# Patient Record
Sex: Male | Born: 1955 | Race: Black or African American | Hispanic: No | Marital: Married | Smoking: Former smoker
Health system: Southern US, Community
[De-identification: ages and names within clinical notes are randomized; demographics above are authoritative.]

## PROBLEM LIST (undated history)

## (undated) DIAGNOSIS — M1990 Unspecified osteoarthritis, unspecified site: Secondary | ICD-10-CM

## (undated) DIAGNOSIS — F32A Depression, unspecified: Secondary | ICD-10-CM

## (undated) DIAGNOSIS — G4730 Sleep apnea, unspecified: Secondary | ICD-10-CM

## (undated) DIAGNOSIS — D496 Neoplasm of unspecified behavior of brain: Secondary | ICD-10-CM

## (undated) DIAGNOSIS — E785 Hyperlipidemia, unspecified: Secondary | ICD-10-CM

## (undated) DIAGNOSIS — R51 Headache: Secondary | ICD-10-CM

## (undated) DIAGNOSIS — G40909 Epilepsy, unspecified, not intractable, without status epilepticus: Secondary | ICD-10-CM

## (undated) DIAGNOSIS — R519 Headache, unspecified: Secondary | ICD-10-CM

## (undated) DIAGNOSIS — Q2381 Bicuspid aortic valve: Secondary | ICD-10-CM

## (undated) DIAGNOSIS — R600 Localized edema: Secondary | ICD-10-CM

## (undated) DIAGNOSIS — R569 Unspecified convulsions: Secondary | ICD-10-CM

## (undated) DIAGNOSIS — R011 Cardiac murmur, unspecified: Secondary | ICD-10-CM

## (undated) DIAGNOSIS — E119 Type 2 diabetes mellitus without complications: Secondary | ICD-10-CM

## (undated) DIAGNOSIS — K269 Duodenal ulcer, unspecified as acute or chronic, without hemorrhage or perforation: Secondary | ICD-10-CM

## (undated) DIAGNOSIS — R42 Dizziness and giddiness: Secondary | ICD-10-CM

## (undated) DIAGNOSIS — C801 Malignant (primary) neoplasm, unspecified: Secondary | ICD-10-CM

## (undated) DIAGNOSIS — G629 Polyneuropathy, unspecified: Secondary | ICD-10-CM

## (undated) DIAGNOSIS — J690 Pneumonitis due to inhalation of food and vomit: Secondary | ICD-10-CM

## (undated) DIAGNOSIS — M109 Gout, unspecified: Secondary | ICD-10-CM

## (undated) DIAGNOSIS — I10 Essential (primary) hypertension: Secondary | ICD-10-CM

## (undated) DIAGNOSIS — N189 Chronic kidney disease, unspecified: Secondary | ICD-10-CM

## (undated) DIAGNOSIS — K219 Gastro-esophageal reflux disease without esophagitis: Secondary | ICD-10-CM

## (undated) DIAGNOSIS — R609 Edema, unspecified: Secondary | ICD-10-CM

## (undated) DIAGNOSIS — N171 Acute kidney failure with acute cortical necrosis: Secondary | ICD-10-CM

## (undated) DIAGNOSIS — R410 Disorientation, unspecified: Secondary | ICD-10-CM

## (undated) DIAGNOSIS — J189 Pneumonia, unspecified organism: Secondary | ICD-10-CM

## (undated) DIAGNOSIS — R0600 Dyspnea, unspecified: Secondary | ICD-10-CM

## (undated) DIAGNOSIS — F419 Anxiety disorder, unspecified: Secondary | ICD-10-CM

## (undated) DIAGNOSIS — C189 Malignant neoplasm of colon, unspecified: Secondary | ICD-10-CM

## (undated) DIAGNOSIS — J45909 Unspecified asthma, uncomplicated: Secondary | ICD-10-CM

## (undated) DIAGNOSIS — R350 Frequency of micturition: Secondary | ICD-10-CM

## (undated) HISTORY — PX: NO PAST SURGERIES: SHX2092

## (undated) HISTORY — PX: CARDIAC CATHETERIZATION: SHX172

## (undated) HISTORY — PX: OTHER SURGICAL HISTORY: SHX169

## (undated) HISTORY — DX: Malignant neoplasm of colon, unspecified: C18.9

---

## 1997-11-05 ENCOUNTER — Emergency Department (HOSPITAL_COMMUNITY): Admission: EM | Admit: 1997-11-05 | Discharge: 1997-11-06 | Payer: Self-pay

## 1999-03-20 ENCOUNTER — Emergency Department (HOSPITAL_COMMUNITY): Admission: EM | Admit: 1999-03-20 | Discharge: 1999-03-20 | Payer: Self-pay | Admitting: *Deleted

## 1999-04-03 ENCOUNTER — Emergency Department (HOSPITAL_COMMUNITY): Admission: EM | Admit: 1999-04-03 | Discharge: 1999-04-03 | Payer: Self-pay | Admitting: Emergency Medicine

## 1999-04-03 ENCOUNTER — Encounter: Payer: Self-pay | Admitting: Emergency Medicine

## 2001-06-11 ENCOUNTER — Emergency Department (HOSPITAL_COMMUNITY): Admission: EM | Admit: 2001-06-11 | Discharge: 2001-06-12 | Payer: Self-pay | Admitting: Emergency Medicine

## 2001-06-11 ENCOUNTER — Encounter: Payer: Self-pay | Admitting: Emergency Medicine

## 2002-06-08 ENCOUNTER — Encounter: Payer: Self-pay | Admitting: Emergency Medicine

## 2002-06-08 ENCOUNTER — Emergency Department (HOSPITAL_COMMUNITY): Admission: EM | Admit: 2002-06-08 | Discharge: 2002-06-08 | Payer: Self-pay | Admitting: Emergency Medicine

## 2002-11-27 ENCOUNTER — Emergency Department (HOSPITAL_COMMUNITY): Admission: EM | Admit: 2002-11-27 | Discharge: 2002-11-27 | Payer: Self-pay | Admitting: Emergency Medicine

## 2002-11-28 ENCOUNTER — Encounter: Admission: RE | Admit: 2002-11-28 | Discharge: 2002-11-28 | Payer: Self-pay | Admitting: Emergency Medicine

## 2002-11-28 ENCOUNTER — Encounter: Payer: Self-pay | Admitting: Emergency Medicine

## 2003-05-07 ENCOUNTER — Emergency Department (HOSPITAL_COMMUNITY): Admission: EM | Admit: 2003-05-07 | Discharge: 2003-05-07 | Payer: Self-pay | Admitting: Emergency Medicine

## 2005-03-29 ENCOUNTER — Ambulatory Visit (HOSPITAL_COMMUNITY): Admission: RE | Admit: 2005-03-29 | Discharge: 2005-03-29 | Payer: Self-pay | Admitting: Family Medicine

## 2006-07-08 ENCOUNTER — Emergency Department (HOSPITAL_COMMUNITY): Admission: EM | Admit: 2006-07-08 | Discharge: 2006-07-08 | Payer: Self-pay | Admitting: Emergency Medicine

## 2007-01-27 ENCOUNTER — Emergency Department (HOSPITAL_COMMUNITY): Admission: EM | Admit: 2007-01-27 | Discharge: 2007-01-28 | Payer: Self-pay | Admitting: Emergency Medicine

## 2008-06-22 ENCOUNTER — Emergency Department (HOSPITAL_COMMUNITY): Admission: EM | Admit: 2008-06-22 | Discharge: 2008-06-22 | Payer: Self-pay | Admitting: Emergency Medicine

## 2008-07-29 ENCOUNTER — Inpatient Hospital Stay (HOSPITAL_BASED_OUTPATIENT_CLINIC_OR_DEPARTMENT_OTHER): Admission: RE | Admit: 2008-07-29 | Discharge: 2008-07-29 | Payer: Self-pay | Admitting: Interventional Cardiology

## 2009-06-07 ENCOUNTER — Emergency Department (HOSPITAL_COMMUNITY): Admission: EM | Admit: 2009-06-07 | Discharge: 2009-06-07 | Payer: Self-pay | Admitting: Emergency Medicine

## 2010-07-26 LAB — DIFFERENTIAL
Basophils Relative: 1 % (ref 0–1)
Eosinophils Absolute: 0.2 10*3/uL (ref 0.0–0.7)
Lymphs Abs: 1.8 10*3/uL (ref 0.7–4.0)
Monocytes Relative: 8 % (ref 3–12)
Neutro Abs: 7.6 10*3/uL (ref 1.7–7.7)
Neutrophils Relative %: 72 % (ref 43–77)

## 2010-07-26 LAB — URINALYSIS, ROUTINE W REFLEX MICROSCOPIC
Bilirubin Urine: NEGATIVE
Hgb urine dipstick: NEGATIVE
Ketones, ur: NEGATIVE mg/dL
Nitrite: NEGATIVE
Protein, ur: 30 mg/dL — AB
Urobilinogen, UA: 1 mg/dL (ref 0.0–1.0)

## 2010-07-26 LAB — COMPREHENSIVE METABOLIC PANEL
ALT: 30 U/L (ref 0–53)
Alkaline Phosphatase: 78 U/L (ref 39–117)
BUN: 10 mg/dL (ref 6–23)
CO2: 26 mEq/L (ref 19–32)
Calcium: 8.8 mg/dL (ref 8.4–10.5)
GFR calc non Af Amer: >60 mL/min (ref >60)
Glucose, Bld: 103 mg/dL — ABNORMAL HIGH (ref 70–99)
Total Protein: 7.2 g/dL (ref 6.0–8.3)

## 2010-07-26 LAB — CBC
HCT: 39.5 % (ref 39.0–52.0)
Hemoglobin: 13.4 g/dL (ref 13.0–17.0)
MCHC: 34 g/dL (ref 30.0–36.0)
RBC: 5.06 MIL/uL (ref 4.22–5.81)
RDW: 15.3 % (ref 11.5–15.5)

## 2010-08-25 LAB — CBC
Hemoglobin: 15.1 g/dL (ref 13.0–17.0)
MCHC: 34.9 g/dL (ref 30.0–36.0)
RBC: 5.5 MIL/uL (ref 4.22–5.81)
WBC: 10.8 10*3/uL — ABNORMAL HIGH (ref 4.0–10.5)

## 2010-08-25 LAB — BASIC METABOLIC PANEL
CO2: 26 mEq/L (ref 19–32)
Calcium: 9.4 mg/dL (ref 8.4–10.5)
Creatinine, Ser: 0.96 mg/dL (ref 0.4–1.5)
GFR calc Af Amer: >60 mL/min (ref >60)
Sodium: 140 mEq/L (ref 135–145)

## 2010-08-25 LAB — POCT CARDIAC MARKERS
CKMB, poc: 1.7 ng/mL (ref 1.0–8.0)
Myoglobin, poc: 125 ng/mL (ref 12–200)
Troponin i, poc: <0.05 ng/mL (ref 0.00–0.09)

## 2010-08-25 LAB — DIFFERENTIAL
Lymphocytes Relative: 17 % (ref 12–46)
Lymphs Abs: 1.8 10*3/uL (ref 0.7–4.0)
Monocytes Absolute: 0.9 10*3/uL (ref 0.1–1.0)
Monocytes Relative: 8 % (ref 3–12)
Neutro Abs: 7.6 10*3/uL (ref 1.7–7.7)
Neutrophils Relative %: 71 % (ref 43–77)

## 2010-08-25 LAB — BRAIN NATRIURETIC PEPTIDE: Pro B Natriuretic peptide (BNP): <30 pg/mL (ref 0.0–100.0)

## 2010-08-25 LAB — MAGNESIUM: Magnesium: 2.1 mg/dL (ref 1.5–2.5)

## 2010-09-22 NOTE — Cardiovascular Report (Signed)
NAME:  Thomas [name], Thomas [name] NO.:  [id number]   MEDICAL RECORD NO.:  [id number]          PATIENT TYPE:  OIB   LOCATION:  1961                         FACILITY:  [facility]   PHYSICIAN:  Belva Crome, M.D.   DATE OF BIRTH:  07/27/1955   DATE OF PROCEDURE:  07/29/2008  DATE OF DISCHARGE:  07/29/2008                            CARDIAC CATHETERIZATION   INDICATIONS FOR PROCEDURE:  The patient is 55 years of age, has  untreated hypertension, has recently had an abnormal stress today,  nuclear perfusion study, and has had recurring somewhat atypical chest  pain.  Because of risk factors and abnormal Cardiolite study, the cath  is being done to rule out coronary artery disease.   PROCEDURES PERFORMED:  1. Left heart catheterization.  2. Left ventriculography.  3. Coronary angiography.  4. Intravenous labetalol therapy for hypertension.   DESCRIPTION OF PROCEDURE:  After informed consent, the patient was given  2 mg of intravenous Versed for sedation.  He also required 20 mg of IV  labetalol for elevated blood pressure.  After the administration of  Versed, obvious airway obstruction consistent with obstructive apnea was  noted.  Oxygenation was maintained with oxygen by cannula.   A 4-French sheath was placed in the right femoral artery using the  modified Seldinger technique.  Xylocaine 1% was used for local  anesthesia.  A 4-French A2 multipurpose catheter was used for  hemodynamic recordings, left ventriculography by hand injection, and  native right coronary angiography.  A #4 left Judkins catheter 4-French  was used for coronary angiography.  The patient tolerated the procedure  without complications.   RESULTS:  1. Hemodynamic data:      a.     Aortic pressure 171/96.      b.     Left ventricular pressure 175/36.  2. Left ventriculography.  The left ventricle is normal in size and      demonstrates overall normal contractility.  Ejection fraction is      55%.  No  obvious mitral regurgitations noted.  3. Coronary angiography.      a.     Left main coronary:  The left main coronary artery is       normal.      b.     Left anterior descending coronary:  The LAD arises from the       left main, is transapical, and gives origin to several small       diagonal branches.  The LAD is normal.      c.     Ramus intermedius:  A large bifurcating ramus intermedius       arises from the distal left main and is normal.      d.     Circumflex artery:  Circumflex gives origin to two obtuse       marginal branches.  The second obtuse marginal on the       inferolateral wall trifurcates.  The entire circumflex system is       normal.      e.  Right coronary:  The right coronary artery is normal, and       dominant.   CONCLUSIONS:  1. Normal coronary arteries.  2. Normal left ventricular systolic function.  3. There is significant elevation in the left ventricular end-      diastolic pressure consistent with diastolic heart failure.   PLAN:  Encourage weight loss.  Encourage evaluation for sleep apnea.  Start antihypertensive therapy to treat hypertension, and to decrease  the left ventricular end-diastolic pressure.      Belva Crome, M.D.  Electronically Signed     HWS/MEDQ  D:  07/29/2008  T:  07/29/2008  Job:  [id number]   cc:   Luanne Bras, M.D.

## 2010-12-26 ENCOUNTER — Emergency Department (HOSPITAL_COMMUNITY)
Admission: EM | Admit: 2010-12-26 | Discharge: 2010-12-26 | Discharge status: Against Medical Advice | Payer: No Typology Code available for payment source | Attending: Emergency Medicine | Admitting: Emergency Medicine

## 2011-02-18 LAB — DIFFERENTIAL
Basophils Absolute: 0
Eosinophils Relative: 3
Lymphs Abs: 2.2
Monocytes Relative: 10
Neutrophils Relative %: 68

## 2011-02-18 LAB — CBC
HCT: 39.2
Platelets: 303
RDW: 14.6 — ABNORMAL HIGH
WBC: 11.4 — ABNORMAL HIGH

## 2011-05-15 ENCOUNTER — Emergency Department (HOSPITAL_BASED_OUTPATIENT_CLINIC_OR_DEPARTMENT_OTHER)
Admission: EM | Admit: 2011-05-15 | Discharge: 2011-05-15 | Disposition: A | Discharge status: Home / Self Care | Payer: BC Managed Care – PPO | Attending: Emergency Medicine | Admitting: Emergency Medicine

## 2011-05-15 ENCOUNTER — Ambulatory Visit (HOSPITAL_COMMUNITY)
Admission: RE | Admit: 2011-05-15 | Discharge: 2011-05-15 | Disposition: A | Discharge status: Home / Self Care | Payer: BC Managed Care – PPO | Source: Ambulatory Visit | Attending: Emergency Medicine | Admitting: Emergency Medicine

## 2011-05-15 DIAGNOSIS — M79609 Pain in unspecified limb: Secondary | ICD-10-CM | POA: Insufficient documentation

## 2011-05-15 DIAGNOSIS — M7989 Other specified soft tissue disorders: Secondary | ICD-10-CM | POA: Insufficient documentation

## 2011-05-15 DIAGNOSIS — M79669 Pain in unspecified lower leg: Secondary | ICD-10-CM

## 2011-05-15 HISTORY — DX: Epilepsy, unspecified, not intractable, without status epilepticus: G40.909

## 2011-05-15 LAB — PROTIME-INR
INR: 1 (ref 0.00–1.49)
Prothrombin Time: 13.4 seconds (ref 11.6–15.2)

## 2011-05-15 LAB — BASIC METABOLIC PANEL
Calcium: 9.7 mg/dL (ref 8.4–10.5)
Chloride: 103 mEq/L (ref 96–112)
Creatinine, Ser: 1.1 mg/dL (ref 0.50–1.35)
GFR calc Af Amer: 86 mL/min — ABNORMAL LOW (ref >90)
Sodium: 141 mEq/L (ref 135–145)

## 2011-05-15 LAB — CBC
Hemoglobin: 14.5 g/dL (ref 13.0–17.0)
MCHC: 35.7 g/dL (ref 30.0–36.0)
RDW: 14.9 % (ref 11.5–15.5)
WBC: 11.7 10*3/uL — ABNORMAL HIGH (ref 4.0–10.5)

## 2011-05-15 NOTE — Progress Notes (Signed)
Right lower extremity venous duplex completed.  Preliminary report is negative for DVT in the right leg and left common femoral vein.  No evidence of SVT or a Baker's cyst on the right. Charlaine Dalton 05/15/2011, 1:39 PM

## 2011-05-15 NOTE — ED Notes (Signed)
MD at bedside speaking with pt regarding transfer to [facility] for further evaluation.

## 2011-05-15 NOTE — ED Notes (Signed)
Report called to Jacqlyn Larsen, RN at [facility].  Pt left [facility] stable upon transfer.

## 2011-05-15 NOTE — ED Notes (Signed)
Pt states pain is primarily in calves and radiates to back.

## 2011-05-15 NOTE — ED Notes (Signed)
Secondary Assessment-  Pt reports a one month history of bilateral leg pain that is worse in the calves.  Pain is described as severe at times and seems worse after driving long distance.  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  Pain is also in low back.  Denies SHOB, chest pain, numbness or tingling.  Swelling noted to bilateral LE and palpable pedal pulse noted.

## 2011-05-15 NOTE — ED Provider Notes (Addendum)
History     CSN: [id number]  Arrival date & time 05/15/11  [id number]   First MD Initiated Contact with Patient 05/15/11 [phone]      Chief Complaint  Patient presents with  . Leg Pain   Patient reports intermittent pain in her lower legs. It began one month ago. Sharp, dull, and aching.  He does drive a [place] and states he drives approximately 2000 miles per week. Patient also is overweight.  (2 days. He is noticing increasing pain in his right calf. He's also noticed some swelling. There. He has had no chest pain, no shortness of breath, no fevers. Denies any personal history of blood clots in the past, but his mother is being seen for a blood clot. The pain is moderate. At this time. No redness, swelling or fevers. No nausea or vomiting. (Consider location/radiation/quality/duration/timing/severity/associated sxs/prior treatment) HPI  No past medical history on file.  No past surgical history on file.  No family history on file.  History  Substance Use Topics  . Smoking status: Not on file  . Smokeless tobacco: Not on file  . Alcohol Use: Not on file      Review of Systems  All other systems reviewed and are negative.    Allergies  Review of patient's allergies indicates not on file.  Home Medications  No current outpatient prescriptions on file.  BP 160/76  Pulse 81  Temp(Src) 98 F (36.7 C) (Oral)  Resp 14  Ht 5\' 8"  (1.727 m)  Wt 269 lb (122.018 kg)  BMI 40.90 kg/m2  SpO2 99%  Physical Exam  Nursing note and vitals reviewed. Constitutional: He is oriented to person, place, and time. He appears well-developed and well-nourished. No distress.  HENT:  Head: Normocephalic and atraumatic.  Eyes: Conjunctivae and EOM are normal. Pupils are equal, round, and reactive to light.  Neck: Neck supple.  Cardiovascular: Normal rate and regular rhythm.  Exam reveals no gallop and no friction rub.   No murmur heard. Pulmonary/Chest: Breath sounds normal. He has no wheezes. He  has no rales. He exhibits no tenderness.  Abdominal: Soft. Bowel sounds are normal. He exhibits no distension. There is no tenderness. There is no rebound and no guarding.  Musculoskeletal: Normal range of motion. He exhibits edema and tenderness.       Right lower extremities shows moderate edema in the tibial and calf area. Trace pitting edema. Mild calf tenderness. No cord palpated. No erythema. No crepitus.  Neurological: He is alert and oriented to person, place, and time. No cranial nerve deficit. Coordination normal.  Skin: Skin is warm and dry. No rash noted.  Psychiatric: He has a normal mood and affect.    ED Course  Procedures (including critical care time)  Labs Reviewed - No data to display No results found.   No diagnosis found.    MDM  Pt is seen and examined;  Initial history and physical completed.  Will follow.      9:40 AM Basic labs and INR has been ordered. Vascular Doppler study has been ordered. The patient will need to be transferred to [facility] to facilitate this exam. He can go by private vehicle. I discussed this with the  ED physician at [facility] as he will technically be a transfer. Will finish his ultrasound studies at [facility]. Will hold off on Lovenox at this time.    Marguerette Sheller A. Lauris Poag, MD 05/15/11 0941  9:49 AM  Spoke w/ Candace from [facility].  Will be expecting pt.   Discussed with CDU PA, S. Rogers Blocker who will be expecting pt.   Ladene Allocca A. Lauris Poag, MD 05/15/11 [phone]   Results for orders placed during the hospital encounter of 05/15/11  CBC      Component Value Range   WBC 11.7 (*) 4.0 - 10.5 (K/uL)   RBC 5.59  4.22 - 5.81 (MIL/uL)   Hemoglobin 14.5  13.0 - 17.0 (g/dL)   HCT 40.6  39.0 - 52.0 (%)   MCV 72.6 (*) 78.0 - 100.0 (fL)   MCH 25.9 (*) 26.0 - 34.0 (pg)   MCHC 35.7  30.0 - 36.0 (g/dL)   RDW 14.9  11.5 - 15.5 (%)   Platelets 275  150 - 400 (K/uL)   No results found.    Onnie Hatchel A. Lauris Poag, MD 05/15/11 1007

## 2011-05-15 NOTE — ED Notes (Signed)
MD at bedside. 

## 2011-05-15 NOTE — ED Notes (Signed)
Pt reports intermittent bilateral leg pain that started approximately one month ago and progressively worsening.  Pain is described as sharp, dull and achy.  Pain is worse at the end of the day after driving long distance.

## 2011-10-26 ENCOUNTER — Encounter (HOSPITAL_COMMUNITY): Payer: Self-pay | Admitting: *Deleted

## 2011-10-26 ENCOUNTER — Emergency Department (HOSPITAL_COMMUNITY)
Admission: EM | Admit: 2011-10-26 | Discharge: 2011-10-27 | Disposition: A | Discharge status: Home / Self Care | Payer: Self-pay | Attending: Emergency Medicine | Admitting: Emergency Medicine

## 2011-10-26 DIAGNOSIS — M545 Low back pain, unspecified: Secondary | ICD-10-CM | POA: Insufficient documentation

## 2011-10-26 DIAGNOSIS — S39012A Strain of muscle, fascia and tendon of lower back, initial encounter: Secondary | ICD-10-CM

## 2011-10-26 DIAGNOSIS — X500XXA Overexertion from strenuous movement or load, initial encounter: Secondary | ICD-10-CM | POA: Insufficient documentation

## 2011-10-26 DIAGNOSIS — R269 Unspecified abnormalities of gait and mobility: Secondary | ICD-10-CM | POA: Insufficient documentation

## 2011-10-26 LAB — URINALYSIS, ROUTINE W REFLEX MICROSCOPIC
Ketones, ur: NEGATIVE mg/dL
Leukocytes, UA: NEGATIVE
Nitrite: NEGATIVE
Protein, ur: NEGATIVE mg/dL

## 2011-10-26 NOTE — ED Notes (Signed)
Pt reports rt lower back pain that began yesterday, pt states he could have possibly strained it. Pt denies any urinary symptoms.

## 2011-10-27 MED ORDER — DICLOFENAC SODIUM 75 MG PO TBEC: 75.0000 mg | DELAYED_RELEASE_TABLET | Freq: Two times a day (BID) | ORAL | Status: DC

## 2011-10-27 MED ORDER — KETOROLAC TROMETHAMINE 60 MG/2ML IM SOLN
60.0000 mg | Freq: Once | INTRAMUSCULAR | Status: AC
  Administered 2011-10-27: 60 mg via INTRAMUSCULAR
  Filled 2011-10-27: qty 2

## 2011-10-27 MED ORDER — METHOCARBAMOL 500 MG PO TABS
500.0000 mg | ORAL_TABLET | Freq: Once | ORAL | Status: AC
  Administered 2011-10-27: 500 mg via ORAL
  Filled 2011-10-27: qty 1

## 2011-10-27 MED ORDER — HYDROCODONE-ACETAMINOPHEN 5-325 MG PO TABS: 1.0000 | ORAL_TABLET | Freq: Four times a day (QID) | ORAL | Status: AC | PRN

## 2011-10-27 MED ORDER — CYCLOBENZAPRINE HCL 10 MG PO TABS: 10.0000 mg | ORAL_TABLET | Freq: Two times a day (BID) | ORAL | Status: AC | PRN

## 2011-10-27 NOTE — ED Provider Notes (Signed)
Medical screening examination/treatment/procedure(s) were performed by non-physician practitioner and as supervising physician I was immediately available for consultation/collaboration.   Wynetta Fines, MD 10/27/11 [phone]

## 2011-10-27 NOTE — ED Provider Notes (Signed)
History     CSN: [id number]  Arrival date & time 10/26/11  2040   First MD Initiated Contact with Patient 10/26/11 2314      12:07 AM HPI Patient reports yesterday he was lifting a suitcase and felt a small pull in his right lower back. States short after began severe right lower back pain. Reports painful to walk. No improvement after Advil. Denies urinary symptoms, saddle anesthesias, perineal numbness, abdominal pain, diarrhea, fever. States pain feels like a pulled muscle. Patient is a 56 y.o. male presenting with back pain. The history is provided by the patient.  Back Pain  This is a new problem. The current episode started yesterday. The problem occurs constantly. The problem has been gradually worsening. The pain is associated with lifting heavy objects. The quality of the pain is described as aching. The pain does not radiate. The pain is severe. The symptoms are aggravated by certain positions, bending and twisting. Pertinent negatives include no chest pain, no fever, no numbness, no headaches, no abdominal pain, no bowel incontinence, no perianal numbness, no bladder incontinence, no dysuria, no pelvic pain, no leg pain, no paresthesias, no paresis, no tingling and no weakness. He has tried NSAIDs for the symptoms. The treatment provided no relief.    Past Medical History  Diagnosis Date  . Epilepsy     Past Surgical History  Procedure Date  . Cardiac catheterization     History reviewed. No pertinent family history.  History  Substance Use Topics  . Smoking status: Never Smoker   . Smokeless tobacco: Never Used  . Alcohol Use: No      Review of Systems  Constitutional: Negative for fever and chills.  HENT: Negative for neck pain.   Respiratory: Negative for cough and shortness of breath.   Cardiovascular: Negative for chest pain and palpitations.  Gastrointestinal: Negative for nausea, vomiting, abdominal pain and bowel incontinence.  Genitourinary: Negative for  bladder incontinence, dysuria, hematuria, flank pain and pelvic pain.  Musculoskeletal: Positive for back pain. Negative for myalgias and gait problem.       Denies saddle anesthesias, perineal numbness, bowel incontinence, urinary incontinence  Neurological: Negative for dizziness, tingling, weakness, numbness, headaches and paresthesias.  All other systems reviewed and are negative.    Allergies  Review of patient's allergies indicates no known allergies.  Home Medications   Current Outpatient Rx  Name Route Sig Dispense Refill  . IBUPROFEN 200 MG PO TABS Oral Take 200 mg by mouth every 6 (six) hours as needed. For pain      BP 144/72  Pulse 85  Temp 97.9 F (36.6 C) (Oral)  Resp 20  SpO2 99%  Physical Exam  Constitutional: He is oriented to person, place, and time. He appears well-developed and well-nourished.  HENT:  Head: Normocephalic and atraumatic.  Eyes: Conjunctivae are normal. Pupils are equal, round, and reactive to light.  Neck: Normal range of motion. Neck supple.  Cardiovascular: Normal rate, regular rhythm and normal heart sounds.   Pulmonary/Chest: Effort normal and breath sounds normal.  Abdominal: Soft. Bowel sounds are normal.  Musculoskeletal:       Back:  Neurological: He is alert and oriented to person, place, and time.  Skin: Skin is warm and dry. No rash noted. No erythema. No pallor.  Psychiatric: He has a normal mood and affect. His behavior is normal.    ED Course  Procedures  Results for orders placed during the hospital encounter of 10/26/11  URINALYSIS, ROUTINE W  REFLEX MICROSCOPIC      Component Value Range   Color, Urine YELLOW  YELLOW   APPearance CLEAR  CLEAR   Specific Gravity, Urine 1.031 (*) 1.005 - 1.030   pH 6.0  5.0 - 8.0   Glucose, UA NEGATIVE  NEGATIVE mg/dL   Hgb urine dipstick NEGATIVE  NEGATIVE   Bilirubin Urine NEGATIVE  NEGATIVE   Ketones, ur NEGATIVE  NEGATIVE mg/dL   Protein, ur NEGATIVE  NEGATIVE mg/dL    Urobilinogen, UA 1.0  0.0 - 1.0 mg/dL   Nitrite NEGATIVE  NEGATIVE   Leukocytes, UA NEGATIVE  NEGATIVE     . MDM    Give patient Toradol IM and Robaxin in emergency department. Advise rest, warm compresses, back exercises, and weight loss. Will prescribe muscle relaxants anti-inflammatory medication and pain medication. Patient voices understanding and is ready for discharge. UA was negative and I do not feel lumbar x-rays are necessary since patient has not had a traumatic injury.      Sheliah Mends, PA-C 10/27/11 [phone]

## 2011-10-27 NOTE — Discharge Instructions (Signed)
Back Exercises Back exercises help treat and prevent back injuries. The goal of back exercises is to increase the strength of your abdominal and back muscles and the flexibility of your back. These exercises should be started when you no longer have back pain. Back exercises include:  Pelvic Tilt. Lie on your back with your knees bent. Tilt your pelvis until the lower part of your back is against the floor. Hold this position 5 to 10 sec and repeat 5 to 10 times.   Knee to Chest. Pull first 1 knee up against your chest and hold for 20 to 30 seconds, repeat this with the other knee, and then both knees. This may be done with the other leg straight or bent, whichever feels better.   Sit-Ups or Curl-Ups. Bend your knees 90 degrees. Start with tilting your pelvis, and do a partial, slow sit-up, lifting your trunk only 30 to 45 degrees off the floor. Take at least 2 to 3 seconds for each sit-up. Do not do sit-ups with your knees out straight. If partial sit-ups are difficult, simply do the above but with only tightening your abdominal muscles and holding it as directed.   Hip-Lift. Lie on your back with your knees flexed 90 degrees. Push down with your feet and shoulders as you raise your hips a couple inches off the floor; hold for 10 seconds, repeat 5 to 10 times.   Back arches. Lie on your stomach, propping yourself up on bent elbows. Slowly press on your hands, causing an arch in your low back. Repeat 3 to 5 times. Any initial stiffness and discomfort should lessen with repetition over time.   Shoulder-Lifts. Lie face down with arms beside your body. Keep hips and torso pressed to floor as you slowly lift your head and shoulders off the floor.  Do not overdo your exercises, especially in the beginning. Exercises may cause you some mild back discomfort which lasts for a few minutes; however, if the pain is more severe, or lasts for more than 15 minutes, do not continue exercises until you see your  caregiver. Improvement with exercise therapy for back problems is slow.  See your caregivers for assistance with developing a proper back exercise program. Document Released: 06/03/2004 Document Revised: 04/15/2011 Document Reviewed: 04/26/2005 [facility] Patient Information 2012 [place].Back Pain, Adult Low back pain is very common. About 1 in 5 people have back pain.The cause of low back pain is rarely dangerous. The pain often gets better over time.About half of people with a sudden onset of back pain feel better in just 2 weeks. About 8 in 10 people feel better by 6 weeks.  CAUSES Some common causes of back pain include:  Strain of the muscles or ligaments supporting the spine.   Wear and tear (degeneration) of the spinal discs.   Arthritis.   Direct injury to the back.  DIAGNOSIS Most of the time, the direct cause of low back pain is not known.However, back pain can be treated effectively even when the exact cause of the pain is unknown.Answering your caregiver's questions about your overall health and symptoms is one of the most accurate ways to make sure the cause of your pain is not dangerous. If your caregiver needs more information, he or she may order lab work or imaging tests (X-rays or MRIs).However, even if imaging tests show changes in your back, this usually does not require surgery. HOME CARE INSTRUCTIONS For many people, back pain returns.Since low back pain is rarely  dangerous, it is often a condition that people can learn to [facility] their own.   Remain active. It is stressful on the back to sit or stand in one place. Do not sit, drive, or stand in one place for more than 30 minutes at a time. Take short walks on level surfaces as soon as pain allows.Try to increase the length of time you walk each day.   Do not stay in bed.Resting more than 1 or 2 days can delay your recovery.   Do not avoid exercise or work.Your body is made to move.It is not dangerous  to be active, even though your back may hurt.Your back will likely heal faster if you return to being active before your pain is gone.   Pay attention to your body when you bend and lift. Many people have less discomfortwhen lifting if they bend their knees, keep the load close to their bodies,and avoid twisting. Often, the most comfortable positions are those that put less stress on your recovering back.   Find a comfortable position to sleep. Use a firm mattress and lie on your side with your knees slightly bent. If you lie on your back, put a pillow under your knees.   Only take over-the-counter or prescription medicines as directed by your caregiver. Over-the-counter medicines to reduce pain and inflammation are often the most helpful.Your caregiver may prescribe muscle relaxant drugs.These medicines help dull your pain so you can more quickly return to your normal activities and healthy exercise.   Put ice on the injured area.   Put ice in a plastic bag.   Place a towel between your skin and the bag.   Leave the ice on for 15 to 20 minutes, 3 to 4 times a day for the first 2 to 3 days. After that, ice and heat may be alternated to reduce pain and spasms.   Ask your caregiver about trying back exercises and gentle massage. This may be of some benefit.   Avoid feeling anxious or stressed.Stress increases muscle tension and can worsen back pain.It is important to recognize when you are anxious or stressed and learn ways to manage it.Exercise is a great option.  SEEK MEDICAL CARE IF:  You have pain that is not relieved with rest or medicine.   You have pain that does not improve in 1 week.   You have new symptoms.   You are generally not feeling well.  SEEK IMMEDIATE MEDICAL CARE IF:   You have pain that radiates from your back into your legs.   You develop new bowel or bladder control problems.   You have unusual weakness or numbness in your arms or legs.   You develop  nausea or vomiting.   You develop abdominal pain.   You feel faint.  Document Released: 04/26/2005 Document Revised: 04/15/2011 Document Reviewed: 09/14/2010 [facility] Patient Information 2012 [place].Lumbosacral Strain Lumbosacral strain is one of the most common causes of back pain. There are many causes of back pain. Most are not serious conditions. CAUSES  Your backbone (spinal column) is made up of 24 main vertebral bodies, the sacrum, and the coccyx. These are held together by muscles and tough, fibrous tissue (ligaments). Nerve roots pass through the openings between the vertebrae. A sudden move or injury to the back may cause injury to, or pressure on, these nerves. This may result in localized back pain or pain movement (radiation) into the buttocks, down the leg, and into the foot. Sharp, shooting pain  from the buttock down the back of the leg (sciatica) is frequently associated with a ruptured (herniated) disk. Pain may be caused by muscle spasm alone. Your caregiver can often find the cause of your pain by the details of your symptoms and an exam. In some cases, you may need tests (such as X-rays). Your caregiver will work with you to decide if any tests are needed based on your specific exam. HOME CARE INSTRUCTIONS   Avoid an underactive lifestyle. Active exercise, as directed by your caregiver, is your greatest weapon against back pain.   Avoid hard physical activities (tennis, racquetball, waterskiing) if you are not in proper physical condition for it. This may aggravate or create problems.   If you have a back problem, avoid sports requiring sudden body movements. Swimming and walking are generally safer activities.   Maintain good posture.   Avoid becoming overweight (obese).   Use bed rest for only the most extreme, sudden (acute) episode. Your caregiver will help you determine how much bed rest is necessary.   For acute conditions, you may put ice on the injured  area.   Put ice in a plastic bag.   Place a towel between your skin and the bag.   Leave the ice on for 15 to 20 minutes at a time, every 2 hours, or as needed.   After you are improved and more active, it may help to apply heat for 30 minutes before activities.  See your caregiver if you are having pain that lasts longer than expected. Your caregiver can advise appropriate exercises or therapy if needed. With conditioning, most back problems can be avoided. SEEK IMMEDIATE MEDICAL CARE IF:   You have numbness, tingling, weakness, or problems with the use of your arms or legs.   You experience severe back pain not relieved with medicines.   There is a change in bowel or bladder control.   You have increasing pain in any area of the body, including your belly (abdomen).   You notice shortness of breath, dizziness, or feel faint.   You feel sick to your stomach (nauseous), are throwing up (vomiting), or become sweaty.   You notice discoloration of your toes or legs, or your feet get very cold.   Your back pain is getting worse.   You have a fever.  MAKE SURE YOU:   Understand these instructions.   Will watch your condition.   Will get help right away if you are not doing well or get worse.  Document Released: 02/03/2005 Document Revised: 04/15/2011 Document Reviewed: 07/26/2008 [facility] Patient Information 2012 [place].

## 2012-04-18 ENCOUNTER — Emergency Department (HOSPITAL_COMMUNITY): Payer: Self-pay

## 2012-04-18 ENCOUNTER — Encounter (HOSPITAL_COMMUNITY): Payer: Self-pay | Admitting: *Deleted

## 2012-04-18 ENCOUNTER — Emergency Department (HOSPITAL_COMMUNITY)
Admission: EM | Admit: 2012-04-18 | Discharge: 2012-04-18 | Disposition: A | Discharge status: Home / Self Care | Payer: Self-pay | Attending: Emergency Medicine | Admitting: Emergency Medicine

## 2012-04-18 DIAGNOSIS — I10 Essential (primary) hypertension: Secondary | ICD-10-CM | POA: Insufficient documentation

## 2012-04-18 DIAGNOSIS — Z9889 Other specified postprocedural states: Secondary | ICD-10-CM | POA: Insufficient documentation

## 2012-04-18 DIAGNOSIS — G40909 Epilepsy, unspecified, not intractable, without status epilepticus: Secondary | ICD-10-CM | POA: Insufficient documentation

## 2012-04-18 DIAGNOSIS — E119 Type 2 diabetes mellitus without complications: Secondary | ICD-10-CM | POA: Insufficient documentation

## 2012-04-18 DIAGNOSIS — R0789 Other chest pain: Secondary | ICD-10-CM | POA: Insufficient documentation

## 2012-04-18 HISTORY — DX: Type 2 diabetes mellitus without complications: E11.9

## 2012-04-18 HISTORY — DX: Essential (primary) hypertension: I10

## 2012-04-18 LAB — BASIC METABOLIC PANEL
CO2: 28 mEq/L (ref 19–32)
Calcium: 9.3 mg/dL (ref 8.4–10.5)
Chloride: 102 mEq/L (ref 96–112)
Glucose, Bld: 114 mg/dL — ABNORMAL HIGH (ref 70–99)
Potassium: 4 mEq/L (ref 3.5–5.1)
Sodium: 137 mEq/L (ref 135–145)

## 2012-04-18 LAB — CBC WITH DIFFERENTIAL/PLATELET
Basophils Relative: 1 % (ref 0–1)
Eosinophils Relative: 4 % (ref 0–5)
Hemoglobin: 14.4 g/dL (ref 13.0–17.0)
MCH: 26.8 pg (ref 26.0–34.0)
Monocytes Absolute: 1 10*3/uL (ref 0.1–1.0)
Neutrophils Relative %: 68 % (ref 43–77)
RBC: 5.37 MIL/uL (ref 4.22–5.81)

## 2012-04-18 LAB — PRO B NATRIURETIC PEPTIDE: Pro B Natriuretic peptide (BNP): 37.3 pg/mL (ref 0–125)

## 2012-04-18 LAB — D-DIMER, QUANTITATIVE: D-Dimer, Quant: <0.27 ug/mL-FEU (ref 0.00–0.48)

## 2012-04-18 MED ORDER — ONDANSETRON HCL 4 MG/2ML IJ SOLN
4.0000 mg | Freq: Once | INTRAMUSCULAR | Status: AC
  Administered 2012-04-18: 4 mg via INTRAVENOUS
  Filled 2012-04-18: qty 2

## 2012-04-18 MED ORDER — HYDROCODONE-ACETAMINOPHEN 5-325 MG PO TABS: 1.0000 | ORAL_TABLET | ORAL | Status: DC | PRN

## 2012-04-18 MED ORDER — ASPIRIN 81 MG PO CHEW
324.0000 mg | CHEWABLE_TABLET | Freq: Once | ORAL | Status: AC
  Administered 2012-04-18: 324 mg via ORAL
  Filled 2012-04-18: qty 4

## 2012-04-18 MED ORDER — MORPHINE SULFATE 4 MG/ML IJ SOLN
4.0000 mg | Freq: Once | INTRAMUSCULAR | Status: AC
  Administered 2012-04-18: 4 mg via INTRAVENOUS
  Filled 2012-04-18: qty 1

## 2012-04-18 NOTE — ED Notes (Signed)
PA at bedside Pt alert and oriented x4. Respirations even and unlabored, bilateral symmetrical rise and fall of chest. Skin warm and dry. In no acute distress. Denies needs.   

## 2012-04-18 NOTE — ED Provider Notes (Signed)
Medical screening examination/treatment/procedure(s) were performed by non-physician practitioner and as supervising physician I was immediately available for consultation/collaboration.  Jasper Riling. Alvino Chapel, MD 04/18/12 [phone]

## 2012-04-18 NOTE — ED Provider Notes (Signed)
History     CSN: [id number]  Arrival date & time 04/18/12  0535   First MD Initiated Contact with Patient 04/18/12 [phone]      Chief Complaint  Patient presents with  . Chest Pain    (Consider location/radiation/quality/duration/timing/severity/associated sxs/prior treatment) HPI  Pt to the ER with complaitns of chest tightness for 2 weeks on and off. He admits that certain positions make it hurt worse and that he can not lay on his left side. It also hurts when you push on his left shoulder. He denies having difficulty lying flat. He denies having done anything to aggravate his muscles. 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 His PMH is positive for borderline diabetes (no medications) and hypertension (does not take medications). He stopped his meds because he did not like they way they make him feel. He has not had any N/V/D or dizziness. nad vss, pt currently having pain. 4/10  Past Medical History  Diagnosis Date  . Epilepsy   . Hypertension   . Diabetes mellitus without complication     Past Surgical History  Procedure Date  . Cardiac catheterization     No family history on file.  History  Substance Use Topics  . Smoking status: Never Smoker   . Smokeless tobacco: Never Used  . Alcohol Use: No      Review of Systems  Unable to perform ROS Cardiovascular: Positive for chest pain.   Allergies  Review of patient's allergies indicates no known allergies.  Home Medications   Current Outpatient Rx  Name  Route  Sig  Dispense  Refill  . IBUPROFEN 200 MG PO TABS   Oral   Take 200 mg by mouth every 6 (six) hours as needed. For pain         . HYDROCODONE-ACETAMINOPHEN 5-325 MG PO TABS   Oral   Take 1 tablet by mouth every 4 (four) hours as needed for pain.   12 tablet   0     BP 152/84  Pulse 76  Temp 98.5 F (36.9 C)  Resp 16  SpO2 95%  Physical Exam  Nursing note and vitals reviewed. Constitutional: He appears well-developed and  well-nourished. No distress.  HENT:  Head: Normocephalic and atraumatic.  Eyes: Pupils are equal, round, and reactive to light.  Neck: Normal range of motion. Neck supple.  Cardiovascular: Normal rate and regular rhythm.   Pulmonary/Chest: Effort normal. Chest wall is not dull to percussion. He exhibits tenderness. He exhibits no mass, no bony tenderness, no laceration, no crepitus, no edema, no deformity, no swelling and no retraction.    Abdominal: Soft.  Neurological: He is alert.  Skin: Skin is warm and dry.    ED Course  Procedures (including critical care time)  Labs Reviewed  CBC WITH DIFFERENTIAL - Abnormal; Notable for the following:    MCV 74.3 (*)     MCHC 36.1 (*)     All other components within normal limits  BASIC METABOLIC PANEL - Abnormal; Notable for the following:    Glucose, Bld 114 (*)     All other components within normal limits  TROPONIN I  PRO B NATRIURETIC PEPTIDE  D-DIMER, QUANTITATIVE   Dg Chest 2 View  04/18/2012  *RADIOLOGY REPORT*  Clinical Data: Left upper chest pain and heaviness.  CHEST - 2 VIEW  Comparison: 06/22/2008  Findings: The heart size and pulmonary vascularity are normal. The lungs appear clear and expanded without focal air  space disease or consolidation. No blunting of the costophrenic angles.  No pneumothorax.  Mediastinal contours appear intact.  No significant change since previous study.  IMPRESSION: No evidence of active pulmonary disease.   Original Report Authenticated By: Lucienne Capers, M.D.      1. Musculoskeletal chest pain   2. Hypertension       MDM   Date: 04/18/2012  Rate: 80  Rhythm: normal sinus rhythm  QRS Axis: normal  Intervals: normal  ST/T Wave abnormalities: normal  Conduction Disutrbances:none  Narrative Interpretation:   Old EKG Reviewed: none available   Symptoms are consistent with musculoskeletal pathology. He had a normal Cath by Dr. Daneen Schick done 3 years ago on review of his  chart.  His pain has been persisting for days. His Trop and d-dimers are negative. Neg chest xray.  Will refer to Cardiology for outpatient follow-up and stress test.  Pt given a work note for a couple of days off and some pain medication.  Discussed case with Dr. Alvino Chapel prior to discharge.  Pt has been advised of the symptoms that warrant their return to the ED. Patient has voiced understanding and has agreed to follow-up with the PCP or specialist.          Linus Mako, [place] 04/18/12 [phone]

## 2012-04-18 NOTE — ED Notes (Signed)
Pt c/o chest tightness x 2 wks off and on; tonight chest feels tight; legs feel heavy; increased swelling lower legs; headache; not taking bp meds;

## 2012-04-18 NOTE — ED Notes (Signed)
Pt escorted to discharge window. Pt verbalized understanding discharge instructions. In no acute distress.  

## 2012-04-18 NOTE — ED Notes (Signed)
Pt alert and oriented x4. Respirations even and unlabored, bilateral symmetrical rise and fall of chest. Skin warm and dry. In no acute distress. Denies needs.   

## 2013-08-03 ENCOUNTER — Emergency Department (HOSPITAL_COMMUNITY)
Admission: EM | Admit: 2013-08-03 | Discharge: 2013-08-03 | Disposition: A | Discharge status: Home / Self Care | Payer: Managed Care, Other (non HMO) | Attending: Emergency Medicine | Admitting: Emergency Medicine

## 2013-08-03 ENCOUNTER — Encounter (HOSPITAL_COMMUNITY): Payer: Self-pay | Admitting: Emergency Medicine

## 2013-08-03 DIAGNOSIS — E119 Type 2 diabetes mellitus without complications: Secondary | ICD-10-CM | POA: Insufficient documentation

## 2013-08-03 DIAGNOSIS — I10 Essential (primary) hypertension: Secondary | ICD-10-CM | POA: Insufficient documentation

## 2013-08-03 DIAGNOSIS — M7710 Lateral epicondylitis, unspecified elbow: Secondary | ICD-10-CM | POA: Insufficient documentation

## 2013-08-03 DIAGNOSIS — Z9889 Other specified postprocedural states: Secondary | ICD-10-CM | POA: Insufficient documentation

## 2013-08-03 DIAGNOSIS — Z8669 Personal history of other diseases of the nervous system and sense organs: Secondary | ICD-10-CM | POA: Insufficient documentation

## 2013-08-03 MED ORDER — NAPROXEN 500 MG PO TABS: 500.0000 mg | ORAL_TABLET | Freq: Two times a day (BID) | ORAL | Status: DC

## 2013-08-03 MED ORDER — OXYCODONE-ACETAMINOPHEN 5-325 MG PO TABS: 1.0000 | ORAL_TABLET | ORAL | Status: DC | PRN

## 2013-08-03 NOTE — ED Notes (Addendum)
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

## 2013-08-03 NOTE — Discharge Instructions (Signed)
Lateral Epicondylitis   Lateral epicondylitis involves inflammation and pain around the outer portion of the elbow. The pain is caused by inflammation of the tendons in the forearm that bring back (extend) the wrist. s. However, it may occur in any individual who extends the wrist repetitively. If lateral epicondylitis is left untreated, it may become a chronic problem. SYMPTOMS   Pain, tenderness, and inflammation on the outer (lateral) side of the elbow.  Pain or weakness with gripping activities.  Pain that increases with wrist twisting motions (playing tennis, opening a door or a jar).  Pain with lifting objects, including a coffee cup. CAUSES  Lateral epicondylitis is caused by inflammation of the tendons that extend the wrist. Causes of injury may include:  Repetitive stress and strain on the muscles and tendons that extend the wrist.  Sudden change in activity level or intensity.  Incorrect grip in racquet sports. RISK INCREASES WITH:  Sports or occupations that require repetitive and/or strenuous forearm and wrist movements   Poor wrist and forearm strength and flexibility.  Failure to warm up properly before activity.  Resuming activity before healing, rehabilitation, and conditioning are complete. PREVENTION   Warm up and stretch properly before activity.  Maintain physical fitness:  Strength, flexibility, and endurance.  Cardiovascular fitness.  Wear and use properly fitted equipment.  Learn and use proper technique and have a coach correct improper technique.  Wear a tennis elbow (counterforce) brace. PROGNOSIS  The course of this condition depends on the degree of the injury. If treated properly, acute cases (symptoms lasting less than 4 weeks) are often resolved in 2 to 6 weeks. Chronic (longer lasting cases) often resolve in 3 to 6 months, but may require physical therapy. RELATED COMPLICATIONS   Frequently recurring symptoms, resulting in a chronic  problem. Properly treating the problem the first time decreases frequency of recurrence.  Chronic inflammation, scarring tendon degeneration, and partial tendon tear, requiring surgery.  Delayed healing or resolution of symptoms. TREATMENT  Treatment first involves the use of ice and medicine, to reduce pain and inflammation. Strengthening and stretching exercises may help reduce discomfort, if performed regularly. These exercises may be performed at home, if the condition is an acute injury. Chronic cases may require a referral to a physical therapist for evaluation and treatment. Your caregiver may advise a corticosteroid injection, to help reduce inflammation. Rarely, surgery is needed. MEDICATION  If pain medicine is needed, nonsteroidal anti-inflammatory medicines (aspirin and ibuprofen), or other minor pain relievers (acetaminophen), are often advised.  Do not take pain medicine for 7 days before surgery.  Prescription pain relievers may be given, if your caregiver thinks they are needed. Use only as directed and only as much as you need.  Corticosteroid injections may be recommended. These injections should be reserved only for the most severe cases, because they can only be given a certain number of times. HEAT AND COLD  Cold treatment (icing) should be applied for 10 to 15 minutes every 2 to 3 hours for inflammation and pain, and immediately after activity that aggravates your symptoms. Use ice packs or an ice massage.  Heat treatment may be used before performing stretching and strengthening activities prescribed by your caregiver, physical therapist, or athletic trainer. Use a heat pack or a warm water soak. SEEK MEDICAL CARE IF: Symptoms get worse or do not improve in 2 weeks, despite treatment.

## 2013-08-09 NOTE — ED Provider Notes (Signed)
CSN: [id number]     Arrival date & time 08/03/13  [id number] History   First MD Initiated Contact with Patient 08/03/13 0719     Chief Complaint  Patient presents with  . Arm Pain   58 year old male with left arm pain. Pain is in the lateral aspect of the left elbow. Onset about 3 months ago. Fairly constant and slowly progressive. No change in strength. No numbness or tingling. Pain is aching/burning. Worse with movement. No history similar symptoms prior to onset. 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  (Consider location/radiation/quality/duration/timing/severity/associated sxs/prior Treatment) HPI  Past Medical History  Diagnosis Date  . Epilepsy   . Hypertension   . Diabetes mellitus without complication    Past Surgical History  Procedure Laterality Date  . Cardiac catheterization     No family history on file. History  Substance Use Topics  . Smoking status: Never Smoker   . Smokeless tobacco: Never Used  . Alcohol Use: No    Review of Systems  All systems reviewed and negative, other than as noted in HPI.   Allergies  Review of patient's allergies indicates no known allergies.  Home Medications   Current Outpatient Rx  Name  Route  Sig  Dispense  Refill  . naproxen (NAPROSYN) 500 MG tablet   Oral   Take 1 tablet (500 mg total) by mouth 2 (two) times daily with a meal.   30 tablet   0   . oxyCODONE-acetaminophen (PERCOCET/ROXICET) 5-325 MG per tablet   Oral   Take 1-2 tablets by mouth every 4 (four) hours as needed for severe pain.   15 tablet   0    BP 182/99  Pulse 82  Temp(Src) 98.3 F (36.8 C) (Oral)  Resp 18  SpO2 95% Physical Exam  Nursing note and vitals reviewed. Constitutional: He appears well-developed and well-nourished. No distress.  HENT:  Head: Normocephalic and atraumatic.  Eyes: Conjunctivae are normal. Right eye exhibits no discharge. Left eye exhibits no discharge.  Neck: Neck  supple.  Cardiovascular: Normal rate, regular rhythm and normal heart sounds.  Exam reveals no gallop and no friction rub.   No murmur heard. Pulmonary/Chest: Effort normal and breath sounds normal. No respiratory distress.  Abdominal: Soft. He exhibits no distension. There is no tenderness.  Musculoskeletal: He exhibits no edema.  Tenderness over the brachial radialis/lateral epicondyles of the left elbow. No swelling or concerning skin lesions. Range of motion of the elbow actively and passively with little apparent discomfort. Neurovascular intact distally.  Neurological: He is alert.  Skin: Skin is warm and dry.  Psychiatric: He has a normal mood and affect. His behavior is normal. Thought content normal.    ED Course  Procedures (including critical care time) Labs Review Labs Reviewed - No data to display Imaging Review No results found.   EKG Interpretation None      MDM   Final diagnoses:  Lateral epicondylitis  of elbow    58 year old male with elbow pain. Suspect lateral epicondylitis. History of repetitive movements with his work. Plan symptomatic treatment. Return precautions were discussed.    Virgel Manifold, MD 08/09/13 [phone]

## 2013-10-26 ENCOUNTER — Ambulatory Visit: Payer: Self-pay | Admitting: Podiatry

## 2013-11-16 ENCOUNTER — Ambulatory Visit: Payer: Self-pay | Admitting: Podiatry

## 2013-12-31 ENCOUNTER — Emergency Department (HOSPITAL_COMMUNITY): Payer: Worker's Compensation

## 2013-12-31 ENCOUNTER — Emergency Department (HOSPITAL_COMMUNITY)
Admission: EM | Admit: 2013-12-31 | Discharge: 2013-12-31 | Disposition: A | Discharge status: Home / Self Care | Payer: Worker's Compensation | Attending: Emergency Medicine | Admitting: Emergency Medicine

## 2013-12-31 ENCOUNTER — Encounter (HOSPITAL_COMMUNITY): Payer: Self-pay | Admitting: Emergency Medicine

## 2013-12-31 DIAGNOSIS — M25561 Pain in right knee: Secondary | ICD-10-CM

## 2013-12-31 DIAGNOSIS — S8990XA Unspecified injury of unspecified lower leg, initial encounter: Secondary | ICD-10-CM | POA: Insufficient documentation

## 2013-12-31 DIAGNOSIS — Y92410 Unspecified street and highway as the place of occurrence of the external cause: Secondary | ICD-10-CM | POA: Insufficient documentation

## 2013-12-31 DIAGNOSIS — E119 Type 2 diabetes mellitus without complications: Secondary | ICD-10-CM | POA: Diagnosis not present

## 2013-12-31 DIAGNOSIS — IMO0002 Reserved for concepts with insufficient information to code with codable children: Secondary | ICD-10-CM | POA: Diagnosis not present

## 2013-12-31 DIAGNOSIS — T148XXA Other injury of unspecified body region, initial encounter: Secondary | ICD-10-CM | POA: Insufficient documentation

## 2013-12-31 DIAGNOSIS — Z8669 Personal history of other diseases of the nervous system and sense organs: Secondary | ICD-10-CM | POA: Insufficient documentation

## 2013-12-31 DIAGNOSIS — S99929A Unspecified injury of unspecified foot, initial encounter: Principal | ICD-10-CM

## 2013-12-31 DIAGNOSIS — E669 Obesity, unspecified: Secondary | ICD-10-CM | POA: Diagnosis not present

## 2013-12-31 DIAGNOSIS — M545 Low back pain, unspecified: Secondary | ICD-10-CM

## 2013-12-31 DIAGNOSIS — Z79899 Other long term (current) drug therapy: Secondary | ICD-10-CM | POA: Diagnosis not present

## 2013-12-31 DIAGNOSIS — I10 Essential (primary) hypertension: Secondary | ICD-10-CM | POA: Diagnosis not present

## 2013-12-31 DIAGNOSIS — Y9389 Activity, other specified: Secondary | ICD-10-CM | POA: Insufficient documentation

## 2013-12-31 DIAGNOSIS — S99919A Unspecified injury of unspecified ankle, initial encounter: Principal | ICD-10-CM

## 2013-12-31 MED ORDER — IBUPROFEN 800 MG PO TABS
800.0000 mg | ORAL_TABLET | Freq: Once | ORAL | Status: AC
  Administered 2013-12-31: 800 mg via ORAL
  Filled 2013-12-31: qty 1

## 2013-12-31 MED ORDER — IBUPROFEN 800 MG PO TABS: 800.0000 mg | ORAL_TABLET | Freq: Three times a day (TID) | ORAL | Status: DC
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
## 2013-12-31 NOTE — ED Provider Notes (Signed)
Medical screening examination/treatment/procedure(s) were performed by non-physician practitioner and as supervising physician I was immediately available for consultation/collaboration.   EKG Interpretation None        Sharyon Cable, MD 12/31/13 [phone]

## 2014-04-09 ENCOUNTER — Emergency Department (HOSPITAL_COMMUNITY): Payer: Managed Care, Other (non HMO)

## 2014-04-09 ENCOUNTER — Inpatient Hospital Stay (HOSPITAL_COMMUNITY)
Admission: EM | Admit: 2014-04-09 | Discharge: 2014-04-12 | DRG: 054 | Disposition: A | Discharge status: Home / Self Care | Payer: Managed Care, Other (non HMO) | Attending: Pulmonary Disease | Admitting: Pulmonary Disease

## 2014-04-09 ENCOUNTER — Other Ambulatory Visit: Payer: Self-pay

## 2014-04-09 ENCOUNTER — Encounter (HOSPITAL_COMMUNITY): Payer: Self-pay | Admitting: *Deleted

## 2014-04-09 DIAGNOSIS — G40409 Other generalized epilepsy and epileptic syndromes, not intractable, without status epilepticus: Secondary | ICD-10-CM | POA: Diagnosis present

## 2014-04-09 DIAGNOSIS — J690 Pneumonitis due to inhalation of food and vomit: Secondary | ICD-10-CM | POA: Diagnosis present

## 2014-04-09 DIAGNOSIS — J9600 Acute respiratory failure, unspecified whether with hypoxia or hypercapnia: Secondary | ICD-10-CM

## 2014-04-09 DIAGNOSIS — E872 Acidosis, unspecified: Secondary | ICD-10-CM

## 2014-04-09 DIAGNOSIS — D72829 Elevated white blood cell count, unspecified: Secondary | ICD-10-CM | POA: Diagnosis not present

## 2014-04-09 DIAGNOSIS — E119 Type 2 diabetes mellitus without complications: Secondary | ICD-10-CM | POA: Insufficient documentation

## 2014-04-09 DIAGNOSIS — R569 Unspecified convulsions: Secondary | ICD-10-CM

## 2014-04-09 DIAGNOSIS — I63512 Cerebral infarction due to unspecified occlusion or stenosis of left middle cerebral artery: Secondary | ICD-10-CM | POA: Diagnosis not present

## 2014-04-09 DIAGNOSIS — Z87891 Personal history of nicotine dependence: Secondary | ICD-10-CM | POA: Diagnosis not present

## 2014-04-09 DIAGNOSIS — E7220 Disorder of urea cycle metabolism, unspecified: Secondary | ICD-10-CM

## 2014-04-09 DIAGNOSIS — G9340 Encephalopathy, unspecified: Secondary | ICD-10-CM | POA: Diagnosis present

## 2014-04-09 DIAGNOSIS — I10 Essential (primary) hypertension: Secondary | ICD-10-CM | POA: Insufficient documentation

## 2014-04-09 DIAGNOSIS — J9601 Acute respiratory failure with hypoxia: Secondary | ICD-10-CM

## 2014-04-09 DIAGNOSIS — R451 Restlessness and agitation: Secondary | ICD-10-CM | POA: Diagnosis present

## 2014-04-09 DIAGNOSIS — E1100 Type 2 diabetes mellitus with hyperosmolarity without nonketotic hyperglycemic-hyperosmolar coma (NKHHC): Secondary | ICD-10-CM | POA: Insufficient documentation

## 2014-04-09 DIAGNOSIS — E1165 Type 2 diabetes mellitus with hyperglycemia: Secondary | ICD-10-CM | POA: Diagnosis present

## 2014-04-09 DIAGNOSIS — D329 Benign neoplasm of meninges, unspecified: Principal | ICD-10-CM | POA: Diagnosis present

## 2014-04-09 DIAGNOSIS — E1121 Type 2 diabetes mellitus with diabetic nephropathy: Secondary | ICD-10-CM | POA: Insufficient documentation

## 2014-04-09 DIAGNOSIS — T1490XA Injury, unspecified, initial encounter: Secondary | ICD-10-CM | POA: Insufficient documentation

## 2014-04-09 DIAGNOSIS — R410 Disorientation, unspecified: Secondary | ICD-10-CM

## 2014-04-09 DIAGNOSIS — G4733 Obstructive sleep apnea (adult) (pediatric): Secondary | ICD-10-CM | POA: Diagnosis present

## 2014-04-09 DIAGNOSIS — R4182 Altered mental status, unspecified: Secondary | ICD-10-CM

## 2014-04-09 HISTORY — DX: Sleep apnea, unspecified: G47.30

## 2014-04-09 HISTORY — DX: Acute respiratory failure with hypoxia: J96.01

## 2014-04-09 HISTORY — DX: Encephalopathy, unspecified: G93.40

## 2014-04-09 HISTORY — DX: Unspecified convulsions: R56.9

## 2014-04-09 HISTORY — DX: Cerebral infarction due to unspecified occlusion or stenosis of left middle cerebral artery: I63.512

## 2014-04-09 LAB — CBC WITH DIFFERENTIAL/PLATELET
BASOS PCT: 1 % (ref 0–1)
Basophils Absolute: 0.2 10*3/uL — ABNORMAL HIGH (ref 0.0–0.1)
Eosinophils Absolute: 0.6 10*3/uL (ref 0.0–0.7)
Eosinophils Relative: 3 % (ref 0–5)
HEMATOCRIT: 42.1 % (ref 39.0–52.0)
HEMOGLOBIN: 14.5 g/dL (ref 13.0–17.0)
Lymphocytes Relative: 40 % (ref 12–46)
Lymphs Abs: 7.6 10*3/uL — ABNORMAL HIGH (ref 0.7–4.0)
MCH: 27.4 pg (ref 26.0–34.0)
MCHC: 34.4 g/dL (ref 30.0–36.0)
MCV: 79.4 fL (ref 78.0–100.0)
Monocytes Absolute: 1.5 10*3/uL — ABNORMAL HIGH (ref 0.1–1.0)
Monocytes Relative: 8 % (ref 3–12)
NEUTROS PCT: 48 % (ref 43–77)
Neutro Abs: 9 10*3/uL — ABNORMAL HIGH (ref 1.7–7.7)
Platelets: 285 10*3/uL (ref 150–400)
RBC: 5.3 MIL/uL (ref 4.22–5.81)
RDW: 14.6 % (ref 11.5–15.5)
WBC: 18.9 10*3/uL — ABNORMAL HIGH (ref 4.0–10.5)

## 2014-04-09 LAB — COMPREHENSIVE METABOLIC PANEL
ALK PHOS: 93 U/L (ref 39–117)
ALT: 34 U/L (ref 0–53)
AST: 26 U/L (ref 0–37)
Albumin: 3.6 g/dL (ref 3.5–5.2)
Anion gap: 31 — ABNORMAL HIGH (ref 5–15)
BUN: 22 mg/dL (ref 6–23)
CHLORIDE: 98 meq/L (ref 96–112)
CO2: 13 mEq/L — ABNORMAL LOW (ref 19–32)
Calcium: 9.2 mg/dL (ref 8.4–10.5)
Creatinine, Ser: 1.37 mg/dL — ABNORMAL HIGH (ref 0.50–1.35)
GFR calc Af Amer: 65 mL/min — ABNORMAL LOW (ref >90)
GFR calc non Af Amer: 56 mL/min — ABNORMAL LOW (ref >90)
GLUCOSE: 249 mg/dL — AB (ref 70–99)
Potassium: 4.2 mEq/L (ref 3.7–5.3)
SODIUM: 142 meq/L (ref 137–147)
TOTAL PROTEIN: 8.1 g/dL (ref 6.0–8.3)
Total Bilirubin: 0.3 mg/dL (ref 0.3–1.2)

## 2014-04-09 LAB — TROPONIN I: Troponin I: <0.3 ng/mL (ref <0.30)

## 2014-04-09 LAB — URINALYSIS, ROUTINE W REFLEX MICROSCOPIC
BILIRUBIN URINE: NEGATIVE
Glucose, UA: NEGATIVE mg/dL
Ketones, ur: NEGATIVE mg/dL
Leukocytes, UA: NEGATIVE
Nitrite: NEGATIVE
Protein, ur: 100 mg/dL — AB
SPECIFIC GRAVITY, URINE: 1.037 — AB (ref 1.005–1.030)
Urobilinogen, UA: 1 mg/dL (ref 0.0–1.0)
pH: 5.5 (ref 5.0–8.0)

## 2014-04-09 LAB — ETHANOL: Alcohol, Ethyl (B): <11 mg/dL (ref 0–11)

## 2014-04-09 LAB — URINE MICROSCOPIC-ADD ON

## 2014-04-09 LAB — PROCALCITONIN

## 2014-04-09 LAB — I-STAT ARTERIAL BLOOD GAS, ED
Acid-base deficit: 8 mmol/L — ABNORMAL HIGH (ref 0.0–2.0)
Bicarbonate: 20.1 mEq/L (ref 20.0–24.0)
O2 Saturation: 96 %
PH ART: 7.208 — AB (ref 7.350–7.450)
Patient temperature: 98.3
TCO2: 22 mmol/L (ref 0–100)
pCO2 arterial: 50.5 mmHg — ABNORMAL HIGH (ref 35.0–45.0)
pO2, Arterial: 104 mmHg — ABNORMAL HIGH (ref 80.0–100.0)

## 2014-04-09 LAB — AMMONIA: Ammonia: 220 umol/L — ABNORMAL HIGH (ref 11–60)

## 2014-04-09 LAB — RAPID URINE DRUG SCREEN, HOSP PERFORMED
AMPHETAMINES: NOT DETECTED
Barbiturates: NOT DETECTED
Benzodiazepines: NOT DETECTED
Cocaine: NOT DETECTED
Opiates: NOT DETECTED
Tetrahydrocannabinol: NOT DETECTED

## 2014-04-09 LAB — CBG MONITORING, ED: Glucose-Capillary: 230 mg/dL — ABNORMAL HIGH (ref 70–99)

## 2014-04-09 LAB — SALICYLATE LEVEL: Salicylate Lvl: 2.3 mg/dL — ABNORMAL LOW (ref 2.8–20.0)

## 2014-04-09 LAB — CK: Total CK: 266 U/L — ABNORMAL HIGH (ref 7–232)

## 2014-04-09 LAB — I-STAT CG4 LACTIC ACID, ED: Lactic Acid, Venous: 15.77 mmol/L — ABNORMAL HIGH (ref 0.5–2.2)

## 2014-04-09 LAB — LACTIC ACID, PLASMA: Lactic Acid, Venous: 2.7 mmol/L — ABNORMAL HIGH (ref 0.5–2.2)

## 2014-04-09 LAB — ACETAMINOPHEN LEVEL: Acetaminophen (Tylenol), Serum: <15 ug/mL (ref 10–30)

## 2014-04-09 LAB — PRO B NATRIURETIC PEPTIDE: Pro B Natriuretic peptide (BNP): 24.1 pg/mL (ref 0–125)

## 2014-04-09 MED ORDER — HALOPERIDOL LACTATE 5 MG/ML IJ SOLN
5.0000 mg | Freq: Once | INTRAMUSCULAR | Status: AC
  Administered 2014-04-09: 5 mg via INTRAMUSCULAR

## 2014-04-09 MED ORDER — HALOPERIDOL LACTATE 5 MG/ML IJ SOLN
INTRAMUSCULAR | Status: AC
  Filled 2014-04-09: qty 1

## 2014-04-09 MED ORDER — LORAZEPAM 2 MG/ML IJ SOLN
2.0000 mg | Freq: Once | INTRAMUSCULAR | Status: AC
  Administered 2014-04-09: 2 mg via INTRAMUSCULAR

## 2014-04-09 MED ORDER — PROPOFOL 10 MG/ML IV EMUL
INTRAVENOUS | Status: AC
  Administered 2014-04-09: 20 ug/kg/min via INTRAVENOUS
  Filled 2014-04-09: qty 100

## 2014-04-09 MED ORDER — DEXTROSE 5 % IV SOLN
700.0000 mg | Freq: Three times a day (TID) | INTRAVENOUS | Status: DC
  Administered 2014-04-10 – 2014-04-11 (×5): 700 mg via INTRAVENOUS
  Filled 2014-04-09 (×8): qty 14

## 2014-04-09 MED ORDER — SODIUM CHLORIDE 0.9 % IV SOLN
3.0000 g | Freq: Four times a day (QID) | INTRAVENOUS | Status: DC
  Administered 2014-04-10 – 2014-04-11 (×5): 3 g via INTRAVENOUS
  Filled 2014-04-09 (×8): qty 3

## 2014-04-09 MED ORDER — ASPIRIN 300 MG RE SUPP
300.0000 mg | RECTAL | Status: AC
  Administered 2014-04-09: 300 mg via RECTAL
  Filled 2014-04-09: qty 1

## 2014-04-09 MED ORDER — LORAZEPAM 2 MG/ML IJ SOLN
2.0000 mg | Freq: Once | INTRAMUSCULAR | Status: AC
  Administered 2014-04-09: 2 mg via INTRAVENOUS

## 2014-04-09 MED ORDER — SODIUM CHLORIDE 0.9 % IV SOLN: 250.0000 mL | INTRAVENOUS | Status: DC | PRN

## 2014-04-09 MED ORDER — KETAMINE HCL 10 MG/ML IJ SOLN
150.0000 mg | Freq: Once | INTRAMUSCULAR | Status: AC
  Administered 2014-04-09: 150 mg via INTRAVENOUS

## 2014-04-09 MED ORDER — SODIUM CHLORIDE 0.9 % IV SOLN
INTRAVENOUS | Status: DC
  Administered 2014-04-09 – 2014-04-11 (×3): via INTRAVENOUS

## 2014-04-09 MED ORDER — SUCCINYLCHOLINE CHLORIDE 20 MG/ML IJ SOLN
INTRAMUSCULAR | Status: AC
Start: 2014-04-09 — End: 2014-04-10
  Filled 2014-04-09: qty 1

## 2014-04-09 MED ORDER — HEPARIN SODIUM (PORCINE) 5000 UNIT/ML IJ SOLN
5000.0000 [IU] | Freq: Three times a day (TID) | INTRAMUSCULAR | Status: DC
  Administered 2014-04-10 – 2014-04-12 (×8): 5000 [IU] via SUBCUTANEOUS
  Filled 2014-04-09 (×12): qty 1

## 2014-04-09 MED ORDER — IOHEXOL 300 MG/ML  SOLN
100.0000 mL | Freq: Once | INTRAMUSCULAR | Status: AC | PRN
  Administered 2014-04-09: 100 mL via INTRAVENOUS

## 2014-04-09 MED ORDER — FENTANYL CITRATE 0.05 MG/ML IJ SOLN
100.0000 ug | INTRAMUSCULAR | Status: DC | PRN
  Administered 2014-04-10 (×3): 100 ug via INTRAVENOUS
  Filled 2014-04-09 (×3): qty 2

## 2014-04-09 MED ORDER — FAMOTIDINE IN NACL 20-0.9 MG/50ML-% IV SOLN
20.0000 mg | Freq: Two times a day (BID) | INTRAVENOUS | Status: DC
  Administered 2014-04-09 – 2014-04-10 (×3): 20 mg via INTRAVENOUS
  Filled 2014-04-09 (×5): qty 50

## 2014-04-09 MED ORDER — HALOPERIDOL 5 MG PO TABS: 5.0000 mg | ORAL_TABLET | Freq: Once | ORAL | Status: DC

## 2014-04-09 MED ORDER — ASPIRIN 81 MG PO CHEW: 324.0000 mg | CHEWABLE_TABLET | ORAL | Status: AC

## 2014-04-09 MED ORDER — LORAZEPAM 2 MG/ML IJ SOLN
INTRAMUSCULAR | Status: AC
  Filled 2014-04-09: qty 1

## 2014-04-09 MED ORDER — PROPOFOL 10 MG/ML IV EMUL
0.0000 ug/kg/min | INTRAVENOUS | Status: DC
  Administered 2014-04-09: 35 ug/kg/min via INTRAVENOUS
  Administered 2014-04-10 (×2): 25 ug/kg/min via INTRAVENOUS
  Administered 2014-04-10: 35 ug/kg/min via INTRAVENOUS
  Filled 2014-04-09 (×4): qty 100

## 2014-04-09 MED ORDER — ETOMIDATE 2 MG/ML IV SOLN
INTRAVENOUS | Status: AC
  Filled 2014-04-09: qty 20

## 2014-04-09 MED ORDER — SODIUM CHLORIDE 0.9 % IV BOLUS (SEPSIS)
1000.0000 mL | Freq: Once | INTRAVENOUS | Status: AC
  Administered 2014-04-09: 1000 mL via INTRAVENOUS

## 2014-04-09 MED ORDER — KETAMINE HCL 10 MG/ML IJ SOLN
50.0000 mg | Freq: Once | INTRAMUSCULAR | Status: DC
Start: 2014-04-09 — End: 2014-04-09

## 2014-04-09 MED ORDER — ROCURONIUM BROMIDE 50 MG/5ML IV SOLN
INTRAVENOUS | Status: AC
  Filled 2014-04-09: qty 2

## 2014-04-09 MED ORDER — PROPOFOL 10 MG/ML IV EMUL
5.0000 ug/kg/min | Freq: Once | INTRAVENOUS | Status: DC
  Administered 2014-04-09: 20 ug/kg/min via INTRAVENOUS

## 2014-04-09 MED ORDER — KETAMINE HCL 10 MG/ML IJ SOLN
200.0000 mg | Freq: Once | INTRAMUSCULAR | Status: AC
  Administered 2014-04-09: 200 mg via INTRAVENOUS

## 2014-04-09 MED ORDER — LIDOCAINE HCL (CARDIAC) 20 MG/ML IV SOLN
INTRAVENOUS | Status: AC
  Filled 2014-04-09: qty 5

## 2014-04-09 MED ORDER — HALOPERIDOL LACTATE 5 MG/ML IJ SOLN: 10.0000 mg | Freq: Once | INTRAMUSCULAR | Status: DC
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
## 2014-04-09 NOTE — ED Notes (Signed)
MD at bedside. 
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
## 2014-04-09 NOTE — Progress Notes (Signed)
[facility]  Case discussed with neurology, they are concerned with possible acute viral encephalitis; they are requesting an IR guided LP. Will add acycylovir per their request.  Roselie Awkward, MD [facility] Pager: [phone] Cell: [phone] If no response, call [phone]
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
## 2014-04-10 ENCOUNTER — Inpatient Hospital Stay (HOSPITAL_COMMUNITY): Payer: Managed Care, Other (non HMO)

## 2014-04-10 ENCOUNTER — Encounter (HOSPITAL_COMMUNITY): Payer: Self-pay | Admitting: *Deleted

## 2014-04-10 DIAGNOSIS — J690 Pneumonitis due to inhalation of food and vomit: Secondary | ICD-10-CM | POA: Insufficient documentation

## 2014-04-10 LAB — BASIC METABOLIC PANEL
ANION GAP: 14 (ref 5–15)
BUN: 19 mg/dL (ref 6–23)
CHLORIDE: 101 meq/L (ref 96–112)
CO2: 24 meq/L (ref 19–32)
Calcium: 8.4 mg/dL (ref 8.4–10.5)
Creatinine, Ser: 1.18 mg/dL (ref 0.50–1.35)
GFR calc Af Amer: 77 mL/min — ABNORMAL LOW (ref >90)
GFR calc non Af Amer: 67 mL/min — ABNORMAL LOW (ref >90)
Glucose, Bld: 132 mg/dL — ABNORMAL HIGH (ref 70–99)
Potassium: 4.2 mEq/L (ref 3.7–5.3)
SODIUM: 139 meq/L (ref 137–147)

## 2014-04-10 LAB — MRSA PCR SCREENING: MRSA BY PCR: NEGATIVE

## 2014-04-10 LAB — GLUCOSE, CAPILLARY
GLUCOSE-CAPILLARY: 120 mg/dL — AB (ref 70–99)
GLUCOSE-CAPILLARY: 161 mg/dL — AB (ref 70–99)
Glucose-Capillary: 126 mg/dL — ABNORMAL HIGH (ref 70–99)
Glucose-Capillary: 105 mg/dL — ABNORMAL HIGH (ref 70–99)
Glucose-Capillary: 133 mg/dL — ABNORMAL HIGH (ref 70–99)

## 2014-04-10 LAB — CBC
HCT: 36.7 % — ABNORMAL LOW (ref 39.0–52.0)
HEMOGLOBIN: 12.9 g/dL — AB (ref 13.0–17.0)
MCH: 26.5 pg (ref 26.0–34.0)
MCHC: 35.1 g/dL (ref 30.0–36.0)
MCV: 75.4 fL — ABNORMAL LOW (ref 78.0–100.0)
PLATELETS: 233 10*3/uL (ref 150–400)
RBC: 4.87 MIL/uL (ref 4.22–5.81)
RDW: 14.5 % (ref 11.5–15.5)
WBC: 18.5 10*3/uL — ABNORMAL HIGH (ref 4.0–10.5)

## 2014-04-10 LAB — LACTIC ACID, PLASMA: Lactic Acid, Venous: 1.7 mmol/L (ref 0.5–2.2)

## 2014-04-10 LAB — HEMOGLOBIN A1C
Hgb A1c MFr Bld: 6.3 % — ABNORMAL HIGH (ref <5.7)
Mean Plasma Glucose: 134 mg/dL — ABNORMAL HIGH (ref <117)

## 2014-04-10 LAB — TRIGLYCERIDES
TRIGLYCERIDES: 983 mg/dL — AB (ref <150)
Triglycerides: 119 mg/dL (ref <150)

## 2014-04-10 LAB — PATHOLOGIST SMEAR REVIEW

## 2014-04-10 LAB — AMMONIA: Ammonia: 27 umol/L (ref 11–60)

## 2014-04-10 LAB — PROCALCITONIN: Procalcitonin: 0.29 ng/mL

## 2014-04-10 MED ORDER — GADOBENATE DIMEGLUMINE 529 MG/ML IV SOLN
20.0000 mL | Freq: Once | INTRAVENOUS | Status: AC | PRN
  Administered 2014-04-10: 20 mL via INTRAVENOUS

## 2014-04-10 MED ORDER — INSULIN ASPART 100 UNIT/ML ~~LOC~~ SOLN
0.0000 [IU] | SUBCUTANEOUS | Status: DC
  Administered 2014-04-10: 3 [IU] via SUBCUTANEOUS

## 2014-04-10 MED ORDER — CETYLPYRIDINIUM CHLORIDE 0.05 % MT LIQD
7.0000 mL | Freq: Four times a day (QID) | OROMUCOSAL | Status: DC
  Administered 2014-04-10 (×2): 7 mL via OROMUCOSAL

## 2014-04-10 MED ORDER — LABETALOL HCL 5 MG/ML IV SOLN
10.0000 mg | INTRAVENOUS | Status: DC | PRN
  Filled 2014-04-10: qty 4

## 2014-04-10 MED ORDER — LEVETIRACETAM IN NACL 1000 MG/100ML IV SOLN
1000.0000 mg | Freq: Once | INTRAVENOUS | Status: AC
  Administered 2014-04-10: 1000 mg via INTRAVENOUS
  Filled 2014-04-10: qty 100

## 2014-04-10 MED ORDER — CHLORHEXIDINE GLUCONATE 0.12 % MT SOLN
15.0000 mL | Freq: Two times a day (BID) | OROMUCOSAL | Status: DC
  Administered 2014-04-10 (×2): 15 mL via OROMUCOSAL
  Filled 2014-04-10 (×2): qty 15

## 2014-04-10 MED ORDER — LEVETIRACETAM IN NACL 500 MG/100ML IV SOLN
500.0000 mg | Freq: Two times a day (BID) | INTRAVENOUS | Status: DC
  Administered 2014-04-10 – 2014-04-11 (×4): 500 mg via INTRAVENOUS
  Filled 2014-04-10 (×7): qty 100

## 2014-04-10 MED ORDER — CETYLPYRIDINIUM CHLORIDE 0.05 % MT LIQD
7.0000 mL | Freq: Two times a day (BID) | OROMUCOSAL | Status: DC
  Administered 2014-04-10 – 2014-04-11 (×4): 7 mL via OROMUCOSAL

## 2014-04-10 MED ORDER — ACETAMINOPHEN 325 MG PO TABS
650.0000 mg | ORAL_TABLET | Freq: Once | ORAL | Status: AC
  Administered 2014-04-10: 650 mg via ORAL
  Filled 2014-04-10: qty 2

## 2014-04-10 MED ORDER — INSULIN ASPART 100 UNIT/ML ~~LOC~~ SOLN
0.0000 [IU] | Freq: Three times a day (TID) | SUBCUTANEOUS | Status: DC
  Administered 2014-04-12: 3 [IU] via SUBCUTANEOUS
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
## 2014-04-10 NOTE — Procedures (Signed)
Extubation Procedure Note  Patient Details:   Name: Thomas [name] DOB: 1956-03-25 MRN: [id number]   Airway Documentation:     Evaluation  O2 sats: stable throughout Complications: No apparent complications Patient did tolerate procedure well. Bilateral Breath Sounds: Clear, Coarse crackles Suctioning: Airway Yes   Patient extubated to [place]. Patient tolerated well. No complications. Vital signs stable at this time. RN at bedside. RT will continue to monitor.  Mcneil Sober 04/10/2014, 2:57 PM

## 2014-04-11 ENCOUNTER — Encounter (HOSPITAL_COMMUNITY): Payer: Self-pay | Admitting: *Deleted

## 2014-04-11 ENCOUNTER — Inpatient Hospital Stay (HOSPITAL_COMMUNITY): Payer: Managed Care, Other (non HMO)

## 2014-04-11 LAB — BASIC METABOLIC PANEL
Anion gap: 10 (ref 5–15)
BUN: 10 mg/dL (ref 6–23)
CALCIUM: 8.2 mg/dL — AB (ref 8.4–10.5)
CHLORIDE: 106 meq/L (ref 96–112)
CO2: 27 mEq/L (ref 19–32)
CREATININE: 0.98 mg/dL (ref 0.50–1.35)
GFR calc Af Amer: >90 mL/min (ref >90)
GFR calc non Af Amer: 90 mL/min — ABNORMAL LOW (ref >90)
GLUCOSE: 109 mg/dL — AB (ref 70–99)
Potassium: 4 mEq/L (ref 3.7–5.3)
Sodium: 143 mEq/L (ref 137–147)

## 2014-04-11 LAB — GLUCOSE, CAPILLARY
GLUCOSE-CAPILLARY: 109 mg/dL — AB (ref 70–99)
GLUCOSE-CAPILLARY: 105 mg/dL — AB (ref 70–99)
Glucose-Capillary: 96 mg/dL (ref 70–99)

## 2014-04-11 LAB — CBC
HCT: 37.3 % — ABNORMAL LOW (ref 39.0–52.0)
HEMOGLOBIN: 12.6 g/dL — AB (ref 13.0–17.0)
MCH: 25.7 pg — ABNORMAL LOW (ref 26.0–34.0)
MCHC: 33.8 g/dL (ref 30.0–36.0)
MCV: 76.1 fL — AB (ref 78.0–100.0)
PLATELETS: 211 10*3/uL (ref 150–400)
RBC: 4.9 MIL/uL (ref 4.22–5.81)
RDW: 14.7 % (ref 11.5–15.5)
WBC: 12.1 10*3/uL — ABNORMAL HIGH (ref 4.0–10.5)

## 2014-04-11 MED ORDER — GUAIFENESIN 100 MG/5ML PO SYRP
100.0000 mg | ORAL_SOLUTION | ORAL | Status: DC | PRN
  Administered 2014-04-12: 100 mg via ORAL
  Filled 2014-04-11 (×3): qty 5

## 2014-04-11 MED ORDER — METFORMIN HCL 500 MG PO TABS
500.0000 mg | ORAL_TABLET | Freq: Every day | ORAL | Status: DC
  Administered 2014-04-12: 500 mg via ORAL
  Filled 2014-04-11 (×2): qty 1

## 2014-04-11 MED ORDER — AMOXICILLIN-POT CLAVULANATE 500-125 MG PO TABS
500.0000 mg | ORAL_TABLET | Freq: Three times a day (TID) | ORAL | Status: DC
  Administered 2014-04-11 – 2014-04-12 (×5): 500 mg via ORAL
  Filled 2014-04-11 (×7): qty 1

## 2014-04-11 MED ORDER — AMLODIPINE BESYLATE 5 MG PO TABS
5.0000 mg | ORAL_TABLET | Freq: Every day | ORAL | Status: DC
  Administered 2014-04-11 – 2014-04-12 (×2): 5 mg via ORAL
  Filled 2014-04-11 (×2): qty 1

## 2014-04-11 MED ORDER — OXYCODONE-ACETAMINOPHEN 5-325 MG PO TABS
1.0000 | ORAL_TABLET | ORAL | Status: DC | PRN
  Administered 2014-04-11 (×2): 1 via ORAL
  Filled 2014-04-11 (×2): qty 1
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
## 2014-04-11 NOTE — Plan of Care (Signed)
Problem: Consults Goal: General Medical Patient Education See Patient Education Module for specific education.  Outcome: Completed/Met Date Met:  04/11/14 Goal: Skin Care Protocol Initiated - if Braden Score 18 or less If consults are not indicated, leave blank or document N/A  Outcome: Completed/Met Date Met:  04/11/14 Goal: Nutrition Consult-if indicated Outcome: Completed/Met Date Met:  04/11/14 Goal: Diabetes Guidelines if Diabetic/Glucose > 140 If diabetic or lab glucose is > 140 mg/dl - Initiate Diabetes/Hyperglycemia Guidelines & Document Interventions  Outcome: Completed/Met Date Met:  04/11/14  Problem: Phase I Progression Outcomes Goal: Pain controlled with appropriate interventions Outcome: Completed/Met Date Met:  04/11/14 Goal: OOB as tolerated unless otherwise ordered Outcome: Progressing Goal: Hemodynamically stable Outcome: Completed/Met Date Met:  04/11/14

## 2014-04-11 NOTE — Plan of Care (Signed)
Problem: Acute Rehab OT Goals (only OT should resolve) Goal: Pt. Will Transfer To Toilet Outcome: Not Applicable Date Met:  33/17/40 Goal: Pt. Will Perform Toileting-Clothing Manipulation Outcome: Not Applicable Date Met:  99/27/80
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
## 2014-04-11 NOTE — Progress Notes (Signed)
Patient ID: Thomas [name], male   DOB: April 01, 1956, 58 y.o.   MRN: [id number] BP 165/77 mmHg  Pulse 93  Temp(Src) 98.7 F (37.1 C) (Oral)  Resp 18  Ht 5\' 8"  (1.727 m)  Wt 141.1 kg (311 lb 1.1 oz)  BMI 47.31 kg/m2  SpO2 95% Alert and oriented x 4, speech is clear and fluent Perrl, full eom Symmetric facial sensation and movement Hearing intact to voice Shoulder shrug is normal. Tongue protrudes in the midline, uvula elevates in the midline I spoke with [name] about the tumor. He understands that removal is not an emergency. He requested a note stating he cannot drive, and I will provide his family with this.  Will continue to follow while in hospital, will make sure he is seen once discharged.

## 2014-04-12 DIAGNOSIS — E1121 Type 2 diabetes mellitus with diabetic nephropathy: Secondary | ICD-10-CM | POA: Insufficient documentation

## 2014-04-12 DIAGNOSIS — I10 Essential (primary) hypertension: Secondary | ICD-10-CM | POA: Insufficient documentation

## 2014-04-12 DIAGNOSIS — T1490XA Injury, unspecified, initial encounter: Secondary | ICD-10-CM | POA: Insufficient documentation

## 2014-04-12 DIAGNOSIS — E1100 Type 2 diabetes mellitus with hyperosmolarity without nonketotic hyperglycemic-hyperosmolar coma (NKHHC): Secondary | ICD-10-CM | POA: Insufficient documentation

## 2014-04-12 DIAGNOSIS — E119 Type 2 diabetes mellitus without complications: Secondary | ICD-10-CM

## 2014-04-12 DIAGNOSIS — T1490 Injury, unspecified: Secondary | ICD-10-CM

## 2014-04-12 LAB — GLUCOSE, CAPILLARY
GLUCOSE-CAPILLARY: 137 mg/dL — AB (ref 70–99)
GLUCOSE-CAPILLARY: 96 mg/dL (ref 70–99)
Glucose-Capillary: 115 mg/dL — ABNORMAL HIGH (ref 70–99)
Glucose-Capillary: 183 mg/dL — ABNORMAL HIGH (ref 70–99)

## 2014-04-12 LAB — BASIC METABOLIC PANEL
ANION GAP: 14 (ref 5–15)
BUN: 9 mg/dL (ref 6–23)
CO2: 24 mEq/L (ref 19–32)
Calcium: 8.9 mg/dL (ref 8.4–10.5)
Chloride: 102 mEq/L (ref 96–112)
Creatinine, Ser: 0.96 mg/dL (ref 0.50–1.35)
GFR calc Af Amer: >90 mL/min (ref >90)
GLUCOSE: 120 mg/dL — AB (ref 70–99)
Potassium: 4.1 mEq/L (ref 3.7–5.3)
SODIUM: 140 meq/L (ref 137–147)

## 2014-04-12 LAB — CBC
HCT: 37.1 % — ABNORMAL LOW (ref 39.0–52.0)
Hemoglobin: 12.9 g/dL — ABNORMAL LOW (ref 13.0–17.0)
MCH: 26.4 pg (ref 26.0–34.0)
MCHC: 34.8 g/dL (ref 30.0–36.0)
MCV: 76 fL — AB (ref 78.0–100.0)
Platelets: 211 10*3/uL (ref 150–400)
RBC: 4.88 MIL/uL (ref 4.22–5.81)
RDW: 14.5 % (ref 11.5–15.5)
WBC: 13 10*3/uL — AB (ref 4.0–10.5)

## 2014-04-12 LAB — CULTURE, RESPIRATORY

## 2014-04-12 LAB — CULTURE, RESPIRATORY W GRAM STAIN

## 2014-04-12 MED ORDER — LEVETIRACETAM 500 MG PO TABS
500.0000 mg | ORAL_TABLET | Freq: Two times a day (BID) | ORAL | Status: DC
  Administered 2014-04-12: 500 mg via ORAL
  Filled 2014-04-12 (×2): qty 1

## 2014-04-12 MED ORDER — AMOXICILLIN-POT CLAVULANATE 500-125 MG PO TABS: 500.0000 mg | ORAL_TABLET | Freq: Three times a day (TID) | ORAL | Status: DC

## 2014-04-12 MED ORDER — AMLODIPINE BESY-BENAZEPRIL HCL 10-20 MG PO CAPS: 1.0000 | ORAL_CAPSULE | Freq: Every day | ORAL | Status: DC

## 2014-04-12 MED ORDER — GUAIFENESIN 100 MG/5ML PO SYRP: 100.0000 mg | ORAL_SOLUTION | ORAL | Status: DC | PRN

## 2014-04-12 MED ORDER — LEVETIRACETAM 500 MG PO TABS: 500.0000 mg | ORAL_TABLET | Freq: Two times a day (BID) | ORAL | Status: DC

## 2014-04-12 MED ORDER — METFORMIN HCL ER 500 MG PO TB24: 500.0000 mg | ORAL_TABLET | Freq: Every day | ORAL | Status: DC

## 2014-04-12 NOTE — Discharge Instructions (Signed)
Meningioma Meningioma is a tumor that occurs in the thin tissue that covers the brain and spinal cord (meninges). Meningiomas are usually benign, which means they are not cancerous and do not spread to other areas. In rare cases, a meningioma may become cancerous (malignant). Older women have a higher risk of having meningiomas. However, men have a higher risk of having a meningioma that is malignant. RISK FACTORS People who have had radiation exposure in the past may have an increased risk of developing this type of tumor. People who have neurofibromatosis 2 may also have an increased risk of meningioma. Older women have a higher risk of meningiomas than men or children. However, men have a higher risk of meningiomas that are malignant. SIGNS AND SYMPTOMS Symptoms of meningioma usually begin very slowly. The symptoms may depend on the size and location of the tumor. Possible symptoms include:   Headaches.  Nausea and vomiting.  Vision changes.  Hearing changes.   Loss of the sense of smell.  Seizures.   Weakness or numbness on one side of the body or in an arm or leg.   Mood changes.   Problems with memory or thinking.  DIAGNOSIS  Brain tumors can usually be seen on brain imaging, such as CT scan or MRI. A sample of the tumor will need to be studied in a laboratory (biopsy) to confirm the diagnosis of meningioma. Information about the tumor cells also helps guide treatment. TREATMENT  Because meningioma is so slow growing, treatment is often delayed until symptoms affect daily activities. Regular monitoring is performed to track the tumor's growth.  There are several ways that meningioma is treated:  Surgery to remove as much of the tumor as possible.  High-energy rays (radiation therapy) to help shrink or kill the tumor.  Chemotherapy to shrink or kill the tumor. Because normal cells may also be killed, chemotherapy has many side effects.  Targeted therapy, using  substances that injure or kill cancer cells without affecting normal cells.  Steroid medicine to decrease brain swelling and improve symptoms. HOME CARE INSTRUCTIONS  Take all medicines as directed by your health care provider.  Go to all follow-up appointments. SEEK MEDICAL CARE IF:  Any symptoms come back.  You have diarrhea, throw up (vomit), or have abdominal pain.  You cannot eat or drink as much as you need.  You are more weak or tired than usual.   You are losing weight without trying. SEEK IMMEDIATE MEDICAL CARE IF:  Your diarrhea, vomiting, or abdominal pain does not go away.  You have new symptoms, such as vision problems or difficulty walking.   You have a seizure.   You have bleeding that does not stop.   You have trouble breathing.   You have a fever.  Document Released: 05/01/2013 Document Reviewed: 05/01/2013 [facility] Patient Information 2015 [place], [place]. This information is not intended to replace advice given to you by your health care provider. Make sure you discuss any questions you have with your health care provider. Seizure, Adult A seizure is abnormal electrical activity in the brain. Seizures usually last from 30 seconds to 2 minutes. There are various types of seizures. Before a seizure, you may have a warning sensation (aura) that a seizure is about to occur. An aura may include the following symptoms:   Fear or anxiety.  Nausea.  Feeling like the room is spinning (vertigo).  Vision changes, such as seeing flashing lights or spots. Common symptoms during a seizure include:  A  change in attention or behavior (altered mental status).  Convulsions with rhythmic jerking movements.  Drooling.  Rapid eye movements.  Grunting.  Loss of bladder and bowel control.  Bitter taste in the mouth.  Tongue biting. After a seizure, you may feel confused and sleepy. You may also have an injury resulting from convulsions during the  seizure. HOME CARE INSTRUCTIONS   If you are given medicines, take them exactly as prescribed by your health care provider.  Keep all follow-up appointments as directed by your health care provider.  Do not swim or drive or engage in risky activity during which a seizure could cause further injury to you or others until your health care provider says it is OK.  Get adequate rest.  Teach friends and family what to do if you have a seizure. They should:  Lay you on the ground to prevent a fall.  Put a cushion under your head.  Loosen any tight clothing around your neck.  Turn you on your side. If vomiting occurs, this helps keep your airway clear.  Stay with you until you recover.  Know whether or not you need emergency care. SEEK IMMEDIATE MEDICAL CARE IF:  The seizure lasts longer than 5 minutes.  The seizure is severe or you do not wake up immediately after the seizure.  You have an altered mental status after the seizure.  You are having more frequent or worsening seizures. Someone should drive you to the emergency department or call local emergency services (911 in [place] MAKE SURE YOU:  Understand these instructions.  Will watch your condition.  Will get help right away if you are not doing well or get worse. Document Released: 04/23/2000 Document Revised: 02/14/2013 Document Reviewed: 12/06/2012 [facility] Patient Information 2015 [place], [place]. This information is not intended to replace advice given to you by your health care provider. Make sure you discuss any questions you have with your health care provider.
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
## 2014-04-12 NOTE — Discharge Planning (Signed)
Patient discharged home in stable condition. Verbalizes understanding of all discharge instructions, including home medications and follow up appointments. 
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
## 2014-04-12 NOTE — Progress Notes (Signed)
Placed patient on CPAP at 6cm

## 2014-04-12 NOTE — Care Management Note (Signed)
Order for CPAP .  Scheduled sleep study at [facility] , first available Monday Feb 22. 2015 . Referral form faxed to [facility] at [phone] . Confirmed patient's contact information with patient .  Ordered CPAP through [facility] . Patient aware. Magdalen Spatz RN BSN

## 2014-04-14 ENCOUNTER — Encounter (HOSPITAL_COMMUNITY): Payer: Self-pay | Admitting: *Deleted

## 2014-04-14 ENCOUNTER — Emergency Department (HOSPITAL_COMMUNITY): Payer: Managed Care, Other (non HMO)

## 2014-04-14 ENCOUNTER — Emergency Department (HOSPITAL_COMMUNITY)
Admission: EM | Admit: 2014-04-14 | Discharge: 2014-04-14 | Disposition: A | Discharge status: Home / Self Care | Payer: Managed Care, Other (non HMO) | Attending: Emergency Medicine | Admitting: Emergency Medicine

## 2014-04-14 ENCOUNTER — Other Ambulatory Visit: Payer: Self-pay

## 2014-04-14 DIAGNOSIS — J4531 Mild persistent asthma with (acute) exacerbation: Secondary | ICD-10-CM | POA: Insufficient documentation

## 2014-04-14 DIAGNOSIS — I10 Essential (primary) hypertension: Secondary | ICD-10-CM | POA: Insufficient documentation

## 2014-04-14 DIAGNOSIS — Z79899 Other long term (current) drug therapy: Secondary | ICD-10-CM | POA: Insufficient documentation

## 2014-04-14 DIAGNOSIS — Z791 Long term (current) use of non-steroidal anti-inflammatories (NSAID): Secondary | ICD-10-CM | POA: Insufficient documentation

## 2014-04-14 DIAGNOSIS — Z8669 Personal history of other diseases of the nervous system and sense organs: Secondary | ICD-10-CM | POA: Diagnosis not present

## 2014-04-14 DIAGNOSIS — J4530 Mild persistent asthma, uncomplicated: Secondary | ICD-10-CM

## 2014-04-14 DIAGNOSIS — R0682 Tachypnea, not elsewhere classified: Secondary | ICD-10-CM | POA: Insufficient documentation

## 2014-04-14 DIAGNOSIS — E119 Type 2 diabetes mellitus without complications: Secondary | ICD-10-CM | POA: Diagnosis not present

## 2014-04-14 DIAGNOSIS — Z9889 Other specified postprocedural states: Secondary | ICD-10-CM | POA: Insufficient documentation

## 2014-04-14 DIAGNOSIS — R0602 Shortness of breath: Secondary | ICD-10-CM | POA: Diagnosis present

## 2014-04-14 LAB — BASIC METABOLIC PANEL
Anion gap: 14 (ref 5–15)
BUN: 8 mg/dL (ref 6–23)
CALCIUM: 9.4 mg/dL (ref 8.4–10.5)
CO2: 25 meq/L (ref 19–32)
CREATININE: 0.97 mg/dL (ref 0.50–1.35)
Chloride: 101 mEq/L (ref 96–112)
GFR calc Af Amer: >90 mL/min (ref >90)
GFR, EST NON AFRICAN AMERICAN: 90 mL/min — AB (ref >90)
GLUCOSE: 129 mg/dL — AB (ref 70–99)
Potassium: 3.9 mEq/L (ref 3.7–5.3)
Sodium: 140 mEq/L (ref 137–147)

## 2014-04-14 LAB — I-STAT TROPONIN, ED: Troponin i, poc: 0.01 ng/mL (ref 0.00–0.08)

## 2014-04-14 LAB — CBC WITH DIFFERENTIAL/PLATELET
BASOS ABS: 0.1 10*3/uL (ref 0.0–0.1)
Basophils Relative: 1 % (ref 0–1)
EOS ABS: 0.6 10*3/uL (ref 0.0–0.7)
EOS PCT: 6 % — AB (ref 0–5)
HEMATOCRIT: 38.8 % — AB (ref 39.0–52.0)
Hemoglobin: 13.7 g/dL (ref 13.0–17.0)
LYMPHS PCT: 15 % (ref 12–46)
Lymphs Abs: 1.6 10*3/uL (ref 0.7–4.0)
MCH: 26.7 pg (ref 26.0–34.0)
MCHC: 35.3 g/dL (ref 30.0–36.0)
MCV: 75.6 fL — AB (ref 78.0–100.0)
MONO ABS: 1.1 10*3/uL — AB (ref 0.1–1.0)
Monocytes Relative: 11 % (ref 3–12)
Neutro Abs: 7.2 10*3/uL (ref 1.7–7.7)
Neutrophils Relative %: 67 % (ref 43–77)
PLATELETS: 242 10*3/uL (ref 150–400)
RBC: 5.13 MIL/uL (ref 4.22–5.81)
RDW: 14.5 % (ref 11.5–15.5)
WBC: 10.5 10*3/uL (ref 4.0–10.5)

## 2014-04-14 LAB — PRO B NATRIURETIC PEPTIDE: Pro B Natriuretic peptide (BNP): 19.9 pg/mL (ref 0–125)

## 2014-04-14 MED ORDER — ALBUTEROL (5 MG/ML) CONTINUOUS INHALATION SOLN
10.0000 mg/h | INHALATION_SOLUTION | RESPIRATORY_TRACT | Status: AC
  Administered 2014-04-14: 10 mg/h via RESPIRATORY_TRACT
  Filled 2014-04-14: qty 20

## 2014-04-14 MED ORDER — METHYLPREDNISOLONE SODIUM SUCC 125 MG IJ SOLR
125.0000 mg | Freq: Once | INTRAMUSCULAR | Status: AC
  Administered 2014-04-14: 125 mg via INTRAVENOUS
  Filled 2014-04-14: qty 2

## 2014-04-14 MED ORDER — ALBUTEROL SULFATE HFA 108 (90 BASE) MCG/ACT IN AERS
1.0000 | INHALATION_SPRAY | RESPIRATORY_TRACT | Status: DC | PRN
  Administered 2014-04-14: 2 via RESPIRATORY_TRACT
  Filled 2014-04-14: qty 6.7

## 2014-04-14 MED ORDER — PREDNISONE 20 MG PO TABS: 40.0000 mg | ORAL_TABLET | Freq: Every day | ORAL | Status: DC

## 2014-04-14 MED ORDER — ALBUTEROL SULFATE HFA 108 (90 BASE) MCG/ACT IN AERS: 1.0000 | INHALATION_SPRAY | RESPIRATORY_TRACT | Status: DC | PRN

## 2014-04-14 MED ORDER — HYDROCODONE-HOMATROPINE 5-1.5 MG/5ML PO SYRP: 5.0000 mL | ORAL_SOLUTION | Freq: Four times a day (QID) | ORAL | Status: DC | PRN

## 2014-04-14 NOTE — ED Notes (Signed)
Pt states that he was seen recently here and was admitted. Pt states that since discharge he has had productive cough, sob, and body aches.

## 2014-04-14 NOTE — ED Notes (Signed)
Pa AT BEDSIDE
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
## 2014-04-14 NOTE — Discharge Instructions (Signed)
Asthma Asthma is a recurring condition in which the airways tighten and narrow. Asthma can make it difficult to breathe. It can cause coughing, wheezing, and shortness of breath. Asthma episodes, also called asthma attacks, range from minor to life-threatening. Asthma cannot be cured, but medicines and lifestyle changes can help control it. CAUSES Asthma is believed to be caused by inherited (genetic) and environmental factors, but its exact cause is unknown. Asthma may be triggered by allergens, lung infections, or irritants in the air. Asthma triggers are different for each person. Common triggers include:   Animal dander.  Dust mites.  Cockroaches.  Pollen from trees or grass.  Mold.  Smoke.  Air pollutants such as dust, household cleaners, hair sprays, aerosol sprays, paint fumes, strong chemicals, or strong odors.  Cold air, weather changes, and winds (which increase molds and pollens in the air).  Strong emotional expressions such as crying or laughing hard.  Stress.  Certain medicines (such as aspirin) or types of drugs (such as beta-blockers).  Sulfites in foods and drinks. Foods and drinks that may contain sulfites include dried fruit, potato chips, and sparkling grape juice.  Infections or inflammatory conditions such as the flu, a cold, or an inflammation of the nasal membranes (rhinitis).  Gastroesophageal reflux disease (GERD).  Exercise or strenuous activity. SYMPTOMS Symptoms may occur immediately after asthma is triggered or many hours later. Symptoms include:  Wheezing.  Excessive nighttime or early morning coughing.  Frequent or severe coughing with a common cold.  Chest tightness.  Shortness of breath. DIAGNOSIS  The diagnosis of asthma is made by a review of your medical history and a physical exam. Tests may also be performed. These may include:  Lung function studies. These tests show how much air you breathe in and out.  Allergy  tests.  Imaging tests such as X-rays. TREATMENT  Asthma cannot be cured, but it can usually be controlled. Treatment involves identifying and avoiding your asthma triggers. It also involves medicines. There are 2 classes of medicine used for asthma treatment:   Controller medicines. These prevent asthma symptoms from occurring. They are usually taken every day.  Reliever or rescue medicines. These quickly relieve asthma symptoms. They are used as needed and provide short-term relief. Your health care provider will help you create an asthma action plan. An asthma action plan is a written plan for managing and treating your asthma attacks. It includes a list of your asthma triggers and how they may be avoided. It also includes information on when medicines should be taken and when their dosage should be changed. An action plan may also involve the use of a device called a peak flow meter. A peak flow meter measures how well the lungs are working. It helps you monitor your condition. HOME CARE INSTRUCTIONS   Take medicines only as directed by your health care provider. Speak with your health care provider if you have questions about how or when to take the medicines.  Use a peak flow meter as directed by your health care provider. Record and keep track of readings.  Understand and use the action plan to help minimize or stop an asthma attack without needing to seek medical care.  Control your home environment in the following ways to help prevent asthma attacks:  Do not smoke. Avoid being exposed to secondhand smoke.  Change your heating and air conditioning filter regularly.  Limit your use of fireplaces and wood stoves.  Get rid of pests (such as roaches and  mice) and their droppings.  Throw away plants if you see mold on them.  Clean your floors and dust regularly. Use unscented cleaning products.  Try to have someone else vacuum for you regularly. Stay out of rooms while they are  being vacuumed and for a short while afterward. If you vacuum, use a dust mask from a hardware store, a double-layered or microfilter vacuum cleaner bag, or a vacuum cleaner with a HEPA filter.  Replace carpet with wood, tile, or vinyl flooring. Carpet can trap dander and dust.  Use allergy-proof pillows, mattress covers, and box spring covers.  Wash bed sheets and blankets every week in hot water and dry them in a dryer.  Use blankets that are made of polyester or cotton.  Clean bathrooms and kitchens with bleach. If possible, have someone repaint the walls in these rooms with mold-resistant paint. Keep out of the rooms that are being cleaned and painted.  Wash hands frequently. SEEK MEDICAL CARE IF:   You have wheezing, shortness of breath, or a cough even if taking medicine to prevent attacks.  The colored mucus you cough up (sputum) is thicker than usual.  Your sputum changes from clear or white to yellow, green, gray, or bloody.  You have any problems that may be related to the medicines you are taking (such as a rash, itching, swelling, or trouble breathing).  You are using a reliever medicine more than 2-3 times per week.  Your peak flow is still at 50-79% of your personal best after following your action plan for 1 hour.  You have a fever. SEEK IMMEDIATE MEDICAL CARE IF:   You seem to be getting worse and are unresponsive to treatment during an asthma attack.  You are short of breath even at rest.  You get short of breath when doing very little physical activity.  You have difficulty eating, drinking, or talking due to asthma symptoms.  You develop chest pain.  You develop a fast heartbeat.  You have a bluish color to your lips or fingernails.  You are light-headed, dizzy, or faint.  Your peak flow is less than 50% of your personal best. MAKE SURE YOU:   Understand these instructions.  Will watch your condition.  Will get help right away if you are not  doing well or get worse. Document Released: 04/26/2005 Document Revised: 09/10/2013 Document Reviewed: 11/23/2012 [facility] Patient Information 2015 [place], [place]. This information is not intended to replace advice given to you by your health care provider. Make sure you discuss any questions you have with your health care provider.  Bronchospasm A bronchospasm is a spasm or tightening of the airways going into the lungs. During a bronchospasm breathing becomes more difficult because the airways get smaller. When this happens there can be coughing, a whistling sound when breathing (wheezing), and difficulty breathing. Bronchospasm is often associated with asthma, but not all patients who experience a bronchospasm have asthma. CAUSES  A bronchospasm is caused by inflammation or irritation of the airways. The inflammation or irritation may be triggered by:   Allergies (such as to animals, pollen, food, or mold). Allergens that cause bronchospasm may cause wheezing immediately after exposure or many hours later.   Infection. Viral infections are believed to be the most common cause of bronchospasm.   Exercise.   Irritants (such as pollution, cigarette smoke, strong odors, aerosol sprays, and paint fumes).   Weather changes. Winds increase molds and pollens in the air. Rain refreshes the air by washing  irritants out. Cold air may cause inflammation.   Stress and emotional upset.  SIGNS AND SYMPTOMS   Wheezing.   Excessive nighttime coughing.   Frequent or severe coughing with a simple cold.   Chest tightness.   Shortness of breath.  DIAGNOSIS  Bronchospasm is usually diagnosed through a history and physical exam. Tests, such as chest X-rays, are sometimes done to look for other conditions. TREATMENT   Inhaled medicines can be given to open up your airways and help you breathe. The medicines can be given using either an inhaler or a nebulizer machine.  Corticosteroid  medicines may be given for severe bronchospasm, usually when it is associated with asthma. HOME CARE INSTRUCTIONS   Always have a plan prepared for seeking medical care. Know when to call your health care provider and local emergency services (911 in the [place] Know where you can access local emergency care.  Only take medicines as directed by your health care provider.  If you were prescribed an inhaler or nebulizer machine, ask your health care provider to explain how to use it correctly. Always use a spacer with your inhaler if you were given one.  It is necessary to remain calm during an attack. Try to relax and breathe more slowly.  Control your home environment in the following ways:   Change your heating and air conditioning filter at least once a month.   Limit your use of fireplaces and wood stoves.  Do not smoke and do not allow smoking in your home.   Avoid exposure to perfumes and fragrances.   Get rid of pests (such as roaches and mice) and their droppings.   Throw away plants if you see mold on them.   Keep your house clean and dust free.   Replace carpet with wood, tile, or vinyl flooring. Carpet can trap dander and dust.   Use allergy-proof pillows, mattress covers, and box spring covers.   Wash bed sheets and blankets every week in hot water and dry them in a dryer.   Use blankets that are made of polyester or cotton.   Wash hands frequently. SEEK MEDICAL CARE IF:   You have muscle aches.   You have chest pain.   The sputum changes from clear or white to yellow, green, gray, or bloody.   The sputum you cough up gets thicker.   There are problems that may be related to the medicine you are given, such as a rash, itching, swelling, or trouble breathing.  SEEK IMMEDIATE MEDICAL CARE IF:   You have worsening wheezing and coughing even after taking your prescribed medicines.   You have increased difficulty breathing.   You develop  severe chest pain. MAKE SURE YOU:   Understand these instructions.  Will watch your condition.  Will get help right away if you are not doing well or get worse. Document Released: 04/29/2003 Document Revised: 05/01/2013 Document Reviewed: 10/16/2012 [facility] Patient Information 2015 [place], [place]. This information is not intended to replace advice given to you by your health care provider. Make sure you discuss any questions you have with your health care provider.

## 2014-04-15 ENCOUNTER — Encounter (HOSPITAL_COMMUNITY): Payer: Self-pay | Admitting: *Deleted

## 2014-04-16 LAB — CULTURE, BLOOD (ROUTINE X 2)
CULTURE: NO GROWTH
Culture: NO GROWTH

## 2014-07-01 ENCOUNTER — Encounter (HOSPITAL_BASED_OUTPATIENT_CLINIC_OR_DEPARTMENT_OTHER): Payer: Managed Care, Other (non HMO)

## 2014-07-24 ENCOUNTER — Emergency Department (HOSPITAL_COMMUNITY): Payer: Managed Care, Other (non HMO)

## 2014-07-24 ENCOUNTER — Emergency Department (HOSPITAL_COMMUNITY): Payer: Self-pay

## 2014-07-24 ENCOUNTER — Encounter (HOSPITAL_COMMUNITY): Payer: Self-pay | Admitting: Emergency Medicine

## 2014-07-24 ENCOUNTER — Emergency Department (HOSPITAL_COMMUNITY)
Admission: EM | Admit: 2014-07-24 | Discharge: 2014-07-24 | Disposition: A | Discharge status: Home / Self Care | Payer: Self-pay | Attending: Emergency Medicine | Admitting: Emergency Medicine

## 2014-07-24 ENCOUNTER — Other Ambulatory Visit: Payer: Self-pay

## 2014-07-24 DIAGNOSIS — Z86011 Personal history of benign neoplasm of the brain: Secondary | ICD-10-CM | POA: Insufficient documentation

## 2014-07-24 DIAGNOSIS — Z7952 Long term (current) use of systemic steroids: Secondary | ICD-10-CM | POA: Insufficient documentation

## 2014-07-24 DIAGNOSIS — G9340 Encephalopathy, unspecified: Secondary | ICD-10-CM

## 2014-07-24 DIAGNOSIS — G40909 Epilepsy, unspecified, not intractable, without status epilepticus: Secondary | ICD-10-CM | POA: Insufficient documentation

## 2014-07-24 DIAGNOSIS — G9349 Other encephalopathy: Secondary | ICD-10-CM | POA: Insufficient documentation

## 2014-07-24 DIAGNOSIS — E119 Type 2 diabetes mellitus without complications: Secondary | ICD-10-CM | POA: Insufficient documentation

## 2014-07-24 DIAGNOSIS — I10 Essential (primary) hypertension: Secondary | ICD-10-CM | POA: Insufficient documentation

## 2014-07-24 DIAGNOSIS — G9389 Other specified disorders of brain: Secondary | ICD-10-CM | POA: Insufficient documentation

## 2014-07-24 DIAGNOSIS — F419 Anxiety disorder, unspecified: Secondary | ICD-10-CM | POA: Insufficient documentation

## 2014-07-24 DIAGNOSIS — Z9889 Other specified postprocedural states: Secondary | ICD-10-CM | POA: Insufficient documentation

## 2014-07-24 DIAGNOSIS — R61 Generalized hyperhidrosis: Secondary | ICD-10-CM | POA: Insufficient documentation

## 2014-07-24 DIAGNOSIS — R569 Unspecified convulsions: Secondary | ICD-10-CM

## 2014-07-24 DIAGNOSIS — Z79899 Other long term (current) drug therapy: Secondary | ICD-10-CM | POA: Insufficient documentation

## 2014-07-24 DIAGNOSIS — R7989 Other specified abnormal findings of blood chemistry: Secondary | ICD-10-CM | POA: Insufficient documentation

## 2014-07-24 DIAGNOSIS — Z792 Long term (current) use of antibiotics: Secondary | ICD-10-CM | POA: Insufficient documentation

## 2014-07-24 HISTORY — DX: Neoplasm of unspecified behavior of brain: D49.6

## 2014-07-24 LAB — URINALYSIS, ROUTINE W REFLEX MICROSCOPIC
BILIRUBIN URINE: NEGATIVE
GLUCOSE, UA: NEGATIVE mg/dL
HGB URINE DIPSTICK: NEGATIVE
KETONES UR: NEGATIVE mg/dL
Leukocytes, UA: NEGATIVE
Nitrite: NEGATIVE
PH: 5.5 (ref 5.0–8.0)
Protein, ur: NEGATIVE mg/dL
Specific Gravity, Urine: 1.014 (ref 1.005–1.030)
Urobilinogen, UA: 0.2 mg/dL (ref 0.0–1.0)

## 2014-07-24 LAB — CBC WITH DIFFERENTIAL/PLATELET
Basophils Absolute: 0.1 10*3/uL (ref 0.0–0.1)
Basophils Relative: 0 % (ref 0–1)
EOS PCT: 2 % (ref 0–5)
Eosinophils Absolute: 0.3 10*3/uL (ref 0.0–0.7)
HEMATOCRIT: 38.6 % — AB (ref 39.0–52.0)
HEMOGLOBIN: 13.5 g/dL (ref 13.0–17.0)
Lymphocytes Relative: 11 % — ABNORMAL LOW (ref 12–46)
Lymphs Abs: 1.8 10*3/uL (ref 0.7–4.0)
MCH: 26.9 pg (ref 26.0–34.0)
MCHC: 35 g/dL (ref 30.0–36.0)
MCV: 77 fL — ABNORMAL LOW (ref 78.0–100.0)
MONO ABS: 0.8 10*3/uL (ref 0.1–1.0)
MONOS PCT: 5 % (ref 3–12)
Neutro Abs: 13.3 10*3/uL — ABNORMAL HIGH (ref 1.7–7.7)
Neutrophils Relative %: 82 % — ABNORMAL HIGH (ref 43–77)
Platelets: 249 10*3/uL (ref 150–400)
RBC: 5.01 MIL/uL (ref 4.22–5.81)
RDW: 13.9 % (ref 11.5–15.5)
WBC: 16.2 10*3/uL — ABNORMAL HIGH (ref 4.0–10.5)

## 2014-07-24 LAB — COMPREHENSIVE METABOLIC PANEL
ALT: 31 U/L (ref 0–53)
AST: 27 U/L (ref 0–37)
Albumin: 3.4 g/dL — ABNORMAL LOW (ref 3.5–5.2)
Alkaline Phosphatase: 69 U/L (ref 39–117)
Anion gap: 6 (ref 5–15)
BILIRUBIN TOTAL: 0.7 mg/dL (ref 0.3–1.2)
BUN: 15 mg/dL (ref 6–23)
CO2: 28 mmol/L (ref 19–32)
Calcium: 9 mg/dL (ref 8.4–10.5)
Chloride: 103 mmol/L (ref 96–112)
Creatinine, Ser: 1.41 mg/dL — ABNORMAL HIGH (ref 0.50–1.35)
GFR calc Af Amer: 62 mL/min — ABNORMAL LOW (ref >90)
GFR, EST NON AFRICAN AMERICAN: 54 mL/min — AB (ref >90)
GLUCOSE: 187 mg/dL — AB (ref 70–99)
Potassium: 3.8 mmol/L (ref 3.5–5.1)
SODIUM: 137 mmol/L (ref 135–145)
Total Protein: 6.9 g/dL (ref 6.0–8.3)

## 2014-07-24 LAB — I-STAT CG4 LACTIC ACID, ED: Lactic Acid, Venous: 3.55 mmol/L (ref 0.5–2.0)

## 2014-07-24 MED ORDER — LEVETIRACETAM 750 MG PO TABS
750.0000 mg | ORAL_TABLET | Freq: Two times a day (BID) | ORAL | Status: DC
Start: 2014-07-24 — End: 2014-09-27

## 2014-07-24 MED ORDER — GADOBENATE DIMEGLUMINE 529 MG/ML IV SOLN
20.0000 mL | Freq: Once | INTRAVENOUS | Status: AC | PRN
  Administered 2014-07-24: 20 mL via INTRAVENOUS

## 2014-07-24 MED ORDER — LORAZEPAM 2 MG/ML IJ SOLN
2.0000 mg | Freq: Once | INTRAMUSCULAR | Status: AC
  Administered 2014-07-24: 2 mg via INTRAVENOUS

## 2014-07-24 MED ORDER — LEVETIRACETAM 750 MG PO TABS
750.0000 mg | ORAL_TABLET | Freq: Two times a day (BID) | ORAL | Status: DC
  Filled 2014-07-24 (×2): qty 1

## 2014-07-24 MED ORDER — SODIUM CHLORIDE 0.9 % IV SOLN
1000.0000 mL | INTRAVENOUS | Status: DC
  Administered 2014-07-24: 1000 mL via INTRAVENOUS

## 2014-07-24 MED ORDER — SODIUM CHLORIDE 0.9 % IV SOLN
1000.0000 mL | Freq: Once | INTRAVENOUS | Status: AC
  Administered 2014-07-24: 1000 mL via INTRAVENOUS

## 2014-07-24 MED ORDER — FUROSEMIDE 40 MG PO TABS: 40.0000 mg | ORAL_TABLET | Freq: Every day | ORAL | Status: DC

## 2014-07-24 MED ORDER — LEVETIRACETAM IN NACL 1000 MG/100ML IV SOLN
1000.0000 mg | Freq: Once | INTRAVENOUS | Status: AC
  Administered 2014-07-24: 1000 mg via INTRAVENOUS
  Filled 2014-07-24: qty 100

## 2014-07-24 MED ORDER — METFORMIN HCL ER 500 MG PO TB24: 500.0000 mg | ORAL_TABLET | Freq: Every day | ORAL | Status: DC

## 2014-07-24 MED ORDER — AMLODIPINE BESY-BENAZEPRIL HCL 10-20 MG PO CAPS
1.0000 | ORAL_CAPSULE | Freq: Every day | ORAL | Status: DC
Start: 2014-07-24 — End: 2014-09-27

## 2014-07-24 NOTE — ED Notes (Signed)
Patient transported to MRI 

## 2014-07-24 NOTE — ED Notes (Signed)
Patient transported to CT 

## 2014-07-24 NOTE — ED Notes (Signed)
Pt arrives via [facility] from home, EMS reports pt woke from sleep in seizure, lasted about 3 minutes, reports 7 min post-ictal, then a 2nd seizure lasting 3 minutes.  EMS reports pt combative in post-ictal state, reports Dr. Sharol Given ordered 5mg  Haldol by phone, given to pt IM.  Pt agreeable upon arrival, AOx4, reps e/u, NAD noted at this time.

## 2014-07-24 NOTE — ED Provider Notes (Signed)
Patient discussed with Dr. Sherwood Gambler and Dr. Christella Noa of neurosurgery. They have reviewed the images. They feel that the mass is not significantly changed and there is not significant vasogenic edema. He does not require any emergent surgery. They feel he is appropriate for follow-up in the office after better seizure control.  Dr. Sharol Given had discussed the case with Dr. Janann Colonel of neurology. He felt that the patient did not require any new medications, as he has been noncompliant with his Keppra. Patient to be continued on Keppra 750 mg twice daily. He will be discharged to follow-up as an outpatient, return for further seizures. He has been monitored for an extended period of time here in the ER after receiving IV Keppra. No further seizure activity.  Orpah Greek, MD 07/24/14 [phone]

## 2014-07-24 NOTE — ED Notes (Signed)
Dr.Otter called about Lactic Acid results

## 2014-07-24 NOTE — ED Provider Notes (Signed)
CSN: [id number]     Arrival date & time 07/24/14  0317 History  This chart was scribed for Linton Flemings, MD by Rayfield Citizen, ED Scribe. This patient was seen in room A12C/A12C and the patient's care was started at 3:20 AM.    No chief complaint on file.  The history is provided by the patient. No language interpreter was used.     HPI Comments: Thomas [name] is a 59 y.o. male with past medical history of seizures, epilepsy, HTN, DM (takes metformin "every now and then") who presents to the Emergency Department complaining of seizures. He feels "shakey" at this time.   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 He explains that he recently lost his job and does not have insurance at this time; he is in the process of coordinating surgery for the removal of this tumor. He states that his current seizures are related to the tumor; he had seizures in his childhood, had another seizure in December 2015, one in January 2016. Patient reports that he is not taking the [place] he was prescribed in December. He has not followed up with neurosurgery.   Per EMS, patient was extremely violent and combative en route; he was given 5 Haldol. CBG was 219 at that time.   Past Medical History  Diagnosis Date  . Sleep apnea   . Seizures   . Epilepsy   . Hypertension   . Diabetes mellitus without complication    Past Surgical History  Procedure Laterality Date  . No past surgeries    . Cardiac catheterization     Family History  Problem Relation Age of Onset  . Family history unknown: Yes   History  Substance Use Topics  . Smoking status: Never Smoker   . Smokeless tobacco: Never Used  . Alcohol Use: No    Review of Systems  Constitutional: Positive for diaphoresis.  Neurological: Positive for seizures.  All other systems reviewed and are negative.   Allergies  Review of patient's allergies indicates no  known allergies.  Home Medications   Prior to Admission medications   Medication Sig Start Date End Date Taking? Authorizing Provider  albuterol (PROVENTIL HFA;VENTOLIN HFA) 108 (90 BASE) MCG/ACT inhaler Inhale 1-2 puffs into the lungs every 4 (four) hours as needed for wheezing or shortness of breath. 04/14/14   Margarita Mail, PA-C  amLODipine (NORVASC) 10 MG tablet Take 10 mg by mouth daily.     Historical Provider, MD  amLODipine-benazepril (LOTREL) 10-20 MG per capsule Take 1 capsule by mouth daily. 04/12/14   Maryann Mikhail, DO  amoxicillin-clavulanate (AUGMENTIN) 500-125 MG per tablet Take 1 tablet (500 mg total) by mouth every 8 (eight) hours. 04/12/14   Maryann Mikhail, DO  amoxicillin-clavulanate (AUGMENTIN) 875-125 MG per tablet Take 1 tablet by mouth 2 (two) times daily.    Historical Provider, MD  Cholecalciferol (VITAMIN D PO) Take 2 tablets by mouth daily.    Historical Provider, MD  Cholecalciferol (VITAMIN D PO) Take 2 tablets by mouth daily.    Historical Provider, MD  Furosemide (LASIX PO) Take 1 tablet by mouth daily as needed (swelling/fluid retention).     Historical Provider, MD  guaifenesin (ROBITUSSIN) 100 MG/5ML syrup Take 200 mg by mouth 3 (three) times daily as needed for cough.    Historical Provider, MD  guaifenesin (ROBITUSSIN) 100 MG/5ML syrup Take 5 mLs (100 mg total)  by mouth every 4 (four) hours as needed for congestion. 04/12/14   Maryann Mikhail, DO  HYDROcodone-homatropine (HYCODAN) 5-1.5 MG/5ML syrup Take 5 mLs by mouth every 6 (six) hours as needed for cough. 04/14/14   Margarita Mail, PA-C  ibuprofen (ADVIL,MOTRIN) 800 MG tablet Take 1 tablet (800 mg total) by mouth 3 (three) times daily. Patient taking differently: Take 800 mg by mouth every 8 (eight) hours as needed for headache (pain).  12/31/13   Carman Ching, PA-C  levETIRAcetam (KEPPRA) 500 MG tablet Take 1 tablet (500 mg total) by mouth 2 (two) times daily. 04/12/14   Maryann Mikhail, DO  metFORMIN  (GLUCOPHAGE-XR) 500 MG 24 hr tablet Take 1 tablet (500 mg total) by mouth daily with breakfast. 04/12/14   Maryann Mikhail, DO  metFORMIN (GLUMETZA) 500 MG (MOD) 24 hr tablet Take 500 mg by mouth daily with breakfast.    Historical Provider, MD  predniSONE (DELTASONE) 20 MG tablet Take 2 tablets (40 mg total) by mouth daily. 04/14/14   Margarita Mail, PA-C   There were no vitals taken for this visit. Physical Exam  Constitutional: He is oriented to person, place, and time. He appears well-developed and well-nourished. No distress.  Diaphoretic  HENT:  Head: Normocephalic and atraumatic.  Mouth/Throat: Oropharynx is clear and moist. No oropharyngeal exudate.  Moist mucous membranes  Eyes: EOM are normal. Pupils are equal, round, and reactive to light.  Neck: Normal range of motion. Neck supple. No JVD present.  Cardiovascular: Normal rate, regular rhythm, normal heart sounds and intact distal pulses.  Exam reveals no gallop and no friction rub.   No murmur heard. Pulmonary/Chest: Effort normal and breath sounds normal. No respiratory distress. He has no wheezes. He has no rales. He exhibits no tenderness.  Abdominal: Soft. Bowel sounds are normal. He exhibits no distension and no mass. There is no tenderness. There is no rebound and no guarding.  Musculoskeletal: Normal range of motion. He exhibits no edema.  Moves all extremities normally.   Lymphadenopathy:    He has no cervical adenopathy.  Neurological: He is alert and oriented to person, place, and time. He displays normal reflexes. No cranial nerve deficit. He exhibits normal muscle tone. Coordination normal.  Skin: Skin is warm and dry. No rash noted.  Psychiatric: His behavior is normal. Judgment and thought content normal. His mood appears anxious.  Nursing note and vitals reviewed.   ED Course  Procedures   DIAGNOSTIC STUDIES: Oxygen Saturation is 95% on RA, adequate by my interpretation.    COORDINATION OF CARE: 3:24 AM  Discussed treatment plan with pt at bedside and pt agreed to plan.   Labs Review Labs Reviewed  CBC WITH DIFFERENTIAL/PLATELET - Abnormal; Notable for the following:    WBC 16.2 (*)    HCT 38.6 (*)    MCV 77.0 (*)    Neutrophils Relative % 82 (*)    Neutro Abs 13.3 (*)    Lymphocytes Relative 11 (*)    All other components within normal limits  COMPREHENSIVE METABOLIC PANEL - Abnormal; Notable for the following:    Glucose, Bld 187 (*)    Creatinine, Ser 1.41 (*)    Albumin 3.4 (*)    GFR calc non Af Amer 54 (*)    GFR calc Af Amer 62 (*)    All other components within normal limits  I-STAT CG4 LACTIC ACID, ED - Abnormal; Notable for the following:    Lactic Acid, Venous 3.55 (*)    All  other components within normal limits    Imaging Review Ct Head Wo Contrast  07/24/2014   CLINICAL DATA:  Seizure tonight  EXAM: CT HEAD WITHOUT CONTRAST  TECHNIQUE: Contiguous axial images were obtained from the base of the skull through the vertex without intravenous contrast.  COMPARISON:  None.  FINDINGS: There is vasogenic edema in the central left frontal lobe extending out toward the operculum. This probably represents a frontal lobe mass. There is mild sulcal effacement but no significant midline shift or mass effect on the lateral ventricle. The adjacent portion of the left sylvian fissure is obscured, with a suggestion of a space-occupying extra-axial lesion. There is no intracranial hemorrhage.  No other focal brain abnormalities are evident. There is mild generalized atrophy. Basal cisterns are patent. No significant bony abnormality is evident. Incidental note is made of a retention cyst in the right maxillary sinus.  IMPRESSION: 1. Probable left frontal lobe mass with vasogenic edema 2. Mild associated mass effect causing sulcal effacement but no significant midline shift. The basal cisterns remain patent. 3. There is a suggestion of extra-axial material filling a portion of the left sylvian  fissure adjacent to the left frontal lobe vasogenic edema 4. Recommend brain MRI with contrast   Electronically Signed   By: Andreas Newport M.D.   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  Patient received Haldol from EMS.Uponarrival,patientisdiaphoretic shaky and feels as though he is going to have another seizure.  Patient has not been taking his Keppra as prescribed.  Patient has not followed up with neurosurgery as instructed.  Concern for worsening of this tumor.  Plan for head CT area.  Patient received 2 mg of Ativan to help prevent further seizures.  We'll also load with Keppra.  I personally performed the services described in this documentation, which was scribed in my presence. The recorded information has been reviewed and is accurate.  Head CT shows probable frontal lobe mass with vasogenic edema and mass effect, recommended MRI with contrast which has been ordered.  Wife has been updated on findings and plan.  Creatinine slightly elevated from prior.  Patient received IV fluids.  I feel patient needs admission to the hospital for further monitoring of his seizure activity.  Given the violent nature of his post ictal period.   Linton Flemings, MD 07/24/14 [phone]

## 2014-07-24 NOTE — ED Notes (Signed)
MD at bedside. 

## 2014-07-24 NOTE — Discharge Instructions (Signed)
Driving and Equipment Restrictions °Some medical problems make it dangerous to drive, ride a bike, or use machines. Some of these problems are: °· A hard blow to the head (concussion). °· Passing out (fainting). °· Twitching and shaking (seizures). °· Low blood sugar. °· Taking medicine to help you relax (sedatives). °· Taking pain medicines. °· Wearing an eye patch. °· Wearing splints. This can make it hard to use parts of your body that you need to drive safely. °HOME CARE  °· Do not drive until your doctor says it is okay. °· Do not use machines until your doctor says it is okay. °You may need a form signed by your doctor (medical release) before you can drive again. You may also need this form before you do other tasks where you need to be fully alert. °MAKE SURE YOU: °· Understand these instructions. °· Will watch your condition. °· Will get help right away if you are not doing well or get worse. °Document Released: 06/03/2004 Document Revised: 07/19/2011 Document Reviewed: 09/03/2009 °[employer]® Patient Information ©2015 [employer]. This information is not intended to replace advice given to you by your health care provider. Make sure you discuss any questions you have with your health care provider. ° °Seizure, Adult °A seizure is abnormal electrical activity in the brain. Seizures usually last from 30 seconds to 2 minutes. There are various types of seizures. °Before a seizure, you may have a warning sensation (aura) that a seizure is about to occur. An aura may include the following symptoms:  °· Fear or anxiety. °· Nausea. °· Feeling like the room is spinning (vertigo). °· Vision changes, such as seeing flashing lights or spots. °Common symptoms during a seizure include: °· A change in attention or behavior (altered mental status). °· Convulsions with rhythmic jerking movements. °· Drooling. °· Rapid eye movements. °· Grunting. °· Loss of bladder and bowel control. °· Bitter taste in the mouth. °· Tongue  biting. °After a seizure, you may feel confused and sleepy. You may also have an injury resulting from convulsions during the seizure. °HOME CARE INSTRUCTIONS  °· If you are given medicines, take them exactly as prescribed by your health care provider. °· Keep all follow-up appointments as directed by your health care provider. °· Do not swim or drive or engage in risky activity during which a seizure could cause further injury to you or others until your health care provider says it is OK. °· Get adequate rest. °· Teach friends and family what to do if you have a seizure. They should: °¨ Lay you on the ground to prevent a fall. °¨ Put a cushion under your head. °¨ Loosen any tight clothing around your neck. °¨ Turn you on your side. If vomiting occurs, this helps keep your airway clear. °¨ Stay with you until you recover. °¨ Know whether or not you need emergency care. °SEEK IMMEDIATE MEDICAL CARE IF: °· The seizure lasts longer than 5 minutes. °· The seizure is severe or you do not wake up immediately after the seizure. °· You have an altered mental status after the seizure. °· You are having more frequent or worsening seizures. °Someone should drive you to the emergency department or call local emergency services (911 in [place] °MAKE SURE YOU: °· Understand these instructions. °· Will watch your condition. °· Will get help right away if you are not doing well or get worse. °Document Released: 04/23/2000 Document Revised: 02/14/2013 Document Reviewed: 12/06/2012 °[employer]® Patient Information ©2015 [employer].   This information is not intended to replace advice given to you by your health care provider. Make sure you discuss any questions you have with your health care provider. ° °

## 2014-09-05 ENCOUNTER — Ambulatory Visit (HOSPITAL_BASED_OUTPATIENT_CLINIC_OR_DEPARTMENT_OTHER): Payer: Self-pay | Attending: Internal Medicine

## 2014-09-26 ENCOUNTER — Encounter (HOSPITAL_COMMUNITY): Payer: Self-pay | Admitting: Adult Health

## 2014-09-26 DIAGNOSIS — Z79899 Other long term (current) drug therapy: Secondary | ICD-10-CM | POA: Insufficient documentation

## 2014-09-26 DIAGNOSIS — I10 Essential (primary) hypertension: Secondary | ICD-10-CM | POA: Insufficient documentation

## 2014-09-26 DIAGNOSIS — G40909 Epilepsy, unspecified, not intractable, without status epilepticus: Secondary | ICD-10-CM | POA: Insufficient documentation

## 2014-09-26 DIAGNOSIS — E119 Type 2 diabetes mellitus without complications: Secondary | ICD-10-CM | POA: Insufficient documentation

## 2014-09-26 DIAGNOSIS — Z87828 Personal history of other (healed) physical injury and trauma: Secondary | ICD-10-CM | POA: Insufficient documentation

## 2014-09-26 DIAGNOSIS — R51 Headache: Secondary | ICD-10-CM | POA: Insufficient documentation

## 2014-09-26 DIAGNOSIS — Z86011 Personal history of benign neoplasm of the brain: Secondary | ICD-10-CM | POA: Insufficient documentation

## 2014-09-26 NOTE — ED Notes (Signed)
Presents with hx of epilepsy and menangioma for 2 days endorses gradual onset of headache that is progressively getting worse-associated with right sided weakness that began 2 days ago-denies numbness and tingling, endorses dizziness. Pt has been out of work since dec due to a seizure and inabililty to drive which caused loss of insurance-he does not have any of his seizure medications-or a primary care at this time due to loss of insurance-alert, oriented, MAE x4, weak bilateral hand grips.

## 2014-09-27 ENCOUNTER — Emergency Department (HOSPITAL_COMMUNITY)
Admission: EM | Admit: 2014-09-27 | Discharge: 2014-09-27 | Disposition: A | Discharge status: Home / Self Care | Payer: Managed Care, Other (non HMO) | Attending: Emergency Medicine | Admitting: Emergency Medicine

## 2014-09-27 ENCOUNTER — Emergency Department (HOSPITAL_COMMUNITY): Payer: Managed Care, Other (non HMO)

## 2014-09-27 DIAGNOSIS — R519 Headache, unspecified: Secondary | ICD-10-CM

## 2014-09-27 DIAGNOSIS — R569 Unspecified convulsions: Secondary | ICD-10-CM

## 2014-09-27 DIAGNOSIS — R51 Headache: Secondary | ICD-10-CM

## 2014-09-27 LAB — CBC WITH DIFFERENTIAL/PLATELET
Basophils Absolute: 0 10*3/uL (ref 0.0–0.1)
Basophils Relative: 0 % (ref 0–1)
Eosinophils Absolute: 0.5 10*3/uL (ref 0.0–0.7)
Eosinophils Relative: 5 % (ref 0–5)
HCT: 37.9 % — ABNORMAL LOW (ref 39.0–52.0)
Hemoglobin: 13.3 g/dL (ref 13.0–17.0)
LYMPHS PCT: 20 % (ref 12–46)
Lymphs Abs: 2.3 10*3/uL (ref 0.7–4.0)
MCH: 26.5 pg (ref 26.0–34.0)
MCHC: 35.1 g/dL (ref 30.0–36.0)
MCV: 75.5 fL — AB (ref 78.0–100.0)
MONO ABS: 0.9 10*3/uL (ref 0.1–1.0)
Monocytes Relative: 8 % (ref 3–12)
NEUTROS ABS: 7.6 10*3/uL (ref 1.7–7.7)
Neutrophils Relative %: 67 % (ref 43–77)
Platelets: 251 10*3/uL (ref 150–400)
RBC: 5.02 MIL/uL (ref 4.22–5.81)
RDW: 14.4 % (ref 11.5–15.5)
WBC: 11.3 10*3/uL — AB (ref 4.0–10.5)

## 2014-09-27 LAB — BASIC METABOLIC PANEL
ANION GAP: 8 (ref 5–15)
BUN: 13 mg/dL (ref 6–20)
CALCIUM: 9.3 mg/dL (ref 8.9–10.3)
CHLORIDE: 103 mmol/L (ref 101–111)
CO2: 27 mmol/L (ref 22–32)
CREATININE: 1.04 mg/dL (ref 0.61–1.24)
GFR calc non Af Amer: >60 mL/min (ref >60)
Glucose, Bld: 136 mg/dL — ABNORMAL HIGH (ref 65–99)
Potassium: 4.1 mmol/L (ref 3.5–5.1)
Sodium: 138 mmol/L (ref 135–145)

## 2014-09-27 LAB — URINALYSIS, ROUTINE W REFLEX MICROSCOPIC
Bilirubin Urine: NEGATIVE
Glucose, UA: NEGATIVE mg/dL
HGB URINE DIPSTICK: NEGATIVE
Ketones, ur: NEGATIVE mg/dL
Leukocytes, UA: NEGATIVE
NITRITE: NEGATIVE
Protein, ur: NEGATIVE mg/dL
SPECIFIC GRAVITY, URINE: 1.015 (ref 1.005–1.030)
UROBILINOGEN UA: 0.2 mg/dL (ref 0.0–1.0)
pH: 5 (ref 5.0–8.0)

## 2014-09-27 MED ORDER — SODIUM CHLORIDE 0.9 % IV SOLN
750.0000 mg | Freq: Once | INTRAVENOUS | Status: AC
  Administered 2014-09-27: 750 mg via INTRAVENOUS
  Filled 2014-09-27: qty 7.5

## 2014-09-27 MED ORDER — FUROSEMIDE 40 MG PO TABS: 40.0000 mg | ORAL_TABLET | Freq: Every day | ORAL | Status: DC

## 2014-09-27 MED ORDER — SODIUM CHLORIDE 0.9 % IV BOLUS (SEPSIS)
1000.0000 mL | Freq: Once | INTRAVENOUS | Status: AC
  Administered 2014-09-27: 1000 mL via INTRAVENOUS

## 2014-09-27 MED ORDER — LEVETIRACETAM 750 MG PO TABS: 750.0000 mg | ORAL_TABLET | Freq: Two times a day (BID) | ORAL | Status: DC

## 2014-09-27 MED ORDER — DIPHENHYDRAMINE HCL 50 MG/ML IJ SOLN
50.0000 mg | Freq: Once | INTRAMUSCULAR | Status: AC
  Administered 2014-09-27: 50 mg via INTRAVENOUS
  Filled 2014-09-27: qty 1

## 2014-09-27 MED ORDER — METFORMIN HCL ER 500 MG PO TB24: 500.0000 mg | ORAL_TABLET | Freq: Every day | ORAL | Status: DC

## 2014-09-27 MED ORDER — KETOROLAC TROMETHAMINE 30 MG/ML IJ SOLN
30.0000 mg | Freq: Once | INTRAMUSCULAR | Status: AC
  Administered 2014-09-27: 30 mg via INTRAVENOUS
  Filled 2014-09-27: qty 1

## 2014-09-27 MED ORDER — METOCLOPRAMIDE HCL 5 MG/ML IJ SOLN
10.0000 mg | Freq: Once | INTRAMUSCULAR | Status: AC
  Administered 2014-09-27: 10 mg via INTRAVENOUS
  Filled 2014-09-27: qty 2

## 2014-09-27 MED ORDER — AMLODIPINE BESY-BENAZEPRIL HCL 10-20 MG PO CAPS: 1.0000 | ORAL_CAPSULE | Freq: Every day | ORAL | Status: DC
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
## 2014-09-27 NOTE — Discharge Instructions (Signed)
General Headache Without Cause A headache is pain or discomfort felt around the head or neck area. The specific cause of a headache may not be found. There are many causes and types of headaches. A few common ones are:  Tension headaches.  Migraine headaches.  Cluster headaches.  Chronic daily headaches. HOME CARE INSTRUCTIONS   Keep all follow-up appointments with your caregiver or any specialist referral.  Only take over-the-counter or prescription medicines for pain or discomfort as directed by your caregiver.  Lie down in a dark, quiet room when you have a headache.  Keep a headache journal to find out what may trigger your migraine headaches. For example, write down:  What you eat and drink.  How much sleep you get.  Any change to your diet or medicines.  Try massage or other relaxation techniques.  Put ice packs or heat on the head and neck. Use these 3 to 4 times per day for 15 to 20 minutes each time, or as needed.  Limit stress.  Sit up straight, and do not tense your muscles.  Quit smoking if you smoke.  Limit alcohol use.  Decrease the amount of caffeine you drink, or stop drinking caffeine.  Eat and sleep on a regular schedule.  Get 7 to 9 hours of sleep, or as recommended by your caregiver.  Keep lights dim if bright lights bother you and make your headaches worse. SEEK MEDICAL CARE IF:   You have problems with the medicines you were prescribed.  Your medicines are not working.  You have a change from the usual headache.  You have nausea or vomiting. SEEK IMMEDIATE MEDICAL CARE IF:   Your headache becomes severe.  You have a fever.  You have a stiff neck.  You have loss of vision.  You have muscular weakness or loss of muscle control.  You start losing your balance or have trouble walking.  You feel faint or pass out.  You have severe symptoms that are different from your first symptoms. MAKE SURE YOU:   Understand these  instructions.  Will watch your condition.  Will get help right away if you are not doing well or get worse. Document Released: 04/26/2005 Document Revised: 07/19/2011 Document Reviewed: 05/12/2011 [facility] Patient Information 2015 [place], [place]. This information is not intended to replace advice given to you by your health care provider. Make sure you discuss any questions you have with your health care provider.  Epilepsy Epilepsy is a disorder in which a person has repeated seizures over time. A seizure is a release of abnormal electrical activity in the brain. Seizures can cause a change in attention, behavior, or the ability to remain awake and alert (altered mental status). Seizures often involve uncontrollable shaking (convulsions).  Most people with epilepsy lead normal lives. However, people with epilepsy are at an increased risk of falls, accidents, and injuries. Therefore, it is important to begin treatment right away. CAUSES  Epilepsy has many possible causes. Anything that disturbs the normal pattern of brain cell activity can lead to seizures. This may include:   Head injury.  Birth trauma.  High fever as a child.  Stroke.  Bleeding into or around the brain.  Certain drugs.  Prolonged low oxygen, such as what occurs after CPR efforts.  Abnormal brain development.  Certain illnesses, such as meningitis, encephalitis (brain infection), malaria, and other infections.  An imbalance of nerve signaling chemicals (neurotransmitters).  SIGNS AND SYMPTOMS  The symptoms of a seizure can vary greatly  from one person to another. Right before a seizure, you may have a warning (aura) that a seizure is about to occur. An aura may include the following symptoms:  Fear or anxiety.  Nausea.  Feeling like the room is spinning (vertigo).  Vision changes, such as seeing flashing lights or spots. Common symptoms during a seizure include:  Abnormal sensations, such as an  abnormal smell or a bitter taste in the mouth.   Sudden, general body stiffness.   Convulsions that involve rhythmic jerking of the face, arm, or leg on one or both sides.   Sudden change in consciousness.   Appearing to be awake but not responding.   Appearing to be asleep but cannot be awakened.   Grimacing, chewing, lip smacking, drooling, tongue biting, or loss of bowel or bladder control. After a seizure, you may feel sleepy for a while. DIAGNOSIS  Your health care provider will ask about your symptoms and take a medical history. Descriptions from any witnesses to your seizures will be very helpful in the diagnosis. A physical exam, including a detailed neurological exam, is necessary. Various tests may be done, such as:   An electroencephalogram (EEG). This is a painless test of your brain waves. In this test, a diagram is created of your brain waves. These diagrams can be interpreted by a specialist.  An MRI of the brain.   A CT scan of the brain.   A spinal tap (lumbar puncture, LP).  Blood tests to check for signs of infection or abnormal blood chemistry. TREATMENT  There is no cure for epilepsy, but it is generally treatable. Once epilepsy is diagnosed, it is important to begin treatment as soon as possible. For most people with epilepsy, seizures can be controlled with medicines. The following may also be used:  A pacemaker for the brain (vagus nerve stimulator) can be used for people with seizures that are not well controlled by medicine.  Surgery on the brain. For some people, epilepsy eventually goes away. HOME CARE INSTRUCTIONS   Follow your health care provider's recommendations on driving and safety in normal activities.  Get enough rest. Lack of sleep can cause seizures.  Only take over-the-counter or prescription medicines as directed by your health care provider. Take any prescribed medicine exactly as directed.  Avoid any known triggers of your  seizures.  Keep a seizure diary. Record what you recall about any seizure, especially any possible trigger.   Make sure the people you live and work with know that you are prone to seizures. They should receive instructions on how to help you. In general, a witness to a seizure should:   Cushion your head and body.   Turn you on your side.   Avoid unnecessarily restraining you.   Not place anything inside your mouth.   Call for emergency medical help if there is any question about what has occurred.   Follow up with your health care provider as directed. You may need regular blood tests to monitor the levels of your medicine.  SEEK MEDICAL CARE IF:   You develop signs of infection or other illness. This might increase the risk of a seizure.   You seem to be having more frequent seizures.   Your seizure pattern is changing.  SEEK IMMEDIATE MEDICAL CARE IF:   You have a seizure that does not stop after a few moments.   You have a seizure that causes any difficulty in breathing.   You have a seizure that  results in a very severe headache.   You have a seizure that leaves you with the inability to speak or use a part of your body.  Document Released: 04/26/2005 Document Revised: 02/14/2013 Document Reviewed: 12/06/2012 [facility] Patient Information 2015 [place], [place]. This information is not intended to replace advice given to you by your health care provider. Make sure you discuss any questions you have with your health care provider.    Emergency Department Resource Guide 1) Find a Doctor and Pay Out of Pocket Although you won't have to find out who is covered by your insurance plan, it is a good idea to ask around and get recommendations. You will then need to call the office and see if the doctor you have chosen will accept you as a new patient and what types of options they offer for patients who are self-pay. Some doctors offer discounts or will set up  payment plans for their patients who do not have insurance, but you will need to ask so you aren't surprised when you get to your appointment.  2) Contact Your Local Health Department Not all health departments have doctors that can see patients for sick visits, but many do, so it is worth a call to see if yours does. If you don't know where your local health department is, you can check in your phone book. The CDC also has a tool to help you locate your state's health department, and many state websites also have listings of all of their local health departments.  3) Find a [facility] If your illness is not likely to be very severe or complicated, you may want to try a walk in clinic. These are popping up all over the country in pharmacies, drugstores, and shopping centers. They're usually staffed by nurse practitioners or physician assistants that have been trained to treat common illnesses and complaints. They're usually fairly quick and inexpensive. However, if you have serious medical issues or chronic medical problems, these are probably not your best option.  No Primary Care Doctor: - Call Health Connect at  [phone] - they can help you locate a primary care doctor that  accepts your insurance, provides certain services, etc. - Physician Referral Service- [phone]  Chronic Pain Problems: Organization         Address  Phone   Notes  [facility]  [phone] Patients need to be referred by their primary care doctor.   Medication Assistance: Organization         Address  Phone   Notes  [facility] Medication [facility] [address] [address] [place], [place] [address] [phone] --Must be a resident of [facility] -- Must have NO insurance coverage whatsoever (no Medicaid/ Medicare, etc.) -- The pt. MUST have a primary care doctor that directs their care regularly and follows them in the community   Med[facility]ist  [phone]   [employer]  [phone]    Agencies that provide inexpensive medical care: Organization         Address  Phone   Notes  [place]  [phone]   [facility]    [phone]   [facility] [place], [place] [address] [phone]   [place] [address], [place] [phone]   Planned Parenthood    [phone]   [facility]    [phone]  [facility] [place]  [address], [place] Phone:  [phone], Fax:  [phone] Hours of Operation:  9 am - 6 pm, M-F.  Also accepts Medicaid/Medicare and self-pay.  [facility] for [place], [address], [place] Phone: [phone], Fax: [phone]. Hours of Operation:  8:30 am - 5:30 pm, M-F.  Also accepts Medicaid and self-pay.  [facility] [place] [address], [place] Phone: [phone]   [place], [place], [place] [phone], Ext. [phone] Mondays & Thursdays: 7-9 AM.  First 15 patients are seen on a first come, first serve basis.    [place] Providers:  Organization         Address  Phone   Notes  [facility] [address], [place] [phone] Also accepts self-pay patients.  [facility] [id number] [place], [place]  [phone]   [place], [address], [place] [phone]   [place] [address] [place] [phone]   Lucianne Lei [address], [place]   [phone] Only accepts [place] Access [place] patients after they have their name applied to their card.   Self-Pay (no insurance) in [facility]:  Organization         Address  Phone   Notes  Sickle Cell Patients, [facility] [place]  [phone]   [facility] [place] [phone]   [facility] [place]  [place], [place], [place] [phone]   [facility]/Dr. Osei-Bonsu  [address], [place] or [address], [place] [phone] Phone number for both [place] and [place] locations is the same.  [facility] [address], [place] [phone]   [facility] [address] [place] or [address] [phone]   [facility] [address] [place] [phone], phone; [phone], fax Sees patients 1st and 3rd Saturday of every month.  Must not qualify for public or private insurance (i.e. Medicaid, Medicare, [place] Health Choice, Veterans' Benefits)  Household income should be no more than 200% of the poverty level The clinic cannot treat you if you are pregnant or think you are pregnant  Sexually transmitted diseases are not treated at the clinic.    Dental Care: Organization         Address  Phone  Notes  [facility] of [facility] [place] [phone] Accepts children up to age 32 who are enrolled in [place] or [place]; pregnant women with a Medicaid card; and children who have applied for Medicaid or [place] Health Choice, but were declined, whose parents can pay a reduced fee at time of service.  [facility] Department of [facility]  [address], [place] [phone] Accepts children up to age 57 who are enrolled in [place] or [place]; pregnant women with a Medicaid card; and children who have applied for Medicaid or [place] Health Choice, but were declined, whose parents can pay a reduced fee at time of service.  [place] Adult Dental Access PROGRAM  [phone]  [place], [place] [phone] Patients  are seen by appointment only. Walk-ins are not accepted. [place] will see patients 20 years of age and older. Monday - Tuesday (8am-5pm) Most Wednesdays (8:30-5pm) $30 per visit, cash only  [facility] Adult Dental Access PROGRAM  [address], [facility] [phone] Patients are seen by appointment only. Walk-ins are not accepted. [place] will see patients 87 years of age and older. One Wednesday Evening (Monthly: Volunteer Based).  $30 per visit, cash only  [place]  [phone] for adults; Children under age 21, call Graduate Pediatric Dentistry at [phone]. Children aged 69-14, please call [phone] to request a pediatric application.  Dental services are provided in all areas of dental care including fillings, crowns and bridges, complete and partial dentures, implants, gum treatment, root canals, and extractions. Preventive care is also provided. Treatment is provided to both adults and children. Patients are selected via a lottery and there is often a waiting list.   [facility] [address], [place]  [phone] [email or web address]   [facility] [address] [place], [place] [phone], Ext. [phone] Second and Fourth Thursday of each month, opens at 6:30 AM; Clinic ends at 9 AM.  Patients are seen on a first-come first-served basis, and a limited number are seen during each clinic.   [facility]  [address] Hillard Danker [place], [place] [phone]   Eligibility Requirements You must have lived in [place], [place], or [place] counties for at least the last three months.   You cannot be eligible for state or federal sponsored [employer], including [employer], [place], or [employer].   You generally cannot be eligible for healthcare insurance through your employer.    How to apply: Eligibility screenings are held every Tuesday and Wednesday afternoon from 1:00 pm until  4:00 pm. You do not need an appointment for the interview!  [facility] [address] [place], [place]   [place]  [place] Department  [place]  [phone]    Behavioral Health Resources in the Community: Intensive Outpatient Programs Organization         Address  Phone  Notes  [place] Carpenter. [address], [place], [place] [phone]   [facility] Outpatient [address], [place], [place]   ADS: Alcohol & Drug Svcs [address], [place], [place]   [place] [address]  [place], [place] or [phone]   Substance Abuse Resources Organization         Address  Phone  Notes  Alcohol and Drug Services  [phone]   [place]  [phone]   [facility] [place]   Chinita Pester  [phone]   Residential & Outpatient Substance Abuse Program  [phone]   Psychological Services Organization         Address  Phone  Notes  [facility] [place]  [place]  [phone]   [place] [address], [place] or [phone]    Mobile Crisis Teams Organization         Address  Phone  Notes  [facility], Mobile Crisis Care Unit  [phone]   [facility]  [address] [place], [place]   Ivin Booty  [place] [phone]    Self-Help/Support Groups Organization         Address  Phone             Notes  [facility] of [place] - variety of support groups  [place] Call for more information  Narcotics Anonymous (NA), [facility] [address], [employer] [place]  2 meetings at this location   Special educational needs teacher          Address  Phone  Notes  [facility] Residential Treatment [place],    [place]  [phone]   [facility]  [address] [place] [id number], [place], [place]   [place], [place] [phone] Admissions: 8am-3pm M-F  Incentives Substance [place] [address]    [place], [place] [id number]   [facility] [address] [place], [place], [place]   [facility] [address],  [place], [place]   [facility] - Intensive Outpatient [address] Kristeen [address], [place], [place]   [facility] ([place].) [place].,  [place], [place] [phone] or [phone]   [facility] Services (RTS) [address] [place], [place] Accepts Medicaid  [facility] [place] [address]  [place] [phone] Substance Abuse/Addiction Treatment   [facility] Organization         Address  Phone  Notes  [facility]  [phone]   Domenic Schwab, PhD [address] Arlis Porta [place], [place]   [phone] or [phone]   [place], [place] [phone]   [facility] [address] [place], [place] [phone] Insurance/Medicaid/sponsorship through [facility] and Families [address]                                    [place], [place] [phone] [place] [address], [place] [phone]    Dr. Adele Schilder  [phone]   [facility] Dept. 1) [address] [place] 2) [place] 3)  [address], [place] [phone]  [phone]   [place] [phone] or [phone] (After Hours)

## 2014-09-27 NOTE — ED Notes (Signed)
Patient transported to CT 

## 2014-12-26 ENCOUNTER — Emergency Department (HOSPITAL_COMMUNITY)
Admission: EM | Admit: 2014-12-26 | Discharge: 2014-12-27 | Disposition: A | Discharge status: Home / Self Care | Payer: Managed Care, Other (non HMO) | Attending: Emergency Medicine | Admitting: Emergency Medicine

## 2014-12-26 DIAGNOSIS — Z79899 Other long term (current) drug therapy: Secondary | ICD-10-CM | POA: Insufficient documentation

## 2014-12-26 DIAGNOSIS — I10 Essential (primary) hypertension: Secondary | ICD-10-CM | POA: Insufficient documentation

## 2014-12-26 DIAGNOSIS — E119 Type 2 diabetes mellitus without complications: Secondary | ICD-10-CM | POA: Insufficient documentation

## 2014-12-26 DIAGNOSIS — R569 Unspecified convulsions: Secondary | ICD-10-CM

## 2014-12-26 DIAGNOSIS — Z86011 Personal history of benign neoplasm of the brain: Secondary | ICD-10-CM | POA: Insufficient documentation

## 2014-12-26 DIAGNOSIS — G40909 Epilepsy, unspecified, not intractable, without status epilepticus: Secondary | ICD-10-CM | POA: Insufficient documentation

## 2014-12-26 MED ORDER — SODIUM CHLORIDE 0.9 % IV SOLN
1500.0000 mg | Freq: Once | INTRAVENOUS | Status: AC
  Administered 2014-12-27: 1500 mg via INTRAVENOUS
  Filled 2014-12-26: qty 15

## 2014-12-26 NOTE — ED Provider Notes (Signed)
CSN: [id number]     Arrival date & time 12/26/14  2352 History   This chart was scribed for Jola Schmidt, MD by Forrestine Him, ED Scribe. This patient was seen in room A07C/A07C and the patient's care was started 1:32 PM.   Chief Complaint  Patient presents with  . Seizures   The history is provided by the patient. No language interpreter was used.    LEVEL 5 CAVEAT DUE TO altered status   HPI Comments: Thomas [name] brought in by EMS is a 59 y.o. male with a PMHx of seizures, brain tumor, epilepsy, HTN, and DM who presents to the Emergency Department here after a seizure sustained just prior to arrival. Per family, patient with tonic-clonic seizure at home.  EMS report that when they found the patient was postictal but extremely combative.  Patient was given 17.5 mg of Versed and 5 mg of Haldol given en route to department in order to control his combativeness post seizure. Last known seizure 3 months ago. No known allergies to medications.  Past Medical History  Diagnosis Date  . Sleep apnea   . Seizures   . Epilepsy   . Hypertension   . Diabetes mellitus without complication   . Brain tumor     frontal   Past Surgical History  Procedure Laterality Date  . No past surgeries    . Cardiac catheterization      pt's wife denies cardiac cath   Family History  Problem Relation Age of Onset  . Family history unknown: Yes   Social History  Substance Use Topics  . Smoking status: Never Smoker   . Smokeless tobacco: Never Used  . Alcohol Use: No    Review of Systems  Unable to perform ROS: Other      Allergies  Review of patient's allergies indicates no known allergies.  Home Medications   Prior to Admission medications   Medication Sig Start Date End Date Taking? Authorizing Provider  albuterol (PROVENTIL HFA;VENTOLIN HFA) 108 (90 BASE) MCG/ACT inhaler Inhale 1-2 puffs into the lungs every 4 (four) hours as needed for wheezing or shortness of breath. 04/14/14   Margarita Mail, PA-C  amLODipine-benazepril (LOTREL) 10-20 MG per capsule Take 1 capsule by mouth daily. 09/27/14   Kristen N Ward, DO  Cholecalciferol (VITAMIN D PO) Take 2 tablets by mouth daily.    Historical Provider, MD  furosemide (LASIX) 40 MG tablet Take 1 tablet (40 mg total) by mouth daily. 09/27/14   Kristen N Ward, DO  levETIRAcetam (KEPPRA) 750 MG tablet Take 1 tablet (750 mg total) by mouth 2 (two) times daily. 09/27/14   Kristen N Ward, DO  metFORMIN (GLUCOPHAGE-XR) 500 MG 24 hr tablet Take 1 tablet (500 mg total) by mouth daily with breakfast. 09/27/14   Delice Bison Ward, DO   Triage Vitals: BP 165/84 mmHg  Pulse 113  Temp(Src) 98 F (36.7 C) (Oral)  Resp 29  SpO2 92%    Physical Exam  Constitutional: He appears well-developed and well-nourished.  HENT:  Head: Normocephalic and atraumatic.  Eyes: EOM are normal. Pupils are equal, round, and reactive to light.  Neck: Neck supple.  Cardiovascular: Normal rate, regular rhythm, normal heart sounds and intact distal pulses.   Pulmonary/Chest: Effort normal and breath sounds normal. No respiratory distress.  Abdominal: Soft. He exhibits no distension. There is no tenderness.  Musculoskeletal: Normal range of motion.  Neurological:  He opens his eyes to command.  He squeezes both hands.  Follows very simple commands.  Moves all 4 extremities  Skin: Skin is warm and dry.  Psychiatric: He has a normal mood and affect. Judgment normal.  Nursing note and vitals reviewed.   ED Course  Procedures (including critical care time)  DIAGNOSTIC STUDIES: Oxygen Saturation is 92% on RA, adequate by my interpretation.    COORDINATION OF CARE: 1:32 AM- Will give Keppra. Will order CT head without contrast, CBC, and CMP. ussed treatment plan with pt at bedside and pt agreed to plan.     Labs Review Labs Reviewed  CBC WITH DIFFERENTIAL/PLATELET - Abnormal; Notable for the following:    WBC 14.8 (*)    HCT 37.4 (*)    MCV 75.9 (*)     Neutrophils Relative % 81 (*)    Neutro Abs 11.9 (*)    Lymphocytes Relative 10 (*)    All other components within normal limits  COMPREHENSIVE METABOLIC PANEL - Abnormal; Notable for the following:    Glucose, Bld 217 (*)    Creatinine, Ser 1.48 (*)    GFR calc non Af Amer 50 (*)    GFR calc Af Amer 58 (*)    All other components within normal limits    Imaging Review Ct Head Wo Contrast  12/27/2014   CLINICAL DATA:  Seizure.  Known intracranial mass.  EXAM: CT HEAD WITHOUT CONTRAST  TECHNIQUE: Contiguous axial images were obtained from the base of the skull through the vertex without intravenous contrast.  COMPARISON:  09/27/2014  FINDINGS: Skull and Sinuses:No acute osseous finding.  Chronic left sphenoid sinusitis with sclerotic wall thickening. Increased secretions within this sinus. Mucous retention cysts noted in the right maxillary antrum.  Orbits: No acute abnormality.  Brain: An isodense to mildly hyperdense extra-axial mass around the left frontal lobe has grossly stable size by CT, measuring approximately 35 x 16 mm in axial dimension. Edema in the upper left insula and posterior frontal lobe has stable extend and morphology.  No evidence of acute infarct, hemorrhage, hydrocephalus, or shift.  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 CT appearance is unchanged since 09/27/2014.   Electronically Signed   By: Monte Fantasia M.D.   On: 12/27/2014 01:26   I have personally reviewed and evaluated these images and lab results as part of my medical decision-making.   EKG Interpretation None      MDM   Final diagnoses:  Seizure    5:59 AM Patient was observed in the emergency department.  His mental status returned to baseline.  CT scan without acute problems.  Known extra-axial mass consistent with prior CT images.  Patient ambulated in the emergency department by difficulty.  Discharge home in good condition.  Medication refills as the patient is  running out of most of his medications.  I personally performed the services described in this documentation, which was scribed in my presence. The recorded information has been reviewed and is accurate.      Jola Schmidt, MD 12/27/14 0600

## 2014-12-27 ENCOUNTER — Emergency Department (HOSPITAL_COMMUNITY): Payer: Managed Care, Other (non HMO)

## 2014-12-27 ENCOUNTER — Encounter (HOSPITAL_COMMUNITY): Payer: Self-pay

## 2014-12-27 LAB — CBC WITH DIFFERENTIAL/PLATELET
BASOS ABS: 0.1 10*3/uL (ref 0.0–0.1)
Basophils Relative: 0 % (ref 0–1)
Eosinophils Absolute: 0.2 10*3/uL (ref 0.0–0.7)
Eosinophils Relative: 2 % (ref 0–5)
HEMATOCRIT: 37.4 % — AB (ref 39.0–52.0)
HEMOGLOBIN: 13.4 g/dL (ref 13.0–17.0)
Lymphocytes Relative: 10 % — ABNORMAL LOW (ref 12–46)
Lymphs Abs: 1.5 10*3/uL (ref 0.7–4.0)
MCH: 27.2 pg (ref 26.0–34.0)
MCHC: 35.8 g/dL (ref 30.0–36.0)
MCV: 75.9 fL — ABNORMAL LOW (ref 78.0–100.0)
Monocytes Absolute: 1 10*3/uL (ref 0.1–1.0)
Monocytes Relative: 7 % (ref 3–12)
NEUTROS ABS: 11.9 10*3/uL — AB (ref 1.7–7.7)
Neutrophils Relative %: 81 % — ABNORMAL HIGH (ref 43–77)
Platelets: 266 10*3/uL (ref 150–400)
RBC: 4.93 MIL/uL (ref 4.22–5.81)
RDW: 14.2 % (ref 11.5–15.5)
WBC: 14.8 10*3/uL — ABNORMAL HIGH (ref 4.0–10.5)

## 2014-12-27 LAB — COMPREHENSIVE METABOLIC PANEL
ALBUMIN: 3.5 g/dL (ref 3.5–5.0)
ALK PHOS: 64 U/L (ref 38–126)
ALT: 27 U/L (ref 17–63)
ANION GAP: 10 (ref 5–15)
AST: 24 U/L (ref 15–41)
BILIRUBIN TOTAL: 0.4 mg/dL (ref 0.3–1.2)
BUN: 17 mg/dL (ref 6–20)
CO2: 25 mmol/L (ref 22–32)
Calcium: 9.2 mg/dL (ref 8.9–10.3)
Chloride: 101 mmol/L (ref 101–111)
Creatinine, Ser: 1.48 mg/dL — ABNORMAL HIGH (ref 0.61–1.24)
GFR calc Af Amer: 58 mL/min — ABNORMAL LOW (ref >60)
GFR calc non Af Amer: 50 mL/min — ABNORMAL LOW (ref >60)
GLUCOSE: 217 mg/dL — AB (ref 65–99)
POTASSIUM: 4.2 mmol/L (ref 3.5–5.1)
Sodium: 136 mmol/L (ref 135–145)
Total Protein: 7.3 g/dL (ref 6.5–8.1)

## 2014-12-27 MED ORDER — LEVETIRACETAM 750 MG PO TABS: 750.0000 mg | ORAL_TABLET | Freq: Two times a day (BID) | ORAL | Status: DC

## 2014-12-27 MED ORDER — FUROSEMIDE 40 MG PO TABS: 40.0000 mg | ORAL_TABLET | Freq: Every day | ORAL | Status: DC

## 2014-12-27 MED ORDER — AMLODIPINE BESY-BENAZEPRIL HCL 10-20 MG PO CAPS: 1.0000 | ORAL_CAPSULE | Freq: Every day | ORAL | Status: DC

## 2014-12-27 MED ORDER — METFORMIN HCL ER 500 MG PO TB24: 500.0000 mg | ORAL_TABLET | Freq: Every day | ORAL | Status: DC

## 2014-12-27 NOTE — ED Notes (Signed)
Pt ambulated to the end of the hallway and back with stand by assist of this RN and Sherlyn Lick

## 2014-12-27 NOTE — ED Notes (Signed)
Pt verbalized understanding of d/c instructions and has no further questions. Pt stable and NAD, being d/c home with family driving.

## 2014-12-27 NOTE — Discharge Instructions (Signed)

## 2014-12-27 NOTE — ED Notes (Signed)
Pt brought in by EMS for seizure that lasted approximately an hour and a half that was full body, violent and combative with small cut to tongue and small amount of urinary incontinence.  Pt required 17.5mg  versed and 5mg  haldol before the seizure was stopped.  Pt has a brain tumor and a hx of seizures, last one was three months ago.

## 2015-01-30 ENCOUNTER — Emergency Department (HOSPITAL_COMMUNITY): Payer: Managed Care, Other (non HMO)

## 2015-01-30 ENCOUNTER — Emergency Department (HOSPITAL_COMMUNITY)
Admission: EM | Admit: 2015-01-30 | Discharge: 2015-01-30 | Disposition: A | Discharge status: Home / Self Care | Payer: Self-pay | Attending: Emergency Medicine | Admitting: Emergency Medicine

## 2015-01-30 ENCOUNTER — Encounter (HOSPITAL_COMMUNITY): Payer: Self-pay | Admitting: Emergency Medicine

## 2015-01-30 DIAGNOSIS — I10 Essential (primary) hypertension: Secondary | ICD-10-CM | POA: Insufficient documentation

## 2015-01-30 DIAGNOSIS — Z79899 Other long term (current) drug therapy: Secondary | ICD-10-CM | POA: Insufficient documentation

## 2015-01-30 DIAGNOSIS — Z8669 Personal history of other diseases of the nervous system and sense organs: Secondary | ICD-10-CM | POA: Insufficient documentation

## 2015-01-30 DIAGNOSIS — E119 Type 2 diabetes mellitus without complications: Secondary | ICD-10-CM | POA: Insufficient documentation

## 2015-01-30 DIAGNOSIS — R569 Unspecified convulsions: Secondary | ICD-10-CM

## 2015-01-30 DIAGNOSIS — G40909 Epilepsy, unspecified, not intractable, without status epilepticus: Secondary | ICD-10-CM | POA: Insufficient documentation

## 2015-01-30 LAB — CBC WITH DIFFERENTIAL/PLATELET
BASOS ABS: 0 10*3/uL (ref 0.0–0.1)
Basophils Relative: 0 %
Eosinophils Absolute: 0.7 10*3/uL (ref 0.0–0.7)
Eosinophils Relative: 6 %
HEMATOCRIT: 37.5 % — AB (ref 39.0–52.0)
HEMOGLOBIN: 13.2 g/dL (ref 13.0–17.0)
LYMPHS PCT: 16 %
Lymphs Abs: 1.9 10*3/uL (ref 0.7–4.0)
MCH: 27 pg (ref 26.0–34.0)
MCHC: 35.2 g/dL (ref 30.0–36.0)
MCV: 76.7 fL — ABNORMAL LOW (ref 78.0–100.0)
MONOS PCT: 8 %
Monocytes Absolute: 0.9 10*3/uL (ref 0.1–1.0)
Neutro Abs: 8.3 10*3/uL — ABNORMAL HIGH (ref 1.7–7.7)
Neutrophils Relative %: 70 %
Platelets: 268 10*3/uL (ref 150–400)
RBC: 4.89 MIL/uL (ref 4.22–5.81)
RDW: 13.7 % (ref 11.5–15.5)
WBC: 11.8 10*3/uL — ABNORMAL HIGH (ref 4.0–10.5)

## 2015-01-30 LAB — BASIC METABOLIC PANEL
ANION GAP: 7 (ref 5–15)
BUN: 9 mg/dL (ref 6–20)
CHLORIDE: 101 mmol/L (ref 101–111)
CO2: 28 mmol/L (ref 22–32)
Calcium: 9.1 mg/dL (ref 8.9–10.3)
Creatinine, Ser: 1.01 mg/dL (ref 0.61–1.24)
GFR calc Af Amer: >60 mL/min (ref >60)
Glucose, Bld: 143 mg/dL — ABNORMAL HIGH (ref 65–99)
POTASSIUM: 4.2 mmol/L (ref 3.5–5.1)
SODIUM: 136 mmol/L (ref 135–145)

## 2015-01-30 LAB — MAGNESIUM: Magnesium: 2 mg/dL (ref 1.7–2.4)

## 2015-01-30 LAB — PHOSPHORUS: Phosphorus: 2.9 mg/dL (ref 2.5–4.6)

## 2015-01-30 LAB — CBG MONITORING, ED: GLUCOSE-CAPILLARY: 230 mg/dL — AB (ref 65–99)

## 2015-01-30 MED ORDER — FUROSEMIDE 40 MG PO TABS: 40.0000 mg | ORAL_TABLET | Freq: Every day | ORAL | Status: DC

## 2015-01-30 MED ORDER — LEVETIRACETAM 750 MG PO TABS: 750.0000 mg | ORAL_TABLET | Freq: Two times a day (BID) | ORAL | Status: DC

## 2015-01-30 MED ORDER — AMLODIPINE BESY-BENAZEPRIL HCL 10-20 MG PO CAPS: 1.0000 | ORAL_CAPSULE | Freq: Every day | ORAL | Status: DC

## 2015-01-30 MED ORDER — ALBUTEROL SULFATE HFA 108 (90 BASE) MCG/ACT IN AERS: 2.0000 | INHALATION_SPRAY | RESPIRATORY_TRACT | Status: DC | PRN

## 2015-01-30 MED ORDER — METFORMIN HCL ER 500 MG PO TB24: 500.0000 mg | ORAL_TABLET | Freq: Every day | ORAL | Status: DC

## 2015-01-30 MED ORDER — SODIUM CHLORIDE 0.9 % IV SOLN
1000.0000 mg | Freq: Once | INTRAVENOUS | Status: AC
  Administered 2015-01-30: 1000 mg via INTRAVENOUS
  Filled 2015-01-30: qty 10

## 2015-01-30 NOTE — ED Notes (Signed)
NAD at this time. Pt is stable and going home.  

## 2015-01-30 NOTE — ED Notes (Signed)
Hx of epilepsy; ran out of seizure medications since Monday. Doesn't remember the name, but says it starts with a "L." Patient states he thinks he has had 2 seizures since he ran out of medications. Patient states he has felt congested x2 days.

## 2015-01-30 NOTE — Discharge Instructions (Signed)
Seizure, Adult A seizure is abnormal electrical activity in the brain. Seizures usually last from 30 seconds to 2 minutes. There are various types of seizures. Before a seizure, you may have a warning sensation (aura) that a seizure is about to occur. An aura may include the following symptoms:   Fear or anxiety.  Nausea.  Feeling like the room is spinning (vertigo).  Vision changes, such as seeing flashing lights or spots. Common symptoms during a seizure include:  A change in attention or behavior (altered mental status).  Convulsions with rhythmic jerking movements.  Drooling.  Rapid eye movements.  Grunting.  Loss of bladder and bowel control.  Bitter taste in the mouth.  Tongue biting. After a seizure, you may feel confused and sleepy. You may also have an injury resulting from convulsions during the seizure. HOME CARE INSTRUCTIONS   If you are given medicines, take them exactly as prescribed by your health care provider.  Keep all follow-up appointments as directed by your health care provider.  Do not swim or drive or engage in risky activity during which a seizure could cause further injury to you or others until your health care provider says it is OK.  Get adequate rest.  Teach friends and family what to do if you have a seizure. They should:  Lay you on the ground to prevent a fall.  Put a cushion under your head.  Loosen any tight clothing around your neck.  Turn you on your side. If vomiting occurs, this helps keep your airway clear.  Stay with you until you recover.  Know whether or not you need emergency care. SEEK IMMEDIATE MEDICAL CARE IF:  The seizure lasts longer than 5 minutes.  The seizure is severe or you do not wake up immediately after the seizure.  You have an altered mental status after the seizure.  You are having more frequent or worsening seizures. Someone should drive you to the emergency department or call local emergency  services (911 in [place] MAKE SURE YOU:  Understand these instructions.  Will watch your condition.  Will get help right away if you are not doing well or get worse. Document Released: 04/23/2000 Document Revised: 02/14/2013 Document Reviewed: 12/06/2012 [facility] Patient Information 2015 [place], [place]. This information is not intended to replace advice given to you by your health care provider. Make sure you discuss any questions you have with your health care provider.    Emergency Department Resource Guide 1) Find a Doctor and Pay Out of Pocket Although you won't have to find out who is covered by your insurance plan, it is a good idea to ask around and get recommendations. You will then need to call the office and see if the doctor you have chosen will accept you as a new patient and what types of options they offer for patients who are self-pay. Some doctors offer discounts or will set up payment plans for their patients who do not have insurance, but you will need to ask so you aren't surprised when you get to your appointment.  2) Contact Your Local Health Department Not all health departments have doctors that can see patients for sick visits, but many do, so it is worth a call to see if yours does. If you don't know where your local health department is, you can check in your phone book. The CDC also has a tool to help you locate your state's health department, and many state websites also have listings of all  of their local health departments.  3) Find a [facility] If your illness is not likely to be very severe or complicated, you may want to try a walk in clinic. These are popping up all over the country in pharmacies, drugstores, and shopping centers. They're usually staffed by nurse practitioners or physician assistants that have been trained to treat common illnesses and complaints. They're usually fairly quick and inexpensive. However, if you have serious medical issues or  chronic medical problems, these are probably not your best option.  No Primary Care Doctor: - Call Health Connect at  [phone] - they can help you locate a primary care doctor that  accepts your insurance, provides certain services, etc. - Physician Referral Service- [phone]  Chronic Pain Problems: Organization         Address  Phone   Notes  [facility]  [phone] Patients need to be referred by their primary care doctor.   Medication Assistance: Organization         Address  Phone   Notes  [facility] Medication [facility] [address] [place], [place] [address] [phone] --Must be a resident of [facility] -- Must have NO insurance coverage whatsoever (no Medicaid/ Medicare, etc.) -- The pt. MUST have a primary care doctor that directs their care regularly and follows them in the community   [facility]  [phone]   [employer]  [phone]    Agencies that provide inexpensive medical care: Organization         Address  Phone   Notes  [place]  [phone]   [facility]    [phone]   [facility] [place], [place] [address] [phone]   [place] [address], [place] [phone]   [facility]    [phone]   [facility]    [phone]   [place] and [place] [address], [place] Phone:  [phone], Fax:  [phone] Hours of Operation:  9 am - 6 pm, M-F.  Also accepts Medicaid/Medicare and self-pay.  [facility] for [place], [address], [place] Phone: [phone], Fax: [phone]. Hours of Operation:  8:30 am - 5:30 pm, M-F.  Also accepts Medicaid and self-pay.  [facility] [place] [address] [place] Phone: [phone]   [place], [place], [place]  [phone], Ext. [phone] Mondays & Thursdays: 7-9 AM.  First 15 patients are seen on a first come, first serve basis.    [place] Providers:  Organization         Address  Phone   Notes  [facility] [address], [place] [phone] Also accepts self-pay patients.  [facility] [id number] [place], [place]  [phone]   [place], [address], [place] [phone]   [facility] [address] [place] [phone]   Lucianne Lei [address], [place]   [phone] Only accepts [place] [facility] [place] patients after they have their name applied to their card.   Self-Pay (no insurance) in [facility]:  Organization         Address  Phone   Notes  [place]  [facility], [place] [phone]   [facility] [place] [phone]   [facility] [place]  [address], [address], [place] [phone]   [facility]/Dr. Osei-Bonsu  [address] [place] or [address], [place] [phone] Phone number for both [place] and [place] locations is the same.  Urgent [facility] and [facility] [address], [place] [phone]   [facility] [address], [place] or [address] [phone]   [facility] [address] [place] [phone], phone; [phone], fax Sees patients 1st and 3rd Saturday of every month.  Must not qualify for public or private insurance (i.e. Medicaid, Medicare,  Health Choice, Veterans' Benefits)  Household income should be no more than 200% of the poverty level The clinic cannot treat you if you are pregnant or think you are pregnant  Sexually transmitted  diseases are not treated at the clinic.

## 2015-01-30 NOTE — ED Provider Notes (Signed)
CSN: [id number]     Arrival date & time 01/30/15  [id number] History   First MD Initiated Contact with Patient 01/30/15 [phone]     Chief Complaint  Patient presents with  . Seizures  . URI     (Consider location/radiation/quality/duration/timing/severity/associated sxs/prior Treatment) HPI 59 year old male who presents with seizure. 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 Reports that he has lost his insurance after losing his job, and has not been able to seen a primary care doctor regarding refills on his seizure medications. He has been out of his Keppra since 4 days ago. Since then, he believes he has had 2 "mild" seizures. These were not witnessed, but he had 2 episodes where he felt very incoherent and "out of it" that is consistent with his post ictal periods. During this time he has had cough, congestion, with sputum production. Denies fevers, nausea or vomiting, diarrhea, abdominal pain, chest pain, or shortness of breath. Reports that he has not had any associating trauma. Denies atypical headaches, vision changes, focal weakness or numbness, gait instabilty, speech changes.    Past Medical History  Diagnosis Date  . Sleep apnea   . Seizures   . Epilepsy   . Hypertension   . Diabetes mellitus without complication   . Brain tumor     frontal   Past Surgical History  Procedure Laterality Date  . No past surgeries    . Cardiac catheterization      pt's wife denies cardiac cath   Family History  Problem Relation Age of Onset  . Family history unknown: Yes   Social History  Substance Use Topics  . Smoking status: Never Smoker   . Smokeless tobacco: Never Used  . Alcohol Use: No    Review of Systems 10/14 systems reviewed and are negative other than those stated in the HPI   Allergies  Review of patient's allergies indicates no known allergies.  Home Medications   Prior to Admission  medications   Medication Sig Start Date End Date Taking? Authorizing Kayle Correa  albuterol (PROVENTIL HFA;VENTOLIN HFA) 108 (90 BASE) MCG/ACT inhaler Inhale 1-2 puffs into the lungs every 4 (four) hours as needed for wheezing or shortness of breath. 04/14/14  Yes Abigail Harris, PA-C  amLODipine-benazepril (LOTREL) 10-20 MG per capsule Take 1 capsule by mouth daily. 12/27/14  Yes Jola Schmidt, MD  Cholecalciferol (VITAMIN D PO) Take 2 tablets by mouth daily.   Yes Historical Filbert Craze, MD  furosemide (LASIX) 40 MG tablet Take 1 tablet (40 mg total) by mouth daily. 12/27/14  Yes Jola Schmidt, MD  levETIRAcetam (KEPPRA) 750 MG tablet Take 1 tablet (750 mg total) by mouth 2 (two) times daily. 12/27/14  Yes Jola Schmidt, MD  metFORMIN (GLUCOPHAGE-XR) 500 MG 24 hr tablet Take 1 tablet (500 mg total) by mouth daily with breakfast. 12/27/14  Yes Jola Schmidt, MD  albuterol (PROVENTIL HFA;VENTOLIN HFA) 108 (90 BASE) MCG/ACT inhaler Inhale 2 puffs into the lungs every 4 (four) hours as needed for wheezing or shortness of breath. 01/30/15   Forde Dandy, MD  amLODipine-benazepril (LOTREL) 10-20 MG per capsule Take 1 capsule by mouth daily. 01/30/15   Forde Dandy, MD  furosemide (LASIX) 40 MG tablet Take 1 tablet (40 mg total) by mouth daily. 01/30/15   Forde Dandy, MD  levETIRAcetam (KEPPRA) 750 MG tablet Take 1 tablet (750 mg total) by mouth 2 (two)  times daily. 01/30/15   Forde Dandy, MD  metFORMIN (GLUCOPHAGE XR) 500 MG 24 hr tablet Take 1 tablet (500 mg total) by mouth daily with breakfast. 01/30/15   Forde Dandy, MD   BP 145/74 mmHg  Pulse 77  Temp(Src) 98.4 F (36.9 C) (Oral)  Resp 20  Ht 5\' 8"  (1.727 m)  Wt 325 lb (147.419 kg)  BMI 49.43 kg/m2  SpO2 100% Physical Exam Physical Exam  Nursing note and vitals reviewed. Constitutional: Well developed, well nourished, non-toxic, and in no acute distress Head: Normocephalic and atraumatic.  Mouth/Throat: Oropharynx is clear and moist.  Neck: Normal range  of motion. Neck supple.  Cardiovascular: Normal rate and regular rhythm.   Pulmonary/Chest: Effort normal and breath sounds normal.  Abdominal: Soft. There is no tenderness. There is no rebound and no guarding.  Musculoskeletal: Normal range of motion.  Skin: Skin is warm and dry.  Psychiatric: Cooperative  Neurological:  Alert, oriented to person, place, time, and situation. Memory grossly in tact. Fluent speech. No dysarthria or aphasia.  Cranial nerves: VF are full.  Pupils are symmetric, and reactive to light. EOMI without nystagmus. No gaze deviation. Facial muscles symmetric with activation. Sensation to light touch over face in tact bilaterally. Hearing grossly in tact. Palate elevates symmetrically. Head turn and shoulder shrug are intact. Tongue midline.  Reflexes defered.  Muscle bulk and tone normal. No pronator drift. Moves all extremities symmetrically. Sensation to light touch is in tact throughout in bilateral upper and lower extremities. Coordination reveals no dysmetria with finger to nose. Gait is narrow-based and steady. Non-ataxic.  ED Course  Procedures (including critical care time) Labs Review Labs Reviewed  CBC WITH DIFFERENTIAL/PLATELET - Abnormal; Notable for the following:    WBC 11.8 (*)    HCT 37.5 (*)    MCV 76.7 (*)    Neutro Abs 8.3 (*)    All other components within normal limits  BASIC METABOLIC PANEL - Abnormal; Notable for the following:    Glucose, Bld 143 (*)    All other components within normal limits  CBG MONITORING, ED - Abnormal; Notable for the following:    Glucose-Capillary 230 (*)    All other components within normal limits  MAGNESIUM  PHOSPHORUS    Imaging Review Dg Chest 2 View  01/30/2015   CLINICAL DATA:  Shortness of breath with productive cough for several days. Seizure disorder. Initial encounter.  EXAM: CHEST  2 VIEW  COMPARISON:  Radiographs 04/14/2014 and 04/11/2014.  FINDINGS: Slightly lesser degree of inspiration,  especially on the lateral view. The heart size and mediastinal contours are normal. The lungs are clear. There is no pleural effusion or pneumothorax. No acute osseous findings are identified.  IMPRESSION: Suboptimal inspiration.  No active cardiopulmonary process.   Electronically Signed   By: Richardean Sale M.D.   On: 01/30/2015 10:46   I have personally reviewed and evaluated these images and lab results as part of my medical decision-making.   EKG Interpretation   Date/Time:  Thursday January 30 2015 11:06:52 EDT Ventricular Rate:  75 PR Interval:  175 QRS Duration: 89 QT Interval:  394 QTC Calculation: 440 R Axis:   42 Text Interpretation:  Sinus rhythm No significant change since last  tracing Confirmed by LIU MD, DANA [phone]) on 01/30/2015 11:10:27 AM      MDM   Final diagnoses:  Seizure    In short, this is a 58 year old male with history of frontal tumor and epilepsy who  presents with seizure in the setting of being unable to take his Keppra. On arrival, he is awake, alert, and behaving appropriately. Vital signs are non-concerning. He is neuro intact. No evidence of hypoglycemia, electrolyte, or other metabolic derangements. Chest x-ray performed showing no infiltrate or other acute cardiopulmonary processes. Likely in the setting of medication noncompliance and likely viral URI that is caused his seizures. No atypical features of his seizures, unusual headaches, or new neuro complaints that would warrant imaging of his frontal tumor at this time. As of one month ago he had stable tumor on CT imaging. We did discuss that if he were to have unusual headaches, new neuro complaints, or recurrent seizures in the setting of medication noncompliance, that he should return for repeat imaging. He is given a prescription for his home medications, as well as resources for outpatient follow-up given his lack of insurance. Strict return and follow-up instructions are then reviewed. He  expressed understanding of all discharge instructions and felt comfortable with the plan of care.    Forde Dandy, MD 01/30/15 2001

## 2015-02-12 ENCOUNTER — Emergency Department (HOSPITAL_COMMUNITY)
Admission: EM | Admit: 2015-02-12 | Discharge: 2015-02-12 | Disposition: A | Discharge status: Home / Self Care | Payer: Managed Care, Other (non HMO) | Attending: Emergency Medicine | Admitting: Emergency Medicine

## 2015-02-12 ENCOUNTER — Emergency Department (HOSPITAL_COMMUNITY): Payer: Managed Care, Other (non HMO)

## 2015-02-12 ENCOUNTER — Encounter (HOSPITAL_COMMUNITY): Payer: Self-pay | Admitting: Emergency Medicine

## 2015-02-12 DIAGNOSIS — Y998 Other external cause status: Secondary | ICD-10-CM | POA: Insufficient documentation

## 2015-02-12 DIAGNOSIS — Z87898 Personal history of other specified conditions: Secondary | ICD-10-CM

## 2015-02-12 DIAGNOSIS — E119 Type 2 diabetes mellitus without complications: Secondary | ICD-10-CM | POA: Insufficient documentation

## 2015-02-12 DIAGNOSIS — W1839XA Other fall on same level, initial encounter: Secondary | ICD-10-CM | POA: Insufficient documentation

## 2015-02-12 DIAGNOSIS — S50311A Abrasion of right elbow, initial encounter: Secondary | ICD-10-CM | POA: Insufficient documentation

## 2015-02-12 DIAGNOSIS — Y9389 Activity, other specified: Secondary | ICD-10-CM | POA: Insufficient documentation

## 2015-02-12 DIAGNOSIS — G40909 Epilepsy, unspecified, not intractable, without status epilepticus: Secondary | ICD-10-CM | POA: Insufficient documentation

## 2015-02-12 DIAGNOSIS — I10 Essential (primary) hypertension: Secondary | ICD-10-CM | POA: Insufficient documentation

## 2015-02-12 DIAGNOSIS — Z79899 Other long term (current) drug therapy: Secondary | ICD-10-CM | POA: Insufficient documentation

## 2015-02-12 DIAGNOSIS — Y9289 Other specified places as the place of occurrence of the external cause: Secondary | ICD-10-CM | POA: Insufficient documentation

## 2015-02-12 DIAGNOSIS — R569 Unspecified convulsions: Secondary | ICD-10-CM

## 2015-02-12 LAB — CBC WITH DIFFERENTIAL/PLATELET
Basophils Absolute: 0.1 10*3/uL (ref 0.0–0.1)
Basophils Relative: 0 %
EOS ABS: 0.1 10*3/uL (ref 0.0–0.7)
Eosinophils Relative: 1 %
HCT: 36 % — ABNORMAL LOW (ref 39.0–52.0)
Hemoglobin: 12.3 g/dL — ABNORMAL LOW (ref 13.0–17.0)
LYMPHS ABS: 1.5 10*3/uL (ref 0.7–4.0)
Lymphocytes Relative: 8 %
MCH: 26.3 pg (ref 26.0–34.0)
MCHC: 34.2 g/dL (ref 30.0–36.0)
MCV: 76.9 fL — ABNORMAL LOW (ref 78.0–100.0)
MONOS PCT: 6 %
Monocytes Absolute: 1 10*3/uL (ref 0.1–1.0)
Neutro Abs: 15 10*3/uL — ABNORMAL HIGH (ref 1.7–7.7)
Neutrophils Relative %: 85 %
Platelets: 255 10*3/uL (ref 150–400)
RBC: 4.68 MIL/uL (ref 4.22–5.81)
RDW: 14 % (ref 11.5–15.5)
WBC: 17.7 10*3/uL — ABNORMAL HIGH (ref 4.0–10.5)

## 2015-02-12 LAB — BASIC METABOLIC PANEL WITH GFR
Anion gap: 11 (ref 5–15)
BUN: 16 mg/dL (ref 6–20)
CO2: 25 mmol/L (ref 22–32)
Calcium: 9 mg/dL (ref 8.9–10.3)
Chloride: 100 mmol/L — ABNORMAL LOW (ref 101–111)
Creatinine, Ser: 1.1 mg/dL (ref 0.61–1.24)
GFR calc Af Amer: >60 mL/min
GFR calc non Af Amer: >60 mL/min
Glucose, Bld: 167 mg/dL — ABNORMAL HIGH (ref 65–99)
Potassium: 4.2 mmol/L (ref 3.5–5.1)
Sodium: 136 mmol/L (ref 135–145)

## 2015-02-12 LAB — CBG MONITORING, ED: GLUCOSE-CAPILLARY: 178 mg/dL — AB (ref 65–99)

## 2015-02-12 MED ORDER — LEVETIRACETAM 1000 MG PO TABS: 1000.0000 mg | ORAL_TABLET | Freq: Two times a day (BID) | ORAL | Status: DC

## 2015-02-12 MED ORDER — LEVETIRACETAM 500 MG/5ML IV SOLN
1000.0000 mg | Freq: Once | INTRAVENOUS | Status: AC
  Administered 2015-02-12: 1000 mg via INTRAVENOUS
  Filled 2015-02-12 (×2): qty 10

## 2015-02-12 NOTE — ED Notes (Addendum)
Patient here after having a seizure.  Patient has history of brain tumor.  Patient was seizing when EMS gets on scene.  Patient was given 5mg  IM and then 5mg  IV of Versed.  Patient is now awake, but sleepy.  Patient has not had a seizure in about one month.  Patient can be awaken, but goes back to sleep quickly.  Patient also has small laceration to right elbow, bleeding controlled.

## 2015-02-12 NOTE — ED Notes (Signed)
Pt alert and oriented. Pt able to ambulate around room with no difficulty

## 2015-02-12 NOTE — ED Notes (Signed)
PA at bedside.

## 2015-02-12 NOTE — ED Provider Notes (Signed)
CSN: [id number]     Arrival date & time 02/12/15  0602 History   First MD Initiated Contact with Patient 02/12/15 [phone]     Chief Complaint  Patient presents with  . Seizures     (Consider location/radiation/quality/duration/timing/severity/associated sxs/prior Treatment) HPI Comments: Patient with a history of Epilepsy currently on Keppra and also a brain tumor presents today after a seizure.  His daughter who lives with him states that she observed the seizure and what she observed was his whole body rocking back in forth and him screaming.  She estimates that episodes lasted 10 minutes.  He was post ictal after the episode.  She states that he did fall, but did not hit his head.  He denies pain anywhere at this time.  He states that he has been compliant with his Keppra, but then states that he may have missed one dose.  He has a history of non compliance.  Daughter called EMS and he was given 5 mg IM Versed and 5 mg IV Versed prior to arrival.  His daughter reports that he was in his normal state of health prior to this episode.  No nausea, vomiting, fever, chills, numbness, tingling, cough, SOB, chest pain, extremity pain, neck pain, or back pain.  Patient is a 59 y.o. male presenting with seizures. The history is provided by the patient.  Seizures   Past Medical History  Diagnosis Date  . Sleep apnea   . Seizures ([place])   . Epilepsy ([place])   . Hypertension   . Diabetes mellitus without complication ([place])   . Brain tumor ([place])     frontal   Past Surgical History  Procedure Laterality Date  . No past surgeries    . Cardiac catheterization      pt's wife denies cardiac cath   Family History  Problem Relation Age of Onset  . Family history unknown: Yes   Social History  Substance Use Topics  . Smoking status: Never Smoker   . Smokeless tobacco: Never Used  . Alcohol Use: No    Review of Systems  Neurological: Positive for seizures.  All other systems reviewed and are  negative.     Allergies  Review of patient's allergies indicates no known allergies.  Home Medications   Prior to Admission medications   Medication Sig Start Date End Date Taking? Authorizing Provider  albuterol (PROVENTIL HFA;VENTOLIN HFA) 108 (90 BASE) MCG/ACT inhaler Inhale 1-2 puffs into the lungs every 4 (four) hours as needed for wheezing or shortness of breath. 04/14/14   Margarita Mail, PA-C  albuterol (PROVENTIL HFA;VENTOLIN HFA) 108 (90 BASE) MCG/ACT inhaler Inhale 2 puffs into the lungs every 4 (four) hours as needed for wheezing or shortness of breath. 01/30/15   Forde Dandy, MD  amLODipine-benazepril (LOTREL) 10-20 MG per capsule Take 1 capsule by mouth daily. 12/27/14   Jola Schmidt, MD  amLODipine-benazepril (LOTREL) 10-20 MG per capsule Take 1 capsule by mouth daily. 01/30/15   Forde Dandy, MD  Cholecalciferol (VITAMIN D PO) Take 2 tablets by mouth daily.    Historical Provider, MD  furosemide (LASIX) 40 MG tablet Take 1 tablet (40 mg total) by mouth daily. 12/27/14   Jola Schmidt, MD  furosemide (LASIX) 40 MG tablet Take 1 tablet (40 mg total) by mouth daily. 01/30/15   Forde Dandy, MD  levETIRAcetam (KEPPRA) 750 MG tablet Take 1 tablet (750 mg total) by mouth 2 (two) times daily. 12/27/14   Jola Schmidt, MD  levETIRAcetam (  KEPPRA) 750 MG tablet Take 1 tablet (750 mg total) by mouth 2 (two) times daily. 01/30/15   Forde Dandy, MD  metFORMIN (GLUCOPHAGE XR) 500 MG 24 hr tablet Take 1 tablet (500 mg total) by mouth daily with breakfast. 01/30/15   Forde Dandy, MD  metFORMIN (GLUCOPHAGE-XR) 500 MG 24 hr tablet Take 1 tablet (500 mg total) by mouth daily with breakfast. 12/27/14   Jola Schmidt, MD   BP 133/74 mmHg  Pulse 105  Temp(Src) 98.4 F (36.9 C) (Oral)  Resp 30  SpO2 95% Physical Exam  Constitutional: He appears well-developed and well-nourished.  HENT:  Head: Normocephalic and atraumatic.  Mouth/Throat: Oropharynx is clear and moist.  Eyes: EOM are normal. Pupils  are equal, round, and reactive to light.  Neck: Normal range of motion. Neck supple.  Cardiovascular: Normal rate, regular rhythm and normal heart sounds.   Pulmonary/Chest: Effort normal and breath sounds normal.  Musculoskeletal: Normal range of motion.  Linear abrasion to the right elbow.  No active bleeding  Neurological: He is alert.  Skin: Skin is warm and dry.  Psychiatric: He has a normal mood and affect.  Nursing note and vitals reviewed.   ED Course  Procedures (including critical care time) Labs Review Labs Reviewed  CBC WITH DIFFERENTIAL/PLATELET  LEVETIRACETAM LEVEL  BASIC METABOLIC PANEL  CBG MONITORING, ED    Imaging Review No results found. I have personally reviewed and evaluated these images and lab results as part of my medical decision-making.   EKG Interpretation None      MDM   Final diagnoses:  None   Patient with a history of Epilepsy and brain tumor presents today after a seizure.  He was post ictal upon arrival, however, he returned back to baseline.  No seizure activity in the ED.  Normal neurological exam.  He is currently on Keppra, which he reports that he is taking.  However, he has a history of non compliance.  Patient given loading dose of Keppra in the ED.  Labs unremarkable.  Head CT is unchanged.  Feel that the patient is stable for discharge.  Patient instructed to follow up with Neurology.  Stable for discharge.  Return precautions given.    Hyman Bible, PA-C 02/14/15 2036  Sharlett Iles, MD 02/14/15 2322

## 2015-02-14 LAB — LEVETIRACETAM LEVEL: LEVETIRACETAM: 1.4 ug/mL — AB (ref 10.0–40.0)

## 2015-03-01 ENCOUNTER — Emergency Department (HOSPITAL_COMMUNITY)
Admission: EM | Admit: 2015-03-01 | Discharge: 2015-03-01 | Disposition: A | Discharge status: Home / Self Care | Payer: Managed Care, Other (non HMO) | Attending: Emergency Medicine | Admitting: Emergency Medicine

## 2015-03-01 ENCOUNTER — Emergency Department (HOSPITAL_COMMUNITY): Payer: Self-pay

## 2015-03-01 ENCOUNTER — Encounter (HOSPITAL_COMMUNITY): Payer: Self-pay | Admitting: Nurse Practitioner

## 2015-03-01 ENCOUNTER — Emergency Department (HOSPITAL_COMMUNITY): Payer: Managed Care, Other (non HMO)

## 2015-03-01 DIAGNOSIS — R1012 Left upper quadrant pain: Secondary | ICD-10-CM | POA: Insufficient documentation

## 2015-03-01 DIAGNOSIS — R140 Abdominal distension (gaseous): Secondary | ICD-10-CM | POA: Insufficient documentation

## 2015-03-01 DIAGNOSIS — I10 Essential (primary) hypertension: Secondary | ICD-10-CM | POA: Insufficient documentation

## 2015-03-01 DIAGNOSIS — Z9889 Other specified postprocedural states: Secondary | ICD-10-CM | POA: Insufficient documentation

## 2015-03-01 DIAGNOSIS — E669 Obesity, unspecified: Secondary | ICD-10-CM | POA: Insufficient documentation

## 2015-03-01 DIAGNOSIS — R079 Chest pain, unspecified: Secondary | ICD-10-CM | POA: Insufficient documentation

## 2015-03-01 DIAGNOSIS — R51 Headache: Secondary | ICD-10-CM

## 2015-03-01 DIAGNOSIS — E119 Type 2 diabetes mellitus without complications: Secondary | ICD-10-CM | POA: Insufficient documentation

## 2015-03-01 DIAGNOSIS — R519 Headache, unspecified: Secondary | ICD-10-CM

## 2015-03-01 DIAGNOSIS — G40909 Epilepsy, unspecified, not intractable, without status epilepticus: Secondary | ICD-10-CM | POA: Insufficient documentation

## 2015-03-01 DIAGNOSIS — Z79899 Other long term (current) drug therapy: Secondary | ICD-10-CM | POA: Insufficient documentation

## 2015-03-01 DIAGNOSIS — G8929 Other chronic pain: Secondary | ICD-10-CM | POA: Insufficient documentation

## 2015-03-01 DIAGNOSIS — D329 Benign neoplasm of meninges, unspecified: Secondary | ICD-10-CM | POA: Insufficient documentation

## 2015-03-01 LAB — CBC WITH DIFFERENTIAL/PLATELET
Basophils Absolute: 0.1 10*3/uL (ref 0.0–0.1)
Basophils Relative: 0 %
EOS ABS: 0.4 10*3/uL (ref 0.0–0.7)
EOS PCT: 3 %
HCT: 38.4 % — ABNORMAL LOW (ref 39.0–52.0)
Hemoglobin: 13.7 g/dL (ref 13.0–17.0)
LYMPHS ABS: 2 10*3/uL (ref 0.7–4.0)
LYMPHS PCT: 16 %
MCH: 27.3 pg (ref 26.0–34.0)
MCHC: 35.7 g/dL (ref 30.0–36.0)
MCV: 76.6 fL — AB (ref 78.0–100.0)
MONO ABS: 0.9 10*3/uL (ref 0.1–1.0)
Monocytes Relative: 8 %
Neutro Abs: 8.8 10*3/uL — ABNORMAL HIGH (ref 1.7–7.7)
Neutrophils Relative %: 73 %
PLATELETS: 290 10*3/uL (ref 150–400)
RBC: 5.01 MIL/uL (ref 4.22–5.81)
RDW: 14 % (ref 11.5–15.5)
WBC: 12.2 10*3/uL — ABNORMAL HIGH (ref 4.0–10.5)

## 2015-03-01 LAB — COMPREHENSIVE METABOLIC PANEL
ALBUMIN: 3.4 g/dL — AB (ref 3.5–5.0)
ALK PHOS: 82 U/L (ref 38–126)
ALT: 21 U/L (ref 17–63)
ANION GAP: 9 (ref 5–15)
AST: 19 U/L (ref 15–41)
BILIRUBIN TOTAL: 0.3 mg/dL (ref 0.3–1.2)
BUN: 13 mg/dL (ref 6–20)
CALCIUM: 9.7 mg/dL (ref 8.9–10.3)
CO2: 29 mmol/L (ref 22–32)
CREATININE: 1.24 mg/dL (ref 0.61–1.24)
Chloride: 100 mmol/L — ABNORMAL LOW (ref 101–111)
GFR calc non Af Amer: >60 mL/min (ref >60)
GLUCOSE: 146 mg/dL — AB (ref 65–99)
Potassium: 3.9 mmol/L (ref 3.5–5.1)
SODIUM: 138 mmol/L (ref 135–145)
TOTAL PROTEIN: 7.7 g/dL (ref 6.5–8.1)

## 2015-03-01 LAB — LIPASE, BLOOD: Lipase: 22 U/L (ref 11–51)

## 2015-03-01 LAB — TROPONIN I: Troponin I: 0.05 ng/mL — ABNORMAL HIGH (ref <0.031)

## 2015-03-01 MED ORDER — ONDANSETRON 4 MG PO TBDP
4.0000 mg | ORAL_TABLET | Freq: Once | ORAL | Status: AC
  Administered 2015-03-01: 4 mg via ORAL
  Filled 2015-03-01: qty 1

## 2015-03-01 MED ORDER — ASPIRIN 81 MG PO CHEW
324.0000 mg | CHEWABLE_TABLET | Freq: Once | ORAL | Status: AC
  Administered 2015-03-01: 324 mg via ORAL
  Filled 2015-03-01: qty 4

## 2015-03-01 MED ORDER — FENTANYL CITRATE (PF) 100 MCG/2ML IJ SOLN
50.0000 ug | Freq: Once | INTRAMUSCULAR | Status: AC
  Administered 2015-03-01: 50 ug via INTRAVENOUS
  Filled 2015-03-01: qty 2

## 2015-03-01 NOTE — Discharge Instructions (Signed)
Please follow up with your neurologist within the next 2 weeks. CT scan today showed no changes in your tumor but it is very important that you see your neurologist regularly.  You were also evaluated for chest pain. Workup today indicates that it is not likely due to your heart.   You should return to the emergency room immediately for any concerning symptoms such as worsening chest pain, extreme headache, seizures, etc.  Please obtain all of your results from medical records or have your doctors office obtain the results - share them with your doctor - you should be seen at your doctors office in the next 2 days. Call today to arrange your follow up. Take the medications as prescribed. Please review all of the medicines and only take them if you do not have an allergy to them. Please be aware that if you are taking birth control pills, taking other prescriptions, ESPECIALLY ANTIBIOTICS may make the birth control ineffective - if this is the case, either do not engage in sexual activity or use alternative methods of birth control such as condoms until you have finished the medicine and your family doctor says it is OK to restart them. If you are on a blood thinner such as COUMADIN, be aware that any other medicine that you take may cause the coumadin to either work too much, or not enough - you should have your coumadin level rechecked in next 7 days if this is the case.  ?  It is also a possibility that you have an allergic reaction to any of the medicines that you have been prescribed - Everybody reacts differently to medications and while MOST people have no trouble with most medicines, you may have a reaction such as nausea, vomiting, rash, swelling, shortness of breath. If this is the case, please stop taking the medicine immediately and contact your physician.  ?  You should return to the ER if you develop severe or worsening symptoms.

## 2015-03-01 NOTE — ED Provider Notes (Signed)
CSN: [id number]     Arrival date & time 03/01/15  1439 History   None    Chief Complaint  Patient presents with  . Headache    HPI  Thomas [name] is an 59 y.o. male with h/o frontal meningioma, epilepsy, OSA, HTN, and DM who presents to the ED for evaluation of headache. He states he has had a constant headache since 10/18. He reports the onset was gradual and feels like the headache goes from his occipital region up to his frontal region. He states that Tylenol has helped relieve the pain a little bit at times but there has not been any time when he has not had a headache over the past few days. Thomas [name] reports there have been a few episodes of complete vision loss associated with the headache and some periods blurred vision. These episodes last a few seconds and resolve on their own. Denies photophobia or phonophobia. Denies vertigo, hearing changes, numbness, or tingling. He was last seen in this ED on 10/5 following a seizure and CT at that time showed no change in his tumor. He is not sure if he has had a seizure since then. He does report that he has had some left-sided chest pain over the past few days as well. States that the episodes of pain last a few minutes and resolve on their own. Sometimes it feels like the pain is down in his LUQ and sometimes is associated with meals. States these episodes happen sporadically and are not associated with exertion or any other activity.  Denies diaphoresis or SOB. Denies changes in behavior or personality. Thomas [name] is also concerned because he feels he has been sleeping more than usual. He states he normally sleeps ~8h a night and the past few days has been sleeping ~15-16h. He has noticed some increased dry cough but no other URI symptoms. Denies fever, chills, N/V/D. Denies abdominal pain or loss of bowel/bladder control.   Past Medical History  Diagnosis Date  . Sleep apnea   . Seizures ([place])   . Epilepsy ([place])   . Hypertension   . Diabetes mellitus  without complication ([place])   . Brain tumor ([place])     frontal   Past Surgical History  Procedure Laterality Date  . No past surgeries    . Cardiac catheterization      pt's wife denies cardiac cath   Family History  Problem Relation Age of Onset  . Family history unknown: Yes   Social History  Substance Use Topics  . Smoking status: Never Smoker   . Smokeless tobacco: Never Used  . Alcohol Use: No    Review of Systems  All other systems reviewed and are negative.     Allergies  Review of patient's allergies indicates no known allergies.  Home Medications   Prior to Admission medications   Medication Sig Start Date End Date Taking? Authorizing Provider  albuterol (PROVENTIL HFA;VENTOLIN HFA) 108 (90 BASE) MCG/ACT inhaler Inhale 1-2 puffs into the lungs every 4 (four) hours as needed for wheezing or shortness of breath. 04/14/14   Margarita Mail, PA-C  albuterol (PROVENTIL HFA;VENTOLIN HFA) 108 (90 BASE) MCG/ACT inhaler Inhale 2 puffs into the lungs every 4 (four) hours as needed for wheezing or shortness of breath. 01/30/15   Forde Dandy, MD  amLODipine-benazepril (LOTREL) 10-20 MG per capsule Take 1 capsule by mouth daily. 12/27/14   Jola Schmidt, MD  amLODipine-benazepril (LOTREL) 10-20 MG per capsule Take 1 capsule by mouth  daily. 01/30/15   Forde Dandy, MD  Cholecalciferol (VITAMIN D PO) Take 2 tablets by mouth daily.    Historical Provider, MD  furosemide (LASIX) 40 MG tablet Take 1 tablet (40 mg total) by mouth daily. 12/27/14   Jola Schmidt, MD  furosemide (LASIX) 40 MG tablet Take 1 tablet (40 mg total) by mouth daily. 01/30/15   Forde Dandy, MD  levETIRAcetam (KEPPRA) 1000 MG tablet Take 1 tablet (1,000 mg total) by mouth 2 (two) times daily. 02/12/15   Hyman Bible, PA-C  metFORMIN (GLUCOPHAGE XR) 500 MG 24 hr tablet Take 1 tablet (500 mg total) by mouth daily with breakfast. 01/30/15   Forde Dandy, MD  metFORMIN (GLUCOPHAGE-XR) 500 MG 24 hr tablet Take 1 tablet (500  mg total) by mouth daily with breakfast. 12/27/14   Jola Schmidt, MD   BP 155/83 mmHg  Pulse 90  Temp(Src) 98.9 F (37.2 C) (Oral)  Resp 16  Ht 5\' 8"  (1.727 m)  Wt 325 lb 4.8 oz (147.555 kg)  BMI 49.47 kg/m2  SpO2 95% Physical Exam  Constitutional: He is oriented to person, place, and time.  Obese. NAD.  HENT:  Right Ear: External ear normal.  Left Ear: External ear normal.  Nose: Nose normal.  Mouth/Throat: Oropharynx is clear and moist. No oropharyngeal exudate.  Eyes: Conjunctivae and EOM are normal. Pupils are equal, round, and reactive to light.  Neck: Normal range of motion. Neck supple.  Cardiovascular: Normal rate, regular rhythm, normal heart sounds and intact distal pulses.   No murmur heard. Pulmonary/Chest: Effort normal and breath sounds normal. No respiratory distress. He has no wheezes. He has no rales. He exhibits tenderness.  Abdominal: Soft. Bowel sounds are normal. He exhibits distension. There is tenderness. There is no rebound and no guarding.  Tender LUQ. Pt's belly is distended but reports this is chronic.   Musculoskeletal:  1+ pitting edema bilat LE  Lymphadenopathy:    He has no cervical adenopathy.  Neurological: He is alert and oriented to person, place, and time. He has normal reflexes. No cranial nerve deficit. Coordination normal.  Skin: Skin is warm and dry.  Psychiatric: He has a normal mood and affect. His behavior is normal.  Nursing note and vitals reviewed.   ED Course  Procedures (including critical care time) Labs Review Labs Reviewed  COMPREHENSIVE METABOLIC PANEL - Abnormal; Notable for the following:    Chloride 100 (*)    Glucose, Bld 146 (*)    Albumin 3.4 (*)    All other components within normal limits  TROPONIN I - Abnormal; Notable for the following:    Troponin I 0.05 (*)    All other components within normal limits  CBC WITH DIFFERENTIAL/PLATELET - Abnormal; Notable for the following:    WBC 12.2 (*)    HCT 38.4 (*)     MCV 76.6 (*)    Neutro Abs 8.8 (*)    All other components within normal limits  LIPASE, BLOOD  TROPONIN I    Imaging Review Dg Chest 2 View  03/01/2015  CLINICAL DATA:  Headaches.  History of frontal tumor. EXAM: CHEST  2 VIEW COMPARISON:  January 30, 2015. FINDINGS: The heart size and mediastinal contours are within normal limits. Both lungs are clear. No pneumothorax or pleural effusion is noted. The visualized skeletal structures are unremarkable. IMPRESSION: No active cardiopulmonary disease. Electronically Signed   By: Marijo Conception, M.D.   On: 03/01/2015 16:29   Ct Head Wo  Contrast  03/01/2015  CLINICAL DATA:  Headache for several days. History of frontal brain tumor. EXAM: CT HEAD WITHOUT CONTRAST TECHNIQUE: Contiguous axial images were obtained from the base of the skull through the vertex without intravenous contrast. COMPARISON:  CT scan of February 12, 2015. FINDINGS: Bony calvarium appears intact. Right maxillary mucous retention cyst is noted. Mild diffuse cortical atrophy is noted. No midline shift is noted. Ventricular size is within normal limits. No hemorrhage is noted. Stable left frontal meningioma is noted. Stable white matter edema is seen in left frontal lobe. No acute infarction is noted. IMPRESSION: Mild diffuse cortical atrophy. Stable left frontal meningioma is noted with stable white matter edema seen in the left frontal lobe. No significant changes noted compared to prior exam. Electronically Signed   By: Marijo Conception, M.D.   On: 03/01/2015 16:22   I have personally reviewed and evaluated these images and lab results as part of my medical decision-making.   EKG Interpretation   Date/Time:  Saturday March 01 2015 16:19:40 EDT Ventricular Rate:  86 PR Interval:  173 QRS Duration: 80 QT Interval:  367 QTC Calculation: 439 R Axis:   82 Text Interpretation:  Sinus rhythm Borderline T abnormalities, lateral  leads new t wave inversions in the lateral  leads and t wave flattening in  the inferior leads Confirmed by Kathrynn Humble, MD, Thelma Comp [phone]) on 03/01/2015  5:21:09 PM      MDM   Final diagnoses:  Meningioma ([place])  Headache, unspecified headache type  Chest pain, unspecified chest pain type    Concern for bleed or tumor changes given new headache with vision changes. Will get head CT. Other symptoms do not seem likely to be related. Chest pain workup also started with EKG, troponin, and CXR given pt's history of HTN, OSA, and noncompliance with medications. However, can also consider GERD, anxiety, MSK pain. Will check basic labs. Holding on ketorolac until CT scan. Will give fentanyl as pt endorses 7/10 headache in the ED now.    CT shows no change in pt's tumor. Initial troponin 0.05 so repeated and it is negative. Workup is otherwise unremarkable. Patient states his headache is now resolved. Discussed results with pt. Instructed to f/u with neuro outpatient. Return precautions given. Pt voiced his understanding.    Anne Ng, PA-C 03/01/15 2008  Varney Biles, MD 03/03/15 2021

## 2015-03-01 NOTE — ED Notes (Signed)
He was diagnosed with a frontal tumor about 1 year ago, has not been following up with anyone about the tumor. hes had worsening headaches and sleeping more this week so he decided to come for evaluation. He repots complete vision loss lasting for about 15 seconds and seeing yellow spots several days ago. He is A&Ox4, rsep e/u. H=

## 2015-04-09 ENCOUNTER — Encounter (HOSPITAL_BASED_OUTPATIENT_CLINIC_OR_DEPARTMENT_OTHER): Payer: Self-pay

## 2015-04-09 ENCOUNTER — Emergency Department (HOSPITAL_BASED_OUTPATIENT_CLINIC_OR_DEPARTMENT_OTHER): Payer: Self-pay

## 2015-04-09 ENCOUNTER — Emergency Department (HOSPITAL_BASED_OUTPATIENT_CLINIC_OR_DEPARTMENT_OTHER)
Admission: EM | Admit: 2015-04-09 | Discharge: 2015-04-09 | Disposition: A | Discharge status: Home / Self Care | Payer: Self-pay | Attending: Emergency Medicine | Admitting: Emergency Medicine

## 2015-04-09 ENCOUNTER — Emergency Department (HOSPITAL_BASED_OUTPATIENT_CLINIC_OR_DEPARTMENT_OTHER): Payer: Managed Care, Other (non HMO)

## 2015-04-09 DIAGNOSIS — R05 Cough: Secondary | ICD-10-CM | POA: Insufficient documentation

## 2015-04-09 DIAGNOSIS — I10 Essential (primary) hypertension: Secondary | ICD-10-CM | POA: Insufficient documentation

## 2015-04-09 DIAGNOSIS — Z87891 Personal history of nicotine dependence: Secondary | ICD-10-CM | POA: Insufficient documentation

## 2015-04-09 DIAGNOSIS — R2243 Localized swelling, mass and lump, lower limb, bilateral: Secondary | ICD-10-CM | POA: Insufficient documentation

## 2015-04-09 DIAGNOSIS — G8929 Other chronic pain: Secondary | ICD-10-CM

## 2015-04-09 DIAGNOSIS — R0602 Shortness of breath: Secondary | ICD-10-CM | POA: Insufficient documentation

## 2015-04-09 DIAGNOSIS — H539 Unspecified visual disturbance: Secondary | ICD-10-CM | POA: Insufficient documentation

## 2015-04-09 DIAGNOSIS — G40909 Epilepsy, unspecified, not intractable, without status epilepticus: Secondary | ICD-10-CM | POA: Insufficient documentation

## 2015-04-09 DIAGNOSIS — Z9889 Other specified postprocedural states: Secondary | ICD-10-CM | POA: Insufficient documentation

## 2015-04-09 DIAGNOSIS — Z760 Encounter for issue of repeat prescription: Secondary | ICD-10-CM | POA: Insufficient documentation

## 2015-04-09 DIAGNOSIS — E119 Type 2 diabetes mellitus without complications: Secondary | ICD-10-CM | POA: Insufficient documentation

## 2015-04-09 DIAGNOSIS — D329 Benign neoplasm of meninges, unspecified: Secondary | ICD-10-CM | POA: Insufficient documentation

## 2015-04-09 DIAGNOSIS — R51 Headache: Secondary | ICD-10-CM

## 2015-04-09 DIAGNOSIS — Z86011 Personal history of benign neoplasm of the brain: Secondary | ICD-10-CM | POA: Insufficient documentation

## 2015-04-09 LAB — COMPREHENSIVE METABOLIC PANEL
ALBUMIN: 3.5 g/dL (ref 3.5–5.0)
ALK PHOS: 69 U/L (ref 38–126)
ALT: 26 U/L (ref 17–63)
ANION GAP: 7 (ref 5–15)
AST: 18 U/L (ref 15–41)
BILIRUBIN TOTAL: 0.3 mg/dL (ref 0.3–1.2)
BUN: 12 mg/dL (ref 6–20)
CALCIUM: 9.3 mg/dL (ref 8.9–10.3)
CO2: 30 mmol/L (ref 22–32)
Chloride: 98 mmol/L — ABNORMAL LOW (ref 101–111)
Creatinine, Ser: 1.02 mg/dL (ref 0.61–1.24)
GFR calc Af Amer: >60 mL/min (ref >60)
GLUCOSE: 145 mg/dL — AB (ref 65–99)
POTASSIUM: 4 mmol/L (ref 3.5–5.1)
Sodium: 135 mmol/L (ref 135–145)
TOTAL PROTEIN: 7.4 g/dL (ref 6.5–8.1)

## 2015-04-09 LAB — CBG MONITORING, ED: Glucose-Capillary: 161 mg/dL — ABNORMAL HIGH (ref 65–99)

## 2015-04-09 LAB — CBC
HEMATOCRIT: 36 % — AB (ref 39.0–52.0)
HEMOGLOBIN: 12.7 g/dL — AB (ref 13.0–17.0)
MCH: 26.5 pg (ref 26.0–34.0)
MCHC: 35.3 g/dL (ref 30.0–36.0)
MCV: 75 fL — AB (ref 78.0–100.0)
Platelets: 291 10*3/uL (ref 150–400)
RBC: 4.8 MIL/uL (ref 4.22–5.81)
RDW: 14.3 % (ref 11.5–15.5)
WBC: 11 10*3/uL — ABNORMAL HIGH (ref 4.0–10.5)

## 2015-04-09 LAB — TROPONIN I

## 2015-04-09 MED ORDER — ALBUTEROL SULFATE HFA 108 (90 BASE) MCG/ACT IN AERS: 2.0000 | INHALATION_SPRAY | RESPIRATORY_TRACT | Status: DC | PRN

## 2015-04-09 MED ORDER — LEVETIRACETAM 1000 MG PO TABS: 1000.0000 mg | ORAL_TABLET | Freq: Two times a day (BID) | ORAL | Status: DC

## 2015-04-09 MED ORDER — AMLODIPINE BESY-BENAZEPRIL HCL 10-20 MG PO CAPS: 1.0000 | ORAL_CAPSULE | Freq: Every day | ORAL | Status: DC

## 2015-04-09 MED ORDER — FUROSEMIDE 40 MG PO TABS
40.0000 mg | ORAL_TABLET | Freq: Every day | ORAL | Status: DC
Start: 2015-04-09 — End: 2015-06-14

## 2015-04-09 MED ORDER — AMLODIPINE BESYLATE 5 MG PO TABS
10.0000 mg | ORAL_TABLET | Freq: Once | ORAL | Status: AC
  Administered 2015-04-09: 10 mg via ORAL
  Filled 2015-04-09: qty 2

## 2015-04-09 MED ORDER — BENAZEPRIL HCL 20 MG PO TABS
20.0000 mg | ORAL_TABLET | Freq: Once | ORAL | Status: DC
  Filled 2015-04-09: qty 1

## 2015-04-09 NOTE — ED Provider Notes (Signed)
CSN: [id number]     Arrival date & time 04/09/15  1222 History   First MD Initiated Contact with Patient 04/09/15 1307     Chief Complaint  Patient presents with  . Hypertension     (Consider location/radiation/quality/duration/timing/severity/associated sxs/prior Treatment) The history is provided by the patient and medical records. No language interpreter was used.     Thomas [name] is a 59 y.o. male  with a hx of sleep apnea, seizures, hypertension, non-insulin-dependent diabetes presents to the Emergency Department complaining of gradual, persistent, progressively worsening headaches onset 1 year ago.  Pt reports left sided headache unchanged within the last year.  Associated symptoms include lightheadedness, dizziness (room spinning) feeling of being off balance or stumbling beginning several days ago.  Pt reports he has been without his BP medications for the last week and he feels his symptoms are due to this.  Nothing seems to make his symptoms better or worse.  Pt denies chest pain, SOB, abd pain, N/V/D, weakness, syncope.  Pt also reports some changes in his vision described as intermittent blurriness and diplopia only with laughing presents for numerous months.    Intermittent SOB resolved each time with inhaler usage in the last several weeks.  Pt with nighttime cough for several weeks which sometimes causes post-tussive vomiting.  Pt reports this always occurs after eating a large meal and lying down.   Pt also with bilateral ankle swelling for the last 2 years, but are unchanged today or this week.  He does take lasix for this and has been compliant.      Pt reports he had a seizure on 04-09-14.  He reports a total of 5 grand mal seizures in the last year.  Pt reports seizures as a child with no further after the age of 42 until last year.  Pt denies any grand mal seizures in the last month.  Pt reports taking Keppra for this - he has been compliant with this as he has not run out.     Past Medical History  Diagnosis Date  . Sleep apnea   . Seizures ([place])   . Epilepsy ([place])   . Hypertension   . Diabetes mellitus without complication ([place])   . Brain tumor ([place])     frontal   Past Surgical History  Procedure Laterality Date  . No past surgeries    . Cardiac catheterization      pt's wife denies cardiac cath   Family History  Problem Relation Age of Onset  . Family history unknown: Yes   Social History  Substance Use Topics  . Smoking status: Former Research scientist (life sciences)  . Smokeless tobacco: Never Used  . Alcohol Use: No    Review of Systems  Constitutional: Negative for fever, diaphoresis, appetite change, fatigue and unexpected weight change.  HENT: Negative for mouth sores.   Eyes: Positive for visual disturbance.  Respiratory: Positive for cough and shortness of breath. Negative for chest tightness and wheezing.   Cardiovascular: Positive for leg swelling. Negative for chest pain.  Gastrointestinal: Positive for nausea and vomiting. Negative for abdominal pain, diarrhea and constipation.  Endocrine: Negative for polydipsia, polyphagia and polyuria.  Genitourinary: Negative for dysuria, urgency, frequency and hematuria.  Musculoskeletal: Negative for back pain and neck stiffness.  Skin: Negative for rash.  Allergic/Immunologic: Negative for immunocompromised state.  Neurological: Positive for dizziness, light-headedness and headaches. Negative for syncope.  Hematological: Does not bruise/bleed easily.  Psychiatric/Behavioral: Negative for sleep disturbance. The patient is not nervous/anxious.  Allergies  Review of patient's allergies indicates no known allergies.  Home Medications   Prior to Admission medications   Medication Sig Start Date End Date Taking? Authorizing Provider  acetaminophen (TYLENOL) 325 MG tablet Take 650 mg by mouth every 6 (six) hours as needed for moderate pain.    Historical Provider, MD  albuterol (PROVENTIL HFA;VENTOLIN  HFA) 108 (90 BASE) MCG/ACT inhaler Inhale 2 puffs into the lungs every 4 (four) hours as needed for wheezing or shortness of breath. 04/09/15   Shandon Burlingame, PA-C  Alum Hydroxide-Mag Carbonate (HEARTBURN ANTACID EX ST) 160-105 MG CHEW Chew 1 tablet by mouth 3 (three) times daily as needed (for heartburn).    Historical Provider, MD  amLODipine-benazepril (LOTREL) 10-20 MG capsule Take 1 capsule by mouth daily. 04/09/15   Nydia Ytuarte, PA-C  furosemide (LASIX) 40 MG tablet Take 1 tablet (40 mg total) by mouth daily. 04/09/15   Nellie Chevalier, PA-C  levETIRAcetam (KEPPRA) 1000 MG tablet Take 1 tablet (1,000 mg total) by mouth 2 (two) times daily. 04/09/15   Brinda Focht, PA-C  Menthol-Ascorbic Acid (LUDENS COUGH DROPS MT) Use as directed 1 drop in the mouth or throat daily as needed (for sore throat).    Historical Provider, MD   BP 157/97 mmHg  Pulse 78  Temp(Src) 98.1 F (36.7 C) (Oral)  Resp 20  Ht 5\' 8"  (1.727 m)  Wt 151.501 kg  BMI 50.80 kg/m2  SpO2 99% Physical Exam  Constitutional: He is oriented to person, place, and time. He appears well-developed and well-nourished. No distress.  obese  HENT:  Head: Normocephalic and atraumatic.  Mouth/Throat: Oropharynx is clear and moist.  Eyes: Conjunctivae and EOM are normal. Pupils are equal, round, and reactive to light. No scleral icterus.  No horizontal, vertical or rotational nystagmus  Neck: Normal range of motion. Neck supple.  Full active and passive ROM without pain No midline or paraspinal tenderness No nuchal rigidity or meningeal signs  Cardiovascular: Normal rate, regular rhythm, normal heart sounds and intact distal pulses.   No murmur heard. Pulmonary/Chest: Effort normal and breath sounds normal. No respiratory distress. He has no wheezes. He has no rales.  Abdominal: Soft. Bowel sounds are normal. There is no tenderness. There is no rebound and no guarding.  Obese  Musculoskeletal: Normal range  of motion. He exhibits edema.  1+ pitting edema bilaterally  Lymphadenopathy:    He has no cervical adenopathy.  Neurological: He is alert and oriented to person, place, and time. He has normal reflexes. No cranial nerve deficit. He exhibits normal muscle tone. Coordination normal.  Mental Status:  Alert, oriented, thought content appropriate. Speech fluent without evidence of aphasia. Able to follow 2 step commands without difficulty.  Cranial Nerves:  II:  Peripheral visual fields grossly normal, pupils equal, round, reactive to light III,IV, VI: ptosis not present, extra-ocular motions intact bilaterally  V,VII: smile symmetric, facial light touch sensation equal VIII: hearing grossly normal bilaterally  IX,X: midline uvula rise  XI: bilateral shoulder shrug equal and strong XII: midline tongue extension  Motor:  5/5 in upper and lower extremities bilaterally including strong and equal grip strength and dorsiflexion/plantar flexion Sensory: Pinprick and light touch normal in all extremities.  Deep Tendon Reflexes: 2+ and symmetric  Cerebellar: normal finger-to-nose with bilateral upper extremities Gait: normal gait and balance CV: distal pulses palpable throughout   Skin: Skin is warm and dry. No rash noted. He is not diaphoretic.  Psychiatric: He has a normal mood and affect.  His behavior is normal. Judgment and thought content normal.  Nursing note and vitals reviewed.   ED Course  Procedures (including critical care time) Labs Review Labs Reviewed  CBC - Abnormal; Notable for the following:    WBC 11.0 (*)    Hemoglobin 12.7 (*)    HCT 36.0 (*)    MCV 75.0 (*)    All other components within normal limits  COMPREHENSIVE METABOLIC PANEL - Abnormal; Notable for the following:    Chloride 98 (*)    Glucose, Bld 145 (*)    All other components within normal limits  CBG MONITORING, ED - Abnormal; Notable for the following:    Glucose-Capillary 161 (*)    All other  components within normal limits  TROPONIN I    Imaging Review Dg Chest 2 View  04/09/2015  CLINICAL DATA:  Cough.  Vomiting.  Headache.  Hypertension. EXAM: CHEST  2 VIEW COMPARISON:  02/19/2015 FINDINGS: The heart size and mediastinal contours are within normal limits. Both lungs are clear. No evidence of pneumothorax or pleural effusion. Mild pleural thickening along the right lateral chest wall is stable. The visualized skeletal structures are unremarkable. IMPRESSION: No active cardiopulmonary disease. Electronically Signed   By: Earle Gell M.D.   On: 04/09/2015 14:03   Ct Head Wo Contrast  04/09/2015  CLINICAL DATA:  59 year old male with headache dizziness for several days. Ran out of blood pressure medications a week ago. Meningioma. Initial encounter. EXAM: CT HEAD WITHOUT CONTRAST TECHNIQUE: Contiguous axial images were obtained from the base of the skull through the vertex without intravenous contrast. COMPARISON:  03/01/2015.  Brain MRI 07/24/2014. FINDINGS: Chronic mild right maxillary sinus mucosal thickening. Other Visualized paranasal sinuses and mastoids are clear. No acute osseous abnormality identified. No acute orbit or scalp soft tissue findings. Calcified atherosclerosis at the skull base. Chronic isodense to slightly hypodense left convexity meningioma is again apparent. This was best demonstrated on postcontrast images 316 2016 measuring up to 4 cm largest dimension. As before there is mild associated edema in the adjacent left hemisphere. No significant mass effect. No midline shift. No ventriculomegaly. Stable cerebral volume. No acute intracranial hemorrhage identified. No cortically based acute infarct identified. IMPRESSION: 1. Stable non contrast CT appearance of the brain. Chronic left lateral meningioma with mild cerebral edema, no mass effect. 2. No new intracranial abnormality identified. Electronically Signed   By: Genevie Ann M.D.   On: 04/09/2015 15:13   I have  personally reviewed and evaluated these images and lab results as part of my medical decision-making.   EKG Interpretation   Date/Time:  Wednesday April 09 2015 12:37:24 EST Ventricular Rate:  88 PR Interval:  164 QRS Duration: 76 QT Interval:  370 QTC Calculation: 447 R Axis:   59 Text Interpretation:  Normal sinus rhythm Nonspecific T wave abnormality  Lateral leads Abnormal ECG Inferior leads T wave abnormality improved from  prior Confirmed by KNOTT MD, Quillian Quince [id number]) on 04/09/2015 12:45:13 PM      MDM   Final diagnoses:  Medication refill  Chronic nonintractable headache, unspecified headache type  Essential hypertension  Meningioma (HCC)   TRAYSHAUN CLERE presents with persistence of his chronic headache, lightheadedness, dizziness and other intermittent symptoms. Record review shows the patient has a history of meningioma. He reports that he has been unable to afford the surgery to remove it. He also reports that he's been unable to afford a follow-up with his primary care physician therefore he's out of his blood  pressure medications.  Patient without chest pain, shortness of breath or other signs or symptoms of hypertensive urgency.  CT head shows stable edema from his meningioma; no evidence of subarachnoid hemorrhage or other intracranial pathology. Patient with mild leukocytosis at baseline. Mild hyperglycemia at 161 and negative troponin. EKG is nonspecific but improved from prior. Patient ambulates without difficulty. He is a normal neurologic exam though he reports he feels off balance while ambulating. No gait disturbance. Chest x-rays without acute abnormality. No evidence of pneumonia.  Patient's nighttime coughing is likely secondary to reflux.  He has an inhaler which helps his shortness of breath as needed.  Discussed with patient the importance of following up with primary care. I have refilled all of his medications at this time. [facility]. He is to return to the emergency department for development of new or worsening symptoms.  The patient was discussed with and seen by Dr. Laneta Simmers who agrees with the treatment plan.  BP 157/97 mmHg  Pulse 78  Temp(Src) 98.1 F (36.7 C) (Oral)  Resp 20  Ht 5\' 8"  (1.727 m)  Wt 151.501 kg  BMI 50.80 kg/m2  SpO2 99%    Abigail Butts, PA-C 04/09/15 1813  Leo Grosser, MD 04/11/15 [phone]

## 2015-04-09 NOTE — ED Notes (Signed)
C/o elevated BP, light headed, HA and dizzy since Monday-out of BP meds x 1 week-pt with quick steady gait to triage

## 2015-04-09 NOTE — ED Notes (Signed)
Blood pressure set to take every 15 min.

## 2015-04-09 NOTE — ED Notes (Signed)
Patient transported to & from xray via stretcher.

## 2015-04-09 NOTE — Discharge Instructions (Signed)
1. Medications: usual home medications - refills provided today 2. Treatment: rest, drink plenty of fluids,  3. Follow Up: Please followup with your primary doctor in 1 week for discussion of your diagnoses and further evaluation after today's visit; if you do not have a primary care doctor use the resource guide provided to find one; Please return to the ER for worsening symptoms

## 2015-04-15 ENCOUNTER — Ambulatory Visit: Payer: Managed Care, Other (non HMO)

## 2015-04-16 ENCOUNTER — Emergency Department (HOSPITAL_COMMUNITY): Payer: Managed Care, Other (non HMO)

## 2015-04-16 ENCOUNTER — Other Ambulatory Visit: Payer: Self-pay

## 2015-04-16 ENCOUNTER — Encounter (HOSPITAL_COMMUNITY): Payer: Self-pay | Admitting: Emergency Medicine

## 2015-04-16 ENCOUNTER — Emergency Department (HOSPITAL_COMMUNITY)
Admission: EM | Admit: 2015-04-16 | Discharge: 2015-04-16 | Disposition: A | Discharge status: Home / Self Care | Payer: Managed Care, Other (non HMO) | Attending: Emergency Medicine | Admitting: Emergency Medicine

## 2015-04-16 DIAGNOSIS — I10 Essential (primary) hypertension: Secondary | ICD-10-CM | POA: Insufficient documentation

## 2015-04-16 DIAGNOSIS — Z9889 Other specified postprocedural states: Secondary | ICD-10-CM | POA: Insufficient documentation

## 2015-04-16 DIAGNOSIS — E119 Type 2 diabetes mellitus without complications: Secondary | ICD-10-CM | POA: Insufficient documentation

## 2015-04-16 DIAGNOSIS — Z87891 Personal history of nicotine dependence: Secondary | ICD-10-CM | POA: Insufficient documentation

## 2015-04-16 DIAGNOSIS — R0789 Other chest pain: Secondary | ICD-10-CM

## 2015-04-16 DIAGNOSIS — G40909 Epilepsy, unspecified, not intractable, without status epilepticus: Secondary | ICD-10-CM | POA: Insufficient documentation

## 2015-04-16 DIAGNOSIS — Z86011 Personal history of benign neoplasm of the brain: Secondary | ICD-10-CM | POA: Insufficient documentation

## 2015-04-16 DIAGNOSIS — Z79899 Other long term (current) drug therapy: Secondary | ICD-10-CM | POA: Insufficient documentation

## 2015-04-16 LAB — CBC
HCT: 39.2 % (ref 39.0–52.0)
Hemoglobin: 13.4 g/dL (ref 13.0–17.0)
MCH: 26.2 pg (ref 26.0–34.0)
MCHC: 34.2 g/dL (ref 30.0–36.0)
MCV: 76.6 fL — ABNORMAL LOW (ref 78.0–100.0)
PLATELETS: 319 10*3/uL (ref 150–400)
RBC: 5.12 MIL/uL (ref 4.22–5.81)
RDW: 14.2 % (ref 11.5–15.5)
WBC: 12 10*3/uL — ABNORMAL HIGH (ref 4.0–10.5)

## 2015-04-16 LAB — BASIC METABOLIC PANEL
Anion gap: 10 (ref 5–15)
BUN: 13 mg/dL (ref 6–20)
CALCIUM: 9.7 mg/dL (ref 8.9–10.3)
CO2: 29 mmol/L (ref 22–32)
CREATININE: 1.19 mg/dL (ref 0.61–1.24)
Chloride: 96 mmol/L — ABNORMAL LOW (ref 101–111)
GFR calc non Af Amer: >60 mL/min (ref >60)
Glucose, Bld: 135 mg/dL — ABNORMAL HIGH (ref 65–99)
Potassium: 3.8 mmol/L (ref 3.5–5.1)
SODIUM: 135 mmol/L (ref 135–145)

## 2015-04-16 LAB — I-STAT TROPONIN, ED
TROPONIN I, POC: 0 ng/mL (ref 0.00–0.08)
TROPONIN I, POC: 0.01 ng/mL (ref 0.00–0.08)

## 2015-04-16 LAB — BRAIN NATRIURETIC PEPTIDE: B Natriuretic Peptide: 15.9 pg/mL (ref 0.0–100.0)

## 2015-04-16 MED ORDER — GI COCKTAIL ~~LOC~~
30.0000 mL | Freq: Once | ORAL | Status: AC
  Administered 2015-04-16: 30 mL via ORAL
  Filled 2015-04-16: qty 30

## 2015-04-16 MED ORDER — HYDROCODONE-ACETAMINOPHEN 5-325 MG PO TABS
1.0000 | ORAL_TABLET | Freq: Once | ORAL | Status: AC
  Administered 2015-04-16: 1 via ORAL
  Filled 2015-04-16: qty 1

## 2015-04-16 MED ORDER — HYDROCODONE-ACETAMINOPHEN 5-325 MG PO TABS: ORAL_TABLET | ORAL | Status: DC

## 2015-04-16 NOTE — ED Provider Notes (Signed)
CSN: [id number]     Arrival date & time 04/16/15  1148 History   First MD Initiated Contact with Patient 04/16/15 1630     Chief Complaint  Patient presents with  . Chest Pain     (Consider location/radiation/quality/duration/timing/severity/associated sxs/prior Treatment) HPI Comments: Patient with h/o seizures, DM, HTN (poorly controlled), history of remote catheterization per patient -- presents with complaint of intermittent sharp left-sided chest pains starting 3 days ago, becoming more frequent yesterday. He describes this pain as 'a spasm'. Patient states that the pain is brought on with certain movements. It is not associated with radiation, vomiting, diaphoresis, shortness of breath. Pain is not exertional. He states that sometimes it is worse after he eats. No fevers, cough, palpitations, abdominal pain, diarrhea. No diagnosed history of high cholesterol. Patient does not smoke. The onset of this condition was acute. The course is intermittent. Aggravating factors: movement. Alleviating factors: none.    Patient is a 59 y.o. male presenting with chest pain. The history is provided by the patient and medical records.  Chest Pain Associated symptoms: no abdominal pain, no back pain, no cough, no diaphoresis, no fever, no nausea, no palpitations, no shortness of breath and not vomiting     Past Medical History  Diagnosis Date  . Sleep apnea   . Seizures ([place])   . Epilepsy ([place])   . Hypertension   . Diabetes mellitus without complication ([place])   . 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 No past surgeries    . Cardiac catheterization      pt's wife denies cardiac cath   Family History  Problem Relation Age of Onset  . Family history unknown: Yes   Social History  Substance Use Topics  . Smoking status: Former Research scientist (life sciences)  . Smokeless tobacco: Never Used  . Alcohol Use: No    Review of Systems  Constitutional: Negative for fever  and diaphoresis.  Eyes: Negative for redness.  Respiratory: Negative for cough and shortness of breath.   Cardiovascular: Positive for chest pain. Negative for palpitations and leg swelling.  Gastrointestinal: Negative for nausea, vomiting and abdominal pain.  Genitourinary: Negative for dysuria.  Musculoskeletal: Negative for back pain and neck pain.  Skin: Negative for rash.  Neurological: Negative for syncope and light-headedness.  Psychiatric/Behavioral: The patient is not nervous/anxious.       Allergies  Review of patient's allergies indicates no known allergies.  Home Medications   Prior to Admission medications   Medication Sig Start Date End Date Taking? Authorizing Provider  acetaminophen (TYLENOL) 325 MG tablet Take 650 mg by mouth every 6 (six) hours as needed for moderate pain.    Historical Provider, MD  albuterol (PROVENTIL HFA;VENTOLIN HFA) 108 (90 BASE) MCG/ACT inhaler Inhale 2 puffs into the lungs every 4 (four) hours as needed for wheezing or shortness of breath. 04/09/15   Hannah Muthersbaugh, PA-C  Alum Hydroxide-Mag Carbonate (HEARTBURN ANTACID EX ST) 160-105 MG CHEW Chew 1 tablet by mouth 3 (three) times daily as needed (for heartburn).    Historical Provider, MD  amLODipine-benazepril (LOTREL) 10-20 MG capsule Take 1 capsule by mouth daily. 04/09/15   Hannah Muthersbaugh, PA-C  furosemide (LASIX) 40 MG tablet Take 1 tablet (40 mg total) by mouth daily. 04/09/15   Hannah Muthersbaugh, PA-C  levETIRAcetam (KEPPRA) 1000 MG tablet Take 1 tablet (1,000 mg total) by mouth 2 (two) times daily. 04/09/15   Abigail Butts, PA-C  Menthol-Ascorbic  Acid (LUDENS COUGH DROPS MT) Use as directed 1 drop in the mouth or throat daily as needed (for sore throat).    Historical Provider, MD   BP 164/86 mmHg  Pulse 87  Temp(Src) 98.1 F (36.7 C) (Oral)  Resp 16  Ht 5\' 8"  (1.727 m)  Wt 147.873 kg  BMI 49.58 kg/m2  SpO2 94% Physical Exam  Constitutional: He appears  well-developed and well-nourished.  HENT:  Head: Normocephalic and atraumatic.  Mouth/Throat: Oropharynx is clear and moist and mucous membranes are normal. Mucous membranes are not dry.  Eyes: Conjunctivae are normal.  Neck: Trachea normal and normal range of motion. Neck supple. Normal carotid pulses and no JVD present. No muscular tenderness present. Carotid bruit is not present. No tracheal deviation present.  Cardiovascular: Normal rate, regular rhythm, S1 normal, S2 normal, normal heart sounds and intact distal pulses.  Exam reveals no distant heart sounds and no decreased pulses.   No murmur heard. Pulmonary/Chest: Effort normal and breath sounds normal. No respiratory distress. He has no wheezes. He exhibits tenderness (Under L nipple, patient withdrawals to palpation in this area, states this reproduces the pain that he is feeling).  Abdominal: Soft. Normal aorta and bowel sounds are normal. There is no tenderness. There is no rebound and no guarding.  Musculoskeletal: He exhibits no edema.  Neurological: He is alert.  Skin: Skin is warm and dry. He is not diaphoretic. No cyanosis. No pallor.  Psychiatric: He has a normal mood and affect.  Nursing note and vitals reviewed.   ED Course  Procedures (including critical care time) Labs Review Labs Reviewed  BASIC METABOLIC PANEL - Abnormal; Notable for the following:    Chloride 96 (*)    Glucose, Bld 135 (*)    All other components within normal limits  CBC - Abnormal; Notable for the following:    WBC 12.0 (*)    MCV 76.6 (*)    All other components within normal limits  BRAIN NATRIURETIC PEPTIDE  I-STAT TROPOININ, ED  Randolm Idol, ED    Imaging Review Dg Chest 2 View  04/16/2015  CLINICAL DATA:  Chest pain for 2 days EXAM: CHEST  2 VIEW COMPARISON:  04/09/2015 FINDINGS: Normal heart size and mediastinal contours. Bilateral extrapleural fat expansion at the bases. No acute infiltrate or edema. No effusion or  pneumothorax. No acute osseous findings. IMPRESSION: No active cardiopulmonary disease. Electronically Signed   By: Monte Fantasia M.D.   On: 04/16/2015 12:52   I have personally reviewed and evaluated these images and lab results as part of my medical decision-making.   EKG Interpretation None       4:32 PM Patient seen and examined. Work-up reviewed. EKG reviewed. Appears similar to 04/10/2015. On exam, pain is readily reproducible. Will check repeat EKG and troponin.   Vital signs reviewed and are as follows: BP 164/86 mmHg  Pulse 87  Temp(Src) 98.1 F (36.7 C) (Oral)  Resp 16  Ht 5\' 8"  (1.727 m)  Wt 147.873 kg  BMI 49.58 kg/m2  SpO2 94%  6:17 PM Patient discussed with Dr. Roxanne Mins who has seen patient.   Repeat EKG and troponin are reassuring and unchanged.  Will discharge with cardiology follow-up for consideration of stress testing, given risk factors. Patient is in the process of applying for an orange card to obtain PCP follow-up.  Patient was counseled to return with severe chest pain, especially if the pain is crushing or pressure-like and spreads to the arms, back, neck, or  jaw, or if they have sweating, nausea, or shortness of breath with the pain. They were encouraged to call 911 with these symptoms.   They were also told to return if their chest pain gets worse and does not go away with rest, they have an attack of chest pain lasting longer than usual despite rest and treatment with the medications their caregiver has prescribed, if they wake from sleep with chest pain or shortness of breath, if they feel dizzy or faint, if they have chest pain not typical of their usual pain, or if they have any other emergent concerns regarding their health.  The patient verbalized understanding and agreed.   Patient counseled on use of narcotic pain medications. Counseled not to combine these medications with others containing tylenol. Urged not to drink alcohol, drive, or perform any  other activities that requires focus while taking these medications. The patient verbalizes understanding and agrees with the plan.   MDM   Final diagnoses:  Chest wall pain   Patient with history of cardiac risk factors with reproducible chest wall pain left-sided, without radiation, diaphoresis, vomiting, or exertional factors. Cardiac workup in emergency department is reassuring. No significant EKG abnormality to suggest ischemia. Repeat EKG and troponin are unchanged. Return instructions and follow-up instructions as above. Discharge to home.    Carlisle Cater, PA-C [id number]  David Glick, MD [id number] [phone]

## 2015-04-16 NOTE — ED Notes (Signed)
Pt states he had been having intermittent pressure on the left side of his chest since Monday. Pt states 30 mins prior to arrival he had a worsening of chest pressure and felt like something was sitting on his chest. Pt denies any SOB, n/v. Pt is warm and dry. States pain is 3/10 at this time.

## 2015-04-16 NOTE — Discharge Instructions (Signed)
Please read and follow all provided instructions.  Your diagnoses today include:  1. Chest wall pain    Tests performed today include:  An EKG of your heart  A chest x-ray  Cardiac enzymes - a blood test for heart muscle damage  Blood counts and electrolytes  Vital signs. See below for your results today.   Medications prescribed:   Vicodin (hydrocodone/acetaminophen) - narcotic pain medication  DO NOT drive or perform any activities that require you to be awake and alert because this medicine can make you drowsy. BE VERY CAREFUL not to take multiple medicines containing Tylenol (also called acetaminophen). Doing so can lead to an overdose which can damage your liver and cause liver failure and possibly death.  Take any prescribed medications only as directed.  Follow-up instructions: Please follow-up with your primary care provider and the cardiologist listed as soon as you can for further evaluation of your symptoms and stress testing.   Return instructions:  SEEK IMMEDIATE MEDICAL ATTENTION IF:  You have severe chest pain, especially if the pain is crushing or pressure-like and spreads to the arms, back, neck, or jaw, or if you have sweating, nausea (feeling sick to your stomach), or shortness of breath. THIS IS AN EMERGENCY. Don't wait to see if the pain will go away. Get medical help at once. Call 911 or 0 (operator). DO NOT drive yourself to the hospital.   Your chest pain gets worse and does not go away with rest.   You have an attack of chest pain lasting longer than usual, despite rest and treatment with the medications your caregiver has prescribed.   You wake from sleep with chest pain or shortness of breath.  You feel dizzy or faint.  You have chest pain not typical of your usual pain for which you originally saw your caregiver.   You have any other emergent concerns regarding your health.  Additional Information: Chest pain comes from many different causes.  Your caregiver has diagnosed you as having chest pain that is not specific for one problem, but does not require admission.  You are at low risk for an acute heart condition or other serious illness.   Your vital signs today were: BP 129/58 mmHg   Pulse 84   Temp(Src) 98.1 F (36.7 C) (Oral)   Resp 20   Ht 5\' 8"  (1.727 m)   Wt 147.873 kg   BMI 49.58 kg/m2   SpO2 94% If your blood pressure (BP) was elevated above 135/85 this visit, please have this repeated by your doctor within one month. --------------

## 2015-05-14 MED FILL — AMLODIPINE-BENAZEPRIL 10-20: 10-20 | 30 days supply | Qty: 30 | Fill #1

## 2015-05-14 MED FILL — FUROSEMIDE 40 MG TABLET: 40 | 30 days supply | Qty: 30 | Fill #1

## 2015-06-14 ENCOUNTER — Emergency Department (HOSPITAL_COMMUNITY): Payer: Managed Care, Other (non HMO)

## 2015-06-14 ENCOUNTER — Encounter (HOSPITAL_COMMUNITY): Payer: Self-pay | Admitting: Emergency Medicine

## 2015-06-14 ENCOUNTER — Emergency Department (HOSPITAL_COMMUNITY)
Admission: EM | Admit: 2015-06-14 | Discharge: 2015-06-14 | Disposition: A | Discharge status: Home / Self Care | Payer: Self-pay | Attending: Emergency Medicine | Admitting: Emergency Medicine

## 2015-06-14 DIAGNOSIS — Z87891 Personal history of nicotine dependence: Secondary | ICD-10-CM | POA: Insufficient documentation

## 2015-06-14 DIAGNOSIS — I10 Essential (primary) hypertension: Secondary | ICD-10-CM | POA: Insufficient documentation

## 2015-06-14 DIAGNOSIS — E1165 Type 2 diabetes mellitus with hyperglycemia: Secondary | ICD-10-CM

## 2015-06-14 DIAGNOSIS — G40909 Epilepsy, unspecified, not intractable, without status epilepticus: Secondary | ICD-10-CM | POA: Insufficient documentation

## 2015-06-14 DIAGNOSIS — H538 Other visual disturbances: Secondary | ICD-10-CM | POA: Insufficient documentation

## 2015-06-14 DIAGNOSIS — Z79899 Other long term (current) drug therapy: Secondary | ICD-10-CM | POA: Insufficient documentation

## 2015-06-14 DIAGNOSIS — E118 Type 2 diabetes mellitus with unspecified complications: Secondary | ICD-10-CM | POA: Insufficient documentation

## 2015-06-14 DIAGNOSIS — E119 Type 2 diabetes mellitus without complications: Secondary | ICD-10-CM | POA: Insufficient documentation

## 2015-06-14 DIAGNOSIS — R12 Heartburn: Secondary | ICD-10-CM | POA: Insufficient documentation

## 2015-06-14 DIAGNOSIS — IMO0002 Reserved for concepts with insufficient information to code with codable children: Secondary | ICD-10-CM

## 2015-06-14 LAB — CBC WITH DIFFERENTIAL/PLATELET
BASOS ABS: 0.1 10*3/uL (ref 0.0–0.1)
Basophils Relative: 1 %
Eosinophils Absolute: 0.3 10*3/uL (ref 0.0–0.7)
Eosinophils Relative: 3 %
HCT: 39.6 % (ref 39.0–52.0)
Hemoglobin: 14.3 g/dL (ref 13.0–17.0)
LYMPHS ABS: 1.7 10*3/uL (ref 0.7–4.0)
LYMPHS PCT: 17 %
MCH: 27.2 pg (ref 26.0–34.0)
MCHC: 36.1 g/dL — ABNORMAL HIGH (ref 30.0–36.0)
MCV: 75.3 fL — AB (ref 78.0–100.0)
MONOS PCT: 7 %
Monocytes Absolute: 0.7 10*3/uL (ref 0.1–1.0)
NEUTROS ABS: 7.1 10*3/uL (ref 1.7–7.7)
NEUTROS PCT: 72 %
PLATELETS: 315 10*3/uL (ref 150–400)
RBC: 5.26 MIL/uL (ref 4.22–5.81)
RDW: 14 % (ref 11.5–15.5)
WBC: 9.9 10*3/uL (ref 4.0–10.5)

## 2015-06-14 LAB — I-STAT TROPONIN, ED: TROPONIN I, POC: 0.01 ng/mL (ref 0.00–0.08)

## 2015-06-14 LAB — COMPREHENSIVE METABOLIC PANEL
ALT: 29 U/L (ref 17–63)
AST: 22 U/L (ref 15–41)
Albumin: 3.5 g/dL (ref 3.5–5.0)
Alkaline Phosphatase: 100 U/L (ref 38–126)
Anion gap: 11 (ref 5–15)
BUN: 12 mg/dL (ref 6–20)
CHLORIDE: 96 mmol/L — AB (ref 101–111)
CO2: 26 mmol/L (ref 22–32)
Calcium: 9.2 mg/dL (ref 8.9–10.3)
Creatinine, Ser: 1.2 mg/dL (ref 0.61–1.24)
Glucose, Bld: 503 mg/dL — ABNORMAL HIGH (ref 65–99)
POTASSIUM: 4.3 mmol/L (ref 3.5–5.1)
SODIUM: 133 mmol/L — AB (ref 135–145)
Total Bilirubin: 0.5 mg/dL (ref 0.3–1.2)
Total Protein: 7.6 g/dL (ref 6.5–8.1)

## 2015-06-14 LAB — CBG MONITORING, ED: GLUCOSE-CAPILLARY: 286 mg/dL — AB (ref 65–99)

## 2015-06-14 LAB — LIPASE, BLOOD: LIPASE: 23 U/L (ref 11–51)

## 2015-06-14 LAB — TROPONIN I

## 2015-06-14 MED ORDER — LEVETIRACETAM 1000 MG PO TABS: 1000.0000 mg | ORAL_TABLET | Freq: Two times a day (BID) | ORAL | Status: DC

## 2015-06-14 MED ORDER — SODIUM CHLORIDE 0.9 % IV BOLUS (SEPSIS)
2000.0000 mL | Freq: Once | INTRAVENOUS | Status: AC
  Administered 2015-06-14: 2000 mL via INTRAVENOUS

## 2015-06-14 MED ORDER — METFORMIN HCL 500 MG PO TABS: 500.0000 mg | ORAL_TABLET | Freq: Two times a day (BID) | ORAL | Status: DC

## 2015-06-14 MED ORDER — FUROSEMIDE 40 MG PO TABS: 40.0000 mg | ORAL_TABLET | Freq: Every day | ORAL | Status: DC

## 2015-06-14 MED ORDER — AMLODIPINE BESY-BENAZEPRIL HCL 10-20 MG PO CAPS: 1.0000 | ORAL_CAPSULE | Freq: Every day | ORAL | Status: DC

## 2015-06-14 NOTE — ED Notes (Signed)
Pt c/o blurred vision to left eye onset 3-4 days ago. 2 days ago pt began to experience left sided chest pain that radiates to center chest. Today pt feels he had a "mild seizure." Reports that he was sitting in the room and lost 10 mins of time.

## 2015-06-14 NOTE — ED Notes (Signed)
Patient transported to X-ray 

## 2015-06-14 NOTE — ED Provider Notes (Signed)
CSN: [id number]     Arrival date & time 06/14/15  [id number] History   None    Chief Complaint  Patient presents with  . Chest Pain  . Blurred Vision  . Seizures     (Consider location/radiation/quality/duration/timing/severity/associated sxs/prior Treatment) Patient is a 60 y.o. male presenting with chest pain and seizures.  Chest Pain Pain location:  Substernal area Pain quality: sharp   Pain radiates to:  Does not radiate Pain radiates to the back: no   Pain severity:  Mild Progression:  Partially resolved Chronicity:  Recurrent Context: eating   Relieved by:  None tried Worsened by:  Nothing tried Ineffective treatments:  None tried Associated symptoms: heartburn   Associated symptoms: no abdominal pain, no back pain, no cough and no fever   Seizures   Past Medical History  Diagnosis Date  . Sleep apnea   . Seizures ([place])   . Epilepsy ([place])   . Hypertension   . Diabetes mellitus without complication ([place])   . 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 No past surgeries    . Cardiac catheterization      pt's wife denies cardiac cath   Family History  Problem Relation Age of Onset  . Family history unknown: Yes   Social History  Substance Use Topics  . Smoking status: Former Research scientist (life sciences)  . Smokeless tobacco: Never Used  . Alcohol Use: No    Review of Systems  Constitutional: Negative for fever and chills.  Eyes: Positive for visual disturbance (left blurry). Negative for pain.  Respiratory: Negative for cough.   Cardiovascular: Positive for chest pain.  Gastrointestinal: Positive for heartburn. Negative for abdominal pain.  Endocrine: Negative for polydipsia and polyuria.  Musculoskeletal: Negative for back pain.  Neurological: Positive for seizures.  All other systems reviewed and are negative.     Allergies  Review of patient's allergies indicates no known allergies.  Home Medications   Prior to Admission  medications   Medication Sig Start Date End Date Taking? Authorizing Provider  acetaminophen (TYLENOL) 325 MG tablet Take 650 mg by mouth every 6 (six) hours as needed for moderate pain.   Yes Historical Provider, MD  albuterol (PROVENTIL HFA;VENTOLIN HFA) 108 (90 BASE) MCG/ACT inhaler Inhale 2 puffs into the lungs every 4 (four) hours as needed for wheezing or shortness of breath. 04/09/15  Yes Hannah Muthersbaugh, PA-C  Alum Hydroxide-Mag Carbonate (HEARTBURN ANTACID EX ST) 160-105 MG CHEW Chew 1 tablet by mouth 3 (three) times daily as needed (for heartburn).   Yes Historical Provider, MD  HYDROcodone-acetaminophen (NORCO/VICODIN) 5-325 MG tablet Take 1-2 tablets every 6 hours as needed for severe pain 04/16/15  Yes Carlisle Cater, PA-C  amLODipine-benazepril (LOTREL) 10-20 MG capsule Take 1 capsule by mouth daily. 06/14/15   Merrily Pew, MD  furosemide (LASIX) 40 MG tablet Take 1 tablet (40 mg total) by mouth daily. 06/14/15   Merrily Pew, MD  levETIRAcetam (KEPPRA) 1000 MG tablet Take 1 tablet (1,000 mg total) by mouth 2 (two) times daily. 06/14/15   Merrily Pew, MD  metFORMIN (GLUCOPHAGE) 500 MG tablet Take 1 tablet (500 mg total) by mouth 2 (two) times daily with a meal. 06/14/15   Merrily Pew, MD   BP 162/91 mmHg  Pulse 87  Temp(Src) 98.1 F (36.7 C)  Resp 18  Ht 5\' 8"  (1.727 m)  Wt 325 lb (147.419 kg)  BMI 49.43 kg/m2  SpO2 95% Physical Exam  Constitutional: He is oriented to person, place, and time. He appears well-developed and well-nourished.  HENT:  Head: Normocephalic and atraumatic.  Neck: Normal range of motion.  Cardiovascular: Normal rate and regular rhythm.  Exam reveals no gallop and no friction rub.   No murmur heard. Pulmonary/Chest: Effort normal. No respiratory distress. He has no wheezes. He has no rales.  Abdominal: He exhibits no distension.  Musculoskeletal: Normal range of motion. He exhibits no edema or tenderness.  Neurological: He is alert and oriented to  person, place, and time. No cranial nerve deficit.  No altered mental status, able to give full seemingly accurate history.  Face is symmetric, EOM's intact, pupils equal and reactive, vision intact, tongue and uvula midline without deviation Upper and Lower extremity motor 5/5, intact pain perception in distal extremities, 2+ reflexes in biceps, patella and achilles tendons. Finger to nose normal, heel to shin normal.  Nursing note and vitals reviewed.   ED Course  Procedures (including critical care time) Labs Review Labs Reviewed  CBC WITH DIFFERENTIAL/PLATELET - Abnormal; Notable for the following:    MCV 75.3 (*)    MCHC 36.1 (*)    All other components within normal limits  COMPREHENSIVE METABOLIC PANEL - Abnormal; Notable for the following:    Sodium 133 (*)    Chloride 96 (*)    Glucose, Bld 503 (*)    All other components within normal limits  CBG MONITORING, ED - Abnormal; Notable for the following:    Glucose-Capillary 286 (*)    All other components within normal limits  LIPASE, BLOOD  TROPONIN I  I-STAT TROPOININ, ED    Imaging Review Dg Chest 2 View  06/14/2015  CLINICAL DATA:  Seizures, headache, left-sided chest pain EXAM: CHEST  2 VIEW COMPARISON:  04/16/2015 FINDINGS: There is mild bilateral interstitial thickening unchanged compared with multiple prior exams. There is no focal parenchymal opacity. There is no pleural effusion or pneumothorax. The heart and mediastinal contours are unremarkable. The osseous structures are unremarkable. IMPRESSION: No active cardiopulmonary disease. Electronically Signed   By: Kathreen Devoid   On: 06/14/2015 11:02   I have personally reviewed and evaluated these images and lab results as part of my medical decision-making.   EKG Interpretation   Date/Time:  Saturday June 14 2015 09:58:09 EST Ventricular Rate:  99 PR Interval:  162 QRS Duration: 82 QT Interval:  362 QTC Calculation: 464 R Axis:   82 Text Interpretation:   Normal sinus rhythm Cannot rule out Anterior infarct  , age undetermined Abnormal ECG No significant change since last tracing  Confirmed by [facility] MD, Corene Cornea [phone]) on 06/14/2015 11:11:20 AM      MDM   Final diagnoses:  Uncontrolled type 2 diabetes mellitus with complication, without long-term current use of insulin (HCC)    Chest pain atypical and not present currently, doubt ACS, PE, PNA, PTX. Likely GI related, patient will cotninue to follow.  Blurry vision present here but no vision changes, improved with improved blood sugar, quite possibly related. Exam without evidence for stroke, retinal detachment, glaucoma, vitreous hemorrhage or other emergent causes and once again, improved prior to dc.  Had not been taking metformin appropriately so will strt that bid. Also refilled other chronic meds. He is already working on getting PCP follow up.     Merrily Pew, MD 06/14/15 [phone]

## 2015-08-29 ENCOUNTER — Encounter (HOSPITAL_COMMUNITY): Payer: Self-pay

## 2015-08-29 ENCOUNTER — Emergency Department (HOSPITAL_COMMUNITY): Payer: Self-pay

## 2015-08-29 ENCOUNTER — Emergency Department (HOSPITAL_COMMUNITY)
Admission: EM | Admit: 2015-08-29 | Discharge: 2015-08-30 | Disposition: A | Discharge status: Home / Self Care | Payer: Self-pay | Attending: Emergency Medicine | Admitting: Emergency Medicine

## 2015-08-29 DIAGNOSIS — R05 Cough: Secondary | ICD-10-CM | POA: Insufficient documentation

## 2015-08-29 DIAGNOSIS — Z79899 Other long term (current) drug therapy: Secondary | ICD-10-CM | POA: Insufficient documentation

## 2015-08-29 DIAGNOSIS — E1165 Type 2 diabetes mellitus with hyperglycemia: Secondary | ICD-10-CM | POA: Insufficient documentation

## 2015-08-29 DIAGNOSIS — Z87891 Personal history of nicotine dependence: Secondary | ICD-10-CM | POA: Insufficient documentation

## 2015-08-29 DIAGNOSIS — Z86011 Personal history of benign neoplasm of the brain: Secondary | ICD-10-CM | POA: Insufficient documentation

## 2015-08-29 DIAGNOSIS — I10 Essential (primary) hypertension: Secondary | ICD-10-CM | POA: Insufficient documentation

## 2015-08-29 DIAGNOSIS — G40909 Epilepsy, unspecified, not intractable, without status epilepticus: Secondary | ICD-10-CM | POA: Insufficient documentation

## 2015-08-29 DIAGNOSIS — R631 Polydipsia: Secondary | ICD-10-CM | POA: Insufficient documentation

## 2015-08-29 DIAGNOSIS — R358 Other polyuria: Secondary | ICD-10-CM | POA: Insufficient documentation

## 2015-08-29 DIAGNOSIS — Z7984 Long term (current) use of oral hypoglycemic drugs: Secondary | ICD-10-CM | POA: Insufficient documentation

## 2015-08-29 LAB — CBC
HEMATOCRIT: 40 % (ref 39.0–52.0)
Hemoglobin: 13.8 g/dL (ref 13.0–17.0)
MCH: 25.9 pg — AB (ref 26.0–34.0)
MCHC: 34.5 g/dL (ref 30.0–36.0)
MCV: 75.2 fL — AB (ref 78.0–100.0)
Platelets: 294 10*3/uL (ref 150–400)
RBC: 5.32 MIL/uL (ref 4.22–5.81)
RDW: 14.7 % (ref 11.5–15.5)
WBC: 11.9 10*3/uL — ABNORMAL HIGH (ref 4.0–10.5)

## 2015-08-29 LAB — COMPREHENSIVE METABOLIC PANEL
ALT: 42 U/L (ref 17–63)
AST: 27 U/L (ref 15–41)
Albumin: 3.6 g/dL (ref 3.5–5.0)
Alkaline Phosphatase: 101 U/L (ref 38–126)
Anion gap: 12 (ref 5–15)
BUN: 11 mg/dL (ref 6–20)
CHLORIDE: 98 mmol/L — AB (ref 101–111)
CO2: 25 mmol/L (ref 22–32)
CREATININE: 1.06 mg/dL (ref 0.61–1.24)
Calcium: 9.5 mg/dL (ref 8.9–10.3)
Glucose, Bld: 349 mg/dL — ABNORMAL HIGH (ref 65–99)
POTASSIUM: 4 mmol/L (ref 3.5–5.1)
Sodium: 135 mmol/L (ref 135–145)
Total Bilirubin: 0.7 mg/dL (ref 0.3–1.2)
Total Protein: 7.5 g/dL (ref 6.5–8.1)

## 2015-08-29 LAB — I-STAT CHEM 8, ED
BUN: 12 mg/dL (ref 6–20)
CALCIUM ION: 1.15 mmol/L (ref 1.12–1.23)
Chloride: 97 mmol/L — ABNORMAL LOW (ref 101–111)
Creatinine, Ser: 1 mg/dL (ref 0.61–1.24)
GLUCOSE: 340 mg/dL — AB (ref 65–99)
HCT: 46 % (ref 39.0–52.0)
HEMOGLOBIN: 15.6 g/dL (ref 13.0–17.0)
Potassium: 4 mmol/L (ref 3.5–5.1)
SODIUM: 136 mmol/L (ref 135–145)
TCO2: 27 mmol/L (ref 0–100)

## 2015-08-29 LAB — APTT: aPTT: 28 seconds (ref 24–37)

## 2015-08-29 LAB — DIFFERENTIAL
BASOS PCT: 1 %
Basophils Absolute: 0.1 10*3/uL (ref 0.0–0.1)
EOS ABS: 0.3 10*3/uL (ref 0.0–0.7)
Eosinophils Relative: 2 %
Lymphocytes Relative: 20 %
Lymphs Abs: 2.4 10*3/uL (ref 0.7–4.0)
MONO ABS: 0.8 10*3/uL (ref 0.1–1.0)
MONOS PCT: 7 %
Neutro Abs: 8.3 10*3/uL — ABNORMAL HIGH (ref 1.7–7.7)
Neutrophils Relative %: 70 %

## 2015-08-29 LAB — I-STAT TROPONIN, ED: TROPONIN I, POC: 0.01 ng/mL (ref 0.00–0.08)

## 2015-08-29 LAB — PROTIME-INR
INR: 0.98 (ref 0.00–1.49)
Prothrombin Time: 13.2 seconds (ref 11.6–15.2)

## 2015-08-29 NOTE — ED Notes (Signed)
Pt. Reports having blurred vision and a frontal  headache since 730 am.  Pt. Ran out of his BP meds 2 days ago.  He has a hx of seizure, brain tumor , epilepsy.  Speech is clear. Gait steady.  Pt. Denies any chest pain or sob, n/v  No weakness or numbness in his extremeiteis. Pt. Is alert and oriented X3. Skin is warm and dry.

## 2015-08-30 LAB — CBG MONITORING, ED: Glucose-Capillary: 254 mg/dL — ABNORMAL HIGH (ref 65–99)

## 2015-08-30 MED ORDER — METFORMIN HCL 500 MG PO TABS: 500.0000 mg | ORAL_TABLET | Freq: Two times a day (BID) | ORAL | Status: DC

## 2015-08-30 MED ORDER — AMLODIPINE BESY-BENAZEPRIL HCL 10-20 MG PO CAPS: 1.0000 | ORAL_CAPSULE | Freq: Every day | ORAL | Status: DC

## 2015-08-30 MED ORDER — SODIUM CHLORIDE 0.9 % IV BOLUS (SEPSIS)
1000.0000 mL | Freq: Once | INTRAVENOUS | Status: AC
  Administered 2015-08-30: 1000 mL via INTRAVENOUS

## 2015-08-30 MED ORDER — INSULIN ASPART 100 UNIT/ML ~~LOC~~ SOLN
8.0000 [IU] | Freq: Once | SUBCUTANEOUS | Status: AC
  Administered 2015-08-30: 8 [IU] via INTRAVENOUS
  Filled 2015-08-30: qty 1

## 2015-08-30 NOTE — ED Notes (Signed)
CBG 254

## 2015-08-30 NOTE — ED Provider Notes (Signed)
CSN: [id number]     Arrival date & time 08/29/15  1751 History   First MD Initiated Contact with Patient 08/29/15 2340     Chief Complaint  Patient presents with  . Blurred Vision     (Consider location/radiation/quality/duration/timing/severity/associated sxs/prior Treatment) The history is provided by the patient. No language interpreter was used.   This is a 60 year old male with history of Seizures, HTN, stable meningioma, and DM2, who presents with blurred vision and headache onset the day of arrival. He noted blurry vision upon waking up this AM with associated frontal headache. He gets blurry vision and headaches occasionally and attributes this to high blood pressure or high blood sugars. He denies photophobia, diplopia, eye pain, unilateral numbness or weakness, speech changes, LOC, vomiting, or diarrhea. He has no PCP, no current resources to receive his prescriptions. He ran out of his Lotrel 2 days ago but has been taking his metformin. Since this morning his blurry vision has improved however his headache persists.  Of note he has been taking a decreased dose of his Keppra (1000mg ) for the past month but has not had any seizures since approximately 2 months ago.  Past Medical History  Diagnosis Date  . Sleep apnea   . Seizures ([place])   . Epilepsy ([place])   . Hypertension   . Diabetes mellitus without complication ([place])   . Brain tumor ([place])     frontal   Past Surgical History  Procedure Laterality Date  . No past surgeries    . Cardiac catheterization      pt's wife denies cardiac cath   Family History  Problem Relation Age of Onset  . Family history unknown: Yes   Social History  Substance Use Topics  . Smoking status: Former Research scientist (life sciences)  . Smokeless tobacco: Never Used  . Alcohol Use: No    Review of Systems  Constitutional: Negative for fever and chills.  HENT: Negative for congestion, rhinorrhea, sinus pressure and sore throat.   Eyes: Positive for visual  disturbance (blurry vision). Negative for photophobia and pain.  Respiratory: Positive for cough (yellow/green sputum). Negative for chest tightness and shortness of breath.   Cardiovascular: Positive for leg swelling (at baseline). Negative for chest pain.  Gastrointestinal: Positive for abdominal pain (generalized, episodic). Negative for vomiting, diarrhea, constipation and blood in stool.  Endocrine: Positive for polydipsia and polyuria.  Genitourinary: Negative for dysuria and hematuria.  Neurological: Positive for seizures (last seizure 2 months ago) and headaches.      Allergies  Review of patient's allergies indicates no known allergies.  Home Medications   Prior to Admission medications   Medication Sig Start Date End Date Taking? Authorizing Provider  acetaminophen (TYLENOL) 325 MG tablet Take 650 mg by mouth every 6 (six) hours as needed for moderate pain.    Historical Provider, MD  albuterol (PROVENTIL HFA;VENTOLIN HFA) 108 (90 BASE) MCG/ACT inhaler Inhale 2 puffs into the lungs every 4 (four) hours as needed for wheezing or shortness of breath. 04/09/15   Hannah Muthersbaugh, PA-C  Alum Hydroxide-Mag Carbonate (HEARTBURN ANTACID EX ST) 160-105 MG CHEW Chew 1 tablet by mouth 3 (three) times daily as needed (for heartburn).    Historical Provider, MD  amLODipine-benazepril (LOTREL) 10-20 MG capsule Take 1 capsule by mouth daily. 06/14/15   Merrily Pew, MD  furosemide (LASIX) 40 MG tablet Take 1 tablet (40 mg total) by mouth daily. 06/14/15   Merrily Pew, MD  HYDROcodone-acetaminophen (NORCO/VICODIN) 5-325 MG tablet Take 1-2 tablets  every 6 hours as needed for severe pain 04/16/15   Carlisle Cater, PA-C  levETIRAcetam (KEPPRA) 1000 MG tablet Take 1 tablet (1,000 mg total) by mouth 2 (two) times daily. 06/14/15   Merrily Pew, MD  metFORMIN (GLUCOPHAGE) 500 MG tablet Take 1 tablet (500 mg total) by mouth 2 (two) times daily with a meal. 06/14/15   Merrily Pew, MD   BP 154/84 mmHg   Pulse 80  Temp(Src) 98.8 F (37.1 C) (Oral)  Resp 18  Ht 5\' 8"  (1.727 m)  Wt 149.687 kg  BMI 50.19 kg/m2  SpO2 97% Physical Exam  Constitutional: He is oriented to person, place, and time. He appears well-developed and well-nourished. No distress.  HENT:  Head: Normocephalic and atraumatic.  Eyes: Pupils are equal, round, and reactive to light. No scleral icterus.  Peripheral visual field testing without deficit.  Neck: Normal range of motion.  Cardiovascular: Normal rate and regular rhythm.   No murmur heard. Pulmonary/Chest: Effort normal. He has no wheezes. He has no rales.  Abdominal: Bowel sounds are normal. There is no rebound and no guarding.  Obese, protuberant  Musculoskeletal: Normal range of motion.  Neurological: He is alert and oriented to person, place, and time. Coordination normal.  Speech clear and focused. CN's 3-12 grossly intact.    Skin: Skin is warm and dry. He is not diaphoretic.  Psychiatric: He has a normal mood and affect.    ED Course  Procedures (including critical care time) Labs Review Labs Reviewed  CBC - Abnormal; Notable for the following:    WBC 11.9 (*)    MCV 75.2 (*)    MCH 25.9 (*)    All other components within normal limits  DIFFERENTIAL - Abnormal; Notable for the following:    Neutro Abs 8.3 (*)    All other components within normal limits  COMPREHENSIVE METABOLIC PANEL - Abnormal; Notable for the following:    Chloride 98 (*)    Glucose, Bld 349 (*)    All other components within normal limits  I-STAT CHEM 8, ED - Abnormal; Notable for the following:    Chloride 97 (*)    Glucose, Bld 340 (*)    All other components within normal limits  PROTIME-INR  APTT  I-STAT TROPOININ, ED  CBG MONITORING, ED    Imaging Review Ct Head Wo Contrast  08/29/2015  CLINICAL DATA:  Headache.  Dizziness.  Symptoms for 1 day. EXAM: CT HEAD WITHOUT CONTRAST TECHNIQUE: Contiguous axial images were obtained from the base of the skull through  the vertex without intravenous contrast. COMPARISON:  Head CT 04/09/2015.  Brain MRI 07/24/2014 FINDINGS: Left frontal meningioma with adjacent edema in the frontal cortex, unchanged in appearance from prior exams. No intracranial hemorrhage, midline shift or acute ischemia. Mild generalized atrophy, stable from prior exam. No hydrocephalus. Atherosclerosis of skullbase vasculature again seen. Mucous retention cyst in the right maxillary sinus with trace left maxillary mucosal thickening. Mastoid air cells are well aerated. IMPRESSION: 1. No acute intracranial abnormality. Unchanged appearance of left frontal meningioma with adjacent cerebral edema. 2. Trace left mucosal thickening of the maxillary sinus may be new. Electronically Signed   By: Jeb Levering M.D.   On: 08/29/2015 19:48   I have personally reviewed and evaluated these images and lab results as part of my medical decision-making.   EKG Interpretation None      MDM   Final diagnoses:  None    1. Hyperglycemia 2. Hypertension   1:30 - re-eval:  the patient feeling better. The plan is to give fluids, small dose insulin and re-eval in one hour with repeat CBG.  3:30 - Patient continues to feel asymptomatic with respect to blurred vision or weakness. He is ambulatory without normal gait and balance. VSS, CBG decreasing. Will refill medications (Lotrel and Metformin) and provide PCP referrals to [facility] and [facility], PA-C 08/30/15 [place], MD 08/31/15 [phone]

## 2015-08-30 NOTE — ED Notes (Signed)
Report to Bonnie, RN.

## 2015-08-30 NOTE — Discharge Instructions (Signed)
Blood Glucose Monitoring, Adult °Monitoring your blood glucose (also know as blood sugar) helps you to manage your diabetes. It also helps you and your health care provider monitor your diabetes and determine how well your treatment plan is working. °WHY SHOULD YOU MONITOR YOUR BLOOD GLUCOSE? °· It can help you understand how food, exercise, and medicine affect your blood glucose. °· It allows you to know what your blood glucose is at any given moment. You can quickly tell if you are having low blood glucose (hypoglycemia) or high blood glucose (hyperglycemia). °· It can help you and your health care provider know how to adjust your medicines. °· It can help you understand how to manage an illness or adjust medicine for exercise. °WHEN SHOULD YOU TEST? °Your health care provider will help you decide how often you should check your blood glucose. This may depend on the type of diabetes you have, your diabetes control, or the types of medicines you are taking. Be sure to write down all of your blood glucose readings so that this information can be reviewed with your health care provider. See below for examples of testing times that your health care provider may suggest. °Type 1 Diabetes °· Test at least 2 times per day if your diabetes is well controlled, if you are using an insulin pump, or if you perform multiple daily injections. °· If your diabetes is not well controlled or if you are sick, you may need to test more often. °· It is a good idea to also test: °¨ Before every insulin injection. °¨ Before and after exercise. °¨ Between meals and 2 hours after a meal. °¨ Occasionally between 2:00 a.m. and 3:00 a.m. °Type 2 Diabetes °· If you are taking insulin, test at least 2 times per day. However, it is best to test before every insulin injection. °· If you take medicines by mouth (orally), test 2 times a day. °· If you are on a controlled diet, test once a day. °· If your diabetes is not well controlled or if you  are sick, you may need to monitor more often. °HOW TO MONITOR YOUR BLOOD GLUCOSE °Supplies Needed °· Blood glucose meter. °· Test strips for your meter. Each meter has its own strips. You must use the strips that go with your own meter. °· A pricking needle (lancet). °· A device that holds the lancet (lancing device). °· A journal or log book to write down your results. °Procedure °· Wash your hands with soap and water. Alcohol is not preferred. °· Prick the side of your finger (not the tip) with the lancet. °· Gently milk the finger until a small drop of blood appears. °· Follow the instructions that come with your meter for inserting the test strip, applying blood to the strip, and using your blood glucose meter. °Other Areas to Get Blood for Testing °Some meters allow you to use other areas of your body (other than your finger) to test your blood. These areas are called alternative sites. The most common alternative sites are: °· The forearm. °· The thigh. °· The back area of the lower leg. °· The palm of the hand. °The blood flow in these areas is slower. Therefore, the blood glucose values you get may be delayed, and the numbers are different from what you would get from your fingers. Do not use alternative sites if you think you are having hypoglycemia. Your reading will not be accurate. Always use a finger if you are   having hypoglycemia. Also, if you cannot feel your lows (hypoglycemia unawareness), always use your fingers for your blood glucose checks. ADDITIONAL TIPS FOR GLUCOSE MONITORING  Do not reuse lancets.  Always carry your supplies with you.  All blood glucose meters have a 24-hour "hotline" number to call if you have questions or need help.  Adjust (calibrate) your blood glucose meter with a control solution after finishing a few boxes of strips. BLOOD GLUCOSE RECORD KEEPING It is a good idea to keep a daily record or log of your blood glucose readings. Most glucose meters, if not all,  keep your glucose records stored in the meter. Some meters come with the ability to download your records to your home computer. Keeping a record of your blood glucose readings is especially helpful if you are wanting to look for patterns. Make notes to go along with the blood glucose readings because you might forget what happened at that exact time. Keeping good records helps you and your health care provider to work together to achieve good diabetes management.    This information is not intended to replace advice given to you by your health care provider. Make sure you discuss any questions you have with your health care provider.   Document Released: 04/29/2003 Document Revised: 05/17/2014 Document Reviewed: 09/18/2012 Elsevier Interactive Patient Education 2016 [employer].  Diabetes and Exercise Exercising regularly is important. It is not just about losing weight. It has many health benefits, such as:  Improving your overall fitness, flexibility, and endurance.  Increasing your bone density.  Helping with weight control.  Decreasing your body fat.  Increasing your muscle strength.  Reducing stress and tension.  Improving your overall health. People with diabetes who exercise gain additional benefits because exercise:  Reduces appetite.  Improves the body's use of blood sugar (glucose).  Helps lower or control blood glucose.  Decreases blood pressure.  Helps control blood lipids (such as cholesterol and triglycerides).  Improves the body's use of the hormone insulin by:  Increasing the body's insulin sensitivity.  Reducing the body's insulin needs.  Decreases the risk for heart disease because exercising:  Lowers cholesterol and triglycerides levels.  Increases the levels of good cholesterol (such as high-density lipoproteins [HDL]) in the body.  Lowers blood glucose levels. YOUR ACTIVITY PLAN  Choose an activity that you enjoy, and set realistic goals. To  exercise safely, you should begin practicing any new physical activity slowly, and gradually increase the intensity of the exercise over time. Your health care provider or diabetes educator can help create an activity plan that works for you. General recommendations include:  Encouraging children to engage in at least 60 minutes of physical activity each day.  Stretching and performing strength training exercises, such as yoga or weight lifting, at least 2 times per week.  Performing a total of at least 150 minutes of moderate-intensity exercise each week, such as brisk walking or water aerobics.  Exercising at least 3 days per week, making sure you allow no more than 2 consecutive days to pass without exercising.  Avoiding long periods of inactivity (90 minutes or more). When you have to spend an extended period of time sitting down, take frequent breaks to walk or stretch. RECOMMENDATIONS FOR EXERCISING WITH TYPE 1 OR TYPE 2 DIABETES   Check your blood glucose before exercising. If blood glucose levels are greater than 240 mg/dL, check for urine ketones. Do not exercise if ketones are present.  Avoid injecting insulin into areas of the  body that are going to be exercised. For example, avoid injecting insulin into:  The arms when playing tennis.  The legs when jogging.  Keep a record of:  Food intake before and after you exercise.  Expected peak times of insulin action.  Blood glucose levels before and after you exercise.  The type and amount of exercise you have done.  Review your records with your health care provider. Your health care provider will help you to develop guidelines for adjusting food intake and insulin amounts before and after exercising.  If you take insulin or oral hypoglycemic agents, watch for signs and symptoms of hypoglycemia. They include:  Dizziness.  Shaking.  Sweating.  Chills.  Confusion.  Drink plenty of water while you exercise to prevent  dehydration or heat stroke. Body water is lost during exercise and must be replaced.  Talk to your health care provider before starting an exercise program to make sure it is safe for you. Remember, almost any type of activity is better than none.   This information is not intended to replace advice given to you by your health care provider. Make sure you discuss any questions you have with your health care provider.   Document Released: 07/17/2003 Document Revised: 09/10/2014 Document Reviewed: 10/03/2012 Elsevier Interactive Patient Education [employer].

## 2015-09-01 LAB — CBG MONITORING, ED: Glucose-Capillary: 242 mg/dL — ABNORMAL HIGH (ref 65–99)

## 2015-09-24 ENCOUNTER — Emergency Department (HOSPITAL_COMMUNITY)
Admission: EM | Admit: 2015-09-24 | Discharge: 2015-09-24 | Disposition: A | Discharge status: Home / Self Care | Payer: Managed Care, Other (non HMO) | Attending: Emergency Medicine | Admitting: Emergency Medicine

## 2015-09-24 ENCOUNTER — Encounter (HOSPITAL_COMMUNITY): Payer: Self-pay | Admitting: *Deleted

## 2015-09-24 ENCOUNTER — Emergency Department (HOSPITAL_COMMUNITY): Payer: Managed Care, Other (non HMO)

## 2015-09-24 DIAGNOSIS — Z87891 Personal history of nicotine dependence: Secondary | ICD-10-CM | POA: Insufficient documentation

## 2015-09-24 DIAGNOSIS — R51 Headache: Secondary | ICD-10-CM | POA: Insufficient documentation

## 2015-09-24 DIAGNOSIS — Z79899 Other long term (current) drug therapy: Secondary | ICD-10-CM | POA: Insufficient documentation

## 2015-09-24 DIAGNOSIS — G40909 Epilepsy, unspecified, not intractable, without status epilepticus: Secondary | ICD-10-CM | POA: Insufficient documentation

## 2015-09-24 DIAGNOSIS — E1165 Type 2 diabetes mellitus with hyperglycemia: Secondary | ICD-10-CM | POA: Insufficient documentation

## 2015-09-24 DIAGNOSIS — Z7984 Long term (current) use of oral hypoglycemic drugs: Secondary | ICD-10-CM | POA: Insufficient documentation

## 2015-09-24 DIAGNOSIS — R079 Chest pain, unspecified: Secondary | ICD-10-CM | POA: Insufficient documentation

## 2015-09-24 DIAGNOSIS — R739 Hyperglycemia, unspecified: Secondary | ICD-10-CM

## 2015-09-24 DIAGNOSIS — Z86011 Personal history of benign neoplasm of the brain: Secondary | ICD-10-CM | POA: Insufficient documentation

## 2015-09-24 DIAGNOSIS — I10 Essential (primary) hypertension: Secondary | ICD-10-CM | POA: Insufficient documentation

## 2015-09-24 DIAGNOSIS — R519 Headache, unspecified: Secondary | ICD-10-CM

## 2015-09-24 LAB — URINALYSIS, ROUTINE W REFLEX MICROSCOPIC
BILIRUBIN URINE: NEGATIVE
Glucose, UA: >1000 mg/dL — AB
Hgb urine dipstick: NEGATIVE
KETONES UR: NEGATIVE mg/dL
LEUKOCYTES UA: NEGATIVE
NITRITE: NEGATIVE
PROTEIN: 30 mg/dL — AB
Specific Gravity, Urine: 1.029 (ref 1.005–1.030)
pH: 5.5 (ref 5.0–8.0)

## 2015-09-24 LAB — URINE MICROSCOPIC-ADD ON
Bacteria, UA: NONE SEEN
RBC / HPF: NONE SEEN RBC/hpf (ref 0–5)
WBC UA: NONE SEEN WBC/hpf (ref 0–5)

## 2015-09-24 LAB — CBC WITH DIFFERENTIAL/PLATELET
BASOS ABS: 0.1 10*3/uL (ref 0.0–0.1)
Basophils Relative: 1 %
EOS ABS: 0.4 10*3/uL (ref 0.0–0.7)
Eosinophils Relative: 4 %
HCT: 39.1 % (ref 39.0–52.0)
Hemoglobin: 14 g/dL (ref 13.0–17.0)
LYMPHS ABS: 2 10*3/uL (ref 0.7–4.0)
Lymphocytes Relative: 20 %
MCH: 26.7 pg (ref 26.0–34.0)
MCHC: 35.8 g/dL (ref 30.0–36.0)
MCV: 74.5 fL — ABNORMAL LOW (ref 78.0–100.0)
MONO ABS: 0.6 10*3/uL (ref 0.1–1.0)
MONOS PCT: 6 %
Neutro Abs: 6.9 10*3/uL (ref 1.7–7.7)
Neutrophils Relative %: 69 %
PLATELETS: 300 10*3/uL (ref 150–400)
RBC: 5.25 MIL/uL (ref 4.22–5.81)
RDW: 14.5 % (ref 11.5–15.5)
WBC: 10 10*3/uL (ref 4.0–10.5)

## 2015-09-24 LAB — BASIC METABOLIC PANEL
Anion gap: 11 (ref 5–15)
BUN: 10 mg/dL (ref 6–20)
CALCIUM: 9.3 mg/dL (ref 8.9–10.3)
CO2: 27 mmol/L (ref 22–32)
CREATININE: 0.95 mg/dL (ref 0.61–1.24)
Chloride: 95 mmol/L — ABNORMAL LOW (ref 101–111)
GFR calc Af Amer: >60 mL/min (ref >60)
Glucose, Bld: 315 mg/dL — ABNORMAL HIGH (ref 65–99)
POTASSIUM: 4.5 mmol/L (ref 3.5–5.1)
SODIUM: 133 mmol/L — AB (ref 135–145)

## 2015-09-24 MED ORDER — METFORMIN HCL 500 MG PO TABS: 500.0000 mg | ORAL_TABLET | Freq: Two times a day (BID) | ORAL | Status: DC

## 2015-09-24 MED ORDER — METHYLPREDNISOLONE SODIUM SUCC 125 MG IJ SOLR
125.0000 mg | Freq: Once | INTRAMUSCULAR | Status: AC
Start: 2015-09-24 — End: 2015-09-24
  Administered 2015-09-24: 125 mg via INTRAVENOUS
  Filled 2015-09-24: qty 2

## 2015-09-24 MED ORDER — TETRACAINE HCL 0.5 % OP SOLN
2.0000 [drp] | Freq: Once | OPHTHALMIC | Status: AC
  Administered 2015-09-24: 2 [drp] via OPHTHALMIC
  Filled 2015-09-24: qty 2

## 2015-09-24 MED ORDER — DIPHENHYDRAMINE HCL 25 MG PO CAPS
25.0000 mg | ORAL_CAPSULE | Freq: Once | ORAL | Status: AC
  Administered 2015-09-24: 25 mg via ORAL
  Filled 2015-09-24: qty 1

## 2015-09-24 MED ORDER — METOCLOPRAMIDE HCL 5 MG/ML IJ SOLN
10.0000 mg | Freq: Once | INTRAMUSCULAR | Status: AC
  Administered 2015-09-24: 10 mg via INTRAVENOUS
  Filled 2015-09-24: qty 2

## 2015-09-24 MED ORDER — KETOROLAC TROMETHAMINE 30 MG/ML IJ SOLN
30.0000 mg | Freq: Once | INTRAMUSCULAR | Status: AC
  Administered 2015-09-24: 30 mg via INTRAVENOUS
  Filled 2015-09-24: qty 1

## 2015-09-24 MED ORDER — SODIUM CHLORIDE 0.9 % IV BOLUS (SEPSIS)
500.0000 mL | Freq: Once | INTRAVENOUS | Status: AC
  Administered 2015-09-24: 500 mL via INTRAVENOUS

## 2015-09-24 MED ORDER — AMLODIPINE BESY-BENAZEPRIL HCL 10-20 MG PO CAPS: 1.0000 | ORAL_CAPSULE | Freq: Every day | ORAL | Status: DC

## 2015-09-24 MED ORDER — FUROSEMIDE 40 MG PO TABS: 40.0000 mg | ORAL_TABLET | Freq: Every day | ORAL | Status: DC

## 2015-09-24 MED ORDER — SODIUM CHLORIDE 0.9 % IV BOLUS (SEPSIS)
1000.0000 mL | Freq: Once | INTRAVENOUS | Status: AC
  Administered 2015-09-24: 1000 mL via INTRAVENOUS

## 2015-09-24 NOTE — Discharge Instructions (Signed)
Read the information below.  Use the prescribed medication as directed.  Please discuss all new medications with your pharmacist.  You may return to the Emergency Department at any time for worsening condition or any new symptoms that concern you.    You are having a headache. No specific cause was found today for your headache. It may have been a migraine or other cause of headache. Stress, anxiety, fatigue, and depression are common triggers for headaches. Your headache today does not appear to be life-threatening or require hospitalization, but often the exact cause of headaches is not determined in the emergency department. Therefore, follow-up with your doctor is very important to find out what may have caused your headache, and whether or not you need any further diagnostic testing or treatment. Sometimes headaches can appear benign (not harmful), but then more serious symptoms can develop which should prompt an immediate re-evaluation by your doctor or the emergency department. SEEK MEDICAL ATTENTION IF: You develop possible problems with medications prescribed.  The medications don't resolve your headache, if it recurs , or if you have multiple episodes of vomiting or can't take fluids. You have a change from the usual headache. RETURN IMMEDIATELY IF you develop a sudden, severe headache or confusion, become poorly responsive or faint, develop a fever above 100.41F or problem breathing, have a change in speech, vision, swallowing, or understanding, or develop new weakness, numbness, tingling, incoordination, or have a seizure.    General Headache Without Cause A headache is pain or discomfort felt around the head or neck area. The specific cause of a headache may not be found. There are many causes and types of headaches. A few common ones are:  Tension headaches.  Migraine headaches.  Cluster headaches.  Chronic daily headaches. HOME CARE INSTRUCTIONS  Watch your condition for any  changes. Take these steps to help with your condition: Managing Pain  Take over-the-counter and prescription medicines only as told by your health care provider.  Lie down in a dark, quiet room when you have a headache.  If directed, apply ice to the head and neck area:  Put ice in a plastic bag.  Place a towel between your skin and the bag.  Leave the ice on for 20 minutes, 2-3 times per day.  Use a heating pad or hot shower to apply heat to the head and neck area as told by your health care provider.  Keep lights dim if bright lights bother you or make your headaches worse. Eating and Drinking  Eat meals on a regular schedule.  Limit alcohol use.  Decrease the amount of caffeine you drink, or stop drinking caffeine. General Instructions  Keep all follow-up visits as told by your health care provider. This is important.  Keep a headache journal to help find out what may trigger your headaches. For example, write down:  What you eat and drink.  How much sleep you get.  Any change to your diet or medicines.  Try massage or other relaxation techniques.  Limit stress.  Sit up straight, and do not tense your muscles.  Do not use tobacco products, including cigarettes, chewing tobacco, or e-cigarettes. If you need help quitting, ask your health care provider.  Exercise regularly as told by your health care provider.  Sleep on a regular schedule. Get 7-9 hours of sleep, or the amount recommended by your health care provider. SEEK MEDICAL CARE IF:   Your symptoms are not helped by medicine.  You have a headache  that is different from the usual headache.  You have nausea or you vomit.  You have a fever. SEEK IMMEDIATE MEDICAL CARE IF:   Your headache becomes severe.  You have repeated vomiting.  You have a stiff neck.  You have a loss of vision.  You have problems with speech.  You have pain in the eye or ear.  You have muscular weakness or loss of  muscle control.  You lose your balance or have trouble walking.  You feel faint or pass out.  You have confusion.   This information is not intended to replace advice given to you by your health care provider. Make sure you discuss any questions you have with your health care provider.   Document Released: 04/26/2005 Document Revised: 01/15/2015 Document Reviewed: 08/19/2014 Elsevier Interactive Patient Education 2016 Elsevier Inc.  Hyperglycemia High blood sugar (hyperglycemia) means that the level of sugar in your blood is higher than it should be. Signs of high blood sugar include:  Feeling thirsty.  Frequent peeing (urinating).  Feeling tired or sleepy.  Dry mouth.  Vision changes.  Feeling weak.  Feeling hungry but losing weight.  Numbness and tingling in your hands or feet.  Headache. When you ignore these signs, your blood sugar may keep going up. These problems may get worse, and other problems may begin. HOME CARE  Check your blood sugars as told by your doctor. Write down the numbers with the date and time.  Take the right amount of insulin or diabetes pills at the right time. Write down the dose with date and time.  Refill your insulin or diabetes pills before running out.  Watch what you eat. Follow your meal plan.  Drink liquids without sugar, such as water. Check with your doctor if you have kidney or heart disease.  Follow your doctor's orders for exercise. Exercise at the same time of day.  Keep your doctor's appointments. GET HELP RIGHT AWAY IF:   You have trouble thinking or are confused.  You have fast breathing with fruity smelling breath.  You pass out (faint).  You have 2 to 3 days of high blood sugars and you do not know why.  You have chest pain.  You are feeling sick to your stomach (nauseous) or throwing up (vomiting).  You have sudden vision changes. MAKE SURE YOU:   Understand these instructions.  Will watch your  condition.  Will get help right away if you are not doing well or get worse.   This information is not intended to replace advice given to you by your health care provider. Make sure you discuss any questions you have with your health care provider.   Document Released: 02/21/2009 Document Revised: 05/17/2014 Document Reviewed: 12/31/2014 Elsevier Interactive Patient Education 2016 [place] [employer] 211 is a great source of information about community services available.  Access by dialing 2-1-1 from anywhere in [place], or by website -  CustodianSupply.fi.   Other Local Resources (Updated 05/2015)  [place]    Phone Number and Address  [place] medical care - 1st and 3rd Saturday of every month  Must not qualify for public or private insurance and must have limited income [phone] [address] [place], [place]  Child care  Emergency assistance for housing and [employer]  Medicaid [phone] [address]  [place], [place] [address]   [facility] Department  Low-cost medical care for children, communicable diseases, sexually-transmitted diseases, immunizations, maternity care, womens health and family planning [phone] [address] [place], [place] [address]  [facility]   Medication assistance for [facility] residents  Must meet income requirements [phone] [place], [place].    [place]  Child care  Emergency assistance for housing and [employer]  Medicaid [phone] [address] [place], [place] [address]  [facility] and [place]   Low-cost medical care,   Monday through Friday, 9 am to 6 pm.   Accepts  Medicare/Medicaid, and self-pay [phone] [address] [place], [place] [address]  [facility] for [place] care - Monday through Friday, 8:30 am - 5:30 pm  Accepts Medicaid and self-pay [phone] [address] [place], [place] [address]   [facility]  Primary medical care, including for those with sickle cell disease  Accepts Medicare, Medicaid, insurance and self-pay [phone] [place], [place]  [facility]   Primary medical care  Accepts Medicare, [place], insurance and self-pay [phone], [place], [place] [address]   [facility] Department of Social Services  Child care  Emergency assistance for housing and [employer]  Medicaid [phone] [address] [place], [name] [address]  [place] Department of Health and [employer]  Child care  Emergency assistance for housing and [employer]  Medicaid [phone] [place], [place] [address]   [facility] Medication Assistance Program  Medication assistance for [facility] residents with no insurance only  Must have a primary care doctor [phone] [address], [place], [place]  [facility]   Primary medical care  [place], [place], insurance  [phone] [address] [place], [place]  [facility]   Medication assistance [phone]  [facility]   Primary medical care  Accepts Medicare, [place], insurance and self-pay [phone] [address], [place] [address]  [facility]   Primary medical care  Accepts Medicare, [place], insurance and self-pay [phone] [address] [place], [place] [address]  [facility]  For [place] residents between the ages of 30 and 46 who do not have any  form of health insurance, Medicare, [place], or [place] benefits.  Services are provided free of charge to uninsured patients who fall within federal poverty guidelines.    Hours: Tuesdays and Thursdays, 4:15 - 8 pm [phone] [address] [place],  [address]  [facility]     Primary medical care  Dental care  Nutritional counseling  Pharmacy  Accepts Medicaid, Medicare, most insurance.  Fees are adjusted based on ability to pay.   [place], [place] [address] [place], [place], [facility], [place], [facility] [place], [place]  [facility]  Womens health and family planning [phone] [place]. [place], [place] care  Emergency assistance for housing and [employer]  Medicaid [phone]  [place], [place] [address]   [facility]    Ages 21 and older  Hours: Mondays and Thursdays, 7:00 am - 9:00 am Patients are seen on a first come, first served basis. [phone], ext. [place], [place]  Child care  Emergency assistance for housing and [employer]  Medicaid [phone] [address], [place] [address]  [facility]  Medication assistance  Rental assistance  Food pantry  Medication assistance  Housing assistance  Emergency food distribution  Utility assistance [address] [place], [place]  [place]. [place], [place] [address] Hours: Tuesdays and Thursdays from 9am - 12  noon by appointment only  [place], [place] [address]  [facility] [place] private insurance, [place], and [place].  Payment is based on a sliding scale for those without insurance.  Hours: Mondays, Tuesdays and Thursdays, 8:30 am - 5:30 pm.   [phone] [place], [place]  [facility] - Family Medicine at [facility], [place], and [place].  Payment is based on a sliding scale for those without insurance. [phone] [address] [place], [place]  [facility] - Pediatrics at [address] Scientist, research (medical), [employer], and [place].  Payment is based on a sliding scale for those without insurance [phone] [address] [place], [employer], [place]  [facility] - Pediatrics at [employer], [place], and [place].  Payment is based on a sliding scale for those without insurance. [phone] [place], [place]  [facility] - Pediatrics at [facility], [place], and [place].  Payment is based on a sliding scale for those without insurance. [phone], ext. [phone] [place], [place].    [place] care.  Accepts Medicaid and self-pay. [place], [place]

## 2015-09-24 NOTE — ED Notes (Signed)
Patient transported to X-ray 

## 2015-09-24 NOTE — ED Notes (Signed)
Pt is in stable condition upon d/c and ambulates from ED. 

## 2015-09-24 NOTE — ED Notes (Signed)
Checked patients eyes pts was 20/70 left eye, right eye was 20/40 both eyes 20/40

## 2015-09-24 NOTE — ED Notes (Signed)
Pt has c/o blurred vision in mainly his right eye and headache. Pt states he has a brain tumor that is doesn't believe is cancerous and have been no changes in growth per pt.

## 2015-09-24 NOTE — ED Provider Notes (Signed)
CSN: [id number]     Arrival date & time 09/24/15  [id number] History   First MD Initiated Contact with Patient 09/24/15 1002     Chief Complaint  Patient presents with  . Blurred Vision  . Headache     (Consider location/radiation/quality/duration/timing/severity/associated sxs/prior Treatment) The history is provided by the patient.     Pt with hx seizures, meningioma, HTN, DM p/w headache and right eye blurriness.  The headache is global and throbbing, 6/10 intensity, started yesterday, improved since yesterday.  Took 2 tylenol with only very mild relief (7/10 to 6/10).  Pt gets similar headaches twice a week, yesterday he thought it might be related to drinking 2 glasses of lemonade.  He drank water all day to help.  +sensitive to light and sound.  Has had headaches twice weekly for years.    Thinks he also had a seizure last night.  Was watching tv and then woke up half on and half off the bed with similar sensation that he gets after seizures- generalized weakness, legs hurting.  Denies injury, loss of control of bladder.  Takes keppra as directed for seizures.  Also c/o small painful area in the right breast that began yesterday.  Notes it has been intermittently painful since he was 60 years old.    Denies fevers, head trauma, neck stiffness, change from typical headache.  Does not see a neurologist.    Past Medical History  Diagnosis Date  . Sleep apnea   . Seizures ([place])   . Epilepsy ([place])   . Hypertension   . Diabetes mellitus without complication ([place])   . 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 No past surgeries    . Cardiac catheterization      pt's wife denies cardiac cath   Family History  Problem Relation Age of Onset  . Family history unknown: Yes   Social History  Substance Use Topics  . Smoking status: Former Research scientist (life sciences)  . Smokeless tobacco: Never Used  . Alcohol Use: No    Review of Systems  All other systems  reviewed and are negative.     Allergies  Review of patient's allergies indicates no known allergies.  Home Medications   Prior to Admission medications   Medication Sig Start Date End Date Taking? Authorizing Provider  acetaminophen (TYLENOL) 325 MG tablet Take 650 mg by mouth every 6 (six) hours as needed for moderate pain.    Historical Provider, MD  albuterol (PROVENTIL HFA;VENTOLIN HFA) 108 (90 BASE) MCG/ACT inhaler Inhale 2 puffs into the lungs every 4 (four) hours as needed for wheezing or shortness of breath. 04/09/15   Hannah Muthersbaugh, PA-C  amLODipine-benazepril (LOTREL) 10-20 MG capsule Take 1 capsule by mouth daily. 08/30/15   Charlann Lange, PA-C  furosemide (LASIX) 40 MG tablet Take 1 tablet (40 mg total) by mouth daily. 06/14/15   Merrily Pew, MD  HYDROcodone-acetaminophen (NORCO/VICODIN) 5-325 MG tablet Take 1-2 tablets every 6 hours as needed for severe pain Patient not taking: Reported on 08/30/2015 04/16/15   Carlisle Cater, PA-C  levETIRAcetam (KEPPRA) 1000 MG tablet Take 1 tablet (1,000 mg total) by mouth 2 (two) times daily. 06/14/15   Merrily Pew, MD  metFORMIN (GLUCOPHAGE) 500 MG tablet Take 1 tablet (500 mg total) by mouth 2 (two) times daily with a meal. 08/30/15   Shari Upstill, PA-C   BP 146/84 mmHg  Pulse 88  Temp(Src) 98.6 F (37 C) (Oral)  Resp 20  SpO2 94% Physical Exam  Constitutional: He appears well-developed and well-nourished. No distress.  HENT:  Head: Normocephalic and atraumatic.  No temporal tenderness  Eyes: Conjunctivae and EOM are normal. Right eye exhibits no discharge. Left eye exhibits no discharge. No scleral icterus.  Right eye pressure 21  Neck: Normal range of motion. Neck supple.  Cardiovascular: Normal rate and regular rhythm.   Pulmonary/Chest: Effort normal and breath sounds normal. No respiratory distress. He has no wheezes. He has no rales.    Abdominal: Soft. He exhibits no distension and no mass. There is no tenderness.  There is no rebound and no guarding.  obese  Neurological: He is alert. He exhibits normal muscle tone.  CN II-XII intact, EOMs intact, no pronator drift, grip strengths equal bilaterally; strength 5/5 in all extremities, sensation intact in all extremities; finger to nose, heel to shin, rapid alternating movements normal; gait is normal.     Skin: He is not diaphoretic.  Nursing note and vitals reviewed.  20/70 left eye, right eye was 20/40 both eyes 20/40 ED Course  Procedures (including critical care time) Labs Review Labs Reviewed  BASIC METABOLIC PANEL - Abnormal; Notable for the following:    Sodium 133 (*)    Chloride 95 (*)    Glucose, Bld 315 (*)    All other components within normal limits  CBC WITH DIFFERENTIAL/PLATELET - Abnormal; Notable for the following:    MCV 74.5 (*)    All other components within normal limits  URINALYSIS, ROUTINE W REFLEX MICROSCOPIC (NOT AT [facility]) - Abnormal; Notable for the following:    Glucose, UA >1000 (*)    Protein, ur 30 (*)    All other components within normal limits  URINE MICROSCOPIC-ADD ON - Abnormal; Notable for the following:    Squamous Epithelial / LPF 0-5 (*)    All other components within normal limits    Imaging Review No results found. I have personally reviewed and evaluated these images and lab results as part of my medical decision-making.   EKG Interpretation None       EMERGENCY DEPARTMENT US SOFT TISSUE INTERPRETATION "Study: Limited Ultrasound of the noted body part in comments below"  INDICATIONS: Pain Multiple views of the body part are obtained with a multi-frequency linear probe  PERFORMED BY:  Myself  IMAGES ARCHIVED?: Yes  SIDE:Right   BODY PART:Chest Wall  FINDINGS: No abcess noted  LIMITATIONS:  Emergent Procedure  INTERPRETATION:  No abcess noted and No cellulitis noted  COMMENT:  Unremarkable right breast    2:36 PM Complete relief of headache  MDM   Final diagnoses:    Nonintractable episodic headache, unspecified headache type  Hyperglycemia    Afebrile, nontoxic patient with hx seizure, brain tumor (meningioma), HTN, DM p/w headache, generalized weakness and body aches that he feels indicates breakthrough seizure last night, area of right breast tenderness. Head CT scan performed 08/29/15 shows stable findings. Labs remarkable for hyperglycemia, mild dehydration.  UA demonstrates glucosuria, proteinuria.  Eye pressure on right 21.  IVF, reglan, benadryl, toradol, solu-medrolgiven for headache with complete relief.   Neurology referral given and ambulatory referral made through the order system.  Right breast tenderness has been ongoing for decades, no e/o abscess or cellulitis, no palpable mass.    D/C home with refill of his home medications, PCP, neurology follow up.  Discussed result, findings, treatment, and follow up  with patient.  Pt given return precautions.  Pt verbalizes understanding and agrees  with plan.          Clayton Bibles, PA-C 09/24/15 [place], MD 09/24/15 [phone]

## 2015-11-10 ENCOUNTER — Encounter (HOSPITAL_COMMUNITY): Payer: Self-pay

## 2015-11-10 ENCOUNTER — Emergency Department (HOSPITAL_COMMUNITY)
Admission: EM | Admit: 2015-11-10 | Discharge: 2015-11-10 | Disposition: A | Discharge status: Home / Self Care | Payer: Managed Care, Other (non HMO) | Attending: Emergency Medicine | Admitting: Emergency Medicine

## 2015-11-10 DIAGNOSIS — G40909 Epilepsy, unspecified, not intractable, without status epilepticus: Secondary | ICD-10-CM | POA: Insufficient documentation

## 2015-11-10 DIAGNOSIS — Z7984 Long term (current) use of oral hypoglycemic drugs: Secondary | ICD-10-CM | POA: Insufficient documentation

## 2015-11-10 DIAGNOSIS — Z87891 Personal history of nicotine dependence: Secondary | ICD-10-CM | POA: Insufficient documentation

## 2015-11-10 DIAGNOSIS — R569 Unspecified convulsions: Secondary | ICD-10-CM

## 2015-11-10 DIAGNOSIS — I10 Essential (primary) hypertension: Secondary | ICD-10-CM | POA: Insufficient documentation

## 2015-11-10 DIAGNOSIS — E119 Type 2 diabetes mellitus without complications: Secondary | ICD-10-CM | POA: Insufficient documentation

## 2015-11-10 LAB — COMPREHENSIVE METABOLIC PANEL
ALBUMIN: 3.6 g/dL (ref 3.5–5.0)
ALT: 46 U/L (ref 17–63)
AST: 29 U/L (ref 15–41)
Alkaline Phosphatase: 68 U/L (ref 38–126)
Anion gap: 8 (ref 5–15)
BUN: 11 mg/dL (ref 6–20)
CHLORIDE: 101 mmol/L (ref 101–111)
CO2: 26 mmol/L (ref 22–32)
Calcium: 9.5 mg/dL (ref 8.9–10.3)
Creatinine, Ser: 1.04 mg/dL (ref 0.61–1.24)
GFR calc Af Amer: >60 mL/min (ref >60)
GLUCOSE: 189 mg/dL — AB (ref 65–99)
POTASSIUM: 4.2 mmol/L (ref 3.5–5.1)
Sodium: 135 mmol/L (ref 135–145)
Total Bilirubin: 0.5 mg/dL (ref 0.3–1.2)
Total Protein: 7.4 g/dL (ref 6.5–8.1)

## 2015-11-10 LAB — CBC
HEMATOCRIT: 39.9 % (ref 39.0–52.0)
HEMOGLOBIN: 13.6 g/dL (ref 13.0–17.0)
MCH: 25.8 pg — ABNORMAL LOW (ref 26.0–34.0)
MCHC: 34.1 g/dL (ref 30.0–36.0)
MCV: 75.6 fL — AB (ref 78.0–100.0)
Platelets: 298 10*3/uL (ref 150–400)
RBC: 5.28 MIL/uL (ref 4.22–5.81)
RDW: 13.9 % (ref 11.5–15.5)
WBC: 12 10*3/uL — AB (ref 4.0–10.5)

## 2015-11-10 LAB — CBG MONITORING, ED: Glucose-Capillary: 188 mg/dL — ABNORMAL HIGH (ref 65–99)

## 2015-11-10 MED ORDER — LEVETIRACETAM 1000 MG PO TABS: 1000.0000 mg | ORAL_TABLET | Freq: Two times a day (BID) | ORAL | Status: DC

## 2015-11-10 MED ORDER — AMLODIPINE BESY-BENAZEPRIL HCL 10-20 MG PO CAPS: 1.0000 | ORAL_CAPSULE | Freq: Every day | ORAL | Status: DC

## 2015-11-10 MED ORDER — FUROSEMIDE 40 MG PO TABS: 40.0000 mg | ORAL_TABLET | Freq: Every day | ORAL | Status: DC

## 2015-11-10 MED ORDER — METFORMIN HCL 500 MG PO TABS: 500.0000 mg | ORAL_TABLET | Freq: Two times a day (BID) | ORAL | Status: DC

## 2015-11-10 NOTE — ED Notes (Signed)
Pt verbalized understanding of d/c instructions and prescription use and has no further questions. Pt stable NAD. Pt urged by this RN to get back in with pcp and neurologist asap. Pt has no insurance at this time but will again soon.

## 2015-11-10 NOTE — ED Notes (Addendum)
Per EMS, pt from home having issues with seizures recently. In the past week the pt "blanks out" during conversation.  CBG 158, spo2 98%, 80 HR NSR, BP 163/76. Pt takes keppra for seizures. LSN at 1100 per the pt's wife. Pt had an episode today where pt began mumbling and stuttering and wife states this typical for pt right before seizures. Pt not answering questions appropriate and not oriented to norm but is alert. Last seizure was in February and has not had a full seizure since. Pt answering questions appropriately here. Pt states "I don't think I ate like I should have today"

## 2015-11-10 NOTE — ED Provider Notes (Signed)
CSN: [id number]     Arrival date & time 11/10/15  1911 History   First MD Initiated Contact with Patient 11/10/15 1914     Chief Complaint  Patient presents with  . Seizures  . Altered Mental Status     (Consider location/radiation/quality/duration/timing/severity/associated sxs/prior Treatment) Patient is a 60 y.o. male presenting with seizures. The history is provided by the patient and the spouse.  Seizures Seizure activity on arrival: no   Seizure type:  Unable to specify Preceding symptoms comment:  None Initial focality:  Unable to specify Episode characteristics comment:  Episode was unwitnessed. Patient endorses he woke up after the seizure and was able to deduce that he had seized. Wife called as he was waking up.  Postictal symptoms: confusion and somnolence   Return to baseline: yes   Duration: unknown, patient believes minutes. Timing:  Once Number of seizures this episode:  1 Progression since onset: last seizure in febrary when keppra increased to 1000 BID. Context: intracranial lesion (known meningioma)   Recent head injury:  No recent head injuries PTA treatment:  None History of seizures: yes     Past Medical History  Diagnosis Date  . Sleep apnea   . Seizures ([place])   . Epilepsy ([place])   . Hypertension   . Diabetes mellitus without complication ([place])   . 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 No past surgeries    . Cardiac catheterization      pt's wife denies cardiac cath   Family History  Problem Relation Age of Onset  . Family history unknown: Yes   Social History  Substance Use Topics  . Smoking status: Former Research scientist (life sciences)  . Smokeless tobacco: Never Used  . Alcohol Use: No    Review of Systems  Constitutional: Negative for fever and chills.  HENT: Negative for congestion.   Eyes: Negative for visual disturbance.  Respiratory: Negative for cough and shortness of breath.   Cardiovascular: Negative  for palpitations.  Gastrointestinal: Negative for nausea, vomiting, abdominal pain and diarrhea.  Genitourinary: Negative for dysuria.  Musculoskeletal: Negative for back pain.  Skin: Negative for pallor and wound.  Neurological: Positive for seizures.  Psychiatric/Behavioral: Positive for confusion. Negative for agitation.      Allergies  Review of patient's allergies indicates no known allergies.  Home Medications   Prior to Admission medications   Medication Sig Start Date End Date Taking? Authorizing Provider  acetaminophen (TYLENOL) 325 MG tablet Take 650 mg by mouth every 6 (six) hours as needed for moderate pain.    Historical Provider, MD  albuterol (PROVENTIL HFA;VENTOLIN HFA) 108 (90 BASE) MCG/ACT inhaler Inhale 2 puffs into the lungs every 4 (four) hours as needed for wheezing or shortness of breath. 04/09/15   Hannah Muthersbaugh, PA-C  amLODipine-benazepril (LOTREL) 10-20 MG capsule Take 1 capsule by mouth daily. 09/24/15   Clayton Bibles, PA-C  furosemide (LASIX) 40 MG tablet Take 1 tablet (40 mg total) by mouth daily. 09/24/15   Clayton Bibles, PA-C  HYDROcodone-acetaminophen (NORCO/VICODIN) 5-325 MG tablet Take 1-2 tablets every 6 hours as needed for severe pain Patient not taking: Reported on 08/30/2015 04/16/15   Carlisle Cater, PA-C  levETIRAcetam (KEPPRA) 1000 MG tablet Take 1 tablet (1,000 mg total) by mouth 2 (two) times daily. 06/14/15   Merrily Pew, MD  metFORMIN (GLUCOPHAGE) 500 MG tablet Take 1 tablet (500 mg total) by mouth 2 (two) times daily with a meal. 09/24/15  Clayton Bibles, PA-C   There were no vitals taken for this visit. Physical Exam  Constitutional: He is oriented to person, place, and time. He appears well-developed and well-nourished. No distress.  HENT:  Head: Normocephalic and atraumatic.  Eyes: Conjunctivae are normal.  Cardiovascular: Normal rate and normal heart sounds.   No murmur heard. Pulmonary/Chest: Effort normal and breath sounds normal.   Abdominal: Soft. There is no tenderness.  Musculoskeletal: He exhibits no edema.  Neurological: He is alert and oriented to person, place, and time. No cranial nerve deficit.  Normal strength in all 4 extremities  Skin: Skin is warm. He is not diaphoretic.  Psychiatric: He has a normal mood and affect. His behavior is normal.  Nursing note and vitals reviewed.   ED Course  Procedures (including critical care time) Labs Review Labs Reviewed - No data to display  Imaging Review No results found. I have personally reviewed and evaluated these images and lab results as part of my medical decision-making.   EKG Interpretation None      MDM   Final diagnoses:  None    Patient presents for seizure activity at home. Unwitnessed. Resolved spontaneously prior to arrival. Patient fully alert on arrival. Does not remember incident. CBC and CMP unremarkable. Glucose within normal limits. No recent illness or lack of sleep.  Last seizure was approximately 5 months ago. Has not missed any doses of keppra. Plan to follow up with neurology in next week. Patient given strict return precautions. Refills for home meds provided as he has not taken BP meds in last week.   Discharged home in good condition. Seen with Dr. Tomi Bamberger.      Allie Bossier, MD 11/11/15 [place], MD 11/12/15 [phone]

## 2015-11-10 NOTE — ED Provider Notes (Signed)
Pt has a history of seizures associated with a meningioma.  Pt is stable in the ED.  No focal neuro deficits.  He has been taking his medications.   Previous CT scans were reviewed.  His meningioma has been stable.  Will monitor in the ED.  Plan on DC home if no recurrent seziures.  Dorie Rank, MD 11/10/15 2032

## 2015-11-10 NOTE — Discharge Instructions (Signed)
Seizure, Adult A seizure is abnormal electrical activity in the brain. Seizures usually last from 30 seconds to 2 minutes. There are various types of seizures. Before a seizure, you may have a warning sensation (aura) that a seizure is about to occur. An aura may include the following symptoms:   Fear or anxiety.  Nausea.  Feeling like the room is spinning (vertigo).  Vision changes, such as seeing flashing lights or spots. Common symptoms during a seizure include:  A change in attention or behavior (altered mental status).  Convulsions with rhythmic jerking movements.  Drooling.  Rapid eye movements.  Grunting.  Loss of bladder and bowel control.  Bitter taste in the mouth.  Tongue biting. After a seizure, you may feel confused and sleepy. You may also have an injury resulting from convulsions during the seizure. HOME CARE INSTRUCTIONS   If you are given medicines, take them exactly as prescribed by your health care provider.  Keep all follow-up appointments as directed by your health care provider.  Do not swim or drive or engage in risky activity during which a seizure could cause further injury to you or others until your health care provider says it is OK.  Get adequate rest.  Teach friends and family what to do if you have a seizure. They should:  Lay you on the ground to prevent a fall.  Put a cushion under your head.  Loosen any tight clothing around your neck.  Turn you on your side. If vomiting occurs, this helps keep your airway clear.  Stay with you until you recover.  Know whether or not you need emergency care. SEEK IMMEDIATE MEDICAL CARE IF:  The seizure lasts longer than 5 minutes.  The seizure is severe or you do not wake up immediately after the seizure.  You have an altered mental status after the seizure.  You are having more frequent or worsening seizures. Someone should drive you to the emergency department or call local emergency  services (911 in [place] MAKE SURE YOU:  Understand these instructions.  Will watch your condition.  Will get help right away if you are not doing well or get worse.   This information is not intended to replace advice given to you by your health care provider. Make sure you discuss any questions you have with your health care provider.   Document Released: 04/23/2000 Document Revised: 05/17/2014 Document Reviewed: 12/06/2012 Elsevier Interactive Patient Education 2016 [employer]  
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
## 2016-01-04 ENCOUNTER — Emergency Department (HOSPITAL_COMMUNITY)
Admission: EM | Admit: 2016-01-04 | Discharge: 2016-01-05 | Disposition: A | Discharge status: Home / Self Care | Payer: Self-pay | Attending: Emergency Medicine | Admitting: Emergency Medicine

## 2016-01-04 ENCOUNTER — Emergency Department (HOSPITAL_COMMUNITY): Payer: Self-pay

## 2016-01-04 DIAGNOSIS — Z79899 Other long term (current) drug therapy: Secondary | ICD-10-CM | POA: Insufficient documentation

## 2016-01-04 DIAGNOSIS — R569 Unspecified convulsions: Secondary | ICD-10-CM

## 2016-01-04 DIAGNOSIS — R4182 Altered mental status, unspecified: Secondary | ICD-10-CM | POA: Insufficient documentation

## 2016-01-04 DIAGNOSIS — E119 Type 2 diabetes mellitus without complications: Secondary | ICD-10-CM | POA: Insufficient documentation

## 2016-01-04 DIAGNOSIS — Z8673 Personal history of transient ischemic attack (TIA), and cerebral infarction without residual deficits: Secondary | ICD-10-CM | POA: Insufficient documentation

## 2016-01-04 DIAGNOSIS — G40909 Epilepsy, unspecified, not intractable, without status epilepticus: Secondary | ICD-10-CM | POA: Insufficient documentation

## 2016-01-04 DIAGNOSIS — I10 Essential (primary) hypertension: Secondary | ICD-10-CM | POA: Insufficient documentation

## 2016-01-04 DIAGNOSIS — Z87891 Personal history of nicotine dependence: Secondary | ICD-10-CM | POA: Insufficient documentation

## 2016-01-04 DIAGNOSIS — Z7984 Long term (current) use of oral hypoglycemic drugs: Secondary | ICD-10-CM | POA: Insufficient documentation

## 2016-01-04 LAB — COMPREHENSIVE METABOLIC PANEL
ALBUMIN: 3.3 g/dL — AB (ref 3.5–5.0)
ALK PHOS: 67 U/L (ref 38–126)
ALT: 53 U/L (ref 17–63)
AST: 26 U/L (ref 15–41)
Anion gap: 8 (ref 5–15)
BUN: 10 mg/dL (ref 6–20)
CALCIUM: 8.7 mg/dL — AB (ref 8.9–10.3)
CO2: 22 mmol/L (ref 22–32)
CREATININE: 0.94 mg/dL (ref 0.61–1.24)
Chloride: 101 mmol/L (ref 101–111)
GFR calc Af Amer: >60 mL/min (ref >60)
GFR calc non Af Amer: >60 mL/min (ref >60)
GLUCOSE: 120 mg/dL — AB (ref 65–99)
Potassium: 3.8 mmol/L (ref 3.5–5.1)
SODIUM: 131 mmol/L — AB (ref 135–145)
Total Bilirubin: 0.7 mg/dL (ref 0.3–1.2)
Total Protein: 6.4 g/dL — ABNORMAL LOW (ref 6.5–8.1)

## 2016-01-04 LAB — CBC WITH DIFFERENTIAL/PLATELET
BASOS PCT: 0 %
Basophils Absolute: 0.1 10*3/uL (ref 0.0–0.1)
EOS ABS: 0.1 10*3/uL (ref 0.0–0.7)
Eosinophils Relative: 1 %
HCT: 38.2 % — ABNORMAL LOW (ref 39.0–52.0)
HEMOGLOBIN: 13.3 g/dL (ref 13.0–17.0)
LYMPHS ABS: 1.7 10*3/uL (ref 0.7–4.0)
Lymphocytes Relative: 10 %
MCH: 26.3 pg (ref 26.0–34.0)
MCHC: 34.8 g/dL (ref 30.0–36.0)
MCV: 75.6 fL — ABNORMAL LOW (ref 78.0–100.0)
Monocytes Absolute: 1.2 10*3/uL — ABNORMAL HIGH (ref 0.1–1.0)
Monocytes Relative: 7 %
NEUTROS PCT: 82 %
Neutro Abs: 14 10*3/uL — ABNORMAL HIGH (ref 1.7–7.7)
Platelets: 276 10*3/uL (ref 150–400)
RBC: 5.05 MIL/uL (ref 4.22–5.81)
RDW: 14.4 % (ref 11.5–15.5)
WBC: 17.1 10*3/uL — AB (ref 4.0–10.5)

## 2016-01-04 MED ORDER — SODIUM CHLORIDE 0.9 % IV SOLN
500.0000 mg | Freq: Two times a day (BID) | INTRAVENOUS | Status: DC
  Administered 2016-01-04: 500 mg via INTRAVENOUS
  Filled 2016-01-04 (×2): qty 5

## 2016-01-04 MED ORDER — SODIUM CHLORIDE 0.9 % IV BOLUS (SEPSIS)
1000.0000 mL | Freq: Once | INTRAVENOUS | Status: AC
  Administered 2016-01-04: 1000 mL via INTRAVENOUS

## 2016-01-04 MED ORDER — SODIUM CHLORIDE 0.9 % IV SOLN
200.0000 mg | Freq: Two times a day (BID) | INTRAVENOUS | Status: DC
  Administered 2016-01-04: 200 mg via INTRAVENOUS
  Filled 2016-01-04 (×2): qty 20

## 2016-01-04 NOTE — ED Triage Notes (Signed)
Per EMS: pt had seizure today. Pt's family states pt has a seizure about every 6 months. Pt given 10 mg versed IM and 2.5 mg versed IV en route. Pt responses to painful stimulation only. Airway intact.

## 2016-01-04 NOTE — ED Provider Notes (Signed)
[facility] DEPT Provider Note   CSN: [id number] Arrival date & time: 01/04/16  1906     History   Chief Complaint Chief Complaint  Patient presents with  . Seizures    HPI Thomas [name] is a 60 y.o. male.  HPI  Past Medical History:  Diagnosis Date  . Brain tumor ([place])    frontal  . Diabetes mellitus without complication ([place])   . Epilepsy ([place])   . Hypertension   . Seizures ([place])   . Sleep apnea     Patient Active Problem List   Diagnosis Date Noted  . Trauma   . Diabetes mellitus type II, controlled ([place])   . Essential hypertension   . Aspiration pneumonia ([place])   . Acute encephalopathy 04/09/2014  . 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 Acute respiratory failure with hypoxia ([place]) 04/09/2014  . Lactic acidosis 04/09/2014  . Hyperammonemia ([place]) 04/09/2014    Past Surgical History:  Procedure Laterality Date  . CARDIAC CATHETERIZATION     pt's wife denies cardiac cath  . NO PAST SURGERIES         Home Medications    Prior to Admission medications   Medication Sig Start Date End Date Taking? Authorizing Provider  ibuprofen (ADVIL,MOTRIN) 200 MG tablet Take 800 mg by mouth every 6 (six) hours as needed for headache.   Yes Historical Provider, MD  levETIRAcetam (KEPPRA) 1000 MG tablet Take 1,000 mg by mouth 2 (two) times daily.   Yes Historical Provider, MD  UNABLE TO FIND Herbal Life Tea: Mix 1 tablespoonful into 2 cups of water and drink three times a day   Yes Historical Provider, MD  UNABLE TO FIND Protein Powder: Mix into a shake and drink once a day   Yes Historical Provider, MD  acetaminophen (TYLENOL) 500 MG tablet Take 500 mg by mouth every 6 (six) hours as needed (pain).    Historical Provider, MD  albuterol (PROVENTIL HFA;VENTOLIN HFA) 108 (90 BASE) MCG/ACT inhaler Inhale 2 puffs into the lungs every 4 (four) hours as needed for wheezing or shortness of breath. 04/09/15   Hannah Muthersbaugh, PA-C  amLODipine-benazepril  (LOTREL) 10-20 MG capsule Take 1 capsule by mouth daily. 09/24/15   Clayton Bibles, PA-C  amLODipine-benazepril (LOTREL) 10-20 MG capsule Take 1 capsule by mouth daily. 11/10/15   Allie Bossier, MD  amLODipine-benazepril (LOTREL) 10-20 MG capsule Take 1 capsule by mouth daily. 01/05/16   Chapman Moss, MD  furosemide (LASIX) 40 MG tablet Take 1 tablet (40 mg total) by mouth daily. 09/24/15   Clayton Bibles, PA-C  furosemide (LASIX) 40 MG tablet Take 1 tablet (40 mg total) by mouth daily. 11/10/15   Allie Bossier, MD  furosemide (LASIX) 40 MG tablet Take 1 tablet (40 mg total) by mouth daily. 01/05/16   Chapman Moss, MD  HYDROcodone-acetaminophen (NORCO/VICODIN) 5-325 MG tablet Take 1-2 tablets every 6 hours as needed for severe pain Patient not taking: Reported on 08/30/2015 04/16/15   Carlisle Cater, PA-C  Lacosamide (VIMPAT) 100 MG TABS Take 1 tablet (100 mg total) by mouth 2 (two) times daily. 01/05/16   Chapman Moss, MD  levETIRAcetam (KEPPRA) 1000 MG tablet Take 1 tablet (1,000 mg total) by mouth 2 (two) times daily. Patient taking differently: Take 1,000 mg by mouth at bedtime.  06/14/15   Merrily Pew, MD  levETIRAcetam (KEPPRA) 1000 MG tablet Take 1 tablet (1,000 mg total) by mouth 2 (two) times daily. 11/10/15   Allie Bossier, MD  metFORMIN (GLUCOPHAGE)  500 MG tablet Take 1 tablet (500 mg total) by mouth 2 (two) times daily with a meal. 09/24/15   Clayton Bibles, PA-C  metFORMIN (GLUCOPHAGE) 500 MG tablet Take 1 tablet (500 mg total) by mouth 2 (two) times daily with a meal. 11/10/15   Allie Bossier, MD  metFORMIN (GLUCOPHAGE) 500 MG tablet Take 1 tablet (500 mg total) by mouth 2 (two) times daily. 01/05/16   Chapman Moss, MD    Family History Family History  Problem Relation Age of Onset  . Family history unknown: Yes    Social History Social History  Substance Use Topics  . Smoking status: Former Research scientist (life sciences)  . Smokeless tobacco: Never Used  . Alcohol use No     Allergies   Review of patient's allergies indicates  no known allergies.   Review of Systems Review of Systems  Unable to perform ROS: Mental status change  Constitutional: Negative for chills and fever.  Respiratory: Negative for chest tightness and shortness of breath.   Cardiovascular: Negative for chest pain.  Psychiatric/Behavioral: Positive for confusion. Negative for agitation.     Physical Exam Updated Vital Signs BP 153/81   Pulse 79   Resp 17   SpO2 98%   Physical Exam  Constitutional: He is oriented to person, place, and time. He appears well-developed and well-nourished.  HENT:  Head: Normocephalic and atraumatic.  Eyes: Conjunctivae are normal. Pupils are equal, round, and reactive to light.  Neck: Neck supple.  Cardiovascular: Normal rate and regular rhythm.   No murmur heard. Pulmonary/Chest: Effort normal and breath sounds normal. No respiratory distress.  Abdominal: Soft. There is no tenderness.  Musculoskeletal: Normal range of motion. He exhibits no edema.  Neurological: He is alert and oriented to person, place, and time. No cranial nerve deficit. He exhibits normal muscle tone.  Post ictal  Skin: Skin is warm and dry.  Psychiatric: He has a normal mood and affect.  Nursing note and vitals reviewed.    ED Treatments / Results  Labs (all labs ordered are listed, but only abnormal results are displayed) Labs Reviewed  CBC WITH DIFFERENTIAL/PLATELET - Abnormal; Notable for the following:       Result Value   WBC 17.1 (*)    HCT 38.2 (*)    MCV 75.6 (*)    Neutro Abs 14.0 (*)    Monocytes Absolute 1.2 (*)    All other components within normal limits  COMPREHENSIVE METABOLIC PANEL - Abnormal; Notable for the following:    Sodium 131 (*)    Glucose, Bld 120 (*)    Calcium 8.7 (*)    Total Protein 6.4 (*)    Albumin 3.3 (*)    All other components within normal limits  CBG MONITORING, ED    EKG  EKG Interpretation None       Radiology Dg Chest 2 View  Result Date: 01/04/2016 SEE  CLINICAL DATA: AA.  Noncompliant with antihypertensive medications. EXAM: CHEST  2 VIEW COMPARISON:  None. FINDINGS: The heart size and mediastinal contours are within normal limits. Both lungs are clear. The visualized skeletal structures are unremarkable. IMPRESSION: No active cardiopulmonary disease. Electronically Signed   By: Andreas Newport M.D.   On: 01/04/2016 21:10   Ct Head Wo Contrast  Result Date: 01/04/2016 CLINICAL DATA:  Seizure today. EXAM: CT HEAD WITHOUT CONTRAST TECHNIQUE: Contiguous axial images were obtained from the base of the skull through the vertex without intravenous contrast. COMPARISON:  None. FINDINGS: Brain: There is an area  of hypoattenuation in the posterior left frontal region. This may be related to remote ischemia, but acute to subacute edema from infarct or underlying mass lesion could also have this appearance. No substantial ex vacuo dilatation of the left lateral ventricle to suggest chronic infarct. No midline shift. Diffuse loss of parenchymal volume is consistent with atrophy. Patchy low attenuation in the deep hemispheric and periventricular white matter is nonspecific, but likely reflects chronic microvascular ischemic demyelination. Vascular: Atherosclerotic calcification is visualized in the carotid arteries. No dense MCA sign. Major dural sinuses are unremarkable. Skull: No evidence for skull fracture. Sinuses/Orbits: Chronic mucosal disease is identified in the maxillary sinuses. Remaining visualized paranasal sinuses are clear. Visualized portions of the globes and intraorbital fat are unremarkable. Other: N/A IMPRESSION: 1. Hypo attenuation in the posterior left frontal region, nonspecific but subacute ischemia or vasogenic edema from underlying mass lesion not excluded. MRI of the brain without and with contrast would be the study of choice to further evaluate. 2. Atrophy. Electronically Signed   By: Misty Stanley M.D.   On: 01/04/2016 21:39     Procedures Procedures (including critical care time)  Medications Ordered in ED Medications  sodium chloride 0.9 % bolus 1,000 mL (0 mLs Intravenous Stopped 01/05/16 0058)     Initial Impression / Assessment and Plan / ED Course  I have reviewed the triage vital signs and the nursing notes.  Pertinent labs & imaging results that were available during my care of the patient were reviewed by me and considered in my medical decision making (see chart for details).  Clinical Course   Patient is a 60 year old male past medical history of epilepsy as well as previously known brain tumor. Patient brought in by EMS following a seizure at home. Patient currently postictal, but denies fevers chills, nausea or vomiting, chest pain or shortness of breath. Per EMS, patient becomes combative following seizure. Additionally per EMS, patient's last seizure was approximately 6 months ago. Patient medicated with Keppra. Patient states that he has run out of 2 of his medications but states that he has been taking his Keppra as prescribed. Patient states that he has not taken his amlodipine nor his Lasix for the past several days.  Physical exam: Patient notably post ictal, but able to follow commands and answer questions. Patient alert and oriented 3. Cranial nerves II through XII intact. Patient clear to auscultation bilaterally normal S1-S2 no rubs or murmurs gallops abdomen is soft and nontender.  We'll conduct broad-spectrum workup with concern for infection precipitating seizure.  Laboratory workup not concerning for infection. CT head showed vasogenic edema w/ possible mass. Pt aware of brain tumor. I Spoke with neurology who stated they wished to have patient continue his Keppra and to add a second agent.  Neurology recommend beginning lacosamide 100 mg twice daily.  I wrote the patient for this prescription.  Additionally I have written patient 30 day supply of other medications as he does not  currently have a PCP and has run out.  I instructed the patient to follow-up by establishing PCP for further care and prescription renewal.  Patient given appropriate return and follow-up precautions.  Patient amenable to discharge this time.   Final Clinical Impressions(s) / ED Diagnoses   Final diagnoses:  Seizure [facility])    New Prescriptions Discharge Medication List as of 01/05/2016 12:21 AM    START taking these medications   Details  Lacosamide (VIMPAT) 100 MG TABS Take 1 tablet (100 mg total) by mouth 2 (  two) times daily., Starting Mon 01/05/2016, Print         Chapman Moss, MD 01/06/16 [id number]    Elnora Morrison, MD 01/08/16 [id number]

## 2016-01-05 MED ORDER — METFORMIN HCL 500 MG PO TABS: 500.0000 mg | ORAL_TABLET | Freq: Two times a day (BID) | ORAL | 0 refills | Status: DC

## 2016-01-05 MED ORDER — FUROSEMIDE 40 MG PO TABS: 40.0000 mg | ORAL_TABLET | Freq: Every day | ORAL | 0 refills | Status: DC

## 2016-01-05 MED ORDER — LACOSAMIDE 100 MG PO TABS: 100.0000 mg | ORAL_TABLET | Freq: Two times a day (BID) | ORAL | 0 refills | Status: DC

## 2016-01-05 MED ORDER — AMLODIPINE BESY-BENAZEPRIL HCL 10-20 MG PO CAPS: 1.0000 | ORAL_CAPSULE | Freq: Every day | ORAL | 0 refills | Status: DC

## 2016-02-07 ENCOUNTER — Emergency Department (HOSPITAL_COMMUNITY)
Admission: EM | Admit: 2016-02-07 | Discharge: 2016-02-07 | Disposition: A | Discharge status: Home / Self Care | Payer: Managed Care, Other (non HMO) | Attending: Emergency Medicine | Admitting: Emergency Medicine

## 2016-02-07 ENCOUNTER — Encounter (HOSPITAL_COMMUNITY): Payer: Self-pay | Admitting: Emergency Medicine

## 2016-02-07 DIAGNOSIS — I10 Essential (primary) hypertension: Secondary | ICD-10-CM | POA: Insufficient documentation

## 2016-02-07 DIAGNOSIS — Z760 Encounter for issue of repeat prescription: Secondary | ICD-10-CM | POA: Insufficient documentation

## 2016-02-07 DIAGNOSIS — Z7984 Long term (current) use of oral hypoglycemic drugs: Secondary | ICD-10-CM | POA: Insufficient documentation

## 2016-02-07 DIAGNOSIS — E119 Type 2 diabetes mellitus without complications: Secondary | ICD-10-CM | POA: Insufficient documentation

## 2016-02-07 DIAGNOSIS — G40909 Epilepsy, unspecified, not intractable, without status epilepticus: Secondary | ICD-10-CM | POA: Insufficient documentation

## 2016-02-07 DIAGNOSIS — Z87891 Personal history of nicotine dependence: Secondary | ICD-10-CM | POA: Insufficient documentation

## 2016-02-07 MED ORDER — FUROSEMIDE 40 MG PO TABS: 40.0000 mg | ORAL_TABLET | Freq: Every day | ORAL | 0 refills | Status: DC

## 2016-02-07 MED ORDER — PROCHLORPERAZINE EDISYLATE 5 MG/ML IJ SOLN
10.0000 mg | Freq: Once | INTRAMUSCULAR | Status: AC
Start: 2016-02-07 — End: 2016-02-07
  Administered 2016-02-07: 10 mg via INTRAVENOUS
  Filled 2016-02-07: qty 2

## 2016-02-07 MED ORDER — METFORMIN HCL 500 MG PO TABS: 500.0000 mg | ORAL_TABLET | Freq: Two times a day (BID) | ORAL | 1 refills | Status: DC

## 2016-02-07 MED ORDER — LEVETIRACETAM 500 MG/5ML IV SOLN
500.0000 mg | Freq: Once | INTRAVENOUS | Status: AC
  Administered 2016-02-07: 500 mg via INTRAVENOUS
  Filled 2016-02-07: qty 5

## 2016-02-07 MED ORDER — LEVETIRACETAM 1000 MG PO TABS: 1000.0000 mg | ORAL_TABLET | Freq: Two times a day (BID) | ORAL | 1 refills | Status: DC

## 2016-02-07 MED ORDER — AMLODIPINE BESY-BENAZEPRIL HCL 10-20 MG PO CAPS: 1.0000 | ORAL_CAPSULE | Freq: Every day | ORAL | 0 refills | Status: DC
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
## 2016-02-07 NOTE — ED Provider Notes (Signed)
[facility] DEPT Provider Note   CSN: [id number] Arrival date & time: 02/07/16  0716     History   Chief Complaint Chief Complaint  Patient presents with  . Seizures  . Migraine    HPI Thomas [name] is a 60 y.o. male.  HPI Patient presents to the emergency room for evaluation of seizures. History is obtained from the patient as well as nursing notes.  According to the nursing notes, "Pt reports seizure at work leading to injury and lolss of job and insurance. Pt saw his MD and was unable to afford an 800$ seizure medication. Pt has been "rationing" his seizure medications. Pt also has hx of frontal lobe tumor. Pt reports this past week he has had three seizures, one causing him to fall. Pt reports he does not think they were grand mal. PT reports he has started to have migraines after the seizure. Last seizure was Wednesday." Pt states he thinks he had seizures because he recalls having episodes where he seemed to be confused and lost track of what had just occurred.    No seizure in the last couple of days.  No fevers.  No neck pain.  No injuries. Pt came to the ED because he needs refills of his medications.  He had been taking keppra and that definitely helped but it sounds like vimpat was added for breakthrough seizures.  He also is running out of his BP and DM medications.  He requests refills of those medications. He has not been able to see his PCP for the same  Financial issues.   Past Medical History:  Diagnosis Date  . 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 Diabetes mellitus without complication ([place])   . Epilepsy ([place])   . Hypertension   . Seizures ([place])   . Sleep apnea     Patient Active Problem List   Diagnosis Date Noted  . Trauma   . Diabetes mellitus type II, controlled ([place])   . Essential hypertension   . Aspiration pneumonia ([place])   . Acute encephalopathy 04/09/2014  . Acute ischemic left MCA stroke ([place]) 04/09/2014  . Acute respiratory failure with hypoxia  ([place]) 04/09/2014  . Lactic acidosis 04/09/2014  . Hyperammonemia ([place]) 04/09/2014    Past Surgical History:  Procedure Laterality Date  . CARDIAC CATHETERIZATION     pt's wife denies cardiac cath  . NO PAST SURGERIES         Home Medications    Prior to Admission medications   Medication Sig Start Date End Date Taking? Authorizing Provider  acetaminophen (TYLENOL) 500 MG tablet Take 500 mg by mouth every 6 (six) hours as needed (pain).    Historical Provider, MD  albuterol (PROVENTIL HFA;VENTOLIN HFA) 108 (90 BASE) MCG/ACT inhaler Inhale 2 puffs into the lungs every 4 (four) hours as needed for wheezing or shortness of breath. 04/09/15   Hannah Muthersbaugh, PA-C  amLODipine-benazepril (LOTREL) 10-20 MG capsule Take 1 capsule by mouth daily. 09/24/15   Clayton Bibles, PA-C  amLODipine-benazepril (LOTREL) 10-20 MG capsule Take 1 capsule by mouth daily. 11/10/15   Allie Bossier, MD  amLODipine-benazepril (LOTREL) 10-20 MG capsule Take 1 capsule by mouth daily. 01/05/16   Chapman Moss, MD  furosemide (LASIX) 40 MG tablet Take 1 tablet (40 mg total) by mouth daily. 09/24/15   Clayton Bibles, PA-C  furosemide (LASIX) 40 MG tablet Take 1 tablet (40 mg total) by mouth daily. 11/10/15   Allie Bossier, MD  furosemide (LASIX) 40  MG tablet Take 1 tablet (40 mg total) by mouth daily. 01/05/16   Chapman Moss, MD  HYDROcodone-acetaminophen (NORCO/VICODIN) 5-325 MG tablet Take 1-2 tablets every 6 hours as needed for severe pain Patient not taking: Reported on 08/30/2015 04/16/15   Carlisle Cater, PA-C  ibuprofen (ADVIL,MOTRIN) 200 MG tablet Take 800 mg by mouth every 6 (six) hours as needed for headache.    Historical Provider, MD  Lacosamide (VIMPAT) 100 MG TABS Take 1 tablet (100 mg total) by mouth 2 (two) times daily. 01/05/16   Chapman Moss, MD  levETIRAcetam (KEPPRA) 1000 MG tablet Take 1 tablet (1,000 mg total) by mouth 2 (two) times daily. Patient taking differently: Take 1,000 mg by mouth at bedtime.  06/14/15    Merrily Pew, MD  levETIRAcetam (KEPPRA) 1000 MG tablet Take 1 tablet (1,000 mg total) by mouth 2 (two) times daily. 11/10/15   Allie Bossier, MD  levETIRAcetam (KEPPRA) 1000 MG tablet Take 1,000 mg by mouth 2 (two) times daily.    Historical Provider, MD  metFORMIN (GLUCOPHAGE) 500 MG tablet Take 1 tablet (500 mg total) by mouth 2 (two) times daily with a meal. 09/24/15   Clayton Bibles, PA-C  metFORMIN (GLUCOPHAGE) 500 MG tablet Take 1 tablet (500 mg total) by mouth 2 (two) times daily with a meal. 11/10/15   Allie Bossier, MD  metFORMIN (GLUCOPHAGE) 500 MG tablet Take 1 tablet (500 mg total) by mouth 2 (two) times daily. 01/05/16   Chapman Moss, MD  UNABLE TO FIND Herbal Life Tea: Mix 1 tablespoonful into 2 cups of water and drink three times a day    Historical Provider, MD  UNABLE TO FIND Protein Powder: Mix into a shake and drink once a day    Historical Provider, MD    Family History Family History  Problem Relation Age of Onset  . Family history unknown: Yes    Social History Social History  Substance Use Topics  . Smoking status: Former Research scientist (life sciences)  . Smokeless tobacco: Never Used  . Alcohol use No     Allergies   Review of patient's allergies indicates no known allergies.   Review of Systems Review of Systems  All other systems reviewed and are negative.    Physical Exam Updated Vital Signs BP 148/80   Pulse 74   Temp 97.9 F (36.6 C) (Oral)   Resp 20   Ht 5\' 8"  (1.727 m)   Wt 135.6 kg   SpO2 97%   BMI 45.46 kg/m   Physical Exam  Constitutional: He is oriented to person, place, and time. He appears well-developed and well-nourished. No distress.  HENT:  Head: Normocephalic and atraumatic.  Right Ear: External ear normal.  Left Ear: External ear normal.  Eyes: Conjunctivae are normal. Right eye exhibits no discharge. Left eye exhibits no discharge. No scleral icterus.  Neck: Neck supple. No tracheal deviation present.  Cardiovascular: Normal rate, regular rhythm,  normal heart sounds and intact distal pulses.   Pulmonary/Chest: Effort normal and breath sounds normal. No stridor. No respiratory distress. He has no wheezes. He has no rales.  Abdominal: Soft. Bowel sounds are normal. He exhibits no distension. There is no tenderness. There is no rebound and no guarding.  Musculoskeletal: He exhibits no edema or tenderness.  Neurological: He is alert and oriented to person, place, and time. He has normal strength. No cranial nerve deficit (no facial droop, extraocular movements intact, no slurred speech) or sensory deficit. He exhibits normal muscle tone. He displays no seizure  activity. Coordination normal.  Skin: Skin is warm and dry. No rash noted.  Psychiatric: He has a normal mood and affect.  Nursing note and vitals reviewed.    ED Treatments / Results   Procedures Procedures (including critical care time)  Medications Ordered in ED Medications  levETIRAcetam (KEPPRA) 500 mg in sodium chloride 0.9 % 100 mL IVPB (not administered)  prochlorperazine (COMPAZINE) injection 10 mg (not administered)     Initial Impression / Assessment and Plan / ED Course  I have reviewed the triage vital signs and the nursing notes.  Pertinent labs & imaging results that were available during my care of the patient were reviewed by me and considered in my medical decision making (see chart for details).  Clinical Course    Pt has not been able to take his vimpat because of the cost.  Pt is not having any acute issues at this time.  I will give him a dose of iv keppra and will refill his keppra, BP, and DM medications.  I encouraged follow up with his PCP or consider the [facility] if his insurance issues continue to be a problem.  We discussed the importance of PCP care.  Final Clinical Impressions(s) / ED Diagnoses   Final diagnoses:  Seizure disorder [facility])  Medication refill    New Prescriptions New Prescriptions   No medications  on file     Dorie Rank, MD 02/07/16 [phone]

## 2016-04-06 ENCOUNTER — Emergency Department (HOSPITAL_COMMUNITY)
Admission: EM | Admit: 2016-04-06 | Discharge: 2016-04-06 | Disposition: A | Discharge status: Home / Self Care | Payer: Medicaid Other | Attending: Emergency Medicine | Admitting: Emergency Medicine

## 2016-04-06 ENCOUNTER — Encounter (HOSPITAL_COMMUNITY): Payer: Self-pay

## 2016-04-06 ENCOUNTER — Emergency Department (HOSPITAL_COMMUNITY): Payer: Medicaid Other

## 2016-04-06 DIAGNOSIS — R0789 Other chest pain: Secondary | ICD-10-CM | POA: Diagnosis not present

## 2016-04-06 DIAGNOSIS — Z7984 Long term (current) use of oral hypoglycemic drugs: Secondary | ICD-10-CM | POA: Diagnosis not present

## 2016-04-06 DIAGNOSIS — I10 Essential (primary) hypertension: Secondary | ICD-10-CM | POA: Diagnosis not present

## 2016-04-06 DIAGNOSIS — R569 Unspecified convulsions: Secondary | ICD-10-CM

## 2016-04-06 DIAGNOSIS — Z87891 Personal history of nicotine dependence: Secondary | ICD-10-CM | POA: Insufficient documentation

## 2016-04-06 DIAGNOSIS — R202 Paresthesia of skin: Secondary | ICD-10-CM | POA: Insufficient documentation

## 2016-04-06 DIAGNOSIS — E1140 Type 2 diabetes mellitus with diabetic neuropathy, unspecified: Secondary | ICD-10-CM | POA: Insufficient documentation

## 2016-04-06 DIAGNOSIS — Z79899 Other long term (current) drug therapy: Secondary | ICD-10-CM | POA: Diagnosis not present

## 2016-04-06 DIAGNOSIS — R200 Anesthesia of skin: Secondary | ICD-10-CM | POA: Diagnosis present

## 2016-04-06 DIAGNOSIS — G629 Polyneuropathy, unspecified: Secondary | ICD-10-CM

## 2016-04-06 LAB — CBC
HCT: 38.2 % — ABNORMAL LOW (ref 39.0–52.0)
Hemoglobin: 13.6 g/dL (ref 13.0–17.0)
MCH: 26.2 pg (ref 26.0–34.0)
MCHC: 35.6 g/dL (ref 30.0–36.0)
MCV: 73.5 fL — ABNORMAL LOW (ref 78.0–100.0)
PLATELETS: 279 10*3/uL (ref 150–400)
RBC: 5.2 MIL/uL (ref 4.22–5.81)
RDW: 14.5 % (ref 11.5–15.5)
WBC: 11.5 10*3/uL — ABNORMAL HIGH (ref 4.0–10.5)

## 2016-04-06 LAB — I-STAT TROPONIN, ED: TROPONIN I, POC: 0 ng/mL (ref 0.00–0.08)

## 2016-04-06 LAB — BASIC METABOLIC PANEL
Anion gap: 10 (ref 5–15)
BUN: 12 mg/dL (ref 6–20)
CALCIUM: 9 mg/dL (ref 8.9–10.3)
CO2: 25 mmol/L (ref 22–32)
CREATININE: 1.08 mg/dL (ref 0.61–1.24)
Chloride: 102 mmol/L (ref 101–111)
GFR calc non Af Amer: >60 mL/min (ref >60)
Glucose, Bld: 138 mg/dL — ABNORMAL HIGH (ref 65–99)
Potassium: 3.8 mmol/L (ref 3.5–5.1)
Sodium: 137 mmol/L (ref 135–145)

## 2016-04-06 MED ORDER — LEVETIRACETAM 1000 MG PO TABS
1000.0000 mg | ORAL_TABLET | Freq: Two times a day (BID) | ORAL | 3 refills | Status: DC
Start: 2016-04-06 — End: 2016-05-25

## 2016-04-06 MED ORDER — METFORMIN HCL 500 MG PO TABS: 500.0000 mg | ORAL_TABLET | Freq: Two times a day (BID) | ORAL | 3 refills | Status: DC

## 2016-04-06 MED ORDER — FUROSEMIDE 20 MG PO TABS: 40.0000 mg | ORAL_TABLET | Freq: Every day | ORAL | 3 refills | Status: DC

## 2016-04-06 MED ORDER — LACOSAMIDE 100 MG PO TABS: 100.0000 mg | ORAL_TABLET | Freq: Two times a day (BID) | ORAL | 2 refills | Status: DC

## 2016-04-06 MED ORDER — AMLODIPINE BESY-BENAZEPRIL HCL 10-20 MG PO CAPS: 1.0000 | ORAL_CAPSULE | Freq: Every day | ORAL | 3 refills | Status: DC

## 2016-04-06 MED ORDER — GABAPENTIN 300 MG PO CAPS: 300.0000 mg | ORAL_CAPSULE | Freq: Three times a day (TID) | ORAL | 1 refills | Status: DC

## 2016-04-06 NOTE — ED Triage Notes (Signed)
Pt from home with intermittent chest pain over the last week, numbness in bilateral feet, and seizures on and off. Pt states he is out of medications and believes that is why he is having problems. Pt current not having SOB or CP

## 2016-04-06 NOTE — ED Notes (Signed)
Patient transported to X-ray 

## 2016-04-06 NOTE — ED Provider Notes (Signed)
[facility] DEPT Provider Note   CSN: [id number] Arrival date & time: 04/06/16  8563     History   Chief Complaint Chief Complaint  Patient presents with  . Chest Pain  . Seizures  . Numbness    feet    HPI Thomas [name] is a 60 y.o. male.  HPI 60 year old male with a hx of diabetes, hypertension, and epilepsy presents to the ED complaining of bilateral lower extremity pain similar to his neuropathy for the past 4 days. Patient relays that he is having constant sharp/tingling pain in his feet radiating to his lower calf region, right worse than left. Reports that the pain is a 5/10 in severity that improves with drinking vinegar and worsens with ambulating. Relays that he has a history of similar pain which has been relieved by taking B12 in the past.   On further questioning patient also reports intermittent non-radiating central chest pressure that occurs for variable periods of time. Relays 1 episode was during exertion, others have been following eating, or in the morning with nasal congestion. Denies dyspnea, nausea, vomiting, diaphoresis or swelling/erythema to lower extremities.   Additionally patient is requesting a refill on multiple medications including Keppra, Lasix, Metformin, and Amlodipine, state that he has not had these medications for the past 2 weeks. With lack of medications reports three "mild seizures" which he describes as "going out for awhile and coming to", states these are not as significant as previous gran mal seizures. Currently applying for an orange card in order to get set up with a PCP, process pending.     Past Medical History:  Diagnosis Date  . 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 Diabetes mellitus without complication ([place])   . Epilepsy ([place])   . Hypertension   . Seizures ([place])   . Sleep apnea     Patient Active Problem List   Diagnosis Date Noted  . Trauma   . Diabetes mellitus type II, controlled ([place])   . Essential hypertension   .  Aspiration pneumonia ([place])   . Acute encephalopathy 04/09/2014  . Acute ischemic left MCA stroke ([place]) 04/09/2014  . Acute respiratory failure with hypoxia ([place]) 04/09/2014  . Lactic acidosis 04/09/2014  . Hyperammonemia ([place]) 04/09/2014    Past Surgical History:  Procedure Laterality Date  . CARDIAC CATHETERIZATION     pt's wife denies cardiac cath  . NO PAST SURGERIES         Home Medications    Prior to Admission medications   Medication Sig Start Date End Date Taking? Authorizing Provider  acetaminophen (TYLENOL) 500 MG tablet Take 500 mg by mouth every 6 (six) hours as needed (pain).    Historical Provider, MD  albuterol (PROVENTIL HFA;VENTOLIN HFA) 108 (90 BASE) MCG/ACT inhaler Inhale 2 puffs into the lungs every 4 (four) hours as needed for wheezing or shortness of breath. 04/09/15   Hannah Muthersbaugh, PA-C  amLODipine-benazepril (LOTREL) 10-20 MG capsule Take 1 capsule by mouth daily. 04/06/16   Varney Biles, MD  furosemide (LASIX) 20 MG tablet Take 2 tablets (40 mg total) by mouth daily. 04/06/16   Varney Biles, MD  furosemide (LASIX) 40 MG tablet Take 1 tablet (40 mg total) by mouth daily. 02/07/16   Dorie Rank, MD  gabapentin (NEURONTIN) 300 MG capsule Take 1 capsule (300 mg total) by mouth 3 (three) times daily. 04/06/16   Varney Biles, MD  HYDROcodone-acetaminophen (NORCO/VICODIN) 5-325 MG tablet Take 1-2 tablets every 6 hours as  needed for severe pain 04/16/15   Carlisle Cater, PA-C  ibuprofen (ADVIL,MOTRIN) 200 MG tablet Take 800 mg by mouth every 6 (six) hours as needed for headache.    Historical Provider, MD  Lacosamide (VIMPAT) 100 MG TABS Take 1 tablet (100 mg total) by mouth 2 (two) times daily. 04/06/16   Varney Biles, MD  levETIRAcetam (KEPPRA) 1000 MG tablet Take 1 tablet (1,000 mg total) by mouth 2 (two) times daily. 04/06/16   Varney Biles, MD  metFORMIN (GLUCOPHAGE) 500 MG tablet Take 1 tablet (500 mg total) by mouth 2 (two) times daily with a meal.  04/06/16   Varney Biles, MD  UNABLE TO FIND Protein Powder: Mix into a shake and drink once a day    Historical Provider, MD    Family History Family History  Problem Relation Age of Onset  . Family history unknown: Yes    Social History Social History  Substance Use Topics  . Smoking status: Former Research scientist (life sciences)  . Smokeless tobacco: Never Used  . Alcohol use No     Allergies   Patient has no known allergies.   Review of Systems Review of Systems  ROS 10 Systems reviewed and are negative for acute change except as noted in the HPI.     Physical Exam Updated Vital Signs BP 168/89   Pulse 90   Temp 97.8 F (36.6 C) (Oral)   Resp 18   Ht 5\' 8"  (1.727 m)   Wt 300 lb (136.1 kg)   SpO2 99%   BMI 45.61 kg/m   Physical Exam  Constitutional: He is oriented to person, place, and time. He appears well-developed.  HENT:  Head: Normocephalic and atraumatic.  Eyes: Conjunctivae and EOM are normal. Pupils are equal, round, and reactive to light.  Neck: Normal range of motion. Neck supple.  Cardiovascular: Normal rate and regular rhythm.   Pulmonary/Chest: Effort normal and breath sounds normal.  Abdominal: Soft. Bowel sounds are normal. He exhibits no distension. There is no tenderness. There is no rebound and no guarding.  Neurological: He is alert and oriented to person, place, and time.  Skin: Skin is warm.  Nursing note and vitals reviewed.    ED Treatments / Results  Labs (all labs ordered are listed, but only abnormal results are displayed) Labs Reviewed  BASIC METABOLIC PANEL - Abnormal; Notable for the following:       Result Value   Glucose, Bld 138 (*)    All other components within normal limits  CBC - Abnormal; Notable for the following:    WBC 11.5 (*)    HCT 38.2 (*)    MCV 73.5 (*)    All other components within normal limits  I-STAT TROPOININ, ED    EKG  EKG Interpretation  Date/Time:  Tuesday April 06 2016 06:33:34 EST Ventricular Rate:   73 PR Interval:    QRS Duration: 91 QT Interval:  408 QTC Calculation: 450 R Axis:   39 Text Interpretation:  Sinus rhythm Borderline T abnormalities, lateral leads No acute changes No significant change since last tracing Confirmed by Kathrynn Humble, MD, Thelma Comp [phone]) on 04/06/2016 7:12:12 AM       Radiology Dg Chest 2 View  Result Date: 04/06/2016 CLINICAL DATA:  Intermittent chest pain. EXAM: CHEST  2 VIEW COMPARISON:  09/24/2015 FINDINGS: Heart and mediastinal contours are within normal limits. No focal opacities or effusions. No acute bony abnormality. IMPRESSION: No active cardiopulmonary disease. Electronically Signed   By: Rolm Baptise M.D.  On: 04/06/2016 07:30    Procedures Procedures (including critical care time)  Medications Ordered in ED Medications - No data to display   Initial Impression / Assessment and Plan / ED Course  I have reviewed the triage vital signs and the nursing notes.  Pertinent labs & imaging results that were available during my care of the patient were reviewed by me and considered in my medical decision making (see chart for details).  Clinical Course     Pt comes in with multiple complains.  Chest pain - atypical, episodic, not exertional, positional or pleuritic. Has cardiac risk factors. Screening ekg and trop ordered. No chest pain currently or in the last day at least.  Pt also has paresthesia. Likely neuropathic.  Finally -will need all meds refilled.  Final Clinical Impressions(s) / ED Diagnoses   Final diagnoses:  Paresthesia  Seizure ([place])  Atypical chest pain  Neuropathy ([place])    New Prescriptions Discharge Medication List as of 04/06/2016  9:02 AM    START taking these medications   Details  gabapentin (NEURONTIN) 300 MG capsule Take 1 capsule (300 mg total) by mouth 3 (three) times daily., Starting Tue 04/06/2016, Print         Varney Biles, MD 04/06/16 1517

## 2016-04-06 NOTE — ED Notes (Signed)
Pt returned from X-ray.  

## 2016-05-25 ENCOUNTER — Emergency Department (HOSPITAL_COMMUNITY)
Admission: EM | Admit: 2016-05-25 | Discharge: 2016-05-25 | Disposition: A | Discharge status: Home / Self Care | Payer: Medicaid Other | Attending: Emergency Medicine | Admitting: Emergency Medicine

## 2016-05-25 ENCOUNTER — Emergency Department (HOSPITAL_COMMUNITY): Payer: Medicaid Other

## 2016-05-25 ENCOUNTER — Encounter (HOSPITAL_COMMUNITY): Payer: Self-pay | Admitting: Emergency Medicine

## 2016-05-25 DIAGNOSIS — S00512A Abrasion of oral cavity, initial encounter: Secondary | ICD-10-CM | POA: Diagnosis not present

## 2016-05-25 DIAGNOSIS — R569 Unspecified convulsions: Secondary | ICD-10-CM | POA: Insufficient documentation

## 2016-05-25 DIAGNOSIS — I10 Essential (primary) hypertension: Secondary | ICD-10-CM | POA: Insufficient documentation

## 2016-05-25 DIAGNOSIS — Z7984 Long term (current) use of oral hypoglycemic drugs: Secondary | ICD-10-CM | POA: Insufficient documentation

## 2016-05-25 DIAGNOSIS — S0993XA Unspecified injury of face, initial encounter: Secondary | ICD-10-CM | POA: Diagnosis present

## 2016-05-25 DIAGNOSIS — Y999 Unspecified external cause status: Secondary | ICD-10-CM | POA: Diagnosis not present

## 2016-05-25 DIAGNOSIS — X58XXXA Exposure to other specified factors, initial encounter: Secondary | ICD-10-CM | POA: Diagnosis not present

## 2016-05-25 DIAGNOSIS — E119 Type 2 diabetes mellitus without complications: Secondary | ICD-10-CM | POA: Diagnosis not present

## 2016-05-25 DIAGNOSIS — R4182 Altered mental status, unspecified: Secondary | ICD-10-CM | POA: Insufficient documentation

## 2016-05-25 DIAGNOSIS — Y92009 Unspecified place in unspecified non-institutional (private) residence as the place of occurrence of the external cause: Secondary | ICD-10-CM | POA: Insufficient documentation

## 2016-05-25 DIAGNOSIS — Y939 Activity, unspecified: Secondary | ICD-10-CM | POA: Diagnosis not present

## 2016-05-25 DIAGNOSIS — G40919 Epilepsy, unspecified, intractable, without status epilepticus: Secondary | ICD-10-CM

## 2016-05-25 LAB — BASIC METABOLIC PANEL
ANION GAP: 15 (ref 5–15)
BUN: 17 mg/dL (ref 6–20)
CO2: 18 mmol/L — ABNORMAL LOW (ref 22–32)
Calcium: 8.5 mg/dL — ABNORMAL LOW (ref 8.9–10.3)
Chloride: 98 mmol/L — ABNORMAL LOW (ref 101–111)
Creatinine, Ser: 1.2 mg/dL (ref 0.61–1.24)
Glucose, Bld: 258 mg/dL — ABNORMAL HIGH (ref 65–99)
POTASSIUM: 3.6 mmol/L (ref 3.5–5.1)
SODIUM: 131 mmol/L — AB (ref 135–145)

## 2016-05-25 LAB — ETHANOL: Alcohol, Ethyl (B): <5 mg/dL (ref <5)

## 2016-05-25 LAB — CBC
HCT: 39.6 % (ref 39.0–52.0)
Hemoglobin: 14 g/dL (ref 13.0–17.0)
MCH: 25.9 pg — AB (ref 26.0–34.0)
MCHC: 35.4 g/dL (ref 30.0–36.0)
MCV: 73.2 fL — ABNORMAL LOW (ref 78.0–100.0)
Platelets: 301 10*3/uL (ref 150–400)
RBC: 5.41 MIL/uL (ref 4.22–5.81)
RDW: 14.9 % (ref 11.5–15.5)
WBC: 19.8 10*3/uL — AB (ref 4.0–10.5)

## 2016-05-25 MED ORDER — HALOPERIDOL LACTATE 5 MG/ML IJ SOLN
5.0000 mg | Freq: Once | INTRAMUSCULAR | Status: AC
  Administered 2016-05-25: 5 mg via INTRAMUSCULAR

## 2016-05-25 MED ORDER — LORAZEPAM 2 MG/ML IJ SOLN
1.0000 mg | Freq: Once | INTRAMUSCULAR | Status: AC
  Administered 2016-05-25: 1 mg via INTRAVENOUS
  Filled 2016-05-25: qty 1

## 2016-05-25 MED ORDER — LEVETIRACETAM 500 MG PO TABS: 1500.0000 mg | ORAL_TABLET | Freq: Two times a day (BID) | ORAL | 0 refills | Status: DC

## 2016-05-25 MED ORDER — SODIUM CHLORIDE 0.9 % IV BOLUS (SEPSIS)
1000.0000 mL | Freq: Once | INTRAVENOUS | Status: AC
  Administered 2016-05-25: 1000 mL via INTRAVENOUS

## 2016-05-25 MED ORDER — LORAZEPAM 2 MG/ML IJ SOLN
1.0000 mg | Freq: Once | INTRAMUSCULAR | Status: AC
  Administered 2016-05-25: 1 mg via INTRAVENOUS

## 2016-05-25 MED ORDER — LORAZEPAM 2 MG/ML IJ SOLN
INTRAMUSCULAR | Status: AC
  Filled 2016-05-25: qty 1

## 2016-05-25 MED ORDER — SODIUM CHLORIDE 0.9 % IV SOLN
1000.0000 mg | Freq: Once | INTRAVENOUS | Status: AC
  Administered 2016-05-25: 1000 mg via INTRAVENOUS
  Filled 2016-05-25 (×2): qty 10

## 2016-05-25 NOTE — ED Notes (Signed)
Family at bedside. 

## 2016-05-25 NOTE — ED Notes (Signed)
Patient is still in soft restraints patient is resting

## 2016-05-25 NOTE — ED Notes (Signed)
Pt returned from CT and connected to the monitor 

## 2016-05-25 NOTE — ED Notes (Signed)
Patient had soft restraints put on patient is still hollering out

## 2016-05-25 NOTE — ED Notes (Signed)
ED Provider at bedside. 

## 2016-05-25 NOTE — ED Triage Notes (Signed)
Pt in from home via [facility] after 15 min seizure at home. Pt has hx of seizures, family states he is compliant with all meds. Per EMS, the pt had a second, 10 min seizure, EMS gave 2.5 of Versed. Per EMS, pt frequently has violent post-ictal states. Pt arrives thrashing in bed, no vocalizing. Bite marks on tongue, incontinence noted. Sats 94% on RA

## 2016-05-25 NOTE — ED Provider Notes (Signed)
[name] DEPT Provider Note   CSN: [id number] Arrival date & time: 05/25/16  1407     History   Chief Complaint Chief Complaint  Patient presents with  . Seizures    HPI Thomas [name] is a 61 y.o. male.  HPI 61 year old male with history of seizure disorder here with witnessed generalized seizure. Patient has reportedly had a "cold" for the last several days. He did fall 3 days ago and hit his head but is not on blood thinner. There was no loss of consciousness. Earlier today, the patient's wife heard him yell then heard a loud noise in the bedroom. He was found having a generalized tonic-clonic seizure. According to the wife, this lasted approximately 15 minutes. He bit his tongue and was incontinent of urine. This is not abnormal for him during his seizures. She does not feel that he has missed any of his medications. On EMS arrival, patient was postictal. He was given Versed due to agitation. According to wife, the patient is frequently agitated after seizures and has required multiple doses of Haldol and Ativan in the past. Currently, patient is altered and unable to provide further history. He is agitated but redirectable.   Past Medical History:  Diagnosis Date  . 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 Diabetes mellitus without complication ([place])   . Epilepsy ([place])   . Hypertension   . Seizures ([place])   . Sleep apnea     Patient Active Problem List   Diagnosis Date Noted  . Trauma   . Diabetes mellitus type II, controlled ([place])   . Essential hypertension   . Aspiration pneumonia ([place])   . Acute encephalopathy 04/09/2014  . Acute ischemic left MCA stroke ([place]) 04/09/2014  . Acute respiratory failure with hypoxia ([place]) 04/09/2014  . Lactic acidosis 04/09/2014  . Hyperammonemia ([name]) 04/09/2014    Past Surgical History:  Procedure Laterality Date  . CARDIAC CATHETERIZATION     pt's wife denies cardiac cath  . NO PAST SURGERIES         Home Medications     Prior to Admission medications   Medication Sig Start Date End Date Taking? Authorizing Provider  acetaminophen (TYLENOL) 500 MG tablet Take 500 mg by mouth every 6 (six) hours as needed (pain).   Yes Historical Provider, MD  amLODipine-benazepril (LOTREL) 10-20 MG capsule Take 1 capsule by mouth daily. 04/06/16  Yes Varney Biles, MD  gabapentin (NEURONTIN) 300 MG capsule Take 1 capsule (300 mg total) by mouth 3 (three) times daily. 04/06/16  Yes Varney Biles, MD  metFORMIN (GLUCOPHAGE) 500 MG tablet Take 1 tablet (500 mg total) by mouth 2 (two) times daily with a meal. 04/06/16  Yes Ankit Nanavati, MD  UNABLE TO FIND Herbal Life Protein Powder: Mix into a shake and drink once a day   Yes Historical Provider, MD  albuterol (PROVENTIL HFA;VENTOLIN HFA) 108 (90 BASE) MCG/ACT inhaler Inhale 2 puffs into the lungs every 4 (four) hours as needed for wheezing or shortness of breath. 04/09/15   Hannah Muthersbaugh, PA-C  furosemide (LASIX) 20 MG tablet Take 2 tablets (40 mg total) by mouth daily. Patient not taking: Reported on 05/25/2016 04/06/16   Varney Biles, MD  furosemide (LASIX) 40 MG tablet Take 1 tablet (40 mg total) by mouth daily. Patient not taking: Reported on 05/25/2016 02/07/16   Dorie Rank, MD  HYDROcodone-acetaminophen (NORCO/VICODIN) 5-325 MG tablet Take 1-2 tablets every 6 hours as needed for severe pain Patient not taking: Reported  on 05/25/2016 04/16/15   Carlisle Cater, PA-C  Lacosamide (VIMPAT) 100 MG TABS Take 1 tablet (100 mg total) by mouth 2 (two) times daily. Patient not taking: Reported on 05/25/2016 04/06/16   Varney Biles, MD  levETIRAcetam (KEPPRA) 500 MG tablet Take 3 tablets (1,500 mg total) by mouth 2 (two) times daily. 05/25/16 06/24/16  Duffy Bruce, MD    Family History Family History  Problem Relation Age of Onset  . Family history unknown: Yes    Social History Social History  Substance Use Topics  . Smoking status: Former Research scientist (life sciences)  . Smokeless tobacco:  Never Used  . Alcohol use No     Allergies   Patient has no known allergies.   Review of Systems Review of Systems  Unable to perform ROS: Mental status change     Physical Exam Updated Vital Signs BP 135/84 (BP Location: Right Arm)   Pulse 102   Resp 22   Ht 5\' 9"  (1.753 m)   Wt 300 lb (136.1 kg)   SpO2 93%   BMI 44.30 kg/m   Physical Exam  Constitutional: He appears well-developed and well-nourished. He appears distressed.  HENT:  Head: Normocephalic and atraumatic.  Mild abrasion to right lateral tongue. No deep lacerations  Eyes: Conjunctivae are normal.  Neck: Neck supple.  Cardiovascular: Normal rate, regular rhythm and normal heart sounds.  Exam reveals no friction rub.   No murmur heard. Pulmonary/Chest: Effort normal and breath sounds normal. No respiratory distress. He has no wheezes. He has no rales.  Abdominal: Soft. He exhibits no distension. There is no tenderness.  Musculoskeletal: He exhibits no edema.  Neurological: He is alert. He exhibits normal muscle tone.  Oriented to person only. No apparent CN deficit. Repetitively saying "no, mama" but able to answer questions. MAE with 5/5 strength.  Skin: Skin is warm. Capillary refill takes less than 2 seconds.  Psychiatric: He has a normal mood and affect.  Nursing note and vitals reviewed.    ED Treatments / Results  Labs (all labs ordered are listed, but only abnormal results are displayed) Labs Reviewed  CBC - Abnormal; Notable for the following:       Result Value   WBC 19.8 (*)    MCV 73.2 (*)    MCH 25.9 (*)    All other components within normal limits  BASIC METABOLIC PANEL - Abnormal; Notable for the following:    Sodium 131 (*)    Chloride 98 (*)    CO2 18 (*)    Glucose, Bld 258 (*)    Calcium 8.5 (*)    All other components within normal limits  ETHANOL    EKG  EKG Interpretation None       Radiology Ct Head Wo Contrast  Result Date: 05/25/2016 CLINICAL DATA:  History  of meningioma.  Presents with seizures x2. EXAM: CT HEAD WITHOUT CONTRAST TECHNIQUE: Contiguous axial images were obtained from the base of the skull through the vertex without intravenous contrast. COMPARISON:  CT exams dating back through 04/09/2015. FINDINGS: Brain: Unchanged left frontal lateral convexity extra-axial mass consistent known meningioma with underlying vasogenic edema. No acute intracranial hemorrhage, midline shift or extra-axial fluid collections. No hydrocephalus. Vascular: No hyperdense vasculature. Skull:  No fracture or bone destruction. Sinuses/Orbits: Clear mastoids. Mucous retention cyst noted of the right maxillary sinus with minimal bilateral maxillary and ethmoid sinus as well as mild-to-moderate sphenoid sinus mucosal thickening. The frontal sinus appears clear. Other: No significant soft tissue swelling of the  scalp. IMPRESSION: Chronic stable appearing left frontal convexity meningioma given limitations of a noncontrast exam. The underlying vasogenic edema is stable. No intracranial abnormality. Mild paranasal chronic sinusitis. Mucous retention cyst of the right maxillary sinus. Electronically Signed   By: Ashley Royalty M.D.   On: 05/25/2016 18:46    Procedures Procedures (including critical care time)  Medications Ordered in ED Medications  LORazepam (ATIVAN) injection 1 mg (1 mg Intravenous Given 05/25/16 1435)  LORazepam (ATIVAN) injection 1 mg (1 mg Intravenous Given 05/25/16 1443)  haloperidol lactate (HALDOL) injection 5 mg (5 mg Intramuscular Given 05/25/16 1451)  LORazepam (ATIVAN) injection 1 mg (1 mg Intravenous Given 05/25/16 1523)  sodium chloride 0.9 % bolus 1,000 mL (0 mLs Intravenous Stopped 05/25/16 1805)  levETIRAcetam (KEPPRA) 1,000 mg in sodium chloride 0.9 % 100 mL IVPB (0 mg Intravenous Stopped 05/25/16 2014)     Initial Impression / Assessment and Plan / ED Course  I have reviewed the triage vital signs and the nursing notes.  Pertinent labs &  imaging results that were available during my care of the patient were reviewed by me and considered in my medical decision making (see chart for details).  Clinical Course     61 yo M with PMHx of seizure disorder here with breakthrough seizure. Unclear trigger, possibly non-adherence. On arrival, pt postictal. Labs, imaging obtained 2/2 AMS, unknown trigger and are unremarkable. CT head negative. Labs show moderate leukocytosis, dehydration likely 2/2 recent seizure. Pt was given IVF, keppra load and HR, VS returned to normal while in ED. D/w Dr. Birder Robson of neurology - will increase Keppra to 1500 BID, d/c home. Pt now back to baseline, well appearing, ambulatory, and in agreement. He will call his neurologist for follow-up.  Final Clinical Impressions(s) / ED Diagnoses   Final diagnoses:  Seizure [facility])  Breakthrough seizure [facility])    New Prescriptions Discharge Medication List as of 05/25/2016 10:07 PM       Duffy Bruce, MD 05/25/16 2355

## 2016-05-25 NOTE — ED Notes (Signed)
Patient is still in restraints

## 2016-05-25 NOTE — ED Notes (Signed)
Pt ambulatory in the hall with [place] with a steady gait. Pt stated he still felt groggy from his seizure but he was okay. Pt ambulated to the restroom and back, about 100 feet.

## 2016-05-25 NOTE — ED Notes (Signed)
Patient is resting still in the soft restraints

## 2016-05-25 NOTE — ED Notes (Signed)
Unable to get a blood pressure on patient

## 2016-05-25 NOTE — ED Notes (Signed)
PT is combative and yelling and attempting to get out of the bed. MD informed

## 2016-05-25 NOTE — ED Notes (Signed)
Assisted nurse and tech in placing restraints.

## 2016-05-25 NOTE — ED Notes (Signed)
Patient transported to CT 

## 2016-07-29 ENCOUNTER — Encounter: Payer: Medicaid Other | Attending: Internal Medicine | Admitting: Registered"

## 2016-07-29 DIAGNOSIS — E119 Type 2 diabetes mellitus without complications: Secondary | ICD-10-CM | POA: Insufficient documentation

## 2016-07-29 DIAGNOSIS — E118 Type 2 diabetes mellitus with unspecified complications: Secondary | ICD-10-CM

## 2016-07-29 DIAGNOSIS — Z713 Dietary counseling and surveillance: Secondary | ICD-10-CM | POA: Insufficient documentation

## 2016-07-29 NOTE — Patient Instructions (Addendum)
Consider getting a dilated eye exam, let optometrist know you have diabetes. Plan:  Avoid skipping meals Aim for 3-4 Carb Choices per meal (45-60 grams)  Aim for 0-2 Carbs per snack if hungry  Include protein in moderation with your meals and snacks Consider cutting back or eliminating the ginger ale Consider reading food labels for Total Carbohydrate  Consider checking BG a few times over the next few weeks fasting and 2 hrs after a meal Continue taking medication as directed by MD

## 2016-07-29 NOTE — Progress Notes (Signed)
Diabetes Self-Management Education  Visit Type: First/Initial  Appt. Start Time: 1430 Appt. End Time: 2725  07/29/2016  [name], identified by name and date of birth, is a 61 y.o. male with a diagnosis of Diabetes: Type 2.   It appears patient A1C result of 6.5 included with referral is from 2015. From patient's stated symptoms of thirst and spontaneous blurred vision, RD suspects his A1C may be higher than 6.5 now. Patient is very motivated to make lifestyle changes that will most likely help control BG.  ASSESSMENT Patient reports he needs to change his medication routine with Keppra, it makes him very sleepy so he only takes it at night instead of 2x a day but he is going to request Vimpat which is supposed to be better seizure medication. Patient states he is anticipating have surgery soon to remove a tumor in his brain which is causing his seizures as well as horrendous headaches.   Patient states his metformin medication has been updated (RD saw Rx bottle and there is an ER in the medication name which is probably now extended released). Patient states he takes gabapentin as needed for pins & needles sensations in his feet.   Patient reports he feels much better when he is eating healthy and getting regular physical activity which he and his family tend to do more in the summer months.      Diabetes Self-Management Education - 07/29/16 1429      Visit Information   Visit Type First/Initial     Initial Visit   Diabetes Type Type 2   Are you currently following a meal plan? No   Are you taking your medications as prescribed? Yes   Date Diagnosed 3 yrs ago     Health Coping   How would you rate your overall health? Very Poor     Psychosocial Assessment   Patient Belief/Attitude about Diabetes Motivated to manage diabetes   How often do you need to have someone help you when you read instructions, pamphlets, or other written materials from your doctor or pharmacy? 3 -  Sometimes  due to vision   What is the last grade level you completed in school? 12 (2 yrs of college)     Complications   Last HgB A1C per patient/outside source 6.5 %  on referral paper work looks like 2015 was last test   How often do you check your blood sugar? 0 times/day (not testing)   Have you had a dilated eye exam in the past 12 months? No   Have you had a dental exam in the past 12 months? No   Are you checking your feet? No     Dietary Intake   Breakfast oatmeal OR protein shake, tea to boost metabolism 80% time OR eggs, bacon grits   Snack (morning) fruit (loves) OR trailmix OR occassionally Hershey bar   Lunch chicken salad (something light) OR ginger ale and nuts   Snack (afternoon) sour cream & onion potato chips and cheese stick   Dinner [employer] chicken tenders, fries, [place] toast OR salads, grilled chicken mostly in the summer   Snack (evening) random things to have food with keppra med at 10 pm   Beverage(s) water, 3 cans ginger ale, OJ, coffee with truvia and cream     Exercise   Exercise Type ADL's  when weather gets better will get back to walking 3 mi day in the park   How many days per week to you  exercise? 0   How many minutes per day do you exercise? 0   Total minutes per week of exercise 0     Patient Education   Previous Diabetes Education No   Disease state  Definition of diabetes, type 1 and 2, and the diagnosis of diabetes;Factors that contribute to the development of diabetes   Nutrition management  Role of diet in the treatment of diabetes and the relationship between the three main macronutrients and blood glucose level;Carbohydrate counting   Monitoring Identified appropriate SMBG and/or A1C goals.   Acute complications Taught treatment of hypoglycemia - the 15 rule.   Chronic complications Relationship between chronic complications and blood glucose control;Assessed and discussed foot care and prevention of foot problems;Lipid levels, blood  glucose control and heart disease;Identified and discussed with patient  current chronic complications;Retinopathy and reason for yearly dilated eye exams   Psychosocial adjustment Role of stress on diabetes     Individualized Goals (developed by patient)   Nutrition Follow meal plan discussed     Outcomes   Expected Outcomes Demonstrated interest in learning. Expect positive outcomes   Future DMSE 4-6 wks   Program Status Not Completed      Individualized Plan for Diabetes Self-Management Training:   Learning Objective:  Patient will have a greater understanding of diabetes self-management. Patient education plan is to attend individual and/or group sessions per assessed needs and concerns.   Plan:   Patient Instructions  Consider getting a dilated eye exam, let optometrist know you have diabetes. Plan:  Avoid skipping meals Aim for 3-4 Carb Choices per meal (45-60 grams)  Aim for 0-2 Carbs per snack if hungry  Include protein in moderation with your meals and snacks Consider cutting back or eliminating the ginger ale Consider reading food labels for Total Carbohydrate  Consider checking BG a few times over the next few weeks fasting and 2 hrs after a meal Continue taking medication as directed by MD   Return in 3-4 weeks to continue diabetes education Review carb counting Types of Fat Exericise  Expected Outcomes:  Demonstrated interest in learning. Expect positive outcomes  Education material provided: Living Well with Diabetes, A1C conversion sheet, My Plate, Snack sheet and Carbohydrate counting sheet  If problems or questions, patient to contact team via:  Phone and Email  Future DSME appointment: 4-6 wks

## 2016-08-19 ENCOUNTER — Other Ambulatory Visit: Payer: Self-pay | Admitting: Neurosurgery

## 2016-08-26 ENCOUNTER — Encounter (HOSPITAL_COMMUNITY)
Admission: RE | Admit: 2016-08-26 | Discharge: 2016-08-26 | Disposition: A | Discharge status: Home / Self Care | Payer: Medicaid Other | Source: Ambulatory Visit | Attending: Neurosurgery | Admitting: Neurosurgery

## 2016-08-26 ENCOUNTER — Encounter (HOSPITAL_COMMUNITY): Payer: Self-pay

## 2016-08-26 DIAGNOSIS — Z01812 Encounter for preprocedural laboratory examination: Secondary | ICD-10-CM | POA: Diagnosis not present

## 2016-08-26 DIAGNOSIS — D320 Benign neoplasm of cerebral meninges: Secondary | ICD-10-CM | POA: Diagnosis not present

## 2016-08-26 HISTORY — DX: Headache, unspecified: R51.9

## 2016-08-26 HISTORY — DX: Dyspnea, unspecified: R06.00

## 2016-08-26 HISTORY — DX: Edema, unspecified: R60.9

## 2016-08-26 HISTORY — DX: Localized edema: R60.0

## 2016-08-26 HISTORY — DX: Pneumonia, unspecified organism: J18.9

## 2016-08-26 HISTORY — DX: Polyneuropathy, unspecified: G62.9

## 2016-08-26 HISTORY — DX: Disorientation, unspecified: R41.0

## 2016-08-26 HISTORY — DX: Frequency of micturition: R35.0

## 2016-08-26 HISTORY — DX: Headache: R51

## 2016-08-26 HISTORY — DX: Dizziness and giddiness: R42

## 2016-08-26 HISTORY — DX: Gastro-esophageal reflux disease without esophagitis: K21.9

## 2016-08-26 HISTORY — DX: Hyperlipidemia, unspecified: E78.5

## 2016-08-26 HISTORY — DX: Unspecified asthma, uncomplicated: J45.909

## 2016-08-26 LAB — BASIC METABOLIC PANEL
ANION GAP: 8 (ref 5–15)
BUN: 19 mg/dL (ref 6–20)
CALCIUM: 9.2 mg/dL (ref 8.9–10.3)
CHLORIDE: 97 mmol/L — AB (ref 101–111)
CO2: 28 mmol/L (ref 22–32)
Creatinine, Ser: 1 mg/dL (ref 0.61–1.24)
GFR calc non Af Amer: >60 mL/min (ref >60)
Glucose, Bld: 208 mg/dL — ABNORMAL HIGH (ref 65–99)
Potassium: 4.1 mmol/L (ref 3.5–5.1)
Sodium: 133 mmol/L — ABNORMAL LOW (ref 135–145)

## 2016-08-26 LAB — TYPE AND SCREEN
ABO/RH(D): B POS
Antibody Screen: NEGATIVE

## 2016-08-26 LAB — GLUCOSE, CAPILLARY: GLUCOSE-CAPILLARY: 229 mg/dL — AB (ref 65–99)

## 2016-08-26 LAB — CBC
HCT: 38.4 % — ABNORMAL LOW (ref 39.0–52.0)
HEMOGLOBIN: 13.5 g/dL (ref 13.0–17.0)
MCH: 26 pg (ref 26.0–34.0)
MCHC: 35.2 g/dL (ref 30.0–36.0)
MCV: 74 fL — ABNORMAL LOW (ref 78.0–100.0)
Platelets: 309 10*3/uL (ref 150–400)
RBC: 5.19 MIL/uL (ref 4.22–5.81)
RDW: 14.2 % (ref 11.5–15.5)
WBC: 12.1 10*3/uL — ABNORMAL HIGH (ref 4.0–10.5)

## 2016-08-26 LAB — ABO/RH: ABO/RH(D): B POS

## 2016-08-26 MED ORDER — CHLORHEXIDINE GLUCONATE CLOTH 2 % EX PADS: 6.0000 | MEDICATED_PAD | Freq: Once | CUTANEOUS | Status: DC

## 2016-08-26 NOTE — Pre-Procedure Instructions (Signed)
Thomas [name]  08/26/2016      [facility], [place] [address] [place] [address] Phone: [phone] Fax: [phone]    Your procedure is scheduled on Wed, April 25 @ 2:30 PM  Report to [facility] Admitting at 12:30 PM  Call this number if you have problems the morning of surgery:  [phone]   Remember:  Do not eat food or drink liquids after midnight.  Take these medicines the morning of surgery with A SIP OF WATER Albuterol<Bring Your Inhaler With You>,Amlodipine-Benazepril(Lotrel),Gabapentin(Neurontin),Keppra(Levetiracetam),and Omeprazole(Prilosec)             No Goody's,BC's,Aleve,Advil,Aspirin,Ibuprofen,Motrin,Fish Oil,or any Herbal Medications.      How to Manage Your Diabetes Before and After Surgery  Why is it important to control my blood sugar before and after surgery? . Improving blood sugar levels before and after surgery helps healing and can limit problems. . A way of improving blood sugar control is eating a healthy diet by: o  Eating less sugar and carbohydrates o  Increasing activity/exercise o  Talking with your doctor about reaching your blood sugar goals . High blood sugars (greater than 180 mg/dL) can raise your risk of infections and slow your recovery, so you will need to focus on controlling your diabetes during the weeks before surgery. . Make sure that the doctor who takes care of your diabetes knows about your planned surgery including the date and location.  How do I manage my blood sugar before surgery? . Check your blood sugar at least 4 times a day, starting 2 days before surgery, to make sure that the level is not too high or low. o Check your blood sugar the morning of your surgery when you wake up and every 2 hours until you get to the Short Stay unit. . If your blood sugar is less than 70 mg/dL, you will need to treat for low blood sugar: o Do not  take insulin. o Treat a low blood sugar (less than 70 mg/dL) with  cup of clear juice (cranberry or apple), 4 glucose tablets, OR glucose gel. o Recheck blood sugar in 15 minutes after treatment (to make sure it is greater than 70 mg/dL). If your blood sugar is not greater than 70 mg/dL on recheck, call [phone] for further instructions. . Report your blood sugar to the short stay nurse when you get to Short Stay.  . If you are admitted to the hospital after surgery: o Your blood sugar will be checked by the staff and you will probably be given insulin after surgery (instead of oral diabetes medicines) to make sure you have good blood sugar levels. o The goal for blood sugar control after surgery is 80-180 mg/dL.              WHAT DO I DO ABOUT MY DIABETES MEDICATION?   Marland Kitchen Do not take oral diabetes medicines (pills) the morning of surgery.    Reviewed and Endorsed by [facility] Patient Education Committee, August 2015   Do not wear jewelry, make-up or nail polish.  Do not wear lotions, powders, or perfumes, or deoderant.  Do not shave 48 hours prior to surgery.  Men may shave face and neck.  Do not bring valuables to the hospital.  [facility] is not responsible for any belongings or valuables.  Contacts, dentures or bridgework may not be worn into surgery.  Leave your suitcase  in the car.  After surgery it may be brought to your room.  For patients admitted to the hospital, discharge time will be determined by your treatment team.  Patients discharged the day of surgery will not be allowed to drive home.    [facility]  Before surgery, you can play an important role.  Because skin is not sterile, your skin needs to be as free of germs as possible.  You can reduce the number of germs on you skin by washing with CHG (chlorahexidine gluconate) soap before surgery.  CHG is an antiseptic cleaner which kills germs and bonds with the skin  to continue killing germs even after washing.  Please DO NOT use if you have an allergy to CHG or antibacterial soaps.  If your skin becomes reddened/irritated stop using the CHG and inform your nurse when you arrive at Short Stay.  Do not shave (including legs and underarms) for at least 48 hours prior to the first CHG shower.  You may shave your face.  Please follow these instructions carefully:   1.  Shower with CHG Soap the night before surgery and the                                morning of Surgery.  2.  If you choose to wash your hair, wash your hair first as usual with your       normal shampoo.  3.  After you shampoo, rinse your hair and body thoroughly to remove the                      Shampoo.  4.  Use CHG as you would any other liquid soap.  You can apply chg directly       to the skin and wash gently with scrungie or a clean washcloth.  5.  Apply the CHG Soap to your body ONLY FROM THE NECK DOWN.        Do not use on open wounds or open sores.  Avoid contact with your eyes,       ears, mouth and genitals (private parts).  Wash genitals (private parts)       with your normal soap.  6.  Wash thoroughly, paying special attention to the area where your surgery        will be performed.  7.  Thoroughly rinse your body with warm water from the neck down.  8.  DO NOT shower/wash with your normal soap after using and rinsing off       the CHG Soap.  9.  Pat yourself dry with a clean towel.            10.  Wear clean pajamas.            11.  Place clean sheets on your bed the night of your first shower and do not        sleep with pets.  Day of Surgery  Do not apply any lotions/deoderants the morning of surgery.  Please wear clean clothes to the hospital/surgery center.     Please read over the following fact sheets that you were given. Pain Booklet, Coughing and Deep Breathing, MRSA Information and Surgical Site Infection Prevention

## 2016-08-26 NOTE — Progress Notes (Addendum)
Cardiologist denies having one   Medical Md is Dr.George Osei-Bonsu  Echo/stress test/heart cath 7 yrs ago  EKG in epic from 05-25-16  CXR in epic from 03/2016

## 2016-08-27 LAB — HEMOGLOBIN A1C
HEMOGLOBIN A1C: 8.6 % — AB (ref 4.8–5.6)
Mean Plasma Glucose: 200 mg/dL

## 2016-08-30 ENCOUNTER — Ambulatory Visit: Payer: Self-pay | Admitting: Registered"

## 2016-08-31 MED ORDER — DEXTROSE 5 % IV SOLN
3.0000 g | INTRAVENOUS | Status: AC
  Administered 2016-09-01: 3 g via INTRAVENOUS
  Filled 2016-08-31: qty 3000

## 2016-09-01 ENCOUNTER — Inpatient Hospital Stay (HOSPITAL_COMMUNITY): Payer: Medicaid Other | Admitting: Anesthesiology

## 2016-09-01 ENCOUNTER — Encounter (HOSPITAL_COMMUNITY): Payer: Self-pay | Admitting: Anesthesiology

## 2016-09-01 ENCOUNTER — Encounter (HOSPITAL_COMMUNITY)
Admission: RE | Disposition: A | Discharge status: Home / Self Care | Payer: Self-pay | Source: Ambulatory Visit | Attending: Neurosurgery

## 2016-09-01 ENCOUNTER — Inpatient Hospital Stay (HOSPITAL_COMMUNITY)
Admission: RE | Admit: 2016-09-01 | Discharge: 2016-09-04 | DRG: 026 | Disposition: A | Discharge status: Home / Self Care | Payer: Medicaid Other | Source: Ambulatory Visit | Attending: Neurosurgery | Admitting: Neurosurgery

## 2016-09-01 DIAGNOSIS — Z86011 Personal history of benign neoplasm of the brain: Secondary | ICD-10-CM

## 2016-09-01 DIAGNOSIS — R569 Unspecified convulsions: Secondary | ICD-10-CM | POA: Diagnosis present

## 2016-09-01 DIAGNOSIS — R350 Frequency of micturition: Secondary | ICD-10-CM | POA: Diagnosis present

## 2016-09-01 DIAGNOSIS — R51 Headache: Secondary | ICD-10-CM | POA: Diagnosis present

## 2016-09-01 DIAGNOSIS — Z6841 Body Mass Index (BMI) 40.0 and over, adult: Secondary | ICD-10-CM

## 2016-09-01 DIAGNOSIS — Z7984 Long term (current) use of oral hypoglycemic drugs: Secondary | ICD-10-CM | POA: Diagnosis not present

## 2016-09-01 DIAGNOSIS — Z23 Encounter for immunization: Secondary | ICD-10-CM | POA: Diagnosis not present

## 2016-09-01 DIAGNOSIS — K219 Gastro-esophageal reflux disease without esophagitis: Secondary | ICD-10-CM | POA: Diagnosis present

## 2016-09-01 DIAGNOSIS — D329 Benign neoplasm of meninges, unspecified: Secondary | ICD-10-CM

## 2016-09-01 DIAGNOSIS — H538 Other visual disturbances: Secondary | ICD-10-CM | POA: Diagnosis present

## 2016-09-01 DIAGNOSIS — Z8249 Family history of ischemic heart disease and other diseases of the circulatory system: Secondary | ICD-10-CM | POA: Diagnosis not present

## 2016-09-01 DIAGNOSIS — Z87891 Personal history of nicotine dependence: Secondary | ICD-10-CM

## 2016-09-01 DIAGNOSIS — I10 Essential (primary) hypertension: Secondary | ICD-10-CM | POA: Diagnosis present

## 2016-09-01 DIAGNOSIS — E119 Type 2 diabetes mellitus without complications: Secondary | ICD-10-CM | POA: Diagnosis present

## 2016-09-01 DIAGNOSIS — Z9889 Other specified postprocedural states: Secondary | ICD-10-CM

## 2016-09-01 DIAGNOSIS — Z79899 Other long term (current) drug therapy: Secondary | ICD-10-CM | POA: Diagnosis not present

## 2016-09-01 HISTORY — PX: CRANIOTOMY: SHX93

## 2016-09-01 HISTORY — DX: Benign neoplasm of meninges, unspecified: D32.9

## 2016-09-01 LAB — GLUCOSE, CAPILLARY
GLUCOSE-CAPILLARY: 199 mg/dL — AB (ref 65–99)
GLUCOSE-CAPILLARY: 302 mg/dL — AB (ref 65–99)
Glucose-Capillary: 178 mg/dL — ABNORMAL HIGH (ref 65–99)

## 2016-09-01 LAB — POCT I-STAT 7, (LYTES, BLD GAS, ICA,H+H)
ACID-BASE EXCESS: 3 mmol/L — AB (ref 0.0–2.0)
Bicarbonate: 26.9 mmol/L (ref 20.0–28.0)
Calcium, Ion: 1.12 mmol/L — ABNORMAL LOW (ref 1.15–1.40)
HEMATOCRIT: 31 % — AB (ref 39.0–52.0)
HEMOGLOBIN: 10.5 g/dL — AB (ref 13.0–17.0)
O2 SAT: 98 %
PCO2 ART: 37.5 mmHg (ref 32.0–48.0)
POTASSIUM: 3.8 mmol/L (ref 3.5–5.1)
Sodium: 140 mmol/L (ref 135–145)
TCO2: 28 mmol/L (ref 0–100)
pH, Arterial: 7.459 — ABNORMAL HIGH (ref 7.350–7.450)
pO2, Arterial: 87 mmHg (ref 83.0–108.0)

## 2016-09-01 LAB — MRSA PCR SCREENING: MRSA by PCR: NEGATIVE

## 2016-09-01 SURGERY — CRANIOTOMY TUMOR EXCISION
Anesthesia: General

## 2016-09-01 MED ORDER — LACTATED RINGERS IV SOLN
INTRAVENOUS | Status: DC
  Administered 2016-09-01: 13:00:00 via INTRAVENOUS

## 2016-09-01 MED ORDER — MEPERIDINE HCL 25 MG/ML IJ SOLN: 6.2500 mg | INTRAMUSCULAR | Status: DC | PRN

## 2016-09-01 MED ORDER — SODIUM CHLORIDE 0.9 % IV SOLN: INTRAVENOUS | Status: DC | PRN

## 2016-09-01 MED ORDER — SODIUM CHLORIDE 0.9 % IR SOLN
Status: DC | PRN
  Administered 2016-09-01: 3000 mL

## 2016-09-01 MED ORDER — LACOSAMIDE 50 MG PO TABS
100.0000 mg | ORAL_TABLET | Freq: Two times a day (BID) | ORAL | Status: DC
  Administered 2016-09-01 – 2016-09-04 (×6): 100 mg via ORAL
  Filled 2016-09-01: qty 1
  Filled 2016-09-01: qty 2
  Filled 2016-09-01: qty 1
  Filled 2016-09-01 (×4): qty 2

## 2016-09-01 MED ORDER — PANTOPRAZOLE SODIUM 40 MG IV SOLR: 40.0000 mg | Freq: Every day | INTRAVENOUS | Status: DC

## 2016-09-01 MED ORDER — DOCUSATE SODIUM 100 MG PO CAPS
100.0000 mg | ORAL_CAPSULE | Freq: Two times a day (BID) | ORAL | Status: DC
  Administered 2016-09-02 – 2016-09-04 (×4): 100 mg via ORAL
  Filled 2016-09-01 (×5): qty 1

## 2016-09-01 MED ORDER — PROPOFOL 10 MG/ML IV BOLUS
INTRAVENOUS | Status: DC | PRN
  Administered 2016-09-01: 60 mg via INTRAVENOUS
  Administered 2016-09-01: 100 mg via INTRAVENOUS
  Administered 2016-09-01: 40 mg via INTRAVENOUS
  Administered 2016-09-01: 200 mg via INTRAVENOUS

## 2016-09-01 MED ORDER — ONDANSETRON HCL 4 MG/2ML IJ SOLN
INTRAMUSCULAR | Status: AC
  Filled 2016-09-01: qty 2

## 2016-09-01 MED ORDER — DEXAMETHASONE SODIUM PHOSPHATE 10 MG/ML IJ SOLN
INTRAMUSCULAR | Status: AC
  Filled 2016-09-01: qty 1

## 2016-09-01 MED ORDER — FENTANYL CITRATE (PF) 100 MCG/2ML IJ SOLN
INTRAMUSCULAR | Status: DC
Start: 2016-09-01 — End: 2016-09-01
  Filled 2016-09-01: qty 2

## 2016-09-01 MED ORDER — PHENYLEPHRINE HCL 10 MG/ML IJ SOLN
INTRAVENOUS | Status: DC | PRN
  Administered 2016-09-01: 25 ug/min via INTRAVENOUS

## 2016-09-01 MED ORDER — CEFAZOLIN SODIUM-DEXTROSE 2-3 GM-% IV SOLR: INTRAVENOUS | Status: DC | PRN

## 2016-09-01 MED ORDER — EPHEDRINE 5 MG/ML INJ
INTRAVENOUS | Status: AC
  Filled 2016-09-01: qty 10

## 2016-09-01 MED ORDER — BISACODYL 5 MG PO TBEC: 5.0000 mg | DELAYED_RELEASE_TABLET | Freq: Every day | ORAL | Status: DC | PRN

## 2016-09-01 MED ORDER — SODIUM CHLORIDE 0.9 % IV SOLN
0.0500 ug/kg/min | INTRAVENOUS | Status: AC
  Administered 2016-09-01: 0.1 ug/kg/min via INTRAVENOUS
  Filled 2016-09-01: qty 5000

## 2016-09-01 MED ORDER — PROMETHAZINE HCL 25 MG/ML IJ SOLN: 6.2500 mg | INTRAMUSCULAR | Status: DC | PRN

## 2016-09-01 MED ORDER — ATORVASTATIN CALCIUM 10 MG PO TABS
20.0000 mg | ORAL_TABLET | Freq: Every day | ORAL | Status: DC
  Administered 2016-09-02 – 2016-09-03 (×2): 20 mg via ORAL
  Filled 2016-09-01 (×2): qty 2

## 2016-09-01 MED ORDER — BACITRACIN ZINC 500 UNIT/GM EX OINT
TOPICAL_OINTMENT | CUTANEOUS | Status: AC
  Filled 2016-09-01: qty 28.35

## 2016-09-01 MED ORDER — MAGNESIUM CITRATE PO SOLN: 1.0000 | Freq: Once | ORAL | Status: DC | PRN

## 2016-09-01 MED ORDER — PANTOPRAZOLE SODIUM 40 MG PO TBEC
40.0000 mg | DELAYED_RELEASE_TABLET | Freq: Every day | ORAL | Status: DC
  Administered 2016-09-02 – 2016-09-04 (×3): 40 mg via ORAL
  Filled 2016-09-01 (×3): qty 1

## 2016-09-01 MED ORDER — THROMBIN 20000 UNITS EX SOLR
CUTANEOUS | Status: DC | PRN
  Administered 2016-09-01: 16:00:00 via TOPICAL

## 2016-09-01 MED ORDER — MIDAZOLAM HCL 2 MG/2ML IJ SOLN
INTRAMUSCULAR | Status: AC
  Filled 2016-09-01: qty 2

## 2016-09-01 MED ORDER — FENTANYL CITRATE (PF) 250 MCG/5ML IJ SOLN
INTRAMUSCULAR | Status: AC
  Filled 2016-09-01: qty 5

## 2016-09-01 MED ORDER — SENNOSIDES-DOCUSATE SODIUM 8.6-50 MG PO TABS: 1.0000 | ORAL_TABLET | Freq: Every evening | ORAL | Status: DC | PRN

## 2016-09-01 MED ORDER — SUGAMMADEX SODIUM 200 MG/2ML IV SOLN
INTRAVENOUS | Status: DC | PRN
  Administered 2016-09-01: 300 mg via INTRAVENOUS

## 2016-09-01 MED ORDER — NALOXONE HCL 0.4 MG/ML IJ SOLN: 0.0800 mg | INTRAMUSCULAR | Status: DC | PRN

## 2016-09-01 MED ORDER — HYDROMORPHONE HCL 1 MG/ML IJ SOLN
0.2500 mg | INTRAMUSCULAR | Status: DC | PRN
  Administered 2016-09-01: 0.5 mg via INTRAVENOUS

## 2016-09-01 MED ORDER — PROPOFOL 10 MG/ML IV BOLUS
INTRAVENOUS | Status: AC
  Filled 2016-09-01: qty 20

## 2016-09-01 MED ORDER — HYDROMORPHONE HCL 1 MG/ML IJ SOLN
INTRAMUSCULAR | Status: AC
  Filled 2016-09-01: qty 1

## 2016-09-01 MED ORDER — AMLODIPINE BESY-BENAZEPRIL HCL 10-20 MG PO CAPS: 1.0000 | ORAL_CAPSULE | Freq: Every day | ORAL | Status: DC

## 2016-09-01 MED ORDER — CALCIUM CARBONATE ANTACID 500 MG PO CHEW: 1.0000 | CHEWABLE_TABLET | Freq: Two times a day (BID) | ORAL | Status: DC | PRN

## 2016-09-01 MED ORDER — ALBUTEROL SULFATE (2.5 MG/3ML) 0.083% IN NEBU: 2.5000 mg | INHALATION_SOLUTION | RESPIRATORY_TRACT | Status: DC | PRN

## 2016-09-01 MED ORDER — PHENYLEPHRINE 40 MCG/ML (10ML) SYRINGE FOR IV PUSH (FOR BLOOD PRESSURE SUPPORT)
PREFILLED_SYRINGE | INTRAVENOUS | Status: AC
  Filled 2016-09-01: qty 10

## 2016-09-01 MED ORDER — LIDOCAINE-EPINEPHRINE 1 %-1:100000 IJ SOLN
INTRAMUSCULAR | Status: DC | PRN
  Administered 2016-09-01: 10 mL

## 2016-09-01 MED ORDER — ACETAMINOPHEN 10 MG/ML IV SOLN: 1000.0000 mg | Freq: Once | INTRAVENOUS | Status: DC | PRN

## 2016-09-01 MED ORDER — HEMOSTATIC AGENTS (NO CHARGE) OPTIME
TOPICAL | Status: DC | PRN
  Administered 2016-09-01: 1 via TOPICAL

## 2016-09-01 MED ORDER — GABAPENTIN 300 MG PO CAPS
300.0000 mg | ORAL_CAPSULE | Freq: Three times a day (TID) | ORAL | Status: DC | PRN
  Filled 2016-09-01: qty 1

## 2016-09-01 MED ORDER — SUGAMMADEX SODIUM 500 MG/5ML IV SOLN
INTRAVENOUS | Status: AC
  Filled 2016-09-01: qty 5

## 2016-09-01 MED ORDER — ONDANSETRON HCL 4 MG PO TABS: 4.0000 mg | ORAL_TABLET | ORAL | Status: DC | PRN

## 2016-09-01 MED ORDER — BENAZEPRIL HCL 20 MG PO TABS
20.0000 mg | ORAL_TABLET | Freq: Every day | ORAL | Status: DC
Start: 2016-09-02 — End: 2016-09-04
  Administered 2016-09-02 – 2016-09-04 (×3): 20 mg via ORAL
  Filled 2016-09-01 (×3): qty 1

## 2016-09-01 MED ORDER — ALBUTEROL SULFATE HFA 108 (90 BASE) MCG/ACT IN AERS: 2.0000 | INHALATION_SPRAY | RESPIRATORY_TRACT | Status: DC | PRN

## 2016-09-01 MED ORDER — HYDROCODONE-ACETAMINOPHEN 5-325 MG PO TABS
1.0000 | ORAL_TABLET | ORAL | Status: DC | PRN
  Administered 2016-09-01 – 2016-09-04 (×8): 1 via ORAL
  Filled 2016-09-01 (×8): qty 1

## 2016-09-01 MED ORDER — POTASSIUM CHLORIDE IN NACL 20-0.9 MEQ/L-% IV SOLN
INTRAVENOUS | Status: DC
  Administered 2016-09-01 – 2016-09-02 (×2): via INTRAVENOUS
  Filled 2016-09-01 (×2): qty 1000

## 2016-09-01 MED ORDER — VANCOMYCIN HCL 1000 MG IV SOLR
INTRAVENOUS | Status: AC
  Filled 2016-09-01: qty 1000

## 2016-09-01 MED ORDER — DEXAMETHASONE 2 MG PO TABS
6.0000 mg | ORAL_TABLET | Freq: Four times a day (QID) | ORAL | Status: AC
  Administered 2016-09-01 – 2016-09-02 (×4): 6 mg via ORAL
  Filled 2016-09-01 (×4): qty 1

## 2016-09-01 MED ORDER — LIDOCAINE 2% (20 MG/ML) 5 ML SYRINGE
INTRAMUSCULAR | Status: AC
  Filled 2016-09-01: qty 5

## 2016-09-01 MED ORDER — PROMETHAZINE HCL 25 MG PO TABS: 12.5000 mg | ORAL_TABLET | ORAL | Status: DC | PRN

## 2016-09-01 MED ORDER — MORPHINE SULFATE (PF) 2 MG/ML IV SOLN
1.0000 mg | INTRAVENOUS | Status: DC | PRN
  Administered 2016-09-01 – 2016-09-02 (×3): 2 mg via INTRAVENOUS
  Filled 2016-09-01 (×3): qty 1

## 2016-09-01 MED ORDER — THROMBIN 20000 UNITS EX SOLR
CUTANEOUS | Status: AC
  Filled 2016-09-01: qty 20000

## 2016-09-01 MED ORDER — LEVETIRACETAM 500 MG PO TABS: 1000.0000 mg | ORAL_TABLET | Freq: Two times a day (BID) | ORAL | Status: DC

## 2016-09-01 MED ORDER — MIDAZOLAM HCL 2 MG/2ML IJ SOLN
INTRAMUSCULAR | Status: DC | PRN
  Administered 2016-09-01: 2 mg via INTRAVENOUS

## 2016-09-01 MED ORDER — ALBUMIN HUMAN 5 % IV SOLN
INTRAVENOUS | Status: DC | PRN
  Administered 2016-09-01 (×2): via INTRAVENOUS

## 2016-09-01 MED ORDER — LACOSAMIDE 100 MG PO TABS: 100.0000 mg | ORAL_TABLET | Freq: Two times a day (BID) | ORAL | Status: DC

## 2016-09-01 MED ORDER — FUROSEMIDE 40 MG PO TABS
40.0000 mg | ORAL_TABLET | Freq: Every day | ORAL | Status: DC
  Administered 2016-09-01 – 2016-09-04 (×4): 40 mg via ORAL
  Filled 2016-09-01 (×4): qty 1

## 2016-09-01 MED ORDER — LIDOCAINE 2% (20 MG/ML) 5 ML SYRINGE
INTRAMUSCULAR | Status: DC | PRN
  Administered 2016-09-01: 60 mg via INTRAVENOUS
  Administered 2016-09-01: 40 mg via INTRAVENOUS

## 2016-09-01 MED ORDER — DEXAMETHASONE SODIUM PHOSPHATE 10 MG/ML IJ SOLN
INTRAMUSCULAR | Status: DC | PRN
  Administered 2016-09-01: 10 mg via INTRAVENOUS

## 2016-09-01 MED ORDER — SODIUM CHLORIDE 0.9 % IV SOLN
INTRAVENOUS | Status: DC | PRN
  Administered 2016-09-01 (×2): via INTRAVENOUS

## 2016-09-01 MED ORDER — ONDANSETRON HCL 4 MG/2ML IJ SOLN
INTRAMUSCULAR | Status: DC | PRN
  Administered 2016-09-01 (×2): 4 mg via INTRAVENOUS

## 2016-09-01 MED ORDER — LIDOCAINE-EPINEPHRINE 1 %-1:100000 IJ SOLN
INTRAMUSCULAR | Status: AC
  Filled 2016-09-01: qty 1

## 2016-09-01 MED ORDER — FENTANYL CITRATE (PF) 100 MCG/2ML IJ SOLN
INTRAMUSCULAR | Status: DC | PRN
  Administered 2016-09-01 (×2): 100 ug via INTRAVENOUS
  Administered 2016-09-01: 50 ug via INTRAVENOUS

## 2016-09-01 MED ORDER — HYDROCODONE-ACETAMINOPHEN 5-325 MG PO TABS: 1.0000 | ORAL_TABLET | ORAL | Status: DC | PRN

## 2016-09-01 MED ORDER — FUROSEMIDE 40 MG PO TABS: 40.0000 mg | ORAL_TABLET | Freq: Every day | ORAL | Status: DC

## 2016-09-01 MED ORDER — DEXAMETHASONE 4 MG PO TABS
4.0000 mg | ORAL_TABLET | Freq: Four times a day (QID) | ORAL | Status: AC
  Administered 2016-09-03 (×4): 4 mg via ORAL
  Filled 2016-09-01 (×4): qty 1

## 2016-09-01 MED ORDER — METFORMIN HCL 500 MG PO TABS
500.0000 mg | ORAL_TABLET | Freq: Two times a day (BID) | ORAL | Status: DC
  Administered 2016-09-02 – 2016-09-04 (×5): 500 mg via ORAL
  Filled 2016-09-01 (×5): qty 1

## 2016-09-01 MED ORDER — BACITRACIN ZINC 500 UNIT/GM EX OINT
TOPICAL_OINTMENT | CUTANEOUS | Status: DC | PRN
  Administered 2016-09-01 (×2): 1 via TOPICAL

## 2016-09-01 MED ORDER — DEXAMETHASONE 4 MG PO TABS
4.0000 mg | ORAL_TABLET | Freq: Three times a day (TID) | ORAL | Status: DC
  Administered 2016-09-04: 4 mg via ORAL
  Filled 2016-09-01: qty 1

## 2016-09-01 MED ORDER — AMLODIPINE BESYLATE 10 MG PO TABS
10.0000 mg | ORAL_TABLET | Freq: Every day | ORAL | Status: DC
Start: 2016-09-02 — End: 2016-09-04
  Administered 2016-09-02 – 2016-09-04 (×3): 10 mg via ORAL
  Filled 2016-09-01 (×3): qty 1

## 2016-09-01 MED ORDER — PNEUMOCOCCAL VAC POLYVALENT 25 MCG/0.5ML IJ INJ
0.5000 mL | INJECTION | INTRAMUSCULAR | Status: AC
  Administered 2016-09-03: 0.5 mL via INTRAMUSCULAR
  Filled 2016-09-01: qty 0.5

## 2016-09-01 MED ORDER — ONDANSETRON HCL 4 MG/2ML IJ SOLN: 4.0000 mg | INTRAMUSCULAR | Status: DC | PRN

## 2016-09-01 MED ORDER — LABETALOL HCL 5 MG/ML IV SOLN: 10.0000 mg | INTRAVENOUS | Status: DC | PRN

## 2016-09-01 MED ORDER — ROCURONIUM BROMIDE 10 MG/ML (PF) SYRINGE
PREFILLED_SYRINGE | INTRAVENOUS | Status: AC
  Filled 2016-09-01: qty 5

## 2016-09-01 MED ORDER — ROCURONIUM BROMIDE 10 MG/ML (PF) SYRINGE
PREFILLED_SYRINGE | INTRAVENOUS | Status: DC | PRN
  Administered 2016-09-01: 20 mg via INTRAVENOUS
  Administered 2016-09-01: 75 mg via INTRAVENOUS
  Administered 2016-09-01: 25 mg via INTRAVENOUS

## 2016-09-01 MED ORDER — LEVETIRACETAM 500 MG PO TABS
1000.0000 mg | ORAL_TABLET | Freq: Two times a day (BID) | ORAL | Status: DC
  Administered 2016-09-01 – 2016-09-04 (×6): 1000 mg via ORAL
  Filled 2016-09-01 (×6): qty 2

## 2016-09-01 SURGICAL SUPPLY — 93 items
APL SKNCLS STERI-STRIP NONHPOA (GAUZE/BANDAGES/DRESSINGS)
BANDAGE GAUZE 4  KLING STR (GAUZE/BANDAGES/DRESSINGS) ×2 IMPLANT
BENZOIN TINCTURE PRP APPL 2/3 (GAUZE/BANDAGES/DRESSINGS) IMPLANT
BLADE CLIPPER SURG (BLADE) ×3 IMPLANT
BLADE SAW GIGLI 16 STRL (MISCELLANEOUS) IMPLANT
BLADE SURG 15 STRL LF DISP TIS (BLADE) IMPLANT
BLADE SURG 15 STRL SS (BLADE)
BLADE ULTRA TIP 2M (BLADE) ×3 IMPLANT
BNDG GAUZE ELAST 4 BULKY (GAUZE/BANDAGES/DRESSINGS) ×4 IMPLANT
BUR ACORN 6.0 PRECISION (BURR) ×2 IMPLANT
BUR ACORN 6.0MM PRECISION (BURR) ×1
BUR MATCHSTICK NEURO 3.0 LAGG (BURR) ×2 IMPLANT
BUR SPIRAL ROUTER 2.3 (BUR) IMPLANT
BUR SPIRAL ROUTER 2.3MM (BUR)
CANISTER SUCT 3000ML PPV (MISCELLANEOUS) ×5 IMPLANT
CARTRIDGE OIL MAESTRO DRILL (MISCELLANEOUS) ×1 IMPLANT
CATH VENTRIC 35X38 W/TROCAR LG (CATHETERS) IMPLANT
CLIP TI MEDIUM 6 (CLIP) IMPLANT
CONT SPEC 4OZ CLIKSEAL STRL BL (MISCELLANEOUS) ×1 IMPLANT
COVER MAYO STAND STRL (DRAPES) IMPLANT
DECANTER SPIKE VIAL GLASS SM (MISCELLANEOUS) ×1 IMPLANT
DIFFUSER DRILL AIR PNEUMATIC (MISCELLANEOUS) ×3 IMPLANT
DRAIN SUBARACHNOID (WOUND CARE) IMPLANT
DRAPE CAMERA VIDEO/LASER (DRAPES) IMPLANT
DRAPE MICROSCOPE LEICA (MISCELLANEOUS) ×2 IMPLANT
DRAPE NEUROLOGICAL W/INCISE (DRAPES) IMPLANT
DRAPE ORTHO SPLIT 77X108 STRL (DRAPES)
DRAPE STERI IOBAN 125X83 (DRAPES) IMPLANT
DRAPE SURG 17X23 STRL (DRAPES) IMPLANT
DRAPE SURG ORHT 6 SPLT 77X108 (DRAPES) IMPLANT
DRAPE WARM FLUID 44X44 (DRAPE) ×3 IMPLANT
DURAMATRIX ONLAY 3X3 (Plate) ×2 IMPLANT
DURAPREP 6ML APPLICATOR 50/CS (WOUND CARE) ×3 IMPLANT
ELECT CAUTERY BLADE 6.4 (BLADE) ×3 IMPLANT
ELECT REM PT RETURN 9FT ADLT (ELECTROSURGICAL) ×3
ELECTRODE REM PT RTRN 9FT ADLT (ELECTROSURGICAL) ×1 IMPLANT
EVACUATOR 1/8 PVC DRAIN (DRAIN) IMPLANT
EVACUATOR SILICONE 100CC (DRAIN) IMPLANT
FORCEPS BIPOLAR SPETZLER 8 1.0 (NEUROSURGERY SUPPLIES) ×2 IMPLANT
GAUZE SPONGE 4X4 12PLY STRL (GAUZE/BANDAGES/DRESSINGS) ×3 IMPLANT
GAUZE SPONGE 4X4 16PLY XRAY LF (GAUZE/BANDAGES/DRESSINGS) IMPLANT
GLOVE ECLIPSE 6.5 STRL STRAW (GLOVE) ×3 IMPLANT
GLOVE EXAM NITRILE LRG STRL (GLOVE) IMPLANT
GLOVE EXAM NITRILE XL STR (GLOVE) IMPLANT
GLOVE EXAM NITRILE XS STR PU (GLOVE) IMPLANT
GOWN STRL REUS W/ TWL LRG LVL3 (GOWN DISPOSABLE) ×2 IMPLANT
GOWN STRL REUS W/ TWL XL LVL3 (GOWN DISPOSABLE) IMPLANT
GOWN STRL REUS W/TWL 2XL LVL3 (GOWN DISPOSABLE) IMPLANT
GOWN STRL REUS W/TWL LRG LVL3 (GOWN DISPOSABLE) ×6
GOWN STRL REUS W/TWL XL LVL3 (GOWN DISPOSABLE)
HEMOSTAT SURGICEL 2X14 (HEMOSTASIS) ×2 IMPLANT
HOOK DURA 1/2IN (MISCELLANEOUS) ×2 IMPLANT
KIT BASIN OR (CUSTOM PROCEDURE TRAY) ×3 IMPLANT
KIT DRAIN CSF ACCUDRAIN (MISCELLANEOUS) IMPLANT
KIT ROOM TURNOVER OR (KITS) ×3 IMPLANT
NDL HYPO 25X1 1.5 SAFETY (NEEDLE) ×1 IMPLANT
NDL SPNL 18GX3.5 QUINCKE PK (NEEDLE) IMPLANT
NEEDLE HYPO 25X1 1.5 SAFETY (NEEDLE) ×3 IMPLANT
NEEDLE SPNL 18GX3.5 QUINCKE PK (NEEDLE) IMPLANT
NS IRRIG 1000ML POUR BTL (IV SOLUTION) ×7 IMPLANT
OIL CARTRIDGE MAESTRO DRILL (MISCELLANEOUS) ×3
PACK CRANIOTOMY (CUSTOM PROCEDURE TRAY) ×3 IMPLANT
PATTIES SURGICAL .25X.25 (GAUZE/BANDAGES/DRESSINGS) IMPLANT
PATTIES SURGICAL .5 X.5 (GAUZE/BANDAGES/DRESSINGS) IMPLANT
PATTIES SURGICAL .5 X1 (DISPOSABLE) ×4 IMPLANT
PATTIES SURGICAL .5 X3 (DISPOSABLE) IMPLANT
PATTIES SURGICAL 1/4 X 3 (GAUZE/BANDAGES/DRESSINGS) IMPLANT
PATTIES SURGICAL 1X1 (DISPOSABLE) ×2 IMPLANT
PLATE 1.5  2HOLE LNG NEURO (Plate) ×6 IMPLANT
PLATE 1.5 2HOLE LNG NEURO (Plate) IMPLANT
RUBBERBAND STERILE (MISCELLANEOUS) ×2 IMPLANT
SCREW SELF DRILL HT 1.5/4MM (Screw) ×12 IMPLANT
SPECIMEN JAR SMALL (MISCELLANEOUS) IMPLANT
SPONGE NEURO XRAY DETECT 1X3 (DISPOSABLE) IMPLANT
SPONGE SURGIFOAM ABS GEL 100 (HEMOSTASIS) ×3 IMPLANT
STAPLER VISISTAT 35W (STAPLE) ×5 IMPLANT
SUT ETHILON 3 0 FSL (SUTURE) IMPLANT
SUT ETHILON 3 0 PS 1 (SUTURE) IMPLANT
SUT NURALON 4 0 TR CR/8 (SUTURE) ×9 IMPLANT
SUT SILK 0 TIES 10X30 (SUTURE) IMPLANT
SUT STEEL 0 (SUTURE)
SUT STEEL 0 18XMFL TIE 17 (SUTURE) IMPLANT
SUT VIC AB 2-0 CT2 18 VCP726D (SUTURE) ×10 IMPLANT
SYR CONTROL 10ML LL (SYRINGE) ×1 IMPLANT
TAPE SURG TRANSPORE 1 IN (GAUZE/BANDAGES/DRESSINGS) IMPLANT
TAPE SURGICAL TRANSPORE 1 IN (GAUZE/BANDAGES/DRESSINGS) ×2
TOWEL GREEN STERILE (TOWEL DISPOSABLE) ×2 IMPLANT
TOWEL GREEN STERILE FF (TOWEL DISPOSABLE) ×3 IMPLANT
TRAY FOLEY W/METER SILVER 16FR (SET/KITS/TRAYS/PACK) ×3 IMPLANT
TUBE CONNECTING 12'X1/4 (SUCTIONS) ×1
TUBE CONNECTING 12X1/4 (SUCTIONS) ×2 IMPLANT
UNDERPAD 30X30 (UNDERPADS AND DIAPERS) ×3 IMPLANT
WATER STERILE IRR 1000ML POUR (IV SOLUTION) ×3 IMPLANT
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
## 2016-09-01 NOTE — Anesthesia Preprocedure Evaluation (Addendum)
Anesthesia Evaluation  Patient identified by MRN, date of birth, ID band  Reviewed: Allergy & Precautions, NPO status , Patient's Chart, lab work & pertinent test results  Airway Mallampati: III       Dental no notable dental hx. (+) Poor Dentition, Dental Advisory Given, Missing   Pulmonary former smoker,    Pulmonary exam normal breath sounds clear to auscultation       Cardiovascular hypertension, Pt. on medications Normal cardiovascular exam     Neuro/Psych    GI/Hepatic Neg liver ROS, GERD  Medicated and Controlled,  Endo/Other  diabetes, Type 2, Oral Hypoglycemic AgentsMorbid obesity  Renal/GU negative Renal ROS  negative genitourinary   Musculoskeletal negative musculoskeletal ROS (+)   Abdominal (+) + obese,   Peds  Hematology negative hematology ROS (+)   Anesthesia Other Findings   Reproductive/Obstetrics                           BP Readings from Last 3 Encounters:  09/01/16 (!) 150/84  01/05/16 153/81  11/10/15 146/80   Lab Results  Component Value Date   WBC 12.1 (H) 08/26/2016   HGB 13.5 08/26/2016   HCT 38.4 (L) 08/26/2016   MCV 74.0 (L) 08/26/2016   PLT 309 08/26/2016     Chemistry      Component Value Date/Time   NA 133 (L) 08/26/2016 1432   K 4.1 08/26/2016 1432   CL 97 (L) 08/26/2016 1432   CO2 28 08/26/2016 1432   BUN 19 08/26/2016 1432   CREATININE 1.00 08/26/2016 1432      Component Value Date/Time   CALCIUM 9.2 08/26/2016 1432   ALKPHOS 67 01/04/2016 2225   AST 26 01/04/2016 2225   ALT 53 01/04/2016 2225   BILITOT 0.7 01/04/2016 2225      Anesthesia Physical Anesthesia Plan  ASA: III  Anesthesia Plan: General   Post-op Pain Management:    Induction: Intravenous  Airway Management Planned: Oral ETT  Additional Equipment: Arterial line  Intra-op Plan:   Post-operative Plan: Extubation in OR and Possible Post-op  intubation/ventilation  Informed Consent: I have reviewed the patients History and Physical, chart, labs and discussed the procedure including the risks, benefits and alternatives for the proposed anesthesia with the patient or authorized representative who has indicated his/her understanding and acceptance.     Plan Discussed with: CRNA and Surgeon  Anesthesia Plan Comments:        Anesthesia Quick Evaluation

## 2016-09-01 NOTE — Transfer of Care (Signed)
Immediate Anesthesia Transfer of Care Note  Patient: Thomas [name]  Procedure(s) Performed: Procedure(s): CRANIOTOMY TUMOR  LEFT PTERIONAL (N/A)  Patient Location: PACU  Anesthesia Type:General  Level of Consciousness: awake, alert , oriented and patient cooperative  Airway & Oxygen Therapy: Patient Spontanous Breathing and Patient connected to face mask oxygen  Post-op Assessment: Report given to RN, Post -op Vital signs reviewed and stable and Patient moving all extremities  Post vital signs: Reviewed and stable  Last Vitals:  Vitals:   09/01/16 1249 09/01/16 1838  BP: (!) 150/84   Pulse: 90   Resp: 20   Temp: 36.7 C 36.9 C    Last Pain:  Vitals:   09/01/16 1249  TempSrc: Oral  PainSc:       Patients Stated Pain Goal: 4 (91/66/06 0045)  Complications: No apparent anesthesia complications

## 2016-09-01 NOTE — Op Note (Signed)
09/01/2016  6:34 PM  PATIENT:  Thomas [name]  61 y.o. male who presented with a meningioma in the left frontal temporal area, overlying the sylvian fissure. He has requested that the tumor be resected.   PRE-OPERATIVE DIAGNOSIS:  MENINGIOMA  POST-OPERATIVE DIAGNOSIS:  MENINGIOMA  PROCEDURE:  Procedure(s): CRANIOTOMY TUMOR  LEFT PTERIONAL for tumor resection.  Dural patch grafting  SURGEON: Surgeon(s): Ashok Pall, MD Eustace Moore, MD  ASSISTANTS:Jones, Shanon Brow  ANESTHESIA:   general  EBL:  Total I/O In: 1500 [I.V.:1000; IV Piggyback:500] Out: 9892 [Urine:240; Blood:800]  BLOOD ADMINISTERED:none  CELL SAVER GIVEN:none  COUNT:per nursing  DRAINS: none   SPECIMEN:  Source of Specimen:  intracranial  DICTATION: [name] was taken to the operating room, intubated, and placed under a general anesthetic without difficulty. Once adequate anesthesia was obtained, I placed his head in a three pin Mayfield head holder. I then attached the Mayfield to the bed with the adapter. He was positioned with his head turned towards the left side. His head was shaved and  prepped and draped in a sterile manner. I infiltrated the planned incision site with lidocaine. I opened the incision with a 10 blade and placed Raney clips on the scalp edges. I reflected the skin flap rostrally and retracted the scalp with fish hooks rostrally. I then divided the temporalis muscle and left a cuff of muscle to reattach it on the temporal bone. I created a burr hole in the pterion, and in the temporal bone. I used the craniotome to create a skull flap connecting the burr holes. I elevated the skull flap without difficulty exposing the dura. I could not initially appreciate the tumor as the dura was quite firm through out. I opened the dura and then immediately encountered the tumor. It was mixed in its texture, some parts almost slimy, others quite firm. I worked slowly around the tumor using the bipolar cautery to  both shrink the capsule and devascularize the tumor. I used cottonoid patties to protect the brain, and provide borders for the dissection. I excised the dura attached to the tumor and eventually delivered the tumor from the brain surface. With Dr. Ronnald Ramp' assistance we achieved hemostasis and the resection bed was dry.  I sewed a dural patch using a dural substitute with interrupted sutures. I then approximated the skull flap with plates and screws. I approximated the temporalis muscle using suture. Finally I closed the scalp approximating the galea with sutures, and the scalp edges with staples. I applied a sterile dressing. I removed  The Mayfield from the head holder adapter, and subsequently the headholder from the head. He was extubated in the room doing well.   PLAN OF CARE: Admit to inpatient   PATIENT DISPOSITION:  PACU - hemodynamically stable.   Delay start of Pharmacological VTE agent (>24hrs) due to surgical blood loss or risk of bleeding:  yes

## 2016-09-02 ENCOUNTER — Encounter (HOSPITAL_COMMUNITY): Payer: Self-pay | Admitting: Neurosurgery

## 2016-09-02 LAB — GLUCOSE, CAPILLARY
GLUCOSE-CAPILLARY: 272 mg/dL — AB (ref 65–99)
Glucose-Capillary: 288 mg/dL — ABNORMAL HIGH (ref 65–99)
Glucose-Capillary: 302 mg/dL — ABNORMAL HIGH (ref 65–99)
Glucose-Capillary: 284 mg/dL — ABNORMAL HIGH (ref 65–99)

## 2016-09-02 MED ORDER — ORAL CARE MOUTH RINSE
15.0000 mL | Freq: Two times a day (BID) | OROMUCOSAL | Status: DC
  Administered 2016-09-02 (×2): 15 mL via OROMUCOSAL

## 2016-09-02 MED ORDER — MORPHINE SULFATE (PF) 4 MG/ML IV SOLN
1.0000 mg | INTRAVENOUS | Status: DC | PRN
  Administered 2016-09-02 – 2016-09-03 (×2): 2 mg via INTRAVENOUS
  Filled 2016-09-02 (×3): qty 1

## 2016-09-02 NOTE — Progress Notes (Signed)
Inpatient Diabetes Program Recommendations  AACE/ADA: New Consensus Statement on Inpatient Glycemic Control (2015)  Target Ranges:  Prepandial:   less than 140 mg/dL      Peak postprandial:   less than 180 mg/dL (1-2 hours)      Critically ill patients:  140 - 180 mg/dL   Results for [name], [name] (MRN [id number]) as of 09/02/2016 11:26  Ref. Range 09/01/2016 12:53 09/01/2016 18:40 09/01/2016 20:27 09/02/2016 07:50  Glucose-Capillary Latest Ref Range: 65 - 99 mg/dL 178 (H) 199 (H) 302 (H) 272 (H)   Review of Glycemic Control  Diabetes history: DM 2 Outpatient Diabetes medications: Metformin 500 mg BID Current orders for Inpatient glycemic control: Metformin 500 mg BID  Inpatient Diabetes Program Recommendations:    Glucose elevated in the 200's. Please consider add Novolog Sensitive Correction scale tid + Novolog HS scale.  Thanks,  Tama Headings RN, MSN, [facility] Inpatient Diabetes Coordinator Team Pager [phone] (8a-5p)

## 2016-09-02 NOTE — Progress Notes (Signed)
Patient ID: Thomas [name], male   DOB: 1956/05/05, 61 y.o.   MRN: [id number]  BP (!) 150/78   Pulse 91   Temp 98.4 F (36.9 C) (Oral)   Resp 16   Ht 5\' 8"  (1.727 m)   Wt (!) 144 kg (317 lb 8 oz)   SpO2 95%   BMI 48.28 kg/m  Alert and oriented x 4, speech is clear and fluent perrl Symmetric facial movements Tongue and uvula midline Dressing is dry Complaining of blurred vision in his right eye, believe this will improve in fairly short order

## 2016-09-02 NOTE — Anesthesia Postprocedure Evaluation (Addendum)
Anesthesia Post Note  Patient: Thomas [name]  Procedure(s) Performed: Procedure(s) (LRB): CRANIOTOMY TUMOR  LEFT PTERIONAL (N/A)  Patient location during evaluation: PACU Anesthesia Type: General Level of consciousness: awake and alert Pain management: pain level controlled Vital Signs Assessment: post-procedure vital signs reviewed and stable Respiratory status: spontaneous breathing, nonlabored ventilation, respiratory function stable and patient connected to nasal cannula oxygen Cardiovascular status: blood pressure returned to baseline and stable Postop Assessment: no signs of nausea or vomiting Anesthetic complications: no       Last Vitals:  Vitals:   09/02/16 1300 09/02/16 1400  BP: 135/70 138/75  Pulse: 91 96  Resp: 16 (!) 22  Temp:      Last Pain:  Vitals:   09/02/16 1255  TempSrc:   PainSc: 3                  Effie Berkshire

## 2016-09-03 LAB — GLUCOSE, CAPILLARY
GLUCOSE-CAPILLARY: 320 mg/dL — AB (ref 65–99)
GLUCOSE-CAPILLARY: 324 mg/dL — AB (ref 65–99)
Glucose-Capillary: 285 mg/dL — ABNORMAL HIGH (ref 65–99)
Glucose-Capillary: 310 mg/dL — ABNORMAL HIGH (ref 65–99)
Glucose-Capillary: 291 mg/dL — ABNORMAL HIGH (ref 65–99)
Glucose-Capillary: 278 mg/dL — ABNORMAL HIGH (ref 65–99)

## 2016-09-03 NOTE — Progress Notes (Signed)
Inpatient Diabetes Program Recommendations  AACE/ADA: New Consensus Statement on Inpatient Glycemic Control (2015)  Target Ranges:  Prepandial:   less than 140 mg/dL      Peak postprandial:   less than 180 mg/dL (1-2 hours)      Critically ill patients:  140 - 180 mg/dL   Review of Glycemic Control  Diabetes history: DM 2 Outpatient Diabetes medications: Metformin 500 mg BID Current orders for Inpatient glycemic control: Metformin 500 mg BID  Inpatient Diabetes Program Recommendations:    Glucose elevated in the 200-300 range. Please consider add Novolog Moderate Correction scale (0-15 units) tid + Novolog HS scale.  Thanks,  Tama Headings RN, MSN, [facility] Inpatient Diabetes Coordinator Team Pager [phone] (8a-5p)

## 2016-09-03 NOTE — Progress Notes (Signed)
Patient ID: Thomas [name], male   DOB: 13-Apr-1956, 61 y.o.   MRN: [id number] Subjective: Patient reports some soreness, no NTW  Objective: Vital signs in last 24 hours: Temp:  [98.4 F (36.9 C)-99.8 F (37.7 C)] 98.8 F (37.1 C) (04/27 0515) Pulse Rate:  [88-103] 95 (04/27 0515) Resp:  [13-22] 20 (04/27 0515) BP: (133-152)/(65-80) 152/68 (04/27 0515) SpO2:  [92 %-96 %] 95 % (04/27 0515) Arterial Line BP: (123-204)/(84-89) 178/85 (04/26 1200)  Intake/Output from previous day: 04/26 0701 - 04/27 0700 In: 1360 [P.O.:960; I.V.:400] Out: 2225 [Urine:2225] Intake/Output this shift: No intake/output data recorded.  Neurologic: Grossly normal L face swollen, no aphasia, no drift Lab Results: Lab Results  Component Value Date   WBC 12.1 (H) 08/26/2016   HGB 10.5 (L) 09/01/2016   HCT 31.0 (L) 09/01/2016   MCV 74.0 (L) 08/26/2016   PLT 309 08/26/2016   Lab Results  Component Value Date   INR 0.98 08/29/2015   BMET Lab Results  Component Value Date   NA 140 09/01/2016   K 3.8 09/01/2016   CL 97 (L) 08/26/2016   CO2 28 08/26/2016   GLUCOSE 208 (H) 08/26/2016   BUN 19 08/26/2016   CREATININE 1.00 08/26/2016   CALCIUM 9.2 08/26/2016    Studies/Results: No results found.  Assessment/Plan: Doing well, maybe home tomorrow   LOS: 2 days    Arik Husmann S 09/03/2016, 8:04 AM

## 2016-09-03 NOTE — Care Management Note (Addendum)
Case Management Note  Patient Details  Name: Thomas [name] MRN: [id number] Date of Birth: [birth year]  Subjective/Objective:    Pt underwent:   CRANIOTOMY TUMOR  LEFT PTERIONAL. He is from home with spouse.              Action/Plan: Current plan is for home when medically ready. CM following for d/c needs, physician orders.  4/28 Addendum, PT rec HH PT, however this is not covered for this Dx through [facility], patient not candidate for charity coverage as Medicaid recipient. No other CM needs identified.  Expected Discharge Date:                  Expected Discharge Plan:  Home/Self Care  In-House Referral:     Discharge planning Services     Post Acute Care Choice:    Choice offered to:     DME Arranged:    DME Agency:     HH Arranged:    HH Agency:     Status of Service:  In process, will continue to follow  If discussed at Long Length of Stay Meetings, dates discussed:    Additional Comments:  Pollie Friar, RN 09/03/2016, 3:44 PM

## 2016-09-04 LAB — GLUCOSE, CAPILLARY: GLUCOSE-CAPILLARY: 324 mg/dL — AB (ref 65–99)

## 2016-09-04 MED ORDER — METHYLPREDNISOLONE 4 MG PO TBPK: ORAL_TABLET | ORAL | 0 refills | Status: DC

## 2016-09-04 MED ORDER — HYDROCODONE-ACETAMINOPHEN 5-325 MG PO TABS: 1.0000 | ORAL_TABLET | ORAL | 0 refills | Status: DC | PRN

## 2016-09-04 NOTE — Discharge Summary (Signed)
Physician Discharge Summary  Patient ID: Thomas [name] MRN: [id number] DOB/AGE: Nov 17, 1955 61 y.o.  Admit date: 09/01/2016 Discharge date: 09/04/2016  Admission Diagnoses:  Meningioma  Discharge Diagnoses:  Same Active Problems:   S/P craniotomy   Meningioma [facility])   Discharged Condition: Stable  Hospital Course:  DAULTON HARBAUGH is a 61 y.o. male who was admitted for the below procedure. There were no post operative complications. Stable, mild, dull headache since surgery. Neurologically intact.  Pt was tolerating po. Voiding normal. Ambulating and working with PT/OT. Feels ready for discharge. Cleared medically.  Treatments: Surgery - CRANIOTOMY TUMOR  LEFT PTERIONAL for tumor resection.  Dural patch grafting  Discharge Exam: Blood pressure (!) 150/77, pulse 93, temperature 98.6 F (37 C), temperature source Oral, resp. rate 20, height 5\' 8"  (1.727 m), weight (!) 144 kg (317 lb 8 oz), SpO2 95 %. Awake, alert, oriented Speech fluent, appropriate CN grossly intact with the exception of ptosis of left eye (stable since surgery) 5/5 BUE/BLE Wound c/d/i  Disposition: 01-Home or Self Care  Discharge Instructions    Call MD for:  difficulty breathing, headache or visual disturbances    Complete by:  As directed    Call MD for:  persistant dizziness or light-headedness    Complete by:  As directed    Call MD for:  redness, tenderness, or signs of infection (pain, swelling, redness, odor or green/yellow discharge around incision site)    Complete by:  As directed    Call MD for:  severe uncontrolled pain    Complete by:  As directed    Call MD for:  temperature >100.4    Complete by:  As directed    Diet general    Complete by:  As directed    Driving Restrictions    Complete by:  As directed    Do not drive until given clearance.   Increase activity slowly    Complete by:  As directed    Lifting restrictions    Complete by:  As directed    Do not lift anything >10lbs.  Avoid bending and twisting in awkward positions. Avoid bending at the back.   May shower / Bathe    Complete by:  As directed    If head is covered and remains dry     Allergies as of 09/04/2016      Reactions   No Known Allergies       Medication List    TAKE these medications   acetaminophen 500 MG tablet Commonly known as:  TYLENOL Take 1,000 mg by mouth every 6 (six) hours as needed for headache (pain).   albuterol 108 (90 Base) MCG/ACT inhaler Commonly known as:  PROVENTIL HFA;VENTOLIN HFA Inhale 2 puffs into the lungs every 4 (four) hours as needed for wheezing or shortness of breath.   amLODipine-benazepril 10-20 MG capsule Commonly known as:  LOTREL Take 1 capsule by mouth daily.   atorvastatin 20 MG tablet Commonly known as:  LIPITOR Take 20 mg by mouth daily at 6 PM.   calcium carbonate 500 MG chewable tablet Commonly known as:  TUMS - dosed in mg elemental calcium Chew 1-2 tablets by mouth 2 (two) times daily as needed for indigestion or heartburn (DEPENDS ON INDIGESTION IF TAKES 1-2 TABLETS).   CORICIDIN HBP COLD/FLU PO Take 2 tablets by mouth 2 (two) times daily as needed (COLD SYMPTOMS).   furosemide 40 MG tablet Commonly known as:  LASIX Take 1 tablet (40 mg total)  by mouth daily.   furosemide 20 MG tablet Commonly known as:  LASIX Take 2 tablets (40 mg total) by mouth daily.   gabapentin 300 MG capsule Commonly known as:  NEURONTIN Take 1 capsule (300 mg total) by mouth 3 (three) times daily. What changed:  when to take this  reasons to take this   HYDROcodone-acetaminophen 5-325 MG tablet Commonly known as:  NORCO/VICODIN Take 1 tablet by mouth every 4 (four) hours as needed for moderate pain. What changed:  how much to take  how to take this  when to take this  reasons to take this  additional instructions   Lacosamide 100 MG Tabs Commonly known as:  VIMPAT Take 1 tablet (100 mg total) by mouth 2 (two) times daily.    levETIRAcetam 500 MG tablet Commonly known as:  KEPPRA Take 3 tablets (1,500 mg total) by mouth 2 (two) times daily.   levETIRAcetam 1000 MG tablet Commonly known as:  KEPPRA Take 1,000 mg by mouth 2 (two) times daily.   metFORMIN 500 MG tablet Commonly known as:  GLUCOPHAGE Take 1 tablet (500 mg total) by mouth 2 (two) times daily with a meal.   methylPREDNISolone 4 MG Tbpk tablet Commonly known as:  MEDROL Take according to package inserts   omeprazole 20 MG capsule Commonly known as:  PRILOSEC Take 20 mg by mouth daily.   UNABLE TO FIND 2 (two) times a week. Herbal Life Protein Powder: Mix into a shake and drink once a day      Follow-up Information    CABBELL,KYLE L, MD. Schedule an appointment as soon as possible for a visit in 10 day(s).   Specialty:  Neurosurgery Why:  staple removal Contact information: [address] [place] [address] [place] [address] [phone]           Signed: Traci Sermon 09/04/2016, 9:26 AM

## 2016-09-04 NOTE — Progress Notes (Addendum)
Patients blood sugar continues to be high, otherwise his vision and swelling look much better and he remains in good spirits as well as cognition.

## 2016-09-04 NOTE — Progress Notes (Signed)
Patient discharged home with spouse. Discharge instructions given. IV removed. Patient questions asked and answered. Patient transported from unit via wheelchair with staff. Thomas [name]

## 2016-09-04 NOTE — Evaluation (Addendum)
Physical Therapy Evaluation Patient Details Name: Thomas [name] MRN: [id number] DOB: 10-31-1955 Today's Date: 09/04/2016   History of Present Illness  Pt is a 61 y/o male s/p craniotomy for removal of brain tumor meningioma. PMH including but not limited to HTN and DM.  Clinical Impression  Pt presented sitting OOB in recliner chair, awake and willing to participate in therapy session. Prior to admission, pt reported that he was independent with all functional mobility and ADLs. Pt lives at home with his wife and children. Pt will have 24/7 assistance at home initially. Pt able to ambulate a short distance in hallway with close min guard and no AD. However, he was limited by his headache at this time. Pt would continue to benefit from skilled physical therapy services at this time while admitted and after d/c to address the below listed limitations in order to improve overall safety and independence with functional mobility.     Follow Up Recommendations Home health PT;Supervision/Assistance - 24 hour    Equipment Recommendations  None recommended by PT    Recommendations for Other Services       Precautions / Restrictions Precautions Precautions: Fall Restrictions Weight Bearing Restrictions: No      Mobility  Bed Mobility               General bed mobility comments: pt sitting in recliner chair when therapist entered room  Transfers Overall transfer level: Needs assistance Equipment used: None Transfers: Sit to/from Stand Sit to Stand: Supervision         General transfer comment: supervision for safety  Ambulation/Gait Ambulation/Gait assistance: Min guard Ambulation Distance (Feet): 40 Feet Assistive device: None Gait Pattern/deviations: Step-through pattern;Decreased step length - right;Decreased step length - left;Decreased stride length;Shuffle Gait velocity: decreased Gait velocity interpretation: Below normal speed for age/gender General Gait Details:  modest instability with ambulation requiring close min guard but no LOB  Stairs            Wheelchair Mobility    Modified Rankin (Stroke Patients Only)       Balance Overall balance assessment: Needs assistance Sitting-balance support: Feet supported Sitting balance-Leahy Scale: Good     Standing balance support: During functional activity;No upper extremity supported Standing balance-Leahy Scale: Fair                               Pertinent Vitals/Pain Pain Assessment: 0-10 Pain Score: 4  Pain Location: headache Pain Descriptors / Indicators: Headache Pain Intervention(s): Monitored during session    Home Living Family/patient expects to be discharged to:: Private residence Living Arrangements: Spouse/significant other;Children;Other relatives Available Help at Discharge: Family;Available 24 hours/day Type of Home: House Home Access: Stairs to enter Entrance Stairs-Rails: None Entrance Stairs-Number of Steps: 3 Home Layout: One level Home Equipment: Walker - 2 wheels;Cane - single point      Prior Function Level of Independence: Independent               Hand Dominance   Dominant Hand: Right    Extremity/Trunk Assessment   Upper Extremity Assessment Upper Extremity Assessment: Overall WFL for tasks assessed    Lower Extremity Assessment Lower Extremity Assessment: Overall WFL for tasks assessed    Cervical / Trunk Assessment Cervical / Trunk Assessment: Normal  Communication   Communication: No difficulties  Cognition Arousal/Alertness: Awake/alert Behavior During Therapy: WFL for tasks assessed/performed Overall Cognitive Status: Within Functional Limits for tasks assessed  General Comments General comments (skin integrity, edema, etc.): Pt with significant L eyelid droop and blurred vision in L eye (present prior to surgery as well)    Exercises      Assessment/Plan    PT Assessment Patient needs continued PT services  PT Problem List Decreased activity tolerance;Decreased balance;Decreased mobility;Decreased coordination;Decreased knowledge of use of DME;Decreased safety awareness;Pain       PT Treatment Interventions DME instruction;Gait training;Functional mobility training;Stair training;Therapeutic activities;Therapeutic exercise;Balance training;Neuromuscular re-education;Patient/family education    PT Goals (Current goals can be found in the Care Plan section)  Acute Rehab PT Goals Patient Stated Goal: return home today PT Goal Formulation: With patient Time For Goal Achievement: 09/18/16 Potential to Achieve Goals: Good    Frequency Min 3X/week   Barriers to discharge        Co-evaluation               End of Session Equipment Utilized During Treatment: Gait belt Activity Tolerance: Patient limited by pain;Patient limited by fatigue Patient left: in chair;with call bell/phone within reach Nurse Communication: Mobility status PT Visit Diagnosis: Unsteadiness on feet (R26.81);Other abnormalities of gait and mobility (R26.89)    Time: 1610-9604 PT Time Calculation (min) (ACUTE ONLY): 12 min   Charges:   PT Evaluation $PT Eval Moderate Complexity: 1 Procedure     PT G Codes:        Sherie Don, PT, DPT [place] 09/04/2016, 1:13 PM

## 2016-09-08 ENCOUNTER — Emergency Department (HOSPITAL_COMMUNITY)
Admission: EM | Admit: 2016-09-08 | Discharge: 2016-09-08 | Disposition: A | Discharge status: Home / Self Care | Payer: Medicaid Other | Attending: Emergency Medicine | Admitting: Emergency Medicine

## 2016-09-08 ENCOUNTER — Emergency Department (HOSPITAL_COMMUNITY): Payer: Medicaid Other

## 2016-09-08 DIAGNOSIS — Z7984 Long term (current) use of oral hypoglycemic drugs: Secondary | ICD-10-CM | POA: Diagnosis not present

## 2016-09-08 DIAGNOSIS — R569 Unspecified convulsions: Secondary | ICD-10-CM

## 2016-09-08 DIAGNOSIS — G40909 Epilepsy, unspecified, not intractable, without status epilepticus: Secondary | ICD-10-CM | POA: Diagnosis not present

## 2016-09-08 DIAGNOSIS — E1140 Type 2 diabetes mellitus with diabetic neuropathy, unspecified: Secondary | ICD-10-CM | POA: Diagnosis not present

## 2016-09-08 DIAGNOSIS — Z87891 Personal history of nicotine dependence: Secondary | ICD-10-CM | POA: Diagnosis not present

## 2016-09-08 DIAGNOSIS — Z955 Presence of coronary angioplasty implant and graft: Secondary | ICD-10-CM | POA: Diagnosis not present

## 2016-09-08 DIAGNOSIS — Z79899 Other long term (current) drug therapy: Secondary | ICD-10-CM | POA: Insufficient documentation

## 2016-09-08 DIAGNOSIS — Z8673 Personal history of transient ischemic attack (TIA), and cerebral infarction without residual deficits: Secondary | ICD-10-CM | POA: Diagnosis not present

## 2016-09-08 DIAGNOSIS — I10 Essential (primary) hypertension: Secondary | ICD-10-CM | POA: Diagnosis not present

## 2016-09-08 LAB — COMPREHENSIVE METABOLIC PANEL
ALBUMIN: 3.1 g/dL — AB (ref 3.5–5.0)
ALK PHOS: 61 U/L (ref 38–126)
ALT: 22 U/L (ref 17–63)
ANION GAP: 10 (ref 5–15)
AST: 18 U/L (ref 15–41)
BUN: 11 mg/dL (ref 6–20)
CALCIUM: 8.6 mg/dL — AB (ref 8.9–10.3)
CO2: 26 mmol/L (ref 22–32)
CREATININE: 0.95 mg/dL (ref 0.61–1.24)
Chloride: 96 mmol/L — ABNORMAL LOW (ref 101–111)
GFR calc Af Amer: >60 mL/min (ref >60)
GFR calc non Af Amer: >60 mL/min (ref >60)
Glucose, Bld: 313 mg/dL — ABNORMAL HIGH (ref 65–99)
Potassium: 4 mmol/L (ref 3.5–5.1)
Sodium: 132 mmol/L — ABNORMAL LOW (ref 135–145)
Total Bilirubin: 0.4 mg/dL (ref 0.3–1.2)
Total Protein: 6.3 g/dL — ABNORMAL LOW (ref 6.5–8.1)

## 2016-09-08 LAB — CBC WITH DIFFERENTIAL/PLATELET
BASOS PCT: 0 %
Basophils Absolute: 0 10*3/uL (ref 0.0–0.1)
Eosinophils Absolute: 0.2 10*3/uL (ref 0.0–0.7)
Eosinophils Relative: 1 %
HEMATOCRIT: 33 % — AB (ref 39.0–52.0)
HEMOGLOBIN: 11.6 g/dL — AB (ref 13.0–17.0)
Lymphocytes Relative: 8 %
Lymphs Abs: 1.6 10*3/uL (ref 0.7–4.0)
MCH: 26.1 pg (ref 26.0–34.0)
MCHC: 35.2 g/dL (ref 30.0–36.0)
MCV: 74.3 fL — ABNORMAL LOW (ref 78.0–100.0)
MONO ABS: 1.4 10*3/uL — AB (ref 0.1–1.0)
MONOS PCT: 7 %
Neutro Abs: 17.3 10*3/uL — ABNORMAL HIGH (ref 1.7–7.7)
Neutrophils Relative %: 85 %
Platelets: 324 10*3/uL (ref 150–400)
RBC: 4.44 MIL/uL (ref 4.22–5.81)
RDW: 14.3 % (ref 11.5–15.5)
WBC: 20.5 10*3/uL — ABNORMAL HIGH (ref 4.0–10.5)

## 2016-09-08 MED ORDER — SODIUM CHLORIDE 0.9 % IV SOLN
1000.0000 mg | Freq: Once | INTRAVENOUS | Status: AC
  Administered 2016-09-08: 1000 mg via INTRAVENOUS
  Filled 2016-09-08: qty 10

## 2016-09-08 NOTE — ED Triage Notes (Signed)
Pt brought in by [facility]. Pt had unwitnessed seizure at home in bathroom. Unknown whether pt had fall. Pt was found in bathroom non verbal and not following commands. Pt hx of multiple seizures with brain surgery about 1 month ago. Staples intact on head. Pt is non verbal at this time not responding verbally. Pt following some simple commands. Per EMS pt become aggressive following seizure activity. EMS stated that he had 2 seizures while standing in bathroom with left sided gaze.

## 2016-09-08 NOTE — ED Notes (Signed)
Patient transported to CT scan . 

## 2016-09-08 NOTE — ED Notes (Signed)
Patient transported to CT 

## 2016-09-08 NOTE — ED Provider Notes (Signed)
[facility] DEPT Provider Note   CSN: [id number] Arrival date & time: 09/08/16  0030  By signing my name below, I, [name], attest that this documentation has been prepared under the direction and in the presence of Orpah Greek, MD. Electronically Signed: [name], ED Scribe. 09/08/16. 12:46 AM.  LEVEL V CAVEAT: HPI and ROS limited due to being non-verbal s/p a seizure.   History   Chief Complaint Chief Complaint  Patient presents with  . Seizures    HPI Thomas [name] is a 61 y.o. male with a PMHx of seizures, and brain surgery, BIB EMS who presents to the Emergency Department s/p an unwitnessed seizure PTA. Pt was found in the bathroom at home, non-verbal and not following commands. It is unknown if pt had fallen. Per EMS, pt can become aggressive following seizure activity. EMS stated that he had 2 seizures while standing in the bathroom with left sided gaze. He is following some simple commands.  The history is provided by the patient, medical records and the EMS personnel. The history is limited by the condition of the patient. No language interpreter was used.    Past Medical History:  Diagnosis Date  . Asthma    as a child  . Brain tumor ([place])    frontal  . Confusion    occasionally  . Diabetes mellitus without complication ([place])    takes Metformin daily.  . Dizziness   . Dyspnea    pt states d/t weight  . Epilepsy ([place])    takes Keppra daily  . GERD (gastroesophageal reflux disease)    takes Omeprazole daily  . Headache   . Hyperlipidemia    takes Atorvastatin daily  . Hypertension    takes Lotrel daily  . Peripheral edema    takes Lasix daily  . Peripheral neuropathy    takes Gabapentin daily  . Pneumonia 3 yrs ago   hx of  . Seizures ([place])   . Sleep apnea   . Urinary frequency     Patient Active Problem List   Diagnosis Date Noted  . S/P craniotomy 09/01/2016  . Meningioma ([place]) 09/01/2016  . Trauma   . Diabetes mellitus  type II, controlled ([place])   . Essential hypertension   . Aspiration pneumonia ([place])   . Acute encephalopathy 04/09/2014  . Acute ischemic left MCA stroke ([place]) 04/09/2014  . Acute respiratory failure with hypoxia ([place]) 04/09/2014  . Lactic acidosis 04/09/2014  . Hyperammonemia ([place]) 04/09/2014    Past Surgical History:  Procedure Laterality Date  . CARDIAC CATHETERIZATION  7 yrs ago  . CRANIOTOMY N/A 09/01/2016   Procedure: CRANIOTOMY TUMOR  LEFT PTERIONAL;  Surgeon: Ashok Pall, MD;  Location: [facility];  Service: Neurosurgery;  Laterality: N/A;  . cyst removed from chest      as a child  . NO PAST SURGERIES         Home Medications    Prior to Admission medications   Medication Sig Start Date End Date Taking? Authorizing Provider  acetaminophen (TYLENOL) 500 MG tablet Take 1,000 mg by mouth every 6 (six) hours as needed for headache (pain).     Historical Provider, MD  albuterol (PROVENTIL HFA;VENTOLIN HFA) 108 (90 BASE) MCG/ACT inhaler Inhale 2 puffs into the lungs every 4 (four) hours as needed for wheezing or shortness of breath. 04/09/15   Hannah Muthersbaugh, PA-C  amLODipine-benazepril (LOTREL) 10-20 MG capsule Take 1 capsule by mouth daily. 04/06/16   Varney Biles, MD  atorvastatin (  LIPITOR) 20 MG tablet Take 20 mg by mouth daily at 6 PM.     Historical Provider, MD  calcium carbonate (TUMS - DOSED IN MG ELEMENTAL CALCIUM) 500 MG chewable tablet Chew 1-2 tablets by mouth 2 (two) times daily as needed for indigestion or heartburn (DEPENDS ON INDIGESTION IF TAKES 1-2 TABLETS).    Historical Provider, MD  Chlorpheniramine-Acetaminophen (CORICIDIN HBP COLD/FLU PO) Take 2 tablets by mouth 2 (two) times daily as needed (COLD SYMPTOMS).    Historical Provider, MD  furosemide (LASIX) 20 MG tablet Take 2 tablets (40 mg total) by mouth daily. 04/06/16   Varney Biles, MD  furosemide (LASIX) 40 MG tablet Take 1 tablet (40 mg total) by mouth daily. Patient not taking: Reported on  05/25/2016 02/07/16   Dorie Rank, MD  gabapentin (NEURONTIN) 300 MG capsule Take 1 capsule (300 mg total) by mouth 3 (three) times daily. Patient taking differently: Take 300 mg by mouth 3 (three) times daily as needed (PAIN).  04/06/16   Varney Biles, MD  HYDROcodone-acetaminophen (NORCO/VICODIN) 5-325 MG tablet Take 1 tablet by mouth every 4 (four) hours as needed for moderate pain. 09/04/16   Vincent J Costella, PA-C  Lacosamide (VIMPAT) 100 MG TABS Take 1 tablet (100 mg total) by mouth 2 (two) times daily. Patient not taking: Reported on 05/25/2016 04/06/16   Varney Biles, MD  levETIRAcetam (KEPPRA) 1000 MG tablet Take 1,000 mg by mouth 2 (two) times daily. 07/28/16   Historical Provider, MD  levETIRAcetam (KEPPRA) 500 MG tablet Take 3 tablets (1,500 mg total) by mouth 2 (two) times daily. 05/25/16 06/24/16  Duffy Bruce, MD  metFORMIN (GLUCOPHAGE) 500 MG tablet Take 1 tablet (500 mg total) by mouth 2 (two) times daily with a meal. 04/06/16   Varney Biles, MD  methylPREDNISolone (MEDROL) 4 MG TBPK tablet Take according to package inserts 09/04/16   Vista Mink Costella, PA-C  omeprazole (PRILOSEC) 20 MG capsule Take 20 mg by mouth daily. 08/10/16   Historical Provider, MD  UNABLE TO FIND 2 (two) times a week. Herbal Life Protein Powder: Mix into a shake and drink once a day    Historical Provider, MD    Family History Family History  Problem Relation Age of Onset  . Family history unknown: Yes    Social History Social History  Substance Use Topics  . Smoking status: Former Research scientist (life sciences)  . Smokeless tobacco: Never Used     Comment: quit smoking 35 yrs ago  . Alcohol use No     Allergies   No known allergies   Review of Systems Review of Systems  Unable to perform ROS: Patient nonverbal  Neurological: Positive for seizures.     Physical Exam Updated Vital Signs BP 131/80   Pulse (!) 35   Temp 98.6 F (37 C) (Oral)   Resp 19   SpO2 100%   Physical Exam  Constitutional: He  appears well-developed and well-nourished. No distress.  HENT:  Head: Normocephalic and atraumatic.  Right Ear: Hearing normal.  Left Ear: Hearing normal.  Nose: Nose normal.  Mouth/Throat: Oropharynx is clear and moist and mucous membranes are normal.  Eyes: Conjunctivae and EOM are normal. Pupils are equal, round, and reactive to light.  Neck: Normal range of motion. Neck supple.  Cardiovascular: Regular rhythm, S1 normal and S2 normal.  Exam reveals no gallop and no friction rub.   No murmur heard. Pulmonary/Chest: Effort normal and breath sounds normal. No respiratory distress. He exhibits no tenderness.  Abdominal: Soft. Normal  appearance and bowel sounds are normal. There is no hepatosplenomegaly. There is no tenderness. There is no rebound, no guarding, no tenderness at McBurney's point and negative Murphy's sign. No hernia.  Musculoskeletal: Normal range of motion.  Neurological: He is alert. He has normal strength. No cranial nerve deficit or sensory deficit. Coordination normal. GCS eye subscore is 4. GCS verbal subscore is 5. GCS motor subscore is 6.  Non communicative.  Not following any commands at the moment.  Skin: Skin is warm, dry and intact. No rash noted. No cyanosis.  Psychiatric: He has a normal mood and affect. His speech is normal and behavior is normal. Thought content normal.  Nursing note and vitals reviewed.    ED Treatments / Results  DIAGNOSTIC STUDIES: Oxygen Saturation is 91% on RA, normal by my interpretation.   Labs (all labs ordered are listed, but only abnormal results are displayed) Labs Reviewed  CBC WITH DIFFERENTIAL/PLATELET - Abnormal; Notable for the following:       Result Value   WBC 20.5 (*)    Hemoglobin 11.6 (*)    HCT 33.0 (*)    MCV 74.3 (*)    Neutro Abs 17.3 (*)    Monocytes Absolute 1.4 (*)    All other components within normal limits  COMPREHENSIVE METABOLIC PANEL - Abnormal; Notable for the following:    Sodium 132 (*)     Chloride 96 (*)    Glucose, Bld 313 (*)    Calcium 8.6 (*)    Total Protein 6.3 (*)    Albumin 3.1 (*)    All other components within normal limits    EKG  EKG Interpretation None       Radiology Ct Head Wo Contrast  Result Date: 09/08/2016 CLINICAL DATA:  Unwitnessed seizure EXAM: CT HEAD WITHOUT CONTRAST TECHNIQUE: Contiguous axial images were obtained from the base of the skull through the vertex without intravenous contrast. COMPARISON:  MRI 07/16/2016, CT brain 05/25/2016 FINDINGS: Brain: Interim removal of left anterior extra-axial mass/meningioma. Small to moderate pneumocephalus anteriorly with additional small foci of gas posteriorly and at the vertex. Thin subdural fluid with associated gas collections at the surgical site. Small scattered foci of hemorrhage along the anterior left temporal lobe and the left frontal lobe. Edema within the left frontal lobe adjacent to the surgical site. No midline shift. The ventricles do not appear significantly enlarged. Vascular: No hyperdense vessels.  Carotid artery calcifications. Skull: Interval left frontal craniotomy. Sinuses/Orbits: Gas within the superior left orbit, related to craniotomy. Bony defect in the superolateral orbit is contiguous with the left frontal craniotomy. Globe appears grossly unremarkable. Fluid level in the left sphenoid sinus. Mucous retention cyst in the right maxillary sinus. Mucosal thickening in the ethmoid sinuses. Other: Left frontal and parietal scalp swelling with soft tissue gas present. IMPRESSION: 1. Interim resection of left frontal convexity meningioma. Small foci of extra-axial blood along the left frontal and anterior temporal lobes in addition to mild to moderate pneumocephalus presumably related to recent history of surgery. Thin subdural collection with associated air along the left frontal convexity. No change in edema within the left frontal lobe adjacent to the surgical cavity. 2. Left frontal  craniotomy changes with surgical defect in the superolateral left orbit, resulting in a small amount of gas in the superior left orbit. Electronically Signed   By: Donavan Foil M.D.   On: 09/08/2016 04:02    Procedures Procedures (including critical care time)  Medications Ordered in ED Medications  levETIRAcetam (  KEPPRA) 1,000 mg in sodium chloride 0.9 % 100 mL IVPB (0 mg Intravenous Stopped 09/08/16 0113)     Initial Impression / Assessment and Plan / ED Course  I have reviewed the triage vital signs and the nursing notes.  Pertinent labs & imaging results that were available during my care of the patient were reviewed by me and considered in my medical decision making (see chart for details).     Patient presents to the emergency department for evaluation of seizure. Patient has a long-standing seizure disorder history. He takes Keppra, has not missed any doses. Patient had a seizure tonight prior to arrival to the ER. Patient underwent resection of meningioma last week. Surgery was performed by Dr. Cyndy Freeze.  Patient was postictal on arrival. He has returned to his normal baseline. He was given additional IV Keppra. Patient was observed for an extended period of time, no further seizure activity. CT head shows postsurgical changes, no acute abnormality.  I discussed the patient briefly with Dr. Christella Noa, he did not indicate that the patient would require any significant intervention.  I did discuss with the patient the possibility of admission just for observation. He declines. Patient reports that he feels back to his baseline, is scheduled to see Dr. Christella Noa in the office. He does not wish to be admitted at this time. As patient has continued to do well, will be discharged, return for any further seizure activity. He reports that he has all of his medications at home.  Final Clinical Impressions(s) / ED Diagnoses   Final diagnoses:  Seizure [facility])    New Prescriptions New  Prescriptions   No medications on file   I personally performed the services described in this documentation, which was scribed in my presence. The recorded information has been reviewed and is accurate.     Orpah Greek, MD 09/08/16 [phone]

## 2016-09-08 NOTE — ED Notes (Signed)
Returned from CT at this time.

## 2016-10-08 NOTE — Addendum Note (Signed)
Addendum  created 10/08/16 1203 by Effie Berkshire, MD   Sign clinical note

## 2016-11-28 ENCOUNTER — Other Ambulatory Visit: Payer: Self-pay

## 2016-11-28 ENCOUNTER — Emergency Department (HOSPITAL_COMMUNITY): Payer: Medicaid Other

## 2016-11-28 ENCOUNTER — Encounter (HOSPITAL_COMMUNITY): Payer: Self-pay | Admitting: *Deleted

## 2016-11-28 ENCOUNTER — Emergency Department (HOSPITAL_COMMUNITY)
Admission: EM | Admit: 2016-11-28 | Discharge: 2016-11-28 | Disposition: A | Discharge status: Home / Self Care | Payer: Medicaid Other | Attending: Emergency Medicine | Admitting: Emergency Medicine

## 2016-11-28 DIAGNOSIS — Z79899 Other long term (current) drug therapy: Secondary | ICD-10-CM | POA: Diagnosis not present

## 2016-11-28 DIAGNOSIS — J45909 Unspecified asthma, uncomplicated: Secondary | ICD-10-CM | POA: Insufficient documentation

## 2016-11-28 DIAGNOSIS — R05 Cough: Secondary | ICD-10-CM | POA: Diagnosis present

## 2016-11-28 DIAGNOSIS — I10 Essential (primary) hypertension: Secondary | ICD-10-CM | POA: Diagnosis not present

## 2016-11-28 DIAGNOSIS — Z7984 Long term (current) use of oral hypoglycemic drugs: Secondary | ICD-10-CM | POA: Diagnosis not present

## 2016-11-28 DIAGNOSIS — Z87891 Personal history of nicotine dependence: Secondary | ICD-10-CM | POA: Insufficient documentation

## 2016-11-28 DIAGNOSIS — R059 Cough, unspecified: Secondary | ICD-10-CM

## 2016-11-28 LAB — URINALYSIS, ROUTINE W REFLEX MICROSCOPIC
BACTERIA UA: NONE SEEN
BILIRUBIN URINE: NEGATIVE
Glucose, UA: >=500 mg/dL — AB
HGB URINE DIPSTICK: NEGATIVE
KETONES UR: NEGATIVE mg/dL
LEUKOCYTES UA: NEGATIVE
NITRITE: NEGATIVE
PH: 5 (ref 5.0–8.0)
Protein, ur: 100 mg/dL — AB
RBC / HPF: NONE SEEN RBC/hpf (ref 0–5)
SPECIFIC GRAVITY, URINE: 1.022 (ref 1.005–1.030)
WBC, UA: NONE SEEN WBC/hpf (ref 0–5)

## 2016-11-28 LAB — I-STAT TROPONIN, ED: Troponin i, poc: 0.01 ng/mL (ref 0.00–0.08)

## 2016-11-28 LAB — CBC WITH DIFFERENTIAL/PLATELET
BASOS ABS: 0.1 10*3/uL (ref 0.0–0.1)
Basophils Relative: 1 %
EOS PCT: 4 %
Eosinophils Absolute: 0.4 10*3/uL (ref 0.0–0.7)
HEMATOCRIT: 38.6 % — AB (ref 39.0–52.0)
Hemoglobin: 13.7 g/dL (ref 13.0–17.0)
LYMPHS ABS: 1.6 10*3/uL (ref 0.7–4.0)
LYMPHS PCT: 17 %
MCH: 25.8 pg — ABNORMAL LOW (ref 26.0–34.0)
MCHC: 35.5 g/dL (ref 30.0–36.0)
MCV: 72.7 fL — AB (ref 78.0–100.0)
MONOS PCT: 12 %
Monocytes Absolute: 1.1 10*3/uL — ABNORMAL HIGH (ref 0.1–1.0)
NEUTROS PCT: 66 %
Neutro Abs: 6 10*3/uL (ref 1.7–7.7)
PLATELETS: 284 10*3/uL (ref 150–400)
RBC: 5.31 MIL/uL (ref 4.22–5.81)
RDW: 13.9 % (ref 11.5–15.5)
WBC: 9.2 10*3/uL (ref 4.0–10.5)

## 2016-11-28 LAB — COMPREHENSIVE METABOLIC PANEL
ALT: 38 U/L (ref 17–63)
ANION GAP: 11 (ref 5–15)
AST: 24 U/L (ref 15–41)
Albumin: 3.5 g/dL (ref 3.5–5.0)
Alkaline Phosphatase: 102 U/L (ref 38–126)
BILIRUBIN TOTAL: 0.5 mg/dL (ref 0.3–1.2)
BUN: 10 mg/dL (ref 6–20)
CHLORIDE: 97 mmol/L — AB (ref 101–111)
CO2: 24 mmol/L (ref 22–32)
Calcium: 9.5 mg/dL (ref 8.9–10.3)
Creatinine, Ser: 1.09 mg/dL (ref 0.61–1.24)
Glucose, Bld: 330 mg/dL — ABNORMAL HIGH (ref 65–99)
POTASSIUM: 3.9 mmol/L (ref 3.5–5.1)
Sodium: 132 mmol/L — ABNORMAL LOW (ref 135–145)
TOTAL PROTEIN: 7.2 g/dL (ref 6.5–8.1)

## 2016-11-28 LAB — I-STAT CG4 LACTIC ACID, ED: LACTIC ACID, VENOUS: 1.72 mmol/L (ref 0.5–1.9)

## 2016-11-28 MED ORDER — BENZONATATE 100 MG PO CAPS: 100.0000 mg | ORAL_CAPSULE | Freq: Three times a day (TID) | ORAL | 0 refills | Status: DC

## 2016-11-28 MED ORDER — ALBUTEROL SULFATE (2.5 MG/3ML) 0.083% IN NEBU
5.0000 mg | INHALATION_SOLUTION | Freq: Once | RESPIRATORY_TRACT | Status: AC
  Administered 2016-11-28: 5 mg via RESPIRATORY_TRACT
  Filled 2016-11-28: qty 6

## 2016-11-28 NOTE — ED Provider Notes (Signed)
[facility] DEPT Provider Note   CSN: [id number] Arrival date & time: 11/28/16  1910     History   Chief Complaint Chief Complaint  Patient presents with  . Cough    HPI Thomas [name] is a 61 y.o. male.  The history is provided by the patient.  Illness  This is a new problem. The current episode started more than 2 days ago. The problem occurs hourly. The problem has not changed since onset.Pertinent negatives include no chest pain, no abdominal pain, no headaches and no shortness of breath. Associated symptoms comments: Cough . Nothing aggravates the symptoms. Nothing relieves the symptoms. He has tried nothing for the symptoms.    Past Medical History:  Diagnosis Date  . Asthma    as a child  . 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 Confusion    occasionally  . Diabetes mellitus without complication ([place])    takes Metformin daily.  . Dizziness   . Dyspnea    pt states d/t weight  . Epilepsy ([place])    takes Keppra daily  . GERD (gastroesophageal reflux disease)    takes Omeprazole daily  . Headache   . Hyperlipidemia    takes Atorvastatin daily  . Hypertension    takes Lotrel daily  . Peripheral edema    takes Lasix daily  . Peripheral neuropathy    takes Gabapentin daily  . Pneumonia 3 yrs ago   hx of  . Seizures ([place])   . Sleep apnea   . Urinary frequency     Patient Active Problem List   Diagnosis Date Noted  . S/P craniotomy 09/01/2016  . Meningioma ([place]) 09/01/2016  . Trauma   . Diabetes mellitus type II, controlled ([place])   . Essential hypertension   . Aspiration pneumonia ([place])   . Acute encephalopathy 04/09/2014  . Acute ischemic left MCA stroke ([place]) 04/09/2014  . Acute respiratory failure with hypoxia ([place]) 04/09/2014  . Lactic acidosis 04/09/2014  . Hyperammonemia ([place]) 04/09/2014    Past Surgical History:  Procedure Laterality Date  . CARDIAC CATHETERIZATION  7 yrs ago  . CRANIOTOMY N/A 09/01/2016   Procedure: CRANIOTOMY TUMOR  LEFT  PTERIONAL;  Surgeon: Ashok Pall, MD;  Location: [facility];  Service: Neurosurgery;  Laterality: N/A;  . cyst removed from chest      as a child  . NO PAST SURGERIES         Home Medications    Prior to Admission medications   Medication Sig Start Date End Date Taking? Authorizing Provider  acetaminophen (TYLENOL) 500 MG tablet Take 1,000 mg by mouth every 6 (six) hours as needed for headache (pain).     [provider]  albuterol (PROVENTIL HFA;VENTOLIN HFA) 108 (90 BASE) MCG/ACT inhaler Inhale 2 puffs into the lungs every 4 (four) hours as needed for wheezing or shortness of breath. 04/09/15   Muthersbaugh, Jarrett Soho, PA-C  amLODipine-benazepril (LOTREL) 10-20 MG capsule Take 1 capsule by mouth daily. 04/06/16   Varney Biles, MD  atorvastatin (LIPITOR) 20 MG tablet Take 20 mg by mouth daily at 6 PM.     [provider]  benzonatate (TESSALON) 100 MG capsule Take 1 capsule (100 mg total) by mouth every 8 (eight) hours. 11/28/16   Valda Lamb, MD  calcium carbonate (TUMS - DOSED IN MG ELEMENTAL CALCIUM) 500 MG chewable tablet Chew 1-2 tablets by mouth 2 (two) times daily as needed for indigestion or heartburn (DEPENDS ON INDIGESTION IF TAKES 1-2 TABLETS).  [provider]  Chlorpheniramine-Acetaminophen (CORICIDIN HBP COLD/FLU PO) Take 2 tablets by mouth 2 (two) times daily as needed (COLD SYMPTOMS).    [provider]  furosemide (LASIX) 20 MG tablet Take 2 tablets (40 mg total) by mouth daily. 04/06/16   Varney Biles, MD  furosemide (LASIX) 40 MG tablet Take 1 tablet (40 mg total) by mouth daily. Patient not taking: Reported on 05/25/2016 02/07/16   Dorie Rank, MD  gabapentin (NEURONTIN) 300 MG capsule Take 1 capsule (300 mg total) by mouth 3 (three) times daily. Patient taking differently: Take 300 mg by mouth 3 (three) times daily as needed (PAIN).  04/06/16   Varney Biles, MD  HYDROcodone-acetaminophen (NORCO/VICODIN) 5-325 MG tablet Take 1  tablet by mouth every 4 (four) hours as needed for moderate pain. 09/04/16   Costella, Vista Mink, PA-C  Lacosamide (VIMPAT) 100 MG TABS Take 1 tablet (100 mg total) by mouth 2 (two) times daily. Patient not taking: Reported on 05/25/2016 04/06/16   Varney Biles, MD  levETIRAcetam (KEPPRA) 1000 MG tablet Take 1,000 mg by mouth 2 (two) times daily. 07/28/16   [provider]  levETIRAcetam (KEPPRA) 500 MG tablet Take 3 tablets (1,500 mg total) by mouth 2 (two) times daily. 05/25/16 06/24/16  Duffy Bruce, MD  metFORMIN (GLUCOPHAGE) 500 MG tablet Take 1 tablet (500 mg total) by mouth 2 (two) times daily with a meal. 04/06/16   Varney Biles, MD  methylPREDNISolone (MEDROL) 4 MG TBPK tablet Take according to package inserts 09/04/16   Costella, Vista Mink, PA-C  omeprazole (PRILOSEC) 20 MG capsule Take 20 mg by mouth daily. 08/10/16   [provider]  UNABLE TO FIND 2 (two) times a week. Herbal Life Protein Powder: Mix into a shake and drink once a day    [provider]    Family History Family History  Problem Relation Age of Onset  . Family history unknown: Yes    Social History Social History  Substance Use Topics  . Smoking status: Former Research scientist (life sciences)  . Smokeless tobacco: Never Used     Comment: quit smoking 35 yrs ago  . Alcohol use No     Allergies   No known allergies   Review of Systems Review of Systems  Constitutional: Negative for chills and fever.  HENT: Negative for ear pain and sore throat.   Eyes: Negative for pain and visual disturbance.  Respiratory: Positive for cough. Negative for shortness of breath.   Cardiovascular: Negative for chest pain and palpitations.  Gastrointestinal: Negative for abdominal pain and vomiting.  Genitourinary: Negative for dysuria and hematuria.  Musculoskeletal: Negative for arthralgias and back pain.  Skin: Negative for color change and rash.  Neurological: Negative for seizures, syncope and headaches.    Psychiatric/Behavioral: Negative for agitation and behavioral problems.  All other systems reviewed and are negative.    Physical Exam Updated Vital Signs BP 140/81 (BP Location: Right Arm)   Pulse 93   Temp 99.3 F (37.4 C) (Oral)   Resp 19   Ht 5\' 8"  (1.727 m)   Wt (!) 141.5 kg (312 lb)   SpO2 91%   BMI 47.44 kg/m   Physical Exam  Constitutional: He is oriented to person, place, and time. He appears well-developed and well-nourished.  HENT:  Head: Normocephalic and atraumatic.  Mouth/Throat: Oropharynx is clear and moist. No oropharyngeal exudate.  Eyes: Pupils are equal, round, and reactive to light. Conjunctivae and EOM are normal.  Neck: Neck supple. No tracheal deviation present.  Cardiovascular: Normal rate and normal heart sounds.   No murmur heard. Pulmonary/Chest: Effort normal. No respiratory distress.  Mild crackles in RLL  Abdominal: Soft. He exhibits no distension. There is no tenderness.  Musculoskeletal: He exhibits no edema or deformity.  Neurological: He is alert and oriented to person, place, and time. No cranial nerve deficit.  Skin: Skin is warm and dry.  Psychiatric: He has a normal mood and affect.  Nursing note and vitals reviewed.    ED Treatments / Results  Labs (all labs ordered are listed, but only abnormal results are displayed) Labs Reviewed  COMPREHENSIVE METABOLIC PANEL - Abnormal; Notable for the following:       Result Value   Sodium 132 (*)    Chloride 97 (*)    Glucose, Bld 330 (*)    All other components within normal limits  CBC WITH DIFFERENTIAL/PLATELET - Abnormal; Notable for the following:    HCT 38.6 (*)    MCV 72.7 (*)    MCH 25.8 (*)    Monocytes Absolute 1.1 (*)    All other components within normal limits  URINALYSIS, ROUTINE W REFLEX MICROSCOPIC - Abnormal; Notable for the following:    APPearance HAZY (*)    Glucose, UA >=500 (*)    Protein, ur 100 (*)    Squamous Epithelial / LPF 0-5 (*)    All other  components within normal limits  I-STAT CG4 LACTIC ACID, ED  I-STAT TROPONIN, ED  I-STAT CG4 LACTIC ACID, ED    EKG  EKG Interpretation  Date/Time:  Sunday November 28 2016 19:12:58 EDT Ventricular Rate:  102 PR Interval:  166 QRS Duration: 80 QT Interval:  344 QTC Calculation: 448 R Axis:   100 Text Interpretation:  Sinus tachycardia Rightward axis Borderline ECG No acute changes Nonspecific ST and T wave abnormality No significant change since last tracing Confirmed by Varney Biles [phone]) on 11/28/2016 8:34:28 PM       Radiology Dg Chest 2 View  Result Date: 11/28/2016 CLINICAL DATA:  Productive cough with mucus, 4 days duration. EXAM: CHEST  2 VIEW COMPARISON:  04/06/2016 FINDINGS: Artifact overlies the chest. Heart size is normal. Mediastinal shadows are normal. The lungs are clear. No infiltrate, collapse or effusion. No significant bone finding. IMPRESSION: No active cardiopulmonary disease. Electronically Signed   By: Nelson Chimes M.D.   On: 11/28/2016 20:05    Procedures Procedures (including critical care time)  Medications Ordered in ED Medications  albuterol (PROVENTIL) (2.5 MG/3ML) 0.083% nebulizer solution 5 mg (5 mg Nebulization Given 11/28/16 2027)     Initial Impression / Assessment and Plan / ED Course  I have reviewed the triage vital signs and the nursing notes.  Pertinent labs & imaging results that were available during my care of the patient were reviewed by me and considered in my medical decision making (see chart for details).     Patient with a few days of cough that started after sleeping underneath a fan. Endorses occasional production of yellow phlegm during the cough but no shortness of breath, chest pain. Does have some chest tightness whenever he coughs but it is not normally there and only occurs when he coughs. Troponin Negative. Other labs unremarkable including no leukocytosis. Chest x-ray negative for pneumonia. Likely viral upper  respiratory infection versus sleeping underneath the fan irritating his upper airway. Doubt PE, pneumonia  Patient given prescription for Tessalon Perles for his cough and told to follow up with primary care in 1 week  for recheck. He voiced understanding and agreement to the plan was comfortable with discharge home.  Patient was seen with my attending, Dr. Kathrynn Humble, who voiced agreement and oversaw the evaluation and treatment of this patient.   [employer] seismologist was used in the creation of this note. If there are any errors or inconsistencies needing clarification, please contact me directly.   Final Clinical Impressions(s) / ED Diagnoses   Final diagnoses:  Cough    New Prescriptions Discharge Medication List as of 11/28/2016  9:09 PM    START taking these medications   Details  benzonatate (TESSALON) 100 MG capsule Take 1 capsule (100 mg total) by mouth every 8 (eight) hours., Starting Sun 11/28/2016, Print         Valda Lamb, MD 11/28/16 9826    Varney Biles, MD 11/30/16 4158

## 2016-11-28 NOTE — ED Notes (Signed)
Pt reports that he feels as though he has pneumonia. Pt reports he has a history of same and about 3 days ago developed this cough. Pt states this is the same thing that occurred when he had pneumonia the last time.

## 2016-11-28 NOTE — ED Triage Notes (Signed)
Pt c/o productive cough x 2 days with chest tightness.

## 2017-04-01 ENCOUNTER — Emergency Department (HOSPITAL_COMMUNITY): Payer: Medicaid Other

## 2017-04-01 ENCOUNTER — Emergency Department (HOSPITAL_COMMUNITY)
Admission: EM | Admit: 2017-04-01 | Discharge: 2017-04-02 | Disposition: A | Discharge status: Home / Self Care | Payer: Medicaid Other | Attending: Emergency Medicine | Admitting: Emergency Medicine

## 2017-04-01 ENCOUNTER — Other Ambulatory Visit: Payer: Self-pay

## 2017-04-01 ENCOUNTER — Encounter (HOSPITAL_COMMUNITY): Payer: Self-pay

## 2017-04-01 DIAGNOSIS — J45909 Unspecified asthma, uncomplicated: Secondary | ICD-10-CM | POA: Diagnosis not present

## 2017-04-01 DIAGNOSIS — I10 Essential (primary) hypertension: Secondary | ICD-10-CM | POA: Diagnosis not present

## 2017-04-01 DIAGNOSIS — E119 Type 2 diabetes mellitus without complications: Secondary | ICD-10-CM | POA: Insufficient documentation

## 2017-04-01 DIAGNOSIS — Z79899 Other long term (current) drug therapy: Secondary | ICD-10-CM | POA: Diagnosis not present

## 2017-04-01 DIAGNOSIS — R079 Chest pain, unspecified: Secondary | ICD-10-CM | POA: Diagnosis not present

## 2017-04-01 DIAGNOSIS — Z7984 Long term (current) use of oral hypoglycemic drugs: Secondary | ICD-10-CM | POA: Diagnosis not present

## 2017-04-01 DIAGNOSIS — Z87891 Personal history of nicotine dependence: Secondary | ICD-10-CM | POA: Insufficient documentation

## 2017-04-01 LAB — CBC
HEMATOCRIT: 38.8 % — AB (ref 39.0–52.0)
HEMOGLOBIN: 13.3 g/dL (ref 13.0–17.0)
MCH: 25.3 pg — AB (ref 26.0–34.0)
MCHC: 34.3 g/dL (ref 30.0–36.0)
MCV: 73.9 fL — ABNORMAL LOW (ref 78.0–100.0)
Platelets: 308 10*3/uL (ref 150–400)
RBC: 5.25 MIL/uL (ref 4.22–5.81)
RDW: 14.8 % (ref 11.5–15.5)
WBC: 12.4 10*3/uL — ABNORMAL HIGH (ref 4.0–10.5)

## 2017-04-01 LAB — I-STAT TROPONIN, ED
Troponin i, poc: 0 ng/mL (ref 0.00–0.08)
Troponin i, poc: 0.01 ng/mL (ref 0.00–0.08)

## 2017-04-01 LAB — BASIC METABOLIC PANEL
ANION GAP: 8 (ref 5–15)
BUN: 13 mg/dL (ref 6–20)
CO2: 28 mmol/L (ref 22–32)
Calcium: 9 mg/dL (ref 8.9–10.3)
Chloride: 96 mmol/L — ABNORMAL LOW (ref 101–111)
Creatinine, Ser: 1.39 mg/dL — ABNORMAL HIGH (ref 0.61–1.24)
GFR calc Af Amer: >60 mL/min (ref >60)
GFR calc non Af Amer: 54 mL/min — ABNORMAL LOW (ref >60)
GLUCOSE: 422 mg/dL — AB (ref 65–99)
POTASSIUM: 4 mmol/L (ref 3.5–5.1)
Sodium: 132 mmol/L — ABNORMAL LOW (ref 135–145)

## 2017-04-01 LAB — CBG MONITORING, ED: GLUCOSE-CAPILLARY: 382 mg/dL — AB (ref 65–99)

## 2017-04-01 NOTE — ED Triage Notes (Signed)
Per Pt, PT is coming from home with complaints of left-sided chest pressure that woke him up from his sleep at 0500. Reports some dizziness, lightheadedness, and SOB. Denies any Nausea or vomiting or radiation of pain. Reports bilateral leg numbness

## 2017-04-01 NOTE — ED Provider Notes (Signed)
[facility] EMERGENCY DEPARTMENT Provider Note   CSN: [id number] Arrival date & time: 04/01/17  1855     History   Chief Complaint Chief Complaint  Patient presents with  . Chest Pain    HPI Thomas [name] is a 61 y.o. male.  Patient is a 62 year old male with past medical history of obesity, diabetes, hypertension, and recent excision of a benign brain tumor.  He presents for evaluation of chest discomfort.  This started this morning shortly after waking from sleep.  It began in the absence of any injury or trauma.  He denies shortness of breath, nausea, diaphoresis, or radiation to the arm or jaw.  He denies any recent exertional symptoms.  He does have multiple risk factors, however no personal history of coronary artery disease.  He does report that he had a stress test "a couple years ago" that was unremarkable.   The history is provided by the patient.  Chest Pain   This is a new problem. Episode onset: this morning. The problem occurs constantly. The problem has been gradually worsening. Pain location: Left upper chest. The pain is moderate. The quality of the pain is described as pressure-like. The pain does not radiate. Pertinent negatives include no cough, no diaphoresis, no dizziness, no irregular heartbeat, no nausea and no shortness of breath. He has tried nothing for the symptoms. Risk factors include obesity and male gender.    Past Medical History:  Diagnosis Date  . Asthma    as a child  . 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 Confusion    occasionally  . Diabetes mellitus without complication ([place])    takes Metformin daily.  . Dizziness   . Dyspnea    pt states d/t weight  . Epilepsy ([place])    takes Keppra daily  . GERD (gastroesophageal reflux disease)    takes Omeprazole daily  . Headache   . Hyperlipidemia    takes Atorvastatin daily  . Hypertension    takes Lotrel daily  . Peripheral edema    takes Lasix daily  . Peripheral neuropathy     takes Gabapentin daily  . Pneumonia 3 yrs ago   hx of  . Seizures ([place])   . Sleep apnea   . Urinary frequency     Patient Active Problem List   Diagnosis Date Noted  . S/P craniotomy 09/01/2016  . Meningioma ([place]) 09/01/2016  . Trauma   . Diabetes mellitus type II, controlled ([place])   . Essential hypertension   . Aspiration pneumonia ([place])   . Acute encephalopathy 04/09/2014  . Acute ischemic left MCA stroke ([place]) 04/09/2014  . Acute respiratory failure with hypoxia ([place]) 04/09/2014  . Lactic acidosis 04/09/2014  . Hyperammonemia ([place]) 04/09/2014    Past Surgical History:  Procedure Laterality Date  . CARDIAC CATHETERIZATION  7 yrs ago  . CRANIOTOMY N/A 09/01/2016   Procedure: CRANIOTOMY TUMOR  LEFT PTERIONAL;  Surgeon: Ashok Pall, MD;  Location: [facility];  Service: Neurosurgery;  Laterality: N/A;  . cyst removed from chest      as a child  . NO PAST SURGERIES         Home Medications    Prior to Admission medications   Medication Sig Start Date End Date Taking? Authorizing Provider  acetaminophen (TYLENOL) 500 MG tablet Take 1,000 mg by mouth every 6 (six) hours as needed for headache (pain).     [provider]  albuterol (PROVENTIL HFA;VENTOLIN HFA) 108 (  90 BASE) MCG/ACT inhaler Inhale 2 puffs into the lungs every 4 (four) hours as needed for wheezing or shortness of breath. 04/09/15   Muthersbaugh, Jarrett Soho, PA-C  amLODipine-benazepril (LOTREL) 10-20 MG capsule Take 1 capsule by mouth daily. 04/06/16   Varney Biles, MD  atorvastatin (LIPITOR) 20 MG tablet Take 20 mg by mouth daily at 6 PM.     [provider]  benzonatate (TESSALON) 100 MG capsule Take 1 capsule (100 mg total) by mouth every 8 (eight) hours. 11/28/16   Valda Lamb, MD  calcium carbonate (TUMS - DOSED IN MG ELEMENTAL CALCIUM) 500 MG chewable tablet Chew 1-2 tablets by mouth 2 (two) times daily as needed for indigestion or heartburn (DEPENDS ON INDIGESTION IF TAKES 1-2 TABLETS).     [provider]  Chlorpheniramine-Acetaminophen (CORICIDIN HBP COLD/FLU PO) Take 2 tablets by mouth 2 (two) times daily as needed (COLD SYMPTOMS).    [provider]  furosemide (LASIX) 20 MG tablet Take 2 tablets (40 mg total) by mouth daily. 04/06/16   Varney Biles, MD  furosemide (LASIX) 40 MG tablet Take 1 tablet (40 mg total) by mouth daily. Patient not taking: Reported on 05/25/2016 02/07/16   Dorie Rank, MD  gabapentin (NEURONTIN) 300 MG capsule Take 1 capsule (300 mg total) by mouth 3 (three) times daily. Patient taking differently: Take 300 mg by mouth 3 (three) times daily as needed (PAIN).  04/06/16   Varney Biles, MD  HYDROcodone-acetaminophen (NORCO/VICODIN) 5-325 MG tablet Take 1 tablet by mouth every 4 (four) hours as needed for moderate pain. 09/04/16   Costella, Vista Mink, PA-C  Lacosamide (VIMPAT) 100 MG TABS Take 1 tablet (100 mg total) by mouth 2 (two) times daily. Patient not taking: Reported on 05/25/2016 04/06/16   Varney Biles, MD  levETIRAcetam (KEPPRA) 1000 MG tablet Take 1,000 mg by mouth 2 (two) times daily. 07/28/16   [provider]  levETIRAcetam (KEPPRA) 500 MG tablet Take 3 tablets (1,500 mg total) by mouth 2 (two) times daily. 05/25/16 06/24/16  Duffy Bruce, MD  metFORMIN (GLUCOPHAGE) 500 MG tablet Take 1 tablet (500 mg total) by mouth 2 (two) times daily with a meal. 04/06/16   Varney Biles, MD  methylPREDNISolone (MEDROL) 4 MG TBPK tablet Take according to package inserts 09/04/16   Costella, Vista Mink, PA-C  omeprazole (PRILOSEC) 20 MG capsule Take 20 mg by mouth daily. 08/10/16   [provider]  UNABLE TO FIND 2 (two) times a week. Herbal Life Protein Powder: Mix into a shake and drink once a day    [provider]    Family History Family History  Family history unknown: Yes    Social History Social History   Tobacco Use  . Smoking status: Former Research scientist (life sciences)  . Smokeless tobacco: Never Used  . Tobacco  comment: quit smoking 35 yrs ago  Substance Use Topics  . Alcohol use: No  . Drug use: No     Allergies   No known allergies   Review of Systems Review of Systems  Constitutional: Negative for diaphoresis.  Respiratory: Negative for cough and shortness of breath.   Cardiovascular: Positive for chest pain.  Gastrointestinal: Negative for nausea.  Neurological: Negative for dizziness.  All other systems reviewed and are negative.    Physical Exam Updated Vital Signs BP 127/67   Pulse 84   Temp 97.8 F (36.6 C) (Oral)   Resp 20   Ht 5\' 8"  (1.727 m)   Wt (!) 141.5 kg (312 lb)  SpO2 96%   BMI 47.44 kg/m   Physical Exam  Constitutional: He is oriented to person, place, and time. He appears well-developed and well-nourished. No distress.  HENT:  Head: Normocephalic and atraumatic.  Mouth/Throat: Oropharynx is clear and moist.  Neck: Normal range of motion. Neck supple.  Cardiovascular: Normal rate and regular rhythm. Exam reveals no friction rub.  No murmur heard. There is some tenderness to the left upper chest wall.  This does seem to reproduce his discomfort.  Pulmonary/Chest: Effort normal and breath sounds normal. No respiratory distress. He has no wheezes. He has no rales.  Abdominal: Soft. Bowel sounds are normal. He exhibits no distension. There is no tenderness.  Musculoskeletal: Normal range of motion. He exhibits no edema.  Neurological: He is alert and oriented to person, place, and time. Coordination normal.  Skin: Skin is warm and dry. He is not diaphoretic.  Nursing note and vitals reviewed.    ED Treatments / Results  Labs (all labs ordered are listed, but only abnormal results are displayed) Labs Reviewed  BASIC METABOLIC PANEL - Abnormal; Notable for the following components:      Result Value   Sodium 132 (*)    Chloride 96 (*)    Glucose, Bld 422 (*)    Creatinine, Ser 1.39 (*)    GFR calc non Af Amer 54 (*)    All other components within  normal limits  CBC - Abnormal; Notable for the following components:   WBC 12.4 (*)    HCT 38.8 (*)    MCV 73.9 (*)    MCH 25.3 (*)    All other components within normal limits  I-STAT TROPONIN, ED  I-STAT TROPONIN, ED  CBG MONITORING, ED    EKG  EKG Interpretation  Date/Time:  Friday April 01 2017 18:59:34 EST Ventricular Rate:  90 PR Interval:  170 QRS Duration: 82 QT Interval:  386 QTC Calculation: 472 R Axis:   68 Text Interpretation:  Normal sinus rhythm Normal ECG Confirmed by Lacretia Leigh ([id number]) on 04/01/2017 10:20:26 PM       Radiology Dg Chest 2 View  Result Date: 04/01/2017 CLINICAL DATA:  Chest pain. EXAM: CHEST  2 VIEW COMPARISON:  Radiographs of November 28, 2016. FINDINGS: The heart size and mediastinal contours are within normal limits. Both lungs are clear. No pneumothorax or pleural effusion is noted. The visualized skeletal structures are unremarkable. IMPRESSION: No active cardiopulmonary disease. Electronically Signed   By: Marijo Conception, M.D.   On: 04/01/2017 19:53    Procedures Procedures (including critical care time)  Medications Ordered in ED Medications - No data to display   Initial Impression / Assessment and Plan / ED Course  I have reviewed the triage vital signs and the nursing notes.  Pertinent labs & imaging results that were available during my care of the patient were reviewed by me and considered in my medical decision making (see chart for details).  Patient with no prior cardiac history presenting with complaints of pressure in his left upper chest since this morning.  His EKG is unchanged and troponin x2 is negative.  His symptoms are reproducible with palpation and inspiration.  There is no hypoxia and I doubt PE.  At this point, I feel as though discharge with as needed follow-up is appropriate.  Final Clinical Impressions(s) / ED Diagnoses   Final diagnoses:  None    ED Discharge Orders    None       Amandine Covino,  Nathaneil Canary, MD 04/02/17 5537

## 2017-04-02 NOTE — Discharge Instructions (Signed)
Follow-up with your primary doctor or cardiologist if symptoms are not improving in the next 2-3 days.  Return to the emergency department in the meantime if symptoms significantly worsen or change.

## 2017-08-24 ENCOUNTER — Other Ambulatory Visit: Payer: Self-pay | Admitting: Neurology

## 2017-08-24 DIAGNOSIS — G40219 Localization-related (focal) (partial) symptomatic epilepsy and epileptic syndromes with complex partial seizures, intractable, without status epilepticus: Secondary | ICD-10-CM

## 2017-10-10 ENCOUNTER — Other Ambulatory Visit (HOSPITAL_COMMUNITY): Payer: Self-pay | Admitting: *Deleted

## 2017-10-10 ENCOUNTER — Ambulatory Visit (HOSPITAL_COMMUNITY): Payer: Self-pay

## 2017-10-10 ENCOUNTER — Ambulatory Visit (HOSPITAL_COMMUNITY)
Admission: RE | Admit: 2017-10-10 | Discharge: 2017-10-10 | Disposition: A | Discharge status: Home / Self Care | Payer: Medicaid Other | Source: Ambulatory Visit | Attending: Neurology | Admitting: Neurology

## 2017-10-10 DIAGNOSIS — R569 Unspecified convulsions: Secondary | ICD-10-CM | POA: Diagnosis not present

## 2017-10-10 DIAGNOSIS — G40219 Localization-related (focal) (partial) symptomatic epilepsy and epileptic syndromes with complex partial seizures, intractable, without status epilepticus: Secondary | ICD-10-CM | POA: Diagnosis not present

## 2017-10-10 NOTE — Progress Notes (Signed)
EEG Completed; Results Pending  

## 2017-10-10 NOTE — Procedures (Signed)
ELECTROENCEPHALOGRAM REPORT  Date of Study: 10/10/2017  Patient's Name: Thomas [name] MRN: [id number] Date of Birth: 05-10-1956  Referring Provider: Dr. Cherylann Banas  Clinical History: This is a 62 year old man with seizures, memory and speech problems  Medications: Keppra  Technical Summary: A multichannel digital EEG recording measured by the international 10-20 system with electrodes applied with paste and impedances below 5000 ohms performed in our laboratory with EKG monitoring in an awake and asleep patient.  Hyperventilation was not performed. Photic stimulation was performed.  The digital EEG was referentially recorded, reformatted, and digitally filtered in a variety of bipolar and referential montages for optimal display.    Description: The patient is awake and asleep during the recording.  During maximal wakefulness, there is a symmetric, medium voltage 9 Hz posterior dominant rhythm that attenuates with eye opening.  The record is symmetric.  During drowsiness and sleep, there is an increase in theta slowing of the background.  Vertex waves and symmetric sleep spindles were seen.  Photic stimulation did not elicit any abnormalities.  There were no epileptiform discharges or electrographic seizures seen.    EKG lead was unremarkable.  Impression: This awake and asleep EEG is normal.    Clinical Correlation: A normal EEG does not exclude a clinical diagnosis of epilepsy. Clinical correlation is advised.   Ellouise Newer, M.D.

## 2017-11-21 DIAGNOSIS — Z87891 Personal history of nicotine dependence: Secondary | ICD-10-CM | POA: Diagnosis not present

## 2017-11-21 DIAGNOSIS — E1165 Type 2 diabetes mellitus with hyperglycemia: Secondary | ICD-10-CM | POA: Diagnosis not present

## 2017-11-21 DIAGNOSIS — I10 Essential (primary) hypertension: Secondary | ICD-10-CM | POA: Diagnosis not present

## 2017-11-21 DIAGNOSIS — E1142 Type 2 diabetes mellitus with diabetic polyneuropathy: Secondary | ICD-10-CM | POA: Diagnosis not present

## 2017-11-21 DIAGNOSIS — K219 Gastro-esophageal reflux disease without esophagitis: Secondary | ICD-10-CM | POA: Diagnosis not present

## 2017-11-21 DIAGNOSIS — E559 Vitamin D deficiency, unspecified: Secondary | ICD-10-CM | POA: Diagnosis not present

## 2017-11-21 DIAGNOSIS — E7849 Other hyperlipidemia: Secondary | ICD-10-CM | POA: Diagnosis not present

## 2017-11-21 DIAGNOSIS — R569 Unspecified convulsions: Secondary | ICD-10-CM | POA: Diagnosis not present

## 2018-01-31 DIAGNOSIS — G40219 Localization-related (focal) (partial) symptomatic epilepsy and epileptic syndromes with complex partial seizures, intractable, without status epilepticus: Secondary | ICD-10-CM | POA: Diagnosis not present

## 2018-01-31 DIAGNOSIS — D329 Benign neoplasm of meninges, unspecified: Secondary | ICD-10-CM | POA: Diagnosis not present

## 2018-02-07 DIAGNOSIS — E1165 Type 2 diabetes mellitus with hyperglycemia: Secondary | ICD-10-CM | POA: Diagnosis not present

## 2018-02-07 DIAGNOSIS — I10 Essential (primary) hypertension: Secondary | ICD-10-CM | POA: Diagnosis not present

## 2018-02-07 DIAGNOSIS — E7849 Other hyperlipidemia: Secondary | ICD-10-CM | POA: Diagnosis not present

## 2018-02-13 DIAGNOSIS — E1142 Type 2 diabetes mellitus with diabetic polyneuropathy: Secondary | ICD-10-CM | POA: Diagnosis not present

## 2018-02-13 DIAGNOSIS — E1165 Type 2 diabetes mellitus with hyperglycemia: Secondary | ICD-10-CM | POA: Diagnosis not present

## 2018-02-20 DIAGNOSIS — R0601 Orthopnea: Secondary | ICD-10-CM | POA: Diagnosis not present

## 2018-02-20 DIAGNOSIS — R569 Unspecified convulsions: Secondary | ICD-10-CM | POA: Diagnosis not present

## 2018-02-20 DIAGNOSIS — I10 Essential (primary) hypertension: Secondary | ICD-10-CM | POA: Diagnosis not present

## 2018-02-20 DIAGNOSIS — Z87891 Personal history of nicotine dependence: Secondary | ICD-10-CM | POA: Diagnosis not present

## 2018-02-20 DIAGNOSIS — E7849 Other hyperlipidemia: Secondary | ICD-10-CM | POA: Diagnosis not present

## 2018-02-20 DIAGNOSIS — E1165 Type 2 diabetes mellitus with hyperglycemia: Secondary | ICD-10-CM | POA: Diagnosis not present

## 2018-02-20 DIAGNOSIS — K219 Gastro-esophageal reflux disease without esophagitis: Secondary | ICD-10-CM | POA: Diagnosis not present

## 2018-02-20 DIAGNOSIS — E1142 Type 2 diabetes mellitus with diabetic polyneuropathy: Secondary | ICD-10-CM | POA: Diagnosis not present

## 2018-02-20 DIAGNOSIS — E559 Vitamin D deficiency, unspecified: Secondary | ICD-10-CM | POA: Diagnosis not present

## 2018-03-06 DIAGNOSIS — E559 Vitamin D deficiency, unspecified: Secondary | ICD-10-CM | POA: Diagnosis not present

## 2018-03-06 DIAGNOSIS — E1165 Type 2 diabetes mellitus with hyperglycemia: Secondary | ICD-10-CM | POA: Diagnosis not present

## 2018-03-06 DIAGNOSIS — R569 Unspecified convulsions: Secondary | ICD-10-CM | POA: Diagnosis not present

## 2018-03-06 DIAGNOSIS — I10 Essential (primary) hypertension: Secondary | ICD-10-CM | POA: Diagnosis not present

## 2018-03-06 DIAGNOSIS — K219 Gastro-esophageal reflux disease without esophagitis: Secondary | ICD-10-CM | POA: Diagnosis not present

## 2018-03-06 DIAGNOSIS — Z87891 Personal history of nicotine dependence: Secondary | ICD-10-CM | POA: Diagnosis not present

## 2018-03-06 DIAGNOSIS — E1142 Type 2 diabetes mellitus with diabetic polyneuropathy: Secondary | ICD-10-CM | POA: Diagnosis not present

## 2018-03-06 DIAGNOSIS — E7849 Other hyperlipidemia: Secondary | ICD-10-CM | POA: Diagnosis not present

## 2018-03-06 DIAGNOSIS — Z23 Encounter for immunization: Secondary | ICD-10-CM | POA: Diagnosis not present

## 2018-04-24 DIAGNOSIS — E7800 Pure hypercholesterolemia, unspecified: Secondary | ICD-10-CM | POA: Diagnosis not present

## 2018-04-24 DIAGNOSIS — R569 Unspecified convulsions: Secondary | ICD-10-CM | POA: Diagnosis not present

## 2018-04-24 DIAGNOSIS — E1165 Type 2 diabetes mellitus with hyperglycemia: Secondary | ICD-10-CM | POA: Diagnosis not present

## 2018-04-24 DIAGNOSIS — E1142 Type 2 diabetes mellitus with diabetic polyneuropathy: Secondary | ICD-10-CM | POA: Diagnosis not present

## 2018-04-24 DIAGNOSIS — K219 Gastro-esophageal reflux disease without esophagitis: Secondary | ICD-10-CM | POA: Diagnosis not present

## 2018-04-24 DIAGNOSIS — I10 Essential (primary) hypertension: Secondary | ICD-10-CM | POA: Diagnosis not present

## 2018-04-24 DIAGNOSIS — E559 Vitamin D deficiency, unspecified: Secondary | ICD-10-CM | POA: Diagnosis not present

## 2018-04-24 DIAGNOSIS — Z87891 Personal history of nicotine dependence: Secondary | ICD-10-CM | POA: Diagnosis not present

## 2018-05-15 DIAGNOSIS — G40219 Localization-related (focal) (partial) symptomatic epilepsy and epileptic syndromes with complex partial seizures, intractable, without status epilepticus: Secondary | ICD-10-CM | POA: Diagnosis not present

## 2018-05-15 DIAGNOSIS — D329 Benign neoplasm of meninges, unspecified: Secondary | ICD-10-CM | POA: Diagnosis not present

## 2018-06-28 DIAGNOSIS — E1142 Type 2 diabetes mellitus with diabetic polyneuropathy: Secondary | ICD-10-CM | POA: Diagnosis not present

## 2018-06-28 DIAGNOSIS — I872 Venous insufficiency (chronic) (peripheral): Secondary | ICD-10-CM | POA: Insufficient documentation

## 2018-06-28 DIAGNOSIS — E119 Type 2 diabetes mellitus without complications: Secondary | ICD-10-CM | POA: Diagnosis not present

## 2018-06-29 DIAGNOSIS — H25813 Combined forms of age-related cataract, bilateral: Secondary | ICD-10-CM | POA: Diagnosis not present

## 2018-06-29 DIAGNOSIS — H35033 Hypertensive retinopathy, bilateral: Secondary | ICD-10-CM | POA: Diagnosis not present

## 2018-06-29 DIAGNOSIS — E119 Type 2 diabetes mellitus without complications: Secondary | ICD-10-CM | POA: Diagnosis not present

## 2018-06-29 DIAGNOSIS — H538 Other visual disturbances: Secondary | ICD-10-CM | POA: Diagnosis not present

## 2018-06-29 DIAGNOSIS — H40023 Open angle with borderline findings, high risk, bilateral: Secondary | ICD-10-CM | POA: Diagnosis not present

## 2018-07-14 DIAGNOSIS — H40023 Open angle with borderline findings, high risk, bilateral: Secondary | ICD-10-CM | POA: Diagnosis not present

## 2018-07-14 DIAGNOSIS — H25813 Combined forms of age-related cataract, bilateral: Secondary | ICD-10-CM | POA: Diagnosis not present

## 2018-07-17 DIAGNOSIS — Z01118 Encounter for examination of ears and hearing with other abnormal findings: Secondary | ICD-10-CM | POA: Diagnosis not present

## 2018-07-17 DIAGNOSIS — Z5181 Encounter for therapeutic drug level monitoring: Secondary | ICD-10-CM | POA: Diagnosis not present

## 2018-07-17 DIAGNOSIS — R569 Unspecified convulsions: Secondary | ICD-10-CM | POA: Diagnosis not present

## 2018-07-17 DIAGNOSIS — E559 Vitamin D deficiency, unspecified: Secondary | ICD-10-CM | POA: Diagnosis not present

## 2018-07-17 DIAGNOSIS — K219 Gastro-esophageal reflux disease without esophagitis: Secondary | ICD-10-CM | POA: Diagnosis not present

## 2018-07-17 DIAGNOSIS — E7800 Pure hypercholesterolemia, unspecified: Secondary | ICD-10-CM | POA: Diagnosis not present

## 2018-07-17 DIAGNOSIS — E1142 Type 2 diabetes mellitus with diabetic polyneuropathy: Secondary | ICD-10-CM | POA: Diagnosis not present

## 2018-07-17 DIAGNOSIS — I10 Essential (primary) hypertension: Secondary | ICD-10-CM | POA: Diagnosis not present

## 2018-07-17 DIAGNOSIS — Z125 Encounter for screening for malignant neoplasm of prostate: Secondary | ICD-10-CM | POA: Diagnosis not present

## 2018-07-17 DIAGNOSIS — Z87891 Personal history of nicotine dependence: Secondary | ICD-10-CM | POA: Diagnosis not present

## 2018-07-17 DIAGNOSIS — E1165 Type 2 diabetes mellitus with hyperglycemia: Secondary | ICD-10-CM | POA: Diagnosis not present

## 2018-09-18 DIAGNOSIS — D649 Anemia, unspecified: Secondary | ICD-10-CM | POA: Diagnosis not present

## 2018-09-18 DIAGNOSIS — K219 Gastro-esophageal reflux disease without esophagitis: Secondary | ICD-10-CM | POA: Diagnosis not present

## 2018-09-18 DIAGNOSIS — R569 Unspecified convulsions: Secondary | ICD-10-CM | POA: Diagnosis not present

## 2018-09-18 DIAGNOSIS — I10 Essential (primary) hypertension: Secondary | ICD-10-CM | POA: Diagnosis not present

## 2018-09-18 DIAGNOSIS — E1142 Type 2 diabetes mellitus with diabetic polyneuropathy: Secondary | ICD-10-CM | POA: Diagnosis not present

## 2018-09-18 DIAGNOSIS — Z87891 Personal history of nicotine dependence: Secondary | ICD-10-CM | POA: Diagnosis not present

## 2018-09-18 DIAGNOSIS — E7800 Pure hypercholesterolemia, unspecified: Secondary | ICD-10-CM | POA: Diagnosis not present

## 2018-09-18 DIAGNOSIS — E559 Vitamin D deficiency, unspecified: Secondary | ICD-10-CM | POA: Diagnosis not present

## 2018-09-18 DIAGNOSIS — E1165 Type 2 diabetes mellitus with hyperglycemia: Secondary | ICD-10-CM | POA: Diagnosis not present

## 2018-09-19 DIAGNOSIS — D329 Benign neoplasm of meninges, unspecified: Secondary | ICD-10-CM | POA: Diagnosis not present

## 2018-09-19 DIAGNOSIS — R413 Other amnesia: Secondary | ICD-10-CM | POA: Diagnosis not present

## 2018-09-19 DIAGNOSIS — F419 Anxiety disorder, unspecified: Secondary | ICD-10-CM | POA: Diagnosis not present

## 2018-09-19 DIAGNOSIS — G40219 Localization-related (focal) (partial) symptomatic epilepsy and epileptic syndromes with complex partial seizures, intractable, without status epilepticus: Secondary | ICD-10-CM | POA: Diagnosis not present

## 2018-10-30 DIAGNOSIS — I10 Essential (primary) hypertension: Secondary | ICD-10-CM | POA: Diagnosis not present

## 2018-10-30 DIAGNOSIS — K219 Gastro-esophageal reflux disease without esophagitis: Secondary | ICD-10-CM | POA: Diagnosis not present

## 2018-10-30 DIAGNOSIS — E1142 Type 2 diabetes mellitus with diabetic polyneuropathy: Secondary | ICD-10-CM | POA: Diagnosis not present

## 2018-10-30 DIAGNOSIS — E559 Vitamin D deficiency, unspecified: Secondary | ICD-10-CM | POA: Diagnosis not present

## 2018-10-30 DIAGNOSIS — Z0001 Encounter for general adult medical examination with abnormal findings: Secondary | ICD-10-CM | POA: Diagnosis not present

## 2018-10-30 DIAGNOSIS — Z87891 Personal history of nicotine dependence: Secondary | ICD-10-CM | POA: Diagnosis not present

## 2018-10-30 DIAGNOSIS — D649 Anemia, unspecified: Secondary | ICD-10-CM | POA: Diagnosis not present

## 2018-10-30 DIAGNOSIS — E1165 Type 2 diabetes mellitus with hyperglycemia: Secondary | ICD-10-CM | POA: Diagnosis not present

## 2018-10-30 DIAGNOSIS — R569 Unspecified convulsions: Secondary | ICD-10-CM | POA: Diagnosis not present

## 2018-10-30 DIAGNOSIS — E7800 Pure hypercholesterolemia, unspecified: Secondary | ICD-10-CM | POA: Diagnosis not present

## 2018-12-11 DIAGNOSIS — E7800 Pure hypercholesterolemia, unspecified: Secondary | ICD-10-CM | POA: Diagnosis not present

## 2018-12-11 DIAGNOSIS — E1142 Type 2 diabetes mellitus with diabetic polyneuropathy: Secondary | ICD-10-CM | POA: Diagnosis not present

## 2018-12-11 DIAGNOSIS — Z87891 Personal history of nicotine dependence: Secondary | ICD-10-CM | POA: Diagnosis not present

## 2018-12-11 DIAGNOSIS — E1165 Type 2 diabetes mellitus with hyperglycemia: Secondary | ICD-10-CM | POA: Diagnosis not present

## 2018-12-11 DIAGNOSIS — I10 Essential (primary) hypertension: Secondary | ICD-10-CM | POA: Diagnosis not present

## 2018-12-11 DIAGNOSIS — E559 Vitamin D deficiency, unspecified: Secondary | ICD-10-CM | POA: Diagnosis not present

## 2018-12-11 DIAGNOSIS — K219 Gastro-esophageal reflux disease without esophagitis: Secondary | ICD-10-CM | POA: Diagnosis not present

## 2018-12-11 DIAGNOSIS — R569 Unspecified convulsions: Secondary | ICD-10-CM | POA: Diagnosis not present

## 2018-12-13 DIAGNOSIS — D329 Benign neoplasm of meninges, unspecified: Secondary | ICD-10-CM | POA: Diagnosis not present

## 2018-12-13 DIAGNOSIS — G40219 Localization-related (focal) (partial) symptomatic epilepsy and epileptic syndromes with complex partial seizures, intractable, without status epilepticus: Secondary | ICD-10-CM | POA: Diagnosis not present

## 2018-12-13 DIAGNOSIS — R413 Other amnesia: Secondary | ICD-10-CM | POA: Diagnosis not present

## 2018-12-13 DIAGNOSIS — F419 Anxiety disorder, unspecified: Secondary | ICD-10-CM | POA: Diagnosis not present

## 2018-12-19 DIAGNOSIS — Z9889 Other specified postprocedural states: Secondary | ICD-10-CM | POA: Diagnosis not present

## 2018-12-19 DIAGNOSIS — R51 Headache: Secondary | ICD-10-CM | POA: Diagnosis not present

## 2019-01-01 DIAGNOSIS — E1165 Type 2 diabetes mellitus with hyperglycemia: Secondary | ICD-10-CM | POA: Diagnosis not present

## 2019-01-01 DIAGNOSIS — K219 Gastro-esophageal reflux disease without esophagitis: Secondary | ICD-10-CM | POA: Diagnosis not present

## 2019-01-01 DIAGNOSIS — I10 Essential (primary) hypertension: Secondary | ICD-10-CM | POA: Diagnosis not present

## 2019-01-01 DIAGNOSIS — R569 Unspecified convulsions: Secondary | ICD-10-CM | POA: Diagnosis not present

## 2019-01-01 DIAGNOSIS — E559 Vitamin D deficiency, unspecified: Secondary | ICD-10-CM | POA: Diagnosis not present

## 2019-01-01 DIAGNOSIS — Z87891 Personal history of nicotine dependence: Secondary | ICD-10-CM | POA: Diagnosis not present

## 2019-01-01 DIAGNOSIS — E7800 Pure hypercholesterolemia, unspecified: Secondary | ICD-10-CM | POA: Diagnosis not present

## 2019-01-01 DIAGNOSIS — E1142 Type 2 diabetes mellitus with diabetic polyneuropathy: Secondary | ICD-10-CM | POA: Diagnosis not present

## 2019-01-22 DIAGNOSIS — E1142 Type 2 diabetes mellitus with diabetic polyneuropathy: Secondary | ICD-10-CM | POA: Diagnosis not present

## 2019-01-22 DIAGNOSIS — E559 Vitamin D deficiency, unspecified: Secondary | ICD-10-CM | POA: Diagnosis not present

## 2019-01-22 DIAGNOSIS — E1165 Type 2 diabetes mellitus with hyperglycemia: Secondary | ICD-10-CM | POA: Diagnosis not present

## 2019-01-22 DIAGNOSIS — I10 Essential (primary) hypertension: Secondary | ICD-10-CM | POA: Diagnosis not present

## 2019-01-22 DIAGNOSIS — R569 Unspecified convulsions: Secondary | ICD-10-CM | POA: Diagnosis not present

## 2019-01-22 DIAGNOSIS — Z87891 Personal history of nicotine dependence: Secondary | ICD-10-CM | POA: Diagnosis not present

## 2019-01-22 DIAGNOSIS — E7800 Pure hypercholesterolemia, unspecified: Secondary | ICD-10-CM | POA: Diagnosis not present

## 2019-01-22 DIAGNOSIS — K219 Gastro-esophageal reflux disease without esophagitis: Secondary | ICD-10-CM | POA: Diagnosis not present

## 2019-02-06 DIAGNOSIS — F419 Anxiety disorder, unspecified: Secondary | ICD-10-CM | POA: Diagnosis not present

## 2019-02-06 DIAGNOSIS — G40219 Localization-related (focal) (partial) symptomatic epilepsy and epileptic syndromes with complex partial seizures, intractable, without status epilepticus: Secondary | ICD-10-CM | POA: Diagnosis not present

## 2019-02-06 DIAGNOSIS — R413 Other amnesia: Secondary | ICD-10-CM | POA: Diagnosis not present

## 2019-02-06 DIAGNOSIS — D329 Benign neoplasm of meninges, unspecified: Secondary | ICD-10-CM | POA: Diagnosis not present

## 2019-02-28 DIAGNOSIS — Z87891 Personal history of nicotine dependence: Secondary | ICD-10-CM | POA: Diagnosis not present

## 2019-02-28 DIAGNOSIS — E1165 Type 2 diabetes mellitus with hyperglycemia: Secondary | ICD-10-CM | POA: Diagnosis not present

## 2019-02-28 DIAGNOSIS — M545 Low back pain: Secondary | ICD-10-CM | POA: Diagnosis not present

## 2019-02-28 DIAGNOSIS — E1142 Type 2 diabetes mellitus with diabetic polyneuropathy: Secondary | ICD-10-CM | POA: Diagnosis not present

## 2019-02-28 DIAGNOSIS — E559 Vitamin D deficiency, unspecified: Secondary | ICD-10-CM | POA: Diagnosis not present

## 2019-02-28 DIAGNOSIS — E7800 Pure hypercholesterolemia, unspecified: Secondary | ICD-10-CM | POA: Diagnosis not present

## 2019-02-28 DIAGNOSIS — R569 Unspecified convulsions: Secondary | ICD-10-CM | POA: Diagnosis not present

## 2019-02-28 DIAGNOSIS — K219 Gastro-esophageal reflux disease without esophagitis: Secondary | ICD-10-CM | POA: Diagnosis not present

## 2019-02-28 DIAGNOSIS — I10 Essential (primary) hypertension: Secondary | ICD-10-CM | POA: Diagnosis not present

## 2019-03-08 DIAGNOSIS — N289 Disorder of kidney and ureter, unspecified: Secondary | ICD-10-CM | POA: Diagnosis not present

## 2019-03-08 DIAGNOSIS — I10 Essential (primary) hypertension: Secondary | ICD-10-CM | POA: Diagnosis not present

## 2019-05-21 DIAGNOSIS — I10 Essential (primary) hypertension: Secondary | ICD-10-CM | POA: Diagnosis not present

## 2019-05-21 DIAGNOSIS — E559 Vitamin D deficiency, unspecified: Secondary | ICD-10-CM | POA: Diagnosis not present

## 2019-05-21 DIAGNOSIS — E1165 Type 2 diabetes mellitus with hyperglycemia: Secondary | ICD-10-CM | POA: Diagnosis not present

## 2019-05-21 DIAGNOSIS — E7800 Pure hypercholesterolemia, unspecified: Secondary | ICD-10-CM | POA: Diagnosis not present

## 2019-05-21 DIAGNOSIS — K219 Gastro-esophageal reflux disease without esophagitis: Secondary | ICD-10-CM | POA: Diagnosis not present

## 2019-05-21 DIAGNOSIS — N182 Chronic kidney disease, stage 2 (mild): Secondary | ICD-10-CM | POA: Diagnosis not present

## 2019-05-21 DIAGNOSIS — Z87891 Personal history of nicotine dependence: Secondary | ICD-10-CM | POA: Diagnosis not present

## 2019-05-21 DIAGNOSIS — E1142 Type 2 diabetes mellitus with diabetic polyneuropathy: Secondary | ICD-10-CM | POA: Diagnosis not present

## 2019-05-21 DIAGNOSIS — R569 Unspecified convulsions: Secondary | ICD-10-CM | POA: Diagnosis not present

## 2019-05-21 DIAGNOSIS — Z23 Encounter for immunization: Secondary | ICD-10-CM | POA: Diagnosis not present

## 2019-06-26 DIAGNOSIS — F419 Anxiety disorder, unspecified: Secondary | ICD-10-CM | POA: Diagnosis not present

## 2019-06-26 DIAGNOSIS — G40219 Localization-related (focal) (partial) symptomatic epilepsy and epileptic syndromes with complex partial seizures, intractable, without status epilepticus: Secondary | ICD-10-CM | POA: Diagnosis not present

## 2019-06-26 DIAGNOSIS — D329 Benign neoplasm of meninges, unspecified: Secondary | ICD-10-CM | POA: Diagnosis not present

## 2019-06-26 DIAGNOSIS — R519 Headache, unspecified: Secondary | ICD-10-CM | POA: Diagnosis not present

## 2019-07-02 DIAGNOSIS — G9389 Other specified disorders of brain: Secondary | ICD-10-CM | POA: Diagnosis not present

## 2019-07-08 ENCOUNTER — Ambulatory Visit: Payer: Self-pay | Attending: Internal Medicine

## 2019-07-08 DIAGNOSIS — Z23 Encounter for immunization: Secondary | ICD-10-CM | POA: Insufficient documentation

## 2019-07-08 NOTE — Progress Notes (Signed)
   Covid-19 Vaccination Clinic  Name:  Thomas [name]    MRN: [id number] DOB: 28-Dec-1955  07/08/2019  [name] was observed post Covid-19 immunization for 15 minutes without incidence. He was provided with Vaccine Information Sheet and instruction to access the V-Safe system.   [name] was instructed to call [phone] with any severe reactions post vaccine: Marland Kitchen Difficulty breathing  . Swelling of your face and throat  . A fast heartbeat  . A bad rash all over your body  . Dizziness and weakness    Immunizations Administered    Name Date Dose VIS Date Route   Moderna COVID-19 Vaccine 07/08/2019  3:27 PM 0.5 mL 04/10/2019 Intramuscular   Manufacturer: Moderna   Lot: [id number]   [place]: [id number]

## 2019-07-11 DIAGNOSIS — E1122 Type 2 diabetes mellitus with diabetic chronic kidney disease: Secondary | ICD-10-CM | POA: Diagnosis not present

## 2019-07-11 DIAGNOSIS — R809 Proteinuria, unspecified: Secondary | ICD-10-CM | POA: Diagnosis not present

## 2019-07-11 DIAGNOSIS — I129 Hypertensive chronic kidney disease with stage 1 through stage 4 chronic kidney disease, or unspecified chronic kidney disease: Secondary | ICD-10-CM | POA: Diagnosis not present

## 2019-07-11 DIAGNOSIS — E1129 Type 2 diabetes mellitus with other diabetic kidney complication: Secondary | ICD-10-CM | POA: Diagnosis not present

## 2019-07-11 DIAGNOSIS — N1831 Chronic kidney disease, stage 3a: Secondary | ICD-10-CM | POA: Diagnosis not present

## 2019-07-12 ENCOUNTER — Other Ambulatory Visit: Payer: Self-pay | Admitting: Nephrology

## 2019-07-12 DIAGNOSIS — N1831 Chronic kidney disease, stage 3a: Secondary | ICD-10-CM

## 2019-07-17 ENCOUNTER — Other Ambulatory Visit: Payer: Self-pay

## 2019-08-11 ENCOUNTER — Ambulatory Visit: Payer: Self-pay | Attending: Internal Medicine

## 2019-08-11 DIAGNOSIS — Z23 Encounter for immunization: Secondary | ICD-10-CM

## 2019-08-11 NOTE — Progress Notes (Signed)
   Covid-19 Vaccination Clinic  Name:  TREVINO WYATT    MRN: [id number] DOB: Jul 01, 1955  08/11/2019  Mr. Bornemann was observed post Covid-19 immunization for 15 minutes without incident. He was provided with Vaccine Information Sheet and instruction to access the V-Safe system.   Mr. Shi was instructed to call 911 with any severe reactions post vaccine: Marland Kitchen Difficulty breathing  . Swelling of face and throat  . A fast heartbeat  . A bad rash all over body  . Dizziness and weakness   Immunizations Administered    Name Date Dose VIS Date Route   Moderna COVID-19 Vaccine 08/11/2019  3:20 PM 0.5 mL 04/10/2019 Intramuscular   Manufacturer: Moderna   Lot: [id number]   [place]: [id number]

## 2019-08-20 DIAGNOSIS — R569 Unspecified convulsions: Secondary | ICD-10-CM | POA: Diagnosis not present

## 2019-08-20 DIAGNOSIS — E1165 Type 2 diabetes mellitus with hyperglycemia: Secondary | ICD-10-CM | POA: Diagnosis not present

## 2019-08-20 DIAGNOSIS — E559 Vitamin D deficiency, unspecified: Secondary | ICD-10-CM | POA: Diagnosis not present

## 2019-08-20 DIAGNOSIS — I10 Essential (primary) hypertension: Secondary | ICD-10-CM | POA: Diagnosis not present

## 2019-08-20 DIAGNOSIS — N182 Chronic kidney disease, stage 2 (mild): Secondary | ICD-10-CM | POA: Diagnosis not present

## 2019-08-20 DIAGNOSIS — E1142 Type 2 diabetes mellitus with diabetic polyneuropathy: Secondary | ICD-10-CM | POA: Diagnosis not present

## 2019-08-20 DIAGNOSIS — Z87891 Personal history of nicotine dependence: Secondary | ICD-10-CM | POA: Diagnosis not present

## 2019-08-20 DIAGNOSIS — E7800 Pure hypercholesterolemia, unspecified: Secondary | ICD-10-CM | POA: Diagnosis not present

## 2019-08-20 DIAGNOSIS — Z125 Encounter for screening for malignant neoplasm of prostate: Secondary | ICD-10-CM | POA: Diagnosis not present

## 2019-08-20 DIAGNOSIS — K219 Gastro-esophageal reflux disease without esophagitis: Secondary | ICD-10-CM | POA: Diagnosis not present

## 2019-09-03 DIAGNOSIS — E1165 Type 2 diabetes mellitus with hyperglycemia: Secondary | ICD-10-CM | POA: Diagnosis not present

## 2019-09-03 DIAGNOSIS — I10 Essential (primary) hypertension: Secondary | ICD-10-CM | POA: Diagnosis not present

## 2019-09-03 DIAGNOSIS — Z87891 Personal history of nicotine dependence: Secondary | ICD-10-CM | POA: Diagnosis not present

## 2019-09-03 DIAGNOSIS — K219 Gastro-esophageal reflux disease without esophagitis: Secondary | ICD-10-CM | POA: Diagnosis not present

## 2019-09-03 DIAGNOSIS — R569 Unspecified convulsions: Secondary | ICD-10-CM | POA: Diagnosis not present

## 2019-09-03 DIAGNOSIS — E7800 Pure hypercholesterolemia, unspecified: Secondary | ICD-10-CM | POA: Diagnosis not present

## 2019-09-03 DIAGNOSIS — E559 Vitamin D deficiency, unspecified: Secondary | ICD-10-CM | POA: Diagnosis not present

## 2019-09-03 DIAGNOSIS — N182 Chronic kidney disease, stage 2 (mild): Secondary | ICD-10-CM | POA: Diagnosis not present

## 2019-09-03 DIAGNOSIS — E1142 Type 2 diabetes mellitus with diabetic polyneuropathy: Secondary | ICD-10-CM | POA: Diagnosis not present

## 2019-11-19 DIAGNOSIS — R569 Unspecified convulsions: Secondary | ICD-10-CM | POA: Diagnosis not present

## 2019-11-19 DIAGNOSIS — E1165 Type 2 diabetes mellitus with hyperglycemia: Secondary | ICD-10-CM | POA: Diagnosis not present

## 2019-11-19 DIAGNOSIS — N182 Chronic kidney disease, stage 2 (mild): Secondary | ICD-10-CM | POA: Diagnosis not present

## 2019-11-19 DIAGNOSIS — E7800 Pure hypercholesterolemia, unspecified: Secondary | ICD-10-CM | POA: Diagnosis not present

## 2019-11-19 DIAGNOSIS — Z87891 Personal history of nicotine dependence: Secondary | ICD-10-CM | POA: Diagnosis not present

## 2019-11-19 DIAGNOSIS — K219 Gastro-esophageal reflux disease without esophagitis: Secondary | ICD-10-CM | POA: Diagnosis not present

## 2019-11-19 DIAGNOSIS — I10 Essential (primary) hypertension: Secondary | ICD-10-CM | POA: Diagnosis not present

## 2019-11-19 DIAGNOSIS — E1142 Type 2 diabetes mellitus with diabetic polyneuropathy: Secondary | ICD-10-CM | POA: Diagnosis not present

## 2019-11-19 DIAGNOSIS — E559 Vitamin D deficiency, unspecified: Secondary | ICD-10-CM | POA: Diagnosis not present

## 2020-01-28 DIAGNOSIS — N182 Chronic kidney disease, stage 2 (mild): Secondary | ICD-10-CM | POA: Diagnosis not present

## 2020-01-28 DIAGNOSIS — I10 Essential (primary) hypertension: Secondary | ICD-10-CM | POA: Diagnosis not present

## 2020-01-28 DIAGNOSIS — E1165 Type 2 diabetes mellitus with hyperglycemia: Secondary | ICD-10-CM | POA: Diagnosis not present

## 2020-01-28 DIAGNOSIS — R569 Unspecified convulsions: Secondary | ICD-10-CM | POA: Diagnosis not present

## 2020-01-28 DIAGNOSIS — E7800 Pure hypercholesterolemia, unspecified: Secondary | ICD-10-CM | POA: Diagnosis not present

## 2020-01-28 DIAGNOSIS — Z87891 Personal history of nicotine dependence: Secondary | ICD-10-CM | POA: Diagnosis not present

## 2020-01-28 DIAGNOSIS — E1142 Type 2 diabetes mellitus with diabetic polyneuropathy: Secondary | ICD-10-CM | POA: Diagnosis not present

## 2020-01-28 DIAGNOSIS — K219 Gastro-esophageal reflux disease without esophagitis: Secondary | ICD-10-CM | POA: Diagnosis not present

## 2020-01-28 DIAGNOSIS — E559 Vitamin D deficiency, unspecified: Secondary | ICD-10-CM | POA: Diagnosis not present

## 2020-02-18 DIAGNOSIS — E7800 Pure hypercholesterolemia, unspecified: Secondary | ICD-10-CM | POA: Diagnosis not present

## 2020-02-18 DIAGNOSIS — E1142 Type 2 diabetes mellitus with diabetic polyneuropathy: Secondary | ICD-10-CM | POA: Diagnosis not present

## 2020-02-18 DIAGNOSIS — Z87891 Personal history of nicotine dependence: Secondary | ICD-10-CM | POA: Diagnosis not present

## 2020-02-18 DIAGNOSIS — E1165 Type 2 diabetes mellitus with hyperglycemia: Secondary | ICD-10-CM | POA: Diagnosis not present

## 2020-02-18 DIAGNOSIS — E559 Vitamin D deficiency, unspecified: Secondary | ICD-10-CM | POA: Diagnosis not present

## 2020-02-18 DIAGNOSIS — K219 Gastro-esophageal reflux disease without esophagitis: Secondary | ICD-10-CM | POA: Diagnosis not present

## 2020-02-18 DIAGNOSIS — R569 Unspecified convulsions: Secondary | ICD-10-CM | POA: Diagnosis not present

## 2020-02-18 DIAGNOSIS — N182 Chronic kidney disease, stage 2 (mild): Secondary | ICD-10-CM | POA: Diagnosis not present

## 2020-02-18 DIAGNOSIS — I10 Essential (primary) hypertension: Secondary | ICD-10-CM | POA: Diagnosis not present

## 2020-03-28 DIAGNOSIS — Z6841 Body Mass Index (BMI) 40.0 and over, adult: Secondary | ICD-10-CM | POA: Diagnosis not present

## 2020-03-28 DIAGNOSIS — F419 Anxiety disorder, unspecified: Secondary | ICD-10-CM | POA: Diagnosis not present

## 2020-03-28 DIAGNOSIS — Z79899 Other long term (current) drug therapy: Secondary | ICD-10-CM | POA: Diagnosis not present

## 2020-03-28 DIAGNOSIS — D496 Neoplasm of unspecified behavior of brain: Secondary | ICD-10-CM | POA: Diagnosis not present

## 2020-03-28 DIAGNOSIS — Z008 Encounter for other general examination: Secondary | ICD-10-CM | POA: Diagnosis not present

## 2020-03-28 DIAGNOSIS — F32A Depression, unspecified: Secondary | ICD-10-CM | POA: Diagnosis not present

## 2020-03-28 DIAGNOSIS — E119 Type 2 diabetes mellitus without complications: Secondary | ICD-10-CM | POA: Diagnosis not present

## 2020-03-28 DIAGNOSIS — Z0000 Encounter for general adult medical examination without abnormal findings: Secondary | ICD-10-CM | POA: Diagnosis not present

## 2020-03-28 DIAGNOSIS — Z9889 Other specified postprocedural states: Secondary | ICD-10-CM | POA: Diagnosis not present

## 2020-03-28 DIAGNOSIS — G40909 Epilepsy, unspecified, not intractable, without status epilepticus: Secondary | ICD-10-CM | POA: Diagnosis not present

## 2020-04-21 DIAGNOSIS — Z1159 Encounter for screening for other viral diseases: Secondary | ICD-10-CM | POA: Diagnosis not present

## 2020-04-21 DIAGNOSIS — F320 Major depressive disorder, single episode, mild: Secondary | ICD-10-CM | POA: Diagnosis not present

## 2020-04-21 DIAGNOSIS — L989 Disorder of the skin and subcutaneous tissue, unspecified: Secondary | ICD-10-CM | POA: Diagnosis not present

## 2020-04-21 DIAGNOSIS — G40909 Epilepsy, unspecified, not intractable, without status epilepticus: Secondary | ICD-10-CM | POA: Diagnosis not present

## 2020-04-21 DIAGNOSIS — F329 Major depressive disorder, single episode, unspecified: Secondary | ICD-10-CM | POA: Diagnosis not present

## 2020-04-21 DIAGNOSIS — I70203 Unspecified atherosclerosis of native arteries of extremities, bilateral legs: Secondary | ICD-10-CM | POA: Diagnosis not present

## 2020-04-21 DIAGNOSIS — K219 Gastro-esophageal reflux disease without esophagitis: Secondary | ICD-10-CM | POA: Diagnosis not present

## 2020-04-21 DIAGNOSIS — Z79899 Other long term (current) drug therapy: Secondary | ICD-10-CM | POA: Diagnosis not present

## 2020-04-21 DIAGNOSIS — E785 Hyperlipidemia, unspecified: Secondary | ICD-10-CM | POA: Diagnosis not present

## 2020-04-21 DIAGNOSIS — E1140 Type 2 diabetes mellitus with diabetic neuropathy, unspecified: Secondary | ICD-10-CM | POA: Diagnosis not present

## 2020-04-21 DIAGNOSIS — F419 Anxiety disorder, unspecified: Secondary | ICD-10-CM | POA: Diagnosis not present

## 2020-04-30 DIAGNOSIS — Z79899 Other long term (current) drug therapy: Secondary | ICD-10-CM | POA: Diagnosis not present

## 2020-07-07 ENCOUNTER — Other Ambulatory Visit: Payer: Self-pay | Admitting: Student

## 2020-07-07 DIAGNOSIS — R109 Unspecified abdominal pain: Secondary | ICD-10-CM

## 2020-09-04 ENCOUNTER — Encounter (HOSPITAL_COMMUNITY): Payer: Self-pay | Admitting: Emergency Medicine

## 2020-09-04 ENCOUNTER — Emergency Department (HOSPITAL_COMMUNITY): Payer: Medicare HMO

## 2020-09-04 ENCOUNTER — Inpatient Hospital Stay (HOSPITAL_COMMUNITY)
Admission: EM | Admit: 2020-09-04 | Discharge: 2020-09-17 | DRG: 330 | Disposition: A | Discharge status: Home Health | Payer: Medicare HMO | Attending: Family Medicine | Admitting: Family Medicine

## 2020-09-04 ENCOUNTER — Other Ambulatory Visit: Payer: Self-pay

## 2020-09-04 DIAGNOSIS — K5900 Constipation, unspecified: Secondary | ICD-10-CM | POA: Diagnosis present

## 2020-09-04 DIAGNOSIS — C772 Secondary and unspecified malignant neoplasm of intra-abdominal lymph nodes: Secondary | ICD-10-CM | POA: Diagnosis present

## 2020-09-04 DIAGNOSIS — K420 Umbilical hernia with obstruction, without gangrene: Secondary | ICD-10-CM | POA: Diagnosis present

## 2020-09-04 DIAGNOSIS — Z933 Colostomy status: Secondary | ICD-10-CM

## 2020-09-04 DIAGNOSIS — Z833 Family history of diabetes mellitus: Secondary | ICD-10-CM

## 2020-09-04 DIAGNOSIS — Z20822 Contact with and (suspected) exposure to covid-19: Secondary | ICD-10-CM | POA: Diagnosis present

## 2020-09-04 DIAGNOSIS — K5730 Diverticulosis of large intestine without perforation or abscess without bleeding: Secondary | ICD-10-CM | POA: Diagnosis present

## 2020-09-04 DIAGNOSIS — Z800 Family history of malignant neoplasm of digestive organs: Secondary | ICD-10-CM

## 2020-09-04 DIAGNOSIS — Z86011 Personal history of benign neoplasm of the brain: Secondary | ICD-10-CM

## 2020-09-04 DIAGNOSIS — C184 Malignant neoplasm of transverse colon: Principal | ICD-10-CM | POA: Diagnosis present

## 2020-09-04 DIAGNOSIS — Z79899 Other long term (current) drug therapy: Secondary | ICD-10-CM

## 2020-09-04 DIAGNOSIS — R109 Unspecified abdominal pain: Secondary | ICD-10-CM | POA: Diagnosis present

## 2020-09-04 DIAGNOSIS — R935 Abnormal findings on diagnostic imaging of other abdominal regions, including retroperitoneum: Secondary | ICD-10-CM

## 2020-09-04 DIAGNOSIS — Z6841 Body Mass Index (BMI) 40.0 and over, adult: Secondary | ICD-10-CM

## 2020-09-04 DIAGNOSIS — N179 Acute kidney failure, unspecified: Secondary | ICD-10-CM | POA: Diagnosis not present

## 2020-09-04 DIAGNOSIS — Z7984 Long term (current) use of oral hypoglycemic drugs: Secondary | ICD-10-CM

## 2020-09-04 DIAGNOSIS — E860 Dehydration: Secondary | ICD-10-CM | POA: Diagnosis present

## 2020-09-04 DIAGNOSIS — Z8673 Personal history of transient ischemic attack (TIA), and cerebral infarction without residual deficits: Secondary | ICD-10-CM

## 2020-09-04 DIAGNOSIS — E785 Hyperlipidemia, unspecified: Secondary | ICD-10-CM | POA: Diagnosis present

## 2020-09-04 DIAGNOSIS — E1142 Type 2 diabetes mellitus with diabetic polyneuropathy: Secondary | ICD-10-CM | POA: Diagnosis present

## 2020-09-04 DIAGNOSIS — K5901 Slow transit constipation: Secondary | ICD-10-CM

## 2020-09-04 DIAGNOSIS — R1084 Generalized abdominal pain: Secondary | ICD-10-CM

## 2020-09-04 DIAGNOSIS — K6389 Other specified diseases of intestine: Secondary | ICD-10-CM

## 2020-09-04 DIAGNOSIS — I10 Essential (primary) hypertension: Secondary | ICD-10-CM | POA: Diagnosis present

## 2020-09-04 DIAGNOSIS — E1121 Type 2 diabetes mellitus with diabetic nephropathy: Secondary | ICD-10-CM

## 2020-09-04 DIAGNOSIS — E119 Type 2 diabetes mellitus without complications: Secondary | ICD-10-CM

## 2020-09-04 DIAGNOSIS — Z87891 Personal history of nicotine dependence: Secondary | ICD-10-CM

## 2020-09-04 DIAGNOSIS — E1100 Type 2 diabetes mellitus with hyperosmolarity without nonketotic hyperglycemic-hyperosmolar coma (NKHHC): Secondary | ICD-10-CM

## 2020-09-04 DIAGNOSIS — K621 Rectal polyp: Secondary | ICD-10-CM | POA: Diagnosis present

## 2020-09-04 DIAGNOSIS — K219 Gastro-esophageal reflux disease without esophagitis: Secondary | ICD-10-CM | POA: Diagnosis present

## 2020-09-04 HISTORY — DX: Pneumonitis due to inhalation of food and vomit: J69.0

## 2020-09-04 LAB — CBC WITH DIFFERENTIAL/PLATELET
Abs Immature Granulocytes: 0.06 10*3/uL (ref 0.00–0.07)
Basophils Absolute: 0.1 10*3/uL (ref 0.0–0.1)
Basophils Relative: 1 %
Eosinophils Absolute: 0.3 10*3/uL (ref 0.0–0.5)
Eosinophils Relative: 3 %
HCT: 37.3 % — ABNORMAL LOW (ref 39.0–52.0)
Hemoglobin: 12.6 g/dL — ABNORMAL LOW (ref 13.0–17.0)
Immature Granulocytes: 1 %
Lymphocytes Relative: 20 %
Lymphs Abs: 2.6 10*3/uL (ref 0.7–4.0)
MCH: 25.3 pg — ABNORMAL LOW (ref 26.0–34.0)
MCHC: 33.8 g/dL (ref 30.0–36.0)
MCV: 74.9 fL — ABNORMAL LOW (ref 80.0–100.0)
Monocytes Absolute: 1 10*3/uL (ref 0.1–1.0)
Monocytes Relative: 8 %
Neutro Abs: 9 10*3/uL — ABNORMAL HIGH (ref 1.7–7.7)
Neutrophils Relative %: 67 %
Platelets: 381 10*3/uL (ref 150–400)
RBC: 4.98 MIL/uL (ref 4.22–5.81)
RDW: 14.9 % (ref 11.5–15.5)
WBC: 13.2 10*3/uL — ABNORMAL HIGH (ref 4.0–10.5)
nRBC: 0 % (ref 0.0–0.2)

## 2020-09-04 LAB — CBC
HCT: 37.2 % — ABNORMAL LOW (ref 39.0–52.0)
Hemoglobin: 12.7 g/dL — ABNORMAL LOW (ref 13.0–17.0)
MCH: 25.6 pg — ABNORMAL LOW (ref 26.0–34.0)
MCHC: 34.1 g/dL (ref 30.0–36.0)
MCV: 74.8 fL — ABNORMAL LOW (ref 80.0–100.0)
Platelets: 368 10*3/uL (ref 150–400)
RBC: 4.97 MIL/uL (ref 4.22–5.81)
RDW: 14.8 % (ref 11.5–15.5)
WBC: 13.3 10*3/uL — ABNORMAL HIGH (ref 4.0–10.5)
nRBC: 0 % (ref 0.0–0.2)

## 2020-09-04 LAB — GLUCOSE, CAPILLARY: Glucose-Capillary: 201 mg/dL — ABNORMAL HIGH (ref 70–99)

## 2020-09-04 LAB — BASIC METABOLIC PANEL
Anion gap: 9 (ref 5–15)
BUN: 16 mg/dL (ref 8–23)
CO2: 29 mmol/L (ref 22–32)
Calcium: 9.4 mg/dL (ref 8.9–10.3)
Chloride: 97 mmol/L — ABNORMAL LOW (ref 98–111)
Creatinine, Ser: 1.34 mg/dL — ABNORMAL HIGH (ref 0.61–1.24)
GFR, Estimated: 60 mL/min — ABNORMAL LOW (ref >60)
Glucose, Bld: 227 mg/dL — ABNORMAL HIGH (ref 70–99)
Potassium: 4.7 mmol/L (ref 3.5–5.1)
Sodium: 135 mmol/L (ref 135–145)

## 2020-09-04 LAB — HEPATIC FUNCTION PANEL
ALT: 19 U/L (ref 0–44)
AST: 13 U/L — ABNORMAL LOW (ref 15–41)
Albumin: 3 g/dL — ABNORMAL LOW (ref 3.5–5.0)
Alkaline Phosphatase: 73 U/L (ref 38–126)
Bilirubin, Direct: <0.1 mg/dL (ref 0.0–0.2)
Total Bilirubin: 0.5 mg/dL (ref 0.3–1.2)
Total Protein: 6.6 g/dL (ref 6.5–8.1)

## 2020-09-04 LAB — TROPONIN I (HIGH SENSITIVITY)
Troponin I (High Sensitivity): 9 ng/L (ref <18)
Troponin I (High Sensitivity): 7 ng/L (ref <18)

## 2020-09-04 LAB — RESP PANEL BY RT-PCR (FLU A&B, COVID) ARPGX2
Influenza A by PCR: NEGATIVE
Influenza B by PCR: NEGATIVE
SARS Coronavirus 2 by RT PCR: NEGATIVE

## 2020-09-04 LAB — HIV ANTIBODY (ROUTINE TESTING W REFLEX): HIV Screen 4th Generation wRfx: NONREACTIVE

## 2020-09-04 MED ORDER — LACOSAMIDE 100 MG PO TABS: 100.0000 mg | ORAL_TABLET | Freq: Two times a day (BID) | ORAL | Status: DC

## 2020-09-04 MED ORDER — INSULIN ASPART 100 UNIT/ML IJ SOLN
0.0000 [IU] | Freq: Every day | INTRAMUSCULAR | Status: DC
  Administered 2020-09-04 – 2020-09-05 (×2): 2 [IU] via SUBCUTANEOUS
  Administered 2020-09-10: 3 [IU] via SUBCUTANEOUS

## 2020-09-04 MED ORDER — PEG-KCL-NACL-NASULF-NA ASC-C 100 G PO SOLR
0.5000 | Freq: Once | ORAL | Status: AC
  Administered 2020-09-04: 100 g via ORAL
  Filled 2020-09-04: qty 1

## 2020-09-04 MED ORDER — HEPARIN SODIUM (PORCINE) 5000 UNIT/ML IJ SOLN: 5000.0000 [IU] | Freq: Three times a day (TID) | INTRAMUSCULAR | Status: DC

## 2020-09-04 MED ORDER — POLYETHYLENE GLYCOL 3350 17 G PO PACK
17.0000 g | PACK | Freq: Two times a day (BID) | ORAL | Status: AC
  Administered 2020-09-04: 17 g via ORAL
  Filled 2020-09-04: qty 1

## 2020-09-04 MED ORDER — POLYETHYLENE GLYCOL 3350 17 G PO PACK
17.0000 g | PACK | Freq: Every day | ORAL | Status: DC | PRN
  Administered 2020-09-08: 17 g via ORAL
  Filled 2020-09-04: qty 1

## 2020-09-04 MED ORDER — LEVETIRACETAM 500 MG PO TABS
1000.0000 mg | ORAL_TABLET | Freq: Two times a day (BID) | ORAL | Status: AC
  Administered 2020-09-04 – 2020-09-09 (×11): 1000 mg via ORAL
  Filled 2020-09-04 (×11): qty 2

## 2020-09-04 MED ORDER — FUROSEMIDE 40 MG PO TABS
40.0000 mg | ORAL_TABLET | Freq: Every day | ORAL | Status: DC
  Administered 2020-09-04: 40 mg via ORAL
  Filled 2020-09-04: qty 2

## 2020-09-04 MED ORDER — ALBUTEROL SULFATE (2.5 MG/3ML) 0.083% IN NEBU: 3.0000 mL | INHALATION_SOLUTION | RESPIRATORY_TRACT | Status: DC | PRN

## 2020-09-04 MED ORDER — GABAPENTIN 300 MG PO CAPS: 600.0000 mg | ORAL_CAPSULE | Freq: Three times a day (TID) | ORAL | Status: DC | PRN

## 2020-09-04 MED ORDER — INSULIN ASPART 100 UNIT/ML IJ SOLN
0.0000 [IU] | Freq: Three times a day (TID) | INTRAMUSCULAR | Status: DC
  Administered 2020-09-05 – 2020-09-06 (×4): 3 [IU] via SUBCUTANEOUS
  Administered 2020-09-06 – 2020-09-07 (×2): 5 [IU] via SUBCUTANEOUS
  Administered 2020-09-07: 3 [IU] via SUBCUTANEOUS
  Administered 2020-09-07: 8 [IU] via SUBCUTANEOUS
  Administered 2020-09-08 (×3): 5 [IU] via SUBCUTANEOUS
  Administered 2020-09-09 (×2): 3 [IU] via SUBCUTANEOUS
  Administered 2020-09-09: 2 [IU] via SUBCUTANEOUS
  Administered 2020-09-10: 8 [IU] via SUBCUTANEOUS
  Administered 2020-09-10: 3 [IU] via SUBCUTANEOUS
  Administered 2020-09-11: 8 [IU] via SUBCUTANEOUS
  Administered 2020-09-11 – 2020-09-12 (×3): 5 [IU] via SUBCUTANEOUS
  Administered 2020-09-12 – 2020-09-13 (×5): 3 [IU] via SUBCUTANEOUS

## 2020-09-04 MED ORDER — PANTOPRAZOLE SODIUM 40 MG PO TBEC
40.0000 mg | DELAYED_RELEASE_TABLET | Freq: Every day | ORAL | Status: DC
  Administered 2020-09-04 – 2020-09-10 (×6): 40 mg via ORAL
  Filled 2020-09-04 (×6): qty 1

## 2020-09-04 MED ORDER — PEG-KCL-NACL-NASULF-NA ASC-C 100 G PO SOLR: 1.0000 | Freq: Once | ORAL | Status: DC

## 2020-09-04 MED ORDER — HEPARIN SODIUM (PORCINE) 5000 UNIT/ML IJ SOLN
5000.0000 [IU] | Freq: Three times a day (TID) | INTRAMUSCULAR | Status: AC
  Administered 2020-09-04 – 2020-09-09 (×15): 5000 [IU] via SUBCUTANEOUS
  Filled 2020-09-04 (×15): qty 1

## 2020-09-04 MED ORDER — GABAPENTIN 300 MG PO CAPS: 600.0000 mg | ORAL_CAPSULE | Freq: Three times a day (TID) | ORAL | Status: DC

## 2020-09-04 MED ORDER — LISINOPRIL 40 MG PO TABS
40.0000 mg | ORAL_TABLET | Freq: Every day | ORAL | Status: DC
  Administered 2020-09-04 – 2020-09-10 (×6): 40 mg via ORAL
  Filled 2020-09-04 (×2): qty 1
  Filled 2020-09-04: qty 2
  Filled 2020-09-04 (×3): qty 1

## 2020-09-04 MED ORDER — HYDROCHLOROTHIAZIDE 25 MG PO TABS
25.0000 mg | ORAL_TABLET | Freq: Every day | ORAL | Status: DC
  Administered 2020-09-04 – 2020-09-10 (×6): 25 mg via ORAL
  Filled 2020-09-04 (×6): qty 1

## 2020-09-04 MED ORDER — AMLODIPINE BESYLATE 10 MG PO TABS
10.0000 mg | ORAL_TABLET | Freq: Every day | ORAL | Status: DC
  Administered 2020-09-04 – 2020-09-10 (×6): 10 mg via ORAL
  Filled 2020-09-04: qty 2
  Filled 2020-09-04 (×5): qty 1

## 2020-09-04 MED ORDER — ATORVASTATIN CALCIUM 10 MG PO TABS
20.0000 mg | ORAL_TABLET | Freq: Every day | ORAL | Status: DC
  Administered 2020-09-04 – 2020-09-09 (×6): 20 mg via ORAL
  Filled 2020-09-04 (×6): qty 2

## 2020-09-04 MED ORDER — GABAPENTIN 300 MG PO CAPS
300.0000 mg | ORAL_CAPSULE | Freq: Three times a day (TID) | ORAL | Status: DC
  Administered 2020-09-04: 300 mg via ORAL
  Filled 2020-09-04: qty 1

## 2020-09-04 MED ORDER — IOHEXOL 300 MG/ML  SOLN
100.0000 mL | Freq: Once | INTRAMUSCULAR | Status: AC | PRN
  Administered 2020-09-04: 100 mL via INTRAVENOUS

## 2020-09-04 MED ORDER — SODIUM CHLORIDE 0.9 % IV BOLUS
500.0000 mL | Freq: Once | INTRAVENOUS | Status: AC
  Administered 2020-09-04: 500 mL via INTRAVENOUS

## 2020-09-04 NOTE — H&P (View-Only) (Signed)
Consultation  Referring Provider:   Dr. [name]   Primary Care Physician:  Benito Mccreedy, MD Primary Gastroenterologist: Althia Forts (patient dismissed from [facility])        Reason for Consultation: Abnormal CT the abdomen            HPI:   Thomas [name] is a 65 y.o. male with a past medical history as listed below including diabetes, asthma, epilepsy, reflux and others, who presented to the ER today with a complaint of chest, abdominal and back pain over the past month.  We are consulted in regards to findings of a possible colon mass on CT of the abdomen.    Today, the patient explains that he has had intermittent chest, abdominal and back pain for the past month.  Typically this was off and on and initially his PCP started him on Gas-X.  The patient himself started MiraLAX because he was also experiencing some constipation at the time which he thought may can be contributing to his pain.  For the first 2 weeks this helped to decrease the frequency of these episodes of pain, but over the past week or so had become pretty constant with pain radiating through from his epigastric area to his back and down throughout his abdomen. Rated as a 4-5/10. Tells me that recently he had been using MiraLAX and his bowel movements have been better, but just last night he felt like he was constipated so took more MiraLAX and normal.  He did have a good bowel movement this morning.  Associated symptoms include occasional nausea though this has not been hindering his appetite.    Describes a prior colonoscopy with [facility] in town.  He thinks this was more than 10 years ago.  Also describes a family history of a lot of "cancers" in his family, mostly breast cancer on his mother side, no colon cancer.    Denies fever, chills, weight loss, blood in his stool, vomiting, heartburn or reflux.  Past Medical History:  Diagnosis Date  . Asthma    as a child  . 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 Confusion     occasionally  . Diabetes mellitus without complication ([place])    takes Metformin daily.  . Dizziness   . Dyspnea    pt states d/t weight  . Epilepsy ([place])    takes Keppra daily  . GERD (gastroesophageal reflux disease)    takes Omeprazole daily  . Headache   . Hyperlipidemia    takes Atorvastatin daily  . Hypertension    takes Lotrel daily  . Peripheral edema    takes Lasix daily  . Peripheral neuropathy    takes Gabapentin daily  . Pneumonia 3 yrs ago   hx of  . Seizures ([place])   . Sleep apnea   . Urinary frequency     Past Surgical History:  Procedure Laterality Date  . CARDIAC CATHETERIZATION  7 yrs ago  . CRANIOTOMY N/A 09/01/2016   Procedure: CRANIOTOMY TUMOR  LEFT PTERIONAL;  Surgeon: Ashok Pall, MD;  Location: [facility];  Service: Neurosurgery;  Laterality: N/A;  . cyst removed from chest      as a child  . NO PAST SURGERIES      Family History  Family history unknown: Yes  Breast Cancer in his mom  Social History   Tobacco Use  . Smoking status: Former Research scientist (life sciences)  . Smokeless tobacco: Never Used  . Tobacco comment: quit  smoking 35 yrs ago  Substance Use Topics  . Alcohol use: No  . Drug use: No    Prior to Admission medications   Medication Sig Start Date End Date Taking? Authorizing Provider  acetaminophen (TYLENOL) 500 MG tablet Take 1,000 mg by mouth every 6 (six) hours as needed for headache (pain).    Yes [provider]  albuterol (PROVENTIL HFA;VENTOLIN HFA) 108 (90 BASE) MCG/ACT inhaler Inhale 2 puffs into the lungs every 4 (four) hours as needed for wheezing or shortness of breath. 04/09/15  Yes Muthersbaugh, Jarrett Soho, PA-C  amLODipine (NORVASC) 10 MG tablet Take 10 mg by mouth daily. 05/11/20  Yes [provider]  atorvastatin (LIPITOR) 20 MG tablet Take 20 mg by mouth daily at 6 PM.    Yes [provider]  calcium carbonate (TUMS - DOSED IN MG ELEMENTAL CALCIUM) 500 MG chewable tablet Chew 1-2 tablets by mouth 2 (two) times  daily as needed for indigestion or heartburn (DEPENDS ON INDIGESTION IF TAKES 1-2 TABLETS).   Yes [provider]  Chlorpheniramine-Acetaminophen (CORICIDIN HBP COLD/FLU PO) Take 2 tablets by mouth 2 (two) times daily as needed (COLD SYMPTOMS).   Yes [provider]  furosemide (LASIX) 20 MG tablet Take 2 tablets (40 mg total) by mouth daily. 04/06/16  Yes Varney Biles, MD  gabapentin (NEURONTIN) 300 MG capsule Take 1 capsule (300 mg total) by mouth 3 (three) times daily. 04/06/16  Yes Nanavati, Ankit, MD  hydrochlorothiazide (HYDRODIURIL) 25 MG tablet Take 25 mg by mouth daily. 08/21/20  Yes [provider]  levETIRAcetam (KEPPRA) 1000 MG tablet Take 1,000 mg by mouth 2 (two) times daily. 07/28/16  Yes [provider]  lisinopril (ZESTRIL) 40 MG tablet Take 40 mg by mouth daily. 08/19/20  Yes [provider]  metFORMIN (GLUCOPHAGE-XR) 500 MG 24 hr tablet Take 500 mg by mouth 2 (two) times daily. 07/27/20  Yes [provider]  omeprazole (PRILOSEC) 20 MG capsule Take 20 mg by mouth daily. 08/10/16  Yes [provider]  polyethylene glycol powder (GLYCOLAX/MIRALAX) 17 GM/SCOOP powder Take 17 g by mouth daily as needed for mild constipation.   Yes [provider]  Simethicone (GAS-X PO) Take 1 tablet by mouth daily as needed (stomach/gas).   Yes [provider]  UNABLE TO FIND 2 (two) times a week. Herbal Life Protein Powder: Mix into a shake and drink once a day   Yes [provider]  Lacosamide (VIMPAT) 100 MG TABS Take 1 tablet (100 mg total) by mouth 2 (two) times daily. Patient not taking: Reported on 05/25/2016 04/06/16   Varney Biles, MD    Current Facility-Administered Medications  Medication Dose Route Frequency Provider Last Rate Last Admin  . albuterol (PROVENTIL) (2.5 MG/3ML) 0.083% nebulizer solution 3 mL  3 mL Inhalation Q4H PRN Ezequiel Essex, MD      . amLODipine (NORVASC) tablet 10 mg  10 mg  Oral Daily Ezequiel Essex, MD      . atorvastatin (LIPITOR) tablet 20 mg  20 mg Oral q1800 Ezequiel Essex, MD      . furosemide (LASIX) tablet 40 mg  40 mg Oral Daily Ezequiel Essex, MD      . gabapentin (NEURONTIN) capsule 300 mg  300 mg Oral TID Ezequiel Essex, MD      . heparin injection 5,000 Units  5,000 Units Subcutaneous Q8H Ellouise Newer [place], [place]      . hydrochlorothiazide (HYDRODIURIL) tablet 25 mg  25 mg Oral Daily Ezequiel Essex,  MD      . levETIRAcetam (KEPPRA) tablet 1,000 mg  1,000 mg Oral BID Ezequiel Essex, MD      . lisinopril (ZESTRIL) tablet 40 mg  40 mg Oral Daily Ezequiel Essex, MD      . pantoprazole (PROTONIX) EC tablet 40 mg  40 mg Oral Daily Ezequiel Essex, MD      . peg 3350 powder (MOVIPREP) kit 200 g  1 kit Oral Once Levin Erp, PA      . polyethylene glycol (MIRALAX / GLYCOLAX) packet 17 g  17 g Oral Daily PRN Ezequiel Essex, MD      . polyethylene glycol (MIRALAX / GLYCOLAX) packet 17 g  17 g Oral BID Ezequiel Essex, MD       Current Outpatient Medications  Medication Sig Dispense Refill  . acetaminophen (TYLENOL) 500 MG tablet Take 1,000 mg by mouth every 6 (six) hours as needed for headache (pain).     Marland Kitchen albuterol (PROVENTIL HFA;VENTOLIN HFA) 108 (90 BASE) MCG/ACT inhaler Inhale 2 puffs into the lungs every 4 (four) hours as needed for wheezing or shortness of breath. 1 Inhaler 1  . amLODipine (NORVASC) 10 MG tablet Take 10 mg by mouth daily.    Marland Kitchen atorvastatin (LIPITOR) 20 MG tablet Take 20 mg by mouth daily at 6 PM.     . calcium carbonate (TUMS - DOSED IN MG ELEMENTAL CALCIUM) 500 MG chewable tablet Chew 1-2 tablets by mouth 2 (two) times daily as needed for indigestion or heartburn (DEPENDS ON INDIGESTION IF TAKES 1-2 TABLETS).    . Chlorpheniramine-Acetaminophen (CORICIDIN HBP COLD/FLU PO) Take 2 tablets by mouth 2 (two) times daily as needed (COLD SYMPTOMS).    . furosemide (LASIX) 20 MG tablet Take 2 tablets (40 mg total) by mouth  daily. 60 tablet 3  . gabapentin (NEURONTIN) 300 MG capsule Take 1 capsule (300 mg total) by mouth 3 (three) times daily. 90 capsule 1  . hydrochlorothiazide (HYDRODIURIL) 25 MG tablet Take 25 mg by mouth daily.    Marland Kitchen levETIRAcetam (KEPPRA) 1000 MG tablet Take 1,000 mg by mouth 2 (two) times daily.  0  . lisinopril (ZESTRIL) 40 MG tablet Take 40 mg by mouth daily.    . metFORMIN (GLUCOPHAGE-XR) 500 MG 24 hr tablet Take 500 mg by mouth 2 (two) times daily.    Marland Kitchen omeprazole (PRILOSEC) 20 MG capsule Take 20 mg by mouth daily.  0  . polyethylene glycol powder (GLYCOLAX/MIRALAX) 17 GM/SCOOP powder Take 17 g by mouth daily as needed for mild constipation.    . Simethicone (GAS-X PO) Take 1 tablet by mouth daily as needed (stomach/gas).    Marland Kitchen UNABLE TO FIND 2 (two) times a week. Herbal Life Protein Powder: Mix into a shake and drink once a day    . Lacosamide (VIMPAT) 100 MG TABS Take 1 tablet (100 mg total) by mouth 2 (two) times daily. (Patient not taking: Reported on 05/25/2016) 60 tablet 2    Allergies as of 09/04/2020 - Review Complete 09/04/2020  Allergen Reaction Noted  . No known allergies  08/31/2016     Review of Systems:    Constitutional: No weight loss, fever or chills Skin: No rash  Cardiovascular: No chest pain Respiratory: No SOB Gastrointestinal: See HPI and otherwise negative Genitourinary: No dysuria  Neurological: No headache, dizziness or syncope Musculoskeletal: No new muscle or joint pain Hematologic: No bleeding Psychiatric: No history of depression or anxiety    Physical Exam:  Vital signs in last  24 hours: Temp:  [98.1 F (36.7 C)] 98.1 F (36.7 C) (04/28 0652) Pulse Rate:  [62-96] 72 (04/28 1345) Resp:  [10-18] 15 (04/28 1345) BP: (124-157)/(67-86) 156/80 (04/28 1345) SpO2:  [92 %-100 %] 96 % (04/28 1345)   General:   Pleasant obese AA male appears to be in NAD, Well developed, Well nourished, alert and cooperative Head:  Normocephalic and atraumatic. Eyes:    PEERL, EOMI. No icterus. Conjunctiva pink. Ears:  Normal auditory acuity. Neck:  Supple Throat: Oral cavity and pharynx without inflammation, swelling or lesion.  Lungs: Respirations even and unlabored. Lungs clear to auscultation bilaterally.   No wheezes, crackles, or rhonchi.  Heart: Normal S1, S2. No MRG. Regular rate and rhythm. No peripheral edema, cyanosis or pallor.  Abdomen:  Soft, nondistended, mild generalized ttp, some worse in epigastrum. No rebound or guarding. Normal bowel sounds. No appreciable masses or hepatomegaly. Rectal:  Not performed.  Msk:  Symmetrical without gross deformities. Peripheral pulses intact.  Extremities:  Without edema, no deformity or joint abnormality.  Neurologic:  Alert and  oriented x4;  grossly normal neurologically.  Skin:   Dry and intact without significant lesions or rashes. Psychiatric: Demonstrates good judgement and reason without abnormal affect or behaviors.   LAB RESULTS: Recent Labs    09/04/20 0659  WBC 13.2*  13.3*  HGB 12.6*  12.7*  HCT 37.3*  37.2*  PLT 381  368   BMET Recent Labs    09/04/20 0659  NA 135  K 4.7  CL 97*  CO2 29  GLUCOSE 227*  BUN 16  CREATININE 1.34*  CALCIUM 9.4   LFT Recent Labs    09/04/20 0905  PROT 6.6  ALBUMIN 3.0*  AST 13*  ALT 19  ALKPHOS 73  BILITOT 0.5  BILIDIR <0.1  IBILI NOT CALCULATED   STUDIES: DG Chest 2 View  Result Date: 09/04/2020 CLINICAL DATA:  Chest pain. EXAM: CHEST - 2 VIEW COMPARISON:  04/01/2017. FINDINGS: Mediastinum and hilar structures normal. Heart size normal. No focal infiltrate. No pleural effusion or pneumothorax. No acute bony abnormality. IMPRESSION: No acute cardiopulmonary disease. Electronically Signed   By: Marcello Moores  Register   On: 09/04/2020 07:13   CT Abdomen Pelvis W Contrast  Result Date: 09/04/2020 CLINICAL DATA:  65 year old male with abdominal pain and weakness. EXAM: CT ABDOMEN AND PELVIS WITH CONTRAST TECHNIQUE: Multidetector CT  imaging of the abdomen and pelvis was performed using the standard protocol following bolus administration of intravenous contrast. CONTRAST:  1106m OMNIPAQUE IOHEXOL 300 MG/ML  SOLN COMPARISON:  CT Abdomen and Pelvis 06/07/2009. FINDINGS: Lower chest: Negative; stable tiny calcified granuloma in the left lower lobe on series 5, image 2. Hepatobiliary: Liver enhancement is within normal limits. Negative gallbladder. No bile duct enlargement. Pancreas: Negative. Spleen: Negative. Adrenals/Urinary Tract: Normal adrenal glands. Kidneys appear little changed since 2011 and within normal limits. A simple fluid density left renal midpole cyst has mildly enlarged since that time. Smaller similar appearing right renal midpole cyst suspected. Symmetric renal enhancement and contrast excretion. No nephrolithiasis. Negative ureters and bladder. Stomach/Bowel: Possible large bowel peristalsis in the mid transverse colon on series 3, image 40. Although there are small but conspicuous regional lymph nodes which are increased from 2011 (coronal image 39. The transverse colon segment and question is about 3.7 cm in length. No dilated upstream large bowel. Negative colon otherwise. Normal retrocecal appendix on series 3, image 57. Decompressed and negative terminal ileum. No dilated small bowel. Decompressed and negative stomach, duodenum.  No free air, free fluid. Vascular/Lymphatic: Aortoiliac calcified atherosclerosis. Major arterial structures in the abdomen and pelvis are patent. Normal caliber abdominal aorta. Patent portal venous system. Reproductive: Stable small fat containing left inguinal hernia. Other: No pelvic free fluid. There is mild pelvic lipomatosis (series 3, image 84). Musculoskeletal: No acute osseous abnormality identified. 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 IMPRESSION: 1. Cannot exclude a 3.7 cm length apple-core type  Mass/Carcinoma of the mid transverse colon, although artifact due to large bowel peristalsis is possible. Regional lymph nodes are small but increased since 2011. Recommend large bowel Endoscopy. 2. Otherwise stable, negative CT Abdomen and Pelvis. Aortic Atherosclerosis (ICD10-I70.0). Electronically Signed   By: Genevie Ann M.D.   On: 09/04/2020 08:51    Impression / Plan:   Impression: 1.  Abnormal CT of the abdomen: Showing possible colon mass as above, last colonoscopy year than 10 years per the patient, we do not have report 2.  Abdominal pain: Generalized which seem to radiate through to his back, some better after Gas-X and MiraLAX initially, but now pretty constant 3.  Constipation: Had developed constipation recently and was using MiraLAX which was helpful  Plan: 1.  Scheduled patient for colonoscopy tomorrow with Dr. Henrene Pastor.  Did discuss risks, benefits, limitations and alternatives and patient agrees to proceed. 2.  Patient will be on clear liquid diet today and n.p.o. at midnight. 3.  Ordered movi prep in split dose fashion. 4.  Please await further recommendations from Dr. Henrene Pastor later today.  Thank you for your kind consultation, we will continue to follow.  Lavone [facility]  09/04/2020, 2:44 PM   GI ATTENDING  History, laboratories, x-rays reviewed.  Agree with comprehensive consultation note as outlined above.  Agree with impression and plans as outlined above.  Plans for colonoscopy tomorrow to rule out possible transverse colon mass.The nature of the procedure, as well as the risks, benefits, and alternatives were carefully and thoroughly reviewed with the patient. Ample time for discussion and questions allowed. The patient understood, was satisfied, and agreed to proceed.  Docia Chuck. Geri Seminole., M.D. [facility] Division of Gastroenterology

## 2020-09-04 NOTE — Consult Note (Addendum)
   Consultation  Referring Provider:   Dr. Chambliss   Primary Care Physician:  Osei-Bonsu, George, MD Primary Gastroenterologist: Unassigned (patient dismissed from [facility])        Reason for Consultation: Abnormal CT the abdomen            HPI:   Thomas [name] is a 65 y.o. male with a past medical history as listed below including diabetes, asthma, epilepsy, reflux and others, who presented to the ER today with a complaint of chest, abdominal and back pain over the past month.  We are consulted in regards to findings of a possible colon mass on CT of the abdomen.    Today, the patient explains that he has had intermittent chest, abdominal and back pain for the past month.  Typically this was off and on and initially his PCP started him on Gas-X.  The patient himself started MiraLAX because he was also experiencing some constipation at the time which he thought may can be contributing to his pain.  For the first 2 weeks this helped to decrease the frequency of these episodes of pain, but over the past week or so had become pretty constant with pain radiating through from his epigastric area to his back and down throughout his abdomen. Rated as a 4-5/10. Tells me that recently he had been using MiraLAX and his bowel movements have been better, but just last night he felt like he was constipated so took more MiraLAX and normal.  He did have a good bowel movement this morning.  Associated symptoms include occasional nausea though this has not been hindering his appetite.    Describes a prior colonoscopy with [facility] in town.  He thinks this was more than 10 years ago.  Also describes a family history of a lot of "cancers" in his family, mostly breast cancer on his mother side, no colon cancer.    Denies fever, chills, weight loss, blood in his stool, vomiting, heartburn or reflux.  Past Medical History:  Diagnosis Date  . Asthma    as a child  . Brain tumor (HCC)    frontal  . Confusion     occasionally  . Diabetes mellitus without complication (HCC)    takes Metformin daily.  . Dizziness   . Dyspnea    pt states d/t weight  . Epilepsy (HCC)    takes Keppra daily  . GERD (gastroesophageal reflux disease)    takes Omeprazole daily  . Headache   . Hyperlipidemia    takes Atorvastatin daily  . Hypertension    takes Lotrel daily  . Peripheral edema    takes Lasix daily  . Peripheral neuropathy    takes Gabapentin daily  . Pneumonia 3 yrs ago   hx of  . Seizures (HCC)   . Sleep apnea   . Urinary frequency     Past Surgical History:  Procedure Laterality Date  . CARDIAC CATHETERIZATION  7 yrs ago  . CRANIOTOMY N/A 09/01/2016   Procedure: CRANIOTOMY TUMOR  LEFT PTERIONAL;  Surgeon: Kyle Cabbell, MD;  Location: [facility] OR;  Service: Neurosurgery;  Laterality: N/A;  . cyst removed from chest      as a child  . NO PAST SURGERIES      Family History  Family history unknown: Yes  Breast Cancer in his mom  Social History   Tobacco Use  . Smoking status: Former Smoker  . Smokeless tobacco: Never Used  . Tobacco comment: quit   smoking 35 yrs ago  Substance Use Topics  . Alcohol use: No  . Drug use: No    Prior to Admission medications   Medication Sig Start Date End Date Taking? Authorizing Provider  acetaminophen (TYLENOL) 500 MG tablet Take 1,000 mg by mouth every 6 (six) hours as needed for headache (pain).    Yes [provider]  albuterol (PROVENTIL HFA;VENTOLIN HFA) 108 (90 BASE) MCG/ACT inhaler Inhale 2 puffs into the lungs every 4 (four) hours as needed for wheezing or shortness of breath. 04/09/15  Yes Muthersbaugh, Hannah, PA-C  amLODipine (NORVASC) 10 MG tablet Take 10 mg by mouth daily. 05/11/20  Yes [provider]  atorvastatin (LIPITOR) 20 MG tablet Take 20 mg by mouth daily at 6 PM.    Yes [provider]  calcium carbonate (TUMS - DOSED IN MG ELEMENTAL CALCIUM) 500 MG chewable tablet Chew 1-2 tablets by mouth 2 (two) times  daily as needed for indigestion or heartburn (DEPENDS ON INDIGESTION IF TAKES 1-2 TABLETS).   Yes [provider]  Chlorpheniramine-Acetaminophen (CORICIDIN HBP COLD/FLU PO) Take 2 tablets by mouth 2 (two) times daily as needed (COLD SYMPTOMS).   Yes [provider]  furosemide (LASIX) 20 MG tablet Take 2 tablets (40 mg total) by mouth daily. 04/06/16  Yes Nanavati, Ankit, MD  gabapentin (NEURONTIN) 300 MG capsule Take 1 capsule (300 mg total) by mouth 3 (three) times daily. 04/06/16  Yes Nanavati, Ankit, MD  hydrochlorothiazide (HYDRODIURIL) 25 MG tablet Take 25 mg by mouth daily. 08/21/20  Yes [provider]  levETIRAcetam (KEPPRA) 1000 MG tablet Take 1,000 mg by mouth 2 (two) times daily. 07/28/16  Yes [provider]  lisinopril (ZESTRIL) 40 MG tablet Take 40 mg by mouth daily. 08/19/20  Yes [provider]  metFORMIN (GLUCOPHAGE-XR) 500 MG 24 hr tablet Take 500 mg by mouth 2 (two) times daily. 07/27/20  Yes [provider]  omeprazole (PRILOSEC) 20 MG capsule Take 20 mg by mouth daily. 08/10/16  Yes [provider]  polyethylene glycol powder (GLYCOLAX/MIRALAX) 17 GM/SCOOP powder Take 17 g by mouth daily as needed for mild constipation.   Yes [provider]  Simethicone (GAS-X PO) Take 1 tablet by mouth daily as needed (stomach/gas).   Yes [provider]  UNABLE TO FIND 2 (two) times a week. Herbal Life Protein Powder: Mix into a shake and drink once a day   Yes [provider]  Lacosamide (VIMPAT) 100 MG TABS Take 1 tablet (100 mg total) by mouth 2 (two) times daily. Patient not taking: Reported on 05/25/2016 04/06/16   Nanavati, Ankit, MD    Current Facility-Administered Medications  Medication Dose Route Frequency Provider Last Rate Last Admin  . albuterol (PROVENTIL) (2.5 MG/3ML) 0.083% nebulizer solution 3 mL  3 mL Inhalation Q4H PRN Lynn, Catherine, MD      . amLODipine (NORVASC) tablet 10 mg  10 mg  Oral Daily Lynn, Catherine, MD      . atorvastatin (LIPITOR) tablet 20 mg  20 mg Oral q1800 Lynn, Catherine, MD      . furosemide (LASIX) tablet 40 mg  40 mg Oral Daily Lynn, Catherine, MD      . gabapentin (NEURONTIN) capsule 300 mg  300 mg Oral TID Lynn, Catherine, MD      . heparin injection 5,000 Units  5,000 Units Subcutaneous Q8H Lemmon, Jennifer Lynne, PA      . hydrochlorothiazide (HYDRODIURIL) tablet 25 mg  25 mg Oral Daily Lynn, Catherine,   MD      . levETIRAcetam (KEPPRA) tablet 1,000 mg  1,000 mg Oral BID Lynn, Catherine, MD      . lisinopril (ZESTRIL) tablet 40 mg  40 mg Oral Daily Lynn, Catherine, MD      . pantoprazole (PROTONIX) EC tablet 40 mg  40 mg Oral Daily Lynn, Catherine, MD      . peg 3350 powder (MOVIPREP) kit 200 g  1 kit Oral Once Lemmon, Jennifer Lynne, PA      . polyethylene glycol (MIRALAX / GLYCOLAX) packet 17 g  17 g Oral Daily PRN Lynn, Catherine, MD      . polyethylene glycol (MIRALAX / GLYCOLAX) packet 17 g  17 g Oral BID Lynn, Catherine, MD       Current Outpatient Medications  Medication Sig Dispense Refill  . acetaminophen (TYLENOL) 500 MG tablet Take 1,000 mg by mouth every 6 (six) hours as needed for headache (pain).     . albuterol (PROVENTIL HFA;VENTOLIN HFA) 108 (90 BASE) MCG/ACT inhaler Inhale 2 puffs into the lungs every 4 (four) hours as needed for wheezing or shortness of breath. 1 Inhaler 1  . amLODipine (NORVASC) 10 MG tablet Take 10 mg by mouth daily.    . atorvastatin (LIPITOR) 20 MG tablet Take 20 mg by mouth daily at 6 PM.     . calcium carbonate (TUMS - DOSED IN MG ELEMENTAL CALCIUM) 500 MG chewable tablet Chew 1-2 tablets by mouth 2 (two) times daily as needed for indigestion or heartburn (DEPENDS ON INDIGESTION IF TAKES 1-2 TABLETS).    . Chlorpheniramine-Acetaminophen (CORICIDIN HBP COLD/FLU PO) Take 2 tablets by mouth 2 (two) times daily as needed (COLD SYMPTOMS).    . furosemide (LASIX) 20 MG tablet Take 2 tablets (40 mg total) by mouth  daily. 60 tablet 3  . gabapentin (NEURONTIN) 300 MG capsule Take 1 capsule (300 mg total) by mouth 3 (three) times daily. 90 capsule 1  . hydrochlorothiazide (HYDRODIURIL) 25 MG tablet Take 25 mg by mouth daily.    . levETIRAcetam (KEPPRA) 1000 MG tablet Take 1,000 mg by mouth 2 (two) times daily.  0  . lisinopril (ZESTRIL) 40 MG tablet Take 40 mg by mouth daily.    . metFORMIN (GLUCOPHAGE-XR) 500 MG 24 hr tablet Take 500 mg by mouth 2 (two) times daily.    . omeprazole (PRILOSEC) 20 MG capsule Take 20 mg by mouth daily.  0  . polyethylene glycol powder (GLYCOLAX/MIRALAX) 17 GM/SCOOP powder Take 17 g by mouth daily as needed for mild constipation.    . Simethicone (GAS-X PO) Take 1 tablet by mouth daily as needed (stomach/gas).    . UNABLE TO FIND 2 (two) times a week. Herbal Life Protein Powder: Mix into a shake and drink once a day    . Lacosamide (VIMPAT) 100 MG TABS Take 1 tablet (100 mg total) by mouth 2 (two) times daily. (Patient not taking: Reported on 05/25/2016) 60 tablet 2    Allergies as of 09/04/2020 - Review Complete 09/04/2020  Allergen Reaction Noted  . No known allergies  08/31/2016     Review of Systems:    Constitutional: No weight loss, fever or chills Skin: No rash  Cardiovascular: No chest pain Respiratory: No SOB Gastrointestinal: See HPI and otherwise negative Genitourinary: No dysuria  Neurological: No headache, dizziness or syncope Musculoskeletal: No new muscle or joint pain Hematologic: No bleeding Psychiatric: No history of depression or anxiety    Physical Exam:  Vital signs in last   24 hours: Temp:  [98.1 F (36.7 C)] 98.1 F (36.7 C) (04/28 0652) Pulse Rate:  [62-96] 72 (04/28 1345) Resp:  [10-18] 15 (04/28 1345) BP: (124-157)/(67-86) 156/80 (04/28 1345) SpO2:  [92 %-100 %] 96 % (04/28 1345)   General:   Pleasant obese AA male appears to be in NAD, Well developed, Well nourished, alert and cooperative Head:  Normocephalic and atraumatic. Eyes:    PEERL, EOMI. No icterus. Conjunctiva pink. Ears:  Normal auditory acuity. Neck:  Supple Throat: Oral cavity and pharynx without inflammation, swelling or lesion.  Lungs: Respirations even and unlabored. Lungs clear to auscultation bilaterally.   No wheezes, crackles, or rhonchi.  Heart: Normal S1, S2. No MRG. Regular rate and rhythm. No peripheral edema, cyanosis or pallor.  Abdomen:  Soft, nondistended, mild generalized ttp, some worse in epigastrum. No rebound or guarding. Normal bowel sounds. No appreciable masses or hepatomegaly. Rectal:  Not performed.  Msk:  Symmetrical without gross deformities. Peripheral pulses intact.  Extremities:  Without edema, no deformity or joint abnormality.  Neurologic:  Alert and  oriented x4;  grossly normal neurologically.  Skin:   Dry and intact without significant lesions or rashes. Psychiatric: Demonstrates good judgement and reason without abnormal affect or behaviors.   LAB RESULTS: Recent Labs    09/04/20 0659  WBC 13.2*  13.3*  HGB 12.6*  12.7*  HCT 37.3*  37.2*  PLT 381  368   BMET Recent Labs    09/04/20 0659  NA 135  K 4.7  CL 97*  CO2 29  GLUCOSE 227*  BUN 16  CREATININE 1.34*  CALCIUM 9.4   LFT Recent Labs    09/04/20 0905  PROT 6.6  ALBUMIN 3.0*  AST 13*  ALT 19  ALKPHOS 73  BILITOT 0.5  BILIDIR <0.1  IBILI NOT CALCULATED   STUDIES: DG Chest 2 View  Result Date: 09/04/2020 CLINICAL DATA:  Chest pain. EXAM: CHEST - 2 VIEW COMPARISON:  04/01/2017. FINDINGS: Mediastinum and hilar structures normal. Heart size normal. No focal infiltrate. No pleural effusion or pneumothorax. No acute bony abnormality. IMPRESSION: No acute cardiopulmonary disease. Electronically Signed   By: Thomas  Register   On: 09/04/2020 07:13   CT Abdomen Pelvis W Contrast  Result Date: 09/04/2020 CLINICAL DATA:  64-year-old male with abdominal pain and weakness. EXAM: CT ABDOMEN AND PELVIS WITH CONTRAST TECHNIQUE: Multidetector CT  imaging of the abdomen and pelvis was performed using the standard protocol following bolus administration of intravenous contrast. CONTRAST:  100mL OMNIPAQUE IOHEXOL 300 MG/ML  SOLN COMPARISON:  CT Abdomen and Pelvis 06/07/2009. FINDINGS: Lower chest: Negative; stable tiny calcified granuloma in the left lower lobe on series 5, image 2. Hepatobiliary: Liver enhancement is within normal limits. Negative gallbladder. No bile duct enlargement. Pancreas: Negative. Spleen: Negative. Adrenals/Urinary Tract: Normal adrenal glands. Kidneys appear little changed since 2011 and within normal limits. A simple fluid density left renal midpole cyst has mildly enlarged since that time. Smaller similar appearing right renal midpole cyst suspected. Symmetric renal enhancement and contrast excretion. No nephrolithiasis. Negative ureters and bladder. Stomach/Bowel: Possible large bowel peristalsis in the mid transverse colon on series 3, image 40. Although there are small but conspicuous regional lymph nodes which are increased from 2011 (coronal image 39. The transverse colon segment and question is about 3.7 cm in length. No dilated upstream large bowel. Negative colon otherwise. Normal retrocecal appendix on series 3, image 57. Decompressed and negative terminal ileum. No dilated small bowel. Decompressed and negative stomach, duodenum.   No free air, free fluid. Vascular/Lymphatic: Aortoiliac calcified atherosclerosis. Major arterial structures in the abdomen and pelvis are patent. Normal caliber abdominal aorta. Patent portal venous system. Reproductive: Stable small fat containing left inguinal hernia. Other: No pelvic free fluid. There is mild pelvic lipomatosis (series 3, image 84). Musculoskeletal: No acute osseous abnormality identified. 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 IMPRESSION: 1. Cannot exclude a 3.7 cm length apple-core type  Mass/Carcinoma of the mid transverse colon, although artifact due to large bowel peristalsis is possible. Regional lymph nodes are small but increased since 2011. Recommend large bowel Endoscopy. 2. Otherwise stable, negative CT Abdomen and Pelvis. Aortic Atherosclerosis (ICD10-I70.0). Electronically Signed   By: H  Hall M.D.   On: 09/04/2020 08:51    Impression / Plan:   Impression: 1.  Abnormal CT of the abdomen: Showing possible colon mass as above, last colonoscopy year than 10 years per the patient, we do not have report 2.  Abdominal pain: Generalized which seem to radiate through to his back, some better after Gas-X and MiraLAX initially, but now pretty constant 3.  Constipation: Had developed constipation recently and was using MiraLAX which was helpful  Plan: 1.  Scheduled patient for colonoscopy tomorrow with Dr. Tymel Conely.  Did discuss risks, benefits, limitations and alternatives and patient agrees to proceed. 2.  Patient will be on clear liquid diet today and n.p.o. at midnight. 3.  Ordered movi prep in split dose fashion. 4.  Please await further recommendations from Dr. Marv Alfrey later today.  Thank you for your kind consultation, we will continue to follow.  Jennifer Lynne Lemmon  09/04/2020, 2:44 PM   GI ATTENDING  History, laboratories, x-rays reviewed.  Agree with comprehensive consultation note as outlined above.  Agree with impression and plans as outlined above.  Plans for colonoscopy tomorrow to rule out possible transverse colon mass.The nature of the procedure, as well as the risks, benefits, and alternatives were carefully and thoroughly reviewed with the patient. Ample time for discussion and questions allowed. The patient understood, was satisfied, and agreed to proceed.  Cayce Paschal N. Orma Cheetham, Jr., M.D. [facility] Division of Gastroenterology   

## 2020-09-04 NOTE — ED Provider Notes (Signed)
[facility]ERGENCY DEPARTMENT Provider Note   CSN: [id number] Arrival date & time: 09/04/20  0645     History Chief Complaint  Patient presents with  . Chest Pain  . Abdominal Pain    Thomas [name] is a 65 y.o. male.  65 year old male with history of DM, asthma, epilepsy, GERD, presents with complaint of chest, abdominal, back pain for the past month. Patient has been to his PCP who recommended Miralax and antacids which have helped but pain persists. Patient was advised to have a CT scan but has not gotten this scheduled. Last colonoscopy was 10 years ago and was normal. Pain is worse with eating, generalized abdomen. Denies prior abdominal surgeries.         Past Medical History:  Diagnosis Date  . Asthma    as a child  . 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 Confusion    occasionally  . Diabetes mellitus without complication ([place])    takes Metformin daily.  . Dizziness   . Dyspnea    pt states d/t weight  . Epilepsy ([place])    takes Keppra daily  . GERD (gastroesophageal reflux disease)    takes Omeprazole daily  . Headache   . Hyperlipidemia    takes Atorvastatin daily  . Hypertension    takes Lotrel daily  . Peripheral edema    takes Lasix daily  . Peripheral neuropathy    takes Gabapentin daily  . Pneumonia 3 yrs ago   hx of  . Seizures ([place])   . Sleep apnea   . Urinary frequency     Patient Active Problem List   Diagnosis Date Noted  . S/P craniotomy 09/01/2016  . Meningioma ([place]) 09/01/2016  . Trauma   . Diabetes mellitus type II, controlled ([place])   . Essential hypertension   . Aspiration pneumonia ([place])   . Acute encephalopathy 04/09/2014  . Acute ischemic left MCA stroke ([place]) 04/09/2014  . Acute respiratory failure with hypoxia ([place]) 04/09/2014  . Lactic acidosis 04/09/2014  . Hyperammonemia ([place]) 04/09/2014    Past Surgical History:  Procedure Laterality Date  . CARDIAC CATHETERIZATION  7 yrs ago  . CRANIOTOMY N/A  09/01/2016   Procedure: CRANIOTOMY TUMOR  LEFT PTERIONAL;  Surgeon: Ashok Pall, MD;  Location: [facility];  Service: Neurosurgery;  Laterality: N/A;  . cyst removed from chest      as a child  . NO PAST SURGERIES         Family History  Family history unknown: Yes    Social History   Tobacco Use  . Smoking status: Former Research scientist (life sciences)  . Smokeless tobacco: Never Used  . Tobacco comment: quit smoking 35 yrs ago  Substance Use Topics  . Alcohol use: No  . Drug use: No    Home Medications Prior to Admission medications   Medication Sig Start Date End Date Taking? Authorizing Provider  acetaminophen (TYLENOL) 500 MG tablet Take 1,000 mg by mouth every 6 (six) hours as needed for headache (pain).    Yes [provider]  albuterol (PROVENTIL HFA;VENTOLIN HFA) 108 (90 BASE) MCG/ACT inhaler Inhale 2 puffs into the lungs every 4 (four) hours as needed for wheezing or shortness of breath. 04/09/15  Yes Muthersbaugh, Jarrett Soho, PA-C  amLODipine (NORVASC) 10 MG tablet Take 10 mg by mouth daily. 05/11/20  Yes [provider]  atorvastatin (LIPITOR) 20 MG tablet Take 20 mg by mouth daily at 6 PM.  Yes [provider]  calcium carbonate (TUMS - DOSED IN MG ELEMENTAL CALCIUM) 500 MG chewable tablet Chew 1-2 tablets by mouth 2 (two) times daily as needed for indigestion or heartburn (DEPENDS ON INDIGESTION IF TAKES 1-2 TABLETS).   Yes [provider]  Chlorpheniramine-Acetaminophen (CORICIDIN HBP COLD/FLU PO) Take 2 tablets by mouth 2 (two) times daily as needed (COLD SYMPTOMS).   Yes [provider]  furosemide (LASIX) 20 MG tablet Take 2 tablets (40 mg total) by mouth daily. 04/06/16  Yes Varney Biles, MD  gabapentin (NEURONTIN) 300 MG capsule Take 1 capsule (300 mg total) by mouth 3 (three) times daily. 04/06/16  Yes Nanavati, Ankit, MD  hydrochlorothiazide (HYDRODIURIL) 25 MG tablet Take 25 mg by mouth daily. 08/21/20  Yes [provider]   levETIRAcetam (KEPPRA) 1000 MG tablet Take 1,000 mg by mouth 2 (two) times daily. 07/28/16  Yes [provider]  lisinopril (ZESTRIL) 40 MG tablet Take 40 mg by mouth daily. 08/19/20  Yes [provider]  metFORMIN (GLUCOPHAGE-XR) 500 MG 24 hr tablet Take 500 mg by mouth 2 (two) times daily. 07/27/20  Yes [provider]  omeprazole (PRILOSEC) 20 MG capsule Take 20 mg by mouth daily. 08/10/16  Yes [provider]  polyethylene glycol powder (GLYCOLAX/MIRALAX) 17 GM/SCOOP powder Take 17 g by mouth daily as needed for mild constipation.   Yes [provider]  Simethicone (GAS-X PO) Take 1 tablet by mouth daily as needed (stomach/gas).   Yes [provider]  UNABLE TO FIND 2 (two) times a week. Herbal Life Protein Powder: Mix into a shake and drink once a day   Yes [provider]  Lacosamide (VIMPAT) 100 MG TABS Take 1 tablet (100 mg total) by mouth 2 (two) times daily. Patient not taking: Reported on 05/25/2016 04/06/16   Varney Biles, MD    Allergies    No known allergies  Review of Systems   Review of Systems  Constitutional: Negative for chills, diaphoresis, fever and unexpected weight change.  Respiratory: Negative for shortness of breath.   Cardiovascular: Positive for chest pain.  Gastrointestinal: Positive for abdominal pain, nausea and vomiting. Negative for blood in stool, constipation and diarrhea.  Genitourinary: Negative for dysuria and frequency.  Musculoskeletal: Positive for back pain.  Skin: Negative for rash and wound.  Allergic/Immunologic: Positive for immunocompromised state.  Neurological: Negative for weakness and numbness.  Psychiatric/Behavioral: Negative for confusion.  All other systems reviewed and are negative.   Physical Exam Updated Vital Signs BP (!) 156/80   Pulse 72   Temp 98.1 F (36.7 C) (Oral)   Resp 15   SpO2 96%   Physical Exam Vitals and nursing note reviewed.  Constitutional:       General: He is not in acute distress.    Appearance: He is well-developed. He is obese. He is not diaphoretic.  HENT:     Head: Normocephalic and atraumatic.  Cardiovascular:     Rate and Rhythm: Normal rate and regular rhythm.     Heart sounds: Normal heart sounds.  Pulmonary:     Effort: Pulmonary effort is normal.     Breath sounds: Normal breath sounds.  Chest:     Chest wall: No tenderness.  Abdominal:     Palpations: Abdomen is soft.     Tenderness: There is no abdominal tenderness.     Hernia: A hernia is present. Hernia is present in the umbilical area.  Musculoskeletal:     Cervical back: Neck  supple.     Right lower leg: No edema.     Left lower leg: No edema.  Skin:    General: Skin is warm and dry.  Neurological:     Mental Status: He is alert and oriented to person, place, and time.  Psychiatric:        Behavior: Behavior normal.     ED Results / Procedures / Treatments   Labs (all labs ordered are listed, but only abnormal results are displayed) Labs Reviewed  BASIC METABOLIC PANEL - Abnormal; Notable for the following components:      Result Value   Chloride 97 (*)    Glucose, Bld 227 (*)    Creatinine, Ser 1.34 (*)    GFR, Estimated 60 (*)    All other components within normal limits  CBC - Abnormal; Notable for the following components:   WBC 13.3 (*)    Hemoglobin 12.7 (*)    HCT 37.2 (*)    MCV 74.8 (*)    MCH 25.6 (*)    All other components within normal limits  HEPATIC FUNCTION PANEL - Abnormal; Notable for the following components:   Albumin 3.0 (*)    AST 13 (*)    All other components within normal limits  CBC WITH DIFFERENTIAL/PLATELET - Abnormal; Notable for the following components:   WBC 13.2 (*)    Hemoglobin 12.6 (*)    HCT 37.3 (*)    MCV 74.9 (*)    MCH 25.3 (*)    Neutro Abs 9.0 (*)    All other components within normal limits  RESP PANEL BY RT-PCR (FLU A&B, COVID) ARPGX2  TROPONIN I (HIGH SENSITIVITY)  TROPONIN I  (HIGH SENSITIVITY)    EKG EKG Interpretation  Date/Time:  Thursday September 04 2020 06:55:10 EDT Ventricular Rate:  98 PR Interval:  164 QRS Duration: 74 QT Interval:  356 QTC Calculation: 454 R Axis:   104 Text Interpretation: Normal sinus rhythm Rightward axis Cannot rule out Anterior infarct , age undetermined Abnormal ECG No change from previous No change from previous Confirmed by Lavenia Atlas [phone]) on 09/04/2020 11:25:55 AM   Radiology DG Chest 2 View  Result Date: 09/04/2020 CLINICAL DATA:  Chest pain. EXAM: CHEST - 2 VIEW COMPARISON:  04/01/2017. FINDINGS: Mediastinum and hilar structures normal. Heart size normal. No focal infiltrate. No pleural effusion or pneumothorax. No acute bony abnormality. IMPRESSION: No acute cardiopulmonary disease. Electronically Signed   By: Marcello Moores  Register   On: 09/04/2020 07:13   CT Abdomen Pelvis W Contrast  Result Date: 09/04/2020 CLINICAL DATA:  65 year old male with abdominal pain and weakness. EXAM: CT ABDOMEN AND PELVIS WITH CONTRAST TECHNIQUE: Multidetector CT imaging of the abdomen and pelvis was performed using the standard protocol following bolus administration of intravenous contrast. CONTRAST:  190mL OMNIPAQUE IOHEXOL 300 MG/ML  SOLN COMPARISON:  CT Abdomen and Pelvis 06/07/2009. FINDINGS: Lower chest: Negative; stable tiny calcified granuloma in the left lower lobe on series 5, image 2. Hepatobiliary: Liver enhancement is within normal limits. Negative gallbladder. No bile duct enlargement. Pancreas: Negative. Spleen: Negative. Adrenals/Urinary Tract: Normal adrenal glands. Kidneys appear little changed since 2011 and within normal limits. A simple fluid density left renal midpole cyst has mildly enlarged since that time. Smaller similar appearing right renal midpole cyst suspected. Symmetric renal enhancement and contrast excretion. No nephrolithiasis. Negative ureters and bladder. Stomach/Bowel: Possible large bowel peristalsis in the  mid transverse colon on series 3, image 40. Although there are small but conspicuous regional  lymph nodes which are increased from 2011 (coronal image 39. The transverse colon segment and question is about 3.7 cm in length. No dilated upstream large bowel. Negative colon otherwise. Normal retrocecal appendix on series 3, image 57. Decompressed and negative terminal ileum. No dilated small bowel. Decompressed and negative stomach, duodenum. No free air, free fluid. Vascular/Lymphatic: Aortoiliac calcified atherosclerosis. Major arterial structures in the abdomen and pelvis are patent. Normal caliber abdominal aorta. Patent portal venous system. Reproductive: Stable small fat containing left inguinal hernia. Other: No pelvic free fluid. There is mild pelvic lipomatosis (series 3, image 84). Musculoskeletal: No acute osseous abnormality identified. 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 IMPRESSION: 1. Cannot exclude a 3.7 cm length apple-core type Mass/Carcinoma of the mid transverse colon, although artifact due to large bowel peristalsis is possible. Regional lymph nodes are small but increased since 2011. Recommend large bowel Endoscopy. 2. Otherwise stable, negative CT Abdomen and Pelvis. Aortic Atherosclerosis (ICD10-I70.0). Electronically Signed   By: Genevie Ann M.D.   On: 09/04/2020 08:51    Procedures Procedures   Medications Ordered in ED Medications  sodium chloride 0.9 % bolus 500 mL (0 mLs Intravenous Stopped 09/04/20 1016)  iohexol (OMNIPAQUE) 300 MG/ML solution 100 mL (100 mLs Intravenous Contrast Given 09/04/20 7017)    ED Course  I have reviewed the triage vital signs and the nursing notes.  Pertinent labs & imaging results that were available during my care of the patient were reviewed by me and considered in my medical decision making (see chart for details).  Clinical Course as of 09/04/20 1426  Thu Sep 05, 1679   633 65 year old male with complaint of abdominal, chest, back pain x 1 month. Patient reports pain worse with eating, somewhat improves with antacid and miralax. No pain at this time. On exam, patient is obese, has a small easily reduced and non tender umbilical hernia. No abdominal tenderness. Labs with mild leukocytosis of 13.2, no infectious source found, denies fevers. CMP with slightly worsening Cr compared to labs from 2018. Troponin x 2 flat.  CXR and EKG without acute findings. CT with concern for apple core lesion mid transverse colon vs area of peristalsis.  Patient reports UTD with colonoscopy with unknown GI group, I can verify a colonoscopy in 2008 but unable to see who cared for him at that time. Consult paged to on-call GI to discuss follow up.  [LM]  1304 Discussed with Dr. Glyn Ade with [facility], patient has been seen by [facility] in the past, Sadie Haber has been asked to consult on this patient.  [LM]  1347 Patient was dismissed from [facility], [place] to consult, requests hospitalist admission, clear liquid diet. [LM]  1349 Updated patient with plan of care, understanding verbalized, COVID swab ordered for admission. [LM]  1426 Discussed with internal medicine service who will consult for admission.  [LM]    Clinical Course User Index [LM] Roque Lias   MDM Rules/Calculators/A&P                          Final Clinical Impression(s) / ED Diagnoses Final diagnoses:  Generalized abdominal pain  Colonic mass    Rx / DC Orders ED Discharge Orders    None       Tacy Learn, PA-C 09/04/20 1426    Horton, Alvin Critchley, DO 09/07/20 0825

## 2020-09-04 NOTE — H&P (Addendum)
[facility] Admission History and Physical Service Pager: [phone]  Patient name: Thomas [name] Medical record number: [id number] Date of birth: 07/12/1955 Age: 65 y.o. Gender: male  Primary Care Provider: Benito Mccreedy, MD Consultants: [facility] Code Status: FULL Preferred Emergency Contact: Wife  Chief Complaint: crampy abdominal pain radiating to back and chest x1 month  Assessment and Plan: Thomas KUMPF is a 65 y.o. male presenting with one-month history of crampy abdominal pain that wraps around his back and radiates up to chest. PMH is significant for epilepsy, T2DM, HTN, frontal meningioma s/p lobectomy, hx acute respiratory failure with hypoxia.  Abdominal pain/abnormal CT findings concerning for possible malignancy of the large bowel: Patient presents with one month of crampy abdominal pain that radiates to low back and chest. Has been evaluated by his PCP, who recommended Gas-X and miralax, both of which provided moderate relief. Patient concerned that pain returns after some time, though. Worse with food, better with Miralax. Some intentional weight loss, but unintentional decreased appetite. No hematochezia, melena, hemoptysis, or bright red blood per rectum. Vital signs stable on admission, afebrile, SpO2 >95% on room air, intermittent hypertensive measurements with systolics in [id number].  Labs significant for mild creatinine bump to 1.34, mild anemia of 12.7 with a baseline seems to be in the normal range.  Mild leukocytosis 13.  T, ALT, alk phos within normal limits.  Bilirubin within normal limits.  CT abdomen pelvis concerning for 3.7 cm length apple-core type mass of the mid transverse colon, although artifact due to large bowel peristalsis is possible.  Patient did endorse a combination of diarrhea's, normal stools, and very thin stools when asked.  His last colonoscopy was in 2008, he recalls it being normal. GI consulted, plan for EGD and  colonoscopy tomorrow.  -Admit to [facility], attending Dr. [name] -GI consultation, appreciate recommendations -Clear liquid diet -N.p.o. at midnight -Protonix 40 mg daily -Heparin subcu given planned procedure and possibility for biopsy -plan for EGD and colonoscopy tomorrow -PT/OT eval and treat -AM labs  Hypertension:  Blood pressure since admission overall normotensive, has ranged from 124-157/69-86.  Home medications include Norvasc 10 mg daily, hydrochlorothiazide 25 mg daily, lisinopril 40 mg daily. -Continue home medications  History of epilepsy: Home medications include Keppra 1000 mg twice daily. Last grand mal seizure one year ago. Reports lots of lost time episodes, staring off into space.  -Continue home Keppra  Type 2 diabetes: Last A1c was 8.6 on 08/26/2016.  Home medications include metformin 500 mg twice daily - moderate sliding scale insulin  - CBGs QID AC & qHS  Peripheral neuropathy: Home medications include gabapentin 600 mg 3 times per day PRN. -Continue home gabapentin  GERD: Home medications include Prilosec 20 mg daily - prilosec 40 mg daily per formulary while inpatient -GI recommendations as above  Peripheral edema: Patient home medications include Lasix 40 mg daily.  No recent echo but in 2012 he had an ejection fraction of 55% per chart review.  Patient's home blood pressure medicines include amlodipine 10 mg daily which may be the cause of his peripheral edema.  We will hold Lasix at this time and can consider restarting versus PCP follow-up to determine if he needs to continue this medication. -Holding home Lasix  FEN/GI: Clear liquid Prophylaxis: heparin subcu  Disposition: med surg  History of Present Illness:  Thomas SANSOUCIE is a 65 y.o. male presenting with crampy abdominal pain that wraps around to his back for the past month.  Mr. Burks reports epigastric abdominal pain that radiates to his lower back and all across his chest. He reports  some changes in appetite, and reports that the pain is worse with eating, but was able to eat a full meal on Easter Sunday. Sometime ate less in order to avoid the pain. He reports that has gotten some pain relief with climbing up stairs.  He reports early satiety, but denies unintentional weight loss. He does report some nausea and vomiting this past month maybe 4-5 times, which he states has varied from "watery" or consisting of undigested food. He does report constipation, and has gone 2-3 days without BMs, and found this "to be very alarming", as he is on metformin and normally has BM every day. He reports that stools range from "soft to watery consistency"  He denies any melena and hematochezia.   He reports coming to the ED today because he worried that he missed a few doctor's appointments because the doctor only has his landline and not his cell phone, saying "that a lot of appointments made for me were overlooked". Says his abdominal pain was 12/10.  He also reports being worried that he has extensive family history of cancer.  He reports intentional weight loss of 12 pounds. He does report some night sweats, but has had those since leaving the [employer]. He does report a decrease in energy, has become "more slothful". He does report daily headaches since craniotomy in 2013, and some stress headaches that are different than usual. Some vision changes, but slow over time, nothing acute within the last couple of months. No changes in taste or smell.   Currently seeing therapist, that has helped with stress headaches. Tries to regulate stress by getting time to himself.   Review Of Systems: Per HPI with the following additions:  Review of Systems  Constitutional: Positive for activity change, appetite change and fatigue. Negative for fever and unexpected weight change.  Cardiovascular: Positive for chest pain.  Gastrointestinal: Positive for abdominal pain and diarrhea. Negative for blood in stool  and constipation.  Genitourinary: Negative for dysuria and flank pain.  Musculoskeletal: Positive for back pain.  Neurological: Positive for headaches.     Patient Active Problem List   Diagnosis Date Noted  . Abdominal pain 09/04/2020  . S/P craniotomy 09/01/2016  . Meningioma ([place]) 09/01/2016  . Trauma   . Diabetes mellitus type II, controlled ([place])   . Essential hypertension   . Aspiration pneumonia ([place])   . Acute encephalopathy 04/09/2014  . Acute ischemic left MCA stroke ([place]) 04/09/2014  . Acute respiratory failure with hypoxia ([place]) 04/09/2014  . Lactic acidosis 04/09/2014  . Hyperammonemia ([place]) 04/09/2014    Past Medical History: Past Medical History:  Diagnosis Date  . Asthma    as a child  . Brain tumor ([place])    frontal  . Confusion    occasionally  . Diabetes mellitus without complication ([place])    takes Metformin daily.  . Dizziness   . Dyspnea    pt states d/t weight  . Epilepsy ([place])    takes Keppra daily  . GERD (gastroesophageal reflux disease)    takes Omeprazole daily  . Headache   . Hyperlipidemia    takes Atorvastatin daily  . Hypertension    takes Lotrel daily  . Peripheral edema    takes Lasix daily  . Peripheral neuropathy    takes Gabapentin daily  . Pneumonia 3 yrs ago   hx of  . Seizures (  [place])   . Sleep apnea   . Urinary frequency     Past Surgical History: Past Surgical History:  Procedure Laterality Date  . CARDIAC CATHETERIZATION  7 yrs ago  . CRANIOTOMY N/A 09/01/2016   Procedure: CRANIOTOMY TUMOR  LEFT PTERIONAL;  Surgeon: Ashok Pall, MD;  Location: [facility];  Service: Neurosurgery;  Laterality: N/A;  . cyst removed from chest      as a child  . NO PAST SURGERIES      Social History: Social History   Tobacco Use  . Smoking status: Former Research scientist (life sciences)  . Smokeless tobacco: Never Used  . Tobacco comment: quit smoking 35 yrs ago  Substance Use Topics  . Alcohol use: No  . Drug use: No   Additional social history: -  Former smoker (8 years total, smoked 1/2 PPD) - No ETOH since 65 years of age - No illicit drugs - No prescription drug abuse Please also refer to relevant sections of EMR.  Family History: Family History  Family history unknown: Yes   CHF: mother DM: brother, sister, uncle "Stomach pains and digestion issues": cousins, relatives, all across family (no known formal dx) Breast cancer: mother, four maternal aunts Colon cancer: uncle Unknown type of cancer: Uncle  Allergies and Medications: Allergies  Allergen Reactions  . No Known Allergies    No current facility-administered medications on file prior to encounter.   Current Outpatient Medications on File Prior to Encounter  Medication Sig Dispense Refill  . acetaminophen (TYLENOL) 500 MG tablet Take 1,000 mg by mouth every 6 (six) hours as needed for headache (pain).     Marland Kitchen albuterol (PROVENTIL HFA;VENTOLIN HFA) 108 (90 BASE) MCG/ACT inhaler Inhale 2 puffs into the lungs every 4 (four) hours as needed for wheezing or shortness of breath. 1 Inhaler 1  . amLODipine (NORVASC) 10 MG tablet Take 10 mg by mouth daily.    Marland Kitchen atorvastatin (LIPITOR) 20 MG tablet Take 20 mg by mouth daily at 6 PM.     . calcium carbonate (TUMS - DOSED IN MG ELEMENTAL CALCIUM) 500 MG chewable tablet Chew 1-2 tablets by mouth 2 (two) times daily as needed for indigestion or heartburn (DEPENDS ON INDIGESTION IF TAKES 1-2 TABLETS).    . Chlorpheniramine-Acetaminophen (CORICIDIN HBP COLD/FLU PO) Take 2 tablets by mouth 2 (two) times daily as needed (COLD SYMPTOMS).    . furosemide (LASIX) 20 MG tablet Take 2 tablets (40 mg total) by mouth daily. 60 tablet 3  . gabapentin (NEURONTIN) 300 MG capsule Take 1 capsule (300 mg total) by mouth 3 (three) times daily. (Patient taking differently: Take 600 mg by mouth 3 (three) times daily.) 90 capsule 1  . hydrochlorothiazide (HYDRODIURIL) 25 MG tablet Take 25 mg by mouth daily.    Marland Kitchen levETIRAcetam (KEPPRA) 1000 MG tablet Take  1,000 mg by mouth 2 (two) times daily.  0  . lisinopril (ZESTRIL) 40 MG tablet Take 40 mg by mouth daily.    . metFORMIN (GLUCOPHAGE-XR) 500 MG 24 hr tablet Take 500 mg by mouth 2 (two) times daily.    Marland Kitchen omeprazole (PRILOSEC) 20 MG capsule Take 20 mg by mouth daily.  0  . polyethylene glycol powder (GLYCOLAX/MIRALAX) 17 GM/SCOOP powder Take 17 g by mouth daily as needed for mild constipation.    . Simethicone (GAS-X PO) Take 1 tablet by mouth daily as needed (stomach/gas).    Marland Kitchen UNABLE TO FIND 2 (two) times a week. Herbal Life Protein Powder: Mix into a shake and drink  once a day    . Lacosamide (VIMPAT) 100 MG TABS Take 1 tablet (100 mg total) by mouth 2 (two) times daily. (Patient not taking: Reported on 05/25/2016) 60 tablet 2    Objective: BP (!) 148/78   Pulse 84   Temp 98.1 F (36.7 C) (Oral)   Resp 15   SpO2 97%  Exam: General: awake, alert, oriented, NAD, conversant  Eyes: EOM intact Cardiovascular: RRR no murmur, DP 2+ bilaterally  Respiratory: CTAB Gastrointestinal: obese abdomen, BS present, diffuse discomfort to palpation with no acute surgical findings. Fullness in right upper quadrant. Tympanic to percussion.   MSK: moving all extremities spontaneously Neuro: No focal deficits Psych: mood and affect congruent   Labs and Imaging: CBC BMET  Recent Labs  Lab 09/04/20 0659  WBC 13.2*  13.3*  HGB 12.6*  12.7*  HCT 37.3*  37.2*  PLT 381  368   Recent Labs  Lab 09/04/20 0659  NA 135  K 4.7  CL 97*  CO2 29  BUN 16  CREATININE 1.34*  GLUCOSE 227*  CALCIUM 9.4     EKG: NSR, 98 bpm, QTc 454, no ST segment elevation  CHEST - 2 VIEW 09/04/2020 FINDINGS: Mediastinum and hilar structures normal. Heart size normal. No focal infiltrate. No pleural effusion or pneumothorax. No acute bony abnormality. IMPRESSION: No acute cardiopulmonary disease.  CT ABDOMEN AND PELVIS WITH CONTRAST 09/04/2020 IMPRESSION: 1. Cannot exclude a 3.7 cm length apple-core type  Mass/Carcinoma of the mid transverse colon, although artifact due to large bowel peristalsis is possible. Regional lymph nodes are small but increased since 2011. Recommend large bowel Endoscopy. 2. Otherwise stable, negative CT Abdomen and Pelvis. Aortic Atherosclerosis (ICD10-I70.0).  Jacklynn Bue, Medical Student 09/04/2020, 4:41 PM  I was personally present and performed or re-performed the history, physical exam and medical decision making activities of this service and have verified that the service and findings are accurately documented in the student's note.  Ezequiel Essex, MD                  09/04/2020, 6:09 PM PGY-1, [facility] Intern pager: [phone], text pages welcome  Upper Level Addendum:  I have seen and evaluated this patient along with Dr. Jeani Hawking and student Dr. Kenton Kingfisher and reviewed the above note, making necessary revisions as appropriate.  I agree with the medical decision making and physical exam as noted above.  Lurline Del, DO PGY-2  [facility]

## 2020-09-04 NOTE — ED Notes (Signed)
Pt returns from ct scan. Placed back on tele, no needs from pt at this time.

## 2020-09-04 NOTE — Hospital Course (Addendum)
Thomas [name] is a 65 y.o. male with a history of epilepsy, T2DM, HTN, frontal meningioma s/p lobectomy who presented with abdominal pain found to have adenocarcinoma now s/p right hemicolectomy. Hospital course outlined by problem below:  Obstructive transverse colon mass Invasive Adenocarcinoma Patient presented with abdominal pain, early satiety and change in bowel movements/ stool quality. CT abdomen/pelvis concerning for 3.7 cm apple-core type mass of the mid transverse colon.  Colonoscopy performed on 4/29 with finding of malignant completely obstructing tumor in the transverse colon.  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 Rectal polyp was also excised and sent for pathology and revealed Tubular adenoma and no high grade dysplasia or malignancy. He underwent right hemicolectomy with end colostomy on 5/4 with Gen. Surgery. Surgical pathology notable for adenocarcinoma, well differentiated and metastatic carcinoma involving one of nineteen lymph nodes (1/19). He was tolerating soft diet at discharge. Did not require opioid medication for more than 24 hours prior to d/c, thus only d/c with instructions to take OTC Tylenol for pain. He received ostomy care education. He will need outpatient follow up with Gen. Surgery and Oncology.  HTN Home medications: amlodipine 10 mg daily, HCTZ 25 mg daily, lisinopril 40 mg daily. BP medications were started on admission and then held due to NPO status for surgery. Bps remained stable. Lisinopril 20mg  was started on 5/9. He was started back on his home medications at discharge. He will need continued outpatient follow up and management.    T2DM with peripheral neuropathy Home medications include metformin 500 mg twice daily, Gabapentin 300mg   and Atorvastatin 20mg  and was held at admission. He received Lantus 5 units during admission while NPO and advancing diet. He was restarted on PO medications at discharge. He will need continued  outpatient follow up and management.   Peripheral edema His home medication of Furosemide was held during admission. He did not have any peripheral edema on PE during admission.   Epilepsy He continued on home dose of Keppra 1000mg  IV or PO during admission and remained stable.   GERD Received Protonix 40mg  in either IV or PO formulation during admission.    Holosystolic murmur Noted on exam on admission, patient states this has been known for 1 month but no work-up has been done so far. Consider outpatient workup  Follow up:  Right upper lobe lung nodule recommended for surveillance.  Gen Surgery follow up  Oncology outpatient follow up  Home HCTZ was held. Recheck BP, adjust medications as needed.   Other problems chronic and stable.

## 2020-09-04 NOTE — Plan of Care (Signed)

## 2020-09-04 NOTE — Consult Note (Signed)
[facility]  We were asked to consult on this patient in ED but they are a patient of [facility]. (The note in the chart from [facility] is just a no show note, he was never seen there). I have consulted [facility] and they will see the patient.  Ellouise Newer, PA-C

## 2020-09-04 NOTE — ED Notes (Signed)
Spoke to ED Pharm. They are brining the peg 350 powder kit over

## 2020-09-04 NOTE — ED Notes (Signed)
Patient transported to CT 

## 2020-09-04 NOTE — ED Notes (Signed)
Tried to call report. Michela Pitcher will call back in 5 minutes

## 2020-09-04 NOTE — ED Triage Notes (Signed)
Pt reports chest and abdominal pain X2 weeks however worse the past two days.  Has been seeing his PCP however no relief.

## 2020-09-05 ENCOUNTER — Encounter (HOSPITAL_COMMUNITY)
Admission: EM | Disposition: A | Discharge status: Home Health | Payer: Self-pay | Source: Home / Self Care | Attending: Family Medicine

## 2020-09-05 ENCOUNTER — Other Ambulatory Visit: Payer: Self-pay

## 2020-09-05 ENCOUNTER — Observation Stay (HOSPITAL_COMMUNITY): Payer: Medicare HMO | Admitting: Certified Registered Nurse Anesthetist

## 2020-09-05 ENCOUNTER — Encounter (HOSPITAL_COMMUNITY): Payer: Self-pay | Admitting: Family Medicine

## 2020-09-05 DIAGNOSIS — C184 Malignant neoplasm of transverse colon: Secondary | ICD-10-CM | POA: Diagnosis present

## 2020-09-05 DIAGNOSIS — Z7984 Long term (current) use of oral hypoglycemic drugs: Secondary | ICD-10-CM | POA: Diagnosis not present

## 2020-09-05 DIAGNOSIS — Z6841 Body Mass Index (BMI) 40.0 and over, adult: Secondary | ICD-10-CM | POA: Diagnosis not present

## 2020-09-05 DIAGNOSIS — K6389 Other specified diseases of intestine: Secondary | ICD-10-CM | POA: Diagnosis not present

## 2020-09-05 DIAGNOSIS — K5900 Constipation, unspecified: Secondary | ICD-10-CM | POA: Diagnosis present

## 2020-09-05 DIAGNOSIS — D123 Benign neoplasm of transverse colon: Secondary | ICD-10-CM | POA: Diagnosis not present

## 2020-09-05 DIAGNOSIS — N179 Acute kidney failure, unspecified: Secondary | ICD-10-CM | POA: Diagnosis not present

## 2020-09-05 DIAGNOSIS — I10 Essential (primary) hypertension: Secondary | ICD-10-CM | POA: Diagnosis present

## 2020-09-05 DIAGNOSIS — K5730 Diverticulosis of large intestine without perforation or abscess without bleeding: Secondary | ICD-10-CM | POA: Diagnosis present

## 2020-09-05 DIAGNOSIS — R1084 Generalized abdominal pain: Secondary | ICD-10-CM | POA: Diagnosis present

## 2020-09-05 DIAGNOSIS — Z86011 Personal history of benign neoplasm of the brain: Secondary | ICD-10-CM | POA: Diagnosis not present

## 2020-09-05 DIAGNOSIS — Z800 Family history of malignant neoplasm of digestive organs: Secondary | ICD-10-CM | POA: Diagnosis not present

## 2020-09-05 DIAGNOSIS — E860 Dehydration: Secondary | ICD-10-CM | POA: Diagnosis present

## 2020-09-05 DIAGNOSIS — E1142 Type 2 diabetes mellitus with diabetic polyneuropathy: Secondary | ICD-10-CM | POA: Diagnosis present

## 2020-09-05 DIAGNOSIS — Z79899 Other long term (current) drug therapy: Secondary | ICD-10-CM | POA: Diagnosis not present

## 2020-09-05 DIAGNOSIS — Z8673 Personal history of transient ischemic attack (TIA), and cerebral infarction without residual deficits: Secondary | ICD-10-CM | POA: Diagnosis not present

## 2020-09-05 DIAGNOSIS — K621 Rectal polyp: Secondary | ICD-10-CM

## 2020-09-05 DIAGNOSIS — K219 Gastro-esophageal reflux disease without esophagitis: Secondary | ICD-10-CM | POA: Diagnosis present

## 2020-09-05 DIAGNOSIS — Z20822 Contact with and (suspected) exposure to covid-19: Secondary | ICD-10-CM | POA: Diagnosis present

## 2020-09-05 DIAGNOSIS — Z933 Colostomy status: Secondary | ICD-10-CM | POA: Diagnosis not present

## 2020-09-05 DIAGNOSIS — Z833 Family history of diabetes mellitus: Secondary | ICD-10-CM | POA: Diagnosis not present

## 2020-09-05 DIAGNOSIS — K420 Umbilical hernia with obstruction, without gangrene: Secondary | ICD-10-CM | POA: Diagnosis present

## 2020-09-05 DIAGNOSIS — E785 Hyperlipidemia, unspecified: Secondary | ICD-10-CM | POA: Diagnosis present

## 2020-09-05 DIAGNOSIS — Z87891 Personal history of nicotine dependence: Secondary | ICD-10-CM | POA: Diagnosis not present

## 2020-09-05 DIAGNOSIS — E118 Type 2 diabetes mellitus with unspecified complications: Secondary | ICD-10-CM | POA: Diagnosis not present

## 2020-09-05 DIAGNOSIS — C772 Secondary and unspecified malignant neoplasm of intra-abdominal lymph nodes: Secondary | ICD-10-CM | POA: Diagnosis present

## 2020-09-05 HISTORY — PX: BIOPSY: SHX5522

## 2020-09-05 HISTORY — PX: POLYPECTOMY: SHX5525

## 2020-09-05 HISTORY — PX: SUBMUCOSAL TATTOO INJECTION: SHX6856

## 2020-09-05 HISTORY — PX: COLONOSCOPY WITH PROPOFOL: SHX5780

## 2020-09-05 LAB — COMPREHENSIVE METABOLIC PANEL
ALT: 21 U/L (ref 0–44)
AST: 14 U/L — ABNORMAL LOW (ref 15–41)
Albumin: 3.3 g/dL — ABNORMAL LOW (ref 3.5–5.0)
Alkaline Phosphatase: 82 U/L (ref 38–126)
Anion gap: 9 (ref 5–15)
BUN: 13 mg/dL (ref 8–23)
CO2: 28 mmol/L (ref 22–32)
Calcium: 9.2 mg/dL (ref 8.9–10.3)
Chloride: 99 mmol/L (ref 98–111)
Creatinine, Ser: 1.17 mg/dL (ref 0.61–1.24)
GFR, Estimated: >60 mL/min (ref >60)
Glucose, Bld: 178 mg/dL — ABNORMAL HIGH (ref 70–99)
Potassium: 4 mmol/L (ref 3.5–5.1)
Sodium: 136 mmol/L (ref 135–145)
Total Bilirubin: 0.8 mg/dL (ref 0.3–1.2)
Total Protein: 7.4 g/dL (ref 6.5–8.1)

## 2020-09-05 LAB — CBC
HCT: 35.6 % — ABNORMAL LOW (ref 39.0–52.0)
Hemoglobin: 11.9 g/dL — ABNORMAL LOW (ref 13.0–17.0)
MCH: 24.9 pg — ABNORMAL LOW (ref 26.0–34.0)
MCHC: 33.4 g/dL (ref 30.0–36.0)
MCV: 74.5 fL — ABNORMAL LOW (ref 80.0–100.0)
Platelets: 350 10*3/uL (ref 150–400)
RBC: 4.78 MIL/uL (ref 4.22–5.81)
RDW: 14.9 % (ref 11.5–15.5)
WBC: 12.3 10*3/uL — ABNORMAL HIGH (ref 4.0–10.5)
nRBC: 0 % (ref 0.0–0.2)

## 2020-09-05 LAB — GLUCOSE, CAPILLARY
Glucose-Capillary: 190 mg/dL — ABNORMAL HIGH (ref 70–99)
Glucose-Capillary: 156 mg/dL — ABNORMAL HIGH (ref 70–99)
Glucose-Capillary: 195 mg/dL — ABNORMAL HIGH (ref 70–99)
Glucose-Capillary: 239 mg/dL — ABNORMAL HIGH (ref 70–99)

## 2020-09-05 SURGERY — COLONOSCOPY WITH PROPOFOL
Anesthesia: Monitor Anesthesia Care

## 2020-09-05 MED ORDER — BOOST / RESOURCE BREEZE PO LIQD CUSTOM
1.0000 | Freq: Three times a day (TID) | ORAL | Status: DC
  Administered 2020-09-05 – 2020-09-09 (×11): 1 via ORAL

## 2020-09-05 MED ORDER — ADULT MULTIVITAMIN W/MINERALS CH
1.0000 | ORAL_TABLET | Freq: Every day | ORAL | Status: DC
  Administered 2020-09-05 – 2020-09-10 (×6): 1 via ORAL
  Filled 2020-09-05 (×6): qty 1

## 2020-09-05 MED ORDER — PROPOFOL 10 MG/ML IV BOLUS
INTRAVENOUS | Status: DC | PRN
  Administered 2020-09-05: 10 mg via INTRAVENOUS
  Administered 2020-09-05: 20 mg via INTRAVENOUS
  Administered 2020-09-05: 10 mg via INTRAVENOUS

## 2020-09-05 MED ORDER — LACTATED RINGERS IV SOLN: INTRAVENOUS | Status: DC | PRN

## 2020-09-05 MED ORDER — ONDANSETRON HCL 4 MG/2ML IJ SOLN
INTRAMUSCULAR | Status: AC
  Filled 2020-09-05: qty 2

## 2020-09-05 MED ORDER — PHENOL 1.4 % MT LIQD
1.0000 | OROMUCOSAL | Status: DC | PRN
  Administered 2020-09-05: 1 via OROMUCOSAL
  Filled 2020-09-05: qty 177

## 2020-09-05 MED ORDER — BOOST / RESOURCE BREEZE PO LIQD CUSTOM: 1.0000 | Freq: Two times a day (BID) | ORAL | Status: DC

## 2020-09-05 MED ORDER — PROSOURCE PLUS PO LIQD
30.0000 mL | Freq: Two times a day (BID) | ORAL | Status: DC
  Administered 2020-09-05 – 2020-09-09 (×9): 30 mL via ORAL
  Filled 2020-09-05 (×10): qty 30

## 2020-09-05 MED ORDER — PROPOFOL 500 MG/50ML IV EMUL
INTRAVENOUS | Status: DC | PRN
  Administered 2020-09-05: 125 ug/kg/min via INTRAVENOUS

## 2020-09-05 MED ORDER — SPOT INK MARKER SYRINGE KIT
PACK | SUBMUCOSAL | Status: AC
  Filled 2020-09-05: qty 10

## 2020-09-05 MED ORDER — ONDANSETRON HCL 4 MG/2ML IJ SOLN
INTRAMUSCULAR | Status: DC | PRN
  Administered 2020-09-05: 4 mg via INTRAVENOUS

## 2020-09-05 MED ORDER — SPOT INK MARKER SYRINGE KIT
PACK | SUBMUCOSAL | Status: DC | PRN
  Administered 2020-09-05: 10 mL via SUBMUCOSAL

## 2020-09-05 SURGICAL SUPPLY — 22 items

## 2020-09-05 NOTE — Plan of Care (Signed)
  Problem: Health Behavior/Discharge Planning: Goal: Ability to manage health-related needs will improve Outcome: Progressing   Problem: Clinical Measurements: Goal: Diagnostic test results will improve Outcome: Progressing Goal: Cardiovascular complication will be avoided Outcome: Progressing   Problem: Coping: Goal: Level of anxiety will decrease Outcome: Progressing

## 2020-09-05 NOTE — Progress Notes (Signed)
OT Cancellation Note  Patient Details Name: Thomas [name] MRN: [id number] DOB: Jul 18, 1955   Cancelled Treatment:    Reason Eval/Treat Not Completed: Patient at procedure or test/ unavailable. Pt off unit for colonoscopy. OT will follow up next available time as appropriate  Britt Bottom 09/05/2020, 10:24 AM

## 2020-09-05 NOTE — Interval H&P Note (Signed)
History and Physical Interval Note:  09/05/2020 9:36 AM  [name]  has presented today for surgery, with the diagnosis of Abnormal Abdominal CT.  The various methods of treatment have been discussed with the patient and family. After consideration of risks, benefits and other options for treatment, the patient has consented to  Procedure(s): COLONOSCOPY WITH PROPOFOL (N/A) as a surgical intervention.  The patient's history has been reviewed, patient examined, no change in status, stable for surgery.  I have reviewed the patient's chart and labs.  Questions were answered to the patient's satisfaction.     Thomas [name]

## 2020-09-05 NOTE — Evaluation (Signed)
Physical Therapy Evaluation & Discharge Patient Details Name: Thomas [name] MRN: [id number] DOB: 1955/11/27 Today's Date: 09/05/2020   History of Present Illness  65 y/o male presented to ED on 4/28 with complaints of chest pain and abdominal pain. CT concering for possible malignancy of large bowel. 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 PMH: epilepsy, Type II DM, HTN, frontal meningioma s/p lobectomy, hx of acute respiratory failure with hypoxia.  Clinical Impression  PTA, patient lives with wife, son, and daughter in law and independent with mobility. Patient does not drive or work since tumor resection in 2017. Patient reports mild cognitive deficits since resection in 2017. Patient overall functioning at independent level and patient reports feeling at baseline. No apparent balance deficits noted. No further skilled PT needs required acutely. No PT follow up recommended at this time. PT will sign off.     Follow Up Recommendations No PT follow up    Equipment Recommendations  None recommended by PT    Recommendations for Other Services       Precautions / Restrictions Precautions Precautions: Fall Restrictions Weight Bearing Restrictions: No      Mobility  Bed Mobility Overal bed mobility: Independent                  Transfers Overall transfer level: Independent                  Ambulation/Gait Ambulation/Gait assistance: Independent Gait Distance (Feet): 300 Feet Assistive device: None Gait Pattern/deviations: WFL(Within Functional Limits)   Gait velocity interpretation: >4.37 ft/sec, indicative of normal walking speed    Stairs            Wheelchair Mobility    Modified Rankin (Stroke Patients Only)       Balance Overall balance assessment: No apparent balance deficits (not formally assessed)                                           Pertinent Vitals/Pain  Pain Assessment: No/denies pain    Home Living Family/patient expects to be discharged to:: Private residence Living Arrangements: Spouse/significant other;Children Available Help at Discharge: Family;Available PRN/intermittently Type of Home: House Home Access: Stairs to enter Entrance Stairs-Rails: Psychiatric nurse of Steps: 3 Home Layout: Two level (lives on main level) Home Equipment: Kasandra Knudsen - single point      Prior Function Level of Independence: Independent         Comments: independent with ADLs, does not drive since tumor resection in 2017     Hand Dominance        Extremity/Trunk Assessment   Upper Extremity Assessment Upper Extremity Assessment: Overall WFL for tasks assessed    Lower Extremity Assessment Lower Extremity Assessment: Overall WFL for tasks assessed    Cervical / Trunk Assessment Cervical / Trunk Assessment: Kyphotic  Communication   Communication: No difficulties  Cognition Arousal/Alertness: Awake/alert Behavior During Therapy: WFL for tasks assessed/performed Overall Cognitive Status: Within Functional Limits for tasks assessed                                 General Comments: reports mild cognitive deficits due to tumor resection in 2017      General Comments      Exercises     Assessment/Plan  PT Assessment Patent does not need any further PT services  PT Problem List         PT Treatment Interventions      PT Goals (Current goals can be found in the Care Plan section)  Acute Rehab PT Goals Patient Stated Goal: to go home PT Goal Formulation: With patient    Frequency     Barriers to discharge        Co-evaluation               AM-PAC PT "6 Clicks" Mobility  Outcome Measure Help needed turning from your back to your side while in a flat bed without using bedrails?: None Help needed moving from lying on your back to sitting on the side of a flat bed without using  bedrails?: None Help needed moving to and from a bed to a chair (including a wheelchair)?: None Help needed standing up from a chair using your arms (e.g., wheelchair or bedside chair)?: None Help needed to walk in hospital room?: None Help needed climbing 3-5 steps with a railing? : None 6 Click Score: 24    End of Session   Activity Tolerance: Patient tolerated treatment well Patient left: in bed;with call bell/phone within reach Nurse Communication: Mobility status PT Visit Diagnosis: Muscle weakness (generalized) (M62.81)    Time: 1521-1540 PT Time Calculation (min) (ACUTE ONLY): 19 min   Charges:   PT Evaluation $PT Eval Low Complexity: 1 Low          Jamarie Joplin A. Gilford Rile PT, DPT Acute Rehabilitation Services Pager [phone] Office [phone]   Linna Hoff 09/05/2020, 4:42 PM

## 2020-09-05 NOTE — Progress Notes (Signed)
Initial Nutrition Assessment  DOCUMENTATION CODES:   Morbid obesity  INTERVENTION:   -Boost Breeze po TID, each supplement provides 250 kcal and 9 grams of protein -30 ml Prosource Plus BID, each supplement provides 100 kcals and 15 grams protein -MVI with minerals daily -RD will follow for diet advancement and adjust supplement regimen as appropriate  NUTRITION DIAGNOSIS:   Inadequate oral intake related to altered GI function as evidenced by NPO status.  GOAL:   Patient will meet greater than or equal to 90% of their needs  MONITOR:   PO intake,Supplement acceptance,Diet advancement,Labs,Weight trends,Skin,I & O's  REASON FOR ASSESSMENT:   Consult Assessment of nutrition requirement/status  ASSESSMENT:   Thomas [name] is a 65 y.o. male presenting with one-month history of crampy abdominal pain that wraps around his back and radiates up to chest. PMH is significant for epilepsy, T2DM, HTN, frontal meningioma s/p lobectomy, hx acute respiratory failure with hypoxia.  Pt admitted with abdominal pain and abnormal CT findings concerning for possible malignancy of large bowel.   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 Pt shares he is saddened by the news, but somewhat expected it due to strong family history of cancer.   PTA pt reports that he had a good appetite. He generally consumed 2 meals per day. He shares that he has altered his eating habits, as his wife was recent hospitalized with a heart attack and they recently moved in with his son, who is very health-concious. Pt reports he has increased intake of fruits and vegetables and started eating lentils. Breakfast consists of cereal and fruit and Dinner is a meat, starch, and  vegetable. Pt shares he still indulges on heavy foods, but usually saves these foods for holidays and gatherings.   Per pt, he estimates he has lost about 12 pounds intentionally due to lifestyle modifications.   Discussed rationale for NPO (noted pt was advanced to clear liquids after RD visit). Discussed importance of good meal and supplement intake to promote healing. Pt is interested in trying oral nutrition supplements.   Medications reviewed and include miralax.   Lab Results  Component Value Date   HGBA1C 8.6 (H) 08/26/2016   PTA DM medications are 500 mg metformin BID.   Labs reviewed: CBGS: 837-290 (inpatient orders for glycemic control are none).   NUTRITION - FOCUSED PHYSICAL EXAM:  Flowsheet Row Most Recent Value  Orbital Region No depletion  Upper Arm Region No depletion  Thoracic and Lumbar Region No depletion  Buccal Region No depletion  Temple Region No depletion  Clavicle Bone Region No depletion  Clavicle and Acromion Bone Region No depletion  Scapular Bone Region No depletion  Dorsal Hand No depletion  Patellar Region No depletion  Anterior Thigh Region No depletion  Posterior Calf Region No depletion  Edema (RD Assessment) None  Hair Reviewed  Eyes Reviewed  Mouth Reviewed  Skin Reviewed  Nails Reviewed       Diet Order:   Diet Order            Diet clear liquid Room service appropriate? Yes; Fluid consistency: Thin  Diet effective now                 EDUCATION NEEDS:  Education needs have been addressed  Skin:  Skin Assessment: Reviewed RN Assessment  Last BM:  09/04/20  Height:   Ht Readings from Last 1 Encounters:  09/04/20 5\' 8"  (1.727 m)    Weight:   Wt Readings from Last 1 Encounters:  04/01/17 (!) 141.5 kg    Ideal Body Weight:  70 kg  BMI:  Body mass index is 47.44 kg/m.  Estimated Nutritional Needs:   Kcal:  2100-2300  Protein:  105-120 grams  Fluid:  > 2 L    Loistine Chance, RD, LDN, [place] Registered  Dietitian II Certified Diabetes Care and Education Specialist Please refer to [facility] for RD and/or RD on-call/weekend/after hours pager

## 2020-09-05 NOTE — Anesthesia Preprocedure Evaluation (Signed)
Anesthesia Evaluation  Patient identified by MRN, date of birth, ID band  Reviewed: Allergy & Precautions, NPO status , Patient's Chart, lab work & pertinent test results  Airway Mallampati: III       Dental no notable dental hx. (+) Poor Dentition, Dental Advisory Given, Missing   Pulmonary former smoker,    Pulmonary exam normal breath sounds clear to auscultation       Cardiovascular hypertension, Pt. on medications Normal cardiovascular exam     Neuro/Psych    GI/Hepatic Neg liver ROS, GERD  Medicated and Controlled,  Endo/Other  diabetes, Type 2, Oral Hypoglycemic AgentsMorbid obesity  Renal/GU negative Renal ROS  negative genitourinary   Musculoskeletal negative musculoskeletal ROS (+)   Abdominal (+) + obese,   Peds  Hematology negative hematology ROS (+)   Anesthesia Other Findings   Reproductive/Obstetrics                             BP Readings from Last 3 Encounters:  09/05/20 (!) 147/77  04/01/17 127/67  09/04/16 123/67   Lab Results  Component Value Date   WBC 12.3 (H) 09/05/2020   HGB 11.9 (L) 09/05/2020   HCT 35.6 (L) 09/05/2020   MCV 74.5 (L) 09/05/2020   PLT 350 09/05/2020     Chemistry      Component Value Date/Time   NA 136 09/05/2020 0348   K 4.0 09/05/2020 0348   CL 99 09/05/2020 0348   CO2 28 09/05/2020 0348   BUN 13 09/05/2020 0348   CREATININE 1.17 09/05/2020 0348      Component Value Date/Time   CALCIUM 9.2 09/05/2020 0348   ALKPHOS 82 09/05/2020 0348   AST 14 (L) 09/05/2020 0348   ALT 21 09/05/2020 0348   BILITOT 0.8 09/05/2020 0348      Anesthesia Physical  Anesthesia Plan  ASA: III  Anesthesia Plan: MAC   Post-op Pain Management:    Induction: Intravenous  PONV Risk Score and Plan: 1 and Ondansetron  Airway Management Planned: Simple Face Mask  Additional Equipment:   Intra-op Plan:   Post-operative Plan:   Informed  Consent: I have reviewed the patients History and Physical, chart, labs and discussed the procedure including the risks, benefits and alternatives for the proposed anesthesia with the patient or authorized representative who has indicated his/her understanding and acceptance.       Plan Discussed with: CRNA and Surgeon  Anesthesia Plan Comments:         Anesthesia Quick Evaluation

## 2020-09-05 NOTE — Anesthesia Procedure Notes (Signed)
Procedure Name: MAC Date/Time: 09/05/2020 9:54 AM Performed by: Dorthea Cove, CRNA Pre-anesthesia Checklist: Patient identified, Emergency Drugs available, Suction available, Patient being monitored and Timeout performed Patient Re-evaluated:Patient Re-evaluated prior to induction Oxygen Delivery Method: Nasal cannula Preoxygenation: Pre-oxygenation with 100% oxygen Induction Type: IV induction Placement Confirmation: positive ETCO2 Dental Injury: Teeth and Oropharynx as per pre-operative assessment

## 2020-09-05 NOTE — Progress Notes (Signed)
PT Cancellation Note  Patient Details Name: Thomas [name] MRN: [id number] DOB: 1955-05-25   Cancelled Treatment:    Reason Eval/Treat Not Completed: (P) Patient at procedure or test/unavailable Pt is off the floor for colonoscopy. PT will follow back for evaluation this afternoon as able.   Hiba Garry B. Migdalia Dk PT, DPT Acute Rehabilitation Services Pager [phone] Office [phone]    [place] 09/05/2020, 10:16 AM

## 2020-09-05 NOTE — Progress Notes (Signed)
Family Medicine Teaching Service Daily Progress Note Intern Pager: [phone]  Patient name: Thomas [name] Medical record number: [id number] Date of birth: 1956-02-20 Age: 65 y.o. 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 male who presents with abdominal pain found to have a likely malignant tumor in the transverse colon. PMHx significant for epilepsy, T2DM, HTN, frontal meningioma s/p lobectomy.  Obstructive transverse colon mass Noted on colonoscopy this morning, concerning for malignancy.  Biopsied, pending.  Rectal polyp excised also sent for pathology.  No abdominal pain currently.  Mildly anemic with slight drop in hemoglobin from 12.7 to 11.9 this morning.  General surgery consulted, planning for colectomy likely early next week pending pathology result of rectal polyp. - GI and general surgery following, appreciate recommendations - clear liquid diet, not to advance past clears - CEA - CT chest recommended  Systolic murmur Heard on exam, patient states this has been known for 1 month but no work-up has been done so far. - consider TTE  HTN Home medications include amlodipine 10 mg daily, HCTZ 25 mg daily, lisinopril 40 mg daily. BP mostly 062I to 948N systolic. - continue home medications  T2DM with peripheral neuropathy Most recent A1c 8.6 in 2018.  Home medications include metformin 500 mg twice daily.  Currently adequately controlled on sliding scale insulin. - moderate SSI - monitor CBG - continue home gabapentin 600 mg 3 times daily as needed  Peripheral edema Home medications include furosemide 40 mg daily.  Patient states he was started on this medication for leg swelling by his PCP.  1+ pitting  edema on exam today. - hold furosemide for now, consider restarting - consider TTE  Epilepsy - continue home levetiracetam 1000 mg BID  GERD - continue PPI  FEN/GI: Clear liquid diet PPx: [place]  Disposition: med-surg  Subjective:  NAOE.  Patient seen this afternoon following colonoscopy.  Denies any abdominal pain currently.   Objective: Temp:  [97.6 F (36.4 C)-98 F (36.7 C)] 97.6 F (36.4 C) (04/29 0354) Pulse Rate:  [62-86] 70 (04/29 0354) Resp:  [10-20] 17 (04/29 0354) BP: (124-157)/(67-87) 125/77 (04/29 0354) SpO2:  [92 %-100 %] 98 % (04/29 0354) Physical Exam: General: Obese older male sitting up in chair comfortably, NAD Cardiovascular: RRR, 2/6 systolic murmur best heard at the LUSB without radiation to the carotids Respiratory: Clear to auscultation bilaterally, no respiratory distress Abdomen: Soft, nontender Extremities: Warm and well perfused, 1+ pitting edema to mid calf bilaterally  Laboratory: Recent Labs  Lab 09/04/20 0659 09/05/20 0348  WBC 13.2*  13.3* 12.3*  HGB 12.6*  12.7* 11.9*  HCT 37.3*  37.2* 35.6*  PLT 381  368 350   Recent Labs  Lab 09/04/20 0659 09/04/20 0905 09/05/20 0348  NA 135  --  136  K 4.7  --  4.0  CL 97*  --  99  CO2 29  --  28  BUN 16  --  13  CREATININE 1.34*  --  1.17  CALCIUM 9.4  --  9.2  PROT  --  6.6 7.4  BILITOT  --  0.5 0.8  ALKPHOS  --  73 82  ALT  --  19 21  AST  --  13* 14*  GLUCOSE  227*  --  178*     Imaging/Diagnostic Tests: No new imaging.  Thomas Button, MD 09/05/2020, 7:15 AM PGY-1, [place] Intern pager: [phone], text pages welcome

## 2020-09-05 NOTE — Transfer of Care (Signed)
Immediate Anesthesia Transfer of Care Note  Patient: Thomas [name]  Procedure(s) Performed: COLONOSCOPY WITH PROPOFOL (N/A ) BIOPSY SUBMUCOSAL TATTOO INJECTION POLYPECTOMY  Patient Location: Endoscopy Unit  Anesthesia Type:MAC  Level of Consciousness: awake, alert  and oriented  Airway & Oxygen Therapy: Patient Spontanous Breathing  Post-op Assessment: Report given to RN and Post -op Vital signs reviewed and stable  Post vital signs: Reviewed and stable  Last Vitals:  Vitals Value Taken Time  BP 133/78 09/05/20 1023  Temp    Pulse 90 09/05/20 1024  Resp 20 09/05/20 1024  SpO2 97 % 09/05/20 1024  Vitals shown include unvalidated device data.  Last Pain:  Vitals:   09/05/20 0853  TempSrc: Oral  PainSc: 0-No pain         Complications: No complications documented.

## 2020-09-05 NOTE — Anesthesia Postprocedure Evaluation (Signed)
Anesthesia Post Note  Patient: Thomas [name]  Procedure(s) Performed: COLONOSCOPY WITH PROPOFOL (N/A ) BIOPSY SUBMUCOSAL TATTOO INJECTION POLYPECTOMY     Patient location during evaluation: Endoscopy Anesthesia Type: MAC Level of consciousness: awake and alert Pain management: pain level controlled Vital Signs Assessment: post-procedure vital signs reviewed and stable Respiratory status: spontaneous breathing, nonlabored ventilation and respiratory function stable Cardiovascular status: blood pressure returned to baseline and stable Postop Assessment: no apparent nausea or vomiting Anesthetic complications: no   No complications documented.  Last Vitals:  Vitals:   09/05/20 0853 09/05/20 1025  BP: (!) 147/77 133/78  Pulse: 80 88  Resp: 19 16  Temp: 36.5 C   SpO2: 98% 97%    Last Pain:  Vitals:   09/05/20 1025  TempSrc: Temporal  PainSc: 0-No pain                 Lynda Rainwater

## 2020-09-05 NOTE — Op Note (Addendum)
[facility] Patient Name: Thomas [name] Procedure Date : 09/05/2020 MRN: [id number] Attending MD: Docia Chuck. Henrene Pastor , MD Date of Birth: Sep 27, 1955 CSN: [id number] Age: 65 Admit Type: Inpatient Procedure:                Colonoscopy with biopsies, cold snare polypectomy,                            submucosal injection. Indications:              Abdominal pain, Abnormal CT of the GI tract                            (transverse colon mass), Change in bowel habits,                            Constipation Providers:                Docia Chuck. Henrene Pastor, MD, Josie Dixon, RN, Laverda Sorenson, Technician, Dorthea Cove, CRNA Referring MD:             [place] hospitalist Medicines:                Monitored Anesthesia Care Complications:            No immediate complications. Estimated blood loss:                            None. Estimated Blood Loss:     Estimated blood loss: none. Procedure:                Pre-Anesthesia Assessment:                           - Prior to the procedure, a History and Physical                            was performed, and patient medications and                            allergies were reviewed. The patient's tolerance of                            previous anesthesia was also reviewed. The risks                            and benefits of the procedure and the sedation                            options and risks were discussed with the patient.                            All questions were answered, and informed consent  was obtained. Prior Anticoagulants: The patient has                            taken no previous anticoagulant or antiplatelet                            agents. After reviewing the risks and benefits, the                            patient was deemed in satisfactory condition to                            undergo the procedure.                           After obtaining informed consent,  the colonoscope                            was passed under direct vision. Throughout the                            procedure, the patient's blood pressure, pulse, and                            oxygen saturations were monitored continuously. The                            CF-HQ190L ([id number]) Olympus colonoscope was                            introduced through the anus and advanced to the the                            transverse colon. Could not go beyond secondary to                            obstructing mass. The rectum was photographed. The                            quality of the bowel preparation was good. The                            colonoscopy was performed without difficulty. The                            patient tolerated the procedure well. The bowel                            preparation used was MoviPrep via split dose                            instruction. Scope In: 9:56:51 AM Scope Out: 10:16:57 AM Total Procedure Duration: 0 hours 20 minutes 6 seconds  Findings:      An ulcerated  near completely obstructing large mass was found in the       transverse colon. The mass was circumferential. No bleeding was present,       but friable. This was biopsied with a cold forceps for histology. Area       was tattooed with an injection of Spot (carbon black) in 2 areas just       distal (downstream) from the lesion, each 180 degrees apart. See       photograph. Due to the obstructive nature of the mass, the colonoscope       could not be passed beyond this area.      A 6 mm polyp was found in the rectum. The polyp was pedunculated. This       was removed with a cold snare and submitted for pathologic analysis.      A few small-mouthed diverticula were found in the left colon.      The exam was otherwise without abnormality on direct and retroflexion       views. 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 Biopsied. Tattooed.                           - One 6 mm polyp in the rectum. Status post                            polypectomy                           - Diverticulosis in the left colon.                           - The examination was otherwise normal on direct                            and retroflexion views, though the colonoscope                            could not be passed beyond the obstructive cancer. Moderate Sedation:      none Recommendation:           1. Clear liquid diet                           2. General surgical consultation today                           3. CEA level                           4. Follow-up pathology results                           5. Will need follow-up colonoscopy in 1 year  The findings were reviewed with the patient                            personally. He was provided a copy of this                            procedure report. GI available as needed Procedure Code(s):        --- Professional ---                           [phone], 52, Colonoscopy, flexible; with directed                            submucosal injection(s), any substance                           34742, 52, Colonoscopy, flexible; with biopsy,                            single or multiple Diagnosis Code(s):        --- Professional ---                           C18.4, Malignant neoplasm of transverse colon                           K56.691, Other complete intestinal obstruction                           K62.1, Rectal polyp                           R10.9, Unspecified abdominal pain                           R19.4, Change in bowel habit                           K59.00, Constipation, unspecified                           K57.30, Diverticulosis of large intestine without                            perforation or abscess without bleeding                           R93.3, Abnormal findings on diagnostic imaging of                            other parts  of digestive tract CPT copyright 2019 [employer]. All rights reserved. The codes documented in this report are preliminary and upon coder review may  be revised to meet current compliance requirements. Docia Chuck. Henrene Pastor, MD 09/05/2020 10:33:34 AM This report has been signed electronically. Number of Addenda: 0

## 2020-09-05 NOTE — Consult Note (Signed)
Thomas [name] 1956-04-11  [id number].    Requesting MD: Dr. 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 male with a hx of HTN, HLD, GERD, Epilespy, DM2, asthma and meningioma s/p craniotomy by Dr. Christella Noa in 2018 who presented to the ED on 4/28 with abdominal pain.   Patient reports in the last 1 month he has been having intermittent, generalized crampy abdominal pain with associated constipation.  He notes over the last 1 week his pain became more constant and severe in his epigastrium with more mild pain radiating to his back and throughout the remainder of his abdomen.  He has been trying to take MiraLAX at home for his constipation with some relief.  He denies any fatigue, decreased appetite, nausea, vomiting, weight loss, blood in stool or melena. No family hx of colon cancer. No prior abdominal surgeries. He is not on any blood thinners at home.   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 Regional lymph nodes are small but increased since 2011. GI was consulted and he underwent colonoscopy 4/29 by Dr. 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 This also showed one 6 mm polyp in the rectum status post polypectomy. We were asked to see.   He states he has been passing flatus and multiple BMs as part of colonoscopy prep. He has had some nausea today immediately following procedure but resolved and tolerating clears well. He has not had fever/chills recently or leading up to admission. He lost 12 pounds intentionally over the last 3 months but has regained some of the weight. He has had intermittent nausea and emesis over the past 30 days with worsening constipation and abdominal pain leading up to ED presentation.   He denies abdominal pain currently. He denies dyspnea on exertion, cough,  chest pain, SHOB, history of MI.  Substance use: none Prior abdominal sx: none Anticoagulation: none  Prior smoker - quit 35 years ago.  ROS: Review of Systems  Constitutional: Negative for chills, fever, malaise/fatigue and weight loss.  Respiratory: Negative for cough, shortness of breath and wheezing.   Cardiovascular: Negative for chest pain and leg swelling.  Gastrointestinal: Positive for abdominal pain and constipation. Negative for blood in stool, diarrhea, melena, nausea and vomiting.  All other systems reviewed and are negative.   Family History  Family history unknown: Yes    Past Medical History:  Diagnosis Date  . Asthma    as a child  . 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 Confusion    occasionally  . Diabetes mellitus without complication ([place])    takes Metformin daily.  . Dizziness   . Dyspnea    pt states d/t weight  . Epilepsy ([place])    takes Keppra daily  . GERD (gastroesophageal reflux disease)    takes Omeprazole daily  . Headache   . Hyperlipidemia    takes Atorvastatin daily  . Hypertension    takes Lotrel daily  . Peripheral edema    takes Lasix daily  . Peripheral neuropathy    takes Gabapentin daily  . Pneumonia 3 yrs ago   hx of  . Seizures ([place])   . Sleep apnea   . Urinary frequency     Past Surgical History:  Procedure Laterality Date  . CARDIAC CATHETERIZATION  7 yrs ago  . CRANIOTOMY N/A 09/01/2016   Procedure: CRANIOTOMY TUMOR  LEFT PTERIONAL;  Surgeon: Ashok Pall, MD;  Location: [facility];  Service: Neurosurgery;  Laterality: N/A;  . cyst removed from chest      as a child  . NO PAST SURGERIES      Social History:  reports that he has quit smoking. He has never used smokeless tobacco. He reports that he does not drink alcohol and does not use drugs.  Allergies:  Allergies  Allergen Reactions  . No Known Allergies     Medications Prior to Admission  Medication Sig Dispense Refill  . acetaminophen (TYLENOL) 500 MG tablet  Take 1,000 mg by mouth every 6 (six) hours as needed for headache (pain).     Marland Kitchen albuterol (PROVENTIL HFA;VENTOLIN HFA) 108 (90 BASE) MCG/ACT inhaler Inhale 2 puffs into the lungs every 4 (four) hours as needed for wheezing or shortness of breath. 1 Inhaler 1  . amLODipine (NORVASC) 10 MG tablet Take 10 mg by mouth daily.    Marland Kitchen atorvastatin (LIPITOR) 20 MG tablet Take 20 mg by mouth daily at 6 PM.     . calcium carbonate (TUMS - DOSED IN MG ELEMENTAL CALCIUM) 500 MG chewable tablet Chew 1-2 tablets by mouth 2 (two) times daily as needed for indigestion or heartburn (DEPENDS ON INDIGESTION IF TAKES 1-2 TABLETS).    . Chlorpheniramine-Acetaminophen (CORICIDIN HBP COLD/FLU PO) Take 2 tablets by mouth 2 (two) times daily as needed (COLD SYMPTOMS).    . furosemide (LASIX) 20 MG tablet Take 2 tablets (40 mg total) by mouth daily. 60 tablet 3  . gabapentin (NEURONTIN) 300 MG capsule Take 1 capsule (300 mg total) by mouth 3 (three) times daily. (Patient taking differently: Take 600 mg by mouth 3 (three) times daily.) 90 capsule 1  . hydrochlorothiazide (HYDRODIURIL) 25 MG tablet Take 25 mg by mouth daily.    Marland Kitchen levETIRAcetam (KEPPRA) 1000 MG tablet Take 1,000 mg by mouth 2 (two) times daily.  0  . lisinopril (ZESTRIL) 40 MG tablet Take 40 mg by mouth daily.    . metFORMIN (GLUCOPHAGE-XR) 500 MG 24 hr tablet Take 500 mg by mouth 2 (two) times daily.    Marland Kitchen omeprazole (PRILOSEC) 20 MG capsule Take 20 mg by mouth daily.  0  . polyethylene glycol powder (GLYCOLAX/MIRALAX) 17 GM/SCOOP powder Take 17 g by mouth daily as needed for mild constipation.    . Simethicone (GAS-X PO) Take 1 tablet by mouth daily as needed (stomach/gas).    Marland Kitchen UNABLE TO FIND 2 (two) times a week. Herbal Life Protein Powder: Mix into a shake and drink once a day    . Lacosamide (VIMPAT) 100 MG TABS Take 1 tablet (100 mg total) by mouth 2 (two) times daily. (Patient not taking: Reported on 05/25/2016) 60 tablet 2     Physical Exam: Blood  pressure (!) 160/87, pulse 81, temperature 97.7 F (36.5 C), temperature source Oral, resp. rate (!) 23, height 5\' 8"  (1.727 m), SpO2 100 %. General: pleasant, WD/WN AA male who is laying in bed in NAD HEENT: head is normocephalic, atraumatic.  Sclera are noninjected.  Pupils equal and round.  Ears and nose without any masses or lesions.  Mouth is pink and moist. Dentition fair Heart: regular, rate, and rhythm.  Normal s1,s2. No obvious murmurs, gallops, or rubs noted.  Palpable pedal pulses bilaterally  Lungs: CTAB, no wheezes, rhonchi, or rales noted.  Respiratory effort nonlabored Abd: protuberant, soft, ND,  NT, +BS, no palpable masses, hernias, or organomegaly MS: no BUE/BLE edema, calves soft and nontender Skin: warm and dry with no masses, lesions, or rashes Psych: A&Ox4 with an appropriate affect Neuro: cranial nerves grossly intact, normal speech, thought process intact, gait not assessed   Results for orders placed or performed during the hospital encounter of 09/04/20 (from the past 48 hour(s))  Basic metabolic panel     Status: Abnormal   Collection Time: 09/04/20  6:59 AM  Result Value Ref Range   Sodium 135 135 - 145 mmol/L   Potassium 4.7 3.5 - 5.1 mmol/L   Chloride 97 (L) 98 - 111 mmol/L   CO2 29 22 - 32 mmol/L   Glucose, Bld 227 (H) 70 - 99 mg/dL    Comment: Glucose reference range applies only to samples taken after fasting for at least 8 hours.   BUN 16 8 - 23 mg/dL   Creatinine, Ser 1.34 (H) 0.61 - 1.24 mg/dL   Calcium 9.4 8.9 - 10.3 mg/dL   GFR, Estimated 60 (L) >60 mL/min    Comment: (NOTE) Calculated using the CKD-EPI Creatinine Equation (2021)    Anion gap 9 5 - 15    Comment: Performed at [facility] [address], [place], [place] [address]  CBC     Status: Abnormal   Collection Time: 09/04/20  6:59 AM  Result Value Ref Range   WBC 13.3 (H) 4.0 - 10.5 K/uL   RBC 4.97 4.22 - 5.81 MIL/uL   Hemoglobin 12.7 (L) 13.0 - 17.0 g/dL   HCT 37.2 (L) 39.0 -  52.0 %   MCV 74.8 (L) 80.0 - 100.0 fL   MCH 25.6 (L) 26.0 - 34.0 pg   MCHC 34.1 30.0 - 36.0 g/dL   RDW 14.8 11.5 - 15.5 %   Platelets 368 150 - 400 K/uL   nRBC 0.0 0.0 - 0.2 %    Comment: Performed at [facility], [place] [address], [place], [place] [address]  Troponin I (High Sensitivity)     Status: None   Collection Time: 09/04/20  6:59 AM  Result Value Ref Range   Troponin I (High Sensitivity) 9 <18 ng/L    Comment: (NOTE) Elevated high sensitivity troponin I (hsTnI) values and significant  changes across serial measurements may suggest ACS but many other  chronic and acute conditions are known to elevate hsTnI results.  Refer to the "Links" section for chest pain algorithms and additional  guidance. Performed at [facility], [place] [address] [place], [place] [address]   CBC with Differential/Platelet     Status: Abnormal   Collection Time: 09/04/20  6:59 AM  Result Value Ref Range   WBC 13.2 (H) 4.0 - 10.5 K/uL   RBC 4.98 4.22 - 5.81 MIL/uL   Hemoglobin 12.6 (L) 13.0 - 17.0 g/dL   HCT 37.3 (L) 39.0 - 52.0 %   MCV 74.9 (L) 80.0 - 100.0 fL   MCH 25.3 (L) 26.0 - 34.0 pg   MCHC 33.8 30.0 - 36.0 g/dL   RDW 14.9 11.5 - 15.5 %   Platelets 381 150 - 400 K/uL   nRBC 0.0 0.0 - 0.2 %   Neutrophils Relative % 67 %   Neutro Abs 9.0 (H) 1.7 - 7.7 K/uL   Lymphocytes Relative 20 %   Lymphs Abs 2.6 0.7 - 4.0 K/uL   Monocytes Relative 8 %   Monocytes Absolute 1.0 0.1 - 1.0 K/uL   Eosinophils  Relative 3 %   Eosinophils Absolute 0.3 0.0 - 0.5 K/uL   Basophils Relative 1 %   Basophils Absolute 0.1 0.0 - 0.1 K/uL   Immature Granulocytes 1 %   Abs Immature Granulocytes 0.06 0.00 - 0.07 K/uL    Comment: Performed at [facility] [address], [place], [place] [address]  Troponin I (High Sensitivity)     Status: None   Collection Time: 09/04/20  9:05 AM  Result Value Ref Range   Troponin I (High Sensitivity) 7 <18 ng/L    Comment: (NOTE) Elevated high sensitivity  troponin I (hsTnI) values and significant  changes across serial measurements may suggest ACS but many other  chronic and acute conditions are known to elevate hsTnI results.  Refer to the Links section for chest pain algorithms and additional  guidance. Performed at [facility], [place] [address], [place], [place] [address]   Hepatic function panel     Status: Abnormal   Collection Time: 09/04/20  9:05 AM  Result Value Ref Range   Total Protein 6.6 6.5 - 8.1 g/dL   Albumin 3.0 (L) 3.5 - 5.0 g/dL   AST 13 (L) 15 - 41 U/L   ALT 19 0 - 44 U/L   Alkaline Phosphatase 73 38 - 126 U/L   Total Bilirubin 0.5 0.3 - 1.2 mg/dL   Bilirubin, Direct <0.1 0.0 - 0.2 mg/dL   Indirect Bilirubin NOT CALCULATED 0.3 - 0.9 mg/dL    Comment: Performed at [facility] [address], [place], [place] [address]  Resp Panel by RT-PCR (Flu A&B, Covid) Nasopharyngeal Swab     Status: None   Collection Time: 09/04/20  1:50 PM   Specimen: Nasopharyngeal Swab; Nasopharyngeal(NP) swabs in vial transport medium  Result Value Ref Range   SARS Coronavirus 2 by RT PCR NEGATIVE NEGATIVE    Comment: (NOTE) SARS-CoV-2 target nucleic acids are NOT DETECTED.  The SARS-CoV-2 RNA is generally detectable in upper respiratory specimens during the acute phase of infection. The lowest concentration of SARS-CoV-2 viral copies this assay can detect is 138 copies/mL. A negative result does not preclude SARS-Cov-2 infection and should not be used as the sole basis for treatment or other patient management decisions. A negative result may occur with  improper specimen collection/handling, submission of specimen other than nasopharyngeal swab, presence of viral mutation(s) within the areas targeted by this assay, and inadequate number of viral copies(<138 copies/mL). A negative result must be combined with clinical observations, patient history, and epidemiological information. The expected result is Negative.  Fact  Sheet for Patients:  EntrepreneurPulse.com.au  Fact Sheet for Healthcare Providers:  IncredibleEmployment.be  This test is no t yet approved or cleared by the [place] FDA and  has been authorized for detection and/or diagnosis of SARS-CoV-2 by FDA under an Emergency Use Authorization (EUA). This EUA will remain  in effect (meaning this test can be used) for the duration of the COVID-19 declaration under Section 564(b)(1) of the Act, 21 U.S.C.section 360bbb-3(b)(1), unless the authorization is terminated  or revoked sooner.       Influenza A by PCR NEGATIVE NEGATIVE   Influenza B by PCR NEGATIVE NEGATIVE    Comment: (NOTE) The Xpert Xpress SARS-CoV-2/FLU/RSV plus assay is intended as an aid in the diagnosis of influenza from Nasopharyngeal swab specimens and should not be used as a sole basis for treatment. Nasal washings and aspirates are unacceptable for Xpert Xpress SARS-CoV-2/FLU/RSV testing.  Fact Sheet for  Patients: EntrepreneurPulse.com.au  Fact Sheet for Healthcare Providers: IncredibleEmployment.be  This test is not yet approved or cleared by the [place] FDA and has been authorized for detection and/or diagnosis of SARS-CoV-2 by FDA under an Emergency Use Authorization (EUA). This EUA will remain in effect (meaning this test can be used) for the duration of the COVID-19 declaration under Section 564(b)(1) of the Act, 21 U.S.C. section 360bbb-3(b)(1), unless the authorization is terminated or revoked.  Performed at [facility], [place] [address], [place], [place] [address]   HIV Antibody (routine testing w rflx)     Status: None   Collection Time: 09/04/20  6:40 PM  Result Value Ref Range   HIV Screen 4th Generation wRfx Non Reactive Non Reactive    Comment: Performed at [facility], [place] [address] [place], [place] [address]  Glucose, capillary     Status: Abnormal    Collection Time: 09/04/20  8:35 PM  Result Value Ref Range   Glucose-Capillary 201 (H) 70 - 99 mg/dL    Comment: Glucose reference range applies only to samples taken after fasting for at least 8 hours.  Comprehensive metabolic panel     Status: Abnormal   Collection Time: 09/05/20  3:48 AM  Result Value Ref Range   Sodium 136 135 - 145 mmol/L   Potassium 4.0 3.5 - 5.1 mmol/L   Chloride 99 98 - 111 mmol/L   CO2 28 22 - 32 mmol/L   Glucose, Bld 178 (H) 70 - 99 mg/dL    Comment: Glucose reference range applies only to samples taken after fasting for at least 8 hours.   BUN 13 8 - 23 mg/dL   Creatinine, Ser 1.17 0.61 - 1.24 mg/dL   Calcium 9.2 8.9 - 10.3 mg/dL   Total Protein 7.4 6.5 - 8.1 g/dL   Albumin 3.3 (L) 3.5 - 5.0 g/dL   AST 14 (L) 15 - 41 U/L   ALT 21 0 - 44 U/L   Alkaline Phosphatase 82 38 - 126 U/L   Total Bilirubin 0.8 0.3 - 1.2 mg/dL   GFR, Estimated >60 >60 mL/min    Comment: (NOTE) Calculated using the CKD-EPI Creatinine Equation (2021)    Anion gap 9 5 - 15    Comment: Performed at [facility] [address], [place], [place] [address]  CBC     Status: Abnormal   Collection Time: 09/05/20  3:48 AM  Result Value Ref Range   WBC 12.3 (H) 4.0 - 10.5 K/uL   RBC 4.78 4.22 - 5.81 MIL/uL   Hemoglobin 11.9 (L) 13.0 - 17.0 g/dL   HCT 35.6 (L) 39.0 - 52.0 %   MCV 74.5 (L) 80.0 - 100.0 fL   MCH 24.9 (L) 26.0 - 34.0 pg   MCHC 33.4 30.0 - 36.0 g/dL   RDW 14.9 11.5 - 15.5 %   Platelets 350 150 - 400 K/uL   nRBC 0.0 0.0 - 0.2 %    Comment: Performed at [facility], [place] [address], [place], [place] [address]  Glucose, capillary     Status: Abnormal   Collection Time: 09/05/20  8:17 AM  Result Value Ref Range   Glucose-Capillary 190 (H) 70 - 99 mg/dL    Comment: Glucose reference range applies only to samples taken after fasting for at least 8 hours.   DG Chest 2 View  Result Date: 09/04/2020 CLINICAL DATA:  Chest pain. EXAM: CHEST - 2 VIEW COMPARISON:  04/01/2017. FINDINGS: Mediastinum and hilar structures normal. Heart size normal. No focal infiltrate. No pleural effusion or pneumothorax. No acute bony abnormality. IMPRESSION: No acute cardiopulmonary disease. Electronically Signed   By: Marcello Moores  Register   On: 09/04/2020 07:13   CT Abdomen Pelvis W Contrast  Result Date: 09/04/2020 CLINICAL DATA:  65 year old male with abdominal pain and weakness. EXAM: CT ABDOMEN AND PELVIS WITH CONTRAST TECHNIQUE: Multidetector CT imaging of the abdomen and pelvis was performed using the standard protocol following bolus administration of intravenous contrast. CONTRAST:  148mL OMNIPAQUE IOHEXOL 300 MG/ML  SOLN COMPARISON:  CT Abdomen and Pelvis 06/07/2009. FINDINGS: Lower chest: Negative; stable tiny calcified granuloma in the left lower lobe on series 5, image 2. Hepatobiliary: Liver enhancement is within normal limits. Negative gallbladder. No bile duct enlargement. Pancreas: Negative. Spleen: Negative. Adrenals/Urinary Tract: Normal adrenal glands. Kidneys appear little changed since 2011 and within normal limits. A simple fluid density left renal midpole cyst has mildly enlarged since that time. Smaller similar appearing right renal midpole cyst suspected. Symmetric renal enhancement and contrast excretion. No nephrolithiasis. Negative ureters and bladder. Stomach/Bowel: Possible large bowel peristalsis in the mid transverse colon on series 3, image 40. Although there are small but conspicuous regional lymph nodes which are increased from 2011 (coronal image 39. The transverse colon segment and question is about 3.7 cm in length. No dilated upstream large bowel. Negative colon otherwise. Normal retrocecal appendix on series 3, image 57. Decompressed and negative terminal ileum. No dilated small bowel. Decompressed and negative stomach, duodenum. No free air, free fluid. Vascular/Lymphatic: Aortoiliac calcified atherosclerosis. Major arterial structures in the abdomen  and pelvis are patent. Normal caliber abdominal aorta. Patent portal venous system. Reproductive: Stable small fat containing left inguinal hernia. Other: No pelvic free fluid. There is mild pelvic lipomatosis (series 3, image 84). Musculoskeletal: No acute osseous abnormality identified. 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 IMPRESSION: 1. Cannot exclude a 3.7 cm length apple-core type Mass/Carcinoma of the mid transverse colon, although artifact due to large bowel peristalsis is possible. Regional lymph nodes are small but increased since 2011. Recommend large bowel Endoscopy. 2. Otherwise stable, negative CT Abdomen and Pelvis. Aortic Atherosclerosis (ICD10-I70.0). Electronically Signed   By: Genevie Ann M.D.   On: 09/04/2020 08:51   Anti-infectives (From admission, onward)   None       Assessment/Plan MMP including HTN, HLD, GERD, Epilespy, DM2 and Asthma Hx of meningioma s/p craniotomy by Dr. Christella Noa in 2018  Completely obstructing tumor in the transverse colon.  - CT scan 4/28 showed: Cannot exclude a 3.7 cm length apple-core type Mass/Carcinoma of the mid transverse colon, although artifact due to large bowel peristalsis is possible. Regional lymph nodes are small but increased since 2011.  - S/p Colonoscopy 4/29 by Dr. 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 This also showed one 6 mm polyp in the rectum status post polypectomy. Await Path - Recommend CEA and CT Chest - Patient will need OR for colectomy during admission. Would ask that he be medically optimized for surgery by family medicine  - Do not advance passed clears. Ensure breeze protein supplement  Disposition: await pathology and can likely plan for surgery next week  FEN - clears, IVF VTE - SCDs, Heparin subq ID - None currently   Winferd Humphrey, [facility] 09/05/2020, 11:00 AM Please  see Amion for pager number during day hours 7:00am-4:30pm

## 2020-09-06 DIAGNOSIS — C184 Malignant neoplasm of transverse colon: Secondary | ICD-10-CM | POA: Diagnosis not present

## 2020-09-06 DIAGNOSIS — K5730 Diverticulosis of large intestine without perforation or abscess without bleeding: Secondary | ICD-10-CM | POA: Diagnosis not present

## 2020-09-06 DIAGNOSIS — R1084 Generalized abdominal pain: Secondary | ICD-10-CM | POA: Diagnosis not present

## 2020-09-06 DIAGNOSIS — K621 Rectal polyp: Secondary | ICD-10-CM

## 2020-09-06 DIAGNOSIS — K6389 Other specified diseases of intestine: Secondary | ICD-10-CM | POA: Diagnosis not present

## 2020-09-06 LAB — CBC
HCT: 33.3 % — ABNORMAL LOW (ref 39.0–52.0)
Hemoglobin: 11.6 g/dL — ABNORMAL LOW (ref 13.0–17.0)
MCH: 25.6 pg — ABNORMAL LOW (ref 26.0–34.0)
MCHC: 34.8 g/dL (ref 30.0–36.0)
MCV: 73.5 fL — ABNORMAL LOW (ref 80.0–100.0)
Platelets: 316 10*3/uL (ref 150–400)
RBC: 4.53 MIL/uL (ref 4.22–5.81)
RDW: 14.8 % (ref 11.5–15.5)
WBC: 12.9 10*3/uL — ABNORMAL HIGH (ref 4.0–10.5)
nRBC: 0 % (ref 0.0–0.2)

## 2020-09-06 LAB — GLUCOSE, CAPILLARY
Glucose-Capillary: 202 mg/dL — ABNORMAL HIGH (ref 70–99)
Glucose-Capillary: 175 mg/dL — ABNORMAL HIGH (ref 70–99)
Glucose-Capillary: 182 mg/dL — ABNORMAL HIGH (ref 70–99)
Glucose-Capillary: 189 mg/dL — ABNORMAL HIGH (ref 70–99)

## 2020-09-06 LAB — CEA: CEA: 1.7 ng/mL (ref 0.0–4.7)

## 2020-09-06 MED ORDER — SIMETHICONE 80 MG PO CHEW
80.0000 mg | CHEWABLE_TABLET | Freq: Four times a day (QID) | ORAL | Status: DC | PRN
  Administered 2020-09-06: 80 mg via ORAL
  Filled 2020-09-06: qty 1

## 2020-09-06 NOTE — Progress Notes (Addendum)
Family Medicine Teaching Service Daily Progress Note Intern Pager: [phone]  Patient name: Thomas [name] Medical record number: [id number] Date of birth: [birth year] Age: 65 y.o. Gender: male  Primary Care Provider: Benito Mccreedy, MD Consultants: GI, General surgery  Code Status: FULL   Pt Overview and Major Events to Date:  4/28- admitted 4/29- colonoscopy with finding of likely malignant completely obstructing tumor in transverse colon  Assessment and Plan: [name] is a 65 y.o. male who presents with abdominal pain found to have a likely malignant tumor in the transverse colon. PMHx significant for epilepsy, T2DM, HTN, frontal meningioma s/p lobectomy.   Obstructive transverse colon mass Pt asymptomatic this morning. Colonoscopy on 4/29 concerning for malignancy. Biopsies pending. CEA 1.7    -GI and general surgery following, appreciate recommendations -F/u biopsy results -Clear liquid diet, not to advance past clears  Possible systolic murmur Possible systolic murmur noted on exam on admission patient states this has been known for 1 month but no work-up has been done so far. -Consider TTE   HTN BP 126/76 today Home medications: amlodipine 10 mg daily, HCTZ 25 mg daily, lisinopril 40 mg daily. -Continue home medications   T2DM with peripheral neuropathy Most recent A1c 8.6 in 2018. CBGs 189, 182 overnight. Home medications include metformin 500 mg twice daily.   -Moderate SSI -Monitor CBG -Continue home gabapentin 600 mg 3 times daily as needed   Peripheral edema Home medications: furosemide 40 mg daily.  -Hold furosemide for now, consider restarting -Consider TTE -Compression stockings   Epilepsy Home meds: levetiracetam 1000 mg BID -Continue home levetiracetam 1000 mg BID   GERD -Continue protonix 40mg  daily    FEN/GI: CLD, PPI PPx: SCDs   Status is: Inpatient  Remains inpatient appropriate because:Ongoing diagnostic testing needed not appropriate  for outpatient work up and IV treatments appropriate due to intensity of illness or inability to take PO   Dispo: The patient is from: Home              Anticipated d/c is to: LTAC              Patient currently is not medically stable to d/c.   Difficult to place patient No    Subjective:  Denies concerns overnight, no nausea, vomiting and diarrhea.   Objective: Temp:  [97.8 F (36.6 C)-99 F (37.2 C)] 99 F (37.2 C) (05/01 0425) Pulse Rate:  [88-97] 88 (05/01 0425) Resp:  [16-19] 16 (05/01 0425) BP: (126-136)/(60-76) 126/76 (05/01 0425) SpO2:  [95 %-98 %] 95 % (05/01 0425)   Physical Exam: General: Alert, no acute distress Cardio: Normal S1 and S2, RRR, no r/m/g Pulm: CTAB, normal work of breathing Abdomen: Bowel sounds normal. Abdomen soft and non-tender.  Extremities: No peripheral edema.  Neuro: Cranial nerves grossly intact  Laboratory: Recent Labs  Lab 09/05/20 0348 09/06/20 0019 09/07/20 0051  WBC 12.3* 12.9* 13.8*  HGB 11.9* 11.6* 11.0*  HCT 35.6* 33.3* 32.3*  PLT 350 316 310   Recent Labs  Lab 09/04/20 0659 09/04/20 0905 09/05/20 0348 09/07/20 0051  NA 135  --  136 135  K 4.7  --  4.0 3.5  CL 97*  --  99 99  CO2 29  --  28 26  BUN 16  --  13 12  CREATININE 1.34*  --  1.17 1.20  CALCIUM 9.4  --  9.2 8.9  PROT  --  6.6 7.4  --   BILITOT  --  0.5 0.8  --  ALKPHOS  --  73 82  --   ALT  --  19 21  --   AST  --  13* 14*  --   GLUCOSE 227*  --  178* 220*     Imaging/Diagnostic Tests: No results found. Lattie Haw, MD 09/07/2020, 6:53 AM PGY-2, [place] Intern pager: [phone], text pages welcome

## 2020-09-06 NOTE — Plan of Care (Signed)
  Problem: Education: Goal: Knowledge of General Education information will improve Description: Including pain rating scale, medication(s)/side effects and non-pharmacologic comfort measures Outcome: Progressing   Problem: Health Behavior/Discharge Planning: Goal: Ability to manage health-related needs will improve Outcome: Progressing   Problem: Clinical Measurements: Goal: Will remain free from infection Outcome: Progressing   Problem: Clinical Measurements: Goal: Ability to maintain clinical measurements within normal limits will improve Outcome: Progressing   Problem: Nutrition: Goal: Adequate nutrition will be maintained Outcome: Progressing   Problem: Coping: Goal: Level of anxiety will decrease Outcome: Progressing   Problem: Pain Managment: Goal: General experience of comfort will improve Outcome: Progressing   Problem: Elimination: Goal: Will not experience complications related to urinary retention Outcome: Progressing   Problem: Elimination: Goal: Will not experience complications related to bowel motility Outcome: Progressing   Problem: Safety: Goal: Ability to remain free from injury will improve Outcome: Progressing

## 2020-09-06 NOTE — Evaluation (Signed)
Occupational Therapy Evaluation Patient Details Name: Thomas [name] MRN: [id number] DOB: 23-Jan-1956 Today's Date: 09/06/2020    History of Present Illness 65 y/o male presented to ED on 4/28 with complaints of chest pain and abdominal pain. CT concering for possible malignancy of large bowel. 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 PMH: epilepsy, Type II DM, HTN, frontal meningioma s/p lobectomy, hx of acute respiratory failure with hypoxia.   Clinical Impression   Pt. Had difficulty with donning and doffing socks prior to hospitalization. Pt. Ed on use of AE for LE dressing and was able to return demo. Pt. Was I with mobility in room and out in hallway. Pt. States he knows how to use AE and does not need further OT. OT to sign off.     Follow Up Recommendations  No OT follow up    Equipment Recommendations       Recommendations for Other Services       Precautions / Restrictions Precautions Precautions: Fall Precaution Comments: Pt. reports 2 falls in the past 6 months Restrictions Weight Bearing Restrictions: No      Mobility Bed Mobility Overal bed mobility: Independent                  Transfers Overall transfer level: Independent                    Balance Overall balance assessment: No apparent balance deficits (not formally assessed)                                         ADL either performed or assessed with clinical judgement   ADL Overall ADL's : At baseline                                       General ADL Comments: Pt. was ed on AE for LE ADLs. Pt. has had difficulty putting on socks before.     Vision Baseline Vision/History: Cataracts (r eye is blurry from cataracts) Vision Assessment?: No apparent visual deficits     Perception     Praxis      Pertinent Vitals/Pain Pain Assessment: No/denies pain     Hand Dominance     Extremity/Trunk  Assessment Upper Extremity Assessment Upper Extremity Assessment: Overall WFL for tasks assessed   Lower Extremity Assessment Lower Extremity Assessment: Defer to PT evaluation       Communication Communication Communication: No difficulties   Cognition Arousal/Alertness: Awake/alert Behavior During Therapy: WFL for tasks assessed/performed Overall Cognitive Status: Within Functional Limits for tasks assessed                                 General Comments: reports mild cognitive deficits due to tumor resection in 2017   General Comments       Exercises     Shoulder Instructions      Home Living Family/patient expects to be discharged to:: Private residence Living Arrangements: Spouse/significant other;Children Available Help at Discharge: Family;Available PRN/intermittently Type of Home: House Home Access: Stairs to enter [employer] of Steps: 3 Entrance Stairs-Rails: Right;Left Home Layout: Two level     Bathroom Shower/Tub: Teacher, early years/pre: Standard  Home Equipment: Kasandra Knudsen - single point          Prior Functioning/Environment Level of Independence: Independent        Comments: independent with ADLs, does not drive since tumor resection in 2017        OT Problem List: Obesity      OT Treatment/Interventions:      OT Goals(Current goals can be found in the care plan section) Acute Rehab OT Goals Patient Stated Goal: go home  OT Frequency:     Barriers to D/C:            Co-evaluation              AM-PAC OT "6 Clicks" Daily Activity     Outcome Measure Help from another person eating meals?: None Help from another person taking care of personal grooming?: None Help from another person toileting, which includes using toliet, bedpan, or urinal?: None Help from another person bathing (including washing, rinsing, drying)?: None Help from another person to put on and taking off regular upper  body clothing?: None Help from another person to put on and taking off regular lower body clothing?: None 6 Click Score: 24   End of Session Nurse Communication:  (ok therapy)  Activity Tolerance: Patient tolerated treatment well Patient left: in bed;with call bell/phone within reach                   Time: 0940-1013 OT Time Calculation (min): 33 min Charges:  OT General Charges $OT Visit: 1 Visit OT Evaluation $OT Eval Moderate Complexity: 1 Mod  Reece Packer OT/L   [place] 09/06/2020, 12:18 PM

## 2020-09-06 NOTE — Progress Notes (Signed)
1 Day Post-Op   Subjective/Chief Complaint: Patient is comfortable No new complaints Had a soft bowel movement this morning Tolerating clear liquids Awaiting pathology report before planning colon resection  Objective: Vital signs in last 24 hours: Temp:  [98.1 F (36.7 C)-99.4 F (37.4 C)] 99.4 F (37.4 C) (04/30 0446) Pulse Rate:  [75-99] 99 (04/30 0446) Resp:  [13-23] 17 (04/30 0446) BP: (126-159)/(48-96) 135/68 (04/30 0446) SpO2:  [92 %-100 %] 98 % (04/30 0446) Weight:  [138.4 kg] 138.4 kg (04/30 0446) Last BM Date: 09/05/20  Intake/Output from previous day: 04/29 0701 - 04/30 0700 In: 600 [I.V.:600] Out: -  Intake/Output this shift: No intake/output data recorded.  General: pleasant, WD/WN AA male who is laying in bed in NAD HEENT: head is normocephalic, atraumatic.  Sclera are noninjected.  Pupils equal and round.  Ears and nose without any masses or lesions.  Mouth is pink and moist. Dentition fair Heart: regular, rate, and rhythm.  Normal s1,s2. No obvious murmurs, gallops, or rubs noted.  Palpable pedal pulses bilaterally  Lungs: CTAB, no wheezes, rhonchi, or rales noted.  Respiratory effort nonlabored Abd: protuberant, soft, ND, NT, +BS, no palpable masses, hernias, or organomegaly MS: no BUE/BLE edema, calves soft and nontender Skin: warm and dry with no masses, lesions, or rashes Psych: A&Ox4 with an appropriate affect Neuro: cranial nerves grossly intact, normal speech, thought process intact, gait not assessed  Lab Results:  Recent Labs    09/05/20 0348 09/06/20 0019  WBC 12.3* 12.9*  HGB 11.9* 11.6*  HCT 35.6* 33.3*  PLT 350 316   BMET Recent Labs    09/04/20 0659 09/05/20 0348  NA 135 136  K 4.7 4.0  CL 97* 99  CO2 29 28  GLUCOSE 227* 178*  BUN 16 13  CREATININE 1.34* 1.17  CALCIUM 9.4 9.2   PT/INR No results for input(s): LABPROT, INR in the last 72 hours. ABG No results for input(s): PHART, HCO3 in the last 72 hours.  Invalid  input(s): PCO2, PO2  Studies/Results: No results found.  Anti-infectives: Anti-infectives (From admission, onward)   None      Assessment/Plan: MMP including HTN, HLD, GERD, Epilespy, DM2 and Asthma Hx of meningioma s/p craniotomy by Dr. Christella Noa in 2018  Completely obstructing tumor in the transverse colon.  - CT scan 4/28 showed: Cannot exclude a 3.7 cm length apple-core type Mass/Carcinoma of the mid transverse colon, although artifact due to large bowel peristalsis is possible. Regional lymph nodes are small but increased since 2011.  - S/p Colonoscopy 4/29 by Dr. 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 This also showed one 6 mm polyp in the rectum status post polypectomy. Await Path - Recommend CEA and CT Chest for staging - Patient will need OR for colectomy during admission. Would ask that he be medically optimized for surgery by family medicine  - Do not advance passed clears. Ensure breeze protein supplement  Disposition: await pathology and can likely plan for surgery next week.  Will follow-up again on Monday morning.  FEN - clears, IVF VTE - SCDs, Heparin subq ID - None currently    LOS: 1 day    Thomas [name] 09/06/2020

## 2020-09-06 NOTE — Progress Notes (Signed)
Family Medicine Teaching Service Daily Progress Note Intern Pager: [phone]  Patient name: Thomas [name] Medical record number: [id number] Date of birth: 05-Dec-1955 Age: 65 y.o. Gender: male  Primary Care Provider: Benito Mccreedy, MD Consultants: GI, General Surgery Code Status: Full  Pt Overview and Major Events to Date:  4/28- admitted 4/29- colonoscopy with finding of likely malignant completely obstructing tumor in transverse colon  Assessment and Plan: Thomas [name] is a 65 y.o. male who presents with abdominal pain found to have a likely malignant tumor in the transverse colon. PMHx significant for epilepsy, T2DM, HTN, frontal meningioma s/p lobectomy.  Obstructive transverse colon mass Noted on colonoscopy yesterday, concerning for malignancy.  Biopsies pending.  Rectal polyp excised also sent for pathology. Mildly anemic, but hgb stable this morning from 11.9 yesterday to 11.6 today.  General surgery consulted, planning for colectomy likely early next week pending pathology result of rectal polyp. Patient complained of gas pain today, simethicone given. - GI and general surgery following, appreciate recommendations - F/u biopsy results - clear liquid diet, not to advance past clears - CEA  Systolic murmur Heard on exam, patient states this has been known for 1 month but no work-up has been done so far. - consider TTE  HTN Home medications include amlodipine 10 mg daily, HCTZ 25 mg daily, lisinopril 40 mg daily. BP mostly 376E-831D systolic. - continue home medications  T2DM with peripheral neuropathy Most recent A1c 8.6 in 2018.  Home medications include metformin 500 mg twice daily.  In the last 24 hours has ranged from 156-239. He has received 8U short acting insulin.  - moderate SSI - monitor CBG - continue home gabapentin 600 mg 3 times daily as needed  Peripheral edema Home medications include furosemide 40 mg daily.  Patient states he was started on this  medication for leg swelling by his PCP.  1+ pitting edema on exam today. - hold furosemide for now, consider restarting - consider TTE - compression stockings.  Epilepsy - continue home levetiracetam 1000 mg BID  GERD - continue PPI  FEN/GI: CLD, PPI PPx: SCDs   Status is: Inpatient  Remains inpatient appropriate because:Ongoing active pain requiring inpatient pain management and Ongoing diagnostic testing needed not appropriate for outpatient work up   Dispo: The patient is from: Home              Anticipated d/c is to: SNF              Patient currently is not medically stable to d/c.   Difficult to place patient No   Subjective:  Patient reports overall doing well. He reports some gas pain over night, wants to try simethicone.  Objective: Temp:  [97.6 F (36.4 C)-99.3 F (37.4 C)] 99.3 F (37.4 C) (04/29 2049) Pulse Rate:  [70-128] 93 (04/29 2049) Resp:  [13-23] 16 (04/29 2049) BP: (125-160)/(48-96) 152/70 (04/29 2049) SpO2:  [92 %-100 %] 92 % (04/29 2049) Physical Exam: General: appears younger than stated age, AAM,  Cardiovascular: RRR, 2/6 systolic murmur best heard at LUSB Respiratory: CTAB Abdomen: soft, NT, ND, normoactive bowel sounds Extremities: warm and well perfused, trace edema to mid calf b/l  Laboratory: Recent Labs  Lab 09/04/20 0659 09/05/20 0348  WBC 13.2*  13.3* 12.3*  HGB 12.6*  12.7* 11.9*  HCT 37.3*  37.2* 35.6*  PLT 381  368 350   Recent Labs  Lab 09/04/20 0659 09/04/20 0905 09/05/20 0348  NA 135  --  136  K  4.7  --  4.0  CL 97*  --  99  CO2 29  --  28  BUN 16  --  13  CREATININE 1.34*  --  1.17  CALCIUM 9.4  --  9.2  PROT  --  6.6 7.4  BILITOT  --  0.5 0.8  ALKPHOS  --  73 82  ALT  --  19 21  AST  --  13* 14*  GLUCOSE 227*  --  178*   Imaging/Diagnostic Tests: No results found.  [name], MD 09/06/2020, 12:51 AM PGY-2, [place] Intern pager: [phone], text pages welcome

## 2020-09-07 DIAGNOSIS — K6389 Other specified diseases of intestine: Secondary | ICD-10-CM | POA: Diagnosis not present

## 2020-09-07 LAB — GLUCOSE, CAPILLARY
Glucose-Capillary: 203 mg/dL — ABNORMAL HIGH (ref 70–99)
Glucose-Capillary: 262 mg/dL — ABNORMAL HIGH (ref 70–99)
Glucose-Capillary: 185 mg/dL — ABNORMAL HIGH (ref 70–99)
Glucose-Capillary: 159 mg/dL — ABNORMAL HIGH (ref 70–99)

## 2020-09-07 LAB — BASIC METABOLIC PANEL
Anion gap: 10 (ref 5–15)
BUN: 12 mg/dL (ref 8–23)
CO2: 26 mmol/L (ref 22–32)
Calcium: 8.9 mg/dL (ref 8.9–10.3)
Chloride: 99 mmol/L (ref 98–111)
Creatinine, Ser: 1.2 mg/dL (ref 0.61–1.24)
GFR, Estimated: >60 mL/min (ref >60)
Glucose, Bld: 220 mg/dL — ABNORMAL HIGH (ref 70–99)
Potassium: 3.5 mmol/L (ref 3.5–5.1)
Sodium: 135 mmol/L (ref 135–145)

## 2020-09-07 LAB — CBC
HCT: 32.3 % — ABNORMAL LOW (ref 39.0–52.0)
Hemoglobin: 11 g/dL — ABNORMAL LOW (ref 13.0–17.0)
MCH: 25.2 pg — ABNORMAL LOW (ref 26.0–34.0)
MCHC: 34.1 g/dL (ref 30.0–36.0)
MCV: 73.9 fL — ABNORMAL LOW (ref 80.0–100.0)
Platelets: 310 10*3/uL (ref 150–400)
RBC: 4.37 MIL/uL (ref 4.22–5.81)
RDW: 14.9 % (ref 11.5–15.5)
WBC: 13.8 10*3/uL — ABNORMAL HIGH (ref 4.0–10.5)
nRBC: 0 % (ref 0.0–0.2)

## 2020-09-07 LAB — FERRITIN: Ferritin: 106 ng/mL (ref 24–336)

## 2020-09-07 MED ORDER — GUAIFENESIN-DM 100-10 MG/5ML PO SYRP
5.0000 mL | ORAL_SOLUTION | ORAL | Status: DC | PRN
  Administered 2020-09-07 – 2020-09-10 (×8): 5 mL via ORAL
  Filled 2020-09-07 (×8): qty 5

## 2020-09-08 ENCOUNTER — Encounter (HOSPITAL_COMMUNITY): Payer: Self-pay | Admitting: Internal Medicine

## 2020-09-08 DIAGNOSIS — R1084 Generalized abdominal pain: Secondary | ICD-10-CM | POA: Diagnosis not present

## 2020-09-08 DIAGNOSIS — K6389 Other specified diseases of intestine: Secondary | ICD-10-CM | POA: Diagnosis not present

## 2020-09-08 LAB — CBC WITH DIFFERENTIAL/PLATELET
Abs Immature Granulocytes: 0.06 10*3/uL (ref 0.00–0.07)
Basophils Absolute: 0.1 10*3/uL (ref 0.0–0.1)
Basophils Relative: 1 %
Eosinophils Absolute: 0.6 10*3/uL — ABNORMAL HIGH (ref 0.0–0.5)
Eosinophils Relative: 5 %
HCT: 33 % — ABNORMAL LOW (ref 39.0–52.0)
Hemoglobin: 11.6 g/dL — ABNORMAL LOW (ref 13.0–17.0)
Immature Granulocytes: 1 %
Lymphocytes Relative: 16 %
Lymphs Abs: 2 10*3/uL (ref 0.7–4.0)
MCH: 25.9 pg — ABNORMAL LOW (ref 26.0–34.0)
MCHC: 35.2 g/dL (ref 30.0–36.0)
MCV: 73.7 fL — ABNORMAL LOW (ref 80.0–100.0)
Monocytes Absolute: 1.1 10*3/uL — ABNORMAL HIGH (ref 0.1–1.0)
Monocytes Relative: 9 %
Neutro Abs: 8.8 10*3/uL — ABNORMAL HIGH (ref 1.7–7.7)
Neutrophils Relative %: 68 %
Platelets: 301 10*3/uL (ref 150–400)
RBC: 4.48 MIL/uL (ref 4.22–5.81)
RDW: 14.6 % (ref 11.5–15.5)
WBC: 12.7 10*3/uL — ABNORMAL HIGH (ref 4.0–10.5)
nRBC: 0 % (ref 0.0–0.2)

## 2020-09-08 LAB — BASIC METABOLIC PANEL
Anion gap: 10 (ref 5–15)
BUN: 14 mg/dL (ref 8–23)
CO2: 24 mmol/L (ref 22–32)
Calcium: 8.8 mg/dL — ABNORMAL LOW (ref 8.9–10.3)
Chloride: 100 mmol/L (ref 98–111)
Creatinine, Ser: 1.23 mg/dL (ref 0.61–1.24)
GFR, Estimated: >60 mL/min (ref >60)
Glucose, Bld: 227 mg/dL — ABNORMAL HIGH (ref 70–99)
Potassium: 4.1 mmol/L (ref 3.5–5.1)
Sodium: 134 mmol/L — ABNORMAL LOW (ref 135–145)

## 2020-09-08 LAB — GLUCOSE, CAPILLARY
Glucose-Capillary: 209 mg/dL — ABNORMAL HIGH (ref 70–99)
Glucose-Capillary: 209 mg/dL — ABNORMAL HIGH (ref 70–99)
Glucose-Capillary: 212 mg/dL — ABNORMAL HIGH (ref 70–99)
Glucose-Capillary: 138 mg/dL — ABNORMAL HIGH (ref 70–99)

## 2020-09-08 MED ORDER — INSULIN GLARGINE 100 UNIT/ML ~~LOC~~ SOLN
5.0000 [IU] | Freq: Every day | SUBCUTANEOUS | Status: DC
  Administered 2020-09-08: 5 [IU] via SUBCUTANEOUS
  Filled 2020-09-08 (×2): qty 0.05

## 2020-09-08 NOTE — Care Management Important Message (Signed)
Important Message  Patient Details  Name: Thomas [name] MRN: [id number] Date of Birth: April 21, 1956   Medicare Important Message Given:  Yes     [place] 09/08/2020, 1:51 PM

## 2020-09-08 NOTE — Progress Notes (Addendum)
Progress Note  3 Days Post-Op  Subjective: Patient reports mild abdominal discomfort with coughing. Denies nausea. Passing flatus and liquid stool. Aware that we are awaiting path prior to surgical planning.   Objective: Vital signs in last 24 hours: Temp:  [98.2 F (36.8 C)-98.9 F (37.2 C)] 98.2 F (36.8 C) (05/02 0452) Pulse Rate:  [83-95] 83 (05/02 0452) Resp:  [16-17] 16 (05/02 0452) BP: (113-136)/(54-68) 117/68 (05/02 0452) SpO2:  [93 %-98 %] 96 % (05/02 0452) Last BM Date: 09/06/20  Intake/Output from previous day: No intake/output data recorded. Intake/Output this shift: No intake/output data recorded.  PE: General: pleasant, WD, obese male who is laying in bed in NAD HEENT: head is normocephalic, atraumatic.  Sclera are noninjected.  PERRL.  Ears and nose without any masses or lesions.  Mouth is pink and moist Heart: regular, rate, and rhythm.  Normal s1,s2. No obvious murmurs, gallops, or rubs noted.  Palpable radial and pedal pulses bilaterally Lungs: CTAB, no wheezes, rhonchi, or rales noted.  Respiratory effort nonlabored Abd: soft, NT, ND, +BS, no masses, hernias, or organomegaly MS: all 4 extremities are symmetrical with no cyanosis, clubbing, or edema. Skin: warm and dry with no masses, lesions, or rashes Neuro: Cranial nerves 2-12 grossly intact, sensation is normal throughout Psych: A&Ox3 with an appropriate affect.    Lab Results:  Recent Labs    09/07/20 0051 09/08/20 0049  WBC 13.8* 12.7*  HGB 11.0* 11.6*  HCT 32.3* 33.0*  PLT 310 301   BMET Recent Labs    09/07/20 0051 09/08/20 0049  NA 135 134*  K 3.5 4.1  CL 99 100  CO2 26 24  GLUCOSE 220* 227*  BUN 12 14  CREATININE 1.20 1.23  CALCIUM 8.9 8.8*   PT/INR No results for input(s): LABPROT, INR in the last 72 hours. CMP     Component Value Date/Time   NA 134 (L) 09/08/2020 0049   K 4.1 09/08/2020 0049   CL 100 09/08/2020 0049   CO2 24 09/08/2020 0049   GLUCOSE 227 (H)  09/08/2020 0049   BUN 14 09/08/2020 0049   CREATININE 1.23 09/08/2020 0049   CALCIUM 8.8 (L) 09/08/2020 0049   PROT 7.4 09/05/2020 0348   ALBUMIN 3.3 (L) 09/05/2020 0348   AST 14 (L) 09/05/2020 0348   ALT 21 09/05/2020 0348   ALKPHOS 82 09/05/2020 0348   BILITOT 0.8 09/05/2020 0348   GFRNONAA >60 09/08/2020 0049   GFRAA >60 04/01/2017 1921   Lipase     Component Value Date/Time   LIPASE 23 06/14/2015 1020       Studies/Results: No results found.  Anti-infectives: Anti-infectives (From admission, onward)   None       Assessment/Plan MMP including HTN, HLD, GERD, Epilespy, DM2 and Asthma Hx of meningioma s/p craniotomy by Dr. Christella Noa in 2018  Partially obstructing tumor in the transverse colon. - CT scan 4/28 showed:Cannot exclude a 3.7 cm length apple-core type Mass/Carcinoma of the mid transverse colon, although artifact due to large bowel peristalsis is possible. Regional lymph nodes are small but increased since 2011.  - S/p Colonoscopy 4/29 by Dr. Henrene Pastor that showedMalignant completely obstructing tumor in the transverse colon. This also showed one 6 mm polyp in the rectumstatus post polypectomy. Awaiting Path -CEA 1.7 - Recommend non-contrast chest CT for staging as well - Patient will need OR for colectomy during admission, but he is not clinically obstructed and this does not need to be done emergently  - Do not  advance passed clears. Ensure breeze protein supplement  FEN -clears, IVF VTE -SCDs, Heparin subq ID -None currently  LOS: 3 days    Thomas [name], [facility] 09/08/2020, 11:05 AM Please see Amion for pager number during day hours 7:00am-4:30pm

## 2020-09-08 NOTE — Progress Notes (Addendum)
Family Medicine Teaching Service Daily Progress Note Intern Pager: [phone]  Patient name: Thomas [name] Medical record number: [id number] Date of birth: May 02, 1956 Age: 65 y.o. Gender: male  Primary Care Provider: Benito Mccreedy, MD Consultants: GI/ Gen Surgery Code Status: Full   Pt Overview and Major Events to Date:  4/28- admitted  4/29- Colonoscopy with finding of likely malignant completely obstructing tumor in transverse colon  Assessment and Plan:  Obstructive transverse colon mass Patient reports some epigastric discomfort, and physical exam is signifcant for mild LUQ distenstion. Patient continues to have BMs. Colonoscopy on 4/29 concerning for malignancy. Biopsies pending. CEA 1.7 --GI/ General Surgery following, recommendations appreciated  --f/u biospy reults --clear liquid diet, not to advance past clears   HTN BP stable this morning 117/68 Home medications: amlodipine 10 mg daily, HCTZ 25 mg daily, lisinopril 40 mg daily. -Continue home medications   T2DM with peripheral neuropathy Stable  Most recent A1c 8.6 in 2018.Fasting CBG was 209 this morning. For tighter glycemic control prior to surgery, we will add on long-acting insulin and continue to monitor. Home medications include metformin 500 mg twice daily. Atorvastatin 20mg   --Start Lantus 5 units QHS --continue Moderate SSI --Monitor CBG --Continue home gabapentin 600 mg 3 times daily as needed --continue Atorvastatin 20mg  daily   Peripheral edema No LLE edema noted on exam today. Home medications: furosemide 40 mg daily.  --continue holding furosemide for now --Compression stockings as needed   Epilepsy Stable  Home meds: levetiracetam 1000 mg BID --Continue home levetiracetam 1000 mg BID   GERD --Continue protonix 40mg  daily   Possible systolic murmur Possible systolic murmur noted on exam on admission patient states this has been known for 1 month but no work-up has been done so far. No  murmur heard on exam today.  --Consider TTE   FEN/GI: Clear, liquid diet, [employer] feeding supplement  PPx: Heparin 5000u  Status is: Inpatient  Remains inpatient appropriate because:Inpatient level of care appropriate due to severity of illness   Dispo: The patient is from: Home              Anticipated d/c is to: Home              Patient currently is not medically stable to d/c.   Difficult to place patient No        Subjective:  Thomas [name] reports that he is doing well this morning. He reports that he is still his diet and  reports a decrease in appetite. He reports having a normal BM this morning. He has had visits from family and likes to walk the halls. He does not have any complaints this morning.  Objective: Temp:  [98.2 F (36.8 C)-98.9 F (37.2 C)] 98.2 F (36.8 C) (05/02 0452) Pulse Rate:  [83-95] 83 (05/02 0452) Resp:  [16-17] 16 (05/02 0452) BP: (113-136)/(54-68) 117/68 (05/02 0452) SpO2:  [93 %-98 %] 96 % (05/02 0452) Physical Exam: General: Patient laying comfortably in bed. Alert and Conversant Cardiovascular: RRR. No murmurs auscultated .  Respiratory: Normal work of breathing, lungs clear bilaterally, no wheezes, expiratory "whistle" heard throughout all lung fields. Abdomen: Normoactive bowel sounds, mildly distended in LUQ. Non-tender to palpation in all quadrants.  Extremities: No LLE edema.  Neuro: A&O x3. Normal gait.   Laboratory: Recent Labs  Lab 09/06/20 0019 09/07/20 0051 09/08/20 0049  WBC 12.9* 13.8* 12.7*  HGB 11.6* 11.0* 11.6*  HCT 33.3* 32.3* 33.0*  PLT 316 310 301   Recent Labs  Lab 09/04/20 0905 09/05/20 0348 09/07/20 0051 09/08/20 0049  NA  --  136 135 134*  K  --  4.0 3.5 4.1  CL  --  99 99 100  CO2  --  28 26 24   BUN  --  13 12 14   CREATININE  --  1.17 1.20 1.23  CALCIUM  --  9.2 8.9 8.8*  PROT 6.6 7.4  --   --   BILITOT 0.5 0.8  --   --   ALKPHOS 73 82  --   --   ALT 19 21  --   --   AST 13* 14*  --   --    GLUCOSE  --  178* 220* 227*     Imaging/Diagnostic Tests:  Jacklynn Bue, Medical Student 09/08/2020, 7:58 AM MS4, [place] Intern pager: [phone], text pages welcome  Resident Attestation  I saw and evaluated the patient, performing the key elements of the service.I  personally performed or re-performed the history, physical exam, and medical decision making activities of this service and have verified that the service and findings are accurately documented in the student's note. I developed the management plan that is described in the medical student's note, and I agree with the content, with my edits above.    Gifford Shave, PGY2

## 2020-09-09 ENCOUNTER — Other Ambulatory Visit: Payer: Self-pay

## 2020-09-09 ENCOUNTER — Inpatient Hospital Stay (HOSPITAL_COMMUNITY): Payer: Medicare HMO

## 2020-09-09 DIAGNOSIS — R1084 Generalized abdominal pain: Secondary | ICD-10-CM | POA: Diagnosis not present

## 2020-09-09 LAB — CBC WITH DIFFERENTIAL/PLATELET
Abs Immature Granulocytes: 0.08 10*3/uL — ABNORMAL HIGH (ref 0.00–0.07)
Basophils Absolute: 0.1 10*3/uL (ref 0.0–0.1)
Basophils Relative: 1 %
Eosinophils Absolute: 0.6 10*3/uL — ABNORMAL HIGH (ref 0.0–0.5)
Eosinophils Relative: 5 %
HCT: 32.7 % — ABNORMAL LOW (ref 39.0–52.0)
Hemoglobin: 11.4 g/dL — ABNORMAL LOW (ref 13.0–17.0)
Immature Granulocytes: 1 %
Lymphocytes Relative: 19 %
Lymphs Abs: 2.4 10*3/uL (ref 0.7–4.0)
MCH: 25.5 pg — ABNORMAL LOW (ref 26.0–34.0)
MCHC: 34.9 g/dL (ref 30.0–36.0)
MCV: 73.2 fL — ABNORMAL LOW (ref 80.0–100.0)
Monocytes Absolute: 1.1 10*3/uL — ABNORMAL HIGH (ref 0.1–1.0)
Monocytes Relative: 9 %
Neutro Abs: 8.2 10*3/uL — ABNORMAL HIGH (ref 1.7–7.7)
Neutrophils Relative %: 65 %
Platelets: 357 10*3/uL (ref 150–400)
RBC: 4.47 MIL/uL (ref 4.22–5.81)
RDW: 14.6 % (ref 11.5–15.5)
WBC: 12.4 10*3/uL — ABNORMAL HIGH (ref 4.0–10.5)
nRBC: 0 % (ref 0.0–0.2)

## 2020-09-09 LAB — TYPE AND SCREEN
ABO/RH(D): B POS
Antibody Screen: NEGATIVE

## 2020-09-09 LAB — BASIC METABOLIC PANEL
Anion gap: 10 (ref 5–15)
BUN: 19 mg/dL (ref 8–23)
CO2: 25 mmol/L (ref 22–32)
Calcium: 8.9 mg/dL (ref 8.9–10.3)
Chloride: 99 mmol/L (ref 98–111)
Creatinine, Ser: 1.29 mg/dL — ABNORMAL HIGH (ref 0.61–1.24)
GFR, Estimated: >60 mL/min (ref >60)
Glucose, Bld: 232 mg/dL — ABNORMAL HIGH (ref 70–99)
Potassium: 3.5 mmol/L (ref 3.5–5.1)
Sodium: 134 mmol/L — ABNORMAL LOW (ref 135–145)

## 2020-09-09 LAB — GLUCOSE, CAPILLARY
Glucose-Capillary: 200 mg/dL — ABNORMAL HIGH (ref 70–99)
Glucose-Capillary: 189 mg/dL — ABNORMAL HIGH (ref 70–99)
Glucose-Capillary: 145 mg/dL — ABNORMAL HIGH (ref 70–99)
Glucose-Capillary: 183 mg/dL — ABNORMAL HIGH (ref 70–99)

## 2020-09-09 LAB — SURGICAL PATHOLOGY

## 2020-09-09 MED ORDER — LEVETIRACETAM IN NACL 1000 MG/100ML IV SOLN
1000.0000 mg | Freq: Two times a day (BID) | INTRAVENOUS | Status: DC
  Administered 2020-09-10 – 2020-09-14 (×9): 1000 mg via INTRAVENOUS
  Filled 2020-09-09 (×9): qty 100

## 2020-09-09 MED ORDER — LEVETIRACETAM IN NACL 500 MG/100ML IV SOLN: 500.0000 mg | Freq: Two times a day (BID) | INTRAVENOUS | Status: DC

## 2020-09-09 MED ORDER — SODIUM CHLORIDE 0.9 % IV SOLN
2.0000 g | INTRAVENOUS | Status: AC
  Administered 2020-09-10: 2 g via INTRAVENOUS
  Filled 2020-09-09: qty 2

## 2020-09-09 MED ORDER — ACETAMINOPHEN 500 MG PO TABS
1000.0000 mg | ORAL_TABLET | ORAL | Status: AC
  Administered 2020-09-10: 1000 mg via ORAL
  Filled 2020-09-09: qty 2

## 2020-09-09 MED ORDER — BUPIVACAINE LIPOSOME 1.3 % IJ SUSP
20.0000 mL | Freq: Once | INTRAMUSCULAR | Status: DC
  Filled 2020-09-09: qty 20

## 2020-09-09 MED ORDER — ENSURE PRE-SURGERY PO LIQD
592.0000 mL | Freq: Once | ORAL | Status: AC
  Administered 2020-09-09: 592 mL via ORAL
  Filled 2020-09-09: qty 592

## 2020-09-09 MED ORDER — ALVIMOPAN 12 MG PO CAPS
12.0000 mg | ORAL_CAPSULE | ORAL | Status: AC
  Administered 2020-09-10: 12 mg via ORAL
  Filled 2020-09-09 (×2): qty 1

## 2020-09-09 MED ORDER — ENSURE PRE-SURGERY PO LIQD
296.0000 mL | Freq: Once | ORAL | Status: DC
  Filled 2020-09-09: qty 296

## 2020-09-09 MED ORDER — CHLORHEXIDINE GLUCONATE CLOTH 2 % EX PADS
6.0000 | MEDICATED_PAD | Freq: Once | CUTANEOUS | Status: AC
  Administered 2020-09-10: 6 via TOPICAL

## 2020-09-09 NOTE — Plan of Care (Signed)

## 2020-09-09 NOTE — Plan of Care (Signed)
  Problem: Education: Goal: Knowledge of General Education information will improve Description: Including pain rating scale, medication(s)/side effects and non-pharmacologic comfort measures Outcome: Progressing   Problem: Health Behavior/Discharge Planning: Goal: Ability to manage health-related needs will improve Outcome: Progressing   Problem: Clinical Measurements: Goal: Ability to maintain clinical measurements within normal limits will improve Outcome: Progressing   Problem: Clinical Measurements: Goal: Will remain free from infection Outcome: Progressing   Problem: Clinical Measurements: Goal: Diagnostic test results will improve Outcome: Progressing   Problem: Clinical Measurements: Goal: Respiratory complications will improve Outcome: Progressing   Problem: Clinical Measurements: Goal: Cardiovascular complication will be avoided Outcome: Progressing   Problem: Coping: Goal: Level of anxiety will decrease Outcome: Progressing   Problem: Nutrition: Goal: Adequate nutrition will be maintained Outcome: Progressing   Problem: Pain Managment: Goal: General experience of comfort will improve Outcome: Progressing

## 2020-09-09 NOTE — Progress Notes (Signed)
Per surgeon, do not give 0400 ensure. Send ensure down with patient to OR.

## 2020-09-09 NOTE — Progress Notes (Signed)
Inpatient Diabetes Program Recommendations  AACE/ADA: New Consensus Statement on Inpatient Glycemic Control (2015)  Target Ranges:  Prepandial:   less than 140 mg/dL      Peak postprandial:   less than 180 mg/dL (1-2 hours)      Critically ill patients:  140 - 180 mg/dL   Lab Results  Component Value Date   GLUCAP 200 (H) 09/09/2020   HGBA1C 8.6 (H) 08/26/2016    Review of Glycemic Control Results for Thomas [name], Thomas [name] (MRN [id number]) as of 09/09/2020 10:32  Ref. Range 09/08/2020 07:40 09/08/2020 12:19 09/08/2020 17:07 09/08/2020 21:31 09/09/2020 07:31  Glucose-Capillary Latest Ref Range: 70 - 99 mg/dL 209 (H) 209 (H) 212 (H) 138 (H) 200 (H)   Diabetes history: DM 2 Outpatient Diabetes medications: metformin 500 mg bid Current orders for Inpatient glycemic control:  Lantus 5 units qhs Novolog 0-15 units tid + hs  Boost tid between meals  Inpatient Diabetes Program Recommendations:    -  Increase Lantus to 10 units  Thanks,  Tama Headings RN, MSN, BC-ADM Inpatient Diabetes Coordinator Team Pager [phone] (8a-5p)

## 2020-09-09 NOTE — Progress Notes (Signed)
Pt is awake in bed; AOX4. Vitals stable. Sats stable on room air. Denies pain. No distress noted. No SOB noted. Dry cough noted, pt requesting cough syrup. Shift assessment complete. Tenderness noted to mid upper abdomen. Immediate needs addressed. Bed in lowest position with call light within reach and 2 side rails up. Will continue to assess.

## 2020-09-09 NOTE — Progress Notes (Signed)
Progress Note  4 Days Post-Op  Subjective: Patient denies pain this AM. Having bowel function, discussed possibility of further gentle prep and likely OR tomorrow. He is very appreciative of all the care he has received.   Objective: Vital signs in last 24 hours: Temp:  [98.2 F (36.8 C)-98.7 F (37.1 C)] 98.7 F (37.1 C) (05/03 0035) Pulse Rate:  [78-85] 78 (05/03 0035) Resp:  [19-20] 20 (05/03 0035) BP: (130-131)/(67-73) 130/67 (05/03 0035) SpO2:  [97 %] 97 % (05/03 0035) Last BM Date: 09/08/20  Intake/Output from previous day: 05/02 0701 - 05/03 0700 In: 480 [P.O.:480] Out: -  Intake/Output this shift: No intake/output data recorded.  PE: General: pleasant, WD, obese male who is laying in bed in NAD HEENT: head is normocephalic, atraumatic.  Sclera are noninjected.  PERRL.  Ears and nose without any masses or lesions.  Mouth is pink and moist Heart: regular, rate, and rhythm.  Normal s1,s2. No obvious murmurs, gallops, or rubs noted.  Palpable radial and pedal pulses bilaterally Lungs: CTAB, no wheezes, rhonchi, or rales noted.  Respiratory effort nonlabored Abd: soft, NT, ND, +BS, no masses, hernias, or organomegaly MS: all 4 extremities are symmetrical with no cyanosis, clubbing, or edema. Skin: warm and dry with no masses, lesions, or rashes Neuro: Cranial nerves 2-12 grossly intact, sensation is normal throughout Psych: A&Ox3 with an appropriate affect.    Lab Results:  Recent Labs    09/08/20 0049 09/09/20 0111  WBC 12.7* 12.4*  HGB 11.6* 11.4*  HCT 33.0* 32.7*  PLT 301 357   BMET Recent Labs    09/08/20 0049 09/09/20 0111  NA 134* 134*  K 4.1 3.5  CL 100 99  CO2 24 25  GLUCOSE 227* 232*  BUN 14 19  CREATININE 1.23 1.29*  CALCIUM 8.8* 8.9   PT/INR No results for input(s): LABPROT, INR in the last 72 hours. CMP     Component Value Date/Time   NA 134 (L) 09/09/2020 0111   K 3.5 09/09/2020 0111   CL 99 09/09/2020 0111   CO2 25 09/09/2020  0111   GLUCOSE 232 (H) 09/09/2020 0111   BUN 19 09/09/2020 0111   CREATININE 1.29 (H) 09/09/2020 0111   CALCIUM 8.9 09/09/2020 0111   PROT 7.4 09/05/2020 0348   ALBUMIN 3.3 (L) 09/05/2020 0348   AST 14 (L) 09/05/2020 0348   ALT 21 09/05/2020 0348   ALKPHOS 82 09/05/2020 0348   BILITOT 0.8 09/05/2020 0348   GFRNONAA >60 09/09/2020 0111   GFRAA >60 04/01/2017 1921   Lipase     Component Value Date/Time   LIPASE 23 06/14/2015 1020       Studies/Results: No results found.  Anti-infectives: Anti-infectives (From admission, onward)   None       Assessment/Plan MMP including HTN, HLD, GERD, Epilespy, DM2 and Asthma Hx of meningioma s/p craniotomy by Dr. Christella Noa in 2018  Partially obstructing tumor in the transverse colon. - CT scan 4/28 showed:Cannot exclude a 3.7 cm length apple-core type Mass/Carcinoma of the mid transverse colon, although artifact due to large bowel peristalsis is possible. Regional lymph nodes are small but increased since 2011.  - S/p Colonoscopy 4/29 by Dr. Henrene Pastor that showedMalignant completely obstructing tumor in the transverse colon. This also showed one 6 mm polyp in the rectumstatus post polypectomy. Rectal path with tubular adenoma and transverse mass is adenocarcinoma  -CEA 1.7 - Recommend non-contrast chest CT for staging as well - Will plan for extended right hemicolectomy tomorrow  FEN -clears, IVF VTE -SCDs, Heparin subq ID -None currently  LOS: 4 days    Norm Parcel, [facility] 09/09/2020, 10:26 AM Please see Amion for pager number during day hours 7:00am-4:30pm

## 2020-09-09 NOTE — Progress Notes (Addendum)
Family Medicine Teaching Service Daily Progress Note Intern Pager: [phone]  Patient name: Thomas [name] Medical record number: [id number] Date of birth: 1955/07/07 Age: 65 y.o. 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 He will need a CT chest for statging. He is scheduled for a right hemicolectomy for tomorrow with Gen. Surgery. He will be NPO at midnight to prepare for surgery and we switch to IV and/or hold PO medications.  --GI/ General Surgery following, recommendations appreciated  --continue clear liquid diet, not to advance past clears --CT chest w/o contrast --NPO at midnight - Consult Heme/Onc for further treatment management as needed   HTN BP this morning 130/67. We will home PO medications in preparation for surgery tomorrow. We will monitor and manage via IV medications prn.  Home medications: amlodipine 10 mg daily, HCTZ 25 mg daily, lisinopril 40 mg daily. --hold home medications --recommend IV labetalol as needed   T2DM with peripheral neuropathy Stable  Most recent A1c 8.6 in 2018. He was started on Lantus 5 units yesterday. Fasting CBG this morning was 200. Random Glucose CBGs have been in the 220s. We will hold Lantus due to NPO status. We will also hold PO Gabapentin and Atorvastatin.  Home medications include metformin 500 mg twice daily. Atorvastatin 20mg   --hold Lantus 5 units QHS --continue  Moderate SSI --Monitor CBG --hold home gabapentin 600 mg 3 times daily as needed --hold Atorvastatin 20mg  daily   Peripheral edema home medications: furosemide 40 mg daily.  --continue holding furosemide for now --Compression stockings as needed   Epilepsy Stable  Home meds: levetiracetam 1000 mg BID --switch to IV Keppra    GERD --switch to IV Protonix    Possible systolic murmur Possible systolic murmur noted on exam on admission patient states this has been known for 1 month but no work-up has been done so far. No murmur heard on exam today.  --Consider TTE  FEN/GI: Clear, liquid diet, IVF  PPx: Heparin 5000u   Status is: Inpatient  Remains inpatient appropriate because:Inpatient level of care appropriate due to severity of illness   Dispo: The patient is from: Home              Anticipated d/c is to: Home              Patient currently is not medically stable to d/c.   Difficult to place patient No        Subjective:  Thomas [name] reports that he is feeling well this morning and has some mild epigastric discomfort. Family Medicine Team delivered results of biopsy of the mass in his colon. Thomas [name] received the news in good spirits and expressed intuition about a possible malignancy. He inquired about next steps and was informed that GI/ Gen Surgery are also following and we will await their recommendations and work together as his care team. He is still tolerating his diet well.  He says he will deliver the news to his wife and did not have any other questions or concerns  this morning.   Objective: Temp:  [98.2 F (36.8 C)-98.7 F (37.1 C)] 98.7 F (37.1 C) (05/03 0035) Pulse Rate:  [78-85] 78 (05/03 0035) Resp:  [19-20] 20 (05/03 0035) BP: (130-131)/(67-73) 130/67 (05/03 0035) SpO2:  [97 %] 97 % (05/03 0035) Physical Exam: General: Awake, sitting comfortably in chair. Conversant  Cardiovascular: RRR, no murmurs auscultated  Respiratory: Normal WOB, lungs  CTA bilaterally Abdomen: Normoactive bowel sounds. Mildy distended. No tenderness to palpation Extremities: No peripheral edema. DP 2+  Laboratory: Recent Labs  Lab 09/07/20 0051 09/08/20 0049 09/09/20 0111  WBC 13.8* 12.7* 12.4*  HGB 11.0* 11.6* 11.4*  HCT 32.3* 33.0* 32.7*  PLT 310 301 357   Recent Labs  Lab 09/04/20 0905 09/05/20 0348 09/07/20 0051 09/08/20 0049 09/09/20 0111  NA  --  136 135 134* 134*  K  --  4.0 3.5 4.1 3.5  CL  --  99 99 100 99  CO2  --  28 26 24 25   BUN  --  13 12 14 19   CREATININE  --  1.17 1.20 1.23 1.29*  CALCIUM  --  9.2 8.9 8.8* 8.9  PROT 6.6 7.4  --   --   --   BILITOT 0.5 0.8  --   --   --   ALKPHOS 73 82  --   --   --   ALT 19 21  --   --   --   AST 13* 14*  --   --   --   GLUCOSE  --  178* 220* 227* 232*     Imaging/Diagnostic Tests:  Surgical Pathology Report   Clinical History: abnormal abdominal CT (cm)   FINAL MICROSCOPIC DIAGNOSIS:   A. COLON, TRANSVERSE MASS, BIOPSY:  - Invasive adenocarcinoma.   B. RECTUM, POLYPECTOMY:  - Tubular adenoma.  - No high grade dysplasia or malignancy.    Jacklynn Bue, Medical Student 09/09/2020, 7:27 AM MS4, [place] Intern pager: [phone], text pages welcome   FPTS Upper-Level Resident Addendum   I have independently interviewed and examined the patient. I have discussed the above with the original author and agree with their documentation. Please see also any attending notes.   Carollee Leitz MD PGY-1, [place] [facility] 09/09/2020 12:54 PM  FPTS Service pager: [phone] (text pages welcome through [facility])

## 2020-09-10 ENCOUNTER — Inpatient Hospital Stay (HOSPITAL_COMMUNITY): Payer: Medicare HMO | Admitting: Anesthesiology

## 2020-09-10 ENCOUNTER — Encounter (HOSPITAL_COMMUNITY)
Admission: EM | Disposition: A | Discharge status: Home Health | Payer: Self-pay | Source: Home / Self Care | Attending: Family Medicine

## 2020-09-10 ENCOUNTER — Encounter (HOSPITAL_COMMUNITY): Payer: Self-pay | Admitting: Family Medicine

## 2020-09-10 HISTORY — PX: PARTIAL COLECTOMY: SHX5273

## 2020-09-10 LAB — GLUCOSE, CAPILLARY
Glucose-Capillary: 177 mg/dL — ABNORMAL HIGH (ref 70–99)
Glucose-Capillary: 230 mg/dL — ABNORMAL HIGH (ref 70–99)
Glucose-Capillary: 281 mg/dL — ABNORMAL HIGH (ref 70–99)
Glucose-Capillary: 272 mg/dL — ABNORMAL HIGH (ref 70–99)

## 2020-09-10 LAB — CBC WITH DIFFERENTIAL/PLATELET
Abs Immature Granulocytes: 0.06 10*3/uL (ref 0.00–0.07)
Basophils Absolute: 0.1 10*3/uL (ref 0.0–0.1)
Basophils Relative: 1 %
Eosinophils Absolute: 0.5 10*3/uL (ref 0.0–0.5)
Eosinophils Relative: 4 %
HCT: 33.6 % — ABNORMAL LOW (ref 39.0–52.0)
Hemoglobin: 11.4 g/dL — ABNORMAL LOW (ref 13.0–17.0)
Immature Granulocytes: 1 %
Lymphocytes Relative: 20 %
Lymphs Abs: 2.1 10*3/uL (ref 0.7–4.0)
MCH: 25.3 pg — ABNORMAL LOW (ref 26.0–34.0)
MCHC: 33.9 g/dL (ref 30.0–36.0)
MCV: 74.5 fL — ABNORMAL LOW (ref 80.0–100.0)
Monocytes Absolute: 0.9 10*3/uL (ref 0.1–1.0)
Monocytes Relative: 9 %
Neutro Abs: 7.1 10*3/uL (ref 1.7–7.7)
Neutrophils Relative %: 65 %
Platelets: 376 10*3/uL (ref 150–400)
RBC: 4.51 MIL/uL (ref 4.22–5.81)
RDW: 14.6 % (ref 11.5–15.5)
WBC: 10.8 10*3/uL — ABNORMAL HIGH (ref 4.0–10.5)
nRBC: 0 % (ref 0.0–0.2)

## 2020-09-10 LAB — BASIC METABOLIC PANEL
Anion gap: 8 (ref 5–15)
BUN: 20 mg/dL (ref 8–23)
CO2: 27 mmol/L (ref 22–32)
Calcium: 8.8 mg/dL — ABNORMAL LOW (ref 8.9–10.3)
Chloride: 97 mmol/L — ABNORMAL LOW (ref 98–111)
Creatinine, Ser: 1.3 mg/dL — ABNORMAL HIGH (ref 0.61–1.24)
GFR, Estimated: >60 mL/min (ref >60)
Glucose, Bld: 307 mg/dL — ABNORMAL HIGH (ref 70–99)
Potassium: 3.4 mmol/L — ABNORMAL LOW (ref 3.5–5.1)
Sodium: 132 mmol/L — ABNORMAL LOW (ref 135–145)

## 2020-09-10 LAB — SURGICAL PCR SCREEN
MRSA, PCR: NEGATIVE
Staphylococcus aureus: NEGATIVE

## 2020-09-10 SURGERY — COLECTOMY, PARTIAL
Anesthesia: General | Site: Abdomen

## 2020-09-10 MED ORDER — PHENYLEPHRINE HCL-NACL 10-0.9 MG/250ML-% IV SOLN
INTRAVENOUS | Status: DC | PRN
  Administered 2020-09-10: 25 ug/min via INTRAVENOUS

## 2020-09-10 MED ORDER — MIDAZOLAM HCL 5 MG/5ML IJ SOLN
INTRAMUSCULAR | Status: DC | PRN
  Administered 2020-09-10 (×2): 1 mg via INTRAVENOUS

## 2020-09-10 MED ORDER — CHLORHEXIDINE GLUCONATE 0.12 % MT SOLN
15.0000 mL | OROMUCOSAL | Status: AC
  Administered 2020-09-10: 15 mL via OROMUCOSAL
  Filled 2020-09-10: qty 15

## 2020-09-10 MED ORDER — SUGAMMADEX SODIUM 500 MG/5ML IV SOLN
INTRAVENOUS | Status: AC
  Filled 2020-09-10: qty 5

## 2020-09-10 MED ORDER — PANTOPRAZOLE SODIUM 40 MG IV SOLR
40.0000 mg | INTRAVENOUS | Status: DC
  Administered 2020-09-11 – 2020-09-15 (×5): 40 mg via INTRAVENOUS
  Filled 2020-09-10 (×5): qty 40

## 2020-09-10 MED ORDER — METHOCARBAMOL 1000 MG/10ML IJ SOLN
500.0000 mg | Freq: Three times a day (TID) | INTRAVENOUS | Status: DC
  Administered 2020-09-10 – 2020-09-11 (×2): 500 mg via INTRAVENOUS
  Filled 2020-09-10 (×4): qty 5

## 2020-09-10 MED ORDER — ROCURONIUM BROMIDE 10 MG/ML (PF) SYRINGE
PREFILLED_SYRINGE | INTRAVENOUS | Status: AC
  Filled 2020-09-10: qty 10

## 2020-09-10 MED ORDER — LIDOCAINE 2% (20 MG/ML) 5 ML SYRINGE
INTRAMUSCULAR | Status: DC | PRN
  Administered 2020-09-10: 100 mg via INTRAVENOUS

## 2020-09-10 MED ORDER — 0.9 % SODIUM CHLORIDE (POUR BTL) OPTIME
TOPICAL | Status: DC | PRN
  Administered 2020-09-10: 3000 mL

## 2020-09-10 MED ORDER — EPHEDRINE SULFATE-NACL 50-0.9 MG/10ML-% IV SOSY
PREFILLED_SYRINGE | INTRAVENOUS | Status: DC | PRN
  Administered 2020-09-10 (×2): 5 mg via INTRAVENOUS

## 2020-09-10 MED ORDER — FENTANYL CITRATE (PF) 250 MCG/5ML IJ SOLN
INTRAMUSCULAR | Status: DC | PRN
  Administered 2020-09-10: 100 ug via INTRAVENOUS
  Administered 2020-09-10: 25 ug via INTRAVENOUS
  Administered 2020-09-10: 50 ug via INTRAVENOUS
  Administered 2020-09-10: 25 ug via INTRAVENOUS
  Administered 2020-09-10: 50 ug via INTRAVENOUS

## 2020-09-10 MED ORDER — DROPERIDOL 2.5 MG/ML IJ SOLN: 0.6250 mg | Freq: Once | INTRAMUSCULAR | Status: DC | PRN

## 2020-09-10 MED ORDER — PHENYLEPHRINE 40 MCG/ML (10ML) SYRINGE FOR IV PUSH (FOR BLOOD PRESSURE SUPPORT)
PREFILLED_SYRINGE | INTRAVENOUS | Status: AC
  Filled 2020-09-10: qty 20

## 2020-09-10 MED ORDER — PHENYLEPHRINE 40 MCG/ML (10ML) SYRINGE FOR IV PUSH (FOR BLOOD PRESSURE SUPPORT)
PREFILLED_SYRINGE | INTRAVENOUS | Status: DC | PRN
  Administered 2020-09-10: 80 ug via INTRAVENOUS
  Administered 2020-09-10: 120 ug via INTRAVENOUS
  Administered 2020-09-10: 80 ug via INTRAVENOUS

## 2020-09-10 MED ORDER — ROCURONIUM BROMIDE 10 MG/ML (PF) SYRINGE
PREFILLED_SYRINGE | INTRAVENOUS | Status: AC
  Filled 2020-09-10: qty 20

## 2020-09-10 MED ORDER — LACTATED RINGERS IV SOLN: INTRAVENOUS | Status: DC

## 2020-09-10 MED ORDER — SUGAMMADEX SODIUM 500 MG/5ML IV SOLN
INTRAVENOUS | Status: DC | PRN
  Administered 2020-09-10: 300 mg via INTRAVENOUS

## 2020-09-10 MED ORDER — EPHEDRINE 5 MG/ML INJ
INTRAVENOUS | Status: AC
  Filled 2020-09-10: qty 20

## 2020-09-10 MED ORDER — HYDROMORPHONE HCL 1 MG/ML IJ SOLN
0.5000 mg | INTRAMUSCULAR | Status: DC | PRN
  Administered 2020-09-10: 1 mg via INTRAVENOUS
  Administered 2020-09-10: 0.5 mg via INTRAVENOUS
  Administered 2020-09-11 – 2020-09-13 (×19): 1 mg via INTRAVENOUS
  Filled 2020-09-10 (×6): qty 1
  Filled 2020-09-10: qty 0.5
  Filled 2020-09-10 (×14): qty 1

## 2020-09-10 MED ORDER — PROPOFOL 10 MG/ML IV BOLUS
INTRAVENOUS | Status: AC
  Filled 2020-09-10: qty 20

## 2020-09-10 MED ORDER — EPHEDRINE SULFATE 50 MG/ML IJ SOLN: INTRAMUSCULAR | Status: DC | PRN

## 2020-09-10 MED ORDER — MIDAZOLAM HCL 2 MG/2ML IJ SOLN
INTRAMUSCULAR | Status: AC
  Filled 2020-09-10: qty 2

## 2020-09-10 MED ORDER — ALVIMOPAN 12 MG PO CAPS
12.0000 mg | ORAL_CAPSULE | Freq: Two times a day (BID) | ORAL | Status: DC
  Administered 2020-09-11 – 2020-09-15 (×8): 12 mg via ORAL
  Filled 2020-09-10 (×10): qty 1

## 2020-09-10 MED ORDER — OXYCODONE HCL 5 MG/5ML PO SOLN: 5.0000 mg | Freq: Once | ORAL | Status: AC | PRN

## 2020-09-10 MED ORDER — FENTANYL CITRATE (PF) 100 MCG/2ML IJ SOLN
25.0000 ug | INTRAMUSCULAR | Status: DC | PRN
  Administered 2020-09-10 (×2): 50 ug via INTRAVENOUS

## 2020-09-10 MED ORDER — SUCCINYLCHOLINE CHLORIDE 200 MG/10ML IV SOSY
PREFILLED_SYRINGE | INTRAVENOUS | Status: AC
  Filled 2020-09-10: qty 20

## 2020-09-10 MED ORDER — HEPARIN SODIUM (PORCINE) 5000 UNIT/ML IJ SOLN
5000.0000 [IU] | Freq: Three times a day (TID) | INTRAMUSCULAR | Status: DC
  Administered 2020-09-11 – 2020-09-17 (×19): 5000 [IU] via SUBCUTANEOUS
  Filled 2020-09-10 (×19): qty 1

## 2020-09-10 MED ORDER — ACETAMINOPHEN 325 MG PO TABS
650.0000 mg | ORAL_TABLET | Freq: Four times a day (QID) | ORAL | Status: DC
  Filled 2020-09-10: qty 2

## 2020-09-10 MED ORDER — ALBUTEROL SULFATE HFA 108 (90 BASE) MCG/ACT IN AERS
INHALATION_SPRAY | RESPIRATORY_TRACT | Status: DC | PRN
  Administered 2020-09-10 (×2): 5 via RESPIRATORY_TRACT

## 2020-09-10 MED ORDER — OXYCODONE HCL 5 MG PO TABS
ORAL_TABLET | ORAL | Status: AC
  Administered 2020-09-10: 5 mg via ORAL
  Filled 2020-09-10: qty 1

## 2020-09-10 MED ORDER — LACTATED RINGERS IV SOLN: INTRAVENOUS | Status: DC | PRN

## 2020-09-10 MED ORDER — LIDOCAINE 2% (20 MG/ML) 5 ML SYRINGE
INTRAMUSCULAR | Status: AC
  Filled 2020-09-10: qty 10

## 2020-09-10 MED ORDER — PROMETHAZINE HCL 25 MG/ML IJ SOLN: 6.2500 mg | INTRAMUSCULAR | Status: DC | PRN

## 2020-09-10 MED ORDER — OXYCODONE HCL 5 MG PO TABS: 5.0000 mg | ORAL_TABLET | Freq: Once | ORAL | Status: AC | PRN

## 2020-09-10 MED ORDER — ALBUTEROL SULFATE HFA 108 (90 BASE) MCG/ACT IN AERS
INHALATION_SPRAY | RESPIRATORY_TRACT | Status: AC
  Filled 2020-09-10: qty 13.4

## 2020-09-10 MED ORDER — PROPOFOL 10 MG/ML IV BOLUS
INTRAVENOUS | Status: DC | PRN
  Administered 2020-09-10: 150 mg via INTRAVENOUS
  Administered 2020-09-10: 30 mg via INTRAVENOUS
  Administered 2020-09-10: 20 mg via INTRAVENOUS

## 2020-09-10 MED ORDER — DEXAMETHASONE SODIUM PHOSPHATE 10 MG/ML IJ SOLN
INTRAMUSCULAR | Status: AC
  Filled 2020-09-10: qty 4

## 2020-09-10 MED ORDER — DEXAMETHASONE SODIUM PHOSPHATE 10 MG/ML IJ SOLN
INTRAMUSCULAR | Status: DC | PRN
  Administered 2020-09-10: 5 mg via INTRAVENOUS

## 2020-09-10 MED ORDER — ROCURONIUM BROMIDE 10 MG/ML (PF) SYRINGE
PREFILLED_SYRINGE | INTRAVENOUS | Status: DC | PRN
  Administered 2020-09-10: 80 mg via INTRAVENOUS
  Administered 2020-09-10: 20 mg via INTRAVENOUS
  Administered 2020-09-10: 30 mg via INTRAVENOUS

## 2020-09-10 MED ORDER — FENTANYL CITRATE (PF) 100 MCG/2ML IJ SOLN
INTRAMUSCULAR | Status: AC
  Administered 2020-09-10: 50 ug via INTRAVENOUS
  Filled 2020-09-10: qty 2

## 2020-09-10 MED ORDER — FENTANYL CITRATE (PF) 250 MCG/5ML IJ SOLN
INTRAMUSCULAR | Status: AC
  Filled 2020-09-10: qty 5

## 2020-09-10 MED ORDER — EPHEDRINE 5 MG/ML INJ
INTRAVENOUS | Status: AC
  Filled 2020-09-10: qty 10

## 2020-09-10 MED ORDER — ONDANSETRON HCL 4 MG/2ML IJ SOLN
INTRAMUSCULAR | Status: DC | PRN
  Administered 2020-09-10: 4 mg via INTRAVENOUS

## 2020-09-10 MED ORDER — FENTANYL CITRATE (PF) 100 MCG/2ML IJ SOLN
INTRAMUSCULAR | Status: AC
  Filled 2020-09-10: qty 2

## 2020-09-10 SURGICAL SUPPLY — 46 items
BLADE CLIPPER SURG (BLADE) ×1 IMPLANT
CANISTER SUCT 3000ML PPV (MISCELLANEOUS) ×2 IMPLANT
COVER SURGICAL LIGHT HANDLE (MISCELLANEOUS) ×2 IMPLANT
COVER TRANSDUCER ULTRASND GEL (DISPOSABLE) ×1 IMPLANT
COVER WAND RF STERILE (DRAPES) ×2 IMPLANT
DRSG OPSITE POSTOP 4X10 (GAUZE/BANDAGES/DRESSINGS) ×1 IMPLANT
DRSG OPSITE POSTOP 4X12 (GAUZE/BANDAGES/DRESSINGS) ×1 IMPLANT
DRSG OPSITE POSTOP 4X8 (GAUZE/BANDAGES/DRESSINGS) IMPLANT
ELECT CAUTERY BLADE 6.4 (BLADE) ×2 IMPLANT
ELECT REM PT RETURN 9FT ADLT (ELECTROSURGICAL) ×2
ELECTRODE REM PT RTRN 9FT ADLT (ELECTROSURGICAL) ×1 IMPLANT
GLOVE BIO SURGEON STRL SZ8 (GLOVE) ×4 IMPLANT
GLOVE SRG 8 PF TXTR STRL LF DI (GLOVE) ×2 IMPLANT
GLOVE SURG UNDER POLY LF SZ8 (GLOVE) ×4
GOWN STRL REUS W/ TWL LRG LVL3 (GOWN DISPOSABLE) ×4 IMPLANT
GOWN STRL REUS W/ TWL XL LVL3 (GOWN DISPOSABLE) ×2 IMPLANT
GOWN STRL REUS W/TWL LRG LVL3 (GOWN DISPOSABLE) ×8
GOWN STRL REUS W/TWL XL LVL3 (GOWN DISPOSABLE) ×4
KIT SIGMOIDOSCOPE (SET/KITS/TRAYS/PACK) IMPLANT
KIT TURNOVER KIT B (KITS) ×2 IMPLANT
LIGASURE IMPACT 36 18CM CVD LR (INSTRUMENTS) ×1 IMPLANT
NS IRRIG 1000ML POUR BTL (IV SOLUTION) ×5 IMPLANT
PACK COLON (CUSTOM PROCEDURE TRAY) ×2 IMPLANT
PAD ARMBOARD 7.5X6 YLW CONV (MISCELLANEOUS) ×4 IMPLANT
PENCIL SMOKE EVACUATOR (MISCELLANEOUS) ×2 IMPLANT
RELOAD PROXIMATE 75MM BLUE (ENDOMECHANICALS) ×2 IMPLANT
RELOAD STAPLE 75 3.8 BLU REG (ENDOMECHANICALS) IMPLANT
RETAINER VISCERA MED (MISCELLANEOUS) ×1 IMPLANT
SEALER TISSUE X1 CVD JAW (INSTRUMENTS) ×1 IMPLANT
SPECIMEN JAR X LARGE (MISCELLANEOUS) ×2 IMPLANT
SPONGE LAP 18X18 RF (DISPOSABLE) ×2 IMPLANT
STAPLER PROXIMATE 75MM BLUE (STAPLE) ×1 IMPLANT
STAPLER VISISTAT 35W (STAPLE) ×2 IMPLANT
SURGILUBE 2OZ TUBE FLIPTOP (MISCELLANEOUS) IMPLANT
SUT PDS AB 1 TP1 96 (SUTURE) ×4 IMPLANT
SUT PROLENE 2 0 CT2 30 (SUTURE) IMPLANT
SUT PROLENE 2 0 KS (SUTURE) IMPLANT
SUT SILK 0 SH 30 (SUTURE) ×3 IMPLANT
SUT SILK 2 0 SH CR/8 (SUTURE) ×2 IMPLANT
SUT SILK 2 0 TIES 10X30 (SUTURE) ×2 IMPLANT
SUT SILK 3 0 SH CR/8 (SUTURE) ×2 IMPLANT
SUT SILK 3 0 TIES 10X30 (SUTURE) ×2 IMPLANT
SUT VIC AB 3-0 SH 18 (SUTURE) ×2 IMPLANT
TRAY FOLEY MTR SLVR 14FR STAT (SET/KITS/TRAYS/PACK) ×2 IMPLANT
TUBE CONNECTING 12X1/4 (SUCTIONS) ×3 IMPLANT
UNDERPAD 30X36 HEAVY ABSORB (UNDERPADS AND DIAPERS) IMPLANT

## 2020-09-10 NOTE — Progress Notes (Signed)
Report called to [place], RN in [place].

## 2020-09-10 NOTE — Plan of Care (Signed)
  Problem: Education: Goal: Knowledge of General Education information will improve Description: Including pain rating scale, medication(s)/side effects and non-pharmacologic comfort measures Outcome: Progressing   Problem: Health Behavior/Discharge Planning: Goal: Ability to manage health-related needs will improve Outcome: Progressing   Problem: Clinical Measurements: Goal: Ability to maintain clinical measurements within normal limits will improve Outcome: Progressing Goal: Will remain free from infection Outcome: Progressing Goal: Diagnostic test results will improve Outcome: Progressing Goal: Respiratory complications will improve Outcome: Progressing Goal: Cardiovascular complication will be avoided Outcome: Progressing   Problem: Nutrition: Goal: Adequate nutrition will be maintained Outcome: Progressing   Problem: Pain Managment: Goal: General experience of comfort will improve Outcome: Progressing   Problem: Elimination: Goal: Will not experience complications related to bowel motility Outcome: Progressing Goal: Will not experience complications related to urinary retention Outcome: Progressing   Problem: Safety: Goal: Ability to remain free from injury will improve Outcome: Progressing

## 2020-09-10 NOTE — Progress Notes (Signed)
General Surgery Follow Up Note  Subjective:    Overnight Issues:   Objective:  Vital signs for last 24 hours: Temp:  [97.8 F (36.6 C)-98.4 F (36.9 C)] 98 F (36.7 C) (05/04 0601) Pulse Rate:  [81-84] 81 (05/04 0601) Resp:  [15-20] 15 (05/04 0601) BP: (112-131)/(66-75) 124/66 (05/04 0601) SpO2:  [96 %-97 %] 97 % (05/04 0601) Weight:  [138.4 kg] 138.4 kg (05/04 0932)  Hemodynamic parameters for last 24 hours:    Intake/Output from previous day: 05/03 0701 - 05/04 0700 In: 720 [P.O.:720] Out: -   Intake/Output this shift: No intake/output data recorded.  Vent settings for last 24 hours:    Physical Exam:  Gen: comfortable, no distress Neuro: non-focal exam HEENT: PERRL Neck: supple CV: RRR Pulm: unlabored breathing Abd: soft, NT GU: clear yellow urine Extr: wwp, no edema   Results for orders placed or performed during the hospital encounter of 09/04/20 (from the past 24 hour(s))  Glucose, capillary     Status: Abnormal   Collection Time: 09/09/20 12:19 PM  Result Value Ref Range   Glucose-Capillary 189 (H) 70 - 99 mg/dL  Type and screen [place]     Status: None   Collection Time: 09/09/20  3:23 PM  Result Value Ref Range   ABO/RH(D) B POS    Antibody Screen NEG    Sample Expiration      09/12/2020,2359 Performed at [facility], [place] [address] [place], [place] [address]   Glucose, capillary     Status: Abnormal   Collection Time: 09/09/20  4:54 PM  Result Value Ref Range   Glucose-Capillary 145 (H) 70 - 99 mg/dL  Glucose, capillary     Status: Abnormal   Collection Time: 09/09/20  8:44 PM  Result Value Ref Range   Glucose-Capillary 183 (H) 70 - 99 mg/dL  Basic metabolic panel     Status: Abnormal   Collection Time: 09/10/20 12:34 AM  Result Value Ref Range   Sodium 132 (L) 135 - 145 mmol/L   Potassium 3.4 (L) 3.5 - 5.1 mmol/L   Chloride 97 (L) 98 - 111 mmol/L   CO2 27 22 - 32 mmol/L   Glucose, Bld 307 (H) 70 - 99  mg/dL   BUN 20 8 - 23 mg/dL   Creatinine, Ser 1.30 (H) 0.61 - 1.24 mg/dL   Calcium 8.8 (L) 8.9 - 10.3 mg/dL   GFR, Estimated >60 >60 mL/min   Anion gap 8 5 - 15  CBC WITH DIFFERENTIAL     Status: Abnormal   Collection Time: 09/10/20 12:34 AM  Result Value Ref Range   WBC 10.8 (H) 4.0 - 10.5 K/uL   RBC 4.51 4.22 - 5.81 MIL/uL   Hemoglobin 11.4 (L) 13.0 - 17.0 g/dL   HCT 33.6 (L) 39.0 - 52.0 %   MCV 74.5 (L) 80.0 - 100.0 fL   MCH 25.3 (L) 26.0 - 34.0 pg   MCHC 33.9 30.0 - 36.0 g/dL   RDW 14.6 11.5 - 15.5 %   Platelets 376 150 - 400 K/uL   nRBC 0.0 0.0 - 0.2 %   Neutrophils Relative % 65 %   Neutro Abs 7.1 1.7 - 7.7 K/uL   Lymphocytes Relative 20 %   Lymphs Abs 2.1 0.7 - 4.0 K/uL   Monocytes Relative 9 %   Monocytes Absolute 0.9 0.1 - 1.0 K/uL   Eosinophils Relative 4 %   Eosinophils Absolute 0.5 0.0 - 0.5 K/uL  Basophils Relative 1 %   Basophils Absolute 0.1 0.0 - 0.1 K/uL   Immature Granulocytes 1 %   Abs Immature Granulocytes 0.06 0.00 - 0.07 K/uL  Glucose, capillary     Status: Abnormal   Collection Time: 09/10/20  8:18 AM  Result Value Ref Range   Glucose-Capillary 177 (H) 70 - 99 mg/dL    Assessment & Plan:  Present on Admission: . Abdominal pain    LOS: 5 days   Additional comments:I reviewed the patient's new clinical lab test results.    and I reviewed the patients new imaging test results.     Partiallyobstructing tumor in the transverse colon. - CT scan 4/28 showed:Cannot exclude a 3.7 cm length apple-core type Mass/Carcinoma of the mid transverse colon, although artifact due to large bowel peristalsis is possible. Regional lymph nodes are small but increased since 2011.  - S/p Colonoscopy 4/29 by Dr. Henrene Pastor that showedMalignant completely obstructing tumor in the transverse colon. This also showed one 6 mm polyp in the rectumstatus post polypectomy. Rectal path with tubular adenoma and transverse mass is adenocarcinoma  -CEA 1.7 - 5/3 non-contrast  chest CT for staging as well - extended right hemicolectomy today. Informed consent provided by patient  FEN -npo VTE -SCDs, Heparin subq ID -None currently  LOS: 4 days   Thomas Oka, MD Trauma & General Surgery Please use [email or web address] to contact on call provider  09/10/2020  *Care during the described time interval was provided by me. I have reviewed this patient's available data, including medical history, events of note, physical examination and test results as part of my evaluation.

## 2020-09-10 NOTE — Anesthesia Postprocedure Evaluation (Signed)
Anesthesia Post Note  Patient: Thomas [name]  Procedure(s) Performed: TRANSVERSE COLECTOMY (N/A Abdomen)     Patient location during evaluation: PACU Anesthesia Type: General Level of consciousness: awake and alert Pain management: pain level controlled Vital Signs Assessment: post-procedure vital signs reviewed and stable Respiratory status: spontaneous breathing, nonlabored ventilation and respiratory function stable Cardiovascular status: blood pressure returned to baseline and stable Postop Assessment: no apparent nausea or vomiting Anesthetic complications: no   No complications documented.  Last Vitals:  Vitals:   09/10/20 0601 09/10/20 1311  BP: 124/66 116/64  Pulse: 81 73  Resp: 15 15  Temp: 36.7 C 36.4 C  SpO2: 97% 100%    Last Pain:  Vitals:   09/10/20 1311  TempSrc:   PainSc: 10-Worst pain ever                 [name]

## 2020-09-10 NOTE — Addendum Note (Signed)
Addendum  created 09/10/20 1446 by [name], MD   Clinical Note Signed

## 2020-09-10 NOTE — Anesthesia Procedure Notes (Signed)
Procedure Name: Intubation Date/Time: 09/10/2020 10:26 AM Performed by: Harden Mo, CRNA Pre-anesthesia Checklist: Patient identified, Emergency Drugs available, Suction available and Patient being monitored Patient Re-evaluated:Patient Re-evaluated prior to induction Oxygen Delivery Method: Circle System Utilized Preoxygenation: Pre-oxygenation with 100% oxygen Induction Type: IV induction Ventilation: Mask ventilation without difficulty and Oral airway inserted - appropriate to patient size Laryngoscope Size: Sabra Heck and 2 Grade View: Grade I Tube type: Oral Tube size: 7.5 mm Number of attempts: 1 Airway Equipment and Method: Stylet and Oral airway Placement Confirmation: ETT inserted through vocal cords under direct vision,  positive ETCO2 and breath sounds checked- equal and bilateral Secured at: 23 cm Tube secured with: Tape Dental Injury: Teeth and Oropharynx as per pre-operative assessment

## 2020-09-10 NOTE — Anesthesia Postprocedure Evaluation (Signed)
Anesthesia Post Note  Patient: Thomas [name]  Procedure(s) Performed: TRANSVERSE COLECTOMY (N/A Abdomen)     Anesthesia Type: General Anesthetic complications: no Comments: PACU RN called to tell me an oxycodone pill is stuck in the patient's throat. He is able to swallow and control secretions easily. Patient states it is an irritating feeling. He has tried liquids to get it to pass that has been unsuccessful. He is unable to have solids due to the nature of his surgery. I advised she let dr. Bobbye Morton know and to continue to try to get it to pass with liquids. Hopefully, it will will pass or dissolve. Dr. Bobbye Morton can decide if it needs procedural intervention. Norton Blizzard, MD     No complications documented.  Last Vitals:  Vitals:   09/10/20 1357 09/10/20 1412  BP: (!) 142/74 (!) 144/69  Pulse: 80 91  Resp: 19 20  Temp:    SpO2: 97% 97%    Last Pain:  Vitals:   09/10/20 1357  TempSrc:   PainSc: 7                  Candra R Veeda Virgo

## 2020-09-10 NOTE — Transfer of Care (Signed)
Immediate Anesthesia Transfer of Care Note  Patient: Thomas [name]  Procedure(s) Performed: TRANSVERSE COLECTOMY (N/A Abdomen)  Patient Location: PACU  Anesthesia Type:General  Level of Consciousness: awake and alert   Airway & Oxygen Therapy: Patient Spontanous Breathing and Patient connected to nasal cannula oxygen  Post-op Assessment: Report given to RN, Post -op Vital signs reviewed and stable and Patient moving all extremities X 4  Post vital signs: Reviewed and stable  Last Vitals:  Vitals Value Taken Time  BP    Temp    Pulse    Resp    SpO2      Last Pain:  Vitals:   09/10/20 0900  TempSrc:   PainSc: 0-No pain      Patients Stated Pain Goal: 0 (41/44/36 0165)  Complications: No complications documented.

## 2020-09-10 NOTE — Progress Notes (Signed)
Patient is back from OR. Sleepy but alert and oriented x4. VSS. Still c/o pill stuck in throat but able to swallow and no SOB. NGT to low int suction with bile colored output. Colostomy to RUQ C/D/I with minimal output. Midline honeycomb dressing has some sanguinous drainage noted and was remarked as it has moved outside of the previous markings. IVF per [facility]. Will continue to monitor.

## 2020-09-10 NOTE — Anesthesia Preprocedure Evaluation (Addendum)
Anesthesia Evaluation  Patient identified by MRN, date of birth, ID band Patient awake    Reviewed: Allergy & Precautions, NPO status , Patient's Chart, lab work & pertinent test results  Airway Mallampati: III  TM Distance: >3 FB Neck ROM: Full    Dental  (+) Poor Dentition, Missing Many missing teeth and remaining teeth in poor condition:   Pulmonary asthma , sleep apnea , former smoker,    breath sounds clear to auscultation       Cardiovascular Exercise Tolerance: Good METS: 3 - Mets hypertension, Pt. on medications Normal cardiovascular exam Rhythm:Regular Rate:Normal  EKG NSR 09/04/20   Neuro/Psych  Headaches, Seizures - (on Keppra), Well Controlled,  PSYCHIATRIC DISORDERS H/o frontal brain tumor  Neuromuscular disease CVA    GI/Hepatic GERD  ,  Endo/Other  diabetes, Type 2, Oral Hypoglycemic AgentsMorbid obesity (bmi 46)  Renal/GU      Musculoskeletal negative musculoskeletal ROS (+)   Abdominal (+) + obese,   Peds  Hematology negative hematology ROS (+)   Anesthesia Other Findings   Reproductive/Obstetrics negative OB ROS                            Anesthesia Physical Anesthesia Plan  ASA: III  Anesthesia Plan: General   Post-op Pain Management:    Induction:   PONV Risk Score and Plan: 2 and Midazolam, Treatment may vary due to age or medical condition, Ondansetron and Dexamethasone  Airway Management Planned: Oral ETT  Additional Equipment: None  Intra-op Plan:   Post-operative Plan: Extubation in OR  Informed Consent: I have reviewed the patients History and Physical, chart, labs and discussed the procedure including the risks, benefits and alternatives for the proposed anesthesia with the patient or authorized representative who has indicated his/her understanding and acceptance.     Dental advisory given  Plan Discussed with: CRNA and  Anesthesiologist  Anesthesia Plan Comments: (Patient states none of his teeth are loose despite them being in poor condition. Advised they are at risk of being injured or becoming dislodged in their current state. He expressed understanding and agreed to proceed. Norton Blizzard, MD  )       Anesthesia Quick Evaluation

## 2020-09-10 NOTE — Progress Notes (Addendum)
Family Medicine Teaching Service Daily Progress Note Intern Pager: [phone]  Patient name: Thomas [name] Medical record number: [id number] Date of birth: 04-Nov-1955 Age: 65 y.o. 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 He is scheduled for a right hemicolectomy for this morning with Gen. Surgery.  We consulted oncology for their recommendations and they reported that they would see him prior to discharge and set up outpatient management pending what was discovered in the operation.. --GI/ General Surgery following, recommendations appreciated  --Consult Heme/Onc for further treatment management as needed  HTN BP this morning 124/66. Home medications were held overnight due to NPO status for surgery.We will monitor and manage via IV medications prn.  Home medications:amlodipine 10 mg daily, HCTZ 25 mg daily, lisinopril 40 mg daily. --hold home medications --recommend IV labetalol as needed  T2DM with peripheral neuropathy Stable Glucose on CMP was 307 this morning. Lantus was 5 units was held overnight due to NPO status for surgery today. PO Gabapentin and Atorvastatin were also held.  Home medications include metformin 500 mg twice daily.Atorvastatin 20mg  --continue hold Lantus 5 units QHS --continueModerate SSI --Monitor CBG --hold homegabapentin 600  mg 3 times daily as needed --hold Atorvastatin 20mg  daily  Peripheral edema home medications:furosemide 40 mg daily.  --continue holdingfurosemide for now --Compression stockingsas needed  Epilepsy Stable Homemeds:levetiracetam 1000 mg BID --switch to IV Keppra for surgery  GERD --switch to IV Protonix   Possible systolic murmur Possible systolic murmur noted on exam on admissionpatient states this has been known for 1 month but no work-up has been done so far.No murmur heard on exam today. --Consider TTE  FEN/GI: NPO PPx: none   Status is: Inpatient  Remains inpatient appropriate because:Inpatient level of care appropriate due to severity of illness   Dispo: The patient is from: Home              Anticipated d/c is to: Home              Patient currently is not medically stable to d/c.   Difficult to place patient No  Subjective:  [name] reports that he is doing well this morning before his surgery. He states that his surgeon, Dr. Bobbye [name] answered all of his questions regarding this morning's procedure. He denies any abdominal pain, and has no complaints this morning.   Objective: Temp:  [97.8 F (36.6 C)-98.4 F (36.9 C)] 98 F (36.7 C) (05/04 0601) Pulse Rate:  [81-84] 81 (05/04 0601) Resp:  [15-20] 15 (05/04 0601) BP: (112-131)/(66-75) 124/66 (05/04 0601) SpO2:  [96 %-97 %] 97 % (05/04 0601) Physical Exam: General: Sitting up in chair comfortably. Cardiovascular: regular rate and rhythm. No murmurs auscultated Respiratory: normal work of breathing, lungs clear bilaterally Abdomen: minimal bowel sounds. No tenderness Extremities: no peripheral edema  Laboratory: Recent Labs  Lab 09/08/20 0049 09/09/20 0111 09/10/20 0034  WBC 12.7* 12.4* 10.8*  HGB 11.6* 11.4* 11.4*  HCT 33.0* 32.7* 33.6*  PLT 301 357  [place]  Lab 09/04/20 0905 09/05/20 0348 09/07/20 0051 09/08/20 0049 09/09/20 0111 09/10/20 0034  NA  --  136   < >  134* 134* 132*  K  --  4.0   < > 4.1 3.5 3.4*  CL  --  99   < > 100 99 97*  CO2  --  28   < > 24 25 27   BUN  --  13   < > 14 19 20   CREATININE  --  1.17   < > 1.23 1.29* 1.30*  CALCIUM  --  9.2   < > 8.8* 8.9 8.8*  PROT 6.6 7.4  --   --   --   --   BILITOT 0.5 0.8  --   --   --   --   ALKPHOS 73 82  --   --   --   --   ALT 19 21  --   --   --   --   AST 13* 14*  --   --   --   --   GLUCOSE  --  178*   < > 227* 232* 307*   < > = values in this interval not displayed.   Imaging/Diagnostic Tests:  CT Chest w/o contrast (09/09/20)  IMPRESSION: 1. No specific findings of metastatic disease to the chest. 2. A 3 by 2 mm subpleural nodule in the right upper lobe along the major fissure is highly likely to be a benign subpleural lymph node but may warrant surveillance. 3.  Aortic Atherosclerosis (ICD10-I70.0).  Coronary atherosclerosis. 4. Airway thickening is present, suggesting bronchitis or reactive airways disease  Thomas [name], Medical Student 09/10/2020, 7:27 AM MS4, [place] Intern pager: [phone], text pages welcome  Resident Attestation  I saw and evaluated the patient, performing the key elements of the service.I  personally performed or re-performed the history, physical exam, and medical decision making activities of this service and have verified that the service and findings are accurately documented in the student's note. I developed the management plan that is described in the medical student's note, and I agree with the content, with my edits above.    Thomas [name], PGY2

## 2020-09-10 NOTE — Op Note (Signed)
   Operative Note   Date: 09/10/2020  Procedure: exploratory laparotomy, mobilization of the splenic flexure, transverse colectomy and end colostomy  Pre-op diagnosis: near obstructing invasive adenocarcinoma of the transverse colon Post-op diagnosis: same, incarcerated umbilical hernia  Indication and clinical history: The patient is a 65 y.o. year old male with near obstructing invasive adenocarcinoma of the transverse colon  Surgeon: [name], MD Assistant: Zenia Resides, MD  Anesthesiologist: Elgie Congo, MD Anesthesia: General  Findings:  Specimen: transverse colon EBL: 50cc Drains/Implants: none  Disposition: PACU - hemodynamically stable.  Description of procedure: The patient was positioned supine on the operating room table. General anesthetic induction and intubation were uneventful. Foley catheter insertion was performed and was atraumatic. Time-out was performed verifying correct patient, procedure, signature of informed consent, and administration of pre-operative antibiotics. The abdomen was prepped and draped in the usual sterile fashion.  A midline incision was made and deepened through the fascia into the peritoneal cavity. The adhesions of the umbilical hernia were resected. The mass was identified just proximal to the colonoscopic tattoo mark. The hepatic and splenic flexures were mobilized. The middle colic was identified, isolated and ligated with 2-0 silk suture and a zero silk suture ligation. The colon was transected more than 5cm proximally and distally to the palpable mass. The mesentery was transected using an Enseal device. The specimen was oriented and sent to pathology. The distal colon was marked with a 2-0 silk suture. A colostomy site in the left upper quadrant was identified and created. The colon was passed through this site. The abdomen was irrigated with two liters of warm saline  and suctioned. Hemostasis was confirmed. The fascia was closed with #1 looped PDS  suture in a running fashion. The skin was closed with staples. Sterile dressings were applied. The ostomy was then matured and an ostomy appliance placed over it.   All sponge and instrument counts were correct at the conclusion of the procedure. The patient was awakened from anesthesia, extubated uneventfully, and transported to the PACU in good condition. There were no complications.    [name], MD General and [facility]

## 2020-09-11 ENCOUNTER — Encounter (HOSPITAL_COMMUNITY): Payer: Self-pay | Admitting: Surgery

## 2020-09-11 DIAGNOSIS — Z8673 Personal history of transient ischemic attack (TIA), and cerebral infarction without residual deficits: Secondary | ICD-10-CM

## 2020-09-11 DIAGNOSIS — C184 Malignant neoplasm of transverse colon: Secondary | ICD-10-CM | POA: Diagnosis not present

## 2020-09-11 DIAGNOSIS — Z933 Colostomy status: Secondary | ICD-10-CM

## 2020-09-11 LAB — BASIC METABOLIC PANEL
Anion gap: 8 (ref 5–15)
BUN: 19 mg/dL (ref 8–23)
CO2: 26 mmol/L (ref 22–32)
Calcium: 8.5 mg/dL — ABNORMAL LOW (ref 8.9–10.3)
Chloride: 100 mmol/L (ref 98–111)
Creatinine, Ser: 1.55 mg/dL — ABNORMAL HIGH (ref 0.61–1.24)
GFR, Estimated: 50 mL/min — ABNORMAL LOW (ref >60)
Glucose, Bld: 260 mg/dL — ABNORMAL HIGH (ref 70–99)
Potassium: 4.7 mmol/L (ref 3.5–5.1)
Sodium: 134 mmol/L — ABNORMAL LOW (ref 135–145)

## 2020-09-11 LAB — CBC
HCT: 34.2 % — ABNORMAL LOW (ref 39.0–52.0)
Hemoglobin: 11.6 g/dL — ABNORMAL LOW (ref 13.0–17.0)
MCH: 25.6 pg — ABNORMAL LOW (ref 26.0–34.0)
MCHC: 33.9 g/dL (ref 30.0–36.0)
MCV: 75.5 fL — ABNORMAL LOW (ref 80.0–100.0)
Platelets: 414 10*3/uL — ABNORMAL HIGH (ref 150–400)
RBC: 4.53 MIL/uL (ref 4.22–5.81)
RDW: 14.8 % (ref 11.5–15.5)
WBC: 21.2 10*3/uL — ABNORMAL HIGH (ref 4.0–10.5)
nRBC: 0 % (ref 0.0–0.2)

## 2020-09-11 LAB — GLUCOSE, CAPILLARY
Glucose-Capillary: 277 mg/dL — ABNORMAL HIGH (ref 70–99)
Glucose-Capillary: 225 mg/dL — ABNORMAL HIGH (ref 70–99)
Glucose-Capillary: 215 mg/dL — ABNORMAL HIGH (ref 70–99)
Glucose-Capillary: 188 mg/dL — ABNORMAL HIGH (ref 70–99)

## 2020-09-11 MED ORDER — METHOCARBAMOL 1000 MG/10ML IJ SOLN
1000.0000 mg | Freq: Three times a day (TID) | INTRAVENOUS | Status: DC
  Administered 2020-09-11 – 2020-09-15 (×12): 1000 mg via INTRAVENOUS
  Filled 2020-09-11 (×14): qty 10

## 2020-09-11 NOTE — Evaluation (Signed)
Physical Therapy Evaluation Patient Details Name: Thomas [name] MRN: [id number] DOB: 03/23/1956 Today's Date: 09/11/2020   History of Present Illness  65 y/o male presented to ED on 4/28 with complaints of chest pain and abdominal pain. CT concering for possible malignancy of large bowel. 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 On 5/4, pt underwent exploratory laparotomy, mobilization of the splenic flexure, transverse colectomy and end colostomy. PMH: epilepsy, Type II DM, HTN, frontal meningioma s/p lobectomy, hx of acute respiratory failure with hypoxia.  Clinical Impression  Pt was I prior to admission, but is now requiring min guard to S due to deficits in balance. Pt with decreased velocity during ambulation, but very motivated to ambulate. Pt requiring increased time for all functional mobility tasks during evaluation. Pt will benefit from skilled PT to address deficits in gait, endurance and balance to maximize independence with functional mobility prior to discharge. Anticipate pt will progress well with PT and no additional PT follow up will be needed following d/c.    Follow Up Recommendations No PT follow up    Equipment Recommendations  None recommended by PT    Recommendations for Other Services       Precautions / Restrictions Precautions Precautions: Fall;Other (comment) Precaution Comments: colostomy; Pt. reports 2 falls in the past 6 months Restrictions Weight Bearing Restrictions: No      Mobility  Bed Mobility Overal bed mobility: Modified Independent                  Transfers Overall transfer level: Needs assistance Equipment used: None Transfers: Sit to/from Stand Sit to Stand: Supervision            Ambulation/Gait Ambulation/Gait assistance: Supervision;Min guard Gait Distance (Feet): 150 Feet Assistive device: None       General Gait Details: wide BOS, increased lateral weight  shifting, decreased velocity, min guard initially progressed to [name] Mobility    Modified Rankin (Stroke Patients Only)       Balance Overall balance assessment: Mild deficits observed, not formally tested                                           Pertinent Vitals/Pain Pain Assessment: No/denies pain    Home Living Family/patient expects to be discharged to:: Private residence Living Arrangements: Spouse/significant other;Children Available Help at Discharge: Family;Available PRN/intermittently Type of Home: House Home Access: Stairs to enter Entrance Stairs-Rails: Can reach both Entrance Stairs-Number of Steps: 3 Home Layout: Multi-level;Able to live on main level with bedroom/bathroom Home Equipment: Kasandra Knudsen - single point;Other (comment)      Prior Function Level of Independence: Independent         Comments: independent with ADLs, does not drive since brain tumor resection in 2017. Family assist with transportation, IADLs as needed     Hand Dominance   Dominant Hand: Right    Extremity/Trunk Assessment   Upper Extremity Assessment Upper Extremity Assessment: Defer to OT evaluation    Lower Extremity Assessment Lower Extremity Assessment: Generalized weakness    Cervical / Trunk Assessment Cervical / Trunk Assessment: Normal  Communication   Communication: No difficulties  Cognition Arousal/Alertness: Awake/alert Behavior During Therapy: WFL for tasks assessed/performed Overall Cognitive Status: Within Functional Limits for tasks assessed  General Comments General comments (skin integrity, edema, etc.): VSS on RA    Exercises     Assessment/Plan    PT Assessment Patient needs continued PT services  PT Problem List Decreased balance;Decreased activity tolerance;Decreased mobility       PT Treatment Interventions Therapeutic exercise;Gait  training;Balance training;Patient/family education    PT Goals (Current goals can be found in the Care Plan section)  Acute Rehab PT Goals Patient Stated Goal: go home when able PT Goal Formulation: With patient Time For Goal Achievement: 09/25/20 Potential to Achieve Goals: Good    Frequency Min 3X/week   Barriers to discharge        Co-evaluation               AM-PAC PT "6 Clicks" Mobility  Outcome Measure Help needed turning from your back to your side while in a flat bed without using bedrails?: None Help needed moving from lying on your back to sitting on the side of a flat bed without using bedrails?: None Help needed moving to and from a bed to a chair (including a wheelchair)?: A Little Help needed standing up from a chair using your arms (e.g., wheelchair or bedside chair)?: A Little Help needed to walk in hospital room?: A Little Help needed climbing 3-5 steps with a railing? : A Little 6 Click Score: 20    End of Session Equipment Utilized During Treatment: Gait belt Activity Tolerance: Patient tolerated treatment well Patient left: in chair;with call bell/phone within reach;with chair alarm set Nurse Communication: Mobility status PT Visit Diagnosis: Muscle weakness (generalized) (M62.81);Unsteadiness on feet (R26.81)    Time: 9767-3419 PT Time Calculation (min) (ACUTE ONLY): 23 min   Charges:   PT Evaluation $PT Eval Low Complexity: 1 Low PT Treatments $Gait Training: 8-22 mins        Lyanne Co, DPT Acute Rehabilitation Services [phone]  [place] 09/11/2020, 1:09 PM

## 2020-09-11 NOTE — Progress Notes (Addendum)
Family Medicine Teaching Service Daily Progress Note Intern Pager: [phone]  Patient name: Thomas [name] Medical record number: [id number] Date of birth: [birth year] Age: 65 y.o. 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 male who presents with abdominal pain found to have a likely malignant tumor in the transverse colon, POD#1 s/p right hemicolectomy. PMHx significant for epilepsy, T2DM, HTN, frontal meningioma s/p lobectomy.   Invasive Adenocarcinoma of transverse colon POD #1 Right Hemicolectomy Patient underwent right hemicolectomy for Invasive Adenocarcinoma of Transverse colon with General Surgery, and recommendations are appreciated. Patient is doing well, and denies significant pain. We will continue monitor labs and physical exam. Heme/Onc will establish outpatient follow up.    --General Surgery following, recommendations appreciated  Per Surgery: --continue entereg until return of bowel function -- clamp NGT this AM and allow ice chips/sips --start to mobilize, PT/OT consults -- WOC consult for teaching on new stoma -- surgical path pending     HTN, stable BP this morning 113/58. Home medications have been held for 2 nights due to NPO status for surgery.We will monitor and manage via IV medications prn.  Home medications: amlodipine 10 mg daily, HCTZ 25 mg daily, lisinopril 40 mg daily. --hold home medications --recommend IV labetalol as needed   T2DM with peripheral neuropathy Stable  Glucose on fasting CBG as 277 this morning. Lantus has been held for the past 2 nights due  to NPO status. We are also holding all PO home medications.   --continue hold Lantus 5 units QHS --continue Moderate SSI --Monitor CBG --hold home gabapentin 600 mg 3 times daily as needed --hold Atorvastatin 20mg  daily   Peripheral edema, stable home medications: furosemide 40 mg daily.  --continue holding furosemide for now --Compression stockings as needed   Epilepsy, stable Stable. Receiving IV keppra due to NPO status.   Home meds: levetiracetam 1000 mg BID --continue IV Keppra   GERD --continue IV Protonix 40mg     FEN/GI: IVF LR 155ml/hr, NPO except for Ice chips and sips w/ meds PPx: SCDs, Heparin SubQ   Status is: Inpatient  Remains inpatient appropriate because:Inpatient level of care appropriate due to severity of illness   Dispo: The patient is from: Home              Anticipated d/c is to: Home              Patient currently is not medically stable to d/c.   Difficult to place patient No        Subjective:  Thomas [name] reports that he is not having any current pain. He reports occasional pain with cough. Denies any incisional pain. He has not passed flatus yet.   Objective: Temp:  [97.6 F (36.4 C)-98.5 F (36.9 C)] 98.4 F (36.9 C) (05/05 0520) Pulse Rate:  [73-97] 97 (05/05 0520) Resp:  [12-21] 19 (05/05 0520) BP: (100-159)/(56-77) 113/58 (05/05 0520) SpO2:  [88 %-100 %] 90 % (05/05 0520) Weight:  [138.4 kg] 138.4 kg (05/04 0932) Physical Exam: General: Patient lying in bed.  Cardiovascular: Regular rate and rhythm  Respiratory: NG tube in place (clamped). Normal work of breathing. Lungs  CTA b/l Abdomen: Mild line honeycomb dressing. Right ostomy wound with ostomy bag. [place] bag with sanguinous output. Hypoactive bowel sounds. Non-tender to palpation Extremities: SCDs in place. No peripheral edema Skin: Warm and Dry  Laboratory: Recent Labs  Lab 09/09/20 0111 09/10/20 0034 09/11/20 0255  WBC 12.4* 10.8* 21.2*  HGB 11.4* 11.4* 11.6*   HCT 32.7* 33.6* 34.2*  PLT 357 376 414*   Recent Labs  Lab 09/04/20 0905 09/05/20 0348 09/07/20 0051 09/09/20 0111 09/10/20 0034 09/11/20 0255  NA  --  136   < > 134* 132* 134*  K  --  4.0   < > 3.5 3.4* 4.7  CL  --  99   < > 99 97* 100  CO2  --  28   < > 25 27 26   BUN  --  13   < > 19 20 19   CREATININE  --  1.17   < > 1.29* 1.30* 1.55*  CALCIUM  --  9.2   < > 8.9 8.8* 8.5*  PROT 6.6 7.4  --   --   --   --   BILITOT 0.5 0.8  --   --   --   --   ALKPHOS 73 82  --   --   --   --   ALT 19 21  --   --   --   --   AST 13* 14*  --   --   --   --   GLUCOSE  --  178*   < > 232* 307* 260*   < > = values in this interval not displayed.     Imaging/Diagnostic Tests:  Jacklynn Bue, Medical Student 09/11/2020, 7:48 AM MS4, [place] Intern pager: [phone], text pages welcome  FPTS Upper-Level Resident Addendum   I have independently interviewed and examined the patient. I have discussed the above with the original author and agree with their documentation. Please see also any attending notes.    Carollee Leitz, MD PGY-2, [facility] 09/11/2020 1:09 PM  FPTS Service pager: [phone] (text pages welcome through [facility]

## 2020-09-11 NOTE — Evaluation (Addendum)
Occupational Therapy Evaluation/Discharge Patient Details Name: Thomas [name] MRN: [id number] DOB: [birth year] Today's Date: 09/11/2020    History of Present Illness 65 y/o male presented to ED on 4/28 with complaints of chest pain and abdominal pain. CT concering for possible malignancy of large bowel. 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 On 5/4, pt underwent exploratory laparotomy, mobilization of the splenic flexure, transverse colectomy and end colostomy. PMH: epilepsy, Type II DM, HTN, frontal meningioma s/p lobectomy, hx of acute respiratory failure with hypoxia.   Clinical Impression   PTA, pt lives with family and reports typically Independent in all ADLs and mobility without AD. Pt presents now s/p surgery above on 5/4 with new therapy orders. Pt with slow pace and mildly lethargic due to pain meds but able to mobilize Independently in room without AD. Mild lightheadedness reported that subsided. Pt able to demo ADLs standing at sink Independently. Pt with hx of challenges with LB dressing at baseline. Reinforced use of AE as needed (handout provided), as well as positional strategies (pt able to prop foot up on chair to reach better), family assist as needed, and clothing alternatives. Anticipate pt with return to normal activities quickly with increased OOB activities with no further skilled OT services needed at acute level or on DC. OT to sign off.     Follow Up Recommendations  No OT follow up    Equipment Recommendations  None recommended by OT    Recommendations for Other Services       Precautions / Restrictions Precautions Precautions: Fall;Other (comment) Precaution Comments: colostomy; Pt. reports 2 falls in the past 6 months Restrictions Weight Bearing Restrictions: No      Mobility Bed Mobility Overal bed mobility: Modified Independent             General bed mobility comments: increased time, use of  bedrail    Transfers Overall transfer level: Independent Equipment used: None                  Balance Overall balance assessment: No apparent balance deficits (not formally assessed)                                         ADL either performed or assessed with clinical judgement   ADL Overall ADL's : At baseline                                       General ADL Comments: Pt with difficulty donning socks at baseline, recalls previous OT eval/education on their use. Reports typically propping up foot on chair to reach better. educated on other repositioning techniques, wearing clothing that is easier to manage, use of AE (handout provided) or family assist as needed. Pt able to stand, walk to sink and perform grooming tasks without issue     Vision Baseline Vision/History: Cataracts Patient Visual Report: No change from baseline Vision Assessment?: No apparent visual deficits     Perception     Praxis      Pertinent Vitals/Pain Pain Assessment: Faces Faces Pain Scale: Hurts a little bit Pain Location: abdomen Pain Descriptors / Indicators: Sore Pain Intervention(s): Monitored during session;Premedicated before session;Repositioned     Hand Dominance Right   Extremity/Trunk Assessment Upper Extremity Assessment Upper Extremity Assessment:  Overall [facility] for tasks assessed   Lower Extremity Assessment Lower Extremity Assessment: Defer to PT evaluation   Cervical / Trunk Assessment Cervical / Trunk Assessment: Normal   Communication Communication Communication: No difficulties   Cognition Arousal/Alertness: Awake/alert Behavior During Therapy: WFL for tasks assessed/performed Overall Cognitive Status: Within Functional Limits for tasks assessed                                 General Comments: reports mild cognitive deficits due to tumor resection in 2017; very pleasant, follows all directions   General  Comments  Encouraged use of energy conservation strategies as needed as pt slow pace and lethargic today (though had just been given diluadid). Encouraged continued activity and gradual progression of endurance    Exercises     Shoulder Instructions      Home Living Family/patient expects to be discharged to:: Private residence Living Arrangements: Spouse/significant other;Children Available Help at Discharge: Family;Available PRN/intermittently Type of Home: House Home Access: Stairs to enter [employer] of Steps: 3 Entrance Stairs-Rails: Right;Left Home Layout: Multi-level;Able to live on main level with bedroom/bathroom     Bathroom Shower/Tub: Teacher, early years/pre: Standard     Home Equipment: Cane - single point;Other (comment) (may have access to shower chair)          Prior Functioning/Environment Level of Independence: Independent        Comments: independent with ADLs, does not drive since brain tumor resection in 2017. Family assist with transportation, IADLs as needed        OT Problem List:        OT Treatment/Interventions:      OT Goals(Current goals can be found in the care plan section) Acute Rehab OT Goals Patient Stated Goal: go home when able OT Goal Formulation: All assessment and education complete, DC therapy  OT Frequency:     Barriers to D/C:            Co-evaluation              AM-PAC OT "6 Clicks" Daily Activity     Outcome Measure Help from another person eating meals?: None Help from another person taking care of personal grooming?: None Help from another person toileting, which includes using toliet, bedpan, or urinal?: None Help from another person bathing (including washing, rinsing, drying)?: None Help from another person to put on and taking off regular upper body clothing?: None Help from another person to put on and taking off regular lower body clothing?: None 6 Click Score: 24   End of  Session Nurse Communication: Mobility status  Activity Tolerance: Patient tolerated treatment well Patient left: in bed;with call bell/phone within reach;with nursing/sitter in room  OT Visit Diagnosis: Pain Pain - part of body:  (abdomen)                Time: 6294-7654 OT Time Calculation (min): 23 min Charges:  OT General Charges $OT Visit: 1 Visit OT Evaluation $OT Eval Low Complexity: 1 Low OT Treatments $Self Care/Home Management : 8-22 mins  Malachy Chamber, OTR/L Acute Rehab Services Office: [phone]  Layla Maw 09/11/2020, 9:51 AM

## 2020-09-11 NOTE — Consult Note (Addendum)
[place] Nurse ostomy consult note Patient care given in 5W04 Transverse colostomy created 09/10/20 Stoma type/location: RUQ colostomy Stomal assessment/size: 2 1/4" pink, moist, swollen Peristomal assessment: Intact Output: 25cc thin brown stool Ostomy pouching: 2pc. 2 3/4" with barrier ring Education provided: None today, patient sleepy Enrolled patient in [place] program: No  2 piece pouching system   Fecal pouches , [place] #[phone]              Skin Nils Pyle # 2     Kizzie Bane, [place] # [place] Tamala Julian, MSN, RN, [place], Lysle Pearl, [facility] Wound Treatment Associate Pager [phone]

## 2020-09-11 NOTE — Progress Notes (Signed)
Nutrition Follow-up  DOCUMENTATION CODES:   Morbid obesity  INTERVENTION:    When diet advanced, add PO supplements.  If unable to advance diet within the next 48 hours, consider TPN.   NUTRITION DIAGNOSIS:   Inadequate oral intake related to altered GI function as evidenced by NPO status.  Ongoing  GOAL:   Patient will meet greater than or equal to 90% of their needs  Unmet, NPO  MONITOR:   PO intake,Supplement acceptance,Diet advancement,Labs,Weight trends,Skin,I & O's  REASON FOR ASSESSMENT:   Consult Assessment of nutrition requirement/status  ASSESSMENT:   Thomas [name] is a 65 y.o. male presenting with one-month history of crampy abdominal pain that wraps around his back and radiates up to chest. PMH is significant for epilepsy, T2DM, HTN, frontal meningioma s/p lobectomy, hx acute respiratory failure with hypoxia.  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 5/4 - S/P exploratory laparotomy, mobilization of the splenic flexure, transverse colectomy, and end colostomy.  NG tube in place, clamped. PT working with patient.  Currently NPO except ice chips; previously on clear liquids with Boost Breeze TID from 4/29 to 5/4, completing 100% of meals. Unsure if he was drinking the [employer] supplements.  Although he has been consuming 100% of meals, intake has not been meeting nutrition needs since admission. Clear liquid diet provides ~1000 kcal per day and minimal protein. Max estimated protein intake since admission ~30 gm (25% of minimum estimated needs).  Labs reviewed. Na 134 CBG: 277-225  Medications reviewed and include Entereg, Novolog, Protonix. IVF: LR at 100 ml/h   Diet Order:   Diet Order            Diet NPO time specified Except for: Ice Chips, Sips with Meds  Diet effective now                 EDUCATION NEEDS:   Education needs have been addressed  Skin:  Skin Assessment: Reviewed RN  Assessment  Last BM:  5/2  Height:   Ht Readings from Last 1 Encounters:  09/10/20 5\' 8"  (1.727 m)    Weight:   Wt Readings from Last 1 Encounters:  09/10/20 (!) 138.4 kg    Ideal Body Weight:  70 kg  BMI:  Body mass index is 46.39 kg/m.  Estimated Nutritional Needs:   Kcal:  2100-2300  Protein:  120-140 grams  Fluid:  > 2 L    Lucas Mallow, RD, LDN, CNSC Please refer to Amion for contact information.

## 2020-09-11 NOTE — Progress Notes (Signed)
Progress Note  1 Day Post-Op  Subjective: Patient reports pain with coughing this AM. Overall doing well. Discussed mobilization today and patient agreeable.   Objective: Vital signs in last 24 hours: Temp:  [97.6 F (36.4 C)-98.5 F (36.9 C)] 98.4 F (36.9 C) (05/05 0520) Pulse Rate:  [73-97] 97 (05/05 0520) Resp:  [12-21] 19 (05/05 0520) BP: (100-159)/(56-77) 113/58 (05/05 0520) SpO2:  [88 %-100 %] 90 % (05/05 0520) Weight:  [138.4 kg] 138.4 kg (05/04 0932) Last BM Date: 09/08/20  Intake/Output from previous day: 05/04 0701 - 05/05 0700 In: 1600 [I.V.:1400; IV Piggyback:200] Out: 715 [Urine:565; Blood:150] Intake/Output this shift: No intake/output data recorded.  PE: General: pleasant, WD,obesemale who is laying in bed in NAD Heart: regular, rate, and rhythm.  Lungs: CTAB, no wheezes, rhonchi, or rales noted. Respiratory effort nonlabored Abd: soft, appropriately ttp, mildly distended, +BS, stoma edematous, some bloody drainage on dressing MS: all 4 extremities are symmetrical with no cyanosis, clubbing, or edema. Skin: warm and dry with no masses, lesions, or rashes Neuro: Cranial nerves 2-12 grossly intact, sensation is normal throughout Psych: A&Ox3 with an appropriate affect.    Lab Results:  Recent Labs    09/10/20 0034 09/11/20 0255  WBC 10.8* 21.2*  HGB 11.4* 11.6*  HCT 33.6* 34.2*  PLT 376 414*   BMET Recent Labs    09/10/20 0034 09/11/20 0255  NA 132* 134*  K 3.4* 4.7  CL 97* 100  CO2 27 26  GLUCOSE 307* 260*  BUN 20 19  CREATININE 1.30* 1.55*  CALCIUM 8.8* 8.5*   PT/INR No results for input(s): LABPROT, INR in the last 72 hours. CMP     Component Value Date/Time   NA 134 (L) 09/11/2020 0255   K 4.7 09/11/2020 0255   CL 100 09/11/2020 0255   CO2 26 09/11/2020 0255   GLUCOSE 260 (H) 09/11/2020 0255   BUN 19 09/11/2020 0255   CREATININE 1.55 (H) 09/11/2020 0255   CALCIUM 8.5 (L) 09/11/2020 0255   PROT 7.4 09/05/2020 0348    ALBUMIN 3.3 (L) 09/05/2020 0348   AST 14 (L) 09/05/2020 0348   ALT 21 09/05/2020 0348   ALKPHOS 82 09/05/2020 0348   BILITOT 0.8 09/05/2020 0348   GFRNONAA 50 (L) 09/11/2020 0255   GFRAA >60 04/01/2017 1921   Lipase     Component Value Date/Time   LIPASE 23 06/14/2015 1020       Studies/Results: CT CHEST WO CONTRAST  Result Date: 09/09/2020 CLINICAL DATA:  Staging workup of invasive adenocarcinoma of the transverse colon EXAM: CT CHEST WITHOUT CONTRAST TECHNIQUE: Multidetector CT imaging of the chest was performed following the standard protocol without IV contrast. COMPARISON:  Overlapping portions CT abdomen 09/04/2020; CT chest 04/09/2014 FINDINGS: Cardiovascular: Coronary, aortic arch, and branch vessel atherosclerotic vascular disease. Heart size within normal limits. Aortic valve calcification. Mediastinum/Nodes: Small mediastinal lymph nodes are not pathologically enlarged. Lungs/Pleura: Mild central airway thickening. 0.3 by 0.2 cm subpleural nodule in the right upper lobe along the major fissure on image 57 series 4 Chronic 3 mm calcified nodule in the left lower lobe on image 84 series 4 indicating old granulomatous disease. Upper Abdomen: The adenocarcinoma in the transverse colon is less conspicuous on today's noncontrast exam partially due to small caliber of the adjacent colon. Abdominal aortic atherosclerotic vascular calcification. Fluid density left mid kidney lesion is partially included on today's exam and probably a cyst. Musculoskeletal: Unremarkable IMPRESSION: 1. No specific findings of metastatic disease to the chest.  2. A 3 by 2 mm subpleural nodule in the right upper lobe along the major fissure is highly likely to be a benign subpleural lymph node but may warrant surveillance. 3.  Aortic Atherosclerosis (ICD10-I70.0).  Coronary atherosclerosis. 4. Airway thickening is present, suggesting bronchitis or reactive airways disease. Electronically Signed   By: Van Clines M.D.   On: 09/09/2020 17:09    Anti-infectives: Anti-infectives (From admission, onward)   Start     Dose/Rate Route Frequency Ordered Stop   09/10/20 0600  cefoTEtan (CEFOTAN) 2 g in sodium chloride 0.9 % 100 mL IVPB        2 g 200 mL/hr over 30 Minutes Intravenous On call to O.R. 09/09/20 1512 09/10/20 1105       Assessment/Plan HTN HLD GERD Epilespy on keppra DM2 Asthma Hx of meningioma s/p craniotomy by Dr. Christella Noa in 2018  Partiallyobstructing tumor in the transverse colon. S/P open transverse colectomy with end colostomy 09/11/20 Dr. Bobbye Morton - POD#1 - continue entereg until return of bowel function - clamp NGT this AM and allow ice chips/sips - start to mobilize, PT/OT consults - WOC consult for teaching on new stoma - surgical path pending   FEN -clamp NGT, ice chips, IVF VTE -SCDs, Heparin subq ID -cefotetan pre-op  LOS: 6 days    Norm Parcel, [facility] 09/11/2020, 9:16 AM Please see Amion for pager number during day hours 7:00am-4:30pm

## 2020-09-12 DIAGNOSIS — C184 Malignant neoplasm of transverse colon: Secondary | ICD-10-CM | POA: Diagnosis not present

## 2020-09-12 DIAGNOSIS — E1142 Type 2 diabetes mellitus with diabetic polyneuropathy: Secondary | ICD-10-CM | POA: Diagnosis not present

## 2020-09-12 DIAGNOSIS — E118 Type 2 diabetes mellitus with unspecified complications: Secondary | ICD-10-CM

## 2020-09-12 DIAGNOSIS — K5730 Diverticulosis of large intestine without perforation or abscess without bleeding: Secondary | ICD-10-CM | POA: Diagnosis not present

## 2020-09-12 LAB — CBC
HCT: 30 % — ABNORMAL LOW (ref 39.0–52.0)
Hemoglobin: 10.1 g/dL — ABNORMAL LOW (ref 13.0–17.0)
MCH: 25.4 pg — ABNORMAL LOW (ref 26.0–34.0)
MCHC: 33.7 g/dL (ref 30.0–36.0)
MCV: 75.6 fL — ABNORMAL LOW (ref 80.0–100.0)
Platelets: 380 10*3/uL (ref 150–400)
RBC: 3.97 MIL/uL — ABNORMAL LOW (ref 4.22–5.81)
RDW: 15.1 % (ref 11.5–15.5)
WBC: 19.3 10*3/uL — ABNORMAL HIGH (ref 4.0–10.5)
nRBC: 0 % (ref 0.0–0.2)

## 2020-09-12 LAB — BASIC METABOLIC PANEL
Anion gap: 9 (ref 5–15)
BUN: 21 mg/dL (ref 8–23)
CO2: 27 mmol/L (ref 22–32)
Calcium: 8.3 mg/dL — ABNORMAL LOW (ref 8.9–10.3)
Chloride: 98 mmol/L (ref 98–111)
Creatinine, Ser: 1.67 mg/dL — ABNORMAL HIGH (ref 0.61–1.24)
GFR, Estimated: 46 mL/min — ABNORMAL LOW (ref >60)
Glucose, Bld: 210 mg/dL — ABNORMAL HIGH (ref 70–99)
Potassium: 4.2 mmol/L (ref 3.5–5.1)
Sodium: 134 mmol/L — ABNORMAL LOW (ref 135–145)

## 2020-09-12 LAB — GLUCOSE, CAPILLARY
Glucose-Capillary: 215 mg/dL — ABNORMAL HIGH (ref 70–99)
Glucose-Capillary: 182 mg/dL — ABNORMAL HIGH (ref 70–99)
Glucose-Capillary: 164 mg/dL — ABNORMAL HIGH (ref 70–99)
Glucose-Capillary: 186 mg/dL — ABNORMAL HIGH (ref 70–99)

## 2020-09-12 MED ORDER — LACTATED RINGERS IV BOLUS
500.0000 mL | Freq: Once | INTRAVENOUS | Status: AC
  Administered 2020-09-12: 500 mL via INTRAVENOUS

## 2020-09-12 MED ORDER — OXYCODONE HCL 5 MG/5ML PO SOLN
5.0000 mg | ORAL | Status: DC | PRN
  Administered 2020-09-12 – 2020-09-14 (×7): 10 mg via ORAL
  Administered 2020-09-14: 5 mg via ORAL
  Administered 2020-09-14 – 2020-09-15 (×4): 10 mg via ORAL
  Filled 2020-09-12: qty 5
  Filled 2020-09-12 (×11): qty 10

## 2020-09-12 MED ORDER — INSULIN GLARGINE 100 UNIT/ML ~~LOC~~ SOLN
5.0000 [IU] | Freq: Every day | SUBCUTANEOUS | Status: DC
  Administered 2020-09-12 – 2020-09-16 (×5): 5 [IU] via SUBCUTANEOUS
  Filled 2020-09-12 (×7): qty 0.05

## 2020-09-12 MED ORDER — ACETAMINOPHEN 500 MG PO TABS
1000.0000 mg | ORAL_TABLET | Freq: Four times a day (QID) | ORAL | Status: DC
  Administered 2020-09-12 – 2020-09-17 (×18): 1000 mg via ORAL
  Filled 2020-09-12 (×21): qty 2

## 2020-09-12 NOTE — Progress Notes (Addendum)
Family Medicine Teaching Service Daily Progress Note Intern Pager: [phone]  Patient name: Thomas [name] Medical record number: [id number] Date of birth: 16-Apr-1956 Age: 65 y.o. Gender: male  Primary Care Provider: Benito Mccreedy, MD Consultants: General Surgery/ GI Code Status: Full  Pt Overview and Major Events to Date:  4/28- admitted  4/29- Colonoscopy with finding of likely malignant completely obstructing tumor in transverse colon 5/2- Biopsy results showedInvasive Adenocarcinoma in Transverse Colon and Tubular Adenoma rectal polyp 5/4- Right Hemicolectomy   Assessment and Plan: Thomas Garringer Henryis a 65 y.o.malewho presents with abdominal pain found to have a likely malignant tumor in the transverse colon, POD#1 s/p right hemicolectomy. PMHx significant forepilepsy, T2DM, HTN, frontal meningioma s/p lobectomy  Invasive Adenocarcinoma of transverse colon POD #2 Right Hemicolectomy POD #2 s/p right hemicolectomy with end colostomy. Thomas [name] reports adequate pain control and surgical sites look good. General Surgery, and recommendations are appreciated.  CBC is significant for WBC 19.3 trending down from 21, Hbg is 10.1 from 11 and Plts are 380. Leukocytosis and anemia are most likely result of surgery and are expected to improve. We will continue to monitor.  --General Surgery following, recommendations appreciated --monitor labs (CBC, BMP) Per Surgery: continue entereg until return of bowel function - clamp NGT, start CLD - start to mobilize, PT/OT consults - WOC consult for teaching on new stoma - surgical path pending --scheduled Acetaminophen 1000mg  Q6H --Methacarbamol 500mg  in D5 Q8H --Dilaudid 0.5-1mg  Q3H prn --Oxy IR 5-10mg  Q4H prn    AKI Today's Cr is 1.67 increased from 1.55, BUN 21 yesterday. Mostly likely pre-renal azotemia from dehydration. Previously NPO with NG tube removed this morning and was receiving maintanance fluids at 193mL/hr. We will give a 540mL  bolus and increase rate to 166mL/hr and with advanced diet to CLD, we expect renal function to improve with increased intake. --LR 515mL bolus --increase IVF rate to 171mL/hr  HTN, stable BP was 155/70 this morning. PO antihypertensives are currently being held due to bowel rest post-op. We will continue to monitor  --monitor BP  T2DM with peripheral neuropathy Fasting CBG was 215. Blood Glucose has been ranging in the mid 200s. We will add on Lantus 5units and adjust with advances in diet. On moderateSSI. We will continue to monitor. --start Lantus 5 units --mSSI  Peripheral edema, stable Mild peripheral edema on exam. Home Lasix was held at admission  Epilepsy, stable --receiving IV Keppra 1000mg    GERD, stable --receiving IV Protonix 40mg   FEN/GI: Clear Liquid Diet PPx: SCDs/ Heparin 5000units   Status is: Inpatient  Remains inpatient appropriate because:Inpatient level of care appropriate due to severity of illness   Dispo: The patient is from: Home              Anticipated d/c is to: Home              Patient currently is not medically stable to d/c.   Difficult to place patient No  Subjective:  Thomas [name] reports that his NG tube was accidentally pulled out this morning. He reports that his pain is a 7/10 but states that he pain control is adequate. He denies any pain near incision or ostomy. He does not report an difficulty urinating. He reports good mobility.  Objective: Temp:  [97.6 F (36.4 C)-98.5 F (36.9 C)] 98 F (36.7 C) (05/06 0448) Pulse Rate:  [90-100] 100 (05/06 0448) Resp:  [17-20] 17 (05/06 0448) BP: (105-155)/(49-70) 155/70 (05/06 0448) SpO2:  [90 %-91 %] 91 % (  05/06 0448) Physical Exam: General: Laying in bed Cardiovascular: Regular rate and rhythm Respiratory: Normal WOB of breathing, lungs clear bilaterally Abdomen: Mid-line honeycomb dressing with minimal sanguinous discharge. Mildly swollen stoma. Ostomy bag with bilious output.  Mild bowel  sounds, mildly distended, soft, non-tender to palpation Extremities: 1+ non-pitting lower extremity non-pitting edema    Laboratory: Recent Labs  Lab 09/10/20 0034 09/11/20 0255 09/12/20 0056  WBC 10.8* 21.2* 19.3*  HGB 11.4* 11.6* 10.1*  HCT 33.6* 34.2* 30.0*  PLT 376 414* 380   Recent Labs  Lab 09/10/20 0034 09/11/20 0255 09/12/20 0056  NA 132* 134* 134*  K 3.4* 4.7 4.2  CL 97* 100 98  CO2 27 26 27   BUN 20 19 21   CREATININE 1.30* 1.55* 1.67*  CALCIUM 8.8* 8.5* 8.3*  GLUCOSE 307* 260* 210*     Imaging/Diagnostic Tests:  Jacklynn Bue, Medical Student 09/12/2020, 7:20 AM MS4, [place] Intern pager: [phone], text pages welcome  Resident Attestation  I saw and evaluated the patient, performing the key elements of the service.I  personally performed or re-performed the history, physical exam, and medical decision making activities of this service and have verified that the service and findings are accurately documented in the student's note. I developed the management plan that is described in the medical student's note, and I agree with the content, with my edits above.    Gifford Shave, PGY2

## 2020-09-12 NOTE — Progress Notes (Signed)
Inpatient Diabetes Program Recommendations  AACE/ADA: New Consensus Statement on Inpatient Glycemic Control (2015)  Target Ranges:  Prepandial:   less than 140 mg/dL      Peak postprandial:   less than 180 mg/dL (1-2 hours)      Critically ill patients:  140 - 180 mg/dL   Lab Results  Component Value Date   GLUCAP 215 (H) 09/12/2020   HGBA1C 8.6 (H) 08/26/2016    Review of Glycemic Control  Results for SEWARD, CORAN (MRN [id number]) as of 09/12/2020 09:49  Ref. Range 09/11/2020 07:46 09/11/2020 12:00 09/11/2020 16:22 09/11/2020 21:11 09/12/2020 07:28  Glucose-Capillary Latest Ref Range: 70 - 99 mg/dL 277 (H) 225 (H) 215 (H) 188 (H) 215 (H)   Diabetes history: DM 2 Outpatient Diabetes medications: metformin 500 mg bid Current orders for Inpatient glycemic control:  Novolog 0-15 units tid + hs  Boost tid between meals  Inpatient Diabetes Program Recommendations:    -  Restart Lantus 8 units  Thanks,  Tama Headings RN, MSN, BC-ADM Inpatient Diabetes Coordinator Team Pager [phone] (8a-5p)

## 2020-09-12 NOTE — Progress Notes (Signed)
Family Medicine Teaching Service Daily Progress Note Intern Pager: [phone]  Patient name: Thomas [name] Medical record number: [id number] Date of birth: 10-27-1955 Age: 65 y.o. Gender: male  Primary Care Provider: Benito Mccreedy, MD Consultants: General Surgery/ GI Code Status: Full  Pt Overview and Major Events to Date:  4/28- admitted  4/29- Colonoscopy with finding of likely malignant completely obstructing tumor in transverse colon 5/2- Biopsy results showedInvasive Adenocarcinoma in Transverse Colon and Tubular Adenoma rectal polyp 5/4- Right Hemicolectomy   Assessment and Plan: Thomas [name] a 65 y.o.malewho presents with abdominal pain found to have a likely malignant tumor in the transverse colon, POD#1 s/p right hemicolectomy. PMHx significant forepilepsy, T2DM, HTN, frontal meningioma s/p lobectomy  Invasive Adenocarcinoma of transverse colon POD #3 Right Hemicolectomy POD 3.  NG clamped on 5/6, has been tolerating clear liquid diet.  WBC improving from 18 to 16.  Hgb slightly decreased from 10.1 to 9.4.  Pain 6/10, has taken oxy and dilaudid overnight.  --General Surgery following, recommendations appreciated --monitor labs (CBC, BMP) Per Surgery: - continue entereg until return of bowel function - clamp NGT, cont CLD pending surgery recs - start to mobilize, PT/OT consults - WOC consult for teaching on new stoma - surgical path pending --scheduled Acetaminophen 1000mg  Q6H --Methacarbamol 500mg  in D5 Q8H --Dilaudid 0.5-1mg  Q3H prn --Oxy IR 5-10mg  Q4H prn   AKI: Resolved Cr 1.04 now, improved from 1.67.  S/P 500cc LR bolus and increased LR to 125 cc/hr on 5/6.  Now tolerating clear liquids. - IVF cont at [facility] 125cc/hr - diet per above  HTN, stable BP 144/65 this AM --monitor BP - holding PO BP meds in post op setting  T2DM with peripheral neuropathy Given Lantus 5U overnight, glucose this AM 159. --cont Lantus 5 units --mSSI  Peripheral edema,  stable Mild peripheral edema on exam. Home Lasix was held at admission  Epilepsy, stable --receiving IV Keppra 1000mg    GERD, stable --receiving IV Protonix 40mg   FEN/GI: Clear Liquid Diet PPx: SCDs/ Heparin 5000units   Status is: Inpatient  Remains inpatient appropriate because:Inpatient level of care appropriate due to severity of illness   Dispo: The patient is from: Home              Anticipated d/c is to: Home              Patient currently is not medically stable to d/c.   Difficult to place patient No  Subjective:  Patient reporting feeling okay.   Pain 6/10.  Has been tolerating water.  No other complaints.   Objective: Temp:  [97.8 F (36.6 C)-98.2 F (36.8 C)] 98.2 F (36.8 C) (05/06 2020) Pulse Rate:  [99-103] 99 (05/06 2020) Resp:  [16-17] 17 (05/06 2020) BP: (144-164)/(69-71) 144/69 (05/06 2020) SpO2:  [91 %-93 %] 93 % (05/06 2020)  Physical Exam:  General: 65 y.o. male in NAD HEENT: NCAT Cardio: RRR no m/r/g Lungs: CTAB, no wheezing, no rhonchi, no crackles, no IWOB on RA Abdomen: Soft, midline dressing without surrounding erythema, ostomy bag in place Skin: warm and dry Extremities: Trace BLE edema   Laboratory: Recent Labs  Lab 09/10/20 0034 09/11/20 0255 09/12/20 0056  WBC 10.8* 21.2* 19.3*  HGB 11.4* 11.6* 10.1*  HCT 33.6* 34.2* 30.0*  PLT 376 414* 380   Recent Labs  Lab 09/10/20 0034 09/11/20 0255 09/12/20 0056  NA 132* 134* 134*  K 3.4* 4.7 4.2  CL 97* 100 98  CO2 27 26 27   BUN  20 19 21   CREATININE 1.30* 1.55* 1.67*  CALCIUM 8.8* 8.5* 8.3*  GLUCOSE 307* 260* 210*     Imaging/Diagnostic Tests:  [place] Constable, D.O.  PGY-3 Family Medicine  09/13/2020 7:20 AM  Team Pager: [phone], Text pages welcome

## 2020-09-12 NOTE — Progress Notes (Signed)
General Surgery Follow Up Note  Subjective:    Overnight Issues:   Objective:  Vital signs for last 24 hours: Temp:  [97.6 F (36.4 C)-98.5 F (36.9 C)] 98 F (36.7 C) (05/06 0448) Pulse Rate:  [90-100] 100 (05/06 0448) Resp:  [17-20] 17 (05/06 0448) BP: (105-155)/(49-70) 155/70 (05/06 0448) SpO2:  [90 %-91 %] 91 % (05/06 0448)  Hemodynamic parameters for last 24 hours:    Intake/Output from previous day: No intake/output data recorded.  Intake/Output this shift: No intake/output data recorded.  Vent settings for last 24 hours:    Physical Exam:  Gen: comfortable, no distress Neuro: non-focal exam HEENT: PERRL Neck: supple CV: RRR Pulm: unlabored breathing Abd: soft, appropriately TTP, incision with minimal drainage present GU: clear yellow urine, spont voids Extr: wwp, no edema   Results for orders placed or performed during the hospital encounter of 09/04/20 (from the past 24 hour(s))  Glucose, capillary     Status: Abnormal   Collection Time: 09/11/20  7:46 AM  Result Value Ref Range   Glucose-Capillary 277 (H) 70 - 99 mg/dL  Glucose, capillary     Status: Abnormal   Collection Time: 09/11/20 12:00 PM  Result Value Ref Range   Glucose-Capillary 225 (H) 70 - 99 mg/dL  Glucose, capillary     Status: Abnormal   Collection Time: 09/11/20  4:22 PM  Result Value Ref Range   Glucose-Capillary 215 (H) 70 - 99 mg/dL  Glucose, capillary     Status: Abnormal   Collection Time: 09/11/20  9:11 PM  Result Value Ref Range   Glucose-Capillary 188 (H) 70 - 99 mg/dL  CBC     Status: Abnormal   Collection Time: 09/12/20 12:56 AM  Result Value Ref Range   WBC 19.3 (H) 4.0 - 10.5 K/uL   RBC 3.97 (L) 4.22 - 5.81 MIL/uL   Hemoglobin 10.1 (L) 13.0 - 17.0 g/dL   HCT 30.0 (L) 39.0 - 52.0 %   MCV 75.6 (L) 80.0 - 100.0 fL   MCH 25.4 (L) 26.0 - 34.0 pg   MCHC 33.7 30.0 - 36.0 g/dL   RDW 15.1 11.5 - 15.5 %   Platelets 380 150 - 400 K/uL   nRBC 0.0 0.0 - 0.2 %  Basic  metabolic panel     Status: Abnormal   Collection Time: 09/12/20 12:56 AM  Result Value Ref Range   Sodium 134 (L) 135 - 145 mmol/L   Potassium 4.2 3.5 - 5.1 mmol/L   Chloride 98 98 - 111 mmol/L   CO2 27 22 - 32 mmol/L   Glucose, Bld 210 (H) 70 - 99 mg/dL   BUN 21 8 - 23 mg/dL   Creatinine, Ser 1.67 (H) 0.61 - 1.24 mg/dL   Calcium 8.3 (L) 8.9 - 10.3 mg/dL   GFR, Estimated 46 (L) >60 mL/min   Anion gap 9 5 - 15  Glucose, capillary     Status: Abnormal   Collection Time: 09/12/20  7:28 AM  Result Value Ref Range   Glucose-Capillary 215 (H) 70 - 99 mg/dL    Assessment & Plan:  Present on Admission: . Abdominal pain . 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 Essential hypertension . Diabetic polyneuropathy associated with type 2 diabetes mellitus ([place])    LOS: 7 days   Additional comments:I reviewed the patient's new clinical lab test results.   and I reviewed the patients new imaging test results.    Partiallyobstructing tumor in the transverse colon.  S/P open transverse colectomy with end colostomy 09/11/20 Dr. Bobbye Morton - POD#2 - continue entereg until return of bowel function - clamp NGT, start CLD - start to mobilize, PT/OT consults - WOC consult for teaching on new stoma - surgical path pending   FEN -clamp NGT, ice chips, IVF VTE -SCDs, Heparin subq  [name], MD Trauma & General Surgery Please use [email or web address] to contact on call provider  09/12/2020  *Care during the described time interval was provided by me. I have reviewed this patient's available data, including medical history, events of note, physical examination and test results as part of my evaluation.

## 2020-09-13 DIAGNOSIS — Z933 Colostomy status: Secondary | ICD-10-CM | POA: Diagnosis not present

## 2020-09-13 DIAGNOSIS — C184 Malignant neoplasm of transverse colon: Secondary | ICD-10-CM | POA: Diagnosis not present

## 2020-09-13 DIAGNOSIS — E1142 Type 2 diabetes mellitus with diabetic polyneuropathy: Secondary | ICD-10-CM | POA: Diagnosis not present

## 2020-09-13 DIAGNOSIS — E118 Type 2 diabetes mellitus with unspecified complications: Secondary | ICD-10-CM | POA: Diagnosis not present

## 2020-09-13 LAB — CBC WITH DIFFERENTIAL/PLATELET
Abs Immature Granulocytes: 0.2 10*3/uL — ABNORMAL HIGH (ref 0.00–0.07)
Basophils Absolute: 0.1 10*3/uL (ref 0.0–0.1)
Basophils Relative: 1 %
Eosinophils Absolute: 0.3 10*3/uL (ref 0.0–0.5)
Eosinophils Relative: 2 %
HCT: 27.5 % — ABNORMAL LOW (ref 39.0–52.0)
Hemoglobin: 9.4 g/dL — ABNORMAL LOW (ref 13.0–17.0)
Immature Granulocytes: 1 %
Lymphocytes Relative: 11 %
Lymphs Abs: 1.8 10*3/uL (ref 0.7–4.0)
MCH: 25.7 pg — ABNORMAL LOW (ref 26.0–34.0)
MCHC: 34.2 g/dL (ref 30.0–36.0)
MCV: 75.1 fL — ABNORMAL LOW (ref 80.0–100.0)
Monocytes Absolute: 1.5 10*3/uL — ABNORMAL HIGH (ref 0.1–1.0)
Monocytes Relative: 9 %
Neutro Abs: 12.1 10*3/uL — ABNORMAL HIGH (ref 1.7–7.7)
Neutrophils Relative %: 76 %
Platelets: 342 10*3/uL (ref 150–400)
RBC: 3.66 MIL/uL — ABNORMAL LOW (ref 4.22–5.81)
RDW: 15 % (ref 11.5–15.5)
WBC: 16 10*3/uL — ABNORMAL HIGH (ref 4.0–10.5)
nRBC: 0 % (ref 0.0–0.2)

## 2020-09-13 LAB — GLUCOSE, CAPILLARY
Glucose-Capillary: 172 mg/dL — ABNORMAL HIGH (ref 70–99)
Glucose-Capillary: 162 mg/dL — ABNORMAL HIGH (ref 70–99)
Glucose-Capillary: 173 mg/dL — ABNORMAL HIGH (ref 70–99)
Glucose-Capillary: 182 mg/dL — ABNORMAL HIGH (ref 70–99)

## 2020-09-13 LAB — BASIC METABOLIC PANEL
Anion gap: 9 (ref 5–15)
BUN: 11 mg/dL (ref 8–23)
CO2: 24 mmol/L (ref 22–32)
Calcium: 8.3 mg/dL — ABNORMAL LOW (ref 8.9–10.3)
Chloride: 99 mmol/L (ref 98–111)
Creatinine, Ser: 1.04 mg/dL (ref 0.61–1.24)
GFR, Estimated: >60 mL/min (ref >60)
Glucose, Bld: 159 mg/dL — ABNORMAL HIGH (ref 70–99)
Potassium: 3.6 mmol/L (ref 3.5–5.1)
Sodium: 132 mmol/L — ABNORMAL LOW (ref 135–145)

## 2020-09-13 NOTE — Progress Notes (Addendum)
Family Medicine Teaching Service Daily Progress Note Intern Pager: [phone]  Patient name: Thomas [name] Medical record number: [id number] Date of birth: July 04, 1955 Age: 65 y.o. Gender: male  Primary Care Provider: Benito Mccreedy, MD Consultants: GI, gen surgery, dietition  Code Status: Full Code   Pt Overview and Major Events to Date:  Hospital Day: 11 09/04/2020: admitted for Chest Pain and Abdominal Pain 4/29- Colonoscopy with finding of likely malignant completely obstructing tumor in transverse colon 5/2- Biopsy results showedInvasive Adenocarcinoma in Transverse Colon and Tubular Adenoma rectal polyp 5/4- Right Hemicolectomy  Assessment and Plan: Thomas [name] is a 65 y.o. male who presented w/ abdominal pain dx'ed with malignant tumor of the transverse colon, POD #4. PMHx s/f DMII w/ polyneuropathy, HTN, history of ischemic left MCA CVA 2015, epilepsy frontal meningioma s/p lobectomy  Invasive Adenocarcinomaoftransverse colon POD #4 Right Hemicolectomy Transitioned to clears and tolerating well. Hemoglobin stable at 9.4. Pain well controlled with oxycodone and hydromorphone PRN. Last hydromorphone dose yesterday at 1700. Patient is increasing PO fluid intake, about 1 L all day yesterday.  Colostomy site appears clean and intact.   . General surgery following, appreciate recs o Continue entereg x 14 days  o Advance diet per surgery  - Continue LR 125cc/hr, anticipate DC with good PO fluids o WOC  o Pain management: acetominophen 1000mg  q6hrs, methocarbamol 500mg  q8H, dilaudid 0.5-1mg  q3 PRN, Oxy IR 5-10 mg q4 PRN . Daily CBC w/ diff and BMP  . PT/OT with no recommendations for discharge  . Anticipate discharge 09/15/20?   Left eye irritation  Patient reports this started a few days ago. He notes increased tears and feelings of grittniess. No changes in vision, pain or pain with EOM. Mild conjuctival injection noted bilaterally on exam. PERRLA, EOMI. Likely viral  conjuctivitis. . Continue to monitor  Eye wash + Clear eyes q4 hours PRN  Patient aware to inform care team if any changes in vision/pain   AKI, resolved  . Continue LR 125 cc/hr- anticipate d/c given good PO fluid intake   HTN BP stable, last 135/74. Continue to hold home meds.  Home meds: amlodipine 10mg , lasix 20 mg, HCTZ 25mg , lisinopril 40 mg . Continue to monitor pressures, hold home meds    DM w/ peripheral neuropathy  Home meds: Metformin XR 500 mg, atorvastatin 20mg , Gabapentin 600 mg TID (per med rec at admission, on med rec as 300 mg TID) Lantus 5 units daily. CBG 161-182, at goal inpatient. Will DC QID POCT CBG and sSSI  . Continue lantus 5 daily . Continue to hold PO gabapentin, monitor for withdrawal   Peripheral Edema  No pitting edema on exam today. Home meds: lasix 20 mg  . DC LR when tolerating PO fluids   Hyponatremia, chronic Present since 2017. Asymptomatic. Marland Kitchen Continue daily BMP   Epilepsy  Home med: Keppra 1,000 mg BID . Transition to PO Keppra BID  GERD  . IV PTX 40 mg daily   FEN/GI: CLD, LR 125cc/hr, IV protonix  VTE prophylaxis: Heparin 5000 Q8H   Status is: Inpatient  Remains inpatient appropriate because:Ongoing active pain requiring inpatient pain management and Inpatient level of care appropriate due to severity of illness  Dispo: The patient is from: Home              Anticipated d/c is to: Home              Patient currently is not medically stable to d/c.   Difficult to place  patient No  Subjective:  NAEO.   Objective: Temp:  [98.4 F (36.9 C)-98.7 F (37.1 C)] 98.4 F (36.9 C) (05/08 0402) Pulse Rate:  [82-92] 82 (05/08 0402) Resp:  [18-19] 18 (05/08 0402) BP: (119-140)/(63-74) 135/74 (05/08 0402) SpO2:  [95 %-98 %] 98 % (05/08 0402) Weight:  [143.1 kg] 143.1 kg (05/08 0402) Intake/Output      05/07 0701 05/08 0700 05/08 0701 05/09 0700   P.O. 960    I.V. (mL/kg) 1000 (7)    IV Piggyback 131.7 100   Total Intake(mL/kg)  2091.7 (14.6) 100 (0.7)   Urine (mL/kg/hr) 1800 (0.5)    Stool 50    Total Output 1850    Net +241.7 +100        Urine Occurrence 2 x        Physical Exam: General: NAD, non-toxic, well-appearing, sitting comfortably in chair    HEENT: [place]/AT. PERRLA. EOMI. Mild conjunctival injection of both eyes with increased tearing of left eye. No foreign body. Cardiovascular: RRR, normal S1, S2. B/L 2+ RP. No BLEE Respiratory: CTAB. No IWOB.  Abdomen: + BS. NT, ND. Surgical site appears to be healing well, intact and clean, stables in place. Ostomy site clean and intact.  Extremities: Warm and well perfused. Moving spontaneously.  Integumentary: No obvious rashes, lesions, trauma on general exam. Neuro: A & O x4. CN grossly intact. No FND   Laboratory: I have personally read and reviewed all labs and imaging studies.  CBC: Recent Labs  Lab 09/10/20 0034 09/11/20 0255 09/12/20 0056 09/13/20 0023 09/14/20 0152  WBC 10.8*   < > 19.3* 16.0* 13.6*  NEUTROABS 7.1  --   --  12.1* 9.9*  HGB 11.4*   < > 10.1* 9.4* 9.4*  HCT 33.6*   < > 30.0* 27.5* 27.5*  MCV 74.5*   < > 75.6* 75.1* 74.7*  PLT 376   < > 380 342 376   < > = values in this interval not displayed.   CMP: Recent Labs  Lab 09/12/20 0056 09/13/20 0023 09/14/20 0152  NA 134* 132* 132*  K 4.2 3.6 3.7  CL 98 99 100  CO2 27 24 24   GLUCOSE 210* 159* 161*  BUN 21 11 6*  CREATININE 1.67* 1.04 0.97  CALCIUM 8.3* 8.3* 8.3*   CBG: Recent Labs  Lab 09/13/20 0758 09/13/20 1204 09/13/20 1654 09/13/20 2116 09/14/20 0724  GLUCAP 172* 162* 173* 182* 162*   Micro: Covid Negative   Imaging/Diagnostic Tests: No results found.  EKG Interpretation  Date/Time:  Thursday September 04 2020 06:55:10 EDT Ventricular Rate:  98 PR Interval:  164 QRS Duration: 74 QT Interval:  356 QTC Calculation: 454 R Axis:   104 Text Interpretation: Normal sinus rhythm Rightward axis Cannot rule out Anterior infarct , age undetermined Abnormal ECG  No change from previous No change from previous Confirmed by Lavenia Atlas [phone]) on 09/04/2020 11:25:55 AM        Procedures:   Procedure Orders     Procedural/ Surgical Case Request: COLONOSCOPY WITH PROPOFOL     COLONOSCOPY     EKG 12-Lead     ED EKG     EKG  Wilber Oliphant, MD 09/14/2020, 8:30 AM PGY-3, [place] Intern pager: [phone], text pages welcome

## 2020-09-13 NOTE — Progress Notes (Signed)
3 Days Post-Op  Subjective: CC: Patient reports that he does not have an appetite.  Only took in 1.5 cups of water yesterday.  No other p.o. intake.  Denies any nausea or vomiting.  Only having soreness along his midline incision.  Got to edge of bed to urinate yesterday but did not mobilize.  Unsure of flatus or if any output from colostomy bag. Voiding.   Objective: Vital signs in last 24 hours: Temp:  [97.8 F (36.6 C)-99.2 F (37.3 C)] 99.2 F (37.3 C) (05/07 0456) Pulse Rate:  [95-103] 95 (05/07 0456) Resp:  [16-17] 16 (05/07 0456) BP: (144-164)/(65-71) 144/65 (05/07 0456) SpO2:  [93 %-95 %] 95 % (05/07 0456) Last BM Date: 09/08/20  Intake/Output from previous day: 05/06 0701 - 05/07 0700 In: -  Out: 625 [Urine:625] Intake/Output this shift: No intake/output data recorded.  PE: Gen:  Alert, NAD, pleasant HEENT: EOM's intact, pupils equal and round Card:  Reg  Pulm:  Normal rate and effort  Abd: Soft, mild distension, appropriately tender around midline incision, otherwise nontender, positive bowel sounds, midline wound with staples in place, C/D/I.  Stoma edematous, pink and viable.  Colostomy bag with small amount of air and liquid in the bag. Psych: A&Ox3  Skin: no rashes noted, warm and dry   Lab Results:  Recent Labs    09/12/20 0056 09/13/20 0023  WBC 19.3* 16.0*  HGB 10.1* 9.4*  HCT 30.0* 27.5*  PLT 380 342   BMET Recent Labs    09/12/20 0056 09/13/20 0023  NA 134* 132*  K 4.2 3.6  CL 98 99  CO2 27 24  GLUCOSE 210* 159*  BUN 21 11  CREATININE 1.67* 1.04  CALCIUM 8.3* 8.3*   PT/INR No results for input(s): LABPROT, INR in the last 72 hours. CMP     Component Value Date/Time   NA 132 (L) 09/13/2020 0023   K 3.6 09/13/2020 0023   CL 99 09/13/2020 0023   CO2 24 09/13/2020 0023   GLUCOSE 159 (H) 09/13/2020 0023   BUN 11 09/13/2020 0023   CREATININE 1.04 09/13/2020 0023   CALCIUM 8.3 (L) 09/13/2020 0023   PROT 7.4 09/05/2020 0348    ALBUMIN 3.3 (L) 09/05/2020 0348   AST 14 (L) 09/05/2020 0348   ALT 21 09/05/2020 0348   ALKPHOS 82 09/05/2020 0348   BILITOT 0.8 09/05/2020 0348   GFRNONAA >60 09/13/2020 0023   GFRAA >60 04/01/2017 1921   Lipase     Component Value Date/Time   LIPASE 23 06/14/2015 1020       Studies/Results: No results found.  Anti-infectives: Anti-infectives (From admission, onward)   Start     Dose/Rate Route Frequency Ordered Stop   09/10/20 0600  cefoTEtan (CEFOTAN) 2 g in sodium chloride 0.9 % 100 mL IVPB        2 g 200 mL/hr over 30 Minutes Intravenous On call to O.R. 09/09/20 1512 09/10/20 1105       Assessment/Plan Partiallyobstructing tumor in the transverse colon. S/P open transverse colectomy with end colostomy 09/11/20 Dr. Bobbye Morton - POD#3 - continue entereg until return of bowel function - Cont CLD until tolerating more.  Only drank 1.5 cups yesterday - Mobilize, PT - did not get out of bed yesterday.  Spoke with nursing to mobilize [facility] consult for teaching on new stoma - surgical path pending  FEN - CLD. IVF VTE -SCDs, Heparin subq ID - Cefotetan    LOS: 8 days  Jillyn Ledger , [facility] Surgery 09/13/2020, 9:08 AM Please see Amion for pager number during day hours 7:00am-4:30pm

## 2020-09-14 DIAGNOSIS — Z933 Colostomy status: Secondary | ICD-10-CM | POA: Diagnosis not present

## 2020-09-14 DIAGNOSIS — C184 Malignant neoplasm of transverse colon: Secondary | ICD-10-CM | POA: Diagnosis not present

## 2020-09-14 DIAGNOSIS — E1142 Type 2 diabetes mellitus with diabetic polyneuropathy: Secondary | ICD-10-CM | POA: Diagnosis not present

## 2020-09-14 LAB — CBC WITH DIFFERENTIAL/PLATELET
Abs Immature Granulocytes: 0.26 10*3/uL — ABNORMAL HIGH (ref 0.00–0.07)
Basophils Absolute: 0.1 10*3/uL (ref 0.0–0.1)
Basophils Relative: 1 %
Eosinophils Absolute: 0.6 10*3/uL — ABNORMAL HIGH (ref 0.0–0.5)
Eosinophils Relative: 5 %
HCT: 27.5 % — ABNORMAL LOW (ref 39.0–52.0)
Hemoglobin: 9.4 g/dL — ABNORMAL LOW (ref 13.0–17.0)
Immature Granulocytes: 2 %
Lymphocytes Relative: 12 %
Lymphs Abs: 1.6 10*3/uL (ref 0.7–4.0)
MCH: 25.5 pg — ABNORMAL LOW (ref 26.0–34.0)
MCHC: 34.2 g/dL (ref 30.0–36.0)
MCV: 74.7 fL — ABNORMAL LOW (ref 80.0–100.0)
Monocytes Absolute: 1.2 10*3/uL — ABNORMAL HIGH (ref 0.1–1.0)
Monocytes Relative: 9 %
Neutro Abs: 9.9 10*3/uL — ABNORMAL HIGH (ref 1.7–7.7)
Neutrophils Relative %: 71 %
Platelets: 376 10*3/uL (ref 150–400)
RBC: 3.68 MIL/uL — ABNORMAL LOW (ref 4.22–5.81)
RDW: 14.7 % (ref 11.5–15.5)
WBC: 13.6 10*3/uL — ABNORMAL HIGH (ref 4.0–10.5)
nRBC: 0 % (ref 0.0–0.2)

## 2020-09-14 LAB — BASIC METABOLIC PANEL
Anion gap: 8 (ref 5–15)
BUN: 6 mg/dL — ABNORMAL LOW (ref 8–23)
CO2: 24 mmol/L (ref 22–32)
Calcium: 8.3 mg/dL — ABNORMAL LOW (ref 8.9–10.3)
Chloride: 100 mmol/L (ref 98–111)
Creatinine, Ser: 0.97 mg/dL (ref 0.61–1.24)
GFR, Estimated: >60 mL/min (ref >60)
Glucose, Bld: 161 mg/dL — ABNORMAL HIGH (ref 70–99)
Potassium: 3.7 mmol/L (ref 3.5–5.1)
Sodium: 132 mmol/L — ABNORMAL LOW (ref 135–145)

## 2020-09-14 LAB — GLUCOSE, CAPILLARY
Glucose-Capillary: 162 mg/dL — ABNORMAL HIGH (ref 70–99)
Glucose-Capillary: 195 mg/dL — ABNORMAL HIGH (ref 70–99)

## 2020-09-14 MED ORDER — NAPHAZOLINE-GLYCERIN 0.012-0.25 % OP SOLN
1.0000 [drp] | Freq: Four times a day (QID) | OPHTHALMIC | Status: DC | PRN
  Administered 2020-09-14 – 2020-09-15 (×5): 2 [drp] via OPHTHALMIC
  Filled 2020-09-14: qty 15

## 2020-09-14 MED ORDER — EYE WASH OPHTH SOLN
5.0000 [drp] | OPHTHALMIC | Status: DC | PRN
  Administered 2020-09-14 – 2020-09-16 (×6): 5 [drp] via OPHTHALMIC
  Filled 2020-09-14: qty 118

## 2020-09-14 MED ORDER — LEVETIRACETAM 500 MG PO TABS
1000.0000 mg | ORAL_TABLET | Freq: Two times a day (BID) | ORAL | Status: DC
  Administered 2020-09-14 – 2020-09-17 (×6): 1000 mg via ORAL
  Filled 2020-09-14 (×6): qty 2

## 2020-09-14 NOTE — Progress Notes (Addendum)
4 Days Post-Op  Subjective: CC: Patient reports he is doing better today.  Had more of an appetite yesterday.  He was able to finish most of his clear liquid diet trays without any nausea or vomiting.  Some flatus from colostomy with continued sweat/small amount of thin dark liquid in bag.  He mobilized in the room better and was up in the chair for several hours.  Voiding without difficulty.   Objective: Vital signs in last 24 hours: Temp:  [98.4 F (36.9 C)-98.7 F (37.1 C)] 98.4 F (36.9 C) (05/08 0402) Pulse Rate:  [82-92] 82 (05/08 0402) Resp:  [18-19] 18 (05/08 0402) BP: (119-140)/(63-74) 135/74 (05/08 0402) SpO2:  [95 %-98 %] 98 % (05/08 0402) Weight:  [143.1 kg] 143.1 kg (05/08 0402) Last BM Date: 09/13/20  Intake/Output from previous day: 05/07 0701 - 05/08 0700 In: 2091.7 [P.O.:960; I.V.:1000; IV Piggyback:131.7] Out: 1850 [Urine:1800; Stool:50] Intake/Output this shift: Total I/O In: 100 [IV Piggyback:100] Out: -   PE: Gen:  Alert, NAD, pleasant HEENT: EOM's intact, pupils equal and round Card:  Reg  Pulm:  Normal rate and effort  Abd: Soft, mild distension, appropriately tender around midline incision, otherwise nontender, positive bowel sounds, midline wound with staples in place, C/D/I.  Stoma edematous, pink and viable.  Colostomy bag with small amount of air and liquid in the bag. Psych: A&Ox3  Skin: no rashes noted, warm and dry  Lab Results:  Recent Labs    09/13/20 0023 09/14/20 0152  WBC 16.0* 13.6*  HGB 9.4* 9.4*  HCT 27.5* 27.5*  PLT 342 376   BMET Recent Labs    09/13/20 0023 09/14/20 0152  NA 132* 132*  K 3.6 3.7  CL 99 100  CO2 24 24  GLUCOSE 159* 161*  BUN 11 6*  CREATININE 1.04 0.97  CALCIUM 8.3* 8.3*   PT/INR No results for input(s): LABPROT, INR in the last 72 hours. CMP     Component Value Date/Time   NA 132 (L) 09/14/2020 0152   K 3.7 09/14/2020 0152   CL 100 09/14/2020 0152   CO2 24 09/14/2020 0152   GLUCOSE  161 (H) 09/14/2020 0152   BUN 6 (L) 09/14/2020 0152   CREATININE 0.97 09/14/2020 0152   CALCIUM 8.3 (L) 09/14/2020 0152   PROT 7.4 09/05/2020 0348   ALBUMIN 3.3 (L) 09/05/2020 0348   AST 14 (L) 09/05/2020 0348   ALT 21 09/05/2020 0348   ALKPHOS 82 09/05/2020 0348   BILITOT 0.8 09/05/2020 0348   GFRNONAA >60 09/14/2020 0152   GFRAA >60 04/01/2017 1921   Lipase     Component Value Date/Time   LIPASE 23 06/14/2015 1020       Studies/Results: No results found.  Anti-infectives: Anti-infectives (From admission, onward)   Start     Dose/Rate Route Frequency Ordered Stop   09/10/20 0600  cefoTEtan (CEFOTAN) 2 g in sodium chloride 0.9 % 100 mL IVPB        2 g 200 mL/hr over 30 Minutes Intravenous On call to O.R. 09/09/20 1512 09/10/20 1105       Assessment/Plan Partiallyobstructing tumor in the transverse colon. S/P open transverse colectomy with end colostomy 09/11/20 Dr. Bobbye Morton - POD#4 - CEA 1.7. CT chest without obvious metastatic disease.  There is a right upper lobe nodule that is recommended for surveillance. - continue entereg until return of bowel function - Adv to FLD.  - Mobilize, PT  - WOC consult for teaching on new stoma -  surgical path pending  FEN - FLD. IVF VTE -SCDs, Heparin subq ID - Cefotetan    LOS: 9 days    Jillyn Ledger , [facility] Surgery 09/14/2020, 8:52 AM Please see Amion for pager number during day hours 7:00am-4:30pm

## 2020-09-14 NOTE — Progress Notes (Signed)
Pt is awake in bed; AOX4. Vitals stable. Sats stable on room air. Denies nausea. Pt reports pain at surgical site of 6/10 in intensity. Medicated per MAR. Repositioned with pillow to his side. Shift assessment complete. IVF infusing without complications. Immediate needs addressed. Bed in lowest position with call light within reach and 3 side rails up. Will continue to assess. Bed alarm on.

## 2020-09-15 LAB — CBC WITH DIFFERENTIAL/PLATELET
Abs Immature Granulocytes: 0.37 10*3/uL — ABNORMAL HIGH (ref 0.00–0.07)
Basophils Absolute: 0.1 10*3/uL (ref 0.0–0.1)
Basophils Relative: 1 %
Eosinophils Absolute: 0.6 10*3/uL — ABNORMAL HIGH (ref 0.0–0.5)
Eosinophils Relative: 4 %
HCT: 28.7 % — ABNORMAL LOW (ref 39.0–52.0)
Hemoglobin: 9.8 g/dL — ABNORMAL LOW (ref 13.0–17.0)
Immature Granulocytes: 3 %
Lymphocytes Relative: 14 %
Lymphs Abs: 1.9 10*3/uL (ref 0.7–4.0)
MCH: 25.6 pg — ABNORMAL LOW (ref 26.0–34.0)
MCHC: 34.1 g/dL (ref 30.0–36.0)
MCV: 74.9 fL — ABNORMAL LOW (ref 80.0–100.0)
Monocytes Absolute: 1.3 10*3/uL — ABNORMAL HIGH (ref 0.1–1.0)
Monocytes Relative: 9 %
Neutro Abs: 9.7 10*3/uL — ABNORMAL HIGH (ref 1.7–7.7)
Neutrophils Relative %: 69 %
Platelets: 431 10*3/uL — ABNORMAL HIGH (ref 150–400)
RBC: 3.83 MIL/uL — ABNORMAL LOW (ref 4.22–5.81)
RDW: 14.9 % (ref 11.5–15.5)
WBC: 14 10*3/uL — ABNORMAL HIGH (ref 4.0–10.5)
nRBC: 0.1 % (ref 0.0–0.2)

## 2020-09-15 LAB — BASIC METABOLIC PANEL
Anion gap: 6 (ref 5–15)
BUN: 7 mg/dL — ABNORMAL LOW (ref 8–23)
CO2: 25 mmol/L (ref 22–32)
Calcium: 8.7 mg/dL — ABNORMAL LOW (ref 8.9–10.3)
Chloride: 103 mmol/L (ref 98–111)
Creatinine, Ser: 1.04 mg/dL (ref 0.61–1.24)
GFR, Estimated: >60 mL/min (ref >60)
Glucose, Bld: 156 mg/dL — ABNORMAL HIGH (ref 70–99)
Potassium: 3.9 mmol/L (ref 3.5–5.1)
Sodium: 134 mmol/L — ABNORMAL LOW (ref 135–145)

## 2020-09-15 MED ORDER — OXYCODONE HCL 5 MG/5ML PO SOLN
5.0000 mg | Freq: Four times a day (QID) | ORAL | Status: DC | PRN
  Administered 2020-09-15 – 2020-09-16 (×3): 10 mg via ORAL
  Filled 2020-09-15 (×3): qty 10

## 2020-09-15 MED ORDER — LACTATED RINGERS IV BOLUS
1000.0000 mL | Freq: Once | INTRAVENOUS | Status: AC
  Administered 2020-09-15: 1000 mL via INTRAVENOUS

## 2020-09-15 MED ORDER — LISINOPRIL 20 MG PO TABS
20.0000 mg | ORAL_TABLET | Freq: Every day | ORAL | Status: DC
  Administered 2020-09-15: 20 mg via ORAL
  Filled 2020-09-15: qty 1

## 2020-09-15 MED ORDER — PANTOPRAZOLE SODIUM 40 MG PO TBEC
40.0000 mg | DELAYED_RELEASE_TABLET | Freq: Every day | ORAL | Status: DC
  Administered 2020-09-16 – 2020-09-17 (×2): 40 mg via ORAL
  Filled 2020-09-15 (×2): qty 1

## 2020-09-15 MED ORDER — HYDROMORPHONE HCL 1 MG/ML IJ SOLN: 0.5000 mg | Freq: Four times a day (QID) | INTRAMUSCULAR | Status: DC | PRN

## 2020-09-15 NOTE — Progress Notes (Signed)
Physical Therapy Treatment Patient Details Name: Thomas [name] MRN: [id number] DOB: July 03, 1955 Today's Date: 09/15/2020    History of Present Illness 65 y/o male presented to ED on 4/28 with complaints of chest pain and abdominal pain. CT concering for possible malignancy of large bowel. 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 On 5/4, pt underwent exploratory laparotomy, mobilization of the splenic flexure, transverse colectomy and end colostomy. PMH: epilepsy, Type II DM, HTN, frontal meningioma s/p lobectomy, hx of acute respiratory failure with hypoxia.    PT Comments    Patient received in recliner, very pleasant and cooperative; does seem to have some mild STM as he does not remember walking in the hallway with PT a few days ago. Able to mobilize well but requests RW due to anxiety over mobility- did well and able to mobilize at a S level without difficulty. Left up in recliner with all needs met. Still need to practice steps before DC.     Follow Up Recommendations  No PT follow up     Equipment Recommendations  None recommended by PT    Recommendations for Other Services       Precautions / Restrictions Precautions Precautions: Fall;Other (comment) Precaution Comments: colostomy; Pt. reports 2 falls in the past 6 months Restrictions Weight Bearing Restrictions: No    Mobility  Bed Mobility               General bed mobility comments: up in recliner    Transfers Overall transfer level: Needs assistance Equipment used: Rolling walker (2 wheeled) Transfers: Sit to/from Stand Sit to Stand: Supervision         General transfer comment: S/VC for hand placement/safety, no physical assist given  Ambulation/Gait Ambulation/Gait assistance: Supervision Gait Distance (Feet): 160 Feet Assistive device: Rolling walker (2 wheeled) Gait Pattern/deviations: WFL(Within Functional Limits) Gait velocity: decreased    General Gait Details: wide BOS with tendency to look down at the floor but responded well to cues. Close S but safe and steady with RW   Stairs             Wheelchair Mobility    Modified Rankin (Stroke Patients Only)       Balance Overall balance assessment: Mild deficits observed, not formally tested;History of Falls                                          Cognition Arousal/Alertness: Awake/alert Behavior During Therapy: WFL for tasks assessed/performed Overall Cognitive Status: Within Functional Limits for tasks assessed                                 General Comments: reports mild cognitive deficits due to tumor resection in 2017; very pleasant, follows all directions but could not remember walking in the hallway just a few days ago      Exercises      General Comments        Pertinent Vitals/Pain Pain Assessment: No/denies pain Faces Pain Scale: No hurt Pain Intervention(s): Limited activity within patient's tolerance;Monitored during session    Home Living                      Prior Function            PT Goals (current  goals can now be found in the care plan section) Acute Rehab PT Goals Patient Stated Goal: go home when able PT Goal Formulation: With patient Time For Goal Achievement: 09/25/20 Potential to Achieve Goals: Good Progress towards PT goals: Progressing toward goals    Frequency    Min 3X/week      PT Plan Current plan remains appropriate    Co-evaluation              AM-PAC PT "6 Clicks" Mobility   Outcome Measure  Help needed turning from your back to your side while in a flat bed without using bedrails?: None Help needed moving from lying on your back to sitting on the side of a flat bed without using bedrails?: None Help needed moving to and from a bed to a chair (including a wheelchair)?: None Help needed standing up from a chair using your arms (e.g., wheelchair or  bedside chair)?: None Help needed to walk in hospital room?: A Little Help needed climbing 3-5 steps with a railing? : A Little 6 Click Score: 22    End of Session Equipment Utilized During Treatment: Gait belt Activity Tolerance: Patient tolerated treatment well Patient left: in chair;with call bell/phone within reach Nurse Communication: Mobility status PT Visit Diagnosis: Muscle weakness (generalized) (M62.81);Unsteadiness on feet (R26.81)     Time: 0141-0301 PT Time Calculation (min) (ACUTE ONLY): 17 min  Charges:  $Gait Training: 8-22 mins                     Windell Norfolk, DPT, PN1   Supplemental Physical Therapist [place]    Pager [phone] Acute Rehab Office [phone]

## 2020-09-15 NOTE — Progress Notes (Signed)
Pt is awake in chair; AOX4. Vitals stable. Sats stable on room air. Denies nausea. Pt reports pain at surgical site of 5/10 in intensity. Medicated per MAR. Repositioned with pillow. Shift assessment complete. Immediate needs addressed. Bed in lowest position with call light within reach. Will continue to assess.   Pt states would like to plan for discharge Wednesday morning, wife can pick him up then. Pt requests to work with PT tomorrow. Will notify oncoming shift.

## 2020-09-15 NOTE — Plan of Care (Signed)

## 2020-09-15 NOTE — Progress Notes (Addendum)
5 Days Post-Op  Subjective: CC: Patient reports he is doing better today.  Some soreness around his midline incision but otherwise no pain.  Tolerating full liquid diet without any nausea or vomiting.  Having colostomy output.  Mobilizing in room.  Voiding.  Objective: Vital signs in last 24 hours: Temp:  [98 F (36.7 C)-98.5 F (36.9 C)] 98 F (36.7 C) (05/09 0417) Pulse Rate:  [74-79] 74 (05/09 0417) Resp:  [16-19] 16 (05/09 0417) BP: (141-149)/(72-75) 146/75 (05/09 0417) SpO2:  [97 %-99 %] 97 % (05/09 0417) Weight:  [143.2 kg] 143.2 kg (05/09 0404) Last BM Date: 09/14/20  Intake/Output from previous day: 05/08 0701 - 05/09 0700 In: 1843.5 [P.O.:420; I.V.:1022.6; IV Piggyback:400.9] Out: 950 [Urine:600; Stool:350] Intake/Output this shift: No intake/output data recorded.  PE: Gen: Alert, NAD, pleasant HEENT: EOM's intact, pupils equal and round Card:Reg Pulm:Normal rate and effort Abd: Soft,mild distension,appropriately tender around midline incision, otherwise nontender, positive bowel sounds, midline wound with staples in place, C/D/I. Stoma edematous, pink and viable. Colostomy bag with small amount of air and liquid stool in the bag. Psych: A&Ox3  Skin: no rashes noted, warm and dry  Lab Results:  Recent Labs    09/14/20 0152 09/15/20 0223  WBC 13.6* 14.0*  HGB 9.4* 9.8*  HCT 27.5* 28.7*  PLT 376 431*   BMET Recent Labs    09/14/20 0152 09/15/20 0223  NA 132* 134*  K 3.7 3.9  CL 100 103  CO2 24 25  GLUCOSE 161* 156*  BUN 6* 7*  CREATININE 0.97 1.04  CALCIUM 8.3* 8.7*   PT/INR No results for input(s): LABPROT, INR in the last 72 hours. CMP     Component Value Date/Time   NA 134 (L) 09/15/2020 0223   K 3.9 09/15/2020 0223   CL 103 09/15/2020 0223   CO2 25 09/15/2020 0223   GLUCOSE 156 (H) 09/15/2020 0223   BUN 7 (L) 09/15/2020 0223   CREATININE 1.04 09/15/2020 0223   CALCIUM 8.7 (L) 09/15/2020 0223   PROT 7.4 09/05/2020 0348    ALBUMIN 3.3 (L) 09/05/2020 0348   AST 14 (L) 09/05/2020 0348   ALT 21 09/05/2020 0348   ALKPHOS 82 09/05/2020 0348   BILITOT 0.8 09/05/2020 0348   GFRNONAA >60 09/15/2020 0223   GFRAA >60 04/01/2017 1921   Lipase     Component Value Date/Time   LIPASE 23 06/14/2015 1020       Studies/Results: No results found.  Anti-infectives: Anti-infectives (From admission, onward)   Start     Dose/Rate Route Frequency Ordered Stop   09/10/20 0600  cefoTEtan (CEFOTAN) 2 g in sodium chloride 0.9 % 100 mL IVPB        2 g 200 mL/hr over 30 Minutes Intravenous On call to O.R. 09/09/20 1512 09/10/20 1105       Assessment/Plan Partiallyobstructing tumor in the transverse colon. S/P open transverse colectomy with end colostomy 09/11/20 Dr. Bobbye Morton - POD#4 - CEA 1.7. CT chest without obvious metastatic disease.  There is a right upper lobe nodule that is recommended for surveillance. - d/c entereg w/ return of bowel function today - Adv to soft diet.  Starleen Blue, PT - WOC consult for teaching on new stoma - reports his wife will assist at home. Written for [facility] and referral to ostomy clinic - surgical path pending  FEN -FLD.IVF VTE -SCDs, Heparin subq ID - Cefotetan   LOS: 10 days    Jillyn Ledger , [facility]  09/15/2020, 9:02 AM Please see Amion for pager number during day hours 7:00am-4:30pm

## 2020-09-15 NOTE — Progress Notes (Signed)
Pt is awake in chair; AOX4. Vitals stable. Sats stable on room air. Denies nausea. Pt reports pain at surgical site of 5/10 in intensity. Medicated per MAR. Repositioned with pillow. Shift assessment complete. IVF infusing without complications. Immediate needs addressed. Bed in lowest position with call light within reach. Will continue to assess.

## 2020-09-15 NOTE — Progress Notes (Addendum)
Family Medicine Teaching Service Daily Progress Note Intern Pager: [phone]  Patient name: Thomas [name] Medical record number: [id number] Date of birth: 1955/11/25 Age: 65 y.o. Gender: male  Primary Care Provider: Benito Mccreedy, MD Consultants: Gen Surgery Code Status: Full  Pt Overview and Major Events to Date:  Hospital Day: 12 09/04/2020: admitted for Chest Pain and Abdominal Pain 4/29- Colonoscopy with finding of likely malignant completely obstructing tumor in transverse colon 5/2- Biopsy results showedInvasive Adenocarcinoma in Transverse Colon and Tubular Adenoma rectal polyp 5/4- Right Hemicolectomy  Assessment and Plan: [name] is a 65 y.o. male who presented w/ abdominal pain dx'ed with malignant tumor of the transverse colon, POD #5. PMHx s/f T2DM w/ polyneuropathy, HTN, epilepsy, and frontal meningioma s/p lobectomy in 2018.  Invasive Adenocarcinomaoftransverse colon POD #5Right Hemicolectomy POD #5.  Hemoglobin is increased to 9.8 from 9.4. WBC is 14 increased from 13.6. Patient is afebrile. Incision site is healing well and pain is controlled, requiring 45mg  of Oxycodone over the last 24hrs.We will taper Oxycodone today and discontinue Robaxin. Surgery following, recommendations appreciated. Likely discharge in the next 24-48hrs if stable from General Surgery. --discontinue Robaxin --transition to Oxycodone 5-10mg  Q6H prn  --order DME walker --AM CBC Per Surgery: -- d/c entereg w/ return of bowel function today --Adv to soft diet. --scheduled Acetaminophen 650mg  Q6H --Mobilize, PT -- WOC consult for teaching on new stoma - reports his wife will assist at home. Written for [facility] and referral to ostomy clinic  HTN BP has been in the 140s/70s. Pressures have been in the 130s/70s during admission. Given that pressures are starting to increase, we can restart an ACEi and discontinue IVF. Scr is 1.04 and stable. He will need continued outpatient management.    Home meds: amlodipine 10mg , lasix 20 mg, HCTZ 25mg , lisinopril 40 mg --discontinue IV LR --AM BMP --start Lisinopril 20mg   DM w/ peripheral neuropathy   BMP Glucose 156. CBGs have been below 200 since starting basal insulin. Given he is tolerating FLD and other PO meds, we plan to transition to PO Metformin tomorrow. -- continue Lantus 5 units   Left Eye Irritation  He still has uncomfortable sensation in eyes. No pain or discharge. States that eye wash and drops are helping.  --continue Eye wash ophthalmic solution prn --continue Clear eyes 1-2 drops prn   Peripheral Edema  No peripheral edema noted on exam. Home medications include furosemide, was held during admission. --continue holding Furosemide  Epilepsy, stable Switched back to PO keppra 1000mg  on 5/8. --continue PO Keppra   GERD, stable Taking IV Protonix post surgery. As he is tolerating PO diet, we will transition to PO.  --PO Protonix 40mg      FEN/GI: Full Liquid Diet PPx: PO protonix 40mg  ID: Cefotan    Status is: Inpatient  Remains inpatient appropriate because:Inpatient level of care appropriate due to severity of illness   Dispo: The patient is from: Home              Anticipated d/c is to: Home              Patient currently is medically stable to d/c.   Difficult to place patient No        Subjective:  Mr. Captain reports that he is overall doing well. Pain is adequately controlled and he has been tolerating his diet. He reports being mobile and using incentive spirometry. He is very appreciative of his care. He does not have any complaints today.  Objective: Temp:  [98 F (36.7 C)-98.5 F (36.9 C)] 98 F (36.7 C) (05/09 0417) Pulse Rate:  [74-79] 74 (05/09 0417) Resp:  [16-19] 16 (05/09 0417) BP: (141-149)/(72-75) 146/75 (05/09 0417) SpO2:  [97 %-99 %] 97 % (05/09 0417) Weight:  [143.2 kg] 143.2 kg (05/09 0404) Physical Exam: General: Sitting up comfortably in chair HEENT: EOM  intact. Normal sclera, no conjunctival injection. Cardiovascular: Regular rate and rhythm Respiratory: No work of breathing, occasional cough.  Abdomen: Well healing, Mid-line incision with staples, no erythematous. Pink stoma attached to colostomy bag with liquid brown output. Normoactive bowel sounds. Non-tender to palpation Extremities: No peripheral edema noted  Laboratory: Recent Labs  Lab 09/13/20 0023 09/14/20 0152 09/15/20 0223  WBC 16.0* 13.6* 14.0*  HGB 9.4* 9.4* 9.8*  HCT 27.5* 27.5* 28.7*  PLT 342 376 431*   Recent Labs  Lab 09/13/20 0023 09/14/20 0152 09/15/20 0223  NA 132* 132* 134*  K 3.6 3.7 3.9  CL 99 100 103  CO2 24 24 25   BUN 11 6* 7*  CREATININE 1.04 0.97 1.04  CALCIUM 8.3* 8.3* 8.7*  GLUCOSE 159* 161* 156*     Imaging/Diagnostic Tests:   Jacklynn Bue, Medical Student 09/15/2020, 7:37 AM MS4, [place] Intern pager: [phone], text pages welcome  FPTS Upper-Level Resident Addendum   I have independently interviewed and examined the patient. I have discussed the above with the original author and agree with their documentation. Please see also any attending notes.    Carollee Leitz MD PGY-2, [facility] 09/15/2020 6:01 PM  FPTS Service pager: [phone] (text pages welcome through [place])

## 2020-09-15 NOTE — Care Management Important Message (Signed)
Important Message  Patient Details  Name: Thomas [name] MRN: [id number] Date of Birth: Oct 19, 1955   Medicare Important Message Given:  Yes     [place] 09/15/2020, 3:23 PM

## 2020-09-16 LAB — CBC
HCT: 29.6 % — ABNORMAL LOW (ref 39.0–52.0)
Hemoglobin: 10.4 g/dL — ABNORMAL LOW (ref 13.0–17.0)
MCH: 25.9 pg — ABNORMAL LOW (ref 26.0–34.0)
MCHC: 35.1 g/dL (ref 30.0–36.0)
MCV: 73.6 fL — ABNORMAL LOW (ref 80.0–100.0)
Platelets: 445 10*3/uL — ABNORMAL HIGH (ref 150–400)
RBC: 4.02 MIL/uL — ABNORMAL LOW (ref 4.22–5.81)
RDW: 15 % (ref 11.5–15.5)
WBC: 13.4 10*3/uL — ABNORMAL HIGH (ref 4.0–10.5)
nRBC: 0.2 % (ref 0.0–0.2)

## 2020-09-16 LAB — BASIC METABOLIC PANEL
Anion gap: 8 (ref 5–15)
BUN: 7 mg/dL — ABNORMAL LOW (ref 8–23)
CO2: 25 mmol/L (ref 22–32)
Calcium: 8.9 mg/dL (ref 8.9–10.3)
Chloride: 101 mmol/L (ref 98–111)
Creatinine, Ser: 1.04 mg/dL (ref 0.61–1.24)
GFR, Estimated: >60 mL/min (ref >60)
Glucose, Bld: 156 mg/dL — ABNORMAL HIGH (ref 70–99)
Potassium: 3.5 mmol/L (ref 3.5–5.1)
Sodium: 134 mmol/L — ABNORMAL LOW (ref 135–145)

## 2020-09-16 MED ORDER — METFORMIN HCL ER 500 MG PO TB24
500.0000 mg | ORAL_TABLET | Freq: Two times a day (BID) | ORAL | Status: DC
  Administered 2020-09-16 – 2020-09-17 (×2): 500 mg via ORAL
  Filled 2020-09-16 (×2): qty 1

## 2020-09-16 MED ORDER — LISINOPRIL 40 MG PO TABS
40.0000 mg | ORAL_TABLET | Freq: Every day | ORAL | Status: DC
  Administered 2020-09-16 – 2020-09-17 (×2): 40 mg via ORAL
  Filled 2020-09-16 (×2): qty 1

## 2020-09-16 NOTE — Progress Notes (Signed)
Family Medicine Teaching Service Daily Progress Note Intern Pager: [phone]  Patient name: Thomas [name] Medical record number: [id number] Date of birth: [birth year] Age: 65 y.o. Gender: male  Primary Care Provider: Benito Mccreedy, MD Consultants: General Surgery Code Status: FULL  Pt Overview and Major Events to Date:  09/04/2020: admitted forChest Pain and Abdominal Pain 4/29- Colonoscopy with finding of likely malignant completely obstructing tumor in transverse colon 5/2- Biopsy results showedInvasive Adenocarcinoma in Transverse Colon and Tubular Adenoma rectal polyp 5/4- Right Hemicolectomy  Assessment and Plan: Thomas [name] a 65 y.o.malewho presented w/ abdominal pain dx'ed with malignant tumor of the transverse colon, POD #5. PMHx s/fT2DM w/ polyneuropathy, HTN, epilepsy, and frontal meningioma s/p lobectomy in 2018.  Invasive Adenocarcinomaoftransverse colon POD #5Right Hemicolectomy POD #6.  Hemoglobin stable. WBC 14>13.4. Afebrile. Incision site is healing well.  Pain is controlled, patient is tolerating decrease in his pain medication.  Took 40 mg total of oxycodone yesterday (and only 10 mg so far today).  --Continue Oxy IR 5-10mg  Q6H prn  Per Surgery: --Continue soft diet. --scheduled Acetaminophen 650mg  Q6H --Mobilize, PT --  Patient performed pouch removal, pouch system preparation and pouch placement process with wound care nurse today  HTN: Chronic, stable BP's have been increasing, most recently 154/80. Was started on Lisinopril 20 mg yesterday. Home meds: amlodipine 10mg , lasix 20 mg, HCTZ 25mg , lisinopril 40 mg --Increase Lisinopril to 40mg   T2DM w/ peripheral neuropathy   BMP Glucose 156. CBGs have been below 200 since starting basal insulin. Creatinine stable from yesterday.  -Restart home metformin  Peripheral Edema Mild peripheral edema noted on exam. Home medications include furosemide, was held during admission.  Epilepsy,  stable Switched back to PO keppra 1000mg  on 5/8. --continue PO Keppra   GERD, stable.  --Continue PO Protonix 40mg     FEN/GI: Soft Diet PPx: Heparin  Status is: Inpatient  Remains inpatient appropriate because:Unsafe d/c plan   Dispo: The patient is from: Home              Anticipated d/c is to: Home              Patient currently is not medically stable to d/c.  Plan for discharge tomorrow   Difficult to place patient No   Subjective:  Patient feels generally overwhelmed with care for his ostomy.  He does feel really well supported by his wife and by his church.  States that there is a retired [employer] scientist at his church who may be able to help him at home, along with his wife and the home health nurse.  He worked with wound care for ostomy change this morning, was told that he did well but he personally feels that he struggled due to his "cognitive deficits".  He was able to tolerate some food yesterday, only having pain around his incision site.  Feels that he will be able to go to home tomorrow as his wife has medical appointments today and would be more ready to receive him tomorrow.   Objective: Temp:  [98.3 F (36.8 C)-98.6 F (37 C)] 98.3 F (36.8 C) (05/10 0623) Pulse Rate:  [74-80] 77 (05/10 0623) Resp:  [15-19] 16 (05/10 0623) BP: (154-168)/(78-84) 154/80 (05/10 0623) SpO2:  [97 %-99 %] 97 % (05/10 9381) Physical Exam: General: Awake, alert, pleasant Cardiovascular: RRR, holosystolic murmur Respiratory: Coarse breath sounds Abdomen: Obese, ostomy bag to right abdomen, normoactive bowel sounds, staples in place without erythema, tenderness only around area of incision Extremities: 1+ pitting  edema bilateral lower extremities  Laboratory: Recent Labs  Lab 09/14/20 0152 09/15/20 0223 09/16/20 0104  WBC 13.6* 14.0* 13.4*  HGB 9.4* 9.8* 10.4*  HCT 27.5* 28.7* 29.6*  PLT 376 431* 445*   Recent Labs  Lab 09/14/20 0152 09/15/20 0223 09/16/20 0104  NA 132* 134*  134*  K 3.7 3.9 3.5  CL 100 103 101  CO2 24 25 25   BUN 6* 7* 7*  CREATININE 0.97 1.04 1.04  CALCIUM 8.3* 8.7* 8.9  GLUCOSE 161* 156* 156*     Imaging/Diagnostic Tests: No results found.   Sharion Settler, DO 09/16/2020, 9:32 AM PGY-1, [place] Intern pager: [phone], text pages welcome

## 2020-09-16 NOTE — TOC Transition Note (Signed)
Transition of Care [facility]) - CM/SW Discharge Note   Patient Details  Name: Thomas [name] MRN: [id number] Date of Birth: 1956/03/23  Transition of Care [facility]) CM/SW Contact:  Carles Collet, RN Phone Number: 09/16/2020, 3:03 PM   Clinical Narrative:    Damaris Schooner w patient at bedside. Plan to DC to home w colostomy.   RW ordered and requested to room for potential DC tomorrow. HH RN has been set up with Alvis Lemmings      Barriers to Discharge: Continued Medical Work up   Patient Goals and CMS Choice Patient states their goals for this hospitalization and ongoing recovery are:: to go home w wife CMS Medicare.gov Compare Post Acute Care list provided to:: Patient Choice offered to / list presented to : Patient  Discharge Placement                       Discharge Plan and Services   Discharge Planning Services: CM Consult Post Acute Care Choice: Home Health,Durable Medical Equipment          DME Arranged: Walker rolling DME Agency: [employer] Date DME Agency Contacted: 09/16/20 Time DME Agency Contacted: [phone] Representative spoke with at DME Agency: [place]: RN [place] Agency: [place] Date [place]: 09/16/20 Time [place]: [phone] Representative spoke with at [place]: [place] ([place]) Interventions     Readmission Risk Interventions No flowsheet data found.

## 2020-09-16 NOTE — Consult Note (Signed)
[place] Nurse ostomy follow up Patient receiving care in [facility] [phone]. Sitting up in recliner, fully interactive with me. Stoma type/location: RUQ colostomy Stomal assessment/size: 2 1/4 inches, moist, sutures intact, producing liquid green/brown stool--75 ml in existing pouch Peristomal assessment: intact Treatment options for stomal/peristomal skin: barrier ring Output as above Ostomy pouching: 2pc. Flat with barrier ring (Pouch [place] 649; skin barrier Kellie Simmering 2; barrier ring Kellie Simmering [phone]) Education provided: Patient performed the entire emptying, pouch removal, pouch system preparation and pouch placement process.  I instructed him through each step.  He was able to perform each step with minimal assistance. Enrolled patient in [place] Start Discharge program: Yes [place] education folder provided and reviewed with the patient.  This concludes the ostomy teaching sessions with the patient.  The surgical PA came in at the beginning of my education session with the patient. I understand he is going home tomorrow if all aspects remain stable, with home health. We talked about how [place] will be able to continue the ostomy teaching with him and his wife.  I requested additional supplies and entered an order to send all ostomy supplies home with the patient at time of discharge. Val Riles, RN, MSN, CWOCN, CNS-BC, pager [phone]

## 2020-09-16 NOTE — Progress Notes (Signed)
6 Days Post-Op  Subjective: CC: Patient reports he is doing well. Tolerating diet but did not eat much yesterday (applesauce and chicken noodle soup). He is having some soreness around his incision that is controlled with medications. No other pain. Denies n/v. Having colostomy output. Patient performed the entire emptying, pouch removal, pouch system preparation and pouch placement process with WOCN this am.    Objective: Vital signs in last 24 hours: Temp:  [98.3 F (36.8 C)-98.6 F (37 C)] 98.3 F (36.8 C) (05/10 0623) Pulse Rate:  [74-80] 77 (05/10 0623) Resp:  [15-19] 16 (05/10 0623) BP: (154-168)/(78-84) 154/80 (05/10 0623) SpO2:  [97 %-99 %] 97 % (05/10 0623) Last BM Date: 09/15/20  Intake/Output from previous day: 05/09 0701 - 05/10 0700 In: 240 [P.O.:240] Out: 2325 [Urine:1500; Stool:825] Intake/Output this shift: No intake/output data recorded.  PE: Gen: Alert, NAD, pleasant HEENT: EOM's intact, pupils equal and round Card:Reg Pulm:Normal rate and effort Abd: Soft,mild distension,appropriately tender around midline incision, otherwise nontender, positive bowel sounds, midline wound with staples in place, C/D/I. Stoma edematous, pink and viable. Colostomy bag with small amount of air and liquid stool in the bag. Psych: A&Ox3  Skin: no rashes noted, warm and dry  Lab Results:  Recent Labs    09/15/20 0223 09/16/20 0104  WBC 14.0* 13.4*  HGB 9.8* 10.4*  HCT 28.7* 29.6*  PLT 431* 445*   BMET Recent Labs    09/15/20 0223 09/16/20 0104  NA 134* 134*  K 3.9 3.5  CL 103 101  CO2 25 25  GLUCOSE 156* 156*  BUN 7* 7*  CREATININE 1.04 1.04  CALCIUM 8.7* 8.9   PT/INR No results for input(s): LABPROT, INR in the last 72 hours. CMP     Component Value Date/Time   NA 134 (L) 09/16/2020 0104   K 3.5 09/16/2020 0104   CL 101 09/16/2020 0104   CO2 25 09/16/2020 0104   GLUCOSE 156 (H) 09/16/2020 0104   BUN 7 (L) 09/16/2020 0104   CREATININE  1.04 09/16/2020 0104   CALCIUM 8.9 09/16/2020 0104   PROT 7.4 09/05/2020 0348   ALBUMIN 3.3 (L) 09/05/2020 0348   AST 14 (L) 09/05/2020 0348   ALT 21 09/05/2020 0348   ALKPHOS 82 09/05/2020 0348   BILITOT 0.8 09/05/2020 0348   GFRNONAA >60 09/16/2020 0104   GFRAA >60 04/01/2017 1921   Lipase     Component Value Date/Time   LIPASE 23 06/14/2015 1020       Studies/Results: No results found.  Anti-infectives: Anti-infectives (From admission, onward)   Start     Dose/Rate Route Frequency Ordered Stop   09/10/20 0600  cefoTEtan (CEFOTAN) 2 g in sodium chloride 0.9 % 100 mL IVPB        2 g 200 mL/hr over 30 Minutes Intravenous On call to O.R. 09/09/20 1512 09/10/20 1105       Assessment/Plan Partiallyobstructing tumor in the transverse colon. S/P open transverse colectomy with end colostomy 09/11/20 Dr. Bobbye Morton - POD#5 - CEA 1.7. CT chest withoutobvious metastatic disease. There is a right upper lobe nodule that is recommended for surveillance. - Surgical path pending - Cont  soft diet. -Mobilize, PTrec no follow up - WOC consult for teaching on new stoma. Patient performed the entire emptying, pouch removal, pouch system preparation and pouch placement process with WOCN on 5/10. He reports his wife will assist at home. Written for [facility] and referral to ostomy clinic - Suspect he will be ready for  d/c home in the AM  FEN - Soft.IVF VTE -SCDs, Heparin subq. Hgb stable at 10.4 ID - Cefotetanperiop. Afebrile. WBC down at 123.4   LOS: 11 days    Jillyn Ledger , [facility] 09/16/2020, 8:57 AM Please see Amion for pager number during day hours 7:00am-4:30pm

## 2020-09-16 NOTE — Progress Notes (Signed)
Physical Therapy Treatment Patient Details Name: Thomas [name] MRN: [id number] DOB: [birth year] Today's Date: 09/16/2020    History of Present Illness 65 y/o male presented to ED on 4/28 with complaints of chest pain and abdominal pain. CT concering for possible malignancy of large bowel. 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 On 5/4, pt underwent exploratory laparotomy, mobilization of the splenic flexure, transverse colectomy and end colostomy. PMH: epilepsy, Type II DM, HTN, frontal meningioma s/p lobectomy, hx of acute respiratory failure with hypoxia.    PT Comments    Patient received in recliner, very pleasant and very motivated to work with PT today. Tolerated progression of gait distance and introduction of steps well today with only mild fatigue. Able to toilet (urine only)/perform self care after toileting with distant S. Left up in recliner with all needs met. Progressing well, ready for DC home once medically ready from PT side.    Follow Up Recommendations  No PT follow up     Equipment Recommendations  None recommended by PT    Recommendations for Other Services       Precautions / Restrictions Precautions Precautions: Fall;Other (comment) Precaution Comments: colostomy; Pt. reports 2 falls in the past 6 months Restrictions Weight Bearing Restrictions: No    Mobility  Bed Mobility               General bed mobility comments: up in recliner    Transfers Overall transfer level: Modified independent Equipment used: None Transfers: Sit to/from Stand Sit to Stand: Modified independent (Device/Increase time)         General transfer comment: no physical assist given, good safety awareness  Ambulation/Gait Ambulation/Gait assistance: Min guard Gait Distance (Feet): 220 Feet (172fx2)   Gait Pattern/deviations: Step-through pattern;Wide base of support Gait velocity: decreased   General Gait  Details: wide BOS and slower with no assistive device, but steady with no significant LOB; tolerated progression of gait distance well   Stairs Stairs: Yes Stairs assistance: Min guard Stair Management: Two rails;Alternating pattern;Forwards Number of Stairs: 12 General stair comments: slow but steady, min guard for safety but no physical assist given   Wheelchair Mobility    Modified Rankin (Stroke Patients Only)       Balance Overall balance assessment: Mild deficits observed, not formally tested;History of Falls                                          Cognition Arousal/Alertness: Awake/alert Behavior During Therapy: WFL for tasks assessed/performed Overall Cognitive Status: Within Functional Limits for tasks assessed                                 General Comments: mild cognitive deficits at baseline after tumor resection in 2017, very pleasant and cooperative      Exercises      General Comments        Pertinent Vitals/Pain Pain Assessment: No/denies pain Faces Pain Scale: No hurt Pain Intervention(s): Limited activity within patient's tolerance;Monitored during session    Home Living                      Prior Function            PT Goals (current goals can now be found in the  care plan section) Acute Rehab PT Goals Patient Stated Goal: go home when able PT Goal Formulation: With patient Time For Goal Achievement: 09/25/20 Potential to Achieve Goals: Good Progress towards PT goals: Progressing toward goals    Frequency    Min 3X/week      PT Plan Current plan remains appropriate    Co-evaluation              AM-PAC PT "6 Clicks" Mobility   Outcome Measure  Help needed turning from your back to your side while in a flat bed without using bedrails?: None Help needed moving from lying on your back to sitting on the side of a flat bed without using bedrails?: None Help needed moving to and from  a bed to a chair (including a wheelchair)?: None Help needed standing up from a chair using your arms (e.g., wheelchair or bedside chair)?: None Help needed to walk in hospital room?: None Help needed climbing 3-5 steps with a railing? : A Little 6 Click Score: 23    End of Session Equipment Utilized During Treatment: Gait belt Activity Tolerance: Patient tolerated treatment well Patient left: in chair;with call bell/phone within reach Nurse Communication: Mobility status PT Visit Diagnosis: Muscle weakness (generalized) (M62.81);Unsteadiness on feet (R26.81)     Time: 5170-0174 PT Time Calculation (min) (ACUTE ONLY): 16 min  Charges:  $Gait Training: 8-22 mins                     Windell Norfolk, DPT, PN1   Supplemental Physical Therapist [place]    Pager [phone] Acute Rehab Office [phone]

## 2020-09-16 NOTE — Discharge Summary (Addendum)
Family Medicine Teaching [facility] Discharge Summary  Patient name: Thomas [name] Medical record number: [id number] Date of birth: 1956/01/06 Age: 65 y.o. 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 rate 15, height 5\' 8"  (1.727 m), weight (!) 143.2 kg, SpO2 98 %. Gen: Awake, alert, well-appearing and in no acute distress Cardio: Holosystolic murmur best heard at apex, regular rate  Resp: Breathing comfortably on room air, no respiratory distress, speaking in full sentences Abd: Surgical staples in place, no erythema noted around surgical site, ostomy in place right-side Extremities: 1+ pitting edema b/l lower extremities   Brief Hospital Course:  Thomas [name] is a 65 y.o. male with a history of epilepsy, T2DM, HTN, frontal meningioma s/p lobectomy who presented with abdominal pain found to have adenocarcinoma now s/p right hemicolectomy. Hospital course outlined by problem below:  Obstructive transverse colon mass Invasive Adenocarcinoma Patient presented with abdominal pain, early satiety and change in bowel movements/ stool quality. CT abdomen/pelvis concerning for 3.7 cm  apple-core type mass of the mid transverse colon.  Colonoscopy performed on 4/29 with finding of malignant completely obstructing tumor in the transverse colon.  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 Rectal polyp was also excised and sent for pathology and revealed Tubular adenoma and no high grade dysplasia or malignancy. He underwent right hemicolectomy with end colostomy on 5/4 with Gen. Surgery. Surgical pathology notable for adenocarcinoma, well differentiated and metastatic carcinoma involving one of nineteen lymph nodes (1/19). He was tolerating soft diet at discharge. Did not require opioid medication for more than 24 hours prior to d/c, thus only d/c with instructions to take OTC Tylenol for pain. He received ostomy care education. He will need outpatient follow up with Gen. Surgery and Oncology.  HTN Home medications: amlodipine 10 mg daily, HCTZ 25 mg daily, lisinopril 40 mg daily. BP medications were started on admission and then held due to NPO status for surgery. Bps remained stable. Lisinopril 20mg  was started on 5/9. He was started back on his home medications at discharge. He will need continued outpatient follow up and management.    T2DM with peripheral neuropathy Home medications include metformin 500 mg twice daily, Gabapentin 300mg   and Atorvastatin 20mg  and was held at admission. He received Lantus 5 units during admission while NPO and advancing diet. He was restarted on PO medications at discharge. He will need continued outpatient follow up and management.   Peripheral edema His home medication of Furosemide was held during admission. He did not have any peripheral edema on PE during admission.   Epilepsy He continued on home dose of Keppra 1000mg  IV or PO during admission and remained stable.   GERD Received Protonix 40mg  in either IV or PO formulation during admission.  Holosystolic murmur Noted on exam on admission, patient states this has  been known for 1 month but no work-up has been done so far. Consider outpatient workup  Follow up:  1. Right upper lobe lung nodule recommended for surveillance.  2. Gen Surgery follow up  3. Oncology outpatient follow up  4. Home HCTZ was held. Recheck BP, adjust medications as needed.   Other problems chronic and stable.   Significant Procedures: Right-hemicolectomy and end colostomy 5/4  Significant Labs and Imaging:  Recent Labs  Lab 09/14/20 0152 09/15/20 0223 09/16/20 0104  WBC 13.6* 14.0* 13.4*  HGB 9.4* 9.8* 10.4*  HCT 27.5* 28.7* 29.6*  PLT 376 431* 445*   Recent Labs  Lab 09/12/20 0056 09/13/20 0023 09/14/20 0152 09/15/20 0223 09/16/20 0104  NA 134* 132* 132* 134* 134*  K 4.2 3.6 3.7 3.9 3.5  CL 98 99 100 103 101  CO2 27 24 24 25 25   GLUCOSE 210* 159* 161* 156* 156*  BUN 21 11 6* 7* 7*  CREATININE 1.67* 1.04 0.97 1.04 1.04  CALCIUM 8.3* 8.3* 8.3* 8.7* 8.9    Results/Tests Pending at Time of Discharge:  Unresulted Labs (From admission, onward)         None       Discharge Medications:  Allergies as of 09/17/2020      Reactions   No Known Allergies       Medication List    STOP taking these medications   CORICIDIN HBP COLD/FLU PO   furosemide 20 MG tablet Commonly known as: LASIX   hydrochlorothiazide 25 MG tablet Commonly known as: HYDRODIURIL   Lacosamide 100 MG Tabs Commonly known as: Vimpat   UNABLE TO FIND     TAKE these medications   acetaminophen 500 MG tablet Commonly known as: TYLENOL Take 1,000 mg by mouth every 6 (six) hours as needed for headache (pain).   albuterol 108 (90 Base) MCG/ACT inhaler Commonly known as: VENTOLIN HFA Inhale 2 puffs into the lungs every 4 (four) hours as needed for wheezing or shortness of breath.   amLODipine 10 MG tablet Commonly known as: NORVASC Take 10 mg by mouth daily.   atorvastatin 20 MG tablet Commonly known as: LIPITOR Take 20 mg by mouth daily at 6 PM.   calcium carbonate  500 MG chewable tablet Commonly known as: TUMS - dosed in mg elemental calcium Chew 1-2 tablets by mouth 2 (two) times daily as needed for indigestion or heartburn (DEPENDS ON INDIGESTION IF TAKES 1-2 TABLETS).   gabapentin 300 MG capsule Commonly known as: Neurontin Take 1 capsule (300 mg total) by mouth 3 (three) times daily. What changed: how much to take   GAS-X PO Take 1 tablet by mouth daily as needed (stomach/gas).   levETIRAcetam 1000 MG tablet Commonly known as: KEPPRA Take 1,000 mg by mouth 2 (two) times daily.   lisinopril 40 MG tablet Commonly known as: ZESTRIL Take 40 mg by mouth daily.   metFORMIN 500 MG 24 hr tablet Commonly known as: GLUCOPHAGE-XR Take 500 mg by mouth 2 (two) times daily.   omeprazole 20 MG capsule Commonly known as: PRILOSEC Take 20 mg by mouth daily.   polyethylene glycol powder 17 GM/SCOOP powder Commonly known as: GLYCOLAX/MIRALAX Take 17 g by mouth daily as needed for mild constipation.            Durable Medical Equipment  (From admission, onward)         Start     Ordered  09/15/20 1143  For home use only DME Walker  Once       Question:  Patient needs a walker to treat with the following condition  Answer:  Unsteady gait when walking   09/15/20 1145           Discharge Care Instructions  (From admission, onward)         Start     Ordered   09/17/20 0000  Discharge wound care:       Comments: Per Surgery recommendations   09/17/20 1147          Discharge Instructions: Please refer to Patient Instructions section of EMR for full details.  Patient was counseled important signs and symptoms that should prompt return to medical care, changes in medications, dietary instructions, activity restrictions, and follow up appointments.   Follow-Up Appointments:  Follow-up Information    Surgery, [place]. Go on 09/24/2020.   Specialty: General Surgery Why: This is a nurse visit for staple removal at 11am.  Please arrive 30 minutes prior to your appointment for paperwork. Please bring a copy of your photo ID and insurance card.  Contact information: [address] [place] [address] [phone]        Diamantina Monks, MD. Go on 10/09/2020.   Specialty: Surgery Why: 855am. Please arrive 30 minutes prior to your appointment. Please bring a copy of your photo ID and insurance card.  Contact information: [address] [place] [address] [phone]        Jackie Plum, MD Follow up.   Specialty: Internal Medicine Contact information: [address] [place] [address] [phone]        Dr. Thornton Papas. Go on 09/23/2020.   Why: Please go to your scheduled appointment at 1:40. Arrive 15 minutes early. Contact information: [facility] at [facility]  [address] [place],  [place]  [address] [phone]              Sabino Dick, DO 09/17/2020, 11:58 AM PGY-1, [facility]

## 2020-09-17 NOTE — TOC Transition Note (Signed)
Transition of Care [facility]) - CM/SW Discharge Note   Patient Details  Name: Thomas [name] MRN: [id number] Date of Birth: 04-21-1956  [facility] CM/SW Contact:  Carles Collet, RN Phone Number: 09/17/2020, 1:27 PM   Clinical Narrative:   Thomas [name] of discharge. No other CM needs identified.       Barriers to Discharge: Continued Medical Work up   Patient Goals and CMS Choice Patient states their goals for this hospitalization and ongoing recovery are:: to go home w wife CMS Medicare.gov Compare Post Acute Care list provided to:: Patient Choice offered to / list presented to : Patient  Discharge Placement                       Discharge Plan and Services   Discharge Planning Services: CM Consult Post Acute Care Choice: Home Health,Durable Medical Equipment          DME Arranged: Walker rolling DME Agency: [employer] Date DME Agency Contacted: 09/16/20 Time DME Agency Contacted: [phone] Representative spoke with at DME Agency: [place]: RN [place] Agency: [place] Date [place]: 09/16/20 Time [place]: 5329 Representative spoke with at [place]: [place] ([place]) Interventions     Readmission Risk Interventions No flowsheet data found.

## 2020-09-17 NOTE — Plan of Care (Signed)

## 2020-09-17 NOTE — Discharge Instructions (Signed)
Thomas [name],   We are glad that you are feeling better. You have scheduled follow-up's with surgery and oncology. You should also call your primary care doctor to schedule an appointment for your other chronic conditions such as diabetes and high blood pressure. Take Tylenol over the counter for pain control. Avoid Ibuprofen/Non-steroidal medication for now. We have restarted your home medications with the exception of the hydrochlorothiazide (one of your blood pressure medications). Please follow up with your primary care doctor for further management, they may have you restart this medication.  Below are the surgical recommendations.  We wish you well, [facility], [place] [phone]  OPEN ABDOMINAL SURGERY: POST OP INSTRUCTIONS  Always review your discharge instruction sheet given to you by the facility where your surgery was performed.  IF YOU HAVE DISABILITY OR FAMILY LEAVE FORMS, YOU MUST BRING THEM TO THE OFFICE FOR PROCESSING.  PLEASE DO NOT GIVE THEM TO YOUR DOCTOR.  1. A prescription for pain medication may be given to you upon discharge.  Take your pain medication as prescribed, if needed.  If narcotic pain medicine is not needed, then you may take acetaminophen (Tylenol) or ibuprofen (Advil) as needed. 2. Take your usually prescribed medications unless otherwise directed. 3. If you need a refill on your pain medication, please contact your pharmacy. They will contact our office to request authorization.  Prescriptions will not be filled after 5pm or on week-ends. 4. You should follow a light diet the first few days after arrival home, such as soup and crackers, pudding, etc.unless your doctor has advised otherwise. A high-fiber, low fat diet can be resumed as tolerated.   Be sure to include lots of fluids daily. Most patients will experience some swelling and bruising on the chest and neck area.  Ice packs will help.  Swelling and  bruising can take several days to resolve 5. Most patients will experience some swelling and bruising in the area of the incision. Ice pack will help. Swelling and bruising can take several days to resolve..  6. It is common to experience some constipation if taking pain medication after surgery.  Increasing fluid intake and taking a stool softener will usually help or prevent this problem from occurring.  A mild laxative (Milk of Magnesia or Miralax) should be taken according to package directions if there are no bowel movements after 48 hours. 7.  You may have steri-strips (small skin tapes) in place directly over the incision.  These strips should be left on the skin for 7-10 days.  If your surgeon used skin glue on the incision, you may shower in 24 hours.  The glue will flake off over the next 2-3 weeks.  Any sutures or staples will be removed at the office during your follow-up visit. You may find that a light gauze bandage over your incision may keep your staples from being rubbed or pulled. You may shower and replace the bandage daily. 8. ACTIVITIES:  You may resume regular (light) daily activities beginning the next day--such as daily self-care, walking, climbing stairs--gradually increasing activities as tolerated.  You may have sexual intercourse when it is comfortable.  Refrain from any heavy lifting or straining until approved by your doctor. a. You may drive when you no longer are taking prescription pain medication, you can comfortably wear a seatbelt, and you can safely maneuver your car and apply brakes b. Return to Work: ___________________________________ 22. You  should see your doctor in the office for a follow-up appointment approximately two weeks after your surgery.  Make sure that you call for this appointment within a day or two after you arrive home to insure a convenient appointment time. OTHER INSTRUCTIONS:   _____________________________________________________________ _____________________________________________________________  WHEN TO CALL YOUR DOCTOR: 1. Fever over 101.0 2. Inability to urinate 3. Nausea and/or vomiting 4. Extreme swelling or bruising 5. Continued bleeding from incision. 6. Increased pain, redness, or drainage from the incision. 7. Difficulty swallowing or breathing 8. Muscle cramping or spasms. 9. Numbness or tingling in hands or feet or around lips.  The clinic staff is available to answer your questions during regular business hours.  Please don't hesitate to call and ask to speak to one of the nurses if you have concerns.  For further questions, please visit [email or web address]    Colostomy Home Guide, Adult  Colostomy surgery is done to create an opening in the front of the abdomen for stool (feces) to leave the body through an ostomy (stoma). Part of the large intestine is attached to the stoma. A bag, also called a pouch, is fitted over the stoma. Stool and gas will collect in the bag. After surgery, you will need to empty and change your colostomy bag as needed. You will also need to care for your stoma. How to care for the stoma Your stoma should look pink, red, and moist, like the inside of your cheek. Soon after surgery, the stoma may be swollen, but this swelling will go away within 6 weeks. To care for the stoma:  Keep the skin around the stoma clean and dry.  Use a clean, soft washcloth to gently wash the stoma and the skin around it. Clean using a circular motion, and wipe away from the stoma opening, not toward it. ? Use warm water and only use cleansers recommended by your health care provider. ? Rinse the stoma area with plain water. ? Dry the area around the stoma well.  Use stoma powder or ointment on your skin only as told by your health care provider. Do not use any other powders, gels, wipes, or creams on the skin around the  stoma.  Check the stoma area every day for signs of infection. Check for: ? New or worsening redness, swelling, or pain. ? New or increased fluid or blood. ? Pus or warmth.  Measure the stoma opening regularly and record the size. Watch for changes. (It is normal for the stoma to get smaller as swelling goes away.) Share this information with your health care provider. How to empty the colostomy bag Empty your bag at bedtime and whenever it is one-third to one-half full. Do not let the bag get more than half-full with stool or gas. The bag could leak if it gets too full. Some colostomy bags have a built-in gas release valve that releases gas often throughout the day. Follow these basic steps: 1. Wash your hands with soap and water. 2. Sit far back on the toilet seat. 3. Put several pieces of toilet paper into the toilet water. This will prevent splashing as you empty stool into the toilet. 4. Remove the clip or the hook-and-loop fastener from the tail end of the bag. 5. Unroll the tail, then empty the stool into the toilet. 6. Clean the tail with toilet paper or a moist towelette. 7. Reroll the tail, and close it with the clip or the hook-and-loop fastener. 8. Wash your hands again.  How to change the colostomy bag Change your bag every 3-4 days or as often as told by your health care provider. Also change the bag if it is leaking or separating from the skin, or if your skin around the stoma looks or feels irritated. Irritated skin may be a sign that the bag is leaking. Always have colostomy supplies with you, and follow these basic steps: 1. Wash your hands with soap and water. Have paper towels or tissues nearby to clean any discharge. 2. Remove the old bag and skin barrier. Use your fingers or a warm cloth to gently push the skin away from the barrier. 3. Clean the stoma area with water or with mild soap and water, as directed. Use water to rinse away any soap. 4. Dry the skin. You may  use the cool setting on a hair dryer to do this. 5. Use a tracing pattern (template) to cut the skin barrier to the size needed. 6. If you are using a two-piece bag, attach the bag and the skin barrier to each other. Add the barrier ring, if you use one. 7. If directed, apply stoma powder or skin barrier gel to the skin. 8. Warm the skin barrier with your hands, or blow with a hair dryer for 5-10 seconds. 9. Remove the paper from the adhesive strip of the skin barrier. 10. Press the adhesive strip onto the skin around the stoma. 11. Gently rub the skin barrier onto the skin. This creates heat that helps the barrier to stick. 12. Apply stoma tape to the edges of the skin barrier, if desired. 51. Wash your hands again. General recommendations  Avoid wearing tight clothes or having anything press directly on your stoma or bag. Change your clothing whenever it is soiled or damp.  You may shower or bathe with the bag on or off. Do not use harsh or oily soaps or lotions. Dry the skin and bag after bathing.  Store all supplies in a cool, dry place. Do not leave supplies in extreme heat because some parts can melt or not stick as well.  Whenever you leave home, take extra clothing and an extra skin barrier and bag with you.  If your bag gets wet, you can dry it with a hair dryer on the cool setting.  To prevent odor, you may put drops of ostomy deodorizer in the bag.  If recommended by your health care provider, put ostomy lubricant inside the bag. This helps stool to slide out of the bag more easily and completely. Contact a health care provider if:  You have new or worsening redness, swelling, or pain around your stoma.  You have new or increased fluid or blood coming from your stoma.  Your stoma feels warm to the touch.  You have pus coming from your stoma.  Your stoma extends in or out farther than normal.  You need to change your bag every day.  You have a fever. Get help right  away if:  Your stool is bloody.  You have nausea or you vomit.  You have trouble breathing. Summary  Measure your stoma opening regularly and record the size. Watch for changes.  Empty your bag at bedtime and whenever it is one-third to one-half full. Do not let the bag get more than half-full with stool or gas.  Change your bag every 3-4 days or as often as told by your health care provider.  Whenever you leave home, take extra clothing and an extra  skin barrier and bag with you. This information is not intended to replace advice given to you by your health care provider. Make sure you discuss any questions you have with your health care provider. Document Revised: 12/27/2019 Document Reviewed: 12/27/2019 Elsevier Patient Education  2021 [employer].

## 2020-09-17 NOTE — Progress Notes (Signed)
Reviewed dc instructions with patient and wife. Questions answered at this time and understanding verbalized. PIV removed. All belongings including ostomy supplies with patient. [place] home via wheelchair with wife.

## 2020-09-17 NOTE — Progress Notes (Signed)
[facility] Progress Note  7 Days Post-Op  Subjective: CC-  Feeling well today. Tolerating diet. Denies n/v. Abdominal pain well controlled. Colostomy functioning. Hopeful to go home today.  Objective: Vital signs in last 24 hours: Temp:  [98 F (36.7 C)-98.4 F (36.9 C)] 98 F (36.7 C) (05/11 0620) Pulse Rate:  [73-78] 73 (05/11 0620) Resp:  [15-18] 15 (05/11 0620) BP: (145-152)/(72-81) 152/76 (05/11 0620) SpO2:  [94 %-100 %] 98 % (05/11 0620) Last BM Date: 09/16/20  Intake/Output from previous day: 05/10 0701 - 05/11 0700 In: 360 [P.O.:360] Out: -  Intake/Output this shift: No intake/output data recorded.  PE: Gen: Alert, NAD, pleasant Pulm:Normal rate and effort Abd: Soft,mild distension,appropriately tender around midline incision, otherwise nontender, positive bowel sounds, midline wound with staples in place and no erythema or drainage. Stoma edematous, pink and viable. Colostomy bag with small amount of air and liquidstoolin the bag. Psych: A&Ox3  Skin: no rashes noted, warm and dry   Lab Results:  Recent Labs    09/15/20 0223 09/16/20 0104  WBC 14.0* 13.4*  HGB 9.8* 10.4*  HCT 28.7* 29.6*  PLT 431* 445*   BMET Recent Labs    09/15/20 0223 09/16/20 0104  NA 134* 134*  K 3.9 3.5  CL 103 101  CO2 25 25  GLUCOSE 156* 156*  BUN 7* 7*  CREATININE 1.04 1.04  CALCIUM 8.7* 8.9   PT/INR No results for input(s): LABPROT, INR in the last 72 hours. CMP     Component Value Date/Time   NA 134 (L) 09/16/2020 0104   K 3.5 09/16/2020 0104   CL 101 09/16/2020 0104   CO2 25 09/16/2020 0104   GLUCOSE 156 (H) 09/16/2020 0104   BUN 7 (L) 09/16/2020 0104   CREATININE 1.04 09/16/2020 0104   CALCIUM 8.9 09/16/2020 0104   PROT 7.4 09/05/2020 0348   ALBUMIN 3.3 (L) 09/05/2020 0348   AST 14 (L) 09/05/2020 0348   ALT 21 09/05/2020 0348   ALKPHOS 82 09/05/2020 0348   BILITOT 0.8 09/05/2020 0348   GFRNONAA >60 09/16/2020 0104   GFRAA >60  04/01/2017 1921   Lipase     Component Value Date/Time   LIPASE 23 06/14/2015 1020       Studies/Results: No results found.  Anti-infectives: Anti-infectives (From admission, onward)   Start     Dose/Rate Route Frequency Ordered Stop   09/10/20 0600  cefoTEtan (CEFOTAN) 2 g in sodium chloride 0.9 % 100 mL IVPB        2 g 200 mL/hr over 30 Minutes Intravenous On call to O.R. 09/09/20 1512 09/10/20 1105       Assessment/Plan Partiallyobstructing tumor in the transverse colon. S/P open transverse colectomy with end colostomy 09/11/20 Dr. Bobbye Morton - POD#6 - CEA 1.7. CT chest withoutobvious metastatic disease. There is a right upper lobe nodule that is recommended for surveillance. - Surgical path: Adenocarcinoma, well differentiated, 5.0 cm; Metastatic carcinoma involving one of nineteen lymph nodes (1/19); Margins uninvolved by carcinoma >> this was discussed with patient - tolerating diet and colostomy functioning - WOC following for new stoma  FEN - Soft.IVF VTE -SCDs, Heparin subq ID - Cefotetanperiop. Afebrile Follow up - staple removal, Dr. Bobbye Morton, Dr. Benay Spice, ostomy clinic  Plan: [facility] for discharge today from surgical standpoint. Discharge instructions and follow up info on AVS. PT plans to work with patient one more time this morning prior to discharge.   LOS: 12 days    Wellington Hampshire, [employer]  [facility] 09/17/2020, 8:51 AM Please see Amion for pager number during day hours 7:00am-4:30pm

## 2020-09-17 NOTE — Progress Notes (Signed)
Physical Therapy Treatment Patient Details Name: Thomas [name] MRN: [id number] DOB: 1955/08/13 Today's Date: 09/17/2020    History of Present Illness 65 y/o male presented to ED on 4/28 with complaints of chest pain and abdominal pain. CT concering for possible malignancy of large bowel. 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 On 5/4, pt underwent exploratory laparotomy, mobilization of the splenic flexure, transverse colectomy and end colostomy. PMH: epilepsy, Type II DM, HTN, frontal meningioma s/p lobectomy, hx of acute respiratory failure with hypoxia.    PT Comments    Pt received up in recliner, pleasant and cooperative, excited about going home. Able to mobilize on a modified independent level today with improved gait speed, dynamic balance, and ability to react to unplanned dynamic obstacles in the hallway. Fatigued at end of gait distance. Mobility generally WNL with no significant concerns from PT side. Did encourage progressive walking program at home, and verbally reviewed log rolling and side to/from sitting to help protect abdominal incision with bed mobility with patient able to teach back technique well. Left up in recliner with all needs met, awaiting DC later today. Thank you for the opportunity to participate in his care!     Follow Up Recommendations  No PT follow up     Equipment Recommendations  None recommended by PT    Recommendations for Other Services       Precautions / Restrictions Precautions Precautions: Fall;Other (comment) Precaution Comments: colostomy; Pt. reports 2 falls in the past 6 months Restrictions Weight Bearing Restrictions: No    Mobility  Bed Mobility               General bed mobility comments: up in recliner    Transfers Overall transfer level: Modified independent Equipment used: None Transfers: Sit to/from Stand Sit to Stand: Modified independent (Device/Increase  time)         General transfer comment: no physical assist given  Ambulation/Gait Ambulation/Gait assistance: Modified independent (Device/Increase time) Gait Distance (Feet): 240 Feet Assistive device: None Gait Pattern/deviations: Step-through pattern;Wide base of support     General Gait Details: wide BOS but gait speed definitely improving, no physical assist or guarding given and no unsteadinesss/LOB noted, able to make fast turns in the hallway without slowing down   Stairs             Wheelchair Mobility    Modified Rankin (Stroke Patients Only)       Balance Overall balance assessment: Mild deficits observed, not formally tested;History of Falls                                          Cognition Arousal/Alertness: Awake/alert Behavior During Therapy: WFL for tasks assessed/performed Overall Cognitive Status: Within Functional Limits for tasks assessed                                 General Comments: very pleasant and cooperative, gracious; has mild cognitive deficits at baseline and does occasionally need cues for safety (IE moving linens out from under his feet before standing to avoid a potential slip)      Exercises      General Comments        Pertinent Vitals/Pain Pain Assessment: Faces Faces Pain Scale: No hurt Pain Intervention(s): Limited activity within patient's  tolerance;Monitored during session    Home Living                      Prior Function            PT Goals (current goals can now be found in the care plan section) Acute Rehab PT Goals Patient Stated Goal: go home when able PT Goal Formulation: With patient Time For Goal Achievement: 09/25/20 Potential to Achieve Goals: Good Progress towards PT goals: Progressing toward goals    Frequency    Min 3X/week      PT Plan Current plan remains appropriate    Co-evaluation              AM-PAC PT "6 Clicks" Mobility    Outcome Measure  Help needed turning from your back to your side while in a flat bed without using bedrails?: None Help needed moving from lying on your back to sitting on the side of a flat bed without using bedrails?: None Help needed moving to and from a bed to a chair (including a wheelchair)?: None Help needed standing up from a chair using your arms (e.g., wheelchair or bedside chair)?: None Help needed to walk in hospital room?: None Help needed climbing 3-5 steps with a railing? : A Little 6 Click Score: 23    End of Session   Activity Tolerance: Patient tolerated treatment well Patient left: in chair;with call bell/phone within reach Nurse Communication: Mobility status PT Visit Diagnosis: Muscle weakness (generalized) (M62.81);Unsteadiness on feet (R26.81)     Time: 5993-5701 PT Time Calculation (min) (ACUTE ONLY): 10 min  Charges:  $Gait Training: 8-22 mins                     Windell Norfolk, DPT, PN1   Supplemental Physical Therapist [place]    Pager [phone] Acute Rehab Office [phone]

## 2020-09-18 LAB — SURGICAL PATHOLOGY

## 2020-09-22 ENCOUNTER — Encounter: Payer: Self-pay | Admitting: *Deleted

## 2020-09-22 NOTE — Progress Notes (Signed)
Thomas [name] lives in [place] with his wife, Thomas [name] and one of his sons and daughter-in-law. He has 4 children (2 from  Current marriage). He is disabled due to past brain tumor and seizures. Reports he is ambulatory and has a walker. Has Home Health per [facility] first visit from PT. He does not drive. Has recently changed his PCP to Dr. [name] with [facility] (Dr. Audelia Acton). He has had his COVID booster and agrees to bring vaccine card to office to update his chart. He has diabetes, type ll and takes metformin. Checks glucose few times/month and runs in 200's. Reports peripheral neuropathy and on Gabapentin. Is on Keppra for seizure disorder and has not had seizure for quite a while. Has sleep apnea, but does not use BIPAP device. Tells RN that his mother and several maternal aunts had breast cancer and a maternal uncle had prostate cancer. He denies any religious or cultural considerations that would impact his care.  Informed patient of office location and parking/entrance to office. Must wear mask. He denies any s/s of covid or exposures that he is aware of.

## 2020-09-23 ENCOUNTER — Inpatient Hospital Stay: Payer: Medicare HMO | Attending: Oncology | Admitting: Oncology

## 2020-09-23 ENCOUNTER — Other Ambulatory Visit: Payer: Self-pay

## 2020-09-23 VITALS — BP 138/72 | HR 95 | Temp 98.1°F | Resp 18 | Ht 68.0 in | Wt 278.0 lb

## 2020-09-23 DIAGNOSIS — R918 Other nonspecific abnormal finding of lung field: Secondary | ICD-10-CM | POA: Diagnosis not present

## 2020-09-23 DIAGNOSIS — Z803 Family history of malignant neoplasm of breast: Secondary | ICD-10-CM

## 2020-09-23 DIAGNOSIS — C184 Malignant neoplasm of transverse colon: Secondary | ICD-10-CM

## 2020-09-23 DIAGNOSIS — E1140 Type 2 diabetes mellitus with diabetic neuropathy, unspecified: Secondary | ICD-10-CM | POA: Diagnosis not present

## 2020-09-23 DIAGNOSIS — I10 Essential (primary) hypertension: Secondary | ICD-10-CM | POA: Diagnosis not present

## 2020-09-23 DIAGNOSIS — Z933 Colostomy status: Secondary | ICD-10-CM

## 2020-09-23 DIAGNOSIS — Z8042 Family history of malignant neoplasm of prostate: Secondary | ICD-10-CM

## 2020-09-23 DIAGNOSIS — C189 Malignant neoplasm of colon, unspecified: Secondary | ICD-10-CM | POA: Insufficient documentation

## 2020-09-23 DIAGNOSIS — G40909 Epilepsy, unspecified, not intractable, without status epilepticus: Secondary | ICD-10-CM

## 2020-09-23 NOTE — Progress Notes (Signed)
Thomas [name] New Patient Consult   Requesting MD: Benito Mccreedy, [place] [address] [place],   [address]   Thomas [name] 65 y.o.  December 19, 1955    Reason for Consult: Colon cancer   HPI: Thomas [name] reports a 8-monthhistory of mid abdominal pain with associated constipation.  The pain initially improved with MiraLAX.  The pain became more intense over 3 weeks and he presented to the emergency room on 09/04/2020.  A CT of the abdomen pelvis found the liver to be normal.  A possible mass was noted in the mid transverse colon with conspicuous regional lymph nodes.  Stable fat-containing left inguinal hernia. Dr. CHiram Comberwas consulted and he was taken to colonoscopy 09/05/2020.  An ulcerated near completely obstructing mass was found in the transverse colon.  The mass was biopsied and the area was tattooed.  Declined the scope could not pass the mass.  A 6 mm polyp was removed from the rectum.  The pathology revealed invasive adenocarcinoma involving the transverse colon mass.  No loss of mismatch repair protein expression.  The rectal polyp returned as a tubular adenoma without high-grade dysplasia or malignancy.  A CT of the chest on 09/09/2020 revealed a 0.3 cm subpleural nodule in the right upper lobe and a chronic 3 mm calcified nodule left lower lobe.  Dr. LBobbye Mortonwas consulted and he was taken to the operating room on 09/10/2020 for an exploratory laparotomy, mobilization of the splenic flexure, transverse colectomy, and end colostomy.  The mass was identified and the colon was transected greater than 5 cm proximal and distal to the palpable mass.  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  Tumor invades through the muscularis propria into pericolorectal tissue.  No lymphovascular or perineural invasion.  The resection margins are negative.  Metastatic carcinoma was identified in 1 of 19 lymph nodes.  No tumor deposits.  MSI  stable.  He was discharged from the hospital on 09/17/2020.  The colostomy is functioning well.  The stool is both loose and formed.  He empties the colostomy bag 7-8 times per day.  He does not allow the colostomy bag to feel before emptying.  He is scheduled for staple removal next week.  Mr. HWearingis here today with his wife to discuss the diagnosis of colon cancer and adjuvant treatment options.  Past Medical History:  Diagnosis Date  . Acute ischemic left MCA stroke ([facility] 04/09/2014  . Acute respiratory failure with hypoxia ([facility] 04/09/2014  . Aspiration pneumonia ([facility]   . Asthma    as a child  . Brain tumor ([facility]- meningioma -WHO grade 1  09/01/2016   frontal  . Confusion    occasionally  . Diabetes mellitus without complication ([facility]    takes Metformin daily.  . Dizziness   . Dyspnea    pt states d/t weight  . Epilepsy ([facility]    takes Keppra daily  . GERD (gastroesophageal reflux disease)    takes Omeprazole daily  . Headache   . Hyperlipidemia    takes Atorvastatin daily  . Hypertension    takes Lotrel daily  . Peripheral edema    takes Lasix daily  . Peripheral neuropathy    takes Gabapentin daily  . Pneumonia 3 yrs ago   hx of  . Seizures ([facility]   . Sleep apnea   . Urinary frequency     Past Surgical History:  Procedure Laterality Date  . BIOPSY  09/05/2020   Procedure: BIOPSY;  Surgeon: Irene Shipper, MD;  Location: [facility] ENDOSCOPY;  Service: Endoscopy;;  . CARDIAC CATHETERIZATION  7 yrs ago  . COLONOSCOPY WITH PROPOFOL N/A 09/05/2020   Procedure: COLONOSCOPY WITH PROPOFOL;  Surgeon: Irene Shipper, MD;  [facility] ENDOSCOPY;  Service: Endoscopy;  Laterality: N/A;  . CRANIOTOMY N/A 09/01/2016   Procedure: CRANIOTOMY TUMOR  LEFT PTERIONAL;  Surgeon: Ashok Pall, MD;  Location: [facility];  Service: Neurosurgery;  Laterality: N/A;  . cyst removed from chest      as a child  . NO PAST SURGERIES    . PARTIAL COLECTOMY N/A 09/10/2020   Procedure: TRANSVERSE COLECTOMY;   Surgeon: [name], MD;  Location: [facility];  Service: General;  Laterality: N/A;  . POLYPECTOMY  09/05/2020   Procedure: POLYPECTOMY;  Surgeon: Irene Shipper, MD;  Location: [facility] ENDOSCOPY;  Service: Endoscopy;;  . SUBMUCOSAL TATTOO INJECTION  09/05/2020   Procedure: SUBMUCOSAL TATTOO INJECTION;  Surgeon: Irene Shipper, MD;  Location: [facility] ENDOSCOPY;  Service: Endoscopy;;    .  Right cataract surgery  Medications: Reviewed  Allergies:  Allergies  Allergen Reactions  . No Known Allergies     Family history: His mother and maternal aunts had breast cancer.  A maternal uncle had prostate cancer.  Social History:   He lives with his wife in [place].  He is retired Engineer, water.  He does not use cigarettes or alcohol.  No transfusion history.  No risk factor for HIV or hepatitis.  He has received COVID-19 vaccines.  ROS:   Positives include: 11 pound intentional weight loss prior to surgery, 40 pound weight loss following surgery-intentional, mid abdominal pain prior to surgery, constipation prior to surgery, chronic exertional dyspnea and intermittent dry cough, blurring of vision in the right eye for the past year  A complete ROS was otherwise negative.  Physical Exam:  Blood pressure 138/72, pulse 95, temperature 98.1 F (36.7 C), temperature source Oral, resp. rate 18, height 5' 8"  (1.727 m), weight 278 lb (126.1 kg), SpO2 100 %.  Lungs: Clear bilaterally Cardiac: Regular rate and rhythm Abdomen: No hepatosplenomegaly, right abdomen colostomy with liquid brown stool, healing midline incision with staples in place GU: Uncircumcised male, testes without mass Vascular: No leg edema Lymph nodes: No cervical, supraclavicular, axillary, or inguinal nodes Neurologic: Alert and oriented, the motor exam appears intact in the upper and lower extremities bilaterally Skin: Few pustular lesions over the chest and back Musculoskeletal: No spine tenderness   LAB:  CBC  Lab  Results  Component Value Date   WBC 13.4 (H) 09/16/2020   HGB 10.4 (L) 09/16/2020   HCT 29.6 (L) 09/16/2020   MCV 73.6 (L) 09/16/2020   PLT 445 (H) 09/16/2020   NEUTROABS 9.7 (H) 09/15/2020        CMP  Lab Results  Component Value Date   NA 134 (L) 09/16/2020   K 3.5 09/16/2020   CL 101 09/16/2020   CO2 25 09/16/2020   GLUCOSE 156 (H) 09/16/2020   BUN 7 (L) 09/16/2020   CREATININE 1.04 09/16/2020   CALCIUM 8.9 09/16/2020   PROT 7.4 09/05/2020   ALBUMIN 3.3 (L) 09/05/2020   AST 14 (L) 09/05/2020   ALT 21 09/05/2020   ALKPHOS 82 09/05/2020   BILITOT 0.8 09/05/2020   GFRNONAA >60 09/16/2020   GFRAA >60 04/01/2017     Lab Results  Component Value Date   CEA1 1.7 09/05/2020    Imaging: CT  abdomen/pelvis 08/27/2020 and CT chest 09/09/2020-images reviewed     Assessment/Plan:   1. Transverse colon cancer, stage IIIb (T3N1a), status post a partial transverse colectomy and end colostomy 09/10/2020  CT abdomen/pelvis 09/04/2020- possible transverse colon mass with small regional lymph nodes  Colonoscopy 09/05/2020- obstructing transverse colon mass-biopsy invasive adenocarcinoma, mass could not be passed  CT chest 09/09/2020-no evidence of metastatic disease, 3 x 2 mm subpleural nodule in the right upper lobe likely benign subpleural lymph node  Partial transverse colectomy 09/10/2020, transverse colon tumor, no lymphovascular perineural invasion, 1/19 lymph nodes positive, negative margins, MSI stable, no loss of mismatch repair protein expression 2. Resection of a frontal meningioma 09/01/2016 3. Diabetes 4. Rectal polyp- tubular adenoma on colonoscopy 09/05/2020 5. Hypertension 6. Diabetic neuropathy 7. Epilepsy 8. Family history of breast and prostate cancer    Disposition:   Mr. Commons has been diagnosed with adenocarcinoma of the transverse colon.  I reviewed details of the surgery pathology report with him.  We discussed adjuvant treatment options.  He has been  diagnosed with early stage III colon cancer.  I reviewed the prognosis and expected benefit from adjuvant chemotherapy.  We discussed the relative benefits associated with 5-fluorouracil and oxaliplatin.  We reviewed potential toxicities associated with 5-fluorouracil, oxaliplatin, and capecitabine.  We discussed administration of the CAPOX and FOLFOX regimens.  We discussed treatment with single agent capecitabine.  He appears to be a candidate for 3 months of adjuvant systemic therapy since he has early stage III disease.  He has mild neuropathy related to diabetes and is at risk for developing increased neuropathy with a prolonged course of oxaliplatin.  Mr. Wronski would like to proceed with oxaliplatin chemotherapy.  He understands the risk of worsening and permanent neuropathy.  He prefers CAPOX.  We reviewed potential toxicities associated with the CAPOX regimen including the chance of nausea/vomiting, mucositis, alopecia, diarrhea, and hematologic toxicity.  We discussed the sun sensitivity, rash, hyperpigmentation, and hand/foot syndrome associated with capecitabine.  We reviewed the allergic reaction and various types of neuropathy seen with oxaliplatin.  He agrees to proceed.  He will attend a chemotherapy teaching class.  Mr. Bomkamp will be referred for Port-A-Cath placement.  The plan is to begin CAPOX on 10/08/2020.  He does not appear to have hereditary nonpropulsive colon cancer syndrome, but his family members are at increased risk of developing colorectal cancer and should receive appropriate screening.  A chemotherapy plan was entered today.  Betsy Coder, MD  09/23/2020, 4:05 PM

## 2020-09-23 NOTE — Progress Notes (Signed)
START ON PATHWAY REGIMEN - Colorectal ° ° °  A cycle is every 21 days: °    Capecitabine  °    Oxaliplatin  ° °**Always confirm dose/schedule in your pharmacy ordering system** ° °Patient Characteristics: °Postoperative without Neoadjuvant Therapy (Pathologic Staging), Colon, Stage III, Low Risk (pT1-3, pN1) °Tumor Location: Colon °Therapeutic Status: Postoperative without Neoadjuvant Therapy (Pathologic Staging) °AJCC M Category: cM0 °AJCC T Category: pT3 °AJCC N Category: pN1a °AJCC 8 Stage Grouping: IIIB °Intent of Therapy: °Curative Intent, Discussed with Patient °

## 2020-09-24 ENCOUNTER — Other Ambulatory Visit: Payer: Self-pay

## 2020-09-24 ENCOUNTER — Inpatient Hospital Stay: Payer: Medicare HMO

## 2020-09-24 DIAGNOSIS — C184 Malignant neoplasm of transverse colon: Secondary | ICD-10-CM

## 2020-09-24 MED ORDER — PROCHLORPERAZINE MALEATE 10 MG PO TABS: 10.0000 mg | ORAL_TABLET | Freq: Four times a day (QID) | ORAL | 1 refills | Status: DC | PRN

## 2020-09-24 MED ORDER — LIDOCAINE-PRILOCAINE 2.5-2.5 % EX CREA: 1.0000 "application " | TOPICAL_CREAM | CUTANEOUS | 2 refills | Status: DC

## 2020-09-24 MED ORDER — ONDANSETRON HCL 8 MG PO TABS: 8.0000 mg | ORAL_TABLET | Freq: Three times a day (TID) | ORAL | 1 refills | Status: DC | PRN

## 2020-09-24 NOTE — Progress Notes (Signed)
Called [facility] and spoke with Mechele Claude, who will inform Dr. Bobbye Morton that port is needed prior to his 1st chemo treatment on 10/08/20.

## 2020-09-24 NOTE — Progress Notes (Signed)
The proposed treatment discussed in conference is for discussion purposes only and is not a binding recommendation.  The patients have not been physically examined, or presented with their treatment options.  Therefore, final treatment plans cannot be decided.   

## 2020-09-25 ENCOUNTER — Telehealth: Payer: Self-pay | Admitting: Dietician

## 2020-09-25 ENCOUNTER — Ambulatory Visit: Payer: Self-pay | Admitting: Surgery

## 2020-09-25 NOTE — Telephone Encounter (Signed)
Scheduled appt per 5/18 sch msg. Pt aware.  

## 2020-09-26 ENCOUNTER — Encounter (HOSPITAL_COMMUNITY): Payer: Self-pay | Admitting: Surgery

## 2020-09-26 ENCOUNTER — Other Ambulatory Visit: Payer: Self-pay

## 2020-09-26 ENCOUNTER — Inpatient Hospital Stay: Payer: Medicare HMO | Admitting: Dietician

## 2020-09-26 NOTE — Progress Notes (Signed)
Nutrition  Patient did not show for scheduled for nutrition appointment this afternoon.   Lajuan Lines, RD, [place] Cell [phone]

## 2020-09-29 ENCOUNTER — Encounter (HOSPITAL_COMMUNITY): Payer: Self-pay | Admitting: Surgery

## 2020-09-29 ENCOUNTER — Other Ambulatory Visit (HOSPITAL_COMMUNITY)
Admission: RE | Admit: 2020-09-29 | Discharge: 2020-09-29 | Disposition: A | Discharge status: Home / Self Care | Payer: Medicare HMO | Source: Ambulatory Visit | Attending: Surgery | Admitting: Surgery

## 2020-09-29 DIAGNOSIS — Z01812 Encounter for preprocedural laboratory examination: Secondary | ICD-10-CM | POA: Insufficient documentation

## 2020-09-29 DIAGNOSIS — Z20822 Contact with and (suspected) exposure to covid-19: Secondary | ICD-10-CM | POA: Insufficient documentation

## 2020-09-29 LAB — SARS CORONAVIRUS 2 (TAT 6-24 HRS): SARS Coronavirus 2: NEGATIVE

## 2020-09-29 NOTE — Progress Notes (Signed)
EKG: 09/04/20 CXR: 09/04/20 ECHO: denies Stress Test: denies Cardiac Cath: denies  Fasting Blood Sugar- mid afternoon runs between 170-200 Checks Blood Sugar___ times a day. Only has BG checked when home health nurse comes in and checks.   OSA: Yes CPAP: No  ASA/Blood Thinners: No  Covid test not needed  Anesthesia Review: No  Patient denies shortness of breath, fever, cough, and chest pain at PAT appointment.  Patient verbalized understanding of instructions provided today at the PAT appointment.  Patient asked to review instructions at home and day of surgery.

## 2020-10-01 ENCOUNTER — Ambulatory Visit (HOSPITAL_COMMUNITY): Payer: Medicare HMO | Admitting: Anesthesiology

## 2020-10-01 ENCOUNTER — Ambulatory Visit (HOSPITAL_COMMUNITY): Payer: Medicare HMO

## 2020-10-01 ENCOUNTER — Encounter (HOSPITAL_COMMUNITY)
Admission: RE | Disposition: A | Discharge status: Home / Self Care | Payer: Self-pay | Source: Home / Self Care | Attending: Family Medicine

## 2020-10-01 ENCOUNTER — Inpatient Hospital Stay (HOSPITAL_COMMUNITY)
Admission: RE | Admit: 2020-10-01 | Discharge: 2020-10-03 | DRG: 674 | Disposition: A | Discharge status: Home Health | Payer: Medicare HMO | Attending: Family Medicine | Admitting: Family Medicine

## 2020-10-01 DIAGNOSIS — N182 Chronic kidney disease, stage 2 (mild): Secondary | ICD-10-CM | POA: Diagnosis present

## 2020-10-01 DIAGNOSIS — Z803 Family history of malignant neoplasm of breast: Secondary | ICD-10-CM

## 2020-10-01 DIAGNOSIS — N179 Acute kidney failure, unspecified: Principal | ICD-10-CM | POA: Diagnosis present

## 2020-10-01 DIAGNOSIS — Z8673 Personal history of transient ischemic attack (TIA), and cerebral infarction without residual deficits: Secondary | ICD-10-CM | POA: Diagnosis not present

## 2020-10-01 DIAGNOSIS — E785 Hyperlipidemia, unspecified: Secondary | ICD-10-CM | POA: Diagnosis present

## 2020-10-01 DIAGNOSIS — Z933 Colostomy status: Secondary | ICD-10-CM | POA: Diagnosis not present

## 2020-10-01 DIAGNOSIS — E118 Type 2 diabetes mellitus with unspecified complications: Secondary | ICD-10-CM | POA: Diagnosis not present

## 2020-10-01 DIAGNOSIS — Z86011 Personal history of benign neoplasm of the brain: Secondary | ICD-10-CM

## 2020-10-01 DIAGNOSIS — E872 Acidosis, unspecified: Secondary | ICD-10-CM | POA: Diagnosis present

## 2020-10-01 DIAGNOSIS — Z7984 Long term (current) use of oral hypoglycemic drugs: Secondary | ICD-10-CM

## 2020-10-01 DIAGNOSIS — Z9049 Acquired absence of other specified parts of digestive tract: Secondary | ICD-10-CM | POA: Diagnosis not present

## 2020-10-01 DIAGNOSIS — Z87891 Personal history of nicotine dependence: Secondary | ICD-10-CM | POA: Diagnosis not present

## 2020-10-01 DIAGNOSIS — D75839 Thrombocytosis, unspecified: Secondary | ICD-10-CM | POA: Diagnosis not present

## 2020-10-01 DIAGNOSIS — G40909 Epilepsy, unspecified, not intractable, without status epilepticus: Secondary | ICD-10-CM | POA: Diagnosis present

## 2020-10-01 DIAGNOSIS — C184 Malignant neoplasm of transverse colon: Secondary | ICD-10-CM

## 2020-10-01 DIAGNOSIS — E871 Hypo-osmolality and hyponatremia: Secondary | ICD-10-CM | POA: Diagnosis present

## 2020-10-01 DIAGNOSIS — E860 Dehydration: Secondary | ICD-10-CM | POA: Diagnosis present

## 2020-10-01 DIAGNOSIS — I10 Essential (primary) hypertension: Secondary | ICD-10-CM | POA: Diagnosis present

## 2020-10-01 DIAGNOSIS — K219 Gastro-esophageal reflux disease without esophagitis: Secondary | ICD-10-CM | POA: Diagnosis present

## 2020-10-01 DIAGNOSIS — Z95828 Presence of other vascular implants and grafts: Secondary | ICD-10-CM

## 2020-10-01 DIAGNOSIS — C189 Malignant neoplasm of colon, unspecified: Secondary | ICD-10-CM | POA: Diagnosis present

## 2020-10-01 DIAGNOSIS — E1142 Type 2 diabetes mellitus with diabetic polyneuropathy: Secondary | ICD-10-CM | POA: Diagnosis present

## 2020-10-01 DIAGNOSIS — E1121 Type 2 diabetes mellitus with diabetic nephropathy: Secondary | ICD-10-CM

## 2020-10-01 DIAGNOSIS — D72829 Elevated white blood cell count, unspecified: Secondary | ICD-10-CM | POA: Diagnosis not present

## 2020-10-01 DIAGNOSIS — Z20822 Contact with and (suspected) exposure to covid-19: Secondary | ICD-10-CM | POA: Diagnosis present

## 2020-10-01 DIAGNOSIS — E1100 Type 2 diabetes mellitus with hyperosmolarity without nonketotic hyperglycemic-hyperosmolar coma (NKHHC): Secondary | ICD-10-CM

## 2020-10-01 DIAGNOSIS — Z79899 Other long term (current) drug therapy: Secondary | ICD-10-CM | POA: Diagnosis not present

## 2020-10-01 DIAGNOSIS — Z419 Encounter for procedure for purposes other than remedying health state, unspecified: Secondary | ICD-10-CM

## 2020-10-01 DIAGNOSIS — E119 Type 2 diabetes mellitus without complications: Secondary | ICD-10-CM

## 2020-10-01 DIAGNOSIS — D500 Iron deficiency anemia secondary to blood loss (chronic): Secondary | ICD-10-CM

## 2020-10-01 DIAGNOSIS — Z8042 Family history of malignant neoplasm of prostate: Secondary | ICD-10-CM

## 2020-10-01 HISTORY — PX: PORTACATH PLACEMENT: SHX2246

## 2020-10-01 HISTORY — DX: Malignant (primary) neoplasm, unspecified: C80.1

## 2020-10-01 LAB — CBC
HCT: 29.5 % — ABNORMAL LOW (ref 39.0–52.0)
Hemoglobin: 10.2 g/dL — ABNORMAL LOW (ref 13.0–17.0)
MCH: 25.1 pg — ABNORMAL LOW (ref 26.0–34.0)
MCHC: 34.6 g/dL (ref 30.0–36.0)
MCV: 72.7 fL — ABNORMAL LOW (ref 80.0–100.0)
Platelets: 529 10*3/uL — ABNORMAL HIGH (ref 150–400)
RBC: 4.06 MIL/uL — ABNORMAL LOW (ref 4.22–5.81)
RDW: 14.8 % (ref 11.5–15.5)
WBC: 14.7 10*3/uL — ABNORMAL HIGH (ref 4.0–10.5)
nRBC: 0 % (ref 0.0–0.2)

## 2020-10-01 LAB — PROTEIN / CREATININE RATIO, URINE
Creatinine, Urine: 136.54 mg/dL
Protein Creatinine Ratio: 0.24 mg/mg{Cre} — ABNORMAL HIGH (ref 0.00–0.15)
Total Protein, Urine: 33 mg/dL

## 2020-10-01 LAB — BASIC METABOLIC PANEL
Anion gap: 10 (ref 5–15)
BUN: 88 mg/dL — ABNORMAL HIGH (ref 8–23)
CO2: 15 mmol/L — ABNORMAL LOW (ref 22–32)
Calcium: 9.3 mg/dL (ref 8.9–10.3)
Chloride: 104 mmol/L (ref 98–111)
Creatinine, Ser: 4.22 mg/dL — ABNORMAL HIGH (ref 0.61–1.24)
GFR, Estimated: 15 mL/min — ABNORMAL LOW (ref >60)
Glucose, Bld: 148 mg/dL — ABNORMAL HIGH (ref 70–99)
Potassium: 5.1 mmol/L (ref 3.5–5.1)
Sodium: 129 mmol/L — ABNORMAL LOW (ref 135–145)

## 2020-10-01 LAB — URINALYSIS, ROUTINE W REFLEX MICROSCOPIC
Bilirubin Urine: NEGATIVE
Glucose, UA: NEGATIVE mg/dL
Ketones, ur: NEGATIVE mg/dL
Leukocytes,Ua: NEGATIVE
Nitrite: NEGATIVE
Protein, ur: NEGATIVE mg/dL
Specific Gravity, Urine: 1.012 (ref 1.005–1.030)
pH: 5 (ref 5.0–8.0)

## 2020-10-01 LAB — SODIUM, URINE, RANDOM: Sodium, Ur: 11 mmol/L

## 2020-10-01 LAB — GLUCOSE, CAPILLARY
Glucose-Capillary: 155 mg/dL — ABNORMAL HIGH (ref 70–99)
Glucose-Capillary: 111 mg/dL — ABNORMAL HIGH (ref 70–99)
Glucose-Capillary: 150 mg/dL — ABNORMAL HIGH (ref 70–99)

## 2020-10-01 LAB — RESP PANEL BY RT-PCR (FLU A&B, COVID) ARPGX2
Influenza A by PCR: NEGATIVE
Influenza B by PCR: NEGATIVE
SARS Coronavirus 2 by RT PCR: NEGATIVE

## 2020-10-01 LAB — CREATININE, URINE, RANDOM: Creatinine, Urine: 133.55 mg/dL

## 2020-10-01 SURGERY — INSERTION, TUNNELED CENTRAL VENOUS DEVICE, WITH PORT
Anesthesia: Monitor Anesthesia Care | Site: Chest | Laterality: Right

## 2020-10-01 MED ORDER — SODIUM CHLORIDE 0.9 % IV SOLN
INTRAVENOUS | Status: DC | PRN
  Administered 2020-10-01: 500 mL

## 2020-10-01 MED ORDER — BUPIVACAINE LIPOSOME 1.3 % IJ SUSP
20.0000 mL | Freq: Once | INTRAMUSCULAR | Status: DC
  Filled 2020-10-01: qty 20

## 2020-10-01 MED ORDER — SODIUM CHLORIDE 0.9 % IV SOLN
INTRAVENOUS | Status: AC
  Filled 2020-10-01: qty 1.2

## 2020-10-01 MED ORDER — ONDANSETRON HCL 4 MG/2ML IJ SOLN
INTRAMUSCULAR | Status: AC
  Filled 2020-10-01: qty 2

## 2020-10-01 MED ORDER — INSULIN ASPART 100 UNIT/ML IJ SOLN: 0.0000 [IU] | Freq: Every day | INTRAMUSCULAR | Status: DC

## 2020-10-01 MED ORDER — SODIUM CHLORIDE 0.9 % IV BOLUS
500.0000 mL | Freq: Once | INTRAVENOUS | Status: AC
  Administered 2020-10-01: 500 mL via INTRAVENOUS

## 2020-10-01 MED ORDER — DEXMEDETOMIDINE (PRECEDEX) IN NS 20 MCG/5ML (4 MCG/ML) IV SYRINGE
PREFILLED_SYRINGE | INTRAVENOUS | Status: DC | PRN
  Administered 2020-10-01: 8 ug via INTRAVENOUS

## 2020-10-01 MED ORDER — PROPOFOL 1000 MG/100ML IV EMUL
INTRAVENOUS | Status: AC
  Filled 2020-10-01: qty 100

## 2020-10-01 MED ORDER — ATORVASTATIN CALCIUM 10 MG PO TABS
20.0000 mg | ORAL_TABLET | Freq: Every day | ORAL | Status: DC
  Administered 2020-10-01 – 2020-10-02 (×2): 20 mg via ORAL
  Filled 2020-10-01 (×2): qty 2

## 2020-10-01 MED ORDER — CEFAZOLIN IN SODIUM CHLORIDE 3-0.9 GM/100ML-% IV SOLN
3.0000 g | INTRAVENOUS | Status: AC
  Administered 2020-10-01: 3 g via INTRAVENOUS
  Filled 2020-10-01: qty 100

## 2020-10-01 MED ORDER — LEVETIRACETAM 500 MG PO TABS
500.0000 mg | ORAL_TABLET | Freq: Two times a day (BID) | ORAL | Status: DC
  Administered 2020-10-01 – 2020-10-03 (×4): 500 mg via ORAL
  Filled 2020-10-01 (×4): qty 1

## 2020-10-01 MED ORDER — HYDRALAZINE HCL 25 MG PO TABS: 25.0000 mg | ORAL_TABLET | Freq: Three times a day (TID) | ORAL | Status: DC | PRN

## 2020-10-01 MED ORDER — PROPOFOL 10 MG/ML IV BOLUS
INTRAVENOUS | Status: DC | PRN
  Administered 2020-10-01 (×3): 20 mg via INTRAVENOUS

## 2020-10-01 MED ORDER — ONDANSETRON HCL 4 MG/2ML IJ SOLN: 4.0000 mg | Freq: Four times a day (QID) | INTRAMUSCULAR | Status: DC | PRN

## 2020-10-01 MED ORDER — DEXMEDETOMIDINE (PRECEDEX) IN NS 20 MCG/5ML (4 MCG/ML) IV SYRINGE
PREFILLED_SYRINGE | INTRAVENOUS | Status: AC
  Filled 2020-10-01: qty 5

## 2020-10-01 MED ORDER — BUPIVACAINE-EPINEPHRINE (PF) 0.25% -1:200000 IJ SOLN
INTRAMUSCULAR | Status: AC
  Filled 2020-10-01: qty 30

## 2020-10-01 MED ORDER — FENTANYL CITRATE (PF) 100 MCG/2ML IJ SOLN: 25.0000 ug | INTRAMUSCULAR | Status: DC | PRN

## 2020-10-01 MED ORDER — OXYCODONE HCL 5 MG PO TABS
5.0000 mg | ORAL_TABLET | Freq: Once | ORAL | Status: DC | PRN
Start: 2020-10-01 — End: 2020-10-01

## 2020-10-01 MED ORDER — CHLORHEXIDINE GLUCONATE 0.12 % MT SOLN
15.0000 mL | Freq: Once | OROMUCOSAL | Status: AC
  Administered 2020-10-01: 15 mL via OROMUCOSAL
  Filled 2020-10-01: qty 15

## 2020-10-01 MED ORDER — LEVETIRACETAM 500 MG PO TABS: 1000.0000 mg | ORAL_TABLET | Freq: Two times a day (BID) | ORAL | Status: DC

## 2020-10-01 MED ORDER — 0.9 % SODIUM CHLORIDE (POUR BTL) OPTIME
TOPICAL | Status: DC | PRN
  Administered 2020-10-01: 1000 mL

## 2020-10-01 MED ORDER — BUPIVACAINE-EPINEPHRINE 0.25% -1:200000 IJ SOLN
INTRAMUSCULAR | Status: DC | PRN
  Administered 2020-10-01: 20 mL

## 2020-10-01 MED ORDER — ONDANSETRON HCL 4 MG/2ML IJ SOLN
INTRAMUSCULAR | Status: DC | PRN
  Administered 2020-10-01: 4 mg via INTRAVENOUS

## 2020-10-01 MED ORDER — OXYCODONE HCL 5 MG/5ML PO SOLN: 5.0000 mg | Freq: Once | ORAL | Status: DC | PRN

## 2020-10-01 MED ORDER — ONDANSETRON HCL 4 MG PO TABS: 4.0000 mg | ORAL_TABLET | Freq: Four times a day (QID) | ORAL | Status: DC | PRN

## 2020-10-01 MED ORDER — FENTANYL CITRATE (PF) 250 MCG/5ML IJ SOLN
INTRAMUSCULAR | Status: AC
  Filled 2020-10-01: qty 5

## 2020-10-01 MED ORDER — FENTANYL CITRATE (PF) 100 MCG/2ML IJ SOLN
INTRAMUSCULAR | Status: DC | PRN
  Administered 2020-10-01: 100 ug via INTRAVENOUS

## 2020-10-01 MED ORDER — MIDAZOLAM HCL 2 MG/2ML IJ SOLN
INTRAMUSCULAR | Status: AC
  Filled 2020-10-01: qty 2

## 2020-10-01 MED ORDER — ACETAMINOPHEN 500 MG PO TABS
1000.0000 mg | ORAL_TABLET | Freq: Three times a day (TID) | ORAL | Status: DC | PRN
  Administered 2020-10-01 – 2020-10-03 (×4): 1000 mg via ORAL
  Filled 2020-10-01 (×4): qty 2

## 2020-10-01 MED ORDER — PHENYLEPHRINE HCL-NACL 10-0.9 MG/250ML-% IV SOLN
INTRAVENOUS | Status: DC | PRN
  Administered 2020-10-01: 25 ug/min via INTRAVENOUS

## 2020-10-01 MED ORDER — ENOXAPARIN SODIUM 40 MG/0.4ML IJ SOSY
40.0000 mg | PREFILLED_SYRINGE | Freq: Once | INTRAMUSCULAR | Status: AC
  Administered 2020-10-01: 40 mg via SUBCUTANEOUS
  Filled 2020-10-01: qty 0.4

## 2020-10-01 MED ORDER — CHLORHEXIDINE GLUCONATE CLOTH 2 % EX PADS: 6.0000 | MEDICATED_PAD | Freq: Once | CUTANEOUS | Status: DC

## 2020-10-01 MED ORDER — PROPOFOL 10 MG/ML IV BOLUS
INTRAVENOUS | Status: AC
  Filled 2020-10-01: qty 20

## 2020-10-01 MED ORDER — GABAPENTIN 100 MG PO CAPS
100.0000 mg | ORAL_CAPSULE | Freq: Three times a day (TID) | ORAL | Status: DC
  Administered 2020-10-01 – 2020-10-03 (×6): 100 mg via ORAL
  Filled 2020-10-01 (×6): qty 1

## 2020-10-01 MED ORDER — MIDAZOLAM HCL 5 MG/5ML IJ SOLN
INTRAMUSCULAR | Status: DC | PRN
  Administered 2020-10-01: 2 mg via INTRAVENOUS

## 2020-10-01 MED ORDER — INSULIN ASPART 100 UNIT/ML IJ SOLN
0.0000 [IU] | Freq: Three times a day (TID) | INTRAMUSCULAR | Status: DC
  Administered 2020-10-02: 1 [IU] via SUBCUTANEOUS
  Administered 2020-10-02: 2 [IU] via SUBCUTANEOUS
  Administered 2020-10-02: 1 [IU] via SUBCUTANEOUS
  Administered 2020-10-03: 3 [IU] via SUBCUTANEOUS
  Administered 2020-10-03: 1 [IU] via SUBCUTANEOUS

## 2020-10-01 MED ORDER — LACTATED RINGERS IV SOLN: INTRAVENOUS | Status: DC

## 2020-10-01 MED ORDER — HEPARIN SOD (PORK) LOCK FLUSH 100 UNIT/ML IV SOLN
INTRAVENOUS | Status: DC | PRN
  Administered 2020-10-01: 200 [IU]

## 2020-10-01 MED ORDER — PROPOFOL 500 MG/50ML IV EMUL
INTRAVENOUS | Status: DC | PRN
  Administered 2020-10-01: 25 ug/kg/min via INTRAVENOUS

## 2020-10-01 MED ORDER — SODIUM CHLORIDE 0.9 % IV SOLN: INTRAVENOUS | Status: DC

## 2020-10-01 MED ORDER — ENOXAPARIN SODIUM 30 MG/0.3ML IJ SOSY
30.0000 mg | PREFILLED_SYRINGE | INTRAMUSCULAR | Status: DC
  Administered 2020-10-02 – 2020-10-03 (×2): 30 mg via SUBCUTANEOUS
  Filled 2020-10-01 (×2): qty 0.3

## 2020-10-01 MED ORDER — HEPARIN SOD (PORK) LOCK FLUSH 100 UNIT/ML IV SOLN
INTRAVENOUS | Status: AC
  Filled 2020-10-01: qty 5

## 2020-10-01 MED ORDER — ORAL CARE MOUTH RINSE: 15.0000 mL | Freq: Once | OROMUCOSAL | Status: AC

## 2020-10-01 SURGICAL SUPPLY — 39 items
ADH SKN CLS APL DERMABOND .7 (GAUZE/BANDAGES/DRESSINGS) ×1
BAG DECANTER FOR FLEXI CONT (MISCELLANEOUS) ×2 IMPLANT
CHLORAPREP W/TINT 10.5 ML (MISCELLANEOUS) ×2 IMPLANT
COVER SURGICAL LIGHT HANDLE (MISCELLANEOUS) ×3 IMPLANT
COVER TRANSDUCER ULTRASND GEL (DISPOSABLE) IMPLANT
DERMABOND ADVANCED (GAUZE/BANDAGES/DRESSINGS) ×1
DERMABOND ADVANCED .7 DNX12 (GAUZE/BANDAGES/DRESSINGS) IMPLANT
DRAPE C-ARM 42X120 X-RAY (DRAPES) ×2 IMPLANT
ELECT CAUTERY BLADE 6.4 (BLADE) ×2 IMPLANT
ELECT REM PT RETURN 9FT ADLT (ELECTROSURGICAL) ×2
ELECTRODE REM PT RTRN 9FT ADLT (ELECTROSURGICAL) ×1 IMPLANT
GEL ULTRASOUND 20GR AQUASONIC (MISCELLANEOUS) IMPLANT
GLOVE BIO SURGEON STRL SZ 6.5 (GLOVE) ×2 IMPLANT
GLOVE BIOGEL PI IND STRL 6 (GLOVE) ×1 IMPLANT
GLOVE BIOGEL PI INDICATOR 6 (GLOVE) ×1
GOWN STRL REUS W/ TWL LRG LVL3 (GOWN DISPOSABLE) ×2 IMPLANT
GOWN STRL REUS W/TWL LRG LVL3 (GOWN DISPOSABLE) ×4
INTRODUCER COOK 11FR (CATHETERS) IMPLANT
KIT BASIN OR (CUSTOM PROCEDURE TRAY) ×2 IMPLANT
KIT PORT POWER 8FR ISP CVUE (Port) ×1 IMPLANT
KIT TURNOVER KIT B (KITS) ×2 IMPLANT
NS IRRIG 1000ML POUR BTL (IV SOLUTION) ×2 IMPLANT
PAD ARMBOARD 7.5X6 YLW CONV (MISCELLANEOUS) ×2 IMPLANT
PENCIL BUTTON HOLSTER BLD 10FT (ELECTRODE) ×2 IMPLANT
POSITIONER HEAD DONUT 9IN (MISCELLANEOUS) ×2 IMPLANT
SET INTRODUCER 12FR PACEMAKER (INTRODUCER) IMPLANT
SET SHEATH INTRODUCER 10FR (MISCELLANEOUS) IMPLANT
SHEATH COOK PEEL AWAY SET 9F (SHEATH) IMPLANT
SUT MNCRL AB 4-0 PS2 18 (SUTURE) ×2 IMPLANT
SUT PROLENE 2 0 SH DA (SUTURE) ×2 IMPLANT
SUT VIC AB 2-0 SH 27 (SUTURE) ×4
SUT VIC AB 2-0 SH 27XBRD (SUTURE) IMPLANT
SUT VIC AB 3-0 SH 27 (SUTURE) ×2
SUT VIC AB 3-0 SH 27X BRD (SUTURE) ×1 IMPLANT
SYR 5ML LUER SLIP (SYRINGE) ×2 IMPLANT
SYR CONTROL 10ML LL (SYRINGE) ×1 IMPLANT
TOWEL GREEN STERILE (TOWEL DISPOSABLE) ×2 IMPLANT
TOWEL GREEN STERILE FF (TOWEL DISPOSABLE) ×2 IMPLANT
TRAY LAPAROSCOPIC MC (CUSTOM PROCEDURE TRAY) ×2 IMPLANT

## 2020-10-01 NOTE — H&P (Addendum)
History and Physical  Patient Name: [name]     TKZ:[id number]    DOB: 1955/10/12    DOA: 10/01/2020 PCP: [name], NP  Patient coming from: Home  Chief Complaint: Abnormal labs      HPI: [name] is a 65 y.o. M with hx DM, HTN, epilepsy, hx CVA, colon CA s/p recent colectomy and ostomy who presents with acute renal failure.  Patient was admitted last month for bowel obstruction, known to have colon mass, underwent partial transverse colectomy with colostomy on 5/4 by Dr. Bobbye Morton.  Since then, has been doing well.  Saw Dr. Learta Codding and was planning on FOLFOX to start next week, was here for port placement.  While in PACU after his operation, Dr. Bobbye Morton was contacted by his PCP who reported lab work drawn yesterday showed acute renal failure.  Patient reports to me that over the last week or more, he has been reducing his oral and fluid intake because he wanted to reduce his ostomy output.  He had been feeling somewhat tired and fatigued, but no malaise, no anuria, no difficulty urinating, no abdominal pain, no confusion.  He has been taking his normal medicines, including lisinopril.  He has not been taking NSAIDs.       ROS: Review of Systems  Constitutional: Positive for malaise/fatigue. Negative for chills and fever.  Respiratory: Negative for cough and shortness of breath.   Cardiovascular: Negative for chest pain and orthopnea.  Gastrointestinal: Negative for abdominal pain, blood in stool, constipation, diarrhea, melena and vomiting.  Genitourinary: Negative for dysuria, flank pain, frequency, hematuria and urgency.  All other systems reviewed and are negative.         Past Medical History:  Diagnosis Date  . Acute ischemic left MCA stroke ([place]) 04/09/2014  . Acute respiratory failure with hypoxia ([place]) 04/09/2014  . Aspiration pneumonia ([place])   . Asthma    as a child  . 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 Cancer ([place])   . Confusion    occasionally  .  Diabetes mellitus without complication ([place])    takes Metformin daily.  . Dizziness   . Dyspnea    pt states d/t weight  . Epilepsy ([place])    takes Keppra daily  . GERD (gastroesophageal reflux disease)    takes Omeprazole daily  . Headache   . Hyperlipidemia    takes Atorvastatin daily  . Hypertension    takes Lotrel daily  . Peripheral edema    takes Lasix daily  . Peripheral neuropathy    takes Gabapentin daily  . Pneumonia 3 yrs ago   hx of  . Seizures ([place])   . Sleep apnea   . Urinary frequency     Past Surgical History:  Procedure Laterality Date  . BIOPSY  09/05/2020   Procedure: BIOPSY;  Surgeon: Irene Shipper, MD;  Location: [facility] ENDOSCOPY;  Service: Endoscopy;;  . CARDIAC CATHETERIZATION  7 yrs ago  . COLONOSCOPY WITH PROPOFOL N/A 09/05/2020   Procedure: COLONOSCOPY WITH PROPOFOL;  Surgeon: Irene Shipper, MD;  Location: [facility] ENDOSCOPY;  Service: Endoscopy;  Laterality: N/A;  . CRANIOTOMY N/A 09/01/2016   Procedure: CRANIOTOMY TUMOR  LEFT PTERIONAL;  Surgeon: Ashok Pall, MD;  Location: [place];  Service: Neurosurgery;  Laterality: N/A;  . cyst removed from chest      as a child  . NO PAST SURGERIES    . PARTIAL COLECTOMY N/A 09/10/2020   Procedure: TRANSVERSE COLECTOMY;  Surgeon: [name], MD;  Location: [place];  Service: General;  Laterality: N/A;  . POLYPECTOMY  09/05/2020   Procedure: POLYPECTOMY;  Surgeon: Irene Shipper, MD;  Location: [facility] ENDOSCOPY;  Service: Endoscopy;;  . SUBMUCOSAL TATTOO INJECTION  09/05/2020   Procedure: SUBMUCOSAL TATTOO INJECTION;  Surgeon: Irene Shipper, MD;  Location: [facility] ENDOSCOPY;  Service: Endoscopy;;    Social History: Patient lives with his wife and son and daughter-in-law.  The patient walks unassisted.  nonsmoker.  Retired Printmaker, retired Theme park manager at [employer].  No Known Allergies  Family history: family history includes Breast cancer in his maternal aunt and mother; Prostate cancer in his maternal uncle.   Numerous breast, pancreatic, and prostate cancers.  Prior to Admission medications   Medication Sig Start Date End Date Taking? Authorizing Provider  acetaminophen (TYLENOL) 500 MG tablet Take 1,000 mg by mouth 3 (three) times daily as needed for headache (pain).   Yes [provider]  amLODipine (NORVASC) 10 MG tablet Take 10 mg by mouth daily. 05/11/20  Yes [provider]  atorvastatin (LIPITOR) 20 MG tablet Take 20 mg by mouth daily at 6 PM.    Yes [provider]  gabapentin (NEURONTIN) 300 MG capsule Take 1 capsule (300 mg total) by mouth 3 (three) times daily. 04/06/16  Yes Varney Biles, MD  levETIRAcetam (KEPPRA) 1000 MG tablet Take 1,000 mg by mouth 2 (two) times daily. 07/28/16  Yes [provider]  lisinopril (ZESTRIL) 40 MG tablet Take 40 mg by mouth daily. 08/19/20  Yes [provider]  metFORMIN (GLUCOPHAGE-XR) 500 MG 24 hr tablet Take 500 mg by mouth 2 (two) times daily. 07/27/20  Yes [provider]  omeprazole (PRILOSEC) 20 MG capsule Take 20 mg by mouth daily. 08/10/16  Yes [provider]  albuterol (PROVENTIL HFA;VENTOLIN HFA) 108 (90 BASE) MCG/ACT inhaler Inhale 2 puffs into the lungs every 4 (four) hours as needed for wheezing or shortness of breath. 04/09/15   Muthersbaugh, Jarrett Soho, PA-C  lidocaine-prilocaine (EMLA) cream Apply 1 application topically as directed. Apply 1/2 tablespoon to port site 2 hours prior to stick and cover with plastic wrap to numb site 09/24/20   Ladell Pier, MD  ondansetron (ZOFRAN) 8 MG tablet Take 1 tablet (8 mg total) by mouth every 8 (eight) hours as needed for nausea or vomiting. 09/24/20   Ladell Pier, MD  prochlorperazine (COMPAZINE) 10 MG tablet Take 1 tablet (10 mg total) by mouth every 6 (six) hours as needed. 09/24/20   Ladell Pier, MD       Physical Exam: BP 120/67 (BP Location: Right Arm)   Pulse 71   Temp (!) 97.4 F (36.3 C) (Oral)   Resp 20   SpO2 100%   General appearance: Well-developed, adult male, alert and in no acute distress, somewhat sleepy postanesthesia.   Eyes: Anicteric, conjunctiva pink, lids and lashes normal. PERRL.    ENT: No nasal deformity, discharge, epistaxis.  Hearing normal. OP moist without lesions.  Lips normal, dentition in good repair   Skin: Warm and dry.  No jaundice.  No suspicious rashes or lesions. Cardiac: RRR, nl S1-S2, no murmurs appreciated.  Capillary refill is brisk.  JVP normal.  No LE edema.  Radial pulses 2+ and symmetric. Respiratory: Normal respiratory rate and rhythm.  CTAB without rales or wheezes. Abdomen: Abdomen soft.  No TTP or guarding. No ascites, distension, hepatosplenomegaly.  Ostomy with scant brown stool, site looks clean and dry,  port healing well, old incisions from colectomy appear clean dry and intact MSK: No deformities or effusions of the large joints of the upper or lower extremities bilaterally.  No cyanosis or clubbing. Neuro: Cranial nerves normal.  Sensation intact to light touch. Speech is fluent.  Muscle strength generally weak but symmetric.    Psych: Sensorium intact and responding to questions, attention normal.  Behavior appropriate.  Affect normal.  Judgment and insight appear normal.     Labs on Admission:  I have personally reviewed following labs and imaging studies: CBC: Recent Labs  Lab 10/01/20 1022  WBC 14.7*  HGB 10.2*  HCT 29.5*  MCV 72.7*  PLT 947*   Basic Metabolic Panel: Recent Labs  Lab 10/01/20 1022  NA 129*  K 5.1  CL 104  CO2 15*  GLUCOSE 148*  BUN 88*  CREATININE 4.22*  CALCIUM 9.3   GFR: Estimated Creatinine Clearance: 23.2 mL/min (A) (by C-G formula based on SCr of 4.22 mg/dL (H)).   CBG: Recent Labs  Lab 10/01/20 0840 10/01/20 1657  GLUCAP 155* 111*      Recent Results (from the past 240 hour(s))  SARS CORONAVIRUS 2 (TAT 6-24 HRS) Nasopharyngeal Nasopharyngeal Swab     Status: None   Collection Time: 09/29/20 10:25 AM    Specimen: Nasopharyngeal Swab  Result Value [place] Status   SARS Coronavirus 2 NEGATIVE NEGATIVE Final    Comment: (NOTE) SARS-CoV-2 target nucleic acids are NOT DETECTED.  The SARS-CoV-2 RNA is generally detectable in upper and lower respiratory specimens during the acute phase of infection. Negative results do not preclude SARS-CoV-2 infection, do not rule out co-infections with other pathogens, and should not be used as the sole basis for treatment or other patient management decisions. Negative results must be combined with clinical observations, patient history, and epidemiological information. The expected result is Negative.  Fact Sheet for Patients: SugarRoll.be  Fact Sheet for Healthcare Providers: https://www.woods-mathews.com/  This test is not yet approved or cleared by the [place] FDA and  has been authorized for detection and/or diagnosis of SARS-CoV-2 by FDA under an Emergency Use Authorization (EUA). This EUA will remain  in effect (meaning this test can be used) for the duration of the COVID-19 declaration under Se ction 564(b)(1) of the Act, 21 U.S.C. section 360bbb-3(b)(1), unless the authorization is terminated or revoked sooner.  Performed at [facility], [place] [address], [place], [place] [address]            Radiological Exams on Admission: Personally reviewed chest x-ray shows no edema or opacity: DG CHEST PORT 1 VIEW  Result Date: 10/01/2020 CLINICAL DATA:  Status post port placement. History of colon carcinoma. EXAM: PORTABLE CHEST 1 VIEW COMPARISON:  09/04/2020 FINDINGS: Right jugular port placement with catheter tip at roughly the level of the SVC/RA junction. The heart size and mediastinal contours are within normal limits. There is no evidence of pulmonary edema, consolidation, pneumothorax, nodule or pleural fluid. The visualized skeletal structures are unremarkable. IMPRESSION: Status post  port placement with no evidence of complication by chest x-ray. Catheter tip lies roughly at the SVC/RA junction. Electronically Signed   By: Aletta Edouard M.D.   On: 10/01/2020 10:05   DG Fluoro Guide CV Line-No Report  Result Date: 10/01/2020 Fluoroscopy was utilized by the requesting physician.  No radiographic interpretation.           Assessment/Plan   Acute renal failure Differential includes likely reduced oral intake in setting of  lisinopril use.  Less likely omeprazole AIN.  Less likely obstructive. -Check UA - Check Fina - Give IV fluids - Check serial bladder scans (if urine retention, will obtain renal ultrasound) - Trend creatinine  -Avoid hypotension - Hold NSAIDs and ACE inhibitor   Metabolic acidosis Due to renal failure  Hyponatremia Likely due to dehydration, asymptomatic - IV fluids and trend  Leukocytosis No evidence of infection -monitor      Diabetes, type II, well controlled, with polyneuropathy -Hold home metformin -Sliding scale correction insulin ordered, low-dose - Continue home gabapentin, reduced dose  Hypertension Cerebrovascular disease, secondary prevention BP soft - Hold lisinopril, amlodipine - Hydralazine for severe range pressure -Continue statin  History of epilepsy History of meningioma, status post resection No seizure activity recently - Continue Keppra, renally dose  Microcytic anemia - Check iron studies and if low start oral iron  Port placement Minor procedure, likely will need no post-op care, Surgery will check on patient and be available if needed          DVT prophylaxis: Lovenox  Code Status: FULL  Family Communication: wife by phone  Disposition Plan: Anticipate IV fluids and trend Cr, if improving, possibly home in 2-3 days Consults called: None Admission status: INAPTIENT   At the time of admission, it appears that the appropriate admission status for this patient is INPATIENT. This  is judged to be reasonable and necessary in order to provide the required intensity of service to ensure the patient's safety given: -initial radiographic and laboratory data showing renal failure, acidosis, hyponatremia, anemia -in the context of their chronic comorbidities cancer    Together, these circumstances are felt to place him at high risk for further clinical deterioration threatening life, limb, or organ requiring a high intensity of service due to this acute illness that poses a threat to life, limb or bodily function.  I certify that at the point of admission it is my clinical judgment that the patient will require inpatient hospital care spanning beyond 2 midnights from the point of admission and that early discharge would result in unnecessary risk of decompensation and readmission or threat to life, limb or bodily function.  Medical decision making: Patient seen at 12:34 PM on 10/01/2020.  The patient was discussed with Dr. Bobbye Morton.  What exists of the patient's chart was reviewed in depth and summarized above.  Clinical condition: stsable hemodynamically.        [facility] Please page though [place] or Epic secure chat:  For password, contact charge nurse

## 2020-10-01 NOTE — Op Note (Signed)
   Operative Note   Date: 10/01/2020  Procedure: port-a-cath placement with ultrasound and fluoroscopic guidance  Pre-op diagnosis: colon cancer, adjuvant chemotherapy Post-op diagnosis: same  Indication and clinical history: The patient is a 65 y.o. year old male with colon cancer, s/p resection with plans for adjuvant chemo     Surgeon: [name], MD  Anesthesiologist: Marcie Bal, MD Anesthesia: General  Findings:  . Specimen: none . EBL: <5cc . Drains/Implants: 48F port-a-cath  Disposition: PACU - hemodynamically stable.  Description of procedure: The patient was positioned supine on the operating room table. General anesthetic induction and intubation were uneventful. Foley catheter insertion was performed and was atraumatic. Time-out was performed verifying correct patient, procedure, signature of informed consent, and administration of pre-operative antibiotics. The right chest and neck was prepped and draped in the usual sterile fashion.  Ultrasound guidance was used to localize the right internal jugular vein, which was accessed using an introducer needle. A guidewire was passed via the introducer needle and the needle removed. Ultrasound and fluoroscopy were used to confirm positioning of guidewire. The tract was dilated and the catheter passed through the sheath after removal of the wire and dilator. Fluoroscopy was used to confirm positioning of the catheter. An incision was made in the right upper chest and a pocket created for the port. A tunneler was used to position the proximal end of the catheter into the newly created subcutaneous pocket. The catheter was cut to length, secured to the port, and locked. The port was accessed using a  Huber needle and confirmed to be functional. Fluoroscopy was used to confirm positioning again. The port was secured to the underlying tissues with vicryl sutures. The overlying tissues were re-approximated in layers. The port was again  accessed using a Huber needle and confirmed to be functional. The skin of all incisions was closed with dermabond as sterile dressing.  All sponge and instrument counts were correct at the conclusion of the procedure. The patient was awakened from anesthesia, extubated uneventfully, and transported to the PACU in good condition. There were no complications.   [name], MD General and [facility]

## 2020-10-01 NOTE — Transfer of Care (Signed)
Immediate Anesthesia Transfer of Care Note  Patient: Thomas [name]  Procedure(s) Performed: INSERTION PORT-A-CATH (Right Chest)  Patient Location: PACU  Anesthesia Type:MAC  Level of Consciousness: awake and patient cooperative  Airway & Oxygen Therapy: Patient Spontanous Breathing and Patient connected to nasal cannula oxygen  Post-op Assessment: Report given to RN and Post -op Vital signs reviewed and stable  Post vital signs: Reviewed and stable  Last Vitals:  Vitals Value Taken Time  BP 94/62 10/01/20 0839  Temp 36.2 C 10/01/20 0839  Pulse 72 10/01/20 0842  Resp 18 10/01/20 0842  SpO2 100 % 10/01/20 0842  Vitals shown include unvalidated device data.  Last Pain:  Vitals:   10/01/20 0648  PainSc: 0-No pain      Patients Stated Pain Goal: 5 (85/50/15 8682)  Complications: No complications documented.

## 2020-10-01 NOTE — Anesthesia Procedure Notes (Signed)
Procedure Name: MAC Date/Time: 10/01/2020 7:40 AM Performed by: Renato Shin, CRNA Pre-anesthesia Checklist: Patient identified, Emergency Drugs available, Suction available and Patient being monitored Patient Re-evaluated:Patient Re-evaluated prior to induction Oxygen Delivery Method: Nasal cannula Preoxygenation: Pre-oxygenation with 100% oxygen Induction Type: IV induction Placement Confirmation: positive ETCO2 and breath sounds checked- equal and bilateral Dental Injury: Teeth and Oropharynx as per pre-operative assessment

## 2020-10-01 NOTE — Progress Notes (Signed)
Thomas [name] is an 65 y.o. male.   HPI: Thomas [name] s/p colectomy for colon cancer, presents today for port placement. The patient has had no hospitalizations, ER visits, surgeries, or newly diagnosed allergies since being seen in the office. Seen by Dr. Benay Spice for med-onc and recs for port with plan for adjuvant chemo to begin 6/1.   Past Thomas History:  Diagnosis Date  . Acute ischemic left MCA stroke (Thomas [name]) 04/09/2014  . Acute respiratory failure with hypoxia (Thomas [name]) 04/09/2014  . Aspiration pneumonia (Thomas [name])   . Asthma    as a child  . Brain tumor (Thomas [name])    frontal  . Cancer (Thomas [name])   . Confusion    occasionally  . Diabetes mellitus without complication (Thomas [name])    takes Metformin daily.  . Dizziness   . Dyspnea    pt states d/t weight  . Epilepsy (Thomas [name])    takes Keppra daily  . GERD (gastroesophageal reflux disease)    takes Omeprazole daily  . Headache   . Hyperlipidemia    takes Atorvastatin daily  . Hypertension    takes Lotrel daily  . Peripheral edema    takes Lasix daily  . Peripheral neuropathy    takes Gabapentin daily  . Pneumonia 3 yrs ago   hx of  . Seizures ([name])   . Sleep apnea   . Urinary frequency     Past Surgical History:  Procedure Laterality Date  . BIOPSY  09/05/2020   Procedure: BIOPSY;  Surgeon: Irene Shipper, MD;  Location: [facility] ENDOSCOPY;  Service: Endoscopy;;  . CARDIAC CATHETERIZATION  7 yrs ago  . COLONOSCOPY WITH PROPOFOL N/A 09/05/2020   Procedure: COLONOSCOPY WITH PROPOFOL;  Surgeon: Irene Shipper, MD;  Location: [name] ENDOSCOPY;  Service: Endoscopy;  Laterality: N/A;  . CRANIOTOMY N/A 09/01/2016   Procedure: CRANIOTOMY TUMOR  LEFT PTERIONAL;  Surgeon: Ashok Pall, MD;  Location: [place];  Service: Neurosurgery;  Laterality: N/A;  . cyst removed from chest      as a child  . NO PAST SURGERIES    . PARTIAL COLECTOMY N/A 09/10/2020   Procedure: TRANSVERSE COLECTOMY;  Surgeon: [name], MD;  Location: [name];  Service: General;  Laterality: N/A;   . POLYPECTOMY  09/05/2020   Procedure: POLYPECTOMY;  Surgeon: Irene Shipper, MD;  Location: [name] ENDOSCOPY;  Service: Endoscopy;;  . SUBMUCOSAL TATTOO INJECTION  09/05/2020   Procedure: SUBMUCOSAL TATTOO INJECTION;  Surgeon: Irene Shipper, MD;  Location: [name] ENDOSCOPY;  Service: Endoscopy;;    Family History  Problem Relation Age of Onset  . Breast cancer Mother   . Breast cancer Maternal Aunt   . Prostate cancer Maternal Uncle     Social History:  reports that he has quit smoking. He has never used smokeless tobacco. He reports that he does not drink alcohol and does not use drugs.  Allergies: No Known Allergies  Medications: I have reviewed the patient's current medications.  Results for orders placed or performed during the [name] encounter of 09/29/20 (from the past 48 hour(s))  SARS CORONAVIRUS 2 (TAT 6-24 HRS) Nasopharyngeal Nasopharyngeal Swab     Status: None   Collection Time: 09/29/20 10:25 AM   Specimen: Nasopharyngeal Swab  Result Value Ref [place]   SARS Coronavirus 2 NEGATIVE NEGATIVE    Comment: (NOTE) SARS-CoV-2 target nucleic acids are NOT DETECTED.  The SARS-CoV-2 RNA is generally detectable in upper and lower respiratory specimens during the acute phase of infection. Negative results  do not preclude SARS-CoV-2 infection, do not rule out co-infections with other pathogens, and should not be used as the sole basis for treatment or other patient management decisions. Negative results must be combined with clinical observations, patient history, and epidemiological information. The expected result is Negative.  Fact Sheet for Patients: SugarRoll.be  Fact Sheet for Healthcare Providers: https://www.woods-mathews.com/  This test is not yet approved or cleared by the [place] FDA and  has been authorized for detection and/or diagnosis of SARS-CoV-2 by FDA under an Emergency Use Authorization (EUA). This EUA will remain   in effect (meaning this test can be used) for the duration of the COVID-19 declaration under Se ction 564(b)(1) of the Act, 21 U.S.C. section 360bbb-3(b)(1), unless the authorization is terminated or revoked sooner.  Performed at [name], [place] [address], [name], [place] [address]     No results found.  ROS 10 point review of systems is negative except as listed above in HPI.   Physical Exam Blood pressure (!) 99/49, pulse 80, temperature (!) 97.5 F (36.4 C), resp. rate 20, SpO2 100 %. Constitutional: well-developed, well-nourished HEENT: pupils equal, round, reactive to light, 76mm b/l, moist conjunctiva, external inspection of ears and nose normal, hearing intact Oropharynx: normal oropharyngeal mucosa, normal dentition Neck: no thyromegaly, trachea midline, no midline cervical tenderness to palpation Chest: breath sounds equal bilaterally, normal respiratory effort, no midline or lateral chest wall tenderness to palpation/deformity Abdomen: soft, midline incision with staples and healing incision, ostomy PPP, no bruising, no hepatosplenomegaly GU: no blood at urethral meatus of penis, no scrotal masses or abnormality  Back: no wounds, no thoracic/lumbar spine tenderness to palpation, no thoracic/lumbar spine stepoffs Rectal: deferred Extremities: 2+ radial and pedal pulses bilaterally, motor and sensation intact to bilateral UE and LE, no peripheral edema MSK: normal gait/station, no clubbing/cyanosis of fingers/toes, normal ROM of all four extremities Skin: warm, dry, no rashes Psych: normal memory, normal mood/affect    Assessment/Plan: 20M with colon cancer s/p urgent resection for obstruction. Plan for chemo starting 6/1. To OR today for port placement. Informed consent was obtained after detailed explanation of risks, including bleeding, infection, hemothorax, pneumothorax. All questions answered to the patient's satisfaction.   [name], MD General  and [place] [facility] [facility]

## 2020-10-01 NOTE — Progress Notes (Signed)
Notified by PACU nurse of receipt of lab results drawn 5/23: BUN 80, creatinine 5.26, eGFR 11, WBC 13.8  Results are alarmingly abnormal from last labs drawn in the hospital demonstrating normal renal function. No visible CT imaging in Epic to support contrast nephropathy. Will re-check labs and if similar, will plan for evaluation by nephrology with probable admission. Labs from 5/23 ordered by [name], NP. Called her office and she was aware of the results and had been trying to reach the patient, but unable to make contact, so when noting that he was scheduled for surgery today, faxed his results to the PACU. Dr. Benay Spice, oncologist also notified, as this may impact patient's chemo initiation.   Clinical update provided to wife regarding procedure and lab results. Communicated the possible need for admission based on repeat labs being drawn.   [name], MD General and [facility]

## 2020-10-01 NOTE — Anesthesia Preprocedure Evaluation (Signed)
Anesthesia Evaluation  Patient identified by MRN, date of birth, ID band Patient awake    Reviewed: Allergy & Precautions, H&P , NPO status , Patient's Chart, lab work & pertinent test results  Airway Mallampati: II   Neck ROM: full    Dental   Pulmonary asthma , sleep apnea , former smoker,    breath sounds clear to auscultation       Cardiovascular hypertension,  Rhythm:regular Rate:Normal     Neuro/Psych  Headaches, Seizures -,   Neuromuscular disease CVA    GI/Hepatic GERD  ,Colon CA   Endo/Other  diabetes, Type 2Morbid obesity  Renal/GU      Musculoskeletal   Abdominal   Peds  Hematology   Anesthesia Other Findings   Reproductive/Obstetrics                             Anesthesia Physical Anesthesia Plan  ASA: III  Anesthesia Plan: MAC   Post-op Pain Management:    Induction: Intravenous  PONV Risk Score and Plan: 1 and Ondansetron, Propofol infusion, Midazolam and Treatment may vary due to age or medical condition  Airway Management Planned: Simple Face Mask  Additional Equipment:   Intra-op Plan:   Post-operative Plan:   Informed Consent: I have reviewed the patients History and Physical, chart, labs and discussed the procedure including the risks, benefits and alternatives for the proposed anesthesia with the patient or authorized representative who has indicated his/her understanding and acceptance.     Dental advisory given  Plan Discussed with: CRNA, Anesthesiologist and Surgeon  Anesthesia Plan Comments:         Anesthesia Quick Evaluation

## 2020-10-02 ENCOUNTER — Inpatient Hospital Stay (HOSPITAL_COMMUNITY): Payer: Medicare HMO

## 2020-10-02 ENCOUNTER — Encounter (HOSPITAL_COMMUNITY): Payer: Self-pay | Admitting: Surgery

## 2020-10-02 DIAGNOSIS — N179 Acute kidney failure, unspecified: Principal | ICD-10-CM

## 2020-10-02 DIAGNOSIS — D500 Iron deficiency anemia secondary to blood loss (chronic): Secondary | ICD-10-CM

## 2020-10-02 DIAGNOSIS — I10 Essential (primary) hypertension: Secondary | ICD-10-CM

## 2020-10-02 DIAGNOSIS — Z933 Colostomy status: Secondary | ICD-10-CM

## 2020-10-02 DIAGNOSIS — E1142 Type 2 diabetes mellitus with diabetic polyneuropathy: Secondary | ICD-10-CM

## 2020-10-02 DIAGNOSIS — E118 Type 2 diabetes mellitus with unspecified complications: Secondary | ICD-10-CM

## 2020-10-02 DIAGNOSIS — E871 Hypo-osmolality and hyponatremia: Secondary | ICD-10-CM

## 2020-10-02 LAB — HEMOGLOBIN A1C
Hgb A1c MFr Bld: 7.4 % — ABNORMAL HIGH (ref 4.8–5.6)
Mean Plasma Glucose: 166 mg/dL

## 2020-10-02 LAB — CBC
HCT: 27.9 % — ABNORMAL LOW (ref 39.0–52.0)
Hemoglobin: 9.6 g/dL — ABNORMAL LOW (ref 13.0–17.0)
MCH: 25.1 pg — ABNORMAL LOW (ref 26.0–34.0)
MCHC: 34.4 g/dL (ref 30.0–36.0)
MCV: 73 fL — ABNORMAL LOW (ref 80.0–100.0)
Platelets: 527 10*3/uL — ABNORMAL HIGH (ref 150–400)
RBC: 3.82 MIL/uL — ABNORMAL LOW (ref 4.22–5.81)
RDW: 14.8 % (ref 11.5–15.5)
WBC: 11.8 10*3/uL — ABNORMAL HIGH (ref 4.0–10.5)
nRBC: 0 % (ref 0.0–0.2)

## 2020-10-02 LAB — IRON AND TIBC
Iron: 27 ug/dL — ABNORMAL LOW (ref 45–182)
Saturation Ratios: 10 % — ABNORMAL LOW (ref 17.9–39.5)
TIBC: 263 ug/dL (ref 250–450)
UIBC: 236 ug/dL

## 2020-10-02 LAB — BASIC METABOLIC PANEL
Anion gap: 7 (ref 5–15)
BUN: 74 mg/dL — ABNORMAL HIGH (ref 8–23)
CO2: 19 mmol/L — ABNORMAL LOW (ref 22–32)
Calcium: 8.9 mg/dL (ref 8.9–10.3)
Chloride: 107 mmol/L (ref 98–111)
Creatinine, Ser: 2.75 mg/dL — ABNORMAL HIGH (ref 0.61–1.24)
GFR, Estimated: 25 mL/min — ABNORMAL LOW (ref >60)
Glucose, Bld: 150 mg/dL — ABNORMAL HIGH (ref 70–99)
Potassium: 4.9 mmol/L (ref 3.5–5.1)
Sodium: 133 mmol/L — ABNORMAL LOW (ref 135–145)

## 2020-10-02 LAB — GLUCOSE, CAPILLARY
Glucose-Capillary: 136 mg/dL — ABNORMAL HIGH (ref 70–99)
Glucose-Capillary: 131 mg/dL — ABNORMAL HIGH (ref 70–99)
Glucose-Capillary: 165 mg/dL — ABNORMAL HIGH (ref 70–99)
Glucose-Capillary: 170 mg/dL — ABNORMAL HIGH (ref 70–99)

## 2020-10-02 LAB — FERRITIN: Ferritin: 250 ng/mL (ref 24–336)

## 2020-10-02 MED ORDER — CHLORHEXIDINE GLUCONATE CLOTH 2 % EX PADS
6.0000 | MEDICATED_PAD | Freq: Every day | CUTANEOUS | Status: DC
  Administered 2020-10-02 – 2020-10-03 (×2): 6 via TOPICAL

## 2020-10-02 MED ORDER — AMLODIPINE BESYLATE 10 MG PO TABS
10.0000 mg | ORAL_TABLET | Freq: Every day | ORAL | Status: DC
  Administered 2020-10-03: 10 mg via ORAL
  Filled 2020-10-02: qty 1

## 2020-10-02 MED ORDER — NUTRISOURCE FIBER PO PACK
1.0000 | PACK | Freq: Two times a day (BID) | ORAL | Status: DC
  Administered 2020-10-02 – 2020-10-03 (×3): 1
  Filled 2020-10-02 (×4): qty 1

## 2020-10-02 NOTE — Progress Notes (Signed)
PROGRESS NOTE    Thomas [name]  EQA:[id number] DOB: 1955-07-22 DOA: 10/01/2020 PCP: [name], NP   Brief Narrative: Thomas [name] is a 65 y.o. male with history of diabetes mellitus, hypertension, epilepsy, history of CVA, colon cancer status post recent colectomy with colostomy.  Patient presented secondary to acute renal failure.  Patient started on IV fluids for treatment.   Assessment & Plan:   Principal Problem:   Acute renal failure (ARF) (HCC) Active Problems:   Diabetes mellitus type II, controlled ([place])   Essential hypertension   Diabetic polyneuropathy associated with type 2 diabetes mellitus ([place])   Colostomy in place [facility])   Colon cancer (HCC)   Hyponatremia   Iron deficiency anemia due to chronic blood loss   Metabolic acidosis   Acute renal failure Baseline creatinine of 1.  Patient presented with a creatinine of 4.22.  Possibly related to decreased oral intake in setting of lisinopril use. Creatinine improving with IV fluids.  Patient also came with soft blood pressures and if he had hypotension, this could be contributory as well. -Continue IV fluids -Obtain renal ultrasound -BMP in a.m.  Primary hypertension Lisinopril and amlodipine held on admission secondary to soft blood pressures.  Blood pressure is now improved and normotensive.  Will likely hold lisinopril on discharge secondary to above. -Resume amlodipine  Metabolic acidosis Secondary to renal failure.  Hyponatremia Mild and asymptomatic.  Improvement with IV fluids  Leukocytosis Thrombocytosis Mildly elevated and trending down.  Microcytic anemia Patient with a history of iron deficiency anemia in setting of known colon cancer.  Colon cancer Patient is status post ex lap with transverse colectomy and end colostomy on 09/11/18/2022.  Patient currently follows with oncology, Dr. Benay Spice with plans for chemotherapy starting 10/08/2020.  Patient went for port placement.  Diabetes mellitus,  type II Patient is on metformin as an outpatient.  Last hemoglobin A1c from 2018 up to 8.6%.  Started on SSI while inpatient. -  Seizure disorder -Continue Keppra   DVT prophylaxis: Lovenox Code Status:   Code Status: Full Code Family Communication: None at bedside Disposition Plan: Discharge home likely in 24 hours pending continued improvement of creatinine   Consultants:   None  Procedures:   None  Antimicrobials:  None   Subjective: Patient reports no issues this morning.  He states that he has been having some decreased appetite.  Objective: Vitals:   10/01/20 2057 10/02/20 0216 10/02/20 0514 10/02/20 1341  BP: 118/63 130/65 134/71 129/61  Pulse: 72 83 83 84  Resp: 20 18 18 18   Temp: 98 F (36.7 C) 97.9 F (36.6 C) 98.1 F (36.7 C) (!) 97.4 F (36.3 C)  TempSrc: Oral Oral Oral Oral  SpO2: 93% 99% 100% 100%    Intake/Output Summary (Last 24 hours) at 10/02/2020 1700 Last data filed at 10/02/2020 1626 Gross per 24 hour  Intake 2903.98 ml  Output 2975 ml  Net -71.02 ml   There were no vitals filed for this visit.  Examination:  General exam: Appears calm and comfortable Respiratory system: Clear to auscultation. Respiratory effort normal. Cardiovascular system: S1 & S2 heard, RRR.  Gastrointestinal system: Abdomen is nondistended, soft and nontender. No organomegaly or masses felt. Normal bowel sounds heard.  Colostomy noted with brown stool in bag. Central nervous system: Alert and oriented. No focal neurological deficits. Musculoskeletal: No edema. No calf tenderness Skin: No cyanosis. No rashes Psychiatry: Judgement and insight appear normal. Mood & affect appropriate.     Data  Reviewed: I have personally reviewed following labs and imaging studies  CBC Lab Results  Component Value Date   WBC 11.8 (H) 10/02/2020   RBC 3.82 (L) 10/02/2020   HGB 9.6 (L) 10/02/2020   HCT 27.9 (L) 10/02/2020   MCV 73.0 (L) 10/02/2020   MCH 25.1 (L)  10/02/2020   PLT 527 (H) 10/02/2020   MCHC 34.4 10/02/2020   RDW 14.8 10/02/2020   LYMPHSABS 1.9 09/15/2020   MONOABS 1.3 (H) 09/15/2020   EOSABS 0.6 (H) 09/15/2020   BASOSABS 0.1 09/81/1914     Last metabolic panel Lab Results  Component Value Date   NA 133 (L) 10/02/2020   K 4.9 10/02/2020   CL 107 10/02/2020   CO2 19 (L) 10/02/2020   BUN 74 (H) 10/02/2020   CREATININE 2.75 (H) 10/02/2020   GLUCOSE 150 (H) 10/02/2020   GFRNONAA 25 (L) 10/02/2020   GFRAA >60 04/01/2017   CALCIUM 8.9 10/02/2020   PHOS 2.9 01/30/2015   PROT 7.4 09/05/2020   ALBUMIN 3.3 (L) 09/05/2020   BILITOT 0.8 09/05/2020   ALKPHOS 82 09/05/2020   AST 14 (L) 09/05/2020   ALT 21 09/05/2020   ANIONGAP 7 10/02/2020    CBG (last 3)  Recent Labs    10/01/20 2130 10/02/20 0741 10/02/20 1227  GLUCAP 150* 136* 131*     GFR: Estimated Creatinine Clearance: 35.6 mL/min (A) (by C-G formula based on SCr of 2.75 mg/dL (H)).  Coagulation Profile: No results for input(s): INR, PROTIME in the last 168 hours.  Recent Results (from the past 240 hour(s))  SARS CORONAVIRUS 2 (TAT 6-24 HRS) Nasopharyngeal Nasopharyngeal Swab     Status: None   Collection Time: 09/29/20 10:25 AM   Specimen: Nasopharyngeal Swab  Result Value Ref Range Status   SARS Coronavirus 2 NEGATIVE NEGATIVE Final    Comment: (NOTE) SARS-CoV-2 target nucleic acids are NOT DETECTED.  The SARS-CoV-2 RNA is generally detectable in upper and lower respiratory specimens during the acute phase of infection. Negative results do not preclude SARS-CoV-2 infection, do not rule out co-infections with other pathogens, and should not be used as the sole basis for treatment or other patient management decisions. Negative results must be combined with clinical observations, patient history, and epidemiological information. The expected result is Negative.  Fact Sheet for Patients: SugarRoll.be  Fact Sheet for  Healthcare Providers: https://www.woods-mathews.com/  This test is not yet approved or cleared by the [place] FDA and  has been authorized for detection and/or diagnosis of SARS-CoV-2 by FDA under an Emergency Use Authorization (EUA). This EUA will remain  in effect (meaning this test can be used) for the duration of the COVID-19 declaration under Se ction 564(b)(1) of the Act, 21 U.S.C. section 360bbb-3(b)(1), unless the authorization is terminated or revoked sooner.  Performed at [facility], [place] [address], [place], [place] [address]   Resp Panel by RT-PCR (Flu A&B, Covid) Nasopharyngeal Swab     Status: None   Collection Time: 10/01/20  6:20 PM   Specimen: Nasopharyngeal Swab; Nasopharyngeal(NP) swabs in vial transport medium  Result Value Ref Range Status   SARS Coronavirus 2 by RT PCR NEGATIVE NEGATIVE Final    Comment: (NOTE) SARS-CoV-2 target nucleic acids are NOT DETECTED.  The SARS-CoV-2 RNA is generally detectable in upper respiratory specimens during the acute phase of infection. The lowest concentration of SARS-CoV-2 viral copies this assay can detect is 138 copies/mL. A negative result does not preclude SARS-Cov-2 infection and should not be  used as the sole basis for treatment or other patient management decisions. A negative result may occur with  improper specimen collection/handling, submission of specimen other than nasopharyngeal swab, presence of viral mutation(s) within the areas targeted by this assay, and inadequate number of viral copies(<138 copies/mL). A negative result must be combined with clinical observations, patient history, and epidemiological information. The expected result is Negative.  Fact Sheet for Patients:  EntrepreneurPulse.com.au  Fact Sheet for Healthcare Providers:  IncredibleEmployment.be  This test is no t yet approved or cleared by the [place] FDA and  has been  authorized for detection and/or diagnosis of SARS-CoV-2 by FDA under an Emergency Use Authorization (EUA). This EUA will remain  in effect (meaning this test can be used) for the duration of the COVID-19 declaration under Section 564(b)(1) of the Act, 21 U.S.C.section 360bbb-3(b)(1), unless the authorization is terminated  or revoked sooner.       Influenza A by PCR NEGATIVE NEGATIVE Final   Influenza B by PCR NEGATIVE NEGATIVE Final    Comment: (NOTE) The Xpert Xpress SARS-CoV-2/FLU/RSV plus assay is intended as an aid in the diagnosis of influenza from Nasopharyngeal swab specimens and should not be used as a sole basis for treatment. Nasal washings and aspirates are unacceptable for Xpert Xpress SARS-CoV-2/FLU/RSV testing.  Fact Sheet for Patients: EntrepreneurPulse.com.au  Fact Sheet for Healthcare Providers: IncredibleEmployment.be  This test is not yet approved or cleared by the [place] FDA and has been authorized for detection and/or diagnosis of SARS-CoV-2 by FDA under an Emergency Use Authorization (EUA). This EUA will remain in effect (meaning this test can be used) for the duration of the COVID-19 declaration under Section 564(b)(1) of the Act, 21 U.S.C. section 360bbb-3(b)(1), unless the authorization is terminated or revoked.  Performed at [facility], [place] [address] [place], [place] [address]         Radiology Studies: DG CHEST PORT 1 VIEW  Result Date: 10/01/2020 CLINICAL DATA:  Status post port placement. History of colon carcinoma. EXAM: PORTABLE CHEST 1 VIEW COMPARISON:  09/04/2020 FINDINGS: Right jugular port placement with catheter tip at roughly the level of the SVC/RA junction. The heart size and mediastinal contours are within normal limits. There is no evidence of pulmonary edema, consolidation, pneumothorax, nodule or pleural fluid. The visualized skeletal structures are unremarkable. IMPRESSION:  Status post port placement with no evidence of complication by chest x-ray. Catheter tip lies roughly at the SVC/RA junction. Electronically Signed   By: Aletta Edouard M.D.   On: 10/01/2020 10:05   DG Fluoro Guide CV Line-No Report  Result Date: 10/01/2020 Fluoroscopy was utilized by the requesting physician.  No radiographic interpretation.    Scheduled Meds: . atorvastatin  20 mg Oral q1800  . Chlorhexidine Gluconate Cloth  6 each Topical Daily  . enoxaparin (LOVENOX) injection  30 mg Subcutaneous Q24H  . fiber  1 packet Per Tube BID  . gabapentin  100 mg Oral TID  . insulin aspart  0-5 Units Subcutaneous QHS  . insulin aspart  0-9 Units Subcutaneous TID WC  . levETIRAcetam  500 mg Oral BID   Continuous Infusions: . sodium chloride 100 mL/hr at 10/02/20 1626     LOS: 1 day     Cordelia Poche, MD [facility] 10/02/2020, 5:00 PM  If 7PM-7AM, please contact night-coverage [email or web address]

## 2020-10-02 NOTE — TOC Initial Note (Signed)
Transition of Care [facility]) - Initial/Assessment Note    Patient Details  Name: Thomas [name] MRN: [id number] Date of Birth: 01/05/1956  Transition of [facility] CM/SW Contact:    Marilu Favre, RN Phone Number: 10/02/2020, 1:12 PM  Clinical Narrative:                  Patient from home with wife.   Recent discharge [facility] with [facility] for new ostomy. Patient would like to continue services with [facility]. Left Tommi Rumps with Alvis Lemmings a message. Awaiting call back. Will need resumption of care orders   Patient has walker at home  Expected Discharge Plan: [place]     Patient Goals and CMS Choice Patient states their goals for this hospitalization and ongoing recovery are:: to return to home CMS Medicare.gov Compare Post Acute Care list provided to:: Patient Choice offered to / list presented to : Patient  Expected Discharge Plan and Services Expected Discharge Plan: [place]   Discharge Planning Services: CM Consult Post Acute Care Choice: [place] arrangements for the past 2 months: Single Family Home                 DME Arranged: N/A         HH Arranged: RN [place] Agency: [place] Date [facility] Contacted: 10/02/20 Time HH Agency Contacted: 65 Representative spoke with at [place]: Cory left message  Prior Living Arrangements/Services Living arrangements for the past 2 months: Single Family Home Lives with:: Spouse Patient language and need for interpreter reviewed:: Yes Do you feel safe going back to the place where you live?: Yes      Need for Family Participation in Patient Care: Yes (Comment) Care giver support system in place?: Yes (comment) Current home services: DME Criminal Activity/Legal Involvement Pertinent to Current Situation/Hospitalization: No - Comment as needed  Activities of Daily Living Home Assistive Devices/Equipment: Ostomy supplies ADL Screening (condition at time of  admission) Patient's cognitive ability adequate to safely complete daily activities?: Yes Is the patient deaf or have difficulty hearing?: No Does the patient have difficulty seeing, even when wearing glasses/contacts?: No Does the patient have difficulty concentrating, remembering, or making decisions?: No Patient able to express need for assistance with ADLs?: Yes Does the patient have difficulty dressing or bathing?: No Independently performs ADLs?: Yes (appropriate for developmental age) Does the patient have difficulty walking or climbing stairs?: No Weakness of Legs: None Weakness of Arms/Hands: None  Permission Sought/Granted   Permission granted to share information with : No              Emotional Assessment Appearance:: Appears stated age Attitude/Demeanor/Rapport: Engaged Affect (typically observed): Accepting Orientation: : Oriented to Situation,Oriented to  Time,Oriented to Place,Oriented to Self Alcohol / Substance Use: Not Applicable Psych Involvement: No (comment)  Admission diagnosis:  Acute renal failure (ARF) ([place]) [N17.9] Patient Active Problem List   Diagnosis Date Noted  . Acute renal failure (ARF) ([place]) 10/01/2020  . Hyponatremia 10/01/2020  . Iron deficiency anemia due to chronic blood loss 10/01/2020  . Metabolic acidosis 38/18/2993  . Colon cancer ([place]) 09/23/2020  . Colostomy in place [facility]) 09/11/2020  . History of ischemic left MCA stroke 2015 09/11/2020  . 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   . Rectal polyp   . Diverticulosis of colon without hemorrhage   . Abdominal pain 09/04/2020  . Diabetic polyneuropathy associated with type 2  diabetes mellitus ([place]) 06/28/2018  . Venous insufficiency of both lower extremities 06/28/2018  . S/P craniotomy 09/01/2016  . Meningioma ([place]) 09/01/2016  . Trauma   . Diabetes mellitus type II, controlled ([place])   . Essential hypertension   . Acute encephalopathy  04/09/2014  . Lactic acidosis 04/09/2014  . Hyperammonemia ([place]) 04/09/2014   PCP:  [name], NP Pharmacy:   [facility], [place] [address] Phone: [phone] Fax: [phone]     Social Determinants of Health (SDOH) Interventions    Readmission Risk Interventions No flowsheet data found.

## 2020-10-02 NOTE — Anesthesia Postprocedure Evaluation (Signed)
Anesthesia Post Note  Patient: Thomas [name]  Procedure(s) Performed: INSERTION PORT-A-CATH (Right Chest)     Patient location during evaluation: PACU Anesthesia Type: MAC Level of consciousness: awake and alert Pain management: pain level controlled Vital Signs Assessment: post-procedure vital signs reviewed and stable Respiratory status: spontaneous breathing, nonlabored ventilation, respiratory function stable and patient connected to nasal cannula oxygen Cardiovascular status: stable and blood pressure returned to baseline Postop Assessment: no apparent nausea or vomiting Anesthetic complications: no   No complications documented.  Last Vitals:  Vitals:   10/02/20 0216 10/02/20 0514  BP: 130/65 134/71  Pulse: 83 83  Resp: 18 18  Temp: 36.6 C 36.7 C  SpO2: 99% 100%    Last Pain:  Vitals:   10/02/20 0838  TempSrc:   PainSc: [place]

## 2020-10-02 NOTE — Plan of Care (Signed)
  Problem: Education: Goal: Knowledge of General Education information will improve Description Including pain rating scale, medication(s)/side effects and non-pharmacologic comfort measures Outcome: Progressing   

## 2020-10-03 LAB — BASIC METABOLIC PANEL
Anion gap: 5 (ref 5–15)
BUN: 46 mg/dL — ABNORMAL HIGH (ref 8–23)
CO2: 20 mmol/L — ABNORMAL LOW (ref 22–32)
Calcium: 8.9 mg/dL (ref 8.9–10.3)
Chloride: 115 mmol/L — ABNORMAL HIGH (ref 98–111)
Creatinine, Ser: 1.62 mg/dL — ABNORMAL HIGH (ref 0.61–1.24)
GFR, Estimated: 47 mL/min — ABNORMAL LOW (ref >60)
Glucose, Bld: 156 mg/dL — ABNORMAL HIGH (ref 70–99)
Potassium: 5 mmol/L (ref 3.5–5.1)
Sodium: 140 mmol/L (ref 135–145)

## 2020-10-03 LAB — GLUCOSE, CAPILLARY
Glucose-Capillary: 136 mg/dL — ABNORMAL HIGH (ref 70–99)
Glucose-Capillary: 206 mg/dL — ABNORMAL HIGH (ref 70–99)

## 2020-10-03 NOTE — Progress Notes (Signed)
Patient discharged to home via wheelchair with all belongings.

## 2020-10-03 NOTE — Care Management Important Message (Signed)
Important Message  Patient Details  Name: Thomas [name] MRN: [id number] Date of Birth: 1956-01-23   Medicare Important Message Given:  Yes     Dereonna Lensing [address] 10/03/2020, 1:04 PM

## 2020-10-03 NOTE — Discharge Summary (Signed)
Physician Discharge Summary  Thomas [name] IRC:[id number] DOB: Oct 21, 1955 DOA: 10/01/2020  PCP: [name], NP  Admit date: 10/01/2020 Discharge date: 10/03/2020  Admitted From: Home Disposition: Home  Recommendations for Outpatient Follow-up:  1. Follow up with PCP in 1 week 2. Please obtain BMP/CBC in one week 3. Please follow up on the following pending results: None  Home Health: RN Equipment/Devices: None  Discharge Condition: Stable CODE STATUS: Full code Diet recommendation: Carb modified   Brief/Interim Summary:  Admission HPI written by Edwin Dada, MD   HPI: Thomas [name] is a 65 y.o. M with hx DM, HTN, epilepsy, hx CVA, colon CA s/p recent colectomy and ostomy who presents with acute renal failure.  Patient was admitted last month for bowel obstruction, known to have colon mass, underwent partial transverse colectomy with colostomy on 5/4 by Dr. Bobbye Morton.  Since then, has been doing well.  Saw Dr. Learta Codding and was planning on FOLFOX to start next week, was here for port placement.  While in PACU after his operation, Dr. Bobbye Morton was contacted by his PCP who reported lab work drawn yesterday showed acute renal failure.  Patient reports to me that over the last week or more, he has been reducing his oral and fluid intake because he wanted to reduce his ostomy output.  He had been feeling somewhat tired and fatigued, but no malaise, no anuria, no difficulty urinating, no abdominal pain, no confusion.  He has been taking his normal medicines, including lisinopril.  He has not been taking NSAIDs.   Hospital course:  Acute renal failure Baseline creatinine of 1.  Patient presented with a creatinine of 4.22.  Possibly related to decreased oral intake in setting of lisinopril use. Creatinine improving with IV fluids.  Patient also came with soft blood pressures and if he had hypotension, this could be contributory as well. Creatinine improved well with IV fluids  and holding lisinopril. Creatinine of 1.62 on day of discharge. Renal ultrasound unremarkable for acute process. Will need repeat BMP in on week.  Primary hypertension Lisinopril and amlodipine held on admission secondary to soft blood pressures.  Blood pressure is now improved and normotensive. Continue amlodipine and hold lisinopril until follow-up with PCP  Metabolic acidosis Secondary to renal failure.  Hyponatremia Mild and asymptomatic.  Improvement with IV fluids  Leukocytosis Thrombocytosis Mildly elevated and trending down.  Microcytic anemia Patient with a history of iron deficiency anemia in setting of known colon cancer.  Colon cancer Patient is status post ex lap with transverse colectomy and end colostomy on 09/11/18/2022.  Patient currently follows with oncology, Dr. Benay Spice with plans for chemotherapy starting 10/08/2020.  Patient went for port placement.  Diabetes mellitus, type II Patient is on metformin as an outpatient.  Last hemoglobin A1c from 2018 up to 8.6%.  Started on SSI while inpatient.. Continue metformin on discharge.  Seizure disorder Continue Keppra  Discharge Diagnoses:  Principal Problem:   Acute renal failure (ARF) ([place]) Active Problems:   Diabetes mellitus type II, controlled ([place])   Essential hypertension   Diabetic polyneuropathy associated with type 2 diabetes mellitus ([place])   Colostomy in place [facility])   Colon cancer ([place])   Hyponatremia   Iron deficiency anemia due to chronic blood loss   Metabolic acidosis    Discharge Instructions   Allergies as of 10/03/2020   No Known Allergies     Medication List    STOP taking these medications   lisinopril 40 MG tablet  Commonly known as: ZESTRIL     TAKE these medications   acetaminophen 500 MG tablet Commonly known as: TYLENOL Take 1,000 mg by mouth 3 (three) times daily as needed for headache (pain).   albuterol 108 (90 Base) MCG/ACT inhaler Commonly known as: VENTOLIN  HFA Inhale 2 puffs into the lungs every 4 (four) hours as needed for wheezing or shortness of breath.   amLODipine 10 MG tablet Commonly known as: NORVASC Take 10 mg by mouth daily.   atorvastatin 20 MG tablet Commonly known as: LIPITOR Take 20 mg by mouth daily at 6 PM.   gabapentin 300 MG capsule Commonly known as: Neurontin Take 1 capsule (300 mg total) by mouth 3 (three) times daily.   levETIRAcetam 1000 MG tablet Commonly known as: KEPPRA Take 1,000 mg by mouth 2 (two) times daily.   lidocaine-prilocaine cream Commonly known as: EMLA Apply 1 application topically as directed. Apply 1/2 tablespoon to port site 2 hours prior to stick and cover with plastic wrap to numb site   metFORMIN 500 MG 24 hr tablet Commonly known as: GLUCOPHAGE-XR Take 500 mg by mouth 2 (two) times daily.   omeprazole 20 MG capsule Commonly known as: PRILOSEC Take 20 mg by mouth daily.   ondansetron 8 MG tablet Commonly known as: ZOFRAN Take 1 tablet (8 mg total) by mouth every 8 (eight) hours as needed for nausea or vomiting.   prochlorperazine 10 MG tablet Commonly known as: COMPAZINE Take 1 tablet (10 mg total) by mouth every 6 (six) hours as needed.       Follow-up Information    [facility] Follow up.   Specialty: Home Health Services Contact information: [place] [address] [phone]              No Known Allergies  Consultations:  None   Procedures/Studies: DG Chest 2 View  Result Date: 09/04/2020 CLINICAL DATA:  Chest pain. EXAM: CHEST - 2 VIEW COMPARISON:  04/01/2017. FINDINGS: Mediastinum and hilar structures normal. Heart size normal. No focal infiltrate. No pleural effusion or pneumothorax. No acute bony abnormality. IMPRESSION: No acute cardiopulmonary disease. Electronically Signed   By: Marcello Moores  Register   On: 09/04/2020 07:13   CT CHEST WO CONTRAST  Result Date: 09/09/2020 CLINICAL DATA:  Staging workup of invasive  adenocarcinoma of the transverse colon EXAM: CT CHEST WITHOUT CONTRAST TECHNIQUE: Multidetector CT imaging of the chest was performed following the standard protocol without IV contrast. COMPARISON:  Overlapping portions CT abdomen 09/04/2020; CT chest 04/09/2014 FINDINGS: Cardiovascular: Coronary, aortic arch, and branch vessel atherosclerotic vascular disease. Heart size within normal limits. Aortic valve calcification. Mediastinum/Nodes: Small mediastinal lymph nodes are not pathologically enlarged. Lungs/Pleura: Mild central airway thickening. 0.3 by 0.2 cm subpleural nodule in the right upper lobe along the major fissure on image 57 series 4 Chronic 3 mm calcified nodule in the left lower lobe on image 84 series 4 indicating old granulomatous disease. Upper Abdomen: The adenocarcinoma in the transverse colon is less conspicuous on today's noncontrast exam partially due to small caliber of the adjacent colon. Abdominal aortic atherosclerotic vascular calcification. Fluid density left mid kidney lesion is partially included on today's exam and probably a cyst. Musculoskeletal: Unremarkable IMPRESSION: 1. No specific findings of metastatic disease to the chest. 2. A 3 by 2 mm subpleural nodule in the right upper lobe along the major fissure is highly likely to be a benign subpleural lymph node but may warrant surveillance. 3.  Aortic  Atherosclerosis (ICD10-I70.0).  Coronary atherosclerosis. 4. Airway thickening is present, suggesting bronchitis or reactive airways disease. Electronically Signed   By: Van Clines M.D.   On: 09/09/2020 17:09   CT Abdomen Pelvis W Contrast  Result Date: 09/04/2020 CLINICAL DATA:  65 year old male with abdominal pain and weakness. EXAM: CT ABDOMEN AND PELVIS WITH CONTRAST TECHNIQUE: Multidetector CT imaging of the abdomen and pelvis was performed using the standard protocol following bolus administration of intravenous contrast. CONTRAST:  110mL OMNIPAQUE IOHEXOL 300 MG/ML   SOLN COMPARISON:  CT Abdomen and Pelvis 06/07/2009. FINDINGS: Lower chest: Negative; stable tiny calcified granuloma in the left lower lobe on series 5, image 2. Hepatobiliary: Liver enhancement is within normal limits. Negative gallbladder. No bile duct enlargement. Pancreas: Negative. Spleen: Negative. Adrenals/Urinary Tract: Normal adrenal glands. Kidneys appear little changed since 2011 and within normal limits. A simple fluid density left renal midpole cyst has mildly enlarged since that time. Smaller similar appearing right renal midpole cyst suspected. Symmetric renal enhancement and contrast excretion. No nephrolithiasis. Negative ureters and bladder. Stomach/Bowel: Possible large bowel peristalsis in the mid transverse colon on series 3, image 40. Although there are small but conspicuous regional lymph nodes which are increased from 2011 (coronal image 39. The transverse colon segment and question is about 3.7 cm in length. No dilated upstream large bowel. Negative colon otherwise. Normal retrocecal appendix on series 3, image 57. Decompressed and negative terminal ileum. No dilated small bowel. Decompressed and negative stomach, duodenum. No free air, free fluid. Vascular/Lymphatic: Aortoiliac calcified atherosclerosis. Major arterial structures in the abdomen and pelvis are patent. Normal caliber abdominal aorta. Patent portal venous system. Reproductive: Stable small fat containing left inguinal hernia. Other: No pelvic free fluid. There is mild pelvic lipomatosis (series 3, image 84). Musculoskeletal: No acute osseous abnormality identified. 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 IMPRESSION: 1. Cannot exclude a 3.7 cm length apple-core type Mass/Carcinoma of the mid transverse colon, although artifact due to large bowel peristalsis is possible. Regional lymph nodes are small but increased since 2011. Recommend large bowel  Endoscopy. 2. Otherwise stable, negative CT Abdomen and Pelvis. Aortic Atherosclerosis (ICD10-I70.0). Electronically Signed   By: Genevie Ann M.D.   On: 09/04/2020 08:51   US RENAL  Result Date: 10/02/2020 CLINICAL DATA:  Acute kidney injury EXAM: RENAL / URINARY TRACT ULTRASOUND COMPLETE COMPARISON:  CT 09/04/2020 FINDINGS: Right Kidney: Renal measurements: 9.7 x 7.6 x 5.4 cm = volume: 211.1 mL. Echogenicity within normal limits. No mass or hydronephrosis visualized. Left Kidney: Renal measurements: 12.5 x 5.3 x 4.7 cm = volume: 163.2 mL. Echogenicity within normal limits. No hydronephrosis. Cyst at the midpole measuring 4.4 x 4.2 by 3.9 cm Bladder: Appears normal for degree of bladder distention. Other: None. IMPRESSION: 1. Negative for hydronephrosis. 2. 4.4 cm simple appearing cyst in the left kidney Electronically Signed   By: Donavan Foil M.D.   On: 10/02/2020 20:01   DG CHEST PORT 1 VIEW  Result Date: 10/01/2020 CLINICAL DATA:  Status post port placement. History of colon carcinoma. EXAM: PORTABLE CHEST 1 VIEW COMPARISON:  09/04/2020 FINDINGS: Right jugular port placement with catheter tip at roughly the level of the SVC/RA junction. The heart size and mediastinal contours are within normal limits. There is no evidence of pulmonary edema, consolidation, pneumothorax, nodule or pleural fluid. The visualized skeletal structures are unremarkable. IMPRESSION: Status post port placement with no evidence of complication by chest x-ray. Catheter tip lies  roughly at the SVC/RA junction. Electronically Signed   By: Aletta Edouard M.D.   On: 10/01/2020 10:05   DG Fluoro Guide CV Line-No Report  Result Date: 10/01/2020 Fluoroscopy was utilized by the requesting physician.  No radiographic interpretation.      Subjective: No issues overnight  Discharge Exam: Vitals:   10/03/20 0600 10/03/20 0658  BP: 112/62 127/83  Pulse: 86 (!) 51  Resp: 18 18  Temp: 98.6 F (37 C) 98 F (36.7 C)  SpO2: 99%  99%   Vitals:   10/02/20 1341 10/02/20 2123 10/03/20 0600 10/03/20 0658  BP: 129/61 (!) 110/58 112/62 127/83  Pulse: 84 89 86 (!) 51  Resp: 18 18 18 18   Temp: (!) 97.4 F (36.3 C) 98.5 F (36.9 C) 98.6 F (37 C) 98 F (36.7 C)  TempSrc: Oral Oral Oral   SpO2: 100% 98% 99% 99%    General: Pt is alert, awake, not in acute distress Cardiovascular: RRR, S1/S2 +, no rubs, no gallops Respiratory: CTA bilaterally, no wheezing, no rhonchi Abdominal: Soft, NT, ND, bowel sounds + Extremities: no edema, no cyanosis    The results of significant diagnostics from this hospitalization (including imaging, microbiology, ancillary and laboratory) are listed below for reference.     Microbiology: Recent Results (from the past 240 hour(s))  SARS CORONAVIRUS 2 (TAT 6-24 HRS) Nasopharyngeal Nasopharyngeal Swab     Status: None   Collection Time: 09/29/20 10:25 AM   Specimen: Nasopharyngeal Swab  Result Value Ref Range Status   SARS Coronavirus 2 NEGATIVE NEGATIVE Final    Comment: (NOTE) SARS-CoV-2 target nucleic acids are NOT DETECTED.  The SARS-CoV-2 RNA is generally detectable in upper and lower respiratory specimens during the acute phase of infection. Negative results do not preclude SARS-CoV-2 infection, do not rule out co-infections with other pathogens, and should not be used as the sole basis for treatment or other patient management decisions. Negative results must be combined with clinical observations, patient history, and epidemiological information. The expected result is Negative.  Fact Sheet for Patients: SugarRoll.be  Fact Sheet for Healthcare Providers: https://www.woods-mathews.com/  This test is not yet approved or cleared by the [place] FDA and  has been authorized for detection and/or diagnosis of SARS-CoV-2 by FDA under an Emergency Use Authorization (EUA). This EUA will remain  in effect (meaning this test can be  used) for the duration of the COVID-19 declaration under Se ction 564(b)(1) of the Act, 21 U.S.C. section 360bbb-3(b)(1), unless the authorization is terminated or revoked sooner.  Performed at [facility], [place] [address], [place], [place] [address]   Resp Panel by RT-PCR (Flu A&B, Covid) Nasopharyngeal Swab     Status: None   Collection Time: 10/01/20  6:20 PM   Specimen: Nasopharyngeal Swab; Nasopharyngeal(NP) swabs in vial transport medium  Result Value Ref Range Status   SARS Coronavirus 2 by RT PCR NEGATIVE NEGATIVE Final    Comment: (NOTE) SARS-CoV-2 target nucleic acids are NOT DETECTED.  The SARS-CoV-2 RNA is generally detectable in upper respiratory specimens during the acute phase of infection. The lowest concentration of SARS-CoV-2 viral copies this assay can detect is 138 copies/mL. A negative result does not preclude SARS-Cov-2 infection and should not be used as the sole basis for treatment or other patient management decisions. A negative result may occur with  improper specimen collection/handling, submission of specimen other than nasopharyngeal swab, presence of viral mutation(s) within the areas targeted by this assay, and inadequate number of  viral copies(<138 copies/mL). A negative result must be combined with clinical observations, patient history, and epidemiological information. The expected result is Negative.  Fact Sheet for Patients:  EntrepreneurPulse.com.au  Fact Sheet for Healthcare Providers:  IncredibleEmployment.be  This test is no t yet approved or cleared by the [place] FDA and  has been authorized for detection and/or diagnosis of SARS-CoV-2 by FDA under an Emergency Use Authorization (EUA). This EUA will remain  in effect (meaning this test can be used) for the duration of the COVID-19 declaration under Section 564(b)(1) of the Act, 21 U.S.C.section 360bbb-3(b)(1), unless the authorization is  terminated  or revoked sooner.       Influenza A by PCR NEGATIVE NEGATIVE Final   Influenza B by PCR NEGATIVE NEGATIVE Final    Comment: (NOTE) The Xpert Xpress SARS-CoV-2/FLU/RSV plus assay is intended as an aid in the diagnosis of influenza from Nasopharyngeal swab specimens and should not be used as a sole basis for treatment. Nasal washings and aspirates are unacceptable for Xpert Xpress SARS-CoV-2/FLU/RSV testing.  Fact Sheet for Patients: EntrepreneurPulse.com.au  Fact Sheet for Healthcare Providers: IncredibleEmployment.be  This test is not yet approved or cleared by the [place] FDA and has been authorized for detection and/or diagnosis of SARS-CoV-2 by FDA under an Emergency Use Authorization (EUA). This EUA will remain in effect (meaning this test can be used) for the duration of the COVID-19 declaration under Section 564(b)(1) of the Act, 21 U.S.C. section 360bbb-3(b)(1), unless the authorization is terminated or revoked.  Performed at [facility], [place] [address], [place], [place] [address]      Labs: BNP (last 3 results) No results for input(s): BNP in the last 8760 hours. Basic Metabolic Panel: Recent Labs  Lab 10/01/20 1022 10/02/20 0032 10/03/20 0150  NA 129* 133* 140  K 5.1 4.9 5.0  CL 104 107 115*  CO2 15* 19* 20*  GLUCOSE 148* 150* 156*  BUN 88* 74* 46*  CREATININE 4.22* 2.75* 1.62*  CALCIUM 9.3 8.9 8.9   Liver Function Tests: No results for input(s): AST, ALT, ALKPHOS, BILITOT, PROT, ALBUMIN in the last 168 hours. No results for input(s): LIPASE, AMYLASE in the last 168 hours. No results for input(s): AMMONIA in the last 168 hours. CBC: Recent Labs  Lab 10/01/20 1022 10/02/20 0032  WBC 14.7* 11.8*  HGB 10.2* 9.6*  HCT 29.5* 27.9*  MCV 72.7* 73.0*  PLT 529* 527*   Cardiac Enzymes: No results for input(s): CKTOTAL, CKMB, CKMBINDEX, TROPONINI in the last 168 hours. BNP: Invalid  input(s): POCBNP CBG: Recent Labs  Lab 10/02/20 0741 10/02/20 1227 10/02/20 1716 10/02/20 2125 10/03/20 0740  GLUCAP 136* 131* 165* 170* 136*   D-Dimer No results for input(s): DDIMER in the last 72 hours. Hgb A1c Recent Labs    10/02/20 0032  HGBA1C 7.4*   Lipid Profile No results for input(s): CHOL, HDL, LDLCALC, TRIG, CHOLHDL, LDLDIRECT in the last 72 hours. Thyroid function studies No results for input(s): TSH, T4TOTAL, T3FREE, THYROIDAB in the last 72 hours.  Invalid input(s): FREET3 Anemia work up Recent Labs    10/02/20 0032  FERRITIN 250  TIBC 263  IRON 27*   Urinalysis    Component Value Date/Time   COLORURINE YELLOW 10/01/2020 1640   APPEARANCEUR CLEAR 10/01/2020 1640   LABSPEC 1.012 10/01/2020 1640   PHURINE 5.0 10/01/2020 1640   GLUCOSEU NEGATIVE 10/01/2020 1640   HGBUR SMALL (A) 10/01/2020 1640   BILIRUBINUR NEGATIVE 10/01/2020 1640   KETONESUR NEGATIVE  10/01/2020 1640   PROTEINUR NEGATIVE 10/01/2020 1640   UROBILINOGEN 0.2 09/27/2014 0301   NITRITE NEGATIVE 10/01/2020 1640   LEUKOCYTESUR NEGATIVE 10/01/2020 1640   Sepsis Labs Invalid input(s): PROCALCITONIN,  WBC,  LACTICIDVEN Microbiology Recent Results (from the past 240 hour(s))  SARS CORONAVIRUS 2 (TAT 6-24 HRS) Nasopharyngeal Nasopharyngeal Swab     Status: None   Collection Time: 09/29/20 10:25 AM   Specimen: Nasopharyngeal Swab  Result Value Ref Range Status   SARS Coronavirus 2 NEGATIVE NEGATIVE Final    Comment: (NOTE) SARS-CoV-2 target nucleic acids are NOT DETECTED.  The SARS-CoV-2 RNA is generally detectable in upper and lower respiratory specimens during the acute phase of infection. Negative results do not preclude SARS-CoV-2 infection, do not rule out co-infections with other pathogens, and should not be used as the sole basis for treatment or other patient management decisions. Negative results must be combined with clinical observations, patient history, and  epidemiological information. The expected result is Negative.  Fact Sheet for Patients: SugarRoll.be  Fact Sheet for Healthcare Providers: https://www.woods-mathews.com/  This test is not yet approved or cleared by the [place] FDA and  has been authorized for detection and/or diagnosis of SARS-CoV-2 by FDA under an Emergency Use Authorization (EUA). This EUA will remain  in effect (meaning this test can be used) for the duration of the COVID-19 declaration under Se ction 564(b)(1) of the Act, 21 U.S.C. section 360bbb-3(b)(1), unless the authorization is terminated or revoked sooner.  Performed at [facility], [place] [address] [place], [place] [address]   Resp Panel by RT-PCR (Flu A&B, Covid) Nasopharyngeal Swab     Status: None   Collection Time: 10/01/20  6:20 PM   Specimen: Nasopharyngeal Swab; Nasopharyngeal(NP) swabs in vial transport medium  Result Value Ref Range Status   SARS Coronavirus 2 by RT PCR NEGATIVE NEGATIVE Final    Comment: (NOTE) SARS-CoV-2 target nucleic acids are NOT DETECTED.  The SARS-CoV-2 RNA is generally detectable in upper respiratory specimens during the acute phase of infection. The lowest concentration of SARS-CoV-2 viral copies this assay can detect is 138 copies/mL. A negative result does not preclude SARS-Cov-2 infection and should not be used as the sole basis for treatment or other patient management decisions. A negative result may occur with  improper specimen collection/handling, submission of specimen other than nasopharyngeal swab, presence of viral mutation(s) within the areas targeted by this assay, and inadequate number of viral copies(<138 copies/mL). A negative result must be combined with clinical observations, patient history, and epidemiological information. The expected result is Negative.  Fact Sheet for Patients:  EntrepreneurPulse.com.au  Fact Sheet for  Healthcare Providers:  IncredibleEmployment.be  This test is no t yet approved or cleared by the [place] FDA and  has been authorized for detection and/or diagnosis of SARS-CoV-2 by FDA under an Emergency Use Authorization (EUA). This EUA will remain  in effect (meaning this test can be used) for the duration of the COVID-19 declaration under Section 564(b)(1) of the Act, 21 U.S.C.section 360bbb-3(b)(1), unless the authorization is terminated  or revoked sooner.       Influenza A by PCR NEGATIVE NEGATIVE Final   Influenza B by PCR NEGATIVE NEGATIVE Final    Comment: (NOTE) The Xpert Xpress SARS-CoV-2/FLU/RSV plus assay is intended as an aid in the diagnosis of influenza from Nasopharyngeal swab specimens and should not be used as a sole basis for treatment. Nasal washings and aspirates are unacceptable for Xpert Xpress SARS-CoV-2/FLU/RSV testing.  Fact  Sheet for Patients: EntrepreneurPulse.com.au  Fact Sheet for Healthcare Providers: IncredibleEmployment.be  This test is not yet approved or cleared by the [place] FDA and has been authorized for detection and/or diagnosis of SARS-CoV-2 by FDA under an Emergency Use Authorization (EUA). This EUA will remain in effect (meaning this test can be used) for the duration of the COVID-19 declaration under Section 564(b)(1) of the Act, 21 U.S.C. section 360bbb-3(b)(1), unless the authorization is terminated or revoked.  Performed at [facility], [place] [address], [place], [place] [address]      Time coordinating discharge: 35 minutes  SIGNED:   Cordelia Poche, MD [facility] 10/03/2020, 9:00 AM

## 2020-10-03 NOTE — Discharge Instructions (Signed)
Thomas [name],  You were in the hospital with injury to your kidneys. This was likely caused by dehydration and concurrent use of lisinopril. I recommend holding lisinopril until you see your primary care physician and keeping well hydrated.

## 2020-10-03 NOTE — Plan of Care (Signed)
  Problem: Education: Goal: Knowledge of General Education information will improve Description: Including pain rating scale, medication(s)/side effects and non-pharmacologic comfort measures Outcome: Adequate for Discharge   

## 2020-10-03 NOTE — Progress Notes (Signed)
Discharge instructions (including medications) discussed with and copy provided to patient/caregiver 

## 2020-10-05 ENCOUNTER — Other Ambulatory Visit: Payer: Self-pay | Admitting: Oncology

## 2020-10-05 MED ORDER — CAPECITABINE 500 MG PO TABS
ORAL_TABLET | ORAL | 0 refills | Status: DC
  Filled 2020-10-05: qty 112, fill #0

## 2020-10-07 ENCOUNTER — Telehealth: Payer: Self-pay | Admitting: Pharmacist

## 2020-10-07 ENCOUNTER — Inpatient Hospital Stay: Payer: Medicare HMO | Admitting: Dietician

## 2020-10-07 ENCOUNTER — Other Ambulatory Visit (HOSPITAL_COMMUNITY): Payer: Self-pay

## 2020-10-07 ENCOUNTER — Other Ambulatory Visit: Payer: Self-pay

## 2020-10-07 ENCOUNTER — Telehealth: Payer: Self-pay | Admitting: Pharmacy Technician

## 2020-10-07 ENCOUNTER — Encounter: Payer: Self-pay | Admitting: Oncology

## 2020-10-07 DIAGNOSIS — C184 Malignant neoplasm of transverse colon: Secondary | ICD-10-CM

## 2020-10-07 MED ORDER — CAPECITABINE 500 MG PO TABS
2000.0000 mg | ORAL_TABLET | Freq: Two times a day (BID) | ORAL | 0 refills | Status: DC
  Filled 2020-10-07: qty 112, 21d supply, fill #0
  Filled 2020-10-07: qty 112, 14d supply, fill #0

## 2020-10-07 NOTE — Telephone Encounter (Signed)
Oral Oncology Pharmacist Encounter  Received new prescription for Xeloda (capecitabine) for the adjuvant treatment of well differentiated stage IIIb colon adenocarcinoma  in conjunction with oxaliplatin, planned duration of 3 months.  BMP from 10/03/20 assessed, no relevant lab abnormalities. Prescription dose and frequency assessed.   Current medication list in Epic reviewed, one DDIs with capecitabine identified: - Omeprazole: Proton Pump Inhibitors (PPI) may diminish the therapeutic effect of capecitabine, varying information on the clinical impact. Recommend evaluating the need for a PPI/acid suppression. If acid suppression is needed, attempt switching to a H2 antagonist (eg, famotidine) if possible.   Evaluated chart and no patient barriers to medication adherence identified.   Prescription has been e-scribed to the [facility] for benefits analysis and approval.  Oral Oncology Clinic will continue to follow for insurance authorization, copayment issues, initial counseling and start date.  Patient agreed to treatment on 09/23/20 per MD documentation.  Darl Pikes, PharmD, BCPS, BCOP, CPP Hematology/Oncology Clinical Pharmacist Practitioner [facility] [phone]  10/07/2020 9:03 AM

## 2020-10-07 NOTE — Progress Notes (Signed)
Nutrition Assessment   Reason for Assessment: pt request   ASSESSMENT: 65 year old male with stage IIIb colon cancer. 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 Plans to start adjuvant chemotherapy with CAPOX on 6/1. Patient is followed by Dr. Benay Spice.  Past medical history of DM2, HTN, epilepsy, diabetic neuropathy, ischemic left MCA stroke, meningioma s/p craniotomy. Noted recent hospital admission 5/25-5/27 due to AKI.   Met with pt in clinic. He reports feeling better since hospital discharge. Patient reports he was limiting fluids and foods due to high ostomy output resulting in dehydration. Patient reports he has been eating 2 meals/day, snacks on fruit. He is drinking Gatorade Zero and water. Patient reports usually eating oatmeal cup topped with raisins and apples from [employer] for breakfast and eats balanced meals for dinner, recalls [place], collards, rice. Patient and his wife and recently moved in with their son who will usually prepares dinner. Patient asking questions about eating certain foods that he enjoys such as cheezits, hotdogs, fried foods, hibachi chx/steak/shrimp, broccoli, rice, noodles.   Medications: Compazine, Zofran, Prilosec, Keppra, Gabapentin, Xeloda, Metformin   Labs: 5/27 Glucose 156, BUN 46, Cr 1.62 5/26 A1c - 7.4  Anthropometrics: Weights have decreased 37 lbs (11.5%) s/p Hartmann resection/colostomy on 5/4 and he is 52 lbs (16%) under reported usual weight. This is significant  Height: 5'8" Weight: 278 lb UBW: 330 lb (per pt) BMI: 42.27   Estimated Energy Needs  Kcals: 2200-2450 Protein: 125-140 Fluid: 2.2 L  NUTRITION DIAGNOSIS: Food and nutrition related knowledge deficit due to cancer and associated treatments as evidenced by no prior need for related nutrition education   INTERVENTION:  Educated on the importance of adequate calories and protein to maintain strength/weights, and nutrition  Educated on eating 6-8 small meals  daily with focus on protein Discussed importance of adequate fluid intake, recommended 8-10 glasses/day Discussed nutrition after colostomy - educated on low fiber diet, foods to limit/avoid, foods that increase output/side effects of food choices, handout provided Educated on cold sensitivity side effects of Oxyliplatin, handout provided RD contact information given   MONITORING, EVALUATION, GOAL: weight trends, intake   Next Visit: f/u via telephone Friday June 17

## 2020-10-07 NOTE — Telephone Encounter (Signed)
Oral Oncology Patient Advocate Encounter  I spoke with Mr Hoen this afternoon to schedule first fill of Xeloda.  Xeloda will be filled through [facility] and patient will pick up after his appt with Nutrition today.  Patient knows to start tomorrow, 10/08/20.  [place] will call 7-10 days before next refill is due to complete adherence call and set up delivery of medication.     [place] Patient [place] Phone [phone] Fax [phone] 10/07/2020 12:38 PM

## 2020-10-07 NOTE — Telephone Encounter (Signed)
Oral Oncology Patient Advocate Encounter  Received notification from [facility] that prior authorization for Xeloda is required.  PA submitted on CoverMyMeds Key [id number] Status is pending  Oral Oncology Clinic will continue to follow.  [place] Patient [place] Phone [phone] Fax [phone] 10/07/2020 10:04 AM

## 2020-10-07 NOTE — Telephone Encounter (Signed)
Oral Chemotherapy Pharmacist Encounter  Thomas [name] will pick up his Xeloda from [place] today. He knows the plan is to get started tomorrow along with his infusion appointment.  Patient Education I spoke with patient for overview of new oral chemotherapy medication: Xeloda (capecitabine) for the adjuvant treatment of well differentiated stage IIIb colon adenocarcinoma  in conjunction with oxaliplatin, planned duration of 3 months.   Pt is doing well. Counseled patient on administration, dosing, side effects, monitoring, drug-food interactions, safe handling, storage, and disposal. Patient will take 4 tablets (2,000 mg total) by mouth 2 (two) times daily after a meal. Take for 14 days, then hold for 7 days. Repeat every 21 days.  Side effects include but not limited to: diarrhea, hand-foot syndrome, edema, decreased wbc, fatigue, N/V   Reviewed with patient importance of keeping a medication schedule and plan for any missed doses.  After discussion with patient no patient barriers to medication adherence identified. His wife helps him with his medication  Thomas [name] voiced understanding and appreciation. All questions answered. Medication handout and calendar provided.  Provided patient with [facility] phone number. Patient knows to call the office with questions or concerns. Oral Chemotherapy Navigation Clinic will continue to follow.  Darl Pikes, PharmD, BCPS, BCOP, CPP Hematology/Oncology Clinical Pharmacist Practitioner [facility] [phone]  10/07/2020 12:27 PM

## 2020-10-07 NOTE — Telephone Encounter (Signed)
Oral Oncology Patient Advocate Encounter  Prior Authorization for Xeloda has been approved through Medicare Part B.  PA# [id number] Effective dates: 10/07/20 through 05/09/21  Patients co-pay is $22.83  Oral Oncology Clinic will continue to follow.   [place] Patient [place] Phone [phone] Fax [phone] 10/07/2020 11:28 AM

## 2020-10-08 ENCOUNTER — Inpatient Hospital Stay: Payer: Medicare HMO

## 2020-10-08 ENCOUNTER — Inpatient Hospital Stay (HOSPITAL_BASED_OUTPATIENT_CLINIC_OR_DEPARTMENT_OTHER): Payer: Medicare HMO | Admitting: Oncology

## 2020-10-08 ENCOUNTER — Inpatient Hospital Stay: Payer: Medicare HMO | Attending: Oncology

## 2020-10-08 VITALS — BP 131/79 | HR 77 | Temp 98.5°F | Resp 24 | Wt 271.6 lb

## 2020-10-08 VITALS — BP 150/89 | HR 85 | Temp 98.1°F | Resp 20

## 2020-10-08 DIAGNOSIS — Z79899 Other long term (current) drug therapy: Secondary | ICD-10-CM | POA: Diagnosis not present

## 2020-10-08 DIAGNOSIS — C184 Malignant neoplasm of transverse colon: Secondary | ICD-10-CM | POA: Insufficient documentation

## 2020-10-08 DIAGNOSIS — Z5111 Encounter for antineoplastic chemotherapy: Secondary | ICD-10-CM | POA: Insufficient documentation

## 2020-10-08 LAB — CBC WITH DIFFERENTIAL (CANCER CENTER ONLY)
Abs Immature Granulocytes: 0.17 10*3/uL — ABNORMAL HIGH (ref 0.00–0.07)
Basophils Absolute: 0.1 10*3/uL (ref 0.0–0.1)
Basophils Relative: 1 %
Eosinophils Absolute: 0.5 10*3/uL (ref 0.0–0.5)
Eosinophils Relative: 3 %
HCT: 30.7 % — ABNORMAL LOW (ref 39.0–52.0)
Hemoglobin: 10.4 g/dL — ABNORMAL LOW (ref 13.0–17.0)
Immature Granulocytes: 1 %
Lymphocytes Relative: 18 %
Lymphs Abs: 2.5 10*3/uL (ref 0.7–4.0)
MCH: 24.9 pg — ABNORMAL LOW (ref 26.0–34.0)
MCHC: 33.9 g/dL (ref 30.0–36.0)
MCV: 73.6 fL — ABNORMAL LOW (ref 80.0–100.0)
Monocytes Absolute: 1.3 10*3/uL — ABNORMAL HIGH (ref 0.1–1.0)
Monocytes Relative: 9 %
Neutro Abs: 9.8 10*3/uL — ABNORMAL HIGH (ref 1.7–7.7)
Neutrophils Relative %: 68 %
Platelet Count: 450 10*3/uL — ABNORMAL HIGH (ref 150–400)
RBC: 4.17 MIL/uL — ABNORMAL LOW (ref 4.22–5.81)
RDW: 15.2 % (ref 11.5–15.5)
WBC Count: 14.3 10*3/uL — ABNORMAL HIGH (ref 4.0–10.5)
nRBC: 0 % (ref 0.0–0.2)

## 2020-10-08 LAB — CMP (CANCER CENTER ONLY)
ALT: 22 U/L (ref 0–44)
AST: 15 U/L (ref 15–41)
Albumin: 3.9 g/dL (ref 3.5–5.0)
Alkaline Phosphatase: 56 U/L (ref 38–126)
Anion gap: 12 (ref 5–15)
BUN: 18 mg/dL (ref 8–23)
CO2: 21 mmol/L — ABNORMAL LOW (ref 22–32)
Calcium: 9.1 mg/dL (ref 8.9–10.3)
Chloride: 104 mmol/L (ref 98–111)
Creatinine: 1.34 mg/dL — ABNORMAL HIGH (ref 0.61–1.24)
GFR, Estimated: 60 mL/min — ABNORMAL LOW (ref >60)
Glucose, Bld: 107 mg/dL — ABNORMAL HIGH (ref 70–99)
Potassium: 4 mmol/L (ref 3.5–5.1)
Sodium: 137 mmol/L (ref 135–145)
Total Bilirubin: 0.3 mg/dL (ref 0.3–1.2)
Total Protein: 7.6 g/dL (ref 6.5–8.1)

## 2020-10-08 LAB — CEA (ACCESS): CEA (CHCC): <1 ng/mL (ref 0.00–5.00)

## 2020-10-08 MED ORDER — DEXTROSE 5 % IV SOLN
Freq: Once | INTRAVENOUS | Status: AC
  Filled 2020-10-08: qty 250

## 2020-10-08 MED ORDER — OXALIPLATIN CHEMO INJECTION 100 MG/20ML
100.0000 mg/m2 | Freq: Once | INTRAVENOUS | Status: AC
  Administered 2020-10-08: 245 mg via INTRAVENOUS
  Filled 2020-10-08: qty 49

## 2020-10-08 MED ORDER — SODIUM CHLORIDE 0.9 % IV SOLN
10.0000 mg | Freq: Once | INTRAVENOUS | Status: AC
  Administered 2020-10-08: 10 mg via INTRAVENOUS
  Filled 2020-10-08: qty 1

## 2020-10-08 MED ORDER — SODIUM CHLORIDE 0.9% FLUSH
10.0000 mL | INTRAVENOUS | Status: DC | PRN
  Administered 2020-10-08: 10 mL
  Filled 2020-10-08: qty 10

## 2020-10-08 MED ORDER — PALONOSETRON HCL INJECTION 0.25 MG/5ML
0.2500 mg | Freq: Once | INTRAVENOUS | Status: AC
  Administered 2020-10-08: 0.25 mg via INTRAVENOUS
  Filled 2020-10-08: qty 5

## 2020-10-08 MED ORDER — HEPARIN SOD (PORK) LOCK FLUSH 100 UNIT/ML IV SOLN
500.0000 [IU] | Freq: Once | INTRAVENOUS | Status: AC | PRN
  Administered 2020-10-08: 500 [IU]
  Filled 2020-10-08: qty 5

## 2020-10-08 NOTE — Progress Notes (Signed)
  [facility] OFFICE PROGRESS NOTE   Diagnosis: Colon cancer  INTERVAL HISTORY:   Mr. Staffa underwent Port-A-Cath placement on 10/01/2020.  He was noted to have a markedly elevated creatinine.  He was admitted for further evaluation.  He states that he avoided oral intake over the days prior to hospital admission in order to reduce output in the ostomy.  The creatinine improved with intravenous hydration.  He is now drinking adequate fluids.  He reports the stool is sometimes formed.  He feels well at present.  He began capecitabine this morning.  Objective:  Vital signs in last 24 hours:  Blood pressure 131/79, pulse 77, temperature 98.5 F (36.9 C), temperature source Oral, resp. rate (!) 24, weight 271 lb 9.6 oz (123.2 kg), SpO2 100 %.   Resp: Lungs clear bilaterally Cardio: Regular rate and rhythm GI: Healed surgical incisions, nontender, no hepatomegaly, right abdomen colostomy with loose brown stool Vascular: No leg edema Skin: Dryness over the trunk and extremities  Portacath/PICC-without erythema  Lab Results:  Lab Results  Component Value Date   WBC 14.3 (H) 10/08/2020   HGB 10.4 (L) 10/08/2020   HCT 30.7 (L) 10/08/2020   MCV 73.6 (L) 10/08/2020   PLT 450 (H) 10/08/2020   NEUTROABS 9.8 (H) 10/08/2020    CMP  Lab Results  Component Value Date   NA 140 10/03/2020   K 5.0 10/03/2020   CL 115 (H) 10/03/2020   CO2 20 (L) 10/03/2020   GLUCOSE 156 (H) 10/03/2020   BUN 46 (H) 10/03/2020   CREATININE 1.62 (H) 10/03/2020   CALCIUM 8.9 10/03/2020   PROT 7.4 09/05/2020   ALBUMIN 3.3 (L) 09/05/2020   AST 14 (L) 09/05/2020   ALT 21 09/05/2020   ALKPHOS 82 09/05/2020   BILITOT 0.8 09/05/2020   GFRNONAA 47 (L) 10/03/2020   GFRAA >60 04/01/2017    Lab Results  Component Value Date   CEA1 1.7 09/05/2020    Medications: I have reviewed the patient's current medications.   Assessment/Plan: 1. Transverse colon cancer, stage IIIb (T3N1a), status post  a partial transverse colectomy and end colostomy 09/10/2020  CT abdomen/pelvis 09/04/2020- possible transverse colon mass with small regional lymph nodes  Colonoscopy 09/05/2020- obstructing transverse colon mass-biopsy invasive adenocarcinoma, mass could not be passed  CT chest 09/09/2020-no evidence of metastatic disease, 3 x 2 mm subpleural nodule in the right upper lobe likely benign subpleural lymph node  Partial transverse colectomy 09/10/2020, transverse colon tumor, no lymphovascular perineural invasion, 1/19 lymph nodes positive, negative margins, MSI stable, no loss of mismatch repair protein expression  Cycle 1 CAPOX 10/08/2020, oxaliplatin dose reduced to 100 mg per metered squared secondary to renal insufficiency 2. Resection of a frontal meningioma 09/01/2016 3. Diabetes 4. Rectal polyp- tubular adenoma on colonoscopy 09/05/2020 5. Hypertension 6. Diabetic neuropathy 7. Epilepsy 8. Family history of breast and prostate cancer 9. Admission with acute renal failure 10/01/2020-secondary to lack of oral intake and lisinopril, improved with intravenous hydration and holding lisinopril     Disposition: Mr. Plush appears well.  The creatinine has returned to baseline.  He appears to have mild chronic renal failure.  The plan is to begin adjuvant chemotherapy today.  He will continue increased fluid intake.  He will call for new symptoms.  Mr. Dillion will return for an office visit and cycle 2 CAPOX in 3 weeks.  Betsy Coder, MD  10/08/2020  10:58 AM

## 2020-10-08 NOTE — Patient Instructions (Signed)
[facility]  Discharge Instructions: Thank you for choosing [facility] to provide your oncology and hematology care.   If you have a lab appointment with the [facility], please go directly to the [facility] and check in at the registration area.   Wear comfortable clothing and clothing appropriate for easy access to any Portacath or PICC line.   We strive to give you quality time with your provider. You may need to reschedule your appointment if you arrive late (15 or more minutes).  Arriving late affects you and other patients whose appointments are after yours.  Also, if you miss three or more appointments without notifying the office, you may be dismissed from the clinic at the provider's discretion.      For prescription refill requests, have your pharmacy contact our office and allow 72 hours for refills to be completed.    Today you received the following chemotherapy and/or immunotherapy agents oxaliplatin    To help prevent nausea and vomiting after your treatment, we encourage you to take your nausea medication as directed.  BELOW ARE SYMPTOMS THAT SHOULD BE REPORTED IMMEDIATELY: . *FEVER GREATER THAN 100.4 F (38 C) OR HIGHER . *CHILLS OR SWEATING . *NAUSEA AND VOMITING THAT IS NOT CONTROLLED WITH YOUR NAUSEA MEDICATION . *UNUSUAL SHORTNESS OF BREATH . *UNUSUAL BRUISING OR BLEEDING . *URINARY PROBLEMS (pain or burning when urinating, or frequent urination) . *BOWEL PROBLEMS (unusual diarrhea, constipation, pain near the anus) . TENDERNESS IN MOUTH AND THROAT WITH OR WITHOUT PRESENCE OF ULCERS (sore throat, sores in mouth, or a toothache) . UNUSUAL RASH, SWELLING OR PAIN  . UNUSUAL VAGINAL DISCHARGE OR ITCHING   Items with * indicate a potential emergency and should be followed up as soon as possible or go to the Emergency Department if any problems should occur.  Please show the CHEMOTHERAPY ALERT CARD or IMMUNOTHERAPY ALERT  CARD at check-in to the Emergency Department and triage nurse.  Should you have questions after your visit or need to cancel or reschedule your appointment, please contact [facility]  Dept: [phone]  and follow the prompts.  Office hours are 8:00 a.m. to 4:30 p.m. Monday - Friday. Please note that voicemails left after 4:00 p.m. may not be returned until the following business day.  We are closed weekends and major holidays. You have access to a nurse at all times for urgent questions. Please call the main number to the clinic Dept: [phone] and follow the prompts.   For any non-urgent questions, you may also contact your provider using MyChart. We now offer e-Visits for anyone 26 and older to request care online for non-urgent symptoms. For details visit [email or web address].   Also download the MyChart app! Go to the app store, search "MyChart", open the app, select [facility], and log in with your MyChart username and password.  Due to Covid, a mask is required upon entering the hospital/clinic. If you do not have a mask, one will be given to you upon arrival. For doctor visits, patients may have 1 support person aged 31 or older with them. For treatment visits, patients cannot have anyone with them due to current Covid guidelines and our immunocompromised population.   Oxaliplatin Injection What is this medicine? OXALIPLATIN (ox AL i PLA tin) is a chemotherapy drug. It targets fast dividing cells, like cancer cells, and causes these cells to die. This medicine is used to treat cancers of the colon and  rectum, and many other cancers. This medicine may be used for other purposes; ask your health care provider or pharmacist if you have questions. COMMON BRAND NAME(S): Eloxatin What should I tell my health care provider before I take this medicine? They need to know if you have any of these conditions:  heart disease  history of irregular  heartbeat  liver disease  low blood counts, like white cells, platelets, or red blood cells  lung or breathing disease, like asthma  take medicines that treat or prevent blood clots  tingling of the fingers or toes, or other nerve disorder  an unusual or allergic reaction to oxaliplatin, other chemotherapy, other medicines, foods, dyes, or preservatives  pregnant or trying to get pregnant  breast-feeding How should I use this medicine? This drug is given as an infusion into a vein. It is administered in a hospital or clinic by a specially trained health care professional. Talk to your pediatrician regarding the use of this medicine in children. Special care may be needed. Overdosage: If you think you have taken too much of this medicine contact a poison control center or emergency room at once. NOTE: This medicine is only for you. Do not share this medicine with others. What if I miss a dose? It is important not to miss a dose. Call your doctor or health care professional if you are unable to keep an appointment. What may interact with this medicine? Do not take this medicine with any of the following medications:  cisapride  dronedarone  pimozide  thioridazine This medicine may also interact with the following medications:  aspirin and aspirin-like medicines  certain medicines that treat or prevent blood clots like warfarin, apixaban, dabigatran, and rivaroxaban  cisplatin  cyclosporine  diuretics  medicines for infection like acyclovir, adefovir, amphotericin B, bacitracin, cidofovir, foscarnet, ganciclovir, gentamicin, pentamidine, vancomycin  NSAIDs, medicines for pain and inflammation, like ibuprofen or naproxen  other medicines that prolong the QT interval (an abnormal heart rhythm)  pamidronate  zoledronic acid This list may not describe all possible interactions. Give your health care provider a list of all the medicines, herbs, non-prescription drugs,  or dietary supplements you use. Also tell them if you smoke, drink alcohol, or use illegal drugs. Some items may interact with your medicine. What should I watch for while using this medicine? Your condition will be monitored carefully while you are receiving this medicine. You may need blood work done while you are taking this medicine. This medicine may make you feel generally unwell. This is not uncommon as chemotherapy can affect healthy cells as well as cancer cells. Report any side effects. Continue your course of treatment even though you feel ill unless your healthcare professional tells you to stop. This medicine can make you more sensitive to cold. Do not drink cold drinks or use ice. Cover exposed skin before coming in contact with cold temperatures or cold objects. When out in cold weather wear warm clothing and cover your mouth and nose to warm the air that goes into your lungs. Tell your doctor if you get sensitive to the cold. Do not become pregnant while taking this medicine or for 9 months after stopping it. Women should inform their health care professional if they wish to become pregnant or think they might be pregnant. Men should not father a child while taking this medicine and for 6 months after stopping it. There is potential for serious side effects to an unborn child. Talk to your health care  professional for more information. Do not breast-feed a child while taking this medicine or for 3 months after stopping it. This medicine has caused ovarian failure in some women. This medicine may make it more difficult to get pregnant. Talk to your health care professional if you are concerned about your fertility. This medicine has caused decreased sperm counts in some men. This may make it more difficult to father a child. Talk to your health care professional if you are concerned about your fertility. This medicine may increase your risk of getting an infection. Call your health care  professional for advice if you get a fever, chills, or sore throat, or other symptoms of a cold or flu. Do not treat yourself. Try to avoid being around people who are sick. Avoid taking medicines that contain aspirin, acetaminophen, ibuprofen, naproxen, or ketoprofen unless instructed by your health care professional. These medicines may hide a fever. Be careful brushing or flossing your teeth or using a toothpick because you may get an infection or bleed more easily. If you have any dental work done, tell your dentist you are receiving this medicine. What side effects may I notice from receiving this medicine? Side effects that you should report to your doctor or health care professional as soon as possible:  allergic reactions like skin rash, itching or hives, swelling of the face, lips, or tongue  breathing problems  cough  low blood counts - this medicine may decrease the number of white blood cells, red blood cells, and platelets. You may be at increased risk for infections and bleeding  nausea, vomiting  pain, redness, or irritation at site where injected  pain, tingling, numbness in the hands or feet  signs and symptoms of bleeding such as bloody or black, tarry stools; red or dark brown urine; spitting up blood or brown material that looks like coffee grounds; red spots on the skin; unusual bruising or bleeding from the eyes, gums, or nose  signs and symptoms of a dangerous change in heartbeat or heart rhythm like chest pain; dizziness; fast, irregular heartbeat; palpitations; feeling faint or lightheaded; falls  signs and symptoms of infection like fever; chills; cough; sore throat; pain or trouble passing urine  signs and symptoms of liver injury like dark yellow or brown urine; general ill feeling or flu-like symptoms; light-colored stools; loss of appetite; nausea; right upper belly pain; unusually weak or tired; yellowing of the eyes or skin  signs and symptoms of low red  blood cells or anemia such as unusually weak or tired; feeling faint or lightheaded; falls  signs and symptoms of muscle injury like dark urine; trouble passing urine or change in the amount of urine; unusually weak or tired; muscle pain; back pain Side effects that usually do not require medical attention (report to your doctor or health care professional if they continue or are bothersome):  changes in taste  diarrhea  gas  hair loss  loss of appetite  mouth sores This list may not describe all possible side effects. Call your doctor for medical advice about side effects. You may report side effects to FDA at 1-800-FDA-1088. Where should I keep my medicine? This drug is given in a hospital or clinic and will not be stored at home. NOTE: This sheet is a summary. It may not cover all possible information. If you have questions about this medicine, talk to your doctor, pharmacist, or health care provider.  2021 Elsevier/Gold Standard (2018-09-13 12:20:35)

## 2020-10-08 NOTE — Patient Instructions (Signed)
[facility]  Discharge Instructions: Thank you for choosing [facility] to provide your oncology and hematology care.   If you have a lab appointment with the [facility], please go directly to the [facility] and check in at the registration area.   Wear comfortable clothing and clothing appropriate for easy access to any Portacath or PICC line.   We strive to give you quality time with your provider. You may need to reschedule your appointment if you arrive late (15 or more minutes).  Arriving late affects you and other patients whose appointments are after yours.  Also, if you miss three or more appointments without notifying the office, you may be dismissed from the clinic at the provider's discretion.      For prescription refill requests, have your pharmacy contact our office and allow 72 hours for refills to be completed.    Today you received the following chemotherapy and/or immunotherapy agents oxaliplatin      To help prevent nausea and vomiting after your treatment, we encourage you to take your nausea medication as directed.  BELOW ARE SYMPTOMS THAT SHOULD BE REPORTED IMMEDIATELY: . *FEVER GREATER THAN 100.4 F (38 C) OR HIGHER . *CHILLS OR SWEATING . *NAUSEA AND VOMITING THAT IS NOT CONTROLLED WITH YOUR NAUSEA MEDICATION . *UNUSUAL SHORTNESS OF BREATH . *UNUSUAL BRUISING OR BLEEDING . *URINARY PROBLEMS (pain or burning when urinating, or frequent urination) . *BOWEL PROBLEMS (unusual diarrhea, constipation, pain near the anus) . TENDERNESS IN MOUTH AND THROAT WITH OR WITHOUT PRESENCE OF ULCERS (sore throat, sores in mouth, or a toothache) . UNUSUAL RASH, SWELLING OR PAIN  . UNUSUAL VAGINAL DISCHARGE OR ITCHING   Items with * indicate a potential emergency and should be followed up as soon as possible or go to the Emergency Department if any problems should occur.  Please show the CHEMOTHERAPY ALERT CARD or IMMUNOTHERAPY ALERT  CARD at check-in to the Emergency Department and triage nurse.  Should you have questions after your visit or need to cancel or reschedule your appointment, please contact [facility]  Dept: [phone]  and follow the prompts.  Office hours are 8:00 a.m. to 4:30 p.m. Monday - Friday. Please note that voicemails left after 4:00 p.m. may not be returned until the following business day.  We are closed weekends and major holidays. You have access to a nurse at all times for urgent questions. Please call the main number to the clinic Dept: [phone] and follow the prompts.   For any non-urgent questions, you may also contact your provider using MyChart. We now offer e-Visits for anyone 68 and older to request care online for non-urgent symptoms. For details visit [email or web address].   Also download the MyChart app! Go to the app store, search "MyChart", open the app, select [facility], and log in with your MyChart username and password.  Due to Covid, a mask is required upon entering the hospital/clinic. If you do not have a mask, one will be given to you upon arrival. For doctor visits, patients may have 1 support person aged 55 or older with them. For treatment visits, patients cannot have anyone with them due to current Covid guidelines and our immunocompromised population.    Oxaliplatin Injection What is this medicine? OXALIPLATIN (ox AL i PLA tin) is a chemotherapy drug. It targets fast dividing cells, like cancer cells, and causes these cells to die. This medicine is used to treat cancers of  the colon and rectum, and many other cancers. This medicine may be used for other purposes; ask your health care provider or pharmacist if you have questions. COMMON BRAND NAME(S): Eloxatin What should I tell my health care provider before I take this medicine? They need to know if you have any of these conditions:  heart disease  history of irregular  heartbeat  liver disease  low blood counts, like white cells, platelets, or red blood cells  lung or breathing disease, like asthma  take medicines that treat or prevent blood clots  tingling of the fingers or toes, or other nerve disorder  an unusual or allergic reaction to oxaliplatin, other chemotherapy, other medicines, foods, dyes, or preservatives  pregnant or trying to get pregnant  breast-feeding How should I use this medicine? This drug is given as an infusion into a vein. It is administered in a hospital or clinic by a specially trained health care professional. Talk to your pediatrician regarding the use of this medicine in children. Special care may be needed. Overdosage: If you think you have taken too much of this medicine contact a poison control center or emergency room at once. NOTE: This medicine is only for you. Do not share this medicine with others. What if I miss a dose? It is important not to miss a dose. Call your doctor or health care professional if you are unable to keep an appointment. What may interact with this medicine? Do not take this medicine with any of the following medications:  cisapride  dronedarone  pimozide  thioridazine This medicine may also interact with the following medications:  aspirin and aspirin-like medicines  certain medicines that treat or prevent blood clots like warfarin, apixaban, dabigatran, and rivaroxaban  cisplatin  cyclosporine  diuretics  medicines for infection like acyclovir, adefovir, amphotericin B, bacitracin, cidofovir, foscarnet, ganciclovir, gentamicin, pentamidine, vancomycin  NSAIDs, medicines for pain and inflammation, like ibuprofen or naproxen  other medicines that prolong the QT interval (an abnormal heart rhythm)  pamidronate  zoledronic acid This list may not describe all possible interactions. Give your health care provider a list of all the medicines, herbs, non-prescription drugs,  or dietary supplements you use. Also tell them if you smoke, drink alcohol, or use illegal drugs. Some items may interact with your medicine. What should I watch for while using this medicine? Your condition will be monitored carefully while you are receiving this medicine. You may need blood work done while you are taking this medicine. This medicine may make you feel generally unwell. This is not uncommon as chemotherapy can affect healthy cells as well as cancer cells. Report any side effects. Continue your course of treatment even though you feel ill unless your healthcare professional tells you to stop. This medicine can make you more sensitive to cold. Do not drink cold drinks or use ice. Cover exposed skin before coming in contact with cold temperatures or cold objects. When out in cold weather wear warm clothing and cover your mouth and nose to warm the air that goes into your lungs. Tell your doctor if you get sensitive to the cold. Do not become pregnant while taking this medicine or for 9 months after stopping it. Women should inform their health care professional if they wish to become pregnant or think they might be pregnant. Men should not father a child while taking this medicine and for 6 months after stopping it. There is potential for serious side effects to an unborn child. Talk to  your health care professional for more information. Do not breast-feed a child while taking this medicine or for 3 months after stopping it. This medicine has caused ovarian failure in some women. This medicine may make it more difficult to get pregnant. Talk to your health care professional if you are concerned about your fertility. This medicine has caused decreased sperm counts in some men. This may make it more difficult to father a child. Talk to your health care professional if you are concerned about your fertility. This medicine may increase your risk of getting an infection. Call your health care  professional for advice if you get a fever, chills, or sore throat, or other symptoms of a cold or flu. Do not treat yourself. Try to avoid being around people who are sick. Avoid taking medicines that contain aspirin, acetaminophen, ibuprofen, naproxen, or ketoprofen unless instructed by your health care professional. These medicines may hide a fever. Be careful brushing or flossing your teeth or using a toothpick because you may get an infection or bleed more easily. If you have any dental work done, tell your dentist you are receiving this medicine. What side effects may I notice from receiving this medicine? Side effects that you should report to your doctor or health care professional as soon as possible:  allergic reactions like skin rash, itching or hives, swelling of the face, lips, or tongue  breathing problems  cough  low blood counts - this medicine may decrease the number of white blood cells, red blood cells, and platelets. You may be at increased risk for infections and bleeding  nausea, vomiting  pain, redness, or irritation at site where injected  pain, tingling, numbness in the hands or feet  signs and symptoms of bleeding such as bloody or black, tarry stools; red or dark brown urine; spitting up blood or brown material that looks like coffee grounds; red spots on the skin; unusual bruising or bleeding from the eyes, gums, or nose  signs and symptoms of a dangerous change in heartbeat or heart rhythm like chest pain; dizziness; fast, irregular heartbeat; palpitations; feeling faint or lightheaded; falls  signs and symptoms of infection like fever; chills; cough; sore throat; pain or trouble passing urine  signs and symptoms of liver injury like dark yellow or brown urine; general ill feeling or flu-like symptoms; light-colored stools; loss of appetite; nausea; right upper belly pain; unusually weak or tired; yellowing of the eyes or skin  signs and symptoms of low red  blood cells or anemia such as unusually weak or tired; feeling faint or lightheaded; falls  signs and symptoms of muscle injury like dark urine; trouble passing urine or change in the amount of urine; unusually weak or tired; muscle pain; back pain Side effects that usually do not require medical attention (report to your doctor or health care professional if they continue or are bothersome):  changes in taste  diarrhea  gas  hair loss  loss of appetite  mouth sores This list may not describe all possible side effects. Call your doctor for medical advice about side effects. You may report side effects to FDA at 1-800-FDA-1088. Where should I keep my medicine? This drug is given in a hospital or clinic and will not be stored at home. NOTE: This sheet is a summary. It may not cover all possible information. If you have questions about this medicine, talk to your doctor, pharmacist, or health care provider.  2021 Elsevier/Gold Standard (2018-09-13 12:20:35)

## 2020-10-09 ENCOUNTER — Telehealth: Payer: Self-pay

## 2020-10-09 NOTE — Telephone Encounter (Signed)
Called patient for first time chemo follow-up phone call. Patient states he has been doing well and has not had any side effects.  Patient reminded to call office with any questions or concerns. Patient verbalized understanding.

## 2020-10-16 ENCOUNTER — Other Ambulatory Visit: Payer: Self-pay | Admitting: Oncology

## 2020-10-22 ENCOUNTER — Other Ambulatory Visit (HOSPITAL_COMMUNITY): Payer: Self-pay

## 2020-10-22 ENCOUNTER — Other Ambulatory Visit: Payer: Self-pay | Admitting: Oncology

## 2020-10-22 DIAGNOSIS — C184 Malignant neoplasm of transverse colon: Secondary | ICD-10-CM

## 2020-10-22 MED ORDER — CAPECITABINE 500 MG PO TABS
2000.0000 mg | ORAL_TABLET | Freq: Two times a day (BID) | ORAL | 0 refills | Status: DC
Start: 2020-10-29 — End: 2020-11-11
  Filled 2020-10-22: qty 112, 21d supply, fill #0

## 2020-10-23 ENCOUNTER — Other Ambulatory Visit (HOSPITAL_COMMUNITY): Payer: Self-pay

## 2020-10-24 ENCOUNTER — Inpatient Hospital Stay: Payer: Medicare HMO | Admitting: Dietician

## 2020-10-24 ENCOUNTER — Other Ambulatory Visit: Payer: Self-pay

## 2020-10-24 ENCOUNTER — Ambulatory Visit (HOSPITAL_COMMUNITY)
Admission: RE | Admit: 2020-10-24 | Discharge: 2020-10-24 | Disposition: A | Discharge status: Home / Self Care | Payer: Medicare HMO | Source: Ambulatory Visit | Attending: Oncology | Admitting: Oncology

## 2020-10-24 DIAGNOSIS — K9400 Colostomy complication, unspecified: Secondary | ICD-10-CM

## 2020-10-24 DIAGNOSIS — R634 Abnormal weight loss: Secondary | ICD-10-CM | POA: Insufficient documentation

## 2020-10-24 DIAGNOSIS — C184 Malignant neoplasm of transverse colon: Secondary | ICD-10-CM | POA: Diagnosis not present

## 2020-10-24 DIAGNOSIS — K9409 Other complications of colostomy: Secondary | ICD-10-CM | POA: Insufficient documentation

## 2020-10-24 NOTE — Progress Notes (Signed)
Nutrition Follow-up:  Patient with stage IIIb colon cancer. 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 Patient receiving adjuvant chemotherapy with CAPOX  Spoke with patient via telephone. He reports he "is progressing well" Patient denies nausea, vomiting, cold sensitivity, having only room temp food/drinks. He reports stool output has more "substance" and had recently returned home from ostomy education. Patient reports he was given different type of bag with waist strap as well as clip which provides comfort that bag will not leak. Patient reports he is eating well, has not had fried foods and has increased fiber intake. He is eating lean meats, salads, broccoli, beans, oatmeal. Yesterday he recalls eating a [employer] ([place], lettuce, tomato, light mayo) split over the course of the day. He is eating 6-8 smaller meals with largest meal in the middle of the day. Patient reports he is staying hydrated. He drinks 4 (16oz) bottles of water, 2 Gatorade Zero, and cranberry juice daily.  Medications: Xeloda, Zofran, Compazine, Prilosec, Gabapentin, Metformin  Labs: Glucose 107, Cr 1.34  Anthropometrics: Weight 271 lb 9.6 oz on 6/1 decreased from 278 lb on 5/17 - 2.5% insignificant for timeframe   NUTRITION DIAGNOSIS: Food and nutrition related knowledge deficit improved   INTERVENTION:  Continue eating 6-8 small meals daily  Reviewed foods with protein to include with meals and snacks Discussed strategies for fatigue, will mail handout Patient has contact information     MONITORING, EVALUATION, GOAL: weight trends, intake   NEXT VISIT: Friday July 15 via telephone, pt aware

## 2020-10-24 NOTE — Discharge Instructions (Signed)
Today, I have placed you in a Hollister 2 3/4" 2 piece pouch system.  The item numbers are as follows:  Hollister pouch ITEM # [id number] Filtered pouch Hollister barrier ITEM# [id number] Hollister barrier ring ITEM # [id number] (Ceraplus slim ring) (Heals skin) Hollister ostomy belt (MEDIUM) Item # [id number]  Skin looks great  Good job!  The belt will give added security I have placed clamp at velcro closure for security Change pouch twice weekly and as needed if leaks REmove belt prior to showering. REuse belt each time. May want to order an extra in case stool gets on belt.   [facility] Nurse to call clinic. [phone])  What supply company has patient been set up with? IF not set up, you can call [employer] with the ITEM numbers above and get him set up.  [phone]  EXT [address])

## 2020-10-24 NOTE — Progress Notes (Signed)
[facility]   Reason for visit:  Colostomy leaking.  Significant weight loss HPI:  Transverse colon cancer, [facility]mann resection/colostomy 09/10/20.  Significant weight loss, patient experiencing increased output and frequent pouch leaks. Rounded obese abdomen.   ROS  Review of Systems  Gastrointestinal:        RUQ transverse colostomy  Musculoskeletal:  Positive for myalgias.       Weakness noted.   Skin:  Positive for color change.  All other systems reviewed and are negative. Vital signs:  BP 126/73   Pulse (!) 117   Temp (!) 97.5 F (36.4 C) (Oral)   Resp 20   SpO2 100%  Exam:  Physical Exam Vitals reviewed.  Constitutional:      Appearance: He is obese.  Abdominal:       Comments: Rounded abdomen. Impacts pouching and wear time.  Weight has been declining.  BMI 42.3  Skin:    General: Skin is warm and dry.       Neurological:     Mental Status: He is alert.  Psychiatric:        Mood and Affect: Mood normal.    Stoma type/location:  RUQ transverse colostomy Stomal assessment/size:  1 3/4" pink patent and producing soft brown stool. Peristomal assessment:  skin is intact  states last week after some leakage, it was burning and tender.  Today, this has resolved.  Treatment options for stomal/peristomal skin: Add barrier ring and ostomy belt for added security.  Output: soft brown stool.  Ostomy pouching: 2pc. 2 3/4" pouch with barrier ring and belt.   Education provided:  Explained rationale for belt and barrier ring. I am sending him home with no sting skin barrier if skin becomes irritated again he can use this. Today, we use a ceraplus barrier ring to promote skin health in the peristomal area. Stoma is 1 3/4"  Wife has been cutting this appropriately .  Patient helps as he can, but has experienced some cognitive changes since his health had declined.  POuch and barrier applied over the ring and patient is fitted with an ostomy belt (size large).  He is  pleased and feels that this system is much more secure.  His pouch applied today has a filter to allow for gas to pass through with no odor and no need to open/burp his bag and he is pleased I have sent home instructions in discharge paperwork for [facility] to call. I would like to ensure he has been set up with a supply company.  I have listed all ITEM numbers and the number to [employer] to call and get him set up if needed. I am sending him home with 3 pouch sets and skin prep today.    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  Plan  Call clinic in one week.     Visit time: 55 minutes.   Domenic Moras FNP-BC

## 2020-10-25 ENCOUNTER — Other Ambulatory Visit: Payer: Self-pay | Admitting: Oncology

## 2020-10-28 ENCOUNTER — Telehealth: Payer: Self-pay

## 2020-10-28 NOTE — Telephone Encounter (Signed)
V/M message  from Pt inquiring about when he should start his new prescription for xeloda. Left message informing Pt he should start taking medication tomorrow 10/29/20. And if he should have any problems or concerns to give a return call to the office.

## 2020-10-29 ENCOUNTER — Inpatient Hospital Stay: Payer: Medicare HMO

## 2020-10-29 ENCOUNTER — Encounter: Payer: Self-pay | Admitting: Nurse Practitioner

## 2020-10-29 ENCOUNTER — Inpatient Hospital Stay (HOSPITAL_BASED_OUTPATIENT_CLINIC_OR_DEPARTMENT_OTHER): Payer: Medicare HMO | Admitting: Nurse Practitioner

## 2020-10-29 ENCOUNTER — Other Ambulatory Visit: Payer: Self-pay

## 2020-10-29 VITALS — BP 129/85 | HR 100 | Temp 97.8°F | Resp 19 | Ht 68.0 in | Wt 258.4 lb

## 2020-10-29 DIAGNOSIS — Z5111 Encounter for antineoplastic chemotherapy: Secondary | ICD-10-CM | POA: Diagnosis not present

## 2020-10-29 DIAGNOSIS — C184 Malignant neoplasm of transverse colon: Secondary | ICD-10-CM

## 2020-10-29 LAB — CBC WITH DIFFERENTIAL (CANCER CENTER ONLY)
Abs Immature Granulocytes: 0.03 10*3/uL (ref 0.00–0.07)
Basophils Absolute: 0.1 10*3/uL (ref 0.0–0.1)
Basophils Relative: 1 %
Eosinophils Absolute: 0.2 10*3/uL (ref 0.0–0.5)
Eosinophils Relative: 3 %
HCT: 31.3 % — ABNORMAL LOW (ref 39.0–52.0)
Hemoglobin: 10.8 g/dL — ABNORMAL LOW (ref 13.0–17.0)
Immature Granulocytes: 0 %
Lymphocytes Relative: 21 %
Lymphs Abs: 1.6 10*3/uL (ref 0.7–4.0)
MCH: 25.2 pg — ABNORMAL LOW (ref 26.0–34.0)
MCHC: 34.5 g/dL (ref 30.0–36.0)
MCV: 73.1 fL — ABNORMAL LOW (ref 80.0–100.0)
Monocytes Absolute: 1.1 10*3/uL — ABNORMAL HIGH (ref 0.1–1.0)
Monocytes Relative: 15 %
Neutro Abs: 4.6 10*3/uL (ref 1.7–7.7)
Neutrophils Relative %: 60 %
Platelet Count: 279 10*3/uL (ref 150–400)
RBC: 4.28 MIL/uL (ref 4.22–5.81)
RDW: 17.2 % — ABNORMAL HIGH (ref 11.5–15.5)
WBC Count: 7.5 10*3/uL (ref 4.0–10.5)
nRBC: 0 % (ref 0.0–0.2)

## 2020-10-29 LAB — CMP (CANCER CENTER ONLY)
ALT: 21 U/L (ref 0–44)
AST: 12 U/L — ABNORMAL LOW (ref 15–41)
Albumin: 4.3 g/dL (ref 3.5–5.0)
Alkaline Phosphatase: 100 U/L (ref 38–126)
Anion gap: 13 (ref 5–15)
BUN: 35 mg/dL — ABNORMAL HIGH (ref 8–23)
CO2: 18 mmol/L — ABNORMAL LOW (ref 22–32)
Calcium: 9.7 mg/dL (ref 8.9–10.3)
Chloride: 106 mmol/L (ref 98–111)
Creatinine: 1.56 mg/dL — ABNORMAL HIGH (ref 0.61–1.24)
GFR, Estimated: 50 mL/min — ABNORMAL LOW (ref >60)
Glucose, Bld: 196 mg/dL — ABNORMAL HIGH (ref 70–99)
Potassium: 3.8 mmol/L (ref 3.5–5.1)
Sodium: 137 mmol/L (ref 135–145)
Total Bilirubin: 0.5 mg/dL (ref 0.3–1.2)
Total Protein: 7.6 g/dL (ref 6.5–8.1)

## 2020-10-29 MED ORDER — PALONOSETRON HCL INJECTION 0.25 MG/5ML
0.2500 mg | Freq: Once | INTRAVENOUS | Status: AC
Start: 2020-10-29 — End: 2020-10-29
  Administered 2020-10-29: 0.25 mg via INTRAVENOUS
  Filled 2020-10-29: qty 5

## 2020-10-29 MED ORDER — SODIUM CHLORIDE 0.9% FLUSH
10.0000 mL | INTRAVENOUS | Status: DC | PRN
Start: 2020-10-29 — End: 2020-10-29
  Administered 2020-10-29: 10 mL
  Filled 2020-10-29: qty 10

## 2020-10-29 MED ORDER — OXALIPLATIN CHEMO INJECTION 100 MG/20ML
100.0000 mg/m2 | Freq: Once | INTRAVENOUS | Status: AC
  Administered 2020-10-29: 245 mg via INTRAVENOUS
  Filled 2020-10-29: qty 40

## 2020-10-29 MED ORDER — HEPARIN SOD (PORK) LOCK FLUSH 100 UNIT/ML IV SOLN
500.0000 [IU] | Freq: Once | INTRAVENOUS | Status: AC | PRN
  Administered 2020-10-29: 500 [IU]
  Filled 2020-10-29: qty 5

## 2020-10-29 MED ORDER — DEXAMETHASONE SODIUM PHOSPHATE 100 MG/10ML IJ SOLN
10.0000 mg | Freq: Once | INTRAMUSCULAR | Status: AC
  Administered 2020-10-29: 10 mg via INTRAVENOUS
  Filled 2020-10-29: qty 1

## 2020-10-29 MED ORDER — DEXTROSE 5 % IV SOLN
Freq: Once | INTRAVENOUS | Status: AC
  Filled 2020-10-29: qty 250

## 2020-10-29 NOTE — Progress Notes (Signed)
Per Leander Rams, NP ok to treat with Creatinine of 1.56

## 2020-10-29 NOTE — Patient Instructions (Signed)
[facility]  Discharge Instructions: Thank you for choosing [facility] to provide your oncology and hematology care.   If you have a lab appointment with the Cancer Center, please go directly to the Cancer Center and check in at the registration area.   Wear comfortable clothing and clothing appropriate for easy access to any Portacath or PICC line.   We strive to give you quality time with your provider. You may need to reschedule your appointment if you arrive late (15 or more minutes).  Arriving late affects you and other patients whose appointments are after yours.  Also, if you miss three or more appointments without notifying the office, you may be dismissed from the clinic at the provider's discretion.      For prescription refill requests, have your pharmacy contact our office and allow 72 hours for refills to be completed.    Today you received the following chemotherapy and/or immunotherapy agents oxaliplatin      To help prevent nausea and vomiting after your treatment, we encourage you to take your nausea medication as directed.  BELOW ARE SYMPTOMS THAT SHOULD BE REPORTED IMMEDIATELY: *FEVER GREATER THAN 100.4 F (38 C) OR HIGHER *CHILLS OR SWEATING *NAUSEA AND VOMITING THAT IS NOT CONTROLLED WITH YOUR NAUSEA MEDICATION *UNUSUAL SHORTNESS OF BREATH *UNUSUAL BRUISING OR BLEEDING *URINARY PROBLEMS (pain or burning when urinating, or frequent urination) *BOWEL PROBLEMS (unusual diarrhea, constipation, pain near the anus) TENDERNESS IN MOUTH AND THROAT WITH OR WITHOUT PRESENCE OF ULCERS (sore throat, sores in mouth, or a toothache) UNUSUAL RASH, SWELLING OR PAIN  UNUSUAL VAGINAL DISCHARGE OR ITCHING   Items with * indicate a potential emergency and should be followed up as soon as possible or go to the Emergency Department if any problems should occur.  Please show the CHEMOTHERAPY ALERT CARD or IMMUNOTHERAPY ALERT CARD at check-in to  the Emergency Department and triage nurse.  Should you have questions after your visit or need to cancel or reschedule your appointment, please contact [facility]  Dept: [phone]  and follow the prompts.  Office hours are 8:00 a.m. to 4:30 p.m. Monday - Friday. Please note that voicemails left after 4:00 p.m. may not be returned until the following business day.  We are closed weekends and major holidays. You have access to a nurse at all times for urgent questions. Please call the main number to the clinic Dept: [phone] and follow the prompts.   For any non-urgent questions, you may also contact your provider using MyChart. We now offer e-Visits for anyone 18 and older to request care online for non-urgent symptoms. For details visit [email or web address].   Also download the MyChart app! Go to the app store, search "MyChart", open the app, select [facility], and log in with your MyChart username and password.  Due to Covid, a mask is required upon entering the hospital/clinic. If you do not have a mask, one will be given to you upon arrival. For doctor visits, patients may have 1 support person aged 18 or older with them. For treatment visits, patients cannot have anyone with them due to current Covid guidelines and our immunocompromised population.   Oxaliplatin Injection What is this medication? OXALIPLATIN (ox AL i PLA tin) is a chemotherapy drug. It targets fast dividing cells, like cancer cells, and causes these cells to die. This medicine is used to treat cancers of the colon and rectum, and many other cancers. This medicine may   be used for other purposes; ask your health care provider or pharmacist if you have questions. COMMON BRAND NAME(S): Eloxatin What should I tell my care team before I take this medication? They need to know if you have any of these conditions: heart disease history of irregular heartbeat liver disease low blood counts, like  white cells, platelets, or red blood cells lung or breathing disease, like asthma take medicines that treat or prevent blood clots tingling of the fingers or toes, or other nerve disorder an unusual or allergic reaction to oxaliplatin, other chemotherapy, other medicines, foods, dyes, or preservatives pregnant or trying to get pregnant breast-feeding How should I use this medication? This drug is given as an infusion into a vein. It is administered in a hospital or clinic by a specially trained health care professional. Talk to your pediatrician regarding the use of this medicine in children. Special care may be needed. Overdosage: If you think you have taken too much of this medicine contact a poison control center or emergency room at once. NOTE: This medicine is only for you. Do not share this medicine with others. What if I miss a dose? It is important not to miss a dose. Call your doctor or health care professional if you are unable to keep an appointment. What may interact with this medication? Do not take this medicine with any of the following medications: cisapride dronedarone pimozide thioridazine This medicine may also interact with the following medications: aspirin and aspirin-like medicines certain medicines that treat or prevent blood clots like warfarin, apixaban, dabigatran, and rivaroxaban cisplatin cyclosporine diuretics medicines for infection like acyclovir, adefovir, amphotericin B, bacitracin, cidofovir, foscarnet, ganciclovir, gentamicin, pentamidine, vancomycin NSAIDs, medicines for pain and inflammation, like ibuprofen or naproxen other medicines that prolong the QT interval (an abnormal heart rhythm) pamidronate zoledronic acid This list may not describe all possible interactions. Give your health care provider a list of all the medicines, herbs, non-prescription drugs, or dietary supplements you use. Also tell them if you smoke, drink alcohol, or use illegal  drugs. Some items may interact with your medicine. What should I watch for while using this medication? Your condition will be monitored carefully while you are receiving this medicine. You may need blood work done while you are taking this medicine. This medicine may make you feel generally unwell. This is not uncommon as chemotherapy can affect healthy cells as well as cancer cells. Report any side effects. Continue your course of treatment even though you feel ill unless your healthcare professional tells you to stop. This medicine can make you more sensitive to cold. Do not drink cold drinks or use ice. Cover exposed skin before coming in contact with cold temperatures or cold objects. When out in cold weather wear warm clothing and cover your mouth and nose to warm the air that goes into your lungs. Tell your doctor if you get sensitive to the cold. Do not become pregnant while taking this medicine or for 9 months after stopping it. Women should inform their health care professional if they wish to become pregnant or think they might be pregnant. Men should not father a child while taking this medicine and for 6 months after stopping it. There is potential for serious side effects to an unborn child. Talk to your health care professional for more information. Do not breast-feed a child while taking this medicine or for 3 months after stopping it. This medicine has caused ovarian failure in some women. This medicine may make   it more difficult to get pregnant. Talk to your health care professional if you are concerned about your fertility. This medicine has caused decreased sperm counts in some men. This may make it more difficult to father a child. Talk to your health care professional if you are concerned about your fertility. This medicine may increase your risk of getting an infection. Call your health care professional for advice if you get a fever, chills, or sore throat, or other symptoms of a  cold or flu. Do not treat yourself. Try to avoid being around people who are sick. Avoid taking medicines that contain aspirin, acetaminophen, ibuprofen, naproxen, or ketoprofen unless instructed by your health care professional. These medicines may hide a fever. Be careful brushing or flossing your teeth or using a toothpick because you may get an infection or bleed more easily. If you have any dental work done, tell your dentist you are receiving this medicine. What side effects may I notice from receiving this medication? Side effects that you should report to your doctor or health care professional as soon as possible: allergic reactions like skin rash, itching or hives, swelling of the face, lips, or tongue breathing problems cough low blood counts - this medicine may decrease the number of white blood cells, red blood cells, and platelets. You may be at increased risk for infections and bleeding nausea, vomiting pain, redness, or irritation at site where injected pain, tingling, numbness in the hands or feet signs and symptoms of bleeding such as bloody or black, tarry stools; red or dark brown urine; spitting up blood or brown material that looks like coffee grounds; red spots on the skin; unusual bruising or bleeding from the eyes, gums, or nose signs and symptoms of a dangerous change in heartbeat or heart rhythm like chest pain; dizziness; fast, irregular heartbeat; palpitations; feeling faint or lightheaded; falls signs and symptoms of infection like fever; chills; cough; sore throat; pain or trouble passing urine signs and symptoms of liver injury like dark yellow or brown urine; general ill feeling or flu-like symptoms; light-colored stools; loss of appetite; nausea; right upper belly pain; unusually weak or tired; yellowing of the eyes or skin signs and symptoms of low red blood cells or anemia such as unusually weak or tired; feeling faint or lightheaded; falls signs and symptoms of  muscle injury like dark urine; trouble passing urine or change in the amount of urine; unusually weak or tired; muscle pain; back pain Side effects that usually do not require medical attention (report to your doctor or health care professional if they continue or are bothersome): changes in taste diarrhea gas hair loss loss of appetite mouth sores This list may not describe all possible side effects. Call your doctor for medical advice about side effects. You may report side effects to FDA at 1-800-FDA-1088. Where should I keep my medication? This drug is given in a hospital or clinic and will not be stored at home. NOTE: This sheet is a summary. It may not cover all possible information. If you have questions about this medicine, talk to your doctor, pharmacist, or health care provider.  2022 Elsevier/Gold Standard (2018-09-13 12:20:35)  

## 2020-10-29 NOTE — Progress Notes (Signed)
  [facility] OFFICE PROGRESS NOTE   Diagnosis: Colon cancer  INTERVAL HISTORY:   Mr. Noda returns as scheduled.  He completed cycle 1 CAPOX beginning 10/08/2020.  He denies nausea/vomiting.  No mouth sores.  No diarrhea.  Cold sensitivity lasted about a week.  No persistent neuropathy symptoms.  Some fatigue.  He reports a good appetite but continues to note weight loss.  Objective:  Vital signs in last 24 hours:  Blood pressure 129/85, pulse 100, temperature 97.8 F (36.6 C), temperature source Oral, resp. rate 19, height _0  (1.727 m), weight 258 lb 6.4 oz (117.2 kg), SpO2 100 %.    HEENT: No thrush or ulcers. Resp: Lungs clear bilaterally. Cardio: Regular rate and rhythm. GI: Abdomen soft and nontender.  No hepatomegaly.  Healed surgical incisions.  Right abdomen colostomy with thick stool in the collection bag. Vascular: No leg edema. Skin: Palms with mild hyperpigmentation. Port-A-Cath without erythema.  Medial edge of the Port-A-Cath incision has a small superficial opening.  Steri-Strip in place.   Lab Results:  Lab Results  Component Value Date   WBC 7.5 10/29/2020   HGB 10.8 (L) 10/29/2020   HCT 31.3 (L) 10/29/2020   MCV 73.1 (L) 10/29/2020   PLT 279 10/29/2020   NEUTROABS 4.6 10/29/2020    Imaging:  No results found.  Medications: I have reviewed the patient's current medications.  Assessment/Plan: Transverse colon cancer, stage IIIb (T3N1a), status post a partial transverse colectomy and end colostomy 09/10/2020 CT abdomen/pelvis 09/04/2020- possible transverse colon mass with small regional lymph nodes Colonoscopy 09/05/2020- obstructing transverse colon mass-biopsy invasive adenocarcinoma, mass could not be passed CT chest 09/09/2020-no evidence of metastatic disease, 3 x 2 mm subpleural nodule in the right upper lobe likely benign subpleural lymph node Partial transverse colectomy 09/10/2020, transverse colon tumor, no lymphovascular perineural  invasion, 1/19 lymph nodes positive, negative margins, MSI stable, no loss of mismatch repair protein expression Cycle 1 CAPOX 10/08/2020, oxaliplatin dose reduced to 100 mg per metered squared secondary to renal insufficiency Cycle 2 CAPOX 10/29/2020 Resection of a frontal meningioma 09/01/2016 Diabetes Rectal polyp- tubular adenoma on colonoscopy 09/05/2020 Hypertension Diabetic neuropathy Epilepsy Family history of breast and prostate cancer Admission with acute renal failure 10/01/2020-secondary to lack of oral intake and lisinopril, improved with intravenous hydration and holding lisinopril  Disposition: Mr. Mah appears stable.  He tolerated the first cycle of CAPOX well.  Plan to proceed with cycle 2 today as scheduled.  We discussed the overall treatment plan is to complete 4 cycles.  We reviewed the CBC from today.  Counts adequate to proceed with treatment.  He will return for lab, follow-up, cycle 3 CAPOX in 3 weeks.  He will contact the office in the interim with any problems.      Ned Card ANP/GNP-BC   10/29/2020  10:07 AM

## 2020-11-06 ENCOUNTER — Other Ambulatory Visit (HOSPITAL_COMMUNITY): Payer: Self-pay

## 2020-11-11 ENCOUNTER — Other Ambulatory Visit: Payer: Self-pay | Admitting: Oncology

## 2020-11-11 ENCOUNTER — Other Ambulatory Visit (HOSPITAL_COMMUNITY): Payer: Self-pay

## 2020-11-11 DIAGNOSIS — C184 Malignant neoplasm of transverse colon: Secondary | ICD-10-CM

## 2020-11-12 ENCOUNTER — Other Ambulatory Visit (HOSPITAL_COMMUNITY): Payer: Self-pay

## 2020-11-12 ENCOUNTER — Other Ambulatory Visit: Payer: Self-pay | Admitting: Oncology

## 2020-11-12 DIAGNOSIS — C184 Malignant neoplasm of transverse colon: Secondary | ICD-10-CM

## 2020-11-13 ENCOUNTER — Other Ambulatory Visit (HOSPITAL_COMMUNITY): Payer: Self-pay

## 2020-11-14 ENCOUNTER — Other Ambulatory Visit (HOSPITAL_COMMUNITY): Payer: Self-pay

## 2020-11-14 NOTE — Telephone Encounter (Signed)
Per DR Benay Spice hold refill until Pt's appointment.

## 2020-11-15 ENCOUNTER — Encounter (HOSPITAL_BASED_OUTPATIENT_CLINIC_OR_DEPARTMENT_OTHER): Payer: Self-pay

## 2020-11-15 ENCOUNTER — Emergency Department (HOSPITAL_BASED_OUTPATIENT_CLINIC_OR_DEPARTMENT_OTHER)
Admission: EM | Admit: 2020-11-15 | Discharge: 2020-11-15 | Disposition: A | Discharge status: Home / Self Care | Payer: Medicare HMO | Attending: Emergency Medicine | Admitting: Emergency Medicine

## 2020-11-15 ENCOUNTER — Other Ambulatory Visit: Payer: Self-pay

## 2020-11-15 ENCOUNTER — Emergency Department (HOSPITAL_BASED_OUTPATIENT_CLINIC_OR_DEPARTMENT_OTHER): Payer: Medicare HMO

## 2020-11-15 DIAGNOSIS — E1142 Type 2 diabetes mellitus with diabetic polyneuropathy: Secondary | ICD-10-CM | POA: Diagnosis not present

## 2020-11-15 DIAGNOSIS — R5383 Other fatigue: Secondary | ICD-10-CM

## 2020-11-15 DIAGNOSIS — R109 Unspecified abdominal pain: Secondary | ICD-10-CM | POA: Insufficient documentation

## 2020-11-15 DIAGNOSIS — Z87891 Personal history of nicotine dependence: Secondary | ICD-10-CM | POA: Insufficient documentation

## 2020-11-15 DIAGNOSIS — I10 Essential (primary) hypertension: Secondary | ICD-10-CM | POA: Diagnosis not present

## 2020-11-15 DIAGNOSIS — E860 Dehydration: Secondary | ICD-10-CM | POA: Diagnosis not present

## 2020-11-15 DIAGNOSIS — Z85038 Personal history of other malignant neoplasm of large intestine: Secondary | ICD-10-CM | POA: Insufficient documentation

## 2020-11-15 DIAGNOSIS — Z20822 Contact with and (suspected) exposure to covid-19: Secondary | ICD-10-CM | POA: Insufficient documentation

## 2020-11-15 DIAGNOSIS — Z79899 Other long term (current) drug therapy: Secondary | ICD-10-CM | POA: Diagnosis not present

## 2020-11-15 LAB — CBC WITH DIFFERENTIAL/PLATELET
Abs Immature Granulocytes: 0.04 10*3/uL (ref 0.00–0.07)
Basophils Absolute: 0.1 10*3/uL (ref 0.0–0.1)
Basophils Relative: 1 %
Eosinophils Absolute: 0.3 10*3/uL (ref 0.0–0.5)
Eosinophils Relative: 3 %
HCT: 30.5 % — ABNORMAL LOW (ref 39.0–52.0)
Hemoglobin: 10.8 g/dL — ABNORMAL LOW (ref 13.0–17.0)
Immature Granulocytes: 0 %
Lymphocytes Relative: 14 %
Lymphs Abs: 1.5 10*3/uL (ref 0.7–4.0)
MCH: 25.9 pg — ABNORMAL LOW (ref 26.0–34.0)
MCHC: 35.4 g/dL (ref 30.0–36.0)
MCV: 73.1 fL — ABNORMAL LOW (ref 80.0–100.0)
Monocytes Absolute: 0.9 10*3/uL (ref 0.1–1.0)
Monocytes Relative: 8 %
Neutro Abs: 7.8 10*3/uL — ABNORMAL HIGH (ref 1.7–7.7)
Neutrophils Relative %: 74 %
Platelets: 281 10*3/uL (ref 150–400)
RBC: 4.17 MIL/uL — ABNORMAL LOW (ref 4.22–5.81)
RDW: 17.6 % — ABNORMAL HIGH (ref 11.5–15.5)
WBC: 10.6 10*3/uL — ABNORMAL HIGH (ref 4.0–10.5)
nRBC: 0 % (ref 0.0–0.2)

## 2020-11-15 LAB — COMPREHENSIVE METABOLIC PANEL
ALT: 16 U/L (ref 0–44)
AST: 10 U/L — ABNORMAL LOW (ref 15–41)
Albumin: 4.1 g/dL (ref 3.5–5.0)
Alkaline Phosphatase: 74 U/L (ref 38–126)
Anion gap: 13 (ref 5–15)
BUN: 46 mg/dL — ABNORMAL HIGH (ref 8–23)
CO2: 18 mmol/L — ABNORMAL LOW (ref 22–32)
Calcium: 9.7 mg/dL (ref 8.9–10.3)
Chloride: 107 mmol/L (ref 98–111)
Creatinine, Ser: 1.5 mg/dL — ABNORMAL HIGH (ref 0.61–1.24)
GFR, Estimated: 52 mL/min — ABNORMAL LOW (ref >60)
Glucose, Bld: 203 mg/dL — ABNORMAL HIGH (ref 70–99)
Potassium: 4.2 mmol/L (ref 3.5–5.1)
Sodium: 138 mmol/L (ref 135–145)
Total Bilirubin: 0.7 mg/dL (ref 0.3–1.2)
Total Protein: 7 g/dL (ref 6.5–8.1)

## 2020-11-15 LAB — URINALYSIS, ROUTINE W REFLEX MICROSCOPIC
Bilirubin Urine: NEGATIVE
Glucose, UA: 100 mg/dL — AB
Ketones, ur: NEGATIVE mg/dL
Nitrite: NEGATIVE
Protein, ur: 100 mg/dL — AB
Specific Gravity, Urine: 1.03 (ref 1.005–1.030)
pH: 5.5 (ref 5.0–8.0)

## 2020-11-15 LAB — LIPASE, BLOOD: Lipase: 14 U/L (ref 11–51)

## 2020-11-15 LAB — RESP PANEL BY RT-PCR (FLU A&B, COVID) ARPGX2
Influenza A by PCR: NEGATIVE
Influenza B by PCR: NEGATIVE
SARS Coronavirus 2 by RT PCR: NEGATIVE

## 2020-11-15 MED ORDER — SODIUM CHLORIDE 0.9 % IV BOLUS
250.0000 mL | Freq: Once | INTRAVENOUS | Status: AC
  Administered 2020-11-15: 250 mL via INTRAVENOUS

## 2020-11-15 MED ORDER — SODIUM CHLORIDE 0.9 % IV BOLUS
1000.0000 mL | Freq: Once | INTRAVENOUS | Status: AC
  Administered 2020-11-15: 1000 mL via INTRAVENOUS

## 2020-11-15 MED ORDER — HEPARIN SOD (PORK) LOCK FLUSH 100 UNIT/ML IV SOLN
500.0000 [IU] | Freq: Once | INTRAVENOUS | Status: AC
  Administered 2020-11-15: 500 [IU]
  Filled 2020-11-15: qty 5

## 2020-11-15 NOTE — ED Triage Notes (Signed)
He tells me he has colon cancer with a colostomy. He states that, for about a week now, his colostomy has increased its output; and he feels like he is "maybe dehydrated". Ostomy is rlq area of abd. He is in no distress.

## 2020-11-15 NOTE — ED Provider Notes (Signed)
[facility] EMERGENCY DEPT Provider Note   CSN: [id number] Arrival date & time: 11/15/20  1211     History Chief Complaint  Patient presents with   Dehydration   Abdominal Pain    Thomas [name] is a 65 y.o. male.  HPI     65yo male with history of htn, hlpd, DM, CVA, frontal meningioma with resection 2018, transverse colon cancer stage IIIb status post partial transverese colectomy and end colostomy 09/10/2020 on chemotherapy 6/1 first, presents with severe fatigue and increased ostomy output.  Output from ostomy increased for 4-5 days, QID No nausea, no vomiting, fevers, cough, dysuria Slight abdominal pain, not changed, off and on related to bag Moderate appetite, but doesn't want to sit up due to lack of energy  Hoping for 4 cycles 1 week of severe fatigue [address] on Thursday  Past Medical History:  Diagnosis Date   Acute ischemic left MCA stroke ([place]) 04/09/2014   Acute respiratory failure with hypoxia ([place]) 04/09/2014   Aspiration pneumonia ([place])    Asthma    as a child   Brain tumor ([place])    frontal   Cancer ([place])    Confusion    occasionally   Diabetes mellitus without complication ([place])    takes Metformin daily.   Dizziness    Dyspnea    pt states d/t weight   Epilepsy ([place])    takes Keppra daily   GERD (gastroesophageal reflux disease)    takes Omeprazole daily   Headache    Hyperlipidemia    takes Atorvastatin daily   Hypertension    takes Lotrel daily   Peripheral edema    takes Lasix daily   Peripheral neuropathy    takes Gabapentin daily   Pneumonia 3 yrs ago   hx of   Seizures ([facility])    Sleep apnea    Urinary frequency     Patient Active Problem List   Diagnosis Date Noted   Acute renal failure (ARF) ([place]) 10/01/2020   Hyponatremia 10/01/2020   Iron deficiency anemia due to chronic blood loss 61/60/7371   Metabolic acidosis 11/03/9483   Colon cancer ([place]) 09/23/2020   Colostomy in place [facility]) 09/11/2020   History of  ischemic left MCA stroke 2015 09/11/2020   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    Rectal polyp    Diverticulosis of colon without hemorrhage    Abdominal pain 09/04/2020   Diabetic polyneuropathy associated with type 2 diabetes mellitus ([place]) 06/28/2018   Venous insufficiency of both lower extremities 06/28/2018   S/P craniotomy 09/01/2016   Meningioma ([place]) 09/01/2016   Trauma    Diabetes mellitus type II, controlled ([place])    Essential hypertension    Acute encephalopathy 04/09/2014   Lactic acidosis 04/09/2014   Hyperammonemia ([place]) 04/09/2014    Past Surgical History:  Procedure Laterality Date   BIOPSY  09/05/2020   Procedure: BIOPSY;  Surgeon: Irene Shipper, MD;  Location: [facility];  Service: Endoscopy;;   CARDIAC CATHETERIZATION  7 yrs ago   COLONOSCOPY WITH PROPOFOL N/A 09/05/2020   Procedure: COLONOSCOPY WITH PROPOFOL;  Surgeon: Irene Shipper, MD;  [facility] END[facility]COPY;  Service: Endoscopy;  Laterality: N/A;   CRANIOTOMY N/A 09/01/2016   Procedure: CRANIOTOMY TUMOR  LEFT PTERIONAL;  Surgeon: Ashok Pall, MD;  Location: [facility];  Service: Neurosurgery;  Laterality: N/A;   cyst removed from chest      as a child   NO PAST SURGERIES     PARTIAL  COLECTOMY N/A 09/10/2020   Procedure: TRANSVERSE COLECTOMY;  Surgeon: [name], MD;  Location: [facility];  Service: General;  Laterality: N/A;   POLYPECTOMY  09/05/2020   Procedure: POLYPECTOMY;  Surgeon: Irene Shipper, MD;  Location: [facility] ENDOSCOPY;  Service: Endoscopy;;   PORTACATH PLACEMENT Right 10/01/2020   Procedure: INSERTION PORT-A-CATH;  Surgeon: [name], MD;  Location: [facility];  Service: General;  Laterality: Right;   SUBMUCOSAL TATTOO INJECTION  09/05/2020   Procedure: SUBMUCOSAL TATTOO INJECTION;  Surgeon: Irene Shipper, MD;  Location: [facility] ENDOSCOPY;  Service: Endoscopy;;       Family History  Problem Relation Age of Onset   Breast cancer Mother    Breast cancer  Maternal Aunt    Prostate cancer Maternal Uncle     Social History   Tobacco Use   Smoking status: Former    Pack years: 0.00   Smokeless tobacco: Never   Tobacco comments:    quit smoking 35 yrs ago  Vaping Use   Vaping Use: Never used  Substance Use Topics   Alcohol use: No   Drug use: No    Home Medications Prior to Admission medications   Medication Sig Start Date End Date Taking? Authorizing Provider  acetaminophen (TYLENOL) 500 MG tablet Take 1,000 mg by mouth 3 (three) times daily as needed for headache (pain).    [provider]  albuterol (PROVENTIL HFA;VENTOLIN HFA) 108 (90 BASE) MCG/ACT inhaler Inhale 2 puffs into the lungs every 4 (four) hours as needed for wheezing or shortness of breath. Patient not taking: Reported on 10/08/2020 04/09/15   Muthersbaugh, Jarrett Soho, PA-C  amLODipine (NORVASC) 10 MG tablet Take 10 mg by mouth daily. 05/11/20   [provider]  atorvastatin (LIPITOR) 20 MG tablet Take 20 mg by mouth daily at 6 PM.  Patient not taking: Reported on 10/08/2020    [provider]  capecitabine (XELODA) 500 MG tablet Take 4 tablets (2,000 mg total) by mouth 2 (two) times daily after a meal. Take for 14 days, then hold for 7 days. Repeat every 21 days. 10/29/20   Ladell Pier, MD  gabapentin (NEURONTIN) 300 MG capsule Take 1 capsule (300 mg total) by mouth 3 (three) times daily. 04/06/16   Varney Biles, MD  levETIRAcetam (KEPPRA) 1000 MG tablet Take 1,000 mg by mouth 2 (two) times daily. 07/28/16   [provider]  lidocaine-prilocaine (EMLA) cream Apply 1 application topically as directed. Apply 1/2 tablespoon to port site 2 hours prior to stick and cover with plastic wrap to numb site Patient not taking: Reported on 10/08/2020 09/24/20   Ladell Pier, MD  metFORMIN (GLUCOPHAGE-XR) 500 MG 24 hr tablet Take 500 mg by mouth 2 (two) times daily. 07/27/20   [provider]  omeprazole (PRILOSEC) 20 MG capsule Take 20 mg by  mouth daily. 08/10/16   [provider]  ondansetron (ZOFRAN) 8 MG tablet Take 1 tablet (8 mg total) by mouth every 8 (eight) hours as needed for nausea or vomiting. Patient not taking: Reported on 10/08/2020 09/24/20   Ladell Pier, MD  prochlorperazine (COMPAZINE) 10 MG tablet Take 1 tablet (10 mg total) by mouth every 6 (six) hours as needed. Patient not taking: Reported on 10/08/2020 09/24/20   Ladell Pier, MD    Allergies    Patient has no known allergies.  Review of Systems   Review of Systems  Constitutional:  Positive for fatigue. Negative for fever.  HENT:  Negative  for sore throat.   Eyes:  Negative for visual disturbance.  Respiratory:  Negative for cough and shortness of breath.   Cardiovascular:  Negative for chest pain.  Gastrointestinal:  Positive for diarrhea (increaed output). Negative for abdominal pain, blood in stool, nausea and vomiting.  Genitourinary:  Negative for difficulty urinating and dysuria.  Musculoskeletal:  Negative for back pain and neck stiffness.  Skin:  Negative for rash.  Neurological:  Negative for syncope and headaches.   Physical Exam Updated Vital Signs BP (!) 149/87 (BP Location: Left Arm)   Pulse 90   Temp 97.8 F (36.6 C) (Oral)   Resp (!) 27   Ht 5\' 8"  (1.727 m)   Wt 114.1 kg   SpO2 100%   BMI 38.24 kg/m   Physical Exam Vitals and nursing note reviewed.  Constitutional:      General: He is not in acute distress.    Appearance: He is well-developed. He is not diaphoretic.  HENT:     Head: Normocephalic and atraumatic.  Eyes:     Conjunctiva/sclera: Conjunctivae normal.  Cardiovascular:     Rate and Rhythm: Normal rate and regular rhythm.     Heart sounds: Normal heart sounds. No murmur heard.   No friction rub. No gallop.  Pulmonary:     Effort: Pulmonary effort is normal. No respiratory distress.     Breath sounds: Normal breath sounds. No wheezing or rales.  Abdominal:     General: There is no distension.      Palpations: Abdomen is soft.     Tenderness: There is no abdominal tenderness. There is no guarding.  Musculoskeletal:     Cervical back: Normal range of motion.  Skin:    General: Skin is warm and dry.  Neurological:     Mental Status: He is alert and oriented to person, place, and time.    ED Results / Procedures / Treatments   Labs (all labs ordered are listed, but only abnormal results are displayed) Labs Reviewed  CBC WITH DIFFERENTIAL/PLATELET - Abnormal; Notable for the following components:      Result Value   WBC 10.6 (*)    RBC 4.17 (*)    Hemoglobin 10.8 (*)    HCT 30.5 (*)    MCV 73.1 (*)    MCH 25.9 (*)    RDW 17.6 (*)    Neutro Abs 7.8 (*)    All other components within normal limits  COMPREHENSIVE METABOLIC PANEL  LIPASE, BLOOD  URINALYSIS, ROUTINE W REFLEX MICROSCOPIC    EKG None  Radiology No results found.  Procedures Procedures   Medications Ordered in ED Medications  sodium chloride 0.9 % bolus 1,000 mL (1,000 mLs Intravenous New Bag/Given 11/15/20 1311)    ED Course  I have reviewed the triage vital signs and the nursing notes.  Pertinent labs & imaging results that were available during my care of the patient were reviewed by me and considered in my medical decision making (see chart for details).    MDM Rules/Calculators/A&P                           65yo male with history of htn, hlpd, DM, CVA, frontal meningioma with resection 2018, transverse colon cancer stage IIIb status post partial transverese colectomy and end colostomy 09/10/2020 on chemotherapy 6/1 first, presents with severe fatigue and increased ostomy output. No significant abdominal pain, with chronic symptoms, benign exam, doubt intraabominal abscess or other  acute intraabdominal pathology.  CXR without pneumonia or CHF. UA pending. Labs without significant change in electrolytes or renal function and with stable anemia. COVID test negative. UA pending at time of transfer of  care.Vital signs stable, anticipate discharge after return of urine. Overall suspect symptoms secondary to recent chemotherapy and malignancy, possible viral etiology.  Final Clinical Impression(s) / ED Diagnoses Final diagnoses:  None    Rx / DC Orders ED Discharge Orders     None        Gareth Morgan, MD 11/17/20 1452

## 2020-11-15 NOTE — ED Notes (Signed)
Port accessed by [employer] RN

## 2020-11-15 NOTE — ED Provider Notes (Signed)
Urinalysis negative.  Overall feeling better after IV fluids.  History of colon cancer with colostomy.  Having some increased output from there but no evidence of infection on exam or blood work.  Not having any urinary symptoms.  Overall suspect no urine infection.  Will send urine culture.  Discharged in good condition.   Lennice Sites, DO 11/15/20 [id number]

## 2020-11-15 NOTE — ED Notes (Signed)
Pt dc home via wheelchair. Pt states understanding of dc instructions.

## 2020-11-16 ENCOUNTER — Other Ambulatory Visit: Payer: Self-pay | Admitting: Oncology

## 2020-11-17 ENCOUNTER — Encounter: Payer: Self-pay | Admitting: Oncology

## 2020-11-17 ENCOUNTER — Other Ambulatory Visit (HOSPITAL_COMMUNITY): Payer: Self-pay

## 2020-11-17 LAB — URINE CULTURE: Culture: <10000 — AB

## 2020-11-17 MED ORDER — CAPECITABINE 500 MG PO TABS
2000.0000 mg | ORAL_TABLET | Freq: Two times a day (BID) | ORAL | 0 refills | Status: DC
  Filled 2020-11-17: qty 112, 21d supply, fill #0

## 2020-11-19 ENCOUNTER — Other Ambulatory Visit: Payer: Self-pay

## 2020-11-19 ENCOUNTER — Other Ambulatory Visit (HOSPITAL_COMMUNITY): Payer: Self-pay

## 2020-11-19 ENCOUNTER — Inpatient Hospital Stay: Payer: Medicare HMO

## 2020-11-19 ENCOUNTER — Inpatient Hospital Stay: Payer: Medicare HMO | Admitting: Oncology

## 2020-11-19 ENCOUNTER — Inpatient Hospital Stay: Payer: Medicare HMO | Attending: Oncology

## 2020-11-19 VITALS — BP 122/90 | HR 115 | Temp 97.5°F | Resp 20 | Wt 244.6 lb

## 2020-11-19 VITALS — BP 116/71 | HR 95 | Resp 18

## 2020-11-19 DIAGNOSIS — Z5111 Encounter for antineoplastic chemotherapy: Secondary | ICD-10-CM | POA: Insufficient documentation

## 2020-11-19 DIAGNOSIS — R0609 Other forms of dyspnea: Secondary | ICD-10-CM | POA: Insufficient documentation

## 2020-11-19 DIAGNOSIS — C184 Malignant neoplasm of transverse colon: Secondary | ICD-10-CM

## 2020-11-19 LAB — CMP (CANCER CENTER ONLY)
ALT: 19 U/L (ref 0–44)
AST: 12 U/L — ABNORMAL LOW (ref 15–41)
Albumin: 4.2 g/dL (ref 3.5–5.0)
Alkaline Phosphatase: 80 U/L (ref 38–126)
Anion gap: 14 (ref 5–15)
BUN: 42 mg/dL — ABNORMAL HIGH (ref 8–23)
CO2: 18 mmol/L — ABNORMAL LOW (ref 22–32)
Calcium: 9.7 mg/dL (ref 8.9–10.3)
Chloride: 106 mmol/L (ref 98–111)
Creatinine: 1.83 mg/dL — ABNORMAL HIGH (ref 0.61–1.24)
GFR, Estimated: 41 mL/min — ABNORMAL LOW (ref >60)
Glucose, Bld: 198 mg/dL — ABNORMAL HIGH (ref 70–99)
Potassium: 4.4 mmol/L (ref 3.5–5.1)
Sodium: 138 mmol/L (ref 135–145)
Total Bilirubin: 0.7 mg/dL (ref 0.3–1.2)
Total Protein: 7.2 g/dL (ref 6.5–8.1)

## 2020-11-19 LAB — CBC WITH DIFFERENTIAL (CANCER CENTER ONLY)
Abs Immature Granulocytes: 0.05 10*3/uL (ref 0.00–0.07)
Basophils Absolute: 0.1 10*3/uL (ref 0.0–0.1)
Basophils Relative: 1 %
Eosinophils Absolute: 0.1 10*3/uL (ref 0.0–0.5)
Eosinophils Relative: 1 %
HCT: 32.1 % — ABNORMAL LOW (ref 39.0–52.0)
Hemoglobin: 11.2 g/dL — ABNORMAL LOW (ref 13.0–17.0)
Immature Granulocytes: 1 %
Lymphocytes Relative: 19 %
Lymphs Abs: 1.6 10*3/uL (ref 0.7–4.0)
MCH: 25.9 pg — ABNORMAL LOW (ref 26.0–34.0)
MCHC: 34.9 g/dL (ref 30.0–36.0)
MCV: 74.3 fL — ABNORMAL LOW (ref 80.0–100.0)
Monocytes Absolute: 1.5 10*3/uL — ABNORMAL HIGH (ref 0.1–1.0)
Monocytes Relative: 18 %
Neutro Abs: 5 10*3/uL (ref 1.7–7.7)
Neutrophils Relative %: 60 %
Platelet Count: 212 10*3/uL (ref 150–400)
RBC: 4.32 MIL/uL (ref 4.22–5.81)
RDW: 19.5 % — ABNORMAL HIGH (ref 11.5–15.5)
WBC Count: 8.3 10*3/uL (ref 4.0–10.5)
nRBC: 0 % (ref 0.0–0.2)

## 2020-11-19 MED ORDER — DEXTROSE 5 % IV SOLN
Freq: Once | INTRAVENOUS | Status: AC
  Filled 2020-11-19: qty 250

## 2020-11-19 MED ORDER — SODIUM CHLORIDE 0.9% FLUSH
10.0000 mL | INTRAVENOUS | Status: DC | PRN
  Administered 2020-11-19: 10 mL
  Filled 2020-11-19: qty 10

## 2020-11-19 MED ORDER — CAPECITABINE 500 MG PO TABS: 1500.0000 mg | ORAL_TABLET | Freq: Two times a day (BID) | ORAL | Status: DC

## 2020-11-19 MED ORDER — SODIUM CHLORIDE 0.9 % IV SOLN
Freq: Once | INTRAVENOUS | Status: AC
  Filled 2020-11-19: qty 250

## 2020-11-19 MED ORDER — OXALIPLATIN CHEMO INJECTION 100 MG/20ML
100.0000 mg/m2 | Freq: Once | INTRAVENOUS | Status: DC
  Filled 2020-11-19: qty 46

## 2020-11-19 MED ORDER — PALONOSETRON HCL INJECTION 0.25 MG/5ML
0.2500 mg | Freq: Once | INTRAVENOUS | Status: AC
Start: 2020-11-19 — End: 2020-11-19
  Administered 2020-11-19: 0.25 mg via INTRAVENOUS
  Filled 2020-11-19: qty 5

## 2020-11-19 MED ORDER — OXALIPLATIN CHEMO INJECTION 100 MG/20ML
85.0000 mg/m2 | Freq: Once | INTRAVENOUS | Status: AC
  Administered 2020-11-19: 195 mg via INTRAVENOUS
  Filled 2020-11-19: qty 39

## 2020-11-19 MED ORDER — SODIUM CHLORIDE 0.9 % IV SOLN
10.0000 mg | Freq: Once | INTRAVENOUS | Status: AC
  Administered 2020-11-19: 10 mg via INTRAVENOUS
  Filled 2020-11-19: qty 1

## 2020-11-19 MED ORDER — HEPARIN SOD (PORK) LOCK FLUSH 100 UNIT/ML IV SOLN
500.0000 [IU] | Freq: Once | INTRAVENOUS | Status: AC | PRN
  Administered 2020-11-19: 500 [IU]
  Filled 2020-11-19: qty 5

## 2020-11-19 NOTE — Telephone Encounter (Signed)
Per Dr Therese Sarah prescription changed to 1500 mg twice a day.

## 2020-11-19 NOTE — Progress Notes (Signed)
[facility] OFFICE PROGRESS NOTE   Diagnosis: Colon cancer  INTERVAL HISTORY:   Thomas [name] returns as scheduled.  He completed another cycle of CAPOX beginning 10/29/2020.  No nausea/vomiting, cold sensitivity, or peripheral neuropathy symptoms.  He has mild intermittent discomfort in the hands and feet.  He has skin thickening and hyperpigmentation of the hands and feet.  He has noted "chapped lips ". He empties the colostomy 4-5 times per day.  The stool is formed.  He was seen in the emergency room on 11/15/2020 with increased ostomy output and decreased energy.  He received intravenous fluids and felt better.  A urinalysis revealed white cells and urine culture was negative.  He fell in his bathroom a few days prior to going to the emergency room.  He has exertional dyspnea today.  Objective:  Vital signs in last 24 hours:  Blood pressure 122/90, pulse (!) 115, temperature (!) 97.5 F (36.4 C), temperature source Temporal, resp. rate 20, weight 244 lb 9.6 oz (110.9 kg).    HEENT: No thrush, superficial ulcerations at the outer upper and lower lip Resp: Lungs clear bilaterally Cardio: Regular rate and rhythm GI: Right abdomen colostomy with formed brown stool, no hepatomegaly, nontender Vascular: No leg edema  Skin: Hyperpigmentation and skin thickening of the hands, dryness and callus formation at the soles  Portacath/PICC-without erythema  Lab Results:  Lab Results  Component Value Date   WBC 8.3 11/19/2020   HGB 11.2 (L) 11/19/2020   HCT 32.1 (L) 11/19/2020   MCV 74.3 (L) 11/19/2020   PLT 212 11/19/2020   NEUTROABS 5.0 11/19/2020    CMP  Lab Results  Component Value Date   NA 138 11/19/2020   K 4.4 11/19/2020   CL 106 11/19/2020   CO2 18 (L) 11/19/2020   GLUCOSE 198 (H) 11/19/2020   BUN 42 (H) 11/19/2020   CREATININE 1.83 (H) 11/19/2020   CALCIUM 9.7 11/19/2020   PROT 7.2 11/19/2020   ALBUMIN 4.2 11/19/2020   AST 12 (L) 11/19/2020   ALT 19  11/19/2020   ALKPHOS 80 11/19/2020   BILITOT 0.7 11/19/2020   GFRNONAA 41 (L) 11/19/2020   GFRAA >60 04/01/2017    Lab Results  Component Value Date   CEA1 1.7 09/05/2020   CEA <1.00 10/08/2020    Lab Results  Component Value Date   INR 0.98 08/29/2015   LABPROT 13.2 08/29/2015    Imaging:  DG Chest Portable 1 View  Result Date: 11/15/2020 CLINICAL DATA:  Fatigue, colon cancer EXAM: PORTABLE CHEST 1 VIEW COMPARISON:  10/01/2020 FINDINGS: Right Port-A-Cath remains in place, unchanged. Heart and mediastinal contours are within normal limits. No focal opacities or effusions. No acute bony abnormality. IMPRESSION: No active disease. Electronically Signed   By: Rolm Baptise M.D.   On: 11/15/2020 14:34    Medications: I have reviewed the patient's current medications.   Assessment/Plan: Transverse colon cancer, stage IIIb (T3N1a), status post a partial transverse colectomy and end colostomy 09/10/2020 CT abdomen/pelvis 09/04/2020- possible transverse colon mass with small regional lymph nodes Colonoscopy 09/05/2020- obstructing transverse colon mass-biopsy invasive adenocarcinoma, mass could not be passed CT chest 09/09/2020-no evidence of metastatic disease, 3 x 2 mm subpleural nodule in the right upper lobe likely benign subpleural lymph node Partial transverse colectomy 09/10/2020, transverse colon tumor, no lymphovascular perineural invasion, 1/19 lymph nodes positive, negative margins, MSI stable, no loss of mismatch repair protein expression Cycle 1 CAPOX 10/08/2020, oxaliplatin dose reduced to 100 mg per metered squared  secondary to renal insufficiency Cycle 2 CAPOX 10/29/2020 Cycle 3 CAPOX 11/19/2020, capecitabine dose reduced secondary to hand/foot syndrome, oxaliplatin dose reduced secondary to renal failure and poor performance status Resection of a frontal meningioma 09/01/2016 Diabetes Rectal polyp- tubular adenoma on colonoscopy 09/05/2020 Hypertension Diabetic  neuropathy Epilepsy Family history of breast and prostate cancer Admission with acute renal failure 10/01/2020-secondary to lack of oral intake and lisinopril, improved with intravenous hydration and holding lisinopril    Disposition: Thomas [name] has completed 2 cycles of adjuvant chemotherapy.  He has developed moderate hand/foot syndrome.  He was seen in the emergency after cycle 2 with generalized weakness.  He has exertional dyspnea today.  The creatinine is higher.  We discussed delaying today's treatment.  He would like to proceed with cycle 3 CAPOX today.  The capecitabine will be dose reduced to 1500 mg twice daily with this cycle.  The oxaliplatin will also be dose reduced.  We will contact him early next week to assess his status and decide on the need for an office visit.  He received intravenous fluids prior to chemotherapy today and was able to ambulate without difficulty.    Betsy Coder, MD  11/19/2020  10:52 AM

## 2020-11-19 NOTE — Progress Notes (Signed)
Patient given 523mL NS over 1 hour.  VS rechecked upon completion of fluids. Patient was able to ambulate to bathroom without difficulty.  Dr. Benay Spice notified.  Per Dr. Benay Spice, ok to proceed with treatment today with VS and Scr 1.83.

## 2020-11-19 NOTE — Patient Instructions (Signed)
[facility]  Discharge Instructions: Thank you for choosing [facility] to provide your oncology and hematology care.   If you have a lab appointment with the Cancer Center, please go directly to the Cancer Center and check in at the registration area.   Wear comfortable clothing and clothing appropriate for easy access to any Portacath or PICC line.   We strive to give you quality time with your provider. You may need to reschedule your appointment if you arrive late (15 or more minutes).  Arriving late affects you and other patients whose appointments are after yours.  Also, if you miss three or more appointments without notifying the office, you may be dismissed from the clinic at the provider's discretion.      For prescription refill requests, have your pharmacy contact our office and allow 72 hours for refills to be completed.    Today you received the following chemotherapy and/or immunotherapy agents oxaliplatin      To help prevent nausea and vomiting after your treatment, we encourage you to take your nausea medication as directed.  BELOW ARE SYMPTOMS THAT SHOULD BE REPORTED IMMEDIATELY: *FEVER GREATER THAN 100.4 F (38 C) OR HIGHER *CHILLS OR SWEATING *NAUSEA AND VOMITING THAT IS NOT CONTROLLED WITH YOUR NAUSEA MEDICATION *UNUSUAL SHORTNESS OF BREATH *UNUSUAL BRUISING OR BLEEDING *URINARY PROBLEMS (pain or burning when urinating, or frequent urination) *BOWEL PROBLEMS (unusual diarrhea, constipation, pain near the anus) TENDERNESS IN MOUTH AND THROAT WITH OR WITHOUT PRESENCE OF ULCERS (sore throat, sores in mouth, or a toothache) UNUSUAL RASH, SWELLING OR PAIN  UNUSUAL VAGINAL DISCHARGE OR ITCHING   Items with * indicate a potential emergency and should be followed up as soon as possible or go to the Emergency Department if any problems should occur.  Please show the CHEMOTHERAPY ALERT CARD or IMMUNOTHERAPY ALERT CARD at check-in to  the Emergency Department and triage nurse.  Should you have questions after your visit or need to cancel or reschedule your appointment, please contact [facility]  Dept: [phone]  and follow the prompts.  Office hours are 8:00 a.m. to 4:30 p.m. Monday - Friday. Please note that voicemails left after 4:00 p.m. may not be returned until the following business day.  We are closed weekends and major holidays. You have access to a nurse at all times for urgent questions. Please call the main number to the clinic Dept: [phone] and follow the prompts.   For any non-urgent questions, you may also contact your provider using MyChart. We now offer e-Visits for anyone 18 and older to request care online for non-urgent symptoms. For details visit [email or web address].   Also download the MyChart app! Go to the app store, search "MyChart", open the app, select [facility], and log in with your MyChart username and password.  Due to Covid, a mask is required upon entering the hospital/clinic. If you do not have a mask, one will be given to you upon arrival. For doctor visits, patients may have 1 support person aged 18 or older with them. For treatment visits, patients cannot have anyone with them due to current Covid guidelines and our immunocompromised population.   Oxaliplatin Injection What is this medication? OXALIPLATIN (ox AL i PLA tin) is a chemotherapy drug. It targets fast dividing cells, like cancer cells, and causes these cells to die. This medicine is used to treat cancers of the colon and rectum, and many other cancers. This medicine may   be used for other purposes; ask your health care provider or pharmacist if you have questions. COMMON BRAND NAME(S): Eloxatin What should I tell my care team before I take this medication? They need to know if you have any of these conditions: heart disease history of irregular heartbeat liver disease low blood counts, like  white cells, platelets, or red blood cells lung or breathing disease, like asthma take medicines that treat or prevent blood clots tingling of the fingers or toes, or other nerve disorder an unusual or allergic reaction to oxaliplatin, other chemotherapy, other medicines, foods, dyes, or preservatives pregnant or trying to get pregnant breast-feeding How should I use this medication? This drug is given as an infusion into a vein. It is administered in a hospital or clinic by a specially trained health care professional. Talk to your pediatrician regarding the use of this medicine in children. Special care may be needed. Overdosage: If you think you have taken too much of this medicine contact a poison control center or emergency room at once. NOTE: This medicine is only for you. Do not share this medicine with others. What if I miss a dose? It is important not to miss a dose. Call your doctor or health care professional if you are unable to keep an appointment. What may interact with this medication? Do not take this medicine with any of the following medications: cisapride dronedarone pimozide thioridazine This medicine may also interact with the following medications: aspirin and aspirin-like medicines certain medicines that treat or prevent blood clots like warfarin, apixaban, dabigatran, and rivaroxaban cisplatin cyclosporine diuretics medicines for infection like acyclovir, adefovir, amphotericin B, bacitracin, cidofovir, foscarnet, ganciclovir, gentamicin, pentamidine, vancomycin NSAIDs, medicines for pain and inflammation, like ibuprofen or naproxen other medicines that prolong the QT interval (an abnormal heart rhythm) pamidronate zoledronic acid This list may not describe all possible interactions. Give your health care provider a list of all the medicines, herbs, non-prescription drugs, or dietary supplements you use. Also tell them if you smoke, drink alcohol, or use illegal  drugs. Some items may interact with your medicine. What should I watch for while using this medication? Your condition will be monitored carefully while you are receiving this medicine. You may need blood work done while you are taking this medicine. This medicine may make you feel generally unwell. This is not uncommon as chemotherapy can affect healthy cells as well as cancer cells. Report any side effects. Continue your course of treatment even though you feel ill unless your healthcare professional tells you to stop. This medicine can make you more sensitive to cold. Do not drink cold drinks or use ice. Cover exposed skin before coming in contact with cold temperatures or cold objects. When out in cold weather wear warm clothing and cover your mouth and nose to warm the air that goes into your lungs. Tell your doctor if you get sensitive to the cold. Do not become pregnant while taking this medicine or for 9 months after stopping it. Women should inform their health care professional if they wish to become pregnant or think they might be pregnant. Men should not father a child while taking this medicine and for 6 months after stopping it. There is potential for serious side effects to an unborn child. Talk to your health care professional for more information. Do not breast-feed a child while taking this medicine or for 3 months after stopping it. This medicine has caused ovarian failure in some women. This medicine may make   concerned about your fertility. This medicine has caused decreased sperm counts in some men. This may make it more difficult to father a child. Talk to your health care professional if [place] concerned about your fertility. This medicine may increase your risk of getting an infection. Call your health care professional for advice if you get a fever, chills, or sore throat, or other symptoms of a cold or flu. Do  not treat yourself. Try to avoid beingaround people who are sick. Avoid taking medicines that contain aspirin, acetaminophen, ibuprofen, naproxen, or ketoprofen unless instructed by your health care professional.These medicines may hide a fever. Be careful brushing or flossing your teeth or using a toothpick because you may get an infection or bleed more easily. If you have any dental work done, Primary school teacher you are receiving this medicine. What side effects may I notice from receiving this medication? Side effects that you should report to your doctor or health care professionalas soon as possible: allergic reactions like skin rash, itching or hives, swelling of the face, lips, or tongue breathing problems cough low blood counts - this medicine may decrease the number of white blood cells, red blood cells, and platelets. You may be at increased risk for infections and bleeding nausea, vomiting pain, redness, or irritation at site where injected pain, tingling, numbness in the hands or feet signs and symptoms of bleeding such as bloody or black, tarry stools; red or dark brown urine; spitting up blood or brown material that looks like coffee grounds; red spots on the skin; unusual bruising or bleeding from the eyes, gums, or nose signs and symptoms of a dangerous change in heartbeat or heart rhythm like chest pain; dizziness; fast, irregular heartbeat; palpitations; feeling faint or lightheaded; falls signs and symptoms of infection like fever; chills; cough; sore throat; pain or trouble passing urine signs and symptoms of liver injury like dark yellow or brown urine; general ill feeling or flu-like symptoms; light-colored stools; loss of appetite; nausea; right upper belly pain; unusually weak or tired; yellowing of the eyes or skin signs and symptoms of low red blood cells or anemia such as unusually weak or tired; feeling faint or lightheaded; falls signs and symptoms of muscle injury like  dark urine; trouble passing urine or change in the amount of urine; unusually weak or tired; muscle pain; back pain Side effects that usually do not require medical attention (report to yourdoctor or health care professional if they continue or are bothersome): changes in taste diarrhea gas hair loss loss of appetite mouth sores This list may not describe all possible side effects. Call your doctor for medical advice about side effects. You may report side effects to FDA at1-800-FDA-1088. Where should I keep my medication? This drug is given in a hospital or clinic and will not be stored at home. NOTE: This sheet is a summary. It may not cover all possible information. If you have questions about this medicine, talk to your doctor, pharmacist, orhealth care provider.  2022 Elsevier/Gold Standard (2018-09-13 12:20:35)  Rehydration, Adult Rehydration is the replacement of body fluids, salts, and minerals (electrolytes) that are lost during dehydration. Dehydration is when there is not enough water or other fluids in the body. This happens when you lose more fluids than you take in. Common causes of dehydration include: Not drinking enough fluids. This can occur when you are ill or doing activities that require a lot of energy, especially in hot weather. Conditions that cause loss of water or other  fluids, such as diarrhea, vomiting, sweating, or urinating a lot. Other illnesses, such as fever or infection. Certain medicines, such as those that remove excess fluid from the body (diuretics). Symptoms of mild or moderate dehydration may include thirst, dry lips and mouth, and dizziness. Symptoms of severe dehydration may include increasedheart rate, confusion, fainting, and not urinating. For severe dehydration, you may need to get fluids through an IV at the hospital. For mild or moderate dehydration, you can usually rehydrate at homeby drinking certain fluids as told by your health care  provider. What are the risks? Generally, rehydration is safe. However, taking in too much fluid (overhydration) can be a problem. This is rare. Overhydration can cause an electrolyte imbalance, kidney failure, or a decrease in salt (sodium) levels in the body. Supplies needed You will need an oral rehydration solution (ORS) if your health care provider tells you to use one. This is a drink to treat dehydration. It can be found inpharmacies and retail stores. How to rehydrate Fluids Follow instructions from your health care provider for rehydration. The kind of fluid and the amount you should drink depend on your condition. In general, you should choose drinks that you prefer. If told by your health care provider, drink an ORS. Make an ORS by following instructions on the package. Start by drinking small amounts, about  cup (120 mL) every 5-10 minutes. Slowly increase how much you drink until you have taken the amount recommended by your health care provider. Drink enough clear fluids to keep your urine pale yellow. If you were told to drink an ORS, finish it first, then start slowly drinking other clear fluids. Drink fluids such as: Water. This includes sparkling water and flavored water. Drinking only water can lead to having too little sodium in your body (hyponatremia). Follow the advice of your health care provider. Water from ice chips you suck on. Fruit juice with water you add to it (diluted). Sports drinks. Hot or cold herbal teas. Broth-based soups. Milk or milk products. Food Follow instructions from your health care provider about what to eat while you rehydrate. Your health care provider may recommend that you slowly begin eating regular foods in small amounts. Eat foods that contain a healthy balance of electrolytes, such as bananas, oranges, potatoes, tomatoes, and spinach. Avoid foods that are greasy or contain a lot of sugar. In some cases, you may get nutrition through a  feeding tube that is passed through your nose and into your stomach (nasogastric tube, or NG tube). This may be done if you have uncontrolled vomiting or diarrhea. Beverages to avoid  Certain beverages may make dehydration worse. While you rehydrate, avoiddrinking alcohol. How to tell if you are recovering from dehydration You may be recovering from dehydration if: You are urinating more often than before you started rehydrating. Your urine is pale yellow. Your energy level improves. You vomit less frequently. You have diarrhea less frequently. Your appetite improves or returns to normal. You feel less dizzy or less light-headed. Your skin tone and color start to look more normal. Follow these instructions at home: Take over-the-counter and prescription medicines only as told by your health care provider. Do not take sodium tablets. Doing this can lead to having too much sodium in your body (hypernatremia). Contact a health care provider if: You continue to have symptoms of mild or moderate dehydration, such as: Thirst. Dry lips. Slightly dry mouth. Dizziness. Dark urine or less urine than normal. Muscle cramps. You continue to  vomit or have diarrhea. Get help right away if you: Have symptoms of dehydration that get worse. Have a fever. Have a severe headache. Have been vomiting and the following happens: Your vomiting gets worse or does not go away. Your vomit includes blood or green matter (bile). You cannot eat or drink without vomiting. Have problems with urination or bowel movements, such as: Diarrhea that gets worse or does not go away. Blood in your stool (feces). This may cause stool to look black and tarry. Not urinating, or urinating only a small amount of very dark urine, within 6-8 hours. Have trouble breathing. Have symptoms that get worse with treatment. These symptoms may represent a serious problem that is an emergency. Do not wait to see if the symptoms will  go away. Get medical help right away. Call your local emergency services (911 in the [place] Do not drive yourself to the hospital. Summary Rehydration is the replacement of body fluids and minerals (electrolytes) that are lost during dehydration. Follow instructions from your health care provider for rehydration. The kind of fluid and amount you should drink depend on your condition. Slowly increase how much you drink until you have taken the amount recommended by your health care provider. Contact your health care provider if you continue to show signs of mild or moderate dehydration. This information is not intended to replace advice given to you by your health care provider. Make sure you discuss any questions you have with your healthcare provider. Document Revised: 06/27/2019 Document Reviewed: 05/07/2019 Elsevier Patient Education  2022 [employer].

## 2020-11-19 NOTE — Patient Instructions (Signed)
Implanted Port Home Guide An implanted port is a device that is placed under the skin. It is usually placed in the chest. The device can be used to give IV medicine, to take blood, or for dialysis. You may have an implanted port if: You need IV medicine that would be irritating to the small veins in your hands or arms. You need IV medicines, such as antibiotics, for a long period of time. You need IV nutrition for a long period of time. You need dialysis. When you have a port, your health care provider can choose to use the port instead of veins in your arms for these procedures. You may have fewer limitations when using a port than you would if you used other types of long-term IVs, and you will likely be able to return to normal activities afteryour incision heals. An implanted port has two main parts: Reservoir. The reservoir is the part where a needle is inserted to give medicines or draw blood. The reservoir is round. After it is placed, it appears as a small, raised area under your skin. Catheter. The catheter is a thin, flexible tube that connects the reservoir to a vein. Medicine that is inserted into the reservoir goes into the catheter and then into the vein. How is my port accessed? To access your port: A numbing cream may be placed on the skin over the port site. Your health care provider will put on a mask and sterile gloves. The skin over your port will be cleaned carefully with a germ-killing soap and allowed to dry. Your health care provider will gently pinch the port and insert a needle into it. Your health care provider will check for a blood return to make sure the port is in the vein and is not clogged. If your port needs to remain accessed to get medicine continuously (constant infusion), your health care provider will place a clear bandage (dressing) over the needle site. The dressing and needle will need to be changed every week, or as told by your health care provider. What  is flushing? Flushing helps keep the port from getting clogged. Follow instructions from your health care provider about how and when to flush the port. Ports are usually flushed with saline solution or a medicine called heparin. The need for flushing will depend on how the port is used: If the port is only used from time to time to give medicines or draw blood, the port may need to be flushed: Before and after medicines have been given. Before and after blood has been drawn. As part of routine maintenance. Flushing may be recommended every 4-6 weeks. If a constant infusion is running, the port may not need to be flushed. Throw away any syringes in a disposal container that is meant for sharp items (sharps container). You can buy a sharps container from a pharmacy, or you can make one by using an empty hard plastic bottle with a cover. How long will my port stay implanted? The port can stay in for as long as your health care provider thinks it is needed. When it is time for the port to come out, a surgery will be done to remove it. The surgery will be similar to the procedure that was done to putthe port in. Follow these instructions at home:  Flush your port as told by your health care provider. If you need an infusion over several days, follow instructions from your health care provider about how to take   care of your port site. Make sure you: Wash your hands with soap and water before you change your dressing. If soap and water are not available, use alcohol-based hand sanitizer. Change your dressing as told by your health care provider. Place any used dressings or infusion bags into a plastic bag. Throw that bag in the trash. Keep the dressing that covers the needle clean and dry. Do not get it wet. Do not use scissors or sharp objects near the tube. Keep the tube clamped, unless it is being used. Check your port site every day for signs of infection. Check for: Redness, swelling, or  pain. Fluid or blood. Pus or a bad smell. Protect the skin around the port site. Avoid wearing bra straps that rub or irritate the site. Protect the skin around your port from seat belts. Place a soft pad over your chest if needed. Bathe or shower as told by your health care provider. The site may get wet as long as you are not actively receiving an infusion. Return to your normal activities as told by your health care provider. Ask your health care provider what activities are safe for you. Carry a medical alert card or wear a medical alert bracelet at all times. This will let health care providers know that you have an implanted port in case of an emergency. Get help right away if: You have redness, swelling, or pain at the port site. You have fluid or blood coming from your port site. You have pus or a bad smell coming from the port site. You have a fever. Summary Implanted ports are usually placed in the chest for long-term IV access. Follow instructions from your health care provider about flushing the port and changing bandages (dressings). Take care of the area around your port by avoiding clothing that puts pressure on the area, and by watching for signs of infection. Protect the skin around your port from seat belts. Place a soft pad over your chest if needed. Get help right away if you have a fever or you have redness, swelling, pain, drainage, or a bad smell at the port site. This information is not intended to replace advice given to you by your health care provider. Make sure you discuss any questions you have with your healthcare provider. Document Revised: 09/10/2019 Document Reviewed: 09/10/2019 Elsevier Patient Education  2022 [employer] Inc.  

## 2020-11-21 ENCOUNTER — Telehealth: Payer: Self-pay | Admitting: Dietician

## 2020-11-21 ENCOUNTER — Inpatient Hospital Stay: Payer: Medicare HMO | Admitting: Dietician

## 2020-11-21 NOTE — Telephone Encounter (Signed)
Nutrition Follow-up:   Patient with stage IIIb colon cancer. 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 Patient receiving adjuvant chemotherapy with CAPOX.   Spoke with patient via telephone. He reports feeling fatigued, says his hands are bothering him, but doing a little better. He is keeping them hydrated with creams. Patient is eating 4-5 times/day. Son makes him a smoothie (yogurt, oatmeal, raw fruits) for breakfast, has a bowl of cereal, eggs, or grits with cheese for snack, eats his biggest meal for lunch - had collards, meatloaf, yams, yesterday. Patient will snack on fruit later in the evening, recalls whole cherries, watermelon, apples, cantaloupe. He is drinking water, ginger ale, juice, and Pedialyte. Patient reports emptying ostomy bag 4-5 times/day, says he is having less formed stool.    Medications: reviewed  Labs: 7/13 Glucose 198, BUN 42, Cr 1.83  Anthropometrics: Weight 244 lb 9.6 oz on 7/13 decreased 14 lbs (5.4%) in 2 weeks and 27 lbs (10%) over the last 6 weeks. This is significant.   6/22 - 258 lb 6.4 oz 6/1 - 271 lb 9.6 oz 5/17 - 278 lb   NUTRITION DIAGNOSIS: Food and nutrition related knowledge deficit ongoing   MALNUTRITION DIAGNOSIS: Suspect patient likely to meet criteria for malnutrition given significant weight loss, however unable to identify without completion of nutrition focused physical exam.    INTERVENTION:  Reinforced importance of adequate calorie and protein energy intake to maintain strength, weights, nutrition  Discussed concerns about continued weight loss, encouraged eating high calorie, high protein foods - will mail handout Discussed ways to add calories and protein to foods Encouraged drinking 2-3 Ensure Plus/equivalent daily (350 kcal, 16 g protein each)  Will mail Ensure coupons Patient has contact information     MONITORING, EVALUATION, GOAL: weight trends, intake   NEXT VISIT:  To be scheduled via telephone ~4  weeks

## 2020-11-27 ENCOUNTER — Emergency Department (HOSPITAL_BASED_OUTPATIENT_CLINIC_OR_DEPARTMENT_OTHER): Payer: Medicare HMO

## 2020-11-27 ENCOUNTER — Other Ambulatory Visit: Payer: Self-pay

## 2020-11-27 ENCOUNTER — Emergency Department (HOSPITAL_BASED_OUTPATIENT_CLINIC_OR_DEPARTMENT_OTHER)
Admission: EM | Admit: 2020-11-27 | Discharge: 2020-11-28 | Disposition: A | Discharge status: Home / Self Care | Payer: Medicare HMO | Source: Home / Self Care | Attending: Emergency Medicine | Admitting: Emergency Medicine

## 2020-11-27 ENCOUNTER — Encounter (HOSPITAL_BASED_OUTPATIENT_CLINIC_OR_DEPARTMENT_OTHER): Payer: Self-pay

## 2020-11-27 DIAGNOSIS — I10 Essential (primary) hypertension: Secondary | ICD-10-CM | POA: Insufficient documentation

## 2020-11-27 DIAGNOSIS — Z87891 Personal history of nicotine dependence: Secondary | ICD-10-CM | POA: Insufficient documentation

## 2020-11-27 DIAGNOSIS — Z7984 Long term (current) use of oral hypoglycemic drugs: Secondary | ICD-10-CM | POA: Insufficient documentation

## 2020-11-27 DIAGNOSIS — Z85038 Personal history of other malignant neoplasm of large intestine: Secondary | ICD-10-CM | POA: Insufficient documentation

## 2020-11-27 DIAGNOSIS — Z79899 Other long term (current) drug therapy: Secondary | ICD-10-CM | POA: Insufficient documentation

## 2020-11-27 DIAGNOSIS — Z5111 Encounter for antineoplastic chemotherapy: Secondary | ICD-10-CM | POA: Diagnosis not present

## 2020-11-27 DIAGNOSIS — E119 Type 2 diabetes mellitus without complications: Secondary | ICD-10-CM | POA: Insufficient documentation

## 2020-11-27 DIAGNOSIS — E1142 Type 2 diabetes mellitus with diabetic polyneuropathy: Secondary | ICD-10-CM | POA: Insufficient documentation

## 2020-11-27 DIAGNOSIS — R109 Unspecified abdominal pain: Secondary | ICD-10-CM | POA: Diagnosis not present

## 2020-11-27 DIAGNOSIS — J45909 Unspecified asthma, uncomplicated: Secondary | ICD-10-CM | POA: Insufficient documentation

## 2020-11-27 DIAGNOSIS — E860 Dehydration: Secondary | ICD-10-CM

## 2020-11-27 DIAGNOSIS — C189 Malignant neoplasm of colon, unspecified: Secondary | ICD-10-CM

## 2020-11-27 DIAGNOSIS — W19XXXA Unspecified fall, initial encounter: Secondary | ICD-10-CM | POA: Insufficient documentation

## 2020-11-27 DIAGNOSIS — E1100 Type 2 diabetes mellitus with hyperosmolarity without nonketotic hyperglycemic-hyperosmolar coma (NKHHC): Secondary | ICD-10-CM | POA: Diagnosis not present

## 2020-11-27 DIAGNOSIS — R531 Weakness: Secondary | ICD-10-CM | POA: Insufficient documentation

## 2020-11-27 LAB — CBC
HCT: 32.9 % — ABNORMAL LOW (ref 39.0–52.0)
Hemoglobin: 11.6 g/dL — ABNORMAL LOW (ref 13.0–17.0)
MCH: 26 pg (ref 26.0–34.0)
MCHC: 35.3 g/dL (ref 30.0–36.0)
MCV: 73.6 fL — ABNORMAL LOW (ref 80.0–100.0)
Platelets: 202 10*3/uL (ref 150–400)
RBC: 4.47 MIL/uL (ref 4.22–5.81)
RDW: 19.9 % — ABNORMAL HIGH (ref 11.5–15.5)
WBC: 5.4 10*3/uL (ref 4.0–10.5)
nRBC: 0 % (ref 0.0–0.2)

## 2020-11-27 LAB — COMPREHENSIVE METABOLIC PANEL
ALT: 18 U/L (ref 0–44)
AST: 17 U/L (ref 15–41)
Albumin: 3.7 g/dL (ref 3.5–5.0)
Alkaline Phosphatase: 72 U/L (ref 38–126)
Anion gap: 14 (ref 5–15)
BUN: 52 mg/dL — ABNORMAL HIGH (ref 8–23)
CO2: 16 mmol/L — ABNORMAL LOW (ref 22–32)
Calcium: 8.8 mg/dL — ABNORMAL LOW (ref 8.9–10.3)
Chloride: 101 mmol/L (ref 98–111)
Creatinine, Ser: 1.6 mg/dL — ABNORMAL HIGH (ref 0.61–1.24)
GFR, Estimated: 48 mL/min — ABNORMAL LOW (ref >60)
Glucose, Bld: 262 mg/dL — ABNORMAL HIGH (ref 70–99)
Potassium: 5.3 mmol/L — ABNORMAL HIGH (ref 3.5–5.1)
Sodium: 131 mmol/L — ABNORMAL LOW (ref 135–145)
Total Bilirubin: 1.1 mg/dL (ref 0.3–1.2)
Total Protein: 6.7 g/dL (ref 6.5–8.1)

## 2020-11-27 MED ORDER — SODIUM CHLORIDE 0.9 % IV BOLUS
1000.0000 mL | Freq: Once | INTRAVENOUS | Status: AC
  Administered 2020-11-27: 1000 mL via INTRAVENOUS

## 2020-11-27 MED ORDER — LACTATED RINGERS IV BOLUS
1000.0000 mL | Freq: Once | INTRAVENOUS | Status: AC
  Administered 2020-11-27: 1000 mL via INTRAVENOUS

## 2020-11-27 NOTE — ED Triage Notes (Signed)
Patient BIB [facility] from Home with Frequent Falls.  Patient recently began Cancer Treatment for Colon Cancer approximately 3 months ago.  Patient here for Fall today. Patient has been having more frequent falls due to Pain in Hands/Feet and Weakness.  GCS 15. BIB Stretcher.

## 2020-11-27 NOTE — ED Provider Notes (Signed)
[place] EMERGENCY DEPT Provider Note   CSN: [id number] Arrival date & time: 11/27/20  1942     History Chief Complaint  Patient presents with   Fall   Weakness    Thomas [name] is a 65 y.o. male.  Patient w hx colon ca, s/p chemo ~ 4 weeks ago, c/o generalized weakness and feels dehydrated. Pt indicates recently saw his oncologist for same, and was told if falls again to come to ED for ivf. Patient indicates today was walking to bathroom, felt generally weak/faint, legs gave way and he fell. Denies injury or pain. Denies head injury or loc. No neck or back pain. Denies any associated chest pain or discomfort. No sob or unusual doe. No anticoag use. No abd pain or vomiting. States normal ostomy output. In past month has noted decreased appetite and oral intake. Denies change in meds or new meds.   The history is provided by the patient, the EMS personnel and medical records.  Fall Pertinent negatives include no chest pain, no abdominal pain, no headaches and no shortness of breath.  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   Dizziness    Dyspnea    pt states d/t weight   Epilepsy ([place])    takes Keppra daily   GERD (gastroesophageal reflux disease)    takes Omeprazole daily   Headache    Hyperlipidemia    takes Atorvastatin daily   Hypertension    takes Lotrel daily   Peripheral edema    takes Lasix daily   Peripheral neuropathy    takes Gabapentin daily   Pneumonia 3 yrs ago   hx  of   Seizures ([facility])    Sleep apnea    Urinary frequency     Patient Active Problem List   Diagnosis Date Noted   Acute renal failure (ARF) ([place]) 10/01/2020   Hyponatremia 10/01/2020   Iron deficiency anemia due to chronic blood loss 97/06/6376   Metabolic acidosis 58/85/0277   Colon cancer ([place]) 09/23/2020   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   History of ischemic left MCA stroke 2015 09/11/2020   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    Rectal polyp    Diverticulosis of colon without hemorrhage    Abdominal pain 09/04/2020   Diabetic polyneuropathy associated with type 2 diabetes mellitus ([place]) 06/28/2018   Venous insufficiency of both lower extremities 06/28/2018   S/P craniotomy 09/01/2016   Meningioma ([place]) 09/01/2016   Trauma    Diabetes mellitus type II, controlled ([place])    Essential hypertension    Acute encephalopathy 04/09/2014   Lactic acidosis 04/09/2014   Hyperammonemia ([place]) 04/09/2014    Past Surgical History:  Procedure Laterality Date   BIOPSY  09/05/2020   Procedure: BIOPSY;  Surgeon: Irene Shipper, MD;  Location: [facility];  Service: Endoscopy;;   CARDIAC CATHETERIZATION  7 yrs ago   COLONOSCOPY WITH PROPOFOL N/A 09/05/2020  Procedure: COLONOSCOPY WITH PROPOFOL;  Surgeon: Irene Shipper, MD;  Location: [facility] ENDOSCOPY;  Service: Endoscopy;  Laterality: N/A;   CRANIOTOMY N/A 09/01/2016   Procedure: CRANIOTOMY TUMOR  LEFT PTERIONAL;  Surgeon: Ashok Pall, MD;  Location: [place];  Service: Neurosurgery;  Laterality: N/A;   cyst removed from chest      as a child   NO PAST SURGERIES     PARTIAL COLECTOMY N/A 09/10/2020   Procedure: TRANSVERSE COLECTOMY;  Surgeon: [name], MD;  Location: [place];  Service: General;  Laterality: N/A;   POLYPECTOMY  09/05/2020   Procedure: POLYPECTOMY;  Surgeon: Irene Shipper, MD;  Location: [facility] ENDOSCOPY;  Service: Endoscopy;;   PORTACATH PLACEMENT Right 10/01/2020   Procedure:  INSERTION PORT-A-CATH;  Surgeon: [name], MD;  Location: [place];  Service: General;  Laterality: Right;   SUBMUCOSAL TATTOO INJECTION  09/05/2020   Procedure: SUBMUCOSAL TATTOO INJECTION;  Surgeon: Irene Shipper, MD;  Location: [facility] ENDOSCOPY;  Service: Endoscopy;;       Family History  Problem Relation Age of Onset   Breast cancer Mother    Breast cancer Maternal Aunt    Prostate cancer Maternal Uncle     Social History   Tobacco Use   Smoking status: Former   Smokeless tobacco: Never   Tobacco comments:    quit smoking 35 yrs ago  Vaping Use   Vaping Use: Never used  Substance Use Topics   Alcohol use: No   Drug use: No    Home Medications Prior to Admission medications   Medication Sig Start Date End Date Taking? Authorizing Provider  acetaminophen (TYLENOL) 500 MG tablet Take 1,000 mg by mouth 3 (three) times daily as needed for headache (pain).    [provider]  albuterol (PROVENTIL HFA;VENTOLIN HFA) 108 (90 BASE) MCG/ACT inhaler Inhale 2 puffs into the lungs every 4 (four) hours as needed for wheezing or shortness of breath. Patient not taking: No sig reported 04/09/15   Muthersbaugh, Jarrett Soho, PA-C  amLODipine (NORVASC) 10 MG tablet Take 10 mg by mouth daily. 05/11/20   [provider]  atorvastatin (LIPITOR) 20 MG tablet Take 20 mg by mouth daily at 6 PM.  Patient not taking: Reported on 10/08/2020    [provider]  capecitabine (XELODA) 500 MG tablet Take 3 tablets (1,500 mg total) by mouth 2 (two) times daily after a meal. 11/19/20   Ladell Pier, MD  gabapentin (NEURONTIN) 300 MG capsule Take 1 capsule (300 mg total) by mouth 3 (three) times daily. 04/06/16   Varney Biles, MD  glimepiride (AMARYL) 4 MG tablet Take 4 mg by mouth daily. Patient not taking: Reported on 11/19/2020 11/14/20   [provider]  levETIRAcetam (KEPPRA) 1000 MG tablet Take 1,000 mg by mouth 2 (two) times daily. 07/28/16   [provider]   lidocaine-prilocaine (EMLA) cream Apply 1 application topically as directed. Apply 1/2 tablespoon to port site 2 hours prior to stick and cover with plastic wrap to numb site Patient not taking: Reported on 10/08/2020 09/24/20   Ladell Pier, MD  metFORMIN (GLUCOPHAGE-XR) 500 MG 24 hr tablet Take 500 mg by mouth 2 (two) times daily. 07/27/20   [provider]  omeprazole (PRILOSEC) 20 MG capsule Take 20 mg by mouth daily. 08/10/16   [provider]  ondansetron (ZOFRAN) 8 MG tablet Take 1 tablet (8 mg total) by mouth every 8 (eight) hours as needed for nausea or vomiting. Patient not taking:  No sig reported 09/24/20   Ladell Pier, MD  prochlorperazine (COMPAZINE) 10 MG tablet Take 1 tablet (10 mg total) by mouth every 6 (six) hours as needed. Patient not taking: No sig reported 09/24/20   Ladell Pier, MD    Allergies    Patient has no known allergies.  Review of Systems   Review of Systems  Constitutional:  Positive for appetite change. Negative for fever.  HENT:  Negative for sore throat.   Eyes:  Negative for redness.  Respiratory:  Negative for cough and shortness of breath.   Cardiovascular:  Negative for chest pain.  Gastrointestinal:  Negative for abdominal pain, diarrhea and vomiting.  Genitourinary:  Negative for dysuria and flank pain.  Musculoskeletal:  Negative for back pain and neck pain.  Skin:  Negative for rash.  Neurological:  Positive for weakness and light-headedness. Negative for headaches.  Hematological:  Does not bruise/bleed easily.  Psychiatric/Behavioral:  Negative for confusion.    Physical Exam Updated Vital Signs BP 118/78 (BP Location: Right Arm)   Pulse 100   Temp 97.6 F (36.4 C) (Oral)   Resp 19   Ht 1.727 m (5\' 8" )   Wt 111.1 kg   SpO2 100%   BMI 37.25 kg/m   Physical Exam Vitals and nursing note reviewed.  Constitutional:      Appearance: Normal appearance. He is well-developed.  HENT:     Head: Atraumatic.      Nose: Nose normal.     Mouth/Throat:     Mouth: Mucous membranes are moist.     Pharynx: Oropharynx is clear.  Eyes:     General: No scleral icterus.    Conjunctiva/sclera: Conjunctivae normal.     Pupils: Pupils are equal, round, and reactive to light.  Neck:     Trachea: No tracheal deviation.  Cardiovascular:     Rate and Rhythm: Normal rate and regular rhythm.     Pulses: Normal pulses.     Heart sounds: Normal heart sounds. No murmur heard.   No friction rub. No gallop.  Pulmonary:     Effort: Pulmonary effort is normal. No accessory muscle usage or respiratory distress.     Breath sounds: Normal breath sounds.     Comments: Port right chest without sign of infection.  Abdominal:     General: Bowel sounds are normal. There is no distension.     Palpations: Abdomen is soft.     Tenderness: There is no abdominal tenderness. There is no guarding.     Comments: Ostomy patent/functioning, full of liquid stool  Genitourinary:    Comments: No cva tenderness. Musculoskeletal:        General: No swelling or tenderness.     Cervical back: Normal range of motion and neck supple. No rigidity.     Right lower leg: No edema.     Left lower leg: No edema.     Comments: CTLS spine, non tender, aligned, no step off. Good rom bil extremities without pain or focal bony tenderness.   Skin:    General: Skin is warm and dry.     Findings: No rash.  Neurological:     Mental Status: He is alert.     Comments: Alert, speech clear. GCS 15. Motor/sens grossly intact bil.   Psychiatric:        Mood and Affect: Mood normal.    ED Results / Procedures / Treatments   Labs (all labs ordered are listed, but only abnormal results  are displayed) Results for orders placed or performed during the hospital encounter of 11/27/20  CBC  Result Value Ref Range   WBC 5.4 4.0 - 10.5 K/uL   RBC 4.47 4.22 - 5.81 MIL/uL   Hemoglobin 11.6 (L) 13.0 - 17.0 g/dL   HCT 32.9 (L) 39.0 - 52.0 %   MCV 73.6 (L)  80.0 - 100.0 fL   MCH 26.0 26.0 - 34.0 pg   MCHC 35.3 30.0 - 36.0 g/dL   RDW 19.9 (H) 11.5 - 15.5 %   Platelets 202 150 - 400 K/uL   nRBC 0.0 0.0 - 0.2 %  Comprehensive metabolic panel  Result Value Ref Range   Sodium 131 (L) 135 - 145 mmol/L   Potassium 5.3 (H) 3.5 - 5.1 mmol/L   Chloride 101 98 - 111 mmol/L   CO2 16 (L) 22 - 32 mmol/L   Glucose, Bld 262 (H) 70 - 99 mg/dL   BUN 52 (H) 8 - 23 mg/dL   Creatinine, Ser 1.60 (H) 0.61 - 1.24 mg/dL   Calcium 8.8 (L) 8.9 - 10.3 mg/dL   Total Protein 6.7 6.5 - 8.1 g/dL   Albumin 3.7 3.5 - 5.0 g/dL   AST 17 15 - 41 U/L   ALT 18 0 - 44 U/L   Alkaline Phosphatase 72 38 - 126 U/L   Total Bilirubin 1.1 0.3 - 1.2 mg/dL   GFR, Estimated 48 (L) >60 mL/min   Anion gap 14 5 - 15   DG Chest Portable 1 View  Result Date: 11/15/2020 CLINICAL DATA:  Fatigue, colon cancer EXAM: PORTABLE CHEST 1 VIEW COMPARISON:  10/01/2020 FINDINGS: Right Port-A-Cath remains in place, unchanged. Heart and mediastinal contours are within normal limits. No focal opacities or effusions. No acute bony abnormality. IMPRESSION: No active disease. Electronically Signed   By: Rolm Baptise M.D.   On: 11/15/2020 14:34    ED ECG REPORT   Date: 11/28/2020  Rate: 101  Rhythm: sinus tachycardia  QRS Axis: normal  Intervals: normal  ST/T Wave abnormalities: normal  Conduction Disutrbances:none  Narrative Interpretation:   Old EKG Reviewed: unchanged  I have personally reviewed the EKG tracing   Radiology DG Chest Port 1 View  Result Date: 11/27/2020 CLINICAL DATA:  Weakness.  Fall today. EXAM: PORTABLE CHEST 1 VIEW COMPARISON:  Radiograph 11/15/2020 FINDINGS: Right chest port unchanged in position. Normal heart size and mediastinal contours. No acute or focal airspace disease no pleural fluid, pulmonary edema, or pneumothorax. No acute osseous abnormalities are seen. IMPRESSION: No acute chest findings. Electronically Signed   By: Keith Rake M.D.   On: 11/27/2020 22:09     Procedures Procedures   Medications Ordered in ED Medications  sodium chloride 0.9 % bolus 1,000 mL (has no administration in time range)    ED Course  I have reviewed the triage vital signs and the nursing notes.  Pertinent labs & imaging results that were available during my care of the patient were reviewed by me and considered in my medical decision making (see chart for details).    MDM Rules/Calculators/A&P                           Iv ns bolus. Labs sent.   Reviewed nursing notes and prior charts for additional history.   Labs reviewed/interpreted by me - bun/cr mildly increased c/w dehydration.  AG normal.   Additional litler IVF given.   Po fluids.  Pt reports feeling  much improved/ready for d/c.   No faintness or dizziness. No nv. No pain or other c/o.    Recheck, hr 88, rr 16, pulse ox 100% room air. Pt afebrile.   Patient currently appears stable for d/c.   Rec close pcp/onc f/u.  Return precautions provided.      Final Clinical Impression(s) / ED Diagnoses Final diagnoses:  None    Rx / DC Orders ED Discharge Orders     None        Lajean Saver, MD 11/28/20 [phone]

## 2020-11-28 NOTE — Discharge Instructions (Addendum)
It was our pleasure to provide your ER care today - we hope that you feel better.  Rest. Drink plenty of fluids/make sure to stay well hydrated. Fall precautions.   Follow up with your doctor in the next 3-4 days.  Return to ER if worse, new symptoms, fevers, new or severe pain, persistent vomiting, trouble breathing, weak/fainting, or other concern.

## 2020-11-30 ENCOUNTER — Other Ambulatory Visit: Payer: Self-pay

## 2020-11-30 ENCOUNTER — Emergency Department (HOSPITAL_COMMUNITY): Payer: Medicare HMO

## 2020-11-30 ENCOUNTER — Inpatient Hospital Stay (HOSPITAL_COMMUNITY)
Admission: EM | Admit: 2020-11-30 | Discharge: 2021-03-13 | DRG: 003 | Disposition: A | Discharge status: Skilled Nursing Facility | Payer: Medicare HMO | Attending: Internal Medicine | Admitting: Internal Medicine

## 2020-11-30 DIAGNOSIS — G8929 Other chronic pain: Secondary | ICD-10-CM | POA: Diagnosis present

## 2020-11-30 DIAGNOSIS — G7281 Critical illness myopathy: Secondary | ICD-10-CM | POA: Diagnosis not present

## 2020-11-30 DIAGNOSIS — Z992 Dependence on renal dialysis: Secondary | ICD-10-CM

## 2020-11-30 DIAGNOSIS — R140 Abdominal distension (gaseous): Secondary | ICD-10-CM

## 2020-11-30 DIAGNOSIS — K56600 Partial intestinal obstruction, unspecified as to cause: Secondary | ICD-10-CM | POA: Diagnosis not present

## 2020-11-30 DIAGNOSIS — F419 Anxiety disorder, unspecified: Secondary | ICD-10-CM | POA: Diagnosis present

## 2020-11-30 DIAGNOSIS — G9349 Other encephalopathy: Secondary | ICD-10-CM | POA: Diagnosis not present

## 2020-11-30 DIAGNOSIS — E1142 Type 2 diabetes mellitus with diabetic polyneuropathy: Secondary | ICD-10-CM | POA: Diagnosis present

## 2020-11-30 DIAGNOSIS — F05 Delirium due to known physiological condition: Secondary | ICD-10-CM | POA: Diagnosis not present

## 2020-11-30 DIAGNOSIS — I130 Hypertensive heart and chronic kidney disease with heart failure and stage 1 through stage 4 chronic kidney disease, or unspecified chronic kidney disease: Secondary | ICD-10-CM | POA: Diagnosis present

## 2020-11-30 DIAGNOSIS — R34 Anuria and oliguria: Secondary | ICD-10-CM | POA: Diagnosis not present

## 2020-11-30 DIAGNOSIS — K521 Toxic gastroenteritis and colitis: Secondary | ICD-10-CM | POA: Diagnosis present

## 2020-11-30 DIAGNOSIS — J95851 Ventilator associated pneumonia: Secondary | ICD-10-CM | POA: Diagnosis not present

## 2020-11-30 DIAGNOSIS — T17908A Unspecified foreign body in respiratory tract, part unspecified causing other injury, initial encounter: Secondary | ICD-10-CM

## 2020-11-30 DIAGNOSIS — E1121 Type 2 diabetes mellitus with diabetic nephropathy: Secondary | ICD-10-CM

## 2020-11-30 DIAGNOSIS — Z781 Physical restraint status: Secondary | ICD-10-CM

## 2020-11-30 DIAGNOSIS — K1230 Oral mucositis (ulcerative), unspecified: Secondary | ICD-10-CM | POA: Diagnosis not present

## 2020-11-30 DIAGNOSIS — E872 Acidosis, unspecified: Secondary | ICD-10-CM | POA: Diagnosis present

## 2020-11-30 DIAGNOSIS — K209 Esophagitis, unspecified without bleeding: Secondary | ICD-10-CM

## 2020-11-30 DIAGNOSIS — C188 Malignant neoplasm of overlapping sites of colon: Secondary | ICD-10-CM | POA: Diagnosis present

## 2020-11-30 DIAGNOSIS — Z87891 Personal history of nicotine dependence: Secondary | ICD-10-CM

## 2020-11-30 DIAGNOSIS — R1312 Dysphagia, oropharyngeal phase: Secondary | ICD-10-CM

## 2020-11-30 DIAGNOSIS — E871 Hypo-osmolality and hyponatremia: Secondary | ICD-10-CM | POA: Diagnosis not present

## 2020-11-30 DIAGNOSIS — G4733 Obstructive sleep apnea (adult) (pediatric): Secondary | ICD-10-CM | POA: Diagnosis present

## 2020-11-30 DIAGNOSIS — C184 Malignant neoplasm of transverse colon: Secondary | ICD-10-CM

## 2020-11-30 DIAGNOSIS — K567 Ileus, unspecified: Secondary | ICD-10-CM | POA: Diagnosis not present

## 2020-11-30 DIAGNOSIS — A419 Sepsis, unspecified organism: Secondary | ICD-10-CM | POA: Diagnosis present

## 2020-11-30 DIAGNOSIS — R54 Age-related physical debility: Secondary | ICD-10-CM | POA: Diagnosis present

## 2020-11-30 DIAGNOSIS — K559 Vascular disorder of intestine, unspecified: Secondary | ICD-10-CM | POA: Diagnosis not present

## 2020-11-30 DIAGNOSIS — N179 Acute kidney failure, unspecified: Secondary | ICD-10-CM

## 2020-11-30 DIAGNOSIS — R0600 Dyspnea, unspecified: Secondary | ICD-10-CM

## 2020-11-30 DIAGNOSIS — I4891 Unspecified atrial fibrillation: Secondary | ICD-10-CM

## 2020-11-30 DIAGNOSIS — I82411 Acute embolism and thrombosis of right femoral vein: Secondary | ICD-10-CM | POA: Diagnosis not present

## 2020-11-30 DIAGNOSIS — I824Y1 Acute embolism and thrombosis of unspecified deep veins of right proximal lower extremity: Secondary | ICD-10-CM | POA: Diagnosis not present

## 2020-11-30 DIAGNOSIS — Z515 Encounter for palliative care: Secondary | ICD-10-CM

## 2020-11-30 DIAGNOSIS — Z933 Colostomy status: Secondary | ICD-10-CM

## 2020-11-30 DIAGNOSIS — K2100 Gastro-esophageal reflux disease with esophagitis, without bleeding: Secondary | ICD-10-CM | POA: Diagnosis present

## 2020-11-30 DIAGNOSIS — Z8673 Personal history of transient ischemic attack (TIA), and cerebral infarction without residual deficits: Secondary | ICD-10-CM

## 2020-11-30 DIAGNOSIS — I480 Paroxysmal atrial fibrillation: Secondary | ICD-10-CM | POA: Diagnosis present

## 2020-11-30 DIAGNOSIS — I6340 Cerebral infarction due to embolism of unspecified cerebral artery: Secondary | ICD-10-CM | POA: Insufficient documentation

## 2020-11-30 DIAGNOSIS — R079 Chest pain, unspecified: Secondary | ICD-10-CM

## 2020-11-30 DIAGNOSIS — G629 Polyneuropathy, unspecified: Secondary | ICD-10-CM

## 2020-11-30 DIAGNOSIS — Z8042 Family history of malignant neoplasm of prostate: Secondary | ICD-10-CM

## 2020-11-30 DIAGNOSIS — Z9115 Patient's noncompliance with renal dialysis: Secondary | ICD-10-CM

## 2020-11-30 DIAGNOSIS — L8990 Pressure ulcer of unspecified site, unspecified stage: Secondary | ICD-10-CM | POA: Insufficient documentation

## 2020-11-30 DIAGNOSIS — L89322 Pressure ulcer of left buttock, stage 2: Secondary | ICD-10-CM | POA: Diagnosis not present

## 2020-11-30 DIAGNOSIS — A4152 Sepsis due to Pseudomonas: Secondary | ICD-10-CM | POA: Diagnosis not present

## 2020-11-30 DIAGNOSIS — R579 Shock, unspecified: Secondary | ICD-10-CM | POA: Clinically undetermined

## 2020-11-30 DIAGNOSIS — J189 Pneumonia, unspecified organism: Secondary | ICD-10-CM

## 2020-11-30 DIAGNOSIS — E1122 Type 2 diabetes mellitus with diabetic chronic kidney disease: Secondary | ICD-10-CM | POA: Diagnosis present

## 2020-11-30 DIAGNOSIS — B370 Candidal stomatitis: Secondary | ICD-10-CM | POA: Diagnosis not present

## 2020-11-30 DIAGNOSIS — E440 Moderate protein-calorie malnutrition: Secondary | ICD-10-CM | POA: Diagnosis present

## 2020-11-30 DIAGNOSIS — T451X5A Adverse effect of antineoplastic and immunosuppressive drugs, initial encounter: Secondary | ICD-10-CM | POA: Diagnosis present

## 2020-11-30 DIAGNOSIS — Y848 Other medical procedures as the cause of abnormal reaction of the patient, or of later complication, without mention of misadventure at the time of the procedure: Secondary | ICD-10-CM | POA: Diagnosis not present

## 2020-11-30 DIAGNOSIS — N171 Acute kidney failure with acute cortical necrosis: Secondary | ICD-10-CM | POA: Diagnosis present

## 2020-11-30 DIAGNOSIS — Z86011 Personal history of benign neoplasm of the brain: Secondary | ICD-10-CM

## 2020-11-30 DIAGNOSIS — Z930 Tracheostomy status: Secondary | ICD-10-CM

## 2020-11-30 DIAGNOSIS — N2581 Secondary hyperparathyroidism of renal origin: Secondary | ICD-10-CM | POA: Diagnosis present

## 2020-11-30 DIAGNOSIS — J9600 Acute respiratory failure, unspecified whether with hypoxia or hypercapnia: Secondary | ICD-10-CM

## 2020-11-30 DIAGNOSIS — Z79899 Other long term (current) drug therapy: Secondary | ICD-10-CM

## 2020-11-30 DIAGNOSIS — K269 Duodenal ulcer, unspecified as acute or chronic, without hemorrhage or perforation: Secondary | ICD-10-CM | POA: Diagnosis not present

## 2020-11-30 DIAGNOSIS — K56609 Unspecified intestinal obstruction, unspecified as to partial versus complete obstruction: Secondary | ICD-10-CM

## 2020-11-30 DIAGNOSIS — I5032 Chronic diastolic (congestive) heart failure: Secondary | ICD-10-CM | POA: Diagnosis present

## 2020-11-30 DIAGNOSIS — Z978 Presence of other specified devices: Secondary | ICD-10-CM

## 2020-11-30 DIAGNOSIS — D84821 Immunodeficiency due to drugs: Secondary | ICD-10-CM | POA: Diagnosis present

## 2020-11-30 DIAGNOSIS — R109 Unspecified abdominal pain: Secondary | ICD-10-CM

## 2020-11-30 DIAGNOSIS — D6959 Other secondary thrombocytopenia: Secondary | ICD-10-CM | POA: Diagnosis present

## 2020-11-30 DIAGNOSIS — R000 Tachycardia, unspecified: Secondary | ICD-10-CM | POA: Diagnosis present

## 2020-11-30 DIAGNOSIS — E785 Hyperlipidemia, unspecified: Secondary | ICD-10-CM | POA: Diagnosis present

## 2020-11-30 DIAGNOSIS — J9621 Acute and chronic respiratory failure with hypoxia: Secondary | ICD-10-CM | POA: Diagnosis not present

## 2020-11-30 DIAGNOSIS — E669 Obesity, unspecified: Secondary | ICD-10-CM | POA: Diagnosis present

## 2020-11-30 DIAGNOSIS — R0682 Tachypnea, not elsewhere classified: Secondary | ICD-10-CM

## 2020-11-30 DIAGNOSIS — Z23 Encounter for immunization: Secondary | ICD-10-CM

## 2020-11-30 DIAGNOSIS — I350 Nonrheumatic aortic (valve) stenosis: Secondary | ICD-10-CM | POA: Diagnosis present

## 2020-11-30 DIAGNOSIS — L89152 Pressure ulcer of sacral region, stage 2: Secondary | ICD-10-CM | POA: Diagnosis present

## 2020-11-30 DIAGNOSIS — R0602 Shortness of breath: Secondary | ICD-10-CM

## 2020-11-30 DIAGNOSIS — M898X9 Other specified disorders of bone, unspecified site: Secondary | ICD-10-CM | POA: Diagnosis present

## 2020-11-30 DIAGNOSIS — G40909 Epilepsy, unspecified, not intractable, without status epilepticus: Secondary | ICD-10-CM | POA: Diagnosis present

## 2020-11-30 DIAGNOSIS — Z803 Family history of malignant neoplasm of breast: Secondary | ICD-10-CM

## 2020-11-30 DIAGNOSIS — D509 Iron deficiency anemia, unspecified: Secondary | ICD-10-CM | POA: Diagnosis present

## 2020-11-30 DIAGNOSIS — E1100 Type 2 diabetes mellitus with hyperosmolarity without nonketotic hyperglycemic-hyperosmolar coma (NKHHC): Principal | ICD-10-CM | POA: Diagnosis present

## 2020-11-30 DIAGNOSIS — Z01818 Encounter for other preprocedural examination: Secondary | ICD-10-CM

## 2020-11-30 DIAGNOSIS — R627 Adult failure to thrive: Secondary | ICD-10-CM | POA: Diagnosis not present

## 2020-11-30 DIAGNOSIS — J151 Pneumonia due to Pseudomonas: Secondary | ICD-10-CM | POA: Diagnosis not present

## 2020-11-30 DIAGNOSIS — T17998A Other foreign object in respiratory tract, part unspecified causing other injury, initial encounter: Secondary | ICD-10-CM | POA: Diagnosis not present

## 2020-11-30 DIAGNOSIS — I82431 Acute embolism and thrombosis of right popliteal vein: Secondary | ICD-10-CM | POA: Diagnosis not present

## 2020-11-30 DIAGNOSIS — F13239 Sedative, hypnotic or anxiolytic dependence with withdrawal, unspecified: Secondary | ICD-10-CM | POA: Diagnosis not present

## 2020-11-30 DIAGNOSIS — J9503 Malfunction of tracheostomy stoma: Secondary | ICD-10-CM | POA: Diagnosis not present

## 2020-11-30 DIAGNOSIS — J690 Pneumonitis due to inhalation of food and vomit: Secondary | ICD-10-CM

## 2020-11-30 DIAGNOSIS — Z86718 Personal history of other venous thrombosis and embolism: Secondary | ICD-10-CM

## 2020-11-30 DIAGNOSIS — Z20822 Contact with and (suspected) exposure to covid-19: Secondary | ICD-10-CM | POA: Diagnosis present

## 2020-11-30 DIAGNOSIS — N170 Acute kidney failure with tubular necrosis: Secondary | ICD-10-CM | POA: Diagnosis present

## 2020-11-30 DIAGNOSIS — E8770 Fluid overload, unspecified: Secondary | ICD-10-CM | POA: Diagnosis not present

## 2020-11-30 DIAGNOSIS — R0902 Hypoxemia: Secondary | ICD-10-CM

## 2020-11-30 DIAGNOSIS — I953 Hypotension of hemodialysis: Secondary | ICD-10-CM | POA: Diagnosis not present

## 2020-11-30 DIAGNOSIS — Z9911 Dependence on respirator [ventilator] status: Secondary | ICD-10-CM

## 2020-11-30 DIAGNOSIS — N182 Chronic kidney disease, stage 2 (mild): Secondary | ICD-10-CM

## 2020-11-30 DIAGNOSIS — B3749 Other urogenital candidiasis: Secondary | ICD-10-CM | POA: Diagnosis not present

## 2020-11-30 DIAGNOSIS — R6521 Severe sepsis with septic shock: Secondary | ICD-10-CM | POA: Diagnosis not present

## 2020-11-30 DIAGNOSIS — G9341 Metabolic encephalopathy: Secondary | ICD-10-CM | POA: Diagnosis not present

## 2020-11-30 DIAGNOSIS — I248 Other forms of acute ischemic heart disease: Secondary | ICD-10-CM | POA: Diagnosis not present

## 2020-11-30 DIAGNOSIS — R1013 Epigastric pain: Secondary | ICD-10-CM

## 2020-11-30 DIAGNOSIS — R4781 Slurred speech: Secondary | ICD-10-CM | POA: Diagnosis present

## 2020-11-30 DIAGNOSIS — Z4659 Encounter for fitting and adjustment of other gastrointestinal appliance and device: Secondary | ICD-10-CM

## 2020-11-30 DIAGNOSIS — N184 Chronic kidney disease, stage 4 (severe): Secondary | ICD-10-CM | POA: Diagnosis present

## 2020-11-30 DIAGNOSIS — Z931 Gastrostomy status: Secondary | ICD-10-CM

## 2020-11-30 DIAGNOSIS — E876 Hypokalemia: Secondary | ICD-10-CM | POA: Diagnosis not present

## 2020-11-30 DIAGNOSIS — I700 Atherosclerosis of aorta: Secondary | ICD-10-CM | POA: Diagnosis present

## 2020-11-30 DIAGNOSIS — D72829 Elevated white blood cell count, unspecified: Secondary | ICD-10-CM

## 2020-11-30 DIAGNOSIS — Z7401 Bed confinement status: Secondary | ICD-10-CM

## 2020-11-30 DIAGNOSIS — J9601 Acute respiratory failure with hypoxia: Secondary | ICD-10-CM | POA: Diagnosis present

## 2020-11-30 DIAGNOSIS — D62 Acute posthemorrhagic anemia: Secondary | ICD-10-CM | POA: Diagnosis present

## 2020-11-30 DIAGNOSIS — J9690 Respiratory failure, unspecified, unspecified whether with hypoxia or hypercapnia: Secondary | ICD-10-CM

## 2020-11-30 DIAGNOSIS — E875 Hyperkalemia: Secondary | ICD-10-CM | POA: Diagnosis not present

## 2020-11-30 DIAGNOSIS — R296 Repeated falls: Secondary | ICD-10-CM | POA: Diagnosis present

## 2020-11-30 DIAGNOSIS — I10 Essential (primary) hypertension: Secondary | ICD-10-CM

## 2020-11-30 DIAGNOSIS — R1110 Vomiting, unspecified: Secondary | ICD-10-CM

## 2020-11-30 DIAGNOSIS — E860 Dehydration: Secondary | ICD-10-CM | POA: Diagnosis present

## 2020-11-30 DIAGNOSIS — Z7984 Long term (current) use of oral hypoglycemic drugs: Secondary | ICD-10-CM

## 2020-11-30 DIAGNOSIS — I639 Cerebral infarction, unspecified: Secondary | ICD-10-CM | POA: Diagnosis not present

## 2020-11-30 DIAGNOSIS — Z6839 Body mass index (BMI) 39.0-39.9, adult: Secondary | ICD-10-CM

## 2020-11-30 DIAGNOSIS — D631 Anemia in chronic kidney disease: Secondary | ICD-10-CM | POA: Diagnosis present

## 2020-11-30 DIAGNOSIS — L89816 Pressure-induced deep tissue damage of head: Secondary | ICD-10-CM | POA: Diagnosis not present

## 2020-11-30 DIAGNOSIS — B371 Pulmonary candidiasis: Secondary | ICD-10-CM | POA: Diagnosis present

## 2020-11-30 LAB — COMPREHENSIVE METABOLIC PANEL
ALT: 29 U/L (ref 0–44)
AST: 26 U/L (ref 15–41)
Albumin: 3.3 g/dL — ABNORMAL LOW (ref 3.5–5.0)
Alkaline Phosphatase: 67 U/L (ref 38–126)
Anion gap: 16 — ABNORMAL HIGH (ref 5–15)
BUN: 71 mg/dL — ABNORMAL HIGH (ref 8–23)
CO2: 13 mmol/L — ABNORMAL LOW (ref 22–32)
Calcium: 9.2 mg/dL (ref 8.9–10.3)
Chloride: 104 mmol/L (ref 98–111)
Creatinine, Ser: 2.93 mg/dL — ABNORMAL HIGH (ref 0.61–1.24)
GFR, Estimated: 23 mL/min — ABNORMAL LOW (ref >60)
Glucose, Bld: 410 mg/dL — ABNORMAL HIGH (ref 70–99)
Potassium: 5.9 mmol/L — ABNORMAL HIGH (ref 3.5–5.1)
Sodium: 133 mmol/L — ABNORMAL LOW (ref 135–145)
Total Bilirubin: 1.4 mg/dL — ABNORMAL HIGH (ref 0.3–1.2)
Total Protein: 6.8 g/dL (ref 6.5–8.1)

## 2020-11-30 LAB — CBC WITH DIFFERENTIAL/PLATELET
Band Neutrophils: 10 %
Basophils Relative: 1 %
Blasts: NONE SEEN %
Eosinophils Relative: 1 %
HCT: 38.4 % — ABNORMAL LOW (ref 39.0–52.0)
Hemoglobin: 13.6 g/dL (ref 13.0–17.0)
Lymphocytes Relative: 32 %
MCH: 26.6 pg (ref 26.0–34.0)
MCHC: 35.4 g/dL (ref 30.0–36.0)
MCV: 75.1 fL — ABNORMAL LOW (ref 80.0–100.0)
Metamyelocytes Relative: NONE SEEN %
Monocytes Relative: 17 %
Myelocytes: NONE SEEN %
Neutrophils Relative %: 39 %
Platelets: 265 10*3/uL (ref 150–400)
Promyelocytes Relative: NONE SEEN %
RBC Morphology: NORMAL
RBC: 5.11 MIL/uL (ref 4.22–5.81)
RDW: 21 % — ABNORMAL HIGH (ref 11.5–15.5)
WBC Morphology: NORMAL
WBC: 4.8 10*3/uL (ref 4.0–10.5)
nRBC: 0.6 % — ABNORMAL HIGH (ref 0.0–0.2)
nRBC: 3 /100 WBC — ABNORMAL HIGH

## 2020-11-30 LAB — RESP PANEL BY RT-PCR (FLU A&B, COVID) ARPGX2
Influenza A by PCR: NEGATIVE
Influenza B by PCR: NEGATIVE
SARS Coronavirus 2 by RT PCR: NEGATIVE

## 2020-11-30 LAB — LACTIC ACID, PLASMA: Lactic Acid, Venous: 6.6 mmol/L (ref 0.5–1.9)

## 2020-11-30 LAB — PROTIME-INR
INR: 1.5 — ABNORMAL HIGH (ref 0.8–1.2)
Prothrombin Time: 17.7 seconds — ABNORMAL HIGH (ref 11.4–15.2)

## 2020-11-30 LAB — TROPONIN I (HIGH SENSITIVITY): Troponin I (High Sensitivity): 23 ng/L — ABNORMAL HIGH (ref <18)

## 2020-11-30 LAB — APTT: aPTT: 22 seconds — ABNORMAL LOW (ref 24–36)

## 2020-11-30 MED ORDER — LACTATED RINGERS IV BOLUS (SEPSIS)
1000.0000 mL | Freq: Once | INTRAVENOUS | Status: AC
  Administered 2020-12-01: 1000 mL via INTRAVENOUS

## 2020-11-30 MED ORDER — ONDANSETRON HCL 4 MG/2ML IJ SOLN
4.0000 mg | Freq: Once | INTRAMUSCULAR | Status: AC
  Administered 2020-11-30: 4 mg via INTRAVENOUS
  Filled 2020-11-30: qty 2

## 2020-11-30 MED ORDER — LACTATED RINGERS IV BOLUS (SEPSIS)
1000.0000 mL | Freq: Once | INTRAVENOUS | Status: AC
  Administered 2020-11-30: 1000 mL via INTRAVENOUS

## 2020-11-30 MED ORDER — LACTATED RINGERS IV BOLUS (SEPSIS)
1000.0000 mL | Freq: Once | INTRAVENOUS | Status: AC
Start: 2020-11-30 — End: 2020-12-01
  Administered 2020-11-30: 1000 mL via INTRAVENOUS

## 2020-11-30 MED ORDER — LACTATED RINGERS IV SOLN: INTRAVENOUS | Status: DC

## 2020-11-30 MED ORDER — FENTANYL CITRATE (PF) 100 MCG/2ML IJ SOLN
100.0000 ug | Freq: Once | INTRAMUSCULAR | Status: AC
  Administered 2020-11-30: 100 ug via INTRAVENOUS
  Filled 2020-11-30: qty 2

## 2020-11-30 MED ORDER — SODIUM CHLORIDE 0.9 % IV SOLN
2.0000 g | INTRAVENOUS | Status: AC
  Administered 2020-11-30: 2 g via INTRAVENOUS
  Filled 2020-11-30 (×3): qty 2

## 2020-11-30 MED ORDER — ACETAMINOPHEN 325 MG PO TABS
650.0000 mg | ORAL_TABLET | Freq: Once | ORAL | Status: AC
  Administered 2020-11-30: 650 mg via ORAL
  Filled 2020-11-30: qty 2

## 2020-11-30 MED ORDER — LACTATED RINGERS IV BOLUS (SEPSIS)
500.0000 mL | Freq: Once | INTRAVENOUS | Status: AC
Start: 2020-12-01 — End: 2020-12-01
  Administered 2020-12-01: 500 mL via INTRAVENOUS

## 2020-11-30 NOTE — ED Triage Notes (Signed)
[facility] from home. Pt began treatment for Colon Cancer 3 mo ago. Pt has been experiencing lack of appetite, severe abdominal pain, and weakness.

## 2020-11-30 NOTE — Progress Notes (Addendum)
A consult was received from an ED physician for Cefepime per pharmacy dosing.  The patient's profile has been reviewed for ht/wt/allergies/indication/available labs.   A one time order has been placed for Cefepime 2gm IV.  Further antibiotics/pharmacy consults should be ordered by admitting physician if indicated.                       Thank you, Netta Cedars PharmD 11/30/2020  10:38 PM   Now also requesting Vancomycin per pharmacy.  Vancomycin 2gm IV x1 ordered.  Netta Cedars, PharmD, BCPS 12/01/2020@2 :56 AM

## 2020-11-30 NOTE — ED Provider Notes (Signed)
[place] DEPT Provider Note   CSN: [id number] Arrival date & time: 11/30/20  2129     History Chief Complaint  Patient presents with   Abdominal Pain    Thomas [name] is a 65 y.o. 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  The patient reports that he has been having intermittent abdominal pain for the last 2 weeks.  Pain became severe tonight.  States that he was seen at [facility] ER for the same on 7/21.  He was treated with IV fluids and discharged home.  He reports associated generalized weakness and lack of appetite.  He has intermittently been having shortness of breath and a cough "on and off for a long time" and intermittent dysuria.  He is unable to characterize the pain.  No known aggravating or alleviating factors.  He denies back or flank pain, rash, vomiting, increased ostomy output, hematuria.    He took Tylenol at 1700 for pain.  He was unaware that he was afebrile until he arrived in the emergency department.  Level 5 caveat secondary to acuity of condition.  The history is provided by the patient and medical records. No language interpreter was used.      Past Medical History:  Diagnosis Date   Acute ischemic left MCA stroke ([place]) 04/09/2014   Acute respiratory failure with hypoxia ([place]) 04/09/2014   Aspiration pneumonia ([place])    Asthma    as a child   Brain tumor ([place])    frontal   Cancer ([place])    Confusion    occasionally   Diabetes mellitus without complication ([place])    takes Metformin daily.   Dizziness    Dyspnea    pt states d/t weight   Epilepsy ([place])    takes Keppra daily   GERD (gastroesophageal reflux disease)    takes Omeprazole  daily   Headache    Hyperlipidemia    takes Atorvastatin daily   Hypertension    takes Lotrel daily   Peripheral edema    takes Lasix daily   Peripheral neuropathy    takes Gabapentin daily   Pneumonia 3 yrs ago   hx of   Seizures (HCC)    Sleep apnea    Urinary frequency     Patient Active Problem List   Diagnosis Date Noted   Tachycardia 12/01/2020   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 ([place]) 12/01/2020   Adverse effect of chemotherapy 12/01/2020   Acute renal failure superimposed on stage 2 chronic kidney disease ([place]) 10/01/2020   Hyponatremia 10/01/2020   Iron deficiency anemia due to chronic blood loss 19/41/7408   Metabolic acidosis 14/48/1856   Colon cancer ([place]) 09/23/2020   Colostomy in place [facility]) 09/11/2020   History of ischemic left MCA stroke 2015 09/11/2020   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    Rectal polyp    Diverticulosis of colon without hemorrhage    Abdominal pain 09/04/2020   Diabetic polyneuropathy associated with type 2 diabetes mellitus ([place]) 06/28/2018   Venous insufficiency of both lower extremities 06/28/2018   S/P craniotomy 09/01/2016   Meningioma ([place]) 09/01/2016   Trauma    Diabetes mellitus with hyperosmolarity without hyperglycemic hyperosmolar nonketotic coma ([place])  Essential hypertension    Acute encephalopathy 04/09/2014   Lactic acidosis 04/09/2014   Hyperammonemia ([place]) 04/09/2014    Past Surgical History:  Procedure Laterality Date   BIOPSY  09/05/2020   Procedure: BIOPSY;  Surgeon: Irene Shipper, MD;  Location: [facility] ENDOSCOPY;  Service: Endoscopy;;   CARDIAC CATHETERIZATION  7 yrs ago   COLONOSCOPY WITH PROPOFOL N/A 09/05/2020   Procedure: COLONOSCOPY WITH PROPOFOL;  Surgeon: Irene Shipper, MD;  Location: [facility] ENDOSCOPY;  Service: Endoscopy;  Laterality: N/A;   CRANIOTOMY N/A 09/01/2016   Procedure: CRANIOTOMY TUMOR  LEFT PTERIONAL;  Surgeon: Ashok Pall,  MD;  Location: [place];  Service: Neurosurgery;  Laterality: N/A;   cyst removed from chest      as a child   NO PAST SURGERIES     PARTIAL COLECTOMY N/A 09/10/2020   Procedure: TRANSVERSE COLECTOMY;  Surgeon: [name], MD;  Location: [place];  Service: General;  Laterality: N/A;   POLYPECTOMY  09/05/2020   Procedure: POLYPECTOMY;  Surgeon: Irene Shipper, MD;  Location: [facility] ENDOSCOPY;  Service: Endoscopy;;   PORTACATH PLACEMENT Right 10/01/2020   Procedure: INSERTION PORT-A-CATH;  Surgeon: [name], MD;  Location: [place];  Service: General;  Laterality: Right;   SUBMUCOSAL TATTOO INJECTION  09/05/2020   Procedure: SUBMUCOSAL TATTOO INJECTION;  Surgeon: Irene Shipper, MD;  Location: [facility] ENDOSCOPY;  Service: Endoscopy;;       Family History  Problem Relation Age of Onset   Breast cancer Mother    Breast cancer Maternal Aunt    Prostate cancer Maternal Uncle     Social History   Tobacco Use   Smoking status: Former   Smokeless tobacco: Never   Tobacco comments:    quit smoking 35 yrs ago  Vaping Use   Vaping Use: Never used  Substance Use Topics   Alcohol use: No   Drug use: No    Home Medications Prior to Admission medications   Medication Sig Start Date End Date Taking? Authorizing Provider  acetaminophen (TYLENOL) 500 MG tablet Take 1,000 mg by mouth 3 (three) times daily as needed for headache (pain).   Yes [provider]  albuterol (PROVENTIL HFA;VENTOLIN HFA) 108 (90 BASE) MCG/ACT inhaler Inhale 2 puffs into the lungs every 4 (four) hours as needed for wheezing or shortness of breath. 04/09/15  Yes Muthersbaugh, Jarrett Soho, PA-C  amLODipine (NORVASC) 10 MG tablet Take 10 mg by mouth daily. 05/11/20  Yes [provider]  capecitabine (XELODA) 500 MG tablet Take 3 tablets (1,500 mg total) by mouth 2 (two) times daily after a meal. 11/19/20  Yes Ladell Pier, MD  gabapentin (NEURONTIN) 300 MG capsule Take 1 capsule (300 mg total) by mouth 3 (three)  times daily. 04/06/16  Yes Varney Biles, MD  levETIRAcetam (KEPPRA) 1000 MG tablet Take 1,000 mg by mouth 2 (two) times daily. 07/28/16  Yes [provider]  metFORMIN (GLUCOPHAGE-XR) 500 MG 24 hr tablet Take 500 mg by mouth 2 (two) times daily. 07/27/20  Yes [provider]  omeprazole (PRILOSEC) 20 MG capsule Take 20 mg by mouth daily. 08/10/16  Yes [provider]  atorvastatin (LIPITOR) 20 MG tablet Take 20 mg by mouth daily at 6 PM.    [provider]  lidocaine-prilocaine (EMLA) cream Apply 1 application topically as directed. Apply 1/2 tablespoon to port site 2 hours prior to stick and cover with plastic wrap to numb site Patient not taking: Reported on 10/08/2020 09/24/20   Benay Spice,  Izola Price, MD  ondansetron (ZOFRAN) 8 MG tablet Take 1 tablet (8 mg total) by mouth every 8 (eight) hours as needed for nausea or vomiting. Patient not taking: No sig reported 09/24/20   Ladell Pier, MD  prochlorperazine (COMPAZINE) 10 MG tablet Take 1 tablet (10 mg total) by mouth every 6 (six) hours as needed. Patient not taking: No sig reported 09/24/20   Ladell Pier, MD    Allergies    Patient has no known allergies.  Review of Systems   Review of Systems  Constitutional:  Positive for appetite change and fever.  HENT:  Negative for congestion and sore throat.   Respiratory:  Positive for cough and shortness of breath.   Cardiovascular:  Positive for chest pain. Negative for leg swelling.  Gastrointestinal:  Positive for abdominal pain. Negative for nausea and vomiting.  Genitourinary:  Positive for dysuria. Negative for hematuria.  Musculoskeletal:  Negative for back pain.  Skin:  Negative for color change, rash and wound.  Allergic/Immunologic: Negative for immunocompromised state.  Neurological:  Positive for weakness. Negative for seizures, syncope, numbness and headaches.  Psychiatric/Behavioral:  Negative for confusion.    Physical Exam Updated Vital  Signs BP 129/70 (BP Location: Left Arm)   Pulse 98   Temp (!) 97.5 F (36.4 C) (Oral)   Resp 18   Ht 5' 8"  (1.727 m)   Wt 111.8 kg   SpO2 98%   BMI 37.48 kg/m   Physical Exam Vitals and nursing note reviewed.  Constitutional:      Appearance: He is well-developed.     Comments: Moaning and writhing around on the bed in pain  HENT:     Head: Normocephalic.  Eyes:     Extraocular Movements: Extraocular movements intact.     Conjunctiva/sclera: Conjunctivae normal.     Pupils: Pupils are equal, round, and reactive to light.  Cardiovascular:     Rate and Rhythm: Regular rhythm. Tachycardia present.     Pulses: Normal pulses.     Heart sounds: No murmur heard.   No friction rub. No gallop.  Pulmonary:     Effort: No respiratory distress.     Breath sounds: No stridor. No wheezing, rhonchi or rales.     Comments: Mild tachypnea.  Lungs are clear to auscultation bilaterally.  No retractions or accessory muscle use. Chest:     Chest wall: No tenderness.  Abdominal:     General: There is no distension.     Palpations: Abdomen is soft. There is no mass.     Tenderness: There is abdominal tenderness. There is guarding. There is no right CVA tenderness, left CVA tenderness or rebound.     Hernia: No hernia is present.     Comments: Diffusely tender palpation throughout the abdomen.  Pain is out of proportion to exam.  Hyperactive bowel sounds in all 4 quadrants.  No CVA tenderness bilaterally.  Negative Murphy sign.  No tenderness over McBurney's point.  Abdomen is not distended.  Unable to visualize stump as ostomy bag is full.  Musculoskeletal:     Cervical back: Neck supple.     Right lower leg: No edema.     Left lower leg: No edema.  Skin:    General: Skin is warm and dry.  Neurological:     Mental Status: He is alert.     Comments: Alert and oriented x3  Psychiatric:        Behavior: Behavior normal.    ED  Results / Procedures / Treatments   Labs (all labs ordered are  listed, but only abnormal results are displayed) Labs Reviewed  LACTIC ACID, PLASMA - Abnormal; Notable for the following components:      Result Value   Lactic Acid, Venous 6.6 (*)    All other components within normal limits  LACTIC ACID, PLASMA - Abnormal; Notable for the following components:   Lactic Acid, Venous 6.8 (*)    All other components within normal limits  COMPREHENSIVE METABOLIC PANEL - Abnormal; Notable for the following components:   Sodium 133 (*)    Potassium 5.9 (*)    CO2 13 (*)    Glucose, Bld 410 (*)    BUN 71 (*)    Creatinine, Ser 2.93 (*)    Albumin 3.3 (*)    Total Bilirubin 1.4 (*)    GFR, Estimated 23 (*)    Anion gap 16 (*)    All other components within normal limits  CBC WITH DIFFERENTIAL/PLATELET - Abnormal; Notable for the following components:   HCT 38.4 (*)    MCV 75.1 (*)    RDW 21.0 (*)    nRBC 0.6 (*)    nRBC 3 (*)    All other components within normal limits  PROTIME-INR - Abnormal; Notable for the following components:   Prothrombin Time 17.7 (*)    INR 1.5 (*)    All other components within normal limits  APTT - Abnormal; Notable for the following components:   aPTT 22 (*)    All other components within normal limits  URINALYSIS, ROUTINE W REFLEX MICROSCOPIC - Abnormal; Notable for the following components:   Glucose, UA >=500 (*)    Protein, ur 100 (*)    Bacteria, UA RARE (*)    All other components within normal limits  BETA-HYDROXYBUTYRIC ACID - Abnormal; Notable for the following components:   Beta-Hydroxybutyric Acid 0.37 (*)    All other components within normal limits  BLOOD GAS, VENOUS - Abnormal; Notable for the following components:   pCO2, Ven 29.4 (*)    Bicarbonate 14.0 (*)    Acid-base deficit 11.0 (*)    All other components within normal limits  LACTIC ACID, PLASMA - Abnormal; Notable for the following components:   Lactic Acid, Venous 5.4 (*)    All other components within normal limits  LACTIC ACID,  PLASMA - Abnormal; Notable for the following components:   Lactic Acid, Venous 4.3 (*)    All other components within normal limits  CBC - Abnormal; Notable for the following components:   Hemoglobin 11.7 (*)    HCT 33.1 (*)    MCV 75.2 (*)    RDW 20.5 (*)    nRBC 0.5 (*)    All other components within normal limits  BASIC METABOLIC PANEL - Abnormal; Notable for the following components:   Sodium 132 (*)    CO2 15 (*)    Glucose, Bld 306 (*)    BUN 58 (*)    Creatinine, Ser 2.12 (*)    Calcium 8.2 (*)    GFR, Estimated 34 (*)    All other components within normal limits  OSMOLALITY - Abnormal; Notable for the following components:   Osmolality 320 (*)    All other components within normal limits  CBG MONITORING, ED - Abnormal; Notable for the following components:   Glucose-Capillary 280 (*)    All other components within normal limits  CBG MONITORING, ED - Abnormal; Notable for the following components:  Glucose-Capillary 238 (*)    All other components within normal limits  CBG MONITORING, ED - Abnormal; Notable for the following components:   Glucose-Capillary 225 (*)    All other components within normal limits  TROPONIN I (HIGH SENSITIVITY) - Abnormal; Notable for the following components:   Troponin I (High Sensitivity) 23 (*)    All other components within normal limits  TROPONIN I (HIGH SENSITIVITY) - Abnormal; Notable for the following components:   Troponin I (High Sensitivity) 18 (*)    All other components within normal limits  RESP PANEL BY RT-PCR (FLU A&B, COVID) ARPGX2  CULTURE, BLOOD (ROUTINE X 2)  CULTURE, BLOOD (ROUTINE X 2)  URINE CULTURE  MRSA NEXT GEN BY PCR, NASAL  URIC ACID  RAPID URINE DRUG SCREEN, HOSP PERFORMED  LIPASE, BLOOD  MAGNESIUM  BLOOD GAS, ARTERIAL  BASIC METABOLIC PANEL  BASIC METABOLIC PANEL  BASIC METABOLIC PANEL  MAGNESIUM  HEMOGLOBIN A1C    EKG EKG Interpretation  Date/Time:  Sunday November 30 2020 22:39:54  EDT Ventricular Rate:  113 PR Interval:  141 QRS Duration: 96 QT Interval:  312 QTC Calculation: 428 R Axis:   103 Text Interpretation: Sinus tachycardia Right axis deviation Probable anteroseptal infarct, old No significant change was found Confirmed by Shanon Rosser [id number]) on 12/01/2020 1:49:14 AM     Radiology DG Chest Port 1 View  Result Date: 11/30/2020 CLINICAL DATA:  Questionable sepsis. Patient began treatment for colon cancer 3 months ago. Loss of appetite, severe abdominal pain, weakness, and back pain. EXAM: PORTABLE CHEST 1 VIEW COMPARISON:  11/27/2020 FINDINGS: Heart size and pulmonary vascularity are normal. Lungs clear. Power port type central venous catheter with tip over the low SVC region. No pneumothorax. No pleural effusions. Mediastinal contours appear intact. IMPRESSION: No active disease. Electronically Signed   By: Lucienne Capers M.D.   On: 11/30/2020 22:36   CT CHEST ABDOMEN PELVIS WO CONTRAST  Result Date: 12/01/2020 CLINICAL DATA:  Abdominal pain and fever EXAM: CT CHEST, ABDOMEN AND PELVIS WITHOUT CONTRAST TECHNIQUE: Multidetector CT imaging of the chest, abdomen and pelvis was performed following the standard protocol without IV contrast. COMPARISON:  09/04/2020 FINDINGS: CT CHEST FINDINGS Cardiovascular: Calcific atherosclerosis of the aorta and coronary arteries. No pericardial effusion. Course and caliber of the aorta are normal. Right chest wall Port-A-Cath tip is at the cavoatrial junction. Mediastinum/Nodes: No enlarged mediastinal, hilar, or axillary lymph nodes. Thyroid gland, trachea, and esophagus demonstrate no significant findings. Lungs/Pleura: Lungs are clear. No pleural effusion or pneumothorax. Musculoskeletal: No chest wall mass or suspicious bone lesions identified. CT ABDOMEN PELVIS FINDINGS Hepatobiliary: No focal liver abnormality is seen. No gallstones, gallbladder wall thickening, or biliary dilatation. Pancreas: Unremarkable. No pancreatic  ductal dilatation or surrounding inflammatory changes. Spleen: Normal in size without focal abnormality. Adrenals/Urinary Tract: Adrenal glands are unremarkable. Kidneys are normal, without renal calculi, focal lesion, or hydronephrosis. Bladder is unremarkable. 3.5 cm left renal cyst. Stomach/Bowel: Normal appearance of the stomach and duodenum. No small bowel dilatation. 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 Status post partial colectomy. Vascular/Lymphatic: Aortic atherosclerosis. No enlarged abdominal or pelvic lymph nodes. Reproductive: Prostate is unremarkable. Other: Bilateral fat containing inguinal hernias. Musculoskeletal: No acute or significant osseous findings. IMPRESSION: 1. No acute abnormality of the chest, abdomen or pelvis. 2. Status post partial colectomy.  No evidence of obstruction. Aortic Atherosclerosis (ICD10-I70.0). Electronically Signed   By: Ulyses Jarred M.D.   On: 12/01/2020 02:20    Procedures .Critical Care  Date/Time: 12/01/2020  8:21 AM Performed by: Joanne Gavel, PA-C Authorized by: Joanne Gavel, PA-C   Critical care provider statement:    Critical care time (minutes):  75   Critical care time was exclusive of:  Separately billable procedures and treating other patients and teaching time   Critical care was necessary to treat or prevent imminent or life-threatening deterioration of the following conditions:  Sepsis   Critical care was time spent personally by me on the following activities:  Blood draw for specimens, development of treatment plan with patient or surrogate, discussions with consultants, evaluation of patient's response to treatment, examination of patient, obtaining history from patient or surrogate, review of old charts, re-evaluation of patient's condition, pulse oximetry, ordering and review of radiographic studies, ordering and review of laboratory studies and ordering and performing treatments and interventions   I assumed direction of  critical care for this patient from another provider in my specialty: no     Care discussed with: admitting provider     Medications Ordered in ED Medications  lactated ringers infusion (0 mLs Intravenous Stopped 12/01/20 0425)  promethazine (PHENERGAN) 25 mg in sodium chloride 0.9 % 50 mL IVPB (has no administration in time range)  diltiazem (CARDIZEM) 1 mg/mL load via infusion 20 mg (20 mg Intravenous Bolus from Bag 12/01/20 0149)    And  diltiazem (CARDIZEM) 125 mg in dextrose 5% 125 mL (1 mg/mL) infusion (10 mg/hr Intravenous Rate/Dose Change 12/01/20 0256)  heparin injection 5,000 Units (5,000 Units Subcutaneous Given 12/01/20 0550)  insulin regular, human (MYXREDLIN) 100 units/ 100 mL infusion (6.5 Units/hr Intravenous Infusion Verify 12/01/20 0648)  lactated ringers infusion ( Intravenous Stopped 12/01/20 0555)  dextrose 5 % in lactated ringers infusion ( Intravenous New Bag/Given 12/01/20 0555)  dextrose 50 % solution 0-50 mL (has no administration in time range)  ceFEPIme (MAXIPIME) 2 g in sodium chloride 0.9 % 100 mL IVPB (has no administration in time range)  fentaNYL (SUBLIMAZE) injection 50 mcg (50 mcg Intravenous Given 12/01/20 0637)  Chlorhexidine Gluconate Cloth 2 % PADS 6 each (has no administration in time range)  fentaNYL (SUBLIMAZE) injection 100 mcg (100 mcg Intravenous Given 11/30/20 2219)  ondansetron (ZOFRAN) injection 4 mg (4 mg Intravenous Given 11/30/20 2222)  acetaminophen (TYLENOL) tablet 650 mg (650 mg Oral Given 11/30/20 2304)  lactated ringers bolus 1,000 mL (0 mLs Intravenous Stopped 12/01/20 0003)    And  lactated ringers bolus 1,000 mL (0 mLs Intravenous Stopped 12/01/20 0047)    And  lactated ringers bolus 1,000 mL (0 mLs Intravenous Stopped 12/01/20 0047)    And  lactated ringers bolus 500 mL (0 mLs Intravenous Stopped 12/01/20 0137)  ceFEPIme (MAXIPIME) 2 g in sodium chloride 0.9 % 100 mL IVPB (0 g Intravenous Stopped 12/01/20 0003)  fentaNYL (SUBLIMAZE) injection  100 mcg (100 mcg Intravenous Given 12/01/20 0021)  fentaNYL (SUBLIMAZE) injection 100 mcg (100 mcg Intravenous Given 12/01/20 0201)  vancomycin (VANCOREADY) IVPB 2000 mg/400 mL (0 mg Intravenous Stopped 12/01/20 0528)  fentaNYL (SUBLIMAZE) injection 100 mcg (100 mcg Intravenous Given 12/01/20 0404)    ED Course  I have reviewed the triage vital signs and the nursing notes.  Pertinent labs & imaging results that were available during my care of the patient were reviewed by me and considered in my medical decision making (see chart for details).    Clinical Course as of 12/01/20 7616  Mon Dec 01, 2020  0143 Repeat EKG after RN noticed acute rate change on the cardiac monitor.  He now appears to have A. fib with RVR.  Discussed the patient with Dr. Florina Ou.  Will order Cardizem bolus and infusion. [MM]  [phone] Discussed with the patient.  He is a full code and is agreeable to surgery if necessitated.  I spoke with Dr. Ninfa Linden, general surgery, who will review the patient's chart and recommends admission to the ICU with critical care.  Spoke with Dr. Carson Myrtle, who will see and evaluate the patient in the ED. 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  Febrile to 101.1 on arrival.  Tachycardic in the 110s.  He had mild tachypnea.  He met sepsis protocol and code sepsis was initiated.  He was started on cefepime for concern for intra abdominal pathology.  The patient was discussed with Dr. Florina Ou, attending physician.  On exam, patient is writhing around on the bed.  It pain seems  significantly out of proportion to exam of the abdomen.  Differential diagnosis includes mesenteric ischemia, cholecystitis, bowel obstruction, pyelonephritis, urosepsis, aortic dissection, pancreatitis, DKA, or HHS.   Lactate 6.6. Repeat 6.8, despite receiving 3.5 L of fluid for 30 cc/kg bolus per sepsis order set.  Given persistent lactic acidosis, there was concern for mesenteric ischemia given his continued significant pain that was poorly controlled.  Unfortunately, patient has a new AKI with a creatinine of 2.93 and is unable to undergo CTA.  I suspect AKI is the etiology of the patient's elevated troponin since repeat troponin was downtrending and patient has denied chest pain adamantly since arrival in the ED.  Patient was also hyperglycemic to 410.  He had a bicarb of 13, worsening from previous, with an anion gap of 16.  BHA was obtained and was 0.37. pH of 7.29 on VBG.  Mild metabolic acidosis, most likely due to to lactic acidosis from septic shock.  Less suspicious for metabolic acidosis with elevated anion gap from DKA after reviewing labs with Dr. Florina Ou.  Unfortunately, the patient's ED visit was complicated by development of A. fib with RVR.  Patient previously was sinus tachycardia.  Although this was any acute change that was noted on the monitor by RN, he is a poor candidate for cardioversion at this time.  He was started on a bolus and infusion of diltiazem and was rate controlled.  CT of the chest, abdomen, and pelvis is unremarkable.  Patient has required multiple doses of fentanyl, and continues to report that pain is poorly controlled.  Discussed with Dr. Ninfa Linden, general surgery.  He will come and evaluate the patient in the ED.  He recommends calling critical care for ICU admission.  He was seen and evaluated the patient in the ED.  Less suspicious for mesenteric ischemia given that ostomy stump is pink and does not look concerning for necrosis.  On repeat exam, abdominal pain  is now markedly improved.  Spoke with Dr. Carson Myrtle, critical care.  He will accept the patient for admission.  Patient was given p.o. fluids after evaluated by critical care and pain worsened to  10 out of 10 after swallowing water.  He will make the patient n.p.o.  The patient appears reasonably stabilized for admission considering the current resources, flow, and capabilities available in the ED at this time, and I doubt any other [facility] requiring further screening and/or treatment in the ED prior to admission.  Final Clinical Impression(s) / ED Diagnoses Final diagnoses:  Abdominal pain    Rx / DC Orders ED Discharge Orders     None        Joanne Gavel, PA-C 12/01/20 0833    Molp[place], Jenny Reichmann, MD 12/02/20 1636

## 2020-12-01 ENCOUNTER — Emergency Department (HOSPITAL_COMMUNITY): Payer: Medicare HMO

## 2020-12-01 ENCOUNTER — Other Ambulatory Visit: Payer: Self-pay

## 2020-12-01 ENCOUNTER — Encounter (HOSPITAL_COMMUNITY): Payer: Self-pay | Admitting: Pulmonary Disease

## 2020-12-01 DIAGNOSIS — R579 Shock, unspecified: Secondary | ICD-10-CM | POA: Diagnosis not present

## 2020-12-01 DIAGNOSIS — R569 Unspecified convulsions: Secondary | ICD-10-CM | POA: Diagnosis not present

## 2020-12-01 DIAGNOSIS — J9611 Chronic respiratory failure with hypoxia: Secondary | ICD-10-CM | POA: Diagnosis not present

## 2020-12-01 DIAGNOSIS — T451X5A Adverse effect of antineoplastic and immunosuppressive drugs, initial encounter: Secondary | ICD-10-CM | POA: Diagnosis present

## 2020-12-01 DIAGNOSIS — B370 Candidal stomatitis: Secondary | ICD-10-CM | POA: Diagnosis not present

## 2020-12-01 DIAGNOSIS — R471 Dysarthria and anarthria: Secondary | ICD-10-CM | POA: Diagnosis not present

## 2020-12-01 DIAGNOSIS — D84821 Immunodeficiency due to drugs: Secondary | ICD-10-CM | POA: Diagnosis present

## 2020-12-01 DIAGNOSIS — K2100 Gastro-esophageal reflux disease with esophagitis, without bleeding: Secondary | ICD-10-CM | POA: Diagnosis not present

## 2020-12-01 DIAGNOSIS — L8990 Pressure ulcer of unspecified site, unspecified stage: Secondary | ICD-10-CM | POA: Insufficient documentation

## 2020-12-01 DIAGNOSIS — R34 Anuria and oliguria: Secondary | ICD-10-CM | POA: Diagnosis not present

## 2020-12-01 DIAGNOSIS — Z7189 Other specified counseling: Secondary | ICD-10-CM | POA: Diagnosis not present

## 2020-12-01 DIAGNOSIS — R609 Edema, unspecified: Secondary | ICD-10-CM | POA: Diagnosis not present

## 2020-12-01 DIAGNOSIS — N184 Chronic kidney disease, stage 4 (severe): Secondary | ICD-10-CM | POA: Diagnosis present

## 2020-12-01 DIAGNOSIS — I4891 Unspecified atrial fibrillation: Secondary | ICD-10-CM | POA: Diagnosis not present

## 2020-12-01 DIAGNOSIS — I639 Cerebral infarction, unspecified: Secondary | ICD-10-CM | POA: Diagnosis not present

## 2020-12-01 DIAGNOSIS — F419 Anxiety disorder, unspecified: Secondary | ICD-10-CM | POA: Diagnosis not present

## 2020-12-01 DIAGNOSIS — R6521 Severe sepsis with septic shock: Secondary | ICD-10-CM | POA: Diagnosis not present

## 2020-12-01 DIAGNOSIS — R109 Unspecified abdominal pain: Secondary | ICD-10-CM | POA: Diagnosis present

## 2020-12-01 DIAGNOSIS — G7281 Critical illness myopathy: Secondary | ICD-10-CM | POA: Diagnosis not present

## 2020-12-01 DIAGNOSIS — J9621 Acute and chronic respiratory failure with hypoxia: Secondary | ICD-10-CM | POA: Diagnosis not present

## 2020-12-01 DIAGNOSIS — R000 Tachycardia, unspecified: Secondary | ICD-10-CM | POA: Diagnosis present

## 2020-12-01 DIAGNOSIS — E872 Acidosis: Secondary | ICD-10-CM | POA: Diagnosis not present

## 2020-12-01 DIAGNOSIS — R338 Other retention of urine: Secondary | ICD-10-CM | POA: Diagnosis not present

## 2020-12-01 DIAGNOSIS — I82411 Acute embolism and thrombosis of right femoral vein: Secondary | ICD-10-CM | POA: Diagnosis not present

## 2020-12-01 DIAGNOSIS — J95851 Ventilator associated pneumonia: Secondary | ICD-10-CM | POA: Diagnosis not present

## 2020-12-01 DIAGNOSIS — J690 Pneumonitis due to inhalation of food and vomit: Secondary | ICD-10-CM | POA: Diagnosis not present

## 2020-12-01 DIAGNOSIS — L929 Granulomatous disorder of the skin and subcutaneous tissue, unspecified: Secondary | ICD-10-CM | POA: Diagnosis not present

## 2020-12-01 DIAGNOSIS — Z930 Tracheostomy status: Secondary | ICD-10-CM | POA: Diagnosis not present

## 2020-12-01 DIAGNOSIS — C188 Malignant neoplasm of overlapping sites of colon: Secondary | ICD-10-CM | POA: Diagnosis present

## 2020-12-01 DIAGNOSIS — R509 Fever, unspecified: Secondary | ICD-10-CM | POA: Diagnosis not present

## 2020-12-01 DIAGNOSIS — R1084 Generalized abdominal pain: Secondary | ICD-10-CM | POA: Diagnosis not present

## 2020-12-01 DIAGNOSIS — E440 Moderate protein-calorie malnutrition: Secondary | ICD-10-CM | POA: Diagnosis present

## 2020-12-01 DIAGNOSIS — N179 Acute kidney failure, unspecified: Secondary | ICD-10-CM | POA: Diagnosis not present

## 2020-12-01 DIAGNOSIS — Z20822 Contact with and (suspected) exposure to covid-19: Secondary | ICD-10-CM | POA: Diagnosis not present

## 2020-12-01 DIAGNOSIS — Y848 Other medical procedures as the cause of abnormal reaction of the patient, or of later complication, without mention of misadventure at the time of the procedure: Secondary | ICD-10-CM | POA: Diagnosis not present

## 2020-12-01 DIAGNOSIS — N170 Acute kidney failure with tubular necrosis: Secondary | ICD-10-CM | POA: Diagnosis not present

## 2020-12-01 DIAGNOSIS — E1100 Type 2 diabetes mellitus with hyperosmolarity without nonketotic hyperglycemic-hyperosmolar coma (NKHHC): Secondary | ICD-10-CM | POA: Diagnosis present

## 2020-12-01 DIAGNOSIS — G9341 Metabolic encephalopathy: Secondary | ICD-10-CM | POA: Diagnosis not present

## 2020-12-01 DIAGNOSIS — A419 Sepsis, unspecified organism: Secondary | ICD-10-CM | POA: Diagnosis not present

## 2020-12-01 DIAGNOSIS — Z9911 Dependence on respirator [ventilator] status: Secondary | ICD-10-CM | POA: Diagnosis not present

## 2020-12-01 DIAGNOSIS — K269 Duodenal ulcer, unspecified as acute or chronic, without hemorrhage or perforation: Secondary | ICD-10-CM | POA: Diagnosis not present

## 2020-12-01 DIAGNOSIS — R1013 Epigastric pain: Secondary | ICD-10-CM | POA: Diagnosis not present

## 2020-12-01 DIAGNOSIS — I824Y1 Acute embolism and thrombosis of unspecified deep veins of right proximal lower extremity: Secondary | ICD-10-CM | POA: Diagnosis not present

## 2020-12-01 DIAGNOSIS — Z978 Presence of other specified devices: Secondary | ICD-10-CM | POA: Diagnosis not present

## 2020-12-01 DIAGNOSIS — R112 Nausea with vomiting, unspecified: Secondary | ICD-10-CM | POA: Diagnosis not present

## 2020-12-01 DIAGNOSIS — Z992 Dependence on renal dialysis: Secondary | ICD-10-CM | POA: Diagnosis not present

## 2020-12-01 DIAGNOSIS — I82431 Acute embolism and thrombosis of right popliteal vein: Secondary | ICD-10-CM | POA: Diagnosis not present

## 2020-12-01 DIAGNOSIS — R5381 Other malaise: Secondary | ICD-10-CM | POA: Diagnosis not present

## 2020-12-01 DIAGNOSIS — Z23 Encounter for immunization: Secondary | ICD-10-CM | POA: Diagnosis not present

## 2020-12-01 DIAGNOSIS — J9601 Acute respiratory failure with hypoxia: Secondary | ICD-10-CM | POA: Diagnosis not present

## 2020-12-01 DIAGNOSIS — A4152 Sepsis due to Pseudomonas: Secondary | ICD-10-CM | POA: Diagnosis not present

## 2020-12-01 DIAGNOSIS — B371 Pulmonary candidiasis: Secondary | ICD-10-CM | POA: Diagnosis not present

## 2020-12-01 DIAGNOSIS — K209 Esophagitis, unspecified without bleeding: Secondary | ICD-10-CM | POA: Diagnosis not present

## 2020-12-01 DIAGNOSIS — J151 Pneumonia due to Pseudomonas: Secondary | ICD-10-CM | POA: Diagnosis not present

## 2020-12-01 DIAGNOSIS — C184 Malignant neoplasm of transverse colon: Secondary | ICD-10-CM | POA: Diagnosis not present

## 2020-12-01 DIAGNOSIS — J9600 Acute respiratory failure, unspecified whether with hypoxia or hypercapnia: Secondary | ICD-10-CM | POA: Diagnosis not present

## 2020-12-01 DIAGNOSIS — K559 Vascular disorder of intestine, unspecified: Secondary | ICD-10-CM | POA: Diagnosis not present

## 2020-12-01 DIAGNOSIS — I6340 Cerebral infarction due to embolism of unspecified cerebral artery: Secondary | ICD-10-CM | POA: Diagnosis not present

## 2020-12-01 DIAGNOSIS — Z430 Encounter for attention to tracheostomy: Secondary | ICD-10-CM | POA: Diagnosis not present

## 2020-12-01 DIAGNOSIS — R1310 Dysphagia, unspecified: Secondary | ICD-10-CM | POA: Diagnosis not present

## 2020-12-01 DIAGNOSIS — D649 Anemia, unspecified: Secondary | ICD-10-CM | POA: Diagnosis not present

## 2020-12-01 DIAGNOSIS — N171 Acute kidney failure with acute cortical necrosis: Secondary | ICD-10-CM | POA: Diagnosis not present

## 2020-12-01 DIAGNOSIS — Z515 Encounter for palliative care: Secondary | ICD-10-CM | POA: Diagnosis not present

## 2020-12-01 DIAGNOSIS — G4733 Obstructive sleep apnea (adult) (pediatric): Secondary | ICD-10-CM | POA: Diagnosis not present

## 2020-12-01 DIAGNOSIS — T17900S Unspecified foreign body in respiratory tract, part unspecified causing asphyxiation, sequela: Secondary | ICD-10-CM | POA: Diagnosis not present

## 2020-12-01 DIAGNOSIS — D696 Thrombocytopenia, unspecified: Secondary | ICD-10-CM | POA: Diagnosis not present

## 2020-12-01 DIAGNOSIS — N182 Chronic kidney disease, stage 2 (mild): Secondary | ICD-10-CM | POA: Diagnosis not present

## 2020-12-01 DIAGNOSIS — T17998A Other foreign object in respiratory tract, part unspecified causing other injury, initial encounter: Secondary | ICD-10-CM | POA: Diagnosis not present

## 2020-12-01 LAB — URINALYSIS, ROUTINE W REFLEX MICROSCOPIC
Bilirubin Urine: NEGATIVE
Glucose, UA: >=500 mg/dL — AB
Hgb urine dipstick: NEGATIVE
Ketones, ur: NEGATIVE mg/dL
Leukocytes,Ua: NEGATIVE
Nitrite: NEGATIVE
Protein, ur: 100 mg/dL — AB
Specific Gravity, Urine: 1.023 (ref 1.005–1.030)
pH: 5 (ref 5.0–8.0)

## 2020-12-01 LAB — CBG MONITORING, ED
Glucose-Capillary: 280 mg/dL — ABNORMAL HIGH (ref 70–99)
Glucose-Capillary: 238 mg/dL — ABNORMAL HIGH (ref 70–99)
Glucose-Capillary: 225 mg/dL — ABNORMAL HIGH (ref 70–99)

## 2020-12-01 LAB — BASIC METABOLIC PANEL
Anion gap: 11 (ref 5–15)
Anion gap: 7 (ref 5–15)
BUN: 58 mg/dL — ABNORMAL HIGH (ref 8–23)
BUN: 56 mg/dL — ABNORMAL HIGH (ref 8–23)
CO2: 15 mmol/L — ABNORMAL LOW (ref 22–32)
CO2: 19 mmol/L — ABNORMAL LOW (ref 22–32)
Calcium: 8.2 mg/dL — ABNORMAL LOW (ref 8.9–10.3)
Calcium: 8.3 mg/dL — ABNORMAL LOW (ref 8.9–10.3)
Chloride: 106 mmol/L (ref 98–111)
Chloride: 110 mmol/L (ref 98–111)
Creatinine, Ser: 2.12 mg/dL — ABNORMAL HIGH (ref 0.61–1.24)
Creatinine, Ser: 1.73 mg/dL — ABNORMAL HIGH (ref 0.61–1.24)
GFR, Estimated: 34 mL/min — ABNORMAL LOW (ref >60)
GFR, Estimated: 44 mL/min — ABNORMAL LOW (ref >60)
Glucose, Bld: 306 mg/dL — ABNORMAL HIGH (ref 70–99)
Glucose, Bld: 203 mg/dL — ABNORMAL HIGH (ref 70–99)
Potassium: 5.1 mmol/L (ref 3.5–5.1)
Potassium: 4.5 mmol/L (ref 3.5–5.1)
Sodium: 132 mmol/L — ABNORMAL LOW (ref 135–145)
Sodium: 136 mmol/L (ref 135–145)

## 2020-12-01 LAB — BETA-HYDROXYBUTYRIC ACID: Beta-Hydroxybutyric Acid: 0.37 mmol/L — ABNORMAL HIGH (ref 0.05–0.27)

## 2020-12-01 LAB — BLOOD GAS, VENOUS
Acid-base deficit: 11 mmol/L — ABNORMAL HIGH (ref 0.0–2.0)
Bicarbonate: 14 mmol/L — ABNORMAL LOW (ref 20.0–28.0)
O2 Saturation: 59.3 %
Patient temperature: 37
pCO2, Ven: 29.4 mmHg — ABNORMAL LOW (ref 44.0–60.0)
pH, Ven: 7.299 (ref 7.250–7.430)
pO2, Ven: 38.6 mmHg (ref 32.0–45.0)

## 2020-12-01 LAB — CBC
HCT: 33.1 % — ABNORMAL LOW (ref 39.0–52.0)
Hemoglobin: 11.7 g/dL — ABNORMAL LOW (ref 13.0–17.0)
MCH: 26.6 pg (ref 26.0–34.0)
MCHC: 35.3 g/dL (ref 30.0–36.0)
MCV: 75.2 fL — ABNORMAL LOW (ref 80.0–100.0)
Platelets: 212 10*3/uL (ref 150–400)
RBC: 4.4 MIL/uL (ref 4.22–5.81)
RDW: 20.5 % — ABNORMAL HIGH (ref 11.5–15.5)
WBC: 4.1 10*3/uL (ref 4.0–10.5)
nRBC: 0.5 % — ABNORMAL HIGH (ref 0.0–0.2)

## 2020-12-01 LAB — LACTIC ACID, PLASMA
Lactic Acid, Venous: 4.3 mmol/L (ref 0.5–1.9)
Lactic Acid, Venous: 5.4 mmol/L (ref 0.5–1.9)
Lactic Acid, Venous: 6.8 mmol/L (ref 0.5–1.9)

## 2020-12-01 LAB — GLUCOSE, CAPILLARY
Glucose-Capillary: 242 mg/dL — ABNORMAL HIGH (ref 70–99)
Glucose-Capillary: 177 mg/dL — ABNORMAL HIGH (ref 70–99)
Glucose-Capillary: 184 mg/dL — ABNORMAL HIGH (ref 70–99)
Glucose-Capillary: 176 mg/dL — ABNORMAL HIGH (ref 70–99)
Glucose-Capillary: 237 mg/dL — ABNORMAL HIGH (ref 70–99)
Glucose-Capillary: 270 mg/dL — ABNORMAL HIGH (ref 70–99)

## 2020-12-01 LAB — RAPID URINE DRUG SCREEN, HOSP PERFORMED
Amphetamines: NOT DETECTED
Barbiturates: NOT DETECTED
Benzodiazepines: NOT DETECTED
Cocaine: NOT DETECTED
Opiates: NOT DETECTED
Tetrahydrocannabinol: NOT DETECTED

## 2020-12-01 LAB — TROPONIN I (HIGH SENSITIVITY): Troponin I (High Sensitivity): 18 ng/L — ABNORMAL HIGH (ref <18)

## 2020-12-01 LAB — MRSA NEXT GEN BY PCR, NASAL: MRSA by PCR Next Gen: NOT DETECTED

## 2020-12-01 LAB — MAGNESIUM
Magnesium: 1.7 mg/dL (ref 1.7–2.4)
Magnesium: 2.1 mg/dL (ref 1.7–2.4)

## 2020-12-01 LAB — LIPASE, BLOOD: Lipase: 24 U/L (ref 11–51)

## 2020-12-01 LAB — URIC ACID: Uric Acid, Serum: 8.2 mg/dL (ref 3.7–8.6)

## 2020-12-01 LAB — OSMOLALITY: Osmolality: 320 mOsm/kg — ABNORMAL HIGH (ref 275–295)

## 2020-12-01 MED ORDER — HEPARIN SODIUM (PORCINE) 5000 UNIT/ML IJ SOLN: 5000.0000 [IU] | Freq: Three times a day (TID) | INTRAMUSCULAR | Status: DC

## 2020-12-01 MED ORDER — DEXTROSE IN LACTATED RINGERS 5 % IV SOLN: INTRAVENOUS | Status: DC

## 2020-12-01 MED ORDER — SODIUM CHLORIDE 0.9 % IV SOLN
25.0000 mg | Freq: Four times a day (QID) | INTRAVENOUS | Status: DC | PRN
  Administered 2020-12-01 – 2020-12-03 (×2): 25 mg via INTRAVENOUS
  Filled 2020-12-01 (×3): qty 1

## 2020-12-01 MED ORDER — PROMETHAZINE HCL 25 MG/ML IJ SOLN: 25.0000 mg | Freq: Once | INTRAVENOUS | Status: DC

## 2020-12-01 MED ORDER — SODIUM CHLORIDE 0.9 % IV SOLN
INTRAVENOUS | Status: DC | PRN
  Administered 2020-12-01: 500 mL via INTRAVENOUS
  Administered 2020-12-06 – 2020-12-09 (×5): 250 mL via INTRAVENOUS
  Administered 2020-12-17: 500 mL via INTRAVENOUS
  Administered 2020-12-17: 250 mL via INTRAVENOUS
  Administered 2021-02-14: 500 mL via INTRAVENOUS
  Administered 2021-02-24 – 2021-02-25 (×3): 250 mL via INTRAVENOUS

## 2020-12-01 MED ORDER — LACTATED RINGERS IV SOLN: INTRAVENOUS | Status: DC

## 2020-12-01 MED ORDER — CHLORHEXIDINE GLUCONATE 0.12 % MT SOLN
15.0000 mL | Freq: Two times a day (BID) | OROMUCOSAL | Status: DC
  Administered 2020-12-01 – 2020-12-05 (×10): 15 mL via OROMUCOSAL
  Filled 2020-12-01 (×10): qty 15

## 2020-12-01 MED ORDER — VANCOMYCIN HCL 2000 MG/400ML IV SOLN
2000.0000 mg | Freq: Once | INTRAVENOUS | Status: AC
  Administered 2020-12-01: 2000 mg via INTRAVENOUS
  Filled 2020-12-01: qty 400

## 2020-12-01 MED ORDER — DILTIAZEM HCL-DEXTROSE 125-5 MG/125ML-% IV SOLN (PREMIX)
5.0000 mg/h | INTRAVENOUS | Status: DC
Start: 2020-12-01 — End: 2020-12-02
  Administered 2020-12-01: 5 mg/h via INTRAVENOUS
  Administered 2020-12-02: 15 mg/h via INTRAVENOUS
  Filled 2020-12-01 (×2): qty 125

## 2020-12-01 MED ORDER — LACTATED RINGERS IV BOLUS
500.0000 mL | Freq: Once | INTRAVENOUS | Status: AC
  Administered 2020-12-01: 500 mL via INTRAVENOUS

## 2020-12-01 MED ORDER — FENTANYL CITRATE (PF) 100 MCG/2ML IJ SOLN
50.0000 ug | INTRAMUSCULAR | Status: DC | PRN
  Administered 2020-12-01 (×2): 50 ug via INTRAVENOUS
  Filled 2020-12-01 (×2): qty 2

## 2020-12-01 MED ORDER — DILTIAZEM HCL-DEXTROSE 125-5 MG/125ML-% IV SOLN (PREMIX)
5.0000 mg/h | INTRAVENOUS | Status: DC
  Administered 2020-12-01: 5 mg/h via INTRAVENOUS
  Filled 2020-12-01: qty 125

## 2020-12-01 MED ORDER — FENTANYL CITRATE (PF) 100 MCG/2ML IJ SOLN
100.0000 ug | Freq: Once | INTRAMUSCULAR | Status: AC
Start: 2020-12-01 — End: 2020-12-01
  Administered 2020-12-01: 100 ug via INTRAVENOUS
  Filled 2020-12-01: qty 2

## 2020-12-01 MED ORDER — CHLORHEXIDINE GLUCONATE CLOTH 2 % EX PADS
6.0000 | MEDICATED_PAD | Freq: Every day | CUTANEOUS | Status: DC
  Administered 2020-12-01 – 2020-12-19 (×23): 6 via TOPICAL

## 2020-12-01 MED ORDER — DILTIAZEM LOAD VIA INFUSION
20.0000 mg | Freq: Once | INTRAVENOUS | Status: AC
  Administered 2020-12-01: 20 mg via INTRAVENOUS
  Filled 2020-12-01: qty 20

## 2020-12-01 MED ORDER — SODIUM CHLORIDE 0.9 % IV SOLN
25.0000 mg | Freq: Four times a day (QID) | INTRAVENOUS | Status: DC | PRN
  Administered 2020-12-01: 25 mg via INTRAVENOUS
  Filled 2020-12-01 (×2): qty 1

## 2020-12-01 MED ORDER — ORAL CARE MOUTH RINSE
15.0000 mL | Freq: Two times a day (BID) | OROMUCOSAL | Status: DC
  Administered 2020-12-01 – 2020-12-05 (×10): 15 mL via OROMUCOSAL

## 2020-12-01 MED ORDER — DILTIAZEM LOAD VIA INFUSION
15.0000 mg | Freq: Once | INTRAVENOUS | Status: AC
  Administered 2020-12-01: 15 mg via INTRAVENOUS
  Filled 2020-12-01: qty 15

## 2020-12-01 MED ORDER — INSULIN REGULAR(HUMAN) IN NACL 100-0.9 UT/100ML-% IV SOLN
INTRAVENOUS | Status: DC
  Administered 2020-12-01: 10 [IU]/h via INTRAVENOUS
  Filled 2020-12-01: qty 100

## 2020-12-01 MED ORDER — LOPERAMIDE HCL 1 MG/7.5ML PO SUSP
2.0000 mg | Freq: Three times a day (TID) | ORAL | Status: DC
  Administered 2020-12-01 – 2020-12-05 (×12): 2 mg via ORAL
  Filled 2020-12-01 (×18): qty 15

## 2020-12-01 MED ORDER — FENTANYL CITRATE (PF) 100 MCG/2ML IJ SOLN
100.0000 ug | INTRAMUSCULAR | Status: DC | PRN
  Administered 2020-12-01 – 2020-12-03 (×11): 100 ug via INTRAVENOUS
  Filled 2020-12-01 (×12): qty 2

## 2020-12-01 MED ORDER — SODIUM CHLORIDE 0.9 % IV SOLN
750.0000 mg | Freq: Two times a day (BID) | INTRAVENOUS | Status: DC
  Administered 2020-12-01 – 2020-12-03 (×5): 750 mg via INTRAVENOUS
  Filled 2020-12-01 (×5): qty 7.5

## 2020-12-01 MED ORDER — FENTANYL CITRATE (PF) 100 MCG/2ML IJ SOLN
100.0000 ug | Freq: Once | INTRAMUSCULAR | Status: AC
  Administered 2020-12-01: 100 ug via INTRAVENOUS
  Filled 2020-12-01: qty 2

## 2020-12-01 MED ORDER — DIPHENOXYLATE-ATROPINE 2.5-0.025 MG/5ML PO LIQD
5.0000 mL | Freq: Four times a day (QID) | ORAL | Status: DC
  Administered 2020-12-01 – 2020-12-03 (×8): 5 mL via ORAL
  Filled 2020-12-01 (×8): qty 5

## 2020-12-01 MED ORDER — AMIODARONE IV BOLUS ONLY 150 MG/100ML
150.0000 mg | Freq: Once | INTRAVENOUS | Status: AC
  Administered 2020-12-01: 150 mg via INTRAVENOUS
  Filled 2020-12-01: qty 100

## 2020-12-01 MED ORDER — HEPARIN SODIUM (PORCINE) 5000 UNIT/ML IJ SOLN
5000.0000 [IU] | Freq: Three times a day (TID) | INTRAMUSCULAR | Status: DC
Start: 2020-12-01 — End: 2020-12-04
  Administered 2020-12-01 – 2020-12-04 (×10): 5000 [IU] via SUBCUTANEOUS
  Filled 2020-12-01 (×10): qty 1

## 2020-12-01 MED ORDER — DEXTROSE 50 % IV SOLN
0.0000 mL | INTRAVENOUS | Status: DC | PRN
  Administered 2021-01-01: 50 mL via INTRAVENOUS
  Filled 2020-12-01: qty 50

## 2020-12-01 MED ORDER — SODIUM CHLORIDE 0.9 % IV SOLN
INTRAVENOUS | Status: DC | PRN
  Administered 2020-12-01: 500 mL via INTRAVENOUS
  Administered 2020-12-07 – 2020-12-09 (×4): 250 mL via INTRAVENOUS

## 2020-12-01 MED ORDER — SODIUM CHLORIDE 0.9 % IV SOLN
2.0000 g | Freq: Two times a day (BID) | INTRAVENOUS | Status: DC
  Administered 2020-12-01 – 2020-12-02 (×4): 2 g via INTRAVENOUS
  Filled 2020-12-01 (×5): qty 2

## 2020-12-01 MED ORDER — INSULIN ASPART 100 UNIT/ML IJ SOLN
0.0000 [IU] | INTRAMUSCULAR | Status: DC
  Administered 2020-12-01: 3 [IU] via SUBCUTANEOUS
  Administered 2020-12-01: 8 [IU] via SUBCUTANEOUS
  Administered 2020-12-01 – 2020-12-02 (×2): 5 [IU] via SUBCUTANEOUS
  Administered 2020-12-02: 3 [IU] via SUBCUTANEOUS
  Administered 2020-12-02 – 2020-12-03 (×8): 5 [IU] via SUBCUTANEOUS
  Administered 2020-12-03 – 2020-12-04 (×4): 3 [IU] via SUBCUTANEOUS
  Administered 2020-12-04: 5 [IU] via SUBCUTANEOUS
  Administered 2020-12-04 (×2): 3 [IU] via SUBCUTANEOUS
  Administered 2020-12-04: 5 [IU] via SUBCUTANEOUS
  Administered 2020-12-05: 3 [IU] via SUBCUTANEOUS
  Administered 2020-12-05: 5 [IU] via SUBCUTANEOUS
  Administered 2020-12-05 (×2): 3 [IU] via SUBCUTANEOUS
  Administered 2020-12-05: 2 [IU] via SUBCUTANEOUS
  Administered 2020-12-05 – 2020-12-06 (×3): 3 [IU] via SUBCUTANEOUS
  Administered 2020-12-06 (×3): 2 [IU] via SUBCUTANEOUS
  Administered 2020-12-06: 3 [IU] via SUBCUTANEOUS

## 2020-12-01 NOTE — Progress Notes (Signed)
Pt became tachycardic up to the 180's at approx 1730. EKG showed Afib RVR. Dr. Ander Slade paged. Orders received for 15mg  cardizem bolus and for a gtt to be reinitiated. Also made MD aware of a decrease in urine output this PM. Received order for 500bolus of LR.

## 2020-12-01 NOTE — Progress Notes (Signed)
Pharmacy Antibiotic Note  Thomas [name] is a 65 y.o. male admitted on 11/30/2020 with sepsis.  Pharmacy has been consulted for Cefepime dosing. AKI- improving  Plan: Cefepime 2gm IV q12h Monitor renal function and cx data      Temp (24hrs), Avg:99.7 F (37.6 C), Min:98.3 F (36.8 C), Max:101.1 F (38.4 C)  Recent Labs  Lab 11/27/20 2040 11/30/20 2205 12/01/20 0018 12/01/20 0228 12/01/20 0432  WBC 5.4 4.8  --   --  4.1  CREATININE 1.60* 2.93*  --   --   --   LATICACIDVEN  --  6.6* 6.8* 5.4*  --     Estimated Creatinine Clearance: 30.8 mL/min (A) (by C-G formula based on SCr of 2.93 mg/dL (H)).    No Known Allergies  Antimicrobials this admission: 7/24 Cefepime >>  7/25 Vancomycin x1 in ED  Dose adjustments this admission:  Microbiology results: 7/24 BCx:  7/25 UCx:    Thank you for allowing pharmacy to be a part of this patient's care.  Netta Cedars PharmD 12/01/2020 4:50 AM

## 2020-12-01 NOTE — Progress Notes (Signed)
CODE SEPSIS tracking by eLINK 

## 2020-12-01 NOTE — Progress Notes (Signed)
Notified provider of need to order repeat lactic acid. ° °

## 2020-12-01 NOTE — Consult Note (Addendum)
Reason for Consult:possible ischemic bowel Referring Physician: Dr. Severiano Gilbert  Thomas [name] is an 65 y.o. male.  HPI: This is a 65 year old gentleman with a history of stage IIIb colon cancer undergoing active chemotherapy who I have been asked to evaluate for possible ischemic bowel.  He presented here last evening with severe abdominal pain.  He has been having worsening intermittent abdominal pain for the last 2 weeks.  He last received chemotherapy on 7/22 per his report.  He reports his ostomy has been working well.  He is also has some shortness of breath and mild cough off and on.  At presentation his pain was apparently out of proportion to exam.  He was found to have a normal white blood count but had a significantly elevated lactic acid to as high as 6.8.  He has been undergoing of aggressive IV rehydration in the emergency department since arrival.  He is also apparently been in A. fib. He reports now that his abdominal pain has improved but it is hard for him to characterize.  He had undergone an exploratory laparotomy with transverse colectomy and colostomy on 09/10/2020 for the near obstructing colon cancer.  He has since had a Port-A-Cath placed and again is undergoing chemotherapy.  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   Dizziness    Dyspnea    pt states d/t weight   Epilepsy ([place])    takes Keppra daily   GERD (gastroesophageal reflux disease)    takes Omeprazole daily   Headache    Hyperlipidemia    takes Atorvastatin daily   Hypertension    takes Lotrel daily   Peripheral edema    takes Lasix daily   Peripheral neuropathy    takes Gabapentin daily   Pneumonia 3  yrs ago   hx of   Seizures ([place])    Sleep apnea    Urinary frequency     Past Surgical History:  Procedure Laterality Date   BIOPSY  09/05/2020   Procedure: BIOPSY;  Surgeon: Irene Shipper, MD;  Location: [facility] ENDOSCOPY;  Service: Endoscopy;;   CARDIAC CATHETERIZATION  7 yrs ago   COLONOSCOPY WITH PROPOFOL N/A 09/05/2020   Procedure: COLONOSCOPY WITH PROPOFOL;  Surgeon: Irene Shipper, MD;  Location: [facility] ENDOSCOPY;  Service: Endoscopy;  Laterality: N/A;   CRANIOTOMY N/A 09/01/2016   Procedure: CRANIOTOMY TUMOR  LEFT PTERIONAL;  Surgeon: Ashok Pall, MD;  Location: [place];  Service: Neurosurgery;  Laterality: N/A;   cyst removed from chest      as a child   NO PAST SURGERIES     PARTIAL COLECTOMY N/A 09/10/2020   Procedure: TRANSVERSE COLECTOMY;  Surgeon: [name], MD;  Location: [place];  Service: General;  Laterality: N/A;   POLYPECTOMY  09/05/2020   Procedure: POLYPECTOMY;  Surgeon: Irene Shipper, MD;  Location: [facility] ENDOSCOPY;  Service: Endoscopy;;   PORTACATH PLACEMENT Right 10/01/2020   Procedure: INSERTION PORT-A-CATH;  Surgeon: [name], MD;  Location: [place];  Service: General;  Laterality: Right;   SUBMUCOSAL TATTOO INJECTION  09/05/2020   Procedure: SUBMUCOSAL TATTOO INJECTION;  Surgeon: Irene Shipper, MD;  Location: [facility] ENDOSCOPY;  Service: Endoscopy;;    Family History  Problem Relation Age of Onset   Breast cancer Mother    Breast cancer Maternal Aunt    Prostate cancer Maternal Uncle     Social History:  reports that he has quit smoking. He has never used smokeless tobacco. He reports that he does not drink alcohol and does not use drugs.  Allergies: No Known Allergies  Medications: I have reviewed the patient's current medications.  Results for orders placed or performed during the hospital encounter of 11/30/20 (from the past 48 hour(s))  Resp Panel by RT-PCR (Flu A&B, Covid) Nasopharyngeal Swab     Status: None   Collection Time: 11/30/20 10:05 PM    Specimen: Nasopharyngeal Swab; Nasopharyngeal(NP) swabs in vial transport medium  Result Value Ref Range   SARS Coronavirus 2 by RT PCR NEGATIVE NEGATIVE    Comment: (NOTE) SARS-CoV-2 target nucleic acids are NOT DETECTED.  The SARS-CoV-2 RNA is generally detectable in upper respiratory specimens during the acute phase of infection. The lowest concentration of SARS-CoV-2 viral copies this assay can detect is 138 copies/mL. A negative result does not preclude SARS-Cov-2 infection and should not be used as the sole basis for treatment or other patient management decisions. A negative result may occur with  improper specimen collection/handling, submission of specimen other than nasopharyngeal swab, presence of viral mutation(s) within the areas targeted by this assay, and inadequate number of viral copies(<138 copies/mL). A negative result must be combined with clinical observations, patient history, and epidemiological information. The expected result is Negative.  Fact Sheet for Patients:  EntrepreneurPulse.com.au  Fact Sheet for Healthcare Providers:  IncredibleEmployment.be  This test is no t yet approved or cleared by the [place] FDA and  has been authorized for detection and/or diagnosis of SARS-CoV-2 by FDA under an Emergency Use Authorization (EUA). This EUA will remain  in effect (meaning this test can be used) for the duration of the COVID-19 declaration under Section 564(b)(1) of the Act, 21 U.S.C.section 360bbb-3(b)(1), unless the authorization is terminated  or revoked sooner.       Influenza A by PCR NEGATIVE NEGATIVE   Influenza B by PCR NEGATIVE NEGATIVE    Comment: (NOTE) The Xpert Xpress SARS-CoV-2/FLU/RSV plus assay is intended as an aid in the diagnosis of influenza from Nasopharyngeal swab specimens and should not be used as a sole basis for treatment. Nasal washings and aspirates are unacceptable for Xpert Xpress  SARS-CoV-2/FLU/RSV testing.  Fact Sheet for Patients: EntrepreneurPulse.com.au  Fact Sheet for Healthcare Providers: IncredibleEmployment.be  This test is not yet approved or cleared by the [place] FDA and has been authorized for detection and/or diagnosis of SARS-CoV-2 by FDA under an Emergency Use Authorization (EUA). This EUA will remain in effect (meaning this test can be used) for the duration of the COVID-19 declaration under Section 564(b)(1) of the Act, 21 U.S.C. section 360bbb-3(b)(1), unless the authorization is terminated or revoked.  Performed at [facility], [place] [address], [place], [place] [address]   Lactic acid, plasma     Status: Abnormal   Collection Time: 11/30/20 10:05 PM  Result Value Ref Range   Lactic Acid, Venous 6.6 (HH) 0.5 - 1.9 mmol/L    Comment: CRITICAL RESULT CALLED TO, READ BACK BY AND VERIFIED WITH: Zenon Mayo AT 2315 ON 11/30/20 BY Buel Ream Performed at [facility], [place] [address], [place], [place] [address]   Comprehensive metabolic  panel     Status: Abnormal   Collection Time: 11/30/20 10:05 PM  Result Value Ref Range   Sodium 133 (L) 135 - 145 mmol/L   Potassium 5.9 (H) 3.5 - 5.1 mmol/L   Chloride 104 98 - 111 mmol/L   CO2 13 (L) 22 - 32 mmol/L   Glucose, Bld 410 (H) 70 - 99 mg/dL    Comment: Glucose reference range applies only to samples taken after fasting for at least 8 hours.   BUN 71 (H) 8 - 23 mg/dL   Creatinine, Ser 2.93 (H) 0.61 - 1.24 mg/dL   Calcium 9.2 8.9 - 10.3 mg/dL   Total Protein 6.8 6.5 - 8.1 g/dL   Albumin 3.3 (L) 3.5 - 5.0 g/dL   AST 26 15 - 41 U/L   ALT 29 0 - 44 U/L   Alkaline Phosphatase 67 38 - 126 U/L   Total Bilirubin 1.4 (H) 0.3 - 1.2 mg/dL   GFR, Estimated 23 (L) >60 mL/min    Comment: (NOTE) Calculated using the CKD-EPI Creatinine Equation (2021)    Anion gap 16 (H) 5 - 15    Comment: Performed at [facility], [place] [address] [place], [place] [address]  CBC WITH DIFFERENTIAL     Status: Abnormal   Collection Time: 11/30/20 10:05 PM  Result Value Ref Range   WBC 4.8 4.0 - 10.5 K/uL   RBC 5.11 4.22 - 5.81 MIL/uL   Hemoglobin 13.6 13.0 - 17.0 g/dL   HCT 38.4 (L) 39.0 - 52.0 %   MCV 75.1 (L) 80.0 - 100.0 fL   MCH 26.6 26.0 - 34.0 pg   MCHC 35.4 30.0 - 36.0 g/dL   RDW 21.0 (H) 11.5 - 15.5 %   Platelets 265 150 - 400 K/uL   nRBC 0.6 (H) 0.0 - 0.2 %   Neutrophils Relative % 39 %   Band Neutrophils 10 %   Lymphocytes Relative 32 %   Monocytes Relative 17 %   Eosinophils Relative 1 %   Basophils Relative 1 %   WBC Morphology NORMAL    RBC Morphology NORMAL    Smear Review DONE    nRBC 3 (H) 0 /100 WBC   Metamyelocytes Relative NONE SEEN %   Myelocytes NONE SEEN %   Promyelocytes Relative NONE SEEN %   Blasts NONE SEEN %    Comment: Performed at [facility], [place] [address] [place], [place] [address]  Protime-INR     Status: Abnormal   Collection Time: 11/30/20 10:05 PM  Result Value Ref Range   Prothrombin Time 17.7 (H) 11.4 - 15.2 seconds   INR 1.5 (H) 0.8 - 1.2    Comment: (NOTE) INR goal varies based on device and disease states. Performed at [facility], [place] [address] [place], [place] [address]   APTT     Status: Abnormal   Collection Time: 11/30/20 10:05 PM  Result Value Ref Range   aPTT 22 (L) 24 - 36 seconds    Comment: Performed at [facility], [place] [address] [place], [place] [address]  Troponin I (High Sensitivity)     Status: Abnormal   Collection Time: 11/30/20 10:05 PM  Result Value Ref Range   Troponin I (High Sensitivity) 23 (H) <18 ng/L    Comment: (NOTE) Elevated high sensitivity troponin I (hsTnI) values and significant  changes across serial measurements may suggest ACS but many other  chronic and acute conditions are known  to elevate hsTnI results.  Refer to the "Links" section for chest  pain algorithms and additional  guidance. Performed at [facility], [place] [address] [place], [place] [address]   Beta-hydroxybutyric acid     Status: Abnormal   Collection Time: 11/30/20 10:05 PM  Result Value Ref Range   Beta-Hydroxybutyric Acid 0.37 (H) 0.05 - 0.27 mmol/L    Comment: Performed at [facility], [place] [address] [place], [place] [address]  Lactic acid, plasma     Status: Abnormal   Collection Time: 12/01/20 12:18 AM  Result Value Ref Range   Lactic Acid, Venous 6.8 (HH) 0.5 - 1.9 mmol/L    Comment: CRITICAL VALUE NOTED.  VALUE IS CONSISTENT WITH PREVIOUSLY REPORTED AND CALLED VALUE. Performed at [facility], [place] [address], [place], [place] [address]   Troponin I (High Sensitivity)     Status: Abnormal   Collection Time: 12/01/20 12:18 AM  Result Value Ref Range   Troponin I (High Sensitivity) 18 (H) <18 ng/L    Comment: (NOTE) Elevated high sensitivity troponin I (hsTnI) values and significant  changes across serial measurements may suggest ACS but many other  chronic and acute conditions are known to elevate hsTnI results.  Refer to the "Links" section for chest pain algorithms and additional  guidance. Performed at [facility], [place] [address] [place], [place] [address]   Blood gas, venous (at [facility], not at [facility])     Status: Abnormal   Collection Time: 12/01/20 12:18 AM  Result Value Ref Range   pH, Ven 7.299 7.250 - 7.430   pCO2, Ven 29.4 (L) 44.0 - 60.0 mmHg   pO2, Ven 38.6 32.0 - 45.0 mmHg   Bicarbonate 14.0 (L) 20.0 - 28.0 mmol/L   Acid-base deficit 11.0 (H) 0.0 - 2.0 mmol/L   O2 Saturation 59.3 %   Patient temperature 37.0     Comment: Performed at [facility], [place] [address], [place], [place] [address]  Urinalysis, Routine w reflex microscopic Urine, Clean Catch     Status: Abnormal   Collection Time: 12/01/20  2:00 AM  Result Value Ref Range    Color, Urine YELLOW YELLOW   APPearance CLEAR CLEAR   Specific Gravity, Urine 1.023 1.005 - 1.030   pH 5.0 5.0 - 8.0   Glucose, UA >=500 (A) NEGATIVE mg/dL   Hgb urine dipstick NEGATIVE NEGATIVE   Bilirubin Urine NEGATIVE NEGATIVE   Ketones, ur NEGATIVE NEGATIVE mg/dL   Protein, ur 100 (A) NEGATIVE mg/dL   Nitrite NEGATIVE NEGATIVE   Leukocytes,Ua NEGATIVE NEGATIVE   RBC / HPF 0-5 0 - 5 RBC/hpf   WBC, UA 0-5 0 - 5 WBC/hpf   Bacteria, UA RARE (A) NONE SEEN   Squamous Epithelial / LPF 0-5 0 - 5   Hyaline Casts, UA PRESENT     Comment: Performed at [facility], [place] [address] [place], [place] [address]  Lactic acid, plasma     Status: Abnormal   Collection Time: 12/01/20  2:28 AM  Result Value Ref Range   Lactic Acid, Venous 5.4 (HH) 0.5 - 1.9 mmol/L    Comment: CRITICAL VALUE NOTED.  VALUE IS CONSISTENT WITH PREVIOUSLY REPORTED AND CALLED VALUE. Performed at [facility], [place] [address] [place], [place] [address]     DG Chest Port 1 View  Result Date: 11/30/2020 CLINICAL DATA:  Questionable sepsis. Patient began treatment for colon cancer 3 months ago.  Loss of appetite, severe abdominal pain, weakness, and back pain. EXAM: PORTABLE CHEST 1 VIEW COMPARISON:  11/27/2020 FINDINGS: Heart size and pulmonary vascularity are normal. Lungs clear. Power port type central venous catheter with tip over the low SVC region. No pneumothorax. No pleural effusions. Mediastinal contours appear intact. IMPRESSION: No active disease. Electronically Signed   By: Lucienne Capers M.D.   On: 11/30/2020 22:36    Review of Systems  Constitutional:  Negative for fever.  Respiratory:  Positive for cough and shortness of breath.   Cardiovascular:  Negative for chest pain.  Gastrointestinal:  Positive for abdominal pain. Negative for blood in stool and diarrhea.  Genitourinary:  Negative for dysuria.  Blood pressure (!) 147/85, pulse (!) 114, temperature 98.3 F (36.8  C), temperature source Rectal, resp. rate 20, SpO2 100 %. Physical Exam Constitutional:      Appearance: He is obese. He is ill-appearing.  HENT:     Head: Normocephalic and atraumatic.     Mouth/Throat:     Comments: Dry Eyes:     Extraocular Movements: Extraocular movements intact.     Pupils: Pupils are equal, round, and reactive to light.  Cardiovascular:     Rate and Rhythm: Tachycardia present. Rhythm irregular.     Heart sounds: Normal heart sounds.  Pulmonary:     Breath sounds: No stridor. No wheezing.     Comments: Slight increased work of breathing Abdominal:     General: A surgical scar is present. There is no distension.     Hernia: No hernia is present.     Comments: His abdomen is obese.  He has a well-healed midline incision.  His right sided colostomy is pink and well perfused.  His abdomen is nondistended and surprisingly soft and he currently has almost no guarding at this point.  There is no rigidity or frank peritonitis No hepatomegaly  Skin:    General: Skin is warm and dry.  Neurological:     General: No focal deficit present.     Mental Status: He is alert.  Psychiatric:        Behavior: Behavior normal.  Extremities: Legs are warm.  He has strong femoral pulses bilaterally.  I believe I can feel dorsalis pedis pulses bilaterally.  Assessment/Plan: Abdominal pain of uncertain etiology with lactic acidosis, rule out intestinal ischemia  I have reviewed the noncontrasted CAT scan of his chest, abdomen and pelvis.  There are no dilated loops of bowel, no areas of pneumatosis, no free air, and no free fluid.  He does have a fairly calcified aorta.  His abdominal exam is surprisingly fairly benign.  Again, he does not have frank peritonitis and at least his ostomy is pink and well perfused.  He is profoundly dehydrated but this is being corrected and his last lactic acid level has improved.  Given his current abdominal exam, I do not believe he needs emergent  exploratory laparotomy right now.  He will need admission to the intensive care unit by critical care with ongoing aggressive management.  He may have had some transient low flow state to the intestines created the current situation.  The CT can was noncontrasted so I am uncertain if an aortic dissection can be completely ruled out but he does have palpable pulses distally. I have discussed our concerns with the patient.  He would like to hold on an emergent laparotomy as well.  Surgery will follow him closely with serial abdominal exams.  Coralie Keens MD 12/01/2020, 3:31  AM

## 2020-12-01 NOTE — Progress Notes (Signed)
PCCM Interval Progress Note  Seen by Dr. Carson Myrtle early AM.  See his H&P. Arrived to ICU, feels somewhat improved. Vitals stable, labs stable with improving lactate and glucose. Transitioned off insulin gtt and started on SSI.  Ongoing fluid resuscitation Seen by surgery who is starting Imodium and Lomotil for high output colostomy.  They do not feel that he has any surgical needs at this point.  Continue current plan of care per orders.   No charge.   Montey Hora, [facility] For pager details, please see AMION or use Epic chat  After 1900, please call [facility] for cross coverage needs 12/01/2020, 1:13 PM

## 2020-12-01 NOTE — H&P (Addendum)
NAME:  Thomas [name] MRN:  [id number] DOB:  [birth year] LOS: 0 ADMISSION DATE:  11/30/2020 DATE OF SERVICE:  12/01/2020  CHIEF COMPLAINT:  abdominal pain   HISTORY & PHYSICAL  History of Present Illness  This 65 y.o. African American male reformed smoker presented to the [facility] Department via EMS with complaints of abdominal pain, anorexia and weakness. The patient reports that he has been having intermittent abdominal pain for the last 2 weeks.  Pain became severe tonight.  Recently seen at [facility] for the same on 7/21.  He was treated with IV fluids and discharged home.  Tonight in the ER, he was treated with cefepime and "sepsis bolus" for code sepsis. He was reportedly febrile (101.1) and tachycardic on presentation. No hypotension. On exam, ER provider had concerns about abdominal pain that was out of proportion to findings.  Labs did reveal elevated lactate level with partially compensated anion gap metabolic acidosis on ABG.  Additionally, he was found to be hyperglycemic, pseudohyponatremic and in acute renal failure. Urine ketones negative. PCCM asked to admit to ICU by General Surgery for close observation (evaluation pending) due to concerns for mesenteric ischemia.  At the time of clinical interview, the patient is awake, alert and cooperative. He reports that he has abdominal pain that is worst after eating food. It is alleviated by not eating.  He also reports feeling better after starting on treatment in the ER.  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  He has recently started chemotherapy and reports that he had his 3rd cycle administered 3 days ago.     REVIEW OF SYSTEMS Constitutional: No weight loss. No night sweats. No fever. No chills. No fatigue. HEENT: No headaches, dysphagia, sore throat, otalgia, nasal congestion, PND CV:  No chest pain, orthopnea, PND, swelling in  lower extremities, palpitations GI:  anorexia, abdominal pain.  No nausea, vomiting, diarrhea, change in bowel pattern,  Resp: Nonproductive cough.  Intermittent exertional dyspnea.  No rest dyspnea, mucus, hemoptysis, wheezing  GU: intermittent dysuria.  No change in color of urine, no urgency or frequency.  No flank pain. MS:  diabetic neuropathy.  No joint pain or swelling. No myalgias,  No decreased range of motion.  Psych:  No change in mood or affect. No memory loss. Skin: no rash or lesions.   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   Dizziness    Dyspnea    pt states d/t weight   Epilepsy ([place])    takes Keppra daily   GERD (gastroesophageal reflux disease)    takes Omeprazole daily   Headache    Hyperlipidemia    takes Atorvastatin daily   Hypertension    takes Lotrel daily   Peripheral edema    takes Lasix daily   Peripheral neuropathy    takes Gabapentin daily   Pneumonia 3 yrs ago   hx of   Seizures (HCC)    Sleep apnea    Urinary frequency  Past Surgical History:  Procedure Laterality Date   BIOPSY  09/05/2020   Procedure: BIOPSY;  Surgeon: Irene Shipper, MD;  Location: [facility] ENDOSCOPY;  Service: Endoscopy;;   CARDIAC CATHETERIZATION  7 yrs ago   COLONOSCOPY WITH PROPOFOL N/A 09/05/2020   Procedure: COLONOSCOPY WITH PROPOFOL;  Surgeon: Irene Shipper, MD;  Location: [facility] ENDOSCOPY;  Service: Endoscopy;  Laterality: N/A;   CRANIOTOMY N/A 09/01/2016   Procedure: CRANIOTOMY TUMOR  LEFT PTERIONAL;  Surgeon: Ashok Pall, MD;  Location: [place];  Service: Neurosurgery;  Laterality: N/A;   cyst removed from chest       as a child   NO PAST SURGERIES     PARTIAL COLECTOMY N/A 09/10/2020   Procedure: TRANSVERSE COLECTOMY;  Surgeon: [name], MD;  Location: [place];  Service: General;  Laterality: N/A;   POLYPECTOMY  09/05/2020   Procedure: POLYPECTOMY;  Surgeon: Irene Shipper, MD;  Location: [facility] ENDOSCOPY;  Service: Endoscopy;;   PORTACATH PLACEMENT Right 10/01/2020   Procedure: INSERTION PORT-A-CATH;  Surgeon: [name], MD;  Location: [place];  Service: General;  Laterality: Right;   SUBMUCOSAL TATTOO INJECTION  09/05/2020   Procedure: SUBMUCOSAL TATTOO INJECTION;  Surgeon: Irene Shipper, MD;  Location: [facility] ENDOSCOPY;  Service: Endoscopy;;    Social History   Tobacco Use   Smoking status: Former   Smokeless tobacco: Never   Tobacco comments:    quit smoking 35 yrs ago  Substance Use Topics   Alcohol use: No    Family History  Problem Relation Age of Onset   Breast cancer Mother    Breast cancer Maternal Aunt    Prostate cancer Maternal Uncle      Procedures:     Significant Diagnostic Tests:     Micro Data:   Results for orders placed or performed during the hospital encounter of 11/30/20  Resp Panel by RT-PCR (Flu A&B, Covid) Nasopharyngeal Swab     Status: None   Collection Time: 11/30/20 10:05 PM   Specimen: Nasopharyngeal Swab; Nasopharyngeal(NP) swabs in vial transport medium  Result Value Ref Range Status   SARS Coronavirus 2 by RT PCR NEGATIVE NEGATIVE Final    Comment: (NOTE) SARS-CoV-2 target nucleic acids are NOT DETECTED.  The SARS-CoV-2 RNA is generally detectable in upper respiratory specimens during the acute phase of infection. The lowest concentration of SARS-CoV-2 viral copies this assay can detect is 138 copies/mL. A negative result does not preclude SARS-Cov-2 infection and should not be used as the sole basis for treatment or other patient management decisions. A negative result may occur with  improper specimen collection/handling, submission of  specimen other than nasopharyngeal swab, presence of viral mutation(s) within the areas targeted by this assay, and inadequate number of viral copies(<138 copies/mL). A negative result must be combined with clinical observations, patient history, and epidemiological information. The expected result is Negative.  Fact Sheet for Patients:  EntrepreneurPulse.com.au  Fact Sheet for Healthcare Providers:  IncredibleEmployment.be  This test is no t yet approved or cleared by the [place] FDA and  has been authorized for detection and/or diagnosis of SARS-CoV-2 by FDA under an Emergency Use Authorization (EUA). This EUA will remain  in effect (meaning this test can be used) for the duration of the COVID-19 declaration under Section 564(b)(1) of the Act, 21 U.S.C.section 360bbb-3(b)(1), unless the authorization is terminated  or revoked sooner.       Influenza A by PCR NEGATIVE NEGATIVE Final   Influenza B by PCR  NEGATIVE NEGATIVE Final    Comment: (NOTE) The Xpert Xpress SARS-CoV-2/FLU/RSV plus assay is intended as an aid in the diagnosis of influenza from Nasopharyngeal swab specimens and should not be used as a sole basis for treatment. Nasal washings and aspirates are unacceptable for Xpert Xpress SARS-CoV-2/FLU/RSV testing.  Fact Sheet for Patients: EntrepreneurPulse.com.au  Fact Sheet for Healthcare Providers: IncredibleEmployment.be  This test is not yet approved or cleared by the [place] FDA and has been authorized for detection and/or diagnosis of SARS-CoV-2 by FDA under an Emergency Use Authorization (EUA). This EUA will remain in effect (meaning this test can be used) for the duration of the COVID-19 declaration under Section 564(b)(1) of the Act, 21 U.S.C. section 360bbb-3(b)(1), unless the authorization is terminated or revoked.  Performed at [facility], [place]  [address], [place], [place] [address]       Antimicrobials:  Cefepime (7/25>>)    Interim history/subjective:     Objective   BP (!) 145/80   Pulse 76   Temp 98.3 F (36.8 C) (Rectal)   Resp 20   SpO2 99%     There were no vitals filed for this visit. No intake or output data in the 24 hours ending 12/01/20 0307      Examination: GENERAL:  alert, oriented to time, person and place, pleasant, obese. No acute distress. HEAD: normocephalic, atraumatic EYE: PERRLA, EOM intact, no scleral icterus, no pallor. NOSE: nares are patent. No polyps. No exudate. No sinus tenderness. THROAT/ORAL CAVITY: Normal dentition. No oral thrush. No exudate. Mucous membranes are dry. No tonsillar enlargement. Mallampati class III (soft and hard palate and base of uvula visible) airway. NECK: supple, no thyromegaly, no JVD, no lymphadenopathy. Trachea midline. CHEST/LUNG: symmetric in development and expansion. Good air entry. No crackles. No wheezes. HEART: Regular S1 and S2 without murmur, rub or gallop. ABDOMEN: soft, mild tenderness diffusely, nondistended. Normoactive bowel sounds. No rebound. No guarding. No hepatosplenomegaly.  Ostomy is pink with normal appearing output. EXTREMITIES: Edema: Trace. No cyanosis. 2+ DP pulses LYMPHATIC: no cervical/axillary/inguinal lymph nodes appreciated MUSCULOSKELETAL: No point tenderness. No bulk atrophy. Joints: normal inspection.  SKIN:  Dry skin.  No rash or lesions. NEUROLOGIC: Doll's eyes intact. Corneal reflex intact. Spontaneous respirations intact. Cranial nerves II-XII are grossly symmetric and physiologic. Babinski absent. Motor: 5/5 @ RUE, 5/5 @ LUE, 5/5 @ RLL,  5/5 @ LLL.  DTR: 2+ @ R biceps, 2+ @ L biceps, 2+ @ R patellar,  2+ @ L patellar. No cerebellar signs. Gait was not assessed.   Resolved Hospital Problem list      Assessment & Plan:   ASSESSMENT/PLAN:  ASSESSMENT (included in the Hospital Problem List)  Principal Problem:    Diabetes mellitus with hyperosmolarity without hyperglycemic hyperosmolar nonketotic coma (HCC) Active Problems:   Acute renal failure superimposed on stage 2 chronic kidney disease (HCC)   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 (HCC)   Tachycardia   Adverse effect of chemotherapy   Lactic acidosis   By systems: PULMONARY: No acute issues Supplemental oxygen as needed to maintain SpO2 93+%.   CARDIOVASCULAR Tachycardia Unclear etiology Continue IV fluids for now Pain control   RENAL Acute on chronic stage II kidney disease Avoid nephrotoxic drugs Renal dosing of medications Monitor urine output Check urine drug screen   GASTROINTESTINAL Abdominal pain, unclear etiology: likely adverse reaction to chemotherapy regimen; doubt mesenteric ischemia Colon cancer, stage IIIB GI PROPHYLAXIS: Protonix Awaiting input from general surgery Check  lipase May need GI evaluation for postprandial abdominal pain   HEMATOLOGIC: No acute issues DVT PROPHYLAXIS: heparin Check uric acid   INFECTIOUS 10% bandemia With downtrending WBC count (toward neutropenia) and 10% bandemia, this is clinically concerning for brewing infection (particularly Gram negative), although an infectious focus remains elusive. Continue cefepime for now.   ENDOCRINE Hyperosmolar nonketosis Type 2 diabetes mellitus IV fluids Insulin infusion   NEUROLOGIC Diabetic neuropathy Continue gabapentin   PLAN/RECOMMENDATIONS  Admit to ICU under my service (Attending: Renee Pain, MD) with the diagnoses highlighted above in the active Hospital Problem List (ASSESSMENT). IV fluids: LR @ 150 mL/hr See plan above and orders.    My assessment, plan of care, findings, medications, side effects, etc. were discussed with: nurse.   Best practice:  Diet: NPO for now Pain/Anxiety/Delirium protocol (if indicated): N/A VAP protocol (if indicated): N/A DVT prophylaxis: heparin GI prophylaxis:  Protonix Glucose control: insulin gtt Mobility/Activity: bedrest   Code Status: Full Code Family Communication:   no family at bedside Disposition: admit to ICU   Labs   CBC: Recent Labs  Lab 11/27/20 2040 11/30/20 2205  WBC 5.4 4.8  HGB 11.6* 13.6  HCT 32.9* 38.4*  MCV 73.6* 75.1*  PLT 202 607    Basic Metabolic Panel: Recent Labs  Lab 11/27/20 2040 11/30/20 2205  NA 131* 133*  K 5.3* 5.9*  CL 101 104  CO2 16* 13*  GLUCOSE 262* 410*  BUN 52* 71*  CREATININE 1.60* 2.93*  CALCIUM 8.8* 9.2   GFR: Estimated Creatinine Clearance: 30.8 mL/min (A) (by C-G formula based on SCr of 2.93 mg/dL (H)). Recent Labs  Lab 11/27/20 2040 11/30/20 2205 12/01/20 0018  WBC 5.4 4.8  --   LATICACIDVEN  --  6.6* 6.8*    Liver Function Tests: Recent Labs  Lab 11/27/20 2040 11/30/20 2205  AST 17 26  ALT 18 29  ALKPHOS 72 67  BILITOT 1.1 1.4*  PROT 6.7 6.8  ALBUMIN 3.7 3.3*   No results for input(s): LIPASE, AMYLASE in the last 168 hours. No results for input(s): AMMONIA in the last 168 hours.  ABG    Component Value Date/Time   PHART 7.459 (H) 09/01/2016 1707   PCO2ART 37.5 09/01/2016 1707   PO2ART 87.0 09/01/2016 1707   HCO3 14.0 (L) 12/01/2020 0018   TCO2 28 09/01/2016 1707   ACIDBASEDEF 11.0 (H) 12/01/2020 0018   O2SAT 59.3 12/01/2020 0018     Coagulation Profile: Recent Labs  Lab 11/30/20 2205  INR 1.5*    Cardiac Enzymes: No results for input(s): CKTOTAL, CKMB, CKMBINDEX, TROPONINI in the last 168 hours.  HbA1C: Hgb A1c MFr Bld  Date/Time Value Ref Range Status  10/02/2020 12:32 AM 7.4 (H) 4.8 - 5.6 % Final    Comment:    (NOTE)         Prediabetes: 5.7 - 6.4         Diabetes: >6.4         Glycemic control for adults with diabetes: <7.0   08/26/2016 02:33 PM 8.6 (H) 4.8 - 5.6 % Final    Comment:    (NOTE)         Pre-diabetes: 5.7 - 6.4         Diabetes: >6.4         Glycemic control for adults with diabetes: <7.0     CBG: No results  for input(s): GLUCAP in the last 168 hours.   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   Dizziness    Dyspnea    pt states d/t weight   Epilepsy ([place])    takes Keppra daily   GERD (gastroesophageal reflux disease)    takes Omeprazole daily   Headache    Hyperlipidemia    takes Atorvastatin daily   Hypertension    takes Lotrel daily   Peripheral edema    takes Lasix daily   Peripheral neuropathy    takes Gabapentin daily   Pneumonia 3 yrs ago   hx of   Seizures ([place])    Sleep apnea    Urinary frequency       Surgical History    Past Surgical History:  Procedure Laterality Date   BIOPSY  09/05/2020   Procedure: BIOPSY;  Surgeon: Irene Shipper, MD;  Location: [facility] ENDOSCOPY;  Service: Endoscopy;;   CARDIAC CATHETERIZATION  7 yrs ago   COLONOSCOPY WITH PROPOFOL N/A 09/05/2020   Procedure: COLONOSCOPY WITH PROPOFOL;  Surgeon: Irene Shipper, MD;  Location: [facility] ENDOSCOPY;  Service: Endoscopy;  Laterality: N/A;   CRANIOTOMY N/A 09/01/2016   Procedure: CRANIOTOMY TUMOR  LEFT PTERIONAL;  Surgeon: Ashok Pall, MD;  Location: [place];  Service: Neurosurgery;  Laterality: N/A;   cyst removed from chest      as a child   NO PAST SURGERIES     PARTIAL COLECTOMY N/A 09/10/2020   Procedure: TRANSVERSE COLECTOMY;  Surgeon: [name], MD;  Location: [place];  Service: General;  Laterality: N/A;   POLYPECTOMY  09/05/2020   Procedure: POLYPECTOMY;  Surgeon: Irene Shipper, MD;  Location: [facility] ENDOSCOPY;  Service: Endoscopy;;   PORTACATH PLACEMENT Right 10/01/2020   Procedure: INSERTION PORT-A-CATH;  Surgeon: [name], MD;  Location: [place];  Service:  General;  Laterality: Right;   SUBMUCOSAL TATTOO INJECTION  09/05/2020   Procedure: SUBMUCOSAL TATTOO INJECTION;  Surgeon: Irene Shipper, MD;  Location[facility] ENDOSCOPY;  Service: Endoscopy;;      Social History   Social History   Socioeconomic History   Marital status: Married    Spouse name: Mahalia   Number of children: 4   Years of education: Not on file   Highest education level: Not on file  Occupational History   Occupation: Disabled  Tobacco Use   Smoking status: Former   Smokeless tobacco: Never   Tobacco comments:    quit smoking 35 yrs ago  Vaping Use   Vaping Use: Never used  Substance and Sexual Activity   Alcohol use: No   Drug use: No   Sexual activity: Not on file  Other Topics Concern   Not on file  Social History Narrative   Married to his wife, Jerlyn Ly with total of #4 children (#2 with current wife). Lives in home with his son and daughter-in-law.     Social Determinants of Health   Financial Resource Strain: Not on file  Food Insecurity: Not on file  Transportation Needs: Not on file  Physical Activity: Not on file  Stress: Not on file  Social Connections: Not on file      Family History  Family History  Problem Relation Age of Onset   Breast cancer Mother    Breast cancer Maternal Aunt    Prostate cancer Maternal Uncle    family history includes Breast cancer in his maternal aunt and mother; Prostate cancer in his maternal uncle.    Allergies No Known Allergies    Current Medications  Current Facility-Administered Medications:    [COMPLETED] diltiazem (CARDIZEM) 1 mg/mL load via infusion 20 mg, 20 mg, Intravenous, Once, 20 mg at 12/01/20 0149 **AND** diltiazem (CARDIZEM) 125 mg in dextrose 5% 125 mL (1 mg/mL) infusion, 5-15 mg/hr, Intravenous, Continuous, McDonald, Mia A, PA-C, Last Rate: 10 mL/hr at 12/01/20 0256, 10 mg/hr at 12/01/20 0256   lactated ringers infusion, , Intravenous, Continuous, McDonald, Mia A, PA-C, Last Rate: 150  mL/hr at 11/30/20 2252, New Bag at 11/30/20 2252   promethazine (PHENERGAN) 25 mg in sodium chloride 0.9 % 50 mL IVPB, 25 mg, Intravenous, Q6H PRN, Molpus, John, MD   vancomycin (VANCOREADY) IVPB 2000 mg/400 mL, 2,000 mg, Intravenous, Once, [place], Baltazar Najjar, [facility]  Current Outpatient Medications:    acetaminophen (TYLENOL) 500 MG tablet, Take 1,000 mg by mouth 3 (three) times daily as needed for headache (pain)., Disp: , Rfl:    albuterol (PROVENTIL HFA;VENTOLIN HFA) 108 (90 BASE) MCG/ACT inhaler, Inhale 2 puffs into the lungs every 4 (four) hours as needed for wheezing or shortness of breath., Disp: 1 Inhaler, Rfl: 1   amLODipine (NORVASC) 10 MG tablet, Take 10 mg by mouth daily., Disp: , Rfl:    capecitabine (XELODA) 500 MG tablet, Take 3 tablets (1,500 mg total) by mouth 2 (two) times daily after a meal., Disp: 84 tablet, Rfl:    gabapentin (NEURONTIN) 300 MG capsule, Take 1 capsule (300 mg total) by mouth 3 (three) times daily., Disp: 90 capsule, Rfl: 1   levETIRAcetam (KEPPRA) 1000 MG tablet, Take 1,000 mg by mouth 2 (two) times daily., Disp: , Rfl: 0   metFORMIN (GLUCOPHAGE-XR) 500 MG 24 hr tablet, Take 500 mg by mouth 2 (two) times daily., Disp: , Rfl:    omeprazole (PRILOSEC) 20 MG capsule, Take 20 mg by mouth daily., Disp: , Rfl: 0   atorvastatin (LIPITOR) 20 MG tablet, Take 20 mg by mouth daily at 6 PM., Disp: , Rfl:    lidocaine-prilocaine (EMLA) cream, Apply 1 application topically as directed. Apply 1/2 tablespoon to port site 2 hours prior to stick and cover with plastic wrap to numb site (Patient not taking: Reported on 10/08/2020), Disp: 30 g, Rfl: 2   ondansetron (ZOFRAN) 8 MG tablet, Take 1 tablet (8 mg total) by mouth every 8 (eight) hours as needed for nausea or vomiting. (Patient not taking: No sig reported), Disp: 30 tablet, Rfl: 1   prochlorperazine (COMPAZINE) 10 MG tablet, Take 1 tablet (10 mg total) by mouth every 6 (six) hours as needed. (Patient not taking: No sig  reported), Disp: 60 tablet, Rfl: 1   Home Medications  Prior to Admission medications   Medication Sig Start Date End Date Taking? Authorizing Provider  acetaminophen (TYLENOL) 500 MG tablet Take 1,000 mg by mouth 3 (three) times daily as needed for headache (pain).   Yes [provider]  albuterol (PROVENTIL HFA;VENTOLIN HFA) 108 (90 BASE) MCG/ACT inhaler Inhale 2 puffs into the lungs every 4 (four) hours as needed for wheezing or shortness of breath. 04/09/15  Yes Muthersbaugh, Jarrett Soho, PA-C  amLODipine (NORVASC) 10 MG tablet Take 10 mg by mouth daily. 05/11/20  Yes [provider]  capecitabine (XELODA) 500 MG tablet Take 3 tablets (1,500 mg total) by mouth 2 (two) times daily after a meal. 11/19/20  Yes Ladell Pier, MD  gabapentin (NEURONTIN) 300 MG capsule Take 1 capsule (300 mg total) by mouth 3 (three) times daily. 04/06/16  Yes Varney Biles, MD  levETIRAcetam (KEPPRA) 1000 MG tablet Take 1,000 mg by mouth 2 (two) times daily. 07/28/16  Yes [provider]  metFORMIN (GLUCOPHAGE-XR) 500 MG 24 hr tablet Take 500 mg by mouth 2 (two) times daily. 07/27/20  Yes [provider]  omeprazole (PRILOSEC) 20 MG capsule Take 20 mg by mouth daily. 08/10/16  Yes [provider]  atorvastatin (LIPITOR) 20 MG tablet Take 20 mg by mouth daily at 6 PM.    [provider]  lidocaine-prilocaine (EMLA) cream Apply 1 application topically as directed. Apply 1/2 tablespoon to port site 2 hours prior to stick and cover with plastic wrap to numb site Patient not taking: Reported on 10/08/2020 09/24/20   Ladell Pier, MD  ondansetron (ZOFRAN) 8 MG tablet Take 1 tablet (8 mg total) by mouth every 8 (eight) hours as needed for nausea or vomiting. Patient not taking: No sig reported 09/24/20   Ladell Pier, MD  prochlorperazine (COMPAZINE) 10 MG tablet Take 1 tablet (10 mg total) by mouth every 6 (six) hours as needed. Patient not taking: No sig reported  09/24/20   Ladell Pier, MD     Critical care time: 60 minutes.  The treatment and management of the patient's condition was required based on the threat of imminent deterioration. This time reflects time spent by the physician evaluating, providing care and managing the critically ill patient's care. The time was spent at the immediate bedside (or on the same floor/unit and dedicated to this patient's care). Time involved in separately billable procedures is NOT included int he critical care time indicated above. Family meeting and update time may be included above if and only if the patient is unable/incompetent to participate in clinical interview and/or decision making, and the discussion was necessary to determining treatment decisions.   Renee Pain, MD Board Certified by the ABIM, [place]

## 2020-12-01 NOTE — Progress Notes (Signed)
Progress Note: General Surgery Service   Chief Complaint/Subjective: Thomas [name] continues to have severe abdominal pain.  He has had multiple watery bowel movements via his ostomy bag.  His family says that he has been having massive amounts of watery stool via his ostomy for about the last week.  Objective: Vital signs in last 24 hours: Temp:  [97.5 F (36.4 C)-101.1 F (38.4 C)] 97.8 F (36.6 C) (07/25 1200) Pulse Rate:  [74-118] 85 (07/25 1100) Resp:  [16-26] 18 (07/25 1100) BP: (111-165)/(63-116) 138/74 (07/25 1100) SpO2:  [96 %-100 %] 99 % (07/25 1100) Weight:  [111.8 kg] 111.8 kg (07/25 0825) Last BM Date: 12/01/20  Intake/Output from previous day: No intake/output data recorded. Intake/Output this shift: Total I/O In: 628.7 [I.V.:128.7; IV Piggyback:500] Out: 975 [Urine:725; Stool:250]  Constitutional: NAD; conversant; no deformities Eyes: Moist conjunctiva; no lid lag; anicteric; PERRL Neck: Trachea midline; no thyromegaly Lungs: Normal respiratory effort; no tactile fremitus CV: RRR; no palpable thrills; no pitting edema GI: Abd soft, though he complains of abdominal pain, he says palpation of the abdomen makes it no worse and he appears nontender on exam.;  Ostomy with thin stool filling appliance MSK: Normal range of motion of extremities; no clubbing/cyanosis Psychiatric: Appropriate affect; alert and oriented x3 Lymphatic: No palpable cervical or axillary lymphadenopathy  Lab Results: CBC  Recent Labs    11/30/20 2205 12/01/20 0432  WBC 4.8 4.1  HGB 13.6 11.7*  HCT 38.4* 33.1*  PLT 265 212   BMET Recent Labs    11/30/20 2205 12/01/20 0432  NA 133* 132*  K 5.9* 5.1  CL 104 106  CO2 13* 15*  GLUCOSE 410* 306*  BUN 71* 58*  CREATININE 2.93* 2.12*  CALCIUM 9.2 8.2*   PT/INR Recent Labs    11/30/20 2205  LABPROT 17.7*  INR 1.5*   ABG Recent Labs    12/01/20 0018  HCO3 14.0*    Anti-infectives: Anti-infectives (From admission, onward)     Start     Dose/Rate Route Frequency Ordered Stop   12/01/20 1000  ceFEPIme (MAXIPIME) 2 g in sodium chloride 0.9 % 100 mL IVPB        2 g 200 mL/hr over 30 Minutes Intravenous Every 12 hours 12/01/20 0513     12/01/20 0300  vancomycin (VANCOREADY) IVPB 2000 mg/400 mL        2,000 mg 200 mL/hr over 120 Minutes Intravenous  Once 12/01/20 0255 12/01/20 0528   11/30/20 2245  ceFEPIme (MAXIPIME) 2 g in sodium chloride 0.9 % 100 mL IVPB        2 g 200 mL/hr over 30 Minutes Intravenous STAT 11/30/20 2237 12/01/20 0003       Medications: Scheduled Meds:  chlorhexidine  15 mL Mouth Rinse BID   Chlorhexidine Gluconate Cloth  6 each Topical Daily   heparin  5,000 Units Subcutaneous Q8H   insulin aspart  0-15 Units Subcutaneous Q4H   mouth rinse  15 mL Mouth Rinse q12n4p   Continuous Infusions:  ceFEPime (MAXIPIME) IV Stopped (12/01/20 0946)   dextrose 5% lactated ringers 125 mL/hr at 12/01/20 0555   lactated ringers Stopped (12/01/20 0425)   lactated ringers Stopped (12/01/20 0555)   lactated ringers 75 mL/hr at 12/01/20 1211   levETIRAcetam 750 mg (12/01/20 1219)   promethazine (PHENERGAN) injection (IM or IVPB)     PRN Meds:.dextrose, fentaNYL (SUBLIMAZE) injection, promethazine (PHENERGAN) injection (IM or IVPB)  Assessment/Plan: Thomas [name] is a 65 year old male who presents with abdominal pain.  He had a recent a sending colostomy and resection of obstructing colon cancer and is currently undergoing chemotherapy.  He has had very high output of thin watery stool at home for about a week now.  He presented severely dehydrated, and is responding to resuscitation with a clearing lactic acid in the hospital on the medicine service.  CT scan does not demonstrate any surgical complications at this time.  He is likely dehydrated due to the chemotherapy and his anatomy causing high ostomy output.  High output colostomy -Start Imodium and Lomotil and titrate to effect. -Continue  resuscitation and care by the medical team    LOS: 0 days    Felicie Morn, MD  [facility] Use [email or web address] to contact on call provider

## 2020-12-01 NOTE — ED Notes (Signed)
Under advice of Seong-Joo MD, pt drank a small amount of water. Pt immediately complained of 10/10 pain. MD notified. Pt advised that he should not drink any more water.

## 2020-12-01 NOTE — ED Notes (Signed)
Pt NPO per McDonald PA.

## 2020-12-01 NOTE — Progress Notes (Addendum)
Called to bedside to evaluate patient with A. fib with RVR Cardizem bolus and drip re-initiated  Fluid bolus given for decreased urine output  Appreciate surgery input  CT chest abdomen pelvis reviewed

## 2020-12-01 NOTE — Progress Notes (Signed)
RT tried twice to obtain ABG but was not successful. Pt said he is in lot of pain. RN aware and we will get venous blood gas.

## 2020-12-01 NOTE — Progress Notes (Signed)
Inpatient Diabetes Program Recommendations  AACE/ADA: New Consensus Statement on Inpatient Glycemic Control (2015)  Target Ranges:  Prepandial:   less than 140 mg/dL      Peak postprandial:   less than 180 mg/dL (1-2 hours)      Critically ill patients:  140 - 180 mg/dL   Lab Results  Component Value Date   GLUCAP 176 (H) 12/01/2020   HGBA1C 7.4 (H) 10/02/2020    Review of Glycemic Control  Diabetes history: DM2 Outpatient Diabetes medications: metformin 500 mg BID Current orders for Inpatient glycemic control: Transitioned off drip to Novolog 0-15 units Q4H  HgbA1C - 7.4% Pt is NPO May need small amount of basal insulin  Inpatient Diabetes Program Recommendations:    Add Lantus 10 units Q24H  Continue to follow.  Thank you. Lorenda Peck, RD, LDN, CDE Inpatient Diabetes Coordinator [phone]

## 2020-12-01 NOTE — Progress Notes (Signed)
On cardizem drip  Receiving fluid bolus for labile Bp  HR still in the 140s  Will give 150 amiodarone bolus

## 2020-12-01 NOTE — ED Notes (Addendum)
RN and NT changed pt's sheets and repositioned. Pt HR increased into the 150s-160s and cardiac monitor showed new onset afib with RVR. EKG shot and PA Mia McDonald notified. See new orders for Cardizem drip for rate control.

## 2020-12-02 ENCOUNTER — Other Ambulatory Visit (HOSPITAL_COMMUNITY): Payer: Self-pay

## 2020-12-02 ENCOUNTER — Inpatient Hospital Stay (HOSPITAL_COMMUNITY): Payer: Medicare HMO

## 2020-12-02 DIAGNOSIS — I4891 Unspecified atrial fibrillation: Secondary | ICD-10-CM | POA: Diagnosis not present

## 2020-12-02 DIAGNOSIS — E1100 Type 2 diabetes mellitus with hyperosmolarity without nonketotic hyperglycemic-hyperosmolar coma (NKHHC): Secondary | ICD-10-CM | POA: Diagnosis not present

## 2020-12-02 DIAGNOSIS — N179 Acute kidney failure, unspecified: Secondary | ICD-10-CM | POA: Diagnosis not present

## 2020-12-02 DIAGNOSIS — N182 Chronic kidney disease, stage 2 (mild): Secondary | ICD-10-CM

## 2020-12-02 DIAGNOSIS — A419 Sepsis, unspecified organism: Secondary | ICD-10-CM | POA: Diagnosis present

## 2020-12-02 DIAGNOSIS — R000 Tachycardia, unspecified: Secondary | ICD-10-CM

## 2020-12-02 LAB — C DIFFICILE QUICK SCREEN W PCR REFLEX
C Diff antigen: NEGATIVE
C Diff interpretation: NOT DETECTED
C Diff toxin: NEGATIVE

## 2020-12-02 LAB — CBC WITH DIFFERENTIAL/PLATELET
Abs Immature Granulocytes: 0.08 10*3/uL — ABNORMAL HIGH (ref 0.00–0.07)
Basophils Absolute: 0 10*3/uL (ref 0.0–0.1)
Basophils Relative: 1 %
Eosinophils Absolute: 0.2 10*3/uL (ref 0.0–0.5)
Eosinophils Relative: 4 %
HCT: 28.6 % — ABNORMAL LOW (ref 39.0–52.0)
Hemoglobin: 10.2 g/dL — ABNORMAL LOW (ref 13.0–17.0)
Immature Granulocytes: 2 %
Lymphocytes Relative: 35 %
Lymphs Abs: 1.5 10*3/uL (ref 0.7–4.0)
MCH: 26.6 pg (ref 26.0–34.0)
MCHC: 35.7 g/dL (ref 30.0–36.0)
MCV: 74.7 fL — ABNORMAL LOW (ref 80.0–100.0)
Monocytes Absolute: 0.7 10*3/uL (ref 0.1–1.0)
Monocytes Relative: 15 %
Neutro Abs: 1.9 10*3/uL (ref 1.7–7.7)
Neutrophils Relative %: 43 %
Platelets: 179 10*3/uL (ref 150–400)
RBC: 3.83 MIL/uL — ABNORMAL LOW (ref 4.22–5.81)
RDW: 20.3 % — ABNORMAL HIGH (ref 11.5–15.5)
WBC: 4.4 10*3/uL (ref 4.0–10.5)
nRBC: 0.7 % — ABNORMAL HIGH (ref 0.0–0.2)

## 2020-12-02 LAB — GLUCOSE, CAPILLARY
Glucose-Capillary: 199 mg/dL — ABNORMAL HIGH (ref 70–99)
Glucose-Capillary: 219 mg/dL — ABNORMAL HIGH (ref 70–99)
Glucose-Capillary: 224 mg/dL — ABNORMAL HIGH (ref 70–99)
Glucose-Capillary: 233 mg/dL — ABNORMAL HIGH (ref 70–99)
Glucose-Capillary: 235 mg/dL — ABNORMAL HIGH (ref 70–99)
Glucose-Capillary: 237 mg/dL — ABNORMAL HIGH (ref 70–99)
Glucose-Capillary: 245 mg/dL — ABNORMAL HIGH (ref 70–99)

## 2020-12-02 LAB — ECHOCARDIOGRAM COMPLETE
Area-P 1/2: 3.1 cm2
Height: 68 in
S' Lateral: 2.1 cm
Weight: 3943.59 oz

## 2020-12-02 LAB — BASIC METABOLIC PANEL
Anion gap: 7 (ref 5–15)
BUN: 56 mg/dL — ABNORMAL HIGH (ref 8–23)
CO2: 19 mmol/L — ABNORMAL LOW (ref 22–32)
Calcium: 8.2 mg/dL — ABNORMAL LOW (ref 8.9–10.3)
Chloride: 111 mmol/L (ref 98–111)
Creatinine, Ser: 1.96 mg/dL — ABNORMAL HIGH (ref 0.61–1.24)
GFR, Estimated: 37 mL/min — ABNORMAL LOW (ref >60)
Glucose, Bld: 250 mg/dL — ABNORMAL HIGH (ref 70–99)
Potassium: 4.7 mmol/L (ref 3.5–5.1)
Sodium: 137 mmol/L (ref 135–145)

## 2020-12-02 LAB — MAGNESIUM: Magnesium: 1.9 mg/dL (ref 1.7–2.4)

## 2020-12-02 LAB — PROCALCITONIN: Procalcitonin: 0.6 ng/mL

## 2020-12-02 LAB — HEMOGLOBIN A1C
Hgb A1c MFr Bld: 8.7 % — ABNORMAL HIGH (ref 4.8–5.6)
Mean Plasma Glucose: 203 mg/dL

## 2020-12-02 LAB — URINE CULTURE: Culture: NO GROWTH

## 2020-12-02 LAB — LACTIC ACID, PLASMA: Lactic Acid, Venous: 1.8 mmol/L (ref 0.5–1.9)

## 2020-12-02 MED ORDER — MAGNESIUM SULFATE IN D5W 1-5 GM/100ML-% IV SOLN
1.0000 g | Freq: Once | INTRAVENOUS | Status: AC
  Administered 2020-12-02: 1 g via INTRAVENOUS
  Filled 2020-12-02: qty 100

## 2020-12-02 MED ORDER — LORAZEPAM 2 MG/ML IJ SOLN
1.0000 mg | Freq: Four times a day (QID) | INTRAMUSCULAR | Status: DC | PRN
  Administered 2020-12-02 – 2020-12-04 (×4): 1 mg via INTRAVENOUS
  Filled 2020-12-02 (×4): qty 1

## 2020-12-02 MED ORDER — LACTATED RINGERS IV BOLUS
500.0000 mL | Freq: Once | INTRAVENOUS | Status: AC
  Administered 2020-12-02: 500 mL via INTRAVENOUS

## 2020-12-02 MED ORDER — ACETAMINOPHEN 500 MG PO TABS
1000.0000 mg | ORAL_TABLET | Freq: Three times a day (TID) | ORAL | Status: DC | PRN
  Administered 2020-12-02: 1000 mg via ORAL
  Filled 2020-12-02: qty 2

## 2020-12-02 MED ORDER — LORAZEPAM 2 MG/ML IJ SOLN: 1.0000 mg | Freq: Four times a day (QID) | INTRAMUSCULAR | Status: DC

## 2020-12-02 MED ORDER — INSULIN GLARGINE-YFGN 100 UNIT/ML ~~LOC~~ SOLN
10.0000 [IU] | Freq: Every day | SUBCUTANEOUS | Status: DC
  Administered 2020-12-02 – 2020-12-05 (×4): 10 [IU] via SUBCUTANEOUS
  Filled 2020-12-02 (×4): qty 0.1

## 2020-12-02 MED ORDER — GABAPENTIN 300 MG PO CAPS
300.0000 mg | ORAL_CAPSULE | Freq: Three times a day (TID) | ORAL | Status: DC
  Administered 2020-12-02 – 2020-12-04 (×9): 300 mg via ORAL
  Filled 2020-12-02 (×9): qty 1

## 2020-12-02 MED ORDER — MAGIC MOUTHWASH W/LIDOCAINE
5.0000 mL | Freq: Four times a day (QID) | ORAL | Status: DC
  Administered 2020-12-02 (×4): 5 mL via ORAL
  Filled 2020-12-02 (×5): qty 5

## 2020-12-02 MED ORDER — HYDROCODONE-ACETAMINOPHEN 7.5-325 MG/15ML PO SOLN
10.0000 mL | ORAL | Status: DC | PRN
  Administered 2020-12-02 – 2020-12-04 (×3): 10 mL via ORAL
  Filled 2020-12-02 (×3): qty 15

## 2020-12-02 MED ORDER — HYDROCERIN EX CREA
TOPICAL_CREAM | Freq: Two times a day (BID) | CUTANEOUS | Status: DC
  Administered 2020-12-05 – 2021-03-11 (×17): 1 via TOPICAL
  Filled 2020-12-02 (×7): qty 113

## 2020-12-02 MED ORDER — FENTANYL CITRATE (PF) 100 MCG/2ML IJ SOLN
100.0000 ug | Freq: Once | INTRAMUSCULAR | Status: AC
  Administered 2020-12-02: 100 ug via INTRAVENOUS
  Filled 2020-12-02: qty 2

## 2020-12-02 NOTE — Progress Notes (Addendum)
HEMATOLOGY-ONCOLOGY PROGRESS NOTE  SUBJECTIVE: Mr. Nigh is followed by our practice for colon cancer.  He is currently receiving adjuvant chemotherapy with CAPOX.  His dose was reduced at the last visit secondary to hand/foot syndrome and renal insufficiency.  He is now admitted with sepsis, A. fib with RVR, HSS which has now resolved, and abdominal pain.  This morning, he continues to have abdominal pain.  Pain located in the center of the abdomen.  He reports some mouth sores as well as his comfort to his hands and feet.  He denies nausea and vomiting today.  Continues to have high output through his ostomy.  Oncology History  Colon cancer ([place])  09/23/2020 Initial Diagnosis   Colon cancer ([place])    09/23/2020 Cancer Staging   Staging form: Colon and Rectum, AJCC 8th Edition - Pathologic: Stage IIIB (pT3, pN1a, cM0) - Signed by Ladell Pier, MD on 09/23/2020  Total positive nodes: 1  Histologic grading system: 4 grade system  Histologic grade (G): G1  Residual tumor (R): R0 - None  Lymph-vascular invasion (LVI): LVI not present (absent)/not identified  Tumor deposits (TD): Absent  Perineural invasion (PNI): Absent  Microsatellite instability (MSI): Stable    10/08/2020 -  Chemotherapy    Patient is on Treatment Plan: COLORECTAL XELOX (CAPEOX) Q21D         PHYSICAL EXAMINATION:  Vitals:   12/02/20 0645 12/02/20 0700  BP: 122/71 125/72  Pulse: (!) 131 (!) 123  Resp: (!) 22 (!) 23  Temp:    SpO2: 94% 94%   Filed Weights   12/01/20 0825  Weight: 111.8 kg    Intake/Output from previous day: 07/25 0701 - 07/26 0700 In: 3099.9 [I.V.:1344.9; IV Piggyback:1755] Out: 2120 [Urine:1295; Stool:825]  GENERAL:alert, no distress and comfortable SKIN: Dryness of the palms and soles, erythema with superficial desquamation and linear ulcers at the hands EYES: normal, Conjunctiva are pink and non-injected, sclera clear OROPHARYNX: He has several mouth sores over the  palate, tongue, and lower lip LUNGS: clear to auscultation and percussion with normal breathing effort HEART: Tachycardic, irregular ABDOMEN: Right abdomen colostomy, tender in the left greater than right lower abdomen NEURO: alert & oriented x 3 with fluent speech, no focal motor/sensory deficits  Port-A-Cath without erythema  LABORATORY DATA:  I have reviewed the data as listed CMP Latest Ref Rng & Units 12/02/2020 12/01/2020 12/01/2020  Glucose 70 - 99 mg/dL 250(H) 203(H) 306(H)  BUN 8 - 23 mg/dL 56(H) 56(H) 58(H)  Creatinine 0.61 - 1.24 mg/dL 1.96(H) 1.73(H) 2.12(H)  Sodium 135 - 145 mmol/L 137 136 132(L)  Potassium 3.5 - 5.1 mmol/L 4.7 4.5 5.1  Chloride 98 - 111 mmol/L 111 110 106  CO2 22 - 32 mmol/L 19(L) 19(L) 15(L)  Calcium 8.9 - 10.3 mg/dL 8.2(L) 8.3(L) 8.2(L)  Total Protein 6.5 - 8.1 g/dL - - -  Total Bilirubin 0.3 - 1.2 mg/dL - - -  Alkaline Phos 38 - 126 U/L - - -  AST 15 - 41 U/L - - -  ALT 0 - 44 U/L - - -    Lab Results  Component Value Date   WBC 4.4 12/02/2020   HGB 10.2 (L) 12/02/2020   HCT 28.6 (L) 12/02/2020   MCV 74.7 (L) 12/02/2020   PLT 179 12/02/2020   NEUTROABS 1.9 12/02/2020    DG Chest Port 1 View  Result Date: 11/30/2020 CLINICAL DATA:  Questionable sepsis. Patient began treatment for colon cancer 3 months ago. Loss of appetite, severe  abdominal pain, weakness, and back pain. EXAM: PORTABLE CHEST 1 VIEW COMPARISON:  11/27/2020 FINDINGS: Heart size and pulmonary vascularity are normal. Lungs clear. Power port type central venous catheter with tip over the low SVC region. No pneumothorax. No pleural effusions. Mediastinal contours appear intact. IMPRESSION: No active disease. Electronically Signed   By: Lucienne Capers M.D.   On: 11/30/2020 22:36   DG Chest Port 1 View  Result Date: 11/27/2020 CLINICAL DATA:  Weakness.  Fall today. EXAM: PORTABLE CHEST 1 VIEW COMPARISON:  Radiograph 11/15/2020 FINDINGS: Right chest port unchanged in position. Normal  heart size and mediastinal contours. No acute or focal airspace disease no pleural fluid, pulmonary edema, or pneumothorax. No acute osseous abnormalities are seen. IMPRESSION: No acute chest findings. Electronically Signed   By: Keith Rake M.D.   On: 11/27/2020 22:09   DG Chest Portable 1 View  Result Date: 11/15/2020 CLINICAL DATA:  Fatigue, colon cancer EXAM: PORTABLE CHEST 1 VIEW COMPARISON:  10/01/2020 FINDINGS: Right Port-A-Cath remains in place, unchanged. Heart and mediastinal contours are within normal limits. No focal opacities or effusions. No acute bony abnormality. IMPRESSION: No active disease. Electronically Signed   By: Rolm Baptise M.D.   On: 11/15/2020 14:34   CT CHEST ABDOMEN PELVIS WO CONTRAST  Result Date: 12/01/2020 CLINICAL DATA:  Abdominal pain and fever EXAM: CT CHEST, ABDOMEN AND PELVIS WITHOUT CONTRAST TECHNIQUE: Multidetector CT imaging of the chest, abdomen and pelvis was performed following the standard protocol without IV contrast. COMPARISON:  09/04/2020 FINDINGS: CT CHEST FINDINGS Cardiovascular: Calcific atherosclerosis of the aorta and coronary arteries. No pericardial effusion. Course and caliber of the aorta are normal. Right chest wall Port-A-Cath tip is at the cavoatrial junction. Mediastinum/Nodes: No enlarged mediastinal, hilar, or axillary lymph nodes. Thyroid gland, trachea, and esophagus demonstrate no significant findings. Lungs/Pleura: Lungs are clear. No pleural effusion or pneumothorax. Musculoskeletal: No chest wall mass or suspicious bone lesions identified. CT ABDOMEN PELVIS FINDINGS Hepatobiliary: No focal liver abnormality is seen. No gallstones, gallbladder wall thickening, or biliary dilatation. Pancreas: Unremarkable. No pancreatic ductal dilatation or surrounding inflammatory changes. Spleen: Normal in size without focal abnormality. Adrenals/Urinary Tract: Adrenal glands are unremarkable. Kidneys are normal, without renal calculi, focal lesion,  or hydronephrosis. Bladder is unremarkable. 3.5 cm left renal cyst. Stomach/Bowel: Normal appearance of the stomach and duodenum. No small bowel dilatation. 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 Status post partial colectomy. Vascular/Lymphatic: Aortic atherosclerosis. No enlarged abdominal or pelvic lymph nodes. Reproductive: Prostate is unremarkable. Other: Bilateral fat containing inguinal hernias. Musculoskeletal: No acute or significant osseous findings. IMPRESSION: 1. No acute abnormality of the chest, abdomen or pelvis. 2. Status post partial colectomy.  No evidence of obstruction. Aortic Atherosclerosis (ICD10-I70.0). Electronically Signed   By: Ulyses Jarred M.D.   On: 12/01/2020 02:20    ASSESSMENT AND PLAN: Transverse colon cancer, stage IIIb (T3N1a), status post a partial transverse colectomy and end colostomy 09/10/2020 CT abdomen/pelvis 09/04/2020- possible transverse colon mass with small regional lymph nodes Colonoscopy 09/05/2020- obstructing transverse colon mass-biopsy invasive adenocarcinoma, mass could not be passed CT chest 09/09/2020-no evidence of metastatic disease, 3 x 2 mm subpleural nodule in the right upper lobe likely benign subpleural lymph node Partial transverse colectomy 09/10/2020, transverse colon tumor, no lymphovascular perineural invasion, 1/19 lymph nodes positive, negative margins, MSI stable, no loss of mismatch repair protein expression Cycle 1 CAPOX 10/08/2020, oxaliplatin dose reduced to 100 mg per metered squared secondary to renal insufficiency Cycle 2 CAPOX 10/29/2020 Cycle 3 CAPOX 11/19/2020,  capecitabine dose reduced secondary to hand/foot syndrome, oxaliplatin dose reduced secondary to renal failure and poor performance status CT chest/abdomen/pelvis without contrast 12/01/2020-no acute abnormality in the chest, abdomen, or pelvis.  No evidence of obstruction. Resection of a frontal meningioma 09/01/2016 Diabetes Rectal polyp- tubular adenoma on colonoscopy  09/05/2020 Hypertension Diabetic neuropathy Epilepsy Family history of breast and prostate cancer Admission with acute renal failure 10/01/2020-secondary to lack of oral intake and lisinopril, improved with intravenous hydration and holding lisinopril Admission 12/01/2020-sepsis, HHS, A. fib with RVR, abdominal pain  Mr. Carreno has been admitted with sepsis, HHS, A. fib with RVR, and abdominal pain.  The HHS has now resolved.  The etiology of his sepsis is unclear.  He has been placed on IV antibiotics and cultures are being followed.  Etiology of his abdominal pain is unclear.  He has been seen by general surgery who recommends medical management including Lomotil and Imodium.  He has developed mucositis and hand/foot syndrome which is secondary to his chemotherapy.  Recommend supportive care.   Recommendations: 1.  Begin Magic mouthwash 4 times a day as needed 2.  We will also order hydrocodone elixir for pain. 3.  Keep hands and feet moisturized. 4.  Continue to follow cultures. 5.  Check stool for C. difficile toxin    LOS: 1 day   Mikey Bussing, DNP, AGPCNP-BC, AOCNP 12/02/20 Mr. Shampine was interviewed and examined.  He is known to me with a history of stage III colon cancer, currently completing adjuvant CAPOX chemotherapy.  He completed cycle 3 beginning on 11/19/2020.  He was admitted on 12/01/2020 with "sepsis syndrome ".  I suspect his clinical presentation is large related to toxicity from capecitabine.  He appears to have oral mucositis, hand/foot syndrome, and chemotherapy-induced diarrhea.  He also has a high output colostomy.  Cultures are negative to date.  No apparent source for infection.  We will plan for further dose reduction or discontinuation of capecitabine with the next planned cycle of chemotherapy.  I was present for greater than 50% of today's visit.  I performed medical decision making.

## 2020-12-02 NOTE — Progress Notes (Signed)
Initial Nutrition Assessment  DOCUMENTATION CODES:   Non-severe (moderate) malnutrition in context of chronic illness, Obesity unspecified  INTERVENTION:  - diet advancement as medically feasible. - will monitor for possible need for nutrition support.   NUTRITION DIAGNOSIS:   Moderate Malnutrition related to chronic illness, cancer and cancer related treatments as evidenced by mild fat depletion, mild muscle depletion, percent weight loss.  GOAL:   Patient will meet greater than or equal to 90% of their needs  MONITOR:   Diet advancement, Labs, Weight trends, I & O's, Other (Comment) (need for nutrition support)  REASON FOR ASSESSMENT:   Malnutrition Screening Tool  ASSESSMENT:   65 y.o. male who is a former with medical history of stage 3 colon cancer s/p partial colectomy and end colostomy on 5/4, recent chemo start with 3rd cycle on 7/22, sleep apnea, seizures, epilepsy, brain tumor, HTN, HLD, GERD, asthma, peripheral neuropathy, urinary frequency, DM, MCA stroke. He presented to the ED via EMS due to abdominal pain x2 weeks, anorexia, and weakness. Admitted for close monitoring for concern of mesenteric ischemia. Patient reported that abdominal pain was worse with eating PTA.  He has been NPO since admission. Patient is being followed by RD at the [facility] and their last phone conversation was on 7/15; reviewed note from that encounter.   Patient laying in bed with no family/visitors present. Spoke with RN briefly before entering patient's room.  Patient moans in response to all RD questions and is unable to provide any reliable information. He does fall asleep throughout visit, including during NFPE.  Weight yesterday was 246 lb and weight on 09/23/20 was 277 lb. This indicates 31 lb weight loss (11% body weight) in the past 2 months; significant for time frame.   Surgery is following. No plan for intervention at this time.  Per notes: - sepsis - hand-foot syndrome  d/t chemo - high ostomy output - AKI - afib with RVR - persistent severe abdominal pain   Labs reviewed; CBGs: 237, 233, 235, 245, 199 mg/dl, BUN: 56 ml/min, creatinine: 1.96 mg/dl, Ca: 8.2 mg/dl, GFR: 37 ml/min. Medications reviewed; sliding scale novolog, 10 units semglee/day, 1 g IV Mg sulfate x1 run 7/26, 2 mg imodium TID, 5 ml lomotil QID. IVF; LR @ 100 ml/hr.    NUTRITION - FOCUSED PHYSICAL EXAM:  Flowsheet Row Most Recent Value  Orbital Region Mild depletion  Upper Arm Region No depletion  Thoracic and Lumbar Region No depletion  Buccal Region No depletion  Temple Region Mild depletion  Clavicle Bone Region Mild depletion  Clavicle and Acromion Bone Region No depletion  Scapular Bone Region Unable to assess  Dorsal Hand No depletion  Patellar Region No depletion  Anterior Thigh Region No depletion  Posterior Calf Region Mild depletion  Edema (RD Assessment) Mild  [all extremities]  Hair Reviewed  Eyes Unable to assess  Mouth Unable to assess  Skin Reviewed  Nails Reviewed       Diet Order:   Diet Order             Diet NPO time specified  Diet effective now                   EDUCATION NEEDS:   Not appropriate for education at this time  Skin:  Skin Assessment: Skin Integrity Issues: Skin Integrity Issues:: Stage II Stage II: mid-coccyx  Last BM:  7/26 (type 7 x2 via colostomy--total of 525 ml)  Height:   Ht Readings from Last  1 Encounters:  12/01/20 5\' 8"  (1.727 m)    Weight:   Wt Readings from Last 1 Encounters:  12/01/20 111.8 kg     Estimated Nutritional Needs:  Kcal:  2350-2600 kcal Protein:  120-135 grams Fluid:  >/= 2.8 L/day     Jarome Matin, MS, RD, LDN, CNSC Inpatient Clinical Dietitian RD pager # available in AMION  After hours/weekend pager # available in [facility]

## 2020-12-02 NOTE — Progress Notes (Signed)
CC: Abdominal pain  Subjective: Patient complaining of severe abdominal pain, back pain, and neck pain.  He points to the midline of his abdomen as a source of pain currently.  His incisions healed nicely.  His ostomy is pink and looks good.  Liquid in the ostomy bag. Objective: Vital signs in last 24 hours: Temp:  [97.6 F (36.4 C)-99.3 F (37.4 C)] 99.3 F (37.4 C) (07/26 0800) Pulse Rate:  [33-142] 108 (07/26 0800) Resp:  [13-32] 16 (07/26 0800) BP: (76-160)/(48-84) 145/66 (07/26 0800) SpO2:  [92 %-100 %] 92 % (07/26 0800) Last BM Date: 12/02/20 3000 IV 1295 urine 825 stool Afebrile, tachycardia improving BP stable Sats stable. Pending 2.93>> 2.12>> 1.7>> 1.96 Sodium 137 Magnesium 1.9/potassium 4.7 WBC 4.4 H/H 13.6/38.4>> 11.7/33.1>> 10.2/28.6 Hemoglobin A1c 8.7 Intake/Output from previous day: 07/25 0701 - 07/26 0700 In: 3099.9 [I.V.:1344.9; IV Piggyback:1755] Out: 2120 [Urine:1295; Stool:825] Intake/Output this shift: Total I/O In: 280.2 [I.V.:280.2] Out: 150 [Stool:150]  General appearance: alert, cooperative, mild distress, and complaining of pain and asking for more pain medicine. Resp: Clear anterior Cardio: Tachycardic still in atrial fibrillation GI: Soft nondistended, ostomy looks good.  Bag is full of liquid stool. Midline incision is well-healed.  He does not have peritonitis  Lab Results:  Recent Labs    12/01/20 0432 12/02/20 0308  WBC 4.1 4.4  HGB 11.7* 10.2*  HCT 33.1* 28.6*  PLT 212 179    BMET Recent Labs    12/01/20 1226 12/02/20 0308  NA 136 137  K 4.5 4.7  CL 110 111  CO2 19* 19*  GLUCOSE 203* 250*  BUN 56* 56*  CREATININE 1.73* 1.96*  CALCIUM 8.3* 8.2*   PT/INR Recent Labs    11/30/20 2205  LABPROT 17.7*  INR 1.5*    Recent Labs  Lab 11/27/20 2040 11/30/20 2205  AST 17 26  ALT 18 29  ALKPHOS 72 67  BILITOT 1.1 1.4*  PROT 6.7 6.8  ALBUMIN 3.7 3.3*     Lipase     Component Value Date/Time   LIPASE 24  12/01/2020 0018     Medications:  chlorhexidine  15 mL Mouth Rinse BID   Chlorhexidine Gluconate Cloth  6 each Topical Daily   diphenoxylate-atropine  5 mL Oral QID   gabapentin  300 mg Oral TID   heparin  5,000 Units Subcutaneous Q8H   insulin aspart  0-15 Units Subcutaneous Q4H   insulin glargine-yfgn  10 Units Subcutaneous Daily   loperamide HCl  2 mg Oral TID   mouth rinse  15 mL Mouth Rinse q12n4p   Anti-infectives (From admission, onward)    Start     Dose/Rate Route Frequency Ordered Stop   12/01/20 1000  ceFEPIme (MAXIPIME) 2 g in sodium chloride 0.9 % 100 mL IVPB        2 g 200 mL/hr over 30 Minutes Intravenous Every 12 hours 12/01/20 0513     12/01/20 0300  vancomycin (VANCOREADY) IVPB 2000 mg/400 mL        2,000 mg 200 mL/hr over 120 Minutes Intravenous  Once 12/01/20 0255 12/01/20 0528   11/30/20 2245  ceFEPIme (MAXIPIME) 2 g in sodium chloride 0.9 % 100 mL IVPB        2 g 200 mL/hr over 30 Minutes Intravenous STAT 11/30/20 2237 12/01/20 0003        Assessment/Plan Abdominal pain Stage IIIb colon cancer, on chemotherapy S/P oratory laparotomy with transverse colectomy/colostomy 09/10/2020 Dr. Reather Laurence  -Plan: Continue medical  management.  He was started on Lomotil and Imodium yesterday.  FEN: N.p.o. ID: Maxipime 7/25>> day 2; vancomycin x1 7/25 DVT: Heparin/SCDs   Dehydration Atrial fibrillation  -Cardizem drip Type 2 diabetes Hypertension Hx epilepsy  -Keppra S/p craniotomy 2018 for meningioma        LOS: 1 day    Ramonica Grigg 12/02/2020 Please see Amion

## 2020-12-02 NOTE — Progress Notes (Signed)
Echocardiogram 2D Echocardiogram has been performed.  Oneal Deputy Ramata Strothman RDCS 12/02/2020, 3:29 PM

## 2020-12-02 NOTE — TOC Initial Note (Signed)
Transition of Care [facility]) - Initial/Assessment Note    Patient Details  Name: Thomas [name] MRN: [id number] Date of Birth: [birth year]  Transition of Care [facility]) CM/SW Contact:    Leeroy Cha, RN Phone Number: 12/02/2020, 8:55 AM  Clinical Narrative:                 65 y.o. African American male reformed smoker presented to the [facility] Emergency Department via EMS with complaints of abdominal pain, anorexia and weakness. The patient reports that he has been having intermittent abdominal pain for the last 2 weeks.  Pain became severe tonight.  Recently seen at [facility] ER for the same on 7/21.  He was treated with IV fluids and discharged home.   Tonight in the ER, he was treated with cefepime and "sepsis bolus" for code sepsis. He was reportedly febrile (101.1) and tachycardic on presentation. No hypotension. On exam, ER provider had concerns about abdominal pain that was out of proportion to findings.  Labs did reveal elevated lactate level with partially compensated anion gap metabolic acidosis on ABG.  Additionally, he was found to be hyperglycemic, pseudohyponatremic and in acute renal failure. Urine ketones negative. PCCM asked to admit to ICU by General Surgery for close observation (evaluation pending) due to concerns for mesenteric ischemia.  At the time of clinical interview, the patient is awake, alert and cooperative. He reports that he has abdominal pain that is worst after eating food. It is alleviated by not eating.  He also reports feeling better after starting on treatment in the ER. TOC PLAN OF CARE: Following for progression of care and toc needs. Expected Discharge Plan: Home/Self Care Barriers to Discharge: Continued Medical Work up   Patient Goals and CMS Choice Patient states their goals for this hospitalization and ongoing recovery are:: to go home CMS Medicare.gov Compare Post Acute Care list provided to:: Patient    Expected Discharge Plan and  Services Expected Discharge Plan: Home/Self Care   Discharge Planning Services: CM Consult   Living arrangements for the past 2 months: Single Family Home                                      Prior Living Arrangements/Services Living arrangements for the past 2 months: Single Family Home Lives with:: Spouse Patient language and need for interpreter reviewed:: Yes Do you feel safe going back to the place where you live?: Yes            Criminal Activity/Legal Involvement Pertinent to Current Situation/Hospitalization: No - Comment as needed  Activities of Daily Living Home Assistive Devices/Equipment: Gilford Rile (specify type) ADL Screening (condition at time of admission) Patient's cognitive ability adequate to safely complete daily activities?: No Is the patient deaf or have difficulty hearing?: No Does the patient have difficulty seeing, even when wearing glasses/contacts?: No Does the patient have difficulty concentrating, remembering, or making decisions?: Yes Patient able to express need for assistance with ADLs?: Yes Does the patient have difficulty dressing or bathing?: Yes Independently performs ADLs?: No Communication: Independent Dressing (OT): Needs assistance Is this a change from baseline?: Change from baseline, expected to last >3 days Grooming: Needs assistance Is this a change from baseline?: Change from baseline, expected to last >3 days Feeding: Needs assistance Is this a change from baseline?: Change from baseline, expected to last >3 days Bathing: Needs assistance Is this a change from baseline?:  Change from baseline, expected to last >3 days Toileting: Needs assistance Is this a change from baseline?: Change from baseline, expected to last >3days In/Out Bed: Needs assistance Is this a change from baseline?: Change from baseline, expected to last >3 days Walks in Home: Needs assistance Is this a change from baseline?: Change from baseline, expected  to last >3 days Does the patient have difficulty walking or climbing stairs?: Yes Weakness of Legs: Both Weakness of Arms/Hands: Both  Permission Sought/Granted                  Emotional Assessment Appearance:: Appears stated age     Orientation: : Oriented to Self, Oriented to Place, Oriented to  Time, Oriented to Situation Alcohol / Substance Use: Not Applicable Psych Involvement: No (comment)  Admission diagnosis:  Abdominal pain [R10.9] 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 ([place]) [E11.00] Patient Active Problem List   Diagnosis Date Noted   Atrial fibrillation with RVR (HCC)    Sepsis ([place])    Tachycardia 12/01/2020   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 ([place]) 12/01/2020   Adverse effect of chemotherapy 12/01/2020   Pressure injury of skin 12/01/2020   Acute renal failure superimposed on stage 2 chronic kidney disease ([place]) 10/01/2020   Hyponatremia 10/01/2020   Iron deficiency anemia due to chronic blood loss 48/27/0786   Metabolic acidosis 75/44/9201   Colon cancer ([place]) 09/23/2020   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   History of ischemic left MCA stroke 2015 09/11/2020   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    Rectal polyp    Diverticulosis of colon without hemorrhage    Abdominal pain 09/04/2020   Diabetic polyneuropathy associated with type 2 diabetes mellitus ([place]) 06/28/2018   Venous insufficiency of both lower extremities 06/28/2018   S/P craniotomy 09/01/2016   Meningioma ([place]) 09/01/2016   Trauma    Diabetes mellitus with hyperosmolarity without hyperglycemic hyperosmolar nonketotic coma ([place])    Essential hypertension    Acute encephalopathy 04/09/2014   Lactic acidosis 04/09/2014   Hyperammonemia ([place]) 04/09/2014   PCP:  [name], NP Pharmacy:   [facility] [place], [place] [address] Phone: [phone] Fax: [place] [address] [place] [address] Phone: [phone] Fax: [phone]     Social Determinants of Health (SDOH) Interventions    Readmission Risk Interventions No flowsheet data found.

## 2020-12-02 NOTE — Progress Notes (Signed)
Chaplain engaged in an initial visit with Avyon and his brother, Juanda Crumble.  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  His wife is currently a Theme park manager.  Juanda Crumble is the oldest of his siblings and has been somewhat of a father figure to [place].  Assad called out for Juanda Crumble before he was brought into the hospital.  Chaplain offered listening, presence and support as she got to know Juanda Crumble, learned about Crayton through his brother, and talked about faith.    12/02/20 1600  Clinical Encounter Type  Visited With Patient and family together  Visit Type Initial

## 2020-12-02 NOTE — Progress Notes (Signed)
Spoke with Dr. Chase Caller to let him know patient had converted back to NSR and rate is controlled in the 90's. Patient remains resting with no signs of distress. MD gave verbal order to stop Cardizem gtt.

## 2020-12-02 NOTE — Progress Notes (Signed)
NAME:  ONDRA DEBOARD MRN:  [id number] DOB:  1956-01-06 LOS: 1 ADMISSION DATE:  11/30/2020 DATE OF SERVICE:  12/01/2020  CHIEF COMPLAINT:  abdominal pain   HISTORY & PHYSICAL  History of Present Illness  This 65 y.o. African American male reformed smoker presented to the [facility] Emergency Department via EMS with complaints of abdominal pain, anorexia and weakness. The patient reports that he has been having intermittent abdominal pain for the last 2 weeks.  Pain became severe tonight.  Recently seen at [facility] ER for the same on 7/21.  He was treated with IV fluids and discharged home.  Tonight in the ER, he was treated with cefepime and "sepsis bolus" for code sepsis. He was reportedly febrile (101.1) and tachycardic on presentation. No hypotension. On exam, ER provider had concerns about abdominal pain that was out of proportion to findings.  Labs did reveal elevated lactate level with partially compensated anion gap metabolic acidosis on ABG.  Additionally, he was found to be hyperglycemic, pseudohyponatremic and in acute renal failure. Urine ketones negative. PCCM asked to admit to ICU by General Surgery for close observation (evaluation pending) due to concerns for mesenteric ischemia.  At the time of clinical interview, the patient is awake, alert and cooperative. He reports that he has abdominal pain that is worst after eating food. It is alleviated by not eating.  He also reports feeling better after starting on treatment in the ER.  Of note, he has a history of transverse colon cancer stage IIIb ([id number]) s/p partial transverse colectomy and end colostomy 09/10/20.  He has recently started chemotherapy and reports that he had his 3rd cycle administered 3 days ago.     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; Rectal polyp; Diverticulosis of colon without hemorrhage; Diabetic polyneuropathy associated with type 2 diabetes mellitus ([place]); Venous insufficiency of both lower extremities; Colostomy in place [facility]); History of ischemic left MCA stroke 2015; Colon cancer ([place]); Acute renal failure superimposed on stage 2 chronic kidney disease ([place]); Hyponatremia; Iron deficiency anemia due to chronic blood loss; Metabolic acidosis; Tachycardia; 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    Interim history/subjective:  Has had ongoing pain "all over".  Has required multiple fentanyl doses. Developed AFRVR yesterday and Cardizem restarted.  Also received amio bolus. Converted to NSR later that night but overnight back into AFRVR and cardizem back on. This AM, cardizem on 10 with HR in 120s.  Objective   BP (!) 145/66 (BP Location: Left Arm)   Pulse (!) 108   Temp 99.3 F (37.4 C) (Axillary)   Resp 16   Ht 5\' 8"  (1.727 m)   Wt 111.8 kg   SpO2 92%   BMI 37.48 kg/m     Filed Weights   12/01/20 0825  Weight: 111.8 kg    Intake/Output Summary (Last 24 hours) at 12/02/2020 0829 Last data filed at 12/02/2020 0754 Gross per 24 hour  Intake 3380.06 ml  Output 2270 ml  Net 1110.06 ml      Examination: General: Adult male, appears older then stated age, resting in bed, in NAD. Neuro: A&O x 3, no deficits but complains of "pain all over". HEENT: [place]/AT. Sclerae anicteric. EOMI. Cardiovascular:  Tachy, IRIR, no M/R/G.  Lungs: Respirations even and unlabored.  CTA bilaterally, No W/R/R. Abdomen: Ostomy appears pink and moist.  Roughly 500cc output overnight.  Abd soft, tenderness to deep palpation. Musculoskeletal: No gross deformities, no edema.  Skin:  Intact, warm, no rashes. GU: +1.1L net since admit.  Resolved Hospital Problem list      Assessment & Plan:   A.fib RVR - suspect exacerbated by pain + underlying sepsis. - Continue Cardizem for sustained HR > 115. - Treat pain. - Fluids.  Sepsis - unclear etiology. - Continue Cefepime. - Follow cultures.  Abdominal pain - low suspicion for ischemia per surgery. - Continue supportive care. - Resume home Acetaminophen, Gabapentin (per pt request).  High output ostomy. - Continue Lomotil, Loperamide.  HHS - resolved and now off insulin; through, CBG remains moderately high. - Continue SSI. - Add Lantus 10mg  daily per DM educator recs.  AoCKD. - Continue supportive care. - 500cc bolus to keep up with losses from high output ostomy.  Hx Seizures. - Continue home [place].   Best practice:  Diet: NPO for now Pain/Anxiety/Delirium protocol (if indicated): N/A VAP protocol (if indicated): N/A DVT prophylaxis: heparin GI prophylaxis: Protonix Glucose control: SSI Mobility/Activity: bedrest Code Status: Full Code Family Communication:   no family at bedside Disposition: ICU   CC time: 30 min.   Montey Hora, [place] For pager details, please see AMION or use Epic chat  After 1900, please call [facility] for cross coverage needs 12/02/2020, 8:49 AM

## 2020-12-02 NOTE — Progress Notes (Addendum)
eLink Physician-Brief Progress Note Patient Name: Thomas [name] DOB: [birth year] MRN: [id number]   Date of Service  12/02/2020  HPI/Events of Note  Patient is here with abdominal pain and high output from ostomy. He has been receiving fentanyl 100 mcg Q2H PRN. He is not yet due for a dose, however, and is in severe pain. RN requests additional 1x dose of pain medication.   eICU Interventions  Ordered fentanyl 100 mcg IV x1 dose now.  Will also obtain a repeat lactic acid given that the cause of his abdominal pain is still not fully elucidated.     Intervention Category Intermediate Interventions: Pain - evaluation and management  Charlott Rakes 12/02/2020, 5:49 AM

## 2020-12-03 DIAGNOSIS — E1100 Type 2 diabetes mellitus with hyperosmolarity without nonketotic hyperglycemic-hyperosmolar coma (NKHHC): Secondary | ICD-10-CM | POA: Diagnosis not present

## 2020-12-03 DIAGNOSIS — E872 Acidosis: Secondary | ICD-10-CM | POA: Diagnosis not present

## 2020-12-03 DIAGNOSIS — E440 Moderate protein-calorie malnutrition: Secondary | ICD-10-CM | POA: Diagnosis not present

## 2020-12-03 DIAGNOSIS — I4891 Unspecified atrial fibrillation: Secondary | ICD-10-CM | POA: Diagnosis not present

## 2020-12-03 LAB — BASIC METABOLIC PANEL
Anion gap: 7 (ref 5–15)
BUN: 48 mg/dL — ABNORMAL HIGH (ref 8–23)
CO2: 20 mmol/L — ABNORMAL LOW (ref 22–32)
Calcium: 8.3 mg/dL — ABNORMAL LOW (ref 8.9–10.3)
Chloride: 112 mmol/L — ABNORMAL HIGH (ref 98–111)
Creatinine, Ser: 1.44 mg/dL — ABNORMAL HIGH (ref 0.61–1.24)
GFR, Estimated: 54 mL/min — ABNORMAL LOW (ref >60)
Glucose, Bld: 217 mg/dL — ABNORMAL HIGH (ref 70–99)
Potassium: 4.4 mmol/L (ref 3.5–5.1)
Sodium: 139 mmol/L (ref 135–145)

## 2020-12-03 LAB — GLUCOSE, CAPILLARY
Glucose-Capillary: 192 mg/dL — ABNORMAL HIGH (ref 70–99)
Glucose-Capillary: 192 mg/dL — ABNORMAL HIGH (ref 70–99)
Glucose-Capillary: 196 mg/dL — ABNORMAL HIGH (ref 70–99)
Glucose-Capillary: 212 mg/dL — ABNORMAL HIGH (ref 70–99)
Glucose-Capillary: 218 mg/dL — ABNORMAL HIGH (ref 70–99)
Glucose-Capillary: 223 mg/dL — ABNORMAL HIGH (ref 70–99)

## 2020-12-03 LAB — PROCALCITONIN: Procalcitonin: 0.45 ng/mL

## 2020-12-03 LAB — CBC
HCT: 26.3 % — ABNORMAL LOW (ref 39.0–52.0)
Hemoglobin: 9.2 g/dL — ABNORMAL LOW (ref 13.0–17.0)
MCH: 26.4 pg (ref 26.0–34.0)
MCHC: 35 g/dL (ref 30.0–36.0)
MCV: 75.4 fL — ABNORMAL LOW (ref 80.0–100.0)
Platelets: 201 10*3/uL (ref 150–400)
RBC: 3.49 MIL/uL — ABNORMAL LOW (ref 4.22–5.81)
RDW: 20.2 % — ABNORMAL HIGH (ref 11.5–15.5)
WBC: 5.2 10*3/uL (ref 4.0–10.5)
nRBC: 1.2 % — ABNORMAL HIGH (ref 0.0–0.2)

## 2020-12-03 LAB — PHOSPHORUS: Phosphorus: 2.7 mg/dL (ref 2.5–4.6)

## 2020-12-03 LAB — MAGNESIUM: Magnesium: 2.3 mg/dL (ref 1.7–2.4)

## 2020-12-03 MED ORDER — DIPHENOXYLATE-ATROPINE 2.5-0.025 MG/5ML PO LIQD
10.0000 mL | Freq: Four times a day (QID) | ORAL | Status: DC
  Administered 2020-12-03 – 2020-12-04 (×7): 10 mL via ORAL
  Filled 2020-12-03 (×8): qty 10

## 2020-12-03 MED ORDER — DILTIAZEM HCL-DEXTROSE 125-5 MG/125ML-% IV SOLN (PREMIX)
5.0000 mg/h | INTRAVENOUS | Status: DC
Start: 2020-12-03 — End: 2020-12-03
  Administered 2020-12-03: 5 mg/h via INTRAVENOUS
  Filled 2020-12-03: qty 125

## 2020-12-03 MED ORDER — SODIUM CHLORIDE 0.9 % IV SOLN
2.0000 g | Freq: Three times a day (TID) | INTRAVENOUS | Status: DC
  Administered 2020-12-03 – 2020-12-04 (×4): 2 g via INTRAVENOUS
  Filled 2020-12-03 (×4): qty 2

## 2020-12-03 MED ORDER — MAGIC MOUTHWASH: 5.0000 mL | Freq: Four times a day (QID) | ORAL | Status: DC | PRN

## 2020-12-03 MED ORDER — DILTIAZEM HCL 30 MG PO TABS
30.0000 mg | ORAL_TABLET | Freq: Four times a day (QID) | ORAL | Status: DC
  Administered 2020-12-03 – 2020-12-05 (×10): 30 mg via ORAL
  Filled 2020-12-03 (×10): qty 1

## 2020-12-03 MED ORDER — SODIUM CHLORIDE 0.45 % IV SOLN: INTRAVENOUS | Status: DC

## 2020-12-03 MED ORDER — FENTANYL CITRATE (PF) 100 MCG/2ML IJ SOLN
50.0000 ug | INTRAMUSCULAR | Status: DC | PRN
  Administered 2020-12-03: 50 ug via INTRAVENOUS
  Filled 2020-12-03 (×2): qty 2

## 2020-12-03 MED ORDER — LEVETIRACETAM IN NACL 1000 MG/100ML IV SOLN
1000.0000 mg | Freq: Two times a day (BID) | INTRAVENOUS | Status: DC
  Administered 2020-12-03 – 2020-12-19 (×32): 1000 mg via INTRAVENOUS
  Filled 2020-12-03 (×33): qty 100

## 2020-12-03 NOTE — Progress Notes (Signed)
PROGRESS NOTE    Thomas [name]  WFU:[id number] DOB: 01-Mar-1956 DOA: 11/30/2020 PCP: [name], NP    Brief Narrative:  Mr. Hardgrove was admitted to the hospital with working diagnosis of sepsis syndrome or lactic acidosis complicated with atrial fibrillation with rapid ventricular response.  65 year old male who presented with abdominal pain, anorexia and weakness.  Intermittent symptoms for about 2 weeks, more severe over the last 12 hours.  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 He was diagnosed with sepsis syndrome and received broad-spectrum antibiotic therapy, transferred from [facility] ED to [facility].  Patient received supportive medical therapy including broad-spectrum antibiotic therapy. For his atrial fibrillation he required diltiazem and amiodarone.  Transferred to [facility] 7/27.  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 Patient has been hemodynamically stable. Blood pressure 105/59 with HR 96, afebrile. Wbc is 5,2. Cultures with no growth.  Chest radiograph with no infiltrates, positive Porth at the right IJ.   Will continue cefepime for 24 hrs more if no signs of systemic infection with discontinue.  Continue to follow up cell count, cultures and temperature curve.  Patient with immunosuppression due to chemotherapy.   2. Atrial  fibrillation with RVR. Rate better controlled with diltiazem. Will transition to PO 30 mg q6 hrs for now. Continue with telemetry monitoring.  Echocardiogram with preserved LF systolic function.  3. Colon cancer. Sp colostomy. Continue supportive medical care, follow up with Dr Learta Codding.  4. AKI on CKD stage 2. Renal function with serum cr at 1,44 with K at 4,4 and serum bicarbonate at 20. CL 112 Continue gentle hydration with half saline at 75 ml per hr.   5. T2DM continue glucose cover and monitoring with insulin sliding scale. Patient is tolerating po  Basal insulin with 10 units glargine   6. Seizures Continue with keppra.   7. Obesity class 2, calculated BMI is 37. Moderate calorie protein malnutrition.   Patient continue to be at high risk for worsening inflammatory syndrom  Status is: Inpatient  Remains inpatient appropriate because:Inpatient level of care appropriate due to severity of illness  Dispo: The patient is from: Home              Anticipated d/c is to: Home              Patient currently is not medically stable to d/c.   Difficult to place patient No   DVT prophylaxis: heparin   Code Status:   full  Family Communication:  No family at the bedside      Nutrition Status: Nutrition Problem: Moderate Malnutrition Etiology: chronic illness, cancer and cancer related treatments Signs/Symptoms: mild fat depletion, mild muscle depletion, percent weight loss Percent weight loss: 11 % Interventions: Refer to RD note for recommendations     Skin Documentation: Pressure Injury 12/01/20 Coccyx Mid Stage 2 -  Partial thickness loss of dermis presenting as a shallow open  injury with a red, pink wound bed without slough. stage 2 decubitus to coxxyx (Active)  12/01/20 0900  Location: Coccyx  Location Orientation: Mid  Staging: Stage 2 -  Partial thickness loss of dermis presenting as a shallow open injury with a red, pink wound bed without slough.  Wound Description  (Comments): stage 2 decubitus to coxxyx  Present on Admission: Yes     Consultants:  Oncology    Antimicrobials:  Cefepime     Subjective: Patient with no nausea or vomiting, positive somnolence but easy to arouse, no chest pain or dyspnea.   Objective: Vitals:   12/03/20 0700 12/03/20 0800 12/03/20 0803 12/03/20 0900  BP: (!) 105/58 (!) 159/28  (!) 105/59  Pulse:  (!) 101    Resp: (!) 21 (!) 24  (!) 21  Temp:   98.1 F (36.7 C)   TempSrc:   Axillary   SpO2: 98% 97%    Weight:      Height:        Intake/Output Summary (Last 24 hours) at 12/03/2020 0947 Last data filed at 12/03/2020 0736 Gross per 24 hour  Intake 2453.48 ml  Output 2750 ml  Net -296.52 ml   Filed Weights   12/01/20 0825  Weight: 111.8 kg    Examination:   General: Not in pain or dyspnea, deconditioned  Neurology: somnolent but easy to arouse.  E ENT: no pallor, no icterus, oral mucosa moist Cardiovascular: No JVD. S1-S2 present, rhythmic, no gallops, rubs, or murmurs. No lower extremity edema. Pulmonary: positive breath sounds bilaterally, adequate air movement, no wheezing, rhonchi or rales. Gastrointestinal. Abdomen mild distended but not tender, colostomy in place with with liquid diet. Skin. No rashes Musculoskeletal: no joint deformities     Data Reviewed: I have personally reviewed following labs and imaging studies  CBC: Recent Labs  Lab 11/27/20 2040 11/30/20 2205 12/01/20 0432 12/02/20 0308 12/03/20 0251  WBC 5.4 4.8 4.1 4.4 5.2  NEUTROABS  --   --   --  1.9  --   HGB 11.6* 13.6 11.7* 10.2* 9.2*  HCT 32.9* 38.4* 33.1* 28.6* 26.3*  MCV 73.6* 75.1* 75.2* 74.7* 75.4*  PLT 202 265 212 179 932   Basic Metabolic Panel: Recent Labs  Lab 11/30/20 2205 12/01/20 0432 12/01/20 1226 12/01/20 1549 12/02/20 0308 12/03/20 0251  NA 133* 132* 136  --  137 139  K 5.9* 5.1 4.5  --  4.7 4.4  CL 104 106 110  --  111 112*  CO2 13* 15* 19*  --  19* 20*  GLUCOSE 410* 306* 203*  --   250* 217*  BUN 71* 58* 56*  --  56* 48*  CREATININE 2.93* 2.12* 1.73*  --  1.96* 1.44*  CALCIUM 9.2 8.2* 8.3*  --  8.2* 8.3*  MG  --  1.7  --  2.1 1.9 2.3  PHOS  --   --   --   --   --  2.7   GFR: Estimated Creatinine Clearance: 62.9 mL/min (A) (by C-G formula based on SCr of 1.44 mg/dL (H)). Liver Function Tests: Recent Labs  Lab 11/27/20 2040 11/30/20 2205  AST 17 26  ALT 18 29  ALKPHOS 72 67  BILITOT 1.1 1.4*  PROT 6.7 6.8  ALBUMIN 3.7 3.3*   Recent Labs  Lab 12/01/20 0018  LIPASE 24   No results for input(s): AMMONIA in the last 168 hours. Coagulation Profile: Recent Labs  Lab 11/30/20 2205  INR 1.5*   Cardiac  Enzymes: No results for input(s): CKTOTAL, CKMB, CKMBINDEX, TROPONINI in the last 168 hours. BNP (last 3 results) No results for input(s): PROBNP in the last 8760 hours. HbA1C: Recent Labs    12/01/20 0432  HGBA1C 8.7*   CBG: Recent Labs  Lab 12/02/20 1523 12/02/20 1945 12/02/20 2317 12/03/20 0415 12/03/20 0806  GLUCAP 199* 224* 219* 196* 212*   Lipid Profile: No results for input(s): CHOL, HDL, LDLCALC, TRIG, CHOLHDL, LDLDIRECT in the last 72 hours. Thyroid Function Tests: No results for input(s): TSH, T4TOTAL, FREET4, T3FREE, THYROIDAB in the last 72 hours. Anemia Panel: No results for input(s): VITAMINB12, FOLATE, FERRITIN, TIBC, IRON, RETICCTPCT in the last 72 hours.    Radiology Studies: I have reviewed all of the imaging during this hospital visit personally     Scheduled Meds:  chlorhexidine  15 mL Mouth Rinse BID   Chlorhexidine Gluconate Cloth  6 each Topical Daily   diphenoxylate-atropine  5 mL Oral QID   gabapentin  300 mg Oral TID   heparin  5,000 Units Subcutaneous Q8H   hydrocerin   Topical BID   insulin aspart  0-15 Units Subcutaneous Q4H   insulin glargine-yfgn  10 Units Subcutaneous Daily   loperamide HCl  2 mg Oral TID   mouth rinse  15 mL Mouth Rinse q12n4p   Continuous Infusions:  sodium chloride 10 mL/hr  at 12/03/20 0444   sodium chloride Stopped (12/02/20 0308)   ceFEPime (MAXIPIME) IV 2 g (12/03/20 0840)   chlorproMAZINE (THORAZINE) IV Stopped (12/03/20 0544)   diltiazem (CARDIZEM) infusion 5 mg/hr (12/03/20 0444)   lactated ringers 100 mL/hr at 12/03/20 0444   levETIRAcetam 750 mg (12/03/20 0943)   promethazine (PHENERGAN) injection (IM or IVPB) Stopped (12/01/20 2020)     LOS: 2 days        Jasiah Buntin Gerome Apley, MD

## 2020-12-03 NOTE — Progress Notes (Signed)
HEMATOLOGY-ONCOLOGY PROGRESS NOTE  SUBJECTIVE: Thomas [name] reports feeling better today.  He has less chest and abdominal pain.  His wife is at the bedside. Oncology History  Colon cancer ([place])  09/23/2020 Initial Diagnosis   Colon cancer ([place])    09/23/2020 Cancer Staging   Staging form: Colon and Rectum, AJCC 8th Edition - Pathologic: Stage IIIB (pT3, pN1a, cM0) - Signed by Ladell Pier, MD on 09/23/2020  Total positive nodes: 1  Histologic grading system: 4 grade system  Histologic grade (G): G1  Residual tumor (R): R0 - None  Lymph-vascular invasion (LVI): LVI not present (absent)/not identified  Tumor deposits (TD): Absent  Perineural invasion (PNI): Absent  Microsatellite instability (MSI): Stable    10/08/2020 -  Chemotherapy    Patient is on Treatment Plan: COLORECTAL XELOX (CAPEOX) Q21D         PHYSICAL EXAMINATION:  Vitals:   12/03/20 0400 12/03/20 0700  BP: 119/67 (!) 105/58  Pulse:    Resp: 14 (!) 21  Temp: 98.2 F (36.8 C)   SpO2: 96% 98%   Filed Weights   12/01/20 0825  Weight: 246 lb 7.6 oz (111.8 kg)    Intake/Output from previous day: 07/26 0701 - 07/27 0700 In: 2733.6 [I.V.:2417.6; IV Piggyback:316] Out: 2700 [Urine:1175; Stool:1525]  GENERAL:alert, no distress and comfortable SKIN: Dryness of the palms and soles, erythema with superficial desquamation and linear ulcers at the hands, superficial skin breakdown at the scrotum EYES: normal, Conjunctiva are pink and non-injected, sclera clear OROPHARYNX: He has several mouth sores over the palate, tongue, and lower lip, no thrush LUNGS: clear to auscultation and percussion with normal breathing effort HEART: Tachycardic, regular ABDOMEN: Right abdomen colostomy with liquid stool NEURO: alert & oriented x 3 with fluent speech, no focal motor/sensory deficits  Port-A-Cath without erythema  LABORATORY DATA:  I have reviewed the data as listed CMP Latest Ref Rng & Units 12/03/2020  12/02/2020 12/01/2020  Glucose 70 - 99 mg/dL 217(H) 250(H) 203(H)  BUN 8 - 23 mg/dL 48(H) 56(H) 56(H)  Creatinine 0.61 - 1.24 mg/dL 1.44(H) 1.96(H) 1.73(H)  Sodium 135 - 145 mmol/L 139 137 136  Potassium 3.5 - 5.1 mmol/L 4.4 4.7 4.5  Chloride 98 - 111 mmol/L 112(H) 111 110  CO2 22 - 32 mmol/L 20(L) 19(L) 19(L)  Calcium 8.9 - 10.3 mg/dL 8.3(L) 8.2(L) 8.3(L)  Total Protein 6.5 - 8.1 g/dL - - -  Total Bilirubin 0.3 - 1.2 mg/dL - - -  Alkaline Phos 38 - 126 U/L - - -  AST 15 - 41 U/L - - -  ALT 0 - 44 U/L - - -    Lab Results  Component Value Date   WBC 5.2 12/03/2020   HGB 9.2 (L) 12/03/2020   HCT 26.3 (L) 12/03/2020   MCV 75.4 (L) 12/03/2020   PLT 201 12/03/2020   NEUTROABS 1.9 12/02/2020    DG Chest Port 1 View  Result Date: 11/30/2020 CLINICAL DATA:  Questionable sepsis. Patient began treatment for colon cancer 3 months ago. Loss of appetite, severe abdominal pain, weakness, and back pain. EXAM: PORTABLE CHEST 1 VIEW COMPARISON:  11/27/2020 FINDINGS: Heart size and pulmonary vascularity are normal. Lungs clear. Power port type central venous catheter with tip over the low SVC region. No pneumothorax. No pleural effusions. Mediastinal contours appear intact. IMPRESSION: No active disease. Electronically Signed   By: Lucienne Capers M.D.   On: 11/30/2020 22:36   DG Chest Port 1 View  Result Date: 11/27/2020 CLINICAL DATA:  Weakness.  Fall today. EXAM: PORTABLE CHEST 1 VIEW COMPARISON:  Radiograph 11/15/2020 FINDINGS: Right chest port unchanged in position. Normal heart size and mediastinal contours. No acute or focal airspace disease no pleural fluid, pulmonary edema, or pneumothorax. No acute osseous abnormalities are seen. IMPRESSION: No acute chest findings. Electronically Signed   By: Keith Rake M.D.   On: 11/27/2020 22:09   DG Chest Portable 1 View  Result Date: 11/15/2020 CLINICAL DATA:  Fatigue, colon cancer EXAM: PORTABLE CHEST 1 VIEW COMPARISON:  10/01/2020 FINDINGS:  Right Port-A-Cath remains in place, unchanged. Heart and mediastinal contours are within normal limits. No focal opacities or effusions. No acute bony abnormality. IMPRESSION: No active disease. Electronically Signed   By: Rolm Baptise M.D.   On: 11/15/2020 14:34   ECHOCARDIOGRAM COMPLETE  Result Date: 12/02/2020    ECHOCARDIOGRAM REPORT   Patient Name:   Thomas [name] Date of Exam: 12/02/2020 Medical Rec #:  [id number]    Height:       68.0 in Accession #:    [id number]   Weight:       246.5 lb Date of Birth:  June 11, 1955    BSA:          2.234 m Patient Age:    65 years     BP:           136/77 mmHg Patient Gender: M            HR:           92 bpm. Exam Location:  Inpatient Procedure: 2D Echo, Color Doppler and Cardiac Doppler Indications:    I48.91* Unspecified atrial fibrillation  History:        Patient has no prior history of Echocardiogram examinations.                 Arrythmias:Atrial Fibrillation; Risk Factors:Hypertension,                 Diabetes, Dyslipidemia and Sleep Apnea.  Sonographer:    Raquel Sarna Senior RDCS Referring Phys: [phone] [facility]  Sonographer Comments: Technically difficult due to patient body habitus. IMPRESSIONS  1. Mild intracavitary gradient. Peak velocity 1.37 m/s. Peak gradient 7.5 mmHg. Left ventricular ejection fraction, by estimation, is 60 to 65%. The left ventricle has normal function. The left ventricle has no regional wall motion abnormalities. There is moderate concentric left ventricular hypertrophy. Left ventricular diastolic parameters are consistent with Grade I diastolic dysfunction (impaired relaxation).  2. Right ventricular systolic function is normal. The right ventricular size is normal. There is normal pulmonary artery systolic pressure.  3. The mitral valve is normal in structure. No evidence of mitral valve regurgitation. No evidence of mitral stenosis.  4. The aortic valve is calcified. There is moderate calcification of the aortic valve. Aortic valve  regurgitation is not visualized. No aortic stenosis is present.  5. The inferior vena cava is normal in size with greater than 50% respiratory variability, suggesting right atrial pressure of 3 mmHg. FINDINGS  Left Ventricle: Mild intracavitary gradient. Peak velocity 1.37 m/s. Peak gradient 7.5 mmHg. Left ventricular ejection fraction, by estimation, is 60 to 65%. The left ventricle has normal function. The left ventricle has no regional wall motion abnormalities. The left ventricular internal cavity size was normal in size. There is moderate concentric left ventricular hypertrophy. Left ventricular diastolic parameters are consistent with Grade I diastolic dysfunction (impaired relaxation). Indeterminate filling pressures. Right Ventricle: The right ventricular size is normal. No increase in right ventricular  wall thickness. Right ventricular systolic function is normal. There is normal pulmonary artery systolic pressure. The tricuspid regurgitant velocity is 1.72 m/s, and  with an assumed right atrial pressure of 8 mmHg, the estimated right ventricular systolic pressure is 28.3 mmHg. Left Atrium: Left atrial size was normal in size. Right Atrium: Right atrial size was normal in size. Pericardium: There is no evidence of pericardial effusion. Mitral Valve: The mitral valve is normal in structure. No evidence of mitral valve regurgitation. No evidence of mitral valve stenosis. Tricuspid Valve: The tricuspid valve is normal in structure. Tricuspid valve regurgitation is trivial. No evidence of tricuspid stenosis. Aortic Valve: The aortic valve is calcified. There is moderate calcification of the aortic valve. Aortic valve regurgitation is not visualized. No aortic stenosis is present. Pulmonic Valve: The pulmonic valve was normal in structure. Pulmonic valve regurgitation is not visualized. No evidence of pulmonic stenosis. Aorta: The aortic root is normal in size and structure. Venous: The inferior vena cava is  normal in size with greater than 50% respiratory variability, suggesting right atrial pressure of 3 mmHg. IAS/Shunts: No atrial level shunt detected by color flow Doppler.  LEFT VENTRICLE PLAX 2D LVIDd:         3.20 cm  Diastology LVIDs:         2.10 cm  LV e' medial:    4.68 cm/s LV PW:         1.30 cm  LV E/e' medial:  11.5 LV IVS:        1.50 cm  LV e' lateral:   6.85 cm/s LVOT diam:     2.00 cm  LV E/e' lateral: 7.9 LV SV:         43 LV SV Index:   19 LVOT Area:     3.14 cm  RIGHT VENTRICLE RV S prime:     13.50 cm/s TAPSE (M-mode): 1.6 cm LEFT ATRIUM             Index       RIGHT ATRIUM           Index LA diam:        3.20 cm 1.43 cm/m  RA Area:     12.50 cm LA Vol (A2C):   46.5 ml 20.82 ml/m RA Volume:   26.60 ml  11.91 ml/m LA Vol (A4C):   33.6 ml 15.04 ml/m LA Biplane Vol: 41.5 ml 18.58 ml/m  AORTIC VALVE LVOT Vmax:   98.65 cm/s LVOT Vmean:  79.600 cm/s LVOT VTI:    0.138 m  AORTA Ao Root diam: 3.10 cm MITRAL VALVE                TRICUSPID VALVE MV Area (PHT): 3.10 cm     TR Peak grad:   11.8 mmHg MV Decel Time: 245 msec     TR Vmax:        172.00 cm/s MV E velocity: 54.00 cm/s MV A velocity: 102.00 cm/s  SHUNTS MV E/A ratio:  0.53         Systemic VTI:  0.14 m                             Systemic Diam: 2.00 cm Skeet Latch MD Electronically signed by Skeet Latch MD Signature Date/Time: 12/02/2020/5:47:16 PM    Final    CT CHEST ABDOMEN PELVIS WO CONTRAST  Result Date: 12/01/2020 CLINICAL DATA:  Abdominal pain and fever EXAM: CT  CHEST, ABDOMEN AND PELVIS WITHOUT CONTRAST TECHNIQUE: Multidetector CT imaging of the chest, abdomen and pelvis was performed following the standard protocol without IV contrast. COMPARISON:  09/04/2020 FINDINGS: CT CHEST FINDINGS Cardiovascular: Calcific atherosclerosis of the aorta and coronary arteries. No pericardial effusion. Course and caliber of the aorta are normal. Right chest wall Port-A-Cath tip is at the cavoatrial junction. Mediastinum/Nodes: No  enlarged mediastinal, hilar, or axillary lymph nodes. Thyroid gland, trachea, and esophagus demonstrate no significant findings. Lungs/Pleura: Lungs are clear. No pleural effusion or pneumothorax. Musculoskeletal: No chest wall mass or suspicious bone lesions identified. CT ABDOMEN PELVIS FINDINGS Hepatobiliary: No focal liver abnormality is seen. No gallstones, gallbladder wall thickening, or biliary dilatation. Pancreas: Unremarkable. No pancreatic ductal dilatation or surrounding inflammatory changes. Spleen: Normal in size without focal abnormality. Adrenals/Urinary Tract: Adrenal glands are unremarkable. Kidneys are normal, without renal calculi, focal lesion, or hydronephrosis. Bladder is unremarkable. 3.5 cm left renal cyst. Stomach/Bowel: Normal appearance of the stomach and duodenum. No small bowel dilatation. 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 Status post partial colectomy. Vascular/Lymphatic: Aortic atherosclerosis. No enlarged abdominal or pelvic lymph nodes. Reproductive: Prostate is unremarkable. Other: Bilateral fat containing inguinal hernias. Musculoskeletal: No acute or significant osseous findings. IMPRESSION: 1. No acute abnormality of the chest, abdomen or pelvis. 2. Status post partial colectomy.  No evidence of obstruction. Aortic Atherosclerosis (ICD10-I70.0). Electronically Signed   By: Ulyses Jarred M.D.   On: 12/01/2020 02:20    ASSESSMENT AND PLAN: Transverse colon cancer, stage IIIb (T3N1a), status post a partial transverse colectomy and end colostomy 09/10/2020 CT abdomen/pelvis 09/04/2020- possible transverse colon mass with small regional lymph nodes Colonoscopy 09/05/2020- obstructing transverse colon mass-biopsy invasive adenocarcinoma, mass could not be passed CT chest 09/09/2020-no evidence of metastatic disease, 3 x 2 mm subpleural nodule in the right upper lobe likely benign subpleural lymph node Partial transverse colectomy 09/10/2020, transverse colon tumor, no  lymphovascular perineural invasion, 1/19 lymph nodes positive, negative margins, MSI stable, no loss of mismatch repair protein expression Cycle 1 CAPOX 10/08/2020, oxaliplatin dose reduced to 100 mg per metered squared secondary to renal insufficiency Cycle 2 CAPOX 10/29/2020 Cycle 3 CAPOX 11/19/2020, capecitabine dose reduced secondary to hand/foot syndrome, oxaliplatin dose reduced secondary to renal failure and poor performance status CT chest/abdomen/pelvis without contrast 12/01/2020-no acute abnormality in the chest, abdomen, or pelvis.  No evidence of obstruction. Resection of a frontal meningioma 09/01/2016 Diabetes Rectal polyp- tubular adenoma on colonoscopy 09/05/2020 Hypertension Diabetic neuropathy Epilepsy Family history of breast and prostate cancer Admission with acute renal failure 10/01/2020-secondary to lack of oral intake and lisinopril, improved with intravenous hydration and holding lisinopril Admission 12/01/2020-sepsis, diarrhea HHS, A. fib with RVR, abdominal pain  Mr. Shatswell was admitted with hyperglycemia, dehydration, and diarrhea.  I suspect his clinical presentation is related to chemotherapy induced enteritis in the setting of a high output ostomy and diabetes.  The stool is negative for C. difficile.  He is now at day 15 following cycle 3 CAPOX.  He has mucositis and hand/foot syndrome secondary to capecitabine. He is scheduled for a final cycle of adjuvant chemotherapy next week.  We will most likely delay this cycle of chemotherapy and consider further dose reduction or discontinuation of capecitabine.  Recommendations: 1.  Continue Magic mouthwash 2.  Moisturizers, skin care 3.  Continue intravenous hydration 4.  Advance diet, out of bed 5.  Continue Imodium and Lomotil, can add tincture of opium if the diarrhea does not improve today.    LOS: 2  days   Betsy Coder, MD 12/03/20

## 2020-12-03 NOTE — Progress Notes (Signed)
Pharmacy Antibiotic Note  Thomas [name] is a 65 y.o. male admitted on 11/30/2020 with sepsis.  Pharmacy has been consulted for Cefepime dosing. AKI- improving  12/03/20 7:47 AM  SCr 1.44 today at baseline Remains afebrile  Plan: Increase cefepime to 2 g iv q 8 hours with improvement in renal function Monitor renal function and cx data  F/U planned duration of therapy  Height: 5\' 8"  (172.7 cm) Weight: 111.8 kg (246 lb 7.6 oz) IBW/kg (Calculated) : 68.4  Temp (24hrs), Avg:98 F (36.7 C), Min:97.1 F (36.2 C), Max:99.3 F (37.4 C)  Recent Labs  Lab 11/27/20 2040 11/30/20 2205 12/01/20 0018 12/01/20 0228 12/01/20 0432 12/01/20 1226 12/02/20 0308 12/02/20 0623 12/03/20 0251  WBC 5.4 4.8  --   --  4.1  --  4.4  --  5.2  CREATININE 1.60* 2.93*  --   --  2.12* 1.73* 1.96*  --  1.44*  LATICACIDVEN  --  6.6* 6.8* 5.4* 4.3*  --   --  1.8  --      Estimated Creatinine Clearance: 62.9 mL/min (A) (by C-G formula based on SCr of 1.44 mg/dL (H)).    No Known Allergies  Antimicrobials this admission: 7/24 Cefepime >>  7/25 Vancomycin x1 in ED  Dose adjustments this admission:  Microbiology results: 7/24 BCx: ngtd 7/25 UCx:  ngf 7/25 MRSA PCR: neg 7/26 C diff: neg  Thank you for allowing pharmacy to be a part of this patient's care.  Ulice Dash D PharmD 12/03/2020 7:47 AM

## 2020-12-03 NOTE — Progress Notes (Addendum)
eLink Physician-Brief Progress Note Patient Name: Thomas [name] DOB: 1955-09-29 MRN: [id number]   Date of Service  12/03/2020  HPI/Events of Note  AFIB with RVR - Ventricular rate 108 to 130. BMP and Mg++ level already sent. BP = 121/74.  eICU Interventions  Plan: Restart Cardizem IV infusion. Titrate to HR = 65-105. Await BMP and Mg++.     Intervention Category Major Interventions: Arrhythmia - evaluation and management  Tryton Bodi Cornelia Copa 12/03/2020, 3:53 AM

## 2020-12-04 ENCOUNTER — Inpatient Hospital Stay (HOSPITAL_COMMUNITY): Payer: Medicare HMO

## 2020-12-04 ENCOUNTER — Other Ambulatory Visit (HOSPITAL_COMMUNITY): Payer: Self-pay

## 2020-12-04 DIAGNOSIS — G9341 Metabolic encephalopathy: Secondary | ICD-10-CM

## 2020-12-04 DIAGNOSIS — E440 Moderate protein-calorie malnutrition: Secondary | ICD-10-CM | POA: Diagnosis not present

## 2020-12-04 DIAGNOSIS — T451X5A Adverse effect of antineoplastic and immunosuppressive drugs, initial encounter: Secondary | ICD-10-CM

## 2020-12-04 DIAGNOSIS — I4891 Unspecified atrial fibrillation: Secondary | ICD-10-CM | POA: Diagnosis not present

## 2020-12-04 DIAGNOSIS — E1100 Type 2 diabetes mellitus with hyperosmolarity without nonketotic hyperglycemic-hyperosmolar coma (NKHHC): Secondary | ICD-10-CM | POA: Diagnosis not present

## 2020-12-04 DIAGNOSIS — E872 Acidosis: Secondary | ICD-10-CM | POA: Diagnosis not present

## 2020-12-04 LAB — BASIC METABOLIC PANEL
Anion gap: 6 (ref 5–15)
BUN: 43 mg/dL — ABNORMAL HIGH (ref 8–23)
CO2: 21 mmol/L — ABNORMAL LOW (ref 22–32)
Calcium: 8 mg/dL — ABNORMAL LOW (ref 8.9–10.3)
Chloride: 112 mmol/L — ABNORMAL HIGH (ref 98–111)
Creatinine, Ser: 1.49 mg/dL — ABNORMAL HIGH (ref 0.61–1.24)
GFR, Estimated: 52 mL/min — ABNORMAL LOW (ref >60)
Glucose, Bld: 218 mg/dL — ABNORMAL HIGH (ref 70–99)
Potassium: 4.8 mmol/L (ref 3.5–5.1)
Sodium: 139 mmol/L (ref 135–145)

## 2020-12-04 LAB — CBC WITH DIFFERENTIAL/PLATELET
Abs Immature Granulocytes: 1 10*3/uL — ABNORMAL HIGH (ref 0.00–0.07)
Band Neutrophils: 10 %
Basophils Absolute: 0 10*3/uL (ref 0.0–0.1)
Basophils Relative: 0 %
Eosinophils Absolute: 0.3 10*3/uL (ref 0.0–0.5)
Eosinophils Relative: 8 %
HCT: 27.8 % — ABNORMAL LOW (ref 39.0–52.0)
Hemoglobin: 9.7 g/dL — ABNORMAL LOW (ref 13.0–17.0)
Lymphocytes Relative: 22 %
Lymphs Abs: 0.9 10*3/uL (ref 0.7–4.0)
MCH: 26.7 pg (ref 26.0–34.0)
MCHC: 34.9 g/dL (ref 30.0–36.0)
MCV: 76.6 fL — ABNORMAL LOW (ref 80.0–100.0)
Metamyelocytes Relative: 7 %
Monocytes Absolute: 0.5 10*3/uL (ref 0.1–1.0)
Monocytes Relative: 11 %
Myelocytes: 15 %
Neutro Abs: 1.5 10*3/uL — ABNORMAL LOW (ref 1.7–7.7)
Neutrophils Relative %: 25 %
Platelets: 253 10*3/uL (ref 150–400)
Promyelocytes Relative: 2 %
RBC: 3.63 MIL/uL — ABNORMAL LOW (ref 4.22–5.81)
RDW: 21.2 % — ABNORMAL HIGH (ref 11.5–15.5)
WBC: 4.2 10*3/uL (ref 4.0–10.5)
nRBC: 19 % — ABNORMAL HIGH (ref 0.0–0.2)

## 2020-12-04 LAB — GLUCOSE, CAPILLARY
Glucose-Capillary: 174 mg/dL — ABNORMAL HIGH (ref 70–99)
Glucose-Capillary: 196 mg/dL — ABNORMAL HIGH (ref 70–99)
Glucose-Capillary: 197 mg/dL — ABNORMAL HIGH (ref 70–99)
Glucose-Capillary: 200 mg/dL — ABNORMAL HIGH (ref 70–99)
Glucose-Capillary: 201 mg/dL — ABNORMAL HIGH (ref 70–99)
Glucose-Capillary: 225 mg/dL — ABNORMAL HIGH (ref 70–99)

## 2020-12-04 MED ORDER — HYDROCODONE-ACETAMINOPHEN 7.5-325 MG/15ML PO SOLN
10.0000 mL | Freq: Four times a day (QID) | ORAL | Status: DC | PRN
Start: 2020-12-04 — End: 2020-12-05

## 2020-12-04 MED ORDER — APIXABAN 5 MG PO TABS
5.0000 mg | ORAL_TABLET | Freq: Two times a day (BID) | ORAL | Status: DC
  Administered 2020-12-04 – 2020-12-05 (×4): 5 mg via ORAL
  Filled 2020-12-04 (×4): qty 1

## 2020-12-04 MED ORDER — FENTANYL CITRATE (PF) 100 MCG/2ML IJ SOLN
25.0000 ug | INTRAMUSCULAR | Status: DC | PRN
  Administered 2020-12-04 – 2020-12-05 (×3): 25 ug via INTRAVENOUS
  Filled 2020-12-04 (×3): qty 2

## 2020-12-04 MED ORDER — SODIUM CHLORIDE 0.9% FLUSH
10.0000 mL | INTRAVENOUS | Status: DC | PRN
  Administered 2020-12-24 – 2021-03-13 (×10): 10 mL

## 2020-12-04 MED ORDER — METOPROLOL TARTRATE 5 MG/5ML IV SOLN
5.0000 mg | INTRAVENOUS | Status: DC | PRN
  Administered 2020-12-04 – 2020-12-05 (×2): 5 mg via INTRAVENOUS
  Filled 2020-12-04 (×3): qty 5

## 2020-12-04 NOTE — Discharge Instructions (Addendum)
Information on my medicine - ELIQUIS (apixaban)  This medication education was reviewed with me or my healthcare representative as part of my discharge preparation.  Why was Eliquis prescribed for you? Eliquis was prescribed for you to reduce the risk of a blood clot forming that can cause a stroke if you have a medical condition called atrial fibrillation (a type of irregular heartbeat) and for treatment of right lower extremity deep vein thrombosis (blood clot) and prevention of other DVTs (blood clots).  What do You need to know about Eliquis ? Take your Eliquis TWICE DAILY - one tablet in the morning and one tablet in the evening with or without food. If you have difficulty swallowing the tablet whole please discuss with your pharmacist how to take the medication safely.  Take Eliquis exactly as prescribed by your doctor and DO NOT stop taking Eliquis without talking to the doctor who prescribed the medication.  Stopping may increase your risk of developing a stroke.  Refill your prescription before you run out.  After discharge, you should have regular check-up appointments with your healthcare provider that is prescribing your Eliquis.  In the future your dose may need to be changed if your kidney function or weight changes by a significant amount or as you get older.  What do you do if you miss a dose? If you miss a dose, take it as soon as you remember on the same day and resume taking twice daily.  Do not take more than one dose of ELIQUIS at the same time to make up a missed dose.  Important Safety Information A possible side effect of Eliquis is bleeding. You should call your healthcare provider right away if you experience any of the following: Bleeding from an injury or your nose that does not stop. Unusual colored urine (red or dark brown) or unusual colored stools (red or black). Unusual bruising for unknown reasons. A serious fall or if you hit your head (even if there  is no bleeding).  Some medicines may interact with Eliquis and might increase your risk of bleeding or clotting while on Eliquis. To help avoid this, consult your healthcare provider or pharmacist prior to using any new prescription or non-prescription medications, including herbals, vitamins, non-steroidal anti-inflammatory drugs (NSAIDs) and supplements.  This website has more information on Eliquis (apixaban): http://www.eliquis.com/eliquis/home  Nutrition [place] [facility] Stay Proper nutrition can help your body recover from illness and injury.   Foods and beverages high in protein, vitamins, and minerals help rebuild muscle loss, promote healing, & reduce fall risk.   In addition to eating healthy foods, a nutrition shake is an easy, delicious way to get the nutrition you need during and after your hospital stay  It is recommended that you continue to drink 2 bottles per day of:       Ensure Complete for at least 1 month (30 days) after your hospital stay   Tips for adding a nutrition shake into your routine: As allowed, drink one with vitamins or medications instead of water or juice Enjoy one as a tasty mid-morning or afternoon snack Drink cold or make a milkshake out of it Drink one instead of milk with cereal or snacks Use as a coffee creamer   Available at the following grocery stores and pharmacies:           * [place] *  [facility] [place] visit: www.ensure.com/join or http://dawson-may.com/   Suggested Substitutions Ensure Plus = Boost Plus = Carnation Breakfast Essentials = Boost Compact Ensure Active Clear = Boost Breeze Glucerna Shake = Boost Glucose Control = Carnation Breakfast Essentials SUGAR FREE    Suggestions For Increasing Calories And Protein Several small meals a day are  easier to eat and digest than three large ones. Space meals about 2 to 3 hours apart to maximize comfort. Stop eating 2 to 3 hours before bed and sleep with your head elevated if gastric reflux (GERD) and heartburn are problems. Do not eat your favorite foods if you are feeling bad. Save them for when you feel good! Eat breakfast-type foods at any meal. Eggs are usually easy to eat and are great any time of the day. (The same goes for pancakes and waffles.) Eat when you feel hungry. Most people have the greatest appetite in the morning because they have not eaten all night. If this is the best meal for you, then pile on those calories and other nutrients in the morning and at lunch. Then you can have a smaller dinner without losing total calories for the day. Eat leftovers or nutritious snacks in the afternoon and early evening to round out your day. Try homemade or commercially prepared nutrition bars and puddings, as well as calorie- and protein-rich liquid nutritional supplements. Benefits of Physical Activity Talk to your doctor about physical activity. Light or moderate physical activity can help maintain muscle and promote an appetite. Walking in the neighborhood or the local mall is a great way to get up, get out, and get moving. If you are unsteady on your feet, try walking around the dining room table. Save Room for [employer]! Drink most fluids between meals instead of with meals. (It is fine to have a sip to help swallow food at meal time.) Fluids (which usually have fewer calories and nutrients than solid food) can take up valuable space in your stomach.  Foods Recommended High-Protein Foods Milk products Add cheese to toast, crackers, sandwiches, baked potatoes, vegetables, soups, noodles, meat, and fruit. Use reduced-fat (2%) or whole milk in place of water when cooking cereal and cream soups. Include cream sauces on vegetables and pasta. Add powdered milk to cream soups and mashed  potatoes.  Eggs Have hard-cooked eggs readily available in the refrigerator. Chop and add to salads, casseroles, soups, and vegetables. Make a quick egg salad. All eggs should be well cooked to avoid the risk of harmful bacteria.  Meats, poultry, and fish Add leftover cooked meats to soups, casseroles, salads, and omelets. Make dip by mixing diced, chopped, or shredded meat with sour cream and spices.  Beans, legumes, nuts, and seeds Sprinkle nuts and seeds on cereals, fruit, and desserts such as ice cream, pudding, and custard. Also serve nuts and seeds on vegetables, salads, and pasta. Spread peanut butter on toast, bread, English muffins, and fruit, or blend it in a milk shake. Add beans and peas to salads, soups, casseroles, and vegetable dishes.  High-Calorie Foods Butter, margarine, and  oils Melt butter or margarine over potatoes, rice, pasta, and cooked vegetables. Add melted butter or margarine into soups and casseroles and spread on bread for sandwiches before spreading sandwich spread or  peanut butter. Saut or stir-fry vegetables, meats, chicken and fish such as shrimp/scallops in olive or canola oil. A variety of oils add calories and can be used to [employer], chicken, or fish.  Milk products Add whipping cream to desserts, pancakes, waffles, fruit, and hot chocolate, and fold it into soups and casseroles. Add sour cream to baked potatoes and vegetables.  Salad dressing Use regular (not low-fat or diet) mayonnaise and salad dressing on sandwiches and in dips with vegetables and fruit.   Sweets Add jelly and honey to bread and crackers. Add jam to fruit and ice cream and as a topping over cake.   Copyright 2020  Academy of [employer]etics. All rights reserved.

## 2020-12-04 NOTE — Progress Notes (Signed)
After giving pt PO cardizem & PO hydrocodone, pt began to cough, became tachypnic & desatted to the upper 80s. O2 increased to 5L. pt satting 94 now and RR 27. Pt lung sounds rhonchus. Olena Heckle NP notified and STAT chest Xray ordered. This RN will continue to monitor and hold all further PO.

## 2020-12-04 NOTE — Progress Notes (Signed)
HEMATOLOGY-ONCOLOGY PROGRESS NOTE  SUBJECTIVE: Mr. Bromell was alert and appeared confused when I saw him this morning.  He requests water. Oncology History  Colon cancer ([place])  09/23/2020 Initial Diagnosis   Colon cancer ([place])    09/23/2020 Cancer Staging   Staging form: Colon and Rectum, AJCC 8th Edition - Pathologic: Stage IIIB (pT3, pN1a, cM0) - Signed by Ladell Pier, MD on 09/23/2020  Total positive nodes: 1  Histologic grading system: 4 grade system  Histologic grade (G): G1  Residual tumor (R): R0 - None  Lymph-vascular invasion (LVI): LVI not present (absent)/not identified  Tumor deposits (TD): Absent  Perineural invasion (PNI): Absent  Microsatellite instability (MSI): Stable    10/08/2020 -  Chemotherapy    Patient is on Treatment Plan: COLORECTAL XELOX (CAPEOX) Q21D         PHYSICAL EXAMINATION:  Vitals:   12/04/20 1300 12/04/20 1546  BP: 120/73   Pulse:    Resp: (!) 23   Temp:  98.4 F (36.9 C)  SpO2:     Filed Weights   12/01/20 0825  Weight: 246 lb 7.6 oz (111.8 kg)    Intake/Output from previous day: 07/27 0701 - 07/28 0700 In: 3018.1 [I.V.:2397.3; IV Piggyback:620.8] Out: 2350 [Urine:875; Stool:1475]  GENERAL:alert, no distress and comfortable SKIN: Dryness of the palms and soles, erythema with superficial desquamation and linear ulcers at the hands, superficial skin breakdown at the scrotum, hyperpigmentation of the hands EYES: normal, Conjunctiva are pink and non-injected, sclera clear OROPHARYNX: He has several mouth sores over the palate, tongue, and lower lip that appear to be healing LUNGS: clear to auscultation and percussion with normal breathing effort HEART: Tachycardic, regular ABDOMEN: Right abdomen colostomy with small amount of liquid stool NEURO: alert, not oriented  Port-A-Cath without erythema  LABORATORY DATA:  I have reviewed the data as listed CMP Latest Ref Rng & Units 12/04/2020 12/03/2020 12/02/2020  Glucose  70 - 99 mg/dL 218(H) 217(H) 250(H)  BUN 8 - 23 mg/dL 43(H) 48(H) 56(H)  Creatinine 0.61 - 1.24 mg/dL 1.49(H) 1.44(H) 1.96(H)  Sodium 135 - 145 mmol/L 139 139 137  Potassium 3.5 - 5.1 mmol/L 4.8 4.4 4.7  Chloride 98 - 111 mmol/L 112(H) 112(H) 111  CO2 22 - 32 mmol/L 21(L) 20(L) 19(L)  Calcium 8.9 - 10.3 mg/dL 8.0(L) 8.3(L) 8.2(L)  Total Protein 6.5 - 8.1 g/dL - - -  Total Bilirubin 0.3 - 1.2 mg/dL - - -  Alkaline Phos 38 - 126 U/L - - -  AST 15 - 41 U/L - - -  ALT 0 - 44 U/L - - -    Lab Results  Component Value Date   WBC 4.2 12/04/2020   HGB 9.7 (L) 12/04/2020   HCT 27.8 (L) 12/04/2020   MCV 76.6 (L) 12/04/2020   PLT 253 12/04/2020   NEUTROABS 1.5 (L) 12/04/2020    DG CHEST PORT 1 VIEW  Result Date: 12/04/2020 CLINICAL DATA:  Vomiting with possible aspiration. EXAM: PORTABLE CHEST 1 VIEW COMPARISON:  November 30, 2020 FINDINGS: There is stable right-sided venous Port-A-Cath positioning. Mild linear atelectasis is seen within the mid left lung. This represents a new finding when compared to the prior exam. There is no evidence of a pleural effusion or pneumothorax. The heart size and mediastinal contours are within normal limits. The visualized skeletal structures are unremarkable. IMPRESSION: Interval development of mild linear atelectasis within the mid left lung since the prior study. Electronically Signed   By: Joyce Gross.D.  On: 12/04/2020 01:19   DG Chest Port 1 View  Result Date: 11/30/2020 CLINICAL DATA:  Questionable sepsis. Patient began treatment for colon cancer 3 months ago. Loss of appetite, severe abdominal pain, weakness, and back pain. EXAM: PORTABLE CHEST 1 VIEW COMPARISON:  11/27/2020 FINDINGS: Heart size and pulmonary vascularity are normal. Lungs clear. Power port type central venous catheter with tip over the low SVC region. No pneumothorax. No pleural effusions. Mediastinal contours appear intact. IMPRESSION: No active disease. Electronically Signed   By:  Lucienne Capers M.D.   On: 11/30/2020 22:36   DG Chest Port 1 View  Result Date: 11/27/2020 CLINICAL DATA:  Weakness.  Fall today. EXAM: PORTABLE CHEST 1 VIEW COMPARISON:  Radiograph 11/15/2020 FINDINGS: Right chest port unchanged in position. Normal heart size and mediastinal contours. No acute or focal airspace disease no pleural fluid, pulmonary edema, or pneumothorax. No acute osseous abnormalities are seen. IMPRESSION: No acute chest findings. Electronically Signed   By: Keith Rake M.D.   On: 11/27/2020 22:09   DG Chest Portable 1 View  Result Date: 11/15/2020 CLINICAL DATA:  Fatigue, colon cancer EXAM: PORTABLE CHEST 1 VIEW COMPARISON:  10/01/2020 FINDINGS: Right Port-A-Cath remains in place, unchanged. Heart and mediastinal contours are within normal limits. No focal opacities or effusions. No acute bony abnormality. IMPRESSION: No active disease. Electronically Signed   By: Rolm Baptise M.D.   On: 11/15/2020 14:34   ECHOCARDIOGRAM COMPLETE  Result Date: 12/02/2020    ECHOCARDIOGRAM REPORT   Patient Name:   HUXLEY SHURLEY Date of Exam: 12/02/2020 Medical Rec #:  [id number]    Height:       68.0 in Accession #:    [id number]   Weight:       246.5 lb Date of Birth:  [birth year]    BSA:          2.234 m Patient Age:    65 years     BP:           136/77 mmHg Patient Gender: M            HR:           92 bpm. Exam Location:  Inpatient Procedure: 2D Echo, Color Doppler and Cardiac Doppler Indications:    I48.91* Unspecified atrial fibrillation  History:        Patient has no prior history of Echocardiogram examinations.                 Arrythmias:Atrial Fibrillation; Risk Factors:Hypertension,                 Diabetes, Dyslipidemia and Sleep Apnea.  Sonographer:    Raquel Sarna Senior RDCS Referring Phys: [phone] [facility]  Sonographer Comments: Technically difficult due to patient body habitus. IMPRESSIONS  1. Mild intracavitary gradient. Peak velocity 1.37 m/s. Peak gradient 7.5 mmHg. Left ventricular  ejection fraction, by estimation, is 60 to 65%. The left ventricle has normal function. The left ventricle has no regional wall motion abnormalities. There is moderate concentric left ventricular hypertrophy. Left ventricular diastolic parameters are consistent with Grade I diastolic dysfunction (impaired relaxation).  2. Right ventricular systolic function is normal. The right ventricular size is normal. There is normal pulmonary artery systolic pressure.  3. The mitral valve is normal in structure. No evidence of mitral valve regurgitation. No evidence of mitral stenosis.  4. The aortic valve is calcified. There is moderate calcification of the aortic valve. Aortic valve regurgitation is not visualized. No aortic stenosis is  present.  5. The inferior vena cava is normal in size with greater than 50% respiratory variability, suggesting right atrial pressure of 3 mmHg. FINDINGS  Left Ventricle: Mild intracavitary gradient. Peak velocity 1.37 m/s. Peak gradient 7.5 mmHg. Left ventricular ejection fraction, by estimation, is 60 to 65%. The left ventricle has normal function. The left ventricle has no regional wall motion abnormalities. The left ventricular internal cavity size was normal in size. There is moderate concentric left ventricular hypertrophy. Left ventricular diastolic parameters are consistent with Grade I diastolic dysfunction (impaired relaxation). Indeterminate filling pressures. Right Ventricle: The right ventricular size is normal. No increase in right ventricular wall thickness. Right ventricular systolic function is normal. There is normal pulmonary artery systolic pressure. The tricuspid regurgitant velocity is 1.72 m/s, and  with an assumed right atrial pressure of 8 mmHg, the estimated right ventricular systolic pressure is 16.1 mmHg. Left Atrium: Left atrial size was normal in size. Right Atrium: Right atrial size was normal in size. Pericardium: There is no evidence of pericardial effusion.  Mitral Valve: The mitral valve is normal in structure. No evidence of mitral valve regurgitation. No evidence of mitral valve stenosis. Tricuspid Valve: The tricuspid valve is normal in structure. Tricuspid valve regurgitation is trivial. No evidence of tricuspid stenosis. Aortic Valve: The aortic valve is calcified. There is moderate calcification of the aortic valve. Aortic valve regurgitation is not visualized. No aortic stenosis is present. Pulmonic Valve: The pulmonic valve was normal in structure. Pulmonic valve regurgitation is not visualized. No evidence of pulmonic stenosis. Aorta: The aortic root is normal in size and structure. Venous: The inferior vena cava is normal in size with greater than 50% respiratory variability, suggesting right atrial pressure of 3 mmHg. IAS/Shunts: No atrial level shunt detected by color flow Doppler.  LEFT VENTRICLE PLAX 2D LVIDd:         3.20 cm  Diastology LVIDs:         2.10 cm  LV e' medial:    4.68 cm/s LV PW:         1.30 cm  LV E/e' medial:  11.5 LV IVS:        1.50 cm  LV e' lateral:   6.85 cm/s LVOT diam:     2.00 cm  LV E/e' lateral: 7.9 LV SV:         43 LV SV Index:   19 LVOT Area:     3.14 cm  RIGHT VENTRICLE RV S prime:     13.50 cm/s TAPSE (M-mode): 1.6 cm LEFT ATRIUM             Index       RIGHT ATRIUM           Index LA diam:        3.20 cm 1.43 cm/m  RA Area:     12.50 cm LA Vol (A2C):   46.5 ml 20.82 ml/m RA Volume:   26.60 ml  11.91 ml/m LA Vol (A4C):   33.6 ml 15.04 ml/m LA Biplane Vol: 41.5 ml 18.58 ml/m  AORTIC VALVE LVOT Vmax:   98.65 cm/s LVOT Vmean:  79.600 cm/s LVOT VTI:    0.138 m  AORTA Ao Root diam: 3.10 cm MITRAL VALVE                TRICUSPID VALVE MV Area (PHT): 3.10 cm     TR Peak grad:   11.8 mmHg MV Decel Time: 245 msec     TR Vmax:  172.00 cm/s MV E velocity: 54.00 cm/s MV A velocity: 102.00 cm/s  SHUNTS MV E/A ratio:  0.53         Systemic VTI:  0.14 m                             Systemic Diam: 2.00 cm Skeet Latch MD  Electronically signed by Skeet Latch MD Signature Date/Time: 12/02/2020/5:47:16 PM    Final    CT CHEST ABDOMEN PELVIS WO CONTRAST  Result Date: 12/01/2020 CLINICAL DATA:  Abdominal pain and fever EXAM: CT CHEST, ABDOMEN AND PELVIS WITHOUT CONTRAST TECHNIQUE: Multidetector CT imaging of the chest, abdomen and pelvis was performed following the standard protocol without IV contrast. COMPARISON:  09/04/2020 FINDINGS: CT CHEST FINDINGS Cardiovascular: Calcific atherosclerosis of the aorta and coronary arteries. No pericardial effusion. Course and caliber of the aorta are normal. Right chest wall Port-A-Cath tip is at the cavoatrial junction. Mediastinum/Nodes: No enlarged mediastinal, hilar, or axillary lymph nodes. Thyroid gland, trachea, and esophagus demonstrate no significant findings. Lungs/Pleura: Lungs are clear. No pleural effusion or pneumothorax. Musculoskeletal: No chest wall mass or suspicious bone lesions identified. CT ABDOMEN PELVIS FINDINGS Hepatobiliary: No focal liver abnormality is seen. No gallstones, gallbladder wall thickening, or biliary dilatation. Pancreas: Unremarkable. No pancreatic ductal dilatation or surrounding inflammatory changes. Spleen: Normal in size without focal abnormality. Adrenals/Urinary Tract: Adrenal glands are unremarkable. Kidneys are normal, without renal calculi, focal lesion, or hydronephrosis. Bladder is unremarkable. 3.5 cm left renal cyst. Stomach/Bowel: Normal appearance of the stomach and duodenum. No small bowel dilatation. 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 Status post partial colectomy. Vascular/Lymphatic: Aortic atherosclerosis. No enlarged abdominal or pelvic lymph nodes. Reproductive: Prostate is unremarkable. Other: Bilateral fat containing inguinal hernias. Musculoskeletal: No acute or significant osseous findings. IMPRESSION: 1. No acute abnormality of the chest, abdomen or pelvis. 2. Status post partial colectomy.  No evidence of  obstruction. Aortic Atherosclerosis (ICD10-I70.0). Electronically Signed   By: Ulyses Jarred M.D.   On: 12/01/2020 02:20    ASSESSMENT AND PLAN: Transverse colon cancer, stage IIIb (T3N1a), status post a partial transverse colectomy and end colostomy 09/10/2020 CT abdomen/pelvis 09/04/2020- possible transverse colon mass with small regional lymph nodes Colonoscopy 09/05/2020- obstructing transverse colon mass-biopsy invasive adenocarcinoma, mass could not be passed CT chest 09/09/2020-no evidence of metastatic disease, 3 x 2 mm subpleural nodule in the right upper lobe likely benign subpleural lymph node Partial transverse colectomy 09/10/2020, transverse colon tumor, no lymphovascular perineural invasion, 1/19 lymph nodes positive, negative margins, MSI stable, no loss of mismatch repair protein expression Cycle 1 CAPOX 10/08/2020, oxaliplatin dose reduced to 100 mg per metered squared secondary to renal insufficiency Cycle 2 CAPOX 10/29/2020 Cycle 3 CAPOX 11/19/2020, capecitabine dose reduced secondary to hand/foot syndrome, oxaliplatin dose reduced secondary to renal failure and poor performance status CT chest/abdomen/pelvis without contrast 12/01/2020-no acute abnormality in the chest, abdomen, or pelvis.  No evidence of obstruction. Resection of a frontal meningioma 09/01/2016 Diabetes Rectal polyp- tubular adenoma on colonoscopy 09/05/2020 Hypertension Diabetic neuropathy Epilepsy Family history of breast and prostate cancer Admission with acute renal failure 10/01/2020-secondary to lack of oral intake and lisinopril, improved with intravenous hydration and holding lisinopril Admission 12/01/2020-sepsis, diarrhea HHS, A. fib with RVR, abdominal pain  Mr. Hattabaugh was admitted with hyperglycemia, dehydration, and diarrhea.  I suspect his clinical presentation is related to chemotherapy induced enteritis in the setting of a high output ostomy and diabetes.  The stool is  negative for C. difficile.  He is now  at day 16 following cycle 3 CAPOX.  Diarrhea has improved.  The plan is to add tincture of opium for progressive diarrhea.  Diarrhea is likely secondary to capecitabine induced enteritis. The altered mental status this morning is most likely related to IV lorazepam.  Recommendations: 1.  Continue Magic mouthwash 2.  Moisturizers, skin care 3.  Continue intravenous hydration 4.  Advance diet, out of bed 5.  Continue Imodium and Lomotil, can add tincture of opium if the diarrhea does not improve today. 6.  Hold benzodiazepines    LOS: 3 days   Betsy Coder, MD 12/04/20

## 2020-12-04 NOTE — Progress Notes (Signed)
    CC: Abdominal pain  Subjective: Sleepy and tired this AM.  Nurses note he probably had an aspiration earlier this a.m. after swallowing some pills.  He is not complaining of his abdominal pain is get good stool output stool 825 (7/25)>>1525>>1475(7/27) Objective: Vital signs in last 24 hours: Temp:  [97.3 F (36.3 C)-100 F (37.8 C)] 100 F (37.8 C) (07/28 0400) Pulse Rate:  [98-101] 98 (07/27 1600) Resp:  [17-34] 29 (07/28 0600) BP: (92-159)/(28-76) 103/64 (07/28 0600) SpO2:  [95 %-100 %] 99 % (07/28 0400) Last BM Date: 12/03/20 2300 IV Urine 875  stool 825 (7/25)>>1525>>1475(7/27) T-max 100@0300  hrs this AM.  Low-grade tachycardia heart rate in the 100s.  BP stable.  Placed on high flow nasal cannula @ 2300 hrs. last night   Intake/Output from previous day: 07/27 0701 - 07/28 0700 In: 2343.1 [I.V.:1813.4; IV Piggyback:529.8] Out: 2350 [Urine:875; QQPYP:9509] Intake/Output this shift: No intake/output data recorded.  General appearance: Sleepy this AM.  Not complaining at all. Resp: Clear anterior GI: Large abdomen, he is nontender to palpation.  Currently his ostomy bag is empty but the ostomy looks good output listed above.  Incisions are healed nicely.  Lab Results:  Recent Labs    12/02/20 0308 12/03/20 0251  WBC 4.4 5.2  HGB 10.2* 9.2*  HCT 28.6* 26.3*  PLT 179 201    BMET Recent Labs    12/03/20 0251 12/04/20 0522  NA 139 139  K 4.4 4.8  CL 112* 112*  CO2 20* 21*  GLUCOSE 217* 218*  BUN 48* 43*  CREATININE 1.44* 1.49*  CALCIUM 8.3* 8.0*   PT/INR No results for input(s): LABPROT, INR in the last 72 hours.  Recent Labs  Lab 11/27/20 2040 11/30/20 2205  AST 17 26  ALT 18 29  ALKPHOS 72 67  BILITOT 1.1 1.4*  PROT 6.7 6.8  ALBUMIN 3.7 3.3*     Lipase     Component Value Date/Time   LIPASE 24 12/01/2020 0018     Medications:  chlorhexidine  15 mL Mouth Rinse BID   Chlorhexidine Gluconate Cloth  6 each Topical Daily   diltiazem   30 mg Oral Q6H   diphenoxylate-atropine  10 mL Oral QID   gabapentin  300 mg Oral TID   heparin  5,000 Units Subcutaneous Q8H   hydrocerin   Topical BID   insulin aspart  0-15 Units Subcutaneous Q4H   insulin glargine-yfgn  10 Units Subcutaneous Daily   loperamide HCl  2 mg Oral TID   mouth rinse  15 mL Mouth Rinse q12n4p    Assessment/Plan  Abdominal pain Stage IIIb colon cancer, on chemotherapy High output colostomy  - Lomotil 10 mg 3 times daily 7/27>> 10 mg 4 times daily today  - Imodium 2 mg x 3 7/27 >>  S/P exoratory laparotomy with transverse colectomy/colostomy 09/10/2020 Dr. Reather Laurence  -Plan: Continue medical management. On Lomotil and Imodium    FEN: N.p.o. ID: Maxipime 7/25>> day 2; vancomycin x1 7/25 DVT: Heparin/SCDs     Dehydration  -Creatinine 2.12>> 1.73>> 1.96>> 1.44>> 1.49(7/28) Atrial fibrillation  -Cardizem drip Type 2 diabetes Hypertension Hx epilepsy  -Keppra S/p craniotomy 2018 for meningioma Possible aspiration pneumonia  -Interval development mild linear atelectasis within the mid left lung on CXR 7/28        LOS: 3 days    Chauna Osoria 12/04/2020 Please see Amion

## 2020-12-04 NOTE — Evaluation (Signed)
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   Dizziness    Dyspnea    pt states d/t weight   Epilepsy ([place])    takes Keppra daily   GERD (gastroesophageal reflux disease)    takes Omeprazole daily   Headache    Hyperlipidemia    takes Atorvastatin daily   Hypertension    takes Lotrel daily   Peripheral edema    takes Lasix daily   Peripheral neuropathy    takes Gabapentin daily   Pneumonia 3 yrs ago   hx of   Seizures ([place])    Sleep apnea    Urinary frequency    Past Surgical History:  Past Surgical History:  Procedure Laterality Date   BIOPSY  09/05/2020   Procedure: BIOPSY;  Surgeon: Thomas Shipper, MD;  Location: [facility] ENDOSCOPY;  Service: Endoscopy;;   CARDIAC CATHETERIZATION  7 yrs ago   COLONOSCOPY WITH PROPOFOL N/A 09/05/2020   Procedure: COLONOSCOPY WITH PROPOFOL;  Surgeon: Thomas Shipper, MD;  Location: [facility] ENDOSCOPY;  Service: Endoscopy;  Laterality: N/A;   CRANIOTOMY N/A 09/01/2016   Procedure: CRANIOTOMY TUMOR  LEFT PTERIONAL;  Surgeon: Thomas Pall, MD;  Location: [place];  Service: Neurosurgery;  Laterality: N/A;   cyst removed from chest      as a child   NO PAST SURGERIES     PARTIAL COLECTOMY N/A 09/10/2020   Procedure: TRANSVERSE COLECTOMY;  Surgeon: Thomas Oka, MD;  Location: [place];  Service: General;  Laterality: N/A;   POLYPECTOMY  09/05/2020   Procedure: POLYPECTOMY;   Surgeon: Thomas Shipper, MD;  Location: [facility] ENDOSCOPY;  Service: Endoscopy;;   PORTACATH PLACEMENT Right 10/01/2020   Procedure: INSERTION PORT-A-CATH;  Surgeon: Thomas Oka, MD;  Location: [place];  Service: General;  Laterality: Right;   SUBMUCOSAL TATTOO INJECTION  09/05/2020   Procedure: SUBMUCOSAL TATTOO INJECTION;  Surgeon: Thomas Shipper, MD;  Location: [place];  Service: Endoscopy;;   HPI:  Per Md note, "Thomas [name] was admitted to the hospital with working diagnosis of sepsis syndrome or lactic acidosis complicated with atrial fibrillation with rapid ventricular response"  Pt with PMH + for colon cancer on chemo, 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  Swallow evaluation ordered due to concerns for pt aspirating with po medications.  CXR showed Interval development of mild linear atelectasis within the mid left  lung since the prior study 7/28 am.   Assessment / Plan / Recommendation Clinical Impression  Pt's mentation at this time is altered (falling asleep during session requiring max verbal/visual cues to awaken) with very congested reflexive cough that is not able to clear audible chest congestion.  Reflexive cough is much weaker though pt makes attempts.  With po trials after oral care, pt is largely not eliciting swallow. Excessive oral holding requiring oral suction of right lateral sulci retention and right anterior loss of boluses noted.  Coughing observed after minimal po pt did swallow *only 20% of 6 opportunities.    Dry spoon stimulation to tongue did not aid to elicit swallow.  Increase in congestion noted after medication administration. RN informed SLP that pt is receiving some of his Cardizem by mouth as his heart rate has been under control with  medication. Concern for severe oropharyngeal dysphagia present at this time with poor airway clearance of secretions.  Highly recommend he be npo x single small ice chips after mouth care and absolutely necessary medications crushed.  SLP will follow up in am to see if pt may be appropriate for MBS - but at this time his mentation is barrier.   Pt's nurse reports she has not been giving pt food - only medications today- due to concern for aspiration/dysphagia.   Educated pt to recommendations, posted signs and informed RN/MD. Will follow up next date 7/29 am.  Given h/o left MCA CVA, ? If this could be source of right buccal region pocketing, no family present.  Thanks for this consult. SLP Visit Diagnosis: Dysphagia, oropharyngeal phase (R13.12);Dysphagia, unspecified (R13.10)    Aspiration Risk  Risk for inadequate nutrition/hydration;Severe aspiration risk    Diet Recommendation NPO;Ice chips PRN after oral care (necessary medications crushed or prefer IV)        Other  Recommendations Oral Care Recommendations: Oral care QID   Follow up Recommendations    SNF    Frequency and Duration min 1 x/week  2 weeks       Prognosis Prognosis for Safe Diet Advancement: Fair Barriers to Reach Goals: Severity of deficits      Swallow Study   General Date of Onset: 12/04/20 HPI: Per Md note, "Thomas [name] was admitted to the hospital with working diagnosis of sepsis syndrome or lactic acidosis complicated with atrial fibrillation with rapid ventricular response"  Pt with PMH + for colon cancer on chemo, Resection of a frontal meningioma 09/01/2016, Transverse colon cancer, stage IIIb (T3N1a), status post a partial transverse colectomy and end colostomy 09/10/2020, DM, recent falls, decreased appetite and poor intake, dehydration, seizures, obesity, sleep apnea.  Swallow evaluation ordered due to concerns for pt aspirating with po medications.  CXR showed Interval development of mild linear atelectasis  within the mid left  lung since the prior study 7/28 am. Diet Prior to this Study: Dysphagia 1 (puree);Thin liquids Temperature Spikes Noted: No Respiratory Status: Nasal cannula History of Recent Intubation: No Behavior/Cognition: Lethargic/Drowsy;Distractible;Requires cueing;Doesn't follow directions Oral Cavity Assessment: Excessive secretions Oral Care Completed by SLP: Yes Oral Cavity - Dentition: Adequate natural dentition Self-Feeding Abilities: Total assist Patient Positioning: Upright in bed Baseline Vocal Quality: Suspected CN X (Vagus) involvement;Low vocal intensity;Wet Volitional Cough: Congested;Weak Volitional Swallow: Unable to elicit    Oral/Motor/Sensory Function Overall Oral Motor/Sensory Function: Generalized oral weakness (? mild right facial asymmetry - lateral sulci pocketing in right buccal region)   Ice Chips Ice chips: Impaired Presentation: Spoon Oral Phase Functional Implications: Oral holding Pharyngeal Phase Impairments: Suspected delayed Swallow;Wet Vocal Quality   Thin Liquid Thin Liquid: Not tested    Nectar Thick Nectar Thick Liquid: Not tested   Honey Thick Honey Thick Liquid: Not tested   Puree Puree: Impaired (RN giving pt medications during session) Presentation: Spoon  Oral Phase Impairments: Reduced labial seal;Poor awareness of bolus;Reduced lingual movement/coordination Oral Phase Functional Implications: Right lateral sulci pocketing;Oral holding Pharyngeal Phase Impairments: Suspected delayed Swallow;Cough - Immediate;Cough - Delayed;Wet Vocal Quality   Solid     Solid: Not tested      [name] 12/04/2020,6:32 PM  [name], MS [facility] [place] Office [phone] Pager [phone]

## 2020-12-04 NOTE — Progress Notes (Signed)
Pt. Pulled out  port needle , re accessed, Dressed and secured. RN at bedside aware.

## 2020-12-04 NOTE — Progress Notes (Addendum)
Upon assessment RN noticed pt port was leaking. RN stopped fluids & ordered IV team consult. Port needle was not inside of the port. RN placed safety mittens on pt. RN will continue to monitor.

## 2020-12-04 NOTE — Progress Notes (Addendum)
PROGRESS NOTE    Thomas [name]  HQI:[id number] DOB: 05/05/1956 DOA: 11/30/2020 PCP: [name], NP    Brief Narrative:  Thomas [name] was admitted to the hospital with working diagnosis of sepsis syndrome with lactic acidosis complicated with atrial fibrillation with rapid ventricular response. Ruled out for sepsis.   65 year old male who presented with abdominal pain, anorexia and weakness.  Intermittent symptoms for about 2 weeks, more severe over the last 12 hours.  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 He was diagnosed with sepsis syndrome and received broad-spectrum antibiotic therapy, transferred from [facility] ED to [facility].   Patient received supportive medical therapy including broad-spectrum antibiotic therapy. For his atrial fibrillation he required diltiazem and amiodarone.   Transferred to [facility] 7/27.  Patient continue to be somnolent and confused, no agitation. Poor oral intake and swallow dysfunction.   Sepsis ruled out and discontinue antibiotic therapy.   Assessment & Plan:   Principal Problem:   Diabetes mellitus with hyperosmolarity without hyperglycemic hyperosmolar nonketotic coma (HCC) Active Problems:   Lactic acidosis   Acute renal failure superimposed on stage 2 chronic kidney disease (HCC)   Tachycardia   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 (HCC)   Adverse effect of chemotherapy   Pressure injury of skin   Atrial fibrillation with RVR (HCC)   Malnutrition of moderate degree   Acute metabolic encephalopathy     Systemic inflammatory response syndrome/ acute metabolic encephalopathy. T max 37,8, blood pressure has been stable with systolic 132'G, HR 98.  Follow chest radiograph personally reviewed, noted left base atelectasis.    No signs of systemic  infection, will dc antibiotic therapy this am and continue close monitoring.  Sepsis ruled out.   Swallow evaluation. This am had lorazepam IV, will discontinue for now to prevent oversedation Continue Keppra per home dose.   2. Atrial fibrillation with RVR. Continue diltiazem 30 mg po qid. Dose this am was held due to swallow dysfunction.  Continue with telemetry monitoring.   Echocardiogram with preserved LF systolic function. Anticoagulation with apixaban.    3. Colon cancer. Sp colostomy. Supportive medical care, follow up with Dr Learta Codding. Recent dose of chemotherapy.    4. AKI on CKD stage 2.  Renal function this am with serum cr at 1,49 with K at 3,8 and serum bicarbonate at 21. No signs of volume overload. Poor oral intake, continue gentle hydration.    5. T2DM hyperglycemia.  Fasting glucose this am 218, capillary 196 and 174.  Continue basal insulin and sliding scale.    6. Seizures Continue with keppra.   7. Obesity class 2/ moderate calorie protein malnutrition. Stage 2 coccyx pressure ulcer (present on admission), BMI is 37. Moderate calorie protein malnutrition.   Continue with nutritional supplements.  Consult speech, PT and OT.   Patient continue to be at high risk for worsening encephalopathy   Status is: Inpatient  Remains inpatient appropriate because:Inpatient level of care appropriate due to severity of illness  Dispo: The patient is from: Home              Anticipated d/c is to: Home              Patient currently is not medically stable to d/c.   Difficult to place patient No   DVT prophylaxis: Apixaban   Code Status:   full  Family Communication:  No family at the bedside      Nutrition Status: Nutrition Problem: Moderate Malnutrition Etiology: chronic illness, cancer and cancer related treatments Signs/Symptoms: mild fat depletion, mild muscle depletion, percent weight loss Percent weight loss: 11 % Interventions: Refer to RD note for  recommendations     Skin Documentation: Pressure Injury 12/01/20 Coccyx Mid Stage 2 -  Partial thickness loss of dermis presenting as a shallow open injury with a red, pink wound bed without slough. stage 2 decubitus to coxxyx (Active)  12/01/20 0900  Location: Coccyx  Location Orientation: Mid  Staging: Stage 2 -  Partial thickness loss of dermis presenting as a shallow open injury with a red, pink wound bed without slough.  Wound Description (Comments): stage 2 decubitus to coxxyx  Present on Admission: Yes     Subjective: Patient this am somnolent and mild confusion, able to answer to simple questions and follow simple commands, episode of swallow dysfunction this am/   Objective: Vitals:   12/04/20 0300 12/04/20 0400 12/04/20 0500 12/04/20 0600  BP: 120/70 126/71 132/72 103/64  Pulse:      Resp: (!) 26 (!) 28 (!) 26 (!) 29  Temp:  100 F (37.8 C)    TempSrc:  Axillary    SpO2:  99%    Weight:      Height:        Intake/Output Summary (Last 24 hours) at 12/04/2020 0906 Last data filed at 12/04/2020 0700 Gross per 24 hour  Intake 3018.13 ml  Output 2150 ml  Net 868.13 ml   Filed Weights   12/01/20 0825  Weight: 111.8 kg    Examination:   General: Not in pain or dyspnea, deconditioned and ill looking appearing  Neurology: somnolent but easy to arouse.  E ENT: mild pallor, no icterus, oral mucosa moist Cardiovascular: No JVD. S1-S2 present, rhythmic, no gallops, rubs, or murmurs. No lower extremity edema. Pulmonary: positive breath sounds bilaterally, adequate air movement, no wheezing, rhonchi or rales. Gastrointestinal. Abdomen soft and non tender. Colostomy bag in place.  Skin. No rashes Musculoskeletal: no joint deformities     Data Reviewed: I have personally reviewed following labs and imaging studies  CBC: Recent Labs  Lab 11/27/20 2040 11/30/20 2205 12/01/20 0432 12/02/20 0308 12/03/20 0251  WBC 5.4 4.8 4.1 4.4 5.2  NEUTROABS  --   --   --   1.9  --   HGB 11.6* 13.6 11.7* 10.2* 9.2*  HCT 32.9* 38.4* 33.1* 28.6* 26.3*  MCV 73.6* 75.1* 75.2* 74.7* 75.4*  PLT 202 265 212 179 784   Basic Metabolic Panel: Recent Labs  Lab 12/01/20 0432 12/01/20 1226 12/01/20 1549 12/02/20 0308 12/03/20 0251 12/04/20 0522  NA 132* 136  --  137 139 139  K 5.1 4.5  --  4.7 4.4 4.8  CL 106 110  --  111 112* 112*  CO2 15* 19*  --  19* 20* 21*  GLUCOSE 306* 203*  --  250* 217* 218*  BUN 58* 56*  --  56* 48* 43*  CREATININE 2.12* 1.73*  --  1.96* 1.44* 1.49*  CALCIUM 8.2* 8.3*  --  8.2* 8.3* 8.0*  MG 1.7  --  2.1 1.9 2.3  --   PHOS  --   --   --   --  2.7  --    GFR: Estimated Creatinine Clearance: 60.8 mL/min (A) (by C-G formula based on SCr of 1.49 mg/dL (H)). Liver Function Tests: Recent Labs  Lab  11/27/20 2040 11/30/20 2205  AST 17 26  ALT 18 29  ALKPHOS 72 67  BILITOT 1.1 1.4*  PROT 6.7 6.8  ALBUMIN 3.7 3.3*   Recent Labs  Lab 12/01/20 0018  LIPASE 24   No results for input(s): AMMONIA in the last 168 hours. Coagulation Profile: Recent Labs  Lab 11/30/20 2205  INR 1.5*   Cardiac Enzymes: No results for input(s): CKTOTAL, CKMB, CKMBINDEX, TROPONINI in the last 168 hours. BNP (last 3 results) No results for input(s): PROBNP in the last 8760 hours. HbA1C: No results for input(s): HGBA1C in the last 72 hours. CBG: Recent Labs  Lab 12/03/20 1618 12/03/20 2019 12/03/20 2352 12/04/20 0352 12/04/20 0749  GLUCAP 218* 223* 192* 196* 174*   Lipid Profile: No results for input(s): CHOL, HDL, LDLCALC, TRIG, CHOLHDL, LDLDIRECT in the last 72 hours. Thyroid Function Tests: No results for input(s): TSH, T4TOTAL, FREET4, T3FREE, THYROIDAB in the last 72 hours. Anemia Panel: No results for input(s): VITAMINB12, FOLATE, FERRITIN, TIBC, IRON, RETICCTPCT in the last 72 hours.    Radiology Studies: I have reviewed all of the imaging during this hospital visit personally     Scheduled Meds:  chlorhexidine  15 mL  Mouth Rinse BID   Chlorhexidine Gluconate Cloth  6 each Topical Daily   diltiazem  30 mg Oral Q6H   diphenoxylate-atropine  10 mL Oral QID   gabapentin  300 mg Oral TID   heparin  5,000 Units Subcutaneous Q8H   hydrocerin   Topical BID   insulin aspart  0-15 Units Subcutaneous Q4H   insulin glargine-yfgn  10 Units Subcutaneous Daily   loperamide HCl  2 mg Oral TID   mouth rinse  15 mL Mouth Rinse q12n4p   Continuous Infusions:  sodium chloride 75 mL/hr at 12/04/20 0700   sodium chloride 10 mL/hr at 12/04/20 0700   sodium chloride Stopped (12/02/20 0308)   ceFEPime (MAXIPIME) IV Stopped (12/04/20 0546)   chlorproMAZINE (THORAZINE) IV Stopped (12/03/20 0544)   levETIRAcetam Stopped (12/03/20 2219)   promethazine (PHENERGAN) injection (IM or IVPB) Stopped (12/01/20 2020)     LOS: 3 days        Thomas Gittens Gerome Apley, MD

## 2020-12-04 NOTE — Progress Notes (Signed)
ANTICOAGULATION CONSULT NOTE - Initial Consult  Pharmacy Consult for Eliquis Indication: atrial fibrillation  No Known Allergies  Patient Measurements: Height: 5\' 8"  (172.7 cm) Weight: 111.8 kg (246 lb 7.6 oz) IBW/kg (Calculated) : 68.4  Vital Signs: Temp: 100 F (37.8 C) (07/28 0400) Temp Source: Axillary (07/28 0400) BP: 103/64 (07/28 0600)  Labs: Recent Labs    12/02/20 0308 12/03/20 0251 12/04/20 0522  HGB 10.2* 9.2* 9.7*  HCT 28.6* 26.3* 27.8*  PLT 179 201 253  CREATININE 1.96* 1.44* 1.49*    Estimated Creatinine Clearance: 60.8 mL/min (A) (by C-G formula based on SCr of 1.49 mg/dL (H)).   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   Dizziness    Dyspnea    pt states d/t weight   Epilepsy ([place])    takes Keppra daily   GERD (gastroesophageal reflux disease)    takes Omeprazole daily   Headache    Hyperlipidemia    takes Atorvastatin daily   Hypertension    takes Lotrel daily   Peripheral edema    takes Lasix daily   Peripheral neuropathy    takes Gabapentin daily   Pneumonia 3 yrs ago   hx of   Seizures (HCC)    Sleep apnea    Urinary frequency    Assessment: 65 y/o M with a h/o colon cancer s/p colectomy on chemo admitted with SIRS. Pharmacy consulted to initiate Eliquis due to new-onset atrial fibrillation with rapid ventricular response. Last dose of SQH this am at 0500. Hgb 9.7, stable  Plan:  Will start Eliquis 5 mg bid.  Will educate patient prior to discharge.  Pharmacy will follow remotely.   Thank you for allowing pharmacy to participate in this patient's care.    Ulice Dash D 12/04/2020,10:38 AM

## 2020-12-05 ENCOUNTER — Inpatient Hospital Stay (HOSPITAL_COMMUNITY): Payer: Medicare HMO

## 2020-12-05 ENCOUNTER — Inpatient Hospital Stay (HOSPITAL_COMMUNITY)
Admit: 2020-12-05 | Discharge: 2020-12-05 | Disposition: A | Discharge status: Home / Self Care | Payer: Medicare HMO | Attending: Internal Medicine | Admitting: Internal Medicine

## 2020-12-05 DIAGNOSIS — R569 Unspecified convulsions: Secondary | ICD-10-CM

## 2020-12-05 DIAGNOSIS — T451X5A Adverse effect of antineoplastic and immunosuppressive drugs, initial encounter: Secondary | ICD-10-CM | POA: Diagnosis not present

## 2020-12-05 DIAGNOSIS — G9341 Metabolic encephalopathy: Secondary | ICD-10-CM | POA: Diagnosis not present

## 2020-12-05 DIAGNOSIS — E1100 Type 2 diabetes mellitus with hyperosmolarity without nonketotic hyperglycemic-hyperosmolar coma (NKHHC): Secondary | ICD-10-CM | POA: Diagnosis not present

## 2020-12-05 DIAGNOSIS — E872 Acidosis: Secondary | ICD-10-CM | POA: Diagnosis not present

## 2020-12-05 DIAGNOSIS — I4891 Unspecified atrial fibrillation: Secondary | ICD-10-CM | POA: Diagnosis not present

## 2020-12-05 LAB — GLUCOSE, CAPILLARY
Glucose-Capillary: 156 mg/dL — ABNORMAL HIGH (ref 70–99)
Glucose-Capillary: 161 mg/dL — ABNORMAL HIGH (ref 70–99)
Glucose-Capillary: 174 mg/dL — ABNORMAL HIGH (ref 70–99)
Glucose-Capillary: 174 mg/dL — ABNORMAL HIGH (ref 70–99)
Glucose-Capillary: 201 mg/dL — ABNORMAL HIGH (ref 70–99)
Glucose-Capillary: 225 mg/dL — ABNORMAL HIGH (ref 70–99)

## 2020-12-05 LAB — CBC WITH DIFFERENTIAL/PLATELET
Abs Immature Granulocytes: 1.1 10*3/uL — ABNORMAL HIGH (ref 0.00–0.07)
Band Neutrophils: 8 %
Basophils Absolute: 0 10*3/uL (ref 0.0–0.1)
Basophils Relative: 0 %
Eosinophils Absolute: 0.1 10*3/uL (ref 0.0–0.5)
Eosinophils Relative: 2 %
HCT: 27.6 % — ABNORMAL LOW (ref 39.0–52.0)
Hemoglobin: 9.6 g/dL — ABNORMAL LOW (ref 13.0–17.0)
Lymphocytes Relative: 23 %
Lymphs Abs: 1 10*3/uL (ref 0.7–4.0)
MCH: 27 pg (ref 26.0–34.0)
MCHC: 34.8 g/dL (ref 30.0–36.0)
MCV: 77.5 fL — ABNORMAL LOW (ref 80.0–100.0)
Metamyelocytes Relative: 8 %
Monocytes Absolute: 0.4 10*3/uL (ref 0.1–1.0)
Monocytes Relative: 9 %
Myelocytes: 18 %
Neutro Abs: 1.8 10*3/uL (ref 1.7–7.7)
Neutrophils Relative %: 32 %
Platelets: 264 10*3/uL (ref 150–400)
RBC: 3.56 MIL/uL — ABNORMAL LOW (ref 4.22–5.81)
RDW: 22.1 % — ABNORMAL HIGH (ref 11.5–15.5)
WBC: 4.4 10*3/uL (ref 4.0–10.5)
nRBC: 29.3 % — ABNORMAL HIGH (ref 0.0–0.2)

## 2020-12-05 LAB — BASIC METABOLIC PANEL
Anion gap: 10 (ref 5–15)
BUN: 55 mg/dL — ABNORMAL HIGH (ref 8–23)
CO2: 17 mmol/L — ABNORMAL LOW (ref 22–32)
Calcium: 7.9 mg/dL — ABNORMAL LOW (ref 8.9–10.3)
Chloride: 111 mmol/L (ref 98–111)
Creatinine, Ser: 2.6 mg/dL — ABNORMAL HIGH (ref 0.61–1.24)
GFR, Estimated: 27 mL/min — ABNORMAL LOW (ref >60)
Glucose, Bld: 221 mg/dL — ABNORMAL HIGH (ref 70–99)
Potassium: 5 mmol/L (ref 3.5–5.1)
Sodium: 138 mmol/L (ref 135–145)

## 2020-12-05 MED ORDER — OPIUM 10 MG/ML (1%) PO TINC
0.6000 mL | Freq: Four times a day (QID) | ORAL | Status: DC
Start: 2020-12-05 — End: 2020-12-07

## 2020-12-05 MED ORDER — HALOPERIDOL LACTATE 5 MG/ML IJ SOLN
1.0000 mg | Freq: Four times a day (QID) | INTRAMUSCULAR | Status: DC | PRN
  Administered 2020-12-05 – 2020-12-06 (×4): 1 mg via INTRAMUSCULAR
  Filled 2020-12-05 (×4): qty 1

## 2020-12-05 MED ORDER — SODIUM CHLORIDE 0.45 % IV SOLN: INTRAVENOUS | Status: DC

## 2020-12-05 MED ORDER — METOPROLOL TARTRATE 5 MG/5ML IV SOLN
2.5000 mg | INTRAVENOUS | Status: AC
  Administered 2020-12-06: 2.5 mg via INTRAVENOUS
  Filled 2020-12-05: qty 5

## 2020-12-05 MED ORDER — INSULIN GLARGINE-YFGN 100 UNIT/ML ~~LOC~~ SOLN
5.0000 [IU] | Freq: Every day | SUBCUTANEOUS | Status: DC
  Filled 2020-12-05: qty 0.05

## 2020-12-05 MED ORDER — THIAMINE HCL 100 MG/ML IJ SOLN
250.0000 mg | Freq: Two times a day (BID) | INTRAVENOUS | Status: DC
  Administered 2020-12-05 – 2020-12-21 (×34): 250 mg via INTRAVENOUS
  Filled 2020-12-05 (×22): qty 2.5
  Filled 2020-12-05: qty 2
  Filled 2020-12-05 (×18): qty 2.5

## 2020-12-05 MED ORDER — HALOPERIDOL LACTATE 5 MG/ML IJ SOLN
3.0000 mg | Freq: Once | INTRAMUSCULAR | Status: AC | PRN
  Administered 2020-12-05: 3 mg via INTRAVENOUS
  Filled 2020-12-05: qty 1

## 2020-12-05 MED ORDER — FENTANYL CITRATE (PF) 100 MCG/2ML IJ SOLN
12.5000 ug | INTRAMUSCULAR | Status: DC | PRN
  Administered 2020-12-05 – 2020-12-06 (×7): 12.5 ug via INTRAVENOUS
  Filled 2020-12-05 (×8): qty 2

## 2020-12-05 MED ORDER — HALOPERIDOL 1 MG PO TABS: 1.0000 mg | ORAL_TABLET | Freq: Four times a day (QID) | ORAL | Status: DC | PRN

## 2020-12-05 MED ORDER — SODIUM CHLORIDE 0.9 % IV SOLN
12.5000 mg | Freq: Four times a day (QID) | INTRAVENOUS | Status: DC | PRN
  Filled 2020-12-05: qty 0.5

## 2020-12-05 MED ORDER — OLANZAPINE 10 MG IM SOLR
5.0000 mg | Freq: Every day | INTRAMUSCULAR | Status: DC
  Administered 2020-12-05 – 2020-12-10 (×6): 5 mg via INTRAMUSCULAR
  Filled 2020-12-05 (×6): qty 10

## 2020-12-05 NOTE — Progress Notes (Addendum)
HEMATOLOGY-ONCOLOGY PROGRESS NOTE  SUBJECTIVE: Thomas [name] continues to have high output from his ostomy.  He continues to have intermittent abdominal pain.  Oncology History  Colon cancer ([place])  09/23/2020 Initial Diagnosis   Colon cancer ([place])    09/23/2020 Cancer Staging   Staging form: Colon and Rectum, AJCC 8th Edition - Pathologic: Stage IIIB (pT3, pN1a, cM0) - Signed by Ladell Pier, MD on 09/23/2020  Total positive nodes: 1  Histologic grading system: 4 grade system  Histologic grade (G): G1  Residual tumor (R): R0 - None  Lymph-vascular invasion (LVI): LVI not present (absent)/not identified  Tumor deposits (TD): Absent  Perineural invasion (PNI): Absent  Microsatellite instability (MSI): Stable    10/08/2020 -  Chemotherapy    Patient is on Treatment Plan: COLORECTAL XELOX (CAPEOX) Q21D         PHYSICAL EXAMINATION:  Vitals:   12/05/20 0600 12/05/20 0958  BP: (!) 105/55   Pulse:    Resp: (!) 24   Temp:  99.4 F (37.4 C)  SpO2:     Filed Weights   12/01/20 0825  Weight: 111.8 kg    Intake/Output from previous day: 07/28 0701 - 07/29 0700 In: 1720.2 [I.V.:1522.8; IV Piggyback:197.4] Out: 2050 [Urine:675; Stool:1375]  GENERAL:alert, no distress and comfortable SKIN: Dryness of the palms and soles, erythema with superficial desquamation and linear ulcers at the hands, superficial skin breakdown at the scrotum, hyperpigmentation of the hands EYES: normal, Conjunctiva are pink and non-injected, sclera clear OROPHARYNX: He has several mouth sores over the palate, tongue, and lower lip that appear to be healing LUNGS: clear to auscultation and percussion with normal breathing effort HEART: Tachycardic, irregular ABDOMEN: Right abdomen colostomy with liquid stool NEURO: alert, not oriented  Port-A-Cath without erythema  LABORATORY DATA:  I have reviewed the data as listed CMP Latest Ref Rng & Units 12/05/2020 12/04/2020 12/03/2020  Glucose 70 - 99  mg/dL 221(H) 218(H) 217(H)  BUN 8 - 23 mg/dL 55(H) 43(H) 48(H)  Creatinine 0.61 - 1.24 mg/dL 2.60(H) 1.49(H) 1.44(H)  Sodium 135 - 145 mmol/L 138 139 139  Potassium 3.5 - 5.1 mmol/L 5.0 4.8 4.4  Chloride 98 - 111 mmol/L 111 112(H) 112(H)  CO2 22 - 32 mmol/L 17(L) 21(L) 20(L)  Calcium 8.9 - 10.3 mg/dL 7.9(L) 8.0(L) 8.3(L)  Total Protein 6.5 - 8.1 g/dL - - -  Total Bilirubin 0.3 - 1.2 mg/dL - - -  Alkaline Phos 38 - 126 U/L - - -  AST 15 - 41 U/L - - -  ALT 0 - 44 U/L - - -    Lab Results  Component Value Date   WBC 4.4 12/05/2020   HGB 9.6 (L) 12/05/2020   HCT 27.6 (L) 12/05/2020   MCV 77.5 (L) 12/05/2020   PLT 264 12/05/2020   NEUTROABS 1.8 12/05/2020    DG CHEST PORT 1 VIEW  Result Date: 12/04/2020 CLINICAL DATA:  Vomiting with possible aspiration. EXAM: PORTABLE CHEST 1 VIEW COMPARISON:  November 30, 2020 FINDINGS: There is stable right-sided venous Port-A-Cath positioning. Mild linear atelectasis is seen within the mid left lung. This represents a new finding when compared to the prior exam. There is no evidence of a pleural effusion or pneumothorax. The heart size and mediastinal contours are within normal limits. The visualized skeletal structures are unremarkable. IMPRESSION: Interval development of mild linear atelectasis within the mid left lung since the prior study. Electronically Signed   By: Virgina Norfolk M.D.   On: 12/04/2020 01:19  DG Chest Port 1 View  Result Date: 11/30/2020 CLINICAL DATA:  Questionable sepsis. Patient began treatment for colon cancer 3 months ago. Loss of appetite, severe abdominal pain, weakness, and back pain. EXAM: PORTABLE CHEST 1 VIEW COMPARISON:  11/27/2020 FINDINGS: Heart size and pulmonary vascularity are normal. Lungs clear. Power port type central venous catheter with tip over the low SVC region. No pneumothorax. No pleural effusions. Mediastinal contours appear intact. IMPRESSION: No active disease. Electronically Signed   By: Lucienne Capers M.D.   On: 11/30/2020 22:36   DG Chest Port 1 View  Result Date: 11/27/2020 CLINICAL DATA:  Weakness.  Fall today. EXAM: PORTABLE CHEST 1 VIEW COMPARISON:  Radiograph 11/15/2020 FINDINGS: Right chest port unchanged in position. Normal heart size and mediastinal contours. No acute or focal airspace disease no pleural fluid, pulmonary edema, or pneumothorax. No acute osseous abnormalities are seen. IMPRESSION: No acute chest findings. Electronically Signed   By: Keith Rake M.D.   On: 11/27/2020 22:09   DG Chest Portable 1 View  Result Date: 11/15/2020 CLINICAL DATA:  Fatigue, colon cancer EXAM: PORTABLE CHEST 1 VIEW COMPARISON:  10/01/2020 FINDINGS: Right Port-A-Cath remains in place, unchanged. Heart and mediastinal contours are within normal limits. No focal opacities or effusions. No acute bony abnormality. IMPRESSION: No active disease. Electronically Signed   By: Rolm Baptise M.D.   On: 11/15/2020 14:34   ECHOCARDIOGRAM COMPLETE  Result Date: 12/02/2020    ECHOCARDIOGRAM REPORT   Patient Name:   Thomas [name] Date of Exam: 12/02/2020 Medical Rec #:  [id number]    Height:       68.0 in Accession #:    [id number]   Weight:       246.5 lb Date of Birth:  23-Feb-1956    BSA:          2.234 m Patient Age:    65 years     BP:           136/77 mmHg Patient Gender: M            HR:           92 bpm. Exam Location:  Inpatient Procedure: 2D Echo, Color Doppler and Cardiac Doppler Indications:    I48.91* Unspecified atrial fibrillation  History:        Patient has no prior history of Echocardiogram examinations.                 Arrythmias:Atrial Fibrillation; Risk Factors:Hypertension,                 Diabetes, Dyslipidemia and Sleep Apnea.  Sonographer:    Raquel Sarna Senior RDCS Referring Phys: [phone] [facility]  Sonographer Comments: Technically difficult due to patient body habitus. IMPRESSIONS  1. Mild intracavitary gradient. Peak velocity 1.37 m/s. Peak gradient 7.5 mmHg. Left ventricular ejection  fraction, by estimation, is 60 to 65%. The left ventricle has normal function. The left ventricle has no regional wall motion abnormalities. There is moderate concentric left ventricular hypertrophy. Left ventricular diastolic parameters are consistent with Grade I diastolic dysfunction (impaired relaxation).  2. Right ventricular systolic function is normal. The right ventricular size is normal. There is normal pulmonary artery systolic pressure.  3. The mitral valve is normal in structure. No evidence of mitral valve regurgitation. No evidence of mitral stenosis.  4. The aortic valve is calcified. There is moderate calcification of the aortic valve. Aortic valve regurgitation is not visualized. No aortic stenosis is present.  5. The inferior  vena cava is normal in size with greater than 50% respiratory variability, suggesting right atrial pressure of 3 mmHg. FINDINGS  Left Ventricle: Mild intracavitary gradient. Peak velocity 1.37 m/s. Peak gradient 7.5 mmHg. Left ventricular ejection fraction, by estimation, is 60 to 65%. The left ventricle has normal function. The left ventricle has no regional wall motion abnormalities. The left ventricular internal cavity size was normal in size. There is moderate concentric left ventricular hypertrophy. Left ventricular diastolic parameters are consistent with Grade I diastolic dysfunction (impaired relaxation). Indeterminate filling pressures. Right Ventricle: The right ventricular size is normal. No increase in right ventricular wall thickness. Right ventricular systolic function is normal. There is normal pulmonary artery systolic pressure. The tricuspid regurgitant velocity is 1.72 m/s, and  with an assumed right atrial pressure of 8 mmHg, the estimated right ventricular systolic pressure is 03.0 mmHg. Left Atrium: Left atrial size was normal in size. Right Atrium: Right atrial size was normal in size. Pericardium: There is no evidence of pericardial effusion. Mitral  Valve: The mitral valve is normal in structure. No evidence of mitral valve regurgitation. No evidence of mitral valve stenosis. Tricuspid Valve: The tricuspid valve is normal in structure. Tricuspid valve regurgitation is trivial. No evidence of tricuspid stenosis. Aortic Valve: The aortic valve is calcified. There is moderate calcification of the aortic valve. Aortic valve regurgitation is not visualized. No aortic stenosis is present. Pulmonic Valve: The pulmonic valve was normal in structure. Pulmonic valve regurgitation is not visualized. No evidence of pulmonic stenosis. Aorta: The aortic root is normal in size and structure. Venous: The inferior vena cava is normal in size with greater than 50% respiratory variability, suggesting right atrial pressure of 3 mmHg. IAS/Shunts: No atrial level shunt detected by color flow Doppler.  LEFT VENTRICLE PLAX 2D LVIDd:         3.20 cm  Diastology LVIDs:         2.10 cm  LV e' medial:    4.68 cm/s LV PW:         1.30 cm  LV E/e' medial:  11.5 LV IVS:        1.50 cm  LV e' lateral:   6.85 cm/s LVOT diam:     2.00 cm  LV E/e' lateral: 7.9 LV SV:         43 LV SV Index:   19 LVOT Area:     3.14 cm  RIGHT VENTRICLE RV S prime:     13.50 cm/s TAPSE (M-mode): 1.6 cm LEFT ATRIUM             Index       RIGHT ATRIUM           Index LA diam:        3.20 cm 1.43 cm/m  RA Area:     12.50 cm LA Vol (A2C):   46.5 ml 20.82 ml/m RA Volume:   26.60 ml  11.91 ml/m LA Vol (A4C):   33.6 ml 15.04 ml/m LA Biplane Vol: 41.5 ml 18.58 ml/m  AORTIC VALVE LVOT Vmax:   98.65 cm/s LVOT Vmean:  79.600 cm/s LVOT VTI:    0.138 m  AORTA Ao Root diam: 3.10 cm MITRAL VALVE                TRICUSPID VALVE MV Area (PHT): 3.10 cm     TR Peak grad:   11.8 mmHg MV Decel Time: 245 msec     TR Vmax:  172.00 cm/s MV E velocity: 54.00 cm/s MV A velocity: 102.00 cm/s  SHUNTS MV E/A ratio:  0.53         Systemic VTI:  0.14 m                             Systemic Diam: 2.00 cm Skeet Latch MD  Electronically signed by Skeet Latch MD Signature Date/Time: 12/02/2020/5:47:16 PM    Final    CT CHEST ABDOMEN PELVIS WO CONTRAST  Result Date: 12/01/2020 CLINICAL DATA:  Abdominal pain and fever EXAM: CT CHEST, ABDOMEN AND PELVIS WITHOUT CONTRAST TECHNIQUE: Multidetector CT imaging of the chest, abdomen and pelvis was performed following the standard protocol without IV contrast. COMPARISON:  09/04/2020 FINDINGS: CT CHEST FINDINGS Cardiovascular: Calcific atherosclerosis of the aorta and coronary arteries. No pericardial effusion. Course and caliber of the aorta are normal. Right chest wall Port-A-Cath tip is at the cavoatrial junction. Mediastinum/Nodes: No enlarged mediastinal, hilar, or axillary lymph nodes. Thyroid gland, trachea, and esophagus demonstrate no significant findings. Lungs/Pleura: Lungs are clear. No pleural effusion or pneumothorax. Musculoskeletal: No chest wall mass or suspicious bone lesions identified. CT ABDOMEN PELVIS FINDINGS Hepatobiliary: No focal liver abnormality is seen. No gallstones, gallbladder wall thickening, or biliary dilatation. Pancreas: Unremarkable. No pancreatic ductal dilatation or surrounding inflammatory changes. Spleen: Normal in size without focal abnormality. Adrenals/Urinary Tract: Adrenal glands are unremarkable. Kidneys are normal, without renal calculi, focal lesion, or hydronephrosis. Bladder is unremarkable. 3.5 cm left renal cyst. Stomach/Bowel: Normal appearance of the stomach and duodenum. No small bowel dilatation. 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 Status post partial colectomy. Vascular/Lymphatic: Aortic atherosclerosis. No enlarged abdominal or pelvic lymph nodes. Reproductive: Prostate is unremarkable. Other: Bilateral fat containing inguinal hernias. Musculoskeletal: No acute or significant osseous findings. IMPRESSION: 1. No acute abnormality of the chest, abdomen or pelvis. 2. Status post partial colectomy.  No evidence of  obstruction. Aortic Atherosclerosis (ICD10-I70.0). Electronically Signed   By: Ulyses Jarred M.D.   On: 12/01/2020 02:20    ASSESSMENT AND PLAN: Transverse colon cancer, stage IIIb (T3N1a), status post a partial transverse colectomy and end colostomy 09/10/2020 CT abdomen/pelvis 09/04/2020- possible transverse colon mass with small regional lymph nodes Colonoscopy 09/05/2020- obstructing transverse colon mass-biopsy invasive adenocarcinoma, mass could not be passed CT chest 09/09/2020-no evidence of metastatic disease, 3 x 2 mm subpleural nodule in the right upper lobe likely benign subpleural lymph node Partial transverse colectomy 09/10/2020, transverse colon tumor, no lymphovascular perineural invasion, 1/19 lymph nodes positive, negative margins, MSI stable, no loss of mismatch repair protein expression Cycle 1 CAPOX 10/08/2020, oxaliplatin dose reduced to 100 mg per metered squared secondary to renal insufficiency Cycle 2 CAPOX 10/29/2020 Cycle 3 CAPOX 11/19/2020, capecitabine dose reduced secondary to hand/foot syndrome, oxaliplatin dose reduced secondary to renal failure and poor performance status CT chest/abdomen/pelvis without contrast 12/01/2020-no acute abnormality in the chest, abdomen, or pelvis.  No evidence of obstruction. Resection of a frontal meningioma 09/01/2016 Diabetes Rectal polyp- tubular adenoma on colonoscopy 09/05/2020 Hypertension Diabetic neuropathy Epilepsy Family history of breast and prostate cancer Admission with acute renal failure 10/01/2020-secondary to lack of oral intake and lisinopril, improved with intravenous hydration and holding lisinopril Admission 12/01/2020-sepsis, diarrhea HHS, A. fib with RVR, abdominal pain  Thomas [name] appears unchanged.  I suspect his clinical presentation is related to chemotherapy-induced enteritis in the setting of high output ostomy and his diabetes.  His stool is negative for C. difficile.  He  is now at day 17 following cycle #3 of CAPOX.   Diarrhea is somewhat improved.  Will add tincture of opium today.  The diarrhea is likely secondary to capecitabine induced enteritis.   Recommendations: 1.  Continue Magic mouthwash 2.  Moisturizers, skin care 3.  Continue intravenous hydration 4.  Advance diet, out of bed 5.  Continue Imodium, Lomotil, and will add tincture of opium today. 6.  Hold benzodiazepines    LOS: 4 days   Mikey Bussing, MD 12/05/20 Thomas [name] was interviewed and examined.  He was more alert when I saw him at approximately 7:45 AM.  He continues to have an output from the colostomy.  I will add tincture of opium.  The oral mucositis and skin changes at the hands appear to be improving.  He has an increased creatinine today.  I updated his wife by telephone.  I was present for greater than 50% of today's visit.  I performed medical decision making.

## 2020-12-05 NOTE — Procedures (Signed)
Patient Name: Thomas [name]  MRN: [id number]  Epilepsy Attending: Lora Havens  Referring Physician/Provider: Dr Sander Radon Date: 12/05/2020 Duration: 23.18 mins  Patient history: 65yo M withh/o epilepsy presented with ams. EEG to evaluate for seizure  Level of alertness: Awake  AEDs during EEG study: LEV  Technical aspects: This EEG study was done with scalp electrodes positioned according to the 10-20 International system of electrode placement. Electrical activity was acquired at a sampling rate of 500Hz  and reviewed with a high frequency filter of 70Hz  and a low frequency filter of 1Hz . EEG data were recorded continuously and digitally stored.   Description: No posterior dominant rhythm was seen. EEG showed continuous generalized 3 to 6 Hz theta-delta slowing. There is also 3-5hz  sharply contoured theta-delta slowing with overriding 13-15hz  beta activity in left frontotemporal region consistent with breach artifact.  Hyperventilation and photic stimulation were not performed.     ABNORMALITY - Continuous slow, generalized - Breach artifact, left frontotemporal region  IMPRESSION: This study is suggestive of cortical dysfunction arising from left frontotemporal region likely secondary to underlying encephalomalacia as well as prior craniotomy. There is also moderate diffuse encephalopathy, nonspecific etiology. No seizures or definite epileptiform discharges were seen throughout the recording.   Esbeydi Manago Barbra Sarks

## 2020-12-05 NOTE — Progress Notes (Signed)
  Speech Language Pathology Treatment: Dysphagia  Patient Details Name: Thomas [name] MRN: [id number] DOB: March 15, 1956 Today's Date: 12/05/2020 Time: 1005-1030 SLP Time Calculation (min) (ACUTE ONLY): 25 min  Assessment / Plan / Recommendation Clinical Impression  Today pt seen to assist in determining readiness for po diet administration or MBS. Pt in room with his wife and demonstrating consistent moaning requiring total cues to attend to task.  Wife reports he fell and hit his head prior to being seen at [facility] ER.    Today pt is leaning toward the right significantly and did follow some directions for oral motor exam.  Significant right lingual deviation upon protrusion noted with pt positioned upright and he appears with decreased right labial seal - loss of secretions right.  This is all concerning for potential neuro contribution to dysphagia with hypoglossal and facial nerve involvement.  Made MD aware, who ordered CT of brain.   Pt had been given medicine with applesauce - by RN.  After oral care, SLP provided him with single small ice chips and small boluses of applesauce.  He requires total cues to attempt to masticate ice and moderate cues to orally transit applesauce.  No indication of aspiration noted but pt is at risk.    Continue to recommend only needed medications with applesauce and single small ice chips after oral care when fully alert and accepting. Recommend to have oral suction set up and prepared to use as needed.    Will follow up his pm if pt demonstrates improved mentation and dependent on findings of brain CT.  Educated wife and pt to recommendations using teach back.  Wife expressed appreciation.    HPI HPI: Per Md note, "Thomas [name] was admitted to the hospital with working diagnosis of sepsis syndrome or lactic acidosis complicated with atrial fibrillation with rapid ventricular response"  Pt with PMH + for colon cancer on chemo, Resection of a frontal meningioma  09/01/2016, Transverse colon cancer, stage IIIb (T3N1a), status post a partial transverse colectomy and end colostomy 09/10/2020, DM, recent falls, decreased appetite and poor intake, dehydration, seizures, obesity, sleep apnea.  Swallow evaluation ordered due to concerns for pt aspirating with po medications.  CXR showed Interval development of mild linear atelectasis within the mid left  lung since the prior study 7/28 am.  Pt underwent BSE 12/04/2020 with recommendation for npo x needed meds and single ice chips.  Follow up indicated to assess readiness for advancement or MBS.      SLP Plan  Continue with current plan of care       Recommendations  Diet recommendations: NPO;Other(comment) (x needed medication with applesauce crushed and single small ice chips only) Medication Administration: Crushed with puree                Oral Care Recommendations: Oral care QID Follow up Recommendations: Skilled Nursing facility SLP Visit Diagnosis: Dysphagia, oropharyngeal phase (R13.12);Dysphagia, unspecified (R13.10) Plan: Continue with current plan of care       GO               Thomas Lime, MS [place] Office [phone] Pager [phone]  Thomas [name] 12/05/2020, 10:43 AM

## 2020-12-05 NOTE — Progress Notes (Signed)
SLP Cancellation Note  Patient Details Name: Thomas [name] MRN: [id number] DOB: 1955/06/23   Cancelled treatment:       Reason Eval/Treat Not Completed: Other (comment) (Pt currently lethargic, Will continue efforts, note CT head negative for acute CVA, ? if lingual deviation and decreased labial seal is from prior menigioma.  Will have SLP follow up on 12/06/2020 to help in determining readiness for po and MBS.)  [name], MS [facility] SLP Acute Rehab Services Office [phone] Pager [phone]  [name] 12/05/2020, 4:30 PM

## 2020-12-05 NOTE — Progress Notes (Signed)
PROGRESS NOTE    Thomas [name]  EGB:[id number] DOB: 11-Feb-1956 DOA: 11/30/2020 PCP: Thomas Mile, NP    Brief Narrative:  Thomas [name] was admitted to the hospital with working diagnosis of sepsis syndrome with lactic acidosis complicated with atrial fibrillation with rapid ventricular response. Ruled out for sepsis.   65 year old male who presented with abdominal pain, anorexia and weakness.  Intermittent symptoms for about 2 weeks, more severe over the last 12 hours.  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 He was diagnosed with sepsis syndrome and received broad-spectrum antibiotic therapy, transferred from [place] [facility] to [facility].   Patient received supportive medical therapy including broad-spectrum antibiotic therapy. For his atrial fibrillation he required diltiazem and amiodarone.   Transferred to [facility] 7/27.   Patient continue to be somnolent and confused, no agitation. Poor oral intake and swallow dysfunction.   Sepsis ruled out and discontinue antibiotic therapy.   Patient with progressive confusion and agitation, new delirium.  Poor oral intake and swallow dysfunction.   Head CT with no acute CVA.   Assessment & Plan:   Principal Problem:   Diabetes mellitus with hyperosmolarity without hyperglycemic hyperosmolar nonketotic coma (HCC) Active Problems:   Lactic acidosis   Acute renal failure superimposed on stage 2 chronic kidney disease (HCC)   Tachycardia   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 (HCC)   Adverse effect of chemotherapy   Pressure injury of skin   Atrial fibrillation with RVR (HCC)   Malnutrition of moderate degree   Acute metabolic encephalopathy     Systemic inflammatory response syndrome/ acute metabolic encephalopathy/ delirium.  No leukocytosis, continue  to be afebrile.    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  Patient now NPO due to risk of aspiration. During the night shift agitation and required haloperidol.  Clinical delirium.  Plan to continue with supportive care with IV fluids and aspiration precautions.   Will continue with haldol as needed during the day for agitation and will start with night dose of olanzapine. Add high dose thiamine bid  Neuro checks q 4 h  2. Atrial fibrillation with RVR.  Continue with po diltiazem as tolerated, not able to use long acting since this formulation can not be crushed.    Echocardiogram with preserved LF systolic function. Anticoagulation with apixaban.    3. Colon cancer. Sp colostomy. Continue with supportive medical care. Recent dose of chemotherapy.    4. AKI on CKD stage 2/ non anion gap metabolic acidosis.  Continue to be NPO due to aspiration risk. Worsening renal function with serum cr at 2,6 with K at 5,0 and serum bicarbonate at 17.  Increase fluids to 100 ml per Hr of 0,45 % saline, follow up renal function in am.    5. T2DM hyperglycemia. Glucose this am is 221, will continue close monitoring of capillary glucose.  Will continue with basal insulin at a reduced dose to prevent hypoglycemia.    6. Seizures Change keppra to IV . Will check EEG, patient with prior craniotomy and left frontal lobe encephalomalacia.  High risk for seizures.    7. Obesity class 2/ moderate calorie protein malnutrition. Stage 2 coccyx pressure ulcer (present on admission), BMI is 37. Moderate calorie protein malnutrition.  Continue local skin care.  Limited nutrition due  to impaired cognition and risk of aspiration    Patient continue to be at high risk for worsening encephalopathy and delirium   Status is: Inpatient  Remains inpatient appropriate because:Inpatient level of care appropriate due to severity of  illness  Dispo: The patient is from: Home              Anticipated d/c is to: Home              Patient currently is not medically stable to d/c.   Difficult to place patient No   DVT prophylaxis: Apixaban   Code Status:   full  Family Communication:  I spoke over the phone with the patient's wife about patient's  condition, plan of care, prognosis and all questions were addressed.     Nutrition Status: Nutrition Problem: Moderate Malnutrition Etiology: chronic illness, cancer and cancer related treatments Signs/Symptoms: mild fat depletion, mild muscle depletion, percent weight loss Percent weight loss: 11 % Interventions: Refer to RD note for recommendations     Skin Documentation: Pressure Injury 12/01/20 Coccyx Mid Stage 2 -  Partial thickness loss of dermis presenting as a shallow open injury with a red, pink wound bed without slough. stage 2 decubitus to coxxyx (Active)  12/01/20 0900  Location: Coccyx  Location Orientation: Mid  Staging: Stage 2 -  Partial thickness loss of dermis presenting as a shallow open injury with a red, pink wound bed without slough.  Wound Description (Comments): stage 2 decubitus to coxxyx  Present on Admission: Yes     Consultants:  Oncology    Subjective: Patient continue to be confused and altered, worse than yesterday and not at his baseline, he is NPO due to risk of aspiration.   Objective: Vitals:   12/05/20 0340 12/05/20 0400 12/05/20 0403 12/05/20 0600  BP:  101/61 101/61 (!) 105/55  Pulse:  (!) 107    Resp:  (!) 24 (!) 24 (!) 24  Temp: 99.8 F (37.7 C)     TempSrc: Axillary     SpO2:  94%    Weight:      Height:        Intake/Output Summary (Last 24 hours) at 12/05/2020 0854 Last data filed at 12/05/2020 0600 Gross per 24 hour  Intake 1720.16 ml  Output 1925 ml  Net -204.84 ml   Filed Weights   12/01/20 0825  Weight: 111.8 kg    Examination:   General: Not in pain or dyspnea. Deconditioned  Neurology:  somnolent, positive confusion and intermittent agitation, not following commands.  E ENT: no pallor, no icterus, oral mucosa moist Cardiovascular: No JVD. S1-S2 present, rhythmic, no gallops, rubs, or murmurs. Trace lower extremity edema. Pulmonary: positive breath sounds bilaterally, adequate air movement, no wheezing, rhonchi or rales. Gastrointestinal. Abdomen soft and non tender Skin. No rashes Musculoskeletal: no joint deformities     Data Reviewed: I have personally reviewed following labs and imaging studies  CBC: Recent Labs  Lab 12/01/20 0432 12/02/20 0308 12/03/20 0251 12/04/20 0522 12/05/20 0442  WBC 4.1 4.4 5.2 4.2 4.4  NEUTROABS  --  1.9  --  1.5* 1.8  HGB 11.7* 10.2* 9.2* 9.7* 9.6*  HCT 33.1* 28.6* 26.3* 27.8* 27.6*  MCV 75.2* 74.7* 75.4* 76.6* 77.5*  PLT 212 179 201 253 854   Basic Metabolic Panel: Recent Labs  Lab 12/01/20 0432 12/01/20 1226 12/01/20 1549 12/02/20 0308 12/03/20 0251 12/04/20 0522 12/05/20 0442  NA 132* 136  --  137 139 139 138  K  5.1 4.5  --  4.7 4.4 4.8 5.0  CL 106 110  --  111 112* 112* 111  CO2 15* 19*  --  19* 20* 21* 17*  GLUCOSE 306* 203*  --  250* 217* 218* 221*  BUN 58* 56*  --  56* 48* 43* 55*  CREATININE 2.12* 1.73*  --  1.96* 1.44* 1.49* 2.60*  CALCIUM 8.2* 8.3*  --  8.2* 8.3* 8.0* 7.9*  MG 1.7  --  2.1 1.9 2.3  --   --   PHOS  --   --   --   --  2.7  --   --    GFR: Estimated Creatinine Clearance: 34.8 mL/min (A) (by C-G formula based on SCr of 2.6 mg/dL (H)). Liver Function Tests: Recent Labs  Lab 11/30/20 2205  AST 26  ALT 29  ALKPHOS 67  BILITOT 1.4*  PROT 6.8  ALBUMIN 3.3*   Recent Labs  Lab 12/01/20 0018  LIPASE 24   No results for input(s): AMMONIA in the last 168 hours. Coagulation Profile: Recent Labs  Lab 11/30/20 2205  INR 1.5*   Cardiac Enzymes: No results for input(s): CKTOTAL, CKMB, CKMBINDEX, TROPONINI in the last 168 hours. BNP (last 3 results) No results for input(s): PROBNP in  the last 8760 hours. HbA1C: No results for input(s): HGBA1C in the last 72 hours. CBG: Recent Labs  Lab 12/04/20 1528 12/04/20 1940 12/04/20 2330 12/05/20 0338 12/05/20 0756  GLUCAP 197* 225* 200* 225* 201*   Lipid Profile: No results for input(s): CHOL, HDL, LDLCALC, TRIG, CHOLHDL, LDLDIRECT in the last 72 hours. Thyroid Function Tests: No results for input(s): TSH, T4TOTAL, FREET4, T3FREE, THYROIDAB in the last 72 hours. Anemia Panel: No results for input(s): VITAMINB12, FOLATE, FERRITIN, TIBC, IRON, RETICCTPCT in the last 72 hours.    Radiology Studies: I have reviewed all of the imaging during this hospital visit personally     Scheduled Meds:  apixaban  5 mg Oral BID   chlorhexidine  15 mL Mouth Rinse BID   Chlorhexidine Gluconate Cloth  6 each Topical Daily   diltiazem  30 mg Oral Q6H   diphenoxylate-atropine  10 mL Oral QID   gabapentin  300 mg Oral TID   hydrocerin   Topical BID   insulin aspart  0-15 Units Subcutaneous Q4H   insulin glargine-yfgn  10 Units Subcutaneous Daily   loperamide HCl  2 mg Oral TID   mouth rinse  15 mL Mouth Rinse q12n4p   Continuous Infusions:  sodium chloride 75 mL/hr at 12/05/20 0400   sodium chloride Stopped (12/04/20 1119)   sodium chloride Stopped (12/02/20 0308)   chlorproMAZINE (THORAZINE) IV Stopped (12/03/20 0544)   levETIRAcetam Stopped (12/04/20 2200)   promethazine (PHENERGAN) injection (IM or IVPB) Stopped (12/01/20 2020)     LOS: 4 days        Xochilth Standish Gerome Apley, MD

## 2020-12-05 NOTE — Progress Notes (Signed)
EEG completed, results pending. 

## 2020-12-05 NOTE — Progress Notes (Signed)
Cross-coverage note:   Patient became agitated this am, unable to be redirected. Plan for a dose of Haldol, delirium precautions.

## 2020-12-05 NOTE — Progress Notes (Signed)
    CC: Abdominal pain  Subjective:  No complaints of abdominal pain.  Still having high ostomy output with liquid stool in appliance at bedside.  Objective: Vital signs in last 24 hours: Temp:  [97.9 F (36.6 C)-99.8 F (37.7 C)] 99.8 F (37.7 C) (07/29 0340) Pulse Rate:  [101-109] 107 (07/29 0400) Resp:  [16-39] 24 (07/29 0600) BP: (94-146)/(54-100) 105/55 (07/29 0600) SpO2:  [90 %-99 %] 94 % (07/29 0400) Last BM Date: 12/04/20    Intake/Output from previous day: 07/28 0701 - 07/29 0700 In: 1720.2 [I.V.:1522.8; IV Piggyback:197.4] Out: 2050 [Urine:675; Stool:1375] Intake/Output this shift: No intake/output data recorded.  General appearance: Sleepy this AM.  Not complaining at all. Resp: Clear anterior GI: Obese, liquid stool in ostomy appliance.  Lab Results:  Recent Labs    12/04/20 0522 12/05/20 0442  WBC 4.2 4.4  HGB 9.7* 9.6*  HCT 27.8* 27.6*  PLT 253 264     BMET Recent Labs    12/04/20 0522 12/05/20 0442  NA 139 138  K 4.8 5.0  CL 112* 111  CO2 21* 17*  GLUCOSE 218* 221*  BUN 43* 55*  CREATININE 1.49* 2.60*  CALCIUM 8.0* 7.9*    PT/INR No results for input(s): LABPROT, INR in the last 72 hours.  Recent Labs  Lab 11/30/20 2205  AST 26  ALT 29  ALKPHOS 67  BILITOT 1.4*  PROT 6.8  ALBUMIN 3.3*      Lipase     Component Value Date/Time   LIPASE 24 12/01/2020 0018     Medications:  apixaban  5 mg Oral BID   chlorhexidine  15 mL Mouth Rinse BID   Chlorhexidine Gluconate Cloth  6 each Topical Daily   diltiazem  30 mg Oral Q6H   diphenoxylate-atropine  10 mL Oral QID   hydrocerin   Topical BID   insulin aspart  0-15 Units Subcutaneous Q4H   insulin glargine-yfgn  10 Units Subcutaneous Daily   loperamide HCl  2 mg Oral TID   mouth rinse  15 mL Mouth Rinse q12n4p   Opium  0.6 mL Oral Q6H    Assessment/Plan Thomas [name] is a 65 year old male undergoing chemotherapy for stage IIIb colon cancer after a colectomy with end  transverse colostomy earlier this year.  He presented dehydrated with high ostomy output.  We have been titrating up the antimotility agents throughout the week this week.  - Lomotil 10 mg 4 times daily today  - Imodium 2 mg 3 times daily - 0.35mL tincture of opium four times daily  Remainder of care per primary team     LOS: 4 days    Felicie Morn 12/05/2020 Please see Amion

## 2020-12-05 NOTE — Progress Notes (Addendum)
MD made aware of patients increasing agitation & confusion over night. Pt yelling "help me" "get me out of here" "I need water." Pt denies any pain. This RN explained and educated the patient that he was in the hospital & that he is unable to have water at this moment d/t swallowing issues. This RN swabbed his mouth out. Pt still confused/agitated. Unable to reorient. MD ordered 1x dose of haldol. Pt now resting comfortably. This RN will continue to monitor

## 2020-12-06 ENCOUNTER — Inpatient Hospital Stay (HOSPITAL_COMMUNITY): Payer: Medicare HMO

## 2020-12-06 DIAGNOSIS — E872 Acidosis: Secondary | ICD-10-CM | POA: Diagnosis not present

## 2020-12-06 DIAGNOSIS — I4891 Unspecified atrial fibrillation: Secondary | ICD-10-CM | POA: Diagnosis not present

## 2020-12-06 DIAGNOSIS — E1100 Type 2 diabetes mellitus with hyperosmolarity without nonketotic hyperglycemic-hyperosmolar coma (NKHHC): Secondary | ICD-10-CM | POA: Diagnosis not present

## 2020-12-06 DIAGNOSIS — N179 Acute kidney failure, unspecified: Secondary | ICD-10-CM | POA: Diagnosis not present

## 2020-12-06 DIAGNOSIS — J9601 Acute respiratory failure with hypoxia: Secondary | ICD-10-CM | POA: Diagnosis not present

## 2020-12-06 LAB — MAGNESIUM: Magnesium: 2.4 mg/dL (ref 1.7–2.4)

## 2020-12-06 LAB — CULTURE, BLOOD (ROUTINE X 2)
Culture: NO GROWTH
Culture: NO GROWTH
Special Requests: ADEQUATE

## 2020-12-06 LAB — BASIC METABOLIC PANEL
Anion gap: 11 (ref 5–15)
Anion gap: 15 (ref 5–15)
BUN: 75 mg/dL — ABNORMAL HIGH (ref 8–23)
BUN: 88 mg/dL — ABNORMAL HIGH (ref 8–23)
CO2: 16 mmol/L — ABNORMAL LOW (ref 22–32)
CO2: 14 mmol/L — ABNORMAL LOW (ref 22–32)
Calcium: 7.7 mg/dL — ABNORMAL LOW (ref 8.9–10.3)
Calcium: 8 mg/dL — ABNORMAL LOW (ref 8.9–10.3)
Chloride: 110 mmol/L (ref 98–111)
Chloride: 109 mmol/L (ref 98–111)
Creatinine, Ser: 3.78 mg/dL — ABNORMAL HIGH (ref 0.61–1.24)
Creatinine, Ser: 4.69 mg/dL — ABNORMAL HIGH (ref 0.61–1.24)
GFR, Estimated: 17 mL/min — ABNORMAL LOW (ref >60)
GFR, Estimated: 13 mL/min — ABNORMAL LOW (ref >60)
Glucose, Bld: 179 mg/dL — ABNORMAL HIGH (ref 70–99)
Glucose, Bld: 170 mg/dL — ABNORMAL HIGH (ref 70–99)
Potassium: 5.4 mmol/L — ABNORMAL HIGH (ref 3.5–5.1)
Potassium: 5.4 mmol/L — ABNORMAL HIGH (ref 3.5–5.1)
Sodium: 137 mmol/L (ref 135–145)
Sodium: 138 mmol/L (ref 135–145)

## 2020-12-06 LAB — BLOOD GAS, ARTERIAL
Acid-base deficit: 14 mmol/L — ABNORMAL HIGH (ref 0.0–2.0)
Bicarbonate: 13.1 mmol/L — ABNORMAL LOW (ref 20.0–28.0)
FIO2: 100
O2 Saturation: 96.7 %
Patient temperature: 98.9
pCO2 arterial: 37.4 mmHg (ref 32.0–48.0)
pH, Arterial: 7.173 — CL (ref 7.350–7.450)
pO2, Arterial: 126 mmHg — ABNORMAL HIGH (ref 83.0–108.0)

## 2020-12-06 LAB — GLUCOSE, CAPILLARY
Glucose-Capillary: 162 mg/dL — ABNORMAL HIGH (ref 70–99)
Glucose-Capillary: 144 mg/dL — ABNORMAL HIGH (ref 70–99)
Glucose-Capillary: 139 mg/dL — ABNORMAL HIGH (ref 70–99)
Glucose-Capillary: 136 mg/dL — ABNORMAL HIGH (ref 70–99)
Glucose-Capillary: 157 mg/dL — ABNORMAL HIGH (ref 70–99)

## 2020-12-06 LAB — CBC WITH DIFFERENTIAL/PLATELET
Abs Immature Granulocytes: 1.1 10*3/uL — ABNORMAL HIGH (ref 0.00–0.07)
Abs Immature Granulocytes: 0.5 10*3/uL — ABNORMAL HIGH (ref 0.00–0.07)
Band Neutrophils: 13 %
Basophils Absolute: 0 10*3/uL (ref 0.0–0.1)
Basophils Absolute: 0.1 10*3/uL (ref 0.0–0.1)
Basophils Relative: 1 %
Basophils Relative: 1 %
Eosinophils Absolute: 0 10*3/uL (ref 0.0–0.5)
Eosinophils Absolute: 0.1 10*3/uL (ref 0.0–0.5)
Eosinophils Relative: 1 %
Eosinophils Relative: 2 %
HCT: 25.2 % — ABNORMAL LOW (ref 39.0–52.0)
HCT: 25.6 % — ABNORMAL LOW (ref 39.0–52.0)
Hemoglobin: 8.6 g/dL — ABNORMAL LOW (ref 13.0–17.0)
Hemoglobin: 8.8 g/dL — ABNORMAL LOW (ref 13.0–17.0)
Immature Granulocytes: 8 %
Lymphocytes Relative: 27 %
Lymphocytes Relative: 15 %
Lymphs Abs: 1.3 10*3/uL (ref 0.7–4.0)
Lymphs Abs: 0.9 10*3/uL (ref 0.7–4.0)
MCH: 26.9 pg (ref 26.0–34.0)
MCH: 26.4 pg (ref 26.0–34.0)
MCHC: 34.1 g/dL (ref 30.0–36.0)
MCHC: 34.4 g/dL (ref 30.0–36.0)
MCV: 78.8 fL — ABNORMAL LOW (ref 80.0–100.0)
MCV: 76.9 fL — ABNORMAL LOW (ref 80.0–100.0)
Metamyelocytes Relative: 11 %
Monocytes Absolute: 0.2 10*3/uL (ref 0.1–1.0)
Monocytes Absolute: 0.7 10*3/uL (ref 0.1–1.0)
Monocytes Relative: 4 %
Monocytes Relative: 11 %
Myelocytes: 13 %
Neutro Abs: 2 10*3/uL (ref 1.7–7.7)
Neutro Abs: 3.9 10*3/uL (ref 1.7–7.7)
Neutrophils Relative %: 30 %
Neutrophils Relative %: 63 %
Platelets: 245 10*3/uL (ref 150–400)
Platelets: 295 10*3/uL (ref 150–400)
RBC: 3.2 MIL/uL — ABNORMAL LOW (ref 4.22–5.81)
RBC: 3.33 MIL/uL — ABNORMAL LOW (ref 4.22–5.81)
RDW: 22.8 % — ABNORMAL HIGH (ref 11.5–15.5)
RDW: 22.8 % — ABNORMAL HIGH (ref 11.5–15.5)
WBC: 4.7 10*3/uL (ref 4.0–10.5)
WBC: 6.1 10*3/uL (ref 4.0–10.5)
nRBC: 21 % — ABNORMAL HIGH (ref 0.0–0.2)
nRBC: 16.4 % — ABNORMAL HIGH (ref 0.0–0.2)

## 2020-12-06 LAB — BLOOD GAS, VENOUS
Acid-base deficit: 12.7 mmol/L — ABNORMAL HIGH (ref 0.0–2.0)
Bicarbonate: 13.5 mmol/L — ABNORMAL LOW (ref 20.0–28.0)
O2 Saturation: 34.2 %
Patient temperature: 98.6
pCO2, Ven: 33.6 mmHg — ABNORMAL LOW (ref 44.0–60.0)
pH, Ven: 7.229 — ABNORMAL LOW (ref 7.250–7.430)
pO2, Ven: <32 mmHg — CL (ref 32.0–45.0)

## 2020-12-06 LAB — TROPONIN I (HIGH SENSITIVITY): Troponin I (High Sensitivity): 25 ng/L — ABNORMAL HIGH (ref <18)

## 2020-12-06 LAB — CREATININE, URINE, RANDOM: Creatinine, Urine: 65.03 mg/dL

## 2020-12-06 LAB — PROCALCITONIN: Procalcitonin: 32.28 ng/mL

## 2020-12-06 LAB — LACTIC ACID, PLASMA: Lactic Acid, Venous: 3.7 mmol/L (ref 0.5–1.9)

## 2020-12-06 LAB — BRAIN NATRIURETIC PEPTIDE: B Natriuretic Peptide: 74.9 pg/mL (ref 0.0–100.0)

## 2020-12-06 LAB — SODIUM, URINE, RANDOM: Sodium, Ur: 82 mmol/L

## 2020-12-06 MED ORDER — ETOMIDATE 2 MG/ML IV SOLN
INTRAVENOUS | Status: AC
  Administered 2020-12-06: 20 mg via INTRAVENOUS
  Filled 2020-12-06: qty 20

## 2020-12-06 MED ORDER — AMIODARONE HCL IN DEXTROSE 360-4.14 MG/200ML-% IV SOLN
30.0000 mg/h | INTRAVENOUS | Status: DC
Start: 2020-12-07 — End: 2020-12-11
  Administered 2020-12-07 – 2020-12-11 (×10): 30 mg/h via INTRAVENOUS
  Filled 2020-12-06 (×10): qty 200

## 2020-12-06 MED ORDER — FENTANYL CITRATE (PF) 100 MCG/2ML IJ SOLN
INTRAMUSCULAR | Status: AC
  Administered 2020-12-06: 25 ug via INTRAVENOUS
  Filled 2020-12-06: qty 2

## 2020-12-06 MED ORDER — METOPROLOL TARTRATE 5 MG/5ML IV SOLN
2.5000 mg | Freq: Once | INTRAVENOUS | Status: AC
  Administered 2020-12-06: 2.5 mg via INTRAVENOUS
  Filled 2020-12-06: qty 5

## 2020-12-06 MED ORDER — SODIUM CHLORIDE 0.9 % IV SOLN
3.0000 g | Freq: Two times a day (BID) | INTRAVENOUS | Status: DC
  Administered 2020-12-06 – 2020-12-07 (×2): 3 g via INTRAVENOUS
  Filled 2020-12-06: qty 8
  Filled 2020-12-06: qty 3
  Filled 2020-12-06: qty 8

## 2020-12-06 MED ORDER — INSULIN ASPART 100 UNIT/ML IJ SOLN: 0.0000 [IU] | Freq: Three times a day (TID) | INTRAMUSCULAR | Status: DC

## 2020-12-06 MED ORDER — INSULIN ASPART 100 UNIT/ML IJ SOLN
0.0000 [IU] | Freq: Every day | INTRAMUSCULAR | Status: DC
Start: 2020-12-07 — End: 2020-12-07
  Administered 2020-12-07: 2 [IU] via SUBCUTANEOUS

## 2020-12-06 MED ORDER — FENTANYL CITRATE (PF) 100 MCG/2ML IJ SOLN
25.0000 ug | Freq: Once | INTRAMUSCULAR | Status: AC
Start: 2020-12-06 — End: 2020-12-06

## 2020-12-06 MED ORDER — FAMOTIDINE IN NACL 20-0.9 MG/50ML-% IV SOLN
20.0000 mg | INTRAVENOUS | Status: DC
  Administered 2020-12-07 – 2020-12-12 (×7): 20 mg via INTRAVENOUS
  Filled 2020-12-06 (×7): qty 50

## 2020-12-06 MED ORDER — POLYETHYLENE GLYCOL 3350 17 G PO PACK: 17.0000 g | PACK | Freq: Every day | ORAL | Status: DC | PRN

## 2020-12-06 MED ORDER — PHENYLEPHRINE 40 MCG/ML (10ML) SYRINGE FOR IV PUSH (FOR BLOOD PRESSURE SUPPORT)
PREFILLED_SYRINGE | INTRAVENOUS | Status: AC
  Filled 2020-12-06: qty 10

## 2020-12-06 MED ORDER — HEPARIN SODIUM (PORCINE) 5000 UNIT/ML IJ SOLN
5000.0000 [IU] | Freq: Three times a day (TID) | INTRAMUSCULAR | Status: DC
  Administered 2020-12-07 – 2020-12-08 (×3): 5000 [IU] via SUBCUTANEOUS
  Filled 2020-12-06 (×3): qty 1

## 2020-12-06 MED ORDER — ETOMIDATE 2 MG/ML IV SOLN: 20.0000 mg | Freq: Once | INTRAVENOUS | Status: AC

## 2020-12-06 MED ORDER — ROCURONIUM BROMIDE 50 MG/5ML IV SOLN
75.0000 mg | Freq: Once | INTRAVENOUS | Status: AC
  Filled 2020-12-06: qty 7.5

## 2020-12-06 MED ORDER — PROPOFOL 1000 MG/100ML IV EMUL
0.0000 ug/kg/min | INTRAVENOUS | Status: DC
  Administered 2020-12-07: 5 ug/kg/min via INTRAVENOUS
  Administered 2020-12-07: 15 ug/kg/min via INTRAVENOUS
  Filled 2020-12-06 (×2): qty 100

## 2020-12-06 MED ORDER — CALCIUM POLYCARBOPHIL 625 MG PO TABS
625.0000 mg | ORAL_TABLET | Freq: Three times a day (TID) | ORAL | Status: DC
  Filled 2020-12-06 (×4): qty 1

## 2020-12-06 MED ORDER — FENTANYL CITRATE (PF) 100 MCG/2ML IJ SOLN
12.5000 ug | Freq: Once | INTRAMUSCULAR | Status: AC
  Administered 2020-12-06: 12.5 ug via INTRAVENOUS

## 2020-12-06 MED ORDER — AMIODARONE HCL IN DEXTROSE 360-4.14 MG/200ML-% IV SOLN: 60.0000 mg/h | INTRAVENOUS | Status: DC

## 2020-12-06 MED ORDER — AMIODARONE IV BOLUS ONLY 150 MG/100ML
INTRAVENOUS | Status: AC
  Administered 2020-12-06: 150 mg via INTRAVENOUS
  Filled 2020-12-06: qty 100

## 2020-12-06 MED ORDER — ROCURONIUM BROMIDE 10 MG/ML (PF) SYRINGE
PREFILLED_SYRINGE | INTRAVENOUS | Status: AC
  Administered 2020-12-06: 75 mg via INTRAVENOUS
  Filled 2020-12-06: qty 10

## 2020-12-06 MED ORDER — VITAL HIGH PROTEIN PO LIQD: 1000.0000 mL | ORAL | Status: DC

## 2020-12-06 MED ORDER — FENTANYL CITRATE (PF) 100 MCG/2ML IJ SOLN
50.0000 ug | INTRAMUSCULAR | Status: DC | PRN
  Administered 2020-12-07 (×2): 100 ug via INTRAVENOUS
  Filled 2020-12-06 (×3): qty 2

## 2020-12-06 MED ORDER — FENTANYL CITRATE (PF) 100 MCG/2ML IJ SOLN
50.0000 ug | INTRAMUSCULAR | Status: DC | PRN
  Administered 2020-12-07 (×2): 50 ug via INTRAVENOUS
  Filled 2020-12-06: qty 2

## 2020-12-06 MED ORDER — PROSOURCE TF PO LIQD: 45.0000 mL | Freq: Two times a day (BID) | ORAL | Status: DC

## 2020-12-06 MED ORDER — DILTIAZEM HCL-DEXTROSE 125-5 MG/125ML-% IV SOLN (PREMIX)
5.0000 mg/h | INTRAVENOUS | Status: DC
  Administered 2020-12-06: 5 mg/h via INTRAVENOUS
  Filled 2020-12-06: qty 125

## 2020-12-06 MED ORDER — MIDAZOLAM HCL 2 MG/2ML IJ SOLN
INTRAMUSCULAR | Status: AC
  Filled 2020-12-06: qty 2

## 2020-12-06 MED ORDER — FUROSEMIDE 10 MG/ML IJ SOLN
80.0000 mg | Freq: Once | INTRAMUSCULAR | Status: AC
  Administered 2020-12-06: 80 mg via INTRAVENOUS
  Filled 2020-12-06: qty 8

## 2020-12-06 MED ORDER — FENTANYL CITRATE (PF) 100 MCG/2ML IJ SOLN
25.0000 ug | Freq: Once | INTRAMUSCULAR | Status: AC
Start: 2020-12-06 — End: 2020-12-06
  Administered 2020-12-06: 25 ug via INTRAVENOUS
  Filled 2020-12-06: qty 2

## 2020-12-06 MED ORDER — AMIODARONE HCL IN DEXTROSE 360-4.14 MG/200ML-% IV SOLN
INTRAVENOUS | Status: AC
  Administered 2020-12-06: 60 mg/h via INTRAVENOUS
  Filled 2020-12-06: qty 200

## 2020-12-06 MED ORDER — AMIODARONE IV BOLUS ONLY 150 MG/100ML: 150.0000 mg | Freq: Once | INTRAVENOUS | Status: AC

## 2020-12-06 MED ORDER — DOCUSATE SODIUM 50 MG/5ML PO LIQD: 100.0000 mg | Freq: Two times a day (BID) | ORAL | Status: DC | PRN

## 2020-12-06 NOTE — Progress Notes (Addendum)
PROGRESS NOTE    Thomas [name]  SEG:[id number] DOB: [birth year] DOA: 11/30/2020 PCP: [name], NP    Brief Narrative:  [name] was admitted to the hospital with working diagnosis of sepsis syndrome with lactic acidosis complicated with atrial fibrillation with rapid ventricular response. Patient has developed severe delirium, and multiorgan failure with aspiration pneumonia.    65 year old male who presented with abdominal pain, anorexia and weakness.  Intermittent symptoms for about 2 weeks, more severe over the last 12 hours.  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 He was diagnosed with sepsis syndrome and received broad-spectrum antibiotic therapy, transferred from [place] ED to [facility].   Patient received supportive medical therapy including broad-spectrum antibiotic therapy. For his atrial fibrillation he required diltiazem and amiodarone.   Transferred to [facility] 7/27.   Patient continue to be somnolent and confused, initially with no agitation but poor oral intake and swallow dysfunction.   Sepsis ruled out and discontinue antibiotic therapy.    Patient with progressive confusion and agitation, new delirium. Poor oral intake and swallow dysfunction.    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 Patient now NPO due to risk of aspiration.  Patient has developed severe encephalopathy and worsening renal failure, positive hypoxemia, and multiorgan failure. I spoke with his wife about advance directives, and she will contact the rest of the family.  New aspiration pneumonia, resumed antibiotic therapy on 07/30.   Assessment & Plan:   Principal Problem:   Diabetes mellitus with hyperosmolarity without hyperglycemic hyperosmolar  nonketotic coma (HCC) Active Problems:   Lactic acidosis   Acute renal failure superimposed on stage 2 chronic kidney disease (HCC)   Tachycardia   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 (HCC)   Adverse effect of chemotherapy   Pressure injury of skin   Atrial fibrillation with RVR (HCC)   Malnutrition of moderate degree   Acute metabolic encephalopathy   Systemic inflammatory response syndrome/ acute metabolic encephalopathy/ delirium. Acute hypoxemic respiratory failure. NEW aspiration pneumonia, right upper lobe.   Worsening encephalopathy, thick secretions in his oropharynx. Positive respiratory distress  Chest radiograph this am with bibasilar atelectasis, and right upper lobe interstitial infiltrate.     Plan to resume antibiotic therapy with Unasyn Furosemide 80 mg IV x1  Place patient on non invasive mechanical ventilation for respiratory distress.  Follow with advance directives from his family. Continue NPO.   Continue to have severe delirium despite haloperidol and olanzapine.  Head CT negative for CVA, but can not rule out in the setting of persistent neurologic deficit. Patient not stable for brain MRI.    2. Atrial fibrillation with RVR.  Echocardiogram with preserved LF systolic function.  Not able to take oral diltiazem, will transition back to diltiazem drip for rate control. Hold on apixaban for now.    3. Colon cancer. Sp colostomy/ diarrhea. Recent dose of chemotherapy.  Stool output 1,700 ml.  I spoke with Dr Thomas [name], patient with not known metastasis.  Adjuvant chemotherapy.    4. AKI on CKD stage 2/ hyperkalemia, non anion gap metabolic acidosis. Suspected ATN.  Positive hypotension with systolic down to 60 early this morning.  Patient with worsening renal function.  Serum cr is up to 3,78 with K at 5,3 and serum bicarbonate at 16, BUN 75.  Urine output 250 ml over last 24 hrs. Positive signs of volume overload.   Plan for  80 mg of IV furosemide x1 Follow up renal function and electrolytes.  Avoid hypotension or nephrotoxic agents.  Considering his severe delirium, likely not candidate for renal replacement therapy, will consult nephrology.     5. T2DM hyperglycemia. Glucose 179 this am, patient has been NPO. Will continue insulin sliding scale for glucose cover and monitoring.  Discontinue basal insulin.    6. Seizures Change keppra to IV . EEG with encephalopathy but no active seizures, continue with IV keppra.    7. Obesity class 2/ moderate calorie protein malnutrition. Stage 2 coccyx pressure ulcer (present on admission), BMI is 37. Moderate calorie protein malnutrition.  Continue local skin care. Patient is NPO due to risk of aspiration   Patient continue to be at high risk for worsening multiorgan failure. Patient critically ill, critical care time 60 minutes.   Status is: Inpatient  Remains inpatient appropriate because:IV treatments appropriate due to intensity of illness or inability to take PO  Dispo: The patient is from: Home              Anticipated d/c is to: Home              Patient currently is not medically stable to d/c.   Difficult to place patient No   DVT prophylaxis: Heparin   Code Status:   full  Family Communication:   No family at the bedside      Nutrition Status: Nutrition Problem: Moderate Malnutrition Etiology: chronic illness, cancer and cancer related treatments Signs/Symptoms: mild fat depletion, mild muscle depletion, percent weight loss Percent weight loss: 11 % Interventions: Refer to RD note for recommendations     Skin Documentation: Pressure Injury 12/01/20 Coccyx Mid Stage 2 -  Partial thickness loss of dermis presenting as a shallow open injury with a red, pink wound bed without slough. stage 2 decubitus to coxxyx (Active)  12/01/20 0900  Location: Coccyx  Location Orientation: Mid  Staging: Stage 2 -  Partial thickness loss of dermis presenting  as a shallow open injury with a red, pink wound bed without slough.  Wound Description (Comments): stage 2 decubitus to coxxyx  Present on Admission: Yes     Consultants:  Oncology    Subjective: Patient poorly responsive, not following commands or opening his eyes, not able to sleep last night. Had haloperidol this am.   Objective: Vitals:   12/06/20 0607 12/06/20 0700 12/06/20 0826 12/06/20 0845  BP: (!) 121/58 (!) 104/51    Pulse: (!) 102   (!) 101  Resp: (!) 24 19  (!) 28  Temp: 98.8 F (37.1 C)  98.9 F (37.2 C)   TempSrc: Axillary     SpO2: 96%   96%  Weight:      Height:        Intake/Output Summary (Last 24 hours) at 12/06/2020 0849 Last data filed at 12/06/2020 0658 Gross per 24 hour  Intake 2185.31 ml  Output 1905 ml  Net 280.31 ml   Filed Weights   12/01/20 0825  Weight: 111.8 kg    Examination:   General: Not in pain or dyspnea  Neurology: somnolent, not following commands or answering to simple questions.  E ENT: mild pallor, no icterus, oral mucosa moist, positive accessory muscle use.  Cardiovascular: No JVD. S1-S2 present, rhythmic, no gallops, rubs, or murmurs. No lower extremity edema. Pulmonary: positive breath sounds bilaterally, with no wheezing,  positive bilateral rhonchi and rales. Gastrointestinal. Abdomen soft and non tender, distended Skin. No rashes Musculoskeletal: no joint deformities     Data Reviewed: I have personally reviewed following labs and imaging studies  CBC: Recent Labs  Lab 12/02/20 0308 12/03/20 0251 12/04/20 0522 12/05/20 0442 12/06/20 0437  WBC 4.4 5.2 4.2 4.4 4.7  NEUTROABS 1.9  --  1.5* 1.8 2.0  HGB 10.2* 9.2* 9.7* 9.6* 8.6*  HCT 28.6* 26.3* 27.8* 27.6* 25.2*  MCV 74.7* 75.4* 76.6* 77.5* 78.8*  PLT 179 201 253 264 347   Basic Metabolic Panel: Recent Labs  Lab 12/01/20 0432 12/01/20 1226 12/01/20 1549 12/02/20 0308 12/03/20 0251 12/04/20 0522 12/05/20 0442 12/06/20 0437  NA 132*   < >  --   137 139 139 138 137  K 5.1   < >  --  4.7 4.4 4.8 5.0 5.4*  CL 106   < >  --  111 112* 112* 111 110  CO2 15*   < >  --  19* 20* 21* 17* 16*  GLUCOSE 306*   < >  --  250* 217* 218* 221* 179*  BUN 58*   < >  --  56* 48* 43* 55* 75*  CREATININE 2.12*   < >  --  1.96* 1.44* 1.49* 2.60* 3.78*  CALCIUM 8.2*   < >  --  8.2* 8.3* 8.0* 7.9* 7.7*  MG 1.7  --  2.1 1.9 2.3  --   --   --   PHOS  --   --   --   --  2.7  --   --   --    < > = values in this interval not displayed.   GFR: Estimated Creatinine Clearance: 24 mL/min (A) (by C-G formula based on SCr of 3.78 mg/dL (H)). Liver Function Tests: Recent Labs  Lab 11/30/20 2205  AST 26  ALT 29  ALKPHOS 67  BILITOT 1.4*  PROT 6.8  ALBUMIN 3.3*   Recent Labs  Lab 12/01/20 0018  LIPASE 24   No results for input(s): AMMONIA in the last 168 hours. Coagulation Profile: Recent Labs  Lab 11/30/20 2205  INR 1.5*   Cardiac Enzymes: No results for input(s): CKTOTAL, CKMB, CKMBINDEX, TROPONINI in the last 168 hours. BNP (last 3 results) No results for input(s): PROBNP in the last 8760 hours. HbA1C: No results for input(s): HGBA1C in the last 72 hours. CBG: Recent Labs  Lab 12/05/20 1556 12/05/20 1953 12/05/20 2337 12/06/20 0429 12/06/20 0814  GLUCAP 174* 161* 156* 162* 144*   Lipid Profile: No results for input(s): CHOL, HDL, LDLCALC, TRIG, CHOLHDL, LDLDIRECT in the last 72 hours. Thyroid Function Tests: No results for input(s): TSH, T4TOTAL, FREET4, T3FREE, THYROIDAB in the last 72 hours. Anemia Panel: No results for input(s): VITAMINB12, FOLATE, FERRITIN, TIBC, IRON, RETICCTPCT in the last 72 hours.    Radiology Studies: I have reviewed all of the imaging during this hospital visit personally     Scheduled Meds:  chlorhexidine  15 mL Mouth Rinse BID   Chlorhexidine Gluconate Cloth  6 each Topical Daily   diphenoxylate-atropine  10 mL Oral QID   furosemide  80 mg Intravenous Once   hydrocerin   Topical BID    insulin aspart  0-15 Units Subcutaneous Q4H   insulin glargine-yfgn  5 Units Subcutaneous Daily   loperamide HCl  2 mg Oral TID   mouth rinse  15 mL Mouth Rinse q12n4p   OLANZapine  5 mg Intramuscular QHS  Opium  0.6 mL Oral Q6H   polycarbophil  625 mg Oral TID   Continuous Infusions:  sodium chloride Stopped (12/04/20 1119)   sodium chloride Stopped (12/02/20 0308)   ampicillin-sulbactam (UNASYN) IV     chlorproMAZINE (THORAZINE) IV Stopped (12/03/20 0544)   diltiazem (CARDIZEM) infusion 5 mg/hr (12/06/20 0806)   levETIRAcetam Stopped (12/05/20 2209)   promethazine (PHENERGAN) injection (IM or IVPB)     thiamine injection Stopped (12/05/20 1748)     LOS: 5 days        Tavaras Goody Gerome Apley, MD

## 2020-12-06 NOTE — Progress Notes (Signed)
Called [facility] about the critical values of ABG.

## 2020-12-06 NOTE — Progress Notes (Signed)
Subjective: The patient is seen and examined this morning.  He is a little bit more confused this morning.  He is currently on BiPAP for worsening shortness of breath.  He continues to have few episodes of diarrhea but no significant fever or chills overnight.  He was a little bit agitated and combative last night  Objective: Vital signs in last 24 hours: Temp:  [98.1 F (36.7 C)-99.8 F (37.7 C)] 98.9 F (37.2 C) (07/30 0826) Pulse Rate:  [96-134] 101 (07/30 0845) Resp:  [19-37] 28 (07/30 0845) BP: (85-132)/(43-113) 104/51 (07/30 0700) SpO2:  [86 %-100 %] 96 % (07/30 0845)  Intake/Output from previous day: 07/29 0701 - 07/30 0700 In: 2185.3 [I.V.:2085.3; IV Piggyback:100] Out: 1925 [Urine:225; Stool:1700] Intake/Output this shift: No intake/output data recorded.  General appearance: distracted, fatigued, and moderately obese Resp: clear to auscultation bilaterally Cardio: regular rate and rhythm, S1, S2 normal, no murmur, click, rub or gallop GI: soft, non-tender; bowel sounds normal; no masses,  no organomegaly Extremities: extremities normal, atraumatic, no cyanosis or edema  Lab Results:  Recent Labs    12/05/20 0442 12/06/20 0437  WBC 4.4 4.7  HGB 9.6* 8.6*  HCT 27.6* 25.2*  PLT 264 245   BMET Recent Labs    12/05/20 0442 12/06/20 0437  NA 138 137  K 5.0 5.4*  CL 111 110  CO2 17* 16*  GLUCOSE 221* 179*  BUN 55* 75*  CREATININE 2.60* 3.78*  CALCIUM 7.9* 7.7*    Studies/Results: CT HEAD WO CONTRAST  Result Date: 12/05/2020 CLINICAL DATA:  Acute neuro deficit, agitation and delirium, history of colon carcinoma EXAM: CT HEAD WITHOUT CONTRAST TECHNIQUE: Contiguous axial images were obtained from the base of the skull through the vertex without intravenous contrast. COMPARISON:  09/08/2016 and previous FINDINGS: Brain: Previous left frontal craniotomy. Stable encephalomalacia in left frontal lobe. Negative for acute intracranial hemorrhage, midline shift, no  parenchymal edema, mass or mass effect. No hydrocephalus. Mild diffuse parenchymal atrophy as before. Vascular: Atherosclerotic and physiologic intracranial calcifications. Skull: Changes of left temporal craniotomy. No acute fracture or worrisome bone lesion. Sinuses/Orbits: No acute finding. Other: None IMPRESSION: 1. Negative for bleed or other acute intracranial process. 2. Stable changes of left frontal craniotomy Electronically Signed   By: Lucrezia Europe M.D.   On: 12/05/2020 11:34   EEG adult  Result Date: 12/05/2020 Lora Havens, MD     12/05/2020  6:19 PM Patient Name: DONTAVIUS KEIM MRN: [id number] Epilepsy Attending: Lora Havens Referring Physician/Provider: Dr Sander Radon Date: 12/05/2020 Duration: 23.18 mins Patient history: 65yo M withh/o epilepsy presented with ams. EEG to evaluate for seizure Level of alertness: Awake AEDs during EEG study: LEV Technical aspects: This EEG study was done with scalp electrodes positioned according to the 10-20 International system of electrode placement. Electrical activity was acquired at a sampling rate of 500Hz  and reviewed with a high frequency filter of 70Hz  and a low frequency filter of 1Hz . EEG data were recorded continuously and digitally stored. Description: No posterior dominant rhythm was seen. EEG showed continuous generalized 3 to 6 Hz theta-delta slowing. There is also 3-5hz  sharply contoured theta-delta slowing with overriding 13-15hz  beta activity in left frontotemporal region consistent with breach artifact.  Hyperventilation and photic stimulation were not performed.   ABNORMALITY - Continuous slow, generalized - Breach artifact, left frontotemporal region IMPRESSION: This study is suggestive of cortical dysfunction arising from left frontotemporal region likely secondary to underlying encephalomalacia as well as prior craniotomy. There is also  moderate diffuse encephalopathy, nonspecific etiology. No seizures or definite epileptiform  discharges were seen throughout the recording. Priyanka Barbra Sarks    Medications: I have reviewed the patient's current medications.    Assessment/Plan: This is a very pleasant 65 years old African-American male with a stage IIIb (T3, N1, M0): Adenocarcinoma involving the transverse colon status post partial transverse colectomy in May 2022 status post adjuvant systemic chemotherapy with CAPOX status post 3 cycles last cycle was giving November 19, 2020 with a reduced dose capecitabine and oxaliplatin.  The patient has a lot of complication from this treatment and he was admitted with hypoglycemia, dehydration and diarrhea as well as hand-foot syndrome. Will recommend to continue supportive care at this point including Magic mouthwash in addition to the intravenous hydration and pain control. For the diarrhea, he will continue on Imodium, Lomotil as well as tincture of opium. For the shortness of breath and sleep apnea, he will continue with the BiPAP. Thank you for taking good care of Mr. Mallie Mussel.  We will continue to follow-up the patient with you and assist in his management on as-needed basis.   LOS: 5 days    Eilleen Kempf 12/06/2020

## 2020-12-06 NOTE — Progress Notes (Signed)
    CC: Abdominal pain  Subjective:  Mental status has worsened, patient is completely disoriented this morning, unable to answer questions. Ostomy output remains high at 1700. Worsening AKI.  Objective: Vital signs in last 24 hours: Temp:  [98.1 F (36.7 C)-99.8 F (37.7 C)] 98.8 F (37.1 C) (07/30 0607) Pulse Rate:  [26-134] 102 (07/30 0607) Resp:  [15-37] 19 (07/30 0700) BP: (79-132)/(43-113) 104/51 (07/30 0700) SpO2:  [86 %-100 %] 96 % (07/30 0607) Last BM Date: 12/05/20    Intake/Output from previous day: 07/29 0701 - 07/30 0700 In: 2185.3 [I.V.:2085.3; IV Piggyback:100] Out: 1925 [Urine:225; Stool:1700] Intake/Output this shift: No intake/output data recorded.  General appearance: disoriented, agitated GI: soft, nondistended, RUQ colostomy productive of liquid stool  Lab Results:  Recent Labs    12/05/20 0442 12/06/20 0437  WBC 4.4 4.7  HGB 9.6* 8.6*  HCT 27.6* 25.2*  PLT 264 245    BMET Recent Labs    12/05/20 0442 12/06/20 0437  NA 138 137  K 5.0 5.4*  CL 111 110  CO2 17* 16*  GLUCOSE 221* 179*  BUN 55* 75*  CREATININE 2.60* 3.78*  CALCIUM 7.9* 7.7*   PT/INR No results for input(s): LABPROT, INR in the last 72 hours.  Recent Labs  Lab 11/30/20 2205  AST 26  ALT 29  ALKPHOS 67  BILITOT 1.4*  PROT 6.8  ALBUMIN 3.3*     Lipase     Component Value Date/Time   LIPASE 24 12/01/2020 0018     Medications:  apixaban  5 mg Oral BID   chlorhexidine  15 mL Mouth Rinse BID   Chlorhexidine Gluconate Cloth  6 each Topical Daily   diphenoxylate-atropine  10 mL Oral QID   hydrocerin   Topical BID   insulin aspart  0-15 Units Subcutaneous Q4H   insulin glargine-yfgn  5 Units Subcutaneous Daily   loperamide HCl  2 mg Oral TID   mouth rinse  15 mL Mouth Rinse q12n4p   OLANZapine  5 mg Intramuscular QHS   Opium  0.6 mL Oral Q6H   polycarbophil  625 mg Oral TID    Assessment/Plan Mr. Wegener is a 65 year old male undergoing chemotherapy  for stage IIIb colon cancer after a colectomy with end transverse colostomy earlier this year.  He presented dehydrated with high ostomy output.  We have been titrating up the antimotility agents throughout the week this week.  - Continue lomotil, imodium and tincture of opium - Add fiber supplement - If patient is unable to take meds due to mental status, may need NG placed to administer medications  Remainder of care per primary team     LOS: 5 days    Michaelle Birks, MD [facility] Surgery 12/06/20 8:10 AM

## 2020-12-06 NOTE — Progress Notes (Signed)
SLP Cancellation Note  Patient Details Name: Thomas [name] MRN: [id number] DOB: Aug 12, 1955   Cancelled treatment:       Reason Eval/Treat Not Completed: Patient not medically ready;Medical issues which prohibited therapy. BIPAP secondary to decreased oxygen saturations when on NRB and 15L HFNC. Patient also with increased confusion. SLP will continue to follow for patient readiness for BSE   [name] Baller, MA, CCC-SLP Speech Therapy

## 2020-12-06 NOTE — Progress Notes (Signed)
Patient very agitated and calling out, sounding very congested. This RN suctioned thick brown secretions from patient's mouth. Patient respiratory status began declining, HFNC increased to 15L and MD notified. Coarse crackles auscultated bilaterally. O2 sats persistently mid to low 80s, NRB applied. MD at bedside, BiPAP, CXR, and 1 time dose of 80mg  lasix ordered Patient now on BiPAP, O2 sats mid 90s. Will continue to monitor

## 2020-12-06 NOTE — Progress Notes (Signed)
Called [facility] and told them about the situation on the ABG. They said they will get in touch with the RN.

## 2020-12-06 NOTE — Progress Notes (Signed)
RT tried twice to obtain ABG but was not able to do so. RN aware. May need to obtain VBG.

## 2020-12-06 NOTE — Progress Notes (Addendum)
    BRIEF OVERNIGHT PROGRESS REPORT  SUBJECTIVE:Notified by primary RN that patient is hypotensive on the cardizem gtt and remains in afib with RVR. He is currently on BiPAP but with worsening confusion, agitation and restlessness.  OBJECTIVE:He is afebrile with blood pressure 83/66 mm Hg and pulse rate afib 124 beats/min. He is currently altered and trying to pull off the Bipap. Venous Blood Gas result:  pO2 <32; pCO2 33.6; pH 7.2;  HCO3 13.5, %O2 Sat 34.2.  ASSESSMENT:65 y.o male admitted with SIRS/Lactic acidosis now complicated with Afib with RVR, acute hypoxic respiratory failure secondary to aspiration pneumonia and severe delirium.  PLAN: Acute Hypoxic  Respiratory Failure secondary to Aspiration Pneumonia, volume overload? -Supplemental O2 as needed to maintain O2 saturations 88 to 92% -BiPAP, wean as tolerated -High risk for intubation -Follow intermittent ABG and chest x-ray as needed -Will give sodium bicarb to correct acidosis -Repeat CXR  -Repeat Labs: CBC, Lactic, Procal, BMP+Mag -Received IV Lasix  80 mg x 1 dose on dayshift -PCCM consult STAT. Discussed with oncall intensivist Dr. Marchelle Folks who will evaluate patient at the bedside shortly for possible intubation.  Sepsis /septic shock due to suspected Aspiration Pneumonia (Criteria:  Temperature 99.1, respiratory rate greater than 20/min, heart rate greater than 90 bpm, acutely altered mental status, WBC trending up + suspected infection ) Lactic: 3.7, PCT: 32.28, UA: pending, CXR: pending - Supplemental oxygen as needed, to maintain SpO2 > 90% - f/u cultures, trend lactic/ PCT - Daily CBC - monitor WBC/ fever curve - IV antibiotics: Unasyn (aspiration PNA) - IVF hydration as needed - Consider vasopressors to maintain MAP> 65 - Strict I/O's: Goal UOP >0.5 mL/kg/hr  Afib+RVR likely in the setting of above - Keep NPO for option of TEEcho + DCCardioversion - Stop Diltiazem due to hypotension and start  Amiodarone 150 mg IV bolus, then 1mg /min x6 hrs, then 0.5mg /min for 18 hours,  # Thromboembolism Risk Management. Holding Eliquis for now - Cardiology EP Consultation for other regimens,   Acute Metabolic Encephalopathy due to Hypoxia? Delirium? Hx of Seizures -CT head negative for acute intracranial abnormality -Provide supportive care -BiPAP for now   AKI on CKD Stage III -Monitor I&O's / urinary output -Follow BMP -Ensure adequate renal perfusion -Avoid nephrotoxic agents as able -Replace electrolytes as indicated   Diabetes mellitus Hyperglycemia without DKA -CBGs -Sliding scale insulin -Follow ICU hyper/hypoglycemia protocol -Hold home Metformin         Rufina Falco, DNP, CCRN, FNP-C, AGACNP-BC Acute Care Nurse Practitioner  [facility] Pager: [phone] [place] at [facility]

## 2020-12-06 NOTE — Progress Notes (Signed)
Patient placed on BIPAP due to increase WOB and SOB. He was currently on 100% NRB and 15lHF [place] and was no tolerating well with decreased oxygen saturations. He is tolerating the BIPAP at this time with saturations 96%. RT will continue to monitor closely

## 2020-12-06 NOTE — Consult Note (Signed)
NAME:  Thomas [name], MRN:  [id number], DOB:  Feb 22, 1956, LOS: 5 ADMISSION DATE:  11/30/2020, CONSULTATION DATE:  7/30 REFERRING MD:  Riccardo Dubin, CHIEF COMPLAINT:  Worsening hypoxia/dyspnea  History of Present Illness:  65 yo M admitted for worsening sepsis vs reaction to chemotherapy with high ostomy output and soft BP on 7/25. Has had issues during his hospitalization with recurrent aspirations and more recently worsening respiratory status.  Most recently pt developed worsening hypoxia and was requiring BIPAP w/ FIO2 of 100% and increased work of breathing. Unable to obtain ABG, CXR in AM of 7/30 showed some worsening concern of aspiration.   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  Hx of remote MCA stroke, Asthma hx as a child, epilepsy on keppra, GERD, T2DM on metformin as outpt, HTN, HLD, OSA  Significant Hospital Events: Including procedures, antibiotic start and stop dates in addition to other pertinent events   Transferred to [facility] 7/27, stopped abx as sepsis resolved/ruled out Worsened AME, more hypoxic with worsening renal funciton, new aspiration on 7/30, abx restarted Pt with increased o2 requirement on 100% FIO2 on BIPAP, d/w family who wanted to proceed with intubation, pt intubated 7/30 in PM.  Interim History / Subjective:  Pt minimally responsive, agitated and grunting, withdraws from pain but does not follow commands  Objective   Blood pressure 105/64, pulse 91, temperature 99.7 F (37.6 C), temperature source Axillary, resp. rate (!) 24, height 5\' 8"  (1.727 m), weight 116.6 kg, SpO2 100 %.    Vent Mode: PRVC FiO2 (%):  [50 %-100 %] 100 % Set Rate:  [15 bmp] 15 bmp Vt Set:  [540 mL] 540 mL PEEP:  [7 cmH20-8 cmH20] 8 cmH20 Pressure Support:  [7 cmH20] 7 cmH20 Plateau Pressure:  [19 cmH20] 19 cmH20    Intake/Output Summary (Last 24 hours) at 12/06/2020 2323 Last data filed at 12/06/2020 2038 Gross per 24 hour  Intake 1472.5 ml  Output 1260 ml  Net 212.5 ml   Filed Weights   12/01/20 0825 12/06/20 0943  Weight: 111.8 kg 116.6 kg   Examination: Physical Exam Vitals and nursing note reviewed.  Constitutional:      General: He is in acute distress.     Appearance: He is obese. He is ill-appearing.     Comments: On BIPAP  HENT:     Mouth/Throat:     Mouth: Mucous membranes are dry.  Eyes:     Conjunctiva/sclera: Conjunctivae normal.  Cardiovascular:     Rate and Rhythm: Tachycardia present. Rhythm irregular.  Pulmonary:     Effort: Accessory muscle usage and respiratory distress present.     Breath sounds: Decreased air movement present. Examination of the left-upper field reveals rhonchi. Examination of the left-middle field reveals rhonchi. Examination of the right-lower field reveals rhonchi. Examination of the left-lower field reveals rhonchi. Decreased breath sounds and rhonchi present.  Abdominal:     General: Abdomen is flat.     Palpations: There is no mass.     Tenderness: There is no abdominal tenderness.  Musculoskeletal:     Cervical back: Neck supple.     Right lower leg: No edema.     Left lower leg: No edema.  Skin:    General: Skin is warm and dry.  Neurological:     Mental Status: He is lethargic and disoriented.  Psychiatric:  Behavior: Behavior is uncooperative.   Resolved Hospital Problem list   - Assessment & Plan:  Pt is a 65 yo M w/ a hx of stage IIIb colon cancer s/p colectomy and colostomy May 2022 on chemo, remote MCA stroke, T2DM, HTN, HLD, questionable asthma hx, OSA, GERD, who is being transferred to [place] service for worsening AHRF 2/2 aspiration PNA in the setting of worsening AME.  #AHRF 2/2 Aspiration pneumonitis and RLL collapse- worsening requiring max BIPAP FIO2 overnight 7/30. CXR showed interval worsening and during intubation  pt noted to have significant amount of thick dried secretions in posterior oropharynx.   - Cont Unasyn for now  - Airway clearance w/ CPT per RT   #Oliguric AKI on CKD2 w/ hyperkalemia, worsening non anion gap metabolic acidosis, possibly ATN- intermittent hypotension during last few days in hospital with some diuresis given during the day particularly in light of worsening respiratory status. No overt volume overload on exam at present and worsening acidosis and uptrending Cr 7/30, having significant stool output, so would favor trial of IVF now.  - 1L LR now  - Trend BMP and VBG - may need CRRT or bicarb gtt this evening  - nephrology consulted 7/30 by hospitalist team, f/u recs   #Atrial fibrillation with RVR - hemodynamically stable for the most part, though some intermittent hypotension over past 24hrs. Echocardiogram with preserved LF systolic function. Will use alternative like amio if BP can't tolerate BB.   - cont cardizem gtt  - cont to hold apixaban for now   #Colon cancer, s/p colostomy with recent course of chemo c/b diarrhea. Recent dose of chemotherapy.   - Heme/onc following  - Anti  #T2DM w/ hyperglycemia -   - cont ISS q4hrs and QHS   #Seizures - EEG previously with encephalopathy but no active seizures  - Cont IV keppra     #Obesity class 2/ moderate calorie protein malnutrition.   - TF, nutrition consult  #Stage 2 coccyx pressure ulcer (present on admission), BMI is 37. Moderate calorie protein malnutrition.   - wound care  Best Practice (right click and "Reselect all SmartList Selections" daily)   Diet/type: tubefeeds DVT prophylaxis: prophylactic heparin  GI prophylaxis: H2B Lines: Port Foley:  Yes, and it is still needed Code Status:  full code Last date of goals of care discussion [12/06/20] - discussed with pts wife and adult children regarding code status  Labs   CBC: Recent Labs  Lab 12/02/20 0308 12/03/20 0251 12/04/20 0522 12/05/20 0442  12/06/20 0437 12/06/20 2119  WBC 4.4 5.2 4.2 4.4 4.7 6.1  NEUTROABS 1.9  --  1.5* 1.8 2.0 3.9  HGB 10.2* 9.2* 9.7* 9.6* 8.6* 8.8*  HCT 28.6* 26.3* 27.8* 27.6* 25.2* 25.6*  MCV 74.7* 75.4* 76.6* 77.5* 78.8* 76.9*  PLT 179 201 253 264 245 469    Basic Metabolic Panel: Recent Labs  Lab 12/01/20 0432 12/01/20 1226 12/01/20 1549 12/02/20 0308 12/03/20 0251 12/04/20 0522 12/05/20 0442 12/06/20 0437 12/06/20 2119  NA 132*   < >  --  137 139 139 138 137 138  K 5.1   < >  --  4.7 4.4 4.8 5.0 5.4* 5.4*  CL 106   < >  --  111 112* 112* 111 110 109  CO2 15*   < >  --  19* 20* 21* 17* 16* 14*  GLUCOSE 306*   < >  --  250* 217* 218* 221* 179* 170*  BUN 58*   < >  --  56* 48* 43* 55* 75* 88*  CREATININE 2.12*   < >  --  1.96* 1.44* 1.49* 2.60* 3.78* 4.69*  CALCIUM 8.2*   < >  --  8.2* 8.3* 8.0* 7.9* 7.7* 8.0*  MG 1.7  --  2.1 1.9 2.3  --   --   --  2.4  PHOS  --   --   --   --  2.7  --   --   --   --    < > = values in this interval not displayed.   GFR: Estimated Creatinine Clearance: 19.7 mL/min (A) (by C-G formula based on SCr of 4.69 mg/dL (H)). Recent Labs  Lab 12/01/20 0228 12/01/20 0432 12/02/20 0308 12/02/20 0623 12/02/20 1220 12/03/20 0251 12/04/20 0522 12/05/20 0442 12/06/20 0437 12/06/20 2119  PROCALCITON  --   --   --   --  0.60 0.45  --   --   --  32.28  WBC  --  4.1   < >  --   --  5.2 4.2 4.4 4.7 6.1  LATICACIDVEN 5.4* 4.3*  --  1.8  --   --   --   --   --  3.7*   < > = values in this interval not displayed.    Liver Function Tests: Recent Labs  Lab 11/30/20 2205  AST 26  ALT 29  ALKPHOS 67  BILITOT 1.4*  PROT 6.8  ALBUMIN 3.3*   Recent Labs  Lab 12/01/20 0018  LIPASE 24   No results for input(s): AMMONIA in the last 168 hours.  ABG    Component Value Date/Time   PHART 7.459 (H) 09/01/2016 1707   PCO2ART 37.5 09/01/2016 1707   PO2ART 87.0 09/01/2016 1707   HCO3 13.5 (L) 12/06/2020 2119   TCO2 28 09/01/2016 1707   ACIDBASEDEF 12.7 (H)  12/06/2020 2119   O2SAT 34.2 12/06/2020 2119     Coagulation Profile: Recent Labs  Lab 11/30/20 2205  INR 1.5*    Cardiac Enzymes: No results for input(s): CKTOTAL, CKMB, CKMBINDEX, TROPONINI in the last 168 hours.  HbA1C: Hgb A1c MFr Bld  Date/Time Value Ref Range Status  12/01/2020 04:32 AM 8.7 (H) 4.8 - 5.6 % Final    Comment:    (NOTE)         Prediabetes: 5.7 - 6.4         Diabetes: >6.4         Glycemic control for adults with diabetes: <7.0   10/02/2020 12:32 AM 7.4 (H) 4.8 - 5.6 % Final    Comment:    (NOTE)         Prediabetes: 5.7 - 6.4         Diabetes: >6.4         Glycemic control for adults with diabetes: <7.0     CBG: Recent Labs  Lab 12/06/20 0814 12/06/20 1137 12/06/20 1142 12/06/20 1622 12/06/20 1936  GLUCAP 144* 14* 139* 136* 157*    Review of Systems:   Review of Systems  Unable to perform ROS: Patient unresponsive   Past Medical History:  He,  has a past medical history of Acute ischemic left MCA stroke ([place]) (04/09/2014), Acute respiratory failure with hypoxia ([place]) (04/09/2014), Aspiration pneumonia ([place]), Asthma, Brain tumor ([place]), Cancer ([place]), Confusion, Diabetes mellitus without complication ([place]), Dizziness, Dyspnea, Epilepsy ([place]), GERD (gastroesophageal reflux disease), Headache, Hyperlipidemia, Hypertension, Peripheral edema, Peripheral neuropathy, Pneumonia (3 yrs ago), Seizures ([place]), Sleep apnea, and Urinary frequency.  Surgical History:   Past Surgical History:  Procedure Laterality Date   BIOPSY  09/05/2020   Procedure: BIOPSY;  Surgeon: Irene Shipper, MD;  Location: [facility] ENDOSCOPY;  Service: Endoscopy;;   CARDIAC CATHETERIZATION  7 yrs ago   COLONOSCOPY WITH PROPOFOL N/A 09/05/2020   Procedure: COLONOSCOPY WITH PROPOFOL;  Surgeon: Irene Shipper, MD;  Location: [facility] ENDOSCOPY;  Service: Endoscopy;  Laterality: N/A;   CRANIOTOMY N/A 09/01/2016   Procedure: CRANIOTOMY TUMOR  LEFT PTERIONAL;  Surgeon: Ashok Pall, MD;  Location:  [place];  Service: Neurosurgery;  Laterality: N/A;   cyst removed from chest      as a child   NO PAST SURGERIES     PARTIAL COLECTOMY N/A 09/10/2020   Procedure: TRANSVERSE COLECTOMY;  Surgeon: [name], MD;  Location: [place];  Service: General;  Laterality: N/A;   POLYPECTOMY  09/05/2020   Procedure: POLYPECTOMY;  Surgeon: Irene Shipper, MD;  Location: [facility] ENDOSCOPY;  Service: Endoscopy;;   PORTACATH PLACEMENT Right 10/01/2020   Procedure: INSERTION PORT-A-CATH;  Surgeon: [name], MD;  Location: [place];  Service: General;  Laterality: Right;   SUBMUCOSAL TATTOO INJECTION  09/05/2020   Procedure: SUBMUCOSAL TATTOO INJECTION;  Surgeon: Irene Shipper, MD;  Location: [facility] ENDOSCOPY;  Service: Endoscopy;;     Social History:   reports that he has quit smoking. He has never used smokeless tobacco. He reports that he does not drink alcohol and does not use drugs.   Family History:  His family history includes Breast cancer in his maternal aunt and mother; Prostate cancer in his maternal uncle.   Allergies No Known Allergies   Home Medications  Prior to Admission medications   Medication Sig Start Date End Date Taking? Authorizing Provider  acetaminophen (TYLENOL) 500 MG tablet Take 1,000 mg by mouth 3 (three) times daily as needed for headache (pain).   Yes [provider]  albuterol (PROVENTIL HFA;VENTOLIN HFA) 108 (90 BASE) MCG/ACT inhaler Inhale 2 puffs into the lungs every 4 (four) hours as needed for wheezing or shortness of breath. 04/09/15  Yes Muthersbaugh, Jarrett Soho, PA-C  amLODipine (NORVASC) 10 MG tablet Take 10 mg by mouth daily. 05/11/20  Yes [provider]  capecitabine (XELODA) 500 MG tablet Take 3 tablets (1,500 mg total) by mouth 2 (two) times daily after a meal. 11/19/20  Yes Ladell Pier, MD  gabapentin (NEURONTIN) 300 MG capsule Take 1 capsule (300 mg total) by mouth 3 (three) times daily. 04/06/16  Yes Varney Biles, MD  levETIRAcetam (KEPPRA)  1000 MG tablet Take 1,000 mg by mouth 2 (two) times daily. 07/28/16  Yes [provider]  metFORMIN (GLUCOPHAGE-XR) 500 MG 24 hr tablet Take 500 mg by mouth 2 (two) times daily. 07/27/20  Yes [provider]  omeprazole (PRILOSEC) 20 MG capsule Take 20 mg by mouth daily. 08/10/16  Yes [provider]  atorvastatin (LIPITOR) 20 MG tablet Take 20 mg by mouth daily at 6 PM.    [provider]  lidocaine-prilocaine (EMLA) cream Apply 1 application topically as directed. Apply 1/2 tablespoon to port site 2 hours prior to stick and cover with plastic wrap to numb site Patient not taking: Reported on 10/08/2020 09/24/20   Ladell Pier, MD  ondansetron (ZOFRAN) 8 MG tablet Take 1 tablet (8 mg total) by mouth every 8 (eight) hours as needed for nausea or vomiting. Patient not taking: No sig reported 09/24/20   Ladell Pier, MD  prochlorperazine (COMPAZINE)  10 MG tablet Take 1 tablet (10 mg total) by mouth every 6 (six) hours as needed. Patient not taking: No sig reported 09/24/20   Ladell Pier, MD     Critical care time: 60 min

## 2020-12-06 NOTE — Consult Note (Signed)
Renal Service Consult Note [facility]  Thomas [name] 12/06/2020 Sol Blazing, MD Requesting Physician: Dr Cathlean Sauer  Reason for Consult: Renal failure HPI: The patient is a 65 y.o. year-old w/ hx of acute CVA (2015), brain tumor, DM, epilepsy, HL, HTN presented on 7/24 w/ abd pain, gen'd weakness xo 2 wks. Hx of colon Ca IIIb rx'd w/ surgery, recently started on chemoRx 3rd cycle 3 days prior. Pt was seen in outside [facility] ED and tx'd to [facility] w/ suspected sepsis / lactic acidosis w/ afib and RVR. Then pt developed severe delirium and MOSF w/ asp PNA. Has known encephalomalacia by CT from prior brain tumor surgery. Now renal function is declining, asked to see for renal failure.   Pt seen in ICU, on bipap, not very responsive.    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   Dizziness    Dyspnea    pt states d/t weight   Epilepsy ([place])    takes Keppra daily   GERD (gastroesophageal reflux disease)    takes Omeprazole daily   Headache    Hyperlipidemia    takes Atorvastatin daily   Hypertension    takes Lotrel daily   Peripheral edema    takes Lasix daily   Peripheral neuropathy    takes Gabapentin daily   Pneumonia 3 yrs ago   hx of   Seizures ([place])    Sleep apnea    Urinary frequency    Past Surgical History  Past Surgical History:  Procedure Laterality Date   BIOPSY  09/05/2020   Procedure: BIOPSY;  Surgeon: Irene Shipper, MD;  Location: [facility] ENDOSCOPY;  Service: Endoscopy;;   CARDIAC CATHETERIZATION  7 yrs ago   COLONOSCOPY WITH PROPOFOL N/A 09/05/2020   Procedure: COLONOSCOPY WITH PROPOFOL;  Surgeon: Irene Shipper, MD;   Location: [facility] ENDOSCOPY;  Service: Endoscopy;  Laterality: N/A;   CRANIOTOMY N/A 09/01/2016   Procedure: CRANIOTOMY TUMOR  LEFT PTERIONAL;  Surgeon: Ashok Pall, MD;  Location: [place];  Service: Neurosurgery;  Laterality: N/A;   cyst removed from chest      as a child   NO PAST SURGERIES     PARTIAL COLECTOMY N/A 09/10/2020   Procedure: TRANSVERSE COLECTOMY;  Surgeon: [name], MD;  Location: [place];  Service: General;  Laterality: N/A;   POLYPECTOMY  09/05/2020   Procedure: POLYPECTOMY;  Surgeon: Irene Shipper, MD;  Location: [facility] ENDOSCOPY;  Service: Endoscopy;;   PORTACATH PLACEMENT Right 10/01/2020   Procedure: INSERTION PORT-A-CATH;  Surgeon: [name], MD;  Location: [place];  Service: General;  Laterality: Right;   SUBMUCOSAL TATTOO INJECTION  09/05/2020   Procedure: SUBMUCOSAL TATTOO INJECTION;  Surgeon: Irene Shipper, MD;  Location: [facility] ENDOSCOPY;  Service: Endoscopy;;   Family History  Family History  Problem Relation Age of Onset   Breast cancer Mother    Breast cancer Maternal Aunt    Prostate cancer Maternal Uncle    Social History  reports that he has quit smoking. He has never used smokeless tobacco. He reports  that he does not drink alcohol and does not use drugs. Allergies No Known Allergies Home medications Prior to Admission medications   Medication Sig Start Date End Date Taking? Authorizing Provider  acetaminophen (TYLENOL) 500 MG tablet Take 1,000 mg by mouth 3 (three) times daily as needed for headache (pain).   Yes [provider]  albuterol (PROVENTIL HFA;VENTOLIN HFA) 108 (90 BASE) MCG/ACT inhaler Inhale 2 puffs into the lungs every 4 (four) hours as needed for wheezing or shortness of breath. 04/09/15  Yes Muthersbaugh, Jarrett Soho, PA-C  amLODipine (NORVASC) 10 MG tablet Take 10 mg by mouth daily. 05/11/20  Yes [provider]  capecitabine (XELODA) 500 MG tablet Take 3 tablets (1,500 mg total) by mouth 2 (two) times daily after a meal. 11/19/20   Yes Ladell Pier, MD  gabapentin (NEURONTIN) 300 MG capsule Take 1 capsule (300 mg total) by mouth 3 (three) times daily. 04/06/16  Yes Varney Biles, MD  levETIRAcetam (KEPPRA) 1000 MG tablet Take 1,000 mg by mouth 2 (two) times daily. 07/28/16  Yes [provider]  metFORMIN (GLUCOPHAGE-XR) 500 MG 24 hr tablet Take 500 mg by mouth 2 (two) times daily. 07/27/20  Yes [provider]  omeprazole (PRILOSEC) 20 MG capsule Take 20 mg by mouth daily. 08/10/16  Yes [provider]  atorvastatin (LIPITOR) 20 MG tablet Take 20 mg by mouth daily at 6 PM.    [provider]  lidocaine-prilocaine (EMLA) cream Apply 1 application topically as directed. Apply 1/2 tablespoon to port site 2 hours prior to stick and cover with plastic wrap to numb site Patient not taking: Reported on 10/08/2020 09/24/20   Ladell Pier, MD  ondansetron (ZOFRAN) 8 MG tablet Take 1 tablet (8 mg total) by mouth every 8 (eight) hours as needed for nausea or vomiting. Patient not taking: No sig reported 09/24/20   Ladell Pier, MD  prochlorperazine (COMPAZINE) 10 MG tablet Take 1 tablet (10 mg total) by mouth every 6 (six) hours as needed. Patient not taking: No sig reported 09/24/20   Betsy Coder B, MD     Vitals:   12/06/20 3825 12/06/20 0700 12/06/20 0826 12/06/20 0845  BP: (!) 121/58 (!) 104/51    Pulse: (!) 102   (!) 101  Resp: (!) 24 19  (!) 28  Temp: 98.8 F (37.1 C)  98.9 F (37.2 C)   TempSrc: Axillary     SpO2: 96%   96%  Weight:      Height:       Exam Gen on bipap, lethargic No rash, cyanosis or gangrene Sclera anicteric, throat clear  No jvd or bruits Chest occ rhonchi, R upper chest port, no rales/ wheezing RRR no MRG Abd soft ntnd no mass / ascites, RLQ colostomy GU foley draining clear urine MS no joint effusion Ext 1+ bilat pretib edema, no hip / UE edema, no wounds or ulcers Neuro is lethargic    Home meds include norvasc , lipitor, xeloda, neurontin,  keppra, metfromin, prilosec, prns       Date   Creat   eGFR    4/28- 09/16/20 1.04- 1.67  46- >60    5/25 - 10/08/20  4.22 >> 1.62  AKI, 15 > 60 ml/min    10/08/20  1.34   60    6/22   1.56    11/15/20    1.50   52, IIIA    11/19/20  1.83   41    7/21.22  1.60  48, IIIA    7/24- 7/30  1.73- 3.78  17- 54      UOP last 5 days >> 1 L , 1L, 1L, 890 cc , 560 cc and 345 cc yesterday    BP's are soft in the 90's up to 100's- 110's at the highest    HR's in the 100- 130 range, RR 18-24 temp afeb       UA - 100 prot, o/w neg   UNa 82, UCr 65   13 L in and 11 L out =  +2 L     CXR's - basically w/o acute findings    ECHO 7/26 - EF 60- 65%, RV wnl, no valve issues    Renal US -10-12 cm kidneys w/o hydro  Assessment/ Plan: AKI on CKD 3A - b/l creat from early July 2022 is 1.5- 1.6, eGFR 48- 52. Creat 2.1 on admission, now up to 3.78 today. UOP dropping off. BP's have been soft the last several days which coincides w/ dropping UOP and rising creatinine. Has not required pressors. Suspect hypoperfusion causing hemodynamic AKI +/- ATN. He is not vol depleted. Rx is supportive care, avoid nephrotoxic agents (nsaid/ IV vanc/ ace/ arb). Will follow.  SIRS/ asp PNA/ acute hypoxemic resp failure/ lactic acidosis - was on cefepime x 4days, now is on unasyn IV. Is on bipap.  [place] Afib /RVR H/o colon Ca - stage IIIb, rx'd w/ surgery 09/2020, getting chemoRx sp 3 cycles DM2  Hypotension - not severe, 90's - 110 range      Rob Erlean Mealor  MD 12/06/2020, 9:42 AM  Recent Labs  Lab 12/05/20 0442 12/06/20 0437  WBC 4.4 4.7  HGB 9.6* 8.6*   Recent Labs  Lab 12/03/20 0251 12/04/20 0522 12/05/20 0442 12/06/20 0437  K 4.4   < > 5.0 5.4*  BUN 48*   < > 55* 75*  CREATININE 1.44*   < > 2.60* 3.78*  CALCIUM 8.3*   < > 7.9* 7.7*  PHOS 2.7  --   --   --    < > = values in this interval not displayed.

## 2020-12-07 ENCOUNTER — Inpatient Hospital Stay (HOSPITAL_COMMUNITY): Payer: Medicare HMO

## 2020-12-07 DIAGNOSIS — E872 Acidosis: Secondary | ICD-10-CM

## 2020-12-07 DIAGNOSIS — I4891 Unspecified atrial fibrillation: Secondary | ICD-10-CM | POA: Diagnosis not present

## 2020-12-07 DIAGNOSIS — N171 Acute kidney failure with acute cortical necrosis: Secondary | ICD-10-CM

## 2020-12-07 DIAGNOSIS — J9601 Acute respiratory failure with hypoxia: Secondary | ICD-10-CM | POA: Diagnosis not present

## 2020-12-07 DIAGNOSIS — R6521 Severe sepsis with septic shock: Secondary | ICD-10-CM

## 2020-12-07 DIAGNOSIS — R579 Shock, unspecified: Secondary | ICD-10-CM | POA: Clinically undetermined

## 2020-12-07 DIAGNOSIS — E1100 Type 2 diabetes mellitus with hyperosmolarity without nonketotic hyperglycemic-hyperosmolar coma (NKHHC): Secondary | ICD-10-CM | POA: Diagnosis not present

## 2020-12-07 DIAGNOSIS — N179 Acute kidney failure, unspecified: Secondary | ICD-10-CM | POA: Diagnosis not present

## 2020-12-07 HISTORY — DX: Acute respiratory failure with hypoxia: J96.01

## 2020-12-07 LAB — BLOOD GAS, ARTERIAL
Acid-base deficit: 13.2 mmol/L — ABNORMAL HIGH (ref 0.0–2.0)
Acid-base deficit: 10.7 mmol/L — ABNORMAL HIGH (ref 0.0–2.0)
Acid-base deficit: 11.1 mmol/L — ABNORMAL HIGH (ref 0.0–2.0)
Bicarbonate: 13.3 mmol/L — ABNORMAL LOW (ref 20.0–28.0)
Bicarbonate: 13.6 mmol/L — ABNORMAL LOW (ref 20.0–28.0)
Bicarbonate: 13.4 mmol/L — ABNORMAL LOW (ref 20.0–28.0)
Drawn by: 59133
Drawn by: 22052
FIO2: 100
FIO2: 100
FIO2: 50
MECHVT: 540 mL
MECHVT: 540 mL
MECHVT: 600 mL
O2 Saturation: 53 %
O2 Saturation: 93.7 %
O2 Saturation: 91.4 %
PEEP: 10 cmH2O
PEEP: 10 cmH2O
Patient temperature: 98.5
Patient temperature: 98.6
Patient temperature: 98.4
RATE: 24 resp/min
RATE: 24 resp/min
RATE: 26 resp/min
pCO2 arterial: 34.5 mmHg (ref 32.0–48.0)
pCO2 arterial: 27 mmHg — ABNORMAL LOW (ref 32.0–48.0)
pCO2 arterial: 26.1 mmHg — ABNORMAL LOW (ref 32.0–48.0)
pH, Arterial: 7.211 — ABNORMAL LOW (ref 7.350–7.450)
pH, Arterial: 7.323 — ABNORMAL LOW (ref 7.350–7.450)
pH, Arterial: 7.33 — ABNORMAL LOW (ref 7.350–7.450)
pO2, Arterial: 39.1 mmHg — CL (ref 83.0–108.0)
pO2, Arterial: 77.3 mmHg — ABNORMAL LOW (ref 83.0–108.0)
pO2, Arterial: 72.1 mmHg — ABNORMAL LOW (ref 83.0–108.0)

## 2020-12-07 LAB — PHOSPHORUS
Phosphorus: 6.6 mg/dL — ABNORMAL HIGH (ref 2.5–4.6)
Phosphorus: 5 mg/dL — ABNORMAL HIGH (ref 2.5–4.6)

## 2020-12-07 LAB — GLUCOSE, CAPILLARY
Glucose-Capillary: 209 mg/dL — ABNORMAL HIGH (ref 70–99)
Glucose-Capillary: 198 mg/dL — ABNORMAL HIGH (ref 70–99)
Glucose-Capillary: 186 mg/dL — ABNORMAL HIGH (ref 70–99)
Glucose-Capillary: 175 mg/dL — ABNORMAL HIGH (ref 70–99)
Glucose-Capillary: 173 mg/dL — ABNORMAL HIGH (ref 70–99)
Glucose-Capillary: 142 mg/dL — ABNORMAL HIGH (ref 70–99)

## 2020-12-07 LAB — RENAL FUNCTION PANEL
Albumin: 1.7 g/dL — ABNORMAL LOW (ref 3.5–5.0)
Anion gap: 15 (ref 5–15)
BUN: 88 mg/dL — ABNORMAL HIGH (ref 8–23)
CO2: 15 mmol/L — ABNORMAL LOW (ref 22–32)
Calcium: 7.2 mg/dL — ABNORMAL LOW (ref 8.9–10.3)
Chloride: 109 mmol/L (ref 98–111)
Creatinine, Ser: 4.91 mg/dL — ABNORMAL HIGH (ref 0.61–1.24)
GFR, Estimated: 12 mL/min — ABNORMAL LOW (ref >60)
Glucose, Bld: 166 mg/dL — ABNORMAL HIGH (ref 70–99)
Phosphorus: 5.1 mg/dL — ABNORMAL HIGH (ref 2.5–4.6)
Potassium: 4.8 mmol/L (ref 3.5–5.1)
Sodium: 139 mmol/L (ref 135–145)

## 2020-12-07 LAB — BLOOD GAS, VENOUS
Acid-base deficit: 14.6 mmol/L — ABNORMAL HIGH (ref 0.0–2.0)
Bicarbonate: 12.1 mmol/L — ABNORMAL LOW (ref 20.0–28.0)
O2 Saturation: 67.8 %
Patient temperature: 98.6
pCO2, Ven: 32.4 mmHg — ABNORMAL LOW (ref 44.0–60.0)
pH, Ven: 7.198 — CL (ref 7.250–7.430)
pO2, Ven: 46.1 mmHg — ABNORMAL HIGH (ref 32.0–45.0)

## 2020-12-07 LAB — CBC
HCT: 21.5 % — ABNORMAL LOW (ref 39.0–52.0)
Hemoglobin: 7.6 g/dL — ABNORMAL LOW (ref 13.0–17.0)
MCH: 26.4 pg (ref 26.0–34.0)
MCHC: 35.3 g/dL (ref 30.0–36.0)
MCV: 74.7 fL — ABNORMAL LOW (ref 80.0–100.0)
Platelets: 242 10*3/uL (ref 150–400)
RBC: 2.88 MIL/uL — ABNORMAL LOW (ref 4.22–5.81)
RDW: 22.5 % — ABNORMAL HIGH (ref 11.5–15.5)
WBC: 7.5 10*3/uL (ref 4.0–10.5)
nRBC: 17.5 % — ABNORMAL HIGH (ref 0.0–0.2)

## 2020-12-07 LAB — MAGNESIUM
Magnesium: 2.3 mg/dL (ref 1.7–2.4)
Magnesium: 2.3 mg/dL (ref 1.7–2.4)

## 2020-12-07 LAB — RETICULOCYTES
Immature Retic Fract: 29.2 % — ABNORMAL HIGH (ref 2.3–15.9)
RBC.: 2.89 MIL/uL — ABNORMAL LOW (ref 4.22–5.81)
Retic Count, Absolute: 95.7 10*3/uL (ref 19.0–186.0)
Retic Ct Pct: 3.3 % — ABNORMAL HIGH (ref 0.4–3.1)

## 2020-12-07 LAB — LACTIC ACID, PLASMA: Lactic Acid, Venous: 3.6 mmol/L (ref 0.5–1.9)

## 2020-12-07 LAB — BASIC METABOLIC PANEL
Anion gap: 13 (ref 5–15)
Anion gap: 16 — ABNORMAL HIGH (ref 5–15)
BUN: 90 mg/dL — ABNORMAL HIGH (ref 8–23)
BUN: 89 mg/dL — ABNORMAL HIGH (ref 8–23)
CO2: 13 mmol/L — ABNORMAL LOW (ref 22–32)
CO2: 14 mmol/L — ABNORMAL LOW (ref 22–32)
Calcium: 7.7 mg/dL — ABNORMAL LOW (ref 8.9–10.3)
Calcium: 7.8 mg/dL — ABNORMAL LOW (ref 8.9–10.3)
Chloride: 109 mmol/L (ref 98–111)
Chloride: 107 mmol/L (ref 98–111)
Creatinine, Ser: 5.2 mg/dL — ABNORMAL HIGH (ref 0.61–1.24)
Creatinine, Ser: 5.49 mg/dL — ABNORMAL HIGH (ref 0.61–1.24)
GFR, Estimated: 12 mL/min — ABNORMAL LOW (ref >60)
GFR, Estimated: 11 mL/min — ABNORMAL LOW (ref >60)
Glucose, Bld: 203 mg/dL — ABNORMAL HIGH (ref 70–99)
Glucose, Bld: 193 mg/dL — ABNORMAL HIGH (ref 70–99)
Potassium: 5.7 mmol/L — ABNORMAL HIGH (ref 3.5–5.1)
Potassium: 5.1 mmol/L (ref 3.5–5.1)
Sodium: 135 mmol/L (ref 135–145)
Sodium: 137 mmol/L (ref 135–145)

## 2020-12-07 LAB — TRIGLYCERIDES: Triglycerides: 403 mg/dL — ABNORMAL HIGH (ref <150)

## 2020-12-07 LAB — PREPARE RBC (CROSSMATCH)

## 2020-12-07 LAB — PROCALCITONIN: Procalcitonin: 30.55 ng/mL

## 2020-12-07 MED ORDER — ACETAMINOPHEN 160 MG/5ML PO SOLN
1000.0000 mg | Freq: Three times a day (TID) | ORAL | Status: DC | PRN
  Administered 2020-12-21 – 2020-12-29 (×4): 1000 mg
  Filled 2020-12-07 (×4): qty 40.6

## 2020-12-07 MED ORDER — PHENYLEPHRINE HCL-NACL 10-0.9 MG/250ML-% IV SOLN
INTRAVENOUS | Status: AC
  Administered 2020-12-07: 25 ug/min via INTRAVENOUS
  Filled 2020-12-07: qty 250

## 2020-12-07 MED ORDER — HEPARIN SODIUM (PORCINE) 1000 UNIT/ML DIALYSIS: 1000.0000 [IU] | INTRAMUSCULAR | Status: DC | PRN

## 2020-12-07 MED ORDER — FENTANYL 2500MCG IN NS 250ML (10MCG/ML) PREMIX INFUSION
0.0000 ug/h | INTRAVENOUS | Status: DC
Start: 2020-12-07 — End: 2020-12-12
  Administered 2020-12-07: 25 ug/h via INTRAVENOUS
  Administered 2020-12-08: 65 ug/h via INTRAVENOUS
  Administered 2020-12-09: 95 ug/h via INTRAVENOUS
  Administered 2020-12-10: 120 ug/h via INTRAVENOUS
  Administered 2020-12-11: 125 ug/h via INTRAVENOUS
  Administered 2020-12-12: 175 ug/h via INTRAVENOUS
  Filled 2020-12-07 (×6): qty 250

## 2020-12-07 MED ORDER — NOREPINEPHRINE 4 MG/250ML-% IV SOLN
INTRAVENOUS | Status: AC
  Administered 2020-12-07: 2 ug/min via INTRAVENOUS
  Filled 2020-12-07: qty 250

## 2020-12-07 MED ORDER — SODIUM CHLORIDE 0.9 % IV SOLN: 250.0000 mL | INTRAVENOUS | Status: DC

## 2020-12-07 MED ORDER — SODIUM CHLORIDE 0.9% IV SOLUTION: Freq: Once | INTRAVENOUS | Status: AC

## 2020-12-07 MED ORDER — INSULIN ASPART 100 UNIT/ML IJ SOLN
1.0000 [IU] | INTRAMUSCULAR | Status: DC
  Administered 2020-12-07: 1 [IU] via SUBCUTANEOUS
  Administered 2020-12-07 (×4): 2 [IU] via SUBCUTANEOUS
  Administered 2020-12-08: 1 [IU] via SUBCUTANEOUS
  Administered 2020-12-08: 2 [IU] via SUBCUTANEOUS
  Administered 2020-12-08 – 2020-12-10 (×8): 1 [IU] via SUBCUTANEOUS
  Administered 2020-12-10: 2 [IU] via SUBCUTANEOUS
  Administered 2020-12-10: 1 [IU] via SUBCUTANEOUS
  Administered 2020-12-11: 2 [IU] via SUBCUTANEOUS
  Administered 2020-12-11 (×2): 3 [IU] via SUBCUTANEOUS
  Administered 2020-12-11 (×2): 2 [IU] via SUBCUTANEOUS
  Administered 2020-12-11: 1 [IU] via SUBCUTANEOUS
  Administered 2020-12-12: 2 [IU] via SUBCUTANEOUS
  Administered 2020-12-12: 3 [IU] via SUBCUTANEOUS
  Administered 2020-12-12: 2 [IU] via SUBCUTANEOUS
  Administered 2020-12-12: 3 [IU] via SUBCUTANEOUS

## 2020-12-07 MED ORDER — PHENYLEPHRINE HCL-NACL 10-0.9 MG/250ML-% IV SOLN
0.0000 ug/min | INTRAVENOUS | Status: DC
Start: 2020-12-07 — End: 2020-12-08
  Administered 2020-12-07 (×2): 35 ug/min via INTRAVENOUS
  Administered 2020-12-07: 45 ug/min via INTRAVENOUS
  Administered 2020-12-07: 35 ug/min via INTRAVENOUS
  Administered 2020-12-08: 65 ug/min via INTRAVENOUS
  Administered 2020-12-08: 75 ug/min via INTRAVENOUS
  Administered 2020-12-08: 55 ug/min via INTRAVENOUS
  Administered 2020-12-08: 115 ug/min via INTRAVENOUS
  Filled 2020-12-07 (×7): qty 250

## 2020-12-07 MED ORDER — METRONIDAZOLE 500 MG/100ML IV SOLN: 500.0000 mg | Freq: Three times a day (TID) | INTRAVENOUS | Status: DC

## 2020-12-07 MED ORDER — METRONIDAZOLE 500 MG/100ML IV SOLN
500.0000 mg | Freq: Two times a day (BID) | INTRAVENOUS | Status: DC
  Administered 2020-12-07 – 2020-12-13 (×14): 500 mg via INTRAVENOUS
  Filled 2020-12-07 (×15): qty 100

## 2020-12-07 MED ORDER — SODIUM CHLORIDE 0.9 % IV SOLN
500.0000 [IU]/h | INTRAVENOUS | Status: DC
  Administered 2020-12-07 – 2020-12-12 (×7): 500 [IU]/h via INTRAVENOUS_CENTRAL
  Filled 2020-12-07 (×7): qty 10000

## 2020-12-07 MED ORDER — SODIUM BICARBONATE 8.4 % IV SOLN
INTRAVENOUS | Status: AC
  Administered 2020-12-07: 50 meq via INTRAVENOUS
  Filled 2020-12-07: qty 50

## 2020-12-07 MED ORDER — HEPARIN SODIUM (PORCINE) 1000 UNIT/ML DIALYSIS
1000.0000 [IU] | INTRAMUSCULAR | Status: DC | PRN
  Administered 2020-12-07 – 2020-12-08 (×2): 3200 [IU] via INTRAVENOUS_CENTRAL
  Filled 2020-12-07 (×3): qty 6

## 2020-12-07 MED ORDER — NOREPINEPHRINE 4 MG/250ML-% IV SOLN
2.0000 ug/min | INTRAVENOUS | Status: DC
Start: 2020-12-07 — End: 2020-12-07

## 2020-12-07 MED ORDER — SODIUM CHLORIDE 0.9 % FOR CRRT
INTRAVENOUS_CENTRAL | Status: DC | PRN
  Administered 2020-12-07 – 2020-12-08 (×2): 2000 mL via INTRAVENOUS_CENTRAL

## 2020-12-07 MED ORDER — ORAL CARE MOUTH RINSE
15.0000 mL | OROMUCOSAL | Status: DC
  Administered 2020-12-07 – 2020-12-18 (×116): 15 mL via OROMUCOSAL

## 2020-12-07 MED ORDER — PRISMASOL BGK 4/2.5 32-4-2.5 MEQ/L REPLACEMENT SOLN: Status: DC

## 2020-12-07 MED ORDER — STERILE WATER FOR INJECTION IJ SOLN
INTRAMUSCULAR | Status: AC
  Filled 2020-12-07: qty 10

## 2020-12-07 MED ORDER — MIDAZOLAM 50MG/50ML (1MG/ML) PREMIX INFUSION
1.0000 mg/h | INTRAVENOUS | Status: DC
  Administered 2020-12-07: 0.5 mg/h via INTRAVENOUS
  Administered 2020-12-08: 1 mg/h via INTRAVENOUS
  Administered 2020-12-10: 1.5 mg/h via INTRAVENOUS
  Filled 2020-12-07 (×3): qty 50

## 2020-12-07 MED ORDER — OPIUM 10 MG/ML (1%) PO TINC
0.6000 mL | Freq: Four times a day (QID) | ORAL | Status: DC
Start: 2020-12-07 — End: 2020-12-08
  Filled 2020-12-07 (×2): qty 1

## 2020-12-07 MED ORDER — SODIUM BICARBONATE 8.4 % IV SOLN: 50.0000 meq | Freq: Once | INTRAVENOUS | Status: AC

## 2020-12-07 MED ORDER — PHENYLEPHRINE HCL-NACL 10-0.9 MG/250ML-% IV SOLN
25.0000 ug/min | INTRAVENOUS | Status: DC
  Administered 2020-12-07: 25 ug/min via INTRAVENOUS

## 2020-12-07 MED ORDER — STERILE WATER FOR INJECTION IV SOLN
INTRAVENOUS | Status: DC
  Filled 2020-12-07: qty 150

## 2020-12-07 MED ORDER — LACTATED RINGERS IV BOLUS
1000.0000 mL | Freq: Once | INTRAVENOUS | Status: AC
  Administered 2020-12-07: 1000 mL via INTRAVENOUS

## 2020-12-07 MED ORDER — SODIUM CHLORIDE 0.9 % IV SOLN
2.0000 g | INTRAVENOUS | Status: DC
  Administered 2020-12-07: 2 g via INTRAVENOUS
  Filled 2020-12-07: qty 2

## 2020-12-07 MED ORDER — FENTANYL BOLUS VIA INFUSION
25.0000 ug | INTRAVENOUS | Status: DC | PRN
  Administered 2020-12-07: 25 ug via INTRAVENOUS
  Administered 2020-12-08 – 2020-12-12 (×22): 50 ug via INTRAVENOUS
  Filled 2020-12-07: qty 50

## 2020-12-07 MED ORDER — SODIUM CHLORIDE 0.9 % IV SOLN
2.0000 g | Freq: Two times a day (BID) | INTRAVENOUS | Status: DC
  Administered 2020-12-08 – 2020-12-14 (×13): 2 g via INTRAVENOUS
  Filled 2020-12-07 (×13): qty 2

## 2020-12-07 MED ORDER — DEXTROSE 50 % IV SOLN
1.0000 | Freq: Once | INTRAVENOUS | Status: AC
  Administered 2020-12-07: 50 mL via INTRAVENOUS
  Filled 2020-12-07: qty 50

## 2020-12-07 MED ORDER — ACETYLCYSTEINE 20 % IN SOLN
2.0000 mL | Freq: Four times a day (QID) | RESPIRATORY_TRACT | Status: DC
  Administered 2020-12-07 – 2020-12-09 (×9): 2 mL via RESPIRATORY_TRACT
  Filled 2020-12-07 (×7): qty 4

## 2020-12-07 MED ORDER — VITAL HIGH PROTEIN PO LIQD: 1000.0000 mL | ORAL | Status: DC

## 2020-12-07 MED ORDER — PRISMASOL BGK 4/2.5 32-4-2.5 MEQ/L EC SOLN: Status: DC

## 2020-12-07 MED ORDER — INSULIN ASPART 100 UNIT/ML IV SOLN
5.0000 [IU] | Freq: Once | INTRAVENOUS | Status: AC
  Administered 2020-12-07: 5 [IU] via INTRAVENOUS

## 2020-12-07 MED ORDER — CALCIUM GLUCONATE-NACL 1-0.675 GM/50ML-% IV SOLN
1.0000 g | Freq: Once | INTRAVENOUS | Status: AC
  Administered 2020-12-07: 1000 mg via INTRAVENOUS
  Filled 2020-12-07: qty 50

## 2020-12-07 MED ORDER — SODIUM BICARBONATE 8.4 % IV SOLN
50.0000 meq | Freq: Once | INTRAVENOUS | Status: AC
  Administered 2020-12-07: 50 meq via INTRAVENOUS
  Filled 2020-12-07: qty 50

## 2020-12-07 MED ORDER — SODIUM BICARBONATE 8.4 % IV SOLN: INTRAVENOUS | Status: DC

## 2020-12-07 MED ORDER — ALTEPLASE 2 MG IJ SOLR: 2.0000 mg | Freq: Once | INTRAMUSCULAR | Status: DC | PRN

## 2020-12-07 MED ORDER — CHLORHEXIDINE GLUCONATE 0.12% ORAL RINSE (MEDLINE KIT)
15.0000 mL | Freq: Two times a day (BID) | OROMUCOSAL | Status: DC
Start: 2020-12-07 — End: 2021-02-14
  Administered 2020-12-07 – 2021-02-14 (×134): 15 mL via OROMUCOSAL

## 2020-12-07 MED ORDER — ALBUTEROL SULFATE (2.5 MG/3ML) 0.083% IN NEBU
2.5000 mg | INHALATION_SOLUTION | Freq: Four times a day (QID) | RESPIRATORY_TRACT | Status: DC
  Administered 2020-12-07 – 2020-12-21 (×55): 2.5 mg via RESPIRATORY_TRACT
  Filled 2020-12-07 (×55): qty 3

## 2020-12-07 NOTE — Progress Notes (Signed)
[facility] Progress Note  Subjective: was intubated on the vent now, creat up today, UOP 125 cc yesterday  Vitals:   12/07/20 0745 12/07/20 0800 12/07/20 0815 12/07/20 0837  BP: (!) 96/51 (!) 100/48 (!) 87/50   Pulse:      Resp: (!) 26 (!) 26 (!) 28   Temp:    99 F (37.2 C)  TempSrc:    Axillary  SpO2:  97%    Weight:      Height:        Exam: Gen on vent, sedated No rash, cyanosis or gangrene Sclera anicteric, throat w/ ETT No jvd or bruits Chest clear anterior/ lateral RRR no MRG Abd soft ntnd no mass or ascites, RLQ colostomy GU foley in place MS no joint effusions or deformity Ext 1+ pretib LE edema Neuro is on vent, sedated    Home meds include norvasc , lipitor, xeloda, neurontin, keppra, metfromin, prilosec, prns         Date                         Creat                           eGFR    4/28- 09/16/20            1.04- 1.67                    46- >60    5/25 - 10/08/20             4.22 >> 1.62                AKI, 15 > 60 ml/min    10/08/20                       1.34                             60    6/22                          1.56    11/15/20                       1.50                             52, IIIA    11/19/20                     1.83                             41    7/21.22                     1.60                             48, IIIA    7/24- 7/30                 1.73- 3.78                    17- 54  UA - 100 prot, o/w neg   UNa 82, UCr 65   13 L in and 11 L out =  +2 L     CXR's - w/o acute findings    ECHO 7/26 - EF 60- 65%, RV wnl, no valve issues    Renal US -10-12 cm kidneys w/o hydro   Assessment/ Plan: AKI on CKD 3A - b/l creat from early July 2022 is 1.5- 1.6, eGFR 48- 52. Creat 2.1 on admission, now up to 3.78 today. UOP dropping off. BP's have been soft the last several days which coincides w/ dropping UOP and rising creatinine. Suspect hypoperfusion causing hemodynamic AKI +/- ATN. He is not vol depleted. B/Cr  continue to worsen, agree w/ plans for CRRT. See orders.   SIRS/ asp PNA - was on cefepime x 4days, now is on unasyn IV Acute resp failure - on vent now Delerium Afib /RVR H/o colon Ca - stage IIIb, rx'd w/ surgery 09/2020, getting chemoRx sp 3 cycles. ONC suggesting getting palliative care involved.  DM2  Hx remote MCA stroke         Rob Yael Angerer 12/07/2020, 11:32 AM   Recent Labs  Lab 12/03/20 0251 12/04/20 0522 12/06/20 2119 12/07/20 0021 12/07/20 0301 12/07/20 0932  K 4.4   < > 5.4*  --  5.7* 5.1  BUN 48*   < > 88*  --  90* 89*  CREATININE 1.44*   < > 4.69*  --  5.20* 5.49*  CALCIUM 8.3*   < > 8.0*  --  7.7* 7.8*  PHOS 2.7  --   --  6.6*  --   --   HGB 9.2*   < > 8.8*  --   --  7.6*   < > = values in this interval not displayed.   Inpatient medications:  acetylcysteine  2 mL Nebulization QID   albuterol  2.5 mg Nebulization QID   chlorhexidine gluconate (MEDLINE KIT)  15 mL Mouth Rinse BID   Chlorhexidine Gluconate Cloth  6 each Topical Daily   heparin injection (subcutaneous)  5,000 Units Subcutaneous Q8H   hydrocerin   Topical BID   insulin aspart  1-3 Units Subcutaneous Q4H   mouth rinse  15 mL Mouth Rinse 10 times per day   OLANZapine  5 mg Intramuscular QHS   Opium  0.6 mL Oral Q6H    sodium chloride 250 mL (12/07/20 0956)   sodium chloride 10 mL/hr at 12/07/20 0700   sodium chloride     sodium chloride     amiodarone 30 mg/hr (12/07/20 0700)   ceFEPime (MAXIPIME) IV 2 g (12/07/20 1107)   chlorproMAZINE (THORAZINE) IV Stopped (12/03/20 0544)   famotidine (PEPCID) IV Stopped (12/07/20 0137)   levETIRAcetam 1,000 mg (12/07/20 1000)   metronidazole     phenylephrine (NEO-SYNEPHRINE) Adult infusion 35 mcg/min (12/07/20 0858)   promethazine (PHENERGAN) injection (IM or IVPB)     propofol (DIPRIVAN) infusion 15 mcg/kg/min (12/07/20 0700)    sodium bicarbonate (isotonic) infusion in sterile water 100 mL/hr at 12/07/20 0855   thiamine injection Stopped  (12/07/20 0013)   sodium chloride, sodium chloride, acetaminophen, chlorproMAZINE (THORAZINE) IV, dextrose, docusate, fentaNYL (SUBLIMAZE) injection, fentaNYL (SUBLIMAZE) injection, heparin, magic mouthwash, metoprolol tartrate, promethazine (PHENERGAN) injection (IM or IVPB), sodium chloride flush

## 2020-12-07 NOTE — Progress Notes (Signed)
eLink Physician-Brief Progress Note Patient Name: Thomas [name] DOB: 1955-11-05 MRN: [id number]   Date of Service  12/07/2020  HPI/Events of Note  Prophylactic heparin held for line placement earlier No report of bleeding  eICU Interventions  May resume prophylactic heparin as previously ordered as he is at high risk for VTE given malignancy and is now intubated     Intervention Category Intermediate Interventions: Best-practice therapies (e.g. DVT, beta blocker, etc.)  Judd Lien 12/07/2020, 10:57 PM

## 2020-12-07 NOTE — Progress Notes (Signed)
eLink Physician-Brief Progress Note Patient Name: SAHAN PEN DOB: 1956/03/03 MRN: [id number]   Date of Service  12/07/2020  HPI/Events of Note  Notified of desaturation episodes for the past 2 hours Poor wave form on monitor Ongoing 1st unit blood transfusion Calcium 7.2  eICU Interventions  Ordered ABG and CXR. Elink to be notified once done Corrected calcium 9, no replacement needed at this time     Intervention Category Major Interventions: Hypoxemia - evaluation and management  Judd Lien 12/07/2020, 9:16 PM

## 2020-12-07 NOTE — Procedures (Signed)
Intubation Procedure Note  Thomas [name]  [id number]  01/18/1956  Date:12/07/20  Time:5:50 AM   Provider Performing:Letrell Attwood Hardin Negus   Procedure: Intubation (31500)  Indication(s) Respiratory Failure  Consent Risks of the procedure as well as the alternatives and risks of each were explained to the patient and/or caregiver.  Consent for the procedure was obtained and is signed in the bedside chart   Anesthesia Etomidate and Rocuronium   Time Out Verified patient identification, verified procedure, site/side was marked, verified correct patient position, special equipment/implants available, medications/allergies/relevant history reviewed, required imaging and test results available.   Sterile Technique Usual hand hygeine, masks, and gloves were used   Procedure Description Patient positioned in bed supine.  Sedation given as noted above.  Patient was intubated with endotracheal tube using Glidescope.  View was Grade 1 full glottis .  Number of attempts was 1.  Colorimetric CO2 detector was consistent with tracheal placement.   Complications/Tolerance None; patient tolerated the procedure well. Chest X-ray is ordered to verify placement.   EBL Minimal   Specimen(s) None

## 2020-12-07 NOTE — Progress Notes (Signed)
PHARMACY NOTE:  ANTIMICROBIAL RENAL DOSAGE ADJUSTMENT  Current antimicrobial regimen includes a mismatch between antimicrobial dosage and estimated renal function.  As per policy approved by the Pharmacy & Therapeutics and Medical Executive Committees, the antimicrobial dosage will be adjusted accordingly.  Current antimicrobial dosage:  Cefepime 2g IV q24  Indication: HAP  Renal Function: to start CRRT  Estimated Creatinine Clearance: 16.9 mL/min (A) (by C-G formula based on SCr of 5.49 mg/dL (H)). []      On intermittent HD, scheduled: []      On CRRT    Antimicrobial dosage has been changed to:  Cefepime 2g IV q12 for CRRT dosing  Additional comments:   Thank you for allowing pharmacy to be a part of this patient's care.  Kara Mead, [facility] 12/07/2020 11:59 AM

## 2020-12-07 NOTE — Progress Notes (Addendum)
Addendum -- RN informed RD that surgery now wants to hold off on TF due to risk of bowel ischemia. Will follow-up tomorrow, 8/1.   Brief Nutrition Note  Consult received for enteral/tube feeding initiation and management.  Adult Enteral Nutrition Protocol initiated. Full assessment to follow.  Admitting Dx: Abdominal pain [R10.9] 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 ([place]) [E11.00]  Body mass index is 39.22 kg/m. Pt meets criteria for obesity based on current BMI.  Labs: Recent Labs  Lab 12/03/20 0251 12/04/20 0522 12/06/20 2119 12/07/20 0021 12/07/20 0301 12/07/20 0932 12/07/20 1744  NA 139   < > 138  --  135 137 139  K 4.4   < > 5.4*  --  5.7* 5.1 4.8  CL 112*   < > 109  --  109 107 109  CO2 20*   < > 14*  --  13* 14* 15*  BUN 48*   < > 88*  --  90* 89* 88*  CREATININE 1.44*   < > 4.69*  --  5.20* 5.49* 4.91*  CALCIUM 8.3*   < > 8.0*  --  7.7* 7.8* 7.2*  MG 2.3  --  2.4 2.3  --   --  2.3  PHOS 2.7  --   --  6.6*  --   --  5.1*  5.0*  GLUCOSE 217*   < > 170*  --  203* 193* 166*   < > = values in this interval not displayed.    Larkin Ina, MS, RD, LDN (she/her/hers) RD pager number and weekend/on-call pager number located in [place].

## 2020-12-07 NOTE — Progress Notes (Signed)
Pharmacy Antibiotic Note  Thomas [name] is a 65 y.o. male admitted on 11/30/2020 with HAP, possible aspiration.  Pharmacy has been consulted for cefepime dosing.  Plan: Cefepime 2g IV q24 per current renal function Change Flagyl to 500mg  IV q12 per current recommendations q8 vs q12 dosing  Height: 5\' 8"  (172.7 cm) Weight: 117 kg (257 lb 15 oz) IBW/kg (Calculated) : 68.4  Temp (24hrs), Avg:99 F (37.2 C), Min:98.2 F (36.8 C), Max:99.7 F (37.6 C)  Recent Labs  Lab 12/01/20 0228 12/01/20 0432 12/01/20 1226 12/02/20 0623 12/03/20 0251 12/04/20 0522 12/05/20 0442 12/06/20 0437 12/06/20 2119 12/07/20 0021 12/07/20 0301  WBC  --  4.1   < >  --  5.2 4.2 4.4 4.7 6.1  --   --   CREATININE  --  2.12*   < >  --  1.44* 1.49* 2.60* 3.78* 4.69*  --  5.20*  LATICACIDVEN 5.4* 4.3*  --  1.8  --   --   --   --  3.7* 3.6*  --    < > = values in this interval not displayed.    Estimated Creatinine Clearance: 17.8 mL/min (A) (by C-G formula based on SCr of 5.2 mg/dL (H)).    No Known Allergies   Thank you for allowing pharmacy to be a part of this patient's care.  Kara Mead 12/07/2020 8:47 AM

## 2020-12-07 NOTE — Progress Notes (Signed)
RT attempted Radial Arterial line insertion X's 2 without success.  MD/NP aware.

## 2020-12-07 NOTE — Progress Notes (Signed)
Subjective: Unfortunately the patient decompensated last night and he required intubation.  He is currently sedated and intubated.  Objective: Vital signs in last 24 hours: Temp:  [98.2 F (36.8 C)-99.7 F (37.6 C)] 99 F (37.2 C) (07/31 0837) Pulse Rate:  [79-149] 93 (07/31 0730) Resp:  [15-35] 28 (07/31 0815) BP: (63-182)/(31-114) 87/50 (07/31 0815) SpO2:  [85 %-100 %] 97 % (07/31 0800) FiO2 (%):  [50 %-100 %] 100 % (07/31 0800) Weight:  [257 lb 15 oz (117 kg)] 257 lb 15 oz (117 kg) (07/31 0500)  Intake/Output from previous day: 07/30 0701 - 07/31 0700 In: 2439.4 [I.V.:738.5; IV Piggyback:1700.9] Out: 1905 [Urine:205; ATFTD:3220] Intake/Output this shift: No intake/output data recorded.  Lab Results:  Recent Labs    12/06/20 2119 12/07/20 0932  WBC 6.1 7.5  HGB 8.8* 7.6*  HCT 25.6* 21.5*  PLT 295 242   BMET Recent Labs    12/07/20 0301 12/07/20 0932  NA 135 137  K 5.7* 5.1  CL 109 107  CO2 13* 14*  GLUCOSE 203* 193*  BUN 90* 89*  CREATININE 5.20* 5.49*  CALCIUM 7.7* 7.8*    Studies/Results: DG Chest 1 View  Result Date: 12/06/2020 CLINICAL DATA:  Shortness of breath. Patient undergoing chemotherapy for stage IIIB colon cancer. EXAM: CHEST  1 VIEW COMPARISON:  December 04, 2020 FINDINGS: Platelike opacity in the left base consistent with atelectasis or scar, stable. New opacity in the right base obscuring the elevated right hemidiaphragm. The heart, hila, and mediastinum are unchanged and unremarkable. The right Port-A-Cath is stable. No pulmonary nodules or masses. No other abnormalities. IMPRESSION: 1. Stable platelike opacity in the left base consistent with scar or atelectasis. 2. New opacity in the right base obscuring the elevated right hemidiaphragm. The opacity could represent atelectasis versus developing infiltrate. Recommend clinical correlation and short-term follow-up imaging to ensure resolution. Electronically Signed   By: Dorise Bullion III M.D   On:  12/06/2020 11:50   DG Abd 1 View  Result Date: 12/06/2020 CLINICAL DATA:  NG tube placement. EXAM: ABDOMEN - 1 VIEW COMPARISON:  CT 12/01/2020 FINDINGS: Tip and side port of the enteric tube below the diaphragm in the stomach. Generalized paucity of bowel gas in the upper abdomen. IMPRESSION: Tip and side port of the enteric tube below the diaphragm in the stomach. Electronically Signed   By: Keith Rake M.D.   On: 12/06/2020 23:37   CT HEAD WO CONTRAST  Result Date: 12/05/2020 CLINICAL DATA:  Acute neuro deficit, agitation and delirium, history of colon carcinoma EXAM: CT HEAD WITHOUT CONTRAST TECHNIQUE: Contiguous axial images were obtained from the base of the skull through the vertex without intravenous contrast. COMPARISON:  09/08/2016 and previous FINDINGS: Brain: Previous left frontal craniotomy. Stable encephalomalacia in left frontal lobe. Negative for acute intracranial hemorrhage, midline shift, no parenchymal edema, mass or mass effect. No hydrocephalus. Mild diffuse parenchymal atrophy as before. Vascular: Atherosclerotic and physiologic intracranial calcifications. Skull: Changes of left temporal craniotomy. No acute fracture or worrisome bone lesion. Sinuses/Orbits: No acute finding. Other: None IMPRESSION: 1. Negative for bleed or other acute intracranial process. 2. Stable changes of left frontal craniotomy Electronically Signed   By: Lucrezia Europe M.D.   On: 12/05/2020 11:34   US RENAL  Result Date: 12/06/2020 CLINICAL DATA:  AK I EXAM: RENAL / URINARY TRACT ULTRASOUND COMPLETE COMPARISON:  Renal ultrasound 10/02/2020 FINDINGS: Right Kidney: Renal measurements: 9.8 x 6.3 x 5.9 cm = volume: 190 mL. Echogenicity within normal limits. No mass  or hydronephrosis visualized. Left Kidney: Renal measurements: 11.9 x 7.2 x 5.8 cm = volume: 262 mL. Echogenicity within normal limits. No hydronephrosis. There is a cyst in the lateral aspect of the kidney measuring 4.5 x 4.0 x 4.0 cm. Bladder:  Appears decompressed with Foley in place. Other: None. IMPRESSION: No acute abnormality sonographically in the kidneys. Exam is limited by difficulty with patient positioning and shadowing bowel gas. Electronically Signed   By: Audie Pinto M.D.   On: 12/06/2020 11:47   DG CHEST PORT 1 VIEW  Result Date: 12/06/2020 CLINICAL DATA:  Intubation. EXAM: PORTABLE CHEST 1 VIEW COMPARISON:  Radiograph earlier today.  CT 12/01/2020 FINDINGS: Endotracheal tube tip 19 mm from the carina. Enteric tube in place with tip and side-port below the diaphragm. Right chest port unchanged in position. There is progressive volume loss in the right hemithorax with opacification of the lower thorax and rightward mediastinal shift. Improved linear opacity at the left lung base. No pneumothorax. IMPRESSION: 1. Endotracheal tube tip 19 mm from the carina. Enteric tube in place. 2. Progressive volume loss in the right hemithorax with opacification of the lower thorax and rightward mediastinal shift. Findings favor lobar collapse/atelectasis. Linear opacity at the left lung base is improved from earlier today. Electronically Signed   By: Keith Rake M.D.   On: 12/06/2020 23:40   EEG adult  Result Date: 12/05/2020 Lora Havens, MD     12/05/2020  6:19 PM Patient Name: Thomas [name] MRN: [id number] Epilepsy Attending: Lora Havens Referring Physician/Provider: Dr Sander Radon Date: 12/05/2020 Duration: 23.18 mins Patient history: 65yo M withh/o epilepsy presented with ams. EEG to evaluate for seizure Level of alertness: Awake AEDs during EEG study: LEV Technical aspects: This EEG study was done with scalp electrodes positioned according to the 10-20 International system of electrode placement. Electrical activity was acquired at a sampling rate of 500Hz  and reviewed with a high frequency filter of 70Hz  and a low frequency filter of 1Hz . EEG data were recorded continuously and digitally stored. Description: No posterior  dominant rhythm was seen. EEG showed continuous generalized 3 to 6 Hz theta-delta slowing. There is also 3-5hz  sharply contoured theta-delta slowing with overriding 13-15hz  beta activity in left frontotemporal region consistent with breach artifact.  Hyperventilation and photic stimulation were not performed.   ABNORMALITY - Continuous slow, generalized - Breach artifact, left frontotemporal region IMPRESSION: This study is suggestive of cortical dysfunction arising from left frontotemporal region likely secondary to underlying encephalomalacia as well as prior craniotomy. There is also moderate diffuse encephalopathy, nonspecific etiology. No seizures or definite epileptiform discharges were seen throughout the recording. Priyanka Barbra Sarks    Medications: I have reviewed the patient's current medications.   Assessment/Plan: This is a very pleasant 65 years old African-American male with a stage IIIb (T3, N1, M0): Adenocarcinoma involving the transverse colon status post partial transverse colectomy in May 2022 status post adjuvant systemic chemotherapy with CAPOX status post 3 cycles last cycle was giving November 19, 2020 with a reduced dose capecitabine and oxaliplatin.  The patient has a lot of complication from this treatment and he was admitted with hypoglycemia, dehydration and diarrhea as well as hand-foot syndrome. Unfortunately the patient has decompensation with worsening respiratory failure last night.  He was intubated and currently sedated. I agree with holding the Lomotil, Imodium and tincture of opium for now. Will continue with the supportive care for his respiratory failure by the critical care team. Dr. Benay Spice will see the patient  tomorrow and discussed with the family consideration of palliative care.  Thank you for taking good care of Mr. Mallie Mussel.  We will continue to follow-up the patient with you and assist in his management.  LOS: 6 days    Eilleen Kempf 12/07/2020

## 2020-12-07 NOTE — Progress Notes (Signed)
Pt with increased work of breathing, decreased mentation, increased agitation and restlessness.  Covering NP made aware, new labs drawn, Elink consulted by NP.  Elink MD at bedside, see MD note. Pt sedated appropriately, see MAR.  MD intubated pt.  RN will continue to closely monitor.

## 2020-12-07 NOTE — Procedures (Addendum)
Central Venous Catheter Insertion Procedure Note  Thomas [name]  [id number]  05/06/1956  Date:12/07/20  Time:9:22 AM   Provider Performing:Pete E Kary Kos   Procedure: Insertion of Non-tunneled Central Venous Catheter(36556)with US guidance (40981)    Indication(s) Hemodialysis  Consent Unable to obtain consent due to emergent nature of procedure.  Anesthesia Topical only with 1% lidocaine   Timeout Verified patient identification, verified procedure, site/side was marked, verified correct patient position, special equipment/implants available, medications/allergies/relevant history reviewed, required imaging and test results available.  Sterile Technique Maximal sterile technique including full sterile barrier drape, hand hygiene, sterile gown, sterile gloves, mask, hair covering, sterile ultrasound probe cover (if used).  Procedure Description Area of catheter insertion was cleaned with chlorhexidine and draped in sterile fashion.   With real-time ultrasound guidance a HD catheter was placed into the left femoral vein.  Nonpulsatile blood flow and easy flushing noted in all ports.  The catheter was sutured in place and sterile dressing applied.  Complications/Tolerance None; patient tolerated the procedure well. Chest X-ray is ordered to verify placement for internal jugular or subclavian cannulation.  Chest x-ray is not ordered for femoral cannulation.  EBL Minimal  Specimen(s) None  [name] ACNP-BC [place] Pager # [phone] OR # [phone] if no answer  I directly directly supervised and observed this procedure - no complications apparent    [name], MD Pulmonary and [place] [phone] After 5:30 PM or weekends, use [facility] [phone]

## 2020-12-07 NOTE — Progress Notes (Signed)
General Surgery  Patient intubated overnight for worsening hypoxic respiratory failure. Now in shock on neo. Worsening renal failure. Colostomy output remains high and nonbloody. Antimotility agents (lomotil and imodium) should be held in the setting of hypotension with pressor requirement. Would also recommend holding on tube feeds for now while on pressors to minimize risk of bowel ischemia. Tube feeds will also increase ostomy output. No other acute surgical issues at this time.  Michaelle Birks, MD [facility], Hepatobiliary and Pancreatic Surgery 12/07/20 10:19 AM

## 2020-12-07 NOTE — Progress Notes (Addendum)
eLink Physician-Brief Progress Note Patient Name: Thomas [name] DOB: Mar 14, 1956 MRN: [id number]   Date of Service  12/07/2020  HPI/Events of Note  Intubated patient has tube feeds ordered but NG output is high. Was on bipap earlier. Also glucose in 200s.   eICU Interventions  Hold feeds NG to intermittent suction Q4 SSI and glucose checks      Intervention Category Major Interventions: Respiratory failure - evaluation and management  [name] 12/07/2020, 2:32 AM  4:45 am addendum Notified of BMP and VBG O2 sat is 95, lower the Fio2 to 90 and continue to wean to keep O2 sat > 90 Noted K , creatinine and bicarb Current infusions are Propofol and amiodarone Continues to have high stool output, urine is only 120 cc since 7 pm Push 1 amp bicarb, start bicarb drip, insulin/d50 and calcium gluconate also ordered In addition, repeat labs at 7 am with ABG ordered CXR noted but we are weaning Fio2 - may require a bronch if unable to wean He may very well end up needing CRRT but will try above measure for now D/w RN and have also notified PCCM MD   6:30 am addendum Called now for hypotension SBP was in th 60s at one point and is now in 80s Has not been hypotensive before this Has bicarb drips, amio drip and propofol infusing Levophed ordered Bicarb drip has to be placed on hold due to lack of central access at this time, one amp bicarb to be pushed and repeat labs are ordered for 7 am Due to compatibility issues of the infusions, no choice but to hold bicarb drip for now In addition, RN also noted he desaturated so he is back on 100% fio2, but current o2 sat is 98 so it is being weaned He is likely going to need CRRT so might benefit from placement of a multi lumen HD cath  Notified Dr [employer] of events, who will also be discussing with bedside ICU team about placing lines Have d/w RN and at least for now, start levophed via peripheral line  6:50 am addendum Notified of HR  160 Seen on camera HR down to 95-110 RN notes on 2 mic/min levophed, HR jumped up  Ordered Neosynephrine  Current map is 60

## 2020-12-07 NOTE — Progress Notes (Signed)
NAME:  Thomas [name], MRN:  [id number], DOB:  Jul 10, 1955, LOS: 6 ADMISSION DATE:  11/30/2020, CONSULTATION DATE:  7/30 REFERRING MD:  Riccardo Dubin, CHIEF COMPLAINT:  Worsening hypoxia/dyspnea  History of Present Illness:  65 yo M admitted for worsening sepsis vs reaction to chemotherapy with high ostomy output and soft BP on 7/25. Has had issues during his hospitalization with recurrent aspirations and more recently worsening respiratory status.  Most recently pt developed worsening hypoxia and was requiring BIPAP w/ FIO2 of 100% and increased work of breathing. Unable to obtain ABG, CXR in AM of 7/30 showed some worsening concern of aspiration.   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  Hx of remote MCA stroke, Asthma hx as a child, epilepsy on keppra, GERD, T2DM on metformin as outpt, HTN, HLD, OSA  Significant Hospital Events: Including procedures, antibiotic start and stop dates in addition to other pertinent events   Transferred to [facility] 7/27, stopped abx as sepsis resolved/ruled out Worsened AME, more hypoxic with worsening renal funciton, new aspiration on 7/30, abx restarted Pt with increased o2 requirement on 100% FIO2 on BIPAP, d/w family who wanted to proceed with intubation, pt intubated 7/30 in PM.    Micro: Resp viral panel 7/24 > neg covid/ flu [facility]  7/24  x 2 neg  MRSA  7/25 neg  UC   7/25  neg  C diff screeen 7/26  neg ET culture 7/31 >>>  Abx: Unasyn 7/30 >>>7/31 Maxepime 7/31 >>> Flagyl 7/31 >>>  Scheduled Meds:  chlorhexidine gluconate (MEDLINE KIT)  15 mL Mouth Rinse BID   Chlorhexidine Gluconate Cloth  6 each Topical Daily   feeding supplement (PROSource TF)  45 mL Per Tube BID   feeding supplement (VITAL HIGH PROTEIN)  1,000 mL Per Tube Q24H   heparin injection (subcutaneous)  5,000 Units  Subcutaneous Q8H   hydrocerin   Topical BID   insulin aspart  1-3 Units Subcutaneous Q4H   mouth rinse  15 mL Mouth Rinse 10 times per day   OLANZapine  5 mg Intramuscular QHS   Opium  0.6 mL Oral Q6H   Continuous Infusions:  sodium chloride Stopped (12/04/20 1119)   sodium chloride 10 mL/hr at 12/07/20 0700   sodium chloride     sodium chloride     amiodarone 30 mg/hr (12/07/20 0700)   chlorproMAZINE (THORAZINE) IV Stopped (12/03/20 0544)   famotidine (PEPCID) IV Stopped (12/07/20 0137)   levETIRAcetam Stopped (12/06/20 2332)   metronidazole     phenylephrine (NEO-SYNEPHRINE) Adult infusion 25 mcg/min (12/07/20 0700)   promethazine (PHENERGAN) injection (IM or IVPB)     propofol (DIPRIVAN) infusion 15 mcg/kg/min (12/07/20 0700)    sodium bicarbonate (isotonic) infusion in sterile water Stopped (12/07/20 0629)   thiamine injection Stopped (12/07/20 0013)   PRN Meds:.sodium chloride, sodium chloride, acetaminophen, chlorproMAZINE (THORAZINE) IV, dextrose, docusate, fentaNYL (SUBLIMAZE) injection, fentaNYL (SUBLIMAZE) injection, magic mouthwash, metoprolol tartrate, promethazine (PHENERGAN) injection (IM or IVPB), sodium chloride flush    Interim History / Subjective:  [place] pm 7/30 required ET with RLL atx on cxr but no edema  Colostomy output very high, uop very low so ? True vol status   Objective   Blood pressure (!) 101/55, pulse 79, temperature 99 F (37.2 C), temperature source Oral, resp. rate (!) 23, height 5' 8"  (1.727 m), weight 117 kg, SpO2 97 %.  Vent Mode: PRVC FiO2 (%):  [50 %-100 %] 100 % Set Rate:  [15 bmp-24 bmp] 24 bmp Vt Set:  [540 mL] 540 mL PEEP:  [7 cmH20-10 cmH20] 10 cmH20 Pressure Support:  [7 cmH20] 7 cmH20 Plateau Pressure:  [19 cmH20-24 cmH20] 23 cmH20   Intake/Output Summary (Last 24 hours) at 12/07/2020 0758 Last data filed at 12/07/2020 0700 Gross per 24 hour  Intake 2439.43 ml  Output 1905 ml  Net 534.43 ml   Filed Weights   12/01/20  0825 12/06/20 0943 12/07/20 0500  Weight: 111.8 kg 116.6 kg 117 kg   Examination: Tmax  99.7  General appearance:    elderly black male, sedated on vent  Oropharynx et/og  Neck supple Lungs with a few scattered exp > insp rhonchi bilaterally/decreased bs R base IRIR pulse low 100's on neo / amio drips  Abd obese with limited excursion / 900 cc stool output last shift Extr warm with trace edema n- no clubbing noted Neuro  Sensorium sedated,  was moving all 4 to pain but not f/c prior to sedation    I personally reviewed images and agree with radiology impression as follows:  CXR:   7/30 pm  1. Endotracheal tube tip 19 mm from the carina. Enteric tube in place. 2. Progressive volume loss in the right hemithorax with opacification of the lower thorax and rightward mediastinal shift. Findings favor lobar collapse/atelectasis    Resolved Hospital Problem list       Assessment & Plan:  Pt is a 65 yo M w/ a hx of stage IIIb colon cancer s/p colectomy and colostomy May 2022 on chemo, remote MCA stroke, T2DM, HTN, HLD, questionable asthma hx, OSA, GERD, transferred to [facility] service pm 7/30  for worsening AHRF 2/2 aspiration PNA/ATX RLL in the setting of worsening AME.  #AHRF 2/2 Aspiration pneumonitis and RLL atx - pt noted to have significant amount of thick dried secretions in posterior oropharynx.  >>> changed to maxepime/flagyl for possible HCAP/aspiration PNA (and to cover gut) >>> RLL directly lined up with ET so needs vigorous suctioning and will add mucomyst x 48  >>> use vent to compensate for metabolic acidosis as long as don't induce air trapping autopeep which may compromise Renal blood flow   Circulatory shock/ neo dependent ? Vol status given that he has large amt stool output from colostomy and high PCT  >>> check cvp/ broaden abx 7/31 for hops/ gi sepsis for now  (see flow sheet above)    #Oliguric AKI on CKD2 w/ hyperkalemia, worsening non anion gap metabolic acidosis,  possibly ATN-   - Renal u/s 7/30 nl with FENA  > 1.0  - Nephrology follwing  - HD catheter today to get cvp and prepare for  CVVH if neede      #Atrial fibrillation with RVR -  Echocardiogram with preserved LF systolic function.   >>>  cont amiodarone and use neo as pressor of choice >>>  cont to hold apixaban for now   #Colon cancer, s/p colostomy with recent course of chemo c/b diarrhea. Recent dose of chemotherapy.   - Heme/onc following    #T2DM w/ hyperglycemia -   - cont ISS q4hrs and QHS   #Seizures - EEG previously with encephalopathy but no active seizures  - Cont IV keppra     #Obesity class 2/ moderate calorie protein malnutrition.   - TF, nutrition consult  #Stage 2 coccyx pressure ulcer (present on admission), BMI is 37. Moderate calorie protein  malnutrition.   - wound care  Best Practice (right click and "Reselect all SmartList Selections" daily)   Diet/type: tubefeeds DVT prophylaxis: prophylactic heparin  GI prophylaxis: H2B Lines: Port Foley:  Yes, and it is still needed Code Status:  full code Last date of goals of care discussion    Labs   CBC: Recent Labs  Lab 12/02/20 0308 12/03/20 0251 12/04/20 0522 12/05/20 0442 12/06/20 0437 12/06/20 2119  WBC 4.4 5.2 4.2 4.4 4.7 6.1  NEUTROABS 1.9  --  1.5* 1.8 2.0 3.9  HGB 10.2* 9.2* 9.7* 9.6* 8.6* 8.8*  HCT 28.6* 26.3* 27.8* 27.6* 25.2* 25.6*  MCV 74.7* 75.4* 76.6* 77.5* 78.8* 76.9*  PLT 179 201 253 264 245 947    Basic Metabolic Panel: Recent Labs  Lab 12/01/20 1549 12/02/20 0308 12/03/20 0251 12/04/20 0522 12/05/20 0442 12/06/20 0437 12/06/20 2119 12/07/20 0021 12/07/20 0301  NA  --  137 139 139 138 137 138  --  135  K  --  4.7 4.4 4.8 5.0 5.4* 5.4*  --  5.7*  CL  --  111 112* 112* 111 110 109  --  109  CO2  --  19* 20* 21* 17* 16* 14*  --  13*  GLUCOSE  --  250* 217* 218* 221* 179* 170*  --  203*  BUN  --  56* 48* 43* 55* 75* 88*  --  90*  CREATININE  --  1.96* 1.44* 1.49* 2.60*  3.78* 4.69*  --  5.20*  CALCIUM  --  8.2* 8.3* 8.0* 7.9* 7.7* 8.0*  --  7.7*  MG 2.1 1.9 2.3  --   --   --  2.4 2.3  --   PHOS  --   --  2.7  --   --   --   --  6.6*  --    GFR: Estimated Creatinine Clearance: 17.8 mL/min (A) (by C-G formula based on SCr of 5.2 mg/dL (H)). Recent Labs  Lab 12/01/20 0432 12/02/20 0308 12/02/20 0623 12/02/20 1220 12/03/20 0251 12/04/20 0522 12/05/20 0442 12/06/20 0437 12/06/20 2119 12/07/20 0021 12/07/20 0301  PROCALCITON  --   --   --  0.60 0.45  --   --   --  32.28  --  30.55  WBC 4.1   < >  --   --  5.2 4.2 4.4 4.7 6.1  --   --   LATICACIDVEN 4.3*  --  1.8  --   --   --   --   --  3.7* 3.6*  --    < > = values in this interval not displayed.    Liver Function Tests: Recent Labs  Lab 11/30/20 2205  AST 26  ALT 29  ALKPHOS 67  BILITOT 1.4*  PROT 6.8  ALBUMIN 3.3*   Recent Labs  Lab 12/01/20 0018  LIPASE 24   No results for input(s): AMMONIA in the last 168 hours.  ABG    Component Value Date/Time   PHART 7.323 (L) 12/07/2020 0710   PCO2ART 27.0 (L) 12/07/2020 0710   PO2ART 77.3 (L) 12/07/2020 0710   HCO3 13.6 (L) 12/07/2020 0710   TCO2 28 09/01/2016 1707   ACIDBASEDEF 10.7 (H) 12/07/2020 0710   O2SAT 93.7 12/07/2020 0710     Coagulation Profile: Recent Labs  Lab 11/30/20 2205  INR 1.5*    Cardiac Enzymes: No results for input(s): CKTOTAL, CKMB, CKMBINDEX, TROPONINI in the last 168 hours.  HbA1C: Hgb A1c MFr Bld  Date/Time  Value Ref Range Status  12/01/2020 04:32 AM 8.7 (H) 4.8 - 5.6 % Final    Comment:    (NOTE)         Prediabetes: 5.7 - 6.4         Diabetes: >6.4         Glycemic control for adults with diabetes: <7.0   10/02/2020 12:32 AM 7.4 (H) 4.8 - 5.6 % Final    Comment:    (NOTE)         Prediabetes: 5.7 - 6.4         Diabetes: >6.4         Glycemic control for adults with diabetes: <7.0     CBG: Recent Labs  Lab 12/06/20 1142 12/06/20 1622 12/06/20 1936 12/07/20 0121 12/07/20 0358   GLUCAP 139* 136* 157* 209* 198*       The patient is critically ill with multiple organ systems failure and requires high complexity decision making for assessment and support, frequent evaluation and titration of therapies, application of advanced monitoring technologies and extensive interpretation of multiple databases. Critical Care Time devoted to patient care services described in this note is 60 minutes.   [name], MD Pulmonary and [place] [phone]   After 7:00 pm call Elink  [phone]

## 2020-12-07 NOTE — Procedures (Addendum)
Arterial Catheter Insertion Procedure Note  Thomas [name]  [id number]  27-May-1955  Date:12/07/20  Time:9:23 AM    Provider Performing: Clementeen Graham    Procedure: Insertion of Arterial Line [phone]) with US guidance (01484)   Indication(s) Blood pressure monitoring and/or need for frequent ABGs  Consent Risks of the procedure as well as the alternatives and risks of each were explained to the patient and/or caregiver.  Consent for the procedure was obtained and is signed in the bedside chart  Anesthesia None   Time Out Verified patient identification, verified procedure, site/side was marked, verified correct patient position, special equipment/implants available, medications/allergies/relevant history reviewed, required imaging and test results available.   Sterile Technique Maximal sterile technique including full sterile barrier drape, hand hygiene, sterile gown, sterile gloves, mask, hair covering, sterile ultrasound probe cover (if used).   Procedure Description Area of catheter insertion was cleaned with chlorhexidine and draped in sterile fashion. With real-time ultrasound guidance an arterial catheter was placed into the left femoral artery.  Appropriate arterial tracings confirmed on monitor.     Complications/Tolerance None; patient tolerated the procedure well.   EBL Minimal   Specimen(s) None  [name] ACNP-BC [name] Pager # [phone] OR # [phone] if no answer  I directly directly supervised and observed this procedure - no complications apparent    [name], MD Pulmonary and [place] [phone] After 5:30 PM or weekends, use Beeper [phone]

## 2020-12-08 ENCOUNTER — Inpatient Hospital Stay (HOSPITAL_COMMUNITY): Payer: Medicare HMO

## 2020-12-08 DIAGNOSIS — E1100 Type 2 diabetes mellitus with hyperosmolarity without nonketotic hyperglycemic-hyperosmolar coma (NKHHC): Secondary | ICD-10-CM | POA: Diagnosis not present

## 2020-12-08 DIAGNOSIS — J9601 Acute respiratory failure with hypoxia: Secondary | ICD-10-CM | POA: Diagnosis not present

## 2020-12-08 LAB — RENAL FUNCTION PANEL
Albumin: 1.8 g/dL — ABNORMAL LOW (ref 3.5–5.0)
Albumin: 1.8 g/dL — ABNORMAL LOW (ref 3.5–5.0)
Anion gap: 17 — ABNORMAL HIGH (ref 5–15)
Anion gap: 12 (ref 5–15)
BUN: 55 mg/dL — ABNORMAL HIGH (ref 8–23)
BUN: 37 mg/dL — ABNORMAL HIGH (ref 8–23)
CO2: 16 mmol/L — ABNORMAL LOW (ref 22–32)
CO2: 19 mmol/L — ABNORMAL LOW (ref 22–32)
Calcium: 7.5 mg/dL — ABNORMAL LOW (ref 8.9–10.3)
Calcium: 7.2 mg/dL — ABNORMAL LOW (ref 8.9–10.3)
Chloride: 107 mmol/L (ref 98–111)
Chloride: 108 mmol/L (ref 98–111)
Creatinine, Ser: 3.24 mg/dL — ABNORMAL HIGH (ref 0.61–1.24)
Creatinine, Ser: 2.45 mg/dL — ABNORMAL HIGH (ref 0.61–1.24)
GFR, Estimated: 21 mL/min — ABNORMAL LOW (ref >60)
GFR, Estimated: 29 mL/min — ABNORMAL LOW (ref >60)
Glucose, Bld: 146 mg/dL — ABNORMAL HIGH (ref 70–99)
Glucose, Bld: 114 mg/dL — ABNORMAL HIGH (ref 70–99)
Phosphorus: 3.9 mg/dL (ref 2.5–4.6)
Phosphorus: 3.1 mg/dL (ref 2.5–4.6)
Potassium: 4.7 mmol/L (ref 3.5–5.1)
Potassium: 4.8 mmol/L (ref 3.5–5.1)
Sodium: 140 mmol/L (ref 135–145)
Sodium: 139 mmol/L (ref 135–145)

## 2020-12-08 LAB — GLUCOSE, CAPILLARY
Glucose-Capillary: 106 mg/dL — ABNORMAL HIGH (ref 70–99)
Glucose-Capillary: 120 mg/dL — ABNORMAL HIGH (ref 70–99)
Glucose-Capillary: 122 mg/dL — ABNORMAL HIGH (ref 70–99)
Glucose-Capillary: 139 mg/dL — ABNORMAL HIGH (ref 70–99)
Glucose-Capillary: 146 mg/dL — ABNORMAL HIGH (ref 70–99)
Glucose-Capillary: 150 mg/dL — ABNORMAL HIGH (ref 70–99)
Glucose-Capillary: 162 mg/dL — ABNORMAL HIGH (ref 70–99)

## 2020-12-08 LAB — HEPATIC FUNCTION PANEL
ALT: 23 U/L (ref 0–44)
AST: 43 U/L — ABNORMAL HIGH (ref 15–41)
Albumin: 1.8 g/dL — ABNORMAL LOW (ref 3.5–5.0)
Alkaline Phosphatase: 61 U/L (ref 38–126)
Bilirubin, Direct: 0.9 mg/dL — ABNORMAL HIGH (ref 0.0–0.2)
Indirect Bilirubin: 1.2 mg/dL — ABNORMAL HIGH (ref 0.3–0.9)
Total Bilirubin: 2.1 mg/dL — ABNORMAL HIGH (ref 0.3–1.2)
Total Protein: 5.4 g/dL — ABNORMAL LOW (ref 6.5–8.1)

## 2020-12-08 LAB — BPAM RBC
Blood Product Expiration Date: 202208212359
Blood Product Expiration Date: 202208212359
ISSUE DATE / TIME: 202207311847
ISSUE DATE / TIME: 202207312343
Unit Type and Rh: 7300
Unit Type and Rh: 7300

## 2020-12-08 LAB — BLOOD GAS, ARTERIAL
Acid-base deficit: 4.7 mmol/L — ABNORMAL HIGH (ref 0.0–2.0)
Bicarbonate: 17.5 mmol/L — ABNORMAL LOW (ref 20.0–28.0)
FIO2: 70
O2 Saturation: 95.4 %
Patient temperature: 98.6
pCO2 arterial: 24.7 mmHg — ABNORMAL LOW (ref 32.0–48.0)
pH, Arterial: 7.465 — ABNORMAL HIGH (ref 7.350–7.450)
pO2, Arterial: 83.8 mmHg (ref 83.0–108.0)

## 2020-12-08 LAB — LACTIC ACID, PLASMA
Lactic Acid, Venous: 4.7 mmol/L (ref 0.5–1.9)
Lactic Acid, Venous: 5.3 mmol/L (ref 0.5–1.9)

## 2020-12-08 LAB — TYPE AND SCREEN
ABO/RH(D): B POS
Antibody Screen: NEGATIVE
Unit division: 0
Unit division: 0

## 2020-12-08 LAB — PHOSPHORUS: Phosphorus: 4.2 mg/dL (ref 2.5–4.6)

## 2020-12-08 LAB — HEPARIN LEVEL (UNFRACTIONATED): Heparin Unfractionated: >1.1 IU/mL — ABNORMAL HIGH (ref 0.30–0.70)

## 2020-12-08 LAB — HEMOGLOBIN AND HEMATOCRIT, BLOOD
HCT: 28.9 % — ABNORMAL LOW (ref 39.0–52.0)
Hemoglobin: 10.4 g/dL — ABNORMAL LOW (ref 13.0–17.0)

## 2020-12-08 LAB — PROCALCITONIN: Procalcitonin: 25.09 ng/mL

## 2020-12-08 LAB — MAGNESIUM: Magnesium: 2.4 mg/dL (ref 1.7–2.4)

## 2020-12-08 LAB — APTT: aPTT: 67 seconds — ABNORMAL HIGH (ref 24–36)

## 2020-12-08 MED ORDER — SODIUM CHLORIDE 0.9 % IV SOLN
100.0000 mg | INTRAVENOUS | Status: DC
  Filled 2020-12-08: qty 100

## 2020-12-08 MED ORDER — SODIUM BICARBONATE 8.4 % IV SOLN
50.0000 meq | Freq: Once | INTRAVENOUS | Status: AC
  Administered 2020-12-08: 50 meq via INTRAVENOUS
  Filled 2020-12-08: qty 50

## 2020-12-08 MED ORDER — STERILE WATER FOR INJECTION IJ SOLN
INTRAMUSCULAR | Status: AC
  Administered 2020-12-08: 1 mL
  Filled 2020-12-08: qty 10

## 2020-12-08 MED ORDER — LACTATED RINGERS IV BOLUS
500.0000 mL | Freq: Once | INTRAVENOUS | Status: AC
  Administered 2020-12-08: 500 mL via INTRAVENOUS

## 2020-12-08 MED ORDER — VITAL HIGH PROTEIN PO LIQD
1000.0000 mL | ORAL | Status: DC
  Administered 2020-12-10 – 2020-12-11 (×2): 1000 mL

## 2020-12-08 MED ORDER — SODIUM CHLORIDE 0.9 % IV SOLN
1.2500 ng/kg/min | INTRAVENOUS | Status: DC
  Filled 2020-12-08 (×2): qty 1

## 2020-12-08 MED ORDER — PHENYLEPHRINE CONCENTRATED 100MG/250ML (0.4 MG/ML) INFUSION SIMPLE
0.0000 ug/min | INTRAVENOUS | Status: DC
  Administered 2020-12-08: 135 ug/min via INTRAVENOUS
  Filled 2020-12-08 (×2): qty 250

## 2020-12-08 MED ORDER — METOPROLOL TARTRATE 5 MG/5ML IV SOLN
2.5000 mg | Freq: Four times a day (QID) | INTRAVENOUS | Status: DC
  Administered 2020-12-08 – 2020-12-09 (×4): 2.5 mg via INTRAVENOUS
  Filled 2020-12-08 (×4): qty 5

## 2020-12-08 MED ORDER — VANCOMYCIN HCL 1750 MG/350ML IV SOLN
1750.0000 mg | INTRAVENOUS | Status: DC
  Filled 2020-12-08: qty 350

## 2020-12-08 MED ORDER — SODIUM CHLORIDE 0.9 % IV SOLN
200.0000 mg | Freq: Once | INTRAVENOUS | Status: AC
  Administered 2020-12-08: 200 mg via INTRAVENOUS
  Filled 2020-12-08: qty 200

## 2020-12-08 MED ORDER — VANCOMYCIN HCL 10 G IV SOLR
2500.0000 mg | Freq: Once | INTRAVENOUS | Status: AC
  Administered 2020-12-08: 2500 mg via INTRAVENOUS
  Filled 2020-12-08: qty 2500

## 2020-12-08 MED ORDER — IOHEXOL 9 MG/ML PO SOLN
ORAL | Status: AC
  Administered 2020-12-08: 500 mL
  Filled 2020-12-08: qty 1000

## 2020-12-08 MED ORDER — HYDROCORTISONE NA SUCCINATE PF 100 MG IJ SOLR
100.0000 mg | Freq: Three times a day (TID) | INTRAMUSCULAR | Status: DC
  Administered 2020-12-08 – 2020-12-13 (×15): 100 mg via INTRAVENOUS
  Filled 2020-12-08 (×15): qty 2

## 2020-12-08 MED ORDER — NOREPINEPHRINE 16 MG/250ML-% IV SOLN
0.0000 ug/min | INTRAVENOUS | Status: DC
  Administered 2020-12-08: 2 ug/min via INTRAVENOUS
  Administered 2020-12-09: 12 ug/min via INTRAVENOUS
  Filled 2020-12-08 (×2): qty 250

## 2020-12-08 MED ORDER — VASOPRESSIN 20 UNITS/100 ML INFUSION FOR SHOCK
0.0000 [IU]/min | INTRAVENOUS | Status: DC
Start: 2020-12-08 — End: 2020-12-11
  Filled 2020-12-08: qty 100

## 2020-12-08 MED ORDER — NOREPINEPHRINE 4 MG/250ML-% IV SOLN: 0.0000 ug/min | INTRAVENOUS | Status: DC

## 2020-12-08 MED ORDER — HEPARIN (PORCINE) 25000 UT/250ML-% IV SOLN
1400.0000 [IU]/h | INTRAVENOUS | Status: DC
  Administered 2020-12-08: 1400 [IU]/h via INTRAVENOUS
  Filled 2020-12-08: qty 250

## 2020-12-08 MED ORDER — IOHEXOL 9 MG/ML PO SOLN: 500.0000 mL | ORAL | Status: DC

## 2020-12-08 NOTE — Progress Notes (Signed)
Pharmacy Antibiotic Note  Thomas [name] is a 65 y.o. male admitted on 11/30/2020 with sepsis.  Pharmacy has been consulted for antibiotic dosing - currently on cefepime/flagyl - now adding vancomycin and eraxis.  Day #2 Cefepime/Flagyl - Tmax 99.5 - WBC 7.5 - Lactate 5.3, PCT 30.55 - Increasing pressor requirements  Plan: Continue Cefepime 2g IV q12h Continue Flagyl 500mg  IV q12h Begin Vancomycin 2.5g IV x 1, then 1750mg  IV q24h - plan to check trough at steady state Eraxis 200mg  IV x 1, then 100mg  IV x 24h Monitor renal function closely - currently on CRRT  Height: 5\' 8"  (172.7 cm) Weight: 118.6 kg (261 lb 7.5 oz) IBW/kg (Calculated) : 68.4  Temp (24hrs), Avg:98.3 F (36.8 C), Min:97.4 F (36.3 C), Max:99.5 F (37.5 C)  Recent Labs  Lab 12/02/20 0623 12/03/20 0251 12/04/20 0522 12/05/20 0442 12/06/20 0437 12/06/20 2119 12/07/20 0021 12/07/20 0301 12/07/20 0932 12/07/20 1744 12/08/20 0405 12/08/20 0812 12/08/20 1147  WBC  --    < > 4.2 4.4 4.7 6.1  --   --  7.5  --   --   --   --   CREATININE  --    < > 1.49* 2.60* 3.78* 4.69*  --  5.20* 5.49* 4.91* 3.24*  --   --   LATICACIDVEN 1.8  --   --   --   --  3.7* 3.6*  --   --   --   --  4.7* 5.3*   < > = values in this interval not displayed.    Estimated Creatinine Clearance: 28.8 mL/min (A) (by C-G formula based on SCr of 3.24 mg/dL (H)).    No Known Allergies  Antimicrobials this admission: 7/24 Cefepime >> 7/28, 7/31 >> 7/25 Vancomycin x1 in ED 7/30 Unasyn >> 7/31 7/31 Flagyl >>  8/1 Vancomycin >> 8/1 Eraxis >>   Dose adjustments this admission: 7/31: adjusted cefepime to 2g q12 for CRRT dosing   Microbiology results: 7/24 BCx: NGF 7/25 UCx:  ngf 7/25 MRSA PCR: neg 7/26 C diff: neg 7/31 Trach: abundant yeast, few GPR  Thank you for allowing pharmacy to be a part of this patient's care.  Peggyann Juba, PharmD, BCPS Pharmacy: [phone] 12/08/2020 2:02 PM

## 2020-12-08 NOTE — Progress Notes (Signed)
Patient was transported to CT on mechanical ventilation with no distress.

## 2020-12-08 NOTE — Progress Notes (Signed)
Progress Note     Subjective: Patient sedated on the vent. RN reports colostomy output is decreasing but patient also having large volume from rectum. Patient on CRRT. Pressor requirement has increased. Still in A. Fib on amio gtt.   Objective: Vital signs in last 24 hours: Temp:  [97.4 F (36.3 C)-99.3 F (37.4 C)] 98.1 F (36.7 C) (08/01 0800) Pulse Rate:  [26-174] 109 (08/01 0630) Resp:  [20-32] 26 (08/01 0630) BP: (85-135)/(40-113) 101/52 (08/01 0600) SpO2:  [83 %-100 %] 95 % (08/01 0730) Arterial Line BP: (84-150)/(38-70) 91/43 (08/01 0630) FiO2 (%):  [50 %-100 %] 70 % (08/01 0730) Weight:  [118.6 kg] 118.6 kg (08/01 0447) Last BM Date: 12/07/20  Intake/Output from previous day: 07/31 0701 - 08/01 0700 In: 4397 [I.V.:2888.2; Blood:758.8; IV Piggyback:750] Out: 2718.9 [Urine:607; Emesis/NG output:412.5; IPJAS:5053] Intake/Output this shift: Total I/O In: 168.2 [I.V.:168.2] Out: 62 [Urine:2; Stool:60]  PE: General: WD, obese male, who is intubated and sedated and appears critically ill  Heart: irregularly irregular with rate in the 120s.   Lungs: vent  Abd: soft, mild-moderate distention, midline surgical scar well healed, stoma pink and with liquid stool output, OGT clamped with bilious fluid present     Lab Results:  Recent Labs    12/06/20 2119 12/07/20 0932  WBC 6.1 7.5  HGB 8.8* 7.6*  HCT 25.6* 21.5*  PLT 295 242   BMET Recent Labs    12/07/20 1744 12/08/20 0405  NA 139 140  K 4.8 4.7  CL 109 107  CO2 15* 16*  GLUCOSE 166* 146*  BUN 88* 55*  CREATININE 4.91* 3.24*  CALCIUM 7.2* 7.5*   PT/INR No results for input(s): LABPROT, INR in the last 72 hours. CMP     Component Value Date/Time   NA 140 12/08/2020 0405   K 4.7 12/08/2020 0405   CL 107 12/08/2020 0405   CO2 16 (L) 12/08/2020 0405   GLUCOSE 146 (H) 12/08/2020 0405   BUN 55 (H) 12/08/2020 0405   CREATININE 3.24 (H) 12/08/2020 0405   CREATININE 1.83 (H) 11/19/2020 0914   CALCIUM  7.5 (L) 12/08/2020 0405   PROT 6.8 11/30/2020 2205   ALBUMIN 1.8 (L) 12/08/2020 0405   AST 26 11/30/2020 2205   AST 12 (L) 11/19/2020 0914   ALT 29 11/30/2020 2205   ALT 19 11/19/2020 0914   ALKPHOS 67 11/30/2020 2205   BILITOT 1.4 (H) 11/30/2020 2205   BILITOT 0.7 11/19/2020 0914   GFRNONAA 21 (L) 12/08/2020 0405   GFRNONAA 41 (L) 11/19/2020 0914   GFRAA >60 04/01/2017 1921   Lipase     Component Value Date/Time   LIPASE 24 12/01/2020 0018       Studies/Results: DG Chest 1 View  Result Date: 12/06/2020 CLINICAL DATA:  Shortness of breath. Patient undergoing chemotherapy for stage IIIB colon cancer. EXAM: CHEST  1 VIEW COMPARISON:  December 04, 2020 FINDINGS: Platelike opacity in the left base consistent with atelectasis or scar, stable. New opacity in the right base obscuring the elevated right hemidiaphragm. The heart, hila, and mediastinum are unchanged and unremarkable. The right Port-A-Cath is stable. No pulmonary nodules or masses. No other abnormalities. IMPRESSION: 1. Stable platelike opacity in the left base consistent with scar or atelectasis. 2. New opacity in the right base obscuring the elevated right hemidiaphragm. The opacity could represent atelectasis versus developing infiltrate. Recommend clinical correlation and short-term follow-up imaging to ensure resolution. Electronically Signed   By: Dorise Bullion III M.D   On:  12/06/2020 11:50   DG Abd 1 View  Result Date: 12/06/2020 CLINICAL DATA:  NG tube placement. EXAM: ABDOMEN - 1 VIEW COMPARISON:  CT 12/01/2020 FINDINGS: Tip and side port of the enteric tube below the diaphragm in the stomach. Generalized paucity of bowel gas in the upper abdomen. IMPRESSION: Tip and side port of the enteric tube below the diaphragm in the stomach. Electronically Signed   By: Keith Rake M.D.   On: 12/06/2020 23:37   US RENAL  Result Date: 12/06/2020 CLINICAL DATA:  AK I EXAM: RENAL / URINARY TRACT ULTRASOUND COMPLETE COMPARISON:   Renal ultrasound 10/02/2020 FINDINGS: Right Kidney: Renal measurements: 9.8 x 6.3 x 5.9 cm = volume: 190 mL. Echogenicity within normal limits. No mass or hydronephrosis visualized. Left Kidney: Renal measurements: 11.9 x 7.2 x 5.8 cm = volume: 262 mL. Echogenicity within normal limits. No hydronephrosis. There is a cyst in the lateral aspect of the kidney measuring 4.5 x 4.0 x 4.0 cm. Bladder: Appears decompressed with Foley in place. Other: None. IMPRESSION: No acute abnormality sonographically in the kidneys. Exam is limited by difficulty with patient positioning and shadowing bowel gas. Electronically Signed   By: Audie Pinto M.D.   On: 12/06/2020 11:47   DG Chest Port 1 View  Result Date: 12/07/2020 CLINICAL DATA:  Oxygen desaturation. EXAM: PORTABLE CHEST 1 VIEW COMPARISON:  Radiograph yesterday.  CT 12/01/2020 FINDINGS: Endotracheal tube tip 2 cm from the carina. Enteric tube tip below the diaphragm not included in the field of view. Right chest port remains in place. Improved aeration of the right lung base with decreased volume loss. Residual patchy basilar opacity. The heart is normal in size. No pulmonary edema, large pleural effusion or pneumothorax. IMPRESSION: 1. Improved aeration of the right lung base with decreased volume loss since radiograph yesterday. Residual patchy right basilar opacity. 2. Stable support apparatus. Electronically Signed   By: Keith Rake M.D.   On: 12/07/2020 22:12   DG CHEST PORT 1 VIEW  Result Date: 12/06/2020 CLINICAL DATA:  Intubation. EXAM: PORTABLE CHEST 1 VIEW COMPARISON:  Radiograph earlier today.  CT 12/01/2020 FINDINGS: Endotracheal tube tip 19 mm from the carina. Enteric tube in place with tip and side-port below the diaphragm. Right chest port unchanged in position. There is progressive volume loss in the right hemithorax with opacification of the lower thorax and rightward mediastinal shift. Improved linear opacity at the left lung base. No  pneumothorax. IMPRESSION: 1. Endotracheal tube tip 19 mm from the carina. Enteric tube in place. 2. Progressive volume loss in the right hemithorax with opacification of the lower thorax and rightward mediastinal shift. Findings favor lobar collapse/atelectasis. Linear opacity at the left lung base is improved from earlier today. Electronically Signed   By: Keith Rake M.D.   On: 12/06/2020 23:40    Anti-infectives: Anti-infectives (From admission, onward)    Start     Dose/Rate Route Frequency Ordered Stop   12/08/20 0000  ceFEPIme (MAXIPIME) 2 g in sodium chloride 0.9 % 100 mL IVPB        2 g 200 mL/hr over 30 Minutes Intravenous Every 12 hours 12/07/20 1200     12/07/20 1000  metroNIDAZOLE (FLAGYL) IVPB 500 mg        500 mg 100 mL/hr over 60 Minutes Intravenous Every 12 hours 12/07/20 0849     12/07/20 0900  ceFEPIme (MAXIPIME) 2 g in sodium chloride 0.9 % 100 mL IVPB  Status:  Discontinued  2 g 200 mL/hr over 30 Minutes Intravenous Every 24 hours 12/07/20 0809 12/07/20 1200   12/07/20 0830  metroNIDAZOLE (FLAGYL) IVPB 500 mg  Status:  Discontinued        500 mg 100 mL/hr over 60 Minutes Intravenous Every 8 hours 12/07/20 0758 12/07/20 0849   12/06/20 0900  Ampicillin-Sulbactam (UNASYN) 3 g in sodium chloride 0.9 % 100 mL IVPB  Status:  Discontinued        3 g 200 mL/hr over 30 Minutes Intravenous Every 12 hours 12/06/20 0832 12/07/20 0757   12/03/20 0800  ceFEPIme (MAXIPIME) 2 g in sodium chloride 0.9 % 100 mL IVPB  Status:  Discontinued        2 g 200 mL/hr over 30 Minutes Intravenous Every 8 hours 12/03/20 0746 12/04/20 1004   12/01/20 1000  ceFEPIme (MAXIPIME) 2 g in sodium chloride 0.9 % 100 mL IVPB  Status:  Discontinued        2 g 200 mL/hr over 30 Minutes Intravenous Every 12 hours 12/01/20 0513 12/03/20 0746   12/01/20 0300  vancomycin (VANCOREADY) IVPB 2000 mg/400 mL        2,000 mg 200 mL/hr over 120 Minutes Intravenous  Once 12/01/20 0255 12/01/20 0528    11/30/20 2245  ceFEPIme (MAXIPIME) 2 g in sodium chloride 0.9 % 100 mL IVPB        2 g 200 mL/hr over 30 Minutes Intravenous STAT 11/30/20 2237 12/01/20 0003        Assessment/Plan Stage IIIb colon cancer  S/p open partial colectomy with transverse colostomy 09/10/20 Dr. Bobbye Morton  - having high output from colostomy  - would not recommend anti-motility agents in setting of shock on pressors - would not recommend tube feeds in setting of shock with increasing pressor requirement and given that TF will also increase ostomy output - consider TPN for nutritional support if patient continued to require pressor support  - patient remains on tincture of opium - will confirm this is ok with MD but would not recommend this long term since it is very difficult to get in the outpatient setting - no acute surgical issues at this time, we are available as needed for questions   FEN: NPO, OGT - would not recommend TF at this time VTE: SQH, SCDs ID: [email or web address] 7/31>>  - below per primary team -  Sepsis vs reaction to chemotherapy with shock - increased neo this AM Acute hypoxic respiratory failure/aspiration pneumonitis - vent per PCCM AKI on CKD stage II - on CRRT A fib with RVR - on amio gtt Hx of MCA CVA Hx of asthma Hx of epilepsy on Keppra T2DM GERD HTN HLD OSA Obesity class II - BMI 39.76  LOS: 7 days    Norm Parcel, [facility] 12/08/2020, 8:28 AM Please see Amion for pager number during day hours 7:00am-4:30pm

## 2020-12-08 NOTE — Progress Notes (Signed)
     Update -   Palliative care likely meeting with family tomorrow 2. CT abd - some concern for ischemic colitis. Will hold off starting giaprezza but start vasopressin instead in addition to levophed     SIGNATURE    Dr. Brand Males, M.D., F.C.C.P,  Pulmonary and Critical Care Medicine Staff Physician, [place] Director - Interstitial Lung Disease  Program  Pulmonary [place] [facility] [place], [place], [address]  NPI Number:  NPI #[id number]  Pager: [phone], If no answer  -> Check AMION or Try [phone] Telephone (clinical office): [phone] Telephone (research): [phone]  6:02 PM 12/08/2020

## 2020-12-08 NOTE — Progress Notes (Addendum)
NAME:  Thomas [name], MRN:  [id number], DOB:  1956-02-26, LOS: 7 ADMISSION DATE:  11/30/2020, CONSULTATION DATE:  7/30 REFERRING MD:  Riccardo Dubin, CHIEF COMPLAINT:  Worsening hypoxia/dyspnea  History of Present Illness:  65 yo M admitted for worsening sepsis vs reaction to chemotherapy with high ostomy output and soft BP on 7/25. Has had issues during his hospitalization with recurrent aspirations and more recently worsening respiratory status.  Most recently pt developed worsening hypoxia and was requiring BIPAP w/ FIO2 of 100% and increased work of breathing. Unable to obtain ABG, CXR in AM of 7/30 showed some worsening concern of aspiration.   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  Hx of remote MCA stroke Asthma hx as a child Epilepsy on keppra GERD T2DM on metformin as outpt HTN HLD OSA  Significant Hospital Events:  7/27 Transferred to [facility], stopped abx as sepsis resolved/ruled out 7/30 AM Worsened AME, more hypoxic with worsening renal funciton, new aspiration on abx restarted 7/30 PM Pt with increased o2 requirement on 100% FIO2 on BIPAP, d/w family who wanted to proceed with intubation, pt intubated 8/1 Some issues with tachycardia and hypotension overnight. Reproted high ostomy output   Micro: Resp viral panel 7/24 > neg covid/ flu BC  7/24  x 2 neg  MRSA  7/25 neg  UC   7/25  neg  C diff screeen 7/26  neg ET culture 7/31 >  Abx: Unasyn 7/30 >>>7/31 Maxepime 7/31 >>> Flagyl 7/31 >>>  Interim History / Subjective:  Remains on CRRT  Issues with hypotension and tachycardia overnight   Objective   Blood pressure (!) 101/52, pulse (!) 109, temperature (!) 97.4 F (36.3 C), temperature source Axillary, resp. rate (!) 26, height 5\' 8"  (1.727 m), weight 118.6 kg, SpO2 92 %.    Vent Mode: PRVC FiO2 (%):   [50 %-100 %] 70 % Set Rate:  [26 bmp] 26 bmp Vt Set:  [600 mL] 600 mL PEEP:  [5 cmH20] 5 cmH20 Plateau Pressure:  [18 cmH20-20 cmH20] 19 cmH20   Intake/Output Summary (Last 24 hours) at 12/08/2020 0700 Last data filed at 12/08/2020 0600 Gross per 24 hour  Intake 4253.4 ml  Output 2646.1 ml  Net 1607.3 ml    Filed Weights   12/06/20 0943 12/07/20 0500 12/08/20 0447  Weight: 116.6 kg 117 kg 118.6 kg   Examination: General: Acute on chronically ill appearing elderly middle age on mechanical ventilation, in NAD HEENT: ETT, MM pink/moist, PERRL,  Neuro: Sedated on vent CV: s1s2 regular rate and rhythm with episodes of irregular rate, no murmur, rubs, or gallops,  PULM:  Clear to ascultation bilaterally, no increased work of breathing, no added breath sounds  GI: soft, bowel sounds hypoactive in all 4 quadrants, non-tender, non-distended Extremities: warm/dry, non-pitting generalized edema  Skin: no rashes or lesions  Resolved Hospital Problem list     Assessment & Plan:  Acute hypoxic respiratory failure 2/2 Aspiration pneumonitis and RLL atx 7/30 - pt noted to have significant amount of thick dried secretions in posterior oropharynx.  P: Continue ventilator support with lung protective strategies  Wean PEEP and FiO2 for sats greater than 90%. Head of bed elevated 30 degrees. Plateau pressures less than 30 cm H20.  Follow intermittent chest x-ray and ABG.   SAT/SBT as tolerated, mentation preclude extubation  Ensure adequate pulmonary hygiene  Follow  cultures  VAP bundle in place  PAD protocol Continue empiric Maxipime, Flagyl  Circulatory shock vs septic shock  -Patient became progressively hypotensive post intubation, possibly secondary to sedation effect vs evolving sepsis in the setting of recent aspiration  -Procalcitonin 0.45 > 32.28 > 30.55  > 25.09 P: Continue pressor support for MAP greater than 65 Remains critically ill in the ICU Supplemental oxygen via vent as  above  Follow pan  Continue IV antibiotics as above  Trend lactic  Monitor urine output Consider stress dose steroid, will discuss with attending    Oliguric AKI on CKD stage 3a w/ hyperkalemia,  possibly ATN Worsening non anion gap metabolic acidosis - Renal u/s 7/30 nl with FENA  > 1.0  P: Nephrology following, appreciate assistance CRRT started 7/30 Follow renal function  Monitor urine output Trend Bmet Avoid nephrotoxins Ensure adequate renal perfusion    New onset Atrial fibrillation with RVR  -Echocardiogram with preserved LF systolic function P: Continuous telemetry  Continue Amio drip  Eliquis started this admission 7/28, held 7/30  due to inability to safely take PO's Follow for ability to resume IV heparin   Colon cancer, s/p colostomy with recent course of chemo c/b diarrhea.  -Recent dose of chemotherapy.  P: Heme/onc following  Supportive care   T2DM w/ hyperglycemia  -Home medications include Metformin  P: SSI  CBG q4hrs   Hx of Seizures  -EEG previously with encephalopathy but no active seizures Concern for evolving metabolic encephalopathy Hx of prior stoke  -left ischemic stroke 2015 P: Neurology seen during admission  Maintain neuro protective measures; goal for eurothermia, euglycemia, eunatermia, normoxia, and PCO2 goal of 35-40 Nutrition and bowel regiment  Seizure precautions  Continue Keppra  Aspirations precautions    Obesity class 2/ moderate calorie protein malnutrition.  P: Continue TF  Protein supplementation   Stage 2 coccyx pressure ulcer (present on admission) P: Pressure alleviating devices  Wound care   Hx of HTN/HLD -Home medication includes: Norvasc, Lipitor,  P: Home medications remains on hold   Best Practice  Diet/type: tubefeeds DVT prophylaxis: prophylactic heparin  GI prophylaxis: H2B Lines: [place]:  Yes, and it is still needed Code Status:  full code Last date of goals of care discussion      CRITICAL CARE Performed by: Alizey Noren D. Harris   Total critical care time: 45 minutes  Critical care time was exclusive of separately billable procedures and treating other patients.  Critical care was necessary to treat or prevent imminent or life-threatening deterioration.  Critical care was time spent personally by me on the following activities: development of treatment plan with patient and/or surrogate as well as nursing, discussions with consultants, evaluation of patient's response to treatment, examination of patient, obtaining history from patient or surrogate, ordering and performing treatments and interventions, ordering and review of laboratory studies, ordering and review of radiographic studies, pulse oximetry and re-evaluation of patient's condition.  Keylin Podolsky D. Kenton Kingfisher, NP-C [facility] Personal contact information can be found on Amion  12/08/2020, 7:31 AM

## 2020-12-08 NOTE — Progress Notes (Signed)
Subjective:  CRRT running without incident  BP low-  have had to increase pressor dosing - overal 1.7 liters positive last 24 hours and 1300 positive so far today   Objective Vital signs in last 24 hours: Vitals:   12/08/20 1045 12/08/20 1100 12/08/20 1153 12/08/20 1200  BP:  (!) 80/28  (!) 78/50  Pulse: 90   93  Resp: 20 (!) 26  (!) 28  Temp:    99.5 F (37.5 C)  TempSrc:    Oral  SpO2: 98%  97% 100%  Weight:      Height:       Weight change: 2 kg  Intake/Output Summary (Last 24 hours) at 12/08/2020 1428 Last data filed at 12/08/2020 1400 Gross per 24 hour  Intake 5125.75 ml  Output 2599.9 ml  Net 2525.85 ml    Assessment/ Plan: Pt is a 65 y.o. yo male with DM, epilepsy and also colon CA- s/p chemo who was admitted on 11/30/2020 with Afib with RVR with MSOF in the setting of aspiration PNA-  complicated by AKI Assessment/Plan: 1. Renal-  baseline crt 1.5 to 1.6 A on CRF in the setting of MSOF-  CRRT initiated on 7/31- running well.  Essentially no UOP-  need to continue to support with CRRT-  K 4.7-  phos 4.2-  no change to CRRT prescription  2. HTN/volume-  have had to run positive due to hypotension and increased pressor requirements-  not massively overloaded-  will try to pull if gets more stable 3. Pulm-  VDRF-  aspiration PNA-  maxepime /vanc 4. Anemia- supportive care-  given 2 units PRBC 7/31-- hgb much improved 5. Acidosis-  improved with PH over 7.4-   bicarb climbing-  I am going to continue the dialysate and not do only bicarb because it is improving and think it will continue with the bicarb based dialysate.  Lactate up but also giving lactated ringers  6. Afib-  on amio-  pretty well rate controlled  Thomas [name] A Debhora Titus    Labs: Basic Metabolic Panel: Recent Labs  Lab 12/07/20 0021 12/07/20 0301 12/07/20 0932 12/07/20 1744 12/08/20 0405  NA  --    < > 137 139 140  K  --    < > 5.1 4.8 4.7  CL  --    < > 107 109 107  CO2  --    < > 14* 15* 16*  GLUCOSE   --    < > 193* 166* 146*  BUN  --    < > 89* 88* 55*  CREATININE  --    < > 5.49* 4.91* 3.24*  CALCIUM  --    < > 7.8* 7.2* 7.5*  PHOS 6.6*  --   --  5.1*  5.0* 4.2  3.9   < > = values in this interval not displayed.   Liver Function Tests: Recent Labs  Lab 12/07/20 1744 12/08/20 0405  ALBUMIN 1.7* 1.8*   No results for input(s): LIPASE, AMYLASE in the last 168 hours. No results for input(s): AMMONIA in the last 168 hours. CBC: Recent Labs  Lab 12/04/20 0522 12/05/20 0442 12/06/20 0437 12/06/20 2119 12/07/20 0932 12/08/20 0730  WBC 4.2 4.4 4.7 6.1 7.5  --   NEUTROABS 1.5* 1.8 2.0 3.9  --   --   HGB 9.7* 9.6* 8.6* 8.8* 7.6* 10.4*  HCT 27.8* 27.6* 25.2* 25.6* 21.5* 28.9*  MCV 76.6* 77.5* 78.8* 76.9* 74.7*  --   PLT 253 264 245  295 242  --    Cardiac Enzymes: No results for input(s): CKTOTAL, CKMB, CKMBINDEX, TROPONINI in the last 168 hours. CBG: Recent Labs  Lab 12/07/20 2003 12/08/20 0009 12/08/20 0333 12/08/20 0732 12/08/20 1137  GLUCAP 142* 162* 150* 120* 106*    Iron Studies: No results for input(s): IRON, TIBC, TRANSFERRIN, FERRITIN in the last 72 hours. Studies/Results: DG Abd 1 View  Result Date: 12/06/2020 CLINICAL DATA:  NG tube placement. EXAM: ABDOMEN - 1 VIEW COMPARISON:  CT 12/01/2020 FINDINGS: Tip and side port of the enteric tube below the diaphragm in the stomach. Generalized paucity of bowel gas in the upper abdomen. IMPRESSION: Tip and side port of the enteric tube below the diaphragm in the stomach. Electronically Signed   By: Keith Rake M.D.   On: 12/06/2020 23:37   DG Chest Port 1 View  Result Date: 12/07/2020 CLINICAL DATA:  Oxygen desaturation. EXAM: PORTABLE CHEST 1 VIEW COMPARISON:  Radiograph yesterday.  CT 12/01/2020 FINDINGS: Endotracheal tube tip 2 cm from the carina. Enteric tube tip below the diaphragm not included in the field of view. Right chest port remains in place. Improved aeration of the right lung base with decreased  volume loss. Residual patchy basilar opacity. The heart is normal in size. No pulmonary edema, large pleural effusion or pneumothorax. IMPRESSION: 1. Improved aeration of the right lung base with decreased volume loss since radiograph yesterday. Residual patchy right basilar opacity. 2. Stable support apparatus. Electronically Signed   By: Keith Rake M.D.   On: 12/07/2020 22:12   DG CHEST PORT 1 VIEW  Result Date: 12/06/2020 CLINICAL DATA:  Intubation. EXAM: PORTABLE CHEST 1 VIEW COMPARISON:  Radiograph earlier today.  CT 12/01/2020 FINDINGS: Endotracheal tube tip 19 mm from the carina. Enteric tube in place with tip and side-port below the diaphragm. Right chest port unchanged in position. There is progressive volume loss in the right hemithorax with opacification of the lower thorax and rightward mediastinal shift. Improved linear opacity at the left lung base. No pneumothorax. IMPRESSION: 1. Endotracheal tube tip 19 mm from the carina. Enteric tube in place. 2. Progressive volume loss in the right hemithorax with opacification of the lower thorax and rightward mediastinal shift. Findings favor lobar collapse/atelectasis. Linear opacity at the left lung base is improved from earlier today. Electronically Signed   By: Keith Rake M.D.   On: 12/06/2020 23:40   Medications: Infusions:   prismasol BGK 4/2.5 400 mL/hr at 12/08/20 0436    prismasol BGK 4/2.5 200 mL/hr at 12/07/20 1522   sodium chloride Stopped (12/07/20 1403)   sodium chloride 10 mL/hr at 12/08/20 1400   amiodarone 30 mg/hr (12/08/20 1400)   angiotensin II (GIAPREZA) infusion     [START ON 12/09/2020] anidulafungin     anidulafungin     ceFEPime (MAXIPIME) IV Stopped (12/08/20 1303)   chlorproMAZINE (THORAZINE) IV Stopped (12/03/20 0544)   famotidine (PEPCID) IV Stopped (12/08/20 0110)   fentaNYL infusion INTRAVENOUS 65 mcg/hr (12/08/20 1400)   heparin 10,000 units/ 20 mL infusion syringe 500 Units/hr (12/08/20 0853)    levETIRAcetam Stopped (12/08/20 1136)   metronidazole Stopped (12/08/20 1201)   midazolam 1 mg/hr (12/08/20 1400)   norepinephrine (LEVOPHED) Adult infusion 14 mcg/min (12/08/20 1400)   prismasol BGK 4/2.5 1,800 mL/hr at 12/08/20 1147   promethazine (PHENERGAN) injection (IM or IVPB)     thiamine injection Stopped (12/08/20 1224)   vancomycin     [START ON 12/09/2020] vancomycin     vasopressin Stopped (12/08/20 1034)  Scheduled Medications:  acetylcysteine  2 mL Nebulization QID   albuterol  2.5 mg Nebulization QID   chlorhexidine gluconate (MEDLINE KIT)  15 mL Mouth Rinse BID   Chlorhexidine Gluconate Cloth  6 each Topical Daily   feeding supplement (VITAL HIGH PROTEIN)  1,000 mL Per Tube Q24H   heparin injection (subcutaneous)  5,000 Units Subcutaneous Q8H   hydrocerin   Topical BID   hydrocortisone sod succinate (SOLU-CORTEF) inj  100 mg Intravenous Q8H   insulin aspart  1-3 Units Subcutaneous Q4H   iohexol       mouth rinse  15 mL Mouth Rinse 10 times per day   metoprolol tartrate  2.5 mg Intravenous Q6H   OLANZapine  5 mg Intramuscular QHS    have reviewed scheduled and prn medications.  Physical Exam: General: sedated, intubated Heart: borderline tachy-  irreg  Lungs: CBS bilat Abdomen: obese-  ostomy in RLQ-  liquid stool Extremities: minimal dep edema Dialysis Access: left femoral HD cath     12/08/2020,2:28 PM  LOS: 7 days

## 2020-12-08 NOTE — Consult Note (Signed)
Consultation Note Date: 12/08/2020   Patient Name: Thomas [name]  DOB: Mar 17, 1956  MRN: [id number]  Age / Sex: 65 y.o., male  PCP: [name], NP Referring Physician: Brand Males, MD  Reason for Consultation: Establishing goals of care  HPI/Patient Profile: 65 y.o. male  with past medical history of stage 3b colon cancer, L MCA stroke, frontal brain tumor, seizures, HTN, HLD, sleep apnea, aspiration pneumonia, diabetes admitted on 11/30/2020 with  abd pain, anorexia, and weakness. Hospitalization complicated by septic shock of uncertain origin (potentially likely colitis vs pneumonia) leading to need for intubation, vasopressors, CRRT. His condition remains tenuous in ICU.  Clinical Assessment and Goals of Care: I met today at Thomas [name]'s bedside and he is critically ill in ICU and unable to participate in conversation as he is sedated on ventilator support.   I called wife, Cy Blamer, and we reviewed his status briefly. We discussed Thomas [name] as a person and what is important to him. 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 Jerlyn Ly expresses that she has continued to stay in prayer about her husband's situation and wanting to make the right decisions for him. We further discussed their faith and how this brings them clarity in decisions. Jerlyn Ly expresses her desire to have other family included in goals of care conversation. She initially desired meeting Wednesday 1800 but we discussed that tomorrow would be more preferable and between 8a-4p would enable more availability from providers to participate in discussion. [place] understands and will speak with family to plan meeting for tomorrow and she desires representation from herself, children, and siblings. Discussed goal of meeting to clarify Thomas [name]'s health status and ensure that the medical team and  family are all on the same page on how to move forward. Jerlyn Ly expresses that she feels that family are all on the same page with wishes to move forward and plans to discuss this further tomorrow.   All questions/concerns addressed. Emotional support provided.   Primary Decision Maker NEXT OF KIN wife    SUMMARY OF RECOMMENDATIONS   - Family meeting tomorrow - time to be determined  Code Status/Advance Care Planning: Full code   Symptom Management:  Per PCCM.   Palliative Prophylaxis:  Aspiration, Bowel Regimen, Delirium Protocol, Frequent Pain Assessment, Oral Care, and Turn Reposition  Additional Recommendations (Limitations, Scope, Preferences): Full Scope Treatment  Psycho-social/Spiritual:  Desire for further Chaplaincy support:no  Prognosis:  Critically ill - prognosis guarded.   Discharge Planning: To Be Determined      Primary Diagnoses: Present on Admission:  Lactic acidosis  Tachycardia  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 (HCC)  Adverse effect of chemotherapy  Sepsis ([place])  Metabolic acidosis  Acute respiratory failure with hypoxemia ([place])   I have reviewed the medical record, interviewed the patient and family, and examined the patient. The following aspects are pertinent.  Past Medical History:  Diagnosis Date   Acute ischemic left MCA stroke ([place]) 04/09/2014  Acute respiratory failure with hypoxia ([place]) 04/09/2014   Aspiration pneumonia ([place])    Asthma    as a child   Brain tumor ([place])    frontal   Cancer ([place])    Confusion    occasionally   Diabetes mellitus without complication ([place])    takes Metformin daily.   Dizziness    Dyspnea    pt states d/t weight   Epilepsy (HCC)    takes Keppra daily   GERD (gastroesophageal reflux disease)    takes Omeprazole daily   Headache    Hyperlipidemia    takes Atorvastatin daily   Hypertension    takes Lotrel daily   Peripheral edema    takes Lasix daily    Peripheral neuropathy    takes Gabapentin daily   Pneumonia 3 yrs ago   hx of   Seizures (HCC)    Sleep apnea    Urinary frequency    Social History   Socioeconomic History   Marital status: Married    Spouse name: Cytogeneticist   Number of children: 4   Years of education: Not on file   Highest education level: Not on file  Occupational History   Occupation: Disabled  Tobacco Use   Smoking status: Former   Smokeless tobacco: Never   Tobacco comments:    quit smoking 35 yrs ago  Vaping Use   Vaping Use: Never used  Substance and Sexual Activity   Alcohol use: No   Drug use: No   Sexual activity: Not on file  Other Topics Concern   Not on file  Social History Narrative   Married to his wife, Jerlyn Ly with total of #4 children (#2 with current wife). Lives in home with his son and daughter-in-law.     Social Determinants of Health   Financial Resource Strain: Not on file  Food Insecurity: Not on file  Transportation Needs: Not on file  Physical Activity: Not on file  Stress: Not on file  Social Connections: Not on file   Family History  Problem Relation Age of Onset   Breast cancer Mother    Breast cancer Maternal Aunt    Prostate cancer Maternal Uncle    Scheduled Meds:  acetylcysteine  2 mL Nebulization QID   albuterol  2.5 mg Nebulization QID   chlorhexidine gluconate (MEDLINE KIT)  15 mL Mouth Rinse BID   Chlorhexidine Gluconate Cloth  6 each Topical Daily   feeding supplement (VITAL HIGH PROTEIN)  1,000 mL Per Tube Q24H   hydrocerin   Topical BID   hydrocortisone sod succinate (SOLU-CORTEF) inj  100 mg Intravenous Q8H   insulin aspart  1-3 Units Subcutaneous Q4H   mouth rinse  15 mL Mouth Rinse 10 times per day   metoprolol tartrate  2.5 mg Intravenous Q6H   OLANZapine  5 mg Intramuscular QHS   Continuous Infusions:   prismasol BGK 4/2.5 400 mL/hr at 12/08/20 0436    prismasol BGK 4/2.5 200 mL/hr at 12/07/20 1522   sodium chloride Stopped (12/07/20 1403)    sodium chloride Stopped (12/08/20 1457)   amiodarone 30 mg/hr (12/08/20 1600)   angiotensin II (GIAPREZA) infusion     [START ON 12/09/2020] anidulafungin     anidulafungin 78 mL/hr at 12/08/20 1600   ceFEPime (MAXIPIME) IV Stopped (12/08/20 1303)   chlorproMAZINE (THORAZINE) IV Stopped (12/03/20 0544)   famotidine (PEPCID) IV Stopped (12/08/20 0110)   fentaNYL infusion INTRAVENOUS 65 mcg/hr (12/08/20 1600)   heparin 10,000 units/ 20 mL infusion  syringe 500 Units/hr (12/08/20 0853)   heparin     levETIRAcetam Stopped (12/08/20 1136)   metronidazole Stopped (12/08/20 1201)   midazolam 1 mg/hr (12/08/20 1600)   norepinephrine (LEVOPHED) Adult infusion 20 mcg/min (12/08/20 1600)   prismasol BGK 4/2.5 1,800 mL/hr at 12/08/20 1437   promethazine (PHENERGAN) injection (IM or IVPB)     thiamine injection Stopped (12/08/20 1224)   vancomycin 250 mL/hr at 12/08/20 1600   [START ON 12/09/2020] vancomycin     vasopressin Stopped (12/08/20 1034)   PRN Meds:.sodium chloride, sodium chloride, acetaminophen, alteplase, chlorproMAZINE (THORAZINE) IV, dextrose, docusate, fentaNYL, heparin, magic mouthwash, promethazine (PHENERGAN) injection (IM or IVPB), sodium chloride, sodium chloride flush No Known Allergies Review of Systems  Unable to perform ROS: Acuity of condition   Physical Exam Constitutional:      Appearance: He is ill-appearing.     Interventions: He is intubated.  Cardiovascular:     Rate and Rhythm: Normal rate. Rhythm irregularly irregular.  Pulmonary:     Effort: No tachypnea, accessory muscle usage or respiratory distress. He is intubated.  Abdominal:     Palpations: Abdomen is soft.     Comments: Increased ostomy output  Neurological:     Comments: Sedated on vent    Vital Signs: BP 112/90   Pulse 97   Temp 99.1 F (37.3 C) (Oral)   Resp (!) 28   Ht 5' 8"  (1.727 m)   Wt 118.6 kg   SpO2 92%   BMI 39.76 kg/m  Pain Scale: CPOT   Pain Score: 0-No pain   SpO2:  SpO2: 92 % O2 Device:SpO2: 92 % O2 Flow Rate: .O2 Flow Rate (L/min): 4 L/min  IO: Intake/output summary:  Intake/Output Summary (Last 24 hours) at 12/08/2020 1620 Last data filed at 12/08/2020 1600 Gross per 24 hour  Intake 5933.11 ml  Output 1902.9 ml  Net 4030.21 ml    LBM: Last BM Date: 12/08/20 Baseline Weight: Weight: 111.8 kg Most recent weight: Weight: 118.6 kg     Palliative Assessment/Data:      Time Total: 30 min  Greater than 50%  of this time was spent counseling and coordinating care related to the above assessment and plan.  Signed by: Vinie Sill, NP Palliative Medicine Team Pager # [phone] (M-F 8a-5p) Team Phone # [phone] (Nights/Weekends)

## 2020-12-08 NOTE — Progress Notes (Signed)
ANTICOAGULATION CONSULT NOTE - Initial Consult  Pharmacy Consult for IV heparin Indication: atrial fibrillation  No Known Allergies  Patient Measurements: Height: 5\' 8"  (172.7 cm) Weight: 118.6 kg (261 lb 7.5 oz) IBW/kg (Calculated) : 68.4 Heparin Dosing Weight: 93.4 kg  Vital Signs: Temp: 99.5 F (37.5 C) (08/01 1200) Temp Source: Oral (08/01 1200) BP: 78/50 (08/01 1200) Pulse Rate: 93 (08/01 1200)  Labs: Recent Labs    12/06/20 0437 12/06/20 2119 12/07/20 0301 12/07/20 0932 12/07/20 1744 12/08/20 0405 12/08/20 0730  HGB 8.6* 8.8*  --  7.6*  --   --  10.4*  HCT 25.2* 25.6*  --  21.5*  --   --  28.9*  PLT 245 295  --  242  --   --   --   APTT  --   --   --   --   --  67*  --   CREATININE 3.78* 4.69*   < > 5.49* 4.91* 3.24*  --   TROPONINIHS  --  25*  --   --   --   --   --    < > = values in this interval not displayed.    Estimated Creatinine Clearance: 28.8 mL/min (A) (by C-G formula based on SCr of 3.24 mg/dL (H)).   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   Dizziness    Dyspnea    pt states d/t weight   Epilepsy ([place])    takes Keppra daily   GERD (gastroesophageal reflux disease)    takes Omeprazole daily   Headache    Hyperlipidemia    takes Atorvastatin daily   Hypertension    takes Lotrel daily   Peripheral edema    takes Lasix daily   Peripheral neuropathy    takes Gabapentin daily   Pneumonia 3 yrs ago   hx of   Seizures ([facility])    Sleep apnea    Urinary frequency     Medications:  On apixaban 5 mg po BID 12/04/2020-12/05/2020 for new-onset afib.  Last dose 7/29 2144. On heparin 5000 units subq every 8 hours since  7/31.  Last dose 8/1 0608.  Assessment: 65 y/o M with a h/o colon cancer s/p colectomy on chemo admitted with SIRS complicated with atrial fibrillation with rapid ventricular response, acute hypoxic respiratory failure secondary to aspiration pneumonia and severe delirium.  Now on the ventilator with acute renal failure on CRRT and pressors. Pharmacy consulted to dose IV heparin.    Hemoglobin up to 10.4, Plt WNL  Goal of Therapy:  Heparin level 0.3-0.7 units/ml Monitor platelets by anticoagulation protocol: Yes   Plan:  Discontinue heparin 5000 units subq q8h Remains on CRRT heparin per nephrology Begin IV heparin continuous infusion at 1400 units/hour (with no heparin bolus) Obtain 8 hour heparin level Monitor daily heparin level, CBC, s/s bleeding   Malaysha Arlen P. Legrand Como, PharmD, [place] Please utilize Amion for appropriate phone number to reach the unit pharmacist ([place]) 12/08/2020 3:29 PM

## 2020-12-08 NOTE — Progress Notes (Signed)
Nutrition Follow-up  DOCUMENTATION CODES:   Non-severe (moderate) malnutrition in context of chronic illness, Obesity unspecified  INTERVENTION:   -Will monitor for plan for TF vs. TPN  Monitor magnesium, potassium, and phosphorus daily for at least 3 days, MD to replete as needed, as pt is at risk for refeeding syndrome.  NUTRITION DIAGNOSIS:   Moderate Malnutrition related to chronic illness, cancer and cancer related treatments as evidenced by mild fat depletion, mild muscle depletion, percent weight loss.  Ongoing.  GOAL:   Patient will meet greater than or equal to 90% of their needs  Not meeting.  MONITOR:   Diet advancement, Labs, Weight trends, I & O's, Other (Comment) (need for nutrition support)  REASON FOR ASSESSMENT:   Consult Enteral/tube feeding initiation and management  ASSESSMENT:   65 y.o. male who is a former with medical history of stage 3 colon cancer s/p partial colectomy and end colostomy on 5/4, recent chemo start with 3rd cycle on 7/22, sleep apnea, seizures, epilepsy, brain tumor, HTN, HLD, GERD, asthma, peripheral neuropathy, urinary frequency, DM, MCA stroke. He presented to the ED via EMS due to abdominal pain x2 weeks, anorexia, and weakness. Admitted for close monitoring for concern of mesenteric ischemia. Patient reported that abdominal pain was worse with eating PTA.  7/24: admitted 7/30: intubated, CRRT initiated  Consult was received over the weekend to start tube feeds but now surgery recommending TF be held d/t risk of bowel ischemia and increased need of pressors.  Recommend initiation of TPN if pressor needs do not improve. Pt has only had 1 day on a solid diet since admission 7 days ago. May be at refeeding risk.   Patient is currently intubated on ventilator support MV: 15.9 L/min Temp (24hrs), Avg:98.2 F (36.8 C), Min:97.4 F (36.3 C), Max:99.3 F (37.4 C)  Admission weight: 246 lbs. Current weight: 261  lbs.  Medications: IV Pepcid, Lactated ringers, Phenylephrine, Thiamine, Vasopressin  Labs reviewed: CBGs: 120-162  Diet Order:   Diet Order             Diet NPO time specified  Diet effective now                   EDUCATION NEEDS:   Not appropriate for education at this time  Skin:  Skin Assessment: Skin Integrity Issues: Skin Integrity Issues:: Stage II Stage II: mid-coccyx  Last BM:  8/1 -colostomy  Height:   Ht Readings from Last 1 Encounters:  12/01/20 5\' 8"  (1.727 m)    Weight:   Wt Readings from Last 1 Encounters:  12/08/20 118.6 kg    Ideal Body Weight:  70 kg  BMI:  Body mass index is 39.76 kg/m.  Estimated Nutritional Needs:   Kcal:  2600-2800  Protein:  130-145g  Fluid:  >/= 2.8 L/day   Clayton Bibles, MS, RD, LDN Inpatient Clinical Dietitian Contact information available via Amion

## 2020-12-08 NOTE — TOC Progression Note (Signed)
Transition of Care [facility]) - Progression Note    Patient Details  Name: Thomas [name] MRN: [id number] Date of Birth: 12/10/1955  [facility] CM/SW Contact  Leeroy Cha, RN Phone Number: 12/08/2020, 8:57 AM  Clinical Narrative:    Subjective: Patient sedated on the vent. RN reports colostomy output is decreasing but patient also having large volume from rectum. Patient on CRRT. Pressor requirement has increased. Still in A. Fib on amio gtt.    Expected Discharge Plan: Home/Self Care Barriers to Discharge: Continued Medical Work up  Expected Discharge Plan and Services Expected Discharge Plan: Home/Self Care   Discharge Planning Services: CM Consult   Living arrangements for the past 2 months: Single Family Home                                       Social Determinants of Health (SDOH) Interventions    Readmission Risk Interventions No flowsheet data found.

## 2020-12-08 NOTE — Progress Notes (Signed)
HEMATOLOGY-ONCOLOGY PROGRESS NOTE  SUBJECTIVE: Thomas [name] developed progressive respiratory failure over the weekend and required intubation.  He remains intubated and sedated.  He also developed progressive renal failure and is now maintained on CRRT. He developed hypotension requiring pressor support. Oncology History  Colon cancer ([place])  09/23/2020 Initial Diagnosis   Colon cancer ([place])    09/23/2020 Cancer Staging   Staging form: Colon and Rectum, AJCC 8th Edition - Pathologic: Stage IIIB (pT3, pN1a, cM0) - Signed by Ladell Pier, MD on 09/23/2020  Total positive nodes: 1  Histologic grading system: 4 grade system  Histologic grade (G): G1  Residual tumor (R): R0 - None  Lymph-vascular invasion (LVI): LVI not present (absent)/not identified  Tumor deposits (TD): Absent  Perineural invasion (PNI): Absent  Microsatellite instability (MSI): Stable    10/08/2020 -  Chemotherapy    Patient is on Treatment Plan: COLORECTAL XELOX (CAPEOX) Q21D         PHYSICAL EXAMINATION:  Vitals:   12/08/20 1153 12/08/20 1200  BP:  (!) 78/50  Pulse:  93  Resp:  (!) 28  Temp:  99.5 F (37.5 C)  SpO2: 97% 100%   Filed Weights   12/06/20 0943 12/07/20 0500 12/08/20 0447  Weight: 257 lb 0.9 oz (116.6 kg) 257 lb 15 oz (117 kg) 261 lb 7.5 oz (118.6 kg)    Intake/Output from previous day: 07/31 0701 - 08/01 0700 In: 4397 [I.V.:2888.2; Blood:758.8; IV Piggyback:750] Out: 2718.9 [Urine:607; Emesis/NG output:412.5; VQMGQ:6761]  GENERAL:a intubated, sedated SKIN: Dryness of the palms and soles, erythema with superficial desquamation and linear ulcers at the hands, superficial skin breakdown at the scrotum, hyperpigmentation of the hands-hyperpigmentation and skin thickening at the hands appears improved  OROPHARYNX: ETT in place  ABDOMEN: Right abdomen colostomy with liquid stool NEURO: Sedated  Port-A-Cath without erythema  LABORATORY DATA:  I have reviewed the data as  listed CMP Latest Ref Rng & Units 12/08/2020 12/07/2020 12/07/2020  Glucose 70 - 99 mg/dL 146(H) 166(H) 193(H)  BUN 8 - 23 mg/dL 55(H) 88(H) 89(H)  Creatinine 0.61 - 1.24 mg/dL 3.24(H) 4.91(H) 5.49(H)  Sodium 135 - 145 mmol/L 140 139 137  Potassium 3.5 - 5.1 mmol/L 4.7 4.8 5.1  Chloride 98 - 111 mmol/L 107 109 107  CO2 22 - 32 mmol/L 16(L) 15(L) 14(L)  Calcium 8.9 - 10.3 mg/dL 7.5(L) 7.2(L) 7.8(L)  Total Protein 6.5 - 8.1 g/dL - - -  Total Bilirubin 0.3 - 1.2 mg/dL - - -  Alkaline Phos 38 - 126 U/L - - -  AST 15 - 41 U/L - - -  ALT 0 - 44 U/L - - -    Lab Results  Component Value Date   WBC 7.5 12/07/2020   HGB 10.4 (L) 12/08/2020   HCT 28.9 (L) 12/08/2020   MCV 74.7 (L) 12/07/2020   PLT 242 12/07/2020   NEUTROABS 3.9 12/06/2020    DG Chest 1 View  Result Date: 12/06/2020 CLINICAL DATA:  Shortness of breath. Patient undergoing chemotherapy for stage IIIB colon cancer. EXAM: CHEST  1 VIEW COMPARISON:  December 04, 2020 FINDINGS: Platelike opacity in the left base consistent with atelectasis or scar, stable. New opacity in the right base obscuring the elevated right hemidiaphragm. The heart, hila, and mediastinum are unchanged and unremarkable. The right Port-A-Cath is stable. No pulmonary nodules or masses. No other abnormalities. IMPRESSION: 1. Stable platelike opacity in the left base consistent with scar or atelectasis. 2. New opacity in the right base obscuring the  elevated right hemidiaphragm. The opacity could represent atelectasis versus developing infiltrate. Recommend clinical correlation and short-term follow-up imaging to ensure resolution. Electronically Signed   By: Dorise Bullion III M.D   On: 12/06/2020 11:50   DG Abd 1 View  Result Date: 12/06/2020 CLINICAL DATA:  NG tube placement. EXAM: ABDOMEN - 1 VIEW COMPARISON:  CT 12/01/2020 FINDINGS: Tip and side port of the enteric tube below the diaphragm in the stomach. Generalized paucity of bowel gas in the upper abdomen.  IMPRESSION: Tip and side port of the enteric tube below the diaphragm in the stomach. Electronically Signed   By: Keith Rake M.D.   On: 12/06/2020 23:37   CT HEAD WO CONTRAST  Result Date: 12/05/2020 CLINICAL DATA:  Acute neuro deficit, agitation and delirium, history of colon carcinoma EXAM: CT HEAD WITHOUT CONTRAST TECHNIQUE: Contiguous axial images were obtained from the base of the skull through the vertex without intravenous contrast. COMPARISON:  09/08/2016 and previous FINDINGS: Brain: Previous left frontal craniotomy. Stable encephalomalacia in left frontal lobe. Negative for acute intracranial hemorrhage, midline shift, no parenchymal edema, mass or mass effect. No hydrocephalus. Mild diffuse parenchymal atrophy as before. Vascular: Atherosclerotic and physiologic intracranial calcifications. Skull: Changes of left temporal craniotomy. No acute fracture or worrisome bone lesion. Sinuses/Orbits: No acute finding. Other: None IMPRESSION: 1. Negative for bleed or other acute intracranial process. 2. Stable changes of left frontal craniotomy Electronically Signed   By: Lucrezia Europe M.D.   On: 12/05/2020 11:34   US RENAL  Result Date: 12/06/2020 CLINICAL DATA:  AK I EXAM: RENAL / URINARY TRACT ULTRASOUND COMPLETE COMPARISON:  Renal ultrasound 10/02/2020 FINDINGS: Right Kidney: Renal measurements: 9.8 x 6.3 x 5.9 cm = volume: 190 mL. Echogenicity within normal limits. No mass or hydronephrosis visualized. Left Kidney: Renal measurements: 11.9 x 7.2 x 5.8 cm = volume: 262 mL. Echogenicity within normal limits. No hydronephrosis. There is a cyst in the lateral aspect of the kidney measuring 4.5 x 4.0 x 4.0 cm. Bladder: Appears decompressed with Foley in place. Other: None. IMPRESSION: No acute abnormality sonographically in the kidneys. Exam is limited by difficulty with patient positioning and shadowing bowel gas. Electronically Signed   By: Audie Pinto M.D.   On: 12/06/2020 11:47   DG Chest  Port 1 View  Result Date: 12/07/2020 CLINICAL DATA:  Oxygen desaturation. EXAM: PORTABLE CHEST 1 VIEW COMPARISON:  Radiograph yesterday.  CT 12/01/2020 FINDINGS: Endotracheal tube tip 2 cm from the carina. Enteric tube tip below the diaphragm not included in the field of view. Right chest port remains in place. Improved aeration of the right lung base with decreased volume loss. Residual patchy basilar opacity. The heart is normal in size. No pulmonary edema, large pleural effusion or pneumothorax. IMPRESSION: 1. Improved aeration of the right lung base with decreased volume loss since radiograph yesterday. Residual patchy right basilar opacity. 2. Stable support apparatus. Electronically Signed   By: Keith Rake M.D.   On: 12/07/2020 22:12   DG CHEST PORT 1 VIEW  Result Date: 12/06/2020 CLINICAL DATA:  Intubation. EXAM: PORTABLE CHEST 1 VIEW COMPARISON:  Radiograph earlier today.  CT 12/01/2020 FINDINGS: Endotracheal tube tip 19 mm from the carina. Enteric tube in place with tip and side-port below the diaphragm. Right chest port unchanged in position. There is progressive volume loss in the right hemithorax with opacification of the lower thorax and rightward mediastinal shift. Improved linear opacity at the left lung base. No pneumothorax. IMPRESSION: 1. Endotracheal tube tip 19  mm from the carina. Enteric tube in place. 2. Progressive volume loss in the right hemithorax with opacification of the lower thorax and rightward mediastinal shift. Findings favor lobar collapse/atelectasis. Linear opacity at the left lung base is improved from earlier today. Electronically Signed   By: Keith Rake M.D.   On: 12/06/2020 23:40   DG CHEST PORT 1 VIEW  Result Date: 12/04/2020 CLINICAL DATA:  Vomiting with possible aspiration. EXAM: PORTABLE CHEST 1 VIEW COMPARISON:  November 30, 2020 FINDINGS: There is stable right-sided venous Port-A-Cath positioning. Mild linear atelectasis is seen within the mid left  lung. This represents a new finding when compared to the prior exam. There is no evidence of a pleural effusion or pneumothorax. The heart size and mediastinal contours are within normal limits. The visualized skeletal structures are unremarkable. IMPRESSION: Interval development of mild linear atelectasis within the mid left lung since the prior study. Electronically Signed   By: Virgina Norfolk M.D.   On: 12/04/2020 01:19   DG Chest Port 1 View  Result Date: 11/30/2020 CLINICAL DATA:  Questionable sepsis. Patient began treatment for colon cancer 3 months ago. Loss of appetite, severe abdominal pain, weakness, and back pain. EXAM: PORTABLE CHEST 1 VIEW COMPARISON:  11/27/2020 FINDINGS: Heart size and pulmonary vascularity are normal. Lungs clear. Power port type central venous catheter with tip over the low SVC region. No pneumothorax. No pleural effusions. Mediastinal contours appear intact. IMPRESSION: No active disease. Electronically Signed   By: Lucienne Capers M.D.   On: 11/30/2020 22:36   DG Chest Port 1 View  Result Date: 11/27/2020 CLINICAL DATA:  Weakness.  Fall today. EXAM: PORTABLE CHEST 1 VIEW COMPARISON:  Radiograph 11/15/2020 FINDINGS: Right chest port unchanged in position. Normal heart size and mediastinal contours. No acute or focal airspace disease no pleural fluid, pulmonary edema, or pneumothorax. No acute osseous abnormalities are seen. IMPRESSION: No acute chest findings. Electronically Signed   By: Keith Rake M.D.   On: 11/27/2020 22:09   DG Chest Portable 1 View  Result Date: 11/15/2020 CLINICAL DATA:  Fatigue, colon cancer EXAM: PORTABLE CHEST 1 VIEW COMPARISON:  10/01/2020 FINDINGS: Right Port-A-Cath remains in place, unchanged. Heart and mediastinal contours are within normal limits. No focal opacities or effusions. No acute bony abnormality. IMPRESSION: No active disease. Electronically Signed   By: Rolm Baptise M.D.   On: 11/15/2020 14:34   EEG adult  Result  Date: 12/05/2020 Lora Havens, MD     12/05/2020  6:19 PM Patient Name: JAIDEV SANGER MRN: [id number] Epilepsy Attending: Lora Havens Referring Physician/Provider: Dr Sander Radon Date: 12/05/2020 Duration: 23.18 mins Patient history: 65yo M withh/o epilepsy presented with ams. EEG to evaluate for seizure Level of alertness: Awake AEDs during EEG study: LEV Technical aspects: This EEG study was done with scalp electrodes positioned according to the 10-20 International system of electrode placement. Electrical activity was acquired at a sampling rate of 500Hz and reviewed with a high frequency filter of 70Hz and a low frequency filter of 1Hz. EEG data were recorded continuously and digitally stored. Description: No posterior dominant rhythm was seen. EEG showed continuous generalized 3 to 6 Hz theta-delta slowing. There is also 3-5hz sharply contoured theta-delta slowing with overriding 13-15hz beta activity in left frontotemporal region consistent with breach artifact.  Hyperventilation and photic stimulation were not performed.   ABNORMALITY - Continuous slow, generalized - Breach artifact, left frontotemporal region IMPRESSION: This study is suggestive of cortical dysfunction arising from left frontotemporal region  likely secondary to underlying encephalomalacia as well as prior craniotomy. There is also moderate diffuse encephalopathy, nonspecific etiology. No seizures or definite epileptiform discharges were seen throughout the recording. Lora Havens   ECHOCARDIOGRAM COMPLETE  Result Date: 12/02/2020    ECHOCARDIOGRAM REPORT   Patient Name:   SOCRATES CAHOON Date of Exam: 12/02/2020 Medical Rec #:  [id number]    Height:       68.0 in Accession #:    [id number]   Weight:       246.5 lb Date of Birth:  1955-07-11    BSA:          2.234 m Patient Age:    63 years     BP:           136/77 mmHg Patient Gender: M            HR:           92 bpm. Exam Location:  Inpatient Procedure: 2D Echo, Color Doppler  and Cardiac Doppler Indications:    I48.91* Unspecified atrial fibrillation  History:        Patient has no prior history of Echocardiogram examinations.                 Arrythmias:Atrial Fibrillation; Risk Factors:Hypertension,                 Diabetes, Dyslipidemia and Sleep Apnea.  Sonographer:    Raquel Sarna Senior RDCS Referring Phys: [phone] [facility]  Sonographer Comments: Technically difficult due to patient body habitus. IMPRESSIONS  1. Mild intracavitary gradient. Peak velocity 1.37 m/s. Peak gradient 7.5 mmHg. Left ventricular ejection fraction, by estimation, is 60 to 65%. The left ventricle has normal function. The left ventricle has no regional wall motion abnormalities. There is moderate concentric left ventricular hypertrophy. Left ventricular diastolic parameters are consistent with Grade I diastolic dysfunction (impaired relaxation).  2. Right ventricular systolic function is normal. The right ventricular size is normal. There is normal pulmonary artery systolic pressure.  3. The mitral valve is normal in structure. No evidence of mitral valve regurgitation. No evidence of mitral stenosis.  4. The aortic valve is calcified. There is moderate calcification of the aortic valve. Aortic valve regurgitation is not visualized. No aortic stenosis is present.  5. The inferior vena cava is normal in size with greater than 50% respiratory variability, suggesting right atrial pressure of 3 mmHg. FINDINGS  Left Ventricle: Mild intracavitary gradient. Peak velocity 1.37 m/s. Peak gradient 7.5 mmHg. Left ventricular ejection fraction, by estimation, is 60 to 65%. The left ventricle has normal function. The left ventricle has no regional wall motion abnormalities. The left ventricular internal cavity size was normal in size. There is moderate concentric left ventricular hypertrophy. Left ventricular diastolic parameters are consistent with Grade I diastolic dysfunction (impaired relaxation). Indeterminate filling  pressures. Right Ventricle: The right ventricular size is normal. No increase in right ventricular wall thickness. Right ventricular systolic function is normal. There is normal pulmonary artery systolic pressure. The tricuspid regurgitant velocity is 1.72 m/s, and  with an assumed right atrial pressure of 8 mmHg, the estimated right ventricular systolic pressure is 16.0 mmHg. Left Atrium: Left atrial size was normal in size. Right Atrium: Right atrial size was normal in size. Pericardium: There is no evidence of pericardial effusion. Mitral Valve: The mitral valve is normal in structure. No evidence of mitral valve regurgitation. No evidence of mitral valve stenosis. Tricuspid Valve: The tricuspid valve is normal in structure. Tricuspid  valve regurgitation is trivial. No evidence of tricuspid stenosis. Aortic Valve: The aortic valve is calcified. There is moderate calcification of the aortic valve. Aortic valve regurgitation is not visualized. No aortic stenosis is present. Pulmonic Valve: The pulmonic valve was normal in structure. Pulmonic valve regurgitation is not visualized. No evidence of pulmonic stenosis. Aorta: The aortic root is normal in size and structure. Venous: The inferior vena cava is normal in size with greater than 50% respiratory variability, suggesting right atrial pressure of 3 mmHg. IAS/Shunts: No atrial level shunt detected by color flow Doppler.  LEFT VENTRICLE PLAX 2D LVIDd:         3.20 cm  Diastology LVIDs:         2.10 cm  LV e' medial:    4.68 cm/s LV PW:         1.30 cm  LV E/e' medial:  11.5 LV IVS:        1.50 cm  LV e' lateral:   6.85 cm/s LVOT diam:     2.00 cm  LV E/e' lateral: 7.9 LV SV:         43 LV SV Index:   19 LVOT Area:     3.14 cm  RIGHT VENTRICLE RV S prime:     13.50 cm/s TAPSE (M-mode): 1.6 cm LEFT ATRIUM             Index       RIGHT ATRIUM           Index LA diam:        3.20 cm 1.43 cm/m  RA Area:     12.50 cm LA Vol (A2C):   46.5 ml 20.82 ml/m RA Volume:    26.60 ml  11.91 ml/m LA Vol (A4C):   33.6 ml 15.04 ml/m LA Biplane Vol: 41.5 ml 18.58 ml/m  AORTIC VALVE LVOT Vmax:   98.65 cm/s LVOT Vmean:  79.600 cm/s LVOT VTI:    0.138 m  AORTA Ao Root diam: 3.10 cm MITRAL VALVE                TRICUSPID VALVE MV Area (PHT): 3.10 cm     TR Peak grad:   11.8 mmHg MV Decel Time: 245 msec     TR Vmax:        172.00 cm/s MV E velocity: 54.00 cm/s MV A velocity: 102.00 cm/s  SHUNTS MV E/A ratio:  0.53         Systemic VTI:  0.14 m                             Systemic Diam: 2.00 cm Skeet Latch MD Electronically signed by Skeet Latch MD Signature Date/Time: 12/02/2020/5:47:16 PM    Final    CT CHEST ABDOMEN PELVIS WO CONTRAST  Result Date: 12/01/2020 CLINICAL DATA:  Abdominal pain and fever EXAM: CT CHEST, ABDOMEN AND PELVIS WITHOUT CONTRAST TECHNIQUE: Multidetector CT imaging of the chest, abdomen and pelvis was performed following the standard protocol without IV contrast. COMPARISON:  09/04/2020 FINDINGS: CT CHEST FINDINGS Cardiovascular: Calcific atherosclerosis of the aorta and coronary arteries. No pericardial effusion. Course and caliber of the aorta are normal. Right chest wall Port-A-Cath tip is at the cavoatrial junction. Mediastinum/Nodes: No enlarged mediastinal, hilar, or axillary lymph nodes. Thyroid gland, trachea, and esophagus demonstrate no significant findings. Lungs/Pleura: Lungs are clear. No pleural effusion or pneumothorax. Musculoskeletal: No chest wall mass or suspicious bone lesions identified.  CT ABDOMEN PELVIS FINDINGS Hepatobiliary: No focal liver abnormality is seen. No gallstones, gallbladder wall thickening, or biliary dilatation. Pancreas: Unremarkable. No pancreatic ductal dilatation or surrounding inflammatory changes. Spleen: Normal in size without focal abnormality. Adrenals/Urinary Tract: Adrenal glands are unremarkable. Kidneys are normal, without renal calculi, focal lesion, or hydronephrosis. Bladder is unremarkable. 3.5 cm left  renal cyst. Stomach/Bowel: Normal appearance of the stomach and duodenum. No small bowel dilatation. 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 Status post partial colectomy. Vascular/Lymphatic: Aortic atherosclerosis. No enlarged abdominal or pelvic lymph nodes. Reproductive: Prostate is unremarkable. Other: Bilateral fat containing inguinal hernias. Musculoskeletal: No acute or significant osseous findings. IMPRESSION: 1. No acute abnormality of the chest, abdomen or pelvis. 2. Status post partial colectomy.  No evidence of obstruction. Aortic Atherosclerosis (ICD10-I70.0). Electronically Signed   By: Ulyses Jarred M.D.   On: 12/01/2020 02:20    ASSESSMENT AND PLAN: Transverse colon cancer, stage IIIb (T3N1a), status post a partial transverse colectomy and end colostomy 09/10/2020 CT abdomen/pelvis 09/04/2020- possible transverse colon mass with small regional lymph nodes Colonoscopy 09/05/2020- obstructing transverse colon mass-biopsy invasive adenocarcinoma, mass could not be passed CT chest 09/09/2020-no evidence of metastatic disease, 3 x 2 mm subpleural nodule in the right upper lobe likely benign subpleural lymph node Partial transverse colectomy 09/10/2020, transverse colon tumor, no lymphovascular perineural invasion, 1/19 lymph nodes positive, negative margins, MSI stable, no loss of mismatch repair protein expression Cycle 1 CAPOX 10/08/2020, oxaliplatin dose reduced to 100 mg per metered squared secondary to renal insufficiency Cycle 2 CAPOX 10/29/2020 Cycle 3 CAPOX 11/19/2020, capecitabine dose reduced secondary to hand/foot syndrome, oxaliplatin dose reduced secondary to renal failure and poor performance status CT chest/abdomen/pelvis without contrast 12/01/2020-no acute abnormality in the chest, abdomen, or pelvis.  No evidence of obstruction. Resection of a frontal meningioma 09/01/2016 Diabetes Rectal polyp- tubular adenoma on colonoscopy 09/05/2020 Hypertension Diabetic  neuropathy Epilepsy Family history of breast and prostate cancer Admission with acute renal failure 10/01/2020-secondary to lack of oral intake and lisinopril, improved with intravenous hydration and holding lisinopril Admission 12/01/2020-sepsis, diarrhea HHS, A. fib with RVR, abdominal pain 11.  Respiratory failure requiring intubation 12/06/2020 12.  Progressive renal failure-CRRT started 12/06/2020  Thomas [name] developed respiratory failure requiring intubation over the weekend.  He also has progressive renal failure and hypotension.  There is concern for aspiration.  He continues to have liquid stool output.  It is unclear whether the stool output is related to the a "high output "colostomy, chemotherapy enteritis, or another etiology.  The stool output appears to have slowed over the past few days.  Thomas [name] is critically ill with multiorgan failure.  He has multiple chronic medical conditions.  He is completing adjuvant chemotherapy for stage III colon cancer with a goal of cure.  Recommendations: 1.  Continue management of sepsis syndrome and respiratory failure per critical care medicine 2.  Moisturizers, skin care to the hands and scrotum 3.  Management of renal failure/fluids per nephrology 4.  Oncology will continue following him, I discussed the case with Dr. Chase Caller   LOS: 7 days   Betsy Coder, MD 12/08/20

## 2020-12-08 NOTE — Progress Notes (Signed)
SLP Cancellation Note  Patient Details Name: JEIDEN DAUGHTRIDGE MRN: [id number] DOB: 1956/03/14   Cancelled treatment:       Reason Eval/Treat Not Completed: Medical issues which prohibited therapy;Other (comment) (patient on vent; SLP will follow for readiness.)   [name] Baller, MA, CCC-SLP Speech Therapy

## 2020-12-08 NOTE — Progress Notes (Signed)
PCCM Progress Note   Patient continues to have increasing pressor requirements with worsening lactic acidosis indicating progressive septic shock. Culture data reviewed and tracheal aspirate gram stain reveals abundant yeast and gram negative rods. Given worsening clinical presentation decision was made to add antifungal coverage and vancomycin to antimicrobial therapies.  Spoke to Surgery team and decision was made to repeat ABD with oral contrast to assess for bowel ischemia.  Spouse called and updated on patients decline and would like to continue aggressive measures at this time. Family was encouraged to coordinate with clinical team to determine best time for plan of care meeting.   Thomas Eltringham D. Kenton Kingfisher, NP-C [facility] Personal contact information can be found on Amion  12/08/2020, 1:28 PM

## 2020-12-09 ENCOUNTER — Inpatient Hospital Stay (HOSPITAL_COMMUNITY): Payer: Medicare HMO

## 2020-12-09 ENCOUNTER — Other Ambulatory Visit (HOSPITAL_COMMUNITY): Payer: Self-pay

## 2020-12-09 DIAGNOSIS — Z7189 Other specified counseling: Secondary | ICD-10-CM

## 2020-12-09 DIAGNOSIS — N179 Acute kidney failure, unspecified: Secondary | ICD-10-CM | POA: Diagnosis not present

## 2020-12-09 DIAGNOSIS — J9601 Acute respiratory failure with hypoxia: Secondary | ICD-10-CM | POA: Diagnosis not present

## 2020-12-09 DIAGNOSIS — E1100 Type 2 diabetes mellitus with hyperosmolarity without nonketotic hyperglycemic-hyperosmolar coma (NKHHC): Secondary | ICD-10-CM | POA: Diagnosis not present

## 2020-12-09 DIAGNOSIS — R6521 Severe sepsis with septic shock: Secondary | ICD-10-CM | POA: Diagnosis not present

## 2020-12-09 DIAGNOSIS — A419 Sepsis, unspecified organism: Secondary | ICD-10-CM | POA: Diagnosis not present

## 2020-12-09 DIAGNOSIS — R1084 Generalized abdominal pain: Secondary | ICD-10-CM

## 2020-12-09 DIAGNOSIS — Z515 Encounter for palliative care: Secondary | ICD-10-CM

## 2020-12-09 LAB — APTT
aPTT: >200 seconds (ref 24–36)
aPTT: >200 seconds (ref 24–36)

## 2020-12-09 LAB — PROTIME-INR
INR: 1.7 — ABNORMAL HIGH (ref 0.8–1.2)
Prothrombin Time: 20.4 seconds — ABNORMAL HIGH (ref 11.4–15.2)

## 2020-12-09 LAB — GLUCOSE, CAPILLARY
Glucose-Capillary: 119 mg/dL — ABNORMAL HIGH (ref 70–99)
Glucose-Capillary: 121 mg/dL — ABNORMAL HIGH (ref 70–99)
Glucose-Capillary: 129 mg/dL — ABNORMAL HIGH (ref 70–99)
Glucose-Capillary: 135 mg/dL — ABNORMAL HIGH (ref 70–99)
Glucose-Capillary: 140 mg/dL — ABNORMAL HIGH (ref 70–99)
Glucose-Capillary: 141 mg/dL — ABNORMAL HIGH (ref 70–99)

## 2020-12-09 LAB — CULTURE, RESPIRATORY W GRAM STAIN

## 2020-12-09 LAB — RENAL FUNCTION PANEL
Albumin: 1.8 g/dL — ABNORMAL LOW (ref 3.5–5.0)
Albumin: 1.7 g/dL — ABNORMAL LOW (ref 3.5–5.0)
Anion gap: 10 (ref 5–15)
Anion gap: 13 (ref 5–15)
BUN: 31 mg/dL — ABNORMAL HIGH (ref 8–23)
BUN: 25 mg/dL — ABNORMAL HIGH (ref 8–23)
CO2: 20 mmol/L — ABNORMAL LOW (ref 22–32)
CO2: 19 mmol/L — ABNORMAL LOW (ref 22–32)
Calcium: 7 mg/dL — ABNORMAL LOW (ref 8.9–10.3)
Calcium: 6.9 mg/dL — ABNORMAL LOW (ref 8.9–10.3)
Chloride: 106 mmol/L (ref 98–111)
Chloride: 103 mmol/L (ref 98–111)
Creatinine, Ser: 2.06 mg/dL — ABNORMAL HIGH (ref 0.61–1.24)
Creatinine, Ser: 1.9 mg/dL — ABNORMAL HIGH (ref 0.61–1.24)
GFR, Estimated: 35 mL/min — ABNORMAL LOW (ref >60)
GFR, Estimated: 39 mL/min — ABNORMAL LOW (ref >60)
Glucose, Bld: 129 mg/dL — ABNORMAL HIGH (ref 70–99)
Glucose, Bld: 130 mg/dL — ABNORMAL HIGH (ref 70–99)
Phosphorus: 2.6 mg/dL (ref 2.5–4.6)
Phosphorus: 2.2 mg/dL — ABNORMAL LOW (ref 2.5–4.6)
Potassium: 4.5 mmol/L (ref 3.5–5.1)
Potassium: 4.1 mmol/L (ref 3.5–5.1)
Sodium: 136 mmol/L (ref 135–145)
Sodium: 135 mmol/L (ref 135–145)

## 2020-12-09 LAB — CBC
HCT: 28.6 % — ABNORMAL LOW (ref 39.0–52.0)
Hemoglobin: 10.1 g/dL — ABNORMAL LOW (ref 13.0–17.0)
MCH: 27.1 pg (ref 26.0–34.0)
MCHC: 35.3 g/dL (ref 30.0–36.0)
MCV: 76.7 fL — ABNORMAL LOW (ref 80.0–100.0)
Platelets: 161 10*3/uL (ref 150–400)
RBC: 3.73 MIL/uL — ABNORMAL LOW (ref 4.22–5.81)
RDW: 22.9 % — ABNORMAL HIGH (ref 11.5–15.5)
WBC: 17 10*3/uL — ABNORMAL HIGH (ref 4.0–10.5)
nRBC: 6.4 % — ABNORMAL HIGH (ref 0.0–0.2)

## 2020-12-09 LAB — HEPATIC FUNCTION PANEL
ALT: 23 U/L (ref 0–44)
AST: 46 U/L — ABNORMAL HIGH (ref 15–41)
Albumin: 1.7 g/dL — ABNORMAL LOW (ref 3.5–5.0)
Alkaline Phosphatase: 74 U/L (ref 38–126)
Bilirubin, Direct: 0.8 mg/dL — ABNORMAL HIGH (ref 0.0–0.2)
Indirect Bilirubin: 1.2 mg/dL — ABNORMAL HIGH (ref 0.3–0.9)
Total Bilirubin: 2 mg/dL — ABNORMAL HIGH (ref 0.3–1.2)
Total Protein: 5.4 g/dL — ABNORMAL LOW (ref 6.5–8.1)

## 2020-12-09 LAB — GASTROINTESTINAL PANEL BY PCR, STOOL (REPLACES STOOL CULTURE)

## 2020-12-09 LAB — MAGNESIUM: Magnesium: 2.3 mg/dL (ref 1.7–2.4)

## 2020-12-09 LAB — HEPARIN LEVEL (UNFRACTIONATED): Heparin Unfractionated: >1.1 IU/mL — ABNORMAL HIGH (ref 0.30–0.70)

## 2020-12-09 LAB — LACTIC ACID, PLASMA: Lactic Acid, Venous: 2.6 mmol/L (ref 0.5–1.9)

## 2020-12-09 LAB — TSH: TSH: 2.568 u[IU]/mL (ref 0.350–4.500)

## 2020-12-09 MED ORDER — HEPARIN (PORCINE) 25000 UT/250ML-% IV SOLN
1000.0000 [IU]/h | INTRAVENOUS | Status: DC
  Administered 2020-12-09: 1000 [IU]/h via INTRAVENOUS

## 2020-12-09 MED ORDER — METHOCARBAMOL 1000 MG/10ML IJ SOLN
500.0000 mg | Freq: Once | INTRAVENOUS | Status: AC
  Administered 2020-12-09: 500 mg via INTRAVENOUS
  Filled 2020-12-09: qty 500

## 2020-12-09 MED ORDER — STERILE WATER FOR INJECTION IJ SOLN
INTRAMUSCULAR | Status: AC
  Administered 2020-12-09: 1 mL
  Filled 2020-12-09: qty 10

## 2020-12-09 MED ORDER — METHOCARBAMOL 1000 MG/10ML IJ SOLN
500.0000 mg | Freq: Four times a day (QID) | INTRAVENOUS | Status: AC | PRN
  Administered 2020-12-10 – 2020-12-11 (×4): 500 mg via INTRAVENOUS
  Filled 2020-12-09 (×2): qty 500
  Filled 2020-12-09: qty 5
  Filled 2020-12-09 (×2): qty 500
  Filled 2020-12-09 (×2): qty 5

## 2020-12-09 NOTE — Progress Notes (Signed)
Subjective:  CRRT running without incident  BP overall better-  have decreased pressor dosing-  was positive 2.8 liters with CRRT but now able to run even- fio2 down to 60.  New abd CT-  ? Ischemic colitis-  no operative intervention planned   Objective Vital signs in last 24 hours: Vitals:   12/09/20 1200 12/09/20 1300 12/09/20 1329 12/09/20 1400  BP: (!) 108/53 (!) 103/54  (!) 106/59  Pulse: 80 81 81 81  Resp: (!) 26 (!) 26 (!) 23 (!) 26  Temp: 98.8 F (37.1 C)     TempSrc: Axillary     SpO2: 92% 92% 97% 97%  Weight:      Height:       Weight change: 0.2 kg  Intake/Output Summary (Last 24 hours) at 12/09/2020 1432 Last data filed at 12/09/2020 1400 Gross per 24 hour  Intake 3754.27 ml  Output 2289.5 ml  Net 1464.77 ml    Assessment/ Plan: Pt is a 65 y.o. yo male with DM, epilepsy and also colon CA- s/p chemo who was admitted on 11/30/2020 with Afib with RVR with MSOF in the setting of aspiration PNA-  complicated by AKI Assessment/Plan: 1. Renal-  baseline crt 1.5 to 1.6 A on CRF in the setting of MSOF-  CRRT initiated on 7/31- running well.  Essentially no UOP-  need to continue to support with CRRT-  K 4.5-   phos now 2.6-  may need to replete soon-  no change to CRRT prescription  2. HTN/volume-  have had to run positive due to hypotension and increased pressor requirements-  not massively overloaded-  will try to pull if gets more stable-- now to running even-  will keep 3. Pulm-  VDRF-  aspiration PNA-  maxepime /vanc 4. Anemia- supportive care-  given 2 units PRBC 7/31-- hgb much improved 5. Acidosis-  improved with PH over 7.4-   bicarb climbing-  I am going to continue the dialysate - think it will continue with the bicarb based dialysate.  Lactate decreasing 6. Afib-  on amio-  pretty well rate controlled 7.  Dispo-  critically ill and has baseline cancer-  family meeting planned for today   Thomas [name]    Labs: Basic Metabolic Panel: Recent Labs  Lab  12/08/20 0405 12/08/20 1521 12/09/20 0416  NA 140 139 136  K 4.7 4.8 4.5  CL 107 108 106  CO2 16* 19* 20*  GLUCOSE 146* 114* 129*  BUN 55* 37* 31*  CREATININE 3.24* 2.45* 2.06*  CALCIUM 7.5* 7.2* 7.0*  PHOS 4.2  3.9 3.1 2.6   Liver Function Tests: Recent Labs  Lab 12/08/20 1521 12/09/20 0416 12/09/20 0916  AST 43*  --  46*  ALT 23  --  23  ALKPHOS 61  --  74  BILITOT 2.1*  --  2.0*  PROT 5.4*  --  5.4*  ALBUMIN 1.8*  1.8* 1.8* 1.7*   No results for input(s): LIPASE, AMYLASE in the last 168 hours. No results for input(s): AMMONIA in the last 168 hours. CBC: Recent Labs  Lab 12/05/20 0442 12/06/20 0437 12/06/20 2119 12/07/20 0932 12/08/20 0730 12/09/20 0416  WBC 4.4 4.7 6.1 7.5  --  17.0*  NEUTROABS 1.8 2.0 3.9  --   --   --   HGB 9.6* 8.6* 8.8* 7.6* 10.4* 10.1*  HCT 27.6* 25.2* 25.6* 21.5* 28.9* 28.6*  MCV 77.5* 78.8* 76.9* 74.7*  --  76.7*  PLT 264 245 295 242  --  161   Cardiac Enzymes: No results for input(s): CKTOTAL, CKMB, CKMBINDEX, TROPONINI in the last 168 hours. CBG: Recent Labs  Lab 12/08/20 2021 12/08/20 2310 12/09/20 0405 12/09/20 0803 12/09/20 1121  GLUCAP 146* 139* 129* 119* 121*    Iron Studies: No results for input(s): IRON, TIBC, TRANSFERRIN, FERRITIN in the last 72 hours. Studies/Results: CT ABDOMEN PELVIS WO CONTRAST  Result Date: 12/08/2020 CLINICAL DATA:  Abdominal distension, decreasing colostomy output, history of colon cancer status post transverse colectomy EXAM: CT ABDOMEN AND PELVIS WITHOUT CONTRAST TECHNIQUE: Multidetector CT imaging of the abdomen and pelvis was performed following the standard protocol without IV contrast. Oral enteric contrast was administered COMPARISON:  CT chest abdomen pelvis, 12/01/2020 FINDINGS: Lower chest: There is new, dense heterogeneous and consolidative airspace opacity of the right lower and right middle lobes (series 7, image 1). Coronary artery calcifications. Aortic valve calcifications  Hepatobiliary: No solid liver abnormality is seen. Hepatic steatosis. No gallstones, gallbladder wall thickening, or biliary dilatation. Pancreas: Unremarkable. No pancreatic ductal dilatation or surrounding inflammatory changes. Spleen: Normal in size without significant abnormality. Adrenals/Urinary Tract: Adrenal glands are unremarkable. Kidneys are normal, without renal calculi, solid lesion, or hydronephrosis. Bladder is unremarkable. Stomach/Bowel: Esophagogastric tube is position with tip and side port below the diaphragm, however folded in the gastric body, with tip in the gastric fundus (series 2, image 17). 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 The proximal colon as well as the excluded colonic remnant are fluid-filled, and there is inflammatory wall thickening and fat stranding about the descending and sigmoid colon (series 2, image 78). Vascular/Lymphatic: Left femoral venous and arterial catheters. Aortic atherosclerosis. Incidental note of retroaortic left renal vein. No enlarged abdominal or pelvic lymph nodes. Reproductive: No mass or other significant abnormality. Other: Mild anasarca. Fat containing left inguinal hernia. No abdominopelvic ascites. Musculoskeletal: No acute or significant osseous findings. IMPRESSION: 1. 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 2. The proximal colon as well as the excluded colonic remnant are fluid-filled, and there is inflammatory wall thickening and fat stranding about the descending and sigmoid colon. Findings are new compared to prior CT dated 12/01/2020, consistent with nonspecific infectious, inflammatory, or ischemic colitis. 3. There is new, dense heterogeneous and consolidative airspace opacity of the right lower and right middle lobes, consistent with infection or aspiration. 4. Esophagogastric tube is positioned with tip and side port below the diaphragm, however folded in the gastric body, with  tip in the gastric fundus. Recommend repositioning. 5. Hepatic steatosis. 6. Coronary artery disease. Aortic Atherosclerosis (ICD10-I70.0). Electronically Signed   By: Eddie Candle M.D.   On: 12/08/2020 17:45   DG CHEST PORT 1 VIEW  Result Date: 12/09/2020 CLINICAL DATA:  65 year old male, evaluate for pneumonia. EXAM: PORTABLE CHEST - 1 VIEW COMPARISON:  12/07/2020 FINDINGS: The patient is rotated to the right, limiting of diagnostic evaluation. Unchanged appearance of right chest wall, internal jugular vein approach Port-A-Cath with the catheter tip in the superior right atrium. Endotracheal tube in place with the tip in the inferior thoracic trachea, just above the carina. Gastric decompression tube courses off the inferior aspect of this image, the tip is visualized in the fundus. The mediastinal contours are obscured, though similar to comparison limits. No cardiomegaly. Hazy opacities in the right lung base, limited evaluation due to projection. No acute osseous abnormality. IMPRESSION: 1. Right lower lobe subsegmental atelectasis versus infiltrate. Limited evaluation due to patient rotation. Recommend attention on follow-up. 2. Support lines and tubes in  unchanged, appropriate positions. Electronically Signed   By: Ruthann Cancer MD   On: 12/09/2020 11:15   DG Chest Port 1 View  Result Date: 12/07/2020 CLINICAL DATA:  Oxygen desaturation. EXAM: PORTABLE CHEST 1 VIEW COMPARISON:  Radiograph yesterday.  CT 12/01/2020 FINDINGS: Endotracheal tube tip 2 cm from the carina. Enteric tube tip below the diaphragm not included in the field of view. Right chest port remains in place. Improved aeration of the right lung base with decreased volume loss. Residual patchy basilar opacity. The heart is normal in size. No pulmonary edema, large pleural effusion or pneumothorax. IMPRESSION: 1. Improved aeration of the right lung base with decreased volume loss since radiograph yesterday. Residual patchy right basilar  opacity. 2. Stable support apparatus. Electronically Signed   By: Keith Rake M.D.   On: 12/07/2020 22:12   Medications: Infusions:   prismasol BGK 4/2.5 400 mL/hr at 12/09/20 0553    prismasol BGK 4/2.5 200 mL/hr at 12/08/20 1817   sodium chloride 10 mL/hr at 12/09/20 1400   sodium chloride 10 mL/hr at 12/09/20 1400   amiodarone 30 mg/hr (12/09/20 1400)   ceFEPime (MAXIPIME) IV Stopped (12/09/20 1236)   famotidine (PEPCID) IV Stopped (12/09/20 0133)   fentaNYL infusion INTRAVENOUS 70 mcg/hr (12/09/20 1400)   heparin 10,000 units/ 20 mL infusion syringe 500 Units/hr (12/09/20 0452)   levETIRAcetam Stopped (12/09/20 6803)   metronidazole Stopped (12/09/20 1108)   midazolam 0.5 mg/hr (12/09/20 1400)   norepinephrine (LEVOPHED) Adult infusion 5 mcg/min (12/09/20 1400)   prismasol BGK 4/2.5 1,800 mL/hr at 12/09/20 1416   promethazine (PHENERGAN) injection (IM or IVPB)     thiamine injection Stopped (12/09/20 1040)   vasopressin Stopped (12/08/20 1034)    Scheduled Medications:  albuterol  2.5 mg Nebulization QID   chlorhexidine gluconate (MEDLINE KIT)  15 mL Mouth Rinse BID   Chlorhexidine Gluconate Cloth  6 each Topical Daily   feeding supplement (VITAL HIGH PROTEIN)  1,000 mL Per Tube Q24H   hydrocerin   Topical BID   hydrocortisone sod succinate (SOLU-CORTEF) inj  100 mg Intravenous Q8H   insulin aspart  1-3 Units Subcutaneous Q4H   mouth rinse  15 mL Mouth Rinse 10 times per day   OLANZapine  5 mg Intramuscular QHS    have reviewed scheduled and prn medications.  Physical Exam: General: sedated, intubated Heart: borderline tachy-  irreg  Lungs: CBS bilat Abdomen: obese-  ostomy in RLQ-  liquid stool Extremities: minimal dep edema Dialysis Access: left femoral HD cath  - 99 days old    12/09/2020,2:32 PM  LOS: 8 days

## 2020-12-09 NOTE — Plan of Care (Signed)
  Interdisciplinary Goals of Care Family Meeting   Date carried out:: 12/09/2020  Location of the meeting: Conference room  Member's involved: Nurse Practitioner, Bedside Registered Nurse, Social Worker, Family Member or next of kin, and Other: Public relations account executive or acting medical decision maker:   Spouse, Ellie Lunch  Discussion: We discussed goals of care for [facility] .    Code status: Full Code  Disposition: Continue current acute care  Time spent for the meeting: 35 minutes   Met with patient's spouse, children and multiple family members along with palliative care NP and bedside nurse.  The family was initially feeling frustrated as they felt that they were told he does not have an infection initially and then did not understand why he became so much worse.  We discussed the most recent findings on CT abdomen/pelvis and the fact that he is in multiorgan failure requiring CRRT and ventilator.  There are a couple of encouraging signs today including reduction in pressor requirement and lactic acid down-trending, but they voiced understand that he still has a long way to go and a guarded prognosis.  They feel very strongly that they would like him to remain full code  Otilio Carpen Gianina Olinde 12/09/2020, 4:27 PM

## 2020-12-09 NOTE — Progress Notes (Signed)
NAME:  Thomas [name], MRN:  [id number], DOB:  [birth year], LOS: 8 ADMISSION DATE:  11/30/2020, CONSULTATION DATE:  7/30 REFERRING MD:  Riccardo Dubin, CHIEF COMPLAINT:  Worsening hypoxia/dyspnea  History of Present Illness:  65 yo M admitted for worsening sepsis vs reaction to chemotherapy with high ostomy output and soft BP on 7/25. Has had issues during his hospitalization with recurrent aspirations and more recently worsening respiratory status.  Most recently pt developed worsening hypoxia and was requiring BIPAP w/ FIO2 of 100% and increased work of breathing. Unable to obtain ABG, CXR in AM of 7/30 showed some worsening concern of aspiration.   Who was admitted to intensive care and subsequently had worsening clinical status requiring intubation, CRRT, multiple pressors and pneumonia along with CT findings 8/1 concerning for ischemic bowel.  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  Hx of remote MCA stroke Asthma hx as a child Epilepsy on keppra GERD T2DM on metformin as outpt HTN HLD OSA  Significant Hospital Events:  7/27 Transferred to [facility], stopped abx as sepsis resolved/ruled out 7/30 AM Worsened AME, more hypoxic with worsening renal funciton, new aspiration on abx restarted 7/30 PM Pt with increased o2 requirement on 100% FIO2 on BIPAP, d/w family who wanted to proceed with intubation, pt intubated 8/1 Some issues with tachycardia and hypotension overnight. Reproted high ostomy output  8/2 down to 9 mcg of Levophed, however repeat CT abdomen/pelvis showed inflammatory wall thickening and fat stranding of the descending and sigmoid colon, new since prior, consistent with nonspecific infectious, inflammatory, or ischemic colitis.  Also new dense consolidative airspace opacity in the right lower and right middle  lobes  Micro: Resp viral panel 7/24 > neg covid/ flu BC  7/24  x 2 neg  MRSA  7/25 neg  UC   7/25  neg  C diff screeen 7/26  neg ET culture 7/31 > moderate yeast> GI panel  8/1>  Abx: Unasyn 7/30 >>7/31 Maxepime 7/31 >> Flagyl 7/31 >> Vancomycin 8/1>> Anidulafungin 8/1>>  Interim History / Subjective:   No acute issues overnight, pressor requirement down this morning.  1 L output from ostomy bag overnight.  Repeat CT abdomen pelvis on 8/1 showing colonic inflammatory changes possibly infectious or ischemic  Objective   Blood pressure 131/67, pulse 83, temperature 98.2 F (36.8 C), temperature source Axillary, resp. rate (!) 26, height 5\' 8"  (1.727 m), weight 118.8 kg, SpO2 97 %.    Vent Mode: PRVC FiO2 (%):  [50 %-100 %] 70 % Set Rate:  [26 bmp] 26 bmp Vt Set:  [600 mL] 600 mL PEEP:  [5 cmH20] 5 cmH20 Plateau Pressure:  [16 cmH20-20 cmH20] 20 cmH20   Intake/Output Summary (Last 24 hours) at 12/09/2020 0747 Last data filed at 12/09/2020 0700 Gross per 24 hour  Intake 4756.1 ml  Output 1934.5 ml  Net 2821.6 ml    Filed Weights   12/07/20 0500 12/08/20 0447 12/09/20 0414  Weight: 117 kg 118.6 kg 118.8 kg   General: Critically ill-appearing male, intubated and sedated HEENT: MM pink/moist, sclera nonicteric, pupils equal Neuro: Examined on fentanyl gtt., patient withdraws to pain, resists eye opening, positive gag, triggering breaths on vent, spontaneously moving all extremities CV: s1s2 RRR, no m/r/g PULM: Decreased air movement bilateral bases, no rhonchi or wheezing, on PRVC GI: soft, bsx4 active  Extremities: warm/dry, no edema  Skin:  no rashes or lesions   Resolved Hospital Problem list     Assessment & Plan:  Acute hypoxic respiratory failure 2/2 Aspiration pneumonitis and RLL atx 7/30 - pt noted to have significant amount of thick dried secretions in posterior oropharynx.  P: -Maintain full vent support with SAT/SBT as tolerated -titrate Vent setting to  maintain SpO2 greater than or equal to 90%. -HOB elevated 30 degrees. -Plateau pressures less than 30 cm H20, driving pressure <24MW H2O -Follow chest x-ray, ABG prn.   -Bronchial hygiene and RT/bronchodilator protocol. -Respiratory culture growing yeast, speciation pending, started on Eraxis 8/1   Septic shock, likely secondary to colitis and right sided pneumonia In the setting of recurrent aspiration and developing inflammatory changes in the descending and sigmoid colon Pressor requirement is downtrending this morning -Procalcitonin 0.45 > 32.28 > 30.55  > 25.09 P: -Continue pressor support for MAP greater than 65 -Remains critically ill in the ICU -Continue broad-spectrum antibiotics with Flagyl, cefepime and vancomycin -Surgery following -Trend lactic acid -Guarded prognosis, palliative care involved plan for family meeting later today -Unable to tolerate tube feeds, may require TPN   Oliguric AKI on CKD stage 3a w/ hyperkalemia NAGMA Continued extremely minimal urine output Likely ATN in the setting of shock P: -Appreciate nephrology assistance, continue CRRT -Monitor electrolytes, follow urine output and metabolic panel -Pressors to maintain adequate renal perfusion -Avoid nephrotoxins     New onset Atrial fibrillation with RVR, HFpEF -Echocardiogram diastolic dysfunction and EF 60 to 65% P: -Controlled on amiodarone -Replete mag as needed -Eliquis was initially started this admission, changed to heparin as patient was unable to tolerate p.o.'s  however PTT severely elevated yesterday, checking PT/INR and liver function  -Check TSH    Colon cancer, s/p colostomy with recent course of chemo c/b diarrhea.  Systemic chemotherapy with CAPOX status post 3 cycles last cycle was giving November 19, 2020 with a reduced dose capecitabine and oxaliplatin P: -Heme-onc following, continue supportive care, per notes are discussing palliative care with family    T2DM w/  hyperglycemia  -Home medications include Metformin  P: -Continue SSI and every 4 hours CBG  Hx of Seizures  -EEG previously with encephalopathy but no active seizures Concern for evolving metabolic encephalopathy Hx of prior stoke  -left ischemic stroke 2015 P: -Neurology has evaluated during this admission, Continue Keppra and seizure precautions -Maintain neuroprotective measures including U thermia, euglycemia, U natremia, normoxia    Obesity class 2/ moderate calorie protein malnutrition.  P: -May need TPN  Stage 2 coccyx pressure ulcer (present on admission) P: Pressure alleviating devices  Wound care   Hx of HTN/HLD -Home medication includes: Norvasc, Lipitor,  P: Home medications remains on hold   Best Practice  Diet/type: NPO DVT prophylaxis: SCD GI prophylaxis: H2B Lines: Port [place]:  Yes, and it is still needed Code Status:  full code Last date of goals of care discussion : Pending later today, 8/2   CRITICAL CARE Performed by: Otilio Carpen Burech Mcfarland   Total critical care time: 35 minutes  Critical care time was exclusive of separately billable procedures and treating other patients.  Critical care was necessary to treat or prevent imminent or life-threatening deterioration.  Critical care was time spent personally by me on the following activities: development of treatment plan with patient and/or surrogate as well as nursing, discussions with consultants, evaluation of patient's response to treatment, examination of patient, obtaining history from patient or surrogate, ordering and performing treatments and interventions, ordering and review  of laboratory studies, ordering and review of radiographic studies, pulse oximetry and re-evaluation of patient's condition.  Otilio Carpen Frandy Basnett, PA-C [facility] See Amion for pager If no response to pager , please call [phone] until 7pm After 7:00 pm call Elink  [phone]

## 2020-12-09 NOTE — Progress Notes (Addendum)
Palliative:  HPI: 65 yo male with PMH significant for stage 3b colon cancer, L MCA stroke, frontal brain tumor, seizures, HTN, HLD, sleep apnea, aspiration pneumonia, diabetes admitted 11/30/2020 with abd pain, anorexia, and weakness. Hospitalization complicated by septic shock of uncertain origin (potentially likely colitis vs pneumonia) leading to need for intubation, vasopressors, CRRT. His condition remains tenuous in ICU.   I met today along with RN and Mickel Baas Gleason, PA with Mr. Pitre family (wife, 2 daughters, 2 brothers, and 1 sister via speaker phone). We reviewed Mr. Fofana's illness and hospital trajectory up to this point. There were many miscommunications and confusions regarding his condition that were able to be clarified during discussion. Wife, Jerlyn Ly, reports that family remain hopeful and have decided they wish to maintain full code and are not interested in consideration of DNR status. Seems that discussion regarding code status has come up on multiple occasions and Mahalia expresses their firm belief that DNR is their wish based on her previous conversations with her husband prior to intubation. I explained that we will respect this decision. I further reiterated goal of meeting is to ensure that medical team and family remain on the same page with information so that expectations and plan of care remain clear. Family understand today that there have been some improvement in vasopressor requirements and lactic acid but that Mr. Cullars remains critically ill and very far from where he needs to be medically to be able to improve and thrive. We encouraged family to continue open communication with [place] and each other so we continue to stay on the same page. I explain that as we gain more information (as we did with abd CT scan yesterday) and Mr. Dymek progresses that this alters our decisions and expectations which naturally may change over time. Family voice understanding and appreciation of  meeting and feel they have better understanding of Mr. Limpert's overall medical situation and condition.   All questions/concerns addressed. Emotional support provided. Appreciate assistance from PCCM PA [facility] Gleason.   Exam: Critically ill. Sedated on vent 50% FiO2. No distress. Still requiring smaller dose of Levophed 5 mcg/hr. Tolerating CRRT. Generalized edema.   Plan: - Family clear on goals for full code, full scope care at this time.  - Palliative will continue to follow and support.   [place], NP Palliative Medicine Team Pager [phone] (Please see [email or web address] for schedule) Team Phone [phone]    Greater than 50%  of this time was spent counseling and coordinating care related to the above assessment and plan

## 2020-12-09 NOTE — Progress Notes (Signed)
Progress Note     Subjective: Patient remains intubated but is trying to reach for ETT some. Pressor requirement decreasing. Started on steroids overnight. No further rectal discharge and colostomy output decreased. Remains on CRRT and not making much urine. GI panel pending.   Objective: Vital signs in last 24 hours: Temp:  [98.2 F (36.8 C)-99.8 F (37.7 C)] 98.2 F (36.8 C) (08/02 0600) Pulse Rate:  [79-98] 83 (08/02 0715) Resp:  [20-29] 26 (08/02 0715) BP: (75-143)/(28-94) 131/67 (08/02 0700) SpO2:  [88 %-100 %] 96 % (08/02 0757) Arterial Line BP: (79-191)/(31-66) 121/55 (08/02 0715) FiO2 (%):  [50 %-100 %] 60 % (08/02 0757) Weight:  [175.1 kg] 118.8 kg (08/02 0414) Last BM Date: 12/09/20  Intake/Output from previous day: 08/01 0701 - 08/02 0700 In: 4756.1 [I.V.:1745.7; NG/GT:1000; IV Piggyback:2010.4] Out: 1934.5 [Urine:39; Stool:1233] Intake/Output this shift: Total I/O In: 34.4 [I.V.:34.4] Out: 12 [Urine:2; Stool:10]  PE: General: WD, obese male, who is intubated and sedated and appears critically ill  Heart: RRR in the 80s, +2 edema in BLE   Lungs: vent  Abd: soft, mild-moderate distention, midline surgical scar well healed, stoma pink and with liquid stool output, OGT clamped   Lab Results:  Recent Labs    12/07/20 0932 12/08/20 0730 12/09/20 0416  WBC 7.5  --  17.0*  HGB 7.6* 10.4* 10.1*  HCT 21.5* 28.9* 28.6*  PLT 242  --  161   BMET Recent Labs    12/08/20 1521 12/09/20 0416  NA 139 136  K 4.8 4.5  CL 108 106  CO2 19* 20*  GLUCOSE 114* 129*  BUN 37* 31*  CREATININE 2.45* 2.06*  CALCIUM 7.2* 7.0*   PT/INR No results for input(s): LABPROT, INR in the last 72 hours. CMP     Component Value Date/Time   NA 136 12/09/2020 0416   K 4.5 12/09/2020 0416   CL 106 12/09/2020 0416   CO2 20 (L) 12/09/2020 0416   GLUCOSE 129 (H) 12/09/2020 0416   BUN 31 (H) 12/09/2020 0416   CREATININE 2.06 (H) 12/09/2020 0416   CREATININE 1.83 (H)  11/19/2020 0914   CALCIUM 7.0 (L) 12/09/2020 0416   PROT 5.4 (L) 12/08/2020 1521   ALBUMIN 1.8 (L) 12/09/2020 0416   AST 43 (H) 12/08/2020 1521   AST 12 (L) 11/19/2020 0914   ALT 23 12/08/2020 1521   ALT 19 11/19/2020 0914   ALKPHOS 61 12/08/2020 1521   BILITOT 2.1 (H) 12/08/2020 1521   BILITOT 0.7 11/19/2020 0914   GFRNONAA 35 (L) 12/09/2020 0416   GFRNONAA 41 (L) 11/19/2020 0914   GFRAA >60 04/01/2017 1921   Lipase     Component Value Date/Time   LIPASE 24 12/01/2020 0018       Studies/Results: CT ABDOMEN PELVIS WO CONTRAST  Result Date: 12/08/2020 CLINICAL DATA:  Abdominal distension, decreasing colostomy output, history of colon cancer status post transverse colectomy EXAM: CT ABDOMEN AND PELVIS WITHOUT CONTRAST TECHNIQUE: Multidetector CT imaging of the abdomen and pelvis was performed following the standard protocol without IV contrast. Oral enteric contrast was administered COMPARISON:  CT chest abdomen pelvis, 12/01/2020 FINDINGS: Lower chest: There is new, dense heterogeneous and consolidative airspace opacity of the right lower and right middle lobes (series 7, image 1). Coronary artery calcifications. Aortic valve calcifications Hepatobiliary: No solid liver abnormality is seen. Hepatic steatosis. No gallstones, gallbladder wall thickening, or biliary dilatation. Pancreas: Unremarkable. No pancreatic ductal dilatation or surrounding inflammatory changes. Spleen: Normal in size without significant abnormality.  Adrenals/Urinary Tract: Adrenal glands are unremarkable. Kidneys are normal, without renal calculi, solid lesion, or hydronephrosis. Bladder is unremarkable. Stomach/Bowel: Esophagogastric tube is position with tip and side port below the diaphragm, however folded in the gastric body, with tip in the gastric fundus (series 2, image 17). 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 The proximal colon as well as the excluded colonic remnant are  fluid-filled, and there is inflammatory wall thickening and fat stranding about the descending and sigmoid colon (series 2, image 78). Vascular/Lymphatic: Left femoral venous and arterial catheters. Aortic atherosclerosis. Incidental note of retroaortic left renal vein. No enlarged abdominal or pelvic lymph nodes. Reproductive: No mass or other significant abnormality. Other: Mild anasarca. Fat containing left inguinal hernia. No abdominopelvic ascites. Musculoskeletal: No acute or significant osseous findings. IMPRESSION: 1. 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 2. The proximal colon as well as the excluded colonic remnant are fluid-filled, and there is inflammatory wall thickening and fat stranding about the descending and sigmoid colon. Findings are new compared to prior CT dated 12/01/2020, consistent with nonspecific infectious, inflammatory, or ischemic colitis. 3. There is new, dense heterogeneous and consolidative airspace opacity of the right lower and right middle lobes, consistent with infection or aspiration. 4. Esophagogastric tube is positioned with tip and side port below the diaphragm, however folded in the gastric body, with tip in the gastric fundus. Recommend repositioning. 5. Hepatic steatosis. 6. Coronary artery disease. Aortic Atherosclerosis (ICD10-I70.0). Electronically Signed   By: Eddie Candle M.D.   On: 12/08/2020 17:45   DG Chest Port 1 View  Result Date: 12/07/2020 CLINICAL DATA:  Oxygen desaturation. EXAM: PORTABLE CHEST 1 VIEW COMPARISON:  Radiograph yesterday.  CT 12/01/2020 FINDINGS: Endotracheal tube tip 2 cm from the carina. Enteric tube tip below the diaphragm not included in the field of view. Right chest port remains in place. Improved aeration of the right lung base with decreased volume loss. Residual patchy basilar opacity. The heart is normal in size. No pulmonary edema, large pleural effusion or pneumothorax. IMPRESSION: 1. Improved  aeration of the right lung base with decreased volume loss since radiograph yesterday. Residual patchy right basilar opacity. 2. Stable support apparatus. Electronically Signed   By: Keith Rake M.D.   On: 12/07/2020 22:12    Anti-infectives: Anti-infectives (From admission, onward)    Start     Dose/Rate Route Frequency Ordered Stop   12/09/20 1800  vancomycin (VANCOREADY) IVPB 1750 mg/350 mL        1,750 mg 175 mL/hr over 120 Minutes Intravenous Every 24 hours 12/08/20 1357     12/09/20 1700  anidulafungin (ERAXIS) 100 mg in sodium chloride 0.9 % 100 mL IVPB        100 mg 78 mL/hr over 100 Minutes Intravenous Every 24 hours 12/08/20 1359     12/08/20 1500  vancomycin (VANCOCIN) 2,500 mg in sodium chloride 0.9 % 500 mL IVPB        2,500 mg 250 mL/hr over 120 Minutes Intravenous  Once 12/08/20 1357 12/08/20 1719   12/08/20 1500  anidulafungin (ERAXIS) 200 mg in sodium chloride 0.9 % 200 mL IVPB        200 mg 78 mL/hr over 200 Minutes Intravenous  Once 12/08/20 1359 12/08/20 1821   12/08/20 0000  ceFEPIme (MAXIPIME) 2 g in sodium chloride 0.9 % 100 mL IVPB        2 g 200 mL/hr over 30 Minutes Intravenous Every 12 hours 12/07/20 1200  12/07/20 1000  metroNIDAZOLE (FLAGYL) IVPB 500 mg        500 mg 100 mL/hr over 60 Minutes Intravenous Every 12 hours 12/07/20 0849     12/07/20 0900  ceFEPIme (MAXIPIME) 2 g in sodium chloride 0.9 % 100 mL IVPB  Status:  Discontinued        2 g 200 mL/hr over 30 Minutes Intravenous Every 24 hours 12/07/20 0809 12/07/20 1200   12/07/20 0830  metroNIDAZOLE (FLAGYL) IVPB 500 mg  Status:  Discontinued        500 mg 100 mL/hr over 60 Minutes Intravenous Every 8 hours 12/07/20 0758 12/07/20 0849   12/06/20 0900  Ampicillin-Sulbactam (UNASYN) 3 g in sodium chloride 0.9 % 100 mL IVPB  Status:  Discontinued        3 g 200 mL/hr over 30 Minutes Intravenous Every 12 hours 12/06/20 0832 12/07/20 0757   12/03/20 0800  ceFEPIme (MAXIPIME) 2 g in sodium chloride  0.9 % 100 mL IVPB  Status:  Discontinued        2 g 200 mL/hr over 30 Minutes Intravenous Every 8 hours 12/03/20 0746 12/04/20 1004   12/01/20 1000  ceFEPIme (MAXIPIME) 2 g in sodium chloride 0.9 % 100 mL IVPB  Status:  Discontinued        2 g 200 mL/hr over 30 Minutes Intravenous Every 12 hours 12/01/20 0513 12/03/20 0746   12/01/20 0300  vancomycin (VANCOREADY) IVPB 2000 mg/400 mL        2,000 mg 200 mL/hr over 120 Minutes Intravenous  Once 12/01/20 0255 12/01/20 0528   11/30/20 2245  ceFEPIme (MAXIPIME) 2 g in sodium chloride 0.9 % 100 mL IVPB        2 g 200 mL/hr over 30 Minutes Intravenous STAT 11/30/20 2237 12/01/20 0003        Assessment/Plan Stage IIIb colon cancer S/p open partial colectomy with transverse colostomy 09/10/20 Dr. Bobbye Morton Colitis - POD91 - CT AP 8/1 with nonspecific colitis in both proximal colon and excluded colon - ischemic vs infectious vs inflammatory - GI panel pending, C. Diff negative - ostomy output has decreased significantly - would recommend TPN for nutritional support at this time, hold TF - no acute surgical indications    FEN: NPO, OGT - would not recommend TF at this time VTE: SQH, SCDs ID: [email or web address] 7/31>>, vanc/eraxis 8/1>>   - below per primary team - Sepsis vs reaction to chemotherapy with shock - pressor requirement decreased Acute hypoxic respiratory failure/fungal PNA - vent per PCCM, resp cxs with yeast AKI on CKD stage II - on CRRT A fib with RVR - on amio gtt Hx of MCA CVA Hx of asthma Hx of epilepsy on Keppra T2DM GERD HTN HLD OSA Obesity class II - BMI 39.76  LOS: 8 days    Norm Parcel, [facility] 12/09/2020, 8:50 AM Please see Amion for pager number during day hours 7:00am-4:30pm

## 2020-12-09 NOTE — Progress Notes (Signed)
HEMATOLOGY-ONCOLOGY PROGRESS NOTE  SUBJECTIVE: Thomas [name] remains on the ventilator.  The bedside RN reports the ostomy output has diminished.  He is requiring less pressor support. Oncology History  Colon cancer ([place])  09/23/2020 Initial Diagnosis   Colon cancer ([place])    09/23/2020 Cancer Staging   Staging form: Colon and Rectum, AJCC 8th Edition - Pathologic: Stage IIIB (pT3, pN1a, cM0) - Signed by Ladell Pier, MD on 09/23/2020  Total positive nodes: 1  Histologic grading system: 4 grade system  Histologic grade (G): G1  Residual tumor (R): R0 - None  Lymph-vascular invasion (LVI): LVI not present (absent)/not identified  Tumor deposits (TD): Absent  Perineural invasion (PNI): Absent  Microsatellite instability (MSI): Stable    10/08/2020 -  Chemotherapy    Patient is on Treatment Plan: COLORECTAL XELOX (CAPEOX) Q21D         PHYSICAL EXAMINATION:  Vitals:   12/09/20 1123 12/09/20 1200  BP:  (!) 108/53  Pulse: 80 80  Resp: (!) 26 (!) 26  Temp:  98.8 F (37.1 C)  SpO2: 95% 92%   Filed Weights   12/07/20 0500 12/08/20 0447 12/09/20 0414  Weight: 257 lb 15 oz (117 kg) 261 lb 7.5 oz (118.6 kg) 261 lb 14.5 oz (118.8 kg)    Intake/Output from previous day: 08/01 0701 - 08/02 0700 In: 4756.1 [I.V.:1745.7; NG/GT:1000; IV Piggyback:2010.4] Out: 1934.5 [Urine:39; Stool:1233]  GENERAL:a intubated, sedated SKIN: Dryness of the palms and soles, erythema with superficial desquamation at the hands and several fingers, superficial skin breakdown at the scrotum, hyperpigmentation of the hands-hyperpigmentation and skin thickening at the hands   OROPHARYNX: ETT in place  ABDOMEN: Right abdomen colostomy with loose, partially formed stool NEURO: Sedated  Port-A-Cath without erythema  LABORATORY DATA:  I have reviewed the data as listed CMP Latest Ref Rng & Units 12/09/2020 12/08/2020 12/08/2020  Glucose 70 - 99 mg/dL 129(H) 114(H) 146(H)  BUN 8 - 23 mg/dL 31(H) 37(H)  55(H)  Creatinine 0.61 - 1.24 mg/dL 2.06(H) 2.45(H) 3.24(H)  Sodium 135 - 145 mmol/L 136 139 140  Potassium 3.5 - 5.1 mmol/L 4.5 4.8 4.7  Chloride 98 - 111 mmol/L 106 108 107  CO2 22 - 32 mmol/L 20(L) 19(L) 16(L)  Calcium 8.9 - 10.3 mg/dL 7.0(L) 7.2(L) 7.5(L)  Total Protein 6.5 - 8.1 g/dL 5.4(L) 5.4(L) -  Total Bilirubin 0.3 - 1.2 mg/dL 2.0(H) 2.1(H) -  Alkaline Phos 38 - 126 U/L 74 61 -  AST 15 - 41 U/L 46(H) 43(H) -  ALT 0 - 44 U/L 23 23 -    Lab Results  Component Value Date   WBC 17.0 (H) 12/09/2020   HGB 10.1 (L) 12/09/2020   HCT 28.6 (L) 12/09/2020   MCV 76.7 (L) 12/09/2020   PLT 161 12/09/2020   NEUTROABS 3.9 12/06/2020    CT ABDOMEN PELVIS WO CONTRAST  Result Date: 12/08/2020 CLINICAL DATA:  Abdominal distension, decreasing colostomy output, history of colon cancer status post transverse colectomy EXAM: CT ABDOMEN AND PELVIS WITHOUT CONTRAST TECHNIQUE: Multidetector CT imaging of the abdomen and pelvis was performed following the standard protocol without IV contrast. Oral enteric contrast was administered COMPARISON:  CT chest abdomen pelvis, 12/01/2020 FINDINGS: Lower chest: There is new, dense heterogeneous and consolidative airspace opacity of the right lower and right middle lobes (series 7, image 1). Coronary artery calcifications. Aortic valve calcifications Hepatobiliary: No solid liver abnormality is seen. Hepatic steatosis. No gallstones, gallbladder wall thickening, or biliary dilatation. Pancreas: Unremarkable. No pancreatic ductal  dilatation or surrounding inflammatory changes. Spleen: Normal in size without significant abnormality. Adrenals/Urinary Tract: Adrenal glands are unremarkable. Kidneys are normal, without renal calculi, solid lesion, or hydronephrosis. Bladder is unremarkable. Stomach/Bowel: Esophagogastric tube is position with tip and side port below the diaphragm, however folded in the gastric body, with tip in the gastric fundus (series 2, image 17).  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 The proximal colon as well as the excluded colonic remnant are fluid-filled, and there is inflammatory wall thickening and fat stranding about the descending and sigmoid colon (series 2, image 78). Vascular/Lymphatic: Left femoral venous and arterial catheters. Aortic atherosclerosis. Incidental note of retroaortic left renal vein. No enlarged abdominal or pelvic lymph nodes. Reproductive: No mass or other significant abnormality. Other: Mild anasarca. Fat containing left inguinal hernia. No abdominopelvic ascites. Musculoskeletal: No acute or significant osseous findings. IMPRESSION: 1. 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 2. The proximal colon as well as the excluded colonic remnant are fluid-filled, and there is inflammatory wall thickening and fat stranding about the descending and sigmoid colon. Findings are new compared to prior CT dated 12/01/2020, consistent with nonspecific infectious, inflammatory, or ischemic colitis. 3. There is new, dense heterogeneous and consolidative airspace opacity of the right lower and right middle lobes, consistent with infection or aspiration. 4. Esophagogastric tube is positioned with tip and side port below the diaphragm, however folded in the gastric body, with tip in the gastric fundus. Recommend repositioning. 5. Hepatic steatosis. 6. Coronary artery disease. Aortic Atherosclerosis (ICD10-I70.0). Electronically Signed   By: Eddie Candle M.D.   On: 12/08/2020 17:45   DG Chest 1 View  Result Date: 12/06/2020 CLINICAL DATA:  Shortness of breath. Patient undergoing chemotherapy for stage IIIB colon cancer. EXAM: CHEST  1 VIEW COMPARISON:  December 04, 2020 FINDINGS: Platelike opacity in the left base consistent with atelectasis or scar, stable. New opacity in the right base obscuring the elevated right hemidiaphragm. The heart, hila, and mediastinum are unchanged and  unremarkable. The right Port-A-Cath is stable. No pulmonary nodules or masses. No other abnormalities. IMPRESSION: 1. Stable platelike opacity in the left base consistent with scar or atelectasis. 2. New opacity in the right base obscuring the elevated right hemidiaphragm. The opacity could represent atelectasis versus developing infiltrate. Recommend clinical correlation and short-term follow-up imaging to ensure resolution. Electronically Signed   By: Dorise Bullion III M.D   On: 12/06/2020 11:50   DG Abd 1 View  Result Date: 12/06/2020 CLINICAL DATA:  NG tube placement. EXAM: ABDOMEN - 1 VIEW COMPARISON:  CT 12/01/2020 FINDINGS: Tip and side port of the enteric tube below the diaphragm in the stomach. Generalized paucity of bowel gas in the upper abdomen. IMPRESSION: Tip and side port of the enteric tube below the diaphragm in the stomach. Electronically Signed   By: Keith Rake M.D.   On: 12/06/2020 23:37   CT HEAD WO CONTRAST  Result Date: 12/05/2020 CLINICAL DATA:  Acute neuro deficit, agitation and delirium, history of colon carcinoma EXAM: CT HEAD WITHOUT CONTRAST TECHNIQUE: Contiguous axial images were obtained from the base of the skull through the vertex without intravenous contrast. COMPARISON:  09/08/2016 and previous FINDINGS: Brain: Previous left frontal craniotomy. Stable encephalomalacia in left frontal lobe. Negative for acute intracranial hemorrhage, midline shift, no parenchymal edema, mass or mass effect. No hydrocephalus. Mild diffuse parenchymal atrophy as before. Vascular: Atherosclerotic and physiologic intracranial calcifications. Skull: Changes of left temporal craniotomy. No acute fracture or worrisome bone  lesion. Sinuses/Orbits: No acute finding. Other: None IMPRESSION: 1. Negative for bleed or other acute intracranial process. 2. Stable changes of left frontal craniotomy Electronically Signed   By: Lucrezia Europe M.D.   On: 12/05/2020 11:34   US RENAL  Result Date:  12/06/2020 CLINICAL DATA:  AK I EXAM: RENAL / URINARY TRACT ULTRASOUND COMPLETE COMPARISON:  Renal ultrasound 10/02/2020 FINDINGS: Right Kidney: Renal measurements: 9.8 x 6.3 x 5.9 cm = volume: 190 mL. Echogenicity within normal limits. No mass or hydronephrosis visualized. Left Kidney: Renal measurements: 11.9 x 7.2 x 5.8 cm = volume: 262 mL. Echogenicity within normal limits. No hydronephrosis. There is a cyst in the lateral aspect of the kidney measuring 4.5 x 4.0 x 4.0 cm. Bladder: Appears decompressed with Foley in place. Other: None. IMPRESSION: No acute abnormality sonographically in the kidneys. Exam is limited by difficulty with patient positioning and shadowing bowel gas. Electronically Signed   By: Audie Pinto M.D.   On: 12/06/2020 11:47   DG CHEST PORT 1 VIEW  Result Date: 12/09/2020 CLINICAL DATA:  65 year old male, evaluate for pneumonia. EXAM: PORTABLE CHEST - 1 VIEW COMPARISON:  12/07/2020 FINDINGS: The patient is rotated to the right, limiting of diagnostic evaluation. Unchanged appearance of right chest wall, internal jugular vein approach Port-A-Cath with the catheter tip in the superior right atrium. Endotracheal tube in place with the tip in the inferior thoracic trachea, just above the carina. Gastric decompression tube courses off the inferior aspect of this image, the tip is visualized in the fundus. The mediastinal contours are obscured, though similar to comparison limits. No cardiomegaly. Hazy opacities in the right lung base, limited evaluation due to projection. No acute osseous abnormality. IMPRESSION: 1. Right lower lobe subsegmental atelectasis versus infiltrate. Limited evaluation due to patient rotation. Recommend attention on follow-up. 2. Support lines and tubes in unchanged, appropriate positions. Electronically Signed   By: Ruthann Cancer MD   On: 12/09/2020 11:15   DG Chest Port 1 View  Result Date: 12/07/2020 CLINICAL DATA:  Oxygen desaturation. EXAM: PORTABLE  CHEST 1 VIEW COMPARISON:  Radiograph yesterday.  CT 12/01/2020 FINDINGS: Endotracheal tube tip 2 cm from the carina. Enteric tube tip below the diaphragm not included in the field of view. Right chest port remains in place. Improved aeration of the right lung base with decreased volume loss. Residual patchy basilar opacity. The heart is normal in size. No pulmonary edema, large pleural effusion or pneumothorax. IMPRESSION: 1. Improved aeration of the right lung base with decreased volume loss since radiograph yesterday. Residual patchy right basilar opacity. 2. Stable support apparatus. Electronically Signed   By: Keith Rake M.D.   On: 12/07/2020 22:12   DG CHEST PORT 1 VIEW  Result Date: 12/06/2020 CLINICAL DATA:  Intubation. EXAM: PORTABLE CHEST 1 VIEW COMPARISON:  Radiograph earlier today.  CT 12/01/2020 FINDINGS: Endotracheal tube tip 19 mm from the carina. Enteric tube in place with tip and side-port below the diaphragm. Right chest port unchanged in position. There is progressive volume loss in the right hemithorax with opacification of the lower thorax and rightward mediastinal shift. Improved linear opacity at the left lung base. No pneumothorax. IMPRESSION: 1. Endotracheal tube tip 19 mm from the carina. Enteric tube in place. 2. Progressive volume loss in the right hemithorax with opacification of the lower thorax and rightward mediastinal shift. Findings favor lobar collapse/atelectasis. Linear opacity at the left lung base is improved from earlier today. Electronically Signed   By: Aurther Loft.D.  On: 12/06/2020 23:40   DG CHEST PORT 1 VIEW  Result Date: 12/04/2020 CLINICAL DATA:  Vomiting with possible aspiration. EXAM: PORTABLE CHEST 1 VIEW COMPARISON:  November 30, 2020 FINDINGS: There is stable right-sided venous Port-A-Cath positioning. Mild linear atelectasis is seen within the mid left lung. This represents a new finding when compared to the prior exam. There is no evidence of a  pleural effusion or pneumothorax. The heart size and mediastinal contours are within normal limits. The visualized skeletal structures are unremarkable. IMPRESSION: Interval development of mild linear atelectasis within the mid left lung since the prior study. Electronically Signed   By: Virgina Norfolk M.D.   On: 12/04/2020 01:19   DG Chest Port 1 View  Result Date: 11/30/2020 CLINICAL DATA:  Questionable sepsis. Patient began treatment for colon cancer 3 months ago. Loss of appetite, severe abdominal pain, weakness, and back pain. EXAM: PORTABLE CHEST 1 VIEW COMPARISON:  11/27/2020 FINDINGS: Heart size and pulmonary vascularity are normal. Lungs clear. Power port type central venous catheter with tip over the low SVC region. No pneumothorax. No pleural effusions. Mediastinal contours appear intact. IMPRESSION: No active disease. Electronically Signed   By: Lucienne Capers M.D.   On: 11/30/2020 22:36   DG Chest Port 1 View  Result Date: 11/27/2020 CLINICAL DATA:  Weakness.  Fall today. EXAM: PORTABLE CHEST 1 VIEW COMPARISON:  Radiograph 11/15/2020 FINDINGS: Right chest port unchanged in position. Normal heart size and mediastinal contours. No acute or focal airspace disease no pleural fluid, pulmonary edema, or pneumothorax. No acute osseous abnormalities are seen. IMPRESSION: No acute chest findings. Electronically Signed   By: Keith Rake M.D.   On: 11/27/2020 22:09   DG Chest Portable 1 View  Result Date: 11/15/2020 CLINICAL DATA:  Fatigue, colon cancer EXAM: PORTABLE CHEST 1 VIEW COMPARISON:  10/01/2020 FINDINGS: Right Port-A-Cath remains in place, unchanged. Heart and mediastinal contours are within normal limits. No focal opacities or effusions. No acute bony abnormality. IMPRESSION: No active disease. Electronically Signed   By: Rolm Baptise M.D.   On: 11/15/2020 14:34   EEG adult  Result Date: 12/05/2020 Lora Havens, MD     12/05/2020  6:19 PM Patient Name: ATREYU MAK MRN:  [id number] Epilepsy Attending: Lora Havens Referring Physician/Provider: Dr Sander Radon Date: 12/05/2020 Duration: 23.18 mins Patient history: 65yo M withh/o epilepsy presented with ams. EEG to evaluate for seizure Level of alertness: Awake AEDs during EEG study: LEV Technical aspects: This EEG study was done with scalp electrodes positioned according to the 10-20 International system of electrode placement. Electrical activity was acquired at a sampling rate of _0  and reviewed with a high frequency filter of _1  and a low frequency filter of _2 . EEG data were recorded continuously and digitally stored. Description: No posterior dominant rhythm was seen. EEG showed continuous generalized 3 to 6 Hz theta-delta slowing. There is also 3-_3  sharply contoured theta-delta slowing with overriding 13-_4  beta activity in left frontotemporal region consistent with breach artifact.  Hyperventilation and photic stimulation were not performed.   ABNORMALITY - Continuous slow, generalized - Breach artifact, left frontotemporal region IMPRESSION: This study is suggestive of cortical dysfunction arising from left frontotemporal region likely secondary to underlying encephalomalacia as well as prior craniotomy. There is also moderate diffuse encephalopathy, nonspecific etiology. No seizures or definite epileptiform discharges were seen throughout the recording. Lora Havens   ECHOCARDIOGRAM COMPLETE  Result Date: 12/02/2020    ECHOCARDIOGRAM REPORT   Patient Name:   ELIZANDRO LAURA  Meara Date of Exam: 12/02/2020 Medical Rec #:  [id number]    Height:       68.0 in Accession #:    [id number]   Weight:       246.5 lb Date of Birth:  June 24, 1955    BSA:          2.234 m Patient Age:    65 years     BP:           136/77 mmHg Patient Gender: M            HR:           92 bpm. Exam Location:  Inpatient Procedure: 2D Echo, Color Doppler and Cardiac Doppler Indications:    I48.91* Unspecified atrial fibrillation  History:         Patient has no prior history of Echocardiogram examinations.                 Arrythmias:Atrial Fibrillation; Risk Factors:Hypertension,                 Diabetes, Dyslipidemia and Sleep Apnea.  Sonographer:    Raquel Sarna Senior RDCS Referring Phys: [phone] [facility]  Sonographer Comments: Technically difficult due to patient body habitus. IMPRESSIONS  1. Mild intracavitary gradient. Peak velocity 1.37 m/s. Peak gradient 7.5 mmHg. Left ventricular ejection fraction, by estimation, is 60 to 65%. The left ventricle has normal function. The left ventricle has no regional wall motion abnormalities. There is moderate concentric left ventricular hypertrophy. Left ventricular diastolic parameters are consistent with Grade I diastolic dysfunction (impaired relaxation).  2. Right ventricular systolic function is normal. The right ventricular size is normal. There is normal pulmonary artery systolic pressure.  3. The mitral valve is normal in structure. No evidence of mitral valve regurgitation. No evidence of mitral stenosis.  4. The aortic valve is calcified. There is moderate calcification of the aortic valve. Aortic valve regurgitation is not visualized. No aortic stenosis is present.  5. The inferior vena cava is normal in size with greater than 50% respiratory variability, suggesting right atrial pressure of 3 mmHg. FINDINGS  Left Ventricle: Mild intracavitary gradient. Peak velocity 1.37 m/s. Peak gradient 7.5 mmHg. Left ventricular ejection fraction, by estimation, is 60 to 65%. The left ventricle has normal function. The left ventricle has no regional wall motion abnormalities. The left ventricular internal cavity size was normal in size. There is moderate concentric left ventricular hypertrophy. Left ventricular diastolic parameters are consistent with Grade I diastolic dysfunction (impaired relaxation). Indeterminate filling pressures. Right Ventricle: The right ventricular size is normal. No increase in right  ventricular wall thickness. Right ventricular systolic function is normal. There is normal pulmonary artery systolic pressure. The tricuspid regurgitant velocity is 1.72 m/s, and  with an assumed right atrial pressure of 8 mmHg, the estimated right ventricular systolic pressure is 62.1 mmHg. Left Atrium: Left atrial size was normal in size. Right Atrium: Right atrial size was normal in size. Pericardium: There is no evidence of pericardial effusion. Mitral Valve: The mitral valve is normal in structure. No evidence of mitral valve regurgitation. No evidence of mitral valve stenosis. Tricuspid Valve: The tricuspid valve is normal in structure. Tricuspid valve regurgitation is trivial. No evidence of tricuspid stenosis. Aortic Valve: The aortic valve is calcified. There is moderate calcification of the aortic valve. Aortic valve regurgitation is not visualized. No aortic stenosis is present. Pulmonic Valve: The pulmonic valve was normal in structure. Pulmonic valve regurgitation is not visualized. No evidence of  pulmonic stenosis. Aorta: The aortic root is normal in size and structure. Venous: The inferior vena cava is normal in size with greater than 50% respiratory variability, suggesting right atrial pressure of 3 mmHg. IAS/Shunts: No atrial level shunt detected by color flow Doppler.  LEFT VENTRICLE PLAX 2D LVIDd:         3.20 cm  Diastology LVIDs:         2.10 cm  LV e' medial:    4.68 cm/s LV PW:         1.30 cm  LV E/e' medial:  11.5 LV IVS:        1.50 cm  LV e' lateral:   6.85 cm/s LVOT diam:     2.00 cm  LV E/e' lateral: 7.9 LV SV:         43 LV SV Index:   19 LVOT Area:     3.14 cm  RIGHT VENTRICLE RV S prime:     13.50 cm/s TAPSE (M-mode): 1.6 cm LEFT ATRIUM             Index       RIGHT ATRIUM           Index LA diam:        3.20 cm 1.43 cm/m  RA Area:     12.50 cm LA Vol (A2C):   46.5 ml 20.82 ml/m RA Volume:   26.60 ml  11.91 ml/m LA Vol (A4C):   33.6 ml 15.04 ml/m LA Biplane Vol: 41.5 ml 18.58  ml/m  AORTIC VALVE LVOT Vmax:   98.65 cm/s LVOT Vmean:  79.600 cm/s LVOT VTI:    0.138 m  AORTA Ao Root diam: 3.10 cm MITRAL VALVE                TRICUSPID VALVE MV Area (PHT): 3.10 cm     TR Peak grad:   11.8 mmHg MV Decel Time: 245 msec     TR Vmax:        172.00 cm/s MV E velocity: 54.00 cm/s MV A velocity: 102.00 cm/s  SHUNTS MV E/A ratio:  0.53         Systemic VTI:  0.14 m                             Systemic Diam: 2.00 cm Skeet Latch MD Electronically signed by Skeet Latch MD Signature Date/Time: 12/02/2020/5:47:16 PM    Final    CT CHEST ABDOMEN PELVIS WO CONTRAST  Result Date: 12/01/2020 CLINICAL DATA:  Abdominal pain and fever EXAM: CT CHEST, ABDOMEN AND PELVIS WITHOUT CONTRAST TECHNIQUE: Multidetector CT imaging of the chest, abdomen and pelvis was performed following the standard protocol without IV contrast. COMPARISON:  09/04/2020 FINDINGS: CT CHEST FINDINGS Cardiovascular: Calcific atherosclerosis of the aorta and coronary arteries. No pericardial effusion. Course and caliber of the aorta are normal. Right chest wall Port-A-Cath tip is at the cavoatrial junction. Mediastinum/Nodes: No enlarged mediastinal, hilar, or axillary lymph nodes. Thyroid gland, trachea, and esophagus demonstrate no significant findings. Lungs/Pleura: Lungs are clear. No pleural effusion or pneumothorax. Musculoskeletal: No chest wall mass or suspicious bone lesions identified. CT ABDOMEN PELVIS FINDINGS Hepatobiliary: No focal liver abnormality is seen. No gallstones, gallbladder wall thickening, or biliary dilatation. Pancreas: Unremarkable. No pancreatic ductal dilatation or surrounding inflammatory changes. Spleen: Normal in size without focal abnormality. Adrenals/Urinary Tract: Adrenal glands are unremarkable. Kidneys are normal, without renal calculi, focal lesion, or hydronephrosis. Bladder  is unremarkable. 3.5 cm left renal cyst. Stomach/Bowel: Normal appearance of the stomach and duodenum. No small  bowel dilatation. 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 Status post partial colectomy. Vascular/Lymphatic: Aortic atherosclerosis. No enlarged abdominal or pelvic lymph nodes. Reproductive: Prostate is unremarkable. Other: Bilateral fat containing inguinal hernias. Musculoskeletal: No acute or significant osseous findings. IMPRESSION: 1. No acute abnormality of the chest, abdomen or pelvis. 2. Status post partial colectomy.  No evidence of obstruction. Aortic Atherosclerosis (ICD10-I70.0). Electronically Signed   By: Ulyses Jarred M.D.   On: 12/01/2020 02:20    ASSESSMENT AND PLAN: Transverse colon cancer, stage IIIb (T3N1a), status post a partial transverse colectomy and end colostomy 09/10/2020 CT abdomen/pelvis 09/04/2020- possible transverse colon mass with small regional lymph nodes Colonoscopy 09/05/2020- obstructing transverse colon mass-biopsy invasive adenocarcinoma, mass could not be passed CT chest 09/09/2020-no evidence of metastatic disease, 3 x 2 mm subpleural nodule in the right upper lobe likely benign subpleural lymph node Partial transverse colectomy 09/10/2020, transverse colon tumor, no lymphovascular perineural invasion, 1/19 lymph nodes positive, negative margins, MSI stable, no loss of mismatch repair protein expression Cycle 1 CAPOX 10/08/2020, oxaliplatin dose reduced to 100 mg per metered squared secondary to renal insufficiency Cycle 2 CAPOX 10/29/2020 Cycle 3 CAPOX 11/19/2020, capecitabine dose reduced secondary to hand/foot syndrome, oxaliplatin dose reduced secondary to renal failure and poor performance status CT chest/abdomen/pelvis without contrast 12/01/2020-no acute abnormality in the chest, abdomen, or pelvis.  No evidence of obstruction. Resection of a frontal meningioma 09/01/2016 Diabetes Rectal polyp- tubular adenoma on colonoscopy 09/05/2020 Hypertension Diabetic neuropathy Epilepsy Family history of breast and prostate cancer Admission with acute renal failure  10/01/2020-secondary to lack of oral intake and lisinopril, improved with intravenous hydration and holding lisinopril Admission 12/01/2020-sepsis, diarrhea HHS, A. fib with RVR, abdominal pain 11.  Respiratory failure requiring intubation 12/06/2020 12.  Progressive renal failure-CRRT started 12/06/2020  Thomas [name] remains critically ill with multiorgan failure.  He appears to have pneumonia and possibly colitis.  He continues broad-spectrum antibiotic and pressor support.  The stool output from the colostomy appears to be diminishing.  The skin changes from capecitabine are slowly improving. I discussed the case with his wife by telephone yesterday afternoon.  She is scheduled to meet with the palliative care team today. He is undergoing curative therapy for colon cancer, but he has multiple comorbid conditions and is now critically ill with multiorgan failure.   Recommendations: 1.  Continue management of sepsis syndrome and respiratory failure per critical care medicine 2.  Moisturizers, skin care to the hands and scrotum 3.  Management of renal failure/fluids per nephrology 4.  Please call Oncology as needed, I will check on him over the next few days   LOS: 8 days   Betsy Coder, MD 12/09/20

## 2020-12-09 NOTE — Progress Notes (Addendum)
ANTICOAGULATION CONSULT NOTE  Pharmacy Consult for IV heparin Indication: atrial fibrillation  No Known Allergies  Patient Measurements: Height: 5\' 8"  (172.7 cm) Weight: 118.6 kg (261 lb 7.5 oz) IBW/kg (Calculated) : 68.4 Heparin Dosing Weight: 93.4 kg  Vital Signs: Temp: 98.8 F (37.1 C) (08/01 2334) Temp Source: Axillary (08/01 2334) BP: 128/60 (08/01 2329) Pulse Rate: 90 (08/01 2329)  Labs: Recent Labs    12/06/20 0437 12/06/20 2119 12/07/20 0301 12/07/20 0932 12/07/20 1744 12/08/20 0405 12/08/20 0730 12/08/20 1521 12/08/20 2312  HGB 8.6* 8.8*  --  7.6*  --   --  10.4*  --   --   HCT 25.2* 25.6*  --  21.5*  --   --  28.9*  --   --   PLT 245 295  --  242  --   --   --   --   --   APTT  --   --   --   --   --  67*  --   --   --   HEPARINUNFRC  --   --   --   --   --   --   --   --  >1.10*  CREATININE 3.78* 4.69*   < > 5.49* 4.91* 3.24*  --  2.45*  --   TROPONINIHS  --  25*  --   --   --   --   --   --   --    < > = values in this interval not displayed.     Estimated Creatinine Clearance: 38.1 mL/min (A) (by C-G formula based on SCr of 2.45 mg/dL (H)).   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   Dizziness    Dyspnea    pt states d/t weight   Epilepsy ([place])    takes Keppra daily   GERD (gastroesophageal reflux disease)    takes Omeprazole daily   Headache    Hyperlipidemia    takes Atorvastatin daily   Hypertension    takes Lotrel daily   Peripheral edema    takes Lasix daily   Peripheral neuropathy    takes Gabapentin daily   Pneumonia 3 yrs ago   hx of   Seizures ([facility])    Sleep apnea    Urinary frequency      Medications:  On apixaban 5 mg po BID 12/04/2020-12/05/2020 for new-onset afib.  Last dose 7/29 2144. On heparin 5000 units subq every 8 hours since 7/31.  Last dose 8/1 0608.  Assessment: 65 y/o M with a h/o colon cancer s/p colectomy on chemo admitted with SIRS complicated with atrial fibrillation with rapid ventricular response, acute hypoxic respiratory failure secondary to aspiration pneumonia and severe delirium.  Now on the ventilator with acute renal failure on CRRT and pressors. Pharmacy consulted to dose IV heparin.    Hemoglobin up to 10.4, Plt WNL  12/09/2020: Heparin level >1.1- supratherapeutic on IV heparin at 1400 units/hr (also has heparin 500 units/hr running thru CRRT) Confirmed aptt also >200 sec Verified with RN that lab drawn from A-line; IV heparin infusing  peripherally No new/worsened bleeding noted by RN; still with some oozing around mouth from intubation  Goal of Therapy:  Heparin level 0.3-0.7 units/ml Monitor platelets by anticoagulation protocol: Yes   Plan:  Hold IV heparin x1 hour then resume at 1000 units/hr Recheck 8h heparin level at 1000 Remains on CRRT heparin per nephrology Monitor daily heparin level, CBC, s/s bleeding   Netta Cedars, PharmD, BCPS 12/09/2020 12:50 AM   Addendum:  Heparin restarted, but now RN reports increased bleeding from insulin needle stick that won't stop.  RN has stopped peripheral heparin infusion.  She will draw heparin level along with daily aptt.  Elink aware.  Netta Cedars, PharmD, BCPS 12/09/2020@4 :55 AM  Addendum:  Aptt >200 & heparin level >1.1- remains elevated.  Peripheral IV Heparin remains turned off.  Bleeding has resolved per RN.   Will f/u with intensivist/renal MD re: anticoagulation plan. Netta Cedars, PharmD, BCPS 12/09/2020@6 :35 AM

## 2020-12-09 NOTE — Progress Notes (Signed)
Chaplain offered prayer over [place].  Chaplain will follow-up with Okley's family.    12/09/20 1600  Clinical Encounter Type  Visited With Patient  Visit Type Follow-up;Spiritual support  Spiritual Encounters  Spiritual Needs Prayer

## 2020-12-10 ENCOUNTER — Inpatient Hospital Stay: Payer: Medicare HMO | Admitting: Oncology

## 2020-12-10 ENCOUNTER — Inpatient Hospital Stay: Payer: Medicare HMO

## 2020-12-10 DIAGNOSIS — Z515 Encounter for palliative care: Secondary | ICD-10-CM | POA: Diagnosis not present

## 2020-12-10 DIAGNOSIS — G9341 Metabolic encephalopathy: Secondary | ICD-10-CM

## 2020-12-10 DIAGNOSIS — R6521 Severe sepsis with septic shock: Secondary | ICD-10-CM | POA: Diagnosis not present

## 2020-12-10 DIAGNOSIS — Z7189 Other specified counseling: Secondary | ICD-10-CM | POA: Diagnosis not present

## 2020-12-10 DIAGNOSIS — N170 Acute kidney failure with tubular necrosis: Secondary | ICD-10-CM | POA: Diagnosis not present

## 2020-12-10 DIAGNOSIS — C184 Malignant neoplasm of transverse colon: Secondary | ICD-10-CM | POA: Diagnosis not present

## 2020-12-10 DIAGNOSIS — A419 Sepsis, unspecified organism: Secondary | ICD-10-CM | POA: Diagnosis not present

## 2020-12-10 LAB — CBC
HCT: 26.3 % — ABNORMAL LOW (ref 39.0–52.0)
Hemoglobin: 9.3 g/dL — ABNORMAL LOW (ref 13.0–17.0)
MCH: 27.4 pg (ref 26.0–34.0)
MCHC: 35.4 g/dL (ref 30.0–36.0)
MCV: 77.4 fL — ABNORMAL LOW (ref 80.0–100.0)
Platelets: 138 10*3/uL — ABNORMAL LOW (ref 150–400)
RBC: 3.4 MIL/uL — ABNORMAL LOW (ref 4.22–5.81)
RDW: 23.4 % — ABNORMAL HIGH (ref 11.5–15.5)
WBC: 14.9 10*3/uL — ABNORMAL HIGH (ref 4.0–10.5)
nRBC: 6.1 % — ABNORMAL HIGH (ref 0.0–0.2)

## 2020-12-10 LAB — APTT: aPTT: 59 seconds — ABNORMAL HIGH (ref 24–36)

## 2020-12-10 LAB — RENAL FUNCTION PANEL
Albumin: 1.6 g/dL — ABNORMAL LOW (ref 3.5–5.0)
Albumin: 1.7 g/dL — ABNORMAL LOW (ref 3.5–5.0)
Anion gap: 12 (ref 5–15)
Anion gap: 14 (ref 5–15)
BUN: 21 mg/dL (ref 8–23)
BUN: 19 mg/dL (ref 8–23)
CO2: 20 mmol/L — ABNORMAL LOW (ref 22–32)
CO2: 20 mmol/L — ABNORMAL LOW (ref 22–32)
Calcium: 7.2 mg/dL — ABNORMAL LOW (ref 8.9–10.3)
Calcium: 7.2 mg/dL — ABNORMAL LOW (ref 8.9–10.3)
Chloride: 106 mmol/L (ref 98–111)
Chloride: 103 mmol/L (ref 98–111)
Creatinine, Ser: 1.76 mg/dL — ABNORMAL HIGH (ref 0.61–1.24)
Creatinine, Ser: 1.56 mg/dL — ABNORMAL HIGH (ref 0.61–1.24)
GFR, Estimated: 43 mL/min — ABNORMAL LOW (ref >60)
GFR, Estimated: 49 mL/min — ABNORMAL LOW (ref >60)
Glucose, Bld: 131 mg/dL — ABNORMAL HIGH (ref 70–99)
Glucose, Bld: 153 mg/dL — ABNORMAL HIGH (ref 70–99)
Phosphorus: 1.9 mg/dL — ABNORMAL LOW (ref 2.5–4.6)
Phosphorus: 3.2 mg/dL (ref 2.5–4.6)
Potassium: 4.1 mmol/L (ref 3.5–5.1)
Potassium: 3.9 mmol/L (ref 3.5–5.1)
Sodium: 138 mmol/L (ref 135–145)
Sodium: 137 mmol/L (ref 135–145)

## 2020-12-10 LAB — GLUCOSE, CAPILLARY
Glucose-Capillary: 14 mg/dL — CL (ref 70–99)
Glucose-Capillary: 82 mg/dL (ref 70–99)
Glucose-Capillary: 141 mg/dL — ABNORMAL HIGH (ref 70–99)
Glucose-Capillary: 150 mg/dL — ABNORMAL HIGH (ref 70–99)
Glucose-Capillary: 76 mg/dL (ref 70–99)
Glucose-Capillary: 158 mg/dL — ABNORMAL HIGH (ref 70–99)

## 2020-12-10 LAB — LACTIC ACID, PLASMA: Lactic Acid, Venous: 2.1 mmol/L (ref 0.5–1.9)

## 2020-12-10 LAB — MAGNESIUM: Magnesium: 2.4 mg/dL (ref 1.7–2.4)

## 2020-12-10 MED ORDER — SODIUM PHOSPHATES 45 MMOLE/15ML IV SOLN
30.0000 mmol | Freq: Once | INTRAVENOUS | Status: AC
  Administered 2020-12-10: 30 mmol via INTRAVENOUS
  Filled 2020-12-10: qty 10

## 2020-12-10 MED ORDER — STERILE WATER FOR INJECTION IJ SOLN
INTRAMUSCULAR | Status: AC
  Administered 2020-12-10: 2.1 mL
  Filled 2020-12-10: qty 10

## 2020-12-10 MED ORDER — PROSOURCE TF PO LIQD
45.0000 mL | Freq: Three times a day (TID) | ORAL | Status: DC
  Administered 2020-12-10 – 2020-12-16 (×18): 45 mL
  Filled 2020-12-10 (×18): qty 45

## 2020-12-10 NOTE — Progress Notes (Signed)
eLink Physician-Brief Progress Note Patient Name: Thomas [name] DOB: 1955-06-04 MRN: [id number]   Date of Service  12/10/2020  HPI/Events of Note  Hypothermia - Temp  = 96.9 F.  eICU Interventions  Plan: Bair Hugger PRN.     Intervention Category Major Interventions: Other:  Lysle Dingwall 12/10/2020, 12:33 AM

## 2020-12-10 NOTE — Progress Notes (Signed)
SLP Cancellation Note  Patient Details Name: DAMYON MULLANE MRN: [id number] DOB: 1956-01-31   Cancelled treatment:       Reason Eval/Treat Not Completed: Other (comment) (pt remains intubated, will continue efforts, note palliative following pt) [name], MS [facility] SLP Acute Rehab Services Office [phone] Pager [phone]   [name] 12/10/2020, 3:28 PM

## 2020-12-10 NOTE — Progress Notes (Addendum)
Palliative:  HPI: 65 yo male with PMH significant for stage 3b colon cancer, L MCA stroke, frontal brain tumor, seizures, HTN, HLD, sleep apnea, aspiration pneumonia, diabetes admitted 11/30/2020 with abd pain, anorexia, and weakness. Hospitalization complicated by septic shock of uncertain origin (potentially likely colitis vs pneumonia) leading to need for intubation, vasopressors, CRRT. His condition remains tenuous in ICU.  I met today at Thomas [name]'s bedside with wife, Thomas [name]. We discussed his hypothermia over night. Vasopressor needs are decreasing. I followed up with Thomas [name] regarding conversation yesterday and she reports this was a helpful conversation and they have better understanding of his progression and condition. She reports no further questions or concerns have arisen since conversation. I further clarified that the medical team had been working under the thought that Thomas [name] likely had an infection leading to shock but just not able to clearly identify where the infection was coming from until more recently likely colitis/pneumonia. We also discussed Epic access to assist with maintaining she is getting clear and up to date information and and I sent link to her email. I also explained that sometimes what family members read in the chart can be confusing and always speak with the medical team for any clarification or concerns.   All questions/concerns addressed. Emotional support provided.   Exam: Sedated on vent. Tolerating vent FiO2 50%. Tolerating CRRT. Coming off pressors - Levo @ 1 mcg/hr. Abd soft. Generalized edema.   Plan: - No changes in goals of care. Continue full code, full scope.   15 min  Thomas Sill, NP Palliative Medicine Team Pager [phone] (Please see [email or web address] for schedule) Team Phone [phone]    Greater than 50%  of this time was spent counseling and coordinating care related to the above assessment and plan

## 2020-12-10 NOTE — TOC Progression Note (Signed)
Transition of Care [facility]) - Progression Note    Patient Details  Name: MARKES SHATSWELL MRN: [id number] Date of Birth: 1956-04-18  Transition of Care [facility]) CM/SW Contact  Leeroy Cha, RN Phone Number: 12/10/2020, 9:23 AM  Clinical Narrative:    64 yo male with PMH significant for stage 3b colon cancer, L MCA stroke, frontal brain tumor, seizures, HTN, HLD, sleep apnea, aspiration pneumonia, diabetes admitted 11/30/2020 with abd pain, anorexia, and weakness. Hospitalization complicated by septic shock of uncertain origin (potentially likely colitis vs pneumonia) leading to need for intubation, vasopressors, CRRT. His condition remains tenuous in ICU.   Expected Discharge Plan: Home/Self Care Barriers to Discharge: Continued Medical Work up  Expected Discharge Plan and Services Expected Discharge Plan: Home/Self Care   Discharge Planning Services: CM Consult   Living arrangements for the past 2 months: Single Family Home                                       Social Determinants of Health (SDOH) Interventions    Readmission Risk Interventions No flowsheet data found.

## 2020-12-10 NOTE — Progress Notes (Signed)
Subjective:  CRRT running without incident  BP overall better-  have decreased pressor dosing-  was essentially even with CRRT last 24 hours- fio2 down to 50.  Family wants full scope of aggressive care to continue  Objective Vital signs in last 24 hours: Vitals:   12/10/20 0900 12/10/20 1000 12/10/20 1100 12/10/20 1130  BP: 116/66 129/63 119/71   Pulse: 67 68 68   Resp: (!) 26 (!) 25 (!) 25   Temp: (!) 97.3 F (36.3 C) 97.6 F (36.4 C)  97.7 F (36.5 C)  TempSrc: Axillary Axillary  Axillary  SpO2: 93% 94% 95%   Weight:      Height:       Weight change: -0.7 kg  Intake/Output Summary (Last 24 hours) at 12/10/2020 1140 Last data filed at 12/10/2020 1100 Gross per 24 hour  Intake 1980.63 ml  Output 2192.4 ml  Net -211.77 ml    Assessment/ Plan: Pt is a 65 y.o. yo male with DM, epilepsy and also colon CA- s/p chemo who was admitted on 11/30/2020 with Afib with RVR with MSOF in the setting of aspiration PNA-  complicated by AKI Assessment/Plan: 1. Renal-  baseline crt 1.5 to 1.6 A on CRF in the setting of MSOF-  CRRT initiated on 7/31- running well.  Essentially no UOP-  need to continue to support with CRRT-  K 4.5-   phos now under 2-  will replete-  no change to CRRT prescription  2. HTN/volume-  have had to run positive due to hypotension and increased pressor requirements-  not massively overloaded-  will try to pull if gets more stable-- now to running even-  will attempt a little UF at 50 per hour  3. Pulm-  VDRF-  aspiration PNA-  maxepime /vanc 4. Anemia- supportive care-  given 2 units PRBC 7/31-- hgb improved but drifting down slowly  5. Acidosis-  improved with PH over 7.4-   bicarb OK-  I am going to continue the  bicarb based dialysate.  Lactate decreasing 6. Afib-  on amio-  pretty well rate controlled 7.  Dispo-  critically ill and has baseline cancer-  family meeting but wishing for aggressive care to continue   [place]: Basic Metabolic  Panel: Recent Labs  Lab 12/09/20 0416 12/09/20 1555 12/10/20 0412  NA 136 135 138  K 4.5 4.1 4.1  CL 106 103 106  CO2 20* 19* 20*  GLUCOSE 129* 130* 131*  BUN 31* 25* 21  CREATININE 2.06* 1.90* 1.76*  CALCIUM 7.0* 6.9* 7.2*  PHOS 2.6 2.2* 1.9*   Liver Function Tests: Recent Labs  Lab 12/08/20 1521 12/09/20 0416 12/09/20 0916 12/09/20 1555 12/10/20 0412  AST 43*  --  46*  --   --   ALT 23  --  23  --   --   ALKPHOS 61  --  74  --   --   BILITOT 2.1*  --  2.0*  --   --   PROT 5.4*  --  5.4*  --   --   ALBUMIN 1.8*  1.8*   < > 1.7* 1.7* 1.6*   < > = values in this interval not displayed.   No results for input(s): LIPASE, AMYLASE in the last 168 hours. No results for input(s): AMMONIA in the last 168 hours. CBC: Recent Labs  Lab 12/05/20 0442 12/06/20 0437 12/06/20 2119 12/07/20 0932 12/08/20 0730 12/09/20 0416 12/10/20 0412  WBC 4.4 4.7 6.1  7.5  --  17.0* 14.9*  NEUTROABS 1.8 2.0 3.9  --   --   --   --   HGB 9.6* 8.6* 8.8* 7.6* 10.4* 10.1* 9.3*  HCT 27.6* 25.2* 25.6* 21.5* 28.9* 28.6* 26.3*  MCV 77.5* 78.8* 76.9* 74.7*  --  76.7* 77.4*  PLT 264 245 295 242  --  161 138*   Cardiac Enzymes: No results for input(s): CKTOTAL, CKMB, CKMBINDEX, TROPONINI in the last 168 hours. CBG: Recent Labs  Lab 12/09/20 2003 12/09/20 2340 12/10/20 0406 12/10/20 0754 12/10/20 1122  GLUCAP 141* 140* 82 141* 150*    Iron Studies: No results for input(s): IRON, TIBC, TRANSFERRIN, FERRITIN in the last 72 hours. Studies/Results: CT ABDOMEN PELVIS WO CONTRAST  Result Date: 12/08/2020 CLINICAL DATA:  Abdominal distension, decreasing colostomy output, history of colon cancer status post transverse colectomy EXAM: CT ABDOMEN AND PELVIS WITHOUT CONTRAST TECHNIQUE: Multidetector CT imaging of the abdomen and pelvis was performed following the standard protocol without IV contrast. Oral enteric contrast was administered COMPARISON:  CT chest abdomen pelvis, 12/01/2020 FINDINGS:  Lower chest: There is new, dense heterogeneous and consolidative airspace opacity of the right lower and right middle lobes (series 7, image 1). Coronary artery calcifications. Aortic valve calcifications Hepatobiliary: No solid liver abnormality is seen. Hepatic steatosis. No gallstones, gallbladder wall thickening, or biliary dilatation. Pancreas: Unremarkable. No pancreatic ductal dilatation or surrounding inflammatory changes. Spleen: Normal in size without significant abnormality. Adrenals/Urinary Tract: Adrenal glands are unremarkable. Kidneys are normal, without renal calculi, solid lesion, or hydronephrosis. Bladder is unremarkable. Stomach/Bowel: Esophagogastric tube is position with tip and side port below the diaphragm, however folded in the gastric body, with tip in the gastric fundus (series 2, image 17). 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 The proximal colon as well as the excluded colonic remnant are fluid-filled, and there is inflammatory wall thickening and fat stranding about the descending and sigmoid colon (series 2, image 78). Vascular/Lymphatic: Left femoral venous and arterial catheters. Aortic atherosclerosis. Incidental note of retroaortic left renal vein. No enlarged abdominal or pelvic lymph nodes. Reproductive: No mass or other significant abnormality. Other: Mild anasarca. Fat containing left inguinal hernia. No abdominopelvic ascites. Musculoskeletal: No acute or significant osseous findings. IMPRESSION: 1. 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 2. The proximal colon as well as the excluded colonic remnant are fluid-filled, and there is inflammatory wall thickening and fat stranding about the descending and sigmoid colon. Findings are new compared to prior CT dated 12/01/2020, consistent with nonspecific infectious, inflammatory, or ischemic colitis. 3. There is new, dense heterogeneous and consolidative airspace opacity  of the right lower and right middle lobes, consistent with infection or aspiration. 4. Esophagogastric tube is positioned with tip and side port below the diaphragm, however folded in the gastric body, with tip in the gastric fundus. Recommend repositioning. 5. Hepatic steatosis. 6. Coronary artery disease. Aortic Atherosclerosis (ICD10-I70.0). Electronically Signed   By: Eddie Candle M.D.   On: 12/08/2020 17:45   DG CHEST PORT 1 VIEW  Result Date: 12/09/2020 CLINICAL DATA:  65 year old male, evaluate for pneumonia. EXAM: PORTABLE CHEST - 1 VIEW COMPARISON:  12/07/2020 FINDINGS: The patient is rotated to the right, limiting of diagnostic evaluation. Unchanged appearance of right chest wall, internal jugular vein approach Port-A-Cath with the catheter tip in the superior right atrium. Endotracheal tube in place with the tip in the inferior thoracic trachea, just above the carina. Gastric decompression tube courses off the inferior aspect of  this image, the tip is visualized in the fundus. The mediastinal contours are obscured, though similar to comparison limits. No cardiomegaly. Hazy opacities in the right lung base, limited evaluation due to projection. No acute osseous abnormality. IMPRESSION: 1. Right lower lobe subsegmental atelectasis versus infiltrate. Limited evaluation due to patient rotation. Recommend attention on follow-up. 2. Support lines and tubes in unchanged, appropriate positions. Electronically Signed   By: Ruthann Cancer MD   On: 12/09/2020 11:15   Medications: Infusions:   prismasol BGK 4/2.5 400 mL/hr at 12/10/20 0617    prismasol BGK 4/2.5 200 mL/hr at 12/09/20 1958   sodium chloride 10 mL/hr at 12/10/20 1100   sodium chloride Stopped (12/10/20 1014)   amiodarone 30 mg/hr (12/10/20 1100)   ceFEPime (MAXIPIME) IV Stopped (12/10/20 0043)   famotidine (PEPCID) IV Stopped (12/10/20 0037)   fentaNYL infusion INTRAVENOUS 120 mcg/hr (12/10/20 1100)   heparin 10,000 units/ 20 mL infusion  syringe 500 Units/hr (12/10/20 0005)   levETIRAcetam Stopped (12/10/20 1009)   methocarbamol (ROBAXIN) IV Stopped (12/10/20 5643)   metronidazole 100 mL/hr at 12/10/20 1100   midazolam 1 mg/hr (12/10/20 1100)   norepinephrine (LEVOPHED) Adult infusion 1 mcg/min (12/10/20 1100)   prismasol BGK 4/2.5 1,800 mL/hr at 12/10/20 0148   promethazine (PHENERGAN) injection (IM or IVPB)     sodium phosphate  Dextrose 5% IVPB 43 mL/hr at 12/10/20 1100   thiamine injection 250 mg (12/10/20 1125)   vasopressin Stopped (12/08/20 1034)    Scheduled Medications:  albuterol  2.5 mg Nebulization QID   chlorhexidine gluconate (MEDLINE KIT)  15 mL Mouth Rinse BID   Chlorhexidine Gluconate Cloth  6 each Topical Daily   feeding supplement (VITAL HIGH PROTEIN)  1,000 mL Per Tube Q24H   hydrocerin   Topical BID   hydrocortisone sod succinate (SOLU-CORTEF) inj  100 mg Intravenous Q8H   insulin aspart  1-3 Units Subcutaneous Q4H   mouth rinse  15 mL Mouth Rinse 10 times per day   OLANZapine  5 mg Intramuscular QHS    have reviewed scheduled and prn medications.  Physical Exam: General: sedated, intubated Heart: borderline tachy-  irreg  Lungs: CBS bilat Abdomen: obese-  ostomy in RLQ-  liquid stool Extremities:  dep edema Dialysis Access: left femoral HD cath  - 59 days old    12/10/2020,11:40 AM  LOS: 9 days

## 2020-12-10 NOTE — Progress Notes (Signed)
Updates provided to pts wife, all questions answered   Eliseo Gum MSN, AGACNP-BC [place] for pager 12/10/2020, 4:09 PM

## 2020-12-10 NOTE — Progress Notes (Signed)
RT bagged and lavaged patient at this time.  SPO2 increased to 91% after suctioning. Secretions are thick and tan. RT will continue to monitor.

## 2020-12-10 NOTE — Progress Notes (Signed)
Nutrition Follow-up  DOCUMENTATION CODES:   Non-severe (moderate) malnutrition in context of chronic illness, Obesity unspecified  INTERVENTION:  - continue trickle rate TF today. - goal TF regimen: Vital High Protein @ 60 ml/hr with 45 ml Prosource TF TID to provide 1560 kcal, 159 grams protein, and 1204 ml free water.  - if able to tolerate trickle rate, recommend increase by 10 ml/hr every 12 hours to reach goal rate.  - free water flush per CCM.    NUTRITION DIAGNOSIS:   Moderate Malnutrition related to chronic illness, cancer and cancer related treatments as evidenced by mild fat depletion, mild muscle depletion, percent weight loss. -ongoing  GOAL:   Patient will meet greater than or equal to 90% of their needs -unable to meet at this time  MONITOR:   Vent status, TF tolerance, Labs, Weight trends, Skin, I & O's  ASSESSMENT:   65 y.o. male who is a former with medical history of stage 3 colon cancer s/p partial colectomy and end colostomy on 5/4, recent chemo start with 3rd cycle on 7/22, sleep apnea, seizures, epilepsy, brain tumor, HTN, HLD, GERD, asthma, peripheral neuropathy, urinary frequency, DM, MCA stroke. He presented to the ED via EMS due to abdominal pain x2 weeks, anorexia, and weakness. Admitted for close monitoring for concern of mesenteric ischemia. Patient reported that abdominal pain was worse with eating PTA.  Significant Events: 7/25- admission 7/26- initial RD assessment 7/27- diet advanced to CLD 7/28- diet advanced to Dysphagia 1, thin liquids 7/29- diet downgraded to NPO 7/30- intubation; OGT placement (gastric 7/31- non-tunneled triple lumen HD cath placed in L femoral; CRRT initiation 8/2- repeat CT abdomen/pelvis showed wall thickening and fat stranding of descending and sigmoid colon, non-specific infectious, inflammatory, or ischemic colitis; new consolidative airspace opacity in R lower and R middle lobes   Patient remains intubated with OGT  in place. Palliative Care and CCM able to meet with family yesterday. Patient to remain Full Code at this time.   Able to talk with CCM NP via secure chat this AM and plan for TF initiation with slow advancement. Able to talk with RN at bedside. Attempting to wean sedation.  He is currently receiving Vital High Protein @ 10 ml/hr with 30 ml free water every 4 hours. This regimen provides 240 kcal, 21 grams protein, and 381 ml free water.   Patient remains on CRRT. He has had minimal colostomy output this shift. He is noted to be +6.5 L since admission. Mild pitting edema to BUE and perineal area, moderate pitting edema to BLE documented in the edema section of flow sheet. Weight is +3 lb since 7/30.   Per notes: - acute metabolic encephalopathy - aspiration PNA/recurrent aspiration - septic shock d/t PNA and colitis - AKI on stage 3 CKD--CRRT to continue - stage 3 transverse colon cancer s/p partial transverse colectomy and end colostomy PTA - CAPOX 6/1, 6/22, 7/13 with curavite goal   Patient is currently intubated on ventilator support MV: 16.6 L/min Temp (24hrs), Avg:97.7 F (36.5 C), Min:96.2 F (35.7 C), Max:99.2 F (37.3 C) Propofol: none BP: 110/57 and MAP: 73 (from ART line reading)  Labs reviewed; CBGs: 82 and 141 mg/dl, creatinine: 1.76 mg/dl, Ca: 7.2 mg/dl, Phos: 1.9 mg/dl, GFR: 43 ml/min. Medications reviewed; 20 mg IV pepcid/day, 100 mg solu-cortef TID, sliding scale novolog, 30 mmol IV NaPhos x1 run 8/3, 250 mg IV thiamine BID. Drips; amio @ 30 mg/hr, fentanyl @ 120 mcg/hr, versed @ 1 mg/hr, levo @  1 mcg/hr.   Diet Order:   Diet Order             Diet NPO time specified  Diet effective now                   EDUCATION NEEDS:   Not appropriate for education at this time  Skin:  Skin Assessment: Skin Integrity Issues: Skin Integrity Issues:: Stage II Stage II: mid-coccyx  Last BM:  8/3 (155 ml via colostomy)  Height:   Ht Readings from Last 1  Encounters:  12/01/20 5\' 8"  (1.727 m)    Weight:   Wt Readings from Last 1 Encounters:  12/10/20 118.1 kg     Estimated Nutritional Needs:  Kcal:  1540-1750 kcal Protein:  150-165 grams Fluid:  >/= 2.2 L/day     Thomas Matin, MS, RD, LDN, CNSC Inpatient Clinical Dietitian RD pager # available in [place]  After hours/weekend pager # available in [facility]

## 2020-12-10 NOTE — Progress Notes (Addendum)
NAME:  Thomas [name], MRN:  [id number], DOB:  1955/10/12, LOS: 9 ADMISSION DATE:  11/30/2020, CONSULTATION DATE:  7/30 REFERRING MD:  Riccardo Dubin, CHIEF COMPLAINT:  Worsening hypoxia/dyspnea  History of Present Illness:  65 yo M admitted for worsening sepsis vs reaction to chemotherapy with high ostomy output and soft BP on 7/25. Has had issues during his hospitalization with recurrent aspirations and more recently worsening respiratory status.  Most recently pt developed worsening hypoxia and was requiring BIPAP w/ FIO2 of 100% and increased work of breathing. Unable to obtain ABG, CXR in AM of 7/30 showed some worsening concern of aspiration.   Who was admitted to intensive care and subsequently had worsening clinical status requiring intubation, CRRT, multiple pressors and pneumonia along with CT findings 8/1 concerning for ischemic bowel.  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  Hx of remote MCA stroke Asthma hx as a child Epilepsy on keppra GERD T2DM on metformin as outpt HTN HLD OSA  Significant Hospital Events:  7/27 Transferred to [facility], stopped abx as sepsis resolved/ruled out 7/30 AM Worsened AME, more hypoxic with worsening renal funciton, new aspiration on abx restarted 7/30 PM Pt with increased o2 requirement on 100% FIO2 on BIPAP, d/w family who wanted to proceed with intubation, pt intubated 8/1 Some issues with tachycardia and hypotension overnight. Reproted high ostomy output  8/2 down to 9 mcg of Levophed, however repeat CT abdomen/pelvis showed inflammatory wall thickening and fat stranding of the descending and sigmoid colon, new since prior, consistent with nonspecific infectious, inflammatory, or ischemic colitis.  Also new dense consolidative airspace opacity in the right lower and right middle lobes 8/3  further weaning NE. Weaning sedation. Full code, full scope per family meeting 8/2   Micro: Resp viral panel 7/24 > neg covid/ flu BC  7/24  x 2 neg  MRSA  7/25 neg  UC   7/25  neg  C diff screeen 7/26  neg ET culture 7/31 > moderate yeast> GI panel  8/1> neg   Abx: Unasyn 7/30 >>7/31 Maxepime 7/31 >> Flagyl 7/31 >> Vancomycin 8/1>> Anidulafungin 8/1>>8/3  Interim History / Subjective:   Decr pressor requirement this morning Hypothermic overnight, now with bair hugger   Objective   Blood pressure 129/63, pulse 68, temperature 97.6 F (36.4 C), temperature source Axillary, resp. rate (!) 25, height 5\' 8"  (1.727 m), weight 118.1 kg, SpO2 94 %.    Vent Mode: PRVC FiO2 (%):  [50 %] 50 % Set Rate:  [26 bmp] 26 bmp Vt Set:  [600 mL] 600 mL PEEP:  [5 cmH20] 5 cmH20 Plateau Pressure:  [18 cmH20-22 cmH20] 22 cmH20   Intake/Output Summary (Last 24 hours) at 12/10/2020 1131 Last data filed at 12/10/2020 1100 Gross per 24 hour  Intake 1980.63 ml  Output 2192.4 ml  Net -211.77 ml   Filed Weights   12/08/20 0447 12/09/20 0414 12/10/20 0416  Weight: 118.6 kg 118.8 kg 118.1 kg   General: Critically ill appearing adult M, intubated sedated NAD  HEENT: NCAT pink mmm anicteric sclera Ett secure  Neuro: Sedated. Withdraws to pain. Does not follow commands. PERRL  CV: RRR distant heart sounds. 1+ peripheral pulses PULM: Symmetrical chest expansion, some scattered rhonchi. Diminished bibasilar sounds. Mechanically ventilated  GI:  R sided ostomy. Liquid output. Soft abdomen  Extremities: no acute joint deformity, no cyanosis or  clubbing. Some dependent edema. L fem trialysis  Skin: palmar surface of hands with dry cracking sloughing skin, dry beefy red blanching discoloration underneath sloughed area   Resolved Hospital Problem list     Assessment & Plan:   Acute metabolic encephalopathy  Hx seizure disorder Hx prior CVA -left ischemic stroke 2015 P: -Cont keppra, sz  precautions -correct metabolic abnormalities as able  Acute hypoxic respiratory failure requiring mechanical ventilation Aspiration PNA, Candida tropicalis  - candida felt unlikely to be contributing to septic picture  P: -VAP, pulm hygiene -wean vent settings as able  -dc eraxis  Septic shock due to PNA and Colitis  -recurrent aspiration, candida PNA, CT a/p with inflammatory changes c/w colitis  P: -cont broad abx, with coverage for colitis (on cefepime, flagyl)  -appreciate CCS following -pressors for MAP >65 -think we can trial initiation of EN, trickle to start and see how he tolerates. If not tolerable, can consider TPN but think trial of gut utilization is ok (doesn't sound like massive concern for ischemic gut as colitis etiology, EN unless not tolerated typically preferrable in most colitis states, pressors are significantly down though data also does support EN with pretty high pressors)  -trend ostomy output closely  -serial abdominal exam   AKI on CKD 3a Hyperkalemia, improved Hypophosphatemia  Continued extremely minimal urine output Likely ATN in the setting of shock P: -CRRT per nephro  -renal fxn panel  - dc foley, has made minimal urine for a few days.  -Bladder scan, I/O PRN   NAGMA Lactic acidosis P -will stop trending LA for now -- looks to have peaked 8/2 -septic shock, AKI on CKD as above   Transverse colon cancer stg IIIb ([id number]) S/p partial transverse colectomy, end colectomy On CAPOX (6/1, 6/22, 7/13) -goal curative P: -Follows with   Afib RVR - new onset -- improved, back in NSR HFpEF -Echocardiogram diastolic dysfunction and EF 60 to 65% P: -Controlled on amiodarone -Replete mag as needed -dont think there is a great role for systemic AC at present (eliquis started this admit, changed to hep gtt when pt couldn't take POs, then PTT very elevated so held. Is NSR now so will defer AC)  Hx HTN Hx HLD -Home medication includes: Norvasc,  Lipitor,  P: -holding home meds  DM2 with hyperglycemia -Home medications include Metformin  P: -Continue SSI and every 4 hours CBG  Class II obesity Moderate kcal/protein malnutrition  P: -May need TPN  Stg II pressure injury of coccyx, poa P: Pressure alleviating devices  Wound care   Capecitabine induced desquamation  -superficial dry sloughing, areas of cracking, with underlying dry beefy erythematous discoloration  P: -moisturizer to hands feet scrotum   Goals of Care P: -interdisciplinary GOC mtg 8/2 -- family wishes for full code full scope of offered intervention   Best Practice  Diet/type: NPO DVT prophylaxis: SCD GI prophylaxis: H2B Lines: [place] Foley:  Yes, and it is still needed Code Status:  full code Last date of goals of care discussion : 8/2 : PCCM Palli, RN, family -- full code full scope    CRITICAL CARE Performed by: Cristal Generous   Total critical care time: 54 minutes  Critical care time was exclusive of separately billable procedures and treating other patients. Critical care was necessary to treat or prevent imminent or life-threatening deterioration.  Critical care was time spent personally by me on the following activities: development of treatment plan with patient and/or surrogate as well as nursing,  discussions with consultants, evaluation of patient's response to treatment, examination of patient, obtaining history from patient or surrogate, ordering and performing treatments and interventions, ordering and review of laboratory studies, ordering and review of radiographic studies, pulse oximetry and re-evaluation of patient's condition.  Eliseo Gum MSN, AGACNP-BC [place] for pager  12/10/2020, 11:31 AM  Xxxxxx    ATTESTATION & SIGNATURE   STAFF NOTE: I, Dr Ann Lions have personally reviewed patient's available data, including medical history, events of note, physical examination and test  results as part of my evaluation. I have discussed with resident/NP and other care providers such as pharmacist, RN and RRT.  In addition,  I personally evaluated patient and elicited key findings of   S: Date of admit 11/30/2020 with LOS 9 for today 12/10/2020 : DEVERON SHAMOON is  -pressors continue.  On ventilator with FiO2 50%.  On fentanyl and Versed.  On amnio drip.  On CRRT but not making any urine.  Goals of care listed and patient is full code.  Ostomy output around 200 cc in the last 6 hours according to nursing.  She is on cefepime and Flagyl  Cultures are still negative.  Except Candida tropicalis on respiratory culture.  Stool PCR is negative.  CT scan showed colitis  O:  Blood pressure 119/71, pulse 68, temperature 97.7 F (36.5 C), temperature source Axillary, resp. rate (!) 25, height 5\' 8"  (1.727 m), weight 118.1 kg, SpO2 95 %.   Deeply sedated RASS sedation score -4 synchronous with the ventilator.  We have begun CRRT on.    A: Acute respiratory failure Septic shock Acute renal failure on CRRT,  sedation needs on the ventilator Colitis causing septic shock.  Etiology of colitis not determined.  P: Full ICU supportive care as expressed above.   Rest per NP/medical resident whose note is outlined above and that I agree with  The patient is critically ill with multiple organ systems failure and requires high complexity decision making for assessment and support, frequent evaluation and titration of therapies, application of advanced monitoring technologies and extensive interpretation of multiple databases.   Critical Care Time devoted to patient care services described in this note is  15  Minutes. This time reflects time of care of this signee Dr Brand Males. This critical care time does not reflect procedure time, or teaching time or supervisory time of PA/NP/Med student/Med Resident etc but could involve care discussion time     Dr. Brand Males, M.D.,  [facility]C.P Pulmonary and Critical Care Medicine Staff Physician [place] [facility] Pager: [phone], If no answer or between  15:00h - 7:00h: call [phone]  12/10/2020 12:22 PM

## 2020-12-11 ENCOUNTER — Other Ambulatory Visit (HOSPITAL_COMMUNITY): Payer: Self-pay

## 2020-12-11 DIAGNOSIS — E1100 Type 2 diabetes mellitus with hyperosmolarity without nonketotic hyperglycemic-hyperosmolar coma (NKHHC): Secondary | ICD-10-CM | POA: Diagnosis not present

## 2020-12-11 DIAGNOSIS — G9341 Metabolic encephalopathy: Secondary | ICD-10-CM | POA: Diagnosis not present

## 2020-12-11 DIAGNOSIS — N170 Acute kidney failure with tubular necrosis: Secondary | ICD-10-CM | POA: Diagnosis not present

## 2020-12-11 DIAGNOSIS — J9601 Acute respiratory failure with hypoxia: Secondary | ICD-10-CM | POA: Diagnosis not present

## 2020-12-11 DIAGNOSIS — A419 Sepsis, unspecified organism: Secondary | ICD-10-CM | POA: Diagnosis not present

## 2020-12-11 LAB — CBC
HCT: 26.4 % — ABNORMAL LOW (ref 39.0–52.0)
Hemoglobin: 9.1 g/dL — ABNORMAL LOW (ref 13.0–17.0)
MCH: 27.2 pg (ref 26.0–34.0)
MCHC: 34.5 g/dL (ref 30.0–36.0)
MCV: 79 fL — ABNORMAL LOW (ref 80.0–100.0)
Platelets: 103 10*3/uL — ABNORMAL LOW (ref 150–400)
RBC: 3.34 MIL/uL — ABNORMAL LOW (ref 4.22–5.81)
RDW: 24.1 % — ABNORMAL HIGH (ref 11.5–15.5)
WBC: 14.4 10*3/uL — ABNORMAL HIGH (ref 4.0–10.5)
nRBC: 5.2 % — ABNORMAL HIGH (ref 0.0–0.2)

## 2020-12-11 LAB — MAGNESIUM: Magnesium: 2.5 mg/dL — ABNORMAL HIGH (ref 1.7–2.4)

## 2020-12-11 LAB — RENAL FUNCTION PANEL
Albumin: 1.7 g/dL — ABNORMAL LOW (ref 3.5–5.0)
Albumin: 1.8 g/dL — ABNORMAL LOW (ref 3.5–5.0)
Anion gap: 12 (ref 5–15)
Anion gap: 9 (ref 5–15)
BUN: 20 mg/dL (ref 8–23)
BUN: 20 mg/dL (ref 8–23)
CO2: 21 mmol/L — ABNORMAL LOW (ref 22–32)
CO2: 21 mmol/L — ABNORMAL LOW (ref 22–32)
Calcium: 7.3 mg/dL — ABNORMAL LOW (ref 8.9–10.3)
Calcium: 7 mg/dL — ABNORMAL LOW (ref 8.9–10.3)
Chloride: 105 mmol/L (ref 98–111)
Chloride: 107 mmol/L (ref 98–111)
Creatinine, Ser: 1.58 mg/dL — ABNORMAL HIGH (ref 0.61–1.24)
Creatinine, Ser: 1.54 mg/dL — ABNORMAL HIGH (ref 0.61–1.24)
GFR, Estimated: 49 mL/min — ABNORMAL LOW (ref >60)
GFR, Estimated: 50 mL/min — ABNORMAL LOW (ref >60)
Glucose, Bld: 174 mg/dL — ABNORMAL HIGH (ref 70–99)
Glucose, Bld: 200 mg/dL — ABNORMAL HIGH (ref 70–99)
Phosphorus: 2 mg/dL — ABNORMAL LOW (ref 2.5–4.6)
Phosphorus: 3.2 mg/dL (ref 2.5–4.6)
Potassium: 3.8 mmol/L (ref 3.5–5.1)
Potassium: 4.1 mmol/L (ref 3.5–5.1)
Sodium: 138 mmol/L (ref 135–145)
Sodium: 137 mmol/L (ref 135–145)

## 2020-12-11 LAB — GLUCOSE, CAPILLARY
Glucose-Capillary: 138 mg/dL — ABNORMAL HIGH (ref 70–99)
Glucose-Capillary: 166 mg/dL — ABNORMAL HIGH (ref 70–99)
Glucose-Capillary: 168 mg/dL — ABNORMAL HIGH (ref 70–99)
Glucose-Capillary: 176 mg/dL — ABNORMAL HIGH (ref 70–99)
Glucose-Capillary: 203 mg/dL — ABNORMAL HIGH (ref 70–99)
Glucose-Capillary: 203 mg/dL — ABNORMAL HIGH (ref 70–99)

## 2020-12-11 LAB — APTT: aPTT: 45 seconds — ABNORMAL HIGH (ref 24–36)

## 2020-12-11 MED ORDER — POTASSIUM CHLORIDE 10 MEQ/100ML IV SOLN
10.0000 meq | INTRAVENOUS | Status: AC
Start: 2020-12-11 — End: 2020-12-11
  Administered 2020-12-11 (×4): 10 meq via INTRAVENOUS
  Filled 2020-12-11 (×4): qty 100

## 2020-12-11 MED ORDER — CLONAZEPAM 0.5 MG PO TBDP: 0.5000 mg | ORAL_TABLET | Freq: Two times a day (BID) | ORAL | Status: DC

## 2020-12-11 MED ORDER — OLANZAPINE 5 MG PO TABS
5.0000 mg | ORAL_TABLET | Freq: Every day | ORAL | Status: DC
  Administered 2020-12-11 – 2020-12-17 (×7): 5 mg
  Filled 2020-12-11 (×9): qty 1

## 2020-12-11 MED ORDER — METHOCARBAMOL 500 MG PO TABS
500.0000 mg | ORAL_TABLET | Freq: Four times a day (QID) | ORAL | Status: DC | PRN
Start: 2020-12-11 — End: 2020-12-21

## 2020-12-11 MED ORDER — METHOCARBAMOL 1000 MG/10ML IJ SOLN: 500.0000 mg | Freq: Four times a day (QID) | INTRAVENOUS | Status: DC | PRN

## 2020-12-11 MED ORDER — CLONAZEPAM 0.5 MG PO TBDP
0.5000 mg | ORAL_TABLET | Freq: Two times a day (BID) | ORAL | Status: DC
  Administered 2020-12-11 – 2020-12-13 (×6): 0.5 mg
  Filled 2020-12-11 (×6): qty 1

## 2020-12-11 MED ORDER — VITAL HIGH PROTEIN PO LIQD
1000.0000 mL | ORAL | Status: AC
  Administered 2020-12-11 – 2020-12-16 (×9): 1000 mL

## 2020-12-11 MED ORDER — CLONAZEPAM 0.1 MG/ML ORAL SUSPENSION
0.5000 mg | Freq: Two times a day (BID) | ORAL | Status: DC
  Filled 2020-12-11: qty 5

## 2020-12-11 MED ORDER — SODIUM PHOSPHATES 45 MMOLE/15ML IV SOLN
30.0000 mmol | Freq: Once | INTRAVENOUS | Status: AC
  Administered 2020-12-11: 30 mmol via INTRAVENOUS
  Filled 2020-12-11: qty 10

## 2020-12-11 NOTE — Progress Notes (Signed)
Progress Note     Subjective: Patient remains intubated.  On trickle TFs starting yesterday.  Seems to be tolerating per RN. Objective: Vital signs in last 24 hours: Temp:  [97.6 F (36.4 C)-98.9 F (37.2 C)] 98.5 F (36.9 C) (08/04 0731) Pulse Rate:  [68-82] 72 (08/04 0700) Resp:  [20-30] 22 (08/04 0700) BP: (107-157)/(56-98) 111/56 (08/04 0600) SpO2:  [89 %-100 %] 100 % (08/04 0722) Arterial Line BP: (96-137)/(48-67) 126/58 (08/04 0700) FiO2 (%):  [50 %-75 %] 50 % (08/04 0722) Weight:  [116.5 kg] 116.5 kg (08/04 0500) Last BM Date: 12/10/20  Intake/Output from previous day: 08/03 0701 - 08/04 0700 In: 2295.4 [I.V.:1120.1; NG/GT:207.3; IV Piggyback:968] Out: 3335.8 [Stool:753] Intake/Output this shift: Total I/O In: 318.9 [I.V.:93.2; Other:140; NG/GT:20; IV Piggyback:65.7] Out: 130 [Other:120; Stool:10]  PE: General: WD, obese male, who is intubated and sedated  Lungs: vent  Abd: soft, ND, midline surgical scar well healed, stoma pink and with liquid stool output, OGT in place with TFs running   Lab Results:  Recent Labs    12/10/20 0412 12/11/20 0500  WBC 14.9* 14.4*  HGB 9.3* 9.1*  HCT 26.3* 26.4*  PLT 138* 103*   BMET Recent Labs    12/10/20 1714 12/11/20 0500  NA 137 138  K 3.9 3.8  CL 103 105  CO2 20* 21*  GLUCOSE 153* 174*  BUN 19 20  CREATININE 1.56* 1.58*  CALCIUM 7.2* 7.3*   PT/INR Recent Labs    12/09/20 0916  LABPROT 20.4*  INR 1.7*   CMP     Component Value Date/Time   NA 138 12/11/2020 0500   K 3.8 12/11/2020 0500   CL 105 12/11/2020 0500   CO2 21 (L) 12/11/2020 0500   GLUCOSE 174 (H) 12/11/2020 0500   BUN 20 12/11/2020 0500   CREATININE 1.58 (H) 12/11/2020 0500   CREATININE 1.83 (H) 11/19/2020 0914   CALCIUM 7.3 (L) 12/11/2020 0500   PROT 5.4 (L) 12/09/2020 0916   ALBUMIN 1.7 (L) 12/11/2020 0500   AST 46 (H) 12/09/2020 0916   AST 12 (L) 11/19/2020 0914   ALT 23 12/09/2020 0916   ALT 19 11/19/2020 0914   ALKPHOS 74  12/09/2020 0916   BILITOT 2.0 (H) 12/09/2020 0916   BILITOT 0.7 11/19/2020 0914   GFRNONAA 49 (L) 12/11/2020 0500   GFRNONAA 41 (L) 11/19/2020 0914   GFRAA >60 04/01/2017 1921   Lipase     Component Value Date/Time   LIPASE 24 12/01/2020 0018       Studies/Results: No results found.  Anti-infectives: Anti-infectives (From admission, onward)    Start     Dose/Rate Route Frequency Ordered Stop   12/09/20 1800  vancomycin (VANCOREADY) IVPB 1750 mg/350 mL  Status:  Discontinued        1,750 mg 175 mL/hr over 120 Minutes Intravenous Every 24 hours 12/08/20 1357 12/09/20 1403   12/09/20 1700  anidulafungin (ERAXIS) 100 mg in sodium chloride 0.9 % 100 mL IVPB  Status:  Discontinued        100 mg 78 mL/hr over 100 Minutes Intravenous Every 24 hours 12/08/20 1359 12/09/20 1321   12/08/20 1500  vancomycin (VANCOCIN) 2,500 mg in sodium chloride 0.9 % 500 mL IVPB        2,500 mg 250 mL/hr over 120 Minutes Intravenous  Once 12/08/20 1357 12/08/20 1719   12/08/20 1500  anidulafungin (ERAXIS) 200 mg in sodium chloride 0.9 % 200 mL IVPB        200  mg 78 mL/hr over 200 Minutes Intravenous  Once 12/08/20 1359 12/08/20 1821   12/08/20 0000  ceFEPIme (MAXIPIME) 2 g in sodium chloride 0.9 % 100 mL IVPB        2 g 200 mL/hr over 30 Minutes Intravenous Every 12 hours 12/07/20 1200     12/07/20 1000  metroNIDAZOLE (FLAGYL) IVPB 500 mg        500 mg 100 mL/hr over 60 Minutes Intravenous Every 12 hours 12/07/20 0849     12/07/20 0900  ceFEPIme (MAXIPIME) 2 g in sodium chloride 0.9 % 100 mL IVPB  Status:  Discontinued        2 g 200 mL/hr over 30 Minutes Intravenous Every 24 hours 12/07/20 0809 12/07/20 1200   12/07/20 0830  metroNIDAZOLE (FLAGYL) IVPB 500 mg  Status:  Discontinued        500 mg 100 mL/hr over 60 Minutes Intravenous Every 8 hours 12/07/20 0758 12/07/20 0849   12/06/20 0900  Ampicillin-Sulbactam (UNASYN) 3 g in sodium chloride 0.9 % 100 mL IVPB  Status:  Discontinued        3  g 200 mL/hr over 30 Minutes Intravenous Every 12 hours 12/06/20 0832 12/07/20 0757   12/03/20 0800  ceFEPIme (MAXIPIME) 2 g in sodium chloride 0.9 % 100 mL IVPB  Status:  Discontinued        2 g 200 mL/hr over 30 Minutes Intravenous Every 8 hours 12/03/20 0746 12/04/20 1004   12/01/20 1000  ceFEPIme (MAXIPIME) 2 g in sodium chloride 0.9 % 100 mL IVPB  Status:  Discontinued        2 g 200 mL/hr over 30 Minutes Intravenous Every 12 hours 12/01/20 0513 12/03/20 0746   12/01/20 0300  vancomycin (VANCOREADY) IVPB 2000 mg/400 mL        2,000 mg 200 mL/hr over 120 Minutes Intravenous  Once 12/01/20 0255 12/01/20 0528   11/30/20 2245  ceFEPIme (MAXIPIME) 2 g in sodium chloride 0.9 % 100 mL IVPB        2 g 200 mL/hr over 30 Minutes Intravenous STAT 11/30/20 2237 12/01/20 0003        Assessment/Plan Stage IIIb colon cancer S/p open partial colectomy with transverse colostomy 09/10/20 Dr. Bobbye Morton Colitis - POD92 - CT AP 8/1 with nonspecific colitis in both proximal colon and excluded colon - ischemic vs infectious vs inflammatory - WBC stable at 14 - ostomy output down to 760cc - seems to be tolerating TFs with no issues currently at 10cc/hr - no acute surgical indications    FEN: NPO, OGT for TFs VTE: SQH, SCDs ID: cefepime/flagyl 7/31>>, vanc/eraxis 8/1>>   - below per primary team - Sepsis vs reaction to chemotherapy with shock - pressor requirement decreased Acute hypoxic respiratory failure/fungal PNA - vent per PCCM, resp cxs with yeast AKI on CKD stage II - on CRRT A fib with RVR - on amio gtt Hx of MCA CVA Hx of asthma Hx of epilepsy on Keppra T2DM GERD HTN HLD OSA Obesity class II - BMI 39.76  LOS: 10 days    Thomas [name], [facility] 12/11/2020, 9:30 AM Please see Amion for pager number during day hours 7:00am-4:30pm

## 2020-12-11 NOTE — Progress Notes (Signed)
Pharmacy Antibiotic Note  Thomas [name] is a 65 y.o. male admitted on 11/30/2020 with sepsis.  Pharmacy has been consulted for antibiotic dosing - currently on cefepime/flagyl for colitis/pneumonia.  Day #5 Cefepime/Flagyl - Tmax 98.9 - WBC 14.4 (patient is on stress dose steroids) - Lactate 2.1, PCT 25  Plan: Continue Cefepime 2g IV q12h Continue Flagyl 500mg  IV q12h Monitor renal function closely - currently on CRRT F/u LOT  Height: 5\' 8"  (172.7 cm) Weight: 116.5 kg (256 lb 13.4 oz) IBW/kg (Calculated) : 68.4  Temp (24hrs), Avg:97.9 F (36.6 C), Min:96.2 F (35.7 C), Max:98.9 F (37.2 C)  Recent Labs  Lab 12/06/20 2119 12/07/20 0021 12/07/20 0301 12/07/20 0932 12/07/20 1744 12/08/20 0812 12/08/20 1147 12/08/20 1521 12/09/20 0416 12/09/20 0916 12/09/20 1555 12/10/20 0412 12/10/20 1714 12/11/20 0500  WBC 6.1  --   --  7.5  --   --   --   --  17.0*  --   --  14.9*  --  14.4*  CREATININE 4.69*  --    < > 5.49*   < >  --   --    < > 2.06*  --  1.90* 1.76* 1.56* 1.58*  LATICACIDVEN 3.7* 3.6*  --   --   --  4.7* 5.3*  --   --  2.6*  --  2.1*  --   --    < > = values in this interval not displayed.     Estimated Creatinine Clearance: 58.5 mL/min (A) (by C-G formula based on SCr of 1.58 mg/dL (H)).    No Known Allergies  Antimicrobials this admission: 7/24 Cefepime >> 7/28, 7/31 >> 7/25 Vancomycin x1 in ED 7/30 Unasyn >> 7/31 7/31 Flagyl >>  8/1 Vancomycin >> 8/2 8/1 Eraxis >> 8/2   Dose adjustments this admission: 7/31: adjusted cefepime to 2g q12 for CRRT dosing   Microbiology results: 7/24 BCx: NGF 7/25 UCx:  ngf 7/25 MRSA PCR: neg 7/26 C diff: neg 7/31 Trach: Candida tropicalis - discussed w/ ID, likely not pathogenic 8/1 GI panel: neg  Thank you for allowing pharmacy to be a part of this patient's care.  Dimple Nanas, PharmD 12/11/2020 7:06 AM

## 2020-12-11 NOTE — Progress Notes (Signed)
Brief interim progress note  Several days ago pt w new onset Afib RVR. This has been improved after amio. Likely catalyzed by critical illness.  Pt has remained NSR on amio gtt and 8/4 he was able to wean off pressors.   Will trial dc amio. Think there is a fair shot he may remain NSR  If Afib RVR recurs after IV amio dc, can restart IV amio and plan to convert to enteral when in sinus    Eliseo Gum MSN, AGACNP-BC [place] for pager  12/11/2020, 2:38 PM

## 2020-12-11 NOTE — Progress Notes (Signed)
Nutrition Follow-up  DOCUMENTATION CODES:   Non-severe (moderate) malnutrition in context of chronic illness, Obesity unspecified  INTERVENTION:  - increase Vital High Protein to 20 ml/hr at this time and advance by 10 ml/hr every 12 hours to reach goal rate of Vital High Protein @ 60 ml/hr, 45 ml Prosource TF TID. - at goal rate, this regimen will provide 1560 kcal, 159 grams protein, and 1204 ml free water. - free water flush, if desired, to be per CCM.   NUTRITION DIAGNOSIS:   Moderate Malnutrition related to chronic illness, cancer and cancer related treatments as evidenced by mild fat depletion, mild muscle depletion, percent weight loss. -ongoing  GOAL:   Patient will meet greater than or equal to 90% of their needs -unmet with current TF rate  MONITOR:   Vent status, TF tolerance, Labs, Weight trends, Skin, I & O's  ASSESSMENT:   65 y.o. male who is a former with medical history of stage 3 colon cancer s/p partial colectomy and end colostomy on 5/4, recent chemo start with 3rd cycle on 7/22, sleep apnea, seizures, epilepsy, brain tumor, HTN, HLD, GERD, asthma, peripheral neuropathy, urinary frequency, DM, MCA stroke. He presented to the ED via EMS due to abdominal pain x2 weeks, anorexia, and weakness. Admitted for close monitoring for concern of mesenteric ischemia. Patient reported that abdominal pain was worse with eating PTA.  Significant Events: 7/25- admission 7/26- initial RD assessment 7/27- diet advanced to CLD 7/28- diet advanced to Dysphagia 1, thin liquids 7/29- diet downgraded to NPO 7/30- intubation; OGT placement (gastric 7/31- non-tunneled triple lumen HD cath placed in L femoral; CRRT initiation 8/2- repeat CT abdomen/pelvis showed wall thickening and fat stranding of descending and sigmoid colon, non-specific infectious, inflammatory, or ischemic colitis; new consolidative airspace opacity in R lower and R middle lobes 8/3- initiation of trickle rate TF  (Vital High Protein @ 10)   Patient remains intubated with OGT in place. He is receiving Vital High Protein @ 10 ml/hr with 45 ml Prosource TF TID, no free water flush. Able to talk with CCM NP and RN. Patient tolerating TF. Plan to begin advancing at this time.  Weight now consistent with weight on 7/30. Mild pitting edema to all extremities documented in the edema section of flow sheet.  Noted to be +5.75 L since admission.   He remains Full Code. Palliative Care NP spoke with his wife again yesterday afternoon.    Patient is currently intubated on ventilator support MV: 17.9 L/min Temp (24hrs), Avg:98.5 F (36.9 C), Min:97.7 F (36.5 C), Max:98.9 F (37.2 C) Propofol: none BP: 111/57 and MAP: 75 (from A-line)   Labs reviewed; CBGs: 176, 168, 138 mg/dl, creatinine: 1.58 mg/dl, Ca: 7.3 mg/dl, Phos: 2 mg/dl, Mg: 2.5 mg/dl, GFR: 49 ml/min. Medications reviewed; 20 mg IV pepcid/day, 100 mg solu-cortef TID, sliding scale novolog, 30 mmol IV NaPhos x1 run 8/4, 250 mg IV thiamine BID.    Diet Order:   Diet Order             Diet NPO time specified  Diet effective now                   EDUCATION NEEDS:   Not appropriate for education at this time  Skin:  Skin Assessment: Skin Integrity Issues: Skin Integrity Issues:: Stage II Stage II: mid-coccyx  Last BM:  8/4 (20 ml via colostomy this shift)  Height:   Ht Readings from Last 1 Encounters:  12/01/20 5\' 8"  (  1.727 m)    Weight:   Wt Readings from Last 1 Encounters:  12/11/20 116.5 kg     Estimated Nutritional Needs:  Kcal:  1540-1750 kcal Protein:  150-165 grams Fluid:  >/= 2.2 L/day     Jarome Matin, MS, RD, LDN, CNSC Inpatient Clinical Dietitian RD pager # available in AMION  After hours/weekend pager # available in [facility]

## 2020-12-11 NOTE — Progress Notes (Signed)
CCM family communication   Call placed to pts wife to provide updates -- no answer.   Eliseo Gum MSN, AGACNP-BC [facility] 12/11/2020, 2:26 PM

## 2020-12-11 NOTE — Progress Notes (Signed)
Subjective:  CRRT running without incident  BP overall better-  have stopped pressors  was 1 liter negative with CRRT last 24 hours- fio2 down to 50.  Family wants full scope of aggressive care to continue  Objective Vital signs in last 24 hours: Vitals:   12/11/20 0900 12/11/20 1000 12/11/20 1100 12/11/20 1215  BP: 131/65 (!) 122/56 115/65   Pulse: 81 77 74   Resp: (!) 25 (!) 25 (!) 23   Temp:    98.8 F (37.1 C)  TempSrc:    Axillary  SpO2: 99% 99% 100%   Weight:      Height:       Weight change: -1.6 kg  Intake/Output Summary (Last 24 hours) at 12/11/2020 1317 Last data filed at 12/11/2020 1300 Gross per 24 hour  Intake 2960.37 ml  Output 3870.8 ml  Net -910.43 ml    Assessment/ Plan: Pt is a 65 y.o. yo male with DM, epilepsy and also colon CA- s/p chemo who was admitted on 11/30/2020 with Afib with RVR with MSOF in the setting of aspiration PNA-  complicated by AKI Assessment/Plan: 1. Renal-  baseline crt 1.5 to 1.6 A on CRF in the setting of MSOF-  CRRT initiated on 7/31- running well.  Essentially no UOP-  need to continue to support with CRRT-  K 3.8-  ordered repletion-   phos now 2-  will replete again-  no change to CRRT prescription.  No UOP indicates no evidence of renal recovery-  if can get Fio2 down to 40 may consider a CRRT holiday to see what he can do.  As you know- we cannot do IHD-  regular hemodialysis at [facility] 2. HTN/volume-  had to run positive due to hypotension and increased pressor requirements-  not massively overloaded-  will try to pull if gets more stable-- now  will attempt a little more  UF at 50 -100 per hour  3. Pulm-  VDRF-  aspiration PNA-  maxepime /vanc 4. Anemia- supportive care-  given 2 units PRBC 7/31-- hgb improved but drifting down slowly  5. Acidosis-  improved with PH over 7.4-   bicarb OK-  I am going to continue the  bicarb based dialysate.  Lactate decreasing 6. Afib-  on amio-  pretty well rate controlled 7.  Dispo-  critically ill and has  baseline cancer-  family meeting but wishing for aggressive care to continue   [place]: Basic Metabolic Panel: Recent Labs  Lab 12/10/20 0412 12/10/20 1714 12/11/20 0500  NA 138 137 138  K 4.1 3.9 3.8  CL 106 103 105  CO2 20* 20* 21*  GLUCOSE 131* 153* 174*  BUN _0 CREATININE 1.76* 1.56* 1.58*  CALCIUM 7.2* 7.2* 7.3*  PHOS 1.9* 3.2 2.0*   Liver Function Tests: Recent Labs  Lab 12/08/20 1521 12/09/20 0416 12/09/20 0916 12/09/20 1555 12/10/20 0412 12/10/20 1714 12/11/20 0500  AST 43*  --  46*  --   --   --   --   ALT 23  --  23  --   --   --   --   ALKPHOS 61  --  74  --   --   --   --   BILITOT 2.1*  --  2.0*  --   --   --   --   PROT 5.4*  --  5.4*  --   --   --   --  ALBUMIN 1.8*  1.8*   < > 1.7*   < > 1.6* 1.7* 1.7*   < > = values in this interval not displayed.   No results for input(s): LIPASE, AMYLASE in the last 168 hours. No results for input(s): AMMONIA in the last 168 hours. CBC: Recent Labs  Lab 12/05/20 0442 12/06/20 0437 12/06/20 2119 12/07/20 0932 12/08/20 0730 12/09/20 0416 12/10/20 0412 12/11/20 0500  WBC 4.4 4.7 6.1 7.5  --  17.0* 14.9* 14.4*  NEUTROABS 1.8 2.0 3.9  --   --   --   --   --   HGB 9.6* 8.6* 8.8* 7.6*   < > 10.1* 9.3* 9.1*  HCT 27.6* 25.2* 25.6* 21.5*   < > 28.6* 26.3* 26.4*  MCV 77.5* 78.8* 76.9* 74.7*  --  76.7* 77.4* 79.0*  PLT 264 245 295 242  --  161 138* 103*   < > = values in this interval not displayed.   Cardiac Enzymes: No results for input(s): CKTOTAL, CKMB, CKMBINDEX, TROPONINI in the last 168 hours. CBG: Recent Labs  Lab 12/10/20 2011 12/11/20 0016 12/11/20 0423 12/11/20 0739 12/11/20 1210  GLUCAP 158* 176* 168* 138* 166*    Iron Studies: No results for input(s): IRON, TIBC, TRANSFERRIN, FERRITIN in the last 72 hours. Studies/Results: No results found. Medications: Infusions:   prismasol BGK 4/2.5 400 mL/hr at 12/11/20 0841    prismasol BGK 4/2.5 200 mL/hr at  12/10/20 2208   sodium chloride 10 mL/hr at 12/11/20 1300   sodium chloride 10 mL/hr at 12/11/20 1300   amiodarone 30 mg/hr (12/11/20 1300)   ceFEPime (MAXIPIME) IV Stopped (12/11/20 1221)   famotidine (PEPCID) IV Stopped (12/11/20 0053)   fentaNYL infusion INTRAVENOUS 125 mcg/hr (12/11/20 1300)   heparin 10,000 units/ 20 mL infusion syringe 500 Units/hr (12/11/20 0842)   levETIRAcetam Stopped (12/11/20 1139)   methocarbamol (ROBAXIN) IV Stopped (12/11/20 0749)   metronidazole Stopped (12/11/20 1024)   midazolam Stopped (12/10/20 1237)   prismasol BGK 4/2.5 1,800 mL/hr at 12/11/20 1241   promethazine (PHENERGAN) injection (IM or IVPB)     sodium phosphate  Dextrose 5% IVPB 43 mL/hr at 12/11/20 1300   thiamine injection Stopped (12/11/20 1059)    Scheduled Medications:  albuterol  2.5 mg Nebulization QID   chlorhexidine gluconate (MEDLINE KIT)  15 mL Mouth Rinse BID   Chlorhexidine Gluconate Cloth  6 each Topical Daily   clonazepam  0.5 mg Per Tube BID   feeding supplement (PROSource TF)  45 mL Per Tube TID   feeding supplement (VITAL HIGH PROTEIN)  1,000 mL Per Tube Q24H   hydrocerin   Topical BID   hydrocortisone sod succinate (SOLU-CORTEF) inj  100 mg Intravenous Q8H   insulin aspart  1-3 Units Subcutaneous Q4H   mouth rinse  15 mL Mouth Rinse 10 times per day   OLANZapine  5 mg Per Tube QHS    have reviewed scheduled and prn medications.  Physical Exam: General: sedated, intubated Heart: borderline tachy-  irreg  Lungs: CBS bilat Abdomen: obese-  ostomy in RLQ-  liquid stool Extremities:  dep pitting edema Dialysis Access: left femoral HD cath  - 5 days old    12/11/2020,1:17 PM  LOS: 10 days         

## 2020-12-11 NOTE — Progress Notes (Signed)
eLink Physician-Brief Progress Note Patient Name: Thomas [name] DOB: 08-Oct-1955 MRN: [id number]   Date of Service  12/11/2020  HPI/Events of Note  Muscle spasm - Request to reorder Robaxin.   eICU Interventions  Will reoder Robaxin.      Intervention Category Major Interventions: Other:  Lysle Dingwall 12/11/2020, 8:26 PM

## 2020-12-11 NOTE — Progress Notes (Signed)
HEMATOLOGY-ONCOLOGY PROGRESS NOTE  SUBJECTIVE: Thomas [name] remains on the ventilator.  The bedside RN reports he has been more agitated. Oncology History  Colon cancer ([place])  09/23/2020 Initial Diagnosis   Colon cancer ([place])    09/23/2020 Cancer Staging   Staging form: Colon and Rectum, AJCC 8th Edition - Pathologic: Stage IIIB (pT3, pN1a, cM0) - Signed by Ladell Pier, MD on 09/23/2020  Total positive nodes: 1  Histologic grading system: 4 grade system  Histologic grade (G): G1  Residual tumor (R): R0 - None  Lymph-vascular invasion (LVI): LVI not present (absent)/not identified  Tumor deposits (TD): Absent  Perineural invasion (PNI): Absent  Microsatellite instability (MSI): Stable    10/08/2020 -  Chemotherapy    Patient is on Treatment Plan: COLORECTAL XELOX (CAPEOX) Q21D         PHYSICAL EXAMINATION:  Vitals:   12/11/20 0630 12/11/20 0645  BP:    Pulse: 70 69  Resp: (!) 26 (!) 26  Temp:    SpO2: 99% 99%   Filed Weights   12/09/20 0414 12/10/20 0416 12/11/20 0500  Weight: 261 lb 14.5 oz (118.8 kg) 260 lb 5.8 oz (118.1 kg) 256 lb 13.4 oz (116.5 kg)    Intake/Output from previous day: 08/03 0701 - 08/04 0700 In: 2236.3 [I.V.:1071; NG/GT:197.3; IV Piggyback:968] Out: 3272.1 [Stool:750]  GENERAL: intubated, sedated SKIN: Dryness of the palms and soles, erythema with superficial desquamation at the hands and several fingers, superficial skin breakdown at the scrotum-improved, hyperpigmentation and skin thickening at the hands   OROPHARYNX: ETT in place, healing ulcerations at the lower lip Cardiovascular-distant heart sounds  ABDOMEN: Right abdomen colostomy with loose, partially formed stool, soft NEURO: Sedated, moves extremities   Port-A-Cath without erythema  LABORATORY DATA:  I have reviewed the data as listed CMP Latest Ref Rng & Units 12/11/2020 12/10/2020 12/10/2020  Glucose 70 - 99 mg/dL 174(H) 153(H) 131(H)  BUN 8 - 23 mg/dL _0 Creatinine 0.61 - 1.24 mg/dL 1.58(H) 1.56(H) 1.76(H)  Sodium 135 - 145 mmol/L 138 137 138  Potassium 3.5 - 5.1 mmol/L 3.8 3.9 4.1  Chloride 98 - 111 mmol/L 105 103 106  CO2 22 - 32 mmol/L 21(L) 20(L) 20(L)  Calcium 8.9 - 10.3 mg/dL 7.3(L) 7.2(L) 7.2(L)  Total Protein 6.5 - 8.1 g/dL - - -  Total Bilirubin 0.3 - 1.2 mg/dL - - -  Alkaline Phos 38 - 126 U/L - - -  AST 15 - 41 U/L - - -  ALT 0 - 44 U/L - - -    Lab Results  Component Value Date   WBC 14.4 (H) 12/11/2020   HGB 9.1 (L) 12/11/2020   HCT 26.4 (L) 12/11/2020   MCV 79.0 (L) 12/11/2020   PLT 103 (L) 12/11/2020   NEUTROABS 3.9 12/06/2020    CT ABDOMEN PELVIS WO CONTRAST  Result Date: 12/08/2020 CLINICAL DATA:  Abdominal distension, decreasing colostomy output, history of colon cancer status post transverse colectomy EXAM: CT ABDOMEN AND PELVIS WITHOUT CONTRAST TECHNIQUE: Multidetector CT imaging of the abdomen and pelvis was performed following the standard protocol without IV contrast. Oral enteric contrast was administered COMPARISON:  CT chest abdomen pelvis, 12/01/2020 FINDINGS: Lower chest: There is new, dense heterogeneous and consolidative airspace opacity of the right lower and right middle lobes (series 7, image 1). Coronary artery calcifications. Aortic valve calcifications Hepatobiliary: No solid liver abnormality is seen. Hepatic steatosis. No gallstones, gallbladder wall thickening, or biliary dilatation. Pancreas: Unremarkable. No pancreatic ductal dilatation  or surrounding inflammatory changes. Spleen: Normal in size without significant abnormality. Adrenals/Urinary Tract: Adrenal glands are unremarkable. Kidneys are normal, without renal calculi, solid lesion, or hydronephrosis. Bladder is unremarkable. Stomach/Bowel: Esophagogastric tube is position with tip and side port below the diaphragm, however folded in the gastric body, with tip in the gastric fundus (series 2, image 17). Status post transverse colectomy with  right upper quadrant colostomy. The proximal colon as well as the excluded colonic remnant are fluid-filled, and there is inflammatory wall thickening and fat stranding about the descending and sigmoid colon (series 2, image 78). Vascular/Lymphatic: Left femoral venous and arterial catheters. Aortic atherosclerosis. Incidental note of retroaortic left renal vein. No enlarged abdominal or pelvic lymph nodes. Reproductive: No mass or other significant abnormality. Other: Mild anasarca. Fat containing left inguinal hernia. No abdominopelvic ascites. Musculoskeletal: No acute or significant osseous findings. IMPRESSION: 1. 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 2. The proximal colon as well as the excluded colonic remnant are fluid-filled, and there is inflammatory wall thickening and fat stranding about the descending and sigmoid colon. Findings are new compared to prior CT dated 12/01/2020, consistent with nonspecific infectious, inflammatory, or ischemic colitis. 3. There is new, dense heterogeneous and consolidative airspace opacity of the right lower and right middle lobes, consistent with infection or aspiration. 4. Esophagogastric tube is positioned with tip and side port below the diaphragm, however folded in the gastric body, with tip in the gastric fundus. Recommend repositioning. 5. Hepatic steatosis. 6. Coronary artery disease. Aortic Atherosclerosis (ICD10-I70.0). Electronically Signed   By: Eddie Candle M.D.   On: 12/08/2020 17:45   DG Chest 1 View  Result Date: 12/06/2020 CLINICAL DATA:  Shortness of breath. Patient undergoing chemotherapy for stage IIIB colon cancer. EXAM: CHEST  1 VIEW COMPARISON:  December 04, 2020 FINDINGS: Platelike opacity in the left base consistent with atelectasis or scar, stable. New opacity in the right base obscuring the elevated right hemidiaphragm. The heart, hila, and mediastinum are unchanged and unremarkable. The right Port-A-Cath is stable.  No pulmonary nodules or masses. No other abnormalities. IMPRESSION: 1. Stable platelike opacity in the left base consistent with scar or atelectasis. 2. New opacity in the right base obscuring the elevated right hemidiaphragm. The opacity could represent atelectasis versus developing infiltrate. Recommend clinical correlation and short-term follow-up imaging to ensure resolution. Electronically Signed   By: Dorise Bullion III M.D   On: 12/06/2020 11:50   DG Abd 1 View  Result Date: 12/06/2020 CLINICAL DATA:  NG tube placement. EXAM: ABDOMEN - 1 VIEW COMPARISON:  CT 12/01/2020 FINDINGS: Tip and side port of the enteric tube below the diaphragm in the stomach. Generalized paucity of bowel gas in the upper abdomen. IMPRESSION: Tip and side port of the enteric tube below the diaphragm in the stomach. Electronically Signed   By: Keith Rake M.D.   On: 12/06/2020 23:37   CT HEAD WO CONTRAST  Result Date: 12/05/2020 CLINICAL DATA:  Acute neuro deficit, agitation and delirium, history of colon carcinoma EXAM: CT HEAD WITHOUT CONTRAST TECHNIQUE: Contiguous axial images were obtained from the base of the skull through the vertex without intravenous contrast. COMPARISON:  09/08/2016 and previous FINDINGS: Brain: Previous left frontal craniotomy. Stable encephalomalacia in left frontal lobe. Negative for acute intracranial hemorrhage, midline shift, no parenchymal edema, mass or mass effect. No hydrocephalus. Mild diffuse parenchymal atrophy as before. Vascular: Atherosclerotic and physiologic intracranial calcifications. Skull: Changes of left temporal craniotomy. No acute fracture or worrisome bone lesion.  Sinuses/Orbits: No acute finding. Other: None IMPRESSION: 1. Negative for bleed or other acute intracranial process. 2. Stable changes of left frontal craniotomy Electronically Signed   By: Lucrezia Europe M.D.   On: 12/05/2020 11:34   US RENAL  Result Date: 12/06/2020 CLINICAL DATA:  AK I EXAM: RENAL / URINARY  TRACT ULTRASOUND COMPLETE COMPARISON:  Renal ultrasound 10/02/2020 FINDINGS: Right Kidney: Renal measurements: 9.8 x 6.3 x 5.9 cm = volume: 190 mL. Echogenicity within normal limits. No mass or hydronephrosis visualized. Left Kidney: Renal measurements: 11.9 x 7.2 x 5.8 cm = volume: 262 mL. Echogenicity within normal limits. No hydronephrosis. There is a cyst in the lateral aspect of the kidney measuring 4.5 x 4.0 x 4.0 cm. Bladder: Appears decompressed with Foley in place. Other: None. IMPRESSION: No acute abnormality sonographically in the kidneys. Exam is limited by difficulty with patient positioning and shadowing bowel gas. Electronically Signed   By: Audie Pinto M.D.   On: 12/06/2020 11:47   DG CHEST PORT 1 VIEW  Result Date: 12/09/2020 CLINICAL DATA:  65 year old male, evaluate for pneumonia. EXAM: PORTABLE CHEST - 1 VIEW COMPARISON:  12/07/2020 FINDINGS: The patient is rotated to the right, limiting of diagnostic evaluation. Unchanged appearance of right chest wall, internal jugular vein approach Port-A-Cath with the catheter tip in the superior right atrium. Endotracheal tube in place with the tip in the inferior thoracic trachea, just above the carina. Gastric decompression tube courses off the inferior aspect of this image, the tip is visualized in the fundus. The mediastinal contours are obscured, though similar to comparison limits. No cardiomegaly. Hazy opacities in the right lung base, limited evaluation due to projection. No acute osseous abnormality. IMPRESSION: 1. Right lower lobe subsegmental atelectasis versus infiltrate. Limited evaluation due to patient rotation. Recommend attention on follow-up. 2. Support lines and tubes in unchanged, appropriate positions. Electronically Signed   By: Ruthann Cancer MD   On: 12/09/2020 11:15   DG Chest Port 1 View  Result Date: 12/07/2020 CLINICAL DATA:  Oxygen desaturation. EXAM: PORTABLE CHEST 1 VIEW COMPARISON:  Radiograph yesterday.  CT  12/01/2020 FINDINGS: Endotracheal tube tip 2 cm from the carina. Enteric tube tip below the diaphragm not included in the field of view. Right chest port remains in place. Improved aeration of the right lung base with decreased volume loss. Residual patchy basilar opacity. The heart is normal in size. No pulmonary edema, large pleural effusion or pneumothorax. IMPRESSION: 1. Improved aeration of the right lung base with decreased volume loss since radiograph yesterday. Residual patchy right basilar opacity. 2. Stable support apparatus. Electronically Signed   By: Keith Rake M.D.   On: 12/07/2020 22:12   DG CHEST PORT 1 VIEW  Result Date: 12/06/2020 CLINICAL DATA:  Intubation. EXAM: PORTABLE CHEST 1 VIEW COMPARISON:  Radiograph earlier today.  CT 12/01/2020 FINDINGS: Endotracheal tube tip 19 mm from the carina. Enteric tube in place with tip and side-port below the diaphragm. Right chest port unchanged in position. There is progressive volume loss in the right hemithorax with opacification of the lower thorax and rightward mediastinal shift. Improved linear opacity at the left lung base. No pneumothorax. IMPRESSION: 1. Endotracheal tube tip 19 mm from the carina. Enteric tube in place. 2. Progressive volume loss in the right hemithorax with opacification of the lower thorax and rightward mediastinal shift. Findings favor lobar collapse/atelectasis. Linear opacity at the left lung base is improved from earlier today. Electronically Signed   By: Keith Rake M.D.   On:  12/06/2020 23:40   DG CHEST PORT 1 VIEW  Result Date: 12/04/2020 CLINICAL DATA:  Vomiting with possible aspiration. EXAM: PORTABLE CHEST 1 VIEW COMPARISON:  November 30, 2020 FINDINGS: There is stable right-sided venous Port-A-Cath positioning. Mild linear atelectasis is seen within the mid left lung. This represents a new finding when compared to the prior exam. There is no evidence of a pleural effusion or pneumothorax. The heart size and  mediastinal contours are within normal limits. The visualized skeletal structures are unremarkable. IMPRESSION: Interval development of mild linear atelectasis within the mid left lung since the prior study. Electronically Signed   By: Virgina Norfolk M.D.   On: 12/04/2020 01:19   DG Chest Port 1 View  Result Date: 11/30/2020 CLINICAL DATA:  Questionable sepsis. Patient began treatment for colon cancer 3 months ago. Loss of appetite, severe abdominal pain, weakness, and back pain. EXAM: PORTABLE CHEST 1 VIEW COMPARISON:  11/27/2020 FINDINGS: Heart size and pulmonary vascularity are normal. Lungs clear. Power port type central venous catheter with tip over the low SVC region. No pneumothorax. No pleural effusions. Mediastinal contours appear intact. IMPRESSION: No active disease. Electronically Signed   By: Lucienne Capers M.D.   On: 11/30/2020 22:36   DG Chest Port 1 View  Result Date: 11/27/2020 CLINICAL DATA:  Weakness.  Fall today. EXAM: PORTABLE CHEST 1 VIEW COMPARISON:  Radiograph 11/15/2020 FINDINGS: Right chest port unchanged in position. Normal heart size and mediastinal contours. No acute or focal airspace disease no pleural fluid, pulmonary edema, or pneumothorax. No acute osseous abnormalities are seen. IMPRESSION: No acute chest findings. Electronically Signed   By: Keith Rake M.D.   On: 11/27/2020 22:09   DG Chest Portable 1 View  Result Date: 11/15/2020 CLINICAL DATA:  Fatigue, colon cancer EXAM: PORTABLE CHEST 1 VIEW COMPARISON:  10/01/2020 FINDINGS: Right Port-A-Cath remains in place, unchanged. Heart and mediastinal contours are within normal limits. No focal opacities or effusions. No acute bony abnormality. IMPRESSION: No active disease. Electronically Signed   By: Rolm Baptise M.D.   On: 11/15/2020 14:34   EEG adult  Result Date: 12/05/2020 Lora Havens, MD     12/05/2020  6:19 PM Patient Name: Thomas [name] MRN: [id number] Epilepsy Attending: Lora Havens Referring  Physician/Provider: Dr Sander Radon Date: 12/05/2020 Duration: 23.18 mins Patient history: 65yo M withh/o epilepsy presented with ams. EEG to evaluate for seizure Level of alertness: Awake AEDs during EEG study: LEV Technical aspects: This EEG study was done with scalp electrodes positioned according to the 10-20 International system of electrode placement. Electrical activity was acquired at a sampling rate of _0  and reviewed with a high frequency filter of _1  and a low frequency filter of _2 . EEG data were recorded continuously and digitally stored. Description: No posterior dominant rhythm was seen. EEG showed continuous generalized 3 to 6 Hz theta-delta slowing. There is also 3-_3  sharply contoured theta-delta slowing with overriding 13-_4  beta activity in left frontotemporal region consistent with breach artifact.  Hyperventilation and photic stimulation were not performed.   ABNORMALITY - Continuous slow, generalized - Breach artifact, left frontotemporal region IMPRESSION: This study is suggestive of cortical dysfunction arising from left frontotemporal region likely secondary to underlying encephalomalacia as well as prior craniotomy. There is also moderate diffuse encephalopathy, nonspecific etiology. No seizures or definite epileptiform discharges were seen throughout the recording. Lora Havens   ECHOCARDIOGRAM COMPLETE  Result Date: 12/02/2020    ECHOCARDIOGRAM REPORT   Patient Name:   Thomas [name]  Date of Exam: 12/02/2020 Medical Rec #:  [id number]    Height:       68.0 in Accession #:    [id number]   Weight:       246.5 lb Date of Birth:  02-Mar-1956    BSA:          2.234 m Patient Age:    29 years     BP:           136/77 mmHg Patient Gender: M            HR:           92 bpm. Exam Location:  Inpatient Procedure: 2D Echo, Color Doppler and Cardiac Doppler Indications:    I48.91* Unspecified atrial fibrillation  History:        Patient has no prior history of Echocardiogram  examinations.                 Arrythmias:Atrial Fibrillation; Risk Factors:Hypertension,                 Diabetes, Dyslipidemia and Sleep Apnea.  Sonographer:    Raquel Sarna Senior RDCS Referring Phys: [id number] [facility]  Sonographer Comments: Technically difficult due to patient body habitus. IMPRESSIONS  1. Mild intracavitary gradient. Peak velocity 1.37 m/s. Peak gradient 7.5 mmHg. Left ventricular ejection fraction, by estimation, is 60 to 65%. The left ventricle has normal function. The left ventricle has no regional wall motion abnormalities. There is moderate concentric left ventricular hypertrophy. Left ventricular diastolic parameters are consistent with Grade I diastolic dysfunction (impaired relaxation).  2. Right ventricular systolic function is normal. The right ventricular size is normal. There is normal pulmonary artery systolic pressure.  3. The mitral valve is normal in structure. No evidence of mitral valve regurgitation. No evidence of mitral stenosis.  4. The aortic valve is calcified. There is moderate calcification of the aortic valve. Aortic valve regurgitation is not visualized. No aortic stenosis is present.  5. The inferior vena cava is normal in size with greater than 50% respiratory variability, suggesting right atrial pressure of 3 mmHg. FINDINGS  Left Ventricle: Mild intracavitary gradient. Peak velocity 1.37 m/s. Peak gradient 7.5 mmHg. Left ventricular ejection fraction, by estimation, is 60 to 65%. The left ventricle has normal function. The left ventricle has no regional wall motion abnormalities. The left ventricular internal cavity size was normal in size. There is moderate concentric left ventricular hypertrophy. Left ventricular diastolic parameters are consistent with Grade I diastolic dysfunction (impaired relaxation). Indeterminate filling pressures. Right Ventricle: The right ventricular size is normal. No increase in right ventricular wall thickness. Right ventricular systolic  function is normal. There is normal pulmonary artery systolic pressure. The tricuspid regurgitant velocity is 1.72 m/s, and  with an assumed right atrial pressure of 8 mmHg, the estimated right ventricular systolic pressure is 44.9 mmHg. Left Atrium: Left atrial size was normal in size. Right Atrium: Right atrial size was normal in size. Pericardium: There is no evidence of pericardial effusion. Mitral Valve: The mitral valve is normal in structure. No evidence of mitral valve regurgitation. No evidence of mitral valve stenosis. Tricuspid Valve: The tricuspid valve is normal in structure. Tricuspid valve regurgitation is trivial. No evidence of tricuspid stenosis. Aortic Valve: The aortic valve is calcified. There is moderate calcification of the aortic valve. Aortic valve regurgitation is not visualized. No aortic stenosis is present. Pulmonic Valve: The pulmonic valve was normal in structure. Pulmonic valve regurgitation is not visualized. No evidence of pulmonic  stenosis. Aorta: The aortic root is normal in size and structure. Venous: The inferior vena cava is normal in size with greater than 50% respiratory variability, suggesting right atrial pressure of 3 mmHg. IAS/Shunts: No atrial level shunt detected by color flow Doppler.  LEFT VENTRICLE PLAX 2D LVIDd:         3.20 cm  Diastology LVIDs:         2.10 cm  LV e' medial:    4.68 cm/s LV PW:         1.30 cm  LV E/e' medial:  11.5 LV IVS:        1.50 cm  LV e' lateral:   6.85 cm/s LVOT diam:     2.00 cm  LV E/e' lateral: 7.9 LV SV:         43 LV SV Index:   19 LVOT Area:     3.14 cm  RIGHT VENTRICLE RV S prime:     13.50 cm/s TAPSE (M-mode): 1.6 cm LEFT ATRIUM             Index       RIGHT ATRIUM           Index LA diam:        3.20 cm 1.43 cm/m  RA Area:     12.50 cm LA Vol (A2C):   46.5 ml 20.82 ml/m RA Volume:   26.60 ml  11.91 ml/m LA Vol (A4C):   33.6 ml 15.04 ml/m LA Biplane Vol: 41.5 ml 18.58 ml/m  AORTIC VALVE LVOT Vmax:   98.65 cm/s LVOT Vmean:   79.600 cm/s LVOT VTI:    0.138 m  AORTA Ao Root diam: 3.10 cm MITRAL VALVE                TRICUSPID VALVE MV Area (PHT): 3.10 cm     TR Peak grad:   11.8 mmHg MV Decel Time: 245 msec     TR Vmax:        172.00 cm/s MV E velocity: 54.00 cm/s MV A velocity: 102.00 cm/s  SHUNTS MV E/A ratio:  0.53         Systemic VTI:  0.14 m                             Systemic Diam: 2.00 cm Skeet Latch MD Electronically signed by Skeet Latch MD Signature Date/Time: 12/02/2020/5:47:16 PM    Final    CT CHEST ABDOMEN PELVIS WO CONTRAST  Result Date: 12/01/2020 CLINICAL DATA:  Abdominal pain and fever EXAM: CT CHEST, ABDOMEN AND PELVIS WITHOUT CONTRAST TECHNIQUE: Multidetector CT imaging of the chest, abdomen and pelvis was performed following the standard protocol without IV contrast. COMPARISON:  09/04/2020 FINDINGS: CT CHEST FINDINGS Cardiovascular: Calcific atherosclerosis of the aorta and coronary arteries. No pericardial effusion. Course and caliber of the aorta are normal. Right chest wall Port-A-Cath tip is at the cavoatrial junction. Mediastinum/Nodes: No enlarged mediastinal, hilar, or axillary lymph nodes. Thyroid gland, trachea, and esophagus demonstrate no significant findings. Lungs/Pleura: Lungs are clear. No pleural effusion or pneumothorax. Musculoskeletal: No chest wall mass or suspicious bone lesions identified. CT ABDOMEN PELVIS FINDINGS Hepatobiliary: No focal liver abnormality is seen. No gallstones, gallbladder wall thickening, or biliary dilatation. Pancreas: Unremarkable. No pancreatic ductal dilatation or surrounding inflammatory changes. Spleen: Normal in size without focal abnormality. Adrenals/Urinary Tract: Adrenal glands are unremarkable. Kidneys are normal, without renal calculi, focal lesion, or hydronephrosis. Bladder is  unremarkable. 3.5 cm left renal cyst. Stomach/Bowel: Normal appearance of the stomach and duodenum. No small bowel dilatation. 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  Status post partial colectomy. Vascular/Lymphatic: Aortic atherosclerosis. No enlarged abdominal or pelvic lymph nodes. Reproductive: Prostate is unremarkable. Other: Bilateral fat containing inguinal hernias. Musculoskeletal: No acute or significant osseous findings. IMPRESSION: 1. No acute abnormality of the chest, abdomen or pelvis. 2. Status post partial colectomy.  No evidence of obstruction. Aortic Atherosclerosis (ICD10-I70.0). Electronically Signed   By: Ulyses Jarred M.D.   On: 12/01/2020 02:20    ASSESSMENT AND PLAN: Transverse colon cancer, stage IIIb (T3N1a), status post a partial transverse colectomy and end colostomy 09/10/2020 CT abdomen/pelvis 09/04/2020- possible transverse colon mass with small regional lymph nodes Colonoscopy 09/05/2020- obstructing transverse colon mass-biopsy invasive adenocarcinoma, mass could not be passed CT chest 09/09/2020-no evidence of metastatic disease, 3 x 2 mm subpleural nodule in the right upper lobe likely benign subpleural lymph node Partial transverse colectomy 09/10/2020, transverse colon tumor, no lymphovascular perineural invasion, 1/19 lymph nodes positive, negative margins, MSI stable, no loss of mismatch repair protein expression Cycle 1 CAPOX 10/08/2020, oxaliplatin dose reduced to 100 mg per metered squared secondary to renal insufficiency Cycle 2 CAPOX 10/29/2020 Cycle 3 CAPOX 11/19/2020, capecitabine dose reduced secondary to hand/foot syndrome, oxaliplatin dose reduced secondary to renal failure and poor performance status CT chest/abdomen/pelvis without contrast 12/01/2020-no acute abnormality in the chest, abdomen, or pelvis.  No evidence of obstruction. Resection of a frontal meningioma 09/01/2016 Diabetes Rectal polyp- tubular adenoma on colonoscopy 09/05/2020 Hypertension Diabetic neuropathy Epilepsy Family history of breast and prostate cancer Admission with acute renal failure 10/01/2020-secondary to lack of oral intake and lisinopril,  improved with intravenous hydration and holding lisinopril Admission 12/01/2020-sepsis, diarrhea HHS, A. fib with RVR, abdominal pain 11.  Respiratory failure requiring intubation 12/06/2020 12.  Progressive renal failure-CRRT started 12/06/2020 13.  Thrombocytopenia secondary to chemotherapy and sepsis  Mr. Zidek remains sedated on the ventilator.  His oxygenation appears adequate and he is no longer on pressor support.  Minimal urine output over the past few days.  The skin breakdown at the hands appears to be slowly improving.  He continues to have liquid output from the ostomy.  This has slowed.  The platelet count has decreased over the past 2 days.  This is most likely secondary to chemotherapy and sepsis.  I would consider HIT if the platelet count falls further.  Recommendations: 1.  Continue management of sepsis syndrome and respiratory failure per critical care medicine 2.  Moisturizers, skin care to the hands and scrotum 3.  Management of renal failure/fluids per nephrology 4.  Check platelet count daily, HIT panel if platelets fall again 5.  Please call oncology as needed, I will be out until 12/15/2020, I will asked Dr. Marin Olp to check on him 12/12/2020    LOS: 10 days   Betsy Coder, MD 12/11/20

## 2020-12-11 NOTE — Progress Notes (Addendum)
NAME:  Thomas [name], MRN:  [id number], DOB:  03-26-1956, LOS: 64 ADMISSION DATE:  11/30/2020, CONSULTATION DATE:  7/30 REFERRING MD:  Riccardo Dubin, CHIEF COMPLAINT:  Worsening hypoxia/dyspnea  History of Present Illness:  65 yo M admitted for worsening sepsis vs reaction to chemotherapy with high ostomy output and soft BP on 7/25. Has had issues during his hospitalization with recurrent aspirations and more recently worsening respiratory status.  Most recently pt developed worsening hypoxia and was requiring BIPAP w/ FIO2 of 100% and increased work of breathing. Unable to obtain ABG, CXR in AM of 7/30 showed some worsening concern of aspiration.   Who was admitted to intensive care and subsequently had worsening clinical status requiring intubation, CRRT, multiple pressors and pneumonia along with CT findings 8/1 concerning for ischemic bowel.  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  Hx of remote MCA stroke Asthma hx as a child Epilepsy on keppra GERD T2DM on metformin as outpt HTN HLD OSA  Significant Hospital Events:  7/27 Transferred to [facility], stopped abx as sepsis resolved/ruled out 7/30 AM Worsened AME, more hypoxic with worsening renal funciton, new aspiration on abx restarted 7/30 PM Pt with increased o2 requirement on 100% FIO2 on BIPAP, d/w family who wanted to proceed with intubation, pt intubated 8/1 Some issues with tachycardia and hypotension overnight. Reproted high ostomy output  8/2 down to 9 mcg of Levophed, however repeat CT abdomen/pelvis showed inflammatory wall thickening and fat stranding of the descending and sigmoid colon, new since prior, consistent with nonspecific infectious, inflammatory, or ischemic colitis.  Also new dense consolidative airspace opacity in the right lower and right middle lobes 8/3  further weaning NE. Weaning sedation. Full code, full scope per family meeting 8/2  8/4 off pressors, plt down to 103.   Micro: Resp viral panel 7/24 > neg covid/ flu BC  7/24  x 2 neg  MRSA  7/25 neg  UC   7/25  neg  C diff screeen 7/26  neg ET culture 7/31 > moderate yeast> GI panel  8/1> neg   Abx: Unasyn 7/30 >>7/31 Maxepime 7/31 >> Flagyl 7/31 >> Vancomycin 8/1>> Anidulafungin 8/1>>8/3  Interim History / Subjective:  Off pressors Off versed gtt Weaning fent   Plt have decr further to 103 Objective   Blood pressure (!) 111/56, pulse 72, temperature 98.5 F (36.9 C), temperature source Axillary, resp. rate (!) 22, height 5\' 8"  (1.727 m), weight 116.5 kg, SpO2 100 %.    Vent Mode: PRVC FiO2 (%):  [50 %-75 %] 50 % Set Rate:  [26 bmp] 26 bmp Vt Set:  [600 mL] 600 mL PEEP:  [8 cmH20] 8 cmH20 Plateau Pressure:  [16 cmH20-24 cmH20] 16 cmH20   Intake/Output Summary (Last 24 hours) at 12/11/2020 0847 Last data filed at 12/11/2020 0700 Gross per 24 hour  Intake 2241.94 ml  Output 3275.8 ml  Net -1033.86 ml   Filed Weights   12/09/20 0414 12/10/20 0416 12/11/20 0500  Weight: 118.8 kg 118.1 kg 116.5 kg   General: Critically ill appearing M, appears older than stated age, obese, intubated sedated nad  HEENT:  NCAT pink tacky mm anicteric sclera ETT secure  Neuro: Sedated, opens eyes to stimulation. Moving BUE BLE spontaneously CV: rrr s1s2 cap refill brisk  PULM: Symmetrical chest expansion. Scattered rhonchi. Diminished basilar sounds. Mechanically ventilated  GI:  R  sided ostomy. Round soft abdomen  Extremities: Pitting edema BUE BLE. Femoral trialysis. No acute joint deformity  Skin: Palmar surface of hands/fingers with dry cracked skin, dry skin sloughing, underlying dry red skin.   Resolved Hospital Problem list     Assessment & Plan:   Acute metabolic encephalopathy Hx seizure disorder Hx L ischemic CVA (2015) P: -Keppra -sz precautions -wean sedation as  able   Acute hypoxic respiratory failure Aspiration PNA Candida tropicalis on trach aspirate but not felt likely to be pathogenic  Diaphragmatic spasms P: -VAP, pulm hygiene -wean vent settings as able. Not yet appropriate for SBT as of 8/4  -8/4 is ETT day 5 -- would favor early trach in this pt if we are unable to wean to extubate  -PRN robaxin, will schedule enteral clonazepam   Septic shock due to aspiration PNA and colitis - improving shock -recurrent aspiration. CT a/p with inflammatory changes c/w colitis  P: -cont broad abx, with coverage for colitis (on cefepime, flagyl)  -dc pressors  -trickle EN started 8/3 -- will try to progress 8/4, appreciate RDN assistance  -serial abdominal exam, monitor ostomy outpt  AKI on CKD3a requiring CRRT Hypophosphatemia, mild Continued extremely minimal urine output Likely ATN in the setting of shock P: -CRRT per nephro  -renal fxn panel  -Bladder scan, I/O PRN   NAGMA, improving Lactic acidosis -LA started to decr 8/2 and clinical picture was improving, as such pretty low yield to cont trending LA P -septic shock, AKI on CKD as above   Stg IIIb transverse colon cancer (H0W2B) S/p partial transverse colectomy, end colectomy On CAPOX (s/p 3 cycles 6/1, 6/22. 7/13) -goal curative  P: -Follows with Dr. Johnnette Litter RVR - new onset, improved. HFpEF -Echocardiogram diastolic dysfunction and EF 60 to 65% -afib probably onset in setting of acute illness P: -Has stayed in NSR on amio -- will d/w pharm about converting to PO vs trial off of amio  -dont think there is a great role for systemic AC at present, is in NSR (initially eliquis, then changed to hep gtt when NPO, held in setting of elevated PTT. Also consider plt as below)  Thrombocytopenia  -likely this is chemo and critical illness related P -cont daily CBC -if we cont to drop despite overall clinical improvements, could consider HIT panel   Hx HTN Hx HLD -Home  medication includes: Norvasc, Lipitor,  P: -holding home meds  DM2 with hyperglycemia HHS - improved  -Home medications include Metformin  P: -Continue SSI and every 4 hours CBG  Class II obesity Moderate kcal/protein malnutrition P: -EN per RDN   Stg II coccyx pressure injury - poa P: -turn, pressure alleviation, wound care  Capecitabine induced desquamation: hand/foot syndrome - improving -superficial dry sloughing, areas of cracking, with underlying dry beefy erythematous discoloration  P: -moisturizer to hands feet scrotum   Goals of care  P: -interdisciplinary GOC mtg 8/2 -- family wishes for full code full scope of offered interventions   Best Practice  Diet/type: NPO DVT prophylaxis: SCD GI prophylaxis: H2B Lines: Port, femoral trialysis, arterial line  Foley:  N/A Code Status:  full code Last date of goals of care discussion : 8/2 : PCCM Palli, RN, family -- full code full scope    CRITICAL CARE Performed by: Cristal Generous   Total critical care time: 46 minutes  Critical care time was exclusive of separately billable procedures and treating other patients.  Critical care was necessary to treat  or prevent imminent or life-threatening deterioration.  Critical care was time spent personally by me on the following activities: development of treatment plan with patient and/or surrogate as well as nursing, discussions with consultants, evaluation of patient's response to treatment, examination of patient, obtaining history from patient or surrogate, ordering and performing treatments and interventions, ordering and review of laboratory studies, ordering and review of radiographic studies, pulse oximetry and re-evaluation of patient's condition.  Eliseo Gum MSN, AGACNP-BC [facility] for pager 12/11/2020, 8:47 AM  Xxxxxxxxxxxxxxxx   ATTESTATION & SIGNATURE   STAFF NOTE: I, Dr Ann Lions have personally reviewed  patient's available data, including medical history, events of note, physical examination and test results as part of my evaluation. I have discussed with resident/NP and other care providers such as pharmacist, RN and RRT.  In addition,  I personally evaluated patient and elicited key findings of   S: Date of admit 11/30/2020 with LOS 10 for today 12/11/2020 : Thomas [name] is  -remains on the ventilator 50% FiO2.  Completely anuric.  On CRRT.  On fentanyl infusion, amnioinfusion.  He is on Robaxin.  He is on Flagyl.  He is on tube feeds.  He seems to be off pressors.  O:  Blood pressure (!) 122/56, pulse 77, temperature 98.5 F (36.9 C), temperature source Axillary, resp. rate (!) 25, height 5\' 8"  (1.727 m), weight 116.5 kg, SpO2 99 %.   RASS sedation score -3 Critically ill looking Clear to auscultation bilaterally abdomen soft Colostomy present.     A: Septic shock multiorgan failure.  Respiratory failure, renal failure.  P: Continue full ICU care Full code Likely heading towards tracheostomy, LTAC    Rest per NP/medical resident whose note is outlined above and that I agree with  The patient is critically ill with multiple organ systems failure and requires high complexity decision making for assessment and support, frequent evaluation and titration of therapies, application of advanced monitoring technologies and extensive interpretation of multiple databases.   Critical Care Time devoted to patient care services described in this note is  15  Minutes. This time reflects time of care of this signee Dr Brand Males. This critical care time does not reflect procedure time, or teaching time or supervisory time of PA/NP/Med student/Med Resident etc but could involve care discussion time     Dr. Brand Males, M.D., [facility].C.P Pulmonary and Critical Care Medicine Staff Physician [facility] Pager: [phone], If no answer or between   15:00h - 7:00h: call [phone]  12/11/2020 11:23 AM

## 2020-12-12 ENCOUNTER — Inpatient Hospital Stay (HOSPITAL_COMMUNITY): Payer: Medicare HMO

## 2020-12-12 DIAGNOSIS — A419 Sepsis, unspecified organism: Secondary | ICD-10-CM | POA: Diagnosis not present

## 2020-12-12 DIAGNOSIS — I82411 Acute embolism and thrombosis of right femoral vein: Secondary | ICD-10-CM | POA: Diagnosis not present

## 2020-12-12 DIAGNOSIS — R509 Fever, unspecified: Secondary | ICD-10-CM

## 2020-12-12 DIAGNOSIS — R609 Edema, unspecified: Secondary | ICD-10-CM

## 2020-12-12 DIAGNOSIS — J9601 Acute respiratory failure with hypoxia: Secondary | ICD-10-CM | POA: Diagnosis not present

## 2020-12-12 DIAGNOSIS — D696 Thrombocytopenia, unspecified: Secondary | ICD-10-CM

## 2020-12-12 DIAGNOSIS — R6520 Severe sepsis without septic shock: Secondary | ICD-10-CM

## 2020-12-12 DIAGNOSIS — R109 Unspecified abdominal pain: Secondary | ICD-10-CM | POA: Diagnosis not present

## 2020-12-12 DIAGNOSIS — G9341 Metabolic encephalopathy: Secondary | ICD-10-CM | POA: Diagnosis not present

## 2020-12-12 DIAGNOSIS — N179 Acute kidney failure, unspecified: Secondary | ICD-10-CM | POA: Diagnosis not present

## 2020-12-12 DIAGNOSIS — E1100 Type 2 diabetes mellitus with hyperosmolarity without nonketotic hyperglycemic-hyperosmolar coma (NKHHC): Secondary | ICD-10-CM | POA: Diagnosis not present

## 2020-12-12 LAB — RENAL FUNCTION PANEL
Albumin: 1.7 g/dL — ABNORMAL LOW (ref 3.5–5.0)
Albumin: 1.9 g/dL — ABNORMAL LOW (ref 3.5–5.0)
Anion gap: 10 (ref 5–15)
Anion gap: 11 (ref 5–15)
BUN: 20 mg/dL (ref 8–23)
BUN: 23 mg/dL (ref 8–23)
CO2: 21 mmol/L — ABNORMAL LOW (ref 22–32)
CO2: 22 mmol/L (ref 22–32)
Calcium: 7.2 mg/dL — ABNORMAL LOW (ref 8.9–10.3)
Calcium: 7.1 mg/dL — ABNORMAL LOW (ref 8.9–10.3)
Chloride: 106 mmol/L (ref 98–111)
Chloride: 103 mmol/L (ref 98–111)
Creatinine, Ser: 1.38 mg/dL — ABNORMAL HIGH (ref 0.61–1.24)
Creatinine, Ser: 1.46 mg/dL — ABNORMAL HIGH (ref 0.61–1.24)
GFR, Estimated: 57 mL/min — ABNORMAL LOW (ref >60)
GFR, Estimated: 53 mL/min — ABNORMAL LOW (ref >60)
Glucose, Bld: 191 mg/dL — ABNORMAL HIGH (ref 70–99)
Glucose, Bld: 242 mg/dL — ABNORMAL HIGH (ref 70–99)
Phosphorus: 1.8 mg/dL — ABNORMAL LOW (ref 2.5–4.6)
Phosphorus: 2.4 mg/dL — ABNORMAL LOW (ref 2.5–4.6)
Potassium: 3.9 mmol/L (ref 3.5–5.1)
Potassium: 4.2 mmol/L (ref 3.5–5.1)
Sodium: 137 mmol/L (ref 135–145)
Sodium: 136 mmol/L (ref 135–145)

## 2020-12-12 LAB — CBC WITH DIFFERENTIAL/PLATELET
Abs Immature Granulocytes: 1.63 10*3/uL — ABNORMAL HIGH (ref 0.00–0.07)
Basophils Absolute: 0.1 10*3/uL (ref 0.0–0.1)
Basophils Relative: 1 %
Eosinophils Absolute: 0 10*3/uL (ref 0.0–0.5)
Eosinophils Relative: 0 %
HCT: 26.2 % — ABNORMAL LOW (ref 39.0–52.0)
Hemoglobin: 8.9 g/dL — ABNORMAL LOW (ref 13.0–17.0)
Immature Granulocytes: 12 %
Lymphocytes Relative: 7 %
Lymphs Abs: 0.9 10*3/uL (ref 0.7–4.0)
MCH: 27.6 pg (ref 26.0–34.0)
MCHC: 34 g/dL (ref 30.0–36.0)
MCV: 81.1 fL (ref 80.0–100.0)
Monocytes Absolute: 0.9 10*3/uL (ref 0.1–1.0)
Monocytes Relative: 6 %
Neutro Abs: 10.1 10*3/uL — ABNORMAL HIGH (ref 1.7–7.7)
Neutrophils Relative %: 74 %
Platelets: 90 10*3/uL — ABNORMAL LOW (ref 150–400)
RBC: 3.23 MIL/uL — ABNORMAL LOW (ref 4.22–5.81)
RDW: 24.3 % — ABNORMAL HIGH (ref 11.5–15.5)
WBC: 13.6 10*3/uL — ABNORMAL HIGH (ref 4.0–10.5)
nRBC: 3.9 % — ABNORMAL HIGH (ref 0.0–0.2)

## 2020-12-12 LAB — GLUCOSE, CAPILLARY
Glucose-Capillary: 188 mg/dL — ABNORMAL HIGH (ref 70–99)
Glucose-Capillary: 194 mg/dL — ABNORMAL HIGH (ref 70–99)
Glucose-Capillary: 205 mg/dL — ABNORMAL HIGH (ref 70–99)
Glucose-Capillary: 210 mg/dL — ABNORMAL HIGH (ref 70–99)
Glucose-Capillary: 229 mg/dL — ABNORMAL HIGH (ref 70–99)
Glucose-Capillary: 258 mg/dL — ABNORMAL HIGH (ref 70–99)

## 2020-12-12 LAB — MAGNESIUM: Magnesium: 2.3 mg/dL (ref 1.7–2.4)

## 2020-12-12 LAB — APTT
aPTT: 41 seconds — ABNORMAL HIGH (ref 24–36)
aPTT: 47 seconds — ABNORMAL HIGH (ref 24–36)

## 2020-12-12 MED ORDER — DOCUSATE SODIUM 50 MG/5ML PO LIQD
100.0000 mg | Freq: Two times a day (BID) | ORAL | Status: DC
  Administered 2020-12-15 – 2020-12-21 (×6): 100 mg
  Filled 2020-12-12 (×7): qty 10

## 2020-12-12 MED ORDER — FENTANYL CITRATE (PF) 100 MCG/2ML IJ SOLN
50.0000 ug | INTRAMUSCULAR | Status: DC | PRN
  Filled 2020-12-12: qty 2

## 2020-12-12 MED ORDER — ANTICOAGULANT SODIUM CITRATE 4% (200MG/5ML) IV SOLN
5.0000 mL | Status: DC | PRN
  Filled 2020-12-12 (×2): qty 5

## 2020-12-12 MED ORDER — SODIUM PHOSPHATES 45 MMOLE/15ML IV SOLN
10.0000 mmol | Freq: Once | INTRAVENOUS | Status: AC
  Administered 2020-12-12: 10 mmol via INTRAVENOUS
  Filled 2020-12-12: qty 3.33

## 2020-12-12 MED ORDER — HEPARIN SODIUM (PORCINE) 1000 UNIT/ML DIALYSIS: 1000.0000 [IU] | INTRAMUSCULAR | Status: DC | PRN

## 2020-12-12 MED ORDER — FENTANYL CITRATE (PF) 100 MCG/2ML IJ SOLN
50.0000 ug | INTRAMUSCULAR | Status: DC | PRN
  Administered 2020-12-12 – 2020-12-13 (×3): 100 ug via INTRAVENOUS
  Administered 2020-12-13: 150 ug via INTRAVENOUS
  Administered 2020-12-13: 200 ug via INTRAVENOUS
  Administered 2020-12-13: 100 ug via INTRAVENOUS
  Administered 2020-12-14: 150 ug via INTRAVENOUS
  Administered 2020-12-14 (×6): 100 ug via INTRAVENOUS
  Administered 2020-12-14: 150 ug via INTRAVENOUS
  Administered 2020-12-14 – 2020-12-18 (×19): 100 ug via INTRAVENOUS
  Administered 2020-12-18: 50 ug via INTRAVENOUS
  Administered 2020-12-18 (×2): 100 ug via INTRAVENOUS
  Administered 2020-12-18: 50 ug via INTRAVENOUS
  Administered 2020-12-18 – 2020-12-21 (×3): 100 ug via INTRAVENOUS
  Filled 2020-12-12: qty 2
  Filled 2020-12-12: qty 4
  Filled 2020-12-12 (×3): qty 2
  Filled 2020-12-12: qty 4
  Filled 2020-12-12 (×12): qty 2
  Filled 2020-12-12: qty 4
  Filled 2020-12-12 (×17): qty 2
  Filled 2020-12-12: qty 4

## 2020-12-12 MED ORDER — DEXMEDETOMIDINE HCL IN NACL 200 MCG/50ML IV SOLN
0.0000 ug/kg/h | INTRAVENOUS | Status: DC
  Administered 2020-12-12 (×2): 0.6 ug/kg/h via INTRAVENOUS
  Administered 2020-12-12: 0.3 ug/kg/h via INTRAVENOUS
  Administered 2020-12-12: 0.4 ug/kg/h via INTRAVENOUS
  Administered 2020-12-13: 0.5 ug/kg/h via INTRAVENOUS
  Administered 2020-12-13: 0.7 ug/kg/h via INTRAVENOUS
  Administered 2020-12-13: 0.6 ug/kg/h via INTRAVENOUS
  Filled 2020-12-12 (×7): qty 50

## 2020-12-12 MED ORDER — INSULIN ASPART 100 UNIT/ML IJ SOLN
0.0000 [IU] | INTRAMUSCULAR | Status: DC
  Administered 2020-12-12: 5 [IU] via SUBCUTANEOUS
  Administered 2020-12-12 – 2020-12-13 (×2): 8 [IU] via SUBCUTANEOUS
  Administered 2020-12-13: 11 [IU] via SUBCUTANEOUS
  Administered 2020-12-13: 5 [IU] via SUBCUTANEOUS
  Administered 2020-12-13: 3 [IU] via SUBCUTANEOUS
  Administered 2020-12-13: 11 [IU] via SUBCUTANEOUS
  Administered 2020-12-13: 8 [IU] via SUBCUTANEOUS
  Administered 2020-12-14 (×4): 11 [IU] via SUBCUTANEOUS
  Administered 2020-12-14: 8 [IU] via SUBCUTANEOUS
  Administered 2020-12-14: 11 [IU] via SUBCUTANEOUS
  Administered 2020-12-14: 8 [IU] via SUBCUTANEOUS
  Administered 2020-12-15: 5 [IU] via SUBCUTANEOUS
  Administered 2020-12-15: 11 [IU] via SUBCUTANEOUS
  Administered 2020-12-15: 15 [IU] via SUBCUTANEOUS
  Administered 2020-12-15 – 2020-12-16 (×3): 5 [IU] via SUBCUTANEOUS
  Administered 2020-12-16: 15 [IU] via SUBCUTANEOUS
  Administered 2020-12-16: 8 [IU] via SUBCUTANEOUS
  Administered 2020-12-16: 11 [IU] via SUBCUTANEOUS
  Administered 2020-12-16: 5 [IU] via SUBCUTANEOUS
  Administered 2020-12-16: 3 [IU] via SUBCUTANEOUS
  Administered 2020-12-16: 8 [IU] via SUBCUTANEOUS
  Administered 2020-12-17: 15 [IU] via SUBCUTANEOUS
  Administered 2020-12-17 (×2): 8 [IU] via SUBCUTANEOUS
  Administered 2020-12-17: 3 [IU] via SUBCUTANEOUS
  Administered 2020-12-17: 8 [IU] via SUBCUTANEOUS
  Administered 2020-12-18: 3 [IU] via SUBCUTANEOUS
  Administered 2020-12-18: 5 [IU] via SUBCUTANEOUS
  Administered 2020-12-18: 3 [IU] via SUBCUTANEOUS
  Administered 2020-12-18: 5 [IU] via SUBCUTANEOUS
  Administered 2020-12-18: 8 [IU] via SUBCUTANEOUS
  Administered 2020-12-18: 2 [IU] via SUBCUTANEOUS
  Administered 2020-12-19: 5 [IU] via SUBCUTANEOUS
  Administered 2020-12-19: 2 [IU] via SUBCUTANEOUS
  Administered 2020-12-19: 8 [IU] via SUBCUTANEOUS
  Administered 2020-12-20: 11 [IU] via SUBCUTANEOUS
  Administered 2020-12-20: 5 [IU] via SUBCUTANEOUS
  Administered 2020-12-20 (×3): 8 [IU] via SUBCUTANEOUS
  Administered 2020-12-20: 3 [IU] via SUBCUTANEOUS
  Administered 2020-12-21: 11 [IU] via SUBCUTANEOUS
  Administered 2020-12-21: 2 [IU] via SUBCUTANEOUS
  Administered 2020-12-21: 8 [IU] via SUBCUTANEOUS
  Administered 2020-12-21: 3 [IU] via SUBCUTANEOUS
  Administered 2020-12-21: 11 [IU] via SUBCUTANEOUS
  Administered 2020-12-22: 2 [IU] via SUBCUTANEOUS
  Administered 2020-12-23: 3 [IU] via SUBCUTANEOUS
  Administered 2020-12-23 – 2020-12-26 (×7): 2 [IU] via SUBCUTANEOUS
  Administered 2020-12-27: 3 [IU] via SUBCUTANEOUS
  Administered 2020-12-27: 2 [IU] via SUBCUTANEOUS
  Administered 2020-12-27 (×2): 3 [IU] via SUBCUTANEOUS
  Administered 2020-12-28 (×2): 2 [IU] via SUBCUTANEOUS
  Administered 2020-12-28 (×2): 3 [IU] via SUBCUTANEOUS
  Administered 2020-12-28: 2 [IU] via SUBCUTANEOUS
  Administered 2020-12-28: 3 [IU] via SUBCUTANEOUS
  Administered 2020-12-30 – 2021-01-01 (×5): 2 [IU] via SUBCUTANEOUS
  Administered 2021-01-01: 3 [IU] via SUBCUTANEOUS
  Administered 2021-01-02 (×2): 2 [IU] via SUBCUTANEOUS
  Administered 2021-01-02: 3 [IU] via SUBCUTANEOUS
  Administered 2021-01-02 – 2021-01-03 (×2): 2 [IU] via SUBCUTANEOUS
  Administered 2021-01-03: 3 [IU] via SUBCUTANEOUS
  Administered 2021-01-04 – 2021-01-09 (×14): 2 [IU] via SUBCUTANEOUS
  Administered 2021-01-09: 3 [IU] via SUBCUTANEOUS
  Administered 2021-01-10: 2 [IU] via SUBCUTANEOUS
  Administered 2021-01-10 (×2): 3 [IU] via SUBCUTANEOUS
  Administered 2021-01-10: 2 [IU] via SUBCUTANEOUS
  Administered 2021-01-10 – 2021-01-11 (×4): 3 [IU] via SUBCUTANEOUS
  Administered 2021-01-11: 2 [IU] via SUBCUTANEOUS
  Administered 2021-01-11 (×2): 3 [IU] via SUBCUTANEOUS
  Administered 2021-01-12 (×2): 2 [IU] via SUBCUTANEOUS
  Administered 2021-01-12 (×2): 3 [IU] via SUBCUTANEOUS
  Administered 2021-01-12: 2 [IU] via SUBCUTANEOUS
  Administered 2021-01-13: 3 [IU] via SUBCUTANEOUS
  Administered 2021-01-13: 2 [IU] via SUBCUTANEOUS
  Administered 2021-01-13 (×3): 3 [IU] via SUBCUTANEOUS
  Administered 2021-01-14: 2 [IU] via SUBCUTANEOUS
  Administered 2021-01-14: 3 [IU] via SUBCUTANEOUS
  Administered 2021-01-14: 2 [IU] via SUBCUTANEOUS
  Administered 2021-01-14 (×3): 3 [IU] via SUBCUTANEOUS
  Administered 2021-01-15: 2 [IU] via SUBCUTANEOUS
  Administered 2021-01-15 (×5): 3 [IU] via SUBCUTANEOUS
  Administered 2021-01-16: 2 [IU] via SUBCUTANEOUS
  Administered 2021-01-16 (×2): 3 [IU] via SUBCUTANEOUS
  Administered 2021-01-16: 5 [IU] via SUBCUTANEOUS
  Administered 2021-01-16 – 2021-01-17 (×3): 3 [IU] via SUBCUTANEOUS
  Administered 2021-01-17: 2 [IU] via SUBCUTANEOUS
  Administered 2021-01-17: 3 [IU] via SUBCUTANEOUS
  Administered 2021-01-17: 2 [IU] via SUBCUTANEOUS
  Administered 2021-01-18 (×4): 3 [IU] via SUBCUTANEOUS
  Administered 2021-01-18: 5 [IU] via SUBCUTANEOUS
  Administered 2021-01-18 – 2021-01-19 (×3): 3 [IU] via SUBCUTANEOUS
  Administered 2021-01-19: 6 [IU] via SUBCUTANEOUS
  Administered 2021-01-19 (×2): 3 [IU] via SUBCUTANEOUS
  Administered 2021-01-19 – 2021-01-20 (×2): 5 [IU] via SUBCUTANEOUS
  Administered 2021-01-20 – 2021-01-21 (×7): 3 [IU] via SUBCUTANEOUS
  Administered 2021-01-21: 5 [IU] via SUBCUTANEOUS
  Administered 2021-01-21 (×2): 3 [IU] via SUBCUTANEOUS
  Administered 2021-01-22: 5 [IU] via SUBCUTANEOUS
  Administered 2021-01-22 (×2): 3 [IU] via SUBCUTANEOUS
  Administered 2021-01-22: 5 [IU] via SUBCUTANEOUS
  Administered 2021-01-22 (×2): 3 [IU] via SUBCUTANEOUS
  Administered 2021-01-23: 2 [IU] via SUBCUTANEOUS
  Administered 2021-01-23 – 2021-01-24 (×10): 3 [IU] via SUBCUTANEOUS
  Administered 2021-01-25: 2 [IU] via SUBCUTANEOUS
  Administered 2021-01-25 – 2021-01-26 (×2): 3 [IU] via SUBCUTANEOUS
  Administered 2021-01-26: 2 [IU] via SUBCUTANEOUS
  Administered 2021-01-26: 5 [IU] via SUBCUTANEOUS
  Administered 2021-01-26 (×2): 3 [IU] via SUBCUTANEOUS
  Administered 2021-01-26: 2 [IU] via SUBCUTANEOUS
  Administered 2021-01-27 (×7): 3 [IU] via SUBCUTANEOUS
  Administered 2021-01-28: 2 [IU] via SUBCUTANEOUS
  Administered 2021-01-28: 3 [IU] via SUBCUTANEOUS
  Administered 2021-01-28: 2 [IU] via SUBCUTANEOUS
  Administered 2021-01-28 – 2021-01-29 (×6): 3 [IU] via SUBCUTANEOUS
  Administered 2021-01-30 (×2): 2 [IU] via SUBCUTANEOUS
  Administered 2021-01-31: 5 [IU] via SUBCUTANEOUS
  Administered 2021-01-31: 3 [IU] via SUBCUTANEOUS
  Administered 2021-01-31: 2 [IU] via SUBCUTANEOUS
  Administered 2021-01-31: 3 [IU] via SUBCUTANEOUS
  Administered 2021-01-31: 2 [IU] via SUBCUTANEOUS
  Administered 2021-02-01 (×5): 3 [IU] via SUBCUTANEOUS
  Administered 2021-02-02 (×2): 2 [IU] via SUBCUTANEOUS
  Administered 2021-02-02: 3 [IU] via SUBCUTANEOUS
  Administered 2021-02-02 – 2021-02-03 (×6): 2 [IU] via SUBCUTANEOUS
  Administered 2021-02-03 – 2021-02-04 (×2): 3 [IU] via SUBCUTANEOUS
  Administered 2021-02-04: 2 [IU] via SUBCUTANEOUS
  Administered 2021-02-04: 3 [IU] via SUBCUTANEOUS
  Administered 2021-02-04 (×3): 2 [IU] via SUBCUTANEOUS
  Administered 2021-02-05 (×3): 3 [IU] via SUBCUTANEOUS
  Administered 2021-02-05 (×2): 2 [IU] via SUBCUTANEOUS
  Administered 2021-02-05 (×2): 3 [IU] via SUBCUTANEOUS
  Administered 2021-02-06 – 2021-02-07 (×4): 2 [IU] via SUBCUTANEOUS
  Administered 2021-02-07 – 2021-02-08 (×4): 3 [IU] via SUBCUTANEOUS
  Administered 2021-02-08: 5 [IU] via SUBCUTANEOUS
  Administered 2021-02-09: 2 [IU] via SUBCUTANEOUS
  Administered 2021-02-09: 3 [IU] via SUBCUTANEOUS
  Administered 2021-02-09 – 2021-02-10 (×4): 2 [IU] via SUBCUTANEOUS
  Administered 2021-02-10: 3 [IU] via SUBCUTANEOUS
  Administered 2021-02-11 (×4): 2 [IU] via SUBCUTANEOUS
  Administered 2021-02-12 (×4): 3 [IU] via SUBCUTANEOUS
  Administered 2021-02-12: 2 [IU] via SUBCUTANEOUS
  Administered 2021-02-12: 3 [IU] via SUBCUTANEOUS
  Administered 2021-02-13: 2 [IU] via SUBCUTANEOUS
  Administered 2021-02-13: 3 [IU] via SUBCUTANEOUS
  Administered 2021-02-13 – 2021-02-14 (×3): 2 [IU] via SUBCUTANEOUS
  Administered 2021-02-14: 3 [IU] via SUBCUTANEOUS
  Administered 2021-02-14: 5 [IU] via SUBCUTANEOUS
  Administered 2021-02-14 (×2): 3 [IU] via SUBCUTANEOUS
  Administered 2021-02-14: 2 [IU] via SUBCUTANEOUS
  Administered 2021-02-14 – 2021-02-15 (×6): 3 [IU] via SUBCUTANEOUS
  Administered 2021-02-16: 5 [IU] via SUBCUTANEOUS
  Administered 2021-02-16: 2 [IU] via SUBCUTANEOUS
  Administered 2021-02-16: 3 [IU] via SUBCUTANEOUS
  Administered 2021-02-16: 2 [IU] via SUBCUTANEOUS
  Administered 2021-02-17 (×3): 3 [IU] via SUBCUTANEOUS
  Administered 2021-02-17: 2 [IU] via SUBCUTANEOUS
  Administered 2021-02-17 – 2021-02-18 (×3): 3 [IU] via SUBCUTANEOUS
  Administered 2021-02-18 (×2): 2 [IU] via SUBCUTANEOUS
  Administered 2021-02-18: 3 [IU] via SUBCUTANEOUS
  Administered 2021-02-18: 4 [IU] via SUBCUTANEOUS
  Administered 2021-02-19: 2 [IU] via SUBCUTANEOUS
  Administered 2021-02-19 (×2): 3 [IU] via SUBCUTANEOUS
  Administered 2021-02-19 – 2021-02-20 (×3): 2 [IU] via SUBCUTANEOUS
  Administered 2021-02-20: 3 [IU] via SUBCUTANEOUS
  Administered 2021-02-20: 2 [IU] via SUBCUTANEOUS
  Administered 2021-02-20: 3 [IU] via SUBCUTANEOUS
  Administered 2021-02-20 – 2021-02-21 (×2): 2 [IU] via SUBCUTANEOUS
  Administered 2021-02-21: 3 [IU] via SUBCUTANEOUS
  Administered 2021-02-21: 2 [IU] via SUBCUTANEOUS
  Administered 2021-02-21 – 2021-02-22 (×3): 3 [IU] via SUBCUTANEOUS
  Administered 2021-02-22 (×3): 2 [IU] via SUBCUTANEOUS
  Administered 2021-02-22: 5 [IU] via SUBCUTANEOUS
  Administered 2021-02-23 (×2): 3 [IU] via SUBCUTANEOUS
  Administered 2021-02-23 – 2021-02-24 (×4): 2 [IU] via SUBCUTANEOUS
  Administered 2021-02-24 (×2): 3 [IU] via SUBCUTANEOUS
  Administered 2021-02-25 (×2): 2 [IU] via SUBCUTANEOUS
  Administered 2021-02-26: 3 [IU] via SUBCUTANEOUS
  Administered 2021-02-26: 1 [IU] via SUBCUTANEOUS
  Administered 2021-02-27: 3 [IU] via SUBCUTANEOUS
  Administered 2021-02-27: 2 [IU] via SUBCUTANEOUS
  Administered 2021-02-27 (×2): 3 [IU] via SUBCUTANEOUS
  Administered 2021-02-28: 2 [IU] via SUBCUTANEOUS
  Administered 2021-02-28: 3 [IU] via SUBCUTANEOUS
  Administered 2021-02-28 – 2021-03-01 (×3): 2 [IU] via SUBCUTANEOUS
  Administered 2021-03-01: 3 [IU] via SUBCUTANEOUS
  Administered 2021-03-01 – 2021-03-04 (×10): 2 [IU] via SUBCUTANEOUS
  Administered 2021-03-04: 3 [IU] via SUBCUTANEOUS
  Administered 2021-03-04 – 2021-03-06 (×8): 2 [IU] via SUBCUTANEOUS
  Administered 2021-03-06: 3 [IU] via SUBCUTANEOUS
  Administered 2021-03-06 – 2021-03-11 (×16): 2 [IU] via SUBCUTANEOUS

## 2020-12-12 MED ORDER — ARGATROBAN 50 MG/50ML IV SOLN
0.4000 ug/kg/min | INTRAVENOUS | Status: DC
  Administered 2020-12-12: 0.25 ug/kg/min via INTRAVENOUS
  Administered 2020-12-13: 0.4 ug/kg/min via INTRAVENOUS
  Filled 2020-12-12 (×2): qty 50

## 2020-12-12 MED ORDER — POLYETHYLENE GLYCOL 3350 17 G PO PACK
17.0000 g | PACK | Freq: Every day | ORAL | Status: DC
  Administered 2020-12-20 – 2020-12-21 (×2): 17 g
  Filled 2020-12-12 (×2): qty 1

## 2020-12-12 MED ORDER — MIDAZOLAM HCL 2 MG/2ML IJ SOLN: 1.0000 mg | INTRAMUSCULAR | Status: DC | PRN

## 2020-12-12 MED ORDER — FENTANYL CITRATE (PF) 100 MCG/2ML IJ SOLN: 50.0000 ug | INTRAMUSCULAR | Status: DC | PRN

## 2020-12-12 NOTE — Progress Notes (Signed)
Mr. Bellanca is intubated.  He is arousable.  However, he is somewhat agitated.  He is on CRRT.  His blood count is still trending downward.  It is not going down all that much.  From what the nurse tells me, there is no systemic heparin running in the patient.  He has heparin running in the machine.  I have to believe that the platelet count trending downward is reflective of his overall health issues.  Hopefully, he will be able to pull out all of this.  Today, his lab work shows a white cell count at 13.6.  Hemoglobin 8.9.  Platelet count 90,000.  His BUN is 20 creatinine 1.38.  His albumin is quite low at 1.7.  I would hold off on running a HITT panel on him right now.  All of his vital signs are stable.  Temperature is 98.7.  Pulse is 71.  Blood pressure 101/83.  His lungs sound relatively clear bilaterally.  Cardiac exam regular rate and rhythm.  Heart sounds are little bit distant.  Abdomen is soft.  There is no fluid wave.  Extremity shows the healing PPE.  Again, we will have to watch the platelets.  If they continue to trend downward, then it would be reasonable to check him for HITT.  I know that he is getting wonderful care from all of the ICU staff.  I realize this is quite complicated.  Thankfully, I do still think cancer is really a problem for him right now.  I believe his immune system should be decent.    Lattie Haw, MD  Darlyn Chamber 29:11

## 2020-12-12 NOTE — Progress Notes (Addendum)
Patient ID: Thomas [name], male   DOB: [birth year], 65 y.o.   MRN: [id number] S: No events overnight and able to UF 2 liters.  Remains off of pressors.  Pt was seen and examined while on CRRT and orders reviewed.  O:BP 138/68   Pulse 73   Temp 99.7 F (37.6 C) (Axillary)   Resp (!) 26   Ht 5' 8"  (1.727 m)   Wt 112.6 kg   SpO2 100%   BMI 37.74 kg/m   Intake/Output Summary (Last 24 hours) at 12/12/2020 1227 Last data filed at 12/12/2020 1200 Gross per 24 hour  Intake 2559.28 ml  Output 4595.1 ml  Net -2035.82 ml   Intake/Output: I/O last 3 completed shifts: In: 4156.8 [I.V.:1494.6; Other:140; NG/GT:696; IV Piggyback:1826.2] Out: 6015.9 [Other:4942.9; QMGQQ:7619]  Intake/Output this shift:  Total I/O In: 608.4 [I.V.:184.7; Other:10; NG/GT:150; IV Piggyback:263.7] Out: 1010 [Other:930; Stool:80] Weight change: -3.9 kg JKD:TOIZTIWPY and sedated CVS:RRR Resp:scattered rhonchi bilaterally Abd: obese +BS, soft Ext:2+ anasarca to lower abdomen and sacral region as well as hands  Recent Labs  Lab 12/08/20 1521 12/09/20 0416 12/09/20 0916 12/09/20 1555 12/10/20 0412 12/10/20 1714 12/11/20 0500 12/11/20 1553 12/12/20 0432  NA 139 136  --  135 138 137 138 137 137  K 4.8 4.5  --  4.1 4.1 3.9 3.8 4.1 3.9  CL 108 106  --  103 106 103 105 107 106  CO2 19* 20*  --  19* 20* 20* 21* 21* 21*  GLUCOSE 114* 129*  --  130* 131* 153* 174* 200* 191*  BUN 37* 31*  --  25* 21 19 20 20 20   CREATININE 2.45* 2.06*  --  1.90* 1.76* 1.56* 1.58* 1.54* 1.38*  ALBUMIN 1.8*  1.8* 1.8* 1.7* 1.7* 1.6* 1.7* 1.7* 1.8* 1.7*  CALCIUM 7.2* 7.0*  --  6.9* 7.2* 7.2* 7.3* 7.0* 7.2*  PHOS 3.1 2.6  --  2.2* 1.9* 3.2 2.0* 3.2 1.8*  AST 43*  --  46*  --   --   --   --   --   --   ALT 23  --  23  --   --   --   --   --   --    Liver Function Tests: Recent Labs  Lab 12/08/20 1521 12/09/20 0416 12/09/20 0916 12/09/20 1555 12/11/20 0500 12/11/20 1553 12/12/20 0432  AST 43*  --  46*  --   --   --   --   ALT  23  --  23  --   --   --   --   ALKPHOS 61  --  74  --   --   --   --   BILITOT 2.1*  --  2.0*  --   --   --   --   PROT 5.4*  --  5.4*  --   --   --   --   ALBUMIN 1.8*  1.8*   < > 1.7*   < > 1.7* 1.8* 1.7*   < > = values in this interval not displayed.   No results for input(s): LIPASE, AMYLASE in the last 168 hours. No results for input(s): AMMONIA in the last 168 hours. CBC: Recent Labs  Lab 12/06/20 0437 12/06/20 2119 12/07/20 0932 12/08/20 0730 12/09/20 0416 12/10/20 0412 12/11/20 0500 12/12/20 0432  WBC 4.7 6.1 7.5  --  17.0* 14.9* 14.4* 13.6*  NEUTROABS 2.0 3.9  --   --   --   --   --  10.1*  HGB 8.6* 8.8* 7.6*   < > 10.1* 9.3* 9.1* 8.9*  HCT 25.2* 25.6* 21.5*   < > 28.6* 26.3* 26.4* 26.2*  MCV 78.8* 76.9* 74.7*  --  76.7* 77.4* 79.0* 81.1  PLT 245 295 242  --  161 138* 103* 90*   < > = values in this interval not displayed.   Cardiac Enzymes: No results for input(s): CKTOTAL, CKMB, CKMBINDEX, TROPONINI in the last 168 hours. CBG: Recent Labs  Lab 12/11/20 1949 12/12/20 0042 12/12/20 0429 12/12/20 0809 12/12/20 1120  GLUCAP 203* 210* 188* 194* 205*    Iron Studies: No results for input(s): IRON, TIBC, TRANSFERRIN, FERRITIN in the last 72 hours. Studies/Results: DG CHEST PORT 1 VIEW  Result Date: 12/12/2020 CLINICAL DATA:  Endotracheal tube position EXAM: PORTABLE CHEST 1 VIEW COMPARISON:  Chest radiograph 12/09/2020 FINDINGS: The endotracheal tube tip is in the midthoracic trachea. A right chest wall port is unchanged. The enteric catheter tip is in the stomach. The cardiomediastinal silhouette is stable. There is no focal consolidation or pulmonary edema. There is no pleural effusion or pneumothorax. The bones are stable. IMPRESSION: Endotracheal tube tip in the midthoracic trachea. Electronically Signed   By: Valetta Mole MD   On: 12/12/2020 08:09    albuterol  2.5 mg Nebulization QID   chlorhexidine gluconate (MEDLINE KIT)  15 mL Mouth Rinse BID    Chlorhexidine Gluconate Cloth  6 each Topical Daily   clonazepam  0.5 mg Per Tube BID   feeding supplement (PROSource TF)  45 mL Per Tube TID   feeding supplement (VITAL HIGH PROTEIN)  1,000 mL Per Tube Q24H   hydrocerin   Topical BID   hydrocortisone sod succinate (SOLU-CORTEF) inj  100 mg Intravenous Q8H   insulin aspart  1-3 Units Subcutaneous Q4H   mouth rinse  15 mL Mouth Rinse 10 times per day   OLANZapine  5 mg Per Tube QHS    BMET    Component Value Date/Time   NA 137 12/12/2020 0432   K 3.9 12/12/2020 0432   CL 106 12/12/2020 0432   CO2 21 (L) 12/12/2020 0432   GLUCOSE 191 (H) 12/12/2020 0432   BUN 20 12/12/2020 0432   CREATININE 1.38 (H) 12/12/2020 0432   CREATININE 1.83 (H) 11/19/2020 0914   CALCIUM 7.2 (L) 12/12/2020 0432   GFRNONAA 57 (L) 12/12/2020 0432   GFRNONAA 41 (L) 11/19/2020 0914   GFRAA >60 04/01/2017 1921   CBC    Component Value Date/Time   WBC 13.6 (H) 12/12/2020 0432   RBC 3.23 (L) 12/12/2020 0432   HGB 8.9 (L) 12/12/2020 0432   HGB 11.2 (L) 11/19/2020 0914   HCT 26.2 (L) 12/12/2020 0432   PLT 90 (L) 12/12/2020 0432   PLT 212 11/19/2020 0914   MCV 81.1 12/12/2020 0432   MCH 27.6 12/12/2020 0432   MCHC 34.0 12/12/2020 0432   RDW 24.3 (H) 12/12/2020 0432   LYMPHSABS 0.9 12/12/2020 0432   MONOABS 0.9 12/12/2020 0432   EOSABS 0.0 12/12/2020 0432   BASOSABS 0.1 12/12/2020 0432     Assessment/Plan:  AKI/CKD stage IIIb - presumably ischemic ATN in setting of severe sepsis and MSOF and remains oliguric.  CRRT initiated 7/31.  Running well and UF goal 50-100 ml/hr.  Off pressors and able to UF 1.2 liters overnight but remains markedly volume overloaded. CRRT orders:  4K/2.5 pre-filter at 400 ml/hr, 4K/2.5 post filter at 200 ml/hr, 4K/2.5Ca Dialysate at 1800 ml/hr Uf goal 50-100 ml/hr  Heparin anticoagulation in circuit.  Left femoral HD catheter placed 12/07/20 Anasarca - still with marked volume overload as he had to run positive due to  hypotension earlier on.  Goal uf 50-100 ml/hr and would use pressors if needed given anasarca. Given significant volume overload and large IVF requirements would continue with CRRT for now and once extubated may consider transition to IHD/transfer to [facility]. Acute hypoxic respiratory failure/VDRF - aspiration pneumonia.  On maxepime/vanc per PCCM.  Started on precedex gtt per PCCM  Thrombocytopenia - presumably due to critical illness.  Hematology following and HIT panel pending.  Currently getting heparin within the CRRT circuit. Metabolic acidosis - nongap.  Currently on CRRT Atrial fibrillation -on amiodarone and rate controlled.  Anemia of critical illness- transfuse prn. Acute metabolic encephalopathy - on fent gtt, zygpreza, and changing to precedex gtt. Hypophosphatemia - will replete IV and follow.   Donetta Potts, MD [employer] [phone]

## 2020-12-12 NOTE — Progress Notes (Signed)
NAME:  Thomas [name], MRN:  [id number], DOB:  1956-01-02, LOS: 80 ADMISSION DATE:  11/30/2020, CONSULTATION DATE:  7/30 REFERRING MD:  Riccardo Dubin, CHIEF COMPLAINT:  Worsening hypoxia/dyspnea  History of Present Illness:  65 yo M admitted for worsening sepsis vs reaction to chemotherapy with high ostomy output and soft BP on 7/25. Has had issues during his hospitalization with recurrent aspirations and more recently worsening respiratory status.  Most recently pt developed worsening hypoxia and was requiring BIPAP w/ FIO2 of 100% and increased work of breathing. Unable to obtain ABG, CXR in AM of 7/30 showed some worsening concern of aspiration.   Who was admitted to intensive care and subsequently had worsening clinical status requiring intubation, CRRT, multiple pressors and pneumonia along with CT findings 8/1 concerning for ischemic bowel.  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  Hx of remote MCA stroke Asthma hx as a child Epilepsy on keppra GERD T2DM on metformin as outpt HTN HLD OSA  Significant Hospital Events:  7/27 Transferred to [facility], stopped abx as sepsis resolved/ruled out 7/30 AM Worsened AME, more hypoxic with worsening renal funciton, new aspiration on abx restarted 7/30 PM Pt with increased o2 requirement on 100% FIO2 on BIPAP, d/w family who wanted to proceed with intubation, pt intubated 8/1 Some issues with tachycardia and hypotension overnight. Reproted high ostomy output  8/2 down to 9 mcg of Levophed, however repeat CT abdomen/pelvis showed inflammatory wall thickening and fat stranding of the descending and sigmoid colon, new since prior, consistent with nonspecific infectious, inflammatory, or ischemic colitis.  Also new dense consolidative airspace opacity in the right lower and right middle lobes 8/3  further weaning NE. Weaning sedation. Full code, full scope per family meeting 8/2  8/4 off pressors, plt down to 103.  8/5 remains off pressors. Moving around more, not really following commands though   Micro: Resp viral panel 7/24 > neg covid/ flu [facility]  7/24  x 2 neg  MRSA  7/25 neg  UC   7/25  neg  C diff screeen 7/26  neg ET culture 7/31 > moderate yeast> GI panel  8/1> neg   Abx: Unasyn 7/30 >>7/31 Maxepime 7/31 >> Flagyl 7/31 >> Vancomycin 8/1>> Anidulafungin 8/1>>8/3  Interim History / Subjective:   Fent gtt up to 175 unclear why Plt down to 90  Stayed in sinus after dc amio  Remains on CRRT Still off pressors  Objective   Blood pressure (!) 112/55, pulse 76, temperature 98.5 F (36.9 C), temperature source Axillary, resp. rate (!) 26, height 5\' 8"  (1.727 m), weight 112.6 kg, SpO2 99 %.    Vent Mode: PRVC FiO2 (%):  [40 %-50 %] 40 % Set Rate:  [26 bmp] 26 bmp Vt Set:  [600 mL] 600 mL PEEP:  [8 cmH20] 8 cmH20 Plateau Pressure:  [21 cmH20-23 cmH20] 23 cmH20   Intake/Output Summary (Last 24 hours) at 12/12/2020 0950 Last data filed at 12/12/2020 0900 Gross per 24 hour  Intake 2881.61 ml  Output 4520.1 ml  Net -1638.49 ml   Filed Weights   12/10/20 0416 12/11/20 0500 12/12/20 0500  Weight: 118.1 kg 116.5 kg 112.6 kg   General: Chronically and Critically ill appearing adult M intubated sedated NAD  HEENT:   NCAT pink mm anicteric sclera ETT secure  Neuro: Sedated. Moving BUE BLE spontaneously. Opens eyes a little  to command vs stimulation. Does not follow commands otherwise  CV: rrr s1s2 cap refill brisk  PULM: Diminished bibasilar sounds. Improving rhonchi. Mechanically ventilated  GI:  R ostomy. Soft round abdomen. Decreased bowel sounds  Extremities: BUE BLE pitting edema. No acute joint deformity no cyanosis or clubbing  Skin: Palmar surface of hands with dry cracked skin and dry sloughing skin with underlying beefy red dry skin. Feet are dry with some  cracking and hyperpigmentation on palmar surface.  Resolved Hospital Problem list    Shock  HHS Afib RVR  Assessment & Plan:   Acute metabolic encephalopathy Hx seizure disorder Hx L CVA (2015) P: -Keppra -sz precautions -RASS goal is 0, he has been maintained deeper than this but has had pretty significant decr in sedation the past few days -with improved hemodynamics, adding dexmed 8/5 with plan to see if we can furhter wean fent   Acute hypoxic respiratory failure Aspiration PNA -candida tropicalis on trach aspirate but not felt to be pathogenic Diaphragmatic spasm P: -VAP, pulm hygiene -sedation changes as above.  On pretty minimal vent settings. If we can a little more awake, start WUA SBT aggressively (can start SBT even if not following commands to see pulm mechanics)  -if unable to extubate early next week likely trach  [facility] clonazepam for spasm, PRN robaxin   Severe sepsis due to aspiration PNA and colitis - shock improved  -recurrent aspiration. CT a/p with inflammatory changes c/w colitis  P: -cont broad abx flagyl cefepime  -adv EN, appreciate RDN help  -serial abdominal exam, monitor ostomy outpt -if NIBP and art line correlate well will dc art line 8/5   AKI on CKD 3a requiring CRRT Hypophosphatemia  -likely ATN with shock -foley dc after CRRT started given minimal urine production -Cr starting to downtrend  P: -CRRT per nephro.  -bladder scan, I/O as needed -Hemodynamically, is stable for iHD but not available at [facility]. For now, think keeping CRRT with -UF is really helpful for his volume status, and will defer consideration for transfer to iHD vs trail off RRT to see if there is any renal recovery to nephro   NAGMA Lactic acidosis -- both improving -LA started to decr 8/2 and clinical picture was improving, as such pretty low yield to cont trending LA P -tx underlying problems as above   Stg IIIb transverse colon cancer ([id number]) S/p partial transverse  colectomy, end colectomy On CAPOX (s/p 3 cycles 6/1, 6/22. 7/13) -goal curative  P: -Follows with Dr. Ammie Dalton   HFpEF -Echocardiogram diastolic dysfunction and EF 60 to 65% P: -cardiac monitoring   Thrombocytopenia  -likely this is chemo and critical illness related P: -cont daily CBC -agree w heme onc insight re deferring HITT panel right now   Hx HTN Hx HLD  -Home medication includes: Norvasc, Lipitor,  P: -holding home meds  DM2 with hyperglycemia HHS - improved  -Home medications include Metformin  P: -SSI  Obesity, Class II Protein calorie malnutrition, moderate  P: -EN per RDN   Stage 2 coccyx pressure injury present on admission  P: -turn, pressure alleviation, wound care  Capecitabine induced desquamation: hand/foot syndrome  -superficial dry sloughing, areas of cracking, with underlying dry beefy erythematous discoloration  P: -moisturizer to hands feet scrotum   Goals of care  P: -interdisciplinary GOC mtg 8/2 -- family wishes for full code full scope of offered interventions -cont daily updates    Best Practice  Diet/type: NPO DVT prophylaxis: SCD GI prophylaxis: H2B  Lines: Port, femoral trialysis, art line (possible dc art line 8/5 depending on NIBP correlation) Foley:  N/A Code Status:  full code Last date of goals of care discussion : 8/2 : PCCM Palli, RN, family -- full code full scope   CRITICAL CARE Performed by: Cristal Generous   Total critical care time: 37 minutes  Critical care time was exclusive of separately billable procedures and treating other patients. Critical care was necessary to treat or prevent imminent or life-threatening deterioration.  Critical care was time spent personally by me on the following activities: development of treatment plan with patient and/or surrogate as well as nursing, discussions with consultants, evaluation of patient's response to treatment, examination of patient, obtaining history from patient  or surrogate, ordering and performing treatments and interventions, ordering and review of laboratory studies, ordering and review of radiographic studies, pulse oximetry and re-evaluation of patient's condition.  Eliseo Gum MSN, AGACNP-BC [place] for pager  12/12/2020, 9:50 AM

## 2020-12-12 NOTE — Progress Notes (Addendum)
PCCM interval prog note  65 yo M AKI on CKD requiring CRRT, Hypoxic respiratory failure on MV with improving sepsis due to PNA, metabolic encephalopathy.  Worse thrombocytopenia over the past couple days but plt still 90, heparin through crrt.   Earlier today we added dexmed and he has been weaned off fent.  HIT antibody was sent and BLE dopplers were ordered.   In follow up, dopplers with RLE DVT    Critically ill appearing intubated sedated on CRRT NCAT ETT secure MV, symmetrical chest expansion Rrr cap refill brisk Skin with dry sloughing on palms exposing dry red skin GU WNL  Soft round abd with ostomy Moving BUE BLE spontaneously, opening eyes to stimulation PERRL 71mm sluggish    Acute DVT Thrombocytopenia, r/o HITT Metabolic encephalopathy Acute respiratory failure with hypoxia   Hyperglycemia P -argatroban per pharm consult -dc heparin in crrt circuit  -SRA -changing dexmed range from 0 to 0.8 -dc fent gtt, adding PRN  -RASS goal 0  -WUA/SBT starting tomorrow 8/6 AM  -changing sSSI to moderateSSI (EN being advanced, on steroids). Depending on response may need to incr coverage further   Additional critical care time : 34 minutes   Eliseo Gum MSN, AGACNP-BC [place] for pager  12/12/2020, 3:40 PM

## 2020-12-12 NOTE — Progress Notes (Signed)
Inpatient Diabetes Program Recommendations  AACE/ADA: New Consensus Statement on Inpatient Glycemic Control (2015)  Target Ranges:  Prepandial:   less than 140 mg/dL      Peak postprandial:   less than 180 mg/dL (1-2 hours)      Critically ill patients:  140 - 180 mg/dL   Lab Results  Component Value Date   GLUCAP 205 (H) 12/12/2020   HGBA1C 8.7 (H) 12/01/2020    Review of Glycemic Control Results for IMRAAN, WENDELL (MRN [id number]) as of 12/12/2020 11:24  Ref. Range 12/11/2020 12:10 12/11/2020 15:51 12/11/2020 19:49 12/12/2020 00:42 12/12/2020 04:29 12/12/2020 08:09 12/12/2020 11:20  Glucose-Capillary Latest Ref Range: 70 - 99 mg/dL 166 (H) 203 (H) 203 (H) 210 (H) 188 (H) 194 (H) 205 (H)   Diabetes history: DM 2 Outpatient Diabetes medications: Metformin 500 mg bid Current orders for Inpatient glycemic control:  Novolog 1-3 units Q4 hours  Vital HP 60 ml/hour  Inpatient Diabetes Program Recommendations:    Glucose trends above inpatient goal. Pt on tube feeds  -  Consider adding Novolog 2 units Q4 hours Tube Feed coverage  Thanks,  Tama Headings RN, MSN, BC-ADM Inpatient Diabetes Coordinator Team Pager [phone] (8a-5p)

## 2020-12-12 NOTE — TOC Progression Note (Signed)
Transition of Care [facility]) - Progression Note    Patient Details  Name: Thomas [name] MRN: [id number] Date of Birth: 08/17/1955  [facility] CM/SW Contact  Leeroy Cha, RN Phone Number: 12/12/2020, 9:15 AM  Clinical Narrative:    CHART NOTE: [name] is intubated.  He is arousable.  However, he is somewhat agitated.  He is on CRRT.  His blood count is still trending downward.  It is not going down all that much.  From what the nurse tells me, there is no systemic heparin running in the patient.  He has heparin running in the machine.  I have to believe that the platelet count trending downward is reflective of his overall health issues.  Hopefully, he will be able to pull out all of this.  Today, his lab work shows a white cell count at 13.6.  Hemoglobin 8.9.  Platelet count 90,000.  His BUN is 20 creatinine 1.38.  His albumin is quite low at 1.7. TOC PLAN OF CARE: following for toc needs and progression,  Expected Discharge Plan: Home/Self Care Barriers to Discharge: Continued Medical Work up  Expected Discharge Plan and Services Expected Discharge Plan: Home/Self Care   Discharge Planning Services: CM Consult   Living arrangements for the past 2 months: Single Family Home                                       Social Determinants of Health (SDOH) Interventions    Readmission Risk Interventions No flowsheet data found.

## 2020-12-12 NOTE — Progress Notes (Signed)
NUTRITION NOTE  Patient last seen for full assessment by this RD yesterday.  Patient remains intubated with OGT in place and continues on CRRT.  No family or visitors present at this time.   He is receiving Vital High Protein @ 40 ml/hr with 45 ml Prosource TF TID and 30 ml free water every 4 hours. RN reports that TF was increased from 30 ml/hr to 40 ml/hr at ~1200 and that patient has continued to tolerate TF without issue.   RN reports that output from ostomy is liquid with chunky and that output is highly variable, often going several hours without output.   Discussed plan with RN to have Vital High Protein increase by 10 ml/hr every 8 hours, rather than every 12 hours, moving forward. Goal rate of 60 ml/hr.  RD will continue to follow per protocol.     Jarome Matin, MS, RD, LDN, CNSC Inpatient Clinical Dietitian RD pager # available in [place]  After hours/weekend pager # available in [facility]

## 2020-12-12 NOTE — Progress Notes (Signed)
BLE venous duplex has been completed.  Preliminary findings given to [facility], Therapist, sports.  Results can be found under chart review under CV PROC. 12/12/2020 2:49 PM Tyese Finken RVT, RDMS

## 2020-12-12 NOTE — Progress Notes (Signed)
[place] for Argartroban Indication: DVT, under investigation for HIIT  No Known Allergies  Patient Measurements: Height: 5' 8"  (172.7 cm) Weight: 112.6 kg (248 lb 3.8 oz) IBW/kg (Calculated) : 68.4 TBW 112.6 kg  Vital Signs: Temp: 99.8 F (37.7 C) (08/05 1621) Temp Source: Axillary (08/05 1621) BP: 147/72 (08/05 2130) Pulse Rate: 79 (08/05 2130)  Labs: Recent Labs    12/10/20 0412 12/10/20 1714 12/11/20 0500 12/11/20 1553 12/12/20 0432 12/12/20 1600 12/12/20 2019  HGB 9.3*  --  9.1*  --  8.9*  --   --   HCT 26.3*  --  26.4*  --  26.2*  --   --   PLT 138*  --  103*  --  90*  --   --   APTT 59*  --  45*  --  41*  --  47*  CREATININE 1.76*   < > 1.58* 1.54* 1.38* 1.46*  --    < > = values in this interval not displayed.    Estimated Creatinine Clearance: 62.2 mL/min (A) (by C-G formula based on SCr of 1.46 mg/dL (H)).  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   Dizziness    Dyspnea    pt states d/t weight   Epilepsy (HCC)    takes Keppra daily   GERD (gastroesophageal reflux disease)    takes Omeprazole daily   Headache    Hyperlipidemia    takes Atorvastatin daily   Hypertension    takes Lotrel daily   Peripheral edema    takes Lasix daily   Peripheral neuropathy    takes Gabapentin daily   Pneumonia 3 yrs ago   hx of   Seizures (HCC)    Sleep apnea    Urinary frequency    Medications:  Scheduled:   albuterol  2.5 mg Nebulization QID   chlorhexidine gluconate (MEDLINE KIT)  15 mL Mouth Rinse BID   Chlorhexidine Gluconate Cloth  6 each Topical Daily   clonazepam  0.5 mg Per Tube BID    docusate  100 mg Per Tube BID   feeding supplement (PROSource TF)  45 mL Per Tube TID   feeding supplement (VITAL HIGH PROTEIN)  1,000 mL Per Tube Q24H   hydrocerin   Topical BID   hydrocortisone sod succinate (SOLU-CORTEF) inj  100 mg Intravenous Q8H   insulin aspart  0-15 Units Subcutaneous Q4H   mouth rinse  15 mL Mouth Rinse 10 times per day   OLANZapine  5 mg Per Tube QHS   polyethylene glycol  17 g Per Tube Daily   Infusions:    prismasol BGK 4/2.5 400 mL/hr at 12/12/20 1030    prismasol BGK 4/2.5 200 mL/hr at 12/12/20 0040   sodium chloride 10 mL/hr at 12/12/20 2100   sodium chloride Stopped (12/12/20 1729)   anticoagulant sodium citrate     argatroban 0.25 mcg/kg/min (12/12/20 2100)   ceFEPime (MAXIPIME) IV Stopped (12/12/20 1222)   dexmedetomidine (PRECEDEX) IV infusion 0.6 mcg/kg/hr (12/12/20 2214)   famotidine (PEPCID) IV Stopped (12/12/20 0115)   levETIRAcetam Stopped (12/12/20  2131)   metronidazole 500 mg (12/12/20 2209)   prismasol BGK 4/2.5 1,800 mL/hr at 12/12/20 2023   promethazine (PHENERGAN) injection (IM or IVPB)     thiamine injection Stopped (12/12/20 2215)   Assessment: 49 yoM, intubated, on CRRT, with platelets decreasing. Onc doubts HITT, likely due to chemo/sepsis, but HIIT panel ordered,LE dopplers ordered > RLE DVT, need to use direct thrombin inhibitor - best choice Argatroban  12/12/2020: aPTT=47sec; slightly below goal range on Argatroban 0.63mg/kg/min No bleeding or infusion related issues per RN  Goal of Therapy:  aPTT 50-90 seconds Monitor platelets by anticoagulation protocol: Yes   Plan:  Argatroban - increase to 0.39m/kg/min, using 112.6 kg = 2.03 ml/hr Recheck aPTT in 4 hrs after rate change (goal is two aPTT levels in range, then can check aPTT q12-24h) Daily CBC  MiNetta CedarsharmD 12/12/2020,10:19 PM

## 2020-12-12 NOTE — Progress Notes (Signed)
[place] for Argartroban Indication: DVT, under investigation for HIIT  No Known Allergies  Patient Measurements: Height: _0  (172.7 cm) Weight: 112.6 kg (248 lb 3.8 oz) IBW/kg (Calculated) : 68.4 TBW 112.6 kg  Vital Signs: Temp: 99.7 F (37.6 C) (08/05 1130) Temp Source: Axillary (08/05 1130) BP: 142/65 (08/05 1300) Pulse Rate: 72 (08/05 1300)  Labs: Recent Labs    12/10/20 0412 12/10/20 1714 12/11/20 0500 12/11/20 1553 12/12/20 0432  HGB 9.3*  --  9.1*  --  8.9*  HCT 26.3*  --  26.4*  --  26.2*  PLT 138*  --  103*  --  90*  APTT 59*  --  45*  --  41*  CREATININE 1.76*   < > 1.58* 1.54* 1.38*   < > = values in this interval not displayed.   Estimated Creatinine Clearance: 65.9 mL/min (A) (by C-G formula based on SCr of 1.38 mg/dL (H)).  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   Dizziness    Dyspnea    pt states d/t weight   Epilepsy (HCC)    takes Keppra daily   GERD (gastroesophageal reflux disease)    takes Omeprazole daily   Headache    Hyperlipidemia    takes Atorvastatin daily   Hypertension    takes Lotrel daily   Peripheral edema    takes Lasix daily   Peripheral neuropathy    takes Gabapentin daily   Pneumonia 3 yrs ago   hx of   Seizures (HCC)    Sleep apnea    Urinary frequency    Medications:  Scheduled:   albuterol  2.5 mg Nebulization QID   chlorhexidine gluconate (MEDLINE KIT)  15 mL Mouth Rinse BID   Chlorhexidine Gluconate Cloth  6 each Topical Daily   clonazepam  0.5 mg Per Tube BID   docusate  100 mg Per Tube BID   feeding supplement (PROSource TF)  45 mL Per  Tube TID   feeding supplement (VITAL HIGH PROTEIN)  1,000 mL Per Tube Q24H   hydrocerin   Topical BID   hydrocortisone sod succinate (SOLU-CORTEF) inj  100 mg Intravenous Q8H   insulin aspart  0-15 Units Subcutaneous Q4H   mouth rinse  15 mL Mouth Rinse 10 times per day   OLANZapine  5 mg Per Tube QHS   polyethylene glycol  17 g Per Tube Daily   Infusions:    prismasol BGK 4/2.5 400 mL/hr at 12/12/20 1030    prismasol BGK 4/2.5 200 mL/hr at 12/12/20 0040   sodium chloride 10 mL/hr at 12/12/20 1600   sodium chloride 10 mL/hr at 12/12/20 1600   argatroban     ceFEPime (MAXIPIME) IV Stopped (12/12/20 1222)   dexmedetomidine (PRECEDEX) IV infusion 0.1 mcg/kg/hr (12/12/20 1600)   famotidine (PEPCID) IV Stopped (12/12/20 0115)   levETIRAcetam Stopped (12/12/20 1006)   metronidazole Stopped (12/12/20 1208)   prismasol BGK 4/2.5 1,800 mL/hr at 12/12/20 1143   promethazine (PHENERGAN) injection (IM or IVPB)     sodium phosphate  Dextrose 5% IVPB 42 mL/hr at  12/12/20 1600   thiamine injection Stopped (12/12/20 1033)   Assessment: 70 yoM, intubated, on CRRT, with platelets decreasing. Onc doubts HITT, likely due to chemo/sepsis, but HIIT panel ordered,LE dopplers ordered > RLE DVT, need to use direct thrombin inhibitor - best choice Argatroban  Goal of Therapy:  aPTT 50-90 seconds Monitor platelets by anticoagulation protocol: Yes   Plan:  Argatroban - begin at low end of range 0.68mg/kg/min, using 112.6 kg = 1.69 ml/hr Check aPTT q4 hr, goal is two aPTT levels in range, then can check aPTT q24 (or possibly q12 if needed)  GMinda DittoPharmD 12/12/2020,3:44 PM

## 2020-12-12 NOTE — Progress Notes (Signed)
CPT not done at this time. RN said pt just got settled and he was very agitated. We will resume CPT next time.

## 2020-12-13 DIAGNOSIS — E1100 Type 2 diabetes mellitus with hyperosmolarity without nonketotic hyperglycemic-hyperosmolar coma (NKHHC): Secondary | ICD-10-CM | POA: Diagnosis not present

## 2020-12-13 DIAGNOSIS — R509 Fever, unspecified: Secondary | ICD-10-CM | POA: Diagnosis not present

## 2020-12-13 DIAGNOSIS — N170 Acute kidney failure with tubular necrosis: Secondary | ICD-10-CM | POA: Diagnosis not present

## 2020-12-13 DIAGNOSIS — N182 Chronic kidney disease, stage 2 (mild): Secondary | ICD-10-CM | POA: Diagnosis not present

## 2020-12-13 DIAGNOSIS — J9601 Acute respiratory failure with hypoxia: Secondary | ICD-10-CM | POA: Diagnosis not present

## 2020-12-13 DIAGNOSIS — R109 Unspecified abdominal pain: Secondary | ICD-10-CM | POA: Diagnosis not present

## 2020-12-13 DIAGNOSIS — G9341 Metabolic encephalopathy: Secondary | ICD-10-CM | POA: Diagnosis not present

## 2020-12-13 DIAGNOSIS — A419 Sepsis, unspecified organism: Secondary | ICD-10-CM | POA: Diagnosis not present

## 2020-12-13 LAB — RENAL FUNCTION PANEL
Albumin: 1.9 g/dL — ABNORMAL LOW (ref 3.5–5.0)
Albumin: 1.9 g/dL — ABNORMAL LOW (ref 3.5–5.0)
Anion gap: 11 (ref 5–15)
Anion gap: 11 (ref 5–15)
BUN: 24 mg/dL — ABNORMAL HIGH (ref 8–23)
BUN: 28 mg/dL — ABNORMAL HIGH (ref 8–23)
CO2: 24 mmol/L (ref 22–32)
CO2: 23 mmol/L (ref 22–32)
Calcium: 7.2 mg/dL — ABNORMAL LOW (ref 8.9–10.3)
Calcium: 7.4 mg/dL — ABNORMAL LOW (ref 8.9–10.3)
Chloride: 101 mmol/L (ref 98–111)
Chloride: 103 mmol/L (ref 98–111)
Creatinine, Ser: 1.34 mg/dL — ABNORMAL HIGH (ref 0.61–1.24)
Creatinine, Ser: 1.54 mg/dL — ABNORMAL HIGH (ref 0.61–1.24)
GFR, Estimated: 59 mL/min — ABNORMAL LOW (ref >60)
GFR, Estimated: 50 mL/min — ABNORMAL LOW (ref >60)
Glucose, Bld: 235 mg/dL — ABNORMAL HIGH (ref 70–99)
Glucose, Bld: 329 mg/dL — ABNORMAL HIGH (ref 70–99)
Phosphorus: 1.5 mg/dL — ABNORMAL LOW (ref 2.5–4.6)
Phosphorus: 3.4 mg/dL (ref 2.5–4.6)
Potassium: 4 mmol/L (ref 3.5–5.1)
Potassium: 3.9 mmol/L (ref 3.5–5.1)
Sodium: 136 mmol/L (ref 135–145)
Sodium: 137 mmol/L (ref 135–145)

## 2020-12-13 LAB — HEPARIN INDUCED PLATELET AB (HIT ANTIBODY): Heparin Induced Plt Ab: 0.144 OD (ref 0.000–0.400)

## 2020-12-13 LAB — CBC
HCT: 29.6 % — ABNORMAL LOW (ref 39.0–52.0)
Hemoglobin: 9.6 g/dL — ABNORMAL LOW (ref 13.0–17.0)
MCH: 27.6 pg (ref 26.0–34.0)
MCHC: 32.4 g/dL (ref 30.0–36.0)
MCV: 85.1 fL (ref 80.0–100.0)
Platelets: 94 10*3/uL — ABNORMAL LOW (ref 150–400)
RBC: 3.48 MIL/uL — ABNORMAL LOW (ref 4.22–5.81)
RDW: 24.6 % — ABNORMAL HIGH (ref 11.5–15.5)
WBC: 13.7 10*3/uL — ABNORMAL HIGH (ref 4.0–10.5)
nRBC: 2.6 % — ABNORMAL HIGH (ref 0.0–0.2)

## 2020-12-13 LAB — APTT
aPTT: 45 seconds — ABNORMAL HIGH (ref 24–36)
aPTT: 140 seconds — ABNORMAL HIGH (ref 24–36)
aPTT: 41 seconds — ABNORMAL HIGH (ref 24–36)

## 2020-12-13 LAB — GLUCOSE, CAPILLARY
Glucose-Capillary: 158 mg/dL — ABNORMAL HIGH (ref 70–99)
Glucose-Capillary: 234 mg/dL — ABNORMAL HIGH (ref 70–99)
Glucose-Capillary: 257 mg/dL — ABNORMAL HIGH (ref 70–99)
Glucose-Capillary: 260 mg/dL — ABNORMAL HIGH (ref 70–99)
Glucose-Capillary: 310 mg/dL — ABNORMAL HIGH (ref 70–99)
Glucose-Capillary: 313 mg/dL — ABNORMAL HIGH (ref 70–99)

## 2020-12-13 LAB — MAGNESIUM: Magnesium: 2.5 mg/dL — ABNORMAL HIGH (ref 1.7–2.4)

## 2020-12-13 MED ORDER — FAMOTIDINE 20 MG PO TABS
20.0000 mg | ORAL_TABLET | Freq: Every day | ORAL | Status: DC
  Administered 2020-12-13 – 2020-12-23 (×9): 20 mg
  Filled 2020-12-13 (×9): qty 1

## 2020-12-13 MED ORDER — ARGATROBAN 50 MG/50ML IV SOLN
0.3600 ug/kg/min | INTRAVENOUS | Status: DC
  Administered 2020-12-13: 0.2 ug/kg/min via INTRAVENOUS
  Administered 2020-12-14: 0.36 ug/kg/min via INTRAVENOUS
  Filled 2020-12-13 (×2): qty 50

## 2020-12-13 MED ORDER — DEXMEDETOMIDINE HCL IN NACL 200 MCG/50ML IV SOLN
0.0000 ug/kg/h | INTRAVENOUS | Status: DC
  Administered 2020-12-13: 0.7 ug/kg/h via INTRAVENOUS
  Administered 2020-12-13: 0.6 ug/kg/h via INTRAVENOUS
  Administered 2020-12-13: 0.3 ug/kg/h via INTRAVENOUS
  Administered 2020-12-13 – 2020-12-14 (×3): 0.7 ug/kg/h via INTRAVENOUS
  Filled 2020-12-13 (×6): qty 50

## 2020-12-13 MED ORDER — CLONIDINE HCL 0.2 MG PO TABS
0.1000 mg | ORAL_TABLET | Freq: Three times a day (TID) | ORAL | Status: DC
  Administered 2020-12-13 – 2020-12-19 (×14): 0.1 mg
  Filled 2020-12-13 (×16): qty 1

## 2020-12-13 MED ORDER — HYDROCORTISONE NA SUCCINATE PF 100 MG IJ SOLR
50.0000 mg | Freq: Two times a day (BID) | INTRAMUSCULAR | Status: DC
  Administered 2020-12-13 – 2020-12-16 (×7): 50 mg via INTRAVENOUS
  Filled 2020-12-13 (×7): qty 2

## 2020-12-13 MED ORDER — SODIUM PHOSPHATES 45 MMOLE/15ML IV SOLN
30.0000 mmol | Freq: Once | INTRAVENOUS | Status: AC
  Administered 2020-12-13: 30 mmol via INTRAVENOUS
  Filled 2020-12-13: qty 10

## 2020-12-13 NOTE — Progress Notes (Signed)
ANTICOAGULATION CONSULT NOTE   Pharmacy Consult for Argartroban Indication: DVT, under investigation for HIIT  No Known Allergies  Patient Measurements: Height: 5' 8" (172.7 cm) Weight: 112.6 kg (248 lb 3.8 oz) IBW/kg (Calculated) : 68.4 TBW 112.6 kg  Vital Signs: Temp: 98.2 F (36.8 C) (08/05 2330) Temp Source: Axillary (08/05 2330) BP: 145/72 (08/06 0329) Pulse Rate: 77 (08/06 0329)  Labs: Recent Labs    12/10/20 0412 12/10/20 1714 12/11/20 0500 12/11/20 1553 12/12/20 0432 12/12/20 1600 12/12/20 2019 12/13/20 0312  HGB 9.3*  --  9.1*  --  8.9*  --   --   --   HCT 26.3*  --  26.4*  --  26.2*  --   --   --   PLT 138*  --  103*  --  90*  --   --   --   APTT 59*  --  45*  --  41*  --  47* 45*  CREATININE 1.76*   < > 1.58* 1.54* 1.38* 1.46*  --   --    < > = values in this interval not displayed.    Estimated Creatinine Clearance: 62.2 mL/min (A) (by C-G formula based on SCr of 1.46 mg/dL (H)).  Medical History: Past Medical History:  Diagnosis Date   Acute ischemic left MCA stroke (HCC) 04/09/2014   Acute respiratory failure with hypoxia (HCC) 04/09/2014   Aspiration pneumonia (HCC)    Asthma    as a child   Brain tumor (HCC)    frontal   Cancer (HCC)    Confusion    occasionally   Diabetes mellitus without complication (HCC)    takes Metformin daily.   Dizziness    Dyspnea    pt states d/t weight   Epilepsy (HCC)    takes Keppra daily   GERD (gastroesophageal reflux disease)    takes Omeprazole daily   Headache    Hyperlipidemia    takes Atorvastatin daily   Hypertension    takes Lotrel daily   Peripheral edema    takes Lasix daily   Peripheral neuropathy    takes Gabapentin daily   Pneumonia 3 yrs ago   hx of   Seizures (HCC)    Sleep apnea    Urinary frequency    Medications:  Scheduled:   albuterol  2.5 mg Nebulization QID   chlorhexidine gluconate (MEDLINE KIT)  15 mL Mouth Rinse BID   Chlorhexidine Gluconate Cloth  6 each Topical  Daily   clonazepam  0.5 mg Per Tube BID   docusate  100 mg Per Tube BID   feeding supplement (PROSource TF)  45 mL Per Tube TID   feeding supplement (VITAL HIGH PROTEIN)  1,000 mL Per Tube Q24H   hydrocerin   Topical BID   hydrocortisone sod succinate (SOLU-CORTEF) inj  100 mg Intravenous Q8H   insulin aspart  0-15 Units Subcutaneous Q4H   mouth rinse  15 mL Mouth Rinse 10 times per day   OLANZapine  5 mg Per Tube QHS   polyethylene glycol  17 g Per Tube Daily   Infusions:    prismasol BGK 4/2.5 400 mL/hr at 12/12/20 1030    prismasol BGK 4/2.5 200 mL/hr at 12/13/20 0234   sodium chloride 10 mL/hr at 12/13/20 0300   sodium chloride Stopped (12/12/20 1729)   anticoagulant sodium citrate     argatroban 0.3 mcg/kg/min (12/13/20 0300)   ceFEPime (MAXIPIME) IV Stopped (12/13/20 0049)   dexmedetomidine (PRECEDEX) IV infusion 0.6 mcg/kg/hr (  12/13/20 0300)   famotidine (PEPCID) IV Stopped (12/12/20 2351)   levETIRAcetam Stopped (12/12/20 2131)   metronidazole Stopped (12/12/20 2309)   prismasol BGK 4/2.5 1,800 mL/hr at 12/13/20 0204   promethazine (PHENERGAN) injection (IM or IVPB)     thiamine injection Stopped (12/12/20 2206)   Assessment: 65 yoM, intubated, on CRRT, with platelets decreasing. Onc doubts HITT, likely due to chemo/sepsis, but HIIT panel ordered,LE dopplers ordered > RLE DVT, need to use direct thrombin inhibitor - best choice Argatroban  12/13/2020: aPTT=45sec; still low/actually decreased despite rate increase to Argatroban 0.3mcg/kg/min No bleeding or infusion related issues per RN CBC:  Hg 9.6, pltc 94- low but improved from yesterday SRA IP  Goal of Therapy:  aPTT 50-90 seconds Monitor platelets by anticoagulation protocol: Yes   Plan:  Argatroban - increase to 0.4mcg/kg/min, using 112.6 kg = 2.7 ml/hr Recheck aPTT in 4 hrs after rate change (goal is two aPTT levels in range, then can check aPTT q12-24h) Daily CBC  Michelle  PharmD 12/13/2020,3:50  AM   

## 2020-12-13 NOTE — Progress Notes (Signed)
[place] for Argartroban Indication: DVT, under investigation for HIIT  No Known Allergies  Patient Measurements: Height: 5\' 8"  (172.7 cm) Weight: 111.5 kg (245 lb 13 oz) IBW/kg (Calculated) : 68.4 TBW 112.6 kg  Vital Signs: Temp: 98.8 F (37.1 C) (08/06 0515) Temp Source: Axillary (08/06 0515) BP: 132/62 (08/06 1000) Pulse Rate: 78 (08/06 0811)  Labs: Recent Labs    12/11/20 0500 12/11/20 1553 12/12/20 0432 12/12/20 1600 12/12/20 2019 12/13/20 0312 12/13/20 0930  HGB 9.1*  --  8.9*  --   --  9.6*  --   HCT 26.4*  --  26.2*  --   --  29.6*  --   PLT 103*  --  90*  --   --  94*  --   APTT 45*  --  41*  --  47* 45* 140*  CREATININE 1.58*   < > 1.38* 1.46*  --  1.34*  --    < > = values in this interval not displayed.    Estimated Creatinine Clearance: 67.4 mL/min (A) (by C-G formula based on SCr of 1.34 mg/dL (H)).  Assessment: 57 yoM, intubated, on CRRT, with platelets decreasing. Onc doubts HITT, likely due to chemo/sepsis, but HIIT panel ordered,LE dopplers ordered > RLE DVT, need to use direct thrombin inhibitor - best choice Argatroban  12/13/2020: aPTT=140sec; now supratherapeutic on Argatroban 0.80mcg/kg/min No infusion related issues per RN, discussed minor bleeding issues from skin CBC:  Hg 9.6, pltc 94 - low but improved from yesterday SRA and HIT Ab IP  Goal of Therapy:  aPTT 50-90 seconds Monitor platelets by anticoagulation protocol: Yes   Plan:  Hold argatroban x 1 hour When resumes, decrease to 0.2 mcg/kg/min, using 111.5 kg = 1.34 ml/hr Recheck aPTT in 4 hrs after rate change (goal is two aPTT levels in range, then can check aPTT q12-24h) Daily CBC  Peggyann Juba, PharmD, BCPS Pharmacy: [phone] 12/13/2020,11:12 AM

## 2020-12-13 NOTE — Progress Notes (Signed)
Pharmacy: Re- Argatroban  Patient's a 65 y.o M with acute RLE DVT currently on argatroban for r/o HITT.    - on CRRT - HIT antb: 0.144 (0 - 0.400) - SRA: pending   - plts up slightly to 94K - hgb stable, no bleeding documented  - aPTT is subtherapeutic at  41 sec  (level drawn from HD cath) with argatroban drip infusing at 0.2 mcg/kg/min - of note, the blood sample for the aPTT of 140 sec collected at 0930 this morning came from the line that the argatroban infusion was infusing. This may explain why this is more elevated compared to other levels  Goal of Therapy:  aPTT 50-90 seconds Monitor platelets by anticoagulation protocol: Yes   Plan: - Increase argatroban rate up to 0.3 mcg/kg/min - check 4 hr aPTT  Dia Sitter, PharmD, BCPS 12/13/2020 5:12 PM

## 2020-12-13 NOTE — Progress Notes (Signed)
Patient's precedex order was written for a weight of 112.6 kg however his weight has changed to 111.5 kg. Pharmacy notified. Renae Gloss 12/13/2020 0827 am

## 2020-12-13 NOTE — Progress Notes (Signed)
Patient ID: Thomas [name], male   DOB: 1955-08-16, 65 y.o.   MRN: [id number] S: Pt seen and evaluated while on CVVHDF.  Pt diagnosed with right leg DVT and started on argatroban and taken off of heparin with CRRT.  HITT panel still pending. O:BP (!) 148/72   Pulse 72   Temp 98.8 F (37.1 C) (Axillary)   Resp (!) 26   Ht _0  (1.727 m)   Wt 111.5 kg   SpO2 92%   BMI 37.38 kg/m   Intake/Output Summary (Last 24 hours) at 12/13/2020 0750 Last data filed at 12/13/2020 0700 Gross per 24 hour  Intake 2670.34 ml  Output 4359.6 ml  Net -1689.26 ml   Intake/Output: I/O last 3 completed shifts: In: 3766.1 [I.V.:1141.4; Other:10; NG/GT:1300; IV Piggyback:1314.7] Out: 6119.7 [Other:5044.7; ENMMH:6808]  Intake/Output this shift:  No intake/output data recorded. Weight change: -1.1 kg UPJ:SRPRXYVOP and sedated CVS: RRR Resp: occ rhonchi Abd: +BS, soft, NT Ext: 1-2+ anasarca to sacrum  Recent Labs  Lab 12/08/20 1521 12/09/20 0416 12/09/20 0916 12/09/20 1555 12/10/20 0412 12/10/20 1714 12/11/20 0500 12/11/20 1553 12/12/20 0432 12/12/20 1600 12/13/20 0312  NA 139   < >  --    < > 138 137 138 137 137 136 136  K 4.8   < >  --    < > 4.1 3.9 3.8 4.1 3.9 4.2 4.0  CL 108   < >  --    < > 106 103 105 107 106 103 101  CO2 19*   < >  --    < > 20* 20* 21* 21* 21* 22 24  GLUCOSE 114*   < >  --    < > 131* 153* 174* 200* 191* 242* 235*  BUN 37*   < >  --    < > _1 24*  CREATININE 2.45*   < >  --    < > 1.76* 1.56* 1.58* 1.54* 1.38* 1.46* 1.34*  ALBUMIN 1.8*  1.8*   < > 1.7*   < > 1.6* 1.7* 1.7* 1.8* 1.7* 1.9* 1.9*  CALCIUM 7.2*   < >  --    < > 7.2* 7.2* 7.3* 7.0* 7.2* 7.1* 7.2*  PHOS 3.1   < >  --    < > 1.9* 3.2 2.0* 3.2 1.8* 2.4* 1.5*  AST 43*  --  46*  --   --   --   --   --   --   --   --   ALT 23  --  23  --   --   --   --   --   --   --   --    < > = values in this interval not displayed.   Liver Function Tests: Recent Labs  Lab 12/08/20 1521 12/09/20 0416  12/09/20 0916 12/09/20 1555 12/12/20 0432 12/12/20 1600 12/13/20 0312  AST 43*  --  46*  --   --   --   --   ALT 23  --  23  --   --   --   --   ALKPHOS 61  --  74  --   --   --   --   BILITOT 2.1*  --  2.0*  --   --   --   --   PROT 5.4*  --  5.4*  --   --   --   --   ALBUMIN  1.8*  1.8*   < > 1.7*   < > 1.7* 1.9* 1.9*   < > = values in this interval not displayed.   No results for input(s): LIPASE, AMYLASE in the last 168 hours. No results for input(s): AMMONIA in the last 168 hours. CBC: Recent Labs  Lab 12/06/20 2119 12/07/20 0932 12/09/20 0416 12/10/20 0412 12/11/20 0500 12/12/20 0432 12/13/20 0312  WBC 6.1   < > 17.0* 14.9* 14.4* 13.6* 13.7*  NEUTROABS 3.9  --   --   --   --  10.1*  --   HGB 8.8*   < > 10.1* 9.3* 9.1* 8.9* 9.6*  HCT 25.6*   < > 28.6* 26.3* 26.4* 26.2* 29.6*  MCV 76.9*   < > 76.7* 77.4* 79.0* 81.1 85.1  PLT 295   < > 161 138* 103* 90* 94*   < > = values in this interval not displayed.   Cardiac Enzymes: No results for input(s): CKTOTAL, CKMB, CKMBINDEX, TROPONINI in the last 168 hours. CBG: Recent Labs  Lab 12/12/20 1120 12/12/20 1558 12/12/20 2014 12/13/20 0023 12/13/20 0457  GLUCAP 205* 258* 229* 234* 260*    Iron Studies: No results for input(s): IRON, TIBC, TRANSFERRIN, FERRITIN in the last 72 hours. Studies/Results: DG CHEST PORT 1 VIEW  Result Date: 12/12/2020 CLINICAL DATA:  Endotracheal tube position EXAM: PORTABLE CHEST 1 VIEW COMPARISON:  Chest radiograph 12/09/2020 FINDINGS: The endotracheal tube tip is in the midthoracic trachea. A right chest wall port is unchanged. The enteric catheter tip is in the stomach. The cardiomediastinal silhouette is stable. There is no focal consolidation or pulmonary edema. There is no pleural effusion or pneumothorax. The bones are stable. IMPRESSION: Endotracheal tube tip in the midthoracic trachea. Electronically Signed   By: Valetta Mole MD   On: 12/12/2020 08:09   VAS [place] LOWER EXTREMITY VENOUS  (DVT)  Result Date: 12/12/2020  Lower Venous DVT Study Patient Name:  Thomas [name]  Date of Exam:   12/12/2020 Medical Rec #: [id number]     Accession #:    [id number] Date of Birth: 01-07-1956     Patient Gender: M Patient Age:   85 years Exam Location:  [facility] Procedure:      VAS [place] LOWER EXTREMITY VENOUS (DVT) Referring Phys: MURALI RAMASWAMY --------------------------------------------------------------------------------  Indications: Edema.  Limitations: Body habitus, poor ultrasound/tissue interface and continuous HD to left groin. Comparison Study: Previous exam 1.5.2013 negative for DVT Performing Technologist: Jody Hill RVT, RDMS  Examination Guidelines: A complete evaluation includes B-mode imaging, spectral Doppler, color Doppler, and power Doppler as needed of all accessible portions of each vessel. Bilateral testing is considered an integral part of a complete examination. Limited examinations for reoccurring indications may be performed as noted. The reflux portion of the exam is performed with the patient in reverse Trendelenburg.  +---------+---------------+---------+-----------+----------+--------------+ RIGHT    CompressibilityPhasicitySpontaneityPropertiesThrombus Aging +---------+---------------+---------+-----------+----------+--------------+ CFV      Partial        Yes      Yes                  Acute          +---------+---------------+---------+-----------+----------+--------------+ SFJ      Partial                                      Acute          +---------+---------------+---------+-----------+----------+--------------+  FV Prox  None           No       No                   Acute          +---------+---------------+---------+-----------+----------+--------------+ FV Mid   Full           Yes      Yes                                 +---------+---------------+---------+-----------+----------+--------------+ FV DistalFull           Yes       Yes                                 +---------+---------------+---------+-----------+----------+--------------+ PFV      None           No       No                   Acute          +---------+---------------+---------+-----------+----------+--------------+ POP      None           No       No                   Acute          +---------+---------------+---------+-----------+----------+--------------+ PTV                                                   Not visualized +---------+---------------+---------+-----------+----------+--------------+ PERO                                                  Not visualized +---------+---------------+---------+-----------+----------+--------------+   Right Technical Findings: Not visualized segments include peroneal and posterior tibial veins.  +---------+---------------+---------+-----------+----------+--------------+ LEFT     CompressibilityPhasicitySpontaneityPropertiesThrombus Aging +---------+---------------+---------+-----------+----------+--------------+ CFV      Full           Yes      Yes                                 +---------+---------------+---------+-----------+----------+--------------+ SFJ      Full                                                        +---------+---------------+---------+-----------+----------+--------------+ FV Prox  Full           Yes      Yes                                 +---------+---------------+---------+-----------+----------+--------------+ FV Mid   Full           Yes      Yes                                 +---------+---------------+---------+-----------+----------+--------------+  FV DistalFull           Yes      Yes                                 +---------+---------------+---------+-----------+----------+--------------+ PFV      Full                                                         +---------+---------------+---------+-----------+----------+--------------+ POP      Full           Yes      Yes                                 +---------+---------------+---------+-----------+----------+--------------+ PTV      Full                                                        +---------+---------------+---------+-----------+----------+--------------+ PERO                                                  Not visualized +---------+---------------+---------+-----------+----------+--------------+ CFV, SFJ, PFV, and FV prox not well visualized due to HD placement. PopV difficult to image due to patient position.  Left Technical Findings: Not visualized segments include peroneal veins.   Summary: BILATERAL: - No evidence of superficial venous thrombosis in the lower extremities, bilaterally. -No evidence of popliteal cyst, bilaterally. RIGHT: - Findings consistent with acute deep vein thrombosis involving the right common femoral vein, SF junction, right femoral vein (prox), right proximal profunda vein, and right popliteal vein. - Portions of this examination were limited- see technologist comments above.  LEFT: - Portions of this examination were limited- see technologist comments above. - There is no evidence of deep vein thrombosis in the lower extremity.  *See table(s) above for measurements and observations. Electronically signed by Jamelle Haring on 12/12/2020 at 5:32:15 PM.    Final     albuterol  2.5 mg Nebulization QID   chlorhexidine gluconate (MEDLINE KIT)  15 mL Mouth Rinse BID   Chlorhexidine Gluconate Cloth  6 each Topical Daily   clonazepam  0.5 mg Per Tube BID   docusate  100 mg Per Tube BID   feeding supplement (PROSource TF)  45 mL Per Tube TID   feeding supplement (VITAL HIGH PROTEIN)  1,000 mL Per Tube Q24H   hydrocerin   Topical BID   hydrocortisone sod succinate (SOLU-CORTEF) inj  100 mg Intravenous Q8H   insulin aspart  0-15 Units Subcutaneous Q4H   mouth  rinse  15 mL Mouth Rinse 10 times per day   OLANZapine  5 mg Per Tube QHS   polyethylene glycol  17 g Per Tube Daily    BMET    Component Value Date/Time   NA 136 12/13/2020 0312   K 4.0 12/13/2020 0312   CL 101 12/13/2020 0312   CO2 24 12/13/2020 2585  GLUCOSE 235 (H) 12/13/2020 0312   BUN 24 (H) 12/13/2020 0312   CREATININE 1.34 (H) 12/13/2020 0312   CREATININE 1.83 (H) 11/19/2020 0914   CALCIUM 7.2 (L) 12/13/2020 0312   GFRNONAA 59 (L) 12/13/2020 0312   GFRNONAA 41 (L) 11/19/2020 0914   GFRAA >60 04/01/2017 1921   CBC    Component Value Date/Time   WBC 13.7 (H) 12/13/2020 0312   RBC 3.48 (L) 12/13/2020 0312   HGB 9.6 (L) 12/13/2020 0312   HGB 11.2 (L) 11/19/2020 0914   HCT 29.6 (L) 12/13/2020 0312   PLT 94 (L) 12/13/2020 0312   PLT 212 11/19/2020 0914   MCV 85.1 12/13/2020 0312   MCH 27.6 12/13/2020 0312   MCHC 32.4 12/13/2020 0312   RDW 24.6 (H) 12/13/2020 0312   LYMPHSABS 0.9 12/12/2020 0432   MONOABS 0.9 12/12/2020 0432   EOSABS 0.0 12/12/2020 0432   BASOSABS 0.1 12/12/2020 0432    Assessment/Plan:   AKI/CKD stage IIIb - presumably ischemic ATN in setting of severe sepsis and MSOF and remains oliguric.  CRRT initiated 7/31.  Running well and UF goal 50-100 ml/hr.  Off pressors and able to UF 1.2 liters overnight but remains markedly volume overloaded. CRRT orders:  4K/2.5 pre-filter at 400 ml/hr, 4K/2.5 post filter at 200 ml/hr, 4K/2.5Ca Dialysate at 1800 ml/hr Uf goal 50-100 ml/hr Heparin stopped 12/12/20 after diagnosed with RLE DVT and started on argatroban Left femoral HD catheter placed 12/07/20 Anasarca - still with marked volume overload as he had to run positive due to hypotension earlier on.  Goal uf 50-100 ml/hr and would use pressors if needed given anasarca. Given significant volume overload and large IVF requirements will continue with CRRT for now and once extubated may consider transition to IHD/transfer to [facility]. Acute hypoxic respiratory  failure/VDRF - aspiration pneumonia.  On maxepime/vanc per PCCM.  Started on precedex gtt per PCCM Right leg DVT - now on argatroban. Thrombocytopenia - presumably due to critical illness.  Hematology following and HITT panel pending.  Heparin stopped 12/12/20 after starting argatroban for RLE DVT.  Platelets improved slightly to 94.   Metabolic acidosis - nongap.  Currently on CRRT Atrial fibrillation -on amiodarone and rate controlled. Anemia of critical illness- transfuse prn. Acute metabolic encephalopathy - on fent gtt, zygpreza, and changing to precedex gtt. Hypophosphatemia - will replete IV again and follow.   Donetta Potts, MD [employer] [phone]

## 2020-12-13 NOTE — Progress Notes (Signed)
Patient's orders state to increase tube feeds to 32ml/hr however patient started to have diaphragmatic spasms. Will leave tube feeds at 30ml/hr per Dr. Melvyn Novas. Renae Gloss 12/13/2020

## 2020-12-13 NOTE — Progress Notes (Signed)
NAME:  Thomas [name], MRN:  [id number], DOB:  06-Feb-1956, LOS: 12 ADMISSION DATE:  11/30/2020, CONSULTATION DATE:  7/30 REFERRING MD:  Riccardo Dubin, CHIEF COMPLAINT:  Worsening hypoxia/dyspnea  History of Present Illness:  65 yo M admitted for worsening sepsis vs reaction to chemotherapy with high ostomy output and soft BP on 7/25. Has had issues during his hospitalization with recurrent aspirations and more recently worsening respiratory status.  Most recently pt developed worsening hypoxia and was requiring BIPAP w/ FIO2 of 100% and increased work of breathing. Unable to obtain ABG, CXR in AM of 7/30 showed some worsening concern of aspiration.   Who was admitted to intensive care and subsequently had worsening clinical status requiring intubation, CRRT, multiple pressors and pneumonia along with CT findings 8/1 concerning for ischemic bowel.  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  Hx of remote MCA stroke Asthma hx as a child Epilepsy on keppra GERD T2DM on metformin as outpt HTN HLD OSA  Significant Hospital Events:  7/27 Transferred to [facility], stopped abx as sepsis resolved/ruled out 7/30 AM Worsened AME, more hypoxic with worsening renal funciton, new aspiration on abx restarted 7/30 PM Pt with increased o2 requirement on 100% FIO2 on BIPAP, d/w family who wanted to proceed with intubation, pt intubated 8/1 Some issues with tachycardia and hypotension overnight. Reproted high ostomy output  8/2 down to 9 mcg of Levophed, however repeat CT abdomen/pelvis showed inflammatory wall thickening and fat stranding of the descending and sigmoid colon, new since prior, consistent with nonspecific infectious, inflammatory, or ischemic colitis.  Also new dense consolidative airspace opacity in the right lower and right middle lobes 8/3  further weaning NE. Weaning sedation. Full code, full scope per family meeting 8/2  8/4 off pressors, plt down to 103.  8/5 remains off pressors. Moving around more, not really following commands though   Micro: Resp viral panel 7/24 > neg covid/ flu [facility]  7/24  x 2 neg  MRSA  7/25 neg  UC   7/25  neg  C diff screeen 7/26  neg ET culture 7/31 > moderate yeast> grew mod candida tropicalis GI panel  8/1> neg   Abx: Unasyn 7/30 >>7/31 Maxepime 7/31 >> Flagyl 7/31 >> Vancomycin 8/1 only  Anidulafungin 8/1>>8/3    Scheduled Meds:  albuterol  2.5 mg Nebulization QID   chlorhexidine gluconate (MEDLINE KIT)  15 mL Mouth Rinse BID   Chlorhexidine Gluconate Cloth  6 each Topical Daily   clonazepam  0.5 mg Per Tube BID   docusate  100 mg Per Tube BID   feeding supplement (PROSource TF)  45 mL Per Tube TID   feeding supplement (VITAL HIGH PROTEIN)  1,000 mL Per Tube Q24H   hydrocerin   Topical BID   hydrocortisone sod succinate (SOLU-CORTEF) inj  100 mg Intravenous Q8H   insulin aspart  0-15 Units Subcutaneous Q4H   mouth rinse  15 mL Mouth Rinse 10 times per day   OLANZapine  5 mg Per Tube QHS   polyethylene glycol  17 g Per Tube Daily   Continuous Infusions:   prismasol BGK 4/2.5 400 mL/hr at 12/12/20 1030    prismasol BGK 4/2.5 200 mL/hr at 12/13/20 0234   sodium chloride 10 mL/hr at 12/13/20 0800   sodium chloride Stopped (12/12/20 1729)   anticoagulant sodium citrate     argatroban  0.4 mcg/kg/min (12/13/20 0800)   ceFEPime (MAXIPIME) IV Stopped (12/13/20 0049)   dexmedetomidine (PRECEDEX) IV infusion 0.5 mcg/kg/hr (12/13/20 0800)   famotidine (PEPCID) IV Stopped (12/12/20 2351)   levETIRAcetam Stopped (12/12/20 2131)   metronidazole Stopped (12/12/20 2309)   prismasol BGK 4/2.5 1,800 mL/hr at 12/13/20 0748   promethazine (PHENERGAN) injection (IM or IVPB)     sodium phosphate  Dextrose 5% IVPB     thiamine injection Stopped (12/12/20 2206)   PRN Meds:.sodium chloride,  sodium chloride, acetaminophen, alteplase, anticoagulant sodium citrate, dextrose, docusate, fentaNYL (SUBLIMAZE) injection, fentaNYL (SUBLIMAZE) injection, magic mouthwash, methocarbamol, promethazine (PHENERGAN) injection (IM or IVPB), sodium chloride, sodium chloride flush    Interim History / Subjective:  Working on weaning this am though agitation /hbp an issue with reduction in precedex and renal recs pulling negative  today on CVVH     Objective   Blood pressure (!) 141/112, pulse 75, temperature 98.8 F (37.1 C), temperature source Axillary, resp. rate 20, height 5' 8"  (1.727 m), weight 111.5 kg, SpO2 100 %.    Vent Mode: PRVC FiO2 (%):  [40 %] 40 % Set Rate:  [26 bmp] 26 bmp Vt Set:  [600 mL] 600 mL PEEP:  [8 cmH20] 8 cmH20 Plateau Pressure:  [21 cmH20-24 cmH20] 21 cmH20   Intake/Output Summary (Last 24 hours) at 12/13/2020 4268 Last data filed at 12/13/2020 0800 Gross per 24 hour  Intake 2630.65 ml  Output 4358.9 ml  Net -1728.25 ml   Filed Weights   12/11/20 0500 12/12/20 0500 12/13/20 0500  Weight: 116.5 kg 112.6 kg 111.5 kg    Tmax  99.8  still on flagyl / maxepime since 7/31  General appearance:    elderly bm on PSV wean    No jvd Oropharynx og/et  Neck supple Lungs with min scattered exp > insp rhonchi bilaterally RRR no s3 or or sign murmur Abd obese with limited  excursion  Extr warm with 1 - 2 + pitting edema LE Neuro  not f/c  no apparent motor deficits    I personally reviewed images and agree with radiology impression as follows:  CXR:   portable 8/5 There is no focal consolidation or pulmonary edema. There is no pleural effusion or pneumothorax.  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 CT a/p with inflammatory changes c/w colitis  P: -cont broad abx flagyl cefepime  - monitor ostomy outpt -  tf per nutrition as tol    AKI on CKD 3a requiring CRRT Hypophosphatemia  -likely ATN with shock -foley dc after CRRT started given minimal urine production   P: -CRRT per nephro.  -bladder scan, I/O as needed    NAGMA Lactic acidosis -- both resolved     Stg IIIb transverse colon cancer (T4H9Q) S/p partial transverse colectomy, end colectomy On CAPOX (s/p 3 cycles 6/1, 6/22. 7/13) -goal curative  P: -Follows with Dr. Ammie Dalton   HFpEF -Echocardiogram diastolic dysfunction and EF 60 to  65% P: -cardiac monitoring   Thrombocytopenia  -likely due to chemo and critical illness  P: -cont daily CBC    Hx HTN Hx HLD  -Home medication includes: Norvasc, Lipitor,  P: -8/6 added clonidine 0.1 tid since hbp coming off precedex   DM2 with hyperglycemia HHS - improved  -Home medications included Metformin  P: -SSI  Obesity, Class II Protein calorie malnutrition, moderate  P: -tf per nutrition  Stage 2 coccyx pressure injury present on admission  P: -continue >> turn, pressure alleviation, wound care  Capecitabine induced desquamation: hand/foot syndrome  -superficial dry sloughing, areas of cracking, with underlying dry beefy erythematous discoloration  P: -continue moisturizer to hands feet scrotum   Goals of care  P: -interdisciplinary GOC mtg 8/2 -- family wishes for full code full scope of offered interventions -cont daily updates    Best Practice  Diet/type: tf per nutrition DVT prophylaxis: SCD/ argatroban GI prophylaxis: H2B Lines: Port, femoral trialysis,  Foley:  N/A Code Status:   full code Last date of goals of care discussion : 8/2 : PCCM Palli, RN, family -- full code full scope    The patient is critically ill with multiple organ systems failure and requires high complexity decision making for assessment and support, frequent evaluation and titration of therapies, application of advanced monitoring technologies and extensive interpretation of multiple databases. Critical Care Time devoted to patient care services described in this note is 40 minutes.   [name], MD Pulmonary and [place] [facility] [phone]   After 7:00 pm call Elink  [phone]   [name], MD Pulmonary and [place] [phone]   After 7:00 pm call Elink  [phone]

## 2020-12-13 NOTE — Progress Notes (Signed)
Unfortunately, he now has a thrombus in the right leg.  He now is on argatroban.  His HITT panel is pending.  His platelet count is 94,000.  Once he stabilizes, I am sure we can get him on oral therapy.  He still intubated.  He still somewhat agitated.  He is getting CRRT.  His BUN is 24 creatinine 1.34.  His white cell count is 13.7.  Hemoglobin 9.6.  Platelet count 94,000.  Cultures are all still negative.  His vital signs all look pretty good.  Temperature 98.8.  Pulse 72.  Blood pressure 138/70.  I had the fact that he now has this thrombus in the leg.  I suppose this is consistent with his overall complicated medical stay with all the medical problems with sepsis.  IT will be interesting to see what the HITT panel shows.  I know he is getting outstanding care from all staff down in the ICU.  I know that he has a lot of complicated issues.  I really do not think that cancer is one of them.  He had stage IIIb cancer.  He had adjuvant chemotherapy.  He did have the dermatologic issue with chemotherapy.  This does seem to be improving.  There is still a long way to go.  I do appreciate the hard work that the ICU staff is putting in.  Lattie Haw, MD  Proverbs 11:25

## 2020-12-14 DIAGNOSIS — E1100 Type 2 diabetes mellitus with hyperosmolarity without nonketotic hyperglycemic-hyperosmolar coma (NKHHC): Secondary | ICD-10-CM | POA: Diagnosis not present

## 2020-12-14 DIAGNOSIS — J9601 Acute respiratory failure with hypoxia: Secondary | ICD-10-CM | POA: Diagnosis not present

## 2020-12-14 DIAGNOSIS — G9341 Metabolic encephalopathy: Secondary | ICD-10-CM | POA: Diagnosis not present

## 2020-12-14 DIAGNOSIS — N170 Acute kidney failure with tubular necrosis: Secondary | ICD-10-CM | POA: Diagnosis not present

## 2020-12-14 LAB — RENAL FUNCTION PANEL
Albumin: 1.9 g/dL — ABNORMAL LOW (ref 3.5–5.0)
Albumin: 1.7 g/dL — ABNORMAL LOW (ref 3.5–5.0)
Anion gap: 7 (ref 5–15)
Anion gap: 8 (ref 5–15)
BUN: 33 mg/dL — ABNORMAL HIGH (ref 8–23)
BUN: 33 mg/dL — ABNORMAL HIGH (ref 8–23)
CO2: 24 mmol/L (ref 22–32)
CO2: 26 mmol/L (ref 22–32)
Calcium: 7.2 mg/dL — ABNORMAL LOW (ref 8.9–10.3)
Calcium: 7.4 mg/dL — ABNORMAL LOW (ref 8.9–10.3)
Chloride: 104 mmol/L (ref 98–111)
Chloride: 103 mmol/L (ref 98–111)
Creatinine, Ser: 1.49 mg/dL — ABNORMAL HIGH (ref 0.61–1.24)
Creatinine, Ser: 1.61 mg/dL — ABNORMAL HIGH (ref 0.61–1.24)
GFR, Estimated: 52 mL/min — ABNORMAL LOW (ref >60)
GFR, Estimated: 47 mL/min — ABNORMAL LOW (ref >60)
Glucose, Bld: 311 mg/dL — ABNORMAL HIGH (ref 70–99)
Glucose, Bld: 283 mg/dL — ABNORMAL HIGH (ref 70–99)
Phosphorus: 2.3 mg/dL — ABNORMAL LOW (ref 2.5–4.6)
Phosphorus: 1.5 mg/dL — ABNORMAL LOW (ref 2.5–4.6)
Potassium: 4.1 mmol/L (ref 3.5–5.1)
Potassium: 3.9 mmol/L (ref 3.5–5.1)
Sodium: 135 mmol/L (ref 135–145)
Sodium: 137 mmol/L (ref 135–145)

## 2020-12-14 LAB — APTT
aPTT: 42 seconds — ABNORMAL HIGH (ref 24–36)
aPTT: 44 seconds — ABNORMAL HIGH (ref 24–36)
aPTT: 45 seconds — ABNORMAL HIGH (ref 24–36)
aPTT: 51 seconds — ABNORMAL HIGH (ref 24–36)
aPTT: 48 seconds — ABNORMAL HIGH (ref 24–36)

## 2020-12-14 LAB — GLUCOSE, CAPILLARY
Glucose-Capillary: 265 mg/dL — ABNORMAL HIGH (ref 70–99)
Glucose-Capillary: 265 mg/dL — ABNORMAL HIGH (ref 70–99)
Glucose-Capillary: 303 mg/dL — ABNORMAL HIGH (ref 70–99)
Glucose-Capillary: 311 mg/dL — ABNORMAL HIGH (ref 70–99)
Glucose-Capillary: 312 mg/dL — ABNORMAL HIGH (ref 70–99)
Glucose-Capillary: 316 mg/dL — ABNORMAL HIGH (ref 70–99)
Glucose-Capillary: 325 mg/dL — ABNORMAL HIGH (ref 70–99)

## 2020-12-14 LAB — CBC
HCT: 29.7 % — ABNORMAL LOW (ref 39.0–52.0)
Hemoglobin: 9.6 g/dL — ABNORMAL LOW (ref 13.0–17.0)
MCH: 28.1 pg (ref 26.0–34.0)
MCHC: 32.3 g/dL (ref 30.0–36.0)
MCV: 86.8 fL (ref 80.0–100.0)
Platelets: 104 10*3/uL — ABNORMAL LOW (ref 150–400)
RBC: 3.42 MIL/uL — ABNORMAL LOW (ref 4.22–5.81)
RDW: 24.1 % — ABNORMAL HIGH (ref 11.5–15.5)
WBC: 15.6 10*3/uL — ABNORMAL HIGH (ref 4.0–10.5)
nRBC: 1.3 % — ABNORMAL HIGH (ref 0.0–0.2)

## 2020-12-14 LAB — MAGNESIUM: Magnesium: 2.6 mg/dL — ABNORMAL HIGH (ref 1.7–2.4)

## 2020-12-14 MED ORDER — ARGATROBAN 50 MG/50ML IV SOLN
0.7200 ug/kg/min | INTRAVENOUS | Status: DC
  Administered 2020-12-14: 0.6 ug/kg/min via INTRAVENOUS
  Filled 2020-12-14 (×3): qty 50

## 2020-12-14 MED ORDER — CLONAZEPAM 0.5 MG PO TBDP
1.0000 mg | ORAL_TABLET | Freq: Three times a day (TID) | ORAL | Status: DC
  Administered 2020-12-14 – 2020-12-29 (×44): 1 mg
  Filled 2020-12-14 (×44): qty 2

## 2020-12-14 MED ORDER — ARGATROBAN 50 MG/50ML IV SOLN
0.4300 ug/kg/min | INTRAVENOUS | Status: DC
  Filled 2020-12-14: qty 50

## 2020-12-14 MED ORDER — DEXMEDETOMIDINE HCL IN NACL 400 MCG/100ML IV SOLN
0.0000 ug/kg/h | INTRAVENOUS | Status: DC
  Administered 2020-12-14 (×2): 0.7 ug/kg/h via INTRAVENOUS
  Administered 2020-12-14: 1 ug/kg/h via INTRAVENOUS
  Administered 2020-12-14: 0.7 ug/kg/h via INTRAVENOUS
  Administered 2020-12-14: 0.9 ug/kg/h via INTRAVENOUS
  Administered 2020-12-15: 0.6 ug/kg/h via INTRAVENOUS
  Administered 2020-12-15: 0.3 ug/kg/h via INTRAVENOUS
  Administered 2020-12-15: 0.9 ug/kg/h via INTRAVENOUS
  Administered 2020-12-16: 0.6 ug/kg/h via INTRAVENOUS
  Administered 2020-12-16 (×2): 0.9 ug/kg/h via INTRAVENOUS
  Administered 2020-12-16: 0.6 ug/kg/h via INTRAVENOUS
  Administered 2020-12-16 – 2020-12-17 (×3): 0.9 ug/kg/h via INTRAVENOUS
  Administered 2020-12-17: 1 ug/kg/h via INTRAVENOUS
  Administered 2020-12-17: 0.7 ug/kg/h via INTRAVENOUS
  Administered 2020-12-17: 0.8 ug/kg/h via INTRAVENOUS
  Administered 2020-12-17 – 2020-12-18 (×2): 1 ug/kg/h via INTRAVENOUS
  Administered 2020-12-18: 0.4 ug/kg/h via INTRAVENOUS
  Administered 2020-12-19: 0.8 ug/kg/h via INTRAVENOUS
  Administered 2020-12-19: 0.6 ug/kg/h via INTRAVENOUS
  Administered 2020-12-19 – 2020-12-20 (×3): 0.8 ug/kg/h via INTRAVENOUS
  Administered 2020-12-20: 1.2 ug/kg/h via INTRAVENOUS
  Administered 2020-12-20: 0.8 ug/kg/h via INTRAVENOUS
  Administered 2020-12-20: 0.4 ug/kg/h via INTRAVENOUS
  Administered 2020-12-20: 0.8 ug/kg/h via INTRAVENOUS
  Administered 2020-12-21: 1.2 ug/kg/h via INTRAVENOUS
  Administered 2020-12-21: 0.8 ug/kg/h via INTRAVENOUS
  Administered 2020-12-21: 0.3 ug/kg/h via INTRAVENOUS
  Administered 2020-12-21 – 2020-12-22 (×3): 0.8 ug/kg/h via INTRAVENOUS
  Administered 2020-12-22: 0.2 ug/kg/h via INTRAVENOUS
  Administered 2020-12-23: 0.4 ug/kg/h via INTRAVENOUS
  Filled 2020-12-14 (×5): qty 100
  Filled 2020-12-14: qty 200
  Filled 2020-12-14 (×28): qty 100
  Filled 2020-12-14: qty 200
  Filled 2020-12-14 (×4): qty 100

## 2020-12-14 MED ORDER — DIPHENHYDRAMINE HCL 12.5 MG/5ML PO ELIX
25.0000 mg | ORAL_SOLUTION | Freq: Once | ORAL | Status: AC
  Administered 2020-12-14: 25 mg
  Filled 2020-12-14: qty 10

## 2020-12-14 NOTE — Progress Notes (Signed)
RT NOTE:  CPT held at this time due to excessive agitation.

## 2020-12-14 NOTE — Progress Notes (Addendum)
NAME:  Thomas [name], MRN:  [id number], DOB:  1955-09-04, LOS: 75 ADMISSION DATE:  11/30/2020, CONSULTATION DATE:  7/30 REFERRING MD:  Riccardo Dubin, CHIEF COMPLAINT:  Worsening hypoxia/dyspnea  History of Present Illness:  65 yo M admitted for worsening sepsis vs reaction to chemotherapy with high ostomy output and soft BP on 7/25. Has had issues during his hospitalization with recurrent aspirations and more recently worsening respiratory status.  Most recently pt developed worsening hypoxia and was requiring BIPAP w/ FIO2 of 100% and increased work of breathing. Unable to obtain ABG, CXR in AM of 7/30 showed some worsening concern of aspiration.   Who was admitted to intensive care and subsequently had worsening clinical status requiring intubation, CRRT, multiple pressors and pneumonia along with CT findings 8/1 concerning for ischemic bowel.  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  Hx of remote MCA stroke Asthma hx as a child Epilepsy on keppra GERD T2DM on metformin as outpt HTN HLD OSA  Significant Hospital Events:  7/27 Transferred to [facility], stopped abx as sepsis resolved/ruled out 7/30 AM Worsened AME, more hypoxic with worsening renal funciton, new aspiration on abx restarted 7/30 PM Pt with increased o2 requirement on 100% FIO2 on BIPAP, d/w family who wanted to proceed with intubation, pt intubated 8/1 Some issues with tachycardia and hypotension overnight. Reproted high ostomy output  8/2 down to 9 mcg of Levophed, however repeat CT abdomen/pelvis showed inflammatory wall thickening and fat stranding of the descending and sigmoid colon, new since prior, consistent with nonspecific infectious, inflammatory, or ischemic colitis.  Also new dense consolidative airspace opacity in the right lower and right middle lobes 8/3  further weaning NE. Weaning sedation. Full code, full scope per family meeting 8/2  8/4 off pressors, plt down to 103.  8/5 remains off pressors. Moving around more, not really following commands though   Micro: Resp viral panel 7/24 > neg covid/ flu [facility]  7/24  x 2 neg  MRSA  7/25 neg  UC   7/25  neg  C diff screeen 7/26  neg ET culture 7/31 > moderate yeast> grew mod candida tropicalis GI panel  8/1> neg   Abx: Unasyn 7/30 >>7/31 Maxepime 7/31 >> 8/7 Flagyl 7/31 >>  8/7 Vancomycin 8/1 only  Anidulafungin 8/1>>8/3    Scheduled Meds:  albuterol  2.5 mg Nebulization QID   chlorhexidine gluconate (MEDLINE KIT)  15 mL Mouth Rinse BID   Chlorhexidine Gluconate Cloth  6 each Topical Daily   clonazepam  0.5 mg Per Tube BID   cloNIDine  0.1 mg Per Tube TID   docusate  100 mg Per Tube BID   famotidine  20 mg Per Tube Daily   feeding supplement (PROSource TF)  45 mL Per Tube TID   feeding supplement (VITAL HIGH PROTEIN)  1,000 mL Per Tube Q24H   hydrocerin   Topical BID   hydrocortisone sodium succinate  50 mg Intravenous Q12H   insulin aspart  0-15 Units Subcutaneous Q4H   mouth rinse  15 mL Mouth Rinse 10 times per day   OLANZapine  5 mg Per Tube QHS   polyethylene glycol  17 g Per Tube Daily   Continuous Infusions:   prismasol BGK 4/2.5 400 mL/hr at 12/14/20 0118    prismasol BGK 4/2.5 200 mL/hr at 12/14/20 0410   sodium chloride 10 mL/hr at  12/14/20 0800   sodium chloride Stopped (12/12/20 1729)   anticoagulant sodium citrate     argatroban 0.43 mcg/kg/min (12/14/20 0800)   ceFEPime (MAXIPIME) IV Stopped (12/14/20 0039)   dexmedetomidine (PRECEDEX) IV infusion 0.8 mcg/kg/hr (12/14/20 0800)   levETIRAcetam Stopped (12/13/20 2200)   metronidazole Stopped (12/13/20 2336)   prismasol BGK 4/2.5 1,800 mL/hr at 12/14/20 0643   promethazine (PHENERGAN) injection (IM or IVPB)     thiamine injection Stopped (12/13/20 2231)   PRN Meds:.sodium chloride, sodium chloride,  acetaminophen, alteplase, anticoagulant sodium citrate, dextrose, docusate, fentaNYL (SUBLIMAZE) injection, fentaNYL (SUBLIMAZE) injection, magic mouthwash, methocarbamol, promethazine (PHENERGAN) injection (IM or IVPB), sodium chloride, sodium chloride flush    Interim History / Subjective:   Failed wean attempt due to agitation despite precedex  and prn IV fentanyl as well as clonidine and pulling neg of CVVH    Objective   Blood pressure 135/67, pulse 77, temperature 98.9 F (37.2 C), temperature source Axillary, resp. rate (!) 26, height 5' 8"  (1.727 m), weight 110.9 kg, SpO2 96 %.    Vent Mode: PRVC FiO2 (%):  [40 %] 40 % Set Rate:  [26 bmp] 26 bmp Vt Set:  [600 mL] 600 mL PEEP:  [5 cmH20] 5 cmH20 Pressure Support:  [10 cmH20] 10 cmH20 Plateau Pressure:  [17 cmH20-20 cmH20] 19 cmH20   Intake/Output Summary (Last 24 hours) at 12/14/2020 0804 Last data filed at 12/14/2020 0800 Gross per 24 hour  Intake 3065.14 ml  Output 5638.75 ml  Net -2573.61 ml   Filed Weights   12/12/20 0500 12/13/20 0500 12/14/20 0500  Weight: 112.6 kg 111.5 kg 110.9 kg    Tmax 99.6 (no real change)  General appearance:   easily agitated when stimulated   No jvd Oropharynx ET/og Neck supple Lungs with a few scattered exp > insp rhonchi bilaterally RRR no s3 or or sign murmur Abd obese with limited  excursion  Extr warm with 1+ pitting edema Neuro  Sensorium sedated but easily agitated,  moves all 4 but not f/c  Resolved Hospital Problem list   Shock  HHS Afib RVR  Lactic Acidosis    Assessment & Plan:   Acute metabolic encephalopathy Hx seizure disorder Hx L CVA (2015) P: -Keppra continue  -sz precautions - try to wean precedex with rx clonidine started 8/6 am > still agitated when not heavily sedated am 8/7 preventing extubations attermpt - may just extubate and observe 8/8 as part of his agitation may be the tube   Acute hypoxic respiratory failure Aspiration PNA -candida tropicalis  on trach aspirate but not felt to be pathogenic Diaphragmatic spasm P: -VAP, pulm hygiene -working on weaning 8/7 ? Extubate am 8/8  Severe sepsis due to aspiration PNA and colitis - shock improved  -recurrent aspiration. CT a/p with inflammatory changes c/w colitis  P: - d/c flagyl cefepime 8/7 am  - monitor ostomy outpt -  tf per nutrition as tol    AKI on CKD 3a requiring CRRT Hypophosphatemia  -likely ATN with shock -foley dc after CRRT started given minimal urine production >> Phos 2.3 no need to rx unless renal feels it's needed since on CVVH    P: -CRRT per nephro.  -bladder scan, I/O as needed        Stg IIIb transverse colon cancer (B6L8L) S/p partial transverse colectomy, end colectomy On CAPOX (s/p 3 cycles 6/1, 6/22. 7/13) -goal curative  P: -Follows with Dr. Ammie Dalton   HFpEF -Echocardiogram diastolic dysfunction and EF 60 to  65% P: -cardiac monitoring   Thrombocytopenia  -likely due to chemo and critical illness  P: -cont daily CBC    Hx HTN Hx HLD  -Home medication includes: Norvasc, Lipitor,  P: -8/6 added clonidine 0.1 tid since hbp coming off precedex   DM2 with hyperglycemia HHS - improved  -Home medications included Metformin  P: -SSI  Obesity, Class II Protein calorie malnutrition, moderate  P: -tf per nutrition  Stage 2 coccyx pressure injury present on admission  P: -continue >> turn, pressure alleviation, wound care  Capecitabine induced desquamation: hand/foot syndrome  -superficial dry sloughing, areas of cracking, with underlying dry beefy erythematous discoloration  P: -continue moisturizer to hands feet scrotum   Goals of care  P: -interdisciplinary GOC mtg 8/2 -- family wishes for full code full scope of offered interventions -cont daily updates    Best Practice  Diet/type: tf per nutrition DVT prophylaxis: SCD/ argatroban GI prophylaxis: H2B Lines: Port, femoral trialysis,  Foley:  N/A Code Status:  full  code Last date of goals of care discussion : 8/2 : PCCM Palli, RN, family -- full code full scope      The patient is critically ill with multiple organ systems failure and requires high complexity decision making for assessment and support, frequent evaluation and titration of therapies, application of advanced monitoring technologies and extensive interpretation of multiple databases. Critical Care Time devoted to patient care services described in this note is 35 minutes.   [name], MD Pulmonary and [place] [facility] [phone]   After 7:00 pm call Elink  [phone]     [name], MD Pulmonary and [place] [phone]   After 7:00 pm call Elink  [phone]

## 2020-12-14 NOTE — Progress Notes (Signed)
Pharmacy: Re- Argatroban  Patient's a 65 y.o M with acute RLE DVT currently on argatroban for r/o HITT.    - on CRRT - HIT antb: 0.144 (0 - 0.400) - SRA: pending   - plts up 104K - hgb stable, no bleeding documented  - aPTT is therapeutic at 51 sec with argatroban drip adjusted up to 0.6 mcg/kg/min  Goal of Therapy:  aPTT 50-90 seconds Monitor platelets by anticoagulation protocol: Yes   Plan: - continue argatroban rate at 0.6 mcg/kg/min - check another 4 hr aPTT to ensure level is still therapeutic before changing to q12h aPTT monitoring  Dia Sitter, PharmD, BCPS 12/14/2020 6:25 PM

## 2020-12-14 NOTE — Progress Notes (Signed)
Pharmacy: Re- Argatroban  Patient's a 65 y.o M with acute RLE DVT currently on argatroban for r/o HITT.    - on CRRT - HIT antb: 0.144 (0 - 0.400) - SRA: pending   - aPTT remains subtherapeutic 45 sec despite multiple rate increases with argatroban infusing at 0.43 mcg/kg/min - Hgb 9.6 (stable), Plt 104 (improved) - no complication of therapy noted  Goal of Therapy:  aPTT 50-90 seconds Monitor platelets by anticoagulation protocol: Yes   Plan: - Increase argatroban rate up to 0.6 mcg/kg/min - check 4 hr aPTT after rate increase  Peggyann Juba, PharmD, BCPS Pharmacy: [phone] 12/14/2020 1:01 PM

## 2020-12-14 NOTE — Progress Notes (Signed)
[place] Progress Note Patient Name: Thomas [name] DOB: 10-19-1955 MRN: [id number]   Date of Service  12/14/2020  HPI/Events of Note  Calcium 7,2, corrects to 9.3 (albumin 1.9).   eICU Interventions  No replacement required at this time.     Intervention Category Minor Interventions: Electrolytes abnormality - evaluation and management  Marily Lente Leigha Olberding 12/14/2020, 5:12 AM

## 2020-12-14 NOTE — Progress Notes (Signed)
eLink Physician-Brief Progress Note Patient Name: KOLTEN RYBACK DOB: [birth year] MRN: [id number]   Date of Service  12/14/2020  HPI/Events of Note  Pruritus. RN requests benadryl per tube.   eICU Interventions  Ordered diphenhydramine elixir 25mg  per tube x1 dose.     Intervention Category Minor Interventions: Routine modifications to care plan (e.g. PRN medications for pain, fever)  Marily Lente Gabbie Marzo 12/14/2020, 6:53 AM

## 2020-12-14 NOTE — Progress Notes (Signed)
Pharmacy: Re- Argatroban  Patient's a 65 y.o M with acute RLE DVT currently on argatroban for r/o HITT.    - on CRRT - HIT antb: 0.144 (0 - 0.400) - SRA: pending   - aPTT is subtherapeutic at  42 sec with argatroban drip infusing at 0.3 mcg/kg/min - no complication of therapy noted  Goal of Therapy:  aPTT 50-90 seconds Monitor platelets by anticoagulation protocol: Yes   Plan: - Increase argatroban rate up to 0.36 mcg/kg/min - check 4 hr aPTT after rate increase  Leone Haven, PharmD 12/14/2020 1:34 AM

## 2020-12-14 NOTE — Progress Notes (Signed)
Pharmacy: Re- Argatroban  Patient's a 65 y.o M with acute RLE DVT currently on argatroban for r/o HITT.    - on CRRT - HIT antb: 0.144 (0 - 0.400) - SRA: pending   - aPTT is subtherapeutic again at 44 sec with argatroban drip infusing at 0.36 mcg/kg/min - Hgb 9.6 (stable), Plt 104 (improved) - no complication of therapy noted  Goal of Therapy:  aPTT 50-90 seconds Monitor platelets by anticoagulation protocol: Yes   Plan: - Increase argatroban rate up to 0.43 mcg/kg/min - check 4 hr aPTT after rate increase  Peggyann Juba, PharmD, BCPS Pharmacy: [phone] 12/14/2020 7:40 AM

## 2020-12-14 NOTE — Progress Notes (Signed)
RT tried to do neb treatment for the patient but the ventilator screen was locked, was not able to make any changes to the vent. New vent set up done, removed pt from the vent and bagged him, placed pt on the new vent without any complications. VSS. RT will continue to monitor.

## 2020-12-14 NOTE — Progress Notes (Addendum)
Patient ID: Thomas [name], male   DOB: 06-25-1955, 65 y.o.   MRN: [id number] S: intubated and sedated.  Failed wean trial due to agitation.  Pt seen and examined while on CRRT, orders reviewed. O:BP (!) 123/59   Pulse 77   Temp 98.4 F (36.9 C) (Axillary)   Resp (!) 26   Ht 5' 8"  (1.727 m)   Wt 110.9 kg   SpO2 98%   BMI 37.17 kg/m   Intake/Output Summary (Last 24 hours) at 12/14/2020 1125 Last data filed at 12/14/2020 1100 Gross per 24 hour  Intake 3114.76 ml  Output 5486.2 ml  Net -2371.44 ml   Intake/Output: I/O last 3 completed shifts: In: 4305.7 [I.V.:963.9; NG/GT:2027; IV Piggyback:1314.8] Out: 7747.7 [Other:6660.7; BWLSL:3734]  Intake/Output this shift:  Total I/O In: 609.3 [I.V.:141.3; Other:33; NG/GT:285; IV Piggyback:150] Out: 525 [Other:508; Stool:17] Weight change: -0.6 kg KAJ:GOTLXBWIO and sedated CVS: RRR Resp: occ rhonchi Abd: +BS, soft, NT/ND Ext: 1+ anasarca  Recent Labs  Lab 12/08/20 1521 12/09/20 0416 12/09/20 0916 12/09/20 1555 12/11/20 0500 12/11/20 1553 12/12/20 0432 12/12/20 1600 12/13/20 0312 12/13/20 1640 12/14/20 0400  NA 139   < >  --    < > 138 137 137 136 136 137 135  K 4.8   < >  --    < > 3.8 4.1 3.9 4.2 4.0 3.9 4.1  CL 108   < >  --    < > 105 107 106 103 101 103 104  CO2 19*   < >  --    < > 21* 21* 21* 22 24 23 24   GLUCOSE 114*   < >  --    < > 174* 200* 191* 242* 235* 329* 311*  BUN 37*   < >  --    < > 20 20 20 23  24* 28* 33*  CREATININE 2.45*   < >  --    < > 1.58* 1.54* 1.38* 1.46* 1.34* 1.54* 1.49*  ALBUMIN 1.8*  1.8*   < > 1.7*   < > 1.7* 1.8* 1.7* 1.9* 1.9* 1.9* 1.9*  CALCIUM 7.2*   < >  --    < > 7.3* 7.0* 7.2* 7.1* 7.2* 7.4* 7.2*  PHOS 3.1   < >  --    < > 2.0* 3.2 1.8* 2.4* 1.5* 3.4 2.3*  AST 43*  --  46*  --   --   --   --   --   --   --   --   ALT 23  --  23  --   --   --   --   --   --   --   --    < > = values in this interval not displayed.   Liver Function Tests: Recent Labs  Lab 12/08/20 1521 12/09/20 0416  12/09/20 0916 12/09/20 1555 12/13/20 0312 12/13/20 1640 12/14/20 0400  AST 43*  --  46*  --   --   --   --   ALT 23  --  23  --   --   --   --   ALKPHOS 61  --  74  --   --   --   --   BILITOT 2.1*  --  2.0*  --   --   --   --   PROT 5.4*  --  5.4*  --   --   --   --   ALBUMIN 1.8*  1.8*   < >  1.7*   < > 1.9* 1.9* 1.9*   < > = values in this interval not displayed.   No results for input(s): LIPASE, AMYLASE in the last 168 hours. No results for input(s): AMMONIA in the last 168 hours. CBC: Recent Labs  Lab 12/10/20 0412 12/11/20 0500 12/12/20 0432 12/13/20 0312 12/14/20 0400  WBC 14.9* 14.4* 13.6* 13.7* 15.6*  NEUTROABS  --   --  10.1*  --   --   HGB 9.3* 9.1* 8.9* 9.6* 9.6*  HCT 26.3* 26.4* 26.2* 29.6* 29.7*  MCV 77.4* 79.0* 81.1 85.1 86.8  PLT 138* 103* 90* 94* 104*   Cardiac Enzymes: No results for input(s): CKTOTAL, CKMB, CKMBINDEX, TROPONINI in the last 168 hours. CBG: Recent Labs  Lab 12/13/20 1546 12/13/20 2110 12/14/20 0005 12/14/20 0403 12/14/20 0747  GLUCAP 313* 158* 316* 311* 265*    Iron Studies: No results for input(s): IRON, TIBC, TRANSFERRIN, FERRITIN in the last 72 hours. Studies/Results: VAS [place] LOWER EXTREMITY VENOUS (DVT)  Result Date: 12/12/2020  Lower Venous DVT Study Patient Name:  Thomas [name]  Date of Exam:   12/12/2020 Medical Rec #: [id number]     Accession #:    [id number] Date of Birth: 12-15-1955     Patient Gender: M Patient Age:   76 years Exam Location:  [facility] Procedure:      VAS [place] LOWER EXTREMITY VENOUS (DVT) Referring Phys: MURALI RAMASWAMY --------------------------------------------------------------------------------  Indications: Edema.  Limitations: Body habitus, poor ultrasound/tissue interface and continuous HD to left groin. Comparison Study: Previous exam 1.5.2013 negative for DVT Performing Technologist: Jody Hill RVT, RDMS  Examination Guidelines: A complete evaluation includes B-mode imaging, spectral Doppler,  color Doppler, and power Doppler as needed of all accessible portions of each vessel. Bilateral testing is considered an integral part of a complete examination. Limited examinations for reoccurring indications may be performed as noted. The reflux portion of the exam is performed with the patient in reverse Trendelenburg.  +---------+---------------+---------+-----------+----------+--------------+ RIGHT    CompressibilityPhasicitySpontaneityPropertiesThrombus Aging +---------+---------------+---------+-----------+----------+--------------+ CFV      Partial        Yes      Yes                  Acute          +---------+---------------+---------+-----------+----------+--------------+ SFJ      Partial                                      Acute          +---------+---------------+---------+-----------+----------+--------------+ FV Prox  None           No       No                   Acute          +---------+---------------+---------+-----------+----------+--------------+ FV Mid   Full           Yes      Yes                                 +---------+---------------+---------+-----------+----------+--------------+ FV DistalFull           Yes      Yes                                 +---------+---------------+---------+-----------+----------+--------------+  PFV      None           No       No                   Acute          +---------+---------------+---------+-----------+----------+--------------+ POP      None           No       No                   Acute          +---------+---------------+---------+-----------+----------+--------------+ PTV                                                   Not visualized +---------+---------------+---------+-----------+----------+--------------+ PERO                                                  Not visualized +---------+---------------+---------+-----------+----------+--------------+   Right Technical Findings:  Not visualized segments include peroneal and posterior tibial veins.  +---------+---------------+---------+-----------+----------+--------------+ LEFT     CompressibilityPhasicitySpontaneityPropertiesThrombus Aging +---------+---------------+---------+-----------+----------+--------------+ CFV      Full           Yes      Yes                                 +---------+---------------+---------+-----------+----------+--------------+ SFJ      Full                                                        +---------+---------------+---------+-----------+----------+--------------+ FV Prox  Full           Yes      Yes                                 +---------+---------------+---------+-----------+----------+--------------+ FV Mid   Full           Yes      Yes                                 +---------+---------------+---------+-----------+----------+--------------+ FV DistalFull           Yes      Yes                                 +---------+---------------+---------+-----------+----------+--------------+ PFV      Full                                                        +---------+---------------+---------+-----------+----------+--------------+ POP      Full  Yes      Yes                                 +---------+---------------+---------+-----------+----------+--------------+ PTV      Full                                                        +---------+---------------+---------+-----------+----------+--------------+ PERO                                                  Not visualized +---------+---------------+---------+-----------+----------+--------------+ CFV, SFJ, PFV, and FV prox not well visualized due to HD placement. PopV difficult to image due to patient position.  Left Technical Findings: Not visualized segments include peroneal veins.   Summary: BILATERAL: - No evidence of superficial venous thrombosis in the lower  extremities, bilaterally. -No evidence of popliteal cyst, bilaterally. RIGHT: - Findings consistent with acute deep vein thrombosis involving the right common femoral vein, SF junction, right femoral vein (prox), right proximal profunda vein, and right popliteal vein. - Portions of this examination were limited- see technologist comments above.  LEFT: - Portions of this examination were limited- see technologist comments above. - There is no evidence of deep vein thrombosis in the lower extremity.  *See table(s) above for measurements and observations. Electronically signed by Jamelle Haring on 12/12/2020 at 5:32:15 PM.    Final     albuterol  2.5 mg Nebulization QID   chlorhexidine gluconate (MEDLINE KIT)  15 mL Mouth Rinse BID   Chlorhexidine Gluconate Cloth  6 each Topical Daily   clonazepam  1 mg Per Tube TID   cloNIDine  0.1 mg Per Tube TID   docusate  100 mg Per Tube BID   famotidine  20 mg Per Tube Daily   feeding supplement (PROSource TF)  45 mL Per Tube TID   feeding supplement (VITAL HIGH PROTEIN)  1,000 mL Per Tube Q24H   hydrocerin   Topical BID   hydrocortisone sodium succinate  50 mg Intravenous Q12H   insulin aspart  0-15 Units Subcutaneous Q4H   mouth rinse  15 mL Mouth Rinse 10 times per day   OLANZapine  5 mg Per Tube QHS   polyethylene glycol  17 g Per Tube Daily    BMET    Component Value Date/Time   NA 135 12/14/2020 0400   K 4.1 12/14/2020 0400   CL 104 12/14/2020 0400   CO2 24 12/14/2020 0400   GLUCOSE 311 (H) 12/14/2020 0400   BUN 33 (H) 12/14/2020 0400   CREATININE 1.49 (H) 12/14/2020 0400   CREATININE 1.83 (H) 11/19/2020 0914   CALCIUM 7.2 (L) 12/14/2020 0400   GFRNONAA 52 (L) 12/14/2020 0400   GFRNONAA 41 (L) 11/19/2020 0914   GFRAA >60 04/01/2017 1921   CBC    Component Value Date/Time   WBC 15.6 (H) 12/14/2020 0400   RBC 3.42 (L) 12/14/2020 0400   HGB 9.6 (L) 12/14/2020 0400   HGB 11.2 (L) 11/19/2020 0914   HCT 29.7 (L) 12/14/2020 0400   PLT 104 (L)  12/14/2020 0400   PLT 212 11/19/2020 0914   MCV 86.8  12/14/2020 0400   MCH 28.1 12/14/2020 0400   MCHC 32.3 12/14/2020 0400   RDW 24.1 (H) 12/14/2020 0400   LYMPHSABS 0.9 12/12/2020 0432   MONOABS 0.9 12/12/2020 0432   EOSABS 0.0 12/12/2020 0432   BASOSABS 0.1 12/12/2020 0432    Assessment/Plan:   AKI/CKD stage IIIb - presumably ischemic ATN in setting of severe sepsis and MSOF and remains oliguric.  CRRT initiated 7/31.  Running well and UF goal 50-100 ml/hr.  Off pressors and able to UF 1.2 liters overnight but remains markedly volume overloaded. CRRT orders:  4K/2.5 pre-filter at 400 ml/hr, 4K/2.5 post filter at 200 ml/hr, 4K/2.5Ca Dialysate at 1800 ml/hr Uf goal 50-100 ml/hr Heparin stopped 12/12/20 after diagnosed with RLE DVT and started on argatroban Left femoral HD catheter placed 12/07/20 Anasarca - still with marked volume overload as he had to run positive due to hypotension earlier on.  Goal uf 50-100 ml/hr and would use pressors if needed given anasarca. Given significant volume overload and large IVF requirements will continue with CRRT for now and once extubated may consider transition to IHD/transfer to [facility]. Acute hypoxic respiratory failure/VDRF - aspiration pneumonia.  On maxepime/vanc per PCCM.  Started on precedex gtt per PCCM Right leg DVT - now on argatroban. Thrombocytopenia - presumably due to critical illness.  Hematology following and HITT panel pending.  Heparin stopped 12/12/20 after starting argatroban for RLE DVT.  Platelets improved slightly to 94.   Metabolic acidosis - nongap.  Currently on CRRT Atrial fibrillation -on amiodarone and rate controlled. Anemia of critical illness- transfuse prn. Acute metabolic encephalopathy - on fent gtt, zygpreza, and changing to precedex gtt. Hypophosphatemia - improved after IV repletion.  Continue to follow.   Donetta Potts, MD [employer] [phone]

## 2020-12-14 NOTE — Progress Notes (Signed)
Pharmacy: Re- Argatroban  Patient's a 65 y.o M with acute RLE DVT currently on argatroban for r/o HITT.    - on CRRT - HIT antb: 0.144 (0 - 0.400) - SRA: pending   - aPTT is subherapeutic at 48 sec with argatroban drip @ 0.6 mcg/kg/min - no complications of therapy noted by RN  Goal of Therapy:  aPTT 50-90 seconds Monitor platelets by anticoagulation protocol: Yes   Plan: - increase argatroban rate to 0.72 mcg/kg/min - check aPTT in 4 hr after rate increase   Leone Haven, PharmD 12/14/2020 11:51 PM

## 2020-12-15 ENCOUNTER — Inpatient Hospital Stay (HOSPITAL_COMMUNITY): Payer: Medicare HMO

## 2020-12-15 DIAGNOSIS — E1100 Type 2 diabetes mellitus with hyperosmolarity without nonketotic hyperglycemic-hyperosmolar coma (NKHHC): Secondary | ICD-10-CM | POA: Diagnosis not present

## 2020-12-15 LAB — RENAL FUNCTION PANEL
Albumin: 1.9 g/dL — ABNORMAL LOW (ref 3.5–5.0)
Albumin: 1.9 g/dL — ABNORMAL LOW (ref 3.5–5.0)
Anion gap: 10 (ref 5–15)
Anion gap: 10 (ref 5–15)
BUN: 33 mg/dL — ABNORMAL HIGH (ref 8–23)
BUN: 37 mg/dL — ABNORMAL HIGH (ref 8–23)
CO2: 25 mmol/L (ref 22–32)
CO2: 25 mmol/L (ref 22–32)
Calcium: 7.5 mg/dL — ABNORMAL LOW (ref 8.9–10.3)
Calcium: 7.1 mg/dL — ABNORMAL LOW (ref 8.9–10.3)
Chloride: 102 mmol/L (ref 98–111)
Chloride: 103 mmol/L (ref 98–111)
Creatinine, Ser: 1.44 mg/dL — ABNORMAL HIGH (ref 0.61–1.24)
Creatinine, Ser: 1.61 mg/dL — ABNORMAL HIGH (ref 0.61–1.24)
GFR, Estimated: 54 mL/min — ABNORMAL LOW (ref >60)
GFR, Estimated: 47 mL/min — ABNORMAL LOW (ref >60)
Glucose, Bld: 301 mg/dL — ABNORMAL HIGH (ref 70–99)
Glucose, Bld: 234 mg/dL — ABNORMAL HIGH (ref 70–99)
Phosphorus: 3.4 mg/dL (ref 2.5–4.6)
Phosphorus: 3.1 mg/dL (ref 2.5–4.6)
Potassium: 4.5 mmol/L (ref 3.5–5.1)
Potassium: 4.5 mmol/L (ref 3.5–5.1)
Sodium: 137 mmol/L (ref 135–145)
Sodium: 138 mmol/L (ref 135–145)

## 2020-12-15 LAB — POCT I-STAT 7, (LYTES, BLD GAS, ICA,H+H)
Acid-Base Excess: 3 mmol/L — ABNORMAL HIGH (ref 0.0–2.0)
Bicarbonate: 24.6 mmol/L (ref 20.0–28.0)
Calcium, Ion: 1.06 mmol/L — ABNORMAL LOW (ref 1.15–1.40)
HCT: 48 % (ref 39.0–52.0)
Hemoglobin: 16.3 g/dL (ref 13.0–17.0)
O2 Saturation: 83 %
Patient temperature: 98.6
Potassium: 4.8 mmol/L (ref 3.5–5.1)
Sodium: 137 mmol/L (ref 135–145)
TCO2: 25 mmol/L (ref 22–32)
pCO2 arterial: 29.2 mmHg — ABNORMAL LOW (ref 32.0–48.0)
pH, Arterial: 7.534 — ABNORMAL HIGH (ref 7.350–7.450)
pO2, Arterial: 41 mmHg — ABNORMAL LOW (ref 83.0–108.0)

## 2020-12-15 LAB — BLOOD GAS, VENOUS
Acid-Base Excess: 2.7 mmol/L — ABNORMAL HIGH (ref 0.0–2.0)
Bicarbonate: 25.5 mmol/L (ref 20.0–28.0)
O2 Saturation: 61.1 %
Patient temperature: 98.6
pCO2, Ven: 33.5 mmHg — ABNORMAL LOW (ref 44.0–60.0)
pH, Ven: 7.494 — ABNORMAL HIGH (ref 7.250–7.430)
pO2, Ven: 34.9 mmHg (ref 32.0–45.0)

## 2020-12-15 LAB — APTT
aPTT: 49 seconds — ABNORMAL HIGH (ref 24–36)
aPTT: 58 seconds — ABNORMAL HIGH (ref 24–36)
aPTT: 63 seconds — ABNORMAL HIGH (ref 24–36)

## 2020-12-15 LAB — CBC
HCT: 28.9 % — ABNORMAL LOW (ref 39.0–52.0)
Hemoglobin: 9.3 g/dL — ABNORMAL LOW (ref 13.0–17.0)
MCH: 28 pg (ref 26.0–34.0)
MCHC: 32.2 g/dL (ref 30.0–36.0)
MCV: 87 fL (ref 80.0–100.0)
Platelets: 112 10*3/uL — ABNORMAL LOW (ref 150–400)
RBC: 3.32 MIL/uL — ABNORMAL LOW (ref 4.22–5.81)
RDW: 24.3 % — ABNORMAL HIGH (ref 11.5–15.5)
WBC: 16.6 10*3/uL — ABNORMAL HIGH (ref 4.0–10.5)
nRBC: 0.7 % — ABNORMAL HIGH (ref 0.0–0.2)

## 2020-12-15 LAB — GLUCOSE, CAPILLARY
Glucose-Capillary: 222 mg/dL — ABNORMAL HIGH (ref 70–99)
Glucose-Capillary: 234 mg/dL — ABNORMAL HIGH (ref 70–99)
Glucose-Capillary: 238 mg/dL — ABNORMAL HIGH (ref 70–99)
Glucose-Capillary: 242 mg/dL — ABNORMAL HIGH (ref 70–99)
Glucose-Capillary: 328 mg/dL — ABNORMAL HIGH (ref 70–99)
Glucose-Capillary: 363 mg/dL — ABNORMAL HIGH (ref 70–99)

## 2020-12-15 LAB — MAGNESIUM: Magnesium: 2.3 mg/dL (ref 1.7–2.4)

## 2020-12-15 MED ORDER — ETOMIDATE 2 MG/ML IV SOLN
INTRAVENOUS | Status: AC
  Administered 2020-12-15: 10 mg via INTRAVENOUS
  Filled 2020-12-15: qty 20

## 2020-12-15 MED ORDER — MIDAZOLAM HCL 2 MG/2ML IJ SOLN: 1.0000 mg | Freq: Once | INTRAMUSCULAR | Status: AC

## 2020-12-15 MED ORDER — MIDAZOLAM HCL 2 MG/2ML IJ SOLN
INTRAMUSCULAR | Status: AC
  Administered 2020-12-15: 1 mg via INTRAVENOUS
  Filled 2020-12-15: qty 2

## 2020-12-15 MED ORDER — FENTANYL CITRATE (PF) 100 MCG/2ML IJ SOLN: 50.0000 ug | Freq: Once | INTRAMUSCULAR | Status: AC

## 2020-12-15 MED ORDER — ARGATROBAN 50 MG/50ML IV SOLN
0.8700 ug/kg/min | INTRAVENOUS | Status: AC
  Administered 2020-12-15 – 2020-12-16 (×4): 0.87 ug/kg/min via INTRAVENOUS
  Filled 2020-12-15 (×4): qty 50

## 2020-12-15 MED ORDER — SODIUM PHOSPHATES 45 MMOLE/15ML IV SOLN
30.0000 mmol | Freq: Once | INTRAVENOUS | Status: AC
  Administered 2020-12-15: 30 mmol via INTRAVENOUS
  Filled 2020-12-15: qty 10

## 2020-12-15 MED ORDER — PHENYLEPHRINE 40 MCG/ML (10ML) SYRINGE FOR IV PUSH (FOR BLOOD PRESSURE SUPPORT)
PREFILLED_SYRINGE | INTRAVENOUS | Status: AC
  Filled 2020-12-15: qty 10

## 2020-12-15 MED ORDER — FENTANYL CITRATE (PF) 100 MCG/2ML IJ SOLN
INTRAMUSCULAR | Status: AC
  Administered 2020-12-15: 50 ug via INTRAVENOUS
  Filled 2020-12-15: qty 2

## 2020-12-15 MED ORDER — SODIUM CHLORIDE 0.9 % IV SOLN
500.0000 mL | Freq: Once | INTRAVENOUS | Status: AC
  Administered 2020-12-15: 500 mL via INTRAVENOUS

## 2020-12-15 MED ORDER — ETOMIDATE 2 MG/ML IV SOLN: 10.0000 mg | Freq: Once | INTRAVENOUS | Status: AC

## 2020-12-15 MED ORDER — ROCURONIUM BROMIDE 10 MG/ML (PF) SYRINGE
PREFILLED_SYRINGE | INTRAVENOUS | Status: AC
  Filled 2020-12-15: qty 10

## 2020-12-15 NOTE — Progress Notes (Signed)
Progress Note     Subjective: Patient agitated on the vent. Patient does nod yes to having some abdominal pain. Having colostomy output. TF running at 50 cc/h. Possible plans for extubation today.   Objective: Vital signs in last 24 hours: Temp:  [98.8 F (37.1 C)-100.1 F (37.8 C)] 100.1 F (37.8 C) (08/08 0800) Pulse Rate:  [73-86] 82 (08/08 0900) Resp:  [12-28] 19 (08/08 0900) BP: (102-137)/(58-80) 121/67 (08/08 0900) SpO2:  [93 %-100 %] 100 % (08/08 0900) FiO2 (%):  [40 %] 40 % (08/08 0754) Weight:  [106.6 kg] 106.6 kg (08/08 0521) Last BM Date: 12/15/20  Intake/Output from previous day: 08/07 0701 - 08/08 0700 In: 2709.7 [I.V.:804.8; NG/GT:1310; IV Piggyback:561.8] Out: 4319 [Stool:432] Intake/Output this shift: Total I/O In: 156.1 [I.V.:56.1; NG/GT:100] Out: 564 [Other:424; Stool:140]  PE: General: WD, obese male, who is intubated and sedated Lungs: vent  Abd: soft, ND, midline surgical scar well healed, stoma pink and with soft stool output, OGT in place with TFs running   Lab Results:  Recent Labs    12/14/20 0400 12/15/20 0409  WBC 15.6* 16.6*  HGB 9.6* 9.3*  HCT 29.7* 28.9*  PLT 104* 112*   BMET Recent Labs    12/14/20 2109 12/15/20 0409  NA 137 137  K 3.9 4.5  CL 103 102  CO2 26 25  GLUCOSE 283* 301*  BUN 33* 33*  CREATININE 1.61* 1.44*  CALCIUM 7.4* 7.5*   PT/INR No results for input(s): LABPROT, INR in the last 72 hours. CMP     Component Value Date/Time   NA 137 12/15/2020 0409   K 4.5 12/15/2020 0409   CL 102 12/15/2020 0409   CO2 25 12/15/2020 0409   GLUCOSE 301 (H) 12/15/2020 0409   BUN 33 (H) 12/15/2020 0409   CREATININE 1.44 (H) 12/15/2020 0409   CREATININE 1.83 (H) 11/19/2020 0914   CALCIUM 7.5 (L) 12/15/2020 0409   PROT 5.4 (L) 12/09/2020 0916   ALBUMIN 1.9 (L) 12/15/2020 0409   AST 46 (H) 12/09/2020 0916   AST 12 (L) 11/19/2020 0914   ALT 23 12/09/2020 0916   ALT 19 11/19/2020 0914   ALKPHOS 74 12/09/2020 0916    BILITOT 2.0 (H) 12/09/2020 0916   BILITOT 0.7 11/19/2020 0914   GFRNONAA 54 (L) 12/15/2020 0409   GFRNONAA 41 (L) 11/19/2020 0914   GFRAA >60 04/01/2017 1921   Lipase     Component Value Date/Time   LIPASE 24 12/01/2020 0018       Studies/Results: DG Chest Port 1 View  Result Date: 12/15/2020 CLINICAL DATA:  Acute respiratory failure with hypoxemia. EXAM: PORTABLE CHEST 1 VIEW COMPARISON:  12/12/2020 FINDINGS: Endotracheal tube is 4.2 cm above the carina. Right jugular central line tip is at the superior cavoatrial junction. No focal lung disease. Negative for a pneumothorax. Heart size is normal. Nasogastric tube extends into the abdomen and coiled in the proximal stomach region. IMPRESSION: 1. Endotracheal tube is appropriately positioned. 2. No focal lung disease. Electronically Signed   By: Markus Daft M.D.   On: 12/15/2020 07:33    Anti-infectives: Anti-infectives (From admission, onward)    Start     Dose/Rate Route Frequency Ordered Stop   12/09/20 1800  vancomycin (VANCOREADY) IVPB 1750 mg/350 mL  Status:  Discontinued        1,750 mg 175 mL/hr over 120 Minutes Intravenous Every 24 hours 12/08/20 1357 12/09/20 1403   12/09/20 1700  anidulafungin (ERAXIS) 100 mg in sodium chloride 0.9 %  100 mL IVPB  Status:  Discontinued        100 mg 78 mL/hr over 100 Minutes Intravenous Every 24 hours 12/08/20 1359 12/09/20 1321   12/08/20 1500  vancomycin (VANCOCIN) 2,500 mg in sodium chloride 0.9 % 500 mL IVPB        2,500 mg 250 mL/hr over 120 Minutes Intravenous  Once 12/08/20 1357 12/08/20 1719   12/08/20 1500  anidulafungin (ERAXIS) 200 mg in sodium chloride 0.9 % 200 mL IVPB        200 mg 78 mL/hr over 200 Minutes Intravenous  Once 12/08/20 1359 12/08/20 1821   12/08/20 0000  ceFEPIme (MAXIPIME) 2 g in sodium chloride 0.9 % 100 mL IVPB  Status:  Discontinued        2 g 200 mL/hr over 30 Minutes Intravenous Every 12 hours 12/07/20 1200 12/14/20 0858   12/07/20 1000  metroNIDAZOLE  (FLAGYL) IVPB 500 mg  Status:  Discontinued        500 mg 100 mL/hr over 60 Minutes Intravenous Every 12 hours 12/07/20 0849 12/14/20 0858   12/07/20 0900  ceFEPIme (MAXIPIME) 2 g in sodium chloride 0.9 % 100 mL IVPB  Status:  Discontinued        2 g 200 mL/hr over 30 Minutes Intravenous Every 24 hours 12/07/20 0809 12/07/20 1200   12/07/20 0830  metroNIDAZOLE (FLAGYL) IVPB 500 mg  Status:  Discontinued        500 mg 100 mL/hr over 60 Minutes Intravenous Every 8 hours 12/07/20 0758 12/07/20 0849   12/06/20 0900  Ampicillin-Sulbactam (UNASYN) 3 g in sodium chloride 0.9 % 100 mL IVPB  Status:  Discontinued        3 g 200 mL/hr over 30 Minutes Intravenous Every 12 hours 12/06/20 0832 12/07/20 0757   12/03/20 0800  ceFEPIme (MAXIPIME) 2 g in sodium chloride 0.9 % 100 mL IVPB  Status:  Discontinued        2 g 200 mL/hr over 30 Minutes Intravenous Every 8 hours 12/03/20 0746 12/04/20 1004   12/01/20 1000  ceFEPIme (MAXIPIME) 2 g in sodium chloride 0.9 % 100 mL IVPB  Status:  Discontinued        2 g 200 mL/hr over 30 Minutes Intravenous Every 12 hours 12/01/20 0513 12/03/20 0746   12/01/20 0300  vancomycin (VANCOREADY) IVPB 2000 mg/400 mL        2,000 mg 200 mL/hr over 120 Minutes Intravenous  Once 12/01/20 0255 12/01/20 0528   11/30/20 2245  ceFEPIme (MAXIPIME) 2 g in sodium chloride 0.9 % 100 mL IVPB        2 g 200 mL/hr over 30 Minutes Intravenous STAT 11/30/20 2237 12/01/20 0003        Assessment/Plan Stage IIIb colon cancer S/p open partial colectomy with transverse colostomy 09/10/20 Dr. Bobbye Morton Colitis - POD96 - CT AP 8/1 with nonspecific colitis in both proximal colon and excluded colon - ischemic vs infectious vs inflammatory - WBC 16.6  - ostomy output down to 430cc - seems to be tolerating TF at 50cc/h although reports some abdominal pain - ok to start CLD and ADAT if extubated, ok to advance TF to goal if not extubated - if more abdominal pain or unable to tolerate  advancement in diet would recommend repeat CT scan - no acute surgical indications, general surgery will sign off at this time but are available as needed with questions or concerns   FEN: NPO, OGT for TFs VTE: SQH, SCDs ID: [email or web address]  7/31>>, vanc/eraxis 8/1>>   - below per primary team - Sepsis vs reaction to chemotherapy with shock - pressor requirement decreased Acute hypoxic respiratory failure/fungal PNA - vent per PCCM, resp cxs with yeast AKI on CKD stage II - on CRRT A fib with RVR - on amio gtt Hx of MCA CVA Hx of asthma Hx of epilepsy on Keppra T2DM GERD HTN HLD OSA Obesity class II - BMI 39.76  LOS: 14 days    Norm Parcel, [facility] Surgery 12/15/2020, 9:34 AM Please see Amion for pager number during day hours 7:00am-4:30pm

## 2020-12-15 NOTE — Progress Notes (Signed)
CPT held at this time due to patient agitation.

## 2020-12-15 NOTE — Progress Notes (Signed)
Patient extubated this morning  Having some tachypnea-in the 30s Some difficulty clearing secretions  Will try BiPAP and monitor closely  If unable to protect airway well may need to be reintubated  Will continue to follow closely

## 2020-12-15 NOTE — Progress Notes (Signed)
NAME:  Thomas [name], MRN:  [id number], DOB:  1955/05/11, LOS: 2 ADMISSION DATE:  11/30/2020, CONSULTATION DATE:  7/30 REFERRING MD:  Riccardo Dubin, CHIEF COMPLAINT:  Worsening hypoxia/dyspnea  History of Present Illness:  65 yo M admitted for worsening sepsis vs reaction to chemotherapy with high ostomy output and soft BP on 7/25. Has had issues during his hospitalization with recurrent aspirations and more recently worsening respiratory status.  Most recently pt developed worsening hypoxia and was requiring BIPAP w/ FIO2 of 100% and increased work of breathing. Unable to obtain ABG, CXR in AM of 7/30 showed some worsening concern of aspiration.   Who was admitted to intensive care and subsequently had worsening clinical status requiring intubation, CRRT, multiple pressors and pneumonia along with CT findings 8/1 concerning for ischemic bowel.  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  Hx of remote MCA stroke Asthma hx as a child Epilepsy on keppra GERD T2DM on metformin as outpt HTN HLD OSA  Significant Hospital Events:  7/27 Transferred to [facility], stopped abx as sepsis resolved/ruled out 7/30 AM Worsened AME, more hypoxic with worsening renal funciton, new aspiration on abx restarted 7/30 PM Pt with increased o2 requirement on 100% FIO2 on BIPAP, d/w family who wanted to proceed with intubation, pt intubated 8/1 Some issues with tachycardia and hypotension overnight. Reproted high ostomy output  8/2 down to 9 mcg of Levophed, however repeat CT abdomen/pelvis showed inflammatory wall thickening and fat stranding of the descending and sigmoid colon, new since prior, consistent with nonspecific infectious, inflammatory, or ischemic colitis.  Also new dense consolidative airspace opacity in the right lower and right middle lobes 8/3  further weaning NE. Weaning sedation. Full code, full scope per family meeting 8/2  8/4 off pressors, plt down to 103.  8/5 remains off pressors. Moving around more, not really following commands though   Micro: Resp viral panel 7/24 > neg covid/ flu [facility]  7/24  x 2 neg  MRSA  7/25 neg  UC   7/25  neg  C diff screeen 7/26  neg ET culture 7/31 > moderate yeast> grew mod candida tropicalis GI panel  8/1> neg   Abx: Unasyn 7/30 >>7/31 Maxepime 7/31 >> 8/7 Flagyl 7/31 >>  8/7 Vancomycin 8/1 only  Anidulafungin 8/1>>8/3    Scheduled Meds:  albuterol  2.5 mg Nebulization QID   chlorhexidine gluconate (MEDLINE KIT)  15 mL Mouth Rinse BID   Chlorhexidine Gluconate Cloth  6 each Topical Daily   clonazepam  1 mg Per Tube TID   cloNIDine  0.1 mg Per Tube TID   docusate  100 mg Per Tube BID   famotidine  20 mg Per Tube Daily   feeding supplement (PROSource TF)  45 mL Per Tube TID   feeding supplement (VITAL HIGH PROTEIN)  1,000 mL Per Tube Q24H   hydrocerin   Topical BID   hydrocortisone sodium succinate  50 mg Intravenous Q12H   insulin aspart  0-15 Units Subcutaneous Q4H   mouth rinse  15 mL Mouth Rinse 10 times per day   OLANZapine  5 mg Per Tube QHS   polyethylene glycol  17 g Per Tube Daily   Continuous Infusions:   prismasol BGK 4/2.5 400 mL/hr at 12/14/20 1435    prismasol BGK 4/2.5 200 mL/hr at 12/15/20 0549   sodium chloride 10 mL/hr at  12/15/20 0800   sodium chloride Stopped (12/12/20 1729)   anticoagulant sodium citrate     argatroban 0.87 mcg/kg/min (12/15/20 0800)   dexmedetomidine (PRECEDEX) IV infusion 0.3 mcg/kg/hr (12/15/20 0800)   levETIRAcetam Stopped (12/14/20 2131)   prismasol BGK 4/2.5 1,800 mL/hr at 12/15/20 0546   promethazine (PHENERGAN) injection (IM or IVPB)     thiamine injection Stopped (12/14/20 2203)   PRN Meds:.sodium chloride, sodium chloride, acetaminophen, alteplase, anticoagulant sodium citrate, dextrose, docusate, fentaNYL (SUBLIMAZE)  injection, methocarbamol, promethazine (PHENERGAN) injection (IM or IVPB), sodium chloride, sodium chloride flush    Interim History / Subjective:  Still somnolent Tolerating ventilator changes-weaning No overnight events  Objective   Blood pressure 130/67, pulse 80, temperature 100.1 F (37.8 C), temperature source Axillary, resp. rate (!) 26, height 5' 8"  (1.727 m), weight 106.6 kg, SpO2 96 %.    Vent Mode: PSV;CPAP FiO2 (%):  [40 %] 40 % Set Rate:  [26 bmp] 26 bmp Vt Set:  [600 mL] 600 mL PEEP:  [5 cmH20] 5 cmH20 Pressure Support:  [10 cmH20] 10 cmH20 Plateau Pressure:  [17 cmH20-19 cmH20] 18 cmH20   Intake/Output Summary (Last 24 hours) at 12/15/2020 0810 Last data filed at 12/15/2020 0800 Gross per 24 hour  Intake 2667.97 ml  Output 4542 ml  Net -1874.03 ml   Filed Weights   12/13/20 0500 12/14/20 0500 12/15/20 0521  Weight: 111.5 kg 110.9 kg 106.6 kg    Tmax T-max 100.1 General appearance:   Somnolent No jvd Endotracheal tube in place, OG tube in place Neck is supple Rhonchi bilaterally  S1-S2 appreciated Bowel sounds appreciated 1+ edema Sedated  Resolved Hospital Problem list   Shock  HHS Afib RVR  Lactic Acidosis    Assessment & Plan:   Altered mental status Acute metabolic encephalopathy History of left CVA in 2015 History of seizure disorder -Continue Keppra -Continue to wean sedation as tolerated  Acute hypoxic respiratory failure Aspiration pneumonia -VAP, pulmonary hygiene -Weaning as tolerated  Aspiration pneumonia Colitis -Completed antibiotics Flagyl and cefepime on 8 7 -Continue to monitor ostomy output -Remains on tube feeds  AKI on chronic kidney disease 380 On CRRT -CRRT per nephrology  Stage IIIb transverse colon cancer S/p partial transverse colectomy, end colectomy On CAPOX-s/p 3 cycles 6/1, 6/22, 7/13 -Follows with Dr. Ammie Dalton  Heart failure with preserved ejection fraction -Last EF 60 to 65% -Continue  monitoring  Thrombocytopenia -Secondary to chemo and critical illness -Continue to monitor  History of hypertension History of hyperlipidemia -Home medications include Norvasc, Lipitor -Clonidine 0.1 3 times daily  Type 2 diabetes HHS-improved -Continue SSI  Capecitabine induced desquamation Hand/foot syndrome -Continue moisturizer to hands and feet and scrotum  Goals of care discussion 8/2 -Continue full scope of care  Best Practice  Diet/type: tf per nutrition DVT prophylaxis: SCD/ argatroban GI prophylaxis: H2B Lines: Port, femoral trialysis,  Foley:  N/A Code Status:  full code Last date of goals of care discussion : 8/2 : PCCM Palli, RN, family -- full code full scope   The patient is critically ill with multiple organ systems failure and requires high complexity decision making for assessment and support, frequent evaluation and titration of therapies, application of advanced monitoring technologies and extensive interpretation of multiple databases. Critical Care Time devoted to patient care services described in this note independent of APP/resident time (if applicable)  is 35 minutes.   Sherrilyn Rist MD [facility] Personal pager: See Amion If unanswered, please page CCM On-call: [phone]

## 2020-12-15 NOTE — Progress Notes (Signed)
[facility] for Argartroban Indication: DVT, under investigation for HIIT  No Known Allergies  Patient Measurements: Height: 5\' 8"  (172.7 cm) Weight: 106.6 kg (235 lb 0.2 oz) IBW/kg (Calculated) : 68.4    Vital Signs: Temp: 99 F (37.2 C) (08/08 0400) Temp Source: Axillary (08/08 0400) BP: 111/67 (08/08 0600) Pulse Rate: 79 (08/08 0600)  Labs: Recent Labs    12/13/20 0312 12/13/20 0930 12/14/20 0400 12/14/20 0700 12/14/20 1745 12/14/20 2109 12/14/20 2213 12/15/20 0409  HGB 9.6*  --  9.6*  --   --   --   --  9.3*  HCT 29.6*  --  29.7*  --   --   --   --  28.9*  PLT 94*  --  104*  --   --   --   --  112*  APTT 45*   < >  --    < > 51*  --  48* 49*  CREATININE 1.34*   < > 1.49*  --   --  1.61*  --  1.44*   < > = values in this interval not displayed.    Estimated Creatinine Clearance: 61.4 mL/min (A) (by C-G formula based on SCr of 1.44 mg/dL (H)).  Assessment: 23 yoM, intubated, on CRRT, with platelets decreasing. Onc doubts HITT, likely due to chemo/sepsis, but HIIT panel ordered,LE dopplers ordered > RLE DVT, need to use direct thrombin inhibitor - best choice Argatroban  HIT antb: 0.144 (0-0.400) SRA: pending  12/15/2020: aPTT=49sec; subatherapeutic on Argatroban 0.73mcg/kg/min No infusion related issues; no complications of therapy noted per RN CBC:  Hg 9.3, pltc 112 - low but improving   Goal of Therapy:  aPTT 50-90 seconds Monitor platelets by anticoagulation protocol: Yes   Plan:  Increase Argatroban to 0.87 mcg/kg/min Recheck aPTT in 4 hrs after rate change (goal is two aPTT levels in range, then can check aPTT q12-24h) Daily CBC  Leone Haven, PharmD 12/15/2020,6:21 AM

## 2020-12-15 NOTE — Procedures (Signed)
Extubation Procedure Note  Patient Details:   Name: Thomas [name] DOB: 1955/06/28 MRN: [id number]   Airway Documentation:    Vent end date: 12/15/20 Vent end time: 1111   Evaluation  O2 sats: stable throughout Complications: No apparent complications Patient did tolerate procedure well. Bilateral Breath Sounds: Expiratory wheezes, Diminished (Simultaneous filing. User may not have seen previous data.)   Yes  Pt extubated to 4L [place] per CCMD order. Pt tolerating well at this time with saturations of 100% (will wean O2 down as tolerated). Pt was suctioned and had a positive cuff leak prior to extubation. No stridor heard at this time, vitals are stable. RT will continue to monitor pt status.  Melven Stockard A Ules Marsala 12/15/2020, 11:12 AM

## 2020-12-15 NOTE — TOC Progression Note (Signed)
Transition of Care [facility]) - Progression Note    Patient Details  Name: Thomas [name] MRN: [id number] Date of Birth: Oct 22, 1955  [facility] CM/SW Contact  Leeroy Cha, RN Phone Number: 12/15/2020, 9:32 AM  Clinical Narrative:    Significant Hospital Events:  7/27 Transferred to [facility], stopped abx as sepsis resolved/ruled out 7/30 AM Worsened AME, more hypoxic with worsening renal funciton, new aspiration on abx restarted 7/30 PM Pt with increased o2 requirement on 100% FIO2 on BIPAP, d/w family who wanted to proceed with intubation, pt intubated 8/1 Some issues with tachycardia and hypotension overnight. Reproted high ostomy output 8/2 down to 9 mcg of Levophed, however repeat CT abdomen/pelvis showed inflammatory wall thickening and fat stranding of the descending and sigmoid colon, new since prior, consistent with nonspecific infectious, inflammatory, or ischemic colitis.  Also new dense consolidative airspace opacity in the right lower and right middle lobes 8/3 further weaning NE. Weaning sedation. Full code, full scope per family meeting 8/2 8/4 off pressors, plt down to 103. 8/5 remains off pressors. Moving around more, not really following commands though TOC PLAN OF CARE: remains on the vent, following for progression and toc needs will mostly need placement.   Expected Discharge Plan: Home/Self Care Barriers to Discharge: Continued Medical Work up  Expected Discharge Plan and Services Expected Discharge Plan: Home/Self Care   Discharge Planning Services: CM Consult   Living arrangements for the past 2 months: Single Family Home                                       Social Determinants of Health (SDOH) Interventions    Readmission Risk Interventions No flowsheet data found.

## 2020-12-15 NOTE — Progress Notes (Signed)
ANTICOAGULATION CONSULT NOTE - Follow Up Consult  Pharmacy Consult for Argatroban Indication: DVT and rule out HIT  No Known Allergies  Patient Measurements: Height: 5\' 8"  (172.7 cm) Weight: 106.6 kg (235 lb 0.2 oz) IBW/kg (Calculated) : 68.4 Heparin Dosing Weight:   Vital Signs: Temp: 100.3 F (37.9 C) (08/08 1140) Temp Source: Axillary (08/08 1140) BP: 91/51 (08/08 1500) Pulse Rate: 79 (08/08 1500)  Labs: Recent Labs    12/13/20 0312 12/13/20 0930 12/14/20 0400 12/14/20 0700 12/14/20 2109 12/14/20 2213 12/15/20 0409 12/15/20 1100 12/15/20 1437 12/15/20 1528  HGB 9.6*  --  9.6*  --   --   --  9.3*  --  16.3  --   HCT 29.6*  --  29.7*  --   --   --  28.9*  --  48.0  --   PLT 94*  --  104*  --   --   --  112*  --   --   --   APTT 45*   < >  --    < >  --    < > 49* 58*  --  63*  CREATININE 1.34*   < > 1.49*  --  1.61*  --  1.44*  --   --  1.61*   < > = values in this interval not displayed.     Estimated Creatinine Clearance: 54.9 mL/min (A) (by C-G formula based on SCr of 1.61 mg/dL (H)).   Medications:  Infusions:    prismasol BGK 4/2.5 400 mL/hr at 12/15/20 1557    prismasol BGK 4/2.5 200 mL/hr at 12/15/20 0549   sodium chloride 10 mL/hr at 12/15/20 1500   sodium chloride Stopped (12/12/20 1729)   anticoagulant sodium citrate     argatroban 0.87 mcg/kg/min (12/15/20 1500)   dexmedetomidine (PRECEDEX) IV infusion 0.4 mcg/kg/hr (12/15/20 1500)   levETIRAcetam Stopped (12/15/20 1028)   prismasol BGK 4/2.5 1,800 mL/hr at 12/15/20 1432   promethazine (PHENERGAN) injection (IM or IVPB)     thiamine injection Stopped (12/15/20 0933)    Assessment: 62 yom admitted on 7/24 with high ostomy output, sepsis vs chemotherapy reaction; developed respiratory failure, aspiration, AKI, ischemic colitis.  He was initially on prophylactic heparin, then apixaban for Afib, then pharmacy was consulted to dose heparin IV for afib, then stopped for bleeding on 8/2 (also back in  NSR), but remained on heparin in CRRT circuit.  Pharmacy was consulted for Argatroban for DVT on 8/5 due to concern for HITT.    - 8/5 HIT antb: 0.144, negative (0 - 0.400) - 8/5 SRA: pending  Today, 12/15/2020 3:58 PM : - aPTT 63, remains therapeutic on argatroban 0.87 mcg/kg/hr - CBC: Hgb remains low/stable at 9.3, Plt improved to 112 - No bleeding or complications reported. - Extubated today.  Remains on CRRT with no clotting issues reported.  Goal of Therapy:  aPTT 50-90 seconds Monitor platelets by anticoagulation protocol: Yes   Plan:  Continue Argatroban at 0.87 mcg/kg/min IV  F/U 12h aPTT Daily CBC Follow up Onnie Graham  12/15/2020 3:58 PM

## 2020-12-15 NOTE — Progress Notes (Addendum)
HEMATOLOGY-ONCOLOGY PROGRESS NOTE  SUBJECTIVE: Thomas [name] remains on the ventilator.  He has periods of agitation.  They are attempting to wean him off the ventilator and may try and extubate later today.  Oncology History  Colon cancer ([place])  09/23/2020 Initial Diagnosis   Colon cancer ([place])    09/23/2020 Cancer Staging   Staging form: Colon and Rectum, AJCC 8th Edition - Pathologic: Stage IIIB (pT3, pN1a, cM0) - Signed by Ladell Pier, MD on 09/23/2020  Total positive nodes: 1  Histologic grading system: 4 grade system  Histologic grade (G): G1  Residual tumor (R): R0 - None  Lymph-vascular invasion (LVI): LVI not present (absent)/not identified  Tumor deposits (TD): Absent  Perineural invasion (PNI): Absent  Microsatellite instability (MSI): Stable    10/08/2020 -  Chemotherapy    Patient is on Treatment Plan: COLORECTAL XELOX (CAPEOX) Q21D         PHYSICAL EXAMINATION:  Vitals:   12/15/20 0754 12/15/20 0800  BP: 117/69 130/67  Pulse: 79 80  Resp: (!) 28 (!) 26  Temp:  100.1 F (37.8 C)  SpO2: 100% 96%   Filed Weights   12/13/20 0500 12/14/20 0500 12/15/20 0521  Weight: 111.5 kg 110.9 kg 106.6 kg    Intake/Output from previous day: 08/07 0701 - 08/08 0700 In: 2709.7 [I.V.:804.8; NG/GT:1310; IV Piggyback:561.8] Out: 4319 [Stool:432]  GENERAL: intubated, sedated SKIN: Dryness of the palms and soles, erythema with superficial desquamation at the hands and several fingers, superficial skin breakdown at the scrotum-improved, hyperpigmentation and skin thickening at the hands  OROPHARYNX: ETT in place Cardiovascular-distant heart sounds, trace bilateral lower extremity edema ABDOMEN: Right abdomen colostomy with loose, partially formed stool, soft NEURO: Sedated, moves extremities  Port-A-Cath without erythema  LABORATORY DATA:  I have reviewed the data as listed CMP Latest Ref Rng & Units 12/15/2020 12/14/2020 12/14/2020  Glucose 70 - 99 mg/dL 301(H)  283(H) 311(H)  BUN 8 - 23 mg/dL 33(H) 33(H) 33(H)  Creatinine 0.61 - 1.24 mg/dL 1.44(H) 1.61(H) 1.49(H)  Sodium 135 - 145 mmol/L 137 137 135  Potassium 3.5 - 5.1 mmol/L 4.5 3.9 4.1  Chloride 98 - 111 mmol/L 102 103 104  CO2 22 - 32 mmol/L _0 Calcium 8.9 - 10.3 mg/dL 7.5(L) 7.4(L) 7.2(L)  Total Protein 6.5 - 8.1 g/dL - - -  Total Bilirubin 0.3 - 1.2 mg/dL - - -  Alkaline Phos 38 - 126 U/L - - -  AST 15 - 41 U/L - - -  ALT 0 - 44 U/L - - -    Lab Results  Component Value Date   WBC 16.6 (H) 12/15/2020   HGB 9.3 (L) 12/15/2020   HCT 28.9 (L) 12/15/2020   MCV 87.0 12/15/2020   PLT 112 (L) 12/15/2020   NEUTROABS 10.1 (H) 12/12/2020    CT ABDOMEN PELVIS WO CONTRAST  Result Date: 12/08/2020 CLINICAL DATA:  Abdominal distension, decreasing colostomy output, history of colon cancer status post transverse colectomy EXAM: CT ABDOMEN AND PELVIS WITHOUT CONTRAST TECHNIQUE: Multidetector CT imaging of the abdomen and pelvis was performed following the standard protocol without IV contrast. Oral enteric contrast was administered COMPARISON:  CT chest abdomen pelvis, 12/01/2020 FINDINGS: Lower chest: There is new, dense heterogeneous and consolidative airspace opacity of the right lower and right middle lobes (series 7, image 1). Coronary artery calcifications. Aortic valve calcifications Hepatobiliary: No solid liver abnormality is seen. Hepatic steatosis. No gallstones, gallbladder wall thickening, or biliary dilatation. Pancreas: Unremarkable. No pancreatic ductal  dilatation or surrounding inflammatory changes. Spleen: Normal in size without significant abnormality. Adrenals/Urinary Tract: Adrenal glands are unremarkable. Kidneys are normal, without renal calculi, solid lesion, or hydronephrosis. Bladder is unremarkable. Stomach/Bowel: Esophagogastric tube is position with tip and side port below the diaphragm, however folded in the gastric body, with tip in the gastric fundus (series 2, image  17). 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 The proximal colon as well as the excluded colonic remnant are fluid-filled, and there is inflammatory wall thickening and fat stranding about the descending and sigmoid colon (series 2, image 78). Vascular/Lymphatic: Left femoral venous and arterial catheters. Aortic atherosclerosis. Incidental note of retroaortic left renal vein. No enlarged abdominal or pelvic lymph nodes. Reproductive: No mass or other significant abnormality. Other: Mild anasarca. Fat containing left inguinal hernia. No abdominopelvic ascites. Musculoskeletal: No acute or significant osseous findings. IMPRESSION: 1. 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 2. The proximal colon as well as the excluded colonic remnant are fluid-filled, and there is inflammatory wall thickening and fat stranding about the descending and sigmoid colon. Findings are new compared to prior CT dated 12/01/2020, consistent with nonspecific infectious, inflammatory, or ischemic colitis. 3. There is new, dense heterogeneous and consolidative airspace opacity of the right lower and right middle lobes, consistent with infection or aspiration. 4. Esophagogastric tube is positioned with tip and side port below the diaphragm, however folded in the gastric body, with tip in the gastric fundus. Recommend repositioning. 5. Hepatic steatosis. 6. Coronary artery disease. Aortic Atherosclerosis (ICD10-I70.0). Electronically Signed   By: Eddie Candle M.D.   On: 12/08/2020 17:45   DG Chest 1 View  Result Date: 12/06/2020 CLINICAL DATA:  Shortness of breath. Patient undergoing chemotherapy for stage IIIB colon cancer. EXAM: CHEST  1 VIEW COMPARISON:  December 04, 2020 FINDINGS: Platelike opacity in the left base consistent with atelectasis or scar, stable. New opacity in the right base obscuring the elevated right hemidiaphragm. The heart, hila, and mediastinum are unchanged and  unremarkable. The right Port-A-Cath is stable. No pulmonary nodules or masses. No other abnormalities. IMPRESSION: 1. Stable platelike opacity in the left base consistent with scar or atelectasis. 2. New opacity in the right base obscuring the elevated right hemidiaphragm. The opacity could represent atelectasis versus developing infiltrate. Recommend clinical correlation and short-term follow-up imaging to ensure resolution. Electronically Signed   By: Dorise Bullion III M.D   On: 12/06/2020 11:50   DG Abd 1 View  Result Date: 12/06/2020 CLINICAL DATA:  NG tube placement. EXAM: ABDOMEN - 1 VIEW COMPARISON:  CT 12/01/2020 FINDINGS: Tip and side port of the enteric tube below the diaphragm in the stomach. Generalized paucity of bowel gas in the upper abdomen. IMPRESSION: Tip and side port of the enteric tube below the diaphragm in the stomach. Electronically Signed   By: Keith Rake M.D.   On: 12/06/2020 23:37   CT HEAD WO CONTRAST  Result Date: 12/05/2020 CLINICAL DATA:  Acute neuro deficit, agitation and delirium, history of colon carcinoma EXAM: CT HEAD WITHOUT CONTRAST TECHNIQUE: Contiguous axial images were obtained from the base of the skull through the vertex without intravenous contrast. COMPARISON:  09/08/2016 and previous FINDINGS: Brain: Previous left frontal craniotomy. Stable encephalomalacia in left frontal lobe. Negative for acute intracranial hemorrhage, midline shift, no parenchymal edema, mass or mass effect. No hydrocephalus. Mild diffuse parenchymal atrophy as before. Vascular: Atherosclerotic and physiologic intracranial calcifications. Skull: Changes of left temporal craniotomy. No acute fracture or worrisome bone  lesion. Sinuses/Orbits: No acute finding. Other: None IMPRESSION: 1. Negative for bleed or other acute intracranial process. 2. Stable changes of left frontal craniotomy Electronically Signed   By: Lucrezia Europe M.D.   On: 12/05/2020 11:34   US RENAL  Result Date:  12/06/2020 CLINICAL DATA:  AK I EXAM: RENAL / URINARY TRACT ULTRASOUND COMPLETE COMPARISON:  Renal ultrasound 10/02/2020 FINDINGS: Right Kidney: Renal measurements: 9.8 x 6.3 x 5.9 cm = volume: 190 mL. Echogenicity within normal limits. No mass or hydronephrosis visualized. Left Kidney: Renal measurements: 11.9 x 7.2 x 5.8 cm = volume: 262 mL. Echogenicity within normal limits. No hydronephrosis. There is a cyst in the lateral aspect of the kidney measuring 4.5 x 4.0 x 4.0 cm. Bladder: Appears decompressed with Foley in place. Other: None. IMPRESSION: No acute abnormality sonographically in the kidneys. Exam is limited by difficulty with patient positioning and shadowing bowel gas. Electronically Signed   By: Audie Pinto M.D.   On: 12/06/2020 11:47   DG Chest Port 1 View  Result Date: 12/15/2020 CLINICAL DATA:  Acute respiratory failure with hypoxemia. EXAM: PORTABLE CHEST 1 VIEW COMPARISON:  12/12/2020 FINDINGS: Endotracheal tube is 4.2 cm above the carina. Right jugular central line tip is at the superior cavoatrial junction. No focal lung disease. Negative for a pneumothorax. Heart size is normal. Nasogastric tube extends into the abdomen and coiled in the proximal stomach region. IMPRESSION: 1. Endotracheal tube is appropriately positioned. 2. No focal lung disease. Electronically Signed   By: Markus Daft M.D.   On: 12/15/2020 07:33   DG CHEST PORT 1 VIEW  Result Date: 12/12/2020 CLINICAL DATA:  Endotracheal tube position EXAM: PORTABLE CHEST 1 VIEW COMPARISON:  Chest radiograph 12/09/2020 FINDINGS: The endotracheal tube tip is in the midthoracic trachea. A right chest wall port is unchanged. The enteric catheter tip is in the stomach. The cardiomediastinal silhouette is stable. There is no focal consolidation or pulmonary edema. There is no pleural effusion or pneumothorax. The bones are stable. IMPRESSION: Endotracheal tube tip in the midthoracic trachea. Electronically Signed   By: Valetta Mole MD    On: 12/12/2020 08:09   DG CHEST PORT 1 VIEW  Result Date: 12/09/2020 CLINICAL DATA:  65 year old male, evaluate for pneumonia. EXAM: PORTABLE CHEST - 1 VIEW COMPARISON:  12/07/2020 FINDINGS: The patient is rotated to the right, limiting of diagnostic evaluation. Unchanged appearance of right chest wall, internal jugular vein approach Port-A-Cath with the catheter tip in the superior right atrium. Endotracheal tube in place with the tip in the inferior thoracic trachea, just above the carina. Gastric decompression tube courses off the inferior aspect of this image, the tip is visualized in the fundus. The mediastinal contours are obscured, though similar to comparison limits. No cardiomegaly. Hazy opacities in the right lung base, limited evaluation due to projection. No acute osseous abnormality. IMPRESSION: 1. Right lower lobe subsegmental atelectasis versus infiltrate. Limited evaluation due to patient rotation. Recommend attention on follow-up. 2. Support lines and tubes in unchanged, appropriate positions. Electronically Signed   By: Ruthann Cancer MD   On: 12/09/2020 11:15   DG Chest Port 1 View  Result Date: 12/07/2020 CLINICAL DATA:  Oxygen desaturation. EXAM: PORTABLE CHEST 1 VIEW COMPARISON:  Radiograph yesterday.  CT 12/01/2020 FINDINGS: Endotracheal tube tip 2 cm from the carina. Enteric tube tip below the diaphragm not included in the field of view. Right chest port remains in place. Improved aeration of the right lung base with decreased volume loss. Residual patchy basilar  opacity. The heart is normal in size. No pulmonary edema, large pleural effusion or pneumothorax. IMPRESSION: 1. Improved aeration of the right lung base with decreased volume loss since radiograph yesterday. Residual patchy right basilar opacity. 2. Stable support apparatus. Electronically Signed   By: Keith Rake M.D.   On: 12/07/2020 22:12   DG CHEST PORT 1 VIEW  Result Date: 12/06/2020 CLINICAL DATA:  Intubation.  EXAM: PORTABLE CHEST 1 VIEW COMPARISON:  Radiograph earlier today.  CT 12/01/2020 FINDINGS: Endotracheal tube tip 19 mm from the carina. Enteric tube in place with tip and side-port below the diaphragm. Right chest port unchanged in position. There is progressive volume loss in the right hemithorax with opacification of the lower thorax and rightward mediastinal shift. Improved linear opacity at the left lung base. No pneumothorax. IMPRESSION: 1. Endotracheal tube tip 19 mm from the carina. Enteric tube in place. 2. Progressive volume loss in the right hemithorax with opacification of the lower thorax and rightward mediastinal shift. Findings favor lobar collapse/atelectasis. Linear opacity at the left lung base is improved from earlier today. Electronically Signed   By: Keith Rake M.D.   On: 12/06/2020 23:40   DG CHEST PORT 1 VIEW  Result Date: 12/04/2020 CLINICAL DATA:  Vomiting with possible aspiration. EXAM: PORTABLE CHEST 1 VIEW COMPARISON:  November 30, 2020 FINDINGS: There is stable right-sided venous Port-A-Cath positioning. Mild linear atelectasis is seen within the mid left lung. This represents a new finding when compared to the prior exam. There is no evidence of a pleural effusion or pneumothorax. The heart size and mediastinal contours are within normal limits. The visualized skeletal structures are unremarkable. IMPRESSION: Interval development of mild linear atelectasis within the mid left lung since the prior study. Electronically Signed   By: Virgina Norfolk M.D.   On: 12/04/2020 01:19   DG Chest Port 1 View  Result Date: 11/30/2020 CLINICAL DATA:  Questionable sepsis. Patient began treatment for colon cancer 3 months ago. Loss of appetite, severe abdominal pain, weakness, and back pain. EXAM: PORTABLE CHEST 1 VIEW COMPARISON:  11/27/2020 FINDINGS: Heart size and pulmonary vascularity are normal. Lungs clear. Power port type central venous catheter with tip over the low SVC region. No  pneumothorax. No pleural effusions. Mediastinal contours appear intact. IMPRESSION: No active disease. Electronically Signed   By: Lucienne Capers M.D.   On: 11/30/2020 22:36   DG Chest Port 1 View  Result Date: 11/27/2020 CLINICAL DATA:  Weakness.  Fall today. EXAM: PORTABLE CHEST 1 VIEW COMPARISON:  Radiograph 11/15/2020 FINDINGS: Right chest port unchanged in position. Normal heart size and mediastinal contours. No acute or focal airspace disease no pleural fluid, pulmonary edema, or pneumothorax. No acute osseous abnormalities are seen. IMPRESSION: No acute chest findings. Electronically Signed   By: Keith Rake M.D.   On: 11/27/2020 22:09   DG Chest Portable 1 View  Result Date: 11/15/2020 CLINICAL DATA:  Fatigue, colon cancer EXAM: PORTABLE CHEST 1 VIEW COMPARISON:  10/01/2020 FINDINGS: Right Port-A-Cath remains in place, unchanged. Heart and mediastinal contours are within normal limits. No focal opacities or effusions. No acute bony abnormality. IMPRESSION: No active disease. Electronically Signed   By: Rolm Baptise M.D.   On: 11/15/2020 14:34   EEG adult  Result Date: 12/05/2020 Lora Havens, MD     12/05/2020  6:19 PM Patient Name: MARION ROSENBERRY MRN: [id number] Epilepsy Attending: Lora Havens Referring Physician/Provider: Dr Sander Radon Date: 12/05/2020 Duration: 23.18 mins Patient history: 65yo M withh/o epilepsy  presented with ams. EEG to evaluate for seizure Level of alertness: Awake AEDs during EEG study: LEV Technical aspects: This EEG study was done with scalp electrodes positioned according to the 10-20 International system of electrode placement. Electrical activity was acquired at a sampling rate of 500Hz and reviewed with a high frequency filter of 70Hz and a low frequency filter of 1Hz. EEG data were recorded continuously and digitally stored. Description: No posterior dominant rhythm was seen. EEG showed continuous generalized 3 to 6 Hz theta-delta slowing. There is  also 3-5hz sharply contoured theta-delta slowing with overriding 13-15hz beta activity in left frontotemporal region consistent with breach artifact.  Hyperventilation and photic stimulation were not performed.   ABNORMALITY - Continuous slow, generalized - Breach artifact, left frontotemporal region IMPRESSION: This study is suggestive of cortical dysfunction arising from left frontotemporal region likely secondary to underlying encephalomalacia as well as prior craniotomy. There is also moderate diffuse encephalopathy, nonspecific etiology. No seizures or definite epileptiform discharges were seen throughout the recording. Lora Havens   ECHOCARDIOGRAM COMPLETE  Result Date: 12/02/2020    ECHOCARDIOGRAM REPORT   Patient Name:   DAMEL QUERRY Date of Exam: 12/02/2020 Medical Rec #:  [id number]    Height:       68.0 in Accession #:    [id number]   Weight:       246.5 lb Date of Birth:  1955-05-30    BSA:          2.234 m Patient Age:    64 years     BP:           136/77 mmHg Patient Gender: M            HR:           92 bpm. Exam Location:  Inpatient Procedure: 2D Echo, Color Doppler and Cardiac Doppler Indications:    I48.91* Unspecified atrial fibrillation  History:        Patient has no prior history of Echocardiogram examinations.                 Arrythmias:Atrial Fibrillation; Risk Factors:Hypertension,                 Diabetes, Dyslipidemia and Sleep Apnea.  Sonographer:    Raquel Sarna Senior RDCS Referring Phys: [phone] [facility]  Sonographer Comments: Technically difficult due to patient body habitus. IMPRESSIONS  1. Mild intracavitary gradient. Peak velocity 1.37 m/s. Peak gradient 7.5 mmHg. Left ventricular ejection fraction, by estimation, is 60 to 65%. The left ventricle has normal function. The left ventricle has no regional wall motion abnormalities. There is moderate concentric left ventricular hypertrophy. Left ventricular diastolic parameters are consistent with Grade I diastolic dysfunction  (impaired relaxation).  2. Right ventricular systolic function is normal. The right ventricular size is normal. There is normal pulmonary artery systolic pressure.  3. The mitral valve is normal in structure. No evidence of mitral valve regurgitation. No evidence of mitral stenosis.  4. The aortic valve is calcified. There is moderate calcification of the aortic valve. Aortic valve regurgitation is not visualized. No aortic stenosis is present.  5. The inferior vena cava is normal in size with greater than 50% respiratory variability, suggesting right atrial pressure of 3 mmHg. FINDINGS  Left Ventricle: Mild intracavitary gradient. Peak velocity 1.37 m/s. Peak gradient 7.5 mmHg. Left ventricular ejection fraction, by estimation, is 60 to 65%. The left ventricle has normal function. The left ventricle has no regional wall motion abnormalities. The left  ventricular internal cavity size was normal in size. There is moderate concentric left ventricular hypertrophy. Left ventricular diastolic parameters are consistent with Grade I diastolic dysfunction (impaired relaxation). Indeterminate filling pressures. Right Ventricle: The right ventricular size is normal. No increase in right ventricular wall thickness. Right ventricular systolic function is normal. There is normal pulmonary artery systolic pressure. The tricuspid regurgitant velocity is 1.72 m/s, and  with an assumed right atrial pressure of 8 mmHg, the estimated right ventricular systolic pressure is 92.4 mmHg. Left Atrium: Left atrial size was normal in size. Right Atrium: Right atrial size was normal in size. Pericardium: There is no evidence of pericardial effusion. Mitral Valve: The mitral valve is normal in structure. No evidence of mitral valve regurgitation. No evidence of mitral valve stenosis. Tricuspid Valve: The tricuspid valve is normal in structure. Tricuspid valve regurgitation is trivial. No evidence of tricuspid stenosis. Aortic Valve: The aortic  valve is calcified. There is moderate calcification of the aortic valve. Aortic valve regurgitation is not visualized. No aortic stenosis is present. Pulmonic Valve: The pulmonic valve was normal in structure. Pulmonic valve regurgitation is not visualized. No evidence of pulmonic stenosis. Aorta: The aortic root is normal in size and structure. Venous: The inferior vena cava is normal in size with greater than 50% respiratory variability, suggesting right atrial pressure of 3 mmHg. IAS/Shunts: No atrial level shunt detected by color flow Doppler.  LEFT VENTRICLE PLAX 2D LVIDd:         3.20 cm  Diastology LVIDs:         2.10 cm  LV e' medial:    4.68 cm/s LV PW:         1.30 cm  LV E/e' medial:  11.5 LV IVS:        1.50 cm  LV e' lateral:   6.85 cm/s LVOT diam:     2.00 cm  LV E/e' lateral: 7.9 LV SV:         43 LV SV Index:   19 LVOT Area:     3.14 cm  RIGHT VENTRICLE RV S prime:     13.50 cm/s TAPSE (M-mode): 1.6 cm LEFT ATRIUM             Index       RIGHT ATRIUM           Index LA diam:        3.20 cm 1.43 cm/m  RA Area:     12.50 cm LA Vol (A2C):   46.5 ml 20.82 ml/m RA Volume:   26.60 ml  11.91 ml/m LA Vol (A4C):   33.6 ml 15.04 ml/m LA Biplane Vol: 41.5 ml 18.58 ml/m  AORTIC VALVE LVOT Vmax:   98.65 cm/s LVOT Vmean:  79.600 cm/s LVOT VTI:    0.138 m  AORTA Ao Root diam: 3.10 cm MITRAL VALVE                TRICUSPID VALVE MV Area (PHT): 3.10 cm     TR Peak grad:   11.8 mmHg MV Decel Time: 245 msec     TR Vmax:        172.00 cm/s MV E velocity: 54.00 cm/s MV A velocity: 102.00 cm/s  SHUNTS MV E/A ratio:  0.53         Systemic VTI:  0.14 m  Systemic Diam: 2.00 cm Skeet Latch MD Electronically signed by Skeet Latch MD Signature Date/Time: 12/02/2020/5:47:16 PM    Final    VAS [place] LOWER EXTREMITY VENOUS (DVT)  Result Date: 12/12/2020  Lower Venous DVT Study Patient Name:  DELANDO SATTER  Date of Exam:   12/12/2020 Medical Rec #: [id number]     Accession #:    [id number]  Date of Birth: [birth year]     Patient Gender: M Patient Age:   25 years Exam Location:  [facility] Procedure:      VAS [place] LOWER EXTREMITY VENOUS (DVT) Referring Phys: MURALI RAMASWAMY --------------------------------------------------------------------------------  Indications: Edema.  Limitations: Body habitus, poor ultrasound/tissue interface and continuous HD to left groin. Comparison Study: Previous exam 1.5.2013 negative for DVT Performing Technologist: Jody Hill RVT, RDMS  Examination Guidelines: A complete evaluation includes B-mode imaging, spectral Doppler, color Doppler, and power Doppler as needed of all accessible portions of each vessel. Bilateral testing is considered an integral part of a complete examination. Limited examinations for reoccurring indications may be performed as noted. The reflux portion of the exam is performed with the patient in reverse Trendelenburg.  +---------+---------------+---------+-----------+----------+--------------+ RIGHT    CompressibilityPhasicitySpontaneityPropertiesThrombus Aging +---------+---------------+---------+-----------+----------+--------------+ CFV      Partial        Yes      Yes                  Acute          +---------+---------------+---------+-----------+----------+--------------+ SFJ      Partial                                      Acute          +---------+---------------+---------+-----------+----------+--------------+ FV Prox  None           No       No                   Acute          +---------+---------------+---------+-----------+----------+--------------+ FV Mid   Full           Yes      Yes                                 +---------+---------------+---------+-----------+----------+--------------+ FV DistalFull           Yes      Yes                                 +---------+---------------+---------+-----------+----------+--------------+ PFV      None           No       No                    Acute          +---------+---------------+---------+-----------+----------+--------------+ POP      None           No       No                   Acute          +---------+---------------+---------+-----------+----------+--------------+ PTV  Not visualized +---------+---------------+---------+-----------+----------+--------------+ PERO                                                  Not visualized +---------+---------------+---------+-----------+----------+--------------+   Right Technical Findings: Not visualized segments include peroneal and posterior tibial veins.  +---------+---------------+---------+-----------+----------+--------------+ LEFT     CompressibilityPhasicitySpontaneityPropertiesThrombus Aging +---------+---------------+---------+-----------+----------+--------------+ CFV      Full           Yes      Yes                                 +---------+---------------+---------+-----------+----------+--------------+ SFJ      Full                                                        +---------+---------------+---------+-----------+----------+--------------+ FV Prox  Full           Yes      Yes                                 +---------+---------------+---------+-----------+----------+--------------+ FV Mid   Full           Yes      Yes                                 +---------+---------------+---------+-----------+----------+--------------+ FV DistalFull           Yes      Yes                                 +---------+---------------+---------+-----------+----------+--------------+ PFV      Full                                                        +---------+---------------+---------+-----------+----------+--------------+ POP      Full           Yes      Yes                                 +---------+---------------+---------+-----------+----------+--------------+ PTV       Full                                                        +---------+---------------+---------+-----------+----------+--------------+ PERO                                                  Not visualized +---------+---------------+---------+-----------+----------+--------------+ CFV, SFJ,  PFV, and FV prox not well visualized due to HD placement. PopV difficult to image due to patient position.  Left Technical Findings: Not visualized segments include peroneal veins.   Summary: BILATERAL: - No evidence of superficial venous thrombosis in the lower extremities, bilaterally. -No evidence of popliteal cyst, bilaterally. RIGHT: - Findings consistent with acute deep vein thrombosis involving the right common femoral vein, SF junction, right femoral vein (prox), right proximal profunda vein, and right popliteal vein. - Portions of this examination were limited- see technologist comments above.  LEFT: - Portions of this examination were limited- see technologist comments above. - There is no evidence of deep vein thrombosis in the lower extremity.  *See table(s) above for measurements and observations. Electronically signed by Jamelle Haring on 12/12/2020 at 5:32:15 PM.    Final    CT CHEST ABDOMEN PELVIS WO CONTRAST  Result Date: 12/01/2020 CLINICAL DATA:  Abdominal pain and fever EXAM: CT CHEST, ABDOMEN AND PELVIS WITHOUT CONTRAST TECHNIQUE: Multidetector CT imaging of the chest, abdomen and pelvis was performed following the standard protocol without IV contrast. COMPARISON:  09/04/2020 FINDINGS: CT CHEST FINDINGS Cardiovascular: Calcific atherosclerosis of the aorta and coronary arteries. No pericardial effusion. Course and caliber of the aorta are normal. Right chest wall Port-A-Cath tip is at the cavoatrial junction. Mediastinum/Nodes: No enlarged mediastinal, hilar, or axillary lymph nodes. Thyroid gland, trachea, and esophagus demonstrate no significant findings. Lungs/Pleura: Lungs are clear. No  pleural effusion or pneumothorax. Musculoskeletal: No chest wall mass or suspicious bone lesions identified. CT ABDOMEN PELVIS FINDINGS Hepatobiliary: No focal liver abnormality is seen. No gallstones, gallbladder wall thickening, or biliary dilatation. Pancreas: Unremarkable. No pancreatic ductal dilatation or surrounding inflammatory changes. Spleen: Normal in size without focal abnormality. Adrenals/Urinary Tract: Adrenal glands are unremarkable. Kidneys are normal, without renal calculi, focal lesion, or hydronephrosis. Bladder is unremarkable. 3.5 cm left renal cyst. Stomach/Bowel: Normal appearance of the stomach and duodenum. No small bowel dilatation. 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 Status post partial colectomy. Vascular/Lymphatic: Aortic atherosclerosis. No enlarged abdominal or pelvic lymph nodes. Reproductive: Prostate is unremarkable. Other: Bilateral fat containing inguinal hernias. Musculoskeletal: No acute or significant osseous findings. IMPRESSION: 1. No acute abnormality of the chest, abdomen or pelvis. 2. Status post partial colectomy.  No evidence of obstruction. Aortic Atherosclerosis (ICD10-I70.0). Electronically Signed   By: Ulyses Jarred M.D.   On: 12/01/2020 02:20    ASSESSMENT AND PLAN: Transverse colon cancer, stage IIIb (T3N1a), status post a partial transverse colectomy and end colostomy 09/10/2020 CT abdomen/pelvis 09/04/2020- possible transverse colon mass with small regional lymph nodes Colonoscopy 09/05/2020- obstructing transverse colon mass-biopsy invasive adenocarcinoma, mass could not be passed CT chest 09/09/2020-no evidence of metastatic disease, 3 x 2 mm subpleural nodule in the right upper lobe likely benign subpleural lymph node Partial transverse colectomy 09/10/2020, transverse colon tumor, no lymphovascular perineural invasion, 1/19 lymph nodes positive, negative margins, MSI stable, no loss of mismatch repair protein expression Cycle 1 CAPOX 10/08/2020,  oxaliplatin dose reduced to 100 mg per metered squared secondary to renal insufficiency Cycle 2 CAPOX 10/29/2020 Cycle 3 CAPOX 11/19/2020, capecitabine dose reduced secondary to hand/foot syndrome, oxaliplatin dose reduced secondary to renal failure and poor performance status CT chest/abdomen/pelvis without contrast 12/01/2020-no acute abnormality in the chest, abdomen, or pelvis.  No evidence of obstruction. Resection of a frontal meningioma 09/01/2016 Diabetes Rectal polyp- tubular adenoma on colonoscopy 09/05/2020 Hypertension Diabetic neuropathy Epilepsy Family history of breast and prostate cancer Admission with acute renal failure 10/01/2020-secondary to lack  of oral intake and lisinopril, improved with intravenous hydration and holding lisinopril Admission 12/01/2020-sepsis, diarrhea HHS, A. fib with RVR, abdominal pain 11.  Respiratory failure requiring intubation 12/06/2020 12.  Progressive renal failure-CRRT started 12/06/2020 13.  Thrombocytopenia secondary to chemotherapy and sepsis, heparin-induced platelet antibody negative on 522 14.  Right lower extremity DVT diagnosed 12/12/2020  Thomas [name] remains sedated on the ventilator.  His oxygenation appears adequate.  They are attempting to wean and may extubate later today.  He remains on CRRT.  Renal function appears to be improving.  The skin breakdown at the hands appears to be slowly improving.  He continues to have liquid output from the ostomy.  This has slowed.  The patient had a drop in his platelets but platelet count now improving.  This is most likely secondary to chemotherapy and sepsis.  However, HIT also a consideration.  HIT panel sent 12/12/2020 was negative.  SRA sent on 12/12/2020 is currently pending.  The patient did receive prophylactic subcutaneous heparin from 7/25 through 7/28 and also 7/30 through 8/1.  He also received a heparin infusion 8/1 through 8/2.  Now receiving argatroban.  Recommendations: 1.  Continue  management of sepsis syndrome and respiratory failure per critical care medicine 2.  Moisturizers, skin care to the hands and scrotum 3.  Management of renal failure/fluids per nephrology 4.  Check platelet count daily -we will follow-up on SRA, continue argatroban   LOS: 14 days   Thomas [name] 12/15/20 Mr. Archey was interviewed and examined.  His overall status appears unchanged.  The skin changes at the lower lip and hands appear improved.  He is now maintained on argatroban for treatment of a right lower extremity DVT with the suspicion of HIT.  I have a low clinical suspicion for HIT given the negative heparin antibody study.  We will follow-up on the serotonin release assay.  I was present for greater than 50% of today's visit.  I performed medical decision making.  Julieanne Manson, MD

## 2020-12-15 NOTE — Progress Notes (Signed)
Patient reintubated  Size 8 endotracheal tube, 25 at lip Received 50 of fentanyl, 1 of Versed, 10 of etomidate  A lot of secretions in pharynx the patient was not clearing

## 2020-12-15 NOTE — Progress Notes (Signed)
Inpatient Diabetes Program Recommendations  AACE/ADA: New Consensus Statement on Inpatient Glycemic Control (2015)  Target Ranges:  Prepandial:   less than 140 mg/dL      Peak postprandial:   less than 180 mg/dL (1-2 hours)      Critically ill patients:  140 - 180 mg/dL   Lab Results  Component Value Date   GLUCAP 363 (H) 12/15/2020   HGBA1C 8.7 (H) 12/01/2020    Diabetes history: DM 2 Outpatient Diabetes medications: Metformin 500 mg bid Current orders for Inpatient glycemic control:  Novolog 0-15 units Q4 hours   Vital HP 60 ml/hour   Inpatient Diabetes Program Recommendations:     Glucose trends above inpatient goal. Pt on tube feeds   -  Consider adding Novolog 2 units Q4 hours Tube Feed coverage   Continue to follow.  Thank you. Lorenda Peck, RD, LDN, CDE Inpatient Diabetes Coordinator [phone]

## 2020-12-15 NOTE — Progress Notes (Signed)
ANTICOAGULATION CONSULT NOTE - Follow Up Consult  Pharmacy Consult for Argatroban Indication: DVT and rule out HIT  No Known Allergies  Patient Measurements: Height: 5\' 8"  (172.7 cm) Weight: 106.6 kg (235 lb 0.2 oz) IBW/kg (Calculated) : 68.4 Heparin Dosing Weight:   Vital Signs: Temp: 100.3 F (37.9 C) (08/08 1140) Temp Source: Axillary (08/08 1140) BP: 127/74 (08/08 1100) Pulse Rate: 80 (08/08 1100)  Labs: Recent Labs    12/13/20 0312 12/13/20 0930 12/14/20 0400 12/14/20 0700 12/14/20 2109 12/14/20 2213 12/15/20 0409 12/15/20 1100  HGB 9.6*  --  9.6*  --   --   --  9.3*  --   HCT 29.6*  --  29.7*  --   --   --  28.9*  --   PLT 94*  --  104*  --   --   --  112*  --   APTT 45*   < >  --    < >  --  48* 49* 58*  CREATININE 1.34*   < > 1.49*  --  1.61*  --  1.44*  --    < > = values in this interval not displayed.    Estimated Creatinine Clearance: 61.4 mL/min (A) (by C-G formula based on SCr of 1.44 mg/dL (H)).   Medications:  Infusions:    prismasol BGK 4/2.5 400 mL/hr at 12/14/20 1435    prismasol BGK 4/2.5 200 mL/hr at 12/15/20 0549   sodium chloride 10 mL/hr at 12/15/20 1100   sodium chloride Stopped (12/12/20 1729)   anticoagulant sodium citrate     argatroban 0.87 mcg/kg/min (12/15/20 1100)   dexmedetomidine (PRECEDEX) IV infusion 0.3 mcg/kg/hr (12/15/20 1100)   levETIRAcetam Stopped (12/15/20 1028)   prismasol BGK 4/2.5 1,800 mL/hr at 12/15/20 1133   promethazine (PHENERGAN) injection (IM or IVPB)     thiamine injection Stopped (12/15/20 0933)    Assessment: Thomas [name] admitted on 7/24 with high ostomy output, sepsis vs chemotherapy reaction; developed respiratory failure, aspiration, AKI, ischemic colitis.  He was initially on prophylactic heparin, then apixaban for Afib, then pharmacy was consulted to dose heparin IV for afib, then stopped for bleeding on 8/2 (also back in NSR), but remained on heparin in CRRT circuit.  Pharmacy was consulted for  Argatroban for DVT on 8/5 due to concern for HITT.    - 8/5 HIT antb: 0.144, negative (0 - 0.400) - 8/5 SRA: pending  Today, 12/15/2020: - aPTT 58, therapeutic on argatroban 0.87 mcg/kg/hr - CBC: Hgb remains low/stable at 9.3, Plt improved to 112 - No bleeding or complications reported. - Extubated today.  Remains on CRRT with no clotting issues reported.  Goal of Therapy:  aPTT 50-90 seconds Monitor platelets by anticoagulation protocol: Yes   Plan:  Continue Argatroban at 0.87 mcg/kg/min IV  Recheck aPTT in 4 hrs to confirm level is still therapeutic before changing to q12h aPTT monitoring Daily CBC Follow up [place] PharmD, [facility] main pharmacy [phone] 12/15/2020 11:58 AM

## 2020-12-15 NOTE — Procedures (Signed)
  Intubation Procedure Note  Thomas [name]  [id number]  01-28-1956  Date:12/15/20  Time:12:37 PM   Provider Performing:Ivorie Uplinger A [name]    Procedure: Intubation (47998)  Indication(s) Respiratory Failure  Consent Unable to obtain consent due to emergent nature of procedure.   Anesthesia Etomidate, Versed, and Fentanyl 10 etomidate, 50 fentanyl,1 versed  Time Out Verified patient identification, verified procedure, site/side was marked, verified correct patient position, special equipment/implants available, medications/allergies/relevant history reviewed, required imaging and test results available.   Sterile Technique Usual hand hygeine, masks, and gloves were used   Procedure Description Patient positioned in bed supine.  Sedation given as noted above.  Patient was intubated with endotracheal tube using Glidescope.  View was Grade 2 only posterior commissure .  Number of attempts was 1.  Colorimetric CO2 detector was consistent with tracheal placement.   Complications/Tolerance None; patient tolerated the procedure well. Chest X-ray is ordered to verify placement.   EBL Minimal   Specimen(s) None

## 2020-12-15 NOTE — Progress Notes (Signed)
[facility] Progress Note  Subjective: - 1.8 L yesterday, down to 107kg today (peak 118kg)  Vitals:   12/15/20 0615 12/15/20 0630 12/15/20 0645 12/15/20 0700  BP:  113/68  111/66  Pulse: 76 79 78 78  Resp: (!) 26 (!) 26 (!) 26 (!) 26  Temp:      TempSrc:      SpO2: 100% 100% 100% 100%  Weight:      Height:        Exam: RUE:[id number] and sedated CVS: RRR Resp: occ rhonchi Abd: +BS, soft, NT/ND Ext: mild edema 1+ RLE, no edema LLE      Assessment/ Plan: AKI/CKD stage IIIb - presumably ischemic ATN in setting of severe sepsis and MSOF. Remains oliguric.  CRRT initiated 7/31. Heparin stopped 12/12/20 after diagnosed with RLE DVT and started on argatroban.  L fem cath placed 12/07/20.  Volume - anasarca resolved, has minimal RLE edema now and wt's are down 118 > 107kg. CXR is clear. Will keep even for now w/ CRRT.  Acute hypoxic respiratory failure/VDRF - aspiration pneumonia.  SP course of  IV maxipime/vanc.  Extubated this am, on bipap.  Right leg DVT - now on argatroban. Thrombocytopenia - presumably due to critical illness.  Hematology following and HIT panel pending.  Heparin stopped 12/12/20 after starting argatroban for RLE DVT.  Platelets improved slightly to 94.   Metabolic acidosis - nongap.  Currently on CRRT Atrial fibrillation -on amiodarone and rate controlled. Anemia of critical illness- transfuse prn. Acute metabolic encephalopathy - per CCM Hypophosphatemia - requiring IV repletion.  Continue to follow.      Rob Victorhugo Preis 12/15/2020, 7:14 AM   Recent Labs  Lab 12/14/20 0400 12/14/20 2109 12/15/20 0409  K 4.1 3.9 4.5  BUN 33* 33* 33*  CREATININE 1.49* 1.61* 1.44*  CALCIUM 7.2* 7.4* 7.5*  PHOS 2.3* 1.5* 3.4  HGB 9.6*  --  9.3*   Inpatient medications:  albuterol  2.5 mg Nebulization QID   chlorhexidine gluconate (MEDLINE KIT)  15 mL Mouth Rinse BID   Chlorhexidine Gluconate Cloth  6 each Topical Daily   clonazepam  1 mg Per Tube TID    cloNIDine  0.1 mg Per Tube TID   docusate  100 mg Per Tube BID   famotidine  20 mg Per Tube Daily   feeding supplement (PROSource TF)  45 mL Per Tube TID   feeding supplement (VITAL HIGH PROTEIN)  1,000 mL Per Tube Q24H   hydrocerin   Topical BID   hydrocortisone sodium succinate  50 mg Intravenous Q12H   insulin aspart  0-15 Units Subcutaneous Q4H   mouth rinse  15 mL Mouth Rinse 10 times per day   OLANZapine  5 mg Per Tube QHS   polyethylene glycol  17 g Per Tube Daily     prismasol BGK 4/2.5 400 mL/hr at 12/14/20 1435    prismasol BGK 4/2.5 200 mL/hr at 12/15/20 0549   sodium chloride 10 mL/hr at 12/15/20 0700   sodium chloride Stopped (12/12/20 1729)   anticoagulant sodium citrate     argatroban 0.87 mcg/kg/min (12/15/20 0712)   dexmedetomidine (PRECEDEX) IV infusion 0.9 mcg/kg/hr (12/15/20 0700)   levETIRAcetam Stopped (12/14/20 2131)   prismasol BGK 4/2.5 1,800 mL/hr at 12/15/20 0546   promethazine (PHENERGAN) injection (IM or IVPB)     thiamine injection Stopped (12/14/20 2203)   sodium chloride, sodium chloride, acetaminophen, alteplase, anticoagulant sodium citrate, dextrose, docusate, fentaNYL (SUBLIMAZE) injection, methocarbamol, promethazine (PHENERGAN) injection (IM or IVPB),  sodium chloride, sodium chloride flush

## 2020-12-16 DIAGNOSIS — E1100 Type 2 diabetes mellitus with hyperosmolarity without nonketotic hyperglycemic-hyperosmolar coma (NKHHC): Secondary | ICD-10-CM | POA: Diagnosis not present

## 2020-12-16 LAB — RENAL FUNCTION PANEL
Albumin: 1.8 g/dL — ABNORMAL LOW (ref 3.5–5.0)
Anion gap: 8 (ref 5–15)
BUN: 37 mg/dL — ABNORMAL HIGH (ref 8–23)
CO2: 26 mmol/L (ref 22–32)
Calcium: 7.3 mg/dL — ABNORMAL LOW (ref 8.9–10.3)
Chloride: 102 mmol/L (ref 98–111)
Creatinine, Ser: 1.57 mg/dL — ABNORMAL HIGH (ref 0.61–1.24)
GFR, Estimated: 49 mL/min — ABNORMAL LOW (ref >60)
Glucose, Bld: 315 mg/dL — ABNORMAL HIGH (ref 70–99)
Phosphorus: 3.3 mg/dL (ref 2.5–4.6)
Potassium: 4.8 mmol/L (ref 3.5–5.1)
Sodium: 136 mmol/L (ref 135–145)

## 2020-12-16 LAB — GLUCOSE, CAPILLARY
Glucose-Capillary: 312 mg/dL — ABNORMAL HIGH (ref 70–99)
Glucose-Capillary: 259 mg/dL — ABNORMAL HIGH (ref 70–99)
Glucose-Capillary: 234 mg/dL — ABNORMAL HIGH (ref 70–99)
Glucose-Capillary: 265 mg/dL — ABNORMAL HIGH (ref 70–99)
Glucose-Capillary: 365 mg/dL — ABNORMAL HIGH (ref 70–99)
Glucose-Capillary: 188 mg/dL — ABNORMAL HIGH (ref 70–99)

## 2020-12-16 LAB — APTT
aPTT: 64 seconds — ABNORMAL HIGH (ref 24–36)
aPTT: 151 seconds — ABNORMAL HIGH (ref 24–36)

## 2020-12-16 LAB — SEROTONIN RELEASE ASSAY (SRA)
SRA .2 IU/mL UFH Ser-aCnc: <1 % (ref 0–20)
SRA 100IU/mL UFH Ser-aCnc: <1 % (ref 0–20)

## 2020-12-16 LAB — HEPARIN LEVEL (UNFRACTIONATED): Heparin Unfractionated: 0.69 IU/mL (ref 0.30–0.70)

## 2020-12-16 LAB — CBC
HCT: 27.5 % — ABNORMAL LOW (ref 39.0–52.0)
Hemoglobin: 8.8 g/dL — ABNORMAL LOW (ref 13.0–17.0)
MCH: 28.9 pg (ref 26.0–34.0)
MCHC: 32 g/dL (ref 30.0–36.0)
MCV: 90.5 fL (ref 80.0–100.0)
Platelets: 113 10*3/uL — ABNORMAL LOW (ref 150–400)
RBC: 3.04 MIL/uL — ABNORMAL LOW (ref 4.22–5.81)
RDW: 24.1 % — ABNORMAL HIGH (ref 11.5–15.5)
WBC: 16.7 10*3/uL — ABNORMAL HIGH (ref 4.0–10.5)
nRBC: 0.3 % — ABNORMAL HIGH (ref 0.0–0.2)

## 2020-12-16 LAB — MAGNESIUM: Magnesium: 2.3 mg/dL (ref 1.7–2.4)

## 2020-12-16 MED ORDER — HEPARIN (PORCINE) 25000 UT/250ML-% IV SOLN
1450.0000 [IU]/h | INTRAVENOUS | Status: DC
  Administered 2020-12-16: 1450 [IU]/h via INTRAVENOUS
  Filled 2020-12-16: qty 250

## 2020-12-16 MED ORDER — VITAL AF 1.2 CAL PO LIQD
1000.0000 mL | ORAL | Status: DC
  Administered 2020-12-16 – 2020-12-17 (×3): 1000 mL
  Filled 2020-12-16: qty 1000

## 2020-12-16 MED ORDER — INSULIN DETEMIR 100 UNIT/ML ~~LOC~~ SOLN
10.0000 [IU] | Freq: Two times a day (BID) | SUBCUTANEOUS | Status: DC
  Administered 2020-12-16 – 2020-12-17 (×4): 10 [IU] via SUBCUTANEOUS
  Filled 2020-12-16 (×7): qty 0.1

## 2020-12-16 MED ORDER — HEPARIN SODIUM (PORCINE) 1000 UNIT/ML DIALYSIS
1000.0000 [IU] | Freq: Once | INTRAMUSCULAR | Status: AC
  Administered 2020-12-16: 2000 [IU]
  Filled 2020-12-16: qty 6

## 2020-12-16 MED ORDER — PROSOURCE TF PO LIQD
90.0000 mL | Freq: Four times a day (QID) | ORAL | Status: DC
  Administered 2020-12-16 – 2020-12-17 (×7): 90 mL
  Filled 2020-12-16 (×6): qty 90

## 2020-12-16 NOTE — Progress Notes (Signed)
CRRT was stopped at 1632 per Dr Barkley Bruns orders. Blood was successfully returned to pt. Red and Blue HD cath lumens instilled with 1.6 mLs of heparin solution per orders. This RN will continue to carefully monitor pt.

## 2020-12-16 NOTE — Progress Notes (Signed)
SLP Cancellation Note  Patient Details Name: Thomas [name] MRN: [id number] DOB: May 17, 1955   Cancelled treatment:       Reason Eval/Treat Not Completed: Other (comment) (pt remains on vent, please reorder if you desire when pt extubated, thanks.)  [name], MS [place] Office [phone] Pager [phone]  [name] 12/16/2020, 12:33 PM

## 2020-12-16 NOTE — Progress Notes (Addendum)
NAME:  Thomas [name], MRN:  [id number], DOB:  Oct 19, 1955, LOS: 60 ADMISSION DATE:  11/30/2020, CONSULTATION DATE:  7/30 REFERRING MD:  Riccardo Dubin, CHIEF COMPLAINT:  Worsening hypoxia/dyspnea  History of Present Illness:  65 yo M admitted for worsening sepsis vs reaction to chemotherapy with high ostomy output and soft BP on 7/25. Has had issues during his hospitalization with recurrent aspirations and more recently worsening respiratory status.  Most recently pt developed worsening hypoxia and was requiring BIPAP w/ FIO2 of 100% and increased work of breathing. Unable to obtain ABG, CXR in AM of 7/30 showed some worsening concern of aspiration.   Who was admitted to intensive care and subsequently had worsening clinical status requiring intubation, CRRT, multiple pressors and pneumonia along with CT findings 8/1 concerning for ischemic bowel.  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  Hx of remote MCA stroke Asthma hx as a child Epilepsy on keppra GERD T2DM on metformin as outpt HTN HLD OSA  Significant Hospital Events:  7/27 Transferred to [facility], stopped abx as sepsis resolved/ruled out 7/30 AM Worsened AME, more hypoxic with worsening renal funciton, new aspiration on abx restarted 7/30 PM Pt with increased o2 requirement on 100% FIO2 on BIPAP, d/w family who wanted to proceed with intubation, pt intubated 8/1 Some issues with tachycardia and hypotension overnight. Reproted high ostomy output  8/2 down to 9 mcg of Levophed, however repeat CT abdomen/pelvis showed inflammatory wall thickening and fat stranding of the descending and sigmoid colon, new since prior, consistent with nonspecific infectious, inflammatory, or ischemic colitis.  Also new dense consolidative airspace opacity in the right lower and right middle lobes 8/3  further weaning NE. Weaning sedation. Full code, full scope per family meeting 8/2  8/4 off pressors, plt down to 103.  8/5 remains off pressors. Moving around more, not really following commands though  8/9-failed extubation 8/8, tolerated nasal cannula for about an hour, desaturations and inability to clear secretions-reintubated 8/8  Micro: Resp viral panel 7/24 > neg covid/ flu BC  7/24  x 2 neg  MRSA  7/25 neg  UC   7/25  neg  C diff screeen 7/26  neg ET culture 7/31 > moderate yeast> grew mod candida tropicalis GI panel  8/1> neg   Abx: Unasyn 7/30 >>7/31 Maxepime 7/31 >> 8/7 Flagyl 7/31 >>  8/7 Vancomycin 8/1 only  Anidulafungin 8/1>>8/3    Scheduled Meds:  albuterol  2.5 mg Nebulization QID   chlorhexidine gluconate (MEDLINE KIT)  15 mL Mouth Rinse BID   Chlorhexidine Gluconate Cloth  6 each Topical Daily   clonazepam  1 mg Per Tube TID   cloNIDine  0.1 mg Per Tube TID   docusate  100 mg Per Tube BID   famotidine  20 mg Per Tube Daily   feeding supplement (PROSource TF)  45 mL Per Tube TID   feeding supplement (VITAL HIGH PROTEIN)  1,000 mL Per Tube Q24H   hydrocerin   Topical BID   hydrocortisone sodium succinate  50 mg Intravenous Q12H   insulin aspart  0-15 Units Subcutaneous Q4H   mouth rinse  15 mL Mouth Rinse 10 times per day   OLANZapine  5 mg Per Tube QHS   polyethylene glycol  17 g Per Tube Daily   Continuous Infusions:   prismasol BGK 4/2.5 400 mL/hr at 12/16/20 0450  prismasol BGK 4/2.5 200 mL/hr at 12/15/20 0549   sodium chloride 10 mL/hr at 12/16/20 0800   sodium chloride Stopped (12/12/20 1729)   anticoagulant sodium citrate     argatroban 0.87 mcg/kg/min (12/16/20 0800)   dexmedetomidine (PRECEDEX) IV infusion 0.7 mcg/kg/hr (12/16/20 0800)   levETIRAcetam Stopped (12/15/20 2144)   prismasol BGK 4/2.5 1,800 mL/hr at 12/16/20 0653   promethazine (PHENERGAN) injection (IM or IVPB)     thiamine injection Stopped (12/15/20 2329)   PRN  Meds:.sodium chloride, sodium chloride, acetaminophen, alteplase, anticoagulant sodium citrate, dextrose, docusate, fentaNYL (SUBLIMAZE) injection, methocarbamol, promethazine (PHENERGAN) injection (IM or IVPB), sodium chloride, sodium chloride flush    Interim History / Subjective:  Sedated No overnight events  Objective   Blood pressure 118/68, pulse 71, temperature 98.7 F (37.1 C), temperature source Axillary, resp. rate 14, height _0  (1.727 m), weight 103.4 kg, SpO2 100 %.    Vent Mode: PRVC FiO2 (%):  [40 %-60 %] 40 % Set Rate:  [14 bmp-18 bmp] 14 bmp Vt Set:  [550 mL] 550 mL PEEP:  [5 cmH20] 5 cmH20 Plateau Pressure:  [14 cmH20-17 cmH20] 15 cmH20   Intake/Output Summary (Last 24 hours) at 12/16/2020 4315 Last data filed at 12/16/2020 0800 Gross per 24 hour  Intake 2674.62 ml  Output 3385 ml  Net -710.38 ml   Filed Weights   12/14/20 0500 12/15/20 0521 12/16/20 0500  Weight: 110.9 kg 106.6 kg 103.4 kg    Tmax afebrile General appearance:   Sedated  endotracheal tube in place, no JVD, Neck is supple Bilateral rhonchi S1-S2 appreciated Bowel sounds appreciated 1+ edema  Resolved Hospital Problem list   Shock  HHS Afib RVR  Lactic Acidosis    Assessment & Plan:   Altered mental status Acute metabolic encephalopathy History of CVA in 2015 History of seizure disorder -Continue Keppra -Continue to wean as tolerated  Acute hypoxemic respiratory failure Aspiration pneumonia -VAP, pulmonary hygiene -Failed extubation 12/15/2020 -Reintubated for significant secretions  Aspiration pneumonia Colitis -Completed antibiotics-Flagyl and cefepime on 8/7 -Continue to monitor ostomy output -Remains on tube feeds  AKI on chronic kidney disease stage IIIb On CRRT -CRRT per nephrology  Stage IIIb transverse colon cancer S/p partial transverse colectomy, end colectomy On Capox-has had 3 cycles 6/1, 6/22, 7/13 -Follows up with Dr. Ammie Dalton  Heart failure with  preserved ejection fraction -We will continue to monitor Last EF of 60 to 65%  Thrombocytopenia -Continue to monitor -Secondary to chemo and critical illness  Hypertension Hyperlipidemia Type 2 diabetes -Continue SSI  Capecitabine induced desquamation Hand/foot syndrome -Continue moisturizer to hands and feet and scrotum -Lesions does appear to be improving  Goals of care discussion 8/2 -Continue full scope of care  Will reattempt extubation 8/10 -We will give him a day of rest -Drying of secretions would not be appropriate as it likely will cause more respiratory compromise if it clogs up his pharynx  Best Practice  Diet/type: tf per nutrition DVT prophylaxis: SCD/ argatroban GI prophylaxis: H2B Lines: Port, femoral trialysis,  Foley:  N/A Code Status:  full code Last date of goals of care discussion : 8/2 : PCCM Palli, RN, family -- full code full scope   The patient is critically ill with multiple organ systems failure and requires high complexity decision making for assessment and support, frequent evaluation and titration of therapies, application of advanced monitoring technologies and extensive interpretation of multiple databases. Critical Care Time devoted to patient care services described in this note independent of  APP/resident time (if applicable)  is 32 minutes.   Sherrilyn Rist MD [facility] Personal pager: See Amion If unanswered, please page CCM On-call: [phone]

## 2020-12-16 NOTE — Progress Notes (Signed)
Palliative:  HPI: 65 yo male with PMH significant for stage 3b colon cancer, L MCA stroke, frontal brain tumor, seizures, HTN, HLD, sleep apnea, aspiration pneumonia, diabetes admitted 11/30/2020 with abd pain, anorexia, and weakness. Hospitalization complicated by septic shock of uncertain origin (potentially likely colitis vs pneumonia) leading to need for intubation, vasopressors, CRRT. His condition remains tenuous in ICU.   I met today at [facility] bedside but no family present. He is receiving chest PT and CRRT still going. Still no urine output. Plans to stop CRRT and transfer to [facility] for intermittent dialysis per nephrology. Per PCCM they are hopeful that he may be able to try and be extubated successfully but may need tracheostomy (more likely due to weakness/debility and weak cough to protect airway).   I called and discussed with wife, Jerlyn Ly. I relayed the above to her and plans for transition to to [facility]. I explained that this transfer will not change his plan of care but just the location from one ICU to another. This may change the timing of when everything may occur depending on when he transfers to [facility]. We discussed that there may be another attempt at extubation if the opportunity presents itself. We discussed if he fails extubation again or if we anticipate that he cannot tolerate extubation we would proceed with tracheostomy. Mahalia expresses understanding.    All questions/concerns addressed. Emotional support provided.   Exam: Sedated on vent. Receiving chest PT. Tolerating vent. No distress. No urine output.   Plan: - No changes in goals of care.  - Full code, full scope.   15 min  Vinie Sill, NP Palliative Medicine Team Pager [phone] (Please see [email or web address] for schedule) Team Phone [phone]    Greater than 50%  of this time was spent counseling and coordinating care related to the above assessment and plan

## 2020-12-16 NOTE — Progress Notes (Addendum)
ANTICOAGULATION CONSULT NOTE - Follow Up Consult  Pharmacy Consult for Heparin Indication: DVT  No Known Allergies  Patient Measurements: Height: 5\' 8"  (172.7 cm) Weight: 103.4 kg (227 lb 15.3 oz) IBW/kg (Calculated) : 68.4 Heparin Dosing Weight: 91 kg  Vital Signs: Temp: 99.5 F (37.5 C) (08/09 1918) Temp Source: Axillary (08/09 1918) BP: 134/74 (08/09 2200) Pulse Rate: 74 (08/09 2200)  Labs: Recent Labs    12/14/20 0400 12/14/20 0700 12/15/20 0409 12/15/20 1100 12/15/20 1437 12/15/20 1528 12/16/20 0417 12/16/20 1709 12/16/20 2122  HGB 9.6*  --  9.3*  --  16.3  --  8.8*  --   --   HCT 29.7*  --  28.9*  --  48.0  --  27.5*  --   --   PLT 104*  --  112*  --   --   --  113*  --   --   APTT  --    < > 49*   < >  --  63* 64* 151*  --   HEPARINUNFRC  --   --   --   --   --   --   --   --  0.69  CREATININE 1.49*   < > 1.44*  --   --  1.61* 1.57*  --   --    < > = values in this interval not displayed.     Estimated Creatinine Clearance: 55.4 mL/min (A) (by C-G formula based on SCr of 1.57 mg/dL (H)).   Medications:  Infusions:   sodium chloride Stopped (12/12/20 1729)   dexmedetomidine (PRECEDEX) IV infusion 0.9 mcg/kg/hr (12/16/20 1938)   feeding supplement (VITAL AF 1.2 CAL) 1,000 mL (12/16/20 1407)   heparin 1,450 Units/hr (12/16/20 1509)   levETIRAcetam 1,000 mg (12/16/20 2131)   promethazine (PHENERGAN) injection (IM or IVPB)     thiamine injection Stopped (12/16/20 1000)    Assessment: 22 yom admitted on 7/24 with high ostomy output, sepsis vs chemotherapy reaction; developed respiratory failure, aspiration, AKI, ischemic colitis.  He was initially on prophylactic heparin, then apixaban for Afib, then pharmacy was consulted to dose heparin IV for afib, then stopped for bleeding on 8/2 (also back in NSR), but remained on heparin in CRRT circuit.  Pharmacy was consulted for Argatroban for DVT on 8/5 due to concern for HITT.  Pharmacy consulted on 8/9 to change  back to heparin with negative HIT Ab and SRA.  - 8/5 HIT antb: 0.144, negative (0 - 0.400) - 8/5 SRA: negative  Today, 12/16/2020: - Heparin level = 1.03 (supratherapeutic) on heparin gtt @ 1450 units/hr (previous heparin level therapeutic on this dose).  Confirmed with RN that heparin level drawn appropriate - no complications of therapy noted by RN - Hgb 8.3, Plt 125k  Goal of Therapy:  Heparin level 0.3-0.7 Monitor platelets by anticoagulation protocol: Yes   Plan:  Hold heparin x 1 hr then resume gtt @ 1250 units/hr Check heparin level 6 hr after heparin rate reduced Daily heparin level and CBC   Leone Haven, PharmD 12/16/2020 10:13 PM

## 2020-12-16 NOTE — Progress Notes (Signed)
eLink Physician-Brief Progress Note Patient Name: Thomas [name] DOB: [birth year] MRN: [id number]   Date of Service  12/16/2020  HPI/Events of Note  Agitation - Nursing request to renew bilateral soft wrist restraint orders.   eICU Interventions  Will renew bilateral soft wrist restraints X 10 hours.     Intervention Category Major Interventions: Delirium, psychosis, severe agitation - evaluation and management  [name] 12/16/2020, 10:45 PM

## 2020-12-16 NOTE — Progress Notes (Signed)
Stable hemodynamics  Plan for possible extubation in a.m. Failed extubation 12/15/2020 with inability to clear secretions  Updated spouse today that we will continue to try to wean but if he were to fail extubation again he may need tracheostomy which he is agreeable to  He is being taken off CRRT  Will transfer to [facility] for IHD

## 2020-12-16 NOTE — Progress Notes (Signed)
Nutrition Follow-up  DOCUMENTATION CODES:   Non-severe (moderate) malnutrition in context of chronic illness, Obesity unspecified  INTERVENTION:  - will change TF regimen: Vital AF 1.2 @ 45 ml/hr with 90 ml Prosource TF QID. - this regimen will provide 1440 ml/24, 1616 kcal, 169 grams protein, and 876 ml free water.   NUTRITION DIAGNOSIS:   Moderate Malnutrition related to chronic illness, cancer and cancer related treatments as evidenced by mild fat depletion, mild muscle depletion, percent weight loss. -ongoing  GOAL:   Patient will meet greater than or equal to 90% of their needs -met with TF regimen  MONITOR:   Vent status, TF tolerance, Labs, Weight trends, Skin, I & O's   ASSESSMENT:   65 y.o. male who is a former with medical history of stage 3 colon cancer s/p partial colectomy and end colostomy on 5/4, recent chemo start with 3rd cycle on 7/22, sleep apnea, seizures, epilepsy, brain tumor, HTN, HLD, GERD, asthma, peripheral neuropathy, urinary frequency, DM, MCA stroke. He presented to the ED via EMS due to abdominal pain x2 weeks, anorexia, and weakness. Admitted for close monitoring for concern of mesenteric ischemia. Patient reported that abdominal pain was worse with eating PTA.  Significant Events: 7/25- admission 7/26- initial RD assessment 7/27- diet advanced to CLD 7/28- diet advanced to Dysphagia 1, thin liquids 7/29- diet downgraded to NPO 7/30- intubation; OGT placement (gastric 7/31- non-tunneled triple lumen HD cath placed in L femoral; CRRT initiation 8/2- repeat CT abdomen/pelvis showed wall thickening and fat stranding of descending and sigmoid colon, non-specific infectious, inflammatory, or ischemic colitis; new consolidative airspace opacity in R lower and R middle lobes 8/3- initiation of trickle rate TF (Vital High Protein @ 10) 8/8- extubated and subsequently re-intubated; OGT replaced (gastric)   Patient remains intubated, OGT in place, and he  remains on CRRT. Able to talk with RN about patient. Patient discussed in rounds this AM.   He is receiving Vital High Protein @ 60 ml/hr with 45 ml Prosource TF TID. This regimen is providing 1560 kcal, 159 grams protein, and 1204 ml free water. Between TF and Prosource TF he is receiving 1575 ml/24 hrs.   Weight today is the lowest weight since admission. Non-pitting edema to BUE and deep pitting edema to BLE. He is noted to be -4 L since admission.    Patient is currently intubated on ventilator support MV: 9.4 L/min Temp (24hrs), Avg:99.2 F (37.3 C), Min:97.8 F (36.6 C), Max:100.8 F (38.2 C) Propofol: none BP: 122/70 and MAP: 84  Labs reviewed; CBGs: 312 and 259 mg/dl, BUN: 37 mmol/l, creatinine: 1.57 mg/dl, Ca: 7.3 mg/dl, GFR: 49 ml/min. Medications reviewed; 100 mg colace BID, 20 mg pepcid per OGT/day, 50 mg solu-cortef BID, sliding scale novolog, 17 g miralax/day, 250 mg IV thiamine BID.  Drips; argatroban @ 0.87 mcg/kg/min and precedex @ 0.9 mcg/kg/hr.   Diet Order:   Diet Order             Diet NPO time specified  Diet effective now                   EDUCATION NEEDS:   Not appropriate for education at this time  Skin:  Skin Assessment: Skin Integrity Issues: Skin Integrity Issues:: Stage II, DTI, Other (Comment) DTI: upper R + L face (newly documented 8/7) Stage II: mid- coccyx; R back (newly documented 8/7) Other: sloughing to L hand d/t hand-foot-and-mouth disease  Last BM:  8/9 (type 6 x1)  Height:  Ht Readings from Last 1 Encounters:  12/01/20 5' 8"  (1.727 m)    Weight:   Wt Readings from Last 1 Encounters:  12/16/20 103.4 kg     Estimated Nutritional Needs:  Kcal:  1540-1750 kcal Protein:  150-165 grams Fluid:  >/= 2.2 L/day     Jarome Matin, MS, RD, LDN, CNSC Inpatient Clinical Dietitian RD pager # available in AMION  After hours/weekend pager # available in [facility]

## 2020-12-16 NOTE — Progress Notes (Signed)
ANTICOAGULATION CONSULT NOTE - Follow Up Consult  Pharmacy Consult for Argatroban Indication: DVT and rule out HIT  No Known Allergies  Patient Measurements: Height: 5\' 8"  (172.7 cm) Weight: 103.4 kg (227 lb 15.3 oz) IBW/kg (Calculated) : 68.4 Heparin Dosing Weight:   Vital Signs: Temp: 98.7 F (37.1 C) (08/09 0400) Temp Source: Axillary (08/09 0400) BP: 118/68 (08/09 0700) Pulse Rate: 70 (08/09 0700)  Labs: Recent Labs    12/14/20 0400 12/14/20 0700 12/15/20 0409 12/15/20 1100 12/15/20 1437 12/15/20 1528 12/16/20 0417  HGB 9.6*  --  9.3*  --  16.3  --  8.8*  HCT 29.7*  --  28.9*  --  48.0  --  27.5*  PLT 104*  --  112*  --   --   --  113*  APTT  --    < > 49* 58*  --  63* 64*  CREATININE 1.49*   < > 1.44*  --   --  1.61* 1.57*   < > = values in this interval not displayed.     Estimated Creatinine Clearance: 55.4 mL/min (A) (by C-G formula based on SCr of 1.57 mg/dL (H)).   Medications:  Infusions:    prismasol BGK 4/2.5 400 mL/hr at 12/16/20 0450    prismasol BGK 4/2.5 200 mL/hr at 12/15/20 0549   sodium chloride 10 mL/hr at 12/16/20 0700   sodium chloride Stopped (12/12/20 1729)   anticoagulant sodium citrate     argatroban 0.87 mcg/kg/min (12/16/20 0700)   dexmedetomidine (PRECEDEX) IV infusion 0.6 mcg/kg/hr (12/16/20 0700)   levETIRAcetam Stopped (12/15/20 2144)   prismasol BGK 4/2.5 1,800 mL/hr at 12/16/20 0653   promethazine (PHENERGAN) injection (IM or IVPB)     thiamine injection Stopped (12/15/20 2329)    Assessment: 65 yom admitted on 7/24 with high ostomy output, sepsis vs chemotherapy reaction; developed respiratory failure, aspiration, AKI, ischemic colitis.  He was initially on prophylactic heparin, then apixaban for Afib, then pharmacy was consulted to dose heparin IV for afib, then stopped for bleeding on 8/2 (also back in NSR), but remained on heparin in CRRT circuit.  Pharmacy was consulted for Argatroban for DVT on 8/5 due to concern for  HITT.    - 8/5 HIT antb: 0.144, negative (0 - 0.400) - 8/5 SRA: in process  Today, 12/16/2020: - aPTT 64, therapeutic on argatroban 0.87 mcg/kg/hr - CBC: Hgb decreased slightly to 8.8, Plt improved to 113 - No bleeding or complications reported. - Extubated/re-intubated yesterday.  Remains on CRRT with no clotting issues reported.  Goal of Therapy:  aPTT 50-90 seconds Monitor platelets by anticoagulation protocol: Yes   Plan:  Continue Argatroban at 0.87 mcg/kg/min IV  Recheck aPTT q12h Daily CBC Follow up [place] PharmD, BCPS Clinical Pharmacist [facility] main pharmacy [phone] 12/16/2020 7:08 AM

## 2020-12-16 NOTE — Progress Notes (Signed)
ANTICOAGULATION CONSULT NOTE - Follow Up Consult  Pharmacy Consult for Heparin Indication: DVT  No Known Allergies  Patient Measurements: Height: 5\' 8"  (172.7 cm) Weight: 103.4 kg (227 lb 15.3 oz) IBW/kg (Calculated) : 68.4 Heparin Dosing Weight: 91 kg  Vital Signs: Temp: 99.7 F (37.6 C) (08/09 1200) Temp Source: Axillary (08/09 1200) BP: 133/74 (08/09 1400) Pulse Rate: 78 (08/09 1400)  Labs: Recent Labs    12/14/20 0400 12/14/20 0700 12/15/20 0409 12/15/20 1100 12/15/20 1437 12/15/20 1528 12/16/20 0417  HGB 9.6*  --  9.3*  --  16.3  --  8.8*  HCT 29.7*  --  28.9*  --  48.0  --  27.5*  PLT 104*  --  112*  --   --   --  113*  APTT  --    < > 49* 58*  --  63* 64*  CREATININE 1.49*   < > 1.44*  --   --  1.61* 1.57*   < > = values in this interval not displayed.     Estimated Creatinine Clearance: 55.4 mL/min (A) (by C-G formula based on SCr of 1.57 mg/dL (H)).   Medications:  Infusions:    prismasol BGK 4/2.5 400 mL/hr at 12/16/20 0450    prismasol BGK 4/2.5 200 mL/hr at 12/16/20 0809   sodium chloride Stopped (12/12/20 1729)   anticoagulant sodium citrate     argatroban 0.87 mcg/kg/min (12/16/20 1400)   dexmedetomidine (PRECEDEX) IV infusion 0.9 mcg/kg/hr (12/16/20 1400)   feeding supplement (VITAL AF 1.2 CAL) 1,000 mL (12/16/20 1407)   levETIRAcetam Stopped (12/16/20 0929)   prismasol BGK 4/2.5 1,800 mL/hr at 12/16/20 1245   promethazine (PHENERGAN) injection (IM or IVPB)     thiamine injection Stopped (12/16/20 1000)    Assessment: 31 yom admitted on 7/24 with high ostomy output, sepsis vs chemotherapy reaction; developed respiratory failure, aspiration, AKI, ischemic colitis.  He was initially on prophylactic heparin, then apixaban for Afib, then pharmacy was consulted to dose heparin IV for afib, then stopped for bleeding on 8/2 (also back in NSR), but remained on heparin in CRRT circuit.  Pharmacy was consulted for Argatroban for DVT on 8/5 due to concern  for HITT.  Pharmacy consulted on 8/9 to change back to heparin with negative HIT Ab and SRA.  - 8/5 HIT antb: 0.144, negative (0 - 0.400) - 8/5 SRA: negative  Today, 12/16/2020: - aPTT 64, therapeutic on argatroban 0.87 mcg/kg/hr - CBC: Hgb decreased slightly to 8.8, Plt improved to 113 - No bleeding or complications reported. - Extubated/re-intubated yesterday.  Remains on CRRT with no clotting issues reported.  Goal of Therapy:  aPTT 50-90 seconds Monitor platelets by anticoagulation protocol: Yes   Plan:  D/c Argatroban Start heparin IV infusion at 1450 units/hr Heparin level 6 hours after starting Daily heparin level and CBC   Gretta Arab PharmD, BCPS Clinical Pharmacist [facility] main pharmacy [phone] 12/16/2020 2:16 PM

## 2020-12-16 NOTE — Progress Notes (Signed)
Pt has remained anuric throughout this shift. Bladder scan performed per protocol and showed 0 mLs. Dr Jonnie Finner and CCM aware. This RN will continue to carefully monitor.

## 2020-12-16 NOTE — Progress Notes (Signed)
[facility] Progress Note  Subjective: down to 103.5 kg, I/O net neg 1.7 L yest.  BP's are wnl, no pressors  Vitals:   12/16/20 1400 12/16/20 1430 12/16/20 1500 12/16/20 1530  BP: 133/74 124/65 138/78 137/72  Pulse: 78 79 79 76  Resp: 16 (!) 28 15 15   Temp:      TempSrc:      SpO2: 100% 100% 100% 100%  Weight:      Height:        Exam: WVT:[id number] and sedated CVS: RRR Resp: occ rhonchi Abd: +BS, soft, NT/ND Ext: no edema bilat LE's       Assessment/ Plan: AKI/CKD stage IIIb - presumably ischemic ATN in setting of severe sepsis and MSOF. Remains oliguric.  CRRT initiated 7/31. Heparin stopped 12/12/20 after diagnosed with RLE DVT and started on argatroban.  L fem cath placed 12/07/20. Will dc CRRT today, let volume come up, please put in for transfer to [facility] for iHD.  Volume - anasarca resolved, has minimal RLE edema now and wt's are down - peak 118 > 107 > 103kg today. May be getting dry. DC CRRT and let volume come up. Acute hypoxic respiratory failure/VDRF - aspiration pneumonia.  SP course of  IV maxipime/vanc.  On vent. Right leg DVT - now on argatroban. Thrombocytopenia - presumably due to critical illness.  Hematology following and HIT panel pending.  Heparin stopped 12/12/20 after starting argatroban for RLE DVT.  Platelets improved slightly to 94.   Metabolic acidosis - nongap.  Currently on CRRT Atrial fibrillation -on amiodarone and rate controlled. Anemia of critical illness- transfuse prn. Acute metabolic encephalopathy - per CCM Hypophosphatemia - requiring IV repletion.  Continue to follow.      Rob Louvinia Cumbo 12/16/2020, 3:55 PM   Recent Labs  Lab 12/15/20 1437 12/15/20 1528 12/16/20 0417  K 4.8 4.5 4.8  BUN  --  37* 37*  CREATININE  --  1.61* 1.57*  CALCIUM  --  7.1* 7.3*  PHOS  --  3.1 3.3  HGB 16.3  --  8.8*    Inpatient medications:  albuterol  2.5 mg Nebulization QID   chlorhexidine gluconate (MEDLINE KIT)  15 mL Mouth Rinse BID    Chlorhexidine Gluconate Cloth  6 each Topical Daily   clonazepam  1 mg Per Tube TID   cloNIDine  0.1 mg Per Tube TID   docusate  100 mg Per Tube BID   famotidine  20 mg Per Tube Daily   feeding supplement (PROSource TF)  90 mL Per Tube QID   hydrocerin   Topical BID   insulin aspart  0-15 Units Subcutaneous Q4H   insulin detemir  10 Units Subcutaneous BID   mouth rinse  15 mL Mouth Rinse 10 times per day   OLANZapine  5 mg Per Tube QHS   polyethylene glycol  17 g Per Tube Daily     prismasol BGK 4/2.5 400 mL/hr at 12/16/20 0450    prismasol BGK 4/2.5 200 mL/hr at 12/16/20 0809   sodium chloride Stopped (12/12/20 1729)   anticoagulant sodium citrate     dexmedetomidine (PRECEDEX) IV infusion 0.9 mcg/kg/hr (12/16/20 1532)   feeding supplement (VITAL AF 1.2 CAL) 1,000 mL (12/16/20 1407)   heparin 1,450 Units/hr (12/16/20 1509)   levETIRAcetam Stopped (12/16/20 0929)   prismasol BGK 4/2.5 1,800 mL/hr at 12/16/20 1535   promethazine (PHENERGAN) injection (IM or IVPB)     thiamine injection Stopped (12/16/20 1000)   sodium chloride, acetaminophen, alteplase,  anticoagulant sodium citrate, dextrose, docusate, fentaNYL (SUBLIMAZE) injection, methocarbamol, promethazine (PHENERGAN) injection (IM or IVPB), sodium chloride, sodium chloride flush

## 2020-12-17 ENCOUNTER — Other Ambulatory Visit (HOSPITAL_COMMUNITY): Payer: Self-pay

## 2020-12-17 DIAGNOSIS — E1100 Type 2 diabetes mellitus with hyperosmolarity without nonketotic hyperglycemic-hyperosmolar coma (NKHHC): Secondary | ICD-10-CM | POA: Diagnosis not present

## 2020-12-17 LAB — CBC
HCT: 26.2 % — ABNORMAL LOW (ref 39.0–52.0)
Hemoglobin: 8.3 g/dL — ABNORMAL LOW (ref 13.0–17.0)
MCH: 29.2 pg (ref 26.0–34.0)
MCHC: 31.7 g/dL (ref 30.0–36.0)
MCV: 92.3 fL (ref 80.0–100.0)
Platelets: 125 10*3/uL — ABNORMAL LOW (ref 150–400)
RBC: 2.84 MIL/uL — ABNORMAL LOW (ref 4.22–5.81)
RDW: 23.8 % — ABNORMAL HIGH (ref 11.5–15.5)
WBC: 15.3 10*3/uL — ABNORMAL HIGH (ref 4.0–10.5)
nRBC: 0.1 % (ref 0.0–0.2)

## 2020-12-17 LAB — GLUCOSE, CAPILLARY
Glucose-Capillary: 164 mg/dL — ABNORMAL HIGH (ref 70–99)
Glucose-Capillary: 258 mg/dL — ABNORMAL HIGH (ref 70–99)
Glucose-Capillary: 261 mg/dL — ABNORMAL HIGH (ref 70–99)
Glucose-Capillary: 265 mg/dL — ABNORMAL HIGH (ref 70–99)
Glucose-Capillary: 270 mg/dL — ABNORMAL HIGH (ref 70–99)
Glucose-Capillary: 271 mg/dL — ABNORMAL HIGH (ref 70–99)
Glucose-Capillary: 297 mg/dL — ABNORMAL HIGH (ref 70–99)
Glucose-Capillary: 372 mg/dL — ABNORMAL HIGH (ref 70–99)

## 2020-12-17 LAB — HEPARIN LEVEL (UNFRACTIONATED)
Heparin Unfractionated: 1.03 IU/mL — ABNORMAL HIGH (ref 0.30–0.70)
Heparin Unfractionated: 0.82 IU/mL — ABNORMAL HIGH (ref 0.30–0.70)
Heparin Unfractionated: 1 IU/mL — ABNORMAL HIGH (ref 0.30–0.70)

## 2020-12-17 MED ORDER — HEPARIN (PORCINE) 25000 UT/250ML-% IV SOLN: 1050.0000 [IU]/h | INTRAVENOUS | Status: DC

## 2020-12-17 MED ORDER — HEPARIN (PORCINE) 25000 UT/250ML-% IV SOLN
850.0000 [IU]/h | INTRAVENOUS | Status: DC
  Administered 2020-12-17: 850 [IU]/h via INTRAVENOUS

## 2020-12-17 MED ORDER — HEPARIN (PORCINE) 25000 UT/250ML-% IV SOLN
1250.0000 [IU]/h | INTRAVENOUS | Status: DC
  Administered 2020-12-17 (×2): 1250 [IU]/h via INTRAVENOUS
  Filled 2020-12-17: qty 250

## 2020-12-17 NOTE — Progress Notes (Signed)
Patient is placed in wean mode. Tolerating well at this time. RT will continue to monitor

## 2020-12-17 NOTE — Progress Notes (Signed)
ANTICOAGULATION CONSULT NOTE - Follow Up Consult  Pharmacy Consult for Heparin Indication: DVT  No Known Allergies  Patient Measurements: Height: 5\' 8"  (172.7 cm) Weight: 106 kg (233 lb 11 oz) IBW/kg (Calculated) : 68.4 Heparin Dosing Weight: 91 kg  Vital Signs: Temp: 96.5 F (35.8 C) (08/10 0730) Temp Source: Axillary (08/10 0730) BP: 162/139 (08/10 0830) Pulse Rate: 69 (08/10 0830)  Labs: Recent Labs    12/15/20 0409 12/15/20 1100 12/15/20 1437 12/15/20 1528 12/16/20 0417 12/16/20 1709 12/16/20 2122 12/17/20 0355  HGB 9.3*  --  16.3  --  8.8*  --   --  8.3*  HCT 28.9*  --  48.0  --  27.5*  --   --  26.2*  PLT 112*  --   --   --  113*  --   --  125*  APTT 49*   < >  --  63* 64* 151*  --   --   HEPARINUNFRC  --   --   --   --   --   --  0.69 1.03*  CREATININE 1.44*  --   --  1.61* 1.57*  --   --   --    < > = values in this interval not displayed.     Estimated Creatinine Clearance: 56.1 mL/min (A) (by C-G formula based on SCr of 1.57 mg/dL (H)).   Medications:  Infusions:   sodium chloride 500 mL (12/17/20 0756)   dexmedetomidine (PRECEDEX) IV infusion 0.5 mcg/kg/hr (12/17/20 0700)   feeding supplement (VITAL AF 1.2 CAL) 1,000 mL (12/16/20 1407)   heparin 1,250 Units/hr (12/17/20 0700)   levETIRAcetam Stopped (12/16/20 2146)   promethazine (PHENERGAN) injection (IM or IVPB)     thiamine injection Stopped (12/16/20 2247)    Assessment: 65 yom admitted on 7/24 with high ostomy output, sepsis vs chemotherapy reaction; developed respiratory failure, aspiration, AKI, ischemic colitis.  He was initially on prophylactic heparin, then apixaban for Afib, then pharmacy was consulted to dose heparin IV for afib, then stopped for bleeding on 8/2 (also back in NSR), but remained on heparin in CRRT circuit.  Pharmacy was consulted for Argatroban for DVT on 8/5 due to concern for HITT.  Pharmacy consulted on 8/9 to change back to heparin with negative HIT Ab and SRA.  -  8/5 HIT antb: 0.144, negative (0 - 0.400) - 8/5 SRA: negative  Today, 12/17/2020: - Heparin level: 0.82 (drawn from port while heparin is infusing in the HD catheter), remains supratherapeutic despite previous hold and rate decrease - CBC: Hgb decreased slightly to 8.3, Plt improved to 125 - No bleeding or complications reported. - Extubated/re-intubated on 8/8, weaning on 8/10.  Off CRRT.    Goal of Therapy:  aPTT 50-90 seconds Monitor platelets by anticoagulation protocol: Yes   Plan:  Decrease to heparin IV infusion at 1050 units/hr Heparin level 6 hours after rate change Daily heparin level and CBC   Gretta Arab PharmD, BCPS Clinical Pharmacist [facility] main pharmacy [phone] 12/17/2020 12:22 PM

## 2020-12-17 NOTE — Progress Notes (Signed)
ANTICOAGULATION CONSULT NOTE - Follow Up Consult  Pharmacy Consult for Heparin Indication: DVT  No Known Allergies  Patient Measurements: Height: 5\' 8"  (172.7 cm) Weight: 106 kg (233 lb 11 oz) IBW/kg (Calculated) : 68.4 Heparin Dosing Weight: 91 kg  Vital Signs: Temp: 96.1 F (35.6 C) (08/10 1826) Temp Source: Axillary (08/10 1826) BP: 112/62 (08/10 1700) Pulse Rate: 60 (08/10 1807)  Labs: Recent Labs    12/15/20 0409 12/15/20 1100 12/15/20 1437 12/15/20 1528 12/16/20 0417 12/16/20 1709 12/16/20 2122 12/17/20 0355 12/17/20 1103 12/17/20 1844  HGB 9.3*  --  16.3  --  8.8*  --   --  8.3*  --   --   HCT 28.9*  --  48.0  --  27.5*  --   --  26.2*  --   --   PLT 112*  --   --   --  113*  --   --  125*  --   --   APTT 49*   < >  --  63* 64* 151*  --   --   --   --   HEPARINUNFRC  --   --   --   --   --   --    < > 1.03* 0.82* 1.00*  CREATININE 1.44*  --   --  1.61* 1.57*  --   --   --   --   --    < > = values in this interval not displayed.     Estimated Creatinine Clearance: 56.1 mL/min (A) (by C-G formula based on SCr of 1.57 mg/dL (H)).   Medications:  Infusions:   sodium chloride 250 mL (12/17/20 1842)   dexmedetomidine (PRECEDEX) IV infusion 1 mcg/kg/hr (12/17/20 1842)   feeding supplement (VITAL AF 1.2 CAL) 1,000 mL (12/17/20 1453)   heparin 1,050 Units/hr (12/17/20 1704)   levETIRAcetam Stopped (12/17/20 1117)   promethazine (PHENERGAN) injection (IM or IVPB)     thiamine injection Stopped (12/17/20 1016)    Assessment: 58 yom admitted on 7/24 with high ostomy output, sepsis vs chemotherapy reaction; developed respiratory failure, aspiration, AKI, ischemic colitis.  He was initially on prophylactic heparin, then apixaban for Afib, then pharmacy was consulted to dose heparin IV for afib, then stopped for bleeding on 8/2 (also back in NSR), but remained on heparin in CRRT circuit.  Pharmacy was consulted for Argatroban for DVT on 8/5 due to concern for HITT.   Pharmacy consulted on 8/9 to change back to heparin with negative HIT Ab and SRA.  - 8/5 HIT antb: 0.144, negative (0 - 0.400) - 8/5 SRA: negative  Heparin level remains elevated at 1.0, will hold and reduce rate.  Goal of Therapy:  aPTT 50-90 seconds Monitor platelets by anticoagulation protocol: Yes   Plan:  -Hold heparin x30 minutes then reduce to 850 units/h -Repeat heparin level in 8h  Arrie Senate, PharmD, [place], [place] Pharmacist [phone] Please check AMION for all [place] numbers 12/17/2020

## 2020-12-17 NOTE — Progress Notes (Addendum)
Did not wean well today  Back on PRVC -If unable to wean, will need tracheostomy -This possibility already discussed at length with patient's spouse   Patient to be transferred to [facility] for hemodialysis  Updated spouse and informed her about [facility]

## 2020-12-17 NOTE — Progress Notes (Signed)
[facility] Progress Note  Subjective: down to 103.5 kg, I/O net neg 1.7 L yest.  BP's are wnl, no pressors  Vitals:   12/17/20 1400 12/17/20 1430 12/17/20 1500 12/17/20 1515  BP: 122/69 114/69 105/62   Pulse: 60 60 60   Resp: _0 Temp:    (!) 96.8 F (36 C)  TempSrc:    Axillary  SpO2: 99% 99% 100%   Weight:      Height:        Exam: BWL:[id number] and sedated CVS: RRR Resp: occ rhonchi Abd: +BS, soft, NT/ND Ext: no edema bilat LE's       Assessment/ Plan: AKI/CKD stage IIIb - presumably ischemic ATN in setting of severe sepsis and MSOF. Creat 2.1 on admission and rose to peak 5.20 so CRRT initiated 7/31. Heparin stopped 12/12/20 after diagnosed with RLE DVT and started on argatroban.  L fem cath placed 12/07/20. CRRT on hold for now. Let volume come up, hoping for some sign of returning renal function. OK for transfer to [facility] for iHD now that volume is controlled and off of pressors.  Volume - anasarca resolved, minimal RLE edema now and wt's are down, may be a bit dry. CRRT on hold to let volume come up. Acute hypoxic respiratory failure/VDRF - aspiration pneumonia.  SP course of  IV maxipime/vanc.  On vent, unable to wean off at this time.  Right leg DVT - now on argatroban. Thrombocytopenia - presumably due to critical illness.  Hematology following and HIT panel pending.  Heparin stopped 12/12/20 after starting argatroban for RLE DVT.  Platelets improved slightly to 94.   Metabolic acidosis - nongap.  Currently on CRRT Atrial fibrillation -on amiodarone and rate controlled. Anemia of critical illness- transfuse prn. Acute metabolic encephalopathy - per CCM      Rob Vincenta Steffey 12/17/2020, 3:38 PM   Recent Labs  Lab 12/15/20 1528 12/16/20 0417 12/17/20 0355  K 4.5 4.8  --   BUN 37* 37*  --   CREATININE 1.61* 1.57*  --   CALCIUM 7.1* 7.3*  --   PHOS 3.1 3.3  --   HGB  --  8.8* 8.3*    Inpatient medications:  albuterol  2.5 mg Nebulization  QID   chlorhexidine gluconate (MEDLINE KIT)  15 mL Mouth Rinse BID   Chlorhexidine Gluconate Cloth  6 each Topical Daily   clonazepam  1 mg Per Tube TID   cloNIDine  0.1 mg Per Tube TID   docusate  100 mg Per Tube BID   famotidine  20 mg Per Tube Daily   feeding supplement (PROSource TF)  90 mL Per Tube QID   hydrocerin   Topical BID   insulin aspart  0-15 Units Subcutaneous Q4H   insulin detemir  10 Units Subcutaneous BID   mouth rinse  15 mL Mouth Rinse 10 times per day   OLANZapine  5 mg Per Tube QHS   polyethylene glycol  17 g Per Tube Daily    sodium chloride 10 mL/hr at 12/17/20 1444   dexmedetomidine (PRECEDEX) IV infusion 0.8 mcg/kg/hr (12/17/20 1453)   feeding supplement (VITAL AF 1.2 CAL) 1,000 mL (12/17/20 1453)   heparin 1,050 Units/hr (12/17/20 1444)   levETIRAcetam Stopped (12/17/20 1117)   promethazine (PHENERGAN) injection (IM or IVPB)     thiamine injection Stopped (12/17/20 1016)   sodium chloride, acetaminophen, dextrose, docusate, fentaNYL (SUBLIMAZE) injection, methocarbamol, promethazine (PHENERGAN) injection (IM or IVPB), sodium chloride flush

## 2020-12-17 NOTE — Progress Notes (Signed)
NAME:  Thomas [name], MRN:  [id number], DOB:  04-03-1956, LOS: 24 ADMISSION DATE:  11/30/2020, CONSULTATION DATE:  7/30 REFERRING MD:  Riccardo Dubin, CHIEF COMPLAINT:  Worsening hypoxia/dyspnea  History of Present Illness:  65 yo M admitted for worsening sepsis vs reaction to chemotherapy with high ostomy output and soft BP on 7/25. Has had issues during his hospitalization with recurrent aspirations and more recently worsening respiratory status.  Most recently pt developed worsening hypoxia and was requiring BIPAP w/ FIO2 of 100% and increased work of breathing. Unable to obtain ABG, CXR in AM of 7/30 showed some worsening concern of aspiration.   Who was admitted to intensive care and subsequently had worsening clinical status requiring intubation, CRRT, multiple pressors and pneumonia along with CT findings 8/1 concerning for ischemic bowel.  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  Hx of remote MCA stroke Asthma hx as a child Epilepsy on keppra GERD T2DM on metformin as outpt HTN HLD OSA  Significant Hospital Events:  7/27 Transferred to [facility], stopped abx as sepsis resolved/ruled out 7/30 AM Worsened AME, more hypoxic with worsening renal funciton, new aspiration on abx restarted 7/30 PM Pt with increased o2 requirement on 100% FIO2 on BIPAP, d/w family who wanted to proceed with intubation, pt intubated 8/1 Some issues with tachycardia and hypotension overnight. Reproted high ostomy output  8/2 down to 9 mcg of Levophed, however repeat CT abdomen/pelvis showed inflammatory wall thickening and fat stranding of the descending and sigmoid colon, new since prior, consistent with nonspecific infectious, inflammatory, or ischemic colitis.  Also new dense consolidative airspace opacity in the right lower and right middle lobes 8/3  further weaning NE. Weaning sedation. Full code, full scope per family meeting 8/2  8/4 off pressors, plt down to 103.  8/5 remains off pressors. Moving around more, not really following commands though  8/9-failed extubation 8/8, tolerated nasal cannula for about an hour, desaturations and inability to clear secretions-reintubated 8/8 some agitation overnight  Micro: Resp viral panel 7/24 > neg covid/ flu BC  7/24  x 2 neg  MRSA  7/25 neg  UC   7/25  neg  C diff screeen 7/26  neg ET culture 7/31 > moderate yeast> grew mod candida tropicalis GI panel  8/1> neg   Abx: Unasyn 7/30 >>7/31 Maxepime 7/31 >> 8/7 Flagyl 7/31 >>  8/7 Vancomycin 8/1 only  Anidulafungin 8/1>>8/3    Scheduled Meds:  albuterol  2.5 mg Nebulization QID   chlorhexidine gluconate (MEDLINE KIT)  15 mL Mouth Rinse BID   Chlorhexidine Gluconate Cloth  6 each Topical Daily   clonazepam  1 mg Per Tube TID   cloNIDine  0.1 mg Per Tube TID   docusate  100 mg Per Tube BID   famotidine  20 mg Per Tube Daily   feeding supplement (PROSource TF)  90 mL Per Tube QID   hydrocerin   Topical BID   insulin aspart  0-15 Units Subcutaneous Q4H   insulin detemir  10 Units Subcutaneous BID   mouth rinse  15 mL Mouth Rinse 10 times per day   OLANZapine  5 mg Per Tube QHS   polyethylene glycol  17 g Per Tube Daily   Continuous Infusions:  sodium chloride 500 mL (12/17/20 0756)   dexmedetomidine (PRECEDEX) IV infusion 0.5 mcg/kg/hr (12/17/20 0700)   feeding supplement (VITAL  AF 1.2 CAL) 1,000 mL (12/16/20 1407)   heparin 1,250 Units/hr (12/17/20 0700)   levETIRAcetam Stopped (12/16/20 2146)   promethazine (PHENERGAN) injection (IM or IVPB)     thiamine injection Stopped (12/16/20 2247)   PRN Meds:.sodium chloride, acetaminophen, dextrose, docusate, fentaNYL (SUBLIMAZE) injection, methocarbamol, promethazine (PHENERGAN) injection (IM or IVPB), sodium chloride flush    Interim History / Subjective:  Sedated No overnight  events He is awake, responsive  Objective   Blood pressure (!) 162/139, pulse 69, temperature (!) 96.5 F (35.8 C), temperature source Axillary, resp. rate (!) 29, height 5' 8" (1.727 m), weight 106 kg, SpO2 99 %.    Vent Mode: CPAP FiO2 (%):  [30 %-40 %] 30 % Set Rate:  [14 bmp] 14 bmp Vt Set:  [550 mL] 550 mL PEEP:  [5 cmH20] 5 cmH20 Pressure Support:  [10 cmH20] 10 cmH20 Plateau Pressure:  [14 cmH20-17 cmH20] 16 cmH20   Intake/Output Summary (Last 24 hours) at 12/17/2020 0856 Last data filed at 12/17/2020 0700 Gross per 24 hour  Intake 2315.99 ml  Output 1559.1 ml  Net 756.89 ml   Filed Weights   12/15/20 0521 12/16/20 0500 12/17/20 0349  Weight: 106.6 kg 103.4 kg 106 kg   Afebrile General appearance: Sedated Endotracheal tube in place, no JVD neck is supple Decreased air movement S1-S2 appreciated Bowel sounds appreciated  Labs reviewed H&H 8.3/26.2  Resolved Hospital Problem list   Shock  HHS Afib RVR  Lactic Acidosis    Assessment & Plan:   Altered mental status Metabolic encephalopathy History of CVA in 2015 History of seizure disorder -Continue with Keppra -Continue to wean as tolerated -Tolerating weaning this morning -Possible extubation if he continues to wean well  Acute hypoxemic respiratory failure Aspiration pneumonia -Continue pulmonary hygiene -Failed extubation 12/15/2020 -Reintubated for significant secretions -Weaning well this morning and will attempt extubation if he continues to tolerate weaning  Aspiration pneumonia Colitis -Completed antibiotics on 8/7 -Remains on tube feeds -Monitor ostomy output  AKI on chronic kidney disease stage IIIb -Management per nephrology -CRRT was held 12/16/2020  Stage IIIb transverse colon cancer S/p partial transverse colectomy, end colectomy On CAPOX -Has had 3 cycles 6/1, 6/22, 7/13 -He follows with Dr. Sherril  Heart failure with preserved ejection fraction -Last ejection fraction of 60  to 65% on last echo  Thrombocytopenia -Continue to monitor  Hypertension Hyperlipidemia Type 2 diabetes -Continue SSI  Capecitabine induced desquamation Hand/foot syndrome -Continue moisturizer to hands and feet and scrotum -Lesions does appear to be improving  Goals of care discussion 8/2 -Continue full scope of care  Will attempt extubation today Continue to follow closely  If fails extubation today, will require tracheostomy  Best Practice  Diet/type: tf per nutrition DVT prophylaxis: SCD/ argatroban GI prophylaxis: H2B Lines: Port, femoral trialysis,  Foley:  N/A Code Status:  full code Last date of goals of care discussion : 8/2 : PCCM Palli, RN, family -- full code full scope   The patient is critically ill with multiple organ systems failure and requires high complexity decision making for assessment and support, frequent evaluation and titration of therapies, application of advanced monitoring technologies and extensive interpretation of multiple databases. Critical Care Time devoted to patient care services described in this note independent of APP/resident time (if applicable)  is 35 minutes.     MD [place] [facility] Personal pager: See Amion If unanswered, please page CCM On-call: #[phone]   

## 2020-12-18 ENCOUNTER — Inpatient Hospital Stay (HOSPITAL_COMMUNITY): Payer: Medicare HMO

## 2020-12-18 DIAGNOSIS — E1100 Type 2 diabetes mellitus with hyperosmolarity without nonketotic hyperglycemic-hyperosmolar coma (NKHHC): Secondary | ICD-10-CM | POA: Diagnosis not present

## 2020-12-18 LAB — RENAL FUNCTION PANEL
Albumin: 1.7 g/dL — ABNORMAL LOW (ref 3.5–5.0)
Anion gap: 13 (ref 5–15)
BUN: 89 mg/dL — ABNORMAL HIGH (ref 8–23)
CO2: 19 mmol/L — ABNORMAL LOW (ref 22–32)
Calcium: 7.6 mg/dL — ABNORMAL LOW (ref 8.9–10.3)
Chloride: 107 mmol/L (ref 98–111)
Creatinine, Ser: 3.85 mg/dL — ABNORMAL HIGH (ref 0.61–1.24)
GFR, Estimated: 17 mL/min — ABNORMAL LOW (ref >60)
Glucose, Bld: 221 mg/dL — ABNORMAL HIGH (ref 70–99)
Phosphorus: 7.8 mg/dL — ABNORMAL HIGH (ref 2.5–4.6)
Potassium: 4.7 mmol/L (ref 3.5–5.1)
Sodium: 139 mmol/L (ref 135–145)

## 2020-12-18 LAB — CBC
HCT: 25.6 % — ABNORMAL LOW (ref 39.0–52.0)
Hemoglobin: 8.3 g/dL — ABNORMAL LOW (ref 13.0–17.0)
MCH: 29.2 pg (ref 26.0–34.0)
MCHC: 32.4 g/dL (ref 30.0–36.0)
MCV: 90.1 fL (ref 80.0–100.0)
Platelets: 144 10*3/uL — ABNORMAL LOW (ref 150–400)
RBC: 2.84 MIL/uL — ABNORMAL LOW (ref 4.22–5.81)
RDW: 23.2 % — ABNORMAL HIGH (ref 11.5–15.5)
WBC: 15.9 10*3/uL — ABNORMAL HIGH (ref 4.0–10.5)
nRBC: 0 % (ref 0.0–0.2)

## 2020-12-18 LAB — CBC WITH DIFFERENTIAL/PLATELET
Abs Immature Granulocytes: 0.36 10*3/uL — ABNORMAL HIGH (ref 0.00–0.07)
Basophils Absolute: 0 10*3/uL (ref 0.0–0.1)
Basophils Relative: 0 %
Eosinophils Absolute: 0.1 10*3/uL (ref 0.0–0.5)
Eosinophils Relative: 1 %
HCT: 25.8 % — ABNORMAL LOW (ref 39.0–52.0)
Hemoglobin: 8 g/dL — ABNORMAL LOW (ref 13.0–17.0)
Immature Granulocytes: 2 %
Lymphocytes Relative: 12 %
Lymphs Abs: 1.9 10*3/uL (ref 0.7–4.0)
MCH: 28.4 pg (ref 26.0–34.0)
MCHC: 31 g/dL (ref 30.0–36.0)
MCV: 91.5 fL (ref 80.0–100.0)
Monocytes Absolute: 1.2 10*3/uL — ABNORMAL HIGH (ref 0.1–1.0)
Monocytes Relative: 8 %
Neutro Abs: 12.1 10*3/uL — ABNORMAL HIGH (ref 1.7–7.7)
Neutrophils Relative %: 77 %
Platelets: 154 10*3/uL (ref 150–400)
RBC: 2.82 MIL/uL — ABNORMAL LOW (ref 4.22–5.81)
RDW: 23.1 % — ABNORMAL HIGH (ref 11.5–15.5)
WBC: 15.7 10*3/uL — ABNORMAL HIGH (ref 4.0–10.5)
nRBC: 0.1 % (ref 0.0–0.2)

## 2020-12-18 LAB — MAGNESIUM: Magnesium: 2.2 mg/dL (ref 1.7–2.4)

## 2020-12-18 LAB — GLUCOSE, CAPILLARY
Glucose-Capillary: 198 mg/dL — ABNORMAL HIGH (ref 70–99)
Glucose-Capillary: 226 mg/dL — ABNORMAL HIGH (ref 70–99)
Glucose-Capillary: 220 mg/dL — ABNORMAL HIGH (ref 70–99)
Glucose-Capillary: 146 mg/dL — ABNORMAL HIGH (ref 70–99)
Glucose-Capillary: 162 mg/dL — ABNORMAL HIGH (ref 70–99)
Glucose-Capillary: 145 mg/dL — ABNORMAL HIGH (ref 70–99)

## 2020-12-18 LAB — HEPARIN LEVEL (UNFRACTIONATED): Heparin Unfractionated: 0.49 IU/mL (ref 0.30–0.70)

## 2020-12-18 MED ORDER — OLANZAPINE 5 MG PO TBDP
5.0000 mg | ORAL_TABLET | Freq: Every day | ORAL | Status: DC
  Administered 2020-12-19 – 2020-12-20 (×2): 5 mg via ORAL
  Filled 2020-12-18 (×3): qty 1

## 2020-12-18 MED ORDER — FENTANYL 2500MCG IN NS 250ML (10MCG/ML) PREMIX INFUSION: 0.0000 ug/h | INTRAVENOUS | Status: DC

## 2020-12-18 MED ORDER — NOREPINEPHRINE 4 MG/250ML-% IV SOLN
INTRAVENOUS | Status: AC
  Administered 2020-12-18: 2 ug/min via INTRAVENOUS
  Filled 2020-12-18: qty 250

## 2020-12-18 MED ORDER — CHLORHEXIDINE GLUCONATE CLOTH 2 % EX PADS: 6.0000 | MEDICATED_PAD | Freq: Every day | CUTANEOUS | Status: DC

## 2020-12-18 MED ORDER — ATORVASTATIN CALCIUM 10 MG PO TABS: 20.0000 mg | ORAL_TABLET | Freq: Every day | ORAL | Status: DC

## 2020-12-18 MED ORDER — MIDAZOLAM HCL 2 MG/2ML IJ SOLN
5.0000 mg | Freq: Once | INTRAMUSCULAR | Status: AC
  Administered 2020-12-18: 5 mg via INTRAVENOUS
  Filled 2020-12-18: qty 6

## 2020-12-18 MED ORDER — FENTANYL CITRATE (PF) 100 MCG/2ML IJ SOLN
50.0000 ug | INTRAMUSCULAR | Status: DC | PRN
  Administered 2020-12-18 – 2020-12-30 (×44): 100 ug via INTRAVENOUS
  Filled 2020-12-18 (×42): qty 2

## 2020-12-18 MED ORDER — PROPOFOL 1000 MG/100ML IV EMUL
INTRAVENOUS | Status: AC
  Filled 2020-12-18: qty 100

## 2020-12-18 MED ORDER — INSULIN DETEMIR 100 UNIT/ML ~~LOC~~ SOLN
15.0000 [IU] | Freq: Two times a day (BID) | SUBCUTANEOUS | Status: DC
  Administered 2020-12-18: 15 [IU] via SUBCUTANEOUS
  Filled 2020-12-18 (×2): qty 0.15

## 2020-12-18 MED ORDER — INSULIN DETEMIR 100 UNIT/ML ~~LOC~~ SOLN
8.0000 [IU] | Freq: Two times a day (BID) | SUBCUTANEOUS | Status: DC
  Administered 2020-12-18 – 2020-12-19 (×3): 8 [IU] via SUBCUTANEOUS
  Filled 2020-12-18 (×5): qty 0.08

## 2020-12-18 MED ORDER — VECURONIUM BROMIDE 10 MG IV SOLR
10.0000 mg | Freq: Once | INTRAVENOUS | Status: AC
  Administered 2020-12-18: 10 mg via INTRAVENOUS
  Filled 2020-12-18: qty 10

## 2020-12-18 MED ORDER — NOREPINEPHRINE 4 MG/250ML-% IV SOLN
0.0000 ug/min | INTRAVENOUS | Status: DC
  Administered 2020-12-18: 1 ug/min via INTRAVENOUS
  Filled 2020-12-18: qty 250

## 2020-12-18 MED ORDER — FENTANYL CITRATE (PF) 100 MCG/2ML IJ SOLN
200.0000 ug | Freq: Once | INTRAMUSCULAR | Status: AC
  Administered 2020-12-18: 200 ug via INTRAVENOUS
  Filled 2020-12-18: qty 4

## 2020-12-18 MED ORDER — PROPOFOL 1000 MG/100ML IV EMUL
5.0000 ug/kg/min | INTRAVENOUS | Status: DC
Start: 2020-12-18 — End: 2020-12-21
  Administered 2020-12-18: 50 ug/kg/min via INTRAVENOUS
  Filled 2020-12-18 (×2): qty 100

## 2020-12-18 MED ORDER — ETOMIDATE 2 MG/ML IV SOLN
40.0000 mg | Freq: Once | INTRAVENOUS | Status: DC
Start: 2020-12-18 — End: 2020-12-23
  Filled 2020-12-18: qty 20

## 2020-12-18 MED ORDER — FENTANYL CITRATE (PF) 100 MCG/2ML IJ SOLN
INTRAMUSCULAR | Status: AC
  Administered 2020-12-18: 100 ug
  Filled 2020-12-18: qty 4

## 2020-12-18 MED ORDER — FENTANYL CITRATE (PF) 100 MCG/2ML IJ SOLN
200.0000 ug | Freq: Once | INTRAMUSCULAR | Status: AC
Start: 2020-12-18 — End: 2020-12-18
  Administered 2020-12-18: 200 ug via INTRAVENOUS

## 2020-12-18 MED ORDER — HEPARIN (PORCINE) 25000 UT/250ML-% IV SOLN
1250.0000 [IU]/h | INTRAVENOUS | Status: DC
  Administered 2020-12-18: 850 [IU]/h via INTRAVENOUS
  Administered 2020-12-19 – 2020-12-20 (×2): 950 [IU]/h via INTRAVENOUS
  Administered 2020-12-21: 1250 [IU]/h via INTRAVENOUS
  Filled 2020-12-18 (×4): qty 250

## 2020-12-18 MED ORDER — ATORVASTATIN CALCIUM 10 MG PO TABS
20.0000 mg | ORAL_TABLET | Freq: Every day | ORAL | Status: DC
  Administered 2020-12-20 – 2021-01-28 (×39): 20 mg
  Filled 2020-12-18 (×40): qty 2

## 2020-12-18 MED ORDER — ORAL CARE MOUTH RINSE
15.0000 mL | Freq: Four times a day (QID) | OROMUCOSAL | Status: DC
  Administered 2020-12-19 – 2021-01-01 (×55): 15 mL via OROMUCOSAL

## 2020-12-18 NOTE — Progress Notes (Signed)
Nutrition Follow-up  DOCUMENTATION CODES:   Non-severe (moderate) malnutrition in context of chronic illness, Obesity unspecified  INTERVENTION:   Add PO supplements when diet advanced (Nepro Shake po TID or Boost Breeze po TID). If unable to safely advance PO diet by 8/12, recommend placing Cortrak tube for enteral nutrition support (Vital 1.5 at 65 ml/h with Prosource TF 45 ml BID would provide 2420 kcal, 127 gm protein, 1192 ml free water daily).  NUTRITION DIAGNOSIS:   Moderate Malnutrition related to chronic illness, cancer and cancer related treatments as evidenced by mild fat depletion, mild muscle depletion, percent weight loss.  Ongoing  GOAL:   Patient will meet greater than or equal to 90% of their needs  Unmet  MONITOR:   Vent status, TF tolerance, Labs, Weight trends, Skin, I & O's  REASON FOR ASSESSMENT:   Consult Enteral/tube feeding initiation and management  ASSESSMENT:   65 y.o. male who is a former with medical history of stage 3 colon cancer s/p partial colectomy and end colostomy on 5/4, recent chemo start with 3rd cycle on 7/22, sleep apnea, seizures, epilepsy, brain tumor, HTN, HLD, GERD, asthma, peripheral neuropathy, urinary frequency, DM, MCA stroke. He presented to the ED via EMS due to abdominal pain x2 weeks, anorexia, and weakness. Admitted for close monitoring for concern of mesenteric ischemia. Patient reported that abdominal pain was worse with eating PTA.  Discussed patient in ICU rounds and with RN today. Patient transferred to [facility] on 8/10 for trach placement and iHD. S/P tracheostomy this afternoon. Remains NPO with no enteral access. SLP following for ability to complete swallow evaluation. If unable to safely swallow, will need to place Cortrak Friday. Current TF orders: Vital AF 1.2 at 45 ml/h with Prosource TF 90 ml QID (not receiving d/t no enteral access). CRRT off, plans for next iHD treatment 8/12.  Labs reviewed. Phos 7.8 CBG:  226-220  Medications reviewed and include colace, pepcid, novolog, levemir, miralax, precedex, keppra, levophed, thiamine.  Admission weight 111.8 kg Current weight 106 kg Lowest weight since admission 103.4 kg (8/9)  Colostomy output 1150 ml x 24 hours I/O -1.26 L since admission Anuric  Diet Order:   Diet Order             Diet NPO time specified  Diet effective midnight           Diet NPO time specified  Diet effective now                   EDUCATION NEEDS:   Not appropriate for education at this time  Skin:  Skin Assessment: Skin Integrity Issues: Skin Integrity Issues:: Stage II, DTI, Other (Comment) DTI: upper R + L face (newly documented 8/7) Stage II: mid- coccyx; R back (newly documented 8/7) Other: sloughing to L hand d/t hand-foot-and-mouth disease  Last BM:  8/11 colostomy  Height:   Ht Readings from Last 1 Encounters:  12/01/20 5\' 8"  (1.727 m)    Weight:   Wt Readings from Last 1 Encounters:  12/17/20 106 kg    Ideal Body Weight:  70 kg  BMI:  Body mass index is 35.53 kg/m.  Estimated Nutritional Needs:   Kcal:  9476-5465  Protein:  120-140 gm  Fluid:  1 L + UOP   Lucas Mallow, RD, LDN, CNSC Please refer to Amion for contact information.

## 2020-12-18 NOTE — Progress Notes (Signed)
Called and updated wife post trach. Okay to continue heparin drip around 6PM. Usual post trach bundle. Hopefully can wean to [facility] which opens up disposition.  Erskine Emery MD PCCM

## 2020-12-18 NOTE — Progress Notes (Signed)
SLP Cancellation Note  Patient Details Name: SOTERO BRINKMEYER MRN: [id number] DOB: 1956-04-14   Cancelled treatment:       Reason Eval/Treat Not Completed: Medical issues which prohibited therapy. Pt extubated this am, orders received for swallow assessment, however plan is to proceed with tracheotomy this afternoon.  Our service will follow for readiness.  Kloe Oates L. Tivis Ringer, Altha Office number [phone] Pager [phone]    Assunta Curtis 12/18/2020, 12:13 PM

## 2020-12-18 NOTE — Progress Notes (Signed)
RT NOTE: Patient resting comfortably on CPAP/PSV of 10/5 this AM.  Will continue to monitor.

## 2020-12-18 NOTE — Progress Notes (Addendum)
HEMATOLOGY-ONCOLOGY PROGRESS NOTE  SUBJECTIVE: Undergoing bedside tracheostomy at the time my visit.  Oncology History  Colon cancer ([place])  09/23/2020 Initial Diagnosis   Colon cancer ([place])   09/23/2020 Cancer Staging   Staging form: Colon and Rectum, AJCC 8th Edition - Pathologic: Stage IIIB (pT3, pN1a, cM0) - Signed by Ladell Pier, MD on 09/23/2020 Total positive nodes: 1 Histologic grading system: 4 grade system Histologic grade (G): G1 Residual tumor (R): R0 - None Lymph-vascular invasion (LVI): LVI not present (absent)/not identified Tumor deposits (TD): Absent Perineural invasion (PNI): Absent Microsatellite instability (MSI): Stable   10/08/2020 -  Chemotherapy    Patient is on Treatment Plan: COLORECTAL XELOX (CAPEOX) Q21D         PHYSICAL EXAMINATION:  Vitals:   12/18/20 0804 12/18/20 0840  BP:  104/65  Pulse:  77  Resp:  19  Temp: 98.2 F (36.8 C)   SpO2:  95%   Filed Weights   12/15/20 0521 12/16/20 0500 12/17/20 0349  Weight: 106.6 kg 103.4 kg 106 kg    Intake/Output from previous day: 08/10 0701 - 08/11 0700 In: 2092.4 [I.V.:965.6; NG/GT:826.9; IV Piggyback:299.8] Out: 1150 [Stool:1150]  GENERAL: intubated, sedated SKIN: Dryness of the palms and soles, erythema with superficial desquamation at the hands and several fingers, superficial skin breakdown at the scrotum-improved, hyperpigmentation and skin thickening at the hands  NEURO: Sedated  Port-A-Cath without erythema  LABORATORY DATA:  I have reviewed the data as listed CMP Latest Ref Rng & Units 12/18/2020 12/16/2020 12/15/2020  Glucose 70 - 99 mg/dL 221(H) 315(H) 234(H)  BUN 8 - 23 mg/dL 89(H) 37(H) 37(H)  Creatinine 0.61 - 1.24 mg/dL 3.85(H) 1.57(H) 1.61(H)  Sodium 135 - 145 mmol/L 139 136 138  Potassium 3.5 - 5.1 mmol/L 4.7 4.8 4.5  Chloride 98 - 111 mmol/L 107 102 103  CO2 22 - 32 mmol/L 19(L) 26 25  Calcium 8.9 - 10.3 mg/dL 7.6(L) 7.3(L) 7.1(L)  Total Protein 6.5 - 8.1 g/dL - - -   Total Bilirubin 0.3 - 1.2 mg/dL - - -  Alkaline Phos 38 - 126 U/L - - -  AST 15 - 41 U/L - - -  ALT 0 - 44 U/L - - -    Lab Results  Component Value Date   WBC 15.7 (H) 12/18/2020   WBC 15.9 (H) 12/18/2020   HGB 8.0 (L) 12/18/2020   HGB 8.3 (L) 12/18/2020   HCT 25.8 (L) 12/18/2020   HCT 25.6 (L) 12/18/2020   MCV 91.5 12/18/2020   MCV 90.1 12/18/2020   PLT 154 12/18/2020   PLT 144 (L) 12/18/2020   NEUTROABS 12.1 (H) 12/18/2020    CT ABDOMEN PELVIS WO CONTRAST  Result Date: 12/08/2020 CLINICAL DATA:  Abdominal distension, decreasing colostomy output, history of colon cancer status post transverse colectomy EXAM: CT ABDOMEN AND PELVIS WITHOUT CONTRAST TECHNIQUE: Multidetector CT imaging of the abdomen and pelvis was performed following the standard protocol without IV contrast. Oral enteric contrast was administered COMPARISON:  CT chest abdomen pelvis, 12/01/2020 FINDINGS: Lower chest: There is new, dense heterogeneous and consolidative airspace opacity of the right lower and right middle lobes (series 7, image 1). Coronary artery calcifications. Aortic valve calcifications Hepatobiliary: No solid liver abnormality is seen. Hepatic steatosis. No gallstones, gallbladder wall thickening, or biliary dilatation. Pancreas: Unremarkable. No pancreatic ductal dilatation or surrounding inflammatory changes. Spleen: Normal in size without significant abnormality. Adrenals/Urinary Tract: Adrenal glands are unremarkable. Kidneys are normal, without renal calculi, solid lesion, or hydronephrosis. Bladder  is unremarkable. Stomach/Bowel: Esophagogastric tube is position with tip and side port below the diaphragm, however folded in the gastric body, with tip in the gastric fundus (series 2, image 17). 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 The proximal colon as well as the excluded colonic remnant are fluid-filled, and there is inflammatory wall thickening and fat stranding  about the descending and sigmoid colon (series 2, image 78). Vascular/Lymphatic: Left femoral venous and arterial catheters. Aortic atherosclerosis. Incidental note of retroaortic left renal vein. No enlarged abdominal or pelvic lymph nodes. Reproductive: No mass or other significant abnormality. Other: Mild anasarca. Fat containing left inguinal hernia. No abdominopelvic ascites. Musculoskeletal: No acute or significant osseous findings. IMPRESSION: 1. 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 2. The proximal colon as well as the excluded colonic remnant are fluid-filled, and there is inflammatory wall thickening and fat stranding about the descending and sigmoid colon. Findings are new compared to prior CT dated 12/01/2020, consistent with nonspecific infectious, inflammatory, or ischemic colitis. 3. There is new, dense heterogeneous and consolidative airspace opacity of the right lower and right middle lobes, consistent with infection or aspiration. 4. Esophagogastric tube is positioned with tip and side port below the diaphragm, however folded in the gastric body, with tip in the gastric fundus. Recommend repositioning. 5. Hepatic steatosis. 6. Coronary artery disease. Aortic Atherosclerosis (ICD10-I70.0). Electronically Signed   By: Eddie Candle M.D.   On: 12/08/2020 17:45   DG Chest 1 View  Result Date: 12/15/2020 CLINICAL DATA:  Intubation. EXAM: CHEST  1 VIEW COMPARISON:  12/15/2020 and CT chest 12/01/2020. FINDINGS: Endotracheal tube terminates approximately 3.7 cm above the carina. Right IJ power port tip is in the low SVC. Heart size stable. Possible mild right infrahilar atelectasis. No pleural fluid. IMPRESSION: 1. Endotracheal tube terminates approximately 3.7 cm above the carina. 2. There may be mild right infrahilar atelectasis. Electronically Signed   By: Lorin Picket M.D.   On: 12/15/2020 13:20   DG Chest 1 View  Result Date: 12/06/2020 CLINICAL DATA:   Shortness of breath. Patient undergoing chemotherapy for stage IIIB colon cancer. EXAM: CHEST  1 VIEW COMPARISON:  December 04, 2020 FINDINGS: Platelike opacity in the left base consistent with atelectasis or scar, stable. New opacity in the right base obscuring the elevated right hemidiaphragm. The heart, hila, and mediastinum are unchanged and unremarkable. The right Port-A-Cath is stable. No pulmonary nodules or masses. No other abnormalities. IMPRESSION: 1. Stable platelike opacity in the left base consistent with scar or atelectasis. 2. New opacity in the right base obscuring the elevated right hemidiaphragm. The opacity could represent atelectasis versus developing infiltrate. Recommend clinical correlation and short-term follow-up imaging to ensure resolution. Electronically Signed   By: Dorise Bullion III M.D   On: 12/06/2020 11:50   DG Abd 1 View  Result Date: 12/15/2020 CLINICAL DATA:  OG tube placement EXAM: ABDOMEN - 1 VIEW COMPARISON:  12/06/2020 FINDINGS: Enteric tube tip is overlying the distal stomach. Bowel gas pattern is not well evaluated. IMPRESSION: Enteric tube within the distal stomach. Electronically Signed   By: Macy Mis M.D.   On: 12/15/2020 13:35   DG Abd 1 View  Result Date: 12/06/2020 CLINICAL DATA:  NG tube placement. EXAM: ABDOMEN - 1 VIEW COMPARISON:  CT 12/01/2020 FINDINGS: Tip and side port of the enteric tube below the diaphragm in the stomach. Generalized paucity of bowel gas in the upper abdomen. IMPRESSION: Tip and side port of the enteric tube below  the diaphragm in the stomach. Electronically Signed   By: Keith Rake M.D.   On: 12/06/2020 23:37   CT HEAD WO CONTRAST  Result Date: 12/05/2020 CLINICAL DATA:  Acute neuro deficit, agitation and delirium, history of colon carcinoma EXAM: CT HEAD WITHOUT CONTRAST TECHNIQUE: Contiguous axial images were obtained from the base of the skull through the vertex without intravenous contrast. COMPARISON:  09/08/2016 and  previous FINDINGS: Brain: Previous left frontal craniotomy. Stable encephalomalacia in left frontal lobe. Negative for acute intracranial hemorrhage, midline shift, no parenchymal edema, mass or mass effect. No hydrocephalus. Mild diffuse parenchymal atrophy as before. Vascular: Atherosclerotic and physiologic intracranial calcifications. Skull: Changes of left temporal craniotomy. No acute fracture or worrisome bone lesion. Sinuses/Orbits: No acute finding. Other: None IMPRESSION: 1. Negative for bleed or other acute intracranial process. 2. Stable changes of left frontal craniotomy Electronically Signed   By: Lucrezia Europe M.D.   On: 12/05/2020 11:34   US RENAL  Result Date: 12/06/2020 CLINICAL DATA:  AK I EXAM: RENAL / URINARY TRACT ULTRASOUND COMPLETE COMPARISON:  Renal ultrasound 10/02/2020 FINDINGS: Right Kidney: Renal measurements: 9.8 x 6.3 x 5.9 cm = volume: 190 mL. Echogenicity within normal limits. No mass or hydronephrosis visualized. Left Kidney: Renal measurements: 11.9 x 7.2 x 5.8 cm = volume: 262 mL. Echogenicity within normal limits. No hydronephrosis. There is a cyst in the lateral aspect of the kidney measuring 4.5 x 4.0 x 4.0 cm. Bladder: Appears decompressed with Foley in place. Other: None. IMPRESSION: No acute abnormality sonographically in the kidneys. Exam is limited by difficulty with patient positioning and shadowing bowel gas. Electronically Signed   By: Audie Pinto M.D.   On: 12/06/2020 11:47   DG Chest Port 1 View  Result Date: 12/15/2020 CLINICAL DATA:  Acute respiratory failure with hypoxemia. EXAM: PORTABLE CHEST 1 VIEW COMPARISON:  12/12/2020 FINDINGS: Endotracheal tube is 4.2 cm above the carina. Right jugular central line tip is at the superior cavoatrial junction. No focal lung disease. Negative for a pneumothorax. Heart size is normal. Nasogastric tube extends into the abdomen and coiled in the proximal stomach region. IMPRESSION: 1. Endotracheal tube is appropriately  positioned. 2. No focal lung disease. Electronically Signed   By: Markus Daft M.D.   On: 12/15/2020 07:33   DG CHEST PORT 1 VIEW  Result Date: 12/12/2020 CLINICAL DATA:  Endotracheal tube position EXAM: PORTABLE CHEST 1 VIEW COMPARISON:  Chest radiograph 12/09/2020 FINDINGS: The endotracheal tube tip is in the midthoracic trachea. A right chest wall port is unchanged. The enteric catheter tip is in the stomach. The cardiomediastinal silhouette is stable. There is no focal consolidation or pulmonary edema. There is no pleural effusion or pneumothorax. The bones are stable. IMPRESSION: Endotracheal tube tip in the midthoracic trachea. Electronically Signed   By: Valetta Mole MD   On: 12/12/2020 08:09   DG CHEST PORT 1 VIEW  Result Date: 12/09/2020 CLINICAL DATA:  65 year old male, evaluate for pneumonia. EXAM: PORTABLE CHEST - 1 VIEW COMPARISON:  12/07/2020 FINDINGS: The patient is rotated to the right, limiting of diagnostic evaluation. Unchanged appearance of right chest wall, internal jugular vein approach Port-A-Cath with the catheter tip in the superior right atrium. Endotracheal tube in place with the tip in the inferior thoracic trachea, just above the carina. Gastric decompression tube courses off the inferior aspect of this image, the tip is visualized in the fundus. The mediastinal contours are obscured, though similar to comparison limits. No cardiomegaly. Hazy opacities in the right lung  base, limited evaluation due to projection. No acute osseous abnormality. IMPRESSION: 1. Right lower lobe subsegmental atelectasis versus infiltrate. Limited evaluation due to patient rotation. Recommend attention on follow-up. 2. Support lines and tubes in unchanged, appropriate positions. Electronically Signed   By: Ruthann Cancer MD   On: 12/09/2020 11:15   DG Chest Port 1 View  Result Date: 12/07/2020 CLINICAL DATA:  Oxygen desaturation. EXAM: PORTABLE CHEST 1 VIEW COMPARISON:  Radiograph yesterday.  CT  12/01/2020 FINDINGS: Endotracheal tube tip 2 cm from the carina. Enteric tube tip below the diaphragm not included in the field of view. Right chest port remains in place. Improved aeration of the right lung base with decreased volume loss. Residual patchy basilar opacity. The heart is normal in size. No pulmonary edema, large pleural effusion or pneumothorax. IMPRESSION: 1. Improved aeration of the right lung base with decreased volume loss since radiograph yesterday. Residual patchy right basilar opacity. 2. Stable support apparatus. Electronically Signed   By: Keith Rake M.D.   On: 12/07/2020 22:12   DG CHEST PORT 1 VIEW  Result Date: 12/06/2020 CLINICAL DATA:  Intubation. EXAM: PORTABLE CHEST 1 VIEW COMPARISON:  Radiograph earlier today.  CT 12/01/2020 FINDINGS: Endotracheal tube tip 19 mm from the carina. Enteric tube in place with tip and side-port below the diaphragm. Right chest port unchanged in position. There is progressive volume loss in the right hemithorax with opacification of the lower thorax and rightward mediastinal shift. Improved linear opacity at the left lung base. No pneumothorax. IMPRESSION: 1. Endotracheal tube tip 19 mm from the carina. Enteric tube in place. 2. Progressive volume loss in the right hemithorax with opacification of the lower thorax and rightward mediastinal shift. Findings favor lobar collapse/atelectasis. Linear opacity at the left lung base is improved from earlier today. Electronically Signed   By: Keith Rake M.D.   On: 12/06/2020 23:40   DG CHEST PORT 1 VIEW  Result Date: 12/04/2020 CLINICAL DATA:  Vomiting with possible aspiration. EXAM: PORTABLE CHEST 1 VIEW COMPARISON:  November 30, 2020 FINDINGS: There is stable right-sided venous Port-A-Cath positioning. Mild linear atelectasis is seen within the mid left lung. This represents a new finding when compared to the prior exam. There is no evidence of a pleural effusion or pneumothorax. The heart size and  mediastinal contours are within normal limits. The visualized skeletal structures are unremarkable. IMPRESSION: Interval development of mild linear atelectasis within the mid left lung since the prior study. Electronically Signed   By: Virgina Norfolk M.D.   On: 12/04/2020 01:19   DG Chest Port 1 View  Result Date: 11/30/2020 CLINICAL DATA:  Questionable sepsis. Patient began treatment for colon cancer 3 months ago. Loss of appetite, severe abdominal pain, weakness, and back pain. EXAM: PORTABLE CHEST 1 VIEW COMPARISON:  11/27/2020 FINDINGS: Heart size and pulmonary vascularity are normal. Lungs clear. Power port type central venous catheter with tip over the low SVC region. No pneumothorax. No pleural effusions. Mediastinal contours appear intact. IMPRESSION: No active disease. Electronically Signed   By: Lucienne Capers M.D.   On: 11/30/2020 22:36   DG Chest Port 1 View  Result Date: 11/27/2020 CLINICAL DATA:  Weakness.  Fall today. EXAM: PORTABLE CHEST 1 VIEW COMPARISON:  Radiograph 11/15/2020 FINDINGS: Right chest port unchanged in position. Normal heart size and mediastinal contours. No acute or focal airspace disease no pleural fluid, pulmonary edema, or pneumothorax. No acute osseous abnormalities are seen. IMPRESSION: No acute chest findings. Electronically Signed   By: Threasa Beards  Sanford M.D.   On: 11/27/2020 22:09   EEG adult  Result Date: 12/05/2020 Lora Havens, MD     12/05/2020  6:19 PM Patient Name: Thomas [name] MRN: [id number] Epilepsy Attending: Lora Havens Referring Physician/Provider: Dr Sander Radon Date: 12/05/2020 Duration: 23.18 mins Patient history: 65yo M withh/o epilepsy presented with ams. EEG to evaluate for seizure Level of alertness: Awake AEDs during EEG study: LEV Technical aspects: This EEG study was done with scalp electrodes positioned according to the 10-20 International system of electrode placement. Electrical activity was acquired at a sampling rate of  _0  and reviewed with a high frequency filter of _1  and a low frequency filter of _2 . EEG data were recorded continuously and digitally stored. Description: No posterior dominant rhythm was seen. EEG showed continuous generalized 3 to 6 Hz theta-delta slowing. There is also 3-_3  sharply contoured theta-delta slowing with overriding 13-_4  beta activity in left frontotemporal region consistent with breach artifact.  Hyperventilation and photic stimulation were not performed.   ABNORMALITY - Continuous slow, generalized - Breach artifact, left frontotemporal region IMPRESSION: This study is suggestive of cortical dysfunction arising from left frontotemporal region likely secondary to underlying encephalomalacia as well as prior craniotomy. There is also moderate diffuse encephalopathy, nonspecific etiology. No seizures or definite epileptiform discharges were seen throughout the recording. Lora Havens   ECHOCARDIOGRAM COMPLETE  Result Date: 12/02/2020    ECHOCARDIOGRAM REPORT   Patient Name:   Thomas [name] Date of Exam: 12/02/2020 Medical Rec #:  [id number]    Height:       68.0 in Accession #:    [id number]   Weight:       246.5 lb Date of Birth:  [birth year]    BSA:          2.234 m Patient Age:    48 years     BP:           136/77 mmHg Patient Gender: M            HR:           92 bpm. Exam Location:  Inpatient Procedure: 2D Echo, Color Doppler and Cardiac Doppler Indications:    I48.91* Unspecified atrial fibrillation  History:        Patient has no prior history of Echocardiogram examinations.                 Arrythmias:Atrial Fibrillation; Risk Factors:Hypertension,                 Diabetes, Dyslipidemia and Sleep Apnea.  Sonographer:    Raquel Sarna Senior RDCS Referring Phys: [phone] [facility]  Sonographer Comments: Technically difficult due to patient body habitus. IMPRESSIONS  1. Mild intracavitary gradient. Peak velocity 1.37 m/s. Peak gradient 7.5 mmHg. Left ventricular ejection fraction, by  estimation, is 60 to 65%. The left ventricle has normal function. The left ventricle has no regional wall motion abnormalities. There is moderate concentric left ventricular hypertrophy. Left ventricular diastolic parameters are consistent with Grade I diastolic dysfunction (impaired relaxation).  2. Right ventricular systolic function is normal. The right ventricular size is normal. There is normal pulmonary artery systolic pressure.  3. The mitral valve is normal in structure. No evidence of mitral valve regurgitation. No evidence of mitral stenosis.  4. The aortic valve is calcified. There is moderate calcification of the aortic valve. Aortic valve regurgitation is not visualized. No aortic stenosis is present.  5. The inferior vena cava is normal in size  with greater than 50% respiratory variability, suggesting right atrial pressure of 3 mmHg. FINDINGS  Left Ventricle: Mild intracavitary gradient. Peak velocity 1.37 m/s. Peak gradient 7.5 mmHg. Left ventricular ejection fraction, by estimation, is 60 to 65%. The left ventricle has normal function. The left ventricle has no regional wall motion abnormalities. The left ventricular internal cavity size was normal in size. There is moderate concentric left ventricular hypertrophy. Left ventricular diastolic parameters are consistent with Grade I diastolic dysfunction (impaired relaxation). Indeterminate filling pressures. Right Ventricle: The right ventricular size is normal. No increase in right ventricular wall thickness. Right ventricular systolic function is normal. There is normal pulmonary artery systolic pressure. The tricuspid regurgitant velocity is 1.72 m/s, and  with an assumed right atrial pressure of 8 mmHg, the estimated right ventricular systolic pressure is 62.6 mmHg. Left Atrium: Left atrial size was normal in size. Right Atrium: Right atrial size was normal in size. Pericardium: There is no evidence of pericardial effusion. Mitral Valve: The mitral  valve is normal in structure. No evidence of mitral valve regurgitation. No evidence of mitral valve stenosis. Tricuspid Valve: The tricuspid valve is normal in structure. Tricuspid valve regurgitation is trivial. No evidence of tricuspid stenosis. Aortic Valve: The aortic valve is calcified. There is moderate calcification of the aortic valve. Aortic valve regurgitation is not visualized. No aortic stenosis is present. Pulmonic Valve: The pulmonic valve was normal in structure. Pulmonic valve regurgitation is not visualized. No evidence of pulmonic stenosis. Aorta: The aortic root is normal in size and structure. Venous: The inferior vena cava is normal in size with greater than 50% respiratory variability, suggesting right atrial pressure of 3 mmHg. IAS/Shunts: No atrial level shunt detected by color flow Doppler.  LEFT VENTRICLE PLAX 2D LVIDd:         3.20 cm  Diastology LVIDs:         2.10 cm  LV e' medial:    4.68 cm/s LV PW:         1.30 cm  LV E/e' medial:  11.5 LV IVS:        1.50 cm  LV e' lateral:   6.85 cm/s LVOT diam:     2.00 cm  LV E/e' lateral: 7.9 LV SV:         43 LV SV Index:   19 LVOT Area:     3.14 cm  RIGHT VENTRICLE RV S prime:     13.50 cm/s TAPSE (M-mode): 1.6 cm LEFT ATRIUM             Index       RIGHT ATRIUM           Index LA diam:        3.20 cm 1.43 cm/m  RA Area:     12.50 cm LA Vol (A2C):   46.5 ml 20.82 ml/m RA Volume:   26.60 ml  11.91 ml/m LA Vol (A4C):   33.6 ml 15.04 ml/m LA Biplane Vol: 41.5 ml 18.58 ml/m  AORTIC VALVE LVOT Vmax:   98.65 cm/s LVOT Vmean:  79.600 cm/s LVOT VTI:    0.138 m  AORTA Ao Root diam: 3.10 cm MITRAL VALVE                TRICUSPID VALVE MV Area (PHT): 3.10 cm     TR Peak grad:   11.8 mmHg MV Decel Time: 245 msec     TR Vmax:        172.00 cm/s MV E  velocity: 54.00 cm/s MV A velocity: 102.00 cm/s  SHUNTS MV E/A ratio:  0.53         Systemic VTI:  0.14 m                             Systemic Diam: 2.00 cm Skeet Latch MD Electronically signed by  Skeet Latch MD Signature Date/Time: 12/02/2020/5:47:16 PM    Final    VAS [place] LOWER EXTREMITY VENOUS (DVT)  Result Date: 12/12/2020  Lower Venous DVT Study Patient Name:  Thomas [name]  Date of Exam:   12/12/2020 Medical Rec #: [id number]     Accession #:    [id number] Date of Birth: 10/23/1955     Patient Gender: M Patient Age:   89 years Exam Location:  [facility] Procedure:      VAS [place] LOWER EXTREMITY VENOUS (DVT) Referring Phys: MURALI RAMASWAMY --------------------------------------------------------------------------------  Indications: Edema.  Limitations: Body habitus, poor ultrasound/tissue interface and continuous HD to left groin. Comparison Study: Previous exam 1.5.2013 negative for DVT Performing Technologist: Jody Hill RVT, RDMS  Examination Guidelines: A complete evaluation includes B-mode imaging, spectral Doppler, color Doppler, and power Doppler as needed of all accessible portions of each vessel. Bilateral testing is considered an integral part of a complete examination. Limited examinations for reoccurring indications may be performed as noted. The reflux portion of the exam is performed with the patient in reverse Trendelenburg.  +---------+---------------+---------+-----------+----------+--------------+ RIGHT    CompressibilityPhasicitySpontaneityPropertiesThrombus Aging +---------+---------------+---------+-----------+----------+--------------+ CFV      Partial        Yes      Yes                  Acute          +---------+---------------+---------+-----------+----------+--------------+ SFJ      Partial                                      Acute          +---------+---------------+---------+-----------+----------+--------------+ FV Prox  None           No       No                   Acute          +---------+---------------+---------+-----------+----------+--------------+ FV Mid   Full           Yes      Yes                                  +---------+---------------+---------+-----------+----------+--------------+ FV DistalFull           Yes      Yes                                 +---------+---------------+---------+-----------+----------+--------------+ PFV      None           No       No                   Acute          +---------+---------------+---------+-----------+----------+--------------+ POP      None           No  No                   Acute          +---------+---------------+---------+-----------+----------+--------------+ PTV                                                   Not visualized +---------+---------------+---------+-----------+----------+--------------+ PERO                                                  Not visualized +---------+---------------+---------+-----------+----------+--------------+   Right Technical Findings: Not visualized segments include peroneal and posterior tibial veins.  +---------+---------------+---------+-----------+----------+--------------+ LEFT     CompressibilityPhasicitySpontaneityPropertiesThrombus Aging +---------+---------------+---------+-----------+----------+--------------+ CFV      Full           Yes      Yes                                 +---------+---------------+---------+-----------+----------+--------------+ SFJ      Full                                                        +---------+---------------+---------+-----------+----------+--------------+ FV Prox  Full           Yes      Yes                                 +---------+---------------+---------+-----------+----------+--------------+ FV Mid   Full           Yes      Yes                                 +---------+---------------+---------+-----------+----------+--------------+ FV DistalFull           Yes      Yes                                 +---------+---------------+---------+-----------+----------+--------------+ PFV      Full                                                         +---------+---------------+---------+-----------+----------+--------------+ POP      Full           Yes      Yes                                 +---------+---------------+---------+-----------+----------+--------------+ PTV      Full                                                        +---------+---------------+---------+-----------+----------+--------------+  PERO                                                  Not visualized +---------+---------------+---------+-----------+----------+--------------+ CFV, SFJ, PFV, and FV prox not well visualized due to HD placement. PopV difficult to image due to patient position.  Left Technical Findings: Not visualized segments include peroneal veins.   Summary: BILATERAL: - No evidence of superficial venous thrombosis in the lower extremities, bilaterally. -No evidence of popliteal cyst, bilaterally. RIGHT: - Findings consistent with acute deep vein thrombosis involving the right common femoral vein, SF junction, right femoral vein (prox), right proximal profunda vein, and right popliteal vein. - Portions of this examination were limited- see technologist comments above.  LEFT: - Portions of this examination were limited- see technologist comments above. - There is no evidence of deep vein thrombosis in the lower extremity.  *See table(s) above for measurements and observations. Electronically signed by Jamelle Haring on 12/12/2020 at 5:32:15 PM.    Final    CT CHEST ABDOMEN PELVIS WO CONTRAST  Result Date: 12/01/2020 CLINICAL DATA:  Abdominal pain and fever EXAM: CT CHEST, ABDOMEN AND PELVIS WITHOUT CONTRAST TECHNIQUE: Multidetector CT imaging of the chest, abdomen and pelvis was performed following the standard protocol without IV contrast. COMPARISON:  09/04/2020 FINDINGS: CT CHEST FINDINGS Cardiovascular: Calcific atherosclerosis of the aorta and coronary arteries. No pericardial effusion. Course and  caliber of the aorta are normal. Right chest wall Port-A-Cath tip is at the cavoatrial junction. Mediastinum/Nodes: No enlarged mediastinal, hilar, or axillary lymph nodes. Thyroid gland, trachea, and esophagus demonstrate no significant findings. Lungs/Pleura: Lungs are clear. No pleural effusion or pneumothorax. Musculoskeletal: No chest wall mass or suspicious bone lesions identified. CT ABDOMEN PELVIS FINDINGS Hepatobiliary: No focal liver abnormality is seen. No gallstones, gallbladder wall thickening, or biliary dilatation. Pancreas: Unremarkable. No pancreatic ductal dilatation or surrounding inflammatory changes. Spleen: Normal in size without focal abnormality. Adrenals/Urinary Tract: Adrenal glands are unremarkable. Kidneys are normal, without renal calculi, focal lesion, or hydronephrosis. Bladder is unremarkable. 3.5 cm left renal cyst. Stomach/Bowel: Normal appearance of the stomach and duodenum. No small bowel dilatation. 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 Status post partial colectomy. Vascular/Lymphatic: Aortic atherosclerosis. No enlarged abdominal or pelvic lymph nodes. Reproductive: Prostate is unremarkable. Other: Bilateral fat containing inguinal hernias. Musculoskeletal: No acute or significant osseous findings. IMPRESSION: 1. No acute abnormality of the chest, abdomen or pelvis. 2. Status post partial colectomy.  No evidence of obstruction. Aortic Atherosclerosis (ICD10-I70.0). Electronically Signed   By: Ulyses Jarred M.D.   On: 12/01/2020 02:20    ASSESSMENT AND PLAN: Transverse colon cancer, stage IIIb (T3N1a), status post a partial transverse colectomy and end colostomy 09/10/2020 CT abdomen/pelvis 09/04/2020- possible transverse colon mass with small regional lymph nodes Colonoscopy 09/05/2020- obstructing transverse colon mass-biopsy invasive adenocarcinoma, mass could not be passed CT chest 09/09/2020-no evidence of metastatic disease, 3 x 2 mm subpleural nodule in the right  upper lobe likely benign subpleural lymph node Partial transverse colectomy 09/10/2020, transverse colon tumor, no lymphovascular perineural invasion, 1/19 lymph nodes positive, negative margins, MSI stable, no loss of mismatch repair protein expression Cycle 1 CAPOX 10/08/2020, oxaliplatin dose reduced to 100 mg per metered squared secondary to renal insufficiency Cycle 2 CAPOX 10/29/2020 Cycle 3 CAPOX 11/19/2020, capecitabine dose reduced secondary to hand/foot syndrome, oxaliplatin dose reduced secondary to renal failure  and poor performance status CT chest/abdomen/pelvis without contrast 12/01/2020-no acute abnormality in the chest, abdomen, or pelvis.  No evidence of obstruction. Resection of a frontal meningioma 09/01/2016 Diabetes Rectal polyp- tubular adenoma on colonoscopy 09/05/2020 Hypertension Diabetic neuropathy Epilepsy Family history of breast and prostate cancer Admission with acute renal failure 10/01/2020-secondary to lack of oral intake and lisinopril, improved with intravenous hydration and holding lisinopril Admission 12/01/2020-sepsis, diarrhea HHS, A. fib with RVR, abdominal pain 11.  Respiratory failure requiring intubation 12/06/2020 12.  Progressive renal failure-CRRT started 12/06/2020 13.  Thrombocytopenia secondary to chemotherapy and sepsis, heparin-induced platelet antibody negative on 522 14.  Right lower extremity DVT diagnosed 12/12/2020  Thomas [name] is undergoing tracheostomy today.  He will continue management of his respiratory failure per PCCM.  Renal function worse today.  Nephrology following and planning for HD tomorrow.  His platelet count continues to improve.  HIT panel and SRA were negative.  Thrombocytopenia was likely due to to sepsis.  He is now back on heparin for treatment of DVT.  Recommendations: 1.  Continue management of sepsis syndrome and respiratory failure per critical care medicine 2.  Moisturizers, skin care to the hands and scrotum 3.   Management of renal failure/fluids per nephrology 4.  Medical oncology will sign off at this time.  Please call for questions.   LOS: 17 days   Mikey Bussing 12/18/20 Thomas [name] was examined.  The chart was reviewed.  The Xeloda induced skin changes at the hands and feet appear to be improving.  The platelet count has improved.  The serotonin release assay was negative and he is now back on heparin.  We will sign off for now.  I will check on him periodically.  Please call as needed.

## 2020-12-18 NOTE — Progress Notes (Signed)
eLink Physician-Brief Progress Note Patient Name: Thomas [name] DOB: 1956-01-18 MRN: [id number]   Date of Service  12/18/2020  HPI/Events of Note  Patient trached today and scheduled for [facility] tomorrow. Patient not receiving any tube feeding at present time. Blood glucose = 162 at 7:30 PM.   eICU Interventions  Plan: Decrease Levemir dose from 15 units to 8 units Q 12 hours. Continue to trend Q 4 hour blood glucose. May need D10W added if he becomes hypoglycemic.      Intervention Category Major Interventions: Hyperglycemia - active titration of insulin therapy  Lysle Dingwall 12/18/2020, 8:34 PM

## 2020-12-18 NOTE — Procedures (Signed)
Percutaneous Tracheostomy Procedure Note   MARQUETTE BLODGETT  [id number]  [birth year]  Date:12/18/20  Time:2:18 PM   Provider Performing:Sloan Galentine  Procedure: Percutaneous Tracheostomy with Bronchoscopic Guidance (16606)  Indication(s) Acute hypoxic respiratory failure  Consent Risks of the procedure as well as the alternatives and risks of each were explained to the patient and/or caregiver.  Consent for the procedure was obtained.  Anesthesia Etomidate, Versed, Fentanyl, Vecuronium   Time Out Verified patient identification, verified procedure, site/side was marked, verified correct patient position, special equipment/implants available, medications/allergies/relevant history reviewed, required imaging and test results available.   Sterile Technique Maximal sterile technique including sterile barrier drape, hand hygiene, sterile gown, sterile gloves, mask, hair covering.    Procedure Description Appropriate anatomy identified by palpation.  Patient's neck prepped and draped in sterile fashion.  1% lidocaine with epinephrine was used to anesthetize skin overlying neck.  1.5cm incision made and blunt dissection performed until tracheal rings could be easily palpated.   Then a size 6 Shiley tracheostomy was placed under bronchoscopic visualization using usual Seldinger technique and serial dilation.   Bronchoscope confirmed placement above the carina.  Tracheostomy was sutured in place with adhesive pad to protect skin under pressure.    Patient connected to ventilator.   Complications/Tolerance None; patient tolerated the procedure well. Chest X-ray is ordered to confirm no post-procedural complication.   EBL Minimal   Specimen(s) None   Mitzi Hansen, MD Internal Medicine Resident PGY-3 [facility] Pager: [phone] 12/18/2020 2:19 PM

## 2020-12-18 NOTE — Progress Notes (Signed)
eLink Physician-Brief Progress Note Patient Name: Thomas [name] DOB: Dec 14, 1955 MRN: [id number]   Date of Service  12/18/2020  HPI/Events of Note  Agitation - Nursing request to renew bilateral soft wrist restraints.   eICU Interventions  Will order bilateral soft wrist restraints X 7 hours.     Intervention Category Major Interventions: Delirium, psychosis, severe agitation - evaluation and management  [name] 12/18/2020, 2:31 AM

## 2020-12-18 NOTE — Procedures (Signed)
Extubation Procedure Note  Patient Details:   Name: Thomas [name] DOB: [birth year] MRN: [id number]   Airway Documentation:    Vent end date: 12/18/20 Vent end time: 0840   Evaluation  O2 sats: stable throughout Complications: No apparent complications Patient did tolerate procedure well. Bilateral Breath Sounds: Clear   Yes  Pt extubated to 4L [place] with RN x2 and MD at bedside. Positive cuff leak noted. Pt is tolerating well. RT will continue to monitor.  Cathie Olden 12/18/2020, 8:53 AM

## 2020-12-18 NOTE — Progress Notes (Addendum)
Palliative:  HPI: 65 yo male with PMH significant for stage 3b colon cancer, L MCA stroke, frontal brain tumor, seizures, HTN, HLD, sleep apnea, aspiration pneumonia, diabetes admitted 11/30/2020 with abd pain, anorexia, and weakness. Hospitalization complicated by septic shock of uncertain origin (potentially likely colitis vs pneumonia) leading to need for intubation, vasopressors, CRRT. His condition remains tenuous in ICU and has previously failed extubation. Extubated today 12/18/20 but not doing well.   I met today with Thomas [name] (wife) and Thomas [name] (they refer to her as a daughter but does not seem biological). Thomas [name] has been extubated and is alert and interactive but he is also very much struggling with secretions and clearing his airway. He complains of abd pain.   I had a longer discussion with Thomas [name] and Thomas [name] as they tell me that Thomas [name] has expressed to them that he wants to live but does not want a tracheostomy. I had an honest conversation with them that these two goals are not aligned. Seeing his status today I fully expect that he cannot protect his airway and will need tracheostomy if they wish to continue full scope (as previously requested). After discussed they feel that he would desire tracheostomy as he has clearly expressed to [name] previously that he does want to be resuscitated and full scope care unless he is in a persistent vegetative state. This is their only limitation at this time. I did explain that there is high probability that he will require tracheostomy and dialysis and his health condition places him at high risk for further complications as someone with unnatural tubes/drains and bedbound status are at risk for infection, skin breakdown/wounds, and this is going to be a very difficult path for Thomas [name]. They express understanding.   Thomas [name] is very frustrated at the constant questioning of Thomas [name]'s code status and everyone telling her about his poor prognosis and trying to convince  her to "let him go." I reassured her that this is not my goal and she confirms that this has not been in our conversations. I assured her that I will discuss these clear goals with the medical team to avoid these constant conversations in the near future (acknowledging the fact that as new providers cycle into Thomas [name]'s care they aren't always as aware of the previous conversations).   All questions/concerns addressed. Emotional support provided. Discussed with bedside RN and Dr. Tamala Julian.   Exam: Alert, appears oriented. Able to nod head yes/no and give thumbs up. Complains of abd pain. Unable to verbalize due to copious secretions. Very debilitated and weak.   Plan: - Full code, full scope.  - [place] with proceeding with tracheostomy if needed.  - CLEAR GOALS: ONLY IN PERSISTENT VEGETATIVE STATE TO "LET HIM GO." Otherwise desire for continued full scope.  - Wife frustrated with constant discussion and feeling she is trying to be convinced to go against her husband's wishes. Please be aware of this situation and goals prior to initiation of further conversations.   [place], NP Palliative Medicine Team Pager [phone] (Please see [email or web address] for schedule) Team Phone [phone]    Greater than 50%  of this time was spent counseling and coordinating care related to the above assessment and plan

## 2020-12-18 NOTE — Procedures (Signed)
Diagnostic Bronchoscopy  Thomas [name]  [id number]  1956/04/09  Date:12/18/20  Time:2:06 PM   Provider Performing:Cambrea Kirt D. Harris   Procedure: Diagnostic Bronchoscopy (21798)  Indication(s) Assist with direct visualization of tracheostomy placement  Consent Risks of the procedure as well as the alternatives and risks of each were explained to the patient and/or caregiver.  Consent for the procedure was obtained.   Anesthesia See separate tracheostomy note   Time Out Verified patient identification, verified procedure, site/side was marked, verified correct patient position, special equipment/implants available, medications/allergies/relevant history reviewed, required imaging and test results available.   Sterile Technique Usual hand hygiene, masks, gowns, and gloves were used   Procedure Description Bronchoscope advanced through endotracheal tube and into airway.  After suctioning out tracheal secretions, bronchoscope used to provide direct visualization of tracheostomy placement.   Complications/Tolerance None; patient tolerated the procedure well.   EBL None  Specimen(s) None  Thomas [name] D. Kenton Kingfisher, NP-C [facility] Personal contact information can be found on Amion  12/18/2020, 2:06 PM

## 2020-12-18 NOTE — Progress Notes (Signed)
Not doing too well with secretion management. Wife agreeable to trach. Will do at [place] MD PCCM

## 2020-12-18 NOTE — Progress Notes (Signed)
ANTICOAGULATION CONSULT NOTE - Follow Up Consult  Pharmacy Consult for Heparin Indication: DVT  No Known Allergies  Patient Measurements: Height: 5\' 8"  (172.7 cm) Weight: 106 kg (233 lb 11 oz) IBW/kg (Calculated) : 68.4 Heparin Dosing Weight: 91 kg  Vital Signs: Temp: 98.4 F (36.9 C) (08/11 0342) Temp Source: Oral (08/11 0342) BP: 122/73 (08/11 0400) Pulse Rate: 71 (08/11 0200)  Labs: Recent Labs    12/15/20 1437 12/15/20 1528 12/16/20 0417 12/16/20 1709 12/16/20 2122 12/17/20 0355 12/17/20 1103 12/17/20 1844 12/18/20 0433  HGB 16.3  --  8.8*  --   --  8.3*  --   --   --   HCT 48.0  --  27.5*  --   --  26.2*  --   --   --   PLT  --   --  113*  --   --  125*  --   --   --   APTT  --  63* 64* 151*  --   --   --   --   --   HEPARINUNFRC  --   --   --   --    < > 1.03* 0.82* 1.00* 0.49  CREATININE  --  1.61* 1.57*  --   --   --   --   --   --    < > = values in this interval not displayed.     Estimated Creatinine Clearance: 56.1 mL/min (A) (by C-G formula based on SCr of 1.57 mg/dL (H)).   Medications:  Infusions:   sodium chloride 10 mL/hr at 12/18/20 0200   dexmedetomidine (PRECEDEX) IV infusion 1 mcg/kg/hr (12/18/20 0432)   feeding supplement (VITAL AF 1.2 CAL) 1,000 mL (12/17/20 2029)   heparin 850 Units/hr (12/18/20 0200)   levETIRAcetam Stopped (12/17/20 2204)   promethazine (PHENERGAN) injection (IM or IVPB)     thiamine injection Stopped (12/17/20 2148)    Assessment: 65 yom admitted on 7/24 with high ostomy output, sepsis vs chemotherapy reaction; developed respiratory failure, aspiration, AKI, ischemic colitis.  He was initially on prophylactic heparin, then apixaban for Afib, then pharmacy was consulted to dose heparin IV for afib, then stopped for bleeding on 8/2 (also back in NSR), but remained on heparin in CRRT circuit.  Pharmacy was consulted for Argatroban for DVT on 8/5 due to concern for HITT.  Pharmacy consulted on 8/9 to change back to  heparin with negative HIT Ab and SRA.  - 8/5 HIT antb: 0.144, negative (0 - 0.400) - 8/5 SRA: negative  Heparin level remains elevated at 1.0, will hold and reduce rate.  8/11 AM update:  Heparin level therapeutic after rate decrease  Goal of Therapy:  Heparin level 0.3-0.7 units/mL Monitor platelets by anticoagulation protocol: Yes   Plan:  -Cont heparin at 850 units/hr -Confirmatory heparin level at [place], PharmD, [facility] Pharmacist Phone: [phone]

## 2020-12-18 NOTE — Progress Notes (Signed)
[facility] Progress Note  Subjective: + 2 L yest, wt's up 106kg  Vitals:   12/18/20 0804 12/18/20 0840 12/18/20 1114 12/18/20 1207  BP:  104/65  104/81  Pulse:  77  83  Resp:  19  (!) 26  Temp: 98.2 F (36.8 C)  98.4 F (36.9 C)   TempSrc: Axillary  Axillary   SpO2:  95%  96%  Weight:      Height:        Exam: ERX:[id number] and sedated CVS: RRR Resp: occ rhonchi Abd: +BS, soft, NT/ND Ext: no edema bilat LE's       Assessment/ Plan: AKI/CKD stage IIIb - ischemic ATN in setting of severe sepsis and MSOF. Creat 2.1 on admission and rose to peak 5.20 when CRRT initiated 7/31.  L fem cath placed 12/07/20. CRRT on hold for now. Tx'd to [facility] yesterday. Don't see any signs of renal recovery. Get I/O cath. Plan is for regular HD tomorrow.  AMS - hx seizure d/o, hx CVA 2015. Per CCM.  Volume - anasarca resolved, 1+ hip/ pretibial edema today Sepsis/ VDRF/ aspiration pneumonia - on admit. Off pressors and SP course of  IV maxipime/vanc.  Extubated now. May need trach.  Stage IIIB colon cancer - sp partial colectomy, colostomy. On chemoRx sp 3 cycles Colitis - completed abx 8/7. Remains on tube feeds.  Right leg DVT - now on argatroban. Thrombocytopenia - improving Atrial fibrillation -on amiodarone and rate controlled. Anemia of critical illness- transfuse prn.      Thomas [name] 12/18/2020, 12:20 PM   Recent Labs  Lab 12/16/20 0417 12/17/20 0355 12/18/20 0433  K 4.8  --  4.7  BUN 37*  --  89*  CREATININE 1.57*  --  3.85*  CALCIUM 7.3*  --  7.6*  PHOS 3.3  --  7.8*  HGB 8.8* 8.3* 8.0*  8.3*    Inpatient medications:  albuterol  2.5 mg Nebulization QID   atorvastatin  20 mg Oral Daily   chlorhexidine gluconate (MEDLINE KIT)  15 mL Mouth Rinse BID   Chlorhexidine Gluconate Cloth  6 each Topical Daily   clonazepam  1 mg Per Tube TID   cloNIDine  0.1 mg Per Tube TID   docusate  100 mg Per Tube BID   etomidate  40 mg Intravenous Once   famotidine   20 mg Per Tube Daily   feeding supplement (PROSource TF)  90 mL Per Tube QID   fentaNYL (SUBLIMAZE) injection  200 mcg Intravenous Once   hydrocerin   Topical BID   insulin aspart  0-15 Units Subcutaneous Q4H   insulin detemir  15 Units Subcutaneous BID   mouth rinse  15 mL Mouth Rinse 10 times per day   midazolam  5 mg Intravenous Once   OLANZapine  5 mg Per Tube QHS   polyethylene glycol  17 g Per Tube Daily   vecuronium  10 mg Intravenous Once    sodium chloride 10 mL/hr at 12/18/20 0600   dexmedetomidine (PRECEDEX) IV infusion 1 mcg/kg/hr (12/18/20 0600)   feeding supplement (VITAL AF 1.2 CAL) 1,000 mL (12/17/20 2029)   heparin 850 Units/hr (12/18/20 0600)   levETIRAcetam 1,000 mg (12/18/20 0926)   promethazine (PHENERGAN) injection (IM or IVPB)     thiamine injection 250 mg (12/18/20 1007)   sodium chloride, acetaminophen, dextrose, docusate, fentaNYL (SUBLIMAZE) injection, methocarbamol, promethazine (PHENERGAN) injection (IM or IVPB), sodium chloride flush

## 2020-12-18 NOTE — Procedures (Signed)
Intubation Procedure Note  Thomas [name]  [id number]  04/09/1956  Date:12/18/20  Time:2:16 PM   Provider Performing:Essance Gatti    Procedure: Intubation (31500)  Indication(s) Respiratory Failure  Consent Risks of the procedure as well as the alternatives and risks of each were explained to the patient and/or caregiver.  Consent for the procedure was obtained and is signed in the bedside chart   Anesthesia Etomidate, Versed, Fentanyl, and Rocuronium   Time Out Verified patient identification, verified procedure, site/side was marked, verified correct patient position, special equipment/implants available, medications/allergies/relevant history reviewed, required imaging and test results available.   Sterile Technique Usual hand hygeine, masks, and gloves were used   Procedure Description Patient positioned in bed supine.  Sedation given as noted above.  Patient was intubated with endotracheal tube using Glidescope.  View was Grade 1 full glottis .  Number of attempts was 1.  Colorimetric CO2 detector was consistent with tracheal placement.   Complications/Tolerance None; patient tolerated the procedure well. Chest X-ray is ordered to verify placement.   EBL Minimal   Specimen(s) None   Mitzi Hansen, MD Internal Medicine Resident PGY-3 [facility] Pager: [phone] 12/18/2020 2:17 PM

## 2020-12-18 NOTE — Progress Notes (Signed)
NAME:  Thomas [name], MRN:  [id number], DOB:  August 07, 1955, LOS: 79 ADMISSION DATE:  11/30/2020, CONSULTATION DATE:  7/30 REFERRING MD:  Riccardo Dubin, CHIEF COMPLAINT:  Worsening hypoxia/dyspnea  History of Present Illness:  65 yo M admitted for worsening sepsis vs reaction to chemotherapy with high ostomy output and soft BP on 7/25. Has had issues during his hospitalization with recurrent aspirations and more recently worsening respiratory status.  Most recently pt developed worsening hypoxia and was requiring BIPAP w/ FIO2 of 100% and increased work of breathing. Unable to obtain ABG, CXR in AM of 7/30 showed some worsening concern of aspiration.   Who was admitted to intensive care and subsequently had worsening clinical status requiring intubation, CRRT, multiple pressors and pneumonia along with CT findings 8/1 concerning for ischemic bowel.  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  Hx of remote MCA stroke Asthma hx as a child Epilepsy on keppra GERD T2DM on metformin as outpt HTN HLD OSA  Significant Hospital Events:  7/27 Transferred to [facility], stopped abx as sepsis resolved/ruled out 7/30 AM Worsened AME, more hypoxic with worsening renal funciton, new aspiration on abx restarted 7/30 PM Pt with increased o2 requirement on 100% FIO2 on BIPAP, d/w family who wanted to proceed with intubation, pt intubated 8/1 Some issues with tachycardia and hypotension overnight. Reproted high ostomy output  8/2 down to 9 mcg of Levophed, however repeat CT abdomen/pelvis showed inflammatory wall thickening and fat stranding of the descending and sigmoid colon, new since prior, consistent with nonspecific infectious, inflammatory, or ischemic colitis.  Also new dense consolidative airspace opacity in the right lower and right middle lobes 8/3  further weaning NE. Weaning sedation. Full code, full scope per family meeting 8/2  8/4 off pressors, plt down to 103.  8/5 remains off pressors. Moving around more, not really following commands though  8/9-failed extubation 8/8, tolerated nasal cannula for about an hour, desaturations and inability to clear secretions-reintubated 8/8 8/10 some agitation overnight  Micro: Resp viral panel 7/24 > neg covid/ flu BC  7/24  x 2 neg  MRSA  7/25 neg  UC   7/25  neg  C diff screeen 7/26  neg ET culture 7/31 > moderate yeast> grew mod candida tropicalis GI panel  8/1> neg   Abx: Unasyn 7/30 >>7/31 Maxepime 7/31 >> 8/7 Flagyl 7/31 >>  8/7 Vancomycin 8/1 only  Anidulafungin 8/1>>8/3    Scheduled Meds:  albuterol  2.5 mg Nebulization QID   atorvastatin  20 mg Oral Daily   chlorhexidine gluconate (MEDLINE KIT)  15 mL Mouth Rinse BID   Chlorhexidine Gluconate Cloth  6 each Topical Daily   clonazepam  1 mg Per Tube TID   cloNIDine  0.1 mg Per Tube TID   docusate  100 mg Per Tube BID   famotidine  20 mg Per Tube Daily   feeding supplement (PROSource TF)  90 mL Per Tube QID   hydrocerin   Topical BID   insulin aspart  0-15 Units Subcutaneous Q4H   insulin detemir  10 Units Subcutaneous BID   mouth rinse  15 mL Mouth Rinse 10 times per day   OLANZapine  5 mg Per Tube QHS   polyethylene glycol  17 g Per Tube Daily   Continuous Infusions:  sodium chloride 10 mL/hr at 12/18/20 0600   dexmedetomidine (PRECEDEX) IV  infusion 1 mcg/kg/hr (12/18/20 0600)   feeding supplement (VITAL AF 1.2 CAL) 1,000 mL (12/17/20 2029)   heparin 850 Units/hr (12/18/20 0600)   levETIRAcetam Stopped (12/17/20 2204)   promethazine (PHENERGAN) injection (IM or IVPB)     thiamine injection Stopped (12/17/20 2148)   PRN Meds:.sodium chloride, acetaminophen, dextrose, docusate, fentaNYL (SUBLIMAZE) injection, methocarbamol, promethazine (PHENERGAN) injection (IM or IVPB), sodium chloride flush    Interim History  / Subjective:  Sedated. Turned off precedex this am and switched vent to SBT. Hopeful to attempt extubation today. Will reassess once sedation clears for SAT. Will monitor during SBT.  Objective   Blood pressure 112/72, pulse 69, temperature 98.2 F (36.8 C), temperature source Axillary, resp. rate (!) 27, height 5' 8"  (1.727 m), weight 106 kg, SpO2 100 %.    Vent Mode: PSV;CPAP FiO2 (%):  [30 %] 30 % Set Rate:  [14 bmp] 14 bmp Vt Set:  [550 mL] 550 mL PEEP:  [5 cmH20] 5 cmH20 Pressure Support:  [10 cmH20] 10 cmH20 Plateau Pressure:  [17 cmH20-26 cmH20] 26 cmH20   Intake/Output Summary (Last 24 hours) at 12/18/2020 0824 Last data filed at 12/18/2020 0600 Gross per 24 hour  Intake 2092.38 ml  Output 1150 ml  Net 942.38 ml   Filed Weights   12/15/20 0521 12/16/20 0500 12/17/20 0349  Weight: 106.6 kg 103.4 kg 106 kg   Afebrile General: appearance: Sedated, ventilated lying in bed underneath bair hugger. HEENT: Endotracheal tube in place. MMM. CV: s1 normal, s2 normal. Regular rate, normal rhythm. No murmur, rub, gallop. Respiratory: Decreased air movement.  Abdominal: Bowel sounds present. Non distended. Neuro: Sedated, difficult to assess at this time. Extremities: Warm. No peripheral edema.  Resolved Hospital Problem list   Shock  HHS Afib RVR  Lactic Acidosis    Assessment & Plan:   Altered mental status Metabolic encephalopathy History of CVA in 2015 History of seizure disorder -Continue with Keppra -Continue to wean sedation as tolerated. PAD protocol. Currently off sedation this am for SAT.  Acute hypoxemic respiratory failure Aspiration pneumonia Currently on SBT, holding sedation for SAT this am. Patient had failed extubation 8/8 secondary to increased secretions -> low O2 sats. Has been weaning well, hopeful for extubation later today (8/11). Completed course of antibiotics on 8/7 - last chest xray (8/8) without focal lung disease. - Continue pulmonary  hygiene - Will attempt extubation today if SAT/SBT adequate. Continue to follow closely. If fails extubation today, will likely require tracheostomy.  Colitis Completed abx 8/7. -Remains on tube feeds -Monitor ostomy output  AKI on chronic kidney disease stage IIIb Management per nephrology. CRRT currently on hold (since 8/9) for now.   Stage IIIb transverse colon cancer S/p partial transverse colectomy, end colectomy On CAPOX -Has had 3 cycles 6/1, 6/22, 7/13 -He follows with Dr. Ammie Dalton  Heart failure with preserved ejection fraction -Last ejection fraction of 60 to 65% on last echo  Thrombocytopenia -Continue to monitor, overall improving since admission.  Hypertension Has been normotensive. No further management at this time. - Continue to hold home amlodipine.  Hyperlipidemia Has not been on home lipitor 20 mg. Will start back today.  Type 2 diabetes -Continue SSI  Capecitabine induced desquamation Hand/foot syndrome -Continue moisturizer to hands and feet and scrotum  Goals of care discussion 8/2 -Continue full scope of care   Best Practice  Diet/type: tf per nutrition DVT prophylaxis: SCD/ heparin GI prophylaxis: H2B Lines: Port, femoral trialysis,  Foley:  N/A Code Status:  full  code Last date of goals of care discussion : 8/2 : PCCM Palli, RN, family -- full code full scope     Fredderick Severance, [facility] 12/18/20 8:24 AM

## 2020-12-18 NOTE — Progress Notes (Signed)
Performed in and out catheter without difficulty.  Was able to drain 566ml of dark amber urine into foley bag.

## 2020-12-19 ENCOUNTER — Inpatient Hospital Stay (HOSPITAL_COMMUNITY): Payer: Medicare HMO

## 2020-12-19 DIAGNOSIS — E1100 Type 2 diabetes mellitus with hyperosmolarity without nonketotic hyperglycemic-hyperosmolar coma (NKHHC): Secondary | ICD-10-CM | POA: Diagnosis not present

## 2020-12-19 DIAGNOSIS — N170 Acute kidney failure with tubular necrosis: Secondary | ICD-10-CM | POA: Diagnosis not present

## 2020-12-19 DIAGNOSIS — E440 Moderate protein-calorie malnutrition: Secondary | ICD-10-CM | POA: Diagnosis not present

## 2020-12-19 DIAGNOSIS — G9341 Metabolic encephalopathy: Secondary | ICD-10-CM | POA: Diagnosis not present

## 2020-12-19 LAB — HEPARIN LEVEL (UNFRACTIONATED)
Heparin Unfractionated: 0.23 IU/mL — ABNORMAL LOW (ref 0.30–0.70)
Heparin Unfractionated: 0.45 IU/mL (ref 0.30–0.70)
Heparin Unfractionated: 0.5 IU/mL (ref 0.30–0.70)

## 2020-12-19 LAB — CBC
HCT: 23.4 % — ABNORMAL LOW (ref 39.0–52.0)
Hemoglobin: 7.7 g/dL — ABNORMAL LOW (ref 13.0–17.0)
MCH: 29.3 pg (ref 26.0–34.0)
MCHC: 32.9 g/dL (ref 30.0–36.0)
MCV: 89 fL (ref 80.0–100.0)
Platelets: 149 10*3/uL — ABNORMAL LOW (ref 150–400)
RBC: 2.63 MIL/uL — ABNORMAL LOW (ref 4.22–5.81)
RDW: 22.6 % — ABNORMAL HIGH (ref 11.5–15.5)
WBC: 15.1 10*3/uL — ABNORMAL HIGH (ref 4.0–10.5)
nRBC: 0 % (ref 0.0–0.2)

## 2020-12-19 LAB — GLUCOSE, CAPILLARY
Glucose-Capillary: 96 mg/dL (ref 70–99)
Glucose-Capillary: 91 mg/dL (ref 70–99)
Glucose-Capillary: 104 mg/dL — ABNORMAL HIGH (ref 70–99)
Glucose-Capillary: 120 mg/dL — ABNORMAL HIGH (ref 70–99)
Glucose-Capillary: 220 mg/dL — ABNORMAL HIGH (ref 70–99)
Glucose-Capillary: 253 mg/dL — ABNORMAL HIGH (ref 70–99)

## 2020-12-19 LAB — RENAL FUNCTION PANEL
Albumin: 1.6 g/dL — ABNORMAL LOW (ref 3.5–5.0)
Anion gap: 14 (ref 5–15)
BUN: 106 mg/dL — ABNORMAL HIGH (ref 8–23)
CO2: 16 mmol/L — ABNORMAL LOW (ref 22–32)
Calcium: 7.5 mg/dL — ABNORMAL LOW (ref 8.9–10.3)
Chloride: 109 mmol/L (ref 98–111)
Creatinine, Ser: 4.78 mg/dL — ABNORMAL HIGH (ref 0.61–1.24)
GFR, Estimated: 13 mL/min — ABNORMAL LOW (ref >60)
Glucose, Bld: 104 mg/dL — ABNORMAL HIGH (ref 70–99)
Phosphorus: 8.6 mg/dL — ABNORMAL HIGH (ref 2.5–4.6)
Potassium: 4.4 mmol/L (ref 3.5–5.1)
Sodium: 139 mmol/L (ref 135–145)

## 2020-12-19 LAB — HEPATITIS B SURFACE ANTIGEN: Hepatitis B Surface Ag: NONREACTIVE

## 2020-12-19 LAB — HEPATITIS B CORE ANTIBODY, TOTAL: Hep B Core Total Ab: NONREACTIVE

## 2020-12-19 MED ORDER — PROSOURCE TF PO LIQD
45.0000 mL | Freq: Two times a day (BID) | ORAL | Status: DC
  Administered 2020-12-19 – 2020-12-24 (×11): 45 mL
  Filled 2020-12-19 (×12): qty 45

## 2020-12-19 MED ORDER — VITAL 1.5 CAL PO LIQD
1000.0000 mL | ORAL | Status: AC
  Administered 2020-12-19 – 2020-12-24 (×6): 1000 mL
  Filled 2020-12-19 (×7): qty 1000

## 2020-12-19 MED ORDER — LEVETIRACETAM IN NACL 500 MG/100ML IV SOLN
500.0000 mg | Freq: Two times a day (BID) | INTRAVENOUS | Status: DC
  Administered 2020-12-19 – 2020-12-24 (×11): 500 mg via INTRAVENOUS
  Filled 2020-12-19 (×12): qty 100

## 2020-12-19 MED ORDER — MIDODRINE HCL 5 MG PO TABS
5.0000 mg | ORAL_TABLET | Freq: Three times a day (TID) | ORAL | Status: DC
  Administered 2020-12-19 – 2020-12-20 (×2): 5 mg
  Filled 2020-12-19 (×2): qty 1

## 2020-12-19 MED ORDER — HEPARIN SODIUM (PORCINE) 1000 UNIT/ML IJ SOLN
INTRAMUSCULAR | Status: AC
  Administered 2020-12-19: 3200 [IU]
  Filled 2020-12-19: qty 6

## 2020-12-19 MED ORDER — NEPRO/CARBSTEADY PO LIQD
1000.0000 mL | ORAL | Status: DC
  Filled 2020-12-19: qty 1000

## 2020-12-19 MED ORDER — CHLORHEXIDINE GLUCONATE CLOTH 2 % EX PADS
6.0000 | MEDICATED_PAD | Freq: Every day | CUTANEOUS | Status: DC
  Administered 2020-12-20 – 2020-12-21 (×2): 6 via TOPICAL

## 2020-12-19 NOTE — Progress Notes (Signed)
RT NOTE: patient placed on CPAP/PSV of 10/5 at 0726.  Currently tolerating well.  Will continue to monitor.

## 2020-12-19 NOTE — Progress Notes (Signed)
[facility] Progress Note  Subjective:  He was transferred from [facility] to [place].  Appears off of CRRT after 8/9 (had 1.1 liters UF over 8/9 with CRRT).  He had 500 mL uop over 8/11.  Back on vent overnight for apnea per nursing.  Review of systems:  Unable to obtain 2/2 mechanical ventilation    Vitals:   12/19/20 0700 12/19/20 0729 12/19/20 0735 12/19/20 0740  BP: 111/75 114/72    Pulse: (!) 58 60    Resp: 20 (!) 23    Temp:   (!) 97.4 F (36.3 C)   TempSrc:   Axillary   SpO2: 100% 100%  99%  Weight:      Height:        Exam:  General adult male in bed critically ill  HEENT normocephalic atraumatic  Neck on vent via trach Lungs clear to auscultation anteriorly; fio2 40 and peep 5 Heart S1S2 no rub Abdomen soft nontender with limit of sedation nondistended Extremities 2+ edema  Neuro - continuous sedation running Access left femoral catheter nontunneled in place   Assessment/ Plan: AKI/CKD stage IIIb - ischemic ATN in setting of severe sepsis and multisystem organ failure.  Creat 2.1 on admission and rose to peak 5.20 when CRRT initiated 7/31.  Appears CRRT stopped after 8/9.  L fem cath placed 12/07/20.  Plan for first intermittent HD today.  Assess needs daily Note access and will need to transition to tunneled catheter next week if remains dependent on renal replacement therapy  AMS - hx seizure d/o, hx CVA 2015. Per CCM.  Volume overload -  anasarca resolved Sepsis/aspiration pneumonia - Off pressors and SP course of  IV maxipime/vanc.  Acute hypoxic respiratory failure - optimize volume with HD.  Vent per pulm.  Stage IIIB colon cancer - sp partial colectomy, colostomy. On chemoRx sp 3 cycles Colitis - completed abx 8/7.  Right leg DVT - anticoagulation per primary team  Thrombocytopenia - improving Atrial fibrillation - s/p amiodarone, dilt; per primary  Anemia of critical illness- transfuse prn. Defer ESA - note malignancy Hyperphosphatemia -  follow with HD. Change to nepro    Recent Labs  Lab 12/18/20 0433 12/19/20 0354  K 4.7 4.4  BUN 89* 106*  CREATININE 3.85* 4.78*  CALCIUM 7.6* 7.5*  PHOS 7.8* 8.6*  HGB 8.0*  8.3* 7.7*   Inpatient medications:  albuterol  2.5 mg Nebulization QID   atorvastatin  20 mg Per Tube Daily   chlorhexidine gluconate (MEDLINE KIT)  15 mL Mouth Rinse BID   Chlorhexidine Gluconate Cloth  6 each Topical Daily   clonazepam  1 mg Per Tube TID   cloNIDine  0.1 mg Per Tube TID   docusate  100 mg Per Tube BID   etomidate  40 mg Intravenous Once   famotidine  20 mg Per Tube Daily   feeding supplement (PROSource TF)  90 mL Per Tube QID   hydrocerin   Topical BID   insulin aspart  0-15 Units Subcutaneous Q4H   insulin detemir  8 Units Subcutaneous BID   mouth rinse  15 mL Mouth Rinse QID   OLANZapine zydis  5 mg Oral QHS   polyethylene glycol  17 g Per Tube Daily    sodium chloride 10 mL/hr at 12/19/20 0500   dexmedetomidine (PRECEDEX) IV infusion 0.8 mcg/kg/hr (12/19/20 0651)   feeding supplement (VITAL AF 1.2 CAL) Stopped (12/18/20 1300)   heparin 950 Units/hr (12/19/20 0522)   levETIRAcetam Stopped (12/18/20  2152)   norepinephrine (LEVOPHED) Adult infusion Stopped (12/18/20 1905)   promethazine (PHENERGAN) injection (IM or IVPB)     propofol (DIPRIVAN) infusion 50 mcg/kg/min (12/18/20 1604)   thiamine injection Stopped (12/19/20 0030)   sodium chloride, acetaminophen, dextrose, docusate, fentaNYL (SUBLIMAZE) injection, fentaNYL (SUBLIMAZE) injection, methocarbamol, promethazine (PHENERGAN) injection (IM or IVPB), sodium chloride flush   Claudia Desanctis, MD 8:17 AM 12/19/2020

## 2020-12-19 NOTE — Progress Notes (Signed)
PT Cancellation Note  Patient Details Name: Thomas [name] MRN: [id number] DOB: 1956-04-01   Cancelled Treatment:    Reason Eval/Treat Not Completed: Medical issues which prohibited therapy;Patient's level of consciousness Patient intubated and sedated. Unsuccessful extubation yesterday resulting in reintubation via trach. RN also reports bedside dialysis to begin soon this a.m. Will check back as able. Thomas [name], PT Acute Rehab Services Pager [phone] [facility] [phone]    Thomas [name] 12/19/2020, 10:23 AM

## 2020-12-19 NOTE — Progress Notes (Signed)
SLP Cancellation Note  Patient Details Name: Thomas [name] MRN: [id number] DOB: 1956-01-11   Cancelled evaluation:        Orders received for swallow and PMV evaluations. Pt underwent trach yesterday; still on full vent support with plans to begin weaning. SLP will f/u Monday, Aug 15 for potential readiness for assessments. D/W RN, Lattie Haw.  Terence Googe L. Tivis Ringer, [place] CCC/SLP Acute Rehabilitation Services Office number [phone] Pager [phone]  [name] 12/19/2020, 11:10 AM

## 2020-12-19 NOTE — Progress Notes (Signed)
OT Cancellation Note  Patient Details Name: Thomas [name] MRN: [id number] DOB: Dec 09, 1955   Cancelled Treatment:    Reason Eval/Treat Not Completed: Medical issues which prohibited therapy;Patient's level of consciousness. Patient intubated and sedated. Unsuccessful extubation yesterday resulting in reintubation via trach. RN also reports bedside dialysis to begin soon this a.m. OT to check back  8/14.  [name] OTR/L Supplemental OT, Department of rehab services [phone]  [name] R H. 12/19/2020, 10:12 AM

## 2020-12-19 NOTE — Progress Notes (Signed)
Nutrition Follow-up  DOCUMENTATION CODES:  Non-severe (moderate) malnutrition in context of chronic illness, Obesity unspecified  INTERVENTION:  Once Cortrak is placed and ready for use, begin: -Vital 1.5 @ 65 ml/h (1574ml/d) -Prosource TF 45 ml BID Provides 2420 kcal, 127 gm protein, 1192 ml free water daily  NUTRITION DIAGNOSIS:  Moderate Malnutrition related to chronic illness, cancer and cancer related treatments as evidenced by mild fat depletion, mild muscle depletion, percent weight loss. -- Ongoing  GOAL:  Patient will meet greater than or equal to 90% of their needs -- will address with TF  MONITOR:  Vent status, TF tolerance, Labs, Weight trends, Skin, I & O's  REASON FOR ASSESSMENT:  Consult Enteral/tube feeding initiation and management  ASSESSMENT:  65 y.o. male who is a former with medical history of stage 3 colon cancer s/p partial colectomy and end colostomy on 5/4, recent chemo start with 3rd cycle on 7/22, sleep apnea, seizures, epilepsy, brain tumor, HTN, HLD, GERD, asthma, peripheral neuropathy, urinary frequency, DM, MCA stroke. He presented to the ED via EMS due to abdominal pain x2 weeks, anorexia, and weakness. Admitted for close monitoring for concern of mesenteric ischemia. Patient reported that abdominal pain was worse with eating PTA.  7/31 CRRT initiated 8/9 CRRT stopped  8/10 tx to [facility] for trach placement and iHD 8/11 s/p trach  Pt back on full support vent settings this morning MV: 11.2 L/min Temp (24hrs), Avg:97.4 F (36.3 C), Min:96.5 F (35.8 C), Max:98.4 F (36.9 C)  Pt to have first iHD today.   Remains NPO with no enteral access. Discussed pt with MD. Order for Cortrak placed with updated tube feeding recommendations as described above.   Labs: Cr 4.78 (H), PO4 8.6 (H) CBGs: 91-162 x 24 hours  Medications reviewed and include colace, pepcid, novolog, levemir, miralax, precedex, keppra, levophed, thiamine  Admission weight 111.8  kg Current weight 109.5 kg Lowest weight since admission 103.4 kg (8/9)  UOP: 540ml x24 hours Colostomy output 250 ml x 24 hours I/O -965mL since admission  Diet Order:   Diet Order             Diet NPO time specified  Diet effective midnight                  EDUCATION NEEDS:  Not appropriate for education at this time  Skin:  Skin Assessment: Skin Integrity Issues: Skin Integrity Issues:: Stage II, DTI, Other (Comment) DTI: upper R + L face (newly documented 8/7) Stage II: mid- coccyx; R back (newly documented 8/7) Other: sloughing to L hand d/t hand-foot-and-mouth disease  Last BM:  8/12 via colostomy  Height:  Ht Readings from Last 1 Encounters:  12/01/20 5\' 8"  (1.727 m)   Weight:  Wt Readings from Last 1 Encounters:  12/18/20 109.5 kg   Ideal Body Weight:  70 kg  BMI:  Body mass index is 36.71 kg/m.  Estimated Nutritional Needs:  Kcal:  1610-9604 Protein:  120-140 gm Fluid:  1 L + UOP   Larkin Ina, MS, RD, LDN (she/her/hers) RD pager number and weekend/on-call pager number located in [place].

## 2020-12-19 NOTE — Procedures (Signed)
Cortrak  Tube Type:  Cortrak - 43 inches Tube Location:  Left nare Initial Placement:  Stomach Secured by: Bridle Technique Used to Measure Tube Placement:  Marking at nare/corner of mouth Cortrak Secured At:  72 cm  Cortrak Tube Team Note:  Consult received to place a Cortrak feeding tube.   X-ray is required, abdominal x-ray has been ordered by the Cortrak team. Please confirm tube placement before using the Cortrak tube.   If the tube becomes dislodged please keep the tube and contact the Cortrak team at [email or web address] (password [email or web address]) for replacement.  If after hours and replacement cannot be delayed, place a NG tube and confirm placement with an abdominal x-ray.    Koleen Distance MS, RD, LDN Please refer to [employer] [facility] for RD and/or RD on-call/weekend/after hours pager

## 2020-12-19 NOTE — Progress Notes (Signed)
RT NOTE: patient placed back on full support ventilator settings due to patient going apneic and backup ventilation alarming.  Tolerating ventilator settings well at this time.  Will continue to monitor.

## 2020-12-19 NOTE — Progress Notes (Signed)
ANTICOAGULATION CONSULT NOTE - Follow Up Consult  Pharmacy Consult for Heparin Indication: DVT  No Known Allergies  Patient Measurements: Height: 5\' 8"  (172.7 cm) Weight: 107.1 kg (236 lb 1.8 oz) IBW/kg (Calculated) : 68.4 Heparin Dosing Weight: 91 kg  Vital Signs: Temp: 97.1 F (36.2 C) (08/12 1923) Temp Source: Axillary (08/12 1923) BP: 110/68 (08/12 2000) Pulse Rate: 78 (08/12 2000)  Labs: Recent Labs    12/17/20 0355 12/17/20 1103 12/18/20 0433 12/19/20 0354 12/19/20 1224 12/19/20 2031  HGB 8.3*  --  8.0*  8.3* 7.7*  --   --   HCT 26.2*  --  25.8*  25.6* 23.4*  --   --   PLT 125*  --  154  144* 149*  --   --   HEPARINUNFRC 1.03*   < > 0.49 0.23* 0.45 0.50  CREATININE  --   --  3.85* 4.78*  --   --    < > = values in this interval not displayed.     Estimated Creatinine Clearance: 18.5 mL/min (A) (by C-G formula based on SCr of 4.78 mg/dL (H)).   Medications:  Infusions:   sodium chloride 10 mL/hr at 12/19/20 2000   dexmedetomidine (PRECEDEX) IV infusion 0.8 mcg/kg/hr (12/19/20 2104)   feeding supplement (VITAL 1.5 CAL) 1,000 mL (12/19/20 1612)   heparin 950 Units/hr (12/19/20 2028)   levETIRAcetam 500 mg (12/19/20 2101)   norepinephrine (LEVOPHED) Adult infusion Stopped (12/18/20 1905)   promethazine (PHENERGAN) injection (IM or IVPB)     propofol (DIPRIVAN) infusion 50 mcg/kg/min (12/18/20 1604)   thiamine injection 250 mg (12/19/20 2105)    Assessment: 65 yom admitted on 7/24 with high ostomy output, sepsis vs chemotherapy reaction; developed respiratory failure, aspiration, AKI, ischemic colitis.  He was initially on prophylactic heparin, then apixaban for Afib, then pharmacy was consulted to dose heparin IV for afib, then stopped for bleeding on 8/2 (also back in NSR), but remained on heparin in CRRT circuit.  Pharmacy was consulted for Argatroban for DVT on 8/5 due to concern for HITT.  Pharmacy consulted on 8/9 to change back to heparin with  negative HIT Ab and SRA.  - 8/5 HIT antb: 0.144, negative (0 - 0.400) - 8/5 SRA: negative   8/12 update:  Heparin level therapeutic x2 on 950 units/hr. No bleeding issues noted.  Goal of Therapy:  Heparin level 0.3-0.7 units/mL Monitor platelets by anticoagulation protocol: Yes   Plan:  -Continue heparin at 950 units/hr -Daily heparin level and CBC  [name] PharmD., BCPS Clinical Pharmacist 12/19/2020 9:14 PM

## 2020-12-19 NOTE — Progress Notes (Signed)
NAME:  Thomas [name], MRN:  [id number], DOB:  04/17/1956, LOS: 33 ADMISSION DATE:  11/30/2020, CONSULTATION DATE:  7/30 REFERRING MD:  Riccardo Dubin, CHIEF COMPLAINT:  Worsening hypoxia/dyspnea  History of Present Illness:  65 yo M admitted for worsening sepsis vs reaction to chemotherapy with high ostomy output and soft BP on 7/25. Has had issues during his hospitalization with recurrent aspirations and more recently worsening respiratory status.  Most recently pt developed worsening hypoxia and was requiring BIPAP w/ FIO2 of 100% and increased work of breathing. Unable to obtain ABG, CXR in AM of 7/30 showed some worsening concern of aspiration.   Who was admitted to intensive care and subsequently had worsening clinical status requiring intubation, CRRT, multiple pressors and pneumonia along with CT findings 8/1 concerning for ischemic bowel.  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  Hx of remote MCA stroke Asthma hx as a child Epilepsy on keppra GERD T2DM on metformin as outpt HTN HLD OSA  Significant Hospital Events:  7/27 Transferred to [facility], stopped abx as sepsis resolved/ruled out 7/30 AM Worsened AME, more hypoxic with worsening renal funciton, new aspiration on abx restarted 7/30 PM Pt with increased o2 requirement on 100% FIO2 on BIPAP, d/w family who wanted to proceed with intubation, pt intubated 8/1 Some issues with tachycardia and hypotension overnight. Reproted high ostomy output  8/2 down to 9 mcg of Levophed, however repeat CT abdomen/pelvis showed inflammatory wall thickening and fat stranding of the descending and sigmoid colon, new since prior, consistent with nonspecific infectious, inflammatory, or ischemic colitis.  Also new dense consolidative airspace opacity in the right lower and right middle lobes 8/3  further weaning NE. Weaning sedation. Full code, full scope per family meeting 8/2  8/4 off pressors, plt down to 103.  8/5 remains off pressors. Moving around more, not really following commands though  8/9-failed extubation 8/8, tolerated nasal cannula for about an hour, desaturations and inability to clear secretions-reintubated 8/8 8/10 some agitation overnight 8/11 tracheostomy placement  Micro: Resp viral panel 7/24 > neg covid/ flu BC  7/24  x 2 neg  MRSA  7/25 neg  UC   7/25  neg  C diff screeen 7/26  neg ET culture 7/31 > moderate yeast> grew mod candida tropicalis GI panel  8/1> neg   Abx: Unasyn 7/30 >>7/31 Maxepime 7/31 >> 8/7 Flagyl 7/31 >>  8/7 Vancomycin 8/1 only  Anidulafungin 8/1>>8/3    Scheduled Meds:  albuterol  2.5 mg Nebulization QID   atorvastatin  20 mg Per Tube Daily   chlorhexidine gluconate (MEDLINE KIT)  15 mL Mouth Rinse BID   Chlorhexidine Gluconate Cloth  6 each Topical Daily   clonazepam  1 mg Per Tube TID   cloNIDine  0.1 mg Per Tube TID   docusate  100 mg Per Tube BID   etomidate  40 mg Intravenous Once   famotidine  20 mg Per Tube Daily   feeding supplement (PROSource TF)  45 mL Per Tube BID   heparin sodium (porcine)       hydrocerin   Topical BID   insulin aspart  0-15 Units Subcutaneous Q4H   insulin detemir  8 Units Subcutaneous BID   mouth rinse  15 mL Mouth Rinse QID   midodrine  5 mg Per Tube Q8H   OLANZapine zydis  5 mg Oral QHS  polyethylene glycol  17 g Per Tube Daily   Continuous Infusions:  sodium chloride 10 mL/hr at 12/19/20 0500   dexmedetomidine (PRECEDEX) IV infusion 0.8 mcg/kg/hr (12/19/20 0651)   feeding supplement (VITAL 1.5 CAL)     heparin 950 Units/hr (12/19/20 0522)   levETIRAcetam     norepinephrine (LEVOPHED) Adult infusion Stopped (12/18/20 1905)   promethazine (PHENERGAN) injection (IM or IVPB)     propofol (DIPRIVAN) infusion 50 mcg/kg/min (12/18/20 1604)   thiamine injection 250 mg (12/19/20 0944)    PRN Meds:.sodium chloride, acetaminophen, dextrose, docusate, fentaNYL (SUBLIMAZE) injection, fentaNYL (SUBLIMAZE) injection, methocarbamol, promethazine (PHENERGAN) injection (IM or IVPB), sodium chloride flush    Interim History / Subjective:  On precedex this morning, failed SBT for apnea.   Objective   Blood pressure 97/72, pulse 62, temperature (!) 97.3 F (36.3 C), temperature source Oral, resp. rate 20, height 5' 8" (1.727 m), weight 107.1 kg, SpO2 98 %.    Vent Mode: PRVC FiO2 (%):  [40 %-100 %] 40 % Set Rate:  [20 bmp] 20 bmp Vt Set:  [550 mL] 550 mL PEEP:  [5 cmH20] 5 cmH20 Pressure Support:  [10 cmH20] 10 cmH20 Plateau Pressure:  [15 cmH20-23 cmH20] 18 cmH20   Intake/Output Summary (Last 24 hours) at 12/19/2020 1343 Last data filed at 12/19/2020 0530 Gross per 24 hour  Intake 1019.13 ml  Output 750 ml  Net 269.13 ml   Filed Weights   12/17/20 0349 12/18/20 1157 12/19/20 1054  Weight: 106 kg 109.5 kg 107.1 kg   Afebrile General: appearance: chronically ill appearing, sedated HEENT: Trach to vent CV: RRR no mrg Respiratory: diminished bilaterally, no wheezes or crackles Abdominal: soft Neuro: withdraws to pain Extremities: warm  Resolved Hospital Problem list   Shock  HHS Afib RVR  Lactic Acidosis    Assessment & Plan:   Altered mental status Metabolic encephalopathy History of CVA in 2015 History of seizure disorder - Continue with Keppra. reducing AED dosing for renal function. - Continue to wean sedation as tolerated. PAD protocol. Currently off sedation this am for SAT. - reducing AED dosing for renal function  Acute hypoxemic respiratory failure Aspiration pneumonia Patient had failed extubation 8/8 secondary to increased secretions -> low O2 sats. Tracheostomy performed 8/11 due to excessive secretions, difficult vent wean. Completed course of antibiotics on 8/7 - last chest xray (8/8) without focal lung disease. - Continue pulmonary  hygiene - will attempt TC trials as tolerated, currently apneic on SBT, will hold precedex and try again  Colitis Completed abx 8/7. -Remains on tube feeds -Monitor ostomy output  AKI on chronic kidney disease stage IIIb, now HD dependent Management per nephrology.  CRRT off Now getting intermittent HD. Awaiting renal recovery from ischemic ATN from sepsis. May require long term HD.   Stage IIIb transverse colon cancer S/p partial transverse colectomy, end colectomy On CAPOX -Has had 3 cycles 6/1, 6/22, 7/13 -He follows with Dr. Ammie Dalton  Chronic heart failure with preserved ejection fraction -Last ejection fraction of 60 to 65% on last echo  Thrombocytopenia -Continue to monitor, overall improving since admission.  Hypertension Has been normotensive. No further management at this time. - Continue to hold home amlodipine.  Hyperlipidemia Has not been on home lipitor 20 mg. Will start back today.  Type 2 diabetes -Continue SSI - maintain goal CBG 140-180  Capecitabine induced desquamation Hand/foot syndrome -Continue moisturizer to hands and feet and scrotum  Goals of care discussion 8/2 -Continue full scope of care  Best Practice  Diet/type: tf per nutrition. Will have cortrack placed today.  DVT prophylaxis: SCD/ heparin GI prophylaxis: H2B Lines: Port, femoral trialysis,  Foley:  N/A Code Status:  full code Last date of goals of care discussion : 8/2 : PCCM Palli, RN, family -- full code full scope    The patient is critically ill due to respiratory failure, encephalopathy.  Critical care was necessary to treat or prevent imminent or life-threatening deterioration.  Critical care was time spent personally by me on the following activities: development of treatment plan with patient and/or surrogate as well as nursing, discussions with consultants, evaluation of patient's response to treatment, examination of patient, obtaining history from patient or surrogate,  ordering and performing treatments and interventions, ordering and review of laboratory studies, ordering and review of radiographic studies, pulse oximetry, re-evaluation of patient's condition and participation in multidisciplinary rounds.   Critical Care Time devoted to patient care services described in this note is 31 minutes. This time reflects time of care of this [place] . This critical care time does not reflect separately billable procedures or procedure time, teaching time or supervisory time of PA/NP/Med student/Med Resident etc but could involve care discussion time.       Spero Geralds [place] [facility] 12/19/2020 1:45 PM  Pager: see AMION  If no response to pager , please call critical care on call (see AMION) until 7pm After 7:00 pm call Elink

## 2020-12-19 NOTE — Progress Notes (Signed)
ANTICOAGULATION CONSULT NOTE - Follow Up Consult  Pharmacy Consult for Heparin Indication: DVT  No Known Allergies  Patient Measurements: Height: 5\' 8"  (172.7 cm) Weight: 107.1 kg (236 lb 1.8 oz) IBW/kg (Calculated) : 68.4 Heparin Dosing Weight: 91 kg  Vital Signs: Temp: 97.3 F (36.3 C) (08/12 1112) Temp Source: Oral (08/12 1112) BP: 98/72 (08/12 1345) Pulse Rate: 62 (08/12 1244)  Labs: Recent Labs    12/16/20 1709 12/16/20 2122 12/17/20 0355 12/17/20 1103 12/18/20 0433 12/19/20 0354 12/19/20 1224  HGB  --    < > 8.3*  --  8.0*  8.3* 7.7*  --   HCT  --   --  26.2*  --  25.8*  25.6* 23.4*  --   PLT  --   --  125*  --  154  144* 149*  --   APTT 151*  --   --   --   --   --   --   HEPARINUNFRC  --    < > 1.03*   < > 0.49 0.23* 0.45  CREATININE  --   --   --   --  3.85* 4.78*  --    < > = values in this interval not displayed.     Estimated Creatinine Clearance: 18.5 mL/min (A) (by C-G formula based on SCr of 4.78 mg/dL (H)).   Medications:  Infusions:   sodium chloride 10 mL/hr at 12/19/20 0500   dexmedetomidine (PRECEDEX) IV infusion 0.8 mcg/kg/hr (12/19/20 0651)   feeding supplement (VITAL 1.5 CAL)     heparin 950 Units/hr (12/19/20 0522)   levETIRAcetam     norepinephrine (LEVOPHED) Adult infusion Stopped (12/18/20 1905)   promethazine (PHENERGAN) injection (IM or IVPB)     propofol (DIPRIVAN) infusion 50 mcg/kg/min (12/18/20 1604)   thiamine injection 250 mg (12/19/20 0944)    Assessment: 80 yom admitted on 7/24 with high ostomy output, sepsis vs chemotherapy reaction; developed respiratory failure, aspiration, AKI, ischemic colitis.  He was initially on prophylactic heparin, then apixaban for Afib, then pharmacy was consulted to dose heparin IV for afib, then stopped for bleeding on 8/2 (also back in NSR), but remained on heparin in CRRT circuit.  Pharmacy was consulted for Argatroban for DVT on 8/5 due to concern for HITT.  Pharmacy consulted on 8/9 to  change back to heparin with negative HIT Ab and SRA.  - 8/5 HIT antb: 0.144, negative (0 - 0.400) - 8/5 SRA: negative   8/12 update:  Heparin level therapeutic @ 0.45 on 950 units/hr  Goal of Therapy:  Heparin level 0.3-0.7 units/mL Monitor platelets by anticoagulation protocol: Yes   Plan:  -Continue heparin at 950 units/hr -Confirmatory heparin level in 8 hours - daily heparin level and CBC  Alanda Slim, PharmD, [facility] Clinical Pharmacist Please see AMION for all Pharmacists' Contact Phone Numbers 12/19/2020, 2:08 PM

## 2020-12-19 NOTE — Progress Notes (Signed)
ANTICOAGULATION CONSULT NOTE - Follow Up Consult  Pharmacy Consult for Heparin Indication: DVT  No Known Allergies  Patient Measurements: Height: 5\' 8"  (172.7 cm) Weight: 109.5 kg (241 lb 6.5 oz) IBW/kg (Calculated) : 68.4 Heparin Dosing Weight: 91 kg  Vital Signs: Temp: 97.2 F (36.2 C) (08/12 0330) Temp Source: Axillary (08/12 0330) BP: 107/71 (08/12 0415) Pulse Rate: 62 (08/12 0415)  Labs: Recent Labs    12/16/20 1709 12/16/20 2122 12/17/20 0355 12/17/20 1103 12/17/20 1844 12/18/20 0433 12/19/20 0354  HGB  --    < > 8.3*  --   --  8.0*  8.3* 7.7*  HCT  --   --  26.2*  --   --  25.8*  25.6* 23.4*  PLT  --   --  125*  --   --  154  144* 149*  APTT 151*  --   --   --   --   --   --   HEPARINUNFRC  --    < > 1.03*   < > 1.00* 0.49 0.23*  CREATININE  --   --   --   --   --  3.85* 4.78*   < > = values in this interval not displayed.     Estimated Creatinine Clearance: 18.7 mL/min (A) (by C-G formula based on SCr of 4.78 mg/dL (H)).   Medications:  Infusions:   sodium chloride 10 mL/hr at 12/19/20 0400   dexmedetomidine (PRECEDEX) IV infusion 0.8 mcg/kg/hr (12/19/20 0400)   feeding supplement (VITAL AF 1.2 CAL) Stopped (12/18/20 1300)   heparin 850 Units/hr (12/19/20 0400)   levETIRAcetam Stopped (12/18/20 2152)   norepinephrine (LEVOPHED) Adult infusion Stopped (12/18/20 1905)   promethazine (PHENERGAN) injection (IM or IVPB)     propofol (DIPRIVAN) infusion 50 mcg/kg/min (12/18/20 1604)   thiamine injection Stopped (12/19/20 0030)    Assessment: 75 yom admitted on 7/24 with high ostomy output, sepsis vs chemotherapy reaction; developed respiratory failure, aspiration, AKI, ischemic colitis.  He was initially on prophylactic heparin, then apixaban for Afib, then pharmacy was consulted to dose heparin IV for afib, then stopped for bleeding on 8/2 (also back in NSR), but remained on heparin in CRRT circuit.  Pharmacy was consulted for Argatroban for DVT on 8/5  due to concern for HITT.  Pharmacy consulted on 8/9 to change back to heparin with negative HIT Ab and SRA.  - 8/5 HIT antb: 0.144, negative (0 - 0.400) - 8/5 SRA: negative   8/12 AM update:  Heparin level sub-therapeutic   Goal of Therapy:  Heparin level 0.3-0.7 units/mL Monitor platelets by anticoagulation protocol: Yes   Plan:  -Inc heparin to 950 units/hr -1300 heparin level  Narda Bonds, PharmD, BCPS Clinical Pharmacist Phone: [phone]

## 2020-12-20 DIAGNOSIS — T451X5A Adverse effect of antineoplastic and immunosuppressive drugs, initial encounter: Secondary | ICD-10-CM | POA: Diagnosis not present

## 2020-12-20 DIAGNOSIS — N171 Acute kidney failure with acute cortical necrosis: Secondary | ICD-10-CM | POA: Diagnosis not present

## 2020-12-20 DIAGNOSIS — E1100 Type 2 diabetes mellitus with hyperosmolarity without nonketotic hyperglycemic-hyperosmolar coma (NKHHC): Secondary | ICD-10-CM | POA: Diagnosis not present

## 2020-12-20 DIAGNOSIS — Z930 Tracheostomy status: Secondary | ICD-10-CM

## 2020-12-20 DIAGNOSIS — G9341 Metabolic encephalopathy: Secondary | ICD-10-CM | POA: Diagnosis not present

## 2020-12-20 LAB — HEPARIN LEVEL (UNFRACTIONATED): Heparin Unfractionated: 0.31 IU/mL (ref 0.30–0.70)

## 2020-12-20 LAB — RENAL FUNCTION PANEL
Albumin: 1.7 g/dL — ABNORMAL LOW (ref 3.5–5.0)
Anion gap: 12 (ref 5–15)
BUN: 49 mg/dL — ABNORMAL HIGH (ref 8–23)
CO2: 19 mmol/L — ABNORMAL LOW (ref 22–32)
Calcium: 7.1 mg/dL — ABNORMAL LOW (ref 8.9–10.3)
Chloride: 101 mmol/L (ref 98–111)
Creatinine, Ser: 3.33 mg/dL — ABNORMAL HIGH (ref 0.61–1.24)
GFR, Estimated: 20 mL/min — ABNORMAL LOW (ref >60)
Glucose, Bld: 340 mg/dL — ABNORMAL HIGH (ref 70–99)
Phosphorus: 4.8 mg/dL — ABNORMAL HIGH (ref 2.5–4.6)
Potassium: 3.5 mmol/L (ref 3.5–5.1)
Sodium: 132 mmol/L — ABNORMAL LOW (ref 135–145)

## 2020-12-20 LAB — CBC
HCT: 24.2 % — ABNORMAL LOW (ref 39.0–52.0)
Hemoglobin: 8 g/dL — ABNORMAL LOW (ref 13.0–17.0)
MCH: 29 pg (ref 26.0–34.0)
MCHC: 33.1 g/dL (ref 30.0–36.0)
MCV: 87.7 fL (ref 80.0–100.0)
Platelets: 172 10*3/uL (ref 150–400)
RBC: 2.76 MIL/uL — ABNORMAL LOW (ref 4.22–5.81)
RDW: 22.5 % — ABNORMAL HIGH (ref 11.5–15.5)
WBC: 11.9 10*3/uL — ABNORMAL HIGH (ref 4.0–10.5)
nRBC: 0 % (ref 0.0–0.2)

## 2020-12-20 LAB — GLUCOSE, CAPILLARY
Glucose-Capillary: 162 mg/dL — ABNORMAL HIGH (ref 70–99)
Glucose-Capillary: 202 mg/dL — ABNORMAL HIGH (ref 70–99)
Glucose-Capillary: 259 mg/dL — ABNORMAL HIGH (ref 70–99)
Glucose-Capillary: 260 mg/dL — ABNORMAL HIGH (ref 70–99)
Glucose-Capillary: 296 mg/dL — ABNORMAL HIGH (ref 70–99)
Glucose-Capillary: 332 mg/dL — ABNORMAL HIGH (ref 70–99)

## 2020-12-20 LAB — HEPATITIS B SURFACE ANTIBODY, QUANTITATIVE: Hep B S AB Quant (Post): <3.1 m[IU]/mL — ABNORMAL LOW (ref >9.9)

## 2020-12-20 MED ORDER — INSULIN DETEMIR 100 UNIT/ML ~~LOC~~ SOLN
15.0000 [IU] | Freq: Two times a day (BID) | SUBCUTANEOUS | Status: DC
  Administered 2020-12-20 (×2): 15 [IU] via SUBCUTANEOUS
  Filled 2020-12-20 (×3): qty 0.15

## 2020-12-20 MED ORDER — INSULIN ASPART 100 UNIT/ML IJ SOLN
2.0000 [IU] | INTRAMUSCULAR | Status: DC
  Administered 2020-12-20 – 2020-12-21 (×7): 2 [IU] via SUBCUTANEOUS

## 2020-12-20 NOTE — Progress Notes (Signed)
[facility] Progress Note  Subjective:  First HD on 8/12 with 1 kg UF.   No uop over 8/12. Appears was started on midodrine on 8/12.  Note he has also been on clonidine which has been intermittently held past few days.  He has been getting chest PT this am.   Review of systems:  Unable to obtain 2/2 mechanical ventilation    Vitals:   12/20/20 0322 12/20/20 0400 12/20/20 0500 12/20/20 0600  BP:  113/76 104/70 109/76  Pulse: 70 71 65 70  Resp: 20 20 (!) 24 20  Temp: (!) 97.2 F (36.2 C)     TempSrc: Axillary     SpO2: 100% 100% 100% 99%  Weight:  107.3 kg    Height:        Exam:  General adult male in bed critically ill - getting chest pt HEENT normocephalic atraumatic  Neck on vent via trach Lungs lung sounds obscured by chest PT; fio2 40 and peep 5 Heart S1S2 no rub Abdomen soft nontender with limit of sedation nondistended Extremities 2+ edema  Neuro - continuous sedation running; opens eyes to exam Access left femoral catheter nontunneled in place   Assessment/ Plan: AKI/CKD stage IIIb - ischemic ATN in setting of severe sepsis and multisystem organ failure.  Creat 2.1 on admission and rose to peak 5.20 when CRRT initiated 7/31.  Appears CRRT stopped after 8/9.  L fem cath placed 12/07/20.  First iHD on 8/12 Plan for next HD on 8/15. Assess needs daily Note femoral nontunneled catheter for access and will need to transition to tunneled catheter next week if remains dependent on renal replacement therapy  AMS - hx seizure d/o, hx CVA 2015. Per CCM.  Volume overload -  anasarca resolved Sepsis/aspiration pneumonia - Off pressors and SP course of  IV maxipime/vanc.  Stop clonidine and stop midodrine. Acute hypoxic respiratory failure - optimize volume with HD.  Vent per pulm.  Stage IIIB colon cancer - sp partial colectomy, colostomy. On chemoRx sp 3 cycles Colitis - completed abx 8/7.  Right leg DVT - anticoagulation per primary team  Thrombocytopenia -  improving  Atrial fibrillation - s/p amiodarone, dilt; per primary  Anemia of critical illness- transfuse prn. Defer ESA - note malignancy Hyperphosphatemia - improved with HD. Changed to nepro    Recent Labs  Lab 12/19/20 0354 12/20/20 0356  K 4.4 3.5  BUN 106* 49*  CREATININE 4.78* 3.33*  CALCIUM 7.5* 7.1*  PHOS 8.6* 4.8*  HGB 7.7* 8.0*   Inpatient medications:  albuterol  2.5 mg Nebulization QID   atorvastatin  20 mg Per Tube Daily   chlorhexidine gluconate (MEDLINE KIT)  15 mL Mouth Rinse BID   Chlorhexidine Gluconate Cloth  6 each Topical Q1200   clonazepam  1 mg Per Tube TID   cloNIDine  0.1 mg Per Tube TID   docusate  100 mg Per Tube BID   etomidate  40 mg Intravenous Once   famotidine  20 mg Per Tube Daily   feeding supplement (PROSource TF)  45 mL Per Tube BID   hydrocerin   Topical BID   insulin aspart  0-15 Units Subcutaneous Q4H   insulin detemir  8 Units Subcutaneous BID   mouth rinse  15 mL Mouth Rinse QID   midodrine  5 mg Per Tube Q8H   OLANZapine zydis  5 mg Oral QHS   polyethylene glycol  17 g Per Tube Daily    sodium chloride 10 mL/hr  at 12/20/20 0600   dexmedetomidine (PRECEDEX) IV infusion 0.8 mcg/kg/hr (12/20/20 0600)   feeding supplement (VITAL 1.5 CAL) 1,000 mL (12/20/20 0601)   heparin 950 Units/hr (12/20/20 0600)   levETIRAcetam Stopped (12/19/20 2116)   norepinephrine (LEVOPHED) Adult infusion Stopped (12/18/20 1905)   promethazine (PHENERGAN) injection (IM or IVPB)     propofol (DIPRIVAN) infusion 50 mcg/kg/min (12/18/20 1604)   thiamine injection Stopped (12/19/20 2135)   sodium chloride, acetaminophen, dextrose, docusate, fentaNYL (SUBLIMAZE) injection, fentaNYL (SUBLIMAZE) injection, methocarbamol, promethazine (PHENERGAN) injection (IM or IVPB), sodium chloride flush   Claudia Desanctis, MD 7:37 AM 12/20/2020

## 2020-12-20 NOTE — Progress Notes (Signed)
RT note-Patient was placed on wean, tolerated well but after 10 minutes went apneic and placed back to full support.

## 2020-12-20 NOTE — Progress Notes (Signed)
NAME:  Thomas [name], MRN:  [id number], DOB:  Oct 31, 1955, LOS: 69 ADMISSION DATE:  11/30/2020, CONSULTATION DATE:  7/30 REFERRING MD:  Riccardo Dubin, CHIEF COMPLAINT:  Worsening hypoxia/dyspnea  History of Present Illness:  65 yo M admitted for worsening sepsis vs reaction to chemotherapy with high ostomy output and soft BP on 7/25. Has had issues during his hospitalization with recurrent aspirations and more recently worsening respiratory status.  Most recently pt developed worsening hypoxia and was requiring BIPAP w/ FIO2 of 100% and increased work of breathing. Unable to obtain ABG, CXR in AM of 7/30 showed some worsening concern of aspiration.   Who was admitted to intensive care and subsequently had worsening clinical status requiring intubation, CRRT, multiple pressors and pneumonia along with CT findings 8/1 concerning for ischemic bowel.  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  Hx of remote MCA stroke Asthma hx as a child Epilepsy on keppra GERD T2DM on metformin as outpt HTN HLD OSA  Significant Hospital Events:  7/27 Transferred to [facility], stopped abx as sepsis resolved/ruled out 7/30 AM Worsened AME, more hypoxic with worsening renal funciton, new aspiration on abx restarted 7/30 PM Pt with increased o2 requirement on 100% FIO2 on BIPAP, d/w family who wanted to proceed with intubation, pt intubated 8/1 Some issues with tachycardia and hypotension overnight. Reproted high ostomy output  8/2 down to 9 mcg of Levophed, however repeat CT abdomen/pelvis showed inflammatory wall thickening and fat stranding of the descending and sigmoid colon, new since prior, consistent with nonspecific infectious, inflammatory, or ischemic colitis.  Also new dense consolidative airspace opacity in the right lower and right middle lobes 8/3  further weaning NE. Weaning sedation. Full code, full scope per family meeting 8/2  8/4 off pressors, plt down to 103.  8/5 remains off pressors. Moving around more, not really following commands though  8/9-failed extubation 8/8, tolerated nasal cannula for about an hour, desaturations and inability to clear secretions-reintubated 8/8 8/10 some agitation overnight 8/11 tracheostomy placement  Micro: Resp viral panel 7/24 > neg covid/ flu BC  7/24  x 2 neg  MRSA  7/25 neg  UC   7/25  neg  C diff screeen 7/26  neg ET culture 7/31 > moderate yeast> grew mod candida tropicalis GI panel  8/1> neg   Abx: Unasyn 7/30 >>7/31 Maxepime 7/31 >> 8/7 Flagyl 7/31 >>  8/7 Vancomycin 8/1 only  Anidulafungin 8/1>>8/3    Scheduled Meds:  albuterol  2.5 mg Nebulization QID   atorvastatin  20 mg Per Tube Daily   chlorhexidine gluconate (MEDLINE KIT)  15 mL Mouth Rinse BID   Chlorhexidine Gluconate Cloth  6 each Topical Q1200   clonazepam  1 mg Per Tube TID   docusate  100 mg Per Tube BID   etomidate  40 mg Intravenous Once   famotidine  20 mg Per Tube Daily   feeding supplement (PROSource TF)  45 mL Per Tube BID   hydrocerin   Topical BID   insulin aspart  0-15 Units Subcutaneous Q4H   insulin detemir  8 Units Subcutaneous BID   mouth rinse  15 mL Mouth Rinse QID   OLANZapine zydis  5 mg Oral QHS   polyethylene glycol  17 g Per Tube Daily   Continuous Infusions:  sodium chloride 10 mL/hr at 12/20/20 0800   dexmedetomidine (PRECEDEX) IV  infusion 0.8 mcg/kg/hr (12/20/20 0800)   feeding supplement (VITAL 1.5 CAL) 1,000 mL (12/20/20 0601)   heparin 950 Units/hr (12/20/20 0800)   levETIRAcetam Stopped (12/19/20 2116)   norepinephrine (LEVOPHED) Adult infusion Stopped (12/18/20 1905)   promethazine (PHENERGAN) injection (IM or IVPB)     propofol (DIPRIVAN) infusion 50 mcg/kg/min (12/18/20 1604)   thiamine injection Stopped (12/19/20 2135)   PRN Meds:.sodium chloride, acetaminophen,  dextrose, docusate, fentaNYL (SUBLIMAZE) injection, fentaNYL (SUBLIMAZE) injection, methocarbamol, promethazine (PHENERGAN) injection (IM or IVPB), sodium chloride flush    Interim History / Subjective:  On precedex this morning, failed SBT for apnea.   Objective   Blood pressure 112/70, pulse 84, temperature (!) 97.4 F (36.3 C), temperature source Axillary, resp. rate (!) 22, height 5' 8"  (1.727 m), weight 107.3 kg, SpO2 96 %.    Vent Mode: PRVC FiO2 (%):  [40 %] 40 % Set Rate:  [20 bmp] 20 bmp Vt Set:  [550 mL] 550 mL PEEP:  [5 cmH20] 5 cmH20 Pressure Support:  [12 cmH20] 12 cmH20 Plateau Pressure:  [17 cmH20-18 cmH20] 18 cmH20   Intake/Output Summary (Last 24 hours) at 12/20/2020 0852 Last data filed at 12/20/2020 0800 Gross per 24 hour  Intake 2546.92 ml  Output 2663 ml  Net -116.08 ml   Filed Weights   12/18/20 1157 12/19/20 1054 12/20/20 0400  Weight: 109.5 kg 107.1 kg 107.3 kg   Afebrile.  Chronically ill appearing Cortrack in place Breathing nonlabored Not responsive, does not follow commands Abdomen soft, nontender - colostomy output brown, stable RRR, no mrg No edema Right IJ HD catheter in place  Resolved Hospital Problem list   Shock  HHS Afib RVR  Lactic Acidosis  Thrombocytopenia colitis  Assessment & Plan:   Altered mental status Metabolic encephalopathy History of CVA in 2015 History of seizure disorder - Continue with Keppra. reducing AED dosing for renal function. - Continue to wean sedation as tolerated. PAD protocol  - reducing AED dosing for renal function - continue precedex for goal RASS 0 to -1. Discussed lightening up tfor SAT since he went apneic on SBT.   Acute hypoxemic respiratory failure Aspiration pneumonia Patient had failed extubation 8/8 secondary to increased secretions -> low O2 sats. Tracheostomy performed 8/11 due to excessive secretions, difficult vent wean. Completed course of antibiotics on 8/7 - last chest xray (8/8)  without focal lung disease. - Continue pulmonary hygiene - TC trials as tolerated   AKI on chronic kidney disease stage IIIb, now HD dependent Management per nephrology.  Last HD on 8/12, added midodrine for hypotension with dialysis.  - will need tunnled catheter this week, currently with temporary trialysis catheter.   Stage IIIb transverse colon cancer S/p partial transverse colectomy, end colectomy On CAPOX -Has had 3 cycles 6/1, 6/22, 7/13 -He follows with Dr. Ammie Dalton  Chronic heart failure with preserved ejection fraction -Last ejection fraction of 60 to 65% on last echo  Hypertension -stopped home anti-hypertensives due to hypotension, now on midodrine  Hyperlipidemia Continue home lipitor  Type 2 diabetes -Continue SSI, add tube feed coverage today, increase levemir.  - maintain goal CBG 140-180  Capecitabine induced desquamation Hand/foot syndrome -Continue moisturizer to hands and feet and scrotum   Best Practice  Diet/type: tf per nutrition. Will have cortrack placed today.  DVT prophylaxis: SCD/ heparin GI prophylaxis: H2B Lines: Port, femoral trialysis Foley:  N/A Code Status:  full code Last date of goals of care discussion : 8/2 : PCCM Palli, RN, family -- full  code full scope    The patient is critically ill due to respiratory failure, encephalopathy.  Critical care was necessary to treat or prevent imminent or life-threatening deterioration.  Critical care was time spent personally by me on the following activities: development of treatment plan with patient and/or surrogate as well as nursing, discussions with consultants, evaluation of patient's response to treatment, examination of patient, obtaining history from patient or surrogate, ordering and performing treatments and interventions, ordering and review of laboratory studies, ordering and review of radiographic studies, pulse oximetry, re-evaluation of patient's condition and participation in  multidisciplinary rounds.   Critical Care Time devoted to patient care services described in this note is 32 minutes. This time reflects time of care of this [place] . This critical care time does not reflect separately billable procedures or procedure time, teaching time or supervisory time of PA/NP/Med student/Med Resident etc but could involve care discussion time.       Spero Geralds [facility] 12/20/2020 8:52 AM  Pager: see AMION  If no response to pager , please call critical care on call (see AMION) until 7pm After 7:00 pm call Elink

## 2020-12-20 NOTE — Progress Notes (Signed)
ANTICOAGULATION CONSULT NOTE - Follow Up Consult  Pharmacy Consult for Heparin Indication: DVT  No Known Allergies  Patient Measurements: Height: 5\' 8"  (172.7 cm) Weight: 107.3 kg (236 lb 8.9 oz) IBW/kg (Calculated) : 68.4 Heparin Dosing Weight: 91 kg  Vital Signs: Temp: 98.3 F (36.8 C) (08/13 1119) Temp Source: Axillary (08/13 1119) BP: 115/72 (08/13 1000) Pulse Rate: 81 (08/13 1134)  Labs: Recent Labs    12/18/20 0433 12/19/20 0354 12/19/20 1224 12/19/20 2031 12/20/20 0356  HGB 8.0*  8.3* 7.7*  --   --  8.0*  HCT 25.8*  25.6* 23.4*  --   --  24.2*  PLT 154  144* 149*  --   --  172  HEPARINUNFRC 0.49 0.23* 0.45 0.50 0.31  CREATININE 3.85* 4.78*  --   --  3.33*     Estimated Creatinine Clearance: 26.6 mL/min (A) (by C-G formula based on SCr of 3.33 mg/dL (H)).   Medications:  Infusions:   sodium chloride Stopped (12/20/20 0955)   dexmedetomidine (PRECEDEX) IV infusion Stopped (12/20/20 0958)   feeding supplement (VITAL 1.5 CAL) 1,000 mL (12/20/20 0601)   heparin 950 Units/hr (12/20/20 1100)   levETIRAcetam Stopped (12/20/20 0955)   norepinephrine (LEVOPHED) Adult infusion Stopped (12/18/20 1905)   promethazine (PHENERGAN) injection (IM or IVPB)     propofol (DIPRIVAN) infusion 50 mcg/kg/min (12/18/20 1604)   thiamine injection Stopped (12/20/20 1005)    Assessment: 41 yom admitted on 7/24 with high ostomy output, sepsis vs chemotherapy reaction; developed respiratory failure, aspiration, AKI, ischemic colitis.  He was initially on prophylactic heparin, then apixaban for Afib, then pharmacy was consulted to dose heparin IV for afib, then stopped for bleeding on 8/2 (also back in NSR), but remained on heparin in CRRT circuit.  Pharmacy was consulted for Argatroban for DVT on 8/5 due to concern for HITT.  Pharmacy consulted on 8/9 to change back to heparin with negative HIT Ab and SRA.  - 8/5 HIT antb: 0.144, negative (0 - 0.400) - 8/5 SRA: negative  Hep lvl  at goal  Goal of Therapy:  Heparin level 0.3-0.7 units/mL Monitor platelets by anticoagulation protocol: Yes   Plan:  -Continue heparin at 950 units/hr - daily heparin level and CBC  Barth Kirks, PharmD, [place] Pharmacist [phone]  Please check AMION for all [place] numbers  12/20/2020 12:27 PM

## 2020-12-21 DIAGNOSIS — G9341 Metabolic encephalopathy: Secondary | ICD-10-CM | POA: Diagnosis not present

## 2020-12-21 DIAGNOSIS — N170 Acute kidney failure with tubular necrosis: Secondary | ICD-10-CM | POA: Diagnosis not present

## 2020-12-21 DIAGNOSIS — E1100 Type 2 diabetes mellitus with hyperosmolarity without nonketotic hyperglycemic-hyperosmolar coma (NKHHC): Secondary | ICD-10-CM | POA: Diagnosis not present

## 2020-12-21 DIAGNOSIS — J9601 Acute respiratory failure with hypoxia: Secondary | ICD-10-CM | POA: Diagnosis not present

## 2020-12-21 LAB — COMPREHENSIVE METABOLIC PANEL
ALT: 22 U/L (ref 0–44)
AST: 16 U/L (ref 15–41)
Albumin: 1.5 g/dL — ABNORMAL LOW (ref 3.5–5.0)
Alkaline Phosphatase: 72 U/L (ref 38–126)
Anion gap: 11 (ref 5–15)
BUN: 58 mg/dL — ABNORMAL HIGH (ref 8–23)
CO2: 20 mmol/L — ABNORMAL LOW (ref 22–32)
Calcium: 6.9 mg/dL — ABNORMAL LOW (ref 8.9–10.3)
Chloride: 106 mmol/L (ref 98–111)
Creatinine, Ser: 4.61 mg/dL — ABNORMAL HIGH (ref 0.61–1.24)
GFR, Estimated: 13 mL/min — ABNORMAL LOW (ref >60)
Glucose, Bld: 335 mg/dL — ABNORMAL HIGH (ref 70–99)
Potassium: 4.2 mmol/L (ref 3.5–5.1)
Sodium: 137 mmol/L (ref 135–145)
Total Bilirubin: 1 mg/dL (ref 0.3–1.2)
Total Protein: 5.6 g/dL — ABNORMAL LOW (ref 6.5–8.1)

## 2020-12-21 LAB — GLUCOSE, CAPILLARY
Glucose-Capillary: 306 mg/dL — ABNORMAL HIGH (ref 70–99)
Glucose-Capillary: 303 mg/dL — ABNORMAL HIGH (ref 70–99)
Glucose-Capillary: 274 mg/dL — ABNORMAL HIGH (ref 70–99)
Glucose-Capillary: 160 mg/dL — ABNORMAL HIGH (ref 70–99)
Glucose-Capillary: 149 mg/dL — ABNORMAL HIGH (ref 70–99)
Glucose-Capillary: 207 mg/dL — ABNORMAL HIGH (ref 70–99)

## 2020-12-21 LAB — CBC
HCT: 22.9 % — ABNORMAL LOW (ref 39.0–52.0)
Hemoglobin: 7.3 g/dL — ABNORMAL LOW (ref 13.0–17.0)
MCH: 29.1 pg (ref 26.0–34.0)
MCHC: 31.9 g/dL (ref 30.0–36.0)
MCV: 91.2 fL (ref 80.0–100.0)
Platelets: 182 10*3/uL (ref 150–400)
RBC: 2.51 MIL/uL — ABNORMAL LOW (ref 4.22–5.81)
RDW: 21.9 % — ABNORMAL HIGH (ref 11.5–15.5)
WBC: 11.7 10*3/uL — ABNORMAL HIGH (ref 4.0–10.5)
nRBC: 0 % (ref 0.0–0.2)

## 2020-12-21 LAB — HEPARIN LEVEL (UNFRACTIONATED)
Heparin Unfractionated: 0.27 IU/mL — ABNORMAL LOW (ref 0.30–0.70)
Heparin Unfractionated: 0.18 IU/mL — ABNORMAL LOW (ref 0.30–0.70)

## 2020-12-21 MED ORDER — INSULIN ASPART 100 UNIT/ML IJ SOLN
6.0000 [IU] | INTRAMUSCULAR | Status: DC
  Administered 2020-12-21 – 2021-01-01 (×26): 6 [IU] via SUBCUTANEOUS

## 2020-12-21 MED ORDER — INSULIN DETEMIR 100 UNIT/ML ~~LOC~~ SOLN
20.0000 [IU] | Freq: Two times a day (BID) | SUBCUTANEOUS | Status: DC
  Filled 2020-12-21 (×2): qty 0.2

## 2020-12-21 MED ORDER — QUETIAPINE FUMARATE 25 MG PO TABS
25.0000 mg | ORAL_TABLET | Freq: Two times a day (BID) | ORAL | Status: DC
  Administered 2020-12-21 – 2020-12-22 (×4): 25 mg
  Filled 2020-12-21 (×4): qty 1

## 2020-12-21 MED ORDER — INSULIN DETEMIR 100 UNIT/ML ~~LOC~~ SOLN
25.0000 [IU] | Freq: Two times a day (BID) | SUBCUTANEOUS | Status: DC
  Administered 2020-12-21 – 2020-12-23 (×4): 25 [IU] via SUBCUTANEOUS
  Filled 2020-12-21 (×6): qty 0.25

## 2020-12-21 MED ORDER — CHLORHEXIDINE GLUCONATE CLOTH 2 % EX PADS: 6.0000 | MEDICATED_PAD | Freq: Every day | CUTANEOUS | Status: DC

## 2020-12-21 MED ORDER — ALBUTEROL SULFATE (2.5 MG/3ML) 0.083% IN NEBU
2.5000 mg | INHALATION_SOLUTION | RESPIRATORY_TRACT | Status: DC | PRN
  Administered 2020-12-27 – 2021-02-13 (×3): 2.5 mg via RESPIRATORY_TRACT
  Filled 2020-12-21 (×3): qty 3

## 2020-12-21 MED ORDER — CEFAZOLIN SODIUM-DEXTROSE 2-4 GM/100ML-% IV SOLN
2.0000 g | INTRAVENOUS | Status: AC
  Administered 2020-12-22: 2 g via INTRAVENOUS
  Filled 2020-12-21: qty 100

## 2020-12-21 NOTE — Progress Notes (Addendum)
[facility] Progress Note  Subjective:  Last HD on 8/12 with 1 kg UF (this was first HD tx).  No uop over 8/13.  No acute events overnight per nursing.  Spoke with his wife to update. She has agreed to tunneled catheter for hd  Review of systems:  Unable to obtain 2/2 mechanical ventilation    Vitals:   12/21/20 0431 12/21/20 0500 12/21/20 0530 12/21/20 0600  BP:  126/81 120/77 120/75  Pulse:  71 71 71  Resp:  _0 Temp:      TempSrc:      SpO2:  100% 99% 99%  Weight: 107.4 kg     Height:        Exam:  General adult male in bed critically ill  HEENT normocephalic atraumatic  Neck on vent via trach Lungs coarse mechanical breath sounds; fio2 40 and peep 5 Heart S1S2 no rub Abdomen soft nontender with limit of sedation nondistended Extremities 2+ edema  Neuro - no agitation; does not wake with exam Access left femoral catheter nontunneled in place   Assessment/ Plan: AKI/CKD stage IIIb - ischemic ATN in setting of severe sepsis and multisystem organ failure.  Creat 2.1 on admission and rose to peak 5.20 when CRRT initiated 7/31.  Appears CRRT stopped after 8/9.  L fem cath placed 12/07/20.  First iHD on 8/12 Plan for next HD on 8/15. Assess needs daily Note femoral nontunneled catheter for access and will need to transition to tunneled catheter  NPO after midnight tonight Consulted IR for tunneled catheter for 8/15 if possible AMS - hx seizure d/o, hx CVA 2015. Per CCM.  Volume overload -  anasarca resolved Sepsis/aspiration pneumonia - Off pressors and SP course of  IV maxipime/vanc.  I stopped clonidine and the concurrent midodrine. Acute hypoxic respiratory failure - optimize volume with HD.  Vent per pulm.  Stage IIIB colon cancer - sp partial colectomy, colostomy. On chemoRx sp 3 cycles Colitis - completed abx 8/7.  Right leg DVT - anticoagulation per primary team  Thrombocytopenia - resolved  Atrial fibrillation - s/p amiodarone, dilt; per  primary  Anemia of critical illness- transfuse prn. Defer ESA - note malignancy Hyperphosphatemia - improved with HD. Appears changed back from nepro.    Recent Labs  Lab 12/19/20 0354 12/20/20 0356 12/21/20 0427  K 4.4 3.5 4.2  BUN 106* 49* 58*  CREATININE 4.78* 3.33* 4.61*  CALCIUM 7.5* 7.1* 6.9*  PHOS 8.6* 4.8*  --   HGB 7.7* 8.0* 7.3*   Inpatient medications:  albuterol  2.5 mg Nebulization QID   atorvastatin  20 mg Per Tube Daily   chlorhexidine gluconate (MEDLINE KIT)  15 mL Mouth Rinse BID   Chlorhexidine Gluconate Cloth  6 each Topical Q1200   clonazepam  1 mg Per Tube TID   docusate  100 mg Per Tube BID   etomidate  40 mg Intravenous Once   famotidine  20 mg Per Tube Daily   feeding supplement (PROSource TF)  45 mL Per Tube BID   hydrocerin   Topical BID   insulin aspart  0-15 Units Subcutaneous Q4H   insulin aspart  2 Units Subcutaneous Q4H   insulin detemir  20 Units Subcutaneous BID   mouth rinse  15 mL Mouth Rinse QID   OLANZapine zydis  5 mg Oral QHS   polyethylene glycol  17 g Per Tube Daily    sodium chloride Stopped (12/20/20 0955)   dexmedetomidine (PRECEDEX) IV infusion 0.8  mcg/kg/hr (12/21/20 0600)   feeding supplement (VITAL 1.5 CAL) 1,000 mL (12/20/20 2200)   heparin 950 Units/hr (12/21/20 0600)   levETIRAcetam Stopped (12/20/20 2128)   norepinephrine (LEVOPHED) Adult infusion Stopped (12/18/20 1905)   promethazine (PHENERGAN) injection (IM or IVPB)     propofol (DIPRIVAN) infusion 50 mcg/kg/min (12/18/20 1604)   thiamine injection Stopped (12/20/20 2145)   sodium chloride, acetaminophen, dextrose, docusate, fentaNYL (SUBLIMAZE) injection, fentaNYL (SUBLIMAZE) injection, methocarbamol, promethazine (PHENERGAN) injection (IM or IVPB), sodium chloride flush   Claudia Desanctis, MD 12/21/2020 7:53 AM

## 2020-12-21 NOTE — Progress Notes (Signed)
ANTICOAGULATION CONSULT NOTE - Follow Up Consult  Pharmacy Consult for Heparin Indication: DVT  No Known Allergies  Patient Measurements: Height: 5\' 8"  (172.7 cm) Weight: 107.4 kg (236 lb 12.4 oz) IBW/kg (Calculated) : 68.4 Heparin Dosing Weight: 91 kg  Vital Signs: Temp: 98.1 F (36.7 C) (08/14 0741) Temp Source: Axillary (08/14 0741) BP: 114/70 (08/14 0900) Pulse Rate: 84 (08/14 0900)  Labs: Recent Labs    12/19/20 0354 12/19/20 1224 12/19/20 2031 12/20/20 0356 12/21/20 0427  HGB 7.7*  --   --  8.0* 7.3*  HCT 23.4*  --   --  24.2* 22.9*  PLT 149*  --   --  172 182  HEPARINUNFRC 0.23*   < > 0.50 0.31 0.27*  CREATININE 4.78*  --   --  3.33* 4.61*   < > = values in this interval not displayed.     Estimated Creatinine Clearance: 19.2 mL/min (A) (by C-G formula based on SCr of 4.61 mg/dL (H)).   Medications:  Infusions:   sodium chloride Stopped (12/20/20 0955)   dexmedetomidine (PRECEDEX) IV infusion 0.8 mcg/kg/hr (12/21/20 0600)   feeding supplement (VITAL 1.5 CAL) 1,000 mL (12/20/20 2200)   heparin 950 Units/hr (12/21/20 0600)   levETIRAcetam 500 mg (12/21/20 3244)   thiamine injection Stopped (12/20/20 2145)    Assessment: 65 yom admitted on 7/24 with high ostomy output, sepsis vs chemotherapy reaction; developed respiratory failure, aspiration, AKI, ischemic colitis.  He was initially on prophylactic heparin, then apixaban for Afib, then pharmacy was consulted to dose heparin IV for afib, then stopped for bleeding on 8/2 (also back in NSR), but remained on heparin in CRRT circuit.  Pharmacy was consulted for Argatroban for DVT on 8/5 due to concern for HITT.  Pharmacy consulted on 8/9 to change back to heparin with negative HIT Ab and SRA.  - 8/5 HIT antb: 0.144, negative (0 - 0.400) - 8/5 SRA: negative  Hep lvl slightly low this am CBC low but stable  Goal of Therapy:  Heparin level 0.3-0.7 units/mL Monitor platelets by anticoagulation protocol: Yes    Plan:  Increase heparin 1050 units/hr Repeat lvl in afternoon Daily hep lvl cbc  Barth Kirks, PharmD, [place] Pharmacist [phone]  Please check AMION for all [place] numbers  12/21/2020 9:31 AM

## 2020-12-21 NOTE — Progress Notes (Signed)
ANTICOAGULATION CONSULT NOTE - Follow Up Consult  Pharmacy Consult for Heparin Indication: DVT  No Known Allergies  Patient Measurements: Height: 5\' 8"  (172.7 cm) Weight: 107.4 kg (236 lb 12.4 oz) IBW/kg (Calculated) : 68.4 Heparin Dosing Weight: 91 kg  Vital Signs: Temp: 98.5 F (36.9 C) (08/14 1520) Temp Source: Oral (08/14 1520) BP: 106/62 (08/14 1800) Pulse Rate: 92 (08/14 1800)  Labs: Recent Labs    12/19/20 0354 12/19/20 1224 12/20/20 0356 12/21/20 0427 12/21/20 1727  HGB 7.7*  --  8.0* 7.3*  --   HCT 23.4*  --  24.2* 22.9*  --   PLT 149*  --  172 182  --   HEPARINUNFRC 0.23*   < > 0.31 0.27* 0.18*  CREATININE 4.78*  --  3.33* 4.61*  --    < > = values in this interval not displayed.     Estimated Creatinine Clearance: 19.2 mL/min (A) (by C-G formula based on SCr of 4.61 mg/dL (H)).   Medications:  Infusions:   sodium chloride Stopped (12/20/20 0955)   [START ON 12/22/2020]  ceFAZolin (ANCEF) IV     dexmedetomidine (PRECEDEX) IV infusion 0.3 mcg/kg/hr (12/21/20 1700)   feeding supplement (VITAL 1.5 CAL) 1,000 mL (12/20/20 2200)   heparin 1,050 Units/hr (12/21/20 1700)   levETIRAcetam Stopped (12/21/20 8502)   thiamine injection Stopped (12/21/20 1045)    Assessment: 27 yom admitted on 7/24 with high ostomy output, sepsis vs chemotherapy reaction; developed respiratory failure, aspiration, AKI, ischemic colitis.  He was initially on prophylactic heparin, then apixaban for Afib, then pharmacy was consulted to dose heparin IV for afib, then stopped for bleeding on 8/2 (also back in NSR), but remained on heparin in CRRT circuit.  Pharmacy was consulted for Argatroban for DVT on 8/5 due to concern for HITT.  Pharmacy consulted on 8/9 to change back to heparin with negative HIT Ab and SRA. -heparin level down to 0.18 after heparin increased to 1050 units/hr -hg= 7.3  Goal of Therapy:  Heparin level 0.3-0.7 units/mL Monitor platelets by anticoagulation  protocol: Yes   Plan:  -Increase heparin to 1250 units/hr -Heparin level in 8 hours and daily wth CBC daily  Hildred Laser, PharmD Clinical Pharmacist **Pharmacist phone directory can now be found on [email or web address] ([email or web address]).  Listed under [place].

## 2020-12-21 NOTE — Progress Notes (Signed)
RT note- Placed back to full support, increased RR's, RN aware, weaned 10 hours ATC, continue to monitor.

## 2020-12-21 NOTE — Progress Notes (Signed)
NAME:  Thomas [name], MRN:  [id number], DOB:  24-Feb-1956, LOS: 53 ADMISSION DATE:  11/30/2020, CONSULTATION DATE:  7/30 REFERRING MD:  Riccardo Dubin, CHIEF COMPLAINT:  Worsening hypoxia/dyspnea  History of Present Illness:  65 yo M admitted for worsening sepsis vs reaction to chemotherapy with high ostomy output and soft BP on 7/25. Has had issues during his hospitalization with recurrent aspirations and more recently worsening respiratory status.  Most recently pt developed worsening hypoxia and was requiring BIPAP w/ FIO2 of 100% and increased work of breathing. Unable to obtain ABG, CXR in AM of 7/30 showed some worsening concern of aspiration.   Who was admitted to intensive care and subsequently had worsening clinical status requiring intubation, CRRT, multiple pressors and pneumonia along with CT findings 8/1 concerning for ischemic bowel.  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  Hx of remote MCA stroke Asthma hx as a child Epilepsy on keppra GERD T2DM on metformin as outpt HTN HLD OSA  Significant Hospital Events:  7/27 Transferred to [facility], stopped abx as sepsis resolved/ruled out 7/30 AM Worsened AME, more hypoxic with worsening renal funciton, new aspiration on abx restarted 7/30 PM Pt with increased o2 requirement on 100% FIO2 on BIPAP, d/w family who wanted to proceed with intubation, pt intubated 8/1 Some issues with tachycardia and hypotension overnight. Reproted high ostomy output  8/2 down to 9 mcg of Levophed, however repeat CT abdomen/pelvis showed inflammatory wall thickening and fat stranding of the descending and sigmoid colon, new since prior, consistent with nonspecific infectious, inflammatory, or ischemic colitis.  Also new dense consolidative airspace opacity in the right lower and right middle lobes 8/3  further weaning NE. Weaning sedation. Full code, full scope per family meeting 8/2  8/4 off pressors, plt down to 103.  8/5 remains off pressors. Moving around more, not really following commands though  8/9-failed extubation 8/8, tolerated nasal cannula for about an hour, desaturations and inability to clear secretions-reintubated 8/8 8/10 some agitation overnight 8/11 tracheostomy placement  Micro: Resp viral panel 7/24 > neg covid/ flu BC  7/24  x 2 neg  MRSA  7/25 neg  UC   7/25  neg  C diff screeen 7/26  neg ET culture 7/31 > moderate yeast> grew mod candida tropicalis GI panel  8/1> neg   Abx: Unasyn 7/30 >>7/31 Maxepime 7/31 >> 8/7 Flagyl 7/31 >>  8/7 Vancomycin 8/1 only  Anidulafungin 8/1>>8/3    Scheduled Meds:  albuterol  2.5 mg Nebulization QID   atorvastatin  20 mg Per Tube Daily   chlorhexidine gluconate (MEDLINE KIT)  15 mL Mouth Rinse BID   Chlorhexidine Gluconate Cloth  6 each Topical Q1200   clonazepam  1 mg Per Tube TID   docusate  100 mg Per Tube BID   etomidate  40 mg Intravenous Once   famotidine  20 mg Per Tube Daily   feeding supplement (PROSource TF)  45 mL Per Tube BID   hydrocerin   Topical BID   insulin aspart  0-15 Units Subcutaneous Q4H   insulin aspart  6 Units Subcutaneous Q4H   insulin detemir  25 Units Subcutaneous BID   mouth rinse  15 mL Mouth Rinse QID   polyethylene glycol  17 g Per Tube Daily   QUEtiapine  25 mg Per Tube BID   Continuous Infusions:  sodium chloride Stopped (  12/20/20 0955)   dexmedetomidine (PRECEDEX) IV infusion 0.8 mcg/kg/hr (12/21/20 0600)   feeding supplement (VITAL 1.5 CAL) 1,000 mL (12/20/20 2200)   heparin 950 Units/hr (12/21/20 0600)   levETIRAcetam 500 mg (12/21/20 0923)   thiamine injection Stopped (12/20/20 2145)   PRN Meds:.sodium chloride, acetaminophen, dextrose, docusate, fentaNYL (SUBLIMAZE) injection, fentaNYL (SUBLIMAZE) injection, sodium chloride flush    Interim History / Subjective:  On  precedex this morning, failed SBT for apnea.   Objective   Blood pressure 114/70, pulse 89, temperature 98.1 F (36.7 C), temperature source Axillary, resp. rate (!) 28, height _0  (1.727 m), weight 107.4 kg, SpO2 100 %.    Vent Mode: PSV;CPAP FiO2 (%):  [35 %-40 %] 35 % Set Rate:  [14 bmp-20 bmp] 14 bmp Vt Set:  [550 mL] 550 mL PEEP:  [5 cmH20] 5 cmH20 Pressure Support:  [10 cmH20] 10 cmH20 Plateau Pressure:  [17 cmH20-18 cmH20] 18 cmH20   Intake/Output Summary (Last 24 hours) at 12/21/2020 1009 Last data filed at 12/21/2020 0929 Gross per 24 hour  Intake 2411.8 ml  Output 1135 ml  Net 1276.8 ml   Filed Weights   12/19/20 1054 12/20/20 0400 12/21/20 0431  Weight: 107.1 kg 107.3 kg 107.4 kg   Chronically ill appearing Trach to vent Breathing non labored Moves all 4 extremities but does not follow commands Brown non bloody colostomy output RRR, no mrg  Labs personally reviewed Na 137 K 4.2 Glucose 335 Cr 4.61 Wbc 11.7 Hgb 7.3   Resolved Hospital Problem list   Shock  HHS Afib RVR  Lactic Acidosis  Thrombocytopenia colitis  Assessment & Plan:   Altered mental status Metabolic encephalopathy History of CVA in 2015 History of seizure disorder - Continue with Keppra. reducing AED dosing for renal function. - Continue to wean sedation as tolerated. PAD protocol  - reducing AED dosing for renal function - continue precedex for goal RASS 0 to -1. Will stop zyprexa, start seroquel BID. He did well on SBT while he was off precedex.   Acute hypoxemic respiratory failure Aspiration pneumonia Patient had failed extubation 8/8 secondary to increased secretions -> low O2 sats. Tracheostomy performed 8/11 due to excessive secretions, difficult vent wean. Completed course of antibiotics on 8/7 - last chest xray (8/8) without focal lung disease. - Continue pulmonary hygiene - TC trials as tolerated. Did 6 hours on Saturday, will keep trying today.   AKI on chronic  kidney disease stage IIIb, now HD dependent Management per nephrology.  Last HD on 8/12 - will need tunnled catheter this week, currently with temporary trialysis catheter.  - if he gets hypotensive again with dialysis prefer midodrine rather than pressors.   Stage IIIb transverse colon cancer S/p partial transverse colectomy, end colectomy On CAPOX -Has had 3 cycles 6/1, 6/22, 7/13 -He follows with Dr. Ammie Dalton  Chronic heart failure with preserved ejection fraction -Last ejection fraction of 60 to 65% on last echo  Hypertension -stopped home anti-hypertensives due to hypotension, - midodrine stopped per nephrology - was on clonidine at some point as well, this has also been stop. Will monitor off all agents.   Hyperlipidemia Continue home lipitor  Type 2 diabetes -Continue SSI, increased tube feed coverage and levemir today given uncontrolled CBGs. - maintain goal CBG 140-180  Capecitabine induced desquamation Hand/foot syndrome -Continue moisturizer to hands and feet and scrotum   Best Practice  Diet/type: tf per nutrition. NPO at midnight for Tunneled HD catheter placement 8/15.  DVT prophylaxis: SCD/ heparin  GI prophylaxis: H2B Lines: Port, femoral trialysis Foley:  N/A Code Status:  full code Last date of goals of care discussion : 8/2 : PCCM Palli, RN, family -- full code full scope   The patient is critically ill due to respiratory failure, encephalopathy.  Critical care was necessary to treat or prevent imminent or life-threatening deterioration.  Critical care was time spent personally by me on the following activities: development of treatment plan with patient and/or surrogate as well as nursing, discussions with consultants, evaluation of patient's response to treatment, examination of patient, obtaining history from patient or surrogate, ordering and performing treatments and interventions, ordering and review of laboratory studies, ordering and review of  radiographic studies, pulse oximetry, re-evaluation of patient's condition and participation in multidisciplinary rounds.   Critical Care Time devoted to patient care services described in this note is 34 minutes. This time reflects time of care of this [place] . This critical care time does not reflect separately billable procedures or procedure time, teaching time or supervisory time of PA/NP/Med student/Med Resident etc but could involve care discussion time.       Spero Geralds [facility] 12/21/2020 10:09 AM  Pager: see AMION  If no response to pager , please call critical care on call (see AMION) until 7pm After 7:00 pm call Elink

## 2020-12-21 NOTE — Progress Notes (Signed)
PT Cancellation Note  Patient Details Name: DESMEN SCHOFFSTALL MRN: [id number] DOB: 1956-01-05   Cancelled Treatment:    Reason Eval/Treat Not Completed: Patient not medically ready;Patient's level of consciousness  Noted unsuccessful vent weaning trials yesterday and back on full support  Cancelled Treatment:    Reason Eval/Treat Not Completed: Medical issues which prohibited therapy Orders received, chart reviewed; Noted temporary  femoral HD catheter still in place; We typically hold mobilizing with a non-tunneled femoral HD catheter due to risk of losing line; Will hold PT today, and be happy to proceed with PT eval once temporary femoral HD catheter is out;  Thank you,     Colletta [place] 12/21/2020, 8:00 AM

## 2020-12-21 NOTE — Progress Notes (Signed)
OT Cancellation Note  Patient Details Name: Thomas [name] MRN: [id number] DOB: 28-May-1955   Cancelled Treatment:    Reason Eval/Treat Not Completed: Medical issues which prohibited therapy;Patient's level of consciousness Pt back on full vent support and noted with femoral line still in place. Will follow-up tomorrow for OT eval as appropriate.  Layla Maw 12/21/2020, 11:10 AM

## 2020-12-21 NOTE — Consult Note (Signed)
Chief Complaint: Patient was seen in consultation today for  Chief Complaint  Patient presents with   Abdominal Pain    Referring Physician(s): Dr. Royce Macadamia - Nephrology  Supervising Physician: Corrie Mckusick  Patient Status: [facility] - In-pt  History of Present Illness: Thomas [name] is a 65 y.o. male with a medical history significant for DM, left MCA stroke, brain tumor, epilepsy and Stage IIIb colon cancer s/p colectomy with colostomy creation. currently undergoing chemotherapy.  He presented to the [facility] ED 11/30/20 with complaints of abdominal pain, anorexia and weakness. His lactic acid was 6.8 and he was admitted for sepsis. His hospital course has been complicated by Afib with RVR, delirium, pneumonia and multiple organ system failure. He was intubated and started on CRRT for AKI on 12/07/20. A femoral non-tunneled catheter was placed for dialysis access. Patient was able to transition from CRRT to HD on 12/19/20.   Interventional Radiology has been asked to evaluate this patient for an image-guided tunneled dialysis catheter to facilitate his long-term hemodialysis plans.    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   Dizziness    Dyspnea    pt states d/t weight   Epilepsy ([place])    takes Keppra daily   GERD (gastroesophageal reflux disease)    takes Omeprazole daily   Headache    Hyperlipidemia    takes Atorvastatin daily   Hypertension    takes Lotrel daily   Peripheral edema    takes Lasix daily   Peripheral neuropathy    takes Gabapentin daily   Pneumonia 3 yrs ago   hx of   Seizures ([place])    Sleep apnea    Urinary frequency     Past Surgical History:   Procedure Laterality Date   BIOPSY  09/05/2020   Procedure: BIOPSY;  Surgeon: Irene Shipper, MD;  Location: [facility] ENDOSCOPY;  Service: Endoscopy;;   CARDIAC CATHETERIZATION  7 yrs ago   COLONOSCOPY WITH PROPOFOL N/A 09/05/2020   Procedure: COLONOSCOPY WITH PROPOFOL;  Surgeon: Irene Shipper, MD;  Location: [facility] ENDOSCOPY;  Service: Endoscopy;  Laterality: N/A;   CRANIOTOMY N/A 09/01/2016   Procedure: CRANIOTOMY TUMOR  LEFT PTERIONAL;  Surgeon: Ashok Pall, MD;  Location: [place];  Service: Neurosurgery;  Laterality: N/A;   cyst removed from chest      as a child   NO PAST SURGERIES     PARTIAL COLECTOMY N/A 09/10/2020   Procedure: TRANSVERSE COLECTOMY;  Surgeon: [name], MD;  Location: [place];  Service: General;  Laterality: N/A;   POLYPECTOMY  09/05/2020   Procedure: POLYPECTOMY;  Surgeon: Irene Shipper, MD;  Location: [facility] ENDOSCOPY;  Service: Endoscopy;;   PORTACATH PLACEMENT Right 10/01/2020   Procedure: INSERTION PORT-A-CATH;  Surgeon: [name], MD;  Location: [place];  Service: General;  Laterality: Right;   SUBMUCOSAL TATTOO INJECTION  09/05/2020   Procedure: SUBMUCOSAL TATTOO INJECTION;  Surgeon: Irene Shipper, MD;  Location: [facility] ENDOSCOPY;  Service: Endoscopy;;    Allergies: Patient has no known allergies.  Medications: Prior to Admission medications   Medication Sig Start Date End  Date Taking? Authorizing Provider  acetaminophen (TYLENOL) 500 MG tablet Take 1,000 mg by mouth 3 (three) times daily as needed for headache (pain).   Yes [provider]  albuterol (PROVENTIL HFA;VENTOLIN HFA) 108 (90 BASE) MCG/ACT inhaler Inhale 2 puffs into the lungs every 4 (four) hours as needed for wheezing or shortness of breath. 04/09/15  Yes Muthersbaugh, Jarrett Soho, PA-C  amLODipine (NORVASC) 10 MG tablet Take 10 mg by mouth daily. 05/11/20  Yes [provider]  capecitabine (XELODA) 500 MG tablet Take 3 tablets (1,500 mg total) by mouth 2 (two) times daily after a meal. 11/19/20   Yes Ladell Pier, MD  gabapentin (NEURONTIN) 300 MG capsule Take 1 capsule (300 mg total) by mouth 3 (three) times daily. 04/06/16  Yes Varney Biles, MD  levETIRAcetam (KEPPRA) 1000 MG tablet Take 1,000 mg by mouth 2 (two) times daily. 07/28/16  Yes [provider]  metFORMIN (GLUCOPHAGE-XR) 500 MG 24 hr tablet Take 500 mg by mouth 2 (two) times daily. 07/27/20  Yes [provider]  omeprazole (PRILOSEC) 20 MG capsule Take 20 mg by mouth daily. 08/10/16  Yes [provider]  atorvastatin (LIPITOR) 20 MG tablet Take 20 mg by mouth daily at 6 PM.    [provider]  lidocaine-prilocaine (EMLA) cream Apply 1 application topically as directed. Apply 1/2 tablespoon to port site 2 hours prior to stick and cover with plastic wrap to numb site Patient not taking: Reported on 10/08/2020 09/24/20   Ladell Pier, MD  ondansetron (ZOFRAN) 8 MG tablet Take 1 tablet (8 mg total) by mouth every 8 (eight) hours as needed for nausea or vomiting. Patient not taking: No sig reported 09/24/20   Ladell Pier, MD  prochlorperazine (COMPAZINE) 10 MG tablet Take 1 tablet (10 mg total) by mouth every 6 (six) hours as needed. Patient not taking: No sig reported 09/24/20   Ladell Pier, MD     Family History  Problem Relation Age of Onset   Breast cancer Mother    Breast cancer Maternal Aunt    Prostate cancer Maternal Uncle     Social History   Socioeconomic History   Marital status: Married    Spouse name: Mahalia   Number of children: 4   Years of education: Not on file   Highest education level: Not on file  Occupational History   Occupation: Disabled  Tobacco Use   Smoking status: Former   Smokeless tobacco: Never   Tobacco comments:    quit smoking 35 yrs ago  Vaping Use   Vaping Use: Never used  Substance and Sexual Activity   Alcohol use: No   Drug use: No   Sexual activity: Not on file  Other Topics Concern   Not on file  Social History  Narrative   Married to his wife, Jerlyn Ly with total of #4 children (#2 with current wife). Lives in home with his son and daughter-in-law.     Social Determinants of Health   Financial Resource Strain: Not on file  Food Insecurity: Not on file  Transportation Needs: Not on file  Physical Activity: Not on file  Stress: Not on file  Social Connections: Not on file    Review of Systems: A 12 point ROS discussed and pertinent positives are indicated in the HPI above.  All other systems are negative.  Review of Systems  Unable to perform ROS: Patient nonverbal   Vital Signs: BP 114/70   Pulse 89   Temp  98.1 F (36.7 C) (Axillary)   Resp (!) 28   Ht 5\' 8"  (1.727 m)   Wt 236 lb 12.4 oz (107.4 kg)   SpO2 100%   BMI 36.00 kg/m   Physical Exam Constitutional:      General: He is not in acute distress.    Appearance: He is ill-appearing.  HENT:     Mouth/Throat:     Mouth: Mucous membranes are dry.     Pharynx: Oropharynx is clear.  Cardiovascular:     Rate and Rhythm: Normal rate and regular rhythm.     Pulses: Normal pulses.     Heart sounds: Normal heart sounds.     Comments: Left femoral trialysis catheter Pulmonary:     Effort: Pulmonary effort is normal.     Breath sounds: Normal breath sounds.     Comments: Tracheostomy/trach collar  Abdominal:     General: Bowel sounds are normal.     Palpations: Abdomen is soft.     Comments: Colostomy.   Skin:    General: Skin is warm and dry.  Neurological:     Mental Status: He is alert.     Comments: Unable to determine    Imaging: CT ABDOMEN PELVIS WO CONTRAST  Result Date: 12/08/2020 CLINICAL DATA:  Abdominal distension, decreasing colostomy output, history of colon cancer status post transverse colectomy EXAM: CT ABDOMEN AND PELVIS WITHOUT CONTRAST TECHNIQUE: Multidetector CT imaging of the abdomen and pelvis was performed following the standard protocol without IV contrast. Oral enteric contrast was administered  COMPARISON:  CT chest abdomen pelvis, 12/01/2020 FINDINGS: Lower chest: There is new, dense heterogeneous and consolidative airspace opacity of the right lower and right middle lobes (series 7, image 1). Coronary artery calcifications. Aortic valve calcifications Hepatobiliary: No solid liver abnormality is seen. Hepatic steatosis. No gallstones, gallbladder wall thickening, or biliary dilatation. Pancreas: Unremarkable. No pancreatic ductal dilatation or surrounding inflammatory changes. Spleen: Normal in size without significant abnormality. Adrenals/Urinary Tract: Adrenal glands are unremarkable. Kidneys are normal, without renal calculi, solid lesion, or hydronephrosis. Bladder is unremarkable. Stomach/Bowel: Esophagogastric tube is position with tip and side port below the diaphragm, however folded in the gastric body, with tip in the gastric fundus (series 2, image 17). 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 The proximal colon as well as the excluded colonic remnant are fluid-filled, and there is inflammatory wall thickening and fat stranding about the descending and sigmoid colon (series 2, image 78). Vascular/Lymphatic: Left femoral venous and arterial catheters. Aortic atherosclerosis. Incidental note of retroaortic left renal vein. No enlarged abdominal or pelvic lymph nodes. Reproductive: No mass or other significant abnormality. Other: Mild anasarca. Fat containing left inguinal hernia. No abdominopelvic ascites. Musculoskeletal: No acute or significant osseous findings. IMPRESSION: 1. 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 2. The proximal colon as well as the excluded colonic remnant are fluid-filled, and there is inflammatory wall thickening and fat stranding about the descending and sigmoid colon. Findings are new compared to prior CT dated 12/01/2020, consistent with nonspecific infectious, inflammatory, or ischemic colitis. 3. There is  new, dense heterogeneous and consolidative airspace opacity of the right lower and right middle lobes, consistent with infection or aspiration. 4. Esophagogastric tube is positioned with tip and side port below the diaphragm, however folded in the gastric body, with tip in the gastric fundus. Recommend repositioning. 5. Hepatic steatosis. 6. Coronary artery disease. Aortic Atherosclerosis (ICD10-I70.0). Electronically Signed   By: Eddie Candle M.D.   On:  12/08/2020 17:45   DG Chest 1 View  Result Date: 12/15/2020 CLINICAL DATA:  Intubation. EXAM: CHEST  1 VIEW COMPARISON:  12/15/2020 and CT chest 12/01/2020. FINDINGS: Endotracheal tube terminates approximately 3.7 cm above the carina. Right IJ power port tip is in the low SVC. Heart size stable. Possible mild right infrahilar atelectasis. No pleural fluid. IMPRESSION: 1. Endotracheal tube terminates approximately 3.7 cm above the carina. 2. There may be mild right infrahilar atelectasis. Electronically Signed   By: Lorin Picket M.D.   On: 12/15/2020 13:20   DG Chest 1 View  Result Date: 12/06/2020 CLINICAL DATA:  Shortness of breath. Patient undergoing chemotherapy for stage IIIB colon cancer. EXAM: CHEST  1 VIEW COMPARISON:  December 04, 2020 FINDINGS: Platelike opacity in the left base consistent with atelectasis or scar, stable. New opacity in the right base obscuring the elevated right hemidiaphragm. The heart, hila, and mediastinum are unchanged and unremarkable. The right Port-A-Cath is stable. No pulmonary nodules or masses. No other abnormalities. IMPRESSION: 1. Stable platelike opacity in the left base consistent with scar or atelectasis. 2. New opacity in the right base obscuring the elevated right hemidiaphragm. The opacity could represent atelectasis versus developing infiltrate. Recommend clinical correlation and short-term follow-up imaging to ensure resolution. Electronically Signed   By: Dorise Bullion III M.D   On: 12/06/2020 11:50   DG Abd  1 View  Result Date: 12/15/2020 CLINICAL DATA:  OG tube placement EXAM: ABDOMEN - 1 VIEW COMPARISON:  12/06/2020 FINDINGS: Enteric tube tip is overlying the distal stomach. Bowel gas pattern is not well evaluated. IMPRESSION: Enteric tube within the distal stomach. Electronically Signed   By: Macy Mis M.D.   On: 12/15/2020 13:35   DG Abd 1 View  Result Date: 12/06/2020 CLINICAL DATA:  NG tube placement. EXAM: ABDOMEN - 1 VIEW COMPARISON:  CT 12/01/2020 FINDINGS: Tip and side port of the enteric tube below the diaphragm in the stomach. Generalized paucity of bowel gas in the upper abdomen. IMPRESSION: Tip and side port of the enteric tube below the diaphragm in the stomach. Electronically Signed   By: Keith Rake M.D.   On: 12/06/2020 23:37   CT HEAD WO CONTRAST  Result Date: 12/05/2020 CLINICAL DATA:  Acute neuro deficit, agitation and delirium, history of colon carcinoma EXAM: CT HEAD WITHOUT CONTRAST TECHNIQUE: Contiguous axial images were obtained from the base of the skull through the vertex without intravenous contrast. COMPARISON:  09/08/2016 and previous FINDINGS: Brain: Previous left frontal craniotomy. Stable encephalomalacia in left frontal lobe. Negative for acute intracranial hemorrhage, midline shift, no parenchymal edema, mass or mass effect. No hydrocephalus. Mild diffuse parenchymal atrophy as before. Vascular: Atherosclerotic and physiologic intracranial calcifications. Skull: Changes of left temporal craniotomy. No acute fracture or worrisome bone lesion. Sinuses/Orbits: No acute finding. Other: None IMPRESSION: 1. Negative for bleed or other acute intracranial process. 2. Stable changes of left frontal craniotomy Electronically Signed   By: Lucrezia Europe M.D.   On: 12/05/2020 11:34   US RENAL  Result Date: 12/06/2020 CLINICAL DATA:  AK I EXAM: RENAL / URINARY TRACT ULTRASOUND COMPLETE COMPARISON:  Renal ultrasound 10/02/2020 FINDINGS: Right Kidney: Renal measurements: 9.8 x  6.3 x 5.9 cm = volume: 190 mL. Echogenicity within normal limits. No mass or hydronephrosis visualized. Left Kidney: Renal measurements: 11.9 x 7.2 x 5.8 cm = volume: 262 mL. Echogenicity within normal limits. No hydronephrosis. There is a cyst in the lateral aspect of the kidney measuring 4.5 x 4.0 x 4.0 cm.  Bladder: Appears decompressed with Foley in place. Other: None. IMPRESSION: No acute abnormality sonographically in the kidneys. Exam is limited by difficulty with patient positioning and shadowing bowel gas. Electronically Signed   By: Audie Pinto M.D.   On: 12/06/2020 11:47   DG Chest Port 1 View  Result Date: 12/18/2020 CLINICAL DATA:  Status post tracheostomy tube placement. EXAM: PORTABLE CHEST 1 VIEW COMPARISON:  12/15/2020 FINDINGS: 1415 hours. Tracheostomy tube tip is 5.5 cm above the base of the carina. Low volume film with basilar atelectasis, right greater than left. No pneumothorax or substantial pleural effusion. Right Port-A-Cath remains in place. Telemetry leads overlie the chest. IMPRESSION: Tracheostomy tube tip is 5.5 cm above the base of the carina. Electronically Signed   By: Misty Stanley M.D.   On: 12/18/2020 14:49   DG Chest Port 1 View  Result Date: 12/15/2020 CLINICAL DATA:  Acute respiratory failure with hypoxemia. EXAM: PORTABLE CHEST 1 VIEW COMPARISON:  12/12/2020 FINDINGS: Endotracheal tube is 4.2 cm above the carina. Right jugular central line tip is at the superior cavoatrial junction. No focal lung disease. Negative for a pneumothorax. Heart size is normal. Nasogastric tube extends into the abdomen and coiled in the proximal stomach region. IMPRESSION: 1. Endotracheal tube is appropriately positioned. 2. No focal lung disease. Electronically Signed   By: Markus Daft M.D.   On: 12/15/2020 07:33   DG CHEST PORT 1 VIEW  Result Date: 12/12/2020 CLINICAL DATA:  Endotracheal tube position EXAM: PORTABLE CHEST 1 VIEW COMPARISON:  Chest radiograph 12/09/2020 FINDINGS: The  endotracheal tube tip is in the midthoracic trachea. A right chest wall port is unchanged. The enteric catheter tip is in the stomach. The cardiomediastinal silhouette is stable. There is no focal consolidation or pulmonary edema. There is no pleural effusion or pneumothorax. The bones are stable. IMPRESSION: Endotracheal tube tip in the midthoracic trachea. Electronically Signed   By: Valetta Mole MD   On: 12/12/2020 08:09   DG CHEST PORT 1 VIEW  Result Date: 12/09/2020 CLINICAL DATA:  65 year old male, evaluate for pneumonia. EXAM: PORTABLE CHEST - 1 VIEW COMPARISON:  12/07/2020 FINDINGS: The patient is rotated to the right, limiting of diagnostic evaluation. Unchanged appearance of right chest wall, internal jugular vein approach Port-A-Cath with the catheter tip in the superior right atrium. Endotracheal tube in place with the tip in the inferior thoracic trachea, just above the carina. Gastric decompression tube courses off the inferior aspect of this image, the tip is visualized in the fundus. The mediastinal contours are obscured, though similar to comparison limits. No cardiomegaly. Hazy opacities in the right lung base, limited evaluation due to projection. No acute osseous abnormality. IMPRESSION: 1. Right lower lobe subsegmental atelectasis versus infiltrate. Limited evaluation due to patient rotation. Recommend attention on follow-up. 2. Support lines and tubes in unchanged, appropriate positions. Electronically Signed   By: Ruthann Cancer MD   On: 12/09/2020 11:15   DG Chest Port 1 View  Result Date: 12/07/2020 CLINICAL DATA:  Oxygen desaturation. EXAM: PORTABLE CHEST 1 VIEW COMPARISON:  Radiograph yesterday.  CT 12/01/2020 FINDINGS: Endotracheal tube tip 2 cm from the carina. Enteric tube tip below the diaphragm not included in the field of view. Right chest port remains in place. Improved aeration of the right lung base with decreased volume loss. Residual patchy basilar opacity. The heart is  normal in size. No pulmonary edema, large pleural effusion or pneumothorax. IMPRESSION: 1. Improved aeration of the right lung base with decreased volume loss since radiograph  yesterday. Residual patchy right basilar opacity. 2. Stable support apparatus. Electronically Signed   By: Keith Rake M.D.   On: 12/07/2020 22:12   DG CHEST PORT 1 VIEW  Result Date: 12/06/2020 CLINICAL DATA:  Intubation. EXAM: PORTABLE CHEST 1 VIEW COMPARISON:  Radiograph earlier today.  CT 12/01/2020 FINDINGS: Endotracheal tube tip 19 mm from the carina. Enteric tube in place with tip and side-port below the diaphragm. Right chest port unchanged in position. There is progressive volume loss in the right hemithorax with opacification of the lower thorax and rightward mediastinal shift. Improved linear opacity at the left lung base. No pneumothorax. IMPRESSION: 1. Endotracheal tube tip 19 mm from the carina. Enteric tube in place. 2. Progressive volume loss in the right hemithorax with opacification of the lower thorax and rightward mediastinal shift. Findings favor lobar collapse/atelectasis. Linear opacity at the left lung base is improved from earlier today. Electronically Signed   By: Keith Rake M.D.   On: 12/06/2020 23:40   DG CHEST PORT 1 VIEW  Result Date: 12/04/2020 CLINICAL DATA:  Vomiting with possible aspiration. EXAM: PORTABLE CHEST 1 VIEW COMPARISON:  November 30, 2020 FINDINGS: There is stable right-sided venous Port-A-Cath positioning. Mild linear atelectasis is seen within the mid left lung. This represents a new finding when compared to the prior exam. There is no evidence of a pleural effusion or pneumothorax. The heart size and mediastinal contours are within normal limits. The visualized skeletal structures are unremarkable. IMPRESSION: Interval development of mild linear atelectasis within the mid left lung since the prior study. Electronically Signed   By: Virgina Norfolk M.D.   On: 12/04/2020 01:19    DG Chest Port 1 View  Result Date: 11/30/2020 CLINICAL DATA:  Questionable sepsis. Patient began treatment for colon cancer 3 months ago. Loss of appetite, severe abdominal pain, weakness, and back pain. EXAM: PORTABLE CHEST 1 VIEW COMPARISON:  11/27/2020 FINDINGS: Heart size and pulmonary vascularity are normal. Lungs clear. Power port type central venous catheter with tip over the low SVC region. No pneumothorax. No pleural effusions. Mediastinal contours appear intact. IMPRESSION: No active disease. Electronically Signed   By: Lucienne Capers M.D.   On: 11/30/2020 22:36   DG Chest Port 1 View  Result Date: 11/27/2020 CLINICAL DATA:  Weakness.  Fall today. EXAM: PORTABLE CHEST 1 VIEW COMPARISON:  Radiograph 11/15/2020 FINDINGS: Right chest port unchanged in position. Normal heart size and mediastinal contours. No acute or focal airspace disease no pleural fluid, pulmonary edema, or pneumothorax. No acute osseous abnormalities are seen. IMPRESSION: No acute chest findings. Electronically Signed   By: Keith Rake M.D.   On: 11/27/2020 22:09   DG Abd Portable 1V  Result Date: 12/19/2020 CLINICAL DATA:  Feeding tube placement. EXAM: PORTABLE ABDOMEN - 1 VIEW COMPARISON:  Radiograph 12/15/2020 FINDINGS: New weighted enteric tube in place. The tip is in the right upper quadrant in the region of the distal stomach or proximal duodenum. Generalized paucity of bowel gas in the included upper abdomen. IMPRESSION: New weighted enteric tube with tip in the right upper quadrant in the region of the distal stomach or proximal duodenum. Electronically Signed   By: Keith Rake M.D.   On: 12/19/2020 16:02   EEG adult  Result Date: 12/05/2020 Lora Havens, MD     12/05/2020  6:19 PM Patient Name: ZENAS SANTA MRN: [id number] Epilepsy Attending: Lora Havens Referring Physician/Provider: Dr Sander Radon Date: 12/05/2020 Duration: 23.18 mins Patient history: 65yo M withh/o epilepsy presented  with  ams. EEG to evaluate for seizure Level of alertness: Awake AEDs during EEG study: LEV Technical aspects: This EEG study was done with scalp electrodes positioned according to the 10-20 International system of electrode placement. Electrical activity was acquired at a sampling rate of 500Hz  and reviewed with a high frequency filter of 70Hz  and a low frequency filter of 1Hz . EEG data were recorded continuously and digitally stored. Description: No posterior dominant rhythm was seen. EEG showed continuous generalized 3 to 6 Hz theta-delta slowing. There is also 3-5hz  sharply contoured theta-delta slowing with overriding 13-15hz  beta activity in left frontotemporal region consistent with breach artifact.  Hyperventilation and photic stimulation were not performed.   ABNORMALITY - Continuous slow, generalized - Breach artifact, left frontotemporal region IMPRESSION: This study is suggestive of cortical dysfunction arising from left frontotemporal region likely secondary to underlying encephalomalacia as well as prior craniotomy. There is also moderate diffuse encephalopathy, nonspecific etiology. No seizures or definite epileptiform discharges were seen throughout the recording. Lora Havens   ECHOCARDIOGRAM COMPLETE  Result Date: 12/02/2020    ECHOCARDIOGRAM REPORT   Patient Name:   EDWEN MCLESTER Date of Exam: 12/02/2020 Medical Rec #:  [id number]    Height:       68.0 in Accession #:    [id number]   Weight:       246.5 lb Date of Birth:  [birth year]    BSA:          2.234 m Patient Age:    42 years     BP:           136/77 mmHg Patient Gender: M            HR:           92 bpm. Exam Location:  Inpatient Procedure: 2D Echo, Color Doppler and Cardiac Doppler Indications:    I48.91* Unspecified atrial fibrillation  History:        Patient has no prior history of Echocardiogram examinations.                 Arrythmias:Atrial Fibrillation; Risk Factors:Hypertension,                 Diabetes, Dyslipidemia and Sleep  Apnea.  Sonographer:    Raquel Sarna Senior RDCS Referring Phys: [phone] [facility]  Sonographer Comments: Technically difficult due to patient body habitus. IMPRESSIONS  1. Mild intracavitary gradient. Peak velocity 1.37 m/s. Peak gradient 7.5 mmHg. Left ventricular ejection fraction, by estimation, is 60 to 65%. The left ventricle has normal function. The left ventricle has no regional wall motion abnormalities. There is moderate concentric left ventricular hypertrophy. Left ventricular diastolic parameters are consistent with Grade I diastolic dysfunction (impaired relaxation).  2. Right ventricular systolic function is normal. The right ventricular size is normal. There is normal pulmonary artery systolic pressure.  3. The mitral valve is normal in structure. No evidence of mitral valve regurgitation. No evidence of mitral stenosis.  4. The aortic valve is calcified. There is moderate calcification of the aortic valve. Aortic valve regurgitation is not visualized. No aortic stenosis is present.  5. The inferior vena cava is normal in size with greater than 50% respiratory variability, suggesting right atrial pressure of 3 mmHg. FINDINGS  Left Ventricle: Mild intracavitary gradient. Peak velocity 1.37 m/s. Peak gradient 7.5 mmHg. Left ventricular ejection fraction, by estimation, is 60 to 65%. The left ventricle has normal function. The left ventricle has no regional wall motion abnormalities. The left ventricular  internal cavity size was normal in size. There is moderate concentric left ventricular hypertrophy. Left ventricular diastolic parameters are consistent with Grade I diastolic dysfunction (impaired relaxation). Indeterminate filling pressures. Right Ventricle: The right ventricular size is normal. No increase in right ventricular wall thickness. Right ventricular systolic function is normal. There is normal pulmonary artery systolic pressure. The tricuspid regurgitant velocity is 1.72 m/s, and  with an  assumed right atrial pressure of 8 mmHg, the estimated right ventricular systolic pressure is 24.2 mmHg. Left Atrium: Left atrial size was normal in size. Right Atrium: Right atrial size was normal in size. Pericardium: There is no evidence of pericardial effusion. Mitral Valve: The mitral valve is normal in structure. No evidence of mitral valve regurgitation. No evidence of mitral valve stenosis. Tricuspid Valve: The tricuspid valve is normal in structure. Tricuspid valve regurgitation is trivial. No evidence of tricuspid stenosis. Aortic Valve: The aortic valve is calcified. There is moderate calcification of the aortic valve. Aortic valve regurgitation is not visualized. No aortic stenosis is present. Pulmonic Valve: The pulmonic valve was normal in structure. Pulmonic valve regurgitation is not visualized. No evidence of pulmonic stenosis. Aorta: The aortic root is normal in size and structure. Venous: The inferior vena cava is normal in size with greater than 50% respiratory variability, suggesting right atrial pressure of 3 mmHg. IAS/Shunts: No atrial level shunt detected by color flow Doppler.  LEFT VENTRICLE PLAX 2D LVIDd:         3.20 cm  Diastology LVIDs:         2.10 cm  LV e' medial:    4.68 cm/s LV PW:         1.30 cm  LV E/e' medial:  11.5 LV IVS:        1.50 cm  LV e' lateral:   6.85 cm/s LVOT diam:     2.00 cm  LV E/e' lateral: 7.9 LV SV:         43 LV SV Index:   19 LVOT Area:     3.14 cm  RIGHT VENTRICLE RV S prime:     13.50 cm/s TAPSE (M-mode): 1.6 cm LEFT ATRIUM             Index       RIGHT ATRIUM           Index LA diam:        3.20 cm 1.43 cm/m  RA Area:     12.50 cm LA Vol (A2C):   46.5 ml 20.82 ml/m RA Volume:   26.60 ml  11.91 ml/m LA Vol (A4C):   33.6 ml 15.04 ml/m LA Biplane Vol: 41.5 ml 18.58 ml/m  AORTIC VALVE LVOT Vmax:   98.65 cm/s LVOT Vmean:  79.600 cm/s LVOT VTI:    0.138 m  AORTA Ao Root diam: 3.10 cm MITRAL VALVE                TRICUSPID VALVE MV Area (PHT): 3.10 cm      TR Peak grad:   11.8 mmHg MV Decel Time: 245 msec     TR Vmax:        172.00 cm/s MV E velocity: 54.00 cm/s MV A velocity: 102.00 cm/s  SHUNTS MV E/A ratio:  0.53         Systemic VTI:  0.14 m  Systemic Diam: 2.00 cm Skeet Latch MD Electronically signed by Skeet Latch MD Signature Date/Time: 12/02/2020/5:47:16 PM    Final    VAS [place] LOWER EXTREMITY VENOUS (DVT)  Result Date: 12/12/2020  Lower Venous DVT Study Patient Name:  JOSE CORVIN  Date of Exam:   12/12/2020 Medical Rec #: [id number]     Accession #:    [id number] Date of Birth: 1955-11-03     Patient Gender: M Patient Age:   52 years Exam Location:  [facility] Procedure:      VAS [place] LOWER EXTREMITY VENOUS (DVT) Referring Phys: MURALI RAMASWAMY --------------------------------------------------------------------------------  Indications: Edema.  Limitations: Body habitus, poor ultrasound/tissue interface and continuous HD to left groin. Comparison Study: Previous exam 1.5.2013 negative for DVT Performing Technologist: Jody Hill RVT, RDMS  Examination Guidelines: A complete evaluation includes B-mode imaging, spectral Doppler, color Doppler, and power Doppler as needed of all accessible portions of each vessel. Bilateral testing is considered an integral part of a complete examination. Limited examinations for reoccurring indications may be performed as noted. The reflux portion of the exam is performed with the patient in reverse Trendelenburg.  +---------+---------------+---------+-----------+----------+--------------+ RIGHT    CompressibilityPhasicitySpontaneityPropertiesThrombus Aging +---------+---------------+---------+-----------+----------+--------------+ CFV      Partial        Yes      Yes                  Acute          +---------+---------------+---------+-----------+----------+--------------+ SFJ      Partial                                      Acute           +---------+---------------+---------+-----------+----------+--------------+ FV Prox  None           No       No                   Acute          +---------+---------------+---------+-----------+----------+--------------+ FV Mid   Full           Yes      Yes                                 +---------+---------------+---------+-----------+----------+--------------+ FV DistalFull           Yes      Yes                                 +---------+---------------+---------+-----------+----------+--------------+ PFV      None           No       No                   Acute          +---------+---------------+---------+-----------+----------+--------------+ POP      None           No       No                   Acute          +---------+---------------+---------+-----------+----------+--------------+ PTV  Not visualized +---------+---------------+---------+-----------+----------+--------------+ PERO                                                  Not visualized +---------+---------------+---------+-----------+----------+--------------+   Right Technical Findings: Not visualized segments include peroneal and posterior tibial veins.  +---------+---------------+---------+-----------+----------+--------------+ LEFT     CompressibilityPhasicitySpontaneityPropertiesThrombus Aging +---------+---------------+---------+-----------+----------+--------------+ CFV      Full           Yes      Yes                                 +---------+---------------+---------+-----------+----------+--------------+ SFJ      Full                                                        +---------+---------------+---------+-----------+----------+--------------+ FV Prox  Full           Yes      Yes                                 +---------+---------------+---------+-----------+----------+--------------+ FV Mid   Full            Yes      Yes                                 +---------+---------------+---------+-----------+----------+--------------+ FV DistalFull           Yes      Yes                                 +---------+---------------+---------+-----------+----------+--------------+ PFV      Full                                                        +---------+---------------+---------+-----------+----------+--------------+ POP      Full           Yes      Yes                                 +---------+---------------+---------+-----------+----------+--------------+ PTV      Full                                                        +---------+---------------+---------+-----------+----------+--------------+ PERO                                                  Not visualized +---------+---------------+---------+-----------+----------+--------------+ CFV, SFJ,  PFV, and FV prox not well visualized due to HD placement. PopV difficult to image due to patient position.  Left Technical Findings: Not visualized segments include peroneal veins.   Summary: BILATERAL: - No evidence of superficial venous thrombosis in the lower extremities, bilaterally. -No evidence of popliteal cyst, bilaterally. RIGHT: - Findings consistent with acute deep vein thrombosis involving the right common femoral vein, SF junction, right femoral vein (prox), right proximal profunda vein, and right popliteal vein. - Portions of this examination were limited- see technologist comments above.  LEFT: - Portions of this examination were limited- see technologist comments above. - There is no evidence of deep vein thrombosis in the lower extremity.  *See table(s) above for measurements and observations. Electronically signed by Jamelle Haring on 12/12/2020 at 5:32:15 PM.    Final    CT CHEST ABDOMEN PELVIS WO CONTRAST  Result Date: 12/01/2020 CLINICAL DATA:  Abdominal pain and fever EXAM: CT CHEST, ABDOMEN AND PELVIS WITHOUT  CONTRAST TECHNIQUE: Multidetector CT imaging of the chest, abdomen and pelvis was performed following the standard protocol without IV contrast. COMPARISON:  09/04/2020 FINDINGS: CT CHEST FINDINGS Cardiovascular: Calcific atherosclerosis of the aorta and coronary arteries. No pericardial effusion. Course and caliber of the aorta are normal. Right chest wall Port-A-Cath tip is at the cavoatrial junction. Mediastinum/Nodes: No enlarged mediastinal, hilar, or axillary lymph nodes. Thyroid gland, trachea, and esophagus demonstrate no significant findings. Lungs/Pleura: Lungs are clear. No pleural effusion or pneumothorax. Musculoskeletal: No chest wall mass or suspicious bone lesions identified. CT ABDOMEN PELVIS FINDINGS Hepatobiliary: No focal liver abnormality is seen. No gallstones, gallbladder wall thickening, or biliary dilatation. Pancreas: Unremarkable. No pancreatic ductal dilatation or surrounding inflammatory changes. Spleen: Normal in size without focal abnormality. Adrenals/Urinary Tract: Adrenal glands are unremarkable. Kidneys are normal, without renal calculi, focal lesion, or hydronephrosis. Bladder is unremarkable. 3.5 cm left renal cyst. Stomach/Bowel: Normal appearance of the stomach and duodenum. No small bowel dilatation. 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 Status post partial colectomy. Vascular/Lymphatic: Aortic atherosclerosis. No enlarged abdominal or pelvic lymph nodes. Reproductive: Prostate is unremarkable. Other: Bilateral fat containing inguinal hernias. Musculoskeletal: No acute or significant osseous findings. IMPRESSION: 1. No acute abnormality of the chest, abdomen or pelvis. 2. Status post partial colectomy.  No evidence of obstruction. Aortic Atherosclerosis (ICD10-I70.0). Electronically Signed   By: Ulyses Jarred M.D.   On: 12/01/2020 02:20    Labs:  CBC: Recent Labs    12/18/20 0433 12/19/20 0354 12/20/20 0356 12/21/20 0427  WBC 15.7*  15.9* 15.1* 11.9* 11.7*   HGB 8.0*  8.3* 7.7* 8.0* 7.3*  HCT 25.8*  25.6* 23.4* 24.2* 22.9*  PLT 154  144* 149* 172 182    COAGS: Recent Labs    11/30/20 2205 12/08/20 0405 12/09/20 0916 12/10/20 0412 12/15/20 1100 12/15/20 1528 12/16/20 0417 12/16/20 1709  INR 1.5*  --  1.7*  --   --   --   --   --   APTT 22*   < >  --    < > 58* 63* 64* 151*   < > = values in this interval not displayed.    BMP: Recent Labs    12/18/20 0433 12/19/20 0354 12/20/20 0356 12/21/20 0427  NA 139 139 132* 137  K 4.7 4.4 3.5 4.2  CL 107 109 101 106  CO2 19* 16* 19* 20*  GLUCOSE 221* 104* 340* 335*  BUN 89* 106* 49* 58*  CALCIUM 7.6* 7.5* 7.1* 6.9*  CREATININE 3.85* 4.78*  3.33* 4.61*  GFRNONAA 17* 13* 20* 13*    LIVER FUNCTION TESTS: Recent Labs    11/30/20 2205 12/07/20 1744 12/08/20 1521 12/09/20 0416 12/09/20 0916 12/09/20 1555 12/18/20 0433 12/19/20 0354 12/20/20 0356 12/21/20 0427  BILITOT 1.4*  --  2.1*  --  2.0*  --   --   --   --  1.0  AST 26  --  43*  --  46*  --   --   --   --  16  ALT 29  --  23  --  23  --   --   --   --  22  ALKPHOS 67  --  61  --  74  --   --   --   --  72  PROT 6.8  --  5.4*  --  5.4*  --   --   --   --  5.6*  ALBUMIN 3.3*   < > 1.8*  1.8*   < > 1.7*   < > 1.7* 1.6* 1.7* 1.5*   < > = values in this interval not displayed.    TUMOR MARKERS: Recent Labs    10/08/20 0948  CEA <1.00    Assessment and Plan:  AKI on chronic kidney disease stage IIIb, now HD dependent: Thomas [name], 66 year old male, is tentatively scheduled for 12/22/20 for an image-guided tunneled dialysis catheter. He is NPO at midnight tonight. Telephone consent was obtained from the patient's wife and is in the IR APP office.  Risks and benefits discussed with the patient including, but not limited to bleeding, infection, vascular injury, pneumothorax which may require chest tube placement, air embolism or even death  All of the patient's questions were answered, patient is agreeable to  proceed. Patient will be NPO at midnight/hold tube feeds at midnight.   Thank you for this interesting consult.  I greatly enjoyed meeting Thomas [name] and look forward to participating in their care.  A copy of this report was sent to the requesting provider on this date.  Electronically Signed: Soyla Dryer, AGACNP-BC [phone] 12/21/2020, 10:35 AM   I spent a total of 20 Minutes    in face to face in clinical consultation, greater than 50% of which was counseling/coordinating care for tunneled dialysis catheter placement

## 2020-12-22 ENCOUNTER — Other Ambulatory Visit (HOSPITAL_COMMUNITY): Payer: Self-pay

## 2020-12-22 ENCOUNTER — Inpatient Hospital Stay (HOSPITAL_COMMUNITY): Payer: Medicare HMO

## 2020-12-22 DIAGNOSIS — N170 Acute kidney failure with tubular necrosis: Secondary | ICD-10-CM | POA: Diagnosis not present

## 2020-12-22 DIAGNOSIS — G9341 Metabolic encephalopathy: Secondary | ICD-10-CM | POA: Diagnosis not present

## 2020-12-22 DIAGNOSIS — N182 Chronic kidney disease, stage 2 (mild): Secondary | ICD-10-CM | POA: Diagnosis not present

## 2020-12-22 DIAGNOSIS — E1100 Type 2 diabetes mellitus with hyperosmolarity without nonketotic hyperglycemic-hyperosmolar coma (NKHHC): Secondary | ICD-10-CM | POA: Diagnosis not present

## 2020-12-22 HISTORY — PX: IR FLUORO GUIDE CV LINE LEFT: IMG2282

## 2020-12-22 HISTORY — PX: IR US GUIDE VASC ACCESS LEFT: IMG2389

## 2020-12-22 LAB — HEPARIN LEVEL (UNFRACTIONATED)
Heparin Unfractionated: 0.47 IU/mL (ref 0.30–0.70)
Heparin Unfractionated: 0.45 IU/mL (ref 0.30–0.70)

## 2020-12-22 LAB — RENAL FUNCTION PANEL
Albumin: 1.6 g/dL — ABNORMAL LOW (ref 3.5–5.0)
Anion gap: 10 (ref 5–15)
BUN: 70 mg/dL — ABNORMAL HIGH (ref 8–23)
CO2: 17 mmol/L — ABNORMAL LOW (ref 22–32)
Calcium: 7.1 mg/dL — ABNORMAL LOW (ref 8.9–10.3)
Chloride: 110 mmol/L (ref 98–111)
Creatinine, Ser: 5.43 mg/dL — ABNORMAL HIGH (ref 0.61–1.24)
GFR, Estimated: 11 mL/min — ABNORMAL LOW (ref >60)
Glucose, Bld: 220 mg/dL — ABNORMAL HIGH (ref 70–99)
Phosphorus: 4.9 mg/dL — ABNORMAL HIGH (ref 2.5–4.6)
Potassium: 4.5 mmol/L (ref 3.5–5.1)
Sodium: 137 mmol/L (ref 135–145)

## 2020-12-22 LAB — CBC
HCT: 22.5 % — ABNORMAL LOW (ref 39.0–52.0)
Hemoglobin: 7.1 g/dL — ABNORMAL LOW (ref 13.0–17.0)
MCH: 29 pg (ref 26.0–34.0)
MCHC: 31.6 g/dL (ref 30.0–36.0)
MCV: 91.8 fL (ref 80.0–100.0)
Platelets: 213 10*3/uL (ref 150–400)
RBC: 2.45 MIL/uL — ABNORMAL LOW (ref 4.22–5.81)
RDW: 20.9 % — ABNORMAL HIGH (ref 11.5–15.5)
WBC: 10 10*3/uL (ref 4.0–10.5)
nRBC: 0 % (ref 0.0–0.2)

## 2020-12-22 LAB — GLUCOSE, CAPILLARY
Glucose-Capillary: 117 mg/dL — ABNORMAL HIGH (ref 70–99)
Glucose-Capillary: 149 mg/dL — ABNORMAL HIGH (ref 70–99)
Glucose-Capillary: 204 mg/dL — ABNORMAL HIGH (ref 70–99)
Glucose-Capillary: 214 mg/dL — ABNORMAL HIGH (ref 70–99)
Glucose-Capillary: 91 mg/dL (ref 70–99)
Glucose-Capillary: 97 mg/dL (ref 70–99)

## 2020-12-22 MED ORDER — CHLORHEXIDINE GLUCONATE CLOTH 2 % EX PADS
6.0000 | MEDICATED_PAD | Freq: Every day | CUTANEOUS | Status: DC
  Administered 2020-12-23 – 2020-12-31 (×8): 6 via TOPICAL

## 2020-12-22 MED ORDER — FENTANYL CITRATE (PF) 100 MCG/2ML IJ SOLN
INTRAMUSCULAR | Status: AC
  Filled 2020-12-22: qty 4

## 2020-12-22 MED ORDER — MIDAZOLAM HCL 2 MG/2ML IJ SOLN
INTRAMUSCULAR | Status: AC | PRN
  Administered 2020-12-22: 1 mg via INTRAVENOUS
  Administered 2020-12-22: 0.5 mg via INTRAVENOUS

## 2020-12-22 MED ORDER — THIAMINE HCL 100 MG PO TABS
100.0000 mg | ORAL_TABLET | Freq: Every day | ORAL | Status: DC
  Administered 2020-12-22 – 2021-01-28 (×37): 100 mg
  Filled 2020-12-22 (×38): qty 1

## 2020-12-22 MED ORDER — LIDOCAINE-EPINEPHRINE 1 %-1:100000 IJ SOLN
INTRAMUSCULAR | Status: AC
  Filled 2020-12-22: qty 1

## 2020-12-22 MED ORDER — HEPARIN SODIUM (PORCINE) 1000 UNIT/ML IJ SOLN
INTRAMUSCULAR | Status: AC
  Filled 2020-12-22: qty 1

## 2020-12-22 MED ORDER — HEPARIN SODIUM (PORCINE) 1000 UNIT/ML IJ SOLN
INTRAMUSCULAR | Status: AC
  Administered 2020-12-22: 3200 [IU]
  Filled 2020-12-22: qty 4

## 2020-12-22 MED ORDER — MIDAZOLAM HCL 2 MG/2ML IJ SOLN
INTRAMUSCULAR | Status: AC
  Filled 2020-12-22: qty 4

## 2020-12-22 MED ORDER — FENTANYL CITRATE (PF) 100 MCG/2ML IJ SOLN
INTRAMUSCULAR | Status: AC | PRN
  Administered 2020-12-22 (×3): 25 ug via INTRAVENOUS

## 2020-12-22 MED ORDER — LIDOCAINE-EPINEPHRINE 2 %-1:100000 IJ SOLN
INTRAMUSCULAR | Status: AC | PRN
Start: 2020-12-22 — End: 2020-12-22
  Administered 2020-12-22: 20 mL

## 2020-12-22 MED ORDER — HEPARIN (PORCINE) 25000 UT/250ML-% IV SOLN
1250.0000 [IU]/h | INTRAVENOUS | Status: AC
  Administered 2020-12-22: 1250 [IU]/h via INTRAVENOUS
  Filled 2020-12-22: qty 250

## 2020-12-22 NOTE — Consult Note (Signed)
[place] Nurse ostomy consult note Patient receiving care in [facility] 2M13 Upon arriving to the room the bedside RN Janett Billow had already changed the pouch with supplies that were in the room. This pouch is a urostomy pouch and they have it connected to a bedside drainage bag. RN states he has had such high output that they needed him to have the drainage bag. Pouch not changed but will have the unit secretary to order a HOP pouch Stoma type/location: RUQ colostomy Stomal assessment/size: 2 1/4" pink budded Peristomal assessment: unable to view Treatment options for stomal/peristomal skin: Skin barrier ring Output: pouch empty Ostomy pouching: 2pc. High Output Kellie Simmering # [phone]) Skin Barrier Kellie Simmering # 2) Barrier Rings Kellie Simmering # [phone]) High Output 2 piece fecal pouching system   Skin Barrier, [place] # 2            High Output Pouch,        Dahlia Byes # [phone]                                                      [facility] # [phone]    Urine Collection Bag (Hook to spout of pouch) Kellie Simmering # [phone]   Thank you for the consult. [place] nurse will not follow at this time.   Please re-consult the [place] team if needed.  Cathlean Marseilles Tamala Julian, MSN, RN, [place], Lysle Pearl, [facility] Wound Treatment Associate Pager [phone]

## 2020-12-22 NOTE — Progress Notes (Signed)
ANTICOAGULATION CONSULT NOTE - Follow Up Consult  Pharmacy Consult for Heparin Indication: DVT  No Known Allergies  Patient Measurements: Height: 5\' 8"  (172.7 cm) Weight: 106.7 kg (235 lb 3.7 oz) IBW/kg (Calculated) : 68.4 Heparin Dosing Weight: 91 kg  Vital Signs: Temp: 97.6 F (36.4 C) (08/15 0307) Temp Source: Axillary (08/15 0307) BP: 123/80 (08/15 0300) Pulse Rate: 65 (08/15 0300)  Labs: Recent Labs    12/20/20 0356 12/21/20 0427 12/21/20 1727 12/22/20 0308  HGB 8.0* 7.3*  --  7.1*  HCT 24.2* 22.9*  --  22.5*  PLT 172 182  --  213  HEPARINUNFRC 0.31 0.27* 0.18* 0.47  CREATININE 3.33* 4.61*  --  5.43*     Estimated Creatinine Clearance: 16.3 mL/min (A) (by C-G formula based on SCr of 5.43 mg/dL (H)).   Medications:  Infusions:   sodium chloride Stopped (12/20/20 0955)    ceFAZolin (ANCEF) IV     dexmedetomidine (PRECEDEX) IV infusion 0.8 mcg/kg/hr (12/22/20 0300)   feeding supplement (VITAL 1.5 CAL) Stopped (12/22/20 0000)   heparin 1,250 Units/hr (12/22/20 0300)   levETIRAcetam Stopped (12/21/20 2102)   thiamine injection Stopped (12/21/20 2119)    Assessment: 65 yom admitted on 7/24 with high ostomy output, sepsis vs chemotherapy reaction; developed respiratory failure, aspiration, AKI, ischemic colitis.  He was initially on prophylactic heparin, then apixaban for Afib, then pharmacy was consulted to dose heparin IV for afib, then stopped for bleeding on 8/2 (also back in NSR), but remained on heparin in CRRT circuit.  Pharmacy was consulted for Argatroban for DVT on 8/5 due to concern for HITT.  Pharmacy consulted on 8/9 to change back to heparin with negative HIT Ab and SRA. -heparin level down to 0.18 after heparin increased to 1050 units/hr -hg= 7.3  8/15 AM update:  Heparin level therapeutic  Hgb low but stable from yesterday  Goal of Therapy:  Heparin level 0.3-0.7 units/mL Monitor platelets by anticoagulation protocol: Yes   Plan:  Cont  heparin 1250 units/hr 1200 heparin level  Narda Bonds, PharmD, BCPS Clinical Pharmacist Phone: [phone]

## 2020-12-22 NOTE — Procedures (Signed)
Interventional Radiology Procedure Note  Procedure: Placement of a LEFT IJ 19 cm Palindrome tunneled HD catheter.  Catheter tip at caroatrial junction.   Complications: None  Estimated Blood Loss: None  Recommendations: - Routine line care   Signed,  Criselda Peaches, MD

## 2020-12-22 NOTE — Progress Notes (Signed)
Patient complaining of abdominal pain approximately 2 hours after restarting tube feed at goal. Per MD this RN to decrease tube feed to 33mL/hr and slowly increase the tube feeds as the patient tolerates. Will continue to monitor.

## 2020-12-22 NOTE — Progress Notes (Signed)
NAME:  Thomas [name], MRN:  [id number], DOB:  1955/08/10, LOS: 21 ADMISSION DATE:  11/30/2020, CONSULTATION DATE:  7/30 REFERRING MD:  Riccardo Dubin, CHIEF COMPLAINT:  Worsening hypoxia/dyspnea  History of Present Illness:  65 yo M admitted for worsening sepsis vs reaction to chemotherapy with high ostomy output and soft BP on 7/25. Has had issues during his hospitalization with recurrent aspirations and more recently worsening respiratory status.  Most recently pt developed worsening hypoxia and was requiring BIPAP w/ FIO2 of 100% and increased work of breathing.   Who was admitted to intensive care and subsequently had worsening clinical status requiring intubation, CRRT, multiple pressors and pneumonia along with CT findings 8/1 concerning for ischemic bowel.   Significant Hospital Events:  7/27 Transferred to [facility], stopped abx as sepsis resolved/ruled out 7/30 AM Worsened AME, more hypoxic with worsening renal funciton, new aspiration on abx restarted 7/30 PM Pt with increased o2 requirement on 100% FIO2 on BIPAP, d/w family who wanted to proceed with intubation, pt intubated 8/1 Some issues with tachycardia and hypotension overnight. Reproted high ostomy output  8/2 down to 9 mcg of Levophed, however repeat CT abdomen/pelvis showed inflammatory wall thickening and fat stranding of the descending and sigmoid colon, new since prior, consistent with nonspecific infectious, inflammatory, or ischemic colitis.  Also new dense consolidative airspace opacity in the right lower and right middle lobes 8/3 further weaning NE. Weaning sedation. Full code, full scope per family meeting 8/2  8/4 off pressors, plt down to 103.  8/5 remains off pressors. Moving around more, not really following commands though  8/9-failed extubation 8/8, tolerated nasal cannula for about an hour, desaturations and inability to clear secretions-reintubated 8/8 8/10 some agitation overnight 8/11 tracheostomy  placement  Micro: Resp viral panel 7/24 > neg covid/ flu BC  7/24  x 2 neg  MRSA  7/25 neg  UC   7/25  neg  C diff screeen 7/26  neg ET culture 7/31 > moderate yeast> grew mod candida tropicalis GI panel  8/1> neg   Abx: Unasyn 7/30 >>7/31 Maxepime 7/31 >> 8/7 Flagyl 7/31 >>  8/7 Vancomycin 8/1 only  Anidulafungin 8/1>>8/3    Scheduled Meds:  atorvastatin  20 mg Per Tube Daily   chlorhexidine gluconate (MEDLINE KIT)  15 mL Mouth Rinse BID   Chlorhexidine Gluconate Cloth  6 each Topical Q1200   clonazepam  1 mg Per Tube TID   etomidate  40 mg Intravenous Once   famotidine  20 mg Per Tube Daily   feeding supplement (PROSource TF)  45 mL Per Tube BID   fentaNYL       heparin sodium (porcine)       hydrocerin   Topical BID   insulin aspart  0-15 Units Subcutaneous Q4H   insulin aspart  6 Units Subcutaneous Q4H   insulin detemir  25 Units Subcutaneous BID   lidocaine-EPINEPHrine       mouth rinse  15 mL Mouth Rinse QID   midazolam       QUEtiapine  25 mg Per Tube BID   thiamine  100 mg Per Tube Daily   Continuous Infusions:  sodium chloride Stopped (12/20/20 0955)   dexmedetomidine (PRECEDEX) IV infusion 0.2 mcg/kg/hr (12/22/20 1000)   feeding supplement (VITAL 1.5 CAL) Stopped (12/22/20 0000)   heparin Stopped (12/22/20 0842)   levETIRAcetam Stopped (12/22/20 0944)   PRN Meds:.sodium chloride, acetaminophen, albuterol, dextrose, docusate, fentaNYL (SUBLIMAZE) injection, sodium chloride flush    Interim History /  Subjective:  Patient tolerated trach collar for few hours yesterday This morning he was placed on trach collar, tolerating well so far Remained agitated overnight requiring Precedex infusion  Objective   Blood pressure 136/83, pulse 90, temperature 97.6 F (36.4 C), temperature source Axillary, resp. rate 15, height _0  (1.727 m), weight 106.7 kg, SpO2 100 %.    Vent Mode: PRVC FiO2 (%):  [35 %-40 %] 35 % Set Rate:  [14 bmp] 14 bmp Vt Set:  [550 mL]  550 mL PEEP:  [5 cmH20] 5 cmH20 Pressure Support:  [10 cmH20] 10 cmH20 Plateau Pressure:  [10 cmH20-19 cmH20] 19 cmH20   Intake/Output Summary (Last 24 hours) at 12/22/2020 1222 Last data filed at 12/22/2020 1000 Gross per 24 hour  Intake 1831 ml  Output 1175 ml  Net 656 ml   Filed Weights   12/20/20 0400 12/21/20 0431 12/22/20 0314  Weight: 107.3 kg 107.4 kg 106.7 kg     Physical exam: General: Acute on chronically ill-appearing male, s/p trach HEENT: [place]/AT, eyes anicteric.  On trach collar with thick mucousy secretions Neuro: Awake, following commands, moving all 4 extremities Chest: Coarse breath sounds, no wheezes or rhonchi Heart: Regular rate and rhythm, no murmurs or gallops Abdomen: Soft, nontender, nondistended, bowel sounds present Skin: No rash   Resolved Hospital Problem list   Shock  HHS Afib RVR  Lactic Acidosis  Thrombocytopenia colitis  Assessment & Plan:   Acute Metabolic encephalopathy History of CVA in 2015 Seizure disorder Mental status has improved Still requiring low-dose Precedex, try to wean off Continue with Keppra Continue seroquel BID Minimize sedation as possible  Acute hypoxemic respiratory failure s/p trach Aspiration pneumonia Patient had failed extubation 8/8 secondary to increased secretions -> low O2 sats. Tracheostomy performed 8/11 due to excessive secretions, difficult vent wean. Completed course of antibiotics on 8/7 - last chest xray (8/8) without focal lung disease. Continue pulmonary hygiene Repeat respiratory culture Continue trach collar as tolerated  AKI on chronic kidney disease stage IIIb, now HD dependent Management per nephrology.  Last HD on 8/12 Tunneled catheter was placed today, I scheduled for IHD today If he gets hypotensive again with dialysis prefer midodrine rather than pressors.   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 Ammie Dalton  Chronic heart failure with preserved ejection fraction -Last ejection fraction of 60 to 65% on last echo Monitor intake and output   Hyperlipidemia Continue home lipitor  Type 2 diabetes Continue Levemir and sliding scale insulin CBG goal 140-180  Capecitabine induced desquamation Hand/foot syndrome Continue moisturizer to hands and feet and scrotum   Best Practice  Diet/type: Resume tube feeds DVT prophylaxis: SCD/ heparin GI prophylaxis: H2B Lines: Port, femoral trialysis Foley:  N/A Code Status:  full code Last date of goals of care discussion : 8/14 continue full aggressive care   Total critical care time: 35 minutes  Performed by: [place] care time was exclusive of separately billable procedures and treating other patients.   Critical care was necessary to treat or prevent imminent or life-threatening deterioration.   Critical care was time spent personally by me on the following activities: development of treatment plan with patient and/or surrogate as well as nursing, discussions with consultants, evaluation of patient's response to treatment, examination of patient, obtaining history from patient or surrogate, ordering and performing treatments and interventions,  ordering and review of laboratory studies, ordering and review of radiographic studies, pulse oximetry and re-evaluation of patient's condition.   Jacky Kindle MD [facility] See Amion for pager If no response to pager, please call [phone] until 7pm After 7pm, Please call E-link [phone]

## 2020-12-22 NOTE — Progress Notes (Signed)
ANTICOAGULATION CONSULT NOTE - Follow Up Consult  Pharmacy Consult for Heparin Indication: DVT  No Known Allergies  Patient Measurements: Height: 5\' 8"  (172.7 cm) Weight: 107.5 kg (236 lb 15.9 oz) IBW/kg (Calculated) : 68.4 Heparin Dosing Weight: 91 kg  Vital Signs: Temp: 98.3 F (36.8 C) (08/15 2000) Temp Source: Axillary (08/15 2000) BP: 116/67 (08/15 2215) Pulse Rate: 99 (08/15 2215)  Labs: Recent Labs    12/20/20 0356 12/21/20 0427 12/21/20 1727 12/22/20 0308 12/22/20 2148  HGB 8.0* 7.3*  --  7.1*  --   HCT 24.2* 22.9*  --  22.5*  --   PLT 172 182  --  213  --   HEPARINUNFRC 0.31 0.27* 0.18* 0.47 0.45  CREATININE 3.33* 4.61*  --  5.43*  --      Estimated Creatinine Clearance: 16.3 mL/min (A) (by C-G formula based on SCr of 5.43 mg/dL (H)).   Medications:  Infusions:   sodium chloride Stopped (12/20/20 0955)   dexmedetomidine (PRECEDEX) IV infusion 0.4 mcg/kg/hr (12/22/20 1948)   feeding supplement (VITAL 1.5 CAL) 20 mL/hr at 12/22/20 1714   heparin 1,250 Units/hr (12/22/20 1902)   levETIRAcetam Stopped (12/22/20 2147)    Assessment: 49 yom admitted on 7/24 with high ostomy output, sepsis vs chemotherapy reaction; developed respiratory failure, aspiration, AKI, ischemic colitis.  He was initially on prophylactic heparin, then apixaban for Afib, then pharmacy was consulted to dose heparin IV for afib, then stopped for bleeding on 8/2 (also back in NSR), but remained on heparin in CRRT circuit.  Pharmacy was consulted for Argatroban for DVT on 8/5 due to concern for HITT.  Pharmacy consulted on 8/9 to change back to heparin with negative HIT Ab and SRA. -heparin level down to 0.18 after heparin increased to 1050 units/hr -hg= 7.3  8/15 PM update:  Heparin level therapeutic  Hgb low but stable from yesterday  Goal of Therapy:  Heparin level 0.3-0.7 units/mL Monitor platelets by anticoagulation protocol: Yes   Plan:  Cont heparin 1250 units/hr Heparin  level with am labs  Alanda Slim, PharmD, [facility] Clinical Pharmacist Please see AMION for all Pharmacists' Contact Phone Numbers 12/22/2020, 10:35 PM

## 2020-12-22 NOTE — Progress Notes (Signed)
SLP Cancellation Note  Patient Details Name: Thomas [name] MRN: [id number] DOB: 07-21-1955   Cancelled treatment:       Reason Eval/Treat Not Completed: Medical issues which prohibited therapy (Case discussed with RN who recommended that PMSV be deferred due to pt having significant secretions at this time. SLP will follow up on subsequent date.)  [name], [name], [name] Office number [phone] Pager [place] 12/22/2020, 2:31 PM

## 2020-12-22 NOTE — Progress Notes (Signed)
Patient ID: Thomas [name], male   DOB: 12/15/1955, 65 y.o.   MRN: [id number] [facility] Progress Note   Assessment/ Plan:   AKI/CKD stage IIIb - ischemic ATN in setting of severe sepsis and multisystem organ failure.  Admission creatinine was 2.1 and he was transiently on CRRT (stopped after 8/9) and transitioned to IHD via left femoral catheter.  On schedule for placement of tunneled hemodialysis catheter today by interventional radiology with dialysis thereafter. Altered mental status-with prior history of CVA in 2015 and seizure disorder,.  Volume overload-improving volume status with hemodialysis/ultrafiltration, appears far from his dry weight based on physical exam and we will continue HD/UF. Sepsis/aspiration pneumonia -he is currently hemodynamically stable of pressors and has completed his course of  IV cefepime and vancomycin Acute hypoxic respiratory failure -continue efforts to optimize volume with hemodialysis/ultrafiltration, ventilator adjustments per CCM/RT.  Stage IIIB colon cancer - sp partial colectomy, colostomy. On chemoRx sp 3 cycles Right leg DVT - anticoagulation per primary team  Atrial fibrillation - s/p amiodarone, dilt; per primary service. Anemia of critical illness: Continue to monitor hemoglobin/hematocrit trend-holding erythropoietin with active malignancy.  Subjective:   Without acute events overnight, on schedule for [facility] placement by IR today.  Bladder scan yesterday showed 375 cc urine-in and out cath yielded dark urine.   Objective:   BP 115/74   Pulse 68   Temp 97.6 F (36.4 C) (Axillary)   Resp 14   Ht 5' 8" (1.727 m)   Wt 106.7 kg   SpO2 100%   BMI 35.77 kg/m   Intake/Output Summary (Last 24 hours) at 12/22/2020 0556 Last data filed at 12/22/2020 0500 Gross per 24 hour  Intake 2629.56 ml  Output 1175 ml  Net 1454.56 ml   Weight change: -0.7 kg  Physical Exam: Gen: Appears comfortable, resting on ventilator via  tracheostomy CVS: Pulse regular rhythm, normal rate, S1 and S2 normal Resp: Anteriorly clear to auscultation, no rales/rhonchi Abd: Soft, nontender, ostomy in place Ext: 2+ pitting edema over upper and lower extremities, left femoral dialysis catheter  Imaging: No results found.  Labs: BMET Recent Labs  Lab 12/15/20 1528 12/16/20 0417 12/18/20 0433 12/19/20 0354 12/20/20 0356 12/21/20 0427 12/22/20 0308  NA 138 136 139 139 132* 137 137  K 4.5 4.8 4.7 4.4 3.5 4.2 4.5  CL 103 102 107 109 101 106 110  CO2 25 26 19* 16* 19* 20* 17*  GLUCOSE 234* 315* 221* 104* 340* 335* 220*  BUN 37* 37* 89* 106* 49* 58* 70*  CREATININE 1.61* 1.57* 3.85* 4.78* 3.33* 4.61* 5.43*  CALCIUM 7.1* 7.3* 7.6* 7.5* 7.1* 6.9* 7.1*  PHOS 3.1 3.3 7.8* 8.6* 4.8*  --  4.9*   CBC Recent Labs  Lab 12/18/20 0433 12/19/20 0354 12/20/20 0356 12/21/20 0427 12/22/20 0308  WBC 15.7*  15.9* 15.1* 11.9* 11.7* 10.0  NEUTROABS 12.1*  --   --   --   --   HGB 8.0*  8.3* 7.7* 8.0* 7.3* 7.1*  HCT 25.8*  25.6* 23.4* 24.2* 22.9* 22.5*  MCV 91.5  90.1 89.0 87.7 91.2 91.8  PLT 154  144* 149* 172 182 213    Medications:     atorvastatin  20 mg Per Tube Daily   chlorhexidine gluconate (MEDLINE KIT)  15 mL Mouth Rinse BID   Chlorhexidine Gluconate Cloth  6 each Topical Q1200   clonazepam  1 mg Per Tube TID   docusate  100 mg Per Tube BID   etomidate  40 mg Intravenous Once   famotidine  20 mg Per Tube Daily   feeding supplement (PROSource TF)  45 mL Per Tube BID   hydrocerin   Topical BID   insulin aspart  0-15 Units Subcutaneous Q4H   insulin aspart  6 Units Subcutaneous Q4H   insulin detemir  25 Units Subcutaneous BID   mouth rinse  15 mL Mouth Rinse QID   polyethylene glycol  17 g Per Tube Daily   QUEtiapine  25 mg Per Tube BID   Elmarie Shiley, MD 12/22/2020, 5:56 AM

## 2020-12-22 NOTE — Progress Notes (Signed)
OT Cancellation Note  Patient Details Name: SIRIS HOOS MRN: [id number] DOB: [birth year]   Cancelled Treatment:    Reason Eval/Treat Not Completed: Patient at procedure or test/ unavailable. Off floor to IR for tunneled cath placement. OT to attempt evaluation 8/15.  [name] OTR/L Supplemental OT, Department of rehab services [phone]  Abdulraheem Pineo R H. 12/22/2020, 12:00 PM

## 2020-12-22 NOTE — Progress Notes (Signed)
Patient transported to IR on trach collar. Vital signs stable. Report given to IR RN and informed of patient being in bilateral wrist restraints. This RN's number given to IR RN in case of any concerns/questions.

## 2020-12-23 ENCOUNTER — Inpatient Hospital Stay (HOSPITAL_COMMUNITY): Payer: Medicare HMO

## 2020-12-23 DIAGNOSIS — E1100 Type 2 diabetes mellitus with hyperosmolarity without nonketotic hyperglycemic-hyperosmolar coma (NKHHC): Secondary | ICD-10-CM | POA: Diagnosis not present

## 2020-12-23 DIAGNOSIS — J9601 Acute respiratory failure with hypoxia: Secondary | ICD-10-CM | POA: Diagnosis not present

## 2020-12-23 DIAGNOSIS — N170 Acute kidney failure with tubular necrosis: Secondary | ICD-10-CM | POA: Diagnosis not present

## 2020-12-23 DIAGNOSIS — G9341 Metabolic encephalopathy: Secondary | ICD-10-CM | POA: Diagnosis not present

## 2020-12-23 LAB — HEPARIN LEVEL (UNFRACTIONATED): Heparin Unfractionated: 0.53 IU/mL (ref 0.30–0.70)

## 2020-12-23 LAB — CBC
HCT: 21.7 % — ABNORMAL LOW (ref 39.0–52.0)
Hemoglobin: 7.1 g/dL — ABNORMAL LOW (ref 13.0–17.0)
MCH: 29.6 pg (ref 26.0–34.0)
MCHC: 32.7 g/dL (ref 30.0–36.0)
MCV: 90.4 fL (ref 80.0–100.0)
Platelets: 184 10*3/uL (ref 150–400)
RBC: 2.4 MIL/uL — ABNORMAL LOW (ref 4.22–5.81)
RDW: 20.2 % — ABNORMAL HIGH (ref 11.5–15.5)
WBC: 9 10*3/uL (ref 4.0–10.5)
nRBC: 0 % (ref 0.0–0.2)

## 2020-12-23 LAB — RENAL FUNCTION PANEL
Albumin: 1.7 g/dL — ABNORMAL LOW (ref 3.5–5.0)
Anion gap: 12 (ref 5–15)
BUN: 37 mg/dL — ABNORMAL HIGH (ref 8–23)
CO2: 23 mmol/L (ref 22–32)
Calcium: 7.9 mg/dL — ABNORMAL LOW (ref 8.9–10.3)
Chloride: 103 mmol/L (ref 98–111)
Creatinine, Ser: 3.9 mg/dL — ABNORMAL HIGH (ref 0.61–1.24)
GFR, Estimated: 16 mL/min — ABNORMAL LOW (ref >60)
Glucose, Bld: 136 mg/dL — ABNORMAL HIGH (ref 70–99)
Phosphorus: 3.4 mg/dL (ref 2.5–4.6)
Potassium: 3.8 mmol/L (ref 3.5–5.1)
Sodium: 138 mmol/L (ref 135–145)

## 2020-12-23 LAB — GLUCOSE, CAPILLARY
Glucose-Capillary: 105 mg/dL — ABNORMAL HIGH (ref 70–99)
Glucose-Capillary: 125 mg/dL — ABNORMAL HIGH (ref 70–99)
Glucose-Capillary: 127 mg/dL — ABNORMAL HIGH (ref 70–99)
Glucose-Capillary: 149 mg/dL — ABNORMAL HIGH (ref 70–99)
Glucose-Capillary: 150 mg/dL — ABNORMAL HIGH (ref 70–99)
Glucose-Capillary: 160 mg/dL — ABNORMAL HIGH (ref 70–99)

## 2020-12-23 MED ORDER — NUTRISOURCE FIBER PO PACK
1.0000 | PACK | Freq: Two times a day (BID) | ORAL | Status: DC
  Administered 2020-12-23 – 2021-01-28 (×71): 1
  Filled 2020-12-23 (×80): qty 1

## 2020-12-23 MED ORDER — APIXABAN 5 MG PO TABS
5.0000 mg | ORAL_TABLET | Freq: Two times a day (BID) | ORAL | Status: DC
  Administered 2020-12-23 – 2021-01-01 (×19): 5 mg
  Filled 2020-12-23 (×19): qty 1

## 2020-12-23 MED ORDER — PANTOPRAZOLE SODIUM 40 MG PO PACK
40.0000 mg | PACK | Freq: Every day | ORAL | Status: AC
  Administered 2020-12-23 – 2020-12-29 (×7): 40 mg
  Filled 2020-12-23 (×7): qty 20

## 2020-12-23 MED ORDER — INSULIN DETEMIR 100 UNIT/ML ~~LOC~~ SOLN
18.0000 [IU] | Freq: Two times a day (BID) | SUBCUTANEOUS | Status: DC
  Administered 2020-12-23 – 2021-01-04 (×23): 18 [IU] via SUBCUTANEOUS
  Filled 2020-12-23 (×27): qty 0.18

## 2020-12-23 MED ORDER — QUETIAPINE FUMARATE 50 MG PO TABS
50.0000 mg | ORAL_TABLET | Freq: Two times a day (BID) | ORAL | Status: DC
  Administered 2020-12-23 – 2020-12-29 (×14): 50 mg
  Filled 2020-12-23 (×15): qty 1

## 2020-12-23 MED ORDER — FAMOTIDINE 20 MG PO TABS: 20.0000 mg | ORAL_TABLET | Freq: Two times a day (BID) | ORAL | Status: DC

## 2020-12-23 MED ORDER — SODIUM CHLORIDE 0.9 % IV SOLN
2.0000 g | INTRAVENOUS | Status: DC
Start: 2020-12-23 — End: 2020-12-24
  Administered 2020-12-23 – 2020-12-24 (×2): 2 g via INTRAVENOUS
  Filled 2020-12-23 (×2): qty 20

## 2020-12-23 MED ORDER — GLYCOPYRROLATE 0.2 MG/ML IJ SOLN
0.1000 mg | Freq: Once | INTRAMUSCULAR | Status: AC
  Administered 2020-12-23: 0.1 mg via INTRAVENOUS
  Filled 2020-12-23: qty 1

## 2020-12-23 NOTE — Evaluation (Signed)
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   Dizziness    Dyspnea    pt states d/t weight   Epilepsy ([place])    takes Keppra daily   GERD (gastroesophageal reflux disease)    takes Omeprazole daily   Headache    Hyperlipidemia    takes Atorvastatin daily   Hypertension    takes Lotrel daily   Peripheral edema    takes Lasix daily   Peripheral neuropathy    takes Gabapentin daily   Pneumonia 3 yrs ago   hx of   Seizures ([place])    Sleep apnea    Urinary frequency    Past Surgical History:  Past Surgical History:  Procedure Laterality Date   BIOPSY  09/05/2020   Procedure: BIOPSY;  Surgeon: Irene Shipper, MD;  Location: [facility] ENDOSCOPY;  Service: Endoscopy;;   CARDIAC CATHETERIZATION  7 yrs ago   COLONOSCOPY WITH PROPOFOL N/A 09/05/2020   Procedure: COLONOSCOPY WITH PROPOFOL;  Surgeon: Irene Shipper, MD;  Location: [facility] ENDOSCOPY;  Service: Endoscopy;  Laterality: N/A;   CRANIOTOMY N/A 09/01/2016   Procedure: CRANIOTOMY TUMOR  LEFT PTERIONAL;  Surgeon: Ashok Pall, MD;  Location: [place];  Service: Neurosurgery;  Laterality: N/A;   cyst removed from chest      as a child   IR FLUORO GUIDE CV LINE LEFT  12/22/2020   IR US GUIDE VASC ACCESS LEFT  12/22/2020   NO PAST SURGERIES     PARTIAL COLECTOMY N/A 09/10/2020   Procedure: TRANSVERSE COLECTOMY;  Surgeon: [name], MD;  Location: [place];  Service: General;  Laterality: N/A;   POLYPECTOMY   09/05/2020   Procedure: POLYPECTOMY;  Surgeon: Irene Shipper, MD;  Location: [place];  Service: Endoscopy;;   PORTACATH PLACEMENT Right 10/01/2020   Procedure: INSERTION PORT-A-CATH;  Surgeon: [name], MD;  Location: [place];  Service: General;  Laterality: Right;   SUBMUCOSAL TATTOO INJECTION  09/05/2020   Procedure: SUBMUCOSAL TATTOO INJECTION;  Surgeon: Irene Shipper, MD;  Location: [facility] ENDOSCOPY;  Service: Endoscopy;;   HPI:  Pt is a 65 y/o male admitted on 7/25 for worsening sepsis vs reaction to chemotherapy with high ostomy output and soft BP. Pt developed worsening hypoxia and was requiring BIPAP w/ FIO2 of 100% and increased work of breathing. SLP consulted due to concern for aspiration. BSE 7/28 recommended NPO status and MBS, but this could not completed subsequently due to lethargy. CXR in AM of 7/30 showed some worsening concern of aspiration. ETT 7/30-8/8; tolerated nasal cannula for about an hour; reintubated 8/8-trach 8/11. 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   Assessment / Plan / Recommendation Clinical Impression  Pt demonstrates good cognition and arousal with PMSV trial, but struggled to maintain strength to continually expectorate moderate secretions after cuff deflation. Initially, with deflation, pt had significant oral and tracheal expectoration of thick old and then bright red blood and secretions. Eventually pt transitioned to intermittent oral expectoration of secretions. His sats were stable and no air trapping observed. Pt mostly aphonic with good flow of air to upper airway. Pt appeared to fatigue with so much coughing. Advised RN pt may need cuff reinflated if his vitals change over time with cuff down.  For now, pt is recommended to wear PMSV for brief periods with full staff supervision. His ability to speak is impacted by decreased breath support and likely changes to tracheal tissue. will continue efforts. SLP Visit Diagnosis: Dysphagia, oropharyngeal phase (R13.12)    SLP Assessment  Patient needs continued Speech Lanaguage Pathology Services    Follow Up Recommendations       Frequency and Duration min 2x/week  2 weeks    PMSV Trial PMSV was placed for: 17 mintues Able to redirect subglottic air through upper airway: Yes Able to Attain Phonation: Yes Voice Quality: Breathy;Aphonic Able to Expectorate Secretions: Yes Level of Secretion Expectoration with PMSV: Oral Breath Support for Phonation: Severely decreased Intelligibility: Intelligibility reduced Word: 75-100% accurate Phrase: 75-100% accurate Respirations During Trial: 20 SpO2 During Trial: 98 % Behavior: Alert;Cooperative   Tracheostomy Tube  Additional Tracheostomy Tube Assessment Trach Collar Period: all waking hours Secretion Description: thin bloody Frequency of Tracheal Suctioning: every hour, trying to avoid due to heparin and bleeding Level of Secretion Expectoration: Tracheal    Vent Dependency  Vent Dependent: No FiO2 (%): 35 %    Cuff Deflation Trial  GO Tolerated Cuff Deflation: Yes Length of Time for Cuff Deflation Trial: 20 minutes Behavior: Alert;Cooperative        Maksymilian Mabey, Katherene Ponto 12/23/2020, 10:56 AM

## 2020-12-23 NOTE — Progress Notes (Signed)
ANTICOAGULATION CONSULT NOTE - Follow Up Consult  Pharmacy Consult for Heparin>>Apixaban  Indication: DVT  No Known Allergies  Patient Measurements: Height: 5\' 8"  (172.7 cm) Weight: 105.5 kg (232 lb 9.4 oz) IBW/kg (Calculated) : 68.4 Heparin Dosing Weight: 91 kg  Vital Signs: Temp: 98.5 F (36.9 C) (08/16 0747) Temp Source: Oral (08/16 0747) BP: 138/118 (08/16 0707) Pulse Rate: 77 (08/16 0707)  Labs: Recent Labs    12/21/20 0427 12/21/20 1727 12/22/20 0308 12/22/20 2148 12/23/20 0543  HGB 7.3*  --  7.1*  --  7.1*  HCT 22.9*  --  22.5*  --  21.7*  PLT 182  --  213  --  184  HEPARINUNFRC 0.27*   < > 0.47 0.45 0.53  CREATININE 4.61*  --  5.43*  --   --    < > = values in this interval not displayed.     Estimated Creatinine Clearance: 16.2 mL/min (A) (by C-G formula based on SCr of 5.43 mg/dL (H)).   Medications:  Infusions:   sodium chloride Stopped (12/20/20 0955)   cefTRIAXone (ROCEPHIN)  IV     feeding supplement (VITAL 1.5 CAL) 40 mL/hr at 12/23/20 0700   heparin 1,250 Units/hr (12/23/20 0700)   levETIRAcetam Stopped (12/22/20 2147)    Assessment: Thomas [name] admitted on 7/24 with high ostomy output, sepsis vs chemotherapy reaction; developed respiratory failure, aspiration, AKI, ischemic colitis.  He was initially on prophylactic heparin, then apixaban for Afib, then pharmacy was consulted to dose heparin IV for afib, then stopped for bleeding on 8/2 (also back in NSR), but remained on heparin in CRRT circuit.  Pharmacy was consulted for Argatroban for DVT on 8/5 due to concern for HITT.  Restarted heparin 8/9 with negative HIT Ab and SRA.  8/16 heparin level therapeutic @ 0.53 with heparin running at 1,250 units/h. Hgb 7.1 and consistently low, but stable.  8/16 pharmacy consulted to switch from heparin to apixaban for treatment of DVT. Discussed with Dr. Tacy Learn apixaban would be the preferred DOAC for tx of DVT in ESRD, but limited data compared to apixaban for AF  in ESRD patients. Dr. Tacy Learn stated he wished to proceed with apixaban. Patient has been receiving therapeutic anticoagulation with various agents (see above) since 7/28, so will not load patient with apixaban. No signs of bleeding noted on therapeutic heparin.    Goal of Therapy:  Monitor platelets by anticoagulation protocol: Yes   Plan:  STOP Heparin  START apixaban 5 mg BID  Daily CBC  Monitor for s/sx of bleeding   Adria Dill, PharmD PGY-1 Acute Care Resident  12/23/2020 8:35 AM

## 2020-12-23 NOTE — Plan of Care (Signed)
  Problem: Pain Managment: Goal: General experience of comfort will improve Outcome: Progressing   

## 2020-12-23 NOTE — Progress Notes (Addendum)
NAME:  Thomas [name], MRN:  [id number], DOB:  [birth year], LOS: 70 ADMISSION DATE:  11/30/2020, CONSULTATION DATE:  7/30 REFERRING MD:  Riccardo Dubin, CHIEF COMPLAINT:  Worsening hypoxia/dyspnea  History of Present Illness:  65 yo M admitted for worsening sepsis vs reaction to chemotherapy with high ostomy output and soft BP on 7/25. Has had issues during his hospitalization with recurrent aspirations and more recently worsening respiratory status.  Most recently pt developed worsening hypoxia and was requiring BIPAP w/ FIO2 of 100% and increased work of breathing.   Who was admitted to intensive care and subsequently had worsening clinical status requiring intubation, CRRT, multiple pressors and pneumonia along with CT findings 8/1 concerning for ischemic bowel.   Significant Hospital Events:  7/27 Transferred to [facility], stopped abx as sepsis resolved/ruled out 7/30 AM Worsened AME, more hypoxic with worsening renal funciton, new aspiration on abx restarted 7/30 PM Pt with increased o2 requirement on 100% FIO2 on BIPAP, d/w family who wanted to proceed with intubation, pt intubated 8/1 Some issues with tachycardia and hypotension overnight. Reproted high ostomy output  8/2 down to 9 mcg of Levophed, however repeat CT abdomen/pelvis showed inflammatory wall thickening and fat stranding of the descending and sigmoid colon, new since prior, consistent with nonspecific infectious, inflammatory, or ischemic colitis.  Also new dense consolidative airspace opacity in the right lower and right middle lobes 8/3 further weaning NE. Weaning sedation. Full code, full scope per family meeting 8/2  8/4 off pressors, plt down to 103.  8/5 remains off pressors. Moving around more, not really following commands though  8/9-failed extubation 8/8, tolerated nasal cannula for about an hour, desaturations and inability to clear secretions-reintubated 8/8 8/10 some agitation overnight 8/11 tracheostomy  placement  Micro: Resp viral panel 7/24 > neg covid/ flu BC  7/24  x 2 neg  MRSA  7/25 neg  UC   7/25  neg  C diff screeen 7/26  neg ET culture 7/31 > moderate yeast> grew mod candida tropicalis GI panel  8/1> neg   Abx: Unasyn 7/30 >>7/31 Maxepime 7/31 >> 8/7 Flagyl 7/31 >>  8/7 Vancomycin 8/1 only  Anidulafungin 8/1>>8/3   Scheduled Meds:  apixaban  5 mg Per Tube BID   atorvastatin  20 mg Per Tube Daily   chlorhexidine gluconate (MEDLINE KIT)  15 mL Mouth Rinse BID   Chlorhexidine Gluconate Cloth  6 each Topical Q1200   clonazepam  1 mg Per Tube TID   famotidine  20 mg Per Tube BID   feeding supplement (PROSource TF)  45 mL Per Tube BID   hydrocerin   Topical BID   insulin aspart  0-15 Units Subcutaneous Q4H   insulin aspart  6 Units Subcutaneous Q4H   insulin detemir  25 Units Subcutaneous BID   mouth rinse  15 mL Mouth Rinse QID   QUEtiapine  50 mg Per Tube BID   thiamine  100 mg Per Tube Daily   Continuous Infusions:  sodium chloride Stopped (12/20/20 0955)   cefTRIAXone (ROCEPHIN)  IV 200 mL/hr at 12/23/20 0900   feeding supplement (VITAL 1.5 CAL) 50 mL/hr at 12/23/20 1039   heparin 1,250 Units/hr (12/23/20 0900)   levETIRAcetam 500 mg (12/23/20 1011)   PRN Meds:.sodium chloride, acetaminophen, albuterol, dextrose, docusate, fentaNYL (SUBLIMAZE) injection, sodium chloride flush    Interim History / Subjective:  Patient tolerated trach collar for >24h Has been complaining of abdominal pain since yesterday intermittently Was on Precedex infusion last night, it was  discontinued this morning  Objective   Blood pressure 110/68, pulse 81, temperature 98.5 F (36.9 C), temperature source Oral, resp. rate (!) 23, height 5' 8"  (1.727 m), weight 105.5 kg, SpO2 98 %.    FiO2 (%):  [35 %] 35 %   Intake/Output Summary (Last 24 hours) at 12/23/2020 1053 Last data filed at 12/23/2020 0900 Gross per 24 hour  Intake 1400.82 ml  Output 3525 ml  Net -2124.18 ml   Filed  Weights   12/22/20 0314 12/22/20 2135 12/23/20 0500  Weight: 106.7 kg 107.5 kg 105.5 kg     Physical exam: General: Acute on chronically ill-appearing male, s/p trach HEENT: [place]/AT, eyes anicteric.  On trach collar with thick  secretions Neuro: Awake, following commands, moving all 4 extremities Chest: Coarse breath sounds, no wheezes or rhonchi Heart: Regular rate and rhythm, no murmurs or gallops Abdomen: Soft, mildly tender in epigastrium, nondistended, bowel sounds present Skin: No rash  Resolved Hospital Problem list   Shock  HHS Afib RVR  Lactic Acidosis  Thrombocytopenia colitis  Assessment & Plan:  Acute Metabolic encephalopathy History of CVA in 2015 Seizure disorder Patient is back to his baseline mental status Precedex was stopped this morning Continue Seroquel 50 mg twice daily Continue with Keppra Avoid sedation  Acute hypoxemic respiratory failure s/p trach Aspiration pneumonia Patient had failed extubation 8/8 secondary to increased secretions -> low O2 sats. Tracheostomy performed 8/11 due to excessive secretions, difficult vent wean. Completed course of antibiotics on 8/7 - last chest xray (8/8) without focal lung disease. 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 Ammie Dalton  Chronic heart failure with preserved ejection fraction Last ejection fraction of 60 to 65% on last  echo Monitor intake and output  Acute Rt Leg DVT Switch heparin to eliquis 66m bid  Hyperlipidemia Continue home lipitor  Type 2 diabetes Continue Levemir and sliding scale insulin CBG goal 140-180  Capecitabine induced desquamation Hand/foot syndrome Continue moisturizer to hands and feet and scrotum  Anemia of critical illness H&H is stable Transfuse if <7   Best Practice  Diet/type: Resume tube feeds DVT prophylaxis: SCD/ heparin GI prophylaxis: H2B Lines: Port, femoral trialysis Foley:  N/A Code Status:  full code Last date of goals of care discussion : 8/14 continue full aggressive care   Total critical care time: 32 minutes  Performed by: [place] time was exclusive of separately billable procedures and treating other patients.   Critical care was necessary to treat or prevent imminent or life-threatening deterioration.   Critical care was time spent personally by me on the following activities: development of treatment plan with patient and/or surrogate as well as nursing, discussions with consultants, evaluation of patient's response to treatment, examination of patient, obtaining history from patient or surrogate, ordering and performing treatments and interventions, ordering and review of laboratory studies, ordering and review of radiographic studies, pulse oximetry and re-evaluation of patient's condition.   SJacky KindleMD [facility]  Critical Care See Amion for pager If no response to pager, please call [phone] until 7pm After 7pm, Please call E-link [phone]

## 2020-12-23 NOTE — Progress Notes (Signed)
RT note. Patient trach collar tubing changed at this time.

## 2020-12-23 NOTE — Progress Notes (Signed)
Patient ID: Thomas [name], male   DOB: Oct 09, 1955, 65 y.o.   MRN: [id number] [facility] Progress Note   Assessment/ Plan:   AKI/CKD stage IIIb - ischemic ATN in setting of severe sepsis and multisystem organ failure.  Admission creatinine was 2.1 and he was transiently on CRRT (stopped after 8/9) and transitioned to IHD-underwent left IJ [facility] yesterday anticipating prolonged dialysis needs.  We will continue to follow labs daily along with urine output to assess for potential recovery. Altered mental status-with prior history of CVA in 2015 and seizure disorder,.  Volume overload-improving volume status with hemodialysis/ultrafiltration, appears far from his dry weight based on physical exam and we will continue HD/UF. Sepsis/aspiration pneumonia -he is currently hemodynamically stable of pressors and has completed his course of  IV cefepime and vancomycin Acute hypoxic respiratory failure -continue efforts to optimize volume with hemodialysis/ultrafiltration, ventilator adjustments per CCM/RT.  Stage IIIB colon cancer - sp partial colectomy, colostomy. On chemoRx sp 3 cycles Right leg DVT - anticoagulation per primary team  Atrial fibrillation - s/p amiodarone, dilt; per primary service. Anemia of critical illness: Continue to monitor hemoglobin/hematocrit trend-holding erythropoietin with active malignancy.  Subjective:   Had left IJ tunneled hemodialysis catheter placed yesterday by interventional radiology followed by hemodialysis later.  He motions that he has abdominal pain when asked   Objective:   BP 129/80   Pulse 78   Temp 98.6 F (37 C) (Axillary)   Resp (!) 29   Ht 5' 8"  (1.727 m)   Wt 105.5 kg   SpO2 100%   BMI 35.36 kg/m   Intake/Output Summary (Last 24 hours) at 12/23/2020 0640 Last data filed at 12/23/2020 0600 Gross per 24 hour  Intake 1307.23 ml  Output 3050 ml  Net -1742.77 ml   Weight change: 0.8 kg  Physical Exam: Gen: Appears resting comfortably  on ventilator via tracheostomy CVS: Pulse regular rhythm, normal rate, S1 and S2 normal Resp: Anteriorly clear to auscultation, no rales/rhonchi.  Left IJ TDC in situ Abd: Soft, nontender, ostomy in place Ext: 1+ bilateral lower extremity edema, 2+ upper extremity edema.  Imaging: IR Fluoro Guide CV Line Left  Result Date: 12/22/2020 INDICATION: 65 year old male in need of continued hemodialysis. He currently has a right inguinal temporary hemodialysis catheter and presents for placement of a more durable tunneled hemodialysis catheter. EXAM: TUNNELED CENTRAL VENOUS HEMODIALYSIS CATHETER PLACEMENT WITH ULTRASOUND AND FLUOROSCOPIC GUIDANCE MEDICATIONS: 2 g Ancef. The antibiotic was given in an appropriate time interval prior to skin puncture. ANESTHESIA/SEDATION: Moderate (conscious) sedation was employed during this procedure. A total of Versed 1.5 mg and Fentanyl 75 mcg was administered intravenously. Moderate Sedation Time: 33 minutes. The patient's level of consciousness and vital signs were monitored continuously by radiology nursing throughout the procedure under my direct supervision. FLUOROSCOPY TIME:  Fluoroscopy Time: 1 minutes 48 seconds (14 mGy). COMPLICATIONS: None immediate. PROCEDURE: Informed written consent was obtained from the patient after a discussion of the risks, benefits, and alternatives to treatment. Questions regarding the procedure were encouraged and answered. The left neck and chest were prepped with chlorhexidine in a sterile fashion, and a sterile drape was applied covering the operative field. Maximum barrier sterile technique with sterile gowns and gloves were used for the procedure. A timeout was performed prior to the initiation of the procedure. After creating a small venotomy incision, a micropuncture kit was utilized to access the left internal jugular vein under direct, real-time ultrasound guidance after the overlying soft tissues were anesthetized  with 1% lidocaine  with epinephrine. Ultrasound image documentation was performed. The microwire was kinked to measure appropriate catheter length. A stiff Glidewire was advanced to the level of the IVC and the micropuncture sheath was exchanged for a peel-away sheath. A palindrome tunneled hemodialysis catheter measuring 23 cm from tip to cuff was tunneled in a retrograde fashion from the anterior chest wall to the venotomy incision. The catheter was then placed through the peel-away sheath with tips ultimately positioned within the superior aspect of the right atrium. Initial aspiration demonstrated excellent flow through the arterial port but limited flow through the venous port. The catheter was manipulated deeper into the right atrium as well as closer toward the cavoatrial junction while aspiration was performed in the various positions. Ultimately, the venous port could not aspirate effectively. The patient has elevation of the right hemidiaphragm which appears to be elongating and narrowing the right atrium. Therefore, the 23 cm hemodialysis catheter was exchanged over 2 stiff glide wires for a 19 cm tip to cuff palindrome tunneled hemodialysis catheter which was position with the tip higher up at the cavoatrial junction. Aspiration through both the arterial and venous ports was adequate. Final catheter positioning was confirmed and documented with a spot radiographic image. The catheter aspirates and flushes normally. The catheter was flushed with appropriate volume heparin dwells. The catheter exit site was secured with a 0-Prolene retention suture. The venotomy incision was closed with Dermabond. Dressings were applied. The patient tolerated the procedure well without immediate post procedural complication. IMPRESSION: Successful placement of 19 cm tip to cuff tunneled hemodialysis catheter via the left internal jugular vein with tips terminating within the superior aspect of the right atrium. The catheter is ready for  immediate use. Of note, it was difficult to find a position for the catheter tip that allowed for good flow through both the venous and arterial ports. Patient may have underlying fibrin sheaths in situ related to the existing right IJ port catheter and perhaps other prior catheter placement. If this new catheter becomes problematic, patient may need either a longer 28 cm catheter through and through the right atrium and into the hepatic IVC, or a tunneled femoral approach device. Electronically Signed   By: Jacqulynn Cadet M.D.   On: 12/22/2020 12:55   IR US Guide Vasc Access Left  Result Date: 12/22/2020 INDICATION: 65 year old male in need of continued hemodialysis. He currently has a right inguinal temporary hemodialysis catheter and presents for placement of a more durable tunneled hemodialysis catheter. EXAM: TUNNELED CENTRAL VENOUS HEMODIALYSIS CATHETER PLACEMENT WITH ULTRASOUND AND FLUOROSCOPIC GUIDANCE MEDICATIONS: 2 g Ancef. The antibiotic was given in an appropriate time interval prior to skin puncture. ANESTHESIA/SEDATION: Moderate (conscious) sedation was employed during this procedure. A total of Versed 1.5 mg and Fentanyl 75 mcg was administered intravenously. Moderate Sedation Time: 33 minutes. The patient's level of consciousness and vital signs were monitored continuously by radiology nursing throughout the procedure under my direct supervision. FLUOROSCOPY TIME:  Fluoroscopy Time: 1 minutes 48 seconds (14 mGy). COMPLICATIONS: None immediate. PROCEDURE: Informed written consent was obtained from the patient after a discussion of the risks, benefits, and alternatives to treatment. Questions regarding the procedure were encouraged and answered. The left neck and chest were prepped with chlorhexidine in a sterile fashion, and a sterile drape was applied covering the operative field. Maximum barrier sterile technique with sterile gowns and gloves were used for the procedure. A timeout was  performed prior to the initiation of the procedure. After  creating a small venotomy incision, a micropuncture kit was utilized to access the left internal jugular vein under direct, real-time ultrasound guidance after the overlying soft tissues were anesthetized with 1% lidocaine with epinephrine. Ultrasound image documentation was performed. The microwire was kinked to measure appropriate catheter length. A stiff Glidewire was advanced to the level of the IVC and the micropuncture sheath was exchanged for a peel-away sheath. A palindrome tunneled hemodialysis catheter measuring 23 cm from tip to cuff was tunneled in a retrograde fashion from the anterior chest wall to the venotomy incision. The catheter was then placed through the peel-away sheath with tips ultimately positioned within the superior aspect of the right atrium. Initial aspiration demonstrated excellent flow through the arterial port but limited flow through the venous port. The catheter was manipulated deeper into the right atrium as well as closer toward the cavoatrial junction while aspiration was performed in the various positions. Ultimately, the venous port could not aspirate effectively. The patient has elevation of the right hemidiaphragm which appears to be elongating and narrowing the right atrium. Therefore, the 23 cm hemodialysis catheter was exchanged over 2 stiff glide wires for a 19 cm tip to cuff palindrome tunneled hemodialysis catheter which was position with the tip higher up at the cavoatrial junction. Aspiration through both the arterial and venous ports was adequate. Final catheter positioning was confirmed and documented with a spot radiographic image. The catheter aspirates and flushes normally. The catheter was flushed with appropriate volume heparin dwells. The catheter exit site was secured with a 0-Prolene retention suture. The venotomy incision was closed with Dermabond. Dressings were applied. The patient tolerated the  procedure well without immediate post procedural complication. IMPRESSION: Successful placement of 19 cm tip to cuff tunneled hemodialysis catheter via the left internal jugular vein with tips terminating within the superior aspect of the right atrium. The catheter is ready for immediate use. Of note, it was difficult to find a position for the catheter tip that allowed for good flow through both the venous and arterial ports. Patient may have underlying fibrin sheaths in situ related to the existing right IJ port catheter and perhaps other prior catheter placement. If this new catheter becomes problematic, patient may need either a longer 28 cm catheter through and through the right atrium and into the hepatic IVC, or a tunneled femoral approach device. Electronically Signed   By: Jacqulynn Cadet M.D.   On: 12/22/2020 12:55    Labs: BMET Recent Labs  Lab 12/18/20 0433 12/19/20 0354 12/20/20 0356 12/21/20 0427 12/22/20 0308  NA 139 139 132* 137 137  K 4.7 4.4 3.5 4.2 4.5  CL 107 109 101 106 110  CO2 19* 16* 19* 20* 17*  GLUCOSE 221* 104* 340* 335* 220*  BUN 89* 106* 49* 58* 70*  CREATININE 3.85* 4.78* 3.33* 4.61* 5.43*  CALCIUM 7.6* 7.5* 7.1* 6.9* 7.1*  PHOS 7.8* 8.6* 4.8*  --  4.9*   CBC Recent Labs  Lab 12/18/20 0433 12/19/20 0354 12/20/20 0356 12/21/20 0427 12/22/20 0308 12/23/20 0543  WBC 15.7*  15.9*   < > 11.9* 11.7* 10.0 9.0  NEUTROABS 12.1*  --   --   --   --   --   HGB 8.0*  8.3*   < > 8.0* 7.3* 7.1* 7.1*  HCT 25.8*  25.6*   < > 24.2* 22.9* 22.5* 21.7*  MCV 91.5  90.1   < > 87.7 91.2 91.8 90.4  PLT 154  144*   < >  172 182 213 184   < > = values in this interval not displayed.    Medications:     atorvastatin  20 mg Per Tube Daily   chlorhexidine gluconate (MEDLINE KIT)  15 mL Mouth Rinse BID   Chlorhexidine Gluconate Cloth  6 each Topical Q1200   clonazepam  1 mg Per Tube TID   etomidate  40 mg Intravenous Once   famotidine  20 mg Per Tube Daily    feeding supplement (PROSource TF)  45 mL Per Tube BID   hydrocerin   Topical BID   insulin aspart  0-15 Units Subcutaneous Q4H   insulin aspart  6 Units Subcutaneous Q4H   insulin detemir  25 Units Subcutaneous BID   mouth rinse  15 mL Mouth Rinse QID   QUEtiapine  25 mg Per Tube BID   thiamine  100 mg Per Tube Daily   Elmarie Shiley, MD 12/23/2020, 6:40 AM

## 2020-12-23 NOTE — Evaluation (Signed)
Physical Therapy Evaluation Patient Details Name: Thomas [name] MRN: [id number] DOB: 04/18/1956 Today's Date: 12/23/2020   History of Present Illness  65 y.o. male present to ED 2/24 with high ostomy output. Patient admitted with ischemic ATN in setting of severe sepsis and multisystem organ failure. Hospital admission complicated by volume overload, aspiration pneumonia, acute respiratory failure, AKI and ischemic colitis. S/p trach placement 8/11. CorTrak 8/12. HD initiated 8/12. To IR 8/15 for tunneled HD cath. 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  Clinical Impression   Pt admitted secondary to problem above with deficits below. PTA patient was living with family and walking with RW.  Pt currently requires +2 mod-max assist for all bed mobility and was limited in EOB activities due to significant orthostasis. Anticipate patient will benefit from PT to address problems listed below.Will continue to follow acutely to maximize functional mobility independence and safety.       Follow Up Recommendations SNF    Equipment Recommendations  Wheelchair (measurements PT);Wheelchair cushion (measurements PT);Hospital bed    Recommendations for Other Services       Precautions / Restrictions Precautions Precautions: Fall Precaution Comments: Orthostatic hypotension; CorTrak; L fem line; ostomy bag Restrictions Weight Bearing Restrictions: No      Mobility  Bed Mobility Overal bed mobility: Needs Assistance Bed Mobility: Rolling;Sidelying to Sit;Sit to Supine Rolling: Mod assist;+2 for physical assistance;+2 for safety/equipment Sidelying to sit: Mod assist;+2 for physical assistance;+2 for safety/equipment   Sit to supine: Max assist;+2 for physical assistance;+2 for  safety/equipment   General bed mobility comments: Mod A +2 to roll onto R side and Mod A +2 at LE and trunk to come to sitting. Max A +2 for return to supine secondary to drop in BP.    Transfers Overall transfer level: Needs assistance               General transfer comment: Deferred secondary to orthostatic hypotension  Ambulation/Gait                Stairs            Wheelchair Mobility    Modified Rankin (Stroke Patients Only)       Balance Overall balance assessment: Needs assistance Sitting-balance support: Bilateral upper extremity supported;Feet supported Sitting balance-Leahy Scale: Poor Sitting balance - Comments: Mod A to maintain static sitting at EOB. Postural control: Right lateral lean                                   Pertinent Vitals/Pain Pain Assessment: Faces Faces Pain Scale: Hurts a little bit Pain Location: Abdomen, head and bilateral hands. Pain Descriptors / Indicators: Sore;Aching;Headache Pain Intervention(s): Limited activity within patient's tolerance;Monitored during session;Repositioned    Home Living Family/patient expects to be discharged to:: Private residence Living Arrangements: Spouse/significant other;Other relatives Available Help at Discharge: Family;Available PRN/intermittently Type of Home: House Home Access: Stairs to enter   Entrance Stairs-Number of Steps: 5 Home Layout: Multi-level;Able to live on main level with bedroom/bathroom Home Equipment: Kasandra Knudsen - single point;Walker - 2 wheels Additional Comments: Most information obtained from med chart. Patient able to confirm with head nods/shakes. Able to mouth some responses.    Prior Function Level of Independence: Independent with assistive device(s)  Comments: independent with ADLs, does not drive since brain tumor resection in 2017. Family assist with transportation, IADLs as needed     Hand Dominance   Dominant Hand:  Right    Extremity/Trunk Assessment   Upper Extremity Assessment Upper Extremity Assessment: Defer to OT evaluation    Lower Extremity Assessment Lower Extremity Assessment: Generalized weakness    Cervical / Trunk Assessment Cervical / Trunk Assessment: Other exceptions Cervical / Trunk Exceptions: Large body habitus  Communication   Communication: Tracheostomy  Cognition Arousal/Alertness: Awake/alert;Lethargic Behavior During Therapy: Flat affect Overall Cognitive Status: No family/caregiver present to determine baseline cognitive functioning                                 General Comments: Patient follows 1-step verbal commands with good accuracy; able to confirm home set-up and PLOF with head nods/shakes. Able to communicate pain in abdomen and head. Internally distracted by pain/discomfort.      General Comments General comments (skin integrity, edema, etc.): Skin peeling on bilateral hands. Edema in BUE LUE>RUE. SpO2 in 90's on 8L via trach collar with FiO2 35%. Patient with postitive orthostatic BP with supine to sit at EOB. BP 117/77 upon sitting EOB with drop to 76/36 after several minutes. Upon return to supine BP 128/86.    Exercises     Assessment/Plan    PT Assessment Patient needs continued PT services  PT Problem List Decreased strength;Decreased activity tolerance;Decreased balance;Decreased mobility;Decreased cognition;Decreased knowledge of use of DME;Cardiopulmonary status limiting activity;Obesity;Pain       PT Treatment Interventions DME instruction;Gait training;Functional mobility training;Stair training;Therapeutic activities;Therapeutic exercise;Balance training;Patient/family education    PT Goals (Current goals can be found in the Care Plan section)  Acute Rehab PT Goals Patient Stated Goal: to decrease pain PT Goal Formulation: With patient Time For Goal Achievement: 01/06/21 Potential to Achieve Goals: Good    Frequency Min  2X/week   Barriers to discharge Inaccessible home environment 5 steps to enter    Co-evaluation PT/OT/SLP Co-Evaluation/Treatment: Yes Reason for Co-Treatment: For patient/therapist safety;To address functional/ADL transfers PT goals addressed during session: Mobility/safety with mobility;Balance;Strengthening/ROM         AM-PAC PT "6 Clicks" Mobility  Outcome Measure Help needed turning from your back to your side while in a flat bed without using bedrails?: Total Help needed moving from lying on your back to sitting on the side of a flat bed without using bedrails?: Total Help needed moving to and from a bed to a chair (including a wheelchair)?: Total Help needed standing up from a chair using your arms (e.g., wheelchair or bedside chair)?: Total Help needed to walk in hospital room?: Total Help needed climbing 3-5 steps with a railing? : Total 6 Click Score: 6    End of Session Equipment Utilized During Treatment: Oxygen Activity Tolerance: Patient limited by fatigue Patient left: in bed;with call bell/phone within reach;with bed alarm set;with restraints reapplied (bil mittens) Nurse Communication: Other (comment) (orthostasis) PT Visit Diagnosis: Muscle weakness (generalized) (M62.81);Difficulty in walking, not elsewhere classified (R26.2)    Time: 5170-0174 PT Time Calculation (min) (ACUTE ONLY): 29 min   Charges:   PT Evaluation $PT Eval Moderate Complexity: 1 Mod           Arby Barrette, PT Pager [phone]   Rexanne Mano 12/23/2020, 11:49 AM

## 2020-12-23 NOTE — Progress Notes (Signed)
Patient was transferred to 5W without incident. Transferred on trach collar with stable vitals. Patient's wife was at bedside and instructed on how to get to 5W. Report given to 5W RN.

## 2020-12-23 NOTE — Evaluation (Signed)
Occupational Therapy Evaluation Patient Details Name: Thomas [name] MRN: [id number] DOB: 1956-01-26 Today's Date: 12/23/2020    History of Present Illness 65 y.o. male present to ED 2/24 with high ostomy output. Patient admitted with ischemic ATN in setting of severe sepsis and multisystem organ failure. Hospital admission complicated by volume overload, aspiration pneumonia, acute respiratory failure, AKI and ischemic colitis. S/p trach placement 8/11. CorTrak 8/12. HD initiated 8/12. To IR 8/15 for tunneled HD cath. 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   Clinical Impression   PTA patient was living with his spouse and other relatives in a private residence with 5 STE and was grossly Mod I with ADLs/IADLs with intermittent use of RW. Patient currently functioning below baseline demonstrating observed ADLs including face washing in supine with Mod A. Patient also requiring Mod to Max A +2 for all parts of bed mobility. Patient also limited by deficits listed below including generalized weakness/debility, decrease cardiopulmonary endurance and  decreased balance and would benefit from continued acute OT services in prep for safe d/c to next level of care.      Follow Up Recommendations  SNF;Supervision/Assistance - 24 hour    Equipment Recommendations  Other (comment) (defer to next level of care)    Recommendations for Other Services       Precautions / Restrictions Precautions Precautions: Fall Precaution Comments: Orthostatic hypotension; CorTrak; L fem line; ostomy bag Restrictions Weight Bearing Restrictions: No      Mobility Bed Mobility Overal bed mobility: Needs Assistance Bed Mobility: Rolling;Sidelying to Sit;Sit to Supine Rolling: Mod assist;+2 for physical  assistance;+2 for safety/equipment Sidelying to sit: Mod assist;+2 for physical assistance;+2 for safety/equipment   Sit to supine: Max assist;+2 for physical assistance;+2 for safety/equipment   General bed mobility comments: Mod A +2 to roll onto R side and Mod A +2 at LE and trunk to come to sitting. Max A +2 for return to supine secondary to drop in BP.    Transfers Overall transfer level: Needs assistance               General transfer comment: Deferred secondary to orthostatic hypotension    Balance Overall balance assessment: Needs assistance Sitting-balance support: Bilateral upper extremity supported;Feet supported Sitting balance-Leahy Scale: Poor Sitting balance - Comments: Mod A to maintain static sitting at EOB. Postural control: Right lateral lean                                 ADL either performed or assessed with clinical judgement   ADL Overall ADL's : Needs assistance/impaired Eating/Feeding: NPO   Grooming: Wash/dry face;Moderate assistance Grooming Details (indicate cue type and reason): Mod A to wash face in supine.             Lower Body Dressing: Maximal assistance;Bed level Lower Body Dressing Details (indicate cue type and reason): Max A to don footwear in supine.               General ADL Comments: Patient greatly limited by pain, generalized weakness and orthostatic hypotension.     Vision   Vision Assessment?: No apparent visual deficits Additional Comments: Able to correctly identify number of therapists fingers presented in central vision.     Perception     Praxis  Pertinent Vitals/Pain Pain Assessment: Faces Faces Pain Scale: Hurts a little bit Pain Location: Abdomen, head and bilateral hands. Pain Descriptors / Indicators: Sore;Aching;Headache Pain Intervention(s): Limited activity within patient's tolerance;Monitored during session;Repositioned     Hand Dominance Right   Extremity/Trunk  Assessment Upper Extremity Assessment Upper Extremity Assessment: Generalized weakness   Lower Extremity Assessment Lower Extremity Assessment: Defer to PT evaluation   Cervical / Trunk Assessment Cervical / Trunk Assessment: Other exceptions Cervical / Trunk Exceptions: Large body habitus   Communication Communication Communication: Tracheostomy   Cognition Arousal/Alertness: Awake/alert;Lethargic Behavior During Therapy: Flat affect Overall Cognitive Status: No family/caregiver present to determine baseline cognitive functioning                                 General Comments: Patient follows 1-step verbal commands with good accuracy; able to confirm home set-up and PLOF with head nods/shakes. Able to communicate pain in abdomen and head. Internally distracted by pain/discomfort.   General Comments  Skin peeling on bilateral hands. Edema in BUE LUE>RUE. SpO2 in 90's on 8L via trach collar with FiO2 35%. Patient with postitive orthostatic BP with supine to sit at EOB. BP 117/77 upon sitting EOB with drop to 76/36 after several minutes. Upon return to supine BP 128/86.    Exercises     Shoulder Instructions      Home Living Family/patient expects to be discharged to:: Private residence Living Arrangements: Spouse/significant other;Other relatives Available Help at Discharge: Family;Available PRN/intermittently Type of Home: House Home Access: Stairs to enter     Home Layout: Multi-level;Able to live on main level with bedroom/bathroom     Bathroom Shower/Tub: Teacher, early years/pre: Standard     Home Equipment: Cane - single point;Walker - 2 wheels   Additional Comments: Most information obtained from med chart. Patient able to confirm with head nods/shakes. Able to mouth some responses.      Prior Functioning/Environment Level of Independence: Independent with assistive device(s)        Comments: independent with ADLs, does not drive  since brain tumor resection in 2017. Family assist with transportation, IADLs as needed        OT Problem List: Decreased strength;Decreased range of motion;Decreased activity tolerance;Impaired balance (sitting and/or standing);Decreased safety awareness;Decreased knowledge of use of DME or AE;Cardiopulmonary status limiting activity;Pain;Increased edema      OT Treatment/Interventions: Self-care/ADL training;Therapeutic exercise;DME and/or AE instruction;Energy conservation;Therapeutic activities;Patient/family education;Balance training    OT Goals(Current goals can be found in the care plan section) Acute Rehab OT Goals Patient Stated Goal: to decrease pain OT Goal Formulation: With patient Time For Goal Achievement: 01/06/21 Potential to Achieve Goals: Good ADL Goals Pt Will Perform Grooming: standing;with supervision Pt Will Perform Upper Body Dressing: with set-up;sitting Pt Will Perform Lower Body Dressing: with supervision;sit to/from stand Pt Will Transfer to Toilet: with supervision;ambulating;bedside commode Pt Will Perform Toileting - Clothing Manipulation and hygiene: with supervision;sit to/from stand Pt Will Perform Tub/Shower Transfer: Tub transfer;Shower transfer;3 in Energy manager will Perform Home Exercise Program: Increased ROM;Increased strength;Both right and left upper extremity;Independently Additional ADL Goal #1: Patient will maintain static sitting balance at EOB for 15 min with supervision A in prep for ADLs.  OT Frequency: Min 2X/week   Barriers to D/C: Inaccessible home environment  5 STE home       Co-evaluation              AM-PAC  OT "6 Clicks" Daily Activity     Outcome Measure Help from another person eating meals?: Total (cortrak) Help from another person taking care of personal grooming?: A Lot Help from another person toileting, which includes using toliet, bedpan, or urinal?: Total Help from another person bathing  (including washing, rinsing, drying)?: Total Help from another person to put on and taking off regular upper body clothing?: A Lot Help from another person to put on and taking off regular lower body clothing?: Total 6 Click Score: 8   End of Session Equipment Utilized During Treatment: Oxygen (via trach collar) Nurse Communication: Mobility status (Othrothstatic BP)  Activity Tolerance: Patient tolerated treatment well;Treatment limited secondary to medical complications (Comment) (orthostatic BP) Patient left: in bed;with call bell/phone within reach;with bed alarm set  OT Visit Diagnosis: Unsteadiness on feet (R26.81);Muscle weakness (generalized) (M62.81);Pain Pain - part of body:  (abdomen)                Time: 4098-1191 OT Time Calculation (min): 30 min Charges:  OT General Charges $OT Visit: 1 Visit OT Evaluation $OT Eval Moderate Complexity: 1 Mod OT Treatments $Self Care/Home Management : 8-22 mins  Momoko Slezak H. OTR/L Supplemental OT, Department of rehab services [phone]  Taeja Debellis R H. 12/23/2020, 11:37 AM

## 2020-12-24 LAB — CBC
HCT: 22.1 % — ABNORMAL LOW (ref 39.0–52.0)
Hemoglobin: 6.9 g/dL — CL (ref 13.0–17.0)
MCH: 28.9 pg (ref 26.0–34.0)
MCHC: 31.2 g/dL (ref 30.0–36.0)
MCV: 92.5 fL (ref 80.0–100.0)
Platelets: 203 10*3/uL (ref 150–400)
RBC: 2.39 MIL/uL — ABNORMAL LOW (ref 4.22–5.81)
RDW: 20.1 % — ABNORMAL HIGH (ref 11.5–15.5)
WBC: 10.9 10*3/uL — ABNORMAL HIGH (ref 4.0–10.5)
nRBC: 0 % (ref 0.0–0.2)

## 2020-12-24 LAB — RENAL FUNCTION PANEL
Albumin: 1.7 g/dL — ABNORMAL LOW (ref 3.5–5.0)
Anion gap: 11 (ref 5–15)
BUN: 49 mg/dL — ABNORMAL HIGH (ref 8–23)
CO2: 21 mmol/L — ABNORMAL LOW (ref 22–32)
Calcium: 8 mg/dL — ABNORMAL LOW (ref 8.9–10.3)
Chloride: 108 mmol/L (ref 98–111)
Creatinine, Ser: 5.02 mg/dL — ABNORMAL HIGH (ref 0.61–1.24)
GFR, Estimated: 12 mL/min — ABNORMAL LOW (ref >60)
Glucose, Bld: 108 mg/dL — ABNORMAL HIGH (ref 70–99)
Phosphorus: 4.3 mg/dL (ref 2.5–4.6)
Potassium: 3.8 mmol/L (ref 3.5–5.1)
Sodium: 140 mmol/L (ref 135–145)

## 2020-12-24 LAB — GLUCOSE, CAPILLARY
Glucose-Capillary: 136 mg/dL — ABNORMAL HIGH (ref 70–99)
Glucose-Capillary: 101 mg/dL — ABNORMAL HIGH (ref 70–99)
Glucose-Capillary: 120 mg/dL — ABNORMAL HIGH (ref 70–99)
Glucose-Capillary: 124 mg/dL — ABNORMAL HIGH (ref 70–99)
Glucose-Capillary: 124 mg/dL — ABNORMAL HIGH (ref 70–99)
Glucose-Capillary: 116 mg/dL — ABNORMAL HIGH (ref 70–99)

## 2020-12-24 LAB — HEMOGLOBIN AND HEMATOCRIT, BLOOD
HCT: 24.2 % — ABNORMAL LOW (ref 39.0–52.0)
Hemoglobin: 7.9 g/dL — ABNORMAL LOW (ref 13.0–17.0)

## 2020-12-24 LAB — PREPARE RBC (CROSSMATCH)

## 2020-12-24 MED ORDER — CEFEPIME HCL 1 G IJ SOLR
1.0000 g | INTRAMUSCULAR | Status: AC
  Administered 2020-12-24 – 2020-12-28 (×5): 1 g via INTRAVENOUS
  Filled 2020-12-24 (×5): qty 1

## 2020-12-24 MED ORDER — GLYCOPYRROLATE 1 MG PO TABS
1.0000 mg | ORAL_TABLET | Freq: Two times a day (BID) | ORAL | Status: DC
  Administered 2020-12-24: 1 mg via ORAL
  Filled 2020-12-24 (×2): qty 1

## 2020-12-24 MED ORDER — SODIUM CHLORIDE 0.9% IV SOLUTION: Freq: Once | INTRAVENOUS | Status: AC

## 2020-12-24 NOTE — Progress Notes (Signed)
   12/24/20 1200  Assess: MEWS Score  Temp 98.5 F (36.9 C)  BP (!) 100/57  Pulse Rate 90  ECG Heart Rate 90  Resp (!) 23  Level of Consciousness Alert  Assess: MEWS Score  MEWS Temp 0  MEWS Systolic 1  MEWS Pulse 0  MEWS RR 1  MEWS LOC 0  MEWS Score 2  MEWS Score Color Yellow  Assess: if the MEWS score is Yellow or Red  Were vital signs taken at a resting state? Yes  Focused Assessment No change from prior assessment  Early Detection of Sepsis Score *See Row Information* Low  MEWS guidelines implemented *See Row Information* Yes  Treat  MEWS Interventions Administered scheduled meds/treatments  Pain Scale 0-10  Pain Score Asleep  Neuro symptoms relieved by Rest  Take Vital Signs  Increase Vital Sign Frequency  Yellow: Q 2hr X 2 then Q 4hr X 2, if remains yellow, continue Q 4hrs  Escalate  MEWS: Escalate Yellow: discuss with charge nurse/RN and consider discussing with provider and RRT  Notify: Charge Nurse/RN  Name of Charge Nurse/RN Notified Mckenzie, RN  Date Charge Nurse/RN Notified 12/24/20  Time Charge Nurse/RN Notified 1235  Assess: SIRS CRITERIA  SIRS Temperature  0  SIRS Pulse 0  SIRS Respirations  1  SIRS WBC 0  SIRS Score Sum  1

## 2020-12-24 NOTE — Progress Notes (Signed)
eLink Physician-Brief Progress Note Patient Name: Thomas [name] DOB: [birth year] MRN: [id number]   Date of Service  12/24/2020  HPI/Events of Note  Hemoglobin 6.9 gm / dl.  eICU Interventions  One unit PRBC ordered transfused.        Kerry Kass [name] 12/24/2020, 6:31 AM

## 2020-12-24 NOTE — Significant Event (Signed)
Patient with hemoglobin of 6.9. E-link critical care physician notified. Physician name was Kathlee Nations. Notified at 6:10 am. No current type and screen.

## 2020-12-24 NOTE — Progress Notes (Signed)
Pharmacy Antibiotic Note  Thomas [name] is a 65 y.o. male admitted on 11/30/2020 with SIRS. He has had complicated hospital course, including prolonged stay in ICU and AKI requiring intermittent HD. Pt failed extubation and required trach placement on 12/18/20. Pt was on ceftriaxone for CAP, but trach aspirate cx now with rare Pseudomonas aeruginosa, suscept pending. Pharmacy has been consulted for cefepime dosing.  WBC 10.9, afebrile; Scr 5.02 (admission Scr 2.1), L IJ TDC placed on 8/15 for ongoing HD (planning HD today, but HD RN said pt not on schedule for today)  Plan: Cefepime 1 gm IV Q 24 hrs (give dose daily; on HD days, give dose on nursing unit after pt returns from HD) Monitor WBC, temp, clinical improvement, cultures F/U HD schedule  Height: 5\' 8"  (172.7 cm) Weight: 107.4 kg (236 lb 12.4 oz) IBW/kg (Calculated) : 68.4  Temp (24hrs), Avg:99.1 F (37.3 C), Min:98.5 F (36.9 C), Max:99.8 F (37.7 C)  Recent Labs  Lab 12/20/20 0356 12/21/20 0427 12/22/20 0308 12/23/20 0543 12/23/20 1026 12/24/20 0435  WBC 11.9* 11.7* 10.0 9.0  --  10.9*  CREATININE 3.33* 4.61* 5.43*  --  3.90* 5.02*    Estimated Creatinine Clearance: 17.7 mL/min (A) (by C-G formula based on SCr of 5.02 mg/dL (H)).    No Known Allergies  Antimicrobials this admission: Unasyn 7/30  >> 7/31 Maxepime 7/31 >> 8/7 Flagyl 7/31 >>  8/7 Vancomycin 8/1 only  Anidulafungin 8/1 >> 8/3 Ceftriaxone 8/16 >> 8/17 Cefepime 8/17 >>  Recent microbiology results: 8/15 Trach aspirate: rare Pseudomonas aeruginosa, suscept pending  Thank you for allowing pharmacy to be a part of this patient's care.  Gillermina Hu, PharmD, BCPS, [facility] Clinical Pharmacist 12/24/2020 4:26 PM

## 2020-12-24 NOTE — Progress Notes (Addendum)
PROGRESS NOTE        PATIENT DETAILS Name: Thomas [name] Age: 65 y.o. Sex: male Date of Birth: 10-30-1955 Admit Date: 11/30/2020 Admitting Physician Renee Pain, MD GYF:VCBSW, Rachel Moulds, NP  Brief Narrative: Patient is a 65 y.o. male with history of colon cancer-s/p partial transverse colectomy/colostomy 09/10/2020-recently started on chemotherapy prior to this hospitalization-who presented to the hospital on 7/24 with abdominal pain, anorexia, weakness-he was thought to have sepsis syndrome-and subsequently started on antimicrobial therapy.  He was initially admitted to the intensive care unit-but stabilized and transferred to the [facility] on 7/27, unfortunately further hospital course was complicated by altered mental status/delirium-septic shock/acute hypoxic respiratory failure in the setting of aspiration pneumonia.  He was transferred to the ICU where he was intubated and did require pressor support.He had a prolonged stay in the ICU-complicated by development of AKI due to ATN requiring initiation of renal replacement therapy.  He eventually failed extubation and required tracheostomy placement.   He was transferred back to the [facility] on 8/17.   Significant events: 7/25>> SIRS-admit to ICU 7/27>> Transferred to [place], stopped abx as sepsis resolved/ruled out 7/30>> worsening hypoxia/new aspiration-worsening renal function-transfer to ICU-intubated  8/08>>failed extubation 8/8-reintubated 8/11>> tracheostomy placement 8/17>> transferred to [facility]  Significant studies: 7/26>> Echo: EF 96-75%, grade 1 diastolic dysfunction 9/16>> CT head: Negative for bleed/acute intracranial process. 7/29>> EEG: No seizures. 8/01>> CT abdomen/pelvis: Inflammatory wall thickening/fat stranding around descending/sigmoid colon.  Airspace opacity in the right lower lobe/right middle lobe. 8/05>> bilateral lower extremity Doppler: DVT right femoral  vein, right proximal profunda vein, right popliteal vein  Antimicrobial therapy: Unasyn 7/30 >>7/31 Maxepime 7/31 >> 8/7 Flagyl 7/31 >>  8/7 Vancomycin 8/1 only  Anidulafungin 8/1>>8/3 Rocephin: 8/16>>8/17 Cefepime:8/17>>  Microbiology data: 7/24>> blood cultures: Negative 7/25>> urine culture: Negative 7/26>> C. difficile studies: Negative 7/31>> respiratory culture: Moderate Candida tropicalis. 8/01>> GI pathogen panel: Negative 8/15>> tracheal aspirate: Pseudomonas aeruginosa   Procedures : 7/30>> 8/11: ETT 8/11>> tracheostomy placement 8/15>> left IJ TDC  Consults: PCCM, surgery, oncology, nephrology, palliative care  DVT Prophylaxis : apixaban (ELIQUIS) tablet 5 mg    Subjective: Appears very chronically sick appearing-debilitated/deconditioned/disheveled.  NG tube in place.  Unable to vocalize-but appears to be speaking very softly-complains of some mild left abdominal pain.  Assessment/Plan: Septic shock due to aspiration pneumonia: Sepsis physiology has resolved-most recent tracheal aspirate cultures on 8/15 positive for Pseudomonas-was started on Rocephin a few days back-we will switch to cefepime.    Acute hypoxic respiratory failure due to aspiration pneumonia: Failed extubation attempt-underwent tracheostomy on 8/11.  Currently stable on trach collar.  Dysphagia: In the setting of acute illness-respiratory failure-tracheostomy placement-SLP following-NG tube in place-tolerating tube feedings well.  Acute metabolic encephalopathy: Due to septic shock-hypoxemia-slowly improving-CT head negative for acute intracranial process.  Continue Seroquel and Klonopin.  AKI on CKD stage IIIb: AKI felt to be hemodynamically mediated-likely ischemic ATN-initially on CRRT while in the ICU-transition to intermittent HD.  No evidence of renal recovery as of now-nephrology following and directing intermittent HD.  Right lower extremity DVT: On Eliquis.  PAF with RVR: Provoked  by acute illness/sepsis-maintaining sinus rhythm.  Echo with stable EF.  On Eliquis for DVT.  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 Benay Spice.  Anemia  of critical illness: Hemoglobin down to 6.9-has been ordered 1 unit of PRBC.  No overt GI loss.  Repeat CBC tomorrow morning.  Seizure disorder: Continue Keppra-EEG negative.  History of frontal meningioma-s/p resection 09/01/2016  HLD: Continue statin  DM-2 (A1c 8.7 on 7/25): CBG stable-continue 18 units of Levemir twice daily, 6 units of NovoLog every 4 hours and SSI.  Follow and adjust  Recent Labs    12/24/20 0834 12/24/20 1142 12/24/20 1604  GLUCAP 120* 124* 124*    History of diabetic neuropathy: Resume Neurontin when able.  GERD: Continue PPI  Capecitabine induced desquamation/Hand/foot syndrome:Continue moisturizer to hands and feet and scrotum  Severe debility/deconditioning/dysphagia: Due to acute illness-continue PT/OT/SLP follow-up-likely will require SNF on discharge.  Nutrition Status: Nutrition Problem: Moderate Malnutrition Etiology: chronic illness, cancer and cancer related treatments Signs/Symptoms: mild fat depletion, mild muscle depletion, percent weight loss Percent weight loss: 11 % Interventions: Tube feeding, Prostat  Pressure injury: Pressure Injury 12/01/20 Coccyx Mid Stage 2 -  Partial thickness loss of dermis presenting as a shallow open injury with a red, pink wound bed without slough. stage 2 decubitus to coxxyx (Active)  12/01/20 0900  Location: Coccyx  Location Orientation: Mid  Staging: Stage 2 -  Partial thickness loss of dermis presenting as a shallow open injury with a red, pink wound bed without slough.  Wound Description (Comments): stage 2 decubitus to coxxyx  Present on Admission: Yes     Pressure Injury 12/14/20 Face Upper;Mid Deep Tissue  Pressure Injury - Purple or maroon localized area of discolored intact skin or blood-filled blister due to damage of underlying soft tissue from pressure and/or shear. circular shape broken area & sloug (Active)  12/14/20 0100  Location: Face  Location Orientation: Upper;Mid  Staging: Deep Tissue Pressure Injury - Purple or maroon localized area of discolored intact skin or blood-filled blister due to damage of underlying soft tissue from pressure and/or shear.  Wound Description (Comments): circular shape broken area & sloughing skin  Present on Admission: No     Pressure Injury 12/14/20 Face Left;Upper Deep Tissue Pressure Injury - Purple or maroon localized area of discolored intact skin or blood-filled blister due to damage of underlying soft tissue from pressure and/or shear. circular shape broken area & slou (Active)  12/14/20 0100  Location: Face  Location Orientation: Left;Upper  Staging: Deep Tissue Pressure Injury - Purple or maroon localized area of discolored intact skin or blood-filled blister due to damage of underlying soft tissue from pressure and/or shear.  Wound Description (Comments): circular shape broken area & sloughing skin  Present on Admission: No     Pressure Injury 12/14/20 Back Right Stage 2 -  Partial thickness loss of dermis presenting as a shallow open injury with a red, pink wound bed without slough. linear bleeding broken skin & sloughing skin (Active)  12/14/20 0100  Location: Back  Location Orientation: Right  Staging: Stage 2 -  Partial thickness loss of dermis presenting as a shallow open injury with a red, pink wound bed without slough.  Wound Description (Comments): linear bleeding broken skin & sloughing skin  Present on Admission: No   Obesity: Estimated body mass index is 36 kg/m as calculated from the following:   Height as of this encounter: 5' 8"  (1.727 m).   Weight as of this encounter: 107.4 kg.     Diet: Diet Order             Diet  NPO time specified  Diet effective midnight  Code Status: Full code   Family Communication: None at bedside  Disposition Plan: Status is: Inpatient  Remains inpatient appropriate because:Inpatient level of care appropriate due to severity of illness  Dispo: The patient is from: Home              Anticipated d/c is to: SNF              Patient currently is not medically stable to d/c.   Difficult to place patient No   Barriers to Discharge: N.p.o.-NG tube in place-resolving pneumonia-on IV cefepime-severely deconditioned-not yet stable for discharge.  Antimicrobial agents: Anti-infectives (From admission, onward)    Start     Dose/Rate Route Frequency Ordered Stop   12/23/20 0845  cefTRIAXone (ROCEPHIN) 2 g in sodium chloride 0.9 % 100 mL IVPB        2 g 200 mL/hr over 30 Minutes Intravenous Every 24 hours 12/23/20 0800 12/28/20 0859   12/22/20 0600  ceFAZolin (ANCEF) IVPB 2g/100 mL premix        2 g 200 mL/hr over 30 Minutes Intravenous To Radiology 12/21/20 1503 12/22/20 1149   12/09/20 1800  vancomycin (VANCOREADY) IVPB 1750 mg/350 mL  Status:  Discontinued        1,750 mg 175 mL/hr over 120 Minutes Intravenous Every 24 hours 12/08/20 1357 12/09/20 1403   12/09/20 1700  anidulafungin (ERAXIS) 100 mg in sodium chloride 0.9 % 100 mL IVPB  Status:  Discontinued        100 mg 78 mL/hr over 100 Minutes Intravenous Every 24 hours 12/08/20 1359 12/09/20 1321   12/08/20 1500  vancomycin (VANCOCIN) 2,500 mg in sodium chloride 0.9 % 500 mL IVPB        2,500 mg 250 mL/hr over 120 Minutes Intravenous  Once 12/08/20 1357 12/08/20 1719   12/08/20 1500  anidulafungin (ERAXIS) 200 mg in sodium chloride 0.9 % 200 mL IVPB        200 mg 78 mL/hr over 200 Minutes Intravenous  Once 12/08/20 1359 12/08/20 1821   12/08/20 0000  ceFEPIme (MAXIPIME) 2 g in sodium chloride 0.9 % 100 mL IVPB  Status:  Discontinued        2 g 200 mL/hr over 30 Minutes Intravenous Every 12  hours 12/07/20 1200 12/14/20 0858   12/07/20 1000  metroNIDAZOLE (FLAGYL) IVPB 500 mg  Status:  Discontinued        500 mg 100 mL/hr over 60 Minutes Intravenous Every 12 hours 12/07/20 0849 12/14/20 0858   12/07/20 0900  ceFEPIme (MAXIPIME) 2 g in sodium chloride 0.9 % 100 mL IVPB  Status:  Discontinued        2 g 200 mL/hr over 30 Minutes Intravenous Every 24 hours 12/07/20 0809 12/07/20 1200   12/07/20 0830  metroNIDAZOLE (FLAGYL) IVPB 500 mg  Status:  Discontinued        500 mg 100 mL/hr over 60 Minutes Intravenous Every 8 hours 12/07/20 0758 12/07/20 0849   12/06/20 0900  Ampicillin-Sulbactam (UNASYN) 3 g in sodium chloride 0.9 % 100 mL IVPB  Status:  Discontinued        3 g 200 mL/hr over 30 Minutes Intravenous Every 12 hours 12/06/20 0832 12/07/20 0757   12/03/20 0800  ceFEPIme (MAXIPIME) 2 g in sodium chloride 0.9 % 100 mL IVPB  Status:  Discontinued        2 g 200 mL/hr over 30 Minutes Intravenous Every 8 hours 12/03/20 0746 12/04/20 1004   12/01/20 1000  ceFEPIme (MAXIPIME)  2 g in sodium chloride 0.9 % 100 mL IVPB  Status:  Discontinued        2 g 200 mL/hr over 30 Minutes Intravenous Every 12 hours 12/01/20 0513 12/03/20 0746   12/01/20 0300  vancomycin (VANCOREADY) IVPB 2000 mg/400 mL        2,000 mg 200 mL/hr over 120 Minutes Intravenous  Once 12/01/20 0255 12/01/20 0528   11/30/20 2245  ceFEPIme (MAXIPIME) 2 g in sodium chloride 0.9 % 100 mL IVPB        2 g 200 mL/hr over 30 Minutes Intravenous STAT 11/30/20 2237 12/01/20 0003        Time spent: 45 minutes-Greater than 50% of this time was spent in counseling, explanation of diagnosis, planning of further management, and coordination of care.  MEDICATIONS: Scheduled Meds:  apixaban  5 mg Per Tube BID   atorvastatin  20 mg Per Tube Daily   chlorhexidine gluconate (MEDLINE KIT)  15 mL Mouth Rinse BID   Chlorhexidine Gluconate Cloth  6 each Topical Q1200   clonazepam  1 mg Per Tube TID   feeding supplement (PROSource  TF)  45 mL Per Tube BID   fiber  1 packet Per Tube BID   hydrocerin   Topical BID   insulin aspart  0-15 Units Subcutaneous Q4H   insulin aspart  6 Units Subcutaneous Q4H   insulin detemir  18 Units Subcutaneous BID   mouth rinse  15 mL Mouth Rinse QID   pantoprazole sodium  40 mg Per Tube Daily   QUEtiapine  50 mg Per Tube BID   thiamine  100 mg Per Tube Daily   Continuous Infusions:  sodium chloride Stopped (12/20/20 0955)   cefTRIAXone (ROCEPHIN)  IV 2 g (12/24/20 0848)   feeding supplement (VITAL 1.5 CAL) 1,000 mL (12/23/20 1934)   levETIRAcetam 500 mg (12/24/20 0921)   PRN Meds:.sodium chloride, acetaminophen, albuterol, dextrose, docusate, fentaNYL (SUBLIMAZE) injection, sodium chloride flush   PHYSICAL EXAM: Vital signs: Vitals:   12/24/20 1210 12/24/20 1400 12/24/20 1428 12/24/20 1517  BP: (!) 100/57 (!) 105/58 (!) 104/55 (!) 104/55  Pulse: 88 81 82 86  Resp: (!) 28 (!) 27 (!) 26 (!) 31  Temp:  98.7 F (37.1 C) 98.7 F (37.1 C)   TempSrc:  Axillary Axillary   SpO2: 95% 98%  94%  Weight:      Height:       Filed Weights   12/22/20 2135 12/23/20 0500 12/24/20 0500  Weight: 107.5 kg 105.5 kg 107.4 kg   Body mass index is 36 kg/m.   Gen Exam:Alert awake-not in any distress.  Chronically sick appearing-very disheveled. HEENT:atraumatic, normocephalic Chest: B/L clear to auscultation anteriorly CVS:S1S2 regular Abdomen:soft non tender, non distended.  Colostomy in place. Extremities:no edema Neurology: Non focal-appears to have severe generalized weakness Skin: no rash  I have personally reviewed following labs and imaging studies  LABORATORY DATA: CBC: Recent Labs  Lab 12/18/20 0433 12/19/20 0354 12/20/20 0356 12/21/20 0427 12/22/20 0308 12/23/20 0543 12/24/20 0435  WBC 15.7*  15.9*   < > 11.9* 11.7* 10.0 9.0 10.9*  NEUTROABS 12.1*  --   --   --   --   --   --   HGB 8.0*  8.3*   < > 8.0* 7.3* 7.1* 7.1* 6.9*  HCT 25.8*  25.6*   < > 24.2* 22.9*  22.5* 21.7* 22.1*  MCV 91.5  90.1   < > 87.7 91.2 91.8 90.4 92.5  PLT 154  144*   < > 172 182 213 184 203   < > = values in this interval not displayed.    Basic Metabolic Panel: Recent Labs  Lab 12/18/20 0433 12/19/20 0354 12/20/20 0356 12/21/20 0427 12/22/20 0308 12/23/20 1026 12/24/20 0435  NA 139 139 132* 137 137 138 140  K 4.7 4.4 3.5 4.2 4.5 3.8 3.8  CL 107 109 101 106 110 103 108  CO2 19* 16* 19* 20* 17* 23 21*  GLUCOSE 221* 104* 340* 335* 220* 136* 108*  BUN 89* 106* 49* 58* 70* 37* 49*  CREATININE 3.85* 4.78* 3.33* 4.61* 5.43* 3.90* 5.02*  CALCIUM 7.6* 7.5* 7.1* 6.9* 7.1* 7.9* 8.0*  MG 2.2  --   --   --   --   --   --   PHOS 7.8* 8.6* 4.8*  --  4.9* 3.4 4.3    GFR: Estimated Creatinine Clearance: 17.7 mL/min (A) (by C-G formula based on SCr of 5.02 mg/dL (H)).  Liver Function Tests: Recent Labs  Lab 12/20/20 0356 12/21/20 0427 12/22/20 0308 12/23/20 1026 12/24/20 0435  AST  --  16  --   --   --   ALT  --  22  --   --   --   ALKPHOS  --  72  --   --   --   BILITOT  --  1.0  --   --   --   PROT  --  5.6*  --   --   --   ALBUMIN 1.7* 1.5* 1.6* 1.7* 1.7*   No results for input(s): LIPASE, AMYLASE in the last 168 hours. No results for input(s): AMMONIA in the last 168 hours.  Coagulation Profile: No results for input(s): INR, PROTIME in the last 168 hours.  Cardiac Enzymes: No results for input(s): CKTOTAL, CKMB, CKMBINDEX, TROPONINI in the last 168 hours.  BNP (last 3 results) No results for input(s): PROBNP in the last 8760 hours.  Lipid Profile: No results for input(s): CHOL, HDL, LDLCALC, TRIG, CHOLHDL, LDLDIRECT in the last 72 hours.  Thyroid Function Tests: No results for input(s): TSH, T4TOTAL, FREET4, T3FREE, THYROIDAB in the last 72 hours.  Anemia Panel: No results for input(s): VITAMINB12, FOLATE, FERRITIN, TIBC, IRON, RETICCTPCT in the last 72 hours.  Urine analysis:    Component Value Date/Time   COLORURINE YELLOW 12/01/2020 0200    APPEARANCEUR CLEAR 12/01/2020 0200   LABSPEC 1.023 12/01/2020 0200   PHURINE 5.0 12/01/2020 0200   GLUCOSEU >=500 (A) 12/01/2020 0200   HGBUR NEGATIVE 12/01/2020 0200   BILIRUBINUR NEGATIVE 12/01/2020 0200   KETONESUR NEGATIVE 12/01/2020 0200   PROTEINUR 100 (A) 12/01/2020 0200   UROBILINOGEN 0.2 09/27/2014 0301   NITRITE NEGATIVE 12/01/2020 0200   LEUKOCYTESUR NEGATIVE 12/01/2020 0200    Sepsis Labs: Lactic Acid, Venous    Component Value Date/Time   LATICACIDVEN 2.1 ([place]) 12/10/2020 0412    MICROBIOLOGY: Recent Results (from the past 240 hour(s))  Culture, Respiratory w Gram Stain     Status: None (Preliminary result)   Collection Time: 12/22/20 10:13 AM   Specimen: Tracheal Aspirate; Respiratory  Result Value Ref Range Status   Specimen Description TRACHEAL ASPIRATE  Final   Special Requests NONE  Final   Gram Stain   Final    FEW SQUAMOUS EPITHELIAL CELLS PRESENT FEW WBC SEEN ABUNDANT GRAM POSITIVE COCCI FEW GRAM VARIABLE ROD    Culture   Final    RARE PSEUDOMONAS AERUGINOSA SUSCEPTIBILITIES TO FOLLOW Performed at [facility]  [facility], [place] [address] [place], [place] [address]    Report Status PENDING  Incomplete    RADIOLOGY STUDIES/RESULTS: DG Abd Portable 1V  Result Date: 12/23/2020 CLINICAL DATA:  Abdominal distension EXAM: PORTABLE ABDOMEN - 1 VIEW COMPARISON:  Most recent radiograph 12/19/2020. Most recent CT 12/08/2020 FINDINGS: Weighted enteric tube in place with tip in the right upper quadrant in the region of the distal stomach or proximal duodenum. There is a generalized paucity of bowel gas throughout the abdomen and pelvis. No gaseous bowel distension. Left femoral catheter is in place. No evidence of free air in the supine views. No radiopaque calculi. IMPRESSION: 1. Generalized paucity of bowel gas, nonspecific. There is no gaseous bowel distension. 2. Weighted enteric tube in place with tip in the right upper quadrant in the region of the distal  stomach or proximal duodenum. Electronically Signed   By: Keith Rake M.D.   On: 12/23/2020 15:16     LOS: 23 days   Oren Binet, MD  [facility]    To contact the attending provider between 7A-7P or the covering provider during after hours 7P-7A, please log into the web site [email or web address] and access using universal [place] password for that web site. If you do not have the password, please call the hospital operator.  12/24/2020, 3:54 PM

## 2020-12-24 NOTE — Progress Notes (Signed)
Patient ID: EULOGIO REQUENA, male   DOB: [birth year], 65 y.o.   MRN: [id number] [facility] Progress Note   Assessment/ Plan:   AKI/CKD stage IIIb - ischemic ATN in setting of severe sepsis and multisystem organ failure.  Admission creatinine was 2.1 and he was transiently on CRRT (stopped after 8/9) and transitioned to IHD-underwent left IJ TDC placement on 8/15.  With orders for hemodialysis today; no evidence of renal recovery on labs and appears to be essentially anuric at this time. Altered mental status-with prior history of CVA in 2015 and seizure disorder,.  Volume overload-with continued diarrhea/stool losses and will undertake ultrafiltration with hemodialysis today. Sepsis/aspiration pneumonia -hemodynamic status improved/off pressors and has completed his course of  IV cefepime and vancomycin Chronic hypoxic respiratory failure -status post tracheostomy and ongoing supplementation via T-bar. Stage IIIB colon cancer - sp partial colectomy, colostomy. On chemoRx sp 3 cycles Right leg DVT - anticoagulation per primary team  Atrial fibrillation - s/p amiodarone, dilt; per primary service. Anemia of critical illness: Continue to monitor hemoglobin/hematocrit trend-holding erythropoietin with active malignancy.  Subjective:   Shakes his head "no" when asked if he has any complaints or problems at this time.   Objective:   BP 133/80 (BP Location: Right Wrist)   Pulse 90   Temp 99.1 F (37.3 C) (Axillary)   Resp (!) 25   Ht 5' 8"  (1.727 m)   Wt 107.4 kg   SpO2 98%   BMI 36.00 kg/m   Intake/Output Summary (Last 24 hours) at 12/24/2020 7628 Last data filed at 12/23/2020 1700 Gross per 24 hour  Intake 789.25 ml  Output 650 ml  Net 139.25 ml   Weight change: -0.1 kg  Physical Exam: Gen: Somnolent, comfortable, on T-bar/oxygen supplementation via tracheostomy CVS: Pulse regular rhythm, normal rate, S1 and S2 normal Resp: Anteriorly clear to auscultation, no  rales/rhonchi.  Left IJ TDC in situ Abd: Soft, nontender, ostomy in place Ext: 2+ bilateral lower extremity edema, 1-2+ upper extremity edema.  Imaging: IR Fluoro Guide CV Line Left  Result Date: 12/22/2020 INDICATION: 65 year old male in need of continued hemodialysis. He currently has a right inguinal temporary hemodialysis catheter and presents for placement of a more durable tunneled hemodialysis catheter. EXAM: TUNNELED CENTRAL VENOUS HEMODIALYSIS CATHETER PLACEMENT WITH ULTRASOUND AND FLUOROSCOPIC GUIDANCE MEDICATIONS: 2 g Ancef. The antibiotic was given in an appropriate time interval prior to skin puncture. ANESTHESIA/SEDATION: Moderate (conscious) sedation was employed during this procedure. A total of Versed 1.5 mg and Fentanyl 75 mcg was administered intravenously. Moderate Sedation Time: 33 minutes. The patient's level of consciousness and vital signs were monitored continuously by radiology nursing throughout the procedure under my direct supervision. FLUOROSCOPY TIME:  Fluoroscopy Time: 1 minutes 48 seconds (14 mGy). COMPLICATIONS: None immediate. PROCEDURE: Informed written consent was obtained from the patient after a discussion of the risks, benefits, and alternatives to treatment. Questions regarding the procedure were encouraged and answered. The left neck and chest were prepped with chlorhexidine in a sterile fashion, and a sterile drape was applied covering the operative field. Maximum barrier sterile technique with sterile gowns and gloves were used for the procedure. A timeout was performed prior to the initiation of the procedure. After creating a small venotomy incision, a micropuncture kit was utilized to access the left internal jugular vein under direct, real-time ultrasound guidance after the overlying soft tissues were anesthetized with 1% lidocaine with epinephrine. Ultrasound image documentation was performed. The microwire was kinked to measure appropriate catheter  length. A  stiff Glidewire was advanced to the level of the IVC and the micropuncture sheath was exchanged for a peel-away sheath. A palindrome tunneled hemodialysis catheter measuring 23 cm from tip to cuff was tunneled in a retrograde fashion from the anterior chest wall to the venotomy incision. The catheter was then placed through the peel-away sheath with tips ultimately positioned within the superior aspect of the right atrium. Initial aspiration demonstrated excellent flow through the arterial port but limited flow through the venous port. The catheter was manipulated deeper into the right atrium as well as closer toward the cavoatrial junction while aspiration was performed in the various positions. Ultimately, the venous port could not aspirate effectively. The patient has elevation of the right hemidiaphragm which appears to be elongating and narrowing the right atrium. Therefore, the 23 cm hemodialysis catheter was exchanged over 2 stiff glide wires for a 19 cm tip to cuff palindrome tunneled hemodialysis catheter which was position with the tip higher up at the cavoatrial junction. Aspiration through both the arterial and venous ports was adequate. Final catheter positioning was confirmed and documented with a spot radiographic image. The catheter aspirates and flushes normally. The catheter was flushed with appropriate volume heparin dwells. The catheter exit site was secured with a 0-Prolene retention suture. The venotomy incision was closed with Dermabond. Dressings were applied. The patient tolerated the procedure well without immediate post procedural complication. IMPRESSION: Successful placement of 19 cm tip to cuff tunneled hemodialysis catheter via the left internal jugular vein with tips terminating within the superior aspect of the right atrium. The catheter is ready for immediate use. Of note, it was difficult to find a position for the catheter tip that allowed for good flow through both the venous and  arterial ports. Patient may have underlying fibrin sheaths in situ related to the existing right IJ port catheter and perhaps other prior catheter placement. If this new catheter becomes problematic, patient may need either a longer 28 cm catheter through and through the right atrium and into the hepatic IVC, or a tunneled femoral approach device. Electronically Signed   By: Jacqulynn Cadet M.D.   On: 12/22/2020 12:55   IR US Guide Vasc Access Left  Result Date: 12/22/2020 INDICATION: 65 year old male in need of continued hemodialysis. He currently has a right inguinal temporary hemodialysis catheter and presents for placement of a more durable tunneled hemodialysis catheter. EXAM: TUNNELED CENTRAL VENOUS HEMODIALYSIS CATHETER PLACEMENT WITH ULTRASOUND AND FLUOROSCOPIC GUIDANCE MEDICATIONS: 2 g Ancef. The antibiotic was given in an appropriate time interval prior to skin puncture. ANESTHESIA/SEDATION: Moderate (conscious) sedation was employed during this procedure. A total of Versed 1.5 mg and Fentanyl 75 mcg was administered intravenously. Moderate Sedation Time: 33 minutes. The patient's level of consciousness and vital signs were monitored continuously by radiology nursing throughout the procedure under my direct supervision. FLUOROSCOPY TIME:  Fluoroscopy Time: 1 minutes 48 seconds (14 mGy). COMPLICATIONS: None immediate. PROCEDURE: Informed written consent was obtained from the patient after a discussion of the risks, benefits, and alternatives to treatment. Questions regarding the procedure were encouraged and answered. The left neck and chest were prepped with chlorhexidine in a sterile fashion, and a sterile drape was applied covering the operative field. Maximum barrier sterile technique with sterile gowns and gloves were used for the procedure. A timeout was performed prior to the initiation of the procedure. After creating a small venotomy incision, a micropuncture kit was utilized to access the  left internal jugular vein under  direct, real-time ultrasound guidance after the overlying soft tissues were anesthetized with 1% lidocaine with epinephrine. Ultrasound image documentation was performed. The microwire was kinked to measure appropriate catheter length. A stiff Glidewire was advanced to the level of the IVC and the micropuncture sheath was exchanged for a peel-away sheath. A palindrome tunneled hemodialysis catheter measuring 23 cm from tip to cuff was tunneled in a retrograde fashion from the anterior chest wall to the venotomy incision. The catheter was then placed through the peel-away sheath with tips ultimately positioned within the superior aspect of the right atrium. Initial aspiration demonstrated excellent flow through the arterial port but limited flow through the venous port. The catheter was manipulated deeper into the right atrium as well as closer toward the cavoatrial junction while aspiration was performed in the various positions. Ultimately, the venous port could not aspirate effectively. The patient has elevation of the right hemidiaphragm which appears to be elongating and narrowing the right atrium. Therefore, the 23 cm hemodialysis catheter was exchanged over 2 stiff glide wires for a 19 cm tip to cuff palindrome tunneled hemodialysis catheter which was position with the tip higher up at the cavoatrial junction. Aspiration through both the arterial and venous ports was adequate. Final catheter positioning was confirmed and documented with a spot radiographic image. The catheter aspirates and flushes normally. The catheter was flushed with appropriate volume heparin dwells. The catheter exit site was secured with a 0-Prolene retention suture. The venotomy incision was closed with Dermabond. Dressings were applied. The patient tolerated the procedure well without immediate post procedural complication. IMPRESSION: Successful placement of 19 cm tip to cuff tunneled hemodialysis  catheter via the left internal jugular vein with tips terminating within the superior aspect of the right atrium. The catheter is ready for immediate use. Of note, it was difficult to find a position for the catheter tip that allowed for good flow through both the venous and arterial ports. Patient may have underlying fibrin sheaths in situ related to the existing right IJ port catheter and perhaps other prior catheter placement. If this new catheter becomes problematic, patient may need either a longer 28 cm catheter through and through the right atrium and into the hepatic IVC, or a tunneled femoral approach device. Electronically Signed   By: Jacqulynn Cadet M.D.   On: 12/22/2020 12:55   DG Abd Portable 1V  Result Date: 12/23/2020 CLINICAL DATA:  Abdominal distension EXAM: PORTABLE ABDOMEN - 1 VIEW COMPARISON:  Most recent radiograph 12/19/2020. Most recent CT 12/08/2020 FINDINGS: Weighted enteric tube in place with tip in the right upper quadrant in the region of the distal stomach or proximal duodenum. There is a generalized paucity of bowel gas throughout the abdomen and pelvis. No gaseous bowel distension. Left femoral catheter is in place. No evidence of free air in the supine views. No radiopaque calculi. IMPRESSION: 1. Generalized paucity of bowel gas, nonspecific. There is no gaseous bowel distension. 2. Weighted enteric tube in place with tip in the right upper quadrant in the region of the distal stomach or proximal duodenum. Electronically Signed   By: Keith Rake M.D.   On: 12/23/2020 15:16    Labs: BMET Recent Labs  Lab 12/18/20 0433 12/19/20 0354 12/20/20 0356 12/21/20 0427 12/22/20 0308 12/23/20 1026 12/24/20 0435  NA 139 139 132* 137 137 138 140  K 4.7 4.4 3.5 4.2 4.5 3.8 3.8  CL 107 109 101 106 110 103 108  CO2 19* 16* 19* 20* 17* 23 21*  GLUCOSE 221* 104* 340* 335* 220* 136* 108*  BUN 89* 106* 49* 58* 70* 37* 49*  CREATININE 3.85* 4.78* 3.33* 4.61* 5.43* 3.90* 5.02*   CALCIUM 7.6* 7.5* 7.1* 6.9* 7.1* 7.9* 8.0*  PHOS 7.8* 8.6* 4.8*  --  4.9* 3.4 4.3   CBC Recent Labs  Lab 12/18/20 0433 12/19/20 0354 12/21/20 0427 12/22/20 0308 12/23/20 0543 12/24/20 0435  WBC 15.7*  15.9*   < > 11.7* 10.0 9.0 10.9*  NEUTROABS 12.1*  --   --   --   --   --   HGB 8.0*  8.3*   < > 7.3* 7.1* 7.1* 6.9*  HCT 25.8*  25.6*   < > 22.9* 22.5* 21.7* 22.1*  MCV 91.5  90.1   < > 91.2 91.8 90.4 92.5  PLT 154  144*   < > 182 213 184 203   < > = values in this interval not displayed.    Medications:     sodium chloride   Intravenous Once   apixaban  5 mg Per Tube BID   atorvastatin  20 mg Per Tube Daily   chlorhexidine gluconate (MEDLINE KIT)  15 mL Mouth Rinse BID   Chlorhexidine Gluconate Cloth  6 each Topical Q1200   clonazepam  1 mg Per Tube TID   feeding supplement (PROSource TF)  45 mL Per Tube BID   fiber  1 packet Per Tube BID   hydrocerin   Topical BID   insulin aspart  0-15 Units Subcutaneous Q4H   insulin aspart  6 Units Subcutaneous Q4H   insulin detemir  18 Units Subcutaneous BID   mouth rinse  15 mL Mouth Rinse QID   pantoprazole sodium  40 mg Per Tube Daily   QUEtiapine  50 mg Per Tube BID   thiamine  100 mg Per Tube Daily   Elmarie Shiley, MD 12/24/2020, 9:52 AM

## 2020-12-25 DIAGNOSIS — E1100 Type 2 diabetes mellitus with hyperosmolarity without nonketotic hyperglycemic-hyperosmolar coma (NKHHC): Secondary | ICD-10-CM | POA: Diagnosis not present

## 2020-12-25 LAB — TYPE AND SCREEN
ABO/RH(D): B POS
Antibody Screen: NEGATIVE
Unit division: 0

## 2020-12-25 LAB — RENAL FUNCTION PANEL
Albumin: 1.6 g/dL — ABNORMAL LOW (ref 3.5–5.0)
Anion gap: 16 — ABNORMAL HIGH (ref 5–15)
BUN: 57 mg/dL — ABNORMAL HIGH (ref 8–23)
CO2: 19 mmol/L — ABNORMAL LOW (ref 22–32)
Calcium: 8.3 mg/dL — ABNORMAL LOW (ref 8.9–10.3)
Chloride: 106 mmol/L (ref 98–111)
Creatinine, Ser: 6.06 mg/dL — ABNORMAL HIGH (ref 0.61–1.24)
GFR, Estimated: 10 mL/min — ABNORMAL LOW (ref >60)
Glucose, Bld: 148 mg/dL — ABNORMAL HIGH (ref 70–99)
Phosphorus: 6.4 mg/dL — ABNORMAL HIGH (ref 2.5–4.6)
Potassium: 4.2 mmol/L (ref 3.5–5.1)
Sodium: 141 mmol/L (ref 135–145)

## 2020-12-25 LAB — CBC
HCT: 23.9 % — ABNORMAL LOW (ref 39.0–52.0)
Hemoglobin: 7.6 g/dL — ABNORMAL LOW (ref 13.0–17.0)
MCH: 29.2 pg (ref 26.0–34.0)
MCHC: 31.8 g/dL (ref 30.0–36.0)
MCV: 91.9 fL (ref 80.0–100.0)
Platelets: 177 10*3/uL (ref 150–400)
RBC: 2.6 MIL/uL — ABNORMAL LOW (ref 4.22–5.81)
RDW: 18.9 % — ABNORMAL HIGH (ref 11.5–15.5)
WBC: 9.6 10*3/uL (ref 4.0–10.5)
nRBC: 0 % (ref 0.0–0.2)

## 2020-12-25 LAB — GLUCOSE, CAPILLARY
Glucose-Capillary: 114 mg/dL — ABNORMAL HIGH (ref 70–99)
Glucose-Capillary: 123 mg/dL — ABNORMAL HIGH (ref 70–99)
Glucose-Capillary: 126 mg/dL — ABNORMAL HIGH (ref 70–99)
Glucose-Capillary: 139 mg/dL — ABNORMAL HIGH (ref 70–99)
Glucose-Capillary: 144 mg/dL — ABNORMAL HIGH (ref 70–99)
Glucose-Capillary: 147 mg/dL — ABNORMAL HIGH (ref 70–99)

## 2020-12-25 LAB — BPAM RBC
Blood Product Expiration Date: 202209032359
ISSUE DATE / TIME: 202208171134
Unit Type and Rh: 7300

## 2020-12-25 MED ORDER — PROSOURCE TF PO LIQD
45.0000 mL | Freq: Three times a day (TID) | ORAL | Status: DC
  Administered 2020-12-25 – 2020-12-31 (×19): 45 mL
  Filled 2020-12-25 (×18): qty 45

## 2020-12-25 MED ORDER — HEPARIN SODIUM (PORCINE) 1000 UNIT/ML IJ SOLN
1000.0000 [IU] | INTRAMUSCULAR | Status: DC | PRN
  Administered 2021-01-03: 1000 [IU] via INTRAVENOUS

## 2020-12-25 MED ORDER — FREE WATER
100.0000 mL | Status: DC
  Administered 2020-12-25 – 2021-01-02 (×45): 100 mL

## 2020-12-25 MED ORDER — LEVETIRACETAM 100 MG/ML PO SOLN
500.0000 mg | Freq: Two times a day (BID) | ORAL | Status: DC
  Administered 2020-12-25 – 2021-01-24 (×62): 500 mg
  Filled 2020-12-25 (×65): qty 5

## 2020-12-25 MED ORDER — MORPHINE SULFATE ER 15 MG PO TBCR: 15.0000 mg | EXTENDED_RELEASE_TABLET | Freq: Two times a day (BID) | ORAL | Status: DC | PRN

## 2020-12-25 MED ORDER — HEPARIN SODIUM (PORCINE) 1000 UNIT/ML IJ SOLN
INTRAMUSCULAR | Status: AC
  Administered 2020-12-25: 3200 [IU] via INTRAVENOUS
  Filled 2020-12-25: qty 4

## 2020-12-25 MED ORDER — OSMOLITE 1.5 CAL PO LIQD
1000.0000 mL | ORAL | Status: AC
  Administered 2020-12-25 – 2020-12-31 (×5): 1000 mL
  Filled 2020-12-25 (×10): qty 1000

## 2020-12-25 MED ORDER — FENTANYL 25 MCG/HR TD PT72
1.0000 | MEDICATED_PATCH | TRANSDERMAL | Status: DC
  Administered 2020-12-25 – 2020-12-28 (×2): 1 via TRANSDERMAL
  Filled 2020-12-25 (×2): qty 1

## 2020-12-25 MED ORDER — GLYCOPYRROLATE 1 MG PO TABS
1.0000 mg | ORAL_TABLET | Freq: Two times a day (BID) | ORAL | Status: DC
  Administered 2020-12-25 – 2021-01-06 (×24): 1 mg
  Filled 2020-12-25 (×25): qty 1

## 2020-12-25 MED ORDER — HEPARIN SODIUM (PORCINE) 1000 UNIT/ML IJ SOLN
INTRAMUSCULAR | Status: AC
  Filled 2020-12-25: qty 4

## 2020-12-25 NOTE — Plan of Care (Signed)

## 2020-12-25 NOTE — Progress Notes (Signed)
Patient ID: Thomas [name], male   DOB: 27-Nov-1955, 65 y.o.   MRN: [id number] [facility] Progress Note   Assessment/ Plan:   AKI/CKD stage IIIb - ischemic ATN in setting of severe sepsis and multisystem organ failure.  Admission creatinine was 2.1 and earlier on CRRT (stopped after 8/9) and transitioned to IHD-underwent left IJ TDC placement on 8/15.  Remains anuric and getting dialysis today after unable to get it yesterday (staffing shortage). Altered mental status-with prior history of CVA in 2015 and seizure disorder,.  Volume overload-with continued diarrhea/stool losses and will undertake ultrafiltration with hemodialysis today. Sepsis/aspiration pneumonia -hemodynamic status improved/off pressors and has completed his course of  IV cefepime and vancomycin Chronic hypoxic respiratory failure -status post tracheostomy and ongoing supplementation via T-bar. Stage IIIB colon cancer - sp partial colectomy, colostomy. On chemoRx sp 3 cycles Right leg DVT - anticoagulation per primary team  Atrial fibrillation - on Eliquis, rate controlled Anemia of critical illness: Continue to monitor hemoglobin/hematocrit trend-holding erythropoietin with active malignancy.  Subjective:   Denies any complaints while on dialysis.   Objective:   BP 139/81   Pulse 99   Temp 97.8 F (36.6 C) (Oral)   Resp (!) 26   Ht _0  (1.727 m)   Wt 108.6 kg   SpO2 97%   BMI 36.40 kg/m   Intake/Output Summary (Last 24 hours) at 12/25/2020 0924 Last data filed at 12/24/2020 1718 Gross per 24 hour  Intake 332 ml  Output 250 ml  Net 82 ml   Weight change: 1.5 kg  Physical Exam: Gen: comfortably resting on HD, on tracheal collar with ongoing supplement CVS: Pulse regular rhythm, normal rate, S1 and S2 normal Resp: Anteriorly clear to auscultation, no rales/rhonchi.  Left IJ TDC in situ Abd: Soft, nontender, ostomy in place Ext: 1+ bilateral lower extremity edema, 1-2+ upper extremity  edema.  Imaging: DG Abd Portable 1V  Result Date: 12/23/2020 CLINICAL DATA:  Abdominal distension EXAM: PORTABLE ABDOMEN - 1 VIEW COMPARISON:  Most recent radiograph 12/19/2020. Most recent CT 12/08/2020 FINDINGS: Weighted enteric tube in place with tip in the right upper quadrant in the region of the distal stomach or proximal duodenum. There is a generalized paucity of bowel gas throughout the abdomen and pelvis. No gaseous bowel distension. Left femoral catheter is in place. No evidence of free air in the supine views. No radiopaque calculi. IMPRESSION: 1. Generalized paucity of bowel gas, nonspecific. There is no gaseous bowel distension. 2. Weighted enteric tube in place with tip in the right upper quadrant in the region of the distal stomach or proximal duodenum. Electronically Signed   By: Keith Rake M.D.   On: 12/23/2020 15:16    Labs: BMET Recent Labs  Lab 12/19/20 0354 12/20/20 0356 12/21/20 0427 12/22/20 0308 12/23/20 1026 12/24/20 0435 12/25/20 0326  NA 139 132* 137 137 138 140 141  K 4.4 3.5 4.2 4.5 3.8 3.8 4.2  CL 109 101 106 110 103 108 106  CO2 16* 19* 20* 17* 23 21* 19*  GLUCOSE 104* 340* 335* 220* 136* 108* 148*  BUN 106* 49* 58* 70* 37* 49* 57*  CREATININE 4.78* 3.33* 4.61* 5.43* 3.90* 5.02* 6.06*  CALCIUM 7.5* 7.1* 6.9* 7.1* 7.9* 8.0* 8.3*  PHOS 8.6* 4.8*  --  4.9* 3.4 4.3 6.4*   CBC Recent Labs  Lab 12/22/20 0308 12/23/20 0543 12/24/20 0435 12/24/20 1719 12/25/20 0326  WBC 10.0 9.0 10.9*  --  9.6  HGB 7.1* 7.1* 6.9*  7.9* 7.6*  HCT 22.5* 21.7* 22.1* 24.2* 23.9*  MCV 91.8 90.4 92.5  --  91.9  PLT 213 184 203  --  177    Medications:     apixaban  5 mg Per Tube BID   atorvastatin  20 mg Per Tube Daily   chlorhexidine gluconate (MEDLINE KIT)  15 mL Mouth Rinse BID   Chlorhexidine Gluconate Cloth  6 each Topical Q1200   clonazepam  1 mg Per Tube TID   feeding supplement (PROSource TF)  45 mL Per Tube BID   fiber  1 packet Per Tube BID    glycopyrrolate  1 mg Per Tube BID   hydrocerin   Topical BID   insulin aspart  0-15 Units Subcutaneous Q4H   insulin aspart  6 Units Subcutaneous Q4H   insulin detemir  18 Units Subcutaneous BID   levETIRAcetam  500 mg Per Tube BID   mouth rinse  15 mL Mouth Rinse QID   pantoprazole sodium  40 mg Per Tube Daily   QUEtiapine  50 mg Per Tube BID   thiamine  100 mg Per Tube Daily   Elmarie Shiley, MD 12/25/2020, 9:24 AM

## 2020-12-25 NOTE — Progress Notes (Addendum)
NAME:  Thomas [name], MRN:  [id number], DOB:  13-Jul-1955, LOS: 24 ADMISSION DATE:  11/30/2020, CONSULTATION DATE:  7/30 REFERRING MD:  Riccardo Dubin, CHIEF COMPLAINT:  Worsening hypoxia/dyspnea  History of Present Illness:  65 yo M admitted for worsening sepsis vs reaction to chemotherapy with high ostomy output and soft BP on 7/25. Has had issues during his hospitalization with recurrent aspirations and more recently worsening respiratory status.  Most recently pt developed worsening hypoxia and was requiring BIPAP w/ FIO2 of 100% and increased work of breathing.   Who was admitted to intensive care and subsequently had worsening clinical status requiring intubation, CRRT, multiple pressors and pneumonia along with CT findings 8/1 concerning for ischemic bowel.   Significant Hospital Events:  7/27 Transferred to [facility], stopped abx as sepsis resolved/ruled out 7/30 AM Worsened AME, more hypoxic with worsening renal funciton, new aspiration on abx restarted 7/30 PM Pt with increased o2 requirement on 100% FIO2 on BIPAP, d/w family who wanted to proceed with intubation, pt intubated 8/1 Some issues with tachycardia and hypotension overnight. Reproted high ostomy output  8/2 down to 9 mcg of Levophed, however repeat CT abdomen/pelvis showed inflammatory wall thickening and fat stranding of the descending and sigmoid colon, new since prior, consistent with nonspecific infectious, inflammatory, or ischemic colitis.  Also new dense consolidative airspace opacity in the right lower and right middle lobes 8/3 further weaning NE. Weaning sedation. Full code, full scope per family meeting 8/2  8/4 off pressors, plt down to 103.  8/5 remains off pressors. Moving around more, not really following commands though  8/9-failed extubation 8/8, tolerated nasal cannula for about an hour, desaturations and inability to clear secretions-reintubated 8/8 8/10 some agitation overnight 8/11 tracheostomy  placement  Micro: Resp viral panel 7/24 > neg covid/ flu BC  7/24  x 2 neg  MRSA  7/25 neg  UC   7/25  neg  C diff screeen 7/26  neg ET culture 7/31 > moderate yeast> grew mod candida tropicalis GI panel  8/1> neg   Abx: Unasyn 7/30 >>7/31 Maxepime 7/31 >> 8/7 Flagyl 7/31 >>  8/7 Vancomycin 8/1 only  Anidulafungin 8/1>>8/3  Interim History / Subjective:   Patient tolerating trach collar. Working with Speech therapy with PMV.  Objective   Blood pressure 113/69, pulse (!) 103, temperature 98.5 F (36.9 C), temperature source Oral, resp. rate 17, height 5\' 8"  (1.727 m), weight 105.4 kg, SpO2 96 %.    FiO2 (%):  [35 %] 35 %   Intake/Output Summary (Last 24 hours) at 12/25/2020 1317 Last data filed at 12/25/2020 1107 Gross per 24 hour  Intake 332 ml  Output 3250 ml  Net -2918 ml   Filed Weights   12/25/20 0500 12/25/20 0725 12/25/20 1107  Weight: 108.9 kg 108.6 kg 105.4 kg     Physical exam: General: chronically ill-appearing male, s/p trach HEENT: [place]/AT, eyes anicteric. Trach collar Neuro: somnolent, arouses to verbal stimuli Chest: Coarse and diminished breath sounds, no wheezes or rhonchi Heart: Regular rate and rhythm, no murmurs or gallops Abdomen: Soft, mildly tender in epigastrium, nondistended, bowel sounds present Skin: No rash  Resolved Hospital Problem list   Shock  HHS Afib RVR  Lactic Acidosis  Thrombocytopenia colitis  Assessment & Plan:   Acute hypoxemic respiratory failure s/p trach Aspiration pneumonia Patient had failed extubation 8/8 secondary to increased secretions -> low O2 sats. Tracheostomy performed 8/11 due to excessive secretions, due to difficult vent wean. Has been tolerating trach  collar since 8/16 Continue pulmonary hygiene Remove trach sutures today or tomorrow, per RT Plan for trach exchange 14 days after initial placement, likely next week if doing better with speech therapy and PMV valve.  PCCM to follow intermittently for  trach care.  Freda Jackson, MD [facility] Office: [phone]   See Amion for personal pager PCCM on call pager [phone] until 7pm. Please call Elink 7p-7a. [phone]

## 2020-12-25 NOTE — Progress Notes (Signed)
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   Dizziness    Dyspnea    pt states d/t weight   Epilepsy ([place])    takes Keppra daily   GERD (gastroesophageal reflux disease)    takes Omeprazole daily   Headache    Hyperlipidemia    takes Atorvastatin daily   Hypertension    takes Lotrel daily   Peripheral edema    takes Lasix daily   Peripheral neuropathy    takes Gabapentin daily   Pneumonia 3 yrs ago   hx of   Seizures ([place])    Sleep apnea    Urinary frequency    Past Surgical History:  Past Surgical History:  Procedure Laterality Date   BIOPSY  09/05/2020   Procedure: BIOPSY;  Surgeon: Irene Shipper, MD;  Location[facility] ENDOSCOPY;  Service: Endoscopy;;   CARDIAC CATHETERIZATION  7 yrs ago   COLONOSCOPY WITH PROPOFOL N/A 09/05/2020   Procedure: COLONOSCOPY WITH PROPOFOL;  Surgeon: Irene Shipper, MD;  Location: [facility] ENDOSCOPY;  Service: Endoscopy;  Laterality: N/A;   CRANIOTOMY N/A 09/01/2016   Procedure: CRANIOTOMY TUMOR  LEFT PTERIONAL;  Surgeon: Ashok Pall, MD;  Location: [place];  Service: Neurosurgery;  Laterality: N/A;   cyst removed from chest      as a child   IR FLUORO GUIDE CV LINE LEFT  12/22/2020   IR US GUIDE VASC ACCESS LEFT  12/22/2020   NO PAST SURGERIES     PARTIAL COLECTOMY N/A 09/10/2020   Procedure: TRANSVERSE COLECTOMY;  Surgeon: [name], MD;  Location: [place];  Service: General;  Laterality: N/A;   POLYPECTOMY   09/05/2020   Procedure: POLYPECTOMY;  Surgeon: Irene Shipper, MD;  Location: [place];  Service: Endoscopy;;   PORTACATH PLACEMENT Right 10/01/2020   Procedure: INSERTION PORT-A-CATH;  Surgeon: [name], MD;  Location: [place];  Service: General;  Laterality: Right;   SUBMUCOSAL TATTOO INJECTION  09/05/2020   Procedure: SUBMUCOSAL TATTOO INJECTION;  Surgeon: Irene Shipper, MD;  Location: [facility] ENDOSCOPY;  Service: Endoscopy;;   HPI:  Pt is a 65 y/o male admitted on 7/25 for worsening sepsis vs reaction to chemotherapy with high ostomy output and soft BP. Pt developed worsening hypoxia and was requiring BIPAP w/ FIO2 of 100% and increased work of breathing. SLP consulted due to concern for aspiration. BSE 7/28 recommended NPO status and MBS, but this could not completed subsequently due to lethargy. CXR in AM of 7/30 showed some worsening concern of aspiration. ETT 7/30-8/8; tolerated nasal cannula for about an hour; reintubated 8/8-trach 8/11. 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   Assessment / Plan / Recommendation Clinical Impression  Pt seen for PMSV intervention on trach collar. RN provided suction prior to PMSV placement (moderate frothy secretions noted) pt with reflexive weaker coughing but unable to expel at tracheal site or orally. Per RN, pt has exhibited increased secretions; came back from HD covered in tracheal secretions. Pt alert but gesturing pain in abdomen. Nursing reports recent pain meds given, continuing to monitor as increased lethargy noted with increased pain meds. Cuff deflated at baseline. PMSV placed, vitals remained stable without change with PMSV usage. Pt with attempts to communicate but no audible phonation  elicited this date. Pt with reduced breath support for speech. No air trapping noted with PMSV use, suggesting fair airway patency. As clininically able recommend trach downsize and or cuffless trach with hopes of breath support for phonation. PMSV can be utilized with staff and SLP with full supervision as tolerated. SLP to continue to follow. Will monitor for readiness for clinical swallow eval.  SLP Visit Diagnosis: Aphonia (R49.1)    SLP Assessment  Patient needs continued Speech Lanaguage Pathology Services    Follow Up Recommendations  Skilled Nursing facility    Frequency and Duration min 2x/week  2 weeks    PMSV Trial PMSV was placed for: 15 minutes Able to redirect subglottic air through upper airway: Yes Able to Attain Phonation: No Voice Quality: Aphonic Able to Expectorate Secretions: Yes Level of Secretion Expectoration with PMSV: Not observed Breath Support for Phonation: Severely decreased Intelligibility: Intelligibility reduced Word: 0-24% accurate Phrase: 0-24% accurate Sentence: 0-24% accurate Conversation: 0-24% accurate Respirations During Trial: 18 SpO2 During Trial: 98 % Pulse During Trial: 97 Behavior: Alert;Cooperative   Tracheostomy Tube  Additional Tracheostomy Tube Assessment Fenestrated: No Trach Collar Period: all waking hours Secretion Description: frothy Frequency of Tracheal Suctioning: intermittently Level of Secretion Expectoration: Tracheal    Vent Dependency  Vent Dependent: No FiO2 (%): 35 %    Cuff Deflation Trial  GO Tolerated Cuff Deflation: Yes Length of Time for Cuff Deflation Trial: cuff deflated at baseline Behavior: Alert;Cooperative        [place], [place]   12/25/2020, 2:06 PM

## 2020-12-25 NOTE — Procedures (Signed)
Patient seen on Hemodialysis. BP 139/81   Pulse 99   Temp 97.8 F (36.6 C) (Oral)   Resp (!) 26   Ht 5\' 8"  (1.727 m)   Wt 108.6 kg   SpO2 97%   BMI 36.40 kg/m   QB 400, UF goal 3L Tolerating treatment without complaints at this time.   Elmarie Shiley MD [facility]. Office # [phone] Pager # [phone] 9:28 AM

## 2020-12-25 NOTE — Plan of Care (Signed)
  Problem: Education: Goal: Knowledge of General Education information will improve Description Including pain rating scale, medication(s)/side effects and non-pharmacologic comfort measures Outcome: Progressing   

## 2020-12-25 NOTE — Progress Notes (Signed)
PT Cancellation Note  Patient Details Name: Thomas [name] MRN: [id number] DOB: 03-12-1956   Cancelled Treatment:    Reason Eval/Treat Not Completed: Pain limiting ability to participate; patient back from HD but RN reports difficulty getting him comfortable due to mediastinal pain.  She reports working on suctioning and calling RT at the moment.  Patient declined PT today.  Will attempt again another day.   Reginia Naas 12/25/2020, 2:07 PM Magda Kiel, [place] UZRVU:[phone] Office:[phone] 12/25/2020

## 2020-12-25 NOTE — Progress Notes (Signed)
Nutrition Follow-up  DOCUMENTATION CODES:   Non-severe (moderate) malnutrition in context of chronic illness, Obesity unspecified  INTERVENTION:   -D/c Vital High Protein -Initiate Osmolite 1.5 @ 60 ml/hr via cortrak tube  45 ml Prosource Plus TID.    Tube feeding regimen provides 2280 kcal (99% of needs), 123 grams of protein, and 1097 ml of H2O.    NUTRITION DIAGNOSIS:   Moderate Malnutrition related to chronic illness, cancer and cancer related treatments as evidenced by mild fat depletion, mild muscle depletion, percent weight loss.  Ongoing  GOAL:   Patient will meet greater than or equal to 90% of their needs  Met with TF  MONITOR:   Vent status, TF tolerance, Labs, Weight trends, Skin, I & O's  REASON FOR ASSESSMENT:   Consult Enteral/tube feeding initiation and management  ASSESSMENT:   65 y.o. male who is a former with medical history of stage 3 colon cancer s/p partial colectomy and end colostomy on 5/4, recent chemo start with 3rd cycle on 7/22, sleep apnea, seizures, epilepsy, brain tumor, HTN, HLD, GERD, asthma, peripheral neuropathy, urinary frequency, DM, MCA stroke. He presented to the ED via EMS due to abdominal pain x2 weeks, anorexia, and weakness. Admitted for close monitoring for concern of mesenteric ischemia. Patient reported that abdominal pain was worse with eating PTA.  7/31 CRRT initiated 8/9 CRRT stopped  8/10 tx to [facility] for trach placement and iHD 8/11 s/p trach 8/12- cortrak placed (tip of tube in stomach) 8/15- s/p Placement of a LEFT IJ 19 cm Palindrome tunneled HD catheter.  Catheter tip at caroatrial junction; transitioned to trach collar  Reviewed I/O's: +82 ml x 24 hours and -5.7 L since 12/11/20   Pt in HD at time of visit.   Pt transferred out of ICU to PCU on 12/23/20. Per SLP notes, working on [employer] trials.   RD will transition to standard TF formula, as pt is no longer receiving ICU level care.   Wt has been stable over the  past week.   Medications reviewed and include keppra.  Labs reviewed: K WDL, Phos: 6.4, CBGS: 114-139 (inpatient orders for glycemic control are 0-15 units insulin aspart every 4 hours and 18 units insulin detemir BID).    Diet Order:   Diet Order             Diet NPO time specified  Diet effective midnight                   EDUCATION NEEDS:   Not appropriate for education at this time  Skin:  Skin Assessment: Skin Integrity Issues: Skin Integrity Issues:: Stage II, DTI, Other (Comment) DTI: upper R + L face (newly documented 8/7) Stage II: mid- coccyx; R back (newly documented 8/7) Other: sloughing to L hand d/t hand-foot-and-mouth disease  Last BM:  8/12 via colostomy  Height:   Ht Readings from Last 1 Encounters:  12/01/20 _0  (1.727 m)    Weight:   Wt Readings from Last 1 Encounters:  12/25/20 108.6 kg    Ideal Body Weight:  70 kg  BMI:  Body mass index is 36.4 kg/m.  Estimated Nutritional Needs:   Kcal:  7672-0947  Protein:  120-140 gm  Fluid:  1 L + UOP    [name], RD, LDN, CDCES Registered Dietitian II Certified Diabetes Care and Education Specialist Please refer to [facility] for RD and/or RD on-call/weekend/after hours pager

## 2020-12-25 NOTE — Progress Notes (Signed)
PROGRESS NOTE        PATIENT DETAILS Name: Thomas [name] Age: 65 y.o. Sex: male Date of Birth: 12/27/1955 Admit Date: 11/30/2020 Admitting Physician Renee Pain, MD DVV:OHYWV, Rachel Moulds, NP  Brief Narrative: Patient is a 65 y.o. male with history of colon cancer-s/p partial transverse colectomy/colostomy 09/10/2020-recently started on chemotherapy prior to this hospitalization-who presented to the hospital on 7/24 with abdominal pain, anorexia, weakness-he was thought to have sepsis syndrome-and subsequently started on antimicrobial therapy.  He was initially admitted to the intensive care unit-but stabilized and transferred to [facility] on 7/27, unfortunately further hospital course was complicated by altered mental status/delirium-septic shock/acute hypoxic respiratory failure in the setting of aspiration pneumonia.  He was transferred to the ICU where he was intubated and did require pressor support.He had a prolonged stay in the ICU-complicated by development of AKI due to ATN requiring initiation of renal replacement therapy.  He eventually failed extubation and required tracheostomy placement.   He was transferred back to the [facility] on 8/17.   Significant events: 7/25>> SIRS-admit to ICU 7/27>> Transferred to [place], stopped abx as sepsis resolved/ruled out 7/30>> worsening hypoxia/new aspiration-worsening renal function-transfer to ICU-intubated  8/08>>failed extubation 8/8-reintubated 8/11>> tracheostomy placement 8/17>> transferred to [facility]  Significant studies: 7/26>> Echo: EF 37-10%, grade 1 diastolic dysfunction 6/26>> CT head: Negative for bleed/acute intracranial process. 7/29>> EEG: No seizures. 8/01>> CT abdomen/pelvis: Inflammatory wall thickening/fat stranding around descending/sigmoid colon.  Airspace opacity in the right lower lobe/right middle lobe. 8/05>> bilateral lower extremity Doppler: DVT right femoral  vein, right proximal profunda vein, right popliteal vein  Antimicrobial therapy: Unasyn 7/30 >>7/31 Maxepime 7/31 >> 8/7 Flagyl 7/31 >>  8/7 Vancomycin 8/1 only  Anidulafungin 8/1>>8/3 Rocephin: 8/16>>8/17 Cefepime:8/17>>  Microbiology data: 7/24>> blood cultures: Negative 7/25>> urine culture: Negative 7/26>> C. difficile studies: Negative 7/31>> respiratory culture: Moderate Candida tropicalis. 8/01>> GI pathogen panel: Negative 8/15>> tracheal aspirate: Pseudomonas aeruginosa   Procedures : 7/30>> 8/11: ETT 8/11>> tracheostomy placement 8/15>> left IJ TDC  Consults: PCCM, surgery, oncology, nephrology, palliative care  DVT Prophylaxis : apixaban (ELIQUIS) tablet 5 mg    Subjective: Seen earlier today at hemodialysis-much more awake and alert compared to yesterday.  Continues to have very mild left-sided abdominal pain.  Assessment/Plan: Septic shock due to aspiration pneumonia: Sepsis physiology has resolved-tracheal aspirate culture on 8/15 positive for Pseudomonas-on cefepime-we will plan a short duration of around 5 days.  Acute hypoxic respiratory failure due to aspiration pneumonia: Failed extubation attempt-underwent tracheostomy on 8/11.  Currently stable on trach collar.  PCCM following for trach care.  Dysphagia: In a setting of acute illness-respiratory failure-tracheostomy-severe deconditioning-on NG tube feedings currently-await further recommendations from SLP.  Continue n.p.o. status.  Acute metabolic encephalopathy: Due to septic shock-hypoxemia-slowly improving-CT head negative for acute intracranial process.  Continue Seroquel and Klonopin.  AKI on CKD stage IIIb: AKI felt to be hemodynamically mediated-likely ATN-was on CRRT while in the ICU-has been transitioned to intermittent HD.  Nephrology following and directing care.  AKI felt to be hemodynamically mediated-likely ischemic ATN-initially on CRRT while in the ICU-transition to intermittent HD.  No  evidence of renal recovery as of now-nephrology following and directing intermittent HD.  Right lower extremity DVT: On Eliquis.  PAF with RVR: Provoked by acute illness/sepsis-maintaining sinus rhythm.  Echo with stable EF.  On Eliquis for DVT.  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 Benay Spice.  Anemia of critical illness: Hemoglobin stable at 7.6-did require 1 unit of PRBC on 8/17.  No overt blood loss.  Continue periodic CBC monitoring.    Seizure disorder: Continue Keppra-EEG negative.  History of frontal meningioma-s/p resection 09/01/2016  HLD: Continue statin  DM-2 (A1c 8.7 on 7/25): CBG stable-continue 18 units of Levemir twice daily, 6 units of NovoLog every 4 hours and SSI.  Follow and adjust  Recent Labs    12/25/20 0021 12/25/20 0458 12/25/20 1154  GLUCAP 139* 114* 144*     History of diabetic neuropathy: Resume Neurontin when able.  GERD: Continue PPI  Capecitabine induced desquamation/Hand/foot syndrome:Continue moisturizer to hands and feet and scrotum  Severe debility/deconditioning/dysphagia: Due to acute illness-continue PT/OT/SLP follow-up-likely will require SNF on discharge.  Nutrition Status: Nutrition Problem: Moderate Malnutrition Etiology: chronic illness, cancer and cancer related treatments Signs/Symptoms: mild fat depletion, mild muscle depletion, percent weight loss Percent weight loss: 11 % Interventions: Tube feeding, Prostat  Pressure injury: Pressure Injury 12/01/20 Coccyx Mid Stage 2 -  Partial thickness loss of dermis presenting as a shallow open injury with a red, pink wound bed without slough. stage 2 decubitus to coxxyx (Active)  12/01/20 0900  Location: Coccyx  Location Orientation: Mid  Staging: Stage 2 -  Partial thickness loss of dermis presenting as a shallow open injury with a red, pink  wound bed without slough.  Wound Description (Comments): stage 2 decubitus to coxxyx  Present on Admission: Yes     Pressure Injury 12/14/20 Face Upper;Mid Deep Tissue Pressure Injury - Purple or maroon localized area of discolored intact skin or blood-filled blister due to damage of underlying soft tissue from pressure and/or shear. circular shape broken area & sloug (Active)  12/14/20 0100  Location: Face  Location Orientation: Upper;Mid  Staging: Deep Tissue Pressure Injury - Purple or maroon localized area of discolored intact skin or blood-filled blister due to damage of underlying soft tissue from pressure and/or shear.  Wound Description (Comments): circular shape broken area & sloughing skin  Present on Admission: No     Pressure Injury 12/14/20 Face Left;Upper Deep Tissue Pressure Injury - Purple or maroon localized area of discolored intact skin or blood-filled blister due to damage of underlying soft tissue from pressure and/or shear. circular shape broken area & slou (Active)  12/14/20 0100  Location: Face  Location Orientation: Left;Upper  Staging: Deep Tissue Pressure Injury - Purple or maroon localized area of discolored intact skin or blood-filled blister due to damage of underlying soft tissue from pressure and/or shear.  Wound Description (Comments): circular shape broken area & sloughing skin  Present on Admission: No     Pressure Injury 12/23/20 Buttocks Left Stage 2 -  Partial thickness loss of dermis presenting as a shallow open injury with a red, pink wound bed without slough. left buttock stage 2 (Active)  12/23/20   Location: Buttocks  Location Orientation: Left  Staging: Stage 2 -  Partial thickness loss of dermis presenting as a shallow open injury with a red, pink wound bed without slough.  Wound Description (Comments): left buttock stage 2  Present on Admission: Yes   Obesity: Estimated body mass index is 35.33 kg/m as calculated from the following:    Height as of this encounter: 5' 8"  (1.727 m).   Weight as of this encounter: 105.4 kg.  Diet: Diet Order             Diet NPO time specified  Diet effective midnight                    Code Status: Full code   Family Communication: Spouse Mahalia Mcever-[phone] called on 8/18-no response-did not get a voicemail as well.  Disposition Plan: Status is: Inpatient  Remains inpatient appropriate because:Inpatient level of care appropriate due to severity of illness  Dispo: The patient is from: Home              Anticipated d/c is to: SNF              Patient currently is not medically stable to d/c.   Difficult to place patient No   Barriers to Discharge: N.p.o.-NG tube in place-resolving pneumonia-on IV cefepime-severely deconditioned-not yet stable for discharge.  Antimicrobial agents: Anti-infectives (From admission, onward)    Start     Dose/Rate Route Frequency Ordered Stop   12/24/20 1730  ceFEPIme (MAXIPIME) 1 g in sodium chloride 0.9 % 100 mL IVPB        1 g 200 mL/hr over 30 Minutes Intravenous Every 24 hours 12/24/20 1642     12/23/20 0845  cefTRIAXone (ROCEPHIN) 2 g in sodium chloride 0.9 % 100 mL IVPB  Status:  Discontinued        2 g 200 mL/hr over 30 Minutes Intravenous Every 24 hours 12/23/20 0800 12/24/20 1620   12/22/20 0600  ceFAZolin (ANCEF) IVPB 2g/100 mL premix        2 g 200 mL/hr over 30 Minutes Intravenous To Radiology 12/21/20 1503 12/22/20 1149   12/09/20 1800  vancomycin (VANCOREADY) IVPB 1750 mg/350 mL  Status:  Discontinued        1,750 mg 175 mL/hr over 120 Minutes Intravenous Every 24 hours 12/08/20 1357 12/09/20 1403   12/09/20 1700  anidulafungin (ERAXIS) 100 mg in sodium chloride 0.9 % 100 mL IVPB  Status:  Discontinued        100 mg 78 mL/hr over 100 Minutes Intravenous Every 24 hours 12/08/20 1359 12/09/20 1321   12/08/20 1500  vancomycin (VANCOCIN) 2,500 mg in sodium chloride 0.9 % 500 mL IVPB        2,500 mg 250 mL/hr  over 120 Minutes Intravenous  Once 12/08/20 1357 12/08/20 1719   12/08/20 1500  anidulafungin (ERAXIS) 200 mg in sodium chloride 0.9 % 200 mL IVPB        200 mg 78 mL/hr over 200 Minutes Intravenous  Once 12/08/20 1359 12/08/20 1821   12/08/20 0000  ceFEPIme (MAXIPIME) 2 g in sodium chloride 0.9 % 100 mL IVPB  Status:  Discontinued        2 g 200 mL/hr over 30 Minutes Intravenous Every 12 hours 12/07/20 1200 12/14/20 0858   12/07/20 1000  metroNIDAZOLE (FLAGYL) IVPB 500 mg  Status:  Discontinued        500 mg 100 mL/hr over 60 Minutes Intravenous Every 12 hours 12/07/20 0849 12/14/20 0858   12/07/20 0900  ceFEPIme (MAXIPIME) 2 g in sodium chloride 0.9 % 100 mL IVPB  Status:  Discontinued        2 g 200 mL/hr over 30 Minutes Intravenous Every 24 hours 12/07/20 0809 12/07/20 1200   12/07/20 0830  metroNIDAZOLE (FLAGYL) IVPB 500 mg  Status:  Discontinued        500 mg 100 mL/hr over 60 Minutes Intravenous Every 8 hours 12/07/20 0758  12/07/20 0849   12/06/20 0900  Ampicillin-Sulbactam (UNASYN) 3 g in sodium chloride 0.9 % 100 mL IVPB  Status:  Discontinued        3 g 200 mL/hr over 30 Minutes Intravenous Every 12 hours 12/06/20 0832 12/07/20 0757   12/03/20 0800  ceFEPIme (MAXIPIME) 2 g in sodium chloride 0.9 % 100 mL IVPB  Status:  Discontinued        2 g 200 mL/hr over 30 Minutes Intravenous Every 8 hours 12/03/20 0746 12/04/20 1004   12/01/20 1000  ceFEPIme (MAXIPIME) 2 g in sodium chloride 0.9 % 100 mL IVPB  Status:  Discontinued        2 g 200 mL/hr over 30 Minutes Intravenous Every 12 hours 12/01/20 0513 12/03/20 0746   12/01/20 0300  vancomycin (VANCOREADY) IVPB 2000 mg/400 mL        2,000 mg 200 mL/hr over 120 Minutes Intravenous  Once 12/01/20 0255 12/01/20 0528   11/30/20 2245  ceFEPIme (MAXIPIME) 2 g in sodium chloride 0.9 % 100 mL IVPB        2 g 200 mL/hr over 30 Minutes Intravenous STAT 11/30/20 2237 12/01/20 0003        Time spent: 35 minutes-Greater than 50% of this  time was spent in counseling, explanation of diagnosis, planning of further management, and coordination of care.  MEDICATIONS: Scheduled Meds:  apixaban  5 mg Per Tube BID   atorvastatin  20 mg Per Tube Daily   chlorhexidine gluconate (MEDLINE KIT)  15 mL Mouth Rinse BID   Chlorhexidine Gluconate Cloth  6 each Topical Q1200   clonazepam  1 mg Per Tube TID   feeding supplement (PROSource TF)  45 mL Per Tube TID   fiber  1 packet Per Tube BID   glycopyrrolate  1 mg Per Tube BID   heparin sodium (porcine)       hydrocerin   Topical BID   insulin aspart  0-15 Units Subcutaneous Q4H   insulin aspart  6 Units Subcutaneous Q4H   insulin detemir  18 Units Subcutaneous BID   levETIRAcetam  500 mg Per Tube BID   mouth rinse  15 mL Mouth Rinse QID   pantoprazole sodium  40 mg Per Tube Daily   QUEtiapine  50 mg Per Tube BID   thiamine  100 mg Per Tube Daily   Continuous Infusions:  sodium chloride Stopped (12/20/20 0955)   ceFEPime (MAXIPIME) IV 1 g (12/24/20 1759)   feeding supplement (OSMOLITE 1.5 CAL)     feeding supplement (VITAL 1.5 CAL) 1,000 mL (12/24/20 1803)   PRN Meds:.sodium chloride, acetaminophen, albuterol, dextrose, docusate, fentaNYL (SUBLIMAZE) injection, heparin sodium (porcine), sodium chloride flush   PHYSICAL EXAM: Vital signs: Vitals:   12/25/20 1030 12/25/20 1100 12/25/20 1107 12/25/20 1205  BP: 128/75 109/79 132/82 113/69  Pulse: (!) 102 (!) 106 (!) 102 (!) 103  Resp:   19 17  Temp:   98.6 F (37 C) 98.5 F (36.9 C)  TempSrc:   Oral Oral  SpO2:   96%   Weight:   105.4 kg   Height:       Filed Weights   12/25/20 0500 12/25/20 0725 12/25/20 1107  Weight: 108.9 kg 108.6 kg 105.4 kg   Body mass index is 35.33 kg/m.   Gen Exam:Alert awake-not in any distress-chronically sick appearing. HEENT:atraumatic, normocephalic Chest: B/L clear to auscultation anteriorly CVS:S1S2 regular Abdomen:soft non tender, non distended.  Old midline scar  present. Extremities:no edema Neurology: Generalized  weakness-but nonfocal. Skin: no rash   I have personally reviewed following labs and imaging studies  LABORATORY DATA: CBC: Recent Labs  Lab 12/21/20 0427 12/22/20 0308 12/23/20 0543 12/24/20 0435 12/24/20 1719 12/25/20 0326  WBC 11.7* 10.0 9.0 10.9*  --  9.6  HGB 7.3* 7.1* 7.1* 6.9* 7.9* 7.6*  HCT 22.9* 22.5* 21.7* 22.1* 24.2* 23.9*  MCV 91.2 91.8 90.4 92.5  --  91.9  PLT 182 213 184 203  --  177     Basic Metabolic Panel: Recent Labs  Lab 12/20/20 0356 12/21/20 0427 12/22/20 0308 12/23/20 1026 12/24/20 0435 12/25/20 0326  NA 132* 137 137 138 140 141  K 3.5 4.2 4.5 3.8 3.8 4.2  CL 101 106 110 103 108 106  CO2 19* 20* 17* 23 21* 19*  GLUCOSE 340* 335* 220* 136* 108* 148*  BUN 49* 58* 70* 37* 49* 57*  CREATININE 3.33* 4.61* 5.43* 3.90* 5.02* 6.06*  CALCIUM 7.1* 6.9* 7.1* 7.9* 8.0* 8.3*  PHOS 4.8*  --  4.9* 3.4 4.3 6.4*     GFR: Estimated Creatinine Clearance: 14.5 mL/min (A) (by C-G formula based on SCr of 6.06 mg/dL (H)).  Liver Function Tests: Recent Labs  Lab 12/21/20 0427 12/22/20 0308 12/23/20 1026 12/24/20 0435 12/25/20 0326  AST 16  --   --   --   --   ALT 22  --   --   --   --   ALKPHOS 72  --   --   --   --   BILITOT 1.0  --   --   --   --   PROT 5.6*  --   --   --   --   ALBUMIN 1.5* 1.6* 1.7* 1.7* 1.6*    No results for input(s): LIPASE, AMYLASE in the last 168 hours. No results for input(s): AMMONIA in the last 168 hours.  Coagulation Profile: No results for input(s): INR, PROTIME in the last 168 hours.  Cardiac Enzymes: No results for input(s): CKTOTAL, CKMB, CKMBINDEX, TROPONINI in the last 168 hours.  BNP (last 3 results) No results for input(s): PROBNP in the last 8760 hours.  Lipid Profile: No results for input(s): CHOL, HDL, LDLCALC, TRIG, CHOLHDL, LDLDIRECT in the last 72 hours.  Thyroid Function Tests: No results for input(s): TSH, T4TOTAL, FREET4, T3FREE, THYROIDAB  in the last 72 hours.  Anemia Panel: No results for input(s): VITAMINB12, FOLATE, FERRITIN, TIBC, IRON, RETICCTPCT in the last 72 hours.  Urine analysis:    Component Value Date/Time   COLORURINE YELLOW 12/01/2020 0200   APPEARANCEUR CLEAR 12/01/2020 0200   LABSPEC 1.023 12/01/2020 0200   PHURINE 5.0 12/01/2020 0200   GLUCOSEU >=500 (A) 12/01/2020 0200   HGBUR NEGATIVE 12/01/2020 0200   BILIRUBINUR NEGATIVE 12/01/2020 0200   KETONESUR NEGATIVE 12/01/2020 0200   PROTEINUR 100 (A) 12/01/2020 0200   UROBILINOGEN 0.2 09/27/2014 0301   NITRITE NEGATIVE 12/01/2020 0200   LEUKOCYTESUR NEGATIVE 12/01/2020 0200    Sepsis Labs: Lactic Acid, Venous    Component Value Date/Time   LATICACIDVEN 2.1 ([place]) 12/10/2020 0412    MICROBIOLOGY: Recent Results (from the past 240 hour(s))  Culture, Respiratory w Gram Stain     Status: None (Preliminary result)   Collection Time: 12/22/20 10:13 AM   Specimen: Tracheal Aspirate; Respiratory  Result Value Ref Range Status   Specimen Description TRACHEAL ASPIRATE  Final   Special Requests NONE  Final   Gram Stain   Final    FEW SQUAMOUS EPITHELIAL  CELLS PRESENT FEW WBC SEEN ABUNDANT GRAM POSITIVE COCCI FEW GRAM VARIABLE ROD    Culture   Final    RARE PSEUDOMONAS AERUGINOSA REPEATING SUSCEPTIBILITY Performed at [facility], [place] [address], [place], [place] [address]    Report Status PENDING  Incomplete    RADIOLOGY STUDIES/RESULTS: No results found.   LOS: 24 days   Oren Binet, MD  [facility]    To contact the attending provider between 7A-7P or the covering provider during after hours 7P-7A, please log into the web site [email or web address] and access using universal [place] password for that web site. If you do not have the password, please call the hospital operator.  12/25/2020, 1:58 PM

## 2020-12-25 NOTE — Progress Notes (Signed)
PT Cancellation Note  Patient Details Name: Thomas [name] MRN: [id number] DOB: [birth year]   Cancelled Treatment:    Reason Eval/Treat Not Completed: Patient at procedure or test/unavailable; patient in HD.  Will attempt later as time permits and pt tolerates.   Reginia Naas 12/25/2020, 9:17 AM Magda Kiel, PT [facility] BOBOF:[phone] Office:[phone] 12/25/2020

## 2020-12-26 LAB — CBC
HCT: 24.9 % — ABNORMAL LOW (ref 39.0–52.0)
Hemoglobin: 8 g/dL — ABNORMAL LOW (ref 13.0–17.0)
MCH: 28.9 pg (ref 26.0–34.0)
MCHC: 32.1 g/dL (ref 30.0–36.0)
MCV: 89.9 fL (ref 80.0–100.0)
Platelets: 170 10*3/uL (ref 150–400)
RBC: 2.77 MIL/uL — ABNORMAL LOW (ref 4.22–5.81)
RDW: 18.3 % — ABNORMAL HIGH (ref 11.5–15.5)
WBC: 9.8 10*3/uL (ref 4.0–10.5)
nRBC: 0 % (ref 0.0–0.2)

## 2020-12-26 LAB — GLUCOSE, CAPILLARY
Glucose-Capillary: 104 mg/dL — ABNORMAL HIGH (ref 70–99)
Glucose-Capillary: 106 mg/dL — ABNORMAL HIGH (ref 70–99)
Glucose-Capillary: 110 mg/dL — ABNORMAL HIGH (ref 70–99)
Glucose-Capillary: 125 mg/dL — ABNORMAL HIGH (ref 70–99)
Glucose-Capillary: 64 mg/dL — ABNORMAL LOW (ref 70–99)
Glucose-Capillary: 83 mg/dL (ref 70–99)
Glucose-Capillary: 86 mg/dL (ref 70–99)

## 2020-12-26 LAB — CULTURE, RESPIRATORY W GRAM STAIN

## 2020-12-26 LAB — RENAL FUNCTION PANEL
Albumin: 1.7 g/dL — ABNORMAL LOW (ref 3.5–5.0)
Anion gap: 11 (ref 5–15)
BUN: 34 mg/dL — ABNORMAL HIGH (ref 8–23)
CO2: 25 mmol/L (ref 22–32)
Calcium: 7.9 mg/dL — ABNORMAL LOW (ref 8.9–10.3)
Chloride: 97 mmol/L — ABNORMAL LOW (ref 98–111)
Creatinine, Ser: 3.99 mg/dL — ABNORMAL HIGH (ref 0.61–1.24)
GFR, Estimated: 16 mL/min — ABNORMAL LOW (ref >60)
Glucose, Bld: 88 mg/dL (ref 70–99)
Phosphorus: 4.2 mg/dL (ref 2.5–4.6)
Potassium: 3.9 mmol/L (ref 3.5–5.1)
Sodium: 133 mmol/L — ABNORMAL LOW (ref 135–145)

## 2020-12-26 MED ORDER — DEXTROSE 10 % IV SOLN: Freq: Every day | INTRAVENOUS | Status: AC | PRN

## 2020-12-26 MED ORDER — GLYCOPYRROLATE 0.2 MG/ML IJ SOLN
0.4000 mg | Freq: Once | INTRAMUSCULAR | Status: AC
  Administered 2020-12-26: 0.4 mg via INTRAVENOUS
  Filled 2020-12-26: qty 2

## 2020-12-26 MED ORDER — CHLORHEXIDINE GLUCONATE CLOTH 2 % EX PADS
6.0000 | MEDICATED_PAD | Freq: Every day | CUTANEOUS | Status: DC
  Administered 2020-12-27 – 2021-01-01 (×6): 6 via TOPICAL

## 2020-12-26 MED ORDER — IOHEXOL 9 MG/ML PO SOLN
ORAL | Status: AC
  Administered 2020-12-26: 500 mL
  Filled 2020-12-26: qty 1000

## 2020-12-26 NOTE — Progress Notes (Signed)
PROGRESS NOTE        PATIENT DETAILS Name: Thomas GALDAMEZ Age: 65 y.o. Sex: male Date of Birth: December 07, 1955 Admit Date: 11/30/2020 Admitting Physician Renee Pain, MD VOH:[id number], Rachel Moulds, NP  Brief Narrative: Patient is a 65 y.o. male with history of colon cancer-s/p partial transverse colectomy/colostomy 09/10/2020-recently started on chemotherapy prior to this hospitalization-who presented to the hospital on 7/24 with abdominal pain, anorexia, weakness-he was thought to have sepsis syndrome-and subsequently started on antimicrobial therapy.  He was initially admitted to the intensive care unit-but stabilized and transferred to the [facility] on 7/27, unfortunately further hospital course was complicated by altered mental status/delirium-septic shock/acute hypoxic respiratory failure in the setting of aspiration pneumonia.  He was transferred to the ICU where he was intubated and did require pressor support.He had a prolonged stay in the ICU-complicated by development of AKI due to ATN requiring initiation of renal replacement therapy.  He eventually failed extubation and required tracheostomy placement.   He was transferred back to the [facility] on 8/17.   Significant events: 7/25>> SIRS-admit to ICU 7/27>> Transferred to [place], stopped abx as sepsis resolved/ruled out 7/30>> worsening hypoxia/new aspiration-worsening renal function-transfer to ICU-intubated  8/08>>failed extubation 8/8-reintubated 8/11>> tracheostomy placement 8/17>> transferred to [facility]  Significant studies: 7/26>> Echo: EF 10-62%, grade 1 diastolic dysfunction 6/94>> CT head: Negative for bleed/acute intracranial process. 7/29>> EEG: No seizures. 8/01>> CT abdomen/pelvis: Inflammatory wall thickening/fat stranding around descending/sigmoid colon.  Airspace opacity in the right lower lobe/right middle lobe. 8/05>> bilateral lower extremity Doppler: DVT right femoral  vein, right proximal profunda vein, right popliteal vein  Antimicrobial therapy: Unasyn 7/30 >>7/31 Maxepime 7/31 >> 8/7 Flagyl 7/31 >>  8/7 Vancomycin 8/1 only  Anidulafungin 8/1>>8/3 Rocephin: 8/16>>8/17 Cefepime:8/17>>  Microbiology data: 7/24>> blood cultures: Negative 7/25>> urine culture: Negative 7/26>> C. difficile studies: Negative 7/31>> respiratory culture: Moderate Candida tropicalis. 8/01>> GI pathogen panel: Negative 8/15>> tracheal aspirate: Pseudomonas aeruginosa   Procedures : 7/30>> 8/11: ETT 8/11>> tracheostomy placement 8/15>> left IJ TDC  Consults: PCCM, surgery, oncology, nephrology, palliative care  DVT Prophylaxis : apixaban (ELIQUIS) tablet 5 mg    Subjective: Appears somewhat more uncomfortable today-continues to point down to his abdomen complaining of pain.  Shakes his head yes when asked if his pain is worse today compared to the past few days.  Assessment/Plan: Septic shock due to aspiration pneumonia: Sepsis physiology has resolved-tracheal aspirate culture on 8/15 positive for Pseudomonas-remains on cefepime-Short course of 5 days planned.    Acute hypoxic respiratory failure due to aspiration pneumonia: Failed extubation attempt-underwent tracheostomy on 8/11.  Currently stable on trach collar.  PCCM following for trach care.  Dysphagia: In a setting of acute illness-respiratory failure-tracheostomy-severe deconditioning-on NG tube feedings currently-await further recommendations from SLP to see if he is ready for swallow evaluation.  Continue n.p.o. status.  Acute metabolic encephalopathy: Due to septic shock-hypoxemia-slowly improving-CT head negative for acute intracranial process.  Continue Seroquel and Klonopin.  AKI on CKD stage IIIb: AKI due to ischemic ATN.  Nephrology following-on intermittent HD.  No signs of renal recovery as of yet.  Will await further recommendations from nephrology.    Right lower extremity DVT: On  Eliquis.  PAF with RVR: Provoked by acute illness/sepsis-maintaining sinus rhythm.  Echo with stable EF.  On Eliquis for DVT.  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 Benay Spice.  Colitis: Seen initially on CT abdomen on 8/1-was evaluated by general surgery-with recommendations to continue supportive care.  Since he continues to have abdominal pain which seems to be slightly worse today-we will repeat CT abdomen and pelvis.  Anemia of critical illness: Hemoglobin stable at 8.0-has required PRBC transfusion.  No overt blood loss.  Continue periodic CBC monitoring.    Seizure disorder: Continue Keppra-EEG negative.  History of frontal meningioma-s/p resection 09/01/2016  HLD: Continue statin  DM-2 (A1c 8.7 on 7/25): CBG stable-continue 18 units of Levemir twice daily, 6 units of NovoLog every 4 hours and SSI.  Follow and adjust  Recent Labs    12/25/20 2330 12/26/20 0342 12/26/20 0805  GLUCAP 147* 86 106*     History of diabetic neuropathy: Resume Neurontin when able.  GERD: Continue PPI  Capecitabine induced desquamation/Hand/foot syndrome:Continue moisturizer to hands and feet and scrotum  Severe debility/deconditioning/dysphagia: Due to acute illness-continue PT/OT/SLP follow-up-likely will require SNF on discharge.  Nutrition Status: Nutrition Problem: Moderate Malnutrition Etiology: chronic illness, cancer and cancer related treatments Signs/Symptoms: mild fat depletion, mild muscle depletion, percent weight loss Percent weight loss: 11 % Interventions: Tube feeding, Prostat  Pressure injury: Pressure Injury 12/01/20 Coccyx Mid Stage 2 -  Partial thickness loss of dermis presenting as a shallow open injury with a red, pink wound bed without slough. stage 2 decubitus to coxxyx (Active)  12/01/20 0900  Location: Coccyx  Location  Orientation: Mid  Staging: Stage 2 -  Partial thickness loss of dermis presenting as a shallow open injury with a red, pink wound bed without slough.  Wound Description (Comments): stage 2 decubitus to coxxyx  Present on Admission: Yes     Pressure Injury 12/14/20 Face Upper;Mid Deep Tissue Pressure Injury - Purple or maroon localized area of discolored intact skin or blood-filled blister due to damage of underlying soft tissue from pressure and/or shear. circular shape broken area & sloug (Active)  12/14/20 0100  Location: Face  Location Orientation: Upper;Mid  Staging: Deep Tissue Pressure Injury - Purple or maroon localized area of discolored intact skin or blood-filled blister due to damage of underlying soft tissue from pressure and/or shear.  Wound Description (Comments): circular shape broken area & sloughing skin  Present on Admission: No     Pressure Injury 12/14/20 Face Left;Upper Deep Tissue Pressure Injury - Purple or maroon localized area of discolored intact skin or blood-filled blister due to damage of underlying soft tissue from pressure and/or shear. circular shape broken area & slou (Active)  12/14/20 0100  Location: Face  Location Orientation: Left;Upper  Staging: Deep Tissue Pressure Injury - Purple or maroon localized area of discolored intact skin or blood-filled blister due to damage of underlying soft tissue from pressure and/or shear.  Wound Description (Comments): circular shape broken area & sloughing skin  Present on Admission: No     Pressure Injury 12/23/20 Buttocks Left Stage 2 -  Partial thickness loss of dermis presenting as a shallow open injury with a red, pink wound bed without slough. left buttock stage 2 (Active)  12/23/20   Location: Buttocks  Location Orientation: Left  Staging: Stage 2 -  Partial thickness loss of dermis presenting as a shallow open injury with a red, pink wound bed without slough.  Wound Description (Comments): left buttock stage 2   Present on Admission: Yes   Obesity: Estimated body mass index is 36.14 kg/m as  calculated from the following:   Height as of this encounter: _0  (1.727 m).   Weight as of this encounter: 107.8 kg.     Diet: Diet Order             Diet NPO time specified  Diet effective midnight                    Code Status: Full code   Family Communication: Spouse Mahalia [place]-[phone] updated on 8/19 Status is: Inpatient  Remains inpatient appropriate because:Inpatient level of care appropriate due to severity of illness  Dispo: The patient is from: Home              Anticipated d/c is to: SNF              Patient currently is not medically stable to d/c.   Difficult to place patient No   Barriers to Discharge: N.p.o.-NG tube in place-resolving pneumonia-on IV cefepime-severely deconditioned-not yet stable for discharge.  Antimicrobial agents: Anti-infectives (From admission, onward)    Start     Dose/Rate Route Frequency Ordered Stop   12/24/20 1730  ceFEPIme (MAXIPIME) 1 g in sodium chloride 0.9 % 100 mL IVPB        1 g 200 mL/hr over 30 Minutes Intravenous Every 24 hours 12/24/20 1642 12/29/20 1729   12/23/20 0845  cefTRIAXone (ROCEPHIN) 2 g in sodium chloride 0.9 % 100 mL IVPB  Status:  Discontinued        2 g 200 mL/hr over 30 Minutes Intravenous Every 24 hours 12/23/20 0800 12/24/20 1620   12/22/20 0600  ceFAZolin (ANCEF) IVPB 2g/100 mL premix        2 g 200 mL/hr over 30 Minutes Intravenous To Radiology 12/21/20 1503 12/22/20 1149   12/09/20 1800  vancomycin (VANCOREADY) IVPB 1750 mg/350 mL  Status:  Discontinued        1,750 mg 175 mL/hr over 120 Minutes Intravenous Every 24 hours 12/08/20 1357 12/09/20 1403   12/09/20 1700  anidulafungin (ERAXIS) 100 mg in sodium chloride 0.9 % 100 mL IVPB  Status:  Discontinued        100 mg 78 mL/hr over 100 Minutes Intravenous Every 24 hours 12/08/20 1359 12/09/20 1321   12/08/20 1500  vancomycin (VANCOCIN) 2,500 mg  in sodium chloride 0.9 % 500 mL IVPB        2,500 mg 250 mL/hr over 120 Minutes Intravenous  Once 12/08/20 1357 12/08/20 1719   12/08/20 1500  anidulafungin (ERAXIS) 200 mg in sodium chloride 0.9 % 200 mL IVPB        200 mg 78 mL/hr over 200 Minutes Intravenous  Once 12/08/20 1359 12/08/20 1821   12/08/20 0000  ceFEPIme (MAXIPIME) 2 g in sodium chloride 0.9 % 100 mL IVPB  Status:  Discontinued        2 g 200 mL/hr over 30 Minutes Intravenous Every 12 hours 12/07/20 1200 12/14/20 0858   12/07/20 1000  metroNIDAZOLE (FLAGYL) IVPB 500 mg  Status:  Discontinued        500 mg 100 mL/hr over 60 Minutes Intravenous Every 12 hours 12/07/20 0849 12/14/20 0858   12/07/20 0900  ceFEPIme (MAXIPIME) 2 g in sodium chloride 0.9 % 100 mL IVPB  Status:  Discontinued        2 g 200 mL/hr over 30 Minutes Intravenous Every 24 hours 12/07/20 0809 12/07/20 1200   12/07/20 0830  metroNIDAZOLE (FLAGYL) IVPB 500 mg  Status:  Discontinued  500 mg 100 mL/hr over 60 Minutes Intravenous Every 8 hours 12/07/20 0758 12/07/20 0849   12/06/20 0900  Ampicillin-Sulbactam (UNASYN) 3 g in sodium chloride 0.9 % 100 mL IVPB  Status:  Discontinued        3 g 200 mL/hr over 30 Minutes Intravenous Every 12 hours 12/06/20 0832 12/07/20 0757   12/03/20 0800  ceFEPIme (MAXIPIME) 2 g in sodium chloride 0.9 % 100 mL IVPB  Status:  Discontinued        2 g 200 mL/hr over 30 Minutes Intravenous Every 8 hours 12/03/20 0746 12/04/20 1004   12/01/20 1000  ceFEPIme (MAXIPIME) 2 g in sodium chloride 0.9 % 100 mL IVPB  Status:  Discontinued        2 g 200 mL/hr over 30 Minutes Intravenous Every 12 hours 12/01/20 0513 12/03/20 0746   12/01/20 0300  vancomycin (VANCOREADY) IVPB 2000 mg/400 mL        2,000 mg 200 mL/hr over 120 Minutes Intravenous  Once 12/01/20 0255 12/01/20 0528   11/30/20 2245  ceFEPIme (MAXIPIME) 2 g in sodium chloride 0.9 % 100 mL IVPB        2 g 200 mL/hr over 30 Minutes Intravenous STAT 11/30/20 2237 12/01/20  0003        Time spent: 35 minutes-Greater than 50% of this time was spent in counseling, explanation of diagnosis, planning of further management, and coordination of care.  MEDICATIONS: Scheduled Meds:  apixaban  5 mg Per Tube BID   atorvastatin  20 mg Per Tube Daily   chlorhexidine gluconate (MEDLINE KIT)  15 mL Mouth Rinse BID   Chlorhexidine Gluconate Cloth  6 each Topical Q1200   clonazepam  1 mg Per Tube TID   feeding supplement (PROSource TF)  45 mL Per Tube TID   fentaNYL  1 patch Transdermal Q72H   fiber  1 packet Per Tube BID   free water  100 mL Per Tube Q4H   glycopyrrolate  1 mg Per Tube BID   hydrocerin   Topical BID   insulin aspart  0-15 Units Subcutaneous Q4H   insulin aspart  6 Units Subcutaneous Q4H   insulin detemir  18 Units Subcutaneous BID   levETIRAcetam  500 mg Per Tube BID   mouth rinse  15 mL Mouth Rinse QID   pantoprazole sodium  40 mg Per Tube Daily   QUEtiapine  50 mg Per Tube BID   thiamine  100 mg Per Tube Daily   Continuous Infusions:  sodium chloride Stopped (12/20/20 0955)   ceFEPime (MAXIPIME) IV 1 g (12/25/20 1726)   feeding supplement (OSMOLITE 1.5 CAL) 1,000 mL (12/25/20 1459)   PRN Meds:.sodium chloride, acetaminophen, albuterol, dextrose, docusate, fentaNYL (SUBLIMAZE) injection, heparin sodium (porcine), sodium chloride flush   PHYSICAL EXAM: Vital signs: Vitals:   12/26/20 0339 12/26/20 0425 12/26/20 0801 12/26/20 0808  BP: 122/71  122/64   Pulse: 95  87   Resp: 17  (!) 21   Temp: 98 F (36.7 C)  98.1 F (36.7 C)   TempSrc: Axillary  Oral   SpO2: 100%  98% 96%  Weight:  107.8 kg    Height:       Filed Weights   12/25/20 0725 12/25/20 1107 12/26/20 0425  Weight: 108.6 kg 105.4 kg 107.8 kg   Body mass index is 36.14 kg/m.   Gen Exam:Alert awake-not in any distress HEENT:atraumatic, normocephalic Chest: B/L clear to auscultation anteriorly CVS:S1S2 regular Abdomen: Soft-no rebound but tenderness and some  guarding in the upper abdomen today.  Ostomy in place-brown stools. Extremities:no edema Neurology: Non focal Skin: no rash   I have personally reviewed following labs and imaging studies  LABORATORY DATA: CBC: Recent Labs  Lab 12/22/20 0308 12/23/20 0543 12/24/20 0435 12/24/20 1719 12/25/20 0326 12/26/20 0420  WBC 10.0 9.0 10.9*  --  9.6 9.8  HGB 7.1* 7.1* 6.9* 7.9* 7.6* 8.0*  HCT 22.5* 21.7* 22.1* 24.2* 23.9* 24.9*  MCV 91.8 90.4 92.5  --  91.9 89.9  PLT 213 184 203  --  177 170     Basic Metabolic Panel: Recent Labs  Lab 12/22/20 0308 12/23/20 1026 12/24/20 0435 12/25/20 0326 12/26/20 0420  NA 137 138 140 141 133*  K 4.5 3.8 3.8 4.2 3.9  CL 110 103 108 106 97*  CO2 17* 23 21* 19* 25  GLUCOSE 220* 136* 108* 148* 88  BUN 70* 37* 49* 57* 34*  CREATININE 5.43* 3.90* 5.02* 6.06* 3.99*  CALCIUM 7.1* 7.9* 8.0* 8.3* 7.9*  PHOS 4.9* 3.4 4.3 6.4* 4.2     GFR: Estimated Creatinine Clearance: 22.3 mL/min (A) (by C-G formula based on SCr of 3.99 mg/dL (H)).  Liver Function Tests: Recent Labs  Lab 12/21/20 0427 12/22/20 0308 12/23/20 1026 12/24/20 0435 12/25/20 0326 12/26/20 0420  AST 16  --   --   --   --   --   ALT 22  --   --   --   --   --   ALKPHOS 72  --   --   --   --   --   BILITOT 1.0  --   --   --   --   --   PROT 5.6*  --   --   --   --   --   ALBUMIN 1.5* 1.6* 1.7* 1.7* 1.6* 1.7*    No results for input(s): LIPASE, AMYLASE in the last 168 hours. No results for input(s): AMMONIA in the last 168 hours.  Coagulation Profile: No results for input(s): INR, PROTIME in the last 168 hours.  Cardiac Enzymes: No results for input(s): CKTOTAL, CKMB, CKMBINDEX, TROPONINI in the last 168 hours.  BNP (last 3 results) No results for input(s): PROBNP in the last 8760 hours.  Lipid Profile: No results for input(s): CHOL, HDL, LDLCALC, TRIG, CHOLHDL, LDLDIRECT in the last 72 hours.  Thyroid Function Tests: No results for input(s): TSH, T4TOTAL, FREET4,  T3FREE, THYROIDAB in the last 72 hours.  Anemia Panel: No results for input(s): VITAMINB12, FOLATE, FERRITIN, TIBC, IRON, RETICCTPCT in the last 72 hours.  Urine analysis:    Component Value Date/Time   COLORURINE YELLOW 12/01/2020 0200   APPEARANCEUR CLEAR 12/01/2020 0200   LABSPEC 1.023 12/01/2020 0200   PHURINE 5.0 12/01/2020 0200   GLUCOSEU >=500 (A) 12/01/2020 0200   HGBUR NEGATIVE 12/01/2020 0200   BILIRUBINUR NEGATIVE 12/01/2020 0200   KETONESUR NEGATIVE 12/01/2020 0200   PROTEINUR 100 (A) 12/01/2020 0200   UROBILINOGEN 0.2 09/27/2014 0301   NITRITE NEGATIVE 12/01/2020 0200   LEUKOCYTESUR NEGATIVE 12/01/2020 0200    Sepsis Labs: Lactic Acid, Venous    Component Value Date/Time   LATICACIDVEN 2.1 ([place]) 12/10/2020 0412    MICROBIOLOGY: Recent Results (from the past 240 hour(s))  Culture, Respiratory w Gram Stain     Status: None   Collection Time: 12/22/20 10:13 AM   Specimen: Tracheal Aspirate; Respiratory  Result Value Ref Range Status   Specimen Description TRACHEAL ASPIRATE  Final   Special  Requests NONE  Final   Gram Stain   Final    FEW SQUAMOUS EPITHELIAL CELLS PRESENT FEW WBC SEEN ABUNDANT GRAM POSITIVE COCCI FEW GRAM VARIABLE ROD Performed at [facility], [place] [address], [place], [place] [address]    Culture RARE PSEUDOMONAS AERUGINOSA  Final   Report Status 12/26/2020 FINAL  Final   Organism ID, Bacteria PSEUDOMONAS AERUGINOSA  Final      Susceptibility   Pseudomonas aeruginosa - MIC*    CEFTAZIDIME <=1 SENSITIVE Sensitive     GENTAMICIN <=1 SENSITIVE Sensitive     PIP/TAZO 8 SENSITIVE Sensitive     * RARE PSEUDOMONAS AERUGINOSA    RADIOLOGY STUDIES/RESULTS: No results found.   LOS: 25 days   Oren Binet, MD  [facility]    To contact the attending provider between 7A-7P or the covering provider during after hours 7P-7A, please log into the web site [email or web address] and access using universal [place] password for that  web site. If you do not have the password, please call the hospital operator.  12/26/2020, 11:23 AM

## 2020-12-26 NOTE — Progress Notes (Signed)
Patient's spouse requested patient to have a different type of pain medication due to patient not being comfortable. Md notified via secure chat. Md ordered ms contin. Spouse made aware of md ordered and she immediately stated that she did not want him to have ms contin due to her knowledge of medication. Spouse was educated on ms contin but she still did not want medication. Spouse also spoke with the charge nurse and he also educated her on medication. Md notified again of spouse concern and he discontinued ms contin and ordered fentanyl patch. Spouse then asked could patient received prn iv fentanyl again. Patient received night medications, prn fentanyl and fentanyl patch. Patient then rested and vital signs remained stable.

## 2020-12-26 NOTE — Progress Notes (Signed)
[facility] night shift progressive care unit coverage note.  The nursing staff reported that the patient has been hypoglycemic with a last glucose 63 mg/dL.  Evening insulin will be held.  Patient has an order for 50% dextrose as needed.  He is on tube feedings.  I have added as needed dextrose 10% infusion 50 mL/h for 10 hours in case the hypoglycemia recurs.  The staff also stated that the patient is having significant secretions despite receiving glycopyrrolate 10 mg per tube twice daily.  They made me aware that he experienced similar problems with secretions handling last night as well.  Glycopyrrolate 0.4 mg IVP every 6 hours as needed x2 doses ordered.  Tennis Must, MD.

## 2020-12-26 NOTE — Progress Notes (Signed)
[facility] NEPHROLOGY PROGRESS NOTE  Assessment/ Plan:  # AKI/CKD stage IIIb - ischemic ATN in setting of severe sepsis and multisystem organ failure.  Admission creatinine was 2.1 and earlier on CRRT (stopped after 8/9) and transitioned to IHD-underwent left IJ TDC placement on 8/15.  Remains anuric and getting dialysis intermittently. Last HD on 81/8 with 3 L UF, plan for next HD tomorrow. D/w renal navagator to follow for possible need of OP HD unit for AKI.   # Altered mental status-with prior history of CVA in 2015 and seizure disorder,.   # Volume overload-with continued diarrhea/stool losses and ultrafiltration with hemodialysis.  # Sepsis/aspiration pneumonia -hemodynamic status improved/off pressors and has completed the course of  IV cefepime and vancomycin. Back on cefepime now.   # Chronic hypoxic respiratory failure -status post tracheostomy and ongoing supplementation via T-bar.  # Stage IIIB colon cancer - sp partial colectomy, colostomy. On chemoRx sp 3 cycles.  # Right leg DVT - anticoagulation per primary team.  # Atrial fibrillation - on Eliquis, rate controlled.  # Anemia of critical illness: Continue to monitor hemoglobin/hematocrit trend-holding erythropoietin with active malignancy.  Discussed with primary team.   Subjective:  Lying on bed, looks lethargic, able to open eyes, no new event.  Objective Vital signs in last 24 hours: Vitals:   12/26/20 0801 12/26/20 0808 12/26/20 1127 12/26/20 1202  BP: 122/64   108/65  Pulse: 87   83  Resp: (!) 21   (!) 23  Temp: 98.1 F (36.7 C)   97.7 F (36.5 C)  TempSrc: Oral   Axillary  SpO2: 98% 96% 100% 97%  Weight:      Height:       Weight change: -0.3 kg No intake or output data in the 24 hours ending 12/26/20 1404     Labs: Basic Metabolic Panel: Recent Labs  Lab 12/24/20 0435 12/25/20 0326 12/26/20 0420  NA 140 141 133*  K 3.8 4.2 3.9  CL 108 106 97*  CO2 21* 19* 25  GLUCOSE  108* 148* 88  BUN 49* 57* 34*  CREATININE 5.02* 6.06* 3.99*  CALCIUM 8.0* 8.3* 7.9*  PHOS 4.3 6.4* 4.2   Liver Function Tests: Recent Labs  Lab 12/21/20 0427 12/22/20 0308 12/24/20 0435 12/25/20 0326 12/26/20 0420  AST 16  --   --   --   --   ALT 22  --   --   --   --   ALKPHOS 72  --   --   --   --   BILITOT 1.0  --   --   --   --   PROT 5.6*  --   --   --   --   ALBUMIN 1.5*   < > 1.7* 1.6* 1.7*   < > = values in this interval not displayed.   No results for input(s): LIPASE, AMYLASE in the last 168 hours. No results for input(s): AMMONIA in the last 168 hours. CBC: Recent Labs  Lab 12/22/20 0308 12/23/20 0543 12/24/20 0435 12/24/20 1719 12/25/20 0326 12/26/20 0420  WBC 10.0 9.0 10.9*  --  9.6 9.8  HGB 7.1* 7.1* 6.9* 7.9* 7.6* 8.0*  HCT 22.5* 21.7* 22.1* 24.2* 23.9* 24.9*  MCV 91.8 90.4 92.5  --  91.9 89.9  PLT 213 184 203  --  177 170   Cardiac Enzymes: No results for input(s): CKTOTAL, CKMB, CKMBINDEX, TROPONINI in the last 168 hours. CBG: Recent Labs  Lab 12/25/20 1956  12/25/20 2330 12/26/20 0342 12/26/20 0805 12/26/20 1229  GLUCAP 126* 147* 86 106* 110*    Iron Studies: No results for input(s): IRON, TIBC, TRANSFERRIN, FERRITIN in the last 72 hours. Studies/Results: No results found.  Medications: Infusions:  sodium chloride Stopped (12/20/20 0955)   ceFEPime (MAXIPIME) IV 1 g (12/25/20 1726)   feeding supplement (OSMOLITE 1.5 CAL) 1,000 mL (12/25/20 1459)    Scheduled Medications:  apixaban  5 mg Per Tube BID   atorvastatin  20 mg Per Tube Daily   chlorhexidine gluconate (MEDLINE KIT)  15 mL Mouth Rinse BID   Chlorhexidine Gluconate Cloth  6 each Topical Q1200   clonazepam  1 mg Per Tube TID   feeding supplement (PROSource TF)  45 mL Per Tube TID   fentaNYL  1 patch Transdermal Q72H   fiber  1 packet Per Tube BID   free water  100 mL Per Tube Q4H   glycopyrrolate  1 mg Per Tube BID   hydrocerin   Topical BID   insulin aspart  0-15  Units Subcutaneous Q4H   insulin aspart  6 Units Subcutaneous Q4H   insulin detemir  18 Units Subcutaneous BID   levETIRAcetam  500 mg Per Tube BID   mouth rinse  15 mL Mouth Rinse QID   pantoprazole sodium  40 mg Per Tube Daily   QUEtiapine  50 mg Per Tube BID   thiamine  100 mg Per Tube Daily    have reviewed scheduled and prn medications.  Physical Exam: General:on tracheal collar, lying on bed, NAD Heart:RRR, s1s2 nl Lungs:clear b/l, no crackle Abdomen:soft, Non-tender, non-distended Extremities:b/l LE edema + with chronic venous stasis change. Dialysis Access: [facility] in place.   Thomas [name] Tanna Furry 12/26/2020,2:04 PM  LOS: 25 days

## 2020-12-26 NOTE — Progress Notes (Signed)
Physical Therapy Treatment Patient Details Name: Thomas [name] MRN: [id number] DOB: Sep 03, 1955 Today's Date: 12/26/2020    History of Present Illness 65 y.o. male present to ED 2/24 with high ostomy output. Patient admitted with ischemic ATN in setting of severe sepsis and multisystem organ failure. Hospital admission complicated by volume overload, aspiration pneumonia, acute respiratory failure, AKI and ischemic colitis. S/p trach placement 8/11. CorTrak 8/12. HD initiated 8/12. To IR 8/15 for tunneled HD cath. 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    PT Comments    Pt remains limited by pain at this time, reporting multiple areas of pain throughout body. Pt declines attempts at functional mobility, agreeing to bed-level LE exercise. Pt participates in LE exercise well, with pain limiting RLE more so than left. Pt remains profoundly weak and will benefit from continued acute PT services to improve mobility and reduce caregiver burden.  Follow Up Recommendations  SNF     Equipment Recommendations  Wheelchair (measurements PT);Wheelchair cushion (measurements PT);Hospital bed;Other (comment) (hoyer lift)    Recommendations for Other Services       Precautions / Restrictions Precautions Precautions: Fall Precaution Comments: Orthostatic hypotension; CorTrak; L fem line; ostomy bag Restrictions Weight Bearing Restrictions: No    Mobility  Bed Mobility               General bed mobility comments: pt refuses attempts at bed mobility due to pain    Transfers                    Ambulation/Gait                 Stairs             Wheelchair Mobility    Modified Rankin (Stroke Patients Only)       Balance                                             Cognition Arousal/Alertness: Awake/alert Behavior During Therapy: Flat affect Overall Cognitive Status: No family/caregiver present to determine baseline cognitive functioning                                 General Comments: pt follows one step commands well, communication is limited to non-verbal currently with trach in place.      Exercises General Exercises - Lower Extremity Ankle Circles/Pumps: AROM;Both;10 reps (R ankle ROM limited, pain with AAROM) Gluteal Sets: AROM;Both;10 reps Short Arc Quad: AROM;Both;5 reps Heel Slides: AAROM;Both;5 reps Hip ABduction/ADduction: AROM;Both;10 reps    General Comments General comments (skin integrity, edema, etc.): VSS on 8L 35% FiO2 trach collar      Pertinent Vitals/Pain Pain Assessment: Faces Faces Pain Scale: Hurts whole lot Pain Location: abdomen, R foot, head Pain Descriptors / Indicators: Grimacing Pain Intervention(s): Monitored during session    Home Living                      Prior Function            PT Goals (current goals can now be found in the care plan section) Acute Rehab PT Goals  Patient Stated Goal: to decrease pain Progress towards PT goals: Not progressing toward goals - comment (remains limited by pain)    Frequency    Min 2X/week      PT Plan Current plan remains appropriate    Co-evaluation              AM-PAC PT "6 Clicks" Mobility   Outcome Measure  Help needed turning from your back to your side while in a flat bed without using bedrails?: Total Help needed moving from lying on your back to sitting on the side of a flat bed without using bedrails?: Total Help needed moving to and from a bed to a chair (including a wheelchair)?: Total Help needed standing up from a chair using your arms (e.g., wheelchair or bedside chair)?: Total Help needed to walk in hospital room?: Total Help needed climbing 3-5 steps with a railing? :  Total 6 Click Score: 6    End of Session Equipment Utilized During Treatment: Oxygen Activity Tolerance: Patient limited by pain Patient left: in bed;with call bell/phone within reach Nurse Communication: Mobility status;Need for lift equipment PT Visit Diagnosis: Muscle weakness (generalized) (M62.81);Difficulty in walking, not elsewhere classified (R26.2)     Time: 9122-5834 PT Time Calculation (min) (ACUTE ONLY): 13 min  Charges:  $Therapeutic Exercise: 8-22 mins                     Zenaida Niece, PT, DPT Acute Rehabilitation Pager: [phone]    Zenaida Niece 12/26/2020, 9:08 AM

## 2020-12-27 ENCOUNTER — Inpatient Hospital Stay (HOSPITAL_COMMUNITY): Payer: Medicare HMO

## 2020-12-27 LAB — CBC
HCT: 25 % — ABNORMAL LOW (ref 39.0–52.0)
Hemoglobin: 8.6 g/dL — ABNORMAL LOW (ref 13.0–17.0)
MCH: 29.9 pg (ref 26.0–34.0)
MCHC: 34.4 g/dL (ref 30.0–36.0)
MCV: 86.8 fL (ref 80.0–100.0)
Platelets: 172 10*3/uL (ref 150–400)
RBC: 2.88 MIL/uL — ABNORMAL LOW (ref 4.22–5.81)
RDW: 17.6 % — ABNORMAL HIGH (ref 11.5–15.5)
WBC: 12 10*3/uL — ABNORMAL HIGH (ref 4.0–10.5)
nRBC: 0 % (ref 0.0–0.2)

## 2020-12-27 LAB — RENAL FUNCTION PANEL
Albumin: 1.8 g/dL — ABNORMAL LOW (ref 3.5–5.0)
Anion gap: 16 — ABNORMAL HIGH (ref 5–15)
BUN: 50 mg/dL — ABNORMAL HIGH (ref 8–23)
CO2: 21 mmol/L — ABNORMAL LOW (ref 22–32)
Calcium: 8.3 mg/dL — ABNORMAL LOW (ref 8.9–10.3)
Chloride: 93 mmol/L — ABNORMAL LOW (ref 98–111)
Creatinine, Ser: 5.12 mg/dL — ABNORMAL HIGH (ref 0.61–1.24)
GFR, Estimated: 12 mL/min — ABNORMAL LOW (ref >60)
Glucose, Bld: 112 mg/dL — ABNORMAL HIGH (ref 70–99)
Phosphorus: 5.2 mg/dL — ABNORMAL HIGH (ref 2.5–4.6)
Potassium: 4.7 mmol/L (ref 3.5–5.1)
Sodium: 130 mmol/L — ABNORMAL LOW (ref 135–145)

## 2020-12-27 LAB — GLUCOSE, CAPILLARY
Glucose-Capillary: 103 mg/dL — ABNORMAL HIGH (ref 70–99)
Glucose-Capillary: 187 mg/dL — ABNORMAL HIGH (ref 70–99)
Glucose-Capillary: 127 mg/dL — ABNORMAL HIGH (ref 70–99)
Glucose-Capillary: 171 mg/dL — ABNORMAL HIGH (ref 70–99)
Glucose-Capillary: 177 mg/dL — ABNORMAL HIGH (ref 70–99)

## 2020-12-27 MED ORDER — GLYCOPYRROLATE 0.2 MG/ML IJ SOLN
0.4000 mg | Freq: Once | INTRAMUSCULAR | Status: AC
  Administered 2020-12-27: 0.4 mg via INTRAVENOUS
  Filled 2020-12-27: qty 2

## 2020-12-27 MED ORDER — LIDOCAINE HCL (PF) 1 % IJ SOLN: 5.0000 mL | INTRAMUSCULAR | Status: DC | PRN

## 2020-12-27 MED ORDER — LIDOCAINE-PRILOCAINE 2.5-2.5 % EX CREA: 1.0000 "application " | TOPICAL_CREAM | CUTANEOUS | Status: DC | PRN

## 2020-12-27 MED ORDER — PENTAFLUOROPROP-TETRAFLUOROETH EX AERO: 1.0000 "application " | INHALATION_SPRAY | CUTANEOUS | Status: DC | PRN

## 2020-12-27 MED ORDER — SODIUM CHLORIDE 0.9 % IV SOLN: 100.0000 mL | INTRAVENOUS | Status: DC | PRN

## 2020-12-27 MED ORDER — HEPARIN SODIUM (PORCINE) 1000 UNIT/ML DIALYSIS: 1000.0000 [IU] | INTRAMUSCULAR | Status: DC | PRN

## 2020-12-27 MED ORDER — ALTEPLASE 2 MG IJ SOLR: 2.0000 mg | Freq: Once | INTRAMUSCULAR | Status: DC | PRN

## 2020-12-27 MED ORDER — HALOPERIDOL LACTATE 5 MG/ML IJ SOLN: 5.0000 mg | Freq: Once | INTRAMUSCULAR | Status: DC

## 2020-12-27 MED ORDER — HALOPERIDOL LACTATE 5 MG/ML IJ SOLN
5.0000 mg | Freq: Once | INTRAMUSCULAR | Status: AC
  Administered 2020-12-27: 5 mg via INTRAVENOUS
  Filled 2020-12-27: qty 1

## 2020-12-27 NOTE — Progress Notes (Addendum)
PROGRESS NOTE        PATIENT DETAILS Name: Thomas [name] Age: 65 y.o. Sex: male Date of Birth: 10-19-1955 Admit Date: 11/30/2020 Admitting Physician Renee Pain, MD TSV:[id number], Rachel Moulds, NP  Brief Narrative: Patient is a 65 y.o. male with history of colon cancer-s/p partial transverse colectomy/colostomy 09/10/2020-recently started on chemotherapy prior to this hospitalization-who presented to the hospital on 7/24 with abdominal pain, anorexia, weakness-he was thought to have sepsis syndrome-and subsequently started on antimicrobial therapy.  He was initially admitted to the intensive care unit-but stabilized and transferred to the [facility] on 7/27, unfortunately further hospital course was complicated by altered mental status/delirium-septic shock/acute hypoxic respiratory failure in the setting of aspiration pneumonia.  He was transferred to the ICU where he was intubated and did require pressor support.He had a prolonged stay in the ICU-complicated by development of AKI due to ATN requiring initiation of renal replacement therapy.  He eventually failed extubation and required tracheostomy placement.   He was transferred back to the [facility] on 8/17.   Significant events: 7/25>> SIRS-admit to ICU 7/27>> Transferred to [facility], stopped abx as sepsis resolved/ruled out 7/30>> worsening hypoxia/new aspiration-worsening renal function-transfer to ICU-intubated  8/08>>failed extubation 8/8-reintubated 8/11>> tracheostomy placement 8/17>> transferred to [facility]  Significant studies: 7/26>> Echo: EF 03-00%, grade 1 diastolic dysfunction 9/23>> CT head: Negative for bleed/acute intracranial process. 7/29>> EEG: No seizures. 8/01>> CT abdomen/pelvis: Inflammatory wall thickening/fat stranding around descending/sigmoid colon.  Airspace opacity in the right lower lobe/right middle lobe. 8/05>> bilateral lower extremity Doppler: DVT right femoral  vein, right proximal profunda vein, right popliteal vein 8/19>> CT abdomen/pelvis: Decreased bowel inflammation and fluid filling when compared to prior CT.  No SBO.  Antimicrobial therapy: Unasyn 7/30 >>7/31 Maxepime 7/31 >> 8/7 Flagyl 7/31 >>  8/7 Vancomycin 8/1 only  Anidulafungin 8/1>>8/3 Rocephin: 8/16>>8/17 Cefepime:8/17>>  Microbiology data: 7/24>> blood cultures: Negative 7/25>> urine culture: Negative 7/26>> C. difficile studies: Negative 7/31>> respiratory culture: Moderate Candida tropicalis. 8/01>> GI pathogen panel: Negative 8/15>> tracheal aspirate: Pseudomonas aeruginosa   Procedures : 7/30>> 8/11: ETT 8/11>> tracheostomy placement 8/15>> left IJ TDC  Consults: PCCM, surgery, oncology, nephrology, palliative care  DVT Prophylaxis : apixaban (ELIQUIS) tablet 5 mg    Subjective: Continues to complain of some abdominal pain-CT of the abdomen done on 8/19 does not show any major abnormalities.  He looks very frail and weak and very debilitated.  Assessment/Plan: Septic shock due to aspiration pneumonia: sepsis physiology has resolved-tracheal aspirate culture on 8/15 positive for Pseudomonas-remains on cefepime-Short course of 5 days planned.    Acute hypoxic respiratory failure due to aspiration pneumonia: Failed extubation attempt-underwent tracheostomy on 8/11.  Currently stable on trach collar-however he still continues to accumulate secretions and remains at risk for recurrent aspiration pneumonia.  PCCM following for trach care.  Dysphagia: In a setting of acute illness-respiratory failure-tracheostomy-severe deconditioning-on NG tube feedings currently-await further recommendations from SLP.  Remains NPO.    Acute metabolic encephalopathy: Continues to have waxing and waning encephalopathy-was confused last night-encephalopathy is likely due to septic shock/hypoxemia-prolonged ICU stay.  Continue Seroquel/Klonopin.   AKI on CKD stage IIIb: AKI due to  ischemic ATN-no signs of renal recovery-remains anuric-HD per nephrology.  Family/spouse aware that he is very debilitated-and unable to set up-if this continues-he will not be able to get HD as an outpatient.  Right lower extremity DVT: Continue Eliquis.  PAF with RVR: Provoked by acute illness/sepsis-maintaining sinus rhythm.  Echo with stable EF.  On Eliquis for DVT.  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 Benay Spice.  Colitis: Seen initially on CT abdomen on 8/1-was evaluated by general surgery-with recommendations to continue supportive care.  Since he continued to have abdominal pain-repeat CT was obtained on 8/19 which shows improvement in colitis-no evidence of SBO.  Unclear why he is having abdominal pain-could be due to resolving colitis/scarring/underlying colon cancer.  Continue supportive care with as needed fentanyl.  Anemia of critical illness: Hemoglobin stable at 8.0-has required PRBC transfusion.  No overt blood loss.  Continue periodic CBC monitoring.    Seizure disorder: Continue Keppra-EEG negative.  History of frontal meningioma-s/p resection 09/01/2016  HLD: Continue statin  DM-2 (A1c 8.7 on 7/25): CBG stable-continue 18 units of Levemir twice daily, 6 units of NovoLog every 4 hours and SSI.  Follow and adjust  Recent Labs    12/26/20 2355 12/27/20 0337 12/27/20 0807  GLUCAP 125* 103* 187*     History of diabetic neuropathy: Resume Neurontin when able.  GERD: Continue PPI  Capecitabine induced desquamation/Hand/foot syndrome:Continue moisturizer to hands and feet and scrotum  Severe debility/deconditioning/dysphagia: Due to acute illness-continue PT/OT/SLP follow-up-likely will require SNF on discharge.  Palliative care: Full code for now-Long discussion with spouse on 8/20-she is aware of his tenuous clinical  condition and the potential for deterioration-worried that he may aspirate again given waxing and waning encephalopathy.  He is severely debilitated.  Will ask palliative care to reengage with family for continued goals of care discussion.  Nutrition Status: Nutrition Problem: Moderate Malnutrition Etiology: chronic illness, cancer and cancer related treatments Signs/Symptoms: mild fat depletion, mild muscle depletion, percent weight loss Percent weight loss: 11 % Interventions: Tube feeding, Prostat  Pressure injury: Pressure Injury 12/01/20 Coccyx Mid Stage 2 -  Partial thickness loss of dermis presenting as a shallow open injury with a red, pink wound bed without slough. stage 2 decubitus to coxxyx (Active)  12/01/20 0900  Location: Coccyx  Location Orientation: Mid  Staging: Stage 2 -  Partial thickness loss of dermis presenting as a shallow open injury with a red, pink wound bed without slough.  Wound Description (Comments): stage 2 decubitus to coxxyx  Present on Admission: Yes     Pressure Injury 12/14/20 Face Upper;Mid Deep Tissue Pressure Injury - Purple or maroon localized area of discolored intact skin or blood-filled blister due to damage of underlying soft tissue from pressure and/or shear. circular shape broken area & sloug (Active)  12/14/20 0100  Location: Face  Location Orientation: Upper;Mid  Staging: Deep Tissue Pressure Injury - Purple or maroon localized area of discolored intact skin or blood-filled blister due to damage of underlying soft tissue from pressure and/or shear.  Wound Description (Comments): circular shape broken area & sloughing skin  Present on Admission: No     Pressure Injury 12/14/20 Face Left;Upper Deep Tissue Pressure Injury - Purple or maroon localized area of discolored intact skin or blood-filled blister due to damage of underlying soft tissue from pressure and/or shear. circular shape broken area & slou (Active)  12/14/20 0100  Location: Face   Location Orientation: Left;Upper  Staging: Deep Tissue Pressure Injury - Purple or maroon localized area of discolored intact skin or blood-filled blister due to damage of underlying soft tissue from pressure and/or shear.  Wound Description (Comments): circular shape broken area & sloughing skin  Present on Admission: No     Pressure Injury 12/23/20 Buttocks Left Stage 2 -  Partial thickness loss of dermis presenting as a shallow open injury with a red, pink wound bed without slough. left buttock stage 2 (Active)  12/23/20   Location: Buttocks  Location Orientation: Left  Staging: Stage 2 -  Partial thickness loss of dermis presenting as a shallow open injury with a red, pink wound bed without slough.  Wound Description (Comments): left buttock stage 2  Present on Admission: Yes   Obesity: Estimated body mass index is 36.14 kg/m as calculated from the following:   Height as of this encounter: 5' 8"  (1.727 m).   Weight as of this encounter: 107.8 kg.     Diet: Diet Order             Diet NPO time specified  Diet effective midnight                    Code Status: Full code   Family Communication: Spouse Mahalia [phone] updated on 8/20   Status is: Inpatient  Remains inpatient appropriate because:Inpatient level of care appropriate due to severity of illness  Dispo: The patient is from: Home              Anticipated d/c is to: SNF              Patient currently is not medically stable to d/c.   Difficult to place patient No   Barriers to Discharge: N.p.o.-NG tube in place-resolving pneumonia-on IV cefepime-severely deconditioned-not yet stable for discharge.  Antimicrobial agents: Anti-infectives (From admission, onward)    Start     Dose/Rate Route Frequency Ordered Stop   12/24/20 1730  ceFEPIme (MAXIPIME) 1 g in sodium chloride 0.9 % 100 mL IVPB        1 g 200 mL/hr over 30 Minutes Intravenous Every 24 hours 12/24/20 1642 12/29/20 1729    12/23/20 0845  cefTRIAXone (ROCEPHIN) 2 g in sodium chloride 0.9 % 100 mL IVPB  Status:  Discontinued        2 g 200 mL/hr over 30 Minutes Intravenous Every 24 hours 12/23/20 0800 12/24/20 1620   12/22/20 0600  ceFAZolin (ANCEF) IVPB 2g/100 mL premix        2 g 200 mL/hr over 30 Minutes Intravenous To Radiology 12/21/20 1503 12/22/20 1149   12/09/20 1800  vancomycin (VANCOREADY) IVPB 1750 mg/350 mL  Status:  Discontinued        1,750 mg 175 mL/hr over 120 Minutes Intravenous Every 24 hours 12/08/20 1357 12/09/20 1403   12/09/20 1700  anidulafungin (ERAXIS) 100 mg in sodium chloride 0.9 % 100 mL IVPB  Status:  Discontinued        100 mg 78 mL/hr over 100 Minutes Intravenous Every 24 hours 12/08/20 1359 12/09/20 1321   12/08/20 1500  vancomycin (VANCOCIN) 2,500 mg in sodium chloride 0.9 % 500 mL IVPB        2,500 mg 250 mL/hr over 120 Minutes Intravenous  Once 12/08/20 1357 12/08/20 1719   12/08/20 1500  anidulafungin (ERAXIS) 200 mg in sodium chloride 0.9 % 200 mL IVPB        200 mg 78 mL/hr over 200 Minutes Intravenous  Once 12/08/20 1359 12/08/20 1821   12/08/20 0000  ceFEPIme (MAXIPIME) 2 g in sodium chloride 0.9 % 100 mL IVPB  Status:  Discontinued  2 g 200 mL/hr over 30 Minutes Intravenous Every 12 hours 12/07/20 1200 12/14/20 0858   12/07/20 1000  metroNIDAZOLE (FLAGYL) IVPB 500 mg  Status:  Discontinued        500 mg 100 mL/hr over 60 Minutes Intravenous Every 12 hours 12/07/20 0849 12/14/20 0858   12/07/20 0900  ceFEPIme (MAXIPIME) 2 g in sodium chloride 0.9 % 100 mL IVPB  Status:  Discontinued        2 g 200 mL/hr over 30 Minutes Intravenous Every 24 hours 12/07/20 0809 12/07/20 1200   12/07/20 0830  metroNIDAZOLE (FLAGYL) IVPB 500 mg  Status:  Discontinued        500 mg 100 mL/hr over 60 Minutes Intravenous Every 8 hours 12/07/20 0758 12/07/20 0849   12/06/20 0900  Ampicillin-Sulbactam (UNASYN) 3 g in sodium chloride 0.9 % 100 mL IVPB  Status:  Discontinued        3  g 200 mL/hr over 30 Minutes Intravenous Every 12 hours 12/06/20 0832 12/07/20 0757   12/03/20 0800  ceFEPIme (MAXIPIME) 2 g in sodium chloride 0.9 % 100 mL IVPB  Status:  Discontinued        2 g 200 mL/hr over 30 Minutes Intravenous Every 8 hours 12/03/20 0746 12/04/20 1004   12/01/20 1000  ceFEPIme (MAXIPIME) 2 g in sodium chloride 0.9 % 100 mL IVPB  Status:  Discontinued        2 g 200 mL/hr over 30 Minutes Intravenous Every 12 hours 12/01/20 0513 12/03/20 0746   12/01/20 0300  vancomycin (VANCOREADY) IVPB 2000 mg/400 mL        2,000 mg 200 mL/hr over 120 Minutes Intravenous  Once 12/01/20 0255 12/01/20 0528   11/30/20 2245  ceFEPIme (MAXIPIME) 2 g in sodium chloride 0.9 % 100 mL IVPB        2 g 200 mL/hr over 30 Minutes Intravenous STAT 11/30/20 2237 12/01/20 0003        Time spent: 35 minutes-Greater than 50% of this time was spent in counseling, explanation of diagnosis, planning of further management, and coordination of care.  MEDICATIONS: Scheduled Meds:  apixaban  5 mg Per Tube BID   atorvastatin  20 mg Per Tube Daily   chlorhexidine gluconate (MEDLINE KIT)  15 mL Mouth Rinse BID   Chlorhexidine Gluconate Cloth  6 each Topical Q1200   Chlorhexidine Gluconate Cloth  6 each Topical Q0600   clonazepam  1 mg Per Tube TID   feeding supplement (PROSource TF)  45 mL Per Tube TID   fentaNYL  1 patch Transdermal Q72H   fiber  1 packet Per Tube BID   free water  100 mL Per Tube Q4H   glycopyrrolate  1 mg Per Tube BID   hydrocerin   Topical BID   insulin aspart  0-15 Units Subcutaneous Q4H   insulin aspart  6 Units Subcutaneous Q4H   insulin detemir  18 Units Subcutaneous BID   levETIRAcetam  500 mg Per Tube BID   mouth rinse  15 mL Mouth Rinse QID   pantoprazole sodium  40 mg Per Tube Daily   QUEtiapine  50 mg Per Tube BID   thiamine  100 mg Per Tube Daily   Continuous Infusions:  sodium chloride Stopped (12/20/20 0955)   ceFEPime (MAXIPIME) IV 1 g (12/26/20 1630)    feeding supplement (OSMOLITE 1.5 CAL) 45 mL/hr at 12/27/20 0927   PRN Meds:.sodium chloride, acetaminophen, albuterol, dextrose, docusate, fentaNYL (SUBLIMAZE) injection, heparin sodium (porcine), sodium chloride  flush   PHYSICAL EXAM: Vital signs: Vitals:   12/27/20 0400 12/27/20 0600 12/27/20 0803 12/27/20 0805  BP:   119/66   Pulse: 98 (!) 101 (!) 111   Resp: (!) 28 (!) 28 (!) 22   Temp:   98.1 F (36.7 C)   TempSrc:   Oral   SpO2: 100% 97% 97% 98%  Weight:      Height:       Filed Weights   12/25/20 1107 12/26/20 0425 12/27/20 0334  Weight: 105.4 kg 107.8 kg 107.8 kg   Body mass index is 36.14 kg/m.   Gen Exam: Frail-debilitated-looks chronically sick.  Follow simple commands.  HEENT:atraumatic, normocephalic Chest: B/L clear to auscultation anteriorly-but has transmitted upper airway sounds CVS:S1S2 regular Abdomen: Soft-mildly tender in the upper abdomen without any peritoneal signs. Extremities:no edema Neurology: Moves all 4 extremities. Skin: no rash   I have personally reviewed following labs and imaging studies  LABORATORY DATA: CBC: Recent Labs  Lab 12/23/20 0543 12/24/20 0435 12/24/20 1719 12/25/20 0326 12/26/20 0420 12/27/20 0444  WBC 9.0 10.9*  --  9.6 9.8 12.0*  HGB 7.1* 6.9* 7.9* 7.6* 8.0* 8.6*  HCT 21.7* 22.1* 24.2* 23.9* 24.9* 25.0*  MCV 90.4 92.5  --  91.9 89.9 86.8  PLT 184 203  --  177 170 172     Basic Metabolic Panel: Recent Labs  Lab 12/23/20 1026 12/24/20 0435 12/25/20 0326 12/26/20 0420 12/27/20 0444  NA 138 140 141 133* 130*  K 3.8 3.8 4.2 3.9 4.7  CL 103 108 106 97* 93*  CO2 23 21* 19* 25 21*  GLUCOSE 136* 108* 148* 88 112*  BUN 37* 49* 57* 34* 50*  CREATININE 3.90* 5.02* 6.06* 3.99* 5.12*  CALCIUM 7.9* 8.0* 8.3* 7.9* 8.3*  PHOS 3.4 4.3 6.4* 4.2 5.2*     GFR: Estimated Creatinine Clearance: 17.4 mL/min (A) (by C-G formula based on SCr of 5.12 mg/dL (H)).  Liver Function Tests: Recent Labs  Lab 12/21/20 0427  12/22/20 0308 12/23/20 1026 12/24/20 0435 12/25/20 0326 12/26/20 0420 12/27/20 0444  AST 16  --   --   --   --   --   --   ALT 22  --   --   --   --   --   --   ALKPHOS 72  --   --   --   --   --   --   BILITOT 1.0  --   --   --   --   --   --   PROT 5.6*  --   --   --   --   --   --   ALBUMIN 1.5*   < > 1.7* 1.7* 1.6* 1.7* 1.8*   < > = values in this interval not displayed.    No results for input(s): LIPASE, AMYLASE in the last 168 hours. No results for input(s): AMMONIA in the last 168 hours.  Coagulation Profile: No results for input(s): INR, PROTIME in the last 168 hours.  Cardiac Enzymes: No results for input(s): CKTOTAL, CKMB, CKMBINDEX, TROPONINI in the last 168 hours.  BNP (last 3 results) No results for input(s): PROBNP in the last 8760 hours.  Lipid Profile: No results for input(s): CHOL, HDL, LDLCALC, TRIG, CHOLHDL, LDLDIRECT in the last 72 hours.  Thyroid Function Tests: No results for input(s): TSH, T4TOTAL, FREET4, T3FREE, THYROIDAB in the last 72 hours.  Anemia Panel: No results for input(s): VITAMINB12, FOLATE, FERRITIN, TIBC, IRON,  RETICCTPCT in the last 72 hours.  Urine analysis:    Component Value Date/Time   COLORURINE YELLOW 12/01/2020 0200   APPEARANCEUR CLEAR 12/01/2020 0200   LABSPEC 1.023 12/01/2020 0200   PHURINE 5.0 12/01/2020 0200   GLUCOSEU >=500 (A) 12/01/2020 0200   HGBUR NEGATIVE 12/01/2020 0200   BILIRUBINUR NEGATIVE 12/01/2020 0200   KETONESUR NEGATIVE 12/01/2020 0200   PROTEINUR 100 (A) 12/01/2020 0200   UROBILINOGEN 0.2 09/27/2014 0301   NITRITE NEGATIVE 12/01/2020 0200   LEUKOCYTESUR NEGATIVE 12/01/2020 0200    Sepsis Labs: Lactic Acid, Venous    Component Value Date/Time   LATICACIDVEN 2.1 ([place]) 12/10/2020 0412    MICROBIOLOGY: Recent Results (from the past 240 hour(s))  Culture, Respiratory w Gram Stain     Status: None   Collection Time: 12/22/20 10:13 AM   Specimen: Tracheal Aspirate; Respiratory  Result Value  Ref Range Status   Specimen Description TRACHEAL ASPIRATE  Final   Special Requests NONE  Final   Gram Stain   Final    FEW SQUAMOUS EPITHELIAL CELLS PRESENT FEW WBC SEEN ABUNDANT GRAM POSITIVE COCCI FEW GRAM VARIABLE ROD Performed at [facility], [place] [address], [place], [place] [address]    Culture RARE PSEUDOMONAS AERUGINOSA  Final   Report Status 12/26/2020 FINAL  Final   Organism ID, Bacteria PSEUDOMONAS AERUGINOSA  Final      Susceptibility   Pseudomonas aeruginosa - MIC*    CEFTAZIDIME <=1 SENSITIVE Sensitive     GENTAMICIN <=1 SENSITIVE Sensitive     PIP/TAZO 8 SENSITIVE Sensitive     * RARE PSEUDOMONAS AERUGINOSA    RADIOLOGY STUDIES/RESULTS: CT ABDOMEN PELVIS WO CONTRAST  Result Date: 12/27/2020 CLINICAL DATA:  Acute nonlocalized abdominal pain EXAM: CT ABDOMEN AND PELVIS WITHOUT CONTRAST TECHNIQUE: Multidetector CT imaging of the abdomen and pelvis was performed following the standard protocol without IV contrast. COMPARISON:  12/08/2020 FINDINGS: Lower chest: Streaky opacity in the lower lobes, improved on the right and new on the left. Central line with tip seen at the right atrium. Hepatobiliary: No focal liver abnormality.No evidence of biliary obstruction or stone. Pancreas: Unremarkable. Spleen: Unremarkable. Adrenals/Urinary Tract: Negative adrenals. No hydronephrosis or stone. 3.4 cm left renal cystic density. Symmetric perinephric stranding which is nonspecific. Distended bladder without superimposed abnormality. Stomach/Bowel: Partial colectomy . Enteric contrast reaches the transverse colon where it has a somewhat eccentric appearance relative to the central colon, but no perforation or inflammation. Small bowel loops and excluded distal colon have decompressed since prior with diminished fat inflammation. A feeding tube tip is at the distal stomach. Vascular/Lymphatic: No acute vascular abnormality. Atheromatous calcifications. No mass or adenopathy.  Reproductive:No acute finding Other: No ascites or pneumoperitoneum. Fatty bilateral groin hernia. Musculoskeletal: No acute abnormalities. IMPRESSION: 1. Decreased bowel inflammation and fluid filling when compared to CT earlier this month. Negative for obstruction. 2. Lower lobe atelectasis, progressed on the left since prior. 3. Chronic findings are noted above. Electronically Signed   By: Monte Fantasia M.D.   On: 12/27/2020 04:23   DG Chest Port 1 View  Result Date: 12/27/2020 CLINICAL DATA:  Dyspnea.  Hypoglycemia EXAM: PORTABLE CHEST 1 VIEW COMPARISON:  12/18/2020 FINDINGS: Patient rotated right. Tracheostomy appropriately position. Right sided Port-A-Cath tip at superior caval atrial junction. Interval placement of a left-sided dialysis catheter with tip at low SVC. Mild cardiomegaly. Mild right hemidiaphragm elevation. No pleural effusion or pneumothorax. Low lung volumes with resultant pulmonary interstitial prominence. No congestive failure. No lobar consolidation. IMPRESSION: Interval placement  of a left-sided dialysis catheter, without pneumothorax or other acute complication. Cardiomegaly, without congestive failure or acute disease. Electronically Signed   By: Abigail Miyamoto M.D.   On: 12/27/2020 06:34     LOS: 26 days   Oren Binet, MD  [facility]    To contact the attending provider between 7A-7P or the covering provider during after hours 7P-7A, please log into the web site [email or web address] and access using universal [place] password for that web site. If you do not have the password, please call the hospital operator.  12/27/2020, 11:05 AM

## 2020-12-27 NOTE — Progress Notes (Signed)
Transitions of Care Team following this patient for discharge planning.  Currently, patient not medically stable; will continue to follow as patient progresses.    Shawntay Prest LCSW

## 2020-12-27 NOTE — Progress Notes (Signed)
[facility] night shift PCU coverage note.  The patient has been more restless this morning.  He was pointing towards his abdomen and upper chest due to pain.  He received fentanyl 100 mcg earlier.  On auscultation he is S1-S2, RRR with good air entry bilaterally with mild upper rhonchi.  His abdomen is soft, but tender in epigastric area.  Ostomy in place.   I have asked the staff to place the hand mitts to avoid the patient accidentally pulling out his tracheostomy or IV catheters.  Haloperidol 5 mg IVP x1, glycopyrrolate 0.4 mg IVP x1, chest radiograph and EKG ordered.  The staff will also let RT know to provide him with an albuterol nebulizer treatment.  He may benefit from a longer acting opioid for breakthrough pain as he may not be getting lasting relief from the fentanyl.  Tennis Must, MD.

## 2020-12-27 NOTE — Progress Notes (Signed)
Md Ortiz,David notified of patient's agitation, low blood sugar(63),pain and  increase secretions via secure messaging and via telephone. Md placed orders. Refer to [facility].

## 2020-12-27 NOTE — Progress Notes (Signed)
[facility] NEPHROLOGY PROGRESS NOTE  Assessment/ Plan:  # AKI/CKD stage IIIb - ischemic ATN in setting of severe sepsis and multisystem organ failure.  Admission creatinine was 2.1 and earlier on CRRT (stopped after 8/9) and transitioned to IHD-underwent left IJ TDC placement on 8/15.  Remains anuric and getting dialysis intermittently.  Plan for regular dialysis today. He is not able to sit on chair or recliner and looks very debilitated.  Noted palliative care team has seen patient during this hospitalization.  I recommend to reconsult palliative care team.  # Altered mental status-with prior history of CVA in 2015 and seizure disorder,.   # Volume overload-with continued diarrhea/stool losses and ultrafiltration with hemodialysis.  # Sepsis/aspiration pneumonia -hemodynamic status improved/off pressors and has completed the course of  IV cefepime and vancomycin. Back on cefepime now.   # Chronic hypoxic respiratory failure -status post tracheostomy and ongoing supplementation via T-bar.  # Stage IIIB colon cancer - sp partial colectomy, colostomy. On chemoRx sp 3 cycles.  # Right leg DVT - anticoagulation per primary team.  # Atrial fibrillation - on Eliquis, rate controlled.  # Anemia of critical illness: Continue to monitor hemoglobin/hematocrit trend-holding erythropoietin with active malignancy.  Discussed with primary team.   Subjective: Lying on bed.  Opens eyes with the name but looks very lethargic.  No other new event. Objective Vital signs in last 24 hours: Vitals:   12/27/20 0600 12/27/20 0803 12/27/20 0805 12/27/20 1125  BP:  119/66    Pulse: (!) 101 (!) 111    Resp: (!) 28 (!) 22    Temp:  98.1 F (36.7 C)    TempSrc:  Oral    SpO2: 97% 97% 98% 100%  Weight:      Height:       Weight change: -0.8 kg  Intake/Output Summary (Last 24 hours) at 12/27/2020 1147 Last data filed at 12/27/2020 0805 Gross per 24 hour  Intake --  Output 800 ml  Net  -800 ml       Labs: Basic Metabolic Panel: Recent Labs  Lab 12/25/20 0326 12/26/20 0420 12/27/20 0444  NA 141 133* 130*  K 4.2 3.9 4.7  CL 106 97* 93*  CO2 19* 25 21*  GLUCOSE 148* 88 112*  BUN 57* 34* 50*  CREATININE 6.06* 3.99* 5.12*  CALCIUM 8.3* 7.9* 8.3*  PHOS 6.4* 4.2 5.2*    Liver Function Tests: Recent Labs  Lab 12/21/20 0427 12/22/20 0308 12/25/20 0326 12/26/20 0420 12/27/20 0444  AST 16  --   --   --   --   ALT 22  --   --   --   --   ALKPHOS 72  --   --   --   --   BILITOT 1.0  --   --   --   --   PROT 5.6*  --   --   --   --   ALBUMIN 1.5*   < > 1.6* 1.7* 1.8*   < > = values in this interval not displayed.    No results for input(s): LIPASE, AMYLASE in the last 168 hours. No results for input(s): AMMONIA in the last 168 hours. CBC: Recent Labs  Lab 12/23/20 0543 12/24/20 0435 12/24/20 1719 12/25/20 0326 12/26/20 0420 12/27/20 0444  WBC 9.0 10.9*  --  9.6 9.8 12.0*  HGB 7.1* 6.9*   < > 7.6* 8.0* 8.6*  HCT 21.7* 22.1*   < > 23.9* 24.9* 25.0*  MCV  90.4 92.5  --  91.9 89.9 86.8  PLT 184 203  --  177 170 172   < > = values in this interval not displayed.    Cardiac Enzymes: No results for input(s): CKTOTAL, CKMB, CKMBINDEX, TROPONINI in the last 168 hours. CBG: Recent Labs  Lab 12/26/20 2017 12/26/20 2034 12/26/20 2355 12/27/20 0337 12/27/20 0807  GLUCAP 64* 83 125* 103* 187*     Iron Studies: No results for input(s): IRON, TIBC, TRANSFERRIN, FERRITIN in the last 72 hours. Studies/Results: CT ABDOMEN PELVIS WO CONTRAST  Result Date: 12/27/2020 CLINICAL DATA:  Acute nonlocalized abdominal pain EXAM: CT ABDOMEN AND PELVIS WITHOUT CONTRAST TECHNIQUE: Multidetector CT imaging of the abdomen and pelvis was performed following the standard protocol without IV contrast. COMPARISON:  12/08/2020 FINDINGS: Lower chest: Streaky opacity in the lower lobes, improved on the right and new on the left. Central line with tip seen at the right  atrium. Hepatobiliary: No focal liver abnormality.No evidence of biliary obstruction or stone. Pancreas: Unremarkable. Spleen: Unremarkable. Adrenals/Urinary Tract: Negative adrenals. No hydronephrosis or stone. 3.4 cm left renal cystic density. Symmetric perinephric stranding which is nonspecific. Distended bladder without superimposed abnormality. Stomach/Bowel: Partial colectomy . Enteric contrast reaches the transverse colon where it has a somewhat eccentric appearance relative to the central colon, but no perforation or inflammation. Small bowel loops and excluded distal colon have decompressed since prior with diminished fat inflammation. A feeding tube tip is at the distal stomach. Vascular/Lymphatic: No acute vascular abnormality. Atheromatous calcifications. No mass or adenopathy. Reproductive:No acute finding Other: No ascites or pneumoperitoneum. Fatty bilateral groin hernia. Musculoskeletal: No acute abnormalities. IMPRESSION: 1. Decreased bowel inflammation and fluid filling when compared to CT earlier this month. Negative for obstruction. 2. Lower lobe atelectasis, progressed on the left since prior. 3. Chronic findings are noted above. Electronically Signed   By: Monte Fantasia M.D.   On: 12/27/2020 04:23   DG Chest Port 1 View  Result Date: 12/27/2020 CLINICAL DATA:  Dyspnea.  Hypoglycemia EXAM: PORTABLE CHEST 1 VIEW COMPARISON:  12/18/2020 FINDINGS: Patient rotated right. Tracheostomy appropriately position. Right sided Port-A-Cath tip at superior caval atrial junction. Interval placement of a left-sided dialysis catheter with tip at low SVC. Mild cardiomegaly. Mild right hemidiaphragm elevation. No pleural effusion or pneumothorax. Low lung volumes with resultant pulmonary interstitial prominence. No congestive failure. No lobar consolidation. IMPRESSION: Interval placement of a left-sided dialysis catheter, without pneumothorax or other acute complication. Cardiomegaly, without congestive  failure or acute disease. Electronically Signed   By: Abigail Miyamoto M.D.   On: 12/27/2020 06:34    Medications: Infusions:  sodium chloride Stopped (12/20/20 0955)   sodium chloride     sodium chloride     ceFEPime (MAXIPIME) IV 1 g (12/26/20 1630)   feeding supplement (OSMOLITE 1.5 CAL) 45 mL/hr at 12/27/20 2694    Scheduled Medications:  apixaban  5 mg Per Tube BID   atorvastatin  20 mg Per Tube Daily   chlorhexidine gluconate (MEDLINE KIT)  15 mL Mouth Rinse BID   Chlorhexidine Gluconate Cloth  6 each Topical Q1200   Chlorhexidine Gluconate Cloth  6 each Topical Q0600   clonazepam  1 mg Per Tube TID   feeding supplement (PROSource TF)  45 mL Per Tube TID   fentaNYL  1 patch Transdermal Q72H   fiber  1 packet Per Tube BID   free water  100 mL Per Tube Q4H   glycopyrrolate  1 mg Per Tube BID   hydrocerin  Topical BID   insulin aspart  0-15 Units Subcutaneous Q4H   insulin aspart  6 Units Subcutaneous Q4H   insulin detemir  18 Units Subcutaneous BID   levETIRAcetam  500 mg Per Tube BID   mouth rinse  15 mL Mouth Rinse QID   pantoprazole sodium  40 mg Per Tube Daily   QUEtiapine  50 mg Per Tube BID   thiamine  100 mg Per Tube Daily    have reviewed scheduled and prn medications.  Physical Exam: General:on tracheal collar with increased secretions, lying on bed opens eyes with the name. Heart:RRR, s1s2 nl Lungs:clear b/l, no crackle Abdomen:soft, Non-tender, non-distended Extremities:b/l LE edema + with chronic venous stasis change. Dialysis Access: [facility] in place.    Prasad  12/27/2020,11:47 AM  LOS: 26 days

## 2020-12-27 NOTE — Plan of Care (Signed)
  Problem: Education: Goal: Knowledge of General Education information will improve Description Including pain rating scale, medication(s)/side effects and non-pharmacologic comfort measures Outcome: Progressing   Problem: Health Behavior/Discharge Planning: Goal: Ability to manage health-related needs will improve Outcome: Progressing   

## 2020-12-27 NOTE — Progress Notes (Signed)
BP 111/81 (BP Location: Right Arm)   Pulse (!) 111   Temp 98.5 F (36.9 C) (Oral)   Resp (!) 21   Ht 5\' 8"  (1.727 m)   Wt 104.8 kg   SpO2 99%   BMI 35.13 kg/m   Pt returned from HD.

## 2020-12-27 NOTE — Progress Notes (Signed)
Pt transferred to HD via bed with nurse transport.

## 2020-12-27 NOTE — Progress Notes (Signed)
Patient became more agitated and md notified via secure messaging. Patient pointed at chest and abdomen and complained of severe pain. Md Tennis Must) ordered haldol and portable chest xray.

## 2020-12-28 LAB — CBC
HCT: 25.5 % — ABNORMAL LOW (ref 39.0–52.0)
Hemoglobin: 8.3 g/dL — ABNORMAL LOW (ref 13.0–17.0)
MCH: 28.9 pg (ref 26.0–34.0)
MCHC: 32.5 g/dL (ref 30.0–36.0)
MCV: 88.9 fL (ref 80.0–100.0)
Platelets: 182 10*3/uL (ref 150–400)
RBC: 2.87 MIL/uL — ABNORMAL LOW (ref 4.22–5.81)
RDW: 17.5 % — ABNORMAL HIGH (ref 11.5–15.5)
WBC: 11.8 10*3/uL — ABNORMAL HIGH (ref 4.0–10.5)
nRBC: 0 % (ref 0.0–0.2)

## 2020-12-28 LAB — GLUCOSE, CAPILLARY
Glucose-Capillary: 125 mg/dL — ABNORMAL HIGH (ref 70–99)
Glucose-Capillary: 129 mg/dL — ABNORMAL HIGH (ref 70–99)
Glucose-Capillary: 140 mg/dL — ABNORMAL HIGH (ref 70–99)
Glucose-Capillary: 155 mg/dL — ABNORMAL HIGH (ref 70–99)
Glucose-Capillary: 166 mg/dL — ABNORMAL HIGH (ref 70–99)
Glucose-Capillary: 176 mg/dL — ABNORMAL HIGH (ref 70–99)

## 2020-12-28 LAB — RENAL FUNCTION PANEL
Albumin: 1.9 g/dL — ABNORMAL LOW (ref 3.5–5.0)
Anion gap: 12 (ref 5–15)
BUN: 26 mg/dL — ABNORMAL HIGH (ref 8–23)
CO2: 24 mmol/L (ref 22–32)
Calcium: 8.2 mg/dL — ABNORMAL LOW (ref 8.9–10.3)
Chloride: 93 mmol/L — ABNORMAL LOW (ref 98–111)
Creatinine, Ser: 3.4 mg/dL — ABNORMAL HIGH (ref 0.61–1.24)
GFR, Estimated: 19 mL/min — ABNORMAL LOW (ref >60)
Glucose, Bld: 144 mg/dL — ABNORMAL HIGH (ref 70–99)
Phosphorus: 5.2 mg/dL — ABNORMAL HIGH (ref 2.5–4.6)
Potassium: 3.8 mmol/L (ref 3.5–5.1)
Sodium: 129 mmol/L — ABNORMAL LOW (ref 135–145)

## 2020-12-28 NOTE — Progress Notes (Signed)
[facility]GRESS [facility]  Assessment/ Plan:  # AKI/CKD stage IIIb - ischemic ATN in setting of severe sepsis and multisystem organ failure.  Admission creatinine was 2.1 and earlier on CRRT (stopped after 8/9) and transitioned to IHD-underwent left IJ TDC placement on 8/15.  Remains anuric and getting dialysis intermittently.  Last dialysis on 8/20 with 3 L ultrafiltration.  He is not able to sit on chair or recliner and looks very debilitated.  Noted palliative care team has seen patient during this hospitalization. Apparently family wanted to continue current full scope of treatment. I recommend to reconsult palliative care team since there is no meaningful clinical improvement. Plan for next dialysis on 8/23.    # Altered mental status-with prior history of CVA in 2015 and seizure disorder,.   # Volume overload-with continued diarrhea/stool losses and ultrafiltration with hemodialysis.  # Sepsis/aspiration pneumonia -hemodynamic status improved/off pressors and has completed the course of  IV cefepime and vancomycin. Back on cefepime now.   # Chronic hypoxic respiratory failure -status post tracheostomy and ongoing supplementation via T-bar.  # Stage IIIB colon cancer - sp partial colectomy, colostomy. On chemoRx sp 3 cycles.  # Right leg DVT - anticoagulation per primary team.  # Atrial fibrillation - on Eliquis, rate controlled.  # Anemia of critical illness: Continue to monitor hemoglobin/hematocrit trend-holding erythropoietin with active malignancy.  Discussed with primary team.   Subjective: Lying on bed.  Had dialysis yesterday.  He is only able to open eyes but not really participating in conversation or following commands.  Review of system is limited. Objective Vital signs in last 24 hours: Vitals:   12/28/20 0831 12/28/20 1145 12/28/20 1206 12/28/20 1249  BP:    111/77  Pulse: 96   96  Resp: (!) _0 Temp:    97.6 F (36.4 C)   TempSrc:    Oral  SpO2: 98% 95% 96% 97%  Weight:      Height:       Weight change: 0 kg  Intake/Output Summary (Last 24 hours) at 12/28/2020 1320 Last data filed at 12/28/2020 0900 Gross per 24 hour  Intake 600 ml  Output 3158 ml  Net -2558 ml        Labs: Basic Metabolic Panel: Recent Labs  Lab 12/26/20 0420 12/27/20 0444 12/28/20 0334  NA 133* 130* 129*  K 3.9 4.7 3.8  CL 97* 93* 93*  CO2 25 21* 24  GLUCOSE 88 112* 144*  BUN 34* 50* 26*  CREATININE 3.99* 5.12* 3.40*  CALCIUM 7.9* 8.3* 8.2*  PHOS 4.2 5.2* 5.2*    Liver Function Tests: Recent Labs  Lab 12/26/20 0420 12/27/20 0444 12/28/20 0334  ALBUMIN 1.7* 1.8* 1.9*    No results for input(s): LIPASE, AMYLASE in the last 168 hours. No results for input(s): AMMONIA in the last 168 hours. CBC: Recent Labs  Lab 12/24/20 0435 12/24/20 1719 12/25/20 0326 12/26/20 0420 12/27/20 0444 12/28/20 0334  WBC 10.9*  --  9.6 9.8 12.0* 11.8*  HGB 6.9*   < > 7.6* 8.0* 8.6* 8.3*  HCT 22.1*   < > 23.9* 24.9* 25.0* 25.5*  MCV 92.5  --  91.9 89.9 86.8 88.9  PLT 203  --  177 170 172 182   < > = values in this interval not displayed.    Cardiac Enzymes: No results for input(s): CKTOTAL, CKMB, CKMBINDEX, TROPONINI in the last 168 hours. CBG: Recent Labs  Lab 12/27/20 2106 12/28/20 0013  12/28/20 0503 12/28/20 0801 12/28/20 1251  GLUCAP 177* 155* 176* 140* 166*     Iron Studies: No results for input(s): IRON, TIBC, TRANSFERRIN, FERRITIN in the last 72 hours. Studies/Results: CT ABDOMEN PELVIS WO CONTRAST  Result Date: 12/27/2020 CLINICAL DATA:  Acute nonlocalized abdominal pain EXAM: CT ABDOMEN AND PELVIS WITHOUT CONTRAST TECHNIQUE: Multidetector CT imaging of the abdomen and pelvis was performed following the standard protocol without IV contrast. COMPARISON:  12/08/2020 FINDINGS: Lower chest: Streaky opacity in the lower lobes, improved on the right and new on the left. Central line with tip seen at the  right atrium. Hepatobiliary: No focal liver abnormality.No evidence of biliary obstruction or stone. Pancreas: Unremarkable. Spleen: Unremarkable. Adrenals/Urinary Tract: Negative adrenals. No hydronephrosis or stone. 3.4 cm left renal cystic density. Symmetric perinephric stranding which is nonspecific. Distended bladder without superimposed abnormality. Stomach/Bowel: Partial colectomy . Enteric contrast reaches the transverse colon where it has a somewhat eccentric appearance relative to the central colon, but no perforation or inflammation. Small bowel loops and excluded distal colon have decompressed since prior with diminished fat inflammation. A feeding tube tip is at the distal stomach. Vascular/Lymphatic: No acute vascular abnormality. Atheromatous calcifications. No mass or adenopathy. Reproductive:No acute finding Other: No ascites or pneumoperitoneum. Fatty bilateral groin hernia. Musculoskeletal: No acute abnormalities. IMPRESSION: 1. Decreased bowel inflammation and fluid filling when compared to CT earlier this month. Negative for obstruction. 2. Lower lobe atelectasis, progressed on the left since prior. 3. Chronic findings are noted above. Electronically Signed   By: Monte Fantasia M.D.   On: 12/27/2020 04:23   DG Chest Port 1 View  Result Date: 12/27/2020 CLINICAL DATA:  Dyspnea.  Hypoglycemia EXAM: PORTABLE CHEST 1 VIEW COMPARISON:  12/18/2020 FINDINGS: Patient rotated right. Tracheostomy appropriately position. Right sided Port-A-Cath tip at superior caval atrial junction. Interval placement of a left-sided dialysis catheter with tip at low SVC. Mild cardiomegaly. Mild right hemidiaphragm elevation. No pleural effusion or pneumothorax. Low lung volumes with resultant pulmonary interstitial prominence. No congestive failure. No lobar consolidation. IMPRESSION: Interval placement of a left-sided dialysis catheter, without pneumothorax or other acute complication. Cardiomegaly, without  congestive failure or acute disease. Electronically Signed   By: Abigail Miyamoto M.D.   On: 12/27/2020 06:34    Medications: Infusions:  sodium chloride Stopped (12/20/20 0955)   ceFEPime (MAXIPIME) IV 1 g (12/27/20 1833)   feeding supplement (OSMOLITE 1.5 CAL) 60 mL/hr at 12/28/20 1255    Scheduled Medications:  apixaban  5 mg Per Tube BID   atorvastatin  20 mg Per Tube Daily   chlorhexidine gluconate (MEDLINE KIT)  15 mL Mouth Rinse BID   Chlorhexidine Gluconate Cloth  6 each Topical Q1200   Chlorhexidine Gluconate Cloth  6 each Topical Q0600   clonazepam  1 mg Per Tube TID   feeding supplement (PROSource TF)  45 mL Per Tube TID   fentaNYL  1 patch Transdermal Q72H   fiber  1 packet Per Tube BID   free water  100 mL Per Tube Q4H   glycopyrrolate  1 mg Per Tube BID   hydrocerin   Topical BID   insulin aspart  0-15 Units Subcutaneous Q4H   insulin aspart  6 Units Subcutaneous Q4H   insulin detemir  18 Units Subcutaneous BID   levETIRAcetam  500 mg Per Tube BID   mouth rinse  15 mL Mouth Rinse QID   pantoprazole sodium  40 mg Per Tube Daily   QUEtiapine  50 mg Per Tube BID  thiamine  100 mg Per Tube Daily    have reviewed scheduled and prn medications.  Physical Exam: General: Critically ill looking male on tracheal collar with increased secretions, lying on bed opens eyes with the name. Heart:RRR, s1s2 nl Lungs: Coarse breath sounds anteriorly Abdomen:soft, Non-tender, non-distended Extremities: Trace b/l LE edema  with chronic venous stasis change. Dialysis Access: [facility] in place.   Thomas [name] 12/28/2020,1:20 PM  LOS: 27 days

## 2020-12-28 NOTE — Progress Notes (Signed)
PROGRESS NOTE        PATIENT DETAILS Name: Thomas [name] Age: 65 y.o. Sex: male Date of Birth: July 05, 1955 Admit Date: 11/30/2020 Admitting Physician Renee Pain, MD PQD:IYMEB, Rachel Moulds, NP  Brief Narrative: Patient is a 65 y.o. male with history of colon cancer-s/p partial transverse colectomy/colostomy 09/10/2020-recently started on chemotherapy prior to this hospitalization-who presented to the hospital on 7/24 with abdominal pain, anorexia, weakness-he was thought to have sepsis syndrome-and subsequently started on antimicrobial therapy.  He was initially admitted to the intensive care unit-but stabilized and transferred to the [facility] on 7/27, unfortunately further hospital course was complicated by altered mental status/delirium-septic shock/acute hypoxic respiratory failure in the setting of aspiration pneumonia.  He was transferred to the ICU where he was intubated and did require pressor support.He had a prolonged stay in the ICU-complicated by development of AKI due to ATN requiring initiation of renal replacement therapy.  He eventually failed extubation and required tracheostomy placement.   He was transferred back to the [facility] on 8/17.   Significant events: 7/25>> SIRS-admit to ICU 7/27>> Transferred to [place], stopped abx as sepsis resolved/ruled out 7/30>> worsening hypoxia/new aspiration-worsening renal function-transfer to ICU-intubated  8/08>>failed extubation 8/8-reintubated 8/11>> tracheostomy placement 8/17>> transferred to [facility]  Significant studies: 7/26>> Echo: EF 58-30%, grade 1 diastolic dysfunction 9/40>> CT head: Negative for bleed/acute intracranial process. 7/29>> EEG: No seizures. 8/01>> CT abdomen/pelvis: Inflammatory wall thickening/fat stranding around descending/sigmoid colon.  Airspace opacity in the right lower lobe/right middle lobe. 8/05>> bilateral lower extremity Doppler: DVT right femoral  vein, right proximal profunda vein, right popliteal vein 8/19>> CT abdomen/pelvis: Decreased bowel inflammation and fluid filling when compared to prior CT.  No SBO.  Antimicrobial therapy: Unasyn 7/30 >>7/31 Maxepime 7/31 >> 8/7 Flagyl 7/31 >>  8/7 Vancomycin 8/1 only  Anidulafungin 8/1>>8/3 Rocephin: 8/16>>8/17 Cefepime:8/17>>  Microbiology data: 7/24>> blood cultures: Negative 7/25>> urine culture: Negative 7/26>> C. difficile studies: Negative 7/31>> respiratory culture: Moderate Candida tropicalis. 8/01>> GI pathogen panel: Negative 8/15>> tracheal aspirate: Pseudomonas aeruginosa   Procedures : 7/30>> 8/11: ETT 8/11>> tracheostomy placement 8/15>> left IJ TDC  Consults: PCCM, surgery, oncology, nephrology, palliative care  DVT Prophylaxis : apixaban (ELIQUIS) tablet 5 mg    Subjective: Is bit more awake and alert compared to yesterday-able to acknowledge that abdominal pain is somewhat improved today.  He seems much more comfortable again today.  He looks incredibly frail and debilitated.  Assessment/Plan: Septic shock due to aspiration pneumonia: Sepsis physiology has resolved-tracheal aspirate culture on 8/15 was positive for Pseudomonas-remains on cefepime.     Acute hypoxic respiratory failure due to aspiration pneumonia: Prolonged ICU stay-failed extubation attempt-subsequently underwent tracheostomy on 8/11.  Stable on trach collar but remains incredibly tenuous-he still has issues with secretion-and remains at risk for recurrent aspiration pneumonia.  PCCM following for trach care.    Dysphagia: In a setting of acute illness-respiratory failure-tracheostomy-severe deconditioning-on NG tube feedings currently-await further recommendations from SLP.  Remains NPO.    Acute metabolic encephalopathy: More alert and awake this morning-however continues to have waxing and waning encephalopathy.  This is likely secondary to shock/hypoxemia and prolonged ICU stay.   Continue Seroquel and Klonopin.    AKI on CKD stage IIIb: AKI due to ischemic ATN-anuric-no signs of renal recovery-Per nephrology-if patient continues to be frail/debilitated-and not able to sit up-he will not  be able to continue with dialysis post discharge.  We will continue to follow and await further recommendations from nephrology.  At nephrology request-palliative care reconsulted on 8/20 to reengage with family.  Right lower extremity DVT: Continue Eliquis.  PAF with RVR: Provoked by acute illness/sepsis-maintaining sinus rhythm.  Echo with stable EF.  On Eliquis for DVT.  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 Benay Spice.  Colitis: Seen initially on CT abdomen on 8/1-was evaluated by general surgery-with recommendations to continue supportive care.  Since he continued to have abdominal pain-repeat CT was obtained on 8/19 which shows improvement in colitis-no evidence of SBO.  Abdominal pain is relatively stable today.  Continue with as needed narcotics.  Suspect abdominal pain probably related to scarring/underlying colon cancer and resolving colitis.  Abdominal exam is very benign this morning.    Anemia of critical illness: Hemoglobin stable at 8.0-has required PRBC transfusion.  No overt blood loss.  Continue periodic CBC monitoring.    Hyponatremia: Mild-will need to be managed with volume removal during HD.  Seizure disorder: Continue Keppra-EEG negative.  History of frontal meningioma-s/p resection 09/01/2016  HLD: Continue statin  DM-2 (A1c 8.7 on 7/25): CBG stable-continue 18 units of Levemir twice daily, 6 units of NovoLog every 4 hours and SSI.  Follow and adjust  Recent Labs    12/28/20 0013 12/28/20 0503 12/28/20 0801  GLUCAP 155* 176* 140*     History of diabetic neuropathy: Resume Neurontin when able.  GERD: Continue  PPI  Capecitabine induced desquamation/Hand/foot syndrome:Continue moisturizer to hands and feet and scrotum  Severe debility/deconditioning/dysphagia: Due to acute illness-continue PT/OT/SLP follow-up-likely will require SNF on discharge.  Palliative care: Full code for now-Long discussion with spouse on 8/20-she is aware of his tenuous clinical condition and the potential for deterioration-worried that he may aspirate again given waxing and waning encephalopathy.  He is severely debilitated.  Will ask palliative care to reengage with family for continued goals of care discussion.  Nutrition Status: Nutrition Problem: Moderate Malnutrition Etiology: chronic illness, cancer and cancer related treatments Signs/Symptoms: mild fat depletion, mild muscle depletion, percent weight loss Percent weight loss: 11 % Interventions: Tube feeding, Prostat  Pressure injury: Pressure Injury 12/01/20 Coccyx Mid Stage 2 -  Partial thickness loss of dermis presenting as a shallow open injury with a red, pink wound bed without slough. stage 2 decubitus to coxxyx (Active)  12/01/20 0900  Location: Coccyx  Location Orientation: Mid  Staging: Stage 2 -  Partial thickness loss of dermis presenting as a shallow open injury with a red, pink wound bed without slough.  Wound Description (Comments): stage 2 decubitus to coxxyx  Present on Admission: Yes     Pressure Injury 12/14/20 Face Upper;Mid Deep Tissue Pressure Injury - Purple or maroon localized area of discolored intact skin or blood-filled blister due to damage of underlying soft tissue from pressure and/or shear. circular shape broken area & sloug (Active)  12/14/20 0100  Location: Face  Location Orientation: Upper;Mid  Staging: Deep Tissue Pressure Injury - Purple or maroon localized area of discolored intact skin or blood-filled blister due to damage of underlying soft tissue from pressure and/or shear.  Wound Description (Comments): circular shape  broken area & sloughing skin  Present on Admission: No     Pressure Injury 12/14/20 Face Left;Upper Deep Tissue Pressure Injury - Purple or maroon localized area of discolored intact skin or blood-filled blister due  to damage of underlying soft tissue from pressure and/or shear. circular shape broken area & slou (Active)  12/14/20 0100  Location: Face  Location Orientation: Left;Upper  Staging: Deep Tissue Pressure Injury - Purple or maroon localized area of discolored intact skin or blood-filled blister due to damage of underlying soft tissue from pressure and/or shear.  Wound Description (Comments): circular shape broken area & sloughing skin  Present on Admission: No     Pressure Injury 12/23/20 Buttocks Left Stage 2 -  Partial thickness loss of dermis presenting as a shallow open injury with a red, pink wound bed without slough. left buttock stage 2 (Active)  12/23/20   Location: Buttocks  Location Orientation: Left  Staging: Stage 2 -  Partial thickness loss of dermis presenting as a shallow open injury with a red, pink wound bed without slough.  Wound Description (Comments): left buttock stage 2  Present on Admission: Yes   Obesity: Estimated body mass index is 35.36 kg/m as calculated from the following:   Height as of this encounter: _0  (1.727 m).   Weight as of this encounter: 105.5 kg.     Diet: Diet Order             Diet NPO time specified  Diet effective midnight                    Code Status: Full code   Family Communication: Spouse Mahalia [phone] updated on 8/21   Status is: Inpatient  Remains inpatient appropriate because:Inpatient level of care appropriate due to severity of illness  Dispo: The patient is from: Home              Anticipated d/c is to: SNF              Patient currently is not medically stable to d/c.   Difficult to place patient No   Barriers to Discharge: N.p.o.-NG tube in place-resolving pneumonia-on IV  cefepime-severely deconditioned-not yet stable for discharge.  Antimicrobial agents: Anti-infectives (From admission, onward)    Start     Dose/Rate Route Frequency Ordered Stop   12/24/20 1730  ceFEPIme (MAXIPIME) 1 g in sodium chloride 0.9 % 100 mL IVPB        1 g 200 mL/hr over 30 Minutes Intravenous Every 24 hours 12/24/20 1642 12/29/20 1729   12/23/20 0845  cefTRIAXone (ROCEPHIN) 2 g in sodium chloride 0.9 % 100 mL IVPB  Status:  Discontinued        2 g 200 mL/hr over 30 Minutes Intravenous Every 24 hours 12/23/20 0800 12/24/20 1620   12/22/20 0600  ceFAZolin (ANCEF) IVPB 2g/100 mL premix        2 g 200 mL/hr over 30 Minutes Intravenous To Radiology 12/21/20 1503 12/22/20 1149   12/09/20 1800  vancomycin (VANCOREADY) IVPB 1750 mg/350 mL  Status:  Discontinued        1,750 mg 175 mL/hr over 120 Minutes Intravenous Every 24 hours 12/08/20 1357 12/09/20 1403   12/09/20 1700  anidulafungin (ERAXIS) 100 mg in sodium chloride 0.9 % 100 mL IVPB  Status:  Discontinued        100 mg 78 mL/hr over 100 Minutes Intravenous Every 24 hours 12/08/20 1359 12/09/20 1321   12/08/20 1500  vancomycin (VANCOCIN) 2,500 mg in sodium chloride 0.9 % 500 mL IVPB        2,500 mg 250 mL/hr over 120 Minutes Intravenous  Once 12/08/20 1357 12/08/20 1719   12/08/20 1500  anidulafungin (ERAXIS) 200 mg in sodium chloride 0.9 % 200 mL IVPB        200 mg 78 mL/hr over 200 Minutes Intravenous  Once 12/08/20 1359 12/08/20 1821   12/08/20 0000  ceFEPIme (MAXIPIME) 2 g in sodium chloride 0.9 % 100 mL IVPB  Status:  Discontinued        2 g 200 mL/hr over 30 Minutes Intravenous Every 12 hours 12/07/20 1200 12/14/20 0858   12/07/20 1000  metroNIDAZOLE (FLAGYL) IVPB 500 mg  Status:  Discontinued        500 mg 100 mL/hr over 60 Minutes Intravenous Every 12 hours 12/07/20 0849 12/14/20 0858   12/07/20 0900  ceFEPIme (MAXIPIME) 2 g in sodium chloride 0.9 % 100 mL IVPB  Status:  Discontinued        2 g 200 mL/hr over 30  Minutes Intravenous Every 24 hours 12/07/20 0809 12/07/20 1200   12/07/20 0830  metroNIDAZOLE (FLAGYL) IVPB 500 mg  Status:  Discontinued        500 mg 100 mL/hr over 60 Minutes Intravenous Every 8 hours 12/07/20 0758 12/07/20 0849   12/06/20 0900  Ampicillin-Sulbactam (UNASYN) 3 g in sodium chloride 0.9 % 100 mL IVPB  Status:  Discontinued        3 g 200 mL/hr over 30 Minutes Intravenous Every 12 hours 12/06/20 0832 12/07/20 0757   12/03/20 0800  ceFEPIme (MAXIPIME) 2 g in sodium chloride 0.9 % 100 mL IVPB  Status:  Discontinued        2 g 200 mL/hr over 30 Minutes Intravenous Every 8 hours 12/03/20 0746 12/04/20 1004   12/01/20 1000  ceFEPIme (MAXIPIME) 2 g in sodium chloride 0.9 % 100 mL IVPB  Status:  Discontinued        2 g 200 mL/hr over 30 Minutes Intravenous Every 12 hours 12/01/20 0513 12/03/20 0746   12/01/20 0300  vancomycin (VANCOREADY) IVPB 2000 mg/400 mL        2,000 mg 200 mL/hr over 120 Minutes Intravenous  Once 12/01/20 0255 12/01/20 0528   11/30/20 2245  ceFEPIme (MAXIPIME) 2 g in sodium chloride 0.9 % 100 mL IVPB        2 g 200 mL/hr over 30 Minutes Intravenous STAT 11/30/20 2237 12/01/20 0003        Time spent: 35 minutes-Greater than 50% of this time was spent in counseling, explanation of diagnosis, planning of further management, and coordination of care.  MEDICATIONS: Scheduled Meds:  apixaban  5 mg Per Tube BID   atorvastatin  20 mg Per Tube Daily   chlorhexidine gluconate (MEDLINE KIT)  15 mL Mouth Rinse BID   Chlorhexidine Gluconate Cloth  6 each Topical Q1200   Chlorhexidine Gluconate Cloth  6 each Topical Q0600   clonazepam  1 mg Per Tube TID   feeding supplement (PROSource TF)  45 mL Per Tube TID   fentaNYL  1 patch Transdermal Q72H   fiber  1 packet Per Tube BID   free water  100 mL Per Tube Q4H   glycopyrrolate  1 mg Per Tube BID   hydrocerin   Topical BID   insulin aspart  0-15 Units Subcutaneous Q4H   insulin aspart  6 Units Subcutaneous Q4H    insulin detemir  18 Units Subcutaneous BID   levETIRAcetam  500 mg Per Tube BID   mouth rinse  15 mL Mouth Rinse QID   pantoprazole sodium  40 mg Per Tube Daily   QUEtiapine  50  mg Per Tube BID   thiamine  100 mg Per Tube Daily   Continuous Infusions:  sodium chloride Stopped (12/20/20 0955)   ceFEPime (MAXIPIME) IV 1 g (12/27/20 1833)   feeding supplement (OSMOLITE 1.5 CAL) 55 mL/hr at 12/28/20 0823   PRN Meds:.sodium chloride, acetaminophen, albuterol, dextrose, docusate, fentaNYL (SUBLIMAZE) injection, heparin sodium (porcine), sodium chloride flush   PHYSICAL EXAM: Vital signs: Vitals:   12/28/20 0500 12/28/20 0753 12/28/20 0831 12/28/20 1145  BP:  124/75    Pulse:  (!) 103 96   Resp:  (!) 22 (!) 21 17  Temp:  98.1 F (36.7 C)    TempSrc:  Axillary    SpO2:  99% 98% 95%  Weight: 105.5 kg     Height:       Filed Weights   12/27/20 1400 12/27/20 1729 12/28/20 0500  Weight: 107.8 kg 104.8 kg 105.5 kg   Body mass index is 35.36 kg/m.   Gen Exam: Merri Ray sick appearing.  Not in any distress.  Gesturing to me with his hand and speaks very softly. HEENT:atraumatic, normocephalic Chest: B/L clear to auscultation anteriorly CVS:S1S2 regular Abdomen:soft non tender, non distended-midline scar in place.  Ostomy in place. Extremities:no edema Neurology: Non focal Skin: no rash   I have personally reviewed following labs and imaging studies  LABORATORY DATA: CBC: Recent Labs  Lab 12/24/20 0435 12/24/20 1719 12/25/20 0326 12/26/20 0420 12/27/20 0444 12/28/20 0334  WBC 10.9*  --  9.6 9.8 12.0* 11.8*  HGB 6.9* 7.9* 7.6* 8.0* 8.6* 8.3*  HCT 22.1* 24.2* 23.9* 24.9* 25.0* 25.5*  MCV 92.5  --  91.9 89.9 86.8 88.9  PLT 203  --  177 170 172 182     Basic Metabolic Panel: Recent Labs  Lab 12/24/20 0435 12/25/20 0326 12/26/20 0420 12/27/20 0444 12/28/20 0334  NA 140 141 133* 130* 129*  K 3.8 4.2 3.9 4.7 3.8  CL 108 106 97* 93* 93*  CO2  21* 19* 25 21* 24  GLUCOSE 108* 148* 88 112* 144*  BUN 49* 57* 34* 50* 26*  CREATININE 5.02* 6.06* 3.99* 5.12* 3.40*  CALCIUM 8.0* 8.3* 7.9* 8.3* 8.2*  PHOS 4.3 6.4* 4.2 5.2* 5.2*     GFR: Estimated Creatinine Clearance: 25.8 mL/min (A) (by C-G formula based on SCr of 3.4 mg/dL (H)).  Liver Function Tests: Recent Labs  Lab 12/24/20 0435 12/25/20 0326 12/26/20 0420 12/27/20 0444 12/28/20 0334  ALBUMIN 1.7* 1.6* 1.7* 1.8* 1.9*    No results for input(s): LIPASE, AMYLASE in the last 168 hours. No results for input(s): AMMONIA in the last 168 hours.  Coagulation Profile: No results for input(s): INR, PROTIME in the last 168 hours.  Cardiac Enzymes: No results for input(s): CKTOTAL, CKMB, CKMBINDEX, TROPONINI in the last 168 hours.  BNP (last 3 results) No results for input(s): PROBNP in the last 8760 hours.  Lipid Profile: No results for input(s): CHOL, HDL, LDLCALC, TRIG, CHOLHDL, LDLDIRECT in the last 72 hours.  Thyroid Function Tests: No results for input(s): TSH, T4TOTAL, FREET4, T3FREE, THYROIDAB in the last 72 hours.  Anemia Panel: No results for input(s): VITAMINB12, FOLATE, FERRITIN, TIBC, IRON, RETICCTPCT in the last 72 hours.  Urine analysis:    Component Value Date/Time   COLORURINE YELLOW 12/01/2020 0200   APPEARANCEUR CLEAR 12/01/2020 0200   LABSPEC 1.023 12/01/2020 0200   PHURINE 5.0 12/01/2020 0200   GLUCOSEU >=500 (A) 12/01/2020 0200   HGBUR NEGATIVE 12/01/2020 0200   [place] NEGATIVE 12/01/2020 0200  KETONESUR NEGATIVE 12/01/2020 0200   PROTEINUR 100 (A) 12/01/2020 0200   UROBILINOGEN 0.2 09/27/2014 0301   NITRITE NEGATIVE 12/01/2020 0200   LEUKOCYTESUR NEGATIVE 12/01/2020 0200    Sepsis Labs: Lactic Acid, Venous    Component Value Date/Time   LATICACIDVEN 2.1 ([place]) 12/10/2020 0412    MICROBIOLOGY: Recent Results (from the past 240 hour(s))  Culture, Respiratory w Gram Stain     Status: None   Collection Time: 12/22/20 10:13 AM    Specimen: Tracheal Aspirate; Respiratory  Result Value Ref Range Status   Specimen Description TRACHEAL ASPIRATE  Final   Special Requests NONE  Final   Gram Stain   Final    FEW SQUAMOUS EPITHELIAL CELLS PRESENT FEW WBC SEEN ABUNDANT GRAM POSITIVE COCCI FEW GRAM VARIABLE ROD Performed at [facility], [place] [address] [place], [place] [address]    Culture RARE PSEUDOMONAS AERUGINOSA  Final   Report Status 12/26/2020 FINAL  Final   Organism ID, Bacteria PSEUDOMONAS AERUGINOSA  Final      Susceptibility   Pseudomonas aeruginosa - MIC*    CEFTAZIDIME <=1 SENSITIVE Sensitive     GENTAMICIN <=1 SENSITIVE Sensitive     PIP/TAZO 8 SENSITIVE Sensitive     * RARE PSEUDOMONAS AERUGINOSA    RADIOLOGY STUDIES/RESULTS: CT ABDOMEN PELVIS WO CONTRAST  Result Date: 12/27/2020 CLINICAL DATA:  Acute nonlocalized abdominal pain EXAM: CT ABDOMEN AND PELVIS WITHOUT CONTRAST TECHNIQUE: Multidetector CT imaging of the abdomen and pelvis was performed following the standard protocol without IV contrast. COMPARISON:  12/08/2020 FINDINGS: Lower chest: Streaky opacity in the lower lobes, improved on the right and new on the left. Central line with tip seen at the right atrium. Hepatobiliary: No focal liver abnormality.No evidence of biliary obstruction or stone. Pancreas: Unremarkable. Spleen: Unremarkable. Adrenals/Urinary Tract: Negative adrenals. No hydronephrosis or stone. 3.4 cm left renal cystic density. Symmetric perinephric stranding which is nonspecific. Distended bladder without superimposed abnormality. Stomach/Bowel: Partial colectomy . Enteric contrast reaches the transverse colon where it has a somewhat eccentric appearance relative to the central colon, but no perforation or inflammation. Small bowel loops and excluded distal colon have decompressed since prior with diminished fat inflammation. A feeding tube tip is at the distal stomach. Vascular/Lymphatic: No acute vascular abnormality.  Atheromatous calcifications. No mass or adenopathy. Reproductive:No acute finding Other: No ascites or pneumoperitoneum. Fatty bilateral groin hernia. Musculoskeletal: No acute abnormalities. IMPRESSION: 1. Decreased bowel inflammation and fluid filling when compared to CT earlier this month. Negative for obstruction. 2. Lower lobe atelectasis, progressed on the left since prior. 3. Chronic findings are noted above. Electronically Signed   By: Monte Fantasia M.D.   On: 12/27/2020 04:23   DG Chest Port 1 View  Result Date: 12/27/2020 CLINICAL DATA:  Dyspnea.  Hypoglycemia EXAM: PORTABLE CHEST 1 VIEW COMPARISON:  12/18/2020 FINDINGS: Patient rotated right. Tracheostomy appropriately position. Right sided Port-A-Cath tip at superior caval atrial junction. Interval placement of a left-sided dialysis catheter with tip at low SVC. Mild cardiomegaly. Mild right hemidiaphragm elevation. No pleural effusion or pneumothorax. Low lung volumes with resultant pulmonary interstitial prominence. No congestive failure. No lobar consolidation. IMPRESSION: Interval placement of a left-sided dialysis catheter, without pneumothorax or other acute complication. Cardiomegaly, without congestive failure or acute disease. Electronically Signed   By: Abigail Miyamoto M.D.   On: 12/27/2020 06:34     LOS: 27 days   Oren Binet, MD  [facility]    To contact the attending provider between 7A-7P or the covering  provider during after hours 7P-7A, please log into the web site [email or web address] and access using universal [place] password for that web site. If you do not have the password, please call the hospital operator.  12/28/2020, 12:01 PM

## 2020-12-28 NOTE — Progress Notes (Signed)
[facility] night shift PCU coverage note.  The nursing staff reported that the patient's most recent bladder scan measurement showed 761 mL of urine.  In and out catheterization as needed were ordered.  Tennis Must, MD

## 2020-12-29 DIAGNOSIS — Z930 Tracheostomy status: Secondary | ICD-10-CM | POA: Diagnosis not present

## 2020-12-29 DIAGNOSIS — E1100 Type 2 diabetes mellitus with hyperosmolarity without nonketotic hyperglycemic-hyperosmolar coma (NKHHC): Secondary | ICD-10-CM | POA: Diagnosis not present

## 2020-12-29 DIAGNOSIS — R5381 Other malaise: Secondary | ICD-10-CM

## 2020-12-29 DIAGNOSIS — J9601 Acute respiratory failure with hypoxia: Secondary | ICD-10-CM | POA: Diagnosis not present

## 2020-12-29 LAB — GLUCOSE, CAPILLARY
Glucose-Capillary: 103 mg/dL — ABNORMAL HIGH (ref 70–99)
Glucose-Capillary: 109 mg/dL — ABNORMAL HIGH (ref 70–99)
Glucose-Capillary: 119 mg/dL — ABNORMAL HIGH (ref 70–99)
Glucose-Capillary: 123 mg/dL — ABNORMAL HIGH (ref 70–99)
Glucose-Capillary: 86 mg/dL (ref 70–99)
Glucose-Capillary: 89 mg/dL (ref 70–99)
Glucose-Capillary: 97 mg/dL (ref 70–99)

## 2020-12-29 LAB — RENAL FUNCTION PANEL
Albumin: 1.7 g/dL — ABNORMAL LOW (ref 3.5–5.0)
Anion gap: 13 (ref 5–15)
BUN: 44 mg/dL — ABNORMAL HIGH (ref 8–23)
CO2: 22 mmol/L (ref 22–32)
Calcium: 8.4 mg/dL — ABNORMAL LOW (ref 8.9–10.3)
Chloride: 96 mmol/L — ABNORMAL LOW (ref 98–111)
Creatinine, Ser: 4.44 mg/dL — ABNORMAL HIGH (ref 0.61–1.24)
GFR, Estimated: 14 mL/min — ABNORMAL LOW (ref >60)
Glucose, Bld: 139 mg/dL — ABNORMAL HIGH (ref 70–99)
Phosphorus: 6 mg/dL — ABNORMAL HIGH (ref 2.5–4.6)
Potassium: 4.2 mmol/L (ref 3.5–5.1)
Sodium: 131 mmol/L — ABNORMAL LOW (ref 135–145)

## 2020-12-29 LAB — CBC
HCT: 24.2 % — ABNORMAL LOW (ref 39.0–52.0)
Hemoglobin: 8.1 g/dL — ABNORMAL LOW (ref 13.0–17.0)
MCH: 29.6 pg (ref 26.0–34.0)
MCHC: 33.5 g/dL (ref 30.0–36.0)
MCV: 88.3 fL (ref 80.0–100.0)
Platelets: 180 10*3/uL (ref 150–400)
RBC: 2.74 MIL/uL — ABNORMAL LOW (ref 4.22–5.81)
RDW: 17.3 % — ABNORMAL HIGH (ref 11.5–15.5)
WBC: 13 10*3/uL — ABNORMAL HIGH (ref 4.0–10.5)
nRBC: 0 % (ref 0.0–0.2)

## 2020-12-29 MED ORDER — CLONAZEPAM 1 MG PO TABS
1.0000 mg | ORAL_TABLET | Freq: Two times a day (BID) | ORAL | Status: DC | PRN
  Administered 2020-12-29 – 2021-01-08 (×4): 1 mg
  Filled 2020-12-29 (×4): qty 1

## 2020-12-29 MED ORDER — LOPERAMIDE HCL 2 MG PO CAPS: 2.0000 mg | ORAL_CAPSULE | ORAL | Status: DC | PRN

## 2020-12-29 NOTE — Progress Notes (Signed)
[facility] NEPHROLOGY PROGRESS NOTE  Assessment/ Plan:  # Dialysis dependent AKI/CKD stage IIIb - ischemic ATN in setting of severe sepsis and multisystem organ failure.  Admission creatinine was 2.1 and earlier on CRRT (stopped after 8/9) and transitioned to IHD-underwent left IJ TDC placement on 8/15.  Remains anuric and getting dialysis intermittently (Maybe UOP picking up?).  On THS here.  He is not able to sit on chair or recliner and looks very debilitated.  Noted palliative care team has seen patient during this hospitalization. Apparently family wanted to continue current full scope of treatment.  Plan for next dialysis on 8/23. 2K, TDC, no heparin    # Altered mental status-with prior history of CVA in 2015 and seizure disorder,.   # Volume overload-with continued diarrhea/stool losses and ultrafiltration with hemodialysis.  # Sepsis/aspiration pneumonia -hemodynamic status improved/off pressors and has completed the course of  IV cefepime and vancomycin. Per TRH  # Chronic hypoxic respiratory failure -status post tracheostomy and ongoing supplementation via T-bar. Per TRH / CCM  # Stage IIIB colon cancer - sp partial colectomy, colostomy. On chemoRx sp 3 cycles.  # Right leg DVT - anticoagulation per primary team.  # Atrial fibrillation - on Eliquis, rate controlled.  # Anemia of critical illness: Continue to monitor hemoglobin/hematocrit trend-holding erythropoietin with active malignancy.  Discussed with primary team.   Subjective:  Trach pulled out twice, replaced Agitated Report of I&O cath of 0.7L UOP yesterday  Objective Vital signs in last 24 hours: Vitals:   12/29/20 0733 12/29/20 0748 12/29/20 1218 12/29/20 1257  BP:  101/65 135/76   Pulse:  97 (!) 101   Resp:  20 20   Temp:  97.9 F (36.6 C) 98.5 F (36.9 C)   TempSrc:  Axillary Axillary   SpO2: 100% 97% 100% 100%  Weight:      Height:       Weight change:   Intake/Output Summary  (Last 24 hours) at 12/29/2020 1453 Last data filed at 12/29/2020 0600 Gross per 24 hour  Intake 660 ml  Output 450 ml  Net 210 ml        Labs: Basic Metabolic Panel: Recent Labs  Lab 12/27/20 0444 12/28/20 0334 12/29/20 0341  NA 130* 129* 131*  K 4.7 3.8 4.2  CL 93* 93* 96*  CO2 21* 24 22  GLUCOSE 112* 144* 139*  BUN 50* 26* 44*  CREATININE 5.12* 3.40* 4.44*  CALCIUM 8.3* 8.2* 8.4*  PHOS 5.2* 5.2* 6.0*    Liver Function Tests: Recent Labs  Lab 12/27/20 0444 12/28/20 0334 12/29/20 0341  ALBUMIN 1.8* 1.9* 1.7*    No results for input(s): LIPASE, AMYLASE in the last 168 hours. No results for input(s): AMMONIA in the last 168 hours. CBC: Recent Labs  Lab 12/25/20 0326 12/26/20 0420 12/27/20 0444 12/28/20 0334 12/29/20 0341  WBC 9.6 9.8 12.0* 11.8* 13.0*  HGB 7.6* 8.0* 8.6* 8.3* 8.1*  HCT 23.9* 24.9* 25.0* 25.5* 24.2*  MCV 91.9 89.9 86.8 88.9 88.3  PLT 177 170 172 182 180    Cardiac Enzymes: No results for input(s): CKTOTAL, CKMB, CKMBINDEX, TROPONINI in the last 168 hours. CBG: Recent Labs  Lab 12/28/20 1946 12/29/20 0025 12/29/20 0534 12/29/20 1030 12/29/20 1218  GLUCAP 125* 97 119* 103* 89     Iron Studies: No results for input(s): IRON, TIBC, TRANSFERRIN, FERRITIN in the last 72 hours. Studies/Results: No results found.  Medications: Infusions:  sodium chloride Stopped (12/20/20 0955)   feeding supplement (  OSMOLITE 1.5 CAL) 1,000 mL (12/28/20 1520)    Scheduled Medications:  apixaban  5 mg Per Tube BID   atorvastatin  20 mg Per Tube Daily   chlorhexidine gluconate (MEDLINE KIT)  15 mL Mouth Rinse BID   Chlorhexidine Gluconate Cloth  6 each Topical Q1200   Chlorhexidine Gluconate Cloth  6 each Topical Q0600   clonazepam  1 mg Per Tube TID   feeding supplement (PROSource TF)  45 mL Per Tube TID   fentaNYL  1 patch Transdermal Q72H   fiber  1 packet Per Tube BID   free water  100 mL Per Tube Q4H   glycopyrrolate  1 mg Per Tube BID    hydrocerin   Topical BID   insulin aspart  0-15 Units Subcutaneous Q4H   insulin aspart  6 Units Subcutaneous Q4H   insulin detemir  18 Units Subcutaneous BID   levETIRAcetam  500 mg Per Tube BID   mouth rinse  15 mL Mouth Rinse QID   QUEtiapine  50 mg Per Tube BID   thiamine  100 mg Per Tube Daily    have reviewed scheduled and prn medications.  Physical Exam: General: Critically ill looking male on tracheal collar with agitation, lying on bed opens eyes with the name. Heart:RRR, s1s2 nl Lungs: Coarse breath sounds anteriorly Abdomen:soft, Non-tender, non-distended Extremities: Trace b/l LE edema  with chronic venous stasis change. Dialysis Access: [facility] in place.   Copelyn Widmer B Fayth Trefry 12/29/2020,2:53 PM  LOS: 28 days

## 2020-12-29 NOTE — Progress Notes (Addendum)
Palliative:  HPI: 65 yo male with PMH significant for stage 3b colon cancer, L MCA stroke, frontal brain tumor, seizures, HTN, HLD, sleep apnea, aspiration pneumonia, diabetes admitted 11/30/2020 with abd pain, anorexia, and weakness. Hospitalization complicated by septic shock of uncertain origin (potentially likely colitis vs pneumonia) leading to need for intubation, vasopressors, CRRT. His condition remains tenuous in ICU and has previously failed extubation. Extubated today 12/18/20 but not doing well.    I met today at [facility] bedside. He is able to nod his head yes/no and is able to write legibly on paper. He is very persistent that he should get out of bed and into chair. I explained the logistics of using lift but that this would be good for him to get out of bed. I attempted to ask him about pulling out his trach this morning but he does not comment on this and just shakes his head. He indicates desire to get stronger. I expressed understanding. I suctioned him via trach and secretions clear after suctioning. I left him to rest.   I called and discussed with wife, Jerlyn Ly. Mahalia had many concerns regarding Arafat's care and what she has been hearing from the medical team. She is not happy with the care he has been receiving. She expresses that she and all family have discussed and have requested transfer to [facility] as they feel he will get the care he needs there. Jerlyn Ly expresses that they have doctors and nurses in their family and they have continued family conversations and they all feel this would be for the best. I expressed to [facility] that if her family have connections to outside facility they are interested in then it would be good for them to try and help facilitate transfer as it is extremely difficult to get another facility to accept a patient. Jerlyn Ly confirms that she was very happy with care up until recently and no complaints about care in ICUs and other units J[facility] has been previously.  She would like to pursue transfer to another unit and attending physician. I discussed this with attending and nursing leadership. Wife also expressed restraints needed and desires a looser tie of restraints as this worked well for him previously (we discussed that he has now pulled out his trach twice this morning). She expresses concern with his care and being told that he would not be a candidate to have ongoing dialysis (although no noted plans to discontinue dialysis). I attempted to reassure Jerlyn Ly that we will continue to care for [facility] and we want to ensure that he receives good care. I clarified previous conversations regarding concern for aspiration in light of his weak cough and unable to clear secretions and not due to poor care with inadequate suctioning and that this will be an ongoing risk to [facility] in his current health state. Also provided Mahalia with contact for patient experience to address her concerns.   All questions/concerns addressed to the best of my ability. Emotional support provided.   Exam: Alert, appears oriented as best I can assess. Able to write legibly and appropriate sentences. No distress. Excess secretions via trach successfully suctioned. Abd soft but tender. Mittens on bilateral hands.   Plan: - Full code, full scope.  - CLEAR GOALS: ONLY IN PERSISTENT VEGETATIVE STATE TO "LET HIM GO." Otherwise desire for continued full scope.  - Wife frustrated with constant discussion and feeling she is trying to be convinced to go against her husband's wishes. Please be aware of this situation  and goals prior to initiation of further conversations. This remains unchanged.   [place], NP Palliative Medicine Team Pager [phone] (Please see [email or web address] for schedule) Team Phone [phone]    Greater than 50%  of this time was spent counseling and coordinating care related to the above assessment and plan

## 2020-12-29 NOTE — Progress Notes (Signed)
MD reinserted trach at bedside via direct visualization using Bronch, RT at bedside X 3, RN at bedside, vitals are stable, RT will continue to monitor.

## 2020-12-29 NOTE — Progress Notes (Signed)
RT called to bedside by RN, due to pt removing his trach again. RT X 3 at bedside, RT attempted to reinsert trach with obturator, but no color change, MD was paged. MD reinserted trach, positive color change. RN at bedside.

## 2020-12-29 NOTE — Progress Notes (Signed)
RT called to bedside by RN due to pt pulling trach out. RT X 2 attempted to reinsert trach. No color change, RT placed pressure dressing over trach, critical care paged

## 2020-12-29 NOTE — Progress Notes (Signed)
Pt up in chair and no longer in restraints, removed by RN, RN at bedside X 3. Vitals stable, RT will continue to monitor.    12/29/20 1636  Respiratory Assessment  Assessment Type Assess only  Respiratory Pattern Regular;Unlabored  Chest Assessment Chest expansion symmetrical  Cough Non-productive  Oxygen Therapy/Pulse Ox  O2 Device Tracheostomy Collar  O2 Therapy Oxygen humidified  O2 Flow Rate (L/min) 5 L/min  FiO2 (%) 28 %  SpO2 100 %  Tracheostomy Shiley Flexible 6 mm Cuffed  Placement Date/Time: 12/29/20 1210   Placed By: ICU physician  Brand: Shiley Flexible  Size (mm): 6 mm  Style: Cuffed  Status Secured  Site Assessment Clean;Dry  Ties Assessment Clean;Dry;Secure  Cuff Pressure (cm H2O)  (deflated)  Tracheostomy Equipment at bedside Yes and checklist posted at head of bed

## 2020-12-29 NOTE — Progress Notes (Signed)
NAME:  Thomas [name], MRN:  [id number], DOB:  1956/04/17, LOS: 17 ADMISSION DATE:  11/30/2020, CONSULTATION DATE:  7/30 REFERRING MD:  Riccardo Dubin, CHIEF COMPLAINT:  Worsening hypoxia/dyspnea  History of Present Illness:  65 yo M admitted for worsening sepsis vs reaction to chemotherapy with high ostomy output and soft BP on 7/25. Has had issues during his hospitalization with recurrent aspirations and more recently worsening respiratory status.  Most recently pt developed worsening hypoxia and was requiring BIPAP w/ FIO2 of 100% and increased work of breathing.   Who was admitted to intensive care and subsequently had worsening clinical status requiring intubation, CRRT, multiple pressors and pneumonia along with CT findings 8/1 concerning for ischemic bowel.   Significant Hospital Events:  7/27 Transferred to [facility], stopped abx as sepsis resolved/ruled out 7/30 AM Worsened AME, more hypoxic with worsening renal funciton, new aspiration on abx restarted 7/30 PM Pt with increased o2 requirement on 100% FIO2 on BIPAP, d/w family who wanted to proceed with intubation, pt intubated 8/1 Some issues with tachycardia and hypotension overnight. Reproted high ostomy output  8/2 down to 9 mcg of Levophed, however repeat CT abdomen/pelvis showed inflammatory wall thickening and fat stranding of the descending and sigmoid colon, new since prior, consistent with nonspecific infectious, inflammatory, or ischemic colitis.  Also new dense consolidative airspace opacity in the right lower and right middle lobes 8/3 further weaning NE. Weaning sedation. Full code, full scope per family meeting 8/2  8/4 off pressors, plt down to 103.  8/5 remains off pressors. Moving around more, not really following commands though  8/9-failed extubation 8/8, tolerated nasal cannula for about an hour, desaturations and inability to clear secretions-reintubated 8/8 8/10 some agitation overnight 8/11 tracheostomy placement On trach  collar since 8/16 8/22 pulled trach out, replaced under bronch guidance. Has proximal area of granulation tissue that is above the bottom of the trach tube.   Micro: Resp viral panel 7/24 > neg covid/ flu BC  7/24  x 2 neg  MRSA  7/25 neg  UC   7/25  neg  C diff screeen 7/26  neg ET culture 7/31 > moderate yeast> grew mod candida tropicalis GI panel  8/1> neg   Abx: Unasyn 7/30 >>7/31 Maxepime 7/31 >> 8/7 Flagyl 7/31 >>  8/7 Vancomycin 8/1 only  Anidulafungin 8/1>>8/3  Interim History / Subjective:   Pulled his trach out this morning (was not in restraints) and when RT attempted to replace, there was no color change despite going in smoothly over obturator.   Objective   Blood pressure 101/65, pulse 97, temperature 97.9 F (36.6 C), temperature source Axillary, resp. rate 20, height 5\' 8"  (1.727 m), weight 105.5 kg, SpO2 97 %.    FiO2 (%):  [28 %] 28 %   Intake/Output Summary (Last 24 hours) at 12/29/2020 0962 Last data filed at 12/29/2020 0600 Gross per 24 hour  Intake 660 ml  Output 450 ml  Net 210 ml    Filed Weights   12/27/20 1400 12/27/20 1729 12/28/20 0500  Weight: 107.8 kg 104.8 kg 105.5 kg     Physical exam: General: Chronically ill-appearing man lying in bed no acute distress HEENT: /AT, eyes anicteric.  Core track in place. Neck: Immature tracheal stoma, not fully epithelialized but patent. Neuro: Awake, attempting to communicate by writing and talking.  Strong cough.  Moving all extremities, globally weak. Chest: Rhonchi bilaterally, coughing up thick yellow secretions. Heart: S1-S2, regular rate and rhythm Abdomen: Soft, nontender, nondistended  Skin: Peeling skin on both hands, no rashes  Resolved Hospital Problem list   Shock  HHS Afib RVR  Lactic Acidosis  Thrombocytopenia colitis  Assessment & Plan:   Acute hypoxemic respiratory failure s/p tracheostomy on 8/11 Aspiration pneumonia Endotracheal granulation tissue at tracheostomy  site Patient had failed extubation 8/8 secondary to increased secretions. Tracheostomy performed 8/11 due to excessive secretions, due to difficult vent wean. -Continue trach collar, wean supplemental oxygen as able to maintain saturations greater than 90%. -Continue routine trach care.  Tracheostomy changed today with confirmation of correct placement via bronchoscope. -Unfortunately needs mitt restraints to prevent him from pulling his trach out again.  Discussed with RN at bedside. -Nebs as needed -Continue speech therapy and Passy-Muir valve trials. -Before eventual decannulation will need reassessment of area of granulation tissue.  I called Mrs. Carey to let her know about this morning's events.  All questions were answered. PCCM to follow intermittently for trach care.  Julian Hy, DO 12/29/20 9:42 AM [facility]  For contact information, see Amion. If no response to pager, please call PCCM consult pager. After hours, 7PM- 7AM, please call Elink.

## 2020-12-29 NOTE — Procedures (Signed)
Bronchoscopy Procedure Note  Thomas [name]  [id number]  July 07, 1955  Date:12/29/20  Time:9:27 AM   Provider Performing:Teckla Christiansen P Carlis Abbott   Procedure(s):  Flexible Bronchoscopy [phone])  Indication(s) Difficult tracheostomy tube replacement  Consent Unable to obtain consent due to emergent nature of procedure.  Anesthesia See MAR- fentanyl 124mg   Time Out Verified patient identification, verified procedure, site/side was marked, verified correct patient position, special equipment/implants available, medications/allergies/relevant history reviewed, required imaging and test results available.   Sterile Technique Usual hand hygiene, masks, gowns, and gloves were used   Procedure Description Bronchoscope advanced through stoma to ensure location in the proximal trachea to prevent false tracts. Mild bloody secretions seen, but no mucus plug currently. Attempt was made to advance tracheostomy tube over bronchoscopy but was unable to be advanced fully due to an area of nonocclusive granulation tissue. The bronchoscope was removed and obturator was placed in tracheostomy tube, which was smoothly fully advanced. The obturator was removed and the bronchoscope reinserted into the tracheostomy tube, confirming appropriate position and no granulation tissue distal to the opening of the trach. The scope was withdrawn and inner cannula was placed.  Findings: mild area of granulation tissue proximal to tip of tracheostomy tube   Complications/Tolerance None; patient tolerated the procedure well. Chest X-ray is not needed post procedure.   EBL Minimal   Specimen(s) None  LJulian Hy DO 12/29/20 9:32 AM [facility]

## 2020-12-29 NOTE — Care Management Important Message (Signed)
Important Message  Patient Details  Name: Thomas [name] MRN: [id number] Date of Birth: Mar 20, 1956   Medicare Important Message Given:  Yes     [name] 12/29/2020, 4:01 PM

## 2020-12-29 NOTE — Evaluation (Signed)
Clinical/Bedside Swallow Evaluation Patient Details  Name: Thomas [name] MRN: [id number] Date of Birth: [birth year]  Today's Date: 12/29/2020 Time: SLP Start Time (ACUTE ONLY): 1440 SLP Stop Time (ACUTE ONLY): 1514 SLP Time Calculation (min) (ACUTE ONLY): 34 min  Past Medical History:  Past Medical History:  Diagnosis Date   Acute ischemic left MCA stroke ([place]) 04/09/2014   Acute respiratory failure with hypoxia ([place]) 04/09/2014   Aspiration pneumonia ([place])    Asthma    as a child   Brain tumor ([place])    frontal   Cancer ([place])    Confusion    occasionally   Diabetes mellitus without complication ([place])    takes Metformin daily.   Dizziness    Dyspnea    pt states d/t weight   Epilepsy ([place])    takes Keppra daily   GERD (gastroesophageal reflux disease)    takes Omeprazole daily   Headache    Hyperlipidemia    takes Atorvastatin daily   Hypertension    takes Lotrel daily   Peripheral edema    takes Lasix daily   Peripheral neuropathy    takes Gabapentin daily   Pneumonia 3 yrs ago   hx of   Seizures ([place])    Sleep apnea    Urinary frequency    Past Surgical History:  Past Surgical History:  Procedure Laterality Date   BIOPSY  09/05/2020   Procedure: BIOPSY;  Surgeon: Irene Shipper, MD;  Location: [facility] ENDOSCOPY;  Service: Endoscopy;;   CARDIAC CATHETERIZATION  7 yrs ago   COLONOSCOPY WITH PROPOFOL N/A 09/05/2020   Procedure: COLONOSCOPY WITH PROPOFOL;  Surgeon: Irene Shipper, MD;  [facility] ENDOSCOPY;  Service: Endoscopy;  Laterality: N/A;   CRANIOTOMY N/A 09/01/2016   Procedure: CRANIOTOMY TUMOR  LEFT PTERIONAL;  Surgeon: Ashok Pall, MD;  Location: [facility];  Service: Neurosurgery;  Laterality: N/A;   cyst removed from chest      as a child   IR FLUORO GUIDE CV LINE LEFT  12/22/2020   IR US GUIDE VASC ACCESS LEFT  12/22/2020   NO PAST SURGERIES     PARTIAL COLECTOMY N/A 09/10/2020   Procedure: TRANSVERSE COLECTOMY;  Surgeon: [name], MD;  Location: [place];   Service: General;  Laterality: N/A;   POLYPECTOMY  09/05/2020   Procedure: POLYPECTOMY;  Surgeon: Irene Shipper, MD;  Location: [place];  Service: Endoscopy;;   PORTACATH PLACEMENT Right 10/01/2020   Procedure: INSERTION PORT-A-CATH;  Surgeon: [name], MD;  Location: [place];  Service: General;  Laterality: Right;   SUBMUCOSAL TATTOO INJECTION  09/05/2020   Procedure: SUBMUCOSAL TATTOO INJECTION;  Surgeon: Irene Shipper, MD;  [facility] ENDOSCOPY;  Service: Endoscopy;;   HPI:  Pt is a 65 y/o male admitted on 7/25 for worsening sepsis vs reaction to chemotherapy with high ostomy output and soft BP. Pt developed worsening hypoxia and was requiring BIPAP w/ FIO2 of 100% and increased work of breathing. SLP consulted due to concern for aspiration. BSE 7/28 recommended NPO status and MBS, but this could not completed subsequently due to lethargy. CXR in AM of 7/30 showed some worsening concern of aspiration. ETT 7/30-8/8; tolerated nasal cannula for about an hour; reintubated 8/8-trach 8/11. Pt pulled out trach x2 8/22; replaced by PCCM. Bronch on 8/22 revealed endotracheal granulation tissue at trach site. PMH: colon cancer on chemo, resection of a frontal meningioma 09/01/2016, Transverse colon cancer, stage IIIb (T3N1a), status post a partial transverse colectomy and end  colostomy 09/10/2020, DM, recent falls, left MCA CVA, decreased appetite and poor intake, dehydration, seizures, obesity, sleep apnea.   Assessment / Plan / Recommendation Clinical Impression  Pt participated in limited clinical swallow evaluation.  He was alert, restrained and with soft mitts on after pulling out trach x2 today.  Trying to communicate; asking for restraints to be removed; visibly upset. Restraints were released while pt could be supervised.  Oral care was provided - removed thick secretions from tongue and lips.  PMV placed; VS were stable, and frequent removal of valve did not reveal signs of air trapping.  Despite access to upper airway, he remains aphonic. He was provided with several ice chips, which he swallowed after significant delay.  He declined further trials and was provided with paper/pen. He wrote legibly, stating he had pain in chest and abdomen.  Provided support. RN notified and came to room - he was repositioned and tracheal suctioned; left with RN in room.   Recommend continuing trials of PMV whenever full supervision can be provided; SLP will continue to follow for readiness for instrumental swallow study.    SLP Visit Diagnosis: Dysphagia, unspecified (R13.10)    Aspiration Risk       Diet Recommendation   Npo with cortrak       Other  Recommendations Oral Care Recommendations: Oral care QID   Follow up Recommendations Other (comment) (tba)      Frequency and Duration min 2x/week  2 weeks       Prognosis Prognosis for Safe Diet Advancement: Fair      Swallow Study   General Date of Onset: 12/04/20 HPI: Pt is a 65 y/o male admitted on 7/25 for worsening sepsis vs reaction to chemotherapy with high ostomy output and soft BP. Pt developed worsening hypoxia and was requiring BIPAP w/ FIO2 of 100% and increased work of breathing. SLP consulted due to concern for aspiration. BSE 7/28 recommended NPO status and MBS, but this could not completed subsequently due to lethargy. CXR in AM of 7/30 showed some worsening concern of aspiration. ETT 7/30-8/8; tolerated nasal cannula for about an hour; reintubated 8/8-trach 8/11. Pt pulled out trach x2 8/22; replaced by PCCM. Notable for endotracheal granulation tissue at trach site. 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 Type of Study: Bedside Swallow Evaluation Diet Prior to  this Study: NPO Temperature Spikes Noted: No Respiratory Status: Trach Collar Behavior/Cognition: Alert;Requires cueing Oral Cavity Assessment: Excessive secretions;Dried secretions Oral Care Completed by SLP: Yes Oral Cavity - Dentition: Missing dentition Self-Feeding Abilities: Needs assist Patient Positioning: Upright in bed Baseline Vocal Quality: Wet Volitional Cough: Wet;Congested Volitional Swallow: Unable to elicit    Oral/Motor/Sensory Function Overall Oral Motor/Sensory Function: Generalized oral weakness   Ice Chips Ice chips: Impaired Oral Phase Functional Implications: Oral holding Pharyngeal Phase Impairments: Suspected delayed Swallow;Wet Vocal Quality;Multiple swallows   Thin Liquid Thin Liquid: Not tested    Nectar Thick Nectar Thick Liquid: Not tested   Honey Thick Honey Thick Liquid: Not tested   Puree Puree: Not tested   Solid     Solid: Not tested      [name] 12/29/2020,3:31 PM  Estill Bamberg L. Tivis Ringer, [place] Office number [phone] Pager [phone]

## 2020-12-29 NOTE — Progress Notes (Addendum)
PROGRESS NOTE        PATIENT DETAILS Name: Thomas [name] Age: 65 y.o. Sex: male Date of Birth: 1955-09-18 Admit Date: 11/30/2020 Admitting Physician Renee Pain, MD UYQ:[id number], Rachel Moulds, NP  Brief Narrative: Patient is a 65 y.o. male with history of colon cancer-s/p partial transverse colectomy/colostomy 09/10/2020-recently started on chemotherapy prior to this hospitalization-who presented to the hospital on 7/24 with abdominal pain, anorexia, weakness-he was thought to have sepsis syndrome-and subsequently started on antimicrobial therapy.  He was initially admitted to the intensive care unit-but stabilized and transferred to the [facility] on 7/27, unfortunately further hospital course was complicated by altered mental status/delirium-septic shock/acute hypoxic respiratory failure in the setting of aspiration pneumonia.  He was transferred to the ICU where he was intubated and did require pressor support.He had a prolonged stay in the ICU-complicated by development of AKI due to ATN requiring initiation of renal replacement therapy.  He eventually failed extubation and required tracheostomy placement.   He was transferred back to the [facility] on 8/17.   Significant events: 7/25>> SIRS-admit to ICU 7/27>> Transferred to [place], stopped abx as sepsis resolved/ruled out 7/30>> worsening hypoxia/new aspiration-worsening renal function-transfer to ICU-intubated  8/08>>failed extubation 8/8-reintubated 8/11>> tracheostomy placement 8/17>> transferred to [facility] 8/22>> pulled trach out x2-replaced by PCCM  Significant studies: 7/26>> Echo: EF 25-95%, grade 1 diastolic dysfunction 6/38>> CT head: Negative for bleed/acute intracranial process. 7/29>> EEG: No seizures. 8/01>> CT abdomen/pelvis: Inflammatory wall thickening/fat stranding around descending/sigmoid colon.  Airspace opacity in the right lower lobe/right middle lobe. 8/05>> bilateral  lower extremity Doppler: DVT right femoral vein, right proximal profunda vein, right popliteal vein 8/19>> CT abdomen/pelvis: Decreased bowel inflammation and fluid filling when compared to prior CT.  No SBO.  Antimicrobial therapy: Unasyn 7/30 >>7/31 Maxepime 7/31 >> 8/7 Flagyl 7/31 >>  8/7 Vancomycin 8/1 only  Anidulafungin 8/1>>8/3 Rocephin: 8/16>>8/17 Cefepime:8/17>>8/21  Microbiology data: 7/24>> blood cultures: Negative 7/25>> urine culture: Negative 7/26>> C. difficile studies: Negative 7/31>> respiratory culture: Moderate Candida tropicalis. 8/01>> GI pathogen panel: Negative 8/15>> tracheal aspirate: Pseudomonas aeruginosa   Procedures : 7/30>> 8/11: ETT 8/11>> tracheostomy placement 8/15>> left IJ TDC  Consults: PCCM, surgery, oncology, nephrology, palliative care  DVT Prophylaxis : apixaban (ELIQUIS) tablet 5 mg    Subjective: Awake-seems to have been intermittently confused-pulled trach out x2 this morning.  Still has a lot of respiratory secretions requiring frequent suctioning.  Assessment/Plan: Septic shock due to aspiration pneumonia: Sepsis physiology has resolved-culture data as above-has completed a course of antimicrobial therapy.  Afebrile-stable hemodynamics but tenuous -plan is to monitor closely-he remains at risk for recurrent aspiration pneumonia given frailty-continues to accumulate secretions.   Acute hypoxic respiratory failure due to aspiration pneumonia: Prolonged ICU stay-failed extubation attempt-subsequently underwent tracheostomy on 8/11.  He stable on trach collar-although tenuous-unfortunately on 8/22 he pulled out trach x2.  Wean off trach-clearly having issues protecting his airway with significant accumulation of secretions.  Tracheostomy put back in place by PCCM x2.  We will use soft wrist restraints and mittens.   Dysphagia: In a setting of acute illness-respiratory failure-tracheostomy-severe deconditioning-on NG tube feedings  currently-await further recommendations from SLP.  Remains NPO-do not think he is ready for a swallow evaluation-continues to have significant respiratory secretions..    Acute metabolic encephalopathy: Continues to wax and wane-confused and pulled out his tracheostomy x2 today.  Encephalopathy is likely due to acute illness-prolonged ICU stay-hypoxemia.  Continue Seroquel and Klonopin-we will need to be cautious with oversedation as well.  AKI on CKD stage IIIb: AKI due to ischemic ATN-per RN-had around 700 cc of acute urinary retention last night.  Nephrology following-we will continue to watch closely for any signs of renal recovery.  Electrolytes stable today.  He is incredibly frail-debilitated-Per nephrology-if he is not able to sit up-he will not be a candidate for long-term dialysis.  Family hoping that with continued supportive care-PT/OT-patient will be able to continue HD long-term if his kidneys do not recover.  Right lower extremity DVT: Continue Eliquis.  PAF with RVR: Provoked by acute illness/sepsis-maintaining sinus rhythm.  Echo with stable EF.  On Eliquis for DVT.  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 Benay Spice.  Colitis: Seen initially on CT abdomen on 8/1-was evaluated by general surgery-with recommendations to continue supportive care.  Since he continued to have abdominal pain-repeat CT was obtained on 8/19 which shows improvement in colitis-no evidence of SBO.  Abdominal pain is relatively stable today.  Continue with as needed narcotics.  Suspect abdominal pain probably related to scarring/underlying colon cancer and resolving colitis.  Abdominal exam is very benign this morning.    Anemia of critical illness: Hemoglobin continues to fluctuate but relatively stable.  No overt blood loss.  Continue periodic CBC monitoring.      Hyponatremia: Mild-will need to be managed with volume removal during HD.  Seizure disorder: Continue Keppra-EEG negative.  History of frontal meningioma-s/p resection 09/01/2016  HLD: Continue statin  DM-2 (A1c 8.7 on 7/25): CBG stable-continue 18 units of Levemir twice daily, 6 units of NovoLog every 4 hours and SSI.  Follow and adjust  Recent Labs    12/29/20 0025 12/29/20 0534 12/29/20 1030  GLUCAP 97 119* 103*     History of diabetic neuropathy: Resume Neurontin when able.  GERD: Continue PPI  Capecitabine induced desquamation/Hand/foot syndrome:Continue moisturizer to hands and feet and scrotum  Severe debility/deconditioning/dysphagia: Due to acute illness-continue PT/OT/SLP follow-up-likely will require SNF on discharge.  Palliative care: Full code for now-Long discussion with spouse on 8/20-she is aware of his tenuous clinical condition and the potential for deterioration-worried that he may aspirate again given waxing and waning encephalopathy.  He is severely debilitated.  Have reconsulted palliative care to continue engagement with the family.  Spoke with patient's spouse today-she is requesting a second opinion at [facility].  Addendum: Received call back from [facility]-they do not have any beds-furthermore they have nothing different to offer-hence have denied request for transfer.  Nutrition Status: Nutrition Problem: Moderate Malnutrition Etiology: chronic illness, cancer and cancer related treatments Signs/Symptoms: mild fat depletion, mild muscle depletion, percent weight loss Percent weight loss: 11 % Interventions: Tube feeding, Prostat  Pressure injury: Pressure Injury 12/01/20 Coccyx Mid Stage 2 -  Partial thickness loss of dermis presenting as a shallow open injury with a red, pink wound bed without slough. stage 2 decubitus to coxxyx (Active)  12/01/20 0900  Location: Coccyx  Location Orientation: Mid  Staging: Stage 2 -  Partial thickness loss of dermis  presenting as a shallow open injury with a red, pink wound bed without slough.  Wound Description (Comments): stage 2 decubitus to coxxyx  Present on Admission: Yes     Pressure Injury 12/14/20 Face Upper;Mid Deep Tissue Pressure Injury - Purple or maroon localized area of discolored intact  skin or blood-filled blister due to damage of underlying soft tissue from pressure and/or shear. circular shape broken area & sloug (Active)  12/14/20 0100  Location: Face  Location Orientation: Upper;Mid  Staging: Deep Tissue Pressure Injury - Purple or maroon localized area of discolored intact skin or blood-filled blister due to damage of underlying soft tissue from pressure and/or shear.  Wound Description (Comments): circular shape broken area & sloughing skin  Present on Admission: No     Pressure Injury 12/14/20 Face Left;Upper Deep Tissue Pressure Injury - Purple or maroon localized area of discolored intact skin or blood-filled blister due to damage of underlying soft tissue from pressure and/or shear. circular shape broken area & slou (Active)  12/14/20 0100  Location: Face  Location Orientation: Left;Upper  Staging: Deep Tissue Pressure Injury - Purple or maroon localized area of discolored intact skin or blood-filled blister due to damage of underlying soft tissue from pressure and/or shear.  Wound Description (Comments): circular shape broken area & sloughing skin  Present on Admission: No     Pressure Injury 12/23/20 Buttocks Left Stage 2 -  Partial thickness loss of dermis presenting as a shallow open injury with a red, pink wound bed without slough. left buttock stage 2 (Active)  12/23/20   Location: Buttocks  Location Orientation: Left  Staging: Stage 2 -  Partial thickness loss of dermis presenting as a shallow open injury with a red, pink wound bed without slough.  Wound Description (Comments): left buttock stage 2  Present on Admission: Yes   Obesity: Estimated body mass index is  35.36 kg/m as calculated from the following:   Height as of this encounter: 5' 8"  (1.727 m).   Weight as of this encounter: 105.5 kg.     Diet: Diet Order             Diet NPO time specified  Diet effective midnight                    Code Status: Full code   Family Communication: Spouse Mahalia Minturn-[phone]-called on 8/22-she is requesting a second opinion at [facility].  I will call the transfer desk later today.   Status is: Inpatient  Remains inpatient appropriate because:Inpatient level of care appropriate due to severity of illness  Dispo: The patient is from: Home              Anticipated d/c is to: SNF              Patient currently is not medically stable to d/c.   Difficult to place patient No   Barriers to Discharge: N.p.o.-NG tube in place-resolving pneumonia-on IV cefepime-severely deconditioned-not yet stable for discharge.  Antimicrobial agents: Anti-infectives (From admission, onward)    Start     Dose/Rate Route Frequency Ordered Stop   12/24/20 1730  ceFEPIme (MAXIPIME) 1 g in sodium chloride 0.9 % 100 mL IVPB        1 g 200 mL/hr over 30 Minutes Intravenous Every 24 hours 12/24/20 1642 12/29/20 0901   12/23/20 0845  cefTRIAXone (ROCEPHIN) 2 g in sodium chloride 0.9 % 100 mL IVPB  Status:  Discontinued        2 g 200 mL/hr over 30 Minutes Intravenous Every 24 hours 12/23/20 0800 12/24/20 1620   12/22/20 0600  ceFAZolin (ANCEF) IVPB 2g/100 mL premix        2 g 200 mL/hr over 30 Minutes Intravenous To Radiology 12/21/20 1503 12/22/20 1149   12/09/20  1800  vancomycin (VANCOREADY) IVPB 1750 mg/350 mL  Status:  Discontinued        1,750 mg 175 mL/hr over 120 Minutes Intravenous Every 24 hours 12/08/20 1357 12/09/20 1403   12/09/20 1700  anidulafungin (ERAXIS) 100 mg in sodium chloride 0.9 % 100 mL IVPB  Status:  Discontinued        100 mg 78 mL/hr over 100 Minutes Intravenous Every 24 hours 12/08/20 1359 12/09/20 1321   12/08/20 1500  vancomycin  (VANCOCIN) 2,500 mg in sodium chloride 0.9 % 500 mL IVPB        2,500 mg 250 mL/hr over 120 Minutes Intravenous  Once 12/08/20 1357 12/08/20 1719   12/08/20 1500  anidulafungin (ERAXIS) 200 mg in sodium chloride 0.9 % 200 mL IVPB        200 mg 78 mL/hr over 200 Minutes Intravenous  Once 12/08/20 1359 12/08/20 1821   12/08/20 0000  ceFEPIme (MAXIPIME) 2 g in sodium chloride 0.9 % 100 mL IVPB  Status:  Discontinued        2 g 200 mL/hr over 30 Minutes Intravenous Every 12 hours 12/07/20 1200 12/14/20 0858   12/07/20 1000  metroNIDAZOLE (FLAGYL) IVPB 500 mg  Status:  Discontinued        500 mg 100 mL/hr over 60 Minutes Intravenous Every 12 hours 12/07/20 0849 12/14/20 0858   12/07/20 0900  ceFEPIme (MAXIPIME) 2 g in sodium chloride 0.9 % 100 mL IVPB  Status:  Discontinued        2 g 200 mL/hr over 30 Minutes Intravenous Every 24 hours 12/07/20 0809 12/07/20 1200   12/07/20 0830  metroNIDAZOLE (FLAGYL) IVPB 500 mg  Status:  Discontinued        500 mg 100 mL/hr over 60 Minutes Intravenous Every 8 hours 12/07/20 0758 12/07/20 0849   12/06/20 0900  Ampicillin-Sulbactam (UNASYN) 3 g in sodium chloride 0.9 % 100 mL IVPB  Status:  Discontinued        3 g 200 mL/hr over 30 Minutes Intravenous Every 12 hours 12/06/20 0832 12/07/20 0757   12/03/20 0800  ceFEPIme (MAXIPIME) 2 g in sodium chloride 0.9 % 100 mL IVPB  Status:  Discontinued        2 g 200 mL/hr over 30 Minutes Intravenous Every 8 hours 12/03/20 0746 12/04/20 1004   12/01/20 1000  ceFEPIme (MAXIPIME) 2 g in sodium chloride 0.9 % 100 mL IVPB  Status:  Discontinued        2 g 200 mL/hr over 30 Minutes Intravenous Every 12 hours 12/01/20 0513 12/03/20 0746   12/01/20 0300  vancomycin (VANCOREADY) IVPB 2000 mg/400 mL        2,000 mg 200 mL/hr over 120 Minutes Intravenous  Once 12/01/20 0255 12/01/20 0528   11/30/20 2245  ceFEPIme (MAXIPIME) 2 g in sodium chloride 0.9 % 100 mL IVPB        2 g 200 mL/hr over 30 Minutes Intravenous STAT  11/30/20 2237 12/01/20 0003        Time spent: 35 minutes-Greater than 50% of this time was spent in counseling, explanation of diagnosis, planning of further management, and coordination of care.  MEDICATIONS: Scheduled Meds:  apixaban  5 mg Per Tube BID   atorvastatin  20 mg Per Tube Daily   chlorhexidine gluconate (MEDLINE KIT)  15 mL Mouth Rinse BID   Chlorhexidine Gluconate Cloth  6 each Topical Q1200   Chlorhexidine Gluconate Cloth  6 each Topical Q0600   clonazepam  1  mg Per Tube TID   feeding supplement (PROSource TF)  45 mL Per Tube TID   fentaNYL  1 patch Transdermal Q72H   fiber  1 packet Per Tube BID   free water  100 mL Per Tube Q4H   glycopyrrolate  1 mg Per Tube BID   hydrocerin   Topical BID   insulin aspart  0-15 Units Subcutaneous Q4H   insulin aspart  6 Units Subcutaneous Q4H   insulin detemir  18 Units Subcutaneous BID   levETIRAcetam  500 mg Per Tube BID   mouth rinse  15 mL Mouth Rinse QID   QUEtiapine  50 mg Per Tube BID   thiamine  100 mg Per Tube Daily   Continuous Infusions:  sodium chloride Stopped (12/20/20 0955)   feeding supplement (OSMOLITE 1.5 CAL) 1,000 mL (12/28/20 1520)   PRN Meds:.sodium chloride, acetaminophen, albuterol, dextrose, docusate, fentaNYL (SUBLIMAZE) injection, heparin sodium (porcine), loperamide, sodium chloride flush   PHYSICAL EXAM: Vital signs: Vitals:   12/29/20 0305 12/29/20 0357 12/29/20 0733 12/29/20 0748  BP: 126/73   101/65  Pulse: (!) 103   97  Resp: 20   20  Temp:    97.9 F (36.6 C)  TempSrc:    Axillary  SpO2: 98% 100% 100% 97%  Weight:      Height:       Filed Weights   12/27/20 1400 12/27/20 1729 12/28/20 0500  Weight: 107.8 kg 104.8 kg 105.5 kg   Body mass index is 35.36 kg/m.   Gen Exam: Awake-following commands-pulled out his trach x2 earlier.  Still accumulating a lot of secretions. HEENT:atraumatic, normocephalic Chest: B/L clear to auscultation anteriorly CVS:S1S2  regular Abdomen:soft non tender, non distended Extremities:no edema Neurology: Non focal Skin: no rash   I have personally reviewed following labs and imaging studies  LABORATORY DATA: CBC: Recent Labs  Lab 12/25/20 0326 12/26/20 0420 12/27/20 0444 12/28/20 0334 12/29/20 0341  WBC 9.6 9.8 12.0* 11.8* 13.0*  HGB 7.6* 8.0* 8.6* 8.3* 8.1*  HCT 23.9* 24.9* 25.0* 25.5* 24.2*  MCV 91.9 89.9 86.8 88.9 88.3  PLT 177 170 172 182 180     Basic Metabolic Panel: Recent Labs  Lab 12/25/20 0326 12/26/20 0420 12/27/20 0444 12/28/20 0334 12/29/20 0341  NA 141 133* 130* 129* 131*  K 4.2 3.9 4.7 3.8 4.2  CL 106 97* 93* 93* 96*  CO2 19* 25 21* 24 22  GLUCOSE 148* 88 112* 144* 139*  BUN 57* 34* 50* 26* 44*  CREATININE 6.06* 3.99* 5.12* 3.40* 4.44*  CALCIUM 8.3* 7.9* 8.3* 8.2* 8.4*  PHOS 6.4* 4.2 5.2* 5.2* 6.0*     GFR: Estimated Creatinine Clearance: 19.8 mL/min (A) (by C-G formula based on SCr of 4.44 mg/dL (H)).  Liver Function Tests: Recent Labs  Lab 12/25/20 0326 12/26/20 0420 12/27/20 0444 12/28/20 0334 12/29/20 0341  ALBUMIN 1.6* 1.7* 1.8* 1.9* 1.7*    No results for input(s): LIPASE, AMYLASE in the last 168 hours. No results for input(s): AMMONIA in the last 168 hours.  Coagulation Profile: No results for input(s): INR, PROTIME in the last 168 hours.  Cardiac Enzymes: No results for input(s): CKTOTAL, CKMB, CKMBINDEX, TROPONINI in the last 168 hours.  BNP (last 3 results) No results for input(s): PROBNP in the last 8760 hours.  Lipid Profile: No results for input(s): CHOL, HDL, LDLCALC, TRIG, CHOLHDL, LDLDIRECT in the last 72 hours.  Thyroid Function Tests: No results for input(s): TSH, T4TOTAL, FREET4, T3FREE, THYROIDAB in the last 72  hours.  Anemia Panel: No results for input(s): VITAMINB12, FOLATE, FERRITIN, TIBC, IRON, RETICCTPCT in the last 72 hours.  Urine analysis:    Component Value Date/Time   COLORURINE YELLOW 12/01/2020 0200    APPEARANCEUR CLEAR 12/01/2020 0200   LABSPEC 1.023 12/01/2020 0200   PHURINE 5.0 12/01/2020 0200   GLUCOSEU >=500 (A) 12/01/2020 0200   HGBUR NEGATIVE 12/01/2020 0200   BILIRUBINUR NEGATIVE 12/01/2020 0200   KETONESUR NEGATIVE 12/01/2020 0200   PROTEINUR 100 (A) 12/01/2020 0200   UROBILINOGEN 0.2 09/27/2014 0301   NITRITE NEGATIVE 12/01/2020 0200   LEUKOCYTESUR NEGATIVE 12/01/2020 0200    Sepsis Labs: Lactic Acid, Venous    Component Value Date/Time   LATICACIDVEN 2.1 ([place]) 12/10/2020 0412    MICROBIOLOGY: Recent Results (from the past 240 hour(s))  Culture, Respiratory w Gram Stain     Status: None   Collection Time: 12/22/20 10:13 AM   Specimen: Tracheal Aspirate; Respiratory  Result Value Ref Range Status   Specimen Description TRACHEAL ASPIRATE  Final   Special Requests NONE  Final   Gram Stain   Final    FEW SQUAMOUS EPITHELIAL CELLS PRESENT FEW WBC SEEN ABUNDANT GRAM POSITIVE COCCI FEW GRAM VARIABLE ROD Performed at [facility], [place] [address], [place], [place] [address]    Culture RARE PSEUDOMONAS AERUGINOSA  Final   Report Status 12/26/2020 FINAL  Final   Organism ID, Bacteria PSEUDOMONAS AERUGINOSA  Final      Susceptibility   Pseudomonas aeruginosa - MIC*    CEFTAZIDIME <=1 SENSITIVE Sensitive     GENTAMICIN <=1 SENSITIVE Sensitive     PIP/TAZO 8 SENSITIVE Sensitive     * RARE PSEUDOMONAS AERUGINOSA    RADIOLOGY STUDIES/RESULTS: No results found.   LOS: 28 days   Oren Binet, MD  [facility]    To contact the attending provider between 7A-7P or the covering provider during after hours 7P-7A, please log into the web site [email or web address] and access using universal [place] password for that web site. If you do not have the password, please call the hospital operator.  12/29/2020, 12:14 PM

## 2020-12-29 NOTE — Progress Notes (Signed)
Called back to bedside for patient removing his trach again. No longer in restraints, RN not sure why. Rts unable to replace trach (but no obturator). Coughing up bloody secretions now.  BP 101/65 (BP Location: Right Arm)   Pulse 97   Temp 97.9 F (36.6 C) (Axillary)   Resp 20   Ht 5\' 8"  (1.727 m)   Wt 105.5 kg   SpO2 97%   BMI 35.36 kg/m   Coughing frequently with airway catheter in place, bloody, frothy secretions. Saturating in upper 90s.  Shiley #6 cuffed trach replaced with obturator. Color change noted. Secretions unchanged after.  CXR to confirm placement since no bronch used this time. Recommend again that he remain in restraints if we are continuing aggressive care.  Julian Hy, DO 12/29/20 12:09 PM [facility]

## 2020-12-30 ENCOUNTER — Other Ambulatory Visit (HOSPITAL_COMMUNITY): Payer: Self-pay

## 2020-12-30 DIAGNOSIS — F419 Anxiety disorder, unspecified: Secondary | ICD-10-CM

## 2020-12-30 LAB — RENAL FUNCTION PANEL
Albumin: 1.7 g/dL — ABNORMAL LOW (ref 3.5–5.0)
Anion gap: 13 (ref 5–15)
BUN: 58 mg/dL — ABNORMAL HIGH (ref 8–23)
CO2: 22 mmol/L (ref 22–32)
Calcium: 8.2 mg/dL — ABNORMAL LOW (ref 8.9–10.3)
Chloride: 96 mmol/L — ABNORMAL LOW (ref 98–111)
Creatinine, Ser: 5.33 mg/dL — ABNORMAL HIGH (ref 0.61–1.24)
GFR, Estimated: 11 mL/min — ABNORMAL LOW (ref >60)
Glucose, Bld: 134 mg/dL — ABNORMAL HIGH (ref 70–99)
Phosphorus: 7.1 mg/dL — ABNORMAL HIGH (ref 2.5–4.6)
Potassium: 4.4 mmol/L (ref 3.5–5.1)
Sodium: 131 mmol/L — ABNORMAL LOW (ref 135–145)

## 2020-12-30 LAB — GLUCOSE, CAPILLARY
Glucose-Capillary: 117 mg/dL — ABNORMAL HIGH (ref 70–99)
Glucose-Capillary: 97 mg/dL (ref 70–99)
Glucose-Capillary: 107 mg/dL — ABNORMAL HIGH (ref 70–99)
Glucose-Capillary: 71 mg/dL (ref 70–99)
Glucose-Capillary: 138 mg/dL — ABNORMAL HIGH (ref 70–99)

## 2020-12-30 MED ORDER — CLONAZEPAM 0.5 MG PO TBDP
1.5000 mg | ORAL_TABLET | Freq: Four times a day (QID) | ORAL | Status: DC
  Administered 2020-12-30 – 2021-01-01 (×8): 1.5 mg
  Filled 2020-12-30 (×8): qty 2

## 2020-12-30 MED ORDER — ADULT MULTIVITAMIN W/MINERALS CH
1.0000 | ORAL_TABLET | Freq: Every day | ORAL | Status: DC
  Administered 2020-12-30 – 2021-01-01 (×3): 1 via ORAL
  Filled 2020-12-30 (×3): qty 1

## 2020-12-30 MED ORDER — FENTANYL CITRATE (PF) 100 MCG/2ML IJ SOLN: 100.0000 ug | INTRAMUSCULAR | Status: DC | PRN

## 2020-12-30 MED ORDER — FENTANYL CITRATE PF 50 MCG/ML IJ SOSY
PREFILLED_SYRINGE | INTRAMUSCULAR | Status: AC
  Administered 2020-12-30: 50 ug
  Filled 2020-12-30: qty 1

## 2020-12-30 MED ORDER — HEPARIN SODIUM (PORCINE) 1000 UNIT/ML IJ SOLN
INTRAMUSCULAR | Status: AC
  Filled 2020-12-30: qty 1

## 2020-12-30 MED ORDER — FENTANYL 25 MCG/HR TD PT72
2.0000 | MEDICATED_PATCH | TRANSDERMAL | Status: DC
  Administered 2020-12-30: 2 via TRANSDERMAL
  Filled 2020-12-30: qty 2

## 2020-12-30 MED ORDER — FENTANYL CITRATE PF 50 MCG/ML IJ SOSY
PREFILLED_SYRINGE | INTRAMUSCULAR | Status: AC
  Administered 2020-12-30: 100 ug
  Filled 2020-12-30: qty 2

## 2020-12-30 MED ORDER — QUETIAPINE FUMARATE 50 MG PO TABS
75.0000 mg | ORAL_TABLET | Freq: Two times a day (BID) | ORAL | Status: DC
  Administered 2020-12-30 – 2021-01-01 (×4): 75 mg
  Filled 2020-12-30 (×4): qty 1

## 2020-12-30 MED ORDER — PANTOPRAZOLE SODIUM 40 MG PO TBEC: 40.0000 mg | DELAYED_RELEASE_TABLET | Freq: Two times a day (BID) | ORAL | Status: DC

## 2020-12-30 MED ORDER — PANTOPRAZOLE SODIUM 40 MG PO PACK
40.0000 mg | PACK | Freq: Two times a day (BID) | ORAL | Status: DC
  Administered 2020-12-30 – 2021-01-16 (×35): 40 mg
  Filled 2020-12-30 (×36): qty 20

## 2020-12-30 MED ORDER — FENTANYL CITRATE (PF) 100 MCG/2ML IJ SOLN
50.0000 ug | INTRAMUSCULAR | Status: DC | PRN
  Administered 2020-12-30 – 2021-01-01 (×9): 100 ug via INTRAVENOUS
  Administered 2021-01-04 – 2021-01-05 (×2): 50 ug via INTRAVENOUS
  Administered 2021-01-05: 250 ug via INTRAVENOUS
  Administered 2021-01-07: 100 ug via INTRAVENOUS
  Filled 2020-12-30 (×10): qty 2

## 2020-12-30 MED ORDER — ASCORBIC ACID 500 MG PO TABS
500.0000 mg | ORAL_TABLET | Freq: Three times a day (TID) | ORAL | Status: DC
  Administered 2020-12-30 – 2021-01-01 (×8): 500 mg via ORAL
  Filled 2020-12-30 (×8): qty 1

## 2020-12-30 MED ORDER — FENTANYL 25 MCG/HR TD PT72
1.0000 | MEDICATED_PATCH | TRANSDERMAL | Status: DC
  Administered 2020-12-30: 1 via TRANSDERMAL
  Filled 2020-12-30 (×2): qty 1

## 2020-12-30 MED ORDER — ACETAMINOPHEN 160 MG/5ML PO SOLN
1000.0000 mg | Freq: Three times a day (TID) | ORAL | Status: DC
  Administered 2020-12-30 – 2021-01-24 (×76): 1000 mg
  Filled 2020-12-30 (×76): qty 40.6

## 2020-12-30 NOTE — Progress Notes (Signed)
PROGRESS NOTE        PATIENT DETAILS Name: Thomas [name] Age: 65 y.o. Sex: male Date of Birth: 1956-02-19 Admit Date: 11/30/2020 Admitting Physician Renee Pain, MD EML:[id number], Rachel Moulds, NP  Brief Narrative: Patient is a 65 y.o. male with history of colon cancer-s/p partial transverse colectomy/colostomy 09/10/2020-recently started on chemotherapy prior to this hospitalization-who presented to the hospital on 7/24 with abdominal pain, anorexia, weakness-he was thought to have sepsis syndrome-and subsequently started on antimicrobial therapy.  He was initially admitted to the intensive care unit-but stabilized and transferred to [facility] on 7/27, unfortunately further hospital course was complicated by altered mental status/delirium-septic shock/acute hypoxic respiratory failure in the setting of aspiration pneumonia.  He was transferred to the ICU where he was intubated and did require pressor support.He had a prolonged stay in the ICU-complicated by development of AKI due to ATN requiring initiation of renal replacement therapy.  He eventually failed extubation and required tracheostomy placement.   He was transferred back to the [facility] on 8/17.   Significant events: 7/25>> SIRS-admit to ICU 7/27>> Transferred to [place], stopped abx as sepsis resolved/ruled out 7/30>> worsening hypoxia/new aspiration-worsening renal function-transfer to ICU-intubated  8/08>>failed extubation 8/8-reintubated 8/11>> tracheostomy placement 8/17>> transferred to [facility] 8/22>> pulled trach out x2-replaced by PCCM  Significant studies: 7/26>> Echo: EF 01-00%, grade 1 diastolic dysfunction 7/12>> CT head: Negative for bleed/acute intracranial process. 7/29>> EEG: No seizures. 8/01>> CT abdomen/pelvis: Inflammatory wall thickening/fat stranding around descending/sigmoid colon.  Airspace opacity in the right lower lobe/right middle lobe. 8/05>> bilateral  lower extremity Doppler: DVT right femoral vein, right proximal profunda vein, right popliteal vein 8/20>> CT abdomen/pelvis: Decreased bowel inflammation and fluid filling when compared to prior CT.  No SBO.  Antimicrobial therapy: Unasyn 7/30 >>7/31 Maxepime 7/31 >> 8/7 Flagyl 7/31 >>  8/7 Vancomycin 8/1 only  Anidulafungin 8/1>>8/3 Rocephin: 8/16>>8/17 Cefepime:8/17>>8/21  Microbiology data: 7/24>> blood cultures: Negative 7/25>> urine culture: Negative 7/26>> C. difficile studies: Negative 7/31>> respiratory culture: Moderate Candida tropicalis. 8/01>> GI pathogen panel: Negative 8/15>> tracheal aspirate: Pseudomonas aeruginosa   Procedures : 7/30>> 8/11: ETT 8/11>> tracheostomy placement 8/15>> left IJ TDC  Consults: PCCM, surgery, oncology, nephrology, palliative care  DVT Prophylaxis : apixaban (ELIQUIS) tablet 5 mg    Subjective: Seen during dialysis-anxious-somewhat agitated.  When asked if he has abdominal pain-he shook his head now-and wanted to be placed in a chair so he could be sitting up.  Later I was informed by RN that he asked his dialysis to be cut off short  Assessment/Plan: Septic shock due to aspiration pneumonia: Sepsis physiology has resolved-culture data as above-has completed a course of antimicrobial therapy.  Afebrile-stable hemodynamics but tenuous that he is still accumulating secretions-and remains at risk for aspiration pneumonia.  Continue to closely monitor  Acute hypoxic respiratory failure due to aspiration pneumonia: Prolonged ICU stay-failed extubation attempt-subsequently underwent tracheostomy on 8/11.  He continues to have secretions and requires frequent suctioning.  On 8/22 he pulled his trach out x2.  Currently on restraints-PCCM following for trach care.    Dysphagia: In a setting of acute illness-respiratory failure-tracheostomy-severe deconditioning-on NG tube feedings currently-await further recommendations from SLP.  Remains  NPO-do not think he is ready for a swallow evaluation-continues to have significant respiratory secretions.  Suspect he will require a PEG tube if this clinical trend continues.  We will need to discuss this with family as well.  Acute metabolic encephalopathy: Etiology felt to be due to acute illness/prolonged ICU stay/hypoxemia/sepsis-continues to have waxing and waning encephalopathy.  Somewhat anxious as well.  Need to be cautious regarding oversedation which can worsen aspiration.  However given that he is agitated/anxious-unable to complete dialysis-we will increase Seroquel to 75 mg twice daily, increase dosage of Klonopin further.  Watch closely-and based on response titrate accordingly.    AKI on CKD stage IIIb: AKI due to ischemic ATN-per RN-around 190 cc of urine overnight on bladder scan-he did not void any urine.  Apparently a couple nights ago-he required in and out catheterization-and 700 cc of urine was obtained.  Getting frequent bladder scans-if urinary retention reoccurs-plan is to place Foley catheter.  Tolerated dialysis for approximately an hour-had around 900 cc of output with dialysis.  Unfortunately-no signs yet of any renal recovery.  Electrolytes remain relatively stable.  Nephrology following.  Per nephrology-if patient remains debilitated-agitated-and is unable to sit up-he will not be a long term candidate for outpatient hemodialysis.  Continue to follow closely-watch for signs of renal recovery.    Right lower extremity DVT: Continue Eliquis.  PAF with RVR: Provoked by acute illness/sepsis-maintaining sinus rhythm.  Echo with stable EF.  On Eliquis for DVT.  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 Benay Spice.  Spoke with oncologist-Dr. Benay Spice on 8/23-no plans to resume chemotherapy-as he has no active cancer.  Per Dr.  Evon Slack has had issues with high output ostomy in the past as well.  I have asked Dr. Benay Spice to reach out to family-see issues below.  Oncology will start following again on 8/24.  Colitis: Seen initially on CT abdomen on 8/1-was evaluated by general surgery-with recommendations to continue supportive care.  Since he continued to have abdominal pain-repeat CT was obtained on 8/ 20 which actually showed improvement in his colitis with no evidence of SBO.  He continues to have abdominal pain-unclear whether this is related to scarring from colectomy/prior cancer.  Abdomen remains soft.  Have reached out to general surgery again today to see if they can reevaluate and see if they would do anything different.  Anemia of critical illness: Hemoglobin continues to fluctuate but relatively stable.  No overt blood loss.  Continue periodic CBC monitoring.     Hyponatremia: Mild-will need to be managed with volume removal during HD.  Seizure disorder: Continue Keppra-EEG negative.  History of frontal meningioma-s/p resection 09/01/2016  HLD: Continue statin  DM-2 (A1c 8.7 on 7/25): CBG stable-continue 18 units of Levemir twice daily, 6 units of NovoLog every 4 hours and SSI.  Follow and adjust  Recent Labs    12/30/20 0449 12/30/20 0806 12/30/20 1228  GLUCAP 117* 97 107*     History of diabetic neuropathy: Resume Neurontin when able.  GERD: Continue PPI  Capecitabine induced desquamation/Hand/foot syndrome:Continue moisturizer to hands and feet and scrotum  Severe debility/deconditioning/dysphagia: Due to acute illness-continue PT/OT/SLP follow-up-likely will require SNF on discharge.  Palliative care:  Given tenuous clinical situation-waxing and waning encephalopathy/accumulating respiratory secretions-ongoing ATN/severe debility anxiety-palliative care was reconsulted over the weekend.  Per palliative care-family desires full scope of treatment-and if only patient remains in the  persistent vegetative state they would consider comfort measures.  This MD has been reaching out to the patient's wife almost on a daily basis-on 8/22-she spoke with the palliative care NP-and requested a  second opinion from [facility] (done on 8/22-transfer declined as lack of beds-and management would not be any different).  She also requested patient to be transferred to another floor (no other PCU beds available) and requested a another hospitalist MD (spoke to [facility] director Dr. Loleta Books 8/22-will be assigned a new hospitalist MD from 8/24). Palliative care will reach out to family today-and continue to engage as much as possible-I have asked Vinie Sill NP with Palliative care to let me know if the family had questions for me today and that a new hospitalist MD will follow patient from 8/24.  Nutrition Status: Nutrition Problem: Moderate Malnutrition Etiology: chronic illness, cancer and cancer related treatments Signs/Symptoms: mild fat depletion, mild muscle depletion, percent weight loss Percent weight loss: 11 % Interventions: Tube feeding, Prostat  Pressure injury: Pressure Injury 12/01/20 Coccyx Mid Stage 2 -  Partial thickness loss of dermis presenting as a shallow open injury with a red, pink wound bed without slough. stage 2 decubitus to coxxyx (Active)  12/01/20 0900  Location: Coccyx  Location Orientation: Mid  Staging: Stage 2 -  Partial thickness loss of dermis presenting as a shallow open injury with a red, pink wound bed without slough.  Wound Description (Comments): stage 2 decubitus to coxxyx  Present on Admission: Yes     Pressure Injury 12/14/20 Face Upper;Mid Deep Tissue Pressure Injury - Purple or maroon localized area of discolored intact skin or blood-filled blister due to damage of underlying soft tissue from pressure and/or shear. circular shape broken area & sloug (Active)  12/14/20 0100  Location: Face  Location Orientation: Upper;Mid  Staging: Deep Tissue Pressure  Injury - Purple or maroon localized area of discolored intact skin or blood-filled blister due to damage of underlying soft tissue from pressure and/or shear.  Wound Description (Comments): circular shape broken area & sloughing skin  Present on Admission: No     Pressure Injury 12/14/20 Face Left;Upper Deep Tissue Pressure Injury - Purple or maroon localized area of discolored intact skin or blood-filled blister due to damage of underlying soft tissue from pressure and/or shear. circular shape broken area & slou (Active)  12/14/20 0100  Location: Face  Location Orientation: Left;Upper  Staging: Deep Tissue Pressure Injury - Purple or maroon localized area of discolored intact skin or blood-filled blister due to damage of underlying soft tissue from pressure and/or shear.  Wound Description (Comments): circular shape broken area & sloughing skin  Present on Admission: No     Pressure Injury 12/23/20 Buttocks Left Stage 2 -  Partial thickness loss of dermis presenting as a shallow open injury with a red, pink wound bed without slough. left buttock stage 2 (Active)  12/23/20   Location: Buttocks  Location Orientation: Left  Staging: Stage 2 -  Partial thickness loss of dermis presenting as a shallow open injury with a red, pink wound bed without slough.  Wound Description (Comments): left buttock stage 2  Present on Admission: Yes   Obesity: Estimated body mass index is 36.07 kg/m as calculated from the following:   Height as of this encounter: 5' 8"  (1.727 m).   Weight as of this encounter: 107.6 kg.     Diet: Diet Order             Diet NPO time specified  Diet effective midnight                    Code Status: Full code   Family Communication: Spouse Mahalia Vacca-[phone]-called on  8/22   Status is: Inpatient  Remains inpatient appropriate because:Inpatient level of care appropriate due to severity of illness  Dispo: The patient is from: Home               Anticipated d/c is to: SNF              Patient currently is not medically stable to d/c.   Difficult to place patient No   Barriers to Discharge: N.p.o.-NG tube in place-AKI/ATN requiring HD  Antimicrobial agents: Anti-infectives (From admission, onward)    Start     Dose/Rate Route Frequency Ordered Stop   12/24/20 1730  ceFEPIme (MAXIPIME) 1 g in sodium chloride 0.9 % 100 mL IVPB        1 g 200 mL/hr over 30 Minutes Intravenous Every 24 hours 12/24/20 1642 12/29/20 0901   12/23/20 0845  cefTRIAXone (ROCEPHIN) 2 g in sodium chloride 0.9 % 100 mL IVPB  Status:  Discontinued        2 g 200 mL/hr over 30 Minutes Intravenous Every 24 hours 12/23/20 0800 12/24/20 1620   12/22/20 0600  ceFAZolin (ANCEF) IVPB 2g/100 mL premix        2 g 200 mL/hr over 30 Minutes Intravenous To Radiology 12/21/20 1503 12/22/20 1149   12/09/20 1800  vancomycin (VANCOREADY) IVPB 1750 mg/350 mL  Status:  Discontinued        1,750 mg 175 mL/hr over 120 Minutes Intravenous Every 24 hours 12/08/20 1357 12/09/20 1403   12/09/20 1700  anidulafungin (ERAXIS) 100 mg in sodium chloride 0.9 % 100 mL IVPB  Status:  Discontinued        100 mg 78 mL/hr over 100 Minutes Intravenous Every 24 hours 12/08/20 1359 12/09/20 1321   12/08/20 1500  vancomycin (VANCOCIN) 2,500 mg in sodium chloride 0.9 % 500 mL IVPB        2,500 mg 250 mL/hr over 120 Minutes Intravenous  Once 12/08/20 1357 12/08/20 1719   12/08/20 1500  anidulafungin (ERAXIS) 200 mg in sodium chloride 0.9 % 200 mL IVPB        200 mg 78 mL/hr over 200 Minutes Intravenous  Once 12/08/20 1359 12/08/20 1821   12/08/20 0000  ceFEPIme (MAXIPIME) 2 g in sodium chloride 0.9 % 100 mL IVPB  Status:  Discontinued        2 g 200 mL/hr over 30 Minutes Intravenous Every 12 hours 12/07/20 1200 12/14/20 0858   12/07/20 1000  metroNIDAZOLE (FLAGYL) IVPB 500 mg  Status:  Discontinued        500 mg 100 mL/hr over 60 Minutes Intravenous Every 12 hours 12/07/20 0849 12/14/20 0858    12/07/20 0900  ceFEPIme (MAXIPIME) 2 g in sodium chloride 0.9 % 100 mL IVPB  Status:  Discontinued        2 g 200 mL/hr over 30 Minutes Intravenous Every 24 hours 12/07/20 0809 12/07/20 1200   12/07/20 0830  metroNIDAZOLE (FLAGYL) IVPB 500 mg  Status:  Discontinued        500 mg 100 mL/hr over 60 Minutes Intravenous Every 8 hours 12/07/20 0758 12/07/20 0849   12/06/20 0900  Ampicillin-Sulbactam (UNASYN) 3 g in sodium chloride 0.9 % 100 mL IVPB  Status:  Discontinued        3 g 200 mL/hr over 30 Minutes Intravenous Every 12 hours 12/06/20 0832 12/07/20 0757   12/03/20 0800  ceFEPIme (MAXIPIME) 2 g in sodium chloride 0.9 % 100 mL IVPB  Status:  Discontinued  2 g 200 mL/hr over 30 Minutes Intravenous Every 8 hours 12/03/20 0746 12/04/20 1004   12/01/20 1000  ceFEPIme (MAXIPIME) 2 g in sodium chloride 0.9 % 100 mL IVPB  Status:  Discontinued        2 g 200 mL/hr over 30 Minutes Intravenous Every 12 hours 12/01/20 0513 12/03/20 0746   12/01/20 0300  vancomycin (VANCOREADY) IVPB 2000 mg/400 mL        2,000 mg 200 mL/hr over 120 Minutes Intravenous  Once 12/01/20 0255 12/01/20 0528   11/30/20 2245  ceFEPIme (MAXIPIME) 2 g in sodium chloride 0.9 % 100 mL IVPB        2 g 200 mL/hr over 30 Minutes Intravenous STAT 11/30/20 2237 12/01/20 0003        Time spent: 35 minutes-Greater than 50% of this time was spent in counseling, explanation of diagnosis, planning of further management, and coordination of care.  MEDICATIONS: Scheduled Meds:  acetaminophen  1,000 mg Per Tube Q8H   apixaban  5 mg Per Tube BID   atorvastatin  20 mg Per Tube Daily   chlorhexidine gluconate (MEDLINE KIT)  15 mL Mouth Rinse BID   Chlorhexidine Gluconate Cloth  6 each Topical Q1200   Chlorhexidine Gluconate Cloth  6 each Topical Q0600   clonazePAM (KLONOPIN) disintegrating tablet 1.5 mg  1.5 mg Per Tube Q6H   feeding supplement (PROSource TF)  45 mL Per Tube TID   fentaNYL  2 patch Transdermal Q72H    fiber  1 packet Per Tube BID   free water  100 mL Per Tube Q4H   glycopyrrolate  1 mg Per Tube BID   heparin sodium (porcine)       hydrocerin   Topical BID   insulin aspart  0-15 Units Subcutaneous Q4H   insulin aspart  6 Units Subcutaneous Q4H   insulin detemir  18 Units Subcutaneous BID   levETIRAcetam  500 mg Per Tube BID   mouth rinse  15 mL Mouth Rinse QID   pantoprazole sodium  40 mg Per Tube BID   QUEtiapine  75 mg Per Tube BID   thiamine  100 mg Per Tube Daily   Continuous Infusions:  sodium chloride Stopped (12/20/20 0955)   feeding supplement (OSMOLITE 1.5 CAL) 1,000 mL (12/28/20 1520)   PRN Meds:.sodium chloride, albuterol, clonazePAM, dextrose, docusate, fentaNYL (SUBLIMAZE) injection, heparin sodium (porcine), loperamide, sodium chloride flush   PHYSICAL EXAM: Vital signs: Vitals:   12/30/20 0930 12/30/20 1028 12/30/20 1155 12/30/20 1400  BP: 114/69 140/72  (!) 141/86  Pulse: 100  99 (!) 112  Resp:  (!) 23 20 18   Temp:  97.9 F (36.6 C)    TempSrc:  Oral    SpO2:  98% 98% 95%  Weight:  107.6 kg    Height:       Filed Weights   12/30/20 0500 12/30/20 0851 12/30/20 1028  Weight: 108.7 kg 108.7 kg 107.6 kg   Body mass index is 36.07 kg/m.   Gen Exam: Restless-somewhat agitated-telling me he wants to set up. HEENT:atraumatic, normocephalic Chest: B/L clear to auscultation anteriorly CVS:S1S2 regular Abdomen: Soft-no peritoneal signs. Extremities:no edema Neurology: Non focal Skin: no rash   I have personally reviewed following labs and imaging studies  LABORATORY DATA: CBC: Recent Labs  Lab 12/25/20 0326 12/26/20 0420 12/27/20 0444 12/28/20 0334 12/29/20 0341  WBC 9.6 9.8 12.0* 11.8* 13.0*  HGB 7.6* 8.0* 8.6* 8.3* 8.1*  HCT 23.9* 24.9* 25.0* 25.5* 24.2*  MCV 91.9  89.9 86.8 88.9 88.3  PLT 177 170 172 182 180     Basic Metabolic Panel: Recent Labs  Lab 12/26/20 0420 12/27/20 0444 12/28/20 0334 12/29/20 0341 12/30/20 0410  NA 133*  130* 129* 131* 131*  K 3.9 4.7 3.8 4.2 4.4  CL 97* 93* 93* 96* 96*  CO2 25 21* 24 22 22   GLUCOSE 88 112* 144* 139* 134*  BUN 34* 50* 26* 44* 58*  CREATININE 3.99* 5.12* 3.40* 4.44* 5.33*  CALCIUM 7.9* 8.3* 8.2* 8.4* 8.2*  PHOS 4.2 5.2* 5.2* 6.0* 7.1*     GFR: Estimated Creatinine Clearance: 16.7 mL/min (A) (by C-G formula based on SCr of 5.33 mg/dL (H)).  Liver Function Tests: Recent Labs  Lab 12/26/20 0420 12/27/20 0444 12/28/20 0334 12/29/20 0341 12/30/20 0410  ALBUMIN 1.7* 1.8* 1.9* 1.7* 1.7*    No results for input(s): LIPASE, AMYLASE in the last 168 hours. No results for input(s): AMMONIA in the last 168 hours.  Coagulation Profile: No results for input(s): INR, PROTIME in the last 168 hours.  Cardiac Enzymes: No results for input(s): CKTOTAL, CKMB, CKMBINDEX, TROPONINI in the last 168 hours.  BNP (last 3 results) No results for input(s): PROBNP in the last 8760 hours.  Lipid Profile: No results for input(s): CHOL, HDL, LDLCALC, TRIG, CHOLHDL, LDLDIRECT in the last 72 hours.  Thyroid Function Tests: No results for input(s): TSH, T4TOTAL, FREET4, T3FREE, THYROIDAB in the last 72 hours.  Anemia Panel: No results for input(s): VITAMINB12, FOLATE, FERRITIN, TIBC, IRON, RETICCTPCT in the last 72 hours.  Urine analysis:    Component Value Date/Time   COLORURINE YELLOW 12/01/2020 0200   APPEARANCEUR CLEAR 12/01/2020 0200   LABSPEC 1.023 12/01/2020 0200   PHURINE 5.0 12/01/2020 0200   GLUCOSEU >=500 (A) 12/01/2020 0200   HGBUR NEGATIVE 12/01/2020 0200   BILIRUBINUR NEGATIVE 12/01/2020 0200   KETONESUR NEGATIVE 12/01/2020 0200   PROTEINUR 100 (A) 12/01/2020 0200   UROBILINOGEN 0.2 09/27/2014 0301   NITRITE NEGATIVE 12/01/2020 0200   LEUKOCYTESUR NEGATIVE 12/01/2020 0200    Sepsis Labs: Lactic Acid, Venous    Component Value Date/Time   LATICACIDVEN 2.1 ([place]) 12/10/2020 0412    MICROBIOLOGY: Recent Results (from the past 240 hour(s))  Culture,  Respiratory w Gram Stain     Status: None   Collection Time: 12/22/20 10:13 AM   Specimen: Tracheal Aspirate; Respiratory  Result Value Ref Range Status   Specimen Description TRACHEAL ASPIRATE  Final   Special Requests NONE  Final   Gram Stain   Final    FEW SQUAMOUS EPITHELIAL CELLS PRESENT FEW WBC SEEN ABUNDANT GRAM POSITIVE COCCI FEW GRAM VARIABLE ROD Performed at [facility], [place] [address] [place], [place] [address]    Culture RARE PSEUDOMONAS AERUGINOSA  Final   Report Status 12/26/2020 FINAL  Final   Organism ID, Bacteria PSEUDOMONAS AERUGINOSA  Final      Susceptibility   Pseudomonas aeruginosa - MIC*    CEFTAZIDIME <=1 SENSITIVE Sensitive     GENTAMICIN <=1 SENSITIVE Sensitive     PIP/TAZO 8 SENSITIVE Sensitive     * RARE PSEUDOMONAS AERUGINOSA    RADIOLOGY STUDIES/RESULTS: No results found.   LOS: 29 days   Oren Binet, MD  [facility]    To contact the attending provider between 7A-7P or the covering provider during after hours 7P-7A, please log into the web site [email or web address] and access using universal [place] password for that web site. If you do not have the password, please  call the hospital operator.  12/30/2020, 2:26 PM

## 2020-12-30 NOTE — TOC Progression Note (Signed)
Transition of Care [facility]) - Progression Note    Patient Details  Name: Thomas [name] MRN: [id number] Date of Birth: [birth year]  [facility] CM/SW [place], [place] Phone Number: 12/30/2020, 12:03 PM  Clinical Narrative:     CSW is notified by RN that pt wife is wanting to speak to social worker about transferring facilities. CSW called pt wife and explained purpose of call. She states that she did not tell the nurse that she wanted to talk to social work about transferring facilities but then states that she has been trying to reach a Education officer, museum for days. She is upset that she was told that social worker Percell Locus is not following up because that is who she was told would contact her. CSW explains that Percell Locus is not scheduled today and is scheduled to be back tomorrow but he offers to assist or answer questions. She explains that she does not want CSW assistance as she wants to talk to [place]. CSW explained multiple times that Percell Locus is not here. She then explained that she wants a hospital transfer to a different state. CSW explained that social work does not arrange hospital to hospital transfers. She is upset about this. CSW explained that such transfers would have to be determined appropriate by MD. CSW explained the general services that CSW provide (SNF transfers) and explained that pt is not stable for SNF at this time. She is upset and explained she is not requesting SNF transfer and she is requesting pt be moved to a hospital in a different state. CSW explains again that he cannot assist with this. She does not name a specific hospital that she wants but names various states including [place] and [place].   Expected Discharge Plan: Home/Self Care Barriers to Discharge: Continued Medical Work up  Expected Discharge Plan and Services Expected Discharge Plan: Home/Self Care   Discharge Planning Services: CM Consult   Living arrangements for the past 2 months: Single Family  Home                                       Social Determinants of Health (SDOH) Interventions    Readmission Risk Interventions No flowsheet data found.

## 2020-12-30 NOTE — Progress Notes (Signed)
PT Cancellation Note  Patient Details Name: Thomas [name] MRN: [id number] DOB: 12-04-1955   Cancelled Treatment:    Reason Eval/Treat Not Completed: Patient at procedure or test/unavailable - at HD, will check back as schedule allows.  Stacie Glaze, PT DPT Acute Rehabilitation Services Pager [phone]  Office [phone]    Louis Matte 12/30/2020, 9:49 AM

## 2020-12-30 NOTE — Progress Notes (Signed)
Progress Note     Subjective: Patient known to our service from prior colectomy/colostomy for stage IIIb colon cancer who was admitted in late July secondary to abdominal pain, renal failure, with subsequent hypoxic respiratory failure.  His initial CT scan did not show any acute intra-abdominal complications.  He developed what was believed to be aspiration PNA and required ventilation.  He has since been trached and is on trach collar.  His kidneys have progressed to renal failure requiring HD.  His abdominal pain persisted after admission and a follow up CT revealed non-specific colitis.  He required bowel rest but this improved and resolved.  He was started on TFs and is on goal rate.  His colostomy output was initially over 1L/day but this has come under better control at less than 1L/day.  The patient has been transferred to [facility] for further HD needs.  The patient apparently continues to report having abdominal pain of unclear etiology.  He is unable to speak so getting a history is very difficult.  During part of my evaluation he states he has no abdominal pain, but is clearly agitated and moving all over in his chair wanting to sit up and move around.  Within 5 minutes he is beating on his abdomen and saying he is having abdominal pain and that as soon as he sits up it feels better.  Even sitting up and his pain better, he remains agitated.  He does states that dialysis makes his pain worse.  He stopped HD 1.5 hrs early today because he was having so much left-sided abdominal pain.  He has a CT scan from several days ago that was unremarkable with no acute findings.  He is tolerating TFs at goal rate and his colostomy is working well and averaging 600-900cc of output a day.  We have been asked to see him again for further thoughts on his abdominal pain.  Objective: Vital signs in last 24 hours: Temp:  [97.7 F (36.5 C)-98.4 F (36.9 C)] 97.9 F (36.6 C) (08/23 1028) Pulse Rate:  [87-112] 112  (08/23 1400) Resp:  [16-37] 18 (08/23 1400) BP: (100-149)/(56-99) 141/86 (08/23 1400) SpO2:  [95 %-100 %] 95 % (08/23 1400) FiO2 (%):  [28 %] 28 % (08/23 1400) Weight:  [107.6 kg-108.7 kg] 107.6 kg (08/23 1028) Last BM Date: 12/30/20  Intake/Output from previous day: 08/22 0701 - 08/23 0700 In: -  Out: 925 [Stool:925] Intake/Output this shift: Total I/O In: -  Out: 6283 [Urine:400; Other:933]  PE: General: uncomfortable, moving around in his chair, sitting up and leaning back, hitting his abdomen, and clearly agitated HEENT: balding place across the back of his head Heart: regular, but tachy when agitate Lungs: CTAB, trach in place on trach collar, suctioned while we were in there Abd: soft, initially nontender, but at other times he admits to centralized abdominal tenderness, +BS, R-sided colostomy working well with thick liquid stool output.  Stoma is pink and viable.  Midline incision is well-healed. Ext: spontaneously moves all extremities Skin: warm, dry, nails brittle appearing Psych: alert, difficult to gage orientation due to limited ability to communicate.  Seems very agitated during our visit.   Lab Results:  Recent Labs    12/28/20 0334 12/29/20 0341  WBC 11.8* 13.0*  HGB 8.3* 8.1*  HCT 25.5* 24.2*  PLT 182 180   BMET Recent Labs    12/29/20 0341 12/30/20 0410  NA 131* 131*  K 4.2 4.4  CL 96* 96*  CO2 22  22  GLUCOSE 139* 134*  BUN 44* 58*  CREATININE 4.44* 5.33*  CALCIUM 8.4* 8.2*   PT/INR No results for input(s): LABPROT, INR in the last 72 hours. CMP     Component Value Date/Time   NA 131 (L) 12/30/2020 0410   K 4.4 12/30/2020 0410   CL 96 (L) 12/30/2020 0410   CO2 22 12/30/2020 0410   GLUCOSE 134 (H) 12/30/2020 0410   BUN 58 (H) 12/30/2020 0410   CREATININE 5.33 (H) 12/30/2020 0410   CREATININE 1.83 (H) 11/19/2020 0914   CALCIUM 8.2 (L) 12/30/2020 0410   PROT 5.6 (L) 12/21/2020 0427   ALBUMIN 1.7 (L) 12/30/2020 0410   AST 16 12/21/2020  0427   AST 12 (L) 11/19/2020 0914   ALT 22 12/21/2020 0427   ALT 19 11/19/2020 0914   ALKPHOS 72 12/21/2020 0427   BILITOT 1.0 12/21/2020 0427   BILITOT 0.7 11/19/2020 0914   GFRNONAA 11 (L) 12/30/2020 0410   GFRNONAA 41 (L) 11/19/2020 0914   GFRAA >60 04/01/2017 1921   Lipase     Component Value Date/Time   LIPASE 24 12/01/2020 0018       Studies/Results: No results found.  Anti-infectives: Anti-infectives (From admission, onward)    Start     Dose/Rate Route Frequency Ordered Stop   12/24/20 1730  ceFEPIme (MAXIPIME) 1 g in sodium chloride 0.9 % 100 mL IVPB        1 g 200 mL/hr over 30 Minutes Intravenous Every 24 hours 12/24/20 1642 12/29/20 0901   12/23/20 0845  cefTRIAXone (ROCEPHIN) 2 g in sodium chloride 0.9 % 100 mL IVPB  Status:  Discontinued        2 g 200 mL/hr over 30 Minutes Intravenous Every 24 hours 12/23/20 0800 12/24/20 1620   12/22/20 0600  ceFAZolin (ANCEF) IVPB 2g/100 mL premix        2 g 200 mL/hr over 30 Minutes Intravenous To Radiology 12/21/20 1503 12/22/20 1149   12/09/20 1800  vancomycin (VANCOREADY) IVPB 1750 mg/350 mL  Status:  Discontinued        1,750 mg 175 mL/hr over 120 Minutes Intravenous Every 24 hours 12/08/20 1357 12/09/20 1403   12/09/20 1700  anidulafungin (ERAXIS) 100 mg in sodium chloride 0.9 % 100 mL IVPB  Status:  Discontinued        100 mg 78 mL/hr over 100 Minutes Intravenous Every 24 hours 12/08/20 1359 12/09/20 1321   12/08/20 1500  vancomycin (VANCOCIN) 2,500 mg in sodium chloride 0.9 % 500 mL IVPB        2,500 mg 250 mL/hr over 120 Minutes Intravenous  Once 12/08/20 1357 12/08/20 1719   12/08/20 1500  anidulafungin (ERAXIS) 200 mg in sodium chloride 0.9 % 200 mL IVPB        200 mg 78 mL/hr over 200 Minutes Intravenous  Once 12/08/20 1359 12/08/20 1821   12/08/20 0000  ceFEPIme (MAXIPIME) 2 g in sodium chloride 0.9 % 100 mL IVPB  Status:  Discontinued        2 g 200 mL/hr over 30 Minutes Intravenous Every 12 hours  12/07/20 1200 12/14/20 0858   12/07/20 1000  metroNIDAZOLE (FLAGYL) IVPB 500 mg  Status:  Discontinued        500 mg 100 mL/hr over 60 Minutes Intravenous Every 12 hours 12/07/20 0849 12/14/20 0858   12/07/20 0900  ceFEPIme (MAXIPIME) 2 g in sodium chloride 0.9 % 100 mL IVPB  Status:  Discontinued  2 g 200 mL/hr over 30 Minutes Intravenous Every 24 hours 12/07/20 0809 12/07/20 1200   12/07/20 0830  metroNIDAZOLE (FLAGYL) IVPB 500 mg  Status:  Discontinued        500 mg 100 mL/hr over 60 Minutes Intravenous Every 8 hours 12/07/20 0758 12/07/20 0849   12/06/20 0900  Ampicillin-Sulbactam (UNASYN) 3 g in sodium chloride 0.9 % 100 mL IVPB  Status:  Discontinued        3 g 200 mL/hr over 30 Minutes Intravenous Every 12 hours 12/06/20 0832 12/07/20 0757   12/03/20 0800  ceFEPIme (MAXIPIME) 2 g in sodium chloride 0.9 % 100 mL IVPB  Status:  Discontinued        2 g 200 mL/hr over 30 Minutes Intravenous Every 8 hours 12/03/20 0746 12/04/20 1004   12/01/20 1000  ceFEPIme (MAXIPIME) 2 g in sodium chloride 0.9 % 100 mL IVPB  Status:  Discontinued        2 g 200 mL/hr over 30 Minutes Intravenous Every 12 hours 12/01/20 0513 12/03/20 0746   12/01/20 0300  vancomycin (VANCOREADY) IVPB 2000 mg/400 mL        2,000 mg 200 mL/hr over 120 Minutes Intravenous  Once 12/01/20 0255 12/01/20 0528   11/30/20 2245  ceFEPIme (MAXIPIME) 2 g in sodium chloride 0.9 % 100 mL IVPB        2 g 200 mL/hr over 30 Minutes Intravenous STAT 11/30/20 2237 12/01/20 0003        Assessment/Plan Stage IIIb colon cancer S/p open partial colectomy with transverse colostomy 09/10/20 Dr. Bobbye Morton Abdominal pain - POD 111 - new CT from several days ago with no acute intra-abdominal findings to suggest any clear indication for his abdominal pain.   -he does suggest that his pain is worse during dialysis.  It is possible that he is having a low flow state causing some intermittent intestinal ischemia which is causing some of his  symptoms.  I do not believe he has ischemic bowel as he is stable with no evidence of an acute abdomen.  His pain at times though is also a bit more vague and changes with positions and seems to come and go without cause. -given his hair, dry skin, brittle nails, we will write for some labs to check Vit C, A, etc.  If some of these are low they can contribute to abdominal pain.  Multivitamin added. -no acute surgical interventions needed at this time. -relayed thoughts to primary service who is going to speak to renal about trying him on filtration but not taking fluid off to see how he does. His I/Os are completely accurate as his intakes aren't completely accurate as his TFs haven't been documented, but he is ultimately about 6L negative and could be contributing to his low flow state. -palliative care just contacted me as well and after I left, their conversation seemed to be geared towards the patient stating he thinks he is having "hunger pains" and wanting to eat.   -will follow   FEN: NPO, Cortrak for TFs VTE: eliquis, SCDs ID: [email or web address] 7/31>>, vanc/eraxis 8/1>> abx complete   - below per primary team - Sepsis vs reaction to chemotherapy with shock - resolved Acute hypoxic respiratory failure/fungal PNA - on trach collar AKI on CKD stage II - on HD A fib with RVR - on amio gtt Hx of MCA CVA Hx of asthma Hx of epilepsy on Keppra T2DM GERD HTN HLD OSA Obesity class II - BMI 39.76  LOS: 29 days    Henreitta Cea, [facility] Surgery 12/30/2020, 3:47 PM Please see Amion for pager number during day hours 7:00am-4:30pm

## 2020-12-30 NOTE — Progress Notes (Addendum)
Hemodialysis- Patient remains agitaed with elevated HR and RR. Continues to bang on side of bed and request to leave. Patient is alert to self and place/procedure/year. Wrote down on paper to "take me off now" and "I cant take no more." Dialysis was stopped per patient request. Remains highly agitated and continues to remove mittens. Last dose fentanyl given at 0945. Unable to continue due to safety and patient wishes. Reported off to [employer].

## 2020-12-30 NOTE — Progress Notes (Signed)
[facility] NEPHROLOGY PROGRESS NOTE  Assessment/ Plan:  # Dialysis dependent AKI/CKD stage IIIb - ischemic ATN in setting of severe sepsis and multisystem organ failure.  Admission creatinine was 2.1 and earlier on CRRT (stopped after 8/9) and transitioned to IHD-underwent left IJ TDC placement on 8/15.  Remains anuric.   On THS here.  He is not able to sit on chair or recliner and is  very debilitated.  Noted palliative care team has seen patient during this hospitalization. Family desires to continue current full scope of treatment.  Cont HD on THS Schedule: 2K, TDC, no heparin    # Altered mental status-with prior history of CVA in 2015 and seizure disorder,.   # Volume overload: cont UF as able  # Sepsis/aspiration pneumonia -hemodynamic status improved/off pressors and has completed the course of  IV cefepime and vancomycin. Per TRH  # Chronic hypoxic respiratory failure -status post tracheostomy and ongoing supplementation via T-bar. Per TRH / CCM  # Stage IIIB colon cancer - sp partial colectomy, colostomy. On chemoRx sp 3 cycles.  # Right leg DVT - anticoagulation per primary team.  # Atrial fibrillation - on Eliquis, rate controlled.  # Anemia of critical illness: Continue to monitor hemoglobin/hematocrit trend-holding erythropoietin with active malignancy.   Subjective:  Seen on HD: 2K, UF goal 2L; Pt awake, hyperactive; AML K 4.4, P 7.1 Cont to be agitated No further UOP reported  Objective Vital signs in last 24 hours: Vitals:   12/30/20 0500 12/30/20 0507 12/30/20 0736 12/30/20 0752  BP:  107/65  (!) 149/99  Pulse:  92 95 100  Resp:  19 18 (!) 23  Temp:  97.7 F (36.5 C)  97.8 F (36.6 C)  TempSrc:  Axillary  Oral  SpO2:  99% 100% 98%  Weight: 108.7 kg     Height:       Weight change:   Intake/Output Summary (Last 24 hours) at 12/30/2020 0908 Last data filed at 12/29/2020 2105 Gross per 24 hour  Intake --  Output 925 ml  Net -925 ml         Labs: Basic Metabolic Panel: Recent Labs  Lab 12/28/20 0334 12/29/20 0341 12/30/20 0410  NA 129* 131* 131*  K 3.8 4.2 4.4  CL 93* 96* 96*  CO2 _0 GLUCOSE 144* 139* 134*  BUN 26* 44* 58*  CREATININE 3.40* 4.44* 5.33*  CALCIUM 8.2* 8.4* 8.2*  PHOS 5.2* 6.0* 7.1*    Liver Function Tests: Recent Labs  Lab 12/28/20 0334 12/29/20 0341 12/30/20 0410  ALBUMIN 1.9* 1.7* 1.7*    No results for input(s): LIPASE, AMYLASE in the last 168 hours. No results for input(s): AMMONIA in the last 168 hours. CBC: Recent Labs  Lab 12/25/20 0326 12/26/20 0420 12/27/20 0444 12/28/20 0334 12/29/20 0341  WBC 9.6 9.8 12.0* 11.8* 13.0*  HGB 7.6* 8.0* 8.6* 8.3* 8.1*  HCT 23.9* 24.9* 25.0* 25.5* 24.2*  MCV 91.9 89.9 86.8 88.9 88.3  PLT 177 170 172 182 180    Cardiac Enzymes: No results for input(s): CKTOTAL, CKMB, CKMBINDEX, TROPONINI in the last 168 hours. CBG: Recent Labs  Lab 12/29/20 1613 12/29/20 2054 12/29/20 2356 12/30/20 0449 12/30/20 0806  GLUCAP 86 109* 123* 117* 97     Iron Studies: No results for input(s): IRON, TIBC, TRANSFERRIN, FERRITIN in the last 72 hours. Studies/Results: No results found.  Medications: Infusions:  sodium chloride Stopped (12/20/20 0955)   feeding supplement (OSMOLITE 1.5 CAL) 1,000 mL (12/28/20  1520)    Scheduled Medications:  apixaban  5 mg Per Tube BID   atorvastatin  20 mg Per Tube Daily   chlorhexidine gluconate (MEDLINE KIT)  15 mL Mouth Rinse BID   Chlorhexidine Gluconate Cloth  6 each Topical Q1200   Chlorhexidine Gluconate Cloth  6 each Topical Q0600   clonazepam  1 mg Per Tube TID   feeding supplement (PROSource TF)  45 mL Per Tube TID   fentaNYL  2 patch Transdermal Q72H   fiber  1 packet Per Tube BID   free water  100 mL Per Tube Q4H   glycopyrrolate  1 mg Per Tube BID   hydrocerin   Topical BID   insulin aspart  0-15 Units Subcutaneous Q4H   insulin aspart  6 Units Subcutaneous Q4H   insulin  detemir  18 Units Subcutaneous BID   levETIRAcetam  500 mg Per Tube BID   mouth rinse  15 mL Mouth Rinse QID   QUEtiapine  50 mg Per Tube BID   thiamine  100 mg Per Tube Daily    have reviewed scheduled and prn medications.  Physical Exam: General: Critically ill looking male on tracheal collar with agitation,  Heart:RRR, s1s2 nl Lungs: Coarse breath sounds anteriorly Abdomen:soft, Non-tender, non-distended Extremities: Trace b/l LE edema  with chronic venous stasis change. Dialysis Access: [facility] in place.    B  12/30/2020,9:08 AM  LOS: 29 days

## 2020-12-30 NOTE — Progress Notes (Signed)
Hemodialysis- Patient arrived to HD unit severly agitated. Given medication upon arrival. Requiring frequent suctioning/ supplies are at bedside. HR and RR elevated. Patient currently cooperative, agreeing to dialysis if medication given.

## 2020-12-30 NOTE — Progress Notes (Signed)
OT Cancellation Note  Patient Details Name: AUDRY PECINA MRN: [id number] DOB: [birth year]   Cancelled Treatment:    Reason Eval/Treat Not Completed: Patient at procedure or test/ unavailable. Off floor for HD. OT to check back as time allows.   [name] OTR/L Supplemental OT, Department of rehab services [phone]  Nickolus Wadding R H. 12/30/2020, 9:24 AM

## 2020-12-30 NOTE — Progress Notes (Signed)
Pt. assisted to the chair with PT and NT.  Pt. wanted to know where his wife was. The NS called pt. Wife and wife called back to unit and stated she would be back up.  This RN informed the pt. that she was coming back, not sure what time. He wanted to speak to her, so we called her back at 1410 and she stated she was on her way back now.  This RN informed the pt. and will continue to wait for her arrival.

## 2020-12-30 NOTE — Progress Notes (Signed)
Physical Therapy Treatment Patient Details Name: Thomas [name] MRN: [id number] DOB: 01/23/1956 Today's Date: 12/30/2020    History of Present Illness 65 y.o. male present to ED 2/24 with high ostomy output. Patient admitted with ischemic ATN in setting of severe sepsis and multisystem organ failure. Hospital admission complicated by volume overload, aspiration pneumonia, acute respiratory failure, AKI and ischemic colitis. S/p trach placement 8/11. CorTrak 8/12. HD initiated 8/12. To IR 8/15 for tunneled HD cath. 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    PT Comments    Pt eager to get OOB, hitting railings of bed and gesturing towards chair. Pt requiring max +3 assist for transfer OOB to recliner, took multiple attempts to successfully transfer from bed to recliner. Pt with tachypnea, tachycardia, pt motioning his heart was racing with hand on chest. Pt up in chair with lift pad under him for transfer back to bed later. SNF remains appropriate.     Follow Up Recommendations  SNF     Equipment Recommendations  Wheelchair (measurements PT);Wheelchair cushion (measurements PT);Hospital bed;Other (comment)    Recommendations for Other Services       Precautions / Restrictions Precautions Precautions: Fall Precaution Comments: cortrak, R subclavian tunneled HD cath Restrictions Weight Bearing Restrictions: No    Mobility  Bed Mobility Overal bed mobility: Needs Assistance Bed Mobility: Rolling;Sidelying to Sit Rolling: Mod assist Sidelying to sit: Max assist       General bed mobility comments: mod-max assist for log roll to EOB, pt utilizing HHA for completion of trunk elevation. Multiple rolls L and R in chair for correct pad placement    Transfers Overall  transfer level: Needs assistance Equipment used: 2 person hand held assist Transfers: Squat Pivot Transfers     Squat pivot transfers: Max assist;+2 physical assistance (+3)     General transfer comment: max +3 for squat pivot to drop arm recliner towards R for bodyweight support, safe pivot to recliner, and repositioning once in recliner. Pt motioning his heart was racing with hand on chest, HR 135 bpm during transfer with RR to 30s  Ambulation/Gait             General Gait Details: nt   Marine scientist Rankin (Stroke Patients Only)       Balance Overall balance assessment: Needs assistance Sitting-balance support: Bilateral upper extremity supported;Feet supported Sitting balance-Leahy Scale: Fair Sitting balance - Comments: able to sit EOB without PT support                                    Cognition Arousal/Alertness: Awake/alert Behavior During Therapy: Flat affect Overall Cognitive Status: No family/caregiver present to determine baseline cognitive functioning                                 General Comments: pt follows commands with increased time and at times repeated cues. Pt perseverative on abdominal discomfort, reaching towards abdomen throughout session.      Exercises      General Comments General comments (skin integrity, edema, etc.): Trach collar: 5L, 28%.  Pertinent Vitals/Pain Pain Assessment: Faces Faces Pain Scale: Hurts even more Pain Location: abdomen Pain Descriptors / Indicators: Grimacing;Discomfort;Guarding Pain Intervention(s): Limited activity within patient's tolerance;Monitored during session;Repositioned    Home Living                      Prior Function            PT Goals (current goals can now be found in the care plan section) Acute Rehab PT Goals Patient Stated Goal: to decrease pain PT Goal Formulation: With patient Time For  Goal Achievement: 01/06/21 Potential to Achieve Goals: Fair Progress towards PT goals: Progressing toward goals    Frequency    Min 2X/week      PT Plan Current plan remains appropriate    Co-evaluation              AM-PAC PT "6 Clicks" Mobility   Outcome Measure  Help needed turning from your back to your side while in a flat bed without using bedrails?: A Lot Help needed moving from lying on your back to sitting on the side of a flat bed without using bedrails?: Total Help needed moving to and from a bed to a chair (including a wheelchair)?: Total Help needed standing up from a chair using your arms (Thomas.g., wheelchair or bedside chair)?: Total Help needed to walk in hospital room?: Total Help needed climbing 3-5 steps with a railing? : Total 6 Click Score: 7    End of Session Equipment Utilized During Treatment: Oxygen Activity Tolerance: Patient tolerated treatment well Patient left: in chair;with chair alarm set;with call bell/phone within reach Nurse Communication: Need for lift equipment;Mobility status (lift pad placed) PT Visit Diagnosis: Muscle weakness (generalized) (M62.81);Difficulty in walking, not elsewhere classified (R26.2)     Time: 7342-8768 PT Time Calculation (min) (ACUTE ONLY): 23 min  Charges:  $Therapeutic Activity: 23-37 mins                     Thomas [name], PT DPT Acute Rehabilitation Services Pager [phone]  Office [phone]     Thomas [name] Thomas [name] 12/30/2020, 3:20 PM

## 2020-12-30 NOTE — Progress Notes (Signed)
Palliative Care Progress Note, Assessment & Plan   Patient Name: Thomas [name]       Date: 12/30/2020 DOB: [birth year]  Age: 65 y.o. MRN#: [id number] Attending Physician: Jonetta Osgood, MD Primary Care Physician: [name], NP Admit Date: 11/30/2020  Reason for Consultation/Follow-up: Establishing goals of care  HPI: HPI: 65 yo male with PMH significant for stage 3b colon cancer, L MCA stroke, frontal brain tumor, seizures, HTN, HLD, sleep apnea, aspiration pneumonia, diabetes admitted 11/30/2020 with abd pain, anorexia, and weakness. Hospitalization complicated by septic shock of uncertain origin (potentially likely colitis vs pneumonia) leading to need for intubation, vasopressors, CRRT. His condition remains tenuous in ICU and has previously failed extubation. Extubated today 12/18/20 but not doing well.    Plan of Care: I met today at [facility] bedside along with wife, Jerlyn Ly. With surgery and nursing staff initially. I had a chance to speak with Mahalia more and she explains that Yidel was in good spirits until the past weekend and she is concerned about negative conversation regarding poor prognosis has made [facility] extremely anxious. Jerlyn Ly shows me notes her husband have written to her noting his fears "they are going to kill me" and "they are being mean." Hersel has never expressed this to me. I reassured Mahalia that there are no changes to his plan of care. We will continue dialysis and current level of care according to their stated goals. I also explain that it is common to have concerns about goals for long term dialysis in his current state but this does not mean that this will be abruptly stopped. I explained the difference of process of transfer to another hospital vs transfer to LTACH/SNF but he would  need to be medically stable to proceed to LTACH/SNF and this is where options are extremely limited for him with trach/dialysis. Mahalia expresses understanding.   He indicates to [place] about his abdominal pain and then eating/drinking and it seems that he is complaining of "hunger pains" and desire to eat drink. I explained that he has to have better control of his secretions in order to safely swallow food/drink and that SLP is working with him but it is not safe to give him food/drink yet. We further discussed increasing his anxiety medication to see if this will help. With this coming to light I will adjust his fentanyl patch back down to previous dosage. I discussed with Mahalia about nursing care and she is pleased with care today. We also discussed beginning fresh tomorrow with new attending who I will speak with and update to the situation. Mahalia also expresses that Nicklous seems more anxious after the evening and discussion about her presence with him overnight may be helpful to his anxiety - I will discuss with nursing leadership to make exception for nighttime visitation given his increased anxiety and safety (he did pull out trach twice).   All questions/concerns addressed to the best of my ability. Emotional support provided.    Exam: Alert, appears oriented as best I can assess. Able to write legibly and appropriate sentences. Very anxious and frustrated. Excess secretions via trach.     Plan: - Full code, full scope.  - CLEAR GOALS: ONLY  IN PERSISTENT VEGETATIVE STATE TO "LET HIM GO." Otherwise desire for continued full scope.  - Wife frustrated with constant discussion and feeling she is trying to be convinced to go against her husband's wishes. Please be aware of this situation and goals prior to initiation of further conversations. This remains unchanged.  - Anxiety: Increase scheduled clonazepam.    45 min   Thank you for allowing the Palliative Medicine Team to assist in the care of  this patient.  [place] Ilsa Iha, FNP-BC Palliative Medicine Team Team Phone # [phone]  Vinie Sill, NP Palliative Medicine Team Pager [phone] (Please see [email or web address] for schedule) Team Phone [phone]    Greater than 50%  of this time was spent counseling and coordinating care related to the above assessment and plan

## 2020-12-30 NOTE — Progress Notes (Signed)
Wife notified that pt requested for dialysis to be stopped before treatment was completed, wife very upset and wishes to see medical team, MD and NP notified, charge nurse aware.

## 2020-12-31 ENCOUNTER — Other Ambulatory Visit (HOSPITAL_COMMUNITY): Payer: Self-pay

## 2020-12-31 LAB — CBC
HCT: 21.9 % — ABNORMAL LOW (ref 39.0–52.0)
Hemoglobin: 7.2 g/dL — ABNORMAL LOW (ref 13.0–17.0)
MCH: 29.4 pg (ref 26.0–34.0)
MCHC: 32.9 g/dL (ref 30.0–36.0)
MCV: 89.4 fL (ref 80.0–100.0)
Platelets: 208 10*3/uL (ref 150–400)
RBC: 2.45 MIL/uL — ABNORMAL LOW (ref 4.22–5.81)
RDW: 17 % — ABNORMAL HIGH (ref 11.5–15.5)
WBC: 12.4 10*3/uL — ABNORMAL HIGH (ref 4.0–10.5)
nRBC: 0 % (ref 0.0–0.2)

## 2020-12-31 LAB — RENAL FUNCTION PANEL
Albumin: 1.7 g/dL — ABNORMAL LOW (ref 3.5–5.0)
Anion gap: 12 (ref 5–15)
BUN: 46 mg/dL — ABNORMAL HIGH (ref 8–23)
CO2: 23 mmol/L (ref 22–32)
Calcium: 8.2 mg/dL — ABNORMAL LOW (ref 8.9–10.3)
Chloride: 96 mmol/L — ABNORMAL LOW (ref 98–111)
Creatinine, Ser: 4.44 mg/dL — ABNORMAL HIGH (ref 0.61–1.24)
GFR, Estimated: 14 mL/min — ABNORMAL LOW (ref >60)
Glucose, Bld: 173 mg/dL — ABNORMAL HIGH (ref 70–99)
Phosphorus: 6.2 mg/dL — ABNORMAL HIGH (ref 2.5–4.6)
Potassium: 4.3 mmol/L (ref 3.5–5.1)
Sodium: 131 mmol/L — ABNORMAL LOW (ref 135–145)

## 2020-12-31 LAB — GLUCOSE, CAPILLARY
Glucose-Capillary: 100 mg/dL — ABNORMAL HIGH (ref 70–99)
Glucose-Capillary: 127 mg/dL — ABNORMAL HIGH (ref 70–99)
Glucose-Capillary: 129 mg/dL — ABNORMAL HIGH (ref 70–99)
Glucose-Capillary: 137 mg/dL — ABNORMAL HIGH (ref 70–99)
Glucose-Capillary: 50 mg/dL — ABNORMAL LOW (ref 70–99)
Glucose-Capillary: 57 mg/dL — ABNORMAL LOW (ref 70–99)
Glucose-Capillary: 78 mg/dL (ref 70–99)
Glucose-Capillary: 88 mg/dL (ref 70–99)
Glucose-Capillary: 90 mg/dL (ref 70–99)

## 2020-12-31 MED ORDER — PROSOURCE TF PO LIQD
45.0000 mL | Freq: Two times a day (BID) | ORAL | Status: DC
  Administered 2020-12-31 – 2021-01-28 (×56): 45 mL
  Filled 2020-12-31 (×58): qty 45

## 2020-12-31 MED ORDER — NEPRO/CARBSTEADY PO LIQD
1000.0000 mL | ORAL | Status: DC
  Administered 2020-12-31 – 2021-01-08 (×9): 1000 mL
  Filled 2020-12-31 (×20): qty 1000

## 2020-12-31 NOTE — Progress Notes (Signed)
HEMATOLOGY-ONCOLOGY PROGRESS NOTE  SUBJECTIVE: Mr. Timberman remains hospitalized with multisystem failure.  I have been following him peripherally over the past 2 weeks while he has been cared for by the critical care, medicine, and nephrology services.  I was asked by the hospitalist to see him and discuss his current status/disposition with his wife.  Mr. Wrage is now on the medical floor.  He is alert.  He complains of "hunger pains "in the abdomen.  No dyspnea or other pain.  His wife is at the bedside  Oncology History  Colon cancer ([place])  09/23/2020 Initial Diagnosis   Colon cancer ([place])   09/23/2020 Cancer Staging   Staging form: Colon and Rectum, AJCC 8th Edition - Pathologic: Stage IIIB (pT3, pN1a, cM0) - Signed by Ladell Pier, MD on 09/23/2020 Total positive nodes: 1 Histologic grading system: 4 grade system Histologic grade (G): G1 Residual tumor (R): R0 - None Lymph-vascular invasion (LVI): LVI not present (absent)/not identified Tumor deposits (TD): Absent Perineural invasion (PNI): Absent Microsatellite instability (MSI): Stable   10/08/2020 -  Chemotherapy    Patient is on Treatment Plan: COLORECTAL XELOX (CAPEOX) Q21D         PHYSICAL EXAMINATION:  Vitals:   12/31/20 1224 12/31/20 1300  BP: 131/79   Pulse: 91 93  Resp: 18 17  Temp: 97.6 F (36.4 C)   SpO2: 100% 100%   Filed Weights   12/30/20 0851 12/30/20 1028 12/31/20 0500  Weight: 239 lb 10.2 oz (108.7 kg) 237 lb 3.4 oz (107.6 kg) 236 lb 12.4 oz (107.4 kg)    Intake/Output from previous day: 08/23 0701 - 08/24 0700 In: 1020 [NG/GT:720] Out: 2008 [Urine:400; Stool:675]  GENERAL: Alert SKIN: Skin breakdown at the hands and scrotum have resolved, superficial desquamation over the soles, no erythema OROPHARYNX: No thrush, tracheostomy in place Cardiovascular-distant heart sounds ABDOMEN: Right abdomen colostomy with loose stool, soft and nontender NEURO: Alert, moves all extremities to  command  Port-A-Cath and hemodialysis catheter without erythema  LABORATORY DATA:  I have reviewed the data as listed CMP Latest Ref Rng & Units 12/31/2020 12/30/2020 12/29/2020  Glucose 70 - 99 mg/dL 173(H) 134(H) 139(H)  BUN 8 - 23 mg/dL 46(H) 58(H) 44(H)  Creatinine 0.61 - 1.24 mg/dL 4.44(H) 5.33(H) 4.44(H)  Sodium 135 - 145 mmol/L 131(L) 131(L) 131(L)  Potassium 3.5 - 5.1 mmol/L 4.3 4.4 4.2  Chloride 98 - 111 mmol/L 96(L) 96(L) 96(L)  CO2 22 - 32 mmol/L 23 22 22   Calcium 8.9 - 10.3 mg/dL 8.2(L) 8.2(L) 8.4(L)  Total Protein 6.5 - 8.1 g/dL - - -  Total Bilirubin 0.3 - 1.2 mg/dL - - -  Alkaline Phos 38 - 126 U/L - - -  AST 15 - 41 U/L - - -  ALT 0 - 44 U/L - - -    Lab Results  Component Value Date   WBC 12.4 (H) 12/31/2020   HGB 7.2 (L) 12/31/2020   HCT 21.9 (L) 12/31/2020   MCV 89.4 12/31/2020   PLT 208 12/31/2020   NEUTROABS 12.1 (H) 12/18/2020    CT ABDOMEN PELVIS WO CONTRAST  Result Date: 12/27/2020 CLINICAL DATA:  Acute nonlocalized abdominal pain EXAM: CT ABDOMEN AND PELVIS WITHOUT CONTRAST TECHNIQUE: Multidetector CT imaging of the abdomen and pelvis was performed following the standard protocol without IV contrast. COMPARISON:  12/08/2020 FINDINGS: Lower chest: Streaky opacity in the lower lobes, improved on the right and new on the left. Central line with tip seen at the right atrium. Hepatobiliary:  No focal liver abnormality.No evidence of biliary obstruction or stone. Pancreas: Unremarkable. Spleen: Unremarkable. Adrenals/Urinary Tract: Negative adrenals. No hydronephrosis or stone. 3.4 cm left renal cystic density. Symmetric perinephric stranding which is nonspecific. Distended bladder without superimposed abnormality. Stomach/Bowel: Partial colectomy . Enteric contrast reaches the transverse colon where it has a somewhat eccentric appearance relative to the central colon, but no perforation or inflammation. Small bowel loops and excluded distal colon have decompressed  since prior with diminished fat inflammation. A feeding tube tip is at the distal stomach. Vascular/Lymphatic: No acute vascular abnormality. Atheromatous calcifications. No mass or adenopathy. Reproductive:No acute finding Other: No ascites or pneumoperitoneum. Fatty bilateral groin hernia. Musculoskeletal: No acute abnormalities. IMPRESSION: 1. Decreased bowel inflammation and fluid filling when compared to CT earlier this month. Negative for obstruction. 2. Lower lobe atelectasis, progressed on the left since prior. 3. Chronic findings are noted above. Electronically Signed   By: Monte Fantasia M.D.   On: 12/27/2020 04:23   CT ABDOMEN PELVIS WO CONTRAST  Result Date: 12/08/2020 CLINICAL DATA:  Abdominal distension, decreasing colostomy output, history of colon cancer status post transverse colectomy EXAM: CT ABDOMEN AND PELVIS WITHOUT CONTRAST TECHNIQUE: Multidetector CT imaging of the abdomen and pelvis was performed following the standard protocol without IV contrast. Oral enteric contrast was administered COMPARISON:  CT chest abdomen pelvis, 12/01/2020 FINDINGS: Lower chest: There is new, dense heterogeneous and consolidative airspace opacity of the right lower and right middle lobes (series 7, image 1). Coronary artery calcifications. Aortic valve calcifications Hepatobiliary: No solid liver abnormality is seen. Hepatic steatosis. No gallstones, gallbladder wall thickening, or biliary dilatation. Pancreas: Unremarkable. No pancreatic ductal dilatation or surrounding inflammatory changes. Spleen: Normal in size without significant abnormality. Adrenals/Urinary Tract: Adrenal glands are unremarkable. Kidneys are normal, without renal calculi, solid lesion, or hydronephrosis. Bladder is unremarkable. Stomach/Bowel: Esophagogastric tube is position with tip and side port below the diaphragm, however folded in the gastric body, with tip in the gastric fundus (series 2, image 17). Status post transverse  colectomy with right upper quadrant colostomy. The proximal colon as well as the excluded colonic remnant are fluid-filled, and there is inflammatory wall thickening and fat stranding about the descending and sigmoid colon (series 2, image 78). Vascular/Lymphatic: Left femoral venous and arterial catheters. Aortic atherosclerosis. Incidental note of retroaortic left renal vein. No enlarged abdominal or pelvic lymph nodes. Reproductive: No mass or other significant abnormality. Other: Mild anasarca. Fat containing left inguinal hernia. No abdominopelvic ascites. Musculoskeletal: No acute or significant osseous findings. IMPRESSION: 1. 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 2. The proximal colon as well as the excluded colonic remnant are fluid-filled, and there is inflammatory wall thickening and fat stranding about the descending and sigmoid colon. Findings are new compared to prior CT dated 12/01/2020, consistent with nonspecific infectious, inflammatory, or ischemic colitis. 3. There is new, dense heterogeneous and consolidative airspace opacity of the right lower and right middle lobes, consistent with infection or aspiration. 4. Esophagogastric tube is positioned with tip and side port below the diaphragm, however folded in the gastric body, with tip in the gastric fundus. Recommend repositioning. 5. Hepatic steatosis. 6. Coronary artery disease. Aortic Atherosclerosis (ICD10-I70.0). Electronically Signed   By: Eddie Candle M.D.   On: 12/08/2020 17:45   DG Chest 1 View  Result Date: 12/15/2020 CLINICAL DATA:  Intubation. EXAM: CHEST  1 VIEW COMPARISON:  12/15/2020 and CT chest 12/01/2020. FINDINGS: Endotracheal tube terminates approximately 3.7 cm above the carina. Right IJ power  port tip is in the low SVC. Heart size stable. Possible mild right infrahilar atelectasis. No pleural fluid. IMPRESSION: 1. Endotracheal tube terminates approximately 3.7 cm above the carina. 2. There  may be mild right infrahilar atelectasis. Electronically Signed   By: Lorin Picket M.D.   On: 12/15/2020 13:20   DG Chest 1 View  Result Date: 12/06/2020 CLINICAL DATA:  Shortness of breath. Patient undergoing chemotherapy for stage IIIB colon cancer. EXAM: CHEST  1 VIEW COMPARISON:  December 04, 2020 FINDINGS: Platelike opacity in the left base consistent with atelectasis or scar, stable. New opacity in the right base obscuring the elevated right hemidiaphragm. The heart, hila, and mediastinum are unchanged and unremarkable. The right Port-A-Cath is stable. No pulmonary nodules or masses. No other abnormalities. IMPRESSION: 1. Stable platelike opacity in the left base consistent with scar or atelectasis. 2. New opacity in the right base obscuring the elevated right hemidiaphragm. The opacity could represent atelectasis versus developing infiltrate. Recommend clinical correlation and short-term follow-up imaging to ensure resolution. Electronically Signed   By: Dorise Bullion III M.D   On: 12/06/2020 11:50   DG Abd 1 View  Result Date: 12/15/2020 CLINICAL DATA:  OG tube placement EXAM: ABDOMEN - 1 VIEW COMPARISON:  12/06/2020 FINDINGS: Enteric tube tip is overlying the distal stomach. Bowel gas pattern is not well evaluated. IMPRESSION: Enteric tube within the distal stomach. Electronically Signed   By: Macy Mis M.D.   On: 12/15/2020 13:35   DG Abd 1 View  Result Date: 12/06/2020 CLINICAL DATA:  NG tube placement. EXAM: ABDOMEN - 1 VIEW COMPARISON:  CT 12/01/2020 FINDINGS: Tip and side port of the enteric tube below the diaphragm in the stomach. Generalized paucity of bowel gas in the upper abdomen. IMPRESSION: Tip and side port of the enteric tube below the diaphragm in the stomach. Electronically Signed   By: Keith Rake M.D.   On: 12/06/2020 23:37   CT HEAD WO CONTRAST  Result Date: 12/05/2020 CLINICAL DATA:  Acute neuro deficit, agitation and delirium, history of colon carcinoma EXAM:  CT HEAD WITHOUT CONTRAST TECHNIQUE: Contiguous axial images were obtained from the base of the skull through the vertex without intravenous contrast. COMPARISON:  09/08/2016 and previous FINDINGS: Brain: Previous left frontal craniotomy. Stable encephalomalacia in left frontal lobe. Negative for acute intracranial hemorrhage, midline shift, no parenchymal edema, mass or mass effect. No hydrocephalus. Mild diffuse parenchymal atrophy as before. Vascular: Atherosclerotic and physiologic intracranial calcifications. Skull: Changes of left temporal craniotomy. No acute fracture or worrisome bone lesion. Sinuses/Orbits: No acute finding. Other: None IMPRESSION: 1. Negative for bleed or other acute intracranial process. 2. Stable changes of left frontal craniotomy Electronically Signed   By: Lucrezia Europe M.D.   On: 12/05/2020 11:34   US RENAL  Result Date: 12/06/2020 CLINICAL DATA:  AK I EXAM: RENAL / URINARY TRACT ULTRASOUND COMPLETE COMPARISON:  Renal ultrasound 10/02/2020 FINDINGS: Right Kidney: Renal measurements: 9.8 x 6.3 x 5.9 cm = volume: 190 mL. Echogenicity within normal limits. No mass or hydronephrosis visualized. Left Kidney: Renal measurements: 11.9 x 7.2 x 5.8 cm = volume: 262 mL. Echogenicity within normal limits. No hydronephrosis. There is a cyst in the lateral aspect of the kidney measuring 4.5 x 4.0 x 4.0 cm. Bladder: Appears decompressed with Foley in place. Other: None. IMPRESSION: No acute abnormality sonographically in the kidneys. Exam is limited by difficulty with patient positioning and shadowing bowel gas. Electronically Signed   By: Audie Pinto M.D.   On:  12/06/2020 11:47   IR Fluoro Guide CV Line Left  Result Date: 12/22/2020 INDICATION: 65 year old male in need of continued hemodialysis. He currently has a right inguinal temporary hemodialysis catheter and presents for placement of a more durable tunneled hemodialysis catheter. EXAM: TUNNELED CENTRAL VENOUS HEMODIALYSIS CATHETER  PLACEMENT WITH ULTRASOUND AND FLUOROSCOPIC GUIDANCE MEDICATIONS: 2 g Ancef. The antibiotic was given in an appropriate time interval prior to skin puncture. ANESTHESIA/SEDATION: Moderate (conscious) sedation was employed during this procedure. A total of Versed 1.5 mg and Fentanyl 75 mcg was administered intravenously. Moderate Sedation Time: 33 minutes. The patient's level of consciousness and vital signs were monitored continuously by radiology nursing throughout the procedure under my direct supervision. FLUOROSCOPY TIME:  Fluoroscopy Time: 1 minutes 48 seconds (14 mGy). COMPLICATIONS: None immediate. PROCEDURE: Informed written consent was obtained from the patient after a discussion of the risks, benefits, and alternatives to treatment. Questions regarding the procedure were encouraged and answered. The left neck and chest were prepped with chlorhexidine in a sterile fashion, and a sterile drape was applied covering the operative field. Maximum barrier sterile technique with sterile gowns and gloves were used for the procedure. A timeout was performed prior to the initiation of the procedure. After creating a small venotomy incision, a micropuncture kit was utilized to access the left internal jugular vein under direct, real-time ultrasound guidance after the overlying soft tissues were anesthetized with 1% lidocaine with epinephrine. Ultrasound image documentation was performed. The microwire was kinked to measure appropriate catheter length. A stiff Glidewire was advanced to the level of the IVC and the micropuncture sheath was exchanged for a peel-away sheath. A palindrome tunneled hemodialysis catheter measuring 23 cm from tip to cuff was tunneled in a retrograde fashion from the anterior chest wall to the venotomy incision. The catheter was then placed through the peel-away sheath with tips ultimately positioned within the superior aspect of the right atrium. Initial aspiration demonstrated excellent flow  through the arterial port but limited flow through the venous port. The catheter was manipulated deeper into the right atrium as well as closer toward the cavoatrial junction while aspiration was performed in the various positions. Ultimately, the venous port could not aspirate effectively. The patient has elevation of the right hemidiaphragm which appears to be elongating and narrowing the right atrium. Therefore, the 23 cm hemodialysis catheter was exchanged over 2 stiff glide wires for a 19 cm tip to cuff palindrome tunneled hemodialysis catheter which was position with the tip higher up at the cavoatrial junction. Aspiration through both the arterial and venous ports was adequate. Final catheter positioning was confirmed and documented with a spot radiographic image. The catheter aspirates and flushes normally. The catheter was flushed with appropriate volume heparin dwells. The catheter exit site was secured with a 0-Prolene retention suture. The venotomy incision was closed with Dermabond. Dressings were applied. The patient tolerated the procedure well without immediate post procedural complication. IMPRESSION: Successful placement of 19 cm tip to cuff tunneled hemodialysis catheter via the left internal jugular vein with tips terminating within the superior aspect of the right atrium. The catheter is ready for immediate use. Of note, it was difficult to find a position for the catheter tip that allowed for good flow through both the venous and arterial ports. Patient may have underlying fibrin sheaths in situ related to the existing right IJ port catheter and perhaps other prior catheter placement. If this new catheter becomes problematic, patient may need either a longer 28 cm catheter through  and through the right atrium and into the hepatic IVC, or a tunneled femoral approach device. Electronically Signed   By: Jacqulynn Cadet M.D.   On: 12/22/2020 12:55   IR US Guide Vasc Access Left  Result Date:  12/22/2020 INDICATION: 65 year old male in need of continued hemodialysis. He currently has a right inguinal temporary hemodialysis catheter and presents for placement of a more durable tunneled hemodialysis catheter. EXAM: TUNNELED CENTRAL VENOUS HEMODIALYSIS CATHETER PLACEMENT WITH ULTRASOUND AND FLUOROSCOPIC GUIDANCE MEDICATIONS: 2 g Ancef. The antibiotic was given in an appropriate time interval prior to skin puncture. ANESTHESIA/SEDATION: Moderate (conscious) sedation was employed during this procedure. A total of Versed 1.5 mg and Fentanyl 75 mcg was administered intravenously. Moderate Sedation Time: 33 minutes. The patient's level of consciousness and vital signs were monitored continuously by radiology nursing throughout the procedure under my direct supervision. FLUOROSCOPY TIME:  Fluoroscopy Time: 1 minutes 48 seconds (14 mGy). COMPLICATIONS: None immediate. PROCEDURE: Informed written consent was obtained from the patient after a discussion of the risks, benefits, and alternatives to treatment. Questions regarding the procedure were encouraged and answered. The left neck and chest were prepped with chlorhexidine in a sterile fashion, and a sterile drape was applied covering the operative field. Maximum barrier sterile technique with sterile gowns and gloves were used for the procedure. A timeout was performed prior to the initiation of the procedure. After creating a small venotomy incision, a micropuncture kit was utilized to access the left internal jugular vein under direct, real-time ultrasound guidance after the overlying soft tissues were anesthetized with 1% lidocaine with epinephrine. Ultrasound image documentation was performed. The microwire was kinked to measure appropriate catheter length. A stiff Glidewire was advanced to the level of the IVC and the micropuncture sheath was exchanged for a peel-away sheath. A palindrome tunneled hemodialysis catheter measuring 23 cm from tip to cuff was  tunneled in a retrograde fashion from the anterior chest wall to the venotomy incision. The catheter was then placed through the peel-away sheath with tips ultimately positioned within the superior aspect of the right atrium. Initial aspiration demonstrated excellent flow through the arterial port but limited flow through the venous port. The catheter was manipulated deeper into the right atrium as well as closer toward the cavoatrial junction while aspiration was performed in the various positions. Ultimately, the venous port could not aspirate effectively. The patient has elevation of the right hemidiaphragm which appears to be elongating and narrowing the right atrium. Therefore, the 23 cm hemodialysis catheter was exchanged over 2 stiff glide wires for a 19 cm tip to cuff palindrome tunneled hemodialysis catheter which was position with the tip higher up at the cavoatrial junction. Aspiration through both the arterial and venous ports was adequate. Final catheter positioning was confirmed and documented with a spot radiographic image. The catheter aspirates and flushes normally. The catheter was flushed with appropriate volume heparin dwells. The catheter exit site was secured with a 0-Prolene retention suture. The venotomy incision was closed with Dermabond. Dressings were applied. The patient tolerated the procedure well without immediate post procedural complication. IMPRESSION: Successful placement of 19 cm tip to cuff tunneled hemodialysis catheter via the left internal jugular vein with tips terminating within the superior aspect of the right atrium. The catheter is ready for immediate use. Of note, it was difficult to find a position for the catheter tip that allowed for good flow through both the venous and arterial ports. Patient may have underlying fibrin sheaths in situ related to  the existing right IJ port catheter and perhaps other prior catheter placement. If this new catheter becomes problematic,  patient may need either a longer 28 cm catheter through and through the right atrium and into the hepatic IVC, or a tunneled femoral approach device. Electronically Signed   By: Jacqulynn Cadet M.D.   On: 12/22/2020 12:55   DG Chest Port 1 View  Result Date: 12/27/2020 CLINICAL DATA:  Dyspnea.  Hypoglycemia EXAM: PORTABLE CHEST 1 VIEW COMPARISON:  12/18/2020 FINDINGS: Patient rotated right. Tracheostomy appropriately position. Right sided Port-A-Cath tip at superior caval atrial junction. Interval placement of a left-sided dialysis catheter with tip at low SVC. Mild cardiomegaly. Mild right hemidiaphragm elevation. No pleural effusion or pneumothorax. Low lung volumes with resultant pulmonary interstitial prominence. No congestive failure. No lobar consolidation. IMPRESSION: Interval placement of a left-sided dialysis catheter, without pneumothorax or other acute complication. Cardiomegaly, without congestive failure or acute disease. Electronically Signed   By: Abigail Miyamoto M.D.   On: 12/27/2020 06:34   DG Chest Port 1 View  Result Date: 12/18/2020 CLINICAL DATA:  Status post tracheostomy tube placement. EXAM: PORTABLE CHEST 1 VIEW COMPARISON:  12/15/2020 FINDINGS: 1415 hours. Tracheostomy tube tip is 5.5 cm above the base of the carina. Low volume film with basilar atelectasis, right greater than left. No pneumothorax or substantial pleural effusion. Right Port-A-Cath remains in place. Telemetry leads overlie the chest. IMPRESSION: Tracheostomy tube tip is 5.5 cm above the base of the carina. Electronically Signed   By: Misty Stanley M.D.   On: 12/18/2020 14:49   DG Chest Port 1 View  Result Date: 12/15/2020 CLINICAL DATA:  Acute respiratory failure with hypoxemia. EXAM: PORTABLE CHEST 1 VIEW COMPARISON:  12/12/2020 FINDINGS: Endotracheal tube is 4.2 cm above the carina. Right jugular central line tip is at the superior cavoatrial junction. No focal lung disease. Negative for a pneumothorax. Heart  size is normal. Nasogastric tube extends into the abdomen and coiled in the proximal stomach region. IMPRESSION: 1. Endotracheal tube is appropriately positioned. 2. No focal lung disease. Electronically Signed   By: Markus Daft M.D.   On: 12/15/2020 07:33   DG CHEST PORT 1 VIEW  Result Date: 12/12/2020 CLINICAL DATA:  Endotracheal tube position EXAM: PORTABLE CHEST 1 VIEW COMPARISON:  Chest radiograph 12/09/2020 FINDINGS: The endotracheal tube tip is in the midthoracic trachea. A right chest wall port is unchanged. The enteric catheter tip is in the stomach. The cardiomediastinal silhouette is stable. There is no focal consolidation or pulmonary edema. There is no pleural effusion or pneumothorax. The bones are stable. IMPRESSION: Endotracheal tube tip in the midthoracic trachea. Electronically Signed   By: Valetta Mole MD   On: 12/12/2020 08:09   DG CHEST PORT 1 VIEW  Result Date: 12/09/2020 CLINICAL DATA:  65 year old male, evaluate for pneumonia. EXAM: PORTABLE CHEST - 1 VIEW COMPARISON:  12/07/2020 FINDINGS: The patient is rotated to the right, limiting of diagnostic evaluation. Unchanged appearance of right chest wall, internal jugular vein approach Port-A-Cath with the catheter tip in the superior right atrium. Endotracheal tube in place with the tip in the inferior thoracic trachea, just above the carina. Gastric decompression tube courses off the inferior aspect of this image, the tip is visualized in the fundus. The mediastinal contours are obscured, though similar to comparison limits. No cardiomegaly. Hazy opacities in the right lung base, limited evaluation due to projection. No acute osseous abnormality. IMPRESSION: 1. Right lower lobe subsegmental atelectasis versus infiltrate. Limited evaluation due to patient  rotation. Recommend attention on follow-up. 2. Support lines and tubes in unchanged, appropriate positions. Electronically Signed   By: Ruthann Cancer MD   On: 12/09/2020 11:15   DG Chest  Port 1 View  Result Date: 12/07/2020 CLINICAL DATA:  Oxygen desaturation. EXAM: PORTABLE CHEST 1 VIEW COMPARISON:  Radiograph yesterday.  CT 12/01/2020 FINDINGS: Endotracheal tube tip 2 cm from the carina. Enteric tube tip below the diaphragm not included in the field of view. Right chest port remains in place. Improved aeration of the right lung base with decreased volume loss. Residual patchy basilar opacity. The heart is normal in size. No pulmonary edema, large pleural effusion or pneumothorax. IMPRESSION: 1. Improved aeration of the right lung base with decreased volume loss since radiograph yesterday. Residual patchy right basilar opacity. 2. Stable support apparatus. Electronically Signed   By: Keith Rake M.D.   On: 12/07/2020 22:12   DG CHEST PORT 1 VIEW  Result Date: 12/06/2020 CLINICAL DATA:  Intubation. EXAM: PORTABLE CHEST 1 VIEW COMPARISON:  Radiograph earlier today.  CT 12/01/2020 FINDINGS: Endotracheal tube tip 19 mm from the carina. Enteric tube in place with tip and side-port below the diaphragm. Right chest port unchanged in position. There is progressive volume loss in the right hemithorax with opacification of the lower thorax and rightward mediastinal shift. Improved linear opacity at the left lung base. No pneumothorax. IMPRESSION: 1. Endotracheal tube tip 19 mm from the carina. Enteric tube in place. 2. Progressive volume loss in the right hemithorax with opacification of the lower thorax and rightward mediastinal shift. Findings favor lobar collapse/atelectasis. Linear opacity at the left lung base is improved from earlier today. Electronically Signed   By: Keith Rake M.D.   On: 12/06/2020 23:40   DG CHEST PORT 1 VIEW  Result Date: 12/04/2020 CLINICAL DATA:  Vomiting with possible aspiration. EXAM: PORTABLE CHEST 1 VIEW COMPARISON:  November 30, 2020 FINDINGS: There is stable right-sided venous Port-A-Cath positioning. Mild linear atelectasis is seen within the mid left  lung. This represents a new finding when compared to the prior exam. There is no evidence of a pleural effusion or pneumothorax. The heart size and mediastinal contours are within normal limits. The visualized skeletal structures are unremarkable. IMPRESSION: Interval development of mild linear atelectasis within the mid left lung since the prior study. Electronically Signed   By: Virgina Norfolk M.D.   On: 12/04/2020 01:19   DG Abd Portable 1V  Result Date: 12/23/2020 CLINICAL DATA:  Abdominal distension EXAM: PORTABLE ABDOMEN - 1 VIEW COMPARISON:  Most recent radiograph 12/19/2020. Most recent CT 12/08/2020 FINDINGS: Weighted enteric tube in place with tip in the right upper quadrant in the region of the distal stomach or proximal duodenum. There is a generalized paucity of bowel gas throughout the abdomen and pelvis. No gaseous bowel distension. Left femoral catheter is in place. No evidence of free air in the supine views. No radiopaque calculi. IMPRESSION: 1. Generalized paucity of bowel gas, nonspecific. There is no gaseous bowel distension. 2. Weighted enteric tube in place with tip in the right upper quadrant in the region of the distal stomach or proximal duodenum. Electronically Signed   By: Keith Rake M.D.   On: 12/23/2020 15:16   DG Abd Portable 1V  Result Date: 12/19/2020 CLINICAL DATA:  Feeding tube placement. EXAM: PORTABLE ABDOMEN - 1 VIEW COMPARISON:  Radiograph 12/15/2020 FINDINGS: New weighted enteric tube in place. The tip is in the right upper quadrant in the region of the distal stomach  or proximal duodenum. Generalized paucity of bowel gas in the included upper abdomen. IMPRESSION: New weighted enteric tube with tip in the right upper quadrant in the region of the distal stomach or proximal duodenum. Electronically Signed   By: Keith Rake M.D.   On: 12/19/2020 16:02   EEG adult  Result Date: 12/05/2020 Lora Havens, MD     12/05/2020  6:19 PM Patient Name: KELON EASOM MRN: [id number] Epilepsy Attending: Lora Havens Referring Physician/Provider: Dr Sander Radon Date: 12/05/2020 Duration: 23.18 mins Patient history: 65yo M withh/o epilepsy presented with ams. EEG to evaluate for seizure Level of alertness: Awake AEDs during EEG study: LEV Technical aspects: This EEG study was done with scalp electrodes positioned according to the 10-20 International system of electrode placement. Electrical activity was acquired at a sampling rate of 500Hz  and reviewed with a high frequency filter of 70Hz  and a low frequency filter of 1Hz . EEG data were recorded continuously and digitally stored. Description: No posterior dominant rhythm was seen. EEG showed continuous generalized 3 to 6 Hz theta-delta slowing. There is also 3-5hz  sharply contoured theta-delta slowing with overriding 13-15hz  beta activity in left frontotemporal region consistent with breach artifact.  Hyperventilation and photic stimulation were not performed.   ABNORMALITY - Continuous slow, generalized - Breach artifact, left frontotemporal region IMPRESSION: This study is suggestive of cortical dysfunction arising from left frontotemporal region likely secondary to underlying encephalomalacia as well as prior craniotomy. There is also moderate diffuse encephalopathy, nonspecific etiology. No seizures or definite epileptiform discharges were seen throughout the recording. Lora Havens   ECHOCARDIOGRAM COMPLETE  Result Date: 12/02/2020    ECHOCARDIOGRAM REPORT   Patient Name:   DELONTAE LAMM Date of Exam: 12/02/2020 Medical Rec #:  [id number]    Height:       68.0 in Accession #:    [id number]   Weight:       246.5 lb Date of Birth:  May 25, 1955    BSA:          2.234 m Patient Age:    69 years     BP:           136/77 mmHg Patient Gender: M            HR:           92 bpm. Exam Location:  Inpatient Procedure: 2D Echo, Color Doppler and Cardiac Doppler Indications:    I48.91* Unspecified atrial fibrillation   History:        Patient has no prior history of Echocardiogram examinations.                 Arrythmias:Atrial Fibrillation; Risk Factors:Hypertension,                 Diabetes, Dyslipidemia and Sleep Apnea.  Sonographer:    Raquel Sarna Senior RDCS Referring Phys: [phone] [facility]  Sonographer Comments: Technically difficult due to patient body habitus. IMPRESSIONS  1. Mild intracavitary gradient. Peak velocity 1.37 m/s. Peak gradient 7.5 mmHg. Left ventricular ejection fraction, by estimation, is 60 to 65%. The left ventricle has normal function. The left ventricle has no regional wall motion abnormalities. There is moderate concentric left ventricular hypertrophy. Left ventricular diastolic parameters are consistent with Grade I diastolic dysfunction (impaired relaxation).  2. Right ventricular systolic function is normal. The right ventricular size is normal. There is normal pulmonary artery systolic pressure.  3. The mitral valve is normal in structure. No evidence of mitral valve  regurgitation. No evidence of mitral stenosis.  4. The aortic valve is calcified. There is moderate calcification of the aortic valve. Aortic valve regurgitation is not visualized. No aortic stenosis is present.  5. The inferior vena cava is normal in size with greater than 50% respiratory variability, suggesting right atrial pressure of 3 mmHg. FINDINGS  Left Ventricle: Mild intracavitary gradient. Peak velocity 1.37 m/s. Peak gradient 7.5 mmHg. Left ventricular ejection fraction, by estimation, is 60 to 65%. The left ventricle has normal function. The left ventricle has no regional wall motion abnormalities. The left ventricular internal cavity size was normal in size. There is moderate concentric left ventricular hypertrophy. Left ventricular diastolic parameters are consistent with Grade I diastolic dysfunction (impaired relaxation). Indeterminate filling pressures. Right Ventricle: The right ventricular size is normal. No increase  in right ventricular wall thickness. Right ventricular systolic function is normal. There is normal pulmonary artery systolic pressure. The tricuspid regurgitant velocity is 1.72 m/s, and  with an assumed right atrial pressure of 8 mmHg, the estimated right ventricular systolic pressure is 00.9 mmHg. Left Atrium: Left atrial size was normal in size. Right Atrium: Right atrial size was normal in size. Pericardium: There is no evidence of pericardial effusion. Mitral Valve: The mitral valve is normal in structure. No evidence of mitral valve regurgitation. No evidence of mitral valve stenosis. Tricuspid Valve: The tricuspid valve is normal in structure. Tricuspid valve regurgitation is trivial. No evidence of tricuspid stenosis. Aortic Valve: The aortic valve is calcified. There is moderate calcification of the aortic valve. Aortic valve regurgitation is not visualized. No aortic stenosis is present. Pulmonic Valve: The pulmonic valve was normal in structure. Pulmonic valve regurgitation is not visualized. No evidence of pulmonic stenosis. Aorta: The aortic root is normal in size and structure. Venous: The inferior vena cava is normal in size with greater than 50% respiratory variability, suggesting right atrial pressure of 3 mmHg. IAS/Shunts: No atrial level shunt detected by color flow Doppler.  LEFT VENTRICLE PLAX 2D LVIDd:         3.20 cm  Diastology LVIDs:         2.10 cm  LV e' medial:    4.68 cm/s LV PW:         1.30 cm  LV E/e' medial:  11.5 LV IVS:        1.50 cm  LV e' lateral:   6.85 cm/s LVOT diam:     2.00 cm  LV E/e' lateral: 7.9 LV SV:         43 LV SV Index:   19 LVOT Area:     3.14 cm  RIGHT VENTRICLE RV S prime:     13.50 cm/s TAPSE (M-mode): 1.6 cm LEFT ATRIUM             Index       RIGHT ATRIUM           Index LA diam:        3.20 cm 1.43 cm/m  RA Area:     12.50 cm LA Vol (A2C):   46.5 ml 20.82 ml/m RA Volume:   26.60 ml  11.91 ml/m LA Vol (A4C):   33.6 ml 15.04 ml/m LA Biplane Vol: 41.5 ml  18.58 ml/m  AORTIC VALVE LVOT Vmax:   98.65 cm/s LVOT Vmean:  79.600 cm/s LVOT VTI:    0.138 m  AORTA Ao Root diam: 3.10 cm MITRAL VALVE  TRICUSPID VALVE MV Area (PHT): 3.10 cm     TR Peak grad:   11.8 mmHg MV Decel Time: 245 msec     TR Vmax:        172.00 cm/s MV E velocity: 54.00 cm/s MV A velocity: 102.00 cm/s  SHUNTS MV E/A ratio:  0.53         Systemic VTI:  0.14 m                             Systemic Diam: 2.00 cm Skeet Latch MD Electronically signed by Skeet Latch MD Signature Date/Time: 12/02/2020/5:47:16 PM    Final    VAS [place] LOWER EXTREMITY VENOUS (DVT)  Result Date: 12/12/2020  Lower Venous DVT Study Patient Name:  KHYLER ESCHMANN  Date of Exam:   12/12/2020 Medical Rec #: [id number]     Accession #:    [id number] Date of Birth: 07-28-1955     Patient Gender: M Patient Age:   2 years Exam Location:  [facility] Procedure:      VAS [place] LOWER EXTREMITY VENOUS (DVT) Referring Phys: MURALI RAMASWAMY --------------------------------------------------------------------------------  Indications: Edema.  Limitations: Body habitus, poor ultrasound/tissue interface and continuous HD to left groin. Comparison Study: Previous exam 1.5.2013 negative for DVT Performing Technologist: Jody Hill RVT, RDMS  Examination Guidelines: A complete evaluation includes B-mode imaging, spectral Doppler, color Doppler, and power Doppler as needed of all accessible portions of each vessel. Bilateral testing is considered an integral part of a complete examination. Limited examinations for reoccurring indications may be performed as noted. The reflux portion of the exam is performed with the patient in reverse Trendelenburg.  +---------+---------------+---------+-----------+----------+--------------+ RIGHT    CompressibilityPhasicitySpontaneityPropertiesThrombus Aging +---------+---------------+---------+-----------+----------+--------------+ CFV      Partial        Yes      Yes                   Acute          +---------+---------------+---------+-----------+----------+--------------+ SFJ      Partial                                      Acute          +---------+---------------+---------+-----------+----------+--------------+ FV Prox  None           No       No                   Acute          +---------+---------------+---------+-----------+----------+--------------+ FV Mid   Full           Yes      Yes                                 +---------+---------------+---------+-----------+----------+--------------+ FV DistalFull           Yes      Yes                                 +---------+---------------+---------+-----------+----------+--------------+ PFV      None           No       No  Acute          +---------+---------------+---------+-----------+----------+--------------+ POP      None           No       No                   Acute          +---------+---------------+---------+-----------+----------+--------------+ PTV                                                   Not visualized +---------+---------------+---------+-----------+----------+--------------+ PERO                                                  Not visualized +---------+---------------+---------+-----------+----------+--------------+   Right Technical Findings: Not visualized segments include peroneal and posterior tibial veins.  +---------+---------------+---------+-----------+----------+--------------+ LEFT     CompressibilityPhasicitySpontaneityPropertiesThrombus Aging +---------+---------------+---------+-----------+----------+--------------+ CFV      Full           Yes      Yes                                 +---------+---------------+---------+-----------+----------+--------------+ SFJ      Full                                                        +---------+---------------+---------+-----------+----------+--------------+ FV  Prox  Full           Yes      Yes                                 +---------+---------------+---------+-----------+----------+--------------+ FV Mid   Full           Yes      Yes                                 +---------+---------------+---------+-----------+----------+--------------+ FV DistalFull           Yes      Yes                                 +---------+---------------+---------+-----------+----------+--------------+ PFV      Full                                                        +---------+---------------+---------+-----------+----------+--------------+ POP      Full           Yes      Yes                                 +---------+---------------+---------+-----------+----------+--------------+ PTV  Full                                                        +---------+---------------+---------+-----------+----------+--------------+ PERO                                                  Not visualized +---------+---------------+---------+-----------+----------+--------------+ CFV, SFJ, PFV, and FV prox not well visualized due to HD placement. PopV difficult to image due to patient position.  Left Technical Findings: Not visualized segments include peroneal veins.   Summary: BILATERAL: - No evidence of superficial venous thrombosis in the lower extremities, bilaterally. -No evidence of popliteal cyst, bilaterally. RIGHT: - Findings consistent with acute deep vein thrombosis involving the right common femoral vein, SF junction, right femoral vein (prox), right proximal profunda vein, and right popliteal vein. - Portions of this examination were limited- see technologist comments above.  LEFT: - Portions of this examination were limited- see technologist comments above. - There is no evidence of deep vein thrombosis in the lower extremity.  *See table(s) above for measurements and observations. Electronically signed by Jamelle Haring on 12/12/2020 at  5:32:15 PM.    Final     ASSESSMENT AND PLAN: Transverse colon cancer, stage IIIb (T3N1a), status post a partial transverse colectomy and end colostomy 09/10/2020 CT abdomen/pelvis 09/04/2020- possible transverse colon mass with small regional lymph nodes Colonoscopy 09/05/2020- obstructing transverse colon mass-biopsy invasive adenocarcinoma, mass could not be passed CT chest 09/09/2020-no evidence of metastatic disease, 3 x 2 mm subpleural nodule in the right upper lobe likely benign subpleural lymph node Partial transverse colectomy 09/10/2020, transverse colon tumor, no lymphovascular perineural invasion, 1/19 lymph nodes positive, negative margins, MSI stable, no loss of mismatch repair protein expression Cycle 1 CAPOX 10/08/2020, oxaliplatin dose reduced to 100 mg per metered squared secondary to renal insufficiency Cycle 2 CAPOX 10/29/2020 Cycle 3 CAPOX 11/19/2020, capecitabine dose reduced secondary to hand/foot syndrome, oxaliplatin dose reduced secondary to renal failure and poor performance status CT chest/abdomen/pelvis without contrast 12/01/2020-no acute abnormality in the chest, abdomen, or pelvis.  No evidence of obstruction. Resection of a frontal meningioma 09/01/2016 Diabetes Rectal polyp- tubular adenoma on colonoscopy 09/05/2020 Hypertension Diabetic neuropathy Epilepsy Family history of breast and prostate cancer Admission with acute renal failure 10/01/2020-secondary to lack of oral intake and lisinopril, improved with intravenous hydration and holding lisinopril Admission 12/01/2020-sepsis, diarrhea HHS, A. fib with RVR, abdominal pain 11.  Respiratory failure requiring intubation 12/06/2020 Tracheostomy 12/18/2020 12.  Progressive renal failure-CRRT started 12/06/2020, discontinued after 12/16/2020, hemodialysis initiated 13.  Thrombocytopenia secondary to chemotherapy and sepsis, heparin-induced platelet antibody and SRA negative-resolved 14.  Right lower extremity DVT diagnosed  12/12/2020-on apixaban  15.  Anemia secondary to chronic disease, renal failure, and phlebotomy   Mr. Iten remains hospitalized with multisystem failure.  He continues to have renal failure requiring hemodialysis.  He underwent a tracheostomy and continues to require oxygen support.  He has difficulty with secretions.  He remains n.p.o. with NG tube feedings in place.  The chemotherapy induced skin changes have resolved.  He is very debilitated secondary to the lengthy hospital admission and multiple comorbid conditions.  There was no evidence of an active malignancy.  I discussed the current situation with Mr. Klayman and his wife.  He understands he will need to achieve a significant improvement in his performance status in order to return home.  He is not able to undergo outpatient hemodialysis in his current condition.  He and his wife indicate he wishes to continue interventions to try and improve his status to where he can be discharged from the hospital.  Recommendations: Continue management of respiratory and renal failure per the medical service and nephrology Increase out of bed, physical activity as tolerated Physical therapy/speech therapy, PMV when tolerated I will continue checking on him intermittently.  I am available to consult with his family as needed.   LOS: 30 days   Betsy Coder 12/31/20

## 2020-12-31 NOTE — Progress Notes (Signed)
Progress Note     Subjective: Patient resting comfortably in bed.  Denies any abdominal pain currently or today.  Tolerating TFs at goal rate  Objective: Vital signs in last 24 hours: Temp:  [98 F (36.7 C)-98.3 F (36.8 C)] 98.3 F (36.8 C) (08/24 0833) Pulse Rate:  [88-112] 91 (08/24 0901) Resp:  [15-20] 16 (08/24 0901) BP: (133-143)/(77-86) 143/77 (08/24 0833) SpO2:  [94 %-100 %] 100 % (08/24 0833) FiO2 (%):  [28 %] 28 % (08/24 0901) Weight:  [107.4 kg] 107.4 kg (08/24 0500) Last BM Date: 12/30/20  Intake/Output from previous day: 08/23 0701 - 08/24 0700 In: 1020 [NG/GT:720] Out: 2008 [Urine:400; Stool:675] Intake/Output this shift: Total I/O In: -  Out: 250 [Stool:250]  PE: General: NAD, resting in bed Abd: soft, NT, NT, colostomy with appropriate output.   Lab Results:  Recent Labs    12/29/20 0341 12/31/20 0431  WBC 13.0* 12.4*  HGB 8.1* 7.2*  HCT 24.2* 21.9*  PLT 180 208   BMET Recent Labs    12/30/20 0410 12/31/20 0431  NA 131* 131*  K 4.4 4.3  CL 96* 96*  CO2 22 23  GLUCOSE 134* 173*  BUN 58* 46*  CREATININE 5.33* 4.44*  CALCIUM 8.2* 8.2*   PT/INR No results for input(s): LABPROT, INR in the last 72 hours. CMP     Component Value Date/Time   NA 131 (L) 12/31/2020 0431   K 4.3 12/31/2020 0431   CL 96 (L) 12/31/2020 0431   CO2 23 12/31/2020 0431   GLUCOSE 173 (H) 12/31/2020 0431   BUN 46 (H) 12/31/2020 0431   CREATININE 4.44 (H) 12/31/2020 0431   CREATININE 1.83 (H) 11/19/2020 0914   CALCIUM 8.2 (L) 12/31/2020 0431   PROT 5.6 (L) 12/21/2020 0427   ALBUMIN 1.7 (L) 12/31/2020 0431   AST 16 12/21/2020 0427   AST 12 (L) 11/19/2020 0914   ALT 22 12/21/2020 0427   ALT 19 11/19/2020 0914   ALKPHOS 72 12/21/2020 0427   BILITOT 1.0 12/21/2020 0427   BILITOT 0.7 11/19/2020 0914   GFRNONAA 14 (L) 12/31/2020 0431   GFRNONAA 41 (L) 11/19/2020 0914   GFRAA >60 04/01/2017 1921   Lipase     Component Value Date/Time   LIPASE 24  12/01/2020 0018       Studies/Results: No results found.  Anti-infectives: Anti-infectives (From admission, onward)    Start     Dose/Rate Route Frequency Ordered Stop   12/24/20 1730  ceFEPIme (MAXIPIME) 1 g in sodium chloride 0.9 % 100 mL IVPB        1 g 200 mL/hr over 30 Minutes Intravenous Every 24 hours 12/24/20 1642 12/29/20 0901   12/23/20 0845  cefTRIAXone (ROCEPHIN) 2 g in sodium chloride 0.9 % 100 mL IVPB  Status:  Discontinued        2 g 200 mL/hr over 30 Minutes Intravenous Every 24 hours 12/23/20 0800 12/24/20 1620   12/22/20 0600  ceFAZolin (ANCEF) IVPB 2g/100 mL premix        2 g 200 mL/hr over 30 Minutes Intravenous To Radiology 12/21/20 1503 12/22/20 1149   12/09/20 1800  vancomycin (VANCOREADY) IVPB 1750 mg/350 mL  Status:  Discontinued        1,750 mg 175 mL/hr over 120 Minutes Intravenous Every 24 hours 12/08/20 1357 12/09/20 1403   12/09/20 1700  anidulafungin (ERAXIS) 100 mg in sodium chloride 0.9 % 100 mL IVPB  Status:  Discontinued  100 mg 78 mL/hr over 100 Minutes Intravenous Every 24 hours 12/08/20 1359 12/09/20 1321   12/08/20 1500  vancomycin (VANCOCIN) 2,500 mg in sodium chloride 0.9 % 500 mL IVPB        2,500 mg 250 mL/hr over 120 Minutes Intravenous  Once 12/08/20 1357 12/08/20 1719   12/08/20 1500  anidulafungin (ERAXIS) 200 mg in sodium chloride 0.9 % 200 mL IVPB        200 mg 78 mL/hr over 200 Minutes Intravenous  Once 12/08/20 1359 12/08/20 1821   12/08/20 0000  ceFEPIme (MAXIPIME) 2 g in sodium chloride 0.9 % 100 mL IVPB  Status:  Discontinued        2 g 200 mL/hr over 30 Minutes Intravenous Every 12 hours 12/07/20 1200 12/14/20 0858   12/07/20 1000  metroNIDAZOLE (FLAGYL) IVPB 500 mg  Status:  Discontinued        500 mg 100 mL/hr over 60 Minutes Intravenous Every 12 hours 12/07/20 0849 12/14/20 0858   12/07/20 0900  ceFEPIme (MAXIPIME) 2 g in sodium chloride 0.9 % 100 mL IVPB  Status:  Discontinued        2 g 200 mL/hr over 30  Minutes Intravenous Every 24 hours 12/07/20 0809 12/07/20 1200   12/07/20 0830  metroNIDAZOLE (FLAGYL) IVPB 500 mg  Status:  Discontinued        500 mg 100 mL/hr over 60 Minutes Intravenous Every 8 hours 12/07/20 0758 12/07/20 0849   12/06/20 0900  Ampicillin-Sulbactam (UNASYN) 3 g in sodium chloride 0.9 % 100 mL IVPB  Status:  Discontinued        3 g 200 mL/hr over 30 Minutes Intravenous Every 12 hours 12/06/20 0832 12/07/20 0757   12/03/20 0800  ceFEPIme (MAXIPIME) 2 g in sodium chloride 0.9 % 100 mL IVPB  Status:  Discontinued        2 g 200 mL/hr over 30 Minutes Intravenous Every 8 hours 12/03/20 0746 12/04/20 1004   12/01/20 1000  ceFEPIme (MAXIPIME) 2 g in sodium chloride 0.9 % 100 mL IVPB  Status:  Discontinued        2 g 200 mL/hr over 30 Minutes Intravenous Every 12 hours 12/01/20 0513 12/03/20 0746   12/01/20 0300  vancomycin (VANCOREADY) IVPB 2000 mg/400 mL        2,000 mg 200 mL/hr over 120 Minutes Intravenous  Once 12/01/20 0255 12/01/20 0528   11/30/20 2245  ceFEPIme (MAXIPIME) 2 g in sodium chloride 0.9 % 100 mL IVPB        2 g 200 mL/hr over 30 Minutes Intravenous STAT 11/30/20 2237 12/01/20 0003        Assessment/Plan Stage IIIb colon cancer S/p open partial colectomy with transverse colostomy 09/10/20 Dr. Bobbye Morton Abdominal pain - POD 112 - new CT from several days ago with no acute intra-abdominal findings to suggest any clear indication for his abdominal pain.   -he has no pain today.  He did indicate that his pain worsens with HD and discussed yesterday with primary team trying to run him on the dry side given he is net negative and can be having low flow during HD.  He also indicated to palliative care yesterday that some of his pain is "hunger pain."  Unfortunately, symptomatic treatment is the only option at this time.  No surgical interventions. -D/W primary service.  Vit A/C labs pending.   -we will sign off at this time.  Please call if needed further.      FEN:  NPO, Cortrak for TFs VTE: eliquis, SCDs ID: [email or web address] 7/31>>, vanc/eraxis 8/1>> abx complete   - below per primary team - Sepsis vs reaction to chemotherapy with shock - resolved Acute hypoxic respiratory failure/fungal PNA - on trach collar AKI on CKD stage II - on HD A fib with RVR - on amio gtt Hx of MCA CVA Hx of asthma Hx of epilepsy on Keppra T2DM GERD HTN HLD OSA Obesity class II - BMI 39.76  LOS: 30 days    Henreitta Cea, [facility] 12/31/2020, 11:12 AM Please see Amion for pager number during day hours 7:00am-4:30pm

## 2020-12-31 NOTE — Progress Notes (Signed)
Palliative:  HPI: 65 yo male with PMH significant for stage 3b colon cancer, L MCA stroke, frontal brain tumor, seizures, HTN, HLD, sleep apnea, aspiration pneumonia, diabetes admitted 11/30/2020 with abd pain, anorexia, and weakness. Hospitalization complicated by septic shock of uncertain origin (potentially likely colitis vs pneumonia) leading to need for intubation, vasopressors, CRRT. His condition remains tenuous in ICU and has previously failed extubation. Extubated today 12/18/20 but not doing well.    Thomas [name] is resting comfortably this morning. No family at bedside. No changes to goals of care and these have remained clear throughout hospitalization. Only limitation is if he is in a persistent vegetative state and he would not want life prolonged in this state. Otherwise they request full code, full scope. Discussed with Dr. Waldron Labs. I will be off service and then rotating to other hospital campuses over the next few weeks. Palliative colleagues to shadow chart and follow peripherally. Please call for acute palliative needs [phone].   Exam: Resting comfortably with tracheostomy. I did not awaken.   Plan:  - Full code, full scope.  - CLEAR GOALS: ONLY IN PERSISTENT VEGETATIVE STATE TO "LET HIM GO." Otherwise desire for continued full scope.  - Wife frustrated with constant discussion and feeling she is trying to be convinced to go against her husband's wishes. Please be aware of this situation and goals prior to initiation of further conversations. This remains unchanged.   15 min  Thomas Sill, NP Palliative Medicine Team Pager [phone] (Please see [email or web address] for schedule) Team Phone [phone]    Greater than 50%  of this time was spent counseling and coordinating care related to the above assessment and plan

## 2020-12-31 NOTE — Progress Notes (Signed)
PROGRESS NOTE        PATIENT DETAILS Name: Thomas [name] Age: 65 y.o. Sex: male Date of Birth: 1955/08/10 Admit Date: 11/30/2020 Admitting Physician Renee Pain, MD OYD:XAJOI, Rachel Moulds, NP  Brief Narrative:  Patient is a 65 y.o. male with history of colon cancer-s/p partial transverse colectomy/colostomy 09/10/2020-recently started on chemotherapy prior to this hospitalization-who presented to the hospital on 7/24 with abdominal pain, anorexia, weakness-he was thought to have sepsis syndrome-and subsequently started on antimicrobial therapy.  He was initially admitted to the intensive care unit-but stabilized and transferred to the [facility] on 7/27, unfortunately further hospital course was complicated by altered mental status/delirium-septic shock/acute hypoxic respiratory failure in the setting of aspiration pneumonia.  He was transferred to the ICU where he was intubated and did require pressor support.He had a prolonged stay in the ICU-complicated by development of AKI due to ATN requiring initiation of renal replacement therapy.  He eventually failed extubation and required tracheostomy placement.   He was transferred back to the [facility] on 8/17.   Significant events: 7/25>> SIRS-admit to ICU 7/27>> Transferred to [place], stopped abx as sepsis resolved/ruled out 7/30>> worsening hypoxia/new aspiration-worsening renal function-transfer to ICU-intubated  8/08>>failed extubation 8/8-reintubated 8/11>> tracheostomy placement 8/17>> transferred to [facility] 8/22>> pulled trach out x2-replaced by PCCM  Significant studies: 7/26>> Echo: EF 78-67%, grade 1 diastolic dysfunction 6/72>> CT head: Negative for bleed/acute intracranial process. 7/29>> EEG: No seizures. 8/01>> CT abdomen/pelvis: Inflammatory wall thickening/fat stranding around descending/sigmoid colon.  Airspace opacity in the right lower lobe/right middle lobe. 8/05>> bilateral  lower extremity Doppler: DVT right femoral vein, right proximal profunda vein, right popliteal vein 8/20>> CT abdomen/pelvis: Decreased bowel inflammation and fluid filling when compared to prior CT.  No SBO.  Antimicrobial therapy: Unasyn 7/30 >>7/31 Maxepime 7/31 >> 8/7 Flagyl 7/31 >>  8/7 Vancomycin 8/1 only  Anidulafungin 8/1>>8/3 Rocephin: 8/16>>8/17 Cefepime:8/17>>8/21  Microbiology data: 7/24>> blood cultures: Negative 7/25>> urine culture: Negative 7/26>> C. difficile studies: Negative 7/31>> respiratory culture: Moderate Candida tropicalis. 8/01>> GI pathogen panel: Negative 8/15>> tracheal aspirate: Pseudomonas aeruginosa   Procedures : 7/30>> 8/11: ETT 8/11>> tracheostomy placement 8/15>> left IJ TDC  Consults: PCCM, surgery, oncology, nephrology, palliative care  DVT Prophylaxis : apixaban (ELIQUIS) tablet 5 mg    Subjective:  This morning he is confused, Seen during dialysis-anxious-somewhat agitated.  When asked if he has abdominal pain-he shook his head now-and wanted to be placed in a chair so he could be sitting up.  Later I was informed by RN that he asked his dialysis to be cut off short  Assessment/Plan:  Septic shock due to aspiration pneumonia:  - Sepsis physiology has resolved-culture data as above-has completed a course of antimicrobial therapy.  Afebrile-stable hemodynamics but tenuous that he is still accumulating secretions-and remains at risk for aspiration pneumonia.  Continue to closely monitor  Acute hypoxic respiratory failure due to aspiration pneumonia:  - Prolonged ICU stay-failed extubation attempt-subsequently underwent tracheostomy on 8/11.  He continues to have secretions and requires frequent suctioning.  On 8/22 he pulled his trach out x2.  Currently on restraints -PCCM following for trach care.    Dysphagia:  - In a setting of acute illness-respiratory failure-tracheostomy-severe deconditioning-on NG tube feedings  currently-await further recommendations from SLP.  Remains NPO-do not think he is ready for a swallow evaluation-continues to have significant  respiratory secretions.   -As well continues to have difficult encephalopathy, no improvement likely will need PEG insertion .  Acute metabolic encephalopathy:  - Etiology felt to be due to acute illness/prolonged ICU stay/hypoxemia/sepsis-continues to have waxing and waning encephalopathy.  Somewhat anxious as well.   - Need to be cautious regarding oversedation which can worsen aspiration.  However given that he is agitated/anxious-unable to complete dialysis-we will increase Seroquel to 75 mg twice daily, increase dosage of Klonopin further.  Watch closely-and based on response titrate accordingly.  Palliative medicine assistance greatly appreciated.  AKI on CKD stage IIIb:  - AKI due to ischemic ATN-per RN-around 190 cc of urine overnight on bladder scan-he did not void any urine.  Apparently a couple nights ago-he required in and out catheterization-and 700 cc of urine was obtained.  Getting frequent bladder scans-if urinary retention reoccurs-plan is to place Foley catheter.  Tolerated dialysis for approximately an hour-had around 900 cc of output with dialysis.  Unfortunately-no signs yet of any renal recovery.  Electrolytes remain relatively stable.  Nephrology following.  Per nephrology-if patient remains debilitated-agitated-and is unable to sit up-he will not be a long term candidate for outpatient hemodialysis.  Continue to follow closely-watch for signs of renal recovery.    Right lower extremity DVT: Continue Eliquis.  PAF with RVR: Provoked by acute illness/sepsis-maintaining sinus rhythm.  Echo with stable EF.  On Eliquis for DVT.  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 Benay Spice. Previous MD Spoke with  oncologist-Dr. Benay Spice on 8/23-no plans to resume chemotherapy-as he has no active cancer.  Per Dr. Evon Slack has had issues with high output ostomy in the past as well.  Colitis:  - Seen initially on CT abdomen on 8/1-was evaluated by general surgery-with recommendations to continue supportive care.  Since he continued to have abdominal pain-repeat CT was obtained on 8/ 20 which actually showed improvement in his colitis with no evidence of SBO.   -Patient continues to have abdominal pain as of 8/23, general surgery consult greatly appreciated, no acute concerning issues, this is possibly  related to hunger pain .  Anemia of critical illness: Hemoglobin continues to fluctuate but relatively stable.  No overt blood loss.  Continue periodic CBC monitoring.     Hyponatremia: Mild-will need to be managed with volume removal during HD.  Seizure disorder: Continue Keppra-EEG negative.  History of frontal meningioma-s/p resection 09/01/2016  HLD: Continue statin  DM-2 (A1c 8.7 on 7/25): CBG stable-continue 18 units of Levemir twice daily, 6 units of NovoLog every 4 hours and SSI.  Follow and adjust  Recent Labs    12/30/20 1649 12/30/20 2138 12/31/20 0837  GLUCAP 71 138* 129*    History of diabetic neuropathy: Resume Neurontin when able.  GERD: Continue PPI  Capecitabine induced desquamation/Hand/foot syndrome:Continue moisturizer to hands and feet and scrotum  Severe debility/deconditioning/dysphagia: Due to acute illness-continue PT/OT/SLP follow-up-likely will require SNF on discharge.   Nutrition Status: Nutrition Problem: Moderate Malnutrition Etiology: chronic illness, cancer and cancer related treatments Signs/Symptoms: mild fat depletion, mild muscle depletion, percent weight loss Percent weight loss: 11 % Interventions: Tube feeding, Prostat  Pressure injury: Pressure Injury 12/01/20 Coccyx Mid Stage 2 -  Partial thickness loss of dermis presenting as a shallow  open injury with a red, pink wound bed without slough. stage 2 decubitus to coxxyx (Active)  12/01/20 0900  Location: Coccyx  Location Orientation: Mid  Staging:  Stage 2 -  Partial thickness loss of dermis presenting as a shallow open injury with a red, pink wound bed without slough.  Wound Description (Comments): stage 2 decubitus to coxxyx  Present on Admission: Yes     Pressure Injury 12/14/20 Face Upper;Mid Deep Tissue Pressure Injury - Purple or maroon localized area of discolored intact skin or blood-filled blister due to damage of underlying soft tissue from pressure and/or shear. circular shape broken area & sloug (Active)  12/14/20 0100  Location: Face  Location Orientation: Upper;Mid  Staging: Deep Tissue Pressure Injury - Purple or maroon localized area of discolored intact skin or blood-filled blister due to damage of underlying soft tissue from pressure and/or shear.  Wound Description (Comments): circular shape broken area & sloughing skin  Present on Admission: No     Pressure Injury 12/14/20 Face Left;Upper Deep Tissue Pressure Injury - Purple or maroon localized area of discolored intact skin or blood-filled blister due to damage of underlying soft tissue from pressure and/or shear. circular shape broken area & slou (Active)  12/14/20 0100  Location: Face  Location Orientation: Left;Upper  Staging: Deep Tissue Pressure Injury - Purple or maroon localized area of discolored intact skin or blood-filled blister due to damage of underlying soft tissue from pressure and/or shear.  Wound Description (Comments): circular shape broken area & sloughing skin  Present on Admission: No     Pressure Injury 12/23/20 Buttocks Left Stage 2 -  Partial thickness loss of dermis presenting as a shallow open injury with a red, pink wound bed without slough. left buttock stage 2 (Active)  12/23/20   Location: Buttocks  Location Orientation: Left  Staging: Stage 2 -  Partial thickness loss of  dermis presenting as a shallow open injury with a red, pink wound bed without slough.  Wound Description (Comments): left buttock stage 2  Present on Admission: Yes   Obesity: Estimated body mass index is 36 kg/m as calculated from the following:   Height as of this encounter: _0  (1.727 m).   Weight as of this encounter: 107.4 kg.     Diet: Diet Order             Diet NPO time specified  Diet effective midnight                    Code Status: Full code   Family Communication: None at bedside   Status is: Inpatient  Remains inpatient appropriate because:Inpatient level of care appropriate due to severity of illness  Dispo: The patient is from: Home              Anticipated d/c is to: SNF              Patient currently is not medically stable to d/c.   Difficult to place patient No   Barriers to Discharge: N.p.o.-NG tube in place-AKI/ATN requiring HD  Antimicrobial agents: Anti-infectives (From admission, onward)    Start     Dose/Rate Route Frequency Ordered Stop   12/24/20 1730  ceFEPIme (MAXIPIME) 1 g in sodium chloride 0.9 % 100 mL IVPB        1 g 200 mL/hr over 30 Minutes Intravenous Every 24 hours 12/24/20 1642 12/29/20 0901   12/23/20 0845  cefTRIAXone (ROCEPHIN) 2 g in sodium chloride 0.9 % 100 mL IVPB  Status:  Discontinued        2 g 200 mL/hr over 30 Minutes Intravenous Every 24 hours 12/23/20 0800 12/24/20 1620  12/22/20 0600  ceFAZolin (ANCEF) IVPB 2g/100 mL premix        2 g 200 mL/hr over 30 Minutes Intravenous To Radiology 12/21/20 1503 12/22/20 1149   12/09/20 1800  vancomycin (VANCOREADY) IVPB 1750 mg/350 mL  Status:  Discontinued        1,750 mg 175 mL/hr over 120 Minutes Intravenous Every 24 hours 12/08/20 1357 12/09/20 1403   12/09/20 1700  anidulafungin (ERAXIS) 100 mg in sodium chloride 0.9 % 100 mL IVPB  Status:  Discontinued        100 mg 78 mL/hr over 100 Minutes Intravenous Every 24 hours 12/08/20 1359 12/09/20 1321    12/08/20 1500  vancomycin (VANCOCIN) 2,500 mg in sodium chloride 0.9 % 500 mL IVPB        2,500 mg 250 mL/hr over 120 Minutes Intravenous  Once 12/08/20 1357 12/08/20 1719   12/08/20 1500  anidulafungin (ERAXIS) 200 mg in sodium chloride 0.9 % 200 mL IVPB        200 mg 78 mL/hr over 200 Minutes Intravenous  Once 12/08/20 1359 12/08/20 1821   12/08/20 0000  ceFEPIme (MAXIPIME) 2 g in sodium chloride 0.9 % 100 mL IVPB  Status:  Discontinued        2 g 200 mL/hr over 30 Minutes Intravenous Every 12 hours 12/07/20 1200 12/14/20 0858   12/07/20 1000  metroNIDAZOLE (FLAGYL) IVPB 500 mg  Status:  Discontinued        500 mg 100 mL/hr over 60 Minutes Intravenous Every 12 hours 12/07/20 0849 12/14/20 0858   12/07/20 0900  ceFEPIme (MAXIPIME) 2 g in sodium chloride 0.9 % 100 mL IVPB  Status:  Discontinued        2 g 200 mL/hr over 30 Minutes Intravenous Every 24 hours 12/07/20 0809 12/07/20 1200   12/07/20 0830  metroNIDAZOLE (FLAGYL) IVPB 500 mg  Status:  Discontinued        500 mg 100 mL/hr over 60 Minutes Intravenous Every 8 hours 12/07/20 0758 12/07/20 0849   12/06/20 0900  Ampicillin-Sulbactam (UNASYN) 3 g in sodium chloride 0.9 % 100 mL IVPB  Status:  Discontinued        3 g 200 mL/hr over 30 Minutes Intravenous Every 12 hours 12/06/20 0832 12/07/20 0757   12/03/20 0800  ceFEPIme (MAXIPIME) 2 g in sodium chloride 0.9 % 100 mL IVPB  Status:  Discontinued        2 g 200 mL/hr over 30 Minutes Intravenous Every 8 hours 12/03/20 0746 12/04/20 1004   12/01/20 1000  ceFEPIme (MAXIPIME) 2 g in sodium chloride 0.9 % 100 mL IVPB  Status:  Discontinued        2 g 200 mL/hr over 30 Minutes Intravenous Every 12 hours 12/01/20 0513 12/03/20 0746   12/01/20 0300  vancomycin (VANCOREADY) IVPB 2000 mg/400 mL        2,000 mg 200 mL/hr over 120 Minutes Intravenous  Once 12/01/20 0255 12/01/20 0528   11/30/20 2245  ceFEPIme (MAXIPIME) 2 g in sodium chloride 0.9 % 100 mL IVPB        2 g 200 mL/hr over 30  Minutes Intravenous STAT 11/30/20 2237 12/01/20 0003        Time spent: 35 minutes-Greater than 50% of this time was spent in counseling, explanation of diagnosis, planning of further management, and coordination of care.  MEDICATIONS: Scheduled Meds:  acetaminophen  1,000 mg Per Tube Q8H   apixaban  5 mg Per Tube BID   vitamin C  500 mg Oral TID   atorvastatin  20 mg Per Tube Daily   chlorhexidine gluconate (MEDLINE KIT)  15 mL Mouth Rinse BID   Chlorhexidine Gluconate Cloth  6 each Topical Q1200   Chlorhexidine Gluconate Cloth  6 each Topical Q0600   clonazePAM (KLONOPIN) disintegrating tablet 1.5 mg  1.5 mg Per Tube Q6H   feeding supplement (PROSource TF)  45 mL Per Tube BID   fentaNYL  1 patch Transdermal Q72H   fiber  1 packet Per Tube BID   free water  100 mL Per Tube Q4H   glycopyrrolate  1 mg Per Tube BID   hydrocerin   Topical BID   insulin aspart  0-15 Units Subcutaneous Q4H   insulin aspart  6 Units Subcutaneous Q4H   insulin detemir  18 Units Subcutaneous BID   levETIRAcetam  500 mg Per Tube BID   mouth rinse  15 mL Mouth Rinse QID   multivitamin with minerals  1 tablet Oral Daily   pantoprazole sodium  40 mg Per Tube BID   QUEtiapine  75 mg Per Tube BID   thiamine  100 mg Per Tube Daily   Continuous Infusions:  sodium chloride Stopped (12/20/20 0955)   feeding supplement (NEPRO CARB STEADY)     feeding supplement (OSMOLITE 1.5 CAL) 1,000 mL (12/31/20 0603)   PRN Meds:.sodium chloride, albuterol, clonazePAM, dextrose, docusate, fentaNYL (SUBLIMAZE) injection, heparin sodium (porcine), loperamide, sodium chloride flush   PHYSICAL EXAM: Vital signs: Vitals:   12/31/20 0500 12/31/20 0833 12/31/20 0901 12/31/20 1140  BP:  (!) 143/77  (!) 143/77  Pulse:  90 91   Resp:  18 16   Temp:  98.3 F (36.8 C)    TempSrc:  Skin    SpO2:  100%    Weight: 107.4 kg     Height:       Filed Weights   12/30/20 0851 12/30/20 1028 12/31/20 0500  Weight: 108.7 kg 107.6  kg 107.4 kg   Body mass index is 36 kg/m.   Is more somnolent today, but open eyes to verbal stimuli, does not follow any commands or answer any questions appropriately  Symmetrical Chest wall movement, Good air movement bilaterally, CTAB ant RRR,No Gallops,Rubs or new Murmurs, No Parasternal Heave +ve B.Sounds, Abd Soft, No tenderness, No rebound - guarding or rigidity. No Cyanosis, Clubbing or edema, No new Rash or bruise     I have personally reviewed following labs and imaging studies  LABORATORY DATA: CBC: Recent Labs  Lab 12/26/20 0420 12/27/20 0444 12/28/20 0334 12/29/20 0341 12/31/20 0431  WBC 9.8 12.0* 11.8* 13.0* 12.4*  HGB 8.0* 8.6* 8.3* 8.1* 7.2*  HCT 24.9* 25.0* 25.5* 24.2* 21.9*  MCV 89.9 86.8 88.9 88.3 89.4  PLT 170 172 182 180 470    Basic Metabolic Panel: Recent Labs  Lab 12/27/20 0444 12/28/20 0334 12/29/20 0341 12/30/20 0410 12/31/20 0431  NA 130* 129* 131* 131* 131*  K 4.7 3.8 4.2 4.4 4.3  CL 93* 93* 96* 96* 96*  CO2 21* _0 GLUCOSE 112* 144* 139* 134* 173*  BUN 50* 26* 44* 58* 46*  CREATININE 5.12* 3.40* 4.44* 5.33* 4.44*  CALCIUM 8.3* 8.2* 8.4* 8.2* 8.2*  PHOS 5.2* 5.2* 6.0* 7.1* 6.2*    GFR: Estimated Creatinine Clearance: 20 mL/min (A) (by C-G formula based on SCr of 4.44 mg/dL (H)).  Liver Function Tests: Recent Labs  Lab 12/27/20 0444 12/28/20 0334 12/29/20 0341 12/30/20 0410 12/31/20 0431  ALBUMIN 1.8*  1.9* 1.7* 1.7* 1.7*   No results for input(s): LIPASE, AMYLASE in the last 168 hours. No results for input(s): AMMONIA in the last 168 hours.  Coagulation Profile: No results for input(s): INR, PROTIME in the last 168 hours.  Cardiac Enzymes: No results for input(s): CKTOTAL, CKMB, CKMBINDEX, TROPONINI in the last 168 hours.  BNP (last 3 results) No results for input(s): PROBNP in the last 8760 hours.  Lipid Profile: No results for input(s): CHOL, HDL, LDLCALC, TRIG, CHOLHDL, LDLDIRECT in the last 72  hours.  Thyroid Function Tests: No results for input(s): TSH, T4TOTAL, FREET4, T3FREE, THYROIDAB in the last 72 hours.  Anemia Panel: No results for input(s): VITAMINB12, FOLATE, FERRITIN, TIBC, IRON, RETICCTPCT in the last 72 hours.  Urine analysis:    Component Value Date/Time   COLORURINE YELLOW 12/01/2020 0200   APPEARANCEUR CLEAR 12/01/2020 0200   LABSPEC 1.023 12/01/2020 0200   PHURINE 5.0 12/01/2020 0200   GLUCOSEU >=500 (A) 12/01/2020 0200   HGBUR NEGATIVE 12/01/2020 0200   BILIRUBINUR NEGATIVE 12/01/2020 0200   KETONESUR NEGATIVE 12/01/2020 0200   PROTEINUR 100 (A) 12/01/2020 0200   UROBILINOGEN 0.2 09/27/2014 0301   NITRITE NEGATIVE 12/01/2020 0200   LEUKOCYTESUR NEGATIVE 12/01/2020 0200    Sepsis Labs: Lactic Acid, Venous    Component Value Date/Time   LATICACIDVEN 2.1 ([place]) 12/10/2020 0412    MICROBIOLOGY: Recent Results (from the past 240 hour(s))  Culture, Respiratory w Gram Stain     Status: None   Collection Time: 12/22/20 10:13 AM   Specimen: Tracheal Aspirate; Respiratory  Result Value Ref Range Status   Specimen Description TRACHEAL ASPIRATE  Final   Special Requests NONE  Final   Gram Stain   Final    FEW SQUAMOUS EPITHELIAL CELLS PRESENT FEW WBC SEEN ABUNDANT GRAM POSITIVE COCCI FEW GRAM VARIABLE ROD Performed at [facility], [place] [address] [place], [place] [address]    Culture RARE PSEUDOMONAS AERUGINOSA  Final   Report Status 12/26/2020 FINAL  Final   Organism ID, Bacteria PSEUDOMONAS AERUGINOSA  Final      Susceptibility   Pseudomonas aeruginosa - MIC*    CEFTAZIDIME <=1 SENSITIVE Sensitive     GENTAMICIN <=1 SENSITIVE Sensitive     PIP/TAZO 8 SENSITIVE Sensitive     * RARE PSEUDOMONAS AERUGINOSA    RADIOLOGY STUDIES/RESULTS: No results found.   LOS: 62 days   Phillips Climes, MD  [facility]    To contact the attending provider between 7A-7P or the covering provider during after hours 7P-7A, please log into  the web site [email or web address] and access using universal [place] password for that web site. If you do not have the password, please call the hospital operator.  12/31/2020, 12:17 PM

## 2020-12-31 NOTE — Progress Notes (Signed)
[facility] NEPHROLOGY PROGRESS NOTE  Assessment/ Plan:  # Dialysis dependent AKI/CKD stage IIIb - ischemic ATN in setting of severe sepsis and multisystem organ failure.  Admission creatinine was 2.1 and earlier on CRRT (stopped after 8/9) and transitioned to IHD-underwent left IJ TDC placement on 8/15.  Remains anuric.   On THS here.  He is not able to sit on chair or recliner and is  very debilitated.  Noted palliative care team has seen patient during this hospitalization. Family desires to continue current full scope of treatment.  Cont HD on THS Schedule: 2K, TDC, no heparin, 3.5 hours, 2 to 3 L UF    # Altered mental status-with prior history of CVA in 2015 and seizure disorder,.  Per TRH  # Volume overload: cont UF as able  # Sepsis/aspiration pneumonia -hemodynamic status improved/off pressors and has completed the course of  IV cefepime and vancomycin. Per TRH  # Chronic hypoxic respiratory failure -status post tracheostomy and ongoing supplementation via T-bar. Per TRH / CCM  # Stage IIIB colon cancer - sp partial colectomy, colostomy. On chemoRx sp 3 cycles.  # Right leg DVT - anticoagulation per primary team.  # Atrial fibrillation - on Eliquis, rate controlled.  # Anemia of critical illness: Continue to monitor hemoglobin/hematocrit trend-holding erythropoietin with active malignancy.  #CKD-BMD: Intermittent hyperphosphatemia, trend.  Calcium stable.   Subjective:  TRH and palliative notes from yesterday reviewed HD terminated early yesterday at patient request and secondary to agitation Patient currently resting 0.4 L urine output yesterday, intermittent  Objective Vital signs in last 24 hours: Vitals:   12/31/20 0833 12/31/20 0901 12/31/20 1140 12/31/20 1224  BP: (!) 143/77  (!) 143/77 131/79  Pulse: 90 91  91  Resp: 18 16  18   Temp: 98.3 F (36.8 C)   97.6 F (36.4 C)  TempSrc: Skin   Oral  SpO2: 100%   100%  Weight:      Height:        Weight change: 0 kg  Intake/Output Summary (Last 24 hours) at 12/31/2020 1257 Last data filed at 12/31/2020 1100 Gross per 24 hour  Intake 1020 ml  Output 925 ml  Net 95 ml        Labs: Basic Metabolic Panel: Recent Labs  Lab 12/29/20 0341 12/30/20 0410 12/31/20 0431  NA 131* 131* 131*  K 4.2 4.4 4.3  CL 96* 96* 96*  CO2 22 22 23   GLUCOSE 139* 134* 173*  BUN 44* 58* 46*  CREATININE 4.44* 5.33* 4.44*  CALCIUM 8.4* 8.2* 8.2*  PHOS 6.0* 7.1* 6.2*    Liver Function Tests: Recent Labs  Lab 12/29/20 0341 12/30/20 0410 12/31/20 0431  ALBUMIN 1.7* 1.7* 1.7*    No results for input(s): LIPASE, AMYLASE in the last 168 hours. No results for input(s): AMMONIA in the last 168 hours. CBC: Recent Labs  Lab 12/26/20 0420 12/27/20 0444 12/28/20 0334 12/29/20 0341 12/31/20 0431  WBC 9.8 12.0* 11.8* 13.0* 12.4*  HGB 8.0* 8.6* 8.3* 8.1* 7.2*  HCT 24.9* 25.0* 25.5* 24.2* 21.9*  MCV 89.9 86.8 88.9 88.3 89.4  PLT 170 172 182 180 208    Cardiac Enzymes: No results for input(s): CKTOTAL, CKMB, CKMBINDEX, TROPONINI in the last 168 hours. CBG: Recent Labs  Lab 12/30/20 1228 12/30/20 1649 12/30/20 2138 12/31/20 0837 12/31/20 1228  GLUCAP 107* 71 138* 129* 127*     Iron Studies: No results for input(s): IRON, TIBC, TRANSFERRIN, FERRITIN in the last 72 hours. Studies/Results:  No results found.  Medications: Infusions:  sodium chloride Stopped (12/20/20 0955)   feeding supplement (NEPRO CARB STEADY)     feeding supplement (OSMOLITE 1.5 CAL) 1,000 mL (12/31/20 0603)    Scheduled Medications:  acetaminophen  1,000 mg Per Tube Q8H   apixaban  5 mg Per Tube BID   vitamin C  500 mg Oral TID   atorvastatin  20 mg Per Tube Daily   chlorhexidine gluconate (MEDLINE KIT)  15 mL Mouth Rinse BID   Chlorhexidine Gluconate Cloth  6 each Topical Q1200   Chlorhexidine Gluconate Cloth  6 each Topical Q0600   clonazePAM (KLONOPIN) disintegrating tablet 1.5 mg  1.5 mg Per  Tube Q6H   feeding supplement (PROSource TF)  45 mL Per Tube BID   fentaNYL  1 patch Transdermal Q72H   fiber  1 packet Per Tube BID   free water  100 mL Per Tube Q4H   glycopyrrolate  1 mg Per Tube BID   hydrocerin   Topical BID   insulin aspart  0-15 Units Subcutaneous Q4H   insulin aspart  6 Units Subcutaneous Q4H   insulin detemir  18 Units Subcutaneous BID   levETIRAcetam  500 mg Per Tube BID   mouth rinse  15 mL Mouth Rinse QID   multivitamin with minerals  1 tablet Oral Daily   pantoprazole sodium  40 mg Per Tube BID   QUEtiapine  75 mg Per Tube BID   thiamine  100 mg Per Tube Daily    have reviewed scheduled and prn medications.  Physical Exam: General: Critically ill looking male on tracheal collar, resting Heart:RRR, s1s2 nl Lungs: Coarse breath sounds anteriorly Abdomen:soft, Non-tender, non-distended Extremities: Trace b/l LE edema  with chronic venous stasis change. Dialysis Access: [facility] in place.   Rommie Dunn B Lache Dagher 12/31/2020,12:57 PM  LOS: 30 days

## 2020-12-31 NOTE — Progress Notes (Signed)
Nutrition Follow-up  DOCUMENTATION CODES:   Non-severe (moderate) malnutrition in context of chronic illness, Obesity unspecified  INTERVENTION:   -D/c Osmolite 1.5  -Initiate Nepro @ 55 ml/hr via cortrak tube  45 ml Prosource Plus BID  100 ml free water flush every 4 hours  Tube feeding regimen provides 2456 kcals (100% of needs), 129 grams protein, and 950 ml free water. Total free water: 1550 ml daily  -Renal MVI daily  -Continue Nutrisource fiber BID  NUTRITION DIAGNOSIS:   Moderate Malnutrition related to chronic illness, cancer and cancer related treatments as evidenced by mild fat depletion, mild muscle depletion, percent weight loss.  Ongoing  GOAL:   Patient will meet greater than or equal to 90% of their needs  Progressing   MONITOR:   Vent status, TF tolerance, Labs, Weight trends, Skin, I & O's  REASON FOR ASSESSMENT:   Consult Enteral/tube feeding initiation and management  ASSESSMENT:   65 y.o. male who is a former with medical history of stage 3 colon cancer s/p partial colectomy and end colostomy on 5/4, recent chemo start with 3rd cycle on 7/22, sleep apnea, seizures, epilepsy, brain tumor, HTN, HLD, GERD, asthma, peripheral neuropathy, urinary frequency, DM, MCA stroke. He presented to the ED via EMS due to abdominal pain x2 weeks, anorexia, and weakness. Admitted for close monitoring for concern of mesenteric ischemia. Patient reported that abdominal pain was worse with eating PTA.  7/31 CRRT initiated 8/9 CRRT stopped  8/10 tx to [facility] for trach placement and iHD 8/11 s/p trach 8/12- cortrak placed (tip of tube in stomach) 8/15- s/p Placement of a LEFT IJ 19 cm Palindrome tunneled HD catheter.  Catheter tip at caroatrial junction; transitioned to trach collar 8/22- s/p bronch- revealed mild area of granulation tissue of top of trach tube   Reviewed I/O's: -988 ml x 24 hours and -7 L since 12/17/20  UOP: 400 ml x 24 hours  Colostomy output:  675 ml x 24 hours  Pt very lethargic at time of visit, but will open eyes when name is called.   Case discussed with RN, who reports that pt continues to tolerate TF well. Per chart review, pt complains of hunger. SLP following for PSMV trials, however, still not able to take PO intake yet.   General surgery has ordered labs for vitamin C and vitamin A due to suspect micronutrient deficiencies.   Medications reviewed and include vitamin C and thiamine.   Labs reviewed: Na: 131, Phos: 6.2, CBGS: 71-138 (inpatient orders for glycemic control are 0-15 units insulin aspart every 4 hours, 6 units inuslin aspart every 4 hours, and 18 units inuslin detemir BID).    Diet Order:   Diet Order             Diet NPO time specified  Diet effective midnight                   EDUCATION NEEDS:   Not appropriate for education at this time  Skin:  Skin Assessment: Skin Integrity Issues: Skin Integrity Issues:: Stage II, DTI, Other (Comment) DTI: upper R + L face (newly documented 8/7) Stage II: mid- coccyx; R back (newly documented 8/7) Other: sloughing to L hand d/t hand-foot-and-mouth disease, rt thigh non-pressure friction wound  Last BM:  12/31/20 (400 ml via colostomy)  Height:   Ht Readings from Last 1 Encounters:  12/01/20 5\' 8"  (1.727 m)    Weight:   Wt Readings from Last 1 Encounters:  12/31/20 107.4  kg    Ideal Body Weight:  70 kg  BMI:  Body mass index is 36 kg/m.  Estimated Nutritional Needs:   Kcal:  6606-3016  Protein:  120-140 gm  Fluid:  1 L + UOP    Thomas [name] W, RD, LDN, CDCES Registered Dietitian II Certified Diabetes Care and Education Specialist Please refer to [facility] for RD and/or RD on-call/weekend/after hours pager

## 2021-01-01 ENCOUNTER — Inpatient Hospital Stay (HOSPITAL_COMMUNITY): Payer: Medicare HMO

## 2021-01-01 DIAGNOSIS — Z9911 Dependence on respirator [ventilator] status: Secondary | ICD-10-CM | POA: Diagnosis not present

## 2021-01-01 DIAGNOSIS — J9601 Acute respiratory failure with hypoxia: Secondary | ICD-10-CM | POA: Diagnosis not present

## 2021-01-01 DIAGNOSIS — E1100 Type 2 diabetes mellitus with hyperosmolarity without nonketotic hyperglycemic-hyperosmolar coma (NKHHC): Secondary | ICD-10-CM | POA: Diagnosis not present

## 2021-01-01 DIAGNOSIS — Z930 Tracheostomy status: Secondary | ICD-10-CM | POA: Diagnosis not present

## 2021-01-01 LAB — POCT I-STAT 7, (LYTES, BLD GAS, ICA,H+H)
Acid-base deficit: 4 mmol/L — ABNORMAL HIGH (ref 0.0–2.0)
Bicarbonate: 20.2 mmol/L (ref 20.0–28.0)
Calcium, Ion: 1.08 mmol/L — ABNORMAL LOW (ref 1.15–1.40)
HCT: 22 % — ABNORMAL LOW (ref 39.0–52.0)
Hemoglobin: 7.5 g/dL — ABNORMAL LOW (ref 13.0–17.0)
O2 Saturation: 99 %
Potassium: 4.8 mmol/L (ref 3.5–5.1)
Sodium: 130 mmol/L — ABNORMAL LOW (ref 135–145)
TCO2: 21 mmol/L — ABNORMAL LOW (ref 22–32)
pCO2 arterial: 32 mmHg (ref 32.0–48.0)
pH, Arterial: 7.409 (ref 7.350–7.450)
pO2, Arterial: 153 mmHg — ABNORMAL HIGH (ref 83.0–108.0)

## 2021-01-01 LAB — CBC
HCT: 21.8 % — ABNORMAL LOW (ref 39.0–52.0)
Hemoglobin: 7.4 g/dL — ABNORMAL LOW (ref 13.0–17.0)
MCH: 30.3 pg (ref 26.0–34.0)
MCHC: 33.9 g/dL (ref 30.0–36.0)
MCV: 89.3 fL (ref 80.0–100.0)
Platelets: 248 10*3/uL (ref 150–400)
RBC: 2.44 MIL/uL — ABNORMAL LOW (ref 4.22–5.81)
RDW: 17 % — ABNORMAL HIGH (ref 11.5–15.5)
WBC: 13.8 10*3/uL — ABNORMAL HIGH (ref 4.0–10.5)
nRBC: 0 % (ref 0.0–0.2)

## 2021-01-01 LAB — RENAL FUNCTION PANEL
Albumin: 1.7 g/dL — ABNORMAL LOW (ref 3.5–5.0)
Anion gap: 14 (ref 5–15)
BUN: 59 mg/dL — ABNORMAL HIGH (ref 8–23)
CO2: 21 mmol/L — ABNORMAL LOW (ref 22–32)
Calcium: 8.5 mg/dL — ABNORMAL LOW (ref 8.9–10.3)
Chloride: 94 mmol/L — ABNORMAL LOW (ref 98–111)
Creatinine, Ser: 5.22 mg/dL — ABNORMAL HIGH (ref 0.61–1.24)
GFR, Estimated: 12 mL/min — ABNORMAL LOW (ref >60)
Glucose, Bld: 110 mg/dL — ABNORMAL HIGH (ref 70–99)
Phosphorus: 7.4 mg/dL — ABNORMAL HIGH (ref 2.5–4.6)
Potassium: 4.9 mmol/L (ref 3.5–5.1)
Sodium: 129 mmol/L — ABNORMAL LOW (ref 135–145)

## 2021-01-01 LAB — GLUCOSE, CAPILLARY
Glucose-Capillary: 109 mg/dL — ABNORMAL HIGH (ref 70–99)
Glucose-Capillary: 118 mg/dL — ABNORMAL HIGH (ref 70–99)
Glucose-Capillary: 126 mg/dL — ABNORMAL HIGH (ref 70–99)
Glucose-Capillary: 163 mg/dL — ABNORMAL HIGH (ref 70–99)
Glucose-Capillary: 49 mg/dL — ABNORMAL LOW (ref 70–99)
Glucose-Capillary: 71 mg/dL (ref 70–99)
Glucose-Capillary: 98 mg/dL (ref 70–99)

## 2021-01-01 LAB — VITAMIN A: Vitamin A (Retinoic Acid): 70.2 ug/dL — ABNORMAL HIGH (ref 22.0–69.5)

## 2021-01-01 MED ORDER — FENTANYL BOLUS VIA INFUSION
50.0000 ug | INTRAVENOUS | Status: DC | PRN
  Administered 2021-01-01: 100 ug via INTRAVENOUS
  Administered 2021-01-01 (×2): 50 ug via INTRAVENOUS
  Administered 2021-01-01 (×3): 100 ug via INTRAVENOUS
  Administered 2021-01-02: 75 ug via INTRAVENOUS
  Administered 2021-01-02: 50 ug via INTRAVENOUS
  Administered 2021-01-02 – 2021-01-05 (×12): 100 ug via INTRAVENOUS
  Administered 2021-01-06 (×2): 50 ug via INTRAVENOUS
  Administered 2021-01-06: 100 ug via INTRAVENOUS
  Filled 2021-01-01: qty 100

## 2021-01-01 MED ORDER — MIDAZOLAM HCL 2 MG/2ML IJ SOLN
2.0000 mg | INTRAMUSCULAR | Status: DC | PRN
Start: 2021-01-01 — End: 2021-01-08
  Administered 2021-01-02 – 2021-01-07 (×7): 2 mg via INTRAVENOUS
  Filled 2021-01-01 (×7): qty 2

## 2021-01-01 MED ORDER — MIDAZOLAM HCL 2 MG/2ML IJ SOLN
2.0000 mg | INTRAMUSCULAR | Status: DC | PRN
Start: 2021-01-01 — End: 2021-01-02
  Administered 2021-01-02: 2 mg via INTRAVENOUS
  Filled 2021-01-01: qty 2

## 2021-01-01 MED ORDER — FENTANYL CITRATE (PF) 100 MCG/2ML IJ SOLN
50.0000 ug | Freq: Once | INTRAMUSCULAR | Status: AC
  Administered 2021-01-01: 50 ug via INTRAVENOUS
  Filled 2021-01-01: qty 2

## 2021-01-01 MED ORDER — HEPARIN (PORCINE) 25000 UT/250ML-% IV SOLN
1250.0000 [IU]/h | INTRAVENOUS | Status: AC
  Administered 2021-01-01 – 2021-01-02 (×2): 1250 [IU]/h via INTRAVENOUS
  Administered 2021-01-03: 1200 [IU]/h via INTRAVENOUS
  Administered 2021-01-04: 1250 [IU]/h via INTRAVENOUS
  Filled 2021-01-01 (×4): qty 250

## 2021-01-01 MED ORDER — DOCUSATE SODIUM 50 MG/5ML PO LIQD
100.0000 mg | Freq: Two times a day (BID) | ORAL | Status: DC
  Administered 2021-01-01: 100 mg
  Filled 2021-01-01: qty 10

## 2021-01-01 MED ORDER — MIDAZOLAM HCL 2 MG/2ML IJ SOLN
1.0000 mg | Freq: Once | INTRAMUSCULAR | Status: AC
  Administered 2021-01-01: 1 mg via INTRAVENOUS

## 2021-01-01 MED ORDER — ETOMIDATE 2 MG/ML IV SOLN
20.0000 mg | Freq: Once | INTRAVENOUS | Status: AC
  Administered 2021-01-01: 20 mg via INTRAVENOUS

## 2021-01-01 MED ORDER — FENTANYL 2500MCG IN NS 250ML (10MCG/ML) PREMIX INFUSION
50.0000 ug/h | INTRAVENOUS | Status: DC
  Administered 2021-01-01: 50 ug/h via INTRAVENOUS
  Administered 2021-01-02: 200 ug/h via INTRAVENOUS
  Administered 2021-01-02: 125 ug/h via INTRAVENOUS
  Administered 2021-01-03 – 2021-01-04 (×4): 200 ug/h via INTRAVENOUS
  Administered 2021-01-05: 100 ug/h via INTRAVENOUS
  Administered 2021-01-05: 200 ug/h via INTRAVENOUS
  Filled 2021-01-01 (×9): qty 250

## 2021-01-01 MED ORDER — CLONAZEPAM 0.1 MG/ML ORAL SUSPENSION: 1.0000 mg | Freq: Four times a day (QID) | ORAL | Status: DC

## 2021-01-01 MED ORDER — CLONAZEPAM 0.5 MG PO TBDP
1.0000 mg | ORAL_TABLET | Freq: Four times a day (QID) | ORAL | Status: DC
  Administered 2021-01-01 – 2021-01-07 (×22): 1 mg
  Filled 2021-01-01 (×22): qty 2

## 2021-01-01 MED ORDER — PHENYLEPHRINE 40 MCG/ML (10ML) SYRINGE FOR IV PUSH (FOR BLOOD PRESSURE SUPPORT)
PREFILLED_SYRINGE | INTRAVENOUS | Status: AC
  Filled 2021-01-01: qty 10

## 2021-01-01 MED ORDER — ORAL CARE MOUTH RINSE
15.0000 mL | OROMUCOSAL | Status: DC
Start: 2021-01-02 — End: 2021-02-14
  Administered 2021-01-02 – 2021-02-14 (×352): 15 mL via OROMUCOSAL

## 2021-01-01 MED ORDER — INSULIN ASPART 100 UNIT/ML IJ SOLN
4.0000 [IU] | INTRAMUSCULAR | Status: DC
  Administered 2021-01-01 – 2021-01-08 (×27): 4 [IU] via SUBCUTANEOUS

## 2021-01-01 MED ORDER — QUETIAPINE FUMARATE 100 MG PO TABS
100.0000 mg | ORAL_TABLET | Freq: Two times a day (BID) | ORAL | Status: DC
  Administered 2021-01-01 – 2021-01-08 (×15): 100 mg
  Filled 2021-01-01 (×15): qty 1

## 2021-01-01 MED ORDER — HEPARIN SODIUM (PORCINE) 1000 UNIT/ML IJ SOLN
INTRAMUSCULAR | Status: AC
  Filled 2021-01-01: qty 8

## 2021-01-01 MED ORDER — POLYETHYLENE GLYCOL 3350 17 G PO PACK: 17.0000 g | PACK | Freq: Every day | ORAL | Status: DC

## 2021-01-01 NOTE — Progress Notes (Signed)
PROGRESS NOTE        PATIENT DETAILS Name: Thomas [name] Age: 65 y.o. Sex: male Date of Birth: 1956/03/10 Admit Date: 11/30/2020 Admitting Physician Renee Pain, MD [id number], Rachel Moulds, NP  Brief Narrative:  Patient is a 65 y.o. male with history of colon cancer-s/p partial transverse colectomy/colostomy 09/10/2020-recently started on chemotherapy prior to this hospitalization-who presented to the hospital on 7/24 with abdominal pain, anorexia, weakness-he was thought to have sepsis syndrome-and subsequently started on antimicrobial therapy.  He was initially admitted to the intensive care unit-but stabilized and transferred to the [facility] on 7/27, unfortunately further hospital course was complicated by altered mental status/delirium-septic shock/acute hypoxic respiratory failure in the setting of aspiration pneumonia.  He was transferred to the ICU where he was intubated and did require pressor support.He had a prolonged stay in the ICU-complicated by development of AKI due to ATN requiring initiation of renal replacement therapy.  He eventually failed extubation and required tracheostomy placement.   He was transferred back to the [facility] on 8/17.   Significant events: 7/25>> SIRS-admit to ICU 7/27>> Transferred to [place], stopped abx as sepsis resolved/ruled out 7/30>> worsening hypoxia/new aspiration-worsening renal function-transfer to ICU-intubated  8/08>>failed extubation 8/8-reintubated 8/11>> tracheostomy placement 8/17>> transferred to [facility] 8/22>> pulled trach out x2-replaced by PCCM 8/25>> pulled trach out again unable to be reinserted by PCCM via bronchoscopy, transferred back to ICU.  Significant studies: 7/26>> Echo: EF 26-94%, grade 1 diastolic dysfunction 8/54>> CT head: Negative for bleed/acute intracranial process. 7/29>> EEG: No seizures. 8/01>> CT abdomen/pelvis: Inflammatory wall thickening/fat stranding around  descending/sigmoid colon.  Airspace opacity in the right lower lobe/right middle lobe. 8/05>> bilateral lower extremity Doppler: DVT right femoral vein, right proximal profunda vein, right popliteal vein 8/20>> CT abdomen/pelvis: Decreased bowel inflammation and fluid filling when compared to prior CT.  No SBO.  Antimicrobial therapy: Unasyn 7/30 >>7/31 Maxepime 7/31 >> 8/7 Flagyl 7/31 >>  8/7 Vancomycin 8/1 only  Anidulafungin 8/1>>8/3 Rocephin: 8/16>>8/17 Cefepime:8/17>>8/21  Microbiology data: 7/24>> blood cultures: Negative 7/25>> urine culture: Negative 7/26>> C. difficile studies: Negative 7/31>> respiratory culture: Moderate Candida tropicalis. 8/01>> GI pathogen panel: Negative 8/15>> tracheal aspirate: Pseudomonas aeruginosa   Procedures : 7/30>> 8/11: ETT 8/11>> tracheostomy placement 8/15>> left IJ TDC  Consults: PCCM, surgery, oncology, nephrology, palliative care  DVT Prophylaxis : apixaban (ELIQUIS) tablet 5 mg    Subjective:  Patient was seen earlier today, required in and out of 410 cc, staff were able to have patient sitting in the chair earlier today , patient pulled his trach out, unable to be reinserted by PCCM via bronchoscopy, transferred back to ICU .   Assessment/Plan:  Septic shock due to aspiration pneumonia:  - Sepsis physiology has resolved-culture data as above-has completed a course of antimicrobial therapy.  Afebrile-stable hemodynamics but tenuous that he is still accumulating secretions-and remains at risk for aspiration pneumonia.  Continue to closely monitor  Acute hypoxic respiratory failure due to aspiration pneumonia:  - Prolonged ICU stay-failed extubation attempt-subsequently underwent tracheostomy on 8/11.  -  He continues to have secretions and requires frequent suctioning.  On 8/22 he pulled his trach out x2.   -PCCM following for trach care.   -Patient pulled his trach again 8/25, notable attempts by PCCM via bronchoscopy to  reinsert, unsuccessful likely due to granulation tissue, patient will be transferred to  ICU, where he will likely need to be intubated.  Wife was called and updated about this event.Marland Kitchen  Dysphagia:  - In a setting of acute illness-respiratory failure-tracheostomy-severe deconditioning-on NG tube feedings currently-await further recommendations from SLP.  Remains NPO-do not think he is ready for a swallow evaluation-continues to have significant respiratory secretions.   -As well continues to have significant  encephalopathy, if no improvement likely will need PEG insertion .  Acute metabolic encephalopathy:  - Etiology felt to be due to acute illness/prolonged ICU stay/hypoxemia/sepsis-continues to have waxing and waning encephalopathy.  Somewhat anxious as well.   - Need to be cautious regarding oversedation which can worsen aspiration.  -He is with severe  encephalopathic/hospital delirium, yesterday he was lethargic, sleepy during most of the day, so I have decreased his Klonopin to 1 mg every 6 hours, and I have increased his Seroquel to 100 mg oral twice daily.  AKI on CKD stage IIIb:  - AKI due to ischemic ATN-per RN-around 190 cc of urine overnight on bladder scan-he did not void any urine.  Apparently a couple nights ago-he required in and out catheterization-and 700 cc of urine was obtained.  Getting frequent bladder scans-if urinary retention reoccurs-plan is to place Foley catheter.  Tolerated dialysis for approximately an hour-had around 900 cc of output with dialysis.  Unfortunately-no signs yet of any renal recovery.  Electrolytes remain relatively stable.  Nephrology following.  Per nephrology-if patient remains debilitated-agitated-and is unable to sit up-he will not be a long term candidate for outpatient hemodialysis.  Continue to follow closely-watch for signs of renal recovery.   -He is still making some urine, continue with bladder scan every 8 hours and as needed in and out, 410 cc  in and out overnight.  Right lower extremity DVT: Continue Eliquis.  PAF with RVR: Provoked by acute illness/sepsis-maintaining sinus rhythm.  Echo with stable EF.  On Eliquis for DVT.  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 Benay Spice. Previous MD Spoke with oncologist-Dr. Benay Spice on 8/23-no plans to resume chemotherapy-as he has no active cancer.  Per Dr. Evon Slack has had issues with high output ostomy in the past as well.  Colitis:  - Seen initially on CT abdomen on 8/1-was evaluated by general surgery-with recommendations to continue supportive care.  Since he continued to have abdominal pain-repeat CT was obtained on 8/ 20 which actually showed improvement in his colitis with no evidence of SBO.   -Patient continues to have abdominal pain as of 8/23, general surgery consult greatly appreciated, no acute concerning issues, this is possibly  related to hunger pain .  Anemia of critical illness: Hemoglobin continues to fluctuate but relatively stable.  No overt blood loss.  Continue periodic CBC monitoring.     Hyponatremia: Mild-will need to be managed with volume removal during HD.  Seizure disorder: Continue Keppra-EEG negative.  History of frontal meningioma-s/p resection 09/01/2016  HLD: Continue statin  DM-2 (A1c 8.7 on 7/25): CBG stable-continue 18 units of Levemir twice daily, 6 units of NovoLog every 4 hours and SSI.  Follow and adjust  Recent Labs    01/01/21 0403 01/01/21 0731 01/01/21 1127  GLUCAP 98 126* 49*    History of diabetic neuropathy: Resume Neurontin when able.  GERD: Continue PPI  Capecitabine induced desquamation/Hand/foot syndrome:Continue moisturizer to hands and feet and scrotum  Severe debility/deconditioning/dysphagia: Due to acute illness-continue PT/OT/SLP follow-up-likely will require SNF on  discharge.   Nutrition Status: Nutrition Problem: Moderate Malnutrition Etiology: chronic illness, cancer and cancer related treatments Signs/Symptoms: mild fat depletion, mild muscle depletion, percent weight loss Percent weight loss: 11 % Interventions: Tube feeding, Prostat  Pressure injury: Pressure Injury 12/01/20 Coccyx Mid Stage 2 -  Partial thickness loss of dermis presenting as a shallow open injury with a red, pink wound bed without slough. stage 2 decubitus to coxxyx (Active)  12/01/20 0900  Location: Coccyx  Location Orientation: Mid  Staging: Stage 2 -  Partial thickness loss of dermis presenting as a shallow open injury with a red, pink wound bed without slough.  Wound Description (Comments): stage 2 decubitus to coxxyx  Present on Admission: Yes     Pressure Injury 12/14/20 Face Upper;Mid Deep Tissue Pressure Injury - Purple or maroon localized area of discolored intact skin or blood-filled blister due to damage of underlying soft tissue from pressure and/or shear. circular shape broken area & sloug (Active)  12/14/20 0100  Location: Face  Location Orientation: Upper;Mid  Staging: Deep Tissue Pressure Injury - Purple or maroon localized area of discolored intact skin or blood-filled blister due to damage of underlying soft tissue from pressure and/or shear.  Wound Description (Comments): circular shape broken area & sloughing skin  Present on Admission: No     Pressure Injury 12/14/20 Face Left;Upper Deep Tissue Pressure Injury - Purple or maroon localized area of discolored intact skin or blood-filled blister due to damage of underlying soft tissue from pressure and/or shear. circular shape broken area & slou (Active)  12/14/20 0100  Location: Face  Location Orientation: Left;Upper  Staging: Deep Tissue Pressure Injury - Purple or maroon localized area of discolored intact skin or blood-filled blister due to damage of underlying soft tissue from pressure and/or shear.   Wound Description (Comments): circular shape broken area & sloughing skin  Present on Admission: No     Pressure Injury 12/23/20 Buttocks Left Stage 2 -  Partial thickness loss of dermis presenting as a shallow open injury with a red, pink wound bed without slough. left buttock stage 2 (Active)  12/23/20   Location: Buttocks  Location Orientation: Left  Staging: Stage 2 -  Partial thickness loss of dermis presenting as a shallow open injury with a red, pink wound bed without slough.  Wound Description (Comments): left buttock stage 2  Present on Admission: Yes   Obesity: Estimated body mass index is 36 kg/m as calculated from the following:   Height as of this encounter: _0  (1.727 m).   Weight as of this encounter: 107.4 kg.     Diet: Diet Order             Diet NPO time specified  Diet effective midnight                    Code Status: Full code   Family Communication: Wife was updated by phone 8/25, she was updated about that event and transfer to ICU.   Status is: Inpatient  Remains inpatient appropriate because:Inpatient level of care appropriate due to severity of illness  Dispo: The patient is from: Home              Anticipated d/c is to: SNF              Patient currently is not medically stable to d/c.   Difficult to place patient No   Barriers to Discharge: N.p.o.-NG tube in place-AKI/ATN requiring HD  Antimicrobial agents: Anti-infectives (  From admission, onward)    Start     Dose/Rate Route Frequency Ordered Stop   12/24/20 1730  ceFEPIme (MAXIPIME) 1 g in sodium chloride 0.9 % 100 mL IVPB        1 g 200 mL/hr over 30 Minutes Intravenous Every 24 hours 12/24/20 1642 12/29/20 0901   12/23/20 0845  cefTRIAXone (ROCEPHIN) 2 g in sodium chloride 0.9 % 100 mL IVPB  Status:  Discontinued        2 g 200 mL/hr over 30 Minutes Intravenous Every 24 hours 12/23/20 0800 12/24/20 1620   12/22/20 0600  ceFAZolin (ANCEF) IVPB 2g/100 mL premix        2  g 200 mL/hr over 30 Minutes Intravenous To Radiology 12/21/20 1503 12/22/20 1149   12/09/20 1800  vancomycin (VANCOREADY) IVPB 1750 mg/350 mL  Status:  Discontinued        1,750 mg 175 mL/hr over 120 Minutes Intravenous Every 24 hours 12/08/20 1357 12/09/20 1403   12/09/20 1700  anidulafungin (ERAXIS) 100 mg in sodium chloride 0.9 % 100 mL IVPB  Status:  Discontinued        100 mg 78 mL/hr over 100 Minutes Intravenous Every 24 hours 12/08/20 1359 12/09/20 1321   12/08/20 1500  vancomycin (VANCOCIN) 2,500 mg in sodium chloride 0.9 % 500 mL IVPB        2,500 mg 250 mL/hr over 120 Minutes Intravenous  Once 12/08/20 1357 12/08/20 1719   12/08/20 1500  anidulafungin (ERAXIS) 200 mg in sodium chloride 0.9 % 200 mL IVPB        200 mg 78 mL/hr over 200 Minutes Intravenous  Once 12/08/20 1359 12/08/20 1821   12/08/20 0000  ceFEPIme (MAXIPIME) 2 g in sodium chloride 0.9 % 100 mL IVPB  Status:  Discontinued        2 g 200 mL/hr over 30 Minutes Intravenous Every 12 hours 12/07/20 1200 12/14/20 0858   12/07/20 1000  metroNIDAZOLE (FLAGYL) IVPB 500 mg  Status:  Discontinued        500 mg 100 mL/hr over 60 Minutes Intravenous Every 12 hours 12/07/20 0849 12/14/20 0858   12/07/20 0900  ceFEPIme (MAXIPIME) 2 g in sodium chloride 0.9 % 100 mL IVPB  Status:  Discontinued        2 g 200 mL/hr over 30 Minutes Intravenous Every 24 hours 12/07/20 0809 12/07/20 1200   12/07/20 0830  metroNIDAZOLE (FLAGYL) IVPB 500 mg  Status:  Discontinued        500 mg 100 mL/hr over 60 Minutes Intravenous Every 8 hours 12/07/20 0758 12/07/20 0849   12/06/20 0900  Ampicillin-Sulbactam (UNASYN) 3 g in sodium chloride 0.9 % 100 mL IVPB  Status:  Discontinued        3 g 200 mL/hr over 30 Minutes Intravenous Every 12 hours 12/06/20 0832 12/07/20 0757   12/03/20 0800  ceFEPIme (MAXIPIME) 2 g in sodium chloride 0.9 % 100 mL IVPB  Status:  Discontinued        2 g 200 mL/hr over 30 Minutes Intravenous Every 8 hours 12/03/20 0746  12/04/20 1004   12/01/20 1000  ceFEPIme (MAXIPIME) 2 g in sodium chloride 0.9 % 100 mL IVPB  Status:  Discontinued        2 g 200 mL/hr over 30 Minutes Intravenous Every 12 hours 12/01/20 0513 12/03/20 0746   12/01/20 0300  vancomycin (VANCOREADY) IVPB 2000 mg/400 mL        2,000 mg 200 mL/hr over 120 Minutes  Intravenous  Once 12/01/20 0255 12/01/20 0528   11/30/20 2245  ceFEPIme (MAXIPIME) 2 g in sodium chloride 0.9 % 100 mL IVPB        2 g 200 mL/hr over 30 Minutes Intravenous STAT 11/30/20 2237 12/01/20 0003        Time spent: 35 minutes-Greater than 50% of this time was spent in counseling, explanation of diagnosis, planning of further management, and coordination of care.  MEDICATIONS: Scheduled Meds:  acetaminophen  1,000 mg Per Tube Q8H   apixaban  5 mg Per Tube BID   vitamin C  500 mg Oral TID   atorvastatin  20 mg Per Tube Daily   chlorhexidine gluconate (MEDLINE KIT)  15 mL Mouth Rinse BID   Chlorhexidine Gluconate Cloth  6 each Topical Q1200   Chlorhexidine Gluconate Cloth  6 each Topical Q0600   clonazepam  1 mg Per Tube Q6H   feeding supplement (PROSource TF)  45 mL Per Tube BID   fentaNYL  1 patch Transdermal Q72H   fiber  1 packet Per Tube BID   free water  100 mL Per Tube Q4H   glycopyrrolate  1 mg Per Tube BID   hydrocerin   Topical BID   insulin aspart  0-15 Units Subcutaneous Q4H   insulin aspart  6 Units Subcutaneous Q4H   insulin detemir  18 Units Subcutaneous BID   levETIRAcetam  500 mg Per Tube BID   mouth rinse  15 mL Mouth Rinse QID   multivitamin with minerals  1 tablet Oral Daily   pantoprazole sodium  40 mg Per Tube BID   QUEtiapine  100 mg Per Tube BID   thiamine  100 mg Per Tube Daily   Continuous Infusions:  sodium chloride Stopped (12/20/20 0955)   feeding supplement (NEPRO CARB STEADY) 1,000 mL (12/31/20 1635)   PRN Meds:.sodium chloride, albuterol, clonazePAM, dextrose, docusate, fentaNYL (SUBLIMAZE) injection, heparin sodium  (porcine), loperamide, sodium chloride flush   PHYSICAL EXAM: Vital signs: Vitals:   01/01/21 0043 01/01/21 0728 01/01/21 0945 01/01/21 1116  BP: 114/66 (!) 117/59 (!) 124/113   Pulse: 98  95 (!) 104  Resp: _0 Temp:  98 F (36.7 C)    TempSrc:  Oral    SpO2: 97%  99%   Weight:      Height:       Filed Weights   12/30/20 0851 12/30/20 1028 12/31/20 0500  Weight: 108.7 kg 107.6 kg 107.4 kg   Body mass index is 36 kg/m.   Awake , confused, frail, chronically ill-appearing. Symmetrical Chest wall movement, tachypneic Tachycardic,No Gallops,Rubs or new Murmurs, No Parasternal Heave +ve B.Sounds, Abd Soft, No tenderness, No rebound - guarding or rigidity. No Cyanosis, Clubbing or edema, No new Rash or bruise     I have personally reviewed following labs and imaging studies  LABORATORY DATA: CBC: Recent Labs  Lab 12/27/20 0444 12/28/20 0334 12/29/20 0341 12/31/20 0431 01/01/21 0410  WBC 12.0* 11.8* 13.0* 12.4* 13.8*  HGB 8.6* 8.3* 8.1* 7.2* 7.4*  HCT 25.0* 25.5* 24.2* 21.9* 21.8*  MCV 86.8 88.9 88.3 89.4 89.3  PLT 172 182 180 208 673    Basic Metabolic Panel: Recent Labs  Lab 12/28/20 0334 12/29/20 0341 12/30/20 0410 12/31/20 0431 01/01/21 0410  NA 129* 131* 131* 131* 129*  K 3.8 4.2 4.4 4.3 4.9  CL 93* 96* 96* 96* 94*  CO2 _1 21*  GLUCOSE 144* 139* 134* 173* 110*  BUN  26* 44* 58* 46* 59*  CREATININE 3.40* 4.44* 5.33* 4.44* 5.22*  CALCIUM 8.2* 8.4* 8.2* 8.2* 8.5*  PHOS 5.2* 6.0* 7.1* 6.2* 7.4*    GFR: Estimated Creatinine Clearance: 17 mL/min (A) (by C-G formula based on SCr of 5.22 mg/dL (H)).  Liver Function Tests: Recent Labs  Lab 12/28/20 0334 12/29/20 0341 12/30/20 0410 12/31/20 0431 01/01/21 0410  ALBUMIN 1.9* 1.7* 1.7* 1.7* 1.7*   No results for input(s): LIPASE, AMYLASE in the last 168 hours. No results for input(s): AMMONIA in the last 168 hours.  Coagulation Profile: No results for input(s): INR, PROTIME in the  last 168 hours.  Cardiac Enzymes: No results for input(s): CKTOTAL, CKMB, CKMBINDEX, TROPONINI in the last 168 hours.  BNP (last 3 results) No results for input(s): PROBNP in the last 8760 hours.  Lipid Profile: No results for input(s): CHOL, HDL, LDLCALC, TRIG, CHOLHDL, LDLDIRECT in the last 72 hours.  Thyroid Function Tests: No results for input(s): TSH, T4TOTAL, FREET4, T3FREE, THYROIDAB in the last 72 hours.  Anemia Panel: No results for input(s): VITAMINB12, FOLATE, FERRITIN, TIBC, IRON, RETICCTPCT in the last 72 hours.  Urine analysis:    Component Value Date/Time   COLORURINE YELLOW 12/01/2020 0200   APPEARANCEUR CLEAR 12/01/2020 0200   LABSPEC 1.023 12/01/2020 0200   PHURINE 5.0 12/01/2020 0200   GLUCOSEU >=500 (A) 12/01/2020 0200   HGBUR NEGATIVE 12/01/2020 0200   BILIRUBINUR NEGATIVE 12/01/2020 0200   KETONESUR NEGATIVE 12/01/2020 0200   PROTEINUR 100 (A) 12/01/2020 0200   UROBILINOGEN 0.2 09/27/2014 0301   NITRITE NEGATIVE 12/01/2020 0200   LEUKOCYTESUR NEGATIVE 12/01/2020 0200    Sepsis Labs: Lactic Acid, Venous    Component Value Date/Time   LATICACIDVEN 2.1 ([place]) 12/10/2020 0412    MICROBIOLOGY: No results found for this or any previous visit (from the past 240 hour(s)).   RADIOLOGY STUDIES/RESULTS: No results found.   LOS: 76 days   Phillips Climes, MD  [facility]    To contact the attending provider between 7A-7P or the covering provider during after hours 7P-7A, please log into the web site [email or web address] and access using universal [place] password for that web site. If you do not have the password, please call the hospital operator.  01/01/2021, 12:13 PM

## 2021-01-01 NOTE — Progress Notes (Addendum)
Inpatient Diabetes Program Recommendations  AACE/ADA: New Consensus Statement on Inpatient Glycemic Control (2015)  Target Ranges:  Prepandial:   less than 140 mg/dL      Peak postprandial:   less than 180 mg/dL (1-2 hours)      Critically ill patients:  140 - 180 mg/dL   Lab Results  Component Value Date   GLUCAP 118 (H) 01/01/2021   HGBA1C 8.7 (H) 12/01/2020    Review of Glycemic Control Results for Thomas [name], Thomas [name] (MRN [id number]) as of 01/01/2021 14:25  Ref. Range 12/31/2020 23:21 12/31/2020 23:56 01/01/2021 04:03 01/01/2021 07:31 01/01/2021 11:27 01/01/2021 12:41  Glucose-Capillary Latest Ref Range: 70 - 99 mg/dL 90 100 (H) 98 126 (H) 49 (L) 118 (H)   Diabetes history: DM 2 Outpatient Diabetes medications: Metformin 500 mg bid Current orders for Inpatient glycemic control:  Novolog moderate q 4 hours Novolog 4 units q 4 hours (to cover tube feeds) Levemir 18 units bid Inpatient Diabetes Program Recommendations:    Note that feeds are currently being held. Please d/c Novolog tube feed coverage while feeds are off.  Also consider reduction in Levemir to 10 units bid.  -May restart tube feed coverage when feeds restarted.  Thanks, Adah Perl, RN, BC-ADM Inpatient Diabetes Coordinator Pager [phone]  [phone]- Per RN tube feeds resumed.  May still need a reduction in Levemir.

## 2021-01-01 NOTE — Procedures (Signed)
Intubation Procedure Note KOAL ESLINGER [id number] 03/25/1956  Procedure: Intubation Indications: Airway protection and maintenance  Procedure Details Consent: Unable to obtain consent because of emergent medical necessity. Time Out: Verified patient identification, verified procedure, site/side was marked, verified correct patient position, special equipment/implants available, medications/allergies/relevent history reviewed, required imaging and test results available.  Performed  MAC and 4 Medications:  Fentanyl 100 mcg Etomidate 72m Versed 1 mg NMB rocuronium 100 mg   Evaluation Hemodynamic Status: BP stable throughout; O2 sats: stable throughout Patient's Current Condition: stable Complications: No apparent complications Patient did tolerate procedure well. Chest X-ray ordered to verify placement.  CXR: pending.  01/01/2021 called to bedside due to the fact patient pulled out his tracheostomy.  Unable to replace at bedside transferred to intensive care unit and intubated from about.  SRichardson LandryMinor ACNP LMaryanna ShapePCCM Pager [phone] 6424till 3 pm If no answer page [phone]/25/2022, 12:34 PM

## 2021-01-01 NOTE — Progress Notes (Signed)
Out Patient Arrangements:  Have been requested to follow for possible placement. Please advise if services are needed.   Linus Orn [facility] [phone]

## 2021-01-01 NOTE — Progress Notes (Signed)
Patient removed his trach again. RT and NP unable to replace tracheostomy tube. On [place], not hypoxic. Able to cough up secretions through stoma. Unsuccessful placement of trach over obturator. Attempts to place trach over bronchoscope in the trachea were unsuccessful due to obstructing granulation tissue that had been seen during trach removal bronchoscopy on Monday this week.  Back to ICU for reintubation until trach can be revised and granulation tissue can be addressed.  Julian Hy, DO 01/01/21 12:06 PM [facility]

## 2021-01-01 NOTE — Progress Notes (Addendum)
NAME:  Thomas [name], MRN:  [id number], DOB:  09-Jul-1955, LOS: 20 ADMISSION DATE:  11/30/2020, CONSULTATION DATE:  7/30 REFERRING MD:  Riccardo Dubin, CHIEF COMPLAINT:  Worsening hypoxia/dyspnea  History of Present Illness:  65 yo M admitted for worsening sepsis vs reaction to chemotherapy with high ostomy output and soft BP on 7/25. Has had issues during his hospitalization with recurrent aspirations and more recently worsening respiratory status.  Most recently pt developed worsening hypoxia and was requiring BIPAP w/ FIO2 of 100% and increased work of breathing.   Who was admitted to intensive care and subsequently had worsening clinical status requiring intubation, CRRT, multiple pressors and pneumonia along with CT findings 8/1 concerning for ischemic bowel.   Significant Hospital Events:  7/27 Transferred to [facility], stopped abx as sepsis resolved/ruled out 7/30 AM Worsened AME, more hypoxic with worsening renal funciton, new aspiration on abx restarted 7/30 PM Pt with increased o2 requirement on 100% FIO2 on BIPAP, d/w family who wanted to proceed with intubation, pt intubated 8/1 Some issues with tachycardia and hypotension overnight. Reproted high ostomy output  8/2 down to 9 mcg of Levophed, however repeat CT abdomen/pelvis showed inflammatory wall thickening and fat stranding of the descending and sigmoid colon, new since prior, consistent with nonspecific infectious, inflammatory, or ischemic colitis.  Also new dense consolidative airspace opacity in the right lower and right middle lobes 8/3 further weaning NE. Weaning sedation. Full code, full scope per family meeting 8/2  8/4 off pressors, plt down to 103.  8/5 remains off pressors. Moving around more, not really following commands though  8/9-failed extubation 8/8, tolerated nasal cannula for about an hour, desaturations and inability to clear secretions-reintubated 8/8 8/10 some agitation overnight 8/11 tracheostomy placement On trach  collar since 8/16 8/22 pulled trach out, replaced under bronch guidance. Has proximal area of granulation tissue that is above the bottom of the trach tube.   Micro: Resp viral panel 7/24 > neg covid/ flu BC  7/24  x 2 neg  MRSA  7/25 neg  UC   7/25  neg  C diff screeen 7/26  neg ET culture 7/31 > moderate yeast> grew mod candida tropicalis GI panel  8/1> neg   Abx: Unasyn 7/30 >>7/31 Maxepime 7/31 >> 8/7, 8/17> 8/21 Flagyl 7/31 >>  8/7 Vancomycin 8/1 only  Anidulafungin 8/1>>8/3  Interim History / Subjective:  Has worked with SLP, tolerated PMV but has not been talking. He has been refusing full HD treatments. Significant agitation this week-- being addressed by Palliative Care. Afebrile, oxygen requirements remain minimal.  Today his RN is concerned that he has had a lot of thin, clear secretions from trach and he has increased WOB when he is trying to cough them up. He has been complaining of abdominal pain and frequently asks for his wife to be called.  Objective   Blood pressure (!) 117/59, pulse 98, temperature 98 F (36.7 C), temperature source Oral, resp. rate 20, height 5\' 8"  (1.727 m), weight 107.4 kg, SpO2 97 %.    FiO2 (%):  [28 %-35 %] 28 %   Intake/Output Summary (Last 24 hours) at 01/01/2021 0859 Last data filed at 01/01/2021 0537 Gross per 24 hour  Intake --  Output 660 ml  Net -660 ml    Filed Weights   12/30/20 0851 12/30/20 1028 12/31/20 0500  Weight: 108.7 kg 107.6 kg 107.4 kg     Physical exam: General: chronically ill appearing man sitting up in bed, agitated, frequently  beating his hands against the bed rails and his left hand against his abdomen. HEENT: [place]/AT, eyes anicteric, cortrak. Copious oral secretions with near constant drooling from the R side of his mouth. Dried secretions inside his mouth. Neck: trach in place, no bleeding around trach Neuro: Awake, moving all extremities. Moderate strength cough. Trying to write to communicate.     Chest: Tachypnea associated with agitation. Thin secretions suctioned out of trach x 3. CTAB after suctioning. Heart: S1S2, mildly tachycardic, reg rhythm Abdomen: mildly distended, soft Skin: peeling skin, no active ulcerating lesions or ongoing desquamation. Warm & dry.  WBC 13.8  Resolved Hospital Problem list   Shock  HHS Afib RVR  Lactic Acidosis  Thrombocytopenia colitis  Assessment & Plan:   Acute hypoxemic respiratory failure s/p tracheostomy on 8/11 Aspiration pneumonia Endotracheal granulation tissue at tracheostomy site; superior to distal end of trach so not occlusive with trach in Patient had failed extubation 8/8 secondary to increased secretions. Tracheostomy performed 8/11 due to excessive secretions, due to difficult vent wean. -Continue trach collar. Wean supplemental oxygen as able to maintain saturations greater than 90%. -Continue routine trach care.  Tracheostomy changed today with confirmation of correct placement via bronchoscope. -Unfortunately he likely still needs mitt restraints when he is unsupervised to prevent him from pulling his trach out again.  Consider low dose seroquel or haldol to help with agitation to allow for necessary care, which may help liberalize him from restraints more often. -Nebs and CPT as needed-- discussed with RT today. I worry his thin tracheal secretions are poorly managed upper respiratory secretions he is aspirating. No concerning appearance to tracheal secretions to suggest infection. -Continue speech therapy and Passy-Muir valve trials as able.  -Before eventual decannulation will need reassessment of area of granulation tissue to ensure it is not obstructing.  Leukocytosis -Monitor for fevers or other suggestions of infection. No significant concern for pulmonary source given unconcerning appearance of his tracheal secretions.  PCCM will follow up next week for trach. In the interim, please page PCCM on call pager with  questions.  Julian Hy, DO 01/01/21 9:59 AM [facility]  For contact information, see Amion. If no response to pager, please call PCCM consult pager. After hours, 7PM- 7AM, please call Elink.     Addendum:  Patient moved to ICU after dislodging his trach and unsuccessful replacement on the floor. He was in moderate respiratory distress and has needed emergent airway placement, so he was endotracheally intubated emergently upon arrival to ICU.  BP (!) 124/113 (BP Location: Right Arm)   Pulse (!) 104   Temp 98 F (36.7 C) (Oral)   Resp 15   Ht 5\' 8"  (1.727 m)   Wt 107.4 kg   SpO2 100%   BMI 36.00 kg/m  Chronically ill appearing man lying in bed in moderate distress Poorly managing oral secretions Trach removed, some bloody secretions through trach ostomy.  Tachypnea Tachycardic, reg rhythm Abd nondistended No LE edema  CXR personally reviewed> ETT about 1.5 cm above carina. Well aerated lungs.  Assessment & plan:  Acute respiratory failure with hypoxia Inability to manage secretions Pending loss of airway due to displaced tracheostomy tube Upper airway granulation tissue -Emergently moved to ICU, intubated -ENT consulted; appreciate their assistance with getting trach replaced -LTVV, 4-8cc/kg IBW with goal Pplat<30 and DP<15 -VAP prevention protocol -PAD protocol for sedation -wife updated by Dr. Waldron Labs -restraints  AKI requiring CVVHD; not currently a candidate for outpatient dialysis due  to severe deconditioning -con't HD per nephrology -renally dose meds, avoid nephrotoxic meds  Agitation, metabolic encephalopathy -appreciate palliative care's assistance -PAD protocol -ok to con't enteral meds  RLE DVT -stop eliquis, switch to heparin until ENT can intervene  PAF -monitor on tele -con't metoprolol -heparin until able to resume eliquis  Dysphagia -can resume SLP once trach replaced  H/o seizure disorder -keppra  Type  2 DM -con't insulin -goal BG 140-180  Stage III colon cancer, underdoing chemo as an OP PTA Ongoing left sided abdominal pain; possibly related to intermittent ischemia in low flow state -appreciate surgery's evaluation this week -fentanyl infusion -ostomy care  This patient is critically ill with multiple organ system failure which requires frequent high complexity decision making, assessment, support, evaluation, and titration of therapies. This was completed through the application of advanced monitoring technologies and extensive interpretation of multiple databases. During this encounter critical care time was devoted to patient care services described in this note for 55 minutes.   Julian Hy, DO 01/01/21 12:58 PM [facility]

## 2021-01-01 NOTE — Progress Notes (Signed)
NAME:  Thomas [name], MRN:  [id number], DOB:  1955-10-04, LOS: 79 ADMISSION DATE:  11/30/2020, CONSULTATION DATE:  7/30 REFERRING MD:  Riccardo Dubin, CHIEF COMPLAINT:  Worsening hypoxia/dyspnea  History of Present Illness:  65 yo M admitted for worsening sepsis vs reaction to chemotherapy with high ostomy output and soft BP on 7/25. Has had issues during his hospitalization with recurrent aspirations and more recently worsening respiratory status.  Most recently pt developed worsening hypoxia and was requiring BIPAP w/ FIO2 of 100% and increased work of breathing.   Who was admitted to intensive care and subsequently had worsening clinical status requiring intubation, CRRT, multiple pressors and pneumonia along with CT findings 8/1 concerning for ischemic bowel.   Significant Hospital Events:  7/27 Transferred to [facility], stopped abx as sepsis resolved/ruled out 7/30 AM Worsened AME, more hypoxic with worsening renal funciton, new aspiration on abx restarted 7/30 PM Pt with increased o2 requirement on 100% FIO2 on BIPAP, d/w family who wanted to proceed with intubation, pt intubated 8/1 Some issues with tachycardia and hypotension overnight. Reproted high ostomy output  8/2 down to 9 mcg of Levophed, however repeat CT abdomen/pelvis showed inflammatory wall thickening and fat stranding of the descending and sigmoid colon, new since prior, consistent with nonspecific infectious, inflammatory, or ischemic colitis.  Also new dense consolidative airspace opacity in the right lower and right middle lobes 8/3 further weaning NE. Weaning sedation. Full code, full scope per family meeting 8/2  8/4 off pressors, plt down to 103.  8/5 remains off pressors. Moving around more, not really following commands though  8/9-failed extubation 8/8, tolerated nasal cannula for about an hour, desaturations and inability to clear secretions-reintubated 8/8 8/10 some agitation overnight 8/11 tracheostomy placement On trach  collar since 8/16 8/22 pulled trach out, replaced under bronch guidance. Has proximal area of granulation tissue that is above the bottom of the trach tube.   Micro: Resp viral panel 7/24 > neg covid/ flu BC  7/24  x 2 neg  MRSA  7/25 neg  UC   7/25  neg  C diff screeen 7/26  neg ET culture 7/31 > moderate yeast> grew mod candida tropicalis GI panel  8/1> neg   Abx: Unasyn 7/30 >>7/31 Maxepime 7/31 >> 8/7, 8/17> 8/21 Flagyl 7/31 >>  8/7 Vancomycin 8/1 only  Anidulafungin 8/1>>8/3  Interim History / Subjective:  Patient pulled out tracheostomy respiratory distress transferred to intensive care unit reintubated.  Objective   Blood pressure (!) 124/113, pulse (!) 104, temperature 98 F (36.7 C), temperature source Oral, resp. rate 15, height 5\' 8"  (1.727 m), weight 107.4 kg, SpO2 100 %.    Vent Mode: PRVC FiO2 (%):  [28 %-50 %] 50 % Set Rate:  [16 bmp] 16 bmp Vt Set:  [540 mL] 540 mL PEEP:  [5 cmH20] 5 cmH20 Plateau Pressure:  [15 cmH20] 15 cmH20   Intake/Output Summary (Last 24 hours) at 01/01/2021 1238 Last data filed at 01/01/2021 0537 Gross per 24 hour  Intake --  Output 410 ml  Net -410 ml   Filed Weights   12/30/20 0851 12/30/20 1028 12/31/20 0500  Weight: 108.7 kg 107.6 kg 107.4 kg     Physical exam:  General: Obese male who is now on full mechanical ventilatory support HEENT: MM pink/moist, tracheostomy stoma now occluded Neuro: Does not follow commands  CV: Heart sounds are regular PULM: Coarse rhonchi Vent pressure regulated volume FIO2 100% PEEP 5 RATE 16 VT 8 cc/kg  GI:  soft, bsx4 active distended   Extremities: warm/dry, 2+ edema  Skin: no rashes or lesions   Resolved Hospital Problem list   Shock  HHS Afib RVR  Lactic Acidosis  Thrombocytopenia colitis  Assessment & Plan:   Acute hypoxemic respiratory failure s/p tracheostomy on 8/11 Aspiration pneumonia Endotracheal granulation tissue at tracheostomy site; superior to distal end  of trach so not occlusive with trach in Patient had failed extubation 8/8 secondary to increased secretions. Tracheostomy performed 8/11 due to excessive secretions, due to difficult vent wean.  01/01/2021 patient removed his tracheostomy replaced at bedside transferred to intensive care unit intubated from above.  Mechanical ventilatory support 24-hour Consider antimicrobial therapy to manipulation tracheostomy FiO2 to keep sats greater than 92% ENT consult versus bedside dilatation tracheostomy. Mittens to be placed for now to prevent endotracheal tube self removal Intermittent sedation,  Resolving septic shock Continue to monitor  Renal insufficiency Lab Results  Component Value Date   CREATININE 5.22 (H) 01/01/2021   CREATININE 4.44 (H) 12/31/2020   CREATININE 5.33 (H) 12/30/2020   CREATININE 1.83 (H) 11/19/2020   CREATININE 1.56 (H) 10/29/2020   CREATININE 1.34 (H) 10/08/2020   Hemodialysis as needed  Colorectal cancer Per oncology   Richardson Landry Tyah Acord ACNP Acute Care Nurse Practitioner [place] Please consult Amion 01/01/2021, 12:38 PM Tube feeding

## 2021-01-01 NOTE — Progress Notes (Signed)
ANTICOAGULATION CONSULT NOTE - Initial Consult  Pharmacy Consult for: heparin infusion Indication: RLE DVT 8/5  No Known Allergies  Patient Measurements: Height: 5\' 8"  (172.7 cm) Weight: 107.4 kg (236 lb 12.4 oz) IBW/kg (Calculated) : 68.4 Heparin Dosing Weight: 92 kg  Vital Signs: Temp: 98.6 F (37 C) (08/25 1226) Temp Source: Axillary (08/25 1226) BP: 124/113 (08/25 0945) Pulse Rate: 104 (08/25 1116)  Labs: Recent Labs    12/30/20 0410 12/31/20 0431 01/01/21 0410  HGB  --  7.2* 7.4*  HCT  --  21.9* 21.8*  PLT  --  208 248  CREATININE 5.33* 4.44* 5.22*    Estimated Creatinine Clearance: 17 mL/min (A) (by C-G formula based on SCr of 5.22 mg/dL (H)).   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   Dizziness    Dyspnea    pt states d/t weight   Epilepsy ([place])    takes Keppra daily   GERD (gastroesophageal reflux disease)    takes Omeprazole daily   Headache    Hyperlipidemia    takes Atorvastatin daily   Hypertension    takes Lotrel daily   Peripheral edema    takes Lasix daily   Peripheral neuropathy    takes Gabapentin daily   Pneumonia 3 yrs ago   hx of   Seizures (HCC)    Sleep apnea    Urinary frequency     Medications:  Infusions:   sodium chloride Stopped (12/20/20 0955)   feeding supplement (NEPRO CARB STEADY) Stopped (01/01/21 1120)   fentaNYL infusion INTRAVENOUS      Assessment: HPI/CC: 65 yo M presenting with potentially sepsis/ chemotherapy reaction.  Transitioned from CRRT to [facility] Diagnosed with RLE DVT on 8/5. Started on apixaban 5mg  BID. Last dose 8/25 AM.  Now transitioning to heparin drip in anticipation of  trach placement.   Previously therapeutic on 1250 u/hr on 8/16  Goal of Therapy:  Heparin level 0.3-0.7 units/ml Monitor platelets by anticoagulation protocol: Yes   Plan:  Start heparin infusion at 1250 units/hr Check anti-Xa level in 8 hours and daily while on heparin Continue to monitor H&H and platelets Will also check aPTT in 8 hours as patient recently on apixaban.   Donnald Garre, PharmD Clinical Pharmacist  Please check AMION for all [place] numbers After 10:00 PM, call [place] [phone]

## 2021-01-01 NOTE — Progress Notes (Signed)
SLP Cancellation Note  Patient Details Name: Thomas [name] MRN: [id number] DOB: 1956-01-09   Cancelled treatment:       Reason Eval/Treat Not Completed: Medical issues which prohibited therapy. Note pt with removal of trach, unable to be replaced due to granulation tissue per MD notes, reintubated with plans for future bronchoscopy and granulation tissue removal. SLP will sign off, please reconsult for future SLP needs.    [place], [place]   01/01/2021, 2:11 PM

## 2021-01-01 NOTE — Progress Notes (Signed)
[facility] NEPHROLOGY PROGRESS NOTE  Assessment/ Plan:  # Dialysis dependent AKI/CKD stage IIIb - ischemic ATN in setting of severe sepsis and multisystem organ failure.  Admission creatinine was 2.1 and earlier on CRRT (stopped after 8/9) and transitioned to IHD-underwent left IJ TDC placement on 8/15.  Now oliguric.  Noted palliative care team has seen patient during this hospitalization. Family desires to continue current full scope of treatment.  Cont HD on THS Schedule: 2K, TDC, no heparin, 3.5 hours, 2 to 3 L UF    # Altered mental status-with prior history of CVA in 2015 and seizure disorder,.  Per TRH  # HTN/Vol: BPs stable. Weights stable. UF as able   # Sepsis/aspiration pneumonia -resolved. Per TRH  # Chronic hypoxic respiratory failure -status post tracheostomy and ongoing supplementation via T-bar. Per TRH / CCM  # Stage IIIB colon cancer - sp partial colectomy, colostomy. On chemoRx sp 3 cycles.  # Right leg DVT - anticoagulation per primary team.  # Atrial fibrillation - on Eliquis, rate controlled.  # Anemia of critical illness: Transfuse as needed. holding erythropoietin with active malignancy.  #CKD-BMD: Intermittent hyperphosphatemia, trend.  Calcium stable.   Subjective:  No interval events Change in labs consistent with low GFR 0.4 L urine output yesterday  Objective Vital signs in last 24 hours: Vitals:   01/01/21 0043 01/01/21 0728 01/01/21 0945 01/01/21 1116  BP: 114/66 (!) 117/59 (!) 124/113   Pulse: 98  95 (!) 104  Resp: 20 20 18    Temp:  98 F (36.7 C)    TempSrc:  Oral    SpO2: 97%  99%   Weight:      Height:       Weight change:   Intake/Output Summary (Last 24 hours) at 01/01/2021 1154 Last data filed at 01/01/2021 0537 Gross per 24 hour  Intake --  Output 410 ml  Net -410 ml        Labs: Basic Metabolic Panel: Recent Labs  Lab 12/30/20 0410 12/31/20 0431 01/01/21 0410  NA 131* 131* 129*  K 4.4 4.3 4.9   CL 96* 96* 94*  CO2 22 23 21*  GLUCOSE 134* 173* 110*  BUN 58* 46* 59*  CREATININE 5.33* 4.44* 5.22*  CALCIUM 8.2* 8.2* 8.5*  PHOS 7.1* 6.2* 7.4*    Liver Function Tests: Recent Labs  Lab 12/30/20 0410 12/31/20 0431 01/01/21 0410  ALBUMIN 1.7* 1.7* 1.7*    No results for input(s): LIPASE, AMYLASE in the last 168 hours. No results for input(s): AMMONIA in the last 168 hours. CBC: Recent Labs  Lab 12/27/20 0444 12/28/20 0334 12/29/20 0341 12/31/20 0431 01/01/21 0410  WBC 12.0* 11.8* 13.0* 12.4* 13.8*  HGB 8.6* 8.3* 8.1* 7.2* 7.4*  HCT 25.0* 25.5* 24.2* 21.9* 21.8*  MCV 86.8 88.9 88.3 89.4 89.3  PLT 172 182 180 208 248    Cardiac Enzymes: No results for input(s): CKTOTAL, CKMB, CKMBINDEX, TROPONINI in the last 168 hours. CBG: Recent Labs  Lab 12/31/20 2321 12/31/20 2356 01/01/21 0403 01/01/21 0731 01/01/21 1127  GLUCAP 90 100* 98 126* 49*     Iron Studies: No results for input(s): IRON, TIBC, TRANSFERRIN, FERRITIN in the last 72 hours. Studies/Results: No results found.  Medications: Infusions:  sodium chloride Stopped (12/20/20 0955)   feeding supplement (NEPRO CARB STEADY) 1,000 mL (12/31/20 1635)    Scheduled Medications:  acetaminophen  1,000 mg Per Tube Q8H   apixaban  5 mg Per Tube BID   vitamin C  500 mg Oral TID   atorvastatin  20 mg Per Tube Daily   chlorhexidine gluconate (MEDLINE KIT)  15 mL Mouth Rinse BID   Chlorhexidine Gluconate Cloth  6 each Topical Q1200   Chlorhexidine Gluconate Cloth  6 each Topical Q0600   clonazepam  1 mg Per Tube Q6H   feeding supplement (PROSource TF)  45 mL Per Tube BID   fentaNYL  1 patch Transdermal Q72H   fiber  1 packet Per Tube BID   free water  100 mL Per Tube Q4H   glycopyrrolate  1 mg Per Tube BID   hydrocerin   Topical BID   insulin aspart  0-15 Units Subcutaneous Q4H   insulin aspart  6 Units Subcutaneous Q4H   insulin detemir  18 Units Subcutaneous BID   levETIRAcetam  500 mg Per Tube BID    mouth rinse  15 mL Mouth Rinse QID   multivitamin with minerals  1 tablet Oral Daily   pantoprazole sodium  40 mg Per Tube BID   QUEtiapine  100 mg Per Tube BID   thiamine  100 mg Per Tube Daily    have reviewed scheduled and prn medications.  Physical Exam: General: Critically ill looking male on tracheal collar, resting Heart:RRR, s1s2 nl Lungs: Coarse breath sounds anteriorly Abdomen:soft, Non-tender, non-distended Extremities: Trace b/l LE edema  with chronic venous stasis change. Dialysis Access: [facility] in place.   Odell Fasching B Jennica Tagliaferri 01/01/2021,11:54 AM  LOS: 31 days

## 2021-01-01 NOTE — Progress Notes (Addendum)
Was called into room by Nurse Tech who stated that PT pulled trach and monitoring wires off. PT anxious and agitated. First SaO2 was 100% when sensor replaced. Immediately requested assistance from Charge, Respiratory and primary MD. PT had been in chair and was lifted back to bed with numerous staff at bedside to assist. Attempt to replace trach back though was unsuccessful. Critical care NP also at bedside to assist. There were no documented hypoxic events up to this time. Continuing to work to re establish a secured airway.

## 2021-01-01 NOTE — Progress Notes (Addendum)
I attempted to call Mrs Saetern x 2 on her mobile number.  Julian Hy, DO 01/01/21 3:21 PM [facility]  Mrs. Shockley updated. All questions answered.  Julian Hy, DO 01/01/21 3:24 PM  Pulmonary & Critical Care

## 2021-01-02 ENCOUNTER — Ambulatory Visit: Payer: Self-pay | Admitting: Otolaryngology

## 2021-01-02 ENCOUNTER — Other Ambulatory Visit: Payer: Self-pay | Admitting: Otolaryngology

## 2021-01-02 DIAGNOSIS — G9341 Metabolic encephalopathy: Secondary | ICD-10-CM | POA: Diagnosis not present

## 2021-01-02 LAB — GLUCOSE, CAPILLARY
Glucose-Capillary: 137 mg/dL — ABNORMAL HIGH (ref 70–99)
Glucose-Capillary: 137 mg/dL — ABNORMAL HIGH (ref 70–99)
Glucose-Capillary: 116 mg/dL — ABNORMAL HIGH (ref 70–99)
Glucose-Capillary: 154 mg/dL — ABNORMAL HIGH (ref 70–99)
Glucose-Capillary: 118 mg/dL — ABNORMAL HIGH (ref 70–99)
Glucose-Capillary: 121 mg/dL — ABNORMAL HIGH (ref 70–99)

## 2021-01-02 LAB — RENAL FUNCTION PANEL
Albumin: 1.9 g/dL — ABNORMAL LOW (ref 3.5–5.0)
Anion gap: 12 (ref 5–15)
BUN: 34 mg/dL — ABNORMAL HIGH (ref 8–23)
CO2: 24 mmol/L (ref 22–32)
Calcium: 8.5 mg/dL — ABNORMAL LOW (ref 8.9–10.3)
Chloride: 95 mmol/L — ABNORMAL LOW (ref 98–111)
Creatinine, Ser: 3.57 mg/dL — ABNORMAL HIGH (ref 0.61–1.24)
GFR, Estimated: 18 mL/min — ABNORMAL LOW (ref >60)
Glucose, Bld: 131 mg/dL — ABNORMAL HIGH (ref 70–99)
Phosphorus: 5.2 mg/dL — ABNORMAL HIGH (ref 2.5–4.6)
Potassium: 4 mmol/L (ref 3.5–5.1)
Sodium: 131 mmol/L — ABNORMAL LOW (ref 135–145)

## 2021-01-02 LAB — CBC
HCT: 22.4 % — ABNORMAL LOW (ref 39.0–52.0)
Hemoglobin: 7.3 g/dL — ABNORMAL LOW (ref 13.0–17.0)
MCH: 29.1 pg (ref 26.0–34.0)
MCHC: 32.6 g/dL (ref 30.0–36.0)
MCV: 89.2 fL (ref 80.0–100.0)
Platelets: 254 10*3/uL (ref 150–400)
RBC: 2.51 MIL/uL — ABNORMAL LOW (ref 4.22–5.81)
RDW: 16.7 % — ABNORMAL HIGH (ref 11.5–15.5)
WBC: 16.1 10*3/uL — ABNORMAL HIGH (ref 4.0–10.5)
nRBC: 0 % (ref 0.0–0.2)

## 2021-01-02 LAB — HEPARIN LEVEL (UNFRACTIONATED): Heparin Unfractionated: 0.85 IU/mL — ABNORMAL HIGH (ref 0.30–0.70)

## 2021-01-02 LAB — MRSA NEXT GEN BY PCR, NASAL

## 2021-01-02 LAB — APTT
aPTT: 76 seconds — ABNORMAL HIGH (ref 24–36)
aPTT: 86 seconds — ABNORMAL HIGH (ref 24–36)

## 2021-01-02 LAB — VITAMIN C: Vitamin C: 2.8 mg/dL — ABNORMAL HIGH (ref 0.4–2.0)

## 2021-01-02 MED ORDER — LOPERAMIDE HCL 1 MG/7.5ML PO SUSP
2.0000 mg | ORAL | Status: DC | PRN
  Administered 2021-01-02: 2 mg
  Filled 2021-01-02 (×2): qty 15

## 2021-01-02 MED ORDER — DOCUSATE SODIUM 50 MG/5ML PO LIQD: 100.0000 mg | Freq: Two times a day (BID) | ORAL | Status: DC | PRN

## 2021-01-02 MED ORDER — ASCORBIC ACID 500 MG PO TABS
500.0000 mg | ORAL_TABLET | Freq: Three times a day (TID) | ORAL | Status: DC
  Administered 2021-01-02 – 2021-01-28 (×76): 500 mg
  Filled 2021-01-02 (×78): qty 1

## 2021-01-02 MED ORDER — CHLORHEXIDINE GLUCONATE CLOTH 2 % EX PADS
6.0000 | MEDICATED_PAD | Freq: Every day | CUTANEOUS | Status: DC
  Administered 2021-01-02 – 2021-01-13 (×11): 6 via TOPICAL

## 2021-01-02 MED ORDER — ADULT MULTIVITAMIN W/MINERALS CH: 1.0000 | ORAL_TABLET | Freq: Every day | ORAL | Status: DC

## 2021-01-02 MED ORDER — RENA-VITE PO TABS
1.0000 | ORAL_TABLET | Freq: Every day | ORAL | Status: DC
  Administered 2021-01-02 – 2021-01-28 (×26): 1
  Filled 2021-01-02 (×28): qty 1

## 2021-01-02 MED ORDER — POLYETHYLENE GLYCOL 3350 17 G PO PACK: 17.0000 g | PACK | Freq: Every day | ORAL | Status: DC | PRN

## 2021-01-02 NOTE — H&P (View-Only) (Signed)
Reason for Consult: Tracheostomy complications Referring Physician: Marshell Garfinkel, MD  Thomas [name] is an 65 y.o. male.  HPI: Long appropriate history, requiring multiple intubations oral ultimate tracheostomy that has been pulled out multiple times.  The last time yesterday was not able to be replaced.  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   Dizziness    Dyspnea    pt states d/t weight   Epilepsy ([place])    takes Keppra daily   GERD (gastroesophageal reflux disease)    takes Omeprazole daily   Headache    Hyperlipidemia    takes Atorvastatin daily   Hypertension    takes Lotrel daily   Peripheral edema    takes Lasix daily   Peripheral neuropathy    takes Gabapentin daily   Pneumonia 3 yrs ago   hx of   Seizures ([place])    Sleep apnea    Urinary frequency     Past Surgical History:  Procedure Laterality Date   BIOPSY  09/05/2020   Procedure: BIOPSY;  Surgeon: Irene Shipper, MD;  Location[facility] ENDOSCOPY;  Service: Endoscopy;;   CARDIAC CATHETERIZATION  7 yrs ago   COLONOSCOPY WITH PROPOFOL N/A 09/05/2020   Procedure: COLONOSCOPY WITH PROPOFOL;  Surgeon: Irene Shipper, MD;  Location: [facility] ENDOSCOPY;  Service: Endoscopy;  Laterality: N/A;   CRANIOTOMY N/A 09/01/2016   Procedure: CRANIOTOMY TUMOR  LEFT PTERIONAL;  Surgeon: Ashok Pall, MD;  Location: [facility];  Service: Neurosurgery;  Laterality: N/A;   cyst removed from chest      as a child   IR FLUORO GUIDE CV LINE LEFT  12/22/2020   IR US GUIDE VASC ACCESS LEFT  12/22/2020   NO PAST SURGERIES     PARTIAL COLECTOMY N/A 09/10/2020   Procedure: TRANSVERSE COLECTOMY;  Surgeon: [name], MD;  Location: [place];   Service: General;  Laterality: N/A;   POLYPECTOMY  09/05/2020   Procedure: POLYPECTOMY;  Surgeon: Irene Shipper, MD;  Location: [facility] [facility];  Service: Endoscopy;;   PORTACATH PLACEMENT Right 10/01/2020   Procedure: INSERTION PORT-A-CATH;  Surgeon: [name], MD;  Location: [place];  Service: General;  Laterality: Right;   SUBMUCOSAL TATTOO INJECTION  09/05/2020   Procedure: SUBMUCOSAL TATTOO INJECTION;  Surgeon: Irene Shipper, MD;  Location: [facility] ENDOSCOPY;  Service: Endoscopy;;    Family History  Problem Relation Age of Onset   Breast cancer Mother    Breast cancer Maternal Aunt    Prostate cancer Maternal Uncle     Social History:  reports that he has quit smoking. He has never used smokeless tobacco. He reports that he does not drink alcohol and does not use drugs.  Allergies: No Known Allergies  Medications: Reviewed  Results for orders placed or performed during the hospital encounter of 11/30/20 (from the past 48 hour(s))  Glucose, capillary     Status: Abnormal   Collection Time: 12/31/20 12:28 PM  Result Value Ref Range   Glucose-Capillary 127 (H) 70 - 99 mg/dL    Comment: Glucose reference range applies only to samples taken after fasting for at  least 8 hours.  Glucose, capillary     Status: Abnormal   Collection Time: 12/31/20  4:26 PM  Result Value Ref Range   Glucose-Capillary 137 (H) 70 - 99 mg/dL    Comment: Glucose reference range applies only to samples taken after fasting for at least 8 hours.  Glucose, capillary     Status: Abnormal   Collection Time: 12/31/20  7:52 PM  Result Value Ref Range   Glucose-Capillary 50 (L) 70 - 99 mg/dL    Comment: Glucose reference range applies only to samples taken after fasting for at least 8 hours.  Glucose, capillary     Status: Abnormal   Collection Time: 12/31/20  7:53 PM  Result Value Ref Range   Glucose-Capillary 57 (L) 70 - 99 mg/dL    Comment: Glucose reference range applies only to samples taken after fasting for  at least 8 hours.  Glucose, capillary     Status: None   Collection Time: 12/31/20  8:33 PM  Result Value Ref Range   Glucose-Capillary 78 70 - 99 mg/dL    Comment: Glucose reference range applies only to samples taken after fasting for at least 8 hours.  Glucose, capillary     Status: None   Collection Time: 12/31/20  9:58 PM  Result Value Ref Range   Glucose-Capillary 88 70 - 99 mg/dL    Comment: Glucose reference range applies only to samples taken after fasting for at least 8 hours.  Glucose, capillary     Status: None   Collection Time: 12/31/20 11:21 PM  Result Value Ref Range   Glucose-Capillary 90 70 - 99 mg/dL    Comment: Glucose reference range applies only to samples taken after fasting for at least 8 hours.  Glucose, capillary     Status: Abnormal   Collection Time: 12/31/20 11:56 PM  Result Value Ref Range   Glucose-Capillary 100 (H) 70 - 99 mg/dL    Comment: Glucose reference range applies only to samples taken after fasting for at least 8 hours.  Glucose, capillary     Status: None   Collection Time: 01/01/21  4:03 AM  Result Value Ref Range   Glucose-Capillary 98 70 - 99 mg/dL    Comment: Glucose reference range applies only to samples taken after fasting for at least 8 hours.  Renal function panel     Status: Abnormal   Collection Time: 01/01/21  4:10 AM  Result Value Ref Range   Sodium 129 (L) 135 - 145 mmol/L   Potassium 4.9 3.5 - 5.1 mmol/L   Chloride 94 (L) 98 - 111 mmol/L   CO2 21 (L) 22 - 32 mmol/L   Glucose, Bld 110 (H) 70 - 99 mg/dL    Comment: Glucose reference range applies only to samples taken after fasting for at least 8 hours.   BUN 59 (H) 8 - 23 mg/dL   Creatinine, Ser 5.22 (H) 0.61 - 1.24 mg/dL   Calcium 8.5 (L) 8.9 - 10.3 mg/dL   Phosphorus 7.4 (H) 2.5 - 4.6 mg/dL   Albumin 1.7 (L) 3.5 - 5.0 g/dL   GFR, Estimated 12 (L) >60 mL/min    Comment: (NOTE) Calculated using the CKD-EPI Creatinine Equation (2021)    Anion gap 14 5 - 15    Comment:  Performed at [facility] [address], [place], [place] [address]  CBC     Status: Abnormal   Collection Time: 01/01/21  4:10 AM  Result Value Ref Range  WBC 13.8 (H) 4.0 - 10.5 K/uL   RBC 2.44 (L) 4.22 - 5.81 MIL/uL   Hemoglobin 7.4 (L) 13.0 - 17.0 g/dL   HCT 21.8 (L) 39.0 - 52.0 %   MCV 89.3 80.0 - 100.0 fL   MCH 30.3 26.0 - 34.0 pg   MCHC 33.9 30.0 - 36.0 g/dL   RDW 17.0 (H) 11.5 - 15.5 %   Platelets 248 150 - 400 K/uL   nRBC 0.0 0.0 - 0.2 %    Comment: Performed at [facility] [address] [place], [place] [address]  Glucose, capillary     Status: Abnormal   Collection Time: 01/01/21  7:31 AM  Result Value Ref Range   Glucose-Capillary 126 (H) 70 - 99 mg/dL    Comment: Glucose reference range applies only to samples taken after fasting for at least 8 hours.  Glucose, capillary     Status: Abnormal   Collection Time: 01/01/21 11:27 AM  Result Value Ref Range   Glucose-Capillary 49 (L) 70 - 99 mg/dL    Comment: Glucose reference range applies only to samples taken after fasting for at least 8 hours.  Glucose, capillary     Status: Abnormal   Collection Time: 01/01/21 12:41 PM  Result Value Ref Range   Glucose-Capillary 118 (H) 70 - 99 mg/dL    Comment: Glucose reference range applies only to samples taken after fasting for at least 8 hours.  I-STAT 7, (LYTES, BLD GAS, ICA, H+H)     Status: Abnormal   Collection Time: 01/01/21  2:30 PM  Result Value Ref Range   pH, Arterial 7.409 7.350 - 7.450   pCO2 arterial 32.0 32.0 - 48.0 mmHg   pO2, Arterial 153 (H) 83.0 - 108.0 mmHg   Bicarbonate 20.2 20.0 - 28.0 mmol/L   TCO2 21 (L) 22 - 32 mmol/L   O2 Saturation 99.0 %   Acid-base deficit 4.0 (H) 0.0 - 2.0 mmol/L   Sodium 130 (L) 135 - 145 mmol/L   Potassium 4.8 3.5 - 5.1 mmol/L   Calcium, Ion 1.08 (L) 1.15 - 1.40 mmol/L   HCT 22.0 (L) 39.0 - 52.0 %   Hemoglobin 7.5 (L) 13.0 - 17.0 g/dL   Collection site Radial    Drawn by RT    Sample type ARTERIAL    Glucose, capillary     Status: None   Collection Time: 01/01/21  3:29 PM  Result Value Ref Range   Glucose-Capillary 71 70 - 99 mg/dL    Comment: Glucose reference range applies only to samples taken after fasting for at least 8 hours.  Glucose, capillary     Status: Abnormal   Collection Time: 01/01/21  7:22 PM  Result Value Ref Range   Glucose-Capillary 109 (H) 70 - 99 mg/dL    Comment: Glucose reference range applies only to samples taken after fasting for at least 8 hours.  Glucose, capillary     Status: Abnormal   Collection Time: 01/01/21 11:36 PM  Result Value Ref Range   Glucose-Capillary 163 (H) 70 - 99 mg/dL    Comment: Glucose reference range applies only to samples taken after fasting for at least 8 hours.  Glucose, capillary     Status: Abnormal   Collection Time: 01/02/21  3:31 AM  Result Value Ref Range   Glucose-Capillary 137 (H) 70 - 99 mg/dL    Comment: Glucose reference range applies only to samples taken after fasting for at least 8 hours.  Renal function  panel     Status: Abnormal   Collection Time: 01/02/21  6:04 AM  Result Value Ref Range   Sodium 131 (L) 135 - 145 mmol/L   Potassium 4.0 3.5 - 5.1 mmol/L   Chloride 95 (L) 98 - 111 mmol/L   CO2 24 22 - 32 mmol/L   Glucose, Bld 131 (H) 70 - 99 mg/dL    Comment: Glucose reference range applies only to samples taken after fasting for at least 8 hours.   BUN 34 (H) 8 - 23 mg/dL   Creatinine, Ser 3.57 (H) 0.61 - 1.24 mg/dL   Calcium 8.5 (L) 8.9 - 10.3 mg/dL   Phosphorus 5.2 (H) 2.5 - 4.6 mg/dL   Albumin 1.9 (L) 3.5 - 5.0 g/dL   GFR, Estimated 18 (L) >60 mL/min    Comment: (NOTE) Calculated using the CKD-EPI Creatinine Equation (2021)    Anion gap 12 5 - 15    Comment: Performed at [facility] [address] [place], [place] [address]  CBC     Status: Abnormal   Collection Time: 01/02/21  6:04 AM  Result Value Ref Range   WBC 16.1 (H) 4.0 - 10.5 K/uL   RBC 2.51 (L) 4.22 - 5.81 MIL/uL    Hemoglobin 7.3 (L) 13.0 - 17.0 g/dL   HCT 22.4 (L) 39.0 - 52.0 %   MCV 89.2 80.0 - 100.0 fL   MCH 29.1 26.0 - 34.0 pg   MCHC 32.6 30.0 - 36.0 g/dL   RDW 16.7 (H) 11.5 - 15.5 %   Platelets 254 150 - 400 K/uL   nRBC 0.0 0.0 - 0.2 %    Comment: Performed at [facility] [address] [place], [place] [address]  APTT     Status: Abnormal   Collection Time: 01/02/21  6:04 AM  Result Value Ref Range   aPTT 76 (H) 24 - 36 seconds    Comment:        IF BASELINE aPTT IS ELEVATED, SUGGEST PATIENT RISK ASSESSMENT BE USED TO DETERMINE APPROPRIATE ANTICOAGULANT THERAPY. Performed at [facility], [place] [address], [place], [place] [address]   Heparin level (unfractionated)     Status: Abnormal   Collection Time: 01/02/21  6:06 AM  Result Value Ref Range   Heparin Unfractionated 0.85 (H) 0.30 - 0.70 IU/mL    Comment: (NOTE) The clinical reportable range upper limit is being lowered to >1.10 to align with the FDA approved guidance for the current laboratory assay.  If heparin results are below expected values, and patient dosage has  been confirmed, suggest follow up testing of antithrombin III levels. Performed at [facility], [place] [address] [place], [place] [address]   Glucose, capillary     Status: Abnormal   Collection Time: 01/02/21  7:59 AM  Result Value Ref Range   Glucose-Capillary 137 (H) 70 - 99 mg/dL    Comment: Glucose reference range applies only to samples taken after fasting for at least 8 hours.   Comment 1 Notify RN   Glucose, capillary     Status: Abnormal   Collection Time: 01/02/21 11:11 AM  Result Value Ref Range   Glucose-Capillary 116 (H) 70 - 99 mg/dL    Comment: Glucose reference range applies only to samples taken after fasting for at least 8 hours.    Portable Chest x-ray  Result Date: 01/01/2021 CLINICAL DATA:  Intubation. EXAM: PORTABLE CHEST 1 VIEW COMPARISON:  Chest x-ray dated December 27, 2020. FINDINGS: Patient is rotated to the  right, limiting evaluation. Tracheostomy tube is been removed and there is a new endotracheal tube with the tip 1.3 cm above the carina. Unchanged feeding tube entering the stomach with the tip below the field of view. Unchanged tunneled left internal jugular dialysis catheter and right chest wall port catheter. Stable cardiomediastinal silhouette. Normal pulmonary vascularity. Low lung volumes. No focal consolidation, pleural effusion, or pneumothorax. No acute osseous abnormality. IMPRESSION: 1. New endotracheal tube with the tip 1.3 cm above the carina. Consider retracting 1-2 cm. 2. No active disease. Electronically Signed   By: Titus Dubin M.D.   On: 01/01/2021 13:05    [id number] except as listed in admit H&P  Blood pressure 132/72, pulse (!) 103, temperature 98.5 F (36.9 C), temperature source Axillary, resp. rate 15, height 5\' 8"  (1.727 m), weight 105.3 kg, SpO2 98 %.  PHYSICAL EXAM: Overall appearance: Orally intubated and sedated Head:  Normocephalic, atraumatic. Ears: External a ears look healthy. Nose: External nose is healthy in appearance. Internal nasal exam free of any lesions or obstruction. Oral Cavity/Pharynx:  There are no mucosal lesions or masses identified. Larynx/Hypopharynx: Deferred Neuro: Sedated on ventilator. Neck: No palpable neck masses.  Tracheostomy dressing in place.  Studies Reviewed: none  Procedures: none   Assessment/Plan: Proceed with tracheostomy revision on Monday.  [name] 01/02/2021, 11:30 AM

## 2021-01-02 NOTE — Progress Notes (Signed)
ANTICOAGULATION CONSULT NOTE - Initial Consult  Pharmacy Consult for: heparin infusion Indication: RLE DVT 8/5  No Known Allergies  Patient Measurements: Height: 5\' 8"  (172.7 cm) Weight: 105.3 kg (232 lb 2.3 oz) IBW/kg (Calculated) : 68.4 Heparin Dosing Weight: 92 kg  Vital Signs: Temp: 98.5 F (36.9 C) (08/26 1112) Temp Source: Axillary (08/26 1112) BP: 132/72 (08/26 1100) Pulse Rate: 103 (08/26 1100)  Labs: Recent Labs    12/31/20 0431 01/01/21 0410 01/01/21 1430 01/02/21 0604 01/02/21 0606  HGB 7.2* 7.4* 7.5* 7.3*  --   HCT 21.9* 21.8* 22.0* 22.4*  --   PLT 208 248  --  254  --   APTT  --   --   --  76*  --   HEPARINUNFRC  --   --   --   --  0.85*  CREATININE 4.44* 5.22*  --  3.57*  --      Estimated Creatinine Clearance: 24.6 mL/min (A) (by C-G formula based on SCr of 3.57 mg/dL (H)).   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   Dizziness    Dyspnea    pt states d/t weight   Epilepsy ([facility])    takes Keppra daily   GERD (gastroesophageal reflux disease)    takes Omeprazole daily   Headache    Hyperlipidemia    takes Atorvastatin daily   Hypertension    takes Lotrel daily   Peripheral edema    takes Lasix daily   Peripheral neuropathy    takes Gabapentin daily   Pneumonia 3 yrs ago   hx of   Seizures ([facility])    Sleep apnea    Urinary frequency     Medications:  Infusions:   sodium chloride Stopped (12/20/20 0955)   feeding supplement (NEPRO CARB STEADY) 1,000 mL (01/02/21 1040)   fentaNYL infusion INTRAVENOUS 100 mcg/hr (01/02/21 0900)   heparin 1,250 Units/hr (01/02/21 0900)    HPI/CC:  65 yo M presenting with  potentially sepsis/ chemotherapy reaction.    Assessment: Transitioned from CRRT to [facility]. Diagnosed with RLE DVT on 8/5. Started on apixaban 5mg  BID. Last dose 8/25 AM.  Heparin drip started in the meantime in anticipation of trach placement. Heparin resumed at previously therapeutic rate of 1250 u/hr on 8/25 leading to therapeutic aPTT on 8/26. Heparin level does not yet correlate to aPTT, so will continue monitoring with heparin level and aPTT.   Goal of Therapy:  Heparin level 0.3-0.7 units/ml Monitor platelets by anticoagulation protocol: Yes   Plan:  Continue heparin infusion at 1250 units/hr Check confirmatory aPTT level in 8 hours  Check aPTT and heparin level with morning labs Continue to monitor H&H and platelets  Thank you for allowing pharmacy to participate in this patient's care.  Reatha Harps, PharmD PGY1 Pharmacy Resident 01/02/2021 11:27 AM Check [email or web address] for unit specific pharmacy number

## 2021-01-02 NOTE — Progress Notes (Signed)
PT Cancellation Note  Patient Details Name: Thomas [name] MRN: [id number] DOB: 1955/12/07   Cancelled Treatment:    Reason Eval/Treat Not Completed: Patient's level of consciousness. Pt sedated on vent. Only brief eye opening to tactile stimuli.   [place] 01/02/2021, 1:41 PM [place] Pager [phone] Office [phone]

## 2021-01-02 NOTE — Progress Notes (Addendum)
NAME:  ARIEZ NEILAN, MRN:  [id number], DOB:  Feb 21, 1956, LOS: 15 ADMISSION DATE:  11/30/2020, CONSULTATION DATE:  7/30 REFERRING MD:  Riccardo Dubin, CHIEF COMPLAINT:  Worsening hypoxia/dyspnea  History of Present Illness:  65 yo M admitted for worsening sepsis vs reaction to chemotherapy with high ostomy output and soft BP on 7/25. Has had issues during his hospitalization with recurrent aspirations and more recently worsening respiratory status.  Most recently pt developed worsening hypoxia and was requiring BIPAP w/ FIO2 of 100% and increased work of breathing.   Who was admitted to intensive care and subsequently had worsening clinical status requiring intubation, CRRT, multiple pressors and pneumonia along with CT findings 8/1 concerning for ischemic bowel.   Significant Hospital Events:  7/27 Transferred to [facility], stopped abx as sepsis resolved/ruled out 7/30 AM Worsened AME, more hypoxic with worsening renal funciton, new aspiration on abx restarted 7/30 PM Pt with increased o2 requirement on 100% FIO2 on BIPAP, d/w family who wanted to proceed with intubation, pt intubated 8/1 Some issues with tachycardia and hypotension overnight. Reproted high ostomy output  8/2 down to 9 mcg of Levophed, however repeat CT abdomen/pelvis showed inflammatory wall thickening and fat stranding of the descending and sigmoid colon, new since prior, consistent with nonspecific infectious, inflammatory, or ischemic colitis.  Also new dense consolidative airspace opacity in the right lower and right middle lobes 8/3 further weaning NE. Weaning sedation. Full code, full scope per family meeting 8/2  8/4 off pressors, plt down to 103.  8/5 remains off pressors. Moving around more, not really following commands though  8/9-failed extubation 8/8, tolerated nasal cannula for about an hour, desaturations and inability to clear secretions-reintubated 8/8 8/10 some agitation overnight 8/11 tracheostomy placement On trach  collar since 8/16 8/22 pulled trach out, replaced under bronch guidance. Has proximal area of granulation tissue that is above the bottom of the trach tube.  8/25 Removed Trach, unable to reinsert, despite multiple attempts reintubated  Micro: Resp viral panel 7/24 > neg covid/ flu BC  7/24  x 2 neg  MRSA  7/25 neg  UC   7/25  neg  C diff screeen 7/26  neg ET culture 7/31 > moderate yeast> grew mod candida tropicalis GI panel  8/1> neg   Abx: Unasyn 7/30 >>7/31 Maxepime 7/31 >> 8/7, 8/17> 8/21 Flagyl 7/31 >>  8/7 Vancomycin 8/1 only  Anidulafungin 8/1>>8/3  Interim History / Subjective:  Patient pulled out tracheostomy respiratory distress transferred to intensive care unit reintubated. No acute issues over night, per night staff patient was restless, either asleep or wide awake, no in between, asking for precedex in additional to fentanyl.  Objective   Blood pressure (!) 143/75, pulse 90, temperature 98.8 F (37.1 C), temperature source Oral, resp. rate 15, height 5\' 8"  (1.727 m), weight 105.3 kg, SpO2 100 %.    Vent Mode: PRVC FiO2 (%):  [28 %-50 %] 30 % Set Rate:  [15 bmp-16 bmp] 15 bmp Vt Set:  [540 mL] 540 mL PEEP:  [5 cmH20] 5 cmH20 Plateau Pressure:  [15 cmH20-17 cmH20] 15 cmH20   Intake/Output Summary (Last 24 hours) at 01/02/2021 0815 Last data filed at 01/02/2021 0700 Gross per 24 hour  Intake 2284.91 ml  Output 4125 ml  Net -1840.09 ml   Filed Weights   12/31/20 0500 01/01/21 1730 01/02/21 0500  Weight: 107.4 kg 106 kg 105.3 kg     Physical exam:  General: Obese male who is now on full mechanical  ventilatory support, sedated  and calm, in NAD HEENT: MM pink/moist, tracheostomy stoma now occluded with dressing, no leak noted Neuro: Sedated, not following commands , MAE x 4 CV: Heart sounds are regular, No RMG, SR per tele, no ectopy PULM: Bilateral chest excursion, few rhonchi, diminished per bases Vent PRVC Mode, 30%, Peep 5, rate 15, TV 540 with sats of  100% Sedation 150 mcg/ hr Fentanyl [email or web address]  FIO2 30% PEEP 5 RATE 15 VT 8 cc/kg  GI: soft, bsx4 active distended Extremities: warm/dry, 1+ edema  Skin: no rashes or lesions  Na 131, K 4, Cl 95, BUN 34, Creatinine 3.57, Phos 5.2,  WBC 16.1, HGB 7.3, platelets 254, T Max 98.8 Post Intubation CXR 8/25>> New endotracheal tube with the tip 1.3 cm above the carina. No active disease. Net Negative 11.6 L 1125 of stool out last 24 hours  Resolved Hospital Problem list   Shock  HHS Afib RVR  Lactic Acidosis  Thrombocytopenia colitis  Assessment & Plan:   Acute hypoxemic respiratory failure s/p tracheostomy on 8/11 Aspiration pneumonia Endotracheal granulation tissue at tracheostomy site; superior to distal end of trach so not occlusive with trach in Patient had failed extubation 8/8 secondary to increased secretions. Tracheostomy performed 8/11 due to excessive secretions, due to difficult vent wean.  01/01/2021 patient removed his tracheostomy replaced at bedside transferred to intensive care unit intubated from above.  Mechanical ventilatory support 24-hour Consider antimicrobial therapy due to manipulation tracheostomy FiO2 to keep sats greater than 92% ENT consulted for dilatation tracheostomy with goal of liberation from mechanical ventilation. Once trach inserter, SBT and wean to ATC [place] to be placed for now to prevent endotracheal tube self removal Fentanyl gtt, RASS Goal 0  Resolving septic shock Continue to monitor  Renal insufficiency Lab Results  Component Value Date   CREATININE 3.57 (H) 01/02/2021   CREATININE 5.22 (H) 01/01/2021   CREATININE 4.44 (H) 12/31/2020   CREATININE 1.83 (H) 11/19/2020   CREATININE 1.56 (H) 10/29/2020   CREATININE 1.34 (H) 10/08/2020   Hemodialysis as needed Trend BMET Replete electrolytes as needed  Hyponatremia Plan Will d/c free water Trend  BMET  Diarrhea Contributing to hyponatremia 1126 of stool out last 24 hours Plan Imodium prn Culture if non-resolved  or develops fever Fluid repletion as needed  Colorectal cancer Per oncology  CCM APP Time 35 minutes  Magdalen Spatz, MSN, AGACNP-BC [place] for personal pager PCCM on call pager [phone]  01/02/2021, 8:15 AM

## 2021-01-02 NOTE — Progress Notes (Signed)
[facility] NEPHROLOGY PROGRESS NOTE  Assessment/ Plan:  # Dialysis dependent AKI/CKD stage IIIb - ischemic ATN in setting of severe sepsis and multisystem organ failure.  Admission creatinine was 2.1 and earlier on CRRT (stopped after 8/9) and transitioned to IHD-underwent left IJ TDC placement on 8/15.  Now oliguric at best.  Noted palliative care team has seen patient during this hospitalization. Family desires to continue current full scope of treatment.  Cont HD on [facility] Schedule: 2K, TDC, no heparin, 3 hours, 2 to 3 L UF    # Altered mental status-with prior history of CVA in 2015 and seizure disorder.  Per TR[facility]  # HTN/Vol: BPs stable. Weights stable. UF as able   # Sepsis/aspiration pneumonia -resolved. Per TR[facility]  # Chronic hypoxic respiratory failure -status post tracheostomy patient removed tracheostomy 8/25 requiring intubation, plan per CCM  # Stage IIIB colon cancer - sp partial colectomy, colostomy. On chemoRx sp 3 cycles.  Oncology has been following along  # Right leg DVT - anticoagulation per primary team.  # Atrial fibrillation - on Eliquis, rate controlled.  # Anemia of critical illness: Transfuse as needed. holding erythropoietin with active malignancy.  #CKD-BMD: Intermittent hyperphosphatemia, trend.  Calcium stable.  Subjective:  Patient removed trach yesterday, required intubation, now in ICU Received dialysis yesterday, 3 L UF, tolerated well; was done after intubation with sedation Plan for revision of trach early next week No reported urine output yesterday  Objective Vital signs in last 24 hours: Vitals:   01/02/21 0744 01/02/21 0800 01/02/21 0900 01/02/21 1050  BP:  (!) 143/75 (!) 142/76 128/75  Pulse:  90 91 (!) 107  Resp: 15 15 15  (!) 40  Temp:  98.8 F (37.1 C)    TempSrc:  Oral    SpO2:  100% 100% 97%  Weight:      Height:       Weight change:   Intake/Output Summary (Last 24 hours) at 01/02/2021 1109 Last data filed at  01/02/2021 0900 Gross per 24 hour  Intake 2447.84 ml  Output 4275 ml  Net -1827.16 ml        Labs: Basic Metabolic Panel: Recent Labs  Lab 12/31/20 0431 01/01/21 0410 01/01/21 1430 01/02/21 0604  NA 131* 129* 130* 131*  K 4.3 4.9 4.8 4.0  CL 96* 94*  --  95*  CO2 23 21*  --  24  GLUCOSE 173* 110*  --  131*  BUN 46* 59*  --  34*  CREATININE 4.44* 5.22*  --  3.57*  CALCIUM 8.2* 8.5*  --  8.5*  PHOS 6.2* 7.4*  --  5.2*    Liver Function Tests: Recent Labs  Lab 12/31/20 0431 01/01/21 0410 01/02/21 0604  ALBUMIN 1.7* 1.7* 1.9*    No results for input(s): LIPASE, AMYLASE in the last 168 hours. No results for input(s): AMMONIA in the last 168 hours. CBC: Recent Labs  Lab 12/28/20 0334 12/29/20 0341 12/31/20 0431 01/01/21 0410 01/01/21 1430 01/02/21 0604  WBC 11.8* 13.0* 12.4* 13.8*  --  16.1*  HGB 8.3* 8.1* 7.2* 7.4* 7.5* 7.3*  HCT 25.5* 24.2* 21.9* 21.8* 22.0* 22.4*  MCV 88.9 88.3 89.4 89.3  --  89.2  PLT 182 180 208 248  --  254    Cardiac Enzymes: No results for input(s): CKTOTAL, CKMB, CKMBINDEX, TROPONINI in the last 168 hours. CBG: Recent Labs  Lab 01/01/21 1529 01/01/21 1922 01/01/21 2336 01/02/21 0331 01/02/21 0759  GLUCAP 71 109* 163* 137* 137*  Iron Studies: No results for input(s): IRON, TIBC, TRANSFERRIN, FERRITIN in the last 72 hours. Studies/Results: Portable Chest x-ray  Result Date: 01/01/2021 CLINICAL DATA:  Intubation. EXAM: PORTABLE CHEST 1 VIEW COMPARISON:  Chest x-ray dated December 27, 2020. FINDINGS: Patient is rotated to the right, limiting evaluation. Tracheostomy tube is been removed and there is a new endotracheal tube with the tip 1.3 cm above the carina. Unchanged feeding tube entering the stomach with the tip below the field of view. Unchanged tunneled left internal jugular dialysis catheter and right chest wall port catheter. Stable cardiomediastinal silhouette. Normal pulmonary vascularity. Low lung volumes. No focal  consolidation, pleural effusion, or pneumothorax. No acute osseous abnormality. IMPRESSION: 1. New endotracheal tube with the tip 1.3 cm above the carina. Consider retracting 1-2 cm. 2. No active disease. Electronically Signed   By: Titus Dubin M.D.   On: 01/01/2021 13:05    Medications: Infusions:  sodium chloride Stopped (12/20/20 0955)   feeding supplement (NEPRO CARB STEADY) 1,000 mL (01/02/21 1040)   fentaNYL infusion INTRAVENOUS 100 mcg/hr (01/02/21 0900)   heparin 1,250 Units/hr (01/02/21 0900)    Scheduled Medications:  acetaminophen  1,000 mg Per Tube Q8H   vitamin C  500 mg Per Tube TID   atorvastatin  20 mg Per Tube Daily   chlorhexidine gluconate (MEDLINE KIT)  15 mL Mouth Rinse BID   Chlorhexidine Gluconate Cloth  6 each Topical Daily   clonazepam  1 mg Per Tube Q6H   feeding supplement (PROSource TF)  45 mL Per Tube BID   fiber  1 packet Per Tube BID   glycopyrrolate  1 mg Per Tube BID   hydrocerin   Topical BID   insulin aspart  0-15 Units Subcutaneous Q4H   insulin aspart  4 Units Subcutaneous Q4H   insulin detemir  18 Units Subcutaneous BID   levETIRAcetam  500 mg Per Tube BID   mouth rinse  15 mL Mouth Rinse 10 times per day   multivitamin  1 tablet Per Tube QHS   pantoprazole sodium  40 mg Per Tube BID   QUEtiapine  100 mg Per Tube BID   thiamine  100 mg Per Tube Daily    have reviewed scheduled and prn medications.  Physical Exam: General: Critically ill looking male, intubated and sedated Heart:RRR, s1s2 nl Lungs: Coarse breath sounds anteriorly Abdomen:soft, Non-tender, non-distended Extremities: Trace b/l LE edema  with chronic venous stasis change. Dialysis Access: [facility] in place.   Rexene Agent 01/02/2021,11:09 AM  LOS: 32 days

## 2021-01-02 NOTE — Consult Note (Signed)
Reason for Consult: Tracheostomy complications Referring Physician: Marshell Garfinkel, MD  Thomas [name] is an 65 y.o. male.  HPI: Long appropriate history, requiring multiple intubations oral ultimate tracheostomy that has been pulled out multiple times.  The last time yesterday was not able to be replaced.  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   Dizziness    Dyspnea    pt states d/t weight   Epilepsy ([place])    takes Keppra daily   GERD (gastroesophageal reflux disease)    takes Omeprazole daily   Headache    Hyperlipidemia    takes Atorvastatin daily   Hypertension    takes Lotrel daily   Peripheral edema    takes Lasix daily   Peripheral neuropathy    takes Gabapentin daily   Pneumonia 3 yrs ago   hx of   Seizures ([place])    Sleep apnea    Urinary frequency     Past Surgical History:  Procedure Laterality Date   BIOPSY  09/05/2020   Procedure: BIOPSY;  Surgeon: Irene Shipper, MD;  Location: [facility] ENDOSCOPY;  Service: Endoscopy;;   CARDIAC CATHETERIZATION  7 yrs ago   COLONOSCOPY WITH PROPOFOL N/A 09/05/2020   Procedure: COLONOSCOPY WITH PROPOFOL;  Surgeon: Irene Shipper, MD;  Location: [facility] ENDOSCOPY;  Service: Endoscopy;  Laterality: N/A;   CRANIOTOMY N/A 09/01/2016   Procedure: CRANIOTOMY TUMOR  LEFT PTERIONAL;  Surgeon: Ashok Pall, MD;  Location: [place];  Service: Neurosurgery;  Laterality: N/A;   cyst removed from chest      as a child   IR FLUORO GUIDE CV LINE LEFT  12/22/2020   IR US GUIDE VASC ACCESS LEFT  12/22/2020   NO PAST SURGERIES     PARTIAL COLECTOMY N/A 09/10/2020   Procedure: TRANSVERSE COLECTOMY;  Surgeon: [name], MD;  Location: [place];   Service: General;  Laterality: N/A;   POLYPECTOMY  09/05/2020   Procedure: POLYPECTOMY;  Surgeon: Irene Shipper, MD;  Location: [place];  Service: Endoscopy;;   PORTACATH PLACEMENT Right 10/01/2020   Procedure: INSERTION PORT-A-CATH;  Surgeon: [name], MD;  Location: [place];  Service: General;  Laterality: Right;   SUBMUCOSAL TATTOO INJECTION  09/05/2020   Procedure: SUBMUCOSAL TATTOO INJECTION;  Surgeon: Irene Shipper, MD;  Location[facility] ENDOSCOPY;  Service: Endoscopy;;    Family History  Problem Relation Age of Onset   Breast cancer Mother    Breast cancer Maternal Aunt    Prostate cancer Maternal Uncle     Social History:  reports that he has quit smoking. He has never used smokeless tobacco. He reports that he does not drink alcohol and does not use drugs.  Allergies: No Known Allergies  Medications: Reviewed  Results for orders placed or performed during the hospital encounter of 11/30/20 (from the past 48 hour(s))  Glucose, capillary     Status: Abnormal   Collection Time: 12/31/20 12:28 PM  Result Value Ref Range   Glucose-Capillary 127 (H) 70 - 99 mg/dL    Comment: Glucose reference range applies only to samples taken after fasting for at  least 8 hours.  Glucose, capillary     Status: Abnormal   Collection Time: 12/31/20  4:26 PM  Result Value Ref Range   Glucose-Capillary 137 (H) 70 - 99 mg/dL    Comment: Glucose reference range applies only to samples taken after fasting for at least 8 hours.  Glucose, capillary     Status: Abnormal   Collection Time: 12/31/20  7:52 PM  Result Value Ref Range   Glucose-Capillary 50 (L) 70 - 99 mg/dL    Comment: Glucose reference range applies only to samples taken after fasting for at least 8 hours.  Glucose, capillary     Status: Abnormal   Collection Time: 12/31/20  7:53 PM  Result Value Ref Range   Glucose-Capillary 57 (L) 70 - 99 mg/dL    Comment: Glucose reference range applies only to samples taken after fasting for  at least 8 hours.  Glucose, capillary     Status: None   Collection Time: 12/31/20  8:33 PM  Result Value Ref Range   Glucose-Capillary 78 70 - 99 mg/dL    Comment: Glucose reference range applies only to samples taken after fasting for at least 8 hours.  Glucose, capillary     Status: None   Collection Time: 12/31/20  9:58 PM  Result Value Ref Range   Glucose-Capillary 88 70 - 99 mg/dL    Comment: Glucose reference range applies only to samples taken after fasting for at least 8 hours.  Glucose, capillary     Status: None   Collection Time: 12/31/20 11:21 PM  Result Value Ref Range   Glucose-Capillary 90 70 - 99 mg/dL    Comment: Glucose reference range applies only to samples taken after fasting for at least 8 hours.  Glucose, capillary     Status: Abnormal   Collection Time: 12/31/20 11:56 PM  Result Value Ref Range   Glucose-Capillary 100 (H) 70 - 99 mg/dL    Comment: Glucose reference range applies only to samples taken after fasting for at least 8 hours.  Glucose, capillary     Status: None   Collection Time: 01/01/21  4:03 AM  Result Value Ref Range   Glucose-Capillary 98 70 - 99 mg/dL    Comment: Glucose reference range applies only to samples taken after fasting for at least 8 hours.  Renal function panel     Status: Abnormal   Collection Time: 01/01/21  4:10 AM  Result Value Ref Range   Sodium 129 (L) 135 - 145 mmol/L   Potassium 4.9 3.5 - 5.1 mmol/L   Chloride 94 (L) 98 - 111 mmol/L   CO2 21 (L) 22 - 32 mmol/L   Glucose, Bld 110 (H) 70 - 99 mg/dL    Comment: Glucose reference range applies only to samples taken after fasting for at least 8 hours.   BUN 59 (H) 8 - 23 mg/dL   Creatinine, Ser 5.22 (H) 0.61 - 1.24 mg/dL   Calcium 8.5 (L) 8.9 - 10.3 mg/dL   Phosphorus 7.4 (H) 2.5 - 4.6 mg/dL   Albumin 1.7 (L) 3.5 - 5.0 g/dL   GFR, Estimated 12 (L) >60 mL/min    Comment: (NOTE) Calculated using the CKD-EPI Creatinine Equation (2021)    Anion gap 14 5 - 15    Comment:  Performed at [facility] [address], [place], [place] [address]  CBC     Status: Abnormal   Collection Time: 01/01/21  4:10 AM  Result Value Ref Range  WBC 13.8 (H) 4.0 - 10.5 K/uL   RBC 2.44 (L) 4.22 - 5.81 MIL/uL   Hemoglobin 7.4 (L) 13.0 - 17.0 g/dL   HCT 21.8 (L) 39.0 - 52.0 %   MCV 89.3 80.0 - 100.0 fL   MCH 30.3 26.0 - 34.0 pg   MCHC 33.9 30.0 - 36.0 g/dL   RDW 17.0 (H) 11.5 - 15.5 %   Platelets 248 150 - 400 K/uL   nRBC 0.0 0.0 - 0.2 %    Comment: Performed at [facility] [address], [place], [place] [address]  Glucose, capillary     Status: Abnormal   Collection Time: 01/01/21  7:31 AM  Result Value Ref Range   Glucose-Capillary 126 (H) 70 - 99 mg/dL    Comment: Glucose reference range applies only to samples taken after fasting for at least 8 hours.  Glucose, capillary     Status: Abnormal   Collection Time: 01/01/21 11:27 AM  Result Value Ref Range   Glucose-Capillary 49 (L) 70 - 99 mg/dL    Comment: Glucose reference range applies only to samples taken after fasting for at least 8 hours.  Glucose, capillary     Status: Abnormal   Collection Time: 01/01/21 12:41 PM  Result Value Ref Range   Glucose-Capillary 118 (H) 70 - 99 mg/dL    Comment: Glucose reference range applies only to samples taken after fasting for at least 8 hours.  I-STAT 7, (LYTES, BLD GAS, ICA, H+H)     Status: Abnormal   Collection Time: 01/01/21  2:30 PM  Result Value Ref Range   pH, Arterial 7.409 7.350 - 7.450   pCO2 arterial 32.0 32.0 - 48.0 mmHg   pO2, Arterial 153 (H) 83.0 - 108.0 mmHg   Bicarbonate 20.2 20.0 - 28.0 mmol/L   TCO2 21 (L) 22 - 32 mmol/L   O2 Saturation 99.0 %   Acid-base deficit 4.0 (H) 0.0 - 2.0 mmol/L   Sodium 130 (L) 135 - 145 mmol/L   Potassium 4.8 3.5 - 5.1 mmol/L   Calcium, Ion 1.08 (L) 1.15 - 1.40 mmol/L   HCT 22.0 (L) 39.0 - 52.0 %   Hemoglobin 7.5 (L) 13.0 - 17.0 g/dL   Collection site Radial    Drawn by RT    Sample type ARTERIAL    Glucose, capillary     Status: None   Collection Time: 01/01/21  3:29 PM  Result Value Ref Range   Glucose-Capillary 71 70 - 99 mg/dL    Comment: Glucose reference range applies only to samples taken after fasting for at least 8 hours.  Glucose, capillary     Status: Abnormal   Collection Time: 01/01/21  7:22 PM  Result Value Ref Range   Glucose-Capillary 109 (H) 70 - 99 mg/dL    Comment: Glucose reference range applies only to samples taken after fasting for at least 8 hours.  Glucose, capillary     Status: Abnormal   Collection Time: 01/01/21 11:36 PM  Result Value Ref Range   Glucose-Capillary 163 (H) 70 - 99 mg/dL    Comment: Glucose reference range applies only to samples taken after fasting for at least 8 hours.  Glucose, capillary     Status: Abnormal   Collection Time: 01/02/21  3:31 AM  Result Value Ref Range   Glucose-Capillary 137 (H) 70 - 99 mg/dL    Comment: Glucose reference range applies only to samples taken after fasting for at least 8 hours.  Renal function  panel     Status: Abnormal   Collection Time: 01/02/21  6:04 AM  Result Value Ref Range   Sodium 131 (L) 135 - 145 mmol/L   Potassium 4.0 3.5 - 5.1 mmol/L   Chloride 95 (L) 98 - 111 mmol/L   CO2 24 22 - 32 mmol/L   Glucose, Bld 131 (H) 70 - 99 mg/dL    Comment: Glucose reference range applies only to samples taken after fasting for at least 8 hours.   BUN 34 (H) 8 - 23 mg/dL   Creatinine, Ser 3.57 (H) 0.61 - 1.24 mg/dL   Calcium 8.5 (L) 8.9 - 10.3 mg/dL   Phosphorus 5.2 (H) 2.5 - 4.6 mg/dL   Albumin 1.9 (L) 3.5 - 5.0 g/dL   GFR, Estimated 18 (L) >60 mL/min    Comment: (NOTE) Calculated using the CKD-EPI Creatinine Equation (2021)    Anion gap 12 5 - 15    Comment: Performed at [facility] [address] [place], [place] [address]  CBC     Status: Abnormal   Collection Time: 01/02/21  6:04 AM  Result Value Ref Range   WBC 16.1 (H) 4.0 - 10.5 K/uL   RBC 2.51 (L) 4.22 - 5.81 MIL/uL    Hemoglobin 7.3 (L) 13.0 - 17.0 g/dL   HCT 22.4 (L) 39.0 - 52.0 %   MCV 89.2 80.0 - 100.0 fL   MCH 29.1 26.0 - 34.0 pg   MCHC 32.6 30.0 - 36.0 g/dL   RDW 16.7 (H) 11.5 - 15.5 %   Platelets 254 150 - 400 K/uL   nRBC 0.0 0.0 - 0.2 %    Comment: Performed at [facility] [address], [place], [place] [address]  APTT     Status: Abnormal   Collection Time: 01/02/21  6:04 AM  Result Value Ref Range   aPTT 76 (H) 24 - 36 seconds    Comment:        IF BASELINE aPTT IS ELEVATED, SUGGEST PATIENT RISK ASSESSMENT BE USED TO DETERMINE APPROPRIATE ANTICOAGULANT THERAPY. Performed at [facility], [place] [address], [place], [place] [address]   Heparin level (unfractionated)     Status: Abnormal   Collection Time: 01/02/21  6:06 AM  Result Value Ref Range   Heparin Unfractionated 0.85 (H) 0.30 - 0.70 IU/mL    Comment: (NOTE) The clinical reportable range upper limit is being lowered to >1.10 to align with the FDA approved guidance for the current laboratory assay.  If heparin results are below expected values, and patient dosage has  been confirmed, suggest follow up testing of antithrombin III levels. Performed at [facility], [place] [address], [place], [place] [address]   Glucose, capillary     Status: Abnormal   Collection Time: 01/02/21  7:59 AM  Result Value Ref Range   Glucose-Capillary 137 (H) 70 - 99 mg/dL    Comment: Glucose reference range applies only to samples taken after fasting for at least 8 hours.   Comment 1 Notify RN   Glucose, capillary     Status: Abnormal   Collection Time: 01/02/21 11:11 AM  Result Value Ref Range   Glucose-Capillary 116 (H) 70 - 99 mg/dL    Comment: Glucose reference range applies only to samples taken after fasting for at least 8 hours.    Portable Chest x-ray  Result Date: 01/01/2021 CLINICAL DATA:  Intubation. EXAM: PORTABLE CHEST 1 VIEW COMPARISON:  Chest x-ray dated December 27, 2020. FINDINGS: Patient is rotated to the  right, limiting evaluation. Tracheostomy tube is been removed and there is a new endotracheal tube with the tip 1.3 cm above the carina. Unchanged feeding tube entering the stomach with the tip below the field of view. Unchanged tunneled left internal jugular dialysis catheter and right chest wall port catheter. Stable cardiomediastinal silhouette. Normal pulmonary vascularity. Low lung volumes. No focal consolidation, pleural effusion, or pneumothorax. No acute osseous abnormality. IMPRESSION: 1. New endotracheal tube with the tip 1.3 cm above the carina. Consider retracting 1-2 cm. 2. No active disease. Electronically Signed   By: Titus Dubin M.D.   On: 01/01/2021 13:05    JKD:[id number] except as listed in admit H&P  Blood pressure 132/72, pulse (!) 103, temperature 98.5 F (36.9 C), temperature source Axillary, resp. rate 15, height 5\' 8"  (1.727 m), weight 105.3 kg, SpO2 98 %.  PHYSICAL EXAM: Overall appearance: Orally intubated and sedated Head:  Normocephalic, atraumatic. Ears: External a ears look healthy. Nose: External nose is healthy in appearance. Internal nasal exam free of any lesions or obstruction. Oral Cavity/Pharynx:  There are no mucosal lesions or masses identified. Larynx/Hypopharynx: Deferred Neuro: Sedated on ventilator. Neck: No palpable neck masses.  Tracheostomy dressing in place.  Studies Reviewed: none  Procedures: none   Assessment/Plan: Proceed with tracheostomy revision on Monday.  [name] 01/02/2021, 11:30 AM

## 2021-01-02 NOTE — Progress Notes (Signed)
OT Cancellation Note  Patient Details Name: Thomas [name] MRN: [id number] DOB: 22-Jun-1955   Cancelled Treatment:    Reason Eval/Treat Not Completed: Patient's level of consciousness. Sedated and on vent. OT to check back at a later date.   [name] OTR/L Supplemental OT, Department of rehab services [phone]  Thomas [name] R H. 01/02/2021, 1:44 PM

## 2021-01-02 NOTE — Plan of Care (Signed)

## 2021-01-02 NOTE — Progress Notes (Signed)
Nutrition Follow-up  DOCUMENTATION CODES:   Non-severe (moderate) malnutrition in context of chronic illness, Obesity unspecified  INTERVENTION:   Continue tube feeds via Cortrak: - Nepro @ 55 ml/hr (1320 ml/day) - ProSource TF 45 ml BID  Tube feeding regimen provides 2456 kcal, 129 grams of protein, and 950 ml of H2O.   - d/c MVI with minerals and order renal MVI daily  - Continue NutriSource fiber BID per tube  NUTRITION DIAGNOSIS:   Moderate Malnutrition related to chronic illness, cancer and cancer related treatments as evidenced by mild fat depletion, mild muscle depletion, percent weight loss.  Ongoing, being addressed via TF  GOAL:   Patient will meet greater than or equal to 90% of their needs  Met via TF  MONITOR:   Vent status, TF tolerance, Labs, Weight trends, Skin, I & O's  REASON FOR ASSESSMENT:   Consult Enteral/tube feeding initiation and management  ASSESSMENT:   65 y.o. male who is a former with medical history of stage 3 colon cancer s/p partial colectomy and end colostomy on 5/4, recent chemo start with 3rd cycle on 7/22, sleep apnea, seizures, epilepsy, brain tumor, HTN, HLD, GERD, asthma, peripheral neuropathy, urinary frequency, DM, MCA stroke. He presented to the ED via EMS due to abdominal pain x2 weeks, anorexia, and weakness. Admitted for close monitoring for concern of mesenteric ischemia. Patient reported that abdominal pain was worse with eating PTA.  7/31 - CRRT initiated 8/09 - CRRT stopped  8/10 - transfer to [facility] for trach placement and iHD 8/11 - s/p trach 8/12 - Cortrak placed (tip of tube in stomach) 8/15 - s/p placement of TDC, transitioned to trach collar 8/22 - pulled trach out, replaced under bronch guidance, s/p bronch revealing mild area of granulation tissue of top of trach tube 8/25 - pulled trach out, transferred to ICU, emergently intubated  Discussed pt with RN and during ICU rounds. Plan is to proceed with  tracheostomy revision on Monday with ENT. Palliative Medicine is involved in pt's care.  Last HD was on 8/25 with 3000 ml net UF.  Pt with significant diarrhea and on scheduled colace and miralax. MD okay with changing colace and miralax to PRN. Pt otherwise tolerating tube feeds without issue.  Admit weight: 111.8 kg Current weight: 105.3 kg  Pt with +2 pitting edema to BLE per nursing assessment.  Current TF: Nepro @ 55 ml/hr, ProSource TF 45 ml BID  Patient is currently intubated on ventilator support MV: 7.3 L/min Temp (24hrs), Avg:98.4 F (36.9 C), Min:97.9 F (36.6 C), Max:98.8 F (37.1 C)  Drips: Fentanyl Heparin  Medications reviewed and include: vitamin C 500 mg TID, colace, miralax, nutrisource BID, SSI q 4 hours, novolog 4 units q 4 hours, levemir 18 units BID, MVI with minerals daily, protonix, thiamine  Labs reviewed: sodium 131, BUN 34, creatinine 3.57, ionized calcium 1.08, phosphorus 5.2, hemoglobin 7.3 CBG's: 71-163 x 24 hours  Colostomy: 1125 ml x 24 hours I/O's: -11.5 L since admit  Diet Order:   Diet Order             Diet NPO time specified  Diet effective midnight                   EDUCATION NEEDS:   Not appropriate for education at this time  Skin:  Skin Assessment: Skin Integrity Issues: DTI: upper R and L face Stage II: coccyx, R back, L buttocks Other: sloughing to L hand due to hand-foot-and-mouth disease, R thigh  non-pressure friction wound  Last BM:  01/02/21 1125 ml x 24 hours via colostomy  Height:   Ht Readings from Last 1 Encounters:  01/01/21 5' 8"  (1.727 m)    Weight:   Wt Readings from Last 1 Encounters:  01/02/21 105.3 kg    Ideal Body Weight:  70 kg  BMI:  Body mass index is 35.3 kg/m.  Estimated Nutritional Needs:   Kcal:  2300-2500  Protein:  120-140 gm  Fluid:  1 L + UOP    Gustavus Bryant, MS, RD, LDN Inpatient Clinical Dietitian Please see AMiON for contact information.

## 2021-01-03 ENCOUNTER — Inpatient Hospital Stay (HOSPITAL_COMMUNITY): Payer: Medicare HMO

## 2021-01-03 DIAGNOSIS — E1100 Type 2 diabetes mellitus with hyperosmolarity without nonketotic hyperglycemic-hyperosmolar coma (NKHHC): Secondary | ICD-10-CM | POA: Diagnosis not present

## 2021-01-03 LAB — CBC WITH DIFFERENTIAL/PLATELET
Abs Immature Granulocytes: 0.4 10*3/uL — ABNORMAL HIGH (ref 0.00–0.07)
Basophils Absolute: 0.1 10*3/uL (ref 0.0–0.1)
Basophils Relative: 0 %
Eosinophils Absolute: 0.5 10*3/uL (ref 0.0–0.5)
Eosinophils Relative: 3 %
HCT: 20.4 % — ABNORMAL LOW (ref 39.0–52.0)
Hemoglobin: 6.9 g/dL — CL (ref 13.0–17.0)
Immature Granulocytes: 3 %
Lymphocytes Relative: 12 %
Lymphs Abs: 1.8 10*3/uL (ref 0.7–4.0)
MCH: 29.6 pg (ref 26.0–34.0)
MCHC: 33.8 g/dL (ref 30.0–36.0)
MCV: 87.6 fL (ref 80.0–100.0)
Monocytes Absolute: 1.7 10*3/uL — ABNORMAL HIGH (ref 0.1–1.0)
Monocytes Relative: 12 %
Neutro Abs: 10.5 10*3/uL — ABNORMAL HIGH (ref 1.7–7.7)
Neutrophils Relative %: 70 %
Platelets: 236 10*3/uL (ref 150–400)
RBC: 2.33 MIL/uL — ABNORMAL LOW (ref 4.22–5.81)
RDW: 16.8 % — ABNORMAL HIGH (ref 11.5–15.5)
WBC: 14.9 10*3/uL — ABNORMAL HIGH (ref 4.0–10.5)
nRBC: 0 % (ref 0.0–0.2)

## 2021-01-03 LAB — COMPREHENSIVE METABOLIC PANEL
ALT: 11 U/L (ref 0–44)
AST: 13 U/L — ABNORMAL LOW (ref 15–41)
Albumin: 1.8 g/dL — ABNORMAL LOW (ref 3.5–5.0)
Alkaline Phosphatase: 93 U/L (ref 38–126)
Anion gap: 13 (ref 5–15)
BUN: 48 mg/dL — ABNORMAL HIGH (ref 8–23)
CO2: 21 mmol/L — ABNORMAL LOW (ref 22–32)
Calcium: 8.7 mg/dL — ABNORMAL LOW (ref 8.9–10.3)
Chloride: 95 mmol/L — ABNORMAL LOW (ref 98–111)
Creatinine, Ser: 4.55 mg/dL — ABNORMAL HIGH (ref 0.61–1.24)
GFR, Estimated: 14 mL/min — ABNORMAL LOW (ref >60)
Glucose, Bld: 108 mg/dL — ABNORMAL HIGH (ref 70–99)
Potassium: 3.9 mmol/L (ref 3.5–5.1)
Sodium: 129 mmol/L — ABNORMAL LOW (ref 135–145)
Total Bilirubin: 0.7 mg/dL (ref 0.3–1.2)
Total Protein: 6.5 g/dL (ref 6.5–8.1)

## 2021-01-03 LAB — RENAL FUNCTION PANEL
Albumin: 1.6 g/dL — ABNORMAL LOW (ref 3.5–5.0)
Anion gap: 12 (ref 5–15)
BUN: 45 mg/dL — ABNORMAL HIGH (ref 8–23)
CO2: 20 mmol/L — ABNORMAL LOW (ref 22–32)
Calcium: 7.8 mg/dL — ABNORMAL LOW (ref 8.9–10.3)
Chloride: 99 mmol/L (ref 98–111)
Creatinine, Ser: 3.97 mg/dL — ABNORMAL HIGH (ref 0.61–1.24)
GFR, Estimated: 16 mL/min — ABNORMAL LOW (ref >60)
Glucose, Bld: 97 mg/dL (ref 70–99)
Phosphorus: 6 mg/dL — ABNORMAL HIGH (ref 2.5–4.6)
Potassium: 3.5 mmol/L (ref 3.5–5.1)
Sodium: 131 mmol/L — ABNORMAL LOW (ref 135–145)

## 2021-01-03 LAB — GLUCOSE, CAPILLARY
Glucose-Capillary: 100 mg/dL — ABNORMAL HIGH (ref 70–99)
Glucose-Capillary: 127 mg/dL — ABNORMAL HIGH (ref 70–99)
Glucose-Capillary: 132 mg/dL — ABNORMAL HIGH (ref 70–99)
Glucose-Capillary: 159 mg/dL — ABNORMAL HIGH (ref 70–99)
Glucose-Capillary: 92 mg/dL (ref 70–99)
Glucose-Capillary: 98 mg/dL (ref 70–99)

## 2021-01-03 LAB — APTT
aPTT: 149 seconds — ABNORMAL HIGH (ref 24–36)
aPTT: 110 seconds — ABNORMAL HIGH (ref 24–36)

## 2021-01-03 LAB — PREPARE RBC (CROSSMATCH)

## 2021-01-03 LAB — MRSA NEXT GEN BY PCR, NASAL: MRSA by PCR Next Gen: NOT DETECTED

## 2021-01-03 LAB — MAGNESIUM: Magnesium: 1.5 mg/dL — ABNORMAL LOW (ref 1.7–2.4)

## 2021-01-03 LAB — HEPARIN LEVEL (UNFRACTIONATED)
Heparin Unfractionated: 0.44 IU/mL (ref 0.30–0.70)
Heparin Unfractionated: 0.43 IU/mL (ref 0.30–0.70)
Heparin Unfractionated: 0.25 IU/mL — ABNORMAL LOW (ref 0.30–0.70)

## 2021-01-03 MED ORDER — SODIUM CHLORIDE 0.9% IV SOLUTION: Freq: Once | INTRAVENOUS | Status: DC

## 2021-01-03 NOTE — Progress Notes (Signed)
ANTICOAGULATION CONSULT NOTE - Follow Consult  Pharmacy Consult for: heparin infusion Indication: RLE DVT 8/5  No Known Allergies  Patient Measurements: Height: 5\' 8"  (172.7 cm) Weight: 107.5 kg (236 lb 15.9 oz) IBW/kg (Calculated) : 68.4 Heparin Dosing Weight: 92 kg  Vital Signs: Temp: 98.7 F (37.1 C) (08/27 0540) Temp Source: Axillary (08/27 0540) BP: 143/73 (08/27 0700) Pulse Rate: 96 (08/27 0700)  Labs: Recent Labs    01/01/21 0410 01/01/21 1430 01/02/21 0604 01/02/21 0606 01/02/21 1411 01/03/21 0129  HGB 7.4* 7.5* 7.3*  --   --  6.9*  HCT 21.8* 22.0* 22.4*  --   --  20.4*  PLT 248  --  254  --   --  236  APTT  --   --  76*  --  86* 149*  HEPARINUNFRC  --   --   --  0.85*  --  0.44  CREATININE 5.22*  --  3.57*  --   --  4.55*  3.97*     Estimated Creatinine Clearance: 22.3 mL/min (A) (by C-G formula based on SCr of 3.97 mg/dL (H)).   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   Dizziness    Dyspnea    pt states d/t weight   Epilepsy (HCC)    takes Keppra daily   GERD (gastroesophageal reflux disease)    takes Omeprazole daily   Headache    Hyperlipidemia    takes Atorvastatin daily   Hypertension    takes Lotrel daily   Peripheral edema    takes Lasix daily   Peripheral neuropathy    takes Gabapentin daily   Pneumonia 3 yrs ago   hx of   Seizures (HCC)    Sleep apnea    Urinary frequency     Medications:  Infusions:   sodium chloride Stopped (12/20/20 0955)   feeding supplement (NEPRO CARB STEADY) 1,000 mL (01/02/21 1040)   fentaNYL infusion INTRAVENOUS 200 mcg/hr (01/03/21 0557)   heparin 1,250 Units/hr (01/03/21  0600)    HPI/CC:  65 yo M presenting with potentially sepsis/ chemotherapy reaction.    Assessment: Transitioned from CRRT to [facility]. Diagnosed with RLE DVT on 8/5. Started on apixaban 5mg  BID. Last dose 8/25 AM.  Heparin drip started in the meantime in anticipation of trach placement.   On heparin with aPTT therapeutic x2 yesterday. Today, heparin level 0.44 and aPTT 149 on heparin 1250 units/hr which is questionable since heparin level is therapeutic and aPTT would be supratherapeutic. Rechecked aPTT and heparin level given results/questionable trend - 110 and 0.43 respectively. Last iHD on 8/25. Heparin levels are known to be more accurate than aPTT. Will dose based on heparin level. Hgb 6.9 - transfusing. Per RN, no reported bleeding   Goal of Therapy:  Heparin level 0.3-0.7 units/ml Monitor platelets by anticoagulation protocol: Yes   Plan:  Decrease heparin to 1200 units/hr given drop in hemoglobin Check 8 hr heparin level  Stop checking aPTT levels Monitor heparin level, CBC and s/s of bleeding daily  Follow up transition back to oral anticoagulant   Thank you for allowing pharmacy  to participate in this patient's care.  Cristela Felt, PharmD, BCPS Clinical Pharmacist 01/03/2021 7:11 AM

## 2021-01-03 NOTE — Progress Notes (Signed)
ANTICOAGULATION CONSULT NOTE - Follow Consult  Pharmacy Consult for: heparin infusion Indication: RLE DVT 8/5  No Known Allergies  Patient Measurements: Height: 5\' 8"  (172.7 cm) Weight: 105.6 kg (232 lb 12.9 oz) IBW/kg (Calculated) : 68.4 Heparin Dosing Weight: 92 kg  Vital Signs: Temp: 99.2 F (37.3 C) (08/27 1935) Temp Source: Axillary (08/27 1935) BP: 123/109 (08/27 1930) Pulse Rate: 103 (08/27 1930)  Labs: Recent Labs    01/01/21 0410 01/01/21 1430 01/01/21 1430 01/02/21 0604 01/02/21 0606 01/02/21 1411 01/03/21 0129 01/03/21 1015 01/03/21 2054  HGB 7.4* 7.5*  --  7.3*  --   --  6.9*  --   --   HCT 21.8* 22.0*  --  22.4*  --   --  20.4*  --   --   PLT 248  --   --  254  --   --  236  --   --   APTT  --   --    < > 76*  --  86* 149* 110*  --   HEPARINUNFRC  --   --   --   --    < >  --  0.44 0.43 0.25*  CREATININE 5.22*  --   --  3.57*  --   --  4.55*  3.97*  --   --    < > = values in this interval not displayed.     Estimated Creatinine Clearance: 22.1 mL/min (A) (by C-G formula based on SCr of 3.97 mg/dL (H)).   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   Dizziness    Dyspnea    pt states d/t weight   Epilepsy (HCC)    takes Keppra daily   GERD (gastroesophageal reflux disease)    takes Omeprazole daily   Headache    Hyperlipidemia    takes Atorvastatin daily   Hypertension    takes Lotrel daily   Peripheral edema    takes Lasix daily   Peripheral neuropathy    takes Gabapentin daily   Pneumonia 3 yrs ago   hx of   Seizures (HCC)    Sleep apnea    Urinary frequency     Medications:  Infusions:   sodium chloride  Stopped (12/20/20 0955)   feeding supplement (NEPRO CARB STEADY) 1,000 mL (01/03/21 1014)   fentaNYL infusion INTRAVENOUS 200 mcg/hr (01/03/21 2024)   heparin 1,200 Units/hr (01/03/21 1800)    HPI/CC:  65 yo M presenting with potentially sepsis/ chemotherapy reaction.    Assessment: Transitioned from CRRT to [facility]. Diagnosed with RLE DVT on 8/5. Started on apixaban 5mg  BID. Last dose 8/25 AM.  Heparin drip started in the meantime in anticipation of trach placement.   Heparin level subtherapeutic this evening after rate adjustment.  Goal of Therapy:  Heparin level 0.3-0.7 units/ml Monitor platelets by anticoagulation protocol: Yes   Plan:  Increase heparin back to 1250 units/h Daily HL CBC  Arrie Senate, PharmD, [place], [facility] Clinical Pharmacist [phone] Please check AMION for all [place] numbers 01/03/2021

## 2021-01-03 NOTE — Progress Notes (Signed)
eLink Physician-Brief Progress Note Patient Name: Thomas [name] DOB: January 02, 1956 MRN: [id number]   Date of Service  01/03/2021  HPI/Events of Note  Hemoglobin 6.9 gm / dl. No obvious bleeding focus but hemoglobin was marginal at baseline.  eICU Interventions  Order entered to transfuse 1 unit of PRBC.        Petronella Shuford U Taylon Coole 01/03/2021, 2:30 AM

## 2021-01-03 NOTE — Progress Notes (Addendum)
NAME:  Thomas [name], MRN:  [id number], DOB:  [birth year], LOS: 20 ADMISSION DATE:  11/30/2020, CONSULTATION DATE:  7/30 REFERRING MD:  Riccardo Dubin, CHIEF COMPLAINT:  Worsening hypoxia/dyspnea  History of Present Illness:  65 yo M admitted for worsening sepsis vs reaction to chemotherapy with high ostomy output and soft BP on 7/25. Has had issues during his hospitalization with recurrent aspirations and more recently worsening respiratory status.  Most recently pt developed worsening hypoxia and was requiring BIPAP w/ FIO2 of 100% and increased work of breathing.   Who was admitted to intensive care and subsequently had worsening clinical status requiring intubation, CRRT, multiple pressors and pneumonia along with CT findings 8/1 concerning for ischemic bowel.   Significant Hospital Events:  7/27 Transferred to [facility], stopped abx as sepsis resolved/ruled out 7/30 AM Worsened AME, more hypoxic with worsening renal funciton, new aspiration on abx restarted 7/30 PM Pt with increased o2 requirement on 100% FIO2 on BIPAP, d/w family who wanted to proceed with intubation, pt intubated 8/1 Some issues with tachycardia and hypotension overnight. Reproted high ostomy output  8/2 down to 9 mcg of Levophed, however repeat CT abdomen/pelvis showed inflammatory wall thickening and fat stranding of the descending and sigmoid colon, new since prior, consistent with nonspecific infectious, inflammatory, or ischemic colitis.  Also new dense consolidative airspace opacity in the right lower and right middle lobes 8/3 further weaning NE. Weaning sedation. Full code, full scope per family meeting 8/2  8/4 off pressors, plt down to 103.  8/5 remains off pressors. Moving around more, not really following commands though  8/9-failed extubation 8/8, tolerated nasal cannula for about an hour, desaturations and inability to clear secretions-reintubated 8/8 8/10 some agitation overnight 8/11 tracheostomy placement On trach  collar since 8/16 8/22 pulled trach out, replaced under bronch guidance. Has proximal area of granulation tissue that is above the bottom of the trach tube.  8/25 Removed Trach, unable to reinsert, despite multiple attempts reintubated  Micro: Resp viral panel 7/24 > neg covid/ flu BC  7/24  x 2 neg  MRSA  7/25 neg  UC   7/25  neg  C diff screeen 7/26  neg ET culture 7/31 > moderate yeast> grew mod candida tropicalis GI panel  8/1> neg   Abx: Unasyn 7/30 >>7/31 Maxepime 7/31 >> 8/7, 8/17> 8/21 Flagyl 7/31 >>  8/7 Vancomycin 8/1 only  Anidulafungin 8/1>>8/3  Interim History / Subjective:   No acute event overnight. 1 unit PRBC given for low Hb. No active bleed  Objective   Blood pressure 137/74, pulse 90, temperature 98.1 F (36.7 C), temperature source Oral, resp. rate 15, height 5\' 8"  (1.727 m), weight 107.5 kg, SpO2 98 %.    Vent Mode: PRVC FiO2 (%):  [30 %] 30 % Set Rate:  [15 bmp] 15 bmp Vt Set:  [540 mL] 540 mL PEEP:  [5 cmH20] 5 cmH20 Plateau Pressure:  [14 cmH20-21 cmH20] 14 cmH20   Intake/Output Summary (Last 24 hours) at 01/03/2021 0932 Last data filed at 01/03/2021 0600 Gross per 24 hour  Intake 1880.11 ml  Output 650 ml  Net 1230.11 ml   Filed Weights   01/01/21 1730 01/02/21 0500 01/03/21 0500  Weight: 106 kg 105.3 kg 107.5 kg     Physical exam: Gen:      No acute distress, chronically ill appearing HEENT:  EOMI, sclera anicteric Neck:     No masses; no thyromegaly, ETT Lungs:    Clear to auscultation bilaterally; normal  respiratory effort CV:         Regular rate and rhythm; no murmurs Abd:      + bowel sounds; soft, non-tender; no palpable masses, no distension Ext:    No edema; adequate peripheral perfusion Skin:      Warm and dry; no rash Neuro: Somnolent, responsive  Labs reviewed reviewed Significant for sodium 129, creatinine 4.55, WBC 14.9, hemoglobin 6.9 Chest x-ray 8/27 with decreased lung volume, right segmental atelectasis  Resolved  Hospital Problem list   Shock  HHS Afib RVR  Lactic Acidosis  Thrombocytopenia colitis  Assessment & Plan:   Acute hypoxemic respiratory failure s/p tracheostomy on 8/11 Aspiration pneumonia Endotracheal granulation tissue at tracheostomy site; superior to distal end of trach so not occlusive with trach in Patient had failed extubation 8/8 secondary to increased secretions. Tracheostomy performed 8/11 due to excessive secretions, due to difficult vent wean.  01/01/2021 patient removed his tracheostomy replaced at bedside transferred to intensive care unit intubated from above. Continue vent support For trach revision on Monday Follow intermittent chest x-ray  Renal insufficiency HD today  Hyponatremia Plan Of free water, give normal saline bolus Trend BMET  Diarrhea Contributing to hyponatremia 1126 of stool out last 24 hours Plan Imodium prn Fluid repletion as needed  Colorectal cancer Per oncology  LE DVT On heparin drip  The patient is critically ill with multiple organ system failure and requires high complexity decision making for assessment and support, frequent evaluation and titration of therapies, advanced monitoring, review of radiographic studies and interpretation of complex data.   Critical Care Time devoted to patient care services, exclusive of separately billable procedures, described in this note is 35 minutes.   Marshell Garfinkel MD [facility] See Amion for pager  If no response to pager , please call [phone] until 7pm After 7:00 pm call [place]  [phone] 01/03/2021, 9:37 AM

## 2021-01-03 NOTE — Progress Notes (Signed)
Bladder scan revealed less than 89cc

## 2021-01-03 NOTE — Progress Notes (Signed)
   01/03/21 0746  Airway 8 mm  Placement Date/Time: 01/01/21 1231   Placed By: ICU physician  Laryngoscope Blade: 4  ETT Types: Oral  Size (mm): 8 mm  Cuffed: Cuffed  Airway Equipment: Video Laryngoscope  Placement Confirmation: Direct Visualization;Bilateral Breath Sounds;CXR Conf...  Secured at (cm) 24 cm  Measured From Lips  Secured Location Left  Secured By Actuary Repositioned Yes  Prone position No  Cuff Pressure (cm H2O) Green OR 18-26 CmH2O  Site Condition Dry  Adult Ventilator Settings  Vent Type Servo i  Humidity HME  Vent Mode PRVC  Vt Set 540 mL  Set Rate 15 bmp  I Time 0.9 Sec(s)  PEEP 5 cmH20  Adult Ventilator Measurements  Peak Airway Pressure 24 L/min  Mean Airway Pressure 11 cmH20  Plateau Pressure 14 cmH20  Resp Rate Spontaneous 0 br/min  Resp Rate Total 15 br/min  Exhaled Vt 530 mL  Measured Ve 7.6 mL  I:E Ratio Measured 1:3.4  Auto PEEP 0 cmH20  Total PEEP 5 cmH20  SpO2 95 %  Adult Ventilator Alarms  Alarms On Y  Ve High Alarm 24 L/min  Ve Low Alarm 4 L/min  Resp Rate High Alarm 42 br/min  Resp Rate Low Alarm 8  PEEP Low Alarm 3 cmH2O  Press High Alarm 45 cmH2O  VAP Prevention  HME changed No  Ventilator changed No  Transported while on vent No  HOB> 30 Degrees Y  Equipment wiped down Yes  Daily Weaning Assessment  Daily Assessment of Readiness to Wean Wean protocol criteria met (SBT performed)  SBT Method CPAP 5 cm H20 and PS 5 cm H20  Weaning Start Time 0735  Patient response Failed SBT terminated  Reason SBT Terminated RR > 35 breaths/min or Frequency/TV ratio >105;Excessive agitation, marked accessory muscle use, abdominal paradox, or diaphoresis noted  Breath Sounds  Bilateral Breath Sounds Diminished  Airway Suctioning/Secretions  Suction Type ETT  Suction Device  Catheter  Secretion Amount Small  Secretion Color White  Secretion Consistency Thick  Suction Tolerance Tolerated well  Suctioning Adverse  Effects None

## 2021-01-03 NOTE — Progress Notes (Signed)
[facility] NEPHROLOGY PROGRESS NOTE  Assessment/ Plan:  # Dialysis dependent AKI/CKD stage IIIb - ischemic ATN in setting of severe sepsis and multisystem organ failure.  Admission creatinine was 2.1 and earlier on CRRT (stopped after 8/9) and transitioned to IHD-underwent left IJ TDC placement on 8/15.  Now oliguric at best.  Noted palliative care team has seen patient during this hospitalization. Family desires to continue current full scope of treatment.  Cont HD on THS Schedule: 2K, TDC, no heparin, 3 hours, 2 to 3 L UF    # Altered mental status-with prior history of CVA in 2015 and seizure disorder.  Per TRH  # HTN/Vol: BPs stable. Weights stable. UF as able   # Sepsis/aspiration pneumonia -resolved. Per TRH  # Chronic hypoxic respiratory failure -status post tracheostomy patient removed tracheostomy 8/25 requiring intubation, plan per CCM/ENT  # Stage IIIB colon cancer - sp partial colectomy, colostomy. On chemoRx sp 3 cycles.  Oncology has been following along peripherally  # Right leg DVT - anticoagulation per primary team.  # Atrial fibrillation - anticoaguolation per primary, rate controlled.  # Anemia of critical illness: Transfuse as needed. holding erythropoietin with active malignancy.  #CKD-BMD: Intermittent hyperphosphatemia, trend.  Calcium stable.  Subjective:  No interval event For HD today VSS  Objective Vital signs in last 24 hours: Vitals:   01/03/21 0700 01/03/21 0746 01/03/21 0747 01/03/21 0800  BP: (!) 143/73   137/74  Pulse: 96   90  Resp: 10   15  Temp:   98.1 F (36.7 C)   TempSrc:   Oral   SpO2: 94% 95%  98%  Weight:      Height:       Weight change: 1.5 kg  Intake/Output Summary (Last 24 hours) at 01/03/2021 1054 Last data filed at 01/03/2021 0600 Gross per 24 hour  Intake 1802.58 ml  Output 650 ml  Net 1152.58 ml        Labs: Basic Metabolic Panel: Recent Labs  Lab 01/01/21 0410 01/01/21 1430 01/02/21 0604  01/03/21 0129  NA 129* 130* 131* 129*  131*  K 4.9 4.8 4.0 3.9  3.5  CL 94*  --  95* 95*  99  CO2 21*  --  24 21*  20*  GLUCOSE 110*  --  131* 108*  97  BUN 59*  --  34* 48*  45*  CREATININE 5.22*  --  3.57* 4.55*  3.97*  CALCIUM 8.5*  --  8.5* 8.7*  7.8*  PHOS 7.4*  --  5.2* 6.0*    Liver Function Tests: Recent Labs  Lab 01/01/21 0410 01/02/21 0604 01/03/21 0129  AST  --   --  13*  ALT  --   --  11  ALKPHOS  --   --  93  BILITOT  --   --  0.7  PROT  --   --  6.5  ALBUMIN 1.7* 1.9* 1.8*  1.6*    No results for input(s): LIPASE, AMYLASE in the last 168 hours. No results for input(s): AMMONIA in the last 168 hours. CBC: Recent Labs  Lab 12/29/20 0341 12/31/20 0431 01/01/21 0410 01/01/21 1430 01/02/21 0604 01/03/21 0129  WBC 13.0* 12.4* 13.8*  --  16.1* 14.9*  NEUTROABS  --   --   --   --   --  10.5*  HGB 8.1* 7.2* 7.4* 7.5* 7.3* 6.9*  HCT 24.2* 21.9* 21.8* 22.0* 22.4* 20.4*  MCV 88.3 89.4 89.3  --  89.2  87.6  PLT 180 208 248  --  254 236    Cardiac Enzymes: No results for input(s): CKTOTAL, CKMB, CKMBINDEX, TROPONINI in the last 168 hours. CBG: Recent Labs  Lab 01/02/21 1531 01/02/21 1916 01/02/21 2300 01/03/21 0321 01/03/21 0720  GLUCAP 154* 118* 121* 98 127*     Iron Studies: No results for input(s): IRON, TIBC, TRANSFERRIN, FERRITIN in the last 72 hours. Studies/Results: DG CHEST PORT 1 VIEW  Result Date: 01/03/2021 CLINICAL DATA:  Respiratory failure. EXAM: PORTABLE CHEST 1 VIEW COMPARISON:  01/01/2021 FINDINGS: ET tube tip is stable above the carina. There is a feeding tube with tip below the field of view. Left chest wall dialysis catheter projects over the cavoatrial junction. Right porta catheter is noted with tip also in the expected location of the cavoatrial junction. Stable cardiomediastinal contours. Decreased lung volumes with subsegmental atelectasis in the right base. IMPRESSION: 1. Stable support apparatus. 2. Decreased lung  volumes with right base subsegmental atelectasis. Electronically Signed   By: Kerby Moors M.D.   On: 01/03/2021 08:39   Portable Chest x-ray  Result Date: 01/01/2021 CLINICAL DATA:  Intubation. EXAM: PORTABLE CHEST 1 VIEW COMPARISON:  Chest x-ray dated December 27, 2020. FINDINGS: Patient is rotated to the right, limiting evaluation. Tracheostomy tube is been removed and there is a new endotracheal tube with the tip 1.3 cm above the carina. Unchanged feeding tube entering the stomach with the tip below the field of view. Unchanged tunneled left internal jugular dialysis catheter and right chest wall port catheter. Stable cardiomediastinal silhouette. Normal pulmonary vascularity. Low lung volumes. No focal consolidation, pleural effusion, or pneumothorax. No acute osseous abnormality. IMPRESSION: 1. New endotracheal tube with the tip 1.3 cm above the carina. Consider retracting 1-2 cm. 2. No active disease. Electronically Signed   By: Titus Dubin M.D.   On: 01/01/2021 13:05    Medications: Infusions:  sodium chloride Stopped (12/20/20 0955)   feeding supplement (NEPRO CARB STEADY) 1,000 mL (01/03/21 1014)   fentaNYL infusion INTRAVENOUS 200 mcg/hr (01/03/21 0714)   heparin 1,250 Units/hr (01/03/21 0600)    Scheduled Medications:  sodium chloride   Intravenous Once   acetaminophen  1,000 mg Per Tube Q8H   vitamin C  500 mg Per Tube TID   atorvastatin  20 mg Per Tube Daily   chlorhexidine gluconate (MEDLINE KIT)  15 mL Mouth Rinse BID   Chlorhexidine Gluconate Cloth  6 each Topical Daily   clonazepam  1 mg Per Tube Q6H   feeding supplement (PROSource TF)  45 mL Per Tube BID   fiber  1 packet Per Tube BID   glycopyrrolate  1 mg Per Tube BID   hydrocerin   Topical BID   insulin aspart  0-15 Units Subcutaneous Q4H   insulin aspart  4 Units Subcutaneous Q4H   insulin detemir  18 Units Subcutaneous BID   levETIRAcetam  500 mg Per Tube BID   mouth rinse  15 mL Mouth Rinse 10 times per day    multivitamin  1 tablet Per Tube QHS   pantoprazole sodium  40 mg Per Tube BID   QUEtiapine  100 mg Per Tube BID   thiamine  100 mg Per Tube Daily    have reviewed scheduled and prn medications.  Physical Exam: General: Critically ill looking male, intubated and sedated Heart:RRR, s1s2 nl Lungs: Coarse breath sounds anteriorly Abdomen:soft, Non-tender, non-distended Extremities: Trace b/l LE edema  with chronic venous stasis change. Dialysis Access: [facility] in place.   B  01/03/2021,10:54 AM  LOS: 33 days

## 2021-01-04 DIAGNOSIS — E1100 Type 2 diabetes mellitus with hyperosmolarity without nonketotic hyperglycemic-hyperosmolar coma (NKHHC): Secondary | ICD-10-CM | POA: Diagnosis not present

## 2021-01-04 LAB — RENAL FUNCTION PANEL
Albumin: 1.9 g/dL — ABNORMAL LOW (ref 3.5–5.0)
Anion gap: 15 (ref 5–15)
BUN: 23 mg/dL (ref 8–23)
CO2: 24 mmol/L (ref 22–32)
Calcium: 8.6 mg/dL — ABNORMAL LOW (ref 8.9–10.3)
Chloride: 93 mmol/L — ABNORMAL LOW (ref 98–111)
Creatinine, Ser: 2.71 mg/dL — ABNORMAL HIGH (ref 0.61–1.24)
GFR, Estimated: 25 mL/min — ABNORMAL LOW (ref >60)
Glucose, Bld: 128 mg/dL — ABNORMAL HIGH (ref 70–99)
Phosphorus: 4.3 mg/dL (ref 2.5–4.6)
Potassium: 3.3 mmol/L — ABNORMAL LOW (ref 3.5–5.1)
Sodium: 132 mmol/L — ABNORMAL LOW (ref 135–145)

## 2021-01-04 LAB — GLUCOSE, CAPILLARY
Glucose-Capillary: 100 mg/dL — ABNORMAL HIGH (ref 70–99)
Glucose-Capillary: 111 mg/dL — ABNORMAL HIGH (ref 70–99)
Glucose-Capillary: 116 mg/dL — ABNORMAL HIGH (ref 70–99)
Glucose-Capillary: 120 mg/dL — ABNORMAL HIGH (ref 70–99)
Glucose-Capillary: 144 mg/dL — ABNORMAL HIGH (ref 70–99)
Glucose-Capillary: 98 mg/dL (ref 70–99)

## 2021-01-04 LAB — CBC
HCT: 24.9 % — ABNORMAL LOW (ref 39.0–52.0)
Hemoglobin: 8.4 g/dL — ABNORMAL LOW (ref 13.0–17.0)
MCH: 29 pg (ref 26.0–34.0)
MCHC: 33.7 g/dL (ref 30.0–36.0)
MCV: 85.9 fL (ref 80.0–100.0)
Platelets: 250 10*3/uL (ref 150–400)
RBC: 2.9 MIL/uL — ABNORMAL LOW (ref 4.22–5.81)
RDW: 17.6 % — ABNORMAL HIGH (ref 11.5–15.5)
WBC: 13.9 10*3/uL — ABNORMAL HIGH (ref 4.0–10.5)
nRBC: 0 % (ref 0.0–0.2)

## 2021-01-04 LAB — TYPE AND SCREEN
ABO/RH(D): B POS
Antibody Screen: NEGATIVE
Unit division: 0

## 2021-01-04 LAB — BPAM RBC
Blood Product Expiration Date: 202209192359
ISSUE DATE / TIME: 202208270509
Unit Type and Rh: 7300

## 2021-01-04 LAB — HEPARIN LEVEL (UNFRACTIONATED)
Heparin Unfractionated: 0.4 IU/mL (ref 0.30–0.70)
Heparin Unfractionated: 0.32 IU/mL (ref 0.30–0.70)

## 2021-01-04 LAB — MAGNESIUM: Magnesium: 1.7 mg/dL (ref 1.7–2.4)

## 2021-01-04 MED ORDER — MAGNESIUM SULFATE IN D5W 1-5 GM/100ML-% IV SOLN
1.0000 g | Freq: Once | INTRAVENOUS | Status: AC
  Administered 2021-01-04: 1 g via INTRAVENOUS
  Filled 2021-01-04: qty 100

## 2021-01-04 MED ORDER — INSULIN DETEMIR 100 UNIT/ML ~~LOC~~ SOLN
15.0000 [IU] | Freq: Two times a day (BID) | SUBCUTANEOUS | Status: DC
  Administered 2021-01-05 – 2021-01-29 (×48): 15 [IU] via SUBCUTANEOUS
  Filled 2021-01-04 (×50): qty 0.15

## 2021-01-04 MED ORDER — POTASSIUM CHLORIDE 10 MEQ/100ML IV SOLN
10.0000 meq | INTRAVENOUS | Status: AC
  Administered 2021-01-04 (×2): 10 meq via INTRAVENOUS
  Filled 2021-01-04 (×2): qty 100

## 2021-01-04 MED ORDER — SODIUM CHLORIDE 0.9 % IV BOLUS
500.0000 mL | Freq: Once | INTRAVENOUS | Status: AC
  Administered 2021-01-04: 500 mL via INTRAVENOUS

## 2021-01-04 NOTE — Progress Notes (Signed)
eLink Physician-Brief Progress Note Patient Name: Thomas [name] DOB: 06/20/1955 MRN: [id number]   Date of Service  01/04/2021  HPI/Events of Note  labs back with K+ 3.3, mag 1.7     ESRD, S/P HD yesterday  on vent with FT and PIV with port a cath  eICU Interventions  Mag 1 gm and Kcl 10 meq q1 hr x 2 doses ordered.      Intervention Category Intermediate Interventions: Electrolyte abnormality - evaluation and management  Elmer Sow 01/04/2021, 1:28 AM

## 2021-01-04 NOTE — Progress Notes (Addendum)
ANTICOAGULATION CONSULT NOTE - Follow Consult  Pharmacy Consult for: heparin infusion Indication: RLE DVT 8/5  No Known Allergies  Patient Measurements: Height: 5\' 8"  (172.7 cm) Weight: 104.6 kg (230 lb 9.6 oz) IBW/kg (Calculated) : 68.4 Heparin Dosing Weight: 92 kg  Vital Signs: Temp: 98.6 F (37 C) (08/28 0744) Temp Source: Axillary (08/28 0744) BP: 131/78 (08/28 0400) Pulse Rate: 92 (08/28 0400)  Labs: Recent Labs    01/02/21 0604 01/02/21 0606 01/02/21 1411 01/03/21 0129 01/03/21 1015 01/03/21 2054 01/04/21 0016 01/04/21 0706  HGB 7.3*  --   --  6.9*  --   --  8.4*  --   HCT 22.4*  --   --  20.4*  --   --  24.9*  --   PLT 254  --   --  236  --   --  250  --   APTT 76*  --  86* 149* 110*  --   --   --   HEPARINUNFRC  --    < >  --  0.44 0.43 0.25*  --  0.40  CREATININE 3.57*  --   --  4.55*  3.97*  --   --  2.71*  --    < > = values in this interval not displayed.     Estimated Creatinine Clearance: 32.3 mL/min (A) (by C-G formula based on SCr of 2.71 mg/dL (H)).   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   Dizziness    Dyspnea    pt states d/t weight   Epilepsy (HCC)    takes Keppra daily   GERD (gastroesophageal reflux disease)    takes Omeprazole daily   Headache    Hyperlipidemia    takes Atorvastatin daily   Hypertension    takes Lotrel daily   Peripheral edema    takes Lasix daily   Peripheral neuropathy    takes Gabapentin daily   Pneumonia 3 yrs ago   hx of   Seizures (HCC)    Sleep apnea    Urinary frequency     Medications:  Infusions:   sodium chloride Stopped (12/20/20 0955)   feeding supplement (NEPRO  CARB STEADY) 1,000 mL (01/04/21 1601)   fentaNYL infusion INTRAVENOUS 200 mcg/hr (01/04/21 0603)   heparin 1,250 Units/hr (01/03/21 2150)    HPI/CC:  65 yo M presenting with potentially sepsis/ chemotherapy reaction.    Assessment: Transitioned from CRRT to [facility]. Diagnosed with RLE DVT on 8/5. Started on apixaban 5mg  BID. Last dose 8/25 AM.  Heparin drip started in the meantime in anticipation of trach placement on Mon 8/29.   8/28 AM HL 0.4 units/mL Hgb 6.9>8.4, plts 250  Goal of Therapy:  Heparin level 0.3-0.7 units/ml Monitor platelets by anticoagulation protocol: Yes   Plan:  Continue heparin at 1250 units/hr Plan to obtain HL at 1330 Monitor heparin level, CBC, and s/sx of bleeding daily Follow up transition back to [place] post trach placement Planning to hold heparin drip at midnight for procedure tomorrow  Donnald Garre, PharmD Clinical Pharmacist  Please check AMION for all [place] numbers After  10:00 PM, call [place] [phone]

## 2021-01-04 NOTE — Progress Notes (Signed)
ANTICOAGULATION CONSULT NOTE - Follow Consult  Pharmacy Consult for: heparin infusion Indication: RLE DVT 8/5  No Known Allergies  Patient Measurements: Height: 5\' 8"  (172.7 cm) Weight: 104.6 kg (230 lb 9.6 oz) IBW/kg (Calculated) : 68.4 Heparin Dosing Weight: 92 kg  Vital Signs: Temp: 99.1 F (37.3 C) (08/28 1142) Temp Source: Oral (08/28 1142) BP: 138/72 (08/28 1400) Pulse Rate: 97 (08/28 1400)  Labs: Recent Labs    01/02/21 0604 01/02/21 0606 01/02/21 1411 01/03/21 0129 01/03/21 1015 01/03/21 2054 01/04/21 0016 01/04/21 0706 01/04/21 1458  HGB 7.3*  --   --  6.9*  --   --  8.4*  --   --   HCT 22.4*  --   --  20.4*  --   --  24.9*  --   --   PLT 254  --   --  236  --   --  250  --   --   APTT 76*  --  86* 149* 110*  --   --   --   --   HEPARINUNFRC  --    < >  --  0.44 0.43 0.25*  --  0.40 0.32  CREATININE 3.57*  --   --  4.55*  3.97*  --   --  2.71*  --   --    < > = values in this interval not displayed.     Estimated Creatinine Clearance: 32.3 mL/min (A) (by C-G formula based on SCr of 2.71 mg/dL (H)).   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   Dizziness    Dyspnea    pt states d/t weight   Epilepsy (HCC)    takes Keppra daily   GERD (gastroesophageal reflux disease)    takes Omeprazole daily   Headache    Hyperlipidemia    takes Atorvastatin daily   Hypertension    takes Lotrel daily   Peripheral edema    takes Lasix daily   Peripheral neuropathy    takes Gabapentin daily   Pneumonia 3 yrs ago   hx of   Seizures (HCC)    Sleep apnea    Urinary frequency     Medications:  Infusions:   sodium chloride Stopped  (12/20/20 0955)   feeding supplement (NEPRO CARB STEADY) 1,000 mL (01/04/21 8338)   fentaNYL infusion INTRAVENOUS 200 mcg/hr (01/04/21 0603)   heparin 1,250 Units/hr (01/04/21 1531)    HPI/CC:  65 yo M presenting with potentially sepsis/ chemotherapy reaction.    Assessment: Transitioned from CRRT to [facility]. Diagnosed with RLE DVT on 8/5. Started on apixaban 5mg  BID. Last dose 8/25 AM.  Heparin drip started in the meantime in anticipation of trach placement on Mon 8/29.   Heparin level therapeutic at 0.32, planning to hold at midnight for trach revision.  Goal of Therapy:  Heparin level 0.3-0.7 units/ml Monitor platelets by anticoagulation protocol: Yes   Plan:  Continue heparin at 1250 units/hr until tonight at 0000 F/U resuming [facility] tomorrow  Arrie Senate, PharmD, BCPS, [facility] Clinical Pharmacist [phone] Please check AMION for all [place] numbers 01/04/2021

## 2021-01-04 NOTE — Progress Notes (Signed)
   01/04/21 0753  Airway 8 mm  Placement Date/Time: 01/01/21 1231   Placed By: ICU physician  Laryngoscope Blade: 4  ETT Types: Oral  Size (mm): 8 mm  Cuffed: Cuffed  Airway Equipment: Video Laryngoscope  Placement Confirmation: Direct Visualization;Bilateral Breath Sounds;CXR Conf...  Secured at (cm) 26 cm  Measured From Lips  Secured Location Right  Secured By Actuary Repositioned Yes  Prone position No  Cuff Pressure (cm H2O) Clear OR 27-39 CmH2O  Site Condition Dry  Adult Ventilator Settings  Vent Type Servo i  Humidity HME  Vent Mode PRVC  Vt Set 540 mL  Set Rate 15 bmp  FiO2 (%) 30 %  I Time 0.9 Sec(s)  PEEP 5 cmH20  Sensitivity Postive 2  Adult Ventilator Measurements  Peak Airway Pressure 26 L/min  Mean Airway Pressure 16 cmH20  Plateau Pressure 21 cmH20  Resp Rate Spontaneous 10 br/min  Resp Rate Total 25 br/min  Exhaled Vt 653 mL  Measured Ve 12.1 mL  I:E Ratio Measured 1:1.8  Auto PEEP 0 cmH20  Total PEEP 5 cmH20  SpO2 99 %  Adult Ventilator Alarms  Alarms On Y  Ve High Alarm 24 L/min  Ve Low Alarm 4 L/min  Resp Rate High Alarm 42 br/min  Resp Rate Low Alarm 8  PEEP Low Alarm 3 cmH2O  Press High Alarm 45 cmH2O  Daily Weaning Assessment  Daily Assessment of Readiness to Wean Wean protocol criteria not met  Reason not met  (per note trach 8/29)  Breath Sounds  Bilateral Breath Sounds Rhonchi  Airway Suctioning/Secretions  Suction Type ETT  Suction Device  Catheter  Secretion Amount Copious  Secretion Color Yellow  Secretion Consistency Thick  Suction Tolerance Tolerated well  Suctioning Adverse Effects None

## 2021-01-04 NOTE — Progress Notes (Signed)
[facility] NEPHROLOGY PROGRESS NOTE  Assessment/ Plan:  # Dialysis dependent AKI/CKD stage IIIb - ischemic ATN in setting of severe sepsis and multisystem organ failure.  Admission creatinine was 2.1 and earlier on CRRT (stopped after 8/9) and transitioned to IHD-underwent left IJ TDC placement on 8/15.  Now oliguric at best.  Noted palliative care team has seen patient during this hospitalization. Family desires to continue current full scope of treatment.  Cont HD on THS Schedule: 2K, TDC, no heparin, 3 hours, 2 to 3 L UF    # Altered mental status-with prior history of CVA in 2015 and seizure disorder.  Per primary  # HTN/Vol: BPs stable. Weights stable. UF as able   # Sepsis/aspiration pneumonia -resolved. Per primary  # Chronic hypoxic respiratory failure -status post tracheostomy patient removed tracheostomy 8/25 requiring intubation, plan per CCM/ENT  # Stage IIIB colon cancer - sp partial colectomy, colostomy. On chemoRx sp 3 cycles.  Oncology has been following along peripherally  # Right leg DVT - anticoagulation per primary team.  # Atrial fibrillation - anticoaguolation per primary, rate controlled.  # Anemia of critical illness: Transfuse as needed. holding erythropoietin with active malignancy.  #CKD-BMD: Intermittent hyperphosphatemia, trend.  Calcium stable.  Subjective:  HD yesterday 3L UF No interval events Likely for trach tomorrow  Objective Vital signs in last 24 hours: Vitals:   01/04/21 0400 01/04/21 0432 01/04/21 0744 01/04/21 0753  BP: 131/78     Pulse: 92     Resp: (!) 23     Temp:   98.6 F (37 C)   TempSrc:   Axillary   SpO2: 100%   99%  Weight:  104.6 kg    Height:       Weight change: 1.1 kg  Intake/Output Summary (Last 24 hours) at 01/04/2021 1039 Last data filed at 01/04/2021 0603 Gross per 24 hour  Intake 1603.35 ml  Output 4400 ml  Net -2796.65 ml        Labs: Basic Metabolic Panel: Recent Labs  Lab  01/02/21 0604 01/03/21 0129 01/04/21 0016  NA 131* 129*  131* 132*  K 4.0 3.9  3.5 3.3*  CL 95* 95*  99 93*  CO2 24 21*  20* 24  GLUCOSE 131* 108*  97 128*  BUN 34* 48*  45* 23  CREATININE 3.57* 4.55*  3.97* 2.71*  CALCIUM 8.5* 8.7*  7.8* 8.6*  PHOS 5.2* 6.0* 4.3    Liver Function Tests: Recent Labs  Lab 01/02/21 0604 01/03/21 0129 01/04/21 0016  AST  --  13*  --   ALT  --  11  --   ALKPHOS  --  93  --   BILITOT  --  0.7  --   PROT  --  6.5  --   ALBUMIN 1.9* 1.8*  1.6* 1.9*    No results for input(s): LIPASE, AMYLASE in the last 168 hours. No results for input(s): AMMONIA in the last 168 hours. CBC: Recent Labs  Lab 12/31/20 0431 01/01/21 0410 01/01/21 1430 01/02/21 0604 01/03/21 0129 01/04/21 0016  WBC 12.4* 13.8*  --  16.1* 14.9* 13.9*  NEUTROABS  --   --   --   --  10.5*  --   HGB 7.2* 7.4*   < > 7.3* 6.9* 8.4*  HCT 21.9* 21.8*   < > 22.4* 20.4* 24.9*  MCV 89.4 89.3  --  89.2 87.6 85.9  PLT 208 248  --  254 236 250   < > =  values in this interval not displayed.    Cardiac Enzymes: No results for input(s): CKTOTAL, CKMB, CKMBINDEX, TROPONINI in the last 168 hours. CBG: Recent Labs  Lab 01/03/21 1502 01/03/21 1935 01/03/21 2331 01/04/21 0323 01/04/21 0721  GLUCAP 159* 100* 132* 111* 120*     Iron Studies: No results for input(s): IRON, TIBC, TRANSFERRIN, FERRITIN in the last 72 hours. Studies/Results: DG CHEST PORT 1 VIEW  Result Date: 01/03/2021 CLINICAL DATA:  Respiratory failure. EXAM: PORTABLE CHEST 1 VIEW COMPARISON:  01/01/2021 FINDINGS: ET tube tip is stable above the carina. There is a feeding tube with tip below the field of view. Left chest wall dialysis catheter projects over the cavoatrial junction. Right porta catheter is noted with tip also in the expected location of the cavoatrial junction. Stable cardiomediastinal contours. Decreased lung volumes with subsegmental atelectasis in the right base. IMPRESSION: 1. Stable  support apparatus. 2. Decreased lung volumes with right base subsegmental atelectasis. Electronically Signed   By: Kerby Moors M.D.   On: 01/03/2021 08:39    Medications: Infusions:  sodium chloride Stopped (12/20/20 0955)   feeding supplement (NEPRO CARB STEADY) 1,000 mL (01/04/21 6415)   fentaNYL infusion INTRAVENOUS 200 mcg/hr (01/04/21 0603)   heparin 1,250 Units/hr (01/03/21 2150)    Scheduled Medications:  sodium chloride   Intravenous Once   acetaminophen  1,000 mg Per Tube Q8H   vitamin C  500 mg Per Tube TID   atorvastatin  20 mg Per Tube Daily   chlorhexidine gluconate (MEDLINE KIT)  15 mL Mouth Rinse BID   Chlorhexidine Gluconate Cloth  6 each Topical Daily   clonazepam  1 mg Per Tube Q6H   feeding supplement (PROSource TF)  45 mL Per Tube BID   fiber  1 packet Per Tube BID   glycopyrrolate  1 mg Per Tube BID   hydrocerin   Topical BID   insulin aspart  0-15 Units Subcutaneous Q4H   insulin aspart  4 Units Subcutaneous Q4H   insulin detemir  18 Units Subcutaneous BID   levETIRAcetam  500 mg Per Tube BID   mouth rinse  15 mL Mouth Rinse 10 times per day   multivitamin  1 tablet Per Tube QHS   pantoprazole sodium  40 mg Per Tube BID   QUEtiapine  100 mg Per Tube BID   thiamine  100 mg Per Tube Daily    have reviewed scheduled and prn medications.  Physical Exam: General: Critically ill looking male, intubated and sedated Heart:RRR, s1s2 nl Lungs: Coarse breath sounds anteriorly Abdomen:soft, Non-tender, non-distended Extremities: Trace b/l LE edema  with chronic venous stasis change. Dialysis Access: [facility] in place.   Dahl Higinbotham B Moriah Shawley 01/04/2021,10:39 AM  LOS: 34 days

## 2021-01-04 NOTE — Progress Notes (Signed)
NAME:  Thomas [name], MRN:  [id number], DOB:  March 01, 1956, LOS: 77 ADMISSION DATE:  11/30/2020, CONSULTATION DATE:  7/30 REFERRING MD:  Riccardo Dubin, CHIEF COMPLAINT:  Worsening hypoxia/dyspnea  History of Present Illness:  65 yo M admitted for worsening sepsis vs reaction to chemotherapy with high ostomy output and soft BP on 7/25. Has had issues during his hospitalization with recurrent aspirations and more recently worsening respiratory status.  Most recently pt developed worsening hypoxia and was requiring BIPAP w/ FIO2 of 100% and increased work of breathing.   Who was admitted to intensive care and subsequently had worsening clinical status requiring intubation, CRRT, multiple pressors and pneumonia along with CT findings 8/1 concerning for ischemic bowel.   Significant Hospital Events:  7/27 Transferred to [facility], stopped abx as sepsis resolved/ruled out 7/30 AM Worsened AME, more hypoxic with worsening renal funciton, new aspiration on abx restarted 7/30 PM Pt with increased o2 requirement on 100% FIO2 on BIPAP, d/w family who wanted to proceed with intubation, pt intubated 8/1 Some issues with tachycardia and hypotension overnight. Reproted high ostomy output  8/2 down to 9 mcg of Levophed, however repeat CT abdomen/pelvis showed inflammatory wall thickening and fat stranding of the descending and sigmoid colon, new since prior, consistent with nonspecific infectious, inflammatory, or ischemic colitis.  Also new dense consolidative airspace opacity in the right lower and right middle lobes 8/3 further weaning NE. Weaning sedation. Full code, full scope per family meeting 8/2  8/4 off pressors, plt down to 103.  8/5 remains off pressors. Moving around more, not really following commands though  8/9-failed extubation 8/8, tolerated nasal cannula for about an hour, desaturations and inability to clear secretions-reintubated 8/8 8/10 some agitation overnight 8/11 tracheostomy placement On trach  collar since 8/16 8/22 pulled trach out, replaced under bronch guidance. Has proximal area of granulation tissue that is above the bottom of the trach tube.  8/25 Removed Trach, unable to reinsert, despite multiple attempts reintubated  Micro: Resp viral panel 7/24 > neg covid/ flu BC  7/24  x 2 neg  MRSA  7/25 neg  UC   7/25  neg  C diff screeen 7/26  neg ET culture 7/31 > moderate yeast> grew mod candida tropicalis GI panel  8/1> neg   Abx: Unasyn 7/30 >>7/31 Maxepime 7/31 >> 8/7, 8/17> 8/21 Flagyl 7/31 >>  8/7 Vancomycin 8/1 only  Anidulafungin 8/1>>8/3  Interim History / Subjective:   No acute event overnight. 1 unit PRBC given for low Hb. No active bleed  Objective   Blood pressure 131/78, pulse 92, temperature 98.6 F (37 C), temperature source Axillary, resp. rate (!) 23, height 5\' 8"  (1.727 m), weight 104.6 kg, SpO2 99 %.    Vent Mode: PRVC FiO2 (%):  [30 %] 30 % Set Rate:  [15 bmp] 15 bmp Vt Set:  [540 mL] 540 mL PEEP:  [5 cmH20] 5 cmH20 Plateau Pressure:  [16 cmH20-27 cmH20] 21 cmH20   Intake/Output Summary (Last 24 hours) at 01/04/2021 1128 Last data filed at 01/04/2021 0603 Gross per 24 hour  Intake 1512.38 ml  Output 4400 ml  Net -2887.62 ml   Filed Weights   01/03/21 1620 01/03/21 1930 01/04/21 0432  Weight: 108.6 kg 105.6 kg 104.6 kg     Physical exam: Gen:      No acute distress, chronically ill appearing HEENT:  EOMI, sclera anicteric Neck:     No masses; no thyromegaly, ETT Lungs:    Clear to auscultation bilaterally;  normal respiratory effort CV:         Regular rate and rhythm; no murmurs Abd:      + bowel sounds; soft, non-tender; no palpable masses, no distension Ext:    No edema; adequate peripheral perfusion Skin:      Warm and dry; no rash Neuro: Somnolent, responsive  Labs/imaging personally reviewed Sodium improved to 132, creatinine 2.71, WBC count stable at 13.9 No new imaging  Resolved Hospital Problem list   Shock  HHS Afib  RVR  Lactic Acidosis  Thrombocytopenia colitis  Assessment & Plan:   Acute hypoxemic respiratory failure s/p tracheostomy on 8/11 Aspiration pneumonia Endotracheal granulation tissue at tracheostomy site; superior to distal end of trach so not occlusive with trach in Patient had failed extubation 8/8 secondary to increased secretions. Tracheostomy performed 8/11 due to excessive secretions, due to difficult vent wean.  01/01/2021 patient removed his tracheostomy replaced at bedside transferred to intensive care unit intubated from above. Continue vent support For trach revision by ENT on 8/29 Hold tube feeds at midnight Follow intermittent chest x-ray  Renal insufficiency HD per nephrology  Hyponatremia Plan Off free water, give normal saline bolus Trend BMET  Diarrhea Contributing to hyponatremia 1126 of stool out last 24 hours Plan Imodium prn Fluid repletion as needed  Colorectal cancer Per oncology  LE DVT On heparin drip. Will hold after midnight for trach  The patient is critically ill with multiple organ system failure and requires high complexity decision making for assessment and support, frequent evaluation and titration of therapies, advanced monitoring, review of radiographic studies and interpretation of complex data.   Critical Care Time devoted to patient care services, exclusive of separately billable procedures, described in this note is 35 minutes.   Marshell Garfinkel MD [facility] See Amion for pager  If no response to pager , please call [phone] until 7pm After 7:00 pm call Elink  [phone] 01/04/2021, 11:28 AM

## 2021-01-05 ENCOUNTER — Inpatient Hospital Stay (HOSPITAL_COMMUNITY): Payer: Medicare HMO

## 2021-01-05 ENCOUNTER — Encounter (HOSPITAL_COMMUNITY)
Admission: EM | Disposition: A | Discharge status: Home / Self Care | Payer: Self-pay | Source: Home / Self Care | Attending: Family Medicine

## 2021-01-05 DIAGNOSIS — E1100 Type 2 diabetes mellitus with hyperosmolarity without nonketotic hyperglycemic-hyperosmolar coma (NKHHC): Secondary | ICD-10-CM | POA: Diagnosis not present

## 2021-01-05 HISTORY — PX: TRACHEOSTOMY TUBE PLACEMENT: SHX814

## 2021-01-05 LAB — CBC
HCT: 24.4 % — ABNORMAL LOW (ref 39.0–52.0)
Hemoglobin: 8.2 g/dL — ABNORMAL LOW (ref 13.0–17.0)
MCH: 29.1 pg (ref 26.0–34.0)
MCHC: 33.6 g/dL (ref 30.0–36.0)
MCV: 86.5 fL (ref 80.0–100.0)
Platelets: 305 10*3/uL (ref 150–400)
RBC: 2.82 MIL/uL — ABNORMAL LOW (ref 4.22–5.81)
RDW: 17.5 % — ABNORMAL HIGH (ref 11.5–15.5)
WBC: 14.3 10*3/uL — ABNORMAL HIGH (ref 4.0–10.5)
nRBC: 0.1 % (ref 0.0–0.2)

## 2021-01-05 LAB — GLUCOSE, CAPILLARY
Glucose-Capillary: 100 mg/dL — ABNORMAL HIGH (ref 70–99)
Glucose-Capillary: 108 mg/dL — ABNORMAL HIGH (ref 70–99)
Glucose-Capillary: 130 mg/dL — ABNORMAL HIGH (ref 70–99)
Glucose-Capillary: 141 mg/dL — ABNORMAL HIGH (ref 70–99)
Glucose-Capillary: 82 mg/dL (ref 70–99)
Glucose-Capillary: 82 mg/dL (ref 70–99)

## 2021-01-05 LAB — PHOSPHORUS: Phosphorus: 5.6 mg/dL — ABNORMAL HIGH (ref 2.5–4.6)

## 2021-01-05 LAB — RENAL FUNCTION PANEL
Albumin: 1.9 g/dL — ABNORMAL LOW (ref 3.5–5.0)
Anion gap: 14 (ref 5–15)
BUN: 43 mg/dL — ABNORMAL HIGH (ref 8–23)
CO2: 19 mmol/L — ABNORMAL LOW (ref 22–32)
Calcium: 8.8 mg/dL — ABNORMAL LOW (ref 8.9–10.3)
Chloride: 100 mmol/L (ref 98–111)
Creatinine, Ser: 3.85 mg/dL — ABNORMAL HIGH (ref 0.61–1.24)
GFR, Estimated: 17 mL/min — ABNORMAL LOW (ref >60)
Glucose, Bld: 87 mg/dL (ref 70–99)
Phosphorus: 5.4 mg/dL — ABNORMAL HIGH (ref 2.5–4.6)
Potassium: 3.8 mmol/L (ref 3.5–5.1)
Sodium: 133 mmol/L — ABNORMAL LOW (ref 135–145)

## 2021-01-05 LAB — MAGNESIUM: Magnesium: 2 mg/dL (ref 1.7–2.4)

## 2021-01-05 SURGERY — CREATION, TRACHEOSTOMY
Anesthesia: General | Site: Neck

## 2021-01-05 MED ORDER — 0.9 % SODIUM CHLORIDE (POUR BTL) OPTIME
TOPICAL | Status: DC | PRN
  Administered 2021-01-05: 1000 mL

## 2021-01-05 MED ORDER — ONDANSETRON HCL 4 MG/2ML IJ SOLN
INTRAMUSCULAR | Status: DC | PRN
  Administered 2021-01-05: 4 mg via INTRAVENOUS

## 2021-01-05 MED ORDER — SUGAMMADEX SODIUM 200 MG/2ML IV SOLN
INTRAVENOUS | Status: DC | PRN
  Administered 2021-01-05: 200 mg via INTRAVENOUS

## 2021-01-05 MED ORDER — LIDOCAINE-EPINEPHRINE 1 %-1:100000 IJ SOLN
INTRAMUSCULAR | Status: AC
  Filled 2021-01-05: qty 1

## 2021-01-05 MED ORDER — LIDOCAINE-EPINEPHRINE 1 %-1:100000 IJ SOLN: INTRAMUSCULAR | Status: DC | PRN

## 2021-01-05 MED ORDER — HEPARIN (PORCINE) 25000 UT/250ML-% IV SOLN
1450.0000 [IU]/h | INTRAVENOUS | Status: AC
  Administered 2021-01-05 – 2021-01-06 (×2): 1250 [IU]/h via INTRAVENOUS
  Administered 2021-01-07: 1450 [IU]/h via INTRAVENOUS
  Filled 2021-01-05 (×2): qty 250

## 2021-01-05 MED ORDER — ROCURONIUM BROMIDE 100 MG/10ML IV SOLN
INTRAVENOUS | Status: DC | PRN
  Administered 2021-01-05 (×2): 20 mg via INTRAVENOUS

## 2021-01-05 MED ORDER — MIDAZOLAM HCL 2 MG/2ML IJ SOLN
INTRAMUSCULAR | Status: AC
  Filled 2021-01-05: qty 2

## 2021-01-05 MED ORDER — FENTANYL CITRATE (PF) 250 MCG/5ML IJ SOLN
INTRAMUSCULAR | Status: AC
  Filled 2021-01-05: qty 5

## 2021-01-05 SURGICAL SUPPLY — 37 items
APL SKNCLS STERI-STRIP NONHPOA (GAUZE/BANDAGES/DRESSINGS)
BAG COUNTER SPONGE SURGICOUNT (BAG) ×2 IMPLANT
BAG SPNG CNTER NS LX DISP (BAG) ×1
BENZOIN TINCTURE PRP APPL 2/3 (GAUZE/BANDAGES/DRESSINGS) IMPLANT
BLADE CLIPPER SURG (BLADE) IMPLANT
CANISTER SUCT 3000ML PPV (MISCELLANEOUS) ×2 IMPLANT
CLEANER TIP ELECTROSURG 2X2 (MISCELLANEOUS) ×2 IMPLANT
COVER SURGICAL LIGHT HANDLE (MISCELLANEOUS) ×2 IMPLANT
DECANTER SPIKE VIAL GLASS SM (MISCELLANEOUS) ×2 IMPLANT
DRAPE HALF SHEET 40X57 (DRAPES) IMPLANT
ELECT COATED BLADE 2.86 ST (ELECTRODE) ×2 IMPLANT
ELECT REM PT RETURN 9FT ADLT (ELECTROSURGICAL) ×2
ELECTRODE REM PT RTRN 9FT ADLT (ELECTROSURGICAL) ×1 IMPLANT
GAUZE 4X4 16PLY ~~LOC~~+RFID DBL (SPONGE) ×2 IMPLANT
GLOVE SURG LTX SZ7.5 (GLOVE) ×2 IMPLANT
GOWN STRL REUS W/ TWL LRG LVL3 (GOWN DISPOSABLE) ×2 IMPLANT
GOWN STRL REUS W/TWL LRG LVL3 (GOWN DISPOSABLE) ×4
KIT BASIN OR (CUSTOM PROCEDURE TRAY) ×2 IMPLANT
KIT TURNOVER KIT B (KITS) ×2 IMPLANT
NDL PRECISIONGLIDE 27X1.5 (NEEDLE) ×1 IMPLANT
NEEDLE PRECISIONGLIDE 27X1.5 (NEEDLE) ×2 IMPLANT
NS IRRIG 1000ML POUR BTL (IV SOLUTION) ×2 IMPLANT
PAD ARMBOARD 7.5X6 YLW CONV (MISCELLANEOUS) ×4 IMPLANT
PENCIL FOOT CONTROL (ELECTRODE) ×2 IMPLANT
SUT CHROMIC 2 0 SH (SUTURE) ×2 IMPLANT
SUT ETHILON 3 0 PS 1 (SUTURE) ×2 IMPLANT
SUT SILK 4 0 (SUTURE) ×2
SUT SILK 4 0 TIE 10X30 (SUTURE) ×2 IMPLANT
SUT SILK 4-0 18XBRD TIE 12 (SUTURE) ×1 IMPLANT
SYR 20ML LL LF (SYRINGE) ×2 IMPLANT
SYR CONTROL 10ML LL (SYRINGE) IMPLANT
TOWEL GREEN STERILE FF (TOWEL DISPOSABLE) ×2 IMPLANT
TRAY ENT MC OR (CUSTOM PROCEDURE TRAY) ×2 IMPLANT
TUBE CONNECTING 12X1/4 (SUCTIONS) ×2 IMPLANT
TUBE TRACH 6.0 EXL PROX CUF (TUBING) ×1 IMPLANT
TUBE TRACH 7.0 EXL PROX CUF (TUBING) IMPLANT
WATER STERILE IRR 1000ML POUR (IV SOLUTION) ×2 IMPLANT

## 2021-01-05 NOTE — Progress Notes (Signed)
Patient ID: Thomas [name], male   DOB: 1955/05/16, 65 y.o.   MRN: [id number] S: NO events overnight O:BP 128/62   Pulse 96   Temp 98.5 F (36.9 C) (Oral)   Resp (!) 22   Ht 5\' 8"  (1.727 m)   Wt 106.1 kg   SpO2 100%   BMI 35.57 kg/m   Intake/Output Summary (Last 24 hours) at 01/05/2021 01/07/2021 Last data filed at 01/05/2021 0600 Gross per 24 hour  Intake 1727.96 ml  Output 750 ml  Net 977.96 ml   Intake/Output: I/O last 3 completed shifts: In: 2786.5 [I.V.:1207.8; NG/GT:1307.1; IV Piggyback:271.7] Out: 4750 [Other:3000; Stool:1750]  Intake/Output this shift:  No intake/output data recorded. Weight change: -2.5 kg 07-31-2002 and agitated CVS:RRR Resp: scattered rhonchi Abd: +BS, soft, NT/ND Ext: no edema  Recent Labs  Lab 12/30/20 0410 12/31/20 0431 01/01/21 0410 01/01/21 1430 01/02/21 0604 01/03/21 0129 01/04/21 0016 01/05/21 0306  NA 131* 131* 129* 130* 131* 129*  131* 132* 133*  K 4.4 4.3 4.9 4.8 4.0 3.9  3.5 3.3* 3.8  CL 96* 96* 94*  --  95* 95*  99 93* 100  CO2 22 23 21*  --  24 21*  20* 24 19*  GLUCOSE 134* 173* 110*  --  131* 108*  97 128* 87  BUN 58* 46* 59*  --  34* 48*  45* 23 43*  CREATININE 5.33* 4.44* 5.22*  --  3.57* 4.55*  3.97* 2.71* 3.85*  ALBUMIN 1.7* 1.7* 1.7*  --  1.9* 1.8*  1.6* 1.9* 1.9*  CALCIUM 8.2* 8.2* 8.5*  --  8.5* 8.7*  7.8* 8.6* 8.8*  PHOS 7.1* 6.2* 7.4*  --  5.2* 6.0* 4.3 5.4*  5.6*  AST  --   --   --   --   --  13*  --   --   ALT  --   --   --   --   --  11  --   --    Liver Function Tests: Recent Labs  Lab 01/03/21 0129 01/04/21 0016 01/05/21 0306  AST 13*  --   --   ALT 11  --   --   ALKPHOS 93  --   --   BILITOT 0.7  --   --   PROT 6.5  --   --   ALBUMIN 1.8*  1.6* 1.9* 1.9*   No results for input(s): LIPASE, AMYLASE in the last 168 hours. No results for input(s): AMMONIA in the last 168 hours. CBC: Recent Labs  Lab 01/01/21 0410 01/01/21 1430 01/02/21 0604 01/03/21 0129 01/04/21 0016 01/05/21 0306   WBC 13.8*  --  16.1* 14.9* 13.9* 14.3*  NEUTROABS  --   --   --  10.5*  --   --   HGB 7.4*   < > 7.3* 6.9* 8.4* 8.2*  HCT 21.8*   < > 22.4* 20.4* 24.9* 24.4*  MCV 89.3  --  89.2 87.6 85.9 86.5  PLT 248  --  254 236 250 305   < > = values in this interval not displayed.   Cardiac Enzymes: No results for input(s): CKTOTAL, CKMB, CKMBINDEX, TROPONINI in the last 168 hours. CBG: Recent Labs  Lab 01/04/21 1705 01/04/21 1920 01/04/21 2348 01/05/21 0347 01/05/21 0720  GLUCAP 144* 98 116* 82 100*    Iron Studies: No results for input(s): IRON, TIBC, TRANSFERRIN, FERRITIN in the last 72 hours. Studies/Results: No results found.  sodium chloride   Intravenous Once  acetaminophen  1,000 mg Per Tube Q8H   vitamin C  500 mg Per Tube TID   atorvastatin  20 mg Per Tube Daily   chlorhexidine gluconate (MEDLINE KIT)  15 mL Mouth Rinse BID   Chlorhexidine Gluconate Cloth  6 each Topical Daily   clonazepam  1 mg Per Tube Q6H   feeding supplement (PROSource TF)  45 mL Per Tube BID   fiber  1 packet Per Tube BID   glycopyrrolate  1 mg Per Tube BID   hydrocerin   Topical BID   insulin aspart  0-15 Units Subcutaneous Q4H   insulin aspart  4 Units Subcutaneous Q4H   insulin detemir  15 Units Subcutaneous BID   levETIRAcetam  500 mg Per Tube BID   mouth rinse  15 mL Mouth Rinse 10 times per day   multivitamin  1 tablet Per Tube QHS   pantoprazole sodium  40 mg Per Tube BID   QUEtiapine  100 mg Per Tube BID   thiamine  100 mg Per Tube Daily    BMET    Component Value Date/Time   NA 133 (L) 01/05/2021 0306   K 3.8 01/05/2021 0306   CL 100 01/05/2021 0306   CO2 19 (L) 01/05/2021 0306   GLUCOSE 87 01/05/2021 0306   BUN 43 (H) 01/05/2021 0306   CREATININE 3.85 (H) 01/05/2021 0306   CREATININE 1.83 (H) 11/19/2020 0914   CALCIUM 8.8 (L) 01/05/2021 0306   GFRNONAA 17 (L) 01/05/2021 0306   GFRNONAA 41 (L) 11/19/2020 0914   GFRAA >60 04/01/2017 1921   CBC    Component Value Date/Time    WBC 14.3 (H) 01/05/2021 0306   RBC 2.82 (L) 01/05/2021 0306   HGB 8.2 (L) 01/05/2021 0306   HGB 11.2 (L) 11/19/2020 0914   HCT 24.4 (L) 01/05/2021 0306   PLT 305 01/05/2021 0306   PLT 212 11/19/2020 0914   MCV 86.5 01/05/2021 0306   MCH 29.1 01/05/2021 0306   MCHC 33.6 01/05/2021 0306   RDW 17.5 (H) 01/05/2021 0306   LYMPHSABS 1.8 01/03/2021 0129   MONOABS 1.7 (H) 01/03/2021 0129   EOSABS 0.5 01/03/2021 0129   BASOSABS 0.1 01/03/2021 0129    Assessment/Plan:   Dialysis-dependent AKI/CKD stage IIIb - presumably ischemic ATN in setting of severe sepsis and MSOF and remains oliguric.  CRRT initiated 7/31 and transitioned to IHD on 12/19/20.  S/p [facility] placement 12/22/20.  Continue with TTS schedule for now. Anasarca - improved with HD AMS - h/o CVA and seizure disorder. Acute hypoxic respiratory failure/VDRF - aspiration pneumonia.  s/p tracheostomy, however pt removed it on 01/01/21 s/p intubation.  Plan per ENT/PCCM Right leg DVT - now on argatroban. Thrombocytopenia - presumably due to critical illness.  Hematology following and HITT panel pending.  Heparin stopped 12/12/20 after starting argatroban for RLE DVT.  Platelets improved slightly to 94.   Metabolic acidosis - nongap.  Currently on IHD. Colon cancer stage IIIB - s/p partial colectomy, colostomy.  S/p 3 cycles of chemo.   Atrial fibrillation -on amiodarone and rate controlled. Anemia of critical illness- transfuse prn. Acute metabolic encephalopathy - on fent gtt, zygpreza, and changing to precedex gtt. Hypophosphatemia - improved after IV repletion.  Continue to follow.  Disposition - poor overall prognosis.  He is not suitable for outpatient HD, doubt he will be candidate for LTC facility.  Palliative care consult (initially seen 12/08/20).  Unfortunately full code for now.  Donetta Potts, MD [facility] (  [phone]

## 2021-01-05 NOTE — Interval H&P Note (Signed)
History and Physical Interval Note:  01/05/2021 12:32 PM  Thomas [name]  has presented today for surgery, with the diagnosis of VENTILATOR DEPENDENT.  The various methods of treatment have been discussed with the patient and family. After consideration of risks, benefits and other options for treatment, the patient has consented to  Procedure(s): TRACHEOSTOMY (N/A) as a surgical intervention.  The patient's history has been reviewed, patient examined, no change in status, stable for surgery.  I have reviewed the patient's chart and labs.  Questions were answered to the patient's satisfaction.     [name]

## 2021-01-05 NOTE — Progress Notes (Addendum)
NAME:  Thomas [name], MRN:  [id number], DOB:  03-28-1956, LOS: 45 ADMISSION DATE:  11/30/2020, CONSULTATION DATE:  7/30 REFERRING MD:  Riccardo Dubin, CHIEF COMPLAINT:  Worsening hypoxia/dyspnea  History of Present Illness:  65 yo M admitted 7/24 for worsening sepsis vs reaction to chemotherapy with high ostomy output and soft BP on 7/25. Has had issues during his hospitalization with recurrent aspirations and more recently worsening respiratory status.  Most recently pt developed worsening hypoxia and was requiring BIPAP w/ FIO2 of 100% and increased work of breathing.   He was admitted to intensive care and subsequently had worsening clinical status requiring intubation, CRRT, multiple pressors and pneumonia along with CT findings 8/1 concerning for ischemic bowel.  Significant Hospital Events:  7/27 Transferred to [facility], stopped abx as sepsis resolved/ruled out 7/30 AM Worsened AME, more hypoxic with worsening renal funciton, new aspiration on abx restarted 7/30 PM Pt with increased o2 requirement on 100% FIO2 on BIPAP, d/w family who wanted to proceed with intubation, pt intubated 7/31 Tracheal aspirate grew moderate candida tropicalis  8/1 Some issues with tachycardia and hypotension overnight. Reproted high ostomy output.  GI PCR negative 8/2 down to 9 mcg of Levophed, however repeat CT abdomen/pelvis showed inflammatory wall thickening and fat stranding of the descending and sigmoid colon, new since prior, consistent with nonspecific infectious, inflammatory, or ischemic colitis.  Also new dense consolidative airspace opacity in the right lower and right middle lobes 8/3 further weaning NE. Weaning sedation. Full code, full scope per family meeting 8/2. Eraxis stopped (8/1-8/3) 8/4 off pressors, plt down to 103.  8/5 remains off pressors. Moving around more, not really following commands though  8/9 failed extubation 8/8, tolerated nasal cannula for about an hour, desaturations and inability to  clear secretions-reintubated 8/8 8/10 some agitation overnight 8/11 tracheostomy placement On trach collar since 8/16 8/22 pulled trach out, replaced under bronch guidance. Has proximal area of granulation tissue that is above the bottom of the trach tube.  8/25 Removed Trach, unable to reinsert, despite multiple attempts re-intubated 8/29 Anemia, 1 unit PRBC   Interim History / Subjective:  Afebrile / WBC 14.3  Vent - 30% fiO2, PEEP 5 Glucose range 82-100 I/O +1L in last 24 hours  Family at bedside  Objective   Blood pressure 128/62, pulse 96, temperature 98.5 F (36.9 C), temperature source Oral, resp. rate (!) 22, height 5\' 8"  (1.727 m), weight 106.1 kg, SpO2 100 %.    Vent Mode: PRVC FiO2 (%):  [30 %] 30 % Set Rate:  [15 bmp] 15 bmp Vt Set:  [540 mL] 540 mL PEEP:  [5 cmH20] 5 cmH20 Plateau Pressure:  [15 cmH20-20 cmH20] 18 cmH20   Intake/Output Summary (Last 24 hours) at 01/05/2021 8250 Last data filed at 01/05/2021 0600 Gross per 24 hour  Intake 1727.96 ml  Output 750 ml  Net 977.96 ml   Filed Weights   01/03/21 1930 01/04/21 0432 01/05/21 0500  Weight: 105.6 kg 104.6 kg 106.1 kg     Exam: General: critically ill appearing adult male lying in bed in NAD HEENT: MM pink/moist, ETT, anicteric  Neuro: Awakens to voice, makes eye contact, nods yes/no CV: s1s2 RRR, no m/r/g PULM: non-labored  GI: soft, bsx4 active  Extremities: warm/dry, trace LE edema  Skin: no rashes or lesions. R chest wall port-a-cath, left tunneled HD cath   Resolved Hospital Problem list   Shock  HHS Afib RVR  Lactic Acidosis  Thrombocytopenia Colitis  Assessment & Plan:  Acute Hypoxemic Respiratory Failure s/p Tracheostomy  Endotracheal Granulation Tissue  Aspiration PNA  OSA  Failed prior extubation due to secretions. Lurline Idol 8/11.  Pt removed trach 8/25, replaced at bedside but ultimately required intubation from above.  Pseudomonas in tracheal aspirate 8/15.  -PRVC 8cc/kg -wean  PEEP / fiO2 for sats >90% -appreciate ENT assistance with patient care  -anticipate trach revision 8/29, planned for 11 am  -hopeful to advance to ATC post trach once awake from sedation  -follow CXR  -follow secretions  AKI on CKD IIIb Hyponatremia  Suspect ischemic ATN in setting of sepsis, MSOF. Required CRRT, initiated 7/31 with transition to IHD 8/12, tunnled cath placed 8/15.   -appreciate Nephrology assistance > deemed not suitable for outpatient HD -HD per Nephrology T/Th/S  -Trend BMP / urinary output -Replace electrolytes as indicated  Diarrhea Contributing to hyponatremia.  Volume average 750-1.1L in last 4 days.  -PRN imodium  -replacement IVF as needed  -fiber PT   Colorectal cancer Completed chemotherapy in July 2022. Followed by Dr. Benay Spice. -Per ONC   LE DVT -heparin gtt on hold for trach 8/29 -likely can resume ~4-6 hours after trach assuming no bleeding  DM  -SSI, moderate scale -TF coverage 4 units Q4 -levemir 15 units BID   Epilepsy -continue keppra   GOC: -seen by palliative care -Full code    Best Practice  DVT: full dose anticoagulation  SUP: PPI Nutrition: TF Family: wife and daughter updated in detail 8/29 on plan of care   Critical Care Time: 25 minutes   Noe Gens, MSN, APRN, NP-C, AGACNP-BC [facility] 01/05/2021, 9:40 AM   Please see [email or web address] for pager details.   From 7A-7P if no response, please call [phone] After hours, please call ELink [phone]

## 2021-01-05 NOTE — Transfer of Care (Signed)
Immediate Anesthesia Transfer of Care Note  Patient: Thomas [name]  Procedure(s) Performed: TRACHEOSTOMY (Neck)  Patient Location: PICU  Anesthesia Type:General  Level of Consciousness: drowsy  Airway & Oxygen Therapy: Patient Spontanous Breathing and Patient placed on Ventilator (see vital sign flow sheet for setting)  Post-op Assessment: Report given to RN and Post -op Vital signs reviewed and stable  Post vital signs: Reviewed and stable  Last Vitals:  Vitals Value Taken Time  BP    Temp    Pulse    Resp    SpO2      Last Pain:  Vitals:   01/05/21 1158  TempSrc: Axillary      Patients Stated Pain Goal: 0 (91/55/02 7142)  Complications: No notable events documented.

## 2021-01-05 NOTE — Progress Notes (Signed)
ANTICOAGULATION CONSULT NOTE - Follow Consult  Pharmacy Consult for: heparin infusion Indication: RLE DVT 8/5  No Known Allergies  Patient Measurements: Height: 5\' 8"  (172.7 cm) Weight: 106.1 kg (233 lb 14.5 oz) IBW/kg (Calculated) : 68.4 Heparin Dosing Weight: 92 kg  Vital Signs: Temp: 98.7 F (37.1 C) (08/29 1512) Temp Source: Axillary (08/29 1512) BP: 129/74 (08/29 1200) Pulse Rate: 93 (08/29 1200)  Labs: Recent Labs    01/03/21 0129 01/03/21 1015 01/03/21 2054 01/04/21 0016 01/04/21 0706 01/04/21 1458 01/05/21 0306  HGB 6.9*  --   --  8.4*  --   --  8.2*  HCT 20.4*  --   --  24.9*  --   --  24.4*  PLT 236  --   --  250  --   --  305  APTT 149* 110*  --   --   --   --   --   HEPARINUNFRC 0.44 0.43 0.25*  --  0.40 0.32  --   CREATININE 4.55*  3.97*  --   --  2.71*  --   --  3.85*     Estimated Creatinine Clearance: 22.9 mL/min (A) (by C-G formula based on SCr of 3.85 mg/dL (H)).   Medical History: Past Medical History:  Diagnosis Date   Acute ischemic left MCA stroke ([place]) 04/09/2014   Acute respiratory failure with hypoxia ([place]) 04/09/2014   Aspiration pneumonia ([place])    Asthma    as a child   Brain tumor ([place])    frontal   Cancer ([place])    Confusion    occasionally   Diabetes mellitus without complication ([place])    takes Metformin daily.   Dizziness    Dyspnea    pt states d/t weight   Epilepsy ([place])    takes Keppra daily   GERD (gastroesophageal reflux disease)    takes Omeprazole daily   Headache    Hyperlipidemia    takes Atorvastatin daily   Hypertension    takes Lotrel daily   Peripheral edema    takes Lasix daily   Peripheral neuropathy    takes Gabapentin daily   Pneumonia 3 yrs ago   hx of   Seizures ([facility])    Sleep apnea    Urinary frequency     Medications:  Infusions:   sodium chloride Stopped (12/20/20 0955)   feeding supplement (NEPRO CARB STEADY) Stopped (01/05/21 0024)   fentaNYL infusion INTRAVENOUS 200 mcg/hr  (01/05/21 1147)    HPI/CC:  65 yo M presenting with potentially sepsis/ chemotherapy reaction.    Assessment: Transitioned from CRRT to [facility]. Diagnosed with RLE DVT on 8/5. Started on apixaban 5mg  BID. Last dose 8/25 AM.  Heparin drip started in the meantime in anticipation of trach placement on Mon 8/29.   Heparin held for trach revision today, to restart tonight at 1900 per CCM. Previously therapeutic on 1250 units/h.  Goal of Therapy:  Heparin level 0.3-0.7 units/ml Monitor platelets by anticoagulation protocol: Yes   Plan:  Resume heparin 1250 units/h at 1900 no bolus Daily heparin level and CBC  Arrie Senate, PharmD, [place], [facility] Clinical Pharmacist [phone] Please check AMION for all [place] numbers 01/05/2021

## 2021-01-05 NOTE — Progress Notes (Signed)
Physical Therapy Treatment Patient Details Name: Thomas [name] MRN: [id number] DOB: [birth year] Today's Date: 01/05/2021    History of Present Illness 65 y.o. male present to ED 2/24 with high ostomy output. Patient admitted with ischemic ATN in setting of severe sepsis and multisystem organ failure. Hospital admission complicated by volume overload, aspiration pneumonia, acute respiratory failure, AKI and ischemic colitis. S/p trach placement 8/11. CorTrak 8/12. HD initiated 8/12. To IR 8/15 for tunneled HD cath. Pulled out trach x2 8/22 replaced by PCCM. Pulled out trach again in 8/25 but unable to be reinserted. Transferred back to ICU. 8/29 to OR for stoma clean up and trach replacement.  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    PT Comments    Pt very restless on arrival, soon going to sx for I and D and replacement of trach.  Emphasis on transition to EOB, sitting balance with reaching tasks and sit to stands x3 with chair back and 2 person assist.    Follow Up Recommendations  SNF     Equipment Recommendations  Wheelchair (measurements PT);Wheelchair cushion (measurements PT);Hospital bed;Other (comment) (TBA further as status changes)    Recommendations for Other Services       Precautions / Restrictions Precautions Precautions: Fall Precaution Comments: cortrak, R subclavian tunneled HD cath,  re trached to replace ETT Restrictions Weight Bearing Restrictions: No    Mobility  Bed Mobility Overal bed mobility: Needs Assistance Bed Mobility: Supine to Sit;Sit to Supine     Supine to sit: Max assist;+2 for safety/equipment Sit to supine: Max assist;+2 for safety/equipment   General bed mobility comments: pt needed significant assist intially coming up via R UE  to full upright posture.  Tendency to list R intially, needing extensive assist to hold midline.  Later, much less assist to maintain BOS after activity.    Transfers Overall transfer level: Needs assistance Equipment used: 2 person hand held assist Transfers: Sit to/from Stand Sit to Stand: Max assist;+2 physical assistance;From elevated surface         General transfer comment: sit to stand x3 from EOB with chair back in front for UE support.  Initial block then guard of R>L LE. Pt able to attain submaximal upright stance.  Pt did not transfer to chair due to going to sx  Ambulation/Gait             General Gait Details: nt   Marine scientist Rankin (Stroke Patients Only)       Balance   Sitting-balance support: Bilateral upper extremity supported;Single extremity supported Sitting balance-Leahy Scale: Poor (working toward fair after up for a bit.) Sitting balance - Comments: mod to min guard over course of the session, but needing UE support Postural control: Right lateral lean                                  Cognition Arousal/Alertness: Awake/alert Behavior During Therapy: Restless Overall Cognitive Status: Difficult to assess                                 General Comments: pt follows commands  with increased time and cues.  Shakes head yes/no appropriately to questions.      Exercises Other Exercises Other Exercises: Warm up LE AA/PROM,  Basic instruction to wife on PROM and heelcord stretch.    General Comments General comments (skin integrity, edema, etc.): VSS on 30% FiO2 at 5 Peep, Sats 100%      Pertinent Vitals/Pain Pain Assessment: Faces Faces Pain Scale: Hurts little more Pain Location: abdomen Pain Descriptors / Indicators: Restless Pain Intervention(s): Monitored during session;Repositioned;Limited activity within patient's tolerance    Home Living                       Prior Function            PT Goals (current goals can now be found in the care plan section) Acute Rehab PT Goals Patient Stated Goal: to decrease pain PT Goal Formulation: With patient Time For Goal Achievement: 01/06/21 Potential to Achieve Goals: Fair Progress towards PT goals: Progressing toward goals    Frequency    Min 2X/week      PT Plan Current plan remains appropriate    Co-evaluation PT/OT/SLP Co-Evaluation/Treatment: Yes Reason for Co-Treatment: Complexity of the patient's impairments (multi-system involvement) PT goals addressed during session: Mobility/safety with mobility        AM-PAC PT "6 Clicks" Mobility   Outcome Measure  Help needed turning from your back to your side while in a flat bed without using bedrails?: A Lot Help needed moving from lying on your back to sitting on the side of a flat bed without using bedrails?: Total Help needed moving to and from a bed to a chair (including a wheelchair)?: Total Help needed standing up from a chair using your arms (e.g., wheelchair or bedside chair)?: Total Help needed to walk in hospital room?: Total Help needed climbing 3-5 steps with a railing? : Total 6 Click Score: 7    End of Session Equipment Utilized During Treatment: Oxygen Activity Tolerance: Patient tolerated treatment well Patient left: in bed;with call bell/phone within reach;with family/visitor present Nurse Communication: Mobility status;Need for lift equipment PT Visit Diagnosis: Muscle weakness (generalized) (M62.81);Difficulty in walking, not elsewhere classified (R26.2);Other abnormalities of gait and mobility (R26.89)     Time: 3664-4034 PT Time Calculation (min) (ACUTE ONLY): 23 min  Charges:  $Therapeutic Activity: 8-22 mins                     01/05/2021  Ginger Carne., PT Acute Rehabilitation Services [phone]  (pager) [phone]  (office)   Tessie Fass [place] 01/05/2021, 11:34 AM

## 2021-01-05 NOTE — Progress Notes (Signed)
Occupational Therapy Treatment Patient Details Name: Thomas [name] MRN: [id number] DOB: 1956/02/27 Today's Date: 01/05/2021    History of present illness 65 y.o. male present to ED 2/24 with high ostomy output. Patient admitted with ischemic ATN in setting of severe sepsis and multisystem organ failure. Hospital admission complicated by volume overload, aspiration pneumonia, acute respiratory failure, AKI and ischemic colitis. S/p trach placement 8/11. CorTrak 8/12. HD initiated 8/12. To IR 8/15 for tunneled HD cath. Pulled out trach x2 8/22 replaced by PCCM. Pulled out trach again in 8/25 but unable to be reinserted. Transferred back to ICU. 8/29 to OR for stoma clean up and trach replacement.  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   OT comments  Upon arrival, pt supine in bed (restlessly fidgeting) with wife and daughter at bedside. Pt continues to present with decreased activity tolerance, balance, and strength. Very motivated and agreeable to EOB and therapy. Pt participating in PROM/AAROM of BUE and BLEs while EOB. Pt performing sit<>stands from EOB with Max A +2 and use of recliner placed backward to support UEs. Returned to supine as pt planning for sx later today. Continue to recommend dc to SNF and will continue to follow acutely as admitted. VSS throughout.    Follow Up Recommendations  SNF;Supervision/Assistance - 24 hour    Equipment Recommendations  Other (comment) (Defer to next venue)    Recommendations for Other Services      Precautions / Restrictions Precautions Precautions: Fall Precaution Comments: cortrak, R subclavian tunneled HD cath,  re trached to replace ETT       Mobility Bed Mobility Overal bed mobility: Needs Assistance Bed Mobility: Supine to  Sit;Sit to Supine     Supine to sit: Max assist;+2 for safety/equipment Sit to supine: Max assist;+2 for safety/equipment   General bed mobility comments: pt needed significant assist intially coming up via R UE to full upright posture.  Tendency to list R intially, needing extensive assist to hold midline.  Later, much less assist to maintain BOS after activity.    Transfers Overall transfer level: Needs assistance Equipment used: 2 person hand held assist Transfers: Sit to/from Stand Sit to Stand: Max assist;+2 physical assistance;From elevated surface         General transfer comment: sit to stand x3 from EOB with chair back in front for UE support.  Initial block then guard of R>L LE. Pt able to attain submaximal upright stance.  Pt did not transfer to chair due to going to sx    Balance Overall balance assessment: Needs assistance Sitting-balance support: Bilateral upper extremity supported;Single extremity supported Sitting balance-Leahy Scale: Poor (working toward fair after up for a bit.) Sitting balance - Comments: mod to min guard over course of the session, but needing UE support Postural control: Right lateral lean                                 ADL either performed or assessed with clinical judgement   ADL Overall ADL's : Needs assistance/impaired Eating/Feeding: NPO                       Toilet Transfer: Maximal assistance;+2 for physical assistance (sit<>stand; simulated at EOB) Toilet Transfer Details (indicate cue type and reason):  Max A +2 for achieving upright posture and powering up into standing.         Functional mobility during ADLs: Maximal assistance;+2 for physical assistance (sit<>stand) General ADL Comments: Focused session on increasing activity tolerance and decreasing restlessness. Performing ROM exericses at EOB and then three sit<>stands     Vision       Perception     Praxis      Cognition  Arousal/Alertness: Awake/alert Behavior During Therapy: Restless Overall Cognitive Status: Difficult to assess                                 General Comments: pt follows commands with increased time and cues.  Shakes head yes/no appropriately to questions.        Exercises Exercises: Other exercises Other Exercises Other Exercises: Warm up LE AA/PROM,  Basic instruction to wife on PROM and heelcord stretch. Other Exercises: PROM/AAROM for BUEs while sitting at EOB. x10. max cues   Shoulder Instructions       General Comments VSS on 30% FiO2 at 5 Peep, Sats 100%    Pertinent Vitals/ Pain       Pain Assessment: Faces Faces Pain Scale: Hurts little more Pain Location: abdomen Pain Descriptors / Indicators: Restless Pain Intervention(s): Monitored during session;Limited activity within patient's tolerance;Repositioned  Home Living                                          Prior Functioning/Environment              Frequency  Min 2X/week        Progress Toward Goals  OT Goals(current goals can now be found in the care plan section)  Progress towards OT goals: Progressing toward goals  Acute Rehab OT Goals Patient Stated Goal: to decrease pain OT Goal Formulation: With patient Time For Goal Achievement: 01/06/21 Potential to Achieve Goals: Good ADL Goals Pt Will Perform Grooming: standing;with supervision Pt Will Perform Upper Body Dressing: with set-up;sitting Pt Will Perform Lower Body Dressing: with supervision;sit to/from stand Pt Will Transfer to Toilet: with supervision;ambulating;bedside commode Pt Will Perform Toileting - Clothing Manipulation and hygiene: with supervision;sit to/from stand Pt Will Perform Tub/Shower Transfer: Tub transfer;Shower transfer;3 in Energy manager will Perform Home Exercise Program: Increased ROM;Increased strength;Both right and left upper extremity;Independently Additional ADL  Goal #1: Patient will maintain static sitting balance at EOB for 15 min with supervision A in prep for ADLs.  Plan Discharge plan remains appropriate    Co-evaluation    PT/OT/SLP Co-Evaluation/Treatment: Yes Reason for Co-Treatment: For patient/therapist safety;To address functional/ADL transfers PT goals addressed during session: Mobility/safety with mobility OT goals addressed during session: ADL's and self-care      AM-PAC OT "6 Clicks" Daily Activity     Outcome Measure   Help from another person eating meals?: Total Help from another person taking care of personal grooming?: A Lot Help from another person toileting, which includes using toliet, bedpan, or urinal?: Total Help from another person bathing (including washing, rinsing, drying)?: Total Help from another person to put on and taking off regular upper body clothing?: A Lot Help from another person to put on and taking off regular lower body clothing?: Total 6 Click Score: 8    End of Session Equipment Utilized During Treatment: Oxygen (intubated)  OT Visit Diagnosis: Unsteadiness on feet (R26.81);Muscle weakness (generalized) (M62.81);Pain Pain - part of body:  (abdomen)   Activity Tolerance Patient tolerated treatment well   Patient Left in bed;with call bell/phone within reach;with bed alarm set;with nursing/sitter in room;with restraints reapplied   Nurse Communication Mobility status        Time: 7915-0569 OT Time Calculation (min): 23 min  Charges: OT General Charges $OT Visit: 1 Visit OT Treatments $Therapeutic Activity: 8-22 mins  [place], OTR/L Acute Rehab Pager: [phone] Office: [place] 01/05/2021, 12:18 PM

## 2021-01-05 NOTE — Anesthesia Preprocedure Evaluation (Addendum)
Anesthesia Evaluation  Patient identified by MRN, date of birth, ID bandGeneral Assessment Comment:Pt sedated  Reviewed: Allergy & Precautions, NPO status , Patient's Chart, lab work & pertinent test results, Unable to perform ROS - Chart review only  History of Anesthesia Complications Negative for: history of anesthetic complications  Airway Mallampati: Intubated       Dental  (+) Poor Dentition   Pulmonary sleep apnea , COPD, former smoker,  VDRF (sepsis, pneumonia): intubated   breath sounds clear to auscultation + decreased breath sounds      Cardiovascular hypertension, Pt. on medications (-) angina Rhythm:Regular Rate:Normal  12/02/2020 ECHO: EF 60-65%. LV has normal function,  no regional wall motion abnormalities. There  is moderate concentric LVH. Grade I diastolic dysfunction, no significant valvular abnormalities    Neuro/Psych  Headaches, Frontal brain tumor CVA    GI/Hepatic Neg liver ROS, GERD  Medicated and Controlled,  Endo/Other  diabetes (glu 82), Oral Hypoglycemic AgentsMorbid obesity  Renal/GU Renal InsufficiencyRenal disease     Musculoskeletal   Abdominal (+) + obese,   Peds  Hematology  (+) Blood dyscrasia (Hb 8.2), anemia ,   Anesthesia Other Findings   Reproductive/Obstetrics                            Anesthesia Physical Anesthesia Plan  ASA: 4  Anesthesia Plan: General   Post-op Pain Management:    Induction: Inhalational  PONV Risk Score and Plan: 2 and Ondansetron and Dexamethasone  Airway Management Planned: Tracheostomy and Oral ETT  Additional Equipment: None  Intra-op Plan:   Post-operative Plan: Post-operative intubation/ventilation  Informed Consent: I have reviewed the patients History and Physical, chart, labs and discussed the procedure including the risks, benefits and alternatives for the proposed anesthesia with the patient or authorized  representative who has indicated his/her understanding and acceptance.     Consent reviewed with POA and History available from chart only  Plan Discussed with: CRNA and Surgeon  Anesthesia Plan Comments: (Discussed with patient's wife by telephone)       Anesthesia Quick Evaluation

## 2021-01-05 NOTE — Op Note (Signed)
01/05/2021 1:47 PM   PATIENT:  Thomas [name], 65 y.o. male  PRE-OPERATIVE DIAGNOSIS:  VENTILATOR DEPENDENT  POST-OPERATIVE DIAGNOSIS:  VENTILATOR DEPENDENT   PROCEDURE:  Procedure(s): TRACHEOSTOMY, revision/formalization  SURGEON:  Surgeon(s): Beckie Salts, MD  ASSISTANTS: RNFA  ANESTHESIA:   general  EBL: Minimal   DRAINS: none   LOCAL MEDICATIONS USED:  NONE  COUNTS CORRECT:  YES  PROCEDURE DETAILS: Patient was taken to the operating room and placed on the operating table in the supine position. A shoulder roll was placed for positioning. The patient was previously orally intubated. The neck was prepped and draped in a standard fashion. A vertical incision was created just above and below the prior tracheostomy site.  Dissection continued down exposing the trachea and the previous tracheostomy opening.  This was further dissected and exposed and enlarged using tracheostomy dilator.  Initially attempt was made to place a #7 extra long but ultimately #6 XLT was placed. The orotracheal tube was removed. The # cuff was inflated. The shield was secured to the neck using a Velcro straps and nylon suture. The patient was then transferred back to the intensive care unit in critical condition.  PLAN OF CARE: Transfer to ICU  PATIENT DISPOSITION:  ICU - hemodynamically stable.

## 2021-01-06 ENCOUNTER — Other Ambulatory Visit (HOSPITAL_COMMUNITY): Payer: Self-pay

## 2021-01-06 ENCOUNTER — Encounter (HOSPITAL_COMMUNITY): Payer: Self-pay | Admitting: Otolaryngology

## 2021-01-06 DIAGNOSIS — E1100 Type 2 diabetes mellitus with hyperosmolarity without nonketotic hyperglycemic-hyperosmolar coma (NKHHC): Secondary | ICD-10-CM | POA: Diagnosis not present

## 2021-01-06 DIAGNOSIS — Z930 Tracheostomy status: Secondary | ICD-10-CM

## 2021-01-06 LAB — CBC
HCT: 24.1 % — ABNORMAL LOW (ref 39.0–52.0)
HCT: 23.9 % — ABNORMAL LOW (ref 39.0–52.0)
Hemoglobin: 8.2 g/dL — ABNORMAL LOW (ref 13.0–17.0)
Hemoglobin: 7.9 g/dL — ABNORMAL LOW (ref 13.0–17.0)
MCH: 29.6 pg (ref 26.0–34.0)
MCH: 29.2 pg (ref 26.0–34.0)
MCHC: 34 g/dL (ref 30.0–36.0)
MCHC: 33.1 g/dL (ref 30.0–36.0)
MCV: 87 fL (ref 80.0–100.0)
MCV: 88.2 fL (ref 80.0–100.0)
Platelets: 274 10*3/uL (ref 150–400)
Platelets: 287 10*3/uL (ref 150–400)
RBC: 2.77 MIL/uL — ABNORMAL LOW (ref 4.22–5.81)
RBC: 2.71 MIL/uL — ABNORMAL LOW (ref 4.22–5.81)
RDW: 17.4 % — ABNORMAL HIGH (ref 11.5–15.5)
RDW: 17.4 % — ABNORMAL HIGH (ref 11.5–15.5)
WBC: 13.3 10*3/uL — ABNORMAL HIGH (ref 4.0–10.5)
WBC: 12 10*3/uL — ABNORMAL HIGH (ref 4.0–10.5)
nRBC: 0.2 % (ref 0.0–0.2)
nRBC: 0 % (ref 0.0–0.2)

## 2021-01-06 LAB — MAGNESIUM: Magnesium: 1.9 mg/dL (ref 1.7–2.4)

## 2021-01-06 LAB — RENAL FUNCTION PANEL
Albumin: 1.9 g/dL — ABNORMAL LOW (ref 3.5–5.0)
Anion gap: 19 — ABNORMAL HIGH (ref 5–15)
BUN: 57 mg/dL — ABNORMAL HIGH (ref 8–23)
CO2: 17 mmol/L — ABNORMAL LOW (ref 22–32)
Calcium: 9.7 mg/dL (ref 8.9–10.3)
Chloride: 98 mmol/L (ref 98–111)
Creatinine, Ser: 4.88 mg/dL — ABNORMAL HIGH (ref 0.61–1.24)
GFR, Estimated: 13 mL/min — ABNORMAL LOW (ref >60)
Glucose, Bld: 148 mg/dL — ABNORMAL HIGH (ref 70–99)
Phosphorus: 8 mg/dL — ABNORMAL HIGH (ref 2.5–4.6)
Potassium: 3.5 mmol/L (ref 3.5–5.1)
Sodium: 134 mmol/L — ABNORMAL LOW (ref 135–145)

## 2021-01-06 LAB — GLUCOSE, CAPILLARY
Glucose-Capillary: 104 mg/dL — ABNORMAL HIGH (ref 70–99)
Glucose-Capillary: 126 mg/dL — ABNORMAL HIGH (ref 70–99)
Glucose-Capillary: 127 mg/dL — ABNORMAL HIGH (ref 70–99)
Glucose-Capillary: 138 mg/dL — ABNORMAL HIGH (ref 70–99)
Glucose-Capillary: 141 mg/dL — ABNORMAL HIGH (ref 70–99)
Glucose-Capillary: 89 mg/dL (ref 70–99)
Glucose-Capillary: 99 mg/dL (ref 70–99)

## 2021-01-06 LAB — HEPARIN LEVEL (UNFRACTIONATED)
Heparin Unfractionated: 0.16 IU/mL — ABNORMAL LOW (ref 0.30–0.70)
Heparin Unfractionated: 0.3 IU/mL (ref 0.30–0.70)

## 2021-01-06 LAB — PHOSPHORUS: Phosphorus: 8.4 mg/dL — ABNORMAL HIGH (ref 2.5–4.6)

## 2021-01-06 LAB — LACTIC ACID, PLASMA: Lactic Acid, Venous: 1.4 mmol/L (ref 0.5–1.9)

## 2021-01-06 LAB — BETA-HYDROXYBUTYRIC ACID: Beta-Hydroxybutyric Acid: 0.39 mmol/L — ABNORMAL HIGH (ref 0.05–0.27)

## 2021-01-06 MED ORDER — GABAPENTIN 250 MG/5ML PO SOLN
100.0000 mg | ORAL | Status: DC
  Administered 2021-01-06 – 2021-01-08 (×2): 100 mg
  Filled 2021-01-06 (×3): qty 2

## 2021-01-06 MED ORDER — GABAPENTIN 250 MG/5ML PO SOLN
100.0000 mg | Freq: Every day | ORAL | Status: DC
  Administered 2021-01-06 – 2021-01-10 (×5): 100 mg
  Filled 2021-01-06 (×5): qty 2

## 2021-01-06 MED ORDER — OXYCODONE HCL 5 MG PO TABS
5.0000 mg | ORAL_TABLET | Freq: Four times a day (QID) | ORAL | Status: DC | PRN
  Administered 2021-01-07: 5 mg via ORAL
  Filled 2021-01-06: qty 1

## 2021-01-06 NOTE — Progress Notes (Signed)
ANTICOAGULATION CONSULT NOTE - Follow Consult  Pharmacy Consult for: heparin infusion Indication: RLE DVT 8/5  No Known Allergies  Patient Measurements: Height: 5\' 8"  (172.7 cm) Weight: 104.9 kg (231 lb 4.2 oz) IBW/kg (Calculated) : 68.4 Heparin Dosing Weight: 92 kg  Vital Signs: Temp: 97.6 F (36.4 C) (08/30 2000) Temp Source: Axillary (08/30 2000) BP: 128/71 (08/30 1900) Pulse Rate: 87 (08/30 1900)  Labs: Recent Labs    01/04/21 0016 01/04/21 0706 01/04/21 1458 01/05/21 0306 01/06/21 0511 01/06/21 2030  HGB 8.4*  --   --  8.2* 8.2*  --   HCT 24.9*  --   --  24.4* 24.1*  --   PLT 250  --   --  305 274  --   HEPARINUNFRC  --    < > 0.32  --  0.16* 0.30  CREATININE 2.71*  --   --  3.85* 4.88*  --    < > = values in this interval not displayed.     Estimated Creatinine Clearance: 18 mL/min (A) (by C-G formula based on SCr of 4.88 mg/dL (H)).   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   Dizziness    Dyspnea    pt states d/t weight   Epilepsy (HCC)    takes Keppra daily   GERD (gastroesophageal reflux disease)    takes Omeprazole daily   Headache    Hyperlipidemia    takes Atorvastatin daily   Hypertension    takes Lotrel daily   Peripheral edema    takes Lasix daily   Peripheral neuropathy    takes Gabapentin daily   Pneumonia 3 yrs ago   hx of   Seizures (HCC)    Sleep apnea    Urinary frequency     Medications:  Infusions:   sodium chloride Stopped (12/20/20 0955)   feeding supplement (NEPRO CARB STEADY) 1,000 mL (01/06/21 1845)   fentaNYL infusion INTRAVENOUS Stopped (01/06/21 1451)   heparin 1,350 Units/hr (01/06/21  2000)    HPI/CC:  65 yo M presenting with potentially sepsis/ chemotherapy reaction.    Assessment: Transitioned from CRRT to [facility]. Diagnosed with RLE DVT on 8/5. Started on apixaban 5mg  BID. Last dose 8/25 AM.  Heparin drip started in the meantime now s/p  trach placement on Mon 8/29 and trach revision -heparin level = 0.3 on 1340 units/hr    Goal of Therapy:  Heparin level 0.3-0.7 units/ml Monitor platelets by anticoagulation protocol: Yes   Plan:  -Increase heparin to 1450 units/ht to keep in goal -Daily heparin level and CBC  Hildred Laser, PharmD Clinical Pharmacist **Pharmacist phone directory can now be found on [email or web address] ([email or web address]).  Listed under [place].

## 2021-01-06 NOTE — Progress Notes (Signed)
ANTICOAGULATION CONSULT NOTE - Follow Consult  Pharmacy Consult for: heparin infusion Indication: RLE DVT 8/5  No Known Allergies  Patient Measurements: Height: 5\' 8"  (172.7 cm) Weight: 104.9 kg (231 lb 4.2 oz) IBW/kg (Calculated) : 68.4 Heparin Dosing Weight: 92 kg  Vital Signs: Temp: 97.9 F (36.6 C) (08/30 0352) Temp Source: Axillary (08/30 0352) BP: 131/71 (08/30 0500) Pulse Rate: 81 (08/30 0500)  Labs: Recent Labs    01/03/21 1015 01/03/21 2054 01/04/21 0016 01/04/21 0706 01/04/21 1458 01/05/21 0306 01/06/21 0511  HGB  --    < > 8.4*  --   --  8.2* 8.2*  HCT  --   --  24.9*  --   --  24.4* 24.1*  PLT  --   --  250  --   --  305 274  APTT 110*  --   --   --   --   --   --   HEPARINUNFRC 0.43   < >  --  0.40 0.32  --  0.16*  CREATININE  --   --  2.71*  --   --  3.85* 4.88*   < > = values in this interval not displayed.     Estimated Creatinine Clearance: 18 mL/min (A) (by C-G formula based on SCr of 4.88 mg/dL (H)).   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   Dizziness    Dyspnea    pt states d/t weight   Epilepsy (HCC)    takes Keppra daily   GERD (gastroesophageal reflux disease)    takes Omeprazole daily   Headache    Hyperlipidemia    takes Atorvastatin daily   Hypertension    takes Lotrel daily   Peripheral edema    takes Lasix daily   Peripheral neuropathy    takes Gabapentin daily   Pneumonia 3 yrs ago   hx of   Seizures (HCC)    Sleep apnea    Urinary frequency     Medications:  Infusions:   sodium chloride Stopped (12/20/20 0955)   feeding supplement (NEPRO CARB STEADY) 1,000 mL (01/05/21 2231)   fentaNYL  infusion INTRAVENOUS 100 mcg/hr (01/06/21 0500)   heparin 1,250 Units/hr (01/06/21 0520)    HPI/CC:  65 yo M presenting with potentially sepsis/ chemotherapy reaction.    Assessment: Transitioned from CRRT to [facility]. Diagnosed with RLE DVT on 8/5. Started on apixaban 5mg  BID. Last dose 8/25 AM.  Heparin drip started in the meantime in anticipation of trach placement on Mon 8/29.   Heparin held for trach revision today, to restart tonight at 1900 per CCM. Previously therapeutic on 1250 units/h.  8/30 AM update:  Heparin level low after re-start Hgb low but stable  Goal of Therapy:  Heparin level 0.3-0.7 units/ml Monitor platelets by anticoagulation protocol: Yes   Plan:  Inc heparin to 1350 units/hr 1400 heparin level  Narda Bonds, PharmD, BCPS Clinical Pharmacist Phone: [phone]

## 2021-01-06 NOTE — Progress Notes (Signed)
Patient ID: HERMES WAFER, male   DOB: Dec 12, 1955, 65 y.o.   MRN: [id number] S: S/p new trach placement yesteday. O:BP (!) 145/81   Pulse 85   Temp 97.8 F (36.6 C) (Axillary)   Resp (!) 25   Ht 5' 8"  (1.727 m)   Wt 104.9 kg   SpO2 100%   BMI 35.16 kg/m   Intake/Output Summary (Last 24 hours) at 01/06/2021 1046 Last data filed at 01/06/2021 0900 Gross per 24 hour  Intake 2128.15 ml  Output 950 ml  Net 1178.15 ml   Intake/Output: I/O last 3 completed shifts: In: 2777.8 [I.V.:770.8; NG/GT:2007] Out: 1700 [Urine:450; Stool:1250]  Intake/Output this shift:  Total I/O In: 151.1 [I.V.:41.1; NG/GT:110] Out: -  Weight change: -1.2 kg Gen:on vent via trach, awake and responsive, no complaints. CVS:RRR Resp: ventilated BS bilaterally Abd: +BS, soft, NT/ND Ext: no edema  Recent Labs  Lab 12/31/20 0431 01/01/21 0410 01/01/21 1430 01/02/21 0604 01/03/21 0129 01/04/21 0016 01/05/21 0306 01/06/21 0511  NA 131* 129* 130* 131* 129*  131* 132* 133* 134*  K 4.3 4.9 4.8 4.0 3.9  3.5 3.3* 3.8 3.5  CL 96* 94*  --  95* 95*  99 93* 100 98  CO2 23 21*  --  24 21*  20* 24 19* 17*  GLUCOSE 173* 110*  --  131* 108*  97 128* 87 148*  BUN 46* 59*  --  34* 48*  45* 23 43* 57*  CREATININE 4.44* 5.22*  --  3.57* 4.55*  3.97* 2.71* 3.85* 4.88*  ALBUMIN 1.7* 1.7*  --  1.9* 1.8*  1.6* 1.9* 1.9* 1.9*  CALCIUM 8.2* 8.5*  --  8.5* 8.7*  7.8* 8.6* 8.8* 9.7  PHOS 6.2* 7.4*  --  5.2* 6.0* 4.3 5.4*  5.6* 8.0*  AST  --   --   --   --  13*  --   --   --   ALT  --   --   --   --  11  --   --   --    Liver Function Tests: Recent Labs  Lab 01/03/21 0129 01/04/21 0016 01/05/21 0306 01/06/21 0511  AST 13*  --   --   --   ALT 11  --   --   --   ALKPHOS 93  --   --   --   BILITOT 0.7  --   --   --   PROT 6.5  --   --   --   ALBUMIN 1.8*  1.6* 1.9* 1.9* 1.9*   No results for input(s): LIPASE, AMYLASE in the last 168 hours. No results for input(s): AMMONIA in the last 168  hours. CBC: Recent Labs  Lab 01/02/21 0604 01/03/21 0129 01/04/21 0016 01/05/21 0306 01/06/21 0511  WBC 16.1* 14.9* 13.9* 14.3* 13.3*  NEUTROABS  --  10.5*  --   --   --   HGB 7.3* 6.9* 8.4* 8.2* 8.2*  HCT 22.4* 20.4* 24.9* 24.4* 24.1*  MCV 89.2 87.6 85.9 86.5 87.0  PLT 254 236 250 305 274   Cardiac Enzymes: No results for input(s): CKTOTAL, CKMB, CKMBINDEX, TROPONINI in the last 168 hours. CBG: Recent Labs  Lab 01/05/21 1511 01/05/21 1925 01/05/21 2345 01/06/21 0332 01/06/21 0722  GLUCAP 141* 108* 130* 104* 127*    Iron Studies: No results for input(s): IRON, TIBC, TRANSFERRIN, FERRITIN in the last 72 hours. Studies/Results: No results found.  acetaminophen  1,000 mg Per Tube Q8H  vitamin C  500 mg Per Tube TID   atorvastatin  20 mg Per Tube Daily   chlorhexidine gluconate (MEDLINE KIT)  15 mL Mouth Rinse BID   Chlorhexidine Gluconate Cloth  6 each Topical Daily   clonazepam  1 mg Per Tube Q6H   feeding supplement (PROSource TF)  45 mL Per Tube BID   fiber  1 packet Per Tube BID   gabapentin  100 mg Per Tube Daily   And   gabapentin  100 mg Per Tube Once per day on Tue Thu Sat   glycopyrrolate  1 mg Per Tube BID   hydrocerin   Topical BID   insulin aspart  0-15 Units Subcutaneous Q4H   insulin aspart  4 Units Subcutaneous Q4H   insulin detemir  15 Units Subcutaneous BID   levETIRAcetam  500 mg Per Tube BID   mouth rinse  15 mL Mouth Rinse 10 times per day   multivitamin  1 tablet Per Tube QHS   pantoprazole sodium  40 mg Per Tube BID   QUEtiapine  100 mg Per Tube BID   thiamine  100 mg Per Tube Daily    BMET    Component Value Date/Time   NA 134 (L) 01/06/2021 0511   K 3.5 01/06/2021 0511   CL 98 01/06/2021 0511   CO2 17 (L) 01/06/2021 0511   GLUCOSE 148 (H) 01/06/2021 0511   BUN 57 (H) 01/06/2021 0511   CREATININE 4.88 (H) 01/06/2021 0511   CREATININE 1.83 (H) 11/19/2020 0914   CALCIUM 9.7 01/06/2021 0511   GFRNONAA 13 (L) 01/06/2021 0511    GFRNONAA 41 (L) 11/19/2020 0914   GFRAA >60 04/01/2017 1921   CBC    Component Value Date/Time   WBC 13.3 (H) 01/06/2021 0511   RBC 2.77 (L) 01/06/2021 0511   HGB 8.2 (L) 01/06/2021 0511   HGB 11.2 (L) 11/19/2020 0914   HCT 24.1 (L) 01/06/2021 0511   PLT 274 01/06/2021 0511   PLT 212 11/19/2020 0914   MCV 87.0 01/06/2021 0511   MCH 29.6 01/06/2021 0511   MCHC 34.0 01/06/2021 0511   RDW 17.4 (H) 01/06/2021 0511   LYMPHSABS 1.8 01/03/2021 0129   MONOABS 1.7 (H) 01/03/2021 0129   EOSABS 0.5 01/03/2021 0129   BASOSABS 0.1 01/03/2021 0129    Assessment/Plan:   Dialysis-dependent AKI/CKD stage IIIb - presumably ischemic ATN in setting of severe sepsis and MSOF and remains oliguric.  CRRT initiated 7/31 and transitioned to IHD on 12/19/20.  S/p [facility] placement 12/22/20.  Continue with TTS schedule for now. Anasarca - improved with HD AMS - h/o CVA and seizure disorder. Acute hypoxic respiratory failure/VDRF - aspiration pneumonia.  s/p tracheostomy, however pt removed it on 01/01/21 s/p intubation.  New tracheostomy created 01/05/21 by ENT.  Right leg DVT - now on argatroban. Thrombocytopenia - presumably due to critical illness.  Hematology following and HITT panel pending.  Heparin stopped 12/12/20 after starting argatroban for RLE DVT.  Platelets improved slightly to 94.   Metabolic acidosis - nongap.  Currently on IHD. Colon cancer stage IIIB - s/p partial colectomy, colostomy.  S/p 3 cycles of chemo.   Atrial fibrillation -on amiodarone and rate controlled. Anemia of critical illness- transfuse prn. Acute metabolic encephalopathy - on fent gtt, zygpreza, and changing to precedex gtt. Hypophosphatemia - improved after IV repletion.  Continue to follow.  Disposition - poor overall prognosis.  He is not suitable for outpatient HD, doubt he will be candidate for LTC  facility.  Palliative care consult (initially seen 12/08/20).  Unfortunately full code for now.  Donetta Potts,  MD [employer] [phone]

## 2021-01-06 NOTE — Progress Notes (Addendum)
NAME:  Thomas [name], MRN:  [id number], DOB:  1955/08/07, LOS: 73 ADMISSION DATE:  11/30/2020, CONSULTATION DATE:  7/30 REFERRING MD:  Riccardo Dubin, CHIEF COMPLAINT:  Worsening hypoxia/dyspnea  History of Present Illness:  65 yo M admitted 7/24 for worsening sepsis vs reaction to chemotherapy with high ostomy output and soft BP on 7/25. Has had issues during his hospitalization with recurrent aspirations and more recently worsening respiratory status.  Most recently pt developed worsening hypoxia and was requiring BIPAP w/ FIO2 of 100% and increased work of breathing.   He was admitted to intensive care and subsequently had worsening clinical status requiring intubation, CRRT, multiple pressors and pneumonia along with CT findings 8/1 concerning for ischemic bowel.  Significant Hospital Events:  7/27 Transferred to [facility], stopped abx as sepsis resolved/ruled out 7/30 AM Worsened AME, more hypoxic with worsening renal funciton, new aspiration on abx restarted 7/30 PM Pt with increased o2 requirement on 100% FIO2 on BIPAP, d/w family who wanted to proceed with intubation, pt intubated 7/31 Tracheal aspirate grew moderate candida tropicalis  8/1 Some issues with tachycardia and hypotension overnight. Reproted high ostomy output.  GI PCR negative 8/2 down to 9 mcg of Levophed, however repeat CT abdomen/pelvis showed inflammatory wall thickening and fat stranding of the descending and sigmoid colon, new since prior, consistent with nonspecific infectious, inflammatory, or ischemic colitis.  Also new dense consolidative airspace opacity in the right lower and right middle lobes 8/3 further weaning NE. Weaning sedation. Full code, full scope per family meeting 8/2. Eraxis stopped (8/1-8/3) 8/4 off pressors, plt down to 103.  8/5 remains off pressors. Moving around more, not really following commands though  8/9 failed extubation 8/8, tolerated nasal cannula for about an hour, desaturations and inability to  clear secretions-reintubated 8/8 8/10 some agitation overnight 8/11 tracheostomy placement On trach collar since 8/16 8/22 pulled trach out, replaced under bronch guidance. Has proximal area of granulation tissue that is above the bottom of the trach tube.  8/25 Removed Trach, unable to reinsert, despite multiple attempts re-intubated 8/29 Anemia, 1 unit PRBC   Interim History / Subjective:  Afebrile / WBC 14.3  Vent - 30% fiO2, PEEP 5 Glucose range 82-100 I/O +1L in last 24 hours  Family at bedside  Objective   Blood pressure (!) 145/81, pulse 85, temperature 97.8 F (36.6 C), temperature source Axillary, resp. rate (!) 25, height 5\' 8"  (1.727 m), weight 104.9 kg, SpO2 100 %.    Vent Mode: PRVC FiO2 (%):  [30 %] 30 % Set Rate:  [15 bmp] 15 bmp Vt Set:  [540 mL] 540 mL PEEP:  [5 cmH20] 5 cmH20 Plateau Pressure:  [17 cmH20-22 cmH20] 17 cmH20   Intake/Output Summary (Last 24 hours) at 01/06/2021 0954 Last data filed at 01/06/2021 0900 Gross per 24 hour  Intake 2148.22 ml  Output 950 ml  Net 1198.22 ml    Filed Weights   01/04/21 0432 01/05/21 0500 01/06/21 0500  Weight: 104.6 kg 106.1 kg 104.9 kg     Exam: General: overweight male on vent via trach in distress due to back pain HEENT: [place]/AT, PERRL, no JVD Neuro: Spontaneously awake, interactive, following commands CV: RRR, no MRG PULM: clear bilateral breath sounds GI: soft, hypoactive, mild distention  Extremities: no acute deformity ROM limitation, no edme.  Skin: no rashes or lesions. R chest wall port-a-cath, left tunneled HD cath   Resolved Hospital Problem list   Shock  HHS Afib RVR  Lactic Acidosis  Thrombocytopenia  Colitis  Assessment & Plan:   Acute Hypoxemic Respiratory Failure s/p Tracheostomy  Endotracheal Granulation Tissue  Aspiration PNA  OSA  Failed prior extubation due to secretions. Lurline Idol 8/11.  Pt removed trach 8/25, replaced at bedside but ultimately required intubation from above.   Pseudomonas in tracheal aspirate 8/15. Re-do trach 8/29 by ENT.  -SBT this morning -Goal trach collar -Appreciate ENT assistance with patient care  -follow CXR   AKI on CKD IIIb Hyponatremia  Suspect ischemic ATN in setting of sepsis, MSOF. Required CRRT, initiated 7/31 with transition to IHD 8/12, tunnled cath placed 8/15.   -appreciate Nephrology assistance > deemed not suitable for outpatient HD -HD per Nephrology T/Th/S  -Trend BMP / urinary output -Replace electrolytes as indicated  Diarrhea Contributing to hyponatremia.  Volume average 750-1.1L in last 4 days.  -PRN imodium  -replacement IVF as needed  -fiber PT   Elevated anion gap: uremia? - check lactic, beta-hydroxybutyrate  - For HD today.   Colorectal cancer Completed chemotherapy in July 2022. Followed by Dr. Benay Spice. -Per ONC   LE DVT -heparin infusion per pharmacy  DM  -SSI, moderate scale -TF coverage 4 units Q4 -levemir 15 units BID   Epilepsy -continue keppra   GOC: -seen by palliative care -Full code   Chronic pain: seems to have some neuropathic back/leg pain treated at home with gabapentin - PRN tylenol - Will restart home gabapentin, appreciate pharmacy assistance for renal/HD dosing - Oxycodone PRN to facilitate fentanyl wean > trach collar tolerance.    Best Practice  DVT: full dose anticoagulation  SUP: PPI Nutrition: TF Family: Wife and daughter updated bedside.   Critical Care Time: 38 minutes    Georgann Housekeeper, AGACNP-BC [facility]  See Amion for personal pager PCCM on call pager [phone] until 7pm. Please call Elink 7p-7a. [phone]  01/06/2021 10:21 AM

## 2021-01-06 NOTE — Progress Notes (Signed)
Trach change noted on worklist not done at this time due to patient has fresh trach placed on 01/05/21.

## 2021-01-07 DIAGNOSIS — E1100 Type 2 diabetes mellitus with hyperosmolarity without nonketotic hyperglycemic-hyperosmolar coma (NKHHC): Secondary | ICD-10-CM | POA: Diagnosis not present

## 2021-01-07 LAB — CBC
HCT: 24.5 % — ABNORMAL LOW (ref 39.0–52.0)
Hemoglobin: 8.3 g/dL — ABNORMAL LOW (ref 13.0–17.0)
MCH: 29.1 pg (ref 26.0–34.0)
MCHC: 33.9 g/dL (ref 30.0–36.0)
MCV: 86 fL (ref 80.0–100.0)
Platelets: 332 10*3/uL (ref 150–400)
RBC: 2.85 MIL/uL — ABNORMAL LOW (ref 4.22–5.81)
RDW: 17.4 % — ABNORMAL HIGH (ref 11.5–15.5)
WBC: 10.8 10*3/uL — ABNORMAL HIGH (ref 4.0–10.5)
nRBC: 0 % (ref 0.0–0.2)

## 2021-01-07 LAB — RENAL FUNCTION PANEL
Albumin: 1.8 g/dL — ABNORMAL LOW (ref 3.5–5.0)
Anion gap: 17 — ABNORMAL HIGH (ref 5–15)
BUN: 53 mg/dL — ABNORMAL HIGH (ref 8–23)
CO2: 17 mmol/L — ABNORMAL LOW (ref 22–32)
Calcium: 8.5 mg/dL — ABNORMAL LOW (ref 8.9–10.3)
Chloride: 101 mmol/L (ref 98–111)
Creatinine, Ser: 4.08 mg/dL — ABNORMAL HIGH (ref 0.61–1.24)
GFR, Estimated: 16 mL/min — ABNORMAL LOW (ref >60)
Glucose, Bld: 127 mg/dL — ABNORMAL HIGH (ref 70–99)
Phosphorus: 6.9 mg/dL — ABNORMAL HIGH (ref 2.5–4.6)
Potassium: 3.6 mmol/L (ref 3.5–5.1)
Sodium: 135 mmol/L (ref 135–145)

## 2021-01-07 LAB — BASIC METABOLIC PANEL
Anion gap: 11 (ref 5–15)
Anion gap: 14 (ref 5–15)
BUN: 25 mg/dL — ABNORMAL HIGH (ref 8–23)
BUN: 33 mg/dL — ABNORMAL HIGH (ref 8–23)
CO2: 23 mmol/L (ref 22–32)
CO2: 22 mmol/L (ref 22–32)
Calcium: 7.8 mg/dL — ABNORMAL LOW (ref 8.9–10.3)
Calcium: 8.2 mg/dL — ABNORMAL LOW (ref 8.9–10.3)
Chloride: 102 mmol/L (ref 98–111)
Chloride: 102 mmol/L (ref 98–111)
Creatinine, Ser: 2.49 mg/dL — ABNORMAL HIGH (ref 0.61–1.24)
Creatinine, Ser: 2.95 mg/dL — ABNORMAL HIGH (ref 0.61–1.24)
GFR, Estimated: 28 mL/min — ABNORMAL LOW (ref >60)
GFR, Estimated: 23 mL/min — ABNORMAL LOW (ref >60)
Glucose, Bld: 123 mg/dL — ABNORMAL HIGH (ref 70–99)
Glucose, Bld: 110 mg/dL — ABNORMAL HIGH (ref 70–99)
Potassium: 3.1 mmol/L — ABNORMAL LOW (ref 3.5–5.1)
Potassium: 3.2 mmol/L — ABNORMAL LOW (ref 3.5–5.1)
Sodium: 136 mmol/L (ref 135–145)
Sodium: 138 mmol/L (ref 135–145)

## 2021-01-07 LAB — GLUCOSE, CAPILLARY
Glucose-Capillary: 131 mg/dL — ABNORMAL HIGH (ref 70–99)
Glucose-Capillary: 113 mg/dL — ABNORMAL HIGH (ref 70–99)
Glucose-Capillary: 126 mg/dL — ABNORMAL HIGH (ref 70–99)
Glucose-Capillary: 101 mg/dL — ABNORMAL HIGH (ref 70–99)
Glucose-Capillary: 100 mg/dL — ABNORMAL HIGH (ref 70–99)
Glucose-Capillary: 131 mg/dL — ABNORMAL HIGH (ref 70–99)

## 2021-01-07 LAB — IRON AND TIBC
Iron: 33 ug/dL — ABNORMAL LOW (ref 45–182)
Saturation Ratios: 14 % — ABNORMAL LOW (ref 17.9–39.5)
TIBC: 239 ug/dL — ABNORMAL LOW (ref 250–450)
UIBC: 206 ug/dL

## 2021-01-07 LAB — HEPARIN LEVEL (UNFRACTIONATED): Heparin Unfractionated: 0.43 IU/mL (ref 0.30–0.70)

## 2021-01-07 MED ORDER — APIXABAN 5 MG PO TABS
5.0000 mg | ORAL_TABLET | Freq: Two times a day (BID) | ORAL | Status: DC
  Administered 2021-01-07 – 2021-01-08 (×3): 5 mg via ORAL
  Filled 2021-01-07 (×3): qty 1

## 2021-01-07 MED ORDER — POTASSIUM CHLORIDE 20 MEQ PO PACK
20.0000 meq | PACK | Freq: Once | ORAL | Status: AC
  Administered 2021-01-07: 20 meq via ORAL
  Filled 2021-01-07: qty 1

## 2021-01-07 MED ORDER — CLONAZEPAM 0.5 MG PO TBDP
1.0000 mg | ORAL_TABLET | Freq: Two times a day (BID) | ORAL | Status: DC
  Administered 2021-01-07 – 2021-01-08 (×3): 1 mg
  Filled 2021-01-07 (×3): qty 2

## 2021-01-07 MED ORDER — DEXMEDETOMIDINE HCL IN NACL 400 MCG/100ML IV SOLN
0.2000 ug/kg/h | INTRAVENOUS | Status: DC
Start: 2021-01-07 — End: 2021-01-07
  Administered 2021-01-07: 0.4 ug/kg/h via INTRAVENOUS
  Filled 2021-01-07: qty 100

## 2021-01-07 MED ORDER — OXYCODONE HCL 5 MG PO TABS
5.0000 mg | ORAL_TABLET | Freq: Four times a day (QID) | ORAL | Status: DC | PRN
  Administered 2021-01-07 – 2021-01-14 (×12): 5 mg
  Filled 2021-01-07 (×13): qty 1

## 2021-01-07 NOTE — Progress Notes (Signed)
eLink Physician-Brief Progress Note Patient Name: Thomas [name] DOB: 1955/06/13 MRN: [id number]   Date of Service  01/07/2021  HPI/Events of Note  Patient needs restraints orders removed to keep him from pulling at his tubes and devices.  eICU Interventions  Bilateral soft wrist restraints renewed.        Kerry Kass Kelin Nixon 01/07/2021, 9:20 PM

## 2021-01-07 NOTE — Progress Notes (Signed)
NAME:  Thomas [name], MRN:  [id number], DOB:  [birth year], LOS: 59 ADMISSION DATE:  11/30/2020, CONSULTATION DATE:  7/30 REFERRING MD:  Riccardo Dubin, CHIEF COMPLAINT:  Worsening hypoxia/dyspnea  History of Present Illness:  65 yo M admitted 7/24 for worsening sepsis vs reaction to chemotherapy with high ostomy output and soft BP on 7/25. Has had issues during his hospitalization with recurrent aspirations and more recently worsening respiratory status.  Most recently pt developed worsening hypoxia and was requiring BIPAP w/ FIO2 of 100% and increased work of breathing.   He was admitted to intensive care and subsequently had worsening clinical status requiring intubation, CRRT, multiple pressors and pneumonia along with CT findings 8/1 concerning for ischemic bowel.  Significant Hospital Events:  7/27 Transferred to [facility], stopped abx as sepsis resolved/ruled out 7/30 AM Worsened AME, more hypoxic with worsening renal funciton, new aspiration on abx restarted 7/30 PM Pt with increased o2 requirement on 100% FIO2 on BIPAP, d/w family who wanted to proceed with intubation, pt intubated 7/31 Tracheal aspirate grew moderate candida tropicalis  8/1 Some issues with tachycardia and hypotension overnight. Reproted high ostomy output.  GI PCR negative 8/2 down to 9 mcg of Levophed, however repeat CT abdomen/pelvis showed inflammatory wall thickening and fat stranding of the descending and sigmoid colon, new since prior, consistent with nonspecific infectious, inflammatory, or ischemic colitis.  Also new dense consolidative airspace opacity in the right lower and right middle lobes 8/3 further weaning NE. Weaning sedation. Full code, full scope per family meeting 8/2. Eraxis stopped (8/1-8/3) 8/4 off pressors, plt down to 103.  8/5 remains off pressors. Moving around more, not really following commands though  8/9 failed extubation 8/8, tolerated nasal cannula for about an hour, desaturations and inability to  clear secretions-reintubated 8/8 8/10 some agitation overnight 8/11 tracheostomy placement On trach collar since 8/16 8/22 pulled trach out, replaced under bronch guidance. Has proximal area of granulation tissue that is above the bottom of the trach tube.  8/25 Removed Trach, unable to reinsert, despite multiple attempts re-intubated 8/29 Anemia, 1 unit PRBC   Interim History / Subjective:  Agitation overnight requiring precedex infusion.  Pain better controlled with additions of gabapentin and oxycodone.   Objective   Blood pressure 114/67, pulse 82, temperature 98.2 F (36.8 C), temperature source Axillary, resp. rate 15, height 5\' 8"  (1.727 m), weight 106.2 kg, SpO2 98 %.    Vent Mode: PRVC FiO2 (%):  [30 %] 30 % Set Rate:  [15 bmp] 15 bmp Vt Set:  [540 mL] 540 mL PEEP:  [5 cmH20] 5 cmH20 Pressure Support:  [5 cmH20-8 cmH20] 5 cmH20 Plateau Pressure:  [14 cmH20-17 cmH20] 14 cmH20   Intake/Output Summary (Last 24 hours) at 01/07/2021 0838 Last data filed at 01/07/2021 0645 Gross per 24 hour  Intake 1456.44 ml  Output 2700 ml  Net -1243.56 ml    Filed Weights   01/05/21 0500 01/06/21 0500 01/07/21 0315  Weight: 106.1 kg 104.9 kg 106.2 kg     Exam:  General: overweight elderly appearing male on vent in NAD HEENT: [place]/AT, PERRL, no JVD Neuro: Spontaneously awake, interactive, following commands CV: RRR, no MRG PULM: clear bilateral breath sounds GI: soft, hypoactive, mild distention  Extremities: no acute deformity ROM limitation, no edme.  Skin: no rashes or lesions. R chest wall port-a-cath, left tunneled HD cath   Resolved Hospital Problem list   Shock  HHS Afib RVR  Lactic Acidosis  Thrombocytopenia Colitis  Assessment & Plan:  Acute Hypoxemic Respiratory Failure s/p Tracheostomy  Endotracheal Granulation Tissue  Aspiration PNA  OSA  Failed prior extubation due to secretions. Lurline Idol 8/11.  Pt removed trach 8/25, replaced at bedside but ultimately required  intubation from above.  Pseudomonas in tracheal aspirate 8/15. Re-do trach 8/29 by ENT.  -Goal to trach collar today -Precedex infusion started overnight for agitation, wean, but continue for now. Will attempt to add enteral agents.   AKI on CKD IIIb Hyponatremia  Suspect ischemic ATN in setting of sepsis, MSOF. Required CRRT, initiated 7/31 with transition to IHD 8/12, tunnled cath placed 8/15.   -appreciate Nephrology assistance > deemed not suitable for outpatient HD -HD per Nephrology T/Th/S  -Trend BMP / urinary output -Replace electrolytes as indicated  Diarrhea Contributing to hyponatremia.  Improved.  -PRN imodium  -replacement IVF as needed  -fiber PT   Elevated anion gap: uremia? - Lactic, BHB negative - AM labs were done before HD, will check labs now to see if acidosis improved with removal of urea.   Colorectal cancer Completed chemotherapy in July 2022. Followed by Dr. Benay Spice. -Per ONC   LE DVT -heparin infusion per pharmacy -Can transition back to doac today.  DM  -SSI, moderate scale -TF coverage 4 units Q4 -levemir 15 units BID   Epilepsy -continue keppra   GOC: -seen by palliative care -Full code   Chronic pain: seems to have some neuropathic back/leg pain treated at home with gabapentin - PRN tylenol - Scheduled clonazepam - Will restart home gabapentin, appreciate pharmacy assistance for renal/HD dosing - Oxycodone PRN to facilitate fentanyl wean > trach collar tolerance.    Best Practice  DVT: full dose anticoagulation  SUP: PPI Nutrition: TF Family: Wife and daughter updated bedside 8/30  Critical Care Time: 58 min    Georgann Housekeeper, AGACNP-BC [place] for personal pager PCCM on call pager [phone] until 7pm. Please call Elink 7p-7a. [phone]  01/07/2021 8:38 AM

## 2021-01-07 NOTE — Progress Notes (Signed)
eLink Physician-Brief Progress Note Patient Name: Thomas [name] DOB: 1955/08/14 MRN: [id number]   Date of Service  01/07/2021  HPI/Events of Note  Patient with extreme anxiety not improved with PRN benzodiazepines and other measures, it's impacting his ventilation.  eICU Interventions  Precedex gtt ordered.        Kerry Kass Chaneka Trefz 01/07/2021, 6:03 AM

## 2021-01-07 NOTE — Progress Notes (Signed)
Patient ID: Thomas [name], male   DOB: Jun 26, 1955, 65 y.o.   MRN: [id number] S: Required precedex overnight due to agitation and is somnolent this am.  O:BP 114/67   Pulse 77   Temp 98.2 F (36.8 C) (Axillary)   Resp (!) 21   Ht 5' 8"  (1.727 m)   Wt 106.2 kg   SpO2 100%   BMI 35.60 kg/m   Intake/Output Summary (Last 24 hours) at 01/07/2021 0947 Last data filed at 01/07/2021 0645 Gross per 24 hour  Intake 1379.15 ml  Output 2700 ml  Net -1320.85 ml   Intake/Output: I/O last 3 completed shifts: In: 3074.2 [I.V.:619.2; NG/GT:2455] Out: 7902 [Urine:450; Other:1500; IOXBD:5329]  Intake/Output this shift:  No intake/output data recorded. Weight change: 1.3 kg Gen:on vent via trach CVS:RRR Resp:ventilated BS bilaterally Abd:+BS, soft, NT/ND Ext: no edema  Recent Labs  Lab 01/01/21 0410 01/01/21 1430 01/02/21 0604 01/03/21 0129 01/04/21 0016 01/05/21 0306 01/06/21 0511 01/06/21 1222 01/07/21 0352  NA 129* 130* 131* 129*  131* 132* 133* 134*  --  135  K 4.9 4.8 4.0 3.9  3.5 3.3* 3.8 3.5  --  3.6  CL 94*  --  95* 95*  99 93* 100 98  --  101  CO2 21*  --  24 21*  20* 24 19* 17*  --  17*  GLUCOSE 110*  --  131* 108*  97 128* 87 148*  --  127*  BUN 59*  --  34* 48*  45* 23 43* 57*  --  53*  CREATININE 5.22*  --  3.57* 4.55*  3.97* 2.71* 3.85* 4.88*  --  4.08*  ALBUMIN 1.7*  --  1.9* 1.8*  1.6* 1.9* 1.9* 1.9*  --  1.8*  CALCIUM 8.5*  --  8.5* 8.7*  7.8* 8.6* 8.8* 9.7  --  8.5*  PHOS 7.4*  --  5.2* 6.0* 4.3 5.4*  5.6* 8.0* 8.4* 6.9*  AST  --   --   --  13*  --   --   --   --   --   ALT  --   --   --  11  --   --   --   --   --    Liver Function Tests: Recent Labs  Lab 01/03/21 0129 01/04/21 0016 01/05/21 0306 01/06/21 0511 01/07/21 0352  AST 13*  --   --   --   --   ALT 11  --   --   --   --   ALKPHOS 93  --   --   --   --   BILITOT 0.7  --   --   --   --   PROT 6.5  --   --   --   --   ALBUMIN 1.8*  1.6*   < > 1.9* 1.9* 1.8*   < > = values in this  interval not displayed.   No results for input(s): LIPASE, AMYLASE in the last 168 hours. No results for input(s): AMMONIA in the last 168 hours. CBC: Recent Labs  Lab 01/03/21 0129 01/04/21 0016 01/05/21 0306 01/06/21 0511 01/06/21 2157 01/07/21 0352  WBC 14.9* 13.9* 14.3* 13.3* 12.0* 10.8*  NEUTROABS 10.5*  --   --   --   --   --   HGB 6.9* 8.4* 8.2* 8.2* 7.9* 8.3*  HCT 20.4* 24.9* 24.4* 24.1* 23.9* 24.5*  MCV 87.6 85.9 86.5 87.0 88.2 86.0  PLT 236 250 305 274  287 332   Cardiac Enzymes: No results for input(s): CKTOTAL, CKMB, CKMBINDEX, TROPONINI in the last 168 hours. CBG: Recent Labs  Lab 01/06/21 1945 01/06/21 2155 01/06/21 2345 01/07/21 0350 01/07/21 0728  GLUCAP 89 126* 141* 131* 113*    Iron Studies: No results for input(s): IRON, TIBC, TRANSFERRIN, FERRITIN in the last 72 hours. Studies/Results: No results found.  acetaminophen  1,000 mg Per Tube Q8H   apixaban  5 mg Oral BID   vitamin C  500 mg Per Tube TID   atorvastatin  20 mg Per Tube Daily   chlorhexidine gluconate (MEDLINE KIT)  15 mL Mouth Rinse BID   Chlorhexidine Gluconate Cloth  6 each Topical Daily   clonazepam  1 mg Per Tube Q6H   feeding supplement (PROSource TF)  45 mL Per Tube BID   fiber  1 packet Per Tube BID   gabapentin  100 mg Per Tube Daily   And   gabapentin  100 mg Per Tube Once per day on Tue Thu Sat   hydrocerin   Topical BID   insulin aspart  0-15 Units Subcutaneous Q4H   insulin aspart  4 Units Subcutaneous Q4H   insulin detemir  15 Units Subcutaneous BID   levETIRAcetam  500 mg Per Tube BID   mouth rinse  15 mL Mouth Rinse 10 times per day   multivitamin  1 tablet Per Tube QHS   pantoprazole sodium  40 mg Per Tube BID   QUEtiapine  100 mg Per Tube BID   thiamine  100 mg Per Tube Daily    BMET    Component Value Date/Time   NA 135 01/07/2021 0352   K 3.6 01/07/2021 0352   CL 101 01/07/2021 0352   CO2 17 (L) 01/07/2021 0352   GLUCOSE 127 (H) 01/07/2021 0352   BUN  53 (H) 01/07/2021 0352   CREATININE 4.08 (H) 01/07/2021 0352   CREATININE 1.83 (H) 11/19/2020 0914   CALCIUM 8.5 (L) 01/07/2021 0352   GFRNONAA 16 (L) 01/07/2021 0352   GFRNONAA 41 (L) 11/19/2020 0914   GFRAA >60 04/01/2017 1921   CBC    Component Value Date/Time   WBC 10.8 (H) 01/07/2021 0352   RBC 2.85 (L) 01/07/2021 0352   HGB 8.3 (L) 01/07/2021 0352   HGB 11.2 (L) 11/19/2020 0914   HCT 24.5 (L) 01/07/2021 0352   PLT 332 01/07/2021 0352   PLT 212 11/19/2020 0914   MCV 86.0 01/07/2021 0352   MCH 29.1 01/07/2021 0352   MCHC 33.9 01/07/2021 0352   RDW 17.4 (H) 01/07/2021 0352   LYMPHSABS 1.8 01/03/2021 0129   MONOABS 1.7 (H) 01/03/2021 0129   EOSABS 0.5 01/03/2021 0129   BASOSABS 0.1 01/03/2021 0129    Assessment/Plan:   Dialysis-dependent AKI/CKD stage IIIb - presumably ischemic ATN in setting of severe sepsis and MSOF and remains oliguric.  CRRT initiated 7/31 and transitioned to IHD on 12/19/20.  S/p [facility] placement 12/22/20.  Continue with TTS schedule for now. Anasarca - improved with HD AMS - h/o CVA and seizure disorder. Acute hypoxic respiratory failure/VDRF - aspiration pneumonia.  s/p tracheostomy, however pt removed it on 01/01/21 s/p intubation.  New tracheostomy created 01/05/21 by ENT.  Right leg DVT - now on argatroban. Thrombocytopenia - presumably due to critical illness.  Hematology following and HITT panel pending.  Heparin stopped 12/12/20 after starting argatroban for RLE DVT.  Platelets improved slightly to 94.   Metabolic acidosis - nongap.  Currently on IHD. Colon cancer  stage IIIB - s/p partial colectomy, colostomy.  S/p 3 cycles of chemo.   Atrial fibrillation -on amiodarone and rate controlled. Anemia of critical illness- transfuse prn. Acute metabolic encephalopathy - on fent gtt, zygpreza, and changing to precedex gtt. Hypophosphatemia - improved after IV repletion.  Continue to follow.  Disposition - poor overall prognosis.  He is not suitable for  outpatient HD, doubt he will be candidate for LTC facility.  Palliative care consult (initially seen 12/08/20).  Unfortunately full code for now.  Donetta Potts, MD [employer] [phone]

## 2021-01-07 NOTE — Progress Notes (Signed)
Occupational Therapy Treatment Patient Details Name: Thomas [name] MRN: [id number] DOB: 10/16/1955 Today's Date: 01/07/2021    History of present illness 65 y.o. male present to ED 2/24 with high ostomy output. Patient admitted with ischemic ATN in setting of severe sepsis and multisystem organ failure. Hospital admission complicated by volume overload, aspiration pneumonia, acute respiratory failure, AKI and ischemic colitis. S/p trach placement 8/11. CorTrak 8/12. HD initiated 8/12. To IR 8/15 for tunneled HD cath. Pulled out trach x2 8/22 replaced by PCCM. Pulled out trach again in 8/25 but unable to be reinserted. Transferred back to ICU. 8/29 to OR for stoma clean up and trach replacement.  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   OT comments  Patient met lying supine in bed in agreement to OT treatment session with focus on sitting balance, activity tolerance and functional transfers. Transitioned from vent via trach to trach collar earlier today. Patient tolerating trach collar throughout OT treatment session although limited by lethargy and pain at trach. Patient continues to require Max A +2 grossly for bed mobility and sit to stand transfers with increased time/effort. Did not progress to recliner secondary to pain and lethargy. Goals downgrade. OT will continue to follow acutely.    Follow Up Recommendations  SNF;Supervision/Assistance - 24 hour    Equipment Recommendations  Other (comment) (Defer to next venue)    Recommendations for Other Services      Precautions / Restrictions Precautions Precautions: Fall Precaution Comments: cortrak, R subclavian tunneled HD cath,  re trached to replace ETT Restrictions Weight Bearing Restrictions: No       Mobility Bed  Mobility Overal bed mobility: Needs Assistance Bed Mobility: Supine to Sit;Sit to Supine     Supine to sit: Max assist;+2 for safety/equipment Sit to supine: Max assist;+2 for safety/equipment   General bed mobility comments: Max A +2 for all bed mobility. Attempts to assist with advancement of BLE toward EOB and elevation of trunk.    Transfers Overall transfer level: Needs assistance Equipment used: 2 person hand held assist Transfers: Sit to/from Stand Sit to Stand: Max assist;+2 physical assistance;From elevated surface         General transfer comment: Max A +2 for sit to stand from EOB. Difficulty attaining/maintaining full upright posture.    Balance Overall balance assessment: Needs assistance Sitting-balance support: Bilateral upper extremity supported;Single extremity supported Sitting balance-Leahy Scale: Poor Sitting balance - Comments: Mod A at most and Min guard at best. Postural control: Right lateral lean   Standing balance-Leahy Scale: Zero                             ADL either performed or assessed with clinical judgement   ADL Overall ADL's : Needs assistance/impaired Eating/Feeding: NPO                                           Vision       Perception     Praxis      Cognition Arousal/Alertness: Awake/alert Behavior During Therapy: Restless Overall Cognitive Status: Difficult to assess                                   General Comments: pt follows commands with increased time and cues. Shakes head yes/no appropriately to questions.        Exercises Other Exercises Other Exercises: Sitting balance at EOB in prep for functional transfers   Shoulder Instructions       General Comments Tolerating trach collar with FiO2 35%.    Pertinent Vitals/ Pain       Pain Assessment: Faces Faces Pain Scale: Hurts little more Pain Location: abdomen and trach collar Pain Descriptors / Indicators:  Restless Pain Intervention(s): Limited activity within patient's tolerance;Monitored during session;Repositioned  Home Living                                          Prior Functioning/Environment              Frequency  Min 2X/week        Progress Toward Goals  OT Goals(current goals can now be found in the care plan section)  Progress towards OT goals: Progressing toward goals  Acute Rehab OT Goals Patient Stated Goal: to decrease pain OT Goal Formulation: With patient Time For Goal Achievement: 01/06/21 Potential to Achieve Goals: Good ADL Goals Pt Will Perform Grooming: with min assist;sitting Pt Will Perform Upper Body Dressing: with mod assist;sitting Pt Will Perform Lower Body Dressing: with max assist;sitting/lateral leans;sit to/from stand Additional ADL Goal #2: Patinet will complete sit to stand transfers with Mod A +2 in prep for ADLs. Additional ADL Goal #3: Patient will attend to 2/3 grooming tasks without cues for continuation indicating increased attention to task.  Plan Discharge plan remains appropriate    Co-evaluation    PT/OT/SLP Co-Evaluation/Treatment: Yes            AM-PAC OT "6 Clicks" Daily Activity     Outcome Measure   Help from another person eating meals?: Total Help from another person taking care of personal grooming?: A Lot Help from another person toileting, which includes using toliet, bedpan, or urinal?: Total Help from another person bathing (including washing, rinsing, drying)?: Total Help from another person to put on and taking off regular upper body clothing?: A Lot Help from another person to put on and taking off regular lower body clothing?: Total 6 Click Score: 8    End of Session Equipment Utilized During Treatment: Oxygen (intubated)  OT Visit Diagnosis: Unsteadiness on feet (R26.81);Muscle weakness (generalized) (M62.81);Pain Pain - part of body:  (abdomen and trach)   Activity Tolerance  Patient tolerated treatment well   Patient Left in bed;with call bell/phone within reach;with bed alarm set;with nursing/sitter in room;with restraints reapplied   Nurse Communication Mobility status        Time: 1137-1203 OT Time Calculation (min): 26 min  Charges: OT General Charges $OT Visit: 1 Visit OT Treatments $Therapeutic Activity: 23-37 mins  Ragnar Waas H. OTR/L Supplemental OT, Department of rehab services [phone]   Mclain Freer R H. 01/07/2021, 1:25 PM

## 2021-01-07 NOTE — Progress Notes (Signed)
ANTICOAGULATION CONSULT NOTE - Follow Consult  Pharmacy Consult for: heparin infusion Indication: RLE DVT 8/5  No Known Allergies  Patient Measurements: Height: 5\' 8"  (172.7 cm) Weight: 106.2 kg (234 lb 2.1 oz) IBW/kg (Calculated) : 68.4 Heparin Dosing Weight: 92 kg  Vital Signs: Temp: 97.8 F (36.6 C) (08/31 0315) Temp Source: Oral (08/31 0315) BP: 125/85 (08/31 0530) Pulse Rate: 106 (08/31 0530)  Labs: Recent Labs    01/05/21 0306 01/06/21 0511 01/06/21 2030 01/06/21 2157 01/07/21 0352  HGB 8.2* 8.2*  --  7.9* 8.3*  HCT 24.4* 24.1*  --  23.9* 24.5*  PLT 305 274  --  287 332  HEPARINUNFRC  --  0.16* 0.30  --  0.43  CREATININE 3.85* 4.88*  --   --  4.08*     Estimated Creatinine Clearance: 21.6 mL/min (A) (by C-G formula based on SCr of 4.08 mg/dL (H)).   Medical History: Past Medical History:  Diagnosis Date   Acute ischemic left MCA stroke ([place]) 04/09/2014   Acute respiratory failure with hypoxia ([place]) 04/09/2014   Aspiration pneumonia ([place])    Asthma    as a child   Brain tumor ([place])    frontal   Cancer ([place])    Confusion    occasionally   Diabetes mellitus without complication ([place])    takes Metformin daily.   Dizziness    Dyspnea    pt states d/t weight   Epilepsy ([facility])    takes Keppra daily   GERD (gastroesophageal reflux disease)    takes Omeprazole daily   Headache    Hyperlipidemia    takes Atorvastatin daily   Hypertension    takes Lotrel daily   Peripheral edema    takes Lasix daily   Peripheral neuropathy    takes Gabapentin daily   Pneumonia 3 yrs ago   hx of   Seizures ([facility])    Sleep apnea    Urinary frequency     Medications:  Infusions:   sodium chloride Stopped (12/20/20 0955)   feeding supplement (NEPRO CARB STEADY) 1,000 mL (01/06/21 1845)   fentaNYL infusion INTRAVENOUS Stopped (01/06/21 1451)   heparin 1,450 Units/hr (01/07/21 0300)    HPI/CC:  65 yo M presenting with potentially sepsis/ chemotherapy reaction.     Assessment: Transitioned from CRRT to [facility]. Diagnosed with RLE DVT on 8/5. Started on apixaban 5mg  BID. Last dose 8/25 AM.  Heparin drip started in the meantime now s/p  trach placement on Mon 8/29 and trach revision. Heparin level was therapeutic at 0.43 on 1450 units/hr this morning. CBC was stable with no reported signs or symptoms of bleeding. The team would like to resume apixaban, since no further procedures are planned.   Goal of Therapy:  Heparin level 0.3-0.7 units/ml Monitor platelets by anticoagulation protocol: Yes   Plan:  -Discontinue heparin -Resume apixaban 5 mg twice daily  Thank you for allowing pharmacy to participate in this patient's care.  Reatha Harps, PharmD PGY1 Pharmacy Resident 01/07/2021 6:05 AM Check [email or web address] for unit specific pharmacy number

## 2021-01-07 NOTE — Progress Notes (Signed)
RT NOTE: patient placed on 35% ATC.  Currently tolerating well.  Will continue to monitor.  

## 2021-01-07 NOTE — Progress Notes (Signed)
01/07/2021 APP Georgann Housekeeper and I saw and evaluated the patient. Discussed with them and agree with their findings and plan as documented in the their note.  I have seen and evaluated the patient for recurrent acute hypoxic respiratory failure  S:  65yM with history of CRC on Tx, admitted fo rsepsis, colitis, recurrent respiratory failure course requiring trach placement c/b self-removal of trach, difficulty with bedside reinsertion in setting of acute hypoxic respiratory failure then requiring ENT OR trach 8/29.  O: Blood pressure 140/81, pulse 97, temperature 98.9 F (37.2 C), temperature source Axillary, resp. rate (!) 23, height 5\' 8"  (1.727 m), weight 106.2 kg, SpO2 100 %.   Exam: Gen:       No acute distress HEENT:  tracking, sclera anicteric Neck:      No masses, JVD Lungs:    Mechanical breath sounds, equal chest rise CV:         Regular rate and rhythm; no murmurs Abd:      + bowel sounds; soft, non-tender; no palpable masses, no distension Ext:    trace edema; adequate peripheral perfusion Skin:       Warm and dry; no rash Neuro:   follows commands   A:  Recurrent acute hypoxic respiratory failure s/p ENT OR trach 8/29 AKI on CKD s/p tunneled catheter 8/15 on HD Diarrhea CRC AGMA LE DVT Epilepsy  P:  - TCT today - PT/mobilize  Patient critically ill due to acute hypoxic respiratory failure Interventions to address this today TCT coordination Risk of deterioration without these interventions is high  I personally spent 36 minutes providing critical care not including any separately billable procedures   [place]

## 2021-01-08 ENCOUNTER — Inpatient Hospital Stay (HOSPITAL_COMMUNITY): Payer: Medicare HMO

## 2021-01-08 ENCOUNTER — Other Ambulatory Visit (HOSPITAL_COMMUNITY): Payer: Self-pay

## 2021-01-08 DIAGNOSIS — E1100 Type 2 diabetes mellitus with hyperosmolarity without nonketotic hyperglycemic-hyperosmolar coma (NKHHC): Secondary | ICD-10-CM | POA: Diagnosis not present

## 2021-01-08 LAB — GLUCOSE, CAPILLARY
Glucose-Capillary: 105 mg/dL — ABNORMAL HIGH (ref 70–99)
Glucose-Capillary: 127 mg/dL — ABNORMAL HIGH (ref 70–99)
Glucose-Capillary: 102 mg/dL — ABNORMAL HIGH (ref 70–99)
Glucose-Capillary: 133 mg/dL — ABNORMAL HIGH (ref 70–99)
Glucose-Capillary: 106 mg/dL — ABNORMAL HIGH (ref 70–99)
Glucose-Capillary: 165 mg/dL — ABNORMAL HIGH (ref 70–99)

## 2021-01-08 LAB — RENAL FUNCTION PANEL
Albumin: 1.9 g/dL — ABNORMAL LOW (ref 3.5–5.0)
Anion gap: 13 (ref 5–15)
BUN: 40 mg/dL — ABNORMAL HIGH (ref 8–23)
CO2: 21 mmol/L — ABNORMAL LOW (ref 22–32)
Calcium: 8.5 mg/dL — ABNORMAL LOW (ref 8.9–10.3)
Chloride: 101 mmol/L (ref 98–111)
Creatinine, Ser: 3.4 mg/dL — ABNORMAL HIGH (ref 0.61–1.24)
GFR, Estimated: 19 mL/min — ABNORMAL LOW (ref >60)
Glucose, Bld: 109 mg/dL — ABNORMAL HIGH (ref 70–99)
Phosphorus: 4.5 mg/dL (ref 2.5–4.6)
Potassium: 3.5 mmol/L (ref 3.5–5.1)
Sodium: 135 mmol/L (ref 135–145)

## 2021-01-08 MED ORDER — PANCRELIPASE (LIP-PROT-AMYL) 10440-39150 UNITS PO TABS
20880.0000 [IU] | ORAL_TABLET | Freq: Once | ORAL | Status: AC
  Administered 2021-01-08: 20880 [IU]
  Filled 2021-01-08: qty 2

## 2021-01-08 MED ORDER — SODIUM CHLORIDE 0.9 % IV SOLN: Freq: Once | INTRAVENOUS | Status: DC

## 2021-01-08 MED ORDER — SODIUM BICARBONATE 650 MG PO TABS
650.0000 mg | ORAL_TABLET | Freq: Once | ORAL | Status: AC
  Administered 2021-01-08: 650 mg
  Filled 2021-01-08: qty 1

## 2021-01-08 MED ORDER — HEPARIN SODIUM (PORCINE) 1000 UNIT/ML IJ SOLN
3200.0000 [IU] | INTRAMUSCULAR | Status: AC | PRN
  Administered 2021-01-08: 3200 [IU] via INTRAVENOUS
  Filled 2021-01-08: qty 4

## 2021-01-08 MED ORDER — DARBEPOETIN ALFA 60 MCG/0.3ML IJ SOSY
60.0000 ug | PREFILLED_SYRINGE | INTRAMUSCULAR | Status: DC
  Filled 2021-01-08: qty 0.3

## 2021-01-08 MED ORDER — APIXABAN 5 MG PO TABS
5.0000 mg | ORAL_TABLET | Freq: Two times a day (BID) | ORAL | Status: DC
  Administered 2021-01-08 – 2021-01-28 (×40): 5 mg
  Filled 2021-01-08 (×42): qty 1

## 2021-01-08 NOTE — Progress Notes (Addendum)
Patient ID: Thomas [name], male   DOB: [birth year], 65 y.o.   MRN: [id number] S: complaining of chest and back pain this morning. O:BP (!) 144/91   Pulse 98   Temp 98.5 F (36.9 C) (Oral)   Resp (!) 27   Ht _0  (1.727 m)   Wt 103.4 kg   SpO2 96%   BMI 34.66 kg/m   Intake/Output Summary (Last 24 hours) at 01/08/2021 1058 Last data filed at 01/08/2021 0600 Gross per 24 hour  Intake 1061.95 ml  Output 1250 ml  Net -188.05 ml   Intake/Output: I/O last 3 completed shifts: In: 1962.2 [I.V.:257.2; NG/GT:1705] Out: 6213 [Urine:600; Other:1500; YQMVH:8469]  Intake/Output this shift:  No intake/output data recorded. Weight change: -2.8 kg Gen:on vent via trach in mild distress GEX:BMWUX Resp: ventilated BS bilaterally Abd: +BS, soft, NT/ND, ostomy in place LKG:MWNUU presacral edema  Recent Labs  Lab 01/02/21 0604 01/03/21 0129 01/04/21 0016 01/05/21 0306 01/06/21 7253 01/06/21 1222 01/07/21 0352 01/07/21 0903 01/07/21 1943 01/08/21 0408  NA 131* 129*  131* 132* 133* 134*  --  135 136 138 135  K 4.0 3.9  3.5 3.3* 3.8 3.5  --  3.6 3.1* 3.2* 3.5  CL 95* 95*  99 93* 100 98  --  101 102 102 101  CO2 24 21*  20* 24 19* 17*  --  17* 23 22 21*  GLUCOSE 131* 108*  97 128* 87 148*  --  127* 123* 110* 109*  BUN 34* 48*  45* 23 43* 57*  --  53* 25* 33* 40*  CREATININE 3.57* 4.55*  3.97* 2.71* 3.85* 4.88*  --  4.08* 2.49* 2.95* 3.40*  ALBUMIN 1.9* 1.8*  1.6* 1.9* 1.9* 1.9*  --  1.8*  --   --  1.9*  CALCIUM 8.5* 8.7*  7.8* 8.6* 8.8* 9.7  --  8.5* 7.8* 8.2* 8.5*  PHOS 5.2* 6.0* 4.3 5.4*  5.6* 8.0* 8.4* 6.9*  --   --  4.5  AST  --  13*  --   --   --   --   --   --   --   --   ALT  --  11  --   --   --   --   --   --   --   --    Liver Function Tests: Recent Labs  Lab 01/03/21 0129 01/04/21 0016 01/06/21 0511 01/07/21 0352 01/08/21 0408  AST 13*  --   --   --   --   ALT 11  --   --   --   --   ALKPHOS 93  --   --   --   --   BILITOT 0.7  --   --   --   --   PROT 6.5  --    --   --   --   ALBUMIN 1.8*  1.6*   < > 1.9* 1.8* 1.9*   < > = values in this interval not displayed.   No results for input(s): LIPASE, AMYLASE in the last 168 hours. No results for input(s): AMMONIA in the last 168 hours. CBC: Recent Labs  Lab 01/03/21 0129 01/04/21 0016 01/05/21 0306 01/06/21 0511 01/06/21 2157 01/07/21 0352  WBC 14.9* 13.9* 14.3* 13.3* 12.0* 10.8*  NEUTROABS 10.5*  --   --   --   --   --   HGB 6.9* 8.4* 8.2* 8.2* 7.9* 8.3*  HCT 20.4* 24.9* 24.4* 24.1* 23.9*  24.5*  MCV 87.6 85.9 86.5 87.0 88.2 86.0  PLT 236 250 305 274 287 332   Cardiac Enzymes: No results for input(s): CKTOTAL, CKMB, CKMBINDEX, TROPONINI in the last 168 hours. CBG: Recent Labs  Lab 01/07/21 1559 01/07/21 1926 01/07/21 2324 01/08/21 0325 01/08/21 0803  GLUCAP 101* 100* 131* 105* 127*    Iron Studies:  Recent Labs    01/07/21 1055  IRON 33*  TIBC 239*   Studies/Results: DG CHEST PORT 1 VIEW  Result Date: 01/08/2021 CLINICAL DATA:  Chest pain EXAM: PORTABLE CHEST 1 VIEW COMPARISON:  Chest x-ray dated January 03, 2021 FINDINGS: Cardiac and mediastinal contours are unchanged and within normal limits. Interval placement of tracheostomy tube. Unchanged position of right chest wall port, left central venous line and enteric feeding tube. Click Decreased right basilar atelectasis. No new parenchymal opacities. No large pleural effusion or pneumothorax. IMPRESSION: Decreased left basilar atelectasis.  No new parenchymal opacities. Electronically Signed   By: Yetta Glassman M.D.   On: 01/08/2021 10:33    acetaminophen  1,000 mg Per Tube Q8H   apixaban  5 mg Per Tube BID   vitamin C  500 mg Per Tube TID   atorvastatin  20 mg Per Tube Daily   chlorhexidine gluconate (MEDLINE KIT)  15 mL Mouth Rinse BID   Chlorhexidine Gluconate Cloth  6 each Topical Daily   clonazepam  1 mg Per Tube BID   feeding supplement (PROSource TF)  45 mL Per Tube BID   fiber  1 packet Per Tube BID   gabapentin   100 mg Per Tube Daily   And   gabapentin  100 mg Per Tube Once per day on Tue Thu Sat   hydrocerin   Topical BID   insulin aspart  0-15 Units Subcutaneous Q4H   insulin detemir  15 Units Subcutaneous BID   levETIRAcetam  500 mg Per Tube BID   mouth rinse  15 mL Mouth Rinse 10 times per day   multivitamin  1 tablet Per Tube QHS   pantoprazole sodium  40 mg Per Tube BID   QUEtiapine  100 mg Per Tube BID   thiamine  100 mg Per Tube Daily    BMET    Component Value Date/Time   NA 135 01/08/2021 0408   K 3.5 01/08/2021 0408   CL 101 01/08/2021 0408   CO2 21 (L) 01/08/2021 0408   GLUCOSE 109 (H) 01/08/2021 0408   BUN 40 (H) 01/08/2021 0408   CREATININE 3.40 (H) 01/08/2021 0408   CREATININE 1.83 (H) 11/19/2020 0914   CALCIUM 8.5 (L) 01/08/2021 0408   GFRNONAA 19 (L) 01/08/2021 0408   GFRNONAA 41 (L) 11/19/2020 0914   GFRAA >60 04/01/2017 1921   CBC    Component Value Date/Time   WBC 10.8 (H) 01/07/2021 0352   RBC 2.85 (L) 01/07/2021 0352   HGB 8.3 (L) 01/07/2021 0352   HGB 11.2 (L) 11/19/2020 0914   HCT 24.5 (L) 01/07/2021 0352   PLT 332 01/07/2021 0352   PLT 212 11/19/2020 0914   MCV 86.0 01/07/2021 0352   MCH 29.1 01/07/2021 0352   MCHC 33.9 01/07/2021 0352   RDW 17.4 (H) 01/07/2021 0352   LYMPHSABS 1.8 01/03/2021 0129   MONOABS 1.7 (H) 01/03/2021 0129   EOSABS 0.5 01/03/2021 0129   BASOSABS 0.1 01/03/2021 0129      Assessment/Plan:   Dialysis-dependent AKI/CKD stage IIIb - presumably ischemic ATN in setting of severe sepsis and MSOF and remains oliguric.  CRRT initiated 7/31 and transitioned to IHD on 12/19/20.  S/p [facility] placement 12/22/20.  Continue with TTS schedule for now. Anasarca - improved with HD AMS - h/o CVA and seizure disorder. Acute hypoxic respiratory failure/VDRF - aspiration pneumonia.  s/p tracheostomy, however pt removed it on 01/01/21 s/p intubation.  New tracheostomy created 01/05/21 by ENT.  Right leg DVT - now on argatroban. Thrombocytopenia -  presumably due to critical illness.  Hematology following and HITT panel pending.  Heparin stopped 12/12/20 after starting argatroban for RLE DVT.  Platelets improved slightly to 94.   Metabolic acidosis - nongap.  Currently on IHD. Colon cancer stage IIIB - s/p partial colectomy, colostomy.  S/p 3 cycles of chemo.   Atrial fibrillation -on amiodarone and rate controlled. Anemia of critical illness- transfuse prn.  Will start aranesp with HD and low iron sats but IV iron on hold due to infection.  Acute metabolic encephalopathy - on fent gtt, zygpreza, and changing to precedex gtt. Hypophosphatemia - improved after IV repletion.  Continue to follow.  Disposition - poor overall prognosis.  He is not suitable for outpatient HD, doubt he will be candidate for LTC facility.  Palliative care consult (initially seen 12/08/20).  Unfortunately full code for now.  Donetta Potts, MD [employer] [phone]

## 2021-01-08 NOTE — Evaluation (Signed)
[facility] Speaking [facility] - Evaluation Patient Details  Name: Thomas [name] MRN: [id number] Date of Birth: Dec 25, 1955  Today's Date: 01/08/2021 Time: 0842-0906 SLP Time Calculation (min) (ACUTE ONLY): 24 min  Past Medical History:  Past Medical History:  Diagnosis Date   Acute ischemic left MCA stroke ([place]) 04/09/2014   Acute respiratory failure with hypoxia ([place]) 04/09/2014   Aspiration pneumonia ([place])    Asthma    as a child   Brain tumor ([place])    frontal   Cancer ([place])    Confusion    occasionally   Diabetes mellitus without complication ([place])    takes Metformin daily.   Dizziness    Dyspnea    pt states d/t weight   Epilepsy ([place])    takes Keppra daily   GERD (gastroesophageal reflux disease)    takes Omeprazole daily   Headache    Hyperlipidemia    takes Atorvastatin daily   Hypertension    takes Lotrel daily   Peripheral edema    takes Lasix daily   Peripheral neuropathy    takes Gabapentin daily   Pneumonia 3 yrs ago   hx of   Seizures ([place])    Sleep apnea    Urinary frequency    Past Surgical History:  Past Surgical History:  Procedure Laterality Date   BIOPSY  09/05/2020   Procedure: BIOPSY;  Surgeon: Irene Shipper, MD;  Location: [facility] ENDOSCOPY;  Service: Endoscopy;;   CARDIAC CATHETERIZATION  7 yrs ago   COLONOSCOPY WITH PROPOFOL N/A 09/05/2020   Procedure: COLONOSCOPY WITH PROPOFOL;  Surgeon: Irene Shipper, MD;  Location: [facility] ENDOSCOPY;  Service: Endoscopy;  Laterality: N/A;   CRANIOTOMY N/A 09/01/2016   Procedure: CRANIOTOMY TUMOR  LEFT PTERIONAL;  Surgeon: Ashok Pall, MD;  Location: [place];  Service: Neurosurgery;  Laterality: N/A;   cyst removed from chest      as a child   IR FLUORO GUIDE CV LINE LEFT  12/22/2020   IR US GUIDE VASC ACCESS LEFT  12/22/2020   NO PAST SURGERIES     PARTIAL COLECTOMY N/A 09/10/2020   Procedure: TRANSVERSE COLECTOMY;  Surgeon: [name], MD;  Location: [place];  Service: General;  Laterality: N/A;   POLYPECTOMY   09/05/2020   Procedure: POLYPECTOMY;  Surgeon: Irene Shipper, MD;  Location: [place];  Service: Endoscopy;;   PORTACATH PLACEMENT Right 10/01/2020   Procedure: INSERTION PORT-A-CATH;  Surgeon: [name], MD;  Location: [place];  Service: General;  Laterality: Right;   SUBMUCOSAL TATTOO INJECTION  09/05/2020   Procedure: SUBMUCOSAL TATTOO INJECTION;  Surgeon: Irene Shipper, MD;  Location: [place];  Service: Endoscopy;;   TRACHEOSTOMY TUBE PLACEMENT N/A 01/05/2021   Procedure: TRACHEOSTOMY;  Surgeon: [name], MD;  Location: [place];  Service: ENT;  Laterality: N/A;   HPI:  Pt is a 65 y/o male admitted on 7/25 for worsening sepsis vs reaction to chemotherapy with high ostomy output and soft BP. Pt developed worsening hypoxia and was requiring BIPAP w/ FIO2 of 100% and increased work of breathing. SLP consulted due to concern for aspiration. BSE 7/28 recommended NPO status and MBS, but this could not completed subsequently due to lethargy. CXR in AM of 7/30 showed some worsening concern of aspiration. ETT 7/30-8/8; tolerated nasal cannula for about an hour; reintubated 8/8-trach 8/11. Pt pulled out trach x2 8/22; replaced by PCCM. Bronch on 8/22 revealed endotracheal granulation tissue at trach site. Trach removed again on 8/25; unable to reinsert  despite multiple attenpts; pt reintubated 8/25- trach by ENT on 8/29. 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   Assessment / Plan / Recommendation Clinical Impression  Pt was seen for PMSV evaluation. He was alert and cooperative throughout the evaluation and he expressed to RN that he just wants to hear his voice. Pt was educated regarding the anatomy of the larynx, the impact of the trach  on voicing, and the goals of the session. Pt indicated understanding. Pt's RN reported that the pt has been struggling with anxiety and that his RR can increase to the 40s or low 50s when he is anxious. Cuff deflation resulted in frequent expectoration of minimal clear secretions and increased RR to 40s and up to 52 during coughing. Pt expressed that he believed he needed to be suctioned and tracheal suctioning was completed by RN. Baseline vitals were RR 28, SpO2 105, HR 105 with RR increasing to 40s and 50s during coughing and quickly recovering to <30 with prompts to relax and encouragement regarding his vitals. Pt tolerated finger occlusion without evidence of air trapping. He tolerated PMSV for 5 minutes with vitals ranging RR 25-27, SpO2 97-100, and HR 100-105. Pt was able to expectorate secretions orally following PMSV placement. He was able to produce words and phrases with significantly reduced vocal intensity and and a breathy vocal quality with minimal voicing; speech intelligibility was therefore reduced. PMSV was removed at the end of session and cuff was left deflated following agreement with RN. PMSV may be used with SLP only at this time. SLP will continue to follow pt. SLP Visit Diagnosis: Aphonia (R49.1)    SLP Assessment  Patient needs continued Speech Lanaguage Pathology Services    Follow Up Recommendations   (TBD)    Frequency and Duration min 2x/week  2 weeks    PMSV Trial PMSV was placed for: 5 Able to redirect subglottic air through upper airway: Yes Able to Attain Phonation: No Voice Quality: Aphonic (minimal voicing) Able to Expectorate Secretions: Yes Level of Secretion Expectoration with PMSV: Tracheal;Oral Breath Support for Phonation: Moderately decreased Intelligibility: Intelligibility reduced Word: 50-74% accurate Phrase: 0-24% accurate Sentence: 0-24% accurate Conversation: Not tested Respirations During Trial:  (25-27) SpO2 During Trial:  (97-99) Pulse  During Trial:  (100-105) Behavior: Alert;Cooperative;Anxious;Good eye contact;Responsive to questions   Tracheostomy Tube  Additional Tracheostomy Tube Assessment Fenestrated: No    Vent Dependency  FiO2 (%): 28 %    Cuff Deflation Trial    Tolerated Cuff Deflation: Yes Length of Time for Cuff Deflation Trial: 20 minutes Behavior: Alert;Cooperative;Anxious;Responsive to questions        Elim Economou I. Hardin Negus, [place], [place] Office number [phone] Pager [place] 01/08/2021, 9:54 AM

## 2021-01-08 NOTE — Progress Notes (Signed)
Attempted to get up with PT and patient HR up to 140's and patient became very anxious. While getting back to bed patient ostomy started a small leak, no extra supplies to change ostomy was at bedside, additional supplies ordered.  Unable to flush cor tack tube therefore unclogged feeding tube was ordered.

## 2021-01-08 NOTE — Progress Notes (Signed)
Physical Therapy Treatment Patient Details Name: Thomas [name] MRN: [id number] DOB: Jan 07, 1956 Today's Date: 01/08/2021    History of Present Illness 65 y.o. male present to ED 2/24 with high ostomy output. Patient admitted with ischemic ATN in setting of severe sepsis and multisystem organ failure. Hospital admission complicated by volume overload, aspiration pneumonia, acute respiratory failure, AKI and ischemic colitis. S/p trach placement 8/11. CorTrak 8/12. HD initiated 8/12. To IR 8/15 for tunneled HD cath. Pulled out trach x2 8/22 replaced by PCCM. Pulled out trach again in 8/25 but unable to be reinserted. Transferred back to ICU. 8/29 to OR for stoma clean up and trach replacement.  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    PT Comments    Pt progressing slowly toward goals.  Limited today by pain and anxiety.  Emphasis on warm up exercise, transition to EOB , sitting balance, sit to stand in the STEDY which was cut short by pt's anxiety and loss of focus.  With anxiety, pt's RR rose into the 30's and HR rose into the upper 130's with therapist and wife working to calm the patient.  Further treatment aborted when pt unable to de-escalate from the anxiety in the [facility].    Follow Up Recommendations  SNF     Equipment Recommendations  Other (comment) (TBA)    Recommendations for Other Services       Precautions / Restrictions Precautions Precautions: Fall Precaution Comments: cortrak, R subclavian tunneled HD cath, ostomy, O2 via trach collar    Mobility  Bed Mobility Overal bed mobility: Needs Assistance Bed Mobility: Supine to Sit;Sit to Supine Rolling: Mod assist;+2 for safety/equipment     Sit to supine: Max assist;+2 for physical assistance;+2 for  safety/equipment   General bed mobility comments: pt needed more assist to return to supine due to increased anxiety during the session.    Transfers Overall transfer level: Needs assistance   Transfers: Sit to/from Stand Sit to Stand: Max assist;+2 physical assistance         General transfer comment: pt became very anxious and appeared to have excalated pain levels during session while sitting in the STEDY  Ambulation/Gait                 Stairs             Wheelchair Mobility    Modified Rankin (Stroke Patients Only)       Balance Overall balance assessment: Needs assistance Sitting-balance support: Bilateral upper extremity supported;Single extremity supported;Feet supported Sitting balance-Leahy Scale: Poor Sitting balance - Comments: min to mod assist at EOB and sitting in the STEDY     Standing balance-Leahy Scale: Zero                              Cognition Arousal/Alertness: Awake/alert Behavior During Therapy: Restless Overall Cognitive Status: Difficult to assess                                 General Comments: pt follows commands with increased time and cues. Shakes head yes/no appropriately to questions.      Exercises Other Exercises Other Exercises: hip/knee flexion/extension ROM exercise with graded resistance in extension x 10 reps.  General Comments        Pertinent Vitals/Pain Pain Assessment: Faces Faces Pain Scale: Hurts even more Pain Location: stomach Pain Descriptors / Indicators: Grimacing;Guarding;Other (Comment) (patting) Pain Intervention(s): Monitored during session;Premedicated before session    Home Living                      Prior Function            PT Goals (current goals can now be found in the care plan section) Acute Rehab PT Goals Patient Stated Goal: to decrease pain PT Goal Formulation: With patient Time For Goal Achievement: 02/20/21 Potential to  Achieve Goals: Fair Progress towards PT goals: Progressing toward goals    Frequency    Min 2X/week      PT Plan Current plan remains appropriate    Co-evaluation              AM-PAC PT "6 Clicks" Mobility   Outcome Measure  Help needed turning from your back to your side while in a flat bed without using bedrails?: A Lot Help needed moving from lying on your back to sitting on the side of a flat bed without using bedrails?: Total Help needed moving to and from a bed to a chair (including a wheelchair)?: Total Help needed standing up from a chair using your arms (e.g., wheelchair or bedside chair)?: Total Help needed to walk in hospital room?: Total Help needed climbing 3-5 steps with a railing? : Total 6 Click Score: 7    End of Session Equipment Utilized During Treatment: Oxygen Activity Tolerance: Patient tolerated treatment well Patient left: in bed;with call bell/phone within reach;with family/visitor present Nurse Communication: Mobility status;Need for lift equipment PT Visit Diagnosis: Muscle weakness (generalized) (M62.81);Difficulty in walking, not elsewhere classified (R26.2);Other abnormalities of gait and mobility (R26.89)     Time: 3007-6226 PT Time Calculation (min) (ACUTE ONLY): 28 min  Charges:  $Therapeutic Activity: 23-37 mins                     01/08/2021  Thomas [name]., PT Acute Rehabilitation Services [phone]  (pager) [phone]  (office)   Thomas [name] 01/08/2021, 7:39 PM

## 2021-01-08 NOTE — Progress Notes (Addendum)
Nutrition Follow-up  DOCUMENTATION CODES:  Non-severe (moderate) malnutrition in context of chronic illness, Obesity unspecified  INTERVENTION:  Continue TF via Cortrak: -Nepro @ 24m/hr (13239mday) -454mrosource TF BID  Provides 2456 kcals, 129 grams protein, and 950m72mee water   -Continue renal mvi daily -Continue Nutrisource Fiber BID per tube  NUTRITION DIAGNOSIS:  Moderate Malnutrition related to chronic illness, cancer and cancer related treatments as evidenced by mild fat depletion, mild muscle depletion, percent weight loss. -- ongoing  GOAL:  Patient will meet greater than or equal to 90% of their needs -- met with TF  MONITOR:  Vent status, TF tolerance, Labs, Weight trends, Skin, I & O's  REASON FOR ASSESSMENT:  Consult Enteral/tube feeding initiation and management  ASSESSMENT:  65 y16. male who is a former with medical history of stage 3 colon cancer s/p partial colectomy and end colostomy on 5/4, recent chemo start with 3rd cycle on 7/22, sleep apnea, seizures, epilepsy, brain tumor, HTN, HLD, GERD, asthma, peripheral neuropathy, urinary frequency, DM, MCA stroke. He presented to the ED via EMS due to abdominal pain x2 weeks, anorexia, and weakness. Admitted for close monitoring for concern of mesenteric ischemia. Patient reported that abdominal pain was worse with eating PTA.  7/31 - CRRT initiated 8/9 - CRRT stopped  8/10 - tx to [facility] trach placement and iHD 8/11 - s/p trach 8/12 - cortrak placed (tip of tube in stomach) 8/15 - s/p Placement of a LEFT IJ 19 cm Palindrome tunneled HD catheter.  Catheter tip at caroatrial junction; transitioned to trach collar 8/22 - pulled trach out, replaced under bronch guidance, s/p bronch revealing mild area of granulation tissue of top of trach tube 8/25 - pulled trach out, transferred to ICU, emergently intubated 8/29 - trach replaced  Discussed pt with RN and during ICU rounds. Pt off vent, tolerating trach  collar, copious thin secretions noted. SLP to evaluate pt for PMV. Tolerating TF via Cortrak. Consider PEG placement if pt unable to have diet advanced.   Current TF: Nepro @ 55ml12m 45ml 34mource TF BID  Admission weight: 111.8 kg Current weight: 103.4 kg  UOP: 600ml x36mours Colostomy: 650ml x258murs I/O: -11.4L since admit  Last HD 8/31, net UF 1.5L.   Mild pitting edema noted to BUE and BLE per RN edema assessment.   Medications: vitamin C, nutrisource fiber BID, SSI, levemir, rena-vit, protonix, thiamine Labs: BUN 40 (H), Cr 3.40 (H) CBGs: 100-131 x24 hours  Diet Order:   Diet Order             Diet NPO time specified  Diet effective midnight                  EDUCATION NEEDS:  Not appropriate for education at this time  Skin:  Skin Assessment: Skin Integrity Issues: Skin Integrity Issues:: Stage II, DTI, Other (Comment) DTI: upper R and L face Stage II: coccyx, R back, L buttocks Other: sloughing to L hand due to hand-foot-and-mouth disease, R thigh non-pressure friction wound  Last BM:  8/31 650ml via16mostomy  Height:  Ht Readings from Last 1 Encounters:  01/01/21 _0  (1.727 m)   Weight:  Wt Readings from Last 1 Encounters:  01/08/21 103.4 kg   Ideal Body Weight:  70 kg  BMI:  Body mass index is 34.66 kg/m.  Estimated Nutritional Needs:  Kcal:  2300-2500 Protein:  120-140 gm Fluid:  1 L + UOP    Darnell Stimson AvLarkin Ina  LDN (she/her/hers) RD pager number and weekend/on-call pager number located in [place].

## 2021-01-08 NOTE — Anesthesia Postprocedure Evaluation (Signed)
Anesthesia Post Note  Patient: DEONDRAY OSPINA  Procedure(s) Performed: TRACHEOSTOMY (Neck)     Patient location during evaluation: ICU Anesthesia Type: General Level of consciousness: awake and alert (pt agitated/anxious) Pain management: pain level controlled Vital Signs Assessment: post-procedure vital signs reviewed and stable Respiratory status: patient connected to tracheostomy mask oxygen (trach working well, has weaned to trach collar) Cardiovascular status: blood pressure returned to baseline and stable Postop Assessment: no apparent nausea or vomiting Anesthetic complications: no   No notable events documented.  Last Vitals:  Vitals:   01/08/21 0733 01/08/21 0805  BP: (!) 144/87   Pulse: 99   Resp: (!) 26   Temp:  36.9 C  SpO2: 100%     Last Pain:  Vitals:   01/08/21 0805  TempSrc: Oral  PainSc:                  Kayra Crowell,E. Taleah Bellantoni

## 2021-01-08 NOTE — Progress Notes (Addendum)
NAME:  Thomas [name], MRN:  [id number], DOB:  23-Oct-1955, LOS: 2 ADMISSION DATE:  11/30/2020, CONSULTATION DATE:  7/30 REFERRING MD:  Thomas [name], CHIEF COMPLAINT:  Worsening hypoxia/dyspnea  History of Present Illness:  65 yo M admitted 7/24 for worsening sepsis vs reaction to chemotherapy with high ostomy output and soft BP on 7/25. Has had issues during his hospitalization with recurrent aspirations and more recently worsening respiratory status.  Most recently pt developed worsening hypoxia and was requiring BIPAP w/ FIO2 of 100% and increased work of breathing.   He was admitted to intensive care and subsequently had worsening clinical status requiring intubation, CRRT, multiple pressors and pneumonia along with CT findings 8/1 concerning for ischemic bowel.  Significant Hospital Events:  7/27 Transferred to [facility], stopped abx as sepsis resolved/ruled out 7/30 AM Worsened AME, more hypoxic with worsening renal funciton, new aspiration on abx restarted 7/30 PM Pt with increased o2 requirement on 100% FIO2 on BIPAP, d/w family who wanted to proceed with intubation, pt intubated 7/31 Tracheal aspirate grew moderate candida tropicalis  8/1 Some issues with tachycardia and hypotension overnight. Reproted high ostomy output.  GI PCR negative 8/2 down to 9 mcg of Levophed, however repeat CT abdomen/pelvis showed inflammatory wall thickening and fat stranding of the descending and sigmoid colon, new since prior, consistent with nonspecific infectious, inflammatory, or ischemic colitis.  Also new dense consolidative airspace opacity in the right lower and right middle lobes 8/3 further weaning NE. Weaning sedation. Full code, full scope per family meeting 8/2. Eraxis stopped (8/1-8/3) 8/4 off pressors, plt down to 103.  8/5 remains off pressors. Moving around more, not really following commands though  8/9 failed extubation 8/8, tolerated nasal cannula for about an hour, desaturations and inability to  clear secretions-reintubated 8/8 8/10 some agitation overnight 8/11 tracheostomy placement On trach collar since 8/16 8/22 pulled trach out, replaced under bronch guidance. Has proximal area of granulation tissue that is above the bottom of the trach tube.  8/25 Removed Trach, unable to reinsert, despite multiple attempts re-intubated 8/29 Anemia, 1 unit PRBC. ENT re-placed Trach  Interim History / Subjective:  Remains off vent and currently on trach collar, copious thin secretions, no further events overnight.   Objective   Blood pressure (!) 144/87, pulse 99, temperature 98.5 F (36.9 C), temperature source Oral, resp. rate (!) 26, height 5\' 8"  (1.727 m), weight 103.4 kg, SpO2 100 %.    Vent Mode: PSV;CPAP FiO2 (%):  [28 %-35 %] 28 % PEEP:  [5 cmH20] 5 cmH20 Pressure Support:  [5 cmH20] 5 cmH20   Intake/Output Summary (Last 24 hours) at 01/08/2021 0806 Last data filed at 01/08/2021 0600 Gross per 24 hour  Intake 1202.39 ml  Output 1250 ml  Net -47.61 ml   Filed Weights   01/06/21 0500 01/07/21 0315 01/08/21 0500  Weight: 104.9 kg 106.2 kg 103.4 kg     Exam:  General: obese, elderly male sitting in bed  HEENT: Trach in place Neuro: alert, anxious, follows commands  CV: RRR, no MRG PULM: coarse breath sounds, no use of accessory muscles  GI: soft, active bowels sounds, stoma >> pink, bag intact  Extremities: -edema  Skin: no rashes or lesions. R chest wall port-a-cath, left tunneled HD cath   Resolved Hospital Problem list   Shock  HHS Afib RVR  Lactic Acidosis  Thrombocytopenia Colitis  Assessment & Plan:   Acute Hypoxemic Respiratory Failure s/p Tracheostomy  Endotracheal Granulation Tissue  Aspiration PNA  OSA  Failed prior extubation due to secretions. Thomas [name] 8/11.  Pt removed trach 8/25, replaced at bedside but ultimately required intubation from above.  Pseudomonas in tracheal aspirate 8/15. Re-do trach 8/29 by ENT.  -Continue TC as tolerated  -Routine Trach  Care -ST ordered, evaluation for PMV  AKI on CKD IIIb Hyponatremia  Suspect ischemic ATN in setting of sepsis, MSOF. Required CRRT, initiated 7/31 with transition to IHD 8/12, tunnled cath placed 8/15.   -appreciate Nephrology assistance > deemed not suitable for outpatient HD -HD per Nephrology T/Th/S  -Trend BMP / urinary output -Replace electrolytes as indicated  Diarrhea Contributing to hyponatremia.  Improved.  -PRN imodium  -replacement IVF as needed  -fiber PT   Elevated anion gap: uremia? >> Resolved  -Trend BMP  Colorectal cancer Completed chemotherapy in July 2022. Followed by Thomas [name]. -Per ONC   LE DVT -Continue Eliquis  DM  -SSI, moderate scale -TF coverage 4 units Q4 >> D/C glucose 100s -levemir 15 units BID   Epilepsy -continue keppra   GOC: -seen by palliative care -Full code   Chronic pain: seems to have some neuropathic back/leg pain treated at home with gabapentin - PRN tylenol and oxycodone  - Scheduled clonazepam - Continue home gabapentin, appreciate pharmacy assistance for renal/HD dosing  Best Practice  DVT: full dose anticoagulation  SUP: PPI Nutrition: TF Family: Thomas [name] and Thomas [name] updated bedside 8/30 PT/OT/ST ordered   Critical Care Time: 47 min   Thomas [name], AGACNP-BC [facility]  PCCM Pgr: [phone]

## 2021-01-09 DIAGNOSIS — E1100 Type 2 diabetes mellitus with hyperosmolarity without nonketotic hyperglycemic-hyperosmolar coma (NKHHC): Secondary | ICD-10-CM | POA: Diagnosis not present

## 2021-01-09 LAB — CBC
HCT: 25.2 % — ABNORMAL LOW (ref 39.0–52.0)
Hemoglobin: 8.2 g/dL — ABNORMAL LOW (ref 13.0–17.0)
MCH: 28.5 pg (ref 26.0–34.0)
MCHC: 32.5 g/dL (ref 30.0–36.0)
MCV: 87.5 fL (ref 80.0–100.0)
Platelets: 342 10*3/uL (ref 150–400)
RBC: 2.88 MIL/uL — ABNORMAL LOW (ref 4.22–5.81)
RDW: 16.9 % — ABNORMAL HIGH (ref 11.5–15.5)
WBC: 12 10*3/uL — ABNORMAL HIGH (ref 4.0–10.5)
nRBC: 0 % (ref 0.0–0.2)

## 2021-01-09 LAB — RENAL FUNCTION PANEL
Albumin: 1.8 g/dL — ABNORMAL LOW (ref 3.5–5.0)
Anion gap: 11 (ref 5–15)
BUN: 24 mg/dL — ABNORMAL HIGH (ref 8–23)
CO2: 22 mmol/L (ref 22–32)
Calcium: 7.8 mg/dL — ABNORMAL LOW (ref 8.9–10.3)
Chloride: 103 mmol/L (ref 98–111)
Creatinine, Ser: 2.41 mg/dL — ABNORMAL HIGH (ref 0.61–1.24)
GFR, Estimated: 29 mL/min — ABNORMAL LOW (ref >60)
Glucose, Bld: 128 mg/dL — ABNORMAL HIGH (ref 70–99)
Phosphorus: 3.5 mg/dL (ref 2.5–4.6)
Potassium: 2.9 mmol/L — ABNORMAL LOW (ref 3.5–5.1)
Sodium: 136 mmol/L (ref 135–145)

## 2021-01-09 LAB — PHOSPHORUS: Phosphorus: 3.9 mg/dL (ref 2.5–4.6)

## 2021-01-09 LAB — GLUCOSE, CAPILLARY
Glucose-Capillary: 113 mg/dL — ABNORMAL HIGH (ref 70–99)
Glucose-Capillary: 114 mg/dL — ABNORMAL HIGH (ref 70–99)
Glucose-Capillary: 120 mg/dL — ABNORMAL HIGH (ref 70–99)
Glucose-Capillary: 135 mg/dL — ABNORMAL HIGH (ref 70–99)
Glucose-Capillary: 150 mg/dL — ABNORMAL HIGH (ref 70–99)
Glucose-Capillary: 190 mg/dL — ABNORMAL HIGH (ref 70–99)

## 2021-01-09 LAB — MAGNESIUM: Magnesium: 1.8 mg/dL (ref 1.7–2.4)

## 2021-01-09 MED ORDER — CLONAZEPAM 0.5 MG PO TBDP
0.5000 mg | ORAL_TABLET | Freq: Two times a day (BID) | ORAL | Status: DC
  Administered 2021-01-09 – 2021-01-28 (×37): 0.5 mg
  Filled 2021-01-09 (×40): qty 1

## 2021-01-09 MED ORDER — QUETIAPINE FUMARATE 50 MG PO TABS
50.0000 mg | ORAL_TABLET | Freq: Two times a day (BID) | ORAL | Status: DC
  Administered 2021-01-09 – 2021-01-28 (×40): 50 mg
  Filled 2021-01-09 (×41): qty 1

## 2021-01-09 MED ORDER — POTASSIUM CHLORIDE 20 MEQ PO PACK
40.0000 meq | PACK | Freq: Once | ORAL | Status: AC
  Administered 2021-01-09: 40 meq
  Filled 2021-01-09: qty 2

## 2021-01-09 MED ORDER — LABETALOL HCL 5 MG/ML IV SOLN
10.0000 mg | INTRAVENOUS | Status: AC | PRN
  Administered 2021-01-09: 10 mg via INTRAVENOUS
  Filled 2021-01-09: qty 4

## 2021-01-09 MED ORDER — SODIUM CHLORIDE 0.9 % IV SOLN
125.0000 mg | INTRAVENOUS | Status: DC
  Administered 2021-01-10 – 2021-01-15 (×3): 125 mg via INTRAVENOUS
  Filled 2021-01-09 (×4): qty 10

## 2021-01-09 MED ORDER — CLONAZEPAM 0.5 MG PO TABS
0.5000 mg | ORAL_TABLET | Freq: Two times a day (BID) | ORAL | Status: DC | PRN
  Administered 2021-01-10 – 2021-01-29 (×12): 0.5 mg
  Filled 2021-01-09 (×14): qty 1

## 2021-01-09 MED ORDER — AMLODIPINE BESYLATE 5 MG PO TABS
5.0000 mg | ORAL_TABLET | Freq: Every day | ORAL | Status: DC
  Administered 2021-01-09 – 2021-01-28 (×19): 5 mg
  Filled 2021-01-09 (×20): qty 1

## 2021-01-09 MED ORDER — JEVITY 1.5 CAL/FIBER PO LIQD
1000.0000 mL | ORAL | Status: DC
  Administered 2021-01-09 – 2021-01-21 (×10): 1000 mL
  Filled 2021-01-09 (×24): qty 1000

## 2021-01-09 MED ORDER — AMLODIPINE BESYLATE 5 MG PO TABS: 5.0000 mg | ORAL_TABLET | Freq: Every day | ORAL | Status: DC

## 2021-01-09 NOTE — Progress Notes (Signed)
Patient ID: Thomas [name], male   DOB: [birth year], 65 y.o.   MRN: [id number] S: no complaints, hd yesterday, net uf 1370cc. On trach collar O:BP 140/71   Pulse 94   Temp 97.9 F (36.6 C) (Oral)   Resp 12   Ht 5' 8"  (1.727 m)   Wt 104.4 kg   SpO2 98%   BMI 35.00 kg/m   Intake/Output Summary (Last 24 hours) at 01/09/2021 0815 Last data filed at 01/09/2021 0400 Gross per 24 hour  Intake 980 ml  Output 1770 ml  Net -790 ml   Intake/Output: I/O last 3 completed shifts: In: 1695 [NG/GT:1695] Out: 2220 [Other:1370; Stool:850]  Intake/Output this shift:  No intake/output data recorded. Weight change: 1.4 kg Gen:on vent via trach in mild distress MVE:HMCNO Resp: ventilated BS bilaterally Abd: +BS, soft, NT/ND, ostomy in place BSJ:GGEZM presacral edema  Recent Labs  Lab 01/03/21 0129 01/04/21 0016 01/05/21 0306 01/06/21 0511 01/06/21 1222 01/07/21 0352 01/07/21 0903 01/07/21 1943 01/08/21 0408 01/09/21 0350  NA 129*  131* 132* 133* 134*  --  135 136 138 135 136  K 3.9  3.5 3.3* 3.8 3.5  --  3.6 3.1* 3.2* 3.5 2.9*  CL 95*  99 93* 100 98  --  101 102 102 101 103  CO2 21*  20* 24 19* 17*  --  17* 23 22 21* 22  GLUCOSE 108*  97 128* 87 148*  --  127* 123* 110* 109* 128*  BUN 48*  45* 23 43* 57*  --  53* 25* 33* 40* 24*  CREATININE 4.55*  3.97* 2.71* 3.85* 4.88*  --  4.08* 2.49* 2.95* 3.40* 2.41*  ALBUMIN 1.8*  1.6* 1.9* 1.9* 1.9*  --  1.8*  --   --  1.9* 1.8*  CALCIUM 8.7*  7.8* 8.6* 8.8* 9.7  --  8.5* 7.8* 8.2* 8.5* 7.8*  PHOS 6.0* 4.3 5.4*  5.6* 8.0* 8.4* 6.9*  --   --  4.5 3.5  3.9  AST 13*  --   --   --   --   --   --   --   --   --   ALT 11  --   --   --   --   --   --   --   --   --    Liver Function Tests: Recent Labs  Lab 01/03/21 0129 01/04/21 0016 01/07/21 0352 01/08/21 0408 01/09/21 0350  AST 13*  --   --   --   --   ALT 11  --   --   --   --   ALKPHOS 93  --   --   --   --   BILITOT 0.7  --   --   --   --   PROT 6.5  --   --   --   --   ALBUMIN  1.8*  1.6*   < > 1.8* 1.9* 1.8*   < > = values in this interval not displayed.   No results for input(s): LIPASE, AMYLASE in the last 168 hours. No results for input(s): AMMONIA in the last 168 hours. CBC: Recent Labs  Lab 01/03/21 0129 01/04/21 0016 01/05/21 0306 01/06/21 0511 01/06/21 2157 01/07/21 0352 01/09/21 0350  WBC 14.9*   < > 14.3* 13.3* 12.0* 10.8* 12.0*  NEUTROABS 10.5*  --   --   --   --   --   --   HGB 6.9*   < >  8.2* 8.2* 7.9* 8.3* 8.2*  HCT 20.4*   < > 24.4* 24.1* 23.9* 24.5* 25.2*  MCV 87.6   < > 86.5 87.0 88.2 86.0 87.5  PLT 236   < > 305 274 287 332 342   < > = values in this interval not displayed.   Cardiac Enzymes: No results for input(s): CKTOTAL, CKMB, CKMBINDEX, TROPONINI in the last 168 hours. CBG: Recent Labs  Lab 01/08/21 1545 01/08/21 1918 01/08/21 2317 01/09/21 0337 01/09/21 0716  GLUCAP 133* 106* 165* 150* 135*    Iron Studies:  Recent Labs    01/07/21 1055  IRON 33*  TIBC 239*   Studies/Results: DG CHEST PORT 1 VIEW  Result Date: 01/08/2021 CLINICAL DATA:  Chest pain EXAM: PORTABLE CHEST 1 VIEW COMPARISON:  Chest x-ray dated January 03, 2021 FINDINGS: Cardiac and mediastinal contours are unchanged and within normal limits. Interval placement of tracheostomy tube. Unchanged position of right chest wall port, left central venous line and enteric feeding tube. Click Decreased right basilar atelectasis. No new parenchymal opacities. No large pleural effusion or pneumothorax. IMPRESSION: Decreased left basilar atelectasis.  No new parenchymal opacities. Electronically Signed   By: Yetta Glassman M.D.   On: 01/08/2021 10:33    acetaminophen  1,000 mg Per Tube Q8H   apixaban  5 mg Per Tube BID   vitamin C  500 mg Per Tube TID   atorvastatin  20 mg Per Tube Daily   chlorhexidine gluconate (MEDLINE KIT)  15 mL Mouth Rinse BID   Chlorhexidine Gluconate Cloth  6 each Topical Daily   clonazepam  0.5 mg Per Tube BID   feeding supplement  (PROSource TF)  45 mL Per Tube BID   fiber  1 packet Per Tube BID   gabapentin  100 mg Per Tube Daily   And   gabapentin  100 mg Per Tube Once per day on Tue Thu Sat   hydrocerin   Topical BID   insulin aspart  0-15 Units Subcutaneous Q4H   insulin detemir  15 Units Subcutaneous BID   levETIRAcetam  500 mg Per Tube BID   mouth rinse  15 mL Mouth Rinse 10 times per day   multivitamin  1 tablet Per Tube QHS   pantoprazole sodium  40 mg Per Tube BID   QUEtiapine  50 mg Per Tube BID   thiamine  100 mg Per Tube Daily    BMET    Component Value Date/Time   NA 136 01/09/2021 0350   K 2.9 (L) 01/09/2021 0350   CL 103 01/09/2021 0350   CO2 22 01/09/2021 0350   GLUCOSE 128 (H) 01/09/2021 0350   BUN 24 (H) 01/09/2021 0350   CREATININE 2.41 (H) 01/09/2021 0350   CREATININE 1.83 (H) 11/19/2020 0914   CALCIUM 7.8 (L) 01/09/2021 0350   GFRNONAA 29 (L) 01/09/2021 0350   GFRNONAA 41 (L) 11/19/2020 0914   GFRAA >60 04/01/2017 1921   CBC    Component Value Date/Time   WBC 12.0 (H) 01/09/2021 0350   RBC 2.88 (L) 01/09/2021 0350   HGB 8.2 (L) 01/09/2021 0350   HGB 11.2 (L) 11/19/2020 0914   HCT 25.2 (L) 01/09/2021 0350   PLT 342 01/09/2021 0350   PLT 212 11/19/2020 0914   MCV 87.5 01/09/2021 0350   MCH 28.5 01/09/2021 0350   MCHC 32.5 01/09/2021 0350   RDW 16.9 (H) 01/09/2021 0350   LYMPHSABS 1.8 01/03/2021 0129   MONOABS 1.7 (H) 01/03/2021 0129   EOSABS 0.5  01/03/2021 0129   BASOSABS 0.1 01/03/2021 0129      Assessment/Plan:   Dialysis-dependent AKI/CKD stage IIIb - presumably ischemic ATN in setting of severe sepsis and MSOF and remains oliguric.  CRRT initiated 7/31 and transitioned to IHD on 12/19/20.  S/p [facility] placement 12/22/20.  Continue with TTS schedule for now, HD tomorrow. Caution w/ k repletion, will try for 3K bath tomorrow Anasarca - improved with HD AMS - h/o CVA and seizure disorder. Acute hypoxic respiratory failure/VDRF - aspiration pneumonia.  s/p tracheostomy,  however pt removed it on 01/01/21 s/p intubation.  New tracheostomy created 01/05/21 by ENT.  Right leg DVT - now on argatroban. Thrombocytopenia - presumably due to critical illness.  Hematology following and HITT panel pending.  Heparin stopped 12/12/20 after starting argatroban for RLE DVT.  Platelets improved slightly to 94.   Metabolic acidosis - nongap.  Currently on IHD. Colon cancer stage IIIB - s/p partial colectomy, colostomy.  S/p 3 cycles of chemo.   Atrial fibrillation -on amiodarone and rate controlled. Anemia of critical illness- transfuse prn.  Will start aranesp with HD and low iron sats but IV iron on hold due to infection.  Acute metabolic encephalopathy - on fent gtt, zygpreza, and changing to precedex gtt. Hypophosphatemia - improved after IV repletion.  Continue to follow.  Disposition - poor overall prognosis.  He is not suitable for outpatient HD, doubt he will be candidate for LTC facility.  Palliative care consult (initially seen 12/08/20).  Full code  Gean Quint, MD [facility]

## 2021-01-09 NOTE — Progress Notes (Addendum)
Nutrition Follow-up  DOCUMENTATION CODES:   Non-severe (moderate) malnutrition in context of chronic illness, Obesity unspecified  INTERVENTION:   Continue tube feeds via Cortrak: - Change to Jevity 1.5 @ 65 ml/hr (1560 ml/day) - ProSource TF 45 ml BID  Tube feeding regimen provides 2420 kcal, 122 grams of protein, and 1186 ml of H2O.   - Continue renal MVI daily per tube  - Continue NutriSource Fiber BID per tube  - If pt unable to make progress toward diet advancement, recommend considering longer term enteral access (i.e. PEG)  NUTRITION DIAGNOSIS:   Moderate Malnutrition related to chronic illness, cancer and cancer related treatments as evidenced by mild fat depletion, mild muscle depletion, percent weight loss.  Ongoing, being addressed via TF  GOAL:   Patient will meet greater than or equal to 90% of their needs  Met via TF  MONITOR:   Diet advancement, Labs, Weight trends, TF tolerance, Skin, I & O's  REASON FOR ASSESSMENT:   Consult Enteral/tube feeding initiation and management  ASSESSMENT:   65 y.o. male who is a former with medical history of stage 3 colon cancer s/p partial colectomy and end colostomy on 5/4, recent chemo start with 3rd cycle on 7/22, sleep apnea, seizures, epilepsy, brain tumor, HTN, HLD, GERD, asthma, peripheral neuropathy, urinary frequency, DM, MCA stroke. He presented to the ED via EMS due to abdominal pain x2 weeks, anorexia, and weakness. Admitted for close monitoring for concern of mesenteric ischemia. Patient reported that abdominal pain was worse with eating PTA.  7/31 - CRRT initiated 8/09 - CRRT stopped  8/10 - transfer to [facility] for trach placement and iHD 8/11 - s/p trach 8/12 - Cortrak placed (tip of tube in stomach) 8/15 - s/p placement of [facility] 8/22 - pulled trach out, replaced under bronch guidance, s/p bronch revealing mild area of granulation tissue of top of trach tube 8/25 - pulled trach out, transferred to ICU,  emergently intubated 8/29 - trach replaced with ENT in OR  Discussed pt with RN and during ICU rounds. Pt remains on trach collar, tolerating well. Pt with copious thin secretions from around trach. Pt working with SLP. Pt remains on iHD with TTS schedule.  Lad HD was yesterday with 1370 ml net UF. Post-HD weight was 103 kg.  Pt with multiple recent episodes of hypokalemia. Phosphorus trending down and has been WNL since 9/01. Discussed adjusting TF formula with CCM MD who agreed. Will change to Jevity 1.5 in an attempt to bulk up/slow down colostomy output. Will closely monitor potassium and phosphorus labs and adjust regimen as appropriate.  Admission weight: 111.8 kg Current weight: 104.4 kg  Pt with non-pitting edema to BUE, +1 pitting edema to BLE, and +2 pitting edema to perineal region.  Current TF: Nepro @ 55 ml/hr, ProSource TF 45 ml BID  Medications reviewed and include: vitamin C 500 mg TID, nutrisource fiber BID, SSI q 4 hours, levemir 15 units BID, renavit, protonix, klor-con 40 mEq once, thiamine, ferric gluconate with HD  Labs reviewed: potassium 2.9, iron 33, hemoglobin 8.2 CBG's: 106-165 x 24 hours  Colostomy: 400 ml x 24 hours I/O's: -11.6 L since admit  Diet Order:   Diet Order             Diet NPO time specified  Diet effective midnight                   EDUCATION NEEDS:   No education needs have been identified at this time  Skin:  Skin Assessment: Skin Integrity Issues: DTI: upper R and L face Stage II: coccyx, R back, L buttocks Incisions: throat (trach) Other: sloughing to L hand due to hand-foot-and-mouth disease, R thigh non-pressure friction wound  Last BM:  01/09/21 colostomy  Height:   Ht Readings from Last 1 Encounters:  01/01/21 5' 8" (1.727 m)    Weight:   Wt Readings from Last 1 Encounters:  01/09/21 104.4 kg    Ideal Body Weight:  70 kg  BMI:  Body mass index is 35 kg/m.  Estimated Nutritional Needs:   Kcal:   2300-2500  Protein:  120-140 gm  Fluid:  1 L + UOP    Gustavus Bryant, MS, RD, LDN Inpatient Clinical Dietitian Please see AMiON for contact information.

## 2021-01-09 NOTE — Evaluation (Signed)
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   Dizziness    Dyspnea    pt states d/t weight   Epilepsy ([place])    takes Keppra daily   GERD (gastroesophageal reflux disease)    takes Omeprazole daily   Headache    Hyperlipidemia    takes Atorvastatin daily   Hypertension    takes Lotrel daily   Peripheral edema    takes Lasix daily   Peripheral neuropathy    takes Gabapentin daily   Pneumonia 3 yrs ago   hx of   Seizures ([place])    Sleep apnea    Urinary frequency    Past Surgical History:  Past Surgical History:  Procedure Laterality Date   BIOPSY  09/05/2020   Procedure: BIOPSY;  Surgeon: Irene Shipper, MD;  Location: [facility] ENDOSCOPY;  Service: Endoscopy;;   CARDIAC CATHETERIZATION  7 yrs ago   COLONOSCOPY WITH PROPOFOL N/A 09/05/2020   Procedure: COLONOSCOPY WITH PROPOFOL;  Surgeon: Irene Shipper, MD;  Location: [facility] ENDOSCOPY;  Service: Endoscopy;  Laterality: N/A;   CRANIOTOMY N/A 09/01/2016   Procedure: CRANIOTOMY TUMOR  LEFT PTERIONAL;  Surgeon: Ashok Pall, MD;  Location: [facility];  Service: Neurosurgery;  Laterality: N/A;   cyst removed from chest      as a child   IR FLUORO GUIDE CV LINE LEFT  12/22/2020   IR US GUIDE VASC ACCESS LEFT  12/22/2020   NO PAST SURGERIES     PARTIAL COLECTOMY N/A 09/10/2020   Procedure: TRANSVERSE COLECTOMY;  Surgeon: [name], MD;  Location: [place];   Service: General;  Laterality: N/A;   POLYPECTOMY  09/05/2020   Procedure: POLYPECTOMY;  Surgeon: Irene Shipper, MD;  Location: [place];  Service: Endoscopy;;   PORTACATH PLACEMENT Right 10/01/2020   Procedure: INSERTION PORT-A-CATH;  Surgeon: [name], MD;  Location: [place];  Service: General;  Laterality: Right;   SUBMUCOSAL TATTOO INJECTION  09/05/2020   Procedure: SUBMUCOSAL TATTOO INJECTION;  Surgeon: Irene Shipper, MD;  Location: [place];  Service: Endoscopy;;   TRACHEOSTOMY TUBE PLACEMENT N/A 01/05/2021   Procedure: TRACHEOSTOMY;  Surgeon: [name], MD;  Location: [place];  Service: ENT;  Laterality: N/A;   HPI:  Pt is a 65 y/o male admitted on 7/25 for worsening sepsis vs reaction to chemotherapy with high ostomy output and soft BP. Pt developed worsening hypoxia and was requiring BIPAP w/ FIO2 of 100% and increased work of breathing. SLP consulted due to concern for aspiration. BSE 7/28 recommended NPO status and MBS, but this could not completed subsequently due to lethargy. CXR in AM of 7/30 showed some worsening concern of aspiration. ETT 7/30-8/8; tolerated nasal cannula for about an hour; reintubated 8/8-trach 8/11. Pt pulled out trach x2 8/22; replaced by PCCM. Bronch on 8/22 revealed endotracheal granulation tissue at trach  site. Lurline Idol removed again on 8/25; unable to reinsert despite multiple attenpts; pt reintubated 8/25- trach by ENT on 8/29. BSE 8/22 limited to ice chips due to pt's refusal of other consistencies; rx NPO status with Cortrak. 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   Assessment / Plan / Recommendation Clinical Impression  Pt was seen for bedside swallow evaluation. Oral mechanism exam was  incomplete due to pt's difficulty following some commands; however, limited lingual ROM was noted despite pt's reported attempts. Pt demonstrated impaired posterior propulsion of ice chips and puree boluses despite reported attempted. Palpable lingual movement was minimal. Some repetitive superior-inferior hyolaryngeal movement was noted during pt's attempts to swallow but this movement is subjectively judged to be reduced and SLP suspects that optimal hyolaryngeal elevation was achieved. Boluses were ultimately suctioned from the pt's oral cavity. It is recommended that the pt's NPO status be maintained at this time with plan for instrumental assessment when pt is able to demonstrate at least some A-P transport of boluses and a volitional swallow.  SLP Visit Diagnosis: Dysphagia, unspecified (R13.10)    Aspiration Risk  Severe aspiration risk;Risk for inadequate nutrition/hydration    Diet Recommendation NPO   Medication Administration: Via alternative means    Other  Recommendations Oral Care Recommendations: Oral care QID;Staff/trained caregiver to provide oral care   Follow up Recommendations  (TBD)      Frequency and Duration min 2x/week  2 weeks       Prognosis Prognosis for Safe Diet Advancement: Fair Barriers to Reach Goals: Time post onset;Severity of deficits      Swallow Study   General Date of Onset: 12/04/20 HPI: Pt is a 65 y/o male admitted on 7/25 for worsening sepsis vs reaction to chemotherapy with high ostomy output and soft BP. Pt developed worsening hypoxia and was requiring BIPAP w/ FIO2 of 100% and increased work of breathing. SLP consulted due to concern for aspiration. BSE 7/28 recommended NPO status and MBS, but this could not completed subsequently due to lethargy. CXR in AM of 7/30 showed some worsening concern of aspiration. ETT 7/30-8/8; tolerated nasal cannula for about an hour; reintubated 8/8-trach 8/11. Pt pulled out trach x2 8/22; replaced by PCCM. Bronch  on 8/22 revealed endotracheal granulation tissue at trach site. Trach removed again on 8/25; unable to reinsert despite multiple attenpts; pt reintubated 8/25- trach by ENT on 8/29. BSE 8/22 limited to ice chips due to pt's refusal of other consistencies; rx NPO status with Cortrak. 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 Type of Study: Bedside Swallow Evaluation Previous Swallow Assessment: See HPI Diet Prior to this Study: Information not available Temperature Spikes Noted: No Respiratory Status: Trach Collar History of Recent Intubation: Yes Length of Intubations (days): 4 days Date extubated: 01/05/21 Behavior/Cognition: Alert;Requires cueing;Cooperative;Pleasant mood Oral Cavity Assessment:  (moderate secretions) Oral Care Completed by SLP: Yes Oral Cavity - Dentition: Missing dentition Self-Feeding Abilities: Total assist Patient Positioning: Upright in bed;Postural control adequate for testing Baseline Vocal Quality: Aphonic Volitional Cough: Congested Volitional Swallow: Able to elicit    Oral/Motor/Sensory Function Overall  Oral Motor/Sensory Function: Generalized oral weakness Lingual ROM: Reduced left;Reduced right Lingual Symmetry: Abnormal symmetry left;Abnormal symmetry right Lingual Strength: Reduced;Suspected CN XII (hypoglossal) dysfunction   Ice Chips Ice chips: Impaired Presentation: Spoon Oral Phase Functional Implications: Oral holding Pharyngeal Phase Impairments: Decreased hyoid-laryngeal movement   Thin Liquid Thin Liquid: Not tested    Nectar Thick Nectar Thick Liquid: Not tested   Honey Thick Honey Thick Liquid: Not tested   Puree Puree: Impaired Presentation: Spoon Oral Phase Impairments: Reduced  lingual movement/coordination   Solid     Solid: Not tested     Tadao Emig I. Hardin Negus, [place], [place] Office number [phone] Pager [place] 01/09/2021,5:19 PM

## 2021-01-09 NOTE — Progress Notes (Signed)
NAME:  Thomas [name], MRN:  [id number], DOB:  May 09, 1956, LOS: 56 ADMISSION DATE:  11/30/2020, CONSULTATION DATE:  7/30 REFERRING MD:  Riccardo Dubin, CHIEF COMPLAINT:  Worsening hypoxia/dyspnea  History of Present Illness:  65 yo M admitted 7/24 for worsening sepsis vs reaction to chemotherapy with high ostomy output and soft BP on 7/25. Has had issues during his hospitalization with recurrent aspirations and more recently worsening respiratory status.  Most recently pt developed worsening hypoxia and was requiring BIPAP w/ FIO2 of 100% and increased work of breathing.   He was admitted to intensive care and subsequently had worsening clinical status requiring intubation, CRRT, multiple pressors and pneumonia along with CT findings 8/1 concerning for ischemic bowel.  Significant Hospital Events:  7/27 Transferred to [facility], stopped abx as sepsis resolved/ruled out 7/30 AM Worsened AME, more hypoxic with worsening renal funciton, new aspiration on abx restarted 7/30 PM Pt with increased o2 requirement on 100% FIO2 on BIPAP, d/w family who wanted to proceed with intubation, pt intubated 7/31 Tracheal aspirate grew moderate candida tropicalis  8/1 Some issues with tachycardia and hypotension overnight. Reproted high ostomy output.  GI PCR negative 8/2 down to 9 mcg of Levophed, however repeat CT abdomen/pelvis showed inflammatory wall thickening and fat stranding of the descending and sigmoid colon, new since prior, consistent with nonspecific infectious, inflammatory, or ischemic colitis.  Also new dense consolidative airspace opacity in the right lower and right middle lobes 8/3 further weaning NE. Weaning sedation. Full code, full scope per family meeting 8/2. Eraxis stopped (8/1-8/3) 8/4 off pressors, plt down to 103.  8/5 remains off pressors. Moving around more, not really following commands though  8/9 failed extubation 8/8, tolerated nasal cannula for about an hour, desaturations and inability to  clear secretions-reintubated 8/8 8/10 some agitation overnight 8/11 tracheostomy placement On trach collar since 8/16 8/22 pulled trach out, replaced under bronch guidance. Has proximal area of granulation tissue that is above the bottom of the trach tube.  8/25 Removed Trach, unable to reinsert, despite multiple attempts re-intubated 8/29 Anemia, 1 unit PRBC. ENT re-placed Trach  Interim History / Subjective:  Tolerating trach collar Still has copious secretions requiring multiple suctioning  Objective   Blood pressure (!) 160/90, pulse 95, temperature 97.9 F (36.6 C), temperature source Oral, resp. rate (!) 21, height 5' 8"  (1.727 m), weight 104.4 kg, SpO2 100 %.    FiO2 (%):  [28 %] 28 %   Intake/Output Summary (Last 24 hours) at 01/09/2021 0737 Last data filed at 01/09/2021 0400 Gross per 24 hour  Intake 1035 ml  Output 1770 ml  Net -735 ml   Filed Weights   01/08/21 1230 01/08/21 1530 01/09/21 0413  Weight: 104.8 kg 103 kg 104.4 kg     Exam:  General: Middle-age, does not appear to be in acute distress HEENT: On ATM, trach in place Neuro: Alert, follows commands CV: S1-S2 appreciated PULM: Coarse breath sounds, some rhonchi, does not appear to have increased work of breathing GI: Bowel sounds appreciated, stoma >> pink, bag intact  Extremities: -edema  Skin: no rashes or lesions. R chest wall port-a-cath, left tunneled HD cath   Resolved Hospital Problem list   Shock  HHS Afib RVR  Lactic Acidosis  Thrombocytopenia Colitis  Assessment & Plan:  Acute hypoxemic respiratory failure s/p tracheostomy Endotracheal granulation tissue Aspiration pneumonia Obstructive sleep apnea Pseudomonas in tracheal aspirate 8/15 -Tracheostomy revision 829 by ENT -Continues to tolerate trach collar -Trach care  Acute kidney  injury on chronic kidney disease stage IIIb Hyponatremia -Suspect ischemic ATN in the setting of sepsis -CRRT initiated 7/31 -Intermittent hemodialysis  on 8/12 -Nephrology continues to assist with care -Hemodialysis Tuesday Thursday Saturday -Monitor electrolytes  Diarrhea -Appears to improve -Continue fibers  Colorectal cancer Completed chemotherapy in July 2022 -Followed by Dr. Benay Spice  Lower extremity DVT -Continue Eliquis  Diabetes -SSI with moderate scale -Levemir 15 units twice daily  Epilepsy -On Keppra  Goals of care -Remains a full code  History of chronic pain -As needed Tylenol and oxycodone -Scheduled clonazepam -On home gabapentin  Needs revision trach tract to mature prior to being transitioned to medical floor  Best Practice  DVT: full dose anticoagulation  SUP: PPI Nutrition: On tube feeds Family: Wife and daughter updated bedside 8/30 PT/OT/ST ordered   The patient is critically ill with multiple organ systems failure and requires high complexity decision making for assessment and support, frequent evaluation and titration of therapies, application of advanced monitoring technologies and extensive interpretation of multiple databases. Critical Care Time devoted to patient care services described in this note independent of APP/resident time (if applicable)  is 32 minutes.   Sherrilyn Rist MD [facility] Personal pager: See Amion If unanswered, please page CCM On-call: [phone]

## 2021-01-09 NOTE — Progress Notes (Addendum)
Physical Therapy Treatment Patient Details Name: Thomas [name] MRN: [id number] DOB: [birth year] Today's Date: 01/09/2021    History of Present Illness 65 y.o. male present to ED 2/24 with high ostomy output. Patient admitted with ischemic ATN in setting of severe sepsis and multisystem organ failure. Hospital admission complicated by volume overload, aspiration pneumonia, acute respiratory failure, AKI and ischemic colitis. S/p trach placement 8/11. CorTrak 8/12. HD initiated 8/12. To IR 8/15 for tunneled HD cath. Pulled out trach x2 8/22 replaced by PCCM. Pulled out trach again in 8/25 but unable to be reinserted. Transferred back to ICU. 8/29 to OR for stoma clean up and trach replacement.  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    PT Comments    Pt starting to progress better toward goals.  65 y.o. male present to ED 2/24 with high ostomy output. Patient admitted with ischemic ATN in setting of severe sepsis and multisystem organ failure. Hospital admission complicated by volume overload, aspiration pneumonia, acute respiratory failure, AKI and ischemic colitis. S/p trach placement 8/11. CorTrak 8/12. HD initiated 8/12. To IR 8/15 for tunneled HD cath. Pulled out trach x2 8/22 replaced by PCCM. Pulled out trach again in 8/25 but unable to be reinserted. Transferred back to ICU. 8/29 to OR for stoma clean up and trach replacement.  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    PT Comments    Pt starting to progress better toward goals.  Today was able to keep his anxiety under control.  Emphasis on transition to EOB, sitting balance, use of STEDY to work on sit to stands, standing balance/activity and use of the STEDY to transfer to the chair for work on sitting tolerance.    Follow Up Recommendations  SNF     Equipment Recommendations  Other (comment) (TBA with changes in status)    Recommendations for Other Services       Precautions / Restrictions Precautions Precautions: Fall Precaution Comments: cortrak, R subclavian tunneled HD cath, ostomy, O2 via trach collar    Mobility  Bed Mobility Overal bed mobility: Needs Assistance Bed Mobility: Supine to Sit     Supine to sit: Mod assist;+2 for physical assistance     General bed mobility comments: pt assisted walking LE's to EOB, needed truncal assist up via L elbow.  Pt then attempted to scoot to EOB via rocking and assymetrical scooting, but ultimately needed  min assist to complete fully.    Transfers Overall transfer level: Needs assistance Equipment used: 2 person hand held assist Transfers: Sit to/from Stand Sit to Stand: Mod assist;Max assist;+2 physical assistance         General transfer comment: used STEDY for transfer to the chair.  cued pt for sequencing, assisted pt forward and for boost.  Ambulation/Gait                 Stairs             Wheelchair Mobility    Modified Rankin (Stroke Patients Only)       Balance Overall balance assessment: Needs assistance Sitting-balance support: Feet supported;No upper extremity supported;Single extremity supported Sitting balance-Leahy Scale: Fair Sitting balance - Comments: pt worked on w/shifting and rocking to scoot, could move withing BOS without UE assist   Standing balance support: Bilateral upper extremity supported;During functional activity Standing balance-Leahy Scale: Poor Standing balance comment: reliant on external assist                            Cognition Arousal/Alertness: Awake/alert Behavior During Therapy: Restless Overall Cognitive Status: Difficult to assess                                 General Comments: pt follows commands with increased time and cues. Shakes head yes/no appropriately to questions.      Exercises Other Exercises Other Exercises: bil bicep/tricep presses with graded  resistance x 10 reps Other Exercises: bil hip/knee flex/ext with graded assist/resistance x10 reps    General Comments General comments (skin integrity, edema, etc.): vss on TC      Pertinent Vitals/Pain Pain Assessment: Faces Faces Pain Scale: Hurts a little bit Pain Location: stomach & back Pain Descriptors / Indicators: Moaning Pain Intervention(s): Monitored during session;Premedicated before session    Home Living                      Prior Function            PT Goals (current goals can now be found  in the care plan section) Acute Rehab PT Goals Patient Stated Goal: to decrease pain PT Goal Formulation: With patient Time For Goal Achievement: 02/20/21 Potential to Achieve Goals: Fair Progress towards PT goals: Progressing toward goals    Frequency    Min 2X/week      PT Plan Current plan remains appropriate    Co-evaluation PT/OT/SLP Co-Evaluation/Treatment: Yes Reason for Co-Treatment: For patient/therapist safety PT goals addressed during session: Mobility/safety with mobility OT goals addressed during session: Strengthening/ROM      AM-PAC PT  anxiety under control.  Emphasis on transition to EOB, sitting balance, use of STEDY to work on sit to stands, standing balance/activity and use of the STEDY to transfer to the chair for work on sitting tolerance.    Follow Up Recommendations  SNF     Equipment Recommendations  Other (comment) (TBA with changes in status)    Recommendations for Other Services       Precautions / Restrictions Precautions Precautions: Fall Precaution Comments: cortrak, R subclavian tunneled HD cath, ostomy, O2 via trach collar    Mobility  Bed Mobility Overal bed mobility: Needs Assistance Bed Mobility: Supine to Sit     Supine to sit: Mod assist;+2 for physical assistance     General bed mobility comments: pt assisted walking LE's to EOB, needed truncal assist up via L elbow.  Pt then attempted to scoot to EOB via rocking and assymetrical scooting, but ultimately needed  min assist to complete fully.    Transfers Overall transfer level: Needs assistance Equipment used: 2 person hand held assist Transfers: Sit to/from Stand Sit to Stand: Mod assist;Max assist;+2 physical assistance         General transfer comment: used STEDY for transfer to the chair.  cued pt for sequencing, assisted pt forward and for boost.  Ambulation/Gait                 Stairs             Wheelchair Mobility    Modified Rankin (Stroke Patients Only)       Balance Overall balance assessment: Needs assistance Sitting-balance support: Feet supported;No upper extremity supported;Single extremity supported Sitting balance-Leahy Scale: Fair Sitting balance - Comments: pt worked on w/shifting and rocking to scoot, could move withing BOS without UE assist   Standing balance support: Bilateral upper extremity supported;During functional activity Standing balance-Leahy Scale: Poor Standing balance comment: reliant on external assist                            Cognition Arousal/Alertness: Awake/alert Behavior During Therapy: Restless Overall Cognitive Status: Difficult to assess                                 General Comments: pt follows commands with increased time and cues. Shakes head yes/no appropriately to questions.      Exercises Other Exercises Other Exercises: bil bicep/tricep presses with graded  resistance x 10 reps Other Exercises: bil hip/knee flex/ext with graded assist/resistance x10 reps    General Comments General comments (skin integrity, edema, etc.): vss on TC      Pertinent Vitals/Pain Pain Assessment: Faces Faces Pain Scale: Hurts a little bit Pain Location: stomach & back Pain Descriptors / Indicators: Moaning Pain Intervention(s): Monitored during session;Premedicated before session    Home Living                      Prior Function            PT Goals (current goals can now be found  in the care plan section) Acute Rehab PT Goals Patient Stated Goal: to decrease pain PT Goal Formulation: With patient Time For Goal Achievement: 02/20/21 Potential to Achieve Goals: Fair Progress towards PT goals: Progressing toward goals    Frequency    Min 2X/week      PT Plan Current plan remains appropriate    Co-evaluation PT/OT/SLP Co-Evaluation/Treatment: Yes Reason for Co-Treatment: For patient/therapist safety PT goals addressed during session: Mobility/safety with mobility OT goals addressed during session: Strengthening/ROM      AM-PAC PT "6 Clicks" Mobility   Outcome Measure  Help needed turning from your back to your side while in a flat bed without using bedrails?: A Lot Help needed moving from lying on your back to sitting on the side of a flat bed without using bedrails?: Total Help needed moving to and from a bed to a chair (including a wheelchair)?: Total Help needed standing up from a chair using your arms (e.g., wheelchair or bedside chair)?: Total Help needed to walk in hospital room?: Total Help needed climbing 3-5 steps with a railing? : Total 6 Click Score: 7    End of Session Equipment Utilized During Treatment: Oxygen Activity Tolerance: Patient tolerated treatment well Patient left: in chair;with call bell/phone within reach Nurse Communication: Mobility status;Need for lift equipment (or STEDY) PT Visit Diagnosis: Muscle weakness (generalized) (M62.81);Other abnormalities of gait and mobility (R26.89)     Time: 1950-9326 PT Time Calculation (min) (ACUTE ONLY): 25 min  Charges:  $Therapeutic Activity: 8-22 mins                     01/09/2021  Ginger Carne., PT Acute Rehabilitation Services [phone]  (pager) [phone]  (office)   Tessie Fass Yeila Morro 01/09/2021, 4:23 PM

## 2021-01-09 NOTE — Progress Notes (Signed)
  Speech Language Pathology Treatment:    Patient Details Name: Thomas [name] MRN: [id number] DOB: [birth year] Today's Date: 01/09/2021 Time: 8921-1941 SLP Time Calculation (min) (ACUTE ONLY): 17 min  Assessment / Plan / Recommendation Clinical Impression  Pt was seen for PMSV treatment. Pt was alert and coopertive throughout the session and he reported the pt's anxiety is notably improved today. Cuff was deflated upon SLP's arrival and he tolerated PMSV for 23 minutes total including the time of the swallow evaluation. Vitals range was RR 24-29, SpO2 98-100, and HR 102 during this period and pt denied any respiratory difficulty. Pt was able to expectorate secretions orally and tracheally. Pt was able to access upper airway; he demonstrated blowing and no evidence of air trapping was noted upon PMV removal. He continues to be aphonic despite verbal prompts for improved breath support. However, he very was motivated and continued to attempt tasks despite difficulty. SLP questions vocal fold injury secondary to multiple intubations. It is recommended that PMSV be used intermittently with staff once full supervision is provided, and SLP will continue to follow pt.    HPI HPI: Pt is a 65 y/o male admitted on 7/25 for worsening sepsis vs reaction to chemotherapy with high ostomy output and soft BP. Pt developed worsening hypoxia and was requiring BIPAP w/ FIO2 of 100% and increased work of breathing. SLP consulted due to concern for aspiration. BSE 7/28 recommended NPO status and MBS, but this could not completed subsequently due to lethargy. CXR in AM of 7/30 showed some worsening concern of aspiration. ETT 7/30-8/8; tolerated nasal cannula for about an hour; reintubated 8/8-trach 8/11. Pt pulled out trach x2 8/22; replaced by PCCM. Bronch on 8/22 revealed endotracheal granulation tissue at trach site. Trach removed again on 8/25; unable to reinsert despite multiple attenpts; pt reintubated 8/25- trach by ENT on  8/29. BSE 8/22 limited to ice chips due to pt's refusal of other consistencies; rx NPO status with Cortrak. 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      SLP Plan  Continue with current plan of care       Recommendations  Diet recommendations: NPO Medication Administration: Via alternative means      Patient may use Passy-Muir Speech Valve: Intermittently with supervision PMSV Supervision: Full         Oral Care Recommendations: Oral care QID;Staff/trained caregiver to provide oral care Follow up Recommendations:  (TBD) SLP Visit Diagnosis: Aphonia (R49.1) Plan: Continue with current plan of care       Leisel Pinette I. Hardin Negus, [place], [place] Office number [phone] Pager [place] 01/09/2021, 4:51 PM

## 2021-01-09 NOTE — Progress Notes (Signed)
Occupational Therapy Treatment Patient Details Name: Thomas [name] MRN: [id number] DOB: 1956/04/25 Today's Date: 01/09/2021    History of present illness 65 y.o. male present to ED 2/24 with high ostomy output. Patient admitted with ischemic ATN in setting of severe sepsis and multisystem organ failure. Hospital admission complicated by volume overload, aspiration pneumonia, acute respiratory failure, AKI and ischemic colitis. S/p trach placement 8/11. CorTrak 8/12. HD initiated 8/12. To IR 8/15 for tunneled HD cath. Pulled out trach x2 8/22 replaced by PCCM. Pulled out trach again in 8/25 but unable to be reinserted. Transferred back to ICU. 8/29 to OR for stoma clean up and trach replacement.  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   OT comments  This 65 yo male admitted with above seen in conjunction with PT to see if we could progress mobility. Pt di better than yesterday (with less c/o pain and less anxiety), still fatigues quickly with minimal activity. He stood x2 with sara stedy with Mod A (each ~20 seconds), used LUE to wash his face once seated in recliner, and needed less A for supine to sit. He will continue to benefit from acute OT with followup at SNF.  Follow Up Recommendations  SNF;Supervision/Assistance - 24 hour    Equipment Recommendations  Other (comment) (TBD next venue)       Precautions / Restrictions Precautions Precautions: Fall Precaution Comments: cortrak, R subclavian tunneled HD cath, ostomy, O2 via trach collar       Mobility Bed Mobility Overal bed mobility: Needs Assistance Bed Mobility: Supine to sit     Supine to sit: Mod assist;+2 for physical assistance        Transfers Overall transfer level: Needs assistance Equipment used: 2  person hand held assist Transfers: Sit to/from Stand Sit to Stand: Mod assist;+2 safety/equipment            Balance Overall balance assessment: Needs assistance Sitting-balance support: Feet supported;Bilateral upper extremity supported Sitting balance-Leahy Scale: Fair Sitting balance - Comments: pt worked on w/shifting and rocking to scoot, could move withing BOS without UE assist   Standing balance support: Bilateral upper extremity supported;During functional activity Standing balance-Leahy Scale: Poor Standing balance comment: reliant on external assist, right lateral lean                           ADL either performed or assessed with clinical judgement   ADL Overall ADL's : Needs assistance/impaired     Grooming: Wash/dry hands;Set up;Supervision/safety;Sitting Grooming Details (indicate cue type and reason): in recliner                   Toilet Transfer Details (indicate cue type and reason): Mod A sit<>stand with use of Denna Haggard with righr lateral lean, +2 safety with turning the sara stedy and managing the lines/tiubes.                 Vision   Vision Assessment?: No apparent visual deficits          Cognition Arousal/Alertness: Awake/alert Behavior During Therapy: WFL for tasks assessed/performed Overall Cognitive Status: Difficult to assess  General Comments: pt follows commands with increased time and cues. Shakes head yes/no appropriately to questions.        Exercises Exercises: Other exercises Other Exercises Other Exercises: bil bicep/tricep presses with graded resistance x 10 reps Other Exercises: bil hip/knee flex/ext with graded assist/resistance x10 reps      General Comments vss on TC    Pertinent Vitals/ Pain       Pain Assessment: Faces Faces Pain Scale: Hurts a little bit Pain Location: stomach & back Pain Descriptors / Indicators: Moaning Pain Intervention(s):  Monitored during session                                                          Frequency  Min 2X/week        Progress Toward Goals  OT Goals(current goals can now be found in the care plan section)  Progress towards OT goals: Progressing toward goals  Acute Rehab OT Goals Patient Stated Goal: to decrease pain OT Goal Formulation: With patient Time For Goal Achievement: 01/23/21 Potential to Achieve Goals: Good  Plan Discharge plan remains appropriate    Co-evaluation    PT/OT/SLP Co-Evaluation/Treatment: Yes Reason for Co-Treatment: For patient/therapist safety PT goals addressed during session: Mobility/safety with mobility;Balance OT goals addressed during session: Strengthening/ROM;ADL's and self-care      AM-PAC OT "6 Clicks" Daily Activity     Outcome Measure   Help from another person eating meals?: Total Help from another person taking care of personal grooming?: A Lot Help from another person toileting, which includes using toliet, bedpan, or urinal?: A Lot Help from another person bathing (including washing, rinsing, drying)?: A Lot Help from another person to put on and taking off regular upper body clothing?: Total Help from another person to put on and taking off regular lower body clothing?: Total 6 Click Score: 9    End of Session Equipment Utilized During Treatment: Oxygen (trach collar)  OT Visit Diagnosis: Unsteadiness on feet (R26.81);Muscle weakness (generalized) (M62.81);Pain Pain - Right/Left:  (stomach)   Activity Tolerance Patient tolerated treatment well   Patient Left in chair;with call bell/phone within reach;with chair alarm set   Nurse Communication Mobility status;Need for lift equipment        Time: 8338-2505 OT Time Calculation (min): 35 min  Charges: OT General Charges $OT Visit: 1 Visit OT Treatments $Self Care/Home Management : 8-22 mins  [place] Circle, OTR/L Acute [employer] Pager  [phone] Office [phone]     Almon Register 01/09/2021, 5:55 PM

## 2021-01-09 NOTE — Progress Notes (Signed)
Progress Note  Pt managed to sit up in the recliner for 1.5 hours and though tired, enjoyed his time up.    01/09/21 1624  PT Visit Information  Last PT Received On 01/09/21  Assistance Needed +2  History of Present Illness 65 y.o. male present to ED 2/24 with high ostomy output. Patient admitted with ischemic ATN in setting of severe sepsis and multisystem organ failure. Hospital admission complicated by volume overload, aspiration pneumonia, acute respiratory failure, AKI and ischemic colitis. S/p trach placement 8/11. CorTrak 8/12. HD initiated 8/12. To IR 8/15 for tunneled HD cath. Pulled out trach x2 8/22 replaced by PCCM. Pulled out trach again in 8/25 but unable to be reinserted. Transferred back to ICU. 8/29 to OR for stoma clean up and trach replacement.  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  Subjective Data  Patient Stated Goal to decrease pain  Precautions  Precautions Fall  Precaution Comments cortrak, R subclavian tunneled HD cath, ostomy, O2 via trach collar  Pain Assessment  Pain Assessment Faces  Faces Pain Scale 2  Pain Location stomach & back  Pain Intervention(s) Monitored during session  Cognition  Arousal/Alertness Awake/alert  Behavior During Therapy Restless;WFL for tasks assessed/performed  Overall Cognitive Status Difficult to assess  General Comments pt follows commands with increased time and cues. Shakes head yes/no appropriately to questions.  Difficult to assess due to Tracheostomy;Impaired communication  Bed Mobility  Overal bed mobility Needs Assistance  Bed Mobility Sit to Supine  Sit to supine Max assist;+2 for physical assistance  General bed mobility comments cued for technique down toward sidelying, but pt instantly rolled onto his  back and needed significant assist for LE's into bed.  Transfers  Overall transfer level Needs assistance  Transfers Sit to/from Stand;Stand Pivot Transfers  Sit to Stand Mod assist;+2 physical assistance;Max assist (x2)  Stand pivot transfers Max assist;+2 physical assistance  General transfer comment cued for hand placement: asisted forward and with boost.  stability asisst during peri care. transfer from [facility] to bed with significant w/shift and unweighting assist with multiple small pivot steps to turn and back up.  Balance  Sitting balance-Leahy Scale Fair  Standing balance-Leahy Scale Poor  PT - End of Session  Equipment Utilized During Treatment Oxygen  Activity Tolerance Patient tolerated treatment well  Patient left in bed;with call bell/phone within reach;with bed alarm set;with nursing/sitter in room  Nurse Communication Mobility status (lift vs stedy)   PT - Assessment/Plan  PT Plan Current plan remains appropriate  PT Visit Diagnosis Muscle weakness (generalized) (M62.81);Other abnormalities of gait and mobility (R26.89)  PT Frequency (ACUTE ONLY) Min 2X/week  Follow Up Recommendations SNF  PT equipment Other (comment) (TBA)  AM-PAC PT "6 Clicks" Mobility Outcome Measure (Version 2)  Help needed turning from your back to your side while in a flat bed without using bedrails? 2  Help needed moving from lying on your back to sitting on the side of a flat bed without using bedrails? 1  Help needed moving to and from a bed to a chair (including a wheelchair)? 1  Help needed standing up from a chair using your arms (e.g., wheelchair or bedside chair)? 1  Help needed to walk in hospital room? 1  Help needed climbing 3-5 steps with a railing?  1  6 Click Score 7  Consider Recommendation of Discharge To: CIR/SNF/LTACH  Progressive Mobility  What is the highest level of mobility based on the progressive mobility assessment? Level 3 (Stands with assist) - Balance while standing  and  cannot march in place  Mobility Out of bed to chair with meals  PT Goal Progression  Progress towards PT goals Progressing toward goals  Acute Rehab PT Goals  PT Goal Formulation With patient  Time For Goal Achievement 02/20/21  Potential to Achieve Goals Fair  PT Time Calculation  PT Start Time (ACUTE ONLY) 1455  PT Stop Time (ACUTE ONLY) 1509  PT Time Calculation (min) (ACUTE ONLY) 14 min  PT General Charges  $$ ACUTE PT VISIT 1 Visit  PT Treatments  $Therapeutic Activity 8-22 mins   01/09/2021  Ginger Carne., PT Acute Rehabilitation Services [phone]  (pager) [phone]  (office)

## 2021-01-10 DIAGNOSIS — E1100 Type 2 diabetes mellitus with hyperosmolarity without nonketotic hyperglycemic-hyperosmolar coma (NKHHC): Secondary | ICD-10-CM | POA: Diagnosis not present

## 2021-01-10 LAB — RENAL FUNCTION PANEL
Albumin: 2.2 g/dL — ABNORMAL LOW (ref 3.5–5.0)
Anion gap: 13 (ref 5–15)
BUN: 47 mg/dL — ABNORMAL HIGH (ref 8–23)
CO2: 23 mmol/L (ref 22–32)
Calcium: 9.3 mg/dL (ref 8.9–10.3)
Chloride: 100 mmol/L (ref 98–111)
Creatinine, Ser: 3.73 mg/dL — ABNORMAL HIGH (ref 0.61–1.24)
GFR, Estimated: 17 mL/min — ABNORMAL LOW (ref >60)
Glucose, Bld: 161 mg/dL — ABNORMAL HIGH (ref 70–99)
Phosphorus: 5.3 mg/dL — ABNORMAL HIGH (ref 2.5–4.6)
Potassium: 3.9 mmol/L (ref 3.5–5.1)
Sodium: 136 mmol/L (ref 135–145)

## 2021-01-10 LAB — CBC WITH DIFFERENTIAL/PLATELET
Abs Immature Granulocytes: 0.2 10*3/uL — ABNORMAL HIGH (ref 0.00–0.07)
Basophils Absolute: 0.1 10*3/uL (ref 0.0–0.1)
Basophils Relative: 1 %
Eosinophils Absolute: 0.4 10*3/uL (ref 0.0–0.5)
Eosinophils Relative: 3 %
HCT: 25.7 % — ABNORMAL LOW (ref 39.0–52.0)
Hemoglobin: 8.2 g/dL — ABNORMAL LOW (ref 13.0–17.0)
Immature Granulocytes: 2 %
Lymphocytes Relative: 14 %
Lymphs Abs: 1.8 10*3/uL (ref 0.7–4.0)
MCH: 28 pg (ref 26.0–34.0)
MCHC: 31.9 g/dL (ref 30.0–36.0)
MCV: 87.7 fL (ref 80.0–100.0)
Monocytes Absolute: 2 10*3/uL — ABNORMAL HIGH (ref 0.1–1.0)
Monocytes Relative: 16 %
Neutro Abs: 8 10*3/uL — ABNORMAL HIGH (ref 1.7–7.7)
Neutrophils Relative %: 64 %
Platelets: 381 10*3/uL (ref 150–400)
RBC: 2.93 MIL/uL — ABNORMAL LOW (ref 4.22–5.81)
RDW: 16.9 % — ABNORMAL HIGH (ref 11.5–15.5)
WBC: 12.4 10*3/uL — ABNORMAL HIGH (ref 4.0–10.5)
nRBC: 0 % (ref 0.0–0.2)

## 2021-01-10 LAB — GLUCOSE, CAPILLARY
Glucose-Capillary: 130 mg/dL — ABNORMAL HIGH (ref 70–99)
Glucose-Capillary: 134 mg/dL — ABNORMAL HIGH (ref 70–99)
Glucose-Capillary: 151 mg/dL — ABNORMAL HIGH (ref 70–99)
Glucose-Capillary: 159 mg/dL — ABNORMAL HIGH (ref 70–99)
Glucose-Capillary: 167 mg/dL — ABNORMAL HIGH (ref 70–99)
Glucose-Capillary: 178 mg/dL — ABNORMAL HIGH (ref 70–99)

## 2021-01-10 LAB — MAGNESIUM: Magnesium: 1.9 mg/dL (ref 1.7–2.4)

## 2021-01-10 MED ORDER — ANTICOAGULANT SODIUM CITRATE 4% (200MG/5ML) IV SOLN
5.0000 mL | Status: DC
  Administered 2021-01-10: 5 mL
  Administered 2021-01-20 – 2021-01-24 (×2): 3.2 mL
  Administered 2021-01-29 – 2021-03-03 (×11): 5 mL
  Filled 2021-01-10 (×4): qty 5
  Filled 2021-01-10: qty 3.2
  Filled 2021-01-10 (×31): qty 5

## 2021-01-10 MED ORDER — OXYCODONE HCL 5 MG PO TABS
5.0000 mg | ORAL_TABLET | Freq: Once | ORAL | Status: AC
Start: 2021-01-10 — End: 2021-01-10

## 2021-01-10 MED ORDER — GABAPENTIN 250 MG/5ML PO SOLN
100.0000 mg | Freq: Three times a day (TID) | ORAL | Status: DC
  Administered 2021-01-10 – 2021-01-29 (×51): 100 mg
  Filled 2021-01-10 (×67): qty 2

## 2021-01-10 MED ORDER — ANTICOAGULANT SODIUM CITRATE 4% (200MG/5ML) IV SOLN: 5.0000 mL | Status: DC

## 2021-01-10 NOTE — Progress Notes (Signed)
NAME:  Thomas [name], MRN:  [id number], DOB:  [birth year], LOS: 73 ADMISSION DATE:  11/30/2020, CONSULTATION DATE:  7/30 REFERRING MD:  Riccardo Dubin, CHIEF COMPLAINT:  Worsening hypoxia/dyspnea  History of Present Illness:  65 yo M admitted 7/24 for worsening sepsis vs reaction to chemotherapy with high ostomy output and soft BP on 7/25. Has had issues during his hospitalization with recurrent aspirations and more recently worsening respiratory status.  Most recently pt developed worsening hypoxia and was requiring BIPAP w/ FIO2 of 100% and increased work of breathing.   He was admitted to intensive care and subsequently had worsening clinical status requiring intubation, CRRT, multiple pressors and pneumonia along with CT findings 8/1 concerning for ischemic bowel.  Significant Hospital Events:  7/27 Transferred to [facility], stopped abx as sepsis resolved/ruled out 7/30 AM Worsened AME, more hypoxic with worsening renal funciton, new aspiration on abx restarted 7/30 PM Pt with increased o2 requirement on 100% FIO2 on BIPAP, d/w family who wanted to proceed with intubation, pt intubated 7/31 Tracheal aspirate grew moderate candida tropicalis  8/1 Some issues with tachycardia and hypotension overnight. Reproted high ostomy output.  GI PCR negative 8/2 down to 9 mcg of Levophed, however repeat CT abdomen/pelvis showed inflammatory wall thickening and fat stranding of the descending and sigmoid colon, new since prior, consistent with nonspecific infectious, inflammatory, or ischemic colitis.  Also new dense consolidative airspace opacity in the right lower and right middle lobes 8/3 further weaning NE. Weaning sedation. Full code, full scope per family meeting 8/2. Eraxis stopped (8/1-8/3) 8/4 off pressors, plt down to 103.  8/5 remains off pressors. Moving around more, not really following commands though  8/9 failed extubation 8/8, tolerated nasal cannula for about an hour, desaturations and inability to  clear secretions-reintubated 8/8 8/10 some agitation overnight 8/11 tracheostomy placement On trach collar since 8/16 8/22 pulled trach out, replaced under bronch guidance. Has proximal area of granulation tissue that is above the bottom of the trach tube.  8/25 Removed Trach, unable to reinsert, despite multiple attempts re-intubated 8/29 Anemia, 1 unit PRBC. ENT re-placed Trach  Interim History / Subjective:  Tolerating trach collar Still has copious secretions requiring multiple suctioning-appears to be improving  Objective   Blood pressure (!) 150/81, pulse 93, temperature 98.1 F (36.7 C), temperature source Axillary, resp. rate 18, height 5\' 8"  (1.727 m), weight 104.8 kg, SpO2 98 %.    FiO2 (%):  [28 %] 28 %   Intake/Output Summary (Last 24 hours) at 01/10/2021 1014 Last data filed at 01/10/2021 0900 Gross per 24 hour  Intake 720 ml  Output 850 ml  Net -130 ml   Filed Weights   01/08/21 1530 01/09/21 0413 01/10/21 0500  Weight: 103 kg 104.4 kg 104.8 kg     Exam:  General: Middle-aged, does not appear to be in acute distress  HEENT: On HTN, trach collar in place Neuro: Alert, follows commands CV: S1-S2 appreciated PULM: Fair air entry with no added sounds GI: Bowel sounds appreciated, stoma >> pink, bag intact  Extremities: -edema  Skin: no rashes or lesions. R chest wall port-a-cath, left tunneled HD cath   Resolved Hospital Problem list   Shock  HHS Afib RVR  Lactic Acidosis  Thrombocytopenia Colitis  Assessment & Plan:  Acute hypoxemic respiratory failure status post tracheostomy Endotracheal granulation tissue Aspiration pneumonia Obstructive sleep apnea Pseudomonas in tracheal aspirate-treated -Status post tracheostomy revision 8/29 by ENT -Continues to tolerate trach collar -Continue trach care  Acute kidney  injury on chronic kidney disease stage IIIb Hyponatremia -CRRT initiated 7/31 -Intermittent hemodialysis on 8/12 -Nephrology continues to  assist with care -Hemodialysis Tuesday Thursday and Saturday -Continue to monitor electrolytes  Diarrhea -Appears to be improving -Continue fibers  Colorectal cancer Completed chemotherapy on July 2022 -Followed by Dr. Benay Spice  Lower extremity DVT -Continue Eliquis  Diabetes -SSI with moderate scale -Levemir 15 units twice daily  Epilepsy -On Keppra  Remains a full code  History of chronic pain -As needed Tylenol and oxycodone -Scheduled clonazepam -On home gabapentin  Needs revision trach tract to mature prior to being transitioned to medical floor  Best Practice  DVT: full dose anticoagulation  SUP: PPI Nutrition: On tube feeds Family: Wife and daughter updated bedside 8/30 PT/OT/ST ordered   The patient is critically ill with multiple organ systems failure and requires high complexity decision making for assessment and support, frequent evaluation and titration of therapies, application of advanced monitoring technologies and extensive interpretation of multiple databases. Critical Care Time devoted to patient care services described in this note independent of APP/resident time (if applicable)  is 30 minutes.   Sherrilyn Rist MD [facility] Personal pager: See Amion If unanswered, please page CCM On-call: [phone]

## 2021-01-10 NOTE — Progress Notes (Signed)
Patient ID: BRENNER VISCONTI, male   DOB: 07/04/1955, 65 y.o.   MRN: [id number] S: no complaints, no acute events. O:BP (!) 142/87   Pulse 90   Temp 98.1 F (36.7 C) (Axillary)   Resp (!) 23   Ht 5' 8"  (1.727 m)   Wt 104.8 kg   SpO2 100%   BMI 35.13 kg/m   Intake/Output Summary (Last 24 hours) at 01/10/2021 0802 Last data filed at 01/09/2021 2000 Gross per 24 hour  Intake 785 ml  Output 850 ml  Net -65 ml   Intake/Output: I/O last 3 completed shifts: In: 1150 [NG/GT:1150] Out: 1250 [Urine:450; Stool:800]  Intake/Output this shift:  No intake/output data recorded. Weight change: 0 kg Gen:on vent via trach in mild distress CVS: rrr Resp: trach in place, normal wob Abd: +BS, soft, NT/ND, ostomy in place Ext: no edema Neuro: awake, interactive HD access: [facility]  Recent Labs  Lab 01/04/21 0016 01/05/21 0306 01/06/21 0511 01/06/21 1222 01/07/21 0352 01/07/21 0903 01/07/21 1943 01/08/21 0408 01/09/21 0350 01/10/21 0330  NA 132* 133* 134*  --  135 136 138 135 136 136  K 3.3* 3.8 3.5  --  3.6 3.1* 3.2* 3.5 2.9* 3.9  CL 93* 100 98  --  101 102 102 101 103 100  CO2 24 19* 17*  --  17* 23 22 21* 22 23  GLUCOSE 128* 87 148*  --  127* 123* 110* 109* 128* 161*  BUN 23 43* 57*  --  53* 25* 33* 40* 24* 47*  CREATININE 2.71* 3.85* 4.88*  --  4.08* 2.49* 2.95* 3.40* 2.41* 3.73*  ALBUMIN 1.9* 1.9* 1.9*  --  1.8*  --   --  1.9* 1.8* 2.2*  CALCIUM 8.6* 8.8* 9.7  --  8.5* 7.8* 8.2* 8.5* 7.8* 9.3  PHOS 4.3 5.4*  5.6* 8.0* 8.4* 6.9*  --   --  4.5 3.5  3.9 5.3*   Liver Function Tests: Recent Labs  Lab 01/08/21 0408 01/09/21 0350 01/10/21 0330  ALBUMIN 1.9* 1.8* 2.2*   No results for input(s): LIPASE, AMYLASE in the last 168 hours. No results for input(s): AMMONIA in the last 168 hours. CBC: Recent Labs  Lab 01/06/21 0511 01/06/21 2157 01/07/21 0352 01/09/21 0350 01/10/21 0330  WBC 13.3* 12.0* 10.8* 12.0* 12.4*  NEUTROABS  --   --   --   --  8.0*  HGB 8.2* 7.9* 8.3* 8.2*  8.2*  HCT 24.1* 23.9* 24.5* 25.2* 25.7*  MCV 87.0 88.2 86.0 87.5 87.7  PLT 274 287 332 342 381   Cardiac Enzymes: No results for input(s): CKTOTAL, CKMB, CKMBINDEX, TROPONINI in the last 168 hours. CBG: Recent Labs  Lab 01/09/21 1527 01/09/21 1924 01/09/21 2331 01/10/21 0338 01/10/21 0723  GLUCAP 113* 120* 190* 151* 130*    Iron Studies:  Recent Labs    01/07/21 1055  IRON 33*  TIBC 239*   Studies/Results: DG CHEST PORT 1 VIEW  Result Date: 01/08/2021 CLINICAL DATA:  Chest pain EXAM: PORTABLE CHEST 1 VIEW COMPARISON:  Chest x-ray dated January 03, 2021 FINDINGS: Cardiac and mediastinal contours are unchanged and within normal limits. Interval placement of tracheostomy tube. Unchanged position of right chest wall port, left central venous line and enteric feeding tube. Click Decreased right basilar atelectasis. No new parenchymal opacities. No large pleural effusion or pneumothorax. IMPRESSION: Decreased left basilar atelectasis.  No new parenchymal opacities. Electronically Signed   By: Yetta Glassman M.D.   On: 01/08/2021 10:33  acetaminophen  1,000 mg Per Tube Q8H   amLODipine  5 mg Per Tube Daily   apixaban  5 mg Per Tube BID   vitamin C  500 mg Per Tube TID   atorvastatin  20 mg Per Tube Daily   chlorhexidine gluconate (MEDLINE KIT)  15 mL Mouth Rinse BID   Chlorhexidine Gluconate Cloth  6 each Topical Daily   clonazepam  0.5 mg Per Tube BID   feeding supplement (PROSource TF)  45 mL Per Tube BID   fiber  1 packet Per Tube BID   gabapentin  100 mg Per Tube Daily   And   gabapentin  100 mg Per Tube Once per day on Tue Thu Sat   hydrocerin   Topical BID   insulin aspart  0-15 Units Subcutaneous Q4H   insulin detemir  15 Units Subcutaneous BID   levETIRAcetam  500 mg Per Tube BID   mouth rinse  15 mL Mouth Rinse 10 times per day   multivitamin  1 tablet Per Tube QHS   pantoprazole sodium  40 mg Per Tube BID   QUEtiapine  50 mg Per Tube BID   thiamine  100 mg Per  Tube Daily    BMET    Component Value Date/Time   NA 136 01/10/2021 0330   K 3.9 01/10/2021 0330   CL 100 01/10/2021 0330   CO2 23 01/10/2021 0330   GLUCOSE 161 (H) 01/10/2021 0330   BUN 47 (H) 01/10/2021 0330   CREATININE 3.73 (H) 01/10/2021 0330   CREATININE 1.83 (H) 11/19/2020 0914   CALCIUM 9.3 01/10/2021 0330   GFRNONAA 17 (L) 01/10/2021 0330   GFRNONAA 41 (L) 11/19/2020 0914   GFRAA >60 04/01/2017 1921   CBC    Component Value Date/Time   WBC 12.4 (H) 01/10/2021 0330   RBC 2.93 (L) 01/10/2021 0330   HGB 8.2 (L) 01/10/2021 0330   HGB 11.2 (L) 11/19/2020 0914   HCT 25.7 (L) 01/10/2021 0330   PLT 381 01/10/2021 0330   PLT 212 11/19/2020 0914   MCV 87.7 01/10/2021 0330   MCH 28.0 01/10/2021 0330   MCHC 31.9 01/10/2021 0330   RDW 16.9 (H) 01/10/2021 0330   LYMPHSABS 1.8 01/10/2021 0330   MONOABS 2.0 (H) 01/10/2021 0330   EOSABS 0.4 01/10/2021 0330   BASOSABS 0.1 01/10/2021 0330      Assessment/Plan:   Dialysis-dependent AKI/CKD stage IIIb - presumably ischemic ATN in setting of severe sepsis and MSOF and remains oliguric.  CRRT initiated 7/31 and transitioned to IHD on 12/19/20.  S/p [facility] placement 12/22/20.  Continue with TTS schedule for now, HD today Anasarca - improved with HD AMS - h/o CVA and seizure disorder. Acute hypoxic respiratory failure/VDRF - aspiration pneumonia.  s/p tracheostomy, however pt removed it on 01/01/21 s/p intubation.  New tracheostomy created 01/05/21 by ENT.  Right leg DVT - on eliquis Thrombocytopenia - presumably due to critical illness.  Hematology following and HITT panel pending.  Heparin stopped 12/12/20 after starting argatroban for RLE DVT.  Platelets up to 622   Metabolic acidosis - nongap. Resolved/improver.  managing with IHD Colon cancer stage IIIB - s/p partial colectomy, colostomy.  S/p 3 cycles of chemo.   Atrial fibrillation -off amiodarone and rate controlled. Anemia of critical illness- transfuse prn.  discussed with  pharmacy, team okay with iron- fe load ordered, hold esa Acute metabolic encephalopathy - on fent gtt, zygpreza, and changing to precedex gtt. Hypophosphatemia - improved after IV repletion.  Continue to follow.  Disposition - poor overall prognosis.  He is not suitable for outpatient HD, doubt he will be candidate for LTC facility.  Palliative care consult (initially seen 12/08/20).  Full code  Gean Quint, MD [facility]

## 2021-01-10 NOTE — Progress Notes (Signed)
Dose of Neurontin changed  to 100 3 times daily

## 2021-01-11 DIAGNOSIS — J9601 Acute respiratory failure with hypoxia: Secondary | ICD-10-CM | POA: Diagnosis not present

## 2021-01-11 DIAGNOSIS — E1100 Type 2 diabetes mellitus with hyperosmolarity without nonketotic hyperglycemic-hyperosmolar coma (NKHHC): Secondary | ICD-10-CM | POA: Diagnosis not present

## 2021-01-11 DIAGNOSIS — N171 Acute kidney failure with acute cortical necrosis: Secondary | ICD-10-CM | POA: Diagnosis not present

## 2021-01-11 DIAGNOSIS — G9341 Metabolic encephalopathy: Secondary | ICD-10-CM | POA: Diagnosis not present

## 2021-01-11 LAB — CBC
HCT: 25.8 % — ABNORMAL LOW (ref 39.0–52.0)
Hemoglobin: 8.6 g/dL — ABNORMAL LOW (ref 13.0–17.0)
MCH: 29 pg (ref 26.0–34.0)
MCHC: 33.3 g/dL (ref 30.0–36.0)
MCV: 86.9 fL (ref 80.0–100.0)
Platelets: 326 10*3/uL (ref 150–400)
RBC: 2.97 MIL/uL — ABNORMAL LOW (ref 4.22–5.81)
RDW: 16.8 % — ABNORMAL HIGH (ref 11.5–15.5)
WBC: 13.3 10*3/uL — ABNORMAL HIGH (ref 4.0–10.5)
nRBC: 0 % (ref 0.0–0.2)

## 2021-01-11 LAB — RENAL FUNCTION PANEL
Albumin: 2.3 g/dL — ABNORMAL LOW (ref 3.5–5.0)
Anion gap: 13 (ref 5–15)
BUN: 28 mg/dL — ABNORMAL HIGH (ref 8–23)
CO2: 24 mmol/L (ref 22–32)
Calcium: 8.9 mg/dL (ref 8.9–10.3)
Chloride: 99 mmol/L (ref 98–111)
Creatinine, Ser: 2.71 mg/dL — ABNORMAL HIGH (ref 0.61–1.24)
GFR, Estimated: 25 mL/min — ABNORMAL LOW (ref >60)
Glucose, Bld: 166 mg/dL — ABNORMAL HIGH (ref 70–99)
Phosphorus: 2.7 mg/dL (ref 2.5–4.6)
Potassium: 3.8 mmol/L (ref 3.5–5.1)
Sodium: 136 mmol/L (ref 135–145)

## 2021-01-11 LAB — GLUCOSE, CAPILLARY
Glucose-Capillary: 160 mg/dL — ABNORMAL HIGH (ref 70–99)
Glucose-Capillary: 165 mg/dL — ABNORMAL HIGH (ref 70–99)
Glucose-Capillary: 155 mg/dL — ABNORMAL HIGH (ref 70–99)
Glucose-Capillary: 134 mg/dL — ABNORMAL HIGH (ref 70–99)
Glucose-Capillary: 146 mg/dL — ABNORMAL HIGH (ref 70–99)

## 2021-01-11 MED ORDER — ROCURONIUM BROMIDE 10 MG/ML (PF) SYRINGE
PREFILLED_SYRINGE | INTRAVENOUS | Status: AC
  Filled 2021-01-11: qty 10

## 2021-01-11 MED ORDER — FENTANYL CITRATE PF 50 MCG/ML IJ SOSY
PREFILLED_SYRINGE | INTRAMUSCULAR | Status: AC
  Filled 2021-01-11: qty 2

## 2021-01-11 MED ORDER — MIDAZOLAM HCL 2 MG/2ML IJ SOLN
INTRAMUSCULAR | Status: AC
  Filled 2021-01-11: qty 2

## 2021-01-11 MED ORDER — ETOMIDATE 2 MG/ML IV SOLN
INTRAVENOUS | Status: AC
  Filled 2021-01-11: qty 20

## 2021-01-11 MED ORDER — SUCCINYLCHOLINE CHLORIDE 200 MG/10ML IV SOSY
PREFILLED_SYRINGE | INTRAVENOUS | Status: AC
  Filled 2021-01-11: qty 10

## 2021-01-11 NOTE — Progress Notes (Signed)
Patient ID: Thomas [name], male   DOB: 1955-05-21, 65 y.o.   MRN: [id number] S: no complaints, no acute events. Tolerated hd yesterday net uf 1085cc O:BP (!) 159/76   Pulse 95   Temp 98.4 F (36.9 C) (Axillary)   Resp 20   Ht _0  (1.727 m)   Wt 100.4 kg   SpO2 100%   BMI 33.65 kg/m   Intake/Output Summary (Last 24 hours) at 01/11/2021 0815 Last data filed at 01/11/2021 0400 Gross per 24 hour  Intake 1320 ml  Output 2460 ml  Net -1140 ml   Intake/Output: I/O last 3 completed shifts: In: 2400 [NG/GT:2400] Out: 2460 [Other:1085; JJOAC:1660]  Intake/Output this shift:  No intake/output data recorded. Weight change: 0 kg Gen: nad, sleeping comfortably CVS: rrr Resp: trach in place, normal wob Abd: +BS, soft, NT/ND, ostomy in place Ext: no edema Neuro: interactive HD access: [facility]  Recent Labs  Lab 01/05/21 0306 01/06/21 0511 01/06/21 1222 01/07/21 0352 01/07/21 0903 01/07/21 1943 01/08/21 0408 01/09/21 0350 01/10/21 0330 01/11/21 0356  NA 133* 134*  --  135 136 138 135 136 136 136  K 3.8 3.5  --  3.6 3.1* 3.2* 3.5 2.9* 3.9 3.8  CL 100 98  --  101 102 102 101 103 100 99  CO2 19* 17*  --  17* 23 22 21* _1 GLUCOSE 87 148*  --  127* 123* 110* 109* 128* 161* 166*  BUN 43* 57*  --  53* 25* 33* 40* 24* 47* 28*  CREATININE 3.85* 4.88*  --  4.08* 2.49* 2.95* 3.40* 2.41* 3.73* 2.71*  ALBUMIN 1.9* 1.9*  --  1.8*  --   --  1.9* 1.8* 2.2* 2.3*  CALCIUM 8.8* 9.7  --  8.5* 7.8* 8.2* 8.5* 7.8* 9.3 8.9  PHOS 5.4*  5.6* 8.0* 8.4* 6.9*  --   --  4.5 3.5  3.9 5.3* 2.7   Liver Function Tests: Recent Labs  Lab 01/09/21 0350 01/10/21 0330 01/11/21 0356  ALBUMIN 1.8* 2.2* 2.3*   No results for input(s): LIPASE, AMYLASE in the last 168 hours. No results for input(s): AMMONIA in the last 168 hours. CBC: Recent Labs  Lab 01/06/21 2157 01/07/21 0352 01/09/21 0350 01/10/21 0330 01/11/21 0356  WBC 12.0* 10.8* 12.0* 12.4* 13.3*  NEUTROABS  --   --   --  8.0*  --   HGB  7.9* 8.3* 8.2* 8.2* 8.6*  HCT 23.9* 24.5* 25.2* 25.7* 25.8*  MCV 88.2 86.0 87.5 87.7 86.9  PLT 287 332 342 381 326   Cardiac Enzymes: No results for input(s): CKTOTAL, CKMB, CKMBINDEX, TROPONINI in the last 168 hours. CBG: Recent Labs  Lab 01/10/21 1507 01/10/21 1923 01/10/21 2338 01/11/21 0322 01/11/21 0720  GLUCAP 134* 167* 159* 160* 165*    Iron Studies:  No results for input(s): IRON, TIBC, TRANSFERRIN, FERRITIN in the last 72 hours.  Studies/Results: No results found.  acetaminophen  1,000 mg Per Tube Q8H   amLODipine  5 mg Per Tube Daily   apixaban  5 mg Per Tube BID   vitamin C  500 mg Per Tube TID   atorvastatin  20 mg Per Tube Daily   chlorhexidine gluconate (MEDLINE KIT)  15 mL Mouth Rinse BID   Chlorhexidine Gluconate Cloth  6 each Topical Daily   clonazepam  0.5 mg Per Tube BID   feeding supplement (PROSource TF)  45 mL Per Tube BID   fiber  1 packet Per Tube BID  gabapentin  100 mg Per Tube Q8H   hydrocerin   Topical BID   insulin aspart  0-15 Units Subcutaneous Q4H   insulin detemir  15 Units Subcutaneous BID   levETIRAcetam  500 mg Per Tube BID   mouth rinse  15 mL Mouth Rinse 10 times per day   multivitamin  1 tablet Per Tube QHS   pantoprazole sodium  40 mg Per Tube BID   QUEtiapine  50 mg Per Tube BID   thiamine  100 mg Per Tube Daily    BMET    Component Value Date/Time   NA 136 01/11/2021 0356   K 3.8 01/11/2021 0356   CL 99 01/11/2021 0356   CO2 24 01/11/2021 0356   GLUCOSE 166 (H) 01/11/2021 0356   BUN 28 (H) 01/11/2021 0356   CREATININE 2.71 (H) 01/11/2021 0356   CREATININE 1.83 (H) 11/19/2020 0914   CALCIUM 8.9 01/11/2021 0356   GFRNONAA 25 (L) 01/11/2021 0356   GFRNONAA 41 (L) 11/19/2020 0914   GFRAA >60 04/01/2017 1921   CBC    Component Value Date/Time   WBC 13.3 (H) 01/11/2021 0356   RBC 2.97 (L) 01/11/2021 0356   HGB 8.6 (L) 01/11/2021 0356   HGB 11.2 (L) 11/19/2020 0914   HCT 25.8 (L) 01/11/2021 0356   PLT 326  01/11/2021 0356   PLT 212 11/19/2020 0914   MCV 86.9 01/11/2021 0356   MCH 29.0 01/11/2021 0356   MCHC 33.3 01/11/2021 0356   RDW 16.8 (H) 01/11/2021 0356   LYMPHSABS 1.8 01/10/2021 0330   MONOABS 2.0 (H) 01/10/2021 0330   EOSABS 0.4 01/10/2021 0330   BASOSABS 0.1 01/10/2021 0330      Assessment/Plan:   Dialysis-dependent AKI/CKD stage IIIb - presumably ischemic ATN in setting of severe sepsis and MSOF and remains oliguric.  CRRT initiated 7/31 and transitioned to IHD on 12/19/20.  S/p [facility] placement 12/22/20.  Continue with TTS schedule for now Anasarca - improved with HD AMS - h/o CVA and seizure disorder. Acute hypoxic respiratory failure/VDRF - aspiration pneumonia.  s/p tracheostomy, however pt removed it on 01/01/21 s/p intubation.  New tracheostomy created 01/05/21 by ENT.  Right leg DVT - on eliquis Thrombocytopenia - presumably due to critical illness.  Hematology following and HITT panel pending.  Heparin stopped 12/12/20 after starting argatroban for RLE DVT.  Platelets up to 893   Metabolic acidosis - nongap. Resolved/improver.  managing with IHD Colon cancer stage IIIB - s/p partial colectomy, colostomy.  S/p 3 cycles of chemo.   Atrial fibrillation -off amiodarone and rate controlled. Anemia of critical illness- transfuse prn.  discussed with pharmacy, team okay with iron- fe load ordered, hold esa Acute metabolic encephalopathy - on fent gtt, zygpreza, and changing to precedex gtt. Hypophosphatemia - improved after IV repletion.  Continue to follow.  Disposition - poor overall prognosis.  He is not suitable for outpatient HD, doubt he will be candidate for LTC facility.  Palliative care consult (initially seen 12/08/20).  Full code  Gean Quint, MD [facility]

## 2021-01-11 NOTE — Progress Notes (Signed)
PROGRESS NOTE  Thomas [name]  XBD:[id number] DOB: Nov 12, 1955 DOA: 11/30/2020 PCP: [name], NP   Brief Narrative: Thomas [name] is a 65 y.o. male with a history of colon cancer s/p resection and colostomy May 2022 s/p 3 cycles chemotherapy (last was July 2022), T2DM with neuropathy, HTN, HLD who presented to the ED 7/24 with abdominal pain found to be febrile, tachycardic, tachypneic. Lactic acid was 6.6 and persisted at 6.8 after sepsis IV fluid bolus and cefepime. He had ARF, demand ischemia, bandemia, was hyperglycemic with negative ketones, with lactic acidosis. He developed AFib with RVR in the ED, was started on diltiazem, and was admitted to the ICU for septic shock with unclear source. Sepsis was subsequently felt to be ruled out at time of admission, presentation felt more likely to be related to cepacitabine toxicity with oral mucositis, hand/foot syndrome, and diarrhea/high output colostomy. He was transferred to hospitalist service 7/27 but developed metabolic encephalopathy with hyoxia due to aspiration pneumonia on 7/30 requiring intubation and pressor support. Failed extubation with inability to clear secretions, requiring reintubation 8/8, and tracheostomy subsequently placed 8/11. This was pulled out 8/22, replaced under bronch guidance, again removed 8/25 requiring reintubation after failed reinsertion. ENT revised tracheostomy in the OR 8/29. He has required IHD for ARF, and also had complications of RLE DVT now on eliquis, colitis thought to be ischemic 8/2 not requiring specific intervention, Pseudomonas pneumonia having completed treatment, agitated encephalopathy which has improved, anemia requiring 4u PRBCs total, and thrombocytopenia which has resolved. On 9/4 he was transferred to hospitalist service having tolerated trach collar.   Assessment & Plan: Principal Problem:   Diabetes mellitus with hyperosmolarity without hyperglycemic hyperosmolar nonketotic coma (HCC) Active  Problems:   Lactic acidosis   Acute renal failure superimposed on stage 2 chronic kidney disease (HCC)   Metabolic acidosis   Tachycardia   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 (HCC)   Adverse effect of chemotherapy   Pressure injury of skin   Atrial fibrillation with RVR (HCC)   Sepsis (HCC)   Malnutrition of moderate degree   Acute metabolic encephalopathy   Acute respiratory failure with hypoxemia (HCC)   Acute renal failure with acute cortical necrosis (HCC)   Shock ([place])   Status post tracheostomy ([place])  Acute hypoxic respiratory failure s/p tracheostomy due to aspiration pneumonia (s/p cefepime/flagyl x7 days): Later had Pseudomonas pneumonia (s/p cefepime 8/17 - 8/21). Leukocytosis remains low grade without fever or worsening oxygenation consistent with either stress/reactive leukocytosis or persistent microaspiration.  - Continue trach collar following trach revision by ENT 8/29. PCCM following intermittently for management.  - Last CXR 9/1 reviewed, left base opacity improved.  Acute renal failure on stage IIIb CKD: Presumed ischemic ATN, started CRRT 7/31 > iHD 8/12 s/p left IJ [facility] 8/15.   - Continue HD TTS thru L IJ TDC per nephrology. They comment that he is not a suitable candidate for outpatient HD. Palliative care has been consulted since 8/1.   High output ostomy, diarrhea: Negative C. diff PCR (7/26) and GI pathogen panel (8/1). Possibly ischemic colitis on CT 8/2 - Continue prn imodium  - Monitor output to maintain euvolemia  Multifactorial anemia: Anemia of critical illness, acute blood loss as well as anemia of chronic disease and iron deficiency:  - s/p 4u PRBCs during this hospitalization in total. Will continue monitoring for bleeding and intermittent labs.  - Ok to continue anticoagulation for DVT at this time.  - Getting IV iron  TTS x8 doses per nephrology.  Stage IIIb colon CA s/p partial transverse colectomy with end colostomy May  2022: Followed by Dr. Benay Spice. Started CAPOX June 2022, reduced cepecitabine dose starting 11/19/2020 due to hand/foot syndrome, reduced oxaliplatin due to renal impairment and poor performance status.  - Dr. Benay Spice has followed during this hospitalization. Per note 8/2 "[pt] is undergoing curative therapy for colon cancer..." - Palliative care has been involved during this hospitalization, though goals remain clear: full aggressive measures desired.   Moderate protein calorie malnutrition in setting of obesity:  - Continue tube feeds, MVM, prostat per RD  Acute right leg DVT: Involving CFV, SF junction, proximal femoral and profunda veins and popliteal veins. Dx 8/5.  - Continue eliquis  New onset atrial fibrillation with RVR: Has converted to NSR  - No rate or rhythm control agents required at this time. - Continue eliquis as above  Seizure disorder:  - Continue keppra  Acute metabolic encephalopathy, agitated delirium:  - Stable currently. Continue delirium precautions - Continue clonazepam 0.46m per tube BID and prn - Continue quetiapine 531mBID  T2DM with neuropathy: HbA1c 8.7% at admission. - CBGs well-controlled currently, continue levemir 15u BID and SSI q4h.  - Continue gabapentin 10070m8h.  HTN:  - Continue norvasc  HLD:  - Continue atorvastatin  Thrombocytopenia: Transient likely due to critical illness. HIT Ab negative on 8/5.   SUP: Continue PPI  Hypokalemia: Managed with dialysis.  Meningioma: s/p resection 2018  Sepsis, initially thought to be present on admission has been ruled out.   DVT prophylaxis: Eliquis Code Status: Full Family Communication: None at bedside Disposition Plan:  Status is: Inpatient  Remains inpatient appropriate because:Persistent severe electrolyte disturbances, Altered mental status, Unsafe d/c plan, and IV treatments appropriate due to intensity of illness or inability to take PO  Dispo: The patient is from: Home               Anticipated d/c is to:  TBD              Patient currently is not medically stable to d/c.   Consultants:  PCCM General surgery Nephrology Hematology/oncology Palliative care medicine team  Procedures:  2u PRBC 7/31, 1u PRBC 8/17, 1u PRBC 8/27 Tracheostomy revision 01/05/2021 Dr. RosConstance Holsterntimicrobials: Vancomycin 7/24 Cefepime 7/24 - 7/27 Unasyn 7/30 >> cefepime/flagyl 7/31 - 8/6   Subjective: Pain is controlled, he is able to control secretions by suctioning himself, denies any recent changes/dyspnea or pain.   Objective: Vitals:   01/11/21 1000 01/11/21 1100 01/11/21 1112 01/11/21 1200  BP: (!) 155/70 132/74  (!) 149/76  Pulse: 98 99  99  Resp: (!) 22 (!) 24  (!) 21  Temp:   98.8 F (37.1 C)   TempSrc:   Axillary   SpO2: 95% 97%  94%  Weight:      Height:        Intake/Output Summary (Last 24 hours) at 01/11/2021 1429 Last data filed at 01/11/2021 1200 Gross per 24 hour  Intake 1235 ml  Output 1660 ml  Net -425 ml   Filed Weights   01/10/21 1200 01/10/21 1515 01/11/21 0500  Weight: 104.8 kg 103.3 kg 100.4 kg    Gen: Chronically ill-appearing male in no distress Pulm: Non-labored, even thru trach. No adventitious sounds bilaterally.  CV: Regular rate and rhythm. No murmur, rub, or gallop. No JVD, no pitting pedal edema. GI: Abdomen soft, slightly distended but nontender with pink stoma and light brown loose  output from RUQ colostomy.  Ext: Warm, no deformities Skin: No rashes, lesions or ulcers on visualized skin. Right chest port c/d/I. Left TDC c/d/I. Neuro: Alert and oriented. No focal neurological deficits. Psych: Judgement and insight appear normal. Mood & affect appropriate.   Data Reviewed: I have personally reviewed following labs and imaging studies  CBC: Recent Labs  Lab 01/06/21 2157 01/07/21 0352 01/09/21 0350 01/10/21 0330 01/11/21 0356  WBC 12.0* 10.8* 12.0* 12.4* 13.3*  NEUTROABS  --   --   --  8.0*  --   HGB 7.9* 8.3* 8.2* 8.2* 8.6*   HCT 23.9* 24.5* 25.2* 25.7* 25.8*  MCV 88.2 86.0 87.5 87.7 86.9  PLT 287 332 342 381 131   Basic Metabolic Panel: Recent Labs  Lab 01/05/21 0306 01/06/21 0511 01/06/21 1222 01/07/21 0352 01/07/21 0903 01/07/21 1943 01/08/21 0408 01/09/21 0350 01/10/21 0330 01/11/21 0356  NA 133*   < >  --  135   < > 138 135 136 136 136  K 3.8   < >  --  3.6   < > 3.2* 3.5 2.9* 3.9 3.8  CL 100   < >  --  101   < > 102 101 103 100 99  CO2 19*   < >  --  17*   < > 22 21* 22 23 24   GLUCOSE 87   < >  --  127*   < > 110* 109* 128* 161* 166*  BUN 43*   < >  --  53*   < > 33* 40* 24* 47* 28*  CREATININE 3.85*   < >  --  4.08*   < > 2.95* 3.40* 2.41* 3.73* 2.71*  CALCIUM 8.8*   < >  --  8.5*   < > 8.2* 8.5* 7.8* 9.3 8.9  MG 2.0  --  1.9  --   --   --   --  1.8 1.9  --   PHOS 5.4*  5.6*   < > 8.4* 6.9*  --   --  4.5 3.5  3.9 5.3* 2.7   < > = values in this interval not displayed.   GFR: Estimated Creatinine Clearance: 31.6 mL/min (A) (by C-G formula based on SCr of 2.71 mg/dL (H)). Liver Function Tests: Recent Labs  Lab 01/07/21 0352 01/08/21 0408 01/09/21 0350 01/10/21 0330 01/11/21 0356  ALBUMIN 1.8* 1.9* 1.8* 2.2* 2.3*   No results for input(s): LIPASE, AMYLASE in the last 168 hours. No results for input(s): AMMONIA in the last 168 hours. Coagulation Profile: No results for input(s): INR, PROTIME in the last 168 hours. Cardiac Enzymes: No results for input(s): CKTOTAL, CKMB, CKMBINDEX, TROPONINI in the last 168 hours. BNP (last 3 results) No results for input(s): PROBNP in the last 8760 hours. HbA1C: No results for input(s): HGBA1C in the last 72 hours. CBG: Recent Labs  Lab 01/10/21 1923 01/10/21 2338 01/11/21 0322 01/11/21 0720 01/11/21 1101  GLUCAP 167* 159* 160* 165* 155*   Lipid Profile: No results for input(s): CHOL, HDL, LDLCALC, TRIG, CHOLHDL, LDLDIRECT in the last 72 hours. Thyroid Function Tests: No results for input(s): TSH, T4TOTAL, FREET4, T3FREE, THYROIDAB in  the last 72 hours. Anemia Panel: No results for input(s): VITAMINB12, FOLATE, FERRITIN, TIBC, IRON, RETICCTPCT in the last 72 hours. Urine analysis:    Component Value Date/Time   COLORURINE YELLOW 12/01/2020 0200   APPEARANCEUR CLEAR 12/01/2020 0200   LABSPEC 1.023 12/01/2020 0200   PHURINE 5.0 12/01/2020 0200   GLUCOSEU >=  500 (A) 12/01/2020 0200   HGBUR NEGATIVE 12/01/2020 0200   BILIRUBINUR NEGATIVE 12/01/2020 0200   KETONESUR NEGATIVE 12/01/2020 0200   PROTEINUR 100 (A) 12/01/2020 0200   UROBILINOGEN 0.2 09/27/2014 0301   NITRITE NEGATIVE 12/01/2020 0200   LEUKOCYTESUR NEGATIVE 12/01/2020 0200   Recent Results (from the past 240 hour(s))  MRSA Next Gen by PCR, Nasal     Status: Abnormal   Collection Time: 01/02/21 11:03 AM   Specimen: Nasal Mucosa; Nasal Swab  Result Value Ref Range Status   MRSA by PCR Next Gen (A) NOT DETECTED Final    INVALID, UNABLE TO DETERMINE THE PRESENCE OF TARGET DUE TO SPECIMEN INTEGRITY. RECOLLECTION REQUESTED.    Comment:        The GeneXpert MRSA Assay (FDA approved for NASAL specimens only), is one component of a comprehensive MRSA colonization surveillance program. It is not intended to diagnose MRSA infection nor to guide or monitor treatment for MRSA infections. Performed at [facility], [address] [place], [place] [address]   MRSA Next Gen by PCR, Nasal     Status: None   Collection Time: 01/03/21  3:08 AM   Specimen: Nasal Mucosa; Nasal Swab  Result Value Ref Range Status   MRSA by PCR Next Gen NOT DETECTED NOT DETECTED Final    Comment: (NOTE) The GeneXpert MRSA Assay (FDA approved for NASAL specimens only), is one component of a comprehensive MRSA colonization surveillance program. It is not intended to diagnose MRSA infection nor to guide or monitor treatment for MRSA infections. Test performance is not FDA approved in patients less than 69 years old. Performed at [facility], [address],  [place],  [address]       Radiology Studies: No results found.  Scheduled Meds:  acetaminophen  1,000 mg Per Tube Q8H   amLODipine  5 mg Per Tube Daily   apixaban  5 mg Per Tube BID   vitamin C  500 mg Per Tube TID   atorvastatin  20 mg Per Tube Daily   chlorhexidine gluconate (MEDLINE KIT)  15 mL Mouth Rinse BID   Chlorhexidine Gluconate Cloth  6 each Topical Daily   clonazepam  0.5 mg Per Tube BID   feeding supplement (PROSource TF)  45 mL Per Tube BID   fiber  1 packet Per Tube BID   gabapentin  100 mg Per Tube Q8H   hydrocerin   Topical BID   insulin aspart  0-15 Units Subcutaneous Q4H   insulin detemir  15 Units Subcutaneous BID   levETIRAcetam  500 mg Per Tube BID   mouth rinse  15 mL Mouth Rinse 10 times per day   multivitamin  1 tablet Per Tube QHS   pantoprazole sodium  40 mg Per Tube BID   QUEtiapine  50 mg Per Tube BID   thiamine  100 mg Per Tube Daily   Continuous Infusions:  sodium chloride Stopped (12/20/20 0955)   anticoagulant sodium citrate     feeding supplement (JEVITY 1.5 CAL/FIBER) 1,000 mL (01/11/21 0359)   ferric gluconate (FERRLECIT) IVPB 125 mg (01/10/21 1412)     LOS: 41 days   Time spent: 25 minutes.  Patrecia Pour, MD [facility] [email or web address] 01/11/2021, 2:29 PM

## 2021-01-11 NOTE — Progress Notes (Signed)
NAME:  Thomas [name], MRN:  [id number], DOB:  1955-09-30, LOS: 92 ADMISSION DATE:  11/30/2020, CONSULTATION DATE:  7/30 REFERRING MD:  Riccardo Dubin, CHIEF COMPLAINT:  Worsening hypoxia/dyspnea  History of Present Illness:  65 yo M admitted 7/24 for worsening sepsis vs reaction to chemotherapy with high ostomy output and soft BP on 7/25. Has had issues during his hospitalization with recurrent aspirations and more recently worsening respiratory status.  Most recently pt developed worsening hypoxia and was requiring BIPAP w/ FIO2 of 100% and increased work of breathing.   He was admitted to intensive care and subsequently had worsening clinical status requiring intubation, CRRT, multiple pressors and pneumonia along with CT findings 8/1 concerning for ischemic bowel.  Significant Hospital Events:  7/27 Transferred to [facility], stopped abx as sepsis resolved/ruled out 7/30 AM Worsened AME, more hypoxic with worsening renal funciton, new aspiration on abx restarted 7/30 PM Pt with increased o2 requirement on 100% FIO2 on BIPAP, d/w family who wanted to proceed with intubation, pt intubated 7/31 Tracheal aspirate grew moderate candida tropicalis  8/1 Some issues with tachycardia and hypotension overnight. Reproted high ostomy output.  GI PCR negative 8/2 down to 9 mcg of Levophed, however repeat CT abdomen/pelvis showed inflammatory wall thickening and fat stranding of the descending and sigmoid colon, new since prior, consistent with nonspecific infectious, inflammatory, or ischemic colitis.  Also new dense consolidative airspace opacity in the right lower and right middle lobes 8/3 further weaning NE. Weaning sedation. Full code, full scope per family meeting 8/2. Eraxis stopped (8/1-8/3) 8/4 off pressors, plt down to 103.  8/5 remains off pressors. Moving around more, not really following commands though  8/9 failed extubation 8/8, tolerated nasal cannula for about an hour, desaturations and inability to  clear secretions-reintubated 8/8 8/10 some agitation overnight 8/11 tracheostomy placement On trach collar since 8/16 8/22 pulled trach out, replaced under bronch guidance. Has proximal area of granulation tissue that is above the bottom of the trach tube.  8/25 Removed Trach, unable to reinsert, despite multiple attempts re-intubated 8/29 Anemia, 1 unit PRBC. ENT re-placed Trach  Interim History / Subjective:  Tolerating trach collar well Still with significant secretions  Objective   Blood pressure (!) 159/76, pulse 95, temperature 98.4 F (36.9 C), temperature source Axillary, resp. rate 20, height 5\' 8"  (1.727 m), weight 100.4 kg, SpO2 100 %.    FiO2 (%):  [28 %-40 %] 28 %   Intake/Output Summary (Last 24 hours) at 01/11/2021 0926 Last data filed at 01/11/2021 0400 Gross per 24 hour  Intake 1255 ml  Output 2460 ml  Net -1205 ml   Filed Weights   01/10/21 1200 01/10/21 1515 01/11/21 0500  Weight: 104.8 kg 103.3 kg 100.4 kg     Exam:  General: Middle-aged, does not appear to be in distress HEENT: Trach collar in place Neuro: Alert, follows commands  CV: S1-S2 appreciated PULM: Fair air entry with no added sounds GI: Bowel sounds appreciated, stoma >> pink, bag intact  Extremities: No edema Skin: no rashes or lesions. R chest wall port-a-cath, left tunneled HD cath   Resolved Hospital Problem list   Shock  HHS Afib RVR  Lactic Acidosis  Thrombocytopenia Colitis  Assessment & Plan:  Acute hypoxemic respiratory failure s/p tracheostomy Ended tracheal granulation tissue Aspiration pneumonia Obstructive sleep apnea Pseudomonas and tracheal aspirate-fully treated -Trach revision by ENT on 8/29 -Continues to tolerate trach collar  Acute kidney injury on chronic kidney disease stage IIIb Hyponatremia -On intermittent  hemodialysis -Hemodialysis Tuesday, Thursday and Saturday -Continue to trend electrolytes  Diarrhea -Improving -Continue fibers  Colorectal  cancer Completed chemotherapy July 2022  Lower extremity DVT -Continue Eliquis  Diabetes -On SSI  Epilepsy -On Keppra  History of chronic pain -As needed Tylenol and oxycodone -Scheduled clonazepam -On home gabapentin-dose increased to 100 3 times daily  Transferred to hospitalist service  Will follow intermittently with you  Sherrilyn Rist, MD [facility] Pager: See Shea Evans

## 2021-01-12 DIAGNOSIS — E1100 Type 2 diabetes mellitus with hyperosmolarity without nonketotic hyperglycemic-hyperosmolar coma (NKHHC): Secondary | ICD-10-CM | POA: Diagnosis not present

## 2021-01-12 DIAGNOSIS — G9341 Metabolic encephalopathy: Secondary | ICD-10-CM | POA: Diagnosis not present

## 2021-01-12 DIAGNOSIS — J9601 Acute respiratory failure with hypoxia: Secondary | ICD-10-CM | POA: Diagnosis not present

## 2021-01-12 DIAGNOSIS — N171 Acute kidney failure with acute cortical necrosis: Secondary | ICD-10-CM | POA: Diagnosis not present

## 2021-01-12 LAB — RENAL FUNCTION PANEL
Albumin: 2.3 g/dL — ABNORMAL LOW (ref 3.5–5.0)
Anion gap: 16 — ABNORMAL HIGH (ref 5–15)
BUN: 46 mg/dL — ABNORMAL HIGH (ref 8–23)
CO2: 24 mmol/L (ref 22–32)
Calcium: 9.5 mg/dL (ref 8.9–10.3)
Chloride: 96 mmol/L — ABNORMAL LOW (ref 98–111)
Creatinine, Ser: 3.54 mg/dL — ABNORMAL HIGH (ref 0.61–1.24)
GFR, Estimated: 18 mL/min — ABNORMAL LOW (ref >60)
Glucose, Bld: 163 mg/dL — ABNORMAL HIGH (ref 70–99)
Phosphorus: 5 mg/dL — ABNORMAL HIGH (ref 2.5–4.6)
Potassium: 4.1 mmol/L (ref 3.5–5.1)
Sodium: 136 mmol/L (ref 135–145)

## 2021-01-12 LAB — GLUCOSE, CAPILLARY
Glucose-Capillary: 130 mg/dL — ABNORMAL HIGH (ref 70–99)
Glucose-Capillary: 135 mg/dL — ABNORMAL HIGH (ref 70–99)
Glucose-Capillary: 147 mg/dL — ABNORMAL HIGH (ref 70–99)
Glucose-Capillary: 150 mg/dL — ABNORMAL HIGH (ref 70–99)
Glucose-Capillary: 158 mg/dL — ABNORMAL HIGH (ref 70–99)
Glucose-Capillary: 159 mg/dL — ABNORMAL HIGH (ref 70–99)

## 2021-01-12 MED ORDER — CHLORHEXIDINE GLUCONATE CLOTH 2 % EX PADS
6.0000 | MEDICATED_PAD | Freq: Every day | CUTANEOUS | Status: DC
  Administered 2021-01-12 – 2021-01-14 (×3): 6 via TOPICAL

## 2021-01-12 NOTE — Progress Notes (Signed)
OT Cancellation Note  Patient Details Name: Thomas [name] MRN: [id number] DOB: Mar 24, 1956   Cancelled Treatment:    Reason Eval/Treat Not Completed: Patient at procedure or test/ unavailable. With respiratory, continue efforts as appropriate.    Bryn Saline D Aydrian Halpin 01/12/2021, 4:26 PM

## 2021-01-12 NOTE — Progress Notes (Signed)
  Speech Language Pathology Treatment: Dysphagia;Passy Muir Speaking valve  Patient Details Name: Thomas [name] MRN: [id number] DOB: December 01, 1955 Today's Date: 01/12/2021 Time: 1025-1100 SLP Time Calculation (min) (ACUTE ONLY): 35 min  Assessment / Plan / Recommendation Clinical Impression  SLP followed up for PMSV and dysphagia intervention. Pt on trach collar, RN reports recent suction and that pt has moderate tracheal secretions. Cuff deflated at baesline. PMSV placed without difficulty and no changes in pt vitals; suspect upper respiratory sputum as rattling noted with exhalation. With cueing pt able to expectorate secretions orally and suctioned via yankauer with clearing of rattling upon exhalation. Pt utilized PMSV for 35 minute duration and during PO ice chip trial with SLP. Planned to leave PMSV on during wake hours with supervision as tolerated,  however pt exhibited transient tachypnea (possible anxiety driven) PMSV removed, vitals stabilized. Recommend PMSV use as tolerated during wake hours with staff.   Performed diligent oral care, pt with lingual coating and dried secretions in posterior oral cavity; clearing with extensive oral care by SLP. Trialed single ice chips, pt with prolonged mastication and delayed AP transit. Pt did exhibit some palpable laryngeal bobbing but no definitive swallow initiation was palpable at bedside unless in setting of greatly reduced laryngeal elevation. No overt coughing was exhibited. Continue NPO with PO trials with SLP with plans for future instrumental assessment as pt exhibits clinical readiness.     HPI HPI: Pt is a 65 y/o male admitted on 7/25 for worsening sepsis vs reaction to chemotherapy with high ostomy output and soft BP. Pt developed worsening hypoxia and was requiring BIPAP w/ FIO2 of 100% and increased work of breathing. SLP consulted due to concern for aspiration. BSE 7/28 recommended NPO status and MBS, but this could not completed subsequently  due to lethargy. CXR in AM of 7/30 showed some worsening concern of aspiration. ETT 7/30-8/8; tolerated nasal cannula for about an hour; reintubated 8/8-trach 8/11. Pt pulled out trach x2 8/22; replaced by PCCM. Bronch on 8/22 revealed endotracheal granulation tissue at trach site. Trach removed again on 8/25; unable to reinsert despite multiple attenpts; pt reintubated 8/25- trach by ENT on 8/29. BSE 8/22 limited to ice chips due to pt's refusal of other consistencies; rx NPO status with Cortrak. 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      SLP Plan  Continue with current plan of care       Recommendations  Diet recommendations: NPO Medication Administration: Via alternative means      Patient may use Passy-Muir Speech Valve: Intermittently with supervision;During all waking hours (remove during sleep) PMSV Supervision: Full MD: Please consider changing trach tube to : Cuffless;Smaller size         Oral Care Recommendations: Oral care QID;Staff/trained caregiver to provide oral care Follow up Recommendations: Other (comment) (TBD) SLP Visit Diagnosis: Dysphagia, unspecified (R13.10);Aphonia (R49.1) Plan: Continue with current plan of care       [place], CCC-SLP Acute Rehabilitation Services   01/12/2021, 11:25 AM

## 2021-01-12 NOTE — Progress Notes (Signed)
Suture removed per order. No complications.

## 2021-01-12 NOTE — Plan of Care (Signed)
  Problem: Activity: Goal: Risk for activity intolerance will decrease Outcome: Progressing   Problem: Nutrition: Goal: Adequate nutrition will be maintained Outcome: Progressing   Problem: Coping: Goal: Level of anxiety will decrease Outcome: Progressing   Problem: Elimination: Goal: Will not experience complications related to bowel motility Outcome: Progressing   

## 2021-01-12 NOTE — Progress Notes (Addendum)
PROGRESS NOTE  Thomas [name]  JQB:[id number] DOB: Oct 01, 1955 DOA: 11/30/2020 PCP: [name], NP   Brief Narrative: Thomas [name] is a 65 y.o. male with a history of colon cancer s/p resection and colostomy May 2022 s/p 3 cycles chemotherapy (last was July 2022), T2DM with neuropathy, HTN, HLD who presented to the ED 7/24 with abdominal pain found to be febrile, tachycardic, tachypneic. Lactic acid was 6.6 and persisted at 6.8 after sepsis IV fluid bolus and cefepime. He had ARF, demand ischemia, bandemia, was hyperglycemic with negative ketones, with lactic acidosis. He developed AFib with RVR in the ED, was started on diltiazem, and was admitted to the ICU for septic shock with unclear source. Sepsis was subsequently felt to be ruled out at time of admission, presentation felt more likely to be related to cepacitabine toxicity with oral mucositis, hand/foot syndrome, and diarrhea/high output colostomy. He was transferred to hospitalist service 7/27 but developed metabolic encephalopathy with hyoxia due to aspiration pneumonia on 7/30 requiring intubation and pressor support. Failed extubation with inability to clear secretions, requiring reintubation 8/8, and tracheostomy subsequently placed 8/11. This was pulled out 8/22, replaced under bronch guidance, again removed 8/25 requiring reintubation after failed reinsertion. ENT revised tracheostomy in the OR 8/29. He has required IHD for ARF, and also had complications of RLE DVT now on eliquis, colitis thought to be ischemic 8/2 not requiring specific intervention, Pseudomonas pneumonia having completed treatment, agitated encephalopathy which has improved, anemia requiring 4u PRBCs total, and thrombocytopenia which has resolved. On 9/4 he was transferred to hospitalist service having tolerated trach collar.   Assessment & Plan: Principal Problem:   Diabetes mellitus with hyperosmolarity without hyperglycemic hyperosmolar nonketotic coma (HCC) Active  Problems:   Lactic acidosis   Acute renal failure superimposed on stage 2 chronic kidney disease (HCC)   Metabolic acidosis   Tachycardia   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 (HCC)   Adverse effect of chemotherapy   Pressure injury of skin   Atrial fibrillation with RVR (HCC)   Sepsis (HCC)   Malnutrition of moderate degree   Acute metabolic encephalopathy   Acute respiratory failure with hypoxemia (HCC)   Acute renal failure with acute cortical necrosis (HCC)   Shock ([place])   Status post tracheostomy ([place])  Acute hypoxic respiratory failure s/p tracheostomy due to aspiration pneumonia (s/p cefepime/flagyl x7 days): Later had Pseudomonas pneumonia (s/p cefepime 8/17 - 8/21). Leukocytosis remains low grade without fever or worsening oxygenation consistent with either stress/reactive leukocytosis or persistent microaspiration.  - Continue trach collar following trach revision by ENT 8/29. PCCM following intermittently for management.  - SLP evals continue. PMV 9/5 x35 minutes. - Last CXR 9/1 reviewed, left base opacity improved.  Acute renal failure on stage IIIb CKD: Presumed ischemic ATN, started CRRT 7/31 > iHD 8/12 s/p left [facility] 8/15.   - Continue HD TTS thru L IJ TDC per nephrology. They comment that he is not a suitable candidate for outpatient HD. Palliative care has been consulted since 8/1.   High output ostomy, diarrhea: Negative C. diff PCR (7/26) and GI pathogen panel (8/1). Possibly ischemic colitis on CT 8/2 - Continue prn imodium  - Monitor output to maintain euvolemia  Multifactorial anemia: Anemia of critical illness, acute blood loss as well as anemia of chronic disease and iron deficiency:  - s/p 4u PRBCs during this hospitalization in total. Will continue monitoring for bleeding and intermittent labs.  - Ok to continue anticoagulation for DVT  at this time.  - Getting IV iron TTS x8 doses per nephrology.  Stage IIIb colon CA s/p partial  transverse colectomy with end colostomy May 2022: Followed by Dr. Benay Spice. Started CAPOX June 2022, reduced cepecitabine dose starting 11/19/2020 due to hand/foot syndrome, reduced oxaliplatin due to renal impairment and poor performance status.  - Dr. Benay Spice has followed during this hospitalization. Per note 8/2 "[pt] is undergoing curative therapy for colon cancer..." - Palliative care has been involved during this hospitalization, though goals remain clear: full aggressive measures desired.   Moderate protein calorie malnutrition in setting of obesity:  - Continue tube feeds, MVM, prostat per RD - Remaining NPO per SLP at this time, but will continue working.  Acute right leg DVT: Involving CFV, SF junction, proximal femoral and profunda veins and popliteal veins. Dx 8/5.  - Continue eliquis  New onset atrial fibrillation with RVR: Has converted to NSR  - No rate or rhythm control agents required at this time. - Continue eliquis as above  Seizure disorder:  - Continue keppra  Hand/foot syndrome related to xeloda: Had dose reduced due to this, and of course has not gotten it while admitted.  - No infection noted at this time. Will continue moisturizing topical Tx  Acute metabolic encephalopathy, agitated delirium:  - Stable currently. Continue delirium precautions - Continue clonazepam 0.6m per tube BID and prn - Continue quetiapine 554mBID  T2DM with neuropathy: HbA1c 8.7% at admission. - CBGs well-controlled currently, continue levemir 15u BID and SSI q4h.  - Continue gabapentin 10087m8h.  HTN:  - Continue norvasc  HLD:  - Continue atorvastatin  Thrombocytopenia: Transient likely due to critical illness. HIT Ab negative on 8/5.   SUP: Continue PPI  Hypokalemia: Managed with dialysis.  Meningioma: s/p resection 2018  Sepsis, initially thought to be present on admission has been ruled out.   DVT prophylaxis: Eliquis Code Status: Full Family Communication: None  at bedside Disposition Plan:  Status is: Inpatient  Remains inpatient appropriate because:Persistent severe electrolyte disturbances, Altered mental status, Unsafe d/c plan, and IV treatments appropriate due to intensity of illness or inability to take PO  Dispo: The patient is from: Home              Anticipated d/c is to:  TBD              Patient currently is not medically stable to d/c.   Consultants:  PCCM General surgery Nephrology Hematology/oncology Palliative care medicine team  Procedures:  2u PRBC 7/31, 1u PRBC 8/17, 1u PRBC 8/27 Tracheostomy revision 01/05/2021 Dr. RosConstance Holsterntimicrobials: Vancomycin 7/24 Cefepime 7/24 - 7/27 Unasyn 7/30 >> cefepime/flagyl 7/31 - 8/6   Subjective: CC today is that he wants to talk. He also has right foot irritation. No abd pain, dyspnea. He feels he's getting stronger and is taking this one day at a time.   Objective: Vitals:   01/12/21 0716 01/12/21 0820 01/12/21 1127 01/12/21 1210  BP: (!) 142/62  132/74   Pulse: 94  97   Resp: 20 (!) 23 19 (!) 21  Temp: 98.5 F (36.9 C)  98.3 F (36.8 C)   TempSrc: Oral  Oral   SpO2: 99%  100%   Weight:      Height:        Intake/Output Summary (Last 24 hours) at 01/12/2021 1411 Last data filed at 01/11/2021 2017 Gross per 24 hour  Intake 245 ml  Output 600 ml  Net -355 ml  Filed Weights   01/10/21 1515 01/11/21 0500 01/12/21 0500  Weight: 103.3 kg 100.4 kg 107.2 kg   Gen: 65 y.o. male in no distress Pulm: Nonlabored, even on trach collar. Clear anterolaterally. CV: Regular rate and rhythm. No murmur, rub, or gallop. No JVD, no pitting dependent edema. GI: Abdomen soft, nontender but distended, with normoactive bowel sounds.  Ext: Warm, no deformities Skin: No new rashes, lesions or ulcers on visualized skin. Stoma appears pink, TDC site is without exudate erythema or tenderness.  Neuro: Alert and oriented. No focal neurological deficits. Psych: Judgement and insight appear fair.  Mood euthymic & affect congruent. Behavior is appropriate.    Data Reviewed: I have personally reviewed following labs and imaging studies  CBC: Recent Labs  Lab 01/06/21 2157 01/07/21 0352 01/09/21 0350 01/10/21 0330 01/11/21 0356  WBC 12.0* 10.8* 12.0* 12.4* 13.3*  NEUTROABS  --   --   --  8.0*  --   HGB 7.9* 8.3* 8.2* 8.2* 8.6*  HCT 23.9* 24.5* 25.2* 25.7* 25.8*  MCV 88.2 86.0 87.5 87.7 86.9  PLT 287 332 342 381 680   Basic Metabolic Panel: Recent Labs  Lab 01/06/21 1222 01/07/21 0352 01/08/21 0408 01/09/21 0350 01/10/21 0330 01/11/21 0356 01/12/21 0157  NA  --    < > 135 136 136 136 136  K  --    < > 3.5 2.9* 3.9 3.8 4.1  CL  --    < > 101 103 100 99 96*  CO2  --    < > 21* _0 GLUCOSE  --    < > 109* 128* 161* 166* 163*  BUN  --    < > 40* 24* 47* 28* 46*  CREATININE  --    < > 3.40* 2.41* 3.73* 2.71* 3.54*  CALCIUM  --    < > 8.5* 7.8* 9.3 8.9 9.5  MG 1.9  --   --  1.8 1.9  --   --   PHOS 8.4*   < > 4.5 3.5  3.9 5.3* 2.7 5.0*   < > = values in this interval not displayed.   GFR: Estimated Creatinine Clearance: 25 mL/min (A) (by C-G formula based on SCr of 3.54 mg/dL (H)). Liver Function Tests: Recent Labs  Lab 01/08/21 0408 01/09/21 0350 01/10/21 0330 01/11/21 0356 01/12/21 0157  ALBUMIN 1.9* 1.8* 2.2* 2.3* 2.3*   No results for input(s): LIPASE, AMYLASE in the last 168 hours. No results for input(s): AMMONIA in the last 168 hours. Coagulation Profile: No results for input(s): INR, PROTIME in the last 168 hours. Cardiac Enzymes: No results for input(s): CKTOTAL, CKMB, CKMBINDEX, TROPONINI in the last 168 hours. BNP (last 3 results) No results for input(s): PROBNP in the last 8760 hours. HbA1C: No results for input(s): HGBA1C in the last 72 hours. CBG: Recent Labs  Lab 01/11/21 2024 01/12/21 0004 01/12/21 0333 01/12/21 0718 01/12/21 1125  GLUCAP 146* 147* 158* 150* 135*   Lipid Profile: No results for input(s): CHOL, HDL,  LDLCALC, TRIG, CHOLHDL, LDLDIRECT in the last 72 hours. Thyroid Function Tests: No results for input(s): TSH, T4TOTAL, FREET4, T3FREE, THYROIDAB in the last 72 hours. Anemia Panel: No results for input(s): VITAMINB12, FOLATE, FERRITIN, TIBC, IRON, RETICCTPCT in the last 72 hours. Urine analysis:    Component Value Date/Time   COLORURINE YELLOW 12/01/2020 0200   APPEARANCEUR CLEAR 12/01/2020 0200   LABSPEC 1.023 12/01/2020 0200   PHURINE 5.0 12/01/2020 0200   GLUCOSEU >=500 (A)  12/01/2020 0200   HGBUR NEGATIVE 12/01/2020 0200   BILIRUBINUR NEGATIVE 12/01/2020 0200   KETONESUR NEGATIVE 12/01/2020 0200   PROTEINUR 100 (A) 12/01/2020 0200   UROBILINOGEN 0.2 09/27/2014 0301   NITRITE NEGATIVE 12/01/2020 0200   LEUKOCYTESUR NEGATIVE 12/01/2020 0200   Recent Results (from the past 240 hour(s))  MRSA Next Gen by PCR, Nasal     Status: None   Collection Time: 01/03/21  3:08 AM   Specimen: Nasal Mucosa; Nasal Swab  Result Value Ref Range Status   MRSA by PCR Next Gen NOT DETECTED NOT DETECTED Final    Comment: (NOTE) The GeneXpert MRSA Assay (FDA approved for NASAL specimens only), is one component of a comprehensive MRSA colonization surveillance program. It is not intended to diagnose MRSA infection nor to guide or monitor treatment for MRSA infections. Test performance is not FDA approved in patients less than 82 years old. Performed at [facility], [place] [address], [place], [place] [address]       Radiology Studies: No results found.  Scheduled Meds:  acetaminophen  1,000 mg Per Tube Q8H   amLODipine  5 mg Per Tube Daily   apixaban  5 mg Per Tube BID   vitamin C  500 mg Per Tube TID   atorvastatin  20 mg Per Tube Daily   chlorhexidine gluconate (MEDLINE KIT)  15 mL Mouth Rinse BID   Chlorhexidine Gluconate Cloth  6 each Topical Daily   Chlorhexidine Gluconate Cloth  6 each Topical Q0600   clonazepam  0.5 mg Per Tube BID   feeding supplement (PROSource TF)  45 mL Per  Tube BID   fiber  1 packet Per Tube BID   gabapentin  100 mg Per Tube Q8H   hydrocerin   Topical BID   insulin aspart  0-15 Units Subcutaneous Q4H   insulin detemir  15 Units Subcutaneous BID   levETIRAcetam  500 mg Per Tube BID   mouth rinse  15 mL Mouth Rinse 10 times per day   multivitamin  1 tablet Per Tube QHS   pantoprazole sodium  40 mg Per Tube BID   QUEtiapine  50 mg Per Tube BID   thiamine  100 mg Per Tube Daily   Continuous Infusions:  sodium chloride Stopped (12/20/20 0955)   anticoagulant sodium citrate     feeding supplement (JEVITY 1.5 CAL/FIBER) 65 mL/hr at 01/11/21 1800   ferric gluconate (FERRLECIT) IVPB 125 mg (01/10/21 1412)     LOS: 42 days   Time spent: 35 minutes.  Patrecia Pour, MD [facility] [email or web address] 01/12/2021, 2:11 PM

## 2021-01-12 NOTE — Progress Notes (Addendum)
[facility] NEPHROLOGY PROGRESS NOTE  Assessment/ Plan:  #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 CRRT initiated 7/31 and transitioned to IHD on 12/19/20.  S/p TDC placement 12/22/20.  Continue with TTS schedule for now.  Next HD tomorrow.    # Volume overload/anasarca-improved with dialysis.  # Sepsis/aspiration pneumonia/VDRF:aspiration pneumonia.  Completed antibiotics course.  S/p tracheostomy, however pt removed it on 01/01/21 s/p intubation.  New tracheostomy created 01/05/21 by ENT.   # Stage IIIB colon cancer - sp partial colectomy, colostomy. On chemoRx sp 3 cycles.  # Right leg DVT: On Eliquis.  # Atrial fibrillation - on amiodarone and anticoagulation.    # Anemia of critical illness: Receiving IV iron with HD, transfuse as needed, holding erythropoietin with active malignancy.  #Disposition - poor overall prognosis.  He is not suitable for outpatient HD, doubt he will be candidate for LTC facility.  Palliative care consult (initially seen 12/08/20).  Full code.  Subjective: Seen and examined.  Patient is alert awake.  No new event.  He has tracheostomy.  Objective Vital signs in last 24 hours: Vitals:   01/12/21 0443 01/12/21 0451 01/12/21 0500 01/12/21 0716  BP:    (!) 142/62  Pulse: 92 91  94  Resp: 20 (!) 21  20  Temp:    98.5 F (36.9 C)  TempSrc:    Oral  SpO2: 100% 100%  99%  Weight:   107.2 kg   Height:       Weight change: 2.4 kg  Intake/Output Summary (Last 24 hours) at 01/12/2021 0815 Last data filed at 01/11/2021 2017 Gross per 24 hour  Intake 635 ml  Output 600 ml  Net 35 ml        Labs: Basic Metabolic Panel: Recent Labs  Lab 01/10/21 0330 01/11/21 0356 01/12/21 0157  NA 136 136 136  K 3.9 3.8 4.1  CL 100 99 96*  CO2 23 24 24  GLUCOSE 161* 166* 163*  BUN 47* 28* 46*  CREATININE 3.73* 2.71* 3.54*  CALCIUM 9.3 8.9 9.5  PHOS 5.3* 2.7 5.0*    Liver  Function Tests: Recent Labs  Lab 01/10/21 0330 01/11/21 0356 01/12/21 0157  ALBUMIN 2.2* 2.3* 2.3*    No results for input(s): LIPASE, AMYLASE in the last 168 hours. No results for input(s): AMMONIA in the last 168 hours. CBC: Recent Labs  Lab 01/06/21 2157 01/07/21 0352 01/09/21 0350 01/10/21 0330 01/11/21 0356  WBC 12.0* 10.8* 12.0* 12.4* 13.3*  NEUTROABS  --   --   --  8.0*  --   HGB 7.9* 8.3* 8.2* 8.2* 8.6*  HCT 23.9* 24.5* 25.2* 25.7* 25.8*  MCV 88.2 86.0 87.5 87.7 86.9  PLT 287 332 342 381 326    Cardiac Enzymes: No results for input(s): CKTOTAL, CKMB, CKMBINDEX, TROPONINI in the last 168 hours. CBG: Recent Labs  Lab 01/11/21 1457 01/11/21 2024 01/12/21 0004 01/12/21 0333 01/12/21 0718  GLUCAP 134* 146* 147* 158* 150*     Iron Studies: No results for input(s): IRON, TIBC, TRANSFERRIN, FERRITIN in the last 72 hours. Studies/Results: No results found.  Medications: Infusions:  sodium chloride Stopped (12/20/20 0955)   anticoagulant sodium citrate     feeding supplement (JEVITY 1.5 CAL/FIBER) 65 mL/hr at 01/11/21 1800   ferric gluconate (FERRLECIT) IVPB 125 mg (01/10/21 1412)    Scheduled Medications:  acetaminophen  1,000 mg Per Tube Q8H   amLODipine  5 mg   Per Tube Daily   apixaban  5 mg Per Tube BID   vitamin C  500 mg Per Tube TID   atorvastatin  20 mg Per Tube Daily   chlorhexidine gluconate (MEDLINE KIT)  15 mL Mouth Rinse BID   Chlorhexidine Gluconate Cloth  6 each Topical Daily   clonazepam  0.5 mg Per Tube BID   feeding supplement (PROSource TF)  45 mL Per Tube BID   fiber  1 packet Per Tube BID   gabapentin  100 mg Per Tube Q8H   hydrocerin   Topical BID   insulin aspart  0-15 Units Subcutaneous Q4H   insulin detemir  15 Units Subcutaneous BID   levETIRAcetam  500 mg Per Tube BID   mouth rinse  15 mL Mouth Rinse 10 times per day   multivitamin  1 tablet Per Tube QHS   pantoprazole sodium  40 mg Per Tube BID   QUEtiapine  50 mg Per  Tube BID   thiamine  100 mg Per Tube Daily    have reviewed scheduled and prn medications.  Physical Exam: General: Ill-looking male, opens eyes and alert, has tracheostomy not in distress. Heart:RRR, s1s2 nl Lungs: Coarse breath sounds anteriorly Abdomen:soft, Non-tender, non-distended Extremities: No edema.. Dialysis Access: LIJ TDC in place.    Prasad  01/12/2021,8:15 AM  LOS: 42 days   

## 2021-01-13 ENCOUNTER — Other Ambulatory Visit (HOSPITAL_COMMUNITY): Payer: Self-pay

## 2021-01-13 DIAGNOSIS — E1100 Type 2 diabetes mellitus with hyperosmolarity without nonketotic hyperglycemic-hyperosmolar coma (NKHHC): Secondary | ICD-10-CM | POA: Diagnosis not present

## 2021-01-13 DIAGNOSIS — J9601 Acute respiratory failure with hypoxia: Secondary | ICD-10-CM | POA: Diagnosis not present

## 2021-01-13 DIAGNOSIS — Z930 Tracheostomy status: Secondary | ICD-10-CM | POA: Diagnosis not present

## 2021-01-13 LAB — GLUCOSE, CAPILLARY
Glucose-Capillary: 138 mg/dL — ABNORMAL HIGH (ref 70–99)
Glucose-Capillary: 146 mg/dL — ABNORMAL HIGH (ref 70–99)
Glucose-Capillary: 157 mg/dL — ABNORMAL HIGH (ref 70–99)
Glucose-Capillary: 158 mg/dL — ABNORMAL HIGH (ref 70–99)
Glucose-Capillary: 176 mg/dL — ABNORMAL HIGH (ref 70–99)
Glucose-Capillary: 176 mg/dL — ABNORMAL HIGH (ref 70–99)
Glucose-Capillary: 181 mg/dL — ABNORMAL HIGH (ref 70–99)

## 2021-01-13 LAB — RENAL FUNCTION PANEL
Albumin: 2.3 g/dL — ABNORMAL LOW (ref 3.5–5.0)
Anion gap: 15 (ref 5–15)
BUN: 66 mg/dL — ABNORMAL HIGH (ref 8–23)
CO2: 22 mmol/L (ref 22–32)
Calcium: 9.3 mg/dL (ref 8.9–10.3)
Chloride: 99 mmol/L (ref 98–111)
Creatinine, Ser: 3.81 mg/dL — ABNORMAL HIGH (ref 0.61–1.24)
GFR, Estimated: 17 mL/min — ABNORMAL LOW (ref >60)
Glucose, Bld: 167 mg/dL — ABNORMAL HIGH (ref 70–99)
Phosphorus: 6.2 mg/dL — ABNORMAL HIGH (ref 2.5–4.6)
Potassium: 4.4 mmol/L (ref 3.5–5.1)
Sodium: 136 mmol/L (ref 135–145)

## 2021-01-13 LAB — CBC
HCT: 25.9 % — ABNORMAL LOW (ref 39.0–52.0)
HCT: 26.8 % — ABNORMAL LOW (ref 39.0–52.0)
Hemoglobin: 8.3 g/dL — ABNORMAL LOW (ref 13.0–17.0)
Hemoglobin: 8.6 g/dL — ABNORMAL LOW (ref 13.0–17.0)
MCH: 28.3 pg (ref 26.0–34.0)
MCH: 28.2 pg (ref 26.0–34.0)
MCHC: 32 g/dL (ref 30.0–36.0)
MCHC: 32.1 g/dL (ref 30.0–36.0)
MCV: 88.4 fL (ref 80.0–100.0)
MCV: 87.9 fL (ref 80.0–100.0)
Platelets: 369 10*3/uL (ref 150–400)
Platelets: 385 10*3/uL (ref 150–400)
RBC: 2.93 MIL/uL — ABNORMAL LOW (ref 4.22–5.81)
RBC: 3.05 MIL/uL — ABNORMAL LOW (ref 4.22–5.81)
RDW: 16.6 % — ABNORMAL HIGH (ref 11.5–15.5)
RDW: 16.7 % — ABNORMAL HIGH (ref 11.5–15.5)
WBC: 13.5 10*3/uL — ABNORMAL HIGH (ref 4.0–10.5)
WBC: 12.8 10*3/uL — ABNORMAL HIGH (ref 4.0–10.5)
nRBC: 0 % (ref 0.0–0.2)
nRBC: 0 % (ref 0.0–0.2)

## 2021-01-13 MED ORDER — LIDOCAINE HCL (PF) 1 % IJ SOLN: 5.0000 mL | INTRAMUSCULAR | Status: DC | PRN

## 2021-01-13 MED ORDER — LOPERAMIDE HCL 1 MG/7.5ML PO SUSP
1.0000 mg | Freq: Two times a day (BID) | ORAL | Status: DC
  Administered 2021-01-13 – 2021-01-19 (×12): 1 mg
  Filled 2021-01-13 (×15): qty 7.5

## 2021-01-13 MED ORDER — LIDOCAINE-PRILOCAINE 2.5-2.5 % EX CREA: 1.0000 "application " | TOPICAL_CREAM | CUTANEOUS | Status: DC | PRN

## 2021-01-13 MED ORDER — SODIUM CHLORIDE 0.9 % IV SOLN: 100.0000 mL | INTRAVENOUS | Status: DC | PRN

## 2021-01-13 MED ORDER — HEPARIN SODIUM (PORCINE) 1000 UNIT/ML DIALYSIS: 1000.0000 [IU] | INTRAMUSCULAR | Status: DC | PRN

## 2021-01-13 MED ORDER — ALTEPLASE 2 MG IJ SOLR: 2.0000 mg | Freq: Once | INTRAMUSCULAR | Status: DC | PRN

## 2021-01-13 MED ORDER — PENTAFLUOROPROP-TETRAFLUOROETH EX AERO: 1.0000 "application " | INHALATION_SPRAY | CUTANEOUS | Status: DC | PRN

## 2021-01-13 NOTE — Progress Notes (Signed)
[facility] PROGRESS NOTE  Thomas [name] EQA:[id number] DOB: Sep 20, 1955 DOA: 11/30/2020 PCP: [name], NP  Status: Remains inpatient appropriate because:Persistent severe electrolyte disturbances, Unsafe d/c plan, and Inpatient level of care appropriate due to severity of illness  Dispo: The patient is from: Home              Anticipated d/c is to: SNF versus LTAC              Patient currently is not medically stable to d/c.   Difficult to place patient Yes               Barriers to discharge: Currently has tracheostomy in place and is requiring dialysis but remains too weak to tolerate dialysis in chair which is a requirement for outpatient HD    Level of care: Progressive  Code Status: Full Family Communication: Patient only DVT prophylaxis: Eliquis COVID vaccination status: Moderna 07/08/2019 and 08/11/2019 with first booster dose given 08/05/2020   HPI:  65 y.o. male with a history of colon cancer s/p resection and colostomy May 2022 s/p 3 cycles chemotherapy (last was July 2022), T2DM with neuropathy, HTN, HLD who presented to the ED 7/24 with abdominal pain found to be febrile, tachycardic, tachypneic. Lactic acid was 6.6 and persisted at 6.8 after sepsis IV fluid bolus and cefepime. He had ARF, demand ischemia, bandemia, was hyperglycemic with negative ketones, with lactic acidosis. He developed AFib with RVR in the ED, was started on diltiazem, and was admitted to the ICU for septic shock with unclear source. Sepsis was subsequently felt to be ruled out at time of admission, presentation felt more likely to be related to cepacitabine toxicity with oral mucositis, hand/foot syndrome, and diarrhea/high output colostomy. He was transferred to hospitalist service 7/27 but developed metabolic encephalopathy with hyoxia due to aspiration pneumonia on 7/30 requiring intubation and pressor support. Failed extubation with inability to clear secretions, requiring reintubation 8/8, and  tracheostomy subsequently placed 8/11. This was pulled out 8/22, replaced under bronch guidance, again removed 8/25 requiring reintubation after failed reinsertion. ENT revised tracheostomy in the OR 8/29. He has required IHD for ARF, and also had complications of RLE DVT now on eliquis, colitis thought to be ischemic 8/2 not requiring specific intervention, Pseudomonas pneumonia having completed treatment, agitated encephalopathy which has improved, anemia requiring 4u PRBCs total, and thrombocytopenia which has resolved. On 9/4 he was transferred to hospitalist service having tolerated trach collar.  Subjective: Awake and alert.  Able to convey that he is not having any acute problems.  Plans on returning home with his wife.  Agreeable to LTAC placement to facilitate rehab if a candidate.  He is also hopeful that his dialysis will be temporary.  Objective: Vitals:   01/13/21 0700 01/13/21 0731  BP: (!) 151/65 (!) 153/65  Pulse: (!) 101 94  Resp: 18   Temp: 98 F (36.7 C)   SpO2: 99% 100%    Intake/Output Summary (Last 24 hours) at 01/13/2021 0734 Last data filed at 01/13/2021 0500 Gross per 24 hour  Intake 643 ml  Output 1200 ml  Net -557 ml   Filed Weights   01/11/21 0500 01/12/21 0500 01/13/21 0416  Weight: 100.4 kg 107.2 kg 107.2 kg    Exam:  Constitutional: NAD, calm, comfortable Respiratory: #6 XLT cuffed trach to 28% trach collar, coarse to auscultation with strong cough effort/wet sounding cough.  No increased work of breathing Cardiovascular: Regular rate and rhythm, no murmurs / rubs / gallops.  No extremity edema. 2+ pedal pulses.  Abdomen: no tenderness, no masses palpated. Bowel sounds positive. LBM 9/6; colostomy with liquid stool averaging about 6 to 800 cc per 24-hour; cortrack for tube feedings and medication Neurologic: CN 2-12 grossly intact. Sensation intact, Strength 3/5 x all 4 extremities.  Psychiatric: Appears to have normal judgment and insight. Alert and  oriented x 3. Normal mood.    Assessment/Plan: Acute problems:  Acute hypoxic respiratory failure s/p tracheostomy due to aspiration pneumonia  Pseudomonas pneumonia  -Leukocytosis remains low grade without fever or worsening oxygenation consistent with either stress/reactive leukocytosis or persistent microaspiration.  - Continue trach collar following trach revision by ENT 8/29. PCCM following intermittently for management.  -Continues with significant tracheal secretions-currently has cuffed trach but at a smaller size.  SLP working with patient re: PMV trials and has tolerated 35 minutes as of 9/5 -Hopeful can eventually decannulate-very alert and motivated and has strong spontaneous cough effort - Last CXR 9/1 reviewed, left base opacity improved.   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   - Continue HD TTS thru L IJ TDC per nephrology.  Patient needs to tolerate sitting up in chair for dialysis treatments -For now we will begin out of bed to chair 3 times daily-my interaction with patient he appears very motivated -Once tolerating out of bed to chair regularly can begin inpatient HD in chair and see how he tolerates -Although he is making urine it appears they do not suspect full renal recovery-was also demonstrating renal injury from chemotherapy prior to this admission   High output ostomy, diarrhea:  -Negative C. diff PCR (7/26) and GI pathogen panel (8/1). Possibly ischemic colitis on CT 8/2-need to maintain blood pressure during dialysis treatment -No bloody output per ostomy -Change to scheduled Imodium and continue fiber per tube -Discontinue all as needed laxatives and stool softener; discontinuing PPI - Monitor output to maintain euvolemia   Anemia of critical illness, acute blood loss as well as anemia of chronic disease and iron deficiency:  - s/p 4u PRBCs during this hospitalization  - Ok to  continue anticoagulation for DVT at this time.  - Getting IV iron TTS x8 doses per nephrology. -Current hemoglobin stable at 8.6 with normal platelet   Stage IIIb colon CA s/p partial transverse colectomy with end colostomy May 2022:  -Followed by Dr. Benay Spice. Started CAPOX June 2022, reduced cepecitabine dose starting 11/19/2020 due to hand/foot syndrome, reduced oxaliplatin due to renal impairment and poor performance status.  - Dr. Benay Spice has followed during this hospitalization. Per note 8/2 "[pt] is undergoing curative therapy for colon cancer..." - Palliative care has been involved during this hospitalization, though goals remain clear: full aggressive measures desired.    Moderate protein calorie malnutrition/dysphagia Nutrition Problem: Moderate Malnutrition Etiology: chronic illness, cancer and cancer related treatments Signs/Symptoms: mild fat depletion, mild muscle depletion, percent weight loss Percent weight loss: 11 % Interventions: Prostat, Tube feeding, MVI  Body mass index is 35.47 kg/m.  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 Dx 8/5.  Did not have significant edema  therefore did not require thrombolysis - Continue eliquis   New onset atrial fibrillation with RVR:  -Currently maintaining sinus rhythm - No rate or rhythm control agents required at this time. - Continue eliquis as above   Seizure disorder:  - Continue keppra   Hand/foot syndrome related to xeloda:  -Dose reduced in the outpatient setting and given acuity of current symptoms this medication has not been utilized during hospitalization - No infection noted at this time. Will continue moisturizing topical Tx   Acute metabolic encephalopathy, agitated delirium:  -On 9/6 appeared quite alert and oriented.  Was able to recall that he had received his COVID booster.  He reached for his wallet and pulled out his COVID immunization card - Continue clonazepam 0.21m per tube BID and prn - Continue quetiapine 545mBID   Uncontrolled T2DM on OHAs prior to admission with associated neuropathy:  HbA1c 8.7% at admission. - CBGs well-controlled currently, continue levemir 15u BID and SSI q4h.  - Continue gabapentin 10057m8h.   HTN:  - Continue norvasc   HLD:  - Continue atorvastatin   Thrombocytopenia:  -Transient likely due to critical illness. HIT Ab negative on 8/5.    Stress ulcer prophylaxis -Continue PPI but consider discontinuing since this medication can contribute to loose stools and diarrhea   ?Sepsis - initially thought to be present on admission- has been ruled out.     Data Reviewed: Basic Metabolic Panel: Recent Labs  Lab 01/06/21 1222 01/07/21 0352 01/09/21 0350 01/10/21 0330 01/11/21 0356 01/12/21 0157 01/13/21 0149  NA  --    < > 136 136 136 136 136  K  --    < > 2.9* 3.9 3.8 4.1 4.4  CL  --    < > 103 100 99 96* 99  CO2  --    < > 22 23 24 24 22   GLUCOSE  --    < > 128* 161* 166* 163* 167*  BUN  --    < > 24* 47* 28* 46* 66*  CREATININE  --    < > 2.41* 3.73* 2.71* 3.54* 3.81*  CALCIUM  --    < > 7.8* 9.3 8.9 9.5 9.3  MG 1.9  --  1.8 1.9  --   --   --    PHOS 8.4*   < > 3.5  3.9 5.3* 2.7 5.0* 6.2*   < > = values in this interval not displayed.   Liver Function Tests: Recent Labs  Lab 01/09/21 0350 01/10/21 0330 01/11/21 0356 01/12/21 0157 01/13/21 0149  ALBUMIN 1.8* 2.2* 2.3* 2.3* 2.3*   No results for input(s): LIPASE, AMYLASE in the last 168 hours. No results for input(s): AMMONIA in the last 168 hours. CBC: Recent Labs  Lab 01/07/21 0352 01/09/21 0350 01/10/21 0330 01/11/21 0356 01/13/21 0149  WBC 10.8* 12.0* 12.4* 13.3* 13.5*  NEUTROABS  --   --  8.0*  --   --   HGB 8.3* 8.2* 8.2* 8.6* 8.3*  HCT 24.5* 25.2* 25.7* 25.8* 25.9*  MCV 86.0 87.5 87.7 86.9 88.4  PLT 332 342 381 326 369   Cardiac Enzymes: No results for input(s): CKTOTAL, CKMB, CKMBINDEX, TROPONINI in the last 168 hours. BNP (last 3 results) Recent Labs    12/06/20 2119  BNP 74.9    ProBNP (last 3 results) No results for input(s): PROBNP in the last 8760 hours.  CBG: Recent Labs  Lab 01/12/21 1125 01/12/21 1531 01/12/21 2023 01/13/21 0003 01/13/21 0415  GLUCAP 135* 159* 130* 158* 146*    No results found for this or any previous visit (from the past 240 hour(s)).   Studies: No results found.  Scheduled Meds:  acetaminophen  1,000 mg Per Tube Q8H   amLODipine  5 mg Per Tube Daily   apixaban  5 mg Per Tube BID   vitamin C  500 mg Per Tube TID   atorvastatin  20 mg Per Tube Daily   chlorhexidine gluconate (MEDLINE KIT)  15 mL Mouth Rinse BID   Chlorhexidine Gluconate Cloth  6 each Topical Daily   Chlorhexidine Gluconate Cloth  6 each Topical Q0600   clonazepam  0.5 mg Per Tube BID   feeding supplement (PROSource TF)  45 mL Per Tube BID   fiber  1 packet Per Tube BID   gabapentin  100 mg Per Tube Q8H   hydrocerin   Topical BID   insulin aspart  0-15 Units Subcutaneous Q4H   insulin detemir  15 Units Subcutaneous BID   levETIRAcetam  500 mg Per Tube BID   mouth rinse  15 mL Mouth Rinse 10 times per day   multivitamin  1 tablet Per  Tube QHS   pantoprazole sodium  40 mg Per Tube BID   QUEtiapine  50 mg Per Tube BID   thiamine  100 mg Per Tube Daily   Continuous Infusions:  sodium chloride Stopped (12/20/20 0955)   sodium chloride     sodium chloride     anticoagulant sodium citrate     feeding supplement (JEVITY 1.5 CAL/FIBER) 1,000 mL (01/13/21 0724)   ferric gluconate (FERRLECIT) IVPB 125 mg (01/10/21 1412)    Principal Problem:   Diabetes mellitus with hyperosmolarity without hyperglycemic hyperosmolar nonketotic coma (HCC) Active Problems:   Lactic acidosis   Acute renal failure superimposed on stage 2 chronic kidney disease (HCC)   Metabolic acidosis   Tachycardia   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 (HCC)   Adverse effect of chemotherapy   Pressure injury of skin   Atrial fibrillation with RVR (HCC)   Sepsis ([place])   Malnutrition of moderate degree   Acute metabolic encephalopathy   Acute respiratory failure with hypoxemia (HCC)   Acute renal failure with acute cortical necrosis ([place])   Shock ([place])   Status post tracheostomy [facility])   Consultants: PCCM General surgery Nephrology Hematology/oncology Palliative care medicine team  Procedures: 2u PRBC 7/31, 1u PRBC 8/17, 1u PRBC 8/27 Tracheostomy revision 01/05/2021 Dr. Constance Holster    Antibiotics: Vancomycin 7/24 Cefepime 7/24 - 7/27 Unasyn 7/30 >> cefepime/flagyl 7/31 - 8/6    Time spent: 40 minutes    [name] ANP  [facility] 7 am - 330 pm/M-F for direct patient care and secure chat Please refer to Amion for contact info 43  days

## 2021-01-13 NOTE — Progress Notes (Signed)
NAME:  HERALD VALLIN, MRN:  [id number], DOB:  03/25/1956, LOS: 7 ADMISSION DATE:  11/30/2020, CONSULTATION DATE:  7/30 REFERRING MD:  Riccardo Dubin, CHIEF COMPLAINT:  Worsening hypoxia/dyspnea  History of Present Illness:  65 yo M admitted 7/24 for worsening sepsis vs reaction to chemotherapy with high ostomy output and soft BP on 7/25. Has had issues during his hospitalization with recurrent aspirations and more recently worsening respiratory status.  Most recently pt developed worsening hypoxia and was requiring BIPAP w/ FIO2 of 100% and increased work of breathing.   He was admitted to intensive care and subsequently had worsening clinical status requiring intubation, CRRT, multiple pressors and pneumonia along with CT findings 8/1 concerning for ischemic bowel.  Significant Hospital Events:  7/27 Transferred to [facility], stopped abx as sepsis resolved/ruled out 7/30 AM Worsened AME, more hypoxic with worsening renal funciton, new aspiration on abx restarted 7/30 PM Pt with increased o2 requirement on 100% FIO2 on BIPAP, d/w family who wanted to proceed with intubation, pt intubated 7/31 Tracheal aspirate grew moderate candida tropicalis  8/1 Some issues with tachycardia and hypotension overnight. Reproted high ostomy output.  GI PCR negative 8/2 down to 9 mcg of Levophed, however repeat CT abdomen/pelvis showed inflammatory wall thickening and fat stranding of the descending and sigmoid colon, new since prior, consistent with nonspecific infectious, inflammatory, or ischemic colitis.  Also new dense consolidative airspace opacity in the right lower and right middle lobes 8/3 further weaning NE. Weaning sedation. Full code, full scope per family meeting 8/2. Eraxis stopped (8/1-8/3) 8/4 off pressors, plt down to 103.  8/5 remains off pressors. Moving around more, not really following commands though  8/9 failed extubation 8/8, tolerated nasal cannula for about an hour, desaturations and inability to  clear secretions-reintubated 8/8 8/10 some agitation overnight 8/11 tracheostomy placement On trach collar since 8/16 8/22 pulled trach out, replaced under bronch guidance. Has proximal area of granulation tissue that is above the bottom of the trach tube.  8/25 Removed Trach, unable to reinsert, despite multiple attempts re-intubated 8/29 Anemia, 1 unit PRBC. ENT re-placed Trach in [place].  Interim History / Subjective:  He denies complaints. No longer having left sided abdominal pain. Writing to help answer questions.  Objective   Blood pressure (!) 147/74, pulse (!) 118, temperature 98.2 F (36.8 C), temperature source Axillary, resp. rate (!) 30, height 5\' 8"  (1.727 m), weight 103.3 kg, SpO2 99 %.    FiO2 (%):  [28 %] 28 %   Intake/Output Summary (Last 24 hours) at 01/13/2021 1424 Last data filed at 01/13/2021 1201 Gross per 24 hour  Intake 643 ml  Output 3700 ml  Net -3057 ml    Filed Weights   01/13/21 0416 01/13/21 0825 01/13/21 1230  Weight: 107.2 kg 105.8 kg 103.3 kg     Exam:  General: Chronically ill appearing man lying in bed in NAD HEENT: [place]/AT, eyes anicteric. Neck: Trach in place Neuro: Awake, alert, not agitated, cooperative with exam. Globally weak but moving all extremities. CV: s1S2, irreg rhythm, reg rate PULM: breathing comfortably on TC, CTA. GI: soft, NT Extremities: no clubbing or cyanosis Skin: warm, dry, scaling improving  Resolved Hospital Problem list   Shock  HHS Afib RVR  Lactic Acidosis  Thrombocytopenia Colitis  Assessment & Plan:  Acute hypoxemic respiratory failure s/p tracheostomy Endotracheal granulation tissue Aspiration pneumonia Obstructive sleep apnea Pseudomonas and tracheal aspirate-fully treated -Trach revision by ENT on 8/29 -Continues to tolerate trach collar. Con't routine trach  care.  Acute kidney injury on chronic kidney disease stage IIIb Hyponatremia -On intermittent hemodialysis Tuesday, Thursday, and  Saturday -Continue to monitor electrolytes  Colorectal cancer, completed chemotherapy July 2022  Lower extremity DVT -Continue Eliquis  PCCM will continue to follow twice weekly. Please call in the interim with questions.  Julian Hy, DO 01/13/21 2:26 PM [facility]

## 2021-01-13 NOTE — TOC Benefit Eligibility Note (Signed)
Patient Teacher, English as a foreign language completed.    The patient is currently admitted and upon discharge could be taking Eliquis 5 mg.  The current 30 day co-pay is, $9.85.   The patient is insured through [employer] [place] Patient Advocate Specialist [place] Team Direct Number: [phone]  Fax: [phone]

## 2021-01-13 NOTE — Progress Notes (Signed)
Pt has been coughing incessantly throughout the night and has had copious amounts of secretions which required suctioning consistently throughout the shift. Pt now complains of abdominal pain due to coughing. He was able to maintain his O2 sats during suctioning on room air. Will notify provider.  01/13/21 0500  Vitals  BP (!) 157/77  MAP (mmHg) 106  Pulse Rate 96  ECG Heart Rate 97  MEWS COLOR  MEWS Score Color Green  Oxygen Therapy  SpO2 100 %  MEWS Score  MEWS Temp 0  MEWS Systolic 0  MEWS Pulse 0  MEWS RR 0  MEWS LOC 0  MEWS Score 0

## 2021-01-13 NOTE — Plan of Care (Signed)

## 2021-01-13 NOTE — TOC Progression Note (Addendum)
Transition of Care [facility]) - Progression Note    Patient Details  Name: Thomas [name] MRN: [id number] Date of Birth: [birth year]  Transition of Care [facility]) CM/SW [place], RN Phone Number: 01/13/2021, 12:27 PM  Clinical Narrative:    CM met with patient at the bedside to discuss transitions of care.  I spoke with the patient regarding possible admission to [facility] facility if appropriate.  The patient currently has #6 cuffed trach, present HD need and Cortrak intact at this time.  CM called and spoke with Anderson Malta. CM with [facility] and I asked that she review the patient's clinicals for possible admission for LTAC at the facility.  01/13/2021 1400 - [facility] is unable to consider the patient for admission at this time since he is unable to sit for HD. [name], NP is aware and orders were placed to progress the patient and have him up and OOB as tolerated.  CM called and spoke with Raquel Sarna, [place] with [facility] to have the facility review the patient for possible admission to the facility.   Expected Discharge Plan: Home/Self Care Barriers to Discharge: Continued Medical Work up  Expected Discharge Plan and Services Expected Discharge Plan: Home/Self Care   Discharge Planning Services: CM Consult   Living arrangements for the past 2 months: Single Family Home                                       Social Determinants of Health (SDOH) Interventions    Readmission Risk Interventions No flowsheet data found.

## 2021-01-13 NOTE — Progress Notes (Signed)
[facility] NEPHROLOGY PROGRESS NOTE  Assessment/ Plan:  #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 CRRT initiated 7/31 and transitioned to IHD on 12/19/20.  S/p [facility] placement 12/22/20.  Continue with TTS schedule for now.  Plan for HD today.    # Volume overload/anasarca-improved with dialysis.  # Sepsis/aspiration pneumonia/VDRF:aspiration pneumonia.  Completed antibiotics course.  S/p tracheostomy, however pt removed it on 01/01/21 s/p intubation.  New tracheostomy created 01/05/21 by ENT.   # Stage IIIB colon cancer - sp partial colectomy, colostomy. On chemoRx sp 3 cycles.  # Right leg DVT: On Eliquis.  # Atrial fibrillation - on amiodarone and anticoagulation.    # Anemia of critical illness: Receiving IV iron with HD, transfuse as needed, holding erythropoietin with active malignancy.  #Disposition - poor overall prognosis.  He is not suitable for outpatient HD, doubt he will be candidate for LTC facility.  Palliative care consult (initially seen 12/08/20).  Full code.  Subjective: Seen and examined.  No new event.  He is alert awake.  Objective Vital signs in last 24 hours: Vitals:   01/13/21 0500 01/13/21 0600 01/13/21 0700 01/13/21 0731  BP: (!) 157/77 (!) 91/55 (!) 151/65 (!) 153/65  Pulse: 96 96 (!) 101 94  Resp:  16 18   Temp:   98 F (36.7 C)   TempSrc:   Axillary   SpO2: 100% 99% 99% 100%  Weight:      Height:       Weight change: 0 kg  Intake/Output Summary (Last 24 hours) at 01/13/2021 0808 Last data filed at 01/13/2021 0500 Gross per 24 hour  Intake 643 ml  Output 1200 ml  Net -557 ml        Labs: Basic Metabolic Panel: Recent Labs  Lab 01/11/21 0356 01/12/21 0157 01/13/21 0149  NA 136 136 136  K 3.8 4.1 4.4  CL 99 96* 99  CO2 24 24 22   GLUCOSE 166* 163* 167*  BUN 28* 46* 66*  CREATININE 2.71* 3.54* 3.81*  CALCIUM 8.9 9.5 9.3  PHOS 2.7 5.0* 6.2*    Liver  Function Tests: Recent Labs  Lab 01/11/21 0356 01/12/21 0157 01/13/21 0149  ALBUMIN 2.3* 2.3* 2.3*    No results for input(s): LIPASE, AMYLASE in the last 168 hours. No results for input(s): AMMONIA in the last 168 hours. CBC: Recent Labs  Lab 01/09/21 0350 01/10/21 0330 01/11/21 0356 01/13/21 0149 01/13/21 0648  WBC 12.0* 12.4* 13.3* 13.5* 12.8*  NEUTROABS  --  8.0*  --   --   --   HGB 8.2* 8.2* 8.6* 8.3* 8.6*  HCT 25.2* 25.7* 25.8* 25.9* 26.8*  MCV 87.5 87.7 86.9 88.4 87.9  PLT 342 381 326 369 385    Cardiac Enzymes: No results for input(s): CKTOTAL, CKMB, CKMBINDEX, TROPONINI in the last 168 hours. CBG: Recent Labs  Lab 01/12/21 1531 01/12/21 2023 01/13/21 0003 01/13/21 0415 01/13/21 0759  GLUCAP 159* 130* 158* 146* 138*     Iron Studies: No results for input(s): IRON, TIBC, TRANSFERRIN, FERRITIN in the last 72 hours. Studies/Results: No results found.  Medications: Infusions:  sodium chloride Stopped (12/20/20 0955)   sodium chloride     sodium chloride     anticoagulant sodium citrate     feeding supplement (JEVITY 1.5 CAL/FIBER) 1,000 mL (01/13/21 0724)   ferric gluconate (FERRLECIT) IVPB 125 mg (01/10/21 1412)    Scheduled Medications:  acetaminophen  1,000  mg Per Tube Q8H   amLODipine  5 mg Per Tube Daily   apixaban  5 mg Per Tube BID   vitamin C  500 mg Per Tube TID   atorvastatin  20 mg Per Tube Daily   chlorhexidine gluconate (MEDLINE KIT)  15 mL Mouth Rinse BID   Chlorhexidine Gluconate Cloth  6 each Topical Daily   Chlorhexidine Gluconate Cloth  6 each Topical Q0600   clonazepam  0.5 mg Per Tube BID   feeding supplement (PROSource TF)  45 mL Per Tube BID   fiber  1 packet Per Tube BID   gabapentin  100 mg Per Tube Q8H   hydrocerin   Topical BID   insulin aspart  0-15 Units Subcutaneous Q4H   insulin detemir  15 Units Subcutaneous BID   levETIRAcetam  500 mg Per Tube BID   mouth rinse  15 mL Mouth Rinse 10 times per day    multivitamin  1 tablet Per Tube QHS   pantoprazole sodium  40 mg Per Tube BID   QUEtiapine  50 mg Per Tube BID   thiamine  100 mg Per Tube Daily    have reviewed scheduled and prn medications.  Physical Exam: General: Ill-looking male, alert, awake, tracheostomy  Heart:RRR, s1s2 nl Lungs: Coarse breath sounds anteriorly Abdomen:soft, Non-tender, non-distended Extremities: No edema.. Dialysis Access: LIJ TDC in place.   Thomas [name] Reesa Chew Marrisa Kimber 01/13/2021,8:08 AM  LOS: 43 days

## 2021-01-13 NOTE — Plan of Care (Signed)
  Problem: Education: Goal: Knowledge of General Education information will improve Description: Including pain rating scale, medication(s)/side effects and non-pharmacologic comfort measures Outcome: Progressing   Problem: Pain Managment: Goal: General experience of comfort will improve Outcome: Progressing   Problem: Safety: Goal: Ability to remain free from injury will improve Outcome: Progressing   

## 2021-01-14 DIAGNOSIS — E1100 Type 2 diabetes mellitus with hyperosmolarity without nonketotic hyperglycemic-hyperosmolar coma (NKHHC): Secondary | ICD-10-CM | POA: Diagnosis not present

## 2021-01-14 LAB — CBC WITH DIFFERENTIAL/PLATELET
Abs Immature Granulocytes: 0.17 10*3/uL — ABNORMAL HIGH (ref 0.00–0.07)
Basophils Absolute: 0.1 10*3/uL (ref 0.0–0.1)
Basophils Relative: 1 %
Eosinophils Absolute: 0.4 10*3/uL (ref 0.0–0.5)
Eosinophils Relative: 3 %
HCT: 30.2 % — ABNORMAL LOW (ref 39.0–52.0)
Hemoglobin: 10.1 g/dL — ABNORMAL LOW (ref 13.0–17.0)
Immature Granulocytes: 1 %
Lymphocytes Relative: 14 %
Lymphs Abs: 2 10*3/uL (ref 0.7–4.0)
MCH: 29.2 pg (ref 26.0–34.0)
MCHC: 33.4 g/dL (ref 30.0–36.0)
MCV: 87.3 fL (ref 80.0–100.0)
Monocytes Absolute: 1.9 10*3/uL — ABNORMAL HIGH (ref 0.1–1.0)
Monocytes Relative: 13 %
Neutro Abs: 9.6 10*3/uL — ABNORMAL HIGH (ref 1.7–7.7)
Neutrophils Relative %: 68 %
Platelets: 315 10*3/uL (ref 150–400)
RBC: 3.46 MIL/uL — ABNORMAL LOW (ref 4.22–5.81)
RDW: 16.5 % — ABNORMAL HIGH (ref 11.5–15.5)
WBC: 14.2 10*3/uL — ABNORMAL HIGH (ref 4.0–10.5)
nRBC: 0.2 % (ref 0.0–0.2)

## 2021-01-14 LAB — RENAL FUNCTION PANEL
Albumin: 2.8 g/dL — ABNORMAL LOW (ref 3.5–5.0)
Anion gap: 16 — ABNORMAL HIGH (ref 5–15)
BUN: 38 mg/dL — ABNORMAL HIGH (ref 8–23)
CO2: 23 mmol/L (ref 22–32)
Calcium: 9.5 mg/dL (ref 8.9–10.3)
Chloride: 96 mmol/L — ABNORMAL LOW (ref 98–111)
Creatinine, Ser: 2.88 mg/dL — ABNORMAL HIGH (ref 0.61–1.24)
GFR, Estimated: 24 mL/min — ABNORMAL LOW (ref >60)
Glucose, Bld: 168 mg/dL — ABNORMAL HIGH (ref 70–99)
Phosphorus: 5.1 mg/dL — ABNORMAL HIGH (ref 2.5–4.6)
Potassium: 4.4 mmol/L (ref 3.5–5.1)
Sodium: 135 mmol/L (ref 135–145)

## 2021-01-14 LAB — PROCALCITONIN: Procalcitonin: 1.14 ng/mL

## 2021-01-14 LAB — GLUCOSE, CAPILLARY
Glucose-Capillary: 141 mg/dL — ABNORMAL HIGH (ref 70–99)
Glucose-Capillary: 144 mg/dL — ABNORMAL HIGH (ref 70–99)
Glucose-Capillary: 167 mg/dL — ABNORMAL HIGH (ref 70–99)
Glucose-Capillary: 170 mg/dL — ABNORMAL HIGH (ref 70–99)
Glucose-Capillary: 173 mg/dL — ABNORMAL HIGH (ref 70–99)
Glucose-Capillary: 188 mg/dL — ABNORMAL HIGH (ref 70–99)

## 2021-01-14 LAB — URINALYSIS, ROUTINE W REFLEX MICROSCOPIC
Bilirubin Urine: NEGATIVE
Glucose, UA: NEGATIVE mg/dL
Ketones, ur: NEGATIVE mg/dL
Leukocytes,Ua: NEGATIVE
Nitrite: NEGATIVE
Protein, ur: 100 mg/dL — AB
Specific Gravity, Urine: 1.02 (ref 1.005–1.030)
pH: 5 (ref 5.0–8.0)

## 2021-01-14 LAB — URINALYSIS, MICROSCOPIC (REFLEX)

## 2021-01-14 LAB — C-REACTIVE PROTEIN: CRP: 1.7 mg/dL — ABNORMAL HIGH (ref <1.0)

## 2021-01-14 LAB — PHOSPHORUS: Phosphorus: 5.8 mg/dL — ABNORMAL HIGH (ref 2.5–4.6)

## 2021-01-14 LAB — MAGNESIUM: Magnesium: 1.9 mg/dL (ref 1.7–2.4)

## 2021-01-14 MED ORDER — CHLORHEXIDINE GLUCONATE CLOTH 2 % EX PADS
6.0000 | MEDICATED_PAD | Freq: Every day | CUTANEOUS | Status: DC
  Administered 2021-01-14 – 2021-01-27 (×8): 6 via TOPICAL

## 2021-01-14 MED ORDER — SODIUM CHLORIDE 0.9 % IV SOLN
1.0000 g | INTRAVENOUS | Status: DC
  Administered 2021-01-14: 1 g via INTRAVENOUS
  Filled 2021-01-14: qty 10

## 2021-01-14 NOTE — Progress Notes (Signed)
ENT follow-up.  He is off the ventilator.  Tracheostomy will be placed and well-seated.  I will change to uncuffed tracheostomy tube either later today or tomorrow morning in order to facilitate Passy-Muir valve use.

## 2021-01-14 NOTE — Progress Notes (Signed)
Occupational Therapy Treatment Patient Details Name: Thomas [name] MRN: [id number] DOB: November 13, 1955 Today's Date: 01/14/2021    History of present illness 65 y.o. male present to ED 7/24 with high ostomy output. Patient admitted with severe sepsis and multisystem organ failure. Hospital admission complicated by volume overload, aspiration pneumonia, acute respiratory failure, AKI and ischemic colitis. Intubated 7/30. S/p trach placement 8/11. CorTrak 8/12. HD initiated 8/12. To IR 8/15 for tunneled HD cath. Pulled out trach x2 8/22 replaced by PCCM. Pulled out trach again 8/25 but unable to be reinserted with pt intubated. 8/29 to OR for stoma clean up and trach replacement.  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   OT comments  Pt making improvement and progress towards OT goals today. Communicating well with mouthing and via notepad/pen. Pt with noted improvement in bed mobility, able to come EOB with mod A (nearing min A) +2 for line management. Once EOB able to perform at least 4 sit<>stand with mod A +2 in STEDY. Pt with anxiety/reaction to pain in standing in L leg and abdomen/stomach. If Pt can continue to make improvements - will want to consider CIR level therapy. POC remains appropriate at this time and OT will continue to follow acutely.    Follow Up Recommendations  SNF;Supervision/Assistance - 24 hour    Equipment Recommendations  Other (comment) (defer to next venue)    Recommendations for Other Services      Precautions / Restrictions Precautions Precautions: Fall Precaution Comments: cortrak, R subclavian tunneled HD cath, ostomy, O2 via trach collar       Mobility Bed Mobility Overal bed mobility: Needs Assistance Bed Mobility: Supine to Sit     Supine to  sit: Mod assist;+2 for safety/equipment     General bed mobility comments: pt able to walk legs out and weakly scoot hips toward EOB, but still needed moderate truncal assist up via R elbow until he could get his R hand to the bed.    Transfers Overall transfer level: Needs assistance   Transfers: Sit to/from Stand Sit to Stand: Mod assist (to min x2 within stedy, 4 standing trials.)         General transfer comment: cued for hand placement: asisted forward and with boost.  pt able to attain full upright stance today    Balance Overall balance assessment: Needs assistance   Sitting balance-Leahy Scale: Fair       Standing balance-Leahy Scale: Poor Standing balance comment: reliant on external assist                           ADL either performed or assessed with clinical judgement   ADL Overall ADL's : Needs assistance/impaired     Grooming: Wash/dry hands;Set up;Supervision/safety;Sitting Grooming Details (indicate cue type and reason): in recliner             Lower Body Dressing: Maximal assistance;Bed level Lower Body Dressing Details (indicate cue type and reason): Max A to don footwear in supine. Toilet Transfer: +2 for physical assistance;+2 for safety/equipment;Moderate assistance (STEDY)           Functional mobility during ADLs: Total assistance Charlaine Dalton) General ADL Comments: Focused session on increasing activity tolerance and challenging balance for longer periods of time. Performing ROM exericses supine and then multiple sit<>stands with  STEDY, after transfer to recliner via STEDY able to complete grooming tasks     Vision       Perception     Praxis      Cognition Arousal/Alertness: Awake/alert Behavior During Therapy: WFL for tasks assessed/performed Overall Cognitive Status: Within Functional Limits for tasks assessed (still a little difficult to fully discern)                                 General Comments:  pt follows commands with increased time and cues. Shakes head yes/no appropriately to questions. Writing to communicate now        Exercises     Shoulder Instructions       General Comments vss overall on TC at 28% FiO2.  Pt notable anxious.    Pertinent Vitals/ Pain       Pain Assessment: Faces Faces Pain Scale: Hurts little more Pain Location: stomach on the left Pain Descriptors / Indicators: Grimacing;Guarding;Discomfort Pain Intervention(s): Monitored during session;Repositioned;Other (comment) (pillows/padding)  Home Living                                          Prior Functioning/Environment              Frequency  Min 2X/week        Progress Toward Goals  OT Goals(current goals can now be found in the care plan section)  Progress towards OT goals: Progressing toward goals  Acute Rehab OT Goals Patient Stated Goal: to decrease pain OT Goal Formulation: With patient Time For Goal Achievement: 01/20/21 Potential to Achieve Goals: Good  Plan Discharge plan remains appropriate    Co-evaluation    PT/OT/SLP Co-Evaluation/Treatment: Yes Reason for Co-Treatment: For patient/therapist safety;To address functional/ADL transfers PT goals addressed during session: Mobility/safety with mobility;Balance;Strengthening/ROM OT goals addressed during session: ADL's and self-care;Strengthening/ROM      AM-PAC OT "6 Clicks" Daily Activity     Outcome Measure   Help from another person eating meals?: Total (NPO) Help from another person taking care of personal grooming?: A Lot Help from another person toileting, which includes using toliet, bedpan, or urinal?: A Lot Help from another person bathing (including washing, rinsing, drying)?: A Lot Help from another person to put on and taking off regular upper body clothing?: Total Help from another person to put on and taking off regular lower body clothing?: Total 6 Click Score: 9    End of  Session Equipment Utilized During Treatment: Oxygen (trach collar)  OT Visit Diagnosis: Unsteadiness on feet (R26.81);Muscle weakness (generalized) (M62.81);Pain Pain - Right/Left:  (stomach) Pain - part of body:  (stomach)   Activity Tolerance Patient tolerated treatment well (anxious in standing/with pain)   Patient Left in chair;with call bell/phone within reach;with chair alarm set   Nurse Communication Mobility status;Need for lift equipment        Time: 1735-6701 OT Time Calculation (min): 31 min  Charges: OT General Charges $OT Visit: 1 Visit OT Treatments $Therapeutic Activity: 8-22 mins  Jesse Sans OTR/L Acute Rehabilitation Services Pager: [phone] Office: [place] 01/14/2021, 1:01 PM

## 2021-01-14 NOTE — Progress Notes (Signed)
Physical Therapy Treatment Patient Details Name: Thomas [name] MRN: [id number] DOB: 07-10-1955 Today's Date: 01/14/2021    History of Present Illness 65 y.o. male present to ED 7/24 with high ostomy output. Patient admitted with severe sepsis and multisystem organ failure. Hospital admission complicated by volume overload, aspiration pneumonia, acute respiratory failure, AKI and ischemic colitis. Intubated 7/30. S/p trach placement 8/11. CorTrak 8/12. HD initiated 8/12. To IR 8/15 for tunneled HD cath. Pulled out trach x2 8/22 replaced by PCCM. Pulled out trach again 8/25 but unable to be reinserted with pt intubated. 8/29 to OR for stoma clean up and trach replacement.  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    PT Comments    Pt steadily starting to progress toward goals.  Limited by pain and anxiety, but now pt more able to work through it.  Emphasis on transition with intentional w/drawal of assist at points, sit to stand today with STEDY, but progression now to different standing techniques.  Pt now able to stand fully upright.  Continuing to stress getting OOB daily with pt and nursing to work on pt's tolerance and psyche.    Follow Up Recommendations  SNF;Other (comment) (3 x/ wk to see if it makes a difference in destination.)     Equipment Recommendations  Other (comment)    Recommendations for Other Services       Precautions / Restrictions Precautions Precautions: Fall Precaution Comments: cortrak, R subclavian tunneled HD cath, ostomy, O2 via trach collar    Mobility  Bed Mobility Overal bed mobility: Needs Assistance Bed Mobility: Supine to Sit     Supine to sit: Mod assist;+2 for safety/equipment     General bed mobility comments: pt able to walk legs out  and weakly scoot hips toward EOB, but still needed moderate truncal assist up via R elbow until he could get his R hand to the bed.    Transfers Overall transfer level: Needs assistance   Transfers: Sit to/from Stand Sit to Stand: Mod assist (to min x2 within stedy, 4 standing trials.)         General transfer comment: cued for hand placement: asisted forward and with boost.  pt able to attain full upright stance today  Ambulation/Gait                 Stairs             Wheelchair Mobility    Modified Rankin (Stroke Patients Only)       Balance Overall balance assessment: Needs assistance   Sitting balance-Leahy Scale: Fair       Standing balance-Leahy Scale: Poor Standing balance comment: reliant on external assist                            Cognition Arousal/Alertness: Awake/alert Behavior During Therapy: WFL for tasks assessed/performed Overall Cognitive Status: Within Functional Limits for tasks assessed (still a little difficult to fully discern)                                 General Comments: pt follows commands with increased time and cues. Shakes head yes/no appropriately to questions.      Exercises      General  Comments General comments (skin integrity, edema, etc.): vss overall on TC at 28% FiO2.  Pt notable anxious.      Pertinent Vitals/Pain Pain Assessment: Faces Faces Pain Scale: Hurts little more Pain Location: stomach on the left Pain Descriptors / Indicators: Grimacing;Guarding;Discomfort Pain Intervention(s): Monitored during session    Home Living                      Prior Function            PT Goals (current goals can now be found in the care plan section) Acute Rehab PT Goals Patient Stated Goal: to decrease pain PT Goal Formulation: With patient Time For Goal Achievement: 02/20/21 Potential to Achieve Goals: Good Progress towards PT goals: Progressing toward goals     Frequency    Min 2X/week      PT Plan Current plan remains appropriate    Co-evaluation PT/OT/SLP Co-Evaluation/Treatment: Yes Reason for Co-Treatment: For patient/therapist safety;Complexity of the patient's impairments (multi-system involvement) PT goals addressed during session: Mobility/safety with mobility        AM-PAC PT "6 Clicks" Mobility   Outcome Measure  Help needed turning from your back to your side while in a flat bed without using bedrails?: A Lot Help needed moving from lying on your back to sitting on the side of a flat bed without using bedrails?: Total Help needed moving to and from a bed to a chair (including a wheelchair)?: Total Help needed standing up from a chair using your arms (e.g., wheelchair or bedside chair)?: Total Help needed to walk in hospital room?: Total Help needed climbing 3-5 steps with a railing? : Total 6 Click Score: 7    End of Session         PT Visit Diagnosis: Muscle weakness (generalized) (M62.81);Other abnormalities of gait and mobility (R26.89)     Time: 6378-5885 PT Time Calculation (min) (ACUTE ONLY): 38 min  Charges:  $Therapeutic Exercise: 8-22 mins $Therapeutic Activity: 8-22 mins                     01/14/2021  Ginger Carne., PT Acute Rehabilitation Services [phone]  (pager) [phone]  (office)   Tessie Fass Zaine Elsass 01/14/2021, 12:41 PM

## 2021-01-14 NOTE — Progress Notes (Addendum)
[facility] PROGRESS NOTE  Thomas [name] AUQ:[id number] DOB: [birth year] DOA: 11/30/2020 PCP: [name], NP  Status: Remains inpatient appropriate because:Persistent severe electrolyte disturbances, Unsafe d/c plan, and Inpatient level of care appropriate due to severity of illness  Dispo: The patient is from: Home              Anticipated d/c is to: SNF versus LTAC              Patient currently is not medically stable to d/c.   Difficult to place patient Yes               Barriers to discharge: Currently has tracheostomy in place and is requiring dialysis but remains too weak to tolerate dialysis in chair which is a requirement for outpatient HD    Level of care: Progressive  Code Status: Full Family Communication: Patient only DVT prophylaxis: Eliquis COVID vaccination status: Moderna 07/08/2019 and 08/11/2019 with first booster dose given 08/05/2020   HPI:  65 y.o. male with a history of colon cancer s/p resection and colostomy May 2022 s/p 3 cycles chemotherapy (last was July 2022), T2DM with neuropathy, HTN, HLD who presented to the ED 7/24 with abdominal pain found to be febrile, tachycardic, tachypneic. Lactic acid was 6.6 and persisted at 6.8 after sepsis IV fluid bolus and cefepime. He had ARF, demand ischemia, bandemia, was hyperglycemic with negative ketones, with lactic acidosis. He developed AFib with RVR in the ED, was started on diltiazem, and was admitted to the ICU for septic shock with unclear source. Sepsis was subsequently felt to be ruled out at time of admission, presentation felt more likely to be related to cepacitabine toxicity with oral mucositis, hand/foot syndrome, and diarrhea/high output colostomy. He was transferred to hospitalist service 7/27 but developed metabolic encephalopathy with hyoxia due to aspiration pneumonia on 7/30 requiring intubation and pressor support. Failed extubation with inability to clear secretions, requiring reintubation 8/8, and  tracheostomy subsequently placed 8/11. This was pulled out 8/22, replaced under bronch guidance, again removed 8/25 requiring reintubation after failed reinsertion. ENT revised tracheostomy in the OR 8/29. He has required IHD for ARF, and also had complications of RLE DVT now on eliquis, colitis thought to be ischemic 8/2 not requiring specific intervention, Pseudomonas pneumonia having completed treatment, agitated encephalopathy which has improved, anemia requiring 4u PRBCs total, and thrombocytopenia which has resolved. On 9/4 he was transferred to hospitalist service having tolerated trach collar.  Subjective: Alert.  Denies any abdominal pain or dysuria.  Discussed need to improve activity with eventual discharge goal home.  So discussed SLP PMV training and if can tolerate for long periods of time plan is to trial oral intake so can avoid placement of gastrostomy tube.  Objective: Vitals:   01/14/21 0355 01/14/21 0600  BP: (!) 167/67 (!) 173/91  Pulse: (!) 105 (!) 102  Resp: (!) 21 20  Temp: (!) 97.3 F (36.3 C) 98 F (36.7 C)  SpO2: 98%     Intake/Output Summary (Last 24 hours) at 01/14/2021 0731 Last data filed at 01/14/2021 0700 Gross per 24 hour  Intake 780 ml  Output 2950 ml  Net -2170 ml   Filed Weights   01/13/21 0416 01/13/21 0825 01/13/21 1230  Weight: 107.2 kg 105.8 kg 103.3 kg    Exam:  Constitutional: NAD, calm, comfortable Respiratory: #6 XLT cuffed trach to 28% trach collar, clear to auscultation.  No increased work of breathing Cardiovascular: S1-S2, normotensive, regular pulse.  No peripheral edema.  Skin warm and dry with appropriate capillary refill. Abdomen: no tenderness with palpation, Bowel sounds positive. LBM 9/6; colostomy with liquid stool; cortrack for tube feedings and medication Neurologic: CN 2-12 grossly intact. Sensation intact, Strength 3/5 x all 4 extremities.  Psychiatric: Appears to have normal judgment and insight. Alert and oriented x 3.  Normal mood.    Assessment/Plan: Acute problems:  Acute hypoxic respiratory failure s/p tracheostomy due to aspiration pneumonia  Recent Pseudomonas pneumonia  -Leukocytosis remains low grade without fever or worsening oxygenation consistent with either stress/reactive leukocytosis or persistent microaspiration.  - Continue trach collar following trach revision by ENT 8/29. PCCM following intermittently for management.  -ENT plans to change to cuffless trach to facilitate PMV training -Given progressive leukocytosis as a precaution we will obtain tracheal aspirate culture; will check procalcitonin as well  Progressive leukocytosis/abnormal urinalysis -With urinary retention 450 cc this morning.  RN performed I/O catheterization with very creamy appearing urine obtained.  Subsequent urinalysis appears consistent with UTI.  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   - Continue HD TTS thru L IJ TDC per nephrology.  Patient needs to tolerate sitting up in chair for dialysis treatments -Continue OOB to chair TID -Although he is making urine it appears they do not suspect full renal recovery-was also demonstrating renal injury from chemotherapy prior to this admission   High output ostomy, diarrhea:  -Negative C. diff PCR (7/26) and GI pathogen panel (8/1). Possibly ischemic colitis on CT 8/2-need to maintain blood pressure during dialysis treatment -Continue scheduled Imodium and continue fiber per tube   Anemia of critical illness, acute blood loss as well as anemia of chronic disease and iron deficiency:  - s/p 4u PRBCs during this hospitalization  - Getting IV iron TTS x8 doses per nephrology. -Current hemoglobin stable at 8.6 with normal platelets   Stage IIIb colon CA s/p partial transverse  colectomy with end colostomy May 2022:  -Followed by Dr. Benay Spice. Started CAPOX June 2022, reduced cepecitabine dose starting 11/19/2020 due to hand/foot syndrome, reduced oxaliplatin due to renal impairment and poor performance status.  - Dr. Benay Spice has followed during this hospitalization. Per note 8/2 "[pt] is undergoing curative therapy for colon cancer..." - Palliative care has been involved during this hospitalization, though goals remain clear: full aggressive measures desired.    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 Dx 8/5.  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 Will continue moisturizing topical Tx   Acute  metabolic encephalopathy, agitated delirium:  -Resolved - Continue clonazepam 0.87m per tube BID and prn-taper and DC as tolerated - Continue quetiapine 582mBID-taper and DC as tolerated   Uncontrolled T2DM on OHAs prior to admission with associated neuropathy:  HbA1c 8.7% at admission. - CBGs well-controlled currently, continue levemir 15u BID and SSI q4h.  - Continue gabapentin 10041m8h.   HTN:  - Continue norvasc   HLD:  - Continue atorvastatin   Thrombocytopenia:  -Transient likely due to critical illness. HIT Ab negative on 8/5.    Stress ulcer prophylaxis -Continue PPI but consider discontinuing since this medication can contribute to loose stools and diarrhea   ?Sepsis - initially thought to be present on admission- has been ruled out.     Data Reviewed: Basic Metabolic Panel: Recent Labs  Lab 01/09/21 0350 01/10/21 0330 01/11/21 0356 01/12/21 0157 01/13/21 0149 01/14/21 0135  NA 136 136 136 136 136 135  K 2.9* 3.9 3.8 4.1 4.4 4.4  CL 103 100 99 96* 99 96*  CO2 22 23 24 24 22 23   GLUCOSE 128* 161* 166* 163* 167* 168*  BUN 24* 47* 28* 46* 66* 38*  CREATININE 2.41* 3.73* 2.71* 3.54* 3.81* 2.88*  CALCIUM 7.8* 9.3 8.9 9.5 9.3 9.5  MG 1.8 1.9  --   --   --   --   PHOS 3.5  3.9 5.3* 2.7 5.0* 6.2* 5.1*   Liver Function Tests: Recent Labs  Lab 01/10/21 0330 01/11/21 0356 01/12/21 0157 01/13/21 0149 01/14/21 0135  ALBUMIN 2.2* 2.3* 2.3* 2.3* 2.8*    CBC: Recent Labs  Lab 01/09/21 0350 01/10/21 0330 01/11/21 0356 01/13/21 0149 01/13/21 0648  WBC 12.0* 12.4* 13.3* 13.5* 12.8*  NEUTROABS  --  8.0*  --   --   --   HGB 8.2* 8.2* 8.6* 8.3* 8.6*  HCT 25.2* 25.7* 25.8* 25.9* 26.8*  MCV 87.5 87.7 86.9 88.4 87.9  PLT 342 381 326 369 385    CBG: Recent Labs  Lab 01/13/21 1334 01/13/21 1609 01/13/21 1939 01/13/21 2351 01/14/21 0358  GLUCAP 176* 176* 157* 181* 170*    Scheduled Meds:  acetaminophen  1,000 mg Per Tube Q8H   amLODipine  5 mg  Per Tube Daily   apixaban  5 mg Per Tube BID   vitamin C  500 mg Per Tube TID   atorvastatin  20 mg Per Tube Daily   chlorhexidine gluconate (MEDLINE KIT)  15 mL Mouth Rinse BID   Chlorhexidine Gluconate Cloth  6 each Topical Q0600   clonazepam  0.5 mg Per Tube BID   feeding supplement (PROSource TF)  45 mL Per Tube BID   fiber  1 packet Per Tube BID   gabapentin  100 mg Per Tube Q8H   hydrocerin   Topical BID   insulin aspart  0-15 Units Subcutaneous Q4H   insulin detemir  15 Units Subcutaneous BID   levETIRAcetam  500 mg Per Tube BID   loperamide HCl  1 mg Per Tube BID   mouth rinse  15 mL Mouth Rinse 10 times per day   multivitamin  1 tablet Per Tube QHS  pantoprazole sodium  40 mg Per Tube BID   QUEtiapine  50 mg Per Tube BID   thiamine  100 mg Per Tube Daily   Continuous Infusions:  sodium chloride Stopped (12/20/20 0955)   anticoagulant sodium citrate     feeding supplement (JEVITY 1.5 CAL/FIBER) 1,000 mL (01/14/21 0206)   ferric gluconate (FERRLECIT) IVPB Stopped (01/13/21 1350)    Principal Problem:   Diabetes mellitus with hyperosmolarity without hyperglycemic hyperosmolar nonketotic coma (HCC) Active Problems:   Lactic acidosis   Acute renal failure superimposed on stage 2 chronic kidney disease (HCC)   Metabolic acidosis   Tachycardia   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 (HCC)   Adverse effect of chemotherapy   Pressure injury of skin   Atrial fibrillation with RVR ([place])   Sepsis (HCC)   Malnutrition of moderate degree   Acute metabolic encephalopathy   Acute respiratory failure with hypoxemia (HCC)   Acute renal failure with acute cortical necrosis ([place])   Shock ([place])   Status post tracheostomy [facility])   Consultants: PCCM General surgery Nephrology Hematology/oncology Palliative care medicine team  Procedures: 2u PRBC 7/31, 1u PRBC 8/17, 1u PRBC 8/27 Tracheostomy revision 01/05/2021 Dr. Constance Holster    Antibiotics: Vancomycin  7/24 Cefepime 7/24 - 7/27 Unasyn 7/30 >> cefepime/flagyl 7/31 - 8/6    Time spent: 40 minutes    [name] ANP  [facility] 7 am - 330 pm/M-F for direct patient care and secure chat Please refer to Amion for contact info 44  days

## 2021-01-14 NOTE — Progress Notes (Signed)
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 CRRT initiated 7/31 and transitioned to IHD on 12/19/20.  S/p [facility] placement 12/22/20.  Continue with TTS schedule for now.  Status post HD yesterday with 2.5 L UF.  No sign of renal recovery, remains anuric.  Plan for next HD tomorrow    # Volume overload/anasarca-improved with dialysis.  # Sepsis/aspiration pneumonia/VDRF:aspiration pneumonia.  Completed antibiotics course.  S/p tracheostomy, however pt removed it on 01/01/21 s/p intubation.  New tracheostomy created 01/05/21 by ENT.   # Stage IIIB colon cancer - sp partial colectomy, colostomy. On chemoRx sp 3 cycles.  # Right leg DVT: On Eliquis.  # Atrial fibrillation - on amiodarone and anticoagulation.    # Anemia of critical illness: Receiving IV iron with HD, transfuse as needed, holding erythropoietin with active malignancy.  #Disposition - poor overall prognosis.  Seen by palliative care team.  Discussion ongoing for possible transfer to LTAC/[facility].  The tracheostomy should be capped before he can be accepted to [facility]  Subjective: Seen and examined.  No new event, tolerating dialysis well.  Some tracheal secretions. Objective Vital signs in last 24 hours: Vitals:   01/13/21 2309 01/14/21 0323 01/14/21 0355 01/14/21 0600  BP: (!) 160/77  (!) 167/67 (!) 173/91  Pulse: (!) 103 99 (!) 105 (!) 102  Resp: (!) 21 (!) 21 (!) 21 20  Temp: (!) 97.5 F (36.4 C)  (!) 97.3 F (36.3 C) 98 F (36.7 C)  TempSrc: Oral  Oral Axillary  SpO2: 98% 100% 98%   Weight:      Height:       Weight change: -1.4 kg  Intake/Output Summary (Last 24 hours) at 01/14/2021 0836 Last data filed at 01/14/2021 0700 Gross per 24 hour  Intake 780 ml  Output 2950 ml  Net -2170 ml        Labs: Basic Metabolic Panel: Recent Labs  Lab 01/12/21 0157 01/13/21 0149  01/14/21 0135  NA 136 136 135  K 4.1 4.4 4.4  CL 96* 99 96*  CO2 _0 GLUCOSE 163* 167* 168*  BUN 46* 66* 38*  CREATININE 3.54* 3.81* 2.88*  CALCIUM 9.5 9.3 9.5  PHOS 5.0* 6.2* 5.1*    Liver Function Tests: Recent Labs  Lab 01/12/21 0157 01/13/21 0149 01/14/21 0135  ALBUMIN 2.3* 2.3* 2.8*    No results for input(s): LIPASE, AMYLASE in the last 168 hours. No results for input(s): AMMONIA in the last 168 hours. CBC: Recent Labs  Lab 01/09/21 0350 01/10/21 0330 01/11/21 0356 01/13/21 0149 01/13/21 0648  WBC 12.0* 12.4* 13.3* 13.5* 12.8*  NEUTROABS  --  8.0*  --   --   --   HGB 8.2* 8.2* 8.6* 8.3* 8.6*  HCT 25.2* 25.7* 25.8* 25.9* 26.8*  MCV 87.5 87.7 86.9 88.4 87.9  PLT 342 381 326 369 385    Cardiac Enzymes: No results for input(s): CKTOTAL, CKMB, CKMBINDEX, TROPONINI in the last 168 hours. CBG: Recent Labs  Lab 01/13/21 1609 01/13/21 1939 01/13/21 2351 01/14/21 0358 01/14/21 0738  GLUCAP 176* 157* 181* 170* 188*     Iron Studies: No results for input(s): IRON, TIBC, TRANSFERRIN, FERRITIN in the last 72 hours. Studies/Results: No results found.  Medications: Infusions:  sodium chloride Stopped (12/20/20 0955)   anticoagulant sodium citrate     feeding supplement (JEVITY 1.5 CAL/FIBER) 1,000 mL (01/14/21  0206)   ferric gluconate (FERRLECIT) IVPB Stopped (01/13/21 1350)    Scheduled Medications:  acetaminophen  1,000 mg Per Tube Q8H   amLODipine  5 mg Per Tube Daily   apixaban  5 mg Per Tube BID   vitamin C  500 mg Per Tube TID   atorvastatin  20 mg Per Tube Daily   chlorhexidine gluconate (MEDLINE KIT)  15 mL Mouth Rinse BID   Chlorhexidine Gluconate Cloth  6 each Topical Q0600   clonazepam  0.5 mg Per Tube BID   feeding supplement (PROSource TF)  45 mL Per Tube BID   fiber  1 packet Per Tube BID   gabapentin  100 mg Per Tube Q8H   hydrocerin   Topical BID   insulin aspart  0-15 Units Subcutaneous Q4H   insulin detemir  15 Units  Subcutaneous BID   levETIRAcetam  500 mg Per Tube BID   loperamide HCl  1 mg Per Tube BID   mouth rinse  15 mL Mouth Rinse 10 times per day   multivitamin  1 tablet Per Tube QHS   pantoprazole sodium  40 mg Per Tube BID   QUEtiapine  50 mg Per Tube BID   thiamine  100 mg Per Tube Daily    have reviewed scheduled and prn medications.  Physical Exam: General: Ill-looking male, alert, awake, tracheostomy, some secretion Heart:RRR, s1s2 nl Lungs: Coarse breath sounds anteriorly Abdomen:soft, Non-tender, non-distended Extremities: No edema.. Dialysis Access: LIJ TDC in place.   Thomas [name] 01/14/2021,8:36 AM  LOS: 44 days

## 2021-01-15 DIAGNOSIS — J9601 Acute respiratory failure with hypoxia: Secondary | ICD-10-CM | POA: Diagnosis not present

## 2021-01-15 DIAGNOSIS — Z930 Tracheostomy status: Secondary | ICD-10-CM | POA: Diagnosis not present

## 2021-01-15 DIAGNOSIS — E1100 Type 2 diabetes mellitus with hyperosmolarity without nonketotic hyperglycemic-hyperosmolar coma (NKHHC): Secondary | ICD-10-CM | POA: Diagnosis not present

## 2021-01-15 LAB — RENAL FUNCTION PANEL
Albumin: 2.8 g/dL — ABNORMAL LOW (ref 3.5–5.0)
Anion gap: 20 — ABNORMAL HIGH (ref 5–15)
BUN: 64 mg/dL — ABNORMAL HIGH (ref 8–23)
CO2: 22 mmol/L (ref 22–32)
Calcium: 10.1 mg/dL (ref 8.9–10.3)
Chloride: 92 mmol/L — ABNORMAL LOW (ref 98–111)
Creatinine, Ser: 3.75 mg/dL — ABNORMAL HIGH (ref 0.61–1.24)
GFR, Estimated: 17 mL/min — ABNORMAL LOW (ref >60)
Glucose, Bld: 164 mg/dL — ABNORMAL HIGH (ref 70–99)
Phosphorus: 7.4 mg/dL — ABNORMAL HIGH (ref 2.5–4.6)
Potassium: 4.6 mmol/L (ref 3.5–5.1)
Sodium: 134 mmol/L — ABNORMAL LOW (ref 135–145)

## 2021-01-15 LAB — URINE CULTURE

## 2021-01-15 LAB — CBC WITH DIFFERENTIAL/PLATELET
Abs Immature Granulocytes: 0.18 10*3/uL — ABNORMAL HIGH (ref 0.00–0.07)
Basophils Absolute: 0.1 10*3/uL (ref 0.0–0.1)
Basophils Relative: 1 %
Eosinophils Absolute: 0.5 10*3/uL (ref 0.0–0.5)
Eosinophils Relative: 3 %
HCT: 29.7 % — ABNORMAL LOW (ref 39.0–52.0)
Hemoglobin: 10.1 g/dL — ABNORMAL LOW (ref 13.0–17.0)
Immature Granulocytes: 1 %
Lymphocytes Relative: 14 %
Lymphs Abs: 2.1 10*3/uL (ref 0.7–4.0)
MCH: 29.2 pg (ref 26.0–34.0)
MCHC: 34 g/dL (ref 30.0–36.0)
MCV: 85.8 fL (ref 80.0–100.0)
Monocytes Absolute: 1.9 10*3/uL — ABNORMAL HIGH (ref 0.1–1.0)
Monocytes Relative: 13 %
Neutro Abs: 10.3 10*3/uL — ABNORMAL HIGH (ref 1.7–7.7)
Neutrophils Relative %: 68 %
Platelets: 324 10*3/uL (ref 150–400)
RBC: 3.46 MIL/uL — ABNORMAL LOW (ref 4.22–5.81)
RDW: 16.2 % — ABNORMAL HIGH (ref 11.5–15.5)
WBC: 15.1 10*3/uL — ABNORMAL HIGH (ref 4.0–10.5)
nRBC: 0 % (ref 0.0–0.2)

## 2021-01-15 LAB — PROCALCITONIN: Procalcitonin: 0.98 ng/mL

## 2021-01-15 LAB — GLUCOSE, CAPILLARY
Glucose-Capillary: 156 mg/dL — ABNORMAL HIGH (ref 70–99)
Glucose-Capillary: 168 mg/dL — ABNORMAL HIGH (ref 70–99)
Glucose-Capillary: 191 mg/dL — ABNORMAL HIGH (ref 70–99)
Glucose-Capillary: 194 mg/dL — ABNORMAL HIGH (ref 70–99)
Glucose-Capillary: 127 mg/dL — ABNORMAL HIGH (ref 70–99)

## 2021-01-15 LAB — C-REACTIVE PROTEIN: CRP: 1.2 mg/dL — ABNORMAL HIGH (ref <1.0)

## 2021-01-15 MED ORDER — SEVELAMER CARBONATE 2.4 G PO PACK: 2.4000 g | PACK | Freq: Three times a day (TID) | ORAL | Status: DC

## 2021-01-15 MED ORDER — LIDOCAINE HCL (PF) 1 % IJ SOLN: 5.0000 mL | INTRAMUSCULAR | Status: DC | PRN

## 2021-01-15 MED ORDER — LIDOCAINE-PRILOCAINE 2.5-2.5 % EX CREA: 1.0000 "application " | TOPICAL_CREAM | CUTANEOUS | Status: DC | PRN

## 2021-01-15 MED ORDER — OXYCODONE HCL 5 MG PO TABS
7.5000 mg | ORAL_TABLET | Freq: Four times a day (QID) | ORAL | Status: DC | PRN
Start: 2021-01-15 — End: 2021-01-29
  Administered 2021-01-15 – 2021-01-29 (×17): 7.5 mg
  Filled 2021-01-15 (×19): qty 2

## 2021-01-15 MED ORDER — SODIUM CHLORIDE 0.9 % IV SOLN: 100.0000 mL | INTRAVENOUS | Status: DC | PRN

## 2021-01-15 MED ORDER — SODIUM CHLORIDE 0.9 % IV SOLN
100.0000 mL | INTRAVENOUS | Status: DC | PRN
Start: 2021-01-15 — End: 2021-01-15

## 2021-01-15 MED ORDER — HEPARIN SODIUM (PORCINE) 1000 UNIT/ML DIALYSIS: 1000.0000 [IU] | INTRAMUSCULAR | Status: DC | PRN

## 2021-01-15 MED ORDER — ALTEPLASE 2 MG IJ SOLR: 2.0000 mg | Freq: Once | INTRAMUSCULAR | Status: DC | PRN

## 2021-01-15 MED ORDER — PENTAFLUOROPROP-TETRAFLUOROETH EX AERO: 1.0000 "application " | INHALATION_SPRAY | CUTANEOUS | Status: DC | PRN

## 2021-01-15 MED ORDER — SEVELAMER CARBONATE 2.4 G PO PACK
2.4000 g | PACK | Freq: Three times a day (TID) | ORAL | Status: DC
  Administered 2021-01-15 – 2021-01-29 (×36): 2.4 g
  Filled 2021-01-15 (×45): qty 1

## 2021-01-15 MED ORDER — SODIUM CHLORIDE 0.9 % IV SOLN
2.0000 g | INTRAVENOUS | Status: DC
  Administered 2021-01-15 – 2021-01-24 (×4): 2 g via INTRAVENOUS
  Filled 2021-01-15 (×7): qty 2

## 2021-01-15 MED ORDER — HEPARIN SODIUM (PORCINE) 1000 UNIT/ML IJ SOLN
INTRAMUSCULAR | Status: AC
  Filled 2021-01-15: qty 5

## 2021-01-15 NOTE — Progress Notes (Signed)
Pharmacy Antibiotic Note  Thomas [name] is a 65 y.o. male admitted on 11/30/2020 with pneumonia.  Pharmacy has been consulted for Ceftazidime dosing. Respiratory gram stain is showing some GNR, culture is re-incubated. Team concerned for recurrent Pseudomonas tracheobronchitis. WBC up 15.1. Afebrile at this time. Patient is currently receiving dialysis.   Plan: Ceftazidime 2g IV post dialysis Tuesday/Thursday/Saturday. Follow-up culture results   Height: 5\' 8"  (172.7 cm) Weight: 103.2 kg (227 lb 8.2 oz) IBW/kg (Calculated) : 68.4  Temp (24hrs), Avg:98.5 F (36.9 C), Min:97.7 F (36.5 C), Max:99.4 F (37.4 C)  Recent Labs  Lab 01/11/21 0356 01/12/21 0157 01/13/21 0149 01/13/21 0648 01/14/21 0135 01/14/21 0906 01/15/21 0400  WBC 13.3*  --  13.5* 12.8*  --  14.2* 15.1*  CREATININE 2.71* 3.54* 3.81*  --  2.88*  --  3.75*    Estimated Creatinine Clearance: 23.2 mL/min (A) (by C-G formula based on SCr of 3.75 mg/dL (H)).    No Known Allergies  Antimicrobials this admission: Cefepime 7/24 >> 7/28, 7/31 >> 8/7   Vanc x1 7/25, 8/1 >> 8/2 Unasyn 7/30 >> 7/31 Flagyl 7/31 >> 8/7 Eraxis 8/1 >> 8/2 Cefazolin 8/15 >8/16 Ceftriaxone 8/16> 8/17 Cefepime 8/17>8/22 Ceftazidime 9/8 >>  Dose adjustments this admission:   Microbiology results: 7/24 BCx: NGF 7/25 UCx:  ngf 7/25 MRSA PCR: neg 7/26 C diff: neg 7/31 Trach: candida tropicalis (likely not pathogenic) 8/1 GI panel: neg 8/15 TA: Pseudomonas (pan sens) 9/7 TA: (GNR on gm stain) re-incubated   Thank you for allowing pharmacy to be a part of this patient's care.  Sloan Leiter, PharmD, BCPS, BCCCP Clinical Pharmacist Please refer to [facility] for [place] numbers 01/15/2021 12:05 PM

## 2021-01-15 NOTE — Progress Notes (Signed)
Physical Therapy Treatment Patient Details Name: Thomas [name] MRN: [id number] DOB: 1956-01-06 Today's Date: 01/15/2021    History of Present Illness 65 y.o. male present to ED 7/24 with high ostomy output. Patient admitted with severe sepsis and multisystem organ failure. Hospital admission complicated by volume overload, aspiration pneumonia, acute respiratory failure, AKI and ischemic colitis. Intubated 7/30. S/p trach placement 8/11. CorTrak 8/12. HD initiated 8/12. To IR 8/15 for tunneled HD cath. Pulled out trach x2 8/22 replaced by PCCM. Pulled out trach again 8/25 but unable to be reinserted with pt intubated. 8/29 to OR for stoma clean up and trach replacement.  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    PT Comments    Pt was very tired post HD, but agreed to a short session with emphasis on warm up Upper and LE exercise with graded resistance, transition to EOB and sit to standing.  Pt's wife present and we reinforced ROM and exercise for LE and UE that she could do.     Follow Up Recommendations  SNF     Equipment Recommendations  Other (comment) (TBD)    Recommendations for Other Services       Precautions / Restrictions Precautions Precautions: Fall Precaution Comments: cortrak, R subclavian tunneled HD cath, ostomy, O2 via trach collar    Mobility  Bed Mobility   Bed Mobility: Supine to Sit;Sit to Supine     Supine to sit: HOB elevated;Min assist (with rail) Sit to supine: Mod assist   General bed mobility comments: pt able to walk legs out and weakly scoot hips toward EOB, but needed minimal truncal assist up via R elbow and with use of the rail.    Transfers Overall transfer level: Needs assistance   Transfers: Sit to/from Stand Sit to Stand: Mod  assist         General transfer comment: face to face assist stand today  due to patient was fatigued and need a little more boost.  Ambulation/Gait                 Stairs             Wheelchair Mobility    Modified Rankin (Stroke Patients Only)       Balance Overall balance assessment: Needs assistance   Sitting balance-Leahy Scale: Fair     Standing balance support: Bilateral upper extremity supported;During functional activity Standing balance-Leahy Scale: Poor Standing balance comment: reliant on external assist                            Cognition Arousal/Alertness: Awake/alert Behavior During Therapy: WFL for tasks assessed/performed;Anxious Overall Cognitive Status: Within Functional Limits for tasks assessed                                 General Comments: pt follows commands with increased time and cues. Shakes head yes/no appropriately to questions. Writing to communicate now      Exercises Other Exercises Other Exercises: bil bicep/tricep presses with graded resistance x 10 reps Other Exercises: bil hip/knee flex/ext with graded assist/resistance x10 reps    General Comments        Pertinent Vitals/Pain Pain Assessment: Faces Faces Pain Scale: Hurts little more  Pain Location: stomach on the left Pain Intervention(s): Monitored during session    Home Living                      Prior Function            PT Goals (current goals can now be found in the care plan section) Acute Rehab PT Goals Patient Stated Goal: to decrease pain PT Goal Formulation: With patient Time For Goal Achievement: 01/21/21 Potential to Achieve Goals: Good Progress towards PT goals: Progressing toward goals    Frequency    Other (Comment) (will add another day)      PT Plan Current plan remains appropriate    Co-evaluation              AM-PAC PT "6 Clicks" Mobility   Outcome Measure  Help needed  turning from your back to your side while in a flat bed without using bedrails?: A Lot Help needed moving from lying on your back to sitting on the side of a flat bed without using bedrails?: A Lot Help needed moving to and from a bed to a chair (including a wheelchair)?: Total Help needed standing up from a chair using your arms (e.g., wheelchair or bedside chair)?: Total Help needed to walk in hospital room?: Total Help needed climbing 3-5 steps with a railing? : Total 6 Click Score: 8    End of Session Equipment Utilized During Treatment: Oxygen Activity Tolerance: Patient tolerated treatment well Patient left: in bed;with call bell/phone within reach;with bed alarm set;with nursing/sitter in room Nurse Communication: Mobility status PT Visit Diagnosis: Muscle weakness (generalized) (M62.81);Other abnormalities of gait and mobility (R26.89)     Time: 3491-7915 PT Time Calculation (min) (ACUTE ONLY): 29 min  Charges:  $Therapeutic Exercise: 8-22 mins $Therapeutic Activity: 8-22 mins                     01/15/2021  Ginger Carne., PT Acute Rehabilitation Services [phone]  (pager) [phone]  (office)   Tessie Fass Raaga Maeder 01/15/2021, 5:52 PM

## 2021-01-15 NOTE — Progress Notes (Signed)
NAME:  Thomas [name], MRN:  [id number], DOB:  1955-11-23, LOS: 9 ADMISSION DATE:  11/30/2020, CONSULTATION DATE:  7/30 REFERRING MD:  Riccardo Dubin, CHIEF COMPLAINT:  Worsening hypoxia/dyspnea  History of Present Illness:  65 yo M admitted 7/24 for worsening sepsis vs reaction to chemotherapy with high ostomy output and soft BP on 7/25. Has had issues during his hospitalization with recurrent aspirations and more recently worsening respiratory status.  Most recently pt developed worsening hypoxia and was requiring BIPAP w/ FIO2 of 100% and increased work of breathing.   He was admitted to intensive care and subsequently had worsening clinical status requiring intubation, CRRT, multiple pressors and pneumonia along with CT findings 8/1 concerning for ischemic bowel.  Significant Hospital Events:  7/27 Transferred to [facility], stopped abx as sepsis resolved/ruled out 7/30 AM Worsened AME, more hypoxic with worsening renal funciton, new aspiration on abx restarted 7/30 PM Pt with increased o2 requirement on 100% FIO2 on BIPAP, d/w family who wanted to proceed with intubation, pt intubated 7/31 Tracheal aspirate grew moderate candida tropicalis  8/1 Some issues with tachycardia and hypotension overnight. Reproted high ostomy output.  GI PCR negative 8/2 down to 9 mcg of Levophed, however repeat CT abdomen/pelvis showed inflammatory wall thickening and fat stranding of the descending and sigmoid colon, new since prior, consistent with nonspecific infectious, inflammatory, or ischemic colitis.  Also new dense consolidative airspace opacity in the right lower and right middle lobes 8/3 further weaning NE. Weaning sedation. Full code, full scope per family meeting 8/2. Eraxis stopped (8/1-8/3) 8/4 off pressors, plt down to 103.  8/5 remains off pressors. Moving around more, not really following commands though  8/9 failed extubation 8/8, tolerated nasal cannula for about an hour, desaturations and inability to  clear secretions-reintubated 8/8 8/10 some agitation overnight 8/11 tracheostomy placement On trach collar since 8/16 8/22 pulled trach out, replaced under bronch guidance. Has proximal area of granulation tissue that is above the bottom of the trach tube.  8/25 Removed Trach, unable to reinsert, despite multiple attempts re-intubated 8/29 Anemia, 1 unit PRBC. ENT re-placed Trach in [place].  Interim History / Subjective:  Started back on antibiotics yesterday for increasing leukocytosis. Remains afebrile. ENT planning on trach change to cuffless soon. Tired after HD this morning.  Objective   Blood pressure (!) 152/80, pulse (!) 116, temperature 98.1 F (36.7 C), temperature source Axillary, resp. rate 19, height 5\' 8"  (1.727 m), weight 101.2 kg, SpO2 98 %.    FiO2 (%):  [28 %-35 %] 28 %   Intake/Output Summary (Last 24 hours) at 01/15/2021 1610 Last data filed at 01/15/2021 1234 Gross per 24 hour  Intake 100 ml  Output 3904 ml  Net -3804 ml    Filed Weights   01/13/21 1230 01/15/21 0845 01/15/21 1234  Weight: 103.3 kg 103.2 kg 101.2 kg     Exam:  General: chronically ill appearing man lying in bed in NAD HEENT: [place]/AT, eyes anicteric Neck: cuffed trach in place Neuro: awake, alert, globally weak, moving all extremities, nodding to answer questions CV: s1s2, RRR PULM: mild rhonchi, no accessory muscle use GI: soft, NT Extremities: minimal pedal edema Skin: warm, dry, no rashes.  WBC 15 PCT 0.98 CRP 1.2 Sputum culture> GNRs Blood cx> GNTD  Resolved Hospital Problem list   Shock  HHS Afib RVR  Lactic Acidosis  Thrombocytopenia Colitis  Assessment & Plan:  Acute hypoxemic respiratory failure s/p tracheostomy Endotracheal granulation tissue- Trach revision by ENT on 8/29 after  dislodgement and inability to replace it. Aspiration pneumonia Obstructive sleep apnea Pseudomonas in tracheal aspirate-fully treated previously, but seems to have recurrence -Tolerating TC. ENT  to change to cuffless trach.  -Con't routine trach care.  -Con't mobility and pulmonary hygiene -Agree with empiric ceftazidime; follow sputum culture until finalized.   Acute kidney injury on chronic kidney disease stage IIIb Hyponatremia -On intermittent hemodialysis Tuesday, Thursday, and Saturday -Continue to monitor electrolytes -strict I/Os -renally dose meds, avoid nephrotoxic meds -barrier to discharge is inability to tolerate sitting up for a complete HD session. Needs ongoing rehabilitation.  Colorectal cancer, completed chemotherapy July 2022  Lower extremity DVT -Continue Eliquis  PCCM will continue to follow twice weekly. Please call PCCM on call pager in the interim with questions.  Julian Hy, DO 01/15/21 5:22 PM [facility]

## 2021-01-15 NOTE — Progress Notes (Signed)
SLP Cancellation Note  Patient Details Name: DEITRICH STEVE MRN: [id number] DOB: 1955/11/27   Cancelled treatment:       Reason Eval/Treat Not Completed: Patient at procedure or test/unavailable (Pt currently at HD. SLP will follow up.)  Chanley Mcenery I. Hardin Negus, [place], [place] Office number [phone] Pager [place] 01/15/2021, 9:36 AM

## 2021-01-15 NOTE — Progress Notes (Signed)
Nutrition Follow-up  DOCUMENTATION CODES:  Non-severe (moderate) malnutrition in context of chronic illness, Obesity unspecified  INTERVENTION:  Continue tube feeds via Cortrak: -Jevity 1.5 @ 65 ml/hr (1560 ml/day) - ProSource TF 45 ml BID  Tube feeding regimen provides 2420 kcal, 122 grams of protein, and 1186 ml of H2O.   - Continue renal MVI daily per tube - Continue NutriSource Fiber BID per tube  - If pt unable to make progress toward diet advancement, recommend considering longer term enteral access (i.e. PEG)  NUTRITION DIAGNOSIS:  Moderate Malnutrition related to chronic illness, cancer and cancer related treatments as evidenced by mild fat depletion, mild muscle depletion, percent weight loss. -- ongoing  GOAL:  Patient will meet greater than or equal to 90% of their needs -- met with TF  MONITOR:  Diet advancement, Labs, Weight trends, TF tolerance, Skin, I & O's  REASON FOR ASSESSMENT:  Consult Enteral/tube feeding initiation and management  ASSESSMENT:  65 y.o. male who is a former with medical history of stage 3 colon cancer s/p partial colectomy and end colostomy on 5/4, recent chemo start with 3rd cycle on 7/22, sleep apnea, seizures, epilepsy, brain tumor, HTN, HLD, GERD, asthma, peripheral neuropathy, urinary frequency, DM, MCA stroke. He presented to the ED via EMS due to abdominal pain x2 weeks, anorexia, and weakness. Admitted for close monitoring for concern of mesenteric ischemia. Patient reported that abdominal pain was worse with eating PTA.  7/31 - CRRT initiated 8/09 - CRRT stopped  8/10 - transfer to [facility] for trach placement and iHD 8/11 - s/p trach 8/12 - Cortrak placed (tip of tube in stomach) 8/15 - s/p placement of [facility] 8/22 - pulled trach out, replaced under bronch guidance, s/p bronch revealing mild area of granulation tissue of top of trach tube 8/25 - pulled trach out, transferred to ICU, emergently intubated 8/29 - trach replaced with ENT in  OR 9/04 - tx out of ICU  Pt remains on trach collar; ENT with plans to transition pt to cuffless trach.   Per Nephrology, pt continues to show no sign of renal recovery although has some UOP. Last HD today, net UF 193m.   Admission weight: 111.8 kg Current weight/post-HD weight: 101.2 kg  Current TF: Jevity@ 65 ml/hr, ProSource TF 45 ml BID  If hyperphosphatemia continues can consider transitioning back to Nepro TF  Medications: vitamin C, nutrisource fiber BID, SSI, levemir, imodium, renavit, protonix, renvela, thiamine, ferrlecit  Labs: Na 134 (L), BUN 64 (H), Cr 3.75 (H), PO4 7.4 (H), CRP 1.2 (H) CBGs: 141-191 x 24 hours  UOP: 13375mx24 hours Colostomy: 1100 ml x 24 hours I/Os: -18.4 L since admit  Diet Order:   Diet Order             Diet NPO time specified  Diet effective midnight                  EDUCATION NEEDS:  No education needs have been identified at this time  Skin:  Skin Assessment: Skin Integrity Issues: DTI: upper R and L face Stage II: coccyx, R back, L buttocks Incisions: throat (trach) Other: sloughing to L hand due to hand-foot-and-mouth disease, R thigh non-pressure friction wound  Last BM:  9/8 colostomy  Height:  Ht Readings from Last 1 Encounters:  01/01/21 _0  (1.727 m)   Weight:  Wt Readings from Last 1 Encounters:  01/15/21 101.2 kg   Ideal Body Weight:  70 kg  BMI:  Body mass index  is 33.92 kg/m.  Estimated Nutritional Needs:  Kcal:  8614-8307 Protein:  120-140 gm Fluid:  1 L + UOP    Larkin Ina, MS, RD, LDN (she/her/hers) RD pager number and weekend/on-call pager number located in [place].

## 2021-01-15 NOTE — Progress Notes (Signed)
   01/14/21 2100  Assess: MEWS Score  Level of Consciousness Alert  Assess: MEWS Score  MEWS Temp 0  MEWS Systolic 0  MEWS Pulse 1  MEWS RR 1  MEWS LOC 0  MEWS Score 2  MEWS Score Color Yellow  Assess: if the MEWS score is Yellow or Red  Were vital signs taken at a resting state? Yes  Focused Assessment No change from prior assessment  Early Detection of Sepsis Score *See Row Information* Medium  MEWS guidelines implemented *See Row Information* No, previously yellow, continue vital signs every 4 hours (patient is yellow for the same reason as previous)  Treat  MEWS Interventions Administered prn meds/treatments  Pain Scale 0-10  Pain Score 4  Pain Type Acute pain  Pain Location Back  Pain Frequency Intermittent  Patients Stated Pain Goal 0  Pain Intervention(s) Medication (See eMAR)  Take Vital Signs  Increase Vital Sign Frequency  Yellow: Q 2hr X 2 then Q 4hr X 2, if remains yellow, continue Q 4hrs  Escalate  MEWS: Escalate Yellow: discuss with charge nurse/RN and consider discussing with provider and RRT  Notify: Charge Nurse/RN  Name of Charge Nurse/RN Notified [place]  Date Charge Nurse/RN Notified 01/14/21  Time Charge Nurse/RN Notified 2100  Notify: Provider  Provider Name/Title na  Document  Patient Outcome Stabilized after interventions  Progress note created (see row info) Yes

## 2021-01-15 NOTE — Progress Notes (Signed)
[facility] PROGRESS NOTE  Thomas [name] AJO:[id number] DOB: 02/05/1956 DOA: 11/30/2020 PCP: [name], NP  Status: Remains inpatient appropriate because:Persistent severe electrolyte disturbances, Unsafe d/c plan, and Inpatient level of care appropriate due to severity of illness  Dispo: The patient is from: Home              Anticipated d/c is to: SNF versus LTAC              Patient currently is not medically stable to d/c.   Difficult to place patient Yes               Barriers to discharge: Currently has tracheostomy in place and is requiring dialysis but remains too weak to tolerate dialysis in chair which is a requirement for outpatient HD    Level of care: Progressive  Code Status: Full Family Communication: Patient only DVT prophylaxis: Eliquis COVID vaccination status: Moderna 07/08/2019 and 08/11/2019 with first booster dose given 08/05/2020   HPI:  65 y.o. male with a history of colon cancer s/p resection and colostomy May 2022 s/p 3 cycles chemotherapy (last was July 2022), T2DM with neuropathy, HTN, HLD who presented to the ED 7/24 with abdominal pain found to be febrile, tachycardic, tachypneic. Lactic acid was 6.6 and persisted at 6.8 after sepsis IV fluid bolus and cefepime. He had ARF, demand ischemia, bandemia, was hyperglycemic with negative ketones, with lactic acidosis. He developed AFib with RVR in the ED, was started on diltiazem, and was admitted to the ICU for septic shock with unclear source. Sepsis was subsequently felt to be ruled out at time of admission, presentation felt more likely to be related to cepacitabine toxicity with oral mucositis, hand/foot syndrome, and diarrhea/high output colostomy. He was transferred to hospitalist service 7/27 but developed metabolic encephalopathy with hyoxia due to aspiration pneumonia on 7/30 requiring intubation and pressor support. Failed extubation with inability to clear secretions, requiring reintubation 8/8, and  tracheostomy subsequently placed 8/11. This was pulled out 8/22, replaced under bronch guidance, again removed 8/25 requiring reintubation after failed reinsertion. ENT revised tracheostomy in the OR 8/29. He has required IHD for ARF, and also had complications of RLE DVT now on eliquis, colitis thought to be ischemic 8/2 not requiring specific intervention, Pseudomonas pneumonia having completed treatment, agitated encephalopathy which has improved, anemia requiring 4u PRBCs total, and thrombocytopenia which has resolved. On 9/4 he was transferred to hospitalist service having tolerated trach collar.  Subjective: Examined during dialysis.  Alert and appropriate to questions asked.  He is aware that ENT plans to transition him to cuffless trach.  Objective: Vitals:   01/15/21 0303 01/15/21 0346  BP: 134/78 (!) 158/88  Pulse: 99 (!) 101  Resp: 19 20  Temp:  98.5 F (36.9 C)  SpO2: 100% 98%    Intake/Output Summary (Last 24 hours) at 01/15/2021 0733 Last data filed at 01/15/2021 0622 Gross per 24 hour  Intake 500 ml  Output 2430 ml  Net -1930 ml   Filed Weights   01/13/21 0416 01/13/21 0825 01/13/21 1230  Weight: 107.2 kg 105.8 kg 103.3 kg    Exam: Constitutional: NAD, calm, comfortable Respiratory: #6 XLT cuffed trach to 28% trach collar, clear to auscultation.  No increased work of breathing-has required regular suctioning of thick tan secretions Cardiovascular: S1-S2, normotensive, regular pulse.  No peripheral edema.  Appropriate capillary refill. Abdomen: no tenderness with palpation, Bowel sounds positive. LBM 9/8; colostomy with liquid stool; cortrack for tube feedings and medication Neurologic:  CN 2-12 grossly intact. Sensation intact, Strength 3/5 x all 4 extremities.  Psychiatric: Appears to have normal judgment and insight. Alert and oriented x 3. Normal mood.    Assessment/Plan: Acute problems:  Acute hypoxic respiratory failure s/p tracheostomy due to aspiration  pneumonia  Recent Pseudomonas pneumonia with apparent recurrent tracheobronchitis -T-max 99.4 the past 24 hours.  This is associated with increasing trend and WBCs despite the addition of antibiotics to cover possible UTI on 9/7. WBCs now at 15.1 with left shift.  Also with increased tan tracheal secretions concerning for tracheobronchitis-note that patient with intermittent tachycardia and tachypnea as well as increased O2 needs with FiO2 being increased from 28% to 35% -Initial CRP 1.7 on 9/7 down to 1.2.  Procalcitonin elevated at 1.14 and as of today down to 0.98 -Tracheal aspirate culture positive for GNR's; UA with multiple species. 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   - Continue HD TTS thru L IJ TDC per nephrology.  Patient needs to tolerate sitting up in chair for dialysis treatments -Continue OOB to chair TID -Although he is making urine it appears they do not suspect full renal recovery-was also demonstrating renal injury from chemotherapy prior to this admission   High output ostomy, diarrhea:  -Negative C. diff PCR (7/26) and GI pathogen panel (8/1). Possibly ischemic colitis on CT 8/2-need to maintain blood pressure during dialysis treatment -Continue scheduled Imodium and continue fiber per tube   Anemia of critical illness, acute blood loss as well as anemia of chronic disease and iron deficiency:  - s/p 4u PRBCs this hospitalization  - Getting IV iron TTS x8 doses per nephrology. 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 Benay Spice. Started CAPOX June 2022, reduced cepecitabine dose starting 11/19/2020 due to hand/foot syndrome, reduced oxaliplatin due  to renal impairment and poor performance status.  - Dr. Benay Spice has followed during this hospitalization. Per note 8/2 "[pt] is undergoing curative therapy for colon cancer..." - Palliative care has been involved during this hospitalization, though goals remain clear: full aggressive measures desired.    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  We will increase Oxy IR to 7.5 mg.  OT/PT note that if patient continues to make progress likely would be a candidate for CIR   Acute extensive right leg DVT:  Involving CFV, SF junction, proximal femoral and profunda veins and popliteal veins. Dx 8/5.  Did not have significant edema therefore did not require thrombolysis -  Continue eliquis   New onset atrial fibrillation with RVR:  -Currently maintaining sinus rhythm w/o meds - Continue eliquis as above   Seizure disorder:  - Continue keppra   Hand/foot syndrome related to xeloda:  -Dose reduced in the outpatient setting and given acuity of current symptoms this medication has not been utilized during hospitalization - No infection noted at this time. Will continue moisturizing topical Tx   Acute metabolic  encephalopathy, agitated delirium:  -Resolved - Continue clonazepam 0.43m per tube BID and prn-taper and DC as tolerated - Continue quetiapine 5105mBID-taper and DC as tolerated   Uncontrolled T2DM on OHAs prior to admission with associated neuropathy:  HbA1c 8.7% at admission. - CBGs well-controlled currently, continue levemir 15u BID and SSI q4h.  - Continue gabapentin 10031m8h.   HTN:  - Continue norvasc   HLD:  - Continue atorvastatin   Thrombocytopenia:  -Transient likely due to critical illness. HIT Ab negative on 8/5.    Stress ulcer prophylaxis -Continue PPI but consider discontinuing since this medication can contribute to loose stools and diarrhea   ?Sepsis - initially thought to be present on admission- has been ruled out.     Data Reviewed: Basic Metabolic Panel: Recent Labs  Lab 01/09/21 0350 01/10/21 0330 01/11/21 0356 01/12/21 0157 01/13/21 0149 01/14/21 0135 01/14/21 0906 01/15/21 0400  NA 136 136 136 136 136 135  --  134*  K 2.9* 3.9 3.8 4.1 4.4 4.4  --  4.6  CL 103 100 99 96* 99 96*  --  92*  CO2 _0 --  22  GLUCOSE 128* 161* 166* 163* 167* 168*  --  164*  BUN 24* 47* 28* 46* 66* 38*  --  64*  CREATININE 2.41* 3.73* 2.71* 3.54* 3.81* 2.88*  --  3.75*  CALCIUM 7.8* 9.3 8.9 9.5 9.3 9.5  --  10.1  MG 1.8 1.9  --   --   --   --  1.9  --   PHOS 3.5  3.9 5.3* 2.7 5.0* 6.2* 5.1* 5.8* 7.4*   Liver Function Tests: Recent Labs  Lab 01/11/21 0356 01/12/21 0157 01/13/21 0149 01/14/21 0135 01/15/21 0400  ALBUMIN 2.3* 2.3* 2.3* 2.8* 2.8*    CBC: Recent Labs  Lab 01/10/21 0330 01/11/21 0356 01/13/21 0149 01/13/21 0648 01/14/21 0906 01/15/21 0400  WBC 12.4* 13.3* 13.5* 12.8* 14.2* 15.1*  NEUTROABS 8.0*  --   --   --  9.6* 10.3*  HGB 8.2* 8.6* 8.3* 8.6* 10.1* 10.1*  HCT 25.7* 25.8* 25.9* 26.8* 30.2* 29.7*  MCV 87.7 86.9 88.4 87.9 87.3 85.8  PLT 381 326 369 385 315 324    CBG: Recent Labs  Lab 01/14/21 1149  01/14/21 1558 01/14/21 1934 01/14/21 2354 01/15/21 0351  GLUCAP 141* 167* 144* 173* 156*    Scheduled Meds:  acetaminophen  1,000 mg Per Tube Q8H   amLODipine  5 mg Per Tube Daily   apixaban  5 mg Per Tube BID   vitamin C  500 mg Per Tube TID   atorvastatin  20 mg Per Tube Daily   chlorhexidine gluconate (MEDLINE KIT)  15 mL Mouth Rinse BID   Chlorhexidine Gluconate Cloth  6 each Topical Q0600   clonazepam  0.5 mg Per Tube BID   feeding supplement (PROSource TF)  45 mL Per Tube BID   fiber  1 packet Per Tube BID   gabapentin  100 mg Per Tube Q8H   hydrocerin  Topical BID   insulin aspart  0-15 Units Subcutaneous Q4H   insulin detemir  15 Units Subcutaneous BID   levETIRAcetam  500 mg Per Tube BID   loperamide HCl  1 mg Per Tube BID   mouth rinse  15 mL Mouth Rinse 10 times per day   multivitamin  1 tablet Per Tube QHS   pantoprazole sodium  40 mg Per Tube BID   QUEtiapine  50 mg Per Tube BID   thiamine  100 mg Per Tube Daily   Continuous Infusions:  sodium chloride Stopped (12/20/20 0955)   anticoagulant sodium citrate     cefTRIAXone (ROCEPHIN)  IV 1 g (01/14/21 1154)   feeding supplement (JEVITY 1.5 CAL/FIBER) 1,000 mL (01/15/21 0624)   ferric gluconate (FERRLECIT) IVPB Stopped (01/13/21 1350)    Principal Problem:   Diabetes mellitus with hyperosmolarity without hyperglycemic hyperosmolar nonketotic coma ([place]) Active Problems:   Lactic acidosis   Acute renal failure superimposed on stage 2 chronic kidney disease (HCC)   Metabolic acidosis   Tachycardia   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 ([place])   Adverse effect of chemotherapy   Pressure injury of skin   Atrial fibrillation with RVR ([place])   Sepsis ([place])   Malnutrition of moderate degree   Acute metabolic encephalopathy   Acute respiratory failure with hypoxemia (HCC)   Acute renal failure with acute cortical necrosis ([place])   Shock ([place])   Status post tracheostomy  [facility])   Consultants: PCCM General surgery Nephrology Hematology/oncology Palliative care medicine team  Procedures: 2u PRBC 7/31, 1u PRBC 8/17, 1u PRBC 8/27 Tracheostomy revision 01/05/2021 Dr. Constance Holster    Antibiotics: Vancomycin 7/24 Cefepime 7/24 - 7/27 Unasyn 7/30 >> cefepime/flagyl 7/31 - 8/6    Time spent: 40 minutes    [name] ANP  [facility] 7 am - 330 pm/M-F for direct patient care and secure chat Please refer to Amion for contact info 45  days

## 2021-01-15 NOTE — Progress Notes (Addendum)
[facility] NEPHROLOGY PROGRESS NOTE  Assessment/ Plan:  #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 CRRT initiated 7/31 and transitioned to IHD on 12/19/20.  S/p [facility] placement 12/22/20.  Continue with TTS schedule for now.   No sign of renal recovery although he has some urine output.  Plan for dialysis today.  # Volume overload/anasarca-improved with dialysis.  # Sepsis/aspiration pneumonia/VDRF:aspiration pneumonia.  Completed antibiotics course.  S/p tracheostomy, however pt removed it on 01/01/21 s/p intubation.  New tracheostomy created 01/05/21 by ENT.   # Stage IIIB colon cancer - sp partial colectomy, colostomy. On chemoRx sp 3 cycles.  # Right leg DVT: On Eliquis.  # Atrial fibrillation - on amiodarone and anticoagulation.    # Anemia of critical illness: Receiving IV iron with HD, transfuse as needed, holding erythropoietin with active malignancy.  #CKD-MBD; start sevelamer for hyperphosphatemia and low ca bath.monitor lab.  #Disposition - poor overall prognosis.  Seen by palliative care team.  Discussion ongoing for possible transfer to [facility].  The tracheostomy should be capped before he can be accepted to [facility]  Subjective: Seen and examined.  Urine output is around 1.3 L.  He is alert awake.  No new event.  He understands that he is going for dialysis today. Objective Vital signs in last 24 hours: Vitals:   01/15/21 0303 01/15/21 0346 01/15/21 0756 01/15/21 0830  BP: 134/78 (!) 158/88 (!) 144/82   Pulse: 99 (!) 101 100 (!) 101  Resp: 19 20 (!) 21 (!) 22  Temp:  98.5 F (36.9 C) 97.7 F (36.5 C)   TempSrc:  Oral Oral   SpO2: 100% 98% 97% 98%  Weight:      Height:       Weight change:   Intake/Output Summary (Last 24 hours) at 01/15/2021 0902 Last data filed at 01/15/2021 0622 Gross per 24 hour  Intake 100 ml  Output 1930 ml  Net -1830 ml        Labs: Basic  Metabolic Panel: Recent Labs  Lab 01/13/21 0149 01/14/21 0135 01/14/21 0906 01/15/21 0400  NA 136 135  --  134*  K 4.4 4.4  --  4.6  CL 99 96*  --  92*  CO2 22 23  --  22  GLUCOSE 167* 168*  --  164*  BUN 66* 38*  --  64*  CREATININE 3.81* 2.88*  --  3.75*  CALCIUM 9.3 9.5  --  10.1  PHOS 6.2* 5.1* 5.8* 7.4*    Liver Function Tests: Recent Labs  Lab 01/13/21 0149 01/14/21 0135 01/15/21 0400  ALBUMIN 2.3* 2.8* 2.8*    No results for input(s): LIPASE, AMYLASE in the last 168 hours. No results for input(s): AMMONIA in the last 168 hours. CBC: Recent Labs  Lab 01/10/21 0330 01/11/21 0356 01/13/21 0149 01/13/21 0648 01/14/21 0906 01/15/21 0400  WBC 12.4* 13.3* 13.5* 12.8* 14.2* 15.1*  NEUTROABS 8.0*  --   --   --  9.6* 10.3*  HGB 8.2* 8.6* 8.3* 8.6* 10.1* 10.1*  HCT 25.7* 25.8* 25.9* 26.8* 30.2* 29.7*  MCV 87.7 86.9 88.4 87.9 87.3 85.8  PLT 381 326 369 385 315 324    Cardiac Enzymes: No results for input(s): CKTOTAL, CKMB, CKMBINDEX, TROPONINI in the last 168 hours. CBG: Recent Labs  Lab 01/14/21 1558 01/14/21 1934 01/14/21 2354 01/15/21 0351 01/15/21 0753  GLUCAP 167* 144* 173* 156* 168*     Iron Studies:  No results for input(s): IRON, TIBC, TRANSFERRIN, FERRITIN in the last 72 hours. Studies/Results: No results found.  Medications: Infusions:  sodium chloride Stopped (12/20/20 0955)   anticoagulant sodium citrate     cefTRIAXone (ROCEPHIN)  IV 1 g (01/14/21 1154)   feeding supplement (JEVITY 1.5 CAL/FIBER) 1,000 mL (01/15/21 0624)   ferric gluconate (FERRLECIT) IVPB Stopped (01/13/21 1350)    Scheduled Medications:  acetaminophen  1,000 mg Per Tube Q8H   amLODipine  5 mg Per Tube Daily   apixaban  5 mg Per Tube BID   vitamin C  500 mg Per Tube TID   atorvastatin  20 mg Per Tube Daily   chlorhexidine gluconate (MEDLINE KIT)  15 mL Mouth Rinse BID   Chlorhexidine Gluconate Cloth  6 each Topical Q0600   clonazepam  0.5 mg Per Tube BID    feeding supplement (PROSource TF)  45 mL Per Tube BID   fiber  1 packet Per Tube BID   gabapentin  100 mg Per Tube Q8H   hydrocerin   Topical BID   insulin aspart  0-15 Units Subcutaneous Q4H   insulin detemir  15 Units Subcutaneous BID   levETIRAcetam  500 mg Per Tube BID   loperamide HCl  1 mg Per Tube BID   mouth rinse  15 mL Mouth Rinse 10 times per day   multivitamin  1 tablet Per Tube QHS   pantoprazole sodium  40 mg Per Tube BID   QUEtiapine  50 mg Per Tube BID   thiamine  100 mg Per Tube Daily    have reviewed scheduled and prn medications.  Physical Exam: General: Ill-looking male, alert awake and following commands, tracheostomy,  Heart:RRR, s1s2 nl Lungs: Coarse breath sounds anteriorly Abdomen:soft, Non-tender, non-distended Extremities: No edema.. Dialysis Access: LIJ TDC in place.   Aleathia Purdy Tanna Furry 01/15/2021,9:02 AM  LOS: 45 days

## 2021-01-16 DIAGNOSIS — E1100 Type 2 diabetes mellitus with hyperosmolarity without nonketotic hyperglycemic-hyperosmolar coma (NKHHC): Secondary | ICD-10-CM | POA: Diagnosis not present

## 2021-01-16 LAB — CBC WITH DIFFERENTIAL/PLATELET
Abs Immature Granulocytes: 0.28 10*3/uL — ABNORMAL HIGH (ref 0.00–0.07)
Basophils Absolute: 0.1 10*3/uL (ref 0.0–0.1)
Basophils Relative: 1 %
Eosinophils Absolute: 0.3 10*3/uL (ref 0.0–0.5)
Eosinophils Relative: 2 %
HCT: 31.4 % — ABNORMAL LOW (ref 39.0–52.0)
Hemoglobin: 10.1 g/dL — ABNORMAL LOW (ref 13.0–17.0)
Immature Granulocytes: 2 %
Lymphocytes Relative: 11 %
Lymphs Abs: 1.9 10*3/uL (ref 0.7–4.0)
MCH: 28.6 pg (ref 26.0–34.0)
MCHC: 32.2 g/dL (ref 30.0–36.0)
MCV: 89 fL (ref 80.0–100.0)
Monocytes Absolute: 2.2 10*3/uL — ABNORMAL HIGH (ref 0.1–1.0)
Monocytes Relative: 13 %
Neutro Abs: 12.8 10*3/uL — ABNORMAL HIGH (ref 1.7–7.7)
Neutrophils Relative %: 71 %
Platelets: 283 10*3/uL (ref 150–400)
RBC: 3.53 MIL/uL — ABNORMAL LOW (ref 4.22–5.81)
RDW: 16.2 % — ABNORMAL HIGH (ref 11.5–15.5)
WBC: 17.5 10*3/uL — ABNORMAL HIGH (ref 4.0–10.5)
nRBC: 0 % (ref 0.0–0.2)

## 2021-01-16 LAB — RENAL FUNCTION PANEL
Albumin: 2.7 g/dL — ABNORMAL LOW (ref 3.5–5.0)
Anion gap: 18 — ABNORMAL HIGH (ref 5–15)
BUN: 36 mg/dL — ABNORMAL HIGH (ref 8–23)
CO2: 23 mmol/L (ref 22–32)
Calcium: 9.5 mg/dL (ref 8.9–10.3)
Chloride: 93 mmol/L — ABNORMAL LOW (ref 98–111)
Creatinine, Ser: 2.88 mg/dL — ABNORMAL HIGH (ref 0.61–1.24)
GFR, Estimated: 24 mL/min — ABNORMAL LOW (ref >60)
Glucose, Bld: 177 mg/dL — ABNORMAL HIGH (ref 70–99)
Phosphorus: 5.2 mg/dL — ABNORMAL HIGH (ref 2.5–4.6)
Potassium: 4.5 mmol/L (ref 3.5–5.1)
Sodium: 134 mmol/L — ABNORMAL LOW (ref 135–145)

## 2021-01-16 LAB — PROCALCITONIN: Procalcitonin: 1.64 ng/mL

## 2021-01-16 LAB — GLUCOSE, CAPILLARY
Glucose-Capillary: 187 mg/dL — ABNORMAL HIGH (ref 70–99)
Glucose-Capillary: 188 mg/dL — ABNORMAL HIGH (ref 70–99)
Glucose-Capillary: 171 mg/dL — ABNORMAL HIGH (ref 70–99)
Glucose-Capillary: 156 mg/dL — ABNORMAL HIGH (ref 70–99)
Glucose-Capillary: 170 mg/dL — ABNORMAL HIGH (ref 70–99)
Glucose-Capillary: 159 mg/dL — ABNORMAL HIGH (ref 70–99)
Glucose-Capillary: 218 mg/dL — ABNORMAL HIGH (ref 70–99)

## 2021-01-16 LAB — C-REACTIVE PROTEIN: CRP: 1.9 mg/dL — ABNORMAL HIGH (ref <1.0)

## 2021-01-16 MED ORDER — CHLORHEXIDINE GLUCONATE CLOTH 2 % EX PADS
6.0000 | MEDICATED_PAD | Freq: Every day | CUTANEOUS | Status: DC
  Administered 2021-01-16 – 2021-01-27 (×10): 6 via TOPICAL

## 2021-01-16 MED ORDER — NYSTATIN 100000 UNIT/ML MT SUSP
5.0000 mL | Freq: Four times a day (QID) | OROMUCOSAL | Status: DC
  Administered 2021-01-16 – 2021-01-28 (×42): 500000 [IU]
  Filled 2021-01-16 (×40): qty 5

## 2021-01-16 MED ORDER — NYSTATIN 100000 UNIT/ML MT SUSP
5.0000 mL | Freq: Four times a day (QID) | OROMUCOSAL | Status: DC
  Filled 2021-01-16: qty 5

## 2021-01-16 MED ORDER — FLUCONAZOLE 50 MG PO TABS
50.0000 mg | ORAL_TABLET | Freq: Every day | ORAL | Status: AC
  Administered 2021-01-17 – 2021-01-22 (×6): 50 mg
  Filled 2021-01-16 (×6): qty 1

## 2021-01-16 MED ORDER — FLUCONAZOLE 100 MG PO TABS
100.0000 mg | ORAL_TABLET | Freq: Once | ORAL | Status: AC
  Administered 2021-01-16: 100 mg
  Filled 2021-01-16: qty 1

## 2021-01-16 NOTE — Progress Notes (Signed)
Occupational Therapy Treatment Patient Details Name: Thomas [name] MRN: [id number] DOB: 25-Sep-1955 Today's Date: 01/16/2021    History of present illness 65 y.o. male present to ED 7/24 with high ostomy output. Patient admitted with severe sepsis and multisystem organ failure. Hospital admission complicated by volume overload, aspiration pneumonia, acute respiratory failure, AKI and ischemic colitis. Intubated 7/30. S/p trach placement 8/11. CorTrak 8/12. HD initiated 8/12. To IR 8/15 for tunneled HD cath. Pulled out trach x2 8/22 replaced by PCCM. Pulled out trach again 8/25 but unable to be reinserted with pt intubated. 8/29 to OR for stoma clean up and trach replacement.  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   OT comments  Co-treat with PT to address bed mobility, sitting balance/tolerance, and standing from EOB. Assisted patient with donning non-skid socks with patient having complaints of pain while donning. Education on bed mobility and supine to sit with mod assist.  Patient sat on EOB and performed standing at RW with assistance of 2.  Patient did not want to take steps and was agreeable to getting in recliner but returned to supine due to changing trachs. Acute OT to continue to follow.   Follow Up Recommendations  SNF;Supervision/Assistance - 24 hour    Equipment Recommendations  Other (comment)    Recommendations for Other Services      Precautions / Restrictions Precautions Precautions: Fall Precaution Comments: cortrak, R subclavian tunneled HD cath, ostomy, O2 via trach collar       Mobility Bed Mobility Overal bed mobility: Needs Assistance Bed Mobility: Supine to Sit;Sit to Supine     Supine to sit: HOB elevated;Mod assist Sit to supine: Mod assist    General bed mobility comments: Patient was able follow directions with bed mobility and use rails to assist    Transfers Overall transfer level: Needs assistance     Sit to Stand: Mod assist         General transfer comment: Stood at bedside with hand held assist +2 to side step up towards head of bed    Balance Overall balance assessment: Needs assistance Sitting-balance support: Feet supported;Bilateral upper extremity supported Sitting balance-Leahy Scale: Fair Sitting balance - Comments: had discomfort during sitting   Standing balance support: Bilateral upper extremity supported;During functional activity Standing balance-Leahy Scale: Poor Standing balance comment: Stood with BUE on RW and limited due to complaints of pain                           ADL either performed or assessed with clinical judgement   ADL                                               Vision       Perception     Praxis      Cognition Arousal/Alertness: Awake/alert Behavior During Therapy: WFL for tasks assessed/performed;Anxious Overall Cognitive Status: Within Functional Limits for tasks assessed                                 General Comments: Patient was able to follow commands with increasted time  Exercises Exercises: General Upper Extremity Other Exercises Other Exercises: resistive UE exercises supine for elbow flexion and extension and BUE shoulder AROM   Shoulder Instructions       General Comments      Pertinent Vitals/ Pain       Pain Assessment: Faces Faces Pain Scale: Hurts even more Pain Location: Abdomen and BLE feet Pain Descriptors / Indicators: Aching;Grimacing Pain Intervention(s): Monitored during session  Home Living                                          Prior Functioning/Environment              Frequency  Min 2X/week        Progress Toward Goals  OT Goals(current  goals can now be found in the care plan section)  Progress towards OT goals: Progressing toward goals  Acute Rehab OT Goals Patient Stated Goal: to decrease pain OT Goal Formulation: With patient Time For Goal Achievement: 01/20/21 Potential to Achieve Goals: Good ADL Goals Pt Will Perform Grooming: with min assist;sitting Pt Will Perform Upper Body Dressing: with mod assist;sitting Pt Will Perform Lower Body Dressing: with max assist;sitting/lateral leans;sit to/from stand Pt Will Transfer to Toilet: with supervision;ambulating;bedside commode Pt Will Perform Toileting - Clothing Manipulation and hygiene: with supervision;sit to/from stand Pt Will Perform Tub/Shower Transfer: Tub transfer;Shower transfer;3 in Energy manager will Perform Home Exercise Program: Increased ROM;Increased strength;Both right and left upper extremity;Independently Additional ADL Goal #1: Patient will maintain static sitting balance at EOB for 15 min with supervision A in prep for ADLs. Additional ADL Goal #2: Patinet will complete sit to stand transfers with Mod A +2 in prep for ADLs. Additional ADL Goal #3: Patient will attend to 2/3 grooming tasks without cues for continuation indicating increased attention to task.  Plan Discharge plan remains appropriate    Co-evaluation    PT/OT/SLP Co-Evaluation/Treatment: Yes Reason for Co-Treatment: Complexity of the patient's impairments (multi-system involvement);For patient/therapist safety   OT goals addressed during session: Strengthening/ROM      AM-PAC OT "6 Clicks" Daily Activity     Outcome Measure   Help from another person eating meals?: Total Help from another person taking care of personal grooming?: A Lot Help from another person toileting, which includes using toliet, bedpan, or urinal?: A Lot Help from another person bathing (including washing, rinsing, drying)?: A Lot Help from another person to put on and taking off regular  upper body clothing?: Total Help from another person to put on and taking off regular lower body clothing?: Total 6 Click Score: 9    End of Session Equipment Utilized During Treatment: Oxygen;Rolling walker  OT Visit Diagnosis: Unsteadiness on feet (R26.81);Muscle weakness (generalized) (M62.81);Pain Pain - Right/Left: Right Pain - part of body: Ankle and joints of foot   Activity Tolerance Patient tolerated treatment well   Patient Left in chair;with call bell/phone within reach;with chair alarm set   Nurse Communication Mobility status        Time: 7062-3762 OT Time Calculation (min): 23 min  Charges: OT General Charges $OT Visit: 1 Visit OT Treatments $Therapeutic Exercise: 8-22 mins  Lodema Hong, Ray 01/16/2021, 12:42 PM

## 2021-01-16 NOTE — Progress Notes (Addendum)
Speech Language Pathology Treatment: Dysphagia;Passy Muir Speaking valve  Patient Details Name: Thomas [name] MRN: [id number] DOB: 08-26-1955 Today's Date: 01/16/2021 Time: 0093-8182 SLP Time Calculation (min) (ACUTE ONLY): 26 min  Assessment / Plan / Recommendation Clinical Impression  Pt was seen for treatment. He was alert and cooperative during the session. Pt tolerated PMSV for over 30 minutes with stable vitals and no respiratory distress. Oral care was provided and trials of puree and nectar thick liquids via tsp were given. Pt demonstrated coughing with the initial bolus of puree suggesting possible aspiration. Limited lingual movement continues to be noted with assessment of ROM. Length of bolus formation was prolonged and limited and inconsistent hyolaryngeal movement was palpated. Pt reported that boluses "won't go down" despite repeated attempts, and reported globus sensation. Moderate oral residue was removed from oral cavity despite via suction. Pt was able to achieve some intermittent voicing, but a breathy vocal quality was frequently observed. Pt demonstrated moderate-severely reduced respiratory support for speech. With verbal and tactile prompts for diaphragmatic breathing and for improved coordination of respiration with speech, vocal quality and speech intelligibility improved. Pt reported neck pain during the session which he reported was not impacted by PMSV use. Pt stated that his neck feels "swollen and like it's infected" and ultimately requested that the PMSV be removed. Pt's RN, attending, PCCM, and ENT advised of pt's reports. A modified barium swallow study is recommended to further elucidate the nature and severity of dysphagia, and to assist with prognostication. However, this cannot be scheduled today so it will be deferred to next week. PMSV may be used with staff given intermittent supervision. SLP will continue to follow pt.    HPI HPI: Pt is a 65 y/o male admitted on  7/25 for worsening sepsis vs reaction to chemotherapy with high ostomy output and soft BP. Pt developed worsening hypoxia and was requiring BIPAP w/ FIO2 of 100% and increased work of breathing. SLP consulted due to concern for aspiration. BSE 7/28 recommended NPO status and MBS, but this could not completed subsequently due to lethargy. CXR in AM of 7/30 showed some worsening concern of aspiration. ETT 7/30-8/8; tolerated nasal cannula for about an hour; reintubated 8/8-trach 8/11. Pt pulled out trach x2 8/22; replaced by PCCM. Bronch on 8/22 revealed endotracheal granulation tissue at trach site. Trach removed again on 8/25; unable to reinsert despite multiple attenpts; pt reintubated 8/25- trach by ENT on 8/29. BSE 8/22 limited to ice chips due to pt's refusal of other consistencies; rx NPO status with Cortrak. 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      SLP Plan  MBS       Recommendations  Diet recommendations: NPO Medication Administration: Via alternative means      Patient may use Passy-Muir Speech Valve: Intermittently with supervision PMSV Supervision: Intermittent MD: Please consider changing trach tube to : Cuffless;Smaller size         Oral Care Recommendations: Oral care QID;Staff/trained caregiver to provide oral care Follow up Recommendations: Other (comment) (TBD) SLP Visit Diagnosis: Dysphagia, unspecified (R13.10);Aphonia (R49.1) Plan: MBS       Thomas [name] I. Thomas [name], [place], [place] Office number [phone] Pager [place] I  Thomas [name] 01/16/2021, 10:30 AM

## 2021-01-16 NOTE — Progress Notes (Addendum)
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 CRRT initiated 7/31 and transitioned to IHD on 12/19/20.  S/p [facility] placement 12/22/20.  Continue with TTS schedule for now.   No sign of renal recovery although he has some urine output.  Status post HD on 9/8 with 2 L ultrafiltration.  He has some urine output as well.  Plan for next HD tomorrow.   # Volume overload/anasarca-improved with dialysis.  # Sepsis/aspiration pneumonia/VDRF:aspiration pneumonia.  Completed antibiotics course.  S/p tracheostomy, however pt removed it on 01/01/21 s/p intubation.  New tracheostomy created 01/05/21 by ENT.   # Stage IIIB colon cancer - sp partial colectomy, colostomy. On chemoRx sp 3 cycles.  # Right leg DVT: On Eliquis.  # Atrial fibrillation - on amiodarone and anticoagulation.    # Anemia of critical illness: Received IV iron as concern for infection and on abx, transfuse as needed, holding erythropoietin with active malignancy.  #CKD-MBD; started sevelamer for hyperphosphatemia, phosphorus level improving.  #Disposition - poor overall prognosis.  Seen by palliative care team.  Discussion ongoing for possible transfer to [facility].  The tracheostomy should be capped before he can be accepted to [facility]  Subjective: Seen and examined.  Tolerating dialysis well.  Remains on vent.  Not on pressors.  No new event. Objective Vital signs in last 24 hours: Vitals:   01/16/21 0301 01/16/21 0500 01/16/21 0732 01/16/21 0824  BP: (!) 121/59  130/79   Pulse: (!) 108  (!) 101   Resp: (!) 21  20   Temp:   97.8 F (36.6 C)   TempSrc:   Oral   SpO2: 99%  99% 100%  Weight:  101.4 kg    Height:       Weight change:   Intake/Output Summary (Last 24 hours) at 01/16/2021 0934 Last data filed at 01/16/2021 0400 Gross per 24 hour  Intake --  Output 3374 ml  Net -3374 ml         Labs: Basic Metabolic Panel: Recent Labs  Lab 01/14/21 0135 01/14/21 0906 01/15/21 0400 01/16/21 0039  NA 135  --  134* 134*  K 4.4  --  4.6 4.5  CL 96*  --  92* 93*  CO2 23  --  22 23  GLUCOSE 168*  --  164* 177*  BUN 38*  --  64* 36*  CREATININE 2.88*  --  3.75* 2.88*  CALCIUM 9.5  --  10.1 9.5  PHOS 5.1* 5.8* 7.4* 5.2*    Liver Function Tests: Recent Labs  Lab 01/14/21 0135 01/15/21 0400 01/16/21 0039  ALBUMIN 2.8* 2.8* 2.7*    No results for input(s): LIPASE, AMYLASE in the last 168 hours. No results for input(s): AMMONIA in the last 168 hours. CBC: Recent Labs  Lab 01/10/21 0330 01/11/21 0356 01/13/21 0149 01/13/21 0648 01/14/21 0906 01/15/21 0400  WBC 12.4* 13.3* 13.5* 12.8* 14.2* 15.1*  NEUTROABS 8.0*  --   --   --  9.6* 10.3*  HGB 8.2* 8.6* 8.3* 8.6* 10.1* 10.1*  HCT 25.7* 25.8* 25.9* 26.8* 30.2* 29.7*  MCV 87.7 86.9 88.4 87.9 87.3 85.8  PLT 381 326 369 385 315 324    Cardiac Enzymes: No results for input(s): CKTOTAL, CKMB, CKMBINDEX, TROPONINI in the last 168 hours. CBG: Recent Labs  Lab 01/15/21 1608 01/15/21 1905 01/15/21 2314 01/16/21 0259 01/16/21 0735  GLUCAP 194* 127* 187*  188* 171*     Iron Studies: No results for input(s): IRON, TIBC, TRANSFERRIN, FERRITIN in the last 72 hours. Studies/Results: No results found.  Medications: Infusions:  sodium chloride Stopped (12/20/20 0955)   anticoagulant sodium citrate     cefTAZidime (FORTAZ)  IV 2 g (01/15/21 1326)   feeding supplement (JEVITY 1.5 CAL/FIBER) 1,000 mL (01/15/21 0624)   ferric gluconate (FERRLECIT) IVPB 125 mg (01/15/21 1052)    Scheduled Medications:  acetaminophen  1,000 mg Per Tube Q8H   amLODipine  5 mg Per Tube Daily   apixaban  5 mg Per Tube BID   vitamin C  500 mg Per Tube TID   atorvastatin  20 mg Per Tube Daily   chlorhexidine gluconate (MEDLINE KIT)  15 mL Mouth Rinse BID   Chlorhexidine Gluconate Cloth  6 each Topical Q0600   clonazepam   0.5 mg Per Tube BID   feeding supplement (PROSource TF)  45 mL Per Tube BID   fiber  1 packet Per Tube BID   gabapentin  100 mg Per Tube Q8H   hydrocerin   Topical BID   insulin aspart  0-15 Units Subcutaneous Q4H   insulin detemir  15 Units Subcutaneous BID   levETIRAcetam  500 mg Per Tube BID   loperamide HCl  1 mg Per Tube BID   mouth rinse  15 mL Mouth Rinse 10 times per day   multivitamin  1 tablet Per Tube QHS   pantoprazole sodium  40 mg Per Tube BID   QUEtiapine  50 mg Per Tube BID   sevelamer carbonate  2.4 g Per Tube TID WC   thiamine  100 mg Per Tube Daily    have reviewed scheduled and prn medications.  Physical Exam: General: Ill-looking male, alert awake, tracheostomy  Heart:RRR, s1s2 nl Lungs: Coarse breath sounds anteriorly Abdomen:soft, Non-tender, non-distended Extremities: No edema.. Dialysis Access: LIJ TDC in place.   Thomas [name] Thomas [name] 01/16/2021,9:34 AM  LOS: 46 days

## 2021-01-16 NOTE — Progress Notes (Addendum)
[facility] PROGRESS NOTE  Thomas [name] UKG:[id number] DOB: [birth year] DOA: 11/30/2020 PCP: [name], NP  Status: Remains inpatient appropriate because:Persistent severe electrolyte disturbances, Unsafe d/c plan, and Inpatient level of care appropriate due to severity of illness  Dispo: The patient is from: Home              Anticipated d/c is to: SNF versus LTAC              Patient currently is not medically stable to d/c.   Difficult to place patient Yes               Barriers to discharge: Currently has tracheostomy in place and is requiring dialysis but remains too weak to tolerate dialysis in chair which is a requirement for outpatient HD    Level of care: Progressive  Code Status: Full Family Communication: Patient only; attempted to contact his wife Thomas [name] both on the home phone and the mobile phone but was unsuccessful in reaching her DVT prophylaxis: Eliquis COVID vaccination status: Moderna 07/08/2019 and 08/11/2019 with first booster dose given 08/05/2020   HPI:  65 y.o. male with a history of colon cancer s/p resection and colostomy May 2022 s/p 3 cycles chemotherapy (last was July 2022), T2DM with neuropathy, HTN, HLD who presented to the ED 7/24 with abdominal pain found to be febrile, tachycardic, tachypneic. Lactic acid was 6.6 and persisted at 6.8 after sepsis IV fluid bolus and cefepime. He had ARF, demand ischemia, bandemia, was hyperglycemic with negative ketones, with lactic acidosis. He developed AFib with RVR in the ED, was started on diltiazem, and was admitted to the ICU for septic shock with unclear source. Sepsis was subsequently felt to be ruled out at time of admission, presentation felt more likely to be related to cepacitabine toxicity with oral mucositis, hand/foot syndrome, and diarrhea/high output colostomy. He was transferred to hospitalist service 7/27 but developed metabolic encephalopathy with hyoxia due to aspiration pneumonia on 7/30 requiring  intubation and pressor support. Failed extubation with inability to clear secretions, requiring reintubation 8/8, and tracheostomy subsequently placed 8/11. This was pulled out 8/22, replaced under bronch guidance, again removed 8/25 requiring reintubation after failed reinsertion. ENT revised tracheostomy in the OR 8/29. He has required IHD for ARF, and also had complications of RLE DVT now on eliquis, colitis thought to be ischemic 8/2 not requiring specific intervention, Pseudomonas pneumonia having completed treatment, agitated encephalopathy which has improved, anemia requiring 4u PRBCs total, and thrombocytopenia which has resolved. On 9/4 he was transferred to hospitalist service having tolerated trach collar.  Subjective: Alert.  Able to phonate occasionally around the trach that best communicates by writing notes.  No complaints.  Later while working with SLP patient began complaining of significant throat pain around trach site to the point he was crying.  SLP plans to notify Dr. Constance Holster.  Objective: Vitals:   01/15/21 2320 01/16/21 0301  BP: 121/75 (!) 121/59  Pulse:  (!) 108  Resp:  (!) 21  Temp: 98.5 F (36.9 C)   SpO2:  99%    Intake/Output Summary (Last 24 hours) at 01/16/2021 0732 Last data filed at 01/16/2021 0400 Gross per 24 hour  Intake --  Output 3374 ml  Net -3374 ml   Filed Weights   01/15/21 0845 01/15/21 1234 01/16/21 0500  Weight: 103.2 kg 101.2 kg 101.4 kg    Exam: Constitutional: NAD, calm, comfortable Respiratory: Changed to #6 XLT uncuffed trach 9/9; 28% trach collar, clear to auscultation.  No increased work of breathing-wet sounding cough with thick secretions expectorated Cardiovascular: S1-S2, normotensive, regular pulse.  No peripheral edema.  Normal capillary refill. Abdomen: Soft and nontender, Bowel sounds positive. LBM 9/8; colostomy with liquid stool; cortrack for tube feedings and medication Neurologic: CN 2-12 grossly intact. Sensation intact,  Strength 3/5 x all 4 extremities.  Psychiatric: Appears to have normal judgment and insight. Alert and oriented x 3. Normal mood.    Assessment/Plan: Acute problems:  Acute hypoxic respiratory failure s/p tracheostomy due to aspiration pneumonia  Recent Pseudomonas pneumonia with apparent recurrent tracheobronchitis -Symptoms consistent with tracheobronchitis likely secondary to recurrent Pseudomonas -WBC: 12.8 > 14.2 > 15.1; CRP: 1.7 > 1.2 > 0.9; PCT: 1.14 > 0.98 > 1.64 -Tracheal aspirate culture positive for GNR's; UA with multiple species. -Continue Fortaz with pharmacy dosing till final cultures resulted -ENT plans to change to cuffless trach to facilitate PMV training -Patient having neck pain and excessive tracheal and peritracheal secretions so ENT may need to write additional wound care orders after trach changed and insertion site can be better examined   Acute renal failure on stage IIIb CKD: Presumed ischemic ATN -started CRRT 7/31 > iHD 8/12 s/p left IJ [facility] 8/15.   - Continue HD TTS thru L IJ [facility]DC per nephrology.  Patient needs to tolerate sitting up in chair for dialysis treatments -Continue OOB to chair TID -Not expected to recover renal function therefore will be on permanent HD   High output ostomy, diarrhea:  -Negative C. diff PCR (7/26) and GI pathogen panel (8/1). Possibly ischemic colitis on CT 8/2-need to maintain blood pressure during dialysis treatment -Continue scheduled Imodium and continue fiber per tube -Will DC PPI twice daily since this class of drug can promote diarrhea   Anemia of critical illness, acute blood loss as well as anemia of chronic disease and iron deficiency:  - s/p 4u PRBCs this hospitalization  - Getting IV iron TTS x8 doses per nephrology. 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 Benay Spice. Started CAPOX June 2022, reduced  cepecitabine dose starting 11/19/2020 due to hand/foot syndrome, reduced oxaliplatin due to renal impairment and poor performance status.  - Dr. Benay Spice has followed during this hospitalization. Per note 8/2 "[pt] is undergoing curative therapy for colon cancer..." - Palliative care has been involved during this hospitalization, though goals remain clear: full aggressive measures desired.    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  Continue Oxy IR to 7.5 mg.  OT/PT note that if patient continues to make progress likely would be a candidate for CIR   Acute extensive right leg DVT:  Involving CFV, SF junction, proximal femoral and profunda veins and popliteal veins.  Dx 8/5.  Did not have significant edema therefore did not require thrombolysis - Continue eliquis   New onset atrial fibrillation with RVR:  -Currently maintaining sinus rhythm w/o meds - Continue eliquis as above   Seizure disorder:  - Continue keppra   Hand/foot syndrome related to xeloda:  -Dose reduced in the outpatient setting and given acuity of current symptoms this medication has not been utilized during hospitalization - No infection noted  at this time. Will continue moisturizing topical Tx   Acute metabolic encephalopathy, agitated delirium:  -Resolved - Continue clonazepam 0.92m per tube BID and prn-taper and DC as tolerated - Continue quetiapine 54mBID-taper and DC as tolerated   Uncontrolled T2DM on OHAs prior to admission with associated neuropathy:  HbA1c 8.7% at admission. - CBGs well-controlled currently, continue levemir 15u BID and SSI q4h.  - Continue gabapentin 10082m8h.   HTN:  - Continue norvasc   HLD:  - Continue atorvastatin   Thrombocytopenia:  -Transient likely due to critical illness. HIT Ab negative on 8/5.    Stress ulcer prophylaxis -Continue PPI but consider discontinuing since this medication can contribute to loose stools and diarrhea   ?Sepsis - initially thought to be present on admission- has been ruled out.     Data Reviewed: Basic Metabolic Panel: Recent Labs  Lab 01/10/21 0330 01/11/21 0356 01/12/21 0157 01/13/21 0149 01/14/21 0135 01/14/21 0906 01/15/21 0400 01/16/21 0039  NA 136   < > 136 136 135  --  134* 134*  K 3.9   < > 4.1 4.4 4.4  --  4.6 4.5  CL 100   < > 96* 99 96*  --  92* 93*  CO2 23   < > _0 --  22 23  GLUCOSE 161*   < > 163* 167* 168*  --  164* 177*  BUN 47*   < > 46* 66* 38*  --  64* 36*  CREATININE 3.73*   < > 3.54* 3.81* 2.88*  --  3.75* 2.88*  CALCIUM 9.3   < > 9.5 9.3 9.5  --  10.1 9.5  MG 1.9  --   --   --   --  1.9  --   --   PHOS 5.3*   < > 5.0* 6.2* 5.1* 5.8* 7.4* 5.2*   < > = values in this interval not displayed.   Liver Function Tests: Recent Labs  Lab 01/12/21 0157 01/13/21 0149 01/14/21 0135 01/15/21 0400 01/16/21 0039  ALBUMIN 2.3* 2.3* 2.8* 2.8* 2.7*    CBC: Recent Labs  Lab 01/10/21 0330 01/11/21 0356 01/13/21 0149 01/13/21 0648 01/14/21 0906 01/15/21 0400  WBC 12.4* 13.3* 13.5* 12.8* 14.2* 15.1*  NEUTROABS 8.0*  --   --   --  9.6* 10.3*  HGB 8.2* 8.6* 8.3* 8.6* 10.1* 10.1*  HCT 25.7* 25.8* 25.9* 26.8*  30.2* 29.7*  MCV 87.7 86.9 88.4 87.9 87.3 85.8  PLT 381 326 369 385 315 324    CBG: Recent Labs  Lab 01/15/21 1324 01/15/21 1608 01/15/21 1905 01/15/21 2314 01/16/21 0259  GLUCAP 191* 194* 127* 187* 188*    Scheduled Meds:  acetaminophen  1,000 mg Per Tube Q8H   amLODipine  5 mg Per Tube Daily   apixaban  5 mg Per Tube BID   vitamin C  500 mg Per Tube TID   atorvastatin  20 mg Per Tube Daily   chlorhexidine gluconate (MEDLINE KIT)  15 mL Mouth Rinse BID  Chlorhexidine Gluconate Cloth  6 each Topical Q0600   clonazepam  0.5 mg Per Tube BID   feeding supplement (PROSource TF)  45 mL Per Tube BID   fiber  1 packet Per Tube BID   gabapentin  100 mg Per Tube Q8H   hydrocerin   Topical BID   insulin aspart  0-15 Units Subcutaneous Q4H   insulin detemir  15 Units Subcutaneous BID   levETIRAcetam  500 mg Per Tube BID   loperamide HCl  1 mg Per Tube BID   mouth rinse  15 mL Mouth Rinse 10 times per day   multivitamin  1 tablet Per Tube QHS   pantoprazole sodium  40 mg Per Tube BID   QUEtiapine  50 mg Per Tube BID   sevelamer carbonate  2.4 g Per Tube TID WC   thiamine  100 mg Per Tube Daily   Continuous Infusions:  sodium chloride Stopped (12/20/20 0955)   anticoagulant sodium citrate     cefTAZidime (FORTAZ)  IV 2 g (01/15/21 1326)   feeding supplement (JEVITY 1.5 CAL/FIBER) 1,000 mL (01/15/21 0624)   ferric gluconate (FERRLECIT) IVPB 125 mg (01/15/21 1052)    Principal Problem:   Diabetes mellitus with hyperosmolarity without hyperglycemic hyperosmolar nonketotic coma ([place]) Active Problems:   Lactic acidosis   Acute renal failure superimposed on stage 2 chronic kidney disease (HCC)   Metabolic acidosis   Tachycardia   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 ([place])   Adverse effect of chemotherapy   Pressure injury of skin   Atrial fibrillation with RVR ([place])   Sepsis ([place])   Malnutrition of moderate degree   Acute metabolic encephalopathy    Acute respiratory failure with hypoxemia (HCC)   Acute renal failure with acute cortical necrosis ([place])   Shock ([place])   Status post tracheostomy [facility])   Consultants: PCCM General surgery Nephrology Hematology/oncology Palliative care medicine team  Procedures: 2u PRBC 7/31, 1u PRBC 8/17, 1u PRBC 8/27 Tracheostomy revision 01/05/2021 Dr. Constance Holster    Antibiotics: Vancomycin 7/24 Cefepime 7/24 - 7/27 Unasyn 7/30 >> cefepime/flagyl 7/31 - 8/6     Time spent: 40 minutes    [name] ANP  [facility] 7 am - 330 pm/M-F for direct patient care and secure chat Please refer to Amion for contact info 46  days

## 2021-01-16 NOTE — Progress Notes (Signed)
Physical Therapy Treatment Patient Details Name: Thomas [name] MRN: [id number] DOB: November 29, 1955 Today's Date: 01/16/2021    History of Present Illness 65 y.o. male present to ED 7/24 with high ostomy output. Patient admitted with severe sepsis and multisystem organ failure. Hospital admission complicated by volume overload, aspiration pneumonia, acute respiratory failure, AKI and ischemic colitis. Intubated 7/30. S/p trach placement 8/11. CorTrak 8/12. HD initiated 8/12. To IR 8/15 for tunneled HD cath. Pulled out trach x2 8/22 replaced by PCCM. Pulled out trach again 8/25 but unable to be reinserted with pt intubated. 8/29 to OR for stoma clean up and trach replacement.  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    PT Comments    Pt vocal today with/without PMV placement.  Pt related he had been doing exercise this morning "for an hour"   Emphasis on warm up exercise, transition to EOB with rail and assist up via R elbow, sitting balance EOB, sit to stands at EOB into the RW and then scooting to [facility] before return to supine for trach change out.   Follow Up Recommendations  SNF     Equipment Recommendations  Other (comment) (TBA next venue)    Recommendations for Other Services       Precautions / Restrictions Precautions Precautions: Fall Precaution Comments: cortrak, R subclavian tunneled HD cath, ostomy, O2 via trach collar    Mobility  Bed Mobility Overal bed mobility: Needs Assistance Bed Mobility: Supine to Sit;Sit to Supine     Supine to sit: HOB elevated;Mod assist Sit to supine: Mod assist   General bed mobility comments: Patient was able follow directions with bed mobility and use rails to assist    Transfers Overall transfer level: Needs assistance      Sit to Stand: Mod assist;+2 safety/equipment         General transfer comment: Stood at bedside with hand held assist +2 to side step up towards head of bed  3 trials total, 2 in the RW and 1 face to face assist up toward [facility]  Ambulation/Gait                 Stairs             Wheelchair Mobility    Modified Rankin (Stroke Patients Only)       Balance Overall balance assessment: Needs assistance Sitting-balance support: Feet supported;Bilateral upper extremity supported Sitting balance-Leahy Scale: Fair Sitting balance - Comments: had discomfort during sitting   Standing balance support: Bilateral upper extremity supported;During functional activity Standing balance-Leahy Scale: Poor Standing balance comment: Stood with BUE on RW and limited due to complaints of pain                            Cognition Arousal/Alertness: Awake/alert Behavior During Therapy: WFL for tasks assessed/performed;Anxious Overall Cognitive Status: Within Functional Limits for tasks assessed                                 General Comments: Patient was able to follow commands with increasted time      Exercises Other Exercises Other Exercises: resistive UE exercises supine for elbow flexion and extension and BUE shoulder AROM Other Exercises: bil hip/knee flex/ext with  graded assist/resistance x10 reps    General Comments        Pertinent Vitals/Pain Pain Assessment: Faces Faces Pain Scale: Hurts even more Pain Location: Abdomen and BLE feet Pain Descriptors / Indicators: Aching;Grimacing Pain Intervention(s): Monitored during session    Home Living                      Prior Function            PT Goals (current goals can now be found in the care plan section) Acute Rehab PT Goals Patient Stated Goal: to decrease pain PT Goal Formulation: With patient Time For Goal Achievement: 01/21/21 Potential to Achieve Goals:  Good Progress towards PT goals: Progressing toward goals    Frequency    Min 3X/week      PT Plan Current plan remains appropriate    Co-evaluation PT/OT/SLP Co-Evaluation/Treatment: Yes Reason for Co-Treatment: Complexity of the patient's impairments (multi-system involvement) PT goals addressed during session: Mobility/safety with mobility OT goals addressed during session: Strengthening/ROM      AM-PAC PT "6 Clicks" Mobility   Outcome Measure  Help needed turning from your back to your side while in a flat bed without using bedrails?: A Lot Help needed moving from lying on your back to sitting on the side of a flat bed without using bedrails?: A Lot Help needed moving to and from a bed to a chair (including a wheelchair)?: Total Help needed standing up from a chair using your arms (e.g., wheelchair or bedside chair)?: Total Help needed to walk in hospital room?: Total Help needed climbing 3-5 steps with a railing? : Total 6 Click Score: 8    End of Session Equipment Utilized During Treatment: Oxygen Activity Tolerance: Patient tolerated treatment well;Patient limited by pain Patient left: in chair;with call bell/phone within reach;with chair alarm set Nurse Communication: Mobility status PT Visit Diagnosis: Muscle weakness (generalized) (M62.81);Other abnormalities of gait and mobility (R26.89)     Time: 3016-0109 PT Time Calculation (min) (ACUTE ONLY): 23 min  Charges:  $Therapeutic Activity: 8-22 mins                     01/16/2021  Thomas Carne., PT Acute Rehabilitation Services [phone]  (pager) [phone]  (office)   Thomas [name] 01/16/2021, 1:21 PM

## 2021-01-16 NOTE — Progress Notes (Signed)
Tracheostomy changed to a #6 uncuffed XLT.  No problems.  Call if additional input is needed.

## 2021-01-16 NOTE — Progress Notes (Signed)
Pharmacy Antibiotic Note  Thomas [name] is a 65 y.o. male admitted on 11/30/2020 with  oropharyngeal candidiasis .  Pharmacy has been consulted for Fluconazole dosing. Dialysis dependent AKI  Plan: Fluconazole 100 mg per tube X 1, then 50 mg per tube daily for 6 doses  Height: 5\' 8"  (172.7 cm) Weight: 101.4 kg (223 lb 8.7 oz) IBW/kg (Calculated) : 68.4  Temp (24hrs), Avg:98.2 F (36.8 C), Min:97.8 F (36.6 C), Max:98.6 F (37 C)  Recent Labs  Lab 01/12/21 0157 01/13/21 0149 01/13/21 0648 01/14/21 0135 01/14/21 0906 01/15/21 0400 01/16/21 0039 01/16/21 0041  WBC  --  13.5* 12.8*  --  14.2* 15.1*  --  17.5*  CREATININE 3.54* 3.81*  --  2.88*  --  3.75* 2.88*  --     Estimated Creatinine Clearance: 29.9 mL/min (A) (by C-G formula based on SCr of 2.88 mg/dL (H)).    No Known Allergies  Antimicrobials this admission: Diflucan 9/9 >>   Thank you for allowing pharmacy to be a part of this patient's care.  Alanda Slim, PharmD, [facility] Clinical Pharmacist Please see AMION for all Pharmacists' Contact Phone Numbers 01/16/2021, 5:55 PM

## 2021-01-17 DIAGNOSIS — G9341 Metabolic encephalopathy: Secondary | ICD-10-CM | POA: Diagnosis not present

## 2021-01-17 DIAGNOSIS — N170 Acute kidney failure with tubular necrosis: Secondary | ICD-10-CM | POA: Diagnosis not present

## 2021-01-17 DIAGNOSIS — N171 Acute kidney failure with acute cortical necrosis: Secondary | ICD-10-CM | POA: Diagnosis not present

## 2021-01-17 DIAGNOSIS — E1100 Type 2 diabetes mellitus with hyperosmolarity without nonketotic hyperglycemic-hyperosmolar coma (NKHHC): Secondary | ICD-10-CM | POA: Diagnosis not present

## 2021-01-17 LAB — HEPATIC FUNCTION PANEL
ALT: 16 U/L (ref 0–44)
AST: 12 U/L — ABNORMAL LOW (ref 15–41)
Albumin: 2.7 g/dL — ABNORMAL LOW (ref 3.5–5.0)
Alkaline Phosphatase: 103 U/L (ref 38–126)
Bilirubin, Direct: <0.1 mg/dL (ref 0.0–0.2)
Total Bilirubin: 0.8 mg/dL (ref 0.3–1.2)
Total Protein: 8.2 g/dL — ABNORMAL HIGH (ref 6.5–8.1)

## 2021-01-17 LAB — CBC WITH DIFFERENTIAL/PLATELET
Abs Immature Granulocytes: 0.17 10*3/uL — ABNORMAL HIGH (ref 0.00–0.07)
Basophils Absolute: 0.1 10*3/uL (ref 0.0–0.1)
Basophils Relative: 1 %
Eosinophils Absolute: 0.7 10*3/uL — ABNORMAL HIGH (ref 0.0–0.5)
Eosinophils Relative: 5 %
HCT: 27.7 % — ABNORMAL LOW (ref 39.0–52.0)
Hemoglobin: 9.1 g/dL — ABNORMAL LOW (ref 13.0–17.0)
Immature Granulocytes: 1 %
Lymphocytes Relative: 12 %
Lymphs Abs: 1.7 10*3/uL (ref 0.7–4.0)
MCH: 28.8 pg (ref 26.0–34.0)
MCHC: 32.9 g/dL (ref 30.0–36.0)
MCV: 87.7 fL (ref 80.0–100.0)
Monocytes Absolute: 1.7 10*3/uL — ABNORMAL HIGH (ref 0.1–1.0)
Monocytes Relative: 12 %
Neutro Abs: 10.1 10*3/uL — ABNORMAL HIGH (ref 1.7–7.7)
Neutrophils Relative %: 69 %
Platelets: 282 10*3/uL (ref 150–400)
RBC: 3.16 MIL/uL — ABNORMAL LOW (ref 4.22–5.81)
RDW: 15.9 % — ABNORMAL HIGH (ref 11.5–15.5)
WBC: 14.5 10*3/uL — ABNORMAL HIGH (ref 4.0–10.5)
nRBC: 0 % (ref 0.0–0.2)

## 2021-01-17 LAB — RENAL FUNCTION PANEL
Albumin: 2.7 g/dL — ABNORMAL LOW (ref 3.5–5.0)
Anion gap: 17 — ABNORMAL HIGH (ref 5–15)
BUN: 66 mg/dL — ABNORMAL HIGH (ref 8–23)
CO2: 22 mmol/L (ref 22–32)
Calcium: 9.6 mg/dL (ref 8.9–10.3)
Chloride: 93 mmol/L — ABNORMAL LOW (ref 98–111)
Creatinine, Ser: 4.09 mg/dL — ABNORMAL HIGH (ref 0.61–1.24)
GFR, Estimated: 16 mL/min — ABNORMAL LOW (ref >60)
Glucose, Bld: 178 mg/dL — ABNORMAL HIGH (ref 70–99)
Phosphorus: 8 mg/dL — ABNORMAL HIGH (ref 2.5–4.6)
Potassium: 5 mmol/L (ref 3.5–5.1)
Sodium: 132 mmol/L — ABNORMAL LOW (ref 135–145)

## 2021-01-17 LAB — GLUCOSE, CAPILLARY
Glucose-Capillary: 162 mg/dL — ABNORMAL HIGH (ref 70–99)
Glucose-Capillary: 157 mg/dL — ABNORMAL HIGH (ref 70–99)
Glucose-Capillary: 150 mg/dL — ABNORMAL HIGH (ref 70–99)
Glucose-Capillary: 176 mg/dL — ABNORMAL HIGH (ref 70–99)

## 2021-01-17 LAB — MAGNESIUM: Magnesium: 2.1 mg/dL (ref 1.7–2.4)

## 2021-01-17 LAB — HEPATITIS B CORE ANTIBODY, TOTAL: Hep B Core Total Ab: NONREACTIVE

## 2021-01-17 LAB — HEPATITIS B SURFACE ANTIGEN: Hepatitis B Surface Ag: NONREACTIVE

## 2021-01-17 MED ORDER — HEPARIN SODIUM (PORCINE) 1000 UNIT/ML IJ SOLN
INTRAMUSCULAR | Status: AC
  Filled 2021-01-17: qty 4

## 2021-01-17 NOTE — Progress Notes (Signed)
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 CRRT initiated 7/31 and transitioned to IHD on 12/19/20.  S/p [facility] placement 12/22/20.  Continue with TTS schedule for now.   No sign of renal recovery although he has some urine output.  Plan for regular dialysis today.  # Volume overload/anasarca-improved with dialysis.  # Sepsis/aspiration pneumonia/VDRF:aspiration pneumonia.  Completed antibiotics course.  S/p tracheostomy, however pt removed it on 01/01/21 s/p intubation.  New tracheostomy created 01/05/21 by ENT.   # Stage IIIB colon cancer - sp partial colectomy, colostomy. On chemoRx sp 3 cycles.  # Right leg DVT: On Eliquis.  # Atrial fibrillation - on amiodarone and anticoagulation.    # Anemia of critical illness: DC further IV iron as concern for infection and on abx, transfuse as needed, holding erythropoietin with active malignancy.  Monitor hemoglobin and transfuse as needed.  #CKD-MBD; started sevelamer for hyperphosphatemia on 9/8, monitor phosphorus level.  #Disposition - poor overall prognosis.  Seen by palliative care team.  Discussion ongoing for possible transfer to LTAC/[facility].  The tracheostomy should be capped before he can be accepted to [facility]  Subjective: Seen and examined.  No new event.  Alert awake and plan for dialysis today.  Objective Vital signs in last 24 hours: Vitals:   01/17/21 0023 01/17/21 0259 01/17/21 0453 01/17/21 0846  BP:  132/71    Pulse: 100  (!) 101   Resp: 18  19   Temp:  98.1 F (36.7 C)    TempSrc:  Oral    SpO2: 100%  100% 100%  Weight:      Height:       Weight change:   Intake/Output Summary (Last 24 hours) at 01/17/2021 0853 Last data filed at 01/17/2021 0700 Gross per 24 hour  Intake 920 ml  Output 250 ml  Net 670 ml        Labs: Basic Metabolic Panel: Recent Labs  Lab  01/15/21 0400 01/16/21 0039 01/17/21 0500  NA 134* 134* 132*  K 4.6 4.5 5.0  CL 92* 93* 93*  CO2 22 23 22   GLUCOSE 164* 177* 178*  BUN 64* 36* 66*  CREATININE 3.75* 2.88* 4.09*  CALCIUM 10.1 9.5 9.6  PHOS 7.4* 5.2* 8.0*    Liver Function Tests: Recent Labs  Lab 01/15/21 0400 01/16/21 0039 01/17/21 0500  ALBUMIN 2.8* 2.7* 2.7*    No results for input(s): LIPASE, AMYLASE in the last 168 hours. No results for input(s): AMMONIA in the last 168 hours. CBC: Recent Labs  Lab 01/13/21 0149 01/13/21 0648 01/14/21 0906 01/15/21 0400 01/16/21 0041  WBC 13.5* 12.8* 14.2* 15.1* 17.5*  NEUTROABS  --   --  9.6* 10.3* 12.8*  HGB 8.3* 8.6* 10.1* 10.1* 10.1*  HCT 25.9* 26.8* 30.2* 29.7* 31.4*  MCV 88.4 87.9 87.3 85.8 89.0  PLT 369 385 315 324 283    Cardiac Enzymes: No results for input(s): CKTOTAL, CKMB, CKMBINDEX, TROPONINI in the last 168 hours. CBG: Recent Labs  Lab 01/16/21 1530 01/16/21 1909 01/16/21 2300 01/17/21 0252 01/17/21 0754  GLUCAP 170* 159* 218* 162* 157*     Iron Studies: No results for input(s): IRON, TIBC, TRANSFERRIN, FERRITIN in the last 72 hours. Studies/Results: No results found.  Medications: Infusions:  sodium chloride Stopped (12/20/20 0955)   anticoagulant sodium citrate     cefTAZidime (FORTAZ)  IV 2 g (01/15/21 1326)  feeding supplement (JEVITY 1.5 CAL/FIBER) 1,000 mL (01/16/21 2206)    Scheduled Medications:  acetaminophen  1,000 mg Per Tube Q8H   amLODipine  5 mg Per Tube Daily   apixaban  5 mg Per Tube BID   vitamin C  500 mg Per Tube TID   atorvastatin  20 mg Per Tube Daily   chlorhexidine gluconate (MEDLINE KIT)  15 mL Mouth Rinse BID   Chlorhexidine Gluconate Cloth  6 each Topical Q0600   Chlorhexidine Gluconate Cloth  6 each Topical Q0600   clonazepam  0.5 mg Per Tube BID   feeding supplement (PROSource TF)  45 mL Per Tube BID   fiber  1 packet Per Tube BID   fluconazole  50 mg Per Tube Daily   gabapentin  100 mg Per  Tube Q8H   hydrocerin   Topical BID   insulin aspart  0-15 Units Subcutaneous Q4H   insulin detemir  15 Units Subcutaneous BID   levETIRAcetam  500 mg Per Tube BID   loperamide HCl  1 mg Per Tube BID   mouth rinse  15 mL Mouth Rinse 10 times per day   multivitamin  1 tablet Per Tube QHS   nystatin  5 mL Per Tube QID   QUEtiapine  50 mg Per Tube BID   sevelamer carbonate  2.4 g Per Tube TID WC   thiamine  100 mg Per Tube Daily    have reviewed scheduled and prn medications.  Physical Exam: General: Not in distress, tracheostomy on trach collar. Heart:RRR, s1s2 nl Lungs: Coarse breath sounds anteriorly Abdomen:soft, Non-tender, non-distended Extremities: No edema. Neurology: Alert awake and following commands. Dialysis Access: [facility] in place.   Cong Hightower Prasad Hilmer Aliberti 01/17/2021,8:53 AM  LOS: 47 days

## 2021-01-17 NOTE — Progress Notes (Signed)
PROGRESS NOTE    Thomas [name]  XYB:[id number] DOB: 11/21/1955 DOA: 11/30/2020 PCP: [name], NP  Brief Narrative:  The patient is a 65 year old obese African-American male with a past medical history significant for but not limited to colon cancer status post resection and colostomy in May 2022 status post 3 cycles of chemotherapy with the last 1 being in July 2022, type 2 diabetes mellitus with neuropathy, hypertension, hyperlipidemia as well as other comorbidities who presented to the ED on 11/30/2020 with abdominal pain and was found to be febrile, tachycardic and tachypneic.  Lactic acid level at that time was 6.6 and persisted to be 6.8 despite sepsis fluid bolus and cefepime.  He was found to have acute renal failure, demand ischemia bandemia, hyperglycemic crisis with negative ketones, and lactic acidosis.  He subsequently developed A. fib with RVR in the ED and was started on diltiazem.  He was admitted to ICU for septic shock with unclear source.  Sepsis was subsequently felt to be ruled out at that time of admission and presentation was felt more likely to be related to Cepacitabine toxicity with oral mucositis, hand-foot syndrome and diarrhea with high port output colostomy.  He was transferred to the hospital service on 12/03/2020 but then developed metabolic encephalopathy with hypoxia due to aspiration pneumonia on 12/06/2020 requiring intubation and pressor support.  He subsequently failed extubation with inability to clear secretions and required reintubation 12/15/2020 and a tracheostomy was placed on 811.  This was pulled on 822 and replaced under bronc guidance again and removed on 01/01/2021 requiring intubation after failed reconstruction.  ENT revise a tracheostomy in the OR on 01/05/2021.  He has had intermittent hemodialysis for acute renal failure and also had complications of right leg DVT now on Eliquis as well as colitis that thought to be ischemic on 12/09/2020 not require any  specific intervention.  Subsequently he developed a Pseudomonas pneumonia completed treatment and he started having agitated encephalopathy which is improved now.  He is status post 4 units PRBCs total and thrombocytopenia which is resolved.  On 01/11/2021 he was transferred back to the hospital service on trach collar and his tracheostomy was changed by ENT on 01/16/2021 to a #6 uncuffed XLT.  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  He is status post [facility] placement on 12/22/2020.  He was placed back on antibiotics on 01/15/2021 given worsening WBCs and concern for UTI versus tracheobronchitis.   Assessment & Plan:   Principal Problem:   Diabetes mellitus with hyperosmolarity without hyperglycemic hyperosmolar nonketotic coma (HCC) Active Problems:   Lactic acidosis   Acute renal failure superimposed on stage 2 chronic kidney disease (HCC)   Metabolic acidosis   Tachycardia   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 (HCC)   Adverse effect of chemotherapy   Pressure injury of skin   Atrial fibrillation with RVR (HCC)   Sepsis (HCC)   Malnutrition of moderate degree   Acute metabolic encephalopathy   Acute respiratory failure with hypoxemia (HCC)   Acute renal failure with acute cortical necrosis (HCC)   Shock ([place])   Status post tracheostomy ([place])   cute hypoxic respiratory failure s/p tracheostomy due to aspiration pneumonia  Recent Pseudomonas pneumonia with apparent recurrent tracheobronchitis -Symptoms consistent with tracheobronchitis likely secondary to recurrent Pseudomonas -WBC: 12.8 > 14.2 > 15.1 and further trended up to 17.5 but now is improving and is 14.5; CRP: 1.7 > 1.2 > 0.9;  PCT: 1.14 > 0.98 > 1.64 -Tracheal aspirate culture positive for GNR's; UA with multiple species. -Continue IV ceftazidime with pharmacy dosing till final cultures resulted; repeat tracheal aspirate showing moderate Pseudomonas  aeruginosa with susceptibilities to follow -ENT plans to change to cuffless trach to facilitate PMV training and this was done on 01/16/2021 -Patient was having neck pain and excessive tracheal and peritracheal secretions so ENT may need to write additional wound care orders after trach changed and insertion site can be better examined but he says that his neck pain is improved now.  If he continues to persist will need neck imaging   Acute renal failure on stage IIIb CKD: Presumed ischemic ATN -started CRRT 7/31 > iHD 8/12 s/p left IJ [facility] 8/15.   - Continue HD TTS thru L IJ TDC per nephrology.  Patient needs to tolerate sitting up in chair for dialysis treatments -Continue OOB to chair TID -Not expected to recover renal function therefore will be on permanent HD -Patient's BUNs/creatinine is morning was 66/4.09 -Undergoing dialysis again later this evening   High output ostomy, diarrhea:  -Negative C. diff PCR (7/26) and GI pathogen panel (8/1). Possibly ischemic colitis on CT 8/2-need to maintain blood pressure during dialysis treatment -Continue scheduled Imodium and continue fiber per tube -Will DC PPI twice daily since this class of drug can promote diarrhea   Anemia of critical illness, acute blood loss as well as anemia of chronic disease and iron deficiency:  - s/p 4u PRBCs this hospitalization  -Getting IV iron TTS x8 doses per nephrology. -Current hemoglobin stable but slightly trending down.  Hemoglobin/hematocrit went from 10.1/31.4 is now 9.1/27.7 -Continue to monitor for signs and symptoms of bleeding; currently no overt bleeding noted   Stage IIIb colon CA s/p partial transverse colectomy with end colostomy May 2022:  -Followed by Dr. Benay Spice. Started CAPOX June 2022, reduced cepecitabine dose starting 11/19/2020 due to hand/foot syndrome, reduced oxaliplatin due to renal impairment and poor performance status.  - Dr. Benay Spice has followed during this hospitalization. Per note  8/2 "[pt] is undergoing curative therapy for colon cancer..." - Palliative care has been involved during this hospitalization, though goals remain clear: full aggressive measures desired.    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  Continue Oxy IR to 7.5 mg.  OT/PT note that if patient continues to make progress likely would be a candidate for CIR   Acute extensive right leg DVT:  Involving CFV, SF junction, proximal femoral and profunda veins and popliteal veins. Dx 8/5.  Did not have significant edema therefore did not require thrombolysis - Continue apixaban   New onset atrial fibrillation with RVR status postconversion to normal sinus rhythm -Currently maintaining sinus rhythm w/o meds -Continue telemetry monitoring and continue anticoagulation  with apixaban  Thrush/oral candidiasis -Started nystatin 500,000 units 4 times daily and also started Diflucan 100 mg per tube once and then 50 mg per tube daily for 6 additional doses  Seizure disorder:  -Continue levetiracetam 500 g per tube twice daily -Continue seizure precautions   Hand/foot syndrome related to xeloda:  -Dose reduced  in the outpatient setting and given acuity of current symptoms this medication has not been utilized during hospitalization - No infection noted at this time. Will continue moisturizing topical Tx   Acute metabolic encephalopathy, agitated delirium:  -Resolved - Continue clonazepam 0.8m per tube BID and prn-taper and DC as tolerated - Continue quetiapine 577mBID-taper and DC as tolerated   Uncontrolled T2DM on OHAs prior to admission with associated neuropathy:  HbA1c 8.7% at admission. - CBGs well-controlled currently and ranging from 150-218, continue levemir 15u BID and SSI q4h.  - Continue gabapentin 10084m8h.   HTN:  -Continue amlodipine -Continue monitor blood pressures per protocol -Last blood pressure reading was 113/82   HLD:  -Continue Atorvastatin 20 mg per tube daily   Thrombocytopenia -Transient likely due to critical illness. HIT Ab negative on 8/5.  -Platelet count is recovered and was 282 on last check   Stress ulcer prophylaxis -PPI has been discontinued as above but may consider adding H2 blocker   ?Sepsis - initially thought to be present on admission- has been ruled out.   Obesity -Complicates overall prognosis and care -Estimated body mass index is 33.62 kg/m as calculated from the following:   Height as of this encounter: 5' 8"  (1.727 m).   Weight as of this encounter: 100.3 kg. -Weight Loss and Dietary Counseling given   DVT prophylaxis: Anticoagulated with apixaban Code Status: FULL CODE  Family Communication: No family presently at bedside Disposition Plan: Pending further clinical improvement  Status is: Inpatient  Remains inpatient appropriate because:Unsafe d/c plan, IV treatments appropriate due to intensity of illness or inability to take PO, and Inpatient level of care appropriate due to severity of illness  Dispo: The patient is from: Home              Anticipated d/c is to: SNF              Patient currently is not medically stable  to d/c.   Difficult to place patient No  Consultants:  ENT PCCM Nephrology Hematology/oncology General surgery Palliative care  Procedures:  2u PRBC 7/31, 1u PRBC 8/17, 1u PRBC 8/27 Tracheostomy revision 01/05/2021 Dr. RosConstance Holsterntimicrobials: Anti-infectives (From admission, onward)    Start     Dose/Rate Route Frequency Ordered Stop   01/17/21 1000  fluconazole (DIFLUCAN) tablet 50 mg       See Hyperspace for full Linked Orders Report.   50 mg Per Tube Daily 01/16/21 1752 01/23/21 0959   01/16/21 1845  fluconazole (DIFLUCAN) tablet 100 mg       See Hyperspace for full Linked Orders Report.   100 mg Per Tube  Once 01/16/21 1752 01/16/21 1816   01/15/21 1215  cefTAZidime (FORTAZ) 2 g in sodium chloride 0.9 % 100 mL IVPB        2 g 200 mL/hr over 30 Minutes Intravenous Every T-Th-Sa (Hemodialysis) 01/15/21 1207     01/14/21 1130  cefTRIAXone (ROCEPHIN) 1 g in sodium chloride 0.9 % 100 mL IVPB  Status:  Discontinued        1 g 200 mL/hr over 30 Minutes Intravenous Every 24 hours 01/14/21 1030 01/15/21  1052   12/24/20 1730  ceFEPIme (MAXIPIME) 1 g in sodium chloride 0.9 % 100 mL IVPB        1 g 200 mL/hr over 30 Minutes Intravenous Every 24 hours 12/24/20 1642 12/29/20 0901   12/23/20 0845  cefTRIAXone (ROCEPHIN) 2 g in sodium chloride 0.9 % 100 mL IVPB  Status:  Discontinued        2 g 200 mL/hr over 30 Minutes Intravenous Every 24 hours 12/23/20 0800 12/24/20 1620   12/22/20 0600  ceFAZolin (ANCEF) IVPB 2g/100 mL premix        2 g 200 mL/hr over 30 Minutes Intravenous To Radiology 12/21/20 1503 12/22/20 1149   12/09/20 1800  vancomycin (VANCOREADY) IVPB 1750 mg/350 mL  Status:  Discontinued        1,750 mg 175 mL/hr over 120 Minutes Intravenous Every 24 hours 12/08/20 1357 12/09/20 1403   12/09/20 1700  anidulafungin (ERAXIS) 100 mg in sodium chloride 0.9 % 100 mL IVPB  Status:  Discontinued        100 mg 78 mL/hr over 100 Minutes Intravenous Every 24 hours 12/08/20 1359  12/09/20 1321   12/08/20 1500  vancomycin (VANCOCIN) 2,500 mg in sodium chloride 0.9 % 500 mL IVPB        2,500 mg 250 mL/hr over 120 Minutes Intravenous  Once 12/08/20 1357 12/08/20 1719   12/08/20 1500  anidulafungin (ERAXIS) 200 mg in sodium chloride 0.9 % 200 mL IVPB        200 mg 78 mL/hr over 200 Minutes Intravenous  Once 12/08/20 1359 12/08/20 1821   12/08/20 0000  ceFEPIme (MAXIPIME) 2 g in sodium chloride 0.9 % 100 mL IVPB  Status:  Discontinued        2 g 200 mL/hr over 30 Minutes Intravenous Every 12 hours 12/07/20 1200 12/14/20 0858   12/07/20 1000  metroNIDAZOLE (FLAGYL) IVPB 500 mg  Status:  Discontinued        500 mg 100 mL/hr over 60 Minutes Intravenous Every 12 hours 12/07/20 0849 12/14/20 0858   12/07/20 0900  ceFEPIme (MAXIPIME) 2 g in sodium chloride 0.9 % 100 mL IVPB  Status:  Discontinued        2 g 200 mL/hr over 30 Minutes Intravenous Every 24 hours 12/07/20 0809 12/07/20 1200   12/07/20 0830  metroNIDAZOLE (FLAGYL) IVPB 500 mg  Status:  Discontinued        500 mg 100 mL/hr over 60 Minutes Intravenous Every 8 hours 12/07/20 0758 12/07/20 0849   12/06/20 0900  Ampicillin-Sulbactam (UNASYN) 3 g in sodium chloride 0.9 % 100 mL IVPB  Status:  Discontinued        3 g 200 mL/hr over 30 Minutes Intravenous Every 12 hours 12/06/20 0832 12/07/20 0757   12/03/20 0800  ceFEPIme (MAXIPIME) 2 g in sodium chloride 0.9 % 100 mL IVPB  Status:  Discontinued        2 g 200 mL/hr over 30 Minutes Intravenous Every 8 hours 12/03/20 0746 12/04/20 1004   12/01/20 1000  ceFEPIme (MAXIPIME) 2 g in sodium chloride 0.9 % 100 mL IVPB  Status:  Discontinued        2 g 200 mL/hr over 30 Minutes Intravenous Every 12 hours 12/01/20 0513 12/03/20 0746   12/01/20 0300  vancomycin (VANCOREADY) IVPB 2000 mg/400 mL        2,000 mg 200 mL/hr over 120 Minutes Intravenous  Once 12/01/20 0255 12/01/20 0528   11/30/20 2245  ceFEPIme (MAXIPIME) 2 g  in sodium chloride 0.9 % 100 mL IVPB        2 g 200  mL/hr over 30 Minutes Intravenous STAT 11/30/20 2237 12/01/20 0003        Subjective: Seen and examined at bedside and states that he needed his meds.  Have a difficult time speaking without his PMV but was able to communicate by writing.  No chest pain or shortness of breath.  Felt okay.  No other concerns or complaints at this time.  Objective: Vitals:   01/17/21 1530 01/17/21 1600 01/17/21 1630 01/17/21 1700  BP: (!) 108/96 121/80 113/82   Pulse:      Resp:      Temp:    98.5 F (36.9 C)  TempSrc:    Oral  SpO2:      Weight:    100.3 kg  Height:        Intake/Output Summary (Last 24 hours) at 01/17/2021 1815 Last data filed at 01/17/2021 1700 Gross per 24 hour  Intake 520 ml  Output 1850 ml  Net -1330 ml   Filed Weights   01/16/21 0500 01/17/21 1350 01/17/21 1700  Weight: 101.4 kg 102.2 kg 100.3 kg   Examination: Physical Exam:  Constitutional: WN/WD obese chronically ill-appearing African-American male currently in NAD and a little anxious Eyes: Lids and conjunctivae normal, sclerae anicteric  ENMT: External Ears, Nose appear normal. Grossly normal hearing.  Neck: Appears normal, supple, no cervical masses, normal ROM, no appreciable thyromegaly; has a tracheostomy in place and connected to ATC Respiratory: Diminished to auscultation bilaterally with coarse breath sounds, no wheezing, rales, rhonchi or crackles. Normal respiratory effort and patient is not tachypenic. No accessory muscle use.  Unlabored breathing Cardiovascular: RRR, no murmurs / rubs / gallops. S1 and S2 auscultated.  Trace extremity edema Abdomen: Soft, non-tender, distended secondary body habitus.  PEG is in place. bowel sounds positive.  GU: Deferred. Musculoskeletal: No clubbing / cyanosis of digits/nails. No joint deformity upper and lower extremities.  Skin: No rashes, lesions, ulcers on limited skin evaluation. No induration; Warm and dry.  Neurologic: CN 2-12 grossly intact with no focal  deficits. Romberg sign and cerebellar reflexes not assessed.  Psychiatric: Normal judgment and insight. Alert and oriented x 3. Normal mood and appropriate affect.   Data Reviewed: I have personally reviewed following labs and imaging studies  CBC: Recent Labs  Lab 01/13/21 0648 01/14/21 0906 01/15/21 0400 01/16/21 0041 01/17/21 0845  WBC 12.8* 14.2* 15.1* 17.5* 14.5*  NEUTROABS  --  9.6* 10.3* 12.8* 10.1*  HGB 8.6* 10.1* 10.1* 10.1* 9.1*  HCT 26.8* 30.2* 29.7* 31.4* 27.7*  MCV 87.9 87.3 85.8 89.0 87.7  PLT 385 315 324 283 161   Basic Metabolic Panel: Recent Labs  Lab 01/13/21 0149 01/14/21 0135 01/14/21 0906 01/15/21 0400 01/16/21 0039 01/17/21 0500 01/17/21 0845  NA 136 135  --  134* 134* 132*  --   K 4.4 4.4  --  4.6 4.5 5.0  --   CL 99 96*  --  92* 93* 93*  --   CO2 22 23  --  22 23 22   --   GLUCOSE 167* 168*  --  164* 177* 178*  --   BUN 66* 38*  --  64* 36* 66*  --   CREATININE 3.81* 2.88*  --  3.75* 2.88* 4.09*  --   CALCIUM 9.3 9.5  --  10.1 9.5 9.6  --   MG  --   --  1.9  --   --   --  2.1  PHOS 6.2* 5.1* 5.8* 7.4* 5.2* 8.0*  --    GFR: Estimated Creatinine Clearance: 21 mL/min (A) (by C-G formula based on SCr of 4.09 mg/dL (H)). Liver Function Tests: Recent Labs  Lab 01/14/21 0135 01/15/21 0400 01/16/21 0039 01/17/21 0500 01/17/21 0845  AST  --   --   --   --  12*  ALT  --   --   --   --  16  ALKPHOS  --   --   --   --  103  BILITOT  --   --   --   --  0.8  PROT  --   --   --   --  8.2*  ALBUMIN 2.8* 2.8* 2.7* 2.7* 2.7*   No results for input(s): LIPASE, AMYLASE in the last 168 hours. No results for input(s): AMMONIA in the last 168 hours. Coagulation Profile: No results for input(s): INR, PROTIME in the last 168 hours. Cardiac Enzymes: No results for input(s): CKTOTAL, CKMB, CKMBINDEX, TROPONINI in the last 168 hours. BNP (last 3 results) No results for input(s): PROBNP in the last 8760 hours. HbA1C: No results for input(s): HGBA1C in the  last 72 hours. CBG: Recent Labs  Lab 01/16/21 1909 01/16/21 2300 01/17/21 0252 01/17/21 0754 01/17/21 1202  GLUCAP 159* 218* 162* 157* 150*   Lipid Profile: No results for input(s): CHOL, HDL, LDLCALC, TRIG, CHOLHDL, LDLDIRECT in the last 72 hours. Thyroid Function Tests: No results for input(s): TSH, T4TOTAL, FREET4, T3FREE, THYROIDAB in the last 72 hours. Anemia Panel: No results for input(s): VITAMINB12, FOLATE, FERRITIN, TIBC, IRON, RETICCTPCT in the last 72 hours. Sepsis Labs: Recent Labs  Lab 01/14/21 1121 01/15/21 0400 01/16/21 0039  PROCALCITON 1.14 0.98 1.64    Recent Results (from the past 240 hour(s))  Urine Culture     Status: Abnormal   Collection Time: 01/14/21  8:46 AM   Specimen: In/Out Cath Urine  Result Value Ref Range Status   Specimen Description IN/OUT CATH URINE  Final   Special Requests   Final    NONE Performed at [facility], [address] [place], [place] [address]    Culture MULTIPLE SPECIES PRESENT, SUGGEST RECOLLECTION (A)  Final   Report Status 01/15/2021 FINAL  Final  Culture, blood (Routine X 2) w Reflex to ID Panel     Status: None (Preliminary result)   Collection Time: 01/14/21 11:21 AM   Specimen: BLOOD  Result Value Ref Range Status   Specimen Description BLOOD RIGHT ANTECUBITAL  Final   Special Requests   Final    BOTTLES DRAWN AEROBIC ONLY Blood Culture adequate volume   Culture   Final    NO GROWTH 3 DAYS Performed at [facility], [place] [address], [place], [place] [address]    Report Status PENDING  Incomplete  Culture, blood (Routine X 2) w Reflex to ID Panel     Status: None (Preliminary result)   Collection Time: 01/14/21 11:22 AM   Specimen: BLOOD  Result Value Ref Range Status   Specimen Description BLOOD LEFT ANTECUBITAL  Final   Special Requests   Final    BOTTLES DRAWN AEROBIC ONLY Blood Culture adequate volume   Culture   Final    NO GROWTH 3 DAYS Performed at [facility], [place]  [address], [place], [place] [address]    Report Status PENDING  Incomplete  Culture, Respiratory w Gram Stain     Status: None (Preliminary result)  Collection Time: 01/14/21 11:40 AM   Specimen: Tracheal Aspirate; Respiratory  Result Value Ref Range Status   Specimen Description TRACHEAL ASPIRATE  Final   Special Requests NONE  Final   Gram Stain   Final    ABUNDANT WBC PRESENT,BOTH PMN AND MONONUCLEAR FEW SQUAMOUS EPITHELIAL CELLS PRESENT MODERATE GRAM NEGATIVE RODS FEW GRAM VARIABLE ROD    Culture   Final    MODERATE PSEUDOMONAS AERUGINOSA SUSCEPTIBILITIES TO FOLLOW Performed at [facility], [place] [address] [place], [place] [address]    Report Status PENDING  Incomplete     RN Pressure Injury Documentation: Pressure Injury 12/14/20 Face Upper;Mid Deep Tissue Pressure Injury - Purple or maroon localized area of discolored intact skin or blood-filled blister due to damage of underlying soft tissue from pressure and/or shear. circular shape broken area & sloug (Active)  12/14/20 0100  Location: Face  Location Orientation: Upper;Mid  Staging: Deep Tissue Pressure Injury - Purple or maroon localized area of discolored intact skin or blood-filled blister due to damage of underlying soft tissue from pressure and/or shear.  Wound Description (Comments): circular shape broken area & sloughing skin  Present on Admission: No     Pressure Injury 12/14/20 Face Left;Upper Deep Tissue Pressure Injury - Purple or maroon localized area of discolored intact skin or blood-filled blister due to damage of underlying soft tissue from pressure and/or shear. circular shape broken area & slou (Active)  12/14/20 0100  Location: Face  Location Orientation: Left;Upper  Staging: Deep Tissue Pressure Injury - Purple or maroon localized area of discolored intact skin or blood-filled blister due to damage of underlying soft tissue from pressure and/or shear.  Wound Description (Comments): circular shape  broken area & sloughing skin  Present on Admission: No     Pressure Injury 12/23/20 Buttocks Left Stage 2 -  Partial thickness loss of dermis presenting as a shallow open injury with a red, pink wound bed without slough. left buttock stage 2 (Active)  12/23/20   Location: Buttocks  Location Orientation: Left  Staging: Stage 2 -  Partial thickness loss of dermis presenting as a shallow open injury with a red, pink wound bed without slough.  Wound Description (Comments): left buttock stage 2  Present on Admission: Yes    Estimated body mass index is 33.62 kg/m as calculated from the following:   Height as of this encounter: 5' 8"  (1.727 m).   Weight as of this encounter: 100.3 kg.  Malnutrition Type:  Nutrition Problem: Moderate Malnutrition Etiology: chronic illness, cancer and cancer related treatments  Malnutrition Characteristics:  Signs/Symptoms: mild fat depletion, mild muscle depletion, percent weight loss Percent weight loss: 11 %  Nutrition Interventions:  Interventions: Prostat, Tube feeding, MVI   Radiology Studies: No results found.  Scheduled Meds:  acetaminophen  1,000 mg Per Tube Q8H   amLODipine  5 mg Per Tube Daily   apixaban  5 mg Per Tube BID   vitamin C  500 mg Per Tube TID   atorvastatin  20 mg Per Tube Daily   chlorhexidine gluconate (MEDLINE KIT)  15 mL Mouth Rinse BID   Chlorhexidine Gluconate Cloth  6 each Topical Q0600   Chlorhexidine Gluconate Cloth  6 each Topical Q0600   clonazepam  0.5 mg Per Tube BID   feeding supplement (PROSource TF)  45 mL Per Tube BID   fiber  1 packet Per Tube BID   fluconazole  50 mg Per Tube Daily   gabapentin  100 mg Per  Tube Q8H   heparin sodium (porcine)       hydrocerin   Topical BID   insulin aspart  0-15 Units Subcutaneous Q4H   insulin detemir  15 Units Subcutaneous BID   levETIRAcetam  500 mg Per Tube BID   loperamide HCl  1 mg Per Tube BID   mouth rinse  15 mL Mouth Rinse 10 times per day    multivitamin  1 tablet Per Tube QHS   nystatin  5 mL Per Tube QID   QUEtiapine  50 mg Per Tube BID   sevelamer carbonate  2.4 g Per Tube TID WC   thiamine  100 mg Per Tube Daily   Continuous Infusions:  sodium chloride Stopped (12/20/20 0955)   anticoagulant sodium citrate     cefTAZidime (FORTAZ)  IV 2 g (01/15/21 1326)   feeding supplement (JEVITY 1.5 CAL/FIBER) 1,000 mL (01/16/21 2206)    LOS: 68 days   Kerney Elbe, DO [facility] PAGER is on AMION  If 7PM-7AM, please contact night-coverage [email or web address]

## 2021-01-18 ENCOUNTER — Inpatient Hospital Stay (HOSPITAL_COMMUNITY): Payer: Medicare HMO

## 2021-01-18 DIAGNOSIS — G9341 Metabolic encephalopathy: Secondary | ICD-10-CM | POA: Diagnosis not present

## 2021-01-18 DIAGNOSIS — E1100 Type 2 diabetes mellitus with hyperosmolarity without nonketotic hyperglycemic-hyperosmolar coma (NKHHC): Secondary | ICD-10-CM | POA: Diagnosis not present

## 2021-01-18 DIAGNOSIS — N170 Acute kidney failure with tubular necrosis: Secondary | ICD-10-CM | POA: Diagnosis not present

## 2021-01-18 DIAGNOSIS — N171 Acute kidney failure with acute cortical necrosis: Secondary | ICD-10-CM | POA: Diagnosis not present

## 2021-01-18 LAB — CBC WITH DIFFERENTIAL/PLATELET
Abs Immature Granulocytes: 0.28 10*3/uL — ABNORMAL HIGH (ref 0.00–0.07)
Basophils Absolute: 0.2 10*3/uL — ABNORMAL HIGH (ref 0.0–0.1)
Basophils Relative: 1 %
Eosinophils Absolute: 0.6 10*3/uL — ABNORMAL HIGH (ref 0.0–0.5)
Eosinophils Relative: 4 %
HCT: 29.3 % — ABNORMAL LOW (ref 39.0–52.0)
Hemoglobin: 9.8 g/dL — ABNORMAL LOW (ref 13.0–17.0)
Immature Granulocytes: 2 %
Lymphocytes Relative: 13 %
Lymphs Abs: 1.9 10*3/uL (ref 0.7–4.0)
MCH: 29 pg (ref 26.0–34.0)
MCHC: 33.4 g/dL (ref 30.0–36.0)
MCV: 86.7 fL (ref 80.0–100.0)
Monocytes Absolute: 2 10*3/uL — ABNORMAL HIGH (ref 0.1–1.0)
Monocytes Relative: 13 %
Neutro Abs: 10.4 10*3/uL — ABNORMAL HIGH (ref 1.7–7.7)
Neutrophils Relative %: 67 %
Platelets: 261 10*3/uL (ref 150–400)
RBC: 3.38 MIL/uL — ABNORMAL LOW (ref 4.22–5.81)
RDW: 15.9 % — ABNORMAL HIGH (ref 11.5–15.5)
WBC: 15.4 10*3/uL — ABNORMAL HIGH (ref 4.0–10.5)
nRBC: 0 % (ref 0.0–0.2)

## 2021-01-18 LAB — GLUCOSE, CAPILLARY
Glucose-Capillary: 173 mg/dL — ABNORMAL HIGH (ref 70–99)
Glucose-Capillary: 173 mg/dL — ABNORMAL HIGH (ref 70–99)
Glucose-Capillary: 177 mg/dL — ABNORMAL HIGH (ref 70–99)
Glucose-Capillary: 181 mg/dL — ABNORMAL HIGH (ref 70–99)
Glucose-Capillary: 188 mg/dL — ABNORMAL HIGH (ref 70–99)
Glucose-Capillary: 193 mg/dL — ABNORMAL HIGH (ref 70–99)
Glucose-Capillary: 208 mg/dL — ABNORMAL HIGH (ref 70–99)

## 2021-01-18 LAB — CULTURE, RESPIRATORY W GRAM STAIN

## 2021-01-18 LAB — COMPREHENSIVE METABOLIC PANEL
ALT: 16 U/L (ref 0–44)
AST: 16 U/L (ref 15–41)
Albumin: 2.8 g/dL — ABNORMAL LOW (ref 3.5–5.0)
Alkaline Phosphatase: 117 U/L (ref 38–126)
Anion gap: 20 — ABNORMAL HIGH (ref 5–15)
BUN: 38 mg/dL — ABNORMAL HIGH (ref 8–23)
CO2: 24 mmol/L (ref 22–32)
Calcium: 9.2 mg/dL (ref 8.9–10.3)
Chloride: 90 mmol/L — ABNORMAL LOW (ref 98–111)
Creatinine, Ser: 2.89 mg/dL — ABNORMAL HIGH (ref 0.61–1.24)
GFR, Estimated: 24 mL/min — ABNORMAL LOW (ref >60)
Glucose, Bld: 169 mg/dL — ABNORMAL HIGH (ref 70–99)
Potassium: 4.4 mmol/L (ref 3.5–5.1)
Sodium: 134 mmol/L — ABNORMAL LOW (ref 135–145)
Total Bilirubin: 0.9 mg/dL (ref 0.3–1.2)
Total Protein: 8.3 g/dL — ABNORMAL HIGH (ref 6.5–8.1)

## 2021-01-18 LAB — MAGNESIUM: Magnesium: 2.1 mg/dL (ref 1.7–2.4)

## 2021-01-18 LAB — PHOSPHORUS: Phosphorus: 5.8 mg/dL — ABNORMAL HIGH (ref 2.5–4.6)

## 2021-01-18 NOTE — Progress Notes (Signed)
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 CRRT initiated 7/31 and transitioned to IHD on 12/19/20.  S/p [facility] placement 12/22/20.  Continue with TTS schedule for now.   No sign of renal recovery although he has some urine output.  Status post HD yesterday with 1.6 L UF, tolerated well.  Plan for next HD on 9/13.  # Volume overload/anasarca-improved with dialysis.  # Sepsis/aspiration pneumonia/VDRF:aspiration pneumonia.  Completed antibiotics course.  S/p tracheostomy, however pt removed it on 01/01/21 s/p intubation.  New tracheostomy created 01/05/21 by ENT.   # Stage IIIB colon cancer - sp partial colectomy, colostomy. On chemoRx sp 3 cycles.  # Right leg DVT: On Eliquis.  # Atrial fibrillation - on amiodarone and anticoagulation.    # Anemia of critical illness: DC  IV iron as concern for infection and on abx, transfuse as needed, holding erythropoietin with active malignancy.  Monitor hemoglobin and transfuse as needed.  #CKD-MBD; started sevelamer for hyperphosphatemia on 9/8, monitor phosphorus level.  #Disposition - poor overall prognosis.  Seen by palliative care team.  Discussion ongoing for possible transfer to [facility].  The tracheostomy should be capped before he can be accepted to [facility]  Subjective: Seen and examined.  No new event.  Alert awake and following commands. Objective Vital signs in last 24 hours: Vitals:   01/18/21 0500 01/18/21 0815 01/18/21 0818 01/18/21 0835  BP:  128/76    Pulse:  (!) 101    Resp:  17    Temp:  99 F (37.2 C) 99 F (37.2 C)   TempSrc:  Axillary Axillary   SpO2:  98%  99%  Weight: 101.2 kg     Height:       Weight change:   Intake/Output Summary (Last 24 hours) at 01/18/2021 0950 Last data filed at 01/18/2021 0500 Gross per 24 hour  Intake 520 ml  Output 2750 ml  Net -2230 ml         Labs: Basic Metabolic Panel: Recent Labs  Lab 01/16/21 0039 01/17/21 0500 01/18/21 0352  NA 134* 132* 134*  K 4.5 5.0 4.4  CL 93* 93* 90*  CO2 23 22 24   GLUCOSE 177* 178* 169*  BUN 36* 66* 38*  CREATININE 2.88* 4.09* 2.89*  CALCIUM 9.5 9.6 9.2  PHOS 5.2* 8.0* 5.8*    Liver Function Tests: Recent Labs  Lab 01/17/21 0500 01/17/21 0845 01/18/21 0352  AST  --  12* 16  ALT  --  16 16  ALKPHOS  --  103 117  BILITOT  --  0.8 0.9  PROT  --  8.2* 8.3*  ALBUMIN 2.7* 2.7* 2.8*    No results for input(s): LIPASE, AMYLASE in the last 168 hours. No results for input(s): AMMONIA in the last 168 hours. CBC: Recent Labs  Lab 01/14/21 0906 01/15/21 0400 01/16/21 0041 01/17/21 0845 01/18/21 0352  WBC 14.2* 15.1* 17.5* 14.5* 15.4*  NEUTROABS 9.6* 10.3* 12.8* 10.1* 10.4*  HGB 10.1* 10.1* 10.1* 9.1* 9.8*  HCT 30.2* 29.7* 31.4* 27.7* 29.3*  MCV 87.3 85.8 89.0 87.7 86.7  PLT 315 324 283 282 261    Cardiac Enzymes: No results for input(s): CKTOTAL, CKMB, CKMBINDEX, TROPONINI in the last 168 hours. CBG: Recent Labs  Lab 01/17/21 1202 01/17/21 2008 01/18/21 0048 01/18/21 0444 01/18/21 0816  GLUCAP 150* 176* 188* 181* 177*     Iron Studies:  No results for input(s): IRON, TIBC, TRANSFERRIN, FERRITIN in the last 72 hours. Studies/Results: No results found.  Medications: Infusions:  sodium chloride Stopped (12/20/20 0955)   anticoagulant sodium citrate     cefTAZidime (FORTAZ)  IV 2 g (01/15/21 1326)   feeding supplement (JEVITY 1.5 CAL/FIBER) 1,000 mL (01/18/21 0524)    Scheduled Medications:  acetaminophen  1,000 mg Per Tube Q8H   amLODipine  5 mg Per Tube Daily   apixaban  5 mg Per Tube BID   vitamin C  500 mg Per Tube TID   atorvastatin  20 mg Per Tube Daily   chlorhexidine gluconate (MEDLINE KIT)  15 mL Mouth Rinse BID   Chlorhexidine Gluconate Cloth  6 each Topical Q0600   Chlorhexidine Gluconate Cloth  6 each Topical Q0600   clonazepam  0.5  mg Per Tube BID   feeding supplement (PROSource TF)  45 mL Per Tube BID   fiber  1 packet Per Tube BID   fluconazole  50 mg Per Tube Daily   gabapentin  100 mg Per Tube Q8H   hydrocerin   Topical BID   insulin aspart  0-15 Units Subcutaneous Q4H   insulin detemir  15 Units Subcutaneous BID   levETIRAcetam  500 mg Per Tube BID   loperamide HCl  1 mg Per Tube BID   mouth rinse  15 mL Mouth Rinse 10 times per day   multivitamin  1 tablet Per Tube QHS   nystatin  5 mL Per Tube QID   QUEtiapine  50 mg Per Tube BID   sevelamer carbonate  2.4 g Per Tube TID WC   thiamine  100 mg Per Tube Daily    have reviewed scheduled and prn medications.  Physical Exam: General: Ill-looking male, with tracheostomy on trach collar, not in distress Heart:RRR, s1s2 nl Lungs: Coarse breath sounds anteriorly Abdomen:soft, Non-tender, non-distended Extremities: No edema. Neurology: Alert awake and following commands. Dialysis Access: [facility] in place.   Kinzey Sheriff Prasad Lejon Afzal 01/18/2021,9:50 AM  LOS: 48 days

## 2021-01-18 NOTE — Progress Notes (Signed)
PROGRESS NOTE    Thomas [name]  OVF:[id number] DOB: 1956/01/17 DOA: 11/30/2020 PCP: [name], NP  Brief Narrative:  The patient is a 65 year old obese African-American male with a past medical history significant for but not limited to colon cancer status post resection and colostomy in May 2022 status post 3 cycles of chemotherapy with the last 1 being in July 2022, type 2 diabetes mellitus with neuropathy, hypertension, hyperlipidemia as well as other comorbidities who presented to the ED on 11/30/2020 with abdominal pain and was found to be febrile, tachycardic and tachypneic.  Lactic acid level at that time was 6.6 and persisted to be 6.8 despite sepsis fluid bolus and cefepime.  He was found to have acute renal failure, demand ischemia bandemia, hyperglycemic crisis with negative ketones, and lactic acidosis.  He subsequently developed A. fib with RVR in the ED and was started on diltiazem.  He was admitted to ICU for septic shock with unclear source.  Sepsis was subsequently felt to be ruled out at that time of admission and presentation was felt more likely to be related to Cepacitabine toxicity with oral mucositis, hand-foot syndrome and diarrhea with high port output colostomy.  He was transferred to the hospital service on 12/03/2020 but then developed metabolic encephalopathy with hypoxia due to aspiration pneumonia on 12/06/2020 requiring intubation and pressor support.  He subsequently failed extubation with inability to clear secretions and required reintubation 12/15/2020 and a tracheostomy was placed on 811.  This was pulled on 822 and replaced under bronc guidance again and removed on 01/01/2021 requiring intubation after failed reconstruction.  ENT revise a tracheostomy in the OR on 01/05/2021.  He has had intermittent hemodialysis for acute renal failure and also had complications of right leg DVT now on Eliquis as well as colitis that thought to be ischemic on 12/09/2020 not require any  specific intervention.  Subsequently he developed a Pseudomonas pneumonia completed treatment and he started having agitated encephalopathy which is improved now.  He is status post 4 units PRBCs total and thrombocytopenia which is resolved.  On 01/11/2021 he was transferred back to the hospital service on trach collar and his tracheostomy was changed by ENT on 01/16/2021 to a #6 uncuffed XLT.  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  He is status post [facility] placement on 12/22/2020.  He was placed back on antibiotics on 01/15/2021 given worsening WBCs and concern for UTI versus tracheobronchitis. Tracheal Aspirate was growing Pseudomonas that was Pan-Sensitive so will continue Ceftazidime.    Assessment & Plan:   Principal Problem:   Diabetes mellitus with hyperosmolarity without hyperglycemic hyperosmolar nonketotic coma (HCC) Active Problems:   Lactic acidosis   Acute renal failure superimposed on stage 2 chronic kidney disease (HCC)   Metabolic acidosis   Tachycardia   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 (HCC)   Adverse effect of chemotherapy   Pressure injury of skin   Atrial fibrillation with RVR (HCC)   Sepsis (HCC)   Malnutrition of moderate degree   Acute metabolic encephalopathy   Acute respiratory failure with hypoxemia (HCC)   Acute renal failure with acute cortical necrosis (HCC)   Shock ([place])   Status post tracheostomy ([place])  Acute hypoxic respiratory failure s/p tracheostomy due to aspiration pneumonia  Recent Pseudomonas pneumonia with apparent recurrent tracheobronchitis -Symptoms consistent with tracheobronchitis likely secondary to recurrent Pseudomonas -WBC: 12.8 > 14.2 > 15.1 and further trended up to 17.5 but  now is improving and is 14.5 yesterday but slightly trended up to 15.4; CRP: 1.7 > 1.2 > 0.9; PCT: 1.14 > 0.98 > 1.64 -Tracheal aspirate culture positive for GNR's; UA with multiple  species. -Continue IV ceftazidime with pharmacy dosing till final cultures resulted; repeat tracheal aspirate showing moderate Pseudomonas aeruginosa that is PAN-Sensitive  -ENT plans to change to cuffless trach to facilitate PMV training and this was done on 01/16/2021 -Patient was having neck pain and excessive tracheal and peritracheal secretions so ENT may need to write additional wound care orders after trach changed and insertion site can be better examined but he says that his neck pain is improved now.  If he continues to persist will need neck imaging   Acute renal failure on stage IIIb CKD: Presumed ischemic ATN -started CRRT 7/31 > iHD 8/12 s/p left IJ [facility] 8/15.   - Continue HD TTS thru L IJ TDC per nephrology.  Patient needs to tolerate sitting up in chair for dialysis treatments -Continue OOB to chair TID -Not expected to recover renal function therefore will be on permanent HD -Patient's BUNs/creatinine has gone from 66/4.09 -> 38/2.89 -Undergoing dialysis again later this evening   High output ostomy, Diarrhea:  -Negative C. diff PCR (7/26) and GI pathogen panel (8/1). Possibly ischemic colitis on CT 8/2-need to maintain blood pressure during dialysis treatment -Continue scheduled Imodium and continue fiber per tube -Will DC PPI twice daily since this class of drug can promote diarrhea   Anemia of critical illness, acute blood loss as well as anemia of chronic disease and iron deficiency:  - s/p 4u PRBCs this hospitalization  -Getting IV iron TTS x8 doses per nephrology. -Current hemoglobin stable but slightly trending down.  Hemoglobin/hematocrit went from 10.1/31.4 -> 9.1/27.7 -> 9.8/29.3 -Continue to monitor for signs and symptoms of bleeding; currently no overt bleeding noted   Stage IIIb colon CA s/p partial transverse colectomy with end colostomy May 2022:  -Followed by Dr. Benay Spice. Started CAPOX June 2022, reduced cepecitabine dose starting 11/19/2020 due to hand/foot  syndrome, reduced oxaliplatin due to renal impairment and poor performance status.  - Dr. Benay Spice has followed during this hospitalization. Per note 8/2 "[pt] is undergoing curative therapy for colon cancer..." - Palliative care has been involved during this hospitalization, though goals remain clear: full aggressive measures desired.    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  Continue Oxy IR to 7.5 mg.  OT/PT note that if patient continues to make progress likely would be a candidate for CIR   Acute extensive right leg DVT:  Involving CFV, SF junction, proximal femoral and profunda veins and popliteal veins. Dx 8/5.  Did not have significant edema therefore did not require thrombolysis - Continue apixaban   New onset  atrial fibrillation with RVR status postconversion to normal sinus rhythm -Currently maintaining sinus rhythm w/o meds -Continue telemetry monitoring and continue anticoagulation with apixaban -Continue to Monitor on Telemetry   Thrush/oral candidiasis -Started nystatin 500,000 units 4 times daily and also started Diflucan 100 mg per tube once and then 50 mg per tube  daily for 6 additional doses  Seizure Disorder:  -Continue levetiracetam 500 g per tube twice daily -Continue seizure precautions   Hand/foot syndrome related to xeloda:  -Dose reduced in the outpatient setting and given acuity of current symptoms this medication has not been utilized during hospitalization - No infection noted at this time. Will continue moisturizing topical Tx   Acute metabolic encephalopathy, agitated delirium:  -Resolved - Continue clonazepam 0.76m per tube BID and prn-taper and DC as tolerated - Continue quetiapine 58mBID-taper and DC as tolerated   Uncontrolled T2DM on OHAs prior to admission with associated neuropathy:  HbA1c 8.7% at admission. - CBGs well-controlled currently and ranging from 150-218, continue levemir 15u BID and SSI q4h.  - Continue gabapentin 1002m8h.   HTN:  -Continue amlodipine -Continue monitor blood pressures per protocol -Last blood pressure reading was 130/76   HLD:  -Continue Atorvastatin 20 mg per tube daily   Thrombocytopenia -Transient likely due to critical illness. HIT Ab negative on 8/5.  -Platelet count is recovered and was 261 on last check   Stress ulcer prophylaxis -PPI has been discontinued as above but may consider adding H2 blocker   ?Sepsis - initially thought to be present on admission- has been ruled out.   Obesity -Complicates overall prognosis and care -Estimated body mass index is 33.92 kg/m as calculated from the following:   Height as of this encounter: 5' 8"  (1.727 m).   Weight as of this encounter: 101.2 kg. -Weight Loss and Dietary Counseling given   DVT prophylaxis: Anticoagulated with apixaban Code Status: FULL CODE  Family Communication: No family presently at bedside Disposition Plan: Pending further clinical improvement  Status is: Inpatient  Remains inpatient appropriate because:Unsafe d/c plan, IV treatments appropriate due to intensity of illness or inability to take PO, and  Inpatient level of care appropriate due to severity of illness  Dispo: The patient is from: Home              Anticipated d/c is to: SNF              Patient currently is not medically stable to d/c.   Difficult to place patient No  Consultants:  ENT PCCM Nephrology Hematology/oncology General surgery Palliative care  Procedures:  2u PRBC 7/31, 1u PRBC 8/17, 1u PRBC 8/27 Tracheostomy revision 01/05/2021 Dr. RosConstance Holsterntimicrobials: Anti-infectives (From admission, onward)    Start     Dose/Rate Route Frequency Ordered Stop   01/17/21 1000  fluconazole (DIFLUCAN) tablet 50 mg       See Hyperspace for full Linked Orders Report.   50 mg Per Tube Daily 01/16/21 1752 01/23/21 0959   01/16/21 1845  fluconazole (DIFLUCAN) tablet 100 mg       See Hyperspace for full Linked Orders Report.   100 mg Per Tube  Once 01/16/21 1752 01/16/21 1816   01/15/21 1215  cefTAZidime (FORTAZ) 2 g in sodium chloride 0.9 % 100 mL IVPB        2 g 200 mL/hr over 30 Minutes Intravenous Every T-Th-Sa (Hemodialysis) 01/15/21 1207     01/14/21 1130  cefTRIAXone (ROCEPHIN) 1 g in sodium chloride 0.9 %  100 mL IVPB  Status:  Discontinued        1 g 200 mL/hr over 30 Minutes Intravenous Every 24 hours 01/14/21 1030 01/15/21 1052   12/24/20 1730  ceFEPIme (MAXIPIME) 1 g in sodium chloride 0.9 % 100 mL IVPB        1 g 200 mL/hr over 30 Minutes Intravenous Every 24 hours 12/24/20 1642 12/29/20 0901   12/23/20 0845  cefTRIAXone (ROCEPHIN) 2 g in sodium chloride 0.9 % 100 mL IVPB  Status:  Discontinued        2 g 200 mL/hr over 30 Minutes Intravenous Every 24 hours 12/23/20 0800 12/24/20 1620   12/22/20 0600  ceFAZolin (ANCEF) IVPB 2g/100 mL premix        2 g 200 mL/hr over 30 Minutes Intravenous To Radiology 12/21/20 1503 12/22/20 1149   12/09/20 1800  vancomycin (VANCOREADY) IVPB 1750 mg/350 mL  Status:  Discontinued        1,750 mg 175 mL/hr over 120 Minutes Intravenous Every 24 hours 12/08/20 1357 12/09/20 1403    12/09/20 1700  anidulafungin (ERAXIS) 100 mg in sodium chloride 0.9 % 100 mL IVPB  Status:  Discontinued        100 mg 78 mL/hr over 100 Minutes Intravenous Every 24 hours 12/08/20 1359 12/09/20 1321   12/08/20 1500  vancomycin (VANCOCIN) 2,500 mg in sodium chloride 0.9 % 500 mL IVPB        2,500 mg 250 mL/hr over 120 Minutes Intravenous  Once 12/08/20 1357 12/08/20 1719   12/08/20 1500  anidulafungin (ERAXIS) 200 mg in sodium chloride 0.9 % 200 mL IVPB        200 mg 78 mL/hr over 200 Minutes Intravenous  Once 12/08/20 1359 12/08/20 1821   12/08/20 0000  ceFEPIme (MAXIPIME) 2 g in sodium chloride 0.9 % 100 mL IVPB  Status:  Discontinued        2 g 200 mL/hr over 30 Minutes Intravenous Every 12 hours 12/07/20 1200 12/14/20 0858   12/07/20 1000  metroNIDAZOLE (FLAGYL) IVPB 500 mg  Status:  Discontinued        500 mg 100 mL/hr over 60 Minutes Intravenous Every 12 hours 12/07/20 0849 12/14/20 0858   12/07/20 0900  ceFEPIme (MAXIPIME) 2 g in sodium chloride 0.9 % 100 mL IVPB  Status:  Discontinued        2 g 200 mL/hr over 30 Minutes Intravenous Every 24 hours 12/07/20 0809 12/07/20 1200   12/07/20 0830  metroNIDAZOLE (FLAGYL) IVPB 500 mg  Status:  Discontinued        500 mg 100 mL/hr over 60 Minutes Intravenous Every 8 hours 12/07/20 0758 12/07/20 0849   12/06/20 0900  Ampicillin-Sulbactam (UNASYN) 3 g in sodium chloride 0.9 % 100 mL IVPB  Status:  Discontinued        3 g 200 mL/hr over 30 Minutes Intravenous Every 12 hours 12/06/20 0832 12/07/20 0757   12/03/20 0800  ceFEPIme (MAXIPIME) 2 g in sodium chloride 0.9 % 100 mL IVPB  Status:  Discontinued        2 g 200 mL/hr over 30 Minutes Intravenous Every 8 hours 12/03/20 0746 12/04/20 1004   12/01/20 1000  ceFEPIme (MAXIPIME) 2 g in sodium chloride 0.9 % 100 mL IVPB  Status:  Discontinued        2 g 200 mL/hr over 30 Minutes Intravenous Every 12 hours 12/01/20 0513 12/03/20 0746   12/01/20 0300  vancomycin (VANCOREADY) IVPB 2000 mg/400  mL  2,000 mg 200 mL/hr over 120 Minutes Intravenous  Once 12/01/20 0255 12/01/20 0528   11/30/20 2245  ceFEPIme (MAXIPIME) 2 g in sodium chloride 0.9 % 100 mL IVPB        2 g 200 mL/hr over 30 Minutes Intravenous STAT 11/30/20 2237 12/01/20 0003        Subjective: Seen and examined at bedside and states that he was doing okay. Tolerated dialysis yesterday without issues.  WBC still little elevated but tracheal aspirate is pansensitive to ceftazidime.  He denies any concerns or complaints at this time.   Objective: Vitals:   01/18/21 0835 01/18/21 1200 01/18/21 1215 01/18/21 1230  BP:  129/78 130/76   Pulse:   (!) 103   Resp:  17 17   Temp:   98.5 F (36.9 C)   TempSrc:   Axillary   SpO2: 99% 99% 99% 99%  Weight:      Height:        Intake/Output Summary (Last 24 hours) at 01/18/2021 1425 Last data filed at 01/18/2021 0500 Gross per 24 hour  Intake 520 ml  Output 2750 ml  Net -2230 ml    Filed Weights   01/17/21 1350 01/17/21 1700 01/18/21 0500  Weight: 102.2 kg 100.3 kg 101.2 kg   Examination: Physical Exam:  Constitutional: WN/WD obese chronically ill-appearing African-American male currently in no acute distress appears a little uncomfortable Eyes: Lids and conjunctivae normal, sclerae anicteric  ENMT: External Ears, Nose appear normal. Grossly normal hearing.  Neck: Appears normal, supple, no cervical masses, normal ROM, no appreciable thyromegaly has a tracheostomy in place connected to ATC Respiratory: Diminished to auscultation bilaterally with coarse breath sounds, no wheezing, rales, rhonchi or crackles. Normal respiratory effort and patient is not tachypenic. No accessory muscle use.  Unlabored breathing Cardiovascular: RRR, no murmurs / rubs / gallops. S1 and S2 auscultated.  Mild extremity edema.  Abdomen: Soft, non-tender, distended second body habitus and has a PEG tube in place. Bowel sounds positive.  GU: Deferred. Musculoskeletal: No clubbing /  cyanosis of digits/nails. No joint deformity upper and lower extremities.  Skin: No rashes, lesions, ulcers on a limited skin evaluation. No induration; Warm and dry.  Neurologic: CN 2-12 grossly intact with no focal deficits. Romberg sign and cerebellar reflexes not assessed.  Psychiatric: Normal judgment and insight. Alert and oriented x 3. Normal mood and appropriate affect.   Data Reviewed: I have personally reviewed following labs and imaging studies  CBC: Recent Labs  Lab 01/14/21 0906 01/15/21 0400 01/16/21 0041 01/17/21 0845 01/18/21 0352  WBC 14.2* 15.1* 17.5* 14.5* 15.4*  NEUTROABS 9.6* 10.3* 12.8* 10.1* 10.4*  HGB 10.1* 10.1* 10.1* 9.1* 9.8*  HCT 30.2* 29.7* 31.4* 27.7* 29.3*  MCV 87.3 85.8 89.0 87.7 86.7  PLT 315 324 283 282 007    Basic Metabolic Panel: Recent Labs  Lab 01/14/21 0135 01/14/21 0906 01/15/21 0400 01/16/21 0039 01/17/21 0500 01/17/21 0845 01/18/21 0352  NA 135  --  134* 134* 132*  --  134*  K 4.4  --  4.6 4.5 5.0  --  4.4  CL 96*  --  92* 93* 93*  --  90*  CO2 23  --  22 23 22   --  24  GLUCOSE 168*  --  164* 177* 178*  --  169*  BUN 38*  --  64* 36* 66*  --  38*  CREATININE 2.88*  --  3.75* 2.88* 4.09*  --  2.89*  CALCIUM 9.5  --  10.1 9.5 9.6  --  9.2  MG  --  1.9  --   --   --  2.1 2.1  PHOS 5.1* 5.8* 7.4* 5.2* 8.0*  --  5.8*    GFR: Estimated Creatinine Clearance: 29.8 mL/min (A) (by C-G formula based on SCr of 2.89 mg/dL (H)). Liver Function Tests: Recent Labs  Lab 01/15/21 0400 01/16/21 0039 01/17/21 0500 01/17/21 0845 01/18/21 0352  AST  --   --   --  12* 16  ALT  --   --   --  16 16  ALKPHOS  --   --   --  103 117  BILITOT  --   --   --  0.8 0.9  PROT  --   --   --  8.2* 8.3*  ALBUMIN 2.8* 2.7* 2.7* 2.7* 2.8*    No results for input(s): LIPASE, AMYLASE in the last 168 hours. No results for input(s): AMMONIA in the last 168 hours. Coagulation Profile: No results for input(s): INR, PROTIME in the last 168  hours. Cardiac Enzymes: No results for input(s): CKTOTAL, CKMB, CKMBINDEX, TROPONINI in the last 168 hours. BNP (last 3 results) No results for input(s): PROBNP in the last 8760 hours. HbA1C: No results for input(s): HGBA1C in the last 72 hours. CBG: Recent Labs  Lab 01/17/21 2008 01/18/21 0048 01/18/21 0444 01/18/21 0816 01/18/21 1217  GLUCAP 176* 188* 181* 177* 208*    Lipid Profile: No results for input(s): CHOL, HDL, LDLCALC, TRIG, CHOLHDL, LDLDIRECT in the last 72 hours. Thyroid Function Tests: No results for input(s): TSH, T4TOTAL, FREET4, T3FREE, THYROIDAB in the last 72 hours. Anemia Panel: No results for input(s): VITAMINB12, FOLATE, FERRITIN, TIBC, IRON, RETICCTPCT in the last 72 hours. Sepsis Labs: Recent Labs  Lab 01/14/21 1121 01/15/21 0400 01/16/21 0039  PROCALCITON 1.14 0.98 1.64    Recent Results (from the past 240 hour(s))  Urine Culture     Status: Abnormal   Collection Time: 01/14/21  8:46 AM   Specimen: In/Out Cath Urine  Result Value Ref Range Status   Specimen Description IN/OUT CATH URINE  Final   Special Requests   Final    NONE Performed at [facility], [address], [place], [place] [address]    Culture MULTIPLE SPECIES PRESENT, SUGGEST RECOLLECTION (A)  Final   Report Status 01/15/2021 FINAL  Final  Culture, blood (Routine X 2) w Reflex to ID Panel     Status: None (Preliminary result)   Collection Time: 01/14/21 11:21 AM   Specimen: BLOOD  Result Value Ref Range Status   Specimen Description BLOOD RIGHT ANTECUBITAL  Final   Special Requests   Final    BOTTLES DRAWN AEROBIC ONLY Blood Culture adequate volume   Culture   Final    NO GROWTH 3 DAYS Performed at [facility], [place] [address], [place], [place] [address]    Report Status PENDING  Incomplete  Culture, blood (Routine X 2) w Reflex to ID Panel     Status: None (Preliminary result)   Collection Time: 01/14/21 11:22 AM   Specimen: BLOOD  Result Value Ref Range  Status   Specimen Description BLOOD LEFT ANTECUBITAL  Final   Special Requests   Final    BOTTLES DRAWN AEROBIC ONLY Blood Culture adequate volume   Culture   Final    NO GROWTH 3 DAYS Performed at [facility], [place] [address] [place], [place] [address]    Report Status PENDING  Incomplete  Culture, Respiratory w Gram Stain     Status: None   Collection Time: 01/14/21 11:40 AM   Specimen: Tracheal Aspirate; Respiratory  Result Value Ref Range Status   Specimen Description TRACHEAL ASPIRATE  Final   Special Requests NONE  Final   Gram Stain   Final    ABUNDANT WBC PRESENT,BOTH PMN AND MONONUCLEAR FEW SQUAMOUS EPITHELIAL CELLS PRESENT MODERATE GRAM NEGATIVE RODS FEW GRAM VARIABLE ROD Performed at [facility], [place] [address], [place], [place] [address]    Culture MODERATE PSEUDOMONAS AERUGINOSA  Final   Report Status 01/18/2021 FINAL  Final   Organism ID, Bacteria PSEUDOMONAS AERUGINOSA  Final      Susceptibility   Pseudomonas aeruginosa - MIC*    CEFTAZIDIME <=1 SENSITIVE Sensitive     CIPROFLOXACIN <=0.25 SENSITIVE Sensitive     GENTAMICIN <=1 SENSITIVE Sensitive     PIP/TAZO <=4 SENSITIVE Sensitive     * MODERATE PSEUDOMONAS AERUGINOSA      RN Pressure Injury Documentation: Pressure Injury 12/14/20 Face Upper;Mid Deep Tissue Pressure Injury - Purple or maroon localized area of discolored intact skin or blood-filled blister due to damage of underlying soft tissue from pressure and/or shear. circular shape broken area & sloug (Active)  12/14/20 0100  Location: Face  Location Orientation: Upper;Mid  Staging: Deep Tissue Pressure Injury - Purple or maroon localized area of discolored intact skin or blood-filled blister due to damage of underlying soft tissue from pressure and/or shear.  Wound Description (Comments): circular shape broken area & sloughing skin  Present on Admission: No     Pressure Injury 12/14/20 Face Left;Upper Deep Tissue Pressure Injury -  Purple or maroon localized area of discolored intact skin or blood-filled blister due to damage of underlying soft tissue from pressure and/or shear. circular shape broken area & slou (Active)  12/14/20 0100  Location: Face  Location Orientation: Left;Upper  Staging: Deep Tissue Pressure Injury - Purple or maroon localized area of discolored intact skin or blood-filled blister due to damage of underlying soft tissue from pressure and/or shear.  Wound Description (Comments): circular shape broken area & sloughing skin  Present on Admission: No     Pressure Injury 12/23/20 Buttocks Left Stage 2 -  Partial thickness loss of dermis presenting as a shallow open injury with a red, pink wound bed without slough. left buttock stage 2 (Active)  12/23/20   Location: Buttocks  Location Orientation: Left  Staging: Stage 2 -  Partial thickness loss of dermis presenting as a shallow open injury with a red, pink wound bed without slough.  Wound Description (Comments): left buttock stage 2  Present on Admission: Yes    Estimated body mass index is 33.92 kg/m as calculated from the following:   Height as of this encounter: 5' 8"  (1.727 m).   Weight as of this encounter: 101.2 kg.  Malnutrition Type:  Nutrition Problem: Moderate Malnutrition Etiology: chronic illness, cancer and cancer related treatments  Malnutrition Characteristics:  Signs/Symptoms: mild fat depletion, mild muscle depletion, percent weight loss Percent weight loss: 11 %  Nutrition Interventions:  Interventions: Prostat, Tube feeding, MVI   Radiology Studies: DG CHEST PORT 1 VIEW  Result Date: 01/18/2021 CLINICAL DATA:  Shortness of breath.  Follow-up tubes and catheters. EXAM: PORTABLE CHEST 1 VIEW COMPARISON:  01/08/2021 FINDINGS: Borderline enlarged cardiac silhouette. Tracheostomy tube in satisfactory position. Feeding tube extending into the distal stomach. Left jugular catheter tip in the inferior aspect of the superior  vena cava. Right  jugular porta catheter tip at the superior cavoatrial junction. Interval mild right basilar atelectasis and possible small pleural effusion. Clear left lung. Unremarkable bones. IMPRESSION: Interval mild right basilar atelectasis and possible small right pleural effusion. Electronically Signed   By: Claudie Revering M.D.   On: 01/18/2021 09:52    Scheduled Meds:  acetaminophen  1,000 mg Per Tube Q8H   amLODipine  5 mg Per Tube Daily   apixaban  5 mg Per Tube BID   vitamin C  500 mg Per Tube TID   atorvastatin  20 mg Per Tube Daily   chlorhexidine gluconate (MEDLINE KIT)  15 mL Mouth Rinse BID   Chlorhexidine Gluconate Cloth  6 each Topical Q0600   Chlorhexidine Gluconate Cloth  6 each Topical Q0600   clonazepam  0.5 mg Per Tube BID   feeding supplement (PROSource TF)  45 mL Per Tube BID   fiber  1 packet Per Tube BID   fluconazole  50 mg Per Tube Daily   gabapentin  100 mg Per Tube Q8H   hydrocerin   Topical BID   insulin aspart  0-15 Units Subcutaneous Q4H   insulin detemir  15 Units Subcutaneous BID   levETIRAcetam  500 mg Per Tube BID   loperamide HCl  1 mg Per Tube BID   mouth rinse  15 mL Mouth Rinse 10 times per day   multivitamin  1 tablet Per Tube QHS   nystatin  5 mL Per Tube QID   QUEtiapine  50 mg Per Tube BID   sevelamer carbonate  2.4 g Per Tube TID WC   thiamine  100 mg Per Tube Daily   Continuous Infusions:  sodium chloride Stopped (12/20/20 0955)   anticoagulant sodium citrate     cefTAZidime (FORTAZ)  IV 2 g (01/15/21 1326)   feeding supplement (JEVITY 1.5 CAL/FIBER) 1,000 mL (01/18/21 0524)    LOS: 98 days   Kerney Elbe, DO [facility] PAGER is on AMION  If 7PM-7AM, please contact night-coverage [email or web address]

## 2021-01-19 ENCOUNTER — Inpatient Hospital Stay (HOSPITAL_COMMUNITY): Payer: Medicare HMO

## 2021-01-19 DIAGNOSIS — Z992 Dependence on renal dialysis: Secondary | ICD-10-CM

## 2021-01-19 DIAGNOSIS — Z978 Presence of other specified devices: Secondary | ICD-10-CM

## 2021-01-19 DIAGNOSIS — Z930 Tracheostomy status: Secondary | ICD-10-CM | POA: Diagnosis not present

## 2021-01-19 DIAGNOSIS — C184 Malignant neoplasm of transverse colon: Secondary | ICD-10-CM | POA: Diagnosis not present

## 2021-01-19 DIAGNOSIS — Z515 Encounter for palliative care: Secondary | ICD-10-CM

## 2021-01-19 DIAGNOSIS — E1100 Type 2 diabetes mellitus with hyperosmolarity without nonketotic hyperglycemic-hyperosmolar coma (NKHHC): Secondary | ICD-10-CM | POA: Diagnosis not present

## 2021-01-19 LAB — CBC WITH DIFFERENTIAL/PLATELET
Abs Immature Granulocytes: 0.23 10*3/uL — ABNORMAL HIGH (ref 0.00–0.07)
Basophils Absolute: 0.2 10*3/uL — ABNORMAL HIGH (ref 0.0–0.1)
Basophils Relative: 1 %
Eosinophils Absolute: 0.6 10*3/uL — ABNORMAL HIGH (ref 0.0–0.5)
Eosinophils Relative: 3 %
HCT: 29.2 % — ABNORMAL LOW (ref 39.0–52.0)
Hemoglobin: 9.7 g/dL — ABNORMAL LOW (ref 13.0–17.0)
Immature Granulocytes: 1 %
Lymphocytes Relative: 11 %
Lymphs Abs: 1.9 10*3/uL (ref 0.7–4.0)
MCH: 29 pg (ref 26.0–34.0)
MCHC: 33.2 g/dL (ref 30.0–36.0)
MCV: 87.2 fL (ref 80.0–100.0)
Monocytes Absolute: 2 10*3/uL — ABNORMAL HIGH (ref 0.1–1.0)
Monocytes Relative: 11 %
Neutro Abs: 12.9 10*3/uL — ABNORMAL HIGH (ref 1.7–7.7)
Neutrophils Relative %: 73 %
Platelets: 278 10*3/uL (ref 150–400)
RBC: 3.35 MIL/uL — ABNORMAL LOW (ref 4.22–5.81)
RDW: 15.8 % — ABNORMAL HIGH (ref 11.5–15.5)
WBC: 17.8 10*3/uL — ABNORMAL HIGH (ref 4.0–10.5)
nRBC: 0 % (ref 0.0–0.2)

## 2021-01-19 LAB — RENAL FUNCTION PANEL
Albumin: 2.8 g/dL — ABNORMAL LOW (ref 3.5–5.0)
Anion gap: 17 — ABNORMAL HIGH (ref 5–15)
BUN: 65 mg/dL — ABNORMAL HIGH (ref 8–23)
CO2: 24 mmol/L (ref 22–32)
Calcium: 9.7 mg/dL (ref 8.9–10.3)
Chloride: 93 mmol/L — ABNORMAL LOW (ref 98–111)
Creatinine, Ser: 3.71 mg/dL — ABNORMAL HIGH (ref 0.61–1.24)
GFR, Estimated: 17 mL/min — ABNORMAL LOW (ref >60)
Glucose, Bld: 189 mg/dL — ABNORMAL HIGH (ref 70–99)
Phosphorus: 8.5 mg/dL — ABNORMAL HIGH (ref 2.5–4.6)
Potassium: 5.3 mmol/L — ABNORMAL HIGH (ref 3.5–5.1)
Sodium: 134 mmol/L — ABNORMAL LOW (ref 135–145)

## 2021-01-19 LAB — COMPREHENSIVE METABOLIC PANEL
ALT: 15 U/L (ref 0–44)
AST: 14 U/L — ABNORMAL LOW (ref 15–41)
Albumin: 2.8 g/dL — ABNORMAL LOW (ref 3.5–5.0)
Alkaline Phosphatase: 109 U/L (ref 38–126)
Anion gap: 19 — ABNORMAL HIGH (ref 5–15)
BUN: 67 mg/dL — ABNORMAL HIGH (ref 8–23)
CO2: 22 mmol/L (ref 22–32)
Calcium: 9.6 mg/dL (ref 8.9–10.3)
Chloride: 91 mmol/L — ABNORMAL LOW (ref 98–111)
Creatinine, Ser: 3.74 mg/dL — ABNORMAL HIGH (ref 0.61–1.24)
GFR, Estimated: 17 mL/min — ABNORMAL LOW (ref >60)
Glucose, Bld: 192 mg/dL — ABNORMAL HIGH (ref 70–99)
Potassium: 5.3 mmol/L — ABNORMAL HIGH (ref 3.5–5.1)
Sodium: 132 mmol/L — ABNORMAL LOW (ref 135–145)
Total Bilirubin: 0.7 mg/dL (ref 0.3–1.2)
Total Protein: 8.1 g/dL (ref 6.5–8.1)

## 2021-01-19 LAB — MAGNESIUM: Magnesium: 2.1 mg/dL (ref 1.7–2.4)

## 2021-01-19 LAB — GLUCOSE, CAPILLARY
Glucose-Capillary: 173 mg/dL — ABNORMAL HIGH (ref 70–99)
Glucose-Capillary: 184 mg/dL — ABNORMAL HIGH (ref 70–99)
Glucose-Capillary: 212 mg/dL — ABNORMAL HIGH (ref 70–99)
Glucose-Capillary: 185 mg/dL — ABNORMAL HIGH (ref 70–99)
Glucose-Capillary: 174 mg/dL — ABNORMAL HIGH (ref 70–99)

## 2021-01-19 LAB — CULTURE, BLOOD (ROUTINE X 2)
Culture: NO GROWTH
Culture: NO GROWTH
Special Requests: ADEQUATE
Special Requests: ADEQUATE

## 2021-01-19 LAB — PHOSPHORUS: Phosphorus: 8.2 mg/dL — ABNORMAL HIGH (ref 2.5–4.6)

## 2021-01-19 MED ORDER — LIP MEDEX EX OINT
TOPICAL_OINTMENT | CUTANEOUS | Status: DC | PRN
  Filled 2021-01-19: qty 7

## 2021-01-19 MED ORDER — SODIUM CHLORIDE 0.9 % IV SOLN
1.0000 g | Freq: Once | INTRAVENOUS | Status: AC
  Administered 2021-01-19: 1 g via INTRAVENOUS
  Filled 2021-01-19: qty 1

## 2021-01-19 NOTE — Progress Notes (Signed)
Palliative Care Progress Note, Assessment & Plan   Patient Name: Thomas [name]       Date: 01/19/2021 DOB: December 03, 1955  Age: 65 y.o. MRN#: [id number] Attending Physician: Kerney Elbe, DO Primary Care Physician: [name], NP Admit Date: 11/30/2020  Reason for Consultation/Follow-up: Establishing goals of care  Subjective: Patient is resting in bed in NAD. His wife and daughter are bedside. His trach is clean and dry and his cortrak is in place.   HPI: [name] is a 65 y.o. married AA male with a PMH significant for colon cancer s/p resection and colostomy May 2022, s/p chemotherapy X 3 cycles July 2022, DMII with nueropathy, HTN, and HLD. He was admitted on 11/30/20 with abdominal pain with lactic 6.6 while also tachypnic, tachycardic, and febrile.  As per CCM Resident Dr. Nicki Guadalajara note, significant hospital events include:  7/27 Transferred to [facility], stopped abx as sepsis resolved/ruled out 7/30 AM Worsened AME, more hypoxic with worsening renal funciton, new aspiration on abx restarted 7/30 PM Pt with increased o2 requirement on 100% FIO2 on BIPAP, d/w family who wanted to proceed with intubation, pt intubated 7/31 Tracheal aspirate grew moderate candida tropicalis  8/1 Some issues with tachycardia and hypotension overnight. Reproted high ostomy output.  GI PCR negative 8/2 down to 9 mcg of Levophed, however repeat CT abdomen/pelvis showed inflammatory wall thickening and fat stranding of the descending and sigmoid colon, new since prior, consistent with nonspecific infectious, inflammatory, or ischemic colitis.  Also new dense consolidative airspace opacity in the right lower and right middle lobes 8/3 further weaning NE. Weaning sedation. Full code, full scope per family meeting 8/2. Eraxis  stopped (8/1-8/3) 8/4 off pressors, plt down to 103.  8/5 remains off pressors. Moving around more, not really following commands though  8/9 failed extubation 8/8, tolerated nasal cannula for about an hour, desaturations and inability to clear secretions-reintubated 8/8 8/10 some agitation overnight 8/11 tracheostomy placement On trach collar since 8/16 8/22 pulled trach out, replaced under bronch guidance. Has proximal area of granulation tissue that is above the bottom of the trach tube.  8/25 Removed Trach, unable to reinsert, despite multiple attempts re-intubated 8/29 Anemia, 1 unit PRBC. ENT re-placed Trach in [place]. 9/8 tracheal aspirate growing pan-sensitive P. Aeruginosa. Started on Ceftazidine   As of today, patient has a cuffless XLT #6 trach with ongoing tube feeds via cortrak. He is receiving inpatient HD and his challenge remains his inability to sit up and transfer appropriately for outpatient HD.  According to TOC's note from this morning the patient will not be able to go to [facility] at this time since there is no HD bed available.  Palliative Medicine was consulted to discuss [place].  Plan of Care: I have reviewed medical records including EPIC notes, labs and imaging, assessed the patient and then met with the patient, patient's wife, and patient's daughter Thomas [name] at bedside to discuss diagnosis prognosis, GOC, disposition and options.  Even though the patient and wife have met with PMT several times during this hospitalization, I reiterated that Palliative Medicine is a specialized medical care for people living with serious illness. It focuses on providing relief from the symptoms and stress of  a serious illness. The goal is to improve quality of life for both the patient and the family.  I shared that we have been watching the patient's chart and monitoring peripherally.  I shared that I just wanted to touch base with them to make sure that they knew we were still part of his care  team.  When asked if there was any concerns or questions the patient wrote on his notepad that he is ready for his swallow test today.  I shared that I thought this was going to happen about 1:30 PM today.  He seemed appreciative to have a timeframe.    He denied any further pain at his trach site.  When asked if he had any pain he motions to his stomach but said it was not a lot.  His goals of care have not changed.  He wants to remain a full code and would like any and all offered medical interventions.  His wife at the bedside did not have any concerns or questions.    As an aside, the patient still has his sense of humor.  He wrote down that he did not who was know who was standing beside me and was wondering why she was wandering around all morning.  It is his daughter Thomas [name].  We all laughed and the patient even smiled himself.  I shared that sometimes Humira is the best medicine especially in these long drawn out hospitalizations.  He also wrote downward discharge.  I shared that we are all working towards trying to find a suitable place that we will be able to accommodate his needs.  He shook his head and understanding.  Discussed with patient/family the importance of continued conversation with family and the medical providers regarding overall plan of care and treatment options, ensuring decisions are within the context of the patient's values and GOCs.    Questions and concerns were addressed. The family was encouraged to call with questions or concerns.   Code Status: Full code  Prognosis:  Unable to determine  Discharge Planning: To Be Determined  Recommendations/Plan: Continue current treatments SLP evaluation today FULL CODE  Care plan was discussed with patient, patient's wife, patient's daughter  Length of Stay: 46  Physical Exam HENT:     Head: Normocephalic and atraumatic.  Cardiovascular:     Rate and Rhythm: Normal rate.  Pulmonary:     Effort: Pulmonary  effort is normal.     Comments: Cuffless XLT #6 trach and cortrak in place Abdominal:     Tenderness: There is generalized abdominal tenderness.  Skin:    General: Skin is warm and dry.  Neurological:     Mental Status: He is alert.            Vital Signs: BP 139/72 (BP Location: Left Arm)   Pulse 97   Temp 97.7 F (36.5 C) (Oral)   Resp 17   Ht 5' 8"  (1.727 m)   Wt 102.9 kg   SpO2 99%   BMI 34.49 kg/m  SpO2: SpO2: 99 % O2 Device: O2 Device: Tracheostomy Collar O2 Flow Rate: O2 Flow Rate (L/min): 5 L/min      Palliative Assessment/Data: 40%       Total Time 15 minutes Prolonged Time Billed  no   Greater than 50%  of this time was spent counseling and coordinating care related to the above assessment and plan.  Thank you for allowing the Palliative Medicine Team to assist in the care  of this patient.  Malcom Ilsa Iha, FNP-BC Palliative Medicine Team Team Phone # [phone]

## 2021-01-19 NOTE — Progress Notes (Signed)
Physical Therapy Treatment Patient Details Name: Thomas [name] MRN: [id number] DOB: 08-30-1955 Today's Date: 01/19/2021   History of Present Illness 65 y.o. male present to ED 7/24 with high ostomy output. Patient admitted with severe sepsis and multisystem organ failure. Hospital admission complicated by volume overload, aspiration pneumonia, acute respiratory failure, AKI and ischemic colitis. Intubated 7/30. S/p trach placement 8/11. CorTrak 8/12. HD initiated 8/12. To IR 8/15 for tunneled HD cath. Pulled out trach x2 8/22 replaced by PCCM. Pulled out trach again 8/25 but unable to be reinserted with pt intubated. 8/29 to OR for stoma clean up and trach replacement.Trach downsized 9/10.  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    PT Comments    Pt is starting to improve more steadily toward goals, but still limited by anxiety and pain.  Emphasis on having pt execute/initiate movement to cued sequence and help as needed, transition to sitting, scooting, sitting balance, sit to stand x4 with pre-gait activity and then transfer with pivotal steps to the chair.    Recommendations for follow up therapy are one component of a multi-disciplinary discharge planning process, led by the attending physician.  Recommendations may be updated based on patient status, additional functional criteria and insurance authorization.  Follow Up Recommendations  SNF     Equipment Recommendations  Other (comment)    Recommendations for Other Services       Precautions / Restrictions Precautions Precautions: Fall Precaution Comments: cortrak, R subclavian tunneled HD cath, ostomy, O2 via trach collar     Mobility  Bed Mobility Overal bed mobility: Needs Assistance Bed Mobility:  Rolling;Sidelying to Sit Rolling: Mod assist;+2 for safety/equipment Sidelying to sit: Mod assist;+2 for safety/equipment       General bed mobility comments: bed flat, pt cued  in rolling sequence, allowed to use the rail and truncal assist given/stability while pt getting R UE into good assisting position.    Transfers Overall transfer level: Needs assistance Equipment used: 2 person hand held assist (chair back for assist) Transfers: Sit to/from Stand Sit to Stand: Min assist;Mod assist;+2 physical assistance (x4) Stand pivot transfers: Mod assist;+2 physical assistance       General transfer comment: Pt consistently cued on hand placement and technique.  Assist to come both forward and to boost.  Ambulation/Gait             General Gait Details: not able yet   Stairs             Wheelchair Mobility    Modified Rankin (Stroke Patients Only)       Balance Overall balance assessment: Needs assistance Sitting-balance support: Feet supported;Bilateral upper extremity supported Sitting balance-Leahy Scale: Fair       Standing balance-Leahy Scale: Poor Standing balance comment: reliant on AD and or external support.  Stress on full extended upright posture, worked on w/shift and then stepping in place as a progression through the 4 trials ending with pivot to the recliner.                            Cognition Arousal/Alertness: Awake/alert Behavior During Therapy: WFL for tasks assessed/performed;Anxious Overall Cognitive Status: Within Functional Limits for tasks assessed  Exercises Other Exercises Other Exercises: bil bicep/tricep presses with graded resistance x 10 reps Other Exercises: bil hip/knee flex/ext with graded assist/resistance x10 reps    General Comments General comments (skin integrity, edema, etc.): vss overall with pt on 28% FiO2 humidifiecd; placed 2 bandages on small  skin breakdown areas both heels.      Pertinent Vitals/Pain Pain Assessment: No/denies pain Faces Pain Scale: Hurts even more Pain Location: back Pain Descriptors / Indicators: Aching;Moaning;Grimacing Pain Intervention(s): Premedicated before session;Monitored during session    Home Living                      Prior Function            PT Goals (current goals can now be found in the care plan section) Acute Rehab PT Goals Patient Stated Goal: to decrease pain PT Goal Formulation: With patient Time For Goal Achievement: 01/21/21 Potential to Achieve Goals: Good Progress towards PT goals: Progressing toward goals    Frequency    Min 3X/week      PT Plan Current plan remains appropriate    Co-evaluation              AM-PAC PT "6 Clicks" Mobility   Outcome Measure  Help needed turning from your back to your side while in a flat bed without using bedrails?: A Lot Help needed moving from lying on your back to sitting on the side of a flat bed without using bedrails?: A Lot Help needed moving to and from a bed to a chair (including a wheelchair)?: Total Help needed standing up from a chair using your arms (e.g., wheelchair or bedside chair)?: A Lot Help needed to walk in hospital room?: Total Help needed climbing 3-5 steps with a railing? : Total 6 Click Score: 9    End of Session Equipment Utilized During Treatment: Oxygen Activity Tolerance: Patient tolerated treatment well;Patient limited by pain Patient left: in chair;with call bell/phone within reach;with chair alarm set Nurse Communication: Mobility status PT Visit Diagnosis: Muscle weakness (generalized) (M62.81);Other abnormalities of gait and mobility (R26.89)     Time: 2563-8937 PT Time Calculation (min) (ACUTE ONLY): 34 min  Charges:  $Therapeutic Activity: 23-37 mins                     01/19/2021  Thomas Carne., PT Acute Rehabilitation Services [phone]  (pager) [phone]   (office)   Thomas [name] 01/19/2021, 2:29 PM

## 2021-01-19 NOTE — Progress Notes (Addendum)
[facility] PROGRESS NOTE  Thomas [name]:[id number] DOB: 12-13-1955 DOA: 11/30/2020 PCP: [name], NP  Status: Remains inpatient appropriate because:Persistent severe electrolyte disturbances, Unsafe d/c plan, and Inpatient level of care appropriate due to severity of illness  Dispo: The patient is from: Home              Anticipated d/c is to: SNF versus LTAC              Patient currently is not medically stable to d/c.   Difficult to place patient Yes               Barriers to discharge: Currently has tracheostomy in place and is requiring dialysis but remains too weak to tolerate dialysis in chair which is a requirement for outpatient HD    Level of care: Progressive  Code Status: Full Family Communication: Patient only; attempted to contact [name] both on the home phone and the mobile phone but was unsuccessful in reaching her DVT prophylaxis: Eliquis COVID vaccination status: Moderna 07/08/2019 and 08/11/2019 with first booster dose given 08/05/2020   HPI:  65 y.o. male with a history of colon cancer s/p resection and colostomy May 2022 s/p 3 cycles chemotherapy (last was July 2022), T2DM with neuropathy, HTN, HLD who presented to the ED 7/24 with abdominal pain found to be febrile, tachycardic, tachypneic. Lactic acid was 6.6 and persisted at 6.8 after sepsis IV fluid bolus and cefepime. He had ARF, demand ischemia, bandemia, was hyperglycemic with negative ketones, with lactic acidosis. He developed AFib with RVR in the ED, was started on diltiazem, and was admitted to the ICU for septic shock with unclear source. Sepsis was subsequently felt to be ruled out at time of admission, presentation felt more likely to be related to cepacitabine toxicity with oral mucositis, hand/foot syndrome, and diarrhea/high output colostomy. He was transferred to hospitalist service 7/27 but developed metabolic encephalopathy with hyoxia due to aspiration pneumonia on 7/30 requiring  intubation and pressor support. Failed extubation with inability to clear secretions, requiring reintubation 8/8, and tracheostomy subsequently placed 8/11. This was pulled out 8/22, replaced under bronch guidance, again removed 8/25 requiring reintubation after failed reinsertion. ENT revised tracheostomy in the OR 8/29. He has required IHD for ARF, and also had complications of RLE DVT now on eliquis, colitis thought to be ischemic 8/2 not requiring specific intervention, Pseudomonas pneumonia having completed treatment, agitated encephalopathy which has improved, anemia requiring 4u PRBCs total, and thrombocytopenia which has resolved. On 9/4 he was transferred to hospitalist service having tolerated trach collar.  Subjective: Alert.  Asking to be suctioned by writing me a note.  Denies throat or abdominal pain.  Tells me speech therapy will be back at 130 today for further evaluation  Objective: Vitals:   01/19/21 0400 01/19/21 0429  BP:    Pulse:  (!) 103  Resp:  (!) 22  Temp: (!) 97.5 F (36.4 C)   SpO2:  100%    Intake/Output Summary (Last 24 hours) at 01/19/2021 0807 Last data filed at 01/19/2021 0511 Gross per 24 hour  Intake --  Output 2162 ml  Net -2162 ml   Filed Weights   01/17/21 1700 01/18/21 0500 01/19/21 0500  Weight: 100.3 kg 101.2 kg 102.9 kg    Exam: Constitutional: NAD, calm, comfortable Respiratory: #6 XLT uncuffed trach 9/9; 28% trach collar, clear to auscultation.  Lungs are coarse but clear to auscultation.  Tan secretions at trach Cardiovascular: S1-S2, normotensive, regular pulse.  No peripheral edema.  Normal capillary refill. Abdomen: Soft and nontender, Bowel sounds positive. LBM 9/8; colostomy with liquid stool; cortrack for tube feedings and medication Neurologic: CN 2-12 grossly intact. Sensation intact, Strength 3/5 x all 4 extremities.  Psychiatric: Appears to have normal judgment and insight. Alert and oriented x 3. Normal mood.  Writing  notes.   Assessment/Plan: Acute problems:  Acute hypoxic respiratory failure s/p tracheostomy due to aspiration pneumonia  Recurrent pansensitive Pseudomonas tracheobronchitis -Symptoms consistent with tracheobronchitis likely secondary to recurrent Pseudomonas -Tolerating PMV.  Trach team wants to make sure patient more mobile with stronger vocalization before trying capping trials. -WBC: 12.8 > 14.2 > 15.1; CRP: 1.7 > 1.2 > 0.9; PCT: 1.14 > 0.98 > 1.64 -Tracheal aspirate culture positive for GNR's; UA with multiple species. 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   - Continue HD TTS thru L IJ TDC per nephrology.  Patient needs to tolerate sitting up in chair for dialysis treatments -Continue OOB to chair TID -Not expected to recover renal function therefore will be on permanent HD   High output ostomy, diarrhea:  -Negative C. diff PCR (7/26) and GI pathogen panel (8/1). Possibly ischemic colitis on CT 8/2-need to maintain blood pressure during dialysis treatment -Continue scheduled Imodium and continue fiber per tube -Will DC PPI twice daily since this class of drug can promote diarrhea   Anemia of critical illness, acute blood loss as well as anemia of chronic disease and iron deficiency:  - s/p 4u PRBCs this hospitalization  - Getting IV iron TTS x8 doses per nephrology. 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 Benay Spice. Started CAPOX June 2022, reduced cepecitabine dose starting 11/19/2020 due to hand/foot syndrome, reduced oxaliplatin due to renal impairment and poor performance status.  - Dr. Benay Spice has followed during this hospitalization. Per note 8/2 "[pt]  is undergoing curative therapy for colon cancer..." - Palliative care has been involved during this hospitalization, though goals remain clear: full aggressive measures desired.    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  Continue Oxy IR to 7.5 mg.  OT/PT note that if patient continues to make progress likely would be a candidate for CIR   Acute extensive right leg DVT:  Involving CFV, SF junction, proximal femoral and profunda veins and popliteal veins. Dx 8/5.  Did not have significant edema therefore did not require thrombolysis - Continue eliquis   New onset atrial  fibrillation with RVR:  -Currently maintaining sinus rhythm w/o meds - Continue eliquis as above   Seizure disorder:  - Continue keppra   Hand/foot syndrome related to xeloda:  -Dose reduced in the outpatient setting and given acuity of current symptoms this medication has not been utilized during hospitalization - No infection noted at this time. Will continue moisturizing topical Tx   Acute metabolic encephalopathy, agitated delirium:  -Resolved - Continue clonazepam 0.58m per tube BID and prn-taper and DC as  tolerated - Continue quetiapine 59mBID-taper and DC as tolerated   Uncontrolled T2DM on OHAs prior to admission with associated neuropathy:  HbA1c 8.7% at admission. - CBGs well-controlled currently, continue levemir 15u BID and SSI q4h.  - Continue gabapentin 10038m8h.   HTN:  - Continue norvasc   HLD:  - Continue atorvastatin   Thrombocytopenia:  -Transient likely due to critical illness. HIT Ab negative on 8/5.    Stress ulcer prophylaxis -Continue PPI but consider discontinuing since this medication can contribute to loose stools and diarrhea   ?Sepsis - initially thought to be present on admission- has been ruled out.     Data Reviewed: Basic Metabolic Panel: Recent Labs  Lab 01/14/21 0906 01/15/21 0400 01/16/21 0039 01/17/21 0500 01/17/21 0845 01/18/21 0352 01/19/21 0230  NA  --  134* 134* 132*  --  134* 132*  134*  K  --  4.6 4.5 5.0  --  4.4 5.3*  5.3*  CL  --  92* 93* 93*  --  90* 91*  93*  CO2  --  22 23 22   --  24 22  24   GLUCOSE  --  164* 177* 178*  --  169* 192*  189*  BUN  --  64* 36* 66*  --  38* 67*  65*  CREATININE  --  3.75* 2.88* 4.09*  --  2.89* 3.74*  3.71*  CALCIUM  --  10.1 9.5 9.6  --  9.2 9.6  9.7  MG 1.9  --   --   --  2.1 2.1 2.1  PHOS 5.8* 7.4* 5.2* 8.0*  --  5.8* 8.2*  8.5*   Liver Function Tests: Recent Labs  Lab 01/16/21 0039 01/17/21 0500 01/17/21 0845 01/18/21 0352 01/19/21 0230  AST  --   --  12* 16 14*  ALT  --   --  16 16 15   ALKPHOS  --   --  103 117 109  BILITOT  --   --  0.8 0.9 0.7  PROT  --   --  8.2* 8.3* 8.1  ALBUMIN 2.7* 2.7* 2.7* 2.8* 2.8*  2.8*    CBC: Recent Labs  Lab 01/15/21 0400 01/16/21 0041 01/17/21 0845 01/18/21 0352 01/19/21 0230  WBC 15.1* 17.5* 14.5* 15.4* 17.8*  NEUTROABS 10.3* 12.8* 10.1* 10.4* 12.9*  HGB 10.1* 10.1* 9.1* 9.8* 9.7*  HCT 29.7* 31.4* 27.7* 29.3* 29.2*  MCV 85.8 89.0 87.7 86.7 87.2  PLT 324 283 282 261 278    CBG: Recent Labs  Lab 01/18/21 1217  01/18/21 1620 01/18/21 1935 01/18/21 2319 01/19/21 0431  GLUCAP 208* 193* 173* 173* 173*    Scheduled Meds:  acetaminophen  1,000 mg Per Tube Q8H   amLODipine  5 mg Per Tube Daily   apixaban  5 mg Per Tube BID   vitamin C  500 mg Per Tube TID   atorvastatin  20 mg Per Tube Daily   chlorhexidine gluconate (MEDLINE KIT)  15 mL Mouth Rinse BID  Chlorhexidine Gluconate Cloth  6 each Topical Q0600   Chlorhexidine Gluconate Cloth  6 each Topical Q0600   clonazepam  0.5 mg Per Tube BID   feeding supplement (PROSource TF)  45 mL Per Tube BID   fiber  1 packet Per Tube BID   fluconazole  50 mg Per Tube Daily   gabapentin  100 mg Per Tube Q8H   hydrocerin   Topical BID   insulin aspart  0-15 Units Subcutaneous Q4H   insulin detemir  15 Units Subcutaneous BID   levETIRAcetam  500 mg Per Tube BID   loperamide HCl  1 mg Per Tube BID   mouth rinse  15 mL Mouth Rinse 10 times per day   multivitamin  1 tablet Per Tube QHS   nystatin  5 mL Per Tube QID   QUEtiapine  50 mg Per Tube BID   sevelamer carbonate  2.4 g Per Tube TID WC   thiamine  100 mg Per Tube Daily   Continuous Infusions:  sodium chloride Stopped (12/20/20 0955)   anticoagulant sodium citrate     cefTAZidime (FORTAZ)  IV 2 g (01/15/21 1326)   feeding supplement (JEVITY 1.5 CAL/FIBER) 1,000 mL (01/18/21 0524)    Principal Problem:   Diabetes mellitus with hyperosmolarity without hyperglycemic hyperosmolar nonketotic coma ([place]) Active Problems:   Lactic acidosis   Acute renal failure superimposed on stage 2 chronic kidney disease (HCC)   Metabolic acidosis   Tachycardia   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 ([place])   Adverse effect of chemotherapy   Pressure injury of skin   Atrial fibrillation with RVR ([place])   Sepsis ([place])   Malnutrition of moderate degree   Acute metabolic encephalopathy   Acute respiratory failure with hypoxemia (HCC)   Acute renal failure with acute cortical necrosis  ([place])   Shock ([place])   Status post tracheostomy [facility])   Consultants: PCCM General surgery Nephrology Hematology/oncology Palliative care medicine team  Procedures: 2u PRBC 7/31, 1u PRBC 8/17, 1u PRBC 8/27 Tracheostomy revision 01/05/2021 Dr. Constance Holster    Antibiotics: Vancomycin 7/24 Cefepime 7/24 - 7/27 Unasyn 7/30 >> cefepime/flagyl 7/31 - 8/6     Time spent: 40 minutes    [name] ANP  [facility] 7 am - 330 pm/M-F for direct patient care and secure chat Please refer to Amion for contact info 49  days

## 2021-01-19 NOTE — Progress Notes (Signed)
  Speech Language Pathology Treatment: Dysphagia;Passy Muir Speaking valve  Patient Details Name: Thomas [name] MRN: [id number] DOB: 1956-01-31 Today's Date: 01/19/2021 Time: 8144-8185 SLP Time Calculation (min) (ACUTE ONLY): 27 min  Assessment / Plan / Recommendation Clinical Impression  Thomas [name] was seen for treatment. Thomas [name] was alert and cooperative during the session. Thomas [name] reported that his neck has not been in pain since his trach change on Friday. Thomas [name] tolerated PMSV for 25 mins without overt respiratory distress and Thomas [name] was able to participate in conversation with reduced vocal intensity secondary to reduced respiratory support. Vocal quality was breathy and intermittently wet; improvement noted with coughing. Thomas [name] demonstrated improved hyolaryngeal movement with cues for an effortful swallow. Lingual movement continues to be limited, but Thomas [name] independently used a posterior head tilt to facilitate posterior bolus transport of puree boluses. A.modified barium swallow study is still recommended to further assess swallow physiology and it is scheduled for today at 1330.    HPI HPI: Thomas [name] is a 65 y/o male admitted on 7/25 for worsening sepsis vs reaction to chemotherapy with high ostomy output and soft BP. Thomas [name] developed worsening hypoxia and was requiring BIPAP w/ FIO2 of 100% and increased work of breathing. SLP consulted due to concern for aspiration. BSE 7/28 recommended NPO status and MBS, but this could not completed subsequently due to lethargy. CXR in AM of 7/30 showed some worsening concern of aspiration. ETT 7/30-8/8; tolerated nasal cannula for about an hour; reintubated 8/8-trach 8/11. Thomas [name] pulled out trach x2 8/22; replaced by PCCM. Bronch on 8/22 revealed endotracheal granulation tissue at trach site. Trach removed again on 8/25; unable to reinsert despite multiple attenpts; Thomas [name] reintubated 8/25- trach by ENT on 8/29. BSE 8/22 limited to ice chips due to Thomas [name] refusal of other consistencies; rx NPO status with  Cortrak. 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      SLP Plan  MBS       Recommendations  Diet recommendations: NPO Medication Administration: Via alternative means      Patient may use Passy-Muir Speech Valve: During all waking hours (remove during sleep) PMSV Supervision: Intermittent MD: Please consider changing trach tube to : Smaller size         Oral Care Recommendations: Oral care QID;Staff/trained caregiver to provide oral care Follow up Recommendations: Other (comment) (TBD) SLP Visit Diagnosis: Dysphagia, unspecified (R13.10);[place] (R49.1) Plan: MBS       Aleanna Menge I. Hardin Negus, [place], [place] Office number [phone] Pager [place] 01/19/2021, 9:17 AM

## 2021-01-19 NOTE — Progress Notes (Signed)
NAME:  Thomas [name], MRN:  [id number], DOB:  10/30/1955, LOS: 64 ADMISSION DATE:  11/30/2020, CONSULTATION DATE:  7/30 REFERRING MD:  Riccardo Dubin, CHIEF COMPLAINT:  Worsening hypoxia/dyspnea  History of Present Illness:  65 yo M admitted 7/24 for worsening sepsis vs reaction to chemotherapy with high ostomy output and soft BP on 7/25. Has had issues during his hospitalization with recurrent aspirations and more recently worsening respiratory status.  Most recently pt developed worsening hypoxia and was requiring BIPAP w/ FIO2 of 100% and increased work of breathing.   He was admitted to intensive care and subsequently had worsening clinical status requiring intubation, CRRT, multiple pressors and pneumonia along with CT findings 8/1 concerning for ischemic bowel.  Significant Hospital Events:  7/27 Transferred to [facility], stopped abx as sepsis resolved/ruled out 7/30 AM Worsened AME, more hypoxic with worsening renal funciton, new aspiration on abx restarted 7/30 PM Pt with increased o2 requirement on 100% FIO2 on BIPAP, d/w family who wanted to proceed with intubation, pt intubated 7/31 Tracheal aspirate grew moderate candida tropicalis  8/1 Some issues with tachycardia and hypotension overnight. Reproted high ostomy output.  GI PCR negative 8/2 down to 9 mcg of Levophed, however repeat CT abdomen/pelvis showed inflammatory wall thickening and fat stranding of the descending and sigmoid colon, new since prior, consistent with nonspecific infectious, inflammatory, or ischemic colitis.  Also new dense consolidative airspace opacity in the right lower and right middle lobes 8/3 further weaning NE. Weaning sedation. Full code, full scope per family meeting 8/2. Eraxis stopped (8/1-8/3) 8/4 off pressors, plt down to 103.  8/5 remains off pressors. Moving around more, not really following commands though  8/9 failed extubation 8/8, tolerated nasal cannula for about an hour, desaturations and inability to  clear secretions-reintubated 8/8 8/10 some agitation overnight 8/11 tracheostomy placement On trach collar since 8/16 8/22 pulled trach out, replaced under bronch guidance. Has proximal area of granulation tissue that is above the bottom of the trach tube.  8/25 Removed Trach, unable to reinsert, despite multiple attempts re-intubated 8/29 Anemia, 1 unit PRBC. ENT re-placed Trach in [place]. 9/8 tracheal aspirate growing pan-sensitive P. Aeruginosa. Started on Ceftazidine   Interim History / Subjective:   No acute complaints this AM. He endorses cough but is unchanged.   Objective   Blood pressure 139/72, pulse 97, temperature 97.7 F (36.5 C), temperature source Oral, resp. rate 17, height 5\' 8"  (1.727 m), weight 102.9 kg, SpO2 98 %.    FiO2 (%):  [28 %] 28 %   Intake/Output Summary (Last 24 hours) at 01/19/2021 0831 Last data filed at 01/19/2021 0511 Gross per 24 hour  Intake --  Output 2162 ml  Net -2162 ml    Filed Weights   01/17/21 1700 01/18/21 0500 01/19/21 0500  Weight: 100.3 kg 101.2 kg 102.9 kg   Exam:  General: chronically ill appearing man lying in bed in NAD HEENT: [place]/AT, eyes anicteric Neck: Trach in place Neuro: Awake, alert, globally weak however moving all extremities. Answers questions appropriately.  CV: s1s2, RRR PULM: mild rhonchi, no accessory muscle use GI: soft, NT Skin: warm, dry, no rashes.  Resolved Hospital Problem list   Shock  HHS Afib RVR  Lactic Acidosis  Thrombocytopenia Colitis  Assessment & Plan:  Acute hypoxemic respiratory failure s/p tracheostomy Endotracheal granulation tissue-  Trach revision by ENT on 8/29 to cuff-less trach Aspiration pneumonia Obstructive sleep apnea Pseudomonas positive Tracheal Aspirate culture - Previously treated. Regrowth likely represents colonization. Given lack  of fever, nor change in clinical symptoms, would not recommend treating. Will leave up to primary though.   -Continue routine trach care.   -Continue mobility and pulmonary hygiene -Ceftazidime per primary team -Will follow weekly for trach management.   Acute kidney injury on chronic kidney disease stage IIIb Hyponatremia -On intermittent hemodialysis Tuesday, Thursday, and Saturday -Continue to monitor electrolytes -strict I/Os -renally dose meds, avoid nephrotoxic meds  Colorectal cancer: Completed chemotherapy July 2022  Lower extremity DVT - Continue Eliquis  Rest per primary.   Dr. Jose Persia Internal Medicine PGY-3 01/19/2021, 8:36 AM

## 2021-01-19 NOTE — Progress Notes (Signed)
CSW spoke with Anderson Malta at [facility]	[place] who states there are no HD beds available at this time.  Madilyn Fireman, MSW, LCSW Transitions of Care  Clinical Social Worker II [phone]

## 2021-01-19 NOTE — Progress Notes (Signed)
Modified Barium Swallow Progress Note  Patient Details  Name: Thomas [name] MRN: [id number] Date of Birth: February 28, 1956  Today's Date: 01/19/2021  Modified Barium Swallow completed.  Full report located under Chart Review in the Imaging Section.  Brief recommendations include the following:  Clinical Impression  Pt presented with oral phase dysphagia characterized by oral holding, reduced bolus cohesion, impaired bolus propulsion, and impaired mastication. Pt reported attempts at posterior bolus propulsion, but limited lingual movement was noted. A posterior head tilt was ineffective in facilitating adequate A-P transport for swallowing. Limited hyolaryngeal elevation was noted with tactile stimulation to swallow, but no boluses were propelled to the pharynx despite cues. No functional benefit was noted with cued effortful swallows. It is recommended that the pt's NPO status be maintained and SLP will follow for dysphagia treatment to improve oropharyngeal function.   Swallow Evaluation Recommendations       SLP Diet Recommendations: Alternative means - temporary;NPO       Medication Administration: Via alternative means               Oral Care Recommendations: Oral care QID      Efrem Pitstick I. Hardin Negus, [place], [place] Office number [phone] Pager [place] 01/19/2021,3:20 PM

## 2021-01-19 NOTE — Progress Notes (Signed)
Pharmacy Antibiotic Note  Thomas [name] is a 65 y.o. male admitted on 11/30/2020 with pneumonia.  Pharmacy has been consulted for Ceftazidime dosing for recurrent Pseudomonas tracheobronchitis, and also for Fluconazole for oropharyngeal candidiasis.   Ceftazidime 2gm IV scheduled TTS after HD.  Given on 9/8 but dose missed on 9/10.  HD planned on schedule 9/13.    Day 4/7 Fluconazole per tube. Also on oral Nystatin QID.  Plan: Ceftazidime 1gm IV today, then continue with 2gm after HD-TTS on 9/13.  Follow up to be sure doses are given. Follow up clinical progress, duration of therapy. Fluconazole 50 mg per tube daily through 01/22/21  Height: 5\' 8"  (172.7 cm) Weight: 102.9 kg (226 lb 13.7 oz) IBW/kg (Calculated) : 68.4  Temp (24hrs), Avg:97.8 F (36.6 C), Min:97.5 F (36.4 C), Max:98.3 F (36.8 C)  Recent Labs  Lab 01/15/21 0400 01/16/21 0039 01/16/21 0041 01/17/21 0500 01/17/21 0845 01/18/21 0352 01/19/21 0230  WBC 15.1*  --  17.5*  --  14.5* 15.4* 17.8*  CREATININE 3.75* 2.88*  --  4.09*  --  2.89* 3.74*  3.71*    No Known Allergies  Antimicrobials this admission: Fluconazole 9/9 >> (9/15) Ceftazidime 9/8>> Nystatin oral 9/9>> Cefepime 7/24 >> 7/28, 7/31 >> 8/7   Vanc x1 7/25, 8/1 >> 8/2 Unasyn 7/30 >> 7/31 Flagyl 7/31 >> 8/7 Eraxis 8/1 >> 8/2 Cefazolin 8/15 >8/16 Ceftriaxone 8/16> 8/17; x 1 on 9/7 Cefepime 8/17>8/22  Microbiology results: 7/24 Blood: negative 7/25 Urine:  negative 7/25 MRSA PCR: negative 7/26 C diff: negative 7/31 Trach: candida tropicalis (likely not pathogenic) 8/1 GI panel: negative 8/15 tracheal aspirate: rare Pseudomonas, sens ceftaz, cipro, zosyn 9/7 blood: negative 9/7 tracheal aspirate: moderate Pseudomonas, sens Ceftaz, cipro, zosyn, gent (MIC < 1) 9/7 urine: multiple species  Thank you for allowing pharmacy to be a part of this patient's care.  Arty Baumgartner, [place], [place] 01/19/2021 3:18 PM

## 2021-01-19 NOTE — Progress Notes (Signed)
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 CRRT initiated 7/31 and transitioned to IHD on 12/19/20.  S/p [facility] placement 12/22/20.  Continue with TTS schedule for now.   No sign of renal recovery although he has some urine output.   Plan for next HD on 9/13.  # Volume overload/anasarca-improved with dialysis.  # Sepsis/aspiration pneumonia/VDRF:aspiration pneumonia.  Completed antibiotics course.  S/p tracheostomy, however pt removed it on 01/01/21 s/p intubation.  New tracheostomy created 01/05/21 by ENT.   # Stage IIIB colon cancer - sp partial colectomy, colostomy. On chemoRx sp 3 cycles.  # Right leg DVT: On Eliquis.  # Atrial fibrillation - on amiodarone and anticoagulation.    # Anemia of critical illness: DC  IV iron as concern for infection and on abx, transfuse as needed, holding erythropoietin with active malignancy.  Monitor hemoglobin and transfuse as needed.  #CKD-MBD; started sevelamer for hyperphosphatemia on 9/8, monitor phosphorus level.  #Disposition - poor overall prognosis.  Seen by palliative care team.  Discussion ongoing for possible transfer to LTAC/[facility].  The tracheostomy should be capped before he can be accepted to [facility]  Subjective: Seen and examined.  No acute events overnight, no complaints. Objective Vital signs in last 24 hours: Vitals:   01/18/21 2357 01/19/21 0400 01/19/21 0429 01/19/21 0500  BP:      Pulse:   (!) 103   Resp:   (!) 22   Temp: 97.8 F (36.6 C) (!) 97.5 F (36.4 C)    TempSrc: Axillary Oral    SpO2:   100%   Weight:    102.9 kg  Height:       Weight change: 0.7 kg  Intake/Output Summary (Last 24 hours) at 01/19/2021 0739 Last data filed at 01/19/2021 0511 Gross per 24 hour  Intake --  Output 2162 ml  Net -2162 ml       Labs: Basic Metabolic Panel: Recent Labs  Lab  01/17/21 0500 01/18/21 0352 01/19/21 0230  NA 132* 134* 132*  134*  K 5.0 4.4 5.3*  5.3*  CL 93* 90* 91*  93*  CO2 _0 GLUCOSE 178* 169* 192*  189*  BUN 66* 38* 67*  65*  CREATININE 4.09* 2.89* 3.74*  3.71*  CALCIUM 9.6 9.2 9.6  9.7  PHOS 8.0* 5.8* 8.2*  8.5*   Liver Function Tests: Recent Labs  Lab 01/17/21 0845 01/18/21 0352 01/19/21 0230  AST 12* 16 14*  ALT _1 ALKPHOS 103 117 109  BILITOT 0.8 0.9 0.7  PROT 8.2* 8.3* 8.1  ALBUMIN 2.7* 2.8* 2.8*  2.8*   No results for input(s): LIPASE, AMYLASE in the last 168 hours. No results for input(s): AMMONIA in the last 168 hours. CBC: Recent Labs  Lab 01/15/21 0400 01/16/21 0041 01/17/21 0845 01/18/21 0352 01/19/21 0230  WBC 15.1* 17.5* 14.5* 15.4* 17.8*  NEUTROABS 10.3* 12.8* 10.1* 10.4* 12.9*  HGB 10.1* 10.1* 9.1* 9.8* 9.7*  HCT 29.7* 31.4* 27.7* 29.3* 29.2*  MCV 85.8 89.0 87.7 86.7 87.2  PLT 324 283 282 261 278   Cardiac Enzymes: No results for input(s): CKTOTAL, CKMB, CKMBINDEX, TROPONINI in the last 168 hours. CBG: Recent Labs  Lab 01/18/21 1217 01/18/21 1620 01/18/21 1935 01/18/21 2319 01/19/21 0431  GLUCAP 208* 193* 173* 173* 173*    Iron Studies: No results for input(s): IRON, TIBC,  TRANSFERRIN, FERRITIN in the last 72 hours. Studies/Results: DG CHEST PORT 1 VIEW  Result Date: 01/18/2021 CLINICAL DATA:  Shortness of breath.  Follow-up tubes and catheters. EXAM: PORTABLE CHEST 1 VIEW COMPARISON:  01/08/2021 FINDINGS: Borderline enlarged cardiac silhouette. Tracheostomy tube in satisfactory position. Feeding tube extending into the distal stomach. Left jugular catheter tip in the inferior aspect of the superior vena cava. Right jugular porta catheter tip at the superior cavoatrial junction. Interval mild right basilar atelectasis and possible small pleural effusion. Clear left lung. Unremarkable bones. IMPRESSION: Interval mild right basilar atelectasis and possible small right  pleural effusion. Electronically Signed   By: Claudie Revering M.D.   On: 01/18/2021 09:52    Medications: Infusions:  sodium chloride Stopped (12/20/20 0955)   anticoagulant sodium citrate     cefTAZidime (FORTAZ)  IV 2 g (01/15/21 1326)   feeding supplement (JEVITY 1.5 CAL/FIBER) 1,000 mL (01/18/21 0524)    Scheduled Medications:  acetaminophen  1,000 mg Per Tube Q8H   amLODipine  5 mg Per Tube Daily   apixaban  5 mg Per Tube BID   vitamin C  500 mg Per Tube TID   atorvastatin  20 mg Per Tube Daily   chlorhexidine gluconate (MEDLINE KIT)  15 mL Mouth Rinse BID   Chlorhexidine Gluconate Cloth  6 each Topical Q0600   Chlorhexidine Gluconate Cloth  6 each Topical Q0600   clonazepam  0.5 mg Per Tube BID   feeding supplement (PROSource TF)  45 mL Per Tube BID   fiber  1 packet Per Tube BID   fluconazole  50 mg Per Tube Daily   gabapentin  100 mg Per Tube Q8H   hydrocerin   Topical BID   insulin aspart  0-15 Units Subcutaneous Q4H   insulin detemir  15 Units Subcutaneous BID   levETIRAcetam  500 mg Per Tube BID   loperamide HCl  1 mg Per Tube BID   mouth rinse  15 mL Mouth Rinse 10 times per day   multivitamin  1 tablet Per Tube QHS   nystatin  5 mL Per Tube QID   QUEtiapine  50 mg Per Tube BID   sevelamer carbonate  2.4 g Per Tube TID WC   thiamine  100 mg Per Tube Daily    have reviewed scheduled and prn medications.  Physical Exam: General: Ill-looking male, with tracheostomy on trach collar, not in distress Heart:RRR, s1s2 nl Lungs: Coarse breath sounds anteriorly Abdomen:soft, Non-tender, non-distended Extremities: No edema. Neurology: Alert awake and following commands. Dialysis Access: [facility] in place.   Silvino Selman 01/19/2021,7:39 AM  LOS: 49 days

## 2021-01-20 DIAGNOSIS — E1100 Type 2 diabetes mellitus with hyperosmolarity without nonketotic hyperglycemic-hyperosmolar coma (NKHHC): Secondary | ICD-10-CM | POA: Diagnosis not present

## 2021-01-20 LAB — CBC
HCT: 28.1 % — ABNORMAL LOW (ref 39.0–52.0)
Hemoglobin: 9.4 g/dL — ABNORMAL LOW (ref 13.0–17.0)
MCH: 28.7 pg (ref 26.0–34.0)
MCHC: 33.5 g/dL (ref 30.0–36.0)
MCV: 85.9 fL (ref 80.0–100.0)
Platelets: 339 10*3/uL (ref 150–400)
RBC: 3.27 MIL/uL — ABNORMAL LOW (ref 4.22–5.81)
RDW: 15.9 % — ABNORMAL HIGH (ref 11.5–15.5)
WBC: 15.4 10*3/uL — ABNORMAL HIGH (ref 4.0–10.5)
nRBC: 0 % (ref 0.0–0.2)

## 2021-01-20 LAB — RENAL FUNCTION PANEL
Albumin: 2.8 g/dL — ABNORMAL LOW (ref 3.5–5.0)
Anion gap: 23 — ABNORMAL HIGH (ref 5–15)
BUN: 95 mg/dL — ABNORMAL HIGH (ref 8–23)
CO2: 21 mmol/L — ABNORMAL LOW (ref 22–32)
Calcium: 9.8 mg/dL (ref 8.9–10.3)
Chloride: 90 mmol/L — ABNORMAL LOW (ref 98–111)
Creatinine, Ser: 4.32 mg/dL — ABNORMAL HIGH (ref 0.61–1.24)
GFR, Estimated: 15 mL/min — ABNORMAL LOW (ref >60)
Glucose, Bld: 158 mg/dL — ABNORMAL HIGH (ref 70–99)
Phosphorus: 10.4 mg/dL — ABNORMAL HIGH (ref 2.5–4.6)
Potassium: 5.9 mmol/L — ABNORMAL HIGH (ref 3.5–5.1)
Sodium: 134 mmol/L — ABNORMAL LOW (ref 135–145)

## 2021-01-20 LAB — GLUCOSE, CAPILLARY
Glucose-Capillary: 153 mg/dL — ABNORMAL HIGH (ref 70–99)
Glucose-Capillary: 155 mg/dL — ABNORMAL HIGH (ref 70–99)
Glucose-Capillary: 166 mg/dL — ABNORMAL HIGH (ref 70–99)
Glucose-Capillary: 166 mg/dL — ABNORMAL HIGH (ref 70–99)
Glucose-Capillary: 175 mg/dL — ABNORMAL HIGH (ref 70–99)
Glucose-Capillary: 221 mg/dL — ABNORMAL HIGH (ref 70–99)

## 2021-01-20 MED ORDER — LOPERAMIDE HCL 1 MG/7.5ML PO SUSP
1.0000 mg | Freq: Four times a day (QID) | ORAL | Status: DC
  Administered 2021-01-20 – 2021-01-28 (×17): 1 mg
  Filled 2021-01-20 (×42): qty 7.5

## 2021-01-20 NOTE — Progress Notes (Signed)
   01/20/21 1150  Vitals  Temp (!) 97.5 F (36.4 C)  Temp Source Oral  BP 117/82  MAP (mmHg) 98  BP Location Left Arm  BP Method Automatic  Patient Position (if appropriate) Lying  Pulse Rate (!) 106  Pulse Rate Source Monitor  Resp 20  Oxygen Therapy  SpO2 100 %  O2 Device Tracheostomy Collar  Dialysis Weight  Weight 102.7 kg  Type of Weight Post-Dialysis  Post-Hemodialysis Assessment  Rinseback Volume (mL) 250 mL  KECN 183 V  Dialyzer Clearance Lightly streaked  Duration of HD Treatment -hour(s) 3.5 hour(s)  Hemodialysis Intake (mL) 500 mL  UF Total -Machine (mL) 2325 mL  Net UF (mL) 1825 mL  Tolerated HD Treatment  (see progress noted, pt stable at present)  Post-Hemodialysis Comments tx complete-pt stable  Hemodialysis Catheter Left Subclavian Double lumen Permanent (Tunneled)  Placement Date/Time: 12/22/20 1141   Time Out: Correct patient;Correct site;Correct procedure  Maximum sterile barrier precautions: Hand hygiene;Cap;Mask  Site Prep: Chlorhexidine (preferred)  Local Anesthetic: Injectable - 1% Lidocaine  Ultrasound [place]...  Site Condition No complications  Blue Lumen Status Flushed;Other (Comment) (sodium citrate locked)  Red Lumen Status Flushed;Dead end cap in place;Other (Comment) (locked with sodium citrate)  Catheter fill solution 4% Sodium Citrate  Catheter fill volume (Arterial) 1.6 cc  Catheter fill volume (Venous) 1.6  Dressing Type Transparent  Dressing Status Dry;Clean;Intact  Antimicrobial disc in place? Yes  Interventions New dressing;Dressing changed;Antimicrobial disc changed  Drainage Description None  Dressing Change Due 01/27/21  Post treatment catheter status Capped and Clamped  Pt noted with periods of anxiety and restlessness. Given prn oxycodone and scheduled Klonopin by primary nurse for c/o abdominal pain and anxiety-partial effects noted upon reassessment within hour. UF paused during c/o pain while awaiting medication from primary  nurse. Uf goal=1.8 liters. Pt denies pain at time of transfer back to room, decreased anxiety noted. Stable upon transfer.

## 2021-01-20 NOTE — Progress Notes (Signed)
[facility] PROGRESS NOTE  Thomas [name] LKG:[id number] DOB: 10-Aug-1955 DOA: 11/30/2020 PCP: Thomas Mile, NP  Status: Remains inpatient appropriate because:Persistent severe electrolyte disturbances, Unsafe d/c plan, and Inpatient level of care appropriate due to severity of illness  Dispo: The patient is from: Home              Anticipated d/c is to: SNF versus LTAC              Patient currently is not medically stable to d/c.   Difficult to place patient Yes               Barriers to discharge: Currently has tracheostomy in place and is requiring dialysis but remains too weak to tolerate dialysis in chair which is a requirement for outpatient HD    Level of care: Progressive  Code Status: Full Family Communication: Patient only; attempted to contact [name] both on the home phone and the mobile phone but was unsuccessful in reaching her DVT prophylaxis: Eliquis COVID vaccination status: Moderna 07/08/2019 and 08/11/2019 with first booster dose given 08/05/2020   HPI:  65 y.o. male with a history of colon cancer s/p resection and colostomy May 2022 s/p 3 cycles chemotherapy (last was July 2022), T2DM with neuropathy, HTN, HLD who presented to the ED 7/24 with abdominal pain found to be febrile, tachycardic, tachypneic. Lactic acid was 6.6 and persisted at 6.8 after sepsis IV fluid bolus and cefepime. He had ARF, demand ischemia, bandemia, was hyperglycemic with negative ketones, with lactic acidosis. He developed AFib with RVR in the ED, was started on diltiazem, and was admitted to the ICU for septic shock with unclear source. Sepsis was subsequently felt to be ruled out at time of admission, presentation felt more likely to be related to cepacitabine toxicity with oral mucositis, hand/foot syndrome, and diarrhea/high output colostomy. He was transferred to hospitalist service 7/27 but developed metabolic encephalopathy with hyoxia due to aspiration pneumonia on 7/30 requiring  intubation and pressor support. Failed extubation with inability to clear secretions, requiring reintubation 8/8, and tracheostomy subsequently placed 8/11. This was pulled out 8/22, replaced under bronch guidance, again removed 8/25 requiring reintubation after failed reinsertion. ENT revised tracheostomy in the OR 8/29. He has required IHD for ARF, and also had complications of RLE DVT now on eliquis, colitis thought to be ischemic 8/2 not requiring specific intervention, Pseudomonas pneumonia having completed treatment, agitated encephalopathy which has improved, anemia requiring 4u PRBCs total, and thrombocytopenia which has resolved. On 9/4 he was transferred to hospitalist service having tolerated trach collar.  Subjective: Examined during dialysis.  Alert and wanted to know when his dialysis will be completed.   Objective: Vitals:   01/20/21 0435 01/20/21 0724  BP: 136/69   Pulse: 100 99  Resp: 16 15  Temp: 97.8 F (36.6 C)   SpO2: 98% 98%    Intake/Output Summary (Last 24 hours) at 01/20/2021 0728 Last data filed at 01/19/2021 2230 Gross per 24 hour  Intake 0 ml  Output --  Net 0 ml   Filed Weights   01/18/21 0500 01/19/21 0500 01/20/21 0435  Weight: 101.2 kg 102.9 kg 106.8 kg    Exam: Constitutional: NAD, calm, comfortable Respiratory: #6 XLT uncuffed trach 9/9; 28% trach collar, clear to auscultation.  Lungs clear to auscultation, no secretions noted around trach.  Patient able to phonate around trach. Cardiovascular: Heart sounds S1-S2, minimal peripheral edema, no JVD, regular pulse, normotensive Abdomen: Soft and nontender, Bowel sounds positive.  LBM 9/11; colostomy with large volume liquid stool; cortrack for tube feedings and medication Neurologic: CN 2-12 grossly intact. Sensation intact, Strength 3/5 x all 4 extremities.  Psychiatric: Appears to have normal judgment and insight. Alert and oriented x 3.    Assessment/Plan: Acute problems:  Acute hypoxic  respiratory failure s/p tracheostomy due to aspiration pneumonia  Recurrent pansensitive Pseudomonas tracheobronchitis -Tolerating PMV.  Trach team wants to make sure patient more mobile with stronger vocalization before trying capping trials. -Continue Tressie  on TTS schedule with pharmacy dosing -of note patient did miss a dose over the weekend with pharmacy adjusting and adding an extra dosing -ENT changed to cuffless trach on 9/9  Oral Candida -Continue Diflucan x 6 doses total   Acute renal failure on stage IIIb CKD: Presumed ischemic ATN -started CRRT 7/31 > iHD 8/12 s/p left IJ [facility] 8/15.   - Continue HD TTS thru [facility] [place] per nephrology.  Patient needs to tolerate sitting up in chair for dialysis treatments --Do not have permanent access -Continue OOB to chair TID-as he improves with mobility with PT will eventually do a trial run of chair dialysis to replicate outpatient requirements -Not expected to recover renal function therefore will be on permanent HD   High output ostomy, diarrhea:  -Negative C. diff PCR (7/26) and GI pathogen panel (8/1). Possibly ischemic colitis on CT 8/2-need to maintain blood pressure during dialysis treatment -9/13 continues with high volume liquid stool therefore will increase Imodium to qid and continue fiber supplementation-hopefully when able to eat regular food diarrhea will resolve. -Will DC PPI twice daily since this class of drug can promote diarrhea   Anemia of critical illness, acute blood loss as well as anemia of chronic disease and iron deficiency:  - s/p 4u PRBCs this hospitalization  - Getting IV iron TTS x8 doses per nephrology. 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 Benay Spice. Started CAPOX June 2022, reduced cepecitabine dose starting 11/19/2020 due to hand/foot syndrome, reduced oxaliplatin due to renal impairment and  poor performance status.  - Dr. Benay Spice has followed during this hospitalization. Per note 8/2 "[pt] is undergoing curative therapy for colon cancer..." - Palliative care has been involved during this hospitalization, though goals remain clear: full aggressive measures desired.    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  OT/PT note that if patient continues to make progress likely would be a candidate for CIR   Acute extensive right leg DVT:  Involving CFV, SF junction, proximal femoral and profunda veins and popliteal veins. Dx 8/5.  Did not have significant edema therefore did not require thrombolysis - Continue eliquis   New onset atrial fibrillation with RVR:  -Currently maintaining sinus rhythm w/o meds - Continue eliquis as above   Seizure disorder:  - Continue keppra  Hand/foot syndrome related to xeloda:  -Dose reduced in the outpatient setting and given acuity of current symptoms this medication has not been utilized during hospitalization - No infection noted at this time. Will continue moisturizing topical Tx   Acute metabolic encephalopathy, agitated delirium:  -Resolved - Continue clonazepam 0.74m per tube BID and prn-taper and DC as tolerated - Continue quetiapine 536mBID-taper and DC as tolerated   Uncontrolled T2DM on  OHAs prior to admission with associated neuropathy:  HbA1c 8.7% at admission. - CBGs well-controlled currently, continue levemir 15u BID and SSI q4h.  - Continue gabapentin 10056m8h.   HTN:  - Continue norvasc   HLD:  - Continue atorvastatin   Thrombocytopenia:  -Transient likely due to critical illness. HIT Ab negative on 8/5.    Stress ulcer prophylaxis -Continue PPI but consider discontinuing since this medication can contribute to loose stools and diarrhea   ?Sepsis - initially thought to be present on admission- has been ruled out.     Data Reviewed: Basic Metabolic Panel: Recent Labs  Lab 01/14/21 0906 01/15/21 0400 01/16/21 0039 01/17/21 0500 01/17/21 0845 01/18/21 0352 01/19/21 0230 01/20/21 0516  NA  --    < > 134* 132*  --  134* 132*  134* 134*  K  --    < > 4.5 5.0  --  4.4 5.3*  5.3* 5.9*  CL  --    < > 93* 93*  --  90* 91*  93* 90*  CO2  --    < > 23 22  --  _0 21*  GLUCOSE  --    < > 177* 178*  --  169* 192*  189* 158*  BUN  --    < > 36* 66*  --  38* 67*  65* 95*  CREATININE  --    < > 2.88* 4.09*  --  2.89* 3.74*  3.71* 4.32*  CALCIUM  --    < > 9.5 9.6  --  9.2 9.6  9.7 9.8  MG 1.9  --   --   --  2.1 2.1 2.1  --   PHOS 5.8*   < > 5.2* 8.0*  --  5.8* 8.2*  8.5* 10.4*   < > = values in this interval not displayed.   Liver Function Tests: Recent Labs  Lab 01/17/21 0500 01/17/21 0845 01/18/21 0352 01/19/21 0230 01/20/21 0516  AST  --  12* 16 14*  --   ALT  --  _1 --   ALKPHOS  --  103 117 109  --   BILITOT  --  0.8 0.9 0.7  --   PROT  --  8.2* 8.3* 8.1  --   ALBUMIN 2.7* 2.7* 2.8* 2.8*  2.8* 2.8*    CBC: Recent Labs  Lab 01/15/21 0400 01/16/21 0041 01/17/21 0845 01/18/21 0352 01/19/21 0230  WBC 15.1* 17.5* 14.5* 15.4* 17.8*  NEUTROABS 10.3* 12.8* 10.1* 10.4* 12.9*  HGB 10.1* 10.1* 9.1* 9.8* 9.7*  HCT 29.7* 31.4* 27.7* 29.3* 29.2*  MCV 85.8 89.0 87.7 86.7 87.2  PLT 324 283 282 261 278    CBG: Recent  Labs  Lab 01/19/21 1204 01/19/21 1600 01/19/21 1940 01/20/21 0150 01/20/21 0433  GLUCAP 212* 185* 174* 166* 155*    Scheduled Meds:  acetaminophen  1,000 mg Per Tube Q8H   amLODipine  5 mg Per Tube Daily   apixaban  5 mg Per Tube BID   vitamin C  500 mg Per Tube TID   atorvastatin  20 mg Per Tube Daily   chlorhexidine gluconate (MEDLINE KIT)  15 mL Mouth Rinse BID   Chlorhexidine Gluconate Cloth  6 each Topical Q0600   Chlorhexidine Gluconate Cloth  6 each Topical Q0600   clonazepam  0.5 mg Per Tube BID   feeding supplement (PROSource TF)  45 mL Per Tube BID   fiber  1 packet Per Tube BID   fluconazole  50 mg Per Tube Daily   gabapentin  100 mg Per Tube Q8H   hydrocerin   Topical BID   insulin aspart  0-15 Units Subcutaneous Q4H   insulin detemir  15 Units Subcutaneous BID   levETIRAcetam  500 mg Per Tube BID   loperamide HCl  1 mg Per Tube BID   mouth rinse  15 mL Mouth Rinse 10 times per day   multivitamin  1 tablet Per Tube QHS   nystatin  5 mL Per Tube QID   QUEtiapine  50 mg Per Tube BID   sevelamer carbonate  2.4 g Per Tube TID WC   thiamine  100 mg Per Tube Daily   Continuous Infusions:  sodium chloride Stopped (12/20/20 0955)   anticoagulant sodium citrate     cefTAZidime (FORTAZ)  IV 2 g (01/15/21 1326)   feeding supplement (JEVITY 1.5 CAL/FIBER) 1,000 mL (01/18/21 0524)    Principal Problem:   Diabetes mellitus with hyperosmolarity without hyperglycemic hyperosmolar nonketotic coma (HCC) Active Problems:   Lactic acidosis   Acute renal failure superimposed on stage 2 chronic kidney disease (HCC)   Metabolic acidosis   Tachycardia   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 (HCC)   Adverse effect of chemotherapy   Pressure injury of skin   Atrial fibrillation with RVR ([place])   Sepsis ([place])   Malnutrition of moderate degree   Acute metabolic encephalopathy   Acute respiratory failure with hypoxemia (HCC)   Acute renal failure with  acute cortical necrosis ([place])   Shock ([place])   Status post tracheostomy ([place])   Endotracheal tube present   Hemodialysis status [facility])   Palliative care by specialist   Consultants: PCCM General surgery Nephrology Hematology/oncology Palliative care medicine team  Procedures: 2u PRBC 7/31, 1u PRBC 8/17, 1u PRBC 8/27 Tracheostomy revision 01/05/2021 Dr. Constance Holster    Antibiotics: Vancomycin 7/24 Cefepime 7/24 - 7/27 Unasyn 7/30 >> cefepime/flagyl 7/31 - 8/6     Time spent: 40 minutes    [name] ANP  [facility] 7 am - 330 pm/M-F for direct patient care and secure chat Please refer to Amion for contact info 50  days

## 2021-01-20 NOTE — Progress Notes (Signed)
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 CRRT initiated 7/31 and transitioned to IHD on 12/19/20.  S/p [facility] placement 12/22/20.  Continue with TTS schedule for now.   No sign of renal recovery, no uop recorded.   HD today.  # Volume overload/anasarca-improved with dialysis.  # Sepsis/aspiration pneumonia/VDRF:aspiration pneumonia.  Completed antibiotics course.  S/p tracheostomy, however pt removed it on 01/01/21 s/p intubation.  New tracheostomy created 01/05/21 by ENT.   # Stage IIIB colon cancer - sp partial colectomy, colostomy. On chemoRx sp 3 cycles.  # Right leg DVT: On Eliquis.  # Atrial fibrillation - on amiodarone and anticoagulation.    # Anemia of critical illness: DC  IV iron as concern for infection and on abx, transfuse as needed, holding erythropoietin with active malignancy.  Monitor hemoglobin and transfuse as needed.  #CKD-MBD; started sevelamer for hyperphosphatemia on 9/8, monitor phosphorus level.  #Disposition - poor overall prognosis.  Seen by palliative care team.  Discussion ongoing for possible transfer to [facility].  The tracheostomy should be capped before he can be accepted to [facility]  Subjective: Seen and examined bedside this AM.  No acute events overnight, no complaints. Objective Vital signs in last 24 hours: Vitals:   01/20/21 0018 01/20/21 0435 01/20/21 0724 01/20/21 0745  BP: 133/66 136/69    Pulse:  100 99   Resp:  _0 Temp: 99.2 F (37.3 C) 97.8 F (36.6 C)    TempSrc: Oral Oral    SpO2:  98% 98%   Weight:  106.8 kg    Height:       Weight change: 3.9 kg  Intake/Output Summary (Last 24 hours) at 01/20/2021 0812 Last data filed at 01/19/2021 2230 Gross per 24 hour  Intake 0 ml  Output --  Net 0 ml       Labs: Basic Metabolic Panel: Recent Labs  Lab 01/18/21 0352 01/19/21 0230  01/20/21 0516  NA 134* 132*  134* 134*  K 4.4 5.3*  5.3* 5.9*  CL 90* 91*  93* 90*  CO2 _1 21*  GLUCOSE 169* 192*  189* 158*  BUN 38* 67*  65* 95*  CREATININE 2.89* 3.74*  3.71* 4.32*  CALCIUM 9.2 9.6  9.7 9.8  PHOS 5.8* 8.2*  8.5* 10.4*   Liver Function Tests: Recent Labs  Lab 01/17/21 0845 01/18/21 0352 01/19/21 0230 01/20/21 0516  AST 12* 16 14*  --   ALT _2 --   ALKPHOS 103 117 109  --   BILITOT 0.8 0.9 0.7  --   PROT 8.2* 8.3* 8.1  --   ALBUMIN 2.7* 2.8* 2.8*  2.8* 2.8*   No results for input(s): LIPASE, AMYLASE in the last 168 hours. No results for input(s): AMMONIA in the last 168 hours. CBC: Recent Labs  Lab 01/16/21 0041 01/17/21 0845 01/18/21 0352 01/19/21 0230 01/20/21 0516  WBC 17.5* 14.5* 15.4* 17.8* 15.4*  NEUTROABS 12.8* 10.1* 10.4* 12.9*  --   HGB 10.1* 9.1* 9.8* 9.7* 9.4*  HCT 31.4* 27.7* 29.3* 29.2* 28.1*  MCV 89.0 87.7 86.7 87.2 85.9  PLT 283 282 261 278 339   Cardiac Enzymes: No results for input(s): CKTOTAL, CKMB, CKMBINDEX, TROPONINI in the last 168 hours. CBG: Recent Labs  Lab 01/19/21 1204 01/19/21 1600 01/19/21 1940 01/20/21 0150 01/20/21 0433  GLUCAP 212* 185* 174*  166* 155*    Iron Studies: No results for input(s): IRON, TIBC, TRANSFERRIN, FERRITIN in the last 72 hours. Studies/Results: DG Swallowing Func-Speech Pathology  Result Date: 01/19/2021 Table formatting from the original result was not included. Objective Swallowing Evaluation: Type of Study: MBS-Modified Barium Swallow Study  Patient Details Name: TAINO MAERTENS MRN: [id number] Date of Birth: [birth year] Today's Date: 01/19/2021 Time: SLP Start Time (ACUTE ONLY): 1343 -SLP Stop Time (ACUTE ONLY): 1404 SLP Time Calculation (min) (ACUTE ONLY): 21 min Past Medical History: Past Medical History: Diagnosis Date  Acute ischemic left MCA stroke ([place]) 04/09/2014  Acute respiratory failure with hypoxia ([place]) 04/09/2014  Aspiration pneumonia ([place])   Asthma   as a  child  Brain tumor ([place])   frontal  Cancer ([place])   Confusion   occasionally  Diabetes mellitus without complication ([place])   takes Metformin daily.  Dizziness   Dyspnea   pt states d/t weight  Epilepsy ([place])   takes Keppra daily  GERD (gastroesophageal reflux disease)   takes Omeprazole daily  Headache   Hyperlipidemia   takes Atorvastatin daily  Hypertension   takes Lotrel daily  Peripheral edema   takes Lasix daily  Peripheral neuropathy   takes Gabapentin daily  Pneumonia 3 yrs ago  hx of  Seizures ([place])   Sleep apnea   Urinary frequency  Past Surgical History: Past Surgical History: Procedure Laterality Date  BIOPSY  09/05/2020  Procedure: BIOPSY;  Surgeon: Irene Shipper, MD;  Location[facility] ENDOSCOPY;  Service: Endoscopy;;  CARDIAC CATHETERIZATION  7 yrs ago  COLONOSCOPY WITH PROPOFOL N/A 09/05/2020  Procedure: COLONOSCOPY WITH PROPOFOL;  Surgeon: Irene Shipper, MD;  Location: [facility] ENDOSCOPY;  Service: Endoscopy;  Laterality: N/A;  CRANIOTOMY N/A 09/01/2016  Procedure: CRANIOTOMY TUMOR  LEFT PTERIONAL;  Surgeon: Ashok Pall, MD;  Location: [place];  Service: Neurosurgery;  Laterality: N/A;  cyst removed from chest     as a child  IR FLUORO GUIDE CV LINE LEFT  12/22/2020  IR US GUIDE VASC ACCESS LEFT  12/22/2020  NO PAST SURGERIES    PARTIAL COLECTOMY N/A 09/10/2020  Procedure: TRANSVERSE COLECTOMY;  Surgeon: [name], MD;  Location: [place];  Service: General;  Laterality: N/A;  POLYPECTOMY  09/05/2020  Procedure: POLYPECTOMY;  Surgeon: Irene Shipper, MD;  Location: [place];  Service: Endoscopy;;  PORTACATH PLACEMENT Right 10/01/2020  Procedure: INSERTION PORT-A-CATH;  Surgeon: [name], MD;  Location: [place];  Service: General;  Laterality: Right;  SUBMUCOSAL TATTOO INJECTION  09/05/2020  Procedure: SUBMUCOSAL TATTOO INJECTION;  Surgeon: Irene Shipper, MD;  Location: [place];  Service: Endoscopy;;  TRACHEOSTOMY TUBE PLACEMENT N/A 01/05/2021  Procedure: TRACHEOSTOMY;  Surgeon: [name], MD;  Location:  [place];  Service: ENT;  Laterality: N/A; HPI: Pt is a 65 y/o male admitted on 7/25 for worsening sepsis vs reaction to chemotherapy with high ostomy output and soft BP. Pt developed worsening hypoxia and was requiring BIPAP w/ FIO2 of 100% and increased work of breathing. SLP consulted due to concern for aspiration. BSE 7/28 recommended NPO status and MBS, but this could not completed subsequently due to lethargy. CXR in AM of 7/30 showed some worsening concern of aspiration. ETT 7/30-8/8; tolerated nasal cannula for about an hour; reintubated 8/8-trach 8/11. Pt pulled out trach x2 8/22; replaced by PCCM. Bronch on 8/22 revealed endotracheal granulation tissue at trach site. Trach removed again on 8/25; unable to reinsert despite multiple attenpts; pt reintubated 8/25- trach by ENT on 8/29.  BSE 8/22 limited to ice chips due to pt's refusal of other consistencies; rx NPO status with Cortrak. 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  Subjective: upright in bed, requires cueing Assessment / Plan / Recommendation CHL IP CLINICAL IMPRESSIONS 01/19/2021 Clinical Impression Pt presented with oral phase dysphagia characterized by oral holding, reduced bolus cohesion, impaired bolus propulsion, and impaired mastication. Pt reported attempts at posterior bolus propulsion, but limited lingual movement was noted. A posterior head tilt was ineffective in facilitating adequate A-P transport for swallowing. Limited hyolaryngeal elevation was noted with tactile stimulation to swallow, but no boluses were propelled to the pharynx despite cues. No functional benefit was noted with cued effortful swallows. It is recommended that the pt's NPO status be maintained and SLP will  follow for dysphagia treatment to improve oropharyngeal function. SLP Visit Diagnosis Dysphagia, oropharyngeal phase (R13.12) Attention and concentration deficit following -- Frontal lobe and executive function deficit following -- Impact on safety and function Severe aspiration risk;Risk for inadequate nutrition/hydration   CHL IP TREATMENT RECOMMENDATION 01/19/2021 Treatment Recommendations Therapy as outlined in treatment plan below   Prognosis 01/19/2021 Prognosis for Safe Diet Advancement Fair Barriers to Reach Goals Time post onset;Severity of deficits Barriers/Prognosis Comment -- CHL IP DIET RECOMMENDATION 01/19/2021 SLP Diet Recommendations Alternative means - temporary;NPO Liquid Administration via -- Medication Administration Via alternative means Compensations -- Postural Changes --   CHL IP OTHER RECOMMENDATIONS 01/19/2021 Recommended Consults -- Oral Care Recommendations Oral care QID Other Recommendations --   CHL IP FOLLOW UP RECOMMENDATIONS 01/19/2021 Follow up Recommendations Skilled Nursing facility   [facility] IP FREQUENCY AND DURATION 01/19/2021 Speech Therapy Frequency (ACUTE ONLY) min 2x/week Treatment Duration 2 weeks      CHL IP ORAL PHASE 01/19/2021 Oral Phase Impaired Oral - Pudding Teaspoon -- Oral - Pudding Cup -- Oral - Honey Teaspoon -- Oral - Honey Cup -- Oral - Nectar Teaspoon -- Oral - Nectar Cup -- Oral - Nectar Straw -- Oral - Thin Teaspoon Holding of bolus;Reduced posterior propulsion Oral - Thin Cup -- Oral - Thin Straw -- Oral - Puree Reduced posterior propulsion;Holding of bolus Oral - Mech Soft -- Oral - Regular Impaired mastication Oral - Multi-Consistency -- Oral - Pill -- Oral Phase - Comment --  CHL IP PHARYNGEAL PHASE 01/19/2021 Pharyngeal Phase Impaired Pharyngeal- Pudding Teaspoon -- Pharyngeal -- Pharyngeal- Pudding Cup -- Pharyngeal -- Pharyngeal- Honey Teaspoon -- Pharyngeal -- Pharyngeal- Honey Cup -- Pharyngeal -- Pharyngeal- Nectar Teaspoon -- Pharyngeal -- Pharyngeal- Nectar  Cup -- Pharyngeal -- Pharyngeal- Nectar Straw -- Pharyngeal -- Pharyngeal- Thin Teaspoon Reduced laryngeal elevation Pharyngeal -- Pharyngeal- Thin Cup Reduced laryngeal elevation Pharyngeal -- Pharyngeal- Thin Straw -- Pharyngeal -- Pharyngeal- Puree Reduced laryngeal elevation Pharyngeal -- Pharyngeal- Mechanical Soft -- Pharyngeal -- Pharyngeal- Regular Reduced laryngeal elevation Pharyngeal -- Pharyngeal- Multi-consistency -- Pharyngeal -- Pharyngeal- Pill -- Pharyngeal -- Pharyngeal Comment --  CHL IP CERVICAL ESOPHAGEAL PHASE 01/19/2021 Cervical Esophageal Phase (No Data) Pudding Teaspoon -- Pudding Cup -- Honey Teaspoon -- Honey Cup -- Nectar Teaspoon -- Nectar Cup -- Nectar Straw -- Thin Teaspoon -- Thin Cup -- Thin Straw -- Puree -- Mechanical Soft -- Regular -- Multi-consistency -- Pill -- Cervical Esophageal Comment -- Shanika I. Hardin Negus, [place], [place] Office number [phone] Pager [phone] Tobie Poet  Ronne Binning 01/19/2021, 3:22 PM               Medications: Infusions:  sodium chloride Stopped (12/20/20 0955)   anticoagulant sodium citrate     cefTAZidime (FORTAZ)  IV 2 g (01/15/21 1326)   feeding supplement (JEVITY 1.5 CAL/FIBER) 1,000 mL (01/18/21 0524)    Scheduled Medications:  acetaminophen  1,000 mg Per Tube Q8H   amLODipine  5 mg Per Tube Daily   apixaban  5 mg Per Tube BID   vitamin C  500 mg Per Tube TID   atorvastatin  20 mg Per Tube Daily   chlorhexidine gluconate (MEDLINE KIT)  15 mL Mouth Rinse BID   Chlorhexidine Gluconate Cloth  6 each Topical Q0600   Chlorhexidine Gluconate Cloth  6 each Topical Q0600   clonazepam  0.5 mg Per Tube BID   feeding supplement (PROSource TF)  45 mL Per Tube BID   fiber  1 packet Per Tube BID   fluconazole  50 mg Per Tube Daily   gabapentin  100 mg Per Tube Q8H   hydrocerin   Topical BID   insulin aspart  0-15 Units Subcutaneous Q4H   insulin detemir  15 Units Subcutaneous BID   levETIRAcetam  500 mg Per  Tube BID   loperamide HCl  1 mg Per Tube BID   mouth rinse  15 mL Mouth Rinse 10 times per day   multivitamin  1 tablet Per Tube QHS   nystatin  5 mL Per Tube QID   QUEtiapine  50 mg Per Tube BID   sevelamer carbonate  2.4 g Per Tube TID WC   thiamine  100 mg Per Tube Daily    have reviewed scheduled and prn medications.  Physical Exam: General: Ill-looking male, with tracheostomy on trach collar, not in distress Heart:RRR, s1s2 nl Lungs: normal wob Abdomen:soft, Non-tender, non-distended Extremities: No edema. Neurology: Alert awake and following commands. Dialysis Access: [facility] in place.   Thomas [name] 01/20/2021,8:12 AM  LOS: 50 days

## 2021-01-20 NOTE — Progress Notes (Signed)
Occupational Therapy Treatment Patient Details Name: Thomas [name] MRN: [id number] DOB: 1955-05-11 Today's Date: 01/20/2021   History of present illness 65 y.o. male present to ED 7/24 with high ostomy output. Patient admitted with severe sepsis and multisystem organ failure. Hospital admission complicated by volume overload, aspiration pneumonia, acute respiratory failure, AKI and ischemic colitis. Intubated 7/30. S/p trach placement 8/11. CorTrak 8/12. HD initiated 8/12. To IR 8/15 for tunneled HD cath. Pulled out trach x2 8/22 replaced by PCCM. Pulled out trach again 8/25 but unable to be reinserted with pt intubated. 8/29 to OR for stoma clean up and trach replacement.Trach downsized 9/10.  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   OT comments  Patient had returned from HD and was supine in bed complaining of back pain. Patient performed sitting up in bed and rolling side to side to relieve pain.  Patient was provided HEP for UE ROM exercises and instructed on each exercise.  Patient demonstrated improvement with BUE elbow flexion and extension and performed therapy band exercises for strengthening.  Acute OT to continue to follow.    Recommendations for follow up therapy are one component of a multi-disciplinary discharge planning process, led by the attending physician.  Recommendations may be updated based on patient status, additional functional criteria and insurance authorization.    Follow Up Recommendations  SNF;Supervision/Assistance - 24 hour    Equipment Recommendations  Other (comment)    Recommendations for Other Services      Precautions / Restrictions Precautions Precautions: Fall Precaution Comments: cortrak, R subclavian tunneled HD cath, ostomy, O2 via trach  collar       Mobility Bed Mobility Overal bed mobility: Needs Assistance Bed Mobility: Rolling Rolling: Mod assist         General bed mobility comments: Assisted patient in bed mobility to address back pain    Transfers                      Balance Overall balance assessment: Needs assistance                                         ADL either performed or assessed with clinical judgement   ADL                                               Vision       Perception     Praxis      Cognition Arousal/Alertness: Awake/alert Behavior During Therapy: [facility] for tasks assessed/performed;Anxious Overall Cognitive Status: Within Functional Limits for tasks assessed                                 General Comments: followed instructions for HEP        Exercises Exercises: General Upper Extremity General Exercises - Upper Extremity Shoulder Flexion: AROM;Both;Supine;10 reps Shoulder ABduction: AROM;Both;10 reps;Supine Shoulder ADduction: AROM;Both;10 reps;Supine Elbow Flexion: Strengthening;Both;10 reps;Supine;Theraband Theraband Level (Elbow Flexion): Level 1 (Yellow) Elbow Extension: Strengthening;Both;10 reps;Supine;Theraband Theraband Level (Elbow Extension): Level 1 (Yellow)  Shoulder Instructions       General Comments      Pertinent Vitals/ Pain       Pain Assessment: Faces Faces Pain Scale: Hurts whole lot Pain Location: back Pain Descriptors / Indicators: Aching;Moaning;Grimacing Pain Intervention(s): Repositioned;Patient requesting pain meds-RN notified  Home Living                                          Prior Functioning/Environment              Frequency  Min 2X/week        Progress Toward Goals  OT Goals(current goals can now be found in the care plan section)  Progress towards OT goals: Progressing toward goals  Acute Rehab OT Goals Patient Stated  Goal: to decrease pain OT Goal Formulation: With patient Time For Goal Achievement: 01/20/21 Potential to Achieve Goals: Good ADL Goals Pt Will Perform Grooming: with min assist;sitting Pt Will Perform Upper Body Dressing: with mod assist;sitting Pt Will Perform Lower Body Dressing: with max assist;sitting/lateral leans;sit to/from stand Pt Will Transfer to Toilet: with supervision;ambulating;bedside commode Pt Will Perform Toileting - Clothing Manipulation and hygiene: with supervision;sit to/from stand Pt Will Perform Tub/Shower Transfer: Tub transfer;Shower transfer;3 in Energy manager will Perform Home Exercise Program: Increased ROM;Increased strength;Both right and left upper extremity;Independently Additional ADL Goal #1: Patient will maintain static sitting balance at EOB for 15 min with supervision A in prep for ADLs. Additional ADL Goal #2: [place] will complete sit to stand transfers with Mod A +2 in prep for ADLs. Additional ADL Goal #3: Patient will attend to 2/3 grooming tasks without cues for continuation indicating increased attention to task.  Plan Discharge plan remains appropriate    Co-evaluation                 AM-PAC OT "6 Clicks" Daily Activity     Outcome Measure   Help from another person eating meals?: Total Help from another person taking care of personal grooming?: A Lot Help from another person toileting, which includes using toliet, bedpan, or urinal?: A Lot Help from another person bathing (including washing, rinsing, drying)?: A Lot Help from another person to put on and taking off regular upper body clothing?: Total Help from another person to put on and taking off regular lower body clothing?: Total 6 Click Score: 9    End of Session Equipment Utilized During Treatment: Oxygen  OT Visit Diagnosis: Unsteadiness on feet (R26.81);Muscle weakness (generalized) (M62.81);Pain Pain - part of body:  (back)   Activity Tolerance  Patient tolerated treatment well   Patient Left in bed;with call bell/phone within reach;with bed alarm set;with nursing/sitter in room   Nurse Communication Patient requests pain meds        Time: 4287-6811 OT Time Calculation (min): 24 min  Charges: OT Treatments $Therapeutic Exercise: 23-37 mins  Lodema Hong, OTA   Michala Deblanc Alexis Goodell 01/20/2021, 1:22 PM

## 2021-01-21 DIAGNOSIS — E1100 Type 2 diabetes mellitus with hyperosmolarity without nonketotic hyperglycemic-hyperosmolar coma (NKHHC): Secondary | ICD-10-CM | POA: Diagnosis not present

## 2021-01-21 LAB — RENAL FUNCTION PANEL
Albumin: 2.9 g/dL — ABNORMAL LOW (ref 3.5–5.0)
Anion gap: 20 — ABNORMAL HIGH (ref 5–15)
BUN: 58 mg/dL — ABNORMAL HIGH (ref 8–23)
CO2: 25 mmol/L (ref 22–32)
Calcium: 9.3 mg/dL (ref 8.9–10.3)
Chloride: 89 mmol/L — ABNORMAL LOW (ref 98–111)
Creatinine, Ser: 3.2 mg/dL — ABNORMAL HIGH (ref 0.61–1.24)
GFR, Estimated: 21 mL/min — ABNORMAL LOW (ref >60)
Glucose, Bld: 187 mg/dL — ABNORMAL HIGH (ref 70–99)
Phosphorus: 7.9 mg/dL — ABNORMAL HIGH (ref 2.5–4.6)
Potassium: 4.5 mmol/L (ref 3.5–5.1)
Sodium: 134 mmol/L — ABNORMAL LOW (ref 135–145)

## 2021-01-21 LAB — GLUCOSE, CAPILLARY
Glucose-Capillary: 184 mg/dL — ABNORMAL HIGH (ref 70–99)
Glucose-Capillary: 185 mg/dL — ABNORMAL HIGH (ref 70–99)
Glucose-Capillary: 189 mg/dL — ABNORMAL HIGH (ref 70–99)
Glucose-Capillary: 194 mg/dL — ABNORMAL HIGH (ref 70–99)
Glucose-Capillary: 201 mg/dL — ABNORMAL HIGH (ref 70–99)
Glucose-Capillary: 206 mg/dL — ABNORMAL HIGH (ref 70–99)

## 2021-01-21 LAB — HEPATITIS B SURFACE ANTIBODY, QUANTITATIVE: Hep B S AB Quant (Post): 4.5 m[IU]/mL — ABNORMAL LOW (ref >9.9)

## 2021-01-21 NOTE — Progress Notes (Signed)
PROGRESS NOTE    Thomas [name]  RAX:[id number] DOB: 04-20-1956 DOA: 11/30/2020 PCP: [name], NP    Brief Narrative:  Thomas [name] is a 65 y.o. male with a history of colon cancer s/p resection and colostomy May 2022 s/p 3 cycles chemotherapy (last was July 2022), T2DM with neuropathy, HTN, HLD who presented to the ED 7/24 with abdominal pain found to be febrile, tachycardic, tachypneic. Lactic acid was 6.6 and persisted at 6.8 after sepsis IV fluid bolus and cefepime. He had ARF, demand ischemia, bandemia, was hyperglycemic with negative ketones, with lactic acidosis. He developed AFib with RVR in the ED, was started on diltiazem, and was admitted to the ICU for septic shock with unclear source. Sepsis was subsequently felt to be ruled out at time of admission, presentation felt more likely to be related to cepacitabine toxicity with oral mucositis, hand/foot syndrome, and diarrhea/high output colostomy.   He was transferred to hospitalist service 7/27 but developed metabolic encephalopathy with hyoxia due to aspiration pneumonia on 7/30 requiring intubation and pressor support. Failed extubation with inability to clear secretions, requiring reintubation 8/8, and tracheostomy subsequently placed 8/11. This was pulled out 8/22, replaced under bronch guidance, again removed 8/25 requiring reintubation after failed reinsertion. ENT revised tracheostomy in the OR 8/29. He has required IHD for ARF, and also had complications of RLE DVT now on eliquis, colitis thought to be ischemic 8/2 not requiring specific intervention, Pseudomonas pneumonia having completed treatment, agitated encephalopathy which has improved, anemia requiring 4u PRBCs total, and thrombocytopenia which has resolved.   On 9/4 he was transferred to hospitalist service having tolerated trach collar.   Assessment & Plan:   Principal Problem:   Diabetes mellitus with hyperosmolarity without hyperglycemic hyperosmolar nonketotic coma  (HCC) Active Problems:   Lactic acidosis   Acute renal failure superimposed on stage 2 chronic kidney disease (HCC)   Metabolic acidosis   Tachycardia   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 (HCC)   Adverse effect of chemotherapy   Pressure injury of skin   Atrial fibrillation with RVR (HCC)   Sepsis (HCC)   Malnutrition of moderate degree   Acute metabolic encephalopathy   Acute respiratory failure with hypoxemia (HCC)   Acute renal failure with acute cortical necrosis (HCC)   Shock ([place])   Status post tracheostomy ([place])   Endotracheal tube present   Hemodialysis status ([place])   Palliative care by specialist   Acute hypoxic respiratory failure s/p tracheostomy due to aspiration pneumonia  Recurrent pansensitive Pseudomonas tracheobronchitis --continue SLP, tolerating PMV.   --Continue Fortaz on TTS schedule with pharmacy dosing  --ENT changed to cuffless trach on 9/9   Oral Candida --Continue Diflucan x 6 doses   Acute renal failure on stage IIIb CKD: Presumed ischemic ATN --Nephrology following, appreciate assistance --started CRRT 7/31 > iHD 8/12 s/p left IJ [facility] 8/15.   --Continue HD TTS thru L IJ TDC per nephrology.  Patient needs to tolerate sitting up in chair for dialysis treatments --Do not have permanent access --Not expected to recover renal function therefore will be on permanent HD   High output ostomy, diarrhea:  --Negative C. diff PCR (7/26) and GI pathogen panel (8/1). Possibly ischemic colitis on CT 8/2-need to maintain blood pressure during dialysis treatment --PPI discontinued --continue to monitor ostomy output   Anemia of critical illness, acute blood loss as well as anemia of chronic disease and iron deficiency:  --s/p 4u PRBCs this hospitalization  --Getting IV iron  TTS x8 doses per nephrology. --Current hemoglobin stable at 10.6 with normal platelets   Stage IIIb colon CA s/p partial transverse colectomy with end colostomy  May 2022:  Followed by Dr. Benay Spice. Started CAPOX June 2022, reduced cepecitabine dose starting 11/19/2020 due to hand/foot syndrome, reduced oxaliplatin due to renal impairment and poor performance status. Per note 8/2 "[pt] is undergoing curative therapy for colon cancer..." --Palliative care has been involved during this hospitalization, though goals remain clear: full aggressive measures desired.    Moderate protein calorie malnutrition/dysphagia Nutrition Status: Nutrition Problem: Moderate Malnutrition Etiology: chronic illness, cancer and cancer related treatments Signs/Symptoms: mild fat depletion, mild muscle depletion, percent weight loss Percent weight loss: 11 % Interventions: Prostat, Tube feeding, MVI --Continue tube feeds via cortrack-hopefully will begin to progress enough with SLP that we can begin trials of oral intake high in hopes of avoiding placement of PEG tube  --Continue MVM, prostat per RD --SLP following  Physical deconditioning/back pain --Secondary to prolonged critical illness and 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    Acute extensive right leg DVT:  Involving CFV, SF junction, proximal femoral and profunda veins and popliteal veins. Dx 8/5.  Did not have significant edema therefore did not require thrombolysis --Continue eliquis   New onset atrial fibrillation with RVR:  -Currently maintaining sinus rhythm w/o meds --Continue eliquis as above   Seizure disorder:  --Continue keppra   Hand/foot syndrome related to xeloda:  --Dose reduced in the outpatient setting and given acuity of current symptoms this medication has not been utilized during hospitalization --No infection noted at this time. Will continue moisturizing topical Tx   Acute metabolic encephalopathy, agitated delirium: Resolved --Continue clonazepam 0.32m per tube BID and prn-taper and DC as tolerated --Continue quetiapine  559mBID-taper and DC as tolerated   Uncontrolled T2DM on OHAs prior to admission with associated neuropathy:  HbA1c 8.7% at admission. --Levemir 15u BID and SSI q4h.  --Continue gabapentin 10049m8h.   HTN:  --Continue norvasc   HLD:  --Continue atorvastatin   Thrombocytopenia:  --Transient likely due to critical illness. HIT Ab negative on 8/5.   Sepsis: ruled out --initially thought to be present on admission- has been ruled out.    DVT prophylaxis:  apixaban (ELIQUIS) tablet 5 mg    Code Status: Full Code Family Communication: No family present at bedside this morning  Disposition Plan:  Level of care: Progressive Status is: Inpatient  Remains inpatient appropriate because:Unsafe d/c plan, IV treatments appropriate due to intensity of illness or inability to take PO, and Inpatient level of care appropriate due to severity of illness  Dispo: The patient is from: Home              Anticipated d/c is to: SNF              Patient currently is not medically stable to d/c.   Difficult to place patient No   Consultants:  Palliative care PCCM General surgery Nephrology Hematology/oncology  Procedures:  2u PRBC 7/31, 1u PRBC 8/17, 1u PRBC 8/27 Tracheostomy revision 01/05/2021 Dr. RosConstance Holsterntimicrobials:  Vancomycin 7/24 - 7/25, 8/2 - 8/2 Cefepime 7/24 - 7/27 Unasyn 7/30 >> cefepime/flagyl 7/31 - 8/6  Fortaz on TTS w/ HD 9/8>>    Subjective: Patient seen examined at bedside, resting comfortably.  No complaints this morning.  Denies headache, no chest pain, no abdominal pain, no shortness of breath more than his typical  baseline, no fever/chills/night sweats, no nausea/vomiting.  No acute events overnight per nurse staff.  Objective: Vitals:   01/21/21 1146 01/21/21 1228 01/21/21 1400 01/21/21 1515  BP:  122/71 (!) 107/58 (!) 107/58  Pulse: (!) 102  99 (!) 102  Resp: _0 Temp:  98.8 F (37.1 C)    TempSrc:  Oral    SpO2: 95%  94% 97%  Weight:       Height:        Intake/Output Summary (Last 24 hours) at 01/21/2021 1622 Last data filed at 01/21/2021 1500 Gross per 24 hour  Intake 2617.34 ml  Output 1900 ml  Net 717.34 ml   Filed Weights   01/20/21 0755 01/20/21 1150 01/21/21 0600  Weight: 104.6 kg 102.7 kg 103.4 kg    Examination:  General exam: Appears calm and comfortable, chronically ill, cachectic in appearance,cortrak noted in place Respiratory system: Decreased breath sounds bilateral bases, normal respiratory effort without accessory muscle use, trach noted, on trach collar Cardiovascular system: S1 & S2 heard, RRR. No JVD, murmurs, rubs, gallops or clicks. No pedal edema. Gastrointestinal system: Abdomen is nondistended, soft and nontender. No organomegaly or masses felt. Normal bowel sounds heard.  Ostomy noted. Central nervous system: Alert and oriented. No focal neurological deficits. Extremities: Symmetric 5 x 5 power. Skin: No rashes, lesions or ulcers Psychiatry: Judgement and insight appear normal. Mood & affect appropriate.     Data Reviewed: I have personally reviewed following labs and imaging studies  CBC: Recent Labs  Lab 01/15/21 0400 01/16/21 0041 01/17/21 0845 01/18/21 0352 01/19/21 0230 01/20/21 0516  WBC 15.1* 17.5* 14.5* 15.4* 17.8* 15.4*  NEUTROABS 10.3* 12.8* 10.1* 10.4* 12.9*  --   HGB 10.1* 10.1* 9.1* 9.8* 9.7* 9.4*  HCT 29.7* 31.4* 27.7* 29.3* 29.2* 28.1*  MCV 85.8 89.0 87.7 86.7 87.2 85.9  PLT 324 283 282 261 278 562   Basic Metabolic Panel: Recent Labs  Lab 01/17/21 0500 01/17/21 0845 01/18/21 0352 01/19/21 0230 01/20/21 0516 01/21/21 0239  NA 132*  --  134* 132*  134* 134* 134*  K 5.0  --  4.4 5.3*  5.3* 5.9* 4.5  CL 93*  --  90* 91*  93* 90* 89*  CO2 22  --  _1 21* 25  GLUCOSE 178*  --  169* 192*  189* 158* 187*  BUN 66*  --  38* 67*  65* 95* 58*  CREATININE 4.09*  --  2.89* 3.74*  3.71* 4.32* 3.20*  CALCIUM 9.6  --  9.2 9.6  9.7 9.8 9.3  MG  --  2.1  2.1 2.1  --   --   PHOS 8.0*  --  5.8* 8.2*  8.5* 10.4* 7.9*   GFR: Estimated Creatinine Clearance: 27.2 mL/min (A) (by C-G formula based on SCr of 3.2 mg/dL (H)). Liver Function Tests: Recent Labs  Lab 01/17/21 0845 01/18/21 0352 01/19/21 0230 01/20/21 0516 01/21/21 0239  AST 12* 16 14*  --   --   ALT _2 --   --   ALKPHOS 103 117 109  --   --   BILITOT 0.8 0.9 0.7  --   --   PROT 8.2* 8.3* 8.1  --   --   ALBUMIN 2.7* 2.8* 2.8*  2.8* 2.8* 2.9*   No results for input(s): LIPASE, AMYLASE in the last 168 hours. No results for input(s): AMMONIA in the last 168 hours. Coagulation Profile: No results for input(s): INR,  PROTIME in the last 168 hours. Cardiac Enzymes: No results for input(s): CKTOTAL, CKMB, CKMBINDEX, TROPONINI in the last 168 hours. BNP (last 3 results) No results for input(s): PROBNP in the last 8760 hours. HbA1C: No results for input(s): HGBA1C in the last 72 hours. CBG: Recent Labs  Lab 01/20/21 2013 01/20/21 2352 01/21/21 0408 01/21/21 0740 01/21/21 1226  GLUCAP 175* 221* 184* 194* 189*   Lipid Profile: No results for input(s): CHOL, HDL, LDLCALC, TRIG, CHOLHDL, LDLDIRECT in the last 72 hours. Thyroid Function Tests: No results for input(s): TSH, T4TOTAL, FREET4, T3FREE, THYROIDAB in the last 72 hours. Anemia Panel: No results for input(s): VITAMINB12, FOLATE, FERRITIN, TIBC, IRON, RETICCTPCT in the last 72 hours. Sepsis Labs: Recent Labs  Lab 01/15/21 0400 01/16/21 0039  PROCALCITON 0.98 1.64    Recent Results (from the past 240 hour(s))  Urine Culture     Status: Abnormal   Collection Time: 01/14/21  8:46 AM   Specimen: In/Out Cath Urine  Result Value Ref Range Status   Specimen Description IN/OUT CATH URINE  Final   Special Requests   Final    NONE Performed at [facility], [address], [place], [place] [address]    Culture MULTIPLE SPECIES PRESENT, SUGGEST RECOLLECTION (A)  Final   Report Status 01/15/2021 FINAL   Final  Culture, blood (Routine X 2) w Reflex to ID Panel     Status: None   Collection Time: 01/14/21 11:21 AM   Specimen: BLOOD  Result Value Ref Range Status   Specimen Description BLOOD RIGHT ANTECUBITAL  Final   Special Requests   Final    BOTTLES DRAWN AEROBIC ONLY Blood Culture adequate volume   Culture   Final    NO GROWTH 5 DAYS Performed at [facility], [place] [address] [place], [place] [address]    Report Status 01/19/2021 FINAL  Final  Culture, blood (Routine X 2) w Reflex to ID Panel     Status: None   Collection Time: 01/14/21 11:22 AM   Specimen: BLOOD  Result Value Ref Range Status   Specimen Description BLOOD LEFT ANTECUBITAL  Final   Special Requests   Final    BOTTLES DRAWN AEROBIC ONLY Blood Culture adequate volume   Culture   Final    NO GROWTH 5 DAYS Performed at [facility], [place] [address], [place], [place] [address]    Report Status 01/19/2021 FINAL  Final  Culture, Respiratory w Gram Stain     Status: None   Collection Time: 01/14/21 11:40 AM   Specimen: Tracheal Aspirate; Respiratory  Result Value Ref Range Status   Specimen Description TRACHEAL ASPIRATE  Final   Special Requests NONE  Final   Gram Stain   Final    ABUNDANT WBC PRESENT,BOTH PMN AND MONONUCLEAR FEW SQUAMOUS EPITHELIAL CELLS PRESENT MODERATE GRAM NEGATIVE RODS FEW GRAM VARIABLE ROD Performed at [facility], [place] [address], [place], [place] [address]    Culture MODERATE PSEUDOMONAS AERUGINOSA  Final   Report Status 01/18/2021 FINAL  Final   Organism ID, Bacteria PSEUDOMONAS AERUGINOSA  Final      Susceptibility   Pseudomonas aeruginosa - MIC*    CEFTAZIDIME <=1 SENSITIVE Sensitive     CIPROFLOXACIN <=0.25 SENSITIVE Sensitive     GENTAMICIN <=1 SENSITIVE Sensitive     PIP/TAZO <=4 SENSITIVE Sensitive     * MODERATE PSEUDOMONAS AERUGINOSA         Radiology Studies: No results found.  Scheduled Meds:  acetaminophen  1,000 mg Per Tube Q8H    amLODipine  5 mg Per Tube Daily   apixaban  5 mg Per Tube BID   vitamin C  500 mg Per Tube TID   atorvastatin  20 mg Per Tube Daily   chlorhexidine gluconate (MEDLINE KIT)  15 mL Mouth Rinse BID   Chlorhexidine Gluconate Cloth  6 each Topical Q0600   Chlorhexidine Gluconate Cloth  6 each Topical Q0600   clonazepam  0.5 mg Per Tube BID   feeding supplement (PROSource TF)  45 mL Per Tube BID   fiber  1 packet Per Tube BID   fluconazole  50 mg Per Tube Daily   gabapentin  100 mg Per Tube Q8H   hydrocerin   Topical BID   insulin aspart  0-15 Units Subcutaneous Q4H   insulin detemir  15 Units Subcutaneous BID   levETIRAcetam  500 mg Per Tube BID   loperamide HCl  1 mg Per Tube QID   mouth rinse  15 mL Mouth Rinse 10 times per day   multivitamin  1 tablet Per Tube QHS   nystatin  5 mL Per Tube QID   QUEtiapine  50 mg Per Tube BID   sevelamer carbonate  2.4 g Per Tube TID WC   thiamine  100 mg Per Tube Daily   Continuous Infusions:  sodium chloride Stopped (12/20/20 0955)   anticoagulant sodium citrate     cefTAZidime (FORTAZ)  IV Stopped (01/20/21 1057)   feeding supplement (JEVITY 1.5 CAL/FIBER) 1,000 mL (01/21/21 0952)     LOS: 51 days    Time spent: 36 minutes spent on chart review, discussion with nursing staff, consultants, updating family and interview/physical exam; more than 50% of that time was spent in counseling and/or coordination of care.    Rakesh Dutko J [place] DO [facility] Available via Epic secure chat 7am-7pm After these hours, please refer to coverage provider listed on [email or web address] 01/21/2021, 4:22 PM

## 2021-01-21 NOTE — Progress Notes (Signed)
Physical Therapy Treatment Patient Details Name: Thomas [name] MRN: [id number] DOB: 20-Jul-1955 Today's Date: 01/21/2021   History of Present Illness 65 y.o. male present to ED 7/24 with high ostomy output. Patient admitted with severe sepsis and multisystem organ failure. Hospital admission complicated by volume overload, aspiration pneumonia, acute respiratory failure, AKI and ischemic colitis. Intubated 7/30. S/p trach placement 8/11. CorTrak 8/12. HD initiated 8/12. To IR 8/15 for tunneled HD cath. Pulled out trach x2 8/22 replaced by PCCM. Pulled out trach again 8/25 but unable to be reinserted with pt intubated. 8/29 to OR for stoma clean up and trach replacement.Trach downsized 9/10.  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    PT Comments    Pt improving steadily, limited by his pain and anxiety, but able to hold it together well today.  Emphasis on transition to EOB with fewer compensations, scooting, sit to standing, pre-gait activity over 4 standing trials and pivotal steps to the chair.  On RA with PMV on and pt able to converse throughout.    Recommendations for follow up therapy are one component of a multi-disciplinary discharge planning process, led by the attending physician.  Recommendations may be updated based on patient status, additional functional criteria and insurance authorization.  Follow Up Recommendations  SNF     Equipment Recommendations  Other (comment)    Recommendations for Other Services       Precautions / Restrictions Precautions Precautions: Fall Precaution Comments: cortrak, R subclavian tunneled HD cath, ostomy, O2 via trach collar     Mobility  Bed Mobility Overal bed mobility: Needs Assistance Bed Mobility: Rolling;Sidelying to  Sit Rolling: Mod assist;+2 for safety/equipment Sidelying to sit: Mod assist;+2 for safety/equipment       General bed mobility comments: Again,bed flat, pt cued  in rolling sequence, allowed to use the rail and truncal assist given/stability while pt getting R UE into good assisting position.    Transfers Overall transfer level: Needs assistance Equipment used: Rolling walker (2 wheeled) (chair back for assist) Transfers: Sit to/from Stand Sit to Stand: Min assist;+2 physical assistance (x4) Stand pivot transfers: Mod assist;+2 physical assistance       General transfer comment: Pt consistently cued on hand placement and technique.  Assist to come both forward and to boost.  Pt improved in use of bil UE's to come forward and help him boost.  Ambulation/Gait             General Gait Details: not able yet   Stairs             Wheelchair Mobility    Modified Rankin (Stroke Patients Only)       Balance Overall balance assessment: Needs assistance Sitting-balance support: Feet supported;Bilateral upper extremity supported Sitting balance-Leahy Scale: Fair       Standing balance-Leahy Scale: Poor Standing balance comment: reliant on AD and or external support.  Stress on full extended upright posture, worked on w/shift and then stepping in place all 4 standing trials with continuation to the chair with pivotal steps over 3 feet.                            Cognition Arousal/Alertness: Awake/alert Behavior During Therapy: WFL for tasks assessed/performed;Anxious Overall Cognitive Status: Within Functional Limits for tasks assessed  Exercises Other Exercises Other Exercises: bil bicep/tricep presses with graded resistance x 10 reps Other Exercises: bil hip/knee flex/ext with graded assist/resistance x10 reps    General Comments        Pertinent Vitals/Pain Pain Assessment: Faces Faces  Pain Scale: Hurts even more Pain Location: back Pain Descriptors / Indicators: Aching;Moaning;Grimacing Pain Intervention(s): Monitored during session;Premedicated before session    Home Living                      Prior Function            PT Goals (current goals can now be found in the care plan section) Acute Rehab PT Goals Patient Stated Goal: to get out of here and get home. PT Goal Formulation: With patient Time For Goal Achievement: 02/04/21 Potential to Achieve Goals: Good Progress towards PT goals: Progressing toward goals (continue with same goals until consistent)    Frequency    Min 3X/week      PT Plan      Co-evaluation              AM-PAC PT "6 Clicks" Mobility   Outcome Measure  Help needed turning from your back to your side while in a flat bed without using bedrails?: A Lot Help needed moving from lying on your back to sitting on the side of a flat bed without using bedrails?: A Lot Help needed moving to and from a bed to a chair (including a wheelchair)?: Total Help needed standing up from a chair using your arms (e.g., wheelchair or bedside chair)?: A Lot Help needed to walk in hospital room?: Total Help needed climbing 3-5 steps with a railing? : Total 6 Click Score: 9    End of Session Equipment Utilized During Treatment: Oxygen Activity Tolerance: Patient tolerated treatment well;Patient limited by pain Patient left: in chair;with call bell/phone within reach;with chair alarm set Nurse Communication: Mobility status PT Visit Diagnosis: Muscle weakness (generalized) (M62.81);Other abnormalities of gait and mobility (R26.89)     Time: 1525-1550 PT Time Calculation (min) (ACUTE ONLY): 25 min  Charges:  $Therapeutic Activity: 8-22 mins $Neuromuscular Re-education: 8-22 mins                     01/21/2021  Ginger Carne., PT Acute Rehabilitation Services [phone]  (pager) [phone]  (office)   Tessie Fass  Dagmar Adcox 01/21/2021, 6:41 PM

## 2021-01-21 NOTE — Progress Notes (Signed)
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 CRRT initiated 7/31 and transitioned to IHD on 12/19/20.  S/p [facility] placement 12/22/20.  Continue with TTS schedule for now.   No sign of renal recovery, no uop recorded.   HD tomorrow -working with PT in regards to sitting in chair/recliner  # Volume overload/anasarca-improved with dialysis.  # Sepsis/aspiration pneumonia/VDRF:aspiration pneumonia.  Completed antibiotics course.  S/p tracheostomy, however pt removed it on 01/01/21 s/p intubation.  New tracheostomy created 01/05/21 by ENT.   # Stage IIIB colon cancer - sp partial colectomy, colostomy. On chemoRx sp 3 cycles.  # Right leg DVT: On Eliquis.  # Atrial fibrillation - on amiodarone and anticoagulation.    # Anemia of critical illness: DC'ed  IV iron as concern for infection and on abx, transfuse as needed, holding erythropoietin with active malignancy.  Monitor hemoglobin and transfuse as needed.  #CKD-MBD; started sevelamer for hyperphosphatemia on 9/8, monitor phosphorus level.  #Disposition - poor overall prognosis.  Seen by palliative care team.  Discussion ongoing for possible transfer to [facility].  The tracheostomy should be capped before he can be accepted to [facility]  Subjective: Seen and examined bedside this AM.  No acute events overnight, no complaints. Resting comfortably. Net uf 1825cc with hd yesterday. 390cc of uop charted Objective Vital signs in last 24 hours: Vitals:   01/21/21 0200 01/21/21 0400 01/21/21 0600 01/21/21 0813  BP: 110/68 120/68 127/76 128/69  Pulse: (!) 102 (!) 104 (!) 101   Resp: 16 15 15    Temp:      TempSrc:      SpO2: 99% 100% 98%   Weight:   103.4 kg   Height:       Weight change: -2.2 kg  Intake/Output Summary (Last 24 hours) at 01/21/2021 0839 Last data filed at 01/21/2021 0700 Gross per 24 hour   Intake 1887.34 ml  Output 3725 ml  Net -1837.66 ml       Labs: Basic Metabolic Panel: Recent Labs  Lab 01/19/21 0230 01/20/21 0516 01/21/21 0239  NA 132*  134* 134* 134*  K 5.3*  5.3* 5.9* 4.5  CL 91*  93* 90* 89*  CO2 22  24 21* 25  GLUCOSE 192*  189* 158* 187*  BUN 67*  65* 95* 58*  CREATININE 3.74*  3.71* 4.32* 3.20*  CALCIUM 9.6  9.7 9.8 9.3  PHOS 8.2*  8.5* 10.4* 7.9*   Liver Function Tests: Recent Labs  Lab 01/17/21 0845 01/18/21 0352 01/19/21 0230 01/20/21 0516 01/21/21 0239  AST 12* 16 14*  --   --   ALT 16 16 15   --   --   ALKPHOS 103 117 109  --   --   BILITOT 0.8 0.9 0.7  --   --   PROT 8.2* 8.3* 8.1  --   --   ALBUMIN 2.7* 2.8* 2.8*  2.8* 2.8* 2.9*   No results for input(s): LIPASE, AMYLASE in the last 168 hours. No results for input(s): AMMONIA in the last 168 hours. CBC: Recent Labs  Lab 01/16/21 0041 01/17/21 0845 01/18/21 0352 01/19/21 0230 01/20/21 0516  WBC 17.5* 14.5* 15.4* 17.8* 15.4*  NEUTROABS 12.8* 10.1* 10.4* 12.9*  --   HGB 10.1* 9.1* 9.8* 9.7* 9.4*  HCT 31.4* 27.7* 29.3* 29.2* 28.1*  MCV 89.0 87.7 86.7 87.2 85.9  PLT 283 282 261 278 339  Cardiac Enzymes: No results for input(s): CKTOTAL, CKMB, CKMBINDEX, TROPONINI in the last 168 hours. CBG: Recent Labs  Lab 01/20/21 1527 01/20/21 2013 01/20/21 2352 01/21/21 0408 01/21/21 0740  GLUCAP 153* 175* 221* 184* 194*    Iron Studies: No results for input(s): IRON, TIBC, TRANSFERRIN, FERRITIN in the last 72 hours. Studies/Results: DG Swallowing Func-Speech Pathology  Result Date: 01/19/2021 Table formatting from the original result was not included. 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  Dizziness   Dyspnea   pt states d/t weight  Epilepsy ([place])   takes Keppra daily  GERD (gastroesophageal reflux disease)   takes Omeprazole daily  Headache   Hyperlipidemia   takes Atorvastatin daily  Hypertension   takes Lotrel daily  Peripheral edema   takes Lasix daily  Peripheral neuropathy   takes Gabapentin daily  Pneumonia 3 yrs ago  hx of  Seizures ([place])   Sleep apnea   Urinary frequency  Past Surgical History: Past Surgical History: Procedure Laterality Date  BIOPSY  09/05/2020  Procedure: BIOPSY;  Surgeon: Irene Shipper, MD;  Location: [facility] ENDOSCOPY;  Service: Endoscopy;;  CARDIAC CATHETERIZATION  7 yrs ago  COLONOSCOPY WITH PROPOFOL N/A 09/05/2020  Procedure: COLONOSCOPY WITH PROPOFOL;  Surgeon: Irene Shipper, MD;  Location: [facility] ENDOSCOPY;  Service: Endoscopy;  Laterality: N/A;  CRANIOTOMY N/A 09/01/2016  Procedure: CRANIOTOMY TUMOR  LEFT PTERIONAL;  Surgeon: Ashok Pall, MD;  Location: [place];  Service: Neurosurgery;  Laterality: N/A;  cyst removed from chest     as a child  IR FLUORO GUIDE CV LINE LEFT  12/22/2020  IR US GUIDE VASC ACCESS LEFT  12/22/2020  NO PAST SURGERIES    PARTIAL COLECTOMY N/A 09/10/2020  Procedure: TRANSVERSE COLECTOMY;  Surgeon: [name], MD;  Location: [place];  Service: General;  Laterality: N/A;  POLYPECTOMY  09/05/2020  Procedure: POLYPECTOMY;  Surgeon: Irene Shipper, MD;  Location: [place];  Service: Endoscopy;;  PORTACATH PLACEMENT Right 10/01/2020  Procedure: INSERTION PORT-A-CATH;  Surgeon: [name], MD;  Location: [place];  Service: General;  Laterality: Right;  SUBMUCOSAL TATTOO INJECTION  09/05/2020  Procedure: SUBMUCOSAL TATTOO  INJECTION;  Surgeon: Irene Shipper, MD;  Location: [place];  Service: Endoscopy;;  TRACHEOSTOMY TUBE PLACEMENT N/A 01/05/2021  Procedure: TRACHEOSTOMY;  Surgeon: [name], MD;  Location: [place];  Service: ENT;  Laterality: N/A; HPI: Pt is a 65 y/o male admitted on 7/25 for worsening sepsis vs reaction to chemotherapy with high ostomy output and soft BP. Pt developed worsening hypoxia and was requiring BIPAP w/ FIO2 of 100% and increased work of breathing. SLP consulted due to concern for aspiration. BSE 7/28 recommended NPO status and MBS, but this could not completed subsequently due to lethargy. CXR in AM of 7/30 showed some worsening concern of aspiration. ETT 7/30-8/8; tolerated nasal cannula for about an hour; reintubated 8/8-trach 8/11. Pt pulled out  trach x2 8/22; replaced by PCCM. Bronch on 8/22 revealed endotracheal granulation tissue at trach site. Trach removed again on 8/25; unable to reinsert despite multiple attenpts; pt reintubated 8/25- trach by ENT on 8/29. BSE 8/22 limited to ice chips due to pt's refusal of other consistencies; rx NPO status with Cortrak. 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  Subjective: upright in bed, requires cueing Assessment / Plan / Recommendation CHL IP CLINICAL IMPRESSIONS 01/19/2021 Clinical Impression Pt presented with oral phase dysphagia characterized by oral holding, reduced bolus cohesion, impaired bolus propulsion, and impaired mastication. Pt reported attempts at posterior bolus propulsion, but limited lingual movement was noted. A posterior head tilt was ineffective in facilitating adequate A-P transport for swallowing. Limited hyolaryngeal elevation was noted with tactile stimulation to swallow,  but no boluses were propelled to the pharynx despite cues. No functional benefit was noted with cued effortful swallows. It is recommended that the pt's NPO status be maintained and SLP will follow for dysphagia treatment to improve oropharyngeal function. SLP Visit Diagnosis Dysphagia, oropharyngeal phase (R13.12) Attention and concentration deficit following -- Frontal lobe and executive function deficit following -- Impact on safety and function Severe aspiration risk;Risk for inadequate nutrition/hydration   CHL IP TREATMENT RECOMMENDATION 01/19/2021 Treatment Recommendations Therapy as outlined in treatment plan below   Prognosis 01/19/2021 Prognosis for Safe Diet Advancement Fair Barriers to Reach Goals Time post onset;Severity of deficits Barriers/Prognosis Comment -- [facility] IP DIET RECOMMENDATION 01/19/2021 SLP Diet Recommendations Alternative means - temporary;NPO Liquid Administration via -- Medication Administration Via alternative means Compensations -- Postural Changes --   [facility] IP OTHER RECOMMENDATIONS 01/19/2021 Recommended Consults -- Oral Care Recommendations Oral care QID Other Recommendations --   [facility] IP FOLLOW UP RECOMMENDATIONS 01/19/2021 Follow up Recommendations Skilled Nursing facility   [facility] IP FREQUENCY AND DURATION 01/19/2021 Speech Therapy Frequency (ACUTE ONLY) min 2x/week Treatment Duration 2 weeks      CHL IP ORAL PHASE 01/19/2021 Oral Phase Impaired Oral - Pudding Teaspoon -- Oral - Pudding Cup -- Oral - Honey Teaspoon -- Oral - Honey Cup -- Oral - Nectar Teaspoon -- Oral - Nectar Cup -- Oral - Nectar Straw -- Oral - Thin Teaspoon Holding of bolus;Reduced posterior propulsion Oral - Thin Cup -- Oral - Thin Straw -- Oral - Puree Reduced posterior propulsion;Holding of bolus Oral - Mech Soft -- Oral - Regular Impaired mastication Oral - Multi-Consistency -- Oral - Pill -- Oral Phase - Comment --  CHL IP PHARYNGEAL PHASE 01/19/2021 Pharyngeal Phase Impaired Pharyngeal- Pudding Teaspoon -- Pharyngeal  -- Pharyngeal- Pudding Cup -- Pharyngeal -- Pharyngeal- Honey Teaspoon -- Pharyngeal -- Pharyngeal- Honey Cup -- Pharyngeal -- Pharyngeal- Nectar Teaspoon -- Pharyngeal -- Pharyngeal- Nectar Cup -- Pharyngeal -- Pharyngeal- Nectar Straw -- Pharyngeal -- Pharyngeal- Thin Teaspoon Reduced laryngeal elevation Pharyngeal -- Pharyngeal- Thin Cup Reduced laryngeal elevation Pharyngeal -- Pharyngeal- Thin Straw -- Pharyngeal -- Pharyngeal- Puree Reduced laryngeal elevation Pharyngeal -- Pharyngeal- Mechanical Soft -- Pharyngeal -- Pharyngeal- Regular Reduced laryngeal elevation Pharyngeal -- Pharyngeal- Multi-consistency -- Pharyngeal -- Pharyngeal- Pill -- Pharyngeal -- Pharyngeal Comment --  CHL IP CERVICAL ESOPHAGEAL PHASE 01/19/2021 Cervical Esophageal Phase (No Data) Pudding Teaspoon -- Pudding Cup -- Honey Teaspoon -- Honey Cup -- Nectar Teaspoon -- Nectar Cup -- Nectar Straw -- Thin Teaspoon --  Thin Cup -- Thin Straw -- Puree -- Mechanical Soft -- Regular -- Multi-consistency -- Pill -- Cervical Esophageal Comment -- Shanika I. Hardin Negus, [place], [place] Office number [phone] Pager [phone] Horton Marshall 01/19/2021, 3:22 PM               Medications: Infusions:  sodium chloride Stopped (12/20/20 0955)   anticoagulant sodium citrate     cefTAZidime (FORTAZ)  IV Stopped (01/20/21 1057)   feeding supplement (JEVITY 1.5 CAL/FIBER) 1,000 mL (01/18/21 0524)    Scheduled Medications:  acetaminophen  1,000 mg Per Tube Q8H   amLODipine  5 mg Per Tube Daily   apixaban  5 mg Per Tube BID   vitamin C  500 mg Per Tube TID   atorvastatin  20 mg Per Tube Daily   chlorhexidine gluconate (MEDLINE KIT)  15 mL Mouth Rinse BID   Chlorhexidine Gluconate Cloth  6 each Topical Q0600   Chlorhexidine Gluconate Cloth  6 each Topical Q0600   clonazepam  0.5 mg Per Tube BID   feeding supplement (PROSource TF)  45 mL Per Tube BID   fiber  1 packet Per Tube BID   fluconazole  50 mg Per  Tube Daily   gabapentin  100 mg Per Tube Q8H   hydrocerin   Topical BID   insulin aspart  0-15 Units Subcutaneous Q4H   insulin detemir  15 Units Subcutaneous BID   levETIRAcetam  500 mg Per Tube BID   loperamide HCl  1 mg Per Tube QID   mouth rinse  15 mL Mouth Rinse 10 times per day   multivitamin  1 tablet Per Tube QHS   nystatin  5 mL Per Tube QID   QUEtiapine  50 mg Per Tube BID   sevelamer carbonate  2.4 g Per Tube TID WC   thiamine  100 mg Per Tube Daily    have reviewed scheduled and prn medications.  Physical Exam: General: Ill-looking male, with tracheostomy on trach collar, not in distress Heart:RRR, s1s2 nl Lungs: normal wob Abdomen:soft, Non-tender, non-distended Extremities: No edema. Neurology: Alert awake and following commands. Dialysis Access: [facility] in place.   Audi Wettstein 01/21/2021,8:39 AM  LOS: 51 days

## 2021-01-21 NOTE — Progress Notes (Signed)
CSW spoke with Raquel Sarna at [facility] who states the facility does not have any HD beds at this time.  Madilyn Fireman, MSW, LCSW Transitions of Care  Clinical Social Worker II [phone]

## 2021-01-21 NOTE — Progress Notes (Signed)
  Speech Language Pathology Treatment: Dysphagia;Passy Muir Speaking valve  Patient Details Name: Thomas [name] MRN: [id number] DOB: April 25, 1956 Today's Date: 01/21/2021 Time: 1740-8144 SLP Time Calculation (min) (ACUTE ONLY): 24 min  Assessment / Plan / Recommendation Clinical Impression  Pt was seen for treatment. He was alert and cooperative during the session. Pt tolerated PMSV for over 30 minutes with stable vitals and no evidence of air trapping. Vocal intensity was reduced and quality was hoarse. Pt was able to communicate in sentences with reduced speech intelligibility which improved with request for clarification and following oral expectoration of secretions. Oral inspection revealed significant dried mucous on the superior lingual surface with multiple cracks in the casing of dried mucous/secretions. A significant length of time was spent on oral care using toothpaste, oral rinse, and toothbrush connected to suction. Some pieces of dried secretions were removed from the tongue and hard palate with repetitive brushing. Pt completed lingual resistant exercises with cues. Pt's lingual movement was mildly improved following oral care. Pt was able to orally expectorate thick secretions following prolonged oral care and vocal quality improved thereafter. Pt demonstrated some lingual manipulation and sucking of ice chips and he was able to swallow small boluses without s/sx of aspiration. Pt's NPO status will be maintained, but pt may have ice chips following oral care. PMSV may still be used during all waking hours. SLP will continue to follow pt.    HPI HPI: Pt is a 65 y/o male admitted on 7/25 for worsening sepsis vs reaction to chemotherapy with high ostomy output and soft BP. Pt developed worsening hypoxia and was requiring BIPAP w/ FIO2 of 100% and increased work of breathing. SLP consulted due to concern for aspiration. BSE 7/28 recommended NPO status and MBS, but this could not completed  subsequently due to lethargy. CXR in AM of 7/30 showed some worsening concern of aspiration. ETT 7/30-8/8; tolerated nasal cannula for about an hour; reintubated 8/8-trach 8/11. Pt pulled out trach x2 8/22; replaced by PCCM. Bronch on 8/22 revealed endotracheal granulation tissue at trach site. Trach removed again on 8/25; unable to reinsert despite multiple attenpts; pt reintubated 8/25- trach by ENT on 8/29. BSE 8/22 limited to ice chips due to pt's refusal of other consistencies; rx NPO status with Cortrak. 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      SLP Plan  Continue with current plan of care       Recommendations  Diet recommendations: NPO (ice chips allowed following oral care) Medication Administration: Via alternative means      Patient may use Passy-Muir Speech Valve: During all waking hours (remove during sleep) PMSV Supervision: Intermittent MD: Please consider changing trach tube to : Smaller size         Oral Care Recommendations: Oral care QID;Staff/trained caregiver to provide oral care Follow up Recommendations: Other (comment) (TBD) SLP Visit Diagnosis: Dysphagia, unspecified (R13.10);Aphonia (R49.1) Plan: Continue with current plan of care       Thomas [name], [place], [place] Office number [phone] Pager [place] 01/21/2021, 4:09 PM

## 2021-01-22 ENCOUNTER — Inpatient Hospital Stay (HOSPITAL_COMMUNITY): Payer: Medicare HMO

## 2021-01-22 DIAGNOSIS — N171 Acute kidney failure with acute cortical necrosis: Secondary | ICD-10-CM | POA: Diagnosis not present

## 2021-01-22 DIAGNOSIS — E1100 Type 2 diabetes mellitus with hyperosmolarity without nonketotic hyperglycemic-hyperosmolar coma (NKHHC): Secondary | ICD-10-CM | POA: Diagnosis not present

## 2021-01-22 DIAGNOSIS — G9341 Metabolic encephalopathy: Secondary | ICD-10-CM | POA: Diagnosis not present

## 2021-01-22 DIAGNOSIS — N170 Acute kidney failure with tubular necrosis: Secondary | ICD-10-CM | POA: Diagnosis not present

## 2021-01-22 LAB — CBC
HCT: 27.5 % — ABNORMAL LOW (ref 39.0–52.0)
Hemoglobin: 9.3 g/dL — ABNORMAL LOW (ref 13.0–17.0)
MCH: 29.2 pg (ref 26.0–34.0)
MCHC: 33.8 g/dL (ref 30.0–36.0)
MCV: 86.5 fL (ref 80.0–100.0)
Platelets: 260 10*3/uL (ref 150–400)
RBC: 3.18 MIL/uL — ABNORMAL LOW (ref 4.22–5.81)
RDW: 15.5 % (ref 11.5–15.5)
WBC: 15.6 10*3/uL — ABNORMAL HIGH (ref 4.0–10.5)
nRBC: 0 % (ref 0.0–0.2)

## 2021-01-22 LAB — RENAL FUNCTION PANEL
Albumin: 2.9 g/dL — ABNORMAL LOW (ref 3.5–5.0)
Anion gap: 23 — ABNORMAL HIGH (ref 5–15)
BUN: 80 mg/dL — ABNORMAL HIGH (ref 8–23)
CO2: 23 mmol/L (ref 22–32)
Calcium: 9.8 mg/dL (ref 8.9–10.3)
Chloride: 87 mmol/L — ABNORMAL LOW (ref 98–111)
Creatinine, Ser: 3.96 mg/dL — ABNORMAL HIGH (ref 0.61–1.24)
GFR, Estimated: 16 mL/min — ABNORMAL LOW (ref >60)
Glucose, Bld: 190 mg/dL — ABNORMAL HIGH (ref 70–99)
Phosphorus: 10.9 mg/dL — ABNORMAL HIGH (ref 2.5–4.6)
Potassium: 4.8 mmol/L (ref 3.5–5.1)
Sodium: 133 mmol/L — ABNORMAL LOW (ref 135–145)

## 2021-01-22 LAB — GLUCOSE, CAPILLARY
Glucose-Capillary: 173 mg/dL — ABNORMAL HIGH (ref 70–99)
Glucose-Capillary: 169 mg/dL — ABNORMAL HIGH (ref 70–99)
Glucose-Capillary: 240 mg/dL — ABNORMAL HIGH (ref 70–99)
Glucose-Capillary: 179 mg/dL — ABNORMAL HIGH (ref 70–99)
Glucose-Capillary: 154 mg/dL — ABNORMAL HIGH (ref 70–99)

## 2021-01-22 MED ORDER — NEPRO/CARBSTEADY PO LIQD
1000.0000 mL | ORAL | Status: DC
  Administered 2021-01-22 – 2021-01-28 (×7): 1000 mL
  Filled 2021-01-22 (×11): qty 1000

## 2021-01-22 NOTE — Progress Notes (Signed)
PROGRESS NOTE    Thomas [name]  FHL:[id number] DOB: [birth year] DOA: 11/30/2020 PCP: [name], NP    Brief Narrative:   Thomas [name] is a 65 y.o. male with a history of colon cancer s/p resection and colostomy May 2022 s/p 3 cycles chemotherapy (last was July 2022), type 2 diabetes mellitus with neuropathy, hypertension, hyperlipidemia presented to the ED on 11/30/2020 with complaints of abdominal pain.  Patient was found to be febrile, tachycardic, tachypneic with elevated lactate and was noted to have sepsis.  Patient was then admitted hospital for acute kidney injury, demand ischemia, hyperglycemia with lactic acidosis.  Patient developed atrial fibrillation with RVR and was started on Cardizem drip.  Was initially admitted to the ICU for septic shock.  His presentation was also thought to be likely secondary to cepacitabine toxicity with oral mucositis, hand/foot syndrome, and diarrhea/high output colostomy.  Patient was transferred to hospitalist service on 7/27 but developed metabolic encephalopathy with hyoxia due to aspiration pneumonia on 12/06/20 requiring intubation and pressor support.  Patient failed extubation with inability to clear secretions, requiring reintubation 12/15/20, and tracheostomy subsequently placed 12/18/20. This was pulled out 8/22, replaced under bronch guidance, again removed 8/25 requiring reintubation after failed reinsertion. ENT revised tracheostomy in the OR on 8/29.  Also required intermittent hemodialysis for acute kidney injury.  Patient also had RLE DVT now on eliquis, colitis thought to be ischemic 12/09/20 not requiring specific intervention.  She also had Pseudomonas pneumonia having completed treatment, metabolic encephalopathy which has improved, anemia requiring 4u PRBCs total, and thrombocytopenia which has resolved. On 9/4, he was transferred to hospitalist service having tolerated trach collar.   Assessment & Plan:   Principal Problem:   Diabetes mellitus  with hyperosmolarity without hyperglycemic hyperosmolar nonketotic coma (HCC) Active Problems:   Lactic acidosis   Acute renal failure superimposed on stage 2 chronic kidney disease (HCC)   Metabolic acidosis   Tachycardia   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 (HCC)   Adverse effect of chemotherapy   Pressure injury of skin   Atrial fibrillation with RVR (HCC)   Sepsis (HCC)   Malnutrition of moderate degree   Acute metabolic encephalopathy   Acute respiratory failure with hypoxemia (HCC)   Acute renal failure with acute cortical necrosis (HCC)   Shock ([place])   Status post tracheostomy ([place])   Endotracheal tube present   Hemodialysis status ([place])   Palliative care by specialist  Acute hypoxic respiratory failure s/p tracheostomy due to aspiration pneumonia with Recurrent pansensitive Pseudomonas tracheobronchitis Continue speech therapy, tolerating PMV.  Continue [name] on Tuesday Thursday schedule.  ENT on board and changed to cuffless trach on 01/16/2021.     Oral Candida Received Diflucan x 6 doses   Acute renal failure on stage IIIb CKD:  Presumed ischemic ATN. Patient was seen by nephrology.  Received CRRT 7/31 > iHD 8/12 s/p left IJ [facility] 12/22/20.  Continue hemodialysis as per nephrology.  Patient is to tolerate sitting up in chair for dialysis treatment.  Not restricted to have renal recovery so we will remain likely to be permanent.   High output ostomy, diarrhea:  Negative C. difficile PCR and GI pathogen panel.     Anemia of critical illness, acute blood loss as well as anemia of chronic disease and iron deficiency:  Patient received 4 units of packed RBC during hospitalization.  Has received IV iron x8 doses.  Latest hemoglobin of 9.3.   Stage IIIb colon CA s/p partial  transverse colectomy with end colostomy May 2022:  Patient was followed by Dr. Rondel Oh oncology as outpatient.Buddy Duty CAPOX June 2022, reduced cepecitabine dose starting  11/19/2020 due to hand/foot syndrome, reduced oxaliplatin due to renal impairment and poor performance status.  Palliative care followed the patient during hospitalization.  Code and full scope of treatment with   Moderate protein calorie malnutrition/dysphagia Continue tube feeds via cortrak tube.  Continue speech therapy evaluation if able to advance diet.  Nutrition Status: Nutrition Problem: Moderate Malnutrition Etiology: chronic illness, cancer and cancer related treatments Signs/Symptoms: mild fat depletion, mild muscle depletion, percent weight loss Percent weight loss: 11 % Interventions: Prostat, Tube feeding, MVI  Physical deconditioning/back pain --Secondary to prolonged critical illness and myopathy.  Continue physical therapy.  on Oxy IR to 7.5 mg.    Acute extensive right leg DVT:  Diagnosed on 12/12/2020.  Involving CFV, SF junction, proximal femoral and profunda veins and popliteal veins. Continue eliquis   New onset atrial fibrillation with RVR:  Continue Eliquis.  Currently on sinus rhythm.   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  Not been used during hospitalization.   Acute metabolic encephalopathy, agitated delirium: Resolved On Klonopin and Seroquel.  Taper as over time   Uncontrolled T2DM on OHAs prior to admission with associated neuropathy:  HbA1c 8.7% at admission.  Continue Levemir sliding scale insulin and gabapentin.  Essential HTN:  Continue Norvasc   Hyperlipidemia Continue atorvastatin   Thrombocytopenia:  --Transient likely due to critical illness.  HIT antibody negative on 12/12/2020.  Sepsis: ruled out  DVT prophylaxis:  apixaban (ELIQUIS) tablet 5 mg     Code Status: Full Code  Family Communication:  None  Disposition Plan:   Status is: Inpatient  Remains inpatient appropriate because:Unsafe d/c plan, IV treatments appropriate due to intensity  of illness or inability to take PO, and Inpatient level of care appropriate due to severity of illness  Dispo: The patient is from: Home              Anticipated d/c is to: SNF              Patient currently is not medically stable to d/c.   Difficult to place patient No  Consultants:  Palliative care PCCM General surgery Nephrology Hematology/oncology  Procedures:  2u PRBC 12/07/20, 1u PRBC 8/17, 1u PRBC 8/27 Tracheostomy revision 01/05/2021 Dr. Constance Holster  Antimicrobials:  Vancomycin 7/24 - 7/25, 8/2 - 8/2 Cefepime 7/24 - 7/27 Unasyn 7/30 >> cefepime/flagyl 7/31 - 8/6  Fortaz on TTS w/ HD 9/8>>   Subjective: Today, patient was seen and examined at bedside. Patient has indistinct speech.  Denies any headache chest pain shortness of breath.  Complains of back pain.  Objective: Vitals:   01/22/21 1100 01/22/21 1120 01/22/21 1200 01/22/21 1300  BP: 113/84 115/86 137/79 119/67  Pulse:  (!) 127 (!) 115 100  Resp:  (!) 37 (!) 26   Temp:  (!) 97.5 F (36.4 C) 97.6 F (36.4 C)   TempSrc:  Axillary    SpO2:      Weight:  101 kg    Height:        Intake/Output Summary (Last 24 hours) at 01/22/2021 1432 Last data filed at 01/22/2021 1120 Gross per 24 hour  Intake 260 ml  Output 1413 ml  Net -1153 ml    Filed Weights   01/22/21 0500 01/22/21 0927 01/22/21 1120  Weight: 103.4 kg 101.7 kg 101 kg   Physical examination: General:  Average built, not in obvious distress HENT:   Tracheostomy tube in place, cortrak tube tube in place Chest:  Clear breath sounds.  Diminished breath sounds bilaterally. No crackles or wheezes.  CVS: S1 &S2 heard. No murmur.  Regular rate and rhythm. Abdomen: Soft, nontender, nondistended.  Bowel sounds are heard.  Left-sided pain. Extremities: No cyanosis, clubbing or edema.  Peripheral pulses are palpable. Psych: Alert, awake and oriented, normal mood CNS:  No cranial nerve deficits.  Power equal in all extremities.   Skin: Warm and dry.  No rashes  noted.   Data Reviewed: I personally reviewed the following labs and imaging studies.   CBC: Recent Labs  Lab 02/10/21 0041 01/17/21 0845 01/18/21 0352 01/19/21 0230 01/20/21 0516 01/22/21 1003  WBC 17.5* 14.5* 15.4* 17.8* 15.4* 15.6*  NEUTROABS 12.8* 10.1* 10.4* 12.9*  --   --   HGB 10.1* 9.1* 9.8* 9.7* 9.4* 9.3*  HCT 31.4* 27.7* 29.3* 29.2* 28.1* 27.5*  MCV 89.0 87.7 86.7 87.2 85.9 86.5  PLT 283 282 261 278 339 287    Basic Metabolic Panel: Recent Labs  Lab 01/17/21 0845 01/18/21 0352 01/19/21 0230 01/20/21 0516 01/21/21 0239 01/22/21 0203  NA  --  134* 132*  134* 134* 134* 133*  K  --  4.4 5.3*  5.3* 5.9* 4.5 4.8  CL  --  90* 91*  93* 90* 89* 87*  CO2  --  _0 21* 25 23  GLUCOSE  --  169* 192*  189* 158* 187* 190*  BUN  --  38* 67*  65* 95* 58* 80*  CREATININE  --  2.89* 3.74*  3.71* 4.32* 3.20* 3.96*  CALCIUM  --  9.2 9.6  9.7 9.8 9.3 9.8  MG 2.1 2.1 2.1  --   --   --   PHOS  --  5.8* 8.2*  8.5* 10.4* 7.9* 10.9*    GFR: Estimated Creatinine Clearance: 21.7 mL/min (A) (by C-G formula based on SCr of 3.96 mg/dL (H)). Liver Function Tests: Recent Labs  Lab 01/17/21 0845 01/18/21 0352 01/19/21 0230 01/20/21 0516 01/21/21 0239 01/22/21 0203  AST 12* 16 14*  --   --   --   ALT _1 --   --   --   ALKPHOS 103 117 109  --   --   --   BILITOT 0.8 0.9 0.7  --   --   --   PROT 8.2* 8.3* 8.1  --   --   --   ALBUMIN 2.7* 2.8* 2.8*  2.8* 2.8* 2.9* 2.9*    No results for input(s): LIPASE, AMYLASE in the last 168 hours. No results for input(s): AMMONIA in the last 168 hours. Coagulation Profile: No results for input(s): INR, PROTIME in the last 168 hours. Cardiac Enzymes: No results for input(s): CKTOTAL, CKMB, CKMBINDEX, TROPONINI in the last 168 hours. BNP (last 3 results) No results for input(s): PROBNP in the last 8760 hours. HbA1C: No results for input(s): HGBA1C in the last 72 hours. CBG: Recent Labs  Lab 01/21/21 1952  01/21/21 2346 01/22/21 0441 01/22/21 0805 01/22/21 1205  GLUCAP 185* 206* 173* 169* 240*    Lipid Profile: No results for input(s): CHOL, HDL, LDLCALC, TRIG, CHOLHDL, LDLDIRECT in the last 72 hours. Thyroid Function Tests: No results for input(s): TSH, T4TOTAL, FREET4, T3FREE, THYROIDAB in the last 72 hours. Anemia Panel: No results for input(s):  VITAMINB12, FOLATE, FERRITIN, TIBC, IRON, RETICCTPCT in the last 72 hours. Sepsis Labs: Recent Labs  Lab 01/16/21 0039  PROCALCITON 1.64     Recent Results (from the past 240 hour(s))  Urine Culture     Status: Abnormal   Collection Time: 01/14/21  8:46 AM   Specimen: In/Out Cath Urine  Result Value Ref Range Status   Specimen Description IN/OUT CATH URINE  Final   Special Requests   Final    NONE Performed at [facility], [address], [place], [place] [address]    Culture MULTIPLE SPECIES PRESENT, SUGGEST RECOLLECTION (A)  Final   Report Status 01/15/2021 FINAL  Final  Culture, blood (Routine X 2) w Reflex to ID Panel     Status: None   Collection Time: 01/14/21 11:21 AM   Specimen: BLOOD  Result Value Ref Range Status   Specimen Description BLOOD RIGHT ANTECUBITAL  Final   Special Requests   Final    BOTTLES DRAWN AEROBIC ONLY Blood Culture adequate volume   Culture   Final    NO GROWTH 5 DAYS Performed at [facility], [place] [address], [place], [place] [address]    Report Status 01/19/2021 FINAL  Final  Culture, blood (Routine X 2) w Reflex to ID Panel     Status: None   Collection Time: 01/14/21 11:22 AM   Specimen: BLOOD  Result Value Ref Range Status   Specimen Description BLOOD LEFT ANTECUBITAL  Final   Special Requests   Final    BOTTLES DRAWN AEROBIC ONLY Blood Culture adequate volume   Culture   Final    NO GROWTH 5 DAYS Performed at [facility], [place] [address], [place], [place] [address]    Report Status 01/19/2021 FINAL  Final  Culture, Respiratory w Gram Stain     Status: None    Collection Time: 01/14/21 11:40 AM   Specimen: Tracheal Aspirate; Respiratory  Result Value Ref Range Status   Specimen Description TRACHEAL ASPIRATE  Final   Special Requests NONE  Final   Gram Stain   Final    ABUNDANT WBC PRESENT,BOTH PMN AND MONONUCLEAR FEW SQUAMOUS EPITHELIAL CELLS PRESENT MODERATE GRAM NEGATIVE RODS FEW GRAM VARIABLE ROD Performed at [facility], [place] [address] [place],  [address]    Culture MODERATE PSEUDOMONAS AERUGINOSA  Final   Report Status 01/18/2021 FINAL  Final   Organism ID, Bacteria PSEUDOMONAS AERUGINOSA  Final      Susceptibility   Pseudomonas aeruginosa - MIC*    CEFTAZIDIME <=1 SENSITIVE Sensitive     CIPROFLOXACIN <=0.25 SENSITIVE Sensitive     GENTAMICIN <=1 SENSITIVE Sensitive     PIP/TAZO <=4 SENSITIVE Sensitive     * MODERATE PSEUDOMONAS AERUGINOSA      Radiology Studies: No results found.   Scheduled Meds:  acetaminophen  1,000 mg Per Tube Q8H   amLODipine  5 mg Per Tube Daily   apixaban  5 mg Per Tube BID   vitamin C  500 mg Per Tube TID   atorvastatin  20 mg Per Tube Daily   chlorhexidine gluconate (MEDLINE KIT)  15 mL Mouth Rinse BID   Chlorhexidine Gluconate Cloth  6 each Topical Q0600   Chlorhexidine Gluconate Cloth  6 each Topical Q0600   clonazepam  0.5 mg Per Tube BID   feeding supplement (PROSource TF)  45 mL Per Tube BID   fiber  1 packet Per Tube BID   gabapentin  100 mg  Per Tube Q8H   hydrocerin   Topical BID   insulin aspart  0-15 Units Subcutaneous Q4H   insulin detemir  15 Units Subcutaneous BID   levETIRAcetam  500 mg Per Tube BID   loperamide HCl  1 mg Per Tube QID   mouth rinse  15 mL Mouth Rinse 10 times per day   multivitamin  1 tablet Per Tube QHS   nystatin  5 mL Per Tube QID   QUEtiapine  50 mg Per Tube BID   sevelamer carbonate  2.4 g Per Tube TID WC   thiamine  100 mg Per Tube Daily   Continuous Infusions:  sodium chloride Stopped (12/20/20 0955)   anticoagulant sodium citrate      cefTAZidime (FORTAZ)  IV 2 g (01/22/21 1222)   feeding supplement (JEVITY 1.5 CAL/FIBER) 1,000 mL (01/21/21 0952)     LOS: 89 days   Flora Lipps, MD [facility] Available via Epic secure chat 7am-7pm After these hours, please refer to coverage provider listed on [email or web address] 01/22/2021, 2:32 PM

## 2021-01-22 NOTE — Progress Notes (Signed)
   01/22/21 1200  Assess: MEWS Score  Temp 97.6 F (36.4 C)  BP 137/79  Pulse Rate (!) 115  Resp (!) 26  Assess: MEWS Score  MEWS Temp 0  MEWS Systolic 0  MEWS Pulse 2  MEWS RR 2  MEWS LOC 0  MEWS Score 4  MEWS Score Color Red  Assess: if the MEWS score is Yellow or Red  Were vital signs taken at a resting state? Yes  Focused Assessment Change from prior assessment (see assessment flowsheet)  Early Detection of Sepsis Score *See Row Information* Medium  MEWS guidelines implemented *See Row Information* Yes  Treat  MEWS Interventions Administered scheduled meds/treatments;Administered prn meds/treatments  Pain Scale Faces  Take Vital Signs  Increase Vital Sign Frequency  Red: Q 1hr X 4 then Q 4hr X 4, if remains red, continue Q 4hrs  Escalate  MEWS: Escalate Red: discuss with charge nurse/RN and provider, consider discussing with RRT  Notify: Charge Nurse/RN  Name of Charge Nurse/RN Notified Josh  Date Charge Nurse/RN Notified 01/22/21  Time Charge Nurse/RN Notified 1200  Notify: Provider  Provider Name/Title Cecil  Date Provider Notified 01/22/21  Time Provider Notified 1200  Notification Type Face-to-face  Notification Reason  (at bedside)  Provider response See new orders  Document  Patient Outcome Stabilized after interventions  Progress note created (see row info) Yes  Assess: SIRS CRITERIA  SIRS Temperature  0  SIRS Pulse 1  SIRS Respirations  1  SIRS WBC 1  SIRS Score Sum  3

## 2021-01-22 NOTE — Progress Notes (Signed)
Pt requested to be suctioned and pt started coughing during suctioning by RN. Pt had a lot of brown secretion from mouth and trach site. RT notified and at bedside. Pt feeding on hold and MD notified concerning aspiration risk. New orders received from MD, Pt in bed with RT and spouse at bedside. Will continue to closely monitor. Francis Gaines Jemal Miskell RN.   01/22/21 1941  Vitals  Temp 97.8 F (36.6 C)  Temp Source Oral  BP 123/81  MAP (mmHg) 95  BP Location Left Arm  BP Method Automatic  Patient Position (if appropriate) Lying  Pulse Rate (!) 108  Pulse Rate Source Monitor  Resp (!) 24  Level of Consciousness  Level of Consciousness Alert  MEWS COLOR  MEWS Score Color Yellow  Oxygen Therapy  SpO2 94 %  O2 Device Tracheostomy Collar  O2 Flow Rate (L/min) 5 L/min  FiO2 (%) 28 %  MEWS Score  MEWS Temp 0  MEWS Systolic 0  MEWS Pulse 1  MEWS RR 1  MEWS LOC 0  MEWS Score 2  Provider Notification  Provider Name/Title Dr. Hal Hope  Date Provider Notified 01/22/21  Time Provider Notified 1945  Notification Type Page  Notification Reason Other (Comment) (possible feeding from trach/mouth)  Provider response See new orders  Date of Provider Response 01/22/21  Time of Provider Response 1950

## 2021-01-22 NOTE — Progress Notes (Addendum)
Pharmacy Antibiotic Note  Thomas [name] is a 65 y.o. male admitted on 11/30/2020 with pneumonia.  Pharmacy has been consulted for Ceftazidime dosing for recurrent Pseudomonas tracheobronchitis.  Today is abx day #8.  Ceftazidime 2gm IV scheduled TTS after HD.  Given on 9/8 but dose missed on 9/10.  HD on schedule 9/15 and dose given post HD.  Plan: Continue with 2gm after HD-TTS.  Follow up to be sure doses are given. Follow up clinical progress, duration of therapy.  Height: 5\' 8"  (172.7 cm) Weight: 101 kg (222 lb 10.6 oz) IBW/kg (Calculated) : 68.4  Temp (24hrs), Avg:97.8 F (36.6 C), Min:97.5 F (36.4 C), Max:98.6 F (37 C)  Recent Labs  Lab 01/17/21 0845 01/18/21 0352 01/19/21 0230 01/20/21 0516 01/21/21 0239 01/22/21 0203 01/22/21 1003  WBC 14.5* 15.4* 17.8* 15.4*  --   --  15.6*  CREATININE  --  2.89* 3.74*  3.71* 4.32* 3.20* 3.96*  --      No Known Allergies  Antimicrobials this admission: Fluconazole 9/9 >> 9/15 Ceftazidime 9/8>> Nystatin oral 9/9>> Cefepime 7/24 >> 7/28, 7/31 >> 8/7   Vanc x1 7/25, 8/1 >> 8/2 Unasyn 7/30 >> 7/31 Flagyl 7/31 >> 8/7 Eraxis 8/1 >> 8/2 Cefazolin 8/15 >8/16 Ceftriaxone 8/16> 8/17; x 1 on 9/7 Cefepime 8/17>8/22  Microbiology results: 7/24 Blood: negative 7/25 Urine:  negative 7/25 MRSA PCR: negative 7/26 C diff: negative 7/31 Trach: candida tropicalis (likely not pathogenic) 8/1 GI panel: negative 8/15 tracheal aspirate: rare Pseudomonas, sens ceftaz, cipro, zosyn 9/7 blood: negative 9/7 tracheal aspirate: moderate Pseudomonas, sens Ceftaz, cipro, zosyn, gent (MIC < 1) 9/7 urine: multiple species  Thank you for allowing pharmacy to be a part of this patient's care.  [employer], Pharm.D., BCPS Clinical Pharmacist Clinical phone for 01/22/2021 from 8:30-4:00 is [phone].  **Pharmacist phone directory can be found on [email or web address] listed under [place].  01/22/2021 1:22 PM

## 2021-01-22 NOTE — Progress Notes (Signed)
Thomas [name]  Assessment/ Plan:  #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 CRRT initiated 7/31 and transitioned to IHD on 12/19/20.  S/p [facility] placement 12/22/20.  Continue with TTS schedule for now.   No sign of renal recovery, need to monitor uop accurately.   HD today -working with PT in regards to sitting in chair/recliner  # Volume overload/anasarca-improved with dialysis.  # Sepsis/aspiration pneumonia/VDRF:aspiration pneumonia.  Completed antibiotics course.  S/p tracheostomy, however pt removed it on 01/01/21 s/p intubation.  New tracheostomy created 01/05/21 by ENT.   # Stage IIIB colon cancer - sp partial colectomy, colostomy. On chemoRx sp 3 cycles.  # Right leg DVT: On Eliquis.  # Atrial fibrillation - on amiodarone and anticoagulation.    # Anemia of critical illness: DC'ed  IV iron as concern for infection and on abx, transfuse as needed, holding erythropoietin with active malignancy.  Monitor hemoglobin and transfuse as needed.  #CKD-MBD; started sevelamer for hyperphosphatemia on 9/8, monitor phosphorus level.  #Disposition - poor overall prognosis.  Seen by palliative care team.  Discussion ongoing for possible transfer to [facility].  The tracheostomy should be capped before he can be accepted to [facility]  Subjective: Seen and examined bedside this AM.  No acute events overnight, no complaints. Objective Vital signs in last 24 hours: Vitals:   01/22/21 0011 01/22/21 0436 01/22/21 0450 01/22/21 0500  BP: 125/77  132/77   Pulse: 98 99 98   Resp: 16 16 17    Temp:      TempSrc:      SpO2: 97% 97% 98%   Weight:    103.4 kg  Height:       Weight change: -1.2 kg  Intake/Output Summary (Last 24 hours) at 01/22/2021 0734 Last data filed at 01/22/2021 0000 Gross per 24 hour  Intake 945 ml  Output 700 ml  Net 245 ml       Labs: Basic Metabolic  Panel: Recent Labs  Lab 01/20/21 0516 01/21/21 0239 01/22/21 0203  NA 134* 134* 133*  K 5.9* 4.5 4.8  CL 90* 89* 87*  CO2 21* 25 23  GLUCOSE 158* 187* 190*  BUN 95* 58* 80*  CREATININE 4.32* 3.20* 3.96*  CALCIUM 9.8 9.3 9.8  PHOS 10.4* 7.9* 10.9*   Liver Function Tests: Recent Labs  Lab 01/17/21 0845 01/18/21 0352 01/19/21 0230 01/20/21 0516 01/21/21 0239 01/22/21 0203  AST 12* 16 14*  --   --   --   ALT 16 16 15   --   --   --   ALKPHOS 103 117 109  --   --   --   BILITOT 0.8 0.9 0.7  --   --   --   PROT 8.2* 8.3* 8.1  --   --   --   ALBUMIN 2.7* 2.8* 2.8*  2.8* 2.8* 2.9* 2.9*   No results for input(s): LIPASE, AMYLASE in the last 168 hours. No results for input(s): AMMONIA in the last 168 hours. CBC: Recent Labs  Lab 01/16/21 0041 01/17/21 0845 01/18/21 0352 01/19/21 0230 01/20/21 0516  WBC 17.5* 14.5* 15.4* 17.8* 15.4*  NEUTROABS 12.8* 10.1* 10.4* 12.9*  --   HGB 10.1* 9.1* 9.8* 9.7* 9.4*  HCT 31.4* 27.7* 29.3* 29.2* 28.1*  MCV 89.0 87.7 86.7 87.2 85.9  PLT 283 282 261 278 339   Cardiac Enzymes: No results for input(s): CKTOTAL, CKMB, CKMBINDEX, TROPONINI in  the last 168 hours. CBG: Recent Labs  Lab 01/21/21 1226 01/21/21 1624 01/21/21 1952 01/21/21 2346 01/22/21 0441  GLUCAP 189* 201* 185* 206* 173*    Iron Studies: No results for input(s): IRON, TIBC, TRANSFERRIN, FERRITIN in the last 72 hours. Studies/Results: No results found.  Medications: Infusions:  sodium chloride Stopped (12/20/20 0955)   anticoagulant sodium citrate     cefTAZidime (FORTAZ)  IV Stopped (01/20/21 1057)   feeding supplement (JEVITY 1.5 CAL/FIBER) 1,000 mL (01/21/21 0952)    Scheduled Medications:  acetaminophen  1,000 mg Per Tube Q8H   amLODipine  5 mg Per Tube Daily   apixaban  5 mg Per Tube BID   vitamin C  500 mg Per Tube TID   atorvastatin  20 mg Per Tube Daily   chlorhexidine gluconate (MEDLINE KIT)  15 mL Mouth Rinse BID   Chlorhexidine Gluconate Cloth   6 each Topical Q0600   Chlorhexidine Gluconate Cloth  6 each Topical Q0600   clonazepam  0.5 mg Per Tube BID   feeding supplement (PROSource TF)  45 mL Per Tube BID   fiber  1 packet Per Tube BID   fluconazole  50 mg Per Tube Daily   gabapentin  100 mg Per Tube Q8H   hydrocerin   Topical BID   insulin aspart  0-15 Units Subcutaneous Q4H   insulin detemir  15 Units Subcutaneous BID   levETIRAcetam  500 mg Per Tube BID   loperamide HCl  1 mg Per Tube QID   mouth rinse  15 mL Mouth Rinse 10 times per day   multivitamin  1 tablet Per Tube QHS   nystatin  5 mL Per Tube QID   QUEtiapine  50 mg Per Tube BID   sevelamer carbonate  2.4 g Per Tube TID WC   thiamine  100 mg Per Tube Daily    have reviewed scheduled and prn medications.  Physical Exam: General: Ill-looking male, with tracheostomy on trach collar, not in distress Heart:RRR, s1s2 nl Lungs: normal wob Abdomen:soft, Non-tender, non-distended Extremities: No edema. Neurology: Alert awake and following commands. Dialysis Access: [facility] in place.   Thomas [name] 01/22/2021,7:34 AM  LOS: 52 days

## 2021-01-22 NOTE — Progress Notes (Signed)
Nutrition Follow-up  DOCUMENTATION CODES:  Non-severe (moderate) malnutrition in context of chronic illness, Obesity unspecified  INTERVENTION:  Continue TF via Cortrak: -Transition to Nepro 1.8 @ 23ml/hr (1312ml/d) -41ml Prosource TF BID  Provides 2456 kcals, 128 grams protein, 961ml free water  - Continue renal MVI daily per tube - Continue NutriSource Fiber BID per tube  - If pt unable to make progress toward diet advancement, recommend considering longer term enteral access (i.e. PEG)  NUTRITION DIAGNOSIS:  Moderate Malnutrition related to chronic illness, cancer and cancer related treatments as evidenced by mild fat depletion, mild muscle depletion, percent weight loss. -- ongoing  GOAL:  Patient will meet greater than or equal to 90% of their needs -- met with TF  MONITOR:  Diet advancement, Labs, Weight trends, TF tolerance, Skin, I & O's  REASON FOR ASSESSMENT:  Consult Enteral/tube feeding initiation and management  ASSESSMENT:  66 y.o. male who is a former with medical history of stage 3 colon cancer s/p partial colectomy and end colostomy on 5/4, recent chemo start with 3rd cycle on 7/22, sleep apnea, seizures, epilepsy, brain tumor, HTN, HLD, GERD, asthma, peripheral neuropathy, urinary frequency, DM, MCA stroke. He presented to the ED via EMS due to abdominal pain x2 weeks, anorexia, and weakness. Admitted for close monitoring for concern of mesenteric ischemia. Patient reported that abdominal pain was worse with eating PTA.  7/31 - CRRT initiated 8/09 - CRRT stopped  8/10 - transfer to [facility] for trach placement and iHD 8/11 - s/p trach 8/12 - Cortrak placed (tip of tube in stomach) 8/15 - s/p placement of [facility] 8/22 - pulled trach out, replaced under bronch guidance, s/p bronch revealing mild area of granulation tissue of top of trach tube 8/25 - pulled trach out, transferred to ICU, emergently intubated 8/29 - trach replaced with ENT in OR 9/04 - tx out of  ICU 9/09 - changed to cuffless trach  Pt remains on trach collar. Continues to be NPO, working with SLP and tolerating PMV. Also continues to tolerate TF via Cortrak per RN. Consider placement of PEG if diet unable to be advanced  Per Nephrology, pt continues to show no sign of renal recovery though is producing some urine -- 3104ml documented x24 hours. Last HD today, net UF 738ml, pt requested to sign out of HD early.   Admission weight: 111.8 kg Current weight/post-HD weight: 101 kg  Current TF: Jevity@ 65 ml/hr, ProSource TF 45 ml BID Will transition pt to Nepro given continued hyperphosphatemia   Medications: vitamin C, nutrisource fiber BID, SSI Q4H, levemir, imodium, renavit, renvela, thiamine  Labs: Na 133 (L), BUN 80 (H), Cr 3.96 (H), PO4 10.9 (H) CBGs: 169-240 x 24 hours  Diet Order:   Diet Order             Diet NPO time specified  Diet effective midnight                  EDUCATION NEEDS:  No education needs have been identified at this time  Skin:  Skin Assessment: Skin Integrity Issues: DTI: upper R and L face Stage II: coccyx, R back, L buttocks Incisions: throat (trach) Other: sloughing to L hand due to hand-foot-and-mouth disease, R thigh non-pressure friction wound  Last BM:  9/15 369ml via colostomy  Height:  Ht Readings from Last 1 Encounters:  01/01/21 $RemoveB'5\' 8"'KBEyrCfO$  (1.727 m)   Weight:  Wt Readings from Last 1 Encounters:  01/22/21 101 kg   Ideal Body Weight:  70 kg  BMI:  Body mass index is 33.86 kg/m.  Estimated Nutritional Needs:  Kcal:  8412-8208 Protein:  120-140 gm Fluid:  1 L + UOP    Larkin Ina, MS, RD, LDN (she/her/hers) RD pager number and weekend/on-call pager number located in [place].

## 2021-01-22 NOTE — Progress Notes (Signed)
OT Cancellation Note  Patient Details Name: Thomas [name] MRN: [id number] DOB: [birth year]   Cancelled Treatment:    Reason Eval/Treat Not Completed: Patient at procedure or test/ unavailable (HD). OT will continue to follow acutely and return as schedule allows  Jaci Carrel 01/22/2021, 11:35 AM  Jesse Sans OTR/L Acute Rehabilitation Services Pager: [phone] Office: [phone]

## 2021-01-22 NOTE — TOC Progression Note (Addendum)
Transition of Care [facility]) - Progression Note    Patient Details  Name: Thomas [name] MRN: [id number] Date of Birth: 1956-02-12  Transition of Care [facility]) CM/SW [place], RN Phone Number: 01/22/2021, 8:06 AM  Clinical Narrative:    Case management spoke with Shireen Quan, CM at [facility] and the facility is willing to review the patient's clinical history and care needs for possible admission to the rehabilitation facility.  I sent a message to [facility] IP Rehab along with therapy to review for admission as well if appropriate.    I spoke to the patient's wife on the phone this morning concerning patient's care needs including physical rehabilitation.  The patient currently has a Cortrak, HD needs and tracheostomy.  The patient's rehabilitation facilities are limited at this time due to his complex medical needs.  The patient's wife was agreeable to CIR placement at either [facility] or [facility] location if either facility is appropriate for the patient's care needs.  CM and MSW with DTP Team will continue to follow the patient for TOC needs.  01/22/2021 1156 - Spoke with Wardell Honour, CM with CIR at [facility] in [facility] and the patient would need to have his trach capped before he could be admitted to their location for CIR.  [facility] that offers Outpatient dialysis to patient's admitted to the facility for rehab would require that the patient have his trach capped and not require suctioning.  CIR in [facility] is willing to follow the patient for possible placement if CIR at [facility] is unable to offer a bed to the patient.  CM sent a message to Marni Griffon with CCM to ask when patient will be appropriate for trach capping trials.    CM is meeting with Danne Baxter, CM with CIR to discuss patient's needs for transitions of care.  01/22/2021 1400 - After speaking with Boyette, CM with [facility] and Hasty, CM at [facility] in [place] - is  patient present trach and need for either decannulation or being capped so that either rehabilitation facility would be able to review and/or offer services to the patient.  PT/OT recommendation would need to include recommendation for CIR if patient is able to tolerate intensive rehab services.  The patient would need to be able to sit for HD sessions as well at either CIR.  01/22/2021 1432 - CM spoke with Albertina Senegal, unit director to discuss progressing the patient up in the chair more often with nursing when possible.  The patient will need possible PEG placement if able. Burt Knack states that patient would benefit from PT/OT sessions to occur on non-HD days as well to help conserve patient's energy.  CM and MSW with DTP will continue to follow the patient for TOC needs.     Expected Discharge Plan: IP Rehab Facility Barriers to Discharge: Continued Medical Work up  Expected Discharge Plan and Services Expected Discharge Plan: [place]   Discharge Planning Services: CM Consult Post Acute Care Choice: IP Rehab (IP Rehab versus SNF for rehab - has tracheostomy and HD care needs.) Living arrangements for the past 2 months: Single Family Home                                       Social Determinants of Health (SDOH) Interventions    Readmission Risk Interventions No flowsheet data found.

## 2021-01-23 DIAGNOSIS — G9341 Metabolic encephalopathy: Secondary | ICD-10-CM | POA: Diagnosis not present

## 2021-01-23 DIAGNOSIS — N171 Acute kidney failure with acute cortical necrosis: Secondary | ICD-10-CM | POA: Diagnosis not present

## 2021-01-23 DIAGNOSIS — E1100 Type 2 diabetes mellitus with hyperosmolarity without nonketotic hyperglycemic-hyperosmolar coma (NKHHC): Secondary | ICD-10-CM | POA: Diagnosis not present

## 2021-01-23 DIAGNOSIS — N170 Acute kidney failure with tubular necrosis: Secondary | ICD-10-CM | POA: Diagnosis not present

## 2021-01-23 LAB — RENAL FUNCTION PANEL
Albumin: 2.8 g/dL — ABNORMAL LOW (ref 3.5–5.0)
Anion gap: 20 — ABNORMAL HIGH (ref 5–15)
BUN: 68 mg/dL — ABNORMAL HIGH (ref 8–23)
CO2: 23 mmol/L (ref 22–32)
Calcium: 9.8 mg/dL (ref 8.9–10.3)
Chloride: 90 mmol/L — ABNORMAL LOW (ref 98–111)
Creatinine, Ser: 3.66 mg/dL — ABNORMAL HIGH (ref 0.61–1.24)
GFR, Estimated: 18 mL/min — ABNORMAL LOW (ref >60)
Glucose, Bld: 188 mg/dL — ABNORMAL HIGH (ref 70–99)
Phosphorus: 9.4 mg/dL — ABNORMAL HIGH (ref 2.5–4.6)
Potassium: 4.6 mmol/L (ref 3.5–5.1)
Sodium: 133 mmol/L — ABNORMAL LOW (ref 135–145)

## 2021-01-23 LAB — CBC
HCT: 26.6 % — ABNORMAL LOW (ref 39.0–52.0)
Hemoglobin: 8.7 g/dL — ABNORMAL LOW (ref 13.0–17.0)
MCH: 28.4 pg (ref 26.0–34.0)
MCHC: 32.7 g/dL (ref 30.0–36.0)
MCV: 86.9 fL (ref 80.0–100.0)
Platelets: 244 10*3/uL (ref 150–400)
RBC: 3.06 MIL/uL — ABNORMAL LOW (ref 4.22–5.81)
RDW: 15.7 % — ABNORMAL HIGH (ref 11.5–15.5)
WBC: 15.7 10*3/uL — ABNORMAL HIGH (ref 4.0–10.5)
nRBC: 0 % (ref 0.0–0.2)

## 2021-01-23 LAB — GLUCOSE, CAPILLARY
Glucose-Capillary: 168 mg/dL — ABNORMAL HIGH (ref 70–99)
Glucose-Capillary: 169 mg/dL — ABNORMAL HIGH (ref 70–99)
Glucose-Capillary: 190 mg/dL — ABNORMAL HIGH (ref 70–99)
Glucose-Capillary: 169 mg/dL — ABNORMAL HIGH (ref 70–99)
Glucose-Capillary: 181 mg/dL — ABNORMAL HIGH (ref 70–99)
Glucose-Capillary: 158 mg/dL — ABNORMAL HIGH (ref 70–99)

## 2021-01-23 NOTE — Progress Notes (Signed)
Palliative Medicine RN Note: Discussed pt in team rounds. Our team has been following Thomas [name] hospital course. Family has been consistently clear that goals are for full aggressive care. Pt is on DTP list.   At this time, there is no role for PMT. Our team will sign off. If his family requests to speak to [place] again, please call our office at [phone].  Thomas Skiff Samba Cumba, RN, BSN, [facility] Palliative Medicine Team 01/23/2021 10:27 AM Office [phone]

## 2021-01-23 NOTE — Progress Notes (Signed)
PROGRESS NOTE    Thomas [name]  PYP:[id number] DOB: 1955-09-01 DOA: 11/30/2020 PCP: [name], NP    Brief Narrative:   Thomas [name] is a 65 y.o. male with a history of colon cancer s/p resection and colostomy May 2022 s/p 3 cycles chemotherapy (last was July 2022), type 2 diabetes mellitus with neuropathy, hypertension, hyperlipidemia presented to the ED on 11/30/2020 with complaints of abdominal pain.  Patient was found to be febrile, tachycardic, tachypneic with elevated lactate and was noted to have sepsis.  Patient was then admitted hospital for acute kidney injury, demand ischemia, hyperglycemia with lactic acidosis.  Patient developed atrial fibrillation with RVR and was started on Cardizem drip.  Was initially admitted to the ICU for septic shock.  His presentation was also thought to be likely secondary to cepacitabine toxicity with oral mucositis, hand/foot syndrome, and diarrhea/high output colostomy.  Patient was transferred to hospitalist service on 7/27 but developed metabolic encephalopathy with hyoxia due to aspiration pneumonia on 12/06/20 requiring intubation and pressor support.  Patient failed extubation with inability to clear secretions, requiring reintubation 12/15/20, and tracheostomy subsequently placed 12/18/20. This was pulled out 8/22, replaced under bronch guidance, again removed 8/25 requiring reintubation after failed reinsertion. ENT revised tracheostomy in the OR on 8/29.  Also required intermittent hemodialysis for acute kidney injury.  Patient also had RLE DVT now on eliquis, colitis thought to be ischemic 12/09/20 not requiring specific intervention.  She also had Pseudomonas pneumonia having completed treatment, metabolic encephalopathy which has improved, anemia requiring 4u PRBCs total, and thrombocytopenia which has resolved. On 9/4, he was transferred to hospitalist service having tolerated trach collar.   Assessment & Plan:   Principal Problem:   Diabetes mellitus  with hyperosmolarity without hyperglycemic hyperosmolar nonketotic coma (HCC) Active Problems:   Lactic acidosis   Acute renal failure superimposed on stage 2 chronic kidney disease (HCC)   Metabolic acidosis   Tachycardia   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 (HCC)   Adverse effect of chemotherapy   Pressure injury of skin   Atrial fibrillation with RVR (HCC)   Sepsis (HCC)   Malnutrition of moderate degree   Acute metabolic encephalopathy   Acute respiratory failure with hypoxemia (HCC)   Acute renal failure with acute cortical necrosis (HCC)   Shock ([place])   Status post tracheostomy ([place])   Endotracheal tube present   Hemodialysis status ([place])   Palliative care by specialist  Acute hypoxic respiratory failure s/p tracheostomy due to aspiration pneumonia with Recurrent pansensitive Pseudomonas tracheobronchitis Continue speech therapy, Continue Fortaz on Tuesday Thursday schedule.  ENT saw the patient during hospitalization changed to cuffless trach on 01/16/2021.     Oral Candida Received Diflucan x 6 doses   Acute renal failure on stage IIIb CKD: Dialysis dependent at this time. Presumed ischemic ATN. Patient was seen by nephrology.  Received CRRT 7/31 > iHD 8/12 s/p left IJ [facility] 12/22/20.  Continue hemodialysis as per nephrology.  Patient is to tolerate sitting up in chair for dialysis treatment.  Not restricted to have renal recovery so we will remain likely to be permanent.   High output ostomy, diarrhea:  Negative C. difficile PCR and GI pathogen panel.     Anemia of critical illness, acute blood loss as well as anemia of chronic disease and iron deficiency:  Patient received 4 units of packed RBC during hospitalization.  Has received IV iron x8 doses.  Latest hemoglobin of 8.7.   Stage IIIb colon  CA s/p partial transverse colectomy with end colostomy May 2022:  Patient was followed by Dr. Learta Codding oncology as outpatient.Buddy Duty CAPOX June 2022,  reduced cepecitabine dose starting 11/19/2020 due to hand/foot syndrome, reduced oxaliplatin due to renal impairment and poor performance status.  Palliative care followed the patient during hospitalization.    Moderate protein calorie malnutrition/dysphagia Continue tube feeds via cortrak tube.  Continue speech therapy evaluation if able to advance diet.  Nutritionist on board.  Nutrition Status: Nutrition Problem: Moderate Malnutrition Etiology: chronic illness, cancer and cancer related treatments Signs/Symptoms: mild fat depletion, mild muscle depletion, percent weight loss Percent weight loss: 11 % Interventions: Prostat, Tube feeding, MVI  Physical deconditioning/back pain --Secondary to prolonged critical illness and myopathy.  Continue physical therapy.  on Oxy IR to 7.5 mg.    Acute extensive right leg DVT:  Diagnosed on 12/12/2020.  Involving CFV, SF junction, proximal femoral and profunda veins and popliteal veins. Continue eliquis   New onset atrial fibrillation with RVR:  Continue Eliquis.  Currently on sinus rhythm.   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  Not been used during hospitalization.   Acute metabolic encephalopathy, agitated delirium: Resolved On Klonopin and Seroquel.  Taper as over time   Uncontrolled T2DM on OHAs prior to admission with associated neuropathy:  HbA1c 8.7% at admission.  Continue Levemir, sliding scale insulin and gabapentin.  Essential HTN:  Continue Norvasc   Hyperlipidemia Continue atorvastatin   Thrombocytopenia:  Transient likely due to critical illness.  HIT antibody negative on 12/12/2020.  Sepsis: ruled out  DVT prophylaxis:  apixaban (ELIQUIS) tablet 5 mg     Code Status: Full Code  Family Communication:  None  Disposition Plan:   Status is: Inpatient  Remains inpatient appropriate because:Unsafe d/c plan, IV treatments  appropriate due to intensity of illness or inability to take PO, and Inpatient level of care appropriate due to severity of illness  Dispo: The patient is from: Home              Anticipated d/c is to: SNF              Patient currently is not medically stable to d/c.   Difficult to place patient yes  Consultants:  Palliative care PCCM General surgery Nephrology Hematology/oncology  Procedures:  2u PRBC 12/07/20, 1u PRBC 8/17, 1u PRBC 8/27 Tracheostomy revision 01/05/2021 Dr. Constance Holster  Antimicrobials:  Vancomycin 7/24 - 7/25, 8/2 - 8/2 Cefepime 7/24 - 7/27 Unasyn 7/30 >> cefepime/flagyl 7/31 - 8/6  Fortaz on TTS w/ HD 9/8>>   Subjective: Today, patient was seen and examined at bedside.  Patient states that he feels okay.  Denies any nausea vomiting pain  Objective: Vitals:   01/23/21 0455 01/23/21 0738 01/23/21 0800 01/23/21 1132  BP: 115/71 117/76 120/71 113/68  Pulse: (!) 105 99 97 100  Resp: _0 Temp:  97.9 F (36.6 C)  97.7 F (36.5 C)  TempSrc:  Axillary  Axillary  SpO2: 99% 99% 99% 98%  Weight:      Height:        Intake/Output Summary (Last 24 hours) at 01/23/2021 1337 Last data filed at 01/23/2021 1244 Gross per 24 hour  Intake 855.48 ml  Output 1350 ml  Net -494.52 ml    Filed Weights   01/22/21 0927 01/22/21 1120 01/23/21 0437  Weight: 101.7 kg 101 kg 103.3 kg   Physical  examination: General:  Average built, not in obvious distress,  HENT:   Tracheostomy tube in place, cortrak tube tube in place Chest: .  Diminished breath sounds bilaterally. No crackles or wheezes.  CVS: S1 &S2 heard. No murmur.  Regular rate and rhythm. Abdomen: Soft, nontender, nondistended.  Bowel sounds are heard.   Extremities: No cyanosis, clubbing or edema.  Peripheral pulses are palpable. Psych: Alert, awake and communicative,  CNS:  No cranial nerve deficits.  Power equal in all extremities.   Skin: Warm and dry.  No rashes noted.  Data Reviewed: I personally  reviewed the following labs and imaging studies.   CBC: Recent Labs  Lab 02-15-2021 0845 01/18/21 0352 01/19/21 0230 01/20/21 0516 01/22/21 1003 01/23/21 0453  WBC 14.5* 15.4* 17.8* 15.4* 15.6* 15.7*  NEUTROABS 10.1* 10.4* 12.9*  --   --   --   HGB 9.1* 9.8* 9.7* 9.4* 9.3* 8.7*  HCT 27.7* 29.3* 29.2* 28.1* 27.5* 26.6*  MCV 87.7 86.7 87.2 85.9 86.5 86.9  PLT 282 261 278 339 260 974    Basic Metabolic Panel: Recent Labs  Lab 02/15/21 0845 01/18/21 0352 01/19/21 0230 01/20/21 0516 01/21/21 0239 01/22/21 0203 01/23/21 0453  NA  --  134* 132*  134* 134* 134* 133* 133*  K  --  4.4 5.3*  5.3* 5.9* 4.5 4.8 4.6  CL  --  90* 91*  93* 90* 89* 87* 90*  CO2  --  _0 21* _1 GLUCOSE  --  169* 192*  189* 158* 187* 190* 188*  BUN  --  38* 67*  65* 95* 58* 80* 68*  CREATININE  --  2.89* 3.74*  3.71* 4.32* 3.20* 3.96* 3.66*  CALCIUM  --  9.2 9.6  9.7 9.8 9.3 9.8 9.8  MG 2.1 2.1 2.1  --   --   --   --   PHOS  --  5.8* 8.2*  8.5* 10.4* 7.9* 10.9* 9.4*    GFR: Estimated Creatinine Clearance: 23.8 mL/min (A) (by C-G formula based on SCr of 3.66 mg/dL (H)). Liver Function Tests: Recent Labs  Lab 2021-02-15 0845 01/18/21 0352 01/19/21 0230 01/20/21 0516 01/21/21 0239 01/22/21 0203 01/23/21 0453  AST 12* 16 14*  --   --   --   --   ALT _2 --   --   --   --   ALKPHOS 103 117 109  --   --   --   --   BILITOT 0.8 0.9 0.7  --   --   --   --   PROT 8.2* 8.3* 8.1  --   --   --   --   ALBUMIN 2.7* 2.8* 2.8*  2.8* 2.8* 2.9* 2.9* 2.8*    No results for input(s): LIPASE, AMYLASE in the last 168 hours. No results for input(s): AMMONIA in the last 168 hours. Coagulation Profile: No results for input(s): INR, PROTIME in the last 168 hours. Cardiac Enzymes: No results for input(s): CKTOTAL, CKMB, CKMBINDEX, TROPONINI in the last 168 hours. BNP (last 3 results) No results for input(s): PROBNP in the last 8760 hours. HbA1C: No results for input(s): HGBA1C in the  last 72 hours. CBG: Recent Labs  Lab 01/23/21 0002 01/23/21 0124 01/23/21 0453 01/23/21 0742 01/23/21 1133  GLUCAP 168* 169* 190* 169* 181*    Lipid Profile: No results for input(s): CHOL, HDL, LDLCALC, TRIG, CHOLHDL, LDLDIRECT in the last 72 hours. Thyroid Function Tests: No  results for input(s): TSH, T4TOTAL, FREET4, T3FREE, THYROIDAB in the last 72 hours. Anemia Panel: No results for input(s): VITAMINB12, FOLATE, FERRITIN, TIBC, IRON, RETICCTPCT in the last 72 hours. Sepsis Labs: No results for input(s): PROCALCITON, LATICACIDVEN in the last 168 hours.   Recent Results (from the past 240 hour(s))  Urine Culture     Status: Abnormal   Collection Time: 01/14/21  8:46 AM   Specimen: In/Out Cath Urine  Result Value Ref Range Status   Specimen Description IN/OUT CATH URINE  Final   Special Requests   Final    NONE Performed at [facility], [address], [place], [place] [address]    Culture MULTIPLE SPECIES PRESENT, SUGGEST RECOLLECTION (A)  Final   Report Status 01/15/2021 FINAL  Final  Culture, blood (Routine X 2) w Reflex to ID Panel     Status: None   Collection Time: 01/14/21 11:21 AM   Specimen: BLOOD  Result Value Ref Range Status   Specimen Description BLOOD RIGHT ANTECUBITAL  Final   Special Requests   Final    BOTTLES DRAWN AEROBIC ONLY Blood Culture adequate volume   Culture   Final    NO GROWTH 5 DAYS Performed at [facility], [place] [address], [place], [place] [address]    Report Status 01/19/2021 FINAL  Final  Culture, blood (Routine X 2) w Reflex to ID Panel     Status: None   Collection Time: 01/14/21 11:22 AM   Specimen: BLOOD  Result Value Ref Range Status   Specimen Description BLOOD LEFT ANTECUBITAL  Final   Special Requests   Final    BOTTLES DRAWN AEROBIC ONLY Blood Culture adequate volume   Culture   Final    NO GROWTH 5 DAYS Performed at [facility], [place] [address] [place], [place] [address]    Report Status  01/19/2021 FINAL  Final  Culture, Respiratory w Gram Stain     Status: None   Collection Time: 01/14/21 11:40 AM   Specimen: Tracheal Aspirate; Respiratory  Result Value Ref Range Status   Specimen Description TRACHEAL ASPIRATE  Final   Special Requests NONE  Final   Gram Stain   Final    ABUNDANT WBC PRESENT,BOTH PMN AND MONONUCLEAR FEW SQUAMOUS EPITHELIAL CELLS PRESENT MODERATE GRAM NEGATIVE RODS FEW GRAM VARIABLE ROD Performed at [facility], [place] [address], [place], [place] [address]    Culture MODERATE PSEUDOMONAS AERUGINOSA  Final   Report Status 01/18/2021 FINAL  Final   Organism ID, Bacteria PSEUDOMONAS AERUGINOSA  Final      Susceptibility   Pseudomonas aeruginosa - MIC*    CEFTAZIDIME <=1 SENSITIVE Sensitive     CIPROFLOXACIN <=0.25 SENSITIVE Sensitive     GENTAMICIN <=1 SENSITIVE Sensitive     PIP/TAZO <=4 SENSITIVE Sensitive     * MODERATE PSEUDOMONAS AERUGINOSA      Radiology Studies: DG Abd 1 View  Result Date: 01/22/2021 CLINICAL DATA:  Vomiting, possible aspiration EXAM: ABDOMEN - 1 VIEW COMPARISON:  12/23/2020 FINDINGS: 2 frontal views of the abdomen and pelvis demonstrates a weighted tip of an enteric feeding catheter overlying the gastric antrum. There is a paucity of bowel gas. No masses or abnormal calcifications. IMPRESSION: 1. Enteric catheter overlying gastric antrum. 2. Paucity of bowel gas. Electronically Signed   By: Randa Ngo M.D.   On: 01/22/2021 20:27   DG CHEST PORT 1 VIEW  Result Date: 01/22/2021 CLINICAL DATA:  Vomiting, possible aspiration EXAM: PORTABLE  CHEST 1 VIEW COMPARISON:  01/18/2021 FINDINGS: Single frontal view of the chest demonstrates right chest wall port and left internal jugular dialysis catheter unchanged. Tracheostomy tube and enteric catheter are stable. Cardiac silhouette is unremarkable. No airspace disease, effusion, or pneumothorax. No acute bony abnormalities. IMPRESSION: 1. Stable support devices.  No acute  process. Electronically Signed   By: Randa Ngo M.D.   On: 01/22/2021 20:26     Scheduled Meds:  acetaminophen  1,000 mg Per Tube Q8H   amLODipine  5 mg Per Tube Daily   apixaban  5 mg Per Tube BID   vitamin C  500 mg Per Tube TID   atorvastatin  20 mg Per Tube Daily   chlorhexidine gluconate (MEDLINE KIT)  15 mL Mouth Rinse BID   Chlorhexidine Gluconate Cloth  6 each Topical Q0600   Chlorhexidine Gluconate Cloth  6 each Topical Q0600   clonazepam  0.5 mg Per Tube BID   feeding supplement (PROSource TF)  45 mL Per Tube BID   fiber  1 packet Per Tube BID   gabapentin  100 mg Per Tube Q8H   hydrocerin   Topical BID   insulin aspart  0-15 Units Subcutaneous Q4H   insulin detemir  15 Units Subcutaneous BID   levETIRAcetam  500 mg Per Tube BID   loperamide HCl  1 mg Per Tube QID   mouth rinse  15 mL Mouth Rinse 10 times per day   multivitamin  1 tablet Per Tube QHS   nystatin  5 mL Per Tube QID   QUEtiapine  50 mg Per Tube BID   sevelamer carbonate  2.4 g Per Tube TID WC   thiamine  100 mg Per Tube Daily   Continuous Infusions:  sodium chloride Stopped (12/20/20 0955)   anticoagulant sodium citrate     cefTAZidime (FORTAZ)  IV Stopped (01/22/21 1257)   feeding supplement (NEPRO CARB STEADY) 55 mL/hr at 01/22/21 2230     LOS: 45 days   Flora Lipps, MD [facility] Available via Epic secure chat 7am-7pm After these hours, please refer to coverage provider listed on [email or web address] 01/23/2021, 1:37 PM

## 2021-01-23 NOTE — Progress Notes (Signed)
Occupational Therapy Treatment Patient Details Name: Thomas [name] MRN: [id number] DOB: 1955/11/13 Today's Date: 01/23/2021   History of present illness 65 y.o. male present to ED 7/24 with high ostomy output. Patient admitted with severe sepsis and multisystem organ failure. Hospital admission complicated by volume overload, aspiration pneumonia, acute respiratory failure, AKI and ischemic colitis. Intubated 7/30. S/p trach placement 8/11. CorTrak 8/12. HD initiated 8/12. To IR 8/15 for tunneled HD cath. Pulled out trach x2 8/22 replaced by PCCM. Pulled out trach again 8/25 but unable to be reinserted with pt intubated. 8/29 to OR for stoma clean up and trach replacement.Trach downsized 9/10.  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   OT comments  Pt progressing towards established OT goals. Pt participating in LB dressing at EOB with Max A for donning socks; demonstrating increased activity tolerance and sitting balance. Pt requiring Min-Mod A +2 for functional transfers. Pt performing oral care while at sink; seated for BUE coordination and then standing to spit in sink. Pt continues to present with decreased balance, strength, and activity tolerance. VSS on RA throughout. Update OT goals. Continue to recommend dc to SNF and will continue to follow acutely as admitted.    Recommendations for follow up therapy are one component of a multi-disciplinary discharge planning process, led by the attending physician.  Recommendations may be updated based on patient status, additional functional criteria and insurance authorization.    Follow Up Recommendations  SNF;Supervision/Assistance - 24 hour    Equipment Recommendations  Other (comment)    Recommendations for Other Services       Precautions / Restrictions Precautions Precautions: Fall Precaution Comments: cortrak, R subclavian tunneled HD cath, ostomy, O2 via trach collar       Mobility Bed Mobility Overal bed mobility: Needs Assistance Bed Mobility: Rolling;Sidelying to Sit Rolling: Min assist;+2 for safety/equipment Sidelying to sit: Mod assist;+2 for safety/equipment       General bed mobility comments: Min A for faciltiating hip to roll. Mod A for managing BLEs and then eleavting trunk    Transfers Overall transfer level: Needs assistance Equipment used: Rolling walker (2 wheeled) Transfers: Sit to/from Stand Sit to Stand: Min assist;+2 safety/equipment (x3) Stand pivot transfers: Mod assist;+2 safety/equipment;+2 physical assistance       General transfer comment: Min A +2 for power up into standing. faciltiating weight shift forward. Min-Mod A for pivot    Balance Overall balance assessment: Needs assistance Sitting-balance support: Feet supported;Bilateral upper extremity supported Sitting balance-Leahy Scale: Good Sitting balance - Comments: bending forward to adjust socks   Standing balance support: Bilateral upper extremity supported;During functional activity Standing balance-Leahy Scale: Poor Standing balance comment: Reliant on UE support and physical A                           ADL either performed or assessed with clinical judgement   ADL Overall ADL's : Needs assistance/impaired     Grooming: Oral care;Minimal assistance;Sitting;Standing Grooming Details (indicate cue type and reason): Pt requiring cues for intiating of sequence to perform oral care. Pt performing sit<>stand twice with Min A +2 to spit in sink       Lower Body Bathing Details (indicate cue type and reason): Pt applying lotion to BLEs while seated at EOB to challenge activity tolerance and  forward bending     Lower Body Dressing: Sit to/from stand;Maximal assistance Lower Body Dressing Details  (indicate cue type and reason): Initating donning of patients socks over his toes. Pt then able to bend forward and attempt to pull sock over heel. However, fatigues quickly and presents with weak grasp; requiring Max A overall for donning socks Toilet Transfer: Minimal assistance;+2 for physical assistance;+2 for safety/equipment;Moderate assistance;Stand-pivot (simulated to recliner) Toilet Transfer Details (indicate cue type and reason): Min A +2 for power up into standing. Min-Mod A for pivot         Functional mobility during ADLs: Minimal assistance;Moderate assistance;+2 for physical assistance;+2 for safety/equipment;Rolling walker (stand pivot) General ADL Comments: Pt performing LB dressing, pivot to recliner, and then oral care while sitting at sink.     Vision   Vision Assessment?: No apparent visual deficits   Perception     Praxis      Cognition Arousal/Alertness: Awake/alert Behavior During Therapy: WFL for tasks assessed/performed;Anxious Overall Cognitive Status: Impaired/Different from baseline Area of Impairment: Problem solving                             Problem Solving: Slow processing;Requires verbal cues General Comments: Requring increased time. Able to follow cues consistently. Requiring increased encouragement        Exercises     Shoulder Instructions       General Comments VSS on RA throughout    Pertinent Vitals/ Pain       Pain Assessment: Faces Faces Pain Scale: Hurts even more Pain Location: back Pain Descriptors / Indicators: Aching;Moaning;Grimacing Pain Intervention(s): Monitored during session;Limited activity within patient's tolerance;Repositioned  Home Living                                          Prior Functioning/Environment              Frequency  Min 2X/week        Progress Toward Goals  OT Goals(current goals can now be found in the care plan section)  Progress towards OT  goals: Progressing toward goals  Acute Rehab OT Goals Patient Stated Goal: to get out of here and get home. OT Goal Formulation: With patient Time For Goal Achievement: 02/06/21 Potential to Achieve Goals: Good ADL Goals Pt Will Perform Grooming: with min assist;sitting Pt Will Perform Upper Body Dressing: with mod assist;sitting Pt Will Perform Lower Body Dressing: with max assist;sitting/lateral leans;sit to/from stand Pt Will Transfer to Toilet: with supervision;ambulating;bedside commode Pt Will Perform Toileting - Clothing Manipulation and hygiene: with supervision;sit to/from stand Pt Will Perform Tub/Shower Transfer: Tub transfer;Shower transfer;3 in Energy manager will Perform Home Exercise Program: Increased ROM;Increased strength;Both right and left upper extremity;Independently Additional ADL Goal #1: Patient will maintain static sitting balance at EOB for 15 min with supervision A in prep for ADLs. Additional ADL Goal #2: [place] will complete sit to stand transfers with Mod A +2 in prep for ADLs. Additional ADL Goal #3: Patient will attend to 2/3 grooming tasks without cues for continuation indicating increased attention to task.  Plan Discharge plan remains appropriate    Co-evaluation    PT/OT/SLP Co-Evaluation/Treatment: Yes Reason for Co-Treatment: Complexity of the patient's impairments (multi-system involvement);To address functional/ADL transfers   OT goals addressed during session: ADL's and self-care      AM-PAC OT "  6 Clicks" Daily Activity     Outcome Measure   Help from another person eating meals?: Total Help from another person taking care of personal grooming?: A Lot Help from another person toileting, which includes using toliet, bedpan, or urinal?: A Lot Help from another person bathing (including washing, rinsing, drying)?: A Lot Help from another person to put on and taking off regular upper body clothing?: Total Help from another  person to put on and taking off regular lower body clothing?: Total 6 Click Score: 9    End of Session Equipment Utilized During Treatment: Gait belt;Rolling walker  OT Visit Diagnosis: Unsteadiness on feet (R26.81);Muscle weakness (generalized) (M62.81);Pain Pain - Right/Left: Right Pain - part of body:  (back)   Activity Tolerance Patient tolerated treatment well   Patient Left with call bell/phone within reach;in chair;with chair alarm set   Nurse Communication Mobility status        Time: 6282-4175 OT Time Calculation (min): 36 min  Charges: OT General Charges $OT Visit: 1 Visit OT Treatments $Self Care/Home Management : 8-22 mins  August, OTR/L Acute Rehab Pager: [phone] Office: [place] 01/23/2021, 4:48 PM

## 2021-01-23 NOTE — Progress Notes (Signed)
Physical Therapy Treatment Patient Details Name: Thomas [name] MRN: [id number] DOB: 20-May-1955 Today's Date: 01/23/2021   History of Present Illness 65 y.o. male present to ED 7/24 with high ostomy output. Patient admitted with severe sepsis and multisystem organ failure. Hospital admission complicated by volume overload, aspiration pneumonia, acute respiratory failure, AKI and ischemic colitis. Intubated 7/30. S/p trach placement 8/11. CorTrak 8/12. HD initiated 8/12. To IR 8/15 for tunneled HD cath. Pulled out trach x2 8/22 replaced by PCCM. Pulled out trach again 8/25 but unable to be reinserted with pt intubated. 8/29 to OR for stoma clean up and trach replacement.Trach downsized 9/10.  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    PT Comments    Pt participated well today.  Notably fatigue, but progressed to a functional activity at the sink.  Emphasis on transitions, transfer with RW and pivotal steps, multiple stands and ended up sitting in chair to improve tolerance.    Recommendations for follow up therapy are one component of a multi-disciplinary discharge planning process, led by the attending physician.  Recommendations may be updated based on patient status, additional functional criteria and insurance authorization.  Follow Up Recommendations  SNF     Equipment Recommendations   (TBD next venue)    Recommendations for Other Services       Precautions / Restrictions Precautions Precautions: Fall Precaution Comments: cortrak, R subclavian tunneled HD cath, ostomy, O2 via trach collar     Mobility  Bed Mobility Overal bed mobility: Needs Assistance Bed Mobility: Rolling;Sidelying to Sit Rolling: Mod assist;+2 for safety/equipment Sidelying to sit: Mod assist;+2  for safety/equipment       General bed mobility comments: relatively flat surface, cues for sequencing, truncal assist to roll with more normal movement and up via L elbow.    Transfers Overall transfer level: Needs assistance Equipment used: Rolling walker (2 wheeled) (or stationary surface.) Transfers: Sit to/from [employer] Sit to Stand: Mod assist (standing x3) Stand pivot transfers: Mod assist;+2 safety/equipment;+2 physical assistance       General transfer comment: Min A +2 for power up into standing. faciltiating weight shift forward. Min-Mod A for pivot  Ambulation/Gait                 Stairs             Wheelchair Mobility    Modified Rankin (Stroke Patients Only)       Balance Overall balance assessment: Needs assistance Sitting-balance support: Feet supported;Bilateral upper extremity supported Sitting balance-Leahy Scale: Good Sitting balance - Comments: bending forward to adjust socks   Standing balance support: Bilateral upper extremity supported;During functional activity Standing balance-Leahy Scale: Poor Standing balance comment: Reliant on UE support and physical A                            Cognition Arousal/Alertness: Awake/alert Behavior During Therapy: WFL for tasks assessed/performed;Anxious Overall Cognitive Status: Within Functional Limits for tasks assessed (but not tested formally) Area of Impairment: Problem solving                             Problem Solving: Slow processing;Requires verbal cues General Comments: Requring increased time. Able to follow cues consistently. Requiring increased encouragement  Exercises      General Comments General comments (skin integrity, edema, etc.): vss on RA throughout.  Trach plus PMV      Pertinent Vitals/Pain Pain Assessment: Faces Faces Pain Scale: Hurts even more Pain Location: back Pain Descriptors / Indicators:  Aching;Moaning;Grimacing Pain Intervention(s): Monitored during session;Limited activity within patient's tolerance    Home Living                      Prior Function            PT Goals (current goals can now be found in the care plan section) Acute Rehab PT Goals Patient Stated Goal: to get out of here and get home. PT Goal Formulation: With patient Time For Goal Achievement: 02/04/21 Potential to Achieve Goals: Good Progress towards PT goals: Progressing toward goals    Frequency    Min 3X/week      PT Plan Current plan remains appropriate    Co-evaluation PT/OT/SLP Co-Evaluation/Treatment: Yes Reason for Co-Treatment: Complexity of the patient's impairments (multi-system involvement);To address functional/ADL transfers PT goals addressed during session: Mobility/safety with mobility OT goals addressed during session: ADL's and self-care;Strengthening/ROM      AM-PAC PT "6 Clicks" Mobility   Outcome Measure  Help needed turning from your back to your side while in a flat bed without using bedrails?: A Lot Help needed moving from lying on your back to sitting on the side of a flat bed without using bedrails?: A Lot Help needed moving to and from a bed to a chair (including a wheelchair)?: Total Help needed standing up from a chair using your arms (e.g., wheelchair or bedside chair)?: A Lot Help needed to walk in hospital room?: Total Help needed climbing 3-5 steps with a railing? : Total 6 Click Score: 9    End of Session Equipment Utilized During Treatment: Oxygen Activity Tolerance: Patient tolerated treatment well;Patient limited by fatigue;Patient limited by pain Patient left: in chair;with call bell/phone within reach Nurse Communication: Mobility status PT Visit Diagnosis: Muscle weakness (generalized) (M62.81);Other abnormalities of gait and mobility (R26.89)     Time: 6283-6629 PT Time Calculation (min) (ACUTE ONLY): 36 min  Charges:   $Therapeutic Activity: 8-22 mins                     01/23/2021  Ginger Carne., PT Acute Rehabilitation Services [phone]  (pager) [phone]  (office)   Tessie Fass Shawnn Bouillon 01/23/2021, 4:57 PM

## 2021-01-23 NOTE — Progress Notes (Signed)
Bladder scan at 1200 showed 417ml of urine. I spoke with Dr. Candiss Norse with nephrology and he ordered foley to be placed due to multiple in and out caths and for close urine output monitoring in a new dialysis patient. I discussed this with the AD, Aldona Bar and she ok' d the foley insertion and instructed me me to have [place], NT assist me with insertion. Insertion went well and produced 465ml of clear, yellow urine.

## 2021-01-23 NOTE — Progress Notes (Signed)
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 CRRT initiated 7/31 and transitioned to IHD on 12/19/20.  S/p [facility] placement 12/22/20.  Continue with TTS schedule for now.   No sign of renal recovery, need to monitor uop accurately.   HD today -working with PT in regards to sitting in chair/recliner  # Volume overload/anasarca-improved with dialysis.  # Sepsis/aspiration pneumonia/VDRF:aspiration pneumonia.  Completed antibiotics course.  S/p tracheostomy, however pt removed it on 01/01/21 s/p intubation.  New tracheostomy created 01/05/21 by ENT.   # Stage IIIB colon cancer - sp partial colectomy, colostomy. On chemoRx sp 3 cycles.  # Right leg DVT: On Eliquis.  # Atrial fibrillation - on amiodarone and anticoagulation.    # Anemia of critical illness: DC'ed  IV iron as concern for infection and on abx, transfuse as needed, holding erythropoietin with active malignancy.  Monitor hemoglobin and transfuse as needed.  #CKD-MBD; started sevelamer for hyperphosphatemia on 9/8, monitor phosphorus level.  #Disposition - poor overall prognosis.  Seen by palliative care team.  Discussion ongoing for possible transfer to [facility]AC/[facility].  The tracheostomy should be capped before he can be accepted to [facility]  Subjective: Seen and examined bedside this AM.  Did not tolerate HD yesterday--abd pain/cramping/tachycardic despite turning off UF and decreasing flow rates. Rinsed back after 1.5hrs, net uf 0.7L. when prompted, pt reports feeling better today Objective Vital signs in last 24 hours: Vitals:   01/23/21 0437 01/23/21 0455 01/23/21 0738 01/23/21 0800  BP:  115/71 117/76 120/71  Pulse:  (!) 105 99 97  Resp:  _0 Temp:   97.9 F (36.6 C)   TempSrc:   Axillary   SpO2:  99% 99% 99%  Weight: 103.3 kg     Height:       Weight change: -1.7  kg  Intake/Output Summary (Last 24 hours) at 01/23/2021 0933 Last data filed at 01/23/2021 0800 Gross per 24 hour  Intake 855.48 ml  Output 713 ml  Net 142.48 ml       Labs: Basic Metabolic Panel: Recent Labs  Lab 01/21/21 0239 01/22/21 0203 01/23/21 0453  NA 134* 133* 133*  K 4.5 4.8 4.6  CL 89* 87* 90*  CO2 _1 GLUCOSE 187* 190* 188*  BUN 58* 80* 68*  CREATININE 3.20* 3.96* 3.66*  CALCIUM 9.3 9.8 9.8  PHOS 7.9* 10.9* 9.4*   Liver Function Tests: Recent Labs  Lab 01/17/21 0845 01/18/21 0352 01/19/21 0230 01/20/21 0516 01/21/21 0239 01/22/21 0203 01/23/21 0453  AST 12* 16 14*  --   --   --   --   ALT _2 --   --   --   --   ALKPHOS 103 117 109  --   --   --   --   BILITOT 0.8 0.9 0.7  --   --   --   --   PROT 8.2* 8.3* 8.1  --   --   --   --   ALBUMIN 2.7* 2.8* 2.8*  2.8*   < > 2.9* 2.9* 2.8*   < > = values in this interval not displayed.   No results for input(s): LIPASE, AMYLASE in the last 168 hours. No results for input(s): AMMONIA in the last 168 hours. CBC: Recent Labs  Lab 01/17/21 0845 01/18/21 0352 01/19/21 0230 01/20/21 0516 01/22/21  1003 01/23/21 0453  WBC 14.5* 15.4* 17.8* 15.4* 15.6* 15.7*  NEUTROABS 10.1* 10.4* 12.9*  --   --   --   HGB 9.1* 9.8* 9.7* 9.4* 9.3* 8.7*  HCT 27.7* 29.3* 29.2* 28.1* 27.5* 26.6*  MCV 87.7 86.7 87.2 85.9 86.5 86.9  PLT 282 261 278 339 260 244   Cardiac Enzymes: No results for input(s): CKTOTAL, CKMB, CKMBINDEX, TROPONINI in the last 168 hours. CBG: Recent Labs  Lab 01/22/21 2118 01/23/21 0002 01/23/21 0124 01/23/21 0453 01/23/21 0742  GLUCAP 154* 168* 169* 190* 169*    Iron Studies: No results for input(s): IRON, TIBC, TRANSFERRIN, FERRITIN in the last 72 hours. Studies/Results: DG Abd 1 View  Result Date: 01/22/2021 CLINICAL DATA:  Vomiting, possible aspiration EXAM: ABDOMEN - 1 VIEW COMPARISON:  12/23/2020 FINDINGS: 2 frontal views of the abdomen and pelvis demonstrates a weighted  tip of an enteric feeding catheter overlying the gastric antrum. There is a paucity of bowel gas. No masses or abnormal calcifications. IMPRESSION: 1. Enteric catheter overlying gastric antrum. 2. Paucity of bowel gas. Electronically Signed   By: Randa Ngo M.D.   On: 01/22/2021 20:27   DG CHEST PORT 1 VIEW  Result Date: 01/22/2021 CLINICAL DATA:  Vomiting, possible aspiration EXAM: PORTABLE CHEST 1 VIEW COMPARISON:  01/18/2021 FINDINGS: Single frontal view of the chest demonstrates right chest wall port and left internal jugular dialysis catheter unchanged. Tracheostomy tube and enteric catheter are stable. Cardiac silhouette is unremarkable. No airspace disease, effusion, or pneumothorax. No acute bony abnormalities. IMPRESSION: 1. Stable support devices.  No acute process. Electronically Signed   By: Randa Ngo M.D.   On: 01/22/2021 20:26    Medications: Infusions:  sodium chloride Stopped (12/20/20 0955)   anticoagulant sodium citrate     cefTAZidime (FORTAZ)  IV Stopped (01/22/21 1257)   feeding supplement (NEPRO CARB STEADY) 55 mL/hr at 01/22/21 2230    Scheduled Medications:  acetaminophen  1,000 mg Per Tube Q8H   amLODipine  5 mg Per Tube Daily   apixaban  5 mg Per Tube BID   vitamin C  500 mg Per Tube TID   atorvastatin  20 mg Per Tube Daily   chlorhexidine gluconate (MEDLINE KIT)  15 mL Mouth Rinse BID   Chlorhexidine Gluconate Cloth  6 each Topical Q0600   Chlorhexidine Gluconate Cloth  6 each Topical Q0600   clonazepam  0.5 mg Per Tube BID   feeding supplement (PROSource TF)  45 mL Per Tube BID   fiber  1 packet Per Tube BID   gabapentin  100 mg Per Tube Q8H   hydrocerin   Topical BID   insulin aspart  0-15 Units Subcutaneous Q4H   insulin detemir  15 Units Subcutaneous BID   levETIRAcetam  500 mg Per Tube BID   loperamide HCl  1 mg Per Tube QID   mouth rinse  15 mL Mouth Rinse 10 times per day   multivitamin  1 tablet Per Tube QHS   nystatin  5 mL Per Tube QID    QUEtiapine  50 mg Per Tube BID   sevelamer carbonate  2.4 g Per Tube TID WC   thiamine  100 mg Per Tube Daily    have reviewed scheduled and prn medications.  Physical Exam: General: Ill-looking male, with tracheostomy on trach collar, not in distress Heart:RRR, s1s2 nl Lungs: normal wob Abdomen:soft, Non-tender, non-distended Extremities: No edema. Neurology: Alert awake and following commands. Dialysis Access: [facility] in place.  Evangelene Vora 01/23/2021,9:33 AM  LOS: 53 days

## 2021-01-24 DIAGNOSIS — R0602 Shortness of breath: Secondary | ICD-10-CM

## 2021-01-24 DIAGNOSIS — R1084 Generalized abdominal pain: Secondary | ICD-10-CM | POA: Diagnosis not present

## 2021-01-24 DIAGNOSIS — E1100 Type 2 diabetes mellitus with hyperosmolarity without nonketotic hyperglycemic-hyperosmolar coma (NKHHC): Secondary | ICD-10-CM | POA: Diagnosis not present

## 2021-01-24 DIAGNOSIS — N171 Acute kidney failure with acute cortical necrosis: Secondary | ICD-10-CM | POA: Diagnosis not present

## 2021-01-24 DIAGNOSIS — J9601 Acute respiratory failure with hypoxia: Secondary | ICD-10-CM | POA: Diagnosis not present

## 2021-01-24 LAB — CBC
HCT: 26.9 % — ABNORMAL LOW (ref 39.0–52.0)
Hemoglobin: 9.3 g/dL — ABNORMAL LOW (ref 13.0–17.0)
MCH: 29.2 pg (ref 26.0–34.0)
MCHC: 34.6 g/dL (ref 30.0–36.0)
MCV: 84.6 fL (ref 80.0–100.0)
Platelets: 241 10*3/uL (ref 150–400)
RBC: 3.18 MIL/uL — ABNORMAL LOW (ref 4.22–5.81)
RDW: 15.1 % (ref 11.5–15.5)
WBC: 15.5 10*3/uL — ABNORMAL HIGH (ref 4.0–10.5)
nRBC: 0 % (ref 0.0–0.2)

## 2021-01-24 LAB — BASIC METABOLIC PANEL
Anion gap: 25 — ABNORMAL HIGH (ref 5–15)
BUN: 89 mg/dL — ABNORMAL HIGH (ref 8–23)
CO2: 18 mmol/L — ABNORMAL LOW (ref 22–32)
Calcium: 10 mg/dL (ref 8.9–10.3)
Chloride: 89 mmol/L — ABNORMAL LOW (ref 98–111)
Creatinine, Ser: 3.87 mg/dL — ABNORMAL HIGH (ref 0.61–1.24)
GFR, Estimated: 17 mL/min — ABNORMAL LOW (ref >60)
Glucose, Bld: 154 mg/dL — ABNORMAL HIGH (ref 70–99)
Potassium: 4.2 mmol/L (ref 3.5–5.1)
Sodium: 132 mmol/L — ABNORMAL LOW (ref 135–145)

## 2021-01-24 LAB — MAGNESIUM: Magnesium: 2.4 mg/dL (ref 1.7–2.4)

## 2021-01-24 LAB — ALBUMIN: Albumin: 2.7 g/dL — ABNORMAL LOW (ref 3.5–5.0)

## 2021-01-24 LAB — GLUCOSE, CAPILLARY
Glucose-Capillary: 151 mg/dL — ABNORMAL HIGH (ref 70–99)
Glucose-Capillary: 155 mg/dL — ABNORMAL HIGH (ref 70–99)
Glucose-Capillary: 156 mg/dL — ABNORMAL HIGH (ref 70–99)
Glucose-Capillary: 162 mg/dL — ABNORMAL HIGH (ref 70–99)
Glucose-Capillary: 163 mg/dL — ABNORMAL HIGH (ref 70–99)
Glucose-Capillary: 164 mg/dL — ABNORMAL HIGH (ref 70–99)
Glucose-Capillary: 178 mg/dL — ABNORMAL HIGH (ref 70–99)

## 2021-01-24 LAB — PHOSPHORUS: Phosphorus: 10.8 mg/dL — ABNORMAL HIGH (ref 2.5–4.6)

## 2021-01-24 MED ORDER — GLYCOPYRROLATE 0.2 MG/ML IJ SOLN
0.1000 mg | INTRAMUSCULAR | Status: DC | PRN
  Administered 2021-01-30 – 2021-02-24 (×8): 0.1 mg via INTRAVENOUS
  Filled 2021-01-24 (×9): qty 1

## 2021-01-24 MED ORDER — ONDANSETRON HCL 4 MG/2ML IJ SOLN
4.0000 mg | Freq: Four times a day (QID) | INTRAMUSCULAR | Status: DC | PRN
  Administered 2021-01-24 – 2021-02-03 (×10): 4 mg via INTRAVENOUS
  Filled 2021-01-24 (×11): qty 2

## 2021-01-24 NOTE — Progress Notes (Signed)
PROGRESS NOTE    TELLIS SPIVAK  ZOX:[id number] DOB: 08-21-1955 DOA: 11/30/2020 PCP: [name], NP    Brief Narrative:   Thomas [name] is a 65 y.o. male with a history of colon cancer s/p resection and colostomy May 2022 s/p 3 cycles chemotherapy (last was July 2022), type 2 diabetes mellitus with neuropathy, hypertension, hyperlipidemia presented to the ED on 11/30/2020 with complaints of abdominal pain.  Patient was found to be febrile, tachycardic, tachypneic with elevated lactate and was noted to have sepsis.  Patient was then admitted hospital for acute kidney injury, demand ischemia, hyperglycemia with lactic acidosis.  Patient developed atrial fibrillation with RVR and was started on Cardizem drip.  Was initially admitted to the ICU for septic shock.  His presentation was also thought to be likely secondary to cepacitabine toxicity with oral mucositis, hand/foot syndrome, and diarrhea/high output colostomy.  Patient was transferred to hospitalist service on 7/27 but developed metabolic encephalopathy with hyoxia due to aspiration pneumonia on 12/06/20 requiring intubation and pressor support.  Patient failed extubation with inability to clear secretions, requiring reintubation 12/15/20, and tracheostomy subsequently placed 12/18/20. This was pulled out 8/22, replaced under bronch guidance, again removed 8/25 requiring reintubation after failed reinsertion. ENT revised tracheostomy in the OR on 8/29.  Also required intermittent hemodialysis for acute kidney injury.  Patient also had RLE DVT now on eliquis, colitis thought to be ischemic 12/09/20 not requiring specific intervention.  He also had Pseudomonas pneumonia having completed treatment, metabolic encephalopathy which has improved, anemia requiring 4u PRBCs total, and thrombocytopenia which has resolved. On 9/4, he was transferred to hospitalist service having tolerated trach collar.  Assessment & Plan:   Principal Problem:   Diabetes mellitus  with hyperosmolarity without hyperglycemic hyperosmolar nonketotic coma (HCC) Active Problems:   Lactic acidosis   Acute renal failure superimposed on stage 2 chronic kidney disease (HCC)   Metabolic acidosis   Tachycardia   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 (HCC)   Adverse effect of chemotherapy   Pressure injury of skin   Atrial fibrillation with RVR (HCC)   Sepsis (HCC)   Malnutrition of moderate degree   Acute metabolic encephalopathy   Acute respiratory failure with hypoxemia (HCC)   Acute renal failure with acute cortical necrosis (HCC)   Shock ([place])   Status post tracheostomy ([place])   Endotracheal tube present   Hemodialysis status ([place])   Palliative care by specialist  Acute hypoxic respiratory failure s/p tracheostomy due to aspiration pneumonia with Recurrent pansensitive Pseudomonas tracheobronchitis Continue speech therapy, Continue Fortaz on Tuesday Thursday schedule.  ENT saw the patient during hospitalization changed to cuffless trach on 01/16/2021.  He has significant clear secretions from trachea causing anxiety. - robinol for secretions - PT/OT - trach maintenance   Acute renal failure on stage IIIb CKD with oliguria: dialysis today was not complete due to anxiety/pain and inability to tolerate full treatment - oxy IR PRN - consider fentanyl patch day prior to next dialysis session (Tuesday)   High output ostomy, diarrhea:  Negative C. difficile PCR and GI pathogen panel.     Anemia of critical illness, acute blood loss as well as anemia of chronic disease and iron deficiency:  Patient received 4 units of packed RBC during hospitalization.  Has received IV iron x8 doses.  Hgb improved to 9.3 today.   Stage IIIb colon CA s/p partial transverse colectomy with end colostomy May 2022:  Patient was followed by Dr. Learta Codding oncology as  outpatient.Buddy Duty CAPOX June 2022, reduced cepecitabine dose starting 11/19/2020 due to hand/foot  syndrome, reduced oxaliplatin due to renal impairment and poor performance status.  Palliative care followed the patient during hospitalization.    Moderate protein calorie malnutrition/dysphagia Continue tube feeds via cortrak tube.  Continue speech therapy evaluation if able to advance diet.  Nutritionist on board.  Nutrition Status: Nutrition Problem: Moderate Malnutrition Etiology: chronic illness, cancer and cancer related treatments Signs/Symptoms: mild fat depletion, mild muscle depletion, percent weight loss Percent weight loss: 11 % Interventions: Prostat, Tube feeding, MVI  Physical deconditioning/back pain --Secondary to prolonged critical illness and myopathy.  Continue physical therapy.  on Oxy IR to 7.5 mg.    Acute extensive right leg DVT:  Diagnosed on 12/12/2020.  Involving CFV, SF junction, proximal femoral and profunda veins and popliteal veins. Continue eliquis   New onset atrial fibrillation with RVR:  Continue Eliquis.  Currently on sinus rhythm.   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  Not been used during hospitalization.   Acute metabolic encephalopathy, agitated delirium: Resolved On Klonopin and Seroquel.  Taper as tolerated over time   Uncontrolled T2DM on OHAs prior to admission with associated neuropathy:  HbA1c 8.7% at admission.  Continue Levemir, sliding scale insulin and gabapentin.  Essential HTN: well controlled Continue Norvasc   Hyperlipidemia Continue atorvastatin   Thrombocytopenia:  Transient likely due to critical illness.  HIT antibody negative on 12/12/2020.  Sepsis: ruled out  DVT prophylaxis:  apixaban (ELIQUIS) tablet 5 mg     Code Status: Full Code  Family Communication:  None  Disposition Plan:   Status is: Inpatient  Remains inpatient appropriate because:Unsafe d/c plan, IV treatments appropriate due to intensity of illness or  inability to take PO, and Inpatient level of care appropriate due to severity of illness  Dispo: The patient is from: Home              Anticipated d/c is to: SNF              Patient currently is not medically stable to d/c.   Difficult to place patient yes  Consultants:  Palliative care PCCM General surgery Nephrology Hematology/oncology  Procedures:  2u PRBC 12/07/20, 1u PRBC 8/17, 1u PRBC 8/27 Tracheostomy revision 01/05/2021 Dr. Constance Holster HD 9/17 latest  Antimicrobials:  Vancomycin 7/24 - 7/25, 8/2 - 8/2 Cefepime 7/24 - 7/27 Unasyn 7/30 >> cefepime/flagyl 7/31 - 8/6  Fortaz on TTS w/ HD 9/8>>   Subjective: Patient feels abdominal pain and anxiety. Excessive trachea secretions.   Objective: Vitals:   01/23/21 2300 01/23/21 2330 01/24/21 0312 01/24/21 0400  BP: 122/83 126/71 (!) 141/82 128/68  Pulse: 93 93 93 93  Resp: 20 17 15 16   Temp:  (!) 97.3 F (36.3 C)  98.2 F (36.8 C)  TempSrc:  Axillary  Axillary  SpO2: 97% 97% 97% 97%  Weight:      Height:        Intake/Output Summary (Last 24 hours) at 01/24/2021 0731 Last data filed at 01/23/2021 1722 Gross per 24 hour  Intake 275 ml  Output 1450 ml  Net -1175 ml    Filed Weights   01/22/21 0927 01/22/21 1120 01/23/21 0437  Weight: 101.7 kg 101 kg 103.3 kg   Physical examination: General:  obese, in acute distress with stable vital signs.  HENT:   Tracheostomy tube in place, cortrak tube in place Chest: .  Diminished breath sounds bilaterally. No crackles or wheezes.  CVS: S1 &S2 heard. No murmur.  Regular rate and rhythm. Abdomen: Soft, nontender, nondistended.  Extremities: No cyanosis, clubbing or edema.  Peripheral pulses are palpable. 1+ edema LEs Psych: Alert, anxious with excessive anxiety intermittently with breathing secretions CNS:  alert and oriented. Communicating appropriately Skin: Warm and dry.  No rashes noted.  Data Reviewed: I personally reviewed the following labs and imaging studies.    CBC: Recent Labs  Lab 01/17/21 0845 01/18/21 0352 01/19/21 0230 01/20/21 0516 01/22/21 1003 01/23/21 0453 01/24/21 0122  WBC 14.5* 15.4* 17.8* 15.4* 15.6* 15.7* 15.5*  NEUTROABS 10.1* 10.4* 12.9*  --   --   --   --   HGB 9.1* 9.8* 9.7* 9.4* 9.3* 8.7* 9.3*  HCT 27.7* 29.3* 29.2* 28.1* 27.5* 26.6* 26.9*  MCV 87.7 86.7 87.2 85.9 86.5 86.9 84.6  PLT 282 261 278 339 260 244 710    Basic Metabolic Panel: Recent Labs  Lab 01/17/21 0845 01/18/21 0352 01/18/21 0352 01/19/21 0230 01/20/21 0516 01/21/21 0239 01/22/21 0203 01/23/21 0453 01/24/21 0122  NA  --  134*   < > 132*  134* 134* 134* 133* 133* 132*  K  --  4.4   < > 5.3*  5.3* 5.9* 4.5 4.8 4.6 4.2  CL  --  90*   < > 91*  93* 90* 89* 87* 90* 89*  CO2  --  24   < > 22  24 21* 25 23 23  18*  GLUCOSE  --  169*   < > 192*  189* 158* 187* 190* 188* 154*  BUN  --  38*   < > 67*  65* 95* 58* 80* 68* 89*  CREATININE  --  2.89*   < > 3.74*  3.71* 4.32* 3.20* 3.96* 3.66* 3.87*  CALCIUM  --  9.2   < > 9.6  9.7 9.8 9.3 9.8 9.8 10.0  MG 2.1 2.1  --  2.1  --   --   --   --  2.4  PHOS  --  5.8*   < > 8.2*  8.5* 10.4* 7.9* 10.9* 9.4* 10.8*   < > = values in this interval not displayed.    GFR: Estimated Creatinine Clearance: 22.5 mL/min (A) (by C-G formula based on SCr of 3.87 mg/dL (H)). Liver Function Tests: Recent Labs  Lab 01/17/21 0845 01/18/21 0352 01/19/21 0230 01/20/21 0516 01/21/21 0239 01/22/21 0203 01/23/21 0453 01/24/21 0122  AST 12* 16 14*  --   --   --   --   --   ALT 16 16 15   --   --   --   --   --   ALKPHOS 103 117 109  --   --   --   --   --   BILITOT 0.8 0.9 0.7  --   --   --   --   --   PROT 8.2* 8.3* 8.1  --   --   --   --   --   ALBUMIN 2.7* 2.8* 2.8*  2.8* 2.8* 2.9* 2.9* 2.8* 2.7*    CBG: Recent Labs  Lab 01/23/21 0124 01/23/21 0453 01/23/21 0742 01/23/21 1133 01/23/21 1527  GLUCAP 169* 190* 169* 181* 158*    Recent Results (from the past 240 hour(s))  Urine Culture     Status:  Abnormal   Collection Time: 01/14/21  8:46 AM   Specimen: In/Out Cath Urine  Result Value Ref  Range Status   Specimen Description IN/OUT CATH URINE  Final   Special Requests   Final    NONE Performed at [facility], [place] [address] [place], [place] [address]    Culture MULTIPLE SPECIES PRESENT, SUGGEST RECOLLECTION (A)  Final   Report Status 01/15/2021 FINAL  Final  Culture, blood (Routine X 2) w Reflex to ID Panel     Status: None   Collection Time: 01/14/21 11:21 AM   Specimen: BLOOD  Result Value Ref Range Status   Specimen Description BLOOD RIGHT ANTECUBITAL  Final   Special Requests   Final    BOTTLES DRAWN AEROBIC ONLY Blood Culture adequate volume   Culture   Final    NO GROWTH 5 DAYS Performed at [facility], [place] [address], [place], [place] [address]    Report Status 01/19/2021 FINAL  Final  Culture, blood (Routine X 2) w Reflex to ID Panel     Status: None   Collection Time: 01/14/21 11:22 AM   Specimen: BLOOD  Result Value Ref Range Status   Specimen Description BLOOD LEFT ANTECUBITAL  Final   Special Requests   Final    BOTTLES DRAWN AEROBIC ONLY Blood Culture adequate volume   Culture   Final    NO GROWTH 5 DAYS Performed at [facility], [place] [address] [place], [place] [address]    Report Status 01/19/2021 FINAL  Final  Culture, Respiratory w Gram Stain     Status: None   Collection Time: 01/14/21 11:40 AM   Specimen: Tracheal Aspirate; Respiratory  Result Value Ref Range Status   Specimen Description TRACHEAL ASPIRATE  Final   Special Requests NONE  Final   Gram Stain   Final    ABUNDANT WBC PRESENT,BOTH PMN AND MONONUCLEAR FEW SQUAMOUS EPITHELIAL CELLS PRESENT MODERATE GRAM NEGATIVE RODS FEW GRAM VARIABLE ROD Performed at [facility], [place] [address], [place], [place] [address]    Culture MODERATE PSEUDOMONAS AERUGINOSA  Final   Report Status 01/18/2021 FINAL  Final   Organism ID, Bacteria PSEUDOMONAS AERUGINOSA  Final       Susceptibility   Pseudomonas aeruginosa - MIC*    CEFTAZIDIME <=1 SENSITIVE Sensitive     CIPROFLOXACIN <=0.25 SENSITIVE Sensitive     GENTAMICIN <=1 SENSITIVE Sensitive     PIP/TAZO <=4 SENSITIVE Sensitive     * MODERATE PSEUDOMONAS AERUGINOSA    Radiology Studies: DG Abd 1 View  Result Date: 01/22/2021 CLINICAL DATA:  Vomiting, possible aspiration EXAM: ABDOMEN - 1 VIEW COMPARISON:  12/23/2020 FINDINGS: 2 frontal views of the abdomen and pelvis demonstrates a weighted tip of an enteric feeding catheter overlying the gastric antrum. There is a paucity of bowel gas. No masses or abnormal calcifications. IMPRESSION: 1. Enteric catheter overlying gastric antrum. 2. Paucity of bowel gas. Electronically Signed   By: Randa Ngo M.D.   On: 01/22/2021 20:27   DG CHEST PORT 1 VIEW  Result Date: 01/22/2021 CLINICAL DATA:  Vomiting, possible aspiration EXAM: PORTABLE CHEST 1 VIEW COMPARISON:  01/18/2021 FINDINGS: Single frontal view of the chest demonstrates right chest wall port and left internal jugular dialysis catheter unchanged. Tracheostomy tube and enteric catheter are stable. Cardiac silhouette is unremarkable. No airspace disease, effusion, or pneumothorax. No acute bony abnormalities. IMPRESSION: 1. Stable support devices.  No acute process. Electronically Signed   By: Randa Ngo M.D.   On: 01/22/2021 20:26     Scheduled Meds:  acetaminophen  1,000 mg Per  Tube Q8H   amLODipine  5 mg Per Tube Daily   apixaban  5 mg Per Tube BID   vitamin C  500 mg Per Tube TID   atorvastatin  20 mg Per Tube Daily   chlorhexidine gluconate (MEDLINE KIT)  15 mL Mouth Rinse BID   Chlorhexidine Gluconate Cloth  6 each Topical Q0600   Chlorhexidine Gluconate Cloth  6 each Topical Q0600   clonazepam  0.5 mg Per Tube BID   feeding supplement (PROSource TF)  45 mL Per Tube BID   fiber  1 packet Per Tube BID   gabapentin  100 mg Per Tube Q8H   hydrocerin   Topical BID   insulin aspart  0-15 Units  Subcutaneous Q4H   insulin detemir  15 Units Subcutaneous BID   levETIRAcetam  500 mg Per Tube BID   loperamide HCl  1 mg Per Tube QID   mouth rinse  15 mL Mouth Rinse 10 times per day   multivitamin  1 tablet Per Tube QHS   nystatin  5 mL Per Tube QID   QUEtiapine  50 mg Per Tube BID   sevelamer carbonate  2.4 g Per Tube TID WC   thiamine  100 mg Per Tube Daily   Continuous Infusions:  sodium chloride Stopped (12/20/20 0955)   anticoagulant sodium citrate     cefTAZidime (FORTAZ)  IV Stopped (01/22/21 1257)   feeding supplement (NEPRO CARB STEADY) 1,000 mL (01/23/21 1631)     LOS: 97 days   Richarda Osmond, MD [facility] Available via Epic secure chat 7am-7pm After these hours, please refer to coverage provider listed on [email or web address] 01/24/2021, 7:31 AM

## 2021-01-24 NOTE — Progress Notes (Signed)
[facility] NEPHROLOGY PROGRESS NOTE  Assessment/ Plan:  #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 CRRT initiated 7/31 and transitioned to IHD on 12/19/20.  S/p TDC placement 12/22/20.  Continue with TTS schedule for now.   need to monitor uop accurately.   -working with PT in regards to sitting in chair/recliner -possibly heading towards renal recovery? Good urine output, maintain foley. Will not UF as much today, decrease flow rates.  #Urinary retention -foley placed 9/16 -strict I/o  # Volume overload/anasarca-improved with dialysis.  # Sepsis/aspiration pneumonia/VDRF:aspiration pneumonia.  Completed antibiotics course.  S/p tracheostomy, however pt removed it on 01/01/21 s/p intubation.  New tracheostomy created 01/05/21 by ENT.   # Stage IIIB colon cancer - sp partial colectomy, colostomy. On chemoRx sp 3 cycles.  # Right leg DVT: On Eliquis.  # Atrial fibrillation - on amiodarone and anticoagulation.    # Anemia of critical illness: DC'ed  IV iron as concern for infection and on abx, transfuse as needed, holding erythropoietin with active malignancy.  Monitor hemoglobin and transfuse as needed.  #CKD-MBD; started sevelamer for hyperphosphatemia on 9/8, monitor phosphorus level.  #Disposition - poor overall prognosis.  Seen by palliative care team.  Discussion ongoing for possible transfer to [facility].  The tracheostomy should be capped before he can be accepted to [facility]  Subjective: Seen and examined bedside this AM.  Urinary retention 9/16, urine output 1.4 L.  Foley placed.  No complaints, no acute events Objective Vital signs in last 24 hours: Vitals:   01/24/21 0400 01/24/21 0748 01/24/21 0827 01/24/21 0839  BP: 128/68 135/66    Pulse: 93 93 93   Resp: 16 15 18   Temp: 98.2 F (36.8 C) 98.4 F (36.9 C)    TempSrc: Axillary Axillary    SpO2: 97% 98% 99% 99%  Weight:       Height:       Weight change:   Intake/Output Summary (Last 24 hours) at 01/24/2021 0851 Last data filed at 01/23/2021 1722 Gross per 24 hour  Intake 220 ml  Output 1450 ml  Net -1230 ml       Labs: Basic Metabolic Panel: Recent Labs  Lab 01/22/21 0203 01/23/21 0453 01/24/21 0122  NA 133* 133* 132*  K 4.8 4.6 4.2  CL 87* 90* 89*  CO2 23 23 18*  GLUCOSE 190* 188* 154*  BUN 80* 68* 89*  CREATININE 3.96* 3.66* 3.87*  CALCIUM 9.8 9.8 10.0  PHOS 10.9* 9.4* 10.8*   Liver Function Tests: Recent Labs  Lab 01/18/21 0352 01/19/21 0230 01/20/21 0516 01/22/21 0203 01/23/21 0453 01/24/21 0122  AST 16 14*  --   --   --   --   ALT 16 15  --   --   --   --   ALKPHOS 117 109  --   --   --   --   BILITOT 0.9 0.7  --   --   --   --   PROT 8.3* 8.1  --   --   --   --   ALBUMIN 2.8* 2.8*  2.8*   < > 2.9* 2.8* 2.7*   < > = values in this interval not displayed.   No results for input(s): LIPASE, AMYLASE in the last 168 hours. No results for input(s): AMMONIA in the last 168 hours. CBC: Recent Labs  Lab 01/18/21 0352 01/19/21 0230 01/20/21 0516 01/22/21 1003 01/23/21   7824 01/24/21 0122  WBC 15.4* 17.8* 15.4* 15.6* 15.7* 15.5*  NEUTROABS 10.4* 12.9*  --   --   --   --   HGB 9.8* 9.7* 9.4* 9.3* 8.7* 9.3*  HCT 29.3* 29.2* 28.1* 27.5* 26.6* 26.9*  MCV 86.7 87.2 85.9 86.5 86.9 84.6  PLT 261 278 339 260 244 241   Cardiac Enzymes: No results for input(s): CKTOTAL, CKMB, CKMBINDEX, TROPONINI in the last 168 hours. CBG: Recent Labs  Lab 01/23/21 1527 01/23/21 1959 01/23/21 2331 01/24/21 0401 01/24/21 0747  GLUCAP 158* 164* 156* 151* 155*    Iron Studies: No results for input(s): IRON, TIBC, TRANSFERRIN, FERRITIN in the last 72 hours. Studies/Results: DG Abd 1 View  Result Date: 01/22/2021 CLINICAL DATA:  Vomiting, possible aspiration EXAM: ABDOMEN - 1 VIEW COMPARISON:  12/23/2020 FINDINGS: 2 frontal views of the abdomen and pelvis demonstrates a weighted tip of an  enteric feeding catheter overlying the gastric antrum. There is a paucity of bowel gas. No masses or abnormal calcifications. IMPRESSION: 1. Enteric catheter overlying gastric antrum. 2. Paucity of bowel gas. Electronically Signed   By: Randa Ngo M.D.   On: 01/22/2021 20:27   DG CHEST PORT 1 VIEW  Result Date: 01/22/2021 CLINICAL DATA:  Vomiting, possible aspiration EXAM: PORTABLE CHEST 1 VIEW COMPARISON:  01/18/2021 FINDINGS: Single frontal view of the chest demonstrates right chest wall port and left internal jugular dialysis catheter unchanged. Tracheostomy tube and enteric catheter are stable. Cardiac silhouette is unremarkable. No airspace disease, effusion, or pneumothorax. No acute bony abnormalities. IMPRESSION: 1. Stable support devices.  No acute process. Electronically Signed   By: Randa Ngo M.D.   On: 01/22/2021 20:26    Medications: Infusions:  sodium chloride Stopped (12/20/20 0955)   anticoagulant sodium citrate     cefTAZidime (FORTAZ)  IV Stopped (01/22/21 1257)   feeding supplement (NEPRO CARB STEADY) 1,000 mL (01/23/21 1631)    Scheduled Medications:  acetaminophen  1,000 mg Per Tube Q8H   amLODipine  5 mg Per Tube Daily   apixaban  5 mg Per Tube BID   vitamin C  500 mg Per Tube TID   atorvastatin  20 mg Per Tube Daily   chlorhexidine gluconate (MEDLINE KIT)  15 mL Mouth Rinse BID   Chlorhexidine Gluconate Cloth  6 each Topical Q0600   Chlorhexidine Gluconate Cloth  6 each Topical Q0600   clonazepam  0.5 mg Per Tube BID   feeding supplement (PROSource TF)  45 mL Per Tube BID   fiber  1 packet Per Tube BID   gabapentin  100 mg Per Tube Q8H   hydrocerin   Topical BID   insulin aspart  0-15 Units Subcutaneous Q4H   insulin detemir  15 Units Subcutaneous BID   levETIRAcetam  500 mg Per Tube BID   loperamide HCl  1 mg Per Tube QID   mouth rinse  15 mL Mouth Rinse 10 times per day   multivitamin  1 tablet Per Tube QHS   nystatin  5 mL Per Tube QID    QUEtiapine  50 mg Per Tube BID   sevelamer carbonate  2.4 g Per Tube TID WC   thiamine  100 mg Per Tube Daily    have reviewed scheduled and prn medications.  Physical Exam: General: Ill-looking male, with tracheostomy on trach collar, not in distress Heart:RRR, s1s2 nl Lungs: normal wob Abdomen:soft, Non-tender, non-distended Extremities: No edema. Neurology: Alert awake and following commands. Dialysis Access: [facility] in place.    01/24/2021,8:51 AM  LOS: 54 days   

## 2021-01-24 NOTE — Progress Notes (Signed)
   01/24/21 1150  Vitals  Temp 97.6 F (36.4 C)  Temp Source Oral  BP 132/62  BP Location Left Arm  BP Method Automatic  Patient Position (if appropriate) Lying  Pulse Rate (!) 107  Pulse Rate Source Monitor  ECG Heart Rate (!) 107  Resp (!) 25  Oxygen Therapy  SpO2 100 %  O2 Device Room Air  Dialysis Weight  Weight 104.4 kg  Type of Weight Post-Dialysis  Post-Hemodialysis Assessment  Rinseback Volume (mL) 250 mL  KECN 246 V  Dialyzer Clearance Lightly streaked  Duration of HD Treatment -hour(s) 1.25 hour(s)  Hemodialysis Intake (mL) 500 mL  UF Total -Machine (mL) 1409 mL  Net UF (mL) 909 mL  Tolerated HD Treatment No (Comment) (tx terminated with 25 minutes left, pt extremely anxious and restless, HR and resps increasing due to pt not remaining still, denies pain)  Post-Hemodialysis Comments  (tx complete-pt stable)  Hemodialysis Catheter Left Subclavian Double lumen Permanent (Tunneled)  Placement Date/Time: 12/22/20 1141   Time Out: Correct patient;Correct site;Correct procedure  Maximum sterile barrier precautions: Hand hygiene;Cap;Mask  Site Prep: Chlorhexidine (preferred)  Local Anesthetic: Injectable - 1% Lidocaine  Ultrasound [place]...  Site Condition No complications  Blue Lumen Status Flushed;Dead end cap in place  Red Lumen Status Flushed;Dead end cap in place  Dressing Type Transparent  Dressing Status Clean;Dry;Intact  Antimicrobial disc in place? Yes  Interventions Antimicrobial disc changed;Dressing changed;New dressing  Drainage Description None  Dressing Change Due 01/31/21  Post treatment catheter status Capped and Clamped  HD terminated with 25 minutes left. Pt anxious and progressively worsening throughout tx. Pt denies pain. Primary nurse contacted and scheduled klonopin administered-no effects noted. Pt restless, causing high dialysis pressures, tachycardia and tachypnea. Pt able to stop briefly when conversing with staff, and reports that he is okay, but  once staff walks away pt begans again. Dr. Candiss Norse notified of the above, received orders to discontinue tx. Primary nurse notified.

## 2021-01-25 DIAGNOSIS — E1100 Type 2 diabetes mellitus with hyperosmolarity without nonketotic hyperglycemic-hyperosmolar coma (NKHHC): Secondary | ICD-10-CM | POA: Diagnosis not present

## 2021-01-25 DIAGNOSIS — N170 Acute kidney failure with tubular necrosis: Secondary | ICD-10-CM | POA: Diagnosis not present

## 2021-01-25 DIAGNOSIS — J9601 Acute respiratory failure with hypoxia: Secondary | ICD-10-CM | POA: Diagnosis not present

## 2021-01-25 DIAGNOSIS — Z992 Dependence on renal dialysis: Secondary | ICD-10-CM

## 2021-01-25 DIAGNOSIS — G9341 Metabolic encephalopathy: Secondary | ICD-10-CM | POA: Diagnosis not present

## 2021-01-25 LAB — GLUCOSE, CAPILLARY
Glucose-Capillary: 179 mg/dL — ABNORMAL HIGH (ref 70–99)
Glucose-Capillary: 124 mg/dL — ABNORMAL HIGH (ref 70–99)
Glucose-Capillary: 138 mg/dL — ABNORMAL HIGH (ref 70–99)
Glucose-Capillary: 116 mg/dL — ABNORMAL HIGH (ref 70–99)
Glucose-Capillary: 143 mg/dL — ABNORMAL HIGH (ref 70–99)
Glucose-Capillary: 140 mg/dL — ABNORMAL HIGH (ref 70–99)
Glucose-Capillary: 178 mg/dL — ABNORMAL HIGH (ref 70–99)

## 2021-01-25 LAB — RENAL FUNCTION PANEL
Albumin: 2.8 g/dL — ABNORMAL LOW (ref 3.5–5.0)
Anion gap: 21 — ABNORMAL HIGH (ref 5–15)
BUN: 50 mg/dL — ABNORMAL HIGH (ref 8–23)
CO2: 23 mmol/L (ref 22–32)
Calcium: 9 mg/dL (ref 8.9–10.3)
Chloride: 91 mmol/L — ABNORMAL LOW (ref 98–111)
Creatinine, Ser: 3.05 mg/dL — ABNORMAL HIGH (ref 0.61–1.24)
GFR, Estimated: 22 mL/min — ABNORMAL LOW (ref >60)
Glucose, Bld: 147 mg/dL — ABNORMAL HIGH (ref 70–99)
Phosphorus: 6.9 mg/dL — ABNORMAL HIGH (ref 2.5–4.6)
Potassium: 4 mmol/L (ref 3.5–5.1)
Sodium: 135 mmol/L (ref 135–145)

## 2021-01-25 MED ORDER — ACETAMINOPHEN 10 MG/ML IV SOLN
1000.0000 mg | Freq: Four times a day (QID) | INTRAVENOUS | Status: AC | PRN
Start: 2021-01-25 — End: 2021-01-26
  Administered 2021-01-25 – 2021-01-26 (×2): 1000 mg via INTRAVENOUS
  Filled 2021-01-25: qty 100

## 2021-01-25 MED ORDER — LEVETIRACETAM IN NACL 500 MG/100ML IV SOLN
500.0000 mg | Freq: Two times a day (BID) | INTRAVENOUS | Status: DC
  Administered 2021-01-25: 500 mg via INTRAVENOUS
  Filled 2021-01-25 (×2): qty 100

## 2021-01-25 MED ORDER — MORPHINE SULFATE (PF) 2 MG/ML IV SOLN
1.0000 mg | INTRAVENOUS | Status: DC | PRN
  Administered 2021-01-25 (×2): 1 mg via INTRAVENOUS
  Filled 2021-01-25 (×2): qty 1

## 2021-01-25 MED ORDER — ACETAMINOPHEN 160 MG/5ML PO SOLN: 650.0000 mg | Freq: Three times a day (TID) | ORAL | Status: DC

## 2021-01-25 MED ORDER — HYDRALAZINE HCL 20 MG/ML IJ SOLN: 10.0000 mg | INTRAMUSCULAR | Status: DC | PRN

## 2021-01-25 NOTE — Progress Notes (Signed)
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 CRRT initiated 7/31 and transitioned to IHD on 12/19/20.  S/p [facility] placement 12/22/20.  Continue with TTS schedule for now. -working with PT in regards to sitting in chair/recliner -possibly heading towards renal recovery? Monitor urine output closely   #Urinary retention -foley placed 9/16 -strict I/O  # Volume overload/anasarca-improved with dialysis.  # Sepsis/aspiration pneumonia/VDRF:aspiration pneumonia.  Completed antibiotics course.  S/p tracheostomy, however pt removed it on 01/01/21 s/p intubation.  New tracheostomy created 01/05/21 by ENT.   # Stage IIIB colon cancer - sp partial colectomy, colostomy. On chemoRx sp 3 cycles.  # Right leg DVT: On Eliquis.  # Atrial fibrillation - on amiodarone and anticoagulation.    # Anemia of critical illness: DC'ed  IV iron as concern for infection and on abx, transfuse as needed, holding erythropoietin with active malignancy.  Monitor hemoglobin and transfuse as needed.  #CKD-MBD; started sevelamer for hyperphosphatemia on 9/8, monitor phosphorus level.--improved  #Disposition - poor overall prognosis.  Seen by palliative care team.  Discussion ongoing for possible transfer to LTAC/[facility].  The tracheostomy should be capped before he can be accepted to [facility]  Subjective: Seen and examined bedside this AM.  Had issues with anxiety, tachypneic, tachycardic with dialysis yesterday despite klonopin.  Was rinsed back 25 minutes early. Feels better today, uop  Objective Vital signs in last 24 hours: Vitals:   01/25/21 0400 01/25/21 0500 01/25/21 0600 01/25/21 0754  BP:    124/64  Pulse: 100 97 98 95  Resp: _0 Temp:    97.8 F (36.6 C)  TempSrc:    Axillary  SpO2: 90% 97% 94% 93%  Weight:      Height:       Weight change:   Intake/Output  Summary (Last 24 hours) at 01/25/2021 0815 Last data filed at 01/25/2021 0200 Gross per 24 hour  Intake --  Output 1559 ml  Net -1559 ml       Labs: Basic Metabolic Panel: Recent Labs  Lab 01/23/21 0453 01/24/21 0122 01/25/21 0155  NA 133* 132* 135  K 4.6 4.2 4.0  CL 90* 89* 91*  CO2 23 18* 23  GLUCOSE 188* 154* 147*  BUN 68* 89* 50*  CREATININE 3.66* 3.87* 3.05*  CALCIUM 9.8 10.0 9.0  PHOS 9.4* 10.8* 6.9*   Liver Function Tests: Recent Labs  Lab 01/19/21 0230 01/20/21 0516 01/23/21 0453 01/24/21 0122 01/25/21 0155  AST 14*  --   --   --   --   ALT 15  --   --   --   --   ALKPHOS 109  --   --   --   --   BILITOT 0.7  --   --   --   --   PROT 8.1  --   --   --   --   ALBUMIN 2.8*  2.8*   < > 2.8* 2.7* 2.8*   < > = values in this interval not displayed.   No results for input(s): LIPASE, AMYLASE in the last 168 hours. No results for input(s): AMMONIA in the last 168 hours. CBC: Recent Labs  Lab 01/19/21 0230 01/20/21 0516 01/22/21 1003 01/23/21 0453 01/24/21 0122  WBC 17.8* 15.4* 15.6* 15.7* 15.5*  NEUTROABS 12.9*  --   --   --   --  HGB 9.7* 9.4* 9.3* 8.7* 9.3*  HCT 29.2* 28.1* 27.5* 26.6* 26.9*  MCV 87.2 85.9 86.5 86.9 84.6  PLT 278 339 260 244 241   Cardiac Enzymes: No results for input(s): CKTOTAL, CKMB, CKMBINDEX, TROPONINI in the last 168 hours. CBG: Recent Labs  Lab 01/24/21 0747 01/24/21 1625 01/24/21 2219 01/24/21 2350 01/25/21 0330  GLUCAP 155* 163* 178* 162* 124*    Iron Studies: No results for input(s): IRON, TIBC, TRANSFERRIN, FERRITIN in the last 72 hours. Studies/Results: No results found.  Medications: Infusions:  sodium chloride Stopped (12/20/20 0955)   anticoagulant sodium citrate     cefTAZidime (FORTAZ)  IV Stopped (01/24/21 1147)   feeding supplement (NEPRO CARB STEADY) 1,000 mL (01/24/21 1516)    Scheduled Medications:  acetaminophen  1,000 mg Per Tube Q8H   amLODipine  5 mg Per Tube Daily   apixaban  5 mg  Per Tube BID   vitamin C  500 mg Per Tube TID   atorvastatin  20 mg Per Tube Daily   chlorhexidine gluconate (MEDLINE KIT)  15 mL Mouth Rinse BID   Chlorhexidine Gluconate Cloth  6 each Topical Q0600   Chlorhexidine Gluconate Cloth  6 each Topical Q0600   clonazepam  0.5 mg Per Tube BID   feeding supplement (PROSource TF)  45 mL Per Tube BID   fiber  1 packet Per Tube BID   gabapentin  100 mg Per Tube Q8H   hydrocerin   Topical BID   insulin aspart  0-15 Units Subcutaneous Q4H   insulin detemir  15 Units Subcutaneous BID   levETIRAcetam  500 mg Per Tube BID   loperamide HCl  1 mg Per Tube QID   mouth rinse  15 mL Mouth Rinse 10 times per day   multivitamin  1 tablet Per Tube QHS   nystatin  5 mL Per Tube QID   QUEtiapine  50 mg Per Tube BID   sevelamer carbonate  2.4 g Per Tube TID WC   thiamine  100 mg Per Tube Daily    have reviewed scheduled and prn medications.  Physical Exam: General: Ill-looking male, with tracheostomy on trach collar, not in distress Heart:RRR, s1s2 nl Lungs: normal wob Abdomen:soft, Non-tender, non-distended Extremities: No edema. Neurology: Alert awake and following commands. Dialysis Access: [facility] in place.   Calyn Sivils 01/25/2021,8:15 AM  LOS: 55 days

## 2021-01-25 NOTE — Progress Notes (Signed)
MD on call was contacted since pt's cortrak is clogged and pt is NPO. Some meds were changed to IV, the rest of meds will be held this shift. Pt c/o pain to his abd, Tylenol IV drip was ordered. BS scheduled for Q4hr check.

## 2021-01-25 NOTE — Progress Notes (Signed)
Cortrak unclogged, meds and feeding were resumed

## 2021-01-25 NOTE — Progress Notes (Addendum)
PROGRESS NOTE    Thomas [name]  EXN:[id number] DOB: Feb 20, 1956 DOA: 11/30/2020 PCP: [name], NP    Brief Narrative:   Thomas [name] is a 65 y.o. male with a history of colon cancer s/p resection and colostomy May 2022 s/p 3 cycles chemotherapy (last was July 2022), type 2 diabetes mellitus with neuropathy, hypertension, hyperlipidemia presented to the ED on 11/30/2020 with complaints of abdominal pain.  Patient was found to be febrile, tachycardic, tachypneic with elevated lactate and was noted to have sepsis.  Patient was then admitted hospital for acute kidney injury, demand ischemia, hyperglycemia with lactic acidosis.  Patient developed atrial fibrillation with RVR and was started on Cardizem drip.  Was initially admitted to the ICU for septic shock.  His presentation was also thought to be likely secondary to cepacitabine toxicity with oral mucositis, hand/foot syndrome, and diarrhea/high output colostomy.  Patient was transferred to hospitalist service on 7/27 but developed metabolic encephalopathy with hyoxia due to aspiration pneumonia on 12/06/20 requiring intubation and pressor support.  Patient failed extubation with inability to clear secretions, requiring reintubation 12/15/20, and tracheostomy subsequently placed 12/18/20. This was pulled out 8/22, replaced under bronch guidance, again removed 8/25 requiring reintubation after failed reinsertion. ENT revised tracheostomy in the OR on 8/29.  Also required intermittent hemodialysis for acute kidney injury.  Patient also had RLE DVT now on eliquis, colitis thought to be ischemic 12/09/20 not requiring specific intervention.  He also had Pseudomonas pneumonia having completed treatment, metabolic encephalopathy which has improved, anemia requiring 4u PRBCs total, and thrombocytopenia which has resolved. On 9/4, he was transferred to hospitalist service having tolerated trach collar.  Assessment & Plan:   Principal Problem:   Diabetes mellitus  with hyperosmolarity without hyperglycemic hyperosmolar nonketotic coma (HCC) Active Problems:   Lactic acidosis   Acute renal failure superimposed on stage 2 chronic kidney disease (HCC)   Metabolic acidosis   Tachycardia   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 (HCC)   Adverse effect of chemotherapy   Pressure injury of skin   Atrial fibrillation with RVR (HCC)   Sepsis (HCC)   Malnutrition of moderate degree   Acute metabolic encephalopathy   Acute respiratory failure with hypoxemia (HCC)   Acute renal failure with acute cortical necrosis (HCC)   Shock ([place])   Status post tracheostomy ([place])   Endotracheal tube present   Hemodialysis status ([place])   Palliative care by specialist   SOB (shortness of breath)  Acute hypoxic respiratory failure s/p tracheostomy due to aspiration pneumonia with Recurrent pansensitive Pseudomonas tracheobronchitis Continue speech therapy, Complete Fortaz after 10+ day course.  ENT saw the patient during hospitalization changed to cuffless trach on 01/16/2021. Today, he is breathing much more comfortably. The dressing around trach is dry and do not appreciate any gross sounds of mucus/secretions in trachea. - continue robinol for secretions - PT/OT - trach maintenance - dc fortaz (9/8-9/18) after course of CTX and fortaz was already completed. Lurline Idol had been replaced after initial antibiotic initiation.   Acute renal failure on stage IIIb CKD with oliguria: dialysis T,Th,S. Nephrology following with hope of renal recovery still. - oxy IR PRN - consider fentanyl patch day prior to next dialysis session (Tuesday) - continue phos binders with meals (Phos 6.9 today)   High output ostomy, diarrhea:  Negative C. difficile PCR and GI pathogen panel.     Anemia of critical illness, acute blood loss as well as anemia of chronic disease and iron deficiency:  Patient received 4 units of packed RBC during hospitalization.  Has received IV  iron x8 doses.  Hgb improved to 9.3 yesterday - CBC am  - consider aranesp vs repeat iron, per nephrology   Stage IIIb colon CA s/p partial transverse colectomy with end colostomy May 2022:  Patient was followed by Dr. Learta Codding oncology as outpatient.Buddy Duty CAPOX June 2022, reduced cepecitabine dose starting 11/19/2020 due to hand/foot syndrome, reduced oxaliplatin due to renal impairment and poor performance status.  Palliative care followed the patient during hospitalization.    Moderate protein calorie malnutrition/dysphagia Continue tube feeds via cortrak tube.  Continue speech therapy evaluation if able to advance diet.  Nutritionist on board.  Nutrition Status: Nutrition Problem: Moderate Malnutrition Etiology: chronic illness, cancer and cancer related treatments Signs/Symptoms: mild fat depletion, mild muscle depletion, percent weight loss Percent weight loss: 11 % Interventions: Prostat, Tube feeding, MVI  Physical deconditioning/back pain --Secondary to prolonged critical illness and myopathy.  Continue physical therapy.  on Oxy IR to 7.5 mg.    Acute extensive right leg DVT:  Diagnosed on 12/12/2020.  Involving CFV, SF junction, proximal femoral and profunda veins and popliteal veins. Continue eliquis   New onset atrial fibrillation with RVR:  Continue Eliquis.  Currently on sinus rhythm.   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  Not been used during hospitalization.   Acute metabolic encephalopathy, agitated delirium: Resolved On Klonopin and Seroquel.  Taper as tolerated over time   Uncontrolled T2DM on OHAs prior to admission with associated neuropathy:  HbA1c 8.7% at admission.  Continue Levemir, sliding scale insulin and gabapentin.  Essential HTN: well controlled Continue Norvasc   Hyperlipidemia Continue atorvastatin   Thrombocytopenia:  Transient likely due to critical  illness.  HIT antibody negative on 12/12/2020.  Sepsis: ruled out  DVT prophylaxis:  apixaban (ELIQUIS) tablet 5 mg     Code Status: Full Code  Family Communication:  None  Disposition Plan:   Status is: Inpatient  Remains inpatient appropriate because:Unsafe d/c plan, IV treatments appropriate due to intensity of illness or inability to take PO, and Inpatient level of care appropriate due to severity of illness  Dispo: The patient is from: Home              Anticipated d/c is to: SNF              Patient currently is not medically stable to d/c.   Difficult to place patient yes  Consultants:  Palliative care PCCM General surgery Nephrology Hematology/oncology  Procedures:  2u PRBC 12/07/20, 1u PRBC 8/17, 1u PRBC 8/27 Tracheostomy revision 01/05/2021 Dr. Constance Holster HD 9/17 latest  Antimicrobials:  Vancomycin 7/24 - 7/25, 8/2 - 8/2 Cefepime 7/24 - 7/27 Unasyn 7/30 >> cefepime/flagyl 7/31 - 8/6  Fortaz on TTS w/ HD 9/8>>   Subjective:  Patient was resting comfortably. No respiratory distress. Aroused easily to voice.  Objective: Vitals:   01/25/21 0400 01/25/21 0500 01/25/21 0600 01/25/21 0754  BP:    124/64  Pulse: 100 97 98 95  Resp: 16 15 15 16   Temp:    97.8 F (36.6 C)  TempSrc:    Axillary  SpO2: 90% 97% 94% 93%  Weight:      Height:        Intake/Output Summary (Last 24 hours) at 01/25/2021 0823 Last data filed at 01/25/2021 0200 Gross per 24 hour  Intake --  Output  1559 ml  Net -1559 ml    Filed Weights   01/23/21 0437 01/24/21 0850 01/24/21 1150  Weight: 103.3 kg 104.3 kg 104.4 kg   Physical examination: General:  obese, in acute distress with stable vital signs.  HENT:   Tracheostomy tube in place, cortrak tube in place Chest: .  Diminished breath sounds bilaterally. No crackles or wheezes.  CVS: S1 &S2 heard. No murmur.  Regular rate and rhythm. Abdomen: Soft, nontender, nondistended.  Extremities: No cyanosis, clubbing or edema.  Peripheral  pulses are palpable. 1+ edema LEs Psych: Asleep, comfortable CNS:  asleep Skin: Warm and dry.  No rashes noted.  Data Reviewed: I personally reviewed the following labs and imaging studies.   CBC: Recent Labs  Lab 2021-02-16 0230 01/20/21 0516 01/22/21 1003 01/23/21 0453 01/24/21 0122  WBC 17.8* 15.4* 15.6* 15.7* 15.5*  NEUTROABS 12.9*  --   --   --   --   HGB 9.7* 9.4* 9.3* 8.7* 9.3*  HCT 29.2* 28.1* 27.5* 26.6* 26.9*  MCV 87.2 85.9 86.5 86.9 84.6  PLT 278 339 260 244 951    Basic Metabolic Panel: Recent Labs  Lab 02-16-2021 0230 01/20/21 0516 01/21/21 0239 01/22/21 0203 01/23/21 0453 01/24/21 0122 01/25/21 0155  NA 132*  134*   < > 134* 133* 133* 132* 135  K 5.3*  5.3*   < > 4.5 4.8 4.6 4.2 4.0  CL 91*  93*   < > 89* 87* 90* 89* 91*  CO2 22  24   < > 25 23 23  18* 23  GLUCOSE 192*  189*   < > 187* 190* 188* 154* 147*  BUN 67*  65*   < > 58* 80* 68* 89* 50*  CREATININE 3.74*  3.71*   < > 3.20* 3.96* 3.66* 3.87* 3.05*  CALCIUM 9.6  9.7   < > 9.3 9.8 9.8 10.0 9.0  MG 2.1  --   --   --   --  2.4  --   PHOS 8.2*  8.5*   < > 7.9* 10.9* 9.4* 10.8* 6.9*   < > = values in this interval not displayed.    GFR: Estimated Creatinine Clearance: 28.7 mL/min (A) (by C-G formula based on SCr of 3.05 mg/dL (H)). Liver Function Tests: Recent Labs  Lab 2021-02-16 0230 01/20/21 0516 01/21/21 0239 01/22/21 0203 01/23/21 0453 01/24/21 0122 01/25/21 0155  AST 14*  --   --   --   --   --   --   ALT 15  --   --   --   --   --   --   ALKPHOS 109  --   --   --   --   --   --   BILITOT 0.7  --   --   --   --   --   --   PROT 8.1  --   --   --   --   --   --   ALBUMIN 2.8*  2.8*   < > 2.9* 2.9* 2.8* 2.7* 2.8*   < > = values in this interval not displayed.    CBG: Recent Labs  Lab 01/24/21 0747 01/24/21 1625 01/24/21 2219 01/24/21 2350 01/25/21 0330  GLUCAP 155* 163* 178* 162* 124*    Radiology Studies: No results found.  Scheduled Meds:  acetaminophen  1,000  mg Per Tube Q8H   amLODipine  5 mg Per Tube Daily   apixaban  5 mg  Per Tube BID   vitamin C  500 mg Per Tube TID   atorvastatin  20 mg Per Tube Daily   chlorhexidine gluconate (MEDLINE KIT)  15 mL Mouth Rinse BID   Chlorhexidine Gluconate Cloth  6 each Topical Q0600   Chlorhexidine Gluconate Cloth  6 each Topical Q0600   clonazepam  0.5 mg Per Tube BID   feeding supplement (PROSource TF)  45 mL Per Tube BID   fiber  1 packet Per Tube BID   gabapentin  100 mg Per Tube Q8H   hydrocerin   Topical BID   insulin aspart  0-15 Units Subcutaneous Q4H   insulin detemir  15 Units Subcutaneous BID   levETIRAcetam  500 mg Per Tube BID   loperamide HCl  1 mg Per Tube QID   mouth rinse  15 mL Mouth Rinse 10 times per day   multivitamin  1 tablet Per Tube QHS   nystatin  5 mL Per Tube QID   QUEtiapine  50 mg Per Tube BID   sevelamer carbonate  2.4 g Per Tube TID WC   thiamine  100 mg Per Tube Daily   Continuous Infusions:  sodium chloride Stopped (12/20/20 0955)   anticoagulant sodium citrate     cefTAZidime (FORTAZ)  IV Stopped (01/24/21 1147)   feeding supplement (NEPRO CARB STEADY) 1,000 mL (01/24/21 1516)     LOS: 69 days   Richarda Osmond, MD [facility] Available via Epic secure chat 7am-7pm After these hours, please refer to coverage provider listed on [email or web address] 01/25/2021, 8:23 AM

## 2021-01-25 NOTE — Significant Event (Signed)
Since patient's feeding tube has been clogged patient's Keppra was dose was changed to IV for now.  Patient is also placed on as needed IV hydralazine for systolic blood pressure more than 160 and CBGs have been already every 4.  We will closely monitor.  Gean Birchwood

## 2021-01-26 ENCOUNTER — Inpatient Hospital Stay (HOSPITAL_COMMUNITY): Payer: Medicare HMO

## 2021-01-26 DIAGNOSIS — L929 Granulomatous disorder of the skin and subcutaneous tissue, unspecified: Secondary | ICD-10-CM

## 2021-01-26 DIAGNOSIS — E871 Hypo-osmolality and hyponatremia: Secondary | ICD-10-CM

## 2021-01-26 DIAGNOSIS — N1832 Chronic kidney disease, stage 3b: Secondary | ICD-10-CM

## 2021-01-26 DIAGNOSIS — E1100 Type 2 diabetes mellitus with hyperosmolarity without nonketotic hyperglycemic-hyperosmolar coma (NKHHC): Secondary | ICD-10-CM | POA: Diagnosis not present

## 2021-01-26 DIAGNOSIS — C19 Malignant neoplasm of rectosigmoid junction: Secondary | ICD-10-CM

## 2021-01-26 DIAGNOSIS — G4733 Obstructive sleep apnea (adult) (pediatric): Secondary | ICD-10-CM | POA: Diagnosis not present

## 2021-01-26 DIAGNOSIS — I82409 Acute embolism and thrombosis of unspecified deep veins of unspecified lower extremity: Secondary | ICD-10-CM

## 2021-01-26 DIAGNOSIS — J9601 Acute respiratory failure with hypoxia: Secondary | ICD-10-CM | POA: Diagnosis not present

## 2021-01-26 DIAGNOSIS — N179 Acute kidney failure, unspecified: Secondary | ICD-10-CM | POA: Diagnosis not present

## 2021-01-26 DIAGNOSIS — T451X5A Adverse effect of antineoplastic and immunosuppressive drugs, initial encounter: Secondary | ICD-10-CM | POA: Diagnosis not present

## 2021-01-26 LAB — RENAL FUNCTION PANEL
Albumin: 2.8 g/dL — ABNORMAL LOW (ref 3.5–5.0)
Anion gap: 21 — ABNORMAL HIGH (ref 5–15)
BUN: 77 mg/dL — ABNORMAL HIGH (ref 8–23)
CO2: 22 mmol/L (ref 22–32)
Calcium: 9.3 mg/dL (ref 8.9–10.3)
Chloride: 89 mmol/L — ABNORMAL LOW (ref 98–111)
Creatinine, Ser: 4.04 mg/dL — ABNORMAL HIGH (ref 0.61–1.24)
GFR, Estimated: 16 mL/min — ABNORMAL LOW (ref >60)
Glucose, Bld: 219 mg/dL — ABNORMAL HIGH (ref 70–99)
Phosphorus: 8.9 mg/dL — ABNORMAL HIGH (ref 2.5–4.6)
Potassium: 3.6 mmol/L (ref 3.5–5.1)
Sodium: 132 mmol/L — ABNORMAL LOW (ref 135–145)

## 2021-01-26 LAB — CBC WITH DIFFERENTIAL/PLATELET
Abs Immature Granulocytes: 0.2 10*3/uL — ABNORMAL HIGH (ref 0.00–0.07)
Basophils Absolute: 0.2 10*3/uL — ABNORMAL HIGH (ref 0.0–0.1)
Basophils Relative: 1 %
Eosinophils Absolute: 0.1 10*3/uL (ref 0.0–0.5)
Eosinophils Relative: 0 %
HCT: 26.8 % — ABNORMAL LOW (ref 39.0–52.0)
Hemoglobin: 9.1 g/dL — ABNORMAL LOW (ref 13.0–17.0)
Immature Granulocytes: 1 %
Lymphocytes Relative: 9 %
Lymphs Abs: 1.6 10*3/uL (ref 0.7–4.0)
MCH: 28.8 pg (ref 26.0–34.0)
MCHC: 34 g/dL (ref 30.0–36.0)
MCV: 84.8 fL (ref 80.0–100.0)
Monocytes Absolute: 1.5 10*3/uL — ABNORMAL HIGH (ref 0.1–1.0)
Monocytes Relative: 8 %
Neutro Abs: 14.9 10*3/uL — ABNORMAL HIGH (ref 1.7–7.7)
Neutrophils Relative %: 81 %
Platelets: 319 10*3/uL (ref 150–400)
RBC: 3.16 MIL/uL — ABNORMAL LOW (ref 4.22–5.81)
RDW: 14.8 % (ref 11.5–15.5)
WBC: 18.5 10*3/uL — ABNORMAL HIGH (ref 4.0–10.5)
nRBC: 0 % (ref 0.0–0.2)

## 2021-01-26 LAB — URINALYSIS, ROUTINE W REFLEX MICROSCOPIC
Bilirubin Urine: NEGATIVE
Glucose, UA: NEGATIVE mg/dL
Ketones, ur: NEGATIVE mg/dL
Nitrite: NEGATIVE
Protein, ur: 100 mg/dL — AB
Specific Gravity, Urine: >1.03 — ABNORMAL HIGH (ref 1.005–1.030)
pH: 5 (ref 5.0–8.0)

## 2021-01-26 LAB — GLUCOSE, CAPILLARY
Glucose-Capillary: 193 mg/dL — ABNORMAL HIGH (ref 70–99)
Glucose-Capillary: 188 mg/dL — ABNORMAL HIGH (ref 70–99)
Glucose-Capillary: 199 mg/dL — ABNORMAL HIGH (ref 70–99)
Glucose-Capillary: 203 mg/dL — ABNORMAL HIGH (ref 70–99)
Glucose-Capillary: 150 mg/dL — ABNORMAL HIGH (ref 70–99)
Glucose-Capillary: 137 mg/dL — ABNORMAL HIGH (ref 70–99)
Glucose-Capillary: 180 mg/dL — ABNORMAL HIGH (ref 70–99)

## 2021-01-26 LAB — URINALYSIS, MICROSCOPIC (REFLEX): WBC, UA: >50 WBC/hpf (ref 0–5)

## 2021-01-26 LAB — C-REACTIVE PROTEIN: CRP: 7.6 mg/dL — ABNORMAL HIGH (ref <1.0)

## 2021-01-26 LAB — LACTIC ACID, PLASMA: Lactic Acid, Venous: 1.9 mmol/L (ref 0.5–1.9)

## 2021-01-26 MED ORDER — IOHEXOL 9 MG/ML PO SOLN
500.0000 mL | ORAL | Status: AC
Start: 2021-01-26 — End: 2021-01-26
  Administered 2021-01-26 (×2): 500 mL via ORAL

## 2021-01-26 MED ORDER — CHLORHEXIDINE GLUCONATE CLOTH 2 % EX PADS
6.0000 | MEDICATED_PAD | Freq: Every day | CUTANEOUS | Status: DC
  Administered 2021-01-26 – 2021-01-30 (×5): 6 via TOPICAL

## 2021-01-26 MED ORDER — PANTOPRAZOLE 2 MG/ML SUSPENSION
40.0000 mg | Freq: Every day | ORAL | Status: DC
  Administered 2021-01-26 – 2021-01-28 (×4): 40 mg
  Filled 2021-01-26 (×3): qty 20

## 2021-01-26 MED ORDER — LEVETIRACETAM 100 MG/ML PO SOLN
500.0000 mg | Freq: Two times a day (BID) | ORAL | Status: DC
  Administered 2021-01-26 – 2021-01-28 (×6): 500 mg via ORAL
  Filled 2021-01-26 (×8): qty 5

## 2021-01-26 MED ORDER — SCOPOLAMINE 1 MG/3DAYS TD PT72
1.0000 | MEDICATED_PATCH | TRANSDERMAL | Status: DC
  Administered 2021-01-26 – 2021-02-10 (×6): 1.5 mg via TRANSDERMAL
  Filled 2021-01-26 (×6): qty 1

## 2021-01-26 MED ORDER — SODIUM CHLORIDE 0.9 % IV SOLN
3.0000 g | Freq: Two times a day (BID) | INTRAVENOUS | Status: DC
  Administered 2021-01-26 – 2021-01-29 (×6): 3 g via INTRAVENOUS
  Filled 2021-01-26 (×7): qty 8

## 2021-01-26 MED ORDER — HYDROMORPHONE HCL 1 MG/ML IJ SOLN
0.5000 mg | INTRAMUSCULAR | Status: DC | PRN
  Administered 2021-01-26 – 2021-01-29 (×10): 0.5 mg via INTRAVENOUS
  Filled 2021-01-26 (×3): qty 1
  Filled 2021-01-26: qty 0.5
  Filled 2021-01-26 (×7): qty 1

## 2021-01-26 NOTE — Progress Notes (Signed)
[facility] ROUNDING NOTE   Subjective:   Interval History: 65 year old gentleman with a history of colon cancer status postresection and colostomy May 2022.  Status post 3 cycles of chemotherapy last was in July 2022.  Type 2 diabetes with neuropathy hypertension hyperlipidemia presented to the emergency room 11/30/2020 with abdominal pain.  Was found to be secondary to sepsis.  Patient also was found to have acute kidney injury.  Developed atrial fibrillation with rapid ventricular rate and was admitted to intensive care unit for septic shock.  On 12/03/2020 he developed metabolic encephalopathy with hypoxia and aspiration pneumonia and required intubation 12/06/2020.  He also required pressor support.  He was reintubated since 12/15/2020 tracheostomy placed.  His hospital course was complicated by DVT.  It appears that he has recurrent Pseudomonas pneumonia.  He also has recurrent anemia requiring transfusion of 4 units of packed red blood cells.  Blood pressure 140/85 pulse 100 temperature 98 O2 sats 100% FiO2 20% tracheostomy  Sodium 135 potassium 4 chloride 91 CO2 23 BUN 50 creatinine 3 glucose 147 calcium 9 phosphorus 6.9 albumin 2.8.  Urine output 400 cc 01/25/2021.  Dialysis 01/24/2021 with 1 L removed  Objective:  Vital signs in last 24 hours:  Temp:  [97.5 F (36.4 C)-98.9 F (37.2 C)] 98 F (36.7 C) (09/19 0328) Pulse Rate:  [92-108] 108 (09/19 0328) Resp:  [16-22] 20 (09/19 0328) BP: (107-153)/(59-77) 153/75 (09/19 0328) SpO2:  [93 %-100 %] 100 % (09/19 0328) FiO2 (%):  [21 %] 21 % (09/19 0328)  Weight change:  Filed Weights   01/23/21 0437 01/24/21 0850 01/24/21 1150  Weight: 103.3 kg 104.3 kg 104.4 kg    Intake/Output: I/O last 3 completed shifts: In: -  Out: 1559 [Urine:400; Other:909; Stool:250]   Intake/Output this shift:  Total I/O In: 187.2 [IV Piggyback:187.2] Out: 300 [Stool:300]  General: Ill-looking male, with tracheostomy on trach collar, not  in distress Heart:RRR, s1s2 nl Lungs: normal wob Abdomen:soft, Non-tender, non-distended Extremities: No edema. Neurology: Alert awake and following commands. Dialysis Access: [facility] in place.    Basic Metabolic Panel: Recent Labs  Lab 01/21/21 0239 01/22/21 0203 01/23/21 0453 01/24/21 0122 01/25/21 0155  NA 134* 133* 133* 132* 135  K 4.5 4.8 4.6 4.2 4.0  CL 89* 87* 90* 89* 91*  CO2 25 23 23  18* 23  GLUCOSE 187* 190* 188* 154* 147*  BUN 58* 80* 68* 89* 50*  CREATININE 3.20* 3.96* 3.66* 3.87* 3.05*  CALCIUM 9.3 9.8 9.8 10.0 9.0  MG  --   --   --  2.4  --   PHOS 7.9* 10.9* 9.4* 10.8* 6.9*    Liver Function Tests: Recent Labs  Lab 01/21/21 0239 01/22/21 0203 01/23/21 0453 01/24/21 0122 01/25/21 0155  ALBUMIN 2.9* 2.9* 2.8* 2.7* 2.8*   No results for input(s): LIPASE, AMYLASE in the last 168 hours. No results for input(s): AMMONIA in the last 168 hours.  CBC: Recent Labs  Lab 01/20/21 0516 01/22/21 1003 01/23/21 0453 01/24/21 0122  WBC 15.4* 15.6* 15.7* 15.5*  HGB 9.4* 9.3* 8.7* 9.3*  HCT 28.1* 27.5* 26.6* 26.9*  MCV 85.9 86.5 86.9 84.6  PLT 339 260 244 241    Cardiac Enzymes: No results for input(s): CKTOTAL, CKMB, CKMBINDEX, TROPONINI in the last 168 hours.  BNP: Invalid input(s): POCBNP  CBG: Recent Labs  Lab 01/25/21 0753 01/25/21 1200 01/25/21 1558 01/25/21 2123 01/25/21 2326  GLUCAP 138* 116* 143* 140* 178*    Microbiology: Results for orders placed  or performed during the hospital encounter of 11/30/20  Resp Panel by RT-PCR (Flu A&B, Covid) Nasopharyngeal Swab     Status: None   Collection Time: 11/30/20 10:05 PM   Specimen: Nasopharyngeal Swab; Nasopharyngeal(NP) swabs in vial transport medium  Result Value Ref Range Status   SARS Coronavirus 2 by RT PCR NEGATIVE NEGATIVE Final    Comment: (NOTE) SARS-CoV-2 target nucleic acids are NOT DETECTED.  The SARS-CoV-2 RNA is generally detectable in upper respiratory specimens during  the acute phase of infection. The lowest concentration of SARS-CoV-2 viral copies this assay can detect is 138 copies/mL. A negative result does not preclude SARS-Cov-2 infection and should not be used as the sole basis for treatment or other patient management decisions. A negative result may occur with  improper specimen collection/handling, submission of specimen other than nasopharyngeal swab, presence of viral mutation(s) within the areas targeted by this assay, and inadequate number of viral copies(<138 copies/mL). A negative result must be combined with clinical observations, patient history, and epidemiological information. The expected result is Negative.  Fact Sheet for Patients:  EntrepreneurPulse.com.au  Fact Sheet for Healthcare Providers:  IncredibleEmployment.be  This test is no t yet approved or cleared by the [place] FDA and  has been authorized for detection and/or diagnosis of SARS-CoV-2 by FDA under an Emergency Use Authorization (EUA). This EUA will remain  in effect (meaning this test can be used) for the duration of the COVID-19 declaration under Section 564(b)(1) of the Act, 21 U.S.C.section 360bbb-3(b)(1), unless the authorization is terminated  or revoked sooner.       Influenza A by PCR NEGATIVE NEGATIVE Final   Influenza B by PCR NEGATIVE NEGATIVE Final    Comment: (NOTE) The Xpert Xpress SARS-CoV-2/FLU/RSV plus assay is intended as an aid in the diagnosis of influenza from Nasopharyngeal swab specimens and should not be used as a sole basis for treatment. Nasal washings and aspirates are unacceptable for Xpert Xpress SARS-CoV-2/FLU/RSV testing.  Fact Sheet for Patients: EntrepreneurPulse.com.au  Fact Sheet for Healthcare Providers: IncredibleEmployment.be  This test is not yet approved or cleared by the [place] FDA and has been authorized for detection and/or  diagnosis of SARS-CoV-2 by FDA under an Emergency Use Authorization (EUA). This EUA will remain in effect (meaning this test can be used) for the duration of the COVID-19 declaration under Section 564(b)(1) of the Act, 21 U.S.C. section 360bbb-3(b)(1), unless the authorization is terminated or revoked.  Performed at [facility], [place] [address] [place], [place] [address]   Blood Culture (routine x 2)     Status: None   Collection Time: 11/30/20 10:05 PM   Specimen: BLOOD  Result Value Ref Range Status   Specimen Description   Final    BLOOD LEFT ANTECUBITAL Performed at [place] [address], [place], [place] [address]    Special Requests   Final    BOTTLES DRAWN AEROBIC AND ANAEROBIC Blood Culture results may not be optimal due to an inadequate volume of blood received in culture bottles Performed at [place] [address], [place], [place] [address]    Culture   Final    NO GROWTH 5 DAYS Performed at [facility], [place] [address] [place],  [address]    Report Status 12/06/2020 FINAL  Final  Blood Culture (routine x 2)     Status: None   Collection Time: 11/30/20 10:33 PM   Specimen: BLOOD  Result Value Ref Range  Status   Specimen Description   Final    BLOOD RIGHT ANTECUBITAL Performed at [place] [address] [place], [place] [address]    Special Requests   Final    BOTTLES DRAWN AEROBIC AND ANAEROBIC Blood Culture adequate volume Performed at [place] [address], [place], [place] [address]    Culture   Final    NO GROWTH 5 DAYS Performed at [facility], [place] [address], [place], [place] [address]    Report Status 12/06/2020 FINAL  Final  Urine Culture     Status: None   Collection Time: 12/01/20  2:00 AM   Specimen: In/Out Cath Urine  Result Value Ref Range Status   Specimen Description   Final    IN/OUT CATH  URINE Performed at [place] [address] [place], [place] [address]    Special Requests   Final    NONE Performed at [facility], [place] [address], [place], [place] [address]    Culture   Final    NO GROWTH Performed at [facility], [place] [address] [place], [place] [address]    Report Status 12/02/2020 FINAL  Final  MRSA Next Gen by PCR, Nasal     Status: None   Collection Time: 12/01/20  8:28 AM   Specimen: Nasal Mucosa; Nasal Swab  Result Value Ref Range Status   MRSA by PCR Next Gen NOT DETECTED NOT DETECTED Final    Comment: (NOTE) The GeneXpert MRSA Assay (FDA approved for NASAL specimens only), is one component of a comprehensive MRSA colonization surveillance program. It is not intended to diagnose MRSA infection nor to guide or monitor treatment for MRSA infections. Test performance is not FDA approved in patients less than 108 years old. Performed at [facility], [place] [address] [place], [place] [address]   C Difficile Quick Screen w PCR reflex     Status: None   Collection Time: 12/02/20  4:33 PM   Specimen: STOOL  Result Value Ref Range Status   C Diff antigen NEGATIVE NEGATIVE Final   C Diff toxin NEGATIVE NEGATIVE Final   C Diff interpretation No C. difficile detected.  Final    Comment: Performed at [facility], [place] [address], [place], [place] [address]  Culture, Respiratory w Gram Stain     Status: None   Collection Time: 12/07/20  8:14 AM   Specimen: Tracheal Aspirate; Respiratory  Result Value Ref Range Status   Specimen Description   Final    TRACHEAL ASPIRATE Performed at [place] [address] [place], [place] [address]    Special Requests   Final    NONE Performed at [facility], [place] [address], [place], [place] [address]    Gram Stain   Final    FEW WBC PRESENT,BOTH PMN AND MONONUCLEAR ABUNDANT YEAST FEW GRAM  POSITIVE RODS Performed at [facility], [place] [address], [place], [place] [address]    Culture MODERATE CANDIDA TROPICALIS  Final   Report Status 12/09/2020 FINAL  Final  Gastrointestinal Panel by PCR , Stool     Status: None   Collection Time: 12/08/20  2:05 PM   Specimen: Stool  Result Value Ref Range Status   Campylobacter species NOT DETECTED NOT DETECTED Final   Plesimonas shigelloides NOT DETECTED NOT DETECTED Final   Salmonella species NOT DETECTED NOT DETECTED Final   Yersinia enterocolitica  NOT DETECTED NOT DETECTED Final   Vibrio species NOT DETECTED NOT DETECTED Final   Vibrio cholerae NOT DETECTED NOT DETECTED Final   Enteroaggregative E coli (EAEC) NOT DETECTED NOT DETECTED Final   Enteropathogenic E coli (EPEC) NOT DETECTED NOT DETECTED Final   Enterotoxigenic E coli (ETEC) NOT DETECTED NOT DETECTED Final   Shiga like toxin producing E coli (STEC) NOT DETECTED NOT DETECTED Final   Shigella/Enteroinvasive E coli (EIEC) NOT DETECTED NOT DETECTED Final   Cryptosporidium NOT DETECTED NOT DETECTED Final   Cyclospora cayetanensis NOT DETECTED NOT DETECTED Final   Entamoeba histolytica NOT DETECTED NOT DETECTED Final   Giardia lamblia NOT DETECTED NOT DETECTED Final   Adenovirus F40/41 NOT DETECTED NOT DETECTED Final   Astrovirus NOT DETECTED NOT DETECTED Final   Norovirus GI/GII NOT DETECTED NOT DETECTED Final   Rotavirus A NOT DETECTED NOT DETECTED Final   Sapovirus (I, II, IV, and V) NOT DETECTED NOT DETECTED Final    Comment: Performed at [facility], [place] [place], [place] [address]  Culture, Respiratory w Gram Stain     Status: None   Collection Time: 12/22/20 10:13 AM   Specimen: Tracheal Aspirate; Respiratory  Result Value Ref Range Status   Specimen Description TRACHEAL ASPIRATE  Final   Special Requests NONE  Final   Gram Stain   Final    FEW SQUAMOUS EPITHELIAL CELLS PRESENT FEW WBC SEEN ABUNDANT GRAM POSITIVE COCCI FEW GRAM  VARIABLE ROD Performed at [facility], [place] [address] [place], [place] [address]    Culture RARE PSEUDOMONAS AERUGINOSA  Final   Report Status 12/26/2020 FINAL  Final   Organism ID, Bacteria PSEUDOMONAS AERUGINOSA  Final      Susceptibility   Pseudomonas aeruginosa - MIC*    CEFTAZIDIME <=1 SENSITIVE Sensitive     GENTAMICIN <=1 SENSITIVE Sensitive     PIP/TAZO 8 SENSITIVE Sensitive     * RARE PSEUDOMONAS AERUGINOSA  MRSA Next Gen by PCR, Nasal     Status: Abnormal   Collection Time: 01/02/21 11:03 AM   Specimen: Nasal Mucosa; Nasal Swab  Result Value Ref Range Status   MRSA by PCR Next Gen (A) NOT DETECTED Final    INVALID, UNABLE TO DETERMINE THE PRESENCE OF TARGET DUE TO SPECIMEN INTEGRITY. RECOLLECTION REQUESTED.    Comment:        The GeneXpert MRSA Assay (FDA approved for NASAL specimens only), is one component of a comprehensive MRSA colonization surveillance program. It is not intended to diagnose MRSA infection nor to guide or monitor treatment for MRSA infections. Performed at [facility], [place] [address], [place], [place] [address]   MRSA Next Gen by PCR, Nasal     Status: None   Collection Time: 01/03/21  3:08 AM   Specimen: Nasal Mucosa; Nasal Swab  Result Value Ref Range Status   MRSA by PCR Next Gen NOT DETECTED NOT DETECTED Final    Comment: (NOTE) The GeneXpert MRSA Assay (FDA approved for NASAL specimens only), is one component of a comprehensive MRSA colonization surveillance program. It is not intended to diagnose MRSA infection nor to guide or monitor treatment for MRSA infections. Test performance is not FDA approved in patients less than 70 years old. Performed at [facility], [place] [address] [place],  [address]   Urine Culture     Status: Abnormal   Collection Time: 01/14/21  8:46 AM   Specimen: In/Out Cath Urine  Result Value Ref  Range Status   Specimen Description IN/OUT CATH URINE  Final   Special Requests   Final     NONE Performed at [facility], [place] [address], [place], [place] [address]    Culture MULTIPLE SPECIES PRESENT, SUGGEST RECOLLECTION (A)  Final   Report Status 01/15/2021 FINAL  Final  Culture, blood (Routine X 2) w Reflex to ID Panel     Status: None   Collection Time: 01/14/21 11:21 AM   Specimen: BLOOD  Result Value Ref Range Status   Specimen Description BLOOD RIGHT ANTECUBITAL  Final   Special Requests   Final    BOTTLES DRAWN AEROBIC ONLY Blood Culture adequate volume   Culture   Final    NO GROWTH 5 DAYS Performed at [facility], [place] [address], [place], [place] [address]    Report Status 01/19/2021 FINAL  Final  Culture, blood (Routine X 2) w Reflex to ID Panel     Status: None   Collection Time: 01/14/21 11:22 AM   Specimen: BLOOD  Result Value Ref Range Status   Specimen Description BLOOD LEFT ANTECUBITAL  Final   Special Requests   Final    BOTTLES DRAWN AEROBIC ONLY Blood Culture adequate volume   Culture   Final    NO GROWTH 5 DAYS Performed at [facility], [place] [address], [place], [place] [address]    Report Status 01/19/2021 FINAL  Final  Culture, Respiratory w Gram Stain     Status: None   Collection Time: 01/14/21 11:40 AM   Specimen: Tracheal Aspirate; Respiratory  Result Value Ref Range Status   Specimen Description TRACHEAL ASPIRATE  Final   Special Requests NONE  Final   Gram Stain   Final    ABUNDANT WBC PRESENT,BOTH PMN AND MONONUCLEAR FEW SQUAMOUS EPITHELIAL CELLS PRESENT MODERATE GRAM NEGATIVE RODS FEW GRAM VARIABLE ROD Performed at [facility], [place] [address], [place], [place] [address]    Culture MODERATE PSEUDOMONAS AERUGINOSA  Final   Report Status 01/18/2021 FINAL  Final   Organism ID, Bacteria PSEUDOMONAS AERUGINOSA  Final      Susceptibility   Pseudomonas aeruginosa - MIC*    CEFTAZIDIME <=1 SENSITIVE Sensitive     CIPROFLOXACIN <=0.25 SENSITIVE Sensitive     GENTAMICIN <=1 SENSITIVE Sensitive      PIP/TAZO <=4 SENSITIVE Sensitive     * MODERATE PSEUDOMONAS AERUGINOSA    Coagulation Studies: No results for input(s): LABPROT, INR in the last 72 hours.  Urinalysis: No results for input(s): COLORURINE, LABSPEC, PHURINE, GLUCOSEU, HGBUR, BILIRUBINUR, KETONESUR, PROTEINUR, UROBILINOGEN, NITRITE, LEUKOCYTESUR in the last 72 hours.  Invalid input(s): APPERANCEUR    Imaging: No results found.   Medications:    sodium chloride Stopped (12/20/20 0955)   anticoagulant sodium citrate     feeding supplement (NEPRO CARB STEADY) 1,000 mL (01/25/21 2246)   levETIRAcetam 500 mg (01/25/21 2215)    amLODipine  5 mg Per Tube Daily   apixaban  5 mg Per Tube BID   vitamin C  500 mg Per Tube TID   atorvastatin  20 mg Per Tube Daily   chlorhexidine gluconate (MEDLINE KIT)  15 mL Mouth Rinse BID   Chlorhexidine Gluconate Cloth  6 each Topical Q0600   Chlorhexidine Gluconate Cloth  6 each Topical Q0600   clonazepam  0.5 mg Per Tube BID   feeding supplement (PROSource TF)  45 mL Per Tube BID   fiber  1 packet Per Tube BID  gabapentin  100 mg Per Tube Q8H   hydrocerin   Topical BID   insulin aspart  0-15 Units Subcutaneous Q4H   insulin detemir  15 Units Subcutaneous BID   loperamide HCl  1 mg Per Tube QID   mouth rinse  15 mL Mouth Rinse 10 times per day   multivitamin  1 tablet Per Tube QHS   nystatin  5 mL Per Tube QID   QUEtiapine  50 mg Per Tube BID   sevelamer carbonate  2.4 g Per Tube TID WC   thiamine  100 mg Per Tube Daily   sodium chloride, albuterol, clonazePAM, dextrose, glycopyrrolate, hydrALAZINE, lip balm, morphine injection, ondansetron (ZOFRAN) IV, oxyCODONE, sodium chloride flush  Assessment/ Plan:  1.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 CRRT initiated 7/31 and transitioned to IHD on 12/19/20.  S/p [facility] placement 12/22/20.  Continue with TTS schedule for now. -working with PT in regards to  sitting in chair/recliner -possibly heading towards renal recovery? Monitor urine output closely    2.Urinary retention foley placed 9/16 -strict I/O  3.Volume overload/anasarca-improved with dialysis.  4.Sepsis/aspiration pneumonia/VDRF:aspiration pneumonia.  Completed antibiotics course.  S/p tracheostomy, however pt removed it on 01/01/21 s/p intubation.  New tracheostomy created 01/05/21 by ENT.   4.Stage IIIB colon cancer - sp partial colectomy, colostomy. On chemoRx sp 3 cycles.   5.Right leg DVT: On Eliquis.  6.Atrial fibrillation - on amiodarone and anticoagulation.    7.Anemia of critical illness: DC'ed  IV iron as concern for infection and on abx, transfuse as needed, holding erythropoietin with active malignancy.  Monitor hemoglobin and transfuse as needed.  8. CKD-MBD; started sevelamer for hyperphosphatemia on 9/8, monitor phosphorus level.--improved   9. Disposition - poor overall prognosis.  Seen by palliative care team.  Discussion ongoing for possible transfer to [facility].  The tracheostomy should be capped before he can be accepted to [facility]    LOS: [place] @TODAY @6 :29 AM

## 2021-01-26 NOTE — Progress Notes (Signed)
NAME:  Thomas [name], MRN:  [id number], DOB:  [birth year], LOS: 26 ADMISSION DATE:  11/30/2020, CONSULTATION DATE:  7/30 REFERRING MD:  Riccardo Dubin, CHIEF COMPLAINT:  Worsening hypoxia/dyspnea  History of Present Illness:  65 yo M admitted 7/24 for worsening sepsis vs reaction to chemotherapy with high ostomy output and soft BP on 7/25. Has had issues during his hospitalization with recurrent aspirations and more recently worsening respiratory status.  Most recently pt developed worsening hypoxia and was requiring BIPAP w/ FIO2 of 100% and increased work of breathing.   He was admitted to intensive care and subsequently had worsening clinical status requiring intubation, CRRT, multiple pressors and pneumonia along with CT findings 8/1 concerning for ischemic bowel.  Significant Hospital Events:  7/27 Transferred to [facility], stopped abx as sepsis resolved/ruled out 7/30 AM Worsened AME, more hypoxic with worsening renal funciton, new aspiration on abx restarted 7/30 PM Pt with increased o2 requirement on 100% FIO2 on BIPAP, d/w family who wanted to proceed with intubation, pt intubated 7/31 Tracheal aspirate grew moderate candida tropicalis  8/1 Some issues with tachycardia and hypotension overnight. Reproted high ostomy output.  GI PCR negative 8/2 down to 9 mcg of Levophed, however repeat CT abdomen/pelvis showed inflammatory wall thickening and fat stranding of the descending and sigmoid colon, new since prior, consistent with nonspecific infectious, inflammatory, or ischemic colitis.  Also new dense consolidative airspace opacity in the right lower and right middle lobes 8/3 further weaning NE. Weaning sedation. Full code, full scope per family meeting 8/2. Eraxis stopped (8/1-8/3) 8/4 off pressors, plt down to 103.  8/5 remains off pressors. Moving around more, not really following commands though  8/9 failed extubation 8/8, tolerated nasal cannula for about an hour, desaturations and inability to  clear secretions-reintubated 8/8 8/10 some agitation overnight 8/11 tracheostomy placement On trach collar since 8/16 8/22 pulled trach out, replaced under bronch guidance. Has proximal area of granulation tissue that is above the bottom of the trach tube.  8/25 Removed Trach, unable to reinsert, despite multiple attempts re-intubated 8/29 Anemia, 1 unit PRBC. ENT re-placed Trach in [place]. 9/8 tracheal aspirate growing pan-sensitive P. Aeruginosa. Started on Ceftazidine  9/19 Continues to have thick secretions.   Interim History / Subjective:   Intermittently tolerating PMSV. Follows commands. Strong cough  Objective   Blood pressure 135/73, pulse 96, temperature 98.2 F (36.8 C), temperature source Axillary, resp. rate 18, height 5\' 8"  (1.727 m), weight 104.4 kg, SpO2 100 %.    FiO2 (%):  [21 %-35 %] 28 %   Intake/Output Summary (Last 24 hours) at 01/26/2021 1611 Last data filed at 01/26/2021 1202 Gross per 24 hour  Intake 2988.02 ml  Output 725 ml  Net 2263.02 ml   Filed Weights   01/23/21 0437 01/24/21 0850 01/24/21 1150  Weight: 103.3 kg 104.3 kg 104.4 kg     Physical Exam: General: Chronically ill-appearing, no acute distress HENT: [place], AT Eyes: EOMI, no scleral icterus Neck: Uncuffed #6 XLT trach in place with thick secretions Respiratory: Scattered rhonchi bilaterally.  Cardiovascular: RRR, -M/R/G, no JVD Extremities:-Edema,-tenderness Neuro: AAO x4, CNII-XII grossly intact, weak x 4, able to reach trach with hands    Resolved Hospital Problem list   Shock  HHS Afib RVR  Lactic Acidosis  Thrombocytopenia Colitis  Assessment & Plan:  Acute hypoxemic respiratory failure s/p tracheostomy Endotracheal granulation tissue-  Trach revision by ENT on 8/29 to cuff-less trach Aspiration pneumonia Obstructive sleep apnea Pseudomonas positive Tracheal Aspirate culture -  Previously treated. Regrowth likely represents colonization. Given lack of fever, nor change in  clinical symptoms, would not recommend treating.   -Intermittently tolerating PMSV. Secretions continue to be an issue to downsizing and capping trials -Continue robinul. Start scopolamine patch -Continue routine trach care -Continue mobility and pulmonary hygiene -Will follow weekly for trach management.   Per primary: Acute kidney injury on chronic kidney disease stage IIIb - on dialysis Hyponatremia Colorectal cancer: Completed chemotherapy July 2022 Lower extremity DVT- Continue Eliquis  I have spent a total time of 35-minutes on the day of the appointment reviewing prior documentation, coordinating care and discussing medical diagnosis and plan with the patient/family. P Pertinent imaging, labs and tests included in this note have been reviewed and interpreted independently by me.  Rodman Pickle, M.D. [facility] Pulmonary/Critical Care Medicine 01/26/2021 4:20 PM   See Amion for personal pager For hours between 7 PM to 7 AM, please call [place] for urgent questions

## 2021-01-26 NOTE — Progress Notes (Addendum)
PT Cancellation Note  Patient Details Name: LATRON RIBAS MRN: [id number] DOB: Jul 20, 1955   Cancelled Treatment:    Reason Eval/Treat Not Completed: Patient declined, no reason specified. Pt shaking his head no as therapist entering room. Pt reports he threw up all over himself last night and is just not up for/able to do anything today. Pt also reporting abdominal pain. Offered bed level exercises but he continued to decline. Pt tearful and mildly trembling. When asked if there was anything he needed, pt stated "prayers." Active listening and comfort provided. Pt agreeable for PT to re-attempt tomorrow.   Lorriane Shire 01/26/2021, 11:36 AM  Lorrin Goodell, PT  Office # [phone] Pager [phone]

## 2021-01-26 NOTE — Progress Notes (Signed)
Pharmacy Antibiotic Note  Thomas [name] is a 65 y.o. male admitted on 11/30/2020 with complex medical history and extended hospital stay for AKI on CKD, sepsis and now concern for abdominal infection versus UTI .  Pharmacy has been consulted for Ampicillin/Sulbactam dosing. Patient is on a TTS dialysis schedule currently.   Plan: Will start Ampicillin/Sulbactam 3gm q12hr  Height: 5\' 8"  (172.7 cm) Weight: 104.4 kg (230 lb 2.6 oz) IBW/kg (Calculated) : 68.4  Temp (24hrs), Avg:98.3 F (36.8 C), Min:97.9 F (36.6 C), Max:98.9 F (37.2 C)  Recent Labs  Lab 01/20/21 0516 01/21/21 0239 01/22/21 0203 01/22/21 1003 01/23/21 0453 01/24/21 0122 01/25/21 0155 01/26/21 0633 01/26/21 1222 01/26/21 1229  WBC 15.4*  --   --  15.6* 15.7* 15.5*  --   --  18.5*  --   CREATININE 4.32*   < > 3.96*  --  3.66* 3.87* 3.05* 4.04*  --   --   LATICACIDVEN  --   --   --   --   --   --   --   --   --  1.9   < > = values in this interval not displayed.    Estimated Creatinine Clearance: 21.6 mL/min (A) (by C-G formula based on SCr of 4.04 mg/dL (H)).    No Known Allergies  Antimicrobials this admission: Amp/Sul 9/19 >>   Dose adjustments this admission:   Microbiology results: 9/19 UCx: IP   Thank you for allowing pharmacy to be a part of this patient's care.  Thomas [name] Thomas [name] 01/26/2021 3:34 PM

## 2021-01-26 NOTE — Progress Notes (Signed)
RT NOTES: Pt sats 88% on room air via ATC. Placed patient on 28% ATC at this time. Sats now 99%. Will continue to monitor.

## 2021-01-26 NOTE — Plan of Care (Signed)

## 2021-01-26 NOTE — Progress Notes (Addendum)
[facility] PROGRESS NOTE  KALAI BACA WHQ:[id number] DOB: [birth year] DOA: 11/30/2020 PCP: [name], NP  Status: Remains inpatient appropriate because:Persistent severe electrolyte disturbances, Unsafe d/c plan, and Inpatient level of care appropriate due to severity of illness  Dispo: The patient is from: Home              Anticipated d/c is to: SNF versus LTAC vs CIR              Patient currently is not medically stable to d/c.   Difficult to place patient Yes               Barriers to discharge: Currently has tracheostomy in place and is requiring dialysis but remains too weak to tolerate dialysis in chair which is a requirement for outpatient HD    Level of care: Progressive  Code Status: Full Family Communication: Patient only; attempted to contact [name] both on the home phone and the mobile phone but was unsuccessful in reaching her DVT prophylaxis: Eliquis COVID vaccination status: Moderna 07/08/2019 and 08/11/2019 with first booster dose given 08/05/2020   HPI:  65 y.o. male with a history of colon cancer s/p resection and colostomy May 2022 s/p 3 cycles chemotherapy (last was July 2022), T2DM with neuropathy, HTN, HLD who presented to the ED 7/24 with abdominal pain found to be febrile, tachycardic, tachypneic. Lactic acid was 6.6 and persisted at 6.8 after sepsis IV fluid bolus and cefepime. He had ARF, demand ischemia, bandemia, was hyperglycemic with negative ketones, with lactic acidosis. He developed AFib with RVR in the ED, was started on diltiazem, and was admitted to the ICU for septic shock with unclear source. Sepsis was subsequently felt to be ruled out at time of admission, presentation felt more likely to be related to cepacitabine toxicity with oral mucositis, hand/foot syndrome, and diarrhea/high output colostomy. He was transferred to hospitalist service 7/27 but developed metabolic encephalopathy with hyoxia due to aspiration pneumonia on 7/30  requiring intubation and pressor support. Failed extubation with inability to clear secretions, requiring reintubation 8/8, and tracheostomy subsequently placed 8/11. This was pulled out 8/22, replaced under bronch guidance, again removed 8/25 requiring reintubation after failed reinsertion. ENT revised tracheostomy in the OR 8/29. He has required IHD for ARF, and also had complications of RLE DVT now on eliquis, colitis thought to be ischemic 8/2 not requiring specific intervention, Pseudomonas pneumonia having completed treatment, agitated encephalopathy which has improved, anemia requiring 4u PRBCs total, and thrombocytopenia which has resolved. On 9/4 he was transferred to hospitalist service having tolerated trach collar.  Subjective: Today reporting significant abdominal discomfort midline just above the suprapubic region with possible rebounding, definite guarding.  Hypoactive bowel sounds as well.  Patient becoming very frustrated over multiple medical issues.  Reassurance and emotional support given.   Objective: Vitals:   01/26/21 0328 01/26/21 0716  BP: (!) 153/75 113/72  Pulse: (!) 108 (!) 102  Resp: 20 17  Temp: 98 F (36.7 C) 98.1 F (36.7 C)  SpO2: 100% 98%    Intake/Output Summary (Last 24 hours) at 01/26/2021 0739 Last data filed at 01/26/2021 0422 Gross per 24 hour  Intake 187.19 ml  Output 300 ml  Net -112.81 ml   Filed Weights   01/23/21 0437 01/24/21 0850 01/24/21 1150  Weight: 103.3 kg 104.3 kg 104.4 kg    Exam: Constitutional: NAD, calm, uncomfortable secondary to abdominal pain Respiratory: #6 XLT uncuffed trach 9/9; 28% trach collar, clear to auscultation.  Lungs clear to auscultation, no secretions noted around trach.  Patient able to phonate around trach.  Marked decrease in secretions with Robinul Cardiovascular: S1-S2, minimal peripheral edema, no JVD, regular pulse, normotensive Abdomen: Soft and focally tender lower abdomen and suprapubic region,  hypoactive bowel sounds positive. LBM 9/19; colostomy with liquid stool; cortrack for tube feedings and medication Genitourinary: Fully catheter in place with dark yellow urine noted in drainage bag Neurologic: CN 2-12 grossly intact. Sensation intact, Strength 3/5 x all 4 extremities.  Psychiatric: Appears to have normal judgment and insight. Alert and oriented x 3.    Assessment/Plan: Acute problems:  Acute hypoxic respiratory failure s/p tracheostomy due to aspiration pneumonia  Recurrent pansensitive Pseudomonas tracheobronchitis -Tolerating PMV.  Trach team wants to make sure patient more mobile with stronger vocalization before trying capping trials. 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   - Continue HD TTS thru L IJ TDC per nephrology.  Patient needs to tolerate sitting up in chair for dialysis treatments --Improved urinary output over the past several days that is much as 1450 out on 9/16 -Continue OOB to chair TID-as he improves with mobility with PT will eventually do a trial run of chair dialysis to replicate outpatient requirements -Initially not expected to recover renal function nephrology now documented that he may be headed towards renal recovery   High output ostomy, diarrhea:  -Negative C. diff PCR (7/26) and GI pathogen panel (8/1). Possibly ischemic  colitis on CT 8/2-need to maintain blood pressure during dialysis treatment -9/13 Imodium dose increased with decrease in liquid stool volume --Continue fiber supplementation -Will DC PPI twice daily since this class of drug can promote diarrhea   Anemia of critical illness, acute blood loss as well as anemia of chronic disease and iron deficiency:  - s/p 4u PRBCs this hospitalization  - IV iron TTS x8 doses per nephrology. 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 Benay Spice. Started CAPOX June 2022, reduced cepecitabine dose starting 11/19/2020 due to hand/foot syndrome, reduced oxaliplatin due to renal impairment and poor performance status.  - Dr. Benay Spice has followed during this hospitalization. Per note 8/2 "[pt] is undergoing curative therapy for colon cancer..." - Palliative care has been involved during this hospitalization, though goals remain clear: full aggressive measures desired.    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  OT/PT note that if patient continues to make progress likely would be a candidate for CIR   Acute extensive right leg DVT:  Involving CFV, SF junction, proximal femoral and profunda veins and popliteal  veins. Dx 8/5.  Did not have significant edema therefore did not require thrombolysis - Continue eliquis   New onset atrial fibrillation with RVR:  -Currently maintaining sinus rhythm w/o meds - Continue eliquis as above   Seizure disorder:  - Continue keppra   Hand/foot syndrome related to xeloda:  -Dose reduced in the outpatient setting and given acuity of current symptoms this medication has not been utilized during hospitalization - No infection noted at this time. Will continue moisturizing topical Tx   Acute metabolic encephalopathy, agitated delirium:  -Resolved - Continue clonazepam 0.2m per tube BID and prn-taper and DC as tolerated - Continue quetiapine 540mBID-taper and DC as tolerated   Uncontrolled T2DM on OHAs prior to admission with associated neuropathy:  HbA1c 8.7% at admission. - CBGs well-controlled currently, continue levemir 15u BID and SSI q4h.  - Continue gabapentin 10074m8h.   HTN:  - Continue norvasc   HLD:  - Continue atorvastatin   Thrombocytopenia:  -Transient likely due to critical illness. HIT Ab negative on 8/5.    Stress ulcer prophylaxis -Continue PPI but consider discontinuing since this medication can contribute to loose stools and diarrhea   ?Sepsis - initially thought to be present on admission- has been ruled out.     Data Reviewed: Basic Metabolic Panel: Recent Labs  Lab 01/22/21 0203 01/23/21 0453 01/24/21 0122 01/25/21 0155 01/26/21 0633  NA 133* 133* 132* 135 132*  K 4.8 4.6 4.2 4.0 3.6  CL 87* 90* 89* 91* 89*  CO2 23 23 18* 23 22  GLUCOSE 190* 188* 154* 147* 219*  BUN 80* 68* 89* 50* 77*  CREATININE 3.96* 3.66* 3.87* 3.05* 4.04*  CALCIUM 9.8 9.8 10.0 9.0 9.3  MG  --   --  2.4  --   --   PHOS 10.9* 9.4* 10.8* 6.9* 8.9*   Liver Function Tests: Recent Labs  Lab 01/22/21 0203 01/23/21 0453 01/24/21 0122 01/25/21 0155 01/26/21 0633  ALBUMIN 2.9* 2.8* 2.7* 2.8* 2.8*    CBC: Recent Labs  Lab 01/20/21 0516  01/22/21 1003 01/23/21 0453 01/24/21 0122  WBC 15.4* 15.6* 15.7* 15.5*  HGB 9.4* 9.3* 8.7* 9.3*  HCT 28.1* 27.5* 26.6* 26.9*  MCV 85.9 86.5 86.9 84.6  PLT 339 260 244 241    CBG: Recent Labs  Lab 01/25/21 0753 01/25/21 1200 01/25/21 1558 01/25/21 2123 01/25/21 2326  GLUCAP 138* 116* 143* 140* 178*    Scheduled Meds:  amLODipine  5 mg Per Tube Daily   apixaban  5 mg Per Tube BID   vitamin C  500 mg Per Tube TID   atorvastatin  20 mg Per Tube Daily   chlorhexidine gluconate (MEDLINE KIT)  15 mL Mouth Rinse BID   Chlorhexidine Gluconate Cloth  6 each Topical Q0600   Chlorhexidine Gluconate Cloth  6 each Topical Q0600   Chlorhexidine Gluconate Cloth  6 each Topical Q0600   clonazepam  0.5 mg Per Tube BID   feeding supplement (PROSource TF)  45 mL Per Tube BID   fiber  1 packet Per Tube BID   gabapentin  100 mg Per  Tube Q8H   hydrocerin   Topical BID   insulin aspart  0-15 Units Subcutaneous Q4H   insulin detemir  15 Units Subcutaneous BID   levETIRAcetam  500 mg Oral BID   loperamide HCl  1 mg Per Tube QID   mouth rinse  15 mL Mouth Rinse 10 times per day   multivitamin  1 tablet Per Tube QHS   nystatin  5 mL Per Tube QID   QUEtiapine  50 mg Per Tube BID   sevelamer carbonate  2.4 g Per Tube TID WC   thiamine  100 mg Per Tube Daily   Continuous Infusions:  sodium chloride Stopped (12/20/20 0955)   anticoagulant sodium citrate     feeding supplement (NEPRO CARB STEADY) 1,000 mL (01/25/21 2246)    Principal Problem:   Diabetes mellitus with hyperosmolarity without hyperglycemic hyperosmolar nonketotic coma ([place]) Active Problems:   Lactic acidosis   Acute renal failure superimposed on stage 2 chronic kidney disease (HCC)   Metabolic acidosis   Tachycardia   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 ([place])   Adverse effect of chemotherapy   Pressure injury of skin   Atrial fibrillation with RVR ([place])   Sepsis ([place])   Malnutrition of  moderate degree   Acute metabolic encephalopathy   Acute respiratory failure with hypoxemia (HCC)   Acute renal failure with acute cortical necrosis ([place])   Shock ([place])   Status post tracheostomy ([place])   Endotracheal tube present   Hemodialysis status ([place])   Palliative care by specialist   SOB (shortness of breath)   Consultants: PCCM General surgery Nephrology Hematology/oncology Palliative care medicine team  Procedures: 2u PRBC 7/31, 1u PRBC 8/17, 1u PRBC 8/27 Tracheostomy revision 01/05/2021 Dr. Constance Holster    Antibiotics: Vancomycin 7/24 Cefepime 7/24 - 7/27 Unasyn 7/30 >> cefepime/flagyl 7/31 - 8/6     Time spent: 40 minutes    [name] ANP  [facility] 7 am - 330 pm/M-F for direct patient care and secure chat Please refer to Amion for contact info 56  days

## 2021-01-26 NOTE — Progress Notes (Signed)
RT NOTES: Pt a difficult airway being seen by ENT. Will wait on ENT for trach tube change.

## 2021-01-26 NOTE — Plan of Care (Signed)

## 2021-01-27 ENCOUNTER — Other Ambulatory Visit (HOSPITAL_COMMUNITY): Payer: Self-pay

## 2021-01-27 DIAGNOSIS — E1100 Type 2 diabetes mellitus with hyperosmolarity without nonketotic hyperglycemic-hyperosmolar coma (NKHHC): Secondary | ICD-10-CM | POA: Diagnosis not present

## 2021-01-27 LAB — GLUCOSE, CAPILLARY
Glucose-Capillary: 154 mg/dL — ABNORMAL HIGH (ref 70–99)
Glucose-Capillary: 171 mg/dL — ABNORMAL HIGH (ref 70–99)
Glucose-Capillary: 196 mg/dL — ABNORMAL HIGH (ref 70–99)
Glucose-Capillary: 179 mg/dL — ABNORMAL HIGH (ref 70–99)
Glucose-Capillary: 160 mg/dL — ABNORMAL HIGH (ref 70–99)

## 2021-01-27 LAB — CBC
HCT: 24 % — ABNORMAL LOW (ref 39.0–52.0)
Hemoglobin: 8.4 g/dL — ABNORMAL LOW (ref 13.0–17.0)
MCH: 29.3 pg (ref 26.0–34.0)
MCHC: 35 g/dL (ref 30.0–36.0)
MCV: 83.6 fL (ref 80.0–100.0)
Platelets: 301 10*3/uL (ref 150–400)
RBC: 2.87 MIL/uL — ABNORMAL LOW (ref 4.22–5.81)
RDW: 14.7 % (ref 11.5–15.5)
WBC: 14.3 10*3/uL — ABNORMAL HIGH (ref 4.0–10.5)
nRBC: 0 % (ref 0.0–0.2)

## 2021-01-27 LAB — RENAL FUNCTION PANEL
Albumin: 2.5 g/dL — ABNORMAL LOW (ref 3.5–5.0)
Anion gap: 20 — ABNORMAL HIGH (ref 5–15)
BUN: 94 mg/dL — ABNORMAL HIGH (ref 8–23)
CO2: 23 mmol/L (ref 22–32)
Calcium: 9.4 mg/dL (ref 8.9–10.3)
Chloride: 88 mmol/L — ABNORMAL LOW (ref 98–111)
Creatinine, Ser: 4.52 mg/dL — ABNORMAL HIGH (ref 0.61–1.24)
GFR, Estimated: 14 mL/min — ABNORMAL LOW (ref >60)
Glucose, Bld: 179 mg/dL — ABNORMAL HIGH (ref 70–99)
Phosphorus: 9.8 mg/dL — ABNORMAL HIGH (ref 2.5–4.6)
Potassium: 3.4 mmol/L — ABNORMAL LOW (ref 3.5–5.1)
Sodium: 131 mmol/L — ABNORMAL LOW (ref 135–145)

## 2021-01-27 LAB — HEPATITIS B SURFACE ANTIBODY,QUALITATIVE: Hep B S Ab: NONREACTIVE

## 2021-01-27 LAB — HEPATITIS B SURFACE ANTIGEN: Hepatitis B Surface Ag: NONREACTIVE

## 2021-01-27 MED ORDER — PENTAFLUOROPROP-TETRAFLUOROETH EX AERO: 1.0000 "application " | INHALATION_SPRAY | CUTANEOUS | Status: DC | PRN

## 2021-01-27 MED ORDER — SODIUM CHLORIDE 0.9 % IV SOLN: 100.0000 mL | INTRAVENOUS | Status: DC | PRN

## 2021-01-27 MED ORDER — ANTICOAGULANT SODIUM CITRATE 4% (200MG/5ML) IV SOLN
5.0000 mL | Freq: Once | Status: DC
  Filled 2021-01-27: qty 5

## 2021-01-27 MED ORDER — LIDOCAINE HCL (PF) 1 % IJ SOLN: 5.0000 mL | INTRAMUSCULAR | Status: DC | PRN

## 2021-01-27 MED ORDER — LIDOCAINE-PRILOCAINE 2.5-2.5 % EX CREA: 1.0000 "application " | TOPICAL_CREAM | CUTANEOUS | Status: DC | PRN

## 2021-01-27 MED ORDER — ALTEPLASE 2 MG IJ SOLR: 2.0000 mg | Freq: Once | INTRAMUSCULAR | Status: DC | PRN

## 2021-01-27 MED ORDER — HEPARIN SODIUM (PORCINE) 1000 UNIT/ML DIALYSIS
1000.0000 [IU] | INTRAMUSCULAR | Status: DC | PRN
  Filled 2021-01-27: qty 1

## 2021-01-27 NOTE — Plan of Care (Signed)
  Problem: Education: Goal: Knowledge of General Education information will improve Description: Including pain rating scale, medication(s)/side effects and non-pharmacologic comfort measures Outcome: Progressing   Problem: Clinical Measurements: Goal: Ability to maintain clinical measurements within normal limits will improve Outcome: Progressing Goal: Will remain free from infection Outcome: Progressing Goal: Diagnostic test results will improve Outcome: Progressing Goal: Respiratory complications will improve Outcome: Progressing Goal: Cardiovascular complication will be avoided Outcome: Progressing   Problem: Activity: Goal: Risk for activity intolerance will decrease Outcome: Progressing   Problem: Nutrition: Goal: Adequate nutrition will be maintained Outcome: Progressing   Problem: Elimination: Goal: Will not experience complications related to bowel motility Outcome: Progressing Goal: Will not experience complications related to urinary retention Outcome: Progressing   Problem: Pain Managment: Goal: General experience of comfort will improve Outcome: Progressing   Problem: Safety: Goal: Ability to remain free from injury will improve Outcome: Progressing   Problem: Skin Integrity: Goal: Risk for impaired skin integrity will decrease Outcome: Progressing   Problem: Safety: Goal: Non-violent Restraint(s) Outcome: Progressing   Problem: Health Behavior/Discharge Planning: Goal: Ability to manage health-related needs will improve Outcome: Not Progressing   Problem: Coping: Goal: Level of anxiety will decrease Outcome: Not Progressing

## 2021-01-27 NOTE — Progress Notes (Signed)
PROGRESS NOTE    Thomas [name]  WUJ:[id number] DOB: [birth year] DOA: 11/30/2020 PCP: [name], NP    Brief Narrative:  Thomas [name] is a 65 y.o. male with a history of colon cancer s/p resection and colostomy May 2022 s/p 3 cycles chemotherapy (last was July 2022), T2DM with neuropathy, HTN, HLD who presented to the ED 7/24 with abdominal pain found to be febrile, tachycardic, tachypneic. Lactic acid was 6.6 and persisted at 6.8 after sepsis IV fluid bolus and cefepime. He had ARF, demand ischemia, bandemia, was hyperglycemic with negative ketones, with lactic acidosis. He developed AFib with RVR in the ED, was started on diltiazem, and was admitted to the ICU for septic shock with unclear source. Sepsis was subsequently felt to be ruled out at time of admission, presentation felt more likely to be related to cepacitabine toxicity with oral mucositis, hand/foot syndrome, and diarrhea/high output colostomy.   He was transferred to hospitalist service 7/27 but developed metabolic encephalopathy with hyoxia due to aspiration pneumonia on 7/30 requiring intubation and pressor support. Failed extubation with inability to clear secretions, requiring reintubation 8/8, and tracheostomy subsequently placed 8/11. This was pulled out 8/22, replaced under bronch guidance, again removed 8/25 requiring reintubation after failed reinsertion. ENT revised tracheostomy in the OR 8/29. He has required IHD for ARF, and also had complications of RLE DVT now on eliquis, colitis thought to be ischemic 8/2 not requiring specific intervention, Pseudomonas pneumonia having completed treatment, agitated encephalopathy which has improved, anemia requiring 4u PRBCs total, and thrombocytopenia which has resolved.   On 9/4 he was transferred to hospitalist service having tolerated trach collar.   Assessment & Plan:   Principal Problem:   Diabetes mellitus with hyperosmolarity without hyperglycemic hyperosmolar nonketotic coma  (HCC) Active Problems:   Lactic acidosis   Acute renal failure superimposed on stage 2 chronic kidney disease (HCC)   Metabolic acidosis   Tachycardia   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 (HCC)   Adverse effect of chemotherapy   Pressure injury of skin   Atrial fibrillation with RVR (HCC)   Sepsis (HCC)   Malnutrition of moderate degree   Acute metabolic encephalopathy   Acute respiratory failure with hypoxemia (HCC)   Acute renal failure with acute cortical necrosis (HCC)   Shock ([place])   Status post tracheostomy ([place])   Endotracheal tube present   Hemodialysis status ([place])   Palliative care by specialist   SOB (shortness of breath)   Acute hypoxic respiratory failure s/p tracheostomy due to aspiration pneumonia  Recurrent pansensitive Pseudomonas tracheobronchitis --continue SLP, tolerating PMV.   --Continue Fortaz on TTS schedule with pharmacy dosing  --ENT changed to cuffless trach on 9/9  Abdominal pain secondary to UTI Patient with acute abdominal pain on 9/19, localized to the suprapubic region.  CT abdomen/pelvis with no intra-abdominal abscess, no bowel obstruction or inflammation.  Urinalysis with large leukocytes, negative nitrite, many bacteria, greater than 50 WBCs. --Urine culture: Pending --Continue Unasyn   Oral Candida Completed course of Diflucan.   Acute renal failure on stage IIIb CKD: Presumed ischemic ATN --Nephrology following, appreciate assistance --started CRRT 7/31 > iHD 8/12 s/p left IJ [facility] 8/15.   --Continue HD TTS thru L IJ TDC per nephrology.  Patient needs to tolerate sitting up in chair for dialysis treatments   High output ostomy, diarrhea:  --Negative C. diff PCR (7/26) and GI pathogen panel (8/1). Possibly ischemic colitis on CT 8/2-need to maintain blood pressure during dialysis treatment --  PPI discontinued --continue to monitor ostomy output   Anemia of critical illness, acute blood loss as well as anemia  of chronic disease and iron deficiency:  --s/p 4u PRBCs this hospitalization  --Getting IV iron per nephrology --Current hemoglobin stable at 10.6 with normal platelets   Stage IIIb colon CA s/p partial transverse colectomy with end colostomy May 2022:  Followed by Dr. Benay Spice. Started CAPOX June 2022, reduced cepecitabine dose starting 11/19/2020 due to hand/foot syndrome, reduced oxaliplatin due to renal impairment and poor performance status. Per note 8/2 "[pt] is undergoing curative therapy for colon cancer..." --Palliative care has been involved during this hospitalization, though goals remain clear: full aggressive measures desired.    Moderate protein calorie malnutrition/dysphagia Nutrition Status: Nutrition Problem: Moderate Malnutrition Etiology: chronic illness, cancer and cancer related treatments Signs/Symptoms: mild fat depletion, mild muscle depletion, percent weight loss Percent weight loss: 11 % Interventions: Prostat, Tube feeding, MVI --Continue tube feeds via cortrack-hopefully will begin to progress enough with SLP that we can begin trials of oral intake high in hopes of avoiding placement of PEG tube  --Continue MVM, prostat per RD --SLP following  Physical deconditioning/back pain --Secondary to prolonged critical illness and 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    Acute extensive right leg DVT:  Involving CFV, SF junction, proximal femoral and profunda veins and popliteal veins. Dx 8/5.  Did not have significant edema therefore did not require thrombolysis --Continue eliquis   New onset atrial fibrillation with RVR:  -Currently maintaining sinus rhythm w/o meds --Continue eliquis as above   Seizure disorder:  --Continue keppra   Hand/foot syndrome related to xeloda:  --Dose reduced in the outpatient setting and given acuity of current symptoms this medication has not been utilized during  hospitalization --No infection noted at this time. Will continue moisturizing topical Tx   Acute metabolic encephalopathy, agitated delirium: Resolved --Continue clonazepam 0.4m per tube BID and prn-taper and DC as tolerated --Continue quetiapine 517mBID-taper and DC as tolerated   Uncontrolled T2DM on OHAs prior to admission with associated neuropathy:  HbA1c 8.7% at admission. --Levemir 15u BID and SSI q4h.  --Continue gabapentin 10024m8h.   HTN:  --Continue norvasc 5 mg daily   HLD:  --Continue atorvastatin 20 mg daily   Thrombocytopenia:  --Transient likely due to critical illness. HIT Ab negative on 8/5.   Sepsis: ruled out --initially thought to be present on admission- has been ruled out.    DVT prophylaxis:  apixaban (ELIQUIS) tablet 5 mg    Code Status: Full Code Family Communication: No family present at bedside this morning  Disposition Plan:  Level of care: Progressive Status is: Inpatient  Remains inpatient appropriate because:Unsafe d/c plan, IV treatments appropriate due to intensity of illness or inability to take PO, and Inpatient level of care appropriate due to severity of illness  Dispo: The patient is from: Home              Anticipated d/c is to: SNF              Patient currently is not medically stable to d/c.   Difficult to place patient No   Consultants:  Palliative care PCCM General surgery Nephrology Hematology/oncology  Procedures:  2u PRBC 7/31, 1u PRBC 8/17, 1u PRBC 8/27 Tracheostomy revision 01/05/2021 Dr. RosConstance Holsterntimicrobials:  Vancomycin 7/24 - 7/25, 8/2 - 8/2 Cefepime 7/24 - 7/27 Unasyn 7/30 >> cefepime/flagyl 7/31 - 8/6  Fortaz on TTS w/ HD 9/8 - 9/18 Unasyn 9/19>>    Subjective: Patient seen examined at bedside, resting comfortably.  Abdominal pain improved.  Currently in hemodialysis.  Denies headache, no chest pain, no shortness of breath more than his typical baseline, no fever/chills/night sweats, no  nausea/vomiting.  No acute events overnight per nurse staff.  Objective: Vitals:   01/27/21 1030 01/27/21 1100 01/27/21 1116 01/27/21 1130  BP: (!) 90/50 109/74  106/79  Pulse: 78 (!) 108 (!) 109 (!) 112  Resp:   (!) 25   Temp:      TempSrc:      SpO2:   98%   Weight:      Height:        Intake/Output Summary (Last 24 hours) at 01/27/2021 1226 Last data filed at 01/27/2021 0800 Gross per 24 hour  Intake 1178.57 ml  Output 900 ml  Net 278.57 ml   Filed Weights   01/24/21 1150 01/27/21 0454 01/27/21 0833  Weight: 104.4 kg 103 kg 102 kg    Examination:  General exam: Appears calm and comfortable, chronically ill, cachectic in appearance,cortrak noted in place Respiratory system: Decreased breath sounds bilateral bases, normal respiratory effort without accessory muscle use, trach noted, on trach collar Cardiovascular system: S1 & S2 heard, RRR. No JVD, murmurs, rubs, gallops or clicks. No pedal edema. Gastrointestinal system: Abdomen is nondistended, soft and nontender. No organomegaly or masses felt. Normal bowel sounds heard.  Ostomy noted. Central nervous system: Alert and oriented. No focal neurological deficits. Extremities: Symmetric 5 x 5 power. Skin: No rashes, lesions or ulcers Psychiatry: Judgement and insight appear normal. Mood & affect appropriate.     Data Reviewed: I have personally reviewed following labs and imaging studies  CBC: Recent Labs  Lab 01/22/21 1003 01/23/21 0453 01/24/21 0122 01/26/21 1222 01/27/21 0734  WBC 15.6* 15.7* 15.5* 18.5* 14.3*  NEUTROABS  --   --   --  14.9*  --   HGB 9.3* 8.7* 9.3* 9.1* 8.4*  HCT 27.5* 26.6* 26.9* 26.8* 24.0*  MCV 86.5 86.9 84.6 84.8 83.6  PLT 260 244 241 319 409   Basic Metabolic Panel: Recent Labs  Lab 01/23/21 0453 01/24/21 0122 01/25/21 0155 01/26/21 0633 01/27/21 0500  NA 133* 132* 135 132* 131*  K 4.6 4.2 4.0 3.6 3.4*  CL 90* 89* 91* 89* 88*  CO2 23 18* _0 GLUCOSE 188* 154* 147* 219*  179*  BUN 68* 89* 50* 77* 94*  CREATININE 3.66* 3.87* 3.05* 4.04* 4.52*  CALCIUM 9.8 10.0 9.0 9.3 9.4  MG  --  2.4  --   --   --   PHOS 9.4* 10.8* 6.9* 8.9* 9.8*   GFR: Estimated Creatinine Clearance: 19.1 mL/min (A) (by C-G formula based on SCr of 4.52 mg/dL (H)). Liver Function Tests: Recent Labs  Lab 01/23/21 0453 01/24/21 0122 01/25/21 0155 01/26/21 8119 01/27/21 0500  ALBUMIN 2.8* 2.7* 2.8* 2.8* 2.5*   No results for input(s): LIPASE, AMYLASE in the last 168 hours. No results for input(s): AMMONIA in the last 168 hours. Coagulation Profile: No results for input(s): INR, PROTIME in the last 168 hours. Cardiac Enzymes: No results for input(s): CKTOTAL, CKMB, CKMBINDEX, TROPONINI in the last 168 hours. BNP (last 3 results) No results for input(s): PROBNP in the last 8760 hours. HbA1C: No results for input(s): HGBA1C in the last 72 hours. CBG: Recent Labs  Lab 01/26/21 1532 01/26/21 1930 01/26/21 2314 01/27/21 0354 01/27/21 0718  GLUCAP 150*  137* 180* 154* 171*   Lipid Profile: No results for input(s): CHOL, HDL, LDLCALC, TRIG, CHOLHDL, LDLDIRECT in the last 72 hours. Thyroid Function Tests: No results for input(s): TSH, T4TOTAL, FREET4, T3FREE, THYROIDAB in the last 72 hours. Anemia Panel: No results for input(s): VITAMINB12, FOLATE, FERRITIN, TIBC, IRON, RETICCTPCT in the last 72 hours. Sepsis Labs: Recent Labs  Lab 01/26/21 1229  LATICACIDVEN 1.9    No results found for this or any previous visit (from the past 240 hour(s)).        Radiology Studies: CT ABDOMEN PELVIS WO CONTRAST  Result Date: 01/26/2021 CLINICAL DATA:  Abdominal abscess or infection suspected. EXAM: CT ABDOMEN AND PELVIS WITHOUT CONTRAST TECHNIQUE: Multidetector CT imaging of the abdomen and pelvis was performed following the standard protocol without IV contrast. COMPARISON:  12/27/2020 FINDINGS: Lower chest: Atelectasis or infiltration demonstrated in the lung bases. Possible  pneumonia. Hepatobiliary: No focal liver abnormality is seen. No gallstones, gallbladder wall thickening, or biliary dilatation. Pancreas: Unremarkable. No pancreatic ductal dilatation or surrounding inflammatory changes. Spleen: Normal spleen size.  No focal lesions identified. Adrenals/Urinary Tract: Cyst on the left kidney is unchanged. No hydronephrosis or hydroureter. No adrenal gland nodules. Bladder is decompressed with a Foley catheter in place. Stomach/Bowel: Enteric tube with tip in the distal stomach. Partial right hemicolectomy with diverting ileostomy in the right lower quadrant. Contrast material flows through to the stoma without evidence of obstruction. The distal colon is decompressed. No wall thickening or inflammatory changes. No mesenteric collections. Vascular/Lymphatic: Aortic atherosclerosis. No enlarged abdominal or pelvic lymph nodes. Reproductive: Prostate is unremarkable. Other: Small left inguinal hernia containing fat. No free air or free fluid in the abdomen. Musculoskeletal: No acute or significant osseous findings. IMPRESSION: 1. No intra-abdominal abscess identified. 2. No evidence of bowel obstruction or inflammation. 3. Infiltration or atelectasis in the lung bases could indicate pneumonia. Electronically Signed   By: Lucienne Capers M.D.   On: 01/26/2021 19:03        Scheduled Meds:  amLODipine  5 mg Per Tube Daily   apixaban  5 mg Per Tube BID   vitamin C  500 mg Per Tube TID   atorvastatin  20 mg Per Tube Daily   chlorhexidine gluconate (MEDLINE KIT)  15 mL Mouth Rinse BID   Chlorhexidine Gluconate Cloth  6 each Topical Q0600   Chlorhexidine Gluconate Cloth  6 each Topical Q0600   Chlorhexidine Gluconate Cloth  6 each Topical Q0600   clonazepam  0.5 mg Per Tube BID   feeding supplement (PROSource TF)  45 mL Per Tube BID   fiber  1 packet Per Tube BID   gabapentin  100 mg Per Tube Q8H   hydrocerin   Topical BID   insulin aspart  0-15 Units Subcutaneous Q4H    insulin detemir  15 Units Subcutaneous BID   levETIRAcetam  500 mg Oral BID   loperamide HCl  1 mg Per Tube QID   mouth rinse  15 mL Mouth Rinse 10 times per day   multivitamin  1 tablet Per Tube QHS   nystatin  5 mL Per Tube QID   pantoprazole sodium  40 mg Per Tube Daily   QUEtiapine  50 mg Per Tube BID   scopolamine  1 patch Transdermal Q72H   sevelamer carbonate  2.4 g Per Tube TID WC   thiamine  100 mg Per Tube Daily   Continuous Infusions:  sodium chloride Stopped (12/20/20 0955)   sodium chloride  sodium chloride     ampicillin-sulbactam (UNASYN) IV 3 g (01/26/21 1832)   anticoagulant sodium citrate     anticoagulant sodium citrate     anticoagulant sodium citrate     feeding supplement (NEPRO CARB STEADY) 1,000 mL (01/26/21 1856)     LOS: 57 days    Time spent: 36 minutes spent on chart review, discussion with nursing staff, consultants, updating family and interview/physical exam; more than 50% of that time was spent in counseling and/or coordination of care.    Yann Biehn J [place] DO [facility] Available via Epic secure chat 7am-7pm After these hours, please refer to coverage provider listed on [email or web address] 01/27/2021, 12:26 PM

## 2021-01-27 NOTE — Progress Notes (Signed)
Speech Language Pathology Treatment: Nada Boozer Speaking valve  Patient Details Name: Thomas [name] MRN: [id number] DOB: [birth year] Today's Date: 01/27/2021 Time: 1511-1530 SLP Time Calculation (min) (ACUTE ONLY): 19 min  Assessment / Plan / Recommendation Clinical Impression  SLP followed up for PMSV intervention and readiness for PO trials. Pt on trach collar, per RN has had baseline elevated HR this date (110s). Placed PMSV without change in baseline vitals. Pt able to expectorate some upper airway sputum and use yankauer for oral suction. Phonation notably improving as compared to prior SLP interactions. Overall breath support remains decreased for vocal intensity. SLP attempted diligent oral care. Pt appeared to have dried mucous at posterior oral cavity. Pt with anxiety this date during gentle oral care despite education and cueing. Will continue PMSV use as tolerated during wake hours and diligent oral care to reduce infection risk. SLP to continue to follow.    HPI HPI: Pt is a 65 y/o male admitted on 7/25 for worsening sepsis vs reaction to chemotherapy with high ostomy output and soft BP. Pt developed worsening hypoxia and was requiring BIPAP w/ FIO2 of 100% and increased work of breathing. SLP consulted due to concern for aspiration. BSE 7/28 recommended NPO status and MBS, but this could not completed subsequently due to lethargy. CXR in AM of 7/30 showed some worsening concern of aspiration. ETT 7/30-8/8; tolerated nasal cannula for about an hour; reintubated 8/8-trach 8/11. Pt pulled out trach x2 8/22; replaced by PCCM. Bronch on 8/22 revealed endotracheal granulation tissue at trach site. Trach removed again on 8/25; unable to reinsert despite multiple attenpts; pt reintubated 8/25- trach by ENT on 8/29. BSE 8/22 limited to ice chips due to pt's refusal of other consistencies; rx NPO status with Cortrak. 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      SLP Plan  Continue with current plan of care      Recommendations for follow up therapy are one component of a multi-disciplinary discharge planning process, led by the attending physician.  Recommendations may be updated based on patient status, additional functional criteria and insurance authorization.    Recommendations  Diet recommendations: NPO Medication Administration: Via alternative means      Patient may use Passy-Muir Speech Valve: During all waking hours (remove during sleep) PMSV Supervision: Intermittent MD: Please consider changing trach tube to : Smaller size         Oral Care Recommendations: Oral care QID;Staff/trained caregiver to provide oral care Follow up Recommendations: Other (comment) (TBD) SLP Visit Diagnosis: Aphonia (R49.1) Plan: Continue with current plan of care       Sebastian, CCC-SLP Acute Rehabilitation Services    01/27/2021, 3:38 PM

## 2021-01-27 NOTE — Progress Notes (Signed)
[facility] ROUNDING NOTE   Subjective:   Interval History: 65 year old gentleman with a history of colon cancer status postresection and colostomy May 2022.  Status post 3 cycles of chemotherapy last was in July 2022.  Type 2 diabetes with neuropathy hypertension hyperlipidemia presented to the emergency room 11/30/2020 with abdominal pain.  Was found to be secondary to sepsis.  Patient also was found to have acute kidney injury.  Developed atrial fibrillation with rapid ventricular rate and was admitted to intensive care unit for septic shock.  On 12/03/2020 he developed metabolic encephalopathy with hypoxia and aspiration pneumonia and required intubation 12/06/2020.  He also required pressor support.  He was reintubated since 12/15/2020 tracheostomy placed.  His hospital course was complicated by DVT.  It appears that he has recurrent Pseudomonas pneumonia.  He also has recurrent anemia requiring transfusion of 4 units of packed red blood cells.  Blood pressure 109/74 pulse 109 temperature 98.6.  Sodium 131 potassium 3.4 chloride 88 CO2 23 BUN 94 creatinine 4.52 glucose 179 hemoglobin 8.4 calcium 9.4 phosphorus 9.8 albumin 2.5  Urine output 425 cc 01/25/2021.  Dialysis 01/24/2021 with 1 L removed.  Patient is receiving dialysis 01/27/2021  Objective:  Vital signs in last 24 hours:  Temp:  [98 F (36.7 C)-98.6 F (37 C)] 98.6 F (37 C) (09/20 0833) Pulse Rate:  [78-109] 109 (09/20 1116) Resp:  [13-25] 25 (09/20 1116) BP: (90-162)/(50-81) 109/74 (09/20 1100) SpO2:  [96 %-100 %] 98 % (09/20 1116) FiO2 (%):  [28 %-35 %] 35 % (09/20 1116) Weight:  [102 kg-103 kg] 102 kg (09/20 0833)  Weight change:  Filed Weights   01/24/21 1150 01/27/21 0454 01/27/21 0833  Weight: 104.4 kg 103 kg 102 kg    Intake/Output: I/O last 3 completed shifts: In: 4166.6 [NG/GT:3778.8; IV Piggyback:387.8] Out: 725 [Urine:425; Stool:300]   Intake/Output this shift:  Total I/O In: -  Out: 900  [Urine:500; Stool:400]  General: Ill-looking male, with tracheostomy on trach collar, not in distress Heart:RRR, s1s2 nl Lungs: normal wob Abdomen:soft, Non-tender, non-distended Extremities: No edema. Neurology: Alert awake and following commands. Dialysis Access: [facility] in place.    Basic Metabolic Panel: Recent Labs  Lab 01/23/21 0453 01/24/21 0122 01/25/21 0155 01/26/21 0633 01/27/21 0500  NA 133* 132* 135 132* 131*  K 4.6 4.2 4.0 3.6 3.4*  CL 90* 89* 91* 89* 88*  CO2 23 18* 23 22 23   GLUCOSE 188* 154* 147* 219* 179*  BUN 68* 89* 50* 77* 94*  CREATININE 3.66* 3.87* 3.05* 4.04* 4.52*  CALCIUM 9.8 10.0 9.0 9.3 9.4  MG  --  2.4  --   --   --   PHOS 9.4* 10.8* 6.9* 8.9* 9.8*     Liver Function Tests: Recent Labs  Lab 01/23/21 0453 01/24/21 0122 01/25/21 0155 01/26/21 0633 01/27/21 0500  ALBUMIN 2.8* 2.7* 2.8* 2.8* 2.5*    No results for input(s): LIPASE, AMYLASE in the last 168 hours. No results for input(s): AMMONIA in the last 168 hours.  CBC: Recent Labs  Lab 01/22/21 1003 01/23/21 0453 01/24/21 0122 01/26/21 1222 01/27/21 0734  WBC 15.6* 15.7* 15.5* 18.5* 14.3*  NEUTROABS  --   --   --  14.9*  --   HGB 9.3* 8.7* 9.3* 9.1* 8.4*  HCT 27.5* 26.6* 26.9* 26.8* 24.0*  MCV 86.5 86.9 84.6 84.8 83.6  PLT 260 244 241 319 301     Cardiac Enzymes: No results for input(s): CKTOTAL, CKMB, CKMBINDEX, TROPONINI in the last  168 hours.  BNP: Invalid input(s): POCBNP  CBG: Recent Labs  Lab 01/26/21 1532 01/26/21 1930 01/26/21 2314 01/27/21 0354 01/27/21 0718  GLUCAP 150* 137* 180* 154* 171*     Microbiology: Results for orders placed or performed during the hospital encounter of 11/30/20  Resp Panel by RT-PCR (Flu A&B, Covid) Nasopharyngeal Swab     Status: None   Collection Time: 11/30/20 10:05 PM   Specimen: Nasopharyngeal Swab; Nasopharyngeal(NP) swabs in vial transport medium  Result Value Ref Range Status   SARS Coronavirus 2 by RT PCR  NEGATIVE NEGATIVE Final    Comment: (NOTE) SARS-CoV-2 target nucleic acids are NOT DETECTED.  The SARS-CoV-2 RNA is generally detectable in upper respiratory specimens during the acute phase of infection. The lowest concentration of SARS-CoV-2 viral copies this assay can detect is 138 copies/mL. A negative result does not preclude SARS-Cov-2 infection and should not be used as the sole basis for treatment or other patient management decisions. A negative result may occur with  improper specimen collection/handling, submission of specimen other than nasopharyngeal swab, presence of viral mutation(s) within the areas targeted by this assay, and inadequate number of viral copies(<138 copies/mL). A negative result must be combined with clinical observations, patient history, and epidemiological information. The expected result is Negative.  Fact Sheet for Patients:  EntrepreneurPulse.com.au  Fact Sheet for Healthcare Providers:  IncredibleEmployment.be  This test is no t yet approved or cleared by the [place] FDA and  has been authorized for detection and/or diagnosis of SARS-CoV-2 by FDA under an Emergency Use Authorization (EUA). This EUA will remain  in effect (meaning this test can be used) for the duration of the COVID-19 declaration under Section 564(b)(1) of the Act, 21 U.S.C.section 360bbb-3(b)(1), unless the authorization is terminated  or revoked sooner.       Influenza A by PCR NEGATIVE NEGATIVE Final   Influenza B by PCR NEGATIVE NEGATIVE Final    Comment: (NOTE) The Xpert Xpress SARS-CoV-2/FLU/RSV plus assay is intended as an aid in the diagnosis of influenza from Nasopharyngeal swab specimens and should not be used as a sole basis for treatment. Nasal washings and aspirates are unacceptable for Xpert Xpress SARS-CoV-2/FLU/RSV testing.  Fact Sheet for Patients: EntrepreneurPulse.com.au  Fact Sheet for  Healthcare Providers: IncredibleEmployment.be  This test is not yet approved or cleared by the [place] FDA and has been authorized for detection and/or diagnosis of SARS-CoV-2 by FDA under an Emergency Use Authorization (EUA). This EUA will remain in effect (meaning this test can be used) for the duration of the COVID-19 declaration under Section 564(b)(1) of the Act, 21 U.S.C. section 360bbb-3(b)(1), unless the authorization is terminated or revoked.  Performed at [facility], [place] [address], [place], [place] [address]   Blood Culture (routine x 2)     Status: None   Collection Time: 11/30/20 10:05 PM   Specimen: BLOOD  Result Value Ref Range Status   Specimen Description   Final    BLOOD LEFT ANTECUBITAL Performed at [place] [address] [place], [place] [address]    Special Requests   Final    BOTTLES DRAWN AEROBIC AND ANAEROBIC Blood Culture results may not be optimal due to an inadequate volume of blood received in culture bottles Performed at [place] [address], [place], [place] [address]    Culture   Final    NO GROWTH 5 DAYS Performed at [facility], [place],  [place] [address]    Report Status 12/06/2020 FINAL  Final  Blood Culture (routine x 2)     Status: None   Collection Time: 11/30/20 10:33 PM   Specimen: BLOOD  Result Value Ref Range Status   Specimen Description   Final    BLOOD RIGHT ANTECUBITAL Performed at [place] [address] [place], [place] [address]    Special Requests   Final    BOTTLES DRAWN AEROBIC AND ANAEROBIC Blood Culture adequate volume Performed at [place] [address], [place], [place] [address]    Culture   Final    NO GROWTH 5 DAYS Performed at [facility], [place] [address] [place], [place] [address]    Report Status 12/06/2020 FINAL  Final  Urine  Culture     Status: None   Collection Time: 12/01/20  2:00 AM   Specimen: In/Out Cath Urine  Result Value Ref Range Status   Specimen Description   Final    IN/OUT CATH URINE Performed at [place] [facility] [address], [place], [place] [address]    Special Requests   Final    NONE Performed at [facility], [place] [address], [place], [place] [address]    Culture   Final    NO GROWTH Performed at [facility], [place] [address], [place], [place] [address]    Report Status 12/02/2020 FINAL  Final  MRSA Next Gen by PCR, Nasal     Status: None   Collection Time: 12/01/20  8:28 AM   Specimen: Nasal Mucosa; Nasal Swab  Result Value Ref Range Status   MRSA by PCR Next Gen NOT DETECTED NOT DETECTED Final    Comment: (NOTE) The GeneXpert MRSA Assay (FDA approved for NASAL specimens only), is one component of a comprehensive MRSA colonization surveillance program. It is not intended to diagnose MRSA infection nor to guide or monitor treatment for MRSA infections. Test performance is not FDA approved in patients less than 68 years old. Performed at [facility], [place] [address], [place], [place] [address]   C Difficile Quick Screen w PCR reflex     Status: None   Collection Time: 12/02/20  4:33 PM   Specimen: STOOL  Result Value Ref Range Status   C Diff antigen NEGATIVE NEGATIVE Final   C Diff toxin NEGATIVE NEGATIVE Final   C Diff interpretation No C. difficile detected.  Final    Comment: Performed at [facility], [place] [address], [place], [place] [address]  Culture, Respiratory w Gram Stain     Status: None   Collection Time: 12/07/20  8:14 AM   Specimen: Tracheal Aspirate; Respiratory  Result Value Ref Range Status   Specimen Description   Final    TRACHEAL ASPIRATE Performed at [facility] [address] [place], [place] [address]    Special Requests   Final    NONE Performed  at [facility], [place] [address] [place], [place] [address]    Gram Stain   Final    FEW WBC PRESENT,BOTH PMN AND MONONUCLEAR ABUNDANT YEAST FEW GRAM POSITIVE RODS Performed at [facility], [place] [address] [place] [address], [place],  [address]    Culture MODERATE CANDIDA TROPICALIS  Final   Report Status 12/09/2020 FINAL  Final  Gastrointestinal Panel by PCR , Stool     Status: None   Collection Time: 12/08/20  2:05 PM  Specimen: Stool  Result Value Ref Range Status   Campylobacter species NOT DETECTED NOT DETECTED Final   Plesimonas shigelloides NOT DETECTED NOT DETECTED Final   Salmonella species NOT DETECTED NOT DETECTED Final   Yersinia enterocolitica NOT DETECTED NOT DETECTED Final   Vibrio species NOT DETECTED NOT DETECTED Final   Vibrio cholerae NOT DETECTED NOT DETECTED Final   Enteroaggregative E coli (EAEC) NOT DETECTED NOT DETECTED Final   Enteropathogenic E coli (EPEC) NOT DETECTED NOT DETECTED Final   Enterotoxigenic E coli (ETEC) NOT DETECTED NOT DETECTED Final   Shiga like toxin producing E coli (STEC) NOT DETECTED NOT DETECTED Final   Shigella/Enteroinvasive E coli (EIEC) NOT DETECTED NOT DETECTED Final   Cryptosporidium NOT DETECTED NOT DETECTED Final   Cyclospora cayetanensis NOT DETECTED NOT DETECTED Final   Entamoeba histolytica NOT DETECTED NOT DETECTED Final   Giardia lamblia NOT DETECTED NOT DETECTED Final   Adenovirus F40/41 NOT DETECTED NOT DETECTED Final   Astrovirus NOT DETECTED NOT DETECTED Final   Norovirus GI/GII NOT DETECTED NOT DETECTED Final   Rotavirus A NOT DETECTED NOT DETECTED Final   Sapovirus (I, II, IV, and V) NOT DETECTED NOT DETECTED Final    Comment: Performed at [facility], [place] [place], [place] [address]  Culture, Respiratory w Gram Stain     Status: None   Collection Time: 12/22/20 10:13 AM   Specimen: Tracheal Aspirate; Respiratory  Result Value Ref Range Status   Specimen Description TRACHEAL  ASPIRATE  Final   Special Requests NONE  Final   Gram Stain   Final    FEW SQUAMOUS EPITHELIAL CELLS PRESENT FEW WBC SEEN ABUNDANT GRAM POSITIVE COCCI FEW GRAM VARIABLE ROD Performed at [facility], [place] [address], [place], [place] [address]    Culture RARE PSEUDOMONAS AERUGINOSA  Final   Report Status 12/26/2020 FINAL  Final   Organism ID, Bacteria PSEUDOMONAS AERUGINOSA  Final      Susceptibility   Pseudomonas aeruginosa - MIC*    CEFTAZIDIME <=1 SENSITIVE Sensitive     GENTAMICIN <=1 SENSITIVE Sensitive     PIP/TAZO 8 SENSITIVE Sensitive     * RARE PSEUDOMONAS AERUGINOSA  MRSA Next Gen by PCR, Nasal     Status: Abnormal   Collection Time: 01/02/21 11:03 AM   Specimen: Nasal Mucosa; Nasal Swab  Result Value Ref Range Status   MRSA by PCR Next Gen (A) NOT DETECTED Final    INVALID, UNABLE TO DETERMINE THE PRESENCE OF TARGET DUE TO SPECIMEN INTEGRITY. RECOLLECTION REQUESTED.    Comment:        The GeneXpert MRSA Assay (FDA approved for NASAL specimens only), is one component of a comprehensive MRSA colonization surveillance program. It is not intended to diagnose MRSA infection nor to guide or monitor treatment for MRSA infections. Performed at [facility], [place] [address] [place], [place] [address]   MRSA Next Gen by PCR, Nasal     Status: None   Collection Time: 01/03/21  3:08 AM   Specimen: Nasal Mucosa; Nasal Swab  Result Value Ref Range Status   MRSA by PCR Next Gen NOT DETECTED NOT DETECTED Final    Comment: (NOTE) The GeneXpert MRSA Assay (FDA approved for NASAL specimens only), is one component of a comprehensive MRSA colonization surveillance program. It is not intended to diagnose MRSA infection nor to guide or monitor treatment for MRSA infections. Test performance is not FDA approved in patients less than 60 years old. Performed at  [facility], [place] [address], [place], [place] [address]   Urine Culture     Status: Abnormal    Collection Time: 01/14/21  8:46 AM   Specimen: In/Out Cath Urine  Result Value Ref Range Status   Specimen Description IN/OUT CATH URINE  Final   Special Requests   Final    NONE Performed at [facility], [place] [address] [place], [place] [address]    Culture MULTIPLE SPECIES PRESENT, SUGGEST RECOLLECTION (A)  Final   Report Status 01/15/2021 FINAL  Final  Culture, blood (Routine X 2) w Reflex to ID Panel     Status: None   Collection Time: 01/14/21 11:21 AM   Specimen: BLOOD  Result Value Ref Range Status   Specimen Description BLOOD RIGHT ANTECUBITAL  Final   Special Requests   Final    BOTTLES DRAWN AEROBIC ONLY Blood Culture adequate volume   Culture   Final    NO GROWTH 5 DAYS Performed at [facility], [place] [address] [place], [place] [address]    Report Status 01/19/2021 FINAL  Final  Culture, blood (Routine X 2) w Reflex to ID Panel     Status: None   Collection Time: 01/14/21 11:22 AM   Specimen: BLOOD  Result Value Ref Range Status   Specimen Description BLOOD LEFT ANTECUBITAL  Final   Special Requests   Final    BOTTLES DRAWN AEROBIC ONLY Blood Culture adequate volume   Culture   Final    NO GROWTH 5 DAYS Performed at [facility], [place] [address], [place], [place] [address]    Report Status 01/19/2021 FINAL  Final  Culture, Respiratory w Gram Stain     Status: None   Collection Time: 01/14/21 11:40 AM   Specimen: Tracheal Aspirate; Respiratory  Result Value Ref Range Status   Specimen Description TRACHEAL ASPIRATE  Final   Special Requests NONE  Final   Gram Stain   Final    ABUNDANT WBC PRESENT,BOTH PMN AND MONONUCLEAR FEW SQUAMOUS EPITHELIAL CELLS PRESENT MODERATE GRAM NEGATIVE RODS FEW GRAM VARIABLE ROD Performed at [facility], [place] [address], [place], [place] [address]    Culture MODERATE PSEUDOMONAS AERUGINOSA  Final   Report Status 01/18/2021 FINAL  Final   Organism ID, Bacteria PSEUDOMONAS AERUGINOSA  Final       Susceptibility   Pseudomonas aeruginosa - MIC*    CEFTAZIDIME <=1 SENSITIVE Sensitive     CIPROFLOXACIN <=0.25 SENSITIVE Sensitive     GENTAMICIN <=1 SENSITIVE Sensitive     PIP/TAZO <=4 SENSITIVE Sensitive     * MODERATE PSEUDOMONAS AERUGINOSA    Coagulation Studies: No results for input(s): LABPROT, INR in the last 72 hours.  Urinalysis: Recent Labs    01/26/21 2030  COLORURINE YELLOW  LABSPEC >1.030*  PHURINE 5.0  GLUCOSEU NEGATIVE  HGBUR SMALL*  BILIRUBINUR NEGATIVE  KETONESUR NEGATIVE  PROTEINUR 100*  NITRITE NEGATIVE  LEUKOCYTESUR LARGE*       Imaging: CT ABDOMEN PELVIS WO CONTRAST  Result Date: 01/26/2021 CLINICAL DATA:  Abdominal abscess or infection suspected. EXAM: CT ABDOMEN AND PELVIS WITHOUT CONTRAST TECHNIQUE: Multidetector CT imaging of the abdomen and pelvis was performed following the standard protocol without IV contrast. COMPARISON:  12/27/2020 FINDINGS: Lower chest: Atelectasis or infiltration demonstrated in the lung bases. Possible pneumonia. Hepatobiliary: No focal liver abnormality is seen. No gallstones, gallbladder wall thickening, or biliary dilatation. Pancreas: Unremarkable. No pancreatic ductal dilatation or surrounding inflammatory changes. Spleen: Normal  spleen size.  No focal lesions identified. Adrenals/Urinary Tract: Cyst on the left kidney is unchanged. No hydronephrosis or hydroureter. No adrenal gland nodules. Bladder is decompressed with a Foley catheter in place. Stomach/Bowel: Enteric tube with tip in the distal stomach. Partial right hemicolectomy with diverting ileostomy in the right lower quadrant. Contrast material flows through to the stoma without evidence of obstruction. The distal colon is decompressed. No wall thickening or inflammatory changes. No mesenteric collections. Vascular/Lymphatic: Aortic atherosclerosis. No enlarged abdominal or pelvic lymph nodes. Reproductive: Prostate is unremarkable. Other: Small left inguinal hernia  containing fat. No free air or free fluid in the abdomen. Musculoskeletal: No acute or significant osseous findings. IMPRESSION: 1. No intra-abdominal abscess identified. 2. No evidence of bowel obstruction or inflammation. 3. Infiltration or atelectasis in the lung bases could indicate pneumonia. Electronically Signed   By: Lucienne Capers M.D.   On: 01/26/2021 19:03     Medications:    sodium chloride Stopped (12/20/20 0955)   sodium chloride     sodium chloride     ampicillin-sulbactam (UNASYN) IV 3 g (01/26/21 1832)   anticoagulant sodium citrate     feeding supplement (NEPRO CARB STEADY) 1,000 mL (01/26/21 1856)    amLODipine  5 mg Per Tube Daily   apixaban  5 mg Per Tube BID   vitamin C  500 mg Per Tube TID   atorvastatin  20 mg Per Tube Daily   chlorhexidine gluconate (MEDLINE KIT)  15 mL Mouth Rinse BID   Chlorhexidine Gluconate Cloth  6 each Topical Q0600   Chlorhexidine Gluconate Cloth  6 each Topical Q0600   Chlorhexidine Gluconate Cloth  6 each Topical Q0600   clonazepam  0.5 mg Per Tube BID   feeding supplement (PROSource TF)  45 mL Per Tube BID   fiber  1 packet Per Tube BID   gabapentin  100 mg Per Tube Q8H   hydrocerin   Topical BID   insulin aspart  0-15 Units Subcutaneous Q4H   insulin detemir  15 Units Subcutaneous BID   levETIRAcetam  500 mg Oral BID   loperamide HCl  1 mg Per Tube QID   mouth rinse  15 mL Mouth Rinse 10 times per day   multivitamin  1 tablet Per Tube QHS   nystatin  5 mL Per Tube QID   pantoprazole sodium  40 mg Per Tube Daily   QUEtiapine  50 mg Per Tube BID   scopolamine  1 patch Transdermal Q72H   sevelamer carbonate  2.4 g Per Tube TID WC   thiamine  100 mg Per Tube Daily   sodium chloride, sodium chloride, sodium chloride, albuterol, alteplase, clonazePAM, dextrose, glycopyrrolate, heparin, hydrALAZINE, HYDROmorphone (DILAUDID) injection, lidocaine (PF), lidocaine-prilocaine, lip balm, ondansetron (ZOFRAN) IV, oxyCODONE,  pentafluoroprop-tetrafluoroeth, sodium chloride flush  Assessment/ Plan:  1.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 CRRT initiated 7/31 and transitioned to IHD on 12/19/20.  S/p [facility] placement 12/22/20.  Continue with TTS schedule for now.  Patient is receiving dialysis 01/27/2021 -working with PT in regards to sitting in chair/recliner -possibly heading towards renal recovery? Monitor urine output closely.  We will continue to follow daily renal panel.   2.Urinary retention foley placed 9/16 -strict I/O  3.Volume overload/anasarca-improved with dialysis.  4.Sepsis/aspiration pneumonia/VDRF:aspiration pneumonia.  Completed antibiotics course.  S/p tracheostomy, however pt removed it on 01/01/21 s/p intubation.  New tracheostomy created 01/05/21 by ENT.   4.Stage IIIB  colon cancer - sp partial colectomy, colostomy. On chemoRx sp 3 cycles.   5.Right leg DVT: On Eliquis.  6.Atrial fibrillation - on amiodarone and anticoagulation.    7.Anemia of critical illness: DC'ed  IV iron as concern for infection and on abx, transfuse as needed, holding erythropoietin with active malignancy.  Monitor hemoglobin and transfuse as needed.  8. CKD-MBD; started sevelamer for hyperphosphatemia on 9/8, monitor phosphorus level.--improved   9. Disposition - poor overall prognosis.  Seen by palliative care team.  Discussion ongoing for possible transfer to [facility].  The tracheostomy should be capped before he can be accepted to [facility]    LOS: Earle @TODAY @11 :32 AM

## 2021-01-27 NOTE — Plan of Care (Signed)

## 2021-01-27 NOTE — Progress Notes (Signed)
PT Cancellation Note  Patient Details Name: Thomas [name] MRN: [id number] DOB: 1955/05/20   Cancelled Treatment:    Reason Eval/Treat Not Completed: Patient at procedure or test/unavailable (HD)   Calea Hribar B Janaki Exley 01/27/2021, 8:27 AM Bayard Males, PT Acute Rehabilitation Services Pager: [phone] Office: [phone]

## 2021-01-27 NOTE — Progress Notes (Addendum)
CSW spoke with Anderson Malta at [facility]	[place] who states there are no HD beds available at this time.  CSW spoke with Freda Munro on HD unit who states patient is currently receiving his treatment in his bed, but the bed is slightly inclined.  CSW spoke with Raquel Sarna at [facility] who states there are no HD beds available at this time.  Madilyn Fireman, MSW, LCSW Transitions of Care  Clinical Social Worker II [phone]

## 2021-01-28 DIAGNOSIS — E1100 Type 2 diabetes mellitus with hyperosmolarity without nonketotic hyperglycemic-hyperosmolar coma (NKHHC): Secondary | ICD-10-CM | POA: Diagnosis not present

## 2021-01-28 LAB — RENAL FUNCTION PANEL
Albumin: 2.6 g/dL — ABNORMAL LOW (ref 3.5–5.0)
Anion gap: 15 (ref 5–15)
BUN: 49 mg/dL — ABNORMAL HIGH (ref 8–23)
CO2: 24 mmol/L (ref 22–32)
Calcium: 8.8 mg/dL — ABNORMAL LOW (ref 8.9–10.3)
Chloride: 94 mmol/L — ABNORMAL LOW (ref 98–111)
Creatinine, Ser: 3.19 mg/dL — ABNORMAL HIGH (ref 0.61–1.24)
GFR, Estimated: 21 mL/min — ABNORMAL LOW (ref >60)
Glucose, Bld: 166 mg/dL — ABNORMAL HIGH (ref 70–99)
Phosphorus: 5.2 mg/dL — ABNORMAL HIGH (ref 2.5–4.6)
Potassium: 4.1 mmol/L (ref 3.5–5.1)
Sodium: 133 mmol/L — ABNORMAL LOW (ref 135–145)

## 2021-01-28 LAB — GLUCOSE, CAPILLARY
Glucose-Capillary: 147 mg/dL — ABNORMAL HIGH (ref 70–99)
Glucose-Capillary: 153 mg/dL — ABNORMAL HIGH (ref 70–99)
Glucose-Capillary: 166 mg/dL — ABNORMAL HIGH (ref 70–99)
Glucose-Capillary: 169 mg/dL — ABNORMAL HIGH (ref 70–99)
Glucose-Capillary: 179 mg/dL — ABNORMAL HIGH (ref 70–99)
Glucose-Capillary: 181 mg/dL — ABNORMAL HIGH (ref 70–99)
Glucose-Capillary: 184 mg/dL — ABNORMAL HIGH (ref 70–99)

## 2021-01-28 LAB — CBC
HCT: 27.3 % — ABNORMAL LOW (ref 39.0–52.0)
Hemoglobin: 9.3 g/dL — ABNORMAL LOW (ref 13.0–17.0)
MCH: 28.9 pg (ref 26.0–34.0)
MCHC: 34.1 g/dL (ref 30.0–36.0)
MCV: 84.8 fL (ref 80.0–100.0)
Platelets: 285 10*3/uL (ref 150–400)
RBC: 3.22 MIL/uL — ABNORMAL LOW (ref 4.22–5.81)
RDW: 14.8 % (ref 11.5–15.5)
WBC: 14.7 10*3/uL — ABNORMAL HIGH (ref 4.0–10.5)
nRBC: 0.3 % — ABNORMAL HIGH (ref 0.0–0.2)

## 2021-01-28 LAB — HEPATITIS B SURFACE ANTIBODY, QUANTITATIVE: Hep B S AB Quant (Post): <3.1 m[IU]/mL — ABNORMAL LOW (ref >9.9)

## 2021-01-28 LAB — MAGNESIUM: Magnesium: 2.2 mg/dL (ref 1.7–2.4)

## 2021-01-28 MED ORDER — CHLORHEXIDINE GLUCONATE CLOTH 2 % EX PADS: 6.0000 | MEDICATED_PAD | Freq: Every day | CUTANEOUS | Status: DC

## 2021-01-28 NOTE — Plan of Care (Signed)

## 2021-01-28 NOTE — Progress Notes (Addendum)
[facility] PROGRESS NOTE  Thomas [name] QPY:[id number] DOB: 05/11/1955 DOA: 11/30/2020 PCP: [name], NP  Status: Remains inpatient appropriate because:Persistent severe electrolyte disturbances, Unsafe d/c plan, and Inpatient level of care appropriate due to severity of illness  Dispo: The patient is from: Home              Anticipated d/c is to: SNF versus LTAC vs CIR              Patient currently is not medically stable to d/c.   Difficult to place patient Yes               Barriers to discharge: Currently has tracheostomy in place and is requiring dialysis but remains too weak to tolerate dialysis in chair which is a requirement for outpatient HD    Level of care: Progressive  Code Status: Full Family Communication: Patient only; wife on 9/21 DVT prophylaxis: Eliquis COVID vaccination status: Moderna 07/08/2019 and 08/11/2019 with first booster dose given 08/05/2020   HPI:  65 y.o. male with a history of colon cancer s/p resection and colostomy May 2022 s/p 3 cycles chemotherapy (last was July 2022), T2DM with neuropathy, HTN, HLD who presented to the ED 7/24 with abdominal pain found to be febrile, tachycardic, tachypneic. Lactic acid was 6.6 and persisted at 6.8 after sepsis IV fluid bolus and cefepime. He had ARF, demand ischemia, bandemia, was hyperglycemic with negative ketones, with lactic acidosis. He developed AFib with RVR in the ED, was started on diltiazem, and was admitted to the ICU for septic shock with unclear source. Sepsis was subsequently felt to be ruled out at time of admission, presentation felt more likely to be related to cepacitabine toxicity with oral mucositis, hand/foot syndrome, and diarrhea/high output colostomy. He was transferred to hospitalist service 7/27 but developed metabolic encephalopathy with hyoxia due to aspiration pneumonia on 7/30 requiring intubation and pressor support. Failed extubation with inability to clear secretions, requiring  reintubation 8/8, and tracheostomy subsequently placed 8/11. This was pulled out 8/22, replaced under bronch guidance, again removed 8/25 requiring reintubation after failed reinsertion. ENT revised tracheostomy in the OR 8/29. He has required IHD for ARF, and also had complications of RLE DVT now on eliquis, colitis thought to be ischemic 8/2 not requiring specific intervention, Pseudomonas pneumonia having completed treatment, agitated encephalopathy which has improved, anemia requiring 4u PRBCs total, and thrombocytopenia which has resolved. On 9/4 he was transferred to hospitalist service having tolerated trach collar.  Subjective: States abdominal pain improved.  He reports he feels like the trach is influencing his swallowing.  He is hopeful the trach will come out soon.   Objective: Vitals:   01/28/21 0400 01/28/21 0500  BP: (!) 144/70   Pulse: (!) 102 100  Resp: 18 18  Temp: 98.7 F (37.1 C)   SpO2: 95% 100%    Intake/Output Summary (Last 24 hours) at 01/28/2021 0731 Last data filed at 01/28/2021 0500 Gross per 24 hour  Intake 1895 ml  Output 3125 ml  Net -1230 ml   Filed Weights   01/27/21 0454 01/27/21 0833 01/27/21 1200  Weight: 103 kg 102 kg 100 kg    Exam: Constitutional: NAD, calm, uncomfortable secondary to abdominal pain Respiratory: #6 XLT uncuffed trach 9/9; 28% trach collar, clear to auscultation.  Lungs clear to auscultation, no secretions noted around trach.  Patient able to phonate around trach.  Marked decrease in secretions with Robinul Cardiovascular: S1-S2, minimal peripheral edema, no JVD, regular pulse, normotensive  Abdomen: Soft and focally tender lower abdomen and suprapubic region, hypoactive bowel sounds positive. LBM 9/19; colostomy with liquid stool; cortrack for tube feedings and medication Genitourinary: Fully catheter in place with dark yellow urine noted in drainage bag Neurologic: CN 2-12 grossly intact. Sensation intact, Strength 3/5 x all 4  extremities.  Psychiatric: Appears to have normal judgment and insight. Alert and oriented x 3.    Assessment/Plan: Acute problems:  Acute hypoxic respiratory failure s/p tracheostomy due to aspiration pneumonia  Recurrent pansensitive Pseudomonas tracheobronchitis -Tolerating PMV.  Trach team wants to make sure patient more mobile with stronger vocalization before trying capping trials. -Has Completed 10 days of Fortaz -Robinul added for secretions -ENT changed to cuffless trach on 9/9 -SLP following for PMV training.  Phonation improving but remains with decreased vocal intensity.  Secretions better controlled with Robinul.  Patient was able to expectorate some upper airway sputum with use of the Yankauer.  Acute abdominal pain 2/2 UTI -Labs with leukocytosis and elevated CRP but CT abdomen unremarkable for any acute abnormality -Urinalysis abnormal and consistent with UTI.  Urine culture pending -Was started empirically on IV Unasyn to treat both genitourinary and intra-abdominal sources of infection.  Oral Candida -Completed Diflucan x 6 doses total   Acute renal failure on stage IIIb CKD: Presumed ischemic ATN -started CRRT 7/31 > iHD 8/12 s/p left IJ [facility] 8/15.   - Continue HD TTS thru L IJ TDC per nephrology.  Patient needs to tolerate sitting up in chair for dialysis treatments --Improved urinary output over the past several days that is much as 1450 out on 9/16 -Continue OOB to chair TID-as he improves with mobility with PT will eventually do a trial run of chair dialysis to replicate outpatient requirements -Initially not expected to recover renal function -nephrology now documenting that he may be headed towards renal recovery.  Of note over the past 24 hours he has had a total of 575 cc urine out but this is in context of having dialysis on the same day with UF 2000 cc   High output ostomy, diarrhea:  -Negative C. diff PCR (7/26) and GI pathogen panel (8/1). Possibly ischemic  colitis on CT 8/2-need to maintain blood pressure during dialysis treatment -9/13 Imodium dose increased with decrease in liquid stool volume --Continue fiber supplementation -Will DC PPI twice daily since this class of drug can promote diarrhea   Anemia of critical illness, acute blood loss as well as anemia of chronic disease and iron deficiency:  - s/p 4u PRBCs this hospitalization  - IV iron TTS x8 doses per nephrology. 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 Benay Spice. Started CAPOX June 2022, reduced cepecitabine dose starting 11/19/2020 due to hand/foot syndrome, reduced oxaliplatin due to renal impairment and poor performance status.  - Dr. Benay Spice has followed during this hospitalization. Per note 8/2 "[pt] is undergoing curative therapy for colon cancer..." - Palliative care has been involved during this hospitalization, though goals remain clear: full aggressive measures desired.    Moderate protein calorie malnutrition/dysphagia - Continue tube feeds via cortrack-SLP following.  Patient reports difficulty swallowing secondary to trach. -Continue MVM, prostat per RD -Tolerating PMV and able to phonate.  Secretions decreased after the addition of Robinul -9/12: MBSS: oral phase dysphagia characterized by oral holding, reduced bolus cohesion, impaired bolus propulsion and impaired mastication.  Recommendation is to continue n.p.o. 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  OT/PT note that if patient continues to make progress likely would be a candidate for CIR   Acute extensive right leg DVT:  Involving CFV, SF junction, proximal femoral and profunda  veins and popliteal veins. Dx 8/5.  Did not have significant edema therefore did not require thrombolysis - Continue eliquis   New onset atrial fibrillation with RVR:  -Currently maintaining sinus rhythm w/o meds - Continue eliquis as above   Seizure disorder:  - Continue keppra   Hand/foot syndrome related to xeloda:  -Dose reduced in the outpatient setting and given acuity of current symptoms this medication has not been utilized during hospitalization - No infection noted at this time. Will continue moisturizing topical Tx   Acute metabolic encephalopathy, agitated delirium:  -Resolved - Continue clonazepam 0.31m per tube BID and prn-taper and DC as tolerated - Continue quetiapine 55mBID-taper and DC as tolerated   Uncontrolled T2DM on OHAs prior to admission with associated neuropathy:  HbA1c 8.7% at admission. - CBGs well-controlled currently, continue levemir 15u BID and SSI q4h.  - Continue gabapentin 10048m8h.   HTN:  - Continue norvasc   HLD:  - Continue atorvastatin   Thrombocytopenia:  -Transient likely due to critical illness. HIT Ab negative on 8/5.    Stress ulcer prophylaxis -Continue PPI but consider discontinuing since this medication can contribute to loose stools and diarrhea   ?Sepsis - initially thought to be present on admission- has been ruled out.     Data Reviewed: Basic Metabolic Panel: Recent Labs  Lab 01/24/21 0122 01/25/21 0155 01/26/21 0633 01/27/21 0500 01/28/21 0247  NA 132* 135 132* 131* 133*  K 4.2 4.0 3.6 3.4* 4.1  CL 89* 91* 89* 88* 94*  CO2 18* 23 22 23 24   GLUCOSE 154* 147* 219* 179* 166*  BUN 89* 50* 77* 94* 49*  CREATININE 3.87* 3.05* 4.04* 4.52* 3.19*  CALCIUM 10.0 9.0 9.3 9.4 8.8*  MG 2.4  --   --   --  2.2  PHOS 10.8* 6.9* 8.9* 9.8* 5.2*   Liver Function Tests: Recent Labs  Lab 01/24/21 0122 01/25/21 0155 01/26/21 0633 01/27/21 0500 01/28/21 0247  ALBUMIN 2.7* 2.8* 2.8* 2.5* 2.6*    CBC: Recent Labs   Lab 01/23/21 0453 01/24/21 0122 01/26/21 1222 01/27/21 0734 01/28/21 0247  WBC 15.7* 15.5* 18.5* 14.3* 14.7*  NEUTROABS  --   --  14.9*  --   --   HGB 8.7* 9.3* 9.1* 8.4* 9.3*  HCT 26.6* 26.9* 26.8* 24.0* 27.3*  MCV 86.9 84.6 84.8 83.6 84.8  PLT 244 241 319 301 285    CBG: Recent Labs  Lab 01/27/21 0718 01/27/21 1247 01/27/21 1536 01/27/21 2016 01/28/21 0404  GLUCAP 171* 196* 179* 160* 184*    Scheduled Meds:  amLODipine  5 mg Per Tube Daily   apixaban  5 mg Per Tube BID   vitamin C  500 mg Per Tube TID   atorvastatin  20 mg Per Tube Daily   chlorhexidine gluconate (MEDLINE KIT)  15 mL Mouth Rinse BID   Chlorhexidine Gluconate Cloth  6 each Topical Q0600   Chlorhexidine Gluconate Cloth  6 each Topical Q0600   Chlorhexidine Gluconate Cloth  6 each Topical Q0600   clonazepam  0.5 mg Per Tube BID  feeding supplement (PROSource TF)  45 mL Per Tube BID   fiber  1 packet Per Tube BID   gabapentin  100 mg Per Tube Q8H   hydrocerin   Topical BID   insulin aspart  0-15 Units Subcutaneous Q4H   insulin detemir  15 Units Subcutaneous BID   levETIRAcetam  500 mg Oral BID   loperamide HCl  1 mg Per Tube QID   mouth rinse  15 mL Mouth Rinse 10 times per day   multivitamin  1 tablet Per Tube QHS   nystatin  5 mL Per Tube QID   pantoprazole sodium  40 mg Per Tube Daily   QUEtiapine  50 mg Per Tube BID   scopolamine  1 patch Transdermal Q72H   sevelamer carbonate  2.4 g Per Tube TID WC   thiamine  100 mg Per Tube Daily   Continuous Infusions:  sodium chloride Stopped (12/20/20 0955)   ampicillin-sulbactam (UNASYN) IV 3 g (01/27/21 2159)   anticoagulant sodium citrate     anticoagulant sodium citrate     anticoagulant sodium citrate     feeding supplement (NEPRO CARB STEADY) 1,000 mL (01/27/21 1548)    Principal Problem:   Diabetes mellitus with hyperosmolarity without hyperglycemic hyperosmolar nonketotic coma ([place]) Active Problems:   Lactic acidosis   Acute renal  failure superimposed on stage 2 chronic kidney disease (HCC)   Metabolic acidosis   Tachycardia   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 ([place])   Adverse effect of chemotherapy   Pressure injury of skin   Atrial fibrillation with RVR ([place])   Sepsis ([place])   Malnutrition of moderate degree   Acute metabolic encephalopathy   Acute respiratory failure with hypoxemia (HCC)   Acute renal failure with acute cortical necrosis ([place])   Shock ([place])   Status post tracheostomy ([place])   Endotracheal tube present   Hemodialysis status ([place])   Palliative care by specialist   SOB (shortness of breath)   Consultants: PCCM General surgery Nephrology Hematology/oncology Palliative care medicine team  Procedures: 2u PRBC 7/31, 1u PRBC 8/17, 1u PRBC 8/27 Tracheostomy revision 01/05/2021 Dr. Constance Holster    Antibiotics: Vancomycin 7/24 Cefepime 7/24 - 7/27 Unasyn 7/30 >> cefepime/flagyl 7/31 - 8/6     Time spent: 40 minutes    [name] ANP  [facility] 7 am - 330 pm/M-F for direct patient care and secure chat Please refer to Amion for contact info 58  days

## 2021-01-28 NOTE — Progress Notes (Signed)
Physical Therapy Treatment Patient Details Name: Thomas [name] MRN: [id number] DOB: 03/09/1956 Today's Date: 01/28/2021   History of Present Illness 65 y.o. male admitted 7/24 with high ostomy output. Pt with severe sepsis and multisystem organ failure. Hospital admission complicated by volume overload, aspiration pneumonia, acute respiratory failure, AKI and ischemic colitis. Intubated 7/30. S/p trach placement 8/11. CorTrak 8/12. HD initiated 8/12. To IR 8/15 for tunneled HD cath. Pulled out trach x2 8/22 replaced by PCCM. Pulled out trach again 8/25 but unable to be reinserted with pt intubated. 8/29 to OR for stoma clean up and trach replacement.Trach downsized 9/10.  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 (Simultaneous filing. User may not have seen previous data.)    PT Comments    Pt steadily improving toward goals.  Pt has little stamina, but mobility/stability and balance are improving each session.  Emphasis on transition, transfer technique, bedside standing activity incl stepping in place and transfer with RW and stand with pivotal steps to the chair.    Recommendations for follow up therapy are one component of a multi-disciplinary discharge planning process, led by the attending physician.  Recommendations may be updated based on patient status, additional functional criteria and insurance authorization.  Follow Up Recommendations  SNF     Equipment Recommendations  Other (comment)    Recommendations for Other Services       Precautions / Restrictions Precautions Precautions: Fall (Simultaneous filing. User may not have seen previous data.) Precaution Comments: cortrak, R subclavian tunneled HD cath, ostomy, O2 via trach collar (Simultaneous filing. User may not  have seen previous data.)     Mobility  Bed Mobility Overal bed mobility: Needs Assistance Bed Mobility: Supine to Sit Rolling: Min assist   Supine to sit: Min assist (minor  raised HOB and use of the rail)     General bed mobility comments: pt following cues and sequencing better to come up via R elbow, minimal truncal assist    Transfers Overall transfer level: Needs assistance Equipment used: Rolling walker (2 wheeled) Transfers: Sit to/from [facility] Sit to Stand: Min assist;From elevated surface;+2 safety/equipment (x4) Stand pivot transfers: Mod assist;+2 physical assistance;+2 safety/equipment       General transfer comment: pt able to take about 8 low amplitude steps to transfer to the chair.  Ambulation/Gait                 Stairs             Wheelchair Mobility    Modified Rankin (Stroke Patients Only)       Balance Overall balance assessment: Needs assistance Sitting-balance support: No upper extremity supported;Bilateral upper extremity supported;Feet supported Sitting balance-Leahy Scale: Good       Standing balance-Leahy Scale: Poor Standing balance comment: Reliant on UE support and physical A.  4 trials of sit to stand and 3 of the trials incl w/shifts, unweighting and steps in place.                            Cognition Arousal/Alertness: Awake/alert (Simultaneous filing. User may not have seen previous data.) Behavior During Therapy: [facility] for tasks assessed/performed;Anxious (Simultaneous filing. User may not have seen previous data.) Overall Cognitive Status: Within Functional Limits for tasks assessed (Simultaneous filing. User may not  have seen previous data.) Area of Impairment: Problem solving                             Problem Solving: Slow processing;Requires verbal cues General Comments: vcs to perform tasks correctly (Simultaneous filing. User may not have seen previous data.)       Exercises General Exercises - Upper Extremity Theraband Level (Elbow Flexion): Level 1 (Yellow) Theraband Level (Elbow Extension): Level 1 (Yellow) Other Exercises Other Exercises: bil hip/knee flex/ext with graded assist/resistance x10 reps    General Comments General comments (skin integrity, edema, etc.): HR rose to upper 110's and sats on RA or 28% TC at upper 90's      Pertinent Vitals/Pain Pain Assessment: Faces (Simultaneous filing. User may not have seen previous data.) Faces Pain Scale: Hurts little more (Simultaneous filing. User may not have seen previous data.) Pain Location: left side (Simultaneous filing. User may not have seen previous data.) Pain Descriptors / Indicators: Aching;Moaning;Grimacing (Simultaneous filing. User may not have seen previous data.) Pain Intervention(s): Monitored during session;Repositioned (Simultaneous filing. User may not have seen previous data.)    Home Living                      Prior Function            PT Goals (current goals can now be found in the care plan section) Acute Rehab PT Goals Patient Stated Goal: to get out of here and get home. PT Goal Formulation: With patient Time For Goal Achievement: 02/04/21 Potential to Achieve Goals: Good Progress towards PT goals: Progressing toward goals    Frequency    Min 3X/week      PT Plan Current plan remains appropriate    Co-evaluation PT/OT/SLP Co-Evaluation/Treatment: Yes Reason for Co-Treatment: Complexity of the patient's impairments (multi-system involvement);For patient/therapist safety (Simultaneous filing. User may not have seen previous data.) PT goals addressed during session: Mobility/safety with mobility;Strengthening/ROM OT goals addressed during session: ADL's and self-care (Simultaneous filing. User may not have seen previous data.)      AM-PAC PT "6 Clicks" Mobility   Outcome Measure  Help needed turning from your back to your side while  in a flat bed without using bedrails?: A Little Help needed moving from lying on your back to sitting on the side of a flat bed without using bedrails?: A Little Help needed moving to and from a bed to a chair (including a wheelchair)?: A Lot Help needed standing up from a chair using your arms (e.g., wheelchair or bedside chair)?: A Little Help needed to walk in hospital room?: A Lot Help needed climbing 3-5 steps with a railing? : Total 6 Click Score: 14    End of Session   Activity Tolerance: Patient tolerated treatment well;Patient limited by fatigue;Patient limited by pain Patient left: in chair;with call bell/phone within reach Nurse Communication: Mobility status PT Visit Diagnosis: Muscle weakness (generalized) (M62.81);Other abnormalities of gait and mobility (R26.89)     Time: 1435-1500 PT Time Calculation (min) (ACUTE ONLY): 25 min  Charges:  $Therapeutic Activity: 8-22 mins                     01/28/2021  Ginger Carne., PT Acute Rehabilitation Services [phone]  (pager) [phone]  (office)   Thomas [name] 01/28/2021, 3:12 PM

## 2021-01-28 NOTE — Progress Notes (Signed)
Occupational Therapy Treatment Patient Details Name: Thomas [name] MRN: [id number] DOB: [birth year] Today's Date: 01/28/2021   History of present illness 65 y.o. male admitted 7/24 with high ostomy output. Pt with severe sepsis and multisystem organ failure. Hospital admission complicated by volume overload, aspiration pneumonia, acute respiratory failure, AKI and ischemic colitis. Intubated 7/30. S/p trach placement 8/11. CorTrak 8/12. HD initiated 8/12. To IR 8/15 for tunneled HD cath. Pulled out trach x2 8/22 replaced by PCCM. Pulled out trach again 8/25 but unable to be reinserted with pt intubated. 8/29 to OR for stoma clean up and trach replacement.Trach downsized 9/10.  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 (Simultaneous filing. User may not have seen previous data.)   OT comments  Patient requiring less assistance for supine to sitting on eob and improved sitting balance. Patient performed several stands from eob with rw and was able to transfer with rw to recliner.  Setup for grooming with assistance to clear mouth.  Acute OT to continue to follow.    Recommendations for follow up therapy are one component of a multi-disciplinary discharge planning process, led by the attending physician.  Recommendations may be updated based on patient status, additional functional criteria and insurance authorization.    Follow Up Recommendations  SNF;Supervision/Assistance - 24 hour    Equipment Recommendations       Recommendations for Other Services      Precautions / Restrictions Precautions Precautions: Fall (Simultaneous filing. User may not have seen previous data.) Precaution Comments: cortrak, R subclavian tunneled HD cath, ostomy, O2 via trach collar (Simultaneous filing. User  may not have seen previous data.)       Mobility Bed Mobility Overal bed mobility: Needs Assistance Bed Mobility: Supine to Sit Rolling: Min assist   Supine to sit: Min assist (minor  raised HOB and use of the rail)     General bed mobility comments: pt following cues and sequencing better to come up via R elbow, minimal truncal assist    Transfers Overall transfer level: Needs assistance Equipment used: Rolling walker (2 wheeled) Transfers: Sit to/from [facility] Sit to Stand: Min assist;From elevated surface;+2 safety/equipment (x4) Stand pivot transfers: Mod assist;+2 physical assistance;+2 safety/equipment       General transfer comment: pt able to take about 8 low amplitude steps to transfer to the chair.    Balance Overall balance assessment: Needs assistance Sitting-balance support: No upper extremity supported;Bilateral upper extremity supported;Feet supported Sitting balance-Leahy Scale: Good       Standing balance-Leahy Scale: Poor Standing balance comment: Reliant on UE support and physical A.  4 trials of sit to stand and 3 of the trials incl w/shifts, unweighting and steps in place.                           ADL either performed or assessed with clinical judgement   ADL Overall ADL's : Needs assistance/impaired     Grooming: Wash/dry hands;Wash/dry face;Oral care;Sitting Grooming Details (indicate cue type and reason): required setup and vcs for sequencing                               General ADL Comments: vcs for sequencening     Vision  Perception     Praxis      Cognition Arousal/Alertness: Awake/alert (Simultaneous filing. User may not have seen previous data.) Behavior During Therapy: [facility] for tasks assessed/performed;Anxious (Simultaneous filing. User may not have seen previous data.) Overall Cognitive Status: Within Functional Limits for tasks assessed (Simultaneous filing. User may not have  seen previous data.) Area of Impairment: Problem solving                             Problem Solving: Slow processing;Requires verbal cues General Comments: vcs to perform tasks correctly (Simultaneous filing. User may not have seen previous data.)        Exercises Exercises: General Upper Extremity General Exercises - Upper Extremity Shoulder Flexion: AROM;Both;Supine;10 reps Elbow Flexion: Strengthening;Both;10 reps;Supine;Theraband Theraband Level (Elbow Flexion): Level 1 (Yellow) Elbow Extension: Strengthening;Both;10 reps;Supine;Theraband Theraband Level (Elbow Extension): Level 1 (Yellow) Other Exercises Other Exercises: bil hip/knee flex/ext with graded assist/resistance x10 reps   Shoulder Instructions       General Comments HR rose to upper 110's and sats on RA or 28% TC at upper 90's    Pertinent Vitals/ Pain       Pain Assessment: Faces (Simultaneous filing. User may not have seen previous data.) Faces Pain Scale: Hurts little more (Simultaneous filing. User may not have seen previous data.) Pain Location: left side (Simultaneous filing. User may not have seen previous data.) Pain Descriptors / Indicators: Aching;Moaning;Grimacing (Simultaneous filing. User may not have seen previous data.) Pain Intervention(s): Monitored during session;Repositioned (Simultaneous filing. User may not have seen previous data.)  Home Living                                          Prior Functioning/Environment              Frequency  Min 2X/week        Progress Toward Goals  OT Goals(current goals can now be found in the care plan section)  Progress towards OT goals: Progressing toward goals  Acute Rehab OT Goals Patient Stated Goal: to get out of here and get home. OT Goal Formulation: With patient Time For Goal Achievement: 02/06/21 Potential to Achieve Goals: Good ADL Goals Pt Will Perform Grooming: with min guard assist;standing Pt  Will Perform Upper Body Dressing: with min assist;sitting Pt Will Perform Lower Body Dressing: with mod assist;sit to/from stand Pt Will Transfer to Toilet: with supervision;ambulating;bedside commode Pt Will Perform Toileting - Clothing Manipulation and hygiene: with supervision;sit to/from stand Pt Will Perform Tub/Shower Transfer: Tub transfer;Shower transfer;3 in Energy manager will Perform Home Exercise Program: Increased ROM;Increased strength;Right Upper extremity;Left upper extremity;With written HEP provided;Independently Additional ADL Goal #1: Pt will demosntrate increased activity tolerance to perform two standing groomign tasks at sink with Min Guard A Additional ADL Goal #2: Patinet will complete sit to stand transfers with Mod A +2 in prep for ADLs. Additional ADL Goal #3: Patient will attend to 2/3 grooming tasks without cues for continuation indicating increased attention to task.  Plan Discharge plan remains appropriate    Co-evaluation    PT/OT/SLP Co-Evaluation/Treatment: Yes Reason for Co-Treatment: Complexity of the patient's impairments (multi-system involvement);For patient/therapist safety (Simultaneous filing. User may not have seen previous data.) PT goals addressed during session: Mobility/safety with mobility;Strengthening/ROM OT goals addressed during session: ADL's and self-care (Simultaneous filing. User may not have seen  previous data.)      AM-PAC OT "6 Clicks" Daily Activity     Outcome Measure   Help from another person eating meals?: Total Help from another person taking care of personal grooming?: A Little Help from another person toileting, which includes using toliet, bedpan, or urinal?: A Lot Help from another person bathing (including washing, rinsing, drying)?: A Lot Help from another person to put on and taking off regular upper body clothing?: Total Help from another person to put on and taking off regular lower body clothing?:  Total 6 Click Score: 10    End of Session Equipment Utilized During Treatment: Gait belt;Oxygen  OT Visit Diagnosis: Unsteadiness on feet (R26.81);Muscle weakness (generalized) (M62.81);Pain Pain - Right/Left: Left Pain - part of body:  (abdomen)   Activity Tolerance Patient tolerated treatment well   Patient Left in chair;with call bell/phone within reach;with chair alarm set   Nurse Communication Mobility status        Time: 1435-1500 OT Time Calculation (min): 25 min  Charges: OT General Charges $OT Visit: 1 Visit OT Treatments $Self Care/Home Management : 8-22 mins  Lodema Hong, OTA   Jayni Prescher Alexis Goodell 01/28/2021, 3:21 PM

## 2021-01-28 NOTE — Progress Notes (Addendum)
Inpatient Rehabilitation Admissions Coordinator  Patient not yet at a level to pursue Cir admit with [facility]. I continue to follow his progress from a distance. I would encourage Nursing to also coordinate with therapy and dialysis to get patient up in chair for progressively more time every day to increase tolerance sitting up in chair. Noted Dr Loanne Drilling on 9/19 felt secretions prevents downsizing trach or capping trials.  Danne Baxter, RN, MSN Rehab Admissions Coordinator [phone] 01/28/2021 8:52 PM

## 2021-01-28 NOTE — Progress Notes (Signed)
Nutrition Follow-up  DOCUMENTATION CODES:   Non-severe (moderate) malnutrition in context of chronic illness, Obesity unspecified  INTERVENTION:   Continue TF via Cortrak: -Nepro 1.8 @ 77m/hr (13219md) -4597mrosource TF BID   Provides 2456 kcals, 128 grams protein, 959m16mee water   - Continue renal MVI daily per tube - Continue NutriSource Fiber BID per tube  - If pt unable to make progress toward diet advancement, recommend considering longer term enteral access (i.e. PEG)  NUTRITION DIAGNOSIS:   Moderate Malnutrition related to chronic illness, cancer and cancer related treatments as evidenced by mild fat depletion, mild muscle depletion, percent weight loss.  Ongoing  GOAL:   Patient will meet greater than or equal to 90% of their needs  Met with TF  MONITOR:   Diet advancement, Labs, Weight trends, TF tolerance, Skin, I & O's  REASON FOR ASSESSMENT:   Consult Enteral/tube feeding initiation and management  ASSESSMENT:   65 y41. male who is a former with medical history of stage 3 colon cancer s/p partial colectomy and end colostomy on 5/4, recent chemo start with 3rd cycle on 7/22, sleep apnea, seizures, epilepsy, brain tumor, HTN, HLD, GERD, asthma, peripheral neuropathy, urinary frequency, DM, MCA stroke. He presented to the ED via EMS due to abdominal pain x2 weeks, anorexia, and weakness. Admitted for close monitoring for concern of mesenteric ischemia. Patient reported that abdominal pain was worse with eating PTA.  7/31 - CRRT initiated 8/09 - CRRT stopped  8/10 - transfer to [facility] trach placement and iHD 8/11 - s/p trach 8/12 - Cortrak placed (tip of tube in stomach) 8/15 - s/p placement of TDC [facility] - pulled trach out, replaced under bronch guidance, s/p bronch revealing mild area of granulation tissue of top of trach tube 8/25 - pulled trach out, transferred to ICU, emergently intubated 8/29 - trach replaced with ENT in OR 9/04 - tx out of  ICU 9/09 - changed to cuffless trach  Reviewed I/O's: -1.2 L x 24 hours and -11.4 L since 01/14/21  UOP: 575 ml x 24 hours  Colostomy output: 550 ml x 24 hours  Pt on trach collae.   Per nephrology notes, last HD on 01/27/21. Pt may be possibly heading towards renal recovery; plan close UOP monitoring and daily renal panel.   Spoke with pt at bedside, who reports he wants to eat. RD discussed rationale for cortrak and TF. He denies nausea, vomiting, and abdominal pain. Noted Nepro infusing via cotrak tube at 55 ml/hr.   Per SLP notes, PSMV and PO trials continue. Pt to remain NPO.   Per TOC notes[facility] and include vitamin C, keppra, renvela, and thiamine.   Labs reviewed: Na: 133, Phos: 5.2, CBGS: 160-184 (inpatient orders for glycemic control are 0-15 units insulin aspart every 4 hours and 15 units insulin detemir BID).    Diet Order:   Diet Order             Diet NPO time specified  Diet effective midnight                   EDUCATION NEEDS:   No education needs have been identified at this time  Skin:  Skin Assessment: Skin Integrity Issues: Skin Integrity Issues:: Stage II, DTI, Other (Comment), Incisions DTI: upper R and L face Stage II: coccyx, R back, L buttocks Incisions: throat (trach) Other: sloughing to L hand due to hand-foot-and-mouth disease, R thigh  non-pressure friction wound  Last BM:  01/27/21 (400 ml via colostomy)  Height:   Ht Readings from Last 1 Encounters:  01/01/21 5' 8"  (1.727 m)    Weight:   Wt Readings from Last 1 Encounters:  01/27/21 100 kg    Ideal Body Weight:  70 kg  BMI:  Body mass index is 33.52 kg/m.  Estimated Nutritional Needs:   Kcal:  9432-7614  Protein:  120-140 gm  Fluid:  1 L + UOP    Thomas [name], RD, LDN, CDCES Registered Dietitian II Certified Diabetes Care and Education Specialist Please refer to [facility] for RD and/or RD on-call/weekend/after hours  pager

## 2021-01-28 NOTE — Progress Notes (Signed)
[facility] ROUNDING NOTE   Subjective:   Interval History: 65 year old gentleman with a history of colon cancer status postresection and colostomy May 2022.  Status post 3 cycles of chemotherapy last was in July 2022.  Type 2 diabetes with neuropathy hypertension hyperlipidemia presented to the emergency room 11/30/2020 with abdominal pain.  Was found to be secondary to sepsis.  Patient also was found to have acute kidney injury.  Developed atrial fibrillation with rapid ventricular rate and was admitted to intensive care unit for septic shock.  On 12/03/2020 he developed metabolic encephalopathy with hypoxia and aspiration pneumonia and required intubation 12/06/2020.  He also required pressor support.  He was reintubated since 12/15/2020 tracheostomy placed.  His hospital course was complicated by DVT.  It appears that he has recurrent Pseudomonas pneumonia.  He also has recurrent anemia requiring transfusion of 4 units of packed red blood cells.  Blood pressure 141/79 pulse 98 temperature 98.7 O2 sats 95% trach collar 28% oxygen 5 L nasal cannula  Sodium 133 potassium 4.1 chloride 94 CO2 24 BUN 49 creatinine 3.19 glucose 166 calcium 8.8 phosphorus 5.2 albumin 2.6 hemoglobin 9.3  Minimal urine output although did have 500 cc 01/27/2021.  He underwent dialysis with removal of 2 L ultrafiltration 01/27/2021.  His next dialysis treatment will be 01/29/2021  Objective:  Vital signs in last 24 hours:  Temp:  [97.9 F (36.6 C)-99.5 F (37.5 C)] 98.7 F (37.1 C) (09/21 0400) Pulse Rate:  [78-112] 100 (09/21 0500) Resp:  [16-25] 18 (09/21 0500) BP: (90-144)/(50-91) 144/70 (09/21 0400) SpO2:  [95 %-100 %] 100 % (09/21 0500) FiO2 (%):  [28 %-35 %] 28 % (09/21 0500) Weight:  [100 kg-102 kg] 100 kg (09/20 1200)  Weight change: -1 kg Filed Weights   01/27/21 0454 01/27/21 0833 01/27/21 1200  Weight: 103 kg 102 kg 100 kg    Intake/Output: I/O last 3 completed shifts: In: 2604.2  [NG/GT:2103.7; IV Piggyback:500.6] Out: 3125 [Urine:575; Other:2000; Stool:550]   Intake/Output this shift:  No intake/output data recorded.  General: Ill-looking male, with tracheostomy on trach collar, not in distress Heart:RRR, s1s2 nl Lungs: normal wob Abdomen:soft, Non-tender, non-distended Extremities: No edema. Neurology: Alert awake and following commands. Dialysis Access: [facility] in place.    Basic Metabolic Panel: Recent Labs  Lab 01/24/21 0122 01/25/21 0155 01/26/21 0633 01/27/21 0500 01/28/21 0247  NA 132* 135 132* 131* 133*  K 4.2 4.0 3.6 3.4* 4.1  CL 89* 91* 89* 88* 94*  CO2 18* 23 22 23 24   GLUCOSE 154* 147* 219* 179* 166*  BUN 89* 50* 77* 94* 49*  CREATININE 3.87* 3.05* 4.04* 4.52* 3.19*  CALCIUM 10.0 9.0 9.3 9.4 8.8*  MG 2.4  --   --   --  2.2  PHOS 10.8* 6.9* 8.9* 9.8* 5.2*     Liver Function Tests: Recent Labs  Lab 01/24/21 0122 01/25/21 0155 01/26/21 0633 01/27/21 0500 01/28/21 0247  ALBUMIN 2.7* 2.8* 2.8* 2.5* 2.6*    No results for input(s): LIPASE, AMYLASE in the last 168 hours. No results for input(s): AMMONIA in the last 168 hours.  CBC: Recent Labs  Lab 01/23/21 0453 01/24/21 0122 01/26/21 1222 01/27/21 0734 01/28/21 0247  WBC 15.7* 15.5* 18.5* 14.3* 14.7*  NEUTROABS  --   --  14.9*  --   --   HGB 8.7* 9.3* 9.1* 8.4* 9.3*  HCT 26.6* 26.9* 26.8* 24.0* 27.3*  MCV 86.9 84.6 84.8 83.6 84.8  PLT 244 241 319 301 285  Cardiac Enzymes: No results for input(s): CKTOTAL, CKMB, CKMBINDEX, TROPONINI in the last 168 hours.  BNP: Invalid input(s): POCBNP  CBG: Recent Labs  Lab 01/27/21 1247 01/27/21 1536 01/27/21 2016 01/28/21 0404 01/28/21 0803  GLUCAP 196* 179* 160* 184* 181*     Microbiology: Results for orders placed or performed during the hospital encounter of 11/30/20  Resp Panel by RT-PCR (Flu A&B, Covid) Nasopharyngeal Swab     Status: None   Collection Time: 11/30/20 10:05 PM   Specimen: Nasopharyngeal  Swab; Nasopharyngeal(NP) swabs in vial transport medium  Result Value Ref Range Status   SARS Coronavirus 2 by RT PCR NEGATIVE NEGATIVE Final    Comment: (NOTE) SARS-CoV-2 target nucleic acids are NOT DETECTED.  The SARS-CoV-2 RNA is generally detectable in upper respiratory specimens during the acute phase of infection. The lowest concentration of SARS-CoV-2 viral copies this assay can detect is 138 copies/mL. A negative result does not preclude SARS-Cov-2 infection and should not be used as the sole basis for treatment or other patient management decisions. A negative result may occur with  improper specimen collection/handling, submission of specimen other than nasopharyngeal swab, presence of viral mutation(s) within the areas targeted by this assay, and inadequate number of viral copies(<138 copies/mL). A negative result must be combined with clinical observations, patient history, and epidemiological information. The expected result is Negative.  Fact Sheet for Patients:  EntrepreneurPulse.com.au  Fact Sheet for Healthcare Providers:  IncredibleEmployment.be  This test is no t yet approved or cleared by the [place] FDA and  has been authorized for detection and/or diagnosis of SARS-CoV-2 by FDA under an Emergency Use Authorization (EUA). This EUA will remain  in effect (meaning this test can be used) for the duration of the COVID-19 declaration under Section 564(b)(1) of the Act, 21 U.S.C.section 360bbb-3(b)(1), unless the authorization is terminated  or revoked sooner.       Influenza A by PCR NEGATIVE NEGATIVE Final   Influenza B by PCR NEGATIVE NEGATIVE Final    Comment: (NOTE) The Xpert Xpress SARS-CoV-2/FLU/RSV plus assay is intended as an aid in the diagnosis of influenza from Nasopharyngeal swab specimens and should not be used as a sole basis for treatment. Nasal washings and aspirates are unacceptable for Xpert Xpress  SARS-CoV-2/FLU/RSV testing.  Fact Sheet for Patients: EntrepreneurPulse.com.au  Fact Sheet for Healthcare Providers: IncredibleEmployment.be  This test is not yet approved or cleared by the [place] FDA and has been authorized for detection and/or diagnosis of SARS-CoV-2 by FDA under an Emergency Use Authorization (EUA). This EUA will remain in effect (meaning this test can be used) for the duration of the COVID-19 declaration under Section 564(b)(1) of the Act, 21 U.S.C. section 360bbb-3(b)(1), unless the authorization is terminated or revoked.  Performed at [facility], [place] [address] [place], [place] [address]   Blood Culture (routine x 2)     Status: None   Collection Time: 11/30/20 10:05 PM   Specimen: BLOOD  Result Value Ref Range Status   Specimen Description   Final    BLOOD LEFT ANTECUBITAL Performed at [facility] [address] [place], [place] [address]    Special Requests   Final    BOTTLES DRAWN AEROBIC AND ANAEROBIC Blood Culture results may not be optimal due to an inadequate volume of blood received in culture bottles Performed at [place] [address] [place], [place] [address]    Culture   Final    NO GROWTH  5 DAYS Performed at [facility], [place] [address], [place], [place] [address]    Report Status 12/06/2020 FINAL  Final  Blood Culture (routine x 2)     Status: None   Collection Time: 11/30/20 10:33 PM   Specimen: BLOOD  Result Value Ref Range Status   Specimen Description   Final    BLOOD RIGHT ANTECUBITAL Performed at [facility] [address], [place], [place] [address]    Special Requests   Final    BOTTLES DRAWN AEROBIC AND ANAEROBIC Blood Culture adequate volume Performed at [place] [address] [employer] [place], [place] [address]    Culture   Final    NO GROWTH 5 DAYS Performed at  [facility], [place] [address], [place], [place] [address]    Report Status 12/06/2020 FINAL  Final  Urine Culture     Status: None   Collection Time: 12/01/20  2:00 AM   Specimen: In/Out Cath Urine  Result Value Ref Range Status   Specimen Description   Final    IN/OUT CATH URINE Performed at [facility] [address] [place], [place] [address]    Special Requests   Final    NONE Performed at [facility], [place] [address] [place], [place] [address]    Culture   Final    NO GROWTH Performed at [facility], [place] [address], [place], [place] [address]    Report Status 12/02/2020 FINAL  Final  MRSA Next Gen by PCR, Nasal     Status: None   Collection Time: 12/01/20  8:28 AM   Specimen: Nasal Mucosa; Nasal Swab  Result Value Ref Range Status   MRSA by PCR Next Gen NOT DETECTED NOT DETECTED Final    Comment: (NOTE) The GeneXpert MRSA Assay (FDA approved for NASAL specimens only), is one component of a comprehensive MRSA colonization surveillance program. It is not intended to diagnose MRSA infection nor to guide or monitor treatment for MRSA infections. Test performance is not FDA approved in patients less than 39 years old. Performed at [facility], [place] [address], [place], [place] [address]   C Difficile Quick Screen w PCR reflex     Status: None   Collection Time: 12/02/20  4:33 PM   Specimen: STOOL  Result Value Ref Range Status   C Diff antigen NEGATIVE NEGATIVE Final   C Diff toxin NEGATIVE NEGATIVE Final   C Diff interpretation No C. difficile detected.  Final    Comment: Performed at [facility], [place] [address], [place], [place] [address]  Culture, Respiratory w Gram Stain     Status: None   Collection Time: 12/07/20  8:14 AM   Specimen: Tracheal Aspirate; Respiratory  Result Value Ref Range Status   Specimen Description   Final    TRACHEAL ASPIRATE Performed at [facility] [address] [place], [place] [address]    Special Requests   Final    NONE Performed at [facility], [place] [address] [place], [place] [address]    Gram Stain   Final    FEW WBC PRESENT,BOTH PMN AND MONONUCLEAR ABUNDANT YEAST FEW GRAM POSITIVE RODS Performed at [facility], [place] [address] [place], [place] [address]    Culture MODERATE CANDIDA TROPICALIS  Final   Report Status 12/09/2020 FINAL  Final  Gastrointestinal Panel by PCR , Stool  Status: None   Collection Time: 12/08/20  2:05 PM   Specimen: Stool  Result Value Ref Range Status   Campylobacter species NOT DETECTED NOT DETECTED Final   Plesimonas shigelloides NOT DETECTED NOT DETECTED Final   Salmonella species NOT DETECTED NOT DETECTED Final   Yersinia enterocolitica NOT DETECTED NOT DETECTED Final   Vibrio species NOT DETECTED NOT DETECTED Final   Vibrio cholerae NOT DETECTED NOT DETECTED Final   Enteroaggregative E coli (EAEC) NOT DETECTED NOT DETECTED Final   Enteropathogenic E coli (EPEC) NOT DETECTED NOT DETECTED Final   Enterotoxigenic E coli (ETEC) NOT DETECTED NOT DETECTED Final   Shiga like toxin producing E coli (STEC) NOT DETECTED NOT DETECTED Final   Shigella/Enteroinvasive E coli (EIEC) NOT DETECTED NOT DETECTED Final   Cryptosporidium NOT DETECTED NOT DETECTED Final   Cyclospora cayetanensis NOT DETECTED NOT DETECTED Final   Entamoeba histolytica NOT DETECTED NOT DETECTED Final   Giardia lamblia NOT DETECTED NOT DETECTED Final   Adenovirus F40/41 NOT DETECTED NOT DETECTED Final   Astrovirus NOT DETECTED NOT DETECTED Final   Norovirus GI/GII NOT DETECTED NOT DETECTED Final   Rotavirus A NOT DETECTED NOT DETECTED Final   Sapovirus (I, II, IV, and V) NOT DETECTED NOT DETECTED Final    Comment: Performed at [facility], [place] [place], [place] [address]  Culture, Respiratory w Gram Stain     Status: None   Collection Time: 12/22/20  10:13 AM   Specimen: Tracheal Aspirate; Respiratory  Result Value Ref Range Status   Specimen Description TRACHEAL ASPIRATE  Final   Special Requests NONE  Final   Gram Stain   Final    FEW SQUAMOUS EPITHELIAL CELLS PRESENT FEW WBC SEEN ABUNDANT GRAM POSITIVE COCCI FEW GRAM VARIABLE ROD Performed at [facility], [place] [address] [place], [place] [address]    Culture RARE PSEUDOMONAS AERUGINOSA  Final   Report Status 12/26/2020 FINAL  Final   Organism ID, Bacteria PSEUDOMONAS AERUGINOSA  Final      Susceptibility   Pseudomonas aeruginosa - MIC*    CEFTAZIDIME <=1 SENSITIVE Sensitive     GENTAMICIN <=1 SENSITIVE Sensitive     PIP/TAZO 8 SENSITIVE Sensitive     * RARE PSEUDOMONAS AERUGINOSA  MRSA Next Gen by PCR, Nasal     Status: Abnormal   Collection Time: 01/02/21 11:03 AM   Specimen: Nasal Mucosa; Nasal Swab  Result Value Ref Range Status   MRSA by PCR Next Gen (A) NOT DETECTED Final    INVALID, UNABLE TO DETERMINE THE PRESENCE OF TARGET DUE TO SPECIMEN INTEGRITY. RECOLLECTION REQUESTED.    Comment:        The GeneXpert MRSA Assay (FDA approved for NASAL specimens only), is one component of a comprehensive MRSA colonization surveillance program. It is not intended to diagnose MRSA infection nor to guide or monitor treatment for MRSA infections. Performed at [facility], [place] [address] [place],  [address]   MRSA Next Gen by PCR, Nasal     Status: None   Collection Time: 01/03/21  3:08 AM   Specimen: Nasal Mucosa; Nasal Swab  Result Value Ref Range Status   MRSA by PCR Next Gen NOT DETECTED NOT DETECTED Final    Comment: (NOTE) The GeneXpert MRSA Assay (FDA approved for NASAL specimens only), is one component of a comprehensive MRSA colonization surveillance program. It is not intended to diagnose MRSA infection nor to guide or monitor treatment for MRSA infections. Test performance is  not FDA approved in patients less than 44 years old. Performed  at [facility], [place] [address] [place], [place] [address]   Urine Culture     Status: Abnormal   Collection Time: 01/14/21  8:46 AM   Specimen: In/Out Cath Urine  Result Value Ref Range Status   Specimen Description IN/OUT CATH URINE  Final   Special Requests   Final    NONE Performed at [facility], [place] [address] [place], [place] [address]    Culture MULTIPLE SPECIES PRESENT, SUGGEST RECOLLECTION (A)  Final   Report Status 01/15/2021 FINAL  Final  Culture, blood (Routine X 2) w Reflex to ID Panel     Status: None   Collection Time: 01/14/21 11:21 AM   Specimen: BLOOD  Result Value Ref Range Status   Specimen Description BLOOD RIGHT ANTECUBITAL  Final   Special Requests   Final    BOTTLES DRAWN AEROBIC ONLY Blood Culture adequate volume   Culture   Final    NO GROWTH 5 DAYS Performed at [facility], [place] [address] [place], [place] [address]    Report Status 01/19/2021 FINAL  Final  Culture, blood (Routine X 2) w Reflex to ID Panel     Status: None   Collection Time: 01/14/21 11:22 AM   Specimen: BLOOD  Result Value Ref Range Status   Specimen Description BLOOD LEFT ANTECUBITAL  Final   Special Requests   Final    BOTTLES DRAWN AEROBIC ONLY Blood Culture adequate volume   Culture   Final    NO GROWTH 5 DAYS Performed at [facility], [place] [address] [place], [place] [address]    Report Status 01/19/2021 FINAL  Final  Culture, Respiratory w Gram Stain     Status: None   Collection Time: 01/14/21 11:40 AM   Specimen: Tracheal Aspirate; Respiratory  Result Value Ref Range Status   Specimen Description TRACHEAL ASPIRATE  Final   Special Requests NONE  Final   Gram Stain   Final    ABUNDANT WBC PRESENT,BOTH PMN AND MONONUCLEAR FEW SQUAMOUS EPITHELIAL CELLS PRESENT MODERATE GRAM NEGATIVE RODS FEW GRAM VARIABLE ROD Performed at [facility], [place] [address] [place], [place] [address]    Culture MODERATE PSEUDOMONAS AERUGINOSA  Final    Report Status 01/18/2021 FINAL  Final   Organism ID, Bacteria PSEUDOMONAS AERUGINOSA  Final      Susceptibility   Pseudomonas aeruginosa - MIC*    CEFTAZIDIME <=1 SENSITIVE Sensitive     CIPROFLOXACIN <=0.25 SENSITIVE Sensitive     GENTAMICIN <=1 SENSITIVE Sensitive     PIP/TAZO <=4 SENSITIVE Sensitive     * MODERATE PSEUDOMONAS AERUGINOSA    Coagulation Studies: No results for input(s): LABPROT, INR in the last 72 hours.  Urinalysis: Recent Labs    01/26/21 2030  COLORURINE YELLOW  LABSPEC >1.030*  PHURINE 5.0  GLUCOSEU NEGATIVE  HGBUR SMALL*  BILIRUBINUR NEGATIVE  KETONESUR NEGATIVE  PROTEINUR 100*  NITRITE NEGATIVE  LEUKOCYTESUR LARGE*       Imaging: CT ABDOMEN PELVIS WO CONTRAST  Result Date: 01/26/2021 CLINICAL DATA:  Abdominal abscess or infection suspected. EXAM: CT ABDOMEN AND PELVIS WITHOUT CONTRAST TECHNIQUE: Multidetector CT imaging of the abdomen and pelvis was performed following the standard protocol without IV contrast. COMPARISON:  12/27/2020 FINDINGS: Lower chest: Atelectasis or infiltration demonstrated in the lung bases. Possible pneumonia. Hepatobiliary: No focal liver abnormality is seen. No gallstones, gallbladder wall thickening, or biliary dilatation.  Pancreas: Unremarkable. No pancreatic ductal dilatation or surrounding inflammatory changes. Spleen: Normal spleen size.  No focal lesions identified. Adrenals/Urinary Tract: Cyst on the left kidney is unchanged. No hydronephrosis or hydroureter. No adrenal gland nodules. Bladder is decompressed with a Foley catheter in place. Stomach/Bowel: Enteric tube with tip in the distal stomach. Partial right hemicolectomy with diverting ileostomy in the right lower quadrant. Contrast material flows through to the stoma without evidence of obstruction. The distal colon is decompressed. No wall thickening or inflammatory changes. No mesenteric collections. Vascular/Lymphatic: Aortic atherosclerosis. No enlarged abdominal  or pelvic lymph nodes. Reproductive: Prostate is unremarkable. Other: Small left inguinal hernia containing fat. No free air or free fluid in the abdomen. Musculoskeletal: No acute or significant osseous findings. IMPRESSION: 1. No intra-abdominal abscess identified. 2. No evidence of bowel obstruction or inflammation. 3. Infiltration or atelectasis in the lung bases could indicate pneumonia. Electronically Signed   By: Lucienne Capers M.D.   On: 01/26/2021 19:03     Medications:    sodium chloride Stopped (12/20/20 0955)   ampicillin-sulbactam (UNASYN) IV 3 g (01/27/21 2159)   anticoagulant sodium citrate     anticoagulant sodium citrate     anticoagulant sodium citrate     feeding supplement (NEPRO CARB STEADY) 1,000 mL (01/27/21 1548)    amLODipine  5 mg Per Tube Daily   apixaban  5 mg Per Tube BID   vitamin C  500 mg Per Tube TID   atorvastatin  20 mg Per Tube Daily   chlorhexidine gluconate (MEDLINE KIT)  15 mL Mouth Rinse BID   Chlorhexidine Gluconate Cloth  6 each Topical Q0600   Chlorhexidine Gluconate Cloth  6 each Topical Q0600   Chlorhexidine Gluconate Cloth  6 each Topical Q0600   clonazepam  0.5 mg Per Tube BID   feeding supplement (PROSource TF)  45 mL Per Tube BID   fiber  1 packet Per Tube BID   gabapentin  100 mg Per Tube Q8H   hydrocerin   Topical BID   insulin aspart  0-15 Units Subcutaneous Q4H   insulin detemir  15 Units Subcutaneous BID   levETIRAcetam  500 mg Oral BID   loperamide HCl  1 mg Per Tube QID   mouth rinse  15 mL Mouth Rinse 10 times per day   multivitamin  1 tablet Per Tube QHS   nystatin  5 mL Per Tube QID   pantoprazole sodium  40 mg Per Tube Daily   QUEtiapine  50 mg Per Tube BID   scopolamine  1 patch Transdermal Q72H   sevelamer carbonate  2.4 g Per Tube TID WC   thiamine  100 mg Per Tube Daily   sodium chloride, albuterol, clonazePAM, dextrose, glycopyrrolate, hydrALAZINE, HYDROmorphone (DILAUDID) injection, lip balm, ondansetron  (ZOFRAN) IV, oxyCODONE, sodium chloride flush  Assessment/ Plan:  1.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 CRRT initiated 7/31 and transitioned to IHD on 12/19/20.  S/p [facility] placement 12/22/20.  No signs of meaningful recovery last dialysis was 01/27/2021 Next dialysis will be 01/29/2021.  We will closely monitor for signs of recovery    2.Urinary retention   Foley catheter placed   3.Volume overload/anasarca-improved with dialysis.  4.Sepsis/aspiration pneumonia/VDRF:aspiration pneumonia.  Tracheostomy placed  4.Stage IIIB colon cancer - sp partial colectomy, colostomy. On chemoRx sp 3 cycles.   5.Right leg DVT: On Eliquis.  6.Atrial fibrillation - on amiodarone and anticoagulation.  7.Anemia of critical illness: DC'ed  IV iron as concern for infection and on abx, transfuse as needed, holding erythropoietin with active malignancy.  Monitor hemoglobin and transfuse as needed.  8. CKD-MBD; started sevelamer for hyperphosphatemia on 9/8, monitor phosphorus level.--improved   9. Disposition - poor overall prognosis.  Seen by palliative care team.  Discussion ongoing for possible transfer to [facility].  The tracheostomy should be capped before he can be accepted to [facility]    LOS: Jena @TODAY @8 :10 AM

## 2021-01-29 ENCOUNTER — Inpatient Hospital Stay (HOSPITAL_COMMUNITY): Payer: Medicare HMO

## 2021-01-29 DIAGNOSIS — E1100 Type 2 diabetes mellitus with hyperosmolarity without nonketotic hyperglycemic-hyperosmolar coma (NKHHC): Secondary | ICD-10-CM | POA: Diagnosis not present

## 2021-01-29 LAB — COMPREHENSIVE METABOLIC PANEL
ALT: 31 U/L (ref 0–44)
AST: 25 U/L (ref 15–41)
Albumin: 2.7 g/dL — ABNORMAL LOW (ref 3.5–5.0)
Alkaline Phosphatase: 116 U/L (ref 38–126)
Anion gap: 14 (ref 5–15)
BUN: 31 mg/dL — ABNORMAL HIGH (ref 8–23)
CO2: 25 mmol/L (ref 22–32)
Calcium: 8.6 mg/dL — ABNORMAL LOW (ref 8.9–10.3)
Chloride: 95 mmol/L — ABNORMAL LOW (ref 98–111)
Creatinine, Ser: 2.16 mg/dL — ABNORMAL HIGH (ref 0.61–1.24)
GFR, Estimated: 33 mL/min — ABNORMAL LOW (ref >60)
Glucose, Bld: 210 mg/dL — ABNORMAL HIGH (ref 70–99)
Potassium: 3.7 mmol/L (ref 3.5–5.1)
Sodium: 134 mmol/L — ABNORMAL LOW (ref 135–145)
Total Bilirubin: 0.6 mg/dL (ref 0.3–1.2)
Total Protein: 7.9 g/dL (ref 6.5–8.1)

## 2021-01-29 LAB — CBC WITH DIFFERENTIAL/PLATELET
Abs Immature Granulocytes: 0.36 10*3/uL — ABNORMAL HIGH (ref 0.00–0.07)
Basophils Absolute: 0.2 10*3/uL — ABNORMAL HIGH (ref 0.0–0.1)
Basophils Relative: 1 %
Eosinophils Absolute: 0.5 10*3/uL (ref 0.0–0.5)
Eosinophils Relative: 3 %
HCT: 27.3 % — ABNORMAL LOW (ref 39.0–52.0)
Hemoglobin: 9.2 g/dL — ABNORMAL LOW (ref 13.0–17.0)
Immature Granulocytes: 2 %
Lymphocytes Relative: 7 %
Lymphs Abs: 1.3 10*3/uL (ref 0.7–4.0)
MCH: 29.1 pg (ref 26.0–34.0)
MCHC: 33.7 g/dL (ref 30.0–36.0)
MCV: 86.4 fL (ref 80.0–100.0)
Monocytes Absolute: 1.4 10*3/uL — ABNORMAL HIGH (ref 0.1–1.0)
Monocytes Relative: 8 %
Neutro Abs: 14.4 10*3/uL — ABNORMAL HIGH (ref 1.7–7.7)
Neutrophils Relative %: 79 %
Platelets: 344 10*3/uL (ref 150–400)
RBC: 3.16 MIL/uL — ABNORMAL LOW (ref 4.22–5.81)
RDW: 14.8 % (ref 11.5–15.5)
WBC: 18 10*3/uL — ABNORMAL HIGH (ref 4.0–10.5)
nRBC: 0 % (ref 0.0–0.2)

## 2021-01-29 LAB — RENAL FUNCTION PANEL
Albumin: 2.5 g/dL — ABNORMAL LOW (ref 3.5–5.0)
Albumin: 2.5 g/dL — ABNORMAL LOW (ref 3.5–5.0)
Anion gap: 15 (ref 5–15)
Anion gap: 15 (ref 5–15)
BUN: 72 mg/dL — ABNORMAL HIGH (ref 8–23)
BUN: 77 mg/dL — ABNORMAL HIGH (ref 8–23)
CO2: 24 mmol/L (ref 22–32)
CO2: 26 mmol/L (ref 22–32)
Calcium: 9.2 mg/dL (ref 8.9–10.3)
Calcium: 9.2 mg/dL (ref 8.9–10.3)
Chloride: 92 mmol/L — ABNORMAL LOW (ref 98–111)
Chloride: 92 mmol/L — ABNORMAL LOW (ref 98–111)
Creatinine, Ser: 3.9 mg/dL — ABNORMAL HIGH (ref 0.61–1.24)
Creatinine, Ser: 4.01 mg/dL — ABNORMAL HIGH (ref 0.61–1.24)
GFR, Estimated: 16 mL/min — ABNORMAL LOW (ref >60)
GFR, Estimated: 16 mL/min — ABNORMAL LOW (ref >60)
Glucose, Bld: 171 mg/dL — ABNORMAL HIGH (ref 70–99)
Glucose, Bld: 161 mg/dL — ABNORMAL HIGH (ref 70–99)
Phosphorus: 6.2 mg/dL — ABNORMAL HIGH (ref 2.5–4.6)
Phosphorus: 6.1 mg/dL — ABNORMAL HIGH (ref 2.5–4.6)
Potassium: 3.8 mmol/L (ref 3.5–5.1)
Potassium: 3.8 mmol/L (ref 3.5–5.1)
Sodium: 131 mmol/L — ABNORMAL LOW (ref 135–145)
Sodium: 133 mmol/L — ABNORMAL LOW (ref 135–145)

## 2021-01-29 LAB — GLUCOSE, CAPILLARY
Glucose-Capillary: 109 mg/dL — ABNORMAL HIGH (ref 70–99)
Glucose-Capillary: 124 mg/dL — ABNORMAL HIGH (ref 70–99)
Glucose-Capillary: 151 mg/dL — ABNORMAL HIGH (ref 70–99)
Glucose-Capillary: 165 mg/dL — ABNORMAL HIGH (ref 70–99)
Glucose-Capillary: 170 mg/dL — ABNORMAL HIGH (ref 70–99)

## 2021-01-29 LAB — CBC
HCT: 24.4 % — ABNORMAL LOW (ref 39.0–52.0)
Hemoglobin: 8.3 g/dL — ABNORMAL LOW (ref 13.0–17.0)
MCH: 29.1 pg (ref 26.0–34.0)
MCHC: 34 g/dL (ref 30.0–36.0)
MCV: 85.6 fL (ref 80.0–100.0)
Platelets: 309 10*3/uL (ref 150–400)
RBC: 2.85 MIL/uL — ABNORMAL LOW (ref 4.22–5.81)
RDW: 14.8 % (ref 11.5–15.5)
WBC: 14.1 10*3/uL — ABNORMAL HIGH (ref 4.0–10.5)
nRBC: 0 % (ref 0.0–0.2)

## 2021-01-29 LAB — LACTIC ACID, PLASMA: Lactic Acid, Venous: 1.7 mmol/L (ref 0.5–1.9)

## 2021-01-29 LAB — URINE CULTURE: Culture: >=100000 — AB

## 2021-01-29 LAB — C-REACTIVE PROTEIN: CRP: 2.7 mg/dL — ABNORMAL HIGH (ref <1.0)

## 2021-01-29 MED ORDER — LORAZEPAM 2 MG/ML IJ SOLN
0.5000 mg | Freq: Two times a day (BID) | INTRAMUSCULAR | Status: DC
  Administered 2021-01-29 – 2021-01-30 (×3): 0.5 mg via INTRAVENOUS
  Filled 2021-01-29 (×2): qty 1

## 2021-01-29 MED ORDER — SODIUM CHLORIDE 0.9 % IV SOLN: 100.0000 mL | INTRAVENOUS | Status: DC | PRN

## 2021-01-29 MED ORDER — LORAZEPAM 2 MG/ML IJ SOLN
0.5000 mg | Freq: Two times a day (BID) | INTRAMUSCULAR | Status: DC | PRN
  Administered 2021-01-31: 0.5 mg via INTRAVENOUS
  Filled 2021-01-29 (×2): qty 1

## 2021-01-29 MED ORDER — LIDOCAINE-PRILOCAINE 2.5-2.5 % EX CREA: 1.0000 "application " | TOPICAL_CREAM | CUTANEOUS | Status: DC | PRN

## 2021-01-29 MED ORDER — METOPROLOL TARTRATE 5 MG/5ML IV SOLN
5.0000 mg | INTRAVENOUS | Status: DC | PRN
  Filled 2021-01-29: qty 5

## 2021-01-29 MED ORDER — ALTEPLASE 2 MG IJ SOLR: 2.0000 mg | Freq: Once | INTRAMUSCULAR | Status: DC | PRN

## 2021-01-29 MED ORDER — LORAZEPAM 2 MG/ML IJ SOLN
1.0000 mg | Freq: Once | INTRAMUSCULAR | Status: AC
  Administered 2021-01-29: 1 mg via INTRAVENOUS

## 2021-01-29 MED ORDER — LEVETIRACETAM IN NACL 500 MG/100ML IV SOLN
500.0000 mg | Freq: Two times a day (BID) | INTRAVENOUS | Status: DC
  Administered 2021-01-29 – 2021-01-31 (×4): 500 mg via INTRAVENOUS
  Filled 2021-01-29 (×5): qty 100

## 2021-01-29 MED ORDER — FLUCONAZOLE 100MG IVPB
100.0000 mg | INTRAVENOUS | Status: AC
  Administered 2021-01-29 – 2021-02-02 (×5): 100 mg via INTRAVENOUS
  Filled 2021-01-29 (×5): qty 50

## 2021-01-29 MED ORDER — LORAZEPAM 2 MG/ML IJ SOLN
INTRAMUSCULAR | Status: AC
  Filled 2021-01-29: qty 1

## 2021-01-29 MED ORDER — LORAZEPAM 2 MG/ML IJ SOLN: 0.5000 mg | Freq: Once | INTRAMUSCULAR | Status: DC

## 2021-01-29 MED ORDER — HYDROMORPHONE HCL 1 MG/ML IJ SOLN
1.0000 mg | Freq: Once | INTRAMUSCULAR | Status: AC
  Administered 2021-01-29: 1 mg via INTRAVENOUS

## 2021-01-29 MED ORDER — LIDOCAINE HCL (PF) 1 % IJ SOLN: 5.0000 mL | INTRAMUSCULAR | Status: DC | PRN

## 2021-01-29 MED ORDER — PENTAFLUOROPROP-TETRAFLUOROETH EX AERO: 1.0000 "application " | INHALATION_SPRAY | CUTANEOUS | Status: DC | PRN

## 2021-01-29 MED ORDER — INSULIN DETEMIR 100 UNIT/ML ~~LOC~~ SOLN
10.0000 [IU] | Freq: Every day | SUBCUTANEOUS | Status: DC
  Administered 2021-01-31 – 2021-02-24 (×24): 10 [IU] via SUBCUTANEOUS
  Filled 2021-01-29 (×28): qty 0.1

## 2021-01-29 MED ORDER — HEPARIN SODIUM (PORCINE) 1000 UNIT/ML DIALYSIS: 1000.0000 [IU] | INTRAMUSCULAR | Status: DC | PRN

## 2021-01-29 MED ORDER — HEPARIN SODIUM (PORCINE) 5000 UNIT/ML IJ SOLN
5000.0000 [IU] | Freq: Three times a day (TID) | INTRAMUSCULAR | Status: DC
  Administered 2021-01-29 – 2021-01-31 (×5): 5000 [IU] via SUBCUTANEOUS
  Filled 2021-01-29 (×5): qty 1

## 2021-01-29 NOTE — Progress Notes (Signed)
Patient off floor to dialysis

## 2021-01-29 NOTE — Progress Notes (Signed)
   01/29/21 1134  Assess: MEWS Score  Temp 98.6 F (37 C)  BP 131/81  ECG Heart Rate (!) 115  Resp (!) 24  SpO2 100 %  O2 Device Tracheostomy Collar  O2 Flow Rate (L/min) 5 L/min  FiO2 (%) 28 %  Assess: MEWS Score  MEWS Temp 0  MEWS Systolic 0  MEWS Pulse 2  MEWS RR 1  MEWS LOC 0  MEWS Score 3  MEWS Score Color Yellow  Assess: if the MEWS score is Yellow or Red  Were vital signs taken at a resting state? Yes  Focused Assessment No change from prior assessment  Early Detection of Sepsis Score *See Row Information* High  MEWS guidelines implemented *See Row Information* Yes  Treat  Pain Score 10  Take Vital Signs  Increase Vital Sign Frequency  Yellow: Q 2hr X 2 then Q 4hr X 2, if remains yellow, continue Q 4hrs (previously yellow)  Escalate  MEWS: Escalate Yellow: discuss with charge nurse/RN and consider discussing with provider and RRT  Notify: Charge Nurse/RN  Name of Charge Nurse/RN Notified Reaonna  Date Charge Nurse/RN Notified 01/29/21  Time Charge Nurse/RN Notified 0347  Notify: Provider  Provider Name/Title Ebony Hail, NP  Date Provider Notified 01/29/21  Time Provider Notified 1135  Notification Type Face-to-face  Notification Reason Change in status  Provider response En route;At bedside  Date of Provider Response 01/29/21  Time of Provider Response 1140  Document  Patient Outcome Stabilized after interventions  Assess: SIRS CRITERIA  SIRS Temperature  0  SIRS Pulse 1  SIRS Respirations  1  SIRS WBC 1  SIRS Score Sum  3

## 2021-01-29 NOTE — Progress Notes (Signed)
Patient returned from dialysis in extreme abdominal pain. Ebony Hail, NP made aware and will come see the patient.

## 2021-01-29 NOTE — Progress Notes (Signed)
SLP Cancellation Note  Patient Details Name: Thomas [name] MRN: [id number] DOB: June 28, 1955   Cancelled treatment:       Reason Eval/Treat Not Completed: Medical issues which prohibited therapy;Fatigue/lethargy limiting ability to participate (Pt's case was discussed with Sammantha, RN who indicated that the pt returned from HD with abdominal pain. Per RN, pt was given Ativan and Dilaudid and is now lethargic, and he was made NPO due to concern for SBO. SLP will follow up on subsequent date.)  Vianne Grieshop I. Hardin Negus, [place], [place] Office number [phone] Pager [place] 01/29/2021, 3:32 PM

## 2021-01-29 NOTE — Progress Notes (Signed)
Mr. Gonyea developed abdominal pain today, with no nausea or vomiting. Currently treated for yeast urinary tract infection. His abdominal pain was mid abdominal. Further work-up with abdominal radiographs with no free air, few air-fluid levels.  On my examination he is feeling better, he continues to have mild abdominal pain, denies any nausea or vomiting. He has a nasogastric tube in place.  His temperature is 98.6, blood pressure 136/67, heart rate 103, oxygen saturation 98% on trach collar, 5 L/min. His lungs had rhonchi, mainly from the central airway, no wheezing, heart S1-S2, present, rhythmic, abdomen soft, tender to palpation at the upper quadrants, no rebound or guarding, no lower extremity edema.  Plan: Continue conservative medical therapy, analgesics and antiacids.  As needed antiemetics. Essential medications changed to IV. Decrease dose of basal insulin to avoid hypoglycemia. Close clinical follow-up.

## 2021-01-29 NOTE — Progress Notes (Signed)
[facility] ROUNDING NOTE   Subjective:   Interval History: 65 year old gentleman with a history of colon cancer status postresection and colostomy May 2022.  Status post 3 cycles of chemotherapy last was in July 2022.  Type 2 diabetes with neuropathy hypertension hyperlipidemia presented to the emergency room 11/30/2020 with abdominal pain.  Was found to be secondary to sepsis.  Patient also was found to have acute kidney injury.  Developed atrial fibrillation with rapid ventricular rate and was admitted to intensive care unit for septic shock.  On 12/03/2020 he developed metabolic encephalopathy with hypoxia and aspiration pneumonia and required intubation 12/06/2020.  He also required pressor support.  He was reintubated since 12/15/2020 tracheostomy placed.  His hospital course was complicated by DVT.  It appears that he has recurrent Pseudomonas pneumonia.  He also has recurrent anemia requiring transfusion of 4 units of packed red blood cells.  Blood pressure 112/82 pulse 106 temperature 97.6 O2 sats 98% 5 L oxygen  Sodium 133 potassium 3.8 chloride 92 CO2 26 BUN 27 creatinine 4.0 glucose 166 calcium 9.2 phosphorus 6.1 albumin 2.5 hemoglobin 8.3  Minimal urine output although did have 500 cc 01/27/2021.  He underwent dialysis with removal of 2 L ultrafiltration 01/27/2021.  Patient is currently receiving dialysis 01/29/2021  Objective:  Vital signs in last 24 hours:  Temp:  [97.2 F (36.2 C)-98.2 F (36.8 C)] 97.9 F (36.6 C) (09/22 0758) Pulse Rate:  [93-101] 93 (09/22 0802) Resp:  [12-22] 12 (09/22 0802) BP: (111-165)/(69-86) 111/82 (09/22 0930) SpO2:  [93 %-100 %] 96 % (09/22 0758) FiO2 (%):  [28 %] 28 % (09/22 0415) Weight:  [101.7 kg-103.7 kg] 101.7 kg (09/22 0758)  Weight change: 1.7 kg Filed Weights   01/27/21 1200 01/29/21 0604 01/29/21 0758  Weight: 100 kg 103.7 kg 101.7 kg    Intake/Output: I/O last 3 completed shifts: In: 3112 [NG/GT:2712; IV  Piggyback:400] Out: 825 [Urine:425; Stool:400]   Intake/Output this shift:  Total I/O In: 50 [NG/GT:50] Out: -   General: Ill-looking male, with tracheostomy on trach collar, not in distress Heart:RRR, s1s2 nl Lungs: normal wob Abdomen:soft, Non-tender, non-distended Extremities: No edema. Neurology: Alert awake and following commands. Dialysis Access: [facility] in place.    Basic Metabolic Panel: Recent Labs  Lab 01/24/21 0122 01/25/21 0155 01/26/21 3559 01/27/21 0500 01/28/21 0247 01/29/21 0211 01/29/21 0751  NA 132*   < > 132* 131* 133* 131* 133*  K 4.2   < > 3.6 3.4* 4.1 3.8 3.8  CL 89*   < > 89* 88* 94* 92* 92*  CO2 18*   < > _0 GLUCOSE 154*   < > 219* 179* 166* 171* 161*  BUN 89*   < > 77* 94* 49* 72* 77*  CREATININE 3.87*   < > 4.04* 4.52* 3.19* 3.90* 4.01*  CALCIUM 10.0   < > 9.3 9.4 8.8* 9.2 9.2  MG 2.4  --   --   --  2.2  --   --   PHOS 10.8*   < > 8.9* 9.8* 5.2* 6.2* 6.1*   < > = values in this interval not displayed.     Liver Function Tests: Recent Labs  Lab 01/26/21 0633 01/27/21 0500 01/28/21 0247 01/29/21 0211 01/29/21 0751  ALBUMIN 2.8* 2.5* 2.6* 2.5* 2.5*    No results for input(s): LIPASE, AMYLASE in the last 168 hours. No results for input(s): AMMONIA in the last 168 hours.  CBC: Recent Labs  Lab 01/24/21 0122 01/26/21 1222 01/27/21 0734 01/28/21 0247 01/29/21 0751  WBC 15.5* 18.5* 14.3* 14.7* 14.1*  NEUTROABS  --  14.9*  --   --   --   HGB 9.3* 9.1* 8.4* 9.3* 8.3*  HCT 26.9* 26.8* 24.0* 27.3* 24.4*  MCV 84.6 84.8 83.6 84.8 85.6  PLT 241 319 301 285 309     Cardiac Enzymes: No results for input(s): CKTOTAL, CKMB, CKMBINDEX, TROPONINI in the last 168 hours.  BNP: Invalid input(s): POCBNP  CBG: Recent Labs  Lab 01/28/21 1137 01/28/21 1630 01/28/21 1920 01/28/21 2351 01/29/21 0351  GLUCAP 147* 153* 166* 169* 170*     Microbiology: Results for orders placed or performed during the hospital encounter  of 11/30/20  Resp Panel by RT-PCR (Flu A&B, Covid) Nasopharyngeal Swab     Status: None   Collection Time: 11/30/20 10:05 PM   Specimen: Nasopharyngeal Swab; Nasopharyngeal(NP) swabs in vial transport medium  Result Value Ref Range Status   SARS Coronavirus 2 by RT PCR NEGATIVE NEGATIVE Final    Comment: (NOTE) SARS-CoV-2 target nucleic acids are NOT DETECTED.  The SARS-CoV-2 RNA is generally detectable in upper respiratory specimens during the acute phase of infection. The lowest concentration of SARS-CoV-2 viral copies this assay can detect is 138 copies/mL. A negative result does not preclude SARS-Cov-2 infection and should not be used as the sole basis for treatment or other patient management decisions. A negative result may occur with  improper specimen collection/handling, submission of specimen other than nasopharyngeal swab, presence of viral mutation(s) within the areas targeted by this assay, and inadequate number of viral copies(<138 copies/mL). A negative result must be combined with clinical observations, patient history, and epidemiological information. The expected result is Negative.  Fact Sheet for Patients:  EntrepreneurPulse.com.au  Fact Sheet for Healthcare Providers:  IncredibleEmployment.be  This test is no t yet approved or cleared by the [place] FDA and  has been authorized for detection and/or diagnosis of SARS-CoV-2 by FDA under an Emergency Use Authorization (EUA). This EUA will remain  in effect (meaning this test can be used) for the duration of the COVID-19 declaration under Section 564(b)(1) of the Act, 21 U.S.C.section 360bbb-3(b)(1), unless the authorization is terminated  or revoked sooner.       Influenza A by PCR NEGATIVE NEGATIVE Final   Influenza B by PCR NEGATIVE NEGATIVE Final    Comment: (NOTE) The Xpert Xpress SARS-CoV-2/FLU/RSV plus assay is intended as an aid in the diagnosis of influenza  from Nasopharyngeal swab specimens and should not be used as a sole basis for treatment. Nasal washings and aspirates are unacceptable for Xpert Xpress SARS-CoV-2/FLU/RSV testing.  Fact Sheet for Patients: EntrepreneurPulse.com.au  Fact Sheet for Healthcare Providers: IncredibleEmployment.be  This test is not yet approved or cleared by the [place] FDA and has been authorized for detection and/or diagnosis of SARS-CoV-2 by FDA under an Emergency Use Authorization (EUA). This EUA will remain in effect (meaning this test can be used) for the duration of the COVID-19 declaration under Section 564(b)(1) of the Act, 21 U.S.C. section 360bbb-3(b)(1), unless the authorization is terminated or revoked.  Performed at [facility], [place] [address], [place], [place] [address]   Blood Culture (routine x 2)     Status: None   Collection Time: 11/30/20 10:05 PM   Specimen: BLOOD  Result Value Ref Range Status   Specimen Description   Final    BLOOD LEFT ANTECUBITAL Performed at [facility], [address] [place], [place] [address]    Special Requests   Final    BOTTLES DRAWN AEROBIC AND ANAEROBIC Blood Culture results may not be optimal due to an inadequate volume of blood received in culture bottles Performed at [place] [address] [place], [place] [address]    Culture   Final    NO GROWTH 5 DAYS Performed at [facility], [place] [address] [place], [place] [address]    Report Status 12/06/2020 FINAL  Final  Blood Culture (routine x 2)     Status: None   Collection Time: 11/30/20 10:33 PM   Specimen: BLOOD  Result Value Ref Range Status   Specimen Description   Final    BLOOD RIGHT ANTECUBITAL Performed at [place] [address], [place], [place] [address]    Special Requests   Final    BOTTLES DRAWN AEROBIC AND ANAEROBIC Blood Culture adequate  volume Performed at [place] [address] [place], [place] [address]    Culture   Final    NO GROWTH 5 DAYS Performed at [facility], [place] [address], [place], [place] [address]    Report Status 12/06/2020 FINAL  Final  Urine Culture     Status: None   Collection Time: 12/01/20  2:00 AM   Specimen: In/Out Cath Urine  Result Value Ref Range Status   Specimen Description   Final    IN/OUT CATH URINE Performed at [place] [address] [place], [place] [address]    Special Requests   Final    NONE Performed at [facility], [place] [address], [place], [place] [address]    Culture   Final    NO GROWTH Performed at [facility], [place] [address], [place], [place] [address]    Report Status 12/02/2020 FINAL  Final  MRSA Next Gen by PCR, Nasal     Status: None   Collection Time: 12/01/20  8:28 AM   Specimen: Nasal Mucosa; Nasal Swab  Result Value Ref Range Status   MRSA by PCR Next Gen NOT DETECTED NOT DETECTED Final    Comment: (NOTE) The GeneXpert MRSA Assay (FDA approved for NASAL specimens only), is one component of a comprehensive MRSA colonization surveillance program. It is not intended to diagnose MRSA infection nor to guide or monitor treatment for MRSA infections. Test performance is not FDA approved in patients less than 49 years old. Performed at [facility], [place] [address], [place], [place] [address]   C Difficile Quick Screen w PCR reflex     Status: None   Collection Time: 12/02/20  4:33 PM   Specimen: STOOL  Result Value Ref Range Status   C Diff antigen NEGATIVE NEGATIVE Final   C Diff toxin NEGATIVE NEGATIVE Final   C Diff interpretation No C. difficile detected.  Final    Comment: Performed at [facility], [place] [address], [place], [place] [address]  Culture, Respiratory w Gram Stain     Status: None   Collection Time: 12/07/20  8:14 AM    Specimen: Tracheal Aspirate; Respiratory  Result Value Ref Range Status   Specimen Description   Final    TRACHEAL ASPIRATE Performed at [place] [address] [place], [place] [address]    Special Requests   Final    NONE Performed at [facility], [place] [address] [place],  [place] [address]    Gram Stain   Final    FEW WBC PRESENT,BOTH PMN AND MONONUCLEAR ABUNDANT YEAST FEW GRAM POSITIVE RODS Performed at [facility], [place] [address], [place], [place] [address]    Culture MODERATE CANDIDA TROPICALIS  Final   Report Status 12/09/2020 FINAL  Final  Gastrointestinal Panel by PCR , Stool     Status: None   Collection Time: 12/08/20  2:05 PM   Specimen: Stool  Result Value Ref Range Status   Campylobacter species NOT DETECTED NOT DETECTED Final   Plesimonas shigelloides NOT DETECTED NOT DETECTED Final   Salmonella species NOT DETECTED NOT DETECTED Final   Yersinia enterocolitica NOT DETECTED NOT DETECTED Final   Vibrio species NOT DETECTED NOT DETECTED Final   Vibrio cholerae NOT DETECTED NOT DETECTED Final   Enteroaggregative E coli (EAEC) NOT DETECTED NOT DETECTED Final   Enteropathogenic E coli (EPEC) NOT DETECTED NOT DETECTED Final   Enterotoxigenic E coli (ETEC) NOT DETECTED NOT DETECTED Final   Shiga like toxin producing E coli (STEC) NOT DETECTED NOT DETECTED Final   Shigella/Enteroinvasive E coli (EIEC) NOT DETECTED NOT DETECTED Final   Cryptosporidium NOT DETECTED NOT DETECTED Final   Cyclospora cayetanensis NOT DETECTED NOT DETECTED Final   Entamoeba histolytica NOT DETECTED NOT DETECTED Final   Giardia lamblia NOT DETECTED NOT DETECTED Final   Adenovirus F40/41 NOT DETECTED NOT DETECTED Final   Astrovirus NOT DETECTED NOT DETECTED Final   Norovirus GI/GII NOT DETECTED NOT DETECTED Final   Rotavirus A NOT DETECTED NOT DETECTED Final   Sapovirus (I, II, IV, and V) NOT DETECTED NOT DETECTED Final    Comment: Performed at  [facility], [place] [place], [place] [address]  Culture, Respiratory w Gram Stain     Status: None   Collection Time: 12/22/20 10:13 AM   Specimen: Tracheal Aspirate; Respiratory  Result Value Ref Range Status   Specimen Description TRACHEAL ASPIRATE  Final   Special Requests NONE  Final   Gram Stain   Final    FEW SQUAMOUS EPITHELIAL CELLS PRESENT FEW WBC SEEN ABUNDANT GRAM POSITIVE COCCI FEW GRAM VARIABLE ROD Performed at [facility], [place] [address] [place], [place] [address]    Culture RARE PSEUDOMONAS AERUGINOSA  Final   Report Status 12/26/2020 FINAL  Final   Organism ID, Bacteria PSEUDOMONAS AERUGINOSA  Final      Susceptibility   Pseudomonas aeruginosa - MIC*    CEFTAZIDIME <=1 SENSITIVE Sensitive     GENTAMICIN <=1 SENSITIVE Sensitive     PIP/TAZO 8 SENSITIVE Sensitive     * RARE PSEUDOMONAS AERUGINOSA  MRSA Next Gen by PCR, Nasal     Status: Abnormal   Collection Time: 01/02/21 11:03 AM   Specimen: Nasal Mucosa; Nasal Swab  Result Value Ref Range Status   MRSA by PCR Next Gen (A) NOT DETECTED Final    INVALID, UNABLE TO DETERMINE THE PRESENCE OF TARGET DUE TO SPECIMEN INTEGRITY. RECOLLECTION REQUESTED.    Comment:        The GeneXpert MRSA Assay (FDA approved for NASAL specimens only), is one component of a comprehensive MRSA colonization surveillance program. It is not intended to diagnose MRSA infection nor to guide or monitor treatment for MRSA infections. Performed at [facility], [place] [address] [place], [place] [address]   MRSA Next Gen by PCR, Nasal     Status: None   Collection Time: 01/03/21  3:08 AM   Specimen: Nasal  Mucosa; Nasal Swab  Result Value Ref Range Status   MRSA by PCR Next Gen NOT DETECTED NOT DETECTED Final    Comment: (NOTE) The GeneXpert MRSA Assay (FDA approved for NASAL specimens only), is one component of a comprehensive MRSA colonization surveillance program. It is not intended to diagnose MRSA  infection nor to guide or monitor treatment for MRSA infections. Test performance is not FDA approved in patients less than 10 years old. Performed at [facility], [place] [address] [place], [place] [address]   Urine Culture     Status: Abnormal   Collection Time: 01/14/21  8:46 AM   Specimen: In/Out Cath Urine  Result Value Ref Range Status   Specimen Description IN/OUT CATH URINE  Final   Special Requests   Final    NONE Performed at [facility], [place] [address], [place], [place] [address]    Culture MULTIPLE SPECIES PRESENT, SUGGEST RECOLLECTION (A)  Final   Report Status 01/15/2021 FINAL  Final  Culture, blood (Routine X 2) w Reflex to ID Panel     Status: None   Collection Time: 01/14/21 11:21 AM   Specimen: BLOOD  Result Value Ref Range Status   Specimen Description BLOOD RIGHT ANTECUBITAL  Final   Special Requests   Final    BOTTLES DRAWN AEROBIC ONLY Blood Culture adequate volume   Culture   Final    NO GROWTH 5 DAYS Performed at [facility], [place] [address], [place], [place] [address]    Report Status 01/19/2021 FINAL  Final  Culture, blood (Routine X 2) w Reflex to ID Panel     Status: None   Collection Time: 01/14/21 11:22 AM   Specimen: BLOOD  Result Value Ref Range Status   Specimen Description BLOOD LEFT ANTECUBITAL  Final   Special Requests   Final    BOTTLES DRAWN AEROBIC ONLY Blood Culture adequate volume   Culture   Final    NO GROWTH 5 DAYS Performed at [facility], [place] [address] [place], [place] [address]    Report Status 01/19/2021 FINAL  Final  Culture, Respiratory w Gram Stain     Status: None   Collection Time: 01/14/21 11:40 AM   Specimen: Tracheal Aspirate; Respiratory  Result Value Ref Range Status   Specimen Description TRACHEAL ASPIRATE  Final   Special Requests NONE  Final   Gram Stain   Final    ABUNDANT WBC PRESENT,BOTH PMN AND MONONUCLEAR FEW SQUAMOUS EPITHELIAL CELLS PRESENT MODERATE GRAM NEGATIVE RODS FEW  GRAM VARIABLE ROD Performed at [facility], [place] [address] [place], [place] [address]    Culture MODERATE PSEUDOMONAS AERUGINOSA  Final   Report Status 01/18/2021 FINAL  Final   Organism ID, Bacteria PSEUDOMONAS AERUGINOSA  Final      Susceptibility   Pseudomonas aeruginosa - MIC*    CEFTAZIDIME <=1 SENSITIVE Sensitive     CIPROFLOXACIN <=0.25 SENSITIVE Sensitive     GENTAMICIN <=1 SENSITIVE Sensitive     PIP/TAZO <=4 SENSITIVE Sensitive     * MODERATE PSEUDOMONAS AERUGINOSA  Urine Culture     Status: None (Preliminary result)   Collection Time: 01/26/21  9:05 AM   Specimen: Urine, Catheterized  Result Value Ref Range Status   Specimen Description URINE, CATHETERIZED  Final   Special Requests NONE  Final   Culture   Final    CULTURE REINCUBATED FOR BETTER GROWTH Performed at [facility], [place] [address],  [place], [place] [address]    Report Status PENDING  Incomplete    Coagulation Studies: No results for input(s): LABPROT, INR in the last 72 hours.  Urinalysis: Recent Labs    01/26/21 2030  COLORURINE YELLOW  LABSPEC >1.030*  PHURINE 5.0  GLUCOSEU NEGATIVE  HGBUR SMALL*  BILIRUBINUR NEGATIVE  KETONESUR NEGATIVE  PROTEINUR 100*  NITRITE NEGATIVE  LEUKOCYTESUR LARGE*       Imaging: No results found.   Medications:    sodium chloride Stopped (12/20/20 0955)   sodium chloride     sodium chloride     ampicillin-sulbactam (UNASYN) IV 3 g (01/28/21 2328)   anticoagulant sodium citrate     feeding supplement (NEPRO CARB STEADY) 1,000 mL (01/28/21 1549)    amLODipine  5 mg Per Tube Daily   apixaban  5 mg Per Tube BID   vitamin C  500 mg Per Tube TID   atorvastatin  20 mg Per Tube Daily   chlorhexidine gluconate (MEDLINE KIT)  15 mL Mouth Rinse BID   Chlorhexidine Gluconate Cloth  6 each Topical Q0600   clonazepam  0.5 mg Per Tube BID   feeding supplement (PROSource TF)  45 mL Per Tube BID   fiber  1 packet Per Tube BID   gabapentin  100 mg  Per Tube Q8H   hydrocerin   Topical BID   insulin aspart  0-15 Units Subcutaneous Q4H   insulin detemir  15 Units Subcutaneous BID   levETIRAcetam  500 mg Oral BID   loperamide HCl  1 mg Per Tube QID   mouth rinse  15 mL Mouth Rinse 10 times per day   multivitamin  1 tablet Per Tube QHS   nystatin  5 mL Per Tube QID   pantoprazole sodium  40 mg Per Tube Daily   QUEtiapine  50 mg Per Tube BID   scopolamine  1 patch Transdermal Q72H   sevelamer carbonate  2.4 g Per Tube TID WC   thiamine  100 mg Per Tube Daily   sodium chloride, sodium chloride, sodium chloride, albuterol, alteplase, clonazePAM, dextrose, glycopyrrolate, heparin, hydrALAZINE, HYDROmorphone (DILAUDID) injection, lidocaine (PF), lidocaine-prilocaine, lip balm, ondansetron (ZOFRAN) IV, oxyCODONE, pentafluoroprop-tetrafluoroeth, sodium chloride flush  Assessment/ Plan:  1.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 CRRT initiated 7/31 and transitioned to IHD on 12/19/20.  S/p [facility] placement 12/22/20.  No signs of meaningful recovery last dialysis was 01/27/2021 Next dialysis will be 01/29/2021.  We will closely monitor for signs of recovery.  There does seem to be some improvement in urine output.  We will need tunneling of hemodialysis catheter    2.Urinary retention   Foley catheter placed   3.Volume overload/anasarca-improved with dialysis.  4.Sepsis/aspiration pneumonia/VDRF:aspiration pneumonia.  Tracheostomy placed  4.Stage IIIB colon cancer - sp partial colectomy, colostomy. On chemoRx sp 3 cycles.   5.Right leg DVT: On Eliquis.  6.Atrial fibrillation - on amiodarone and anticoagulation.    7.Anemia of critical illness: DC'ed  IV iron as concern for infection and on abx, transfuse as needed, holding erythropoietin with active malignancy.  Monitor hemoglobin and transfuse as needed.  8. CKD-MBD; started sevelamer for hyperphosphatemia on 9/8,  monitor phosphorus level.--improved   9. Disposition - poor overall prognosis.  Seen by palliative care team.  Discussion ongoing for possible transfer to [facility].  The tracheostomy should be capped before he can be accepted to [facility]    LOS: [place] @  TODAY_0 :07 AM

## 2021-01-29 NOTE — Progress Notes (Signed)
Pharmacy Antibiotic Note  Thomas [name] is a 65 y.o. male admitted on 11/30/2020 with complex medical history and extended hospital stay for AKI on CKD, sepsis and now concern for abdominal infection versus UTI .  Pharmacy has been consulted for Ampicillin/Sulbactam dosing. Patient is on a TTS dialysis schedule currently.   Today is day 4 Unasyn. UCx has been reincubated. He is afebrile, WBC are down to 14.1.   Plan: Continue Ampicillin/Sulbactam 3 g IV q12h Consider 5-7 days of therapy Pharmacy signing off but will continue to follow peripherally - please re-consult if needed   Height: 5\' 8"  (172.7 cm) Weight: 100.5 kg (221 lb 9 oz) IBW/kg (Calculated) : 68.4  Temp (24hrs), Avg:98.1 F (36.7 C), Min:97.7 F (36.5 C), Max:98.6 F (37 C)  Recent Labs  Lab 01/24/21 0122 01/25/21 0155 01/26/21 0633 01/26/21 1222 01/26/21 1229 01/27/21 0500 01/27/21 0734 01/28/21 0247 01/29/21 0211 01/29/21 0751  WBC 15.5*  --   --  18.5*  --   --  14.3* 14.7*  --  14.1*  CREATININE 3.87*   < > 4.04*  --   --  4.52*  --  3.19* 3.90* 4.01*  LATICACIDVEN  --   --   --   --  1.9  --   --   --   --   --    < > = values in this interval not displayed.     Estimated Creatinine Clearance: 21.4 mL/min (A) (by C-G formula based on SCr of 4.01 mg/dL (H)).    No Known Allergies  Antimicrobials this admission: Amp/Sul 9/19 >>   Dose adjustments this admission:   Microbiology results: 9/19 UCx: reincubated   Thank you for involving pharmacy in this patient's care.  Renold Genta, PharmD, BCPS Clinical Pharmacist Clinical phone for 01/29/2021 until 3p is [phone] 01/29/2021 11:53 AM  **Pharmacist phone directory can be found on [email or web address] listed under [place]**

## 2021-01-29 NOTE — Progress Notes (Signed)
OT Cancellation Note  Patient Details Name: Thomas [name] MRN: [id number] DOB: [birth year]   Cancelled Treatment:    Reason Eval/Treat Not Completed: Medical issues which prohibited therapy. Nursing states patient returned from HD with abdominal pain and other medical issues.  Recommended withholding therapy today.  Leiah Giannotti Alexis Goodell 01/29/2021, 12:22 PM

## 2021-01-29 NOTE — Progress Notes (Signed)
SLP Cancellation Note  Patient Details Name: Thomas [name] MRN: [id number] DOB: 22-Sep-1955   Cancelled treatment:       Reason Eval/Treat Not Completed: Patient at procedure or test/unavailable (Pt currently off unit for HD. SLP will follow up.)  Senie Lanese I. Hardin Negus, [place], [place] Office number [phone] Pager [place] 01/29/2021, 10:06 AM

## 2021-01-29 NOTE — Plan of Care (Signed)

## 2021-01-29 NOTE — Progress Notes (Addendum)
[facility] PROGRESS NOTE  Thomas [name] XBL:[id number] DOB: 1956/05/04 DOA: 11/30/2020 PCP: [name], NP  Status: Remains inpatient appropriate because:Persistent severe electrolyte disturbances, Unsafe d/c plan, and Inpatient level of care appropriate due to severity of illness  Dispo: The patient is from: Home              Anticipated d/c is to: SNF versus LTAC vs CIR              Patient currently is not medically stable to d/c.   Difficult to place patient Yes               Barriers to discharge: Currently has tracheostomy in place and is requiring dialysis but remains too weak to tolerate dialysis in chair which is a requirement for outpatient HD    Level of care: Progressive  Code Status: Full Family Communication: Wife at bedside 9/22 DVT prophylaxis: Eliquis COVID vaccination status: Moderna 07/08/2019 and 08/11/2019 with first booster dose given 08/05/2020   HPI:  65 y.o. male with a history of colon cancer s/p resection and colostomy May 2022 s/p 3 cycles chemotherapy (last was July 2022), T2DM with neuropathy, HTN, HLD who presented to the ED 7/24 with abdominal pain found to be febrile, tachycardic, tachypneic. Lactic acid was 6.6 and persisted at 6.8 after sepsis IV fluid bolus and cefepime. He had ARF, demand ischemia, bandemia, was hyperglycemic with negative ketones, with lactic acidosis. He developed AFib with RVR in the ED, was started on diltiazem, and was admitted to the ICU for septic shock with unclear source. Sepsis was subsequently felt to be ruled out at time of admission, presentation felt more likely to be related to cepacitabine toxicity with oral mucositis, hand/foot syndrome, and diarrhea/high output colostomy. He was transferred to hospitalist service 7/27 but developed metabolic encephalopathy with hyoxia due to aspiration pneumonia on 7/30 requiring intubation and pressor support. Failed extubation with inability to clear secretions, requiring  reintubation 8/8, and tracheostomy subsequently placed 8/11. This was pulled out 8/22, replaced under bronch guidance, again removed 8/25 requiring reintubation after failed reinsertion. ENT revised tracheostomy in the OR 8/29. He has required IHD for ARF, and also had complications of RLE DVT now on eliquis, colitis thought to be ischemic 8/2 not requiring specific intervention, Pseudomonas pneumonia having completed treatment, agitated encephalopathy which has improved, anemia requiring 4u PRBCs total, and thrombocytopenia which has resolved. On 9/4 he was transferred to hospitalist service having tolerated trach collar.  Subjective: Examined during dialysis.  States no longer having abdominal pain.  Told him I was having difficulty getting in touch with his wife Mahalia.  Discovered that the cell phone entered into the chart was incorrect.  He gave me the correct number and I have sent this number to his bedside nurse via secure chat so she can update in epic   Objective: Vitals:   01/29/21 0352 01/29/21 0415  BP: 129/76 137/73  Pulse: 99 95  Resp: 18 20  Temp: 97.7 F (36.5 C)   SpO2: 98% 98%    Intake/Output Summary (Last 24 hours) at 01/29/2021 0745 Last data filed at 01/29/2021 0705 Gross per 24 hour  Intake 2136 ml  Output 600 ml  Net 1536 ml   Filed Weights   01/27/21 0833 01/27/21 1200 01/29/21 0604  Weight: 102 kg 100 kg 103.7 kg    Exam: Constitutional: NAD, calm, comfortable during hemodialysis Respiratory: #6 XLT uncuffed trach 9/9; 28% trach collar, clear to auscultation.  Lungs clear  to auscultation, no secretions noted around trach.  No increased work of breathing. Cardiovascular: Heart sounds are normal, normotensive during dialysis, skin warm and dry with adequate capillary refill, trace peripheral edema. Abdomen: Soft and nontender to palpation.  Normoactive bowel sounds.  LBM 9/21; colostomy with liquid stool; cortrack for tube feedings and  medication Genitourinary: Fully catheter in place with yellow urine noted in drainage bag Neurologic: CN 2-12 grossly intact. Sensation intact, Strength 3/5 x all 4 extremities.  Psychiatric: Appears to have normal judgment and insight. Alert and oriented x 3.    Assessment/Plan: Acute problems:  Acute hypoxic respiratory failure s/p tracheostomy due to aspiration pneumonia  Recurrent pansensitive Pseudomonas tracheobronchitis -Tolerating PMV.  Trach team wants to make sure patient more mobile with stronger vocalization before trying capping trials. -Has Completed 10 days of Fortaz -Robinul added for secretions -ENT changed to cuffless trach on 9/9 -SLP following for PMV training.  Phonation improving but remains with decreased vocal intensity.  Secretions better controlled with Robinul.  Patient was able to expectorate some upper airway sputum with use of the Yankauer.  Funguria -Labs with leukocytosis and elevated CRP on 9/19 but CT abdomen unremarkable for any acute abnormality -Urinalysis abnormal and consistent with UTI.  Urine culture consistent with yeast -Was started empirically on IV Unasyn to treat both genitourinary and intra-abdominal sources of infection.  Given no evidence of intra-abdominal pathology we will discontinue Unasyn on 9/22 -Begin IV Diflucan  Acute supraumbilical abdominal pain -Labs with leukocytosis and elevated CRP on 9/19 but CT abdomen unremarkable for any acute abnormality -9/22 examined patient during dialysis denied any abdominal pain upon returning from dialysis patient had abrupt onset of supraumbilical abdominal pain   ** 9/22 called to see patient at bedside around 11:50 AM.  The excruciating mid abdominal pain above the umbilicus.  No pain in suprapubic area.  States pain is radiating up into the lower chest bilaterally.  Very hypoactive bowel sounds noted.  Because of the degree of pain patient was then he was given 0.5 of IV Dilaudid without any  relief then subsequently given IV Dilaudid 1 mg and Ativan 1 mg.  Patient became comfortable and was heavily sedated.  Briefly had episodes of O2 saturations less than 85% but would wake up easily and begin to breathe deeply.  Stat abdominal films obtained with results pending.  Labs also obtained with results pending.  Attending physician updated.  Patient also made n.p.o. with tube feedings stopped and all oral medications either stopped or converted to appropriate IV formulation.  Portable abdominal films were completed and there are definitely air-fluid levels adjacent to a segment of small bowel and this is at the area patient was having abdominal pain.  Possible area of free air underneath the right diaphragm but will defer this diagnosis to radiologist -Discussed with attending and unable to clearly see the lower diaphragms -portable chest x-ray without any evidence of free air -Due to significant dysphagia patient was n.p.o. prior to this episode at recommendation of SLP  Urinary retention -Continue to have issues with recurrent urinary retention requiring several episodes of I/O catheterization -Foley catheter inserted on 9/17  Oral Candida -Completed Diflucan x 6 doses total   Acute renal failure on stage IIIb CKD: Presumed ischemic ATN -started CRRT 7/31 > iHD 8/12 s/p left IJ [facility] 8/15.   - Continue HD TTS thru L IJ TDC per nephrology.  Patient needs to tolerate sitting up in chair for dialysis treatments -Initially not expected to  recover renal function -nephrology now documenting that he may be headed towards renal recovery.  Of note over the past 24 hours he has had a total of 575 cc urine out but this is in context of having dialysis on the same day with UF 2000 cc   High output ostomy, diarrhea:  -Infectious work-up negative -9/13 Imodium dose increased with decrease in liquid stool volume -Continue fiber supplementation -PPI discontinued   Anemia of critical illness, acute  blood loss as well as anemia of chronic disease and iron deficiency:  - s/p 4u PRBCs this hospitalization  - IV iron TTS x8 doses per nephrology. 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 Benay Spice. Started CAPOX June 2022, reduced cepecitabine dose starting 11/19/2020 due to hand/foot syndrome, reduced oxaliplatin due to renal impairment and poor performance status.  - Dr. Benay Spice has followed during this hospitalization. Per note 8/2 "[pt] is undergoing curative therapy for colon cancer..." - Palliative care has been involved during this hospitalization, though goals remain clear: full aggressive measures desired.    Moderate protein calorie malnutrition/dysphagia - Continue tube feeds via cortrack-SLP following.  Patient reports difficulty swallowing secondary to trach. 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 -Patient very motivated -continue OOB to chair TID -OT/PT note that if patient continues to make progress likely would be a candidate for CIR -Continue Oxy IR for back pain   Acute extensive right leg DVT:  -Did not have significant edema therefore did not require thrombolysis -Continue eliquis   New onset atrial fibrillation with RVR:  -Currently maintaining sinus rhythm w/o meds - Continue eliquis as above   Seizure disorder:  - Continue keppra   Hand/foot syndrome related to xeloda:  -Dose reduced in the outpatient setting and given acuity of current symptoms this medication has not been utilized during hospitalization - No infection noted at this time. Will continue moisturizing topical Tx   Acute metabolic encephalopathy, agitated delirium:  -Resolved - Continue clonazepam 0.29m per tube BID and prn-taper  and DC as tolerated - Continue quetiapine 527mBID-taper and DC as tolerated   Uncontrolled T2DM on OHAs prior to admission with associated neuropathy:  HbA1c 8.7% at admission. - CBGs well-controlled currently, continue levemir 15u BID and SSI q4h.  - Continue gabapentin 10038m8h.   HTN:  - Continue norvasc   HLD:  - Continue atorvastatin   Thrombocytopenia:  -Transient likely due to critical illness. HIT Ab negative on 8/5.    Stress ulcer prophylaxis -Continue PPI but consider discontinuing since this medication can contribute to loose stools and diarrhea   ?Sepsis - initially thought to be present on admission- has been ruled out.     Data Reviewed: Basic Metabolic Panel: Recent Labs  Lab 01/24/21 0122 01/25/21 0155 01/26/21 0636734/20/22 0500 01/28/21 0247 01/29/21 0211  NA 132* 135 132* 131* 133* 131*  K 4.2 4.0 3.6 3.4* 4.1 3.8  CL 89* 91* 89* 88* 94* 92*  CO2 18* _0 GLUCOSE 154* 147* 219* 179* 166* 171*  BUN 89* 50* 77* 94* 49* 72*  CREATININE 3.87* 3.05* 4.04* 4.52* 3.19* 3.90*  CALCIUM 10.0 9.0 9.3 9.4 8.8* 9.2  MG 2.4  --   --   --  2.2  --  PHOS 10.8* 6.9* 8.9* 9.8* 5.2* 6.2*   Liver Function Tests: Recent Labs  Lab 01/25/21 0155 01/26/21 0633 01/27/21 0500 01/28/21 0247 01/29/21 0211  ALBUMIN 2.8* 2.8* 2.5* 2.6* 2.5*    CBC: Recent Labs  Lab 01/23/21 0453 01/24/21 0122 01/26/21 1222 01/27/21 0734 01/28/21 0247  WBC 15.7* 15.5* 18.5* 14.3* 14.7*  NEUTROABS  --   --  14.9*  --   --   HGB 8.7* 9.3* 9.1* 8.4* 9.3*  HCT 26.6* 26.9* 26.8* 24.0* 27.3*  MCV 86.9 84.6 84.8 83.6 84.8  PLT 244 241 319 301 285    CBG: Recent Labs  Lab 01/28/21 1137 01/28/21 1630 01/28/21 1920 01/28/21 2351 01/29/21 0351  GLUCAP 147* 153* 166* 169* 170*    Scheduled Meds:  amLODipine  5 mg Per Tube Daily   apixaban  5 mg Per Tube BID   vitamin C  500 mg Per Tube TID   atorvastatin  20 mg Per Tube Daily   chlorhexidine gluconate  (MEDLINE KIT)  15 mL Mouth Rinse BID   Chlorhexidine Gluconate Cloth  6 each Topical Q0600   clonazepam  0.5 mg Per Tube BID   feeding supplement (PROSource TF)  45 mL Per Tube BID   fiber  1 packet Per Tube BID   gabapentin  100 mg Per Tube Q8H   hydrocerin   Topical BID   insulin aspart  0-15 Units Subcutaneous Q4H   insulin detemir  15 Units Subcutaneous BID   levETIRAcetam  500 mg Oral BID   loperamide HCl  1 mg Per Tube QID   mouth rinse  15 mL Mouth Rinse 10 times per day   multivitamin  1 tablet Per Tube QHS   nystatin  5 mL Per Tube QID   pantoprazole sodium  40 mg Per Tube Daily   QUEtiapine  50 mg Per Tube BID   scopolamine  1 patch Transdermal Q72H   sevelamer carbonate  2.4 g Per Tube TID WC   thiamine  100 mg Per Tube Daily   Continuous Infusions:  sodium chloride Stopped (12/20/20 0955)   sodium chloride     sodium chloride     ampicillin-sulbactam (UNASYN) IV 3 g (01/28/21 2328)   anticoagulant sodium citrate     feeding supplement (NEPRO CARB STEADY) 1,000 mL (01/28/21 1549)    Principal Problem:   Diabetes mellitus with hyperosmolarity without hyperglycemic hyperosmolar nonketotic coma (HCC) Active Problems:   Lactic acidosis   Acute renal failure superimposed on stage 2 chronic kidney disease (HCC)   Metabolic acidosis   Tachycardia   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 (HCC)   Adverse effect of chemotherapy   Pressure injury of skin   Atrial fibrillation with RVR (HCC)   Sepsis (HCC)   Malnutrition of moderate degree   Acute metabolic encephalopathy   Acute respiratory failure with hypoxemia (HCC)   Acute renal failure with acute cortical necrosis (HCC)   Shock ([place])   Status post tracheostomy ([place])   Endotracheal tube present   Hemodialysis status ([place])   Palliative care by specialist   SOB (shortness of breath)   Consultants: PCCM General surgery Nephrology Hematology/oncology Palliative care medicine  team  Procedures: 2u PRBC 7/31, 1u PRBC 8/17, 1u PRBC 8/27 Tracheostomy revision 01/05/2021 Dr. Constance Holster    Antibiotics: Vancomycin 7/24 Cefepime 7/24 - 7/27 Unasyn 7/30 >> cefepime/flagyl 7/31 - 8/6     Time spent: 40 minutes    [name] ANP  [facility] 7 am - 330 pm/M-F for direct patient care and secure chat Please refer to Amion for contact info 59  days

## 2021-01-30 ENCOUNTER — Inpatient Hospital Stay (HOSPITAL_COMMUNITY): Payer: Medicare HMO

## 2021-01-30 DIAGNOSIS — E1100 Type 2 diabetes mellitus with hyperosmolarity without nonketotic hyperglycemic-hyperosmolar coma (NKHHC): Secondary | ICD-10-CM | POA: Diagnosis not present

## 2021-01-30 LAB — GLUCOSE, CAPILLARY
Glucose-Capillary: 118 mg/dL — ABNORMAL HIGH (ref 70–99)
Glucose-Capillary: 112 mg/dL — ABNORMAL HIGH (ref 70–99)
Glucose-Capillary: 122 mg/dL — ABNORMAL HIGH (ref 70–99)
Glucose-Capillary: 121 mg/dL — ABNORMAL HIGH (ref 70–99)
Glucose-Capillary: 134 mg/dL — ABNORMAL HIGH (ref 70–99)
Glucose-Capillary: 127 mg/dL — ABNORMAL HIGH (ref 70–99)

## 2021-01-30 LAB — CBC WITH DIFFERENTIAL/PLATELET
Abs Immature Granulocytes: 0.44 10*3/uL — ABNORMAL HIGH (ref 0.00–0.07)
Basophils Absolute: 0.2 10*3/uL — ABNORMAL HIGH (ref 0.0–0.1)
Basophils Relative: 1 %
Eosinophils Absolute: 0.3 10*3/uL (ref 0.0–0.5)
Eosinophils Relative: 1 %
HCT: 26.7 % — ABNORMAL LOW (ref 39.0–52.0)
Hemoglobin: 9.2 g/dL — ABNORMAL LOW (ref 13.0–17.0)
Immature Granulocytes: 2 %
Lymphocytes Relative: 17 %
Lymphs Abs: 4.1 10*3/uL — ABNORMAL HIGH (ref 0.7–4.0)
MCH: 29.1 pg (ref 26.0–34.0)
MCHC: 34.5 g/dL (ref 30.0–36.0)
MCV: 84.5 fL (ref 80.0–100.0)
Monocytes Absolute: 2 10*3/uL — ABNORMAL HIGH (ref 0.1–1.0)
Monocytes Relative: 9 %
Neutro Abs: 16.9 10*3/uL — ABNORMAL HIGH (ref 1.7–7.7)
Neutrophils Relative %: 70 %
Platelets: 381 10*3/uL (ref 150–400)
RBC: 3.16 MIL/uL — ABNORMAL LOW (ref 4.22–5.81)
RDW: 14.7 % (ref 11.5–15.5)
WBC: 23.9 10*3/uL — ABNORMAL HIGH (ref 4.0–10.5)
nRBC: 0.1 % (ref 0.0–0.2)

## 2021-01-30 LAB — RENAL FUNCTION PANEL
Albumin: 2.8 g/dL — ABNORMAL LOW (ref 3.5–5.0)
Anion gap: 17 — ABNORMAL HIGH (ref 5–15)
BUN: 54 mg/dL — ABNORMAL HIGH (ref 8–23)
CO2: 19 mmol/L — ABNORMAL LOW (ref 22–32)
Calcium: 9.4 mg/dL (ref 8.9–10.3)
Chloride: 98 mmol/L (ref 98–111)
Creatinine, Ser: 4.44 mg/dL — ABNORMAL HIGH (ref 0.61–1.24)
GFR, Estimated: 14 mL/min — ABNORMAL LOW (ref >60)
Glucose, Bld: 127 mg/dL — ABNORMAL HIGH (ref 70–99)
Phosphorus: 3 mg/dL (ref 2.5–4.6)
Potassium: 4.2 mmol/L (ref 3.5–5.1)
Sodium: 134 mmol/L — ABNORMAL LOW (ref 135–145)

## 2021-01-30 MED ORDER — RENA-VITE PO TABS
1.0000 | ORAL_TABLET | Freq: Every day | ORAL | Status: DC
  Administered 2021-01-31 – 2021-02-04 (×4): 1
  Filled 2021-01-30 (×5): qty 1

## 2021-01-30 MED ORDER — NEPRO/CARBSTEADY PO LIQD
1000.0000 mL | ORAL | Status: DC
  Administered 2021-01-31 – 2021-02-03 (×5): 1000 mL
  Filled 2021-01-30 (×8): qty 1000

## 2021-01-30 MED ORDER — CHLORHEXIDINE GLUCONATE CLOTH 2 % EX PADS
6.0000 | MEDICATED_PAD | Freq: Every day | CUTANEOUS | Status: DC
Start: 2021-01-30 — End: 2021-02-20
  Administered 2021-01-30 – 2021-02-20 (×21): 6 via TOPICAL

## 2021-01-30 MED ORDER — NUTRISOURCE FIBER PO PACK
1.0000 | PACK | Freq: Two times a day (BID) | ORAL | Status: DC
  Administered 2021-01-31 – 2021-02-04 (×8): 1
  Filled 2021-01-30 (×13): qty 1

## 2021-01-30 MED ORDER — PROSOURCE TF PO LIQD
45.0000 mL | Freq: Two times a day (BID) | ORAL | Status: DC
  Administered 2021-01-31 – 2021-02-24 (×40): 45 mL
  Filled 2021-01-30 (×41): qty 45

## 2021-01-30 MED ORDER — HYDROMORPHONE HCL 1 MG/ML IJ SOLN
1.0000 mg | INTRAMUSCULAR | Status: DC | PRN
  Administered 2021-01-30 – 2021-01-31 (×7): 1 mg via INTRAVENOUS
  Filled 2021-01-30 (×8): qty 1

## 2021-01-30 MED ORDER — DEXTROSE 5 % IV SOLN: INTRAVENOUS | Status: DC

## 2021-01-30 NOTE — Progress Notes (Signed)
Nutrition Follow-up  DOCUMENTATION CODES:  Non-severe (moderate) malnutrition in context of chronic illness, Obesity unspecified  INTERVENTION:  Continue TF via Cortrak: -Nepro 1.8 @ 69m/hr (13227md) -4571mrosource TF BID   Provides 2456 kcals, 128 grams protein, 959m73mee water   - Continue renal MVI daily per tube - Continue NutriSource Fiber BID per tube  May need to consider long-term nutrition access (PEG).  NUTRITION DIAGNOSIS:  Moderate Malnutrition related to chronic illness, cancer and cancer related treatments as evidenced by mild fat depletion, mild muscle depletion, percent weight loss. - ongoing  GOAL:  Patient will meet greater than or equal to 90% of their needs - met with TF  MONITOR:  PO intake, Supplement acceptance, Diet advancement, Labs, Weight trends, Skin, I & O's  REASON FOR ASSESSMENT:  Consult Enteral/tube feeding initiation and management  ASSESSMENT:  65 y40. male who is a former with medical history of stage 3 colon cancer s/p partial colectomy and end colostomy on 5/4, recent chemo start with 3rd cycle on 7/22, sleep apnea, seizures, epilepsy, brain tumor, HTN, HLD, GERD, asthma, peripheral neuropathy, urinary frequency, DM, MCA stroke. He presented to the ED via EMS due to abdominal pain x2 weeks, anorexia, and weakness. Admitted for close monitoring for concern of mesenteric ischemia. Patient reported that abdominal pain was worse with eating PTA. 7/31 - CRRT initiated 8/09 - CRRT stopped  8/10 - transfer to [facility] trach placement and iHD 8/11 - s/p trach 8/12 - Cortrak placed (tip of tube in stomach) 8/15 - s/p placement of TDC [facility] - pulled trach out, replaced under bronch guidance, s/p bronch revealing mild area of granulation tissue of top of trach tube 8/25 - pulled trach out, transferred to ICU, emergently intubated 8/29 - trach replaced with ENT in OR 9/04 - tx out of ICU 9/09 - changed to cuffless trach  Pt on trach  collar.  Per nephrology notes, last HD on 01/29/21. We will closely monitor for signs of recovery.  There does seem to be some improvement in urine output. Pt will need tunneling of hemodialysis catheter.  SLP still recommending NPO. Recommend continuing TF via Cortrak.  Current TF Regimen: -Nepro 1.8 @ 55ml21m(1320ml/67m45ml P71murce TF BID  Provided 2456 kcals, 128 grams protein, 959ml fr72mater  Admit wt: 111.8 kg Current wt: 103.1 kg  Medications: reviewed; SSI, Levemir, Ativan BID, D5 @ 42 ml/hr, Diflucan per IV, Keppra BID per IV  Labs: reviewed; Na 134 (L), CBG 109-124 (H)  Diet Order:   Diet Order             Diet NPO time specified  Diet effective now                  EDUCATION NEEDS:  No education needs have been identified at this time  Skin:  Skin Assessment: Skin Integrity Issues: Skin Integrity Issues:: Stage II, DTI, Other (Comment), Incisions DTI: upper R and L face Stage II: coccyx, R back, L buttocks Incisions: throat (trach) Other: sloughing to L hand due to hand-foot-and-mouth disease, R thigh non-pressure friction wound  Last BM:  01/29/21 (450 ml via colostomy)  Height:  Ht Readings from Last 1 Encounters:  01/01/21 5' 8"  (1.727 m)   Weight:  Wt Readings from Last 1 Encounters:  01/30/21 103.1 kg   Ideal Body Weight:  70 kg  BMI:  Body mass index is 34.56 kg/m.  Estimated Nutritional Needs:  Kcal:  2300-2500 Protein:  120-140 gm Fluid:  1 L + UOP  Derrel Nip, RD, LDN (she/her/hers) Registered Dietitian I After-Hours/Weekend Pager # in [place]

## 2021-01-30 NOTE — Progress Notes (Signed)
Physical Therapy Treatment Patient Details Name: Thomas [name] MRN: [id number] DOB: 1956/01/27 Today's Date: 01/30/2021   History of Present Illness 65 y.o. male admitted 7/24 with high ostomy output. Pt with severe sepsis and multisystem organ failure. Hospital admission complicated by volume overload, aspiration pneumonia, acute respiratory failure, AKI and ischemic colitis. Intubated 7/30. S/p trach placement 8/11. CorTrak 8/12. HD initiated 8/12. To IR 8/15 for tunneled HD cath. Pulled out trach x2 8/22 replaced by PCCM. Pulled out trach again 8/25 but unable to be reinserted with pt intubated. 8/29 to OR for stoma clean up and trach replacement.Trach downsized 9/10.  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    PT Comments    Pt waved me away anxiously on arrival, but agreed to do bed level exercise with encouragement.  Pt somewhat distracted, restless and confused (likely in part due to dilaudid from escalating abdominal pain.  Also completed fairly extensive mouth care so pt could converse with family and voice his concerns.   Recommendations for follow up therapy are one component of a multi-disciplinary discharge planning process, led by the attending physician.  Recommendations may be updated based on patient status, additional functional criteria and insurance authorization.  Follow Up Recommendations  SNF     Equipment Recommendations  Other (comment) (TBA)    Recommendations for Other Services       Precautions / Restrictions Precautions Precautions: Fall Precaution Comments: cortrak, R subclavian tunneled HD cath, ostomy, O2 via trach collar     Mobility  Bed Mobility               General bed mobility comments: deferred all mobility    Transfers                     Ambulation/Gait                 Stairs             Wheelchair Mobility    Modified Rankin (Stroke Patients Only)       Balance                                            Cognition Arousal/Alertness: Awake/alert Behavior During Therapy: Anxious;Restless Overall Cognitive Status:  (thought were scattered and appearing mildly delirious likely due to pain meds.)                                        Exercises Other Exercises Other Exercises: bil bicep/tricep presses with graded resistance x 10 reps Other Exercises: bil hip/knee flex/ext with graded assist/resistance x10 reps    General Comments General comments (skin integrity, edema, etc.): Complete extensive mouth care inorder to more easily understand patient.  Got huge sloughs of skin off the back of his palate and teeth      Pertinent Vitals/Pain Pain Assessment: Faces Faces Pain Scale: Hurts even more Pain Location: left side Pain Descriptors / Indicators: Aching;Moaning;Grimacing Pain Intervention(s): Monitored during session;Limited activity within patient's tolerance;Premedicated before session    Home Living  Prior Function            PT Goals (current goals can now be found in the care plan section) Acute Rehab PT Goals Patient Stated Goal: to get out of here and get home. PT Goal Formulation: With patient Time For Goal Achievement: 02/04/21 Potential to Achieve Goals: Good Progress towards PT goals: Progressing toward goals    Frequency    Min 3X/week      PT Plan Current plan remains appropriate    Co-evaluation              AM-PAC PT "6 Clicks" Mobility   Outcome Measure  Help needed turning from your back to your side while in a flat bed without using bedrails?: A Little Help needed moving from lying on your back to sitting on the side of a flat bed without using bedrails?: A  Little Help needed moving to and from a bed to a chair (including a wheelchair)?: A Lot Help needed standing up from a chair using your arms (e.g., wheelchair or bedside chair)?: A Little Help needed to walk in hospital room?: A Lot Help needed climbing 3-5 steps with a railing? : Total 6 Click Score: 14    End of Session Equipment Utilized During Treatment: Oxygen Activity Tolerance: Patient tolerated treatment well;Patient limited by fatigue;Patient limited by pain Patient left: in bed;with call bell/phone within reach;with family/visitor present Nurse Communication: Mobility status PT Visit Diagnosis: Muscle weakness (generalized) (M62.81);Other abnormalities of gait and mobility (R26.89)     Time: 1715-1745 PT Time Calculation (min) (ACUTE ONLY): 30 min  Charges:  $Therapeutic Exercise: 8-22 mins $Therapeutic Activity: 8-22 mins                     01/30/2021  Thomas Carne., PT Acute Rehabilitation Services [phone]  (pager) [phone]  (office)   Thomas [name] 01/30/2021, 6:13 PM

## 2021-01-30 NOTE — Progress Notes (Signed)
[facility] ROUNDING NOTE   Subjective:   Interval History: 65 year old gentleman with a history of colon cancer status postresection and colostomy May 2022.  Status post 3 cycles of chemotherapy last was in July 2022.  Type 2 diabetes with neuropathy hypertension hyperlipidemia presented to the emergency room 11/30/2020 with abdominal pain.  Was found to be secondary to sepsis.  Patient also was found to have acute kidney injury.  Developed atrial fibrillation with rapid ventricular rate and was admitted to intensive care unit for septic shock.  On 12/03/2020 he developed metabolic encephalopathy with hypoxia and aspiration pneumonia and required intubation 12/06/2020.  He also required pressor support.  He was reintubated since 12/15/2020 tracheostomy placed.  His hospital course was complicated by DVT.  It appears that he has recurrent Pseudomonas pneumonia.  He also has recurrent anemia requiring transfusion of 4 units of packed red blood cells.  Blood pressure 112/82 pulse 106 temperature 97.6 O2 sats 98% 5 L oxygen  Sodium 133 potassium 3.8 chloride 92 CO2 26 BUN 27 creatinine 4.0 glucose 166 calcium 9.2 phosphorus 6.1 albumin 2.5 hemoglobin 8.3  Minimal urine output although did have 350 cc 01/29/2021.  Received dialysis with 1.8 L removed 01/29/2021.  Next dialysis will be 01/31/2021  Objective:  Vital signs in last 24 hours:  Temp:  [97.9 F (36.6 C)-98.6 F (37 C)] 98.2 F (36.8 C) (09/23 0353) Pulse Rate:  [93-117] 94 (09/23 0353) Resp:  [11-28] 18 (09/23 0353) BP: (111-165)/(67-112) 135/69 (09/23 0353) SpO2:  [81 %-100 %] 94 % (09/23 0353) FiO2 (%):  [28 %] 28 % (09/23 0330) Weight:  [100.5 kg-103.1 kg] 103.1 kg (09/23 0500)  Weight change: -2 kg Filed Weights   01/29/21 0758 01/29/21 1110 01/30/21 0500  Weight: 101.7 kg 100.5 kg 103.1 kg    Intake/Output: I/O last 3 completed shifts: In: 1001 [I.V.:10; NG/GT:991] Out: 2818 [Urine:350; KGURK:2706; Stool:600]    Intake/Output this shift:  Total I/O In: -  Out: 200 [Urine:200]  General: Ill-looking male, with tracheostomy on trach collar, not in distress Heart:RRR, s1s2 nl Lungs: normal wob Abdomen:soft, Non-tender, non-distended Extremities: No edema. Neurology: Alert awake and following commands. Dialysis Access: [facility] in place.    Basic Metabolic Panel: Recent Labs  Lab 01/24/21 0122 01/25/21 0155 01/26/21 2376 01/27/21 0500 01/28/21 0247 01/29/21 0211 01/29/21 0751 01/29/21 1233  NA 132*   < > 132* 131* 133* 131* 133* 134*  K 4.2   < > 3.6 3.4* 4.1 3.8 3.8 3.7  CL 89*   < > 89* 88* 94* 92* 92* 95*  CO2 18*   < > 22 23 24 24 26 25   GLUCOSE 154*   < > 219* 179* 166* 171* 161* 210*  BUN 89*   < > 77* 94* 49* 72* 77* 31*  CREATININE 3.87*   < > 4.04* 4.52* 3.19* 3.90* 4.01* 2.16*  CALCIUM 10.0   < > 9.3 9.4 8.8* 9.2 9.2 8.6*  MG 2.4  --   --   --  2.2  --   --   --   PHOS 10.8*   < > 8.9* 9.8* 5.2* 6.2* 6.1*  --    < > = values in this interval not displayed.     Liver Function Tests: Recent Labs  Lab 01/27/21 0500 01/28/21 0247 01/29/21 0211 01/29/21 0751 01/29/21 1233  AST  --   --   --   --  25  ALT  --   --   --   --  31  ALKPHOS  --   --   --   --  116  BILITOT  --   --   --   --  0.6  PROT  --   --   --   --  7.9  ALBUMIN 2.5* 2.6* 2.5* 2.5* 2.7*    No results for input(s): LIPASE, AMYLASE in the last 168 hours. No results for input(s): AMMONIA in the last 168 hours.  CBC: Recent Labs  Lab 01/26/21 1222 01/27/21 0734 01/28/21 0247 01/29/21 0751 01/29/21 1233  WBC 18.5* 14.3* 14.7* 14.1* 18.0*  NEUTROABS 14.9*  --   --   --  14.4*  HGB 9.1* 8.4* 9.3* 8.3* 9.2*  HCT 26.8* 24.0* 27.3* 24.4* 27.3*  MCV 84.8 83.6 84.8 85.6 86.4  PLT 319 301 285 309 344     Cardiac Enzymes: No results for input(s): CKTOTAL, CKMB, CKMBINDEX, TROPONINI in the last 168 hours.  BNP: Invalid input(s): POCBNP  CBG: Recent Labs  Lab 01/29/21 1132 01/29/21 1558  01/29/21 1946 01/29/21 2348 01/30/21 0351  GLUCAP 165* 151* 109* 124* 118*     Microbiology: Results for orders placed or performed during the hospital encounter of 11/30/20  Resp Panel by RT-PCR (Flu A&B, Covid) Nasopharyngeal Swab     Status: None   Collection Time: 11/30/20 10:05 PM   Specimen: Nasopharyngeal Swab; Nasopharyngeal(NP) swabs in vial transport medium  Result Value Ref Range Status   SARS Coronavirus 2 by RT PCR NEGATIVE NEGATIVE Final    Comment: (NOTE) SARS-CoV-2 target nucleic acids are NOT DETECTED.  The SARS-CoV-2 RNA is generally detectable in upper respiratory specimens during the acute phase of infection. The lowest concentration of SARS-CoV-2 viral copies this assay can detect is 138 copies/mL. A negative result does not preclude SARS-Cov-2 infection and should not be used as the sole basis for treatment or other patient management decisions. A negative result may occur with  improper specimen collection/handling, submission of specimen other than nasopharyngeal swab, presence of viral mutation(s) within the areas targeted by this assay, and inadequate number of viral copies(<138 copies/mL). A negative result must be combined with clinical observations, patient history, and epidemiological information. The expected result is Negative.  Fact Sheet for Patients:  EntrepreneurPulse.com.au  Fact Sheet for Healthcare Providers:  IncredibleEmployment.be  This test is no t yet approved or cleared by the [place] FDA and  has been authorized for detection and/or diagnosis of SARS-CoV-2 by FDA under an Emergency Use Authorization (EUA). This EUA will remain  in effect (meaning this test can be used) for the duration of the COVID-19 declaration under Section 564(b)(1) of the Act, 21 U.S.C.section 360bbb-3(b)(1), unless the authorization is terminated  or revoked sooner.       Influenza A by PCR NEGATIVE NEGATIVE  Final   Influenza B by PCR NEGATIVE NEGATIVE Final    Comment: (NOTE) The Xpert Xpress SARS-CoV-2/FLU/RSV plus assay is intended as an aid in the diagnosis of influenza from Nasopharyngeal swab specimens and should not be used as a sole basis for treatment. Nasal washings and aspirates are unacceptable for Xpert Xpress SARS-CoV-2/FLU/RSV testing.  Fact Sheet for Patients: EntrepreneurPulse.com.au  Fact Sheet for Healthcare Providers: IncredibleEmployment.be  This test is not yet approved or cleared by the [place] FDA and has been authorized for detection and/or diagnosis of SARS-CoV-2 by FDA under an Emergency Use Authorization (EUA). This EUA will remain in effect (meaning this test can be used) for the duration of the COVID-19 declaration under  Section 564(b)(1) of the Act, 21 U.S.C. section 360bbb-3(b)(1), unless the authorization is terminated or revoked.  Performed at [facility], [place] [address], [place], [place] [address]   Blood Culture (routine x 2)     Status: None   Collection Time: 11/30/20 10:05 PM   Specimen: BLOOD  Result Value Ref Range Status   Specimen Description   Final    BLOOD LEFT ANTECUBITAL Performed at [facility] [address], [place], [place] [address]    Special Requests   Final    BOTTLES DRAWN AEROBIC AND ANAEROBIC Blood Culture results may not be optimal due to an inadequate volume of blood received in culture bottles Performed at [facility] [address], [place], [place] [address]    Culture   Final    NO GROWTH 5 DAYS Performed at [facility], [place] [address] [place], [place] [address]    Report Status 12/06/2020 FINAL  Final  Blood Culture (routine x 2)     Status: None   Collection Time: 11/30/20 10:33 PM   Specimen: BLOOD  Result Value Ref Range Status   Specimen Description   Final    BLOOD RIGHT ANTECUBITAL Performed  at [facility] [address], [place], [place] [address]    Special Requests   Final    BOTTLES DRAWN AEROBIC AND ANAEROBIC Blood Culture adequate volume Performed at [facility] [address], [place], [place] [address]    Culture   Final    NO GROWTH 5 DAYS Performed at [facility], [place] [address] [place], [place] [address]    Report Status 12/06/2020 FINAL  Final  Urine Culture     Status: None   Collection Time: 12/01/20  2:00 AM   Specimen: In/Out Cath Urine  Result Value Ref Range Status   Specimen Description   Final    IN/OUT CATH URINE Performed at [facility] [address], [place], [place] [address]    Special Requests   Final    NONE Performed at [facility], [place] [address] [place], [place] [address]    Culture   Final    NO GROWTH Performed at [facility], [place] [address], [place], [place] [address]    Report Status 12/02/2020 FINAL  Final  MRSA Next Gen by PCR, Nasal     Status: None   Collection Time: 12/01/20  8:28 AM   Specimen: Nasal Mucosa; Nasal Swab  Result Value Ref Range Status   MRSA by PCR Next Gen NOT DETECTED NOT DETECTED Final    Comment: (NOTE) The GeneXpert MRSA Assay (FDA approved for NASAL specimens only), is one component of a comprehensive MRSA colonization surveillance program. It is not intended to diagnose MRSA infection nor to guide or monitor treatment for MRSA infections. Test performance is not FDA approved in patients less than 70 years old. Performed at [facility], [place] [address] [place], [place] [address]   C Difficile Quick Screen w PCR reflex     Status: None   Collection Time: 12/02/20  4:33 PM   Specimen: STOOL  Result Value Ref Range Status   C Diff antigen NEGATIVE NEGATIVE Final   C Diff toxin NEGATIVE NEGATIVE Final   C Diff interpretation No C. difficile detected.  Final    Comment: Performed at  [facility], [place] [address] [place],  [address]  Culture, Respiratory w Gram Stain     Status: None   Collection Time: 12/07/20  8:14 AM   Specimen: Tracheal Aspirate; Respiratory  Result Value Ref Range Status   Specimen Description   Final    TRACHEAL ASPIRATE Performed at [facility] [address], [place], [place] [address]    Special Requests   Final    NONE Performed at [facility], [place] [address], [place], [place] [address]    Gram Stain   Final    FEW WBC PRESENT,BOTH PMN AND MONONUCLEAR ABUNDANT YEAST FEW GRAM POSITIVE RODS Performed at [facility], [place] [address], [place], [place] [address]    Culture MODERATE CANDIDA TROPICALIS  Final   Report Status 12/09/2020 FINAL  Final  Gastrointestinal Panel by PCR , Stool     Status: None   Collection Time: 12/08/20  2:05 PM   Specimen: Stool  Result Value Ref Range Status   Campylobacter species NOT DETECTED NOT DETECTED Final   Plesimonas shigelloides NOT DETECTED NOT DETECTED Final   Salmonella species NOT DETECTED NOT DETECTED Final   Yersinia enterocolitica NOT DETECTED NOT DETECTED Final   Vibrio species NOT DETECTED NOT DETECTED Final   Vibrio cholerae NOT DETECTED NOT DETECTED Final   Enteroaggregative E coli (EAEC) NOT DETECTED NOT DETECTED Final   Enteropathogenic E coli (EPEC) NOT DETECTED NOT DETECTED Final   Enterotoxigenic E coli (ETEC) NOT DETECTED NOT DETECTED Final   Shiga like toxin producing E coli (STEC) NOT DETECTED NOT DETECTED Final   Shigella/Enteroinvasive E coli (EIEC) NOT DETECTED NOT DETECTED Final   Cryptosporidium NOT DETECTED NOT DETECTED Final   Cyclospora cayetanensis NOT DETECTED NOT DETECTED Final   Entamoeba histolytica NOT DETECTED NOT DETECTED Final   Giardia lamblia NOT DETECTED NOT DETECTED Final   Adenovirus F40/41 NOT DETECTED NOT DETECTED Final   Astrovirus NOT DETECTED NOT DETECTED Final   Norovirus  GI/GII NOT DETECTED NOT DETECTED Final   Rotavirus A NOT DETECTED NOT DETECTED Final   Sapovirus (I, II, IV, and V) NOT DETECTED NOT DETECTED Final    Comment: Performed at [facility], [place] [place], [place] [address]  Culture, Respiratory w Gram Stain     Status: None   Collection Time: 12/22/20 10:13 AM   Specimen: Tracheal Aspirate; Respiratory  Result Value Ref Range Status   Specimen Description TRACHEAL ASPIRATE  Final   Special Requests NONE  Final   Gram Stain   Final    FEW SQUAMOUS EPITHELIAL CELLS PRESENT FEW WBC SEEN ABUNDANT GRAM POSITIVE COCCI FEW GRAM VARIABLE ROD Performed at [facility], [place] [address], [place],  [address]    Culture RARE PSEUDOMONAS AERUGINOSA  Final   Report Status 12/26/2020 FINAL  Final   Organism ID, Bacteria PSEUDOMONAS AERUGINOSA  Final      Susceptibility   Pseudomonas aeruginosa - MIC*    CEFTAZIDIME <=1 SENSITIVE Sensitive     GENTAMICIN <=1 SENSITIVE Sensitive     PIP/TAZO 8 SENSITIVE Sensitive     * RARE PSEUDOMONAS AERUGINOSA  MRSA Next Gen by PCR, Nasal     Status: Abnormal   Collection Time: 01/02/21 11:03 AM   Specimen: Nasal Mucosa; Nasal Swab  Result Value Ref Range Status   MRSA by PCR Next Gen (A) NOT DETECTED Final    INVALID, UNABLE TO DETERMINE THE PRESENCE OF TARGET DUE TO SPECIMEN INTEGRITY. RECOLLECTION REQUESTED.    Comment:  The GeneXpert MRSA Assay (FDA approved for NASAL specimens only), is one component of a comprehensive MRSA colonization surveillance program. It is not intended to diagnose MRSA infection nor to guide or monitor treatment for MRSA infections. Performed at [facility], [place] [address], [place], [place] [address]   MRSA Next Gen by PCR, Nasal     Status: None   Collection Time: 01/03/21  3:08 AM   Specimen: Nasal Mucosa; Nasal Swab  Result Value Ref Range Status   MRSA by PCR Next Gen NOT DETECTED NOT DETECTED Final    Comment: (NOTE) The  GeneXpert MRSA Assay (FDA approved for NASAL specimens only), is one component of a comprehensive MRSA colonization surveillance program. It is not intended to diagnose MRSA infection nor to guide or monitor treatment for MRSA infections. Test performance is not FDA approved in patients less than 12 years old. Performed at [facility], [place] [address] [place], [place] [address]   Urine Culture     Status: Abnormal   Collection Time: 01/14/21  8:46 AM   Specimen: In/Out Cath Urine  Result Value Ref Range Status   Specimen Description IN/OUT CATH URINE  Final   Special Requests   Final    NONE Performed at [facility], [place] [address] [place], [place] [address]    Culture MULTIPLE SPECIES PRESENT, SUGGEST RECOLLECTION (A)  Final   Report Status 01/15/2021 FINAL  Final  Culture, blood (Routine X 2) w Reflex to ID Panel     Status: None   Collection Time: 01/14/21 11:21 AM   Specimen: BLOOD  Result Value Ref Range Status   Specimen Description BLOOD RIGHT ANTECUBITAL  Final   Special Requests   Final    BOTTLES DRAWN AEROBIC ONLY Blood Culture adequate volume   Culture   Final    NO GROWTH 5 DAYS Performed at [facility], [place] [address] [place], [place] [address]    Report Status 01/19/2021 FINAL  Final  Culture, blood (Routine X 2) w Reflex to ID Panel     Status: None   Collection Time: 01/14/21 11:22 AM   Specimen: BLOOD  Result Value Ref Range Status   Specimen Description BLOOD LEFT ANTECUBITAL  Final   Special Requests   Final    BOTTLES DRAWN AEROBIC ONLY Blood Culture adequate volume   Culture   Final    NO GROWTH 5 DAYS Performed at [facility], [place] [address] [place], [place] [address]    Report Status 01/19/2021 FINAL  Final  Culture, Respiratory w Gram Stain     Status: None   Collection Time: 01/14/21 11:40 AM   Specimen: Tracheal Aspirate; Respiratory  Result Value Ref Range Status   Specimen Description TRACHEAL ASPIRATE  Final    Special Requests NONE  Final   Gram Stain   Final    ABUNDANT WBC PRESENT,BOTH PMN AND MONONUCLEAR FEW SQUAMOUS EPITHELIAL CELLS PRESENT MODERATE GRAM NEGATIVE RODS FEW GRAM VARIABLE ROD Performed at [facility], [place] [address] [place], [place] [address]    Culture MODERATE PSEUDOMONAS AERUGINOSA  Final   Report Status 01/18/2021 FINAL  Final   Organism ID, Bacteria PSEUDOMONAS AERUGINOSA  Final      Susceptibility   Pseudomonas aeruginosa - MIC*    CEFTAZIDIME <=1 SENSITIVE Sensitive     CIPROFLOXACIN <=0.25 SENSITIVE Sensitive     GENTAMICIN <=1 SENSITIVE Sensitive     PIP/TAZO <=4 SENSITIVE Sensitive     *  MODERATE PSEUDOMONAS AERUGINOSA  Urine Culture     Status: Abnormal   Collection Time: 01/26/21  9:05 AM   Specimen: Urine, Catheterized  Result Value Ref Range Status   Specimen Description URINE, CATHETERIZED  Final   Special Requests   Final    NONE Performed at [facility], [address], [place], [place] [address]    Culture >=100,000 COLONIES/mL YEAST (A)  Final   Report Status 01/29/2021 FINAL  Final    Coagulation Studies: No results for input(s): LABPROT, INR in the last 72 hours.  Urinalysis: No results for input(s): COLORURINE, LABSPEC, PHURINE, GLUCOSEU, HGBUR, BILIRUBINUR, KETONESUR, PROTEINUR, UROBILINOGEN, NITRITE, LEUKOCYTESUR in the last 72 hours.  Invalid input(s): APPERANCEUR     Imaging: DG CHEST PORT 1 VIEW  Result Date: 01/29/2021 CLINICAL DATA:  Acute abdominal pain EXAM: PORTABLE CHEST 1 VIEW COMPARISON:  Chest x-ray dated January 22, 2021 FINDINGS: Right-sided port, left dialysis catheter, ET tube, and enteric tube are unchanged in position. Mild bibasilar atelectasis. Lungs are otherwise clear. No large pleural effusion or evidence of pneumothorax. IMPRESSION: Bibasilar atelectasis.  Lungs otherwise clear. Stable support devices. Electronically Signed   By: Yetta Glassman M.D.   On: 01/29/2021 14:09   DG Abd 2  Views  Result Date: 01/29/2021 CLINICAL DATA:  Acute abdominal pain, history of right abdominal ostomy EXAM: ABDOMEN - 2 VIEW COMPARISON:  01/22/2021 abdominal radiograph FINDINGS: Enteric tube courses through the stomach with tip in the right upper quadrant likely in the region of the pylorus. A few mildly dilated bowel loops are present in the right abdomen up to the 3.9 cm diameter with associated air-fluid levels on the decubitus view. Gasless left abdomen. Colostomy noted in the lateral right abdomen. No evidence of pneumatosis or pneumoperitoneum. IMPRESSION: 1. Enteric tube courses through the stomach with tip in the right upper quadrant likely in the region of the pylorus. 2. No evidence of pneumoperitoneum. 3. Mildly dilated bowel loops in the right abdomen with associated air-fluid levels, differential includes mild partial distal small bowel obstruction versus adynamic ileus. Electronically Signed   By: Ilona Sorrel M.D.   On: 01/29/2021 13:27     Medications:    sodium chloride Stopped (12/20/20 0955)   anticoagulant sodium citrate     fluconazole (DIFLUCAN) IV 100 mg (01/29/21 1509)   levETIRAcetam 500 mg (01/30/21 0504)    vitamin C  500 mg Per Tube TID   chlorhexidine gluconate (MEDLINE KIT)  15 mL Mouth Rinse BID   Chlorhexidine Gluconate Cloth  6 each Topical Q0600   heparin injection (subcutaneous)  5,000 Units Subcutaneous Q8H   hydrocerin   Topical BID   insulin aspart  0-15 Units Subcutaneous Q4H   insulin detemir  10 Units Subcutaneous Daily   loperamide HCl  1 mg Per Tube QID   LORazepam  0.5 mg Intravenous Q12H   mouth rinse  15 mL Mouth Rinse 10 times per day   scopolamine  1 patch Transdermal Q72H   sodium chloride, albuterol, dextrose, glycopyrrolate, HYDROmorphone (DILAUDID) injection, lip balm, LORazepam, metoprolol tartrate, ondansetron (ZOFRAN) IV, sodium chloride flush  Assessment/ Plan:  1.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 CRRT initiated 7/31 and transitioned to IHD on 12/19/20.  S/p [facility] placement 12/22/20.  No signs of meaningful recovery last dialysis was 01/27/2021 Next dialysis will be 01/29/2021.  We will closely monitor for signs of recovery.  There does seem  to be some improvement in urine output.  Next dialysis will be 01/31/2021.    2.Urinary retention   Foley catheter placed   3.Volume overload/anasarca-improved with dialysis.  4.Sepsis/aspiration pneumonia/VDRF:aspiration pneumonia.  Tracheostomy placed  4.Stage IIIB colon cancer - sp partial colectomy, colostomy. On chemoRx sp 3 cycles.   5.Right leg DVT: On Eliquis.  6.Atrial fibrillation - on amiodarone and anticoagulation.    7.Anemia of critical illness: DC'ed  IV iron as concern for infection and on abx, transfuse as needed, holding erythropoietin with active malignancy.  Monitor hemoglobin and transfuse as needed.  8. CKD-MBD; started sevelamer for hyperphosphatemia on 9/8, monitor phosphorus level.--improved   9. Disposition - poor overall prognosis.  Seen by palliative care team.  Discussion ongoing for possible transfer to [facility].  The tracheostomy should be capped before he can be accepted to [facility]    LOS: [place] @TODAY @7 :14 AM

## 2021-01-30 NOTE — Progress Notes (Signed)
Pt OTF @ CT.

## 2021-01-30 NOTE — Progress Notes (Signed)
SLP Cancellation Note  Patient Details Name: Thomas [name] MRN: [id number] DOB: February 13, 1956   Cancelled treatment:       Reason Eval/Treat Not Completed: Other (comment) (Pt was approached for treatment. He remains NPO for possible SBO. Pt was agitated and horsely shouting, "Help! Help!" upon SLP's entry and stated that he felt that he was choking. Pt's RN contacted and indicated that pt has been very anxious today. Treatment was deferred to allow pt's RN to assess pt for pain management and possible Ativan)  Lekha Dancer I. Hardin Negus, [place], [place] Office number [phone] Pager [place] 01/30/2021, 3:12 PM

## 2021-01-30 NOTE — Progress Notes (Signed)
[facility] PROGRESS NOTE  Thomas [name] SEG:[id number] DOB: September 05, 1955 DOA: 11/30/2020 PCP: [name], NP  Status: Remains inpatient appropriate because:Persistent severe electrolyte disturbances, Unsafe d/c plan, and Inpatient level of care appropriate due to severity of illness  Dispo: The patient is from: Home              Anticipated d/c is to: SNF versus LTAC vs CIR              Patient currently is not medically stable to d/c.   Difficult to place patient Yes               Barriers to discharge: Currently has tracheostomy in place and is requiring dialysis but remains too weak to tolerate dialysis in chair which is a requirement for outpatient HD    Level of care: Progressive  Code Status: Full Family Communication: Wife at bedside 9/22 DVT prophylaxis: Eliquis COVID vaccination status: Moderna 07/08/2019 and 08/11/2019 with first booster dose given 08/05/2020   HPI:  65 y.o. male with a history of colon cancer s/p resection and colostomy May 2022 s/p 3 cycles chemotherapy (last was July 2022), T2DM with neuropathy, HTN, HLD who presented to the ED 7/24 with abdominal pain found to be febrile, tachycardic, tachypneic. Lactic acid was 6.6 and persisted at 6.8 after sepsis IV fluid bolus and cefepime. He had ARF, demand ischemia, bandemia, was hyperglycemic with negative ketones, with lactic acidosis. He developed AFib with RVR in the ED, was started on diltiazem, and was admitted to the ICU for septic shock with unclear source. Sepsis was subsequently felt to be ruled out at time of admission, presentation felt more likely to be related to cepacitabine toxicity with oral mucositis, hand/foot syndrome, and diarrhea/high output colostomy. He was transferred to hospitalist service 7/27 but developed metabolic encephalopathy with hyoxia due to aspiration pneumonia on 7/30 requiring intubation and pressor support. Failed extubation with inability to clear secretions, requiring  reintubation 8/8, and tracheostomy subsequently placed 8/11. This was pulled out 8/22, replaced under bronch guidance, again removed 8/25 requiring reintubation after failed reinsertion. ENT revised tracheostomy in the OR 8/29. He has required IHD for ARF, and also had complications of RLE DVT now on eliquis, colitis thought to be ischemic 8/2 not requiring specific intervention, Pseudomonas pneumonia having completed treatment, agitated encephalopathy which has improved, anemia requiring 4u PRBCs total, and thrombocytopenia which has resolved. On 9/4 he was transferred to hospitalist service having tolerated trach collar.  Subjective: Continues to have abdominal pain above the umbilicus mid abdomen.  States pain meds are helping.  No nausea   Objective: Vitals:   01/30/21 0330 01/30/21 0353  BP: 139/71 135/69  Pulse: 95 94  Resp: 17 18  Temp:  98.2 F (36.8 C)  SpO2: 94% 94%    Intake/Output Summary (Last 24 hours) at 01/30/2021 0731 Last data filed at 01/30/2021 0704 Gross per 24 hour  Intake 10 ml  Output 2518 ml  Net -2508 ml   Filed Weights   01/29/21 0758 01/29/21 1110 01/30/21 0500  Weight: 101.7 kg 100.5 kg 103.1 kg    Exam: Constitutional: NAD, calm, slightly uncomfortable secondary to ongoing abdominal pain Respiratory: #6 XLT uncuffed trach 9/9; 28% trach collar, clear to auscultation.  CTA and stable on room air with normal pulse oximetry readings, no increased work of breathing Cardiovascular: S1-S2, no peripheral edema or JVD, normotensive Abdomen: Soft and somewhat stented as compared to 9/22.  Hypoactive bowel sounds.  No gas in  colostomy bag and there is a small amount of liquid and mushy stool LBM 9/21; cortrack clamped due to n.p.o. status Genitourinary: Fully catheter in place with yellow urine noted in drainage bag Neurologic: CN 2-12 grossly intact. Sensation intact, Strength 3/5 x all 4 extremities.  Psychiatric: Appears to have normal judgment and insight.  Alert and oriented x 3.    Assessment/Plan: Acute problems:  Acute hypoxic respiratory failure s/p tracheostomy due to aspiration pneumonia  Recurrent pansensitive Pseudomonas tracheobronchitis -Tolerating PMV.  Trach team wants to make sure patient more mobile with stronger vocalization before trying capping trials. -Has Completed 10 days of Fortaz -Robinul added for secretions -ENT changed to cuffless trach on 9/9 -SLP following for PMV training.  Phonation improving but remains with decreased vocal intensity.  Secretions better controlled with Robinul.  Patient was able to expectorate some upper airway sputum with use of the Yankauer.  Funguria -Labs with leukocytosis and elevated CRP on 9/19 but CT abdomen unremarkable for any acute abnormality -Urine culture consistent with yeast -Continue IV Diflucan since n.p.o.  Acute supraumbilical abdominal pain 2/2 PSBO vs ileus -Labs with leukocytosis and elevated CRP on 9/19 but CT abdomen unremarkable for any acute abnormality -9/22 abdominal pain after dialysis with flat and decubitus films revealing air-fluid levels and partial small bowel obstruction  -Due to significant dysphagia patient was n.p.o. prior to this episode at recommendation of SLP -Repeat abdominal films 9/23 without any significant change and continues to demonstrate partial small bowel obstruction -Patient had been on scheduled Imodium and this could have contributed to the symptoms.  Patient also had significant abdominal surgery in May and he could be developing adhesions  Urinary retention -Continue to have issues with recurrent urinary retention requiring several episodes of I/O catheterization -Foley catheter inserted on 9/17-initial plan on 9/22 was to discontinue and obtain serial bladder scans but given current P SBO we will hold off on discontinuing Foley for now especially now that abdomen is slightly distended.  Oral Candida -Completed Diflucan x 6 doses  total   Acute renal failure on stage IIIb CKD: Presumed ischemic ATN -started CRRT 7/31 > iHD 8/12 s/p left IJ [facility] 8/15.   - Continue HD TTS thru L IJ TDC per nephrology.  Patient needs to tolerate sitting up in chair for dialysis treatments -Initially not expected to recover renal function -nephrology now documenting that he may be headed towards renal recovery.  Of note over the past 24 hours he has had a total of 575 cc urine out but this is in context of having dialysis on the same day with UF 2000 cc   High output ostomy, diarrhea:  -Infectious work-up negative -9/13 Imodium dose increased with decrease in liquid stool volume -Continue fiber supplementation -PPI discontinued   Anemia of critical illness, acute blood loss as well as anemia of chronic disease and iron deficiency:  - s/p 4u PRBCs this hospitalization  - IV iron TTS x8 doses per nephrology. 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 Benay Spice. Started CAPOX June 2022, reduced cepecitabine dose starting 11/19/2020 due to hand/foot syndrome, reduced oxaliplatin due to renal impairment and poor performance status.  - Dr. Benay Spice has followed during this hospitalization. Per note 8/2 "[pt] is undergoing curative therapy for colon cancer..." - Palliative care has been involved during this hospitalization, though goals remain clear: full aggressive measures desired.  Moderate protein calorie malnutrition/dysphagia - Continue tube feeds via cortrack-SLP following.  Patient reports difficulty swallowing secondary to trach. 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 -Patient very motivated -continue OOB to chair TID -OT/PT note that if patient continues to make  progress likely would be a candidate for CIR -Continue Oxy IR for back pain   Acute extensive right leg DVT:  -Did not have significant edema therefore did not require thrombolysis -Continue eliquis   New onset atrial fibrillation with RVR:  -Currently maintaining sinus rhythm w/o meds - Continue eliquis as above   Seizure disorder:  - Continue keppra   Hand/foot syndrome related to xeloda:  -Dose reduced in the outpatient setting and given acuity of current symptoms this medication has not been utilized during hospitalization - No infection noted at this time. Will continue moisturizing topical Tx   Acute metabolic encephalopathy, agitated delirium:  -Resolved - Continue clonazepam 0.77m per tube BID and prn-taper and DC as tolerated - Continue quetiapine 561mBID-taper and DC as tolerated   Uncontrolled T2DM on OHAs prior to admission with associated neuropathy:  HbA1c 8.7% at admission. - CBGs well-controlled currently, continue levemir 15u BID and SSI q4h.  - Continue gabapentin 10033m8h.   HTN:  - Continue norvasc   HLD:  - Continue atorvastatin   Thrombocytopenia:  -Transient likely due to critical illness. HIT Ab negative on 8/5.    Stress ulcer prophylaxis -Continue PPI but consider discontinuing since this medication can contribute to loose stools and diarrhea   ?Sepsis - initially thought to be present on admission- has been ruled out.     Data Reviewed: Basic Metabolic Panel: Recent Labs  Lab 01/24/21 0122 01/25/21 0155 01/26/21 0637017/20/22 0500 01/28/21 0247 01/29/21 0211 01/29/21 0751 01/29/21 1233  NA 132*   < > 132* 131* 133* 131* 133* 134*  K 4.2   < > 3.6 3.4* 4.1 3.8 3.8 3.7  CL 89*   < > 89* 88* 94* 92* 92* 95*  CO2 18*   < > 22 23 24 24 26 25   GLUCOSE 154*   < > 219* 179* 166* 171* 161* 210*  BUN 89*   < > 77* 94* 49* 72* 77* 31*  CREATININE 3.87*   < > 4.04* 4.52* 3.19* 3.90* 4.01* 2.16*  CALCIUM 10.0   < > 9.3 9.4 8.8* 9.2 9.2  8.6*  MG 2.4  --   --   --  2.2  --   --   --   PHOS 10.8*   < > 8.9* 9.8* 5.2* 6.2* 6.1*  --    < > = values in this interval not displayed.   Liver Function Tests: Recent Labs  Lab 01/27/21 0500 01/28/21 0247 01/29/21 0211 01/29/21 0751 01/29/21 1233  AST  --   --   --   --  25  ALT  --   --   --   --  31  ALKPHOS  --   --   --   --  116  BILITOT  --   --   --   --  0.6  PROT  --   --   --   --  7.9  ALBUMIN 2.5* 2.6* 2.5* 2.5* 2.7*    CBC: Recent Labs  Lab 01/26/21 1222 01/27/21 0734 01/28/21 0247 01/29/21 0751 01/29/21 1233  WBC 18.5* 14.3* 14.7*  14.1* 18.0*  NEUTROABS 14.9*  --   --   --  14.4*  HGB 9.1* 8.4* 9.3* 8.3* 9.2*  HCT 26.8* 24.0* 27.3* 24.4* 27.3*  MCV 84.8 83.6 84.8 85.6 86.4  PLT 319 301 285 309 344    CBG: Recent Labs  Lab 01/29/21 1132 01/29/21 1558 01/29/21 1946 01/29/21 2348 01/30/21 0351  GLUCAP 165* 151* 109* 124* 118*    Scheduled Meds:  vitamin C  500 mg Per Tube TID   chlorhexidine gluconate (MEDLINE KIT)  15 mL Mouth Rinse BID   Chlorhexidine Gluconate Cloth  6 each Topical Q0600   heparin injection (subcutaneous)  5,000 Units Subcutaneous Q8H   hydrocerin   Topical BID   insulin aspart  0-15 Units Subcutaneous Q4H   insulin detemir  10 Units Subcutaneous Daily   loperamide HCl  1 mg Per Tube QID   LORazepam  0.5 mg Intravenous Q12H   mouth rinse  15 mL Mouth Rinse 10 times per day   scopolamine  1 patch Transdermal Q72H   Continuous Infusions:  sodium chloride Stopped (12/20/20 0955)   anticoagulant sodium citrate     fluconazole (DIFLUCAN) IV 100 mg (01/29/21 1509)   levETIRAcetam 500 mg (01/30/21 0504)    Principal Problem:   Diabetes mellitus with hyperosmolarity without hyperglycemic hyperosmolar nonketotic coma (HCC) Active Problems:   Lactic acidosis   Acute renal failure superimposed on stage 2 chronic kidney disease (HCC)   Metabolic acidosis   Tachycardia   Hyperosmolar non-ketotic state due to type 2  diabetes mellitus (HCC)   Adverse effect of chemotherapy   Pressure injury of skin   Atrial fibrillation with RVR (HCC)   Sepsis (HCC)   Malnutrition of moderate degree   Acute metabolic encephalopathy   Acute respiratory failure with hypoxemia (HCC)   Acute renal failure with acute cortical necrosis (HCC)   Shock ([place])   Status post tracheostomy ([place])   Endotracheal tube present   Hemodialysis status ([place])   Palliative care by specialist   SOB (shortness of breath)   Consultants: PCCM General surgery Nephrology Hematology/oncology Palliative care medicine team  Procedures: 2u PRBC 7/31, 1u PRBC 8/17, 1u PRBC 8/27 Tracheostomy revision 01/05/2021 Dr. Constance Holster    Antibiotics: Vancomycin 7/24 Cefepime 7/24 - 7/27 Unasyn 7/30 >> cefepime/flagyl 7/31 - 8/6     Time spent: 40 minutes    [name] ANP  [facility] 7 am - 330 pm/M-F for direct patient care and secure chat Please refer to Amion for contact info 60  days

## 2021-01-30 NOTE — Progress Notes (Signed)
[name] continue to have abdominal pain today, his tube feedings have been placed on hold and his medications are mostly IV.   VS temp 98.1, blood pressure 157/80, HR 103 and rr 22. Oxygenation 100% per trach collar.  Patient is awake and alert Lungs with no wheezing or rhonchi Abdomen with ostomy and place, very tender to superficial palpation. No lower extremity edema HD catheter on the left chest wall.   65 year old male past medical history for colon cancer s/p resection, colostomy May 2022, had 3 cycles of chemotherapy and 18 November 2020.  He also has type 2 diabetes mellitus, hypertension and dyslipidemia.  Prolonged hospitalization.  Initial presentation with abdominal pain and sepsis syndrome, complicated with septic shock, source mucositis combined with cepacitabine toxicity.  His hospital stay was complicated by atrial fibrillation with rapid ventricular response, Pseudomonas aspiration pneumonia with respiratory failure requiring mechanical ventilation.  Developed ventilator dependent respiratory failure, failed extubation 8/8, underwent tracheostomy 8/11.  Progressive renal failure to end-stage renal disease, now on renal replacement therapy.  P. Patient has developed worsening abdominal pain. Abdominal radiograph with mild ileus.  Follow-up with CT abdomen pelvis. Continue holding tube feeds.

## 2021-01-30 NOTE — Progress Notes (Signed)
Occupational Therapy Treatment Patient Details Name: Thomas [name] MRN: [id number] DOB: [birth year] Today's Date: 01/30/2021   History of present illness 65 y.o. male admitted 7/24 with high ostomy output. Pt with severe sepsis and multisystem organ failure. Hospital admission complicated by volume overload, aspiration pneumonia, acute respiratory failure, AKI and ischemic colitis. Intubated 7/30. S/p trach placement 8/11. CorTrak 8/12. HD initiated 8/12. To IR 8/15 for tunneled HD cath. Pulled out trach x2 8/22 replaced by PCCM. Pulled out trach again 8/25 but unable to be reinserted with pt intubated. 8/29 to OR for stoma clean up and trach replacement.Trach downsized 9/10.  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   OT comments  Patient stated he did not feel well but would do what he could.  Patient was willing to get to eob and address HEP. Patient performed BUE elbow flexion/extension theraband exercises and asked to return to supine due to pain at left side. Patient performed shoulder exercises with theraband while supine.  Acute OT to continue to follow.    Recommendations for follow up therapy are one component of a multi-disciplinary discharge planning process, led by the attending physician.  Recommendations may be updated based on patient status, additional functional criteria and insurance authorization.    Follow Up Recommendations  SNF;Supervision/Assistance - 24 hour    Equipment Recommendations       Recommendations for Other Services      Precautions / Restrictions Precautions Precautions: Fall Precaution Comments: cortrak, R subclavian tunneled HD cath, ostomy, O2 via trach collar       Mobility Bed Mobility Overal bed mobility: Needs Assistance Bed Mobility:  Supine to Sit;Sit to Supine     Supine to sit: Min assist Sit to supine: Mod assist   General bed mobility comments: required assistance to position in bed    Transfers                      Balance Overall balance assessment: Needs assistance Sitting-balance support: No upper extremity supported;Bilateral upper extremity supported;Feet supported Sitting balance-Leahy Scale: Good Sitting balance - Comments: performed bue elbow flexion and extension seated on eob                                   ADL either performed or assessed with clinical judgement   ADL                                               Vision       Perception     Praxis      Cognition Arousal/Alertness: Awake/alert Behavior During Therapy: [facility] for tasks assessed/performed;Anxious Overall Cognitive Status: Within Functional Limits for tasks assessed Area of Impairment: Problem solving                             Problem Solving: Slow processing;Requires verbal cues General Comments: eager to participate but limited by pain        Exercises Exercises: General Upper Extremity General Exercises - Upper Extremity Shoulder Flexion: Strengthening;Both;10 reps;Supine;Theraband Theraband Level (Shoulder Flexion): Level 1 (  Yellow) Shoulder ABduction: Strengthening;Both;10 reps;Supine;Theraband Theraband Level (Shoulder Abduction): Level 1 (Yellow) Elbow Flexion: Strengthening;Both;15 reps;Seated;Theraband Theraband Level (Elbow Flexion): Level 1 (Yellow) Elbow Extension: Strengthening;Both;15 reps;Seated;Theraband Theraband Level (Elbow Extension): Level 1 (Yellow)   Shoulder Instructions       General Comments      Pertinent Vitals/ Pain       Pain Assessment: Faces Faces Pain Scale: Hurts even more Pain Location: left side Pain Descriptors / Indicators: Aching;Moaning;Grimacing Pain Intervention(s): Repositioned  Home Living                                           Prior Functioning/Environment              Frequency  Min 2X/week        Progress Toward Goals  OT Goals(current goals can now be found in the care plan section)  Progress towards OT goals: Progressing toward goals  Acute Rehab OT Goals Patient Stated Goal: to get out of here and get home. OT Goal Formulation: With patient Time For Goal Achievement: 02/06/21 Potential to Achieve Goals: Good ADL Goals Pt Will Perform Grooming: with min guard assist;standing Pt Will Perform Upper Body Dressing: with min assist;sitting Pt Will Perform Lower Body Dressing: with mod assist;sit to/from stand Pt Will Transfer to Toilet: with supervision;ambulating;bedside commode Pt Will Perform Toileting - Clothing Manipulation and hygiene: with supervision;sit to/from stand Pt Will Perform Tub/Shower Transfer: Tub transfer;Shower transfer;3 in Energy manager will Perform Home Exercise Program: Increased ROM;Increased strength;Right Upper extremity;Left upper extremity;With written HEP provided;Independently Additional ADL Goal #1: Pt will demosntrate increased activity tolerance to perform two standing groomign tasks at sink with Min Guard A Additional ADL Goal #2: [place] will complete sit to stand transfers with Mod A +2 in prep for ADLs. Additional ADL Goal #3: Patient will attend to 2/3 grooming tasks without cues for continuation indicating increased attention to task.  Plan Discharge plan remains appropriate    Co-evaluation                 AM-PAC OT "6 Clicks" Daily Activity     Outcome Measure   Help from another person eating meals?: Total Help from another person taking care of personal grooming?: A Little Help from another person toileting, which includes using toliet, bedpan, or urinal?: A Lot Help from another person bathing (including washing, rinsing, drying)?: A Lot Help from another person to put on and  taking off regular upper body clothing?: Total Help from another person to put on and taking off regular lower body clothing?: Total 6 Click Score: 10    End of Session Equipment Utilized During Treatment: Oxygen  OT Visit Diagnosis: Unsteadiness on feet (R26.81);Muscle weakness (generalized) (M62.81);Pain Pain - Right/Left: Left Pain - part of body:  (left side)   Activity Tolerance Patient limited by pain   Patient Left in bed;with call bell/phone within reach;with bed alarm set   Nurse Communication          Time: 1610-9604 OT Time Calculation (min): 17 min  Charges: OT General Charges $OT Visit: 1 Visit OT Treatments $Therapeutic Exercise: 8-22 mins  Lodema Hong, OTA   Harpreet Signore Alexis Goodell 01/30/2021, 10:45 AM

## 2021-01-31 DIAGNOSIS — J9601 Acute respiratory failure with hypoxia: Secondary | ICD-10-CM | POA: Diagnosis not present

## 2021-01-31 DIAGNOSIS — G9341 Metabolic encephalopathy: Secondary | ICD-10-CM | POA: Diagnosis not present

## 2021-01-31 DIAGNOSIS — N171 Acute kidney failure with acute cortical necrosis: Secondary | ICD-10-CM | POA: Diagnosis not present

## 2021-01-31 DIAGNOSIS — E1100 Type 2 diabetes mellitus with hyperosmolarity without nonketotic hyperglycemic-hyperosmolar coma (NKHHC): Secondary | ICD-10-CM | POA: Diagnosis not present

## 2021-01-31 LAB — RENAL FUNCTION PANEL
Albumin: 2.6 g/dL — ABNORMAL LOW (ref 3.5–5.0)
Anion gap: 13 (ref 5–15)
BUN: 58 mg/dL — ABNORMAL HIGH (ref 8–23)
CO2: 23 mmol/L (ref 22–32)
Calcium: 9.2 mg/dL (ref 8.9–10.3)
Chloride: 98 mmol/L (ref 98–111)
Creatinine, Ser: 4.63 mg/dL — ABNORMAL HIGH (ref 0.61–1.24)
GFR, Estimated: 13 mL/min — ABNORMAL LOW (ref >60)
Glucose, Bld: 144 mg/dL — ABNORMAL HIGH (ref 70–99)
Phosphorus: 5.8 mg/dL — ABNORMAL HIGH (ref 2.5–4.6)
Potassium: 4 mmol/L (ref 3.5–5.1)
Sodium: 134 mmol/L — ABNORMAL LOW (ref 135–145)

## 2021-01-31 LAB — GLUCOSE, CAPILLARY
Glucose-Capillary: 133 mg/dL — ABNORMAL HIGH (ref 70–99)
Glucose-Capillary: 135 mg/dL — ABNORMAL HIGH (ref 70–99)
Glucose-Capillary: 184 mg/dL — ABNORMAL HIGH (ref 70–99)
Glucose-Capillary: 211 mg/dL — ABNORMAL HIGH (ref 70–99)

## 2021-01-31 LAB — CBC
HCT: 25.5 % — ABNORMAL LOW (ref 39.0–52.0)
Hemoglobin: 8.5 g/dL — ABNORMAL LOW (ref 13.0–17.0)
MCH: 28.7 pg (ref 26.0–34.0)
MCHC: 33.3 g/dL (ref 30.0–36.0)
MCV: 86.1 fL (ref 80.0–100.0)
Platelets: 324 10*3/uL (ref 150–400)
RBC: 2.96 MIL/uL — ABNORMAL LOW (ref 4.22–5.81)
RDW: 14.6 % (ref 11.5–15.5)
WBC: 19.1 10*3/uL — ABNORMAL HIGH (ref 4.0–10.5)
nRBC: 0 % (ref 0.0–0.2)

## 2021-01-31 LAB — C-REACTIVE PROTEIN: CRP: 3.3 mg/dL — ABNORMAL HIGH (ref <1.0)

## 2021-01-31 MED ORDER — LIDOCAINE HCL (PF) 1 % IJ SOLN: 5.0000 mL | INTRAMUSCULAR | Status: DC | PRN

## 2021-01-31 MED ORDER — HEPARIN SODIUM (PORCINE) 1000 UNIT/ML DIALYSIS
1000.0000 [IU] | INTRAMUSCULAR | Status: DC | PRN
  Filled 2021-01-31: qty 1

## 2021-01-31 MED ORDER — SODIUM CHLORIDE 0.9 % IV SOLN: 100.0000 mL | INTRAVENOUS | Status: DC | PRN

## 2021-01-31 MED ORDER — APIXABAN 5 MG PO TABS
5.0000 mg | ORAL_TABLET | Freq: Two times a day (BID) | ORAL | Status: DC
  Administered 2021-01-31 – 2021-02-01 (×3): 5 mg
  Filled 2021-01-31 (×3): qty 1

## 2021-01-31 MED ORDER — ALTEPLASE 2 MG IJ SOLR: 2.0000 mg | Freq: Once | INTRAMUSCULAR | Status: DC | PRN

## 2021-01-31 MED ORDER — LIDOCAINE-PRILOCAINE 2.5-2.5 % EX CREA
1.0000 | TOPICAL_CREAM | CUTANEOUS | Status: DC | PRN
Start: 2021-01-31 — End: 2021-01-31

## 2021-01-31 MED ORDER — LORAZEPAM 0.5 MG PO TABS
0.5000 mg | ORAL_TABLET | Freq: Two times a day (BID) | ORAL | Status: DC
  Administered 2021-01-31 – 2021-02-04 (×8): 0.5 mg
  Filled 2021-01-31 (×8): qty 1

## 2021-01-31 MED ORDER — PENTAFLUOROPROP-TETRAFLUOROETH EX AERO: 1.0000 "application " | INHALATION_SPRAY | CUTANEOUS | Status: DC | PRN

## 2021-01-31 MED ORDER — LORAZEPAM 2 MG/ML IJ SOLN
0.5000 mg | Freq: Once | INTRAMUSCULAR | Status: AC
  Administered 2021-02-01: 0.5 mg via INTRAVENOUS
  Filled 2021-01-31: qty 1

## 2021-01-31 MED ORDER — LEVETIRACETAM 100 MG/ML PO SOLN
500.0000 mg | Freq: Two times a day (BID) | ORAL | Status: DC
  Administered 2021-01-31 – 2021-02-04 (×10): 500 mg
  Filled 2021-01-31 (×11): qty 5

## 2021-01-31 NOTE — Progress Notes (Signed)
ANTICOAGULATION CONSULT NOTE - Initial Consult  Pharmacy Consult for apixaban dosing Indication: DVT  No Known Allergies  Patient Measurements: Height: 5' 8"  (172.7 cm) Weight: 100.8 kg (222 lb 3.6 oz) IBW/kg (Calculated) : 68.4  Vital Signs: Temp: 98.2 F (36.8 C) (09/24 1144) Temp Source: Oral (09/24 1144) BP: 114/97 (09/24 1144) Pulse Rate: 101 (09/24 1144)  Labs: Recent Labs    01/29/21 1233 01/30/21 1937 01/30/21 1959 01/31/21 0421 01/31/21 0814  HGB 9.2* 9.2*  --   --  8.5*  HCT 27.3* 26.7*  --   --  25.5*  PLT 344 381  --   --  324  CREATININE 2.16*  --  4.44* 4.63*  --     Estimated Creatinine Clearance: 18.6 mL/min (A) (by C-G formula based on SCr of 4.63 mg/dL (H)).   Medical History: Past Medical History:  Diagnosis Date   Acute ischemic left MCA stroke ([place]) 04/09/2014   Acute respiratory failure with hypoxia ([place]) 04/09/2014   Aspiration pneumonia ([place])    Asthma    as a child   Brain tumor ([place])    frontal   Cancer ([place])    Confusion    occasionally   Diabetes mellitus without complication ([place])    takes Metformin daily.   Dizziness    Dyspnea    pt states d/t weight   Epilepsy ([place])    takes Keppra daily   GERD (gastroesophageal reflux disease)    takes Omeprazole daily   Headache    Hyperlipidemia    takes Atorvastatin daily   Hypertension    takes Lotrel daily   Peripheral edema    takes Lasix daily   Peripheral neuropathy    takes Gabapentin daily   Pneumonia 3 yrs ago   hx of   Seizures (HCC)    Sleep apnea    Urinary frequency     Medications:  Scheduled:   apixaban  5 mg Per Tube BID   chlorhexidine gluconate (MEDLINE KIT)  15 mL Mouth Rinse BID   Chlorhexidine Gluconate Cloth  6 each Topical Q0600   feeding supplement (NEPRO CARB STEADY)  1,000 mL Per Tube Q24H   feeding supplement (PROSource TF)  45 mL Per Tube BID   fiber  1 packet Per Tube BID   hydrocerin   Topical BID   insulin aspart  0-15 Units Subcutaneous  Q4H   insulin detemir  10 Units Subcutaneous Daily   levETIRAcetam  500 mg Per Tube BID   LORazepam  0.5 mg Per Tube BID   mouth rinse  15 mL Mouth Rinse 10 times per day   multivitamin  1 tablet Per Tube QHS   scopolamine  1 patch Transdermal Q72H   Infusions:   sodium chloride Stopped (12/20/20 0955)   anticoagulant sodium citrate     fluconazole (DIFLUCAN) IV Stopped (01/30/21 1740)    Assessment: Pt diagnosed with new RLE DVT on 8/5 and was started on apixaban for treatment. On 9/22, patient was switched to SQ heparin for prophylaxis for unknown reason. Now consulted to resume treatment with apixaban. Hgb 8.5, HCT 25.5, PLT 324. No documented signs of bleeding.   Goal of Therapy:   Monitor platelets by anticoagulation protocol: Yes   Plan:  Restart prior dose of apixaban 17m BID.  Monitor CBC and signs/sx of bleeding.   KDonald Pore9/24/2022,2:31 PM

## 2021-01-31 NOTE — Plan of Care (Signed)
  Problem: Education: Goal: Knowledge of General Education information will improve Description Including pain rating scale, medication(s)/side effects and non-pharmacologic comfort measures Outcome: Progressing   

## 2021-01-31 NOTE — Progress Notes (Signed)
Pt very restless and agitated during the day per report. During shift assessment, pt stated he was upset because of missing a football game earlier. RN provided emotional support for patient and he was able to calm down. RN then spoke to wife on the phone. Pt wife informed RN she believed dialudid was causing AMS, hallucinations and agitation in patient

## 2021-01-31 NOTE — Progress Notes (Addendum)
PROGRESS NOTE    Thomas [name]  KPV:[id number] DOB: [birth year] DOA: 11/30/2020 PCP: [name], NP    Brief Narrative:  Thomas [name] was admitted to the hospital with the working diagnosis of septic shock, due to mucositis in the setting of colon cancer sp resection.   65 year old male past medical history for colon cancer s/p resection, colostomy May 2022, had 3 cycles of chemotherapy and 18 November 2020.  He also has type 2 diabetes mellitus, hypertension and dyslipidemia.  Prolonged hospitalization.  Initial presentation with abdominal pain and sepsis syndrome, complicated with septic shock, source mucositis combined with cepacitabine toxicity.  His hospital stay was complicated by atrial fibrillation with rapid ventricular response, Pseudomonas aspiration pneumonia with respiratory failure requiring mechanical ventilation.   Developed ventilator dependent respiratory failure, failed extubation 8/8, underwent tracheostomy 8/11.  Progressive renal failure to end-stage renal disease, now on renal replacement therapy   Assessment & Plan:   Principal Problem:   Diabetes mellitus with hyperosmolarity without hyperglycemic hyperosmolar nonketotic coma (Thomas [name]) Active Problems:   Lactic acidosis   Acute renal failure superimposed on stage 2 chronic kidney disease (HCC)   Metabolic acidosis   Tachycardia   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 (HCC)   Adverse effect of chemotherapy   Pressure injury of skin   Atrial fibrillation with RVR (HCC)   Sepsis (HCC)   Malnutrition of moderate degree   Acute metabolic encephalopathy   Acute respiratory failure with hypoxemia (HCC)   Acute renal failure with acute cortical necrosis (HCC)   Shock ([place])   Status post tracheostomy ([place])   Endotracheal tube present   Hemodialysis status ([place])   Palliative care by specialist   SOB (shortness of breath)   Acute hypoxemic respiratory failure, aspiration pseudomonas pneumonia/  tracheobronchitis. Now sp tracheostomy (trach 08/08, 8/22, 08/29) due ventilator dependent respiratory failure.  Patient has a Actuary valve in place with good toleration, low oxygen requirement using 5 L per trach collar. No dyspnea.  Continue PT and OT.  Bronchodilator therapy with albuterol.   2, Urinary tract infection due to yeast, urinary retention. Patient has been placed on fluconazole.  Blood cultures are no growth.  Positive pyuria but not clear about urinary symptoms, will plan for a short course antifungal therapy.   3.  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  Patient had a HD catheter placed on 08.15.22 on the left IJ. Continue renal replacement therapy per nephrology recommendations.   4. Acute blood loss anemia, anemia of chronic disease with iron deficiency.  Her Hgb is stable at 8,5 and hct at 25.5  No indication for PRBC transfusion   5. Stage 3b colon cancer sp colectomy and colostomy on 05/22. Thrombocytopenia. Ileus has resolved.  Abdominal pain has improved today, CT abdomen and pelvis with no acute changes Will resume tube feedings.  Wbc is 19,1 down from 23,9.   6. Right lower extremity DVT Continue anticoagulation with apixaban.   7. Atrial fibrillation. HTN. Rate controlled. Not on anticoagulation   8. Metabolic encephalopathy with delirium. Moderate calorie protein malnutrition, swallow dysfunction. 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 Follow with speech therapy, if continue to have high risk for aspiration, will consider PEG tube placement.  Continue with keppra.   8. T2DM, dyslipidemia fasting glucose this am 144. Will continue glucose cover and monitoring with insulin sliding scale. Basal insulin 10 units glargine.  Continue with statin therapy.  Status is: Inpatient  Remains inpatient appropriate because:Inpatient level of care appropriate due to severity of  illness  Dispo: The patient is from: Home              Anticipated d/c is to: SNF              Patient currently is not medically stable to d/c.   Difficult to place patient No   DVT prophylaxis: Apixban   Code Status:    full  Family Communication:  No family at the bedside      Nutrition Status: Nutrition Problem: Moderate Malnutrition Etiology: chronic illness, cancer and cancer related treatments Signs/Symptoms: mild fat depletion, mild muscle depletion, percent weight loss Percent weight loss: 11 % Interventions: Boost Breeze, Prostat, MVI     Subjective: Patient with no nausea or vomiting, no dyspnea, no abdominal pain, continue to be very weak and deconditioned.   Objective: Vitals:   01/31/21 1000 01/31/21 1030 01/31/21 1050 01/31/21 1144  BP: 114/69 99/76 138/80 (!) 114/97  Pulse: 100 100 94 (!) 101  Resp:   (!) 22 15  Temp:   97.7 F (36.5 C) 98.2 F (36.8 C)  TempSrc:   Oral Oral  SpO2:   98% 100%  Weight:   100.8 kg   Height:        Intake/Output Summary (Last 24 hours) at 01/31/2021 1210 Last data filed at 01/31/2021 1050 Gross per 24 hour  Intake 1211.43 ml  Output 1650 ml  Net -438.57 ml   Filed Weights   01/30/21 0500 01/31/21 0806 01/31/21 1050  Weight: 103.1 kg 102.3 kg 100.8 kg    Examination:   General: Not in pain or dyspnea, deconditioned  Neurology: Awake and alert, non focal  E ENT: no pallor, no icterus, oral mucosa moist. Trach in place.  Cardiovascular: No JVD. S1-S2 present, rhythmic, no gallops, rubs, or murmurs. No lower extremity edema. Pulmonary: positive breath sounds bilaterally,with no wheezing, rhonchi or rales. Gastrointestinal. Abdomen soft and non tender. Ostomy in place.  Skin. No rashes Musculoskeletal: no joint deformities     Data Reviewed: I have personally reviewed following labs and imaging studies  CBC: Recent Labs  Lab 01/26/21 1222 01/27/21 0734 01/28/21 0247 01/29/21 0751 01/29/21 1233  01/30/21 1937 01/31/21 0814  WBC 18.5*   < > 14.7* 14.1* 18.0* 23.9* 19.1*  NEUTROABS 14.9*  --   --   --  14.4* 16.9*  --   HGB 9.1*   < > 9.3* 8.3* 9.2* 9.2* 8.5*  HCT 26.8*   < > 27.3* 24.4* 27.3* 26.7* 25.5*  MCV 84.8   < > 84.8 85.6 86.4 84.5 86.1  PLT 319   < > 285 309 344 381 324   < > = values in this interval not displayed.   Basic Metabolic Panel: Recent Labs  Lab 01/28/21 0247 01/29/21 0211 01/29/21 0751 01/29/21 1233 01/30/21 1959 01/31/21 0421  NA 133* 131* 133* 134* 134* 134*  K 4.1 3.8 3.8 3.7 4.2 4.0  CL 94* 92* 92* 95* 98 98  CO2 24 24 26 25  19* 23  GLUCOSE 166* 171* 161* 210* 127* 144*  BUN 49* 72* 77* 31* 54* 58*  CREATININE 3.19* 3.90* 4.01* 2.16* 4.44* 4.63*  CALCIUM 8.8* 9.2 9.2 8.6* 9.4 9.2  MG 2.2  --   --   --   --   --   PHOS 5.2* 6.2* 6.1*  --  3.0 5.8*   GFR: Estimated Creatinine Clearance: 18.6  mL/min (A) (by C-G formula based on SCr of 4.63 mg/dL (H)). Liver Function Tests: Recent Labs  Lab 01/29/21 0211 01/29/21 0751 01/29/21 1233 01/30/21 1959 01/31/21 0421  AST  --   --  25  --   --   ALT  --   --  31  --   --   ALKPHOS  --   --  116  --   --   BILITOT  --   --  0.6  --   --   PROT  --   --  7.9  --   --   ALBUMIN 2.5* 2.5* 2.7* 2.8* 2.6*   No results for input(s): LIPASE, AMYLASE in the last 168 hours. No results for input(s): AMMONIA in the last 168 hours. Coagulation Profile: No results for input(s): INR, PROTIME in the last 168 hours. Cardiac Enzymes: No results for input(s): CKTOTAL, CKMB, CKMBINDEX, TROPONINI in the last 168 hours. BNP (last 3 results) No results for input(s): PROBNP in the last 8760 hours. HbA1C: No results for input(s): HGBA1C in the last 72 hours. CBG: Recent Labs  Lab 01/30/21 1624 01/30/21 2045 01/30/21 2321 01/31/21 0359 01/31/21 1127  GLUCAP 121* 134* 127* 133* 135*   Lipid Profile: No results for input(s): CHOL, HDL, LDLCALC, TRIG, CHOLHDL, LDLDIRECT in the last 72 hours. Thyroid  Function Tests: No results for input(s): TSH, T4TOTAL, FREET4, T3FREE, THYROIDAB in the last 72 hours. Anemia Panel: No results for input(s): VITAMINB12, FOLATE, FERRITIN, TIBC, IRON, RETICCTPCT in the last 72 hours.    Radiology Studies: I have reviewed all of the imaging during this hospital visit personally     Scheduled Meds:  chlorhexidine gluconate (MEDLINE KIT)  15 mL Mouth Rinse BID   Chlorhexidine Gluconate Cloth  6 each Topical Q0600   feeding supplement (NEPRO CARB STEADY)  1,000 mL Per Tube Q24H   feeding supplement (PROSource TF)  45 mL Per Tube BID   fiber  1 packet Per Tube BID   heparin injection (subcutaneous)  5,000 Units Subcutaneous Q8H   hydrocerin   Topical BID   insulin aspart  0-15 Units Subcutaneous Q4H   insulin detemir  10 Units Subcutaneous Daily   LORazepam  0.5 mg Intravenous Q12H   mouth rinse  15 mL Mouth Rinse 10 times per day   multivitamin  1 tablet Per Tube QHS   scopolamine  1 patch Transdermal Q72H   Continuous Infusions:  sodium chloride Stopped (12/20/20 0955)   anticoagulant sodium citrate     fluconazole (DIFLUCAN) IV Stopped (01/30/21 1740)   levETIRAcetam Stopped (01/31/21 0333)     LOS: 61 days        Javaya [place] Gerome Apley, MD

## 2021-01-31 NOTE — Progress Notes (Addendum)
HOSPITAL MEDICINE OVERNIGHT EVENT NOTE    Notified by nursing that patient began to experience severe nausea and several bouts of vomiting shortly after administering a dose of Dilaudid for pain.  Family has voiced their dissatisfaction with administration of Dilaudid, they report the patient is quite allergic to morphine with "throat swelling" as the reported reaction.  Nursing is already administered Zofran and attempted to administer Ativan via the NG tube prior to the patient beginning to vomit.  Patient has already since vomited 3 times.  Due to concerns for possible Dilaudid allergy, will discontinue Dilaudid.  Since patient has vomited despite the Zofran we will give a dose of intravenous Ativan which can serve as an antiemetic and anxiolytic simultaneous; the patient likely vomited what ever Ativan was given to him via NG tube earlier.  Monitoring for symptomatic improvement.  Vernelle Emerald  MD [facility]   ADDENDUM (9/25 4:30am)  Administration of Ativan yesterday evening resulted in substantial improvement in patient's symptoms of nausea.  Patient slept for several hours.  However, patient has now woken up and is complaining of lower abdominal pain.  Nursing reports that patient seems to have left lower quadrant tenderness on exam.  Abdomen is soft however.    Chart reviewed, patient underwent CT imaging of the abdomen and pelvis evening of 9/23 which revealed no evidence of intra-abdominal pathology.   Review of the chart also reveals the patient is being actively treated for a urinary tract infection with funguria.    Despite the patient having a history of end-stage renal disease, patient seems to still urinate and I asked nursing to obtain a bladder scan which showed minimal urine in the bladder.We will obtain a repeat urinalysis and urine culture at this time.  Patient requesting Tylenol for lower abdominal discomfort.  Tylenol elixir placed to be administered via  feeding tube.  Sherryll Burger Ruhee Enck

## 2021-01-31 NOTE — Progress Notes (Signed)
Pt vomited x 3, clear liquid. Pt TF held and zofran administered after first episode but no relief. Pt anxious and restless. BP 119/85 R-30s [id number] [id number] Dr. Marlyce Huge paged.

## 2021-01-31 NOTE — Progress Notes (Signed)
[facility] ROUNDING NOTE   Subjective:   Interval History: 65 year old gentleman with a history of colon cancer status postresection and colostomy May 2022.  Status post 3 cycles of chemotherapy last was in July 2022.  Type 2 diabetes with neuropathy hypertension hyperlipidemia presented to the emergency room 11/30/2020 with abdominal pain.  Was found to be secondary to sepsis.  Patient also was found to have acute kidney injury.  Developed atrial fibrillation with rapid ventricular rate and was admitted to intensive care unit for septic shock.  On 12/03/2020 he developed metabolic encephalopathy with hypoxia and aspiration pneumonia and required intubation 12/06/2020.  He also required pressor support.  He was reintubated since 12/15/2020 tracheostomy placed.  His hospital course was complicated by DVT.  It appears that he has recurrent Pseudomonas pneumonia.  He also has recurrent anemia requiring transfusion of 4 units of packed red blood cells.  Blood pressure 102/68 pulse 93 temperature 98 O2 sats 100 % trach  Sodium 134 potassium 4 chloride 98 CO2 23 BUN 58 creatinine 4.63 glucose 144 calcium 9.2 phosphorus 5.2 albumin 2.6 hemoglobin 8.5  Minimal urine output although did have 275 cc 01/30/2021.  Patient is receiving dialysis 01/31/2021  Objective:  Vital signs in last 24 hours:  Temp:  [98 F (36.7 C)-98.1 F (36.7 C)] 98 F (36.7 C) (09/24 0806) Pulse Rate:  [89-107] 92 (09/24 0900) Resp:  [13-33] 14 (09/24 0806) BP: (102-162)/(68-86) 102/68 (09/24 0900) SpO2:  [97 %-100 %] 98 % (09/24 0806) FiO2 (%):  [28 %] 28 % (09/24 0427) Weight:  [102.3 kg] 102.3 kg (09/24 0806)  Weight change:  Filed Weights   01/29/21 1110 01/30/21 0500 01/31/21 0806  Weight: 100.5 kg 103.1 kg 102.3 kg    Intake/Output: I/O last 3 completed shifts: In: 1211.4 [I.V.:661.4; IV Piggyback:550] Out: 350 [Urine:350]   Intake/Output this shift:  No intake/output data recorded.  General:  Ill-looking male, with tracheostomy on trach collar, not in distress Heart:RRR, s1s2 nl Lungs: normal wob Abdomen:soft, Non-tender, non-distended Extremities: No edema. Neurology: Alert awake and following commands. Dialysis Access: [facility] in place.    Basic Metabolic Panel: Recent Labs  Lab 01/28/21 0247 01/29/21 0211 01/29/21 0751 01/29/21 1233 01/30/21 1959 01/31/21 0421  NA 133* 131* 133* 134* 134* 134*  K 4.1 3.8 3.8 3.7 4.2 4.0  CL 94* 92* 92* 95* 98 98  CO2 24 24 26 25  19* 23  GLUCOSE 166* 171* 161* 210* 127* 144*  BUN 49* 72* 77* 31* 54* 58*  CREATININE 3.19* 3.90* 4.01* 2.16* 4.44* 4.63*  CALCIUM 8.8* 9.2 9.2 8.6* 9.4 9.2  MG 2.2  --   --   --   --   --   PHOS 5.2* 6.2* 6.1*  --  3.0 5.8*     Liver Function Tests: Recent Labs  Lab 01/29/21 0211 01/29/21 0751 01/29/21 1233 01/30/21 1959 01/31/21 0421  AST  --   --  25  --   --   ALT  --   --  31  --   --   ALKPHOS  --   --  116  --   --   BILITOT  --   --  0.6  --   --   PROT  --   --  7.9  --   --   ALBUMIN 2.5* 2.5* 2.7* 2.8* 2.6*    No results for input(s): LIPASE, AMYLASE in the last 168 hours. No results for input(s): AMMONIA in the last  168 hours.  CBC: Recent Labs  Lab 01/26/21 1222 01/27/21 0734 01/28/21 0247 01/29/21 0751 01/29/21 1233 01/30/21 1937 01/31/21 0814  WBC 18.5*   < > 14.7* 14.1* 18.0* 23.9* 19.1*  NEUTROABS 14.9*  --   --   --  14.4* 16.9*  --   HGB 9.1*   < > 9.3* 8.3* 9.2* 9.2* 8.5*  HCT 26.8*   < > 27.3* 24.4* 27.3* 26.7* 25.5*  MCV 84.8   < > 84.8 85.6 86.4 84.5 86.1  PLT 319   < > 285 309 344 381 324   < > = values in this interval not displayed.     Cardiac Enzymes: No results for input(s): CKTOTAL, CKMB, CKMBINDEX, TROPONINI in the last 168 hours.  BNP: Invalid input(s): POCBNP  CBG: Recent Labs  Lab 01/30/21 1108 01/30/21 1624 01/30/21 2045 01/30/21 2321 01/31/21 0359  GLUCAP 122* 121* 134* 127* 133*     Microbiology: Results for orders  placed or performed during the hospital encounter of 11/30/20  Resp Panel by RT-PCR (Flu A&B, Covid) Nasopharyngeal Swab     Status: None   Collection Time: 11/30/20 10:05 PM   Specimen: Nasopharyngeal Swab; Nasopharyngeal(NP) swabs in vial transport medium  Result Value Ref Range Status   SARS Coronavirus 2 by RT PCR NEGATIVE NEGATIVE Final    Comment: (NOTE) SARS-CoV-2 target nucleic acids are NOT DETECTED.  The SARS-CoV-2 RNA is generally detectable in upper respiratory specimens during the acute phase of infection. The lowest concentration of SARS-CoV-2 viral copies this assay can detect is 138 copies/mL. A negative result does not preclude SARS-Cov-2 infection and should not be used as the sole basis for treatment or other patient management decisions. A negative result may occur with  improper specimen collection/handling, submission of specimen other than nasopharyngeal swab, presence of viral mutation(s) within the areas targeted by this assay, and inadequate number of viral copies(<138 copies/mL). A negative result must be combined with clinical observations, patient history, and epidemiological information. The expected result is Negative.  Fact Sheet for Patients:  EntrepreneurPulse.com.au  Fact Sheet for Healthcare Providers:  IncredibleEmployment.be  This test is no t yet approved or cleared by the [place] FDA and  has been authorized for detection and/or diagnosis of SARS-CoV-2 by FDA under an Emergency Use Authorization (EUA). This EUA will remain  in effect (meaning this test can be used) for the duration of the COVID-19 declaration under Section 564(b)(1) of the Act, 21 U.S.C.section 360bbb-3(b)(1), unless the authorization is terminated  or revoked sooner.       Influenza A by PCR NEGATIVE NEGATIVE Final   Influenza B by PCR NEGATIVE NEGATIVE Final    Comment: (NOTE) The Xpert Xpress SARS-CoV-2/FLU/RSV plus assay is  intended as an aid in the diagnosis of influenza from Nasopharyngeal swab specimens and should not be used as a sole basis for treatment. Nasal washings and aspirates are unacceptable for Xpert Xpress SARS-CoV-2/FLU/RSV testing.  Fact Sheet for Patients: EntrepreneurPulse.com.au  Fact Sheet for Healthcare Providers: IncredibleEmployment.be  This test is not yet approved or cleared by the [place] FDA and has been authorized for detection and/or diagnosis of SARS-CoV-2 by FDA under an Emergency Use Authorization (EUA). This EUA will remain in effect (meaning this test can be used) for the duration of the COVID-19 declaration under Section 564(b)(1) of the Act, 21 U.S.C. section 360bbb-3(b)(1), unless the authorization is terminated or revoked.  Performed at [facility], [place] [address], [place], [place] [address]  Blood Culture (routine x 2)     Status: None   Collection Time: 11/30/20 10:05 PM   Specimen: BLOOD  Result Value Ref Range Status   Specimen Description   Final    BLOOD LEFT ANTECUBITAL Performed at [place] [address] [place], [place] [address]    Special Requests   Final    BOTTLES DRAWN AEROBIC AND ANAEROBIC Blood Culture results may not be optimal due to an inadequate volume of blood received in culture bottles Performed at [place] [address], [place], [place] [address]    Culture   Final    NO GROWTH 5 DAYS Performed at  [facility], [place] [address], [place], [place] [address]    Report Status 12/06/2020 FINAL  Final  Blood Culture (routine x 2)     Status: None   Collection Time: 11/30/20 10:33 PM   Specimen: BLOOD  Result Value Ref Range Status   Specimen Description   Final    BLOOD RIGHT ANTECUBITAL Performed at [place] [address], [place], [place] [address]    Special Requests   Final    BOTTLES  DRAWN AEROBIC AND ANAEROBIC Blood Culture adequate volume Performed at [place] [address], [place], [place] [address]    Culture   Final    NO GROWTH 5 DAYS Performed at [facility], [place] [address] [place], [place] [address]    Report Status 12/06/2020 FINAL  Final  Urine Culture     Status: None   Collection Time: 12/01/20  2:00 AM   Specimen: In/Out Cath Urine  Result Value Ref Range Status   Specimen Description   Final    IN/OUT CATH URINE Performed at [place] [address], [place], [place] [address]    Special Requests   Final    NONE Performed at [facility], [place] [address] [place], [place] [address]    Culture   Final    NO GROWTH Performed at [facility], [place] [address] [place], [place] [address]    Report Status 12/02/2020 FINAL  Final  MRSA Next Gen by PCR, Nasal     Status: None   Collection Time: 12/01/20  8:28 AM   Specimen: Nasal Mucosa; Nasal Swab  Result Value Ref Range Status   MRSA by PCR Next Gen NOT DETECTED NOT DETECTED Final    Comment: (NOTE) The GeneXpert MRSA Assay (FDA approved for NASAL specimens only), is one component of a comprehensive MRSA colonization surveillance program. It is not intended to diagnose MRSA infection nor to guide or monitor treatment for MRSA infections. Test performance is not FDA approved in patients less than 12 years old. Performed at [facility], [place] [address] [place],  [address]   C Difficile Quick Screen w PCR reflex     Status: None   Collection Time: 12/02/20  4:33 PM   Specimen: STOOL  Result Value Ref Range Status   C Diff antigen NEGATIVE NEGATIVE Final   C Diff toxin NEGATIVE NEGATIVE Final   C Diff interpretation No C. difficile detected.  Final    Comment: Performed at [facility], [place] [address] [place],  [address]  Culture, Respiratory w Gram Stain      Status: None   Collection Time: 12/07/20  8:14 AM   Specimen: Tracheal Aspirate; Respiratory  Result Value Ref Range Status  Specimen Description   Final    TRACHEAL ASPIRATE Performed at [place] [address], [place], [place] [address]    Special Requests   Final    NONE Performed at [facility], [place] [address] [place], [place] [address]    Gram Stain   Final    FEW WBC PRESENT,BOTH PMN AND MONONUCLEAR ABUNDANT YEAST FEW GRAM POSITIVE RODS Performed at [facility], [place] [address], [place], [place] [address]    Culture MODERATE CANDIDA TROPICALIS  Final   Report Status 12/09/2020 FINAL  Final  Gastrointestinal Panel by PCR , Stool     Status: None   Collection Time: 12/08/20  2:05 PM   Specimen: Stool  Result Value Ref Range Status   Campylobacter species NOT DETECTED NOT DETECTED Final   Plesimonas shigelloides NOT DETECTED NOT DETECTED Final   Salmonella species NOT DETECTED NOT DETECTED Final   Yersinia enterocolitica NOT DETECTED NOT DETECTED Final   Vibrio species NOT DETECTED NOT DETECTED Final   Vibrio cholerae NOT DETECTED NOT DETECTED Final   Enteroaggregative E coli (EAEC) NOT DETECTED NOT DETECTED Final   Enteropathogenic E coli (EPEC) NOT DETECTED NOT DETECTED Final   Enterotoxigenic E coli (ETEC) NOT DETECTED NOT DETECTED Final   Shiga like toxin producing E coli (STEC) NOT DETECTED NOT DETECTED Final   Shigella/Enteroinvasive E coli (EIEC) NOT DETECTED NOT DETECTED Final   Cryptosporidium NOT DETECTED NOT DETECTED Final   Cyclospora cayetanensis NOT DETECTED NOT DETECTED Final   Entamoeba histolytica NOT DETECTED NOT DETECTED Final   Giardia lamblia NOT DETECTED NOT DETECTED Final   Adenovirus F40/41 NOT DETECTED NOT DETECTED Final   Astrovirus NOT DETECTED NOT DETECTED Final   Norovirus GI/GII NOT DETECTED NOT DETECTED Final   Rotavirus A NOT DETECTED NOT DETECTED Final   Sapovirus (I, II, IV, and V) NOT  DETECTED NOT DETECTED Final    Comment: Performed at [facility], [place] [place], [place] [address]  Culture, Respiratory w Gram Stain     Status: None   Collection Time: 12/22/20 10:13 AM   Specimen: Tracheal Aspirate; Respiratory  Result Value Ref Range Status   Specimen Description TRACHEAL ASPIRATE  Final   Special Requests NONE  Final   Gram Stain   Final    FEW SQUAMOUS EPITHELIAL CELLS PRESENT FEW WBC SEEN ABUNDANT GRAM POSITIVE COCCI FEW GRAM VARIABLE ROD Performed at [facility], [place] [address], [place], [place] [address]    Culture RARE PSEUDOMONAS AERUGINOSA  Final   Report Status 12/26/2020 FINAL  Final   Organism ID, Bacteria PSEUDOMONAS AERUGINOSA  Final      Susceptibility   Pseudomonas aeruginosa - MIC*    CEFTAZIDIME <=1 SENSITIVE Sensitive     GENTAMICIN <=1 SENSITIVE Sensitive     PIP/TAZO 8 SENSITIVE Sensitive     * RARE PSEUDOMONAS AERUGINOSA  MRSA Next Gen by PCR, Nasal     Status: Abnormal   Collection Time: 01/02/21 11:03 AM   Specimen: Nasal Mucosa; Nasal Swab  Result Value Ref Range Status   MRSA by PCR Next Gen (A) NOT DETECTED Final    INVALID, UNABLE TO DETERMINE THE PRESENCE OF TARGET DUE TO SPECIMEN INTEGRITY. RECOLLECTION REQUESTED.    Comment:        The GeneXpert MRSA Assay (FDA approved for NASAL specimens only), is one component of a comprehensive MRSA colonization surveillance program. It is not intended to diagnose MRSA infection nor to  guide or monitor treatment for MRSA infections. Performed at [facility], [place] [address], [place], [place] [address]   MRSA Next Gen by PCR, Nasal     Status: None   Collection Time: 01/03/21  3:08 AM   Specimen: Nasal Mucosa; Nasal Swab  Result Value Ref Range Status   MRSA by PCR Next Gen NOT DETECTED NOT DETECTED Final    Comment: (NOTE) The GeneXpert MRSA Assay (FDA approved for NASAL specimens only), is one component of a comprehensive MRSA colonization  surveillance program. It is not intended to diagnose MRSA infection nor to guide or monitor treatment for MRSA infections. Test performance is not FDA approved in patients less than 64 years old. Performed at [facility], [place] [address], [place], [place] [address]   Urine Culture     Status: Abnormal   Collection Time: 01/14/21  8:46 AM   Specimen: In/Out Cath Urine  Result Value Ref Range Status   Specimen Description IN/OUT CATH URINE  Final   Special Requests   Final    NONE Performed at [facility], [place] [address], [place], [place] [address]    Culture MULTIPLE SPECIES PRESENT, SUGGEST RECOLLECTION (A)  Final   Report Status 01/15/2021 FINAL  Final  Culture, blood (Routine X 2) w Reflex to ID Panel     Status: None   Collection Time: 01/14/21 11:21 AM   Specimen: BLOOD  Result Value Ref Range Status   Specimen Description BLOOD RIGHT ANTECUBITAL  Final   Special Requests   Final    BOTTLES DRAWN AEROBIC ONLY Blood Culture adequate volume   Culture   Final    NO GROWTH 5 DAYS Performed at [facility], [place] [address], [place], [place] [address]    Report Status 01/19/2021 FINAL  Final  Culture, blood (Routine X 2) w Reflex to ID Panel     Status: None   Collection Time: 01/14/21 11:22 AM   Specimen: BLOOD  Result Value Ref Range Status   Specimen Description BLOOD LEFT ANTECUBITAL  Final   Special Requests   Final    BOTTLES DRAWN AEROBIC ONLY Blood Culture adequate volume   Culture   Final    NO GROWTH 5 DAYS Performed at [facility], [place] [address], [place], [place] [address]    Report Status 01/19/2021 FINAL  Final  Culture, Respiratory w Gram Stain     Status: None   Collection Time: 01/14/21 11:40 AM   Specimen: Tracheal Aspirate; Respiratory  Result Value Ref Range Status   Specimen Description TRACHEAL ASPIRATE  Final   Special Requests NONE  Final   Gram Stain   Final    ABUNDANT WBC PRESENT,BOTH PMN AND MONONUCLEAR FEW SQUAMOUS  EPITHELIAL CELLS PRESENT MODERATE GRAM NEGATIVE RODS FEW GRAM VARIABLE ROD Performed at [facility], [place] [address] [place],  [address]    Culture MODERATE PSEUDOMONAS AERUGINOSA  Final   Report Status 01/18/2021 FINAL  Final   Organism ID, Bacteria PSEUDOMONAS AERUGINOSA  Final      Susceptibility   Pseudomonas aeruginosa - MIC*    CEFTAZIDIME <=1 SENSITIVE Sensitive     CIPROFLOXACIN <=0.25 SENSITIVE Sensitive     GENTAMICIN <=1 SENSITIVE Sensitive     PIP/TAZO <=4 SENSITIVE Sensitive     * MODERATE PSEUDOMONAS AERUGINOSA  Urine Culture     Status: Abnormal   Collection Time: 01/26/21  9:05 AM   Specimen: Urine, Catheterized  Result Value Ref Range Status   Specimen Description URINE, CATHETERIZED  Final   Special Requests   Final    NONE Performed at [facility], [address] [place], [place] [address]    Culture >=100,000 COLONIES/mL YEAST (A)  Final   Report Status 01/29/2021 FINAL  Final    Coagulation Studies: No results for input(s): LABPROT, INR in the last 72 hours.  Urinalysis: No results for input(s): COLORURINE, LABSPEC, PHURINE, GLUCOSEU, HGBUR, BILIRUBINUR, KETONESUR, PROTEINUR, UROBILINOGEN, NITRITE, LEUKOCYTESUR in the last 72 hours.  Invalid input(s): APPERANCEUR     Imaging: CT ABDOMEN PELVIS WO CONTRAST  Result Date: 01/30/2021 CLINICAL DATA:  Abdominal pain.  Bowel obstruction suspected. EXAM: CT ABDOMEN AND PELVIS WITHOUT CONTRAST TECHNIQUE: Multidetector CT imaging of the abdomen and pelvis was performed following the standard protocol without IV contrast. COMPARISON:  Prior radiographs.  Most recent CT 01/26/2021 FINDINGS: Lower chest: Subsegmental atelectasis in both lower lobes. No pleural fluid. Hepatobiliary: Focal liver abnormality on this unenhanced exam. Gallbladder physiologically distended, no calcified stone. No biliary dilatation. Pancreas: No ductal dilatation or inflammation. Spleen: Normal in size without focal  abnormality. Splenules inferiorly. Adrenals/Urinary Tract: Normal adrenal glands. No hydronephrosis or renal calculi. Mild bilateral perinephric edema. Again seen cyst in the mid left kidney. Decompressed urinary bladder by Foley catheter. Stomach/Bowel: Transverse colectomy with right upper quadrant ostomy. The descending and sigmoid colon remain in on-site 2 and are unchanged in appearance, with intraluminal fluid. No associated inflammation. This is similar in appearance to prior exam. The stomach is decompressed. Feeding tube tip in the proximal duodenum. No obstruction or small bowel inflammation. Vascular/Lymphatic: Aorto bi-iliac atherosclerosis. No aortic aneurysm. Retroaortic left renal vein. Portal venous or mesenteric gas. No bulky abdominopelvic adenopathy. Reproductive: Prostate is unremarkable. Other: No ascites. No free air. No evidence of focal fluid collection or abscess. Fat containing inguinal hernias. Postsurgical change of the anterior abdominal wall without abdominal wall collection. Musculoskeletal: Stable osseous structures. IMPRESSION: 1. No bowel obstruction or acute abnormality in the abdomen/pelvis. No abscess. 2. Stable postsurgical change of the transverse colon. 3. Improving basilar opacities from prior abdominal CT, favoring atelectasis. Aortic Atherosclerosis (ICD10-I70.0). Electronically Signed   By: Keith Rake M.D.   On: 01/30/2021 22:33   DG CHEST PORT 1 VIEW  Result Date: 01/29/2021 CLINICAL DATA:  Acute abdominal pain EXAM: PORTABLE CHEST 1 VIEW COMPARISON:  Chest x-ray dated January 22, 2021 FINDINGS: Right-sided port, left dialysis catheter, ET tube, and enteric tube are unchanged in position. Mild bibasilar atelectasis. Lungs are otherwise clear. No large pleural effusion or evidence of pneumothorax. IMPRESSION: Bibasilar atelectasis.  Lungs otherwise clear. Stable support devices. Electronically Signed   By: Yetta Glassman M.D.   On: 01/29/2021 14:09   DG Abd  2 Views  Result Date: 01/30/2021 CLINICAL DATA:  Abdominal pain, small-bowel obstruction EXAM: ABDOMEN - 2 VIEW COMPARISON:  01/29/2021 abdominal radiograph FINDINGS: Right lateral abdominal colostomy bag noted. Mildly dilated small bowel loops in the right abdomen up to 3.7 cm diameter with air-fluid level on the decubitus view, not appreciably changed. No evidence of pneumatosis or pneumoperitoneum. No radiopaque nephrolithiasis. Mild lumbar spondylosis. Enteric tube tip in the distal stomach/pyloric region. IMPRESSION: No appreciable change in mildly dilated small bowel loops in the right abdomen, compatible with partial distal small bowel obstruction. Enteric tube tip in the distal stomach/pyloric region. Electronically Signed   By: Ilona Sorrel M.D.   On: 01/30/2021 11:08   DG Abd 2 Views  Result Date:  01/29/2021 CLINICAL DATA:  Acute abdominal pain, history of right abdominal ostomy EXAM: ABDOMEN - 2 VIEW COMPARISON:  01/22/2021 abdominal radiograph FINDINGS: Enteric tube courses through the stomach with tip in the right upper quadrant likely in the region of the pylorus. A few mildly dilated bowel loops are present in the right abdomen up to the 3.9 cm diameter with associated air-fluid levels on the decubitus view. Gasless left abdomen. Colostomy noted in the lateral right abdomen. No evidence of pneumatosis or pneumoperitoneum. IMPRESSION: 1. Enteric tube courses through the stomach with tip in the right upper quadrant likely in the region of the pylorus. 2. No evidence of pneumoperitoneum. 3. Mildly dilated bowel loops in the right abdomen with associated air-fluid levels, differential includes mild partial distal small bowel obstruction versus adynamic ileus. Electronically Signed   By: Ilona Sorrel M.D.   On: 01/29/2021 13:27     Medications:    sodium chloride Stopped (12/20/20 0955)   sodium chloride     sodium chloride     anticoagulant sodium citrate     dextrose 42 mL/hr at 01/31/21  0441   fluconazole (DIFLUCAN) IV Stopped (01/30/21 1740)   levETIRAcetam Stopped (01/31/21 0333)    chlorhexidine gluconate (MEDLINE KIT)  15 mL Mouth Rinse BID   Chlorhexidine Gluconate Cloth  6 each Topical Q0600   feeding supplement (NEPRO CARB STEADY)  1,000 mL Per Tube Q24H   feeding supplement (PROSource TF)  45 mL Per Tube BID   fiber  1 packet Per Tube BID   heparin injection (subcutaneous)  5,000 Units Subcutaneous Q8H   hydrocerin   Topical BID   insulin aspart  0-15 Units Subcutaneous Q4H   insulin detemir  10 Units Subcutaneous Daily   LORazepam  0.5 mg Intravenous Q12H   mouth rinse  15 mL Mouth Rinse 10 times per day   multivitamin  1 tablet Per Tube QHS   scopolamine  1 patch Transdermal Q72H   sodium chloride, sodium chloride, sodium chloride, albuterol, alteplase, dextrose, glycopyrrolate, heparin, HYDROmorphone (DILAUDID) injection, lidocaine (PF), lidocaine-prilocaine, lip balm, LORazepam, metoprolol tartrate, ondansetron (ZOFRAN) IV, pentafluoroprop-tetrafluoroeth, sodium chloride flush  Assessment/ Plan:  1.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 CRRT initiated 7/31 and transitioned to IHD on 12/19/20.  S/p [facility] placement 12/22/20.  No signs of meaningful recovery last dialysis was 01/27/2021 Next dialysis will be 01/29/2021.  We will closely monitor for signs of recovery.  There does seem to be some improvement in urine output.  Patient is receiving dialysis 01/31/2021    2.Urinary retention   Foley catheter placed   3.Volume overload/anasarca-improved with dialysis.  4.Sepsis/aspiration pneumonia/VDRF:aspiration pneumonia.  Tracheostomy placed  4.Stage IIIB colon cancer - sp partial colectomy, colostomy. On chemoRx sp 3 cycles.   5.Right leg DVT: On Eliquis.  6.Atrial fibrillation - on amiodarone and anticoagulation.    7.Anemia of critical illness: DC'ed  IV iron as concern for infection  and on abx, transfuse as needed, holding erythropoietin with active malignancy.  Monitor hemoglobin and transfuse as needed.  8. CKD-MBD; started sevelamer for hyperphosphatemia on 9/8, monitor phosphorus level.--improved   9. Disposition - poor overall prognosis.  Seen by palliative care team.  Discussion ongoing for possible transfer to [facility].  The tracheostomy should be capped before he can be accepted to [facility]    LOS: [place] @TODAY @9 :04 AM

## 2021-01-31 NOTE — Plan of Care (Signed)

## 2021-02-01 DIAGNOSIS — J9601 Acute respiratory failure with hypoxia: Secondary | ICD-10-CM | POA: Diagnosis not present

## 2021-02-01 DIAGNOSIS — N171 Acute kidney failure with acute cortical necrosis: Secondary | ICD-10-CM | POA: Diagnosis not present

## 2021-02-01 DIAGNOSIS — G9341 Metabolic encephalopathy: Secondary | ICD-10-CM | POA: Diagnosis not present

## 2021-02-01 DIAGNOSIS — E1100 Type 2 diabetes mellitus with hyperosmolarity without nonketotic hyperglycemic-hyperosmolar coma (NKHHC): Secondary | ICD-10-CM | POA: Diagnosis not present

## 2021-02-01 LAB — GLUCOSE, CAPILLARY
Glucose-Capillary: 172 mg/dL — ABNORMAL HIGH (ref 70–99)
Glucose-Capillary: 178 mg/dL — ABNORMAL HIGH (ref 70–99)
Glucose-Capillary: 181 mg/dL — ABNORMAL HIGH (ref 70–99)
Glucose-Capillary: 184 mg/dL — ABNORMAL HIGH (ref 70–99)
Glucose-Capillary: 185 mg/dL — ABNORMAL HIGH (ref 70–99)
Glucose-Capillary: 92 mg/dL (ref 70–99)

## 2021-02-01 LAB — RENAL FUNCTION PANEL
Albumin: 2.7 g/dL — ABNORMAL LOW (ref 3.5–5.0)
Anion gap: 15 (ref 5–15)
BUN: 39 mg/dL — ABNORMAL HIGH (ref 8–23)
CO2: 23 mmol/L (ref 22–32)
Calcium: 9.3 mg/dL (ref 8.9–10.3)
Chloride: 96 mmol/L — ABNORMAL LOW (ref 98–111)
Creatinine, Ser: 4.05 mg/dL — ABNORMAL HIGH (ref 0.61–1.24)
GFR, Estimated: 16 mL/min — ABNORMAL LOW (ref >60)
Glucose, Bld: 195 mg/dL — ABNORMAL HIGH (ref 70–99)
Phosphorus: 4.3 mg/dL (ref 2.5–4.6)
Potassium: 3.7 mmol/L (ref 3.5–5.1)
Sodium: 134 mmol/L — ABNORMAL LOW (ref 135–145)

## 2021-02-01 MED ORDER — ACETAMINOPHEN 160 MG/5ML PO SOLN
650.0000 mg | Freq: Four times a day (QID) | ORAL | Status: DC | PRN
  Administered 2021-02-01 – 2021-02-09 (×9): 650 mg via ORAL
  Filled 2021-02-01 (×9): qty 20.3

## 2021-02-01 MED ORDER — SODIUM BICARBONATE 650 MG PO TABS
650.0000 mg | ORAL_TABLET | Freq: Once | ORAL | Status: AC
  Administered 2021-02-02: 650 mg
  Filled 2021-02-01: qty 1

## 2021-02-01 MED ORDER — PANCRELIPASE (LIP-PROT-AMYL) 10440-39150 UNITS PO TABS
20880.0000 [IU] | ORAL_TABLET | Freq: Once | ORAL | Status: AC
  Administered 2021-02-02: 20880 [IU]
  Filled 2021-02-01: qty 2

## 2021-02-01 NOTE — Progress Notes (Addendum)
PROGRESS NOTE    Thomas [name]  YQM:[id number] DOB: 1955-12-17 DOA: 11/30/2020 PCP: [name], NP    Brief Narrative:  [name] was admitted to the hospital with the working diagnosis of septic shock, due to mucositis in the setting of colon cancer sp resection.    65 year old male past medical history for colon cancer s/p resection, colostomy May 2022, had 3 cycles of chemotherapy and 18 November 2020.  He also has type 2 diabetes mellitus, hypertension and dyslipidemia.  Prolonged hospitalization.  Initial presentation with abdominal pain and sepsis syndrome, complicated with septic shock, source mucositis combined with cepacitabine toxicity.  His hospital stay was complicated by atrial fibrillation with rapid ventricular response, Pseudomonas aspiration pneumonia with respiratory failure requiring mechanical ventilation.   Developed ventilator dependent respiratory failure, failed extubation 8/8, underwent tracheostomy 8/11.  Progressive renal failure to end-stage renal disease, now on renal replacement therapy  08/22 pulled trach and had to be replaced under bronch guidance. 08/25 trach removed and unable to reinsert, had to be re-intubated.  08/29 trach replaced in the OR be ENT.   Had pseudomonas tracheobronchitis treated with ceftazidome.    Has been experiencing abdominal pain, improved with analgesics and anxiolytics. Using [facility] valve with good toleration.  Continue to have NG tube for feedings.   Assessment & Plan:   Principal Problem:   Diabetes mellitus with hyperosmolarity without hyperglycemic hyperosmolar nonketotic coma (HCC) Active Problems:   Lactic acidosis   Acute renal failure superimposed on stage 2 chronic kidney disease (HCC)   Metabolic acidosis   Tachycardia   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 (HCC)   Adverse effect of chemotherapy   Pressure injury of skin   Atrial fibrillation with RVR (HCC)   Sepsis (HCC)    Malnutrition of moderate degree   Acute metabolic encephalopathy   Acute respiratory failure with hypoxemia (HCC)   Acute renal failure with acute cortical necrosis (HCC)   Shock ([place])   Status post tracheostomy ([place])   Endotracheal tube present   Hemodialysis status ([place])   Palliative care by specialist   SOB (shortness of breath)   Acute hypoxemic respiratory failure, aspiration pseudomonas pneumonia/ tracheobronchitis. Now sp tracheostomy (trach 08/08, 8/22, 08/29) due ventilator dependent respiratory failure.  Continue to use [facility] valve with good toleration, low oxygen requirement using 5 L per trach collar. ( #6 XLT)  Reported to continue to have thick trach secretions on 09.19.22.  Over last 48 hrs looks like his secretions have improved.  Follow up with pulmonary recommendations for further down size and capping trial.  Continue with glycopyrrolate and scopolamine.    2, Urinary tract infection due to yeast, urinary retention. Continue with IV fluconazole for a total of 5 days,  Blood cultures are no growth, no evidence of fungemia.    3.  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  Patient had a HD catheter placed on 08.15.22 on the left IJ. Renal replacement therapy per nephrology recommendations.    4. Acute blood loss anemia, anemia of chronic disease with iron deficiency.  Cell count has been stable.    5. Stage 3b colon cancer sp colectomy and colostomy on 05/22. Thrombocytopenia. Ileus has resolved.  Follow CT abdomen and pelvis on 09.23.22 with no acute changes Resumed tube feedings with good toleration.    6. Right lower extremity DVT On apixaban for anticoagulation.   7. Atrial fibrillation. HTN.  Anticoagulation with apixaban, heart rate remains well controlled.    8.  Metabolic encephalopathy with delirium. Moderate calorie protein malnutrition, swallow dysfunction. 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  Hopefully NG tube can be removed, if patient continue to have swallow dysfunction may have to consider PEG tube.  On  keppra.    8. T2DM, dyslipidemia  capillary glucose has remained stable, 184 and 178, Tube feedings have been resumed Continue insulin sliding scale for glucose cover and monitoring.  Basal insulin 10 units daily   Status is: Inpatient  Remains inpatient appropriate because:Inpatient level of care appropriate due to severity of illness  Dispo: The patient is from: Home              Anticipated d/c is to: SNF              Patient currently is medically stable to d/c.   Difficult to place patient No  DVT prophylaxis: Enoxaparin   Code Status:    full  Family Communication:  I spoke with patient's wife at the bedside, we talked in detail about patient's condition, plan of care and prognosis and all questions were addressed.      Nutrition Status: Nutrition Problem: Moderate Malnutrition Etiology: chronic illness, cancer and cancer related treatments Signs/Symptoms: mild fat depletion, mild muscle depletion, percent weight loss Percent weight loss: 11 % Interventions: Boost Breeze, Prostat, MVI      Subjective: Patient is feeling better today, no nausea or vomiting and no abdominal pain, no dyspnea and using [employer] valve with good toleration.   Objective: Vitals:   02/01/21 0423 02/01/21 0430 02/01/21 0801 02/01/21 0804  BP: 132/72  132/72   Pulse: (!) 106 (!) 109 (!) 106 (!) 105  Resp: _0 Temp: 98.1 F (36.7 C)  98.4 F (36.9 C)   TempSrc: Axillary  Axillary   SpO2: 98% 100% 97% 99%  Weight:      Height:        Intake/Output Summary (Last 24 hours) at 02/01/2021 1052 Last data filed at 02/01/2021 0500 Gross per 24 hour  Intake 0 ml  Output 50 ml  Net -50 ml   Filed Weights   01/30/21 0500 01/31/21 0806 01/31/21 1050  Weight: 103.1 kg 102.3 kg 100.8 kg    Examination:   General: Not in pain or dyspnea, deconditioned   Neurology: Awake and alert, non focal  E ENT: mild pallor, no icterus, oral mucosa moist. NG tube in place. Cardiovascular: No JVD. S1-S2 present, rhythmic, no gallops, rubs, or murmurs. No lower extremity edema. Pulmonary: positive breath sounds bilaterally, adequate air movement, no wheezing, rhonchi or rales. Gastrointestinal. Abdomen soft and non tender, ostomy in place Skin. No rashes Musculoskeletal: no joint deformities     Data Reviewed: I have personally reviewed following labs and imaging studies  CBC: Recent Labs  Lab 01/26/21 1222 01/27/21 0734 01/28/21 0247 01/29/21 0751 01/29/21 1233 01/30/21 1937 01/31/21 0814  WBC 18.5*   < > 14.7* 14.1* 18.0* 23.9* 19.1*  NEUTROABS 14.9*  --   --   --  14.4* 16.9*  --   HGB 9.1*   < > 9.3* 8.3* 9.2* 9.2* 8.5*  HCT 26.8*   < > 27.3* 24.4* 27.3* 26.7* 25.5*  MCV 84.8   < > 84.8 85.6 86.4 84.5 86.1  PLT 319   < > 285 309 344 381 324   < > = values in this interval not displayed.   Basic Metabolic Panel: Recent Labs  Lab 01/28/21 0247 01/29/21 0211 01/29/21 0751 01/29/21 1233 01/30/21 1959 01/31/21 0421 02/01/21 0410  NA 133* 131* 133* 134* 134* 134* 134*  K 4.1 3.8 3.8 3.7 4.2 4.0 3.7  CL 94* 92* 92* 95* 98 98 96*  CO2 _0 19* 23 23  GLUCOSE 166* 171* 161* 210* 127* 144* 195*  BUN 49* 72* 77* 31* 54* 58* 39*  CREATININE 3.19* 3.90* 4.01* 2.16* 4.44* 4.63* 4.05*  CALCIUM 8.8* 9.2 9.2 8.6* 9.4 9.2 9.3  MG 2.2  --   --   --   --   --   --   PHOS 5.2* 6.2* 6.1*  --  3.0 5.8* 4.3   GFR: Estimated Creatinine Clearance: 21.2 mL/min (A) (by C-G formula based on SCr of 4.05 mg/dL (H)). Liver Function Tests: Recent Labs  Lab 01/29/21 0751 01/29/21 1233 01/30/21 1959 01/31/21 0421 02/01/21 0410  AST  --  25  --   --   --   ALT  --  31  --   --   --   ALKPHOS  --  116  --   --   --   BILITOT  --  0.6  --   --   --   PROT  --  7.9  --   --   --   ALBUMIN 2.5* 2.7* 2.8* 2.6* 2.7*   No results for  input(s): LIPASE, AMYLASE in the last 168 hours. No results for input(s): AMMONIA in the last 168 hours. Coagulation Profile: No results for input(s): INR, PROTIME in the last 168 hours. Cardiac Enzymes: No results for input(s): CKTOTAL, CKMB, CKMBINDEX, TROPONINI in the last 168 hours. BNP (last 3 results) No results for input(s): PROBNP in the last 8760 hours. HbA1C: No results for input(s): HGBA1C in the last 72 hours. CBG: Recent Labs  Lab 01/31/21 1606 01/31/21 2011 02/01/21 0001 02/01/21 0408 02/01/21 0759  GLUCAP 184* 211* 92 185* 184*   Lipid Profile: No results for input(s): CHOL, HDL, LDLCALC, TRIG, CHOLHDL, LDLDIRECT in the last 72 hours. Thyroid Function Tests: No results for input(s): TSH, T4TOTAL, FREET4, T3FREE, THYROIDAB in the last 72 hours. Anemia Panel: No results for input(s): VITAMINB12, FOLATE, FERRITIN, TIBC, IRON, RETICCTPCT in the last 72 hours.    Radiology Studies: I have reviewed all of the imaging during this hospital visit personally     Scheduled Meds:  apixaban  5 mg Per Tube BID   chlorhexidine gluconate (MEDLINE KIT)  15 mL Mouth Rinse BID   Chlorhexidine Gluconate Cloth  6 each Topical Q0600   feeding supplement (NEPRO CARB STEADY)  1,000 mL Per Tube Q24H   feeding supplement (PROSource TF)  45 mL Per Tube BID   fiber  1 packet Per Tube BID   hydrocerin   Topical BID   insulin aspart  0-15 Units Subcutaneous Q4H   insulin detemir  10 Units Subcutaneous Daily   levETIRAcetam  500 mg Per Tube BID   LORazepam  0.5 mg Per Tube BID   mouth rinse  15 mL Mouth Rinse 10 times per day   multivitamin  1 tablet Per Tube QHS   scopolamine  1 patch Transdermal Q72H   Continuous Infusions:  sodium chloride Stopped (12/20/20 0955)   anticoagulant sodium citrate     fluconazole (DIFLUCAN) IV Stopped (01/31/21 1844)     LOS: 62 days        Josiah Nieto Gerome Apley, MD

## 2021-02-01 NOTE — Plan of Care (Signed)
  Problem: Education: Goal: Knowledge of General Education information will improve Description Including pain rating scale, medication(s)/side effects and non-pharmacologic comfort measures Outcome: Progressing   

## 2021-02-01 NOTE — Plan of Care (Signed)

## 2021-02-01 NOTE — Progress Notes (Signed)
[facility] ROUNDING NOTE   Subjective:   Interval History: 65 year old [name] with a history of colon cancer status postresection and colostomy May 2022.  Status post 3 cycles of chemotherapy last was in July 2022.  Type 2 diabetes with neuropathy hypertension hyperlipidemia presented to the emergency room 11/30/2020 with abdominal pain.  Was found to be secondary to sepsis.  Patient also was found to have acute kidney injury.  Developed atrial fibrillation with rapid ventricular rate and was admitted to intensive care unit for septic shock.  On 12/03/2020 he developed metabolic encephalopathy with hypoxia and aspiration pneumonia and required intubation 12/06/2020.  He also required pressor support.  He was reintubated since 12/15/2020 tracheostomy placed.  His hospital course was complicated by DVT.  It appears that he has recurrent Pseudomonas pneumonia.  He also has recurrent anemia requiring transfusion of 4 units of packed red blood cells.  Blood pressure 132/72 pulse 109 temperature 98.4 O2 sats under percent trach  Sodium 134 potassium 3.7 chloride 96 CO2 23 BUN 39 creatinine 4 glucose 195 calcium 9.3  Minimal urine output.  Dialysis 01/31/2021 with 1.5 L removed next dialysis to be 02/03/2021  Objective:  Vital signs in last 24 hours:  Temp:  [97.7 F (36.5 C)-99.8 F (37.7 C)] 98.4 F (36.9 C) (09/25 0801) Pulse Rate:  [94-117] 105 (09/25 0804) Resp:  [13-31] 13 (09/25 0804) BP: (99-138)/(69-97) 132/72 (09/25 0801) SpO2:  [95 %-100 %] 99 % (09/25 0804) FiO2 (%):  [28 %] 28 % (09/25 0804) Weight:  [100.8 kg] 100.8 kg (09/24 1050)  Weight change:  Filed Weights   01/30/21 0500 01/31/21 0806 01/31/21 1050  Weight: 103.1 kg 102.3 kg 100.8 kg    Intake/Output: I/O last 3 completed shifts: In: 1211.4 [I.V.:661.4; IV Piggyback:550] Out: 3536 [Urine:200; Other:1500; Stool:150]   Intake/Output this shift:  No intake/output data recorded.  General: Ill-looking  male, with tracheostomy on trach collar, not in distress Heart:RRR, s1s2 nl Lungs: normal wob Abdomen:soft, Non-tender, non-distended Extremities: No edema. Neurology: Alert awake and following commands. Dialysis Access: [facility] in place.    Basic Metabolic Panel: Recent Labs  Lab 01/28/21 0247 01/29/21 0211 01/29/21 0751 01/29/21 1233 01/30/21 1959 01/31/21 0421 02/01/21 0410  NA 133* 131* 133* 134* 134* 134* 134*  K 4.1 3.8 3.8 3.7 4.2 4.0 3.7  CL 94* 92* 92* 95* 98 98 96*  CO2 _0 19* 23 23  GLUCOSE 166* 171* 161* 210* 127* 144* 195*  BUN 49* 72* 77* 31* 54* 58* 39*  CREATININE 3.19* 3.90* 4.01* 2.16* 4.44* 4.63* 4.05*  CALCIUM 8.8* 9.2 9.2 8.6* 9.4 9.2 9.3  MG 2.2  --   --   --   --   --   --   PHOS 5.2* 6.2* 6.1*  --  3.0 5.8* 4.3     Liver Function Tests: Recent Labs  Lab 01/29/21 0751 01/29/21 1233 01/30/21 1959 01/31/21 0421 02/01/21 0410  AST  --  25  --   --   --   ALT  --  31  --   --   --   ALKPHOS  --  116  --   --   --   BILITOT  --  0.6  --   --   --   PROT  --  7.9  --   --   --   ALBUMIN 2.5* 2.7* 2.8* 2.6* 2.7*    No results for input(s): LIPASE, AMYLASE in the last  168 hours. No results for input(s): AMMONIA in the last 168 hours.  CBC: Recent Labs  Lab 01/26/21 1222 01/27/21 0734 01/28/21 0247 01/29/21 0751 01/29/21 1233 01/30/21 1937 01/31/21 0814  WBC 18.5*   < > 14.7* 14.1* 18.0* 23.9* 19.1*  NEUTROABS 14.9*  --   --   --  14.4* 16.9*  --   HGB 9.1*   < > 9.3* 8.3* 9.2* 9.2* 8.5*  HCT 26.8*   < > 27.3* 24.4* 27.3* 26.7* 25.5*  MCV 84.8   < > 84.8 85.6 86.4 84.5 86.1  PLT 319   < > 285 309 344 381 324   < > = values in this interval not displayed.     Cardiac Enzymes: No results for input(s): CKTOTAL, CKMB, CKMBINDEX, TROPONINI in the last 168 hours.  BNP: Invalid input(s): POCBNP  CBG: Recent Labs  Lab 01/31/21 1606 01/31/21 2011 02/01/21 0001 02/01/21 0408 02/01/21 0759  GLUCAP 184* 211* 92 185* 184*      Microbiology: Results for orders placed or performed during the hospital encounter of 11/30/20  Resp Panel by RT-PCR (Flu A&B, Covid) Nasopharyngeal Swab     Status: None   Collection Time: 11/30/20 10:05 PM   Specimen: Nasopharyngeal Swab; Nasopharyngeal(NP) swabs in vial transport medium  Result Value Ref Range Status   SARS Coronavirus 2 by RT PCR NEGATIVE NEGATIVE Final    Comment: (NOTE) SARS-CoV-2 target nucleic acids are NOT DETECTED.  The SARS-CoV-2 RNA is generally detectable in upper respiratory specimens during the acute phase of infection. The lowest concentration of SARS-CoV-2 viral copies this assay can detect is 138 copies/mL. A negative result does not preclude SARS-Cov-2 infection and should not be used as the sole basis for treatment or other patient management decisions. A negative result may occur with  improper specimen collection/handling, submission of specimen other than nasopharyngeal swab, presence of viral mutation(s) within the areas targeted by this assay, and inadequate number of viral copies(<138 copies/mL). A negative result must be combined with clinical observations, patient history, and epidemiological information. The expected result is Negative.  Fact Sheet for Patients:  EntrepreneurPulse.com.au  Fact Sheet for Healthcare Providers:  IncredibleEmployment.be  This test is no t yet approved or cleared by the [place] FDA and  has been authorized for detection and/or diagnosis of SARS-CoV-2 by FDA under an Emergency Use Authorization (EUA). This EUA will remain  in effect (meaning this test can be used) for the duration of the COVID-19 declaration under Section 564(b)(1) of the Act, 21 U.S.C.section 360bbb-3(b)(1), unless the authorization is terminated  or revoked sooner.       Influenza A by PCR NEGATIVE NEGATIVE Final   Influenza B by PCR NEGATIVE NEGATIVE Final    Comment: (NOTE) The Xpert  Xpress SARS-CoV-2/FLU/RSV plus assay is intended as an aid in the diagnosis of influenza from Nasopharyngeal swab specimens and should not be used as a sole basis for treatment. Nasal washings and aspirates are unacceptable for Xpert Xpress SARS-CoV-2/FLU/RSV testing.  Fact Sheet for Patients: EntrepreneurPulse.com.au  Fact Sheet for Healthcare Providers: IncredibleEmployment.be  This test is not yet approved or cleared by the [place] FDA and has been authorized for detection and/or diagnosis of SARS-CoV-2 by FDA under an Emergency Use Authorization (EUA). This EUA will remain in effect (meaning this test can be used) for the duration of the COVID-19 declaration under Section 564(b)(1) of the Act, 21 U.S.C. section 360bbb-3(b)(1), unless the authorization is terminated or revoked.  Performed at [facility], [place] [address] [place], [place] [address]   Blood Culture (routine x 2)     Status: None   Collection Time: 11/30/20 10:05 PM   Specimen: BLOOD  Result Value Ref Range Status   Specimen Description   Final    BLOOD LEFT ANTECUBITAL Performed at [facility] [address], [place], [place] [address]    Special Requests   Final    BOTTLES DRAWN AEROBIC AND ANAEROBIC Blood Culture results may not be optimal due to an inadequate volume of blood received in culture bottles Performed at [facility] [address], [place], [place] [address]    Culture   Final    NO GROWTH 5 DAYS Performed at [facility], [place] [address] [place], [place] [address]    Report Status 12/06/2020 FINAL  Final  Blood Culture (routine x 2)     Status: None   Collection Time: 11/30/20 10:33 PM   Specimen: BLOOD  Result Value Ref Range Status   Specimen Description   Final    BLOOD RIGHT ANTECUBITAL Performed at [facility] [address] [place], [place] [address]     Special Requests   Final    BOTTLES DRAWN AEROBIC AND ANAEROBIC Blood Culture adequate volume Performed at [facility] [address] [place], [place] [address]    Culture   Final    NO GROWTH 5 DAYS Performed at [facility], [place] [address] [place], [place] [address]    Report Status 12/06/2020 FINAL  Final  Urine Culture     Status: None   Collection Time: 12/01/20  2:00 AM   Specimen: In/Out Cath Urine  Result Value Ref Range Status   Specimen Description   Final    IN/OUT CATH URINE Performed at [facility] [address] [place], [place] [address]    Special Requests   Final    NONE Performed at [facility], [place] [address], [place], [place] [address]    Culture   Final    NO GROWTH Performed at [facility], [place] [address] [place], [place] [address]    Report Status 12/02/2020 FINAL  Final  MRSA Next Gen by PCR, Nasal     Status: None   Collection Time: 12/01/20  8:28 AM   Specimen: Nasal Mucosa; Nasal Swab  Result Value Ref Range Status   MRSA by PCR Next Gen NOT DETECTED NOT DETECTED Final    Comment: (NOTE) The GeneXpert MRSA Assay (FDA approved for NASAL specimens only), is one component of a comprehensive MRSA colonization surveillance program. It is not intended to diagnose MRSA infection nor to guide or monitor treatment for MRSA infections. Test performance is not FDA approved in patients less than 3 years old. Performed at [facility], [place] [address] [place], [place] [address]   C Difficile Quick Screen w PCR reflex     Status: None   Collection Time: 12/02/20  4:33 PM   Specimen: STOOL  Result Value Ref Range Status   C Diff antigen NEGATIVE NEGATIVE Final   C Diff toxin NEGATIVE NEGATIVE Final   C Diff interpretation No C. difficile detected.  Final    Comment: Performed at [facility], [place] [address] [place], [place] [address]   Culture, Respiratory w Gram Stain     Status: None   Collection Time: 12/07/20  8:14 AM  Specimen: Tracheal Aspirate; Respiratory  Result Value Ref Range Status   Specimen Description   Final    TRACHEAL ASPIRATE Performed at [facility] [address] [place], [place] [address]    Special Requests   Final    NONE Performed at [facility], [place] [address] [place], [place] [address]    Gram Stain   Final    FEW WBC PRESENT,BOTH PMN AND MONONUCLEAR ABUNDANT YEAST FEW GRAM POSITIVE RODS Performed at [facility], [place] [address], [place], [place] [address]    Culture MODERATE CANDIDA TROPICALIS  Final   Report Status 12/09/2020 FINAL  Final  Gastrointestinal Panel by PCR , Stool     Status: None   Collection Time: 12/08/20  2:05 PM   Specimen: Stool  Result Value Ref Range Status   Campylobacter species NOT DETECTED NOT DETECTED Final   Plesimonas shigelloides NOT DETECTED NOT DETECTED Final   Salmonella species NOT DETECTED NOT DETECTED Final   Yersinia enterocolitica NOT DETECTED NOT DETECTED Final   Vibrio species NOT DETECTED NOT DETECTED Final   Vibrio cholerae NOT DETECTED NOT DETECTED Final   Enteroaggregative E coli (EAEC) NOT DETECTED NOT DETECTED Final   Enteropathogenic E coli (EPEC) NOT DETECTED NOT DETECTED Final   Enterotoxigenic E coli (ETEC) NOT DETECTED NOT DETECTED Final   Shiga like toxin producing E coli (STEC) NOT DETECTED NOT DETECTED Final   Shigella/Enteroinvasive E coli (EIEC) NOT DETECTED NOT DETECTED Final   Cryptosporidium NOT DETECTED NOT DETECTED Final   Cyclospora cayetanensis NOT DETECTED NOT DETECTED Final   Entamoeba histolytica NOT DETECTED NOT DETECTED Final   Giardia lamblia NOT DETECTED NOT DETECTED Final   Adenovirus F40/41 NOT DETECTED NOT DETECTED Final   Astrovirus NOT DETECTED NOT DETECTED Final   Norovirus GI/GII NOT DETECTED NOT DETECTED Final   Rotavirus A NOT DETECTED NOT DETECTED  Final   Sapovirus (I, II, IV, and V) NOT DETECTED NOT DETECTED Final    Comment: Performed at [facility], [place] [place], [place] [address]  Culture, Respiratory w Gram Stain     Status: None   Collection Time: 12/22/20 10:13 AM   Specimen: Tracheal Aspirate; Respiratory  Result Value Ref Range Status   Specimen Description TRACHEAL ASPIRATE  Final   Special Requests NONE  Final   Gram Stain   Final    FEW SQUAMOUS EPITHELIAL CELLS PRESENT FEW WBC SEEN ABUNDANT GRAM POSITIVE COCCI FEW GRAM VARIABLE ROD Performed at [facility], [place] [address], [place], [place] [address]    Culture RARE PSEUDOMONAS AERUGINOSA  Final   Report Status 12/26/2020 FINAL  Final   Organism ID, Bacteria PSEUDOMONAS AERUGINOSA  Final      Susceptibility   Pseudomonas aeruginosa - MIC*    CEFTAZIDIME <=1 SENSITIVE Sensitive     GENTAMICIN <=1 SENSITIVE Sensitive     PIP/TAZO 8 SENSITIVE Sensitive     * RARE PSEUDOMONAS AERUGINOSA  MRSA Next Gen by PCR, Nasal     Status: Abnormal   Collection Time: 01/02/21 11:03 AM   Specimen: Nasal Mucosa; Nasal Swab  Result Value Ref Range Status   MRSA by PCR Next Gen (A) NOT DETECTED Final    INVALID, UNABLE TO DETERMINE THE PRESENCE OF TARGET DUE TO SPECIMEN INTEGRITY. RECOLLECTION REQUESTED.    Comment:        The GeneXpert MRSA Assay (FDA approved for NASAL specimens only), is one component of a comprehensive MRSA colonization  surveillance program. It is not intended to diagnose MRSA infection nor to guide or monitor treatment for MRSA infections. Performed at [facility], [place] [address], [place], [place] [address]   MRSA Next Gen by PCR, Nasal     Status: None   Collection Time: 01/03/21  3:08 AM   Specimen: Nasal Mucosa; Nasal Swab  Result Value Ref Range Status   MRSA by PCR Next Gen NOT DETECTED NOT DETECTED Final    Comment: (NOTE) The GeneXpert MRSA Assay (FDA approved for NASAL specimens only), is one component  of a comprehensive MRSA colonization surveillance program. It is not intended to diagnose MRSA infection nor to guide or monitor treatment for MRSA infections. Test performance is not FDA approved in patients less than 2 years old. Performed at [facility], [place] [address], [place], [place] [address]   Urine Culture     Status: Abnormal   Collection Time: 01/14/21  8:46 AM   Specimen: In/Out Cath Urine  Result Value Ref Range Status   Specimen Description IN/OUT CATH URINE  Final   Special Requests   Final    NONE Performed at [facility], [place] [address], [place], [place] [address]    Culture MULTIPLE SPECIES PRESENT, SUGGEST RECOLLECTION (A)  Final   Report Status 01/15/2021 FINAL  Final  Culture, blood (Routine X 2) w Reflex to ID Panel     Status: None   Collection Time: 01/14/21 11:21 AM   Specimen: BLOOD  Result Value Ref Range Status   Specimen Description BLOOD RIGHT ANTECUBITAL  Final   Special Requests   Final    BOTTLES DRAWN AEROBIC ONLY Blood Culture adequate volume   Culture   Final    NO GROWTH 5 DAYS Performed at [facility], [place] [address], [place], [place] [address]    Report Status 01/19/2021 FINAL  Final  Culture, blood (Routine X 2) w Reflex to ID Panel     Status: None   Collection Time: 01/14/21 11:22 AM   Specimen: BLOOD  Result Value Ref Range Status   Specimen Description BLOOD LEFT ANTECUBITAL  Final   Special Requests   Final    BOTTLES DRAWN AEROBIC ONLY Blood Culture adequate volume   Culture   Final    NO GROWTH 5 DAYS Performed at [facility], [place] [address], [place], [place] [address]    Report Status 01/19/2021 FINAL  Final  Culture, Respiratory w Gram Stain     Status: None   Collection Time: 01/14/21 11:40 AM   Specimen: Tracheal Aspirate; Respiratory  Result Value Ref Range Status   Specimen Description TRACHEAL ASPIRATE  Final   Special Requests NONE  Final   Gram Stain   Final    ABUNDANT WBC  PRESENT,BOTH PMN AND MONONUCLEAR FEW SQUAMOUS EPITHELIAL CELLS PRESENT MODERATE GRAM NEGATIVE RODS FEW GRAM VARIABLE ROD Performed at [facility], [place] [address] [place], [place] [address]    Culture MODERATE PSEUDOMONAS AERUGINOSA  Final   Report Status 01/18/2021 FINAL  Final   Organism ID, Bacteria PSEUDOMONAS AERUGINOSA  Final      Susceptibility   Pseudomonas aeruginosa - MIC*    CEFTAZIDIME <=1 SENSITIVE Sensitive     CIPROFLOXACIN <=0.25 SENSITIVE Sensitive     GENTAMICIN <=1 SENSITIVE Sensitive     PIP/TAZO <=4 SENSITIVE Sensitive     * MODERATE PSEUDOMONAS AERUGINOSA  Urine Culture     Status: Abnormal  Collection Time: 01/26/21  9:05 AM   Specimen: Urine, Catheterized  Result Value Ref Range Status   Specimen Description URINE, CATHETERIZED  Final   Special Requests   Final    NONE Performed at [facility], [address], [place], [place] [address]    Culture >=100,000 COLONIES/mL YEAST (A)  Final   Report Status 01/29/2021 FINAL  Final    Coagulation Studies: No results for input(s): LABPROT, INR in the last 72 hours.  Urinalysis: No results for input(s): COLORURINE, LABSPEC, PHURINE, GLUCOSEU, HGBUR, BILIRUBINUR, KETONESUR, PROTEINUR, UROBILINOGEN, NITRITE, LEUKOCYTESUR in the last 72 hours.  Invalid input(s): APPERANCEUR     Imaging: CT ABDOMEN PELVIS WO CONTRAST  Result Date: 01/30/2021 CLINICAL DATA:  Abdominal pain.  Bowel obstruction suspected. EXAM: CT ABDOMEN AND PELVIS WITHOUT CONTRAST TECHNIQUE: Multidetector CT imaging of the abdomen and pelvis was performed following the standard protocol without IV contrast. COMPARISON:  Prior radiographs.  Most recent CT 01/26/2021 FINDINGS: Lower chest: Subsegmental atelectasis in both lower lobes. No pleural fluid. Hepatobiliary: Focal liver abnormality on this unenhanced exam. Gallbladder physiologically distended, no calcified stone. No biliary dilatation. Pancreas: No ductal dilatation or  inflammation. Spleen: Normal in size without focal abnormality. Splenules inferiorly. Adrenals/Urinary Tract: Normal adrenal glands. No hydronephrosis or renal calculi. Mild bilateral perinephric edema. Again seen cyst in the mid left kidney. Decompressed urinary bladder by Foley catheter. Stomach/Bowel: Transverse colectomy with right upper quadrant ostomy. The descending and sigmoid colon remain in on-site 2 and are unchanged in appearance, with intraluminal fluid. No associated inflammation. This is similar in appearance to prior exam. The stomach is decompressed. Feeding tube tip in the proximal duodenum. No obstruction or small bowel inflammation. Vascular/Lymphatic: Aorto bi-iliac atherosclerosis. No aortic aneurysm. Retroaortic left renal vein. Portal venous or mesenteric gas. No bulky abdominopelvic adenopathy. Reproductive: Prostate is unremarkable. Other: No ascites. No free air. No evidence of focal fluid collection or abscess. Fat containing inguinal hernias. Postsurgical change of the anterior abdominal wall without abdominal wall collection. Musculoskeletal: Stable osseous structures. IMPRESSION: 1. No bowel obstruction or acute abnormality in the abdomen/pelvis. No abscess. 2. Stable postsurgical change of the transverse colon. 3. Improving basilar opacities from prior abdominal CT, favoring atelectasis. Aortic Atherosclerosis (ICD10-I70.0). Electronically Signed   By: Keith Rake M.D.   On: 01/30/2021 22:33     Medications:    sodium chloride Stopped (12/20/20 0955)   anticoagulant sodium citrate     fluconazole (DIFLUCAN) IV Stopped (01/31/21 1844)    apixaban  5 mg Per Tube BID   chlorhexidine gluconate (MEDLINE KIT)  15 mL Mouth Rinse BID   Chlorhexidine Gluconate Cloth  6 each Topical Q0600   feeding supplement (NEPRO CARB STEADY)  1,000 mL Per Tube Q24H   feeding supplement (PROSource TF)  45 mL Per Tube BID   fiber  1 packet Per Tube BID   hydrocerin   Topical BID   insulin  aspart  0-15 Units Subcutaneous Q4H   insulin detemir  10 Units Subcutaneous Daily   levETIRAcetam  500 mg Per Tube BID   LORazepam  0.5 mg Per Tube BID   mouth rinse  15 mL Mouth Rinse 10 times per day   multivitamin  1 tablet Per Tube QHS   scopolamine  1 patch Transdermal Q72H   sodium chloride, acetaminophen (TYLENOL) oral liquid 160 mg/5 mL, albuterol, dextrose, glycopyrrolate, lip balm, metoprolol tartrate, ondansetron (ZOFRAN) IV, sodium chloride flush  Assessment/ Plan:  1.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 CRRT initiated 7/31 and transitioned to IHD on 12/19/20.  S/p [facility] placement 12/22/20.  No signs of meaningful recovery last dialysis was 01/27/2021 Next dialysis will be 01/29/2021.  We will closely monitor for signs of recovery.  There does seem to be some improvement in urine output.  Dialysis with 1.5 L removed 01/31/2021 Next dialysis will be 02/03/2021    2.Urinary retention   Foley catheter placed   3.Volume overload/anasarca-improved with dialysis.  4.Sepsis/aspiration pneumonia/VDRF:aspiration pneumonia.  Tracheostomy placed  4.Stage IIIB colon cancer - sp partial colectomy, colostomy. On chemoRx sp 3 cycles.   5.Right leg DVT: On Eliquis.  6.Atrial fibrillation - on amiodarone and anticoagulation.    7.Anemia of critical illness: DC'ed  IV iron as concern for infection and on abx, transfuse as needed, holding erythropoietin with active malignancy.  Monitor hemoglobin and transfuse as needed.  8. CKD-MBD; started sevelamer for hyperphosphatemia on 9/8, monitor phosphorus level.--improved   9. Disposition - poor overall prognosis.  Seen by palliative care team.  Discussion ongoing for possible transfer to [facility].  The tracheostomy should be capped before he can be accepted to [facility]    LOS: [place] _0 _1 :51 AM

## 2021-02-01 NOTE — Progress Notes (Signed)
Pt again complaining of severe abdominal pain in LLQ. TF restarted approx 0200. NGT placement verified again. Bladderscan = 0. Pt requesting tylenol for pain. Dr. Cyd Silence paged.

## 2021-02-02 DIAGNOSIS — E1100 Type 2 diabetes mellitus with hyperosmolarity without nonketotic hyperglycemic-hyperosmolar coma (NKHHC): Secondary | ICD-10-CM | POA: Diagnosis not present

## 2021-02-02 LAB — RENAL FUNCTION PANEL
Albumin: 2.6 g/dL — ABNORMAL LOW (ref 3.5–5.0)
Anion gap: 16 — ABNORMAL HIGH (ref 5–15)
BUN: 59 mg/dL — ABNORMAL HIGH (ref 8–23)
CO2: 23 mmol/L (ref 22–32)
Calcium: 9.6 mg/dL (ref 8.9–10.3)
Chloride: 97 mmol/L — ABNORMAL LOW (ref 98–111)
Creatinine, Ser: 5.24 mg/dL — ABNORMAL HIGH (ref 0.61–1.24)
GFR, Estimated: 12 mL/min — ABNORMAL LOW (ref >60)
Glucose, Bld: 124 mg/dL — ABNORMAL HIGH (ref 70–99)
Phosphorus: 6.3 mg/dL — ABNORMAL HIGH (ref 2.5–4.6)
Potassium: 3.7 mmol/L (ref 3.5–5.1)
Sodium: 136 mmol/L (ref 135–145)

## 2021-02-02 LAB — GLUCOSE, CAPILLARY
Glucose-Capillary: 110 mg/dL — ABNORMAL HIGH (ref 70–99)
Glucose-Capillary: 126 mg/dL — ABNORMAL HIGH (ref 70–99)
Glucose-Capillary: 127 mg/dL — ABNORMAL HIGH (ref 70–99)
Glucose-Capillary: 132 mg/dL — ABNORMAL HIGH (ref 70–99)
Glucose-Capillary: 144 mg/dL — ABNORMAL HIGH (ref 70–99)
Glucose-Capillary: 148 mg/dL — ABNORMAL HIGH (ref 70–99)
Glucose-Capillary: 186 mg/dL — ABNORMAL HIGH (ref 70–99)

## 2021-02-02 MED ORDER — APIXABAN 5 MG PO TABS
5.0000 mg | ORAL_TABLET | Freq: Two times a day (BID) | ORAL | Status: DC
  Administered 2021-02-02 – 2021-02-04 (×5): 5 mg
  Filled 2021-02-02: qty 1
  Filled 2021-02-02: qty 2
  Filled 2021-02-02 (×2): qty 1

## 2021-02-02 MED ORDER — APIXABAN 5 MG PO TABS: 5.0000 mg | ORAL_TABLET | Freq: Two times a day (BID) | ORAL | Status: DC

## 2021-02-02 MED ORDER — NYSTATIN 100000 UNIT/ML MT SUSP: 5.0000 mL | Freq: Four times a day (QID) | OROMUCOSAL | Status: DC

## 2021-02-02 MED ORDER — ENOXAPARIN SODIUM 100 MG/ML IJ SOSY: 100.0000 mg | PREFILLED_SYRINGE | Freq: Once | INTRAMUSCULAR | Status: DC

## 2021-02-02 MED ORDER — CLONAZEPAM 0.5 MG PO TBDP
0.5000 mg | ORAL_TABLET | Freq: Three times a day (TID) | ORAL | Status: DC | PRN
  Administered 2021-02-03 – 2021-02-04 (×3): 0.5 mg via ORAL
  Filled 2021-02-02 (×3): qty 1

## 2021-02-02 MED ORDER — TRAMADOL HCL 50 MG PO TABS
25.0000 mg | ORAL_TABLET | Freq: Four times a day (QID) | ORAL | Status: DC | PRN
  Administered 2021-02-03: 25 mg
  Filled 2021-02-02: qty 1

## 2021-02-02 MED ORDER — NYSTATIN 100000 UNIT/ML MT SUSP
5.0000 mL | Freq: Four times a day (QID) | OROMUCOSAL | Status: DC
  Administered 2021-02-02 – 2021-02-16 (×46): 500000 [IU] via OROMUCOSAL
  Filled 2021-02-02 (×43): qty 5

## 2021-02-02 NOTE — Plan of Care (Signed)
  Problem: Education: Goal: Knowledge of General Education information will improve Description: Including pain rating scale, medication(s)/side effects and non-pharmacologic comfort measures Outcome: Progressing   Problem: Nutrition: Goal: Adequate nutrition will be maintained Outcome: Progressing   Problem: Coping: Goal: Level of anxiety will decrease Outcome: Progressing   

## 2021-02-02 NOTE — Progress Notes (Signed)
Patient with no further abdominal pain, managing trach secretions well.  Speech evaluation, found decrease tongue mobility that is affecting his swallow.   Added nystatin swish and spit, continue with aggressive oral care.  Follow with pulmonary for possible trach capping trials. Continue to encourage mobility and out of bed.   I spoke with patient's wife at the bedside, we talked in detail about patient's condition, plan of care and prognosis and all questions were addressed.

## 2021-02-02 NOTE — Progress Notes (Signed)
Physical Therapy Treatment Patient Details Name: Thomas [name] MRN: [id number] DOB: 29-Mar-1956 Today's Date: 02/02/2021   History of Present Illness 65 y.o. male admitted 7/24 with high ostomy output. Pt with severe sepsis and multisystem organ failure. Hospital admission complicated by volume overload, aspiration pneumonia, acute respiratory failure, AKI and ischemic colitis. Intubated 7/30. S/p trach placement 8/11. CorTrak 8/12. HD initiated 8/12. To IR 8/15 for tunneled HD cath. Pulled out trach x2 8/22 replaced by PCCM. Pulled out trach again 8/25 but unable to be reinserted with pt intubated. 8/29 to OR for stoma clean up and trach replacement.Trach downsized 9/10.  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    PT Comments    Pt pleasant and reports enjoying fishing and wants to see his grandkids (7yo, 56mo, 3mo). Pt reports motivation but is also emotional and tearful with lack of ability to mobilize. Pt benefits from encouragement and reassurance with all mobility. Pt able to stand x 3 trials and perform seated HEp but standing trials limited to 10 sec max. Encouraged continued mobility and sitting EOb with nursing as well as HEP throughout the day.  Pt maintained 100% SpO2 on RA and 28% trach collar during session.    Recommendations for follow up therapy are one component of a multi-disciplinary discharge planning process, led by the attending physician.  Recommendations may be updated based on patient status, additional functional criteria and insurance authorization.  Follow Up Recommendations  SNF     Equipment Recommendations  Wheelchair (measurements PT);Wheelchair cushion (measurements PT);3in1 (PT);Rolling walker with 5" wheels    Recommendations for Other Services        Precautions / Restrictions Precautions Precaution Comments: cortrak, R subclavian tunneled HD cath, ostomy, O2 via trach collar     Mobility  Bed Mobility   Bed Mobility: Rolling;Sidelying to Sit Rolling: Min guard Sidelying to sit: Min assist;HOB elevated       General bed mobility comments: HOB 35 degrees with cues for sequence, reassurance and assist to lift trunk from surface    Transfers Overall transfer level: Needs assistance   Transfers: Sit to/from Stand Sit to Stand: Min assist;From elevated surface;Mod assist         General transfer comment: min assist to stand from elevated bed and stedy with max cues for hand placement and sequence. Pt would only stand grossly 5 sec before stating fatigue and needing to sit. From chair to stedy stood 10 sec with mod assist to rise, increased time and 2 attempts to complete transfers. Pivot bed to chair with stedy  Ambulation/Gait             General Gait Details: not able yet   Stairs             Wheelchair Mobility    Modified Rankin (Stroke Patients Only)       Balance Overall balance assessment: Needs assistance Sitting-balance support: No upper extremity supported;Bilateral upper extremity supported;Feet supported Sitting balance-Leahy Scale: Good Sitting balance - Comments: EOb without physical assist   Standing balance support: Bilateral upper extremity supported;During functional activity Standing balance-Leahy Scale: Poor Standing balance comment: reliant on Stedy bar                            Cognition Arousal/Alertness: Awake/alert Behavior During  Therapy: Anxious Overall Cognitive Status: Impaired/Different from baseline                               Problem Solving: Slow processing General Comments: pt stating desire to get stronger, fish and see grandkids but also appears self-limiting when struggling with weakness      Exercises General Exercises -  Lower Extremity Long Arc Quad: AROM;Both;Seated;15 reps Heel Slides: AROM;Both;Supine;10 reps Straight Leg Raises: AROM;Supine;10 reps;Both Hip Flexion/Marching: AROM;Both;Seated;15 reps Toe Raises: AROM;Right;Left;AAROM;Seated;15 reps (AAROm on RLE) Heel Raises: AROM;Both;Seated;15 reps    General Comments        Pertinent Vitals/Pain Pain Assessment: Faces Pain Score: 6  Faces Pain Scale: No hurt Pain Location: abdomen Pain Descriptors / Indicators: Aching;Guarding Pain Intervention(s): Limited activity within patient's tolerance;Patient requesting pain meds-RN notified;Monitored during session;Repositioned    Home Living                      Prior Function            PT Goals (current goals can now be found in the care plan section) Acute Rehab PT Goals Time For Goal Achievement: 02/16/21 Potential to Achieve Goals: Fair Progress towards PT goals: Progressing toward goals    Frequency    Min 3X/week      PT Plan Current plan remains appropriate    Co-evaluation              AM-PAC PT "6 Clicks" Mobility   Outcome Measure  Help needed turning from your back to your side while in a flat bed without using bedrails?: A Little Help needed moving from lying on your back to sitting on the side of a flat bed without using bedrails?: A Little Help needed moving to and from a bed to a chair (including a wheelchair)?: Total Help needed standing up from a chair using your arms (e.g., wheelchair or bedside chair)?: A Lot Help needed to walk in hospital room?: Total Help needed climbing 3-5 steps with a railing? : Total 6 Click Score: 11    End of Session   Activity Tolerance: Patient tolerated treatment well Patient left: in chair;with call bell/phone within reach;with chair alarm set Nurse Communication: Mobility status;Need for lift equipment (stedy for transfers with +2 assist) PT Visit Diagnosis: Muscle weakness (generalized) (M62.81);Other  abnormalities of gait and mobility (R26.89)     Time: 5400-8676 PT Time Calculation (min) (ACUTE ONLY): 35 min  Charges:  $Therapeutic Exercise: 8-22 mins $Therapeutic Activity: 8-22 mins                     November Sypher P, PT Acute Rehabilitation Services Pager: [phone] Office: [phone]    Sandy Salaam Divon Krabill 02/02/2021, 12:35 PM

## 2021-02-02 NOTE — Progress Notes (Signed)
Cortrak Tube Team Note:  Consult received due to Cortrak being clogged. RN attempted unclogging protocol with no success. RD able to unclog Cortrak at bedside without need to adjust tube or replace tube. Tube is ready for use. Discussed with RN.    If the tube becomes dislodged please keep the tube and contact the Cortrak team at [email or web address] (password [email or web address]) for replacement.  If after hours and replacement cannot be delayed, place a NG tube and confirm placement with an abdominal x-ray.    Larkin Ina, MS, RD, LDN (she/her/hers) RD pager number and weekend/on-call pager number located in [place].

## 2021-02-02 NOTE — Progress Notes (Addendum)
[facility] PROGRESS NOTE  [facility] WLN:[id number] DOB: July 25, 1955 DOA: 11/30/2020 PCP: [name], NP  Status: Remains inpatient appropriate because:Persistent severe electrolyte disturbances, Unsafe d/c plan, and Inpatient level of care appropriate due to severity of illness  Dispo: The patient is from: Home              Anticipated d/c is to: SNF versus LTAC vs CIR              Patient currently is not medically stable to d/c.   Difficult to place patient Yes               Barriers to discharge: Currently has tracheostomy in place and is requiring dialysis but remains too weak to tolerate dialysis in chair which is a requirement for outpatient HD    Level of care: Progressive  Code Status: Full Family Communication: Wife at bedside 9/22 DVT prophylaxis: Eliquis COVID vaccination status: Moderna 07/08/2019 and 08/11/2019 with first booster dose given 08/05/2020   HPI:  65 y.o. male with a history of colon cancer s/p resection and colostomy May 2022 s/p 3 cycles chemotherapy (last was July 2022), T2DM with neuropathy, HTN, HLD who presented to the ED 7/24 with abdominal pain found to be febrile, tachycardic, tachypneic. Lactic acid was 6.6 and persisted at 6.8 after sepsis IV fluid bolus and cefepime. He had ARF, demand ischemia, bandemia, was hyperglycemic with negative ketones, with lactic acidosis. He developed AFib with RVR in the ED, was started on diltiazem, and was admitted to the ICU for septic shock with unclear source. Sepsis was subsequently felt to be ruled out at time of admission, presentation felt more likely to be related to cepacitabine toxicity with oral mucositis, hand/foot syndrome, and diarrhea/high output colostomy. He was transferred to hospitalist service 7/27 but developed metabolic encephalopathy with hyoxia due to aspiration pneumonia on 7/30 requiring intubation and pressor support. Failed extubation with inability to clear secretions, requiring  reintubation 8/8, and tracheostomy subsequently placed 8/11. This was pulled out 8/22, replaced under bronch guidance, again removed 8/25 requiring reintubation after failed reinsertion. ENT revised tracheostomy in the OR 8/29. He has required IHD for ARF, and also had complications of RLE DVT now on eliquis, colitis thought to be ischemic 8/2 not requiring specific intervention, Pseudomonas pneumonia having completed treatment, agitated encephalopathy which has improved, anemia requiring 4u PRBCs total, and thrombocytopenia which has resolved. On 9/4 he was transferred to hospitalist service having tolerated trach collar.  Subjective: States abdominal pain has been gone for at least 2 days.  Has questions regarding when trach can be discontinued.  He will let me know that if trach is out he may be able to swallow better.  Objective: Vitals:   02/02/21 0433 02/02/21 0721  BP: 127/72 121/77  Pulse: 88 89  Resp: 18 15  Temp: 98.6 F (37 C) 98.4 F (36.9 C)  SpO2: 99% 100%    Intake/Output Summary (Last 24 hours) at 02/02/2021 0736 Last data filed at 02/01/2021 1323 Gross per 24 hour  Intake 25 ml  Output 250 ml  Net -225 ml   Filed Weights   01/31/21 0806 01/31/21 1050 02/02/21 0500  Weight: 102.3 kg 100.8 kg 100.6 kg    Exam: Constitutional: NAD, calm, comfortable Respiratory: #6 XLT uncuffed trach 9/9; 28% trach collar, anterior lung sounds are clear to auscultation and no increased work of breathing.  Normal pulse oximetry readings Cardiovascular: Heart sounds S1-S2, no JVD, no peripheral edema, regular pulse Abdomen:  Abdomen is soft and nontender to palpation.  Normoactive bowel sounds.  Large amount of flatus in colostomy bag. Cortrak tube in place Genitourinary: Catheter in place with yellow urine noted in drainage bag Neurologic: CN 2-12 grossly intact. Sensation intact, Strength 3/5 x all 4 extremities.  Psychiatric: Normal judgment and insight. Alert and oriented x 3.     Assessment/Plan: Acute problems:  Acute hypoxic respiratory failure s/p tracheostomy due to aspiration pneumonia  Recurrent pansensitive Pseudomonas tracheobronchitis -Tolerating PMV.  Trach team wants to make sure patient more mobile with stronger vocalization before trying capping trials. -Continue Robinul  -SLP following for PMV training.  Funguria -Continue IV Diflucan -LD due 9/27  Acute supraumbilical abdominal pain 2/2 PSBO vs ileus = Resolved and were consistent with PSBO.  CT abdomen pelvis demonstrated no acute abnormalities  Urinary retention -Once completes course of Diflucan we will try to remove Foley and perform serial bladder scans to determine if patient able to void  Oral Candida -Completed Diflucan x 6 doses total   Acute renal failure on stage IIIb CKD: Presumed ischemic ATN -started CRRT 7/31 > iHD 8/12 s/p left IJ [facility] 8/15.   - Continue HD TTS thru L IJ TDC per nephrology.  Patient needs to tolerate sitting up in chair for dialysis treatments -Initially not expected to recover renal function -nephrology now documenting that he may be headed towards renal recovery.     High output ostomy, diarrhea:  -Infectious work-up negative -Recent PSBO simply caused by Imodium -will need to use cautiously   Anemia of critical illness, acute blood loss as well as anemia of chronic disease and iron deficiency:  - s/p 4u PRBCs this hospitalization  - IV iron TTS x8 doses per nephrology. 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 Benay Spice. Started CAPOX June 2022, reduced cepecitabine dose starting 11/19/2020 due to hand/foot syndrome, reduced oxaliplatin due to renal impairment and poor performance status.  - Dr. Benay Spice has followed during this hospitalization. Per note 8/2 "[pt] is undergoing curative therapy for colon cancer..." - Palliative care has  been involved during this hospitalization, though goals remain clear: full aggressive measures desired.    Moderate protein calorie malnutrition/dysphagia - Continue tube feeds via cortrack-SLP following.  Patient reports difficulty swallowing secondary to trach. 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 -Patient very motivated -continue OOB to chair TID -OT/PT note that if patient continues to make progress likely would be a candidate for CIR -Continue Oxy IR for back pain   Acute extensive right leg DVT:  -Did not have significant edema therefore did not require thrombolysis -Cortrak was de-clogged- resume Eliquis   New onset atrial fibrillation with RVR:  -Currently maintaining sinus rhythm w/o meds - Continue eliquis as above   Seizure disorder:  - Continue keppra   Hand/foot syndrome related to xeloda:  -Dose reduced in the outpatient setting and given acuity of current symptoms this medication has not been utilized during hospitalization - No infection noted at this time. Will continue moisturizing topical Tx   Acute metabolic encephalopathy, agitated delirium:  -Resolved - Continue clonazepam 0.42m per tube BID and prn-taper and DC as tolerated - Continue quetiapine 533mBID-taper and DC as tolerated   Uncontrolled T2DM on OHAs prior to admission with associated neuropathy:  HbA1c 8.7% at admission. - CBGs well-controlled currently, continue levemir 15u BID and SSI q4h.  - Continue gabapentin 183m q8h.   HTN:  - Continue norvasc   HLD:  - Continue atorvastatin   Thrombocytopenia:  -Transient likely due to critical illness. HIT Ab negative on 8/5.    Stress ulcer prophylaxis -Continue PPI but consider discontinuing since this medication can contribute to loose stools and diarrhea   ?Sepsis - initially thought to  be present on admission- has been ruled out.     Data Reviewed: Basic Metabolic Panel: Recent Labs  Lab 01/28/21 0247 01/29/21 0211 01/29/21 0751 01/29/21 1233 01/30/21 1959 01/31/21 0421 02/01/21 0410 02/02/21 0520  NA 133*   < > 133* 134* 134* 134* 134* 136  K 4.1   < > 3.8 3.7 4.2 4.0 3.7 3.7  CL 94*   < > 92* 95* 98 98 96* 97*  CO2 24   < > 26 25 19* _0 GLUCOSE 166*   < > 161* 210* 127* 144* 195* 124*  BUN 49*   < > 77* 31* 54* 58* 39* 59*  CREATININE 3.19*   < > 4.01* 2.16* 4.44* 4.63* 4.05* 5.24*  CALCIUM 8.8*   < > 9.2 8.6* 9.4 9.2 9.3 9.6  MG 2.2  --   --   --   --   --   --   --   PHOS 5.2*   < > 6.1*  --  3.0 5.8* 4.3 6.3*   < > = values in this interval not displayed.   Liver Function Tests: Recent Labs  Lab 01/29/21 1233 01/30/21 1959 01/31/21 0421 02/01/21 0410 02/02/21 0520  AST 25  --   --   --   --   ALT 31  --   --   --   --   ALKPHOS 116  --   --   --   --   BILITOT 0.6  --   --   --   --   PROT 7.9  --   --   --   --   ALBUMIN 2.7* 2.8* 2.6* 2.7* 2.6*    CBC: Recent Labs  Lab 01/26/21 1222 01/27/21 0734 01/28/21 0247 01/29/21 0751 01/29/21 1233 01/30/21 1937 01/31/21 0814  WBC 18.5*   < > 14.7* 14.1* 18.0* 23.9* 19.1*  NEUTROABS 14.9*  --   --   --  14.4* 16.9*  --   HGB 9.1*   < > 9.3* 8.3* 9.2* 9.2* 8.5*  HCT 26.8*   < > 27.3* 24.4* 27.3* 26.7* 25.5*  MCV 84.8   < > 84.8 85.6 86.4 84.5 86.1  PLT 319   < > 285 309 344 381 324   < > = values in this interval not displayed.    CBG: Recent Labs  Lab 02/01/21 1133 02/01/21 1536 02/01/21 2006 02/02/21 0039 02/02/21 0417  GLUCAP 178* 172* 181* 110* 126*    Scheduled Meds:  apixaban  5 mg Per Tube BID   chlorhexidine gluconate (MEDLINE KIT)  15 mL Mouth Rinse BID   Chlorhexidine Gluconate Cloth  6 each Topical Q0600   feeding supplement (NEPRO CARB STEADY)  1,000 mL Per Tube Q24H   feeding supplement (PROSource TF)  45 mL Per Tube BID   fiber  1 packet Per Tube BID    hydrocerin   Topical BID   insulin aspart  0-15 Units Subcutaneous Q4H   insulin detemir  10 Units Subcutaneous  Daily   levETIRAcetam  500 mg Per Tube BID   LORazepam  0.5 mg Per Tube BID   mouth rinse  15 mL Mouth Rinse 10 times per day   multivitamin  1 tablet Per Tube QHS   scopolamine  1 patch Transdermal Q72H   Continuous Infusions:  sodium chloride Stopped (12/20/20 0955)   anticoagulant sodium citrate     fluconazole (DIFLUCAN) IV 100 mg (02/01/21 1323)    Principal Problem:   Diabetes mellitus with hyperosmolarity without hyperglycemic hyperosmolar nonketotic coma ([place]) Active Problems:   Lactic acidosis   Acute renal failure superimposed on stage 2 chronic kidney disease (HCC)   Metabolic acidosis   Tachycardia   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 (HCC)   Adverse effect of chemotherapy   Pressure injury of skin   Atrial fibrillation with RVR (HCC)   Sepsis (HCC)   Malnutrition of moderate degree   Acute metabolic encephalopathy   Acute respiratory failure with hypoxemia (HCC)   Acute renal failure with acute cortical necrosis (HCC)   Shock ([place])   Status post tracheostomy ([place])   Endotracheal tube present   Hemodialysis status ([place])   Palliative care by specialist   SOB (shortness of breath)   Consultants: PCCM General surgery Nephrology Hematology/oncology Palliative care medicine team  Procedures: 2u PRBC 7/31, 1u PRBC 8/17, 1u PRBC 8/27 Tracheostomy revision 01/05/2021 Dr. Constance Holster [facility] 8/22    Antibiotics: Vancomycin 7/24 Cefepime 7/24 - 7/27 Unasyn 7/30 >> cefepime/flagyl 7/31 - 8/6     Time spent: 40 minutes    [name] ANP  [facility] 7 am - 330 pm/M-F for direct patient care and secure chat Please refer to Amion for contact info 63  days

## 2021-02-02 NOTE — Progress Notes (Signed)
Night RN reported cortrak feeding tube being clogged. She stated she attempted numerous times to unclog using protocol.  She stated night MD informed her that the cortrak team would have to place another tube today.  This RN reached out to cortrak team member Maylon Peppers, order was placed for cortrak tube to be replaced, pt was placed on the list for tube placement.   Current tube was not removed.  (Did attempt to flush tube again this am without success).

## 2021-02-02 NOTE — Progress Notes (Signed)
Speech Language Pathology Treatment: Dysphagia  Patient Details Name: Thomas [name] MRN: [id number] DOB: Feb 22, 1956 Today's Date: 02/02/2021 Time: 3976-7341 SLP Time Calculation (min) (ACUTE ONLY): 22 min  Assessment / Plan / Recommendation Clinical Impression  Pt was seen for dysphagia and PMSV treatment. He was alert and cooperative during he session. Oral care was provided by RN immediately prior to the SLP's arrival. Pt tolerated PMSV for the duration of the session with reduced breath support and intermittent hypernasality. Pt was able to improve respiratory support with moderate cues. Speech intelligibility was reduced due to articulatory imprecision. Oral care was provided by RN prior to the session again by this SLP. A reduction in dried secretions was noted upon oral inspection. However, pt continues to demonstrate limited lingual movement despite improved oral care and reduced dried secretions on the superior lingual surface. Pt demonstrated minimal manipulation of ice chips and, following repeated attempts at A-P transport, puree boluses were removed from the pt's oral cavity via suction. This SLP questions whether pt's very limited lingual mobility can be attributed solely to his oral care. This was discussed with the pt, and he was amenable to having an additional session today with the assistance of the SLP clinical specialist. SLP will continue to follow pt.    HPI HPI: Pt is a 65 y/o male admitted on 7/25 for worsening sepsis vs reaction to chemotherapy with high ostomy output and soft BP. Pt developed worsening hypoxia and was requiring BIPAP w/ FIO2 of 100% and increased work of breathing. SLP consulted due to concern for aspiration. BSE 7/28 recommended NPO status and MBS, but this could not completed subsequently due to lethargy. CXR in AM of 7/30 showed some worsening concern of aspiration. ETT 7/30-8/8; tolerated nasal cannula for about an hour; reintubated 8/8-trach 8/11. Pt pulled  out trach x2 8/22; replaced by PCCM. Bronch on 8/22 revealed endotracheal granulation tissue at trach site. Trach removed again on 8/25; unable to reinsert despite multiple attenpts; pt reintubated 8/25- trach by ENT on 8/29. BSE 8/22 limited to ice chips due to pt's refusal of other consistencies; rx NPO status with Cortrak. 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      SLP Plan  Continue with current plan of care      Recommendations for follow up therapy are one component of a multi-disciplinary discharge planning process, led by the attending physician.  Recommendations may be updated based on patient status, additional functional criteria and insurance authorization.    Recommendations  Diet recommendations: NPO (with allowance of ice chips.) Medication Administration: Via alternative means      Patient may use Passy-Muir Speech Valve: During all waking hours (remove during sleep) PMSV Supervision: Intermittent MD: Please consider changing trach tube to : Smaller size         Oral Care Recommendations: Oral care QID;Staff/trained caregiver to provide oral care Follow up Recommendations: Other (comment) (TBD) SLP Visit Diagnosis: Aphonia (R49.1);Dysphagia, unspecified (R13.10) Plan: Continue with current plan of care       Gaylen Venning I. Hardin Negus, [place], [place] Office number [phone] Pager [place]  02/02/2021, 1:19 PM

## 2021-02-02 NOTE — Progress Notes (Addendum)
NAME:  Thomas [name], MRN:  [id number], DOB:  1955-10-10, LOS: 67 ADMISSION DATE:  11/30/2020, CONSULTATION DATE:  7/30 REFERRING MD:  Riccardo Dubin, CHIEF COMPLAINT:  Worsening hypoxia/dyspnea  History of Present Illness:  65 yo M admitted 7/24 for worsening sepsis vs reaction to chemotherapy with high ostomy output and soft BP on 7/25. Has had issues during his hospitalization with recurrent aspirations and more recently worsening respiratory status.  Most recently pt developed worsening hypoxia and was requiring BIPAP w/ FIO2 of 100% and increased work of breathing.   He was admitted to intensive care and subsequently had worsening clinical status requiring intubation, CRRT, multiple pressors and pneumonia along with CT findings 8/1 concerning for ischemic bowel.  Significant Hospital Events:  7/27 Transferred to [facility], stopped abx as sepsis resolved/ruled out 7/30 AM Worsened AME, more hypoxic with worsening renal funciton, new aspiration on abx restarted 7/30 PM Pt with increased o2 requirement on 100% FIO2 on BIPAP, d/w family who wanted to proceed with intubation, pt intubated 7/31 Tracheal aspirate grew moderate candida tropicalis  8/1 Some issues with tachycardia and hypotension overnight. Reproted high ostomy output.  GI PCR negative 8/2 down to 9 mcg of Levophed, however repeat CT abdomen/pelvis showed inflammatory wall thickening and fat stranding of the descending and sigmoid colon, new since prior, consistent with nonspecific infectious, inflammatory, or ischemic colitis.  Also new dense consolidative airspace opacity in the right lower and right middle lobes 8/3 further weaning NE. Weaning sedation. Full code, full scope per family meeting 8/2. Eraxis stopped (8/1-8/3) 8/4 off pressors, plt down to 103.  8/5 remains off pressors. Moving around more, not really following commands though  8/9 failed extubation 8/8, tolerated nasal cannula for about an hour, desaturations and inability to  clear secretions-reintubated 8/8 8/10 some agitation overnight 8/11 tracheostomy placement On trach collar since 8/16 8/22 pulled trach out, replaced under bronch guidance. Has proximal area of granulation tissue that is above the bottom of the trach tube.  8/25 Removed Trach, unable to reinsert, despite multiple attempts re-intubated 8/29 Anemia, 1 unit PRBC. ENT re-placed Trach in [place]. 9/8 tracheal aspirate growing pan-sensitive P. Aeruginosa. Started on Ceftazidine  9/19 Continues to have thick secretions.   Interim History / Subjective:   Secretions have thinned. Still sounds rhonchorous. Had to be suctioned once overnight. Seems desperate to try to progress to decannulation since he believes he'd be able to eat solid food again without it.  Objective   Blood pressure 133/69, pulse (!) 117, temperature 98.4 F (36.9 C), temperature source Axillary, resp. rate (!) 22, height 5\' 8"  (1.727 m), weight 100.6 kg, SpO2 100 %.    FiO2 (%):  [28 %] 28 %   Intake/Output Summary (Last 24 hours) at 02/02/2021 1212 Last data filed at 02/01/2021 1323 Gross per 24 hour  Intake 25 ml  Output 250 ml  Net -225 ml   Filed Weights   01/31/21 0806 01/31/21 1050 02/02/21 0500  Weight: 102.3 kg 100.8 kg 100.6 kg     Physical Exam: General appearance: 65 y.o., male, NAD, chroincally ill appearing Eyes: anicteric sclerae, tracks HENT: NCAT; MMM Neck: prox 6 shiley XLT cuffless in place with trach collar; no lymphadenopathy Lungs: rhonchorous bl, with normal respiratory effort CV: tachycardic, RR, no MRGs  Abdomen: Soft, non-tender; non-distended, BS present  Extremities: trace edema, warm Psych: Appropriate affect Neuro: Alert, seems attentive, no focal deficit   CT A/P reviewed by me lung bases look clearer 9/23 than on prior  Resolved Hospital Problem list   Shock  HHS Afib RVR  Lactic Acidosis  Thrombocytopenia PsA tracheobronchitis and colonization Colitis  Assessment & Plan:   Acute hypoxemic respiratory failure s/p tracheostomy Endotracheal granulation tissue-  Trach revision by ENT on 8/29 to cuff-less trach Obstructive sleep apnea -Intermittently tolerating PMSV. I'm told there's no 4-0 prox XLT, will keep current 6-0 prox XLT shiley for now. As secretions have reportedly improved will discuss with RT to see if he'd be candidate for capping trial - I have some reservations about even trying given frequency of his recurrent respiratory failure requiring mechanical ventilation due to aspiration pneumonia but he's insistent that he thinks he'd be able to eat without a trach -Continue robinul, scopolamine patch -Continue routine trach care -Continue mobility and pulmonary hygiene -Will follow weekly for trach management.   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

## 2021-02-02 NOTE — Progress Notes (Signed)
Speech Language Pathology Treatment: Dysphagia  Patient Details Name: Thomas [name] MRN: [id number] DOB: 1955-11-04 Today's Date: 02/02/2021 Time: 1225-1300 SLP Time Calculation (min) (ACUTE ONLY): 35 min  Assessment / Plan / Recommendation Clinical Impression  Pt was seen for additional dysphagia treatment session with SLP's clinical specialist to further elucidate the etiology of pt's  dysphagia and limited lingual ROM. PMSV was donned throughout the session. Significant oral care was provided by SLPs and pt. Implements used included, but were not limited to, toothbrush, moistened washcloth, oral suction, and tweezers. Oral hygiene was improved, but a white coating was noted on pt's lingual superior surface with a white/clear portion of gelatinous material noted posteriorly. This was resistant to complete removal despite use of tweezers. Ritta Slot is suspected and, SLPs question whether this may be present more inferiorly as well and therefore impacting lingual movement. Following discussion with Dr. Cathlean Sauer, he advised that nystatin will be ordered. After repeated swishing and spitting of warm water, pt was able to expectorate some secretions orally and he reported, "it feels better". Pt was unable to demonstrate posterior propulsion/lateralization of an ice chip and this was ultimately removed from the oral cavity. Pt was given thin water via tsp towards the posterior of the oral cavity. A posterior head tilt position was used to assess pharyngeal function without significant need for lingual movement to facilitate A-P transport. Pt demonstrated swallowing of two boluses of thin liquids via tsp without overt s/sx of aspiration. Pt's NPO status will be maintained with continued allowance of ice chips after oral care. Frequent oral care is recommended and pt can perform this with minA. Tammy, RN updated regarding recommendations. SLP will continue dysphagia treatment with plan to perform FEES this week to  reassess swallow function.    HPI HPI: Pt is a 65 y/o male admitted on 7/25 for worsening sepsis vs reaction to chemotherapy with high ostomy output and soft BP. Pt developed worsening hypoxia and was requiring BIPAP w/ FIO2 of 100% and increased work of breathing. SLP consulted due to concern for aspiration. BSE 7/28 recommended NPO status and MBS, but this could not completed subsequently due to lethargy. CXR in AM of 7/30 showed some worsening concern of aspiration. ETT 7/30-8/8; tolerated nasal cannula for about an hour; reintubated 8/8-trach 8/11. Pt pulled out trach x2 8/22; replaced by PCCM. Bronch on 8/22 revealed endotracheal granulation tissue at trach site. Trach removed again on 8/25; unable to reinsert despite multiple attenpts; pt reintubated 8/25- trach by ENT on 8/29. BSE 8/22 limited to ice chips due to pt's refusal of other consistencies; rx NPO status with Cortrak. 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      SLP Plan  Continue with current plan of care      Recommendations for follow up therapy are one component of a multi-disciplinary discharge planning process, led by the attending physician.  Recommendations may be updated based on patient status, additional functional criteria and insurance authorization.    Recommendations  Diet recommendations: NPO (with allowance of ice chips following oral care) Medication Administration: Via alternative means      Patient may use Passy-Muir Speech Valve: During all waking hours (remove during sleep) PMSV Supervision: Intermittent MD: Please consider changing trach tube to : Smaller size  Oral Care Recommendations: Oral care QID;Patient independent with oral care  (supervision and minA needed) Follow up Recommendations: Skilled Nursing facility SLP Visit Diagnosis: Aphonia (R49.1);Dysphagia, unspecified (R13.10) Plan: Continue with current plan of care       Akhila Mahnken I. Hardin Negus, [place], [place] Office number [phone] Pager [place]  02/02/2021, 3:20 PM

## 2021-02-02 NOTE — Progress Notes (Signed)
[facility] Progress Note   65 year old gentleman with a history of colon cancer status post resection and colostomy May 2022.  Status post 3 cycles of chemotherapy last was in July 2022.  Type 2 diabetes with neuropathy hypertension hyperlipidemia presented to the emergency room 11/30/2020 with abdominal pain.  Was found to be secondary to sepsis.  Patient also was found to have acute kidney injury.  Developed atrial fibrillation with rapid ventricular rate and was admitted to intensive care unit for septic shock.  On 12/03/2020 he developed metabolic encephalopathy with hypoxia and aspiration pneumonia and required intubation 12/06/2020.  He also required pressor support.  He was reintubated since 12/15/2020 tracheostomy placed.  His hospital course  complicated by DVT.  It appears that he has recurrent Pseudomonas pneumonia.  He also has recurrent anemia requiring transfusion of 4 units of packed red blood cells.  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 CRRT initiated 7/31 and transitioned to IHD on 12/19/20.  S/p LIJ TDC placement 12/22/20.  We will closely monitor for signs of recovery.  There does seem to be some improvement in urine output.    Dialysis with 1.5 L removed 01/31/2021. Next dialysis will be 02/03/2021; renal function going wrong direction with last HD on Sat.       2.Urinary retention   Foley catheter placed     3.Volume overload/anasarca-improved with dialysis.   4.Sepsis/aspiration pneumonia/VDRF:aspiration pneumonia.  Tracheostomy placed   4.Stage IIIB colon cancer - sp partial colectomy, colostomy. On chemoRx sp 3 cycles.   5.Right leg DVT: On Eliquis.   6.Atrial fibrillation - on amiodarone and anticoagulation.     7.Anemia of critical illness: DC'ed  IV iron as concern for infection and on abx, transfuse as needed, holding erythropoietin with active  malignancy.  Monitor hemoglobin and transfuse as needed.   8. CKD-MBD; started sevelamer for hyperphosphatemia on 9/8, monitor phosphorus level.--improved 4.3-6.3   9. Disposition - poor overall prognosis.  Seen by palliative care team.  Discussion ongoing for possible transfer to [facility].  The tracheostomy should be capped before he can be accepted to [facility]  Subjective:   Feels not well, fatigued and tired but denies any worsening of dyspnea. Nonproductive cough.   Objective:   BP 133/69   Pulse (!) 117   Temp 98.4 F (36.9 C) (Axillary)   Resp (!) 22   Ht 5' 8" (1.727 m)   Wt 100.6 kg   SpO2 100%   BMI 33.72 kg/m   Intake/Output Summary (Last 24 hours) at 02/02/2021 1207 Last data filed at 02/01/2021 1323 Gross per 24 hour  Intake 25 ml  Output 250 ml  Net -225 ml   Weight change: -1.7 kg  Physical Exam: General: Ill-looking male, with tracheostomy on trach collar, not in distress Heart:RRR, s1s2 nl Lungs: normal wob Abdomen:soft, Non-tender, non-distended Extremities: No edema. Neurology: Alert awake and following commands. Dialysis Access: [facility] in place.     Imaging: No results found.  Labs: BMET Recent Labs  Lab 01/28/21 0247 01/29/21 0211 01/29/21 0751 01/29/21 1233 01/30/21 1959 01/31/21 0421 02/01/21 0410 02/02/21 0520  NA 133* 131* 133* 134* 134* 134* 134* 136  K 4.1 3.8 3.8 3.7 4.2 4.0 3.7 3.7  CL 94* 92* 92* 95* 98 98 96* 97*  CO2 _0 19* _1 GLUCOSE 166* 171* 161* 210* 127* 144* 195* 124*  BUN 49* 72* 77* 31* 54* 58* 39* 59*  CREATININE 3.19* 3.90* 4.01* 2.16* 4.44* 4.63* 4.05* 5.24*  CALCIUM 8.8* 9.2 9.2 8.6* 9.4 9.2 9.3 9.6  PHOS 5.2* 6.2* 6.1*  --  3.0 5.8* 4.3 6.3*   CBC Recent Labs  Lab 01/26/21 1222 01/27/21 0734 01/29/21 0751 01/29/21 1233 01/30/21 1937 01/31/21 0814  WBC 18.5*   < > 14.1* 18.0* 23.9* 19.1*  NEUTROABS 14.9*  --   --  14.4* 16.9*  --   HGB 9.1*   < > 8.3* 9.2* 9.2* 8.5*  HCT 26.8*   <  > 24.4* 27.3* 26.7* 25.5*  MCV 84.8   < > 85.6 86.4 84.5 86.1  PLT 319   < > 309 344 381 324   < > = values in this interval not displayed.    Medications:     apixaban  5 mg Per Tube BID   chlorhexidine gluconate (MEDLINE KIT)  15 mL Mouth Rinse BID   Chlorhexidine Gluconate Cloth  6 each Topical Q0600   feeding supplement (NEPRO CARB STEADY)  1,000 mL Per Tube Q24H   feeding supplement (PROSource TF)  45 mL Per Tube BID   fiber  1 packet Per Tube BID   hydrocerin   Topical BID   insulin aspart  0-15 Units Subcutaneous Q4H   insulin detemir  10 Units Subcutaneous Daily   levETIRAcetam  500 mg Per Tube BID   LORazepam  0.5 mg Per Tube BID   mouth rinse  15 mL Mouth Rinse 10 times per day   multivitamin  1 tablet Per Tube QHS   scopolamine  1 patch Transdermal Q72H       , MD 02/02/2021, 12:07 PM   

## 2021-02-03 ENCOUNTER — Inpatient Hospital Stay (HOSPITAL_COMMUNITY): Payer: Medicare HMO

## 2021-02-03 ENCOUNTER — Other Ambulatory Visit (HOSPITAL_COMMUNITY): Payer: Self-pay

## 2021-02-03 DIAGNOSIS — E1100 Type 2 diabetes mellitus with hyperosmolarity without nonketotic hyperglycemic-hyperosmolar coma (NKHHC): Secondary | ICD-10-CM | POA: Diagnosis not present

## 2021-02-03 LAB — GLUCOSE, CAPILLARY
Glucose-Capillary: 140 mg/dL — ABNORMAL HIGH (ref 70–99)
Glucose-Capillary: 145 mg/dL — ABNORMAL HIGH (ref 70–99)
Glucose-Capillary: 136 mg/dL — ABNORMAL HIGH (ref 70–99)
Glucose-Capillary: 187 mg/dL — ABNORMAL HIGH (ref 70–99)
Glucose-Capillary: 135 mg/dL — ABNORMAL HIGH (ref 70–99)

## 2021-02-03 LAB — CBC
HCT: 25.4 % — ABNORMAL LOW (ref 39.0–52.0)
Hemoglobin: 8.5 g/dL — ABNORMAL LOW (ref 13.0–17.0)
MCH: 28.6 pg (ref 26.0–34.0)
MCHC: 33.5 g/dL (ref 30.0–36.0)
MCV: 85.5 fL (ref 80.0–100.0)
Platelets: 381 10*3/uL (ref 150–400)
RBC: 2.97 MIL/uL — ABNORMAL LOW (ref 4.22–5.81)
RDW: 14.7 % (ref 11.5–15.5)
WBC: 18.7 10*3/uL — ABNORMAL HIGH (ref 4.0–10.5)
nRBC: 0 % (ref 0.0–0.2)

## 2021-02-03 LAB — RENAL FUNCTION PANEL
Albumin: 2.8 g/dL — ABNORMAL LOW (ref 3.5–5.0)
Anion gap: 16 — ABNORMAL HIGH (ref 5–15)
BUN: 79 mg/dL — ABNORMAL HIGH (ref 8–23)
CO2: 21 mmol/L — ABNORMAL LOW (ref 22–32)
Calcium: 9.4 mg/dL (ref 8.9–10.3)
Chloride: 98 mmol/L (ref 98–111)
Creatinine, Ser: 6.07 mg/dL — ABNORMAL HIGH (ref 0.61–1.24)
GFR, Estimated: 10 mL/min — ABNORMAL LOW (ref >60)
Glucose, Bld: 141 mg/dL — ABNORMAL HIGH (ref 70–99)
Phosphorus: 6.1 mg/dL — ABNORMAL HIGH (ref 2.5–4.6)
Potassium: 3.4 mmol/L — ABNORMAL LOW (ref 3.5–5.1)
Sodium: 135 mmol/L (ref 135–145)

## 2021-02-03 MED ORDER — HEPARIN SODIUM (PORCINE) 1000 UNIT/ML IJ SOLN
INTRAMUSCULAR | Status: AC
  Filled 2021-02-03: qty 4

## 2021-02-03 MED ORDER — PROCHLORPERAZINE EDISYLATE 10 MG/2ML IJ SOLN
10.0000 mg | Freq: Once | INTRAMUSCULAR | Status: AC
  Administered 2021-02-03: 10 mg via INTRAVENOUS
  Filled 2021-02-03 (×3): qty 2

## 2021-02-03 NOTE — Progress Notes (Signed)
Speech Language Pathology Treatment: Dysphagia  Patient Details Name: Thomas [name] MRN: [id number] DOB: 05/31/1955 Today's Date: 02/03/2021 Time: 1555-1610 SLP Time Calculation (min) (ACUTE ONLY): 15 min  Assessment / Plan / Recommendation Clinical Impression  Overall condition of pt's mouth looks improved today with regard to soft/hard palate, lateral sulci and posterior pharynx.  Area of thrush on superior surface of tongue remains with thick with gelatinous-like coating.  Nystatin applied by RN.  Basic oral care provided again by SLP and pt offered several sips of water.  He was quite anxious this afternoon, tearful and reluctant to participate. Provided encouragement.  He accepted several sips of water from straw, placed on posterior tongue since he has no discernable propulsion, and he swallowed using a posterior head tilt with no overt s/s of aspiration.  He required intermittent verbal cues for positioning and to seal lips around straw tip.  Pt with PMV in place and tolerating well. Continue to plan for FEES later this week. Continue to allow ice chips after oral care.  SLP will follow.    HPI HPI: Pt is a 65 y/o male admitted on 7/25 for worsening sepsis vs reaction to chemotherapy with high ostomy output and soft BP. Pt developed worsening hypoxia and was requiring BIPAP w/ FIO2 of 100% and increased work of breathing. SLP consulted due to concern for aspiration. BSE 7/28 recommended NPO status and MBS, but this could not completed subsequently due to lethargy. CXR in AM of 7/30 showed some worsening concern of aspiration. ETT 7/30-8/8; tolerated nasal cannula for about an hour; reintubated 8/8-trach 8/11. Pt pulled out trach x2 8/22; replaced by PCCM. Bronch on 8/22 revealed endotracheal granulation tissue at trach site. Trach removed again on 8/25; unable to reinsert despite multiple attenpts; pt reintubated 8/25- trach by ENT on 8/29. BSE 8/22 limited to ice chips due to pt's refusal of  other consistencies; rx NPO status with Cortrak. 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      SLP Plan  Continue with current plan of care      Recommendations for follow up therapy are one component of a multi-disciplinary discharge planning process, led by the attending physician.  Recommendations may be updated based on patient status, additional functional criteria and insurance authorization.    Recommendations  Diet recommendations: NPO (sips and chips) Liquids provided via: Teaspoon Medication Administration: Via alternative means      Patient may use Passy-Muir Speech Valve: During all waking hours (remove during sleep) PMSV Supervision: Intermittent         Oral Care Recommendations: Oral care QID;Patient independent with oral care (encourage pt to complete own oral care) Follow up Recommendations: Skilled Nursing facility SLP Visit Diagnosis: Aphonia (R49.1);Dysphagia, unspecified (R13.10) Plan: Continue with current plan of care       [place]. Tivis Ringer, [place] Office number [phone] Pager [phone]   Thomas [name]  02/03/2021, 4:24 PM

## 2021-02-03 NOTE — Progress Notes (Signed)
[facility] PROGRESS NOTE  JARI DIPASQUALE WYO:[id number] DOB: Dec 03, 1955 DOA: 11/30/2020 PCP: [name], NP  Status: Remains inpatient appropriate because:Persistent severe electrolyte disturbances, Unsafe d/c plan, and Inpatient level of care appropriate due to severity of illness  Dispo: The patient is from: Home              Anticipated d/c is to: SNF versus LTAC vs CIR              Patient currently is not medically stable to d/c.   Difficult to place patient Yes               Barriers to discharge: Currently has tracheostomy in place and is requiring dialysis but remains too weak to tolerate dialysis in chair which is a requirement for outpatient HD    Level of care: Progressive  Code Status: Full Family Communication: Attending physician spoke with wife at bedside 9/26 DVT prophylaxis: Eliquis COVID vaccination status: Moderna 07/08/2019 and 08/11/2019 with first booster dose given 08/05/2020   HPI:  65 y.o. male with a history of T2DM with neuropathy, HTN, HLD., colon cancer s/p resection and colostomy May 2022 s/p 3 cycles chemotherapy (last was July 2022). Followed by Dr. Benay Spice. Started CAPOX June 2022, reduced cepecitabine dose starting 11/19/2020 due to hand/foot syndrome, reduced oxaliplatin due to renal impairment and poor performance status. Presented to the ED 7/24 with abdominal pain found to be febrile, tachycardic, tachypneic. Lactic acid was 6.6 and persisted at 6.8 after sepsis IV fluid bolus and cefepime. He had ARF, demand ischemia, bandemia, was hyperglycemic with negative ketones, with lactic acidosis. He developed AFib with RVR in the ED, was started on diltiazem, and was admitted to the ICU for septic shock with unclear source. Sepsis was subsequently felt to be ruled out at time of admission, presentation felt more likely to be related to cepacitabine toxicity with oral mucositis, hand/foot syndrome, and diarrhea/high output colostomy.   He was transferred  to hospitalist service 7/27 but developed metabolic encephalopathy with hyoxia due to aspiration pneumonia on 7/30 requiring intubation and pressor support. Failed extubation with inability to clear secretions, requiring reintubation 8/8, and tracheostomy subsequently placed 8/11. This was pulled out 8/22, replaced under bronch guidance, again removed 8/25 requiring reintubation after failed reinsertion. ENT revised tracheostomy in the OR 8/29. He has required IHD for ARF, and also had complications of RLE DVT now on eliquis, colitis thought to be ischemic 8/2 not requiring specific intervention, Pseudomonas pneumonia having completed treatment, agitated encephalopathy which has improved, anemia requiring 4u PRBCs total, and thrombocytopenia which has resolved. On 9/4 he was transferred to hospitalist service having tolerated trach collar.  Patient was also evaluated by Dr. Benay Spice during this admission clarified that patient was receiving curative chemotherapy prior to admission  Subjective: Awake and complaining of sensation of mucus being stuck in the bottom of his throat and unable to expectorate or spit out.  Overall sensation of dry mouth.  Objective: Vitals:   02/03/21 0410 02/03/21 0723  BP:  (!) 151/75  Pulse: 93 93  Resp: 18 20  Temp:  (!) 97.2 F (36.2 C)  SpO2: 98% 100%    Intake/Output Summary (Last 24 hours) at 02/03/2021 0745 Last data filed at 02/03/2021 0600 Gross per 24 hour  Intake 992.5 ml  Output 750 ml  Net 242.5 ml   Filed Weights   01/31/21 0806 01/31/21 1050 02/02/21 0500  Weight: 102.3 kg 100.8 kg 100.6 kg    Exam: Constitutional:  NAD, calm, uncomfortable due to dry mouth Respiratory: #6 XLT uncuffed trach 9/9; 28% trach collar, lung sounds remain clear to auscultation without any increased work of breathing, no excessive tracheal secretions, skin warm and dry, normal pulse oximetry. Cardiovascular: S1-S2, no JVD, no peripheral edema, regular pulse Abdomen:  Abdomen is soft and nontender,  Normoactive bowel sounds.  Large amount of flatus in colostomy bag. Cortrak tube in place Genitourinary: Foley catheter in place with yellow urine noted in drainage bag Neurologic: CN 2-12 grossly intact. Sensation intact, Strength 3/5 x all 4 extremities.  Psychiatric: Normal judgment and insight. Alert and oriented x 3.    Assessment/Plan: Acute problems:  Acute hypoxic respiratory failure s/p tracheostomy due to aspiration pneumonia  Recurrent pansensitive Pseudomonas tracheobronchitis -Tolerating PMV.  Trach team wants to make sure patient more mobile with stronger vocalization before trying capping trials. -Continue Robinul  -SLP following for PMV training.  Funguria -Continue IV Diflucan -LD due 9/27  Thrush -Initiated nystatin swish and swallow on 9/26 -It is suspected this is contributing to patient's difficulty swallowing/tongue motility  Urinary retention -Completes Diflucan today.  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   - Continue HD TTS thru L IJ TDC per nephrology.  Patient needs to tolerate sitting up in chair for dialysis treatments -Initially not expected to recover renal function -nephrology now documenting that he may be headed towards renal recovery.     High output ostomy, diarrhea:  -Infectious work-up negative -Recent PSBO simply caused by Imodium -will need to use cautiously   Anemia of critical illness, acute blood loss as well as anemia of chronic disease and iron deficiency:  - s/p 4u PRBCs this hospitalization  - IV iron TTS x8 doses per nephrology. -Current hemoglobin stable at 10.6 with normal platelets   Stage IIIb colon CA s/p partial transverse colectomy with end colostomy May 2022:  - Palliative care has been  involved during this hospitalization- goals remain clear: full aggressive measures desired.  Documented patient was on curative chemotherapy prior to admission   Moderate protein calorie malnutrition/dysphagia - Continue tube feeds via cortrack-SLP following.  Patient reports difficulty swallowing secondary to trach. 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   Uncontrolled T2DM on OHAs prior to admission with associated neuropathy:  HbA1c 8.7% at admission. - Continue gabapentin, levemir and SSI    HTN:  - Continue norvasc   HLD:  - Continue atorvastatin   Thrombocytopenia:  -Transient likely due to critical illness. HIT Ab negative on 8/5.    ?Sepsis - initially thought to be present on admission- has been ruled out.     Data Reviewed: Basic Metabolic Panel: Recent Labs  Lab 01/28/21 0247 01/29/21 0211 01/29/21 0751 01/29/21 1233 01/30/21  1959 01/31/21 0421 02/01/21 0410 02/02/21 0520  NA 133*   < > 133* 134* 134* 134* 134* 136  K 4.1   < > 3.8 3.7 4.2 4.0 3.7 3.7  CL 94*   < > 92* 95* 98 98 96* 97*  CO2 24   < > 26 25 19* 23 23 23   GLUCOSE 166*   < > 161* 210* 127* 144* 195* 124*  BUN 49*   < > 77* 31* 54* 58* 39* 59*  CREATININE 3.19*   < > 4.01* 2.16* 4.44* 4.63* 4.05* 5.24*  CALCIUM 8.8*   < > 9.2 8.6* 9.4 9.2 9.3 9.6  MG 2.2  --   --   --   --   --   --   --   PHOS 5.2*   < > 6.1*  --  3.0  5.8* 4.3 6.3*   < > = values in this interval not displayed.   Liver Function Tests: Recent Labs  Lab 01/29/21 1233 01/30/21 1959 01/31/21 0421 02/01/21 0410 02/02/21 0520  AST 25  --   --   --   --   ALT 31  --   --   --   --   ALKPHOS 116  --   --   --   --   BILITOT 0.6  --   --   --   --   PROT 7.9  --   --   --   --   ALBUMIN 2.7* 2.8* 2.6* 2.7* 2.6*    CBC: Recent Labs  Lab 01/28/21 0247 01/29/21 0751 01/29/21 1233 01/30/21 1937 01/31/21 0814  WBC 14.7* 14.1* 18.0* 23.9* 19.1*  NEUTROABS  --   --  14.4* 16.9*  --   HGB 9.3* 8.3* 9.2* 9.2* 8.5*  HCT 27.3* 24.4* 27.3* 26.7* 25.5*  MCV 84.8 85.6 86.4 84.5 86.1  PLT 285 309 344 381 324    CBG: Recent Labs  Lab 02/02/21 1619 02/02/21 2025 02/02/21 2318 02/03/21 0641 02/03/21 0726  GLUCAP 148* 127* 186* 145* 140*    Scheduled Meds:  apixaban  5 mg Per Tube BID   chlorhexidine gluconate (MEDLINE KIT)  15 mL Mouth Rinse BID   Chlorhexidine Gluconate Cloth  6 each Topical Q0600   feeding supplement (NEPRO CARB STEADY)  1,000 mL Per Tube Q24H   feeding supplement (PROSource TF)  45 mL Per Tube BID   fiber  1 packet Per Tube BID   hydrocerin   Topical BID   insulin aspart  0-15 Units Subcutaneous Q4H   insulin detemir  10 Units Subcutaneous Daily   levETIRAcetam  500 mg Per Tube BID   LORazepam  0.5 mg Per Tube BID   mouth rinse  15 mL Mouth Rinse 10 times per day   multivitamin  1 tablet Per Tube QHS   nystatin  5 mL Mouth/Throat QID   scopolamine  1 patch Transdermal Q72H   Continuous Infusions:  sodium chloride Stopped (12/20/20 0955)   anticoagulant sodium citrate      Principal Problem:   Diabetes mellitus with hyperosmolarity without hyperglycemic hyperosmolar nonketotic coma (HCC) Active Problems:   Lactic acidosis   Acute renal failure superimposed on stage 2 chronic kidney disease (HCC)   Metabolic acidosis   Tachycardia   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  (HCC)   Adverse effect of chemotherapy   Pressure injury of skin   Atrial fibrillation with RVR ([place])   Sepsis ([place])  Malnutrition of moderate degree   Acute metabolic encephalopathy   Acute respiratory failure with hypoxemia (HCC)   Acute renal failure with acute cortical necrosis (HCC)   Shock ([place])   Status post tracheostomy ([place])   Endotracheal tube present   Hemodialysis status ([place])   Palliative care by specialist   SOB (shortness of breath)   Consultants: PCCM General surgery Nephrology Hematology/oncology Palliative care medicine team  Procedures: 2u PRBC 7/31, 1u PRBC 8/17, 1u PRBC 8/27 Tracheostomy revision 01/05/2021 Dr. Constance Holster [facility] 8/22    Antibiotics: Vancomycin 7/24 Cefepime 7/24 - 7/27 Unasyn 7/30 >> cefepime/flagyl 7/31 - 8/6     Time spent: 40 minutes    [name] ANP  [facility] 7 am - 330 pm/M-F for direct patient care and secure chat Please refer to Amion for contact info 64  days

## 2021-02-03 NOTE — Progress Notes (Signed)
[facility] Progress Note   65 year old gentleman with a history of colon cancer status post resection and colostomy May 2022.  Status post 3 cycles of chemotherapy last was in July 2022.  Type 2 diabetes with neuropathy hypertension hyperlipidemia presented to the emergency room 11/30/2020 with abdominal pain.  Was found to be secondary to sepsis.  Patient also was found to have acute kidney injury.  Developed atrial fibrillation with rapid ventricular rate and was admitted to intensive care unit for septic shock.  On 12/03/2020 he developed metabolic encephalopathy with hypoxia and aspiration pneumonia and required intubation 12/06/2020.  He also required pressor support.  He was reintubated since 12/15/2020 tracheostomy placed.  His hospital course  complicated by DVT.  It appears that he has recurrent Pseudomonas pneumonia.  He also has recurrent anemia requiring transfusion of 4 units of packed red blood cells.  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 CRRT initiated 7/31 and transitioned to IHD on 12/19/20.  S/p LIJ TDC placement 12/22/20.  We will closely monitor for signs of recovery.  There does seem to be some improvement in urine output.    Dialysis with 1.5 L removed 01/31/2021. Next dialysis will be 02/03/2021; renal function going wrong direction with last HD on Sat. Will have HD draw labs on the patient.      2.Urinary retention   Foley catheter placed     3.Volume overload/anasarca-improved with dialysis.   4.Sepsis/aspiration pneumonia/VDRF:aspiration pneumonia.  Tracheostomy placed   4.Stage IIIB colon cancer - sp partial colectomy, colostomy. On chemoRx sp 3 cycles.   5.Right leg DVT: On Eliquis.   6.Atrial fibrillation - on amiodarone and anticoagulation.     7.Anemia of critical illness: DC'ed  IV iron as concern for infection and on abx, transfuse as needed, holding  erythropoietin with active malignancy.  Monitor hemoglobin and transfuse as needed.   8. CKD-MBD; started sevelamer for hyperphosphatemia on 9/8, monitor phosphorus level.--improved 4.3-6.3   9. Disposition - poor overall prognosis.  Seen by palliative care team.  Discussion ongoing for possible transfer to LTAC/[facility].  The tracheostomy should be capped before he can be accepted to [facility]  Subjective:   Feels not well, fatigued and tired but denies any worsening of dyspnea. Nonproductive cough. Nausea and vomiting after medication this am.   Objective:   BP (!) 151/75 (BP Location: Left Arm)   Pulse 93   Temp (!) 97.2 F (36.2 C) (Axillary)   Resp 20   Ht 5' 8"  (1.727 m)   Wt 100.6 kg   SpO2 100%   BMI 33.72 kg/m   Intake/Output Summary (Last 24 hours) at 02/03/2021 0801 Last data filed at 02/03/2021 0600 Gross per 24 hour  Intake 992.5 ml  Output 750 ml  Net 242.5 ml   Weight change:   Physical Exam: General: Ill-looking male, with tracheostomy on trach collar, not in distress Heart:RRR, s1s2 nl Lungs: normal wob Abdomen:soft, Non-tender, non-distended Extremities: No edema. Neurology: Alert awake and following commands. Dialysis Access: [facility] in place.     Imaging: DG Abd 1 View  Result Date: 02/03/2021 CLINICAL DATA:  Small-bowel obstruction EXAM: ABDOMEN - 1 VIEW COMPARISON:  None. FINDINGS: Nasoenteric feeding tube tip is seen within the gastric antrum. Normal abdominal gas pattern. No free intraperitoneal gas. IMPRESSION: Nasoenteric feeding tube tip within the gastric antrum. Electronically Signed   By: Linwood Dibbles.D.  On: 02/03/2021 01:43    Labs: BMET Recent Labs  Lab 01/28/21 0247 01/29/21 0211 01/29/21 0751 01/29/21 1233 01/30/21 1959 01/31/21 0421 02/01/21 0410 02/02/21 0520  NA 133* 131* 133* 134* 134* 134* 134* 136  K 4.1 3.8 3.8 3.7 4.2 4.0 3.7 3.7  CL 94* 92* 92* 95* 98 98 96* 97*  CO2 24 24 26 25  19* 23 23 23   GLUCOSE 166* 171*  161* 210* 127* 144* 195* 124*  BUN 49* 72* 77* 31* 54* 58* 39* 59*  CREATININE 3.19* 3.90* 4.01* 2.16* 4.44* 4.63* 4.05* 5.24*  CALCIUM 8.8* 9.2 9.2 8.6* 9.4 9.2 9.3 9.6  PHOS 5.2* 6.2* 6.1*  --  3.0 5.8* 4.3 6.3*   CBC Recent Labs  Lab 01/29/21 0751 01/29/21 1233 01/30/21 1937 01/31/21 0814  WBC 14.1* 18.0* 23.9* 19.1*  NEUTROABS  --  14.4* 16.9*  --   HGB 8.3* 9.2* 9.2* 8.5*  HCT 24.4* 27.3* 26.7* 25.5*  MCV 85.6 86.4 84.5 86.1  PLT 309 344 381 324    Medications:     apixaban  5 mg Per Tube BID   chlorhexidine gluconate (MEDLINE KIT)  15 mL Mouth Rinse BID   Chlorhexidine Gluconate Cloth  6 each Topical Q0600   feeding supplement (NEPRO CARB STEADY)  1,000 mL Per Tube Q24H   feeding supplement (PROSource TF)  45 mL Per Tube BID   fiber  1 packet Per Tube BID   hydrocerin   Topical BID   insulin aspart  0-15 Units Subcutaneous Q4H   insulin detemir  10 Units Subcutaneous Daily   levETIRAcetam  500 mg Per Tube BID   LORazepam  0.5 mg Per Tube BID   mouth rinse  15 mL Mouth Rinse 10 times per day   multivitamin  1 tablet Per Tube QHS   nystatin  5 mL Mouth/Throat QID   scopolamine  1 patch Transdermal Q72H      Otelia Santee, MD 02/03/2021, 8:01 AM

## 2021-02-03 NOTE — Progress Notes (Signed)
Occupational Therapy Treatment Patient Details Name: Thomas [name] MRN: [id number] DOB: [birth year] Today's Date: 02/03/2021   History of present illness 65 y.o. male admitted 7/24 with high ostomy output. Pt with severe sepsis and multisystem organ failure. Hospital admission complicated by volume overload, aspiration pneumonia, acute respiratory failure, AKI and ischemic colitis. Intubated 7/30. S/p trach placement 8/11. CorTrak 8/12. HD initiated 8/12. To IR 8/15 for tunneled HD cath. Pulled out trach x2 8/22 replaced by PCCM. Pulled out trach again 8/25 but unable to be reinserted with pt intubated. 8/29 to OR for stoma clean up and trach replacement.Trach downsized 9/10.  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   OT comments  Patient received supine in bed.  Patient states he did not sleep well last night and had episodes of vomiting. Patient agreed to sit on eob and perform grooming and UE exercises. Patient was supervision to get to eob from supine and to perform grooming and theraband exercises. Patient required min assist to return to supine.  Acute OT to continue to follow.    Recommendations for follow up therapy are one component of a multi-disciplinary discharge planning process, led by the attending physician.  Recommendations may be updated based on patient status, additional functional criteria and insurance authorization.    Follow Up Recommendations  SNF;Supervision/Assistance - 24 hour    Equipment Recommendations       Recommendations for Other Services      Precautions / Restrictions Precautions Precautions: Fall Precaution Comments: cortrak, R subclavian tunneled HD cath, ostomy, O2 via trach collar       Mobility Bed Mobility Overal bed mobility: Needs  Assistance Bed Mobility: Supine to Sit;Sit to Supine     Supine to sit: Supervision Sit to supine: Min assist   General bed mobility comments: HOB up and assistance with LE for sit to supine    Transfers                      Balance Overall balance assessment: Needs assistance Sitting-balance support: No upper extremity supported;Bilateral upper extremity supported;Feet supported Sitting balance-Leahy Scale: Good                                     ADL either performed or assessed with clinical judgement   ADL       Grooming: Wash/dry hands;Wash/dry face;Supervision/safety;Sitting Grooming Details (indicate cue type and reason): performed sitting on eob                               General ADL Comments: patient not feeling well, agreed to perform grooming sitting on eob     Vision       Perception     Praxis      Cognition Arousal/Alertness: Awake/alert Behavior During Therapy: Flat affect Overall Cognitive Status: Impaired/Different from baseline Area of Impairment: Problem solving                             Problem Solving: Slow processing General Comments: states he had a bad night, vomiting and lack of sleep        Exercises Exercises: General  Upper Extremity General Exercises - Upper Extremity Shoulder Flexion: Strengthening;Both;10 reps;Supine;Theraband Theraband Level (Shoulder Flexion): Level 1 (Yellow) Shoulder ABduction: Strengthening;Both;10 reps;Supine;Theraband Theraband Level (Shoulder Abduction): Level 1 (Yellow) Elbow Flexion: Strengthening;Both;15 reps;Seated;Theraband Theraband Level (Elbow Flexion): Level 1 (Yellow) Elbow Extension: Strengthening;Both;15 reps;Seated;Theraband Theraband Level (Elbow Extension): Level 1 (Yellow)   Shoulder Instructions       General Comments      Pertinent Vitals/ Pain       Pain Assessment: Faces Faces Pain Scale: Hurts little more Pain  Location: abdomen Pain Descriptors / Indicators: Aching;Guarding Pain Intervention(s): Repositioned  Home Living                                          Prior Functioning/Environment              Frequency  Min 2X/week        Progress Toward Goals  OT Goals(current goals can now be found in the care plan section)  Progress towards OT goals: Progressing toward goals  Acute Rehab OT Goals Patient Stated Goal: to get out of here and get home. OT Goal Formulation: With patient Time For Goal Achievement: 02/06/21 Potential to Achieve Goals: Good ADL Goals Pt Will Perform Grooming: with min guard assist;standing Pt Will Perform Upper Body Dressing: with min assist;sitting Pt Will Perform Lower Body Dressing: with mod assist;sit to/from stand Pt Will Transfer to Toilet: with supervision;ambulating;bedside commode Pt Will Perform Toileting - Clothing Manipulation and hygiene: with supervision;sit to/from stand Pt Will Perform Tub/Shower Transfer: Tub transfer;Shower transfer;3 in Energy manager will Perform Home Exercise Program: Increased ROM;Increased strength;Right Upper extremity;Left upper extremity;With written HEP provided;Independently Additional ADL Goal #1: Pt will demosntrate increased activity tolerance to perform two standing groomign tasks at sink with Min Guard A Additional ADL Goal #2: Pat[place] will complete sit to stand transfers with Mod A +2 in prep for ADLs. Additional ADL Goal #3: Patient will attend to 2/3 grooming tasks without cues for continuation indicating increased attention to task.  Plan Discharge plan remains appropriate    Co-evaluation                 AM-PAC OT "6 Clicks" Daily Activity     Outcome Measure   Help from another person eating meals?: Total Help from another person taking care of personal grooming?: A Little Help from another person toileting, which includes using toliet, bedpan, or  urinal?: A Lot Help from another person bathing (including washing, rinsing, drying)?: A Lot Help from another person to put on and taking off regular upper body clothing?: A Lot Help from another person to put on and taking off regular lower body clothing?: A Lot 6 Click Score: 12    End of Session Equipment Utilized During Treatment: Oxygen  OT Visit Diagnosis: Unsteadiness on feet (R26.81);Muscle weakness (generalized) (M62.81);Pain Pain - Right/Left: Left Pain - part of body:  (side of abdomin)   Activity Tolerance Patient limited by fatigue   Patient Left in bed;with call bell/phone within reach;with bed alarm set;with nursing/sitter in room   Nurse Communication Other (comment) (discussed patient stating he did not sleep well)        Time:  -     Charges:    Lodema Hong, Woodman 02/03/2021, 8:22 AM

## 2021-02-03 NOTE — Progress Notes (Signed)
Hemodialysis- Treatment stopped due to patient request. Patient is alert and oriented and does not want to continue dialysis today due to anxiety and discomfort. Repostioned in bed. Offered medication. Patient declines. States "Get me out of here." Report called to primary RN

## 2021-02-04 ENCOUNTER — Inpatient Hospital Stay (HOSPITAL_COMMUNITY): Payer: Medicare HMO

## 2021-02-04 DIAGNOSIS — R471 Dysarthria and anarthria: Secondary | ICD-10-CM | POA: Diagnosis not present

## 2021-02-04 DIAGNOSIS — E1100 Type 2 diabetes mellitus with hyperosmolarity without nonketotic hyperglycemic-hyperosmolar coma (NKHHC): Secondary | ICD-10-CM | POA: Diagnosis not present

## 2021-02-04 LAB — RENAL FUNCTION PANEL
Albumin: 3 g/dL — ABNORMAL LOW (ref 3.5–5.0)
Anion gap: 15 (ref 5–15)
BUN: 48 mg/dL — ABNORMAL HIGH (ref 8–23)
CO2: 25 mmol/L (ref 22–32)
Calcium: 9.3 mg/dL (ref 8.9–10.3)
Chloride: 95 mmol/L — ABNORMAL LOW (ref 98–111)
Creatinine, Ser: 4.86 mg/dL — ABNORMAL HIGH (ref 0.61–1.24)
GFR, Estimated: 13 mL/min — ABNORMAL LOW (ref >60)
Glucose, Bld: 146 mg/dL — ABNORMAL HIGH (ref 70–99)
Phosphorus: 5 mg/dL — ABNORMAL HIGH (ref 2.5–4.6)
Potassium: 3.6 mmol/L (ref 3.5–5.1)
Sodium: 135 mmol/L (ref 135–145)

## 2021-02-04 LAB — GLUCOSE, CAPILLARY
Glucose-Capillary: 140 mg/dL — ABNORMAL HIGH (ref 70–99)
Glucose-Capillary: 146 mg/dL — ABNORMAL HIGH (ref 70–99)
Glucose-Capillary: 149 mg/dL — ABNORMAL HIGH (ref 70–99)
Glucose-Capillary: 137 mg/dL — ABNORMAL HIGH (ref 70–99)
Glucose-Capillary: 134 mg/dL — ABNORMAL HIGH (ref 70–99)
Glucose-Capillary: 159 mg/dL — ABNORMAL HIGH (ref 70–99)

## 2021-02-04 LAB — MAGNESIUM: Magnesium: 2 mg/dL (ref 1.7–2.4)

## 2021-02-04 MED ORDER — ENOXAPARIN SODIUM 100 MG/ML IJ SOSY
100.0000 mg | PREFILLED_SYRINGE | INTRAMUSCULAR | Status: DC
  Administered 2021-02-04: 100 mg via SUBCUTANEOUS
  Filled 2021-02-04: qty 1

## 2021-02-04 MED ORDER — DEXTROSE 5 % IV SOLN: INTRAVENOUS | Status: DC

## 2021-02-04 MED ORDER — POTASSIUM CHLORIDE 20 MEQ PO PACK
40.0000 meq | PACK | Freq: Two times a day (BID) | ORAL | Status: AC
  Administered 2021-02-04 (×2): 40 meq via ORAL
  Filled 2021-02-04 (×2): qty 2

## 2021-02-04 MED ORDER — FENTANYL CITRATE PF 50 MCG/ML IJ SOSY
12.5000 ug | PREFILLED_SYRINGE | Freq: Four times a day (QID) | INTRAMUSCULAR | Status: DC | PRN
  Administered 2021-02-04 – 2021-02-09 (×8): 12.5 ug via INTRAVENOUS
  Filled 2021-02-04 (×8): qty 1

## 2021-02-04 MED ORDER — POLYETHYLENE GLYCOL 3350 17 G PO PACK
17.0000 g | PACK | Freq: Two times a day (BID) | ORAL | Status: DC
  Administered 2021-02-04 (×2): 17 g via ORAL
  Filled 2021-02-04 (×2): qty 1

## 2021-02-04 MED ORDER — PROMETHAZINE HCL 6.25 MG/5ML PO SYRP
12.5000 mg | ORAL_SOLUTION | Freq: Four times a day (QID) | ORAL | Status: DC | PRN
  Administered 2021-02-04: 12.5 mg via ORAL
  Filled 2021-02-04 (×2): qty 10

## 2021-02-04 NOTE — Consult Note (Signed)
Neurology Consultation  Reason for Consult: Tongue paralysis with dysphagia Referring Physician: A. Lissa Merlin, NP  CC: "I cannot stick my tongue out"  History is obtained from: Chart review, patient  HPI: Thomas [name] is a 65 y.o. male with a medical history significant for essential hypertension, hyperlipidemia, epilepsy on Keppra, type 2 diabetes mellitus with peripheral neuropathy, frontal meningioma s/p craniotomy 2018, GERD, MCA stroke, stage 3 colon cancer s/p partial colectomy and end colostomy on 09/10/2020, and chemotherapy with 3rd cycle in July 2022 who presented to the ED 7/24 for evaluation of abdominal pain with fever, tachycardia, tachypnea, elevated lactate was found to be septic.  While in the ED patient developed atrial fibrillation with RVR and was admitted to the ICU for septic shock with unclear source, however it is felt that sepsis was unlikely at the time of admission and that presentation was more likely related to cepacitabine toxicity with oral mucositis and hand/foot syndrome.  Hospitalization has been complicated by metabolic encephalopathy with hypoxia and respiratory failure due to aspiration pneumonia on 7/30 requiring intubation with a failed extubation and inability to control secretions on 8/8 with a subsequent tracheostomy on 8/11.  Tracheostomy has been pulled out and replaced multiple times last replaced in the OR 8/29.  While inpatient, patient has required HD for acute renal failure and was found to have a right lower extremity DVT on Eliquis and recurrent Pseudomonas pneumonia.  Due to ongoing concerns affecting his ability to lift his tongue to speak and swallow neurology was consulted for further evaluation.  Patient endorses that his tongue weakness began 8 weeks ago.  He states that he wants his NGT removed and states that if his tongue film improves his tongue will have more mobility and he will be able to swallow on his own.  ROS: A complete ROS was performed and  is negative except as noted in the HPI.   Past Medical History:  Diagnosis Date   Acute ischemic left MCA stroke ([place]) 04/09/2014   Acute respiratory failure with hypoxia ([place]) 04/09/2014   Aspiration pneumonia ([place])    Asthma    as a child   Brain tumor ([place])    frontal   Cancer ([place])    Confusion    occasionally   Diabetes mellitus without complication ([place])    takes Metformin daily.   Dizziness    Dyspnea    pt states d/t weight   Epilepsy ([place])    takes Keppra daily   GERD (gastroesophageal reflux disease)    takes Omeprazole daily   Headache    Hyperlipidemia    takes Atorvastatin daily   Hypertension    takes Lotrel daily   Peripheral edema    takes Lasix daily   Peripheral neuropathy    takes Gabapentin daily   Pneumonia 3 yrs ago   hx of   Seizures ([place])    Sleep apnea    Urinary frequency    Past Surgical History:  Procedure Laterality Date   BIOPSY  09/05/2020   Procedure: BIOPSY;  Surgeon: Irene Shipper, MD;  Location: [facility] ENDOSCOPY;  Service: Endoscopy;;   CARDIAC CATHETERIZATION  7 yrs ago   COLONOSCOPY WITH PROPOFOL N/A 09/05/2020   Procedure: COLONOSCOPY WITH PROPOFOL;  Surgeon: Irene Shipper, MD;  Location[facility] ENDOSCOPY;  Service: Endoscopy;  Laterality: N/A;   CRANIOTOMY N/A 09/01/2016   Procedure: CRANIOTOMY TUMOR  LEFT PTERIONAL;  Surgeon: Ashok Pall, MD;  Location: [place];  Service: Neurosurgery;  Laterality: N/A;  cyst removed from chest      as a child   IR FLUORO GUIDE CV LINE LEFT  12/22/2020   IR US GUIDE VASC ACCESS LEFT  12/22/2020   NO PAST SURGERIES     PARTIAL COLECTOMY N/A 09/10/2020   Procedure: TRANSVERSE COLECTOMY;  Surgeon: [name], MD;  Location: [place];  Service: General;  Laterality: N/A;   POLYPECTOMY  09/05/2020   Procedure: POLYPECTOMY;  Surgeon: Irene Shipper, MD;  Location: [place];  Service: Endoscopy;;   PORTACATH PLACEMENT Right 10/01/2020   Procedure: INSERTION PORT-A-CATH;  Surgeon: [name], MD;   Location: [place];  Service: General;  Laterality: Right;   SUBMUCOSAL TATTOO INJECTION  09/05/2020   Procedure: SUBMUCOSAL TATTOO INJECTION;  Surgeon: Irene Shipper, MD;  Location[facility] ENDOSCOPY;  Service: Endoscopy;;   TRACHEOSTOMY TUBE PLACEMENT N/A 01/05/2021   Procedure: TRACHEOSTOMY;  Surgeon: [name], MD;  [facility] OR;  Service: ENT;  Laterality: N/A;   Family History  Problem Relation Age of Onset   Breast cancer Mother    Breast cancer Maternal Aunt    Prostate cancer Maternal Uncle    Social History:   reports that he has quit smoking. He has never used smokeless tobacco. He reports that he does not drink alcohol and does not use drugs.  Medications  Current Facility-Administered Medications:    0.9 %  sodium chloride infusion, , Intravenous, PRN, Renee Pain, MD, Stopped at 12/20/20 0955   acetaminophen (TYLENOL) 160 MG/5ML solution 650 mg, 650 mg, Oral, Q6H PRN, Vernelle Emerald, MD, 650 mg at 02/02/21 2033   albuterol (PROVENTIL) (2.5 MG/3ML) 0.083% nebulizer solution 2.5 mg, 2.5 mg, Nebulization, Q4H PRN, Spero Geralds, MD, 2.5 mg at 12/27/20 0559   anticoagulant sodium citrate solution 5 mL, 5 mL, Intracatheter, Q T,Th,Sa-HD, Olalere, Adewale A, MD, 5 mL at 02/03/21 1133   apixaban (ELIQUIS) tablet 5 mg, 5 mg, Per Tube, BID, Arrien, Jimmy Picket, MD, 5 mg at 02/04/21 2979   chlorhexidine gluconate (MEDLINE KIT) (PERIDEX) 0.12 % solution 15 mL, 15 mL, Mouth Rinse, BID, Arrien, Jimmy Picket, MD, 15 mL at 02/04/21 0817   Chlorhexidine Gluconate Cloth 2 % PADS 6 each, 6 each, Topical, Q0600, Edrick Oh, MD, 6 each at 02/04/21 0523   clonazePAM (KLONOPIN) disintegrating tablet 0.5 mg, 0.5 mg, Oral, TID PRN, Samella Parr, NP, 0.5 mg at 02/03/21 0805   dextrose 5 % solution, , Intravenous, Continuous, [name] L, NP   dextrose 50 % solution 0-50 mL, 0-50 mL, Intravenous, PRN, Renee Pain, MD, 50 mL at 01/01/21 1134   feeding supplement (NEPRO  CARB STEADY) liquid 1,000 mL, 1,000 mL, Per Tube, Q24H, Arrien, Jimmy Picket, MD, Held at 02/03/21 1947   feeding supplement (PROSource TF) liquid 45 mL, 45 mL, Per Tube, BID, Arrien, Jimmy Picket, MD, 45 mL at 02/03/21 0807   fentaNYL (SUBLIMAZE) injection 12.5 mcg, 12.5 mcg, Intravenous, Q6H PRN, Samella Parr, NP   fiber (NUTRISOURCE FIBER) 1 packet, 1 packet, Per Tube, BID, Arrien, Jimmy Picket, MD, 1 packet at 02/03/21 0807   glycopyrrolate (ROBINUL) injection 0.1 mg, 0.1 mg, Intravenous, Q4H PRN, Richarda Osmond, MD, 0.1 mg at 02/01/21 2044   hydrocerin (EUCERIN) cream, , Topical, BID, Ladell Pier, MD, Given at 02/04/21 0816   insulin aspart (novoLOG) injection 0-15 Units, 0-15 Units, Subcutaneous, Q4H, Bowser, Grace E, NP, 2 Units at 02/04/21 1200   insulin detemir (LEVEMIR) injection 10 Units, 10 Units,  Subcutaneous, Daily, Arrien, Jimmy Picket, MD, 10 Units at 02/04/21 [id number]   levETIRAcetam (KEPPRA) 100 MG/ML solution 500 mg, 500 mg, Per Tube, BID, Arrien, Jimmy Picket, MD, 500 mg at 02/04/21 9357   lip balm (CARMEX) ointment, , Topical, PRN, Alfredia Ferguson, Omair Latif, DO   MEDLINE mouth rinse, 15 mL, Mouth Rinse, 10 times per day, Noemi Chapel P, DO, 15 mL at 02/04/21 1200   metoprolol tartrate (LOPRESSOR) injection 5 mg, 5 mg, Intravenous, Q4H PRN, Samella Parr, NP   multivitamin (RENA-VIT) tablet 1 tablet, 1 tablet, Per Tube, QHS, Arrien, Jimmy Picket, MD, 1 tablet at 02/02/21 2100   nystatin (MYCOSTATIN) 100000 UNIT/ML suspension 500,000 Units, 5 mL, Mouth/Throat, QID, Arrien, Jimmy Picket, MD, 500,000 Units at 02/04/21 0177   promethazine (PHENERGAN) 6.25 MG/5ML syrup 12.5 mg, 12.5 mg, Oral, Q6H PRN, Samella Parr, NP   scopolamine (TRANSDERM-SCOP) 1 MG/3DAYS 1.5 mg, 1 patch, Transdermal, Q72H, Margaretha Seeds, MD, 1.5 mg at 02/01/21 1811   sodium chloride flush (NS) 0.9 % injection 10-40 mL, 10-40 mL, Intracatheter, PRN, Arrien, Jimmy Picket, MD,  10 mL at 01/31/21 0438  Exam: Current vital signs: BP (!) 152/74   Pulse 97   Temp 98.5 F (36.9 C) (Axillary)   Resp 20   Ht 5' 8"  (1.727 m)   Wt 98.5 kg   SpO2 98%   BMI 33.02 kg/m  Vital signs in last 24 hours: Temp:  [96.5 F (35.8 C)-98.5 F (36.9 C)] 98.5 F (36.9 C) (09/28 1150) Pulse Rate:  [97-101] 97 (09/28 1150) Resp:  [18-20] 20 (09/28 1150) BP: (131-156)/(74-94) 152/74 (09/28 1150) SpO2:  [98 %-99 %] 98 % (09/28 0353) FiO2 (%):  [28 %] 28 % (09/28 1150)  GENERAL: Awake, alert, laying in hospital bed on trach collar, in no acute distress. Psych: Patient often becomes tearful and appears anxious during examination, he remains cooperative throughout examination. Head: Normocephalic and atraumatic, without obvious abnormality EENT: Arcus senilis bilaterally, tracheostomy with trach collar in place, oral thrush is present. LUNGS: Tracheostomy midline neck, trach collar in place. Passy-Muir speech valve in place to assist with verbalization. CV: Irregular rate and rhythm on cardiac monitor. ABDOMEN: Soft, abdomen tender, ostomy in place. Extremities: warm, well perfused, without obvious deformity  NEURO:  Mental Status: Awake, alert, and oriented to person, place, time, and situation. He is able to provide a clear and coherent history of present illness. Speech/Language: speech is assisted via PMSV.  Patient sometimes uses insulin paper to communicate. Naming, repetition, fluency, and comprehension intact without aphasia. No neglect is noted. Cranial Nerves:  II: PERRL 5 mm/brisk. Visual fields full.  III, IV, VI: EOMI. Lid elevation symmetric and full.  V: Sensation is intact to light touch and symmetrical to face.  VII: Face is symmetric resting and smiling.  VIII: Hearing is intact to voice IX, X: Palate elevation is symmetric.  XI: Normal sternocleidomastoid and trapezius muscle strength XII: Tongue does not protrude, does not move left and right to  command. Motor: 5/5 upper extremity strength without pronator drift. Bilateral lower extremity weakness 4/5 hips,5/5 knee flexion extension Plantar/dorsiflexion right ankle 4-/5, left ankle 4+/5  Tone is normal. Bulk is normal.  Sensation: Intact to light touch bilaterally in all four extremities. No extinction to DSS present.  Coordination: FTN intact bilaterally.  DTRs: 1+ achilles, unable to elicit patellar reflexes. 2+ biceps and brachioradialis Plantar: Toes downgoing bilaterally Gait: Deferred  Labs I have reviewed labs in epic and the results pertinent to this  consultation are: CBC    Component Value Date/Time   WBC 18.7 (H) 02/03/2021 0647   RBC 2.97 (L) 02/03/2021 0647   HGB 8.5 (L) 02/03/2021 0647   HGB 11.2 (L) 11/19/2020 0914   HCT 25.4 (L) 02/03/2021 0647   PLT 381 02/03/2021 0647   PLT 212 11/19/2020 0914   MCV 85.5 02/03/2021 0647   MCH 28.6 02/03/2021 0647   MCHC 33.5 02/03/2021 0647   RDW 14.7 02/03/2021 0647   LYMPHSABS 4.1 (H) 01/30/2021 1937   MONOABS 2.0 (H) 01/30/2021 1937   EOSABS 0.3 01/30/2021 1937   BASOSABS 0.2 (H) 01/30/2021 1937   CMP     Component Value Date/Time   NA 135 02/04/2021 0400   K 3.6 02/04/2021 0400   CL 95 (L) 02/04/2021 0400   CO2 25 02/04/2021 0400   GLUCOSE 146 (H) 02/04/2021 0400   BUN 48 (H) 02/04/2021 0400   CREATININE 4.86 (H) 02/04/2021 0400   CREATININE 1.83 (H) 11/19/2020 0914   CALCIUM 9.3 02/04/2021 0400   PROT 7.9 01/29/2021 1233   ALBUMIN 3.0 (L) 02/04/2021 0400   AST 25 01/29/2021 1233   AST 12 (L) 11/19/2020 0914   ALT 31 01/29/2021 1233   ALT 19 11/19/2020 0914   ALKPHOS 116 01/29/2021 1233   BILITOT 0.6 01/29/2021 1233   BILITOT 0.7 11/19/2020 0914   GFRNONAA 13 (L) 02/04/2021 0400   GFRNONAA 41 (L) 11/19/2020 0914   GFRAA >60 04/01/2017 1921   Lipid Panel     Component Value Date/Time   TRIG 403 (H) 12/07/2020 0931   Imaging No recent head imaging: Reviewed CTH from 12/05/2020: 1. Negative  for bleed or other acute intracranial process. 2. Stable changes of left frontal craniotomy  Assessment: 65 year old male with prolonged hospitalization with concern for sepsis versus capacitabine toxicity with oral mucositis and hand/foot syndrome with ongoing tongue paresis affecting his ability to speak and swallow. - Examination reveals patient with tongue paresis, speech assisted via PMSV, and oral thrush.  Patient's soft palate elevates symmetrically.  Patient does have BLE weakness likely due to deconditioning from prolonged hospitalization. Patient states that he feels with improvement of oral thrush and removal of NG tube history mobility will improve and he will be able to feed himself and swallow appropriately. - SLP noted patient demonstrated lingual retraction with some lingual elevation. He was able to tolerate a thin liquid bolus without overt signs/symptoms of aspiration.  - Though possible, bilateral hypoglossal nerve palsies resulting in tongue paresis are rare in isolation. This could be the result of laryngoscopy with multiple intubations with possible hypoglossal nerve injury however, patient does not have complete paresis as he retains some lingual retraction and elevation on examination. Will obtain MRI brain to rule out pseudobulbar palsy in the setting of multiple infarctions.  Recommendations: - Continue with speech therapy as you are  - Continue treatment of oral thrush as you are  - MRI brain without contrast, if negative no further inpatient neurology recommendations - Can consider outpatient tongue EMG for further evaluation   Pt seen by NP/Neuro and later by MD. Note/plan to be edited by MD as needed.  Anibal Henderson, AGAC-NP [facility] Pager: [phone]  I have seen the patient reviewed the above note.  Possibilities include bilateral partial hypoglossal nerve palsies, though there is no evidence of fasciculation or atrophy that I would expect if  this was the case.  Bilateral lesions can result in a pseudobulbar palsy, which could present this way,  though this would be incomplete as well.  I think an MRI would be reasonable, but if this is negative I am not sure that further evaluation is likely to yield any further answers from a neurologic perspective, and I would treat with supportive care such as speech therapy, etc.  We will get an MRI, and we will follow this up but if this is negative, neurology will sign off with no further recommendations other than the ones listed above.  Roland Rack, MD [facility] [phone]  If 7pm- 7am, please page neurology on call as listed in [place].

## 2021-02-04 NOTE — Progress Notes (Signed)
Thomas [name] Progress Note   65 year old gentleman with a history of colon cancer status post resection and colostomy May 2022.  Status post 3 cycles of chemotherapy last was in July 2022.  Type 2 diabetes with neuropathy hypertension hyperlipidemia presented to the emergency room 11/30/2020 with abdominal pain.  Was found to be secondary to sepsis.  Patient also was found to have acute kidney injury.  Developed atrial fibrillation with rapid ventricular rate and was admitted to intensive care unit for septic shock.  On 12/03/2020 he developed metabolic encephalopathy with hypoxia and aspiration pneumonia and required intubation 12/06/2020.  He also required pressor support.  He was reintubated since 12/15/2020 tracheostomy placed.  His hospital course  complicated by DVT.  It appears that he has recurrent Pseudomonas pneumonia.  He also has recurrent anemia requiring transfusion of 4 units of packed red blood cells.  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 CRRT initiated 7/31 and transitioned to IHD on 12/19/20.  S/p LIJ TDC placement 12/22/20.  We will closely monitor for signs of recovery.  There does seem to be some improvement in urine output.    Dialysis with 1.5 L removed 01/31/2021, 923 ml removed on 9/27.  Will plan on dialysis Friday if needed and then giving the weekend off, then evaluating for any signs of recovery.     2.Urinary retention   Foley catheter placed     3.Volume overload/anasarca-improved with dialysis.   4.Sepsis/aspiration pneumonia/VDRF:aspiration pneumonia.  Tracheostomy placed   4.Stage IIIB colon cancer - sp partial colectomy, colostomy. On chemoRx sp 3 cycles.   5.Right leg DVT: On Eliquis.   6.Atrial fibrillation - on amiodarone and anticoagulation.     7.Anemia of critical illness: DC'ed  IV iron as concern for infection and on abx, transfuse as  needed, holding erythropoietin with active malignancy.  Monitor hemoglobin and transfuse as needed.   8. CKD-MBD; started sevelamer for hyperphosphatemia on 9/8, monitor phosphorus level.--improved 4.3-6.3   9. Disposition - poor overall prognosis.  Seen by palliative care team.  Discussion ongoing for possible transfer to [facility].  The tracheostomy should be capped before he can be accepted to [facility]  Subjective:   Feels not well, fatigued and tired; not tolerating TF even at 1/2 rate -> stopped evening of 9/27.    Objective:   BP (!) 155/94 (BP Location: Left Arm)   Pulse 98   Temp 97.9 F (36.6 C) (Axillary)   Resp 19   Ht 5' 8"  (1.727 m)   Wt 98.5 kg   SpO2 98%   BMI 33.02 kg/m   Intake/Output Summary (Last 24 hours) at 02/04/2021 0901 Last data filed at 02/04/2021 0000 Gross per 24 hour  Intake 371.08 ml  Output 923 ml  Net -551.92 ml   Weight change:   Physical Exam: General: Ill-looking male, with tracheostomy on trach collar, not in distress Heart:RRR, s1s2 nl Lungs: normal wob Abdomen:soft, Non-tender, non-distended Extremities: No edema. Neurology: Alert awake and following commands but more uncomfortable appearing. Dialysis Access: [facility] in place.     Imaging: DG Abd 1 View  Result Date: 02/03/2021 CLINICAL DATA:  Small-bowel obstruction EXAM: ABDOMEN - 1 VIEW COMPARISON:  None. FINDINGS: Nasoenteric feeding tube tip is seen within the gastric antrum. Normal abdominal gas pattern. No free intraperitoneal gas. IMPRESSION: Nasoenteric feeding tube tip within the gastric antrum. Electronically Signed   By: Cassandria Anger  Christa See M.D.   On: 02/03/2021 01:43    Labs: BMET Recent Labs  Lab 01/29/21 0751 01/29/21 1233 01/30/21 1959 01/31/21 0421 02/01/21 0410 02/02/21 0520 02/03/21 0647 02/04/21 0400  NA 133* 134* 134* 134* 134* 136 135 135  K 3.8 3.7 4.2 4.0 3.7 3.7 3.4* 3.6  CL 92* 95* 98 98 96* 97* 98 95*  CO2 26 25 19* 23 23 23  21* 25  GLUCOSE 161*  210* 127* 144* 195* 124* 141* 146*  BUN 77* 31* 54* 58* 39* 59* 79* 48*  CREATININE 4.01* 2.16* 4.44* 4.63* 4.05* 5.24* 6.07* 4.86*  CALCIUM 9.2 8.6* 9.4 9.2 9.3 9.6 9.4 9.3  PHOS 6.1*  --  3.0 5.8* 4.3 6.3* 6.1* 5.0*   CBC Recent Labs  Lab 01/29/21 1233 01/30/21 1937 01/31/21 0814 02/03/21 0647  WBC 18.0* 23.9* 19.1* 18.7*  NEUTROABS 14.4* 16.9*  --   --   HGB 9.2* 9.2* 8.5* 8.5*  HCT 27.3* 26.7* 25.5* 25.4*  MCV 86.4 84.5 86.1 85.5  PLT 344 381 324 381    Medications:     apixaban  5 mg Per Tube BID   chlorhexidine gluconate (MEDLINE KIT)  15 mL Mouth Rinse BID   Chlorhexidine Gluconate Cloth  6 each Topical Q0600   feeding supplement (NEPRO CARB STEADY)  1,000 mL Per Tube Q24H   feeding supplement (PROSource TF)  45 mL Per Tube BID   fiber  1 packet Per Tube BID   hydrocerin   Topical BID   insulin aspart  0-15 Units Subcutaneous Q4H   insulin detemir  10 Units Subcutaneous Daily   levETIRAcetam  500 mg Per Tube BID   LORazepam  0.5 mg Per Tube BID   mouth rinse  15 mL Mouth Rinse 10 times per day   multivitamin  1 tablet Per Tube QHS   nystatin  5 mL Mouth/Throat QID   scopolamine  1 patch Transdermal Q72H      Otelia Santee, MD 02/04/2021, 9:01 AM

## 2021-02-04 NOTE — Progress Notes (Addendum)
Pharmacy - > Eliquis to Lovenox  Eliquis for DVT stopped for GI obstructive type symptoms Last dose of Eliquis this morning.  Plan:  Will begin Lovenox 100 mg sq Q 24 hours at 8 pm when next dose of Eliquis dose Continue to follow  Thank you Anette Guarneri, PharmD  9/29 AM: decreasing Lovenox to 90 mg Q 24 hours, will plan to check Lovenox level after 3 to 4 doses to ensure therapeutic.  Thank you Anette Guarneri, PharmD

## 2021-02-04 NOTE — Progress Notes (Incomplete)
NP notified of increasing drowsiness as well as tenderness to abdomen, decreased output and emesis. Orders received.

## 2021-02-04 NOTE — Progress Notes (Signed)
Physical Therapy Treatment Patient Details Name: Thomas [name] MRN: [id number] DOB: May 01, 1956 Today's Date: 02/04/2021   History of Present Illness 65 y.o. male admitted 7/24 with high ostomy output. Pt with severe sepsis and multisystem organ failure. Hospital admission complicated by volume overload, aspiration pneumonia, acute respiratory failure, AKI and ischemic colitis. Intubated 7/30. S/p trach placement 8/11. CorTrak 8/12. HD initiated 8/12. To IR 8/15 for tunneled HD cath. Pulled out trach x2 8/22 replaced by PCCM. Pulled out trach again 8/25 but unable to be reinserted with pt intubated. 8/29 to OR for stoma clean up and trach replacement.Trach downsized 9/10.  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    PT Comments    Pt is seen for mobility attempt but was too painful in abdomen as well as tired from poor sleep to get up to side of bed.  Pt is assisted to do exercises to BLE's instead, and will encourage him to work tomorrow if possible to sit up and encourage gut motility with activity.  Follow along as tolerated and encourage sitting upright.   Recommendations for follow up therapy are one component of a multi-disciplinary discharge planning process, led by the attending physician.  Recommendations may be updated based on patient status, additional functional criteria and insurance authorization.  Follow Up Recommendations  SNF     Equipment Recommendations  Wheelchair (measurements PT);Wheelchair cushion (measurements PT);3in1 (PT);Rolling walker with 5" wheels    Recommendations for Other Services       Precautions / Restrictions Precautions Precautions: Fall Precaution Comments: cortrak, R subclavian tunneled HD cath, ostomy, O2 via trach  collar Restrictions Weight Bearing Restrictions: No     Mobility  Bed Mobility Overal bed mobility: Needs Assistance Bed Mobility: Rolling Rolling: Min assist              Transfers                 General transfer comment: deferred  Ambulation/Gait                 Stairs             Wheelchair Mobility    Modified Rankin (Stroke Patients Only)       Balance                                            Cognition Arousal/Alertness: Awake/alert Behavior During Therapy: Flat affect Overall Cognitive Status: Impaired/Different from baseline Area of Impairment: Safety/judgement;Awareness;Problem solving;Following commands;Attention                   Current Attention Level: Selective   Following Commands: Follows one step commands inconsistently;Follows one step commands with increased time Safety/Judgement: Decreased awareness of safety;Decreased awareness of deficits Awareness: Intellectual Problem Solving: Slow processing;Requires verbal cues;Requires tactile cues General Comments: poor sleep and vomiting      Exercises General Exercises - Lower Extremity Ankle Circles/Pumps: AROM;AAROM;5 reps Quad Sets: AROM;10 reps Heel Slides: AAROM;10 reps Hip ABduction/ADduction: AAROM;10 reps Straight Leg Raises: AAROM;10 reps Hip Flexion/Marching: AAROM;10 reps    General Comments General comments (skin integrity, edema, etc.): pt is sitting on center of bed and cannot tolerate more upright sitting due to pain and lack  of motility in gut      Pertinent Vitals/Pain Pain Assessment: Faces Faces Pain Scale: Hurts little more Pain Location: abdomen Pain Descriptors / Indicators: Guarding Pain Intervention(s): Limited activity within patient's tolerance;Monitored during session;Repositioned    Home Living                      Prior Function            PT Goals (current goals can now be found in the care  plan section) Acute Rehab PT Goals Patient Stated Goal: get home Progress towards PT goals: Not progressing toward goals - comment    Frequency    Min 3X/week      PT Plan Current plan remains appropriate    Co-evaluation              AM-PAC PT "6 Clicks" Mobility   Outcome Measure  Help needed turning from your back to your side while in a flat bed without using bedrails?: A Little Help needed moving from lying on your back to sitting on the side of a flat bed without using bedrails?: A Little Help needed moving to and from a bed to a chair (including a wheelchair)?: Total Help needed standing up from a chair using your arms (e.g., wheelchair or bedside chair)?: Total Help needed to walk in hospital room?: Total Help needed climbing 3-5 steps with a railing? : Total 6 Click Score: 10    End of Session Equipment Utilized During Treatment: Oxygen Activity Tolerance: Patient tolerated treatment well Patient left: with call bell/phone within reach;with bed alarm set Nurse Communication: Mobility status PT Visit Diagnosis: Muscle weakness (generalized) (M62.81);Other abnormalities of gait and mobility (R26.89)     Time: 9326-7124 PT Time Calculation (min) (ACUTE ONLY): 31 min  Charges:  $Therapeutic Exercise: 8-22 mins $Therapeutic Activity: 8-22 mins                    Ramond Dial 02/04/2021, 7:30 PM  Mee Hives, PT MS Acute Rehab Dept. Number: [place] [facility] [place]

## 2021-02-04 NOTE — Progress Notes (Signed)
Progress Note  30 Days Post-Op  Subjective: Patient is well known to surgical service. He had undergone an exploratory laparotomy with transverse colectomy and colostomy on 09/10/2020 for the near obstructing colon cancer. Patient was admitted to [facility] 7/24 with abdominal pain, high output from stoma and ARF and shock. No surgical issues from an abdominal standpoint and output was able to be controlled with anti-motility agents. Patient was able to come off these and colostomy output improved. Patient required intubation secondary to aspiration PNA and underwent tracheostomy 8/11 and then was revised by ENT 8/29. He has required HD and was transferred to [facility]. Hospitalization has also been complicated by RLE DVT now on eliquis, colitis, pseudomonas PNA, encephalopathy. He has had some intolerance of tube feeds over the last few days and acute on chronic abdominal pain. CT 9/23 without bowel obstruction or acute abnormality. Symptoms initially improved after CT but then over the last 2 days he has had less output from colostomy.   His wife was at bedside as well.   Objective: Vital signs in last 24 hours: Temp:  [96.5 F (35.8 C)-98.5 F (36.9 C)] 98.5 F (36.9 C) (09/28 1150) Pulse Rate:  [97-101] 97 (09/28 1150) Resp:  [18-20] 20 (09/28 1150) BP: (131-156)/(74-94) 152/74 (09/28 1150) SpO2:  [98 %-99 %] 98 % (09/28 0353) FiO2 (%):  [28 %] 28 % (09/28 1150) Last BM Date: 02/02/21  Intake/Output from previous day: 09/27 0701 - 09/28 0700 In: 431.1 [I.V.:20; NG/GT:411.1] Out: 923  Intake/Output this shift: No intake/output data recorded.  PE: General: WD, chronically ill appearing malewho is laying in bed in NAD HEENT: trach present with PMV in place Heart: regular, rate, and rhythm.   Lungs: junky sounding cough Abd: soft, mild diffuse tenderness that overall is not worse with palpation, no peritonitis or guarding, ND, +BS, stoma pink and viable with some liquid stool in ostomy pouch,  stoma digitized and widely patent and not strictured at the level of the fascia     Lab Results:  Recent Labs    02/03/21 0647  WBC 18.7*  HGB 8.5*  HCT 25.4*  PLT 381   BMET Recent Labs    02/03/21 0647 02/04/21 0400  NA 135 135  K 3.4* 3.6  CL 98 95*  CO2 21* 25  GLUCOSE 141* 146*  BUN 79* 48*  CREATININE 6.07* 4.86*  CALCIUM 9.4 9.3   PT/INR No results for input(s): LABPROT, INR in the last 72 hours. CMP     Component Value Date/Time   NA 135 02/04/2021 0400   K 3.6 02/04/2021 0400   CL 95 (L) 02/04/2021 0400   CO2 25 02/04/2021 0400   GLUCOSE 146 (H) 02/04/2021 0400   BUN 48 (H) 02/04/2021 0400   CREATININE 4.86 (H) 02/04/2021 0400   CREATININE 1.83 (H) 11/19/2020 0914   CALCIUM 9.3 02/04/2021 0400   PROT 7.9 01/29/2021 1233   ALBUMIN 3.0 (L) 02/04/2021 0400   AST 25 01/29/2021 1233   AST 12 (L) 11/19/2020 0914   ALT 31 01/29/2021 1233   ALT 19 11/19/2020 0914   ALKPHOS 116 01/29/2021 1233   BILITOT 0.6 01/29/2021 1233   BILITOT 0.7 11/19/2020 0914   GFRNONAA 13 (L) 02/04/2021 0400   GFRNONAA 41 (L) 11/19/2020 0914   GFRAA >60 04/01/2017 1921   Lipase     Component Value Date/Time   LIPASE 24 12/01/2020 0018       Studies/Results: DG Abd 1 View  Result Date: 02/03/2021 CLINICAL  DATA:  Small-bowel obstruction EXAM: ABDOMEN - 1 VIEW COMPARISON:  None. FINDINGS: Nasoenteric feeding tube tip is seen within the gastric antrum. Normal abdominal gas pattern. No free intraperitoneal gas. IMPRESSION: Nasoenteric feeding tube tip within the gastric antrum. Electronically Signed   By: Fidela Salisbury M.D.   On: 02/03/2021 01:43   DG Abd 2 Views  Result Date: 02/04/2021 CLINICAL DATA:  Left lower quadrant abdominal pain. EXAM: ABDOMEN - 2 VIEW COMPARISON:  CT scan 01/30/2021 and abdominal radiograph from yesterday. FINDINGS: Stable mild eventration of the right hemidiaphragm with some overlying atelectasis. The feeding tube tip is in the antropyloric  region of the stomach. Stable air-filled small bowel loops in the right abdomen but no significant distension. No free air. Right lower quadrant ostomy noted. IMPRESSION: Feeding tube tip is in the antropyloric region of the stomach. No significant bowel distention or free air. Electronically Signed   By: Marijo Sanes M.D.   On: 02/04/2021 12:59    Anti-infectives: Anti-infectives (From admission, onward)    Start     Dose/Rate Route Frequency Ordered Stop   01/29/21 1500  fluconazole (DIFLUCAN) IVPB 100 mg        100 mg 50 mL/hr over 60 Minutes Intravenous Every 24 hours 01/29/21 1316 02/03/21 0951   01/26/21 1630  Ampicillin-Sulbactam (UNASYN) 3 g in sodium chloride 0.9 % 100 mL IVPB  Status:  Discontinued        3 g 200 mL/hr over 30 Minutes Intravenous Every 12 hours 01/26/21 1534 01/29/21 1416   01/19/21 1600  cefTAZidime (FORTAZ) 1 g in sodium chloride 0.9 % 100 mL IVPB        1 g 200 mL/hr over 30 Minutes Intravenous  Once 01/19/21 1357 01/20/21 1126   01/17/21 1000  fluconazole (DIFLUCAN) tablet 50 mg       See Hyperspace for full Linked Orders Report.   50 mg Per Tube Daily 01/16/21 1752 01/22/21 1202   01/16/21 1845  fluconazole (DIFLUCAN) tablet 100 mg       See Hyperspace for full Linked Orders Report.   100 mg Per Tube  Once 01/16/21 1752 01/16/21 1816   01/15/21 1215  cefTAZidime (FORTAZ) 2 g in sodium chloride 0.9 % 100 mL IVPB  Status:  Discontinued        2 g 200 mL/hr over 30 Minutes Intravenous Every T-Th-Sa (Hemodialysis) 01/15/21 1207 01/25/21 1231   01/14/21 1130  cefTRIAXone (ROCEPHIN) 1 g in sodium chloride 0.9 % 100 mL IVPB  Status:  Discontinued        1 g 200 mL/hr over 30 Minutes Intravenous Every 24 hours 01/14/21 1030 01/15/21 1052   12/24/20 1730  ceFEPIme (MAXIPIME) 1 g in sodium chloride 0.9 % 100 mL IVPB        1 g 200 mL/hr over 30 Minutes Intravenous Every 24 hours 12/24/20 1642 12/29/20 0901   12/23/20 0845  cefTRIAXone (ROCEPHIN) 2 g in sodium  chloride 0.9 % 100 mL IVPB  Status:  Discontinued        2 g 200 mL/hr over 30 Minutes Intravenous Every 24 hours 12/23/20 0800 12/24/20 1620   12/22/20 0600  ceFAZolin (ANCEF) IVPB 2g/100 mL premix        2 g 200 mL/hr over 30 Minutes Intravenous To Radiology 12/21/20 1503 12/22/20 1149   12/09/20 1800  vancomycin (VANCOREADY) IVPB 1750 mg/350 mL  Status:  Discontinued        1,750 mg 175 mL/hr over 120 Minutes Intravenous  Every 24 hours 12/08/20 1357 12/09/20 1403   12/09/20 1700  anidulafungin (ERAXIS) 100 mg in sodium chloride 0.9 % 100 mL IVPB  Status:  Discontinued        100 mg 78 mL/hr over 100 Minutes Intravenous Every 24 hours 12/08/20 1359 12/09/20 1321   12/08/20 1500  vancomycin (VANCOCIN) 2,500 mg in sodium chloride 0.9 % 500 mL IVPB        2,500 mg 250 mL/hr over 120 Minutes Intravenous  Once 12/08/20 1357 12/08/20 1719   12/08/20 1500  anidulafungin (ERAXIS) 200 mg in sodium chloride 0.9 % 200 mL IVPB        200 mg 78 mL/hr over 200 Minutes Intravenous  Once 12/08/20 1359 12/08/20 1821   12/08/20 0000  ceFEPIme (MAXIPIME) 2 g in sodium chloride 0.9 % 100 mL IVPB  Status:  Discontinued        2 g 200 mL/hr over 30 Minutes Intravenous Every 12 hours 12/07/20 1200 12/14/20 0858   12/07/20 1000  metroNIDAZOLE (FLAGYL) IVPB 500 mg  Status:  Discontinued        500 mg 100 mL/hr over 60 Minutes Intravenous Every 12 hours 12/07/20 0849 12/14/20 0858   12/07/20 0900  ceFEPIme (MAXIPIME) 2 g in sodium chloride 0.9 % 100 mL IVPB  Status:  Discontinued        2 g 200 mL/hr over 30 Minutes Intravenous Every 24 hours 12/07/20 0809 12/07/20 1200   12/07/20 0830  metroNIDAZOLE (FLAGYL) IVPB 500 mg  Status:  Discontinued        500 mg 100 mL/hr over 60 Minutes Intravenous Every 8 hours 12/07/20 0758 12/07/20 0849   12/06/20 0900  Ampicillin-Sulbactam (UNASYN) 3 g in sodium chloride 0.9 % 100 mL IVPB  Status:  Discontinued        3 g 200 mL/hr over 30 Minutes Intravenous Every 12 hours  12/06/20 0832 12/07/20 0757   12/03/20 0800  ceFEPIme (MAXIPIME) 2 g in sodium chloride 0.9 % 100 mL IVPB  Status:  Discontinued        2 g 200 mL/hr over 30 Minutes Intravenous Every 8 hours 12/03/20 0746 12/04/20 1004   12/01/20 1000  ceFEPIme (MAXIPIME) 2 g in sodium chloride 0.9 % 100 mL IVPB  Status:  Discontinued        2 g 200 mL/hr over 30 Minutes Intravenous Every 12 hours 12/01/20 0513 12/03/20 0746   12/01/20 0300  vancomycin (VANCOREADY) IVPB 2000 mg/400 mL        2,000 mg 200 mL/hr over 120 Minutes Intravenous  Once 12/01/20 0255 12/01/20 0528   11/30/20 2245  ceFEPIme (MAXIPIME) 2 g in sodium chloride 0.9 % 100 mL IVPB        2 g 200 mL/hr over 30 Minutes Intravenous STAT 11/30/20 2237 12/01/20 0003        Assessment/Plan Stage IIIb colon cancer S/p open partial colectomy with transverse colostomy 09/10/20 Dr. Bobbye Morton Abdominal pain and decreased colostomy output Nausea and vomiting - POD 147 - new CT 9/23 without acute abnormality and KUB without significant findings - stoma without stricture and viable - this seems like more of a functional motility issue  - recommend optimizing electrolytes for bowel function - goal K >4.0 and Mg >2.0  - encourage mobilization as able - add miralax for bowel regimen - monitor output and repeat film in AM - if continuing to have emesis would recommend replacing cortrak with NGT for decompression - if NGT placed would likely  plan either repeat CT or SBO protocol to rule out proximal obstruction    FEN: NPO, Cortrak for TFs VTE: eliquis, SCDs ID: completed abx today    - below per primary team - Sepsis vs reaction to chemotherapy with shock - resolved Acute on chronic hypoxic respiratory failure/fungal PNA - on trach collar Fungal UTI AKI on CKD stage II - on HD A fib with RVR - on amio gtt Hx of MCA CVA Hx of asthma Hx of epilepsy on Keppra T2DM GERD HTN HLD OSA Obesity class II - BMI 39.76  LOS: 65 days    Norm Parcel, [facility] Surgery 02/04/2021, 1:02 PM Please see Amion for pager number during day hours 7:00am-4:30pm

## 2021-02-04 NOTE — Progress Notes (Signed)
Checked patients trach removed pmv valve for night rest.  Patient keeps having episodes of throwing up bloody brown sputum.  RN aware and calling MD.

## 2021-02-04 NOTE — Progress Notes (Addendum)
[facility] PROGRESS NOTE  Thomas [name] TXM:[id number] DOB: 1955/11/06 DOA: 11/30/2020 PCP: [name], NP  Status: Remains inpatient appropriate because:Persistent severe electrolyte disturbances, Unsafe d/c plan, and Inpatient level of care appropriate due to severity of illness  Dispo: The patient is from: Home              Anticipated d/c is to: SNF versus LTAC vs CIR              Patient currently is not medically stable to d/c.   Difficult to place patient Yes               Barriers to discharge: Currently has tracheostomy in place and is requiring dialysis but remains too weak to tolerate dialysis in chair which is a requirement for outpatient HD    Level of care: Progressive  Code Status: Full Family Communication: Attending physician spoke with wife at bedside 9/26 DVT prophylaxis: Eliquis COVID vaccination status: Moderna 07/08/2019 and 08/11/2019 with first booster dose given 08/05/2020   HPI:  65 y.o. male with a history of T2DM with neuropathy, HTN, HLD., colon cancer s/p resection and colostomy May 2022 s/p 3 cycles chemotherapy (last was July 2022). Followed by Dr. Benay Spice. Started CAPOX June 2022, reduced cepecitabine dose starting 11/19/2020 due to hand/foot syndrome, reduced oxaliplatin due to renal impairment and poor performance status. Presented to the ED 7/24 with abdominal pain found to be febrile, tachycardic, tachypneic. Lactic acid was 6.6 and persisted at 6.8 after sepsis IV fluid bolus and cefepime. He had ARF, demand ischemia, bandemia, was hyperglycemic with negative ketones, with lactic acidosis. He developed AFib with RVR in the ED, was started on diltiazem, and was admitted to the ICU for septic shock with unclear source. Sepsis was subsequently felt to be ruled out at time of admission, presentation felt more likely to be related to cepacitabine toxicity with oral mucositis, hand/foot syndrome, and diarrhea/high output colostomy.   He was transferred  to hospitalist service 7/27 but developed metabolic encephalopathy with hyoxia due to aspiration pneumonia on 7/30 requiring intubation and pressor support. Failed extubation with inability to clear secretions, requiring reintubation 8/8, and tracheostomy subsequently placed 8/11. This was pulled out 8/22, replaced under bronch guidance, again removed 8/25 requiring reintubation after failed reinsertion. ENT revised tracheostomy in the OR 8/29. He has required IHD for ARF, and also had complications of RLE DVT now on eliquis, colitis thought to be ischemic 8/2 not requiring specific intervention, Pseudomonas pneumonia having completed treatment, agitated encephalopathy which has improved, anemia requiring 4u PRBCs total, and thrombocytopenia which has resolved. On 9/4 he was transferred to hospitalist service having tolerated trach collar.  Patient was also evaluated by Dr. Benay Spice during this admission clarified that patient was receiving curative chemotherapy prior to admission  Subjective: Having significant pericolostomy and upper left-sided abdominal pain associated with nausea and vomiting.  Tube feedings have been turned off by staff.  Bowel sounds are hypoactive.  Reports everything makes him sick on his stomach regarding his medications.  He is very frustrated and began crying.  Objective: Vitals:   02/03/21 2344 02/04/21 0353  BP: (!) 144/86 (!) 155/94  Pulse: (!) 101 99  Resp: 20 19  Temp: 98 F (36.7 C) 97.9 F (36.6 C)  SpO2: 98% 98%    Intake/Output Summary (Last 24 hours) at 02/04/2021 0727 Last data filed at 02/04/2021 0000 Gross per 24 hour  Intake 431.08 ml  Output 923 ml  Net -491.92 ml  Filed Weights   02/02/21 0500 02/03/21 0905 02/03/21 1125  Weight: 100.6 kg 99.5 kg 98.5 kg    Exam: Constitutional: NAD, calm, uncomfortable secondary to abdominal pain and recurrent nausea and vomiting Respiratory: #6 XLT uncuffed trach 9/9; 28% trach collar, lung sounds remain  clear to auscultation without any increased work of breathing, normal pulse oximetry. Cardiovascular: S1-S2, Lotensin, no peripheral edema, regular pulse Abdomen: Abdomen is soft and colostomy site as well as left upper abdomen without guarding or rebound, hypoactive bowel sounds bowel sounds.  No flatus in colostomy bag and only a tiny amount of thick liquid stool noted.  Significant decrease in ostomy output over the past 18 hours. Genitourinary: Foley catheter in place with mildly dark yellow urine noted in drainage bag Neurologic: CN 2-12 grossly intact. Sensation intact, Strength 3/5 x all 4 extremities.  Psychiatric: Normal judgment and insight. Alert and oriented x 3.    Assessment/Plan: Acute problems:  Acute hypoxic respiratory failure s/p tracheostomy due to aspiration pneumonia  Recurrent pansensitive Pseudomonas tracheobronchitis -Tolerating PMV.  Trach team wants to make sure patient more mobile with stronger vocalization before trying capping trials. -Continue Robinul  -SLP following for PMV training.  Recurrent abdominal pain -Last week patient had clinical signs of partial small bowel obstruction with associated abdominal pain.  He had a CT scan that showed no evidence of ileus or bowel obstruction-suspect contrast given opened up the partial obstruction -For the past 2 days he had significant amount of flatus in the colostomy bag but over the past 18 hours has had decreased colostomy output as well as significant pain around the ostomy site as well as in the left upper abdomen.  He is also having recurrent nausea and vomiting and tube feedings have been turned off -Two-view abdominal films obtained.  I have reviewed these films.  There are no air-fluid levels but there appears to be a loop of dilated bowel proximal to the ostomy.  Surgery consulted. -Given associated nausea and vomiting have discontinued scheduled Ativan, have changed Zofran to Phenergan since he states Zofran is  ineffective, have added low-dose IV fentanyl every 6 hours as needed for pain -We will change Eliquis to full dose Lovenox-pharmacy to assist with dosing given low GFR  @@1300  went back to speak to patient's wife and update her on current status as well as examine patient.  He reports his pain is somewhat better and there is noted to be a small amount of flatus but no stool in the ostomy bag.  Funguria -Continue IV Diflucan -LD due 9/27  Thrush -Continue nystatin -It is suspected this is contributing to patient's difficulty swallowing/tongue motility -This is recurrent-he completed a course of Diflucan x6 doses  Inability to elevate tongue -Patient had been noted to have difficulty with phonation as well as continued issues with dysphagia despite being very alert -SLP evaluated and noted patient is unable to elevate tongue properly bilaterally -Neurology has been consulted  Urinary retention -Has completed Diflucan as of 9/28 -Given ongoing issues with abdominal pain and possible obstruction physiology will not discontinue Foley at this time   Acute renal failure on stage IIIb CKD: Presumed ischemic ATN -started CRRT 7/31 > iHD 8/12 s/p left IJ [facility] 8/15.   - Continue HD TTS thru [facility] [place] per nephrology.  Patient needs to tolerate sitting up in chair for dialysis treatments -Initially not expected to recover renal function -nephrology now documenting that he may be headed towards renal recovery.  High output ostomy, diarrhea:  -Infectious work-up negative -Recent PSBO likely caused by Imodium and discontinued -Today having obstructive type symptoms with no output x18 hours and PERI ostomy abdominal pain   Anemia of critical illness, acute blood loss as well as anemia of chronic disease and iron deficiency:  - s/p 4u PRBCs this hospitalization  - IV iron TTS x8 doses per nephrology. -Current hemoglobin stable at 10.6 with normal platelets   Stage IIIb colon CA s/p partial  transverse colectomy with end colostomy May 2022:  - Palliative care has been involved during this hospitalization- goals remain clear: full aggressive measures desired.  Documented patient was on curative chemotherapy prior to admission   Moderate protein calorie malnutrition/dysphagia - Continue tube feeds via cortrack-SLP following.  Patient reports difficulty swallowing secondary to trach. 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   Uncontrolled T2DM on OHAs prior to admission with associated neuropathy:  HbA1c 8.7% at admission. - Continue gabapentin, levemir and SSI    HTN:  - Continue norvasc   HLD:  - Continue atorvastatin   Thrombocytopenia:  -Transient likely due to critical illness. HIT Ab negative on 8/5.    ?Sepsis - initially thought to be present on admission- has been ruled out.     Data Reviewed: Basic Metabolic Panel: Recent Labs  Lab 01/31/21 0421 02/01/21 0410 02/02/21 0520 02/03/21 0647 02/04/21 0400  NA 134* 134* 136 135 135  K 4.0 3.7 3.7 3.4* 3.6  CL 98 96* 97* 98 95*  CO2 23 23 23  21* 25  GLUCOSE 144* 195* 124* 141* 146*  BUN 58* 39* 59* 79* 48*  CREATININE 4.63* 4.05* 5.24* 6.07* 4.86*  CALCIUM 9.2 9.3 9.6 9.4 9.3  PHOS 5.8* 4.3 6.3* 6.1* 5.0*   Liver Function Tests: Recent Labs  Lab 01/29/21 1233 01/30/21 1959 01/31/21 0421 02/01/21 0410 02/02/21 0520  02/03/21 0647 02/04/21 0400  AST 25  --   --   --   --   --   --   ALT 31  --   --   --   --   --   --   ALKPHOS 116  --   --   --   --   --   --   BILITOT 0.6  --   --   --   --   --   --   PROT 7.9  --   --   --   --   --   --   ALBUMIN 2.7*   < > 2.6* 2.7* 2.6* 2.8* 3.0*   < > = values in this interval not displayed.    CBC: Recent Labs  Lab 01/29/21 0751 01/29/21 1233 01/30/21 1937 01/31/21 0814 02/03/21 0647  WBC 14.1* 18.0* 23.9* 19.1* 18.7*  NEUTROABS  --  14.4* 16.9*  --   --   HGB 8.3* 9.2* 9.2* 8.5* 8.5*  HCT 24.4* 27.3* 26.7* 25.5* 25.4*  MCV  85.6 86.4 84.5 86.1 85.5  PLT 309 344 381 324 381    CBG: Recent Labs  Lab 02/03/21 0641 02/03/21 0726 02/03/21 1157 02/03/21 1611 02/03/21 2004  GLUCAP 145* 140* 136* 187* 135*    Scheduled Meds:  apixaban  5 mg Per Tube BID   chlorhexidine gluconate (MEDLINE KIT)  15 mL Mouth Rinse BID   Chlorhexidine Gluconate Cloth  6 each Topical Q0600   feeding supplement (NEPRO CARB STEADY)  1,000 mL Per Tube Q24H   feeding supplement (PROSource TF)  45 mL Per Tube BID   fiber  1 packet Per Tube BID   hydrocerin   Topical BID   insulin aspart  0-15 Units Subcutaneous Q4H   insulin detemir  10 Units Subcutaneous Daily   levETIRAcetam  500 mg Per Tube BID   LORazepam  0.5 mg Per Tube BID   mouth rinse  15 mL Mouth Rinse 10 times per day   multivitamin  1 tablet Per Tube QHS   nystatin  5 mL Mouth/Throat QID   scopolamine  1 patch Transdermal Q72H   Continuous Infusions:  sodium chloride Stopped (12/20/20 0955)   anticoagulant sodium citrate      Principal Problem:   Diabetes mellitus with hyperosmolarity without hyperglycemic hyperosmolar nonketotic coma (HCC) Active Problems:   Lactic acidosis   Acute renal failure superimposed on stage 2 chronic kidney disease (HCC)   Metabolic acidosis   Tachycardia   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 (HCC)   Adverse effect of chemotherapy    Pressure injury of skin   Atrial fibrillation with RVR (HCC)   Sepsis (HCC)   Malnutrition of moderate degree   Acute metabolic encephalopathy   Acute respiratory failure with hypoxemia (HCC)   Acute renal failure with acute cortical necrosis (HCC)   Shock ([place])   Status post tracheostomy ([place])   Endotracheal tube present   Hemodialysis status ([place])   Palliative care by specialist   SOB (shortness of breath)   Consultants: PCCM General surgery Nephrology Hematology/oncology Palliative care medicine team  Procedures: 2u PRBC 7/31, 1u PRBC 8/17, 1u PRBC 8/27 Tracheostomy revision 01/05/2021 Dr. Constance Holster [facility] 8/22    Antibiotics: Vancomycin 7/24 Cefepime 7/24 - 7/27 Unasyn 7/30 >> cefepime/flagyl 7/31 - 8/6     Time spent: 40 minutes    [name] ANP  [facility] 7 am - 330 pm/M-F for direct patient care and secure chat Please refer to Amion for contact info 65  days

## 2021-02-04 NOTE — Progress Notes (Signed)
Speech Language Pathology Treatment: Dysphagia  Patient Details Name: Thomas [name] MRN: [id number] DOB: 1955/06/26 Today's Date: 02/04/2021 Time: 4818-5631 SLP Time Calculation (min) (ACUTE ONLY): 34 min  Assessment / Plan / Recommendation Clinical Impression  Pt was seen for dysphagia treatment. He was alert and cooperative and secretions were noted around trach upon SLP's entry. Secretions removed by SLP and PMSV was donned. Pt tolerated PMSV with stable vitals throughout the session. Oral mucosa was improved compared to when this SLP last saw him on 9/26. Pt's lingual surface was also improved, but coating still noted. SLP was able to remove more of the gelatinous material on the posterior lingual surface with tweezers. Pt demonstrated lingual retraction with prompted overarticulation of of /k/ and /g/ and some lingual elevation was noted with produced of /t/. Pt tolerated ice chips and a bolus of thin liquids via tsp without overt s/sx of aspiration. Posterior head tilt was reduced during this session, but pt was reclined so this likely facilitated some posterior transport. Repeated attempts were needed, but pt reported some improvement in bolus manipulation. Pt requested that the session be terminated and additional boluses be deferred since he would like to rest. SLP will continue to follow pt.    HPI HPI: Pt is a 65 y/o male admitted on 7/25 for worsening sepsis vs reaction to chemotherapy with high ostomy output and soft BP. Pt developed worsening hypoxia and was requiring BIPAP w/ FIO2 of 100% and increased work of breathing. SLP consulted due to concern for aspiration. BSE 7/28 recommended NPO status and MBS, but this could not completed subsequently due to lethargy. CXR in AM of 7/30 showed some worsening concern of aspiration. ETT 7/30-8/8; tolerated nasal cannula for about an hour; reintubated 8/8-trach 8/11. Pt pulled out trach x2 8/22; replaced by PCCM. Bronch on 8/22 revealed endotracheal  granulation tissue at trach site. Trach removed again on 8/25; unable to reinsert despite multiple attenpts; pt reintubated 8/25- trach by ENT on 8/29. BSE 8/22 limited to ice chips due to pt's refusal of other consistencies; rx NPO status with Cortrak. 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      SLP Plan  Continue with current plan of care      Recommendations for follow up therapy are one component of a multi-disciplinary discharge planning process, led by the attending physician.  Recommendations may be updated based on patient status, additional functional criteria and insurance authorization.    Recommendations  Diet recommendations: NPO (ice chips allowed) Liquids provided via: Teaspoon Medication Administration: Via alternative means      Patient may use Passy-Muir Speech Valve: During all waking hours (remove during sleep) PMSV Supervision: Intermittent         Oral Care Recommendations: Oral care QID;Patient independent with oral care (encourage pt to complete own oral care) Follow up Recommendations: Skilled Nursing facility SLP Visit Diagnosis: Aphonia (R49.1);Dysphagia, unspecified (R13.10) Plan: Continue with current plan of care       Edna Grover I. Hardin Negus, [place], [place] Office number [phone] Pager [place]  02/04/2021, 11:16 AM

## 2021-02-05 ENCOUNTER — Inpatient Hospital Stay (HOSPITAL_COMMUNITY): Payer: Medicare HMO

## 2021-02-05 DIAGNOSIS — R112 Nausea with vomiting, unspecified: Secondary | ICD-10-CM | POA: Diagnosis not present

## 2021-02-05 DIAGNOSIS — E1100 Type 2 diabetes mellitus with hyperosmolarity without nonketotic hyperglycemic-hyperosmolar coma (NKHHC): Secondary | ICD-10-CM | POA: Diagnosis not present

## 2021-02-05 DIAGNOSIS — R1013 Epigastric pain: Secondary | ICD-10-CM

## 2021-02-05 DIAGNOSIS — R1110 Vomiting, unspecified: Secondary | ICD-10-CM

## 2021-02-05 LAB — RENAL FUNCTION PANEL
Albumin: 3.1 g/dL — ABNORMAL LOW (ref 3.5–5.0)
Albumin: 2.7 g/dL — ABNORMAL LOW (ref 3.5–5.0)
Anion gap: 16 — ABNORMAL HIGH (ref 5–15)
Anion gap: 14 (ref 5–15)
BUN: 62 mg/dL — ABNORMAL HIGH (ref 8–23)
BUN: 72 mg/dL — ABNORMAL HIGH (ref 8–23)
CO2: 21 mmol/L — ABNORMAL LOW (ref 22–32)
CO2: 19 mmol/L — ABNORMAL LOW (ref 22–32)
Calcium: 9.5 mg/dL (ref 8.9–10.3)
Calcium: 9 mg/dL (ref 8.9–10.3)
Chloride: 98 mmol/L (ref 98–111)
Chloride: 103 mmol/L (ref 98–111)
Creatinine, Ser: 6.42 mg/dL — ABNORMAL HIGH (ref 0.61–1.24)
Creatinine, Ser: 6.84 mg/dL — ABNORMAL HIGH (ref 0.61–1.24)
GFR, Estimated: 9 mL/min — ABNORMAL LOW (ref >60)
GFR, Estimated: 8 mL/min — ABNORMAL LOW (ref >60)
Glucose, Bld: 168 mg/dL — ABNORMAL HIGH (ref 70–99)
Glucose, Bld: 185 mg/dL — ABNORMAL HIGH (ref 70–99)
Phosphorus: 5.2 mg/dL — ABNORMAL HIGH (ref 2.5–4.6)
Phosphorus: 5.8 mg/dL — ABNORMAL HIGH (ref 2.5–4.6)
Potassium: 5.2 mmol/L — ABNORMAL HIGH (ref 3.5–5.1)
Potassium: 4.2 mmol/L (ref 3.5–5.1)
Sodium: 135 mmol/L (ref 135–145)
Sodium: 136 mmol/L (ref 135–145)

## 2021-02-05 LAB — GLUCOSE, CAPILLARY
Glucose-Capillary: 148 mg/dL — ABNORMAL HIGH (ref 70–99)
Glucose-Capillary: 149 mg/dL — ABNORMAL HIGH (ref 70–99)
Glucose-Capillary: 157 mg/dL — ABNORMAL HIGH (ref 70–99)
Glucose-Capillary: 160 mg/dL — ABNORMAL HIGH (ref 70–99)
Glucose-Capillary: 160 mg/dL — ABNORMAL HIGH (ref 70–99)
Glucose-Capillary: 171 mg/dL — ABNORMAL HIGH (ref 70–99)
Glucose-Capillary: 183 mg/dL — ABNORMAL HIGH (ref 70–99)

## 2021-02-05 LAB — MAGNESIUM: Magnesium: 2 mg/dL (ref 1.7–2.4)

## 2021-02-05 MED ORDER — ONDANSETRON HCL 4 MG/2ML IJ SOLN
4.0000 mg | Freq: Four times a day (QID) | INTRAMUSCULAR | Status: DC
  Administered 2021-02-05 – 2021-02-12 (×25): 4 mg via INTRAVENOUS
  Filled 2021-02-05 (×25): qty 2

## 2021-02-05 MED ORDER — LEVETIRACETAM IN NACL 500 MG/100ML IV SOLN
500.0000 mg | Freq: Two times a day (BID) | INTRAVENOUS | Status: DC
  Administered 2021-02-05 – 2021-02-10 (×10): 500 mg via INTRAVENOUS
  Filled 2021-02-05 (×14): qty 100

## 2021-02-05 MED ORDER — SODIUM CHLORIDE 0.9 % IV SOLN: INTRAVENOUS | Status: DC

## 2021-02-05 MED ORDER — SODIUM CHLORIDE 0.9 % IV SOLN
12.5000 mg | Freq: Four times a day (QID) | INTRAVENOUS | Status: DC | PRN
  Administered 2021-02-05 – 2021-02-24 (×2): 12.5 mg via INTRAVENOUS
  Filled 2021-02-05: qty 0.5
  Filled 2021-02-05: qty 12.5
  Filled 2021-02-05 (×2): qty 0.5

## 2021-02-05 MED ORDER — ENOXAPARIN SODIUM 100 MG/ML IJ SOSY
90.0000 mg | PREFILLED_SYRINGE | INTRAMUSCULAR | Status: DC
  Administered 2021-02-05 – 2021-02-07 (×3): 90 mg via SUBCUTANEOUS
  Filled 2021-02-05 (×3): qty 1

## 2021-02-05 MED ORDER — LORAZEPAM 2 MG/ML IJ SOLN
0.5000 mg | Freq: Four times a day (QID) | INTRAMUSCULAR | Status: DC | PRN
  Administered 2021-02-05 – 2021-02-10 (×10): 0.5 mg via INTRAVENOUS
  Filled 2021-02-05 (×10): qty 1

## 2021-02-05 MED ORDER — DEXTROSE-NACL 5-0.9 % IV SOLN: INTRAVENOUS | Status: DC

## 2021-02-05 MED ORDER — FAMOTIDINE 20 MG IN NS 100 ML IVPB
20.0000 mg | INTRAVENOUS | Status: DC
  Administered 2021-02-05: 20 mg via INTRAVENOUS
  Filled 2021-02-05 (×2): qty 100

## 2021-02-05 MED ORDER — PANTOPRAZOLE SODIUM 40 MG IV SOLR
40.0000 mg | Freq: Two times a day (BID) | INTRAVENOUS | Status: DC
  Administered 2021-02-05 – 2021-02-08 (×7): 40 mg via INTRAVENOUS
  Filled 2021-02-05 (×8): qty 40

## 2021-02-05 NOTE — Progress Notes (Signed)
Pt is back to the unit.

## 2021-02-05 NOTE — Consult Note (Signed)
Referring Provider:  Dr. Doristine Bosworth, [facility] Primary Care Physician:  [name], NP Primary Gastroenterologist:  Althia Forts, seen by Dr. Henrene Pastor as inpatient 08/2020  Reason for Consultation:  Nausea, vomiting  HPI: Thomas [name] is a 65 y.o. male with a history of T2DM with neuropathy, HTN, HLD, colon cancer s/p resection and colostomy May 2022 s/p 3 cycles chemotherapy (last was July 2022)--followed by Dr. Benay Spice.   Patient has been in the hospital for 66 days with a complicated course.  This is outlined in the hospitalist note as follows:  "Started CAPOX June 2022, reduced cepecitabine dose starting 11/19/2020 due to hand/foot syndrome, reduced oxaliplatin due to renal impairment and poor performance status. Presented to the ED 7/24 with abdominal pain found to be febrile, tachycardic, tachypneic. Lactic acid was 6.6 and persisted at 6.8 after sepsis IV fluid bolus and cefepime. He had ARF, demand ischemia, bandemia, was hyperglycemic with negative ketones, with lactic acidosis. He developed AFib with RVR in the ED, was started on diltiazem, and was admitted to the ICU for septic shock with unclear source. Sepsis was subsequently felt to be ruled out at time of admission, presentation felt more likely to be related to cepacitabine toxicity with oral mucositis, hand/foot syndrome, and diarrhea/high output colostomy.   He was transferred to hospitalist service 7/27 but developed metabolic encephalopathy with hyoxia due to aspiration pneumonia on 7/30 requiring intubation and pressor support. Failed extubation with inability to clear secretions, requiring reintubation 8/8, and tracheostomy subsequently placed 8/11. This was pulled out 8/22, replaced under bronch guidance, again removed 8/25 requiring reintubation after failed reinsertion. ENT revised tracheostomy in the OR 8/29. He has required IHD for ARF, and also had complications of RLE DVT now on eliquis, colitis thought to be ischemic 8/2 not  requiring specific intervention, Pseudomonas pneumonia having completed treatment, agitated encephalopathy which has improved, anemia requiring 4u PRBCs total, and thrombocytopenia which has resolved. On 9/4 he was transferred to hospitalist service having tolerated trach collar.  Patient was also evaluated by Dr. Benay Spice during this admission clarified that patient was receiving curative chemotherapy prior to admission."  Patient has had ongoing complaints of mid to epigastric abdominal pain and nausea and vomiting.  He has had 3 CT scans of the September.  Surgery has evaluated the patient and found no need for surgical intervention.  Pantoprazole 40 mg IV twice daily and Pepcid 20 mg IV daily has just been started today.  He has been getting nystatin for thrush.  He has scopolamine patch, but Phenergan is only ordered as needed.  He is difficult to understand due to the trach but he says that the vomiting just wakes him up, occurs at night.  Had brown colored emesis overnight but none so far today.  He is visible frustrated and tearful.  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   Dizziness    Dyspnea    pt states d/t weight   Epilepsy ([place])    takes Keppra daily   GERD (gastroesophageal reflux disease)    takes Omeprazole daily   Headache    Hyperlipidemia  takes Atorvastatin daily   Hypertension    takes Lotrel daily   Peripheral edema    takes Lasix daily   Peripheral neuropathy    takes Gabapentin daily   Pneumonia 3 yrs ago   hx of   Seizures ([place])    Sleep apnea    Urinary frequency     Past Surgical History:  Procedure Laterality Date   BIOPSY  09/05/2020    Procedure: BIOPSY;  Surgeon: Irene Shipper, MD;  Location: [facility] ENDOSCOPY;  Service: Endoscopy;;   CARDIAC CATHETERIZATION  7 yrs ago   COLONOSCOPY WITH PROPOFOL N/A 09/05/2020   Procedure: COLONOSCOPY WITH PROPOFOL;  Surgeon: Irene Shipper, MD;  Location: [facility] ENDOSCOPY;  Service: Endoscopy;  Laterality: N/A;   CRANIOTOMY N/A 09/01/2016   Procedure: CRANIOTOMY TUMOR  LEFT PTERIONAL;  Surgeon: Ashok Pall, MD;  Location: [facility];  Service: Neurosurgery;  Laterality: N/A;   cyst removed from chest      as a child   IR FLUORO GUIDE CV LINE LEFT  12/22/2020   IR US GUIDE VASC ACCESS LEFT  12/22/2020   NO PAST SURGERIES     PARTIAL COLECTOMY N/A 09/10/2020   Procedure: TRANSVERSE COLECTOMY;  Surgeon: [name], MD;  Location: [place];  Service: General;  Laterality: N/A;   POLYPECTOMY  09/05/2020   Procedure: POLYPECTOMY;  Surgeon: Irene Shipper, MD;  Location: [place];  Service: Endoscopy;;   PORTACATH PLACEMENT Right 10/01/2020   Procedure: INSERTION PORT-A-CATH;  Surgeon: [name], MD;  Location: [place];  Service: General;  Laterality: Right;   SUBMUCOSAL TATTOO INJECTION  09/05/2020   Procedure: SUBMUCOSAL TATTOO INJECTION;  Surgeon: Irene Shipper, MD;  Location: [place];  Service: Endoscopy;;   TRACHEOSTOMY TUBE PLACEMENT N/A 01/05/2021   Procedure: TRACHEOSTOMY;  Surgeon: [name], MD;  Location: [place];  Service: ENT;  Laterality: N/A;    Prior to Admission medications   Medication Sig Start Date End Date Taking? Authorizing Provider  acetaminophen (TYLENOL) 500 MG tablet Take 1,000 mg by mouth 3 (three) times daily as needed for headache (pain).   Yes [provider]  albuterol (PROVENTIL HFA;VENTOLIN HFA) 108 (90 BASE) MCG/ACT inhaler Inhale 2 puffs into the lungs every 4 (four) hours as needed for wheezing or shortness of breath. 04/09/15  Yes Muthersbaugh, Jarrett Soho, PA-C  amLODipine (NORVASC) 10 MG tablet Take 10 mg by mouth daily. 05/11/20  Yes [provider]  capecitabine (XELODA) 500 MG tablet Take 3 tablets (1,500 mg total) by mouth 2 (two) times daily after a meal. 11/19/20  Yes Ladell Pier, MD  gabapentin (NEURONTIN) 300 MG capsule Take 1 capsule (300 mg total) by mouth 3 (three) times daily. 04/06/16  Yes Varney Biles, MD  levETIRAcetam (KEPPRA) 1000 MG tablet Take 1,000 mg by mouth 2 (two) times daily. 07/28/16  Yes [provider]  metFORMIN (GLUCOPHAGE-XR) 500 MG 24 hr tablet Take 500 mg by mouth 2 (two) times daily. 07/27/20  Yes [provider]  omeprazole (PRILOSEC) 20 MG capsule Take 20 mg by mouth daily. 08/10/16  Yes [provider]  atorvastatin (LIPITOR) 20 MG tablet Take 20 mg by mouth daily at 6 PM.    [provider]  lidocaine-prilocaine (EMLA) cream Apply 1 application topically as directed. Apply 1/2 tablespoon to port site 2 hours prior to stick and cover with plastic wrap to numb site Patient not taking: Reported on 10/08/2020 09/24/20   Ladell Pier, MD  ondansetron (  ZOFRAN) 8 MG tablet Take 1 tablet (8 mg total) by mouth every 8 (eight) hours as needed for nausea or vomiting. Patient not taking: No sig reported 09/24/20   Ladell Pier, MD  prochlorperazine (COMPAZINE) 10 MG tablet Take 1 tablet (10 mg total) by mouth every 6 (six) hours as needed. Patient not taking: No sig reported 09/24/20   Ladell Pier, MD    Current Facility-Administered Medications  Medication Dose Route Frequency Provider Last Rate Last Admin   0.9 %  sodium chloride infusion   Intravenous PRN Renee Pain, MD   Stopped at 12/20/20 0955   acetaminophen (TYLENOL) 160 MG/5ML solution 650 mg  650 mg Oral Q6H PRN Vernelle Emerald, MD   650 mg at 02/02/21 2033   albuterol (PROVENTIL) (2.5 MG/3ML) 0.083% nebulizer solution 2.5 mg  2.5 mg Nebulization Q4H PRN Spero Geralds, MD   2.5 mg at 12/27/20 0559   anticoagulant sodium citrate solution 5 mL  5 mL Intracatheter Q T,Th,Sa-HD Olalere, Adewale A, MD    5 mL at 02/03/21 1133   chlorhexidine gluconate (MEDLINE KIT) (PERIDEX) 0.12 % solution 15 mL  15 mL Mouth Rinse BID Tawni Millers, MD   15 mL at 02/05/21 4098   Chlorhexidine Gluconate Cloth 2 % PADS 6 each  6 each Topical Q0600 Edrick Oh, MD   6 each at 02/05/21 0622   dextrose 5 %-0.9 % sodium chloride infusion   Intravenous Continuous Samella Parr, NP 75 mL/hr at 02/05/21 1000 Infusion Verify at 02/05/21 1000   dextrose 50 % solution 0-50 mL  0-50 mL Intravenous PRN Renee Pain, MD   50 mL at 01/01/21 1134   enoxaparin (LOVENOX) injection 90 mg  90 mg Subcutaneous Q24H Darliss Cheney, MD       famotidine (PEPCID) IVPB 20 mg in NS 100 mL IVPB  20 mg Intravenous Q24H Samella Parr, NP       feeding supplement (NEPRO CARB STEADY) liquid 1,000 mL  1,000 mL Per Tube Q24H Arrien, Jimmy Picket, MD   Held at 02/03/21 1947   feeding supplement (PROSource TF) liquid 45 mL  45 mL Per Tube BID Tawni Millers, MD   45 mL at 02/04/21 2124   fentaNYL (SUBLIMAZE) injection 12.5 mcg  12.5 mcg Intravenous Q6H PRN Samella Parr, NP   12.5 mcg at 02/05/21 0928   glycopyrrolate (ROBINUL) injection 0.1 mg  0.1 mg Intravenous Q4H PRN Richarda Osmond, MD   0.1 mg at 02/01/21 2044   hydrocerin (EUCERIN) cream   Topical BID Ladell Pier, MD   Given at 02/05/21 0936   insulin aspart (novoLOG) injection 0-15 Units  0-15 Units Subcutaneous Q4H Cristal Generous, NP   2 Units at 02/05/21 1191   insulin detemir (LEVEMIR) injection 10 Units  10 Units Subcutaneous Daily Tawni Millers, MD   10 Units at 02/05/21 0941   levETIRAcetam (KEPPRA) IVPB 500 mg/100 mL premix  500 mg Intravenous Q12H Samella Parr, NP   Stopped at 02/05/21 0947   lip balm (CARMEX) ointment   Topical PRN Raiford Noble Latif, DO       LORazepam (ATIVAN) injection 0.5 mg  0.5 mg Intravenous Q6H PRN Samella Parr, NP   0.5 mg at 02/05/21 4782   MEDLINE mouth rinse  15 mL Mouth Rinse 10 times per  day Noemi Chapel P, DO   15 mL at 02/05/21 0937   metoprolol tartrate (LOPRESSOR) injection 5 mg  5 mg Intravenous Q4H PRN Samella Parr, NP       nystatin (MYCOSTATIN) 100000 UNIT/ML suspension 500,000 Units  5 mL Mouth/Throat QID Arrien, Jimmy Picket, MD   500,000 Units at 02/05/21 0933   pantoprazole (PROTONIX) injection 40 mg  40 mg Intravenous Q12H Samella Parr, NP   40 mg at 02/05/21 9892   promethazine (PHENERGAN) 12.5 mg in sodium chloride 0.9 % 50 mL IVPB  12.5 mg Intravenous Q6H PRN Chotiner, Yevonne Aline, MD 200 mL/hr at 02/05/21 0416 12.5 mg at 02/05/21 0416   promethazine (PHENERGAN) 6.25 MG/5ML syrup 12.5 mg  12.5 mg Oral Q6H PRN Samella Parr, NP   12.5 mg at 02/04/21 1707   scopolamine (TRANSDERM-SCOP) 1 MG/3DAYS 1.5 mg  1 patch Transdermal Q72H Margaretha Seeds, MD   1.5 mg at 02/04/21 1710   sodium chloride flush (NS) 0.9 % injection 10-40 mL  10-40 mL Intracatheter PRN Arrien, Jimmy Picket, MD   10 mL at 01/31/21 0438    Allergies as of 11/30/2020   (No Known Allergies)    Family History  Problem Relation Age of Onset   Breast cancer Mother    Breast cancer Maternal Aunt    Prostate cancer Maternal Uncle     Social History   Socioeconomic History   Marital status: Married    Spouse name: Cytogeneticist   Number of children: 4   Years of education: Not on file   Highest education level: Not on file  Occupational History   Occupation: Disabled  Tobacco Use   Smoking status: Former   Smokeless tobacco: Never   Tobacco comments:    quit smoking 35 yrs ago  Vaping Use   Vaping Use: Never used  Substance and Sexual Activity   Alcohol use: No   Drug use: No   Sexual activity: Not on file  Other Topics Concern   Not on file  Social History Narrative   Married to his wife, Jerlyn Ly with total of #4 children (#2 with current wife). Lives in home with his son and daughter-in-law.     Social Determinants of Health   Financial Resource Strain: Not on file   Food Insecurity: Not on file  Transportation Needs: Not on file  Physical Activity: Not on file  Stress: Not on file  Social Connections: Not on file  Intimate Partner Violence: Not on file    Review of Systems: ROS is O/W negative   Physical Exam: Vital signs in last 24 hours: Temp:  [98 F (36.7 C)-98.9 F (37.2 C)] 98 F (36.7 C) (09/29 0800) Pulse Rate:  [97-102] 101 (09/29 0800) Resp:  [19-22] 22 (09/29 0800) BP: (139-165)/(74-93) 165/93 (09/29 0800) SpO2:  [100 %] 100 % (09/29 0800) FiO2 (%):  [28 %] 28 % (09/29 0810) Weight:  [98.5 kg] 98.5 kg (09/29 0500) Last BM Date: 02/05/21 General:   Alert,  Well-developed, well-nourished, pleasant and cooperative in NAD Head:  Normocephalic and atraumatic. Eyes:  Sclera clear, no icterus.  Conjunctiva pink. Ears:  Normal auditory acuity. Mouth:  No deformity or lesions.   Lungs:  Clear throughout to auscultation.  No wheezes, crackles, or rhonchi.  Heart:  Regular rate and rhythm; no murmurs, clicks, rubs, or gallops. Abdomen:  Soft,nontender, BS active,nonpalp mass or hsm.   Rectal:  Deferred  Msk:  Symmetrical without gross deformities. Pulses:  Normal pulses noted. Extremities:  Without clubbing or edema. Neurologic:  Alert and oriented x4;  grossly normal neurologically. Skin:  Intact  without significant lesions or rashes. Psych:  Alert and cooperative. Normal mood and affect.  Intake/Output from previous day: 09/28 0701 - 09/29 0700 In: 741.7 [I.V.:471.7; NG/GT:220; IV Piggyback:50] Out: 525 [Urine:450; Stool:75] Intake/Output this shift: Total I/O In: 164.7 [I.V.:64.7; IV Piggyback:100] Out: -   Lab Results: Recent Labs    02/03/21 0647  WBC 18.7*  HGB 8.5*  HCT 25.4*  PLT 381   BMET Recent Labs    02/03/21 0647 02/04/21 0400 02/05/21 0244  NA 135 135 135  K 3.4* 3.6 5.2*  CL 98 95* 98  CO2 21* 25 21*  GLUCOSE 141* 146* 168*  BUN 79* 48* 62*  CREATININE 6.07* 4.86* 6.42*  CALCIUM 9.4 9.3 9.5    LFT Recent Labs    02/05/21 0244  ALBUMIN 3.1*   Studies/Results: CT ABDOMEN PELVIS WO CONTRAST  Result Date: 02/05/2021 CLINICAL DATA:  Left lower quadrant abdominal pain EXAM: CT ABDOMEN AND PELVIS WITHOUT CONTRAST TECHNIQUE: Multidetector CT imaging of the abdomen and pelvis was performed following the standard protocol without IV contrast. COMPARISON:  CT abdomen/pelvis dated 01/30/2021, 01/26/2021, and 12/27/2020. FINDINGS: Lower chest: Progressive patchy right lower lobe opacity, likely atelectasis, less likely pneumonia. Hepatobiliary: Liver is within normal limits. Gallbladder is unremarkable. No intrahepatic or extrahepatic ductal dilatation. Pancreas: Within normal limits. Spleen: Within normal limits. Adrenals/Urinary Tract: Adrenal glands are within normal limits. 4.0 cm lateral interpolar left renal cyst (series 3/image 38). Right kidney is within normal limits. No renal calculi or hydronephrosis. Bladder is decompressed by an indwelling Foley catheter. Stomach/Bowel: Stomach is within normal limits. Enteric tube terminates in the duodenal bulb. No evidence of bowel obstruction. Status post transverse colectomy with right mid abdominal ileostomy. Vascular/Lymphatic: No evidence of abdominal aortic aneurysm. Atherosclerotic calcifications of the abdominal aorta and branch vessels. No suspicious abdominopelvic lymphadenopathy. Reproductive: Prostate is unremarkable. Other: No abdominopelvic ascites. Small fat containing right inguinal hernia. Moderate fat containing left inguinal hernia (series 3/image 90). Musculoskeletal: Mild degenerative changes at L3-4. IMPRESSION: Progressive patchy right lower lobe opacity, likely atelectasis, less likely pneumonia. Additional stable ancillary and postsurgical findings from multiple recent prior CTs. Electronically Signed   By: Julian Hy M.D.   On: 02/05/2021 02:31   DG Abd 2 Views  Result Date: 02/04/2021 CLINICAL DATA:  Left lower  quadrant abdominal pain. EXAM: ABDOMEN - 2 VIEW COMPARISON:  CT scan 01/30/2021 and abdominal radiograph from yesterday. FINDINGS: Stable mild eventration of the right hemidiaphragm with some overlying atelectasis. The feeding tube tip is in the antropyloric region of the stomach. Stable air-filled small bowel loops in the right abdomen but no significant distension. No free air. Right lower quadrant ostomy noted. IMPRESSION: Feeding tube tip is in the antropyloric region of the stomach. No significant bowel distention or free air. Electronically Signed   By: Marijo Sanes M.D.   On: 02/04/2021 12:59   DG Abd Portable 1V  Result Date: 02/05/2021 CLINICAL DATA:  Ileus EXAM: PORTABLE ABDOMEN - 1 VIEW COMPARISON:  Abdominal radiographs 02/04/2021, CT abdomen/pelvis 02/05/2021 FINDINGS: Enteric catheter is in place with the tip projecting over the expected location of the pylorus/first portion of the duodenum. There is a relative paucity of bowel gas throughout the abdomen with a single gas distended loop of bowel in the right hemiabdomen measuring up to 3.2 cm, nonspecific. There is no evidence of mechanical obstruction. The bones are unremarkable. IMPRESSION: 1. Relative paucity of bowel gas throughout the abdomen without evidence of mechanical obstruction. 2. Enteric catheter tip at the  expected location of the pylorus/first portion of the duodenum. Electronically Signed   By: Valetta Mole M.D.   On: 02/05/2021 10:02    IMPRESSION:  *Nausea and vomiting with abdominal pain and mid to upper abdomen:  No obvious issues by CT scan x 2.  Surgery thinks more of a functional motility issue.  He is certainly at risk for ulcer disease given all the stress that his body has been under with recent events.  He has not been on any type of PPI therapy until it was initiated today.  His nausea and vomiting could certainly multifactorial, but will rule out ulcer disease, esophagitis, and other intraluminal  abnormalities. *Stage 3b colon cancer s/p open partial colectomy with transverse colostomy 09/10/20 with Dr. Bobbye Morton *Sepsis vs reaction to chemotherapy with shock - resolved *Acute on chronic hypoxic respiratory failure/fungal PNA - on trach collar *Fungal UTI:  Completed treatment with diflucan on 9/27. 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 *Hx of MCA CVA *Hx of epilepsy on Keppra *T2DM  PLAN: -We will plan for EGD on 9/30 tentatively 9 AM with Dr. Hilarie Fredrickson -Agree with starting pantoprazole 40 mg IV twice daily and Pepcid 20 mg IV daily. -I am going to schedule Zofran 4 mg IV given given every 6 hours around-the-clock and Phenergan can be used for breakthrough. -Continue nystatin suspension for now.   Laban Emperor. Geanie Pacifico  02/05/2021, 10:43 AM

## 2021-02-05 NOTE — Progress Notes (Signed)
Patient continues to experience intermittent abd pain with N/V. Pt had one episode of brown projectile vomiting. On call MD was notified, new orders noted, see mar for detail.

## 2021-02-05 NOTE — Plan of Care (Signed)
  Problem: Education: Goal: Knowledge of General Education information will improve Description: Including pain rating scale, medication(s)/side effects and non-pharmacologic comfort measures Outcome: Progressing   Problem: Activity: Goal: Risk for activity intolerance will decrease Outcome: Progressing   Problem: Nutrition: Goal: Adequate nutrition will be maintained Outcome: Progressing   Problem: Coping: Goal: Level of anxiety will decrease Outcome: Progressing   Problem: Elimination: Goal: Will not experience complications related to bowel motility Outcome: Progressing   

## 2021-02-05 NOTE — Progress Notes (Addendum)
[facility] PROGRESS NOTE  Thomas [name] TGP:[id number] DOB: 07-15-1955 DOA: 11/30/2020 PCP: [name], NP  Status: Remains inpatient appropriate because:Persistent severe electrolyte disturbances, Unsafe d/c plan, and Inpatient level of care appropriate due to severity of illness  Dispo: The patient is from: Home              Anticipated d/c is to: SNF versus LTAC vs CIR              Patient currently is not medically stable to d/c.   Difficult to place patient Yes               Barriers to discharge: Currently has tracheostomy in place and is requiring dialysis but remains too weak to tolerate dialysis in chair which is a requirement for outpatient HD    Level of care: Progressive  Code Status: Full Family Communication: Attending physician spoke with wife at bedside 9/26 DVT prophylaxis: Eliquis COVID vaccination status: Moderna 07/08/2019 and 08/11/2019 with first booster dose given 08/05/2020   HPI:  65 y.o. male with a history of T2DM with neuropathy, HTN, HLD., colon cancer s/p resection and colostomy May 2022 s/p 3 cycles chemotherapy (last was July 2022). Followed by Dr. Benay Spice. Started CAPOX June 2022, reduced cepecitabine dose starting 11/19/2020 due to hand/foot syndrome, reduced oxaliplatin due to renal impairment and poor performance status. Presented to the ED 7/24 with abdominal pain found to be febrile, tachycardic, tachypneic. Lactic acid was 6.6 and persisted at 6.8 after sepsis IV fluid bolus and cefepime. He had ARF, demand ischemia, bandemia, was hyperglycemic with negative ketones, with lactic acidosis. He developed AFib with RVR in the ED, was started on diltiazem, and was admitted to the ICU for septic shock with unclear source. Sepsis was subsequently felt to be ruled out at time of admission, presentation felt more likely to be related to cepacitabine toxicity with oral mucositis, hand/foot syndrome, and diarrhea/high output colostomy.   He was transferred  to hospitalist service 7/27 but developed metabolic encephalopathy with hyoxia due to aspiration pneumonia on 7/30 requiring intubation and pressor support. Failed extubation with inability to clear secretions, requiring reintubation 8/8, and tracheostomy subsequently placed 8/11. This was pulled out 8/22, replaced under bronch guidance, again removed 8/25 requiring reintubation after failed reinsertion. ENT revised tracheostomy in the OR 8/29. He has required IHD for ARF, and also had complications of RLE DVT now on eliquis, colitis thought to be ischemic 8/2 not requiring specific intervention, Pseudomonas pneumonia having completed treatment, agitated encephalopathy which has improved, anemia requiring 4u PRBCs total, and thrombocytopenia which has resolved. On 9/4 he was transferred to hospitalist service having tolerated trach collar.  Patient was also evaluated by Dr. Benay Spice during this admission clarified that patient was receiving curative chemotherapy prior to admission  Subjective: Awake.  Complaining of abdominal discomfort and constant nausea.  Documented to have had an episode of brown emesis overnight.  Objective: Vitals:   02/04/21 2040 02/05/21 0053  BP: (!) 152/79 139/84  Pulse: (!) 101 (!) 102  Resp: 19 20  Temp: 98.9 F (37.2 C)   SpO2: 100%     Intake/Output Summary (Last 24 hours) at 02/05/2021 0732 Last data filed at 02/05/2021 0449 Gross per 24 hour  Intake 741.74 ml  Output 525 ml  Net 216.74 ml   Filed Weights   02/03/21 0905 02/03/21 1125 02/05/21 0500  Weight: 99.5 kg 98.5 kg 98.5 kg    Exam: Constitutional: NAD, calm, uncomfortable 2/2 abdominal pain and  constant nausea Respiratory: #6 XLT uncuffed trach 9/9; 28% trach collar, sounds are clear, somewhat diminished in the bases likely secondary to splinting from abdominal discomfort.  Normal pulse oximetry reading Cardiovascular: S1-S2, BP normal, extremities warm to touch, regular pulse Abdomen: Abdomen  soft and not distended.  With palpation noted with supraumbilical abdominal pain without guarding or rebounding.  Bowel sounds are present.  Stoma pink with thick liquid stool in ostomy bag with some flatus Genitourinary: Foley catheter in place with dark yellow urine noted in drainage bag Neurologic: CN 2-12 grossly intact. Sensation intact, Strength 3/5 x all 4 extremities.  Psychiatric: Normal judgment and insight. Alert and oriented x 3.  Anxious and began to cry due to multitude of issues occurring    Assessment/Plan: Acute problems:  Acute hypoxic respiratory failure s/p tracheostomy due to aspiration pneumonia  Recurrent pansensitive Pseudomonas tracheobronchitis -Tolerating PMV.  Trach team wants to make sure patient more mobile with stronger vocalization before trying capping trials. -Continue Robinul  -SLP following for PMV training.  Recurrent abdominal pain/constant nausea -Continues to have abdominal pain reproducible with palpation -Episode of emesis brown-colored overnight prompting on-call physician to order CT abdomen pelvis that was unremarkable -Continue n.p.o., change necessary medications to IV, appears to be dry noting urine now dark so we will change IV fluid to D5NS at physiologic rate of 75 cc/h since patient is on dialysis -Surgical team has evaluated the patient and finds no acute surgical issues to explain his symptoms; sx's more c/w functional motility issues -Keep K > 4.0 and Mg > 2.0--follow labs -GI consulted.  Possible EGD tomorrow at 9 AM with Dr. Hilarie Fredrickson.  Zofran resumed and placed on a scheduled administration with Phenergan for breakthrough nausea and vomiting -Begin empiric IV Protonix twice daily and IV Pepcid twice daily  Funguria -Diflucan completed 9/27  Thrush -Continue nystatin  Inability to elevate tongue -Patient had been noted to have difficulty with phonation as well as continued issues with dysphagia despite being very alert -SLP  evaluated and noted patient is unable to elevate tongue properly bilaterally -Neurology has been consulted-MRI pending to determine if evidence of pseudobulbar palsy  Urinary retention -Has completed Diflucan as of 9/28 -Given ongoing issues with abdominal pain and possible obstruction physiology will not discontinue Foley at this time   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   - Continue HD TTS -Initially not expected to recover renal function -nephrology now documenting that he may be headed towards renal recovery.   -Nephrology plans dialysis Friday and then giving the weekend off, then evaluating for any signs of recovery.   High output ostomy, diarrhea:  -Infectious work-up negative -Recent PSBO likely caused by Imodium and discontinued   Anemia of critical illness, acute blood loss as well as anemia of chronic disease and iron deficiency:  - s/p 4u PRBCs this hospitalization  - IV iron TTS x8 doses per nephrology. -Current hemoglobin stable at 10.6 with normal platelets   Stage IIIb colon CA s/p partial transverse colectomy with end colostomy May 2022:  - Palliative care has been involved during this hospitalization- goals remain clear: full aggressive measures desired.  Documented patient was on curative chemotherapy prior to admission   Moderate protein calorie malnutrition/dysphagia -Tube feeding and nutritional supplements on hold given recurrent nausea, vomiting and abdominal pain -Tolerating PMV and able to phonate  Physical deconditioning/back pain -Secondary to prolonged critical illness and likely  associated critical illness myopathy -continue OOB to chair TID -Continue Oxy IR for back pain   Acute extensive right leg DVT:  -Did not have significant edema therefore did not require thrombolysis -Eliquis on hold in favor of Lovenox given recurrent nausea and vomiting   New onset atrial  fibrillation with RVR:  -Currently maintaining sinus rhythm w/o meds -See above regarding holding anticoagulation secondary to recurrent nausea and vomiting.  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   Uncontrolled T2DM on OHAs prior to admission with associated neuropathy:  HbA1c 8.7% at admission. - Continue levemir and SSI  -Gabapentin on hold due to recurrent nausea and vomiting   HTN:  -Norvasc on hold secondary to N/V   HLD:  -Atorvastatin on hold due to nausea and vomiting   Thrombocytopenia:  -Transient likely due to critical illness. HIT Ab negative on 8/5.    ?Sepsis - initially thought to be present on admission- has been ruled out.        Data Reviewed: Basic Metabolic Panel: Recent Labs  Lab 02/01/21 0410 02/02/21 0520 02/03/21 0647 02/04/21 0400 02/05/21 0244  NA 134* 136 135 135 135  K 3.7 3.7 3.4* 3.6 5.2*  CL 96* 97* 98 95* 98  CO2 23 23 21* 25 21*  GLUCOSE 195* 124* 141* 146* 168*  BUN 39* 59* 79* 48* 62*  CREATININE 4.05* 5.24* 6.07* 4.86* 6.42*  CALCIUM 9.3 9.6 9.4 9.3 9.5  MG  --   --   --  2.0 2.0  PHOS 4.3 6.3* 6.1* 5.0* 5.2*   Liver Function Tests: Recent Labs  Lab 01/29/21 1233 01/30/21 1959 02/01/21 0410 02/02/21 0520 02/03/21 0647 02/04/21 0400 02/05/21 0244  AST 25  --   --   --   --   --   --   ALT 31  --   --   --   --   --   --   ALKPHOS 116  --   --   --   --   --   --   BILITOT 0.6  --   --   --   --   --   --   PROT 7.9  --   --   --   --   --   --   ALBUMIN 2.7*   < > 2.7* 2.6* 2.8* 3.0* 3.1*   < > = values in this interval not displayed.    CBC: Recent Labs  Lab 01/29/21 0751 01/29/21 1233 01/30/21 1937 01/31/21 0814 02/03/21 0647  WBC 14.1* 18.0* 23.9* 19.1* 18.7*  NEUTROABS  --  14.4* 16.9*  --   --   HGB 8.3* 9.2* 9.2* 8.5* 8.5*  HCT 24.4* 27.3*  26.7* 25.5* 25.4*  MCV 85.6 86.4 84.5 86.1 85.5  PLT 309 344 381 324 381    CBG: Recent Labs  Lab 02/04/21 1608 02/04/21 1954 02/05/21 0000 02/05/21 0424 02/05/21 0721  GLUCAP 134* 159* 171* 160* 149*    Scheduled Meds:  chlorhexidine gluconate (MEDLINE KIT)  15 mL Mouth Rinse BID   Chlorhexidine Gluconate Cloth  6 each Topical Q0600   enoxaparin (LOVENOX) injection  100 mg Subcutaneous Q24H   feeding supplement (NEPRO CARB STEADY)  1,000 mL Per Tube Q24H   feeding supplement (PROSource TF)  45 mL Per Tube BID  fiber  1 packet Per Tube BID   hydrocerin   Topical BID   insulin aspart  0-15 Units Subcutaneous Q4H   insulin detemir  10 Units Subcutaneous Daily   levETIRAcetam  500 mg Per Tube BID   mouth rinse  15 mL Mouth Rinse 10 times per day   multivitamin  1 tablet Per Tube QHS   nystatin  5 mL Mouth/Throat QID   polyethylene glycol  17 g Oral BID   scopolamine  1 patch Transdermal Q72H   Continuous Infusions:  sodium chloride Stopped (12/20/20 0955)   anticoagulant sodium citrate     dextrose 42 mL/hr at 02/04/21 1511   promethazine (PHENERGAN) injection (IM or IVPB) 12.5 mg (02/05/21 0416)    Principal Problem:   Diabetes mellitus with hyperosmolarity without hyperglycemic hyperosmolar nonketotic coma ([place]) Active Problems:   Lactic acidosis   Acute renal failure superimposed on stage 2 chronic kidney disease (HCC)   Metabolic acidosis   Tachycardia   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 ([place])   Adverse effect of chemotherapy   Pressure injury of skin   Atrial fibrillation with RVR ([place])   Sepsis (HCC)   Malnutrition of moderate degree   Acute metabolic encephalopathy   Acute respiratory failure with hypoxemia (HCC)   Acute renal failure with acute cortical necrosis (HCC)   Shock ([place])   Status post tracheostomy ([place])   Endotracheal tube present   Hemodialysis status ([place])   Palliative care by specialist   SOB (shortness of  breath)   Consultants: PCCM General surgery Nephrology Hematology/oncology Palliative care medicine team  Procedures: 2u PRBC 7/31, 1u PRBC 8/17, 1u PRBC 8/27 Tracheostomy revision 01/05/2021 Dr. Constance Holster [facility] 8/22    Antibiotics: Vancomycin 7/24 Cefepime 7/24 - 7/27 Unasyn 7/30 >> cefepime/flagyl 7/31 - 8/6     Time spent: 40 minutes    [name] ANP  [facility] 7 am - 330 pm/M-F for direct patient care and secure chat Please refer to Amion for contact info 66  days

## 2021-02-05 NOTE — Progress Notes (Signed)
SLP Cancellation Note  Patient Details Name: Thomas [name] MRN: [id number] DOB: 1956/03/19   Cancelled treatment:       Reason Eval/Treat Not Completed: Medical issues which prohibited therapy (Pt was approached for treatment to assess willingness to complete FEES today. Pt was moaning and crying with c/o abdominal pain, and pt's RN reported recent episodes of emesis. GI has been consulted, and it was agreed that SLP will f/u on subsequent date)  Jesika Men I. Hardin Negus, [place], [place] Office number [phone] Pager [place] 02/05/2021, 1:27 PM

## 2021-02-05 NOTE — Progress Notes (Signed)
[facility] Progress Note   65 year old gentleman with a history of colon cancer status post resection and colostomy May 2022.  Status post 3 cycles of chemotherapy last was in July 2022.  Type 2 diabetes with neuropathy hypertension hyperlipidemia presented to the emergency room 11/30/2020 with abdominal pain.  Was found to be secondary to sepsis.  Patient also was found to have acute kidney injury.  Developed atrial fibrillation with rapid ventricular rate and was admitted to intensive care unit for septic shock.  On 12/03/2020 he developed metabolic encephalopathy with hypoxia and aspiration pneumonia and required intubation 12/06/2020.  He also required pressor support.  He was reintubated since 12/15/2020 tracheostomy placed.  His hospital course  complicated by DVT.  It appears that he has recurrent Pseudomonas pneumonia.  He also has recurrent anemia requiring transfusion of 4 units of packed red blood cells.  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 CRRT initiated 7/31 and transitioned to IHD on 12/19/20.  S/p LIJ TDC placement 12/22/20.  We will closely monitor for signs of recovery.  There does seem to be some improvement in urine output.    Dialysis with 1.5 L removed 01/31/2021, 923 ml removed on 9/27.  Will plan on dialysis Friday and then giving the weekend off, then evaluating for any signs of recovery.     2.Urinary retention   Foley catheter placed     3.Volume overload/anasarca-improved with dialysis.   4.Sepsis/aspiration pneumonia/VDRF:aspiration pneumonia.  Tracheostomy placed   4.Stage IIIB colon cancer - sp partial colectomy, colostomy. On chemoRx sp 3 cycles.   5.Right leg DVT: On Eliquis.   6.Atrial fibrillation - on amiodarone and anticoagulation.     7.Anemia of critical illness: DC'ed  IV iron as concern for infection and on abx, transfuse as needed, holding  erythropoietin with active malignancy.  Monitor hemoglobin and transfuse as needed.   8. CKD-MBD; started sevelamer for hyperphosphatemia on 9/8, monitor phosphorus level.--improved 4.3-6.3   9. Disposition - poor overall prognosis.  Seen by palliative care team.  Discussion ongoing for possible transfer to LTAC/[facility].  The tracheostomy should be capped before he can be accepted to [facility]  Subjective:   Feels not well, fatigued and tired; not tolerating TF even at 1/2 rate -> off since  evening of 9/27. Also c/o abdominal pain.    Objective:   BP (!) 185/96   Pulse 100   Temp 98 F (36.7 C) (Oral)   Resp (!) 24   Ht _0  (1.727 m)   Wt 98.5 kg   SpO2 99%   BMI 33.02 kg/m   Intake/Output Summary (Last 24 hours) at 02/05/2021 1231 Last data filed at 02/05/2021 1000 Gross per 24 hour  Intake 906.39 ml  Output 525 ml  Net 381.39 ml   Weight change: -1 kg  Physical Exam: General: Ill-looking male, with tracheostomy on trach collar, not in distress Heart:RRR, s1s2 nl Lungs: normal wob Abdomen:soft, Non-tender, non-distended Extremities: No edema. Neurology: Alert awake and following commands but more uncomfortable appearing. Dialysis Access: [facility] in place.     Imaging: CT ABDOMEN PELVIS WO CONTRAST  Result Date: 02/05/2021 CLINICAL DATA:  Left lower quadrant abdominal pain EXAM: CT ABDOMEN AND PELVIS WITHOUT CONTRAST TECHNIQUE: Multidetector CT imaging of the abdomen and pelvis was performed following the standard protocol without IV contrast. COMPARISON:  CT abdomen/pelvis dated 01/30/2021, 01/26/2021, and 12/27/2020. FINDINGS: Lower chest:  Progressive patchy right lower lobe opacity, likely atelectasis, less likely pneumonia. Hepatobiliary: Liver is within normal limits. Gallbladder is unremarkable. No intrahepatic or extrahepatic ductal dilatation. Pancreas: Within normal limits. Spleen: Within normal limits. Adrenals/Urinary Tract: Adrenal glands are within normal  limits. 4.0 cm lateral interpolar left renal cyst (series 3/image 38). Right kidney is within normal limits. No renal calculi or hydronephrosis. Bladder is decompressed by an indwelling Foley catheter. Stomach/Bowel: Stomach is within normal limits. Enteric tube terminates in the duodenal bulb. No evidence of bowel obstruction. Status post transverse colectomy with right mid abdominal ileostomy. Vascular/Lymphatic: No evidence of abdominal aortic aneurysm. Atherosclerotic calcifications of the abdominal aorta and branch vessels. No suspicious abdominopelvic lymphadenopathy. Reproductive: Prostate is unremarkable. Other: No abdominopelvic ascites. Small fat containing right inguinal hernia. Moderate fat containing left inguinal hernia (series 3/image 90). Musculoskeletal: Mild degenerative changes at L3-4. IMPRESSION: Progressive patchy right lower lobe opacity, likely atelectasis, less likely pneumonia. Additional stable ancillary and postsurgical findings from multiple recent prior CTs. Electronically Signed   By: Julian Hy M.D.   On: 02/05/2021 02:31   DG Abd 2 Views  Result Date: 02/04/2021 CLINICAL DATA:  Left lower quadrant abdominal pain. EXAM: ABDOMEN - 2 VIEW COMPARISON:  CT scan 01/30/2021 and abdominal radiograph from yesterday. FINDINGS: Stable mild eventration of the right hemidiaphragm with some overlying atelectasis. The feeding tube tip is in the antropyloric region of the stomach. Stable air-filled small bowel loops in the right abdomen but no significant distension. No free air. Right lower quadrant ostomy noted. IMPRESSION: Feeding tube tip is in the antropyloric region of the stomach. No significant bowel distention or free air. Electronically Signed   By: Marijo Sanes M.D.   On: 02/04/2021 12:59   DG Abd Portable 1V  Result Date: 02/05/2021 CLINICAL DATA:  Ileus EXAM: PORTABLE ABDOMEN - 1 VIEW COMPARISON:  Abdominal radiographs 02/04/2021, CT abdomen/pelvis 02/05/2021 FINDINGS:  Enteric catheter is in place with the tip projecting over the expected location of the pylorus/first portion of the duodenum. There is a relative paucity of bowel gas throughout the abdomen with a single gas distended loop of bowel in the right hemiabdomen measuring up to 3.2 cm, nonspecific. There is no evidence of mechanical obstruction. The bones are unremarkable. IMPRESSION: 1. Relative paucity of bowel gas throughout the abdomen without evidence of mechanical obstruction. 2. Enteric catheter tip at the expected location of the pylorus/first portion of the duodenum. Electronically Signed   By: Valetta Mole M.D.   On: 02/05/2021 10:02    Labs: BMET Recent Labs  Lab 01/30/21 1959 01/31/21 0421 02/01/21 0410 02/02/21 0520 02/03/21 0647 02/04/21 0400 02/05/21 0244  NA 134* 134* 134* 136 135 135 135  K 4.2 4.0 3.7 3.7 3.4* 3.6 5.2*  CL 98 98 96* 97* 98 95* 98  CO2 19* _0 21* 25 21*  GLUCOSE 127* 144* 195* 124* 141* 146* 168*  BUN 54* 58* 39* 59* 79* 48* 62*  CREATININE 4.44* 4.63* 4.05* 5.24* 6.07* 4.86* 6.42*  CALCIUM 9.4 9.2 9.3 9.6 9.4 9.3 9.5  PHOS 3.0 5.8* 4.3 6.3* 6.1* 5.0* 5.2*   CBC Recent Labs  Lab 01/29/21 1233 01/30/21 1937 01/31/21 0814 02/03/21 0647  WBC 18.0* 23.9* 19.1* 18.7*  NEUTROABS 14.4* 16.9*  --   --   HGB 9.2* 9.2* 8.5* 8.5*  HCT 27.3* 26.7* 25.5* 25.4*  MCV 86.4 84.5 86.1 85.5  PLT 344 381 324 381    Medications:     chlorhexidine gluconate (MEDLINE  KIT)  15 mL Mouth Rinse BID   Chlorhexidine Gluconate Cloth  6 each Topical Q0600   enoxaparin (LOVENOX) injection  90 mg Subcutaneous Q24H   famotidine (PEPCID) IV  20 mg Intravenous Q24H   feeding supplement (NEPRO CARB STEADY)  1,000 mL Per Tube Q24H   feeding supplement (PROSource TF)  45 mL Per Tube BID   hydrocerin   Topical BID   insulin aspart  0-15 Units Subcutaneous Q4H   insulin detemir  10 Units Subcutaneous Daily   mouth rinse  15 mL Mouth Rinse 10 times per day   nystatin  5 mL  Mouth/Throat QID   pantoprazole (PROTONIX) IV  40 mg Intravenous Q12H   scopolamine  1 patch Transdermal Q72H      Otelia Santee, MD 02/05/2021, 12:31 PM

## 2021-02-05 NOTE — Progress Notes (Signed)
Occupational Therapy Treatment Patient Details Name: Thomas [name] MRN: [id number] DOB: 1956/01/18 Today's Date: 02/05/2021   History of present illness 66 y.o. male admitted 7/24 with high ostomy output. Pt with severe sepsis and multisystem organ failure. Hospital admission complicated by volume overload, aspiration pneumonia, acute respiratory failure, AKI and ischemic colitis. Intubated 7/30. S/p trach placement 8/11. CorTrak 8/12. HD initiated 8/12. To IR 8/15 for tunneled HD cath. Pulled out trach x2 8/22 replaced by PCCM. Pulled out trach again 8/25 but unable to be reinserted with pt intubated. 8/29 to OR for stoma clean up and trach replacement.Trach downsized 9/10.  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   OT comments  Patient with c/o increased abdominal pain limiting session.  Patient only able to reposition in bed.  MD in room and recommending GI consult.  Goals reviewed, and updated.  OT to continue efforts in the acute setting to maximize his functional status.  Plan is for eventual transition to SNF for post acute rehab.     Recommendations for follow up therapy are one component of a multi-disciplinary discharge planning process, led by the attending physician.  Recommendations may be updated based on patient status, additional functional criteria and insurance authorization.    Follow Up Recommendations  SNF;Supervision/Assistance - 24 hour    Equipment Recommendations       Recommendations for Other Services      Precautions / Restrictions Precautions Precautions: Fall Precaution Comments: cortrak, R subclavian tunneled HD cath, ostomy, O2 via trach collar, N&V Restrictions Weight Bearing Restrictions: No       Mobility Bed Mobility                General bed mobility comments: Min A to scoot higher for positional change    Transfers                      Balance                                           ADL either performed or assessed with clinical judgement   ADL   Eating/Feeding: NPO   Grooming: Wash/dry hands;Wash/dry face;Supervision/safety;Set up;Bed level           Upper Body Dressing : Moderate assistance;Bed level   Lower Body Dressing: Total assistance;Bed level                                                                                                           Pertinent Vitals/ Pain       Pain Assessment: Faces Faces Pain Scale: Hurts worst Pain Location: abdomen Pain Descriptors / Indicators: Grimacing;Guarding;Restless;Crying Pain Intervention(s): Repositioned  Frequency  Min 2X/week        Progress Toward Goals  OT Goals(current goals can now be found in the care plan section)     Acute Rehab OT Goals Patient Stated Goal: get home OT Goal Formulation: With patient Time For Goal Achievement: 02/19/21 Potential to Achieve Goals: Good ADL Goals Additional ADL Goal #1: Pt will demonstrate ability to perform sustained acitivity up to 10 min standing sink side with Min guard and less than two rest breaks to increase independence with ADL.  Plan Discharge plan remains appropriate    Co-evaluation                 AM-PAC OT "6 Clicks" Daily Activity     Outcome Measure   Help from another person eating meals?: Total Help from another person taking care of personal grooming?: A Little Help from another person toileting, which includes using toliet, bedpan, or urinal?: A Lot Help from another person bathing (including washing, rinsing, drying)?: A Lot Help from another person to put on and taking off regular upper body  clothing?: A Lot Help from another person to put on and taking off regular lower body clothing?: A Lot 6 Click Score: 12    End of Session Equipment Utilized During Treatment: Oxygen  OT Visit Diagnosis: Unsteadiness on feet (R26.81);Muscle weakness (generalized) (M62.81);Pain   Activity Tolerance Patient limited by pain   Patient Left in bed;with call bell/phone within reach;with bed alarm set;with nursing/sitter in room   Nurse Communication          Time: 3435-6861 OT Time Calculation (min): 16 min  Charges: OT General Charges $OT Visit: 1 Visit OT Treatments $Therapeutic Activity: 8-22 mins  02/05/2021  RP, OTR/L  Acute Rehabilitation Services  Office:  [phone]   Metta Clines 02/05/2021, 9:02 AM

## 2021-02-05 NOTE — Progress Notes (Signed)
Progress Note  31 Days Post-Op  Subjective: Patient vomited overnight and still reports abdominal pain. Explained that no surgical explanation for emesis or pain is seen on CTs. Patient is frustrated and tearful   Objective: Vital signs in last 24 hours: Temp:  [98 F (36.7 C)-98.9 F (37.2 C)] 98 F (36.7 C) (09/29 0800) Pulse Rate:  [97-102] 101 (09/29 0800) Resp:  [19-22] 22 (09/29 0800) BP: (139-165)/(74-93) 165/93 (09/29 0800) SpO2:  [100 %] 100 % (09/29 0800) FiO2 (%):  [28 %] 28 % (09/29 0810) Weight:  [98.5 kg] 98.5 kg (09/29 0500) Last BM Date: 02/05/21  Intake/Output from previous day: 09/28 0701 - 09/29 0700 In: 741.7 [I.V.:471.7; NG/GT:220; IV Piggyback:50] Out: 525 [Urine:450; Stool:75] Intake/Output this shift: No intake/output data recorded.  PE: General: WD, chronically ill appearing malewho is laying in bed and is tearful HEENT: trach present  Heart: regular, rate, and rhythm.   Lungs: junky sounding cough Abd: soft, mild diffuse tenderness that overall is not worse with palpation, no peritonitis or guarding, ND, +BS, stoma pink and viable with some liquid stool in ostomy pouch   Lab Results:  Recent Labs    02/03/21 0647  WBC 18.7*  HGB 8.5*  HCT 25.4*  PLT 381   BMET Recent Labs    02/04/21 0400 02/05/21 0244  NA 135 135  K 3.6 5.2*  CL 95* 98  CO2 25 21*  GLUCOSE 146* 168*  BUN 48* 62*  CREATININE 4.86* 6.42*  CALCIUM 9.3 9.5   PT/INR No results for input(s): LABPROT, INR in the last 72 hours. CMP     Component Value Date/Time   NA 135 02/05/2021 0244   K 5.2 (H) 02/05/2021 0244   CL 98 02/05/2021 0244   CO2 21 (L) 02/05/2021 0244   GLUCOSE 168 (H) 02/05/2021 0244   BUN 62 (H) 02/05/2021 0244   CREATININE 6.42 (H) 02/05/2021 0244   CREATININE 1.83 (H) 11/19/2020 0914   CALCIUM 9.5 02/05/2021 0244   PROT 7.9 01/29/2021 1233   ALBUMIN 3.1 (L) 02/05/2021 0244   AST 25 01/29/2021 1233   AST 12 (L) 11/19/2020 0914   ALT 31  01/29/2021 1233   ALT 19 11/19/2020 0914   ALKPHOS 116 01/29/2021 1233   BILITOT 0.6 01/29/2021 1233   BILITOT 0.7 11/19/2020 0914   GFRNONAA 9 (L) 02/05/2021 0244   GFRNONAA 41 (L) 11/19/2020 0914   GFRAA >60 04/01/2017 1921   Lipase     Component Value Date/Time   LIPASE 24 12/01/2020 0018       Studies/Results: CT ABDOMEN PELVIS WO CONTRAST  Result Date: 02/05/2021 CLINICAL DATA:  Left lower quadrant abdominal pain EXAM: CT ABDOMEN AND PELVIS WITHOUT CONTRAST TECHNIQUE: Multidetector CT imaging of the abdomen and pelvis was performed following the standard protocol without IV contrast. COMPARISON:  CT abdomen/pelvis dated 01/30/2021, 01/26/2021, and 12/27/2020. FINDINGS: Lower chest: Progressive patchy right lower lobe opacity, likely atelectasis, less likely pneumonia. Hepatobiliary: Liver is within normal limits. Gallbladder is unremarkable. No intrahepatic or extrahepatic ductal dilatation. Pancreas: Within normal limits. Spleen: Within normal limits. Adrenals/Urinary Tract: Adrenal glands are within normal limits. 4.0 cm lateral interpolar left renal cyst (series 3/image 38). Right kidney is within normal limits. No renal calculi or hydronephrosis. Bladder is decompressed by an indwelling Foley catheter. Stomach/Bowel: Stomach is within normal limits. Enteric tube terminates in the duodenal bulb. No evidence of bowel obstruction. Status post transverse colectomy with right mid abdominal ileostomy. Vascular/Lymphatic: No evidence of abdominal aortic  aneurysm. Atherosclerotic calcifications of the abdominal aorta and branch vessels. No suspicious abdominopelvic lymphadenopathy. Reproductive: Prostate is unremarkable. Other: No abdominopelvic ascites. Small fat containing right inguinal hernia. Moderate fat containing left inguinal hernia (series 3/image 90). Musculoskeletal: Mild degenerative changes at L3-4. IMPRESSION: Progressive patchy right lower lobe opacity, likely atelectasis, less  likely pneumonia. Additional stable ancillary and postsurgical findings from multiple recent prior CTs. Electronically Signed   By: Julian Hy M.D.   On: 02/05/2021 02:31   DG Abd 2 Views  Result Date: 02/04/2021 CLINICAL DATA:  Left lower quadrant abdominal pain. EXAM: ABDOMEN - 2 VIEW COMPARISON:  CT scan 01/30/2021 and abdominal radiograph from yesterday. FINDINGS: Stable mild eventration of the right hemidiaphragm with some overlying atelectasis. The feeding tube tip is in the antropyloric region of the stomach. Stable air-filled small bowel loops in the right abdomen but no significant distension. No free air. Right lower quadrant ostomy noted. IMPRESSION: Feeding tube tip is in the antropyloric region of the stomach. No significant bowel distention or free air. Electronically Signed   By: Marijo Sanes M.D.   On: 02/04/2021 12:59    Anti-infectives: Anti-infectives (From admission, onward)    Start     Dose/Rate Route Frequency Ordered Stop   01/29/21 1500  fluconazole (DIFLUCAN) IVPB 100 mg        100 mg 50 mL/hr over 60 Minutes Intravenous Every 24 hours 01/29/21 1316 02/03/21 0951   01/26/21 1630  Ampicillin-Sulbactam (UNASYN) 3 g in sodium chloride 0.9 % 100 mL IVPB  Status:  Discontinued        3 g 200 mL/hr over 30 Minutes Intravenous Every 12 hours 01/26/21 1534 01/29/21 1416   01/19/21 1600  cefTAZidime (FORTAZ) 1 g in sodium chloride 0.9 % 100 mL IVPB        1 g 200 mL/hr over 30 Minutes Intravenous  Once 01/19/21 1357 01/20/21 1126   01/17/21 1000  fluconazole (DIFLUCAN) tablet 50 mg       See Hyperspace for full Linked Orders Report.   50 mg Per Tube Daily 01/16/21 1752 01/22/21 1202   01/16/21 1845  fluconazole (DIFLUCAN) tablet 100 mg       See Hyperspace for full Linked Orders Report.   100 mg Per Tube  Once 01/16/21 1752 01/16/21 1816   01/15/21 1215  cefTAZidime (FORTAZ) 2 g in sodium chloride 0.9 % 100 mL IVPB  Status:  Discontinued        2 g 200 mL/hr over  30 Minutes Intravenous Every T-Th-Sa (Hemodialysis) 01/15/21 1207 01/25/21 1231   01/14/21 1130  cefTRIAXone (ROCEPHIN) 1 g in sodium chloride 0.9 % 100 mL IVPB  Status:  Discontinued        1 g 200 mL/hr over 30 Minutes Intravenous Every 24 hours 01/14/21 1030 01/15/21 1052   12/24/20 1730  ceFEPIme (MAXIPIME) 1 g in sodium chloride 0.9 % 100 mL IVPB        1 g 200 mL/hr over 30 Minutes Intravenous Every 24 hours 12/24/20 1642 12/29/20 0901   12/23/20 0845  cefTRIAXone (ROCEPHIN) 2 g in sodium chloride 0.9 % 100 mL IVPB  Status:  Discontinued        2 g 200 mL/hr over 30 Minutes Intravenous Every 24 hours 12/23/20 0800 12/24/20 1620   12/22/20 0600  ceFAZolin (ANCEF) IVPB 2g/100 mL premix        2 g 200 mL/hr over 30 Minutes Intravenous To Radiology 12/21/20 1503 12/22/20 1149   12/09/20 1800  vancomycin (VANCOREADY) IVPB 1750 mg/350 mL  Status:  Discontinued        1,750 mg 175 mL/hr over 120 Minutes Intravenous Every 24 hours 12/08/20 1357 12/09/20 1403   12/09/20 1700  anidulafungin (ERAXIS) 100 mg in sodium chloride 0.9 % 100 mL IVPB  Status:  Discontinued        100 mg 78 mL/hr over 100 Minutes Intravenous Every 24 hours 12/08/20 1359 12/09/20 1321   12/08/20 1500  vancomycin (VANCOCIN) 2,500 mg in sodium chloride 0.9 % 500 mL IVPB        2,500 mg 250 mL/hr over 120 Minutes Intravenous  Once 12/08/20 1357 12/08/20 1719   12/08/20 1500  anidulafungin (ERAXIS) 200 mg in sodium chloride 0.9 % 200 mL IVPB        200 mg 78 mL/hr over 200 Minutes Intravenous  Once 12/08/20 1359 12/08/20 1821   12/08/20 0000  ceFEPIme (MAXIPIME) 2 g in sodium chloride 0.9 % 100 mL IVPB  Status:  Discontinued        2 g 200 mL/hr over 30 Minutes Intravenous Every 12 hours 12/07/20 1200 12/14/20 0858   12/07/20 1000  metroNIDAZOLE (FLAGYL) IVPB 500 mg  Status:  Discontinued        500 mg 100 mL/hr over 60 Minutes Intravenous Every 12 hours 12/07/20 0849 12/14/20 0858   12/07/20 0900  ceFEPIme (MAXIPIME) 2  g in sodium chloride 0.9 % 100 mL IVPB  Status:  Discontinued        2 g 200 mL/hr over 30 Minutes Intravenous Every 24 hours 12/07/20 0809 12/07/20 1200   12/07/20 0830  metroNIDAZOLE (FLAGYL) IVPB 500 mg  Status:  Discontinued        500 mg 100 mL/hr over 60 Minutes Intravenous Every 8 hours 12/07/20 0758 12/07/20 0849   12/06/20 0900  Ampicillin-Sulbactam (UNASYN) 3 g in sodium chloride 0.9 % 100 mL IVPB  Status:  Discontinued        3 g 200 mL/hr over 30 Minutes Intravenous Every 12 hours 12/06/20 0832 12/07/20 0757   12/03/20 0800  ceFEPIme (MAXIPIME) 2 g in sodium chloride 0.9 % 100 mL IVPB  Status:  Discontinued        2 g 200 mL/hr over 30 Minutes Intravenous Every 8 hours 12/03/20 0746 12/04/20 1004   12/01/20 1000  ceFEPIme (MAXIPIME) 2 g in sodium chloride 0.9 % 100 mL IVPB  Status:  Discontinued        2 g 200 mL/hr over 30 Minutes Intravenous Every 12 hours 12/01/20 0513 12/03/20 0746   12/01/20 0300  vancomycin (VANCOREADY) IVPB 2000 mg/400 mL        2,000 mg 200 mL/hr over 120 Minutes Intravenous  Once 12/01/20 0255 12/01/20 0528   11/30/20 2245  ceFEPIme (MAXIPIME) 2 g in sodium chloride 0.9 % 100 mL IVPB        2 g 200 mL/hr over 30 Minutes Intravenous STAT 11/30/20 2237 12/01/20 0003        Assessment/Plan Stage IIIb colon cancer S/p open partial colectomy with transverse colostomy 09/10/20 Dr. Bobbye Morton Abdominal pain and decreased colostomy output Nausea and vomiting - POD 148 - new CT 9/23 without acute abnormality and KUB without significant findings - CT repeated overnight without any acute findings  - stoma without stricture and viable - this seems like more of a functional motility issue  - recommend optimizing electrolytes for bowel function - goal K >4.0 and Mg >2.0  - encourage mobilization as able -  recommend GI consult for motility vs possible UGI issue, nothing surgically to offer at this time. We will sign off but please call if we can be of further  assistance    FEN: NPO, Cortrak for TFs VTE: eliquis, SCDs ID: completed abx today    - below per primary team - Sepsis vs reaction to chemotherapy with shock - resolved Acute on chronic hypoxic respiratory failure/fungal PNA - on trach collar Fungal UTI AKI on CKD stage II - on HD A fib with RVR - on amio gtt Hx of MCA CVA Hx of asthma Hx of epilepsy on Keppra T2DM GERD HTN HLD OSA Obesity class II - BMI 39.76  LOS: 66 days    Norm Parcel, [facility] 02/05/2021, 9:58 AM Please see Amion for pager number during day hours 7:00am-4:30pm

## 2021-02-05 NOTE — Progress Notes (Signed)
Pt off the floor for CT

## 2021-02-05 NOTE — H&P (View-Only) (Signed)
 Referring Provider:  Dr. Pahwani, TRH Primary Care Physician:  Stowe, Shanna, NP Primary Gastroenterologist:  Unassigned, seen by Dr. Perry as inpatient 08/2020  Reason for Consultation:  Nausea, vomiting  HPI: Thomas [name] is a 65 y.o. male with a history of T2DM with neuropathy, HTN, HLD, colon cancer s/p resection and colostomy May 2022 s/p 3 cycles chemotherapy (last was July 2022)--followed by Dr. Sherrill.   Patient has been in the hospital for 66 days with a complicated course.  This is outlined in the hospitalist note as follows:  "Started CAPOX June 2022, reduced cepecitabine dose starting 11/19/2020 due to hand/foot syndrome, reduced oxaliplatin due to renal impairment and poor performance status. Presented to the ED 7/24 with abdominal pain found to be febrile, tachycardic, tachypneic. Lactic acid was 6.6 and persisted at 6.8 after sepsis IV fluid bolus and cefepime. He had ARF, demand ischemia, bandemia, was hyperglycemic with negative ketones, with lactic acidosis. He developed AFib with RVR in the ED, was started on diltiazem, and was admitted to the ICU for septic shock with unclear source. Sepsis was subsequently felt to be ruled out at time of admission, presentation felt more likely to be related to cepacitabine toxicity with oral mucositis, hand/foot syndrome, and diarrhea/high output colostomy.   He was transferred to hospitalist service 7/27 but developed metabolic encephalopathy with hyoxia due to aspiration pneumonia on 7/30 requiring intubation and pressor support. Failed extubation with inability to clear secretions, requiring reintubation 8/8, and tracheostomy subsequently placed 8/11. This was pulled out 8/22, replaced under bronch guidance, again removed 8/25 requiring reintubation after failed reinsertion. ENT revised tracheostomy in the OR 8/29. He has required IHD for ARF, and also had complications of RLE DVT now on eliquis, colitis thought to be ischemic 8/2 not  requiring specific intervention, Pseudomonas pneumonia having completed treatment, agitated encephalopathy which has improved, anemia requiring 4u PRBCs total, and thrombocytopenia which has resolved. On 9/4 he was transferred to hospitalist service having tolerated trach collar.  Patient was also evaluated by Dr. Sherrill during this admission clarified that patient was receiving curative chemotherapy prior to admission."  Patient has had ongoing complaints of mid to epigastric abdominal pain and nausea and vomiting.  He has had 3 CT scans of the September.  Surgery has evaluated the patient and found no need for surgical intervention.  Pantoprazole 40 mg IV twice daily and Pepcid 20 mg IV daily has just been started today.  He has been getting nystatin for thrush.  He has scopolamine patch, but Phenergan is only ordered as needed.  He is difficult to understand due to the trach but he says that the vomiting just wakes him up, occurs at night.  Had brown colored emesis overnight but none so far today.  He is visible frustrated and tearful.  Past Medical History:  Diagnosis Date   Acute ischemic left MCA stroke (HCC) 04/09/2014   Acute respiratory failure with hypoxia (HCC) 04/09/2014   Aspiration pneumonia (HCC)    Asthma    as a child   Brain tumor (HCC)    frontal   Cancer (HCC)    Confusion    occasionally   Diabetes mellitus without complication (HCC)    takes Metformin daily.   Dizziness    Dyspnea    pt states d/t weight   Epilepsy (HCC)    takes Keppra daily   GERD (gastroesophageal reflux disease)    takes Omeprazole daily   Headache    Hyperlipidemia      takes Atorvastatin daily   Hypertension    takes Lotrel daily   Peripheral edema    takes Lasix daily   Peripheral neuropathy    takes Gabapentin daily   Pneumonia 3 yrs ago   hx of   Seizures (HCC)    Sleep apnea    Urinary frequency     Past Surgical History:  Procedure Laterality Date   BIOPSY  09/05/2020    Procedure: BIOPSY;  Surgeon: Perry, John N, MD;  Location: [facility];  Service: Endoscopy;;   CARDIAC CATHETERIZATION  7 yrs ago   COLONOSCOPY WITH PROPOFOL N/A 09/05/2020   Procedure: COLONOSCOPY WITH PROPOFOL;  Surgeon: Perry, John N, MD;  Location: [facility];  Service: Endoscopy;  Laterality: N/A;   CRANIOTOMY N/A 09/01/2016   Procedure: CRANIOTOMY TUMOR  LEFT PTERIONAL;  Surgeon: Kyle Cabbell, MD;  Location: [facility] OR;  Service: Neurosurgery;  Laterality: N/A;   cyst removed from chest      as a child   IR FLUORO GUIDE CV LINE LEFT  12/22/2020   IR US GUIDE VASC ACCESS LEFT  12/22/2020   NO PAST SURGERIES     PARTIAL COLECTOMY N/A 09/10/2020   Procedure: TRANSVERSE COLECTOMY;  Surgeon: Lovick, Ayesha N, MD;  Location: [facility] OR;  Service: General;  Laterality: N/A;   POLYPECTOMY  09/05/2020   Procedure: POLYPECTOMY;  Surgeon: Perry, John N, MD;  Location: [facility];  Service: Endoscopy;;   PORTACATH PLACEMENT Right 10/01/2020   Procedure: INSERTION PORT-A-CATH;  Surgeon: Lovick, Ayesha N, MD;  Location: [facility];  Service: General;  Laterality: Right;   SUBMUCOSAL TATTOO INJECTION  09/05/2020   Procedure: SUBMUCOSAL TATTOO INJECTION;  Surgeon: Perry, John N, MD;  Location: [facility];  Service: Endoscopy;;   TRACHEOSTOMY TUBE PLACEMENT N/A 01/05/2021   Procedure: TRACHEOSTOMY;  Surgeon: Rosen, Jefry, MD;  Location: [facility];  Service: ENT;  Laterality: N/A;    Prior to Admission medications   Medication Sig Start Date End Date Taking? Authorizing Provider  acetaminophen (TYLENOL) 500 MG tablet Take 1,000 mg by mouth 3 (three) times daily as needed for headache (pain).   Yes [provider]  albuterol (PROVENTIL HFA;VENTOLIN HFA) 108 (90 BASE) MCG/ACT inhaler Inhale 2 puffs into the lungs every 4 (four) hours as needed for wheezing or shortness of breath. 04/09/15  Yes Muthersbaugh, Hannah, PA-C  amLODipine (NORVASC) 10 MG tablet Take 10 mg by mouth daily. 05/11/20  Yes [provider]  capecitabine (XELODA) 500 MG tablet Take 3 tablets (1,500 mg total) by mouth 2 (two) times daily after a meal. 11/19/20  Yes Sherrill, Gary B, MD  gabapentin (NEURONTIN) 300 MG capsule Take 1 capsule (300 mg total) by mouth 3 (three) times daily. 04/06/16  Yes Nanavati, Ankit, MD  levETIRAcetam (KEPPRA) 1000 MG tablet Take 1,000 mg by mouth 2 (two) times daily. 07/28/16  Yes [provider]  metFORMIN (GLUCOPHAGE-XR) 500 MG 24 hr tablet Take 500 mg by mouth 2 (two) times daily. 07/27/20  Yes [provider]  omeprazole (PRILOSEC) 20 MG capsule Take 20 mg by mouth daily. 08/10/16  Yes [provider]  atorvastatin (LIPITOR) 20 MG tablet Take 20 mg by mouth daily at 6 PM.    [provider]  lidocaine-prilocaine (EMLA) cream Apply 1 application topically as directed. Apply 1/2 tablespoon to port site 2 hours prior to stick and cover with plastic wrap to numb site Patient not taking: Reported on 10/08/2020 09/24/20   Sherrill, Gary B, MD  ondansetron (  ZOFRAN) 8 MG tablet Take 1 tablet (8 mg total) by mouth every 8 (eight) hours as needed for nausea or vomiting. Patient not taking: No sig reported 09/24/20   Sherrill, Gary B, MD  prochlorperazine (COMPAZINE) 10 MG tablet Take 1 tablet (10 mg total) by mouth every 6 (six) hours as needed. Patient not taking: No sig reported 09/24/20   Sherrill, Gary B, MD    Current Facility-Administered Medications  Medication Dose Route Frequency Provider Last Rate Last Admin   0.9 %  sodium chloride infusion   Intravenous PRN Jeong, Seong-Joo, MD   Stopped at 12/20/20 0955   acetaminophen (TYLENOL) 160 MG/5ML solution 650 mg  650 mg Oral Q6H PRN Shalhoub, George J, MD   650 mg at 02/02/21 2033   albuterol (PROVENTIL) (2.5 MG/3ML) 0.083% nebulizer solution 2.5 mg  2.5 mg Nebulization Q4H PRN Desai, Nikita S, MD   2.5 mg at 12/27/20 0559   anticoagulant sodium citrate solution 5 mL  5 mL Intracatheter Q T,Th,Sa-HD Olalere, Adewale A, MD    5 mL at 02/03/21 1133   chlorhexidine gluconate (MEDLINE KIT) (PERIDEX) 0.12 % solution 15 mL  15 mL Mouth Rinse BID Arrien, Mauricio Daniel, MD   15 mL at 02/05/21 0833   Chlorhexidine Gluconate Cloth 2 % PADS 6 each  6 each Topical Q0600 Webb, Martin, MD   6 each at 02/05/21 0622   dextrose 5 %-0.9 % sodium chloride infusion   Intravenous Continuous Ellis, Allison L, NP 75 mL/hr at 02/05/21 1000 Infusion Verify at 02/05/21 1000   dextrose 50 % solution 0-50 mL  0-50 mL Intravenous PRN Jeong, Seong-Joo, MD   50 mL at 01/01/21 1134   enoxaparin (LOVENOX) injection 90 mg  90 mg Subcutaneous Q24H Pahwani, Ravi, MD       famotidine (PEPCID) IVPB 20 mg in NS 100 mL IVPB  20 mg Intravenous Q24H Ellis, Allison L, NP       feeding supplement (NEPRO CARB STEADY) liquid 1,000 mL  1,000 mL Per Tube Q24H Arrien, Mauricio Daniel, MD   Held at 02/03/21 1947   feeding supplement (PROSource TF) liquid 45 mL  45 mL Per Tube BID Arrien, Mauricio Daniel, MD   45 mL at 02/04/21 2124   fentaNYL (SUBLIMAZE) injection 12.5 mcg  12.5 mcg Intravenous Q6H PRN Ellis, Allison L, NP   12.5 mcg at 02/05/21 0928   glycopyrrolate (ROBINUL) injection 0.1 mg  0.1 mg Intravenous Q4H PRN Anderson, Chelsey L, MD   0.1 mg at 02/01/21 2044   hydrocerin (EUCERIN) cream   Topical BID Sherrill, Gary B, MD   Given at 02/05/21 0936   insulin aspart (novoLOG) injection 0-15 Units  0-15 Units Subcutaneous Q4H Bowser, Grace E, NP   2 Units at 02/05/21 0833   insulin detemir (LEVEMIR) injection 10 Units  10 Units Subcutaneous Daily Arrien, Mauricio Daniel, MD   10 Units at 02/05/21 0941   levETIRAcetam (KEPPRA) IVPB 500 mg/100 mL premix  500 mg Intravenous Q12H Ellis, Allison L, NP   Stopped at 02/05/21 0947   lip balm (CARMEX) ointment   Topical PRN Sheikh, Omair Latif, DO       LORazepam (ATIVAN) injection 0.5 mg  0.5 mg Intravenous Q6H PRN Ellis, Allison L, NP   0.5 mg at 02/05/21 0928   MEDLINE mouth rinse  15 mL Mouth Rinse 10 times per  day Clark, Laura P, DO   15 mL at 02/05/21 0937   metoprolol tartrate (LOPRESSOR) injection 5 mg    5 mg Intravenous Q4H PRN Ellis, Allison L, NP       nystatin (MYCOSTATIN) 100000 UNIT/ML suspension 500,000 Units  5 mL Mouth/Throat QID Arrien, Mauricio Daniel, MD   500,000 Units at 02/05/21 0933   pantoprazole (PROTONIX) injection 40 mg  40 mg Intravenous Q12H Ellis, Allison L, NP   40 mg at 02/05/21 0929   promethazine (PHENERGAN) 12.5 mg in sodium chloride 0.9 % 50 mL IVPB  12.5 mg Intravenous Q6H PRN Chotiner, Bradley S, MD 200 mL/hr at 02/05/21 0416 12.5 mg at 02/05/21 0416   promethazine (PHENERGAN) 6.25 MG/5ML syrup 12.5 mg  12.5 mg Oral Q6H PRN Ellis, Allison L, NP   12.5 mg at 02/04/21 1707   scopolamine (TRANSDERM-SCOP) 1 MG/3DAYS 1.5 mg  1 patch Transdermal Q72H Ellison, Chi Jane, MD   1.5 mg at 02/04/21 1710   sodium chloride flush (NS) 0.9 % injection 10-40 mL  10-40 mL Intracatheter PRN Arrien, Mauricio Daniel, MD   10 mL at 01/31/21 0438    Allergies as of 11/30/2020   (No Known Allergies)    Family History  Problem Relation Age of Onset   Breast cancer Mother    Breast cancer Maternal Aunt    Prostate cancer Maternal Uncle     Social History   Socioeconomic History   Marital status: Married    Spouse name: Mahalia   Number of children: 4   Years of education: Not on file   Highest education level: Not on file  Occupational History   Occupation: Disabled  Tobacco Use   Smoking status: Former   Smokeless tobacco: Never   Tobacco comments:    quit smoking 35 yrs ago  Vaping Use   Vaping Use: Never used  Substance and Sexual Activity   Alcohol use: No   Drug use: No   Sexual activity: Not on file  Other Topics Concern   Not on file  Social History Narrative   Married to his wife, Mahalia with total of #4 children (#2 with current wife). Lives in home with his son and daughter-in-law.     Social Determinants of Health   Financial Resource Strain: Not on file   Food Insecurity: Not on file  Transportation Needs: Not on file  Physical Activity: Not on file  Stress: Not on file  Social Connections: Not on file  Intimate Partner Violence: Not on file    Review of Systems: ROS is O/W negative   Physical Exam: Vital signs in last 24 hours: Temp:  [98 F (36.7 C)-98.9 F (37.2 C)] 98 F (36.7 C) (09/29 0800) Pulse Rate:  [97-102] 101 (09/29 0800) Resp:  [19-22] 22 (09/29 0800) BP: (139-165)/(74-93) 165/93 (09/29 0800) SpO2:  [100 %] 100 % (09/29 0800) FiO2 (%):  [28 %] 28 % (09/29 0810) Weight:  [98.5 kg] 98.5 kg (09/29 0500) Last BM Date: 02/05/21 General:   Alert,  Well-developed, well-nourished, pleasant and cooperative in NAD Head:  Normocephalic and atraumatic. Eyes:  Sclera clear, no icterus.  Conjunctiva pink. Ears:  Normal auditory acuity. Mouth:  No deformity or lesions.   Lungs:  Clear throughout to auscultation.  No wheezes, crackles, or rhonchi.  Heart:  Regular rate and rhythm; no murmurs, clicks, rubs, or gallops. Abdomen:  Soft,nontender, BS active,nonpalp mass or hsm.   Rectal:  Deferred  Msk:  Symmetrical without gross deformities. Pulses:  Normal pulses noted. Extremities:  Without clubbing or edema. Neurologic:  Alert and oriented x4;  grossly normal neurologically. Skin:  Intact   without significant lesions or rashes. Psych:  Alert and cooperative. Normal mood and affect.  Intake/Output from previous day: 09/28 0701 - 09/29 0700 In: 741.7 [I.V.:471.7; NG/GT:220; IV Piggyback:50] Out: 525 [Urine:450; Stool:75] Intake/Output this shift: Total I/O In: 164.7 [I.V.:64.7; IV Piggyback:100] Out: -   Lab Results: Recent Labs    02/03/21 0647  WBC 18.7*  HGB 8.5*  HCT 25.4*  PLT 381   BMET Recent Labs    02/03/21 0647 02/04/21 0400 02/05/21 0244  NA 135 135 135  K 3.4* 3.6 5.2*  CL 98 95* 98  CO2 21* 25 21*  GLUCOSE 141* 146* 168*  BUN 79* 48* 62*  CREATININE 6.07* 4.86* 6.42*  CALCIUM 9.4 9.3 9.5    LFT Recent Labs    02/05/21 0244  ALBUMIN 3.1*   Studies/Results: CT ABDOMEN PELVIS WO CONTRAST  Result Date: 02/05/2021 CLINICAL DATA:  Left lower quadrant abdominal pain EXAM: CT ABDOMEN AND PELVIS WITHOUT CONTRAST TECHNIQUE: Multidetector CT imaging of the abdomen and pelvis was performed following the standard protocol without IV contrast. COMPARISON:  CT abdomen/pelvis dated 01/30/2021, 01/26/2021, and 12/27/2020. FINDINGS: Lower chest: Progressive patchy right lower lobe opacity, likely atelectasis, less likely pneumonia. Hepatobiliary: Liver is within normal limits. Gallbladder is unremarkable. No intrahepatic or extrahepatic ductal dilatation. Pancreas: Within normal limits. Spleen: Within normal limits. Adrenals/Urinary Tract: Adrenal glands are within normal limits. 4.0 cm lateral interpolar left renal cyst (series 3/image 38). Right kidney is within normal limits. No renal calculi or hydronephrosis. Bladder is decompressed by an indwelling Foley catheter. Stomach/Bowel: Stomach is within normal limits. Enteric tube terminates in the duodenal bulb. No evidence of bowel obstruction. Status post transverse colectomy with right mid abdominal ileostomy. Vascular/Lymphatic: No evidence of abdominal aortic aneurysm. Atherosclerotic calcifications of the abdominal aorta and branch vessels. No suspicious abdominopelvic lymphadenopathy. Reproductive: Prostate is unremarkable. Other: No abdominopelvic ascites. Small fat containing right inguinal hernia. Moderate fat containing left inguinal hernia (series 3/image 90). Musculoskeletal: Mild degenerative changes at L3-4. IMPRESSION: Progressive patchy right lower lobe opacity, likely atelectasis, less likely pneumonia. Additional stable ancillary and postsurgical findings from multiple recent prior CTs. Electronically Signed   By: Sriyesh  Krishnan M.D.   On: 02/05/2021 02:31   DG Abd 2 Views  Result Date: 02/04/2021 CLINICAL DATA:  Left lower  quadrant abdominal pain. EXAM: ABDOMEN - 2 VIEW COMPARISON:  CT scan 01/30/2021 and abdominal radiograph from yesterday. FINDINGS: Stable mild eventration of the right hemidiaphragm with some overlying atelectasis. The feeding tube tip is in the antropyloric region of the stomach. Stable air-filled small bowel loops in the right abdomen but no significant distension. No free air. Right lower quadrant ostomy noted. IMPRESSION: Feeding tube tip is in the antropyloric region of the stomach. No significant bowel distention or free air. Electronically Signed   By: P.  Gallerani M.D.   On: 02/04/2021 12:59   DG Abd Portable 1V  Result Date: 02/05/2021 CLINICAL DATA:  Ileus EXAM: PORTABLE ABDOMEN - 1 VIEW COMPARISON:  Abdominal radiographs 02/04/2021, CT abdomen/pelvis 02/05/2021 FINDINGS: Enteric catheter is in place with the tip projecting over the expected location of the pylorus/first portion of the duodenum. There is a relative paucity of bowel gas throughout the abdomen with a single gas distended loop of bowel in the right hemiabdomen measuring up to 3.2 cm, nonspecific. There is no evidence of mechanical obstruction. The bones are unremarkable. IMPRESSION: 1. Relative paucity of bowel gas throughout the abdomen without evidence of mechanical obstruction. 2. Enteric catheter tip at the   expected location of the pylorus/first portion of the duodenum. Electronically Signed   By: Peter  Noone M.D.   On: 02/05/2021 10:02    IMPRESSION:  *Nausea and vomiting with abdominal pain and mid to upper abdomen:  No obvious issues by CT scan x 2.  Surgery thinks more of a functional motility issue.  He is certainly at risk for ulcer disease given all the stress that his body has been under with recent events.  He has not been on any type of PPI therapy until it was initiated today.  His nausea and vomiting could certainly multifactorial, but will rule out ulcer disease, esophagitis, and other intraluminal  abnormalities. *Stage 3b colon cancer s/p open partial colectomy with transverse colostomy 09/10/20 with Dr. Lovick *Sepsis vs reaction to chemotherapy with shock - resolved *Acute on chronic hypoxic respiratory failure/fungal PNA - on trach collar *Fungal UTI:  Completed treatment with diflucan on 9/27. 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 *Hx of MCA CVA *Hx of epilepsy on Keppra *T2DM  PLAN: -We will plan for EGD on 9/30 tentatively 9 AM with Dr. Pyrtle -Agree with starting pantoprazole 40 mg IV twice daily and Pepcid 20 mg IV daily. -I am going to schedule Zofran 4 mg IV given given every 6 hours around-the-clock and Phenergan can be used for breakthrough. -Continue nystatin suspension for now.   Isel Skufca D. Jayana Kotula  02/05/2021, 10:43 AM      

## 2021-02-06 ENCOUNTER — Encounter: Payer: Self-pay | Admitting: Oncology

## 2021-02-06 ENCOUNTER — Inpatient Hospital Stay (HOSPITAL_COMMUNITY): Payer: Medicare HMO

## 2021-02-06 ENCOUNTER — Inpatient Hospital Stay (HOSPITAL_COMMUNITY): Payer: Medicare HMO | Admitting: Anesthesiology

## 2021-02-06 ENCOUNTER — Encounter (HOSPITAL_COMMUNITY): Payer: Self-pay | Admitting: Pulmonary Disease

## 2021-02-06 ENCOUNTER — Encounter (HOSPITAL_COMMUNITY)
Admission: EM | Disposition: A | Discharge status: Home / Self Care | Payer: Self-pay | Source: Home / Self Care | Attending: Family Medicine

## 2021-02-06 DIAGNOSIS — K269 Duodenal ulcer, unspecified as acute or chronic, without hemorrhage or perforation: Secondary | ICD-10-CM | POA: Diagnosis not present

## 2021-02-06 DIAGNOSIS — E1100 Type 2 diabetes mellitus with hyperosmolarity without nonketotic hyperglycemic-hyperosmolar coma (NKHHC): Secondary | ICD-10-CM | POA: Diagnosis not present

## 2021-02-06 DIAGNOSIS — K2100 Gastro-esophageal reflux disease with esophagitis, without bleeding: Secondary | ICD-10-CM | POA: Diagnosis not present

## 2021-02-06 DIAGNOSIS — K209 Esophagitis, unspecified without bleeding: Secondary | ICD-10-CM

## 2021-02-06 DIAGNOSIS — I6340 Cerebral infarction due to embolism of unspecified cerebral artery: Secondary | ICD-10-CM | POA: Insufficient documentation

## 2021-02-06 HISTORY — PX: ESOPHAGOGASTRODUODENOSCOPY (EGD) WITH PROPOFOL: SHX5813

## 2021-02-06 HISTORY — PX: BIOPSY: SHX5522

## 2021-02-06 LAB — RENAL FUNCTION PANEL
Albumin: 2.6 g/dL — ABNORMAL LOW (ref 3.5–5.0)
Anion gap: 14 (ref 5–15)
BUN: 72 mg/dL — ABNORMAL HIGH (ref 8–23)
CO2: 20 mmol/L — ABNORMAL LOW (ref 22–32)
Calcium: 9 mg/dL (ref 8.9–10.3)
Chloride: 105 mmol/L (ref 98–111)
Creatinine, Ser: 6.71 mg/dL — ABNORMAL HIGH (ref 0.61–1.24)
GFR, Estimated: 9 mL/min — ABNORMAL LOW (ref >60)
Glucose, Bld: 133 mg/dL — ABNORMAL HIGH (ref 70–99)
Phosphorus: 6.3 mg/dL — ABNORMAL HIGH (ref 2.5–4.6)
Potassium: 4.3 mmol/L (ref 3.5–5.1)
Sodium: 139 mmol/L (ref 135–145)

## 2021-02-06 LAB — LIPID PANEL
Cholesterol: 115 mg/dL (ref 0–200)
HDL: 28 mg/dL — ABNORMAL LOW (ref >40)
LDL Cholesterol: 67 mg/dL (ref 0–99)
Total CHOL/HDL Ratio: 4.1 RATIO
Triglycerides: 100 mg/dL (ref <150)
VLDL: 20 mg/dL (ref 0–40)

## 2021-02-06 LAB — HEMOGLOBIN A1C
Hgb A1c MFr Bld: 6.4 % — ABNORMAL HIGH (ref 4.8–5.6)
Mean Plasma Glucose: 136.98 mg/dL

## 2021-02-06 LAB — GLUCOSE, CAPILLARY
Glucose-Capillary: 125 mg/dL — ABNORMAL HIGH (ref 70–99)
Glucose-Capillary: 119 mg/dL — ABNORMAL HIGH (ref 70–99)
Glucose-Capillary: 117 mg/dL — ABNORMAL HIGH (ref 70–99)
Glucose-Capillary: 121 mg/dL — ABNORMAL HIGH (ref 70–99)
Glucose-Capillary: 109 mg/dL — ABNORMAL HIGH (ref 70–99)

## 2021-02-06 LAB — MAGNESIUM: Magnesium: 1.9 mg/dL (ref 1.7–2.4)

## 2021-02-06 SURGERY — ESOPHAGOGASTRODUODENOSCOPY (EGD) WITH PROPOFOL
Anesthesia: Monitor Anesthesia Care

## 2021-02-06 MED ORDER — NEPRO/CARBSTEADY PO LIQD
1000.0000 mL | ORAL | Status: DC
  Administered 2021-02-07 – 2021-02-08 (×2): 1000 mL
  Filled 2021-02-06 (×7): qty 1000

## 2021-02-06 MED ORDER — FAMOTIDINE IN NACL 20-0.9 MG/50ML-% IV SOLN
20.0000 mg | INTRAVENOUS | Status: DC
  Administered 2021-02-06 – 2021-02-08 (×3): 20 mg via INTRAVENOUS
  Filled 2021-02-06 (×4): qty 50

## 2021-02-06 MED ORDER — ONDANSETRON HCL 4 MG/2ML IJ SOLN
INTRAMUSCULAR | Status: DC | PRN
  Administered 2021-02-06: 4 mg via INTRAVENOUS

## 2021-02-06 MED ORDER — SODIUM CHLORIDE 0.9 % IV SOLN: INTRAVENOUS | Status: DC | PRN

## 2021-02-06 MED ORDER — LORAZEPAM 2 MG/ML IJ SOLN
1.0000 mg | Freq: Once | INTRAMUSCULAR | Status: AC
  Administered 2021-02-06: 1 mg via INTRAVENOUS
  Filled 2021-02-06: qty 1

## 2021-02-06 MED ORDER — GADOBUTROL 1 MMOL/ML IV SOLN
8.0000 mL | Freq: Once | INTRAVENOUS | Status: AC | PRN
  Administered 2021-02-06: 8 mL via INTRAVENOUS

## 2021-02-06 MED ORDER — PROPOFOL 10 MG/ML IV BOLUS
INTRAVENOUS | Status: DC | PRN
  Administered 2021-02-06: 20 mg via INTRAVENOUS

## 2021-02-06 MED ORDER — PROPOFOL 500 MG/50ML IV EMUL
INTRAVENOUS | Status: DC | PRN
  Administered 2021-02-06: 125 ug/kg/min via INTRAVENOUS

## 2021-02-06 SURGICAL SUPPLY — 15 items

## 2021-02-06 NOTE — Progress Notes (Signed)
Patient refused to have tube feeding restarted at this lower rate this afternoon he wants to wait until the am to restart feeds.

## 2021-02-06 NOTE — Progress Notes (Signed)
[facility] Progress Note   65 year old gentleman with a history of colon cancer status post resection and colostomy May 2022.  Status post 3 cycles of chemotherapy last was in July 2022.  Type 2 diabetes with neuropathy hypertension hyperlipidemia presented to the emergency room 11/30/2020 with abdominal pain.  Was found to be secondary to sepsis.  Patient also was found to have acute kidney injury.  Developed atrial fibrillation with rapid ventricular rate and was admitted to intensive care unit for septic shock.  On 12/03/2020 he developed metabolic encephalopathy with hypoxia and aspiration pneumonia and required intubation 12/06/2020.  He also required pressor support.  He was reintubated since 12/15/2020 tracheostomy placed.  His hospital course  complicated by DVT.  It appears that he has recurrent Pseudomonas pneumonia.  He also has recurrent anemia requiring transfusion of 4 units of packed red blood cells.  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 CRRT initiated 7/31 and transitioned to IHD on 12/19/20.  S/p LIJ TDC placement 12/22/20.  We will closely monitor for signs of recovery but urine output has been fairly minimal  Planned on dialysis today but the patient is fairly tired since undergoing procedure this morning.  We will rearrange dialysis for tomorrow morning.    2.Urinary retention   Foley catheter placed     3.Volume overload/anasarca-improved with dialysis.   4.Sepsis/aspiration pneumonia/VDRF:aspiration pneumonia.  Tracheostomy placed   4.Stage IIIB colon cancer - sp partial colectomy, colostomy. On chemoRx sp 3 cycles.   5.Right leg DVT: On Eliquis.   6.Atrial fibrillation - on amiodarone and anticoagulation.     7.Anemia of critical illness: DC'ed  IV iron as concern for infection and on abx, transfuse as needed, holding erythropoietin with active malignancy.   Monitor hemoglobin and transfuse as needed.   8. CKD-MBD; started sevelamer for hyperphosphatemia on 9/8, monitor phosphorus level.--improved 4.3-6.3   9. Disposition - poor overall prognosis.  Seen by palliative care team.  Discussion ongoing for possible transfer to [facility].  The tracheostomy should be capped before he can be accepted to [facility]  Subjective:   Patient underwent EGD this morning.  Tolerated it well.  Awaiting results.  Pretty tired today and hoping to change dialysis to tomorrow.   Objective:   BP (!) 158/66   Pulse 93   Temp 97.6 F (36.4 C) (Tympanic)   Resp 20   Ht _0  (1.727 m)   Wt 100.1 kg   SpO2 100%   BMI 33.55 kg/m   Intake/Output Summary (Last 24 hours) at 02/06/2021 1150 Last data filed at 02/06/2021 0944 Gross per 24 hour  Intake 1696.06 ml  Output 750 ml  Net 946.06 ml   Weight change: 1.6 kg  Physical Exam: General: Chronically ill-appearing, lying in bed, no distress Heart: Normal rate, no audible murmur Lungs: normal wob, bilateral chest rise Abdomen:soft, Non-tender, non-distended Extremities: Minimal edema, warm and well perfused Neurology: Alert awake and following commands  Dialysis Access: LIJ TDC in place.     Imaging: CT ABDOMEN PELVIS WO CONTRAST  Result Date: 02/05/2021 CLINICAL DATA:  Left lower quadrant abdominal pain EXAM: CT ABDOMEN AND PELVIS WITHOUT CONTRAST TECHNIQUE: Multidetector CT imaging of the abdomen and pelvis was performed following the standard protocol without IV contrast. COMPARISON:  CT abdomen/pelvis dated 01/30/2021, 01/26/2021, and 12/27/2020. FINDINGS: Lower chest: Progressive patchy right lower lobe opacity, likely atelectasis, less likely pneumonia. Hepatobiliary: Liver  is within normal limits. Gallbladder is unremarkable. No intrahepatic or extrahepatic ductal dilatation. Pancreas: Within normal limits. Spleen: Within normal limits. Adrenals/Urinary Tract: Adrenal glands are within normal limits. 4.0  cm lateral interpolar left renal cyst (series 3/image 38). Right kidney is within normal limits. No renal calculi or hydronephrosis. Bladder is decompressed by an indwelling Foley catheter. Stomach/Bowel: Stomach is within normal limits. Enteric tube terminates in the duodenal bulb. No evidence of bowel obstruction. Status post transverse colectomy with right mid abdominal ileostomy. Vascular/Lymphatic: No evidence of abdominal aortic aneurysm. Atherosclerotic calcifications of the abdominal aorta and branch vessels. No suspicious abdominopelvic lymphadenopathy. Reproductive: Prostate is unremarkable. Other: No abdominopelvic ascites. Small fat containing right inguinal hernia. Moderate fat containing left inguinal hernia (series 3/image 90). Musculoskeletal: Mild degenerative changes at L3-4. IMPRESSION: Progressive patchy right lower lobe opacity, likely atelectasis, less likely pneumonia. Additional stable ancillary and postsurgical findings from multiple recent prior CTs. Electronically Signed   By: Julian Hy M.D.   On: 02/05/2021 02:31   MR BRAIN WO CONTRAST  Result Date: 02/05/2021 CLINICAL DATA:  Neuro deficit, acute, stroke suspected EXAM: MRI HEAD WITHOUT CONTRAST TECHNIQUE: Multiplanar, multiecho pulse sequences of the brain and surrounding structures were obtained without intravenous contrast. COMPARISON:  CT 12/05/2020.  MRI 04/10/2014. FINDINGS: Brain: Multiple punctate foci of restricted diffusion in bilateral frontal white matter (series 5, image 89). Mild associated edema without mass effect. Chronic encephalomalacia in the left frontal lobe secondary to resection of an extra-axial mass (see MRI from 04/10/2014). Surrounding T2 hyperintensity, nonspecific but compatible with gliosis. No mass effect. The absence of contrast precludes evaluation for enhancement. 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 No hydrocephalus, midline shift, acute hemorrhage, or extra-axial fluid collection.  Generalized atrophy. Vascular: Major arterial flow voids are maintained at the skull base. Skull and upper cervical spine: Normal marrow signal. Sinuses/Orbits: Mild paranasal sinus mucosal thickening. No acute orbital findings. Other: Moderate bilateral mastoid effusions. IMPRESSION: 1. Multiple punctate foci of restricted diffusion in bilateral frontal white matter, most likely acute infarcts. Given the patient's known history of colon cancer, consider postcontrast imaging to ensure these areas do not enhance and exclude metastatic disease. 2. Postsurgical left frontal encephalomalacia and craniotomy, described above. 3. Moderate bilateral mastoid effusions. Electronically Signed   By: Margaretha Sheffield M.D.   On: 02/05/2021 20:00   DG Abd 2 Views  Result Date: 02/04/2021 CLINICAL DATA:  Left lower quadrant abdominal pain. EXAM: ABDOMEN - 2 VIEW COMPARISON:  CT scan 01/30/2021 and abdominal radiograph from yesterday. FINDINGS: Stable mild eventration of the right hemidiaphragm with some overlying atelectasis. The feeding tube tip is in the antropyloric region of the stomach. Stable air-filled small bowel loops in the right abdomen but no significant distension. No free air. Right lower quadrant ostomy noted. IMPRESSION: Feeding tube tip is in the antropyloric region of the stomach. No significant bowel distention or free air. Electronically Signed   By: Marijo Sanes M.D.   On: 02/04/2021 12:59   DG Abd Portable 1V  Result Date: 02/05/2021 CLINICAL DATA:  Ileus EXAM: PORTABLE ABDOMEN - 1 VIEW COMPARISON:  Abdominal radiographs 02/04/2021, CT abdomen/pelvis 02/05/2021 FINDINGS: Enteric catheter is in place with the tip projecting over the expected location of the pylorus/first portion of the duodenum. There is a relative paucity of bowel gas throughout the abdomen with a single gas distended loop of bowel in the right hemiabdomen measuring up to 3.2 cm, nonspecific. There is no evidence of mechanical  obstruction. The bones are unremarkable. IMPRESSION:  1. Relative paucity of bowel gas throughout the abdomen without evidence of mechanical obstruction. 2. Enteric catheter tip at the expected location of the pylorus/first portion of the duodenum. Electronically Signed   By: Valetta Mole M.D.   On: 02/05/2021 10:02    Labs: BMET Recent Labs  Lab 02/01/21 0410 02/02/21 0520 02/03/21 0647 02/04/21 0400 02/05/21 0244 02/05/21 2159 02/06/21 0434  NA 134* 136 135 135 135 136 139  K 3.7 3.7 3.4* 3.6 5.2* 4.2 4.3  CL 96* 97* 98 95* 98 103 105  CO2 23 23 21* 25 21* 19* 20*  GLUCOSE 195* 124* 141* 146* 168* 185* 133*  BUN 39* 59* 79* 48* 62* 72* 72*  CREATININE 4.05* 5.24* 6.07* 4.86* 6.42* 6.84* 6.71*  CALCIUM 9.3 9.6 9.4 9.3 9.5 9.0 9.0  PHOS 4.3 6.3* 6.1* 5.0* 5.2* 5.8* 6.3*   CBC Recent Labs  Lab 01/30/21 1937 01/31/21 0814 02/03/21 0647  WBC 23.9* 19.1* 18.7*  NEUTROABS 16.9*  --   --   HGB 9.2* 8.5* 8.5*  HCT 26.7* 25.5* 25.4*  MCV 84.5 86.1 85.5  PLT 381 324 381    Medications:     chlorhexidine gluconate (MEDLINE KIT)  15 mL Mouth Rinse BID   Chlorhexidine Gluconate Cloth  6 each Topical Q0600   enoxaparin (LOVENOX) injection  90 mg Subcutaneous Q24H   famotidine (PEPCID) IV  20 mg Intravenous Q24H   feeding supplement (NEPRO CARB STEADY)  1,000 mL Per Tube Q24H   feeding supplement (PROSource TF)  45 mL Per Tube BID   hydrocerin   Topical BID   insulin aspart  0-15 Units Subcutaneous Q4H   insulin detemir  10 Units Subcutaneous Daily   mouth rinse  15 mL Mouth Rinse 10 times per day   nystatin  5 mL Mouth/Throat QID   ondansetron  4 mg Intravenous Q6H   pantoprazole (PROTONIX) IV  40 mg Intravenous Q12H   scopolamine  1 patch Transdermal Q72H     Shaune Pollack Jes Costales  02/06/2021, 11:50 AM

## 2021-02-06 NOTE — Anesthesia Postprocedure Evaluation (Signed)
Anesthesia Post Note  Patient: Thomas [name]  Procedure(s) Performed: ESOPHAGOGASTRODUODENOSCOPY (EGD) WITH PROPOFOL BIOPSY     Patient location during evaluation: PACU Anesthesia Type: MAC Level of consciousness: awake and alert Pain management: pain level controlled Vital Signs Assessment: post-procedure vital signs reviewed and stable Respiratory status: spontaneous breathing, nonlabored ventilation, respiratory function stable and patient connected to nasal cannula oxygen Cardiovascular status: stable and blood pressure returned to baseline Postop Assessment: no apparent nausea or vomiting Anesthetic complications: no   No notable events documented.  Last Vitals:  Vitals:   02/06/21 0956 02/06/21 1006  BP: (!) 147/38 (!) 158/66  Pulse: 89 94  Resp:    Temp:    SpO2: 100% 100%    Last Pain:  Vitals:   02/06/21 1006  TempSrc:   PainSc: 0-No pain                 Tiajuana Amass

## 2021-02-06 NOTE — Interval H&P Note (Signed)
History and Physical Interval Note: For egd today to eval epig pain, n/v Bit better today with scheduled zofran but also TFs have been off Dix.The nature of the procedure, as well as the risks, benefits, and alternatives were carefully and thoroughly reviewed with the patient. Ample time for discussion and questions allowed. The patient understood, was satisfied, and agreed to proceed.   02/06/2021 9:13 AM  Thomas [name]  has presented today for surgery, with the diagnosis of Nausea, vomiting, epigastric pain.  The various methods of treatment have been discussed with the patient and family. After consideration of risks, benefits and other options for treatment, the patient has consented to  Procedure(s): ESOPHAGOGASTRODUODENOSCOPY (EGD) WITH PROPOFOL (N/A) as a surgical intervention.  The patient's history has been reviewed, patient examined, no change in status, stable for surgery.  I have reviewed the patient's chart and labs.  Questions were answered to the patient's satisfaction.     [name]

## 2021-02-06 NOTE — Op Note (Signed)
[facility] Patient Name: Thomas [name] Procedure Date : 02/06/2021 MRN: [id number] Attending MD: Jerene Bears , MD Date of Birth: October 12, 1955 CSN: [id number] Age: 65 Admit Type: Inpatient Procedure:                Upper GI endoscopy Indications:              Epigastric abdominal pain, Nausea with vomiting Providers:                Lajuan Lines. Hilarie Fredrickson, MD, Grace Isaac, RN, Fransico Setters                            Mbumina, Technician Referring MD:             [facility] Medicines:                Monitored Anesthesia Care Complications:            No immediate complications. Estimated Blood Loss:     Estimated blood loss was minimal. Procedure:                Pre-Anesthesia Assessment:                           - Prior to the procedure, a History and Physical                            was performed, and patient medications and                            allergies were reviewed. The patient's tolerance of                            previous anesthesia was also reviewed. The risks                            and benefits of the procedure and the sedation                            options and risks were discussed with the patient.                            All questions were answered, and informed consent                            was obtained. Prior Anticoagulants: The patient has                            taken no previous anticoagulant or antiplatelet                            agents. ASA Grade Assessment: IV - A patient with                            severe systemic disease that is a constant threat  to life. After reviewing the risks and benefits,                            the patient was deemed in satisfactory condition to                            undergo the procedure.                           After obtaining informed consent, the endoscope was                            passed under direct vision. Throughout the                             procedure, the patient's blood pressure, pulse, and                            oxygen saturations were monitored continuously. The                            [id number]-H190 ([id number]) Olympus endoscope was introduced                            through the mouth, and advanced to the second part                            of duodenum. The upper GI endoscopy was                            accomplished without difficulty. The patient                            tolerated the procedure well. Scope In: Scope Out: Findings:      A nasoenteric tube was found in the esophagus and stomach with the tip       located in the antrum.      LA Grade C (one or more mucosal breaks continuous between tops of 2 or       more mucosal folds, less than 75% circumference) esophagitis with no       bleeding was found in the lower third of the esophagus.      The entire examined stomach was normal. Biopsies were taken with a cold       forceps for histology and Helicobacter pylori testing give duodenal       ulcer disease. No evidence for gastric outlet obstruction as the pylorus       is widely patent.      Few non-bleeding cratered duodenal ulcers with no stigmata of bleeding       were found in the duodenal bulb. The largest lesion was 6 mm in largest       dimension.      The examined second portion of the duodenum was normal. Impression:               - A nasoenteric tube was found in the esophagus and  stomach. Some proximal migration of the tube as                            endoscope was withdrawn (initially curled in mouth                            but was able to be uncoiled and advanced distally.                            Relook with endoscope and tube did not appear to be                            curled in the oropharynx).                           - LA Grade C reflux esophagitis with no bleeding.                           - Normal stomach. Biopsied given duodenal ulcer                             disease.                           - Non-bleeding duodenal ulcers with no stigmata of                            bleeding. Possible source of epigastric pain.                           - Normal second portion of the duodenum.                           - No evidence of gastric or duodenal outlet                            obstruction. Moderate Sedation:      N/A Recommendation:           - Return patient to hospital ward for observation.                           - Advance diet as tolerated. Consider 1 view abd                            x-ray to ensure nasoenteric tube is in the distal                            stomach and then okay to resume TFs.                           - Continue present medications. PPI started                            yesterday and I would  continue BID IV (PO okay when                            able) x 4 weeks and then daily thereafter. Would                            continue with scheduled ondansetron. If refractory                            nausea could consider low-dose metoclopramide in                            addition to ondansetron.                           - Await pathology results to exclude H. Pylori.                           - GI will remain available if needed, please call                            with questions. Procedure Code(s):        --- Professional ---                           [phone], Esophagogastroduodenoscopy, flexible,                            transoral; with biopsy, single or multiple Diagnosis Code(s):        --- Professional ---                           [phone], Other foreign object in esophagus causing                            other injury, initial encounter                           K21.00, Gastro-esophageal reflux disease with                            esophagitis, without bleeding                           K26.9, Duodenal ulcer, unspecified as acute or                            chronic,  without hemorrhage or perforation                           R10.13, Epigastric pain                           R11.2, Nausea with vomiting, unspecified CPT copyright 2019 [employer] Association. All rights reserved. The codes documented in this report are preliminary and upon coder review may  be revised to meet  current compliance requirements. Jerene Bears, MD 02/06/2021 9:52:41 AM This report has been signed electronically. Number of Addenda: 0

## 2021-02-06 NOTE — Transfer of Care (Signed)
Immediate Anesthesia Transfer of Care Note  Patient: Thomas [name]  Procedure(s) Performed: ESOPHAGOGASTRODUODENOSCOPY (EGD) WITH PROPOFOL BIOPSY  Patient Location: Endoscopy Unit  Anesthesia Type:MAC  Level of Consciousness: drowsy and patient cooperative  Airway & Oxygen Therapy: Patient Spontanous Breathing and Patient connected to tracheostomy mask oxygen  Post-op Assessment: Report given to RN and Post -op Vital signs reviewed and stable  Post vital signs: Reviewed and stable  Last Vitals:  Vitals Value Taken Time  BP 146/70 02/06/21 0947  Temp    Pulse 90 02/06/21 0952  Resp    SpO2 100 % 02/06/21 0952  Vitals shown include unvalidated device data.  Last Pain:  Vitals:   02/06/21 0811  TempSrc: Temporal  PainSc:       Patients Stated Pain Goal: 2 (50/15/86 8257)  Complications: No notable events documented.

## 2021-02-06 NOTE — Progress Notes (Signed)
BFR decreased due to high arterial pressures.

## 2021-02-06 NOTE — Progress Notes (Signed)
Nutrition Follow-up  DOCUMENTATION CODES:  Non-severe (moderate) malnutrition in context of chronic illness, Obesity unspecified  INTERVENTION:  Continue TF via Cortrak: -Nepro 1.8 @ goal of 14ml/hr (1310ml/d) -81ml Prosource TF BID   Provides 2456 kcals, 128 grams protein, 926ml free water   If pt does not tolerate, recommend advancing Cortrak postpyloric. If pt cannot have diet advanced safely, consider placement of long-term feeding tube like PEG, possibly with J-port if N/V continues.   NUTRITION DIAGNOSIS:  Moderate Malnutrition related to chronic illness, cancer and cancer related treatments as evidenced by mild fat depletion, mild muscle depletion, percent weight loss. - ongoing  GOAL:  Patient will meet greater than or equal to 90% of their needs - addressing with TF  MONITOR:  Diet advancement, Labs, Weight trends, TF tolerance, I & O's, Skin  REASON FOR ASSESSMENT:  Consult Enteral/tube feeding initiation and management  ASSESSMENT:  65 y.o. male who is a former with medical history of stage 3 colon cancer s/p partial colectomy and end colostomy on 5/4, recent chemo start with 3rd cycle on 7/22, sleep apnea, seizures, epilepsy, brain tumor, HTN, HLD, GERD, asthma, peripheral neuropathy, urinary frequency, DM, MCA stroke. He presented to the ED via EMS due to abdominal pain x2 weeks, anorexia, and weakness. Admitted for close monitoring for concern of mesenteric ischemia. Patient reported that abdominal pain was worse with eating PTA.  7/31 - CRRT initiated 8/09 - CRRT stopped  8/10 - transfer to [facility] for trach placement and iHD 8/11 - s/p trach 8/12 - Cortrak placed (tip of tube in stomach) 8/15 - s/p placement of [facility] 8/22 - pulled trach out, replaced under bronch guidance, s/p bronch revealing mild area of granulation tissue of top of trach tube 8/25 - pulled trach out, transferred to ICU, emergently intubated 8/29 - trach replaced with ENT in OR 9/04 - tx out of  ICU 9/09 - changed to cuffless trach 9/22 - diet advanced to clears 9/23 - made NPO  9/27 - TF held 2/2 emesis  Pt remains on trach collar. Pt underwent EGD this morning which revealed grade LA C reflux esophagitis/non-bleeding duodenal ulcers. H. Pylori pending. Per MD, pt tolerated procedure well. Per RN, pt now heading down to MRI for imaging w/ gadolinium and will then have HD following MRI. Per NP, plan is to advance pt's diet as tolerated and to restart TF at 37ml/hr today with plans to advance back to goal of 69ml/hr after 24 hours if pt tolerates trickles. If pt does not tolerate, recommend advancing Cortrak postpyloric. SLP following with plans for FEES. If pt cannot have diet advanced, consider placement of long-term feeding tube like PEG, possibly with J-port if N/V continues.   Last HD 9/27 w/ 951mL net UF.   PMT following. Discussions ongoing for possible transfer to [facility]. Pt's trach should be capped before he can be accepted to [facility].   Current TF Regimen: Nepro 1.8 @ 22ml/hr (1336ml/d) w/ 105ml Prosource TF BID  Admit wt: 111.8 kg Current wt: 100.1 kg  Medications: pepcid, SSI, Levemir, Zofran, Protonix, Phenergan IVF: D5-NS @ 90ml/hr Labs: BUN 72 (H), Cr 6.71 (H), PO4 6.3 (H) CBGs 119-183 x24 hours  UOP: 367ml x24 hours Colostomy: 452ml x24 hours  Diet Order:   Diet Order     None      EDUCATION NEEDS:  No education needs have been identified at this time  Skin:  Skin Assessment: Skin Integrity Issues: Skin Integrity Issues:: Stage II Stage II: coccyx  Last  BM:  9/30 type 7 430ml via colostomy  Height:  Ht Readings from Last 1 Encounters:  01/01/21 5\' 8"  (1.727 m)   Weight:  Wt Readings from Last 1 Encounters:  02/06/21 100.1 kg   Ideal Body Weight:  70 kg  BMI:  Body mass index is 33.55 kg/m.  Estimated Nutritional Needs:  Kcal:  4159-3012 Protein:  120-140 gm Fluid:  1 L + UOP    Larkin Ina, MS, RD, LDN (she/her/hers) RD  pager number and weekend/on-call pager number located in [place].

## 2021-02-06 NOTE — Progress Notes (Signed)
BFR decreased due to high arterial pressure alarms.

## 2021-02-06 NOTE — Procedures (Addendum)
I spoke to the patient's wife by phone after EGD.  I updated her as to the findings and GI plan for esophagitis and duodenal ulcers Time provided for questions and answers and she thanked me for the call

## 2021-02-06 NOTE — Progress Notes (Signed)
PT Cancellation Note  Patient Details Name: KYRIN GRATZ MRN: [id number] DOB: 1955-07-05   Cancelled Treatment:    Reason Eval/Treat Not Completed: Patient at procedure or test/unavailable.  Pt in endo this am and gone to HD this afternoon.  Likely will not make it to see him today. 02/06/2021  Ginger Carne., PT Acute [facility] Services [phone]  (pager) [phone]  (office)   Tessie Fass Idrissa Beville 02/06/2021, 3:21 PM

## 2021-02-06 NOTE — Anesthesia Preprocedure Evaluation (Addendum)
Anesthesia Evaluation  Patient identified by MRN, date of birth, ID band Patient awake  General Assessment Comment:Pt sedated  Reviewed: Allergy & Precautions, NPO status , Patient's Chart, lab work & pertinent test results, Unable to perform ROS - Chart review only  History of Anesthesia Complications (+) DIFFICULT AIRWAYNegative for: history of anesthetic complications  Airway Mallampati: Trach       Dental  (+) Poor Dentition   Pulmonary sleep apnea , COPD, former smoker,  VDRF (sepsis, pneumonia): s/p Trach   breath sounds clear to auscultation + decreased breath sounds      Cardiovascular hypertension, Pt. on medications (-) angina Rhythm:Regular Rate:Normal  12/02/2020 ECHO: EF 60-65%. LV has normal function,  no regional wall motion abnormalities. There  is moderate concentric LVH. Grade I diastolic dysfunction, no significant valvular abnormalities    Neuro/Psych  Headaches, Seizures -,  Frontal brain tumor CVA    GI/Hepatic Neg liver ROS, GERD  Medicated and Controlled,  Endo/Other  diabetes, Oral Hypoglycemic AgentsMorbid obesity  Renal/GU Renal InsufficiencyRenal disease     Musculoskeletal   Abdominal (+) + obese,   Peds  Hematology  (+) Blood dyscrasia (Hb 8.5), anemia ,   Anesthesia Other Findings   Reproductive/Obstetrics                            Anesthesia Physical  Anesthesia Plan  ASA: 4  Anesthesia Plan: MAC   Post-op Pain Management:    Induction:   PONV Risk Score and Plan: 2 and Ondansetron, Propofol infusion and Treatment may vary due to age or medical condition  Airway Management Planned: Tracheostomy  Additional Equipment: None  Intra-op Plan:   Post-operative Plan:   Informed Consent: I have reviewed the patients History and Physical, chart, labs and discussed the procedure including the risks, benefits and alternatives for the proposed anesthesia  with the patient or authorized representative who has indicated his/her understanding and acceptance.       Plan Discussed with: CRNA  Anesthesia Plan Comments:         Anesthesia Quick Evaluation

## 2021-02-06 NOTE — Progress Notes (Signed)
Treatment restarted after machine restring.

## 2021-02-06 NOTE — Progress Notes (Signed)
Pt off the floor at this time. RT to assess pt for routine trach check once back on the unit.

## 2021-02-06 NOTE — Progress Notes (Addendum)
[facility] PROGRESS NOTE  Thomas [name] JKD:[id number] DOB: 04-10-1956 DOA: 11/30/2020 PCP: [name], NP  Status: Remains inpatient appropriate because:Persistent severe electrolyte disturbances, Unsafe d/c plan, and Inpatient level of care appropriate due to severity of illness  Dispo: The patient is from: Home              Anticipated d/c is to: SNF versus LTAC vs CIR              Patient currently is not medically stable to d/c.   Difficult to place patient Yes               Barriers to discharge: Currently has tracheostomy in place and is requiring dialysis but remains too weak to tolerate dialysis in chair which is a requirement for outpatient HD    Level of care: Progressive  Code Status: Full Family Communication: Attending physician spoke with wife at bedside 9/26 DVT prophylaxis: Eliquis COVID vaccination status: Moderna 07/08/2019 and 08/11/2019 with first booster dose given 08/05/2020   HPI:  65 y.o. male with a history of T2DM with neuropathy, HTN, HLD., colon cancer s/p resection and colostomy May 2022 s/p 3 cycles chemotherapy (last was July 2022). Followed by Dr. Benay Spice. Started CAPOX June 2022, reduced cepecitabine dose starting 11/19/2020 due to hand/foot syndrome, reduced oxaliplatin due to renal impairment and poor performance status. Presented to the ED 7/24 with abdominal pain found to be febrile, tachycardic, tachypneic. Lactic acid was 6.6 and persisted at 6.8 after sepsis IV fluid bolus and cefepime. He had ARF, demand ischemia, bandemia, was hyperglycemic with negative ketones, with lactic acidosis. He developed AFib with RVR in the ED, was started on diltiazem, and was admitted to the ICU for septic shock with unclear source. Sepsis was subsequently felt to be ruled out at time of admission, presentation felt more likely to be related to cepacitabine toxicity with oral mucositis, hand/foot syndrome, and diarrhea/high output colostomy.   He was transferred  to hospitalist service 7/27 but developed metabolic encephalopathy with hyoxia due to aspiration pneumonia on 7/30 requiring intubation and pressor support. Failed extubation with inability to clear secretions, requiring reintubation 8/8, and tracheostomy subsequently placed 8/11. This was pulled out 8/22, replaced under bronch guidance, again removed 8/25 requiring reintubation after failed reinsertion. ENT revised tracheostomy in the OR 8/29. He has required IHD for ARF, and also had complications of RLE DVT now on eliquis, colitis thought to be ischemic 8/2 not requiring specific intervention, Pseudomonas pneumonia having completed treatment, agitated encephalopathy which has improved, anemia requiring 4u PRBCs total, and thrombocytopenia which has resolved. On 9/4 he was transferred to hospitalist service having tolerated trach collar.  Patient was also evaluated by Dr. Benay Spice during this admission clarified that patient was receiving curative chemotherapy prior to admission  Subjective: Patient reporting excessive fatigue.  He reports that at 4 AM he was taken for his MRI and earlier this morning he was taken for EGD.  He is now getting ready to go to MRI again to have a contrasted study.  He is requesting not to go to dialysis today if possible and instead go tomorrow.  He is reporting that his nausea is better.  Objective: Vitals:   02/06/21 0315 02/06/21 0357  BP:  (!) 153/61  Pulse: 87 90  Resp: 18 16  Temp:  98 F (36.7 C)  SpO2: 96% 99%    Intake/Output Summary (Last 24 hours) at 02/06/2021 0757 Last data filed at 02/06/2021 0435 Gross per 24  hour  Intake 1323.21 ml  Output 750 ml  Net 573.21 ml   Filed Weights   02/03/21 1125 02/05/21 0500 02/06/21 0429  Weight: 98.5 kg 98.5 kg 100.1 kg    Exam: Constitutional: NAD, calm Respiratory: #6 XLT uncuffed trach, FiO2 28% trach collar, anterior lung sounds are clear, no increased work of breathing, pulse oximeter Cardiovascular:  S1-S2, BP normal, extremities warm to touch, regular pulse tree 100% Abdomen: Abdomen soft and not distended.  No further pain with palpation.  Normoactive bowel sounds stoma pink with thick liquid stool in bedside drainage bag Genitourinary: Foley catheter in place with yellow urine noted in drainage bag Neurologic: CN 2-12 grossly intact. Sensation intact, Strength 3/5 x all 4 extremities.  Psychiatric: Normal judgment and insight. Alert and oriented x 3.     Assessment/Plan: Acute problems:  Acute hypoxic respiratory failure s/p tracheostomy due to aspiration pneumonia  Recurrent pansensitive Pseudomonas tracheobronchitis -Tolerating PMV.  Trach team wants to make sure patient more mobile with stronger vocalization before trying capping trials. -Continue Robinul  -SLP following for PMV training.  Grade LA C reflux esophagitis/nonbleeding duodenal ulcers -EGD 9/30 with the above findings -H. pylori pending -Recommendation is to advance diet as tolerated for will start previous tube feeding at 30 cc/h- if tolerates for 24 hours recommend advance to recommended rate of 55 cc/h -Patient needs to continue PPI for total of 4-weeks -If has refractory nausea can give Reglan -Surgical team has evaluated the patient and finds no acute surgical issues to explain his symptoms; sx's more c/w functional motility issues -Keep K > 4.0 and Mg > 2.0--follow labs -Continue empiric IV Protonix twice daily and IV Pepcid twice daily tolerates slow advance of tube feedings can transition medications back to oral -Follow-up EGD recommended after discharge  Funguria -Diflucan completed 9/27  Thrush -Continue nystatin  Inability to elevate tongue/MRI with bilateral punctate lesion -SLP evaluated and noted patient is unable to elevate tongue properly bilaterally-continues to have difficulty with swallowing and phonation -Neurology ordered noncontrasted MRI that revealed multiple punctate foci of restricted  diffusion in bilateral frontal white matter most likely acute infarcts.  This unexpected finding was discussed with Dr. Leonel Ramsay who agrees this is most likely an infarct and will have stroke team rounded on him today. -Radiologist recommended MRI with contrast to include postcontrast images to ensure these areas are not metastasis given his history of colon cancer -Patient currently on full dose Lovenox, statin currently on hold due to recent GI issues, Hgba1c 7/25 was 8.7; lipid panel today within appropriate ranges although HDL cholesterol is low LDL cholesterol is on -Stroke team has ordered echocardiogram and carotid duplex -MRI with contrast with postcontrast review of 2 punctate images -unable to differentiate whether related to infarct or small metastasis.  Recommendation is follow-up MRI in 6 to 8 weeks with and without contrast  Urinary retention -Has completed Diflucan as of 9/28 -We will continue Foley for now while nephrology trying to determine if patient is having urinary output consistently without dialysis.   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   - Continue HD TTS -Initially not expected to recover renal function -nephrology now documenting that he may be headed towards renal recovery.   -Nephrology plans dialysis Friday and then giving the weekend off, then evaluating for any signs of recovery.  Since he is receiving contrast with his MRI today he unfortunately will need to  go to dialysis.  Patient previously requested something to help him rest prior to dialysis and I have ordered Ativan IV x1 dose with patient's nurse aware   High output ostomy, diarrhea:  -Infectious work-up negative -Recent PSBO likely caused by Imodium and discontinued   Anemia of critical illness, acute blood loss as well as anemia of chronic disease and iron deficiency:  - s/p 4u PRBCs this hospitalization  - IV iron TTS x8  doses per nephrology. -Current hemoglobin stable at 10.6 with normal platelets   Stage IIIb colon CA s/p partial transverse colectomy with end colostomy May 2022:  - Palliative care has been involved during this hospitalization- goals remain clear: full aggressive measures desired.  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   Uncontrolled T2DM on OHAs prior to admission with associated neuropathy:  HbA1c 8.7% at admission. - Continue levemir and SSI  -Gabapentin on hold due to recurrent nausea and vomiting   HTN:  -Norvasc on hold secondary to N/V   HLD:  -Atorvastatin on hold due to nausea and vomiting   Thrombocytopenia:  -Transient likely due to critical illness. HIT Ab negative on 8/5.    ?Sepsis - initially thought to be present on admission- has been ruled out.    Pressure Injury 02/05/21 Coccyx Lower Stage 2 -  Partial thickness loss of dermis presenting as a shallow open injury with a red, pink wound bed without slough. healing  quarter sized spot on sacrum (Active)  Date First Assessed/Time First Assessed: 02/05/21 0800   Location: Coccyx  Location Orientation: Lower  Staging: Stage 2 -  Partial thickness loss of dermis presenting as a shallow open injury with a red, pink wound bed without slough.  Wound Descript...    Assessments 02/05/2021  5:10 PM  Dressing Type Foam - Lift dressing to assess site every shift  Dressing Changed  State of Healing Early/partial granulation  Site / Wound Assessment Pink  % Wound base Red or Granulating 100%  Wound Length (cm) 2 cm  Wound Width (cm) 2 cm  Wound Surface Area (cm^2) 4 cm^2  Margins Attached edges (approximated)  Drainage Amount None  Treatment Cleansed     No Linked orders to display     Data Reviewed: Basic Metabolic Panel: Recent Labs  Lab 02/03/21 0647 02/04/21 0400 02/05/21 0244 02/05/21 2159 02/06/21 0434  NA 135 135 135 136 139  K 3.4* 3.6 5.2* 4.2 4.3  CL 98 95* 98 103 105  CO2 21* 25 21* 19* 20*  GLUCOSE 141* 146* 168* 185* 133*  BUN 79* 48* 62* 72* 72*  CREATININE 6.07* 4.86* 6.42* 6.84* 6.71*  CALCIUM 9.4  9.3 9.5 9.0 9.0  MG  --  2.0 2.0  --  1.9  PHOS 6.1* 5.0* 5.2* 5.8* 6.3*   Liver Function Tests: Recent Labs  Lab 02/03/21 0647 02/04/21 0400 02/05/21 0244 02/05/21 2159 02/06/21 0434  ALBUMIN 2.8* 3.0* 3.1* 2.7* 2.6*    CBC: Recent Labs  Lab 01/30/21 1937 01/31/21 0814 02/03/21 0647  WBC 23.9* 19.1* 18.7*  NEUTROABS 16.9*  --   --   HGB 9.2* 8.5* 8.5*  HCT 26.7* 25.5* 25.4*  MCV 84.5 86.1 85.5  PLT 381 324 381    CBG: Recent Labs  Lab 02/05/21 1145 02/05/21 1634 02/05/21 2030 02/05/21 2328 02/06/21 0354  GLUCAP 157* 148* 160* 183* 125*    Scheduled Meds:  chlorhexidine gluconate (MEDLINE KIT)  15 mL Mouth Rinse BID   Chlorhexidine Gluconate Cloth  6 each Topical Q0600   enoxaparin (LOVENOX) injection  90 mg Subcutaneous Q24H   famotidine (PEPCID) IV  20 mg Intravenous Q24H   feeding supplement (NEPRO  CARB STEADY)  1,000 mL Per Tube Q24H   feeding supplement (PROSource TF)  45 mL Per Tube BID   hydrocerin   Topical BID   insulin aspart  0-15 Units Subcutaneous Q4H   insulin detemir  10 Units Subcutaneous Daily   mouth rinse  15 mL Mouth Rinse 10 times per day   nystatin  5 mL Mouth/Throat QID   ondansetron  4 mg Intravenous Q6H   pantoprazole (PROTONIX) IV  40 mg Intravenous Q12H   scopolamine  1 patch Transdermal Q72H   Continuous Infusions:  sodium chloride Stopped (12/20/20 0955)   sodium chloride 20 mL/hr at 02/05/21 2328   anticoagulant sodium citrate     dextrose 5 % and 0.9% NaCl 75 mL/hr at 02/05/21 1800   levETIRAcetam 500 mg (02/05/21 2146)   promethazine (PHENERGAN) injection (IM or IVPB) 12.5 mg (02/05/21 0416)    Principal Problem:   Diabetes mellitus with hyperosmolarity without hyperglycemic hyperosmolar nonketotic coma (HCC) Active Problems:   Lactic acidosis   Acute renal failure superimposed on stage 2 chronic kidney disease (HCC)   Metabolic acidosis   Tachycardia   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 (HCC)   Adverse effect of chemotherapy   Pressure injury of skin   Atrial fibrillation with RVR (HCC)   Sepsis (HCC)   Malnutrition of moderate degree   Acute metabolic encephalopathy   Acute respiratory failure with hypoxemia (HCC)   Acute renal failure with acute cortical necrosis (HCC)   Shock ([place])   Status post tracheostomy ([place])   Endotracheal tube present   Hemodialysis status ([place])   Palliative care by specialist   SOB (shortness of breath)   Non-intractable vomiting   Abdominal pain, epigastric   Cerebral embolism with cerebral infarction   Consultants: PCCM General surgery Nephrology Hematology/oncology Palliative care medicine team  Procedures: 2u PRBC 7/31, 1u PRBC 8/17, 1u PRBC 8/27 Tracheostomy revision 01/05/2021 Dr. Constance Holster [facility] 8/22    Antibiotics: Vancomycin 7/24 Cefepime 7/24 - 7/27 Unasyn 7/30  >> cefepime/flagyl 7/31 - 8/6     Time spent: 40 minutes    [name] ANP  [facility] 7 am - 330 pm/M-F for direct patient care and secure chat Please refer to Amion for contact info 67  days

## 2021-02-06 NOTE — Progress Notes (Signed)
Patient with multiple arterial alarms during HD. Access flushed, Lines reversed, and bfr decreased. Treatment was paused and machine restrung to prevent clotting and blood loss. Dr. Joylene Grapes notified.

## 2021-02-06 NOTE — Progress Notes (Signed)
STROKE TEAM PROGRESS NOTE   INTERVAL HISTORY No one is at the bedside.  He states he has had trouble with his tongue for several weeks.  He denies any focal neurological symptoms suggestive of stroke but MRI scan shows tiny bilateral punctate embolic infarcts.  He has a history of A. fib as well as acute DVT and is on Eliquis.  Echocardiogram on 12/02/2020 showed ejection fraction of 60 to 65% with moderate calcific aortic stenosis.  Vitals:   02/06/21 1430 02/06/21 1500 02/06/21 1530 02/06/21 1600  BP: 123/78 121/82 128/79 107/83  Pulse: 98 (!) 103 (!) 105 (!) 102  Resp:      Temp:      TempSrc:      SpO2: 100% 100% 100% 100%  Weight:      Height:       CBC:  Recent Labs  Lab 01/30/21 1937 01/31/21 0814 02/03/21 0647  WBC 23.9* 19.1* 18.7*  NEUTROABS 16.9*  --   --   HGB 9.2* 8.5* 8.5*  HCT 26.7* 25.5* 25.4*  MCV 84.5 86.1 85.5  PLT 381 324 481   Basic Metabolic Panel:  Recent Labs  Lab 02/05/21 0244 02/05/21 2159 02/06/21 0434  NA 135 136 139  K 5.2* 4.2 4.3  CL 98 103 105  CO2 21* 19* 20*  GLUCOSE 168* 185* 133*  BUN 62* 72* 72*  CREATININE 6.42* 6.84* 6.71*  CALCIUM 9.5 9.0 9.0  MG 2.0  --  1.9  PHOS 5.2* 5.8* 6.3*   Lipid Panel:  Recent Labs  Lab 02/06/21 0500  CHOL 115  TRIG 100  HDL 28*  CHOLHDL 4.1  VLDL 20  LDLCALC 67   HgbA1c:  Recent Labs  Lab 02/06/21 1420  HGBA1C 6.4*   Urine Drug Screen: No results for input(s): LABOPIA, COCAINSCRNUR, LABBENZ, AMPHETMU, THCU, LABBARB in the last 168 hours.  Alcohol Level No results for input(s): ETH in the last 168 hours.  IMAGING past 24 hours MR BRAIN WO CONTRAST  Result Date: 02/05/2021 CLINICAL DATA:  Neuro deficit, acute, stroke suspected EXAM: MRI HEAD WITHOUT CONTRAST TECHNIQUE: Multiplanar, multiecho pulse sequences of the brain and surrounding structures were obtained without intravenous contrast. COMPARISON:  CT 12/05/2020.  MRI 04/10/2014. FINDINGS: Brain: Multiple punctate foci of  restricted diffusion in bilateral frontal white matter (series 5, image 89). Mild associated edema without mass effect. Chronic encephalomalacia in the left frontal lobe secondary to resection of an extra-axial mass (see MRI from 04/10/2014). Surrounding T2 hyperintensity, nonspecific but compatible with gliosis. No mass effect. The absence of contrast precludes evaluation for enhancement. 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 No hydrocephalus, midline shift, acute hemorrhage, or extra-axial fluid collection. Generalized atrophy. Vascular: Major arterial flow voids are maintained at the skull base. Skull and upper cervical spine: Normal marrow signal. Sinuses/Orbits: Mild paranasal sinus mucosal thickening. No acute orbital findings. Other: Moderate bilateral mastoid effusions. IMPRESSION: 1. Multiple punctate foci of restricted diffusion in bilateral frontal white matter, most likely acute infarcts. Given the patient's known history of colon cancer, consider postcontrast imaging to ensure these areas do not enhance and exclude metastatic disease. 2. Postsurgical left frontal encephalomalacia and craniotomy, described above. 3. Moderate bilateral mastoid effusions. Electronically Signed   By: Margaretha Sheffield M.D.   On: 02/05/2021 20:00   MR BRAIN W CONTRAST  Result Date: 02/06/2021 CLINICAL DATA:  Stroke follow-up. EXAM: MRI HEAD WITH CONTRAST TECHNIQUE: Multiplanar, multiecho pulse sequences of the brain and surrounding structures were obtained with intravenous contrast. CONTRAST:  43mL GADAVIST GADOBUTROL 1 MMOL/ML IV SOLN COMPARISON:  MRI of the brain February 05, 2021 FINDINGS: Postcontrast images obtained and compared to recent MRI of the brain pre contrast images. Two of the foci of restricted diffusion previously described on prior MRI showed faint contrast enhancement, in the deep white matter of each frontal lobe (series 5, images 33 and 35). No other focus of abnormal contrast enhancement.  IMPRESSION: Enhancement of 2 of the foci of restricted diffusion described on prior MRI. Differential diagnosis remains the same including infarct versus small metastatic disease given the acute/subacute infarct may show contrast enhancement. A 6-8 week follow-up MRI with and without contrast is recommended. Electronically Signed   By: Pedro Earls M.D.   On: 02/06/2021 13:21   DG Abd Portable 1V  Result Date: 02/06/2021 CLINICAL DATA:  Feeding tube placement EXAM: PORTABLE ABDOMEN - 1 VIEW COMPARISON:  Portable exam 1041 hours compared to 02/05/2021 FINDINGS: Tip of feeding tube projects over duodenal bulb/proximal descending duodenum. Paucity of bowel gas. Lung bases clear. Osseous structures unremarkable. IMPRESSION: Tip of feeding tube projects over duodenal bulb/proximal descending duodenum Electronically Signed   By: Lavonia Dana M.D.   On: 02/06/2021 13:36    PHYSICAL EXAM Frail cachectic middle-aged African-American male status post tracheostomy. . Afebrile. Head is nontraumatic. Neck is supple without bruit.    Cardiac exam no murmur or gallop. Lungs are clear to auscultation. Distal pulses are well felt.  Neurological Exam :  Awake alert oriented to time place and person.  Status post tracheostomy with Passy-Muir valve.  Able to speak with slight dysarthria.  Good comprehension and naming.  Extraocular movements full range without nystagmus.  Blinks to threat bilaterally.  Face is symmetric without weakness.  Tongue is midline patient has subjective weakness of tongue and difficulty with protrusion of side-to-side movement but is able to move it slightly up and down.  Tongue does not appear to have wasting or fasciculations.  Able to move all 4 extremities well against gravity without focal weakness.  Sensation intact bilaterally.  Coordination intact reflexes symmetric.  Plantars downgoing.  Gait not tested.. ASSESSMENT/PLAN  Thomas [name] is a 65 y.o. male with a medical history  significant for essential hypertension, hyperlipidemia, epilepsy on Keppra, type 2 diabetes mellitus with peripheral neuropathy, frontal meningioma s/p craniotomy 2018, GERD, MCA stroke, stage 3 colon cancer s/p partial colectomy and end colostomy on 09/10/2020, and chemotherapy with 3rd cycle in July 2022 who presented to the ED 7/24 for evaluation of abdominal pain with fever, tachycardia, tachypnea, elevated lactate was found to be septic.  While in the ED patient developed atrial fibrillation with RVR and was admitted to the ICU for septic shock with unclear source, however it is felt that sepsis was unlikely at the time of admission and that presentation was more likely related to cepacitabine toxicity with oral mucositis and hand/foot syndrome. He has had a prolonged hospitalization of 67 days with concern for sepsis versus capacitabine toxicity with oral mucositis and hand/foot syndrome with ongoing tongue paresis affecting his ability to speak and swallow. - Examination reveals patient with tongue paresis, speech assisted via PMSV, and oral thrush.  Patient's soft palate elevates symmetrically.  Patient does have BLE weakness likely due to deconditioning from prolonged hospitalization. Patient states that he feels with improvement of oral thrush and removal of NG tube history mobility will improve and he will be able to feed himself and swallow appropriately. - SLP noted patient demonstrated lingual retraction  with some lingual elevation. He was able to tolerate a thin liquid bolus without overt signs/symptoms of aspiration.  -Tongue weakness is most likely an incidental finding exact etiology unclear possibly mechanical injury during repeat intubation though possible, bilateral hypoglossal nerve palsies resulting in tongue paresis are rare in isolation. This could be the result of laryngoscopy with multiple intubations with possible hypoglossal nerve injury however, patient does not have complete paresis  as he retains some lingual retraction and elevation on examination.    Multiple punctate foci of restricted diffusion in bilateral frontal white matter, most likely silent subacute infarcts.  Differential diagnosis remains the same including infarct versus small metastatic disease given the acute/subacute infarct may show contrast enhancement.  Paradoxical embolism given acute DVT if he has a PFO is also a possibility.     MRI [place] wo :Multiple punctate foci of restricted diffusion in bilateral frontal white matter, most likely acute infarcts. Given the patient's known history of colon cancer, consider postcontrast imaging to ensure these areas do not enhance and exclude metastatic MRI 9/30 Enhancement of 2 of the foci of restricted diffusion described on prior MRI. Differential diagnosis remains the same including infarct versus small metastatic disease given the acute/subacute infarct may show contrast enhancement. A 6-8 week follow-up MRI with and without contrast is recommended.   MRA not ordered Carotid Doppler pending 2D Echo 12/02/2020 shows ejection fraction 60 to 65% and moderate calcific aortic stenosis  LDL 67 HgbA1c 6.4 VTE prophylaxis -Lovenox    There are no active orders of the following types: Diet, Nourishments.  No antiplatelet or antithrombotic prior to admission Continue Eliquis     Therapy recommendations:  SNF Disposition:  TBD  Atrial fibrillation with RVR RLE DVT  On Eliquis   Hypertension Stable Permissive hypertension (OK if < 220/120) but gradually normalize in 5-7 days Long-term BP goal normotensive  Hyperlipidemia Home meds:  lipitor 20mg , continue home dose  LDL 67, at goal < 70 High intensity statin not indicated as at goal  Continue statin at discharge  Diabetes type II Controlled HgbA1c 6.4, goal < 7.0 CBGs Recent Labs    02/06/21 0354 02/06/21 0841 02/06/21 1252  GLUCAP 125* 119* 117*    Management per primary team Close follow  up with PCP at discharge to control secondary stroke risk factors   Other Stroke Risk Factors  Former Cigarette smoker Obesity, Body mass index is 33.86 kg/m., BMI >/= 30 associated with increased stroke risk, recommend weight loss, diet and exercise as appropriate Obstructive sleep apnea, uncertain if on CPAP at home Congestive heart failure  Other [facility] day # 67 This patient was seen and evaluated with Dr. Leonie Man. He directed the plan of care.  Charlene Brooke, NP-C   STROKE MD NOTE : I have personally obtained history,examined this patient, reviewed notes, independently viewed imaging studies, participated in medical decision making and plan of care.ROS completed by me personally and pertinent positives fully documented  I have made any additions or clarifications directly to the above note. Agree with note above.  Patient has a prolonged hospital admission due to sepsis and has DVT and atrial fibrillation.  His main complaint is weakness of his tongue which is subacute for several weeks but MRI shows incidental tiny bilateral embolic infarcts which could be from A. fib or paradoxical embolism given his DVT may have a PFO.  Recommend transplant Doppler bubble study to look for PFO and check carotid ultrasound and transcranial Doppler studies.  Continue Eliquis for anticoagulation.  On discussion patient and answered questions.  Discussed with Dr. Lilyan Gilford  Greater than 50% time during this  35 minute visit was spent on counseling and coordination of care with patient and care team.  Antony Contras, MD Medical Director [facility] Pager: [phone] 02/06/2021 4:20 PM   To contact Stroke Continuity provider, please refer to [email or web address] After hours, contact General Neurology

## 2021-02-06 NOTE — Progress Notes (Signed)
Attempted carotid artery duplex, TCD, and TCD bubble study, however patient was in MRI, and now going to HD. Will attempt again 10/1 as schedule permits.  02/06/2021 12:56 PM Kelby Aline.,  MHA, RVT, RDCS, RDMS

## 2021-02-06 NOTE — Progress Notes (Signed)
Pt brought down to MRI for imaging w/ gadolinium. [place] for gad and then to have HD per Nephrology. RN aware.

## 2021-02-06 NOTE — Plan of Care (Signed)

## 2021-02-07 ENCOUNTER — Inpatient Hospital Stay (HOSPITAL_COMMUNITY): Payer: Medicare HMO

## 2021-02-07 DIAGNOSIS — I4891 Unspecified atrial fibrillation: Secondary | ICD-10-CM | POA: Diagnosis not present

## 2021-02-07 DIAGNOSIS — E440 Moderate protein-calorie malnutrition: Secondary | ICD-10-CM | POA: Diagnosis not present

## 2021-02-07 DIAGNOSIS — E1100 Type 2 diabetes mellitus with hyperosmolarity without nonketotic hyperglycemic-hyperosmolar coma (NKHHC): Secondary | ICD-10-CM | POA: Diagnosis not present

## 2021-02-07 DIAGNOSIS — Z992 Dependence on renal dialysis: Secondary | ICD-10-CM | POA: Diagnosis not present

## 2021-02-07 LAB — GLUCOSE, CAPILLARY
Glucose-Capillary: 138 mg/dL — ABNORMAL HIGH (ref 70–99)
Glucose-Capillary: 150 mg/dL — ABNORMAL HIGH (ref 70–99)
Glucose-Capillary: 154 mg/dL — ABNORMAL HIGH (ref 70–99)
Glucose-Capillary: 131 mg/dL — ABNORMAL HIGH (ref 70–99)
Glucose-Capillary: 122 mg/dL — ABNORMAL HIGH (ref 70–99)
Glucose-Capillary: 119 mg/dL — ABNORMAL HIGH (ref 70–99)

## 2021-02-07 LAB — CBC
HCT: 26.4 % — ABNORMAL LOW (ref 39.0–52.0)
Hemoglobin: 8.7 g/dL — ABNORMAL LOW (ref 13.0–17.0)
MCH: 28.2 pg (ref 26.0–34.0)
MCHC: 33 g/dL (ref 30.0–36.0)
MCV: 85.4 fL (ref 80.0–100.0)
Platelets: 336 10*3/uL (ref 150–400)
RBC: 3.09 MIL/uL — ABNORMAL LOW (ref 4.22–5.81)
RDW: 14.9 % (ref 11.5–15.5)
WBC: 18.4 10*3/uL — ABNORMAL HIGH (ref 4.0–10.5)
nRBC: 0 % (ref 0.0–0.2)

## 2021-02-07 LAB — RENAL FUNCTION PANEL
Albumin: 2.8 g/dL — ABNORMAL LOW (ref 3.5–5.0)
Anion gap: 16 — ABNORMAL HIGH (ref 5–15)
BUN: 32 mg/dL — ABNORMAL HIGH (ref 8–23)
CO2: 19 mmol/L — ABNORMAL LOW (ref 22–32)
Calcium: 9 mg/dL (ref 8.9–10.3)
Chloride: 102 mmol/L (ref 98–111)
Creatinine, Ser: 4.22 mg/dL — ABNORMAL HIGH (ref 0.61–1.24)
GFR, Estimated: 15 mL/min — ABNORMAL LOW (ref >60)
Glucose, Bld: 134 mg/dL — ABNORMAL HIGH (ref 70–99)
Phosphorus: 2.3 mg/dL — ABNORMAL LOW (ref 2.5–4.6)
Potassium: 3.7 mmol/L (ref 3.5–5.1)
Sodium: 137 mmol/L (ref 135–145)

## 2021-02-07 LAB — MAGNESIUM: Magnesium: 1.8 mg/dL (ref 1.7–2.4)

## 2021-02-07 MED ORDER — HALOPERIDOL LACTATE 5 MG/ML IJ SOLN
5.0000 mg | Freq: Once | INTRAMUSCULAR | Status: AC
  Administered 2021-02-07: 5 mg via INTRAVENOUS
  Filled 2021-02-07: qty 1

## 2021-02-07 NOTE — Progress Notes (Addendum)
STROKE TEAM PROGRESS NOTE   INTERVAL HISTORY RN is at the bedside.  Patient just came back from hemodialysis.  Still has copious secretions from trach, needing frequent suctioning.  Patient able to speak more, tongue movement seems improving with speaking, still not able to stick out though.  Currently still NPO until feeding.  No fever, previous blood culture negative.  Vitals:   02/07/21 1530 02/07/21 1645 02/07/21 1700 02/07/21 1825  BP: (!) 143/59 121/71 118/69   Pulse: (!) 118 (!) 114 (!) 114 (!) 109  Resp:  19 15 18   Temp:  98.1 F (36.7 C) 97.9 F (36.6 C)   TempSrc:  Oral Axillary   SpO2:  100% 100% 100%  Weight:  97.2 kg    Height:       CBC:  Recent Labs  Lab 02/03/21 0647 02/07/21 0952  WBC 18.7* 18.4*  HGB 8.5* 8.7*  HCT 25.4* 26.4*  MCV 85.5 85.4  PLT 381 001   Basic Metabolic Panel:  Recent Labs  Lab 02/06/21 0434 02/07/21 0258  NA 139 137  K 4.3 3.7  CL 105 102  CO2 20* 19*  GLUCOSE 133* 134*  BUN 72* 32*  CREATININE 6.71* 4.22*  CALCIUM 9.0 9.0  MG 1.9 1.8  PHOS 6.3* 2.3*   Lipid Panel:  Recent Labs  Lab 02/06/21 0500  CHOL 115  TRIG 100  HDL 28*  CHOLHDL 4.1  VLDL 20  LDLCALC 67   HgbA1c:  Recent Labs  Lab 02/06/21 1420  HGBA1C 6.4*   Urine Drug Screen: No results for input(s): LABOPIA, COCAINSCRNUR, LABBENZ, AMPHETMU, THCU, LABBARB in the last 168 hours.  Alcohol Level No results for input(s): ETH in the last 168 hours.  IMAGING past 24 hours No results found.  PHYSICAL EXAM Frail cachectic middle-aged African-American male status post tracheostomy. . Afebrile. Head is nontraumatic. Neck is supple without bruit.    Cardiac exam no murmur or gallop. Lungs are clear to auscultation. Distal pulses are well felt.  Neurological Exam:  Awake alert oriented to time place and person.  Status post tracheostomy with Passy-Muir valve.  Able to speak with slight dysarthria and hypophonia.  Good comprehension and naming.  Extraocular  movements full range without nystagmus.  Blinks to threat bilaterally.  Mild right nasolabial fold flattening.  Not able to stick out tongue but able to move it slightly up and down inside mouth.  Able to move all 4 extremities well against gravity without lateralized weakness.  Sensation intact bilaterally.  Finger-to-nose bilaterally intact.   ASSESSMENT/PLAN Thomas [name] is a 65 y.o. male with a medical history significant for essential hypertension, hyperlipidemia, epilepsy on Keppra, type 2 diabetes mellitus with peripheral neuropathy, frontal meningioma s/p craniotomy 2018, GERD, MCA stroke, stage 3 colon cancer s/p partial colectomy and end colostomy on 09/10/2020, and chemotherapy with 3rd cycle in July 2022 who presented to the ED 7/24 for evaluation of abdominal pain with fever, tachycardia, tachypnea, elevated lactate was found to be septic.  While in the ED patient developed atrial fibrillation with RVR and was admitted to the ICU for septic shock with unclear source, however it is felt that sepsis was unlikely at the time of admission and that presentation was more likely related to cepacitabine toxicity with oral mucositis and hand/foot syndrome. He has had a prolonged hospitalization of 67 days with concern for sepsis versus capacitabine toxicity with oral mucositis and hand/foot syndrome with ongoing tongue paresis affecting his ability to speak and swallow. Examination  reveals patient with tongue paresis, speech assisted via PMSV, and oral thrush.  Patient's soft palate elevates symmetrically.  Patient does have BLE weakness likely due to deconditioning from prolonged hospitalization.   Tongue weakness, improving exact etiology unclear  Could be due to mucositis, oral thrush, hand/foot syndrome  Per Dr. Leonie Man "possibly mechanical injury during repeat intubation though possible, bilateral hypoglossal nerve palsies resulting in tongue paresis are rare in isolation. This could be the result of  laryngoscopy with multiple intubations with possible hypoglossal nerve injury however, patient does not have complete paresis as he retains some lingual retraction and elevation on examination".    Stroke, incidental finding - 3-4 punctate infarcts in bilateral frontal watershed area.  DDx including hypoperfusion in setting of sepsis, Afib RVR, paradoxical emboli given acute DVT if he has a PFO, or endocarditis however blood culture negative. MRI brain  - Multiple punctate foci of restricted diffusion in bilateral frontal white matter, most likely acute infarcts.  MRI with contrast Enhancement of 2 of the foci of restricted diffusion described on prior MRI. Differential diagnosis remains the same including infarct versus small metastatic disease given the acute/subacute infarct may show contrast enhancement. A 6-8 week follow-up MRI with and without contrast is recommended. TCD pending TCD bubble study pending Carotid Doppler pending 2D Echo 12/02/2020 EF 60 to 65%  LE venous doppler right DVT  LDL 67 HgbA1c 6.4 VTE prophylaxis - Lovenox No antiplatelet or antithrombotic prior to admission, now on lovenox 90mg  QD.  Continue Eliquis     Therapy recommendations:  SNF Disposition:  TBD  Atrial fibrillation with RVR Rate controlled On Eliquis -> lovenox 90mg  QD  RLE DVT  On Eliquis -> lovenox 90mg  QD TCD bubble study pending  Hypertension Stable  Hyperlipidemia Home meds:  lipitor 20mg , continue home dose  LDL 67, at goal < 70 Resume Lipitor once p.o. access Continue statin at discharge  Diabetes type II Controlled HgbA1c 6.4, goal < 7.0 CBGs SSI Management per primary team Close follow up with PCP  Other Stroke Risk Factors Former Cigarette smoker Obesity, Body mass index is 32.58 kg/m., BMI >/= 30 associated with increased stroke risk, recommend weight loss, diet and exercise as appropriate Obstructive sleep apnea, uncertain if on CPAP at home  Other Active  Problems Sepsis - management per primary team History of epilepsy on Keppra - EEG negative for seizure - continue Keppra Frontal meningioma status postsurgery in 2018 Colon cancer postsurgery and colostomy 09/2020, on chemo AKI on CRRT/HD  Hospital day # 68  I spent  35 minutes in total face-to-face time with the patient, more than 50% of which was spent in counseling and coordination of care, reviewing test results, images and medication, and discussing the diagnosis, treatment plan and potential prognosis. This patient's care requiresreview of multiple databases, neurological assessment, discussion with family, other specialists and medical decision making of high complexity.  Rosalin Hawking, MD PhD Stroke Neurology 02/07/2021 7:57 PM  To contact Stroke Continuity provider, please refer to [email or web address] After hours, contact General Neurology

## 2021-02-07 NOTE — Progress Notes (Signed)
[facility] night shift PCU coverage note.  The nursing staff reported that the patient is very restless despite receiving lorazepam and fentanyl.  He is currently being treated for acute respiratory failure in the setting of tracheostomy related aspiration pneumonia due to pansensitive Pseudomonas. Haloperidol 5 mg IVP x1 ordered.  Tennis Must, MD.

## 2021-02-07 NOTE — Progress Notes (Signed)
[facility] PROGRESS NOTE  Thomas [name] GEZ:[id number] DOB: 01-20-1956 DOA: 11/30/2020 PCP: [name], NP  Status: Remains inpatient appropriate because:Persistent severe electrolyte disturbances, Unsafe d/c plan, and Inpatient level of care appropriate due to severity of illness  Dispo: The patient is from: Home              Anticipated d/c is to: SNF versus LTAC vs CIR              Patient currently is not medically stable to d/c.   Difficult to place patient Yes               Barriers to discharge: Currently has tracheostomy in place and is requiring dialysis but remains too weak to tolerate dialysis in chair which is a requirement for outpatient HD    Level of care: Progressive  Code Status: Full Family Communication: Attending physician spoke with wife at bedside 9/26 DVT prophylaxis: Eliquis COVID vaccination status: Moderna 07/08/2019 and 08/11/2019 with first booster dose given 08/05/2020   HPI:  65 y.o. male with a history of T2DM with neuropathy, HTN, HLD., colon cancer s/p resection and colostomy May 2022 s/p 3 cycles chemotherapy (last was July 2022). Followed by Dr. Benay Spice. Started CAPOX June 2022, reduced cepecitabine dose starting 11/19/2020 due to hand/foot syndrome, reduced oxaliplatin due to renal impairment and poor performance status. Presented to the ED 7/24 with abdominal pain found to be febrile, tachycardic, tachypneic. Lactic acid was 6.6 and persisted at 6.8 after sepsis IV fluid bolus and cefepime. He had ARF, demand ischemia, bandemia, was hyperglycemic with negative ketones, with lactic acidosis. He developed AFib with RVR in the ED, was started on diltiazem, and was admitted to the ICU for septic shock with unclear source. Sepsis was subsequently felt to be ruled out at time of admission, presentation felt more likely to be related to cepacitabine toxicity with oral mucositis, hand/foot syndrome, and diarrhea/high output colostomy.   He was transferred  to hospitalist service 7/27 but developed metabolic encephalopathy with hyoxia due to aspiration pneumonia on 7/30 requiring intubation and pressor support. Failed extubation with inability to clear secretions, requiring reintubation 8/8, and tracheostomy subsequently placed 8/11. This was pulled out 8/22, replaced under bronch guidance, again removed 8/25 requiring reintubation after failed reinsertion. ENT revised tracheostomy in the OR 8/29. He has required IHD for ARF, and also had complications of RLE DVT now on eliquis, colitis thought to be ischemic 8/2 not requiring specific intervention, Pseudomonas pneumonia having completed treatment, agitated encephalopathy which has improved, anemia requiring 4u PRBCs total, and thrombocytopenia which has resolved. On 9/4 he was transferred to hospitalist service having tolerated trach collar.  Patient was also evaluated by Dr. Benay Spice during this admission clarified that patient was receiving curative chemotherapy prior to admission  Subjective: Patient seen and examined.  He is alert and oriented.  Complains of some abdominal pain.  No nausea.  No shortness of breath.  No other complaint.  Objective: Vitals:   02/07/21 0300 02/07/21 0815  BP: 124/72 117/85  Pulse:  (!) 103  Resp:  16  Temp: 97.7 F (36.5 C) 98 F (36.7 C)  SpO2:  100%    Intake/Output Summary (Last 24 hours) at 02/07/2021 1116 Last data filed at 02/07/2021 0900 Gross per 24 hour  Intake 752.05 ml  Output 2008 ml  Net -1255.95 ml    Filed Weights   02/06/21 1358 02/06/21 1714 02/07/21 0500  Weight: 101 kg 100.8 kg 98.7 kg  Exam: General exam: Appears calm and comfortable  Respiratory system: Clear to auscultation. Respiratory effort normal.  Tracheostomy in place. Cardiovascular system: S1 & S2 heard, RRR. No JVD, murmurs, rubs, gallops or clicks. No pedal edema. Gastrointestinal system: Abdomen is nondistended, soft and mild periumbilical tenderness. No organomegaly  or masses felt. Normal bowel sounds heard. Central nervous system: Alert and oriented. No focal neurological deficits. Extremities: Symmetric 5 x 5 power. Skin: No rashes, lesions or ulcers.  Psychiatry: Judgement and insight appear normal. Mood & affect appropriate.   Assessment/Plan: Acute problems:  Acute hypoxic respiratory failure s/p tracheostomy due to aspiration pneumonia  Recurrent pansensitive Pseudomonas tracheobronchitis -Tolerating PMV.  Trach team wants to make sure patient more mobile with stronger vocalization before trying capping trials. -Continue Robinul  -SLP following for PMV training.  Grade LA C reflux esophagitis/nonbleeding duodenal ulcers -EGD 9/30 with the above findings -H. pylori pending -Recommendation is to advance diet as tolerated for will start previous tube feeding at 30 cc/h- if tolerates for 24 hours recommend advance to recommended rate of 55 cc/h -Patient needs to continue PPI for total of 4-weeks -If has refractory nausea can give Reglan -Surgical team has evaluated the patient and finds no acute surgical issues to explain his symptoms; sx's more c/w functional motility issues -Keep K > 4.0 and Mg > 2.0--follow labs -Continue empiric IV Protonix twice daily and IV Pepcid twice daily tolerates slow advance of tube feedings can transition medications back to oral -Follow-up EGD recommended after discharge  Funguria -Diflucan completed 9/27  Thrush -Continue nystatin  Inability to elevate tongue/MRI with bilateral punctate lesion -SLP evaluated and noted patient is unable to elevate tongue properly bilaterally-continues to have difficulty with swallowing and phonation -Neurology ordered noncontrasted MRI that revealed multiple punctate foci of restricted diffusion in bilateral frontal white matter most likely acute infarcts.  This unexpected finding was discussed with Dr. Leonel Ramsay who agrees this is most likely an infarct and will have stroke  team rounded on him today. -Radiologist recommended MRI with contrast to include postcontrast images to ensure these areas are not metastasis given his history of colon cancer -Patient currently on full dose Lovenox, statin currently on hold due to recent GI issues, Hgba1c 7/25 was 8.7; lipid panel today within appropriate ranges although HDL cholesterol is low LDL cholesterol is on -Stroke team has ordered carotid ultrasound, transcranial Doppler.  All of those are pending. -MRI with contrast with postcontrast review of 2 punctate images -unable to differentiate whether related to infarct or small metastasis.  Recommendation is follow-up MRI in 6 to 8 weeks with and without contrast.   Urinary retention -We will continue Foley for now while nephrology trying to determine if patient is having urinary output consistently without dialysis.   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   - Continue HD TTS -Initially not expected to recover renal function -nephrology now documenting that he may be headed towards renal recovery.  Patient received dialysis on 02/06/2021.  Next dialysis Monday.  Appreciate nephrology help.   High output ostomy, diarrhea:  -Infectious work-up negative -Recent PSBO likely caused by Imodium and discontinued   Anemia of critical illness, acute blood loss as well as anemia of chronic disease and iron deficiency:  - s/p 4u PRBCs this hospitalization  - IV iron TTS x8 doses per nephrology. -Current hemoglobin stable at 10.6 with normal platelets   Stage IIIb colon CA  s/p partial transverse colectomy with end colostomy May 2022:  - Palliative care has been involved during this hospitalization- goals remain clear: full aggressive measures desired.  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   Uncontrolled T2DM on OHAs prior to admission with associated neuropathy:  HbA1c 8.7% at admission. - Continue levemir and SSI  -Gabapentin on hold due to recurrent nausea and vomiting   HTN:  -Norvasc on hold secondary to N/V.  Blood pressure fairly controlled.   HLD:  -Atorvastatin on hold due to nausea and vomiting   Thrombocytopenia:  -Transient likely due to critical illness. HIT Ab negative on 8/5.    ?Sepsis - initially thought to be present on admission- has been ruled out.    Pressure Injury 02/05/21 Coccyx Lower Stage 2 -  Partial thickness loss of dermis presenting as a shallow open injury with a red, pink wound bed without slough. healing quarter sized spot on sacrum (Active)  Date First Assessed/Time First Assessed: 02/05/21 0800   Location: Coccyx  Location Orientation: Lower  Staging: Stage 2 -  Partial thickness loss of dermis presenting as a shallow open injury with a red, pink wound bed without slough.  Wound Descript...    Assessments 02/05/2021  5:10 PM 02/06/2021  7:45 AM  Dressing Type Foam - Lift dressing to assess site every shift  Foam - Lift dressing to assess site every shift  Dressing Changed Clean;Dry;Intact  State of Healing Early/partial granulation Early/partial granulation  Site / Wound Assessment Pink Pink  % Wound base Red or Granulating 100% --  Wound Length (cm) 2 cm --  Wound Width (cm) 2 cm --  Wound Surface Area (cm^2) 4 cm^2 --  Margins Attached edges (approximated) --  Drainage Amount None None  Treatment Cleansed --     No Linked orders to display     Data Reviewed: Basic Metabolic Panel: Recent Labs  Lab 02/04/21 0400 02/05/21 0244 02/05/21 2159 02/06/21 0434 02/07/21 0258  NA 135 135 136 139 137  K 3.6 5.2* 4.2 4.3 3.7  CL 95* 98 103 105 102  CO2 25 21* 19* 20* 19*  GLUCOSE 146* 168* 185* 133* 134*  BUN 48* 62* 72* 72* 32*  CREATININE 4.86* 6.42* 6.84* 6.71* 4.22*  CALCIUM 9.3 9.5 9.0 9.0 9.0  MG 2.0 2.0  --  1.9 1.8  PHOS 5.0* 5.2* 5.8* 6.3* 2.3*    Liver Function Tests: Recent Labs  Lab 02/04/21 0400 02/05/21 0244 02/05/21 2159 02/06/21 0434 02/07/21 0258  ALBUMIN 3.0* 3.1* 2.7* 2.6* 2.8*     CBC: Recent Labs  Lab 02/03/21 0647 02/07/21 0952  WBC 18.7* 18.4*  HGB 8.5* 8.7*  HCT 25.4* 26.4*  MCV 85.5 85.4  PLT 381 336     CBG: Recent Labs  Lab 02/06/21 1252 02/06/21 1748 02/06/21 1956 02/07/21 0253 02/07/21 0847  GLUCAP 117* 121* 109* 138* 150*     Scheduled Meds:  chlorhexidine gluconate (MEDLINE KIT)  15 mL Mouth Rinse BID   Chlorhexidine Gluconate Cloth  6 each Topical Q0600   enoxaparin (LOVENOX) injection  90 mg Subcutaneous Q24H   feeding supplement (NEPRO CARB STEADY)  1,000 mL Per Tube Q24H   feeding supplement (PROSource TF)  45 mL Per Tube BID   hydrocerin   Topical BID   insulin aspart  0-15 Units Subcutaneous Q4H   insulin detemir  10 Units Subcutaneous Daily   mouth rinse  15 mL Mouth Rinse 10 times per day   nystatin  5 mL Mouth/Throat QID   ondansetron  4 mg Intravenous Q6H   pantoprazole (PROTONIX) IV  40 mg Intravenous  Q12H   scopolamine  1 patch Transdermal Q72H   Continuous Infusions:  sodium chloride Stopped (12/20/20 0955)   anticoagulant sodium citrate     dextrose 5 % and 0.9% NaCl 30 mL/hr at 02/06/21 1830   famotidine (PEPCID) IV Stopped (02/06/21 1829)   levETIRAcetam 500 mg (02/07/21 0903)   promethazine (PHENERGAN) injection (IM or IVPB) 12.5 mg (02/05/21 0416)    Principal Problem:   Diabetes mellitus with hyperosmolarity without hyperglycemic hyperosmolar nonketotic coma (HCC) Active Problems:   Lactic acidosis   Acute renal failure superimposed on stage 2 chronic kidney disease (HCC)   Metabolic acidosis   Tachycardia   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 (HCC)   Adverse effect of chemotherapy   Pressure injury of skin   Atrial fibrillation with RVR (HCC)   Sepsis (HCC)   Malnutrition of moderate degree   Acute metabolic encephalopathy   Acute respiratory failure with hypoxemia (HCC)   Acute renal failure with acute cortical necrosis (HCC)   Shock (HCC)   Status post tracheostomy ([place])   Endotracheal tube present   Hemodialysis status ([place])   Palliative care by specialist   SOB (shortness of breath)   Non-intractable vomiting   Abdominal pain, epigastric   Cerebral embolism with cerebral infarction   Acute esophagitis   Duodenal ulcer disease   Consultants: PCCM General surgery Nephrology Hematology/oncology Palliative care medicine team  Procedures: 2u PRBC 7/31, 1u PRBC 8/17, 1u PRBC 8/27 Tracheostomy revision 01/05/2021 Dr. Constance Holster [facility] 8/22    Antibiotics: Vancomycin 7/24 Cefepime 7/24 - 7/27 Unasyn 7/30 >> cefepime/flagyl 7/31 - 8/6     Time spent: 40 minutes    Darliss Cheney MD  [facility] Please refer to Amion for contact info 68  days

## 2021-02-07 NOTE — Progress Notes (Signed)
VASCULAR LAB    Arrived at patient's room for TCD, TCD with bubble, and Carotid duplex.  Patient being readied to go to dialysis.  Spoke with Dr. Erlinda Hong.  Will reschedule for tomorrow 10/2.   Lashona Schaaf, RVT 02/07/2021, 1:30 PM

## 2021-02-07 NOTE — Progress Notes (Addendum)
[facility] Progress Note   65 year old gentleman with a history of colon cancer status post resection and colostomy May 2022.  Status post 3 cycles of chemotherapy last was in July 2022.  Type 2 diabetes with neuropathy hypertension hyperlipidemia presented to the emergency room 11/30/2020 with abdominal pain.  Was found to be secondary to sepsis.  Patient also was found to have acute kidney injury.  Developed atrial fibrillation with rapid ventricular rate and was admitted to intensive care unit for septic shock.  On 12/03/2020 he developed metabolic encephalopathy with hypoxia and aspiration pneumonia and required intubation 12/06/2020.  He also required pressor support.  He was reintubated since 12/15/2020 tracheostomy placed.  His hospital course  complicated by DVT.  It appears that he has recurrent Pseudomonas pneumonia.  He also has recurrent anemia requiring transfusion of 4 units of packed red blood cells.  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 CRRT initiated 7/31 and transitioned to IHD on 12/19/20.  S/p LIJ TDC placement 12/22/20.  We will closely monitor for signs of recovery but urine output has been fairly minimal  We had planned to reschedule dialysis to Saturday but the patient underwent MRI with contrast so dialysis was performed just afterwards on 9/30.  Patient is getting dialysis again today to get on TTS schedule and ensure MRI contrast clearance.   Monitor for signs of recovery over the weekend.    2.Urinary retention   Foley catheter placed     3.Volume overload/anasarca-improved with dialysis.   4.Sepsis/aspiration pneumonia/VDRF:aspiration pneumonia.  Tracheostomy placed   4.Stage IIIB colon cancer - sp partial colectomy, colostomy. On chemoRx sp 3 cycles.   5.Right leg DVT: On Eliquis.   6.Atrial fibrillation - on amiodarone and anticoagulation.     7.Anemia  of critical illness: DC'ed  IV iron as concern for infection and on abx, transfuse as needed, holding erythropoietin with active malignancy.  Underwent EGD on 9/30.   8. CKD-MBD; started sevelamer for hyperphosphatemia on 9/8, monitor phosphorus level.--improved 4.3-6.3   9. Disposition - poor overall prognosis.  Seen by palliative care team.  Discussion ongoing for possible transfer to LTAC/[facility].  The tracheostomy should be capped before he can be accepted to [facility]  Subjective:   Patient states he feels well today.  Tolerated dialysis okay.  Did have problems clotting in circuit.  Avoided using heparin given possible GI bleeding.   Objective:   BP 117/85 (BP Location: Right Arm)   Pulse (!) 103   Temp 98 F (36.7 C) (Axillary)   Resp 16   Ht _0  (1.727 m)   Wt 98.7 kg   SpO2 100%   BMI 33.09 kg/m   Intake/Output Summary (Last 24 hours) at 02/07/2021 1012 Last data filed at 02/07/2021 0900 Gross per 24 hour  Intake 752.05 ml  Output 2008 ml  Net -1255.95 ml   Weight change: 0.9 kg  Physical Exam: General: Chronically ill-appearing, lying in bed, no distress Heart: Normal rate, no audible murmur Lungs: normal wob, bilateral chest rise Abdomen:soft, Non-tender, non-distended Extremities: Minimal edema, warm and well perfused Neurology: Alert awake and following commands  Dialysis Access: LIJ TDC in place.     Imaging: MR BRAIN WO CONTRAST  Result Date: 02/05/2021 CLINICAL DATA:  Neuro deficit, acute, stroke suspected EXAM: MRI HEAD WITHOUT CONTRAST TECHNIQUE: Multiplanar, multiecho pulse sequences of the brain and surrounding structures were obtained without intravenous  contrast. COMPARISON:  CT 12/05/2020.  MRI 04/10/2014. FINDINGS: Brain: Multiple punctate foci of restricted diffusion in bilateral frontal white matter (series 5, image 89). Mild associated edema without mass effect. Chronic encephalomalacia in the left frontal lobe secondary to resection of an  extra-axial mass (see MRI from 04/10/2014). Surrounding T2 hyperintensity, nonspecific but compatible with gliosis. No mass effect. The absence of contrast precludes evaluation for enhancement. 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 No hydrocephalus, midline shift, acute hemorrhage, or extra-axial fluid collection. Generalized atrophy. Vascular: Major arterial flow voids are maintained at the skull base. Skull and upper cervical spine: Normal marrow signal. Sinuses/Orbits: Mild paranasal sinus mucosal thickening. No acute orbital findings. Other: Moderate bilateral mastoid effusions. IMPRESSION: 1. Multiple punctate foci of restricted diffusion in bilateral frontal white matter, most likely acute infarcts. Given the patient's known history of colon cancer, consider postcontrast imaging to ensure these areas do not enhance and exclude metastatic disease. 2. Postsurgical left frontal encephalomalacia and craniotomy, described above. 3. Moderate bilateral mastoid effusions. Electronically Signed   By: Margaretha Sheffield M.D.   On: 02/05/2021 20:00   MR BRAIN W CONTRAST  Result Date: 02/06/2021 CLINICAL DATA:  Stroke follow-up. EXAM: MRI HEAD WITH CONTRAST TECHNIQUE: Multiplanar, multiecho pulse sequences of the brain and surrounding structures were obtained with intravenous contrast. CONTRAST:  81m GADAVIST GADOBUTROL 1 MMOL/ML IV SOLN COMPARISON:  MRI of the brain February 05, 2021 FINDINGS: Postcontrast images obtained and compared to recent MRI of the brain pre contrast images. Two of the foci of restricted diffusion previously described on prior MRI showed faint contrast enhancement, in the deep white matter of each frontal lobe (series 5, images 33 and 35). No other focus of abnormal contrast enhancement. IMPRESSION: Enhancement of 2 of the foci of restricted diffusion described on prior MRI. Differential diagnosis remains the same including infarct versus small metastatic disease given the acute/subacute  infarct may show contrast enhancement. A 6-8 week follow-up MRI with and without contrast is recommended. Electronically Signed   By: KPedro EarlsM.D.   On: 02/06/2021 13:21   DG Abd Portable 1V  Result Date: 02/06/2021 CLINICAL DATA:  Feeding tube placement EXAM: PORTABLE ABDOMEN - 1 VIEW COMPARISON:  Portable exam 1041 hours compared to 02/05/2021 FINDINGS: Tip of feeding tube projects over duodenal bulb/proximal descending duodenum. Paucity of bowel gas. Lung bases clear. Osseous structures unremarkable. IMPRESSION: Tip of feeding tube projects over duodenal bulb/proximal descending duodenum Electronically Signed   By: MLavonia DanaM.D.   On: 02/06/2021 13:36    Labs: BMET Recent Labs  Lab 02/02/21 0520 02/03/21 0647 02/04/21 0400 02/05/21 0244 02/05/21 2159 02/06/21 0434 02/07/21 0258  NA 136 135 135 135 136 139 137  K 3.7 3.4* 3.6 5.2* 4.2 4.3 3.7  CL 97* 98 95* 98 103 105 102  CO2 23 21* 25 21* 19* 20* 19*  GLUCOSE 124* 141* 146* 168* 185* 133* 134*  BUN 59* 79* 48* 62* 72* 72* 32*  CREATININE 5.24* 6.07* 4.86* 6.42* 6.84* 6.71* 4.22*  CALCIUM 9.6 9.4 9.3 9.5 9.0 9.0 9.0  PHOS 6.3* 6.1* 5.0* 5.2* 5.8* 6.3* 2.3*   CBC Recent Labs  Lab 02/03/21 0647 02/07/21 0952  WBC 18.7* 18.4*  HGB 8.5* 8.7*  HCT 25.4* 26.4*  MCV 85.5 85.4  PLT 381 336    Medications:     chlorhexidine gluconate (MEDLINE KIT)  15 mL Mouth Rinse BID   Chlorhexidine Gluconate Cloth  6 each Topical Q0600   enoxaparin (LOVENOX)  injection  90 mg Subcutaneous Q24H   feeding supplement (NEPRO CARB STEADY)  1,000 mL Per Tube Q24H   feeding supplement (PROSource TF)  45 mL Per Tube BID   hydrocerin   Topical BID   insulin aspart  0-15 Units Subcutaneous Q4H   insulin detemir  10 Units Subcutaneous Daily   mouth rinse  15 mL Mouth Rinse 10 times per day   nystatin  5 mL Mouth/Throat QID   ondansetron  4 mg Intravenous Q6H   pantoprazole (PROTONIX) IV  40 mg Intravenous Q12H    scopolamine  1 patch Transdermal Q72H     Shaune Pollack Steven Basso  02/07/2021, 10:12 AM

## 2021-02-07 NOTE — Progress Notes (Signed)
RN called d/t pt pulled out his trach.  Trach re-inserted w/ obturator w/ no apparent complications. Trach re-secured.  RN at bedside.  + easy cap color change, pt coughed up sputum, good air movement noted. Sat 96% on ATC.

## 2021-02-07 NOTE — Plan of Care (Signed)
  Problem: Activity: Goal: Risk for activity intolerance will decrease Outcome: Progressing   Problem: Elimination: Goal: Will not experience complications related to bowel motility Outcome: Progressing   Problem: Pain Managment: Goal: General experience of comfort will improve Outcome: Progressing   Problem: Safety: Goal: Ability to remain free from injury will improve Outcome: Progressing   

## 2021-02-07 NOTE — Progress Notes (Signed)
Pt has been has been agitated this shift. Reached out to on call MD for further interventions. See mar for details.

## 2021-02-07 NOTE — Progress Notes (Signed)
Treatment ended per patient request with 2 hours remaining due to Patient states  "not being able to take it" and "too tired to do it today due to a full day yesterday" MD Peeples notified. Vitals stable. Awaiting sodium citrate to dwell in catheter.

## 2021-02-08 ENCOUNTER — Inpatient Hospital Stay (HOSPITAL_COMMUNITY): Payer: Medicare HMO

## 2021-02-08 DIAGNOSIS — I639 Cerebral infarction, unspecified: Secondary | ICD-10-CM

## 2021-02-08 DIAGNOSIS — K209 Esophagitis, unspecified without bleeding: Secondary | ICD-10-CM | POA: Diagnosis not present

## 2021-02-08 DIAGNOSIS — E1100 Type 2 diabetes mellitus with hyperosmolarity without nonketotic hyperglycemic-hyperosmolar coma (NKHHC): Secondary | ICD-10-CM | POA: Diagnosis not present

## 2021-02-08 DIAGNOSIS — I4891 Unspecified atrial fibrillation: Secondary | ICD-10-CM | POA: Diagnosis not present

## 2021-02-08 DIAGNOSIS — I6340 Cerebral infarction due to embolism of unspecified cerebral artery: Secondary | ICD-10-CM | POA: Diagnosis not present

## 2021-02-08 LAB — RENAL FUNCTION PANEL
Albumin: 2.9 g/dL — ABNORMAL LOW (ref 3.5–5.0)
Anion gap: 13 (ref 5–15)
BUN: 28 mg/dL — ABNORMAL HIGH (ref 8–23)
CO2: 24 mmol/L (ref 22–32)
Calcium: 9 mg/dL (ref 8.9–10.3)
Chloride: 101 mmol/L (ref 98–111)
Creatinine, Ser: 4.13 mg/dL — ABNORMAL HIGH (ref 0.61–1.24)
GFR, Estimated: 15 mL/min — ABNORMAL LOW (ref >60)
Glucose, Bld: 165 mg/dL — ABNORMAL HIGH (ref 70–99)
Phosphorus: 4.2 mg/dL (ref 2.5–4.6)
Potassium: 3.4 mmol/L — ABNORMAL LOW (ref 3.5–5.1)
Sodium: 138 mmol/L (ref 135–145)

## 2021-02-08 LAB — GLUCOSE, CAPILLARY
Glucose-Capillary: 151 mg/dL — ABNORMAL HIGH (ref 70–99)
Glucose-Capillary: 204 mg/dL — ABNORMAL HIGH (ref 70–99)
Glucose-Capillary: 158 mg/dL — ABNORMAL HIGH (ref 70–99)
Glucose-Capillary: 195 mg/dL — ABNORMAL HIGH (ref 70–99)
Glucose-Capillary: 188 mg/dL — ABNORMAL HIGH (ref 70–99)

## 2021-02-08 LAB — HEPARIN ANTI-XA: Heparin LMW: 1.06 IU/mL

## 2021-02-08 LAB — MAGNESIUM: Magnesium: 1.8 mg/dL (ref 1.7–2.4)

## 2021-02-08 MED ORDER — ENOXAPARIN SODIUM 100 MG/ML IJ SOSY
100.0000 mg | PREFILLED_SYRINGE | INTRAMUSCULAR | Status: DC
  Administered 2021-02-08: 100 mg via SUBCUTANEOUS
  Filled 2021-02-08: qty 1

## 2021-02-08 MED ORDER — INFLUENZA VAC SPLIT QUAD 0.5 ML IM SUSY
0.5000 mL | PREFILLED_SYRINGE | INTRAMUSCULAR | Status: AC
  Administered 2021-02-08: 0.5 mL via INTRAMUSCULAR
  Filled 2021-02-08: qty 0.5

## 2021-02-08 NOTE — Progress Notes (Signed)
STROKE TEAM PROGRESS NOTE   INTERVAL HISTORY Wife is at bedside.  Patient awake alert, on trach with speaking valve.  Neuro stable.  Had a TCD bubble study which was negative.  Carotid Doppler negative.  Vitals:   02/08/21 1000 02/08/21 1125 02/08/21 1200 02/08/21 1551  BP: 130/75  136/72   Pulse: (!) 102 (!) 104 (!) 104 (!) 105  Resp: 20 20 18 20   Temp: 98.1 F (36.7 C)     TempSrc: Axillary     SpO2: 99% 99% 100% 100%  Weight:      Height:       CBC:  Recent Labs  Lab 02/03/21 0647 02/07/21 0952  WBC 18.7* 18.4*  HGB 8.5* 8.7*  HCT 25.4* 26.4*  MCV 85.5 85.4  PLT 381 638   Basic Metabolic Panel:  Recent Labs  Lab 02/07/21 0258 02/08/21 0500  NA 137 138  K 3.7 3.4*  CL 102 101  CO2 19* 24  GLUCOSE 134* 165*  BUN 32* 28*  CREATININE 4.22* 4.13*  CALCIUM 9.0 9.0  MG 1.8 1.8  PHOS 2.3* 4.2   Lipid Panel:  Recent Labs  Lab 02/06/21 0500  CHOL 115  TRIG 100  HDL 28*  CHOLHDL 4.1  VLDL 20  LDLCALC 67   HgbA1c:  Recent Labs  Lab 02/06/21 1420  HGBA1C 6.4*   Urine Drug Screen: No results for input(s): LABOPIA, COCAINSCRNUR, LABBENZ, AMPHETMU, THCU, LABBARB in the last 168 hours.  Alcohol Level No results for input(s): ETH in the last 168 hours.  IMAGING past 24 hours VAS [place] TRANSCRANIAL DOPPLER W BUBBLES  Result Date: 02/08/2021  Transcranial Doppler with Bubble Patient Name:  Thomas [name]  Date of Exam:   02/08/2021 Medical Rec #: [id number]     Accession #:    [id number] Date of Birth: March 30, 1956     Patient Gender: M Patient Age:   34 years Exam Location:  [facility] Procedure:      VAS [place] TRANSCRANIAL DOPPLER W BUBBLES Referring Phys: PRAMOD SETHI --------------------------------------------------------------------------------  Indications: Stroke. History: DVT found 12/12/20. Comparison Study: No prior study Performing Technologist: Sharion Dove RVS  Examination Guidelines: A complete evaluation includes B-mode imaging, spectral Doppler, color  Doppler, and power Doppler as needed of all accessible portions of each vessel. Bilateral testing is considered an integral part of a complete examination. Limited examinations for reoccurring indications may be performed as noted.  Summary: No HITS at rest or during Valsalva. Negative transcranial Doppler Bubble study with no evidence of right to left intracardiac communication.  A vascular evaluation was performed. The left middle cerebral artery was studied. An IV was inserted into the patient's right central line. Verbal informed consent was obtained.  *See table(s) above for TCD measurements and observations.    Preliminary    VAS US CAROTID  Result Date: 02/08/2021 Carotid Arterial Duplex Study Patient Name:  Thomas [name]  Date of Exam:   02/08/2021 Medical Rec #: [id number]     Accession #:    [id number] Date of Birth: 01/13/1956     Patient Gender: M Patient Age:   83 years Exam Location:  [facility] Procedure:      VAS US CAROTID Referring Phys: PRAMOD SETHI --------------------------------------------------------------------------------  Indications:       CVA. Risk Factors:      Hypertension, hyperlipidemia, Diabetes, past history of                    smoking.  Other Factors:     Atrial fibrillation, DVT, on Eliquis. Obstructive sleep                    apnea. Limitations        Today's exam was limited due to trach collar, central line,                    dialysis catheter, coughing. Comparison Study:  No prior study on file Performing Technologist: Sharion Dove RVS  Examination Guidelines: A complete evaluation includes B-mode imaging, spectral Doppler, color Doppler, and power Doppler as needed of all accessible portions of each vessel. Bilateral testing is considered an integral part of a complete examination. Limited examinations for reoccurring indications may be performed as noted.  Right Carotid Findings: +----------+--------+--------+--------+------------------+------------------+            PSV cm/sEDV cm/sStenosisPlaque DescriptionComments           +----------+--------+--------+--------+------------------+------------------+ CCA Prox  65      20                                intimal thickening +----------+--------+--------+--------+------------------+------------------+ CCA Distal49      19                                intimal thickening +----------+--------+--------+--------+------------------+------------------+ ICA Prox  61      27                                                   +----------+--------+--------+--------+------------------+------------------+ ICA Distal88      43                                                   +----------+--------+--------+--------+------------------+------------------+ ECA       66      11                                                   +----------+--------+--------+--------+------------------+------------------+ +----------+--------+-------+------------+-------------------+           PSV cm/sEDV cmsDescribe    Arm Pressure (mmHG) +----------+--------+-------+------------+-------------------+ Subclavian               Not assessed                    +----------+--------+-------+------------+-------------------+ +---------+--------+--------+------------+ VertebralPSV cm/sEDV cm/sNot assessed +---------+--------+--------+------------+  Left Carotid Findings: +----------+--------+--------+--------+------------------+------------------+           PSV cm/sEDV cm/sStenosisPlaque DescriptionComments           +----------+--------+--------+--------+------------------+------------------+ CCA Prox  58      21                                intimal thickening +----------+--------+--------+--------+------------------+------------------+ CCA Distal75      21  intimal thickening +----------+--------+--------+--------+------------------+------------------+  ICA Prox  73      22              heterogenous                         +----------+--------+--------+--------+------------------+------------------+ ICA Distal73      30                                                   +----------+--------+--------+--------+------------------+------------------+ ECA       14                                                           +----------+--------+--------+--------+------------------+------------------+ +----------+--------+--------+------------+-------------------+           PSV cm/sEDV cm/sDescribe    Arm Pressure (mmHG) +----------+--------+--------+------------+-------------------+ Subclavian                Not assessed                    +----------+--------+--------+------------+-------------------+ +---------+--------+--+--------+--+---------+ VertebralPSV cm/s63EDV cm/s22Antegrade +---------+--------+--+--------+--+---------+   Summary: Right Carotid: The extracranial vessels were near-normal with only minimal wall                thickening or plaque. Left Carotid: The extracranial vessels were near-normal with only minimal wall               thickening or plaque. Vertebrals:  Left vertebral artery demonstrates antegrade flow. Right vertebral              artery was not visualized. Subclavians: Bilateral subclavian arteries were not visualized. *See table(s) above for measurements and observations.     Preliminary    VAS [place] TRANSCRANIAL DOPPLER  Result Date: 02/08/2021  Transcranial Doppler Patient Name:  Thomas [name]  Date of Exam:   02/08/2021 Medical Rec #: [id number]     Accession #:    [id number] Date of Birth: 11-08-1955     Patient Gender: M Patient Age:   89 years Exam Location:  [facility] Procedure:      VAS [place] TRANSCRANIAL DOPPLER Referring Phys: PRAMOD SETHI --------------------------------------------------------------------------------  Indications: Stroke. History: DVT found 8/22. Limitations: trach  Comparison Study: No prior study Performing Technologist: Sharion Dove RVS  Examination Guidelines: A complete evaluation includes B-mode imaging, spectral Doppler, color Doppler, and power Doppler as needed of all accessible portions of each vessel. Bilateral testing is considered an integral part of a complete examination. Limited examinations for reoccurring indications may be performed as noted.  +----------+-------------+----------+-----------+------------------+ RIGHT TCD Right VM (cm)Depth (cm)Pulsatility     Comment       +----------+-------------+----------+-----------+------------------+ MCA           81.00                 0.92                       +----------+-------------+----------+-----------+------------------+ ACA  unable to insonate +----------+-------------+----------+-----------+------------------+ Term ICA      55.00                 1.08                       +----------+-------------+----------+-----------+------------------+ PCA           21.00                 0.83                       +----------+-------------+----------+-----------+------------------+ Opthalmic     17.00                 1.57                       +----------+-------------+----------+-----------+------------------+ ICA siphon                                  unable to insonate +----------+-------------+----------+-----------+------------------+ Vertebral                                         trach        +----------+-------------+----------+-----------+------------------+ Distal ICA                                        trach        +----------+-------------+----------+-----------+------------------+  +----------+------------+----------+-----------+-------+ LEFT TCD  Left VM (cm)Depth (cm)PulsatilityComment +----------+------------+----------+-----------+-------+ MCA          95.00                 1.17             +----------+------------+----------+-----------+-------+ ACA          -35.00                1.17            +----------+------------+----------+-----------+-------+ Term ICA     101.00                1.18            +----------+------------+----------+-----------+-------+ PCA          13.00                 0.94            +----------+------------+----------+-----------+-------+ Opthalmic    34.00                  1.5            +----------+------------+----------+-----------+-------+ ICA siphon   41.00                 0.81            +----------+------------+----------+-----------+-------+ Vertebral                                   trach  +----------+------------+----------+-----------+-------+ Distal ICA                                  trach  +----------+------------+----------+-----------+-------+  +------------+-----+-------+  VM cmComment +------------+-----+-------+ Prox Basilar      trach  +------------+-----+-------+    Preliminary     PHYSICAL EXAM Frail cachectic middle-aged African-American male status post tracheostomy. . Afebrile. Head is nontraumatic. Neck is supple without bruit.    Cardiac exam no murmur or gallop. Lungs are clear to auscultation. Distal pulses are well felt.  Neurological Exam:  Awake alert oriented to time place and person.  Status post tracheostomy with Passy-Muir valve.  Able to speak with slight dysarthria and hypophonia.  Good comprehension and naming.  Extraocular movements full range without nystagmus.  Blinks to threat bilaterally.  Mild right nasolabial fold flattening.  Not able to stick out tongue but able to move it slightly up and down inside mouth.  Able to move all 4 extremities well against gravity without lateralized weakness.  Sensation intact bilaterally.  Finger-to-nose bilaterally intact.   ASSESSMENT/PLAN Thomas [name] is a 65 y.o. male with a medical history significant for essential  hypertension, hyperlipidemia, epilepsy on Keppra, type 2 diabetes mellitus with peripheral neuropathy, frontal meningioma s/p craniotomy 2018, GERD, MCA stroke, stage 3 colon cancer s/p partial colectomy and end colostomy on 09/10/2020, and chemotherapy with 3rd cycle in July 2022 who presented to the ED 7/24 for evaluation of abdominal pain with fever, tachycardia, tachypnea, elevated lactate was found to be septic.  While in the ED patient developed atrial fibrillation with RVR and was admitted to the ICU for septic shock with unclear source, however it is felt that sepsis was unlikely at the time of admission and that presentation was more likely related to cepacitabine toxicity with oral mucositis and hand/foot syndrome. He has had a prolonged hospitalization of 67 days with concern for sepsis versus capacitabine toxicity with oral mucositis and hand/foot syndrome with ongoing tongue paresis affecting his ability to speak and swallow. Examination reveals patient with tongue paresis, speech assisted via PMSV, and oral thrush.  Patient's soft palate elevates symmetrically.  Patient does have BLE weakness likely due to deconditioning from prolonged hospitalization.   Tongue weakness, improving exact etiology unclear  Could be due to mucositis, oral thrush, hand/foot syndrome  Per Dr. Leonie Man "possibly mechanical injury during repeat intubation though possible, bilateral hypoglossal nerve palsies resulting in tongue paresis are rare in isolation. This could be the result of laryngoscopy with multiple intubations with possible hypoglossal nerve injury however, patient does not have complete paresis as he retains some lingual retraction and elevation on examination".    Stroke, incidental finding - 3-4 punctate infarcts in bilateral frontal watershed area.  DDx including hypoperfusion in setting of sepsis, Afib RVR, paradoxical emboli given acute DVT if he has a PFO, or endocarditis however blood culture  negative. MRI brain  - Multiple punctate foci of restricted diffusion in bilateral frontal white matter, most likely acute infarcts.  MRI with contrast Enhancement of 2 of the foci of restricted diffusion described on prior MRI. Differential diagnosis remains the same including infarct versus small metastatic disease given the acute/subacute infarct may show contrast enhancement. A 6-8 week follow-up MRI with and without contrast is recommended. TCD pending TCD bubble study no PFO Carotid Doppler unremarkable 2D Echo 12/02/2020 EF 60 to 65%  LE venous doppler right DVT  LDL 67 HgbA1c 6.4 VTE prophylaxis - Lovenox No antiplatelet or antithrombotic prior to admission, now on lovenox 90mg  QD.  Continue Eliquis     Therapy recommendations:  SNF Disposition:  TBD  Atrial fibrillation with RVR Rate controlled On Eliquis -> lovenox 90mg   QD  RLE DVT  On Eliquis -> lovenox 90mg  QD TCD bubble study no PFO  Hypertension Stable  Hyperlipidemia Home meds:  lipitor 20mg , continue home dose  LDL 67, at goal < 70 Resume Lipitor once p.o. access Continue statin at discharge  Diabetes type II Controlled HgbA1c 6.4, goal < 7.0 CBGs SSI Management per primary team Close follow up with PCP  Other Stroke Risk Factors Former Cigarette smoker Obesity, Body mass index is 32.82 kg/m., BMI >/= 30 associated with increased stroke risk, recommend weight loss, diet and exercise as appropriate Obstructive sleep apnea, uncertain if on CPAP at home  Other Active Problems Sepsis - management per primary team History of epilepsy on Keppra - EEG negative for seizure - continue Keppra Frontal meningioma status postsurgery in 2018 Colon cancer postsurgery and colostomy 09/2020, on chemo AKI on CRRT/HD  Hospital day # 69  Neurology will sign off. Please call with questions. Pt will follow up with stroke clinic NP at [facility] in about 4 weeks. Thanks for the consult.   Rosalin Hawking, MD PhD Stroke  Neurology 02/08/2021 6:32 PM  To contact Stroke Continuity provider, please refer to [email or web address] After hours, contact General Neurology

## 2021-02-08 NOTE — Progress Notes (Signed)
[name] Progress Note   65 year old gentleman with a history of colon cancer status post resection and colostomy May 2022.  Status post 3 cycles of chemotherapy last was in July 2022.  Type 2 diabetes with neuropathy hypertension hyperlipidemia presented to the emergency room 11/30/2020 with abdominal pain.  Was found to be secondary to sepsis.  Patient also was found to have acute kidney injury.  Developed atrial fibrillation with rapid ventricular rate and was admitted to intensive care unit for septic shock.  On 12/03/2020 he developed metabolic encephalopathy with hypoxia and aspiration pneumonia and required intubation 12/06/2020.  He also required pressor support.  He was reintubated since 12/15/2020 tracheostomy placed.  His hospital course  complicated by DVT.  It appears that he has recurrent Pseudomonas pneumonia.  He also has recurrent anemia requiring transfusion of 4 units of packed red blood cells.  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 CRRT initiated 7/31 and transitioned to IHD on 12/19/20.  S/p LIJ TDC placement 12/22/20.  We will closely monitor for signs of recovery but urine output has been fairly minimal  Short session of dialysis yesterday.  Maintain TTS schedule for now.   Monitor for signs of recovery but seems unlikely at this point    2.Urinary retention   Foley catheter placed     3.Volume overload/anasarca-improved with dialysis.   4.Sepsis/aspiration pneumonia/VDRF:aspiration pneumonia.  Tracheostomy placed   4.Stage IIIB colon cancer - sp partial colectomy, colostomy. On chemoRx sp 3 cycles.   5.Right leg DVT: On Eliquis.   6.Atrial fibrillation - on amiodarone and anticoagulation.     7.Anemia of critical illness: DC'ed  IV iron as concern for infection and on abx, transfuse as needed, holding erythropoietin with active malignancy.  Underwent EGD on  9/30.   8. CKD-MBD; started sevelamer for hyperphosphatemia on 9/8, monitor phosphorus level.--improved 4.3-6.3   9. Disposition - poor overall prognosis.  Seen by palliative care team.  Discussion ongoing for possible transfer to [facility].  The tracheostomy should be capped before he can be accepted to [facility]  Subjective:   Patient feels okay today.  Pretty worn out from dialysis.  Denies any other complaints   Objective:   BP 133/82 (BP Location: Right Arm)   Pulse (!) 106   Temp 98.7 F (37.1 C) (Oral)   Resp (!) 21   Ht 5' 8"  (1.727 m)   Wt 97.9 kg   SpO2 100%   BMI 32.82 kg/m   Intake/Output Summary (Last 24 hours) at 02/08/2021 1914 Last data filed at 02/07/2021 1551 Gross per 24 hour  Intake --  Output 886 ml  Net -886 ml   Weight change: -3.3 kg  Physical Exam: General: Chronically ill-appearing, lying in bed, no distress Heart: Normal rate Lungs: normal wob, bilateral chest rise Abdomen:soft, Non-tender, non-distended Extremities: Minimal edema, warm and well perfused Neurology: Alert awake and following commands  Dialysis Access: LIJ TDC in place.     Imaging: MR BRAIN W CONTRAST  Result Date: 02/06/2021 CLINICAL DATA:  Stroke follow-up. EXAM: MRI HEAD WITH CONTRAST TECHNIQUE: Multiplanar, multiecho pulse sequences of the brain and surrounding structures were obtained with intravenous contrast. CONTRAST:  20m GADAVIST GADOBUTROL 1 MMOL/ML IV SOLN COMPARISON:  MRI of the brain February 05, 2021 FINDINGS: Postcontrast images obtained and compared to recent MRI of the brain pre contrast images. Two of the foci of restricted diffusion previously  described on prior MRI showed faint contrast enhancement, in the deep white matter of each frontal lobe (series 5, images 33 and 35). No other focus of abnormal contrast enhancement. IMPRESSION: Enhancement of 2 of the foci of restricted diffusion described on prior MRI. Differential diagnosis remains the same including  infarct versus small metastatic disease given the acute/subacute infarct may show contrast enhancement. A 6-8 week follow-up MRI with and without contrast is recommended. Electronically Signed   By: Pedro Earls M.D.   On: 02/06/2021 13:21   DG Abd Portable 1V  Result Date: 02/06/2021 CLINICAL DATA:  Feeding tube placement EXAM: PORTABLE ABDOMEN - 1 VIEW COMPARISON:  Portable exam 1041 hours compared to 02/05/2021 FINDINGS: Tip of feeding tube projects over duodenal bulb/proximal descending duodenum. Paucity of bowel gas. Lung bases clear. Osseous structures unremarkable. IMPRESSION: Tip of feeding tube projects over duodenal bulb/proximal descending duodenum Electronically Signed   By: Lavonia Dana M.D.   On: 02/06/2021 13:36    Labs: BMET Recent Labs  Lab 02/03/21 0647 02/04/21 0400 02/05/21 0244 02/05/21 2159 02/06/21 0434 02/07/21 0258 02/08/21 0500  NA 135 135 135 136 139 137 138  K 3.4* 3.6 5.2* 4.2 4.3 3.7 3.4*  CL 98 95* 98 103 105 102 101  CO2 21* 25 21* 19* 20* 19* 24  GLUCOSE 141* 146* 168* 185* 133* 134* 165*  BUN 79* 48* 62* 72* 72* 32* 28*  CREATININE 6.07* 4.86* 6.42* 6.84* 6.71* 4.22* 4.13*  CALCIUM 9.4 9.3 9.5 9.0 9.0 9.0 9.0  PHOS 6.1* 5.0* 5.2* 5.8* 6.3* 2.3* 4.2   CBC Recent Labs  Lab 02/03/21 0647 02/07/21 0952  WBC 18.7* 18.4*  HGB 8.5* 8.7*  HCT 25.4* 26.4*  MCV 85.5 85.4  PLT 381 336    Medications:     chlorhexidine gluconate (MEDLINE KIT)  15 mL Mouth Rinse BID   Chlorhexidine Gluconate Cloth  6 each Topical Q0600   enoxaparin (LOVENOX) injection  100 mg Subcutaneous Q24H   feeding supplement (NEPRO CARB STEADY)  1,000 mL Per Tube Q24H   feeding supplement (PROSource TF)  45 mL Per Tube BID   hydrocerin   Topical BID   influenza vac split quadrivalent PF  0.5 mL Intramuscular Tomorrow-1000   insulin aspart  0-15 Units Subcutaneous Q4H   insulin detemir  10 Units Subcutaneous Daily   mouth rinse  15 mL Mouth Rinse 10 times per  day   nystatin  5 mL Mouth/Throat QID   ondansetron  4 mg Intravenous Q6H   pantoprazole (PROTONIX) IV  40 mg Intravenous Q12H   scopolamine  1 patch Transdermal Q72H     Reesa Chew  02/08/2021, 8:32 AM

## 2021-02-08 NOTE — Progress Notes (Signed)
[facility] PROGRESS NOTE  Thomas [name] XLK:[id number] DOB: Nov 15, 1955 DOA: 11/30/2020 PCP: [name], NP  Status: Remains inpatient appropriate because:Persistent severe electrolyte disturbances, Unsafe d/c plan, and Inpatient level of care appropriate due to severity of illness  Dispo: The patient is from: Home              Anticipated d/c is to: SNF versus LTAC vs CIR              Patient currently is not medically stable to d/c.   Difficult to place patient Yes               Barriers to discharge: Currently has tracheostomy in place and is requiring dialysis but remains too weak to tolerate dialysis in chair which is a requirement for outpatient HD    Level of care: Progressive  Code Status: Full Family Communication: Attending physician spoke with wife at bedside 9/26 DVT prophylaxis: Eliquis COVID vaccination status: Moderna 07/08/2019 and 08/11/2019 with first booster dose given 08/05/2020   HPI:  65 y.o. male with a history of T2DM with neuropathy, HTN, HLD., colon cancer s/p resection and colostomy May 2022 s/p 3 cycles chemotherapy (last was July 2022). Followed by Dr. Benay Spice. Started CAPOX June 2022, reduced cepecitabine dose starting 11/19/2020 due to hand/foot syndrome, reduced oxaliplatin due to renal impairment and poor performance status. Presented to the ED 7/24 with abdominal pain found to be febrile, tachycardic, tachypneic. Lactic acid was 6.6 and persisted at 6.8 after sepsis IV fluid bolus and cefepime. He had ARF, demand ischemia, bandemia, was hyperglycemic with negative ketones, with lactic acidosis. He developed AFib with RVR in the ED, was started on diltiazem, and was admitted to the ICU for septic shock with unclear source. Sepsis was subsequently felt to be ruled out at time of admission, presentation felt more likely to be related to cepacitabine toxicity with oral mucositis, hand/foot syndrome, and diarrhea/high output colostomy.   He was transferred  to hospitalist service 7/27 but developed metabolic encephalopathy with hyoxia due to aspiration pneumonia on 7/30 requiring intubation and pressor support. Failed extubation with inability to clear secretions, requiring reintubation 8/8, and tracheostomy subsequently placed 8/11. This was pulled out 8/22, replaced under bronch guidance, again removed 8/25 requiring reintubation after failed reinsertion. ENT revised tracheostomy in the OR 8/29. He has required IHD for ARF, and also had complications of RLE DVT now on eliquis, colitis thought to be ischemic 8/2 not requiring specific intervention, Pseudomonas pneumonia having completed treatment, agitated encephalopathy which has improved, anemia requiring 4u PRBCs total, and thrombocytopenia which has resolved. On 9/4 he was transferred to hospitalist service having tolerated trach collar.  Patient was also evaluated by Dr. Benay Spice during this admission clarified that patient was receiving curative chemotherapy prior to admission  Subjective: Seen and examined.  He states that he is feeling better than yesterday.  No new complaint.  Nursing reported that patient accidentally pulled his dialysis and right anterior chest port to some degree.  He was seen by Dr. Osborne Casco of nephrology and dialysis catheter looks good.  He was also seen by IV team and they were able to access the port on the right chest as well.  They are both functioning well.  Objective: Vitals:   02/08/21 1000 02/08/21 1125  BP: 130/75   Pulse: (!) 102 (!) 104  Resp: 20 20  Temp: 98.1 F (36.7 C)   SpO2: 99% 99%    Intake/Output Summary (Last 24 hours) at 02/08/2021  [place] filed at 02/07/2021 1551 Gross per 24 hour  Intake --  Output 561 ml  Net -561 ml    Filed Weights   02/07/21 1402 02/07/21 1645 02/08/21 0332  Weight: 97.7 kg 97.2 kg 97.9 kg    Exam: General exam: Appears calm and comfortable  Respiratory system: Slightly coarse breath sounds. Respiratory  effort normal.  Tracheostomy in place Cardiovascular system: S1 & S2 heard, RRR. No JVD, murmurs, rubs, gallops or clicks. No pedal edema. Gastrointestinal system: Abdomen is nondistended, soft and nontender. No organomegaly or masses felt. Normal bowel sounds heard. Central nervous system: Alert and oriented. No focal neurological deficits. Extremities: Symmetric 5 x 5 power. Skin: No rashes, lesions or ulcers.  Psychiatry: Judgement and insight appear normal. Mood & affect appropriate.    Assessment/Plan: Acute problems:  Acute hypoxic respiratory failure s/p tracheostomy due to aspiration pneumonia  Recurrent pansensitive Pseudomonas tracheobronchitis -Tolerating PMV.  Trach team wants to make sure patient more mobile with stronger vocalization before trying capping trials. -Continue Robinul  -SLP following for PMV training.  Grade LA C reflux esophagitis/nonbleeding duodenal ulcers -EGD 9/30 with the above findings -H. pylori pending -Recommendation is to advance diet as tolerated for will start previous tube feeding at 30 cc/h- if tolerates for 24 hours recommend advance to recommended rate of 55 cc/h -Patient needs to continue PPI for total of 4-weeks -If has refractory nausea can give Reglan -Surgical team has evaluated the patient and finds no acute surgical issues to explain his symptoms; sx's more c/w functional motility issues -Keep K > 4.0 and Mg > 2.0--follow labs -Continue empiric IV Protonix twice daily and IV Pepcid twice daily tolerates slow advance of tube feedings can transition medications back to oral -Follow-up EGD recommended after discharge  Funguria -Diflucan completed 9/27  Thrush -Continue nystatin  Inability to elevate tongue/MRI with bilateral punctate lesion -SLP evaluated and noted patient is unable to elevate tongue properly bilaterally-continues to have difficulty with swallowing and phonation -Neurology ordered noncontrasted MRI that revealed  multiple punctate foci of restricted diffusion in bilateral frontal white matter most likely acute infarcts.  This unexpected finding was discussed with Dr. Leonel Ramsay who agrees this is most likely an infarct and will have stroke team rounded on him today. -Radiologist recommended MRI with contrast to include postcontrast images to ensure these areas are not metastasis given his history of colon cancer -Patient currently on full dose Lovenox, statin currently on hold due to recent GI issues, Hgba1c 7/25 was 8.7; lipid panel today within appropriate ranges although HDL cholesterol is low LDL cholesterol is on -Stroke team has ordered carotid ultrasound, transcranial Doppler.  All of those are pending.  Neurology following. -MRI with contrast with postcontrast review of 2 punctate images -unable to differentiate whether related to infarct or small metastasis.  Recommendation is follow-up MRI in 6 to 8 weeks with and without contrast.   Urinary retention -We will continue Foley for now while nephrology trying to determine if patient is having urinary output consistently without dialysis.   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   - Continue HD TTS -Initially not expected to recover renal function -nephrology now documenting that he may be headed towards renal recovery.  Patient received dialysis on 02/06/2021.  Next dialysis Monday.  Appreciate nephrology help.   High output ostomy, diarrhea:  -Infectious work-up negative -Recent PSBO likely caused by Imodium and discontinued  Anemia of critical illness, acute blood loss as well as anemia of chronic disease and iron deficiency:  - s/p 4u PRBCs this hospitalization  - IV iron TTS x8 doses per nephrology. -Current hemoglobin stable at 10.6 with normal platelets   Stage IIIb colon CA s/p partial transverse colectomy with end colostomy May 2022:  - Palliative care has been  involved during this hospitalization- goals remain clear: full aggressive measures desired.  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   Uncontrolled T2DM on OHAs prior to admission with associated neuropathy:  HbA1c 8.7% at admission. - Continue levemir and SSI  -Gabapentin on hold due to recurrent nausea and vomiting   HTN:  -Norvasc on hold secondary to N/V.  Blood pressure fairly controlled.   HLD:  -Atorvastatin on hold due to nausea and vomiting   Thrombocytopenia:  -Transient likely due to critical illness. HIT Ab negative on 8/5.    ?Sepsis - initially thought to be present on admission- has been ruled out.    Pressure Injury 02/05/21 Coccyx Lower Stage 2 -  Partial thickness loss of dermis presenting as a shallow open injury with a red, pink wound bed without slough. healing quarter sized spot on sacrum (Active)  Date First Assessed/Time First Assessed: 02/05/21 0800   Location: Coccyx  Location Orientation: Lower  Staging: Stage 2 -   Partial thickness loss of dermis presenting as a shallow open injury with a red, pink wound bed without slough.  Wound Descript...    Assessments 02/05/2021  5:10 PM 02/07/2021  7:26 PM  Dressing Type Foam - Lift dressing to assess site every shift Foam - Lift dressing to assess site every shift  Dressing Changed Clean;Dry;Intact  State of Healing Early/partial granulation Early/partial granulation  Site / Wound Assessment Pink Pink  % Wound base Red or Granulating 100% --  Wound Length (cm) 2 cm --  Wound Width (cm) 2 cm --  Wound Surface Area (cm^2) 4 cm^2 --  Margins Attached edges (approximated) --  Drainage Amount None --  Treatment Cleansed --     No Linked orders to display     Data Reviewed: Basic Metabolic Panel: Recent Labs  Lab 02/04/21 0400 02/05/21 0244 02/05/21 2159 02/06/21 0434 02/07/21 0258 02/08/21 0500  NA 135 135 136 139 137 138  K 3.6 5.2* 4.2 4.3 3.7 3.4*  CL 95* 98 103 105 102 101  CO2 25 21* 19* 20* 19* 24  GLUCOSE 146* 168* 185* 133* 134* 165*  BUN 48* 62* 72* 72* 32* 28*  CREATININE 4.86* 6.42* 6.84* 6.71* 4.22* 4.13*  CALCIUM 9.3 9.5 9.0 9.0 9.0 9.0  MG 2.0 2.0  --  1.9 1.8 1.8  PHOS 5.0* 5.2* 5.8* 6.3* 2.3* 4.2    Liver Function Tests: Recent Labs  Lab 02/05/21 0244 02/05/21 2159 02/06/21 0434 02/07/21 0258 02/08/21 0500  ALBUMIN 3.1* 2.7* 2.6* 2.8* 2.9*     CBC: Recent Labs  Lab 02/03/21 0647 02/07/21 0952  WBC 18.7* 18.4*  HGB 8.5* 8.7*  HCT 25.4* 26.4*  MCV 85.5 85.4  PLT 381 336     CBG: Recent Labs  Lab 02/07/21 1939 02/07/21 2318 02/08/21 0331 02/08/21 0800 02/08/21 1141  GLUCAP 122* 119* 151* 204* 158*     Scheduled Meds:  chlorhexidine gluconate (MEDLINE KIT)  15 mL Mouth Rinse BID   Chlorhexidine Gluconate Cloth  6 each Topical Q0600   enoxaparin (LOVENOX) injection  100 mg Subcutaneous Q24H   feeding supplement (NEPRO CARB STEADY)  1,000 mL Per Tube Q24H   feeding supplement (PROSource TF)  45 mL  Per Tube BID   hydrocerin   Topical BID   influenza vac split quadrivalent PF  0.5 mL Intramuscular Tomorrow-1000   insulin aspart  0-15 Units Subcutaneous Q4H   insulin detemir  10 Units Subcutaneous Daily   mouth rinse  15 mL Mouth Rinse 10 times per day   nystatin  5 mL Mouth/Throat QID   ondansetron  4 mg Intravenous Q6H   pantoprazole (PROTONIX) IV  40 mg Intravenous Q12H   scopolamine  1 patch Transdermal Q72H   Continuous Infusions:  sodium chloride Stopped (12/20/20 0955)   anticoagulant sodium citrate     dextrose 5 % and 0.9% NaCl 30 mL/hr at 02/06/21 1830   famotidine (PEPCID) IV 20 mg (02/07/21 1253)   levETIRAcetam 500 mg (02/08/21 0854)   promethazine (PHENERGAN) injection (IM or IVPB) 12.5 mg (02/05/21 0416)    Principal Problem:   Diabetes mellitus with hyperosmolarity without hyperglycemic hyperosmolar nonketotic coma (HCC) Active Problems:   Lactic acidosis   Acute renal failure superimposed on stage 2 chronic kidney disease (HCC)   Metabolic acidosis   Tachycardia   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 (HCC)   Adverse effect of chemotherapy   Pressure injury of skin   Atrial fibrillation with RVR (HCC)   Sepsis (HCC)   Malnutrition of moderate degree   Acute metabolic encephalopathy   Acute respiratory failure with hypoxemia (HCC)   Acute renal failure with acute cortical necrosis (HCC)   Shock (HCC)   Status post tracheostomy ([place])   Endotracheal tube present   Hemodialysis status ([place])   Palliative care by specialist   SOB (shortness of breath)   Non-intractable vomiting   Abdominal pain, epigastric   Cerebral embolism with cerebral infarction   Acute esophagitis   Duodenal ulcer disease   Consultants: PCCM General surgery Nephrology Hematology/oncology Palliative care medicine team  Procedures: 2u PRBC 7/31, 1u PRBC 8/17, 1u PRBC 8/27 Tracheostomy revision 01/05/2021 Dr. Constance Holster [facility] 8/22     Antibiotics: Vancomycin 7/24 Cefepime 7/24 - 7/27 Unasyn 7/30 >> cefepime/flagyl 7/31 - 8/6   Time spent: 27 minutes Darliss Cheney MD  [facility] Please refer to Amion for contact info 69  days

## 2021-02-08 NOTE — Progress Notes (Signed)
Tameshia, LPN called for assistance in room.  Patient had pulled out port access and pulled at dialysis access, removing dressing.  Paged IV team to reaccess port and Dr. Osborne Casco informed while on unit.  Per Dr. Osborne Casco just redress dialysis access.

## 2021-02-08 NOTE — Progress Notes (Signed)
Influenza Vaccine given. Lot number: [id number] Expiration date: 11/06/21 Manufacturer: [employer]

## 2021-02-08 NOTE — Progress Notes (Signed)
ANTICOAGULATION CONSULT NOTE - Follow Up Consult  Pharmacy Consult for Lovenox Indication: DVT  Allergies  Allergen Reactions   Morphine And Related Other (See Comments)    Throat swelling    Patient Measurements: Height: 5' 8"  (172.7 cm) Weight: 97.9 kg (215 lb 13.3 oz) IBW/kg (Calculated) : 68.4  Vital Signs: Temp: 98.7 F (37.1 C) (10/02 0332) Temp Source: Oral (10/02 0332) BP: 133/82 (10/02 0332) Pulse Rate: 100 (10/01 2320)  Labs: Recent Labs    02/06/21 0434 02/07/21 0258 02/07/21 0952 02/08/21 0500  HGB  --   --  8.7*  --   HCT  --   --  26.4*  --   PLT  --   --  336  --   HEPRLOWMOCWT  --   --   --  1.06  CREATININE 6.71* 4.22*  --  4.13*    Estimated Creatinine Clearance: 20.5 mL/min (A) (by C-G formula based on SCr of 4.13 mg/dL (H)).   Medications:  Scheduled:   chlorhexidine gluconate (MEDLINE KIT)  15 mL Mouth Rinse BID   Chlorhexidine Gluconate Cloth  6 each Topical Q0600   enoxaparin (LOVENOX) injection  90 mg Subcutaneous Q24H   feeding supplement (NEPRO CARB STEADY)  1,000 mL Per Tube Q24H   feeding supplement (PROSource TF)  45 mL Per Tube BID   hydrocerin   Topical BID   influenza vac split quadrivalent PF  0.5 mL Intramuscular Tomorrow-1000   insulin aspart  0-15 Units Subcutaneous Q4H   insulin detemir  10 Units Subcutaneous Daily   mouth rinse  15 mL Mouth Rinse 10 times per day   nystatin  5 mL Mouth/Throat QID   ondansetron  4 mg Intravenous Q6H   pantoprazole (PROTONIX) IV  40 mg Intravenous Q12H   scopolamine  1 patch Transdermal Q72H   Infusions:   sodium chloride Stopped (12/20/20 0955)   anticoagulant sodium citrate     dextrose 5 % and 0.9% NaCl 30 mL/hr at 02/06/21 1830   famotidine (PEPCID) IV 20 mg (02/07/21 1253)   levETIRAcetam 500 mg (02/07/21 2211)   promethazine (PHENERGAN) injection (IM or IVPB) 12.5 mg (02/05/21 0416)    Assessment: Pt diagnosed with new RLE DVT on 8/5 and was started on apixaban for treatment.  On 9/22, patient was switched to SQ heparin for prophylaxis for unknown reason. On 9/24, patient was reinitiated on apixaban, however, on 9/28 he had GI obstructive type symptoms and had to be switched to therapeutic dosed enoxaparin. He was put on 90 mg q24h and after his 4th dose his anti-xa level was 1.06, plts 336, and Hgb 8.7. His CrCl is 20.5.  Goal of Therapy:  Anti-Xa (LMWH) level 1-2 units/mL Monitor platelets by anticoagulation protocol: Yes   Plan:  Increase Lovenox to 100 mg q24h Check anti-Xa levels as necessary and CBC daily Monitor for signs and symptoms of bleeding Follow-up possible transition to [place], PharmD PGY1 Pharmacy Resident  Please check AMION for all [facility] pharmacy phone numbers After 10:00 PM call main pharmacy [phone]

## 2021-02-08 NOTE — Progress Notes (Signed)
VASCULAR LAB    TCD has been performed.  See CV proc for preliminary results.   Berenise Hunton, RVT 02/08/2021, 4:38 PM

## 2021-02-08 NOTE — Progress Notes (Signed)
VASCULAR LAB    TCD with [facility] has been performed.  See CV proc for preliminary results.   Janes Colegrove, RVT 02/08/2021, 4:38 PM

## 2021-02-08 NOTE — Progress Notes (Signed)
VASCULAR LAB    Carotid duplex has been performed.  See CV proc for preliminary results.   Garth Diffley, RVT 02/08/2021, 4:38 PM

## 2021-02-09 ENCOUNTER — Inpatient Hospital Stay (HOSPITAL_COMMUNITY): Payer: Medicare HMO

## 2021-02-09 ENCOUNTER — Encounter (HOSPITAL_COMMUNITY): Payer: Self-pay | Admitting: Internal Medicine

## 2021-02-09 DIAGNOSIS — R1310 Dysphagia, unspecified: Secondary | ICD-10-CM

## 2021-02-09 DIAGNOSIS — T17900S Unspecified foreign body in respiratory tract, part unspecified causing asphyxiation, sequela: Secondary | ICD-10-CM

## 2021-02-09 DIAGNOSIS — Z930 Tracheostomy status: Secondary | ICD-10-CM | POA: Diagnosis not present

## 2021-02-09 DIAGNOSIS — E1100 Type 2 diabetes mellitus with hyperosmolarity without nonketotic hyperglycemic-hyperosmolar coma (NKHHC): Secondary | ICD-10-CM | POA: Diagnosis not present

## 2021-02-09 DIAGNOSIS — J9601 Acute respiratory failure with hypoxia: Secondary | ICD-10-CM | POA: Diagnosis not present

## 2021-02-09 LAB — RENAL FUNCTION PANEL
Albumin: 2.8 g/dL — ABNORMAL LOW (ref 3.5–5.0)
Anion gap: 14 (ref 5–15)
BUN: 46 mg/dL — ABNORMAL HIGH (ref 8–23)
CO2: 22 mmol/L (ref 22–32)
Calcium: 9.1 mg/dL (ref 8.9–10.3)
Chloride: 103 mmol/L (ref 98–111)
Creatinine, Ser: 5.47 mg/dL — ABNORMAL HIGH (ref 0.61–1.24)
GFR, Estimated: 11 mL/min — ABNORMAL LOW (ref >60)
Glucose, Bld: 165 mg/dL — ABNORMAL HIGH (ref 70–99)
Phosphorus: 3.8 mg/dL (ref 2.5–4.6)
Potassium: 3.5 mmol/L (ref 3.5–5.1)
Sodium: 139 mmol/L (ref 135–145)

## 2021-02-09 LAB — GLUCOSE, CAPILLARY
Glucose-Capillary: 110 mg/dL — ABNORMAL HIGH (ref 70–99)
Glucose-Capillary: 131 mg/dL — ABNORMAL HIGH (ref 70–99)
Glucose-Capillary: 147 mg/dL — ABNORMAL HIGH (ref 70–99)
Glucose-Capillary: 150 mg/dL — ABNORMAL HIGH (ref 70–99)
Glucose-Capillary: 158 mg/dL — ABNORMAL HIGH (ref 70–99)
Glucose-Capillary: 190 mg/dL — ABNORMAL HIGH (ref 70–99)

## 2021-02-09 LAB — SURGICAL PATHOLOGY

## 2021-02-09 LAB — MAGNESIUM: Magnesium: 1.9 mg/dL (ref 1.7–2.4)

## 2021-02-09 MED ORDER — KCL IN DEXTROSE-NACL 20-5-0.9 MEQ/L-%-% IV SOLN
INTRAVENOUS | Status: DC
  Filled 2021-02-09 (×3): qty 1000

## 2021-02-09 MED ORDER — APIXABAN 5 MG PO TABS
5.0000 mg | ORAL_TABLET | Freq: Two times a day (BID) | ORAL | Status: DC
  Filled 2021-02-09: qty 1

## 2021-02-09 MED ORDER — ENOXAPARIN SODIUM 100 MG/ML IJ SOSY: 100.0000 mg | PREFILLED_SYRINGE | INTRAMUSCULAR | Status: DC

## 2021-02-09 MED ORDER — FAMOTIDINE 40 MG/5ML PO SUSR
20.0000 mg | Freq: Two times a day (BID) | ORAL | Status: DC
  Filled 2021-02-09 (×2): qty 2.5

## 2021-02-09 MED ORDER — PANTOPRAZOLE SODIUM 40 MG IV SOLR
40.0000 mg | Freq: Two times a day (BID) | INTRAVENOUS | Status: DC
  Administered 2021-02-09 – 2021-02-10 (×3): 40 mg via INTRAVENOUS
  Filled 2021-02-09 (×3): qty 40

## 2021-02-09 MED ORDER — FENTANYL CITRATE PF 50 MCG/ML IJ SOSY
12.5000 ug | PREFILLED_SYRINGE | INTRAMUSCULAR | Status: DC | PRN
  Administered 2021-02-09: 12.5 ug via INTRAVENOUS
  Filled 2021-02-09: qty 1

## 2021-02-09 MED ORDER — PANTOPRAZOLE SODIUM 40 MG PO TBEC
40.0000 mg | DELAYED_RELEASE_TABLET | Freq: Two times a day (BID) | ORAL | Status: DC
  Administered 2021-02-09: 40 mg via ORAL
  Filled 2021-02-09: qty 1

## 2021-02-09 MED ORDER — FAMOTIDINE IN NACL 20-0.9 MG/50ML-% IV SOLN
20.0000 mg | Freq: Every day | INTRAVENOUS | Status: DC
  Administered 2021-02-10: 20 mg via INTRAVENOUS
  Filled 2021-02-09 (×2): qty 50

## 2021-02-09 MED ORDER — PANCRELIPASE (LIP-PROT-AMYL) 10440-39150 UNITS PO TABS
20880.0000 [IU] | ORAL_TABLET | Freq: Once | ORAL | Status: AC
  Administered 2021-02-09: 20880 [IU]
  Filled 2021-02-09: qty 2

## 2021-02-09 MED ORDER — SODIUM BICARBONATE 650 MG PO TABS
650.0000 mg | ORAL_TABLET | Freq: Once | ORAL | Status: AC
  Administered 2021-02-09: 650 mg
  Filled 2021-02-09: qty 1

## 2021-02-09 MED ORDER — FAMOTIDINE IN NACL 20-0.9 MG/50ML-% IV SOLN
20.0000 mg | Freq: Two times a day (BID) | INTRAVENOUS | Status: DC
  Administered 2021-02-09: 20 mg via INTRAVENOUS
  Filled 2021-02-09 (×2): qty 50

## 2021-02-09 MED ORDER — CEFAZOLIN SODIUM-DEXTROSE 2-4 GM/100ML-% IV SOLN
2.0000 g | Freq: Once | INTRAVENOUS | Status: AC
  Filled 2021-02-09: qty 100

## 2021-02-09 MED ORDER — SODIUM CHLORIDE 3 % IN NEBU
4.0000 mL | INHALATION_SOLUTION | Freq: Two times a day (BID) | RESPIRATORY_TRACT | Status: AC
  Administered 2021-02-09 – 2021-02-12 (×6): 4 mL via RESPIRATORY_TRACT
  Filled 2021-02-09 (×7): qty 4

## 2021-02-09 NOTE — Consult Note (Signed)
Chief Complaint: Patient was seen in consultation today for  Chief Complaint  Patient presents with   Abdominal Pain    Referring Physician(s): [name], NP   Supervising Physician: Ruthann Cancer  Patient Status: [facility] - In-pt  History of Present Illness: Thomas [name] is a 65 y.o. male with a medical history significant for DM, left MCA stroke, brain tumor, epilepsy and Stage IIIb colon cancer s/p colectomy with colostomy creation. currently undergoing chemotherapy.  He presented to the [facility] ED 11/30/20 with complaints of abdominal pain, anorexia and weakness. His lactic acid was 6.8 and he was admitted for sepsis. His hospital course has been complicated by Afib with RVR, delirium, pneumonia and multiple organ system failure. He was intubated and started on CRRT for AKI on 12/07/20 and a femoral non-tunneled catheter was placed for dialysis access. Patient was able to transition from CRRT to HD on 12/19/20 and he was seen in IR for a tunneled dialysis catheter on 12/21/20. He now has a tracheostomy and has persistent dysphagia.   Interventional Radiology has been asked to evaluate this patient for an image-guided gastrostomy tube placement to facilitate his long-term nutritional needs.   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   Dizziness    Dyspnea    pt states d/t weight   Epilepsy ([place])    takes Keppra daily   GERD (gastroesophageal reflux disease)    takes Omeprazole daily   Headache    Hyperlipidemia    takes Atorvastatin daily   Hypertension    takes Lotrel daily   Peripheral edema    takes Lasix daily   Peripheral neuropathy    takes Gabapentin daily    Pneumonia 3 yrs ago   hx of   Seizures ([place])    Sleep apnea    Urinary frequency     Past Surgical History:  Procedure Laterality Date   BIOPSY  09/05/2020   Procedure: BIOPSY;  Surgeon: Irene Shipper, MD;  Location: [facility] ENDOSCOPY;  Service: Endoscopy;;   BIOPSY  02/06/2021   Procedure: BIOPSY;  Surgeon: Jerene Bears, MD;  Location: [place] ENDOSCOPY;  Service: Gastroenterology;;   CARDIAC CATHETERIZATION  7 yrs ago   COLONOSCOPY WITH PROPOFOL N/A 09/05/2020   Procedure: COLONOSCOPY WITH PROPOFOL;  Surgeon: Irene Shipper, MD;  Location: [place];  Service: Endoscopy;  Laterality: N/A;   CRANIOTOMY N/A 09/01/2016   Procedure: CRANIOTOMY TUMOR  LEFT PTERIONAL;  Surgeon: Ashok Pall, MD;  Location: [place];  Service: Neurosurgery;  Laterality: N/A;   cyst removed from chest      as a child   ESOPHAGOGASTRODUODENOSCOPY (EGD) WITH PROPOFOL N/A 02/06/2021   Procedure: ESOPHAGOGASTRODUODENOSCOPY (EGD) WITH PROPOFOL;  Surgeon: Jerene Bears, MD;  Location: [facility] ENDOSCOPY;  Service: Gastroenterology;  Laterality: N/A;   IR FLUORO GUIDE CV LINE LEFT  12/22/2020   IR US GUIDE VASC ACCESS LEFT  12/22/2020   NO PAST SURGERIES     PARTIAL COLECTOMY N/A 09/10/2020   Procedure: TRANSVERSE COLECTOMY;  Surgeon: [name], MD;  Location: [place];  Service: General;  Laterality: N/A;  POLYPECTOMY  09/05/2020   Procedure: POLYPECTOMY;  Surgeon: Irene Shipper, MD;  Location: [place];  Service: Endoscopy;;   PORTACATH PLACEMENT Right 10/01/2020   Procedure: INSERTION PORT-A-CATH;  Surgeon: [name], MD;  Location: [place];  Service: General;  Laterality: Right;   SUBMUCOSAL TATTOO INJECTION  09/05/2020   Procedure: SUBMUCOSAL TATTOO INJECTION;  Surgeon: Irene Shipper, MD;  Location: [facility] ENDOSCOPY;  Service: Endoscopy;;   TRACHEOSTOMY TUBE PLACEMENT N/A 01/05/2021   Procedure: TRACHEOSTOMY;  Surgeon: [name], MD;  Location: [place];  Service: ENT;  Laterality: N/A;    Allergies: Morphine and  related  Medications: Prior to Admission medications   Medication Sig Start Date End Date Taking? Authorizing Provider  acetaminophen (TYLENOL) 500 MG tablet Take 1,000 mg by mouth 3 (three) times daily as needed for headache (pain).   Yes [provider]  albuterol (PROVENTIL HFA;VENTOLIN HFA) 108 (90 BASE) MCG/ACT inhaler Inhale 2 puffs into the lungs every 4 (four) hours as needed for wheezing or shortness of breath. 04/09/15  Yes Muthersbaugh, Jarrett Soho, PA-C  amLODipine (NORVASC) 10 MG tablet Take 10 mg by mouth daily. 05/11/20  Yes [provider]  capecitabine (XELODA) 500 MG tablet Take 3 tablets (1,500 mg total) by mouth 2 (two) times daily after a meal. 11/19/20  Yes Ladell Pier, MD  gabapentin (NEURONTIN) 300 MG capsule Take 1 capsule (300 mg total) by mouth 3 (three) times daily. 04/06/16  Yes Varney Biles, MD  levETIRAcetam (KEPPRA) 1000 MG tablet Take 1,000 mg by mouth 2 (two) times daily. 07/28/16  Yes [provider]  metFORMIN (GLUCOPHAGE-XR) 500 MG 24 hr tablet Take 500 mg by mouth 2 (two) times daily. 07/27/20  Yes [provider]  omeprazole (PRILOSEC) 20 MG capsule Take 20 mg by mouth daily. 08/10/16  Yes [provider]  atorvastatin (LIPITOR) 20 MG tablet Take 20 mg by mouth daily at 6 PM.    [provider]  lidocaine-prilocaine (EMLA) cream Apply 1 application topically as directed. Apply 1/2 tablespoon to port site 2 hours prior to stick and cover with plastic wrap to numb site Patient not taking: Reported on 10/08/2020 09/24/20   Ladell Pier, MD  ondansetron (ZOFRAN) 8 MG tablet Take 1 tablet (8 mg total) by mouth every 8 (eight) hours as needed for nausea or vomiting. Patient not taking: No sig reported 09/24/20   Ladell Pier, MD  prochlorperazine (COMPAZINE) 10 MG tablet Take 1 tablet (10 mg total) by mouth every 6 (six) hours as needed. Patient not taking: No sig reported 09/24/20   Ladell Pier, MD      Family History  Problem Relation Age of Onset   Breast cancer Mother    Breast cancer Maternal Aunt    Prostate cancer Maternal Uncle     Social History   Socioeconomic History   Marital status: Married    Spouse name: Mahalia   Number of children: 4   Years of education: Not on file   Highest education level: Not on file  Occupational History   Occupation: Disabled  Tobacco Use   Smoking status: Former   Smokeless tobacco: Never   Tobacco comments:    quit smoking 35 yrs ago  Vaping Use   Vaping Use: Never used  Substance and Sexual Activity   Alcohol use: No   Drug use: No   Sexual activity: Not on file  Other Topics Concern   Not on file  Social History Narrative  Married to his wife, Jerlyn Ly with total of #4 children (#2 with current wife). Lives in home with his son and daughter-in-law.     Social Determinants of Health   Financial Resource Strain: Not on file  Food Insecurity: Not on file  Transportation Needs: Not on file  Physical Activity: Not on file  Stress: Not on file  Social Connections: Not on file    Review of Systems: A 12 point ROS discussed and pertinent positives are indicated in the HPI above.  All other systems are negative.  Review of Systems  Unable to perform ROS: Other   Vital Signs: BP 133/70   Pulse 93   Temp 97.9 F (36.6 C) Comment: axillary  Resp 18   Ht 5\' 8"  (1.727 m)   Wt 215 lb 9.8 oz (97.8 kg)   SpO2 100%   BMI 32.78 kg/m   Physical Exam Constitutional:      General: He is not in acute distress.    Comments: Awake/alert. Able to speak with passy muir in place.   HENT:     Mouth/Throat:     Mouth: Mucous membranes are dry.  Cardiovascular:     Comments: Left IJ Tunneled dialysis catheter. RIJ Port-a-cath; currently accessed.  Pulmonary:     Breath sounds: Rhonchi present.     Comments: Tracheostomy - passy muir valve Abdominal:     General: Bowel sounds are normal.     Palpations: Abdomen is soft.      Tenderness: There is no abdominal tenderness.     Comments: colostomy  Musculoskeletal:     Right lower leg: No edema.     Left lower leg: No edema.  Skin:    General: Skin is warm and dry.  Neurological:     Mental Status: He is alert and oriented to person, place, and time.    Imaging: CT ABDOMEN PELVIS WO CONTRAST  Result Date: 02/05/2021 CLINICAL DATA:  Left lower quadrant abdominal pain EXAM: CT ABDOMEN AND PELVIS WITHOUT CONTRAST TECHNIQUE: Multidetector CT imaging of the abdomen and pelvis was performed following the standard protocol without IV contrast. COMPARISON:  CT abdomen/pelvis dated 01/30/2021, 01/26/2021, and 12/27/2020. FINDINGS: Lower chest: Progressive patchy right lower lobe opacity, likely atelectasis, less likely pneumonia. Hepatobiliary: Liver is within normal limits. Gallbladder is unremarkable. No intrahepatic or extrahepatic ductal dilatation. Pancreas: Within normal limits. Spleen: Within normal limits. Adrenals/Urinary Tract: Adrenal glands are within normal limits. 4.0 cm lateral interpolar left renal cyst (series 3/image 38). Right kidney is within normal limits. No renal calculi or hydronephrosis. Bladder is decompressed by an indwelling Foley catheter. Stomach/Bowel: Stomach is within normal limits. Enteric tube terminates in the duodenal bulb. No evidence of bowel obstruction. Status post transverse colectomy with right mid abdominal ileostomy. Vascular/Lymphatic: No evidence of abdominal aortic aneurysm. Atherosclerotic calcifications of the abdominal aorta and branch vessels. No suspicious abdominopelvic lymphadenopathy. Reproductive: Prostate is unremarkable. Other: No abdominopelvic ascites. Small fat containing right inguinal hernia. Moderate fat containing left inguinal hernia (series 3/image 90). Musculoskeletal: Mild degenerative changes at L3-4. IMPRESSION: Progressive patchy right lower lobe opacity, likely atelectasis, less likely pneumonia. Additional  stable ancillary and postsurgical findings from multiple recent prior CTs. Electronically Signed   By: Julian Hy M.D.   On: 02/05/2021 02:31   CT ABDOMEN PELVIS WO CONTRAST  Result Date: 01/30/2021 CLINICAL DATA:  Abdominal pain.  Bowel obstruction suspected. EXAM: CT ABDOMEN AND PELVIS WITHOUT CONTRAST TECHNIQUE: Multidetector CT imaging of the abdomen and pelvis was performed following the  standard protocol without IV contrast. COMPARISON:  Prior radiographs.  Most recent CT 01/26/2021 FINDINGS: Lower chest: Subsegmental atelectasis in both lower lobes. No pleural fluid. Hepatobiliary: Focal liver abnormality on this unenhanced exam. Gallbladder physiologically distended, no calcified stone. No biliary dilatation. Pancreas: No ductal dilatation or inflammation. Spleen: Normal in size without focal abnormality. Splenules inferiorly. Adrenals/Urinary Tract: Normal adrenal glands. No hydronephrosis or renal calculi. Mild bilateral perinephric edema. Again seen cyst in the mid left kidney. Decompressed urinary bladder by Foley catheter. Stomach/Bowel: Transverse colectomy with right upper quadrant ostomy. The descending and sigmoid colon remain in on-site 2 and are unchanged in appearance, with intraluminal fluid. No associated inflammation. This is similar in appearance to prior exam. The stomach is decompressed. Feeding tube tip in the proximal duodenum. No obstruction or small bowel inflammation. Vascular/Lymphatic: Aorto bi-iliac atherosclerosis. No aortic aneurysm. Retroaortic left renal vein. Portal venous or mesenteric gas. No bulky abdominopelvic adenopathy. Reproductive: Prostate is unremarkable. Other: No ascites. No free air. No evidence of focal fluid collection or abscess. Fat containing inguinal hernias. Postsurgical change of the anterior abdominal wall without abdominal wall collection. Musculoskeletal: Stable osseous structures. IMPRESSION: 1. No bowel obstruction or acute abnormality in  the abdomen/pelvis. No abscess. 2. Stable postsurgical change of the transverse colon. 3. Improving basilar opacities from prior abdominal CT, favoring atelectasis. Aortic Atherosclerosis (ICD10-I70.0). Electronically Signed   By: Keith Rake M.D.   On: 01/30/2021 22:33   CT ABDOMEN PELVIS WO CONTRAST  Result Date: 01/26/2021 CLINICAL DATA:  Abdominal abscess or infection suspected. EXAM: CT ABDOMEN AND PELVIS WITHOUT CONTRAST TECHNIQUE: Multidetector CT imaging of the abdomen and pelvis was performed following the standard protocol without IV contrast. COMPARISON:  12/27/2020 FINDINGS: Lower chest: Atelectasis or infiltration demonstrated in the lung bases. Possible pneumonia. Hepatobiliary: No focal liver abnormality is seen. No gallstones, gallbladder wall thickening, or biliary dilatation. Pancreas: Unremarkable. No pancreatic ductal dilatation or surrounding inflammatory changes. Spleen: Normal spleen size.  No focal lesions identified. Adrenals/Urinary Tract: Cyst on the left kidney is unchanged. No hydronephrosis or hydroureter. No adrenal gland nodules. Bladder is decompressed with a Foley catheter in place. Stomach/Bowel: Enteric tube with tip in the distal stomach. Partial right hemicolectomy with diverting ileostomy in the right lower quadrant. Contrast material flows through to the stoma without evidence of obstruction. The distal colon is decompressed. No wall thickening or inflammatory changes. No mesenteric collections. Vascular/Lymphatic: Aortic atherosclerosis. No enlarged abdominal or pelvic lymph nodes. Reproductive: Prostate is unremarkable. Other: Small left inguinal hernia containing fat. No free air or free fluid in the abdomen. Musculoskeletal: No acute or significant osseous findings. IMPRESSION: 1. No intra-abdominal abscess identified. 2. No evidence of bowel obstruction or inflammation. 3. Infiltration or atelectasis in the lung bases could indicate pneumonia. Electronically  Signed   By: Lucienne Capers M.D.   On: 01/26/2021 19:03   DG Abd 1 View  Result Date: 02/03/2021 CLINICAL DATA:  Small-bowel obstruction EXAM: ABDOMEN - 1 VIEW COMPARISON:  None. FINDINGS: Nasoenteric feeding tube tip is seen within the gastric antrum. Normal abdominal gas pattern. No free intraperitoneal gas. IMPRESSION: Nasoenteric feeding tube tip within the gastric antrum. Electronically Signed   By: Fidela Salisbury M.D.   On: 02/03/2021 01:43   DG Abd 1 View  Result Date: 01/22/2021 CLINICAL DATA:  Vomiting, possible aspiration EXAM: ABDOMEN - 1 VIEW COMPARISON:  12/23/2020 FINDINGS: 2 frontal views of the abdomen and pelvis demonstrates a weighted tip of an enteric feeding catheter overlying the gastric antrum. There is  a paucity of bowel gas. No masses or abnormal calcifications. IMPRESSION: 1. Enteric catheter overlying gastric antrum. 2. Paucity of bowel gas. Electronically Signed   By: Randa Ngo M.D.   On: 01/22/2021 20:27   MR BRAIN WO CONTRAST  Result Date: 02/05/2021 CLINICAL DATA:  Neuro deficit, acute, stroke suspected EXAM: MRI HEAD WITHOUT CONTRAST TECHNIQUE: Multiplanar, multiecho pulse sequences of the brain and surrounding structures were obtained without intravenous contrast. COMPARISON:  CT 12/05/2020.  MRI 04/10/2014. FINDINGS: Brain: Multiple punctate foci of restricted diffusion in bilateral frontal white matter (series 5, image 89). Mild associated edema without mass effect. Chronic encephalomalacia in the left frontal lobe secondary to resection of an extra-axial mass (see MRI from 04/10/2014). Surrounding T2 hyperintensity, nonspecific but compatible with gliosis. No mass effect. The absence of contrast precludes evaluation for enhancement. 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 No hydrocephalus, midline shift, acute hemorrhage, or extra-axial fluid collection. Generalized atrophy. Vascular: Major arterial flow voids are maintained at the skull base. Skull and upper  cervical spine: Normal marrow signal. Sinuses/Orbits: Mild paranasal sinus mucosal thickening. No acute orbital findings. Other: Moderate bilateral mastoid effusions. IMPRESSION: 1. Multiple punctate foci of restricted diffusion in bilateral frontal white matter, most likely acute infarcts. Given the patient's known history of colon cancer, consider postcontrast imaging to ensure these areas do not enhance and exclude metastatic disease. 2. Postsurgical left frontal encephalomalacia and craniotomy, described above. 3. Moderate bilateral mastoid effusions. Electronically Signed   By: Margaretha Sheffield M.D.   On: 02/05/2021 20:00   MR BRAIN W CONTRAST  Result Date: 02/06/2021 CLINICAL DATA:  Stroke follow-up. EXAM: MRI HEAD WITH CONTRAST TECHNIQUE: Multiplanar, multiecho pulse sequences of the brain and surrounding structures were obtained with intravenous contrast. CONTRAST:  51mL GADAVIST GADOBUTROL 1 MMOL/ML IV SOLN COMPARISON:  MRI of the brain February 05, 2021 FINDINGS: Postcontrast images obtained and compared to recent MRI of the brain pre contrast images. Two of the foci of restricted diffusion previously described on prior MRI showed faint contrast enhancement, in the deep white matter of each frontal lobe (series 5, images 33 and 35). No other focus of abnormal contrast enhancement. IMPRESSION: Enhancement of 2 of the foci of restricted diffusion described on prior MRI. Differential diagnosis remains the same including infarct versus small metastatic disease given the acute/subacute infarct may show contrast enhancement. A 6-8 week follow-up MRI with and without contrast is recommended. Electronically Signed   By: Pedro Earls M.D.   On: 02/06/2021 13:21   DG CHEST PORT 1 VIEW  Result Date: 01/29/2021 CLINICAL DATA:  Acute abdominal pain EXAM: PORTABLE CHEST 1 VIEW COMPARISON:  Chest x-ray dated January 22, 2021 FINDINGS: Right-sided port, left dialysis catheter, ET tube, and  enteric tube are unchanged in position. Mild bibasilar atelectasis. Lungs are otherwise clear. No large pleural effusion or evidence of pneumothorax. IMPRESSION: Bibasilar atelectasis.  Lungs otherwise clear. Stable support devices. Electronically Signed   By: Yetta Glassman M.D.   On: 01/29/2021 14:09   DG CHEST PORT 1 VIEW  Result Date: 01/22/2021 CLINICAL DATA:  Vomiting, possible aspiration EXAM: PORTABLE CHEST 1 VIEW COMPARISON:  01/18/2021 FINDINGS: Single frontal view of the chest demonstrates right chest wall port and left internal jugular dialysis catheter unchanged. Tracheostomy tube and enteric catheter are stable. Cardiac silhouette is unremarkable. No airspace disease, effusion, or pneumothorax. No acute bony abnormalities. IMPRESSION: 1. Stable support devices.  No acute process. Electronically Signed   By: Randa Ngo M.D.   On:  01/22/2021 20:26   DG CHEST PORT 1 VIEW  Result Date: 01/18/2021 CLINICAL DATA:  Shortness of breath.  Follow-up tubes and catheters. EXAM: PORTABLE CHEST 1 VIEW COMPARISON:  01/08/2021 FINDINGS: Borderline enlarged cardiac silhouette. Tracheostomy tube in satisfactory position. Feeding tube extending into the distal stomach. Left jugular catheter tip in the inferior aspect of the superior vena cava. Right jugular porta catheter tip at the superior cavoatrial junction. Interval mild right basilar atelectasis and possible small pleural effusion. Clear left lung. Unremarkable bones. IMPRESSION: Interval mild right basilar atelectasis and possible small right pleural effusion. Electronically Signed   By: Claudie Revering M.D.   On: 01/18/2021 09:52   DG Abd 2 Views  Result Date: 02/04/2021 CLINICAL DATA:  Left lower quadrant abdominal pain. EXAM: ABDOMEN - 2 VIEW COMPARISON:  CT scan 01/30/2021 and abdominal radiograph from yesterday. FINDINGS: Stable mild eventration of the right hemidiaphragm with some overlying atelectasis. The feeding tube tip is in the  antropyloric region of the stomach. Stable air-filled small bowel loops in the right abdomen but no significant distension. No free air. Right lower quadrant ostomy noted. IMPRESSION: Feeding tube tip is in the antropyloric region of the stomach. No significant bowel distention or free air. Electronically Signed   By: Marijo Sanes M.D.   On: 02/04/2021 12:59   DG Abd 2 Views  Result Date: 01/30/2021 CLINICAL DATA:  Abdominal pain, small-bowel obstruction EXAM: ABDOMEN - 2 VIEW COMPARISON:  01/29/2021 abdominal radiograph FINDINGS: Right lateral abdominal colostomy bag noted. Mildly dilated small bowel loops in the right abdomen up to 3.7 cm diameter with air-fluid level on the decubitus view, not appreciably changed. No evidence of pneumatosis or pneumoperitoneum. No radiopaque nephrolithiasis. Mild lumbar spondylosis. Enteric tube tip in the distal stomach/pyloric region. IMPRESSION: No appreciable change in mildly dilated small bowel loops in the right abdomen, compatible with partial distal small bowel obstruction. Enteric tube tip in the distal stomach/pyloric region. Electronically Signed   By: Ilona Sorrel M.D.   On: 01/30/2021 11:08   DG Abd 2 Views  Result Date: 01/29/2021 CLINICAL DATA:  Acute abdominal pain, history of right abdominal ostomy EXAM: ABDOMEN - 2 VIEW COMPARISON:  01/22/2021 abdominal radiograph FINDINGS: Enteric tube courses through the stomach with tip in the right upper quadrant likely in the region of the pylorus. A few mildly dilated bowel loops are present in the right abdomen up to the 3.9 cm diameter with associated air-fluid levels on the decubitus view. Gasless left abdomen. Colostomy noted in the lateral right abdomen. No evidence of pneumatosis or pneumoperitoneum. IMPRESSION: 1. Enteric tube courses through the stomach with tip in the right upper quadrant likely in the region of the pylorus. 2. No evidence of pneumoperitoneum. 3. Mildly dilated bowel loops in the right  abdomen with associated air-fluid levels, differential includes mild partial distal small bowel obstruction versus adynamic ileus. Electronically Signed   By: Ilona Sorrel M.D.   On: 01/29/2021 13:27   DG Abd Portable 1V  Result Date: 02/06/2021 CLINICAL DATA:  Feeding tube placement EXAM: PORTABLE ABDOMEN - 1 VIEW COMPARISON:  Portable exam 1041 hours compared to 02/05/2021 FINDINGS: Tip of feeding tube projects over duodenal bulb/proximal descending duodenum. Paucity of bowel gas. Lung bases clear. Osseous structures unremarkable. IMPRESSION: Tip of feeding tube projects over duodenal bulb/proximal descending duodenum Electronically Signed   By: Lavonia Dana M.D.   On: 02/06/2021 13:36   DG Abd Portable 1V  Result Date: 02/05/2021 CLINICAL DATA:  Ileus EXAM: PORTABLE ABDOMEN -  1 VIEW COMPARISON:  Abdominal radiographs 02/04/2021, CT abdomen/pelvis 02/05/2021 FINDINGS: Enteric catheter is in place with the tip projecting over the expected location of the pylorus/first portion of the duodenum. There is a relative paucity of bowel gas throughout the abdomen with a single gas distended loop of bowel in the right hemiabdomen measuring up to 3.2 cm, nonspecific. There is no evidence of mechanical obstruction. The bones are unremarkable. IMPRESSION: 1. Relative paucity of bowel gas throughout the abdomen without evidence of mechanical obstruction. 2. Enteric catheter tip at the expected location of the pylorus/first portion of the duodenum. Electronically Signed   By: Valetta Mole M.D.   On: 02/05/2021 10:02   DG Swallowing Func-Speech Pathology  Result Date: 02/09/2021 Table formatting from the original result was not included. 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  Dizziness   Dyspnea   pt states d/t weight  Epilepsy ([place])   takes Keppra daily  GERD (gastroesophageal reflux disease)   takes Omeprazole daily  Headache   Hyperlipidemia   takes Atorvastatin daily  Hypertension   takes Lotrel daily  Peripheral edema   takes Lasix daily  Peripheral neuropathy   takes Gabapentin daily  Pneumonia 3 yrs ago  hx of  Seizures ([place])   Sleep apnea   Urinary frequency  Past Surgical History: Past Surgical History: Procedure Laterality Date  BIOPSY  09/05/2020  Procedure: BIOPSY;  Surgeon: Irene Shipper, MD;  Location: [facility] ENDOSCOPY;  Service: Endoscopy;;  BIOPSY  02/06/2021  Procedure: BIOPSY;  Surgeon: Jerene Bears, MD;  Location: [place] ENDOSCOPY;  Service: Gastroenterology;;  CARDIAC CATHETERIZATION  7 yrs ago  COLONOSCOPY WITH PROPOFOL N/A 09/05/2020  Procedure: COLONOSCOPY WITH PROPOFOL;  Surgeon: Irene Shipper, MD;  Location: [place];  Service: Endoscopy;  Laterality: N/A;  CRANIOTOMY N/A 09/01/2016  Procedure: CRANIOTOMY TUMOR  LEFT PTERIONAL;  Surgeon: Ashok Pall, MD;  Location: [place];  Service: Neurosurgery;  Laterality: N/A;  cyst removed from chest     as a child  ESOPHAGOGASTRODUODENOSCOPY (EGD) WITH PROPOFOL N/A 02/06/2021  Procedure: ESOPHAGOGASTRODUODENOSCOPY (EGD) WITH PROPOFOL;  Surgeon: Jerene Bears, MD;  Location: [facility] ENDOSCOPY;  Service: Gastroenterology;  Laterality: N/A;  IR FLUORO GUIDE CV LINE LEFT  12/22/2020  IR US GUIDE VASC ACCESS LEFT  12/22/2020  NO PAST SURGERIES    PARTIAL COLECTOMY N/A 09/10/2020  Procedure: TRANSVERSE COLECTOMY;  Surgeon: [name], MD;  Location: [place];  Service:  General;  Laterality: N/A;  POLYPECTOMY  09/05/2020  Procedure: POLYPECTOMY;  Surgeon: Irene Shipper, MD;  Location: [facility] ENDOSCOPY;  Service: Endoscopy;;  PORTACATH PLACEMENT Right 10/01/2020  Procedure: INSERTION PORT-A-CATH;  Surgeon: [name], MD;  Location: [place];  Service: General;  Laterality: Right;  SUBMUCOSAL TATTOO INJECTION  09/05/2020  Procedure: SUBMUCOSAL TATTOO INJECTION;  Surgeon: Irene Shipper, MD;  Location: [place];  Service: Endoscopy;;  TRACHEOSTOMY TUBE PLACEMENT  N/A 01/05/2021  Procedure: TRACHEOSTOMY;  Surgeon: [name], MD;  Location: [place];  Service: ENT;  Laterality: N/A; HPI: Pt is a 65 y/o male admitted on 7/25 for worsening sepsis vs reaction to chemotherapy with high ostomy output.  Pt developed worsening hypoxia and was requiring BIPAP w/ FIO2 of 100% and increased work of breathing. SLP consulted due to concern for aspiration. BSE 7/28 recommended NPO status and MBS, but this could not completed subsequently due to lethargy. CXR in AM of 7/30 showed some worsening concern of aspiration. ETT 7/30-8/8; tolerated nasal cannula for about an hour; reintubated 8/8-trach 8/11. Pt pulled out trach x2 8/22; replaced by PCCM. Bronch on 8/22 revealed endotracheal granulation tissue at trach site. Trach removed again on 8/25; unable to reinsert despite multiple attenpts; pt reintubated 8/25- trach by ENT on 8/29. BSE 8/22 limited to ice chips due to pt's refusal of other consistencies; rx NPO status with Cortrak.EGD 9/30:grade C reflux esophagitis/non-bleeding duodenal ulcers.  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  Subjective: alert, cortrak removed Assessment / Plan / Recommendation CHL IP CLINICAL IMPRESSIONS 02/09/2021  Clinical Impression   Pt continues to present with a severe oral and pharyngeal dysphagia with improved visibility of the pharyngeal phase during today's study.  There was limited mobility of the tongue, with some elevation of the superior surface, but poor tongue tip elevation and base of tongue retraction. The chair was moved to a reclined position in an effort to allow gravity to assist with bolus transfer.  Five and ten ml boluses of nectar and thin liquid were administered from a syringe into mid-oral cavity.  Pt demonstrated effort to manipulate material; however, liquids generally transitioned passively into pharynx.  Repetitive attempts to swallow were observed - there was severe impairment in the mobility of the larynx, limited pharyngeal squeeze, and incomplete epiglottic inversion over the larynx.  Liquids filled the valleculae and pyriforms and spilled into the airway before, during, and after efforts to swallow.  Aspiration did not generally elicit a cough response.  Study was discontinued.  MBS was observed in real time by pt's wife and daughter.  We discussed results.  Results were also reviewed with [name].  Etiology of such severe dysphagia is unclear.  Neurological findings would not generally lead to such severe impairment. Pt's voice is improving (PMV in use during study), and his deficits are not consistent with the type of laryngeal trauma associated with oral intubation. There is potential for the inflammation seen on EGD and degree of thrush on tongue to also impact pharynx/larynx (not visible via MBS and not noted per EGD) and interfere with function. For now, recommend continuing NPO except ice chips. Mr. and Mrs. Matousek have been amenable to discussing PEG.  SLP will follow for therapeutic exercises if he is able and will explore potential for free water protocol. SLP Visit Diagnosis Dysphagia, oropharyngeal phase (R13.12) Attention and concentration deficit following -- Frontal lobe  and executive function deficit following -- Impact on safety and function Severe aspiration risk;Risk for inadequate nutrition/hydration   CHL IP TREATMENT RECOMMENDATION 02/09/2021 Treatment Recommendations Therapy as outlined in treatment plan below   Prognosis 02/09/2021 Prognosis for Safe Diet Advancement Fair Barriers to Reach Goals Time post onset;Severity of deficits Barriers/Prognosis Comment -- CHL IP DIET  RECOMMENDATION 02/09/2021 SLP Diet Recommendations Other (Comment) Liquid Administration via -- Medication Administration Via alternative means Compensations -- Postural Changes --   CHL IP OTHER RECOMMENDATIONS 02/09/2021 Recommended Consults -- Oral Care Recommendations Oral care QID Other Recommendations --   CHL IP FOLLOW UP RECOMMENDATIONS 02/09/2021 Follow up Recommendations Skilled Nursing facility   [facility] IP FREQUENCY AND DURATION 02/09/2021 Speech Therapy Frequency (ACUTE ONLY) min 2x/week Treatment Duration 2 weeks      CHL IP ORAL PHASE 02/09/2021 Oral Phase Impaired Oral - Pudding Teaspoon -- Oral - Pudding Cup -- Oral - Honey Teaspoon -- Oral - Honey Cup -- Oral - Nectar Teaspoon Weak lingual manipulation;Incomplete tongue to palate contact;Reduced posterior propulsion;Right pocketing in lateral sulci;Left pocketing in lateral sulci;Pocketing in anterior sulcus;Lingual/palatal residue;Delayed oral transit;Decreased bolus cohesion Oral - Nectar Cup -- Oral - Nectar Straw -- Oral - Thin Teaspoon Weak lingual manipulation;Incomplete tongue to palate contact;Reduced posterior propulsion;Right pocketing in lateral sulci;Left pocketing in lateral sulci;Pocketing in anterior sulcus;Lingual/palatal residue;Delayed oral transit;Decreased bolus cohesion Oral - Thin Cup -- Oral - Thin Straw -- Oral - Puree NT Oral - Mech Soft -- Oral - Regular NT Oral - Multi-Consistency -- Oral - Pill -- Oral Phase - Comment --  CHL IP PHARYNGEAL PHASE 02/09/2021 Pharyngeal Phase Impaired Pharyngeal- Pudding Teaspoon --  Pharyngeal -- Pharyngeal- Pudding Cup -- Pharyngeal -- Pharyngeal- Honey Teaspoon -- Pharyngeal -- Pharyngeal- Honey Cup -- Pharyngeal -- Pharyngeal- Nectar Teaspoon Delayed swallow initiation-pyriform sinuses;Reduced pharyngeal peristalsis;Reduced epiglottic inversion;Reduced anterior laryngeal mobility;Reduced laryngeal elevation;Reduced airway/laryngeal closure;Reduced tongue base retraction;Penetration/Aspiration before swallow;Penetration/Aspiration during swallow;Penetration/Apiration after swallow;Moderate aspiration;Pharyngeal residue - pyriform;Pharyngeal residue - valleculae Pharyngeal -- Pharyngeal- Nectar Cup -- Pharyngeal -- Pharyngeal- Nectar Straw -- Pharyngeal -- Pharyngeal- Thin Teaspoon Delayed swallow initiation-pyriform sinuses;Reduced pharyngeal peristalsis;Reduced epiglottic inversion;Reduced anterior laryngeal mobility;Reduced laryngeal elevation;Reduced airway/laryngeal closure;Reduced tongue base retraction;Penetration/Aspiration before swallow;Penetration/Aspiration during swallow;Penetration/Apiration after swallow;Moderate aspiration;Pharyngeal residue - pyriform;Pharyngeal residue - valleculae Pharyngeal Material enters airway, passes BELOW cords without attempt by patient to eject out (silent aspiration);Material enters airway, passes BELOW cords and not ejected out despite cough attempt by patient Pharyngeal- Thin Cup NT Pharyngeal -- Pharyngeal- Thin Straw -- Pharyngeal -- Pharyngeal- Puree NT Pharyngeal -- Pharyngeal- Mechanical Soft -- Pharyngeal -- Pharyngeal- Regular NT Pharyngeal -- Pharyngeal- Multi-consistency -- Pharyngeal -- Pharyngeal- Pill -- Pharyngeal -- Pharyngeal Comment --  CHL IP CERVICAL ESOPHAGEAL PHASE 01/19/2021 Cervical Esophageal Phase (No Data) Pudding Teaspoon -- Pudding Cup -- Honey Teaspoon -- Honey Cup -- Nectar Teaspoon -- Nectar Cup -- Nectar Straw -- Thin Teaspoon -- Thin Cup -- Thin Straw -- Puree -- Mechanical Soft -- Regular -- Multi-consistency --  Pill -- Cervical Esophageal Comment -- Thomas [name] 02/09/2021, 3:35 PM              DG Swallowing Func-Speech Pathology  Result Date: 01/19/2021 Table formatting from the original result was not included. Objective Swallowing Evaluation: Type of Study: MBS-Modified Barium Swallow Study  Patient Details Name: Thomas [name] MRN: [id number] Date of Birth: 12-Jan-1956 Today's Date: 01/19/2021 Time: SLP Start Time (ACUTE ONLY): 1343 -SLP Stop Time (ACUTE ONLY): 1404 SLP Time Calculation (min) (ACUTE ONLY): 21 min Past Medical History: Past Medical History: Diagnosis Date  Acute ischemic left MCA stroke ([place]) 04/09/2014  Acute respiratory failure with hypoxia ([place]) 04/09/2014  Aspiration pneumonia ([place])   Asthma   as a child  Brain tumor ([place])   frontal  Cancer ([place])   Confusion   occasionally  Diabetes mellitus without complication ([place])   takes Metformin  daily.  Dizziness   Dyspnea   pt states d/t weight  Epilepsy ([place])   takes Keppra daily  GERD (gastroesophageal reflux disease)   takes Omeprazole daily  Headache   Hyperlipidemia   takes Atorvastatin daily  Hypertension   takes Lotrel daily  Peripheral edema   takes Lasix daily  Peripheral neuropathy   takes Gabapentin daily  Pneumonia 3 yrs ago  hx of  Seizures ([place])   Sleep apnea   Urinary frequency  Past Surgical History: Past Surgical History: Procedure Laterality Date  BIOPSY  09/05/2020  Procedure: BIOPSY;  Surgeon: Irene Shipper, MD;  Location: [facility] ENDOSCOPY;  Service: Endoscopy;;  CARDIAC CATHETERIZATION  7 yrs ago  COLONOSCOPY WITH PROPOFOL N/A 09/05/2020  Procedure: COLONOSCOPY WITH PROPOFOL;  Surgeon: Irene Shipper, MD;  Location: [facility] ENDOSCOPY;  Service: Endoscopy;  Laterality: N/A;  CRANIOTOMY N/A 09/01/2016  Procedure: CRANIOTOMY TUMOR  LEFT PTERIONAL;  Surgeon: Ashok Pall, MD;  Location: [place];  Service: Neurosurgery;  Laterality: N/A;  cyst removed from chest     as a child  IR FLUORO GUIDE CV LINE LEFT  12/22/2020  IR US GUIDE VASC ACCESS LEFT   12/22/2020  NO PAST SURGERIES    PARTIAL COLECTOMY N/A 09/10/2020  Procedure: TRANSVERSE COLECTOMY;  Surgeon: [name], MD;  Location: [place];  Service: General;  Laterality: N/A;  POLYPECTOMY  09/05/2020  Procedure: POLYPECTOMY;  Surgeon: Irene Shipper, MD;  Location: [place];  Service: Endoscopy;;  PORTACATH PLACEMENT Right 10/01/2020  Procedure: INSERTION PORT-A-CATH;  Surgeon: [name], MD;  Location: [place];  Service: General;  Laterality: Right;  SUBMUCOSAL TATTOO INJECTION  09/05/2020  Procedure: SUBMUCOSAL TATTOO INJECTION;  Surgeon: Irene Shipper, MD;  Location: [place];  Service: Endoscopy;;  TRACHEOSTOMY TUBE PLACEMENT N/A 01/05/2021  Procedure: TRACHEOSTOMY;  Surgeon: [name], MD;  Location: [place];  Service: ENT;  Laterality: N/A; HPI: Pt is a 65 y/o male admitted on 7/25 for worsening sepsis vs reaction to chemotherapy with high ostomy output and soft BP. Pt developed worsening hypoxia and was requiring BIPAP w/ FIO2 of 100% and increased work of breathing. SLP consulted due to concern for aspiration. BSE 7/28 recommended NPO status and MBS, but this could not completed subsequently due to lethargy. CXR in AM of 7/30 showed some worsening concern of aspiration. ETT 7/30-8/8; tolerated nasal cannula for about an hour; reintubated 8/8-trach 8/11. Pt pulled out trach x2 8/22; replaced by PCCM. Bronch on 8/22 revealed endotracheal granulation tissue at trach site. Trach removed again on 8/25; unable to reinsert despite multiple attenpts; pt reintubated 8/25- trach by ENT on 8/29. BSE 8/22 limited to ice chips due to pt's refusal of other consistencies; rx NPO status with Cortrak. 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   Subjective: upright in bed, requires cueing Assessment / Plan / Recommendation CHL IP CLINICAL IMPRESSIONS 01/19/2021 Clinical Impression Pt presented with oral phase dysphagia characterized by oral holding, reduced bolus cohesion, impaired bolus propulsion, and impaired mastication. Pt reported attempts at posterior bolus propulsion, but limited lingual movement was noted. A posterior head tilt was ineffective in facilitating adequate A-P transport for swallowing. Limited hyolaryngeal elevation was noted with tactile stimulation to swallow, but no boluses were propelled to the pharynx despite cues. No functional  benefit was noted with cued effortful swallows. It is recommended that the pt's NPO status be maintained and SLP will follow for dysphagia treatment to improve oropharyngeal function. SLP Visit Diagnosis Dysphagia, oropharyngeal phase (R13.12) Attention and concentration deficit following -- Frontal lobe and executive function deficit following -- Impact on safety and function Severe aspiration risk;Risk for inadequate nutrition/hydration   CHL IP TREATMENT RECOMMENDATION 01/19/2021 Treatment Recommendations Therapy as outlined in treatment plan below   Prognosis 01/19/2021 Prognosis for Safe Diet Advancement Fair Barriers to Reach Goals Time post onset;Severity of deficits Barriers/Prognosis Comment -- CHL IP DIET RECOMMENDATION 01/19/2021 SLP Diet Recommendations Alternative means - temporary;NPO Liquid Administration via -- Medication Administration Via alternative means Compensations -- Postural Changes --   CHL IP OTHER RECOMMENDATIONS 01/19/2021 Recommended Consults -- Oral Care Recommendations Oral care QID Other Recommendations --   CHL IP FOLLOW UP RECOMMENDATIONS 01/19/2021 Follow up Recommendations Skilled Nursing facility   [facility] IP FREQUENCY AND DURATION 01/19/2021 Speech Therapy Frequency (ACUTE ONLY) min 2x/week Treatment Duration 2 weeks      CHL IP ORAL PHASE 01/19/2021 Oral Phase Impaired Oral -  Pudding Teaspoon -- Oral - Pudding Cup -- Oral - Honey Teaspoon -- Oral - Honey Cup -- Oral - Nectar Teaspoon -- Oral - Nectar Cup -- Oral - Nectar Straw -- Oral - Thin Teaspoon Holding of bolus;Reduced posterior propulsion Oral - Thin Cup -- Oral - Thin Straw -- Oral - Puree Reduced posterior propulsion;Holding of bolus Oral - Mech Soft -- Oral - Regular Impaired mastication Oral - Multi-Consistency -- Oral - Pill -- Oral Phase - Comment --  CHL IP PHARYNGEAL PHASE 01/19/2021 Pharyngeal Phase Impaired Pharyngeal- Pudding Teaspoon -- Pharyngeal -- Pharyngeal- Pudding Cup -- Pharyngeal -- Pharyngeal- Honey Teaspoon -- Pharyngeal -- Pharyngeal- Honey Cup -- Pharyngeal -- Pharyngeal- Nectar Teaspoon -- Pharyngeal -- Pharyngeal- Nectar Cup -- Pharyngeal -- Pharyngeal- Nectar Straw -- Pharyngeal -- Pharyngeal- Thin Teaspoon Reduced laryngeal elevation Pharyngeal -- Pharyngeal- Thin Cup Reduced laryngeal elevation Pharyngeal -- Pharyngeal- Thin Straw -- Pharyngeal -- Pharyngeal- Puree Reduced laryngeal elevation Pharyngeal -- Pharyngeal- Mechanical Soft -- Pharyngeal -- Pharyngeal- Regular Reduced laryngeal elevation Pharyngeal -- Pharyngeal- Multi-consistency -- Pharyngeal -- Pharyngeal- Pill -- Pharyngeal -- Pharyngeal Comment --  CHL IP CERVICAL ESOPHAGEAL PHASE 01/19/2021 Cervical Esophageal Phase (No Data) Pudding Teaspoon -- Pudding Cup -- Honey Teaspoon -- Honey Cup -- Nectar Teaspoon -- Nectar Cup -- Nectar Straw -- Thin Teaspoon -- Thin Cup -- Thin Straw -- Puree -- Mechanical Soft -- Regular -- Multi-consistency -- Pill -- Cervical Esophageal Comment -- Shanika I. Hardin Negus, [place], [place] Office number [phone] Pager [phone] Horton Marshall 01/19/2021, 3:22 PM              VAS [place] TRANSCRANIAL DOPPLER W BUBBLES  Result Date: 02/09/2021  Transcranial Doppler with Bubble Patient Name:  MICKY SHELLER  Date of Exam:   02/08/2021 Medical Rec #: [id number]     Accession #:     [id number] Date of Birth: 08-17-1955     Patient Gender: M Patient Age:   41 years Exam Location:  [facility] Procedure:      VAS [place] TRANSCRANIAL DOPPLER W BUBBLES Referring Phys: PRAMOD SETHI --------------------------------------------------------------------------------  Indications: Stroke. History: DVT found 12/12/20. Comparison Study: No prior study Performing Technologist: Sharion Dove RVS  Examination Guidelines: A complete evaluation includes B-mode imaging, spectral Doppler, color Doppler, and power Doppler as needed of all accessible portions of each vessel. Bilateral testing is considered an  integral part of a complete examination. Limited examinations for reoccurring indications may be performed as noted.  Summary: No HITS at rest or during Valsalva. Negative transcranial Doppler Bubble study with no evidence of right to left intracardiac communication.  A vascular evaluation was performed. The left middle cerebral artery was studied. An IV was inserted into the patient's right central line. Verbal informed consent was obtained.  NEGATIVE TCD BUbble study *See table(s) above for TCD measurements and observations.  Diagnosing physician: Antony Contras MD Electronically signed by Antony Contras MD on 02/09/2021 at 8:24:18 AM.    Final    VAS US CAROTID  Result Date: 02/09/2021 Carotid Arterial Duplex Study Patient Name:  Thomas [name]  Date of Exam:   02/08/2021 Medical Rec #: [id number]     Accession #:    [id number] Date of Birth: May 20, 1955     Patient Gender: M Patient Age:   21 years Exam Location:  [facility] Procedure:      VAS US CAROTID Referring Phys: PRAMOD SETHI --------------------------------------------------------------------------------  Indications:       CVA. Risk Factors:      Hypertension, hyperlipidemia, Diabetes, past history of                    smoking. Other Factors:     Atrial fibrillation, DVT, on Eliquis. Obstructive sleep                    apnea. Limitations         Today's exam was limited due to trach collar, central line,                    dialysis catheter, coughing. Comparison Study:  No prior study on file Performing Technologist: Sharion Dove RVS  Examination Guidelines: A complete evaluation includes B-mode imaging, spectral Doppler, color Doppler, and power Doppler as needed of all accessible portions of each vessel. Bilateral testing is considered an integral part of a complete examination. Limited examinations for reoccurring indications may be performed as noted.  Right Carotid Findings: +----------+--------+--------+--------+------------------+------------------+           PSV cm/sEDV cm/sStenosisPlaque DescriptionComments           +----------+--------+--------+--------+------------------+------------------+ CCA Prox  65      20                                intimal thickening +----------+--------+--------+--------+------------------+------------------+ CCA Distal49      19                                intimal thickening +----------+--------+--------+--------+------------------+------------------+ ICA Prox  61      27                                                   +----------+--------+--------+--------+------------------+------------------+ ICA Distal88      43                                                   +----------+--------+--------+--------+------------------+------------------+ ECA       66  11                                                   +----------+--------+--------+--------+------------------+------------------+ +----------+--------+-------+------------+-------------------+           PSV cm/sEDV cmsDescribe    Arm Pressure (mmHG) +----------+--------+-------+------------+-------------------+ Subclavian               Not assessed                    +----------+--------+-------+------------+-------------------+ +---------+--------+--------+------------+ VertebralPSV cm/sEDV  cm/sNot assessed +---------+--------+--------+------------+  Left Carotid Findings: +----------+--------+--------+--------+------------------+------------------+           PSV cm/sEDV cm/sStenosisPlaque DescriptionComments           +----------+--------+--------+--------+------------------+------------------+ CCA Prox  58      21                                intimal thickening +----------+--------+--------+--------+------------------+------------------+ CCA Distal75      21                                intimal thickening +----------+--------+--------+--------+------------------+------------------+ ICA Prox  73      22              heterogenous                         +----------+--------+--------+--------+------------------+------------------+ ICA Distal73      30                                                   +----------+--------+--------+--------+------------------+------------------+ ECA       14                                                           +----------+--------+--------+--------+------------------+------------------+ +----------+--------+--------+------------+-------------------+           PSV cm/sEDV cm/sDescribe    Arm Pressure (mmHG) +----------+--------+--------+------------+-------------------+ Subclavian                Not assessed                    +----------+--------+--------+------------+-------------------+ +---------+--------+--+--------+--+---------+ VertebralPSV cm/s63EDV cm/s22Antegrade +---------+--------+--+--------+--+---------+   Summary: Right Carotid: The extracranial vessels were near-normal with only minimal wall                thickening or plaque. Left Carotid: The extracranial vessels were near-normal with only minimal wall               thickening or plaque. Vertebrals:  Left vertebral artery demonstrates antegrade flow. Right vertebral              artery was not visualized. Subclavians: Bilateral  subclavian arteries were not visualized. *See table(s) above for measurements and observations.  Electronically signed by Antony Contras MD on 02/09/2021 at 8:20:41 AM.    Final    VAS [place] TRANSCRANIAL DOPPLER  Result Date:  02/09/2021  Transcranial Doppler Patient Name:  Thomas [name]  Date of Exam:   02/08/2021 Medical Rec #: [id number]     Accession #:    [id number] Date of Birth: 08-Jun-1955     Patient Gender: M Patient Age:   10 years Exam Location:  [facility] Procedure:      VAS [place] TRANSCRANIAL DOPPLER Referring Phys: PRAMOD SETHI --------------------------------------------------------------------------------  Indications: Stroke. History: DVT found 8/22. Limitations: trach Comparison Study: No prior study Performing Technologist: Sharion Dove RVS  Examination Guidelines: A complete evaluation includes B-mode imaging, spectral Doppler, color Doppler, and power Doppler as needed of all accessible portions of each vessel. Bilateral testing is considered an integral part of a complete examination. Limited examinations for reoccurring indications may be performed as noted.  +----------+-------------+----------+-----------+------------------+ RIGHT TCD Right VM (cm)Depth (cm)Pulsatility     Comment       +----------+-------------+----------+-----------+------------------+ MCA           81.00                 0.92                       +----------+-------------+----------+-----------+------------------+ ACA                                         unable to insonate +----------+-------------+----------+-----------+------------------+ Term ICA      55.00                 1.08                       +----------+-------------+----------+-----------+------------------+ PCA           21.00                 0.83                       +----------+-------------+----------+-----------+------------------+ Opthalmic     17.00                 1.57                        +----------+-------------+----------+-----------+------------------+ ICA siphon                                  unable to insonate +----------+-------------+----------+-----------+------------------+ Vertebral                                         trach        +----------+-------------+----------+-----------+------------------+ Distal ICA                                        trach        +----------+-------------+----------+-----------+------------------+  +----------+------------+----------+-----------+-------+ LEFT TCD  Left VM (cm)Depth (cm)PulsatilityComment +----------+------------+----------+-----------+-------+ MCA          95.00                 1.17            +----------+------------+----------+-----------+-------+ ACA          -35.00  1.17            +----------+------------+----------+-----------+-------+ Term ICA     101.00                1.18            +----------+------------+----------+-----------+-------+ PCA          13.00                 0.94            +----------+------------+----------+-----------+-------+ Opthalmic    34.00                  1.5            +----------+------------+----------+-----------+-------+ ICA siphon   41.00                 0.81            +----------+------------+----------+-----------+-------+ Vertebral                                   trach  +----------+------------+----------+-----------+-------+ Distal ICA                                  trach  +----------+------------+----------+-----------+-------+  +------------+-----+-------+             VM cmComment +------------+-----+-------+ Prox Basilar      trach  +------------+-----+-------+ Summary:  Poor suboccipital window and poor temporal windows limit evaluation.Mildly elevated mean flow velocities in terminal left internal carotid, and bilateral middle cerebral arteries indicates mild stenosis in these vessels. *See  table(s) above for TCD measurements and observations.  Diagnosing physician: Antony Contras MD Electronically signed by Antony Contras MD on 02/09/2021 at 8:23:43 AM.    Final     Labs:  CBC: Recent Labs    01/30/21 1937 01/31/21 7824 02/03/21 0647 02/07/21 0952  WBC 23.9* 19.1* 18.7* 18.4*  HGB 9.2* 8.5* 8.5* 8.7*  HCT 26.7* 25.5* 25.4* 26.4*  PLT 381 324 381 336    COAGS: Recent Labs    11/30/20 2205 12/08/20 0405 12/09/20 0916 12/10/20 0412 01/02/21 0604 01/02/21 1411 01/03/21 0129 01/03/21 1015  INR 1.5*  --  1.7*  --   --   --   --   --   APTT 22*   < >  --    < > 76* 86* 149* 110*   < > = values in this interval not displayed.    BMP: Recent Labs    02/06/21 0434 02/07/21 0258 02/08/21 0500 02/09/21 0415  NA 139 137 138 139  K 4.3 3.7 3.4* 3.5  CL 105 102 101 103  CO2 20* 19* 24 22  GLUCOSE 133* 134* 165* 165*  BUN 72* 32* 28* 46*  CALCIUM 9.0 9.0 9.0 9.1  CREATININE 6.71* 4.22* 4.13* 5.47*  GFRNONAA 9* 15* 15* 11*    LIVER FUNCTION TESTS: Recent Labs    01/17/21 0845 01/18/21 0352 01/19/21 0230 01/20/21 0516 01/29/21 1233 01/30/21 1959 02/06/21 0434 02/07/21 0258 02/08/21 0500 02/09/21 0415  BILITOT 0.8 0.9 0.7  --  0.6  --   --   --   --   --   AST 12* 16 14*  --  25  --   --   --   --   --   ALT 16 16 15   --  31  --   --   --   --   --  ALKPHOS 103 117 109  --  116  --   --   --   --   --   PROT 8.2* 8.3* 8.1  --  7.9  --   --   --   --   --   ALBUMIN 2.7* 2.8* 2.8*  2.8*   < > 2.7*   < > 2.6* 2.8* 2.9* 2.8*   < > = values in this interval not displayed.    TUMOR MARKERS: Recent Labs    10/08/20 0948  CEA <1.00    Assessment and Plan:  Dysphagia: Thomas [name], 65 year old male, is tentatively scheduled for 02/10/21 for an image-guided gastrostomy tube. Last dose of Lovenox was 02/08/21 at around 8 pm. Telephone consent was obtained from the patient's wife, Kahne Helfand. The patient was assessed at the bedside and was also able to  give verbal consent for the procedure.   Risks and benefits image guided gastrostomy tube placement was discussed with the patient including, but not limited to the need for a barium enema during the procedure, bleeding, infection, peritonitis and/or damage to adjacent structures.  All of the patient's questions were answered, patient is agreeable to proceed. He will be NPO/Hold tube feeds at midnight. AM labs have been ordered.   Consent signed and in IR  Thank you for this interesting consult.  I greatly enjoyed meeting Thomas [name] and look forward to participating in their care.  A copy of this report was sent to the requesting provider on this date.  Electronically Signed: Soyla Dryer, AGACNP-BC [phone] 02/09/2021, 3:41 PM   I spent a total of 20 Minutes    in face to face in clinical consultation, greater than 50% of which was counseling/coordinating care for gastrostomy tube.

## 2021-02-09 NOTE — Progress Notes (Addendum)
[facility] PROGRESS NOTE  Thomas [name] FXT:[id number] DOB: 04/14/1956 DOA: 11/30/2020 PCP: [name], NP  Status: Remains inpatient appropriate because:Persistent severe electrolyte disturbances, Unsafe d/c plan, and Inpatient level of care appropriate due to severity of illness  Dispo: The patient is from: Home              Anticipated d/c is to: SNF versus LTAC vs CIR              Patient currently is not medically stable to d/c.   Difficult to place patient Yes               Barriers to discharge: Currently has tracheostomy in place and is requiring dialysis but remains too weak to tolerate dialysis in chair which is a requirement for outpatient HD   Level of care: Progressive  Code Status: Full Family Communication: Attending physician spoke with wife at bedside 9/26 DVT prophylaxis: FD Lovenox COVID vaccination status: Moderna 07/08/2019 and 08/11/2019 with first booster dose given 08/05/2020   HPI:  65 y.o. male with a history of T2DM with neuropathy, HTN, HLD., colon cancer s/p resection and colostomy May 2022 s/p 3 cycles chemotherapy (last was July 2022). Followed by Dr. Benay Spice. Started CAPOX June 2022, reduced cepecitabine dose starting 11/19/2020 due to hand/foot syndrome, reduced oxaliplatin due to renal impairment and poor performance status. Presented to the ED 7/24 with abdominal pain found to be febrile, tachycardic, tachypneic. Lactic acid was 6.6 and persisted at 6.8 after sepsis IV fluid bolus and cefepime. He had ARF, demand ischemia, bandemia, was hyperglycemic with negative ketones, with lactic acidosis. He developed AFib with RVR in the ED, was started on diltiazem, and was admitted to the ICU for septic shock with unclear source. Sepsis was subsequently felt to be ruled out at time of admission, presentation felt more likely to be related to cepacitabine toxicity with oral mucositis, hand/foot syndrome, and diarrhea/high output colostomy.   He was transferred  to hospitalist service 7/27 but developed metabolic encephalopathy with hyoxia due to aspiration pneumonia on 7/30 requiring intubation and pressor support. Failed extubation with inability to clear secretions, requiring reintubation 8/8, and tracheostomy subsequently placed 8/11. This was pulled out 8/22, replaced under bronch guidance, again removed 8/25 requiring reintubation after failed reinsertion. ENT revised tracheostomy in the OR 8/29. He has required IHD for ARF, and also had complications of RLE DVT now on eliquis, colitis thought to be ischemic 8/2 not requiring specific intervention, Pseudomonas pneumonia having completed treatment, agitated encephalopathy which has improved, anemia requiring 4u PRBCs total, and thrombocytopenia which has resolved. On 9/4 he was transferred to hospitalist service having tolerated trach collar.  Patient was also evaluated by Dr. Benay Spice during this admission clarified that patient was receiving curative chemotherapy prior to admission  Subjective: Very frustrated that he cannot eat anything by mouth.  States nausea and vomiting have resolved.  Eager to have follow-up swallowing evaluation as soon as possible  Objective: Vitals:   02/09/21 0741 02/09/21 0746  BP: 133/70   Pulse: 90 90  Resp:  19  Temp: 97.9 F (36.6 C)   SpO2: 98% 100%    Intake/Output Summary (Last 24 hours) at 02/09/2021 0749 Last data filed at 02/09/2021 0000 Gross per 24 hour  Intake 375 ml  Output 250 ml  Net 125 ml   Filed Weights   02/07/21 1645 02/08/21 0332 02/09/21 0500  Weight: 97.2 kg 97.9 kg 97.8 kg    Exam: Constitutional: NAD, calm-came anxious  due to significant pain in left heel during examination Respiratory: #6 XLT uncuffed trach, FiO2 28% trach collar, lung sounds remain clear, no increased work of breathing, no excessive tracheal secretions-normal pulse oximetry Cardiovascular: Heart sounds S1-S2, no peripheral edema, pulse remains regular Abdomen:  Abdomen soft and not distended.  No further pain with palpation.  Normoactive bowel sounds stoma pink with thick liquid stool in bedside drainage bag-Cortrak clogged and after discussion with SLP plans are to remove for MBS testing today Genitourinary: Foley catheter in place with yellow urine noted in drainage bag Neurologic: CN 2-12 grossly intact. Sensation intact, Strength 3/5 x all 4 extremities.  Psychiatric: Normal judgment and insight. Alert and oriented x 3.     Assessment/Plan: Acute problems:  Acute hypoxic respiratory failure s/p tracheostomy due to aspiration pneumonia  Recurrent pansensitive Pseudomonas tracheobronchitis -Tolerating PMV.  Trach team wants to make sure patient more mobile with stronger vocalization before attempt capping trials. -Continue Robinul  -SLP following for PMV training.  Grade LA C reflux esophagitis/nonbleeding duodenal ulcers -EGD 9/30 with the above findings -H. pylori pending -Patient needs to continue PPI for total of 4-weeks-have transitioned Protonix and Pepcid to oral route -For now continue scheduled Zofran -Follow-up EGD recommended after discharge  Dysphagia/calorie malnutrition -Failed MBSS-SLP d/w wife PEG and she is agreeable- I discussed with pt ans he is agreeable -pt requesting Cortrak not be replaced-per tube meds dc'd and placed on IV equivalent -Begin D5NS w/ KCL at 75 until can resume TFs-today K+ was 3.5  Inability to elevate tongue/MRI with bilateral punctate lesion (Frontal watershed) -SLP evaluated and noted patient is unable to elevate tongue properly bilaterally-continues to have difficulty with swallowing and phonation -Imaging consistent with acute infarcts -Stroke team following-etiology of infarcts yet to be determined-differential includes thrombotic etiology from either A. fib or from DVT if has PFO -In regards to difficulty with elevating tongue, it is very rare to have bilateral hypoglossal nerve injury in  context of intubated/reintubation--neuro suspects related to mucositis, oral thrush, hand/foot syndrome -Continue Lovenox -statin currently on hold due to recent GI issues, Hgba1c 7/25 was 8.7; HDL cholesterol is low, LDL cholesterol at goal -Recommendation is follow-up MRI in 6 to 8 weeks with and without contrast since not clear as to whether this could also be metastatic in etiology  Acute pain Increase frequency of Fentanyl to q 2 hrs prn- continue same low dose of 12.5 mcg  Left heel wound Attempted to remove dressing over heel and patient began to cry out loudly and weep-I was concerned over a possible DTI or heel ulcer Wound care nurse evaluated and patient did not have any wounds only dry skin Foam dressing ordered by wound care nurse Heel protectors also ordered  Urinary retention -Has completed Diflucan as of 9/28 -Given recent funguria we will go ahead and discontinue Foley catheter and begin serial bladder scans-if Foley has to be replaced we will need to consult urology   Acute renal failure on stage IIIb CKD: Presumed ischemic ATN -started CRRT 7/31 > iHD 8/12 s/p left IJ [facility] 8/15.   - Continue HD TTS -Still unclear if will recover renal function   High output ostomy, diarrhea:  -Infectious work-up negative -Recent PSBO likely caused by Imodium and discontinued  Funguria/Thrush -Diflucan completed 9/27 -Continue nystatin   Anemia of critical illness, acute blood loss as well as anemia of chronic disease and iron deficiency:  - s/p 4u PRBCs this hospitalization  - IV iron TTS x8 doses per nephrology. -  Recent hemoglobin stable at 10.6 with normal platelets   Stage IIIb colon CA s/p partial transverse colectomy with end colostomy May 2022:  - Palliative care has been involved during this hospitalization- goals remain clear: full aggressive measures desired.   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   Uncontrolled T2DM on OHAs prior to admission with associated neuropathy:  HbA1c 8.7% at admission. - Continue levemir and SSI  -Continue to hold gabapentin and gradually reintroduce medications   HTN:  -Norvasc held secondary to recent nausea and vomiting -BP has been relatively well controlled    HLD:  -Atorvastatin on hold due to nausea and vomiting   Thrombocytopenia:  -Transient likely due to critical illness. HIT Ab negative on 8/5.    ?Sepsis - initially thought to be present on admission- has been ruled out.    Pressure Injury 02/05/21 Coccyx Lower Stage 2 -  Partial thickness loss of dermis presenting as a shallow open injury with a red, pink wound bed without slough. healing quarter sized spot on sacrum (Active)  Date First Assessed/Time First Assessed: 02/05/21 0800   Location: Coccyx  Location Orientation: Lower  Staging: Stage 2 -  Partial thickness loss of dermis presenting as a shallow open injury with a red, pink wound bed without slough.  Wound Descript...    Assessments 02/05/2021  5:10 PM 02/08/2021  8:00 PM  Dressing Type Foam - Lift dressing to assess site every shift Foam - Lift dressing to assess site every shift  Dressing Changed Clean;Dry;Intact  State of Healing Early/partial granulation --  Site / Wound Assessment Pink --  % Wound base Red or Granulating 100% --  Wound Length (cm) 2 cm --  Wound Width (cm) 2 cm --  Wound Surface Area (cm^2) 4 cm^2  --  Margins Attached edges (approximated) --  Drainage Amount None --  Treatment Cleansed --     No Linked orders to display     Data Reviewed: Basic Metabolic Panel: Recent Labs  Lab 02/05/21 0244 02/05/21 2159 02/06/21 0434 02/07/21 0258 02/08/21 0500 02/09/21 0415  NA 135 136 139 137 138 139  K 5.2* 4.2 4.3 3.7 3.4* 3.5  CL 98 103 105 102 101 103  CO2 21* 19* 20* 19* 24 22  GLUCOSE 168* 185* 133* 134* 165* 165*  BUN 62* 72* 72* 32* 28* 46*  CREATININE 6.42* 6.84* 6.71* 4.22* 4.13* 5.47*  CALCIUM 9.5 9.0 9.0 9.0 9.0 9.1  MG 2.0  --  1.9 1.8 1.8 1.9  PHOS 5.2* 5.8* 6.3* 2.3* 4.2 3.8   Liver Function Tests: Recent Labs  Lab 02/05/21 2159 02/06/21 0434 02/07/21 0258 02/08/21 0500 02/09/21 0415  ALBUMIN 2.7* 2.6* 2.8* 2.9* 2.8*    CBC: Recent Labs  Lab 02/03/21 0647 02/07/21 0952  WBC 18.7* 18.4*  HGB 8.5* 8.7*  HCT 25.4* 26.4*  MCV 85.5 85.4  PLT 381 336    CBG: Recent Labs  Lab 02/08/21 1635 02/08/21 1954  02/09/21 0002 02/09/21 0406 02/09/21 0744  GLUCAP 195* 188* 158* 150* 190*    Scheduled Meds:  chlorhexidine gluconate (MEDLINE KIT)  15 mL Mouth Rinse BID   Chlorhexidine Gluconate Cloth  6 each Topical Q0600   enoxaparin (LOVENOX) injection  100 mg Subcutaneous Q24H   feeding supplement (NEPRO CARB STEADY)  1,000 mL Per Tube Q24H   feeding supplement (PROSource TF)  45 mL Per Tube BID   hydrocerin   Topical BID   insulin aspart  0-15 Units Subcutaneous Q4H   insulin detemir  10 Units Subcutaneous Daily   mouth rinse  15 mL Mouth Rinse 10 times per day   nystatin  5 mL Mouth/Throat QID   ondansetron  4 mg Intravenous Q6H   pantoprazole (PROTONIX) IV  40 mg Intravenous Q12H   scopolamine  1 patch Transdermal Q72H   Continuous Infusions:  sodium chloride Stopped (12/20/20 0955)   anticoagulant sodium citrate     dextrose 5 % and 0.9% NaCl 30 mL/hr at 02/06/21 1830   famotidine (PEPCID) IV Stopped (02/08/21 1303)   levETIRAcetam 500 mg  (02/08/21 2131)   promethazine (PHENERGAN) injection (IM or IVPB) 12.5 mg (02/05/21 0416)    Principal Problem:   Diabetes mellitus with hyperosmolarity without hyperglycemic hyperosmolar nonketotic coma (HCC) Active Problems:   Lactic acidosis   Acute renal failure superimposed on stage 2 chronic kidney disease (HCC)   Metabolic acidosis   Tachycardia   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 (HCC)   Adverse effect of chemotherapy   Pressure injury of skin   Atrial fibrillation with RVR (HCC)   Sepsis (HCC)   Malnutrition of moderate degree   Acute metabolic encephalopathy   Acute respiratory failure with hypoxemia (HCC)   Acute renal failure with acute cortical necrosis (HCC)   Shock ([place])   Status post tracheostomy ([place])   Endotracheal tube present   Hemodialysis status ([place])   Palliative care by specialist   SOB (shortness of breath)   Non-intractable vomiting   Abdominal pain, epigastric   Cerebral embolism with cerebral infarction   Acute esophagitis   Duodenal ulcer disease   Consultants: PCCM General surgery Nephrology Hematology/oncology Palliative care medicine team  Procedures: 2u PRBC 7/31, 1u PRBC 8/17, 1u PRBC 8/27 Tracheostomy revision 01/05/2021 Dr. Constance Holster [facility] 8/22    Antibiotics: Vancomycin 7/24 Cefepime 7/24 - 7/27 Unasyn 7/30 >> cefepime/flagyl 7/31 - 8/6     Time spent: 40 minutes    [name] ANP  [facility] 7 am - 330 pm/M-F for direct patient care and secure chat Please refer to Amion for contact info 70  days

## 2021-02-09 NOTE — Progress Notes (Signed)
PT Cancellation Note  Patient Details Name: Thomas [name] MRN: [id number] DOB: October 24, 1955   Cancelled Treatment:    Reason Eval/Treat Not Completed: Patient declined, no reason specified (pt refused all mobility, standing, sitting EOB, HEP due to bil Heel pain. Pt with heels on bed and floated on pillow)   Mc Bloodworth B Rawson Minix 02/09/2021, 8:57 AM Bayard Males, PT Acute Rehabilitation Services Pager: [phone] Office: [phone]

## 2021-02-09 NOTE — Progress Notes (Signed)
ANTICOAGULATION CONSULT NOTE Pharmacy Consult for Enoxaparin Indication: DVT  Allergies  Allergen Reactions   Morphine And Related Other (See Comments)    Throat swelling    Patient Measurements: Height: 5' 8"  (172.7 cm) Weight: 97.8 kg (215 lb 9.8 oz) IBW/kg (Calculated) : 68.4  Vital Signs: Temp: 97.9 F (36.6 C) (10/03 0741) Temp Source: Axillary (10/03 0400) BP: 133/70 (10/03 0741) Pulse Rate: 93 (10/03 1207)  Labs: Recent Labs    02/07/21 0258 02/07/21 0952 02/08/21 0500 02/09/21 0415  HGB  --  8.7*  --   --   HCT  --  26.4*  --   --   PLT  --  336  --   --   HEPRLOWMOCWT  --   --  1.06  --   CREATININE 4.22*  --  4.13* 5.47*    Estimated Creatinine Clearance: 15.5 mL/min (A) (by C-G formula based on SCr of 5.47 mg/dL (H)).   Medications:  Scheduled:   chlorhexidine gluconate (MEDLINE KIT)  15 mL Mouth Rinse BID   Chlorhexidine Gluconate Cloth  6 each Topical Q0600   enoxaparin (LOVENOX) injection  100 mg Subcutaneous Q24H   feeding supplement (NEPRO CARB STEADY)  1,000 mL Per Tube Q24H   feeding supplement (PROSource TF)  45 mL Per Tube BID   hydrocerin   Topical BID   insulin aspart  0-15 Units Subcutaneous Q4H   insulin detemir  10 Units Subcutaneous Daily   mouth rinse  15 mL Mouth Rinse 10 times per day   nystatin  5 mL Mouth/Throat QID   ondansetron  4 mg Intravenous Q6H   pantoprazole (PROTONIX) IV  40 mg Intravenous Q12H   scopolamine  1 patch Transdermal Q72H   Infusions:   sodium chloride Stopped (12/20/20 0955)   anticoagulant sodium citrate     dextrose 5 % and 0.9 % NaCl with KCl 20 mEq/L 75 mL/hr at 02/09/21 1517   dextrose 5 % and 0.9% NaCl 30 mL/hr at 02/06/21 1830   famotidine (PEPCID) IV     levETIRAcetam 500 mg (02/09/21 1002)   promethazine (PHENERGAN) injection (IM or IVPB) 12.5 mg (02/05/21 0416)    Assessment: Pt diagnosed with new RLE DVT on 8/05 and was subsequently started on apixaban for treatment. On 9/28 patient  developed GI obstructive symptoms and was switched to therapeutic enoxaparin. He was started on 90 mg q24h and after his 4th dose his anti-xa level was 1.06. Enoxaparin was increased to 198m Q24H. Patient was transitioned back to apixaban 10/02 however Cortrak clogged 10/03 before dose could be given. Will resume therapeutic enoxaparin.   Goal of Therapy:  Anti-Xa level 0.6-1 units/ml 4hrs after LMWH dose given Monitor platelets by anticoagulation protocol: Yes   Plan:  Resume enoxaparin 102mQ24H Check anti-Xa levels as necessary and CBC daily Continue to monitor H&H and platelets Follow-up transition back to Apixaban when able  Thank you for allowing pharmacy to be a part of this patient's care.  AdArdyth HarpsPharmD Clinical Pharmacist

## 2021-02-09 NOTE — Progress Notes (Signed)
Modified Barium Swallow Progress Note  Patient Details  Name: Thomas [name] MRN: [id number] Date of Birth: 08-12-1955  Today's Date: 02/09/2021  Modified Barium Swallow completed.  Full report located under Chart Review in the Imaging Section.  Brief recommendations include the following:  Clinical Impression  Pt continues to present with a severe oral and pharyngeal dysphagia with improved visibility of the pharyngeal phase during today's study.  There was limited mobility of the tongue, with some elevation of the superior surface, but poor tongue tip elevation and base of tongue retraction. The chair was moved to a reclined position in an effort to allow gravity to assist with bolus transfer.  Five and ten ml boluses of nectar and thin liquid were administered from a syringe into mid-oral cavity.  Pt demonstrated effort to manipulate material; however, liquids generally transitioned passively into pharynx.  Repetitive attempts to swallow were observed - there was severe impairment in the mobility of the larynx, limited pharyngeal squeeze, and incomplete epiglottic inversion over the larynx.  Liquids filled the valleculae and pyriforms and spilled into the airway before, during, and after efforts to swallow.  Aspiration did not generally elicit a cough response.  Study was discontinued.  MBS was observed in real time by pt's wife and daughter.  We discussed results.  Results were also reviewed with [name].  Etiology of such severe dysphagia is unclear.  Neurological findings would not generally lead to such severe impairment. Pt's voice is improving (PMV in use during study), and his deficits are not consistent with the type of laryngeal trauma associated with oral intubation. There is potential for the inflammation seen on EGD and degree of thrush on tongue to also impact pharynx/larynx (not visible via MBS and not noted per EGD) and interfere with function. For now, recommend continuing NPO  except ice chips. Mr. and [name] have been amenable to discussing PEG.  SLP will follow for therapeutic exercises if he is able and will explore potential for free water protocol.   Swallow Evaluation Recommendations       SLP Diet Recommendations: Other (Comment) (consider PEG); allow ice chips       Medication Administration: Via alternative means               Oral Care Recommendations: Oral care QID (encourage pt to complete his own oral care)      Cierrah Dace L. Tivis Ringer, [place] Office number [phone] Pager [phone]   [name] 02/09/2021,3:32 PM

## 2021-02-09 NOTE — Progress Notes (Signed)
Speech Language Pathology Treatment: Dysphagia  Patient Details Name: Thomas [name] MRN: [id number] DOB: [birth year] Today's Date: 02/09/2021 Time: 1130-1150 SLP Time Calculation (min) (ACUTE ONLY): 20 min  Assessment / Plan / Recommendation Clinical Impression  Pt initially refused therapy; returned a few minutes later when his wife and daughter were present, and he was willing to participate.  His mood was much improved with their encouragement and praise.  Tongue looks marginally better than last week; still with thrush and being treated with Nystatin.  He has limited movement of tongue; neurology saw last week. Dx bilateral frontal infarcts -this would not explain focal tongue deficits.   He was finally willing to accept some sips of apple juice; asked for cola, which he swallowed with no overt s/s of aspiration. PMV in place.  Demonstrated improved lip seal and some ability to propel bolus backward without tipping his head back.  With help of his wife and daughter, persuaded Thomas [name] to participate in a repeat MBS today. (Last study was 9/12- we had discussed a FEES as an alternative, but it is doubtful he would tolerate the procedure with his anxiety.)   His clinical performance today is encouraging. MBS scheduled for  1330.    HPI HPI: Pt is a 65 y/o male admitted on 7/25 for worsening sepsis vs reaction to chemotherapy with high ostomy output.  Pt developed worsening hypoxia and was requiring BIPAP w/ FIO2 of 100% and increased work of breathing. SLP consulted due to concern for aspiration. BSE 7/28 recommended NPO status and MBS, but this could not completed subsequently due to lethargy. CXR in AM of 7/30 showed some worsening concern of aspiration. ETT 7/30-8/8; tolerated nasal cannula for about an hour; reintubated 8/8-trach 8/11. Pt pulled out trach x2 8/22; replaced by PCCM. Bronch on 8/22 revealed endotracheal granulation tissue at trach site. Trach removed again on 8/25; unable to  reinsert despite multiple attenpts; pt reintubated 8/25- trach by ENT on 8/29. BSE 8/22 limited to ice chips due to pt's refusal of other consistencies; rx NPO status with Cortrak.EGD 9/30:grade LA C reflux esophagitis/non-bleeding duodenal ulcers.  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      SLP Plan  Continue with current plan of care;MBS      Recommendations for follow up therapy are one component of a multi-disciplinary discharge planning process, led by the attending physician.  Recommendations may be updated based on patient status, additional functional criteria and insurance authorization.    Recommendations         Patient may use Passy-Muir Speech Valve: During all waking hours (remove during sleep) PMSV Supervision: Intermittent         Oral Care Recommendations: Oral care QID;Patient independent with oral care (enourage pt to complete his own oral care) Follow up Recommendations: Skilled Nursing facility SLP Visit Diagnosis: Dysphagia, oropharyngeal phase (R13.12) Plan: Continue with current plan of care;MBS       Kewana Sanon L. Tivis Ringer, [place] Office number [phone] Pager [phone]                 Thomas [name]  02/09/2021, 11:57 AM

## 2021-02-09 NOTE — Progress Notes (Signed)
[name] Progress Note   65 year old gentleman with a history of colon cancer status post resection and colostomy May 2022.  Status post 3 cycles of chemotherapy last was in July 2022.  Type 2 diabetes with neuropathy hypertension hyperlipidemia presented to the emergency room 11/30/2020 with abdominal pain.  Was found to be secondary to sepsis.  Patient also was found to have acute kidney injury.  Developed atrial fibrillation with rapid ventricular rate and was admitted to intensive care unit for septic shock.  On 12/03/2020 he developed metabolic encephalopathy with hypoxia and aspiration pneumonia and required intubation 12/06/2020.  He also required pressor support.  He was reintubated since 12/15/2020 tracheostomy placed.  His hospital course  complicated by DVT.  It appears that he has recurrent Pseudomonas pneumonia.  He also has recurrent anemia requiring transfusion of 4 units of packed [name] blood cells.  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 CRRT initiated 7/31 and transitioned to IHD on 12/19/20.  S/p LIJ TDC placement 12/22/20.  We will closely monitor for signs of recovery but urine output has been fairly minimal  Short session of dialysis Sat.  Maintain TTS schedule for now.   Monitor for signs of recovery but seems unlikely at this point    2.Urinary retention   Foley catheter in place     3.Volume overload/anasarca-improved with dialysis.   4.Sepsis/aspiration pneumonia/VDRF:aspiration pneumonia.  Tracheostomy placed   4.Stage IIIB colon cancer - sp partial colectomy, colostomy, chemoRx sp 3 cycles.   5.Right leg DVT: On Eliquis.   6.Atrial fibrillation - on amiodarone and anticoagulation.     7.Anemia of critical illness: DC'ed  IV iron as concern for infection and on abx, transfuse as needed, holding erythropoietin with active malignancy.  Underwent EGD on 9/30.    8. CKD-MBD; started sevelamer for hyperphosphatemia on 9/8, monitor phosphorus level.--improved 4.3-6.3  9. CVA, incidental:  neuro performed w/u and has signed off - f/u in clinic planned.    9. Disposition -  Seen by palliative care team.  Discussion ongoing for possible transfer to [facility].  The tracheostomy should be capped before he can be accepted to [facility]  Subjective:   Patient feels okay today.  Denies any specific complaints except sputum which he is able to suction himself. UOP 148m yesterday.   Objective:   BP 120/60 (BP Location: Left Arm)   Pulse 97   Temp 98 F (36.7 C) (Axillary)   Resp 16   Ht 5' 8"  (1.727 m)   Wt 97.8 kg   SpO2 100% Comment: Simultaneous filing. User may not have seen previous data.  BMI 32.78 kg/m   Intake/Output Summary (Last 24 hours) at 02/09/2021 06789Last data filed at 02/09/2021 0000 Gross per 24 hour  Intake 375 ml  Output 250 ml  Net 125 ml    Weight change: 0.1 kg  Physical Exam: General: Chronically ill-appearing, lying in bed, no distress  Heart: Normal rate Lungs: normal wob, bilateral chest rise, rhonchi without rales Abdomen:soft, Non-tender, non-distended Extremities: Minimal edema, warm and well perfused Neurology: Alert awake and following commands  Dialysis Access: LIJ TDC in place.   R chest wall port GU: foley draining cloudy urine, bladder nontender with palpation    Imaging: VAS [place]ANS[place]IAL DOPPLER W BUBBLES  Result Date: 02/08/2021  Transcranial Doppler with Bubble Patient Name:  Thomas [name] Date of Exam:  02/08/2021 Medical Rec #: [id number]     Accession #:    [id number] Date of Birth: 17-Jul-1955     Patient Gender: M Patient Age:   31 years Exam Location:  [facility] Procedure:      VAS [place] TRANSCRANIAL DOPPLER W BUBBLES Referring Phys: PRAMOD SETHI --------------------------------------------------------------------------------  Indications: Stroke. History: DVT found 12/12/20. Comparison  Study: No prior study Performing Technologist: Sharion Dove RVS  Examination Guidelines: A complete evaluation includes B-mode imaging, spectral Doppler, color Doppler, and power Doppler as needed of all accessible portions of each vessel. Bilateral testing is considered an integral part of a complete examination. Limited examinations for reoccurring indications may be performed as noted.  Summary: No HITS at rest or during Valsalva. Negative transcranial Doppler Bubble study with no evidence of right to left intracardiac communication.  A vascular evaluation was performed. The left middle cerebral artery was studied. An IV was inserted into the patient's right central line. Verbal informed consent was obtained.  *See table(s) above for TCD measurements and observations.    Preliminary    VAS US CAROTID  Result Date: 02/08/2021 Carotid Arterial Duplex Study Patient Name:  Thomas [name]  Date of Exam:   02/08/2021 Medical Rec #: [id number]     Accession #:    [id number] Date of Birth: July 06, 1955     Patient Gender: M Patient Age:   72 years Exam Location:  [facility] Procedure:      VAS US CAROTID Referring Phys: PRAMOD SETHI --------------------------------------------------------------------------------  Indications:       CVA. Risk Factors:      Hypertension, hyperlipidemia, Diabetes, past history of                    smoking. Other Factors:     Atrial fibrillation, DVT, on Eliquis. Obstructive sleep                    apnea. Limitations        Today's exam was limited due to trach collar, central line,                    dialysis catheter, coughing. Comparison Study:  No prior study on file Performing Technologist: Sharion Dove RVS  Examination Guidelines: A complete evaluation includes B-mode imaging, spectral Doppler, color Doppler, and power Doppler as needed of all accessible portions of each vessel. Bilateral testing is considered an integral part of a complete examination. Limited examinations  for reoccurring indications may be performed as noted.  Right Carotid Findings: +----------+--------+--------+--------+------------------+------------------+           PSV cm/sEDV cm/sStenosisPlaque DescriptionComments           +----------+--------+--------+--------+------------------+------------------+ CCA Prox  65      20                                intimal thickening +----------+--------+--------+--------+------------------+------------------+ CCA Distal49      19                                intimal thickening +----------+--------+--------+--------+------------------+------------------+ ICA Prox  61      27                                                   +----------+--------+--------+--------+------------------+------------------+  ICA Distal88      43                                                   +----------+--------+--------+--------+------------------+------------------+ ECA       66      11                                                   +----------+--------+--------+--------+------------------+------------------+ +----------+--------+-------+------------+-------------------+           PSV cm/sEDV cmsDescribe    Arm Pressure (mmHG) +----------+--------+-------+------------+-------------------+ Subclavian               Not assessed                    +----------+--------+-------+------------+-------------------+ +---------+--------+--------+------------+ VertebralPSV cm/sEDV cm/sNot assessed +---------+--------+--------+------------+  Left Carotid Findings: +----------+--------+--------+--------+------------------+------------------+           PSV cm/sEDV cm/sStenosisPlaque DescriptionComments           +----------+--------+--------+--------+------------------+------------------+ CCA Prox  58      21                                intimal thickening  +----------+--------+--------+--------+------------------+------------------+ CCA Distal75      21                                intimal thickening +----------+--------+--------+--------+------------------+------------------+ ICA Prox  73      22              heterogenous                         +----------+--------+--------+--------+------------------+------------------+ ICA Distal73      30                                                   +----------+--------+--------+--------+------------------+------------------+ ECA       14                                                           +----------+--------+--------+--------+------------------+------------------+ +----------+--------+--------+------------+-------------------+           PSV cm/sEDV cm/sDescribe    Arm Pressure (mmHG) +----------+--------+--------+------------+-------------------+ Subclavian                Not assessed                    +----------+--------+--------+------------+-------------------+ +---------+--------+--+--------+--+---------+ VertebralPSV cm/s63EDV cm/s22Antegrade +---------+--------+--+--------+--+---------+   Summary: Right Carotid: The extracranial vessels were near-normal with only minimal wall                thickening or plaque. Left Carotid: The extracranial vessels were near-normal with only minimal wall  thickening or plaque. Vertebrals:  Left vertebral artery demonstrates antegrade flow. Right vertebral              artery was not visualized. Subclavians: Bilateral subclavian arteries were not visualized. *See table(s) above for measurements and observations.     Preliminary    VAS [place] TRANSCRANIAL DOPPLER  Result Date: 02/08/2021  Transcranial Doppler Patient Name:  Thomas [name]  Date of Exam:   02/08/2021 Medical Rec #: [id number]     Accession #:    [id number] Date of Birth: 03-22-1956     Patient Gender: M Patient Age:   41 years Exam Location:  [facility] Procedure:      VAS [place] TRANSCRANIAL DOPPLER Referring Phys: PRAMOD SETHI --------------------------------------------------------------------------------  Indications: Stroke. History: DVT found 8/22. Limitations: trach Comparison Study: No prior study Performing Technologist: Sharion Dove RVS  Examination Guidelines: A complete evaluation includes B-mode imaging, spectral Doppler, color Doppler, and power Doppler as needed of all accessible portions of each vessel. Bilateral testing is considered an integral part of a complete examination. Limited examinations for reoccurring indications may be performed as noted.  +----------+-------------+----------+-----------+------------------+ RIGHT TCD Right VM (cm)Depth (cm)Pulsatility     Comment       +----------+-------------+----------+-----------+------------------+ MCA           81.00                 0.92                       +----------+-------------+----------+-----------+------------------+ ACA                                         unable to insonate +----------+-------------+----------+-----------+------------------+ Term ICA      55.00                 1.08                       +----------+-------------+----------+-----------+------------------+ PCA           21.00                 0.83                       +----------+-------------+----------+-----------+------------------+ Opthalmic     17.00                 1.57                       +----------+-------------+----------+-----------+------------------+ ICA siphon                                  unable to insonate +----------+-------------+----------+-----------+------------------+ Vertebral                                         trach        +----------+-------------+----------+-----------+------------------+ Distal ICA                                        trach        +----------+-------------+----------+-----------+------------------+   +----------+------------+----------+-----------+-------+ LEFT  TCD  Left VM (cm)Depth (cm)PulsatilityComment +----------+------------+----------+-----------+-------+ MCA          95.00                 1.17            +----------+------------+----------+-----------+-------+ ACA          -35.00                1.17            +----------+------------+----------+-----------+-------+ Term ICA     101.00                1.18            +----------+------------+----------+-----------+-------+ PCA          13.00                 0.94            +----------+------------+----------+-----------+-------+ Opthalmic    34.00                  1.5            +----------+------------+----------+-----------+-------+ ICA siphon   41.00                 0.81            +----------+------------+----------+-----------+-------+ Vertebral                                   trach  +----------+------------+----------+-----------+-------+ Distal ICA                                  trach  +----------+------------+----------+-----------+-------+  +------------+-----+-------+             VM cmComment +------------+-----+-------+ Prox Basilar      trach  +------------+-----+-------+    Preliminary     Labs: BMET Recent Labs  Lab 02/04/21 0400 02/05/21 0244 02/05/21 2159 02/06/21 0434 02/07/21 0258 02/08/21 0500 02/09/21 0415  NA 135 135 136 139 137 138 139  K 3.6 5.2* 4.2 4.3 3.7 3.4* 3.5  CL 95* 98 103 105 102 101 103  CO2 25 21* 19* 20* 19* 24 22  GLUCOSE 146* 168* 185* 133* 134* 165* 165*  BUN 48* 62* 72* 72* 32* 28* 46*  CREATININE 4.86* 6.42* 6.84* 6.71* 4.22* 4.13* 5.47*  CALCIUM 9.3 9.5 9.0 9.0 9.0 9.0 9.1  PHOS 5.0* 5.2* 5.8* 6.3* 2.3* 4.2 3.8    CBC Recent Labs  Lab 02/03/21 0647 02/07/21 0952  WBC 18.7* 18.4*  HGB 8.5* 8.7*  HCT 25.4* 26.4*  MCV 85.5 85.4  PLT 381 336     Medications:     chlorhexidine gluconate (MEDLINE KIT)  15 mL Mouth  Rinse BID   Chlorhexidine Gluconate Cloth  6 each Topical Q0600   enoxaparin (LOVENOX) injection  100 mg Subcutaneous Q24H   feeding supplement (NEPRO CARB STEADY)  1,000 mL Per Tube Q24H   feeding supplement (PROSource TF)  45 mL Per Tube BID   hydrocerin   Topical BID   insulin aspart  0-15 Units Subcutaneous Q4H   insulin detemir  10 Units Subcutaneous Daily   mouth rinse  15 mL Mouth Rinse 10 times per day   nystatin  5 mL Mouth/Throat QID   ondansetron  4 mg Intravenous Q6H   pantoprazole (PROTONIX) IV  40  mg Intravenous Q12H   scopolamine  1 patch Transdermal Q72H     Justin Mend  02/09/2021, 7:12 AM

## 2021-02-09 NOTE — Progress Notes (Signed)
NAME:  Thomas [name], MRN:  [id number], DOB:  1956/02/21, LOS: 2 ADMISSION DATE:  11/30/2020, CONSULTATION DATE:  7/30 REFERRING MD:  Riccardo Dubin, CHIEF COMPLAINT:  Worsening hypoxia/dyspnea  History of Present Illness:  65 yo M admitted 7/24 for worsening sepsis vs reaction to chemotherapy with high ostomy output and soft BP on 7/25. Has had issues during his hospitalization with recurrent aspirations and more recently worsening respiratory status.  Most recently pt developed worsening hypoxia and was requiring BIPAP w/ FIO2 of 100% and increased work of breathing.   He was admitted to intensive care and subsequently had worsening clinical status requiring intubation, CRRT, multiple pressors and pneumonia along with CT findings 8/1 concerning for ischemic bowel.  Significant Hospital Events:  7/27 Transferred to [facility], stopped abx as sepsis resolved/ruled out 7/30 AM Worsened AME, more hypoxic with worsening renal funciton, new aspiration on abx restarted 7/30 PM Pt with increased o2 requirement on 100% FIO2 on BIPAP, d/w family who wanted to proceed with intubation, pt intubated 7/31 Tracheal aspirate grew moderate candida tropicalis  8/1 Some issues with tachycardia and hypotension overnight. Reproted high ostomy output.  GI PCR negative 8/2 down to 9 mcg of Levophed, however repeat CT abdomen/pelvis showed inflammatory wall thickening and fat stranding of the descending and sigmoid colon, new since prior, consistent with nonspecific infectious, inflammatory, or ischemic colitis.  Also new dense consolidative airspace opacity in the right lower and right middle lobes 8/3 further weaning NE. Weaning sedation. Full code, full scope per family meeting 8/2. Eraxis stopped (8/1-8/3) 8/4 off pressors, plt down to 103.  8/5 remains off pressors. Moving around more, not really following commands though  8/9 failed extubation 8/8, tolerated nasal cannula for about an hour, desaturations and inability to  clear secretions-reintubated 8/8 8/10 some agitation overnight 8/11 tracheostomy placement On trach collar since 8/16 8/22 pulled trach out, replaced under bronch guidance. Has proximal area of granulation tissue that is above the bottom of the trach tube.  8/25 Removed Trach, unable to reinsert, despite multiple attempts re-intubated 8/29 Anemia, 1 unit PRBC. ENT re-placed Trach in [place]. 9/8 tracheal aspirate growing pan-sensitive P. Aeruginosa. Started on Ceftazidine  9/19 Continues to have thick secretions.   Interim History / Subjective:  Last week when examined was still requiring rescue suctioning most days. He admits he is still needing rescue suctioning at least once per day. Underwent EGD this past week- found to have esophagitis and duodenal ulcers. Afebrile, on 5L , 21% TC.  Objective   Blood pressure 133/70, pulse 93, temperature 97.9 F (36.6 C), resp. rate 18, height 5\' 8"  (1.727 m), weight 97.8 kg, SpO2 100 %.    FiO2 (%):  [21 %-28 %] 21 %   Intake/Output Summary (Last 24 hours) at 02/09/2021 1446 Last data filed at 02/09/2021 0000 Gross per 24 hour  Intake 375 ml  Output 250 ml  Net 125 ml    Filed Weights   02/07/21 1645 02/08/21 0332 02/09/21 0500  Weight: 97.2 kg 97.9 kg 97.8 kg     Physical Exam: General: chronically ill appearing man lying in bed in NAD HEENT: [place]/AT, eyes anicteric Neck: shiley XLT proximal #6, no bleeding or drainage, PMV in place. Lungs: breathing comfortably, rhonchi bilaterally. No conversational dyspnea. CV: S1S2, RRR Abdomen: soft, ND  Extremities: mild pedal edema, no cyanosis Psych: calm, cooperative with exam. Seems to have appropriate insight. Neuro: Awake, alert, answering questions appropriately. Weak voice. Globally weak.  No updated micro results since  previous encounter.  Ct abd pelvis 9/29> atelectasis in R base vs less likely infiltrate.   Resolved Hospital Problem list   Shock  HHS Afib RVR  Lactic Acidosis   Thrombocytopenia PsA tracheobronchitis and colonization Colitis  Assessment & Plan:  Acute hypoxemic respiratory failure s/p tracheostomy Endotracheal granulation tissue-  Trach revision by ENT when removed and unable to be replaced at bedside. ENT changed on 8/29 to cuffless trach. Obstructive sleep apnea -Con't shiley #6 prox XLT. Not ready to downsize if secretions remain thick. -Con't PMV use as tolerated and working with SLP.   -Con't routine trach care -Adding CPT & hypertonic saline given ongoing thick secretions and need for rescue suctioning. I will avoid adding mucinex since we are already trying to dry secretions with multiple anticholinergic meds. Continue robinul, scopolamine patch unless we think these are contributing to thickened secretions, potentially slowing his vent weaning progress. -Con't ATC. -Con't focus on rehabilitation to strengthen respiratory and swallow muscles. -reculture sputum if febrile given atelectasis vs opacity in RLL.  Dysphagia, ongoing silent aspiration on MBSS today Oral thrush, reflux esophagitis likely contributing to dysphagia. -con't working with SLP -remain NPO per MBSS results; PEG has been recommended -on PPI & H2blocker -on oral nystatin  Per primary: Acute kidney injury on chronic kidney disease stage IIIb - on dialysis Hyponatremia Colorectal cancer: Completed chemotherapy July 2022 Lower extremity DVT- Continue Eliquis; will need to be held if if gets a PEG. Can use lovenox in that interim.   PCCM will continue to follow weekly and is available in the interim if questions arise.  Julian Hy, DO 02/09/21 3:53 PM [facility]

## 2021-02-09 NOTE — Consult Note (Signed)
WOC Nurse Consult Note: Patient receiving care in [facility] 2W28. Reason for Consult: right heel wound--the only thing found on the left heel was peeling, dry skin. The left heel, however, had linear open, partial thickness areas consistent with what would be found when peeling skin is removed. The patient has heavy amounts of thick, dry, peeling skin to bilateral feet. Wound type: Pressure Injury POA: Yes/No/NA Measurement: Wound bed: Drainage (amount, consistency, odor) no odor, no drainage. Periwound: Dressing procedure/placement/frequency: Place a foam dressing over the left heel. Monitor the appearance of the area each day.  The heels were being floated off of the mattress with pillows. Monitor the wound area(s) for worsening of condition such as: Signs/symptoms of infection,  Increase in size,  Development of or worsening of odor, Development of pain, or increased pain at the affected locations.  Notify the medical team if any of these develop.  Thank you for the consult. [place] nurse will not follow at this time.  Please re-consult the [place] team if needed.  Val Riles, RN, MSN, CWOCN, CNS-BC, pager [phone]

## 2021-02-10 ENCOUNTER — Inpatient Hospital Stay (HOSPITAL_COMMUNITY): Payer: Medicare HMO

## 2021-02-10 DIAGNOSIS — E1100 Type 2 diabetes mellitus with hyperosmolarity without nonketotic hyperglycemic-hyperosmolar coma (NKHHC): Secondary | ICD-10-CM | POA: Diagnosis not present

## 2021-02-10 HISTORY — PX: IR GASTROSTOMY TUBE MOD SED: IMG625

## 2021-02-10 LAB — RENAL FUNCTION PANEL
Albumin: 2.7 g/dL — ABNORMAL LOW (ref 3.5–5.0)
Anion gap: 12 (ref 5–15)
BUN: 58 mg/dL — ABNORMAL HIGH (ref 8–23)
CO2: 23 mmol/L (ref 22–32)
Calcium: 9.1 mg/dL (ref 8.9–10.3)
Chloride: 108 mmol/L (ref 98–111)
Creatinine, Ser: 6.26 mg/dL — ABNORMAL HIGH (ref 0.61–1.24)
GFR, Estimated: 9 mL/min — ABNORMAL LOW (ref >60)
Glucose, Bld: 158 mg/dL — ABNORMAL HIGH (ref 70–99)
Phosphorus: 3.8 mg/dL (ref 2.5–4.6)
Potassium: 3.5 mmol/L (ref 3.5–5.1)
Sodium: 143 mmol/L (ref 135–145)

## 2021-02-10 LAB — CBC
HCT: 24.2 % — ABNORMAL LOW (ref 39.0–52.0)
Hemoglobin: 8.1 g/dL — ABNORMAL LOW (ref 13.0–17.0)
MCH: 28.4 pg (ref 26.0–34.0)
MCHC: 33.5 g/dL (ref 30.0–36.0)
MCV: 84.9 fL (ref 80.0–100.0)
Platelets: 323 10*3/uL (ref 150–400)
RBC: 2.85 MIL/uL — ABNORMAL LOW (ref 4.22–5.81)
RDW: 14.8 % (ref 11.5–15.5)
WBC: 15.6 10*3/uL — ABNORMAL HIGH (ref 4.0–10.5)
nRBC: 0 % (ref 0.0–0.2)

## 2021-02-10 LAB — GLUCOSE, CAPILLARY
Glucose-Capillary: 124 mg/dL — ABNORMAL HIGH (ref 70–99)
Glucose-Capillary: 126 mg/dL — ABNORMAL HIGH (ref 70–99)
Glucose-Capillary: 143 mg/dL — ABNORMAL HIGH (ref 70–99)
Glucose-Capillary: 145 mg/dL — ABNORMAL HIGH (ref 70–99)
Glucose-Capillary: 146 mg/dL — ABNORMAL HIGH (ref 70–99)
Glucose-Capillary: 153 mg/dL — ABNORMAL HIGH (ref 70–99)

## 2021-02-10 LAB — PROTIME-INR
INR: 1.1 (ref 0.8–1.2)
Prothrombin Time: 14.4 seconds (ref 11.4–15.2)

## 2021-02-10 LAB — MAGNESIUM: Magnesium: 1.9 mg/dL (ref 1.7–2.4)

## 2021-02-10 MED ORDER — IOHEXOL 240 MG/ML SOLN
50.0000 mL | Freq: Once | INTRAMUSCULAR | Status: AC | PRN
  Administered 2021-02-10: 20 mL

## 2021-02-10 MED ORDER — GLUCAGON HCL RDNA (DIAGNOSTIC) 1 MG IJ SOLR
INTRAMUSCULAR | Status: DC | PRN
  Administered 2021-02-10: .5 mg via INTRAVENOUS

## 2021-02-10 MED ORDER — LORAZEPAM 2 MG/ML IJ SOLN
1.0000 mg | Freq: Four times a day (QID) | INTRAMUSCULAR | Status: DC | PRN
  Administered 2021-02-10 – 2021-02-11 (×2): 1 mg via INTRAVENOUS
  Filled 2021-02-10 (×2): qty 1

## 2021-02-10 MED ORDER — MIDAZOLAM HCL 2 MG/2ML IJ SOLN
INTRAMUSCULAR | Status: DC | PRN
Start: 2021-02-10 — End: 2021-02-10
  Administered 2021-02-10 (×3): 1 mg via INTRAVENOUS

## 2021-02-10 MED ORDER — MIDAZOLAM HCL 2 MG/2ML IJ SOLN
INTRAMUSCULAR | Status: AC
  Filled 2021-02-10: qty 4

## 2021-02-10 MED ORDER — HEPARIN SODIUM (PORCINE) 1000 UNIT/ML IJ SOLN
INTRAMUSCULAR | Status: AC
  Filled 2021-02-10: qty 4

## 2021-02-10 MED ORDER — FENTANYL CITRATE PF 50 MCG/ML IJ SOSY
PREFILLED_SYRINGE | INTRAMUSCULAR | Status: AC
  Administered 2021-02-10: 12.5 ug via INTRAVENOUS
  Filled 2021-02-10: qty 1

## 2021-02-10 MED ORDER — ONDANSETRON HCL 4 MG/2ML IJ SOLN: 4.0000 mg | INTRAMUSCULAR | Status: DC | PRN

## 2021-02-10 MED ORDER — FENTANYL CITRATE (PF) 100 MCG/2ML IJ SOLN
INTRAMUSCULAR | Status: DC | PRN
  Administered 2021-02-10 (×2): 50 ug via INTRAVENOUS

## 2021-02-10 MED ORDER — FENTANYL CITRATE (PF) 100 MCG/2ML IJ SOLN
INTRAMUSCULAR | Status: AC
  Filled 2021-02-10: qty 4

## 2021-02-10 MED ORDER — GLUCAGON HCL RDNA (DIAGNOSTIC) 1 MG IJ SOLR
INTRAMUSCULAR | Status: AC
  Filled 2021-02-10: qty 1

## 2021-02-10 MED ORDER — FENTANYL CITRATE PF 50 MCG/ML IJ SOSY
12.5000 ug | PREFILLED_SYRINGE | INTRAMUSCULAR | Status: DC
Start: 2021-02-10 — End: 2021-02-10
  Administered 2021-02-10: 12.5 ug via INTRAVENOUS
  Filled 2021-02-10: qty 1

## 2021-02-10 MED ORDER — LIDOCAINE HCL 1 % IJ SOLN
INTRAMUSCULAR | Status: AC
  Administered 2021-02-10: 8 mL via INTRADERMAL
  Filled 2021-02-10: qty 20

## 2021-02-10 MED ORDER — ENOXAPARIN SODIUM 100 MG/ML IJ SOSY
100.0000 mg | PREFILLED_SYRINGE | INTRAMUSCULAR | Status: AC
Start: 2021-02-10 — End: 2021-02-10
  Administered 2021-02-10: 100 mg via SUBCUTANEOUS
  Filled 2021-02-10: qty 1

## 2021-02-10 MED ORDER — FENTANYL CITRATE PF 50 MCG/ML IJ SOSY
12.5000 ug | PREFILLED_SYRINGE | INTRAMUSCULAR | Status: DC | PRN
  Administered 2021-02-10 – 2021-02-11 (×2): 12.5 ug via INTRAVENOUS
  Filled 2021-02-10 (×2): qty 1

## 2021-02-10 MED ORDER — CEFAZOLIN SODIUM-DEXTROSE 2-4 GM/100ML-% IV SOLN
INTRAVENOUS | Status: AC
  Administered 2021-02-10: 2 g via INTRAVENOUS
  Filled 2021-02-10: qty 100

## 2021-02-10 NOTE — Progress Notes (Signed)
No CPT at this time pt refused. Pt in pain from procedure performed today. RT suctioned pt to remove secretions.

## 2021-02-10 NOTE — Progress Notes (Signed)
Hemodialysis- Patient completed 3.5 hours of treatment today. Still with a large amount of anxiety. Medication administered during treatment. 1.6L removed. Goal met. Reported off to [employer] on 2W.

## 2021-02-10 NOTE — Plan of Care (Signed)
  Problem: Activity: Goal: Risk for activity intolerance will decrease Outcome: Progressing   Problem: Nutrition: Goal: Adequate nutrition will be maintained Outcome: Progressing   Problem: Coping: Goal: Level of anxiety will decrease Outcome: Progressing   Problem: Pain Managment: Goal: General experience of comfort will improve Outcome: Progressing   

## 2021-02-10 NOTE — Progress Notes (Signed)
[facility] PROGRESS NOTE  Thomas [name] EZM:[id number] DOB: 1956/04/11 DOA: 11/30/2020 PCP: [name], NP  Status: Remains inpatient appropriate because:Persistent severe electrolyte disturbances, Unsafe d/c plan, and Inpatient level of care appropriate due to severity of illness  Dispo: The patient is from: Home              Anticipated d/c is to: SNF versus LTAC vs CIR              Patient currently is not medically stable to d/c.   Difficult to place patient Yes               Barriers to discharge: Currently has tracheostomy in place and is requiring dialysis but remains too weak to tolerate dialysis in chair which is a requirement for outpatient HD   Level of care: Progressive  Code Status: Full Family Communication: Attending physician spoke with wife at bedside 9/26 DVT prophylaxis: FD Lovenox COVID vaccination status: Moderna 07/08/2019 and 08/11/2019 with first booster dose given 08/05/2020   HPI:  65 y.o. male with a history of T2DM with neuropathy, HTN, HLD., colon cancer s/p resection and colostomy May 2022 s/p 3 cycles chemotherapy (last was July 2022). Followed by Dr. Benay Spice. Started CAPOX June 2022, reduced cepecitabine dose starting 11/19/2020 due to hand/foot syndrome, reduced oxaliplatin due to renal impairment and poor performance status. Presented to the ED 7/24 with abdominal pain found to be febrile, tachycardic, tachypneic. Lactic acid was 6.6 and persisted at 6.8 after sepsis IV fluid bolus and cefepime. He had ARF, demand ischemia, bandemia, was hyperglycemic with negative ketones, with lactic acidosis. He developed AFib with RVR in the ED, was started on diltiazem, and was admitted to the ICU for septic shock with unclear source. Sepsis was subsequently felt to be ruled out at time of admission, presentation felt more likely to be related to cepacitabine toxicity with oral mucositis, hand/foot syndrome, and diarrhea/high output colostomy.   He was transferred  to hospitalist service 7/27 but developed metabolic encephalopathy with hyoxia due to aspiration pneumonia on 7/30 requiring intubation and pressor support. Failed extubation with inability to clear secretions, requiring reintubation 8/8, and tracheostomy subsequently placed 8/11. This was pulled out 8/22, replaced under bronch guidance, again removed 8/25 requiring reintubation after failed reinsertion. ENT revised tracheostomy in the OR 8/29. He has required IHD for ARF, and also had complications of RLE DVT now on eliquis, colitis thought to be ischemic 8/2 not requiring specific intervention, Pseudomonas pneumonia having completed treatment, agitated encephalopathy which has improved, anemia requiring 4u PRBCs total, and thrombocytopenia which has resolved. On 9/4 he was transferred to hospitalist service having tolerated trach collar.  Patient was also evaluated by Dr. Benay Spice during this admission clarified that patient was receiving curative chemotherapy prior to admission  Subjective: Very sleepy this morning but is able to be awakened and have appropriate conversation.  He states he did not sleep at all last night.  Wife confirms this and stated that patient called her at 2 am to talk.  Objective: Vitals:   02/09/21 1636 02/09/21 2016  BP: 138/82 123/63  Pulse: 87 90  Resp: 15 18  Temp: 98.4 F (36.9 C) (!) 97.4 F (36.3 C)  SpO2: 100% 100%    Intake/Output Summary (Last 24 hours) at 02/10/2021 0803 Last data filed at 02/10/2021 0645 Gross per 24 hour  Intake 1164.94 ml  Output 400 ml  Net 764.94 ml   Filed Weights   02/08/21 0332 02/09/21 0500 02/10/21  0500  Weight: 97.9 kg 97.8 kg 98.3 kg    Exam: Constitutional: NAD, calm and very sleepy secondary to significant insomnia overnight Respiratory: #6 XLT uncuffed trach, FiO2 increased to 40 % trach collar, lung sounds remain clear but coarse, no increased work of breathing, pulse oximetry 99% Cardiovascular: Heart sounds S1-S2,  no peripheral edema, pulse remains regular, mildly hypertensive Abdomen: Abdomen soft and non distended/nontender.  Normoactive bowel sounds, stoma pink with thick liquid stool in bedside drainage bag-tube feeding off-PEG tube pending Genitourinary: Foley catheter has been removed.  No significant urinary output.  Asked staff to perform bladder scan at bedside and this was within normal limits at 162 cc Neurologic: CN 2-12 grossly intact. Sensation intact, Strength 3/5 x all 4 extremities.  Psychiatric: Sleepy and lethargic but awakens easily and is oriented x3.   Assessment/Plan: Acute problems:  Acute hypoxic respiratory failure s/p tracheostomy due to aspiration pneumonia  Recurrent pansensitive Pseudomonas tracheobronchitis -Tolerating PMV.  Trach team wants to make sure patient more mobile with stronger vocalization before attempt capping trials. -Continue Robinul  -SLP following for PMV training.  Grade LA C reflux esophagitis/nonbleeding duodenal ulcers -EGD 9/30 with the above findings -H. pylori pending -Patient needs to continue PPI for total of 4-weeks (initiated 9/29)-have transitioned Protonix and Pepcid to oral route -For now continue scheduled Zofran -Follow-up EGD recommended after discharge  Inability to elevate tongue  -Neurology has documented that it is very rare to have bilateral hypoglossal nerve injury in context of intubated/reintubation--neuro suspects related to mucositis, oral thrush, hand/foot syndrome -10/3 MBS demonstrated silent aspiration and significant difficulty with tongue and upper motility noting even the hyoid bone/surrounding muscular structures are not moving with attempts to swallow -PEG tube placement planned for 10/4 -It is hopeful that with treatment of severe mucositis as well as severe esophagitis that swallowing will improve -Of note all the motility issues are well above the trach insertion site so do not suspect trach contributing    Dysphagia/protein calorie malnutrition -Failed MBSS-SLP d/w wife PEG and she is agreeable- I discussed with pt and he is agreeable -Continue D5NS w/ KCL decrease rate to 50 cc/h until can resume TFs-1/3 K+ was 3.5  MRI with bilateral punctate lesion (Frontal watershed) -SLP evaluated and noted patient is unable to elevate tongue properly bilaterally-continues to have difficulty with swallowing and phonation -Imaging consistent with acute infarcts -Stroke team following-etiology of infarcts yet to be determined-differential includes thrombotic etiology from either A. fib or from DVT if has PFO -Continue Lovenox-currently on hold for PEG tube placement and will be resumed after procedure -statin currently on hold due to recent GI issues, Hgba1c 7/25 was 8.7; HDL cholesterol is low, LDL cholesterol at goal -Recommendation is follow-up MRI in 6 to 8 weeks with and without contrast since not clear as to whether this could also be metastatic in etiology  Acute pain -Decrease frequency of Fentanyl to q 4 hrs prn- continue same low dose of 12.5 mcg -Given lethargy this morning reviewed last dose of fentanyl and recent doses given  Urinary retention -Has completed Diflucan as of 9/28 -Foley catheter discontinued on 10/3 without evidence of recurrent urinary retention   Acute renal failure on stage IIIb CKD: Presumed ischemic ATN -started CRRT 7/31 > iHD 8/12 s/p left IJ [facility] 8/15.   - Continue HD TTS -Still unclear if will recover renal function   High output ostomy, diarrhea:  -Infectious work-up negative -Recent PSBO likely caused by Imodium and discontinued  Funguria/Thrush -Diflucan completed  9/27 -Continue nystatin   Anemia of critical illness, acute blood loss as well as anemia of chronic disease and iron deficiency:  - s/p 4u PRBCs this hospitalization  - IV iron TTS x8 doses per nephrology. -Recent hemoglobin stable at 10.6 with normal platelets   Stage IIIb colon CA s/p  partial transverse colectomy with end colostomy May 2022:  - Palliative care has been involved during this hospitalization- goals remain clear: full aggressive measures desired.   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   Uncontrolled T2DM on OHAs prior to admission with associated neuropathy:  -HbA1c 8.7% at admission. - Continue levemir and SSI  -Continue to hold gabapentin and gradually reintroduce medications   HTN:  -Norvasc held secondary to recent nausea and vomiting -BP has been relatively well controlled    HLD:  -Atorvastatin on hold due to nausea and vomiting   Thrombocytopenia:  -Transient likely due to critical illness. HIT Ab negative on 8/5.    ?Sepsis - initially thought to be present on admission- has been ruled out.    Pressure Injury 02/05/21 Coccyx Lower Stage 2 -  Partial thickness loss of dermis presenting as a shallow open injury with a red, pink wound bed without slough. healing quarter sized spot on sacrum (Active)  Date First Assessed/Time First Assessed: 02/05/21 0800   Location: Coccyx  Location Orientation: Lower  Staging: Stage 2 -  Partial thickness loss of dermis presenting as a shallow open injury with a red, pink wound bed without slough.  Wound Descript...    Assessments 02/05/2021  5:10 PM 02/08/2021  8:00 PM   Dressing Type Foam - Lift dressing to assess site every shift Foam - Lift dressing to assess site every shift  Dressing Changed Clean;Dry;Intact  State of Healing Early/partial granulation --  Site / Wound Assessment Pink --  % Wound base Red or Granulating 100% --  Wound Length (cm) 2 cm --  Wound Width (cm) 2 cm --  Wound Surface Area (cm^2) 4 cm^2 --  Margins Attached edges (approximated) --  Drainage Amount None --  Treatment Cleansed --     No Linked orders to display     Data Reviewed: Basic Metabolic Panel: Recent Labs  Lab 02/06/21 0434 02/07/21 0258 02/08/21 0500 02/09/21 0415 02/10/21 0407  NA 139 137 138 139 143  K 4.3 3.7 3.4* 3.5 3.5  CL 105 102 101 103 108  CO2 20* 19* _0 GLUCOSE 133* 134* 165* 165* 158*  BUN 72* 32* 28* 46* 58*  CREATININE 6.71* 4.22* 4.13* 5.47* 6.26*  CALCIUM 9.0 9.0 9.0 9.1 9.1  MG 1.9 1.8 1.8 1.9 1.9  PHOS 6.3* 2.3* 4.2 3.8 3.8   Liver Function Tests: Recent Labs  Lab 02/06/21 0434 02/07/21 0258 02/08/21 0500 02/09/21 0415 02/10/21 0407  ALBUMIN 2.6* 2.8* 2.9* 2.8* 2.7*    CBC: Recent Labs  Lab 02/07/21  8338 02/10/21 0407  WBC 18.4* 15.6*  HGB 8.7* 8.1*  HCT 26.4* 24.2*  MCV 85.4 84.9  PLT 336 323    CBG: Recent Labs  Lab 02/09/21 1202 02/09/21 1636 02/09/21 2012 02/10/21 0029 02/10/21 0341  GLUCAP 131* 110* 147* 143* 145*    Scheduled Meds:  chlorhexidine gluconate (MEDLINE KIT)  15 mL Mouth Rinse BID   Chlorhexidine Gluconate Cloth  6 each Topical Q0600   enoxaparin (LOVENOX) injection  100 mg Subcutaneous Q24H   feeding supplement (NEPRO CARB STEADY)  1,000 mL Per Tube Q24H   feeding supplement (PROSource TF)  45 mL Per Tube BID   hydrocerin   Topical BID   insulin aspart  0-15 Units Subcutaneous Q4H   insulin detemir  10 Units Subcutaneous Daily   mouth rinse  15 mL Mouth Rinse 10 times per day   nystatin  5 mL Mouth/Throat QID   ondansetron  4 mg Intravenous Q6H   pantoprazole (PROTONIX)  IV  40 mg Intravenous Q12H   scopolamine  1 patch Transdermal Q72H   sodium chloride HYPERTONIC  4 mL Nebulization BID   Continuous Infusions:  sodium chloride Stopped (12/20/20 0955)   anticoagulant sodium citrate      ceFAZolin (ANCEF) IV     dextrose 5 % and 0.9 % NaCl with KCl 20 mEq/L 75 mL/hr at 02/10/21 0329   dextrose 5 % and 0.9% NaCl 30 mL/hr at 02/06/21 1830   famotidine (PEPCID) IV     levETIRAcetam 500 mg (02/09/21 2236)   promethazine (PHENERGAN) injection (IM or IVPB) 12.5 mg (02/05/21 0416)    Principal Problem:   Diabetes mellitus with hyperosmolarity without hyperglycemic hyperosmolar nonketotic coma (HCC) Active Problems:   Lactic acidosis   Acute renal failure superimposed on stage 2 chronic kidney disease (HCC)   Metabolic acidosis   Tachycardia   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 (HCC)   Adverse effect of chemotherapy   Pressure injury of skin   Atrial fibrillation with RVR (HCC)   Sepsis (HCC)   Malnutrition of moderate degree   Acute metabolic encephalopathy   Acute respiratory failure with hypoxemia (HCC)   Acute renal failure with acute cortical necrosis (HCC)   Shock (HCC)   Status post tracheostomy ([place])   Endotracheal tube present   Hemodialysis status ([place])   Palliative care by specialist   SOB (shortness of breath)   Non-intractable vomiting   Abdominal pain, epigastric   Cerebral embolism with cerebral infarction   Acute esophagitis   Duodenal ulcer disease   Consultants: PCCM General surgery Nephrology Hematology/oncology Palliative care medicine team  Procedures: 2u PRBC 7/31, 1u PRBC 8/17, 1u PRBC 8/27 Tracheostomy revision 01/05/2021 Dr. Constance Holster [facility] 8/22    Antibiotics: Vancomycin 7/24 Cefepime 7/24 - 7/27 Unasyn 7/30 >> cefepime/flagyl 7/31 - 8/6     Time spent: 40 minutes    [name] ANP  [facility] 7 am - 330 pm/M-F for direct patient care and secure chat Please refer  to Amion for contact info 71  days

## 2021-02-10 NOTE — Procedures (Signed)
  Procedure: Percutaneous gastrostomy tube placement 20f EBL:   minimal Complications:  none immediate  See full dictation in [facility] PACS.  D. Woodruff Skirvin MD Main # [phone] Pager  [phone]    

## 2021-02-10 NOTE — Progress Notes (Signed)
Patient in HD with increased complaints of pain, page to Dr. Doristine Bosworth for notification. Return call from Somis, NP. New orders to [facility].  Dorthey Sawyer, RN  Dialysis Nurse Coordinator [phone]

## 2021-02-10 NOTE — Progress Notes (Signed)
RT at bedside for routine trach care. Pt is currently in dialysis. RT will assess when pt is back in room.

## 2021-02-10 NOTE — Progress Notes (Signed)
Nutrition Follow-up  DOCUMENTATION CODES:  Non-severe (moderate) malnutrition in context of chronic illness, Obesity unspecified  INTERVENTION:  Continue TF via PEG once ready for use: -Nepro 1.8 @ goal rate 78ml/hr (1344ml/d) -34ml Prosource TF BID   Provides 2456 kcals, 128 grams protein, 934ml free water   NUTRITION DIAGNOSIS:  Moderate Malnutrition related to chronic illness, cancer and cancer related treatments as evidenced by mild fat depletion, mild muscle depletion, percent weight loss. - ongoing  GOAL:  Patient will meet greater than or equal to 90% of their needs - will address with TF  MONITOR:  Diet advancement, Labs, Weight trends, TF tolerance, I & O's, Skin  REASON FOR ASSESSMENT:  Consult Enteral/tube feeding initiation and management  ASSESSMENT:  64 y.o. male who is a former with medical history of stage 3 colon cancer s/p partial colectomy and end colostomy on 5/4, recent chemo start with 3rd cycle on 7/22, sleep apnea, seizures, epilepsy, brain tumor, HTN, HLD, GERD, asthma, peripheral neuropathy, urinary frequency, DM, MCA stroke. He presented to the ED via EMS due to abdominal pain x2 weeks, anorexia, and weakness. Admitted for close monitoring for concern of mesenteric ischemia. Patient reported that abdominal pain was worse with eating PTA.  7/31 - CRRT initiated 8/09 - CRRT stopped  8/10 - transfer to [facility] for trach placement and iHD 8/11 - s/p trach 8/12 - Cortrak placed (tip of tube in stomach) 8/15 - s/p placement of [facility] 8/22 - pulled trach out, replaced under bronch guidance, s/p bronch revealing mild area of granulation tissue of top of trach tube 8/25 - pulled trach out, transferred to ICU, emergently intubated 8/29 - trach replaced with ENT in OR 9/04 - tx out of ICU 9/09 - changed to cuffless trach 9/22 - diet advanced to clears 9/23 - made NPO  9/27 - TF held 2/2 emesis 9/30 - s/p EGD which revealed grade LA C reflux esophagitis/non-bleeding  duodenal ulcers 10/03 - Cortrak removed; pt failed MBSS  Pt remains on trach collar and requiring iHD but remains too weak to tolerate dialysis in chair which is a requirement for outpatient HD. Discussed pt with NP and SLP as MBS yesterday revealed silent aspiration and significant difficulty with tongue and upper motility noting even the hyoid bone/surrounding muscular structures not moving with attempts to swallow. Cortrak was removed yesterday prior to [facility] and pt refused to have it replaced after failed swallow study. Pt having PEG placed today. SLP/NP hopeful swallowing will improve with treatment of severe mucositis and severe esophagitis.    Current TF orders: Nepro 1.8 @ 34ml/hr (1348ml/d) w/ 66ml Prosource TF BID  Pt to have HD again today. Last HD 10/01 w/ 532mL net UF.   Per RN edema assessment, non-pitting edema noted to BUE and BLE.   Admit wt: 111.8 kg Current wt: 98.3 kg  Medications: Glucagen, SSI, Levemir, Zofran, Protonix, IV Pepcid, IV Phenergan IVF: D5-NS @ 6ml/hr Labs: BUN 58 (H), Cr 6.26 (H) CBGs 110-153 x24 hours  UOP: 126ml x24 hours Colostomy: 212ml x24 hours  Diet Order:   Diet Order     None      EDUCATION NEEDS:  No education needs have been identified at this time  Skin:  Skin Assessment: Skin Integrity Issues: Skin Integrity Issues:: Stage II Stage II: coccyx  Last BM:  10/03  Height:  Ht Readings from Last 1 Encounters:  01/01/21 5\' 8"  (1.727 m)   Weight:  Wt Readings from Last 1 Encounters:  02/10/21 98.3 kg  Ideal Body Weight:  70 kg  BMI:  Body mass index is 32.95 kg/m.  Estimated Nutritional Needs:  Kcal:  2300-9794 Protein:  120-140 gm Fluid:  1 L + UOP    Larkin Ina, MS, RD, LDN (she/her/hers) RD pager number and weekend/on-call pager number located in [place].

## 2021-02-10 NOTE — Progress Notes (Signed)
Occupational Therapy Treatment Patient Details Name: Thomas [name] MRN: [id number] DOB: 1955/07/24 Today's Date: 02/10/2021   History of present illness 65 y.o. male admitted 7/24 with high ostomy output. Pt with severe sepsis and multisystem organ failure. Hospital admission complicated by volume overload, aspiration pneumonia, acute respiratory failure, AKI and ischemic colitis. Intubated 7/30. S/p trach placement 8/11. CorTrak 8/12. HD initiated 8/12. To IR 8/15 for tunneled HD cath. Pulled out trach x2 8/22 replaced by PCCM. Pulled out trach again 8/25 but unable to be reinserted with pt intubated. 8/29 to OR for stoma clean up and trach replacement.Trach downsized 9/10.  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   OT comments  Nursing states that patient is scheduled for PEG tube placement and HD today and patient appears anxious. Patient agreed to get to eob to address grooming and breathing exercises.  Patient was min assist to get to eob and was able to perform grooming with supervision. Patient declined standing and addressed breathing techniques to address anxiety. Patient returned to bed with min assist. Acute OT to continue to follow.    Recommendations for follow up therapy are one component of a multi-disciplinary discharge planning process, led by the attending physician.  Recommendations may be updated based on patient status, additional functional criteria and insurance authorization.    Follow Up Recommendations  SNF;Supervision/Assistance - 24 hour    Equipment Recommendations  Other (comment)    Recommendations for Other Services      Precautions / Restrictions Precautions Precautions: Fall Precaution Comments: cortrak, R subclavian tunneled HD cath, ostomy, O2 via  trach collar, N&V       Mobility Bed Mobility Overal bed mobility: Needs Assistance Bed Mobility: Supine to Sit;Sit to Supine     Supine to sit: Min assist Sit to supine: Min assist   General bed mobility comments: required assistance with trunk for supine to sit and assistance with BLEs for sit to supine    Transfers                      Balance Overall balance assessment: Needs assistance Sitting-balance support: No upper extremity supported;Bilateral upper extremity supported;Feet supported Sitting balance-Leahy Scale: Good Sitting balance - Comments: performed grooming seated on eob                                   ADL either performed or assessed with clinical judgement   ADL Overall ADL's : Needs assistance/impaired     Grooming: Wash/dry hands;Wash/dry face;Supervision/safety;Sitting Grooming Details (indicate cue type and reason): performed sitting on eob                               General ADL Comments: limited activity on this date due to anxiety     Vision       Perception     Praxis      Cognition Arousal/Alertness: Awake/alert Behavior During Therapy: Flat affect Overall Cognitive Status: Impaired/Different from baseline Area of Impairment: Safety/judgement;Awareness;Problem solving;Following commands;Attention                   Current Attention Level: Selective   Following Commands: Follows one step commands inconsistently;Follows one step  commands with increased time Safety/Judgement: Decreased awareness of safety;Decreased awareness of deficits Awareness: Intellectual Problem Solving: Slow processing;Requires verbal cues;Requires tactile cues General Comments: Patient was anxious due to scheduled surgery and HD        Exercises     Shoulder Instructions       General Comments      Pertinent Vitals/ Pain       Pain Assessment: Faces Faces Pain Scale: Hurts little more Pain Location:  abdomen Pain Descriptors / Indicators: Grimacing;Guarding;Restless Pain Intervention(s): Limited activity within patient's tolerance  Home Living                                          Prior Functioning/Environment              Frequency  Min 2X/week        Progress Toward Goals  OT Goals(current goals can now be found in the care plan section)  Progress towards OT goals: Progressing toward goals  Acute Rehab OT Goals Patient Stated Goal: get home OT Goal Formulation: With patient Time For Goal Achievement: 02/19/21 Potential to Achieve Goals: Good ADL Goals Pt Will Perform Grooming: with min guard assist;standing Pt Will Perform Upper Body Dressing: with min assist;sitting Pt Will Perform Lower Body Dressing: with mod assist;sit to/from stand Pt Will Transfer to Toilet: with supervision;ambulating;bedside commode Pt Will Perform Toileting - Clothing Manipulation and hygiene: with supervision;sit to/from stand Pt Will Perform Tub/Shower Transfer: Tub transfer;Shower transfer;3 in Energy manager will Perform Home Exercise Program: Increased ROM;Increased strength;Right Upper extremity;Left upper extremity;With written HEP provided;Independently Additional ADL Goal #1: Pt will demonstrate ability to perform sustained acitivity up to 10 min standing sink side with Min guard and less than two rest breaks to increase independence with ADL. Additional ADL Goal #2: [place] will complete sit to stand transfers with Mod A +2 in prep for ADLs. Additional ADL Goal #3: Patient will attend to 2/3 grooming tasks without cues for continuation indicating increased attention to task.  Plan Discharge plan remains appropriate    Co-evaluation                 AM-PAC OT "6 Clicks" Daily Activity     Outcome Measure   Help from another person eating meals?: Total Help from another person taking care of personal grooming?: A Little Help from  another person toileting, which includes using toliet, bedpan, or urinal?: A Lot Help from another person bathing (including washing, rinsing, drying)?: A Lot Help from another person to put on and taking off regular upper body clothing?: A Lot Help from another person to put on and taking off regular lower body clothing?: A Lot 6 Click Score: 12    End of Session    OT Visit Diagnosis: Unsteadiness on feet (R26.81);Muscle weakness (generalized) (M62.81);Pain Pain - Right/Left: Left Pain - part of body:  (abdomen)   Activity Tolerance Other (comment) (patient was anxious)   Patient Left in bed;with call bell/phone within reach;with bed alarm set;with nursing/sitter in room   Nurse Communication Other (comment) (discussed anxiety)        Time: 5397-6734 OT Time Calculation (min): 16 min  Charges: OT General Charges $OT Visit: 1 Visit OT Treatments $Self Care/Home Management : 8-22 mins  Lodema Hong, OTA   Kannan Proia Alexis Goodell 02/10/2021, 7:57 AM

## 2021-02-10 NOTE — Progress Notes (Signed)
[facility] Progress Note   65 year old gentleman with a history of colon cancer status post resection and colostomy May 2022.  Status post 3 cycles of chemotherapy last was in July 2022.  Type 2 diabetes with neuropathy hypertension hyperlipidemia presented to the emergency room 11/30/2020 with abdominal pain.  Was found to be secondary to sepsis.  Patient also was found to have acute kidney injury.  Developed atrial fibrillation with rapid ventricular rate and was admitted to intensive care unit for septic shock.  On 12/03/2020 he developed metabolic encephalopathy with hypoxia and aspiration pneumonia and required intubation 12/06/2020.  He also required pressor support.  He was reintubated since 12/15/2020 tracheostomy placed.  His hospital course  complicated by DVT.  It appears that he has recurrent Pseudomonas pneumonia.  He also has recurrent anemia requiring transfusion of 4 units of packed red blood cells.  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 CRRT initiated 7/31 and transitioned to IHD on 12/19/20.  S/p LIJ TDC placement 12/22/20.  Monitoring for signs of recovery but urine output has been fairly minimal and he remains dialysis dependent on TTS schedule currently - HD today.   2.Urinary retention   Foley catheter removed 10/3, following bladder scans. Has condom cath on now.    3.Volume overload/anasarca-improved with dialysis.   4.Sepsis/aspiration pneumonia/VDRF:aspiration pneumonia.  s/p trach   4.Stage IIIB colon cancer - sp partial colectomy, colostomy, completed chemoRx sp 3 cycles in 11/2020 - per notes planned for 4 cycles, 3rd cycle limited by toxicities/status.    5.Right leg DVT: On Eliquis.   6.Atrial fibrillation - on amiodarone and anticoagulation.     7.Anemia of critical illness:  transfuse as needed, holding erythropoietin with malignancy.  Underwent EGD on  9/30.   8. CKD-MBD; started sevelamer for hyperphosphatemia on 9/8, monitor phosphorus level.Currently < 5.5.   9. CVA, incidental:  neuro performed w/u and has signed off - f/u in clinic planned.    9. Disposition -  Seen by palliative care team.  Discussion ongoing for possible transfer to [facility].  Having G tube placed.   Subjective:   Patient feels okay today.  Plans for G tube placement UOP 124m yesterday.   Objective:   BP 123/63 (BP Location: Left Arm)   Pulse 90   Temp (!) 97.4 F (36.3 C) (Axillary)   Resp 18   Ht 5' 8"  (1.727 m)   Wt 97.8 kg   SpO2 100%   BMI 32.78 kg/m   Intake/Output Summary (Last 24 hours) at 02/10/2021 02202Last data filed at 02/09/2021 1800 Gross per 24 hour  Intake 50 ml  Output 400 ml  Net -350 ml    Weight change:   Physical Exam: General: Chronically ill-appearing, lying in bed, no distress  Heart: Normal rate Lungs: normal wob, bilateral chest rise, rhonchi without rales Abdomen:soft, Non-tender, non-distended Extremities: Minimal edema, warm and well perfused Neurology: Alert awake and following commands  Dialysis Access: LIJ TDC in place.   R chest wall port GU: foley draining cloudy urine, bladder nontender with palpation    Imaging: DG Swallowing Func-Speech Pathology  Result Date: 02/09/2021 Table formatting from the original result was not included. 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  Dizziness   Dyspnea   pt states d/t weight  Epilepsy ([place])   takes Keppra daily  GERD (gastroesophageal reflux disease)   takes Omeprazole daily  Headache   Hyperlipidemia   takes Atorvastatin daily  Hypertension   takes Lotrel daily  Peripheral edema   takes Lasix daily  Peripheral neuropathy   takes Gabapentin daily  Pneumonia 3 yrs ago  hx of  Seizures ([place])   Sleep apnea   Urinary frequency  Past Surgical History: Past Surgical History: Procedure Laterality Date  BIOPSY  09/05/2020  Procedure: BIOPSY;  Surgeon: Irene Shipper, MD;  Location: [facility] ENDOSCOPY;  Service: Endoscopy;;  BIOPSY  02/06/2021  Procedure: BIOPSY;  Surgeon: Jerene Bears, MD;  Location: [facility] ENDOSCOPY;  Service: Gastroenterology;;  CARDIAC CATHETERIZATION  7 yrs ago  COLONOSCOPY WITH PROPOFOL N/A 09/05/2020  Procedure: COLONOSCOPY WITH PROPOFOL;  Surgeon: Irene Shipper, MD;  Location: [facility];  Service: Endoscopy;  Laterality: N/A;  CRANIOTOMY N/A 09/01/2016  Procedure: CRANIOTOMY TUMOR  LEFT PTERIONAL;  Surgeon: Ashok Pall, MD;  Location: [facility];  Service: Neurosurgery;  Laterality: N/A;  cyst removed from chest     as a child  ESOPHAGOGASTRODUODENOSCOPY (EGD) WITH PROPOFOL N/A 02/06/2021  Procedure: ESOPHAGOGASTRODUODENOSCOPY (EGD) WITH PROPOFOL;  Surgeon: Jerene Bears, MD;  Location: [facility] ENDOSCOPY;  Service: Gastroenterology;  Laterality: N/A;  IR FLUORO GUIDE CV LINE LEFT  12/22/2020  IR US GUIDE VASC ACCESS LEFT  12/22/2020  NO PAST SURGERIES    PARTIAL COLECTOMY N/A 09/10/2020  Procedure: TRANSVERSE COLECTOMY;  Surgeon: [name], MD;  Location: [facility];  Service: General;  Laterality: N/A;  POLYPECTOMY  09/05/2020  Procedure: POLYPECTOMY;  Surgeon: Irene Shipper, MD;  Location: [facility];  Service: Endoscopy;;  PORTACATH PLACEMENT Right 10/01/2020  Procedure: INSERTION PORT-A-CATH;  Surgeon: [name], MD;  Location: [place];  Service: General;   Laterality: Right;  SUBMUCOSAL TATTOO INJECTION  09/05/2020  Procedure: SUBMUCOSAL TATTOO INJECTION;  Surgeon: Irene Shipper, MD;  Location: [place];  Service: Endoscopy;;  TRACHEOSTOMY TUBE PLACEMENT N/A 01/05/2021  Procedure: TRACHEOSTOMY;  Surgeon: [name], MD;  Location: [facility];  Service: ENT;  Laterality: N/A; HPI: Pt is a 65 y/o male admitted on 7/25 for worsening sepsis vs reaction to chemotherapy with high ostomy output.  Pt developed worsening hypoxia and was requiring BIPAP w/ FIO2 of 100% and increased work of breathing. SLP consulted due to concern for aspiration. BSE 7/28 recommended NPO status and MBS, but this could not completed subsequently due to lethargy. CXR in AM of 7/30 showed some worsening concern of aspiration. ETT 7/30-8/8; tolerated nasal cannula for about an hour; reintubated 8/8-trach 8/11. Pt pulled out trach x2 8/22; replaced by PCCM. Bronch on 8/22 revealed endotracheal granulation tissue at trach site. Trach removed again on 8/25; unable to reinsert despite multiple attenpts; pt reintubated 8/25- trach by ENT on 8/29. BSE 8/22 limited to ice chips due to pt's refusal of other consistencies; rx NPO status with Cortrak.EGD 9/30:grade C reflux esophagitis/non-bleeding duodenal ulcers.  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  Subjective: alert, cortrak removed Assessment / Plan / Recommendation CHL IP CLINICAL IMPRESSIONS 02/09/2021 Clinical Impression   Pt continues to present with a severe oral and pharyngeal dysphagia with improved visibility of the pharyngeal phase during today's study.  There was limited mobility of the tongue, with some elevation of the superior surface, but poor tongue tip elevation and base of  tongue retraction. The chair was moved to a reclined position in an effort to allow gravity to assist with bolus transfer.  Five and ten ml boluses of nectar and thin liquid were administered from a syringe into mid-oral cavity.  Pt demonstrated effort to manipulate material; however, liquids generally transitioned passively into pharynx.  Repetitive attempts to swallow were observed - there was severe impairment in the mobility of the larynx, limited pharyngeal squeeze, and incomplete epiglottic inversion over the larynx.  Liquids filled the valleculae and pyriforms and spilled into the airway before, during, and after efforts to swallow.  Aspiration did not generally elicit a cough response.  Study was discontinued.  MBS was observed in real time by pt's wife and daughter.  We discussed results.  Results were also reviewed with [name].  Etiology of such severe dysphagia is unclear.  Neurological findings would not generally lead to such severe impairment. Pt's voice is improving (PMV in use during study), and his deficits are not consistent with the type of laryngeal trauma associated with oral intubation. There is potential for the inflammation seen on EGD and degree of thrush on tongue to also impact pharynx/larynx (not visible via MBS and not noted per EGD) and interfere with function. For now, recommend continuing NPO except ice chips. Mr. and Mrs. Clemence have been amenable to discussing PEG.  SLP will follow for therapeutic exercises if he is able and will explore potential for free water protocol. SLP Visit Diagnosis Dysphagia, oropharyngeal phase (R13.12) Attention and concentration deficit following -- Frontal lobe and executive function deficit following -- Impact on safety and function Severe aspiration risk;Risk for inadequate nutrition/hydration   CHL IP TREATMENT RECOMMENDATION 02/09/2021 Treatment Recommendations Therapy as outlined in treatment plan below   Prognosis 02/09/2021 Prognosis for  Safe Diet Advancement Fair Barriers to Reach Goals Time post onset;Severity of deficits Barriers/Prognosis Comment -- CHL IP DIET RECOMMENDATION 02/09/2021 SLP Diet Recommendations Other (Comment) Liquid Administration via -- Medication Administration Via alternative means Compensations -- Postural Changes --   CHL IP OTHER RECOMMENDATIONS 02/09/2021 Recommended Consults -- Oral Care Recommendations Oral care QID Other Recommendations --   CH[facility] IP FOLLOW UP RECOMMENDATIONS 02/09/2021 Follow up Recommendations Skilled Nursing facility   [facility] IP FREQUENCY AND DURATION 02/09/2021 Speech Therapy Frequency (ACUTE ONLY) min 2x/week Treatment Duration 2 weeks      CHL IP ORAL PHASE 02/09/2021 Oral Phase Impaired Oral - Pudding Teaspoon -- Oral - Pudding Cup -- Oral - Honey Teaspoon -- Oral - Honey Cup -- Oral - Nectar Teaspoon Weak lingual manipulation;Incomplete tongue to palate contact;Reduced posterior propulsion;Right pocketing in lateral sulci;Left pocketing in lateral sulci;Pocketing in anterior sulcus;Lingual/palatal residue;Delayed oral transit;Decreased bolus cohesion Oral - Nectar Cup -- Oral - Nectar Straw -- Oral - Thin Teaspoon Weak lingual manipulation;Incomplete tongue to palate contact;Reduced posterior propulsion;Right pocketing in lateral sulci;Left pocketing in lateral sulci;Pocketing in anterior sulcus;Lingual/palatal residue;Delayed oral  transit;Decreased bolus cohesion Oral - Thin Cup -- Oral - Thin Straw -- Oral - Puree NT Oral - Mech Soft -- Oral - Regular NT Oral - Multi-Consistency -- Oral - Pill -- Oral Phase - Comment --  CHL IP PHARYNGEAL PHASE 02/09/2021 Pharyngeal Phase Impaired Pharyngeal- Pudding Teaspoon -- Pharyngeal -- Pharyngeal- Pudding Cup -- Pharyngeal -- Pharyngeal- Honey Teaspoon -- Pharyngeal -- Pharyngeal- Honey Cup -- Pharyngeal -- Pharyngeal- Nectar Teaspoon Delayed swallow initiation-pyriform sinuses;Reduced pharyngeal peristalsis;Reduced epiglottic inversion;Reduced anterior  laryngeal mobility;Reduced laryngeal elevation;Reduced airway/laryngeal closure;Reduced tongue base retraction;Penetration/Aspiration before swallow;Penetration/Aspiration during swallow;Penetration/Apiration after swallow;Moderate aspiration;Pharyngeal residue - pyriform;Pharyngeal residue - valleculae Pharyngeal -- Pharyngeal- Nectar Cup -- Pharyngeal -- Pharyngeal- Nectar Straw -- Pharyngeal -- Pharyngeal- Thin Teaspoon Delayed swallow initiation-pyriform sinuses;Reduced pharyngeal peristalsis;Reduced epiglottic inversion;Reduced anterior laryngeal mobility;Reduced laryngeal elevation;Reduced airway/laryngeal closure;Reduced tongue base retraction;Penetration/Aspiration before swallow;Penetration/Aspiration during swallow;Penetration/Apiration after swallow;Moderate aspiration;Pharyngeal residue - pyriform;Pharyngeal residue - valleculae Pharyngeal Material enters airway, passes BELOW cords without attempt by patient to eject out (silent aspiration);Material enters airway, passes BELOW cords and not ejected out despite cough attempt by patient Pharyngeal- Thin Cup NT Pharyngeal -- Pharyngeal- Thin Straw -- Pharyngeal -- Pharyngeal- Puree NT Pharyngeal -- Pharyngeal- Mechanical Soft -- Pharyngeal -- Pharyngeal- Regular NT Pharyngeal -- Pharyngeal- Multi-consistency -- Pharyngeal -- Pharyngeal- Pill -- Pharyngeal -- Pharyngeal Comment --  CHL IP CERVICAL ESOPHAGEAL PHASE 01/19/2021 Cervical Esophageal Phase (No Data) Pudding Teaspoon -- Pudding Cup -- Honey Teaspoon -- Honey Cup -- Nectar Teaspoon -- Nectar Cup -- Nectar Straw -- Thin Teaspoon -- Thin Cup -- Thin Straw -- Puree -- Mechanical Soft -- Regular -- Multi-consistency -- Pill -- Cervical Esophageal Comment -- Thomas [name] 02/09/2021, 3:35 PM              VAS [place] TRANSCRANIAL DOPPLER W BUBBLES  Result Date: 02/09/2021  Transcranial Doppler with Bubble Patient Name:  Thomas [name]  Date of Exam:   02/08/2021 Medical Rec #: [id number]     Accession  #:    [id number] Date of Birth: June 21, 1955     Patient Gender: M Patient Age:   63 years Exam Location[facility]  [facility] Procedure:      VAS [place] TRANSCRANIAL DOPPLER W BUBBLES Referring Phys: PRAMOD SETHI --------------------------------------------------------------------------------  Indications: Stroke. History: DVT found 12/12/20. Comparison Study: No prior study Performing Technologist: Sharion Dove RVS  Examination Guidelines: A complete evaluation includes B-mode imaging, spectral Doppler, color Doppler, and power Doppler as needed of all accessible portions of each vessel. Bilateral testing is considered an integral part of a complete examination. Limited examinations for reoccurring indications may be performed as noted.  Summary: No HITS at rest or during Valsalva. Negative transcranial Doppler Bubble study with no evidence of right to left intracardiac communication.  A vascular evaluation was performed. The left middle cerebral artery was studied. An IV was inserted into the patient's right central line. Verbal informed consent was obtained.  NEGATIVE TCD BUbble study *See table(s) above for TCD measurements and observations.  Diagnosing physician: Antony Contras MD Electronically signed by Antony Contras MD on 02/09/2021 at 8:24:18 AM.    Final    VAS US CAROTID  Result Date: 02/09/2021 Carotid Arterial Duplex Study Patient Name:  Thomas [name]  Date of Exam:   02/08/2021 Medical Rec #: [id number]     Accession #:    [id number] Date of Birth: July 25, 1955     Patient Gender: M Patient Age:   36 years Exam Location:  [facility] Procedure:  VAS US CAROTID Referring Phys: PRAMOD SETHI --------------------------------------------------------------------------------  Indications:       CVA. Risk Factors:      Hypertension, hyperlipidemia, Diabetes, past history of                    smoking. Other Factors:     Atrial fibrillation, DVT, on Eliquis. Obstructive sleep                    apnea.  Limitations        Today's exam was limited due to trach collar, central line,                    dialysis catheter, coughing. Comparison Study:  No prior study on file Performing Technologist: Sharion Dove RVS  Examination Guidelines: A complete evaluation includes B-mode imaging, spectral Doppler, color Doppler, and power Doppler as needed of all accessible portions of each vessel. Bilateral testing is considered an integral part of a complete examination. Limited examinations for reoccurring indications may be performed as noted.  Right Carotid Findings: +----------+--------+--------+--------+------------------+------------------+           PSV cm/sEDV cm/sStenosisPlaque DescriptionComments           +----------+--------+--------+--------+------------------+------------------+ CCA Prox  65      20                                intimal thickening +----------+--------+--------+--------+------------------+------------------+ CCA Distal49      19                                intimal thickening +----------+--------+--------+--------+------------------+------------------+ ICA Prox  61      27                                                   +----------+--------+--------+--------+------------------+------------------+ ICA Distal88      43                                                   +----------+--------+--------+--------+------------------+------------------+ ECA       66      11                                                   +----------+--------+--------+--------+------------------+------------------+ +----------+--------+-------+------------+-------------------+           PSV cm/sEDV cmsDescribe    Arm Pressure (mmHG) +----------+--------+-------+------------+-------------------+ Subclavian               Not assessed                    +----------+--------+-------+------------+-------------------+ +---------+--------+--------+------------+  VertebralPSV cm/sEDV cm/sNot assessed +---------+--------+--------+------------+  Left Carotid Findings: +----------+--------+--------+--------+------------------+------------------+           PSV cm/sEDV cm/sStenosisPlaque DescriptionComments           +----------+--------+--------+--------+------------------+------------------+ CCA Prox  58      21  intimal thickening +----------+--------+--------+--------+------------------+------------------+ CCA Distal75      21                                intimal thickening +----------+--------+--------+--------+------------------+------------------+ ICA Prox  73      22              heterogenous                         +----------+--------+--------+--------+------------------+------------------+ ICA Distal73      30                                                   +----------+--------+--------+--------+------------------+------------------+ ECA       14                                                           +----------+--------+--------+--------+------------------+------------------+ +----------+--------+--------+------------+-------------------+           PSV cm/sEDV cm/sDescribe    Arm Pressure (mmHG) +----------+--------+--------+------------+-------------------+ Subclavian                Not assessed                    +----------+--------+--------+------------+-------------------+ +---------+--------+--+--------+--+---------+ VertebralPSV cm/s63EDV cm/s22Antegrade +---------+--------+--+--------+--+---------+   Summary: Right Carotid: The extracranial vessels were near-normal with only minimal wall                thickening or plaque. Left Carotid: The extracranial vessels were near-normal with only minimal wall               thickening or plaque. Vertebrals:  Left vertebral artery demonstrates antegrade flow. Right vertebral              artery was not visualized.  Subclavians: Bilateral subclavian arteries were not visualized. *See table(s) above for measurements and observations.  Electronically signed by Antony Contras MD on 02/09/2021 at 8:20:41 AM.    Final    VAS [place] TRANSCRANIAL DOPPLER  Result Date: 02/09/2021  Transcranial Doppler Patient Name:  Thomas [name]  Date of Exam:   02/08/2021 Medical Rec #: [id number]     Accession #:    [id number] Date of Birth: 04/04/1956     Patient Gender: M Patient Age:   56 years Exam Location:  [facility] Procedure:      VAS [place] TRANSCRANIAL DOPPLER Referring Phys: PRAMOD SETHI --------------------------------------------------------------------------------  Indications: Stroke. History: DVT found 8/22. Limitations: trach Comparison Study: No prior study Performing Technologist: Sharion Dove RVS  Examination Guidelines: A complete evaluation includes B-mode imaging, spectral Doppler, color Doppler, and power Doppler as needed of all accessible portions of each vessel. Bilateral testing is considered an integral part of a complete examination. Limited examinations for reoccurring indications may be performed as noted.  +----------+-------------+----------+-----------+------------------+ RIGHT TCD Right VM (cm)Depth (cm)Pulsatility     Comment       +----------+-------------+----------+-----------+------------------+ MCA           81.00                 0.92                       +----------+-------------+----------+-----------+------------------+  ACA                                         unable to insonate +----------+-------------+----------+-----------+------------------+ Term ICA      55.00                 1.08                       +----------+-------------+----------+-----------+------------------+ PCA           21.00                 0.83                       +----------+-------------+----------+-----------+------------------+ Opthalmic     17.00                 1.57                        +----------+-------------+----------+-----------+------------------+ ICA siphon                                  unable to insonate +----------+-------------+----------+-----------+------------------+ Vertebral                                         trach        +----------+-------------+----------+-----------+------------------+ Distal ICA                                        trach        +----------+-------------+----------+-----------+------------------+  +----------+------------+----------+-----------+-------+ LEFT TCD  Left VM (cm)Depth (cm)PulsatilityComment +----------+------------+----------+-----------+-------+ MCA          95.00                 1.17            +----------+------------+----------+-----------+-------+ ACA          -35.00                1.17            +----------+------------+----------+-----------+-------+ Term ICA     101.00                1.18            +----------+------------+----------+-----------+-------+ PCA          13.00                 0.94            +----------+------------+----------+-----------+-------+ Opthalmic    34.00                  1.5            +----------+------------+----------+-----------+-------+ ICA siphon   41.00                 0.81            +----------+------------+----------+-----------+-------+ Vertebral                                   trach  +----------+------------+----------+-----------+-------+  Distal ICA                                  trach  +----------+------------+----------+-----------+-------+  +------------+-----+-------+             VM cmComment +------------+-----+-------+ Prox Basilar      trach  +------------+-----+-------+ Summary:  Poor suboccipital window and poor temporal windows limit evaluation.Mildly elevated mean flow velocities in terminal left internal carotid, and bilateral middle cerebral arteries indicates mild stenosis in these vessels.  *See table(s) above for TCD measurements and observations.  Diagnosing physician: Antony Contras MD Electronically signed by Antony Contras MD on 02/09/2021 at 8:23:43 AM.    Final     Labs: BMET Recent Labs  Lab 02/05/21 0244 02/05/21 2159 02/06/21 0434 02/07/21 0258 02/08/21 0500 02/09/21 0415 02/10/21 0407  NA 135 136 139 137 138 139 143  K 5.2* 4.2 4.3 3.7 3.4* 3.5 3.5  CL 98 103 105 102 101 103 108  CO2 21* 19* 20* 19* 24 22 23   GLUCOSE 168* 185* 133* 134* 165* 165* 158*  BUN 62* 72* 72* 32* 28* 46* 58*  CREATININE 6.42* 6.84* 6.71* 4.22* 4.13* 5.47* 6.26*  CALCIUM 9.5 9.0 9.0 9.0 9.0 9.1 9.1  PHOS 5.2* 5.8* 6.3* 2.3* 4.2 3.8 3.8    CBC Recent Labs  Lab 02/03/21 0647 02/07/21 0952 02/10/21 0407  WBC 18.7* 18.4* 15.6*  HGB 8.5* 8.7* 8.1*  HCT 25.4* 26.4* 24.2*  MCV 85.5 85.4 84.9  PLT 381 336 323     Medications:     chlorhexidine gluconate (MEDLINE KIT)  15 mL Mouth Rinse BID   Chlorhexidine Gluconate Cloth  6 each Topical Q0600   enoxaparin (LOVENOX) injection  100 mg Subcutaneous Q24H   feeding supplement (NEPRO CARB STEADY)  1,000 mL Per Tube Q24H   feeding supplement (PROSource TF)  45 mL Per Tube BID   hydrocerin   Topical BID   insulin aspart  0-15 Units Subcutaneous Q4H   insulin detemir  10 Units Subcutaneous Daily   mouth rinse  15 mL Mouth Rinse 10 times per day   nystatin  5 mL Mouth/Throat QID   ondansetron  4 mg Intravenous Q6H   pantoprazole (PROTONIX) IV  40 mg Intravenous Q12H   scopolamine  1 patch Transdermal Q72H   sodium chloride HYPERTONIC  4 mL Nebulization BID     Justin Mend  02/10/2021, 6:07 AM

## 2021-02-11 ENCOUNTER — Inpatient Hospital Stay (HOSPITAL_COMMUNITY): Payer: Medicare HMO

## 2021-02-11 DIAGNOSIS — E1100 Type 2 diabetes mellitus with hyperosmolarity without nonketotic hyperglycemic-hyperosmolar coma (NKHHC): Secondary | ICD-10-CM | POA: Diagnosis not present

## 2021-02-11 LAB — PROCALCITONIN: Procalcitonin: 4.24 ng/mL

## 2021-02-11 LAB — CBC WITH DIFFERENTIAL/PLATELET
Abs Immature Granulocytes: 0.2 10*3/uL — ABNORMAL HIGH (ref 0.00–0.07)
Basophils Absolute: 0.1 10*3/uL (ref 0.0–0.1)
Basophils Relative: 0 %
Eosinophils Absolute: 0.1 10*3/uL (ref 0.0–0.5)
Eosinophils Relative: 0 %
HCT: 27.3 % — ABNORMAL LOW (ref 39.0–52.0)
Hemoglobin: 8.8 g/dL — ABNORMAL LOW (ref 13.0–17.0)
Immature Granulocytes: 1 %
Lymphocytes Relative: 14 %
Lymphs Abs: 3.1 10*3/uL (ref 0.7–4.0)
MCH: 27.8 pg (ref 26.0–34.0)
MCHC: 32.2 g/dL (ref 30.0–36.0)
MCV: 86.4 fL (ref 80.0–100.0)
Monocytes Absolute: 2.1 10*3/uL — ABNORMAL HIGH (ref 0.1–1.0)
Monocytes Relative: 10 %
Neutro Abs: 16.1 10*3/uL — ABNORMAL HIGH (ref 1.7–7.7)
Neutrophils Relative %: 75 %
Platelets: 286 10*3/uL (ref 150–400)
RBC: 3.16 MIL/uL — ABNORMAL LOW (ref 4.22–5.81)
RDW: 15.3 % (ref 11.5–15.5)
WBC: 21.7 10*3/uL — ABNORMAL HIGH (ref 4.0–10.5)
nRBC: 0 % (ref 0.0–0.2)

## 2021-02-11 LAB — BLOOD GAS, ARTERIAL
Acid-base deficit: 0.2 mmol/L (ref 0.0–2.0)
Bicarbonate: 22.6 mmol/L (ref 20.0–28.0)
FIO2: 98
O2 Saturation: 77.1 %
Patient temperature: 37
pCO2 arterial: 28.7 mmHg — ABNORMAL LOW (ref 32.0–48.0)
pH, Arterial: 7.507 — ABNORMAL HIGH (ref 7.350–7.450)
pO2, Arterial: 42.4 mmHg — ABNORMAL LOW (ref 83.0–108.0)

## 2021-02-11 LAB — RENAL FUNCTION PANEL
Albumin: 2.8 g/dL — ABNORMAL LOW (ref 3.5–5.0)
Anion gap: 13 (ref 5–15)
BUN: 31 mg/dL — ABNORMAL HIGH (ref 8–23)
CO2: 24 mmol/L (ref 22–32)
Calcium: 8.7 mg/dL — ABNORMAL LOW (ref 8.9–10.3)
Chloride: 101 mmol/L (ref 98–111)
Creatinine, Ser: 4.32 mg/dL — ABNORMAL HIGH (ref 0.61–1.24)
GFR, Estimated: 15 mL/min — ABNORMAL LOW (ref >60)
Glucose, Bld: 192 mg/dL — ABNORMAL HIGH (ref 70–99)
Phosphorus: 2.7 mg/dL (ref 2.5–4.6)
Potassium: 4.5 mmol/L (ref 3.5–5.1)
Sodium: 138 mmol/L (ref 135–145)

## 2021-02-11 LAB — GLUCOSE, CAPILLARY
Glucose-Capillary: 157 mg/dL — ABNORMAL HIGH (ref 70–99)
Glucose-Capillary: 145 mg/dL — ABNORMAL HIGH (ref 70–99)
Glucose-Capillary: 185 mg/dL — ABNORMAL HIGH (ref 70–99)
Glucose-Capillary: 145 mg/dL — ABNORMAL HIGH (ref 70–99)
Glucose-Capillary: 149 mg/dL — ABNORMAL HIGH (ref 70–99)
Glucose-Capillary: 96 mg/dL (ref 70–99)

## 2021-02-11 LAB — C-REACTIVE PROTEIN: CRP: 18.6 mg/dL — ABNORMAL HIGH (ref <1.0)

## 2021-02-11 LAB — SEDIMENTATION RATE: Sed Rate: 99 mm/hr — ABNORMAL HIGH (ref 0–16)

## 2021-02-11 MED ORDER — FAMOTIDINE 40 MG/5ML PO SUSR
20.0000 mg | Freq: Two times a day (BID) | ORAL | Status: DC
  Administered 2021-02-11 – 2021-02-24 (×27): 20 mg
  Filled 2021-02-11 (×31): qty 2.5

## 2021-02-11 MED ORDER — FENTANYL CITRATE PF 50 MCG/ML IJ SOSY
PREFILLED_SYRINGE | INTRAMUSCULAR | Status: AC
  Filled 2021-02-11: qty 2

## 2021-02-11 MED ORDER — JEVITY 1.5 CAL/FIBER PO LIQD
1000.0000 mL | ORAL | Status: DC
  Administered 2021-02-11 (×2): 1000 mL
  Filled 2021-02-11 (×4): qty 1000

## 2021-02-11 MED ORDER — ETOMIDATE 2 MG/ML IV SOLN
INTRAVENOUS | Status: AC
  Administered 2021-02-11: 20 mg
  Filled 2021-02-11: qty 20

## 2021-02-11 MED ORDER — APIXABAN 5 MG PO TABS: 5.0000 mg | ORAL_TABLET | Freq: Two times a day (BID) | ORAL | Status: DC

## 2021-02-11 MED ORDER — OXYCODONE HCL 5 MG/5ML PO SOLN
7.5000 mg | ORAL | Status: AC
Start: 2021-02-11 — End: 2021-02-11
  Administered 2021-02-11: 7.5 mg
  Filled 2021-02-11: qty 10

## 2021-02-11 MED ORDER — APIXABAN 5 MG PO TABS
5.0000 mg | ORAL_TABLET | Freq: Two times a day (BID) | ORAL | Status: DC
  Administered 2021-02-11 – 2021-02-13 (×5): 5 mg
  Filled 2021-02-11 (×5): qty 1

## 2021-02-11 MED ORDER — FENTANYL CITRATE PF 50 MCG/ML IJ SOSY: 12.5000 ug | PREFILLED_SYRINGE | INTRAMUSCULAR | Status: DC | PRN

## 2021-02-11 MED ORDER — FENTANYL CITRATE (PF) 100 MCG/2ML IJ SOLN: 50.0000 ug | Freq: Once | INTRAMUSCULAR | Status: DC

## 2021-02-11 MED ORDER — FENTANYL CITRATE PF 50 MCG/ML IJ SOSY: 50.0000 ug | PREFILLED_SYRINGE | INTRAMUSCULAR | Status: DC | PRN

## 2021-02-11 MED ORDER — SODIUM CHLORIDE 0.9 % IV SOLN
2.0000 g | Freq: Once | INTRAVENOUS | Status: DC
  Filled 2021-02-11: qty 2

## 2021-02-11 MED ORDER — ACETAMINOPHEN 160 MG/5ML PO SOLN
650.0000 mg | Freq: Four times a day (QID) | ORAL | Status: DC | PRN
  Administered 2021-02-17 – 2021-02-24 (×3): 650 mg
  Filled 2021-02-11 (×4): qty 20.3

## 2021-02-11 MED ORDER — PIPERACILLIN-TAZOBACTAM IN DEX 2-0.25 GM/50ML IV SOLN
2.2500 g | Freq: Three times a day (TID) | INTRAVENOUS | Status: DC
  Administered 2021-02-11 – 2021-02-12 (×3): 2.25 g via INTRAVENOUS
  Filled 2021-02-11 (×3): qty 50

## 2021-02-11 MED ORDER — VANCOMYCIN HCL 2000 MG/400ML IV SOLN
2000.0000 mg | Freq: Once | INTRAVENOUS | Status: AC
  Administered 2021-02-11: 2000 mg via INTRAVENOUS
  Filled 2021-02-11 (×2): qty 400

## 2021-02-11 MED ORDER — MIDAZOLAM HCL 2 MG/2ML IJ SOLN
INTRAMUSCULAR | Status: AC
  Filled 2021-02-11: qty 2

## 2021-02-11 MED ORDER — PROPOFOL 1000 MG/100ML IV EMUL
0.0000 ug/kg/min | INTRAVENOUS | Status: DC
  Filled 2021-02-11: qty 100

## 2021-02-11 MED ORDER — ZIPRASIDONE MESYLATE 20 MG IM SOLR
20.0000 mg | Freq: Once | INTRAMUSCULAR | Status: AC
  Administered 2021-02-11: 20 mg via INTRAMUSCULAR
  Filled 2021-02-11: qty 20

## 2021-02-11 MED ORDER — FENTANYL CITRATE PF 50 MCG/ML IJ SOSY
12.5000 ug | PREFILLED_SYRINGE | Freq: Once | INTRAMUSCULAR | Status: AC
Start: 2021-02-11 — End: 2021-02-11
  Administered 2021-02-11: 12.5 ug via INTRAVENOUS
  Filled 2021-02-11: qty 1

## 2021-02-11 MED ORDER — STERILE WATER FOR INJECTION IJ SOLN
INTRAMUSCULAR | Status: AC
  Administered 2021-02-11: 1.2 mL
  Filled 2021-02-11: qty 10

## 2021-02-11 MED ORDER — FENTANYL 2500MCG IN NS 250ML (10MCG/ML) PREMIX INFUSION: 0.0000 ug/h | INTRAVENOUS | Status: DC

## 2021-02-11 MED ORDER — SODIUM CHLORIDE 0.9 % IV SOLN: 2.0000 g | INTRAVENOUS | Status: DC

## 2021-02-11 MED ORDER — PROPOFOL 1000 MG/100ML IV EMUL: 5.0000 ug/kg/min | INTRAVENOUS | Status: DC

## 2021-02-11 MED ORDER — FENTANYL BOLUS VIA INFUSION
50.0000 ug | INTRAVENOUS | Status: DC | PRN
  Filled 2021-02-11: qty 100

## 2021-02-11 MED ORDER — SODIUM CHLORIDE 0.9 % IV SOLN
1.0000 g | Freq: Once | INTRAVENOUS | Status: DC
  Filled 2021-02-11: qty 1

## 2021-02-11 MED ORDER — SUCCINYLCHOLINE CHLORIDE 200 MG/10ML IV SOSY
PREFILLED_SYRINGE | INTRAVENOUS | Status: AC
  Filled 2021-02-11: qty 10

## 2021-02-11 MED ORDER — PANTOPRAZOLE 2 MG/ML SUSPENSION
40.0000 mg | Freq: Two times a day (BID) | ORAL | Status: DC
  Administered 2021-02-11 – 2021-02-24 (×28): 40 mg
  Filled 2021-02-11 (×28): qty 20

## 2021-02-11 MED ORDER — FENTANYL CITRATE PF 50 MCG/ML IJ SOSY
100.0000 ug | PREFILLED_SYRINGE | Freq: Once | INTRAMUSCULAR | Status: AC
  Administered 2021-02-11: 100 ug via INTRAVENOUS
  Filled 2021-02-11: qty 2

## 2021-02-11 MED ORDER — PROPOFOL 500 MG/50ML IV EMUL
INTRAVENOUS | Status: AC
  Administered 2021-02-11: 20 ug
  Filled 2021-02-11: qty 50

## 2021-02-11 MED ORDER — ROCURONIUM BROMIDE 10 MG/ML (PF) SYRINGE
PREFILLED_SYRINGE | INTRAVENOUS | Status: AC
  Filled 2021-02-11: qty 10

## 2021-02-11 MED ORDER — FENTANYL CITRATE (PF) 100 MCG/2ML IJ SOLN
100.0000 ug | Freq: Once | INTRAMUSCULAR | Status: AC
Start: 2021-02-11 — End: 2021-02-11

## 2021-02-11 MED ORDER — POLYETHYLENE GLYCOL 3350 17 G PO PACK
17.0000 g | PACK | Freq: Every day | ORAL | Status: DC
Start: 2021-02-11 — End: 2021-02-27
  Administered 2021-02-11 – 2021-02-27 (×16): 17 g
  Filled 2021-02-11 (×16): qty 1

## 2021-02-11 MED ORDER — CLONAZEPAM 0.1 MG/ML ORAL SUSPENSION
0.5000 mg | Freq: Three times a day (TID) | ORAL | Status: DC
  Filled 2021-02-11: qty 5

## 2021-02-11 MED ORDER — OXYCODONE HCL 5 MG/5ML PO SOLN
7.5000 mg | ORAL | Status: DC | PRN
  Administered 2021-02-12 – 2021-02-17 (×11): 7.5 mg
  Filled 2021-02-11 (×12): qty 10

## 2021-02-11 MED ORDER — CLONAZEPAM 0.5 MG PO TABS
0.5000 mg | ORAL_TABLET | Freq: Three times a day (TID) | ORAL | Status: DC
  Administered 2021-02-11 – 2021-02-12 (×3): 0.5 mg
  Filled 2021-02-11 (×3): qty 1

## 2021-02-11 MED ORDER — LEVETIRACETAM 100 MG/ML PO SOLN
500.0000 mg | Freq: Two times a day (BID) | ORAL | Status: DC
  Administered 2021-02-11 – 2021-02-24 (×28): 500 mg
  Filled 2021-02-11 (×31): qty 5

## 2021-02-11 MED ORDER — VANCOMYCIN HCL IN DEXTROSE 1-5 GM/200ML-% IV SOLN
1000.0000 mg | INTRAVENOUS | Status: DC
  Administered 2021-02-12: 1000 mg via INTRAVENOUS
  Filled 2021-02-11: qty 200

## 2021-02-11 MED ORDER — DOCUSATE SODIUM 50 MG/5ML PO LIQD
100.0000 mg | Freq: Two times a day (BID) | ORAL | Status: DC
  Administered 2021-02-11 – 2021-02-24 (×26): 100 mg
  Filled 2021-02-11 (×25): qty 10

## 2021-02-11 MED ORDER — FENTANYL 2500MCG IN NS 250ML (10MCG/ML) PREMIX INFUSION
50.0000 ug/h | INTRAVENOUS | Status: DC
  Administered 2021-02-11: 50 ug/h via INTRAVENOUS
  Filled 2021-02-11: qty 250

## 2021-02-11 MED ORDER — PROMETHAZINE HCL 6.25 MG/5ML PO SYRP
12.5000 mg | ORAL_SOLUTION | Freq: Four times a day (QID) | ORAL | Status: DC | PRN
  Filled 2021-02-11: qty 10

## 2021-02-11 MED ORDER — FENTANYL CITRATE (PF) 100 MCG/2ML IJ SOLN
INTRAMUSCULAR | Status: AC
  Administered 2021-02-11: 100 ug via INTRAVENOUS
  Filled 2021-02-11: qty 2

## 2021-02-11 NOTE — Progress Notes (Addendum)
[facility] PROGRESS NOTE  Thomas [name] ACZ:[id number] DOB: 1955-10-19 DOA: 11/30/2020 PCP: [name], NP  Status: Remains inpatient appropriate because:Persistent severe electrolyte disturbances, Unsafe d/c plan, and Inpatient level of care appropriate due to severity of illness  Dispo: The patient is from: Home              Anticipated d/c is to: SNF versus LTAC vs CIR              Patient currently is not medically stable to d/c.   Difficult to place patient Yes               Barriers to discharge: Currently has tracheostomy in place and is requiring dialysis but remains too weak to tolerate dialysis in chair which is a requirement for outpatient HD   Level of care: Progressive  Code Status: Full Family Communication: Attending physician spoke with wife at bedside 9/26 DVT prophylaxis: FD Lovenox COVID vaccination status: Moderna 07/08/2019 and 08/11/2019 with first booster dose given 08/05/2020   HPI:  65 y.o. male with a history of T2DM with neuropathy, HTN, HLD., colon cancer s/p resection and colostomy May 2022 s/p 3 cycles chemotherapy (last was July 2022). Followed by Dr. Benay Spice. Started CAPOX June 2022, reduced cepecitabine dose starting 11/19/2020 due to hand/foot syndrome, reduced oxaliplatin due to renal impairment and poor performance status. Presented to the ED 7/24 with abdominal pain found to be febrile, tachycardic, tachypneic. Lactic acid was 6.6 and persisted at 6.8 after sepsis IV fluid bolus and cefepime. He had ARF, demand ischemia, bandemia, was hyperglycemic with negative ketones, with lactic acidosis. He developed AFib with RVR in the ED, was started on diltiazem, and was admitted to the ICU for septic shock with unclear source. Sepsis was subsequently felt to be ruled out at time of admission, presentation felt more likely to be related to cepacitabine toxicity with oral mucositis, hand/foot syndrome, and diarrhea/high output colostomy.   He was transferred  to hospitalist service 7/27 but developed metabolic encephalopathy with hyoxia due to aspiration pneumonia on 7/30 requiring intubation and pressor support. Failed extubation with inability to clear secretions, requiring reintubation 8/8, and tracheostomy subsequently placed 8/11. This was pulled out 8/22, replaced under bronch guidance, again removed 8/25 requiring reintubation after failed reinsertion. ENT revised tracheostomy in the OR 8/29. He has required IHD for ARF, and also had complications of RLE DVT now on eliquis, colitis thought to be ischemic 8/2 not requiring specific intervention, Pseudomonas pneumonia having completed treatment, agitated encephalopathy which has improved, anemia requiring 4u PRBCs total, and thrombocytopenia which has resolved. On 9/4 he was transferred to hospitalist service having tolerated trach collar.  Patient was also evaluated by Dr. Benay Spice during this admission clarified that patient was receiving curative chemotherapy prior to admission  Subjective: Patient very anxious and reporting significant pain with palpation around PEG tube.  Site otherwise unremarkable.  IR states okay to use PEG.  Patient made aware we would be giving longer acting pain medications through the PEG tube but allow for the current IV fentanyl breakthrough pain.  Made aware of patient's pain and anxiety and orders placed for now doses of medications.  Objective: Vitals:   02/11/21 0307 02/11/21 0328  BP:  126/76  Pulse: (!) 108 (!) 106  Resp: 20 (!) 24  Temp:    SpO2:  93%    Intake/Output Summary (Last 24 hours) at 02/11/2021 0749 Last data filed at 02/11/2021 0648 Gross per 24 hour  Intake 809.79  ml  Output 1626 ml  Net -816.21 ml   Filed Weights   02/10/21 1330 02/10/21 1712 02/11/21 0500  Weight: 99.2 kg 98 kg 96.7 kg    Exam: Constitutional: NAD, anxious and requesting staff to notify wife that he wants her to come as soon as possible Respiratory: #6 XLT uncuffed  trach, RA to TC, splinting respiratory effort secondary to peri-PEG site discomfort.  Lungs are otherwise clear to auscultation.  Pulse oximetry 93 to 95% Cardiovascular: S1-S2, normotensive, no peripheral edema.  Sinus tachycardia which resolved with adequate pain control Abdomen: Abdomen flat, tender around PEG site but PEG site otherwise unremarkable.  Active bowel sounds.  Colostomy stoma pink with liquid stool Genitourinary: External cath in place with no urinary output and had about 150 cc out over the past 24 hours with normal bladder scan Neurologic: CN 2-12 grossly intact. Sensation intact, Strength 3/5 x all 4 extremities.  Psychiatric: Oriented x3 but extremely anxious.  **12:05 PM: Reviewing patient's [name] signs and noticed significant decrease in pulse oximetry readings.  Went to bedside to assess patient.  Patient with increased work of breathing with variable O2 saturations between 75 and 83.  Posterior lung sounds diminished bilaterally but hindered by patient's inability to sit upright effectively and grunting/moaning during respiratory pattern.  RT increased FiO2 to 80% / 10 L/min.  Pulse ox probe placed on ear improved readings of around 92% although he does drop down to 88% and temporarily gets as high as 95%.  Tube feedings placed on hold.  On abdominal exam he was not having any pain with palpation.  Stat portable chest x-ray revealed asymmetric changes that are either edema or fluffy infiltrates.  Attending physician updated.  Nephrologist Dr. Johnney Ou updated as well.  Given the fact that patient completed a full session of dialysis yesterday and 1.5 L was removed she did not suspect this to be edematous in nature.  CBC, procalcitonin, CRP and ESR ordered.  Sputum culture ordered and given history of Pseudomonas pneumonia that was pansensitive pharmacy to dose of Tressie  was ordered to treat likely pneumonia process.  ABG revealed a respiratory alkalosis with a pH of 7.507 and a PCO2  of 28.7.  Patient was also hypoxemic with a PaO2 of 42.4.  Discussed with pulmonary medicine who will come by and see the patient.  They suspect possible early plugging issues so recommended frequent suctioning-now ordered for every 2 hours as opposed to as needed.  PCCM will see patient today.  Wife at bedside during entire encounter and updated.  She was shown x-ray film as well.  Patient also updated on need for at least short-term frequent suctioning given concerns over pulmonary plugging issues.  Hold tube feedings until cleared by PCCM in the event patient needs to undergo emergent bronchoscopy   Assessment/Plan: Acute problems:  Acute hypoxic respiratory failure s/p tracheostomy due to aspiration pneumonia  Recurrent pansensitive Pseudomonas tracheobronchitis -Tolerating PMV.  Trach team wants to make sure patient more mobile with stronger vocalization before attempt capping trials. -Decrease in O2 saturations today likely secondary to splinting from pain secondary to PEG site placement-focus will be on adequate pain control -Continue Robinul  -SLP following for PMV training.  Grade LA C reflux esophagitis/nonbleeding duodenal ulcers -EGD 9/30 with the above findings -H. pylori pending -Patient needs to continue PPI for total of 4-weeks (initiated 9/29)-have transitioned Protonix and Pepcid to oral route -For now continue scheduled Zofran -Follow-up EGD recommended after discharge  Inability to elevate tongue  -  Neurology has documented that it is very rare to have bilateral hypoglossal nerve injury in context of intubated/reintubation--neuro suspects related to mucositis, oral thrush, hand/foot syndrome -10/3 MBS demonstrated silent aspiration and significant difficulty with tongue and upper motility noting even the hyoid bone/surrounding muscular structures are not moving with attempts to swallow -It is hopeful that with treatment of severe mucositis as well as severe esophagitis that  swallowing will improve -Of note all the motility issues are well above the trach insertion site so do not suspect trach contributing   Dysphagia/protein calorie malnutrition -Failed MBSS-PEG placed 10/4 -Continue D5NS w/ KCL decrease rate to 50 cc/h until can resume TFs-1/3 K+ was 3.5  MRI with bilateral punctate lesion (Frontal watershed) -SLP evaluated and noted patient is unable to elevate tongue properly bilaterally-continues to have difficulty with swallowing and phonation -Imaging consistent with acute infarcts -Stroke team following-etiology of infarcts yet to be determined-differential includes thrombotic etiology from either A. fib or from DVT if has PFO -Continue Lovenox-currently on hold for PEG tube placement and will be resumed after procedure -statin currently on hold due to recent GI issues, Hgba1c 7/25 was 8.7; HDL cholesterol is low, LDL cholesterol at goal -Recommendation is follow-up MRI in 6 to 8 weeks with and without contrast since not clear as to whether this could also be metastatic in etiology  Acute pain -Decrease frequency of Fentanyl to q 4 hrs prn- continue same low dose of 12.5 mcg -Given lethargy this morning reviewed last dose of fentanyl and recent doses given  Urinary retention -Has completed Diflucan as of 9/28 -Foley catheter discontinued on 10/3 without evidence of recurrent urinary retention   Acute renal failure on stage IIIb CKD: Presumed ischemic ATN -started CRRT 7/31 > iHD 8/12 s/p left IJ [facility] 8/15.   - Continue HD TTS -Still unclear if will recover renal function   High output ostomy, diarrhea:  -Infectious work-up negative -Recent PSBO likely caused by Imodium and discontinued  Funguria/Thrush -Diflucan completed 9/27 -Continue nystatin   Anemia of critical illness, acute blood loss as well as anemia of chronic disease and iron deficiency:  - s/p 4u PRBCs this hospitalization  - IV iron TTS x8 doses per nephrology. -Recent  hemoglobin stable at 10.6 with normal platelets   Stage IIIb colon CA s/p partial transverse colectomy with end colostomy May 2022:  - Palliative care has been involved during this hospitalization- goals remain clear: full aggressive measures desired.   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   Uncontrolled T2DM on OHAs prior to admission with associated neuropathy:  -HbA1c 8.7% at admission. - Continue levemir and SSI  -Continue to hold gabapentin and gradually reintroduce medications   HTN:  -Norvasc held secondary to recent nausea and vomiting -BP has been relatively well controlled    HLD:  -Atorvastatin on hold due to nausea and vomiting   Thrombocytopenia:  -Transient likely due  to critical illness. HIT Ab negative on 8/5.    ?Sepsis - initially thought to be present on admission- has been ruled out.    Pressure Injury 02/05/21 Coccyx Lower Stage 2 -  Partial thickness loss of dermis presenting as a shallow open injury with a red, pink wound bed without slough. healing quarter sized spot on sacrum (Active)  Date First Assessed/Time First Assessed: 02/05/21 0800   Location: Coccyx  Location Orientation: Lower  Staging: Stage 2 -  Partial thickness loss of dermis presenting as a shallow open injury with a red, pink wound bed without slough.   Wound Descript...    Assessments 02/05/2021  5:10 PM 02/10/2021  8:30 AM  Dressing Type Foam - Lift dressing to assess site every shift Foam - Lift dressing to assess site every shift  Dressing Changed Clean;Dry;Intact  State of Healing Early/partial granulation --  Site / Wound Assessment Pink --  % Wound base Red or Granulating 100% --  Wound Length (cm) 2 cm --  Wound Width (cm) 2 cm --  Wound Surface Area (cm^2) 4 cm^2 --  Margins Attached edges (approximated) --  Drainage Amount None --  Treatment Cleansed --     No Linked orders to display     Data Reviewed: Basic Metabolic Panel: Recent Labs  Lab 02/06/21 0434 02/07/21 0258 02/08/21 0500 02/09/21 0415 02/10/21 0407 02/11/21 0432  NA 139 137 138 139 143 138  K 4.3 3.7 3.4* 3.5 3.5 4.5  CL 105 102 101 103 108 101  CO2 20* 19* 24 22 23 24   GLUCOSE 133* 134* 165* 165* 158* 192*  BUN 72* 32* 28* 46* 58* 31*  CREATININE 6.71* 4.22* 4.13* 5.47* 6.26* 4.32*  CALCIUM 9.0 9.0 9.0 9.1 9.1 8.7*  MG 1.9 1.8 1.8 1.9 1.9  --   PHOS 6.3* 2.3* 4.2 3.8 3.8 2.7   Liver Function Tests: Recent Labs  Lab 02/07/21 0258 02/08/21 0500 02/09/21 0415 02/10/21 0407 02/11/21 0432  ALBUMIN 2.8* 2.9* 2.8* 2.7* 2.8*    CBC: Recent Labs  Lab 02/07/21 0952 02/10/21 0407  WBC 18.4* 15.6*  HGB 8.7* 8.1*  HCT 26.4* 24.2*  MCV 85.4 84.9  PLT 336 323    CBG: Recent Labs  Lab 02/10/21 0807 02/10/21 1800 02/10/21 1915 02/10/21 2344 02/11/21 0345  GLUCAP 153* 126* 124* 146* 157*    Scheduled Meds:  apixaban  5 mg Oral BID   chlorhexidine gluconate (MEDLINE KIT)  15 mL Mouth Rinse BID   Chlorhexidine Gluconate Cloth  6 each Topical Q0600   famotidine  20 mg Per Tube BID   feeding supplement (NEPRO CARB STEADY)  1,000 mL Per Tube Q24H   feeding supplement (PROSource TF)  45 mL Per Tube BID   hydrocerin   Topical BID   insulin aspart  0-15 Units Subcutaneous Q4H   insulin detemir  10 Units Subcutaneous Daily   mouth rinse   15 mL Mouth Rinse 10 times per day   nystatin  5 mL Mouth/Throat QID   ondansetron  4 mg Intravenous Q6H   pantoprazole sodium  40 mg Per Tube BID   scopolamine  1 patch Transdermal Q72H   sodium chloride HYPERTONIC  4 mL Nebulization BID   Continuous Infusions:  sodium chloride Stopped (12/20/20 0955)   anticoagulant sodium citrate     feeding supplement (JEVITY 1.5 CAL/FIBER)     levETIRAcetam 500 mg (02/10/21 1937)   promethazine (PHENERGAN) injection (IM or IVPB) 12.5 mg (02/05/21 0416)  Principal Problem:   Diabetes mellitus with hyperosmolarity without hyperglycemic hyperosmolar nonketotic coma ([place]) Active Problems:   Lactic acidosis   Acute renal failure superimposed on stage 2 chronic kidney disease (HCC)   Metabolic acidosis   Tachycardia   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 (HCC)   Adverse effect of chemotherapy   Pressure injury of skin   Atrial fibrillation with RVR (HCC)   Sepsis (HCC)   Malnutrition of moderate degree   Acute metabolic encephalopathy   Acute respiratory failure with hypoxemia (HCC)   Acute renal failure with acute cortical necrosis (HCC)   Shock ([place])   Status post tracheostomy ([place])   Endotracheal tube present   Hemodialysis status ([place])   Palliative care by specialist   SOB (shortness of breath)   Non-intractable vomiting   Abdominal pain, epigastric   Cerebral embolism with cerebral infarction   Acute esophagitis   Duodenal ulcer disease   Consultants: PCCM General surgery Nephrology Hematology/oncology Palliative care medicine team  Procedures: 2u PRBC 7/31, 1u PRBC 8/17, 1u PRBC 8/27 Tracheostomy revision 01/05/2021 Dr. Constance Holster [facility] 8/22    Antibiotics: Vancomycin 7/24 Cefepime 7/24 - 7/27 Unasyn 7/30 >> cefepime/flagyl 7/31 - 8/6     Time spent: 40 minutes    [name] ANP  [facility] 7 am - 330 pm/M-F for direct patient care and secure chat Please refer to Amion for  contact info 72  days

## 2021-02-11 NOTE — Progress Notes (Signed)
SLP Cancellation Note  Patient Details Name: Thomas [name] MRN: [id number] DOB: 05/01/1956   Cancelled treatment:       Reason Eval/Treat Not Completed: Patient at procedure or test/unavailable (Pt currently working with physical therapy. SLP will follow up.)  Kadin Canipe I. Hardin Negus, [place], [place] Office number [phone] Pager [phone]  Horton Marshall 02/11/2021, 10:30 AM

## 2021-02-11 NOTE — Progress Notes (Signed)
Patient took out his trache, RT/Rapid response called at bedside. New trache put in. Patient remains restless and agitated. PRN Ativan administered. See mar for details.

## 2021-02-11 NOTE — Progress Notes (Signed)
02/11/2021  Called back for worsening decompensation. Needing to be bagged now.  Desatting to 60s Will unfortunately need mechanical ventilation. Existing shiley removed, new cuffed one placed. Etomidate 20mg  given. Hooked to ventilator. Will use propofol, f/u CT chest and consider bronch. Will update wife.  Additional 31 min cc time spent on above  Erskine Emery MD PCCM

## 2021-02-11 NOTE — Plan of Care (Signed)

## 2021-02-11 NOTE — Progress Notes (Signed)
Pharmacy Antibiotic Note  Thomas [name] is a 65 y.o. male admitted on 11/30/2020 with complex medical history and extended hospital stay for AKI on CKD, sepsis and now concern for pneumonia.  Pharmacy has been consulted for Ceftazadime dosing. Patient currently is on TTS dialysis schedule.   Plan: Ceftazidime 1gm IV today, then continue with 2gm after HD-TTS Follow up clinical progress, duration of therapy  Height: 5\' 8"  (172.7 cm) Weight: 96.7 kg (213 lb 3 oz) IBW/kg (Calculated) : 68.4  Temp (24hrs), Avg:97.8 F (36.6 C), Min:97.4 F (36.3 C), Max:98.5 F (36.9 C)  Recent Labs  Lab 02/07/21 0258 02/07/21 0952 02/08/21 0500 02/09/21 0415 02/10/21 0407 02/11/21 0432  WBC  --  18.4*  --   --  15.6*  --   CREATININE 4.22*  --  4.13* 5.47* 6.26* 4.32*    Estimated Creatinine Clearance: 19.5 mL/min (A) (by C-G formula based on SCr of 4.32 mg/dL (H)).    Allergies  Allergen Reactions   Morphine And Related Other (See Comments)    Throat swelling    Antimicrobials this admission: Fluconazole 9/09 > 9/15, 9/22 >9/27 Ceftazidime 9/08 > 9/18; 10/05 >> Nystatin oral 9/09 > 9/22, 9/26 >> Cefepime 7/24 > 7/28, 7/31 > 8/07   Vanc x1 7/25, 8/01 > 8/02 Unasyn 7/30 > 7/31; 9/19 > 9/22 Flagyl 7/31 > 8/07 Eraxis 8/01 > 8/02 Cefazolin 8/15 > 8/16 Ceftriaxone 8/16 > 8/17; x 1 on 9/07 Cefepime 8/17 > 8/22  Microbiology results: 7/31 Trach: candida tropicalis (likely not pathogenic) 8/15 TA: abundant GPCs, Pseudomonas 9/07 TA: moderate Pseudomonas 9/07 UCx: multiple species 9/19 UCx (cath): yeast   Thank you for allowing pharmacy to be a part of this patient's care.  Ardyth Harps, PharmD Clinical Pharmacist

## 2021-02-11 NOTE — Procedures (Addendum)
Bronchoscopy Procedure Note  MOUHAMADOU GITTLEMAN  [id number]  1955-09-07  Date:02/11/21  Time:5:34 PM   Provider Performing:Shahir Karen N Ajanae Virag   Procedure(s):  Flexible bronchoscopy with bronchial alveolar lavage (75916)  Indication(s) Respiratory distress, left upper lobe mucous plugging   Consent Risks of the procedure as well as the alternatives and risks of each were explained to the patient and/or caregiver.  Consent for the procedure was obtained and is signed in the bedside chart  Anesthesia Propofol and fentanyl   Time Out Verified patient identification, verified procedure, site/side was marked, verified correct patient position, special equipment/implants available, medications/allergies/relevant history reviewed, required imaging and test results available.  Sterile Technique Usual hand hygiene, masks, gowns, and gloves were used  Procedure Description Bronchoscope advanced through tracheostomy tube and into airway.  Airways were examined down to subsegmental level with findings noted below.   Following diagnostic evaluation, BAL(s) performed in LUL with normal saline and return of mucous purulent fluid.  Findings: Trachea appears normal. Significant mucous plugging of the LUL with BAL and suctioning up until the subsegmental region.    Complications/Tolerance None; patient tolerated the procedure well. Chest X-ray is not needed post procedure.   EBL none   Specimen(s) Ordered respiratory culture and cell count from

## 2021-02-11 NOTE — Progress Notes (Signed)
[name] Progress Note   65 year old gentleman with a history of colon cancer status post resection and colostomy May 2022.  Status post 3 cycles of chemotherapy last was in July 2022.  Type 2 diabetes with neuropathy hypertension hyperlipidemia presented to the emergency room 11/30/2020 with abdominal pain.  Was found to be secondary to sepsis.  Patient also was found to have acute kidney injury.  Developed atrial fibrillation with rapid ventricular rate and was admitted to intensive care unit for septic shock.  On 12/03/2020 he developed metabolic encephalopathy with hypoxia and aspiration pneumonia and required intubation 12/06/2020.  He also required pressor support.  He was reintubated since 12/15/2020 tracheostomy placed.  His hospital course  complicated by DVT.  It appears that he has recurrent Pseudomonas pneumonia.  He also has recurrent anemia requiring transfusion of 4 units of packed red blood cells.  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 CRRT initiated 7/31 and transitioned to IHD on 12/19/20.  S/p LIJ TDC placement 12/22/20.  Monitoring for signs of recovery but urine output has been fairly minimal and he remains dialysis dependent on TTS schedule currently - HD tomorrow.   2.Urinary retention   Foley catheter removed 10/3, following bladder scans. Has condom cath on now.    3.Volume overload/anasarca-improved with dialysis.   4.Sepsis/aspiration pneumonia/VDRF:aspiration pneumonia.  s/p trach   4.Stage IIIB colon cancer - sp partial colectomy, colostomy, completed chemoRx sp 3 cycles in 11/2020 - per notes planned for 4 cycles, 3rd cycle limited by toxicities/status.    5.Right leg DVT: On Eliquis.   6.Atrial fibrillation - on amiodarone and anticoagulation.     7.Anemia of critical illness:  transfuse as needed, holding erythropoietin with malignancy.  Underwent EGD on  9/30.   8. CKD-MBD; started sevelamer for hyperphosphatemia on 9/8, monitor phosphorus level.Currently < 5.5.   9. CVA, incidental:  neuro performed w/u and has signed off - f/u in clinic planned.    9. Disposition -  Seen by palliative care team.  Discussion ongoing for possible transfer to [facility].  Having G tube placed.   Subjective:   Patient sleepy this AM.  S.p G tube placement yesterday. Self d/cd trach overnight, RT replaced and he tolerated well.  Anxiety treated with benzo. UF 1.6L with HD yesterday no UOP recorded   Objective:   BP 126/76   Pulse (!) 106   Temp 97.8 F (36.6 C)   Resp (!) 24   Ht _0  (1.727 m)   Wt 96.7 kg   SpO2 93%   BMI 32.41 kg/m   Intake/Output Summary (Last 24 hours) at 02/11/2021 5027 Last data filed at 02/10/2021 1948 Gross per 24 hour  Intake 1114.94 ml  Output 1626 ml  Net -511.06 ml    Weight change: 0.9 kg  Physical Exam: General: Chronically ill-appearing, lying in bed, no distress  Heart: Normal rate Lungs: normal wob, bilateral chest rise, rhonchi without rales Abdomen:soft, Non-tender, non-distended, LUQ G tube dressing intact Extremities: Minimal edema, warm and well perfused Neurology: Alert awake and following commands  Dialysis Access: LIJ TDC in place.   R chest wall port GU: condom draining cloudy urine, bladder nontender with palpation    Imaging: IR GASTROSTOMY TUBE MOD SED  Result Date: 02/10/2021 CLINICAL DATA:  Oropharyngeal dysphagia, needs enteral feeding support EXAM: PERC PLACEMENT GASTROSTOMY FLUOROSCOPY TIME:  3 minute 7 seconds; 44 mGy TECHNIQUE:  The procedure, risks, benefits, and alternatives were explained to the patient. Questions regarding the procedure were encouraged and answered. The patient understands and consents to the procedure. As antibiotic prophylaxis, cefazolin 2 g was ordered pre-procedure and administered intravenously within one hour of incision. Appropriate percutaneous approach  was confirmed on recent CT. A 5 French angiographic catheter was placed as orogastric tube. The upper abdomen was prepped with Betadine, draped in usual sterile fashion, and infiltrated locally with 1% lidocaine. Intravenous Fentanyl 162mg and Versed 378mwere administered as conscious sedation during continuous monitoring of the patient's level of consciousness and physiological / cardiorespiratory status by the radiology RN, with a total moderate sedation time of 13 minutes. 0.5 mg glucagon given IV. Stomach was insufflated using air through the orogastric tube. An 1845rench sheath needle was advanced percutaneously into the gastric lumen under fluoroscopy. Gas could be aspirated and a small contrast injection confirmed intraluminal spread. The sheath was exchanged over a guidewire for a 9 FrPak[place]ascular sheath, through which the snare device was advanced and used to snare a guidewire passed through the orogastric tube. This was withdrawn, and the snare attached to the 20 French pull-through gastrostomy tube, which was advanced antegrade, positioned with the internal bumper securing the anterior gastric wall to the anterior abdominal wall. Small contrast injection confirms appropriate positioning. The external bumper was applied and the catheter was flushed. COMPLICATIONS: COMPLICATIONS none IMPRESSION: 1. Technically successful 20 French pull-through gastrostomy placement under fluoroscopy. Electronically Signed   By: D Lucrezia Europe.D.   On: 02/10/2021 14:41   DG Swallowing Func-Speech Pathology  Result Date: 02/09/2021 Table formatting from the original result was not included. Objective Swallowing Evaluation: Type of Study: MBS-Modified Barium Swallow Study  Patient Details Name: Thomas [name]: [id number] of Birth: [birth year]oday's Date: 02/09/2021 Time: SLP Start Time (ACUTE ONLY): 1330 -SLP Stop Time (ACUTE ONLY): 1430 SLP Time Calculation (min) (ACUTE ONLY): 60 min Past Medical History: Past  Medical History: Diagnosis Date  Acute ischemic left MCA stroke ([facility]/05/2013  Acute respiratory failure with hypoxia ([facility]/05/2013  Aspiration pneumonia ([facility]  Asthma   as a child  Brain tumor ([facility]  frontal  Cancer ([facility]  Confusion   occasionally  Diabetes mellitus without complication ([facility]  takes Metformin daily.  Dizziness   Dyspnea   pt states d/t weight  Epilepsy ([facility]  takes Keppra daily  GERD (gastroesophageal reflux disease)   takes Omeprazole daily  Headache   Hyperlipidemia   takes Atorvastatin daily  Hypertension   takes Lotrel daily  Peripheral edema   takes Lasix daily  Peripheral neuropathy   takes Gabapentin daily  Pneumonia 3 yrs ago  hx of  Seizures ([facility]  Sleep apnea   Urinary frequency  Past Surgical History: Past Surgical History: Procedure Laterality Date  BIOPSY  09/05/2020  Procedure: BIOPSY;  Surgeon: PeIrene ShipperMD;  [facility];  Service: Endoscopy;;  BIOPSY  02/06/2021  Procedure: BIOPSY;  Surgeon: PyJerene BearsMD;  [facility];  Service: Gastroenterology;;  CARDIAC CATHETERIZATION  7 yrs ago  COLONOSCOPY WITH PROPOFOL N/A 09/05/2020  Procedure: COLONOSCOPY WITH PROPOFOL;  Surgeon: PeIrene ShipperMD;  [facility] Service: Endoscopy;  Laterality: N/A;  CRANIOTOMY N/A 09/01/2016  Procedure: CRANIOTOMY TUMOR  LEFT PTERIONAL;  Surgeon: KyAshok PallMD;  [facility] Service: Neurosurgery;  Laterality: N/A;  cyst removed from chest     as a child  ESOPHAGOGASTRODUODENOSCOPY (EGD) WITH PROPOFOL N/A 02/06/2021  Procedure: ESOPHAGOGASTRODUODENOSCOPY (EGD) WITH PROPOFOL;  Surgeon: Jerene Bears, MD;  Location: [place];  Service: Gastroenterology;  Laterality: N/A;  IR FLUORO GUIDE CV LINE LEFT  12/22/2020  IR US GUIDE VASC ACCESS LEFT  12/22/2020  NO PAST SURGERIES    PARTIAL COLECTOMY N/A 09/10/2020  Procedure: TRANSVERSE COLECTOMY;  Surgeon: [name], MD;  Location: [place];  Service: General;  Laterality: N/A;  POLYPECTOMY  09/05/2020   Procedure: POLYPECTOMY;  Surgeon: Irene Shipper, MD;  Location: [place];  Service: Endoscopy;;  PORTACATH PLACEMENT Right 10/01/2020  Procedure: INSERTION PORT-A-CATH;  Surgeon: [name], MD;  Location: [place];  Service: General;  Laterality: Right;  SUBMUCOSAL TATTOO INJECTION  09/05/2020  Procedure: SUBMUCOSAL TATTOO INJECTION;  Surgeon: Irene Shipper, MD;  Location: [place];  Service: Endoscopy;;  TRACHEOSTOMY TUBE PLACEMENT N/A 01/05/2021  Procedure: TRACHEOSTOMY;  Surgeon: [name], MD;  Location: [place];  Service: ENT;  Laterality: N/A; HPI: Pt is a 64 y/o male admitted on 7/25 for worsening sepsis vs reaction to chemotherapy with high ostomy output.  Pt developed worsening hypoxia and was requiring BIPAP w/ FIO2 of 100% and increased work of breathing. SLP consulted due to concern for aspiration. BSE 7/28 recommended NPO status and MBS, but this could not completed subsequently due to lethargy. CXR in AM of 7/30 showed some worsening concern of aspiration. ETT 7/30-8/8; tolerated nasal cannula for about an hour; reintubated 8/8-trach 8/11. Pt pulled out trach x2 8/22; replaced by PCCM. Bronch on 8/22 revealed endotracheal granulation tissue at trach site. Trach removed again on 8/25; unable to reinsert despite multiple attenpts; pt reintubated 8/25- trach by ENT on 8/29. BSE 8/22 limited to ice chips due to pt's refusal of other consistencies; rx NPO status with Cortrak.EGD 9/30:grade C reflux esophagitis/non-bleeding duodenal ulcers.  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  Subjective: alert, cortrak removed Assessment / Plan / Recommendation [facility] IP CLINICAL IMPRESSIONS 02/09/2021 Clinical Impression   Pt continues to present with a  severe oral and pharyngeal dysphagia with improved visibility of the pharyngeal phase during today's study.  There was limited mobility of the tongue, with some elevation of the superior surface, but poor tongue tip elevation and base of tongue retraction. The chair was moved to a reclined position in an effort to allow gravity to assist with bolus transfer.  Five and ten ml boluses of nectar and thin liquid were administered from a syringe into mid-oral cavity.  Pt demonstrated effort to manipulate material; however, liquids generally transitioned passively into pharynx.  Repetitive attempts to swallow were observed - there was severe impairment in the mobility of the larynx, limited pharyngeal squeeze, and incomplete epiglottic inversion over the larynx.  Liquids filled the valleculae and pyriforms and spilled into the airway before, during, and after efforts to swallow.  Aspiration did not generally elicit a cough response.  Study was discontinued.  MBS was observed in real time by pt's wife and daughter.  We discussed results.  Results were also reviewed with [name].  Etiology of such severe dysphagia is unclear.  Neurological findings would not generally lead to such severe impairment. Pt's voice is improving (PMV in use during study), and his deficits are not consistent with the  type of laryngeal trauma associated with oral intubation. There is potential for the inflammation seen on EGD and degree of thrush on tongue to also impact pharynx/larynx (not visible via MBS and not noted per EGD) and interfere with function. For now, recommend continuing NPO except ice chips. Mr. and Mrs. Baumgart have been amenable to discussing PEG.  SLP will follow for therapeutic exercises if he is able and will explore potential for free water protocol. SLP Visit Diagnosis Dysphagia, oropharyngeal phase (R13.12) Attention and concentration deficit following -- Frontal lobe and executive function deficit following -- Impact  on safety and function Severe aspiration risk;Risk for inadequate nutrition/hydration   [facility] IP TREATMENT RECOMMENDATION 02/09/2021 Treatment Recommendations Therapy as outlined in treatment plan below   Prognosis 02/09/2021 Prognosis for Safe Diet Advancement Fair Barriers to Reach Goals Time post onset;Severity of deficits Barriers/Prognosis Comment -- [facility] IP DIET RECOMMENDATION 02/09/2021 SLP Diet Recommendations Other (Comment) Liquid Administration via -- Medication Administration Via alternative means Compensations -- Postural Changes --   [facility] IP OTHER RECOMMENDATIONS 02/09/2021 Recommended Consults -- Oral Care Recommendations Oral care QID Other Recommendations --   [facility] IP FOLLOW UP RECOMMENDATIONS 02/09/2021 Follow up Recommendations Skilled Nursing facility   [facility] IP FREQUENCY AND DURATION 02/09/2021 Speech Therapy Frequency (ACUTE ONLY) min 2x/week Treatment Duration 2 weeks      [facility] IP ORAL PHASE 02/09/2021 Oral Phase Impaired Oral - Pudding Teaspoon -- Oral - Pudding Cup -- Oral - Honey Teaspoon -- Oral - Honey Cup -- Oral - Nectar Teaspoon Weak lingual manipulation;Incomplete tongue to palate contact;Reduced posterior propulsion;Right pocketing in lateral sulci;Left pocketing in lateral sulci;Pocketing in anterior sulcus;Lingual/palatal residue;Delayed oral transit;Decreased bolus cohesion Oral - Nectar Cup -- Oral - Nectar Straw -- Oral - Thin Teaspoon Weak lingual manipulation;Incomplete tongue to palate contact;Reduced posterior propulsion;Right pocketing in lateral sulci;Left pocketing in lateral sulci;Pocketing in anterior sulcus;Lingual/palatal residue;Delayed oral transit;Decreased bolus cohesion Oral - Thin Cup -- Oral - Thin Straw -- Oral - Puree NT Oral - Mech Soft -- Oral - Regular NT Oral - Multi-Consistency -- Oral - Pill -- Oral Phase - Comment --  [facility] IP PHARYNGEAL PHASE 02/09/2021 Pharyngeal Phase Impaired Pharyngeal- Pudding Teaspoon -- Pharyngeal -- Pharyngeal- Pudding Cup -- Pharyngeal --  Pharyngeal- Honey Teaspoon -- Pharyngeal -- Pharyngeal- Honey Cup -- Pharyngeal -- Pharyngeal- Nectar Teaspoon Delayed swallow initiation-pyriform sinuses;Reduced pharyngeal peristalsis;Reduced epiglottic inversion;Reduced anterior laryngeal mobility;Reduced laryngeal elevation;Reduced airway/laryngeal closure;Reduced tongue base retraction;Penetration/Aspiration before swallow;Penetration/Aspiration during swallow;Penetration/Apiration after swallow;Moderate aspiration;Pharyngeal residue - pyriform;Pharyngeal residue - valleculae Pharyngeal -- Pharyngeal- Nectar Cup -- Pharyngeal -- Pharyngeal- Nectar Straw -- Pharyngeal -- Pharyngeal- Thin Teaspoon Delayed swallow initiation-pyriform sinuses;Reduced pharyngeal peristalsis;Reduced epiglottic inversion;Reduced anterior laryngeal mobility;Reduced laryngeal elevation;Reduced airway/laryngeal closure;Reduced tongue base retraction;Penetration/Aspiration before swallow;Penetration/Aspiration during swallow;Penetration/Apiration after swallow;Moderate aspiration;Pharyngeal residue - pyriform;Pharyngeal residue - valleculae Pharyngeal Material enters airway, passes BELOW cords without attempt by patient to eject out (silent aspiration);Material enters airway, passes BELOW cords and not ejected out despite cough attempt by patient Pharyngeal- Thin Cup NT Pharyngeal -- Pharyngeal- Thin Straw -- Pharyngeal -- Pharyngeal- Puree NT Pharyngeal -- Pharyngeal- Mechanical Soft -- Pharyngeal -- Pharyngeal- Regular NT Pharyngeal -- Pharyngeal- Multi-consistency -- Pharyngeal -- Pharyngeal- Pill -- Pharyngeal -- Pharyngeal Comment --  CH[facility] IP CERVICAL ESOPHAGEAL PHASE 01/19/2021 Cervical Esophageal Phase (No Data) Pudding Teaspoon -- Pudding Cup -- Honey Teaspoon -- Honey Cup -- Nectar Teaspoon -- Nectar Cup -- Nectar Straw -- Thin Teaspoon -- Thin Cup -- Thin Straw -- Puree -- Mechanical Soft -- Regular -- Multi-consistency -- Pill --  Cervical Esophageal Comment -- Juan Quam  [facility] 02/09/2021, 3:35 PM               Labs: BMET Recent Labs  Lab 02/05/21 2159 02/06/21 0434 02/07/21 0258 02/08/21 0500 02/09/21 0415 02/10/21 0407 02/11/21 0432  NA 136 139 137 138 139 143 138  K 4.2 4.3 3.7 3.4* 3.5 3.5 4.5  CL 103 105 102 101 103 108 101  CO2 19* 20* 19* _0 GLUCOSE 185* 133* 134* 165* 165* 158* 192*  BUN 72* 72* 32* 28* 46* 58* 31*  CREATININE 6.84* 6.71* 4.22* 4.13* 5.47* 6.26* 4.32*  CALCIUM 9.0 9.0 9.0 9.0 9.1 9.1 8.7*  PHOS 5.8* 6.3* 2.3* 4.2 3.8 3.8 2.7    CBC Recent Labs  Lab 02/07/21 0952 02/10/21 0407  WBC 18.4* 15.6*  HGB 8.7* 8.1*  HCT 26.4* 24.2*  MCV 85.4 84.9  PLT 336 323     Medications:     chlorhexidine gluconate (MEDLINE KIT)  15 mL Mouth Rinse BID   Chlorhexidine Gluconate Cloth  6 each Topical Q0600   feeding supplement (NEPRO CARB STEADY)  1,000 mL Per Tube Q24H   feeding supplement (PROSource TF)  45 mL Per Tube BID   hydrocerin   Topical BID   insulin aspart  0-15 Units Subcutaneous Q4H   insulin detemir  10 Units Subcutaneous Daily   mouth rinse  15 mL Mouth Rinse 10 times per day   nystatin  5 mL Mouth/Throat QID   ondansetron  4 mg Intravenous Q6H   pantoprazole (PROTONIX) IV  40 mg Intravenous Q12H   scopolamine  1 patch Transdermal Q72H   sodium chloride HYPERTONIC  4 mL Nebulization BID     Justin Mend  02/11/2021, 6:39 AM

## 2021-02-11 NOTE — Progress Notes (Addendum)
RT at beside to find pt's SpO2 at 76% on the monitor. At that time, pt was on room air with humidification through trach collar. Pt was moaning pointing to his stomach with labored breathing. Pt was suctioned by RT with small amount of secretions. Pt's inner cannula was also changed with no improvement. RT placed CO2 detector on end of trach with positive color change. RT placed pt on oxygen through trach collar on 98% FiO2 on 10L with improvement of pt's SpO2 at 91% NP at bedside along with RN of pt and RT x2. NP did order ABG. RT x2 attempted and was successful. RT kept pt on FiO2 of 98% and 10L through trach collar. ABG was sent to main lab #[id number] at @ 1220 from tube station #20 (2W.) RT called main lab to notify of ABG coming, main lab acknowledged. RT will continue to monitor pt.

## 2021-02-11 NOTE — Progress Notes (Signed)
Physical Therapy Treatment Patient Details Name: Thomas [name] MRN: [id number] DOB: 05-14-1955 Today's Date: 02/11/2021   History of Present Illness 65 y.o. male admitted 7/24 with high ostomy output. Pt with severe sepsis and multisystem organ failure. Hospital admission complicated by volume overload, aspiration pneumonia, acute respiratory failure, AKI and ischemic colitis. Intubated 7/30. S/p trach placement 8/11. CorTrak 8/12. HD initiated 8/12. To IR 8/15 for tunneled HD cath. Pulled out trach x2 8/22 replaced by PCCM. Pulled out trach again 8/25 but unable to be reinserted with pt intubated. 8/29 to OR for stoma clean up and trach replacement.Trach downsized 9/10.  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    PT Comments    Pt agreed to a limited amount of exercise, but was in fact limited by flank pain and the medical team coming in to assess his condition.    Recommendations for follow up therapy are one component of a multi-disciplinary discharge planning process, led by the attending physician.  Recommendations may be updated based on patient status, additional functional criteria and insurance authorization.  Follow Up Recommendations  SNF     Equipment Recommendations  Wheelchair (measurements PT);Wheelchair cushion (measurements PT);3in1 (PT);Rolling walker with 5" wheels    Recommendations for Other Services       Precautions / Restrictions Precautions Precautions: Fall     Mobility  Bed Mobility                    Transfers                    Ambulation/Gait                 Stairs             Wheelchair Mobility    Modified Rankin (Stroke Patients Only)       Balance                                             Cognition Arousal/Alertness: Awake/alert Behavior During Therapy: Flat affect Overall Cognitive Status: Difficult to assess (NT formally)                                        Exercises Other Exercises Other Exercises: bil bicep/tricep presses with graded resistance x 10 reps Other Exercises: bil hip/knee flex/ext with graded assist/resistance x10 reps    General Comments        Pertinent Vitals/Pain Pain Assessment: Faces Faces Pain Scale: Hurts even more Pain Location: abdomen and flank Pain Descriptors / Indicators: Grimacing;Guarding;Restless Pain Intervention(s): Limited activity within patient's tolerance;Monitored during session    Home Living                      Prior Function            PT Goals (current goals can now be found in the care plan section) Acute Rehab PT Goals PT Goal Formulation: With patient Time For Goal Achievement: 02/16/21 Potential to Achieve Goals: Fair Progress towards PT goals: Not progressing toward goals - comment (pt with medical complications)  Frequency    Min 3X/week      PT Plan Current plan remains appropriate    Co-evaluation              AM-PAC PT "6 Clicks" Mobility   Outcome Measure  Help needed turning from your back to your side while in a flat bed without using bedrails?: A Lot Help needed moving from lying on your back to sitting on the side of a flat bed without using bedrails?: A Lot Help needed moving to and from a bed to a chair (including a wheelchair)?: Total Help needed standing up from a chair using your arms (e.g., wheelchair or bedside chair)?: Total Help needed to walk in hospital room?: Total Help needed climbing 3-5 steps with a railing? : Total 6 Click Score: 8    End of Session Equipment Utilized During Treatment: Oxygen Activity Tolerance: Patient limited by pain;Treatment limited secondary to medical complications (Comment) Patient left: in  bed;with call bell/phone within reach;with family/visitor present;Other (comment) (Doctors in room for [place] and further assessment) Nurse Communication: Mobility status PT Visit Diagnosis: Muscle weakness (generalized) (M62.81);Other abnormalities of gait and mobility (R26.89)     Time: 2353-6144 PT Time Calculation (min) (ACUTE ONLY): 10 min  Charges:  $Therapeutic Exercise: 8-22 mins                     02/11/2021  Ginger Carne., PT Acute Rehabilitation Services [phone]  (pager) [phone]  (office)   Tessie Fass Dontel Harshberger 02/11/2021, 2:27 PM

## 2021-02-11 NOTE — Progress Notes (Signed)
RT at bedside for routine trach care to find RN x2 at bedside. Pt presented with shallow breathing and pt was not responsive, with decrease in SpO2. RT removed pt from trach collar and manually bagged mask pt. Rapid response RN was called along with CCM. Rapid RN and CCM at bedside. Per CCM, replace pt's #6 shiley XLT proximal cuffless to a #6 XLT proximal cuffed trach and pt was placed on the ventilator. RT will continue to monitor pt.

## 2021-02-11 NOTE — Progress Notes (Signed)
CPT held due to tachycardia and agitation. RT will cont to monitor.

## 2021-02-11 NOTE — Procedures (Signed)
Tracheostomy Exchange Procedure Note  Thomas [name]  [id number]  1955/11/17  Date:02/11/21  Time:5:51 PM   Provider Performing:Oneka Parada [place] Payment   Procedure: Tracheostomy Exchange Through Immature Stoma [id number])  Indication(s) Respiratory failure secondary to mucus plugging from mucopurulent  drainage.   Consent Risks of the procedure as well as the alternatives and risks of each were explained to the patient and/or caregiver.  Consent for the procedure was obtained and is signed in the bedside chart  Anesthesia 20 mg of etomidate and subsequently used propofol for sedation for mechanical ventilation.   Time Out Verified patient identification, verified procedure, site/side was marked, verified correct patient position, special equipment/implants available, medications/allergies/relevant history reviewed, required imaging and test results available.   Sterile Technique Hand hygiene, gloves   Procedure Description Size #6 XLT uncuffed existing Shiley removed and size #6 XLT cuffed Shiley placed through stoma.   Complications/Tolerance None; patient tolerated the procedure well..   EBL Minimal  Lawerance Cruel, D.O.  Internal Medicine Resident, PGY-3 [facility]  Pager: [phone] 5:55 PM, 02/11/2021

## 2021-02-11 NOTE — Progress Notes (Signed)
NAME:  Thomas [name], MRN:  [id number], DOB:  12-02-1955, LOS: 23 ADMISSION DATE:  11/30/2020, CONSULTATION DATE:  7/30 REFERRING MD:  Riccardo Dubin, CHIEF COMPLAINT:  Worsening hypoxia/dyspnea  History of Present Illness:  65 yo M admitted 7/24 for worsening sepsis vs reaction to chemotherapy with high ostomy output and soft BP on 7/25. Has had issues during his hospitalization with recurrent aspirations and more recently worsening respiratory status.  Most recently pt developed worsening hypoxia and was requiring BIPAP w/ FIO2 of 100% and increased work of breathing.   He was admitted to intensive care and subsequently had worsening clinical status requiring intubation, CRRT, multiple pressors and pneumonia along with CT findings 8/1 concerning for ischemic bowel.  Significant Hospital Events:  7/27 Transferred to [facility], stopped abx as sepsis resolved/ruled out 7/30 AM Worsened AME, more hypoxic with worsening renal funciton, new aspiration on abx restarted 7/30 PM Pt with increased o2 requirement on 100% FIO2 on BIPAP, d/w family who wanted to proceed with intubation, pt intubated 7/31 Tracheal aspirate grew moderate candida tropicalis  8/1 Some issues with tachycardia and hypotension overnight. Reproted high ostomy output.  GI PCR negative 8/2 down to 9 mcg of Levophed, however repeat CT abdomen/pelvis showed inflammatory wall thickening and fat stranding of the descending and sigmoid colon, new since prior, consistent with nonspecific infectious, inflammatory, or ischemic colitis.  Also new dense consolidative airspace opacity in the right lower and right middle lobes 8/3 further weaning NE. Weaning sedation. Full code, full scope per family meeting 8/2. Eraxis stopped (8/1-8/3) 8/4 off pressors, plt down to 103.  8/5 remains off pressors. Moving around more, not really following commands though  8/9 failed extubation 8/8, tolerated nasal cannula for about an hour, desaturations and inability to  clear secretions-reintubated 8/8 8/10 some agitation overnight 8/11 tracheostomy placement On trach collar since 8/16 8/22 pulled trach out, replaced under bronch guidance. Has proximal area of granulation tissue that is above the bottom of the trach tube.  8/25 Removed Trach, unable to reinsert, despite multiple attempts re-intubated 8/29 Anemia, 1 unit PRBC. ENT re-placed Trach in [place]. 9/8 tracheal aspirate growing pan-sensitive P. Aeruginosa. Started on Ceftazidine  9/19 Continues to have thick secretions.  10/5 had respiratory distress and increase in O2 requirements, frequent suctioning.  Possible plug vs aspiration.  Going for CT chest  Interim History / Subjective:  Had increase in O2 requirements and increased WOB. RT suctioned some small clots. Question aspiration.  No effusion on bedside US but pt very tender on left chest wall.  Objective   Blood pressure 136/71, pulse (!) 113, temperature 98.5 F (36.9 C), temperature source Oral, resp. rate (!) 38, height 5\' 8"  (1.727 m), weight 96.7 kg, SpO2 91 %.    FiO2 (%):  [21 %-98 %] 98 %   Intake/Output Summary (Last 24 hours) at 02/11/2021 1336 Last data filed at 02/11/2021 0648 Gross per 24 hour  Intake 809.79 ml  Output 1626 ml  Net -816.21 ml    Filed Weights   02/10/21 1330 02/10/21 1712 02/11/21 0500  Weight: 99.2 kg 98 kg 96.7 kg     Physical Exam: General: chronically ill appearing man lying in bed in mild distress HEENT: [place]/AT, eyes anicteric Neck: shiley XLT proximal #6, no bleeding or drainage Lungs: shallow and slightly labored.  Coarse bilaterally. CV: S1S2, RRR Abdomen: soft, ND  Extremities: mild pedal edema, no cyanosis Neuro: Awake, alert, anxious Globally weak.  No updated micro results since previous encounter.  Ct abd pelvis 9/29> atelectasis in R base vs less likely infiltrate.   Resolved Hospital Problem list   Shock  HHS Afib RVR  Lactic Acidosis  Thrombocytopenia PsA  tracheobronchitis and colonization Colitis  Assessment & Plan:  Acute hypoxemic respiratory failure s/p tracheostomy Endotracheal granulation tissue-  Trach revision by ENT when removed and unable to be replaced at bedside. ENT changed on 8/29 to cuffless trach. Obstructive sleep apnea -Con't shiley #6 prox XLT. Not ready to downsize yet given secretions -Con't PMV use as tolerated and working with SLP.   -Con't routine trach care -Adding CPT & hypertonic saline given ongoing thick secretions and need for rescue suctioning. I will avoid adding mucinex since we are already trying to dry secretions with multiple anticholinergic meds. Continue robinul, scopolamine patch unless we think these are contributing to thickened secretions, potentially slowing his vent weaning progress. -Con't ATC. -Con't focus on rehabilitation to strengthen respiratory and swallow muscles. -reculture sputum if febrile given atelectasis vs opacity in RLL. -STAT CT chest now to assess for changes given his distress and increased O2 requirements   Dysphagia, ongoing silent aspiration on MBSS.  Now s/p PEG 10/4 Oral thrush, reflux esophagitis likely contributing to dysphagia. 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   Montey Hora, [facility] For pager details, please see AMION or use Epic chat  After 1900, please call [facility] for cross coverage needs 02/11/2021, 1:39 PM

## 2021-02-11 NOTE — Progress Notes (Signed)
RT assisted with transportation of this pt from 2W28 to CT scan, then to 3M3 while on full ventilatory support. Pt tolerated well with SVS and no complications. RN at bedside at this time.

## 2021-02-11 NOTE — Progress Notes (Signed)
Referring Physician(s): Dr Renaldo Harrison  Supervising Physician: Mir, Sharen Heck  Patient Status:  Thomas [name] - In-pt  Chief Complaint:  Dysphagia   Subjective:  Percutaneous G tube placed in IR 10/4   Allergies: Morphine and related  Medications: Prior to Admission medications   Medication Sig Start Date End Date Taking? Authorizing Provider  acetaminophen (TYLENOL) 500 MG tablet Take 1,000 mg by mouth 3 (three) times daily as needed for headache (pain).   Yes [provider]  albuterol (PROVENTIL HFA;VENTOLIN HFA) 108 (90 BASE) MCG/ACT inhaler Inhale 2 puffs into the lungs every 4 (four) hours as needed for wheezing or shortness of breath. 04/09/15  Yes Muthersbaugh, Jarrett Soho, PA-C  amLODipine (NORVASC) 10 MG tablet Take 10 mg by mouth daily. 05/11/20  Yes [provider]  capecitabine (XELODA) 500 MG tablet Take 3 tablets (1,500 mg total) by mouth 2 (two) times daily after a meal. 11/19/20  Yes Ladell Pier, MD  gabapentin (NEURONTIN) 300 MG capsule Take 1 capsule (300 mg total) by mouth 3 (three) times daily. 04/06/16  Yes Varney Biles, MD  levETIRAcetam (KEPPRA) 1000 MG tablet Take 1,000 mg by mouth 2 (two) times daily. 07/28/16  Yes [provider]  metFORMIN (GLUCOPHAGE-XR) 500 MG 24 hr tablet Take 500 mg by mouth 2 (two) times daily. 07/27/20  Yes [provider]  omeprazole (PRILOSEC) 20 MG capsule Take 20 mg by mouth daily. 08/10/16  Yes [provider]  atorvastatin (LIPITOR) 20 MG tablet Take 20 mg by mouth daily at 6 PM.    [provider]  lidocaine-prilocaine (EMLA) cream Apply 1 application topically as directed. Apply 1/2 tablespoon to port site 2 hours prior to stick and cover with plastic wrap to numb site Patient not taking: Reported on 10/08/2020 09/24/20   Ladell Pier, MD  ondansetron (ZOFRAN) 8 MG tablet Take 1 tablet (8 mg total) by mouth every 8 (eight) hours as needed for nausea or vomiting. Patient not taking:  No sig reported 09/24/20   Ladell Pier, MD  prochlorperazine (COMPAZINE) 10 MG tablet Take 1 tablet (10 mg total) by mouth every 6 (six) hours as needed. Patient not taking: No sig reported 09/24/20   Ladell Pier, MD     Vital Signs: BP 126/76   Pulse (!) 106   Temp 97.8 F (36.6 C)   Resp (!) 24   Ht 5\' 8"  (1.727 m)   Wt 213 lb 3 oz (96.7 kg)   SpO2 93%   BMI 32.41 kg/m   Physical Exam Abdominal:     General: Bowel sounds are normal.     Palpations: Abdomen is soft.  Skin:    General: Skin is warm.     Comments: Site clean and dry No bleeding No hematoma    Imaging: IR GASTROSTOMY TUBE MOD SED  Result Date: 02/10/2021 CLINICAL DATA:  Oropharyngeal dysphagia, needs enteral feeding support EXAM: PERC PLACEMENT GASTROSTOMY FLUOROSCOPY TIME:  3 minute 7 seconds; 44 mGy TECHNIQUE: The procedure, risks, benefits, and alternatives were explained to the patient. Questions regarding the procedure were encouraged and answered. The patient understands and consents to the procedure. As antibiotic prophylaxis, cefazolin 2 g was ordered pre-procedure and administered intravenously within one hour of incision. Appropriate percutaneous approach was confirmed on recent CT. A 5 French angiographic catheter was placed as orogastric tube. The upper abdomen was prepped with Betadine, draped in usual sterile fashion, and infiltrated locally with 1% lidocaine. Intravenous Fentanyl 161mcg and Versed 3mg   were administered as conscious sedation during continuous monitoring of the patient's level of consciousness and physiological / cardiorespiratory status by the radiology RN, with a total moderate sedation time of 13 minutes. 0.5 mg glucagon given IV. Stomach was insufflated using air through the orogastric tube. An 73 French sheath needle was advanced percutaneously into the gastric lumen under fluoroscopy. Gas could be aspirated and a small contrast injection confirmed intraluminal spread. The  sheath was exchanged over a guidewire for a 9 [place] vascular sheath, through which the snare device was advanced and used to snare a guidewire passed through the orogastric tube. This was withdrawn, and the snare attached to the 20 French pull-through gastrostomy tube, which was advanced antegrade, positioned with the internal bumper securing the anterior gastric wall to the anterior abdominal wall. Small contrast injection confirms appropriate positioning. The external bumper was applied and the catheter was flushed. COMPLICATIONS: COMPLICATIONS none IMPRESSION: 1. Technically successful 20 French pull-through gastrostomy placement under fluoroscopy. Electronically Signed   By: Lucrezia Europe M.D.   On: 02/10/2021 14:41   DG Swallowing Func-Speech Pathology  Result Date: 02/09/2021 Table formatting from the original result was not included. 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  Dizziness   Dyspnea   pt states d/t weight  Epilepsy ([place])   takes Keppra daily  GERD (gastroesophageal reflux disease)   takes Omeprazole daily  Headache   Hyperlipidemia   takes Atorvastatin daily  Hypertension   takes Lotrel daily  Peripheral edema   takes Lasix daily  Peripheral neuropathy   takes Gabapentin daily  Pneumonia 3 yrs ago  hx of  Seizures ([place])   Sleep apnea   Urinary frequency  Past Surgical History: Past Surgical  History: Procedure Laterality Date  BIOPSY  09/05/2020  Procedure: BIOPSY;  Surgeon: Irene Shipper, MD;  Location: [facility] ENDOSCOPY;  Service: Endoscopy;;  BIOPSY  02/06/2021  Procedure: BIOPSY;  Surgeon: Jerene Bears, MD;  Location: [facility] ENDOSCOPY;  Service: Gastroenterology;;  CARDIAC CATHETERIZATION  7 yrs ago  COLONOSCOPY WITH PROPOFOL N/A 09/05/2020  Procedure: COLONOSCOPY WITH PROPOFOL;  Surgeon: Irene Shipper, MD;  Location: [place];  Service: Endoscopy;  Laterality: N/A;  CRANIOTOMY N/A 09/01/2016  Procedure: CRANIOTOMY TUMOR  LEFT PTERIONAL;  Surgeon: Ashok Pall, MD;  Location: [facility];  Service: Neurosurgery;  Laterality: N/A;  cyst removed from chest     as a child  ESOPHAGOGASTRODUODENOSCOPY (EGD) WITH PROPOFOL N/A 02/06/2021  Procedure: ESOPHAGOGASTRODUODENOSCOPY (EGD) WITH PROPOFOL;  Surgeon: Jerene Bears, MD;  [name];  Service: Gastroenterology;  Laterality: N/A;  IR FLUORO GUIDE CV LINE LEFT  12/22/2020  IR US GUIDE VASC ACCESS LEFT  12/22/2020  NO PAST SURGERIES    PARTIAL COLECTOMY N/A 09/10/2020  Procedure: TRANSVERSE COLECTOMY;  Surgeon: [name], MD;  Location: [place];  Service: General;  Laterality: N/A;  POLYPECTOMY  09/05/2020  Procedure: POLYPECTOMY;  Surgeon: Irene Shipper, MD;  Location: [place];  Service: Endoscopy;;  PORTACATH PLACEMENT Right 10/01/2020  Procedure: INSERTION PORT-A-CATH;  Surgeon: [name], MD;  Location: [place];  Service: General;  Laterality: Right;  SUBMUCOSAL TATTOO INJECTION  09/05/2020  Procedure: SUBMUCOSAL TATTOO INJECTION;  Surgeon: Irene Shipper, MD;  Location: [facility] ENDOSCOPY;  Service: Endoscopy;;  TRACHEOSTOMY TUBE PLACEMENT N/A 01/05/2021  Procedure: TRACHEOSTOMY;  Surgeon: [name], MD;  Location: [place];  Service: ENT;  Laterality: N/A; HPI: Pt is a 65 y/o male admitted on 7/25 for worsening sepsis vs reaction to chemotherapy with high ostomy output.  Pt developed worsening hypoxia and was requiring BIPAP w/ FIO2 of 100% and  increased work of breathing. SLP consulted due to concern for aspiration. BSE 7/28 recommended NPO status and MBS, but this could not completed subsequently due to lethargy. CXR in AM of 7/30 showed some worsening concern of aspiration. ETT 7/30-8/8; tolerated nasal cannula for about an hour; reintubated 8/8-trach 8/11. Pt pulled out trach x2 8/22; replaced by PCCM. Bronch on 8/22 revealed endotracheal granulation tissue at trach site. Trach removed again on 8/25; unable to reinsert despite multiple attenpts; pt reintubated 8/25- trach by ENT on 8/29. BSE 8/22 limited to ice chips due to pt's refusal of other consistencies; rx NPO status with Cortrak.EGD 9/30:grade C reflux esophagitis/non-bleeding duodenal ulcers.  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  Subjective: alert, cortrak removed Assessment / Plan / Recommendation CHL IP CLINICAL IMPRESSIONS 02/09/2021 Clinical Impression   Pt continues to present with a severe oral and pharyngeal dysphagia with improved visibility of the pharyngeal phase during today's study.  There was limited mobility of the tongue, with some elevation of the superior surface, but poor tongue tip elevation and base of tongue retraction. The chair was moved to a reclined position in an effort to allow gravity to assist with bolus transfer.  Five and ten ml boluses of nectar and thin liquid were administered from a syringe into mid-oral cavity.  Pt demonstrated effort to manipulate material; however, liquids generally transitioned passively into pharynx.  Repetitive attempts to swallow were observed - there was severe impairment in the mobility of the larynx, limited pharyngeal squeeze, and incomplete epiglottic inversion over the  larynx.  Liquids filled the valleculae and pyriforms and spilled into the airway before, during, and after efforts to swallow.  Aspiration did not generally elicit a cough response.  Study was discontinued.  MBS was observed in real time by pt's wife and daughter.  We discussed results.  Results were also reviewed with [name].  Etiology of such severe dysphagia is unclear.  Neurological findings would not generally lead to such severe impairment. Pt's voice is improving (PMV in use during study), and his deficits are not consistent with the type of laryngeal trauma associated with oral intubation. There is potential for the inflammation seen on EGD and degree of thrush on tongue to also impact pharynx/larynx (not visible via MBS and not noted per EGD) and interfere with function. For now, recommend continuing NPO except ice chips. Mr. and Mrs. Gover have been amenable to discussing PEG.  SLP will follow for therapeutic exercises if he is able and will explore potential for free water protocol. SLP Visit Diagnosis Dysphagia, oropharyngeal phase (R13.12) Attention  and concentration deficit following -- Frontal lobe and executive function deficit following -- Impact on safety and function Severe aspiration risk;Risk for inadequate nutrition/hydration   CHL IP TREATMENT RECOMMENDATION 02/09/2021 Treatment Recommendations Therapy as outlined in treatment plan below   Prognosis 02/09/2021 Prognosis for Safe Diet Advancement Fair Barriers to Reach Goals Time post onset;Severity of deficits Barriers/Prognosis Comment -- CHL IP DIET RECOMMENDATION 02/09/2021 SLP Diet Recommendations Other (Comment) Liquid Administration via -- Medication Administration Via alternative means Compensations -- Postural Changes --   CHL IP OTHER RECOMMENDATIONS 02/09/2021 Recommended Consults -- Oral Care Recommendations Oral care QID Other Recommendations --   CHL IP FOLLOW UP RECOMMENDATIONS 02/09/2021 Follow up Recommendations Skilled  Nursing facility   [name] IP FREQUENCY AND DURATION 02/09/2021 Speech Therapy Frequency (ACUTE ONLY) min 2x/week Treatment Duration 2 weeks      CHL IP ORAL PHASE 02/09/2021 Oral Phase Impaired Oral - Pudding Teaspoon -- Oral - Pudding Cup -- Oral - Honey Teaspoon -- Oral - Honey Cup -- Oral - Nectar Teaspoon Weak lingual manipulation;Incomplete tongue to palate contact;Reduced posterior propulsion;Right pocketing in lateral sulci;Left pocketing in lateral sulci;Pocketing in anterior sulcus;Lingual/palatal residue;Delayed oral transit;Decreased bolus cohesion Oral - Nectar Cup -- Oral - Nectar Straw -- Oral - Thin Teaspoon Weak lingual manipulation;Incomplete tongue to palate contact;Reduced posterior propulsion;Right pocketing in lateral sulci;Left pocketing in lateral sulci;Pocketing in anterior sulcus;Lingual/palatal residue;Delayed oral transit;Decreased bolus cohesion Oral - Thin Cup -- Oral - Thin Straw -- Oral - Puree NT Oral - Mech Soft -- Oral - Regular NT Oral - Multi-Consistency -- Oral - Pill -- Oral Phase - Comment --  CHL IP PHARYNGEAL PHASE 02/09/2021 Pharyngeal Phase Impaired Pharyngeal- Pudding Teaspoon -- Pharyngeal -- Pharyngeal- Pudding Cup -- Pharyngeal -- Pharyngeal- Honey Teaspoon -- Pharyngeal -- Pharyngeal- Honey Cup -- Pharyngeal -- Pharyngeal- Nectar Teaspoon Delayed swallow initiation-pyriform sinuses;Reduced pharyngeal peristalsis;Reduced epiglottic inversion;Reduced anterior laryngeal mobility;Reduced laryngeal elevation;Reduced airway/laryngeal closure;Reduced tongue base retraction;Penetration/Aspiration before swallow;Penetration/Aspiration during swallow;Penetration/Apiration after swallow;Moderate aspiration;Pharyngeal residue - pyriform;Pharyngeal residue - valleculae Pharyngeal -- Pharyngeal- Nectar Cup -- Pharyngeal -- Pharyngeal- Nectar Straw -- Pharyngeal -- Pharyngeal- Thin Teaspoon Delayed swallow initiation-pyriform sinuses;Reduced pharyngeal peristalsis;Reduced epiglottic  inversion;Reduced anterior laryngeal mobility;Reduced laryngeal elevation;Reduced airway/laryngeal closure;Reduced tongue base retraction;Penetration/Aspiration before swallow;Penetration/Aspiration during swallow;Penetration/Apiration after swallow;Moderate aspiration;Pharyngeal residue - pyriform;Pharyngeal residue - valleculae Pharyngeal Material enters airway, passes BELOW cords without attempt by patient to eject out (silent aspiration);Material enters airway, passes BELOW cords and not ejected out despite cough attempt by patient Pharyngeal- Thin Cup NT Pharyngeal -- Pharyngeal- Thin Straw -- Pharyngeal -- Pharyngeal- Puree NT Pharyngeal -- Pharyngeal- Mechanical Soft -- Pharyngeal -- Pharyngeal- Regular NT Pharyngeal -- Pharyngeal- Multi-consistency -- Pharyngeal -- Pharyngeal- Pill -- Pharyngeal -- Pharyngeal Comment --  CHL IP CERVICAL ESOPHAGEAL PHASE 01/19/2021 Cervical Esophageal Phase (No Data) Pudding Teaspoon -- Pudding Cup -- Honey Teaspoon -- Honey Cup -- Nectar Teaspoon -- Nectar Cup -- Nectar Straw -- Thin Teaspoon -- Thin Cup -- Thin Straw -- Puree -- Mechanical Soft -- Regular -- Multi-consistency -- Pill -- Cervical Esophageal Comment -- [name] 02/09/2021, 3:35 PM              VAS [place] TRANSCRANIAL DOPPLER W BUBBLES  Result Date: 02/09/2021  Transcranial Doppler with Bubble Patient Name:  ERICKSON YAMASHIRO  Date of Exam:   02/08/2021 Medical Rec #: [id number]     Accession #:    [id number] Date of Birth: March 07, 1956     Patient Gender: M Patient Age:   12 years Exam Location:  [facility] Procedure:      VAS [place] TRANSCRANIAL DOPPLER W BUBBLES Referring Phys: PRAMOD SETHI --------------------------------------------------------------------------------  Indications: Stroke. History: DVT found 12/12/20. Comparison Study: No prior study Performing Technologist: Sharion Dove RVS  Examination Guidelines: A complete evaluation includes B-mode imaging, spectral Doppler, color Doppler,  and power Doppler as needed of all accessible portions of each vessel. Bilateral testing is considered an integral part of a complete examination. Limited examinations for reoccurring indications may be performed as noted.  Summary: No HITS at rest or during Valsalva. Negative transcranial Doppler Bubble study with no evidence of right to left intracardiac communication.  A vascular evaluation was performed. The left middle cerebral artery was studied. An IV was inserted into the patient's right central line. Verbal informed consent was obtained.  NEGATIVE TCD BUbble study *See table(s) above for TCD measurements and observations.  Diagnosing physician: Antony Contras MD Electronically signed by Antony Contras MD on 02/09/2021 at 8:24:18 AM.    Final    VAS US CAROTID  Result Date: 02/09/2021 Carotid Arterial Duplex Study Patient Name:  HERNDON GRILL  Date of Exam:   02/08/2021 Medical Rec #: [id number]     Accession #:    [id number] Date of Birth: 05-26-1955     Patient Gender: M Patient Age:   38 years Exam Location:  [facility] Procedure:      VAS US CAROTID Referring Phys: PRAMOD SETHI --------------------------------------------------------------------------------  Indications:       CVA. Risk Factors:      Hypertension, hyperlipidemia, Diabetes, past history of                    smoking. Other Factors:     Atrial fibrillation, DVT, on Eliquis. Obstructive sleep                    apnea. Limitations        Today's exam was limited due to trach collar, central line,                    dialysis catheter, coughing. Comparison Study:  No prior study on file Performing Technologist: Sharion Dove RVS  Examination Guidelines: A complete evaluation includes B-mode imaging, spectral Doppler, color Doppler, and power Doppler as needed of all accessible portions of each vessel. Bilateral testing is considered an integral part of a complete examination. Limited examinations for reoccurring indications may be  performed as noted.  Right Carotid Findings: +----------+--------+--------+--------+------------------+------------------+           PSV cm/sEDV cm/sStenosisPlaque DescriptionComments           +----------+--------+--------+--------+------------------+------------------+ CCA Prox  65      20                                intimal thickening +----------+--------+--------+--------+------------------+------------------+ CCA Distal49      19                                intimal thickening +----------+--------+--------+--------+------------------+------------------+ ICA Prox  61      27                                                   +----------+--------+--------+--------+------------------+------------------+ ICA  Distal88      43                                                   +----------+--------+--------+--------+------------------+------------------+ ECA       66      11                                                   +----------+--------+--------+--------+------------------+------------------+ +----------+--------+-------+------------+-------------------+           PSV cm/sEDV cmsDescribe    Arm Pressure (mmHG) +----------+--------+-------+------------+-------------------+ Subclavian               Not assessed                    +----------+--------+-------+------------+-------------------+ +---------+--------+--------+------------+ VertebralPSV cm/sEDV cm/sNot assessed +---------+--------+--------+------------+  Left Carotid Findings: +----------+--------+--------+--------+------------------+------------------+           PSV cm/sEDV cm/sStenosisPlaque DescriptionComments           +----------+--------+--------+--------+------------------+------------------+ CCA Prox  58      21                                intimal thickening +----------+--------+--------+--------+------------------+------------------+ CCA Distal75      21                                 intimal thickening +----------+--------+--------+--------+------------------+------------------+ ICA Prox  73      22              heterogenous                         +----------+--------+--------+--------+------------------+------------------+ ICA Distal73      30                                                   +----------+--------+--------+--------+------------------+------------------+ ECA       14                                                           +----------+--------+--------+--------+------------------+------------------+ +----------+--------+--------+------------+-------------------+           PSV cm/sEDV cm/sDescribe    Arm Pressure (mmHG) +----------+--------+--------+------------+-------------------+ Subclavian                Not assessed                    +----------+--------+--------+------------+-------------------+ +---------+--------+--+--------+--+---------+ VertebralPSV cm/s63EDV cm/s22Antegrade +---------+--------+--+--------+--+---------+   Summary: Right Carotid: The extracranial vessels were near-normal with only minimal wall                thickening or plaque. Left Carotid: The extracranial vessels were near-normal with only minimal wall  thickening or plaque. Vertebrals:  Left vertebral artery demonstrates antegrade flow. Right vertebral              artery was not visualized. Subclavians: Bilateral subclavian arteries were not visualized. *See table(s) above for measurements and observations.  Electronically signed by Antony Contras MD on 02/09/2021 at 8:20:41 AM.    Final    VAS [place] TRANSCRANIAL DOPPLER  Result Date: 02/09/2021  Transcranial Doppler Patient Name:  ISIAIH HOLLENBACH  Date of Exam:   02/08/2021 Medical Rec #: [id number]     Accession #:    [id number] Date of Birth: Feb 23, 1956     Patient Gender: M Patient Age:   21 years Exam Location:  [name] Procedure:      VAS [place] TRANSCRANIAL  DOPPLER Referring Phys: PRAMOD SETHI --------------------------------------------------------------------------------  Indications: Stroke. History: DVT found 8/22. Limitations: trach Comparison Study: No prior study Performing Technologist: Sharion Dove RVS  Examination Guidelines: A complete evaluation includes B-mode imaging, spectral Doppler, color Doppler, and power Doppler as needed of all accessible portions of each vessel. Bilateral testing is considered an integral part of a complete examination. Limited examinations for reoccurring indications may be performed as noted.  +----------+-------------+----------+-----------+------------------+ RIGHT TCD Right VM (cm)Depth (cm)Pulsatility     Comment       +----------+-------------+----------+-----------+------------------+ MCA           81.00                 0.92                       +----------+-------------+----------+-----------+------------------+ ACA                                         unable to insonate +----------+-------------+----------+-----------+------------------+ Term ICA      55.00                 1.08                       +----------+-------------+----------+-----------+------------------+ PCA           21.00                 0.83                       +----------+-------------+----------+-----------+------------------+ Opthalmic     17.00                 1.57                       +----------+-------------+----------+-----------+------------------+ ICA siphon                                  unable to insonate +----------+-------------+----------+-----------+------------------+ Vertebral                                         trach        +----------+-------------+----------+-----------+------------------+ Distal ICA                                        trach        +----------+-------------+----------+-----------+------------------+   +----------+------------+----------+-----------+-------+  LEFT TCD  Left VM (cm)Depth (cm)PulsatilityComment +----------+------------+----------+-----------+-------+ MCA          95.00                 1.17            +----------+------------+----------+-----------+-------+ ACA          -35.00                1.17            +----------+------------+----------+-----------+-------+ Term ICA     101.00                1.18            +----------+------------+----------+-----------+-------+ PCA          13.00                 0.94            +----------+------------+----------+-----------+-------+ Opthalmic    34.00                  1.5            +----------+------------+----------+-----------+-------+ ICA siphon   41.00                 0.81            +----------+------------+----------+-----------+-------+ Vertebral                                   trach  +----------+------------+----------+-----------+-------+ Distal ICA                                  trach  +----------+------------+----------+-----------+-------+  +------------+-----+-------+             VM cmComment +------------+-----+-------+ Prox Basilar      trach  +------------+-----+-------+ Summary:  Poor suboccipital window and poor temporal windows limit evaluation.Mildly elevated mean flow velocities in terminal left internal carotid, and bilateral middle cerebral arteries indicates mild stenosis in these vessels. *See table(s) above for TCD measurements and observations.  Diagnosing physician: Antony Contras MD Electronically signed by Antony Contras MD on 02/09/2021 at 8:23:43 AM.    Final     Labs:  CBC: Recent Labs    01/31/21 0814 02/03/21 0647 02/07/21 0952 02/10/21 0407  WBC 19.1* 18.7* 18.4* 15.6*  HGB 8.5* 8.5* 8.7* 8.1*  HCT 25.5* 25.4* 26.4* 24.2*  PLT 324 381 336 323    COAGS: Recent Labs    11/30/20 2205 12/08/20 0405 12/09/20 0916 12/10/20 0412 01/02/21 0604  01/02/21 1411 01/03/21 0129 01/03/21 1015 02/10/21 0407  INR 1.5*  --  1.7*  --   --   --   --   --  1.1  APTT 22*   < >  --    < > 76* 86* 149* 110*  --    < > = values in this interval not displayed.    BMP: Recent Labs    02/08/21 0500 02/09/21 0415 02/10/21 0407 02/11/21 0432  NA 138 139 143 138  K 3.4* 3.5 3.5 4.5  CL 101 103 108 101  CO2 24 22 23 24   GLUCOSE 165* 165* 158* 192*  BUN 28* 46* 58* 31*  CALCIUM 9.0 9.1 9.1 8.7*  CREATININE 4.13* 5.47* 6.26* 4.32*  GFRNONAA 15* 11* 9* 15*    LIVER FUNCTION TESTS: Recent Labs  01/17/21 0845 01/18/21 0352 01/19/21 0230 01/20/21 0516 01/29/21 1233 01/30/21 1959 02/08/21 0500 02/09/21 0415 02/10/21 0407 02/11/21 0432  BILITOT 0.8 0.9 0.7  --  0.6  --   --   --   --   --   AST 12* 16 14*  --  25  --   --   --   --   --   ALT 16 16 15   --  31  --   --   --   --   --   ALKPHOS 103 117 109  --  116  --   --   --   --   --   PROT 8.2* 8.3* 8.1  --  7.9  --   --   --   --   --   ALBUMIN 2.7* 2.8* 2.8*  2.8*   < > 2.7*   < > 2.9* 2.8* 2.7* 2.8*   < > = values in this interval not displayed.    Assessment and Plan:  May use G tube now  Electronically Signed: Lavonia Drafts, PA-C 02/11/2021, 6:50 AM   I spent a total of 15 Minutes at the the patient's bedside AND on the patient's [name] floor or unit, greater than 50% of which was counseling/coordinating care for G tube

## 2021-02-11 NOTE — Progress Notes (Signed)
Pt pulled trach out . RT and Rapid response called to pt Rm. New trach same size 6 XLT proximal was placed. CO2 detector used and showed yellow color change. Pt VS stable throughout trach placement.

## 2021-02-11 NOTE — Progress Notes (Signed)
Patient was given Geodon 20 mg at 1410 in preparation for his CT scan,  RN re-evaluated the patient at 1515. Patient was still agitated.  Patient was prepared for CT scan and 100 mg fentanyl given at 1529.  SWOT RN was to accompany the patient to CT.  RN was called back to the room by the SWOT RN. Patient had desaturated into the 81 and his HR was 141.  RN was preparing to assist ventilations with an ambu bag when Respiratory Therapist arrived and assumed that responsibility, MD and Rapid response called.  MD changed trach tube to a cuffed tube after Etomidate administered. Patient placed on ventilator, Propofol begun and patient taken to CT then to room 3M03.

## 2021-02-11 NOTE — Procedures (Signed)
Tracheostomy Change Note  Patient Details:   Name: Thomas [name] DOB: 07/31/1955 MRN: [id number]    Airway Documentation:     Evaluation  O2 sats: stable throughout Complications: No apparent complications Patient did tolerate procedure well. Bilateral Breath Sounds: Rhonchi, Diminished   Pt became unresponsive w/ SpO2 in 60's. Pt did have #6 XLT proximal cuffless placed. CCM at bedside requesting to place pt on mechanical ventilation. CCM removed cuffless trach and placed #6 XLT proximal cuffed.  Jorje Guild 02/11/2021, 5:36 PM

## 2021-02-11 NOTE — Progress Notes (Signed)
Pharmacy Antibiotic Note  Thomas [name] is a 65 y.o. male admitted on 11/30/2020 with complex medical history and extended hospital stay for AKI on CKD, sepsis and now concern for pneumonia.  Pharmacy has been consulted to broad ABX to vancomycinn and cefepime.  Plan: Cefepime 2g x1 now then qHD TTS Vancomycin 2000mg  IV x1 now then 1000mg  qHD TTS  Height: 5\' 8"  (172.7 cm) Weight: 96.7 kg (213 lb 3 oz) IBW/kg (Calculated) : 68.4  Temp (24hrs), Avg:97.9 F (36.6 C), Min:97.4 F (36.3 C), Max:98.5 F (36.9 C)  Recent Labs  Lab 02/07/21 0258 02/07/21 0952 02/08/21 0500 02/09/21 0415 02/10/21 0407 02/11/21 0432  WBC  --  18.4*  --   --  15.6*  --   CREATININE 4.22*  --  4.13* 5.47* 6.26* 4.32*     Estimated Creatinine Clearance: 19.5 mL/min (A) (by C-G formula based on SCr of 4.32 mg/dL (H)).    Allergies  Allergen Reactions   Morphine And Related Other (See Comments)    Throat swelling    Antimicrobials this admission: Fluconazole 9/09 > 9/15, 9/22 >9/27 Ceftazidime 9/08 > 9/18 Nystatin oral 9/09 > 9/22, 9/26 >> Cefepime 7/24 > 7/28, 7/31 > 8/07   Vanc x1 7/25, 8/01 > 8/02; 10/5 >> Unasyn 7/30 > 7/31; 9/19 > 9/22 Flagyl 7/31 > 8/07 Eraxis 8/01 > 8/02 Cefazolin 8/15 > 8/16 Ceftriaxone 8/16 > 8/17; x 1 on 9/07 Cefepime 8/17 > 8/22; 10/5 >>  Microbiology results: 7/31 Trach: candida tropicalis (likely not pathogenic) 8/15 TA: abundant GPCs, Pseudomonas 9/07 TA: moderate Pseudomonas 9/07 UCx: multiple species 9/19 UCx (cath): yeast   Thank you for allowing pharmacy to be a part of this patient's care.  Arrie Senate, PharmD, BCPS, [facility] Clinical Pharmacist [phone] Please check AMION for all [place] numbers 02/11/2021

## 2021-02-11 NOTE — Significant Event (Addendum)
Rapid Response Event Note   Reason for Call :  Pt with acute oxygen desaturation to 60%- pt noted to be lethargic with shallow breathing noted. RT manually bagging pt upon my arrival.   Interventions:  PCCM notified. Trach exchanged for cuffed trach. Pt placed on ventilator support.   Plan of Care:  Transferred to ICU  Event Summary:  MD Notified: Dr. Tamala Julian Call Time: [place] Time: 6681 End Time: [place], RN

## 2021-02-12 ENCOUNTER — Inpatient Hospital Stay (HOSPITAL_COMMUNITY): Payer: Medicare HMO

## 2021-02-12 DIAGNOSIS — E1100 Type 2 diabetes mellitus with hyperosmolarity without nonketotic hyperglycemic-hyperosmolar coma (NKHHC): Secondary | ICD-10-CM | POA: Diagnosis not present

## 2021-02-12 LAB — BODY FLUID CELL COUNT WITH DIFFERENTIAL
Eos, Fluid: 0 %
Lymphs, Fluid: 1 %
Monocyte-Macrophage-Serous Fluid: 1 % — ABNORMAL LOW (ref 50–90)
Neutrophil Count, Fluid: 98 % — ABNORMAL HIGH (ref 0–25)
Total Nucleated Cell Count, Fluid: 1870 cu mm — ABNORMAL HIGH (ref 0–1000)

## 2021-02-12 LAB — RENAL FUNCTION PANEL
Albumin: 2.4 g/dL — ABNORMAL LOW (ref 3.5–5.0)
Anion gap: 17 — ABNORMAL HIGH (ref 5–15)
BUN: 45 mg/dL — ABNORMAL HIGH (ref 8–23)
CO2: 21 mmol/L — ABNORMAL LOW (ref 22–32)
Calcium: 8.9 mg/dL (ref 8.9–10.3)
Chloride: 102 mmol/L (ref 98–111)
Creatinine, Ser: 5.88 mg/dL — ABNORMAL HIGH (ref 0.61–1.24)
GFR, Estimated: 10 mL/min — ABNORMAL LOW (ref >60)
Glucose, Bld: 232 mg/dL — ABNORMAL HIGH (ref 70–99)
Phosphorus: 5 mg/dL — ABNORMAL HIGH (ref 2.5–4.6)
Potassium: 4.6 mmol/L (ref 3.5–5.1)
Sodium: 140 mmol/L (ref 135–145)

## 2021-02-12 LAB — POCT I-STAT 7, (LYTES, BLD GAS, ICA,H+H)
Acid-base deficit: 3 mmol/L — ABNORMAL HIGH (ref 0.0–2.0)
Bicarbonate: 21.8 mmol/L (ref 20.0–28.0)
Calcium, Ion: 1.23 mmol/L (ref 1.15–1.40)
HCT: 26 % — ABNORMAL LOW (ref 39.0–52.0)
Hemoglobin: 8.8 g/dL — ABNORMAL LOW (ref 13.0–17.0)
O2 Saturation: 99 %
Patient temperature: 98.8
Potassium: 4.5 mmol/L (ref 3.5–5.1)
Sodium: 144 mmol/L (ref 135–145)
TCO2: 23 mmol/L (ref 22–32)
pCO2 arterial: 36.8 mmHg (ref 32.0–48.0)
pH, Arterial: 7.381 (ref 7.350–7.450)
pO2, Arterial: 123 mmHg — ABNORMAL HIGH (ref 83.0–108.0)

## 2021-02-12 LAB — CBC
HCT: 26.7 % — ABNORMAL LOW (ref 39.0–52.0)
Hemoglobin: 8.8 g/dL — ABNORMAL LOW (ref 13.0–17.0)
MCH: 28.8 pg (ref 26.0–34.0)
MCHC: 33 g/dL (ref 30.0–36.0)
MCV: 87.3 fL (ref 80.0–100.0)
Platelets: 253 10*3/uL (ref 150–400)
RBC: 3.06 MIL/uL — ABNORMAL LOW (ref 4.22–5.81)
RDW: 15.3 % (ref 11.5–15.5)
WBC: 27 10*3/uL — ABNORMAL HIGH (ref 4.0–10.5)
nRBC: 0.1 % (ref 0.0–0.2)

## 2021-02-12 LAB — GLUCOSE, CAPILLARY
Glucose-Capillary: 122 mg/dL — ABNORMAL HIGH (ref 70–99)
Glucose-Capillary: 151 mg/dL — ABNORMAL HIGH (ref 70–99)
Glucose-Capillary: 160 mg/dL — ABNORMAL HIGH (ref 70–99)
Glucose-Capillary: 165 mg/dL — ABNORMAL HIGH (ref 70–99)
Glucose-Capillary: 167 mg/dL — ABNORMAL HIGH (ref 70–99)
Glucose-Capillary: 197 mg/dL — ABNORMAL HIGH (ref 70–99)

## 2021-02-12 LAB — PROCALCITONIN: Procalcitonin: 4.56 ng/mL

## 2021-02-12 MED ORDER — FENTANYL CITRATE (PF) 100 MCG/2ML IJ SOLN
50.0000 ug | INTRAMUSCULAR | Status: DC | PRN
  Administered 2021-02-12 – 2021-02-15 (×6): 100 ug via INTRAVENOUS
  Filled 2021-02-12: qty 2
  Filled 2021-02-12: qty 1
  Filled 2021-02-12 (×5): qty 2

## 2021-02-12 MED ORDER — CLONAZEPAM 0.5 MG PO TABS
1.0000 mg | ORAL_TABLET | Freq: Three times a day (TID) | ORAL | Status: DC | PRN
  Administered 2021-02-12 – 2021-02-24 (×6): 1 mg via ORAL
  Filled 2021-02-12: qty 1
  Filled 2021-02-12 (×4): qty 2
  Filled 2021-02-12: qty 1

## 2021-02-12 MED ORDER — PIPERACILLIN-TAZOBACTAM IN DEX 2-0.25 GM/50ML IV SOLN
2.2500 g | Freq: Three times a day (TID) | INTRAVENOUS | Status: AC
Start: 2021-02-12 — End: 2021-02-15
  Administered 2021-02-12 – 2021-02-15 (×10): 2.25 g via INTRAVENOUS
  Filled 2021-02-12 (×10): qty 50

## 2021-02-12 MED ORDER — LORAZEPAM 2 MG/ML IJ SOLN
2.0000 mg | Freq: Once | INTRAMUSCULAR | Status: AC
  Administered 2021-02-12: 2 mg via INTRAVENOUS

## 2021-02-12 MED ORDER — FENTANYL CITRATE (PF) 100 MCG/2ML IJ SOLN
INTRAMUSCULAR | Status: AC
  Filled 2021-02-12: qty 2

## 2021-02-12 MED ORDER — ALTEPLASE 2 MG IJ SOLR
INTRAMUSCULAR | Status: AC
  Administered 2021-02-12: 2 mg
  Filled 2021-02-12: qty 4

## 2021-02-12 MED ORDER — ONDANSETRON HCL 4 MG/2ML IJ SOLN
4.0000 mg | Freq: Four times a day (QID) | INTRAMUSCULAR | Status: DC | PRN
  Administered 2021-02-17 – 2021-02-28 (×3): 4 mg via INTRAVENOUS
  Filled 2021-02-12 (×4): qty 2

## 2021-02-12 MED ORDER — NEPRO/CARBSTEADY PO LIQD
1000.0000 mL | ORAL | Status: DC
  Administered 2021-02-12 – 2021-02-23 (×8): 1000 mL
  Filled 2021-02-12 (×18): qty 1000

## 2021-02-12 MED ORDER — CLONAZEPAM 0.5 MG PO TABS
1.0000 mg | ORAL_TABLET | Freq: Three times a day (TID) | ORAL | Status: DC
  Administered 2021-02-12 – 2021-02-24 (×36): 1 mg
  Filled 2021-02-12: qty 2
  Filled 2021-02-12: qty 1
  Filled 2021-02-12 (×6): qty 2
  Filled 2021-02-12 (×2): qty 1
  Filled 2021-02-12: qty 2
  Filled 2021-02-12 (×3): qty 1
  Filled 2021-02-12: qty 2
  Filled 2021-02-12: qty 1
  Filled 2021-02-12 (×9): qty 2
  Filled 2021-02-12: qty 1
  Filled 2021-02-12: qty 2
  Filled 2021-02-12: qty 1
  Filled 2021-02-12 (×2): qty 2
  Filled 2021-02-12: qty 1
  Filled 2021-02-12 (×3): qty 2
  Filled 2021-02-12: qty 1
  Filled 2021-02-12: qty 2
  Filled 2021-02-12: qty 1
  Filled 2021-02-12: qty 2

## 2021-02-12 MED ORDER — LORAZEPAM 2 MG/ML IJ SOLN
INTRAMUSCULAR | Status: AC
  Filled 2021-02-12: qty 1

## 2021-02-12 MED ORDER — FENTANYL CITRATE (PF) 100 MCG/2ML IJ SOLN: 100.0000 ug | Freq: Once | INTRAMUSCULAR | Status: DC

## 2021-02-12 NOTE — Progress Notes (Signed)
PT Cancellation Note  Patient Details Name: JAMEAR CARBONNEAU MRN: [id number] DOB: [birth year]   Cancelled Treatment:    Reason Eval/Treat Not Completed: Patient not medically ready (pt with medical decline and vent currently sedated)   Styles Fambro B Cedrica Brune 02/12/2021, 7:31 AM Bayard Males, PT Acute Rehabilitation Services Pager: [phone] Office: [phone]

## 2021-02-12 NOTE — Progress Notes (Addendum)
Late entry: Patient was seen and examined on the afternoon of 02/11/2021 after he developed worsening respiratory distress.  Chest x-ray showed significant widening of the left side of the chest.  PCCM was consulted.  Due to worsening respiratory failure, patient was transferred to ICU and PCCM assumed care as primary attending.  Hospitalist signed off.  We will be happy to take over patient back when he is a stable for the floor.  Please notify us.  Thank you.

## 2021-02-12 NOTE — Progress Notes (Signed)
CSW spoke with Anderson Malta at [facility]	[place] who states there are no HD beds available.  CSW spoke with Raquel Sarna at [facility] who will review patient and notify CSW if a bed offer can be made.  Madilyn Fireman, MSW, LCSW Transitions of Care  Clinical Social Worker II [phone]

## 2021-02-12 NOTE — Progress Notes (Signed)
OT Cancellation Note  Patient Details Name: Thomas [name] MRN: [id number] DOB: [birth year]   Cancelled Treatment:    Reason Eval/Treat Not Completed: Medical issues which prohibited therapy (Medical decline/Transferred to ICU/on vent. Will follow up later time)  [place], OT/L   Acute OT Clinical Specialist Pablo Pena Pager [phone] Office [phone]  02/12/2021, 6:47 AM

## 2021-02-12 NOTE — Progress Notes (Signed)
Nutrition Follow-up  DOCUMENTATION CODES:   Non-severe (moderate) malnutrition in context of chronic illness, Obesity unspecified  INTERVENTION:   Tube feeding via PEG tube: - Nepro @ 55 ml/hr (1320 ml/day) - ProSource TF 45 ml BID   Tube feeding regimen provides 2456 kcal, 129 grams of protein, and 950 ml of H2O.   NUTRITION DIAGNOSIS:   Moderate Malnutrition related to chronic illness, cancer and cancer related treatments as evidenced by mild fat depletion, mild muscle depletion, percent weight loss.  Ongoing, being addressed via TF  GOAL:   Patient will meet greater than or equal to 90% of their needs  Met via TF  MONITOR:   Diet advancement, Labs, Weight trends, TF tolerance, I & O's, Skin  REASON FOR ASSESSMENT:   Consult Enteral/tube feeding initiation and management  ASSESSMENT:   65 y.o. male who is a former with medical history of stage 3 colon cancer s/p partial colectomy and end colostomy on 5/4, recent chemo start with 3rd cycle on 7/22, sleep apnea, seizures, epilepsy, brain tumor, HTN, HLD, GERD, asthma, peripheral neuropathy, urinary frequency, DM, MCA stroke. He presented to the ED via EMS due to abdominal pain x2 weeks, anorexia, and weakness. Admitted for close monitoring for concern of mesenteric ischemia. Patient reported that abdominal pain was worse with eating PTA.  07/31 - CRRT initiated 08/09 - CRRT stopped  08/10 - transfer to [facility] for trach placement and iHD 08/11 - s/p trach 08/12 - Cortrak placed (tip of tube in stomach) 08/15 - s/p placement of [facility] 08/22 - pulled trach out, replaced under bronch guidance, s/p bronch revealing mild area of granulation tissue of top of trach tube 08/25 - pulled trach out, transferred to ICU, emergently intubated 08/29 - trach replaced with ENT in OR 09/04 - tx out of ICU 09/09 - changed to cuffless trach 09/22 - diet advanced to clears 09/23 - made NPO  09/27 - TF held 2/2 emesis 09/30 - s/p EGD which  revealed grade LA C reflux esophagitis/non-bleeding duodenal ulcers 10/03 - Cortrak removed, pt failed MBSS 10/04 - s/p PEG placement 10/05 - pt placed on vent support, transfer to ICU, s/p bronch showing significant mucus plugging 10/06 - back on trach collar  Discussed pt with RN and during ICU rounds. Pt back on trach collar this morning. Pt continues to receive iHD with next treatment planned for today.  Pt with Jevity 1.5 infusing at 55 ml/hr via PEG tube. Pt also with orders for Nepro to infuse at 55 ml/hr. Clarified ordered with NP and with RN. Tube feeding orders will be changed to pt's previous tube feeding regimen of Nepro @ 55 ml/hr via PEG with ProSource TF 45 ml BID. RN aware of plan.  Spoke with pt and wife at bedside. Pt reports mild pain at PEG tube site, worse when he has hiccups. He denies any bloating or other GI issues.  Admit weight: 111.8 kg Current weight: 98.1 kg  Medications reviewed and include: colace, pepcid, SSI q 4 hours, levemir 10 units daily, IV zofran, protonix, miralax, IV abx  Labs reviewed: phosphorus 5.0, hemoglobin 8.8 CBG's: 96-197 x 24 hours  Colostomy: 500 ml x 24 hours I/O's: -28.0 L since admit  Diet Order:   Diet Order             Diet NPO time specified Except for: Ice Chips  Diet effective now                   EDUCATION  NEEDS:   No education needs have been identified at this time  Skin:  Skin Assessment: Skin Integrity Issues: Stage II: coccyx  Last BM:  02/12/21 colostomy  Height:   Ht Readings from Last 1 Encounters:  01/01/21 _0  (1.727 m)    Weight:   Wt Readings from Last 1 Encounters:  02/12/21 98.1 kg    Ideal Body Weight:  70 kg  BMI:  Body mass index is 32.88 kg/m.  Estimated Nutritional Needs:   Kcal:  2300-2500  Protein:  120-140 gm  Fluid:  1 L + UOP    Gustavus Bryant, MS, RD, LDN Inpatient Clinical Dietitian Please see AMiON for contact information.

## 2021-02-12 NOTE — Progress Notes (Signed)
NAME:  Thomas [name], MRN:  [id number], DOB:  March 25, 1956, LOS: 60 ADMISSION DATE:  11/30/2020, CONSULTATION DATE:  12/06/2020 REFERRING MD:  Riccardo Dubin, CHIEF COMPLAINT:  Dyspnea   Brief History   Thomas [name] is a 65 y.o. with a pertinent PMH of CVA, colon cancer s/p colectomy/colostomy, T2DM, ESRD (on HD), Epilepsy, HTN, HLD, OSA, DVT (lovenox), who presented in August  after recent surgery for colon cancer s/p resection with signs and symptoms of sepsis and worsening respiratory distress  admitted for aspiration pneumonia. Hospital course complicated by ischemic colitis and prolonged respiratory failure requiring tracheostomy and recently developed mucus plug requiring transfer to the ICU for MV.  Past Medical History  CVA (Lt MCA) Asthma T2DM Epilepsy  HTN HLD OSA Brain tumor s/p craniotomy   Significant Hospital Events   7/27 Transferred to [facility], stopped abx as sepsis resolved/ruled out 7/30 AM Worsened AME, more hypoxic with worsening renal funciton, new aspiration on abx restarted 7/30 PM Pt with increased o2 requirement on 100% FIO2 on BIPAP, d/w family who wanted to proceed with intubation, pt intubated 7/31 Tracheal aspirate grew moderate candida tropicalis  8/1 Some issues with tachycardia and hypotension overnight. Reproted high ostomy output.  GI PCR negative 8/2 down to 9 mcg of Levophed, however repeat CT abdomen/pelvis showed inflammatory wall thickening and fat stranding of the descending and sigmoid colon, new since prior, consistent with nonspecific infectious, inflammatory, or ischemic colitis.  Also new dense consolidative airspace opacity in the right lower and right middle lobes 8/3 further weaning NE. Weaning sedation. Full code, full scope per family meeting 8/2. Eraxis stopped (8/1-8/3) 8/4 off pressors, plt down to 103.  8/5 remains off pressors. Moving around more, not really following commands though  8/9 failed extubation 8/8, tolerated nasal cannula for about an  hour, desaturations and inability to clear secretions-reintubated 8/8 8/10 some agitation overnight 8/11 tracheostomy placement On trach collar since 8/16 8/22 pulled trach out, replaced under bronch guidance. Has proximal area of granulation tissue that is above the bottom of the trach tube.  8/25 Removed Trach, unable to reinsert, despite multiple attempts re-intubated 8/29 Anemia, 1 unit PRBC. ENT re-placed Trach in [place]. 9/8 tracheal aspirate growing pan-sensitive P. Aeruginosa. Started on Ceftazidine  9/19 Continues to have thick secretions.  10/5 had respiratory distress and increase in O2 requirements, frequent suctioning. Ct chest showed large mucus plug in the Lft upper/lower lobes, transferred to the ICU for mechanical ventilation. 10/6 wean sedation and SBT via trach collar  Consults:  none  Procedures:  10/5 - Trach cuff exchange and Bronchoscopy   Significant Diagnostic Tests:  CT CHEST WO CONTRAST Result Date: 02/11/2021 FINDINGS: Cardiovascular: Atherosclerosis of thoracic aorta is noted without aneurysm formation. Normal cardiac size. No pericardial effusion. Coronary artery calcifications are noted. Mediastinum/Nodes: Tracheostomy tube is in grossly good position. The esophagus is unremarkable. No adenopathy is noted. Thyroid gland is unremarkable. Lungs/Pleura: No pneumothorax is noted. No pleural effusion is noted. Left lower lobe airspace opacity is noted with air bronchograms concerning for pneumonia. Abnormal soft tissue density is noted in the left upper and lower lobe bronchi concerning for aspiration or less likely mucous plugging. Minimal right posterior basilar subsegmental atelectasis is noted. Upper Abdomen: Gastrostomy tube is in grossly good position. Musculoskeletal: No chest wall mass or suspicious bone lesions identified.  IMPRESSION: Left lower lobe airspace opacity is noted with air bronchograms consistent with pneumonia. Abnormal soft tissue density is seen  occluding the left upper and lower lobe  bronchi most consistent with aspiration or less likely mucous plugging. Coronary artery calcifications are noted. Aortic Atherosclerosis (   DG CHEST PORT 1 VIEW Result Date: 02/11/2021 FINDINGS: The heart size and mediastinal contours are within normal limits. Tracheostomy tube is unchanged in position. Left internal jugular dialysis catheter is in good position. Feeding tube has been removed. Right internal jugular Port-A-Cath is again noted. Right lung is clear. Increased diffuse left lung opacity is noted concerning for worsening pneumonia or atelectasis with associated left pleural effusion. The visualized skeletal structures are unremarkable.  IMPRESSION: Increased diffuse left lung opacity is noted concerning for worsening pneumonia or atelectasis with possible small left pleural effusion.     DG CHEST PORT 1 VIEW Result Date: 02/11/2021  Micro Data:  10/5 BAL - few GPR/Rare GPC, culture pending  Antimicrobials:  10/4 Cefazolin 10/5 Van> 10/5 Zosyn>    Interim History/Subjective:  Patient resting in bed on mechanical ventilation.  He was able to follow commands but noncommunicative.  Weaning sedation and will assess function  Objective:  Blood pressure 107/70, pulse 93, temperature 98.8 F (37.1 C), temperature source Axillary, resp. rate 16, height _0  (1.727 m), weight 96.7 kg, SpO2 100 %.    Vent Mode: PRVC FiO2 (%):  [21 %-100 %] 100 % Set Rate:  [16 bmp] 16 bmp Vt Set:  [540 mL] 540 mL PEEP:  [8 cmH20] 8 cmH20 Plateau Pressure:  [13 cmH20-30 cmH20] 13 cmH20   Intake/Output Summary (Last 24 hours) at 02/12/2021 0548 Last data filed at 02/11/2021 1900 Gross per 24 hour  Intake 905.59 ml  Output --  Net 905.59 ml   Filed Weights   02/10/21 1330 02/10/21 1712 02/11/21 0500  Weight: 99.2 kg 98 kg 96.7 kg    Examination: General: Alert HENT: Trickey ostomy in place with mechanical ventilation Lungs: Clear to auscultation  bilaterally Cardiovascular: Early rate and rhythm no murmurs rubs or gallops Abdomen: Soft nondistended Extremities: Warm dry normal pulses Neuro: No focal neurologic deficits GU: n/a  Resolved Hospital Problem list     Assessment & Plan:  JANTZEN PILGER is a 65 y.o. with a pertinent PMH of CVA, colon cancer s/p colectomy/colostomy, T2DM, ESRD (on HD), Epilepsy, HTN, HLD, OSA, DVT (lovenox), who presented in August  after recent surgery for colon cancer s/p resection with signs and symptoms of sepsis and worsening respiratory distress  admitted for aspiration pneumonia. Hospital course complicated by ischemic colitis and prolonged respiratory failure requiring tracheostomy and recently developed mucus plug requiring transfer to the ICU for MV.  Acute hypoxemic respiratory failure s/p tracheostomy Endotracheal granulation tissue-  Trach revision by ENT when removed and unable to be replaced at bedside. ENT changed on 8/29 to cuffless trach. Obstructive sleep apnea Aspiration with mucus plug Patient developed acute worsening of respiratory status yesterday afternoon with CT scan showing mucous plug of the left lower and upper lobes.  Lurline Idol was exchanged for a cuffed trach bronchoscopy was subsequently performed resolution of mucous plug.  BAL was sent he was started on broad-spectrum antibiotics.  Today is day 2 of vancomycin and Zosyn.  -Continue [place]iley #6 XLT cuffed trach, wean sedation and SBT via trach collar -Continue pulmonary hygiene +/- hypertonic saline -Try to avoid anticholinergics due to thick secretions -Continue PAD/VAP, RAS of 0 to -1 -Continue antibiotics day 2, vancomycin and Zosyn -Chest x-ray pending -Con't routine trach care   Dysphagia, ongoing silent aspiration on MBSS.  Now s/p PEG 10/4 Oral thrush, reflux esophagitis likely contributing  to dysphagia. -con't working with SLP when possible -We will start tube feeds -on PPI & H2blocker -on oral nystatin   Acute  kidney injury on chronic kidney disease stage IIIb  -Continue CRRT -Monitor kidney function daily  Colorectal cancer: Completed chemotherapy July 2022  Lower extremity DVT- Continue Lovenox  Best Practice:  Diet: tubefeeds Pain/Anxiety/Delirium protocol (if indicated): yes  VAP protocol (if indicated): yes DVT prophylaxis: DOAC GI prophylaxis: PPI Glucose control: SSI/levimir Lines: Lines: N/A Foley: Foley:  Yes, and it is still needed Mobility: bed rest Code Status: full code Family Communication: Will call family today Disposition: ICU pending transfer to progressive  Labs   CBC: Recent Labs  Lab 02/07/21 0952 02/10/21 0407 02/11/21 1217 02/12/21 0445  WBC 18.4* 15.6* 21.7* 27.0*  NEUTROABS  --   --  16.1*  --   HGB 8.7* 8.1* 8.8* 8.8*  HCT 26.4* 24.2* 27.3* 26.7*  MCV 85.4 84.9 86.4 87.3  PLT 336 323 286 024    Basic Metabolic Panel: Recent Labs  Lab 02/06/21 0434 02/07/21 0258 02/08/21 0500 02/09/21 0415 02/10/21 0407 02/11/21 0432 02/12/21 0445  NA 139 137 138 139 143 138 140  K 4.3 3.7 3.4* 3.5 3.5 4.5 4.6  CL 105 102 101 103 108 101 102  CO2 20* 19* _0 21*  GLUCOSE 133* 134* 165* 165* 158* 192* 232*  BUN 72* 32* 28* 46* 58* 31* 45*  CREATININE 6.71* 4.22* 4.13* 5.47* 6.26* 4.32* 5.88*  CALCIUM 9.0 9.0 9.0 9.1 9.1 8.7* 8.9  MG 1.9 1.8 1.8 1.9 1.9  --   --   PHOS 6.3* 2.3* 4.2 3.8 3.8 2.7 5.0*   GFR: Estimated Creatinine Clearance: 14.3 mL/min (A) (by C-G formula based on SCr of 5.88 mg/dL (H)). Recent Labs  Lab 02/07/21 0952 02/10/21 0407 02/11/21 1217 02/11/21 1240 02/12/21 0445  PROCALCITON  --   --   --  4.24  --   WBC 18.4* 15.6* 21.7*  --  27.0*    Liver Function Tests: Recent Labs  Lab 02/08/21 0500 02/09/21 0415 02/10/21 0407 02/11/21 0432 02/12/21 0445  ALBUMIN 2.9* 2.8* 2.7* 2.8* 2.4*   No results for input(s): LIPASE, AMYLASE in the last 168 hours. No results for input(s): AMMONIA in the last 168  hours.  ABG    Component Value Date/Time   PHART 7.507 (H) 02/11/2021 1216   PCO2ART 28.7 (L) 02/11/2021 1216   PO2ART 42.4 (L) 02/11/2021 1216   HCO3 22.6 02/11/2021 1216   TCO2 21 (L) 01/01/2021 1430   ACIDBASEDEF 0.2 02/11/2021 1216   O2SAT 77.1 02/11/2021 1216     Coagulation Profile: Recent Labs  Lab 02/10/21 0407  INR 1.1    Cardiac Enzymes: No results for input(s): CKTOTAL, CKMB, CKMBINDEX, TROPONINI in the last 168 hours.  HbA1C: Hgb A1c MFr Bld  Date/Time Value Ref Range Status  02/06/2021 02:20 PM 6.4 (H) 4.8 - 5.6 % Final    Comment:    REPEATED TO VERIFY (NOTE) Pre diabetes:          5.7%-6.4%  Diabetes:              >6.4%  Glycemic control for   <7.0% adults with diabetes   12/01/2020 04:32 AM 8.7 (H) 4.8 - 5.6 % Final    Comment:    (NOTE)         Prediabetes: 5.7 - 6.4         Diabetes: >6.4  Glycemic control for adults with diabetes: <7.0     CBG: Recent Labs  Lab 02/11/21 1231 02/11/21 1747 02/11/21 1924 02/11/21 2305 02/12/21 0331  GLUCAP 185* 145* 149* 96 197*    Review of Systems:   See above  Past Medical History  He,  has a past medical history of Acute ischemic left MCA stroke ([place]) (04/09/2014), Acute respiratory failure with hypoxia ([facility]) (04/09/2014), Aspiration pneumonia ([place]), Asthma, Brain tumor ([place]), Cancer ([place]), Confusion, Diabetes mellitus without complication ([place]), Dizziness, Dyspnea, Epilepsy ([place]), GERD (gastroesophageal reflux disease), Headache, Hyperlipidemia, Hypertension, Peripheral edema, Peripheral neuropathy, Pneumonia (3 yrs ago), Seizures ([place]), Sleep apnea, and Urinary frequency.   Surgical History    Past Surgical History:  Procedure Laterality Date   BIOPSY  09/05/2020   Procedure: BIOPSY;  Surgeon: Irene Shipper, MD;  Location[facility] END[facility]COPY;  Service: Endoscopy;;   BIOPSY  02/06/2021   Procedure: BIOPSY;  Surgeon: Jerene Bears, MD;  Location: [facility] ENDOSCOPY;  Service: Gastroenterology;;    CARDIAC CATHETERIZATION  7 yrs ago   COLONOSCOPY WITH PROPOFOL N/A 09/05/2020   Procedure: COLONOSCOPY WITH PROPOFOL;  Surgeon: Irene Shipper, MD;  Location: [facility] ENDOSCOPY;  Service: Endoscopy;  Laterality: N/A;   CRANIOTOMY N/A 09/01/2016   Procedure: CRANIOTOMY TUMOR  LEFT PTERIONAL;  Surgeon: Ashok Pall, MD;  Location: [facility];  Service: Neurosurgery;  Laterality: N/A;   cyst removed from chest      as a child   ESOPHAGOGASTRODUODENOSCOPY (EGD) WITH PROPOFOL N/A 02/06/2021   Procedure: ESOPHAGOGASTRODUODENOSCOPY (EGD) WITH PROPOFOL;  Surgeon: Jerene Bears, MD;  Location[facility] END[facility]COPY;  Service: Gastroenterology;  Laterality: N/A;   IR FLUORO GUIDE CV LINE LEFT  12/22/2020   IR GASTROSTOMY TUBE MOD SED  02/10/2021   IR US GUIDE VASC ACCESS LEFT  12/22/2020   NO PAST SURGERIES     PARTIAL COLECTOMY N/A 09/10/2020   Procedure: TRANSVERSE COLECTOMY;  Surgeon: [name], MD;  Location: [place];  Service: General;  Laterality: N/A;   POLYPECTOMY  09/05/2020   Procedure: POLYPECTOMY;  Surgeon: Irene Shipper, MD;  Location: [place];  Service: Endoscopy;;   PORTACATH PLACEMENT Right 10/01/2020   Procedure: INSERTION PORT-A-CATH;  Surgeon: [name], MD;  Location: [place];  Service: General;  Laterality: Right;   SUBMUCOSAL TATTOO INJECTION  09/05/2020   Procedure: SUBMUCOSAL TATTOO INJECTION;  Surgeon: Irene Shipper, MD;  Location: [facility] ENDOSCOPY;  Service: Endoscopy;;   TRACHEOSTOMY TUBE PLACEMENT N/A 01/05/2021   Procedure: TRACHEOSTOMY;  Surgeon: [name], MD;  Location: [place];  Service: ENT;  Laterality: N/A;     Social History   reports that he has quit smoking. He has never used smokeless tobacco. He reports that he does not drink alcohol and does not use drugs.   Family History   His family history includes Breast cancer in his maternal aunt and mother; Prostate cancer in his maternal uncle.   Allergies Allergies  Allergen Reactions   Morphine And Related Other (See Comments)     Throat swelling     Home Medications  Prior to Admission medications   Medication Sig Start Date End Date Taking? Authorizing Provider  acetaminophen (TYLENOL) 500 MG tablet Take 1,000 mg by mouth 3 (three) times daily as needed for headache (pain).   Yes [provider]  albuterol (PROVENTIL HFA;VENTOLIN HFA) 108 (90 BASE) MCG/ACT inhaler Inhale 2 puffs into the lungs every 4 (four) hours as needed for wheezing or shortness of breath. 04/09/15  Yes Muthersbaugh, Jarrett Soho, PA-C  amLODipine (NORVASC) 10 MG tablet Take 10 mg by mouth daily. 05/11/20  Yes [provider]  capecitabine (XELODA) 500 MG tablet Take 3 tablets (1,500 mg total) by mouth 2 (two) times daily after a meal. 11/19/20  Yes Ladell Pier, MD  gabapentin (NEURONTIN) 300 MG capsule Take 1 capsule (300 mg total) by mouth 3 (three) times daily. 04/06/16  Yes Varney Biles, MD  levETIRAcetam (KEPPRA) 1000 MG tablet Take 1,000 mg by mouth 2 (two) times daily. 07/28/16  Yes [provider]  metFORMIN (GLUCOPHAGE-XR) 500 MG 24 hr tablet Take 500 mg by mouth 2 (two) times daily. 07/27/20  Yes [provider]  omeprazole (PRILOSEC) 20 MG capsule Take 20 mg by mouth daily. 08/10/16  Yes [provider]  atorvastatin (LIPITOR) 20 MG tablet Take 20 mg by mouth daily at 6 PM.    [provider]  lidocaine-prilocaine (EMLA) cream Apply 1 application topically as directed. Apply 1/2 tablespoon to port site 2 hours prior to stick and cover with plastic wrap to numb site Patient not taking: Reported on 10/08/2020 09/24/20   Ladell Pier, MD  ondansetron (ZOFRAN) 8 MG tablet Take 1 tablet (8 mg total) by mouth every 8 (eight) hours as needed for nausea or vomiting. Patient not taking: No sig reported 09/24/20   Ladell Pier, MD  prochlorperazine (COMPAZINE) 10 MG tablet Take 1 tablet (10 mg total) by mouth every 6 (six) hours as needed. Patient not taking: No sig reported 09/24/20    Ladell Pier, MD     Critical care time: [place], D.O.  Internal Medicine Resident, PGY-3 [facility]  Pager: [phone] 5:48 AM, 02/12/2021

## 2021-02-12 NOTE — Progress Notes (Signed)
[facility] Progress Note   65 year old gentleman with a history of colon cancer status post resection and colostomy May 2022.  Status post 3 cycles of chemotherapy last was in July 2022.  Type 2 diabetes with neuropathy hypertension hyperlipidemia presented to the emergency room 11/30/2020 with abdominal pain.  Was found to be secondary to sepsis.  Patient also was found to have acute kidney injury.  Developed atrial fibrillation with rapid ventricular rate and was admitted to intensive care unit for septic shock.  On 12/03/2020 he developed metabolic encephalopathy with hypoxia and aspiration pneumonia and required intubation 12/06/2020.  He also required pressor support.  He was reintubated since 12/15/2020 tracheostomy placed.  His hospital course  complicated by DVT.  It appears that he has recurrent Pseudomonas pneumonia.  He also has recurrent anemia requiring transfusion of 4 units of packed red blood cells.  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 CRRT initiated 7/31 and transitioned to IHD on 12/19/20.  S/p LIJ TDC placement 12/22/20.  Monitoring for signs of recovery but urine output has been fairly minimal and he remains dialysis dependent on TTS schedule currently - HD today in ICU.   2.Urinary retention   Foley catheter removed 10/3, following bladder scans. Has condom cath on now.    3.Volume overload/anasarca-improved with dialysis.   4.Sepsis/aspiration pneumonia/VDRF:aspiration pneumonia.  s/p trach; per HPI acute decompensation 10/5 due to mucus plugging - now improved back on TC   4.Stage IIIB colon cancer - sp partial colectomy, colostomy, completed chemoRx sp 3 cycles in 11/2020 - per notes planned for 4 cycles, 3rd cycle limited by toxicities/status.    5.Right leg DVT: On Eliquis.   6.Atrial fibrillation - on amiodarone and anticoagulation.     7.Anemia of critical  illness:  transfuse as needed, holding erythropoietin with malignancy.  Underwent EGD on 9/30.   8. CKD-MBD; started sevelamer for hyperphosphatemia on 9/8, monitor phosphorus level.Currently < 5.5.   9. CVA, incidental:  neuro performed w/u and has signed off - f/u in clinic planned.    9. Disposition -  Seen by palliative care team.  Discussion ongoing for possible transfer to [facility]AC/[facility].  Had G tube placed.    Subjective:   Hypoxic resp failure yesterday requiring ICU transfer, cuff trach exchange, Mech vent;  improved after mucus plugging removed with therapeutic bronch.  On TC now. Plans for HD today.  Wife bedside.    Objective:   BP 111/83   Pulse (!) 102   Temp 98.4 F (36.9 C) (Axillary)   Resp (!) 21   Ht 5' 8"  (1.727 m)   Wt 98.1 kg   SpO2 100%   BMI 32.88 kg/m   Intake/Output Summary (Last 24 hours) at 02/12/2021 1009 Last data filed at 02/12/2021 0955 Gross per 24 hour  Intake 1146.97 ml  Output 500 ml  Net 646.97 ml    Weight change: -1.1 kg  Physical Exam: General: Chronically ill-appearing, lying in bed, no distress  Heart: Normal rate Lungs: coarse BL, normal WOB on TC currently Abdomen:soft, Non-tender, non-distended, LUQ G tube dressing intact Extremities: Minimal edema, warm and well perfused Neurology: Alert awake and following commands  Dialysis Access: LIJ TDC in place.  c/d/i R chest wall port GU: condom draining cloudy urine, bladder nontender with palpation    Imaging: CT CHEST WO CONTRAST  Result Date: 02/11/2021 CLINICAL DATA:  Pneumonia.  Hypoxia.  EXAM: CT CHEST WITHOUT CONTRAST TECHNIQUE: Multidetector CT imaging of the chest was performed following the standard protocol without IV contrast. COMPARISON:  December 01, 2020. FINDINGS: Cardiovascular: Atherosclerosis of thoracic aorta is noted without aneurysm formation. Normal cardiac size. No pericardial effusion. Coronary artery calcifications are noted. Mediastinum/Nodes: Tracheostomy tube is  in grossly good position. The esophagus is unremarkable. No adenopathy is noted. Thyroid gland is unremarkable. Lungs/Pleura: No pneumothorax is noted. No pleural effusion is noted. Left lower lobe airspace opacity is noted with air bronchograms concerning for pneumonia. Abnormal soft tissue density is noted in the left upper and lower lobe bronchi concerning for aspiration or less likely mucous plugging. Minimal right posterior basilar subsegmental atelectasis is noted. Upper Abdomen: Gastrostomy tube is in grossly good position. Musculoskeletal: No chest wall mass or suspicious bone lesions identified. IMPRESSION: Left lower lobe airspace opacity is noted with air bronchograms consistent with pneumonia. Abnormal soft tissue density is seen occluding the left upper and lower lobe bronchi most consistent with aspiration or less likely mucous plugging. Coronary artery calcifications are noted. Aortic Atherosclerosis (ICD10-I70.0). Electronically Signed   By: Marijo Conception M.D.   On: 02/11/2021 17:14   IR GASTROSTOMY TUBE MOD SED  Result Date: 02/10/2021 CLINICAL DATA:  Oropharyngeal dysphagia, needs enteral feeding support EXAM: PERC PLACEMENT GASTROSTOMY FLUOROSCOPY TIME:  3 minute 7 seconds; 44 mGy TECHNIQUE: The procedure, risks, benefits, and alternatives were explained to the patient. Questions regarding the procedure were encouraged and answered. The patient understands and consents to the procedure. As antibiotic prophylaxis, cefazolin 2 g was ordered pre-procedure and administered intravenously within one hour of incision. Appropriate percutaneous approach was confirmed on recent CT. A 5 French angiographic catheter was placed as orogastric tube. The upper abdomen was prepped with Betadine, draped in usual sterile fashion, and infiltrated locally with 1% lidocaine. Intravenous Fentanyl 190mg and Versed 330mwere administered as conscious sedation during continuous monitoring of the patient's level of  consciousness and physiological / cardiorespiratory status by the radiology RN, with a total moderate sedation time of 13 minutes. 0.5 mg glucagon given IV. Stomach was insufflated using air through the orogastric tube. An 1873rench sheath needle was advanced percutaneously into the gastric lumen under fluoroscopy. Gas could be aspirated and a small contrast injection confirmed intraluminal spread. The sheath was exchanged over a guidewire for a 9 Fr[place]ascular sheath, through which the snare device was advanced and used to snare a guidewire passed through the orogastric tube. This was withdrawn, and the snare attached to the 20 French pull-through gastrostomy tube, which was advanced antegrade, positioned with the internal bumper securing the anterior gastric wall to the anterior abdominal wall. Small contrast injection confirms appropriate positioning. The external bumper was applied and the catheter was flushed. COMPLICATIONS: COMPLICATIONS none IMPRESSION: 1. Technically successful 20 French pull-through gastrostomy placement under fluoroscopy. Electronically Signed   By: D Lucrezia Europe.D.   On: 02/10/2021 14:41   DG Chest Port 1 View  Result Date: 02/12/2021 CLINICAL DATA:  Pneumonia EXAM: PORTABLE CHEST 1 VIEW COMPARISON:  Chest x-ray dated February 11, 2021 FINDINGS: Visualized cardiac and mediastinal contours are unchanged. Unchanged tracheostomy tube, right chest wall port and left dialysis catheter. Near complete resolution of left lung heterogeneous opacities. No large pleural effusion or evidence of pneumothorax. IMPRESSION: Near complete resolution of left lung heterogeneous opacities, favor improved atelectasis due to increased lung volumes. Electronically Signed   By: LeYetta Glassman.D.   On: 02/12/2021 08:34   DG CHEST  PORT 1 VIEW  Result Date: 02/11/2021 CLINICAL DATA:  Hypoxia. EXAM: PORTABLE CHEST 1 VIEW COMPARISON:  January 29, 2021. FINDINGS: The heart size and mediastinal contours  are within normal limits. Tracheostomy tube is unchanged in position. Left internal jugular dialysis catheter is in good position. Feeding tube has been removed. Right internal jugular Port-A-Cath is again noted. Right lung is clear. Increased diffuse left lung opacity is noted concerning for worsening pneumonia or atelectasis with associated left pleural effusion. The visualized skeletal structures are unremarkable. IMPRESSION: Increased diffuse left lung opacity is noted concerning for worsening pneumonia or atelectasis with possible small left pleural effusion. Electronically Signed   By: Marijo Conception M.D.   On: 02/11/2021 12:55    Labs: BMET Recent Labs  Lab 02/06/21 0434 02/07/21 0258 02/08/21 0500 02/09/21 0415 02/10/21 0407 02/11/21 0432 02/12/21 0445 02/12/21 0549  NA 139 137 138 139 143 138 140 144  K 4.3 3.7 3.4* 3.5 3.5 4.5 4.6 4.5  CL 105 102 101 103 108 101 102  --   CO2 20* 19* 24 22 23 24  21*  --   GLUCOSE 133* 134* 165* 165* 158* 192* 232*  --   BUN 72* 32* 28* 46* 58* 31* 45*  --   CREATININE 6.71* 4.22* 4.13* 5.47* 6.26* 4.32* 5.88*  --   CALCIUM 9.0 9.0 9.0 9.1 9.1 8.7* 8.9  --   PHOS 6.3* 2.3* 4.2 3.8 3.8 2.7 5.0*  --     CBC Recent Labs  Lab 02/07/21 0952 02/10/21 0407 02/11/21 1217 02/12/21 0445 02/12/21 0549  WBC 18.4* 15.6* 21.7* 27.0*  --   NEUTROABS  --   --  16.1*  --   --   HGB 8.7* 8.1* 8.8* 8.8* 8.8*  HCT 26.4* 24.2* 27.3* 26.7* 26.0*  MCV 85.4 84.9 86.4 87.3  --   PLT 336 323 286 253  --      Medications:     apixaban  5 mg Per Tube BID   chlorhexidine gluconate (MEDLINE KIT)  15 mL Mouth Rinse BID   Chlorhexidine Gluconate Cloth  6 each Topical Q0600   clonazePAM  0.5 mg Per Tube Q8H   docusate  100 mg Per Tube BID   famotidine  20 mg Per Tube BID   feeding supplement (NEPRO CARB STEADY)  1,000 mL Per Tube Q24H   feeding supplement (PROSource TF)  45 mL Per Tube BID   fentaNYL (SUBLIMAZE) injection  50 mcg Intravenous Once    hydrocerin   Topical BID   insulin aspart  0-15 Units Subcutaneous Q4H   insulin detemir  10 Units Subcutaneous Daily   levETIRAcetam  500 mg Per Tube BID   mouth rinse  15 mL Mouth Rinse 10 times per day   nystatin  5 mL Mouth/Throat QID   ondansetron  4 mg Intravenous Q6H   pantoprazole sodium  40 mg Per Tube BID   polyethylene glycol  17 g Per Tube Daily     Justin Mend  02/12/2021, 10:10 AM

## 2021-02-12 NOTE — Progress Notes (Signed)
He seems to have developed a panic attack with diffuse migrating pain. CXR clear SpO2 normal the entire time No BP drop Tachycardia correlating with level of anxiety Strengthen PRNs If keeps being an issue tonight would consider precedex drip; if not he is still okay for progressive

## 2021-02-12 NOTE — Progress Notes (Signed)
Patient returned from dialysis extremely anxious and developed severe pain when we repositioned him. HR in the 120s and RRs were 30-40 during this episode. MD made aware and instructed to give 100 mcg of Fentanyl and 2 mg of Ativan.   Patient appears more calm now. HR is still 116 and his oxygen saturations never dropped.

## 2021-02-12 NOTE — Progress Notes (Signed)
SLP Cancellation Note  Patient Details Name: Thomas [name] MRN: [id number] DOB: [birth year]   Cancelled treatment:       Reason Eval/Treat Not Completed: Patient not medically ready (P transferred to ICU on 10/5 due to worsening respiratory failure and is now on the vent. SLP will follow up.)  Corrin Hingle I. Hardin Negus, [place], [place] Office number [phone] Pager [place] 02/12/2021, 8:26 AM

## 2021-02-13 ENCOUNTER — Other Ambulatory Visit (HOSPITAL_COMMUNITY): Payer: Self-pay

## 2021-02-13 DIAGNOSIS — T17998A Other foreign object in respiratory tract, part unspecified causing other injury, initial encounter: Secondary | ICD-10-CM | POA: Diagnosis not present

## 2021-02-13 DIAGNOSIS — E1100 Type 2 diabetes mellitus with hyperosmolarity without nonketotic hyperglycemic-hyperosmolar coma (NKHHC): Secondary | ICD-10-CM | POA: Diagnosis not present

## 2021-02-13 DIAGNOSIS — Z992 Dependence on renal dialysis: Secondary | ICD-10-CM | POA: Diagnosis not present

## 2021-02-13 DIAGNOSIS — Z978 Presence of other specified devices: Secondary | ICD-10-CM | POA: Diagnosis not present

## 2021-02-13 DIAGNOSIS — J9601 Acute respiratory failure with hypoxia: Secondary | ICD-10-CM | POA: Diagnosis not present

## 2021-02-13 LAB — CBC
HCT: 25.7 % — ABNORMAL LOW (ref 39.0–52.0)
Hemoglobin: 8.4 g/dL — ABNORMAL LOW (ref 13.0–17.0)
MCH: 28.2 pg (ref 26.0–34.0)
MCHC: 32.7 g/dL (ref 30.0–36.0)
MCV: 86.2 fL (ref 80.0–100.0)
Platelets: 256 10*3/uL (ref 150–400)
RBC: 2.98 MIL/uL — ABNORMAL LOW (ref 4.22–5.81)
RDW: 14.9 % (ref 11.5–15.5)
WBC: 21.4 10*3/uL — ABNORMAL HIGH (ref 4.0–10.5)
nRBC: 0 % (ref 0.0–0.2)

## 2021-02-13 LAB — RENAL FUNCTION PANEL
Albumin: 2.4 g/dL — ABNORMAL LOW (ref 3.5–5.0)
Anion gap: 15 (ref 5–15)
BUN: 45 mg/dL — ABNORMAL HIGH (ref 8–23)
CO2: 22 mmol/L (ref 22–32)
Calcium: 8.7 mg/dL — ABNORMAL LOW (ref 8.9–10.3)
Chloride: 103 mmol/L (ref 98–111)
Creatinine, Ser: 5.23 mg/dL — ABNORMAL HIGH (ref 0.61–1.24)
GFR, Estimated: 12 mL/min — ABNORMAL LOW (ref >60)
Glucose, Bld: 124 mg/dL — ABNORMAL HIGH (ref 70–99)
Phosphorus: 4.1 mg/dL (ref 2.5–4.6)
Potassium: 3.9 mmol/L (ref 3.5–5.1)
Sodium: 140 mmol/L (ref 135–145)

## 2021-02-13 LAB — GLUCOSE, CAPILLARY
Glucose-Capillary: 146 mg/dL — ABNORMAL HIGH (ref 70–99)
Glucose-Capillary: 145 mg/dL — ABNORMAL HIGH (ref 70–99)
Glucose-Capillary: 191 mg/dL — ABNORMAL HIGH (ref 70–99)
Glucose-Capillary: 194 mg/dL — ABNORMAL HIGH (ref 70–99)
Glucose-Capillary: 137 mg/dL — ABNORMAL HIGH (ref 70–99)
Glucose-Capillary: 122 mg/dL — ABNORMAL HIGH (ref 70–99)

## 2021-02-13 LAB — PROCALCITONIN: Procalcitonin: 3.64 ng/mL

## 2021-02-13 MED ORDER — GUAIFENESIN 100 MG/5ML PO SOLN
10.0000 mL | Freq: Four times a day (QID) | ORAL | Status: DC
  Administered 2021-02-13 – 2021-02-24 (×44): 200 mg via ORAL
  Filled 2021-02-13: qty 10
  Filled 2021-02-13: qty 5
  Filled 2021-02-13 (×36): qty 10
  Filled 2021-02-13: qty 20
  Filled 2021-02-13 (×8): qty 10

## 2021-02-13 MED ORDER — MUPIROCIN 2 % EX OINT
1.0000 "application " | TOPICAL_OINTMENT | Freq: Two times a day (BID) | CUTANEOUS | Status: AC
  Administered 2021-02-13 – 2021-02-17 (×9): 1 via NASAL
  Filled 2021-02-13 (×4): qty 22

## 2021-02-13 MED ORDER — SODIUM CHLORIDE 3 % IN NEBU
4.0000 mL | INHALATION_SOLUTION | Freq: Two times a day (BID) | RESPIRATORY_TRACT | Status: AC
  Administered 2021-02-13 – 2021-02-15 (×5): 4 mL via RESPIRATORY_TRACT
  Filled 2021-02-13 (×5): qty 4

## 2021-02-13 NOTE — Progress Notes (Signed)
Speech Language Pathology Treatment: Dysphagia;Joffre Speaking valve  Patient Details Name: Thomas [name] MRN: [id number] DOB: 06-13-1955 Today's Date: 02/13/2021 Time: 3244-0102 SLP Time Calculation (min) (ACUTE ONLY): 24 min  Assessment / Plan / Recommendation Clinical Impression  Pt was seen for treatment with PMSV in place. Pt was asleep upon SLP's arrival and roused with verbal stimulation, but alertness remained suboptimal throughout the session. PMSV was in place upon SLP's arrival and he tolerated throughout the session with stable vitals and no respiratory distress. Pt was able to communicate verbally, but verbal output was limited. Pt continues to demonstrate limited lingual movement, but some lingual lateralization was noted and lingual retraction continues to be demonstrated with production of velar phonemes. Palatalization was noted during production of alveolar phonemes and no definite lingual elevation was demonstrated despite cues. Oral care was provided with traditional toothbrush and toothpaste and yellow, dried secretions were cleared from the anterior hard palate and left lateral sulcus. Superior lingual surface looks improved since Nystatin has been initiated. Pt reported that he was tired and wanted to rest, but he was amenable to accepting limited ice chips. Pt's level of alertness was waning, but he swallowed trace water with delayed coughing once he was reclined. PMSV was removed at the end of the session since pt reported that he will be going to sleep. PMSV may be used during all waking waking hours and pt may continue to have ice chips following thorough oral care. SLP will continue to follow pt.     HPI HPI: Pt is a 65 y/o male admitted on 7/25 for worsening sepsis vs reaction to chemotherapy with high ostomy output.  Pt developed worsening hypoxia and was requiring BIPAP w/ FIO2 of 100% and increased work of breathing. SLP consulted due to concern for aspiration. BSE 7/28  recommended NPO status and MBS, but this could not completed subsequently due to lethargy. CXR in AM of 7/30 showed some worsening concern of aspiration. ETT 7/30-8/8; tolerated nasal cannula for about an hour; reintubated 8/8-trach 8/11. Pt pulled out trach x2 8/22; replaced by PCCM. Bronch on 8/22 revealed endotracheal granulation tissue at trach site. Trach removed again on 8/25; unable to reinsert despite multiple attenpts; pt reintubated 8/25- trach by ENT on 8/29. BSE 8/22 limited to ice chips due to pt's refusal of other consistencies; rx NPO status with Cortrak.MRI brain 9/30: Multiple punctate foci of restricted diffusion in bilateral  frontal white matter, most likely acute infarcts. EGD 9/30:grade C reflux esophagitis/non-bleeding duodenal ulcers.Pt s/p PEG 10/4.  Trach changed to cuffed and pt transferred to ICU on 10/5 due to respiratory distress with mucous plug; bronch compled; vent 10/5-10/6. Bronch 10/5: Significant mucous plugging of the LUL with BAL and suctioning up until the subsegmental region. Trach replaced to #[id number] uncuffed. 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      SLP Plan  Continue with current plan of care      Recommendations for follow up therapy are one component of a multi-disciplinary discharge planning process, led by the attending physician.  Recommendations may be updated based on patient status, additional functional criteria and insurance authorization.    Recommendations         Patient may use Passy-Muir Speech Valve: During all waking hours (remove during sleep) PMSV  Supervision: Intermittent         Oral Care Recommendations: Oral care QID;Patient independent with oral care (enourage pt to  complete his own oral care) Follow up Recommendations: Skilled Nursing facility SLP Visit Diagnosis: Dysphagia, oropharyngeal phase (R13.12);Aphonia (R49.1) Plan: Continue with current plan of care       Khaliel Morey I. Hardin Negus, [place], [place] Office number [phone] Pager [place]  02/13/2021, 5:38 PM

## 2021-02-13 NOTE — Progress Notes (Signed)
PROGRESS NOTE    Thomas [name]  IRW:[id number] DOB: Jun 13, 1955 DOA: 11/30/2020 PCP: [name], NP    Chief Complaint  Patient presents with   Abdominal Pain    Brief Narrative:   Thomas [name] is a 65 y.o. with a pertinent PMH of CVA, colon cancer s/p colectomy/colostomy, T2DM, ESRD (on HD), Epilepsy, HTN, HLD, OSA, DVT (lovenox), who presented in August  after recent surgery for colon cancer s/p resection with signs and symptoms of sepsis and worsening respiratory distress  admitted for aspiration pneumonia. Hospital course complicated by ischemic colitis and prolonged respiratory failure requiring tracheostomy, patient with very complicated hospital stay, please see below.       7/27 Transferred to [facility], stopped abx as sepsis resolved/ruled out 7/30 AM Worsened AME, more hypoxic with worsening renal funciton, new aspiration on abx restarted 7/30 PM Pt with increased o2 requirement on 100% FIO2 on BIPAP, d/w family who wanted to proceed with intubation, pt intubated 7/31 Tracheal aspirate grew moderate candida tropicalis  8/1 Some issues with tachycardia and hypotension overnight. Reproted high ostomy output.  GI PCR negative 8/2 down to 9 mcg of Levophed, however repeat CT abdomen/pelvis showed inflammatory wall thickening and fat stranding of the descending and sigmoid colon, new since prior, consistent with nonspecific infectious, inflammatory, or ischemic colitis.  Also new dense consolidative airspace opacity in the right lower and right middle lobes 8/3 further weaning NE. Weaning sedation. Full code, full scope per family meeting 8/2. Eraxis stopped (8/1-8/3) 8/4 off pressors, plt down to 103.  8/5 remains off pressors. Moving around more, not really following commands though  8/9 failed extubation 8/8, tolerated nasal cannula for about an hour, desaturations and inability to clear secretions-reintubated 8/8 8/10 some agitation overnight 8/11 tracheostomy placement On trach collar  since 8/16 8/22 pulled trach out, replaced under bronch guidance. Has proximal area of granulation tissue that is above the bottom of the trach tube.  8/25 Removed Trach, unable to reinsert, despite multiple attempts re-intubated 8/29 Anemia, 1 unit PRBC. ENT re-placed Trach in [place]. 9/8 tracheal aspirate growing pan-sensitive P. Aeruginosa. Started on Ceftazidine  9/19 Continues to have thick secretions.  10/5 had respiratory distress and increase in O2 requirements, frequent suctioning. Ct chest showed large mucus plug in the Lft upper/lower lobes, transferred to the ICU for mechanical ventilation. 10/6 wean sedation and SBT via trach collar    Assessment & Plan:   Principal Problem:   Diabetes mellitus with hyperosmolarity without hyperglycemic hyperosmolar nonketotic coma ([place]) Active Problems:   Lactic acidosis   Acute renal failure superimposed on stage 2 chronic kidney disease (HCC)   Metabolic acidosis   Tachycardia   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 (HCC)   Adverse effect of chemotherapy   Pressure injury of skin   Atrial fibrillation with RVR (HCC)   Sepsis (HCC)   Malnutrition of moderate degree   Acute metabolic encephalopathy   Acute respiratory failure with hypoxemia (HCC)   Acute renal failure with acute cortical necrosis (HCC)   Shock ([place])   Status post tracheostomy ([place])   Endotracheal tube present   Hemodialysis status ([place])   Palliative care by specialist   SOB (shortness of breath)   Non-intractable vomiting   Abdominal pain, epigastric   Cerebral embolism with cerebral infarction   Acute esophagitis   Duodenal ulcer disease   Acute hypoxic respiratory failure status post tracheostomy -Pulled his tracheostomy multiple times, his tracheostomy is complicated by intratracheal granulation tissue,  with difficult to place trach, ENT changed trach on 8/29 to cuffless trach. -Patient with aspiration with mucous plug, s/p  bronchoscopy with resolution of his mucous plug, antibiotics per PCCM, he is on day 3 vancomycin and Zosyn, BAL showing gram-negative rods and gram-positive cocci.  Procalcitonin trending down. -Wean oxygen as tolerated, continue with pulmonary hygiene.  Dysphagia/reflux esophagitis/nonbleeding duodenal ulcers -EGD 9/30 with the above findings difficult for esophagitis and nonbleeding duodenal ulcer.,  No H. pylori identified on biopsy. -Patient needs to continue PPI for total of 4-weeks (initiated 9/29)-have transitioned Protonix and Pepcid to oral route -For now continue scheduled Zofran -Follow-up EGD recommended after discharge -Patient with dysphagia, failed MBBS, PEG inserted 10/4.  MRI with bilateral punctate lesion ([place]) -SLP evaluated and noted patient is unable to elevate tongue properly bilaterally-continues to have difficulty with swallowing and phonation -Imaging consistent with acute infarcts -Stroke team following-etiology of infarcts yet to be determined-differential includes thrombotic etiology from either A. fib or from DVT if has PFO  Acute extensive right leg DVT:  -On anticoagulation   New onset atrial fibrillation with RVR:  -On amiodarone and anticoagulation. -Currently maintaining sinus rhythm w/o meds -Continue Lovenox (on hold pending PEG tube placement)  CVA (incidental -Neuro Signed off  ESRD -HD per renal    DVT prophylaxis: Eliquis Code Status: Full Family Communication: None at bedside Disposition:   Status is: Inpatient  Remains inpatient appropriate because:Unsafe d/c plan  Dispo: The patient is from: Home              Anticipated d/c is to: SNF              Patient currently is not medically stable to d/c.   Difficult to place patient Yes    Consultants: PCCM General surgery Nephrology Hematology/oncology Palliative care medicine team   Procedures: 2u PRBC 7/31, 1u PRBC 8/17, 1u PRBC 8/27 Tracheostomy revision  01/05/2021 Dr. Constance Holster [facility] 8/22        Subjective:  Confused . unable to provide any complaints, he was agitated and restless overnight required as needed medications.  Objective: Vitals:   02/13/21 1315 02/13/21 1330 02/13/21 1345 02/13/21 1400  BP:    103/67  Pulse: 90 88 88 88  Resp: _0 Temp:      TempSrc:      SpO2: 100% 100% 100% 100%  Weight:      Height:        Intake/Output Summary (Last 24 hours) at 02/13/2021 1503 Last data filed at 02/13/2021 1248 Gross per 24 hour  Intake 4983.91 ml  Output 777 ml  Net 4206.91 ml   Filed Weights   02/12/21 0600 02/12/21 1530 02/13/21 0600  Weight: 98.1 kg 97 kg 98.4 kg    Examination:   He is alert, confused, does not follow commands today Tracheostomy in place Symmetrical Chest wall movement, Good air movement bilaterally, CTAB RRR,No Gallops,Rubs or new Murmurs, No Parasternal Heave +ve B.Sounds, Abd Soft, colostomy present, PEG present No Cyanosis, Clubbing or edema, No new Rash or bruise      Data Reviewed: I have personally reviewed following labs and imaging studies  CBC: Recent Labs  Lab 02/07/21 0952 02/10/21 0407 02/11/21 1217 02/12/21 0445 02/12/21 0549 02/13/21 0158  WBC 18.4* 15.6* 21.7* 27.0*  --  21.4*  NEUTROABS  --   --  16.1*  --   --   --   HGB 8.7* 8.1* 8.8* 8.8* 8.8* 8.4*  HCT 26.4* 24.2*  27.3* 26.7* 26.0* 25.7*  MCV 85.4 84.9 86.4 87.3  --  86.2  PLT 336 323 286 253  --  097    Basic Metabolic Panel: Recent Labs  Lab 02/07/21 0258 02/08/21 0500 02/09/21 0415 02/10/21 0407 02/11/21 0432 02/12/21 0445 02/12/21 0549 02/13/21 0158  NA 137 138 139 143 138 140 144 140  K 3.7 3.4* 3.5 3.5 4.5 4.6 4.5 3.9  CL 102 101 103 108 101 102  --  103  CO2 19* _0 21*  --  22  GLUCOSE 134* 165* 165* 158* 192* 232*  --  124*  BUN 32* 28* 46* 58* 31* 45*  --  45*  CREATININE 4.22* 4.13* 5.47* 6.26* 4.32* 5.88*  --  5.23*  CALCIUM 9.0 9.0 9.1 9.1 8.7* 8.9  --  8.7*  MG 1.8  1.8 1.9 1.9  --   --   --   --   PHOS 2.3* 4.2 3.8 3.8 2.7 5.0*  --  4.1    GFR: Estimated Creatinine Clearance: 16.2 mL/min (A) (by C-G formula based on SCr of 5.23 mg/dL (H)).  Liver Function Tests: Recent Labs  Lab 02/09/21 0415 02/10/21 0407 02/11/21 0432 02/12/21 0445 02/13/21 0158  ALBUMIN 2.8* 2.7* 2.8* 2.4* 2.4*    CBG: Recent Labs  Lab 02/12/21 2012 02/12/21 2335 02/13/21 0352 02/13/21 0741 02/13/21 1214  GLUCAP 151* 160* 145* 191* 194*     Recent Results (from the past 240 hour(s))  Culture, Respiratory w Gram Stain     Status: None (Preliminary result)   Collection Time: 02/11/21 12:24 PM   Specimen: Tracheal Aspirate; Respiratory  Result Value Ref Range Status   Specimen Description TRACHEAL ASPIRATE  Final   Special Requests NONE  Final   Gram Stain   Final    ABUNDANT WBC PRESENT,BOTH PMN AND MONONUCLEAR FEW GRAM NEGATIVE RODS RARE GRAM POSITIVE COCCI    Culture   Final    MODERATE GRAM NEGATIVE RODS IDENTIFICATION AND SUSCEPTIBILITIES TO FOLLOW Performed at [facility], [place] [address] [place], [place] [address]    Report Status PENDING  Incomplete         Radiology Studies: DG Chest 1 View  Result Date: 02/12/2021 CLINICAL DATA:  Chest pain EXAM: PORTABLE CHEST 1 VIEW COMPARISON:  02/12/2021 FINDINGS: Tracheostomy tube, dialysis catheter and right-sided chest wall port are again seen and stable. Cardiac shadow is within normal limits. Lungs are clear bilaterally. No bony abnormality is noted. IMPRESSION: No active disease. Electronically Signed   By: Inez Catalina M.D.   On: 02/12/2021 19:33   CT CHEST WO CONTRAST  Result Date: 02/11/2021 CLINICAL DATA:  Pneumonia.  Hypoxia. EXAM: CT CHEST WITHOUT CONTRAST TECHNIQUE: Multidetector CT imaging of the chest was performed following the standard protocol without IV contrast. COMPARISON:  December 01, 2020. FINDINGS: Cardiovascular: Atherosclerosis of thoracic aorta is noted without aneurysm  formation. Normal cardiac size. No pericardial effusion. Coronary artery calcifications are noted. Mediastinum/Nodes: Tracheostomy tube is in grossly good position. The esophagus is unremarkable. No adenopathy is noted. Thyroid gland is unremarkable. Lungs/Pleura: No pneumothorax is noted. No pleural effusion is noted. Left lower lobe airspace opacity is noted with air bronchograms concerning for pneumonia. Abnormal soft tissue density is noted in the left upper and lower lobe bronchi concerning for aspiration or less likely mucous plugging. Minimal right posterior basilar subsegmental atelectasis is noted. Upper Abdomen: Gastrostomy tube is in grossly good position. Musculoskeletal: No chest wall mass or suspicious bone  lesions identified. IMPRESSION: Left lower lobe airspace opacity is noted with air bronchograms consistent with pneumonia. Abnormal soft tissue density is seen occluding the left upper and lower lobe bronchi most consistent with aspiration or less likely mucous plugging. Coronary artery calcifications are noted. Aortic Atherosclerosis (ICD10-I70.0). Electronically Signed   By: Marijo Conception M.D.   On: 02/11/2021 17:14   DG Chest Port 1 View  Result Date: 02/12/2021 CLINICAL DATA:  Pneumonia EXAM: PORTABLE CHEST 1 VIEW COMPARISON:  Chest x-ray dated February 11, 2021 FINDINGS: Visualized cardiac and mediastinal contours are unchanged. Unchanged tracheostomy tube, right chest wall port and left dialysis catheter. Near complete resolution of left lung heterogeneous opacities. No large pleural effusion or evidence of pneumothorax. IMPRESSION: Near complete resolution of left lung heterogeneous opacities, favor improved atelectasis due to increased lung volumes. Electronically Signed   By: Yetta Glassman M.D.   On: 02/12/2021 08:34        Scheduled Meds:  apixaban  5 mg Per Tube BID   chlorhexidine gluconate (MEDLINE KIT)  15 mL Mouth Rinse BID   Chlorhexidine Gluconate Cloth  6 each  Topical Q0600   clonazePAM  1 mg Per Tube Q8H   docusate  100 mg Per Tube BID   famotidine  20 mg Per Tube BID   feeding supplement (PROSource TF)  45 mL Per Tube BID   fentaNYL (SUBLIMAZE) injection  100 mcg Intravenous Once   guaiFENesin  10 mL Oral Q6H   hydrocerin   Topical BID   insulin aspart  0-15 Units Subcutaneous Q4H   insulin detemir  10 Units Subcutaneous Daily   levETIRAcetam  500 mg Per Tube BID   mouth rinse  15 mL Mouth Rinse 10 times per day   mupirocin ointment  1 application Nasal BID   nystatin  5 mL Mouth/Throat QID   pantoprazole sodium  40 mg Per Tube BID   polyethylene glycol  17 g Per Tube Daily   sodium chloride HYPERTONIC  4 mL Nebulization BID   Continuous Infusions:  sodium chloride Stopped (12/20/20 0955)   anticoagulant sodium citrate     feeding supplement (NEPRO CARB STEADY) 1,000 mL (02/13/21 1234)   piperacillin-tazobactam (ZOSYN)  IV Stopped (02/13/21 1045)   promethazine (PHENERGAN) injection (IM or IVPB) 12.5 mg (02/05/21 0416)     LOS: 48 days       Phillips Climes, MD [facility]   To contact the attending provider between 7A-7P or the covering provider during after hours 7P-7A, please log into the web site [email or web address] and access using universal [place] password for that web site. If you do not have the password, please call the hospital operator.  02/13/2021, 3:03 PM

## 2021-02-13 NOTE — Progress Notes (Signed)
Physical Therapy Treatment Patient Details Name: Thomas [name] MRN: [id number] DOB: 05/26/1955 Today's Date: 02/13/2021   History of Present Illness 65 y.o. male admitted 7/24 with high ostomy output. Pt with severe sepsis and multisystem organ failure. Hospital admission complicated by volume overload, aspiration pneumonia, acute respiratory failure, AKI and ischemic colitis. Intubated 7/30. S/p trach placement 8/11. [facility] 8/12. HD initiated 8/12. To IR 8/15 for tunneled HD cath. Pulled out trach x2 8/22 replaced by PCCM. Pulled out trach again 8/25 but unable to be reinserted with pt intubated. 8/29 to OR for stoma clean up and trach replacement.Trach downsized 9/10. 10/5 respiratory distress with mucous plug requiring bronch, trach change and ventilator 10/5-10/6.  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    PT Comments    Pt awake, anxious with significant mouth secretions with difficulty understanding pt due to trach. Pt willing to attempt chair egress positioning and limited HEP but denied EOB  or increased unsupported sitting. Pt educated for HEP , need for progression and decline in strength and function with lack of mobility when self-limiting sessions. Will continue to educate and encourage pt to increase activity.   HR 93 BP 94/67 (77) 100% on trach collar at 28% FiO2   Recommendations for follow up therapy are one component of a multi-disciplinary discharge planning process, led by the attending physician.  Recommendations may be updated based on patient status, additional functional criteria and insurance authorization.  Follow Up Recommendations  SNF     Equipment Recommendations  Wheelchair (measurements PT);Wheelchair cushion (measurements PT);3in1 (PT);Rolling  walker with 5" wheels    Recommendations for Other Services       Precautions / Restrictions Precautions Precautions: Fall Precaution Comments: R subclavian tunneled HD cath, ostomy, peg, trach     Mobility  Bed Mobility Overal bed mobility: Needs Assistance Bed Mobility: Supine to Sit;Sit to Supine           General bed mobility comments: utilized foot egress however bed would not fully position to egress and in sitting with use of bil rails pt able to pull trunk off surface and sit with limited support max 20 sec x 2 trials. Total +2 to slide toward HOB. Pt denied further bed mobility or transfers    Transfers                    Ambulation/Gait                 Stairs             Wheelchair Mobility    Modified Rankin (Stroke Patients Only)       Balance Overall balance assessment: Needs assistance Sitting-balance support: Bilateral upper extremity supported;Feet unsupported Sitting balance-Leahy Scale: Poor Sitting balance - Comments: reliant on bil UE support                                    Cognition Arousal/Alertness: Awake/alert Behavior During Therapy: Flat affect Overall Cognitive Status: Difficult to assess Area of Impairment: Safety/judgement;Awareness;Problem solving;Following commands;Attention                   Current Attention Level: Sustained   Following Commands: Follows one step commands inconsistently;Follows one step commands with increased time  Safety/Judgement: Decreased awareness of safety;Decreased awareness of deficits   Problem Solving: Slow processing;Requires verbal cues;Requires tactile cues General Comments: pt reports discomfort with all mobility and positioning, internally distracted by discomfort, anxious      Exercises General Exercises - Lower Extremity Long Arc Quad: AROM;Both;Seated;20 reps Hip ABduction/ADduction: AAROM;20 reps;Both;Seated Hip Flexion/Marching:  AAROM;Both;20 reps    General Comments        Pertinent Vitals/Pain Pain Score: 6  Pain Location: abdomen, back, flank Pain Descriptors / Indicators: Grimacing;Guarding;Restless Pain Intervention(s): Limited activity within patient's tolerance;Repositioned;Monitored during session    Home Living                      Prior Function            PT Goals (current goals can now be found in the care plan section) Acute Rehab PT Goals Time For Goal Achievement: 02/27/21 Potential to Achieve Goals: Fair Progress towards PT goals: Not progressing toward goals - comment;Goals downgraded-see care plan (pt with limited participation)    Frequency    Min 2X/week      PT Plan Current plan remains appropriate    Co-evaluation              AM-PAC PT "6 Clicks" Mobility   Outcome Measure  Help needed turning from your back to your side while in a flat bed without using bedrails?: A Lot Help needed moving from lying on your back to sitting on the side of a flat bed without using bedrails?: A Lot Help needed moving to and from a bed to a chair (including a wheelchair)?: Total Help needed standing up from a chair using your arms (e.g., wheelchair or bedside chair)?: Total Help needed to walk in hospital room?: Total Help needed climbing 3-5 steps with a railing? : Total 6 Click Score: 8    End of Session Equipment Utilized During Treatment: Oxygen Activity Tolerance: Patient limited by pain Patient left: in bed;with call bell/phone within reach;with bed alarm set Nurse Communication: Mobility status PT Visit Diagnosis: Muscle weakness (generalized) (M62.81);Other abnormalities of gait and mobility (R26.89)     Time: 0263-7858 PT Time Calculation (min) (ACUTE ONLY): 21 min  Charges:  $Therapeutic Activity: 8-22 mins                     Saniya Tranchina P, PT Acute Rehabilitation Services Pager: [phone] Office: [place]cca[place] 02/13/2021, 10:03  AM

## 2021-02-13 NOTE — Progress Notes (Signed)
NAME:  Thomas [name], MRN:  [id number], DOB:  1955-06-03, LOS: 89 ADMISSION DATE:  11/30/2020, CONSULTATION DATE:  12/06/2020 REFERRING MD:  Riccardo Dubin, CHIEF COMPLAINT:  Dyspnea   Brief History   Thomas [name] is a 65 y.o. with a pertinent PMH of CVA, colon cancer s/p colectomy/colostomy, T2DM, ESRD (on HD), Epilepsy, HTN, HLD, OSA, DVT (lovenox), who presented in August  after recent surgery for colon cancer s/p resection with signs and symptoms of sepsis and worsening respiratory distress  admitted for aspiration pneumonia. Hospital course complicated by ischemic colitis and prolonged respiratory failure requiring tracheostomy and recently developed mucus plug requiring transfer to the ICU for MV.  Past Medical History  CVA (Lt MCA) Asthma T2DM Epilepsy  HTN HLD OSA Brain tumor s/p craniotomy   Significant Hospital Events   7/27 Transferred to [facility], stopped abx as sepsis resolved/ruled out 7/30 AM Worsened AME, more hypoxic with worsening renal funciton, new aspiration on abx restarted 7/30 PM Pt with increased o2 requirement on 100% FIO2 on BIPAP, d/w family who wanted to proceed with intubation, pt intubated 7/31 Tracheal aspirate grew moderate candida tropicalis  8/1 Some issues with tachycardia and hypotension overnight. Reproted high ostomy output.  GI PCR negative 8/2 down to 9 mcg of Levophed, however repeat CT abdomen/pelvis showed inflammatory wall thickening and fat stranding of the descending and sigmoid colon, new since prior, consistent with nonspecific infectious, inflammatory, or ischemic colitis.  Also new dense consolidative airspace opacity in the right lower and right middle lobes 8/3 further weaning NE. Weaning sedation. Full code, full scope per family meeting 8/2. Eraxis stopped (8/1-8/3) 8/4 off pressors, plt down to 103.  8/5 remains off pressors. Moving around more, not really following commands though  8/9 failed extubation 8/8, tolerated nasal cannula for about an  hour, desaturations and inability to clear secretions-reintubated 8/8 8/10 some agitation overnight 8/11 tracheostomy placement On trach collar since 8/16 8/22 pulled trach out, replaced under bronch guidance. Has proximal area of granulation tissue that is above the bottom of the trach tube.  8/25 Removed Trach, unable to reinsert, despite multiple attempts re-intubated 8/29 Anemia, 1 unit PRBC. ENT re-placed Trach in [place]. 9/8 tracheal aspirate growing pan-sensitive P. Aeruginosa. Started on Ceftazidine  9/19 Continues to have thick secretions.  10/5 had respiratory distress and increase in O2 requirements, frequent suctioning. Ct chest showed large mucus plug in the Lft upper/lower lobes, transferred to the ICU for mechanical ventilation. 10/6 wean sedation and SBT via trach collar  Consults:  none  Procedures:  10/5 - Trach cuff exchange and Bronchoscopy   Significant Diagnostic Tests:  CT CHEST WO CONTRAST Result Date: 02/11/2021 FINDINGS: Cardiovascular: Atherosclerosis of thoracic aorta is noted without aneurysm formation. Normal cardiac size. No pericardial effusion. Coronary artery calcifications are noted. Mediastinum/Nodes: Tracheostomy tube is in grossly good position. The esophagus is unremarkable. No adenopathy is noted. Thyroid gland is unremarkable. Lungs/Pleura: No pneumothorax is noted. No pleural effusion is noted. Left lower lobe airspace opacity is noted with air bronchograms concerning for pneumonia. Abnormal soft tissue density is noted in the left upper and lower lobe bronchi concerning for aspiration or less likely mucous plugging. Minimal right posterior basilar subsegmental atelectasis is noted. Upper Abdomen: Gastrostomy tube is in grossly good position. Musculoskeletal: No chest wall mass or suspicious bone lesions identified.  IMPRESSION: Left lower lobe airspace opacity is noted with air bronchograms consistent with pneumonia. Abnormal soft tissue density is seen  occluding the left upper and lower lobe  bronchi most consistent with aspiration or less likely mucous plugging. Coronary artery calcifications are noted. Aortic Atherosclerosis (   DG CHEST PORT 1 VIEW Result Date: 02/11/2021 FINDINGS: The heart size and mediastinal contours are within normal limits. Tracheostomy tube is unchanged in position. Left internal jugular dialysis catheter is in good position. Feeding tube has been removed. Right internal jugular Port-A-Cath is again noted. Right lung is clear. Increased diffuse left lung opacity is noted concerning for worsening pneumonia or atelectasis with associated left pleural effusion. The visualized skeletal structures are unremarkable.  IMPRESSION: Increased diffuse left lung opacity is noted concerning for worsening pneumonia or atelectasis with possible small left pleural effusion.     DG CHEST PORT 1 VIEW Result Date: 02/11/2021  Micro Data:  10/5 BAL - few GPR/Rare GPC, culture pending  Antimicrobials:  10/4 Cefazolin 10/5 Van> 10/5 Zosyn>    Interim History/Subjective:  Seen in bed on trach collar.  He is awake and alert and following commands.  He denies any new problems today.    Objective:  Blood pressure (!) 112/59, pulse 96, temperature 97.6 F (36.4 C), temperature source Axillary, resp. rate (!) 28, height _0  (1.727 m), weight 98.4 kg, SpO2 100 %.    FiO2 (%):  [35 %-40 %] 35 %   Intake/Output Summary (Last 24 hours) at 02/13/2021 0754 Last data filed at 02/13/2021 0630 Gross per 24 hour  Intake 1021.17 ml  Output 777 ml  Net 244.17 ml   Filed Weights   02/12/21 0600 02/12/21 1530 02/13/21 0600  Weight: 98.1 kg 97 kg 98.4 kg    Examination: General: Alert HENT: Tracheostomy in place on sup O@ Lungs: Clear to auscultation bilaterally Cardiovascular: RRR, no murmurs rubs or gallops Abdomen: Soft, nondistended Extremities: Warm, dry, normal pulses Neuro: No focal neurologic deficits GU: n/a  Resolved  Hospital Problem list     Assessment & Plan:  Thomas [name] is a 65 y.o. with a pertinent PMH of CVA, colon cancer s/p colectomy/colostomy, T2DM, ESRD (on HD), Epilepsy, HTN, HLD, OSA, DVT (lovenox), who presented in August  after recent surgery for colon cancer s/p resection with signs and symptoms of sepsis and worsening respiratory distress  admitted for aspiration pneumonia. Hospital course complicated by ischemic colitis and prolonged respiratory failure requiring tracheostomy and recently developed mucus plug requiring transfer to the ICU for MV.  Acute hypoxemic respiratory failure s/p tracheostomy Endotracheal granulation tissue-  Trach revision by ENT when removed and unable to be replaced at bedside. ENT changed on 8/29 to cuffless trach. Obstructive sleep apnea Aspiration with mucus plug Patient remained stable on trach collar with FiO2 of 35% and SPO2 of 100%.  He is status post bronchoscopy with resolution of his mucous plug.  He is on day 3 of vancomycin and Zosyn.  BAL showed pink fluid with elevated PMNs and bacteria present.  Culture grew rare GPC's in few GNR.procalcitonin trending down from 4.56-3.64 today.  -Continue supplemental oxygen per trach collar -Continue pulmonary hygiene +/- hypertonic saline -Try to avoid anticholinergics due to thick secretions -Continue PAD/VAP, RAS of 0 to -1 -Continue antibiotics day 3, vancomycin and Zosyn -Con't routine trach care   Dysphagia, ongoing silent aspiration on MBSS.  Now s/p PEG 10/4 Oral thrush, reflux esophagitis likely contributing to dysphagia. -con't working with SLP when possible -We will start tube feeds -on PPI & H2blocker -on oral nystatin   Acute kidney injury on chronic kidney disease stage IIIb  -Continue CRRT -Monitor kidney function daily  Colorectal cancer: Completed chemotherapy July 2022  Lower extremity DVT- Continue Lovenox  Best Practice:  Diet: tubefeeds Pain/Anxiety/Delirium protocol (if  indicated): yes  VAP protocol (if indicated): yes DVT prophylaxis: DOAC GI prophylaxis: PPI Glucose control: SSI/levimir Lines: Lines: N/A Foley: Foley:  Yes, and it is still needed Mobility: bed rest Code Status: full code Family Communication: Will call family today Disposition: ICU pending transfer to progressive  Labs   CBC: Recent Labs  Lab 02/07/21 0952 02/10/21 0407 02/11/21 1217 02/12/21 0445 02/12/21 0549 02/13/21 0158  WBC 18.4* 15.6* 21.7* 27.0*  --  21.4*  NEUTROABS  --   --  16.1*  --   --   --   HGB 8.7* 8.1* 8.8* 8.8* 8.8* 8.4*  HCT 26.4* 24.2* 27.3* 26.7* 26.0* 25.7*  MCV 85.4 84.9 86.4 87.3  --  86.2  PLT 336 323 286 253  --  417    Basic Metabolic Panel: Recent Labs  Lab 02/07/21 0258 02/08/21 0500 02/09/21 0415 02/10/21 0407 02/11/21 0432 02/12/21 0445 02/12/21 0549 02/13/21 0158  NA 137 138 139 143 138 140 144 140  K 3.7 3.4* 3.5 3.5 4.5 4.6 4.5 3.9  CL 102 101 103 108 101 102  --  103  CO2 19* _0 21*  --  22  GLUCOSE 134* 165* 165* 158* 192* 232*  --  124*  BUN 32* 28* 46* 58* 31* 45*  --  45*  CREATININE 4.22* 4.13* 5.47* 6.26* 4.32* 5.88*  --  5.23*  CALCIUM 9.0 9.0 9.1 9.1 8.7* 8.9  --  8.7*  MG 1.8 1.8 1.9 1.9  --   --   --   --   PHOS 2.3* 4.2 3.8 3.8 2.7 5.0*  --  4.1   GFR: Estimated Creatinine Clearance: 16.2 mL/min (A) (by C-G formula based on SCr of 5.23 mg/dL (H)). Recent Labs  Lab 02/10/21 0407 02/11/21 1217 02/11/21 1240 02/12/21 0445 02/13/21 0158  PROCALCITON  --   --  4.24 4.56 3.64  WBC 15.6* 21.7*  --  27.0* 21.4*    Liver Function Tests: Recent Labs  Lab 02/09/21 0415 02/10/21 0407 02/11/21 0432 02/12/21 0445 02/13/21 0158  ALBUMIN 2.8* 2.7* 2.8* 2.4* 2.4*   No results for input(s): LIPASE, AMYLASE in the last 168 hours. No results for input(s): AMMONIA in the last 168 hours.  ABG    Component Value Date/Time   PHART 7.381 02/12/2021 0549   PCO2ART 36.8 02/12/2021 0549   PO2ART 123 (H)  02/12/2021 0549   HCO3 21.8 02/12/2021 0549   TCO2 23 02/12/2021 0549   ACIDBASEDEF 3.0 (H) 02/12/2021 0549   O2SAT 99.0 02/12/2021 0549     Coagulation Profile: Recent Labs  Lab 02/10/21 0407  INR 1.1    Cardiac Enzymes: No results for input(s): CKTOTAL, CKMB, CKMBINDEX, TROPONINI in the last 168 hours.  HbA1C: Hgb A1c MFr Bld  Date/Time Value Ref Range Status  02/06/2021 02:20 PM 6.4 (H) 4.8 - 5.6 % Final    Comment:    REPEATED TO VERIFY (NOTE) Pre diabetes:          5.7%-6.4%  Diabetes:              >6.4%  Glycemic control for   <7.0% adults with diabetes   12/01/2020 04:32 AM 8.7 (H) 4.8 - 5.6 % Final    Comment:    (NOTE)         Prediabetes: 5.7 - 6.4  Diabetes: >6.4         Glycemic control for adults with diabetes: <7.0     CBG: Recent Labs  Lab 02/12/21 1642 02/12/21 2012 02/12/21 2335 02/13/21 0352 02/13/21 0741  GLUCAP 122* 151* 160* 145* 191*    Review of Systems:   See above  Past Medical History  He,  has a past medical history of Acute ischemic left MCA stroke ([place]) (04/09/2014), Acute respiratory failure with hypoxia ([facility]) (04/09/2014), Aspiration pneumonia ([place]), Asthma, Brain tumor ([place]), Cancer ([place]), Confusion, Diabetes mellitus without complication ([place]), Dizziness, Dyspnea, Epilepsy ([place]), GERD (gastroesophageal reflux disease), Headache, Hyperlipidemia, Hypertension, Peripheral edema, Peripheral neuropathy, Pneumonia (3 yrs ago), Seizures ([place]), Sleep apnea, and Urinary frequency.   Surgical History    Past Surgical History:  Procedure Laterality Date   BIOPSY  09/05/2020   Procedure: BIOPSY;  Surgeon: Irene Shipper, MD;  Location[facility] END[facility]COPY;  Service: Endoscopy;;   BIOPSY  02/06/2021   Procedure: BIOPSY;  Surgeon: Jerene Bears, MD;  Location[facility] ENDOSCOPY;  Service: Gastroenterology;;   CARDIAC CATHETERIZATION  7 yrs ago   COLONOSCOPY WITH PROPOFOL N/A 09/05/2020   Procedure: COLONOSCOPY WITH PROPOFOL;  Surgeon: Irene Shipper, MD;  Location: [facility] ENDOSCOPY;  Service: Endoscopy;  Laterality: N/A;   CRANIOTOMY N/A 09/01/2016   Procedure: CRANIOTOMY TUMOR  LEFT PTERIONAL;  Surgeon: Ashok Pall, MD;  Location: [place];  Service: Neurosurgery;  Laterality: N/A;   cyst removed from chest      as a child   ESOPHAGOGASTRODUODENOSCOPY (EGD) WITH PROPOFOL N/A 02/06/2021   Procedure: ESOPHAGOGASTRODUODENOSCOPY (EGD) WITH PROPOFOL;  Surgeon: Jerene Bears, MD;  Location: [facility] ENDOSCOPY;  Service: Gastroenterology;  Laterality: N/A;   IR FLUORO GUIDE CV LINE LEFT  12/22/2020   IR GASTROSTOMY TUBE MOD SED  02/10/2021   IR US GUIDE VASC ACCESS LEFT  12/22/2020   NO PAST SURGERIES     PARTIAL COLECTOMY N/A 09/10/2020   Procedure: TRANSVERSE COLECTOMY;  Surgeon: [name], MD;  Location: [place];  Service: General;  Laterality: N/A;   POLYPECTOMY  09/05/2020   Procedure: POLYPECTOMY;  Surgeon: Irene Shipper, MD;  Location: [place];  Service: Endoscopy;;   PORTACATH PLACEMENT Right 10/01/2020   Procedure: INSERTION PORT-A-CATH;  Surgeon: [name], MD;  Location: [place];  Service: General;  Laterality: Right;   SUBMUCOSAL TATTOO INJECTION  09/05/2020   Procedure: SUBMUCOSAL TATTOO INJECTION;  Surgeon: Irene Shipper, MD;  Location: [facility] ENDOSCOPY;  Service: Endoscopy;;   TRACHEOSTOMY TUBE PLACEMENT N/A 01/05/2021   Procedure: TRACHEOSTOMY;  Surgeon: [name], MD;  Location: [place];  Service: ENT;  Laterality: N/A;     Social History   reports that he has quit smoking. He has never used smokeless tobacco. He reports that he does not drink alcohol and does not use drugs.   Family History   His family history includes Breast cancer in his maternal aunt and mother; Prostate cancer in his maternal uncle.   Allergies Allergies  Allergen Reactions   Morphine And Related Other (See Comments)    Throat swelling     Home Medications  Prior to Admission medications   Medication Sig Start Date End Date Taking? Authorizing  Provider  acetaminophen (TYLENOL) 500 MG tablet Take 1,000 mg by mouth 3 (three) times daily as needed for headache (pain).   Yes [provider]  albuterol (PROVENTIL HFA;VENTOLIN HFA) 108 (90 BASE) MCG/ACT inhaler Inhale 2 puffs into the lungs every 4 (four) hours  as needed for wheezing or shortness of breath. 04/09/15  Yes Muthersbaugh, Jarrett Soho, PA-C  amLODipine (NORVASC) 10 MG tablet Take 10 mg by mouth daily. 05/11/20  Yes [provider]  capecitabine (XELODA) 500 MG tablet Take 3 tablets (1,500 mg total) by mouth 2 (two) times daily after a meal. 11/19/20  Yes Ladell Pier, MD  gabapentin (NEURONTIN) 300 MG capsule Take 1 capsule (300 mg total) by mouth 3 (three) times daily. 04/06/16  Yes Varney Biles, MD  levETIRAcetam (KEPPRA) 1000 MG tablet Take 1,000 mg by mouth 2 (two) times daily. 07/28/16  Yes [provider]  metFORMIN (GLUCOPHAGE-XR) 500 MG 24 hr tablet Take 500 mg by mouth 2 (two) times daily. 07/27/20  Yes [provider]  omeprazole (PRILOSEC) 20 MG capsule Take 20 mg by mouth daily. 08/10/16  Yes [provider]  atorvastatin (LIPITOR) 20 MG tablet Take 20 mg by mouth daily at 6 PM.    [provider]  lidocaine-prilocaine (EMLA) cream Apply 1 application topically as directed. Apply 1/2 tablespoon to port site 2 hours prior to stick and cover with plastic wrap to numb site Patient not taking: Reported on 10/08/2020 09/24/20   Ladell Pier, MD  ondansetron (ZOFRAN) 8 MG tablet Take 1 tablet (8 mg total) by mouth every 8 (eight) hours as needed for nausea or vomiting. Patient not taking: No sig reported 09/24/20   Ladell Pier, MD  prochlorperazine (COMPAZINE) 10 MG tablet Take 1 tablet (10 mg total) by mouth every 6 (six) hours as needed. Patient not taking: No sig reported 09/24/20   Ladell Pier, MD     Critical care time: Clark, D.O.  Internal Medicine Resident, PGY-3 [facility]  Pager: [phone] 7:54 AM, 02/13/2021

## 2021-02-13 NOTE — Progress Notes (Signed)
Met with pt's wife at bedside per wife's request. Discussed role and process for arranging out-pt HD. Advised wife that local out-pt HD clinics are unable to meet the needs of a pt with a trach. Explained that renal navigator is available to assist with any out-pt HD needs that may arise. Will continue to follow and assist as needed.  Melven Sartorius Renal Navigator [phone]

## 2021-02-13 NOTE — Procedures (Signed)
Tracheostomy Change Note  Patient Details:   Name: Thomas [name] DOB: 01-19-1956 MRN: [id number]    Airway Documentation:     Evaluation  O2 sats: stable throughout Complications: No apparent complications Patient did tolerate procedure well. Bilateral Breath Sounds: Rhonchi, Diminished ETCO2 detector good color change, SPO2 97%, pt able to cough up secretions.      Littie Deeds [facility] 02/13/2021, 11:01 AM

## 2021-02-13 NOTE — Progress Notes (Signed)
[facility] Progress Note   65 year old gentleman with a history of colon cancer status post resection and colostomy May 2022.  Status post 3 cycles of chemotherapy last was in July 2022.  Type 2 diabetes with neuropathy hypertension hyperlipidemia presented to the emergency room 11/30/2020 with abdominal pain.  Was found to be secondary to sepsis.  Patient also was found to have acute kidney injury.  Developed atrial fibrillation with rapid ventricular rate and was admitted to intensive care unit for septic shock.  On 12/03/2020 he developed metabolic encephalopathy with hypoxia and aspiration pneumonia and required intubation 12/06/2020.  He also required pressor support.  He was reintubated since 12/15/2020 tracheostomy placed.  His hospital course  complicated by DVT.  It appears that he has recurrent Pseudomonas pneumonia.  He also has recurrent anemia requiring transfusion of 4 units of packed red blood cells.  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 CRRT initiated 7/31 and transitioned to IHD on 12/19/20.  S/p [facility] TDC placement 12/22/20.  Monitoring for signs of recovery but urine output has been fairly minimal and he remains dialysis dependent on TTS schedule currently - HD tomorrow.  2.Urinary retention   Foley catheter removed 10/3, bladder scans were ok. Has condom cath on now.    3.Volume overload/anasarca-improved with dialysis.   4.Sepsis/aspiration pneumonia/VDRF:aspiration pneumonia.  s/p trach; per HPI acute decompensation 10/5 due to mucus plugging - now improved back on TC   4.Stage IIIB colon cancer - sp partial colectomy, colostomy, completed chemoRx sp 3 cycles in 11/2020 - per notes planned for 4 cycles, 3rd cycle limited by toxicities/status.    5.Right leg DVT: On Eliquis.   6.Atrial fibrillation - on amiodarone and anticoagulation.     7.Anemia of critical illness:   transfuse as needed, holding erythropoietin with malignancy.  Underwent EGD on 9/30.   8. CKD-MBD; started sevelamer for hyperphosphatemia on 9/8, monitor phosphorus level.Currently < 5.5.   9. CVA, incidental:  neuro performed w/u and has signed off - f/u in clinic planned.    9. Disposition -  Seen by palliative care team.  Discussion ongoing for possible transfer to [facility].  Had G tube placed.    Subjective:   HD yesterday in dialysis unit as on TC.  UF 352m.  Net +244 for yesterday.  Had what appeared to be a panic attack yesterday afternoon.  In for transfer out of ICU.  This morning has no specific complaints except ongoing pain at G tube - not worse, not better.   Objective:   BP 113/65   Pulse 91   Temp 97.6 F (36.4 C) (Axillary)   Resp (!) 24   Ht 5' 8"  (1.727 m)   Wt 98.4 kg   SpO2 100%   BMI 32.98 kg/m   Intake/Output Summary (Last 24 hours) at 02/13/2021 0914 Last data filed at 02/13/2021 0800 Gross per 24 hour  Intake 830.16 ml  Output 777 ml  Net 53.16 ml    Weight change: -1.1 kg  Physical Exam: General: Chronically ill-appearing, lying in bed, no distress  Heart: Normal rate Lungs: coarse BL, normal WOB on TC currently Abdomen:soft, Non-tender, non-distended, LUQ G tube dressing intact - grimacing at times from pain Extremities: Minimal edema, warm and well perfused Neurology: Alert awake and following commands  Dialysis Access: [facility] TDC in place.  c/d/i R chest wall port GU: condom draining cloudy urine,  bladder nontender with palpation     Imaging: DG Chest 1 View  Result Date: 02/12/2021 CLINICAL DATA:  Chest pain EXAM: PORTABLE CHEST 1 VIEW COMPARISON:  02/12/2021 FINDINGS: Tracheostomy tube, dialysis catheter and right-sided chest wall port are again seen and stable. Cardiac shadow is within normal limits. Lungs are clear bilaterally. No bony abnormality is noted. IMPRESSION: No active disease. Electronically Signed   By: Inez Catalina M.D.    On: 02/12/2021 19:33   CT CHEST WO CONTRAST  Result Date: 02/11/2021 CLINICAL DATA:  Pneumonia.  Hypoxia. EXAM: CT CHEST WITHOUT CONTRAST TECHNIQUE: Multidetector CT imaging of the chest was performed following the standard protocol without IV contrast. COMPARISON:  December 01, 2020. FINDINGS: Cardiovascular: Atherosclerosis of thoracic aorta is noted without aneurysm formation. Normal cardiac size. No pericardial effusion. Coronary artery calcifications are noted. Mediastinum/Nodes: Tracheostomy tube is in grossly good position. The esophagus is unremarkable. No adenopathy is noted. Thyroid gland is unremarkable. Lungs/Pleura: No pneumothorax is noted. No pleural effusion is noted. Left lower lobe airspace opacity is noted with air bronchograms concerning for pneumonia. Abnormal soft tissue density is noted in the left upper and lower lobe bronchi concerning for aspiration or less likely mucous plugging. Minimal right posterior basilar subsegmental atelectasis is noted. Upper Abdomen: Gastrostomy tube is in grossly good position. Musculoskeletal: No chest wall mass or suspicious bone lesions identified. IMPRESSION: Left lower lobe airspace opacity is noted with air bronchograms consistent with pneumonia. Abnormal soft tissue density is seen occluding the left upper and lower lobe bronchi most consistent with aspiration or less likely mucous plugging. Coronary artery calcifications are noted. Aortic Atherosclerosis (ICD10-I70.0). Electronically Signed   By: Marijo Conception M.D.   On: 02/11/2021 17:14   DG Chest Port 1 View  Result Date: 02/12/2021 CLINICAL DATA:  Pneumonia EXAM: PORTABLE CHEST 1 VIEW COMPARISON:  Chest x-ray dated February 11, 2021 FINDINGS: Visualized cardiac and mediastinal contours are unchanged. Unchanged tracheostomy tube, right chest wall port and left dialysis catheter. Near complete resolution of left lung heterogeneous opacities. No large pleural effusion or evidence of pneumothorax.  IMPRESSION: Near complete resolution of left lung heterogeneous opacities, favor improved atelectasis due to increased lung volumes. Electronically Signed   By: Yetta Glassman M.D.   On: 02/12/2021 08:34   DG CHEST PORT 1 VIEW  Result Date: 02/11/2021 CLINICAL DATA:  Hypoxia. EXAM: PORTABLE CHEST 1 VIEW COMPARISON:  January 29, 2021. FINDINGS: The heart size and mediastinal contours are within normal limits. Tracheostomy tube is unchanged in position. Left internal jugular dialysis catheter is in good position. Feeding tube has been removed. Right internal jugular Port-A-Cath is again noted. Right lung is clear. Increased diffuse left lung opacity is noted concerning for worsening pneumonia or atelectasis with associated left pleural effusion. The visualized skeletal structures are unremarkable. IMPRESSION: Increased diffuse left lung opacity is noted concerning for worsening pneumonia or atelectasis with possible small left pleural effusion. Electronically Signed   By: Marijo Conception M.D.   On: 02/11/2021 12:55    Labs: BMET Recent Labs  Lab 02/07/21 0258 02/08/21 0500 02/09/21 0415 02/10/21 0407 02/11/21 0432 02/12/21 0445 02/12/21 0549 02/13/21 0158  NA 137 138 139 143 138 140 144 140  K 3.7 3.4* 3.5 3.5 4.5 4.6 4.5 3.9  CL 102 101 103 108 101 102  --  103  CO2 19* 24 22 23 24  21*  --  22  GLUCOSE 134* 165* 165* 158* 192* 232*  --  124*  BUN 32*  28* 46* 58* 31* 45*  --  45*  CREATININE 4.22* 4.13* 5.47* 6.26* 4.32* 5.88*  --  5.23*  CALCIUM 9.0 9.0 9.1 9.1 8.7* 8.9  --  8.7*  PHOS 2.3* 4.2 3.8 3.8 2.7 5.0*  --  4.1    CBC Recent Labs  Lab 02/10/21 0407 02/11/21 1217 02/12/21 0445 02/12/21 0549 02/13/21 0158  WBC 15.6* 21.7* 27.0*  --  21.4*  NEUTROABS  --  16.1*  --   --   --   HGB 8.1* 8.8* 8.8* 8.8* 8.4*  HCT 24.2* 27.3* 26.7* 26.0* 25.7*  MCV 84.9 86.4 87.3  --  86.2  PLT 323 286 253  --  256     Medications:     apixaban  5 mg Per Tube BID   chlorhexidine  gluconate (MEDLINE KIT)  15 mL Mouth Rinse BID   Chlorhexidine Gluconate Cloth  6 each Topical Q0600   clonazePAM  1 mg Per Tube Q8H   docusate  100 mg Per Tube BID   famotidine  20 mg Per Tube BID   feeding supplement (PROSource TF)  45 mL Per Tube BID   fentaNYL (SUBLIMAZE) injection  100 mcg Intravenous Once   hydrocerin   Topical BID   insulin aspart  0-15 Units Subcutaneous Q4H   insulin detemir  10 Units Subcutaneous Daily   levETIRAcetam  500 mg Per Tube BID   mouth rinse  15 mL Mouth Rinse 10 times per day   nystatin  5 mL Mouth/Throat QID   pantoprazole sodium  40 mg Per Tube BID   polyethylene glycol  17 g Per Tube Daily     Justin Mend  02/13/2021, 9:14 AM

## 2021-02-14 ENCOUNTER — Inpatient Hospital Stay (HOSPITAL_COMMUNITY): Payer: Medicare HMO

## 2021-02-14 DIAGNOSIS — J9601 Acute respiratory failure with hypoxia: Secondary | ICD-10-CM | POA: Diagnosis not present

## 2021-02-14 DIAGNOSIS — E1100 Type 2 diabetes mellitus with hyperosmolarity without nonketotic hyperglycemic-hyperosmolar coma (NKHHC): Secondary | ICD-10-CM | POA: Diagnosis not present

## 2021-02-14 DIAGNOSIS — D631 Anemia in chronic kidney disease: Secondary | ICD-10-CM

## 2021-02-14 DIAGNOSIS — N171 Acute kidney failure with acute cortical necrosis: Secondary | ICD-10-CM | POA: Diagnosis not present

## 2021-02-14 DIAGNOSIS — N189 Chronic kidney disease, unspecified: Secondary | ICD-10-CM

## 2021-02-14 DIAGNOSIS — I4891 Unspecified atrial fibrillation: Secondary | ICD-10-CM | POA: Diagnosis not present

## 2021-02-14 LAB — CULTURE, RESPIRATORY W GRAM STAIN

## 2021-02-14 LAB — CBC
HCT: 22.1 % — ABNORMAL LOW (ref 39.0–52.0)
Hemoglobin: 7.1 g/dL — ABNORMAL LOW (ref 13.0–17.0)
MCH: 27.8 pg (ref 26.0–34.0)
MCHC: 32.1 g/dL (ref 30.0–36.0)
MCV: 86.7 fL (ref 80.0–100.0)
Platelets: 272 10*3/uL (ref 150–400)
RBC: 2.55 MIL/uL — ABNORMAL LOW (ref 4.22–5.81)
RDW: 15 % (ref 11.5–15.5)
WBC: 17.8 10*3/uL — ABNORMAL HIGH (ref 4.0–10.5)
nRBC: 0 % (ref 0.0–0.2)

## 2021-02-14 LAB — GLUCOSE, CAPILLARY
Glucose-Capillary: 128 mg/dL — ABNORMAL HIGH (ref 70–99)
Glucose-Capillary: 148 mg/dL — ABNORMAL HIGH (ref 70–99)
Glucose-Capillary: 152 mg/dL — ABNORMAL HIGH (ref 70–99)
Glucose-Capillary: 153 mg/dL — ABNORMAL HIGH (ref 70–99)
Glucose-Capillary: 188 mg/dL — ABNORMAL HIGH (ref 70–99)
Glucose-Capillary: 188 mg/dL — ABNORMAL HIGH (ref 70–99)
Glucose-Capillary: 213 mg/dL — ABNORMAL HIGH (ref 70–99)

## 2021-02-14 LAB — RENAL FUNCTION PANEL
Albumin: 2.1 g/dL — ABNORMAL LOW (ref 3.5–5.0)
Anion gap: 18 — ABNORMAL HIGH (ref 5–15)
BUN: 61 mg/dL — ABNORMAL HIGH (ref 8–23)
CO2: 20 mmol/L — ABNORMAL LOW (ref 22–32)
Calcium: 8.7 mg/dL — ABNORMAL LOW (ref 8.9–10.3)
Chloride: 99 mmol/L (ref 98–111)
Creatinine, Ser: 5.73 mg/dL — ABNORMAL HIGH (ref 0.61–1.24)
GFR, Estimated: 10 mL/min — ABNORMAL LOW (ref >60)
Glucose, Bld: 222 mg/dL — ABNORMAL HIGH (ref 70–99)
Phosphorus: 4.5 mg/dL (ref 2.5–4.6)
Potassium: 4 mmol/L (ref 3.5–5.1)
Sodium: 137 mmol/L (ref 135–145)

## 2021-02-14 LAB — PREPARE RBC (CROSSMATCH)

## 2021-02-14 MED ORDER — ORAL CARE MOUTH RINSE
15.0000 mL | Freq: Two times a day (BID) | OROMUCOSAL | Status: DC
  Administered 2021-02-15 – 2021-03-12 (×47): 15 mL via OROMUCOSAL

## 2021-02-14 MED ORDER — QUETIAPINE FUMARATE 25 MG PO TABS
25.0000 mg | ORAL_TABLET | Freq: Two times a day (BID) | ORAL | Status: DC
  Administered 2021-02-14 – 2021-02-24 (×22): 25 mg via ORAL
  Filled 2021-02-14 (×23): qty 1

## 2021-02-14 MED ORDER — SODIUM CHLORIDE 0.9% IV SOLUTION: Freq: Once | INTRAVENOUS | Status: AC

## 2021-02-14 MED ORDER — CHLORHEXIDINE GLUCONATE 0.12 % MT SOLN
15.0000 mL | Freq: Two times a day (BID) | OROMUCOSAL | Status: DC
  Administered 2021-02-15 – 2021-03-13 (×51): 15 mL via OROMUCOSAL
  Filled 2021-02-14 (×45): qty 15

## 2021-02-14 NOTE — Progress Notes (Signed)
RT called to room for PT pulling out Tracheostomy tube, upon arrival Trach was reinserted and placement was checked via breath sounds, and c02 detector. PT in no distress at this time, RT will continue to monitor, final placement verification will be checked via chest x-ray.

## 2021-02-14 NOTE — Progress Notes (Signed)
[facility] Progress Note   65 year old gentleman with a history of colon cancer status post resection and colostomy May 2022.  Status post 3 cycles of chemotherapy last was in July 2022.  Type 2 diabetes with neuropathy hypertension hyperlipidemia presented to the emergency room 11/30/2020 with abdominal pain.  Was found to be secondary to sepsis.  Patient also was found to have acute kidney injury.  Developed atrial fibrillation with rapid ventricular rate and was admitted to intensive care unit for septic shock.  On 12/03/2020 he developed metabolic encephalopathy with hypoxia and aspiration pneumonia and required intubation 12/06/2020.  He also required pressor support.  He was reintubated since 12/15/2020 tracheostomy placed.  His hospital course  complicated by DVT.  It appears that he has recurrent Pseudomonas pneumonia.  He also has recurrent anemia requiring transfusion of 4 units of packed red blood cells.  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 CRRT initiated 7/31 and transitioned to IHD on 12/19/20.  S/p [facility] TDC placement 12/22/20.  Monitoring for signs of recovery but urine output has been fairly minimal and he remains dialysis dependent on TTS schedule currently - HD was planned today but he has declined today despite knowledge this treatment is needed.  He seems really tired overall and it might be time to re involve palliative care, particularly if he continues to decline dialysis.   2.Urinary retention   Foley catheter removed 10/3, bladder scans have been ok   3.Volume overload/anasarca-improved with dialysis.   4.Sepsis/aspiration pneumonia/VDRF:aspiration pneumonia.  s/p trach; per HPI acute decompensation 10/5 due to mucus plugging - now improved back on TC   4.Stage IIIB colon cancer - sp partial colectomy, colostomy, completed chemoRx sp 3 cycles in 11/2020 - per notes  planned for 4 cycles, 3rd cycle limited by toxicities/status.    5.Right leg DVT: On Eliquis.   6.Atrial fibrillation - on amiodarone and anticoagulation.     7.Anemia of critical illness:  transfuse as needed, holding erythropoietin with malignancy.  Underwent EGD on 9/30.   8. CKD-MBD; started sevelamer for hyperphosphatemia on 9/8, monitor phosphorus level.Currently < 5.5.   9. CVA, incidental:  neuro performed w/u and has signed off - f/u in clinic planned.    9. Disposition -  Seen by palliative care team.  Discussion ongoing for possible transfer to [facility].  Had G tube placed.  Per above declining dialysis and seems really fatigued - may need to readdress GOC in the coming days =- d/w him at bedside today; for now continue current scope of care.   Subjective:   Still in for transfer out of ICU; stable on TC.   This AM he is complaining of continued pain at G tube site without obvious etiology.  He is refusing dialysis - saying it's "horrible" to lay for the duration of the HD treatment saying back pain, headache, fatigue.   We had a long conversation with RN involved - he declines any dialysis today despite medical necessity.  We agreed to having ongoing conversations re: dialysis.   Objective:   BP 94/62   Pulse 90   Temp 97.9 F (36.6 C) (Axillary)   Resp (!) 27   Ht 5' 8"  (1.727 m)   Wt 99.3 kg   SpO2 99%   BMI 33.29 kg/m   Intake/Output Summary (Last 24 hours) at 02/14/2021 1039 Last data filed at 02/14/2021 0900 Gross per  24 hour  Intake 5172.57 ml  Output 250 ml  Net 4922.57 ml    Weight change: 2.3 kg  Physical Exam: General: Chronically ill-appearing, lying in bed, no distress  Heart: Normal rate Lungs: coarse BL, normal WOB on TC currently Abdomen:soft, Non-tender, non-distended, LUQ G tube dressing intact - grimacing at times from pain Extremities: Minimal edema, warm and well perfused Neurology: Alert awake and following commands  Dialysis Access:  [facility] TDC in place.  c/d/i R chest wall port GU: per RN bladder scan this AM 277m and voided some.      Imaging: DG Chest 1 View  Result Date: 02/12/2021 CLINICAL DATA:  Chest pain EXAM: PORTABLE CHEST 1 VIEW COMPARISON:  02/12/2021 FINDINGS: Tracheostomy tube, dialysis catheter and right-sided chest wall port are again seen and stable. Cardiac shadow is within normal limits. Lungs are clear bilaterally. No bony abnormality is noted. IMPRESSION: No active disease. Electronically Signed   By: MInez CatalinaM.D.   On: 02/12/2021 19:33   DG CHEST PORT 1 VIEW  Result Date: 02/14/2021 CLINICAL DATA:  65year old male with history of tracheostomy. EXAM: PORTABLE CHEST 1 VIEW COMPARISON:  Chest x-ray 02/12/2021. FINDINGS: A tracheostomy tube is in place with tip 3.6 cm above the carina. Right internal jugular Port-A-Cath with tip terminating in the superior aspect of the right atrium. Left internal jugular PermCath with tip terminating at the superior cavoatrial junction. Lung volumes are low. No consolidative airspace disease. No pleural effusions. No pneumothorax. No pulmonary nodule or mass noted. Pulmonary vasculature and the cardiomediastinal silhouette are within normal limits. Atherosclerotic calcifications in the thoracic aorta. IMPRESSION: 1. Support apparatus, as above. 2. Low lung volumes without radiographic evidence of acute cardiopulmonary disease. 3. Aortic atherosclerosis. Electronically Signed   By: DVinnie LangtonM.D.   On: 02/14/2021 05:14    Labs: BMET Recent Labs  Lab 02/08/21 0500 02/09/21 0415 02/10/21 0407 02/11/21 0432 02/12/21 0445 02/12/21 0549 02/13/21 0158 02/14/21 0304  NA 138 139 143 138 140 144 140 137  K 3.4* 3.5 3.5 4.5 4.6 4.5 3.9 4.0  CL 101 103 108 101 102  --  103 99  CO2 24 22 23 24  21*  --  22 20*  GLUCOSE 165* 165* 158* 192* 232*  --  124* 222*  BUN 28* 46* 58* 31* 45*  --  45* 61*  CREATININE 4.13* 5.47* 6.26* 4.32* 5.88*  --  5.23* 5.73*  CALCIUM  9.0 9.1 9.1 8.7* 8.9  --  8.7* 8.7*  PHOS 4.2 3.8 3.8 2.7 5.0*  --  4.1 4.5    CBC Recent Labs  Lab 02/11/21 1217 02/12/21 0445 02/12/21 0549 02/13/21 0158 02/14/21 0304  WBC 21.7* 27.0*  --  21.4* 17.8*  NEUTROABS 16.1*  --   --   --   --   HGB 8.8* 8.8* 8.8* 8.4* 7.1*  HCT 27.3* 26.7* 26.0* 25.7* 22.1*  MCV 86.4 87.3  --  86.2 86.7  PLT 286 253  --  256 272     Medications:     sodium chloride   Intravenous Once   chlorhexidine gluconate (MEDLINE KIT)  15 mL Mouth Rinse BID   Chlorhexidine Gluconate Cloth  6 each Topical Q0600   clonazePAM  1 mg Per Tube Q8H   docusate  100 mg Per Tube BID   famotidine  20 mg Per Tube BID   feeding supplement (PROSource TF)  45 mL Per Tube BID   fentaNYL (SUBLIMAZE) injection  100 mcg Intravenous  Once   guaiFENesin  10 mL Oral Q6H   hydrocerin   Topical BID   insulin aspart  0-15 Units Subcutaneous Q4H   insulin detemir  10 Units Subcutaneous Daily   levETIRAcetam  500 mg Per Tube BID   mouth rinse  15 mL Mouth Rinse 10 times per day   mupirocin ointment  1 application Nasal BID   nystatin  5 mL Mouth/Throat QID   pantoprazole sodium  40 mg Per Tube BID   polyethylene glycol  17 g Per Tube Daily   QUEtiapine  25 mg Oral BID   sodium chloride HYPERTONIC  4 mL Nebulization BID     Justin Mend  02/14/2021, 10:39 AM

## 2021-02-14 NOTE — Progress Notes (Signed)
PROGRESS NOTE    Thomas [name]  WIO:[id number] DOB: 1955-08-18 DOA: 11/30/2020 PCP: [name], NP    Chief Complaint  Patient presents with   Abdominal Pain    Brief Narrative:   RUXIN RANSOME is a 65 y.o. with a pertinent PMH of CVA, colon cancer s/p colectomy/colostomy, T2DM, ESRD (on HD), Epilepsy, HTN, HLD, OSA, DVT (lovenox), who presented in August  after recent surgery for colon cancer s/p resection with signs and symptoms of sepsis and worsening respiratory distress  admitted for aspiration pneumonia. Hospital course complicated by ischemic colitis and prolonged respiratory failure requiring tracheostomy, patient with very complicated hospital stay, please see below.       7/27 Transferred to [facility], stopped abx as sepsis resolved/ruled out 7/30 AM Worsened AME, more hypoxic with worsening renal funciton, new aspiration on abx restarted 7/30 PM Pt with increased o2 requirement on 100% FIO2 on BIPAP, d/w family who wanted to proceed with intubation, pt intubated 7/31 Tracheal aspirate grew moderate candida tropicalis  8/1 Some issues with tachycardia and hypotension overnight. Reproted high ostomy output.  GI PCR negative 8/2 down to 9 mcg of Levophed, however repeat CT abdomen/pelvis showed inflammatory wall thickening and fat stranding of the descending and sigmoid colon, new since prior, consistent with nonspecific infectious, inflammatory, or ischemic colitis.  Also new dense consolidative airspace opacity in the right lower and right middle lobes 8/3 further weaning NE. Weaning sedation. Full code, full scope per family meeting 8/2. Eraxis stopped (8/1-8/3) 8/4 off pressors, plt down to 103.  8/5 remains off pressors. Moving around more, not really following commands though  8/9 failed extubation 8/8, tolerated nasal cannula for about an hour, desaturations and inability to clear secretions-reintubated 8/8 8/10 some agitation overnight 8/11 tracheostomy placement On trach collar  since 8/16 8/22 pulled trach out, replaced under bronch guidance. Has proximal area of granulation tissue that is above the bottom of the trach tube.  8/25 Removed Trach, unable to reinsert, despite multiple attempts re-intubated 8/29 Anemia, 1 unit PRBC. ENT re-placed Trach in [employer]. 9/8 tracheal aspirate growing pan-sensitive P. Aeruginosa. Started on Ceftazidine  9/19 Continues to have thick secretions.  9/30 EGD significant for duodenal ulcer, nonbleeding. 10/5 had respiratory distress and increase in O2 requirements, frequent suctioning. Ct chest showed large mucus plug in the Lft upper/lower lobes, transferred to the ICU for mechanical ventilation. 10/6 wean sedation and SBT via trach collar 10/7, patient pulled his trach was inserted by RT with no complications.    Assessment & Plan:   Principal Problem:   Diabetes mellitus with hyperosmolarity without hyperglycemic hyperosmolar nonketotic coma (HCC) Active Problems:   Lactic acidosis   Acute renal failure superimposed on stage 2 chronic kidney disease (HCC)   Metabolic acidosis   Tachycardia   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 (HCC)   Adverse effect of chemotherapy   Pressure injury of skin   Atrial fibrillation with RVR (HCC)   Sepsis (HCC)   Malnutrition of moderate degree   Acute metabolic encephalopathy   Acute respiratory failure with hypoxemia (HCC)   Acute renal failure with acute cortical necrosis (HCC)   Shock ([place])   Status post tracheostomy ([place])   Endotracheal tube present   Hemodialysis status ([place])   Palliative care by specialist   SOB (shortness of breath)   Non-intractable vomiting   Abdominal pain, epigastric   Cerebral embolism with cerebral infarction   Acute esophagitis   Duodenal ulcer disease   Acute  hypoxic respiratory failure status post tracheostomy -Pulled his tracheostomy multiple times, his tracheostomy is complicated by intratracheal granulation tissue, with  difficult to place trach, ENT changed trach on 8/29 to cuffless trach. -Patient with aspiration with mucous plug, s/p bronchoscopy with resolution of his mucous plug, antibiotics per PCCM, he is on day 3 vancomycin and Zosyn, BAL showing gram-negative rods and gram-positive cocci.  Procalcitonin trending down. -Wean oxygen as tolerated, continue with pulmonary hygiene. -Patient pulled his trach again overnight, was reinserted by RT with no significant complications. -Patient remains with significant fluctuating mental status, I will add Seroquel to his Klonopin.  Dysphagia/reflux esophagitis/nonbleeding duodenal ulcers -EGD 9/30 with the above findings difficult for esophagitis and nonbleeding duodenal ulcer.,  No H. pylori identified on biopsy. -Patient needs to continue PPI for total of 4-weeks (initiated 9/29)-have transitioned Protonix and Pepcid to oral route -For now continue scheduled Zofran -Follow-up EGD recommended after discharge -Patient with dysphagia, failed MBBS, PEG inserted 10/4.  MRI with bilateral punctate lesion (Frontal watershed) -SLP evaluated and noted patient is unable to elevate tongue properly bilaterally-continues to have difficulty with swallowing and phonation -Imaging consistent with acute infarcts -Stroke team following-etiology of infarcts yet to be determined-differential includes thrombotic etiology from either A. fib or from DVT if has PFO  Acute extensive right leg DVT:  -On anticoagulation, will hold for now given drop in hemoglobin requiring transfusion,   New onset atrial fibrillation with RVR:  -On amiodarone and anticoagulation.  To resume Eliquis in a.m. if hemoglobin remains stable. -Currently maintaining sinus rhythm w/o meds  CVA (incidental -Neuro Signed off  ESRD -HD per renal  Anemia -This is most likely anemia of chronic kidney disease, but he had hemoglobin 7.1 this morning, no evidence of any bleeding, normal color stools in  colostomy bag, will transfuse 1 unit PRBC, will hold Eliquis for next 24 hours.    DVT prophylaxis: Eliquis Code Status: Full Family Communication: None at bedside Disposition:   Status is: Inpatient  Remains inpatient appropriate because:Unsafe d/c plan  Dispo: The patient is from: Home              Anticipated d/c is to: SNF              Patient currently is not medically stable to d/c.   Difficult to place patient Yes    Consultants: PCCM General surgery Nephrology Hematology/oncology Palliative care medicine team   Procedures: 2u PRBC 7/31, 1u PRBC 8/17, 1u PRBC 8/27 Tracheostomy revision 01/05/2021 Dr. Constance Holster [facility] 8/22        Subjective:  Patient pulled his trach overnight, which was reinserted by RT overnight with no complications, otherwise no significant events as discussed with staff, patient denies any complaints today.     Objective: Vitals:   02/14/21 0900 02/14/21 1000 02/14/21 1114 02/14/21 1129  BP: 121/71 94/62  131/68  Pulse: 88 90  91  Resp: 14 (!) _0 Temp:      TempSrc:      SpO2: 100% 99%  99%  Weight:      Height:        Intake/Output Summary (Last 24 hours) at 02/14/2021 1153 Last data filed at 02/14/2021 1100 Gross per 24 hour  Intake 5172.57 ml  Output 375 ml  Net 4797.57 ml   Filed Weights   02/12/21 1530 02/13/21 0600 02/14/21 0500  Weight: 97 kg 98.4 kg 99.3 kg    Examination:  Awake Alert, he is more appropriate and communicative  today, answering yes and no questions, and follow some commands.   Colostomy present Symmetrical Chest wall movement, Good air movement bilaterally, managed at the bases. RRR,No Gallops,Rubs or new Murmurs, No Parasternal Heave +ve B.Sounds, Abd Soft, No tenderness, present, colostomy bag present with normal colored stools. No Cyanosis, Clubbing or edema, No new Rash or bruise      Data Reviewed: I have personally reviewed following labs and imaging studies  CBC: Recent Labs  Lab  02/10/21 0407 02/11/21 1217 02/12/21 0445 02/12/21 0549 02/13/21 0158 02/14/21 0304  WBC 15.6* 21.7* 27.0*  --  21.4* 17.8*  NEUTROABS  --  16.1*  --   --   --   --   HGB 8.1* 8.8* 8.8* 8.8* 8.4* 7.1*  HCT 24.2* 27.3* 26.7* 26.0* 25.7* 22.1*  MCV 84.9 86.4 87.3  --  86.2 86.7  PLT 323 286 253  --  256 169    Basic Metabolic Panel: Recent Labs  Lab 02/08/21 0500 02/09/21 0415 02/10/21 0407 02/11/21 0432 02/12/21 0445 02/12/21 0549 02/13/21 0158 02/14/21 0304  NA 138 139 143 138 140 144 140 137  K 3.4* 3.5 3.5 4.5 4.6 4.5 3.9 4.0  CL 101 103 108 101 102  --  103 99  CO2 _0 21*  --  22 20*  GLUCOSE 165* 165* 158* 192* 232*  --  124* 222*  BUN 28* 46* 58* 31* 45*  --  45* 61*  CREATININE 4.13* 5.47* 6.26* 4.32* 5.88*  --  5.23* 5.73*  CALCIUM 9.0 9.1 9.1 8.7* 8.9  --  8.7* 8.7*  MG 1.8 1.9 1.9  --   --   --   --   --   PHOS 4.2 3.8 3.8 2.7 5.0*  --  4.1 4.5    GFR: Estimated Creatinine Clearance: 14.9 mL/min (A) (by C-G formula based on SCr of 5.73 mg/dL (H)).  Liver Function Tests: Recent Labs  Lab 02/10/21 0407 02/11/21 0432 02/12/21 0445 02/13/21 0158 02/14/21 0304  ALBUMIN 2.7* 2.8* 2.4* 2.4* 2.1*    CBG: Recent Labs  Lab 02/13/21 2030 02/14/21 0004 02/14/21 0310 02/14/21 0720 02/14/21 1106  GLUCAP 122* 188* 213* 153* 128*     Recent Results (from the past 240 hour(s))  Culture, Respiratory w Gram Stain     Status: None (Preliminary result)   Collection Time: 02/11/21 12:24 PM   Specimen: Tracheal Aspirate; Respiratory  Result Value Ref Range Status   Specimen Description TRACHEAL ASPIRATE  Final   Special Requests NONE  Final   Gram Stain   Final    ABUNDANT WBC PRESENT,BOTH PMN AND MONONUCLEAR FEW GRAM NEGATIVE RODS RARE GRAM POSITIVE COCCI    Culture   Final    MODERATE GRAM NEGATIVE RODS IDENTIFICATION AND SUSCEPTIBILITIES TO FOLLOW Performed at [facility], [place] [address] [place], [place] [address]    Report Status  PENDING  Incomplete         Radiology Studies: DG Chest 1 View  Result Date: 02/12/2021 CLINICAL DATA:  Chest pain EXAM: PORTABLE CHEST 1 VIEW COMPARISON:  02/12/2021 FINDINGS: Tracheostomy tube, dialysis catheter and right-sided chest wall port are again seen and stable. Cardiac shadow is within normal limits. Lungs are clear bilaterally. No bony abnormality is noted. IMPRESSION: No active disease. Electronically Signed   By: Inez Catalina M.D.   On: 02/12/2021 19:33   DG CHEST PORT 1 VIEW  Result Date: 02/14/2021 CLINICAL DATA:  65 year old male with history of tracheostomy.  EXAM: PORTABLE CHEST 1 VIEW COMPARISON:  Chest x-ray 02/12/2021. FINDINGS: A tracheostomy tube is in place with tip 3.6 cm above the carina. Right internal jugular Port-A-Cath with tip terminating in the superior aspect of the right atrium. Left internal jugular PermCath with tip terminating at the superior cavoatrial junction. Lung volumes are low. No consolidative airspace disease. No pleural effusions. No pneumothorax. No pulmonary nodule or mass noted. Pulmonary vasculature and the cardiomediastinal silhouette are within normal limits. Atherosclerotic calcifications in the thoracic aorta. IMPRESSION: 1. Support apparatus, as above. 2. Low lung volumes without radiographic evidence of acute cardiopulmonary disease. 3. Aortic atherosclerosis. Electronically Signed   By: Vinnie Langton M.D.   On: 02/14/2021 05:14        Scheduled Meds:  sodium chloride   Intravenous Once   chlorhexidine gluconate (MEDLINE KIT)  15 mL Mouth Rinse BID   Chlorhexidine Gluconate Cloth  6 each Topical Q0600   clonazePAM  1 mg Per Tube Q8H   docusate  100 mg Per Tube BID   famotidine  20 mg Per Tube BID   feeding supplement (PROSource TF)  45 mL Per Tube BID   fentaNYL (SUBLIMAZE) injection  100 mcg Intravenous Once   guaiFENesin  10 mL Oral Q6H   hydrocerin   Topical BID   insulin aspart  0-15 Units Subcutaneous Q4H   insulin  detemir  10 Units Subcutaneous Daily   levETIRAcetam  500 mg Per Tube BID   mouth rinse  15 mL Mouth Rinse 10 times per day   mupirocin ointment  1 application Nasal BID   nystatin  5 mL Mouth/Throat QID   pantoprazole sodium  40 mg Per Tube BID   polyethylene glycol  17 g Per Tube Daily   QUEtiapine  25 mg Oral BID   sodium chloride HYPERTONIC  4 mL Nebulization BID   Continuous Infusions:  sodium chloride Stopped (12/20/20 0955)   anticoagulant sodium citrate     feeding supplement (NEPRO CARB STEADY) 1,000 mL (02/14/21 1130)   piperacillin-tazobactam (ZOSYN)  IV 2.25 g (02/14/21 1131)   promethazine (PHENERGAN) injection (IM or IVPB) 12.5 mg (02/05/21 0416)     LOS: 27 days       Phillips Climes, MD [facility]   To contact the attending provider between 7A-7P or the covering provider during after hours 7P-7A, please log into the web site [email or web address] and access using universal [place] password for that web site. If you do not have the password, please call the hospital operator.  02/14/2021, 11:53 AM

## 2021-02-14 NOTE — Progress Notes (Signed)
Around 0230 went in patient room found that he had removed his trach was not in any distress 02 sats 100% RT called trach replaced and stat xray ordered

## 2021-02-15 DIAGNOSIS — R338 Other retention of urine: Secondary | ICD-10-CM | POA: Diagnosis not present

## 2021-02-15 DIAGNOSIS — N179 Acute kidney failure, unspecified: Secondary | ICD-10-CM | POA: Diagnosis not present

## 2021-02-15 DIAGNOSIS — J9601 Acute respiratory failure with hypoxia: Secondary | ICD-10-CM | POA: Diagnosis not present

## 2021-02-15 LAB — RENAL FUNCTION PANEL
Albumin: 2.1 g/dL — ABNORMAL LOW (ref 3.5–5.0)
Anion gap: 14 (ref 5–15)
BUN: 78 mg/dL — ABNORMAL HIGH (ref 8–23)
CO2: 23 mmol/L (ref 22–32)
Calcium: 8.9 mg/dL (ref 8.9–10.3)
Chloride: 102 mmol/L (ref 98–111)
Creatinine, Ser: 6.08 mg/dL — ABNORMAL HIGH (ref 0.61–1.24)
GFR, Estimated: 10 mL/min — ABNORMAL LOW (ref >60)
Glucose, Bld: 160 mg/dL — ABNORMAL HIGH (ref 70–99)
Phosphorus: 5.2 mg/dL — ABNORMAL HIGH (ref 2.5–4.6)
Potassium: 3.9 mmol/L (ref 3.5–5.1)
Sodium: 139 mmol/L (ref 135–145)

## 2021-02-15 LAB — TYPE AND SCREEN
ABO/RH(D): B POS
Antibody Screen: NEGATIVE
Unit division: 0

## 2021-02-15 LAB — GLUCOSE, CAPILLARY
Glucose-Capillary: 118 mg/dL — ABNORMAL HIGH (ref 70–99)
Glucose-Capillary: 153 mg/dL — ABNORMAL HIGH (ref 70–99)
Glucose-Capillary: 171 mg/dL — ABNORMAL HIGH (ref 70–99)
Glucose-Capillary: 175 mg/dL — ABNORMAL HIGH (ref 70–99)
Glucose-Capillary: 186 mg/dL — ABNORMAL HIGH (ref 70–99)
Glucose-Capillary: 199 mg/dL — ABNORMAL HIGH (ref 70–99)

## 2021-02-15 LAB — CBC
HCT: 25.2 % — ABNORMAL LOW (ref 39.0–52.0)
Hemoglobin: 8.6 g/dL — ABNORMAL LOW (ref 13.0–17.0)
MCH: 29.2 pg (ref 26.0–34.0)
MCHC: 34.1 g/dL (ref 30.0–36.0)
MCV: 85.4 fL (ref 80.0–100.0)
Platelets: 285 10*3/uL (ref 150–400)
RBC: 2.95 MIL/uL — ABNORMAL LOW (ref 4.22–5.81)
RDW: 14.8 % (ref 11.5–15.5)
WBC: 16.7 10*3/uL — ABNORMAL HIGH (ref 4.0–10.5)
nRBC: 0 % (ref 0.0–0.2)

## 2021-02-15 LAB — BPAM RBC
Blood Product Expiration Date: 202210142359
ISSUE DATE / TIME: 202210081402
Unit Type and Rh: 7300

## 2021-02-15 MED ORDER — APIXABAN 5 MG PO TABS
5.0000 mg | ORAL_TABLET | Freq: Two times a day (BID) | ORAL | Status: DC
  Administered 2021-02-15 – 2021-02-24 (×20): 5 mg via ORAL
  Filled 2021-02-15 (×19): qty 1

## 2021-02-15 NOTE — Progress Notes (Signed)
[facility] Progress Note   65 year old gentleman with a history of colon cancer status post resection and colostomy May 2022.  Status post 3 cycles of chemotherapy last was in July 2022.  Type 2 diabetes with neuropathy hypertension hyperlipidemia presented to the emergency room 11/30/2020 with abdominal pain.  Was found to be secondary to sepsis.  Patient also was found to have acute kidney injury.  Developed atrial fibrillation with rapid ventricular rate and was admitted to intensive care unit for septic shock.  On 12/03/2020 he developed metabolic encephalopathy with hypoxia and aspiration pneumonia and required intubation 12/06/2020.  He also required pressor support.  He was reintubated since 12/15/2020 tracheostomy placed.  His hospital course  complicated by DVT.  It appears that he has recurrent Pseudomonas pneumonia.  He also has recurrent anemia requiring transfusion of 4 units of packed red blood cells.  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 CRRT initiated 7/31 and transitioned to IHD on 12/19/20.  S/p LIJ TDC placement 12/22/20.  Monitoring for signs of recovery but urine output has been fairly minimal and he remains dialysis dependent on TTS schedule currently - HD was planned Sat but he declined despite knowledge this treatment is needed.  He seems really tired overall and it might be time to re involve palliative care, particularly if he continues to decline dialysis.  We left it as dialysis will be ordered for tomorrow and he will think about it today if he will accept.    2.Urinary retention   Foley catheter removed 10/3, bladder scans have been ok   3.Volume overload/anasarca-improved with dialysis.   4.Sepsis/aspiration pneumonia/VDRF:aspiration pneumonia.  s/p trach; per HPI acute decompensation 10/5 due to mucus plugging - now improved back on TC   4.Stage IIIB colon  cancer - sp partial colectomy, colostomy, completed chemoRx sp 3 cycles in 11/2020 - per notes planned for 4 cycles, 3rd cycle limited by toxicities/status.    5.Right leg DVT: On Eliquis.   6.Atrial fibrillation - on amiodarone and anticoagulation.     7.Anemia of critical illness:  transfuse as needed, holding erythropoietin with malignancy.  Underwent EGD on 9/30.   8. CKD-MBD; started sevelamer for hyperphosphatemia on 9/8, monitor phosphorus level.Currently < 5.5.   9. CVA, incidental:  neuro performed w/u and has signed off - f/u in clinic planned.    9. Disposition -  Has been seen by palliative care team.  Discussion ongoing for possible transfer to [facility].  Had G tube placed.  Per above declined dialysis and seems really fatigued - may need to readdress GOC in the coming days =- d/w him at bedside today; for now continue current scope of care.   Subjective:   Still in for transfer out of ICU; stable on TC.   Refused dialysis yesterday and says wants to think about it for tomorrow.   Per overnight RN he just seems really worn out.    Objective:   BP 107/63   Pulse 87   Temp 97.8 F (36.6 C) (Oral)   Resp (!) 21   Ht 5\' 8"  (1.727 m)   Wt 99.7 kg   SpO2 100%   BMI 33.42 kg/m   Intake/Output Summary (Last 24 hours) at 02/15/2021 0752 Last data filed at 02/15/2021 0600 Gross per 24 hour  Intake 1666.59 ml  Output 925 ml  Net 741.59 ml    Weight change:  0.4 kg  Physical Exam: General: Chronically ill-appearing, lying in bed, no distress  Heart: Normal rate Lungs: coarse BL, normal WOB on TC currently Abdomen:soft, mildly-tender as it has been, non-distended, LUQ G tube dressing intact Extremities: Minimal edema, warm and well perfused Neurology: Alert awake and following commands  Dialysis Access: LIJ TDC in place.  c/d/i R chest wall port GU: no foley  Imaging: DG CHEST PORT 1 VIEW  Result Date: 02/14/2021 CLINICAL DATA:  65 year old male with history of  tracheostomy. EXAM: PORTABLE CHEST 1 VIEW COMPARISON:  Chest x-ray 02/12/2021. FINDINGS: A tracheostomy tube is in place with tip 3.6 cm above the carina. Right internal jugular Port-A-Cath with tip terminating in the superior aspect of the right atrium. Left internal jugular PermCath with tip terminating at the superior cavoatrial junction. Lung volumes are low. No consolidative airspace disease. No pleural effusions. No pneumothorax. No pulmonary nodule or mass noted. Pulmonary vasculature and the cardiomediastinal silhouette are within normal limits. Atherosclerotic calcifications in the thoracic aorta. IMPRESSION: 1. Support apparatus, as above. 2. Low lung volumes without radiographic evidence of acute cardiopulmonary disease. 3. Aortic atherosclerosis. Electronically Signed   By: Vinnie Langton M.D.   On: 02/14/2021 05:14    Labs: BMET Recent Labs  Lab 02/09/21 0415 02/10/21 0407 02/11/21 0432 02/12/21 0445 02/12/21 0549 02/13/21 0158 02/14/21 0304 02/15/21 0353  NA 139 143 138 140 144 140 137 139  K 3.5 3.5 4.5 4.6 4.5 3.9 4.0 3.9  CL 103 108 101 102  --  103 99 102  CO2 22 23 24  21*  --  22 20* 23  GLUCOSE 165* 158* 192* 232*  --  124* 222* 160*  BUN 46* 58* 31* 45*  --  45* 61* 78*  CREATININE 5.47* 6.26* 4.32* 5.88*  --  5.23* 5.73* 6.08*  CALCIUM 9.1 9.1 8.7* 8.9  --  8.7* 8.7* 8.9  PHOS 3.8 3.8 2.7 5.0*  --  4.1 4.5 5.2*    CBC Recent Labs  Lab 02/11/21 1217 02/12/21 0445 02/12/21 0549 02/13/21 0158 02/14/21 0304 02/15/21 0353  WBC 21.7* 27.0*  --  21.4* 17.8* 16.7*  NEUTROABS 16.1*  --   --   --   --   --   HGB 8.8* 8.8* 8.8* 8.4* 7.1* 8.6*  HCT 27.3* 26.7* 26.0* 25.7* 22.1* 25.2*  MCV 86.4 87.3  --  86.2 86.7 85.4  PLT 286 253  --  256 272 285     Medications:     chlorhexidine  15 mL Mouth Rinse BID   Chlorhexidine Gluconate Cloth  6 each Topical Q0600   clonazePAM  1 mg Per Tube Q8H   docusate  100 mg Per Tube BID   famotidine  20 mg Per Tube BID    feeding supplement (PROSource TF)  45 mL Per Tube BID   fentaNYL (SUBLIMAZE) injection  100 mcg Intravenous Once   guaiFENesin  10 mL Oral Q6H   hydrocerin   Topical BID   insulin aspart  0-15 Units Subcutaneous Q4H   insulin detemir  10 Units Subcutaneous Daily   levETIRAcetam  500 mg Per Tube BID   mouth rinse  15 mL Mouth Rinse q12n4p   mupirocin ointment  1 application Nasal BID   nystatin  5 mL Mouth/Throat QID   pantoprazole sodium  40 mg Per Tube BID   polyethylene glycol  17 g Per Tube Daily   QUEtiapine  25 mg Oral BID   sodium chloride HYPERTONIC  4  mL Nebulization BID     Justin Mend  02/15/2021, 7:52 AM

## 2021-02-15 NOTE — Plan of Care (Signed)

## 2021-02-15 NOTE — Progress Notes (Signed)
PROGRESS NOTE    Thomas [name]  WIO:[id number] DOB: 1955-08-18 DOA: 11/30/2020 PCP: [name], NP    Chief Complaint  Patient presents with   Abdominal Pain    Brief Narrative:   Thomas [name] is a 65 y.o. with a pertinent PMH of CVA, colon cancer s/p colectomy/colostomy, T2DM, ESRD (on HD), Epilepsy, HTN, HLD, OSA, DVT (lovenox), who presented in August  after recent surgery for colon cancer s/p resection with signs and symptoms of sepsis and worsening respiratory distress  admitted for aspiration pneumonia. Hospital course complicated by ischemic colitis and prolonged respiratory failure requiring tracheostomy, patient with very complicated hospital stay, please see below.       7/27 Transferred to [facility], stopped abx as sepsis resolved/ruled out 7/30 AM Worsened AME, more hypoxic with worsening renal funciton, new aspiration on abx restarted 7/30 PM Pt with increased o2 requirement on 100% FIO2 on BIPAP, d/w family who wanted to proceed with intubation, pt intubated 7/31 Tracheal aspirate grew moderate candida tropicalis  8/1 Some issues with tachycardia and hypotension overnight. Reproted high ostomy output.  GI PCR negative 8/2 down to 9 mcg of Levophed, however repeat CT abdomen/pelvis showed inflammatory wall thickening and fat stranding of the descending and sigmoid colon, new since prior, consistent with nonspecific infectious, inflammatory, or ischemic colitis.  Also new dense consolidative airspace opacity in the right lower and right middle lobes 8/3 further weaning NE. Weaning sedation. Full code, full scope per family meeting 8/2. Eraxis stopped (8/1-8/3) 8/4 off pressors, plt down to 103.  8/5 remains off pressors. Moving around more, not really following commands though  8/9 failed extubation 8/8, tolerated nasal cannula for about an hour, desaturations and inability to clear secretions-reintubated 8/8 8/10 some agitation overnight 8/11 tracheostomy placement On trach collar  since 8/16 8/22 pulled trach out, replaced under bronch guidance. Has proximal area of granulation tissue that is above the bottom of the trach tube.  8/25 Removed Trach, unable to reinsert, despite multiple attempts re-intubated 8/29 Anemia, 1 unit PRBC. ENT re-placed Trach in [employer]. 9/8 tracheal aspirate growing pan-sensitive P. Aeruginosa. Started on Ceftazidine  9/19 Continues to have thick secretions.  9/30 EGD significant for duodenal ulcer, nonbleeding. 10/5 had respiratory distress and increase in O2 requirements, frequent suctioning. Ct chest showed large mucus plug in the Lft upper/lower lobes, transferred to the ICU for mechanical ventilation. 10/6 wean sedation and SBT via trach collar 10/7, patient pulled his trach was inserted by RT with no complications.    Assessment & Plan:   Principal Problem:   Diabetes mellitus with hyperosmolarity without hyperglycemic hyperosmolar nonketotic coma (HCC) Active Problems:   Lactic acidosis   Acute renal failure superimposed on stage 2 chronic kidney disease (HCC)   Metabolic acidosis   Tachycardia   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 (HCC)   Adverse effect of chemotherapy   Pressure injury of skin   Atrial fibrillation with RVR (HCC)   Sepsis (HCC)   Malnutrition of moderate degree   Acute metabolic encephalopathy   Acute respiratory failure with hypoxemia (HCC)   Acute renal failure with acute cortical necrosis (HCC)   Shock ([place])   Status post tracheostomy ([place])   Endotracheal tube present   Hemodialysis status ([place])   Palliative care by specialist   SOB (shortness of breath)   Non-intractable vomiting   Abdominal pain, epigastric   Cerebral embolism with cerebral infarction   Acute esophagitis   Duodenal ulcer disease   Acute  hypoxic respiratory failure status post tracheostomy -Pulled his tracheostomy multiple times, his tracheostomy is complicated by intratracheal granulation tissue, with  difficult to place trach, ENT changed trach on 8/29 to cuffless trach. -Patient with aspiration with mucous plug, s/p bronchoscopy with resolution of his mucous plug, antibiotics per PCCM.treated with vancomycin and Zosyn, BAL culture growing Pseudomonas, she is currently on vancomycin, to finish total 5 days of Zosyn.   -Procalcitonin trending down. -Wean oxygen as tolerated, continue with pulmonary hygiene. -Patient pulled his trach again overnight, was reinserted by RT with no significant complications. -Patient remains with significant fluctuating mental status, patient is on Klonopin, started on Seroquel 10/8.    ESRD -Agement per renal, he refused hemodialysis yesterday, but today report he is agreeable for dialysis tomorrow. -Patient with urinary retention, 600 cc via in and out yesterday, and 500 cc via in and out today as well, bladder scan reading does not correlate with actual In-and Out readings, so we will have in and out daily for now.  Dysphagia/reflux esophagitis/nonbleeding duodenal ulcers -EGD 9/30 with the above findings difficult for esophagitis and nonbleeding duodenal ulcer.,  No H. pylori identified on biopsy. -Patient needs to continue PPI for total of 4-weeks (initiated 9/29)-have transitioned Protonix and Pepcid to oral route -For now continue scheduled Zofran -Follow-up EGD recommended after discharge -Patient with dysphagia, failed MBBS, PEG inserted 10/4.  MRI with bilateral punctate lesion (Frontal watershed) -SLP evaluated and noted patient is unable to elevate tongue properly bilaterally-continues to have difficulty with swallowing and phonation -Imaging consistent with acute infarcts -Stroke team following-etiology of infarcts yet to be determined-differential includes thrombotic etiology from either A. fib or from DVT if has PFO  Acute extensive right leg DVT:  -On anticoagulation, will hold for now given drop in hemoglobin requiring transfusion,   New  onset atrial fibrillation with RVR:  -On amiodarone and anticoagulation.  -Currently maintaining sinus rhythm w/o meds  CVA (incidental -Neuro Signed off  Anemia -This is most likely anemia of chronic kidney disease. -Requiring as needed transfusions, received 1 unit yesterday with good response, hemoglobin stable today at 8.4, no evidence of GI bleed.  Resumed on Eliquis.   DVT prophylaxis: Eliquis Code Status: Full Family Communication: None at bedside Disposition:   Status is: Inpatient  Remains inpatient appropriate because:Unsafe d/c plan  Dispo: The patient is from: Home              Anticipated d/c is to: SNF              Patient currently is not medically stable to d/c.   Difficult to place patient Yes    Consultants: [facility] Nephrology Hematology/oncology Palliative care medicine team   Procedures: 2u PRBC 7/31, 1u PRBC 8/17, 1u PRBC 8/27 Tracheostomy revision 01/05/2021 Dr. Constance Holster [facility] 8/22        Subjective:  Patient has refused his hemodialysis yesterday, but today he is more agreeable for tomorrow session, patient had urinary retention, he had 600 cc output via in and out, and he had another 500 urine output via in and out today as well.  .    Objective: Vitals:   02/15/21 1200 02/15/21 1235 02/15/21 1300 02/15/21 1525  BP:  110/69 110/62   Pulse: 96 (!) 102 98 94  Resp: _0 Temp:      TempSrc:      SpO2: 96% 100% 99% 100%  Weight:      Height:  Intake/Output Summary (Last 24 hours) at 02/15/2021 1532 Last data filed at 02/15/2021 1200 Gross per 24 hour  Intake 1614.93 ml  Output 1000 ml  Net 614.93 ml   Filed Weights   02/13/21 0600 02/14/21 0500 02/15/21 0347  Weight: 98.4 kg 99.3 kg 99.7 kg    Examination:  Awake Alert, he is communicative today, answering questions, but remains with poor cognition and insight  Trach present with some mild secretions  Symmetrical Chest wall movement, Good air movement  bilaterally, CTAB RRR,No Gallops,Rubs or new Murmurs, No Parasternal Heave +ve B.Sounds, Abd Soft, colostomy bag present with liquid color light brown stool, rectal tube present. No Cyanosis, Clubbing or edema, No new Rash or bruise     Data Reviewed: I have personally reviewed following labs and imaging studies  CBC: Recent Labs  Lab 02/11/21 1217 02/12/21 0445 02/12/21 0549 02/13/21 0158 02/14/21 0304 02/15/21 0353  WBC 21.7* 27.0*  --  21.4* 17.8* 16.7*  NEUTROABS 16.1*  --   --   --   --   --   HGB 8.8* 8.8* 8.8* 8.4* 7.1* 8.6*  HCT 27.3* 26.7* 26.0* 25.7* 22.1* 25.2*  MCV 86.4 87.3  --  86.2 86.7 85.4  PLT 286 253  --  256 272 654    Basic Metabolic Panel: Recent Labs  Lab 02/09/21 0415 02/10/21 0407 02/11/21 0432 02/12/21 0445 02/12/21 0549 02/13/21 0158 02/14/21 0304 02/15/21 0353  NA 139 143 138 140 144 140 137 139  K 3.5 3.5 4.5 4.6 4.5 3.9 4.0 3.9  CL 103 108 101 102  --  103 99 102  CO2 _0 21*  --  22 20* 23  GLUCOSE 165* 158* 192* 232*  --  124* 222* 160*  BUN 46* 58* 31* 45*  --  45* 61* 78*  CREATININE 5.47* 6.26* 4.32* 5.88*  --  5.23* 5.73* 6.08*  CALCIUM 9.1 9.1 8.7* 8.9  --  8.7* 8.7* 8.9  MG 1.9 1.9  --   --   --   --   --   --   PHOS 3.8 3.8 2.7 5.0*  --  4.1 4.5 5.2*    GFR: Estimated Creatinine Clearance: 14 mL/min (A) (by C-G formula based on SCr of 6.08 mg/dL (H)).  Liver Function Tests: Recent Labs  Lab 02/11/21 0432 02/12/21 0445 02/13/21 0158 02/14/21 0304 02/15/21 0353  ALBUMIN 2.8* 2.4* 2.4* 2.1* 2.1*    CBG: Recent Labs  Lab 02/14/21 2319 02/15/21 0320 02/15/21 0716 02/15/21 1106 02/15/21 1525  GLUCAP 188* 153* 171* 186* 118*     Recent Results (from the past 240 hour(s))  Culture, Respiratory w Gram Stain     Status: None   Collection Time: 02/11/21 12:24 PM   Specimen: Tracheal Aspirate; Respiratory  Result Value Ref Range Status   Specimen Description TRACHEAL ASPIRATE  Final   Special Requests NONE   Final   Gram Stain   Final    ABUNDANT WBC PRESENT,BOTH PMN AND MONONUCLEAR FEW GRAM NEGATIVE RODS RARE GRAM POSITIVE COCCI Performed at [facility], [place] [address], [place], [place] [address]    Culture MODERATE PSEUDOMONAS AERUGINOSA  Final   Report Status 02/14/2021 FINAL  Final   Organism ID, Bacteria PSEUDOMONAS AERUGINOSA  Final      Susceptibility   Pseudomonas aeruginosa - MIC*    CEFTAZIDIME <=1 SENSITIVE Sensitive     CIPROFLOXACIN <=0.25 SENSITIVE Sensitive     GENTAMICIN <=1 SENSITIVE Sensitive  PIP/TAZO <=4 SENSITIVE Sensitive     * MODERATE PSEUDOMONAS AERUGINOSA         Radiology Studies: DG CHEST PORT 1 VIEW  Result Date: 02/14/2021 CLINICAL DATA:  65 year old male with history of tracheostomy. EXAM: PORTABLE CHEST 1 VIEW COMPARISON:  Chest x-ray 02/12/2021. FINDINGS: A tracheostomy tube is in place with tip 3.6 cm above the carina. Right internal jugular Port-A-Cath with tip terminating in the superior aspect of the right atrium. Left internal jugular PermCath with tip terminating at the superior cavoatrial junction. Lung volumes are low. No consolidative airspace disease. No pleural effusions. No pneumothorax. No pulmonary nodule or mass noted. Pulmonary vasculature and the cardiomediastinal silhouette are within normal limits. Atherosclerotic calcifications in the thoracic aorta. IMPRESSION: 1. Support apparatus, as above. 2. Low lung volumes without radiographic evidence of acute cardiopulmonary disease. 3. Aortic atherosclerosis. Electronically Signed   By: Vinnie Langton M.D.   On: 02/14/2021 05:14        Scheduled Meds:  apixaban  5 mg Oral BID   chlorhexidine  15 mL Mouth Rinse BID   Chlorhexidine Gluconate Cloth  6 each Topical Q0600   clonazePAM  1 mg Per Tube Q8H   docusate  100 mg Per Tube BID   famotidine  20 mg Per Tube BID   feeding supplement (PROSource TF)  45 mL Per Tube BID   fentaNYL (SUBLIMAZE) injection  100 mcg Intravenous  Once   guaiFENesin  10 mL Oral Q6H   hydrocerin   Topical BID   insulin aspart  0-15 Units Subcutaneous Q4H   insulin detemir  10 Units Subcutaneous Daily   levETIRAcetam  500 mg Per Tube BID   mouth rinse  15 mL Mouth Rinse q12n4p   mupirocin ointment  1 application Nasal BID   nystatin  5 mL Mouth/Throat QID   pantoprazole sodium  40 mg Per Tube BID   polyethylene glycol  17 g Per Tube Daily   QUEtiapine  25 mg Oral BID   sodium chloride HYPERTONIC  4 mL Nebulization BID   Continuous Infusions:  sodium chloride 500 mL (02/14/21 2209)   anticoagulant sodium citrate     feeding supplement (NEPRO CARB STEADY) 1,000 mL (02/15/21 0722)   piperacillin-tazobactam (ZOSYN)  IV Stopped (02/15/21 1021)   promethazine (PHENERGAN) injection (IM or IVPB) 12.5 mg (02/05/21 0416)     LOS: 67 days       Phillips Climes, MD [facility]   To contact the attending provider between 7A-7P or the covering provider during after hours 7P-7A, please log into the web site [email or web address] and access using universal [place] password for that web site. If you do not have the password, please call the hospital operator.  02/15/2021, 3:32 PM

## 2021-02-16 DIAGNOSIS — E1100 Type 2 diabetes mellitus with hyperosmolarity without nonketotic hyperglycemic-hyperosmolar coma (NKHHC): Secondary | ICD-10-CM | POA: Diagnosis not present

## 2021-02-16 LAB — RENAL FUNCTION PANEL
Albumin: 2.1 g/dL — ABNORMAL LOW (ref 3.5–5.0)
Anion gap: 14 (ref 5–15)
BUN: 89 mg/dL — ABNORMAL HIGH (ref 8–23)
CO2: 25 mmol/L (ref 22–32)
Calcium: 9.2 mg/dL (ref 8.9–10.3)
Chloride: 103 mmol/L (ref 98–111)
Creatinine, Ser: 5.8 mg/dL — ABNORMAL HIGH (ref 0.61–1.24)
GFR, Estimated: 10 mL/min — ABNORMAL LOW (ref >60)
Glucose, Bld: 130 mg/dL — ABNORMAL HIGH (ref 70–99)
Phosphorus: 5 mg/dL — ABNORMAL HIGH (ref 2.5–4.6)
Potassium: 4 mmol/L (ref 3.5–5.1)
Sodium: 142 mmol/L (ref 135–145)

## 2021-02-16 LAB — CBC
HCT: 26 % — ABNORMAL LOW (ref 39.0–52.0)
Hemoglobin: 8.7 g/dL — ABNORMAL LOW (ref 13.0–17.0)
MCH: 28.4 pg (ref 26.0–34.0)
MCHC: 33.5 g/dL (ref 30.0–36.0)
MCV: 85 fL (ref 80.0–100.0)
Platelets: 293 10*3/uL (ref 150–400)
RBC: 3.06 MIL/uL — ABNORMAL LOW (ref 4.22–5.81)
RDW: 14.7 % (ref 11.5–15.5)
WBC: 15.2 10*3/uL — ABNORMAL HIGH (ref 4.0–10.5)
nRBC: 0 % (ref 0.0–0.2)

## 2021-02-16 LAB — GLUCOSE, CAPILLARY
Glucose-Capillary: 116 mg/dL — ABNORMAL HIGH (ref 70–99)
Glucose-Capillary: 203 mg/dL — ABNORMAL HIGH (ref 70–99)
Glucose-Capillary: 158 mg/dL — ABNORMAL HIGH (ref 70–99)
Glucose-Capillary: 125 mg/dL — ABNORMAL HIGH (ref 70–99)
Glucose-Capillary: 148 mg/dL — ABNORMAL HIGH (ref 70–99)
Glucose-Capillary: 194 mg/dL — ABNORMAL HIGH (ref 70–99)

## 2021-02-16 MED ORDER — FENTANYL CITRATE PF 50 MCG/ML IJ SOSY
100.0000 ug | PREFILLED_SYRINGE | Freq: Once | INTRAMUSCULAR | Status: AC
Start: 2021-02-16 — End: 2021-02-16
  Administered 2021-02-16: 100 ug via INTRAVENOUS
  Filled 2021-02-16: qty 2

## 2021-02-16 NOTE — Progress Notes (Addendum)
[facility] PROGRESS NOTE  Thomas [name]:[id number] DOB: 1956/02/22 DOA: 11/30/2020 PCP: [name], NP  Status: Remains inpatient appropriate because:Persistent severe electrolyte disturbances, Unsafe d/c plan, and Inpatient level of care appropriate due to severity of illness  Dispo: The patient is from: Home              Anticipated d/c is to: SNF versus LTAC vs CIR              Patient currently is not medically stable to d/c.   Difficult to place patient Yes               Barriers to discharge: Currently has tracheostomy in place and is requiring dialysis but remains too weak to tolerate dialysis in chair which is a requirement for outpatient HD   Level of care: Progressive  Code Status: Full Family Communication: Attending physician spoke with wife at bedside 9/26 DVT prophylaxis: FD Lovenox COVID vaccination status: Moderna 07/08/2019 and 08/11/2019 with first booster dose given 08/05/2020   HPI:  65 y.o. male with a history of T2DM with neuropathy, HTN, HLD., colon cancer s/p resection and colostomy May 2022 s/p 3 cycles chemotherapy (last was July 2022). Followed by Dr. Benay Spice. Started CAPOX June 2022, reduced cepecitabine dose starting 11/19/2020 due to hand/foot syndrome, reduced oxaliplatin due to renal impairment and poor performance status. Presented to the ED 7/24 with abdominal pain found to be febrile, tachycardic, tachypneic. Lactic acid was 6.6 and persisted at 6.8 after sepsis IV fluid bolus and cefepime. He had ARF, demand ischemia, bandemia, was hyperglycemic with negative ketones, with lactic acidosis. He developed AFib with RVR in the ED, was started on diltiazem, and was admitted to the ICU for septic shock with unclear source. Sepsis was subsequently felt to be ruled out at time of admission, presentation felt more likely to be related to cepacitabine toxicity with oral mucositis, hand/foot syndrome, and diarrhea/high output colostomy.   He was transferred  to hospitalist service 7/27 but developed metabolic encephalopathy with hyoxia due to aspiration pneumonia on 7/30 requiring intubation and pressor support. Failed extubation with inability to clear secretions, requiring reintubation 8/8, and tracheostomy subsequently placed 8/11. This was pulled out 8/22, replaced under bronch guidance, again removed 8/25 requiring reintubation after failed reinsertion. ENT revised tracheostomy in the OR 8/29. He has required IHD for ARF, and also had complications of RLE DVT now on eliquis, colitis thought to be ischemic 8/2 not requiring specific intervention, Pseudomonas pneumonia having completed treatment, agitated encephalopathy which has improved, anemia requiring 4u PRBCs total, and thrombocytopenia which has resolved. On 9/4 he was transferred to hospitalist service having tolerated trach collar.  Patient was also evaluated by Dr. Benay Spice during this admission clarified that patient was receiving curative chemotherapy prior to admission  Subjective: Patient remains extremely anxious.  When I came into room he was shivering and stating he was cold.  Multiple blankets placed over patient with improvement in sensation of feeling cold.  Patient has very long great toe toenail and when toe touched patient reported significant pain but I was able to later lay the blanket over the toe --no other abnormalities noted on foot or toe.  Objective: Vitals:   02/16/21 0700 02/16/21 0742  BP: 116/78   Pulse: 87 91  Resp: 14 17  Temp:  98.4 F (36.9 C)  SpO2: 100% 100%    Intake/Output Summary (Last 24 hours) at 02/16/2021 0749 Last data filed at 02/16/2021 0600 Gross per 24 hour  Intake 1538.34 ml  Output 2175 ml  Net -636.66 ml   Filed Weights   02/14/21 0500 02/15/21 0347 02/16/21 0500  Weight: 99.3 kg 99.7 kg 98.9 kg    Exam: Constitutional: NAD, anxious without associated respiratory symptoms. Respiratory: #6 XLT uncuffed trach, RA to TC, anterior lung  sounds remain clear to auscultation without increased work of breathing.  No excessive tracheal secretions.  Pulse oximetry 100%. Cardiovascular: S1-S2, normotensive, no peripheral edema.  Underlying sinus rhythm. Abdomen: Abdomen flat, PEG site unremarkable, active bowel sounds.  Colostomy stoma pink with liquid stool Genitourinary: Continues to have issues with recurrent urinary retention and requires in and out catheterization at least once per shift Neurologic: CN 2-12 grossly intact. Sensation intact, Strength 3/5 x all 4 extremities.  Psychiatric: Oriented x3 but extremely anxious.   Assessment/Plan: Acute problems:  Acute hypoxic respiratory failure s/p tracheostomy due to aspiration pneumonia /Recurrent pansensitive Pseudomonas t -Status post bronchoscopy and required short-term assistance with mechanical ventilation last week. -Tolerating PMV.  Trach team wants to make sure patient more mobile with stronger vocalization before attempt capping trials. -SLP following for PMV training.  Grade LA C reflux esophagitis/nonbleeding duodenal ulcers -H. pylori pending -Continue PPI for total of 4-weeks (initiated 9/29) -Continue prn Zofran -Follow-up EGD recommended after discharge  Inability to elevate tongue  -Neurology has documented that it is very rare to have bilateral hypoglossal nerve injury in context of intubated/reintubation--neuro suspects related to mucositis, oral thrush, hand/foot syndrome -10/3 MBS demonstrated silent aspiration and significant difficulty with tongue and upper motility noting even the hyoid bone/surrounding muscular structures are not moving with attempts to swallow -It is hopeful that with treatment of severe mucositis as well as severe esophagitis that swallowing will improve  Urinary retention -Has completed Diflucan as of 9/28 -Foley catheter discontinued on 10/3 but had recurrent urinary retention that did not correlate w/ bladder scan readings- 10/10  will replace foley-rec OP Urology eval  Dysphagia/protein calorie malnutrition -PEG placed 10/4 -Continue tube feeding with nutritional supplements/protein supplements  MRI with bilateral punctate lesion (Frontal watershed) -Imaging consistent with acute infarcts -Stroke team differential includes thrombotic etiology from either A. fib or from DVT if has PFO -Continue Lovenox -statin currently on hold due to recent GI issues, Hgba1c 7/25 was 8.7; HDL cholesterol is low, LDL cholesterol at goal -Recommendation is follow-up MRI in 6 to 8 weeks with and without contrast since not clear as to whether this could also be metastatic in etiology  Acute renal failure on stage IIIb CKD: Presumed ischemic ATN -started CRRT 7/31 > iHD 8/12 s/p left IJ [facility] 8/15.   - Continue HD TTS -Still unclear if will recover renal function although continues to make urine-recent urinary output obscured by the fact he was having ongoing urinary retention that was not picked up on bladder scans   High output ostomy, diarrhea:  -Infectious work-up negative -Recent PSBO likely caused by Imodium and discontinued  Funguria/Thrush -Diflucan completed 9/27 -10/10 discontinue oral nystatin   Anemia of critical illness, acute blood loss as well as anemia of chronic disease and iron deficiency:  - s/p 4u PRBCs this hospitalization  - IV iron TTS x8 doses per nephrology. -Recent hemoglobin stable at 10.6 with normal platelets   Stage IIIb colon CA s/p partial transverse colectomy with end colostomy May 2022:  - Palliative care has been involved during this hospitalization- goals remain clear: full aggressive measures desired.   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   Uncontrolled T2DM on OHAs prior to admission with associated neuropathy:  -HbA1c 8.7% at admission. - Continue levemir and SSI  -Continue to hold gabapentin and gradually reintroduce medications   HTN:  -Norvasc held secondary to recent nausea and vomiting -BP has been relatively well controlled    HLD:  -Atorvastatin on hold due to nausea and vomiting   Thrombocytopenia:  -Transient likely due to critical illness. HIT Ab negative on 8/5.    ?Sepsis - initially thought to be present on admission- has been ruled out.    Pressure Injury 02/05/21 Coccyx Lower Stage 2 -  Partial thickness loss of dermis presenting as a shallow open injury with a red, pink wound bed without slough. healing quarter sized spot on sacrum (Active)  Date First Assessed/Time First Assessed: 02/05/21 0800   Location: Coccyx  Location Orientation: Lower  Staging: Stage 2 -  Partial thickness loss of dermis presenting as a shallow open injury with a red, pink wound bed without slough.  Wound Descript...    Assessments 02/05/2021  5:10 PM 02/15/2021  8:00 PM  Dressing Type Foam - Lift dressing to assess site every shift Foam - Lift dressing to assess site every shift  Dressing Changed Clean;Dry;Intact  Dressing Change Frequency -- Daily  State of Healing Early/partial granulation --  Site / Wound Assessment Pink Clean;Dry  % Wound base Red or Granulating 100% --  Wound Length (cm) 2 cm --  Wound Width (cm) 2 cm --  Wound Surface Area (cm^2) 4 cm^2 --  Margins Attached edges (approximated) Attached edges (approximated)   Drainage Amount None None  Treatment Cleansed --     No Linked orders to display     Data Reviewed: Basic Metabolic Panel: Recent Labs  Lab 02/10/21 0407 02/11/21 0432 02/12/21 0445 02/12/21 0549 02/13/21 0158 02/14/21 0304 02/15/21 0353 02/16/21 0206  NA 143   < > 140 144 140 137 139 142  K 3.5   < > 4.6 4.5 3.9 4.0 3.9 4.0  CL 108   < > 102  --  103 99 102 103  CO2 23   < > 21*  --  22 20* 23 25  GLUCOSE 158*   < > 232*  --  124* 222* 160* 130*  BUN 58*   < > 45*  --  45* 61* 78* 89*  CREATININE 6.26*   < > 5.88*  --  5.23* 5.73* 6.08* 5.80*  CALCIUM 9.1   < > 8.9  --  8.7* 8.7* 8.9 9.2  MG 1.9  --   --   --   --   --   --   --   PHOS 3.8   < > 5.0*  --  4.1 4.5 5.2* 5.0*   < > = values in this interval not displayed.   Liver Function Tests: Recent Labs  Lab 02/12/21 0445 02/13/21 0158 02/14/21 0304 02/15/21  8144 02/16/21 0206  ALBUMIN 2.4* 2.4* 2.1* 2.1* 2.1*    CBC: Recent Labs  Lab 02/11/21 1217 02/12/21 0445 02/12/21 0549 02/13/21 0158 02/14/21 0304 02/15/21 0353 02/16/21 0206  WBC 21.7* 27.0*  --  21.4* 17.8* 16.7* 15.2*  NEUTROABS 16.1*  --   --   --   --   --   --   HGB 8.8* 8.8* 8.8* 8.4* 7.1* 8.6* 8.7*  HCT 27.3* 26.7* 26.0* 25.7* 22.1* 25.2* 26.0*  MCV 86.4 87.3  --  86.2 86.7 85.4 85.0  PLT 286 253  --  256 272 285 293    CBG: Recent Labs  Lab 02/15/21 1525 02/15/21 1926 02/15/21 2332 02/16/21 0315 02/16/21 0741  GLUCAP 118* 199* 175* 116* 203*    Scheduled Meds:  apixaban  5 mg Oral BID   chlorhexidine  15 mL Mouth Rinse BID   Chlorhexidine Gluconate Cloth  6 each Topical Q0600   clonazePAM  1 mg Per Tube Q8H   docusate  100 mg Per Tube BID   famotidine  20 mg Per Tube BID   feeding supplement (PROSource TF)  45 mL Per Tube BID   fentaNYL (SUBLIMAZE) injection  100 mcg Intravenous Once   guaiFENesin  10 mL Oral Q6H   hydrocerin   Topical BID   insulin aspart  0-15 Units Subcutaneous Q4H   insulin detemir  10 Units  Subcutaneous Daily   levETIRAcetam  500 mg Per Tube BID   mouth rinse  15 mL Mouth Rinse q12n4p   mupirocin ointment  1 application Nasal BID   nystatin  5 mL Mouth/Throat QID   pantoprazole sodium  40 mg Per Tube BID   polyethylene glycol  17 g Per Tube Daily   QUEtiapine  25 mg Oral BID   sodium chloride HYPERTONIC  4 mL Nebulization BID   Continuous Infusions:  sodium chloride 500 mL (02/14/21 2209)   anticoagulant sodium citrate Stopped (02/15/21 2213)   feeding supplement (NEPRO CARB STEADY) 1,000 mL (02/16/21 0143)   promethazine (PHENERGAN) injection (IM or IVPB) 12.5 mg (02/05/21 0416)    Principal Problem:   Diabetes mellitus with hyperosmolarity without hyperglycemic hyperosmolar nonketotic coma (HCC) Active Problems:   Lactic acidosis   Acute renal failure superimposed on stage 2 chronic kidney disease (HCC)   Metabolic acidosis   Tachycardia   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 (HCC)   Adverse effect of chemotherapy   Pressure injury of skin   Atrial fibrillation with RVR (HCC)   Sepsis (HCC)   Malnutrition of moderate degree   Acute metabolic encephalopathy   Acute respiratory failure with hypoxemia (HCC)   Acute renal failure with acute cortical necrosis (HCC)   Shock (HCC)   Status post tracheostomy ([place])   Endotracheal tube present   Hemodialysis status ([place])   Palliative care by specialist   SOB (shortness of breath)   Non-intractable vomiting   Abdominal pain, epigastric   Cerebral embolism with cerebral infarction   Acute esophagitis   Duodenal ulcer disease   Consultants: PCCM General surgery Nephrology Hematology/oncology Palliative care medicine team  Procedures: 2u PRBC 7/31, 1u PRBC 8/17, 1u PRBC 8/27 Tracheostomy revision 01/05/2021 Dr. Constance Holster [facility] 8/22    Antibiotics: Vancomycin 7/24 Cefepime 7/24 - 7/27 Unasyn 7/30 >> cefepime/flagyl 7/31 - 8/6     Time spent: 40 minutes    [name]  ANP  [facility] 7 am - 330 pm/M-F for direct patient care and secure chat  Please refer to Amion for contact info 77  days

## 2021-02-16 NOTE — Progress Notes (Signed)
PT Cancellation Note  Patient Details Name: Thomas [name] MRN: [id number] DOB: 1955-11-13   Cancelled Treatment:    Reason Eval/Treat Not Completed: Patient declined, no reason specified (pt lethargic with inability to participate. pt with limited ability to maintain eye opening and refused any mobility or HEP)   Allyssia Skluzacek B Thijs Brunton 02/16/2021, 11:50 AM Bayard Males, PT Acute Rehabilitation Services Pager: [phone] Office: [phone]

## 2021-02-16 NOTE — TOC Transition Note (Addendum)
Transition of Care [facility]) - CM/SW Discharge Note   Patient Details  Name: Thomas [name] MRN: [id number] Date of Birth: November 11, 1955  [facility] CM/SW Contact:  Curlene Labrum, RN Phone Number: 02/16/2021, 10:08 AM   Clinical Narrative:    CM and MSW with DTP Team continue to follow the patient at this time for transitions of care needs.  The patient will need SNF / CIR placement prior to return home - but at this time the patient is unable to transition out of the hospital since the patient requires trach management and HD.  CIR at [facility] is continuing to follow the patient.  Patient is refusing HD at this time.  Palliative care may need to revisit with the patient and family considering his extensive medical needs.  [name], NP is aware and I sent a message palliative care on call today for follow up.  CM and MSW will continue to follow the patient for TOC needs.   Final next level of care: IP Rehab Facility Barriers to Discharge: Continued Medical Work up   Patient Goals and CMS Choice Patient states their goals for this hospitalization and ongoing recovery are:: Patient would like to return home after probable rehab placement for physical reconditioning. CMS Medicare.gov Compare Post Acute Care list provided to:: Patient Choice offered to / list presented to : Patient  Discharge Placement                       Discharge Plan and Services   Discharge Planning Services: CM Consult Post Acute Care Choice: IP Rehab (IP Rehab versus SNF for rehab - has tracheostomy and HD care needs.)                               Social Determinants of Health (SDOH) Interventions     Readmission Risk Interventions Readmission Risk Prevention Plan 02/16/2021  Transportation Screening Complete  Medication Review Press photographer) Complete  PCP or Specialist appointment within 3-5 days of discharge Complete  HRI or Home Care Consult Complete  SW  Recovery Care/Counseling Consult Complete  Palliative Care Screening Complete  Skilled Nursing Facility Complete  Some recent data might be hidden

## 2021-02-16 NOTE — Progress Notes (Addendum)
Occupational Therapy Treatment Patient Details Name: Thomas [name] MRN: [id number] DOB: [birth year] Today's Date: 02/16/2021   History of present illness 65 y.o. male admitted 7/24 with high ostomy output. Pt with severe sepsis and multisystem organ failure. Hospital admission complicated by volume overload, aspiration pneumonia, acute respiratory failure, AKI and ischemic colitis. Intubated 7/30. S/p trach placement 8/11. [facility] 8/12. HD initiated 8/12. To IR 8/15 for tunneled HD cath. Pulled out trach x2 8/22 replaced by PCCM. Pulled out trach again 8/25 but unable to be reinserted with pt intubated. 8/29 to OR for stoma clean up and trach replacement.Trach downsized 9/10. 10/5 respiratory distress with mucous plug requiring bronch, trach change and ventilator 10/5-10/6.  10/6 transitioned to trach collar, 10/7 pulled trach and inserted by RT with no complications. 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   OT comments  Patient supine in bed and agreeable to bed level oral care. Requires max hand over hand assist to begin oral care using R hand, limited tolerance and therapist completed task.  Pt agreeable to ROM to BUEs, voicing "i'll try"; limited ROM at B shoulder and elbow joints x 5 reps AAROM/PROM (unclear if pt is resisting movement or stiffness causing limited ROM).  He remains limited by cognition, pain and anxiety.  Recommend focus on BUE ROM next session for ADL performance. Updated goals today, will follow acutely.    Recommendations for follow up therapy are one component of a multi-disciplinary discharge planning process, led by the attending physician.  Recommendations may be updated based on patient status, additional functional criteria and insurance authorization.    Follow  Up Recommendations  SNF;Supervision/Assistance - 24 hour    Equipment Recommendations  Other (comment) (TBD)    Recommendations for Other Services Other (comment) (palliative care)    Precautions / Restrictions Precautions Precautions: Fall Precaution Comments: R subclavian tunneled HD cath, ostomy, peg, trach       Mobility Bed Mobility               General bed mobility comments: deferred    Transfers                 General transfer comment: deferred    Balance                                           ADL either performed or assessed with clinical judgement   ADL Overall ADL's : Needs assistance/impaired Eating/Feeding: NPO   Grooming: Oral care;Maximal assistance;Bed level Grooming Details (indicate cue type and reason): max assist bed level hand over hand assist to complete suction oral care, using R hand with limited tolerance to ROM of UE                               General ADL Comments: remained bed level     Vision   Additional Comments: cueing to maintain eyes open during ADL tasks   Perception     Praxis      Cognition Arousal/Alertness: Lethargic;Suspect due to medications Behavior During Therapy: Flat affect Overall Cognitive Status: Difficult to assess  General Comments: pt reports discomfort at trach site, able to follow 1 step commands with increased time but requires mulitmodal cueing. He remains limited by anxiety, and is lethargic from pain medications.        Exercises Exercises: Other exercises Other Exercises Other Exercises: supine BUE ROM exercises, pt voicing "I'll try".  AAROM to PROM to shoulders and elbow with limited range x 5 reps due to pt resisting movement vs stiffness? (difficult to determine)   Shoulder Instructions       General Comments      Pertinent Vitals/ Pain       Pain Assessment: Faces Faces Pain Scale: Hurts  little more Pain Location: trach site Pain Descriptors / Indicators: Grimacing;Guarding;Restless Pain Intervention(s): Limited activity within patient's tolerance;Monitored during session;Repositioned  Home Living                                          Prior Functioning/Environment              Frequency  Min 2X/week        Progress Toward Goals  OT Goals(current goals can now be found in the care plan section)  Progress towards OT goals: Not progressing toward goals - comment (pain, anxiety)  Acute Rehab OT Goals Patient Stated Goal: none stated today OT Goal Formulation: Patient unable to participate in goal setting ADL Goals Pt Will Perform Grooming: with min assist;sitting;bed level Pt Will Perform Upper Body Dressing: with min assist;sitting;bed level Pt/caregiver will Perform Home Exercise Program: Increased ROM;Increased strength;Both right and left upper extremity Additional ADL Goal #1: Pt will complete bed mobility with mod assist +2 as percursor to ADLs.  Plan Discharge plan remains appropriate;Frequency remains appropriate    Co-evaluation                 AM-PAC OT "6 Clicks" Daily Activity     Outcome Measure   Help from another person eating meals?: Total Help from another person taking care of personal grooming?: A Lot Help from another person toileting, which includes using toliet, bedpan, or urinal?: Total Help from another person bathing (including washing, rinsing, drying)?: Total Help from another person to put on and taking off regular upper body clothing?: Total Help from another person to put on and taking off regular lower body clothing?: Total 6 Click Score: 7    End of Session Equipment Utilized During Treatment: Oxygen (via trach collar)  OT Visit Diagnosis: Unsteadiness on feet (R26.81);Muscle weakness (generalized) (M62.81);Pain Pain - part of body:  (trach site)   Activity Tolerance Patient limited by  pain;Patient limited by lethargy   Patient Left in bed;with call bell/phone within reach;with bed alarm set   Nurse Communication Mobility status        Time: 1125-1141 OT Time Calculation (min): 16 min  Charges: OT General Charges $OT Visit: 1 Visit OT Treatments $Self Care/Home Management : 8-22 mins  Jolaine Artist, OT [place] Pager [phone] Office [phone]   Delight Stare 02/16/2021, 12:00 PM

## 2021-02-16 NOTE — Progress Notes (Signed)
Patient ID: Thomas [name], male   DOB: July 21, 1955, 65 y.o.   MRN: [id number]  [facility] Progress Note   Assessment/ Plan:   1.  Sepsis/aspiration pneumonia with ventilator dependent respiratory failure: Status post tracheostomy with acute decompensation last week secondary to mucous plugging.  Now improved and remains stable on trach collar. 2. Dialysis dependent acute kidney injury on chronic kidney disease stage IIIb: With lack of renal recovery or significant urine output, presumed that this is end-stage renal disease for which he is getting dialysis on a TTS schedule-he refused his last dialysis on 10/8 and informs me that he does not want to do dialysis today either.  We revisited the role of dialysis as a life-sustaining procedure in his case and he expressed understanding.  Agree with revisiting goals of care with the palliative care service if he does not intend to do additional hemodialysis. 3. Anemia: Without overt blood loss and holding ESA in the setting of malignancy.  PRBC transfusion as needed for hemoglobin <8. 4. CKD-MBD: Calcium and phosphorus level currently at acceptable range, continue to monitor on sevelamer. 5.  Stage IIIb colon cancer: Status post partial colectomy/colostomy and cycle 3 out of 4 of chemotherapy.  His last chemotherapy cycle was limited by physical status/deconditioning and toxicity from medications. 6. Hypertension: Blood pressure currently acceptable, continue to follow with ongoing hemodialysis.  Subjective:   Reports that he is tired and does not want to do hemodialysis today.   Objective:   BP 116/78   Pulse 91   Temp 98.4 F (36.9 C) (Axillary)   Resp 17   Ht 5\' 8"  (1.727 m)   Wt 98.9 kg   SpO2 100%   BMI 33.15 kg/m   Physical Exam: Gen: Awake, no apparent distress but appears chronically ill CVS: Pulse regular rhythm, normal rate, S1 and S2 normal Resp: Coarse/transmitted breath sounds bilaterally without rales.  Left IJ  TDC Abd: Soft, obese, left upper quadrant G-tube Ext: Trace lower extremity edema  Labs: BMET Recent Labs  Lab 02/10/21 0407 02/11/21 0432 02/12/21 0445 02/12/21 0549 02/13/21 0158 02/14/21 0304 02/15/21 0353 02/16/21 0206  NA 143 138 140 144 140 137 139 142  K 3.5 4.5 4.6 4.5 3.9 4.0 3.9 4.0  CL 108 101 102  --  103 99 102 103  CO2 23 24 21*  --  22 20* 23 25  GLUCOSE 158* 192* 232*  --  124* 222* 160* 130*  BUN 58* 31* 45*  --  45* 61* 78* 89*  CREATININE 6.26* 4.32* 5.88*  --  5.23* 5.73* 6.08* 5.80*  CALCIUM 9.1 8.7* 8.9  --  8.7* 8.7* 8.9 9.2  PHOS 3.8 2.7 5.0*  --  4.1 4.5 5.2* 5.0*   CBC Recent Labs  Lab 02/11/21 1217 02/12/21 0445 02/13/21 0158 02/14/21 0304 02/15/21 0353 02/16/21 0206  WBC 21.7*   < > 21.4* 17.8* 16.7* 15.2*  NEUTROABS 16.1*  --   --   --   --   --   HGB 8.8*   < > 8.4* 7.1* 8.6* 8.7*  HCT 27.3*   < > 25.7* 22.1* 25.2* 26.0*  MCV 86.4   < > 86.2 86.7 85.4 85.0  PLT 286   < > 256 272 285 293   < > = values in this interval not displayed.     Medications:     apixaban  5 mg Oral BID   chlorhexidine  15 mL Mouth Rinse BID   Chlorhexidine  Gluconate Cloth  6 each Topical Q0600   clonazePAM  1 mg Per Tube Q8H   docusate  100 mg Per Tube BID   famotidine  20 mg Per Tube BID   feeding supplement (PROSource TF)  45 mL Per Tube BID   fentaNYL (SUBLIMAZE) injection  100 mcg Intravenous Once   guaiFENesin  10 mL Oral Q6H   hydrocerin   Topical BID   insulin aspart  0-15 Units Subcutaneous Q4H   insulin detemir  10 Units Subcutaneous Daily   levETIRAcetam  500 mg Per Tube BID   mouth rinse  15 mL Mouth Rinse q12n4p   mupirocin ointment  1 application Nasal BID   nystatin  5 mL Mouth/Throat QID   pantoprazole sodium  40 mg Per Tube BID   polyethylene glycol  17 g Per Tube Daily   QUEtiapine  25 mg Oral BID   sodium chloride HYPERTONIC  4 mL Nebulization BID   Elmarie Shiley, MD 02/16/2021, 8:13 AM

## 2021-02-17 DIAGNOSIS — E1100 Type 2 diabetes mellitus with hyperosmolarity without nonketotic hyperglycemic-hyperosmolar coma (NKHHC): Secondary | ICD-10-CM | POA: Diagnosis not present

## 2021-02-17 LAB — GLUCOSE, CAPILLARY
Glucose-Capillary: 156 mg/dL — ABNORMAL HIGH (ref 70–99)
Glucose-Capillary: 144 mg/dL — ABNORMAL HIGH (ref 70–99)
Glucose-Capillary: 162 mg/dL — ABNORMAL HIGH (ref 70–99)
Glucose-Capillary: 192 mg/dL — ABNORMAL HIGH (ref 70–99)
Glucose-Capillary: 180 mg/dL — ABNORMAL HIGH (ref 70–99)
Glucose-Capillary: 112 mg/dL — ABNORMAL HIGH (ref 70–99)
Glucose-Capillary: 143 mg/dL — ABNORMAL HIGH (ref 70–99)

## 2021-02-17 LAB — RENAL FUNCTION PANEL
Albumin: 2.2 g/dL — ABNORMAL LOW (ref 3.5–5.0)
Anion gap: 14 (ref 5–15)
BUN: 105 mg/dL — ABNORMAL HIGH (ref 8–23)
CO2: 20 mmol/L — ABNORMAL LOW (ref 22–32)
Calcium: 9 mg/dL (ref 8.9–10.3)
Chloride: 105 mmol/L (ref 98–111)
Creatinine, Ser: 5.72 mg/dL — ABNORMAL HIGH (ref 0.61–1.24)
GFR, Estimated: 10 mL/min — ABNORMAL LOW (ref >60)
Glucose, Bld: 176 mg/dL — ABNORMAL HIGH (ref 70–99)
Phosphorus: 4 mg/dL (ref 2.5–4.6)
Potassium: 4.7 mmol/L (ref 3.5–5.1)
Sodium: 139 mmol/L (ref 135–145)

## 2021-02-17 MED ORDER — FENTANYL CITRATE PF 50 MCG/ML IJ SOSY
50.0000 ug | PREFILLED_SYRINGE | INTRAMUSCULAR | Status: DC | PRN
  Administered 2021-02-19: 50 ug via INTRAVENOUS
  Administered 2021-02-21: 100 ug via INTRAVENOUS
  Administered 2021-02-24 – 2021-02-26 (×3): 50 ug via INTRAVENOUS
  Administered 2021-02-26: 100 ug via INTRAVENOUS
  Administered 2021-02-27 – 2021-03-01 (×7): 50 ug via INTRAVENOUS
  Filled 2021-02-17 (×4): qty 1
  Filled 2021-02-17: qty 2
  Filled 2021-02-17 (×2): qty 1
  Filled 2021-02-17 (×2): qty 2
  Filled 2021-02-17 (×3): qty 1
  Filled 2021-02-17: qty 2
  Filled 2021-02-17: qty 1

## 2021-02-17 MED ORDER — GABAPENTIN 250 MG/5ML PO SOLN
100.0000 mg | Freq: Two times a day (BID) | ORAL | Status: DC
  Administered 2021-02-17 – 2021-02-19 (×6): 100 mg
  Filled 2021-02-17 (×7): qty 2

## 2021-02-17 MED ORDER — OXYCODONE HCL 5 MG/5ML PO SOLN
2.5000 mg | Freq: Four times a day (QID) | ORAL | Status: DC
  Administered 2021-02-17 – 2021-02-25 (×26): 2.5 mg
  Filled 2021-02-17 (×26): qty 5

## 2021-02-17 MED ORDER — FENTANYL CITRATE PF 50 MCG/ML IJ SOSY
50.0000 ug | PREFILLED_SYRINGE | INTRAMUSCULAR | Status: DC | PRN
  Administered 2021-02-17: 50 ug via INTRAVENOUS
  Filled 2021-02-17: qty 1

## 2021-02-17 MED ORDER — OXYCODONE HCL 5 MG/5ML PO SOLN
7.5000 mg | Freq: Four times a day (QID) | ORAL | Status: DC
  Administered 2021-02-17: 7.5 mg
  Filled 2021-02-17: qty 10

## 2021-02-17 MED ORDER — PROMETHAZINE HCL 6.25 MG/5ML PO SYRP
12.5000 mg | ORAL_SOLUTION | Freq: Four times a day (QID) | ORAL | Status: DC | PRN
  Administered 2021-02-21: 12.5 mg
  Filled 2021-02-17 (×3): qty 10

## 2021-02-17 NOTE — Progress Notes (Signed)
Patient ID: Thomas [name], male   DOB: [birth year], 65 y.o.   MRN: [id number]  [facility] Progress Note   Assessment/ Plan:   1.  Sepsis/aspiration pneumonia with ventilator dependent respiratory failure: Status post tracheostomy with acute decompensation last week secondary to mucous plugging.  Transferred out of the ICU yesterday and appears to be stable from an oxygenation standpoint via trach collar. 2. Dialysis dependent acute kidney injury on chronic kidney disease stage IIIb: Nonoliguric overnight with 600 cc urine output with worsening azotemia seen on labs with surprising improvement of creatinine over the last 2 days.  He is currently on a TTS schedule with dialysis dependency for the past 2 months or so.  His last hemodialysis treatment was on 10/6 and he is on schedule for dialysis today-I suspect his confusion is in part from his significant azotemia. 3. Anemia: Without overt blood loss and holding ESA in the setting of malignancy.  PRBC transfusion as needed for hemoglobin <8. 4. CKD-MBD: Calcium and phosphorus level currently at acceptable range, continue to monitor on sevelamer. 5.  Stage IIIb colon cancer: Status post partial colectomy/colostomy and cycle 3 out of 4 of chemotherapy.  His last chemotherapy cycle was limited by physical status/deconditioning and toxicity from medications. 6. Hypertension: Blood pressure currently acceptable, continue to follow with ongoing hemodialysis.  Subjective:   Complains of pain on the left side of his abdomen and informs me "he had his last dialysis on Friday".   Objective:   BP 118/65   Pulse 88   Temp 98.1 F (36.7 C) (Axillary)   Resp (!) 24   Ht 5\' 8"  (1.727 m)   Wt 98.9 kg   SpO2 100%   BMI 33.15 kg/m   Physical Exam: Gen: Awake, appears uncomfortable, confused CVS: Pulse regular rhythm, normal rate, S1 and S2 normal Resp: Coarse/transmitted breath sounds bilaterally without rales-oxygen via trach collar.  Left  IJ TDC Abd: Soft, obese, left upper quadrant G-tube Ext: Trace lower extremity edema  Labs: BMET Recent Labs  Lab 02/11/21 0432 02/12/21 0445 02/12/21 0549 02/13/21 0158 02/14/21 0304 02/15/21 0353 02/16/21 0206 02/17/21 0233  NA 138 140 144 140 137 139 142 139  K 4.5 4.6 4.5 3.9 4.0 3.9 4.0 4.7  CL 101 102  --  103 99 102 103 105  CO2 24 21*  --  22 20* 23 25 20*  GLUCOSE 192* 232*  --  124* 222* 160* 130* 176*  BUN 31* 45*  --  45* 61* 78* 89* 105*  CREATININE 4.32* 5.88*  --  5.23* 5.73* 6.08* 5.80* 5.72*  CALCIUM 8.7* 8.9  --  8.7* 8.7* 8.9 9.2 9.0  PHOS 2.7 5.0*  --  4.1 4.5 5.2* 5.0* 4.0   CBC Recent Labs  Lab 02/11/21 1217 02/12/21 0445 02/13/21 0158 02/14/21 0304 02/15/21 0353 02/16/21 0206  WBC 21.7*   < > 21.4* 17.8* 16.7* 15.2*  NEUTROABS 16.1*  --   --   --   --   --   HGB 8.8*   < > 8.4* 7.1* 8.6* 8.7*  HCT 27.3*   < > 25.7* 22.1* 25.2* 26.0*  MCV 86.4   < > 86.2 86.7 85.4 85.0  PLT 286   < > 256 272 285 293   < > = values in this interval not displayed.     Medications:     apixaban  5 mg Oral BID   chlorhexidine  15 mL Mouth Rinse BID   Chlorhexidine  Gluconate Cloth  6 each Topical Q0600   clonazePAM  1 mg Per Tube Q8H   docusate  100 mg Per Tube BID   famotidine  20 mg Per Tube BID   feeding supplement (PROSource TF)  45 mL Per Tube BID   gabapentin  100 mg Per Tube Q12H   guaiFENesin  10 mL Oral Q6H   hydrocerin   Topical BID   insulin aspart  0-15 Units Subcutaneous Q4H   insulin detemir  10 Units Subcutaneous Daily   levETIRAcetam  500 mg Per Tube BID   mouth rinse  15 mL Mouth Rinse q12n4p   mupirocin ointment  1 application Nasal BID   oxyCODONE  7.5 mg Per Tube Q6H   pantoprazole sodium  40 mg Per Tube BID   polyethylene glycol  17 g Per Tube Daily   QUEtiapine  25 mg Oral BID   Elmarie Shiley, MD 02/17/2021, 7:44 AM

## 2021-02-17 NOTE — Consult Note (Signed)
Consultation Note Date: 02/17/2021   Patient Name: Thomas [name]  DOB: Mar 14, 1956  MRN: [id number]  Age / Sex: 65 y.o., male  PCP: [name], NP Referring Physician: Albertine Patricia, MD  Reason for Consultation: Establishing goals of care and Psychosocial/spiritual support  HPI/Patient Profile: 65 y.o. male   admitted on 11/30/2020 with past medical history significant for type 2 diabetes with neuropathy, hypertension, hyperlipidemia, colon cancer status postresection and colostomy in May 2022, oncologist Dr. Benay Spice,  patient status post 3 cycles of chemotherapy in July 2022.  Dosing decreased secondary to hand-and-foot syndrome, decreased renal function and poor performance status.  Hospitalization complicated by A. fib with RVR, hyperglycemic events with lactic acidosis.  Patient required intubation and pressor support, currently extubated with tracheostomy.  Now dependent on HD.  Patient with overall failure to thrive, unfortunate long-term poor prognosis.  Patient and family face treatment option decisions, advanced directive decisions and anticipatory care needs.  Clinical Assessment and Goals of Care:  This NP Wadie Lessen reviewed medical records, received report from team, assessed the patient and then met at the patient's bedside  to discuss diagnosis, prognosis, GOC, EOL wishes disposition and options.  We spoke briefly by phone yesterday    Concept of Palliative Care was introduced as specialized medical care for people and their families living with serious illness.  If focuses on providing relief from the symptoms and stress of a serious illness.  The goal is to improve quality of life for both the patient and the family.  Values and goals of care important to patient and family were attempted to be elicited.  Patient was initially seen by the palliative medicine team on 12/08/2020.    A   discussion was had today regarding advanced directives.  Concepts specific to code status, artifical feeding and hydration, continued IV antibiotics and rehospitalization was had.   Conversation and education had regarding decision regarding continued hemodialysis.  According to primary team patient has refused dialysis over the past few days.  Wife adamantly communicates that she and her family are open to all offered and available medical interventions to prolong life for Thomas [name].  The difference between a aggressive medical intervention path  and a palliative comfort care path for this patient at this time was had.     MOST form was introduced      GOCs in [place] documented on 12-31-20  Created space and opportunity for wife to explore thoughts and feelings regarding her husband's current medical situation.   This has been hard, arduous time for patient and his family.  She second-guesses their decisions regarding chemotherapy.  She verbalizes frustration with communication between herself and medical providers.  Emotional support offered   Questions and concerns addressed.  Patient  encouraged to call with questions or concerns.     PMT will continue to support holistically.         No documented H POA, patient's wife is next of kin and primary decision maker.  SUMMARY OF RECOMMENDATIONS    Code Status/Advance Care Planning: Full code   Palliative Prophylaxis:  Aspiration, Bowel Regimen, Delirium Protocol, Frequent Pain Assessment, and Oral Care  Additional Recommendations (Limitations, Scope, Preferences): Full Scope Treatment  Psycho-social/Spiritual:  Desire for further Chaplaincy support:no   Prognosis:  Unable to determine-will depend on decision regarding life prolonging measures.  Discharge Planning: To Be Determined      Primary Diagnoses: Present on Admission:  Lactic acidosis  Tachycardia  Hyperosmolar non-ketotic state due to type 2 diabetes  mellitus (HCC)  Adverse effect of chemotherapy  Sepsis ([place])  Metabolic acidosis  Acute respiratory failure with hypoxemia ([place])   I have reviewed the medical record, interviewed the patient and family, and examined the patient. The following aspects are pertinent.  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   Dizziness    Dyspnea    pt states d/t weight   Epilepsy (HCC)    takes Keppra daily   GERD (gastroesophageal reflux disease)    takes Omeprazole daily   Headache    Hyperlipidemia    takes Atorvastatin daily   Hypertension    takes Lotrel daily   Peripheral edema    takes Lasix daily   Peripheral neuropathy    takes Gabapentin daily   Pneumonia 3 yrs ago   hx of   Seizures (HCC)    Sleep apnea    Urinary frequency    Social History   Socioeconomic History   Marital status: Married    Spouse name: Cytogeneticist   Number of children: 4   Years of education: Not on file   Highest education level: Not on file  Occupational History   Occupation: Disabled  Tobacco Use   Smoking status: Former   Smokeless tobacco: Never   Tobacco comments:    quit smoking 35 yrs ago  Vaping Use   Vaping Use: Never used  Substance and Sexual Activity   Alcohol use: No   Drug use: No   Sexual activity: Not on file  Other Topics Concern   Not on file  Social History Narrative   Married to his wife, Jerlyn Ly with total of #4 children (#2 with current wife). Lives in home with his son and daughter-in-law.     Social Determinants of Health   Financial Resource Strain: Not on file  Food Insecurity: Not on file  Transportation Needs: Not on file  Physical Activity: Not on file   Stress: Not on file  Social Connections: Not on file   Family History  Problem Relation Age of Onset   Breast cancer Mother    Breast cancer Maternal Aunt    Prostate cancer Maternal Uncle    Scheduled Meds:  apixaban  5 mg Oral BID   chlorhexidine  15 mL Mouth Rinse BID   Chlorhexidine Gluconate Cloth  6 each Topical Q0600   clonazePAM  1 mg Per Tube Q8H   docusate  100 mg Per Tube BID   famotidine  20 mg Per Tube BID   feeding supplement (PROSource TF)  45 mL Per Tube BID   gabapentin  100  mg Per Tube Q12H   guaiFENesin  10 mL Oral Q6H   hydrocerin   Topical BID   insulin aspart  0-15 Units Subcutaneous Q4H   insulin detemir  10 Units Subcutaneous Daily   levETIRAcetam  500 mg Per Tube BID   mouth rinse  15 mL Mouth Rinse q12n4p   mupirocin ointment  1 application Nasal BID   oxyCODONE  7.5 mg Per Tube Q6H   pantoprazole sodium  40 mg Per Tube BID   polyethylene glycol  17 g Per Tube Daily   QUEtiapine  25 mg Oral BID   Continuous Infusions:  sodium chloride 500 mL (02/14/21 2209)   anticoagulant sodium citrate Stopped (02/15/21 2213)   feeding supplement (NEPRO CARB STEADY) 1,000 mL (02/16/21 0143)   promethazine (PHENERGAN) injection (IM or IVPB) 12.5 mg (02/05/21 0416)   PRN Meds:.sodium chloride, acetaminophen (TYLENOL) oral liquid 160 mg/5 mL, albuterol, clonazePAM, dextrose, fentaNYL, fentaNYL (SUBLIMAZE) injection, glycopyrrolate, lip balm, metoprolol tartrate, ondansetron, promethazine (PHENERGAN) injection (IM or IVPB), promethazine, sodium chloride flush Medications Prior to Admission:  Prior to Admission medications   Medication Sig Start Date End Date Taking? Authorizing Provider  acetaminophen (TYLENOL) 500 MG tablet Take 1,000 mg by mouth 3 (three) times daily as needed for headache (pain).   Yes [provider]  albuterol (PROVENTIL HFA;VENTOLIN HFA) 108 (90 BASE) MCG/ACT inhaler Inhale 2 puffs into the lungs every 4 (four) hours as needed for  wheezing or shortness of breath. 04/09/15  Yes Muthersbaugh, Jarrett Soho, PA-C  amLODipine (NORVASC) 10 MG tablet Take 10 mg by mouth daily. 05/11/20  Yes [provider]  capecitabine (XELODA) 500 MG tablet Take 3 tablets (1,500 mg total) by mouth 2 (two) times daily after a meal. 11/19/20  Yes Ladell Pier, MD  gabapentin (NEURONTIN) 300 MG capsule Take 1 capsule (300 mg total) by mouth 3 (three) times daily. 04/06/16  Yes Varney Biles, MD  levETIRAcetam (KEPPRA) 1000 MG tablet Take 1,000 mg by mouth 2 (two) times daily. 07/28/16  Yes [provider]  metFORMIN (GLUCOPHAGE-XR) 500 MG 24 hr tablet Take 500 mg by mouth 2 (two) times daily. 07/27/20  Yes [provider]  omeprazole (PRILOSEC) 20 MG capsule Take 20 mg by mouth daily. 08/10/16  Yes [provider]  atorvastatin (LIPITOR) 20 MG tablet Take 20 mg by mouth daily at 6 PM.    [provider]  lidocaine-prilocaine (EMLA) cream Apply 1 application topically as directed. Apply 1/2 tablespoon to port site 2 hours prior to stick and cover with plastic wrap to numb site Patient not taking: Reported on 10/08/2020 09/24/20   Ladell Pier, MD  ondansetron (ZOFRAN) 8 MG tablet Take 1 tablet (8 mg total) by mouth every 8 (eight) hours as needed for nausea or vomiting. Patient not taking: No sig reported 09/24/20   Ladell Pier, MD  prochlorperazine (COMPAZINE) 10 MG tablet Take 1 tablet (10 mg total) by mouth every 6 (six) hours as needed. Patient not taking: No sig reported 09/24/20   Ladell Pier, MD   Allergies  Allergen Reactions   Morphine And Related Other (See Comments)    Throat swelling   Review of Systems  Unable to perform ROS  Physical Exam Constitutional:      General: He is awake.     Appearance: He is underweight. He is ill-appearing.  Cardiovascular:     Rate and Rhythm: Normal rate.  Pulmonary:     Comments: Trach noted  Skin:  General: Skin is warm and dry.   Neurological:     Mental Status: He is lethargic.    Vital Signs: BP 108/60 (BP Location: Left Arm)   Pulse 91   Temp 99.1 F (37.3 C) (Axillary)   Resp (!) 24   Ht _0  (1.727 m)   Wt 98.9 kg   SpO2 100%   BMI 33.15 kg/m  Pain Scale: FLACC POSS *See Group Information*: S-Acceptable,Sleep, easy to arouse Pain Score: 8    SpO2: SpO2: 100 % O2 Device:SpO2: 100 % O2 Flow Rate: .O2 Flow Rate (L/min): 5 L/min  IO: Intake/output summary:  Intake/Output Summary (Last 24 hours) at 02/17/2021 1029 Last data filed at 02/17/2021 0815 Gross per 24 hour  Intake 1100 ml  Output 1050 ml  Net 50 ml    LBM: Last BM Date: 02/17/21 (via ostomy) Baseline Weight: Weight: 111.8 kg Most recent weight: Weight: 98.9 kg     Palliative Assessment/Data:  20 % at best   Discussed with [name] NP and Dr Doristine Bosworth by secure chat  Time In: 1200 Time Out: 1315 Time Total: 75 minutes Greater than 50%  of this time was spent counseling and coordinating care related to the above assessment and plan.  Signed by: Wadie Lessen, NP   Please contact Palliative Medicine Team phone at [phone] for questions and concerns.  For individual provider: See Shea Evans

## 2021-02-17 NOTE — Progress Notes (Signed)
Speech Language Pathology Treatment: Dysphagia;Passy Muir Speaking valve  Patient Details Name: Thomas [name] MRN: [id number] DOB: March 17, 1956 Today's Date: 02/17/2021 Time: 1230-1300 SLP Time Calculation (min) (ACUTE ONLY): 30 min  Assessment / Plan / Recommendation Clinical Impression  Pt sleepy today.  Roused with wet washcloth to face and repositioning.  PMV placed and pt verbalized minimally.  Provided extensive oral care, removing dried secretions from hard/soft palate and posterior tongue. Pt able to assist with oral suctioning, but not alert enough to engage in tooth brushing.  Pt accepted several ice chips; he required constant verbal cues today to manipulate and move ice posteriorly in order to swallow.  There was weak, wet coughing notable throughout.  Pt demonstrated meaningful attempts to follow commands, but his fatigue level interfered with participation.   PMV removed and pt promptly fell asleep.  REC ongoing frequent and thorough oral care. Encourage ice chips frequently throughout the day.  Encourage PMV use when pt is alert- remove prior to sleep.  SLP will continue to follow for swallowing and speech.    HPI HPI: Pt is a 65 y/o male admitted on 7/25 for worsening sepsis vs reaction to chemotherapy with high ostomy output.  Pt developed worsening hypoxia and was requiring BIPAP w/ FIO2 of 100% and increased work of breathing. SLP consulted due to concern for aspiration. BSE 7/28 recommended NPO status and MBS, but this could not completed subsequently due to lethargy. CXR in AM of 7/30 showed some worsening concern of aspiration. ETT 7/30-8/8; tolerated nasal cannula for about an hour; reintubated 8/8-trach 8/11. Pt pulled out trach x2 8/22; replaced by PCCM. Bronch on 8/22 revealed endotracheal granulation tissue at trach site. Trach removed again on 8/25; unable to reinsert despite multiple attenpts; pt reintubated 8/25- trach by ENT on 8/29. BSE 8/22 limited to ice chips due to  pt's refusal of other consistencies; rx NPO status with Cortrak.MRI brain 9/30: Multiple punctate foci of restricted diffusion in bilateral  frontal white matter, most likely acute infarcts. EGD 9/30:grade C reflux esophagitis/non-bleeding duodenal ulcers.Pt s/p PEG 10/4.  Trach changed to cuffed and pt transferred to ICU on 10/5 due to respiratory distress with mucous plug; bronch compled; vent 10/5-10/6. Bronch 10/5: Significant mucous plugging of the LUL with BAL and suctioning up until the subsegmental region. Trach replaced to #[id number] uncuffed. 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      SLP Plan  Continue with current plan of care      Recommendations for follow up therapy are one component of a multi-disciplinary discharge planning process, led by the attending physician.  Recommendations may be updated based on patient status, additional functional criteria and insurance authorization.    Recommendations  Diet recommendations: NPO Medication Administration: Via alternative means      Patient may use Passy-Muir Speech Valve: During all waking hours (remove during sleep) PMSV Supervision: Intermittent         Oral Care Recommendations: Oral care prior to ice chip/H20;Oral care QID Follow up Recommendations: Skilled Nursing facility SLP Visit Diagnosis: Dysphagia, oropharyngeal phase (R13.12);Aphonia (R49.1) Plan: Continue with current plan of care                    Kateryna Grantham L. Tivis Ringer, [place] Office number [phone]  Pager [phone]    Thomas [name] 02/17/2021, 1:51 PM

## 2021-02-17 NOTE — Progress Notes (Addendum)
[facility] PROGRESS NOTE  Thomas [name] ION:[id number] DOB: 17-Aug-1955 DOA: 11/30/2020 PCP: Thomas Mile, NP  Status: Remains inpatient appropriate because:Persistent severe electrolyte disturbances, Unsafe d/c plan, and Inpatient level of care appropriate due to severity of illness  Dispo: The patient is from: Home              Anticipated d/c is to: SNF versus LTAC vs CIR              Patient currently is not medically stable to d/c.   Difficult to place patient Yes               Barriers to discharge: Currently has tracheostomy in place and is requiring dialysis but remains too weak to tolerate dialysis in chair which is a requirement for outpatient HD   Level of care: Progressive  Code Status: Full Family Communication: Attending physician spoke with wife at bedside 9/26 DVT prophylaxis: FD Lovenox COVID vaccination status: Moderna 07/08/2019 and 08/11/2019 with first booster dose given 08/05/2020   HPI:  65 y.o. male with a history of T2DM with neuropathy, HTN, HLD., colon cancer s/p resection and colostomy May 2022 s/p 3 cycles chemotherapy (last was July 2022). Followed by Dr. Benay [name]. Started CAPOX June 2022, reduced cepecitabine dose starting 11/19/2020 due to hand/foot syndrome, reduced oxaliplatin due to renal impairment and poor performance status. Presented to the ED 7/24 with abdominal pain found to be febrile, tachycardic, tachypneic. Lactic acid was 6.6 and persisted at 6.8 after sepsis IV fluid bolus and cefepime. He had ARF, demand ischemia, bandemia, was hyperglycemic with negative ketones, with lactic acidosis. He developed AFib with RVR in the ED, was started on diltiazem, and was admitted to the ICU for septic shock with unclear source. Sepsis was subsequently felt to be ruled out at time of admission, presentation felt more likely to be related to cepacitabine toxicity with oral mucositis, hand/foot syndrome, and diarrhea/high output colostomy.   He was transferred  to hospitalist service 7/27 but developed metabolic encephalopathy with hyoxia due to aspiration pneumonia on 7/30 requiring intubation and pressor support. Failed extubation with inability to clear secretions, requiring reintubation 8/8, and tracheostomy subsequently placed 8/11. This was pulled out 8/22, replaced under bronch guidance, again removed 8/25 requiring reintubation after failed reinsertion. ENT revised tracheostomy in the OR 8/29. He has required IHD for ARF, and also had complications of RLE DVT now on eliquis, colitis thought to be ischemic 8/2 not requiring specific intervention, Pseudomonas pneumonia having completed treatment, agitated encephalopathy which has improved, anemia requiring 4u PRBCs total, and thrombocytopenia which has resolved. On 9/4 he was transferred to hospitalist service having tolerated trach collar.  Patient was also evaluated by Dr. Benay [name] during this admission clarified that patient was receiving curative chemotherapy prior to admission  On 10/7 patient had an episode of significant hypoxemia due to mucous plugging.  This required brief transfer to the ICU and brief mechanical ventilation until acute bronchoscopy could be completed.  Since that time patient has declined dialysis x2 prompting ENT consultation.  Nephrologist for size to the patient that dialysis is a life-sustaining procedure patient verbalized understanding.  Subjective:  Sleeping briefly awakens during exam but quickly back to sleep.  Objective: Vitals:   02/17/21 0343 02/17/21 0411  BP: (!) 116/93 118/65  Pulse:  88  Resp: 17 (!) 24  Temp: 98.1 F (36.7 C)   SpO2: 100% 100%    Intake/Output Summary (Last 24 hours) at 02/17/2021 2440 Last data filed at  02/17/2021 0600 Gross per 24 hour  Intake 1320 ml  Output 1125 ml  Net 195 ml   Filed Weights   02/14/21 0500 02/15/21 0347 02/16/21 0500  Weight: 99.3 kg 99.7 kg 98.9 kg    Exam: Constitutional: Much more lethargic than  baseline. Respiratory: #6 XLT uncuffed trach, RA to TC, lungs remain clear, sleeping comfortably, no increased work of breathing, pulse oximetry 100% Cardiovascular: S1-S2 without rubs murmurs or gallops.  No JVD or peripheral edema.  Normotensive. Abdomen: Abdomen soft and nontender, PEG site unremarkable, active bowel sounds.  Colostomy stoma pink with liquid stool Genitourinary: Foley in place with dark yellow urine noted Neurologic: CN 2-12 grossly intact.  Patient lethargic and sleeping.  Follow commands at this juncture. Psychiatric: Lethargic and briefly awakens.  Bilateral mittens in place to prevent accidental dislodgment of tube   Assessment/Plan: Acute problems:  Acute hypoxic respiratory failure s/p tracheostomy due to aspiration pneumonia /Recurrent pansensitive Pseudomonas t -Status post bronchoscopy and required short-term assistance with mechanical ventilation last week. Thomas [name] PMV with SLP following  Grade LA C reflux esophagitis/nonbleeding duodenal ulcers -Continue PPI for total of 4-weeks (initiated 9/29) -Continue prn Zofran and Phenergan for recurrent nausea -Follow-up EGD recommended after discharge  Uremic encephalopathy -Patient with worsening mental status in context of progressively increasing BUN due to refusal of dialysis x2 -BUN now 105 previous readings around 45  Inability to elevate tongue  -Neurology has documented issues not related to recent stroke or recurrent intubation  Urinary retention -Did not tolerate discontinuation of Foley catheter recently so reinserted on 10/10  Dysphagia/protein calorie malnutrition -PEG placed 10/4 -Continue tube feeding with nutritional supplements/protein supplements  MRI with bilateral punctate lesion (Frontal [place]) -Imaging consistent with acute infarcts -Stroke team differential includes thrombotic etiology from either A. fib or from DVT if has PFO-continue Eliquis -See above regarding uremic  encephalopathy -Recommendation is follow-up MRI in 6 to 8 weeks with and without contrast since not clear as to whether this could also be metastatic in etiology  Acute renal failure on stage IIIb CKD/Presumed ischemic ATN -started CRRT 7/31 > iHD 8/12 s/p left IJ [facility] 8/15.   -Has refused HD x2 sessions with subsequent progression and BUN now 105.  Patient with sx's c/w uremic encephalopathy   Anemia of critical illness, acute blood loss as well as anemia of chronic disease and iron deficiency:  - s/p 4u PRBCs this hospitalization and status post IV iron per nephrology -10/10 Recent hemoglobin down to 8.7 but suspect hemodilution from missed HD   Stage IIIb colon CA s/p partial transverse colectomy with end colostomy May 2022:  - Palliative care has been involved during this hospitalization- goals at that time were for full aggressive measures  -Given recent issues with encephalopathy and refusal to participate with HD PMT once again involved and will begin discussion with patient's wife -Documented patient was on curative chemotherapy prior to admission  Physical deconditioning/back pain -Secondary to prolonged critical illness and 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   Uncontrolled T2DM on OHAs prior to admission with associated neuropathy:  -HbA1c 8.7% at admission. - Continue levemir and SSI  -Gabapentin resumed as above   HTN:  -P controlled off of Norvasc   HLD:  -cont  Atorvastatin   ?Sepsis - initially thought to be present on admission- has been ruled out.    Pressure Injury 02/05/21 Coccyx Lower Stage 2 -  Partial thickness loss of dermis presenting as a shallow open injury with a red, pink wound bed without slough. healing quarter sized spot on sacrum (Active)  Date First Assessed/Time First Assessed: 02/05/21 0800   Location: Coccyx  Location Orientation: Lower  Staging: Stage 2 -  Partial thickness loss of dermis presenting as a shallow open injury with a red, pink wound bed without slough.  Wound Descript...    Assessments 02/05/2021  5:10 PM 02/16/2021  7:30 PM  Dressing Type Foam - Lift dressing to assess site every shift --  Dressing Changed Clean;Dry;Intact  State of Healing Early/partial granulation --  Site / Wound Assessment Pink --  % Wound base Red or Granulating 100% --  Wound Length (cm) 2 cm --  Wound Width (cm) 2 cm --  Wound Surface Area (cm^2) 4 cm^2 --  Margins Attached edges (approximated) --  Drainage Amount None --  Treatment Cleansed --     No Linked orders to display     Data Reviewed: Basic Metabolic Panel: Recent Labs  Lab 02/13/21 0158 02/14/21 0304 02/15/21 0353 02/16/21 0206 02/17/21 0233  NA 140 137 139 142 139  K 3.9 4.0 3.9 4.0 4.7  CL 103 99 102 103 105  CO2 22 20* 23 25 20*  GLUCOSE 124* 222* 160* 130* 176*  BUN 45* 61* 78* 89* 105*  CREATININE 5.23* 5.73* 6.08* 5.80* 5.72*  CALCIUM 8.7* 8.7* 8.9 9.2 9.0  PHOS 4.1 4.5 5.2* 5.0* 4.0   Liver Function Tests: Recent Labs  Lab 02/13/21 0158 02/14/21 0304 02/15/21 0353 02/16/21 0206 02/17/21 0233  ALBUMIN 2.4* 2.1* 2.1* 2.1* 2.2*    CBC: Recent Labs  Lab 02/11/21 1217 02/12/21 0445 02/12/21 0549 02/13/21 0158 02/14/21 0304 02/15/21 0353 02/16/21 0206  WBC 21.7* 27.0*  --  21.4* 17.8* 16.7* 15.2*  NEUTROABS 16.1*  --   --   --   --   --   --   HGB 8.8* 8.8* 8.8* 8.4* 7.1* 8.6* 8.7*  HCT 27.3* 26.7* 26.0* 25.7* 22.1* 25.2* 26.0*  MCV  86.4 87.3  --  86.2 86.7 85.4 85.0  PLT 286 253  --  256 272 285 293    CBG: Recent Labs  Lab 02/16/21 1105 02/16/21 1555 02/16/21 2113 02/16/21 2316 02/17/21 0315  GLUCAP 158* 125* 148* 194* 156*    Scheduled Meds:  apixaban  5 mg Oral BID   chlorhexidine  15 mL Mouth Rinse BID   Chlorhexidine Gluconate Cloth  6 each Topical Q0600   clonazePAM  1 mg Per Tube Q8H   docusate  100 mg Per Tube BID   famotidine  20 mg Per Tube BID   feeding supplement (PROSource TF)  45 mL Per Tube BID   gabapentin  100 mg Per Tube Q12H   guaiFENesin  10 mL Oral Q6H   hydrocerin   Topical BID   insulin aspart  0-15 Units Subcutaneous Q4H   insulin detemir  10 Units Subcutaneous Daily   levETIRAcetam  500 mg Per Tube BID   mouth rinse  15 mL Mouth Rinse q12n4p   mupirocin ointment  1 application Nasal BID   oxyCODONE  7.5 mg Per Tube Q6H   pantoprazole sodium  40 mg Per Tube BID   polyethylene glycol  17 g Per Tube Daily   QUEtiapine  25 mg Oral BID   Continuous Infusions:  sodium chloride 500 mL (02/14/21 2209)   anticoagulant sodium citrate Stopped (02/15/21 2213)   feeding supplement (NEPRO CARB STEADY) 1,000 mL (02/16/21 0143)   promethazine (PHENERGAN) injection (IM or IVPB) 12.5 mg (02/05/21 0416)    Principal Problem:   Diabetes mellitus with hyperosmolarity without hyperglycemic hyperosmolar nonketotic coma (HCC) Active Problems:   Lactic acidosis   Acute renal failure superimposed on stage 2 chronic kidney disease (HCC)   Metabolic acidosis   Tachycardia   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 (HCC)   Adverse effect of chemotherapy   Pressure injury of skin   Atrial fibrillation with RVR ([place])   Sepsis ([place])   Malnutrition of moderate degree   Acute metabolic encephalopathy   Acute respiratory failure with hypoxemia (HCC)   Acute renal failure with acute cortical necrosis (HCC)   Shock ([place])   Status post tracheostomy ([place])   Endotracheal tube  present   Hemodialysis status ([place])   Palliative care by specialist   SOB (shortness of breath)   Non-intractable vomiting   Abdominal pain, epigastric   Cerebral embolism with cerebral infarction   Acute esophagitis   Duodenal ulcer disease   Consultants: PCCM General surgery Nephrology Hematology/oncology Palliative care medicine team  Procedures: 2u PRBC 7/31, 1u PRBC 8/17, 1u PRBC 8/27 Tracheostomy revision 01/05/2021 Dr. Constance Holster [facility] 8/22 10/7 briefly changed to cuffed trach to allow for short-term mechanical ventilation and subsequently changed back to cuffless trach on 10/8    Antibiotics: Vancomycin 7/24 Cefepime 7/24 - 7/27 Unasyn 7/30 >> cefepime/flagyl 7/31 - 8/6  Cefazolin x1 dose on 10/ Zosyn 10/5 through 10/9 Vancomycin 10/5 through 10/6    Time spent: 40 minutes    [name] ANP  [facility] 7 am - 330 pm/M-F for direct patient care and secure chat Please refer to Amion for contact info 78  days

## 2021-02-17 NOTE — Progress Notes (Signed)
Physical Therapy Treatment Patient Details Name: Thomas [name] MRN: [id number] DOB: [birth year] Today's Date: 02/17/2021   History of Present Illness 65 y.o. male admitted 7/24 with high ostomy output. Pt with severe sepsis and multisystem organ failure. Hospital admission complicated by volume overload, aspiration pneumonia, acute respiratory failure, AKI and ischemic colitis. Intubated 7/30. S/p trach placement 8/11. CorTrak 8/12. HD initiated 8/12. To IR 8/15 for tunneled HD cath. Pulled out trach x2 8/22 replaced by PCCM. Pulled out trach again 8/25 but unable to be reinserted with pt intubated. 8/29 to OR for stoma clean up and trach replacement.Trach downsized 9/10. 10/5 respiratory distress with mucous plug requiring bronch, trach change and ventilator 10/5-10/6.  10/6 transitioned to trach collar, 10/7 pulled trach and inserted by RT with no complications. 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    PT Comments    Patient received in bed, lethargic. Able to rouse him a bit, however session quite limited due to pain and lethargy. Needed totalAx2 for rolling and not really able to tolerate even gastroc/soleus stretching today due to pain. Had very limited cue following but was able to respond appropriately verbally (using PMV) and non-verbally about 30-40% of the time today.  Left in bed positioned to comfort with all needs met, bed alarm active. Will continue efforts- might get most benefit out of LTACH at this point.    Recommendations for follow up therapy are one component of a multi-disciplinary discharge planning process, led by the attending physician.  Recommendations may be updated based on patient status, additional functional criteria and insurance authorization.  Follow Up  Recommendations  SNF;Other (comment) (vs LTACH)     Equipment Recommendations  Wheelchair (measurements PT);Wheelchair cushion (measurements PT);3in1 (PT);Rolling walker with 5" wheels    Recommendations for Other Services       Precautions / Restrictions Precautions Precautions: Fall Precaution Comments: R subclavian tunneled HD cath, ostomy, peg, trach Restrictions Weight Bearing Restrictions: No     Mobility  Bed Mobility Overal bed mobility: Needs Assistance Bed Mobility: Rolling Rolling: Total assist;+2 for physical assistance         General bed mobility comments: attempted rolling with totalAx2, unable due to pain    Transfers                 General transfer comment: deferred  Ambulation/Gait             General Gait Details: not able yet   Stairs             Wheelchair Mobility    Modified Rankin (Stroke Patients Only)       Balance Overall balance assessment: Needs assistance Sitting-balance support: Bilateral upper extremity supported;Feet unsupported Sitting balance-Leahy Scale: Poor                                      Cognition Arousal/Alertness: Lethargic Behavior During Therapy: Flat affect Overall Cognitive Status: Difficult to assess                     Current Attention Level: Focused   Following Commands: Follows one step commands inconsistently Safety/Judgement: Decreased awareness of safety;Decreased awareness of deficits Awareness: Intellectual Problem Solving: Slow processing;Requires verbal cues;Requires tactile cues General Comments:  lethargic today but able to shake head yes/no appropriately about 40% of the time to simple cues/commands; said one word answers about 30% of the time, but still very much limited by lethargy      Exercises      General Comments General comments (skin integrity, edema, etc.): very lethargic today, difficult to progress mobility      Pertinent  Vitals/Pain Pain Assessment: Faces Faces Pain Scale: Hurts even more Pain Location: B feet Pain Descriptors / Indicators: Grimacing;Guarding;Restless Pain Intervention(s): Limited activity within patient's tolerance;Monitored during session    Home Living                      Prior Function            PT Goals (current goals can now be found in the care plan section) Acute Rehab PT Goals Patient Stated Goal: none stated today PT Goal Formulation: With patient Time For Goal Achievement: 02/27/21 Potential to Achieve Goals: Fair Progress towards PT goals: Not progressing toward goals - comment (limited participation/lethargy)    Frequency    Min 2X/week      PT Plan Discharge plan needs to be updated    Co-evaluation              AM-PAC PT "6 Clicks" Mobility   Outcome Measure  Help needed turning from your back to your side while in a flat bed without using bedrails?: Total Help needed moving from lying on your back to sitting on the side of a flat bed without using bedrails?: Total Help needed moving to and from a bed to a chair (including a wheelchair)?: Total Help needed standing up from a chair using your arms (e.g., wheelchair or bedside chair)?: Total Help needed to walk in hospital room?: Total Help needed climbing 3-5 steps with a railing? : Total 6 Click Score: 6    End of Session Equipment Utilized During Treatment: Oxygen Activity Tolerance: Patient limited by pain Patient left: in bed;with call bell/phone within reach;with bed alarm set Nurse Communication: Mobility status PT Visit Diagnosis: Muscle weakness (generalized) (M62.81);Other abnormalities of gait and mobility (R26.89)     Time: 2947-6546 PT Time Calculation (min) (ACUTE ONLY): 14 min  Charges:  $Therapeutic Activity: 8-22 mins                    Windell Norfolk, DPT, PN2   Supplemental Physical Therapist [place]    Pager [phone] Acute Rehab Office  [phone]

## 2021-02-18 DIAGNOSIS — Z930 Tracheostomy status: Secondary | ICD-10-CM | POA: Diagnosis not present

## 2021-02-18 DIAGNOSIS — E1100 Type 2 diabetes mellitus with hyperosmolarity without nonketotic hyperglycemic-hyperosmolar coma (NKHHC): Secondary | ICD-10-CM | POA: Diagnosis not present

## 2021-02-18 DIAGNOSIS — Z515 Encounter for palliative care: Secondary | ICD-10-CM | POA: Diagnosis not present

## 2021-02-18 DIAGNOSIS — R1310 Dysphagia, unspecified: Secondary | ICD-10-CM | POA: Diagnosis not present

## 2021-02-18 DIAGNOSIS — J9621 Acute and chronic respiratory failure with hypoxia: Secondary | ICD-10-CM

## 2021-02-18 DIAGNOSIS — Z992 Dependence on renal dialysis: Secondary | ICD-10-CM | POA: Diagnosis not present

## 2021-02-18 LAB — RENAL FUNCTION PANEL
Albumin: 2.2 g/dL — ABNORMAL LOW (ref 3.5–5.0)
Anion gap: 12 (ref 5–15)
BUN: 46 mg/dL — ABNORMAL HIGH (ref 8–23)
CO2: 25 mmol/L (ref 22–32)
Calcium: 9.2 mg/dL (ref 8.9–10.3)
Chloride: 97 mmol/L — ABNORMAL LOW (ref 98–111)
Creatinine, Ser: 3.2 mg/dL — ABNORMAL HIGH (ref 0.61–1.24)
GFR, Estimated: 21 mL/min — ABNORMAL LOW (ref >60)
Glucose, Bld: 179 mg/dL — ABNORMAL HIGH (ref 70–99)
Phosphorus: 3.1 mg/dL (ref 2.5–4.6)
Potassium: 3.8 mmol/L (ref 3.5–5.1)
Sodium: 134 mmol/L — ABNORMAL LOW (ref 135–145)

## 2021-02-18 LAB — GLUCOSE, CAPILLARY
Glucose-Capillary: 191 mg/dL — ABNORMAL HIGH (ref 70–99)
Glucose-Capillary: 186 mg/dL — ABNORMAL HIGH (ref 70–99)
Glucose-Capillary: 173 mg/dL — ABNORMAL HIGH (ref 70–99)
Glucose-Capillary: 186 mg/dL — ABNORMAL HIGH (ref 70–99)
Glucose-Capillary: 148 mg/dL — ABNORMAL HIGH (ref 70–99)
Glucose-Capillary: 146 mg/dL — ABNORMAL HIGH (ref 70–99)

## 2021-02-18 MED ORDER — DICLOFENAC SODIUM 1 % EX GEL
4.0000 g | Freq: Four times a day (QID) | CUTANEOUS | Status: DC
  Administered 2021-02-18 – 2021-03-13 (×67): 4 g via TOPICAL
  Filled 2021-02-18 (×3): qty 100

## 2021-02-18 MED ORDER — FREE WATER
100.0000 mL | Freq: Four times a day (QID) | Status: DC
  Administered 2021-02-18 – 2021-02-25 (×26): 100 mL

## 2021-02-18 NOTE — Progress Notes (Signed)
Patient ID: Thomas [name], male   DOB: August 02, 1955, 65 y.o.   MRN: [id number]  [facility] Progress Note   Assessment/ Plan:   1.  Sepsis/aspiration pneumonia with ventilator dependent respiratory failure: Status post tracheostomy with acute decompensation last week secondary to mucous plugging.  Appears to be stable from an oxygenation standpoint via trach collar.  Mental status continues to wax and wane. 2. Dialysis dependent acute kidney injury on chronic kidney disease stage IIIb: Oliguric overnight with 300 cc urine output.  With dense acute kidney injury and dialysis dependency for the last 2 months-at this time, without evidence of renal recovery and will maintain him on a TTS dialysis schedule.  Dialysis has been ordered again for tomorrow.  He is physically not in a position for outpatient dialysis with profound weakness and inability to tolerate prolonged positioning in recliner. 3. Anemia: Without overt blood loss and holding ESA in the setting of malignancy.  PRBC transfusion as needed for hemoglobin <8. 4. CKD-MBD: Calcium and phosphorus level currently at acceptable range, continue to monitor on sevelamer. 5.  Stage IIIb colon cancer: Status post partial colectomy/colostomy and cycle 3 out of 4 of chemotherapy.  His last chemotherapy cycle was limited by physical status/deconditioning and toxicity from medications. 6. Hypertension: Blood pressure currently acceptable, continue to follow with ongoing hemodialysis.  Subjective:   Underwent hemodialysis yesterday without problems, no complaints at this time   Objective:   BP 103/79 (BP Location: Right Arm)   Pulse 93   Temp 98 F (36.7 C) (Axillary)   Resp 19   Ht 5\' 8"  (1.727 m)   Wt 104.2 kg   SpO2 97%   BMI 34.93 kg/m   Physical Exam: Gen: Somnolent, arouses with difficulty CVS: Pulse regular rhythm, normal rate, S1 and S2 normal Resp: Coarse/transmitted breath sounds bilaterally without rales-oxygen via trach  collar.  Left IJ TDC Abd: Soft, obese, left upper quadrant G-tube and right lower quadrant colostomy Ext: No lower extremity  Labs: BMET Recent Labs  Lab 02/12/21 0445 02/12/21 0549 02/13/21 0158 02/14/21 0304 02/15/21 0353 02/16/21 0206 02/17/21 0233  NA 140 144 140 137 139 142 139  K 4.6 4.5 3.9 4.0 3.9 4.0 4.7  CL 102  --  103 99 102 103 105  CO2 21*  --  22 20* 23 25 20*  GLUCOSE 232*  --  124* 222* 160* 130* 176*  BUN 45*  --  45* 61* 78* 89* 105*  CREATININE 5.88*  --  5.23* 5.73* 6.08* 5.80* 5.72*  CALCIUM 8.9  --  8.7* 8.7* 8.9 9.2 9.0  PHOS 5.0*  --  4.1 4.5 5.2* 5.0* 4.0   CBC Recent Labs  Lab 02/11/21 1217 02/12/21 0445 02/13/21 0158 02/14/21 0304 02/15/21 0353 02/16/21 0206  WBC 21.7*   < > 21.4* 17.8* 16.7* 15.2*  NEUTROABS 16.1*  --   --   --   --   --   HGB 8.8*   < > 8.4* 7.1* 8.6* 8.7*  HCT 27.3*   < > 25.7* 22.1* 25.2* 26.0*  MCV 86.4   < > 86.2 86.7 85.4 85.0  PLT 286   < > 256 272 285 293   < > = values in this interval not displayed.     Medications:     apixaban  5 mg Oral BID   chlorhexidine  15 mL Mouth Rinse BID   Chlorhexidine Gluconate Cloth  6 each Topical Q0600   clonazePAM  1 mg  Per Tube Q8H   docusate  100 mg Per Tube BID   famotidine  20 mg Per Tube BID   feeding supplement (PROSource TF)  45 mL Per Tube BID   gabapentin  100 mg Per Tube Q12H   guaiFENesin  10 mL Oral Q6H   hydrocerin   Topical BID   insulin aspart  0-15 Units Subcutaneous Q4H   insulin detemir  10 Units Subcutaneous Daily   levETIRAcetam  500 mg Per Tube BID   mouth rinse  15 mL Mouth Rinse q12n4p   mupirocin ointment  1 application Nasal BID   oxyCODONE  2.5 mg Per Tube Q6H   pantoprazole sodium  40 mg Per Tube BID   polyethylene glycol  17 g Per Tube Daily   QUEtiapine  25 mg Oral BID   Elmarie Shiley, MD 02/18/2021, 7:17 AM

## 2021-02-18 NOTE — Progress Notes (Addendum)
[facility] PROGRESS NOTE  Thomas [name] WSF:[id number] DOB: Oct 31, 1955 DOA: 11/30/2020 PCP: [name], NP  Status: Remains inpatient appropriate because:Persistent severe electrolyte disturbances, Unsafe d/c plan, and Inpatient level of care appropriate due to severity of illness  Dispo: The patient is from: Home              Anticipated d/c is to: SNF versus LTAC vs CIR              Patient currently is not medically stable to d/c.   Difficult to place patient Yes               Barriers to discharge: Currently has tracheostomy in place and is requiring dialysis but remains too weak to tolerate dialysis in chair which is a requirement for outpatient HD   Level of care: Progressive  Code Status: Full Family Communication: Attending physician spoke with wife at bedside 9/26 DVT prophylaxis: FD Lovenox COVID vaccination status: Moderna 07/08/2019 and 08/11/2019 with first booster dose given 08/05/2020   HPI:  65 y.o. male with a history of T2DM with neuropathy, HTN, HLD., colon cancer s/p resection and colostomy May 2022 s/p 3 cycles chemotherapy (last was July 2022). Followed by Dr. Benay Spice. Started CAPOX June 2022, reduced cepecitabine dose starting 11/19/2020 due to hand/foot syndrome, reduced oxaliplatin due to renal impairment and poor performance status. Presented to the ED 7/24 with abdominal pain found to be febrile, tachycardic, tachypneic. Lactic acid was 6.6 and persisted at 6.8 after sepsis IV fluid bolus and cefepime. He had ARF, demand ischemia, bandemia, was hyperglycemic with negative ketones, with lactic acidosis. He developed AFib with RVR in the ED, was started on diltiazem, and was admitted to the ICU for septic shock with unclear source. Sepsis was subsequently felt to be ruled out at time of admission, presentation felt more likely to be related to cepacitabine toxicity with oral mucositis, hand/foot syndrome, and diarrhea/high output colostomy.   He was transferred  to hospitalist service 7/27 but developed metabolic encephalopathy with hyoxia due to aspiration pneumonia on 7/30 requiring intubation and pressor support. Failed extubation with inability to clear secretions, requiring reintubation 8/8, and tracheostomy subsequently placed 8/11. This was pulled out 8/22, replaced under bronch guidance, again removed 8/25 requiring reintubation after failed reinsertion. ENT revised tracheostomy in the OR 8/29. He has required IHD for ARF, and also had complications of RLE DVT now on eliquis, colitis thought to be ischemic 8/2 not requiring specific intervention, Pseudomonas pneumonia having completed treatment, agitated encephalopathy which has improved, anemia requiring 4u PRBCs total, and thrombocytopenia which has resolved. On 9/4 he was transferred to hospitalist service having tolerated trach collar.  Patient was also evaluated by Dr. Benay Spice during this admission clarified that patient was receiving curative chemotherapy prior to admission  On 10/7 patient had an episode of significant hypoxemia due to mucous plugging.  This required brief transfer to the ICU and brief mechanical ventilation until acute bronchoscopy could be completed.  Since that time patient has declined dialysis x2 prompting ENT consultation.  Nephrologist for size to the patient that dialysis is a life-sustaining procedure patient verbalized understanding.  As of 10/12 patient was back to baseline mentation.  He was reporting improved pain control and stating that he wanted to resume dialysis as scheduled for Thursday 10/13.  Subjective:  Awake.  Speech difficult to understand due to known issues with tongue elevation and persistent dry mouth from oral breathing.  Patient was able to convey that pain much  better controlled and he slept better last night.  Verbalizes that he wants to resume dialysis tomorrow 10/13 as scheduled.  Had mitts on both hands and was very frustrated and asked to have them  removed.  Mitts were removed and patient sat with hands folded across chest watching TV.  Patient reports some mild pleuritic chest discomfort and left upper chest  Objective: Vitals:   02/18/21 0324 02/18/21 0430  BP: 103/79   Pulse: 97 93  Resp: 19 19  Temp: 98 F (36.7 C)   SpO2: 99% 97%    Intake/Output Summary (Last 24 hours) at 02/18/2021 0740 Last data filed at 02/17/2021 2030 Gross per 24 hour  Intake 1009.67 ml  Output 2700 ml  Net -1690.33 ml   Filed Weights   02/15/21 0347 02/16/21 0500 02/17/21 1610  Weight: 99.7 kg 98.9 kg 104.2 kg    Exam: Constitutional: Awake and requesting to have bilateral mittens removed Respiratory: #6 XLT uncuffed trach, RA to TC, lungs remain clear, sleeping comfortably, no increased work of breathing, pulse oximetry 100% Cardiovascular: S1-S2 without rubs murmurs or gallops.  No JVD or peripheral edema.  Normotensive. Abdomen: Abdomen soft and nontender, PEG site unremarkable, active bowel sounds.  Colostomy stoma pink with liquid stool Genitourinary: Foley in place with dark yellow urine noted Neurologic: CN 2-12 grossly intact.  Sensation intact.  Strength 3-4/5 in both upper and lower extremities. Psychiatric: Much more awake as compared to previous day.  Able to communicate.  Oriented x3.   Assessment/Plan: Acute problems:  Acute hypoxic respiratory failure s/p tracheostomy due to aspiration pneumonia /Recurrent pansensitive Pseudomonas tracheobronchitis with mucous plugging on 10/7 -Status post bronchoscopy  -Tolerating PMV with SLP following  Goals of care -Repeat goals of care meeting in process with PMT-as of 10/12 patient affect much more improved noting improved pain control.  He is now agreeable to resuming hemodialysis.  Grade LA C reflux esophagitis/nonbleeding duodenal ulcers -Continue PPI for total of 4-weeks (initiated 9/29) -Continue prn Zofran and Phenergan for recurrent nausea -Follow-up EGD recommended after  discharge  Uremic encephalopathy -Patient with worsening mental status in context of progressively increasing BUN due to refusal of dialysis x2 -BUN now 105 previous readings around 45 and as of 10/12 BUN has decreased to 46 -As of 10/12 mentation essentially back to baseline although he is a little bit sleepy but is better oriented  Inability to elevate tongue  -Neurology has documented issues not related to recent stroke or recurrent intubation -SLP also noted during MBS that with attempts at swallowing patient was unable to move muscles and neck and there was no visible movement of hyoid bone on fluoroscopy  Urinary retention -Did not tolerate discontinuation of Foley catheter so reinserted on 10/10  Dysphagia/protein calorie malnutrition -PEG placed 10/4 -Continue tube feeding with nutritional supplements/protein supplements -10/12 begin free water 100 cc every 6 hours  MRI with bilateral punctate lesion (Frontal watershed) -Stroke team differential included thrombotic etiology from either A. fib or from DVT if has PFO-continue Eliquis -Recommendation is follow-up MRI in 6 to 8 weeks with and without contrast since not clear as to whether this could also be metastatic in etiology  Acute renal failure on stage IIIb CKD/Presumed ischemic ATN -started CRRT 7/31 > iHD 8/12 s/p left IJ [facility] 8/15.   -Newly oliguric over the past 24 hours with <100 cc out per Foley.  Nephrology does not believe he will regain renal function -Patient has agreed to resume dialysis on 10/13 -Currently too debilitated  to tolerate requirements for outpatient HD (transport to and from and dialysis in chair for 3.5 hours+)   Anemia of critical illness, acute blood loss as well as anemia of chronic disease and iron deficiency:  - s/p 4u PRBCs this hospitalization and status post IV iron per nephrology -10/10 Recent hemoglobin down to 8.7 but suspect hemodilution from missed HD-repeat in a.m. 10/13 as well as after  dialysis on a.m. 10/14   Stage IIIb colon CA s/p partial transverse colectomy with end colostomy May 2022:  - Palliative care has spoken again with wife on 10/11 given patient refusing dialysis at that time and mental status changes.  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   Uncontrolled T2DM on OHAs prior to admission with associated neuropathy:  -HbA1c 8.7% at admission. - Continue levemir and SSI  -Gabapentin resumed as above   HTN:  -BP controlled off of Norvasc   HLD:  -cont Atorvastatin   ?Sepsis - initially thought to be present on admission- has been ruled out.    Pressure Injury 02/05/21 Coccyx Lower Stage 2 -  Partial thickness loss of dermis presenting as a shallow open injury with a red, pink wound bed without slough. healing quarter sized spot on sacrum (Active)  Date First Assessed/Time First Assessed: 02/05/21 0800   Location: Coccyx  Location Orientation: Lower  Staging: Stage 2 -  Partial thickness loss of dermis presenting as a shallow open injury with a red, pink wound bed without slough.  Wound Descript...    Assessments 02/05/2021  5:10 PM 02/17/2021  8:27 PM  Dressing Type Foam - Lift dressing to assess site every shift Foam - Lift dressing to assess site every shift  Dressing Changed Clean;Dry;Intact  Dressing Change  Frequency -- PRN  State of Healing Early/partial granulation --  Site / Wound Assessment Pink --  % Wound base Red or Granulating 100% --  Wound Length (cm) 2 cm --  Wound Width (cm) 2 cm --  Wound Surface Area (cm^2) 4 cm^2 --  Margins Attached edges (approximated) --  Drainage Amount None --  Treatment Cleansed Off loading     No Linked orders to display     Data Reviewed: Basic Metabolic Panel: Recent Labs  Lab 02/14/21 0304 02/15/21 0353 02/16/21 0206 02/17/21 0233 02/18/21 0648  NA 137 139 142 139 134*  K 4.0 3.9 4.0 4.7 3.8  CL 99 102 103 105 97*  CO2 20* 23 25 20* 25  GLUCOSE 222* 160* 130* 176* 179*  BUN 61* 78* 89* 105* 46*  CREATININE 5.73* 6.08* 5.80* 5.72* 3.20*  CALCIUM 8.7* 8.9 9.2 9.0 9.2  PHOS 4.5 5.2* 5.0* 4.0 3.1   Liver Function Tests: Recent Labs  Lab 02/14/21 0304 02/15/21 0353 02/16/21 0206 02/17/21 0233 02/18/21 0648  ALBUMIN 2.1* 2.1* 2.1* 2.2* 2.2*    CBC: Recent Labs  Lab 02/11/21 1217 02/12/21  1937 02/12/21 0549 02/13/21 0158 02/14/21 0304 02/15/21 0353 02/16/21 0206  WBC 21.7* 27.0*  --  21.4* 17.8* 16.7* 15.2*  NEUTROABS 16.1*  --   --   --   --   --   --   HGB 8.8* 8.8* 8.8* 8.4* 7.1* 8.6* 8.7*  HCT 27.3* 26.7* 26.0* 25.7* 22.1* 25.2* 26.0*  MCV 86.4 87.3  --  86.2 86.7 85.4 85.0  PLT 286 253  --  256 272 285 293    CBG: Recent Labs  Lab 02/17/21 1225 02/17/21 1459 02/17/21 2031 02/17/21 2254 02/18/21 0322  GLUCAP 192* 180* 112* 143* 191*    Scheduled Meds:  apixaban  5 mg Oral BID   chlorhexidine  15 mL Mouth Rinse BID   Chlorhexidine Gluconate Cloth  6 each Topical Q0600   clonazePAM  1 mg Per Tube Q8H   docusate  100 mg Per Tube BID   famotidine  20 mg Per Tube BID   feeding supplement (PROSource TF)  45 mL Per Tube BID   gabapentin  100 mg Per Tube Q12H   guaiFENesin  10 mL Oral Q6H   hydrocerin   Topical BID   insulin aspart  0-15 Units Subcutaneous Q4H   insulin detemir  10 Units Subcutaneous  Daily   levETIRAcetam  500 mg Per Tube BID   mouth rinse  15 mL Mouth Rinse q12n4p   mupirocin ointment  1 application Nasal BID   oxyCODONE  2.5 mg Per Tube Q6H   pantoprazole sodium  40 mg Per Tube BID   polyethylene glycol  17 g Per Tube Daily   QUEtiapine  25 mg Oral BID   Continuous Infusions:  sodium chloride 500 mL (02/14/21 2209)   anticoagulant sodium citrate Stopped (02/15/21 2213)   feeding supplement (NEPRO CARB STEADY) 1,000 mL (02/18/21 0246)   promethazine (PHENERGAN) injection (IM or IVPB) 12.5 mg (02/05/21 0416)    Principal Problem:   Diabetes mellitus with hyperosmolarity without hyperglycemic hyperosmolar nonketotic coma (HCC) Active Problems:   Lactic acidosis   Acute renal failure superimposed on stage 2 chronic kidney disease (HCC)   Metabolic acidosis   Tachycardia   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 (HCC)   Adverse effect of chemotherapy   Pressure injury of skin   Atrial fibrillation with RVR (HCC)   Sepsis (HCC)   Malnutrition of moderate degree   Acute metabolic encephalopathy   Acute respiratory failure with hypoxemia (HCC)   Acute renal failure with acute cortical necrosis (HCC)   Shock (HCC)   Status post tracheostomy ([place])   Endotracheal tube present   Hemodialysis status ([place])   Palliative care by specialist   SOB (shortness of breath)   Non-intractable vomiting   Abdominal pain, epigastric   Cerebral embolism with cerebral infarction   Acute esophagitis   Duodenal ulcer disease   Consultants: PCCM General surgery Nephrology Hematology/oncology Palliative care medicine team  Procedures: 2u PRBC 7/31, 1u PRBC 8/17, 1u PRBC 8/27 Tracheostomy revision 01/05/2021 Dr. Constance Holster [facility] 8/22 10/7 briefly changed to cuffed trach to allow for short-term mechanical ventilation and subsequently changed back to cuffless trach on 10/8    Antibiotics: Vancomycin 7/24 Cefepime 7/24 - 7/27 Unasyn 7/30 >>  cefepime/flagyl 7/31 - 8/6  Cefazolin x1 dose on 10/ Zosyn 10/5 through 10/9 Vancomycin 10/5 through 10/6    Time spent: 40 minutes    [name] ANP  [facility] 7 am - 330 pm/M-F for direct patient care  and secure chat Please refer to Amion for contact info 79  days

## 2021-02-18 NOTE — Progress Notes (Signed)
CSW spoke with Denyse Amass of [facility] who states she will continue to follow the patient to determine if he becomes appropriate for IPR.  There are no HD beds available at [facility] or [facility].  Madilyn Fireman, MSW, LCSW Transitions of Care  Clinical Social Worker II [phone]

## 2021-02-19 DIAGNOSIS — E1100 Type 2 diabetes mellitus with hyperosmolarity without nonketotic hyperglycemic-hyperosmolar coma (NKHHC): Secondary | ICD-10-CM | POA: Diagnosis not present

## 2021-02-19 LAB — CBC WITH DIFFERENTIAL/PLATELET
Abs Immature Granulocytes: 0.29 10*3/uL — ABNORMAL HIGH (ref 0.00–0.07)
Basophils Absolute: 0.1 10*3/uL (ref 0.0–0.1)
Basophils Relative: 1 %
Eosinophils Absolute: 0.5 10*3/uL (ref 0.0–0.5)
Eosinophils Relative: 4 %
HCT: 25.7 % — ABNORMAL LOW (ref 39.0–52.0)
Hemoglobin: 8.7 g/dL — ABNORMAL LOW (ref 13.0–17.0)
Immature Granulocytes: 3 %
Lymphocytes Relative: 17 %
Lymphs Abs: 2 10*3/uL (ref 0.7–4.0)
MCH: 28.1 pg (ref 26.0–34.0)
MCHC: 33.9 g/dL (ref 30.0–36.0)
MCV: 82.9 fL (ref 80.0–100.0)
Monocytes Absolute: 1 10*3/uL (ref 0.1–1.0)
Monocytes Relative: 9 %
Neutro Abs: 8 10*3/uL — ABNORMAL HIGH (ref 1.7–7.7)
Neutrophils Relative %: 66 %
Platelets: 348 10*3/uL (ref 150–400)
RBC: 3.1 MIL/uL — ABNORMAL LOW (ref 4.22–5.81)
RDW: 14.1 % (ref 11.5–15.5)
WBC: 11.8 10*3/uL — ABNORMAL HIGH (ref 4.0–10.5)
nRBC: 0 % (ref 0.0–0.2)

## 2021-02-19 LAB — RENAL FUNCTION PANEL
Albumin: 2.1 g/dL — ABNORMAL LOW (ref 3.5–5.0)
Anion gap: 13 (ref 5–15)
BUN: 67 mg/dL — ABNORMAL HIGH (ref 8–23)
CO2: 24 mmol/L (ref 22–32)
Calcium: 9.3 mg/dL (ref 8.9–10.3)
Chloride: 94 mmol/L — ABNORMAL LOW (ref 98–111)
Creatinine, Ser: 3.93 mg/dL — ABNORMAL HIGH (ref 0.61–1.24)
GFR, Estimated: 16 mL/min — ABNORMAL LOW (ref >60)
Glucose, Bld: 159 mg/dL — ABNORMAL HIGH (ref 70–99)
Phosphorus: 4.8 mg/dL — ABNORMAL HIGH (ref 2.5–4.6)
Potassium: 3.7 mmol/L (ref 3.5–5.1)
Sodium: 131 mmol/L — ABNORMAL LOW (ref 135–145)

## 2021-02-19 LAB — GLUCOSE, CAPILLARY
Glucose-Capillary: 146 mg/dL — ABNORMAL HIGH (ref 70–99)
Glucose-Capillary: 147 mg/dL — ABNORMAL HIGH (ref 70–99)
Glucose-Capillary: 149 mg/dL — ABNORMAL HIGH (ref 70–99)
Glucose-Capillary: 159 mg/dL — ABNORMAL HIGH (ref 70–99)
Glucose-Capillary: 166 mg/dL — ABNORMAL HIGH (ref 70–99)
Glucose-Capillary: 170 mg/dL — ABNORMAL HIGH (ref 70–99)
Glucose-Capillary: 201 mg/dL — ABNORMAL HIGH (ref 70–99)

## 2021-02-19 MED ORDER — PREGABALIN 25 MG PO CAPS
25.0000 mg | ORAL_CAPSULE | Freq: Once | ORAL | Status: AC
  Administered 2021-02-19: 25 mg
  Filled 2021-02-19: qty 1

## 2021-02-19 MED ORDER — PREGABALIN 25 MG PO CAPS: 25.0000 mg | ORAL_CAPSULE | Freq: Once | ORAL | Status: DC

## 2021-02-19 MED ORDER — HEPARIN SODIUM (PORCINE) 1000 UNIT/ML DIALYSIS: 40.0000 [IU]/kg | INTRAMUSCULAR | Status: DC | PRN

## 2021-02-19 NOTE — Progress Notes (Addendum)
[facility] PROGRESS NOTE  Thomas [name] ZOX:[id number] DOB: 06-25-1955 DOA: 11/30/2020 PCP: [name], NP  Status: Remains inpatient appropriate because:Persistent severe electrolyte disturbances, Unsafe d/c plan, and Inpatient level of care appropriate due to severity of illness  Dispo: The patient is from: Home              Anticipated d/c is to: SNF versus LTAC vs CIR              Patient currently is not medically stable to d/c.   Difficult to place patient Yes               Barriers to discharge: Currently has tracheostomy in place and is requiring dialysis but remains too weak to tolerate dialysis in chair which is a requirement for outpatient HD   Level of care: Progressive  Code Status: Full Family Communication: Attending physician spoke with wife at bedside 9/26-I spoke with wife by phone 10/13 DVT prophylaxis: FD Lovenox COVID vaccination status: Moderna 07/08/2019 and 08/11/2019 with first booster dose given 08/05/2020   HPI:  65 y.o. male with a history of T2DM with neuropathy, HTN, HLD., colon cancer s/p resection and colostomy May 2022 s/p 3 cycles chemotherapy (last was July 2022). Followed by Dr. Benay Spice. Started CAPOX June 2022, reduced cepecitabine dose starting 11/19/2020 due to hand/foot syndrome, reduced oxaliplatin due to renal impairment and poor performance status. Presented to the ED 7/24 with abdominal pain found to be febrile, tachycardic, tachypneic. Lactic acid was 6.6 and persisted at 6.8 after sepsis IV fluid bolus and cefepime. He had ARF, demand ischemia, bandemia, was hyperglycemic with negative ketones, with lactic acidosis. He developed AFib with RVR in the ED, was started on diltiazem, and was admitted to the ICU for septic shock with unclear source. Sepsis was subsequently felt to be ruled out at time of admission, presentation felt more likely to be related to cepacitabine toxicity with oral mucositis, hand/foot syndrome, and diarrhea/high output  colostomy.   He was transferred to hospitalist service 7/27 but developed metabolic encephalopathy with hyoxia due to aspiration pneumonia on 7/30 requiring intubation and pressor support. Failed extubation with inability to clear secretions, requiring reintubation 8/8, and tracheostomy subsequently placed 8/11. This was pulled out 8/22, replaced under bronch guidance, again removed 8/25 requiring reintubation after failed reinsertion. ENT revised tracheostomy in the OR 8/29. He has required IHD for ARF, and also had complications of RLE DVT now on eliquis, colitis thought to be ischemic 8/2 not requiring specific intervention, Pseudomonas pneumonia having completed treatment, agitated encephalopathy which has improved, anemia requiring 4u PRBCs total, and thrombocytopenia which has resolved. On 9/4 he was transferred to hospitalist service having tolerated trach collar.  Patient was also evaluated by Dr. Benay Spice during this admission clarified that patient was receiving curative chemotherapy prior to admission  On 10/7 patient had an episode of significant hypoxemia due to mucous plugging.  This required brief transfer to the ICU and brief mechanical ventilation until acute bronchoscopy could be completed.  Since that time patient has declined dialysis x2 prompting ENT consultation.  Nephrologist for size to the patient that dialysis is a life-sustaining procedure patient verbalized understanding.  As of 10/12 patient was back to baseline mentation.  He was reporting improved pain control and stating that he wanted to resume dialysis as scheduled for Thursday 10/13.  Subjective:  More lethargic today but will wake up and respond-continues to have some anterior chest wall discomfort.  Continues to report willingness to participate  with HD today  Objective: Vitals:   02/18/21 2300 02/19/21 0500  BP:    Pulse: 86 88  Resp: 14 16  Temp:    SpO2: 99% 98%    Intake/Output Summary (Last 24 hours) at  02/19/2021 0744 Last data filed at 02/18/2021 1500 Gross per 24 hour  Intake 862.83 ml  Output 625 ml  Net 237.83 ml   Filed Weights   02/15/21 0347 02/16/21 0500 02/17/21 1610  Weight: 99.7 kg 98.9 kg 104.2 kg    Exam: Constitutional: No acute distress, slightly more lethargic than yesterday Respiratory: #6 XLT uncuffed trach, RA to TC, anterior lung sounds are clear to auscultation.  Anterior chest wall tender with application of stethoscope, pulse oximetry 7 to 99% Cardiovascular: S1-S2, No JVD or peripheral edema.  Normotensive. Abdomen: Abdomen soft and nontender, PEG site unremarkable, normoactive bowel sounds.  Colostomy with liquid stool. Genitourinary: Foley in place with dark yellow urine noted Neurologic: CN 2-12 grossly intact.  Patient lethargic but awakens.  Strength remains significantly weak at 2-3/5 in both upper and lower extremities Psychiatric: Awakens, is oriented to name and place.   Assessment/Plan: Acute problems:  Acute hypoxic respiratory failure s/p tracheostomy due to aspiration pneumonia /Recurrent pansensitive Pseudomonas tracheobronchitis with mucous plugging on 10/7 -Status post bronchoscopy  -Tolerating PMV with SLP following  Goals of care -Repeat goals of care meeting in process with PMT-as of 10/12 patient affect much more improved noting improved pain control.  He is now agreeable to resuming hemodialysis.  Grade LA C reflux esophagitis/nonbleeding duodenal ulcers -Continue PPI for total of 4-weeks (initiated 9/29) -Follow-up EGD recommended after discharge  Uremic encephalopathy -Patient with worsening mental status in context of progressively increasing BUN due to refusal of dialysis x2 -BUN has decreased from 10 5-45 and then back up to 67.  Anticipate improvement after HD today  Inability to elevate tongue  -Neurology has documented issues not related to recent stroke or recurrent intubation -SLP also noted during MBS that with  attempts at swallowing patient was unable to move muscles and neck and there was no visible movement of hyoid bone on fluoroscopy  Urinary retention -Did not tolerate discontinuation of Foley catheter so reinserted on 10/10  Dysphagia/protein calorie malnutrition -PEG placed 10/4 -Continue tube feeding with nutritional supplements/protein supplements -10/12 begin free water 100 cc every 6 hours  MRI with bilateral punctate lesion (Frontal watershed) -Stroke team differential included thrombotic etiology from either A. 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   -Newly oliguric over the past 24 hours with <100 cc out per Foley.  Nephrology does not believe he will regain renal function -Patient has agreed to resume dialysis on 10/13 -Currently too debilitated to tolerate requirements for outpatient HD (transport to and from and dialysis in chair for 3.5 hours+)   Anemia of critical illness, acute blood loss as well as anemia of chronic disease and iron deficiency:  - s/p 4u PRBCs this hospitalization and status post IV iron per nephrology -10/10 Recent hemoglobin down to 8.7 but suspect hemodilution from missed HD-repeat in a.m. 10/13 as well as after dialysis on a.m. 10/14   Stage IIIb colon CA s/p partial transverse colectomy with end colostomy May 2022:  -  Palliative care has spoken again with wife on 10/11 given patient refusing dialysis at that time and mental status changes.  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 -Continue Neurontin-titrate up as needed-has severe pain in feet-onset prior to admission -continue scheduled supplemental Oxy IR   Uncontrolled T2DM on OHAs prior to admission with associated neuropathy:  -HbA1c 8.7% at admission. - Continue levemir and SSI  -Gabapentin resumed as above   HTN:  -BP controlled off of Norvasc   HLD:  -cont Atorvastatin   ?Sepsis - initially thought to be present on admission- has been ruled out.    Pressure Injury 02/05/21 Coccyx Lower Stage 2 -  Partial thickness loss of dermis presenting as a shallow open injury with a red, pink wound bed without slough. healing quarter sized spot on sacrum (Active)  Date First Assessed/Time First Assessed: 02/05/21 0800   Location: Coccyx  Location Orientation: Lower  Staging: Stage 2 -  Partial thickness loss of dermis presenting as a shallow open injury with a red, pink wound bed without slough.  Wound Descript...    Assessments 02/05/2021  5:10 PM 02/18/2021  3:00 PM  Dressing Type Foam - Lift dressing to assess site every shift Foam - Lift dressing to assess site every shift  Dressing Changed Changed  Dressing Change Frequency -- PRN  State of Healing Early/partial granulation Fully granulated  Site / Wound Assessment Pink Clean;Dry;Pink  % Wound base Red or Granulating 100% --  Wound Length (cm) 2 cm 2 cm  Wound Width (cm) 2 cm 1 cm  Wound Depth (cm) -- 0 cm  Wound Surface Area (cm^2) 4 cm^2 2 cm^2  Wound Volume (cm^3) -- 0 cm^3  Margins  Attached edges (approximated) --  Drainage Amount None --  Treatment Cleansed --     No Linked orders to display     Data Reviewed: Basic Metabolic Panel: Recent Labs  Lab 02/15/21 0353 02/16/21 0206 02/17/21 0233 02/18/21 0648 02/19/21 0500  NA 139 142 139 134* 131*  K 3.9 4.0 4.7 3.8 3.7  CL 102 103 105 97* 94*  CO2 23 25 20* 25 24  GLUCOSE 160* 130* 176* 179* 159*  BUN 78* 89* 105* 46* 67*  CREATININE 6.08* 5.80* 5.72* 3.20* 3.93*  CALCIUM 8.9 9.2 9.0 9.2 9.3  PHOS 5.2* 5.0* 4.0 3.1 4.8*   Liver Function Tests: Recent Labs  Lab 02/15/21 0353 02/16/21 0206 02/17/21 0233 02/18/21 0648 02/19/21 0500  ALBUMIN 2.1* 2.1* 2.2* 2.2* 2.1*    CBC: Recent Labs  Lab 02/13/21 0158 02/14/21 0304 02/15/21 0353 02/16/21 0206 02/19/21 0500  WBC 21.4* 17.8* 16.7* 15.2* 11.8*  NEUTROABS  --   --   --   --  8.0*  HGB 8.4* 7.1* 8.6* 8.7* 8.7*  HCT 25.7* 22.1* 25.2* 26.0* 25.7*  MCV 86.2 86.7 85.4 85.0 82.9  PLT  256 272 285 293 348    CBG: Recent Labs  Lab 02/18/21 1640 02/18/21 2040 02/19/21 0100 02/19/21 0409 02/19/21 0530  GLUCAP 148* 146* 170* 166* 159*    Scheduled Meds:  apixaban  5 mg Oral BID   chlorhexidine  15 mL Mouth Rinse BID   Chlorhexidine Gluconate Cloth  6 each Topical Q0600   clonazePAM  1 mg Per Tube Q8H   diclofenac Sodium  4 g Topical QID   docusate  100 mg Per Tube BID   famotidine  20 mg Per Tube BID   feeding supplement (PROSource TF)  45 mL Per Tube BID   free water  100 mL Per Tube Q6H   gabapentin  100 mg Per Tube Q12H   guaiFENesin  10 mL Oral Q6H   hydrocerin   Topical BID   insulin aspart  0-15 Units Subcutaneous Q4H   insulin detemir  10 Units Subcutaneous Daily   levETIRAcetam  500 mg Per Tube BID   mouth rinse  15 mL Mouth Rinse q12n4p   oxyCODONE  2.5 mg Per Tube Q6H   pantoprazole sodium  40 mg Per Tube BID   polyethylene glycol  17 g Per Tube Daily   QUEtiapine  25 mg Oral BID   Continuous Infusions:  sodium  chloride 500 mL (02/14/21 2209)   anticoagulant sodium citrate Stopped (02/15/21 2213)   feeding supplement (NEPRO CARB STEADY) 1,000 mL (02/18/21 0246)   promethazine (PHENERGAN) injection (IM or IVPB) 12.5 mg (02/05/21 0416)    Principal Problem:   Diabetes mellitus with hyperosmolarity without hyperglycemic hyperosmolar nonketotic coma (HCC) Active Problems:   Lactic acidosis   Acute renal failure superimposed on stage 2 chronic kidney disease (HCC)   Metabolic acidosis   Tachycardia   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 (HCC)   Adverse effect of chemotherapy   Pressure injury of skin   Atrial fibrillation with RVR (HCC)   Sepsis (HCC)   Malnutrition of moderate degree   Acute metabolic encephalopathy   Acute respiratory failure with hypoxemia (HCC)   Acute renal failure with acute cortical necrosis (HCC)   Shock (HCC)   Status post tracheostomy ([place])   Endotracheal tube present   Hemodialysis status ([place])   Palliative care by specialist   SOB (shortness of breath)   Non-intractable vomiting   Abdominal pain, epigastric   Cerebral embolism with cerebral infarction   Acute esophagitis   Duodenal ulcer disease   Consultants: PCCM General surgery Nephrology Hematology/oncology Palliative care medicine team  Procedures: 2u PRBC 7/31, 1u PRBC 8/17, 1u PRBC 8/27 Tracheostomy revision 01/05/2021 Dr. Constance Holster [facility] 8/22 10/7 briefly changed to cuffed trach to allow for short-term mechanical ventilation and subsequently changed back to cuffless trach on 10/8    Antibiotics: Vancomycin 7/24 Cefepime 7/24 - 7/27 Unasyn 7/30 >> cefepime/flagyl 7/31 - 8/6  Cefazolin x1 dose on 10/ Zosyn 10/5 through 10/9 Vancomycin 10/5 through 10/6    Time spent: 40 minutes    [name] ANP  [facility] 7 am - 330 pm/M-F for direct patient care and secure chat Please refer to Amion for contact info 80  days

## 2021-02-19 NOTE — Progress Notes (Signed)
OT Cancellation Note  Patient Details Name: Thomas [name] MRN: [id number] DOB: 1955-09-26   Cancelled Treatment:    Reason Eval/Treat Not Completed: Patient at procedure or test/ unavailable. Off floor for HD. OT to check back 10/14.   Thomas [name] OTR/L Supplemental OT, Department of rehab services [phone]   Thomas [name] 02/19/2021, 12:46 PM

## 2021-02-19 NOTE — Progress Notes (Signed)
Patient ID: Thomas [name], male   DOB: [birth year], 65 y.o.   MRN: [id number]  [facility] Progress Note   Assessment/ Plan:   1.  Sepsis/aspiration pneumonia with ventilator dependent respiratory failure: Status post tracheostomy with acute decompensation last week secondary to mucous plugging.  Remains on tracheal collar with oxygen supplementation. 2. Dialysis dependent acute kidney injury on chronic kidney disease stage IIIb: Oliguric overnight with 225 cc urine output.  He unfortunately suffered dense acute kidney injury and has been dialysis dependent for >2 months .  We will continue dialysis on a TTS schedule with dialysis ordered for later today.  He is not physically capable for outpatient dialysis at this time and will need significant OT/PT for physical debilitation arising from prolonged hospitalization/underlying illnesses before being able to tolerate outpatient dialysis. 3. Anemia: Without overt blood loss and holding ESA in the setting of malignancy.  PRBC transfusion as needed for hemoglobin <8. 4. CKD-MBD: Calcium and phosphorus level currently at acceptable range, continue to monitor on sevelamer. 5.  Stage IIIb colon cancer: Status post partial colectomy/colostomy and cycle 3 out of 4 of chemotherapy.  His last chemotherapy cycle was limited by physical status/deconditioning and toxicity from medications. 6. Hypertension: Blood pressure intermittently low, not on antihypertensive therapy-we will evaluate closely for need to start predialysis midodrine.  Subjective:   Without acute events noted overnight, Thomas [name] is nonverbal when seen this morning.   Objective:   BP 105/63 (BP Location: Right Arm)   Pulse 88   Temp 97.6 F (36.4 C) (Axillary)   Resp 16   Ht 5\' 8"  (1.727 m)   Wt 104.2 kg   SpO2 98%   BMI 34.93 kg/m   Physical Exam: Gen: Somnolent, opens eyes to calling his name but does not respond to questions CVS: Pulse regular rhythm, normal rate, S1  and S2 normal Resp: Coarse/transmitted breath sounds bilaterally without rales-oxygen via trach collar.  Left IJ TDC Abd: Soft, obese, left upper quadrant G-tube and right lower quadrant colostomy Ext: No lower extremity  Labs: BMET Recent Labs  Lab 02/13/21 0158 02/14/21 0304 02/15/21 0353 02/16/21 0206 02/17/21 0233 02/18/21 0648 02/19/21 0500  NA 140 137 139 142 139 134* 131*  K 3.9 4.0 3.9 4.0 4.7 3.8 3.7  CL 103 99 102 103 105 97* 94*  CO2 22 20* 23 25 20* 25 24  GLUCOSE 124* 222* 160* 130* 176* 179* 159*  BUN 45* 61* 78* 89* 105* 46* 67*  CREATININE 5.23* 5.73* 6.08* 5.80* 5.72* 3.20* 3.93*  CALCIUM 8.7* 8.7* 8.9 9.2 9.0 9.2 9.3  PHOS 4.1 4.5 5.2* 5.0* 4.0 3.1 4.8*   CBC Recent Labs  Lab 02/14/21 0304 02/15/21 0353 02/16/21 0206 02/19/21 0500  WBC 17.8* 16.7* 15.2* 11.8*  NEUTROABS  --   --   --  8.0*  HGB 7.1* 8.6* 8.7* 8.7*  HCT 22.1* 25.2* 26.0* 25.7*  MCV 86.7 85.4 85.0 82.9  PLT 272 285 293 348     Medications:     apixaban  5 mg Oral BID   chlorhexidine  15 mL Mouth Rinse BID   Chlorhexidine Gluconate Cloth  6 each Topical Q0600   clonazePAM  1 mg Per Tube Q8H   diclofenac Sodium  4 g Topical QID   docusate  100 mg Per Tube BID   famotidine  20 mg Per Tube BID   feeding supplement (PROSource TF)  45 mL Per Tube BID   free water  100 mL  Per Tube Q6H   gabapentin  100 mg Per Tube Q12H   guaiFENesin  10 mL Oral Q6H   hydrocerin   Topical BID   insulin aspart  0-15 Units Subcutaneous Q4H   insulin detemir  10 Units Subcutaneous Daily   levETIRAcetam  500 mg Per Tube BID   mouth rinse  15 mL Mouth Rinse q12n4p   oxyCODONE  2.5 mg Per Tube Q6H   pantoprazole sodium  40 mg Per Tube BID   polyethylene glycol  17 g Per Tube Daily   QUEtiapine  25 mg Oral BID   Thomas Shiley, MD 02/19/2021, 7:43 AM

## 2021-02-19 NOTE — Procedures (Signed)
Patient seen on Hemodialysis. BP (!) 91/51   Pulse (!) 56   Temp 98.4 F (36.9 C)   Resp 18   Ht 5\' 8"  (1.727 m)   Wt 104.2 kg   SpO2 96%   BMI 34.93 kg/m   QB 400, UF goal 2L Tolerating treatment without complaints at this time.  Elmarie Shiley MD [facility] Office # [phone] Pager # [phone] 1:05 PM

## 2021-02-19 NOTE — Progress Notes (Signed)
Nutrition Follow-up  DOCUMENTATION CODES:   Non-severe (moderate) malnutrition in context of chronic illness, Obesity unspecified  INTERVENTION:  Continue TF via PEG tube: - Nepro @ 55 ml/hr (1320 ml/day) - ProSource TF 45 ml BID -Free water flushes per MD, currently 121m Q6H  Tube feeding regimen provides 2456 kcal, 129 grams of protein, and 950 ml of H2O (13583mtotal free water)   NUTRITION DIAGNOSIS:   Moderate Malnutrition related to chronic illness, cancer and cancer related treatments as evidenced by mild fat depletion, mild muscle depletion, percent weight loss.  Ongoing, being addressed via TF  GOAL:   Patient will meet greater than or equal to 90% of their needs  Met via TF  MONITOR:   Diet advancement, Labs, Weight trends, TF tolerance, I & O's, Skin  REASON FOR ASSESSMENT:   Consult Enteral/tube feeding initiation and management  ASSESSMENT:   [age].o. male who is a former with medical history of stage 3 colon cancer s/p partial colectomy and end colostomy on 5/4, recent chemo start with 3rd cycle on 7/22, sleep apnea, seizures, epilepsy, brain tumor, HTN, HLD, GERD, asthma, peripheral neuropathy, urinary frequency, DM, MCA stroke. He presented to the ED via EMS due to abdominal pain x2 weeks, anorexia, and weakness. Admitted for close monitoring for concern of mesenteric ischemia. Patient reported that abdominal pain was worse with eating PTA.  07/31 - CRRT initiated 08/09 - CRRT stopped  [facility] - s/p trach 08/12 - Cortrak placed (tip of tube in stomach) 08[facility]15 [facility] pulled tr[facility] out, replaced under bronch guidance, s/p bronch revealing mild area of granulation tissue of top of trach tube 08/25 - pulled trach out, transferred to ICU, emergently intubated 08/29 - trach replaced with ENT in OR 09/04 - tx out of ICU 09/09 - changed to cuffless trach 09/22 - diet advanced to  clears 09/23 - made NPO  09/27 - TF held 2/2 emesis 09/30 - s/p EGD which revealed grade LA C reflux esophagitis/non-bleeding duodenal ulcers 10/03 - Cortrak removed, pt failed MBSS 10/04 - s/p PEG placement 10/05 - pt placed on vent support, transfer to ICU, s/p bronch showing significant mucus plugging 10/06 - back on trach collar 10/07 - trach changed  Pt had episode of significant hypoxemia due to mucous plugging on 10/7 requiring brief transfer to ICU and brief mechanical ventilation until acute bronchoscopy could be completed. Pt has since declined dialysis x2, but resumed treatments today after discussions with MD and return to baseline mentation. Pt in HD at time of RD visit today. Per RN, pt more lethargic today. Pt remains NPO receiving TF via PEG. SLP following. Current TF orders: Nepro @ 5573mr, 37m46mosource Plus BID, 100ml54me water Q6H.   Admit weight: 111.8 kg Current weight: 104.2 kg  Medications: colace, pepcid, SSI q 4 hours, levemir 10 units daily, protonix, miralax Labs: Na 131 (L), PO4 4.8 (H), hgb 8.7 CBGs: 146-170 x 24 hours  UOP: 225ml 59mhours Colostomy: 400 ml x 24 hours I/Os: -24.5 L since admit  Diet Order:   Diet Order             Diet NPO time specified Except for: Ice Chips  Diet effective now                   EDUCATION NEEDS:   No education needs have been identified at this time  Skin:  Skin  Assessment: Skin Integrity Issues: Stage II: coccyx  Last BM:  10/12 colostomy  Height:   Ht Readings from Last 1 Encounters:  01/01/21 5' 8"  (1.727 m)    Weight:   Wt Readings from Last 1 Encounters:  02/17/21 104.2 kg    Ideal Body Weight:  70 kg  BMI:  Body mass index is 34.93 kg/m.  Estimated Nutritional Needs:   Kcal:  3496-1164  Protein:  120-140 gm  Fluid:  1 L + UOP    Larkin Ina, MS, RD, LDN (she/her/hers) RD pager number and weekend/on-call pager number located in [place].

## 2021-02-19 NOTE — Progress Notes (Signed)
Received message from the nurse that after returning from dialysis, patient was anxious.  He was complaining of pain.  He was given fentanyl 50 mcg, Klonopin as well as oxycodone.  After that he became confused.  His SPO2 is 100% on 5 L.  Patient was seen and examined at the bedside and I also had a lengthy discussion with the wife.  Patient is sleepy but looks anxious as well.  He is having some tremors.  Lungs clear to auscultation.  Slightly tachypneic and tachycardic, likely secondary to anxiety.  He already received Klonopin and typically this works for him and controlling pain also helps with anxiety.  This is not unusual for him to have such sort of anxiety after dialysis.  He is going to get Lyrica 25 mg as well to control his peripheral neuropathy.  We will monitor closely.

## 2021-02-19 NOTE — Progress Notes (Signed)
Patient was confused after returning from dialysis, complaining of pain, hr elevated, gave scheduled pain meds, when ineffective gave prn fentanyl, paged.

## 2021-02-19 NOTE — Progress Notes (Signed)
SLP Cancellation Note  Patient Details Name: NAITHEN RIVENBURG MRN: [id number] DOB: Feb 02, 1956   Cancelled treatment:       Reason Eval/Treat Not Completed: Patient at procedure or test/unavailable (Pt currently at HD. SLP will follow up.)  Irean Kendricks I. Hardin Negus, [place], [place] Office number [phone] Pager [place] 02/19/2021, 2:23 PM

## 2021-02-20 DIAGNOSIS — E1100 Type 2 diabetes mellitus with hyperosmolarity without nonketotic hyperglycemic-hyperosmolar coma (NKHHC): Secondary | ICD-10-CM | POA: Diagnosis not present

## 2021-02-20 LAB — CBC WITH DIFFERENTIAL/PLATELET
Abs Immature Granulocytes: 0.65 10*3/uL — ABNORMAL HIGH (ref 0.00–0.07)
Basophils Absolute: 0.1 10*3/uL (ref 0.0–0.1)
Basophils Relative: 1 %
Eosinophils Absolute: 0.3 10*3/uL (ref 0.0–0.5)
Eosinophils Relative: 2 %
HCT: 27.2 % — ABNORMAL LOW (ref 39.0–52.0)
Hemoglobin: 9.5 g/dL — ABNORMAL LOW (ref 13.0–17.0)
Immature Granulocytes: 4 %
Lymphocytes Relative: 18 %
Lymphs Abs: 2.8 10*3/uL (ref 0.7–4.0)
MCH: 28.3 pg (ref 26.0–34.0)
MCHC: 34.9 g/dL (ref 30.0–36.0)
MCV: 81 fL (ref 80.0–100.0)
Monocytes Absolute: 1.4 10*3/uL — ABNORMAL HIGH (ref 0.1–1.0)
Monocytes Relative: 9 %
Neutro Abs: 10.1 10*3/uL — ABNORMAL HIGH (ref 1.7–7.7)
Neutrophils Relative %: 66 %
Platelets: 391 10*3/uL (ref 150–400)
RBC: 3.36 MIL/uL — ABNORMAL LOW (ref 4.22–5.81)
RDW: 13.9 % (ref 11.5–15.5)
WBC: 15.4 10*3/uL — ABNORMAL HIGH (ref 4.0–10.5)
nRBC: 0 % (ref 0.0–0.2)

## 2021-02-20 LAB — RENAL FUNCTION PANEL
Albumin: 2.3 g/dL — ABNORMAL LOW (ref 3.5–5.0)
Anion gap: 11 (ref 5–15)
BUN: 35 mg/dL — ABNORMAL HIGH (ref 8–23)
CO2: 25 mmol/L (ref 22–32)
Calcium: 9.1 mg/dL (ref 8.9–10.3)
Chloride: 95 mmol/L — ABNORMAL LOW (ref 98–111)
Creatinine, Ser: 2.88 mg/dL — ABNORMAL HIGH (ref 0.61–1.24)
GFR, Estimated: 24 mL/min — ABNORMAL LOW (ref >60)
Glucose, Bld: 156 mg/dL — ABNORMAL HIGH (ref 70–99)
Phosphorus: 2.4 mg/dL — ABNORMAL LOW (ref 2.5–4.6)
Potassium: 3.7 mmol/L (ref 3.5–5.1)
Sodium: 131 mmol/L — ABNORMAL LOW (ref 135–145)

## 2021-02-20 LAB — GLUCOSE, CAPILLARY
Glucose-Capillary: 174 mg/dL — ABNORMAL HIGH (ref 70–99)
Glucose-Capillary: 148 mg/dL — ABNORMAL HIGH (ref 70–99)
Glucose-Capillary: 179 mg/dL — ABNORMAL HIGH (ref 70–99)
Glucose-Capillary: 133 mg/dL — ABNORMAL HIGH (ref 70–99)
Glucose-Capillary: 126 mg/dL — ABNORMAL HIGH (ref 70–99)

## 2021-02-20 MED ORDER — CHLORHEXIDINE GLUCONATE CLOTH 2 % EX PADS
6.0000 | MEDICATED_PAD | Freq: Every day | CUTANEOUS | Status: DC
  Administered 2021-02-20 – 2021-03-09 (×14): 6 via TOPICAL

## 2021-02-20 MED ORDER — GABAPENTIN 250 MG/5ML PO SOLN
100.0000 mg | Freq: Three times a day (TID) | ORAL | Status: DC
  Administered 2021-02-20 – 2021-02-24 (×11): 100 mg
  Filled 2021-02-20 (×14): qty 2

## 2021-02-20 NOTE — Progress Notes (Signed)
[facility] NEPHROLOGY PROGRESS NOTE  Assessment/ Plan:  # Sepsis/aspiration pneumonia with ventilator dependent respiratory failure: Status post tracheostomy, remains on tracheal collar with oxygen supplementation.  Per primary and pulmonary team.  #. Dialysis dependent acute kidney injury on chronic kidney disease stage IIIb: This is due to ischemic ATN and on dialysis dependent for more than 45-month therefore I think he is now ESRD.  Status post HD yesterday and plan for next dialysis tomorrow.  He is not physically capable for outpatient dialysis at this time and will need significant OT/PT for physical debilitation arising from prolonged hospitalization/underlying illnesses before being able to tolerate outpatient dialysis.  # Anemia: Without overt blood loss and holding ESA in the setting of malignancy.  PRBC transfusion as needed for hemoglobin <8.  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  Calcium level acceptable.  Continue to monitor.    #  Stage IIIb colon cancer: Status post partial colectomy/colostomy and cycle 3 out of 4 of chemotherapy.  His last chemotherapy cycle was limited by physical status/deconditioning and toxicity from medications.  # Hypertension: Blood pressure intermittently low, not on antihypertensive therapy-we will evaluate closely for need to start predialysis midodrine.  Subjective: Seen and examined.  No new event.  Had dialysis yesterday.  Remains trach collar on oxygen.  Able to open eyes. Objective Vital signs in last 24 hours: Vitals:   02/20/21 0030 02/20/21 0347 02/20/21 0411 02/20/21 0739  BP:  116/71  126/80  Pulse:  95  88  Resp:  (!) 23  18  Temp:  98.3 F (36.8 C)  98.1 F (36.7 C)  TempSrc:  Axillary  Oral  SpO2: 100% 99% 99% 93%  Weight:      Height:       Weight change:   Intake/Output Summary (Last 24 hours) at 02/20/2021 0810 Last data filed at 02/19/2021 2000 Gross per 24 hour  Intake 1793 ml   Output 2152 ml  Net -359 ml       Labs: Basic Metabolic Panel: Recent Labs  Lab 02/18/21 0648 02/19/21 0500 02/20/21 0351  NA 134* 131* 131*  K 3.8 3.7 3.7  CL 97* 94* 95*  CO2 25 24 25   GLUCOSE 179* 159* 156*  BUN 46* 67* 35*  CREATININE 3.20* 3.93* 2.88*  CALCIUM 9.2 9.3 9.1  PHOS 3.1 4.8* 2.4*   Liver Function Tests: Recent Labs  Lab 02/18/21 0648 02/19/21 0500 02/20/21 0351  ALBUMIN 2.2* 2.1* 2.3*   No results for input(s): LIPASE, AMYLASE in the last 168 hours. No results for input(s): AMMONIA in the last 168 hours. CBC: Recent Labs  Lab 02/14/21 0304 02/15/21 0353 02/16/21 0206 02/19/21 0500 02/20/21 0351  WBC 17.8* 16.7* 15.2* 11.8* 15.4*  NEUTROABS  --   --   --  8.0* 10.1*  HGB 7.1* 8.6* 8.7* 8.7* 9.5*  HCT 22.1* 25.2* 26.0* 25.7* 27.2*  MCV 86.7 85.4 85.0 82.9 81.0  PLT 272 285 293 348 391   Cardiac Enzymes: No results for input(s): CKTOTAL, CKMB, CKMBINDEX, TROPONINI in the last 168 hours. CBG: Recent Labs  Lab 02/19/21 0814 02/19/21 1634 02/19/21 2006 02/19/21 2343 02/20/21 0342  GLUCAP 147* 201* 146* 149* 174*    Iron Studies: No results for input(s): IRON, TIBC, TRANSFERRIN, FERRITIN in the last 72 hours. Studies/Results: No results found.  Medications: Infusions:  sodium chloride 500 mL (02/14/21 2209)   anticoagulant sodium citrate     feeding supplement (NEPRO CARB STEADY) 1,000 mL (02/18/21  0246)   promethazine (PHENERGAN) injection (IM or IVPB) 12.5 mg (02/05/21 0416)    Scheduled Medications:  apixaban  5 mg Oral BID   chlorhexidine  15 mL Mouth Rinse BID   Chlorhexidine Gluconate Cloth  6 each Topical Q0600   clonazePAM  1 mg Per Tube Q8H   diclofenac Sodium  4 g Topical QID   docusate  100 mg Per Tube BID   famotidine  20 mg Per Tube BID   feeding supplement (PROSource TF)  45 mL Per Tube BID   free water  100 mL Per Tube Q6H   gabapentin  100 mg Per Tube Q8H   guaiFENesin  10 mL Oral Q6H   hydrocerin    Topical BID   insulin aspart  0-15 Units Subcutaneous Q4H   insulin detemir  10 Units Subcutaneous Daily   levETIRAcetam  500 mg Per Tube BID   mouth rinse  15 mL Mouth Rinse q12n4p   oxyCODONE  2.5 mg Per Tube Q6H   pantoprazole sodium  40 mg Per Tube BID   polyethylene glycol  17 g Per Tube Daily   QUEtiapine  25 mg Oral BID    have reviewed scheduled and prn medications.  Physical Exam: General:NAD, able to open eyes, somnolent Heart:RRR, s1s2 nl Lungs: Coarse breath sound bilaterally Abdomen:soft, feeding gastric tube and right lower quadrant colostomy present. Extremities:No edema Dialysis Access: Left IJ TDC in place  Kalsey Lull Prasad Amenda Duclos 02/20/2021,8:10 AM  LOS: 81 days

## 2021-02-20 NOTE — Progress Notes (Signed)
SLP Cancellation Note  Patient Details Name: Thomas [name] MRN: [id number] DOB: [birth year]   Cancelled treatment:       Reason Eval/Treat Not Completed: Fatigue/lethargy limiting ability to participate.  Will continue efforts.  Reeder Brisby L. Tivis Ringer, [place] Office number [phone] Pager [phone]    [name] 02/20/2021, 1:55 PM

## 2021-02-20 NOTE — TOC Progression Note (Signed)
Transition of Care [facility]) - Progression Note    Patient Details  Name: Thomas [name] MRN: [id number] Date of Birth: 11/18/1955  Transition of Care [facility]) CM/SW [place], RN Phone Number: 02/20/2021, 1:24 PM  Clinical Narrative:    Case management and MSW with DTP Team continue to follow the patient for needed LTAC/SNf placement at [facility].  No HD beds are available at this time.   Expected Discharge Plan: [place] Barriers to Discharge: Continued Medical Work up  Expected Discharge Plan and Services Expected Discharge Plan: [place]   Discharge Planning Services: CM Consult Post Acute Care Choice: IP Rehab (IP Rehab versus SNF for rehab - has tracheostomy and HD care needs.) Living arrangements for the past 2 months: Single Family Home                                       Social Determinants of Health (SDOH) Interventions    Readmission Risk Interventions Readmission Risk Prevention Plan 02/16/2021  Transportation Screening Complete  Medication Review Press photographer) Complete  PCP or Specialist appointment within 3-5 days of discharge Complete  HRI or Home Care Consult Complete  SW Recovery Care/Counseling Consult Complete  Palliative Care Screening Complete  Skilled Nursing Facility Complete  Some recent data might be hidden

## 2021-02-20 NOTE — Progress Notes (Signed)
[facility] PROGRESS NOTE  [name] KZS:[id number] DOB: 23-Jan-1956 DOA: 11/30/2020 PCP: [name], NP  Status: Remains inpatient appropriate because:Persistent severe electrolyte disturbances, Unsafe d/c plan, and Inpatient level of care appropriate due to severity of illness  Dispo: The patient is from: Home              Anticipated d/c is to: SNF versus LTAC vs CIR              Patient currently is not medically stable to d/c.   Difficult to place patient Yes               Barriers to discharge: Currently has tracheostomy in place and is requiring dialysis but remains too weak to tolerate dialysis in chair which is a requirement for outpatient HD   Level of care: Progressive  Code Status: Full Family Communication: Attending physician spoke with wife at bedside 9/26-I spoke with wife by phone 10/13 DVT prophylaxis: FD Lovenox COVID vaccination status: Moderna 07/08/2019 and 08/11/2019 with first booster dose given 08/05/2020   HPI:  65 y.o. male with a history of T2DM with neuropathy, HTN, HLD., colon cancer s/p resection and colostomy May 2022 s/p 3 cycles chemotherapy (last was July 2022). Followed by Dr. Benay Spice. Started CAPOX June 2022, reduced cepecitabine dose starting 11/19/2020 due to hand/foot syndrome, reduced oxaliplatin due to renal impairment and poor performance status. Presented to the ED 7/24 with abdominal pain found to be febrile, tachycardic, tachypneic. Lactic acid was 6.6 and persisted at 6.8 after sepsis IV fluid bolus and cefepime. He had ARF, demand ischemia, bandemia, was hyperglycemic with negative ketones, with lactic acidosis. He developed AFib with RVR in the ED, was started on diltiazem, and was admitted to the ICU for septic shock with unclear source. Sepsis was subsequently felt to be ruled out at time of admission, presentation felt more likely to be related to cepacitabine toxicity with oral mucositis, hand/foot syndrome, and diarrhea/high output  colostomy.   He was transferred to hospitalist service 7/27 but developed metabolic encephalopathy with hyoxia due to aspiration pneumonia on 7/30 requiring intubation and pressor support. Failed extubation with inability to clear secretions, requiring reintubation 8/8, and tracheostomy subsequently placed 8/11. This was pulled out 8/22, replaced under bronch guidance, again removed 8/25 requiring reintubation after failed reinsertion. ENT revised tracheostomy in the OR 8/29. He has required IHD for ARF, and also had complications of RLE DVT now on eliquis, colitis thought to be ischemic 8/2 not requiring specific intervention, Pseudomonas pneumonia having completed treatment, agitated encephalopathy which has improved, anemia requiring 4u PRBCs total, and thrombocytopenia which has resolved. On 9/4 he was transferred to hospitalist service having tolerated trach collar.  Patient was also evaluated by Dr. Benay Spice during this admission clarified that patient was receiving curative chemotherapy prior to admission  On 10/7 patient had an episode of significant hypoxemia due to mucous plugging.  This required brief transfer to the ICU and brief mechanical ventilation until acute bronchoscopy could be completed.  Since that time patient has declined dialysis x2 prompting ENT consultation.  Nephrologist for size to the patient that dialysis is a life-sustaining procedure patient verbalized understanding.  As of 10/12 patient was back to baseline mentation.  He was reporting improved pain control and stating that he wanted to resume dialysis as scheduled for Thursday 10/13.  Subjective:  Remains lethargic.  Did briefly awaken and when I asked him if his pain was better he nodded yes.  Objective: Vitals:  02/20/21 0411 02/20/21 0739  BP:  126/80  Pulse:  88  Resp:  18  Temp:  98.1 F (36.7 C)  SpO2: 99% 93%    Intake/Output Summary (Last 24 hours) at 02/20/2021 0804 Last data filed at 02/19/2021  2000 Gross per 24 hour  Intake 1793 ml  Output 2152 ml  Net -359 ml   Filed Weights   02/16/21 0500 02/17/21 1610  Weight: 98.9 kg 104.2 kg    Exam: Constitutional: No acute distress, remains lethargic but appears comfortable Respiratory: #6 XLT cuffless trach, RA to TC, lung sounds remain clear to auscultation, no increased work of breathing, pulse oximetry 98% Cardiovascular: S1-S2, Normotensive.  Extremities warm to touch.  No JVD.  No peripheral edema. Abdomen: Abdomen soft, nontender, PEG site unremarkable, active bowel sounds.  Colostomy unremarkable in appearance Genitourinary: Foley in place with dark yellow urine noted Neurologic: CN 2-12 grossly intact.  Patient lethargic but awakens.  Strength remains significantly weak at 2-3/5 in both upper and lower extremities-lower extremity movement and strength affected by severe pedal neuropathy Psychiatric: Awakens, is oriented to name and place.   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  He is now agreeable to resuming hemodialysis.  Grade LA C reflux esophagitis/nonbleeding duodenal ulcers -Continue PPI for total of  4-weeks (initiated 9/29) -Follow-up EGD recommended after discharge  Uremic encephalopathy -BUN 105 and after dialysis on 10/13 down to 35  Inability to elevate tongue  -Neurology has documented issues not related to recent stroke or recurrent intubation -SLP also noted during MBS that with attempts at swallowing patient was unable to move muscles and neck and there was no visible movement of hyoid bone on fluoroscopy  Urinary retention -Did not tolerate discontinuation of Foley catheter so reinserted on 10/10 -We will need eventual follow-up with urology  Dysphagia/protein calorie malnutrition -PEG placed 10/4 -Continue tube feeding with nutritional supplements/protein supplements -10/12 begin free water 100 cc every 6 hours  MRI with bilateral punctate lesion ([place]) -Stroke team differential included thrombotic etiology from either A. fib or from DVT if has PFO-continue Eliquis -Recommendation is follow-up MRI in 6 to 8 weeks with and without contrast since not clear as to whether this could also be metastatic in etiology  Acute renal failure on stage IIIb CKD/Presumed ischemic ATN -started CRRT 7/31 > iHD 8/12 s/p left IJ [employer] 8/15.   -Newly oliguric over the past 24 hours with <100 cc out per Foley.  Nephrology does not believe he will regain renal function -Returned to dialysis on 10/13 and apparently tolerated well -Currently too debilitated to tolerate requirements for outpatient HD (transport to and from and dialysis in chair for 3.5 hours+)   Anemia of critical illness, acute blood loss as well as anemia of chronic disease and iron deficiency:  -  s/p 4u PRBCs this hospitalization and status post IV iron per nephrology -10/10 Recent hemoglobin down to 8.7 and increased to 9.5 after dialysis   Stage IIIb colon CA s/p partial transverse colectomy with end colostomy May 2022:  - Palliative care has had multiple conversations with patient and wife and currently want  to pursue aggressive care -Documented patient was on curative chemotherapy prior to admission  Physical deconditioning -Secondary to prolonged critical illness and 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 -Continue Neurontin-titrate up as needed-has severe pain in feet-onset prior to admission -continue scheduled supplemental Oxy IR   Uncontrolled T2DM on OHAs prior to admission with associated neuropathy:  -HbA1c 8.7% at admission. - Continue levemir and SSI  -Gabapentin resumed as above   HTN:  -BP controlled off of Norvasc   HLD:  -cont Atorvastatin   ?Sepsis - initially thought to be present on admission- has been ruled out.    Pressure Injury 02/05/21 Coccyx Lower Stage 2 -  Partial thickness loss of dermis presenting as a shallow open injury with a red, pink wound bed without slough. healing quarter sized spot on sacrum (Active)  Date First Assessed/Time First Assessed: 02/05/21 0800   Location: Coccyx  Location Orientation: Lower  Staging: Stage 2 -  Partial thickness loss of dermis presenting as a shallow open injury with a red, pink wound bed without slough.  Wound Descript...    Assessments 02/05/2021  5:10 PM 02/19/2021  8:07 PM  Dressing Type Foam - Lift dressing to assess site every shift Foam - Lift dressing to assess site every shift  Dressing Changed Clean;Dry;Intact  Dressing Change Frequency -- PRN  State of Healing Early/partial granulation --  Site / Wound Assessment Pink Clean;Dry  % Wound base Red or Granulating 100% --  Wound Length (cm) 2 cm --  Wound Width (cm) 2 cm --  Wound Surface Area (cm^2) 4 cm^2 --  Margins Attached  edges (approximated) --  Drainage Amount None None  Treatment Cleansed --     No Linked orders to display     Data Reviewed: Basic Metabolic Panel: Recent Labs  Lab 02/16/21 0206 02/17/21 0233 02/18/21 0648 02/19/21 0500 02/20/21 0351  NA 142 139 134* 131* 131*  K 4.0 4.7 3.8 3.7 3.7  CL 103 105 97* 94* 95*  CO2 25 20* 25 24 25   GLUCOSE 130* 176* 179* 159* 156*  BUN 89* 105* 46* 67* 35*  CREATININE 5.80* 5.72* 3.20* 3.93* 2.88*  CALCIUM 9.2 9.0 9.2 9.3 9.1  PHOS 5.0* 4.0 3.1 4.8* 2.4*   Liver Function Tests: Recent Labs  Lab 02/16/21 0206 02/17/21 0233 02/18/21 0648 02/19/21 0500 02/20/21 0351  ALBUMIN 2.1* 2.2* 2.2* 2.1* 2.3*    CBC: Recent Labs  Lab 02/14/21 0304 02/15/21 0353 02/16/21 0206 02/19/21 0500 02/20/21 0351  WBC 17.8* 16.7* 15.2* 11.8* 15.4*  NEUTROABS  --   --   --  8.0* 10.1*  HGB 7.1* 8.6* 8.7* 8.7* 9.5*  HCT 22.1* 25.2* 26.0* 25.7* 27.2*  MCV 86.7 85.4 85.0 82.9 81.0  PLT 272 285 293 348 391    CBG: Recent Labs  Lab 02/19/21 0814 02/19/21 1634 02/19/21  2006 02/19/21 2343 02/20/21 0342  GLUCAP 147* 201* 146* 149* 174*    Scheduled Meds:  apixaban  5 mg Oral BID   chlorhexidine  15 mL Mouth Rinse BID   Chlorhexidine Gluconate Cloth  6 each Topical Q0600   clonazePAM  1 mg Per Tube Q8H   diclofenac Sodium  4 g Topical QID   docusate  100 mg Per Tube BID   famotidine  20 mg Per Tube BID   feeding supplement (PROSource TF)  45 mL Per Tube BID   free water  100 mL Per Tube Q6H   gabapentin  100 mg Per Tube Q8H   guaiFENesin  10 mL Oral Q6H   hydrocerin   Topical BID   insulin aspart  0-15 Units Subcutaneous Q4H   insulin detemir  10 Units Subcutaneous Daily   levETIRAcetam  500 mg Per Tube BID   mouth rinse  15 mL Mouth Rinse q12n4p   oxyCODONE  2.5 mg Per Tube Q6H   pantoprazole sodium  40 mg Per Tube BID   polyethylene glycol  17 g Per Tube Daily   QUEtiapine  25 mg Oral BID   Continuous Infusions:  sodium chloride  500 mL (02/14/21 2209)   anticoagulant sodium citrate     feeding supplement (NEPRO CARB STEADY) 1,000 mL (02/18/21 0246)   promethazine (PHENERGAN) injection (IM or IVPB) 12.5 mg (02/05/21 0416)    Principal Problem:   Diabetes mellitus with hyperosmolarity without hyperglycemic hyperosmolar nonketotic coma (HCC) Active Problems:   Lactic acidosis   Acute renal failure superimposed on stage 2 chronic kidney disease (HCC)   Metabolic acidosis   Tachycardia   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 (HCC)   Adverse effect of chemotherapy   Pressure injury of skin   Atrial fibrillation with RVR (HCC)   Sepsis (HCC)   Malnutrition of moderate degree   Acute metabolic encephalopathy   Acute respiratory failure with hypoxemia (HCC)   Acute renal failure with acute cortical necrosis (HCC)   Shock (HCC)   Status post tracheostomy ([place])   Endotracheal tube present   Hemodialysis status ([place])   Palliative care by specialist   SOB (shortness of breath)   Non-intractable vomiting   Abdominal pain, epigastric   Cerebral embolism with cerebral infarction   Acute esophagitis   Duodenal ulcer disease   Consultants: PCCM General surgery Nephrology Hematology/oncology Palliative care medicine team  Procedures: 2u PRBC 7/31, 1u PRBC 8/17, 1u PRBC 8/27 Tracheostomy revision 01/05/2021 Dr. Constance Holster [facility] 8/22 10/7 briefly changed to cuffed trach to allow for short-term mechanical ventilation and subsequently changed back to cuffless trach on 10/8    Antibiotics: Vancomycin 7/24 Cefepime 7/24 - 7/27 Unasyn 7/30 >> cefepime/flagyl 7/31 - 8/6  Cefazolin x1 dose on 10/ Zosyn 10/5 through 10/9 Vancomycin 10/5 through 10/6    Time spent: 40 minutes    [name] ANP  [facility] 7 am - 330 pm/M-F for direct patient care and secure chat Please refer to Amion for contact info 81  days

## 2021-02-20 NOTE — Progress Notes (Signed)
Physical Therapy Treatment Patient Details Name: Thomas [name] MRN: [id number] DOB: 08-12-1955 Today's Date: 02/20/2021   History of Present Illness 65 y.o. male admitted 7/24 with high ostomy output. Pt with severe sepsis and multisystem organ failure. Hospital admission complicated by volume overload, aspiration pneumonia, acute respiratory failure, AKI and ischemic colitis. Intubated 7/30. S/p trach placement 8/11. CorTrak 8/12. HD initiated 8/12. To IR 8/15 for tunneled HD cath. Pulled out trach x2 8/22 replaced by PCCM. Pulled out trach again 8/25 but unable to be reinserted with pt intubated. 8/29 to OR for stoma clean up and trach replacement.Trach downsized 9/10. 10/5 respiratory distress with mucous plug requiring bronch, trach change and ventilator 10/5-10/6.  10/6 transitioned to trach collar, 10/7 pulled trach and inserted by RT with no complications. 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    PT Comments    Pt resting upon PT and OT arrival to room, wakes to name. PT and OT saw pt together to maximize functional mobility. Pt requiring total +2 assist for moving to/from EOB, but once EOB pt able to sit supervision for up to 1 minute. PT gesturing towards stomach painfully and grimacing in pain when moving LE's. PT to continue to progress pt mobility as able.    Recommendations for follow up therapy are one component of a multi-disciplinary discharge planning process, led by the attending physician.  Recommendations may be updated based on patient status, additional functional criteria and insurance authorization.  Follow Up Recommendations  SNF;Other (comment)     Equipment Recommendations  Wheelchair (measurements PT);Wheelchair cushion (measurements PT);Rolling walker  with 5" wheels;3in1 (PT)    Recommendations for Other Services       Precautions / Restrictions Precautions Precautions: Fall Precaution Comments: R subclavian tunneled HD cath, ostomy, peg, trach Restrictions Weight Bearing Restrictions: No     Mobility  Bed Mobility Overal bed mobility: Needs Assistance Bed Mobility: Supine to Sit;Sit to Supine     Supine to sit: Total assist;+2 for physical assistance Sit to supine: Total assist;+2 for physical assistance   General bed mobility comments: total +2 assist for helicopter method to/from EOB, pt initiating LE movement to EOB when cued.    Transfers                 General transfer comment: nt  Ambulation/Gait                 Stairs             Wheelchair Mobility    Modified Rankin (Stroke Patients Only)       Balance Overall balance assessment: Needs assistance Sitting-balance support: Feet supported;No upper extremity supported Sitting balance-Leahy Scale: Fair Sitting balance - Comments: initially pt requiring mod posterior truncal assist to maintain upright sitting, transitioning to supervision with pt tolerating x1 minute without support. EOB sitting x6 min performing ADL tasks                                    Cognition Arousal/Alertness: Lethargic (drowsy) Behavior During Therapy: Flat affect Overall Cognitive Status: Difficult to assess                     Current Attention Level: Sustained   Following Commands: Follows  one step commands with increased time Safety/Judgement: Decreased awareness of safety;Decreased awareness of deficits Awareness: Intellectual Problem Solving: Slow processing;Requires verbal cues;Requires tactile cues General Comments: pt drowsy, responds best and is most awake when addressed by name before giving command. follows one-step commands 75% of the time with increased time.      Exercises      General Comments         Pertinent Vitals/Pain Pain Assessment: Faces Faces Pain Scale: Hurts even more Pain Location: LEs, points to abdomen Pain Descriptors / Indicators: Grimacing;Guarding;Discomfort Pain Intervention(s): Limited activity within patient's tolerance;Monitored during session;Repositioned    Home Living                      Prior Function            PT Goals (current goals can now be found in the care plan section) Acute Rehab PT Goals Patient Stated Goal: none stated today PT Goal Formulation: With patient Time For Goal Achievement: 02/27/21 Potential to Achieve Goals: Fair Progress towards PT goals: Progressing toward goals    Frequency    Min 2X/week      PT Plan Current plan remains appropriate    Co-evaluation PT/OT/SLP Co-Evaluation/Treatment: Yes Reason for Co-Treatment: For patient/therapist safety;To address functional/ADL transfers;Complexity of the patient's impairments (multi-system involvement) PT goals addressed during session: Mobility/safety with mobility;Balance;Strengthening/ROM        AM-PAC PT "6 Clicks" Mobility   Outcome Measure  Help needed turning from your back to your side while in a flat bed without using bedrails?: Total Help needed moving from lying on your back to sitting on the side of a flat bed without using bedrails?: Total Help needed moving to and from a bed to a chair (including a wheelchair)?: Total Help needed standing up from a chair using your arms (e.g., wheelchair or bedside chair)?: Total Help needed to walk in hospital room?: Total Help needed climbing 3-5 steps with a railing? : Total 6 Click Score: 6    End of Session Equipment Utilized During Treatment: Oxygen (via trach) Activity Tolerance: Patient limited by fatigue Patient left: in bed;with call bell/phone within reach;with bed alarm set;Other (comment) (tube feed paused during session, resumed at end of session with HOB elevated to 32 deg) Nurse  Communication: Mobility status PT Visit Diagnosis: Muscle weakness (generalized) (M62.81);Other abnormalities of gait and mobility (R26.89)     Time: 3893-7342 PT Time Calculation (min) (ACUTE ONLY): 23 min  Charges:  $Therapeutic Activity: 8-22 mins                     Stacie Glaze, PT DPT Acute Rehabilitation Services Pager [phone]  Office [phone]    [name] 02/20/2021, 9:56 AM

## 2021-02-20 NOTE — Progress Notes (Signed)
This chaplain responded to PMT consult for spiritual care.  The Pt. wife-Thomas [name] is at the bedside.  The chaplain understands Thomas [name] is pleased the Pt. is resting today.    Thomas [name] is appreciative of the chaplain's presence and reflective listening. During the time together, Mahilia introduced the chaplain to the Pt. family and the Pt. will to live, "the Pt. grandchildren." Intertwined in the Pt. family is their faith. The chaplain understands Thomas [name] speaks for herself and the Pt. when she states  "God is in charge of the Pt. life."  The chaplain understands Thomas [name] is committed to being at the Pt. bedside and recognizes self care is important too. Thomas [name] and family identify as "prayer warriors" and are committed to remaining faithful in this hospital admission.  This chaplain is available for F/U spiritual care as needed.  Chaplain Sallyanne Kuster [phone]

## 2021-02-20 NOTE — Progress Notes (Signed)
Occupational Therapy Treatment Patient Details Name: Thomas [name] MRN: [id number] DOB: [birth year] Today's Date: 02/20/2021   History of present illness 65 y.o. male admitted 7/24 with high ostomy output. Pt with severe sepsis and multisystem organ failure. Hospital admission complicated by volume overload, aspiration pneumonia, acute respiratory failure, AKI and ischemic colitis. Intubated 7/30. S/p trach placement 8/11. CorTrak 8/12. HD initiated 8/12. To IR 8/15 for tunneled HD cath. Pulled out trach x2 8/22 replaced by PCCM. Pulled out trach again 8/25 but unable to be reinserted with pt intubated. 8/29 to OR for stoma clean up and trach replacement.Trach downsized 9/10. 10/5 respiratory distress with mucous plug requiring bronch, trach change and ventilator 10/5-10/6.  10/6 transitioned to trach collar, 10/7 pulled trach and inserted by RT with no complications. 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   OT comments  Pt supine in bed upon OT/PT arrival, wakes to name but requires frequent cueing to engage and maintain eyes open.  Noted preference to R head turn and gaze today, but able to scan towards L side to locate therapist when cued.  Pt requires +2 total assist for bed mobility to EOB, sitting EOB engaged in wiping mouth with max hand over hand assist and max assist to apply lotion to upper legs.  Pt presenting with initiation but unable to complete tasks. Able to give "high five" with R hand but not left.  Fatigues easily.  Will follow acutely.    Recommendations for follow up therapy are one component of a multi-disciplinary discharge planning process, led by the attending physician.  Recommendations may be updated based on patient status, additional functional criteria and  insurance authorization.    Follow Up Recommendations  SNF;Supervision/Assistance - 24 hour    Equipment Recommendations  Other (comment) (TBD)    Recommendations for Other Services      Precautions / Restrictions Precautions Precautions: Fall Precaution Comments: R subclavian tunneled HD cath, ostomy, peg, trach Restrictions Weight Bearing Restrictions: No       Mobility Bed Mobility Overal bed mobility: Needs Assistance Bed Mobility: Supine to Sit;Sit to Supine     Supine to sit: Total assist;+2 for physical assistance Sit to supine: Total assist;+2 for physical assistance   General bed mobility comments: total +2 assist for helicopter method to/from EOB, pt initiating LE movement to EOB when cued.    Transfers Overall transfer level: Needs assistance               General transfer comment: NT    Balance Overall balance assessment: Needs assistance Sitting-balance support: Feet supported;No upper extremity supported Sitting balance-Leahy Scale: Fair Sitting balance - Comments: initially pt requiring mod posterior truncal assist to maintain upright sitting, transitioning to supervision with pt tolerating x1 minute without support. EOB sitting x6 min performing ADL tasks                                   ADL either performed or assessed with clinical judgement   ADL Overall ADL's : Needs assistance/impaired Eating/Feeding: NPO   Grooming: Maximal assistance;Sitting Grooming Details (indicate cue type and reason): sitting EOB +2 assist, max hand over hand support to wipe mouth using R UE     Lower Body Bathing: Total assistance;+2 for physical assistance;Sitting/lateral  leans;Bed level Lower Body Bathing Details (indicate cue type and reason): simulated washing BLEs by applying lotion with limited initation and max mulitmodal cueing to complete task     Lower Body Dressing: Total assistance;Bed level Lower Body Dressing Details (indicate  cue type and reason): to don socks             Functional mobility during ADLs: Total assistance;+2 for safety/equipment;+2 for physical assistance General ADL Comments: limited to EOB     Vision   Additional Comments: cueing to keep eyes open and scan towards L side, preference to R sided gaze today   Perception     Praxis      Cognition Arousal/Alertness: Lethargic (drowsy) Behavior During Therapy: Flat affect Overall Cognitive Status: Difficult to assess                     Current Attention Level: Sustained   Following Commands: Follows one step commands with increased time Safety/Judgement: Decreased awareness of safety;Decreased awareness of deficits Awareness: Intellectual Problem Solving: Slow processing;Requires verbal cues;Requires tactile cues General Comments: pt drowsy, responds best and is most awake when addressed by name before giving command. follows one-step commands 75% of the time with increased time.        Exercises Exercises: Other exercises Other Exercises Other Exercises: supine BUE ROM- AAROM of B shoulder flexion and elbow flexion/extension   Shoulder Instructions       General Comments      Pertinent Vitals/ Pain       Pain Assessment: Faces Faces Pain Scale: Hurts even more Pain Location: LEs, points to abdomen Pain Descriptors / Indicators: Grimacing;Guarding;Discomfort Pain Intervention(s): Limited activity within patient's tolerance;Monitored during session;Repositioned  Home Living                                          Prior Functioning/Environment              Frequency  Min 2X/week        Progress Toward Goals  OT Goals(current goals can now be found in the care plan section)  Progress towards OT goals: Progressing toward goals (slowly)  Acute Rehab OT Goals Patient Stated Goal: none stated today OT Goal Formulation: Patient unable to participate in goal setting  Plan Discharge  plan remains appropriate;Frequency remains appropriate    Co-evaluation    PT/OT/SLP Co-Evaluation/Treatment: Yes Reason for Co-Treatment: Complexity of the patient's impairments (multi-system involvement);For patient/therapist safety;Necessary to address cognition/behavior during functional activity;To address functional/ADL transfers PT goals addressed during session: Mobility/safety with mobility;Balance;Strengthening/ROM OT goals addressed during session: ADL's and self-care      AM-PAC OT "6 Clicks" Daily Activity     Outcome Measure   Help from another person eating meals?: Total Help from another person taking care of personal grooming?: A Lot Help from another person toileting, which includes using toliet, bedpan, or urinal?: Total Help from another person bathing (including washing, rinsing, drying)?: Total Help from another person to put on and taking off regular upper body clothing?: Total Help from another person to put on and taking off regular lower body clothing?: Total 6 Click Score: 7    End of Session Equipment Utilized During Treatment: Oxygen (via trach collar 5L)  OT Visit Diagnosis: Unsteadiness on feet (R26.81);Muscle weakness (generalized) (M62.81);Pain Pain - Right/Left: Left Pain - part of body:  (stomach, LEs)   Activity  Tolerance Patient tolerated treatment well   Patient Left in bed;with call bell/phone within reach;with bed alarm set;with restraints reapplied   Nurse Communication Mobility status        Time: 9390-3009 OT Time Calculation (min): 23 min  Charges: OT General Charges $OT Visit: 1 Visit OT Treatments $Self Care/Home Management : 8-22 mins  Jolaine Artist, OT [place] Pager [phone] Office [phone]   Delight Stare 02/20/2021, 10:28 AM

## 2021-02-21 DIAGNOSIS — E1100 Type 2 diabetes mellitus with hyperosmolarity without nonketotic hyperglycemic-hyperosmolar coma (NKHHC): Secondary | ICD-10-CM | POA: Diagnosis not present

## 2021-02-21 LAB — GLUCOSE, CAPILLARY
Glucose-Capillary: 101 mg/dL — ABNORMAL HIGH (ref 70–99)
Glucose-Capillary: 122 mg/dL — ABNORMAL HIGH (ref 70–99)
Glucose-Capillary: 149 mg/dL — ABNORMAL HIGH (ref 70–99)
Glucose-Capillary: 164 mg/dL — ABNORMAL HIGH (ref 70–99)
Glucose-Capillary: 165 mg/dL — ABNORMAL HIGH (ref 70–99)
Glucose-Capillary: 205 mg/dL — ABNORMAL HIGH (ref 70–99)

## 2021-02-21 LAB — CBC WITH DIFFERENTIAL/PLATELET
Abs Immature Granulocytes: 0.62 10*3/uL — ABNORMAL HIGH (ref 0.00–0.07)
Basophils Absolute: 0.1 10*3/uL (ref 0.0–0.1)
Basophils Relative: 1 %
Eosinophils Absolute: 0.6 10*3/uL — ABNORMAL HIGH (ref 0.0–0.5)
Eosinophils Relative: 4 %
HCT: 27.2 % — ABNORMAL LOW (ref 39.0–52.0)
Hemoglobin: 9.3 g/dL — ABNORMAL LOW (ref 13.0–17.0)
Immature Granulocytes: 4 %
Lymphocytes Relative: 13 %
Lymphs Abs: 2 10*3/uL (ref 0.7–4.0)
MCH: 28 pg (ref 26.0–34.0)
MCHC: 34.2 g/dL (ref 30.0–36.0)
MCV: 81.9 fL (ref 80.0–100.0)
Monocytes Absolute: 1.1 10*3/uL — ABNORMAL HIGH (ref 0.1–1.0)
Monocytes Relative: 7 %
Neutro Abs: 11.4 10*3/uL — ABNORMAL HIGH (ref 1.7–7.7)
Neutrophils Relative %: 71 %
Platelets: 382 10*3/uL (ref 150–400)
RBC: 3.32 MIL/uL — ABNORMAL LOW (ref 4.22–5.81)
RDW: 14.1 % (ref 11.5–15.5)
WBC: 15.8 10*3/uL — ABNORMAL HIGH (ref 4.0–10.5)
nRBC: 0 % (ref 0.0–0.2)

## 2021-02-21 LAB — RENAL FUNCTION PANEL
Albumin: 2.3 g/dL — ABNORMAL LOW (ref 3.5–5.0)
Anion gap: 12 (ref 5–15)
BUN: 59 mg/dL — ABNORMAL HIGH (ref 8–23)
CO2: 26 mmol/L (ref 22–32)
Calcium: 9.6 mg/dL (ref 8.9–10.3)
Chloride: 92 mmol/L — ABNORMAL LOW (ref 98–111)
Creatinine, Ser: 3.82 mg/dL — ABNORMAL HIGH (ref 0.61–1.24)
GFR, Estimated: 17 mL/min — ABNORMAL LOW (ref >60)
Glucose, Bld: 169 mg/dL — ABNORMAL HIGH (ref 70–99)
Phosphorus: 6 mg/dL — ABNORMAL HIGH (ref 2.5–4.6)
Potassium: 3.5 mmol/L (ref 3.5–5.1)
Sodium: 130 mmol/L — ABNORMAL LOW (ref 135–145)

## 2021-02-21 MED ORDER — SODIUM CHLORIDE 0.9 % IV SOLN: 100.0000 mL | INTRAVENOUS | Status: DC | PRN

## 2021-02-21 MED ORDER — LIDOCAINE HCL (PF) 1 % IJ SOLN: 5.0000 mL | INTRAMUSCULAR | Status: DC | PRN

## 2021-02-21 MED ORDER — LIDOCAINE-PRILOCAINE 2.5-2.5 % EX CREA: 1.0000 "application " | TOPICAL_CREAM | CUTANEOUS | Status: DC | PRN

## 2021-02-21 MED ORDER — ALTEPLASE 2 MG IJ SOLR: 2.0000 mg | Freq: Once | INTRAMUSCULAR | Status: DC | PRN

## 2021-02-21 MED ORDER — HEPARIN SODIUM (PORCINE) 1000 UNIT/ML DIALYSIS: 1000.0000 [IU] | INTRAMUSCULAR | Status: DC | PRN

## 2021-02-21 MED ORDER — PENTAFLUOROPROP-TETRAFLUOROETH EX AERO: 1.0000 "application " | INHALATION_SPRAY | CUTANEOUS | Status: DC | PRN

## 2021-02-21 NOTE — Progress Notes (Signed)
Upon walking in the  room , pt was shouting and screaming, saying "ouch" prn fentanyl administered

## 2021-02-21 NOTE — Procedures (Signed)
Patient was seen on dialysis and the procedure was supervised.  BFR 400  Via TDC BP is  104/85.   Patient appears to be tolerating treatment well.  Discussed with HD nurse  Alaysiah Browder Tanna Furry 02/21/2021

## 2021-02-21 NOTE — Progress Notes (Signed)
Vitals documented at 1234  was taken while pt was agitated, pt is not a  true red muse

## 2021-02-21 NOTE — Progress Notes (Signed)
   02/21/21 1543  Assess: MEWS Score  Temp 98.2 F (36.8 C)  BP 137/80  Pulse Rate 98  Resp 19  Level of Consciousness Alert  SpO2 97 %  Assess: MEWS Score  MEWS Temp 0  MEWS Systolic 0  MEWS Pulse 0  MEWS RR 0  MEWS LOC 0  MEWS Score 0  MEWS Score Color Green  Assess: if the MEWS score is Yellow or Red  Were vital signs taken at a resting state? Yes  Focused Assessment No change from prior assessment  MEWS guidelines implemented *See Row Information* No, vital signs rechecked  Treat  MEWS Interventions  (RR and Pulse manually checked)  Assess: SIRS CRITERIA  SIRS Temperature  0  SIRS Pulse 1  SIRS Respirations  0  SIRS WBC 1  SIRS Score Sum  2

## 2021-02-21 NOTE — Progress Notes (Signed)
[facility] PROGRESS NOTE  Thomas MAROHL HKV:[id number] DOB: August 23, 1955 DOA: 11/30/2020 PCP: [name], NP  Status: Remains inpatient appropriate because:Persistent severe electrolyte disturbances, Unsafe d/c plan, and Inpatient level of care appropriate due to severity of illness  Dispo: The patient is from: Home              Anticipated d/c is to: SNF versus LTAC vs CIR              Patient currently is not medically stable to d/c.   Difficult to place patient Yes               Barriers to discharge: Currently has tracheostomy in place and is requiring dialysis but remains too weak to tolerate dialysis in chair which is a requirement for outpatient HD   Level of care: Progressive  Code Status: Full Family Communication: None today.  I discussed with the wife on 02/19/2021 at bedside. DVT prophylaxis: FD Lovenox COVID vaccination status: Moderna 07/08/2019 and 08/11/2019 with first booster dose given 08/05/2020   HPI:  65 y.o. male with a history of T2DM with neuropathy, HTN, HLD., colon cancer s/p resection and colostomy May 2022 s/p 3 cycles chemotherapy (last was July 2022). Followed by Dr. Benay Spice. Started CAPOX June 2022, reduced cepecitabine dose starting 11/19/2020 due to hand/foot syndrome, reduced oxaliplatin due to renal impairment and poor performance status. Presented to the ED 7/24 with abdominal pain found to be febrile, tachycardic, tachypneic. Lactic acid was 6.6 and persisted at 6.8 after sepsis IV fluid bolus and cefepime. He had ARF, demand ischemia, bandemia, was hyperglycemic with negative ketones, with lactic acidosis. He developed AFib with RVR in the ED, was started on diltiazem, and was admitted to the ICU for septic shock with unclear source. Sepsis was subsequently felt to be ruled out at time of admission, presentation felt more likely to be related to cepacitabine toxicity with oral mucositis, hand/foot syndrome, and diarrhea/high output colostomy.   He was  transferred to hospitalist service 7/27 but developed metabolic encephalopathy with hyoxia due to aspiration pneumonia on 7/30 requiring intubation and pressor support. Failed extubation with inability to clear secretions, requiring reintubation 8/8, and tracheostomy subsequently placed 8/11. This was pulled out 8/22, replaced under bronch guidance, again removed 8/25 requiring reintubation after failed reinsertion. ENT revised tracheostomy in the OR 8/29. He has required IHD for ARF, and also had complications of RLE DVT now on eliquis, colitis thought to be ischemic 8/2 not requiring specific intervention, Pseudomonas pneumonia having completed treatment, agitated encephalopathy which has improved, anemia requiring 4u PRBCs total, and thrombocytopenia which has resolved. On 9/4 he was transferred to hospitalist service having tolerated trach collar.  Patient was also evaluated by Dr. Benay Spice during this admission clarified that patient was receiving curative chemotherapy prior to admission  On 10/7 patient had an episode of significant hypoxemia due to mucous plugging.  This required brief transfer to the ICU and brief mechanical ventilation until acute bronchoscopy could be completed.  Since that time patient has declined dialysis x2 prompting ENT consultation.  Nephrologist for size to the patient that dialysis is a life-sustaining procedure patient verbalized understanding.  As of 10/12 patient was back to baseline mentation.  He was reporting improved pain control and stating that he wanted to resume dialysis as scheduled for Thursday 10/13.  Subjective:  Patient seen and examined in the dialysis unit.  He was alert and partly oriented.  Looks comfortable.  No complaints.  Was asking how  long it would take for his dialysis to finish.  Looks a little anxious.  Objective: Vitals:   02/21/21 1000 02/21/21 1030  BP: 113/78 120/86  Pulse: (!) 105 96  Resp:    Temp:    SpO2:      Intake/Output  Summary (Last 24 hours) at 02/21/2021 1130 Last data filed at 02/20/2021 1800 Gross per 24 hour  Intake 640 ml  Output 200 ml  Net 440 ml    Filed Weights   02/21/21 0811  Weight: 100 kg    Exam:  General exam: Appears calm and comfortable  Respiratory system: Clear to auscultation. Respiratory effort normal.  Trach in place. Cardiovascular system: S1 & S2 heard, RRR. No JVD, murmurs, rubs, gallops or clicks. No pedal edema. Gastrointestinal system: Abdomen is nondistended, soft and nontender. No organomegaly or masses felt. Normal bowel sounds heard. Central nervous system: Alert and oriented x2. No focal neurological deficits.  Globally decreased strength in all 4 extremities. Extremities: Symmetric 5 x 5 power. Skin: No rashes, lesions or ulcers.  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 -Continue supplemental scheduled Oxy IR  Goals of care -Repeat goals of care meeting in process with PMT-as of 10/12 patient affect much more improved noting improved pain control.  He is now agreeable to resuming hemodialysis.  Grade LA C reflux esophagitis/nonbleeding duodenal ulcers -Continue PPI for total of 4-weeks (initiated 9/29) -Follow-up EGD recommended after discharge  Uremic encephalopathy -BUN 105 and after dialysis on 10/13 down to 35  Inability to elevate tongue  -Neurology has documented issues not related to recent stroke or recurrent intubation -SLP also noted during MBS that with attempts at swallowing patient was unable to move muscles and neck and there was no visible movement of hyoid bone on fluoroscopy  Urinary  retention -Did not tolerate discontinuation of Foley catheter so reinserted on 10/10 -We will need eventual follow-up with urology  Dysphagia/protein calorie malnutrition -PEG placed 10/4 -Continue tube feeding with nutritional supplements/protein supplements -10/12 begin free water 100 cc every 6 hours  MRI with bilateral punctate lesion ([place]) -Stroke team differential included thrombotic etiology from either A. 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   -Newly oliguric over the past 24 hours with <100 cc out per Foley.  Nephrology does not believe he will regain renal function -Returned to dialysis on 10/13 and apparently tolerated well -Currently too debilitated to tolerate requirements for outpatient HD (transport to and from and dialysis in chair for 3.5 hours+)   Anemia of critical illness, acute blood loss as well as anemia of chronic disease and iron deficiency:  - s/p 4u PRBCs this hospitalization and status post IV iron per nephrology -10/10 Recent hemoglobin down to 8.7 and increased to 9.5 after dialysis   Stage IIIb colon CA s/p partial transverse colectomy with end colostomy May 2022:  - Palliative care has had multiple conversations with patient and wife and currently want to pursue aggressive care -  Documented patient was on curative chemotherapy prior to admission  Physical deconditioning -Secondary to prolonged critical illness and 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 -Continue Neurontin-titrate up as needed-has severe pain in feet-onset prior to admission -continue scheduled supplemental Oxy IR   Uncontrolled T2DM on OHAs prior to admission with associated neuropathy:  -HbA1c 8.7% at admission. - Continue levemir and SSI  -Gabapentin resumed as above   HTN:  -BP controlled off of Norvasc   HLD:  -cont Atorvastatin   ?Sepsis - initially thought to be present on admission- has been ruled out.    Pressure Injury 02/05/21 Coccyx Lower Stage 2 -  Partial thickness loss of dermis presenting as a shallow open injury with a red, pink wound bed without slough. healing quarter sized spot on sacrum (Active)  Date First Assessed/Time First Assessed: 02/05/21 0800   Location: Coccyx  Location Orientation: Lower  Staging: Stage 2 -  Partial thickness loss of dermis presenting as a shallow open injury with a red, pink wound bed without slough.  Wound Descript...    Assessments 02/05/2021  5:10 PM 02/20/2021  8:10 PM  Dressing Type Foam - Lift dressing to assess site every shift Foam - Lift dressing to assess site every shift  Dressing Changed Clean;Dry;Intact  Dressing Change Frequency -- Every 3 days  State of Healing Early/partial granulation --  Site / Wound Assessment Pink --  % Wound base Red or Granulating 100% --  Wound Length (cm) 2 cm --  Wound Width (cm) 2 cm --  Wound Surface Area (cm^2) 4 cm^2 --  Margins Attached edges (approximated) --  Drainage Amount None --  Treatment Cleansed --     No Linked orders to display     Data Reviewed: Basic Metabolic Panel: Recent Labs  Lab 02/17/21 0233 02/18/21 0648 02/19/21 0500 02/20/21 0351 02/21/21 0440  NA 139 134* 131* 131* 130*  K 4.7 3.8 3.7 3.7 3.5  CL 105 97* 94* 95* 92*  CO2 20* 25 24 25 26   GLUCOSE 176* 179* 159* 156* 169*  BUN 105* 46* 67* 35* 59*  CREATININE 5.72* 3.20* 3.93*  2.88* 3.82*  CALCIUM 9.0 9.2 9.3 9.1 9.6  PHOS 4.0 3.1 4.8* 2.4* 6.0*    Liver Function Tests: Recent Labs  Lab 02/17/21 0233 02/18/21 0648 02/19/21 0500 02/20/21 0351 02/21/21 0440  ALBUMIN 2.2* 2.2* 2.1* 2.3* 2.3*     CBC: Recent Labs  Lab 02/15/21 0353 02/16/21 0206 02/19/21 0500 02/20/21 0351 02/21/21 0440  WBC 16.7* 15.2* 11.8* 15.4* 15.8*  NEUTROABS  --   --  8.0* 10.1* 11.4*  HGB 8.6* 8.7* 8.7* 9.5* 9.3*  HCT 25.2* 26.0* 25.7* 27.2* 27.2*  MCV 85.4 85.0 82.9 81.0 81.9  PLT 285 293 348 391 382     CBG: Recent Labs  Lab 02/20/21 1122 02/20/21 1637 02/20/21 2034 02/21/21 0046 02/21/21 0443  GLUCAP 179* 133* 126* 149* 101*     Scheduled Meds:  apixaban  5 mg Oral BID   chlorhexidine  15 mL Mouth Rinse BID   Chlorhexidine Gluconate Cloth  6 each Topical Q0600   clonazePAM  1 mg Per Tube Q8H  diclofenac Sodium  4 g Topical QID   docusate  100 mg Per Tube BID   famotidine  20 mg Per Tube BID   feeding supplement (PROSource TF)  45 mL Per Tube BID   free water  100 mL Per Tube Q6H   gabapentin  100 mg Per Tube Q8H   guaiFENesin  10 mL Oral Q6H   hydrocerin   Topical BID   insulin aspart  0-15 Units Subcutaneous Q4H   insulin detemir  10 Units Subcutaneous Daily   levETIRAcetam  500 mg Per Tube BID   mouth rinse  15 mL Mouth Rinse q12n4p   oxyCODONE  2.5 mg Per Tube Q6H   pantoprazole sodium  40 mg Per Tube BID   polyethylene glycol  17 g Per Tube Daily   QUEtiapine  25 mg Oral BID   Continuous Infusions:  sodium chloride 500 mL (02/14/21 2209)   sodium chloride     sodium chloride     anticoagulant sodium citrate     feeding supplement (NEPRO CARB STEADY) 1,000 mL (02/18/21 0246)   promethazine (PHENERGAN) injection (IM or IVPB) 12.5 mg (02/05/21 0416)    Principal Problem:   Diabetes mellitus with hyperosmolarity without hyperglycemic hyperosmolar nonketotic coma ([facility]) Active Problems:   Lactic acidosis   Acute renal failure  superimposed on stage 2 chronic kidney disease (H[facility])   Metabolic acidosis   Tachycardia   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 ([place])   Adverse effect of chemotherapy   Pressure injury of skin   Atrial fibrillation with RVR ([place])   Sepsis ([place])   Malnutrition of moderate degree   Acute metabolic encephalopathy   Acute respiratory failure with hypoxemia (HCC)   Acute renal failure with acute cortical necrosis (HCC)   Shock ([place])   Status post tracheostomy ([place])   Endotracheal tube present   Hemodialysis status ([place])   Palliative care by specialist   SOB (shortness of breath)   Non-intractable vomiting   Abdominal pain, epigastric   Cerebral embolism with cerebral infarction   Acute esophagitis   Duodenal ulcer disease   Consultants: PCCM General surgery Nephrology Hematology/oncology Palliative care medicine team  Procedures: 2u PRBC 7/31, 1u PRBC 8/17, 1u PRBC 8/27 Tracheostomy revision 01/05/2021 Dr. Constance Holster [facility] 8/22 10/7 briefly changed to cuffed trach to allow for short-term mechanical ventilation and subsequently changed back to cuffless trach on 10/8    Antibiotics: Vancomycin 7/24 Cefepime 7/24 - 7/27 Unasyn 7/30 >> cefepime/flagyl 7/31 - 8/6  Cefazolin x1 dose on 10/ Zosyn 10/5 through 10/9 Vancomycin 10/5 through 10/6 Time spent: 30 minutes  Thomas [name] AMD  [facility]  82  days

## 2021-02-21 NOTE — Progress Notes (Addendum)
Pt vomited osmolite. Inspected trach and found no evidence of osmolite bc mucus appearance clear and thick. Charge came to bedside to witness and paused tube feed. Gave PRN medication for nausea/vomit and stomachache. Messaged Dr.Rathore MD via Shea Evans. She called back and gave verbal order to continue to hold tube feed till next shift during the day. Will continue to observe pt.

## 2021-02-21 NOTE — Plan of Care (Signed)

## 2021-02-22 ENCOUNTER — Inpatient Hospital Stay (HOSPITAL_COMMUNITY): Payer: Medicare HMO

## 2021-02-22 DIAGNOSIS — E1100 Type 2 diabetes mellitus with hyperosmolarity without nonketotic hyperglycemic-hyperosmolar coma (NKHHC): Secondary | ICD-10-CM | POA: Diagnosis not present

## 2021-02-22 LAB — CBC WITH DIFFERENTIAL/PLATELET
Abs Immature Granulocytes: 0.95 10*3/uL — ABNORMAL HIGH (ref 0.00–0.07)
Basophils Absolute: 0.2 10*3/uL — ABNORMAL HIGH (ref 0.0–0.1)
Basophils Relative: 1 %
Eosinophils Absolute: 0.1 10*3/uL (ref 0.0–0.5)
Eosinophils Relative: 1 %
HCT: 30.7 % — ABNORMAL LOW (ref 39.0–52.0)
Hemoglobin: 10.4 g/dL — ABNORMAL LOW (ref 13.0–17.0)
Immature Granulocytes: 4 %
Lymphocytes Relative: 11 %
Lymphs Abs: 2.8 10*3/uL (ref 0.7–4.0)
MCH: 27.7 pg (ref 26.0–34.0)
MCHC: 33.9 g/dL (ref 30.0–36.0)
MCV: 81.6 fL (ref 80.0–100.0)
Monocytes Absolute: 1.9 10*3/uL — ABNORMAL HIGH (ref 0.1–1.0)
Monocytes Relative: 8 %
Neutro Abs: 18.8 10*3/uL — ABNORMAL HIGH (ref 1.7–7.7)
Neutrophils Relative %: 75 %
Platelets: 415 10*3/uL — ABNORMAL HIGH (ref 150–400)
RBC: 3.76 MIL/uL — ABNORMAL LOW (ref 4.22–5.81)
RDW: 14.1 % (ref 11.5–15.5)
WBC: 24.7 10*3/uL — ABNORMAL HIGH (ref 4.0–10.5)
nRBC: 0 % (ref 0.0–0.2)

## 2021-02-22 LAB — GLUCOSE, CAPILLARY
Glucose-Capillary: 133 mg/dL — ABNORMAL HIGH (ref 70–99)
Glucose-Capillary: 154 mg/dL — ABNORMAL HIGH (ref 70–99)
Glucose-Capillary: 130 mg/dL — ABNORMAL HIGH (ref 70–99)
Glucose-Capillary: 142 mg/dL — ABNORMAL HIGH (ref 70–99)
Glucose-Capillary: 158 mg/dL — ABNORMAL HIGH (ref 70–99)
Glucose-Capillary: 149 mg/dL — ABNORMAL HIGH (ref 70–99)

## 2021-02-22 LAB — RENAL FUNCTION PANEL
Albumin: 2.6 g/dL — ABNORMAL LOW (ref 3.5–5.0)
Anion gap: 13 (ref 5–15)
BUN: 40 mg/dL — ABNORMAL HIGH (ref 8–23)
CO2: 23 mmol/L (ref 22–32)
Calcium: 9.2 mg/dL (ref 8.9–10.3)
Chloride: 94 mmol/L — ABNORMAL LOW (ref 98–111)
Creatinine, Ser: 3.13 mg/dL — ABNORMAL HIGH (ref 0.61–1.24)
GFR, Estimated: 21 mL/min — ABNORMAL LOW (ref >60)
Glucose, Bld: 135 mg/dL — ABNORMAL HIGH (ref 70–99)
Phosphorus: 3.5 mg/dL (ref 2.5–4.6)
Potassium: 4.4 mmol/L (ref 3.5–5.1)
Sodium: 130 mmol/L — ABNORMAL LOW (ref 135–145)

## 2021-02-22 NOTE — Progress Notes (Signed)
[facility] NEPHROLOGY PROGRESS NOTE  Assessment/ Plan:  # Sepsis/aspiration pneumonia with ventilator dependent respiratory failure: Status post tracheostomy, remains on tracheal collar with oxygen supplementation.  Per primary and pulmonary team.  #. Dialysis dependent acute kidney injury on chronic kidney disease stage IIIb: This is due to ischemic ATN and on dialysis dependent for more than 6-month therefore I think he is now ESRD.  Status post HD yesterday with 1.5 L ultrafiltration.  Plan for next HD on 10/18 as per TTS schedule.  I will discontinue daily lab orders.  He is not physically capable for outpatient dialysis at this time and will need significant OT/PT for physical debilitation arising from prolonged hospitalization/underlying illnesses before being able to tolerate outpatient dialysis.  # Anemia: Without overt blood loss and holding ESA in the setting of malignancy.  PRBC transfusion as needed for hemoglobin <8.  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  Calcium level acceptable.  Continue to monitor.    #  Stage IIIb colon cancer: Status post partial colectomy/colostomy and cycle 3 out of 4 of chemotherapy.  His last chemotherapy cycle was limited by physical status/deconditioning and toxicity from medications.  # Hypertension: Blood pressure intermittently low, not on antihypertensive therapy-we will evaluate closely for need to start predialysis midodrine.  Subjective: Seen and examined.  Tolerated dialysis well yesterday.  No new event.    Objective Vital signs in last 24 hours: Vitals:   02/21/21 2328 02/22/21 0030 02/22/21 0347 02/22/21 0400  BP:  91/61 91/61 114/72  Pulse: (!) 113 (!) 112 97 95  Resp: 20 20 15 15   Temp:  99.4 F (37.4 C)  99 F (37.2 C)  TempSrc:  Axillary  Axillary  SpO2: 100% 99% 100% 100%  Weight:      Height:       Weight change:   Intake/Output Summary (Last 24 hours) at 02/22/2021 0910 Last data  filed at 02/22/2021 0400 Gross per 24 hour  Intake --  Output 2450 ml  Net -2450 ml        Labs: Basic Metabolic Panel: Recent Labs  Lab 02/20/21 0351 02/21/21 0440 02/22/21 0334  NA 131* 130* 130*  K 3.7 3.5 4.4  CL 95* 92* 94*  CO2 25 26 23   GLUCOSE 156* 169* 135*  BUN 35* 59* 40*  CREATININE 2.88* 3.82* 3.13*  CALCIUM 9.1 9.6 9.2  PHOS 2.4* 6.0* 3.5    Liver Function Tests: Recent Labs  Lab 02/20/21 0351 02/21/21 0440 02/22/21 0334  ALBUMIN 2.3* 2.3* 2.6*    No results for input(s): LIPASE, AMYLASE in the last 168 hours. No results for input(s): AMMONIA in the last 168 hours. CBC: Recent Labs  Lab 02/16/21 0206 02/16/21 0206 02/19/21 0500 02/20/21 0351 02/21/21 0440 02/22/21 0334  WBC 15.2*  --  11.8* 15.4* 15.8* 24.7*  NEUTROABS  --    < > 8.0* 10.1* 11.4* 18.8*  HGB 8.7*  --  8.7* 9.5* 9.3* 10.4*  HCT 26.0*  --  25.7* 27.2* 27.2* 30.7*  MCV 85.0  --  82.9 81.0 81.9 81.6  PLT 293  --  348 391 382 415*   < > = values in this interval not displayed.    Cardiac Enzymes: No results for input(s): CKTOTAL, CKMB, CKMBINDEX, TROPONINI in the last 168 hours. CBG: Recent Labs  Lab 02/21/21 1724 02/21/21 1950 02/21/21 2354 02/22/21 0355 02/22/21 0804  GLUCAP 165* 164* 205* 133* 154*     Iron Studies: No results  for input(s): IRON, TIBC, TRANSFERRIN, FERRITIN in the last 72 hours. Studies/Results: DG Abd 1 View  Result Date: 02/22/2021 CLINICAL DATA:  Abdominal pain EXAM: ABDOMEN - 1 VIEW COMPARISON:  02/06/2021 FINDINGS: Weighted feeding catheter has been removed in the interval. Gastrostomy catheter is noted over the stomach. Scattered large and small bowel gas is seen. No obstructive changes are noted. IMPRESSION: Gastrostomy catheter within the stomach. No other focal abnormality is seen. Electronically Signed   By: Inez Catalina M.D.   On: 02/22/2021 00:30    Medications: Infusions:  sodium chloride 500 mL (02/14/21 2209)   anticoagulant  sodium citrate     feeding supplement (NEPRO CARB STEADY) Stopped (02/21/21 2235)   promethazine (PHENERGAN) injection (IM or IVPB) 12.5 mg (02/05/21 0416)    Scheduled Medications:  apixaban  5 mg Oral BID   chlorhexidine  15 mL Mouth Rinse BID   Chlorhexidine Gluconate Cloth  6 each Topical Q0600   clonazePAM  1 mg Per Tube Q8H   diclofenac Sodium  4 g Topical QID   docusate  100 mg Per Tube BID   famotidine  20 mg Per Tube BID   feeding supplement (PROSource TF)  45 mL Per Tube BID   free water  100 mL Per Tube Q6H   gabapentin  100 mg Per Tube Q8H   guaiFENesin  10 mL Oral Q6H   hydrocerin   Topical BID   insulin aspart  0-15 Units Subcutaneous Q4H   insulin detemir  10 Units Subcutaneous Daily   levETIRAcetam  500 mg Per Tube BID   mouth rinse  15 mL Mouth Rinse q12n4p   oxyCODONE  2.5 mg Per Tube Q6H   pantoprazole sodium  40 mg Per Tube BID   polyethylene glycol  17 g Per Tube Daily   QUEtiapine  25 mg Oral BID    have reviewed scheduled and prn medications.  Physical Exam: General:NAD, somnolent but able to open eyes.  Heart:RRR, s1s2 nl Lungs: Coarse breath sound bilaterally Abdomen:soft, feeding gastric tube and right lower quadrant colostomy present. Extremities:No edema Dialysis Access: Left IJ TDC in place  Tinsley Lomas Prasad Zira Helinski 02/22/2021,9:10 AM  LOS: 83 days

## 2021-02-22 NOTE — Progress Notes (Signed)
[facility] PROGRESS NOTE  Thomas [name] VQQ:[id number] DOB: 02-21-1956 DOA: 11/30/2020 PCP: [name], NP  Status: Remains inpatient appropriate because:Persistent severe electrolyte disturbances, Unsafe d/c plan, and Inpatient level of care appropriate due to severity of illness  Dispo: The patient is from: Home              Anticipated d/c is to: SNF versus LTAC vs CIR              Patient currently is not medically stable to d/c.   Difficult to place patient Yes               Barriers to discharge: Currently has tracheostomy in place and is requiring dialysis but remains too weak to tolerate dialysis in chair which is a requirement for outpatient HD   Level of care: Progressive  Code Status: Full Family Communication: None today.  I discussed with the wife on 02/19/2021 at bedside. DVT prophylaxis: FD Lovenox COVID vaccination status: Moderna 07/08/2019 and 08/11/2019 with first booster dose given 08/05/2020   HPI:  65 y.o. male with a history of T2DM with neuropathy, HTN, HLD., colon cancer s/p resection and colostomy May 2022 s/p 3 cycles chemotherapy (last was July 2022). Followed by Dr. Benay Spice. Started CAPOX June 2022, reduced cepecitabine dose starting 11/19/2020 due to hand/foot syndrome, reduced oxaliplatin due to renal impairment and poor performance status. Presented to the ED 7/24 with abdominal pain found to be febrile, tachycardic, tachypneic. Lactic acid was 6.6 and persisted at 6.8 after sepsis IV fluid bolus and cefepime. He had ARF, demand ischemia, bandemia, was hyperglycemic with negative ketones, with lactic acidosis. He developed AFib with RVR in the ED, was started on diltiazem, and was admitted to the ICU for septic shock with unclear source. Sepsis was subsequently felt to be ruled out at time of admission, presentation felt more likely to be related to cepacitabine toxicity with oral mucositis, hand/foot syndrome, and diarrhea/high output colostomy.   He was  transferred to hospitalist service 7/27 but developed metabolic encephalopathy with hyoxia due to aspiration pneumonia on 7/30 requiring intubation and pressor support. Failed extubation with inability to clear secretions, requiring reintubation 8/8, and tracheostomy subsequently placed 8/11. This was pulled out 8/22, replaced under bronch guidance, again removed 8/25 requiring reintubation after failed reinsertion. ENT revised tracheostomy in the OR 8/29. He has required IHD for ARF, and also had complications of RLE DVT now on eliquis, colitis thought to be ischemic 8/2 not requiring specific intervention, Pseudomonas pneumonia having completed treatment, agitated encephalopathy which has improved, anemia requiring 4u PRBCs total, and thrombocytopenia which has resolved. On 9/4 he was transferred to hospitalist service having tolerated trach collar.  Patient was also evaluated by Dr. Benay Spice during this admission clarified that patient was receiving curative chemotherapy prior to admission  On 10/7 patient had an episode of significant hypoxemia due to mucous plugging.  This required brief transfer to the ICU and brief mechanical ventilation until acute bronchoscopy could be completed.  Since that time patient has declined dialysis x2 prompting ENT consultation.  Nephrologist for size to the patient that dialysis is a life-sustaining procedure patient verbalized understanding.  As of 10/12 patient was back to baseline mentation.  He was reporting improved pain control and stating that he wanted to resume dialysis as scheduled for Thursday 10/13.  Subjective:  Patient seen and examined.  He is alert but not sure how oriented he is.  Speech slurred as always at his baseline.  Looks  comfortable and has no complaints.  Objective: Vitals:   02/22/21 0931 02/22/21 1200  BP:  119/69  Pulse:  90  Resp: 16 13  Temp:  98.1 F (36.7 C)  SpO2: 100% 99%    Intake/Output Summary (Last 24 hours) at 02/22/2021  1336 Last data filed at 02/22/2021 0400 Gross per 24 hour  Intake --  Output 950 ml  Net -950 ml    Filed Weights   02/21/21 0811  Weight: 100 kg    Exam: General exam: Appears calm and comfortable, trach in place Respiratory system: Clear to auscultation. Respiratory effort normal. Cardiovascular system: S1 & S2 heard, RRR. No JVD, murmurs, rubs, gallops or clicks. No pedal edema. Gastrointestinal system: Abdomen is nondistended, soft and nontender. No organomegaly or masses felt. Normal bowel sounds heard. Central nervous system: Alert but unsure of orientation.  Global weakness in all 4 extremities.   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 -Continue supplemental scheduled Oxy IR  Goals of care -Repeat goals of care meeting in process with PMT-as of 10/12 patient affect much more improved noting improved pain control.  He is now agreeable to resuming hemodialysis.  Grade LA C reflux esophagitis/nonbleeding duodenal ulcers -Continue PPI for total of 4-weeks (initiated 9/29) -Follow-up EGD recommended after discharge  Uremic encephalopathy -BUN 105 and after dialysis on 10/13 down to 35  Inability to elevate tongue  -Neurology has documented issues not related to recent stroke or recurrent intubation -SLP also noted during MBS that with attempts at swallowing patient was unable to move muscles and neck and there was no visible movement of hyoid bone on fluoroscopy  Urinary retention -Did not tolerate discontinuation of Foley catheter so reinserted on 10/10 -We will need eventual follow-up with urology  Dysphagia/protein calorie malnutrition -PEG placed 10/4 Tube feeds  were held this morning due to nausea and vomiting.  He was at 55 mL/h.  Now with no complaints.  Will resume at 30 cc/h and watch for 4 to 5 hours.  If no symptoms, will resume back at 55 mill per hour.  MRI with bilateral punctate lesion (Frontal watershed) -Stroke team differential included thrombotic etiology from either A. 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   -Newly oliguric over the past 24 hours with <100 cc out per Foley.  Nephrology does not believe he will regain renal function -Returned to dialysis on 10/13 and apparently tolerated well -Currently too debilitated to tolerate requirements for outpatient HD (transport to and from and dialysis in chair for 3.5 hours+)   Anemia of critical illness, acute blood loss as well as anemia of chronic disease and iron deficiency:  - s/p 4u PRBCs this hospitalization and status post IV iron per nephrology -10/10 Recent hemoglobin down to 8.7 and increased to 9.5 after dialysis   Stage IIIb colon CA s/p partial transverse colectomy with end colostomy May 2022:  - Palliative care has had multiple conversations with patient and wife and currently want to pursue aggressive care -Documented patient  was on curative chemotherapy prior to admission  Physical deconditioning -Secondary to prolonged critical illness and 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 -Continue Neurontin-titrate up as needed-has severe pain in feet-onset prior to admission -continue scheduled supplemental Oxy IR   Uncontrolled T2DM on OHAs prior to admission with associated neuropathy:  -HbA1c 8.7% at admission. - Continue levemir and SSI  -Gabapentin resumed as above   HTN:  -BP controlled off of Norvasc   HLD:  -cont Atorvastatin   ?Sepsis - initially thought to be present on admission- has been ruled out.    Pressure Injury 02/05/21 Coccyx Lower Stage 2 -  Partial thickness loss of dermis presenting as a shallow open injury with a red, pink wound bed without slough. healing quarter sized spot on sacrum (Active)  Date First Assessed/Time First Assessed: 02/05/21 0800   Location: Coccyx  Location Orientation: Lower  Staging: Stage 2 -  Partial thickness loss of dermis presenting as a shallow open injury with a red, pink wound bed without slough.  Wound Descript...    Assessments 02/05/2021  5:10 PM 02/20/2021  8:10 PM  Dressing Type Foam - Lift dressing to assess site every shift Foam - Lift dressing to assess site every shift  Dressing Changed Clean;Dry;Intact  Dressing Change Frequency -- Every 3 days  State of Healing Early/partial granulation --  Site / Wound Assessment Pink --  % Wound base Red or Granulating 100% --  Wound Length (cm) 2 cm --  Wound Width (cm) 2 cm --  Wound Surface Area (cm^2) 4 cm^2 --  Margins Attached edges (approximated) --  Drainage Amount None --  Treatment Cleansed --     No Linked orders to display     Data Reviewed: Basic Metabolic Panel: Recent Labs  Lab 02/18/21 0648 02/19/21 0500 02/20/21 0351 02/21/21 0440 02/22/21 0334  NA 134* 131* 131* 130* 130*  K 3.8 3.7 3.7 3.5 4.4  CL 97* 94* 95* 92* 94*  CO2 25 24 25 26 23   GLUCOSE 179* 159* 156* 169* 135*  BUN 46* 67* 35* 59* 40*  CREATININE 3.20* 3.93* 2.88* 3.82* 3.13*  CALCIUM 9.2 9.3 9.1 9.6 9.2  PHOS 3.1 4.8* 2.4* 6.0* 3.5    Liver Function  Tests: Recent Labs  Lab 02/18/21 0648 02/19/21 0500 02/20/21 0351 02/21/21 0440 02/22/21 0334  ALBUMIN 2.2* 2.1* 2.3* 2.3* 2.6*     CBC: Recent Labs  Lab 02/16/21 0206 02/19/21 0500 02/20/21 0351 02/21/21 0440 02/22/21 0334  WBC 15.2* 11.8* 15.4* 15.8* 24.7*  NEUTROABS  --  8.0* 10.1* 11.4* 18.8*  HGB 8.7* 8.7* 9.5* 9.3* 10.4*  HCT 26.0* 25.7* 27.2* 27.2* 30.7*  MCV 85.0 82.9 81.0 81.9 81.6  PLT 293 348 391 382 415*     CBG: Recent Labs  Lab 02/21/21 1950 02/21/21 2354 02/22/21 0355 02/22/21 0804 02/22/21 1234  GLUCAP 164* 205* 133* 154* 130*     Scheduled Meds:  apixaban  5 mg Oral BID   chlorhexidine  15 mL Mouth Rinse BID   Chlorhexidine Gluconate Cloth  6 each Topical Q0600   clonazePAM  1 mg Per Tube Q8H   diclofenac Sodium  4  g Topical QID   docusate  100 mg Per Tube BID   famotidine  20 mg Per Tube BID   feeding supplement (PROSource TF)  45 mL Per Tube BID   free water  100 mL Per Tube Q6H   gabapentin  100 mg Per Tube Q8H   guaiFENesin  10 mL Oral Q6H   hydrocerin   Topical BID   insulin aspart  0-15 Units Subcutaneous Q4H   insulin detemir  10 Units Subcutaneous Daily   levETIRAcetam  500 mg Per Tube BID   mouth rinse  15 mL Mouth Rinse q12n4p   oxyCODONE  2.5 mg Per Tube Q6H   pantoprazole sodium  40 mg Per Tube BID   polyethylene glycol  17 g Per Tube Daily   QUEtiapine  25 mg Oral BID   Continuous Infusions:  sodium chloride 500 mL (02/14/21 2209)   anticoagulant sodium citrate     feeding supplement (NEPRO CARB STEADY) 1,000 mL (02/22/21 1008)   promethazine (PHENERGAN) injection (IM or IVPB) 12.5 mg (02/05/21 0416)    Principal Problem:   Diabetes mellitus with hyperosmolarity without hyperglycemic hyperosmolar nonketotic coma ([place]) Active Problems:   Lactic acidosis   Acute renal failure superimposed on stage 2 chronic kidney disease (HCC)   Metabolic acidosis   Tachycardia   Hyperosmolar non-ketotic state due to type 2  diabetes mellitus ([place])   Adverse effect of chemotherapy   Pressure injury of skin   Atrial fibrillation with RVR ([place])   Sepsis ([place])   Malnutrition of moderate degree   Acute metabolic encephalopathy   Acute respiratory failure with hypoxemia (HCC)   Acute renal failure with acute cortical necrosis (HCC)   Shock ([place])   Status post tracheostomy ([place])   Endotracheal tube present   Hemodialysis status ([place])   Palliative care by specialist   SOB (shortness of breath)   Non-intractable vomiting   Abdominal pain, epigastric   Cerebral embolism with cerebral infarction   Acute esophagitis   Duodenal ulcer disease   Consultants: PCCM General surgery Nephrology Hematology/oncology Palliative care medicine team  Procedures: 2u PRBC 7/31, 1u PRBC 8/17, 1u PRBC 8/27 Tracheostomy revision 01/05/2021 Dr. Constance Holster [facility] 8/22 10/7 briefly changed to cuffed trach to allow for short-term mechanical ventilation and subsequently changed back to cuffless trach on 10/8    Antibiotics: Vancomycin 7/24 Cefepime 7/24 - 7/27 Unasyn 7/30 >> cefepime/flagyl 7/31 - 8/6  Cefazolin x1 dose on 10/ Zosyn 10/5 through 10/9 Vancomycin 10/5 through 10/6 Time spent: 30 minutes  Darliss Cheney AMD  [facility]  83  days

## 2021-02-22 NOTE — Plan of Care (Signed)

## 2021-02-22 NOTE — Progress Notes (Signed)
Pt to start back tube feed @30  mls per hour x4, then bump up to 55 mls per hour, if able to tolerate, per Dr. Doristine Bosworth

## 2021-02-23 ENCOUNTER — Inpatient Hospital Stay (HOSPITAL_COMMUNITY): Payer: Medicare HMO

## 2021-02-23 DIAGNOSIS — E1100 Type 2 diabetes mellitus with hyperosmolarity without nonketotic hyperglycemic-hyperosmolar coma (NKHHC): Secondary | ICD-10-CM | POA: Diagnosis not present

## 2021-02-23 LAB — CBC WITH DIFFERENTIAL/PLATELET
Abs Immature Granulocytes: 0.53 10*3/uL — ABNORMAL HIGH (ref 0.00–0.07)
Basophils Absolute: 0.1 10*3/uL (ref 0.0–0.1)
Basophils Relative: 1 %
Eosinophils Absolute: 0.7 10*3/uL — ABNORMAL HIGH (ref 0.0–0.5)
Eosinophils Relative: 3 %
HCT: 31.8 % — ABNORMAL LOW (ref 39.0–52.0)
Hemoglobin: 11.1 g/dL — ABNORMAL LOW (ref 13.0–17.0)
Immature Granulocytes: 3 %
Lymphocytes Relative: 15 %
Lymphs Abs: 3.1 10*3/uL (ref 0.7–4.0)
MCH: 28.1 pg (ref 26.0–34.0)
MCHC: 34.9 g/dL (ref 30.0–36.0)
MCV: 80.5 fL (ref 80.0–100.0)
Monocytes Absolute: 1.6 10*3/uL — ABNORMAL HIGH (ref 0.1–1.0)
Monocytes Relative: 7 %
Neutro Abs: 15.2 10*3/uL — ABNORMAL HIGH (ref 1.7–7.7)
Neutrophils Relative %: 71 %
Platelets: 391 10*3/uL (ref 150–400)
RBC: 3.95 MIL/uL — ABNORMAL LOW (ref 4.22–5.81)
RDW: 14 % (ref 11.5–15.5)
WBC: 21.3 10*3/uL — ABNORMAL HIGH (ref 4.0–10.5)
nRBC: 0.1 % (ref 0.0–0.2)

## 2021-02-23 LAB — GLUCOSE, CAPILLARY
Glucose-Capillary: 180 mg/dL — ABNORMAL HIGH (ref 70–99)
Glucose-Capillary: 126 mg/dL — ABNORMAL HIGH (ref 70–99)
Glucose-Capillary: 142 mg/dL — ABNORMAL HIGH (ref 70–99)
Glucose-Capillary: 128 mg/dL — ABNORMAL HIGH (ref 70–99)
Glucose-Capillary: 158 mg/dL — ABNORMAL HIGH (ref 70–99)

## 2021-02-23 LAB — C-REACTIVE PROTEIN: CRP: 8.5 mg/dL — ABNORMAL HIGH (ref <1.0)

## 2021-02-23 NOTE — Progress Notes (Signed)
[facility] NEPHROLOGY PROGRESS NOTE  Assessment/ Plan:  # Sepsis/aspiration pneumonia with ventilator dependent respiratory failure: Status post tracheostomy, remains on tracheal collar with oxygen supplementation.  Per primary and pulmonary team.  #. Dialysis dependent acute kidney injury on chronic kidney disease stage IIIb: This is due to ischemic ATN and on dialysis dependent for more than 4-month therefore he is now ESRD.  SPlan for next HD on TTS schedule.  He is not physically capable for outpatient dialysis at this time and will need significant OT/PT for physical debilitation arising from prolonged hospitalization/underlying illnesses before being able to tolerate outpatient dialysis.  2K, 4h, TDC, Tight Heparin. 2-3L UF SBP> 105  # Anemia: Without overt blood loss and holding ESA in the setting of malignancy.  PRBC transfusion as needed for hemoglobin <8.  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  Calcium level acceptable.  Continue to monitor.    #  Stage IIIb colon cancer: Status post partial colectomy/colostomy and cycle 3 out of 4 of chemotherapy.  His last chemotherapy cycle was limited by physical status/deconditioning and toxicity from medications.  # Hypertension: Blood pressure intermittently low, not on antihypertensive therapy-we will evaluate closely for need to start predialysis midodrine.  Subjective: Seen and examined.  No new events  Objective Vital signs in last 24 hours: Vitals:   02/23/21 0425 02/23/21 0718 02/23/21 0823 02/23/21 1122  BP:  127/77  127/78  Pulse: 91 88 89 92  Resp: 15 15 14 13   Temp:  98.4 F (36.9 C)  98.5 F (36.9 C)  TempSrc:  Axillary  Axillary  SpO2: 97% 100% 100% 100%  Weight:      Height:       Weight change:  No intake or output data in the 24 hours ending 02/23/21 1137      Labs: Basic Metabolic Panel: Recent Labs  Lab 02/20/21 0351 02/21/21 0440 02/22/21 0334  NA 131* 130*  130*  K 3.7 3.5 4.4  CL 95* 92* 94*  CO2 25 26 23   GLUCOSE 156* 169* 135*  BUN 35* 59* 40*  CREATININE 2.88* 3.82* 3.13*  CALCIUM 9.1 9.6 9.2  PHOS 2.4* 6.0* 3.5    Liver Function Tests: Recent Labs  Lab 02/20/21 0351 02/21/21 0440 02/22/21 0334  ALBUMIN 2.3* 2.3* 2.6*    No results for input(s): LIPASE, AMYLASE in the last 168 hours. No results for input(s): AMMONIA in the last 168 hours. CBC: Recent Labs  Lab 02/19/21 0500 02/20/21 0351 02/21/21 0440 02/22/21 0334 02/23/21 0811  WBC 11.8* 15.4* 15.8* 24.7* 21.3*  NEUTROABS 8.0* 10.1* 11.4* 18.8* 15.2*  HGB 8.7* 9.5* 9.3* 10.4* 11.1*  HCT 25.7* 27.2* 27.2* 30.7* 31.8*  MCV 82.9 81.0 81.9 81.6 80.5  PLT 348 391 382 415* 391    Cardiac Enzymes: No results for input(s): CKTOTAL, CKMB, CKMBINDEX, TROPONINI in the last 168 hours. CBG: Recent Labs  Lab 02/22/21 2057 02/22/21 2305 02/23/21 0404 02/23/21 0718 02/23/21 1120  GLUCAP 158* 149* 180* 126* 142*     Iron Studies: No results for input(s): IRON, TIBC, TRANSFERRIN, FERRITIN in the last 72 hours. Studies/Results: DG Abd 1 View  Result Date: 02/22/2021 CLINICAL DATA:  Abdominal pain EXAM: ABDOMEN - 1 VIEW COMPARISON:  02/06/2021 FINDINGS: Weighted feeding catheter has been removed in the interval. Gastrostomy catheter is noted over the stomach. Scattered large and small bowel gas is seen. No obstructive changes are noted. IMPRESSION: Gastrostomy catheter within the stomach. No other focal abnormality is  seen. Electronically Signed   By: Inez Catalina M.D.   On: 02/22/2021 00:30   DG CHEST PORT 1 VIEW  Result Date: 02/23/2021 CLINICAL DATA:  Leukocytosis, aspiration pneumonia EXAM: PORTABLE CHEST 1 VIEW COMPARISON:  Chest radiograph 02/14/2021 FINDINGS: The endotracheal tube tip is approximately 2.8 cm from the carina. The right chest wall port and tunneled left-sided central venous catheter are in stable position with the tips at the cavoatrial junction. The  cardiomediastinal silhouette is stable. Lung volumes are low. There is no focal consolidation or pulmonary edema. There is no pleural effusion or pneumothorax. There is no acute osseous abnormality. A gastrostomy tube is seen over the left upper quadrant. IMPRESSION: 1. Support devices as above in satisfactory position. 2. Low lung volumes. Otherwise, no radiographic evidence of acute cardiopulmonary process. Electronically Signed   By: Valetta Mole M.D.   On: 02/23/2021 10:48    Medications: Infusions:  sodium chloride 500 mL (02/14/21 2209)   anticoagulant sodium citrate     feeding supplement (NEPRO CARB STEADY) 1,000 mL (02/23/21 0450)   promethazine (PHENERGAN) injection (IM or IVPB) 12.5 mg (02/05/21 0416)    Scheduled Medications:  apixaban  5 mg Oral BID   chlorhexidine  15 mL Mouth Rinse BID   Chlorhexidine Gluconate Cloth  6 each Topical Q0600   clonazePAM  1 mg Per Tube Q8H   diclofenac Sodium  4 g Topical QID   docusate  100 mg Per Tube BID   famotidine  20 mg Per Tube BID   feeding supplement (PROSource TF)  45 mL Per Tube BID   free water  100 mL Per Tube Q6H   gabapentin  100 mg Per Tube Q8H   guaiFENesin  10 mL Oral Q6H   hydrocerin   Topical BID   insulin aspart  0-15 Units Subcutaneous Q4H   insulin detemir  10 Units Subcutaneous Daily   levETIRAcetam  500 mg Per Tube BID   mouth rinse  15 mL Mouth Rinse q12n4p   oxyCODONE  2.5 mg Per Tube Q6H   pantoprazole sodium  40 mg Per Tube BID   polyethylene glycol  17 g Per Tube Daily   QUEtiapine  25 mg Oral BID    have reviewed scheduled and prn medications.  Physical Exam: General:NAD, somnolent but able to open eyes.  Heart:RRR, s1s2 nl Lungs: Coarse breath sound bilaterally Abdomen:soft, feeding gastric tube and right lower quadrant colostomy present. Extremities:No edema Dialysis Access: Left IJ TDC in place  Casady Voshell B Linley Moxley 02/23/2021,11:37 AM  LOS: 84 days

## 2021-02-23 NOTE — Progress Notes (Signed)
Speech Language Pathology Treatment: Dysphagia  Patient Details Name: Thomas [name] MRN: [id number] DOB: 07/07/1955 Today's Date: 02/23/2021 Time: 4010-2725 SLP Time Calculation (min) (ACUTE ONLY): 12 min  Assessment / Plan / Recommendation Clinical Impression  Pt very lethargic again today, consistent with last several treatment sessions. Despite max tactile/verbal prompts, he just could not sustain wakefulness for meaningful participation. PMV placed. [name] opened his eyes intermittently to answer questions, allowed oral care, accepted several ice chips with encouragement, but required manual removal of ice and oral suctioning when he could not remain alert. PMV removed and HOB lowered so that he could rest. D/W RN.    Please continue vigilant oral care, place PMV when awake, and encourage ice chips.  SLP will continue efforts.     HPI HPI: Pt is a 65 y/o male admitted on 7/25 for worsening sepsis vs reaction to chemotherapy with high ostomy output.  Pt developed worsening hypoxia and was requiring BIPAP w/ FIO2 of 100% and increased work of breathing. SLP consulted due to concern for aspiration. BSE 7/28 recommended NPO status and MBS, but this could not completed subsequently due to lethargy. CXR in AM of 7/30 showed some worsening concern of aspiration. ETT 7/30-8/8; tolerated nasal cannula for about an hour; reintubated 8/8-trach 8/11. Pt pulled out trach x2 8/22; replaced by PCCM. Bronch on 8/22 revealed endotracheal granulation tissue at trach site. Trach removed again on 8/25; unable to reinsert despite multiple attenpts; pt reintubated 8/25- trach by ENT on 8/29. BSE 8/22 limited to ice chips due to pt's refusal of other consistencies; rx NPO status with Cortrak.MRI brain 9/30: Multiple punctate foci of restricted diffusion in bilateral  frontal white matter, most likely acute infarcts. EGD 9/30:grade C reflux esophagitis/non-bleeding duodenal ulcers.Pt s/p PEG 10/4.  Trach changed to  cuffed and pt transferred to ICU on 10/5 due to respiratory distress with mucous plug; bronch compled; vent 10/5-10/6. Bronch 10/5: Significant mucous plugging of the LUL with BAL and suctioning up until the subsegmental region. Trach replaced to #[id number] uncuffed. 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      SLP Plan  Continue with current plan of care      Recommendations for follow up therapy are one component of a multi-disciplinary discharge planning process, led by the attending physician.  Recommendations may be updated based on patient status, additional functional criteria and insurance authorization.    Recommendations  Diet recommendations: NPO (ice chips) Medication Administration: Via alternative means      Patient may use Passy-Muir Speech Valve: During all waking hours (remove during sleep) PMSV Supervision: Intermittent         Oral Care Recommendations: Oral care prior to ice chip/H20;Oral care QID Follow up Recommendations: Skilled Nursing facility SLP Visit Diagnosis: Dysphagia, oropharyngeal phase (R13.12) Plan: Continue with current plan of care                      Norissa Bartee L. Tivis Ringer, [place] CCC/SLP Acute Rehabilitation Services Office number [phone] Pager [phone]  Thomas [name] 02/23/2021, 5:05 PM

## 2021-02-23 NOTE — Progress Notes (Signed)
[facility] PROGRESS NOTE  Thomas [name] ZES:[id number] DOB: 1956/04/04 DOA: 11/30/2020 PCP: [name], NP  Status: Remains inpatient appropriate because:Persistent severe electrolyte disturbances, Unsafe d/c plan, and Inpatient level of care appropriate due to severity of illness  Dispo: The patient is from: Home              Anticipated d/c is to: SNF versus LTAC vs CIR              Patient currently is not medically stable to d/c.   Difficult to place patient Yes               Barriers to discharge: Currently has tracheostomy in place and is requiring dialysis but remains too weak to tolerate dialysis in chair which is a requirement for outpatient HD   Level of care: Progressive  Code Status: Full Family Communication: Attending physician spoke with wife at bedside 9/26-I spoke with wife by phone 10/13 DVT prophylaxis: FD Lovenox COVID vaccination status: Moderna 07/08/2019 and 08/11/2019 with first booster dose given 08/05/2020   HPI:  65 y.o. male with a history of T2DM with neuropathy, HTN, HLD., colon cancer s/p resection and colostomy May 2022 s/p 3 cycles chemotherapy (last was July 2022). Followed by Dr. Benay Spice. Started CAPOX June 2022, reduced cepecitabine dose starting 11/19/2020 due to hand/foot syndrome, reduced oxaliplatin due to renal impairment and poor performance status. Presented to the ED 7/24 with abdominal pain found to be febrile, tachycardic, tachypneic. Lactic acid was 6.6 and persisted at 6.8 after sepsis IV fluid bolus and cefepime. He had ARF, demand ischemia, bandemia, was hyperglycemic with negative ketones, with lactic acidosis. He developed AFib with RVR in the ED, was started on diltiazem, and was admitted to the ICU for septic shock with unclear source. Sepsis was subsequently felt to be ruled out at time of admission, presentation felt more likely to be related to cepacitabine toxicity with oral mucositis, hand/foot syndrome, and diarrhea/high output  colostomy.   He was transferred to hospitalist service 7/27 but developed metabolic encephalopathy with hyoxia due to aspiration pneumonia on 7/30 requiring intubation and pressor support. Failed extubation with inability to clear secretions, requiring reintubation 8/8, and tracheostomy subsequently placed 8/11. This was pulled out 8/22, replaced under bronch guidance, again removed 8/25 requiring reintubation after failed reinsertion. ENT revised tracheostomy in the OR 8/29. He has required IHD for ARF, and also had complications of RLE DVT now on eliquis, colitis thought to be ischemic 8/2 not requiring specific intervention, Pseudomonas pneumonia having completed treatment, agitated encephalopathy which has improved, anemia requiring 4u PRBCs total, and thrombocytopenia which has resolved. On 9/4 he was transferred to hospitalist service having tolerated trach collar.  Patient was also evaluated by Dr. Benay Spice during this admission clarified that patient was receiving curative chemotherapy prior to admission  On 10/7 patient had an episode of significant hypoxemia due to mucous plugging.  This required brief transfer to the ICU and brief mechanical ventilation until acute bronchoscopy could be completed.  He has had issues with difficult to manage neuropathy that was present prior to admission and caused by Xeloda.  Subjective:  Awakens but more lethargic than previous baseline.  Reports pain in feet better controlled  Objective: Vitals:   02/23/21 0425 02/23/21 0718  BP:  127/77  Pulse: 91 88  Resp: 15 15  Temp:  98.4 F (36.9 C)  SpO2: 97% 100%   No intake or output data in the 24 hours ending 02/23/21 0744  Filed Weights   02/21/21 0811  Weight: 100 kg    Exam: Constitutional: No acute distress, lethargic but appears comfortable Respiratory: #6 XLT cuffless trach, RA to TC, lung sounds remain clear coarse to auscultation, no increased work of breathing, pulse oximetry  98-100% Cardiovascular: S1-S2, Normotensive.  Extremities warm to touch.  No JVD.  No peripheral edema. Abdomen: Abdomen soft, nontender, PEG site unremarkable, active bowel sounds.  Colostomy collection bag with light brown liquid stool Genitourinary: Foley in place with yellow urine noted Neurologic: CN 2-12 grossly intact.  Patient lethargic but awakens.  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  If temperature increases or if WBC goes back up we will need to repeat blood cultures  Goals of care -Repeat goals of care addressed per primary medical team as well as PMT.  Wife and patient wish to continue aggressive treatment  Grade LA C reflux esophagitis/nonbleeding duodenal ulcers -Continue PPI for total of 4-weeks (initiated 9/29) -Follow-up EGD recommended after discharge  Uremic encephalopathy -BUN 105 and after dialysis on 10/13 down to 35  Inability to elevate tongue  -Neurology has documented issues not related to recent stroke or recurrent  intubation -SLP also noted during MBS that with attempts at swallowing patient was unable to move muscles and neck and there was no visible movement of hyoid bone on fluoroscopy  Urinary retention -Did not tolerate discontinuation of Foley catheter so reinserted on 10/10 -We will need eventual follow-up with urology  Dysphagia/protein calorie malnutrition -PEG placed 10/4; continue tube feedings, supplements and free water  MRI with bilateral punctate lesion ([place]) -Stroke team differential included thrombotic etiology from either A. fib or from DVT if has PFO-continue Eliquis -Recommendation is follow-up MRI in 6 to 8 weeks with and without contrast since not clear as to whether this could also be metastatic in etiology  Acute renal failure on stage IIIb CKD/Presumed ischemic ATN -started CRRT 7/31 > iHD 8/12 s/p left IJ [facility] 8/15.   -Nephrology does not believe he will regain renal function -Currently too debilitated to tolerate requirements for outpatient HD (transport to and from and dialysis in chair for 3.5 hours+)   Anemia of critical illness, acute blood loss as well as anemia of chronic disease and iron deficiency:  - s/p 4u PRBCs this hospitalization and status post IV iron per nephrology -Hemoglobin stable at 9.5    Stage IIIb colon CA s/p partial transverse colectomy with end colostomy May 2022:  - Palliative care has had multiple conversations with patient and wife and currently want to pursue aggressive care -Documented patient was on curative chemotherapy prior to admission -Xeloda on hold secondary to acute illness  Physical deconditioning -Secondary to prolonged critical illness and 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 - Continue levemir and SSI  -Gabapentin resumed as above   HTN:  -BP controlled off of Norvasc   HLD:  -cont Atorvastatin   ?Sepsis - initially thought to be present on admission- has been ruled out.    Pressure Injury 02/05/21 Coccyx Lower Stage 2 -  Partial thickness loss of dermis presenting as a shallow open injury with a red, pink wound bed without slough. healing quarter sized spot on sacrum (Active)  Date First Assessed/Time First Assessed: 02/05/21 0800   Location: Coccyx  Location Orientation: Lower  Staging: Stage 2 -  Partial thickness loss of dermis presenting as a shallow open injury with a red, pink wound bed without slough.  Wound Descript...    Assessments 02/05/2021  5:10 PM 02/20/2021  8:10 PM  Dressing Type Foam - Lift dressing to assess site every shift Foam - Lift dressing to assess site every shift  Dressing Changed Clean;Dry;Intact  Dressing Change Frequency -- Every 3 days  State of Healing Early/partial granulation --  Site / Wound Assessment Pink --  % Wound base Red or Granulating 100% --  Wound Length (cm) 2 cm --  Wound Width (cm) 2 cm --  Wound Surface Area (cm^2) 4 cm^2 --  Margins Attached edges (approximated) --  Drainage Amount None --  Treatment Cleansed --     No Linked orders to display     Data Reviewed: Basic Metabolic Panel: Recent Labs  Lab 02/18/21 0648 02/19/21 0500 02/20/21 0351 02/21/21 0440 02/22/21 0334  NA 134* 131* 131* 130* 130*  K 3.8 3.7 3.7 3.5 4.4  CL 97* 94* 95* 92* 94*  CO2 25 24 25 26 23   GLUCOSE 179* 159* 156* 169* 135*  BUN 46* 67* 35* 59* 40*  CREATININE 3.20* 3.93* 2.88* 3.82* 3.13*  CALCIUM 9.2 9.3 9.1 9.6 9.2  PHOS 3.1 4.8* 2.4* 6.0* 3.5   Liver Function Tests: Recent Labs  Lab 02/18/21 0648 02/19/21 0500 02/20/21 0351  02/21/21 0440 02/22/21 0334  ALBUMIN 2.2* 2.1* 2.3* 2.3* 2.6*    CBC: Recent Labs  Lab 02/19/21 0500 02/20/21 0351 02/21/21 0440 02/22/21 0334  WBC 11.8* 15.4* 15.8* 24.7*  NEUTROABS 8.0* 10.1* 11.4* 18.8*  HGB 8.7* 9.5* 9.3* 10.4*  HCT 25.7* 27.2* 27.2* 30.7*  MCV 82.9 81.0 81.9 81.6  PLT 348 391 382 415*    CBG: Recent Labs  Lab 02/22/21 1555 02/22/21 2057 02/22/21 2305 02/23/21 0404 02/23/21 0718  GLUCAP 142* 158* 149* 180* 126*    Scheduled Meds:  apixaban  5 mg Oral BID   chlorhexidine  15 mL Mouth Rinse BID   Chlorhexidine Gluconate Cloth  6 each Topical Q0600   clonazePAM  1 mg Per Tube Q8H   diclofenac Sodium  4 g Topical QID   docusate  100 mg Per Tube BID   famotidine  20 mg Per Tube BID   feeding supplement (PROSource TF)  45 mL Per Tube BID   free water  100 mL Per Tube Q6H   gabapentin  100 mg Per Tube Q8H   guaiFENesin  10 mL Oral Q6H   hydrocerin  Topical BID   insulin aspart  0-15 Units Subcutaneous Q4H   insulin detemir  10 Units Subcutaneous Daily   levETIRAcetam  500 mg Per Tube BID   mouth rinse  15 mL Mouth Rinse q12n4p   oxyCODONE  2.5 mg Per Tube Q6H   pantoprazole sodium  40 mg Per Tube BID   polyethylene glycol  17 g Per Tube Daily   QUEtiapine  25 mg Oral BID   Continuous Infusions:  sodium chloride 500 mL (02/14/21 2209)   anticoagulant sodium citrate     feeding supplement (NEPRO CARB STEADY) 1,000 mL (02/23/21 0450)   promethazine (PHENERGAN) injection (IM or IVPB) 12.5 mg (02/05/21 0416)    Principal Problem:   Diabetes mellitus with hyperosmolarity without hyperglycemic hyperosmolar nonketotic coma ([place]) Active Problems:   Lactic acidosis   Acute renal failure superimposed on stage 2 chronic kidney disease (HCC)   Metabolic acidosis   Tachycardia   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 ([place])   Adverse effect of chemotherapy   Pressure injury of skin   Atrial fibrillation with RVR ([place])    Sepsis ([place])   Malnutrition of moderate degree   Acute metabolic encephalopathy   Acute respiratory failure with hypoxemia (HCC)   Acute renal failure with acute cortical necrosis (HCC)   Shock ([place])   Status post tracheostomy ([place])   Endotracheal tube present   Hemodialysis status ([place])   Palliative care by specialist   SOB (shortness of breath)   Non-intractable vomiting   Abdominal pain, epigastric   Cerebral embolism with cerebral infarction   Acute esophagitis   Duodenal ulcer disease   Consultants: PCCM General surgery Nephrology Hematology/oncology Palliative care medicine team  Procedures: 2u PRBC 7/31, 1u PRBC 8/17, 1u PRBC 8/27 Tracheostomy revision 01/05/2021 Dr. Constance Holster [facility] 8/22 10/7 briefly changed to cuffed trach to allow for short-term mechanical ventilation and subsequently changed back to cuffless trach on 10/8    Antibiotics: Vancomycin 7/24 Cefepime 7/24 - 7/27 Unasyn 7/30 >> cefepime/flagyl 7/31 - 8/6  Cefazolin x1 dose on 10/ Zosyn 10/5 through 10/9 Vancomycin 10/5 through 10/6    Time spent: 40 minutes    [name] ANP  [facility] 7 am - 330 pm/M-F for direct patient care and secure chat Please refer to Amion for contact info 84  days

## 2021-02-23 NOTE — Plan of Care (Signed)
  Problem: Clinical Measurements: Goal: Will remain free from infection Outcome: Progressing Goal: Diagnostic test results will improve Outcome: Progressing Goal: Respiratory complications will improve Outcome: Progressing Goal: Cardiovascular complication will be avoided Outcome: Progressing   Problem: Activity: Goal: Risk for activity intolerance will decrease Outcome: Progressing   

## 2021-02-23 NOTE — TOC Progression Note (Signed)
Transition of Care [facility]) - Progression Note    Patient Details  Name: REINHARD SCHACK MRN: [id number] Date of Birth: 01-23-1956  Transition of [facility]) CM/SW [place], RN Phone Number: 02/23/2021, 12:13 PM  Clinical Narrative:    CM and MSW with DTP Team continue to follow the patient for SNF/LTAC options for admission.  No HD beds are availability at this time.    Expected Discharge Plan: [place] Barriers to Discharge: Continued Medical Work up  Expected Discharge Plan and Services Expected Discharge Plan: [place]   Discharge Planning Services: CM Consult Post Acute Care Choice: IP Rehab (IP Rehab versus SNF for rehab - has tracheostomy and HD care needs.) Living arrangements for the past 2 months: Single Family Home                                       Social Determinants of Health (SDOH) Interventions    Readmission Risk Interventions Readmission Risk Prevention Plan 02/16/2021  Transportation Screening Complete  Medication Review Press photographer) Complete  PCP or Specialist appointment within 3-5 days of discharge Complete  HRI or Home Care Consult Complete  SW Recovery Care/Counseling Consult Complete  Palliative Care Screening Complete  Skilled Nursing Facility Complete  Some recent data might be hidden

## 2021-02-24 ENCOUNTER — Inpatient Hospital Stay (HOSPITAL_COMMUNITY): Payer: Medicare HMO

## 2021-02-24 DIAGNOSIS — E1100 Type 2 diabetes mellitus with hyperosmolarity without nonketotic hyperglycemic-hyperosmolar coma (NKHHC): Secondary | ICD-10-CM | POA: Diagnosis not present

## 2021-02-24 LAB — RENAL FUNCTION PANEL
Albumin: 2.2 g/dL — ABNORMAL LOW (ref 3.5–5.0)
Anion gap: 14 (ref 5–15)
BUN: 84 mg/dL — ABNORMAL HIGH (ref 8–23)
CO2: 25 mmol/L (ref 22–32)
Calcium: 9.2 mg/dL (ref 8.9–10.3)
Chloride: 87 mmol/L — ABNORMAL LOW (ref 98–111)
Creatinine, Ser: 4.11 mg/dL — ABNORMAL HIGH (ref 0.61–1.24)
GFR, Estimated: 15 mL/min — ABNORMAL LOW (ref >60)
Glucose, Bld: 152 mg/dL — ABNORMAL HIGH (ref 70–99)
Phosphorus: 6.3 mg/dL — ABNORMAL HIGH (ref 2.5–4.6)
Potassium: 3.7 mmol/L (ref 3.5–5.1)
Sodium: 126 mmol/L — ABNORMAL LOW (ref 135–145)

## 2021-02-24 LAB — CBC
HCT: 26.3 % — ABNORMAL LOW (ref 39.0–52.0)
Hemoglobin: 9.1 g/dL — ABNORMAL LOW (ref 13.0–17.0)
MCH: 27.7 pg (ref 26.0–34.0)
MCHC: 34.6 g/dL (ref 30.0–36.0)
MCV: 79.9 fL — ABNORMAL LOW (ref 80.0–100.0)
Platelets: 363 10*3/uL (ref 150–400)
RBC: 3.29 MIL/uL — ABNORMAL LOW (ref 4.22–5.81)
RDW: 13.9 % (ref 11.5–15.5)
WBC: 16.6 10*3/uL — ABNORMAL HIGH (ref 4.0–10.5)
nRBC: 0 % (ref 0.0–0.2)

## 2021-02-24 LAB — GLUCOSE, CAPILLARY
Glucose-Capillary: 118 mg/dL — ABNORMAL HIGH (ref 70–99)
Glucose-Capillary: 132 mg/dL — ABNORMAL HIGH (ref 70–99)
Glucose-Capillary: 148 mg/dL — ABNORMAL HIGH (ref 70–99)
Glucose-Capillary: 149 mg/dL — ABNORMAL HIGH (ref 70–99)
Glucose-Capillary: 160 mg/dL — ABNORMAL HIGH (ref 70–99)
Glucose-Capillary: 168 mg/dL — ABNORMAL HIGH (ref 70–99)

## 2021-02-24 LAB — HEPATITIS B SURFACE ANTIGEN: Hepatitis B Surface Ag: NONREACTIVE

## 2021-02-24 MED ORDER — SODIUM CHLORIDE 0.9 % IV SOLN
3.0000 g | INTRAVENOUS | Status: DC
  Administered 2021-02-24: 3 g via INTRAVENOUS
  Filled 2021-02-24: qty 8

## 2021-02-24 MED ORDER — GUAIFENESIN 100 MG/5ML PO LIQD
10.0000 mL | Freq: Four times a day (QID) | ORAL | Status: DC
  Administered 2021-02-24 – 2021-02-25 (×3): 10 mL via ORAL
  Filled 2021-02-24 (×3): qty 10

## 2021-02-24 MED ORDER — HEPARIN SODIUM (PORCINE) 1000 UNIT/ML DIALYSIS
20.0000 [IU]/kg | INTRAMUSCULAR | Status: DC | PRN
  Administered 2021-02-24: 2000 [IU] via INTRAVENOUS_CENTRAL
  Filled 2021-02-24: qty 2

## 2021-02-24 MED ORDER — GABAPENTIN 250 MG/5ML PO SOLN
100.0000 mg | Freq: Two times a day (BID) | ORAL | Status: DC
  Administered 2021-02-24: 100 mg
  Filled 2021-02-24 (×2): qty 2

## 2021-02-24 NOTE — Procedures (Signed)
I was present at this dialysis session. I have reviewed the session itself and made appropriate changes.   4K bath. UF goal 2L.  TDC.  Pt tolerating well.   SNa 126, need to limit total fluids.  K 3.7.    Conversant, with PM valve. Says feels pretty good.    Filed Weights   02/21/21 0811 02/24/21 0500 02/24/21 1033  Weight: 100 kg 102.9 kg 102.8 kg    Recent Labs  Lab 02/24/21 0840  NA 126*  K 3.7  CL 87*  CO2 25  GLUCOSE 152*  BUN 84*  CREATININE 4.11*  CALCIUM 9.2  PHOS 6.3*    Recent Labs  Lab 02/21/21 0440 02/22/21 0334 02/23/21 0811 02/24/21 0840  WBC 15.8* 24.7* 21.3* 16.6*  NEUTROABS 11.4* 18.8* 15.2*  --   HGB 9.3* 10.4* 11.1* 9.1*  HCT 27.2* 30.7* 31.8* 26.3*  MCV 81.9 81.6 80.5 79.9*  PLT 382 415* 391 363    Scheduled Meds:  apixaban  5 mg Oral BID   chlorhexidine  15 mL Mouth Rinse BID   Chlorhexidine Gluconate Cloth  6 each Topical Q0600   clonazePAM  1 mg Per Tube Q8H   diclofenac Sodium  4 g Topical QID   docusate  100 mg Per Tube BID   famotidine  20 mg Per Tube BID   feeding supplement (PROSource TF)  45 mL Per Tube BID   free water  100 mL Per Tube Q6H   gabapentin  100 mg Per Tube Q12H   guaiFENesin  10 mL Oral Q6H   hydrocerin   Topical BID   insulin aspart  0-15 Units Subcutaneous Q4H   insulin detemir  10 Units Subcutaneous Daily   levETIRAcetam  500 mg Per Tube BID   mouth rinse  15 mL Mouth Rinse q12n4p   oxyCODONE  2.5 mg Per Tube Q6H   pantoprazole sodium  40 mg Per Tube BID   polyethylene glycol  17 g Per Tube Daily   QUEtiapine  25 mg Oral BID   Continuous Infusions:  sodium chloride 500 mL (02/14/21 2209)   anticoagulant sodium citrate     feeding supplement (NEPRO CARB STEADY) 1,000 mL (02/23/21 0450)   promethazine (PHENERGAN) injection (IM or IVPB) 12.5 mg (02/05/21 0416)   PRN Meds:.sodium chloride, acetaminophen (TYLENOL) oral liquid 160 mg/5 mL, albuterol, clonazePAM, dextrose, fentaNYL (SUBLIMAZE) injection,  glycopyrrolate, heparin, lip balm, metoprolol tartrate, ondansetron, promethazine (PHENERGAN) injection (IM or IVPB), promethazine, sodium chloride flush   Pearson Grippe  MD 02/24/2021, 1:04 PM

## 2021-02-24 NOTE — Progress Notes (Signed)
Patient with hypotension during treatment. UF was turned off and saline bolus given. Bp improved at end of treatment. Dr. Joelyn Oms notified.

## 2021-02-24 NOTE — Progress Notes (Signed)
PT Cancellation Note  Patient Details Name: Thomas [name] MRN: [id number] DOB: 1955/10/17   Cancelled Treatment:    Reason Eval/Treat Not Completed: Patient at procedure or test/unavailable. Pt off the floor at [facility]. PT to return as able to progress mobility.  Kittie Plater, PT, DPT Acute Rehabilitation Services Pager #: [phone] Office #: [phone]    Berline Lopes 02/24/2021, 11:08 AM

## 2021-02-24 NOTE — Progress Notes (Signed)
Patient vomited through trach... approx 54ml.  No desat but with continued coughing.  Respiratory at bedside and will change trach out.  Patient linen changed and approximately 131ml of tube feed removed from Stomach via tube. MD notified and CXR ordered.

## 2021-02-24 NOTE — Progress Notes (Signed)
Bp rechecked.  

## 2021-02-24 NOTE — Progress Notes (Signed)
Suctioned and sent to dialysis.

## 2021-02-24 NOTE — Progress Notes (Signed)
Pharmacy Antibiotic Note  KORIE BRABSON is a 65 y.o. male admitted on 11/30/2020 with complex medical history and extended hospital stay for AKI on CKD, sepsis and now concern for aspiration pneumonia.  Pharmacy has been consulted to dose Unasyn.  Plan: Unasyn 3g Q24H after HD, starting 10/18 PM  Height: 5\' 8"  (172.7 cm) Weight: 101.6 kg (223 lb 15.8 oz) IBW/kg (Calculated) : 68.4  Temp (24hrs), Avg:98.2 F (36.8 C), Min:97.1 F (36.2 C), Max:98.9 F (37.2 C)  Recent Labs  Lab 02/19/21 0500 02/20/21 0351 02/21/21 0440 02/22/21 0334 02/23/21 0811 02/24/21 0840  WBC 11.8* 15.4* 15.8* 24.7* 21.3* 16.6*  CREATININE 3.93* 2.88* 3.82* 3.13*  --  4.11*    Estimated Creatinine Clearance: 21 mL/min (A) (by C-G formula based on SCr of 4.11 mg/dL (H)).    Allergies  Allergen Reactions   Morphine And Related Other (See Comments)    Throat swelling    Antimicrobials this admission: Fluconazole 9/09 >> 9/15, 9/22>>9/27 Ceftazidime 9/08>>9/18 Nystatin oral 9/09>>9/22, 9/26 >>10/10 Cefepime 7/24 >> 7/28, 7/31 >> 8/07   Vanc x1 7/25, 8/01 >> 8/02,  10/5>> 10/07 Unasyn 7/30 >> 7/31; 9/19>>9/22 Flagyl 7/31 >> 8/07 Eraxis 8/01 >> 8/02 Cefazolin 8/15 >8/16 Ceftriaxone 8/16> 8/17; x 1 on 9/07 Cefepime 8/17>8/22 Zosyn 10/5>>10/09  Microbiology results: 7/31 Trach: candida tropicalis (likely not pathogenic) 8/15 TA: abundant GPCs, Pseudomonas 9/07 TA: moderate Pseudomonas 9/07 UCx: multiple species 9/19 UCx (cath): yeast 10/05 TA>>pseudomonas - likely colonizer at this point   Thank you for allowing pharmacy to be a part of this patient's care.  Ardyth Harps, PharmD Clinical Pharmacist

## 2021-02-24 NOTE — Progress Notes (Signed)
OT Cancellation Note  Patient Details Name: Thomas [name] MRN: [id number] DOB: Jan 30, 1956   Cancelled Treatment:    Reason Eval/Treat Not Completed: Patient at procedure or test/ unavailable- pt off unit at HD. Will follow and see as able.  Jolaine Artist, OT Acute Rehabilitation Services Pager [phone] Office [phone]   Delight Stare 02/24/2021, 11:08 AM

## 2021-02-24 NOTE — Progress Notes (Addendum)
[facility] PROGRESS NOTE  CELIA GIBBONS UJW:[id number] DOB: [birth year] DOA: 11/30/2020 PCP: [name], NP  Status: Remains inpatient appropriate because:Persistent severe electrolyte disturbances, Unsafe d/c plan, and Inpatient level of care appropriate due to severity of illness  Dispo: The patient is from: Home              Anticipated d/c is to: SNF versus LTAC vs CIR              Patient currently is not medically stable to d/c.   Difficult to place patient Yes               Barriers to discharge: Currently has tracheostomy in place and is requiring dialysis but remains too weak to tolerate dialysis in chair which is a requirement for outpatient HD   Level of care: Progressive  Code Status: Full Family Communication: Have confirmed wife's phone number with her previously.  10/18 sent text updating her on medication changes DVT prophylaxis: FD Lovenox COVID vaccination status: Moderna 07/08/2019 and 08/11/2019 with first booster dose given 08/05/2020   HPI:  65 y.o. male with a history of T2DM with neuropathy, HTN, HLD., colon cancer s/p resection and colostomy May 2022 s/p 3 cycles chemotherapy (last was July 2022). Followed by Dr. Benay Spice. Started CAPOX June 2022, reduced cepecitabine dose starting 11/19/2020 due to hand/foot syndrome, reduced oxaliplatin due to renal impairment and poor performance status. Presented to the ED 7/24 with abdominal pain found to be febrile, tachycardic, tachypneic. Lactic acid was 6.6 and persisted at 6.8 after sepsis IV fluid bolus and cefepime. He had ARF, demand ischemia, bandemia, was hyperglycemic with negative ketones, with lactic acidosis. He developed AFib with RVR in the ED, was started on diltiazem, and was admitted to the ICU for septic shock with unclear source. Sepsis was subsequently felt to be ruled out at time of admission, presentation felt more likely to be related to cepacitabine toxicity with oral mucositis, hand/foot syndrome, and  diarrhea/high output colostomy.   He was transferred to hospitalist service 7/27 but developed metabolic encephalopathy with hyoxia due to aspiration pneumonia on 7/30 requiring intubation and pressor support. Failed extubation with inability to clear secretions, requiring reintubation 8/8, and tracheostomy subsequently placed 8/11. This was pulled out 8/22, replaced under bronch guidance, again removed 8/25 requiring reintubation after failed reinsertion. ENT revised tracheostomy in the OR 8/29. He has required IHD for ARF, and also had complications of RLE DVT now on eliquis, colitis thought to be ischemic 8/2 not requiring specific intervention, Pseudomonas pneumonia having completed treatment, agitated encephalopathy which has improved, anemia requiring 4u PRBCs total, and thrombocytopenia which has resolved. On 9/4 he was transferred to hospitalist service having tolerated trach collar.  Patient was also evaluated by Dr. Benay Spice during this admission clarified that patient was receiving curative chemotherapy prior to admission  On 10/7 patient had an episode of significant hypoxemia due to mucous plugging.  This required brief transfer to the ICU and brief mechanical ventilation until acute bronchoscopy could be completed.  He has had issues with difficult to manage neuropathy that was present prior to admission and caused by Xeloda.  Subjective:  Remains lethargic but did wake up and stay awake somewhat longer than yesterday and was able to speak to me and answer orientation questions but then went back to sleep.  Objective: Vitals:   02/24/21 0400 02/24/21 0504  BP: 117/78 117/78  Pulse: 90 88  Resp: 16 14  Temp:  98.1 F (36.7 C)  SpO2: 96% 99%    Intake/Output Summary (Last 24 hours) at 02/24/2021 0740 Last data filed at 02/24/2021 0400 Gross per 24 hour  Intake 1228.42 ml  Output 800 ml  Net 428.42 ml    Filed Weights   02/21/21 0811 02/24/21 0500  Weight: 100 kg 102.9 kg     Exam: Constitutional: No acute distress, remains lethargic but easy to awaken Respiratory: #6 XLT cuffless trach, RA to TC, lung sounds remain clear coarse to auscultation, no increased work of breathing, pulse oximetry 98-100% Cardiovascular: S1-S2, Normotensive.  Extremities warm to touch.  No peripheral edema.  Regular maintaining sinus rhythm Abdomen: Abdomen soft, nontender, PEG site unremarkable, active bowel sounds.  Colostomy collection bag with light brown liquid stool to collection bag Genitourinary: Foley in place with yellow urine noted Neurologic: CN 2-12 grossly intact.  Patient lethargic but awakens.  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  Given emesis concerns over possible aspiration but follow-up chest x-ray unremarkable -WBCs continue to trend downward and he is not experiencing fevers -Blood cultures no growth to date  Goals of care -Repeat goals of care addressed per primary medical team as well as PMT.  Wife and patient wish to continue aggressive treatment  Grade LA C reflux esophagitis/nonbleeding duodenal ulcers -Continue PPI for total of 4-weeks (initiated  9/29) -Follow-up EGD recommended after discharge  Uremic encephalopathy -BUN 105 and after dialysis on 10/13 down to 35 -10/18 is a dialysis day and current BUN is 84  Inability to elevate tongue  -Neurology has documented issues not related to recent stroke or recurrent intubation -SLP also noted during MBS that with attempts at swallowing patient was unable to move muscles and neck and there was no visible movement of hyoid bone on fluoroscopy  Urinary retention -Did not tolerate discontinuation of Foley catheter so reinserted on 10/10 -We will need eventual follow-up with urology  Dysphagia/protein calorie malnutrition -PEG placed 10/4; continue tube feedings, supplements and free water  MRI with bilateral punctate lesion ([place]) -Stroke team differential included thrombotic etiology from either A. 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   -Nephrology does not believe he will regain renal function -Currently too debilitated to tolerate requirements for outpatient HD (transport to and from and dialysis in chair for 3.5 hours+)   Anemia of critical illness, acute blood loss as well as anemia of chronic disease and iron deficiency:  - s/p 4u PRBCs this hospitalization and status post IV iron per nephrology -Hemoglobin stable at 9.5    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:  - Palliative care has had multiple conversations with patient and wife and currently want to pursue aggressive care -Documented patient was on curative chemotherapy prior to admission -Xeloda on hold secondary to acute illness  Physical deconditioning -Secondary to prolonged critical illness and  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 - Continue levemir and SSI  -Gabapentin resumed as above   HTN:  -BP controlled off of Norvasc   HLD:  -cont Atorvastatin   ?Sepsis - initially thought to be present on admission- has been ruled out.    Pressure Injury 02/05/21 Coccyx Lower Stage 2 -  Partial thickness loss of dermis presenting as a shallow open injury with a red, pink wound bed without slough. healing quarter sized spot on sacrum (Active)  Date First Assessed/Time First Assessed: 02/05/21 0800   Location: Coccyx  Location Orientation: Lower  Staging: Stage 2 -  Partial thickness loss of dermis presenting as a shallow open injury with a red, pink wound bed without slough.  Wound Descript...    Assessments 02/05/2021  5:10 PM 02/20/2021  8:10 PM  Dressing Type Foam - Lift dressing to assess site every shift Foam - Lift dressing to assess site every shift  Dressing Changed Clean;Dry;Intact  Dressing Change Frequency -- Every 3 days  State of Healing Early/partial granulation --  Site / Wound Assessment Pink --  % Wound base Red or Granulating 100% --  Wound Length (cm) 2 cm --  Wound Width (cm) 2 cm --  Wound Surface Area (cm^2) 4 cm^2 --  Margins Attached edges (approximated) --  Drainage Amount None --  Treatment Cleansed --     No Linked orders to display     Data Reviewed: Basic Metabolic Panel: Recent Labs  Lab 02/18/21 0648 02/19/21 0500 02/20/21 0351 02/21/21 0440 02/22/21 0334  NA 134* 131* 131* 130* 130*  K 3.8 3.7 3.7 3.5 4.4  CL 97* 94* 95* 92* 94*   CO2 25 24 25 26 23   GLUCOSE 179* 159* 156* 169* 135*  BUN 46* 67* 35* 59* 40*  CREATININE 3.20* 3.93* 2.88* 3.82* 3.13*  CALCIUM 9.2 9.3 9.1 9.6 9.2  PHOS 3.1 4.8* 2.4* 6.0* 3.5   Liver Function Tests: Recent Labs  Lab 02/18/21 0648 02/19/21 0500 02/20/21 0351 02/21/21 0440 02/22/21 0334  ALBUMIN 2.2* 2.1* 2.3* 2.3* 2.6*    CBC: Recent Labs  Lab 02/19/21 0500 02/20/21 0351 02/21/21 0440 02/22/21 0334 02/23/21 0811  WBC 11.8* 15.4* 15.8* 24.7* 21.3*  NEUTROABS 8.0* 10.1* 11.4* 18.8* 15.2*  HGB 8.7* 9.5* 9.3* 10.4* 11.1*  HCT 25.7* 27.2* 27.2* 30.7* 31.8*  MCV 82.9 81.0 81.9 81.6 80.5  PLT 348 391 382 415* 391    CBG: Recent Labs  Lab 02/23/21 1120 02/23/21 1620 02/23/21 2055 02/24/21 0028 02/24/21 0359  GLUCAP 142* 128* 158* 168* 160*    Scheduled Meds:  apixaban  5 mg Oral BID   chlorhexidine  15 mL Mouth Rinse BID   Chlorhexidine Gluconate Cloth  6 each Topical Q0600   clonazePAM  1 mg Per Tube Q8H   diclofenac Sodium  4 g Topical QID   docusate  100 mg Per Tube BID   famotidine  20 mg Per Tube BID   feeding supplement (PROSource TF)  45 mL Per Tube BID   free water  100 mL Per Tube Q6H   gabapentin  100 mg Per Tube Q12H   guaiFENesin  10 mL Oral Q6H   hydrocerin   Topical BID   insulin aspart  0-15 Units Subcutaneous Q4H   insulin detemir  10 Units Subcutaneous Daily   levETIRAcetam  500 mg Per Tube BID   mouth rinse  15 mL Mouth Rinse q12n4p   oxyCODONE  2.5 mg Per Tube Q6H   pantoprazole sodium  40 mg Per Tube BID   polyethylene glycol  17 g Per Tube Daily   QUEtiapine  25 mg Oral BID   Continuous Infusions:  sodium chloride 500 mL (02/14/21 2209)   anticoagulant sodium citrate     feeding supplement (NEPRO CARB STEADY) 1,000 mL (02/23/21 0450)   promethazine (PHENERGAN) injection (IM or IVPB) 12.5 mg (02/05/21 0416)    Principal Problem:   Diabetes mellitus with hyperosmolarity without hyperglycemic hyperosmolar nonketotic coma  ([place]) Active Problems:   Lactic acidosis   Acute renal failure superimposed on stage 2 chronic kidney disease (HCC)   Metabolic acidosis   Tachycardia   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 ([place])   Adverse effect of chemotherapy   Pressure injury of skin   Atrial fibrillation with RVR ([place])   Sepsis ([place])   Malnutrition of moderate degree   Acute metabolic encephalopathy   Acute respiratory failure with hypoxemia (HCC)   Acute renal failure with acute cortical necrosis (HCC)   Shock ([place])   Status post tracheostomy ([place])   Endotracheal tube present   Hemodialysis status ([place])   Palliative care by specialist   SOB (shortness of breath)   Non-intractable vomiting   Abdominal pain, epigastric   Cerebral embolism with cerebral infarction   Acute esophagitis   Duodenal ulcer disease   Consultants: PCCM General surgery Nephrology Hematology/oncology Palliative care medicine team  Procedures: 2u PRBC 7/31, 1u PRBC 8/17, 1u PRBC 8/27 Tracheostomy revision 01/05/2021 Dr. Constance Holster [facility] 8/22 10/7 briefly changed to cuffed trach to allow for short-term mechanical ventilation and subsequently changed back to cuffless trach on 10/8    Antibiotics: Vancomycin 7/24 Cefepime 7/24 - 7/27 Unasyn 7/30 >> cefepime/flagyl 7/31 - 8/6  Cefazolin x1 dose on 10/ Zosyn 10/5 through 10/9 Vancomycin 10/5 through 10/6    Time spent: 40 minutes    [name] ANP  [facility] 7 am - 330 pm/M-F for direct patient care and secure chat Please refer to Amion for contact info 85  days

## 2021-02-25 ENCOUNTER — Inpatient Hospital Stay (HOSPITAL_COMMUNITY): Payer: Medicare HMO

## 2021-02-25 DIAGNOSIS — T451X5A Adverse effect of antineoplastic and immunosuppressive drugs, initial encounter: Secondary | ICD-10-CM | POA: Diagnosis not present

## 2021-02-25 DIAGNOSIS — J9600 Acute respiratory failure, unspecified whether with hypoxia or hypercapnia: Secondary | ICD-10-CM

## 2021-02-25 DIAGNOSIS — E1100 Type 2 diabetes mellitus with hyperosmolarity without nonketotic hyperglycemic-hyperosmolar coma (NKHHC): Secondary | ICD-10-CM | POA: Diagnosis not present

## 2021-02-25 DIAGNOSIS — D649 Anemia, unspecified: Secondary | ICD-10-CM

## 2021-02-25 LAB — SEDIMENTATION RATE: Sed Rate: >140 mm/hr — ABNORMAL HIGH (ref 0–16)

## 2021-02-25 LAB — BASIC METABOLIC PANEL
Anion gap: 16 — ABNORMAL HIGH (ref 5–15)
BUN: 36 mg/dL — ABNORMAL HIGH (ref 8–23)
CO2: 20 mmol/L — ABNORMAL LOW (ref 22–32)
Calcium: 8.8 mg/dL — ABNORMAL LOW (ref 8.9–10.3)
Chloride: 94 mmol/L — ABNORMAL LOW (ref 98–111)
Creatinine, Ser: 2.94 mg/dL — ABNORMAL HIGH (ref 0.61–1.24)
GFR, Estimated: 23 mL/min — ABNORMAL LOW (ref >60)
Glucose, Bld: 139 mg/dL — ABNORMAL HIGH (ref 70–99)
Potassium: 4.3 mmol/L (ref 3.5–5.1)
Sodium: 130 mmol/L — ABNORMAL LOW (ref 135–145)

## 2021-02-25 LAB — C-REACTIVE PROTEIN
CRP: 6.3 mg/dL — ABNORMAL HIGH (ref <1.0)
CRP: 12.8 mg/dL — ABNORMAL HIGH (ref <1.0)

## 2021-02-25 LAB — COMPREHENSIVE METABOLIC PANEL
ALT: 20 U/L (ref 0–44)
AST: 16 U/L (ref 15–41)
Albumin: 2.3 g/dL — ABNORMAL LOW (ref 3.5–5.0)
Alkaline Phosphatase: 92 U/L (ref 38–126)
Anion gap: 13 (ref 5–15)
BUN: 41 mg/dL — ABNORMAL HIGH (ref 8–23)
CO2: 23 mmol/L (ref 22–32)
Calcium: 8.5 mg/dL — ABNORMAL LOW (ref 8.9–10.3)
Chloride: 96 mmol/L — ABNORMAL LOW (ref 98–111)
Creatinine, Ser: 3.36 mg/dL — ABNORMAL HIGH (ref 0.61–1.24)
GFR, Estimated: 20 mL/min — ABNORMAL LOW (ref >60)
Glucose, Bld: 126 mg/dL — ABNORMAL HIGH (ref 70–99)
Potassium: 4 mmol/L (ref 3.5–5.1)
Sodium: 132 mmol/L — ABNORMAL LOW (ref 135–145)
Total Bilirubin: 1 mg/dL (ref 0.3–1.2)
Total Protein: 7.1 g/dL (ref 6.5–8.1)

## 2021-02-25 LAB — CBC
HCT: 18.4 % — ABNORMAL LOW (ref 39.0–52.0)
HCT: 28.7 % — ABNORMAL LOW (ref 39.0–52.0)
Hemoglobin: 6.4 g/dL — CL (ref 13.0–17.0)
Hemoglobin: 9.9 g/dL — ABNORMAL LOW (ref 13.0–17.0)
MCH: 28.1 pg (ref 26.0–34.0)
MCH: 28 pg (ref 26.0–34.0)
MCHC: 34.8 g/dL (ref 30.0–36.0)
MCHC: 34.5 g/dL (ref 30.0–36.0)
MCV: 80.7 fL (ref 80.0–100.0)
MCV: 81.1 fL (ref 80.0–100.0)
Platelets: 432 10*3/uL — ABNORMAL HIGH (ref 150–400)
Platelets: 314 10*3/uL (ref 150–400)
RBC: 2.28 MIL/uL — ABNORMAL LOW (ref 4.22–5.81)
RBC: 3.54 MIL/uL — ABNORMAL LOW (ref 4.22–5.81)
RDW: 14 % (ref 11.5–15.5)
RDW: 14 % (ref 11.5–15.5)
WBC: 28.1 10*3/uL — ABNORMAL HIGH (ref 4.0–10.5)
WBC: 20.2 10*3/uL — ABNORMAL HIGH (ref 4.0–10.5)
nRBC: 0 % (ref 0.0–0.2)
nRBC: 0 % (ref 0.0–0.2)

## 2021-02-25 LAB — URINALYSIS, COMPLETE (UACMP) WITH MICROSCOPIC
Glucose, UA: NEGATIVE mg/dL
Ketones, ur: NEGATIVE mg/dL
Nitrite: POSITIVE — AB
Protein, ur: 100 mg/dL — AB
Specific Gravity, Urine: 1.025 (ref 1.005–1.030)
WBC, UA: >50 WBC/hpf (ref 0–5)
pH: 5.5 (ref 5.0–8.0)

## 2021-02-25 LAB — GLUCOSE, CAPILLARY
Glucose-Capillary: 137 mg/dL — ABNORMAL HIGH (ref 70–99)
Glucose-Capillary: 104 mg/dL — ABNORMAL HIGH (ref 70–99)
Glucose-Capillary: 103 mg/dL — ABNORMAL HIGH (ref 70–99)
Glucose-Capillary: 105 mg/dL — ABNORMAL HIGH (ref 70–99)
Glucose-Capillary: 99 mg/dL (ref 70–99)
Glucose-Capillary: 93 mg/dL (ref 70–99)

## 2021-02-25 LAB — PROCALCITONIN: Procalcitonin: 0.95 ng/mL

## 2021-02-25 LAB — PREPARE RBC (CROSSMATCH)

## 2021-02-25 LAB — LACTIC ACID, PLASMA
Lactic Acid, Venous: 3.1 mmol/L (ref 0.5–1.9)
Lactic Acid, Venous: 2 mmol/L (ref 0.5–1.9)
Lactic Acid, Venous: 1.3 mmol/L (ref 0.5–1.9)

## 2021-02-25 MED ORDER — VANCOMYCIN HCL 2000 MG/400ML IV SOLN
2000.0000 mg | Freq: Once | INTRAVENOUS | Status: AC
  Administered 2021-02-25: 2000 mg via INTRAVENOUS
  Filled 2021-02-25: qty 400

## 2021-02-25 MED ORDER — LEVETIRACETAM IN NACL 500 MG/100ML IV SOLN
500.0000 mg | Freq: Two times a day (BID) | INTRAVENOUS | Status: DC
  Administered 2021-02-25 – 2021-02-26 (×3): 500 mg via INTRAVENOUS
  Filled 2021-02-25 (×3): qty 100

## 2021-02-25 MED ORDER — VANCOMYCIN HCL IN DEXTROSE 1-5 GM/200ML-% IV SOLN
1000.0000 mg | INTRAVENOUS | Status: DC
  Administered 2021-02-28: 1000 mg via INTRAVENOUS
  Filled 2021-02-25 (×3): qty 200

## 2021-02-25 MED ORDER — SODIUM CHLORIDE 0.9% IV SOLUTION: Freq: Once | INTRAVENOUS | Status: AC

## 2021-02-25 MED ORDER — MIDODRINE HCL 5 MG PO TABS
10.0000 mg | ORAL_TABLET | ORAL | Status: DC | PRN
  Administered 2021-02-26: 10 mg via ORAL
  Filled 2021-02-25: qty 2

## 2021-02-25 MED ORDER — OXYCODONE HCL 5 MG/5ML PO SOLN: 2.5000 mg | Freq: Four times a day (QID) | ORAL | Status: DC | PRN

## 2021-02-25 MED ORDER — SODIUM CHLORIDE 0.9% IV SOLUTION
Freq: Once | INTRAVENOUS | Status: AC
  Administered 2021-02-25: 1 mL via INTRAVENOUS

## 2021-02-25 MED ORDER — SODIUM CHLORIDE 0.9 % IV BOLUS
1000.0000 mL | Freq: Once | INTRAVENOUS | Status: DC
Start: 2021-02-25 — End: 2021-02-25

## 2021-02-25 MED ORDER — SODIUM CHLORIDE 0.9 % IV BOLUS
250.0000 mL | Freq: Once | INTRAVENOUS | Status: AC
  Administered 2021-02-25: 250 mL via INTRAVENOUS

## 2021-02-25 MED ORDER — PIPERACILLIN-TAZOBACTAM IN DEX 2-0.25 GM/50ML IV SOLN
2.2500 g | Freq: Three times a day (TID) | INTRAVENOUS | Status: AC
  Administered 2021-02-25 – 2021-03-04 (×23): 2.25 g via INTRAVENOUS
  Filled 2021-02-25 (×25): qty 50

## 2021-02-25 MED ORDER — APIXABAN 5 MG PO TABS: 5.0000 mg | ORAL_TABLET | Freq: Two times a day (BID) | ORAL | Status: DC

## 2021-02-25 MED ORDER — LORAZEPAM 2 MG/ML IJ SOLN
0.5000 mg | INTRAMUSCULAR | Status: DC | PRN
  Administered 2021-02-26 – 2021-03-02 (×8): 0.5 mg via INTRAVENOUS
  Filled 2021-02-25 (×8): qty 1

## 2021-02-25 MED ORDER — FAMOTIDINE IN NACL 20-0.9 MG/50ML-% IV SOLN
20.0000 mg | Freq: Two times a day (BID) | INTRAVENOUS | Status: DC
  Administered 2021-02-25 (×2): 20 mg via INTRAVENOUS
  Filled 2021-02-25 (×3): qty 50

## 2021-02-25 MED ORDER — QUETIAPINE FUMARATE 25 MG PO TABS: 25.0000 mg | ORAL_TABLET | Freq: Two times a day (BID) | ORAL | Status: DC

## 2021-02-25 MED ORDER — ACETAMINOPHEN 650 MG RE SUPP: 650.0000 mg | RECTAL | Status: DC | PRN

## 2021-02-25 MED ORDER — CLONAZEPAM 0.5 MG PO TABS: 1.0000 mg | ORAL_TABLET | Freq: Three times a day (TID) | ORAL | Status: DC | PRN

## 2021-02-25 MED ORDER — PANTOPRAZOLE SODIUM 40 MG IV SOLR
40.0000 mg | Freq: Two times a day (BID) | INTRAVENOUS | Status: DC
  Administered 2021-02-25 – 2021-02-26 (×3): 40 mg via INTRAVENOUS
  Filled 2021-02-25 (×3): qty 40

## 2021-02-25 NOTE — Progress Notes (Signed)
Please see PN from [name] NP for details.  Patient seen and examined. He is lethargic, open eyes to voice. Fall back sleep. Abdomen is soft, peg tube in place.  Patient vomited last night, possible aspiration, develops fever and hypotension.  Started on broad spectrum antibiotics.  Received one unit PRBC>  Hold eliquis due to significant drop in hb.  CT abdomen negative for retroperitoneal bleed.  CCM consulted, limitation with IV fluids due to HD patient.  UA with more tha 50 WBC>  Niel Hummer, MD.

## 2021-02-25 NOTE — Progress Notes (Signed)
Pharmacy Antibiotic Note  Thomas [name] is a 65 y.o. male admitted on 11/30/2020 with complex medical history and extended hospital stay for AKI on CKD, sepsis and now concern for aspiration pneumonia.  Pharmacy originally consulted for Unasyn - MD expanding coverage to Zosyn. Pt is on HD now.  Plan: Zosyn 2.25gm IV q8h Will f/u micro data, and pt's clinical condition  Height: 5\' 8"  (172.7 cm) Weight: 101.6 kg (223 lb 15.8 oz) IBW/kg (Calculated) : 68.4  Temp (24hrs), Avg:98.9 F (37.2 C), Min:97.1 F (36.2 C), Max:101.6 F (38.7 C)  Recent Labs  Lab 02/19/21 0500 02/20/21 0351 02/21/21 0440 02/22/21 0334 02/23/21 0811 02/24/21 0840  WBC 11.8* 15.4* 15.8* 24.7* 21.3* 16.6*  CREATININE 3.93* 2.88* 3.82* 3.13*  --  4.11*     Estimated Creatinine Clearance: 21 mL/min (A) (by C-G formula based on SCr of 4.11 mg/dL (H)).    Allergies  Allergen Reactions   Morphine And Related Other (See Comments)    Throat swelling    Antimicrobials this admission: Fluconazole 9/09 >> 9/15, 9/22>>9/27 Ceftazidime 9/08>>9/18 Nystatin oral 9/09>>9/22, 9/26 >>10/10 Cefepime 7/24 >> 7/28, 7/31 >> 8/07   Vanc x1 7/25, 8/01 >> 8/02,  10/5>> 10/07 Unasyn 7/30 >> 7/31; 9/19>>9/22; restart 10/18>>10/19 Flagyl 7/31 >> 8/07 Eraxis 8/01 >> 8/02 Cefazolin 8/15 >8/16 Ceftriaxone 8/16> 8/17; x 1 on 9/07 Cefepime 8/17>8/22 Zosyn 10/5>>10/09; restart 10/19>>  Microbiology results: 7/31 Trach: candida tropicalis (likely not pathogenic) 8/15 TA: abundant GPCs, Pseudomonas 9/07 TA: moderate Pseudomonas 9/07 UCx: multiple species 9/19 UCx (cath): yeast 10/05 TA>>pseudomonas - likely colonizer at this point 10/19 BCx:    Thank you for allowing pharmacy to be a part of this patient's care.  Sherlon Handing, PharmD, BCPS Please see amion for complete clinical pharmacist phone list 02/25/2021 12:56 AM

## 2021-02-25 NOTE — Progress Notes (Signed)
Assumed care of patient at 1900.  Patient actively vomiting at start of shift, with day shift nurse, family, and Respiratory therapist present at bedside. Informed by family that patient often has severe nausea post-dialysis.  IV phenergan given and patient suctioned multiple times with RT replacing inner cannula once vomiting subsided.  Coarse lung sounds and persistent cough noted after episode subsided.   Portable CXR done and showed possible RUL pneumonia.  BP trended hypotensive overnight and patient febrile, with concern for developing sepsis.  Multiple new orders overnight, including lactic acid (critical at 3.1), blood cultures, antibiotics, small fluid bolus and 1U PRBC (for Hgb 6.4).  Transfusing now and patient currently afebrile.  Held tube feed overnight due to concerns for further aspiration.  Follow-up lactic drawn and will continue to monitor.

## 2021-02-25 NOTE — Progress Notes (Signed)
[facility] NEPHROLOGY PROGRESS NOTE  Assessment/ Plan:  # Sepsis/aspiration pneumonia with ventilator dependent respiratory failure: Status post tracheostomy, remains on tracheal collar with oxygen supplementation.  Per primary and pulmonary team.  Another aspiration event 10/18, on Vanc/Zosyn.    #. Dialysis dependent acute kidney injury on chronic kidney disease stage IIIb: This is due to ischemic ATN and on dialysis dependent for more than 39-monththerefore he is now ESRD.  Plan for ongoing HD on TTS schedule.  He is not physically capable for outpatient dialysis at this time and will need significant OT/PT for physical debilitation arising from prolonged hospitalization/underlying illnesses before being able to tolerate outpatient dialysis.  2K, 4h, TDC, Tight Heparin. 2-3L UF SBP> 105  # Anemia: Without overt blood loss and holding ESA in the setting of malignancy.  PRBC transfusion as needed for hemoglobin <8.  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  Calcium level acceptable.  Continue to monitor.    #  Stage IIIb colon cancer: Status post partial colectomy/colostomy and cycle 3 out of 4 of chemotherapy.  His last chemotherapy cycle was limited by physical status/deconditioning and toxicity from medications.  # Hypertension: Blood pressure intermittently low, not on antihypertensive therapy- will try pre HD midodrine   Subjective:  HD yesterday, had some IDH limiting UF to < 1L Emesis yesterday evening, aspiratated, now febrile and WBC up Started on Vanc/Zosyn Remains on TC 28% FIO2 Hb 9to 6.4this AM 2u PRBC ordered  Objective Vital signs in last 24 hours: Vitals:   02/25/21 0500 02/25/21 0525 02/25/21 0605 02/25/21 0733  BP:  (!) 89/68 (!) 88/66 108/69  Pulse:  95 93 93  Resp:  19 19 17   Temp:  98.9 F (37.2 C) 98.3 F (36.8 C)   TempSrc:  Axillary Axillary   SpO2:  100% 100% 100%  Weight: 100.2 kg     Height:       Weight change:  -0.1 kg  Intake/Output Summary (Last 24 hours) at 02/25/2021 0855 Last data filed at 02/25/2021 0516 Gross per 24 hour  Intake 1357.58 ml  Output 2882 ml  Net -1524.42 ml        Labs: Basic Metabolic Panel: Recent Labs  Lab 02/21/21 0440 02/22/21 0334 02/24/21 0840 02/25/21 0149  NA 130* 130* 126* 130*  K 3.5 4.4 3.7 4.3  CL 92* 94* 87* 94*  CO2 26 23 25  20*  GLUCOSE 169* 135* 152* 139*  BUN 59* 40* 84* 36*  CREATININE 3.82* 3.13* 4.11* 2.94*  CALCIUM 9.6 9.2 9.2 8.8*  PHOS 6.0* 3.5 6.3*  --     Liver Function Tests: Recent Labs  Lab 02/21/21 0440 02/22/21 0334 02/24/21 0840  ALBUMIN 2.3* 2.6* 2.2*    No results for input(s): LIPASE, AMYLASE in the last 168 hours. No results for input(s): AMMONIA in the last 168 hours. CBC: Recent Labs  Lab 02/21/21 0440 02/22/21 0334 02/23/21 0811 02/24/21 0840 02/25/21 0159  WBC 15.8* 24.7* 21.3* 16.6* 28.1*  NEUTROABS 11.4* 18.8* 15.2*  --   --   HGB 9.3* 10.4* 11.1* 9.1* 6.4*  HCT 27.2* 30.7* 31.8* 26.3* 18.4*  MCV 81.9 81.6 80.5 79.9* 80.7  PLT 382 415* 391 363 432*    Cardiac Enzymes: No results for input(s): CKTOTAL, CKMB, CKMBINDEX, TROPONINI in the last 168 hours. CBG: Recent Labs  Lab 02/24/21 1715 02/24/21 2028 02/24/21 2330 02/25/21 0355 02/25/21 0805  GLUCAP 118* 148* 149* 137* 104*     Iron Studies:  No results for input(s): IRON, TIBC, TRANSFERRIN, FERRITIN in the last 72 hours. Studies/Results: DG CHEST PORT 1 VIEW  Result Date: 02/24/2021 CLINICAL DATA:  Short of breath EXAM: PORTABLE CHEST 1 VIEW COMPARISON:  02/23/2021 FINDINGS: Tracheostomy tube and central venous lines are unchanged. There is patchy airspace disease in the RIGHT upper lobe. Lung bases relatively clear. No focal pulmonary edema. IMPRESSION: Airspace disease in the RIGHT upper lobe suggest pneumonia. Electronically Signed   By: Suzy Bouchard M.D.   On: 02/24/2021 20:12    Medications: Infusions:  sodium chloride  250 mL (02/25/21 0436)   anticoagulant sodium citrate     famotidine (PEPCID) IV     levETIRAcetam     piperacillin-tazobactam (ZOSYN)  IV 2.25 g (02/25/21 0436)   promethazine (PHENERGAN) injection (IM or IVPB) 12.5 mg (02/24/21 1950)   vancomycin      Scheduled Medications:  sodium chloride   Intravenous Once   chlorhexidine  15 mL Mouth Rinse BID   Chlorhexidine Gluconate Cloth  6 each Topical Q0600   diclofenac Sodium  4 g Topical QID   hydrocerin   Topical BID   insulin aspart  0-15 Units Subcutaneous Q4H   insulin detemir  10 Units Subcutaneous Daily   mouth rinse  15 mL Mouth Rinse q12n4p   pantoprazole (PROTONIX) IV  40 mg Intravenous Q12H   polyethylene glycol  17 g Per Tube Daily    have reviewed scheduled and prn medications.  Physical Exam: General:NAD, somnolent but able to open eyes.  Heart:RRR, s1s2 nl Lungs: Coarse breath sound bilaterally, on TCX Abdomen:soft, feeding gastric tube and right lower quadrant colostomy present. Extremities:No edema Dialysis Access: Left IJ TDC in place, c/d/I w/o erythema or purulence  Kyli Sorter B Leone Mobley 02/25/2021,8:55 AM  LOS: 86 days

## 2021-02-25 NOTE — Progress Notes (Addendum)
Pharmacy Antibiotic Note  Thomas [name] is a 65 y.o. male admitted on 11/30/2020 with complex medical history and extended hospital stay for AKI on CKD, sepsis and now concern for aspiration pneumonia.  Pharmacy originally consulted for Unasyn - MD expanding coverage to Zosyn.  10/19 AM pharmacy consulted to start Vancomycin for sepsis.   Patient AKI on CKD now has ESRD, nephrology plans HD -everyTTS.   HD last done 02/24/21.  Vomited after HD 10/18.  Develop fever to 101.6 last night.  LA up to 3.1 >2.0.  Previously received  IV vancomycin 10/5-10/6/22 this admission, last dose given 10/6 . He has received four HD sessions since last vanc dose, thus  vancomycin expected to be cleared at this time.     Plan: Give Vancomycin 2000 mg IV x1  now then 1000 mg IV at end of every HD-TTS Zosyn 2.25gm IV q8h Will f/u micro data, and pt's clinical condition and pre HD vancomycin level per protocol.   Height: 5\' 8"  (172.7 cm) Weight: 100.2 kg (220 lb 14.4 oz) IBW/kg (Calculated) : 68.4  Temp (24hrs), Avg:99.4 F (37.4 C), Min:97.1 F (36.2 C), Max:101.6 F (38.7 C)  Recent Labs  Lab 02/20/21 0351 02/21/21 0440 02/22/21 0334 02/23/21 0811 02/24/21 0840 02/25/21 0149 02/25/21 0159 02/25/21 0454  WBC 15.4* 15.8* 24.7* 21.3* 16.6*  --  28.1*  --   CREATININE 2.88* 3.82* 3.13*  --  4.11* 2.94*  --   --   LATICACIDVEN  --   --   --   --   --   --  3.1* 2.0*     Estimated Creatinine Clearance: 29.1 mL/min (A) (by C-G formula based on SCr of 2.94 mg/dL (H)).    Allergies  Allergen Reactions   Morphine And Related Other (See Comments)    Throat swelling    Antimicrobials this admission: Fluconazole 9/09 >> 9/15, 9/22>>9/27 Ceftazidime 9/08>>9/18 Nystatin oral 9/09>>9/22, 9/26 >>10/10 Cefepime 7/24 >> 7/28, 7/31 >> 8/07   Vanc x1 7/25, 8/01 >> 8/02,  10/5>> 10/07 (restarted 10/19 see below) Unasyn 7/30 >> 7/31; 9/19>>9/22; restart 10/18>>10/19 Flagyl 7/31 >> 8/07 Eraxis 8/01 >>  8/02 Cefazolin 8/15 >8/16 Ceftriaxone 8/16> 8/17; x 1 on 9/07 Cefepime 8/17>8/22 Zosyn 10/5>>10/09; restart 10/19>> Vanc restart 10/19>>  Microbiology results: 7/31 Trach: candida tropicalis (likely not pathogenic) 8/15 TA: abundant GPCs, Pseudomonas 9/07 TA: moderate Pseudomonas 9/07 UCx: multiple species 9/19 UCx (cath): yeast 10/05 TA>>pseudomonas - likely colonizer at this point. Pan sensitive. 10/19 BCx: sent 10/19 BCx: sent   Thank you for allowing pharmacy to be a part of this patient's care.  Nicole Cella, RPh Clinical Pharmacist [phone] Please see amion for complete clinical pharmacist phone list 02/25/2021 8:42 AM

## 2021-02-25 NOTE — Progress Notes (Signed)
Physical Therapy Treatment Patient Details Name: Thomas [name] MRN: [id number] DOB: Mar 29, 1956 Today's Date: 02/25/2021   History of Present Illness 65 y.o. male admitted 7/24 with high ostomy output. Pt with severe sepsis and multisystem organ failure. Hospital admission complicated by volume overload, aspiration pneumonia, acute respiratory failure, AKI and ischemic colitis. Intubated 7/30. S/p trach placement 8/11. [facility] 8/12. HD initiated 8/12. To IR 8/15 for tunneled HD cath. Pulled out trach x2 8/22 replaced by PCCM. Pulled out trach again 8/25 but unable to be reinserted with pt intubated. 8/29 to OR for stoma clean up and trach replacement.Trach downsized 9/10. 10/5 respiratory distress with mucous plug requiring bronch, trach change and ventilator 10/5-10/6.  10/6 transitioned to trach collar, 10/7 pulled trach and inserted by RT with no complications. 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    PT Comments    Pt tolerates treatment well, progressing to transfer training with PT assistance. Pt remains weak but with increased time demonstrates the ability to initiate bed mobility with lower extremities. Pt requires significant assistance to stand at this time and is only able to tolerate brief periods of standing currently. Pt will benefit from continued aggressive mobilization and PT services to improve LE strength and to progress to out of bed mobility. Increased time out of bed and sitting upright will likely be beneficial for pulmonary function and overall wellbeing.    Recommendations for follow up therapy are one component of a multi-disciplinary discharge planning process, led by the attending physician.  Recommendations may be updated based on patient status, additional  functional criteria and insurance authorization.  Follow Up Recommendations  SNF     Equipment Recommendations  Wheelchair (measurements PT);Wheelchair cushion (measurements PT);Other (comment);Hospital bed (mechanical lift)    Recommendations for Other Services       Precautions / Restrictions Precautions Precautions: Fall Precaution Comments: R subclavian tunneled HD cath, ostomy, peg, trach Restrictions Weight Bearing Restrictions: No     Mobility  Bed Mobility Overal bed mobility: Needs Assistance Bed Mobility: Supine to Sit;Sit to Supine     Supine to sit: Max assist;HOB elevated Sit to supine: Total assist   General bed mobility comments: pt mobilizes BLE to edge with increased time, pt able to assist into sitting by pulling with UE support of PT    Transfers Overall transfer level: Needs assistance Equipment used: 1 person hand held assist Transfers: Sit to/from Stand Sit to Stand: Max assist;From elevated surface         General transfer comment: PT provides R knee block and BUE support, verbal cues for hip and trunk extension. Pt performs 3 sit to stand attempts, successfully standing in 2 of 3  Ambulation/Gait                 Stairs             Wheelchair Mobility    Modified Rankin (Stroke Patients Only)       Balance Overall balance assessment: Needs assistance Sitting-balance support: Single extremity supported;Feet supported Sitting balance-Leahy Scale: Poor Sitting balance - Comments: minG-minA at edge of bed   Standing balance support: Bilateral upper extremity supported Standing balance-Leahy Scale: Poor Standing balance comment: maxA with BUE support of PT  Cognition Arousal/Alertness: Awake/alert Behavior During Therapy: Impulsive Overall Cognitive Status: Impaired/Different from baseline Area of Impairment: Orientation;Attention;Memory;Following  commands;Safety/judgement;Awareness;Problem solving                 Orientation Level: Time Current Attention Level: Sustained Memory: Decreased short-term memory Following Commands: Follows one step commands with increased time Safety/Judgement: Decreased awareness of safety;Decreased awareness of deficits Awareness: Intellectual Problem Solving: Slow processing;Difficulty sequencing;Requires verbal cues;Requires tactile cues        Exercises      General Comments General comments (skin integrity, edema, etc.): VSS on 5L 28% FiO2 trach collar      Pertinent Vitals/Pain Pain Assessment: Faces Faces Pain Scale: Hurts even more Pain Location: BLE Pain Descriptors / Indicators: Sore Pain Intervention(s): Monitored during session    Home Living                      Prior Function            PT Goals (current goals can now be found in the care plan section) Acute Rehab PT Goals Patient Stated Goal: to walk again PT Goal Formulation: With patient Time For Goal Achievement: 03/11/21 Potential to Achieve Goals: Fair Progress towards PT goals: Progressing toward goals    Frequency    Min 2X/week      PT Plan Current plan remains appropriate    Co-evaluation              AM-PAC PT "6 Clicks" Mobility   Outcome Measure  Help needed turning from your back to your side while in a flat bed without using bedrails?: A Lot Help needed moving from lying on your back to sitting on the side of a flat bed without using bedrails?: A Lot Help needed moving to and from a bed to a chair (including a wheelchair)?: Total Help needed standing up from a chair using your arms (e.g., wheelchair or bedside chair)?: A Lot Help needed to walk in hospital room?: Total Help needed climbing 3-5 steps with a railing? : Total 6 Click Score: 9    End of Session Equipment Utilized During Treatment: Oxygen Activity Tolerance: Patient tolerated treatment well Patient  left: in bed;with call bell/phone within reach;with bed alarm set;with family/visitor present Nurse Communication: Mobility status;Need for lift equipment PT Visit Diagnosis: Muscle weakness (generalized) (M62.81);Other abnormalities of gait and mobility (R26.89)     Time: 1657-1730 PT Time Calculation (min) (ACUTE ONLY): 33 min  Charges:  $Therapeutic Activity: 23-37 mins                     Zenaida Niece, PT, DPT Acute Rehabilitation Pager: [phone] Office Carnuel Lashonne Shull 02/25/2021, 5:56 PM

## 2021-02-25 NOTE — Consult Note (Signed)
NAME:  Thomas [name], MRN:  [id number], DOB:  January 11, 1956, LOS: 86 ADMISSION DATE:  11/30/2020, CONSULTATION DATE:  02/25/21 REFERRING MD:  Tyrell Antonio, MD, CHIEF COMPLAINT:  hypotension   Brief History:  Thomas [name] is a 65 y.o. with a pertinent PMH of CVA, colon cancer s/p colectomy/colostomy, T2DM, ESRD (on HD), Epilepsy, HTN, HLD, OSA, DVT (lovenox), who presented in August  after recent surgery for colon cancer s/p resection with signs and symptoms of sepsis and worsening respiratory distress  admitted for aspiration pneumonia. Hospital course complicated by ischemic colitis and prolonged respiratory failure requiring tracheostomy and recently developed mucus plug requiring transfer to the ICU for MV. He had significant emesis on 10/18 and overnight developed a fever, SBP in the 80s, and worsening mentation. PCCM consulted for possible sepsis in the setting of aspiration pneumonia.   History of Present Illness:  Per primary-  65 y.o. male with a history of T2DM with neuropathy, HTN, HLD., colon cancer s/p resection and colostomy May 2022 s/p 3 cycles chemotherapy (last was July 2022). Followed by Dr. Benay Spice. Started CAPOX June 2022, reduced cepecitabine dose starting 11/19/2020 due to hand/foot syndrome, reduced oxaliplatin due to renal impairment and poor performance status. Presented to the ED 7/24 with abdominal pain found to be febrile, tachycardic, tachypneic. Lactic acid was 6.6 and persisted at 6.8 after sepsis IV fluid bolus and cefepime. He had ARF, demand ischemia, bandemia, was hyperglycemic with negative ketones, with lactic acidosis. He developed AFib with RVR in the ED, was started on diltiazem, and was admitted to the ICU for septic shock with unclear source. Sepsis was subsequently felt to be ruled out at time of admission, presentation felt more likely to be related to cepacitabine toxicity with oral mucositis, hand/foot syndrome, and diarrhea/high output colostomy.    He was  transferred to hospitalist service 7/27 but developed metabolic encephalopathy with hyoxia due to aspiration pneumonia on 7/30 requiring intubation and pressor support. Failed extubation with inability to clear secretions, requiring reintubation 8/8, and tracheostomy subsequently placed 8/11. This was pulled out 8/22, replaced under bronch guidance, again removed 8/25 requiring reintubation after failed reinsertion. ENT revised tracheostomy in the OR 8/29. He has required IHD for ARF, and also had complications of RLE DVT now on eliquis, colitis thought to be ischemic 8/2 not requiring specific intervention, Pseudomonas pneumonia having completed treatment, agitated encephalopathy which has improved, anemia requiring 4u PRBCs total, and thrombocytopenia which has resolved. On 9/4 he was transferred to hospitalist service having tolerated trach collar.  Patient was also evaluated by Dr. Benay Spice during this admission clarified that patient was receiving curative chemotherapy prior to admission   On 10/7 patient had an episode of significant hypoxemia due to mucous plugging.  This required brief transfer to the ICU and brief mechanical ventilation until acute bronchoscopy could be completed.  He has had issues with difficult to manage neuropathy that was present prior to admission and caused by Xeloda.  After dialysis on 10/18 patient had nausea and vomiting.  Overnight into 10/19 she had a T-max of 101.6 F axillary which is equivalent to an oral temperature of 102.6.  Sepsis work-up was initiated.  Chest x-ray with lobe airspace disease.  Patient was noted to have a.m. hemoglobin of 6.4 and was given packed red blood cells.  CT abdomen without evidence of bleeding including retroperitoneal hematoma.  Initial lactic acid was 3.1 consistent with evolving sepsis.  Patient did have hypotension into the 80s through most of the night.  Past Medical History:  CVA (Lt MCA) Asthma T2DM Epilepsy  HTN HLD OSA Brain  tumor s/p craniotomy   Significant Hospital Events:  7/27 Transferred to [facility], stopped abx as sepsis resolved/ruled out 7/30 AM Worsened AME, more hypoxic with worsening renal funciton, new aspiration on abx restarted 7/30 PM Pt with increased o2 requirement on 100% FIO2 on BIPAP, d/w family who wanted to proceed with intubation, pt intubated 7/31 Tracheal aspirate grew moderate candida tropicalis  8/1 Some issues with tachycardia and hypotension overnight. Reproted high ostomy output.  GI PCR negative 8/2 down to 9 mcg of Levophed, however repeat CT abdomen/pelvis showed inflammatory wall thickening and fat stranding of the descending and sigmoid colon, new since prior, consistent with nonspecific infectious, inflammatory, or ischemic colitis.  Also new dense consolidative airspace opacity in the right lower and right middle lobes 8/3 further weaning NE. Weaning sedation. Full code, full scope per family meeting 8/2. Eraxis stopped (8/1-8/3) 8/4 off pressors, plt down to 103.  8/5 remains off pressors. Moving around more, not really following commands though  8/9 failed extubation 8/8, tolerated nasal cannula for about an hour, desaturations and inability to clear secretions-reintubated 8/8 8/10 some agitation overnight 8/11 tracheostomy placement On trach collar since 8/16 8/22 pulled trach out, replaced under bronch guidance. Has proximal area of granulation tissue that is above the bottom of the trach tube.  8/25 Removed Trach, unable to reinsert, despite multiple attempts re-intubated 8/29 Anemia, 1 unit PRBC. ENT re-placed Trach in [place]. 9/8 tracheal aspirate growing pan-sensitive P. Aeruginosa. Started on Ceftazidine  9/19 Continues to have thick secretions.  10/5 had respiratory distress and increase in O2 requirements, frequent suctioning. Ct chest showed large mucus plug in the Lft upper/lower lobes, transferred to the ICU for mechanical ventilation. 10/6 wean sedation and SBT via trach  collar 10/7 episode of significant hypoxemia due to mucous plugging.  Transferred to ICU for brief mechanical ventilation and bronchoscopy 10/18 significant emesis 10/19 febrile to T-max of 101.6 chest x-ray showed right lower lobe airspace opacity.  Hemoglobin 6.4 given 1 unit PRBCs.  CT abdomen and CT head negative for any acute abnormality.  Consults:  PCCM Nephrology  Procedures:  8/11 tracheostomy placement 8/29 ENT replace tracheostomy in the OR after patient moved 10/7 brief mechanical ventilation and bronchoscopy  Significant Diagnostic Tests:  CT ABDOMEN PELVIS WO CONTRAST  Result Date: 02/25/2021 CLINICAL DATA:  Emesis and aspiration. Retroperitoneal hematoma suspected. EXAM: CT ABDOMEN AND PELVIS WITHOUT CONTRAST TECHNIQUE: Multidetector CT imaging of the abdomen and pelvis was performed following the standard protocol without IV contrast. COMPARISON:  Radiography 3 days ago.  CT 01/30/2021. FINDINGS: Lower chest: Worsening of patchy infiltrates at both lung bases consistent with pneumonia. Hepatobiliary: Liver parenchyma appears normal without contrast. No calcified gallstones. Pancreas: Normal Spleen: Normal Adrenals/Urinary Tract: Adrenal glands are normal. Kidneys are normal except for a 4 cm cyst on the left as seen previously. Foley catheter in the bladder. Stomach/Bowel: Stomach and small intestine are normal except for the presence of a gastrostomy tube. There is been colectomy with right upper quadrant ostomy the does not show complicating features. Distal colon appears unremarkable. Vascular/Lymphatic: Aortic atherosclerosis. No aneurysm. IVC is normal. No retroperitoneal adenopathy. Reproductive: Normal Other: Bilateral inguinal hernias containing fat, left more than right. Musculoskeletal: Chronic degenerative changes of the lumbar spine with apparent multilevel spinal stenosis. IMPRESSION: Worsening bilateral lower lobe pulmonary infiltrates. No acute abdominal or pelvic  region finding. No evidence of retroperitoneal hemorrhage. Right upper quadrant ostomy  and gastrostomy without complicating features. Aortic Atherosclerosis (ICD10-I70.0). Electronically Signed   By: Nelson Chimes M.D.   On: 02/25/2021 11:35   DG Abd 1 View  Result Date: 02/22/2021 CLINICAL DATA:  Abdominal pain EXAM: ABDOMEN - 1 VIEW COMPARISON:  02/06/2021 FINDINGS: Weighted feeding catheter has been removed in the interval. Gastrostomy catheter is noted over the stomach. Scattered large and small bowel gas is seen. No obstructive changes are noted. IMPRESSION: Gastrostomy catheter within the stomach. No other focal abnormality is seen. Electronically Signed   By: Inez Catalina M.D.   On: 02/22/2021 00:30   CT HEAD WO CONTRAST (5MM)  Result Date: 02/25/2021 CLINICAL DATA:  Mental status change. EXAM: CT HEAD WITHOUT CONTRAST TECHNIQUE: Contiguous axial images were obtained from the base of the skull through the vertex without intravenous contrast. COMPARISON:  12/05/2020 FINDINGS: Brain: Stable cerebral atrophy with chronic encephalomalacia in the left frontal lobe. No evidence for acute hemorrhage, mass lesion, midline shift, hydrocephalus or large new infarct. Vascular: No hyperdense vessel or unexpected calcification. Skull: Prior left craniotomy. Sinuses/Orbits: New fluid in the mastoid air cells bilaterally. Small amount of fluid or mucosal thickening in the left sphenoid sinus. Mild mucosal disease in the bilateral maxillary sinuses. Other: None IMPRESSION: 1. No acute intracranial abnormality. 2. Fluid in the bilateral mastoid air cells. Mild paranasal sinus disease. 3. Stable encephalomalacia in the left frontal lobe and previous left craniotomy. Electronically Signed   By: Markus Daft M.D.   On: 02/25/2021 11:35   DG CHEST PORT 1 VIEW  Result Date: 02/24/2021 CLINICAL DATA:  Short of breath EXAM: PORTABLE CHEST 1 VIEW COMPARISON:  02/23/2021 FINDINGS: Tracheostomy tube and central venous lines  are unchanged. There is patchy airspace disease in the RIGHT upper lobe. Lung bases relatively clear. No focal pulmonary edema. IMPRESSION: Airspace disease in the RIGHT upper lobe suggest pneumonia. Electronically Signed   By: Suzy Bouchard M.D.   On: 02/24/2021 20:12   DG CHEST PORT 1 VIEW  Result Date: 02/23/2021 CLINICAL DATA:  Leukocytosis, aspiration pneumonia EXAM: PORTABLE CHEST 1 VIEW COMPARISON:  Chest radiograph 02/14/2021 FINDINGS: The endotracheal tube tip is approximately 2.8 cm from the carina. The right chest wall port and tunneled left-sided central venous catheter are in stable position with the tips at the cavoatrial junction. The cardiomediastinal silhouette is stable. Lung volumes are low. There is no focal consolidation or pulmonary edema. There is no pleural effusion or pneumothorax. There is no acute osseous abnormality. A gastrostomy tube is seen over the left upper quadrant. IMPRESSION: 1. Support devices as above in satisfactory position. 2. Low lung volumes. Otherwise, no radiographic evidence of acute cardiopulmonary process. Electronically Signed   By: Valetta Mole M.D.   On: 02/23/2021 10:48     Micro Data:  10/19 urine and blood cultures pending  Antimicrobials:  10/18 Unasyn  10/19 Zoysn and Vanc >   Interim History / Subjective:  Patient had significant emesis on 10/18.  Overnight developed fever T-max of 500.9, with systolic blood pressures in the 80s, lactic acidosis and worsening mentation.  PCCM consulted for concern of sepsis.  Objective   Blood pressure (!) 89/70, pulse 87, temperature 97.7 F (36.5 C), temperature source Axillary, resp. rate 15, height 5\' 8"  (1.727 m), weight 100.2 kg, SpO2 100 %.    FiO2 (%):  [28 %] 28 %   Intake/Output Summary (Last 24 hours) at 02/25/2021 1319 Last data filed at 02/25/2021 0900 Gross per 24 hour  Intake 2155.5 ml  Output 2882 ml  Net -726.5 ml   Filed Weights   02/24/21 1033 02/24/21 1430 02/25/21 0500   Weight: 102.8 kg 101.6 kg 100.2 kg    Examination: General: Ill-appearing man, resting comfortably, no acute distress HENT: [place]/AT, PERRLA Lungs: Normal effort, tracheostomy on 5 L Cardiovascular: Regular rate and rhythm, no murmurs Abdomen: Soft, nondistended, nontender Extremities: No edema Neuro: Lethargic, arousable nods to some questions but not following simple commands Skin: Warm and dry  Resolved Hospital Problem list     Assessment & Plan:  Sepsis in the setting of aspiration pneumonia Acute hypoxic respiratory failure status post tracheostomy Long complicated hospital course after diagnosis of colon cancer status post colectomy/colostomy in August.  He has had recurrent aspiration episodes with mucous plugging and recently status post bronchoscopy to help mucus clearance on 10/7.  He had a tracheostomy in August and was dependent on mechanical ventilation initially.  Two nights ago patient had significant emesis and overnight developed fever, altered mentation lactic acidosis and worsening leukocytosis.  Chest radiograph shows right upper lobe infiltrate.  Systolic blood pressures overnight were in the 80s however now normotensive with maps well above goal.  CT head was negative for any acute etiology.  Lactic acid and hypotension improved with IV fluid bolus.  UA showed moderate leukocytes and nitrates, greater than 50 WBCs.  Blood cultures and urine culture pending.  Recommend continuing IV fluid boluses as needed and antibiotic coverage for aspiration pneumonia.  Do not believe patient needs ICU transfer at this time. -Continue broad-spectrum antibiotics -Follow blood and urine cultures -IV fluid boluses as needed  Symptomatic anemia Hemoglobin 6.4 status post 1 unit PRBC.  Blood pressures also improved after PRBCs and IV fluids.   -Monitor for signs or symptoms of bleed -Transfusion goal less than 7  AKI on CKD 3B Presumed ischemic ATN Nephrology is following. Doubt he  is uremic as his last inpatient hemodialysis was yesterday.  Other chronic conditions as per primary team- Colon cancer status post colectomy/colostomy Severe peripheral neuropathy Reflux esophagitis/duodenal ulcers Urinary retention Dysphagia CVA Right leg DVT New onset A. fib with RVR Seizure disorder 2 diabetes Hypertension and hyperlipidemia  Best practice (evaluated daily)  Diet: NPO DVT prophylaxis: Full dose lovenox GI prophylaxis: PPI Glucose control: SSI Mobility: bedbound Disposition: Continue current level of care  Goals of Care:  Last date of multidisciplinary goals of care discussion:10/19 Family and staff present: Yes Summary of discussion: Do not recommend ICU transfer. Continue antibiotics and IVF prn Follow up goals of care discussion due: n/a Code Status: Full code  Labs   CBC: Recent Labs  Lab 02/19/21 0500 02/20/21 0351 02/21/21 0440 02/22/21 0334 02/23/21 0811 02/24/21 0840 02/25/21 0159  WBC 11.8* 15.4* 15.8* 24.7* 21.3* 16.6* 28.1*  NEUTROABS 8.0* 10.1* 11.4* 18.8* 15.2*  --   --   HGB 8.7* 9.5* 9.3* 10.4* 11.1* 9.1* 6.4*  HCT 25.7* 27.2* 27.2* 30.7* 31.8* 26.3* 18.4*  MCV 82.9 81.0 81.9 81.6 80.5 79.9* 80.7  PLT 348 391 382 415* 391 363 432*    Basic Metabolic Panel: Recent Labs  Lab 02/19/21 0500 02/20/21 0351 02/21/21 0440 02/22/21 0334 02/24/21 0840 02/25/21 0149  NA 131* 131* 130* 130* 126* 130*  K 3.7 3.7 3.5 4.4 3.7 4.3  CL 94* 95* 92* 94* 87* 94*  CO2 24 25 26 23 25  20*  GLUCOSE 159* 156* 169* 135* 152* 139*  BUN 67* 35* 59* 40* 84* 36*  CREATININE 3.93* 2.88* 3.82* 3.13* 4.11* 2.94*  CALCIUM 9.3 9.1 9.6 9.2 9.2 8.8*  PHOS 4.8* 2.4* 6.0* 3.5 6.3*  --    GFR: Estimated Creatinine Clearance: 29.1 mL/min (A) (by C-G formula based on SCr of 2.94 mg/dL (H)). Recent Labs  Lab 02/22/21 0334 02/23/21 0811 02/24/21 0840 02/25/21 0159 02/25/21 0454  PROCALCITON  --   --   --  0.95  --   WBC 24.7* 21.3* 16.6* 28.1*  --    LATICACIDVEN  --   --   --  3.1* 2.0*    Liver Function Tests: Recent Labs  Lab 02/19/21 0500 02/20/21 0351 02/21/21 0440 02/22/21 0334 02/24/21 0840  ALBUMIN 2.1* 2.3* 2.3* 2.6* 2.2*   No results for input(s): LIPASE, AMYLASE in the last 168 hours. No results for input(s): AMMONIA in the last 168 hours.  ABG    Component Value Date/Time   PHART 7.381 02/12/2021 0549   PCO2ART 36.8 02/12/2021 0549   PO2ART 123 (H) 02/12/2021 0549   HCO3 21.8 02/12/2021 0549   TCO2 23 02/12/2021 0549   ACIDBASEDEF 3.0 (H) 02/12/2021 0549   O2SAT 99.0 02/12/2021 0549     Coagulation Profile: No results for input(s): INR, PROTIME in the last 168 hours.  Cardiac Enzymes: No results for input(s): CKTOTAL, CKMB, CKMBINDEX, TROPONINI in the last 168 hours.  HbA1C: Hgb A1c MFr Bld  Date/Time Value Ref Range Status  02/06/2021 02:20 PM 6.4 (H) 4.8 - 5.6 % Final    Comment:    REPEATED TO VERIFY (NOTE) Pre diabetes:          5.7%-6.4%  Diabetes:              >6.4%  Glycemic control for   <7.0% adults with diabetes   12/01/2020 04:32 AM 8.7 (H) 4.8 - 5.6 % Final    Comment:    (NOTE)         Prediabetes: 5.7 - 6.4         Diabetes: >6.4         Glycemic control for adults with diabetes: <7.0     CBG: Recent Labs  Lab 02/24/21 2028 02/24/21 2330 02/25/21 0355 02/25/21 0805 02/25/21 1205  GLUCAP 148* 149* 137* 104* 103*    Review of Systems:   Unable to obtain due to patient's poor mentation.  Past Medical History:  He,  has a past medical history of Acute ischemic left MCA stroke ([place]) (04/09/2014), Acute respiratory failure with hypoxia ([place]) (04/09/2014), Aspiration pneumonia ([place]), Asthma, Brain tumor ([place]), Cancer ([place]), Confusion, Diabetes mellitus without complication ([place]), Dizziness, Dyspnea, Epilepsy ([place]), GERD (gastroesophageal reflux disease), Headache, Hyperlipidemia, Hypertension, Peripheral edema, Peripheral neuropathy, Pneumonia (3 yrs ago), Seizures ([place]),  Sleep apnea, and Urinary frequency.   Surgical History:   Past Surgical History:  Procedure Laterality Date   BIOPSY  09/05/2020   Procedure: BIOPSY;  Surgeon: Irene Shipper, MD;  Location: [facility] ENDOSCOPY;  Service: Endoscopy;;   BIOPSY  02/06/2021   Procedure: BIOPSY;  Surgeon: Jerene Bears, MD;  Location: [facility] ENDOSCOPY;  Service: Gastroenterology;;   CARDIAC CATHETERIZATION  7 yrs ago   COLONOSCOPY WITH PROPOFOL N/A 09/05/2020   Procedure: COLONOSCOPY WITH PROPOFOL;  Surgeon: Irene Shipper, MD;  Location: [facility] ENDOSCOPY;  Service: Endoscopy;  Laterality: N/A;   CRANIOTOMY N/A 09/01/2016   Procedure: CRANIOTOMY TUMOR  LEFT PTERIONAL;  Surgeon: Ashok Pall, MD;  Location: [place];  Service: Neurosurgery;  Laterality: N/A;   cyst removed from chest      as a child  ESOPHAGOGASTRODUODENOSCOPY (EGD) WITH PROPOFOL N/A 02/06/2021   Procedure: ESOPHAGOGASTRODUODENOSCOPY (EGD) WITH PROPOFOL;  Surgeon: Jerene Bears, MD;  Location: [place];  Service: Gastroenterology;  Laterality: N/A;   IR FLUORO GUIDE CV LINE LEFT  12/22/2020   IR GASTROSTOMY TUBE MOD SED  02/10/2021   IR US GUIDE VASC ACCESS LEFT  12/22/2020   NO PAST SURGERIES     PARTIAL COLECTOMY N/A 09/10/2020   Procedure: TRANSVERSE COLECTOMY;  Surgeon: [name], MD;  Location: [place];  Service: General;  Laterality: N/A;   POLYPECTOMY  09/05/2020   Procedure: POLYPECTOMY;  Surgeon: Irene Shipper, MD;  Location: [place];  Service: Endoscopy;;   PORTACATH PLACEMENT Right 10/01/2020   Procedure: INSERTION PORT-A-CATH;  Surgeon: [name], MD;  Location: [place];  Service: General;  Laterality: Right;   SUBMUCOSAL TATTOO INJECTION  09/05/2020   Procedure: SUBMUCOSAL TATTOO INJECTION;  Surgeon: Irene Shipper, MD;  Location: [facility] ENDOSCOPY;  Service: Endoscopy;;   TRACHEOSTOMY TUBE PLACEMENT N/A 01/05/2021   Procedure: TRACHEOSTOMY;  Surgeon: [name], MD;  Location: [place];  Service: ENT;  Laterality: N/A;     Social History:    reports that he has quit smoking. He has never used smokeless tobacco. He reports that he does not drink alcohol and does not use drugs.   Family History:  His family history includes Breast cancer in his maternal aunt and mother; Prostate cancer in his maternal uncle.   Allergies Allergies  Allergen Reactions   Morphine And Related Other (See Comments)    Throat swelling     Home Medications  Prior to Admission medications   Medication Sig Start Date End Date Taking? Authorizing Provider  acetaminophen (TYLENOL) 500 MG tablet Take 1,000 mg by mouth 3 (three) times daily as needed for headache (pain).   Yes [provider]  albuterol (PROVENTIL HFA;VENTOLIN HFA) 108 (90 BASE) MCG/ACT inhaler Inhale 2 puffs into the lungs every 4 (four) hours as needed for wheezing or shortness of breath. 04/09/15  Yes Muthersbaugh, Jarrett Soho, PA-C  amLODipine (NORVASC) 10 MG tablet Take 10 mg by mouth daily. 05/11/20  Yes [provider]  capecitabine (XELODA) 500 MG tablet Take 3 tablets (1,500 mg total) by mouth 2 (two) times daily after a meal. 11/19/20  Yes Ladell Pier, MD  gabapentin (NEURONTIN) 300 MG capsule Take 1 capsule (300 mg total) by mouth 3 (three) times daily. 04/06/16  Yes Varney Biles, MD  levETIRAcetam (KEPPRA) 1000 MG tablet Take 1,000 mg by mouth 2 (two) times daily. 07/28/16  Yes [provider]  metFORMIN (GLUCOPHAGE-XR) 500 MG 24 hr tablet Take 500 mg by mouth 2 (two) times daily. 07/27/20  Yes [provider]  omeprazole (PRILOSEC) 20 MG capsule Take 20 mg by mouth daily. 08/10/16  Yes [provider]  atorvastatin (LIPITOR) 20 MG tablet Take 20 mg by mouth daily at 6 PM.    [provider]  lidocaine-prilocaine (EMLA) cream Apply 1 application topically as directed. Apply 1/2 tablespoon to port site 2 hours prior to stick and cover with plastic wrap to numb site Patient not taking: Reported on 10/08/2020 09/24/20   Ladell Pier,  MD  ondansetron (ZOFRAN) 8 MG tablet Take 1 tablet (8 mg total) by mouth every 8 (eight) hours as needed for nausea or vomiting. Patient not taking: No sig reported 09/24/20   Ladell Pier, MD  prochlorperazine (COMPAZINE) 10 MG tablet Take 1 tablet (10 mg total) by mouth every  6 (six) hours as needed. Patient not taking: No sig reported 09/24/20   Ladell Pier, MD     [place], DO PGY-3 IMTS

## 2021-02-25 NOTE — Progress Notes (Signed)
Overnight progress note  Patient vomited after dialysis and had coarse breath sounds.  Tachycardic but no change in oxygen requirement.  Stable on 5L supplemental oxygen via trach collar.  Stat chest x-ray done and showing right upper lobe airspace disease.  MRSA PCR done during hospitalization negative.  He was given Unasyn due to concern for aspiration pneumonia.  Subsequently became febrile with temperature 101.6 F.  Tachycardic but not hypotensive.  No change in oxygen requirement. -Change Unasyn to Zosyn -Blood culture x2 -Stat CBC, lactate, procalcitonin -Tube feeds held due to concern for aspiration.  N.p.o., aspiration precautions.

## 2021-02-25 NOTE — Progress Notes (Addendum)
[facility] PROGRESS NOTE  Thomas [name] RSW:[id number] DOB: 24-May-1955 DOA: 11/30/2020 PCP: [name], NP  Status: Remains inpatient appropriate because:Persistent severe electrolyte disturbances, Unsafe d/c plan, and Inpatient level of care appropriate due to severity of illness  Dispo: The patient is from: Home              Anticipated d/c is to: SNF versus LTAC vs CIR              Patient currently is not medically stable to d/c.   Difficult to place patient Yes               Barriers to discharge: Currently has tracheostomy in place and is requiring dialysis but remains too weak to tolerate dialysis in chair which is a requirement for outpatient HD   Level of care: Progressive  Code Status: Full Family Communication: Have confirmed wife's phone number with her previously.  10/18 sent text updating her on medication changes DVT prophylaxis: FD Lovenox COVID vaccination status: Moderna 07/08/2019 and 08/11/2019 with first booster dose given 08/05/2020   HPI:  65 y.o. male with a history of T2DM with neuropathy, HTN, HLD., colon cancer s/p resection and colostomy May 2022 s/p 3 cycles chemotherapy (last was July 2022). Followed by Dr. Benay Spice. Started CAPOX June 2022, reduced cepecitabine dose starting 11/19/2020 due to hand/foot syndrome, reduced oxaliplatin due to renal impairment and poor performance status. Presented to the ED 7/24 with abdominal pain found to be febrile, tachycardic, tachypneic. Lactic acid was 6.6 and persisted at 6.8 after sepsis IV fluid bolus and cefepime. He had ARF, demand ischemia, bandemia, was hyperglycemic with negative ketones, with lactic acidosis. He developed AFib with RVR in the ED, was started on diltiazem, and was admitted to the ICU for septic shock with unclear source. Sepsis was subsequently felt to be ruled out at time of admission, presentation felt more likely to be related to cepacitabine toxicity with oral mucositis, hand/foot syndrome, and  diarrhea/high output colostomy.   He was transferred to hospitalist service 7/27 but developed metabolic encephalopathy with hyoxia due to aspiration pneumonia on 7/30 requiring intubation and pressor support. Failed extubation with inability to clear secretions, requiring reintubation 8/8, and tracheostomy subsequently placed 8/11. This was pulled out 8/22, replaced under bronch guidance, again removed 8/25 requiring reintubation after failed reinsertion. ENT revised tracheostomy in the OR 8/29. He has required IHD for ARF, and also had complications of RLE DVT now on eliquis, colitis thought to be ischemic 8/2 not requiring specific intervention, Pseudomonas pneumonia having completed treatment, agitated encephalopathy which has improved, anemia requiring 4u PRBCs total, and thrombocytopenia which has resolved. On 9/4 he was transferred to hospitalist service having tolerated trach collar.  Patient was also evaluated by Dr. Benay Spice during this admission clarified that patient was receiving curative chemotherapy prior to admission  On 10/7 patient had an episode of significant hypoxemia due to mucous plugging.  This required brief transfer to the ICU and brief mechanical ventilation until acute bronchoscopy could be completed.  He has had issues with difficult to manage neuropathy that was present prior to admission and caused by Xeloda.  After dialysis on 10/18 patient had nausea and vomiting.  Overnight into 10/19 she had a T-max of 101.6 F axillary which is equivalent to an oral temperature of 102.6.  Sepsis work-up was initiated.  Chest x-ray with lobe airspace disease.  Patient was noted to have a.m. hemoglobin of 6.4 and was given packed red blood cells.  CT abdomen without evidence of bleeding including retroperitoneal hematoma.  Initial lactic acid was 3.1 consistent with evolving sepsis.  Patient did have hypotension into the 80s through most of the night.  Subjective:  Patient awakened to voice  and tactile stimulation.  Denied back or abdominal pain and further nausea.  Objective: Vitals:   02/25/21 0800 02/25/21 0900  BP: 101/73 (!) 89/63  Pulse: 91 89  Resp: (!) 23 13  Temp:  97.7 F (36.5 C)  SpO2: 100% 99%    Intake/Output Summary (Last 24 hours) at 02/25/2021 1213 Last data filed at 02/25/2021 0900 Gross per 24 hour  Intake 2155.5 ml  Output 2882 ml  Net -726.5 ml    Filed Weights   02/24/21 1033 02/24/21 1430 02/25/21 0500  Weight: 102.8 kg 101.6 kg 100.2 kg    Exam: Constitutional: No acute distress, remains lethargic but easy to awaken-nominal or back pain Respiratory: #6 XLT cuffless trach, RA to TC, lung sounds diminished right upper lobe but coarse left upper lobe, diminished in the bases pulse oximetry 100% Cardiovascular: S1-S2, Normotensive.  Extremities warm to touch.  No peripheral edema.  Regular maintaining sinus rhythm Abdomen: Abdomen soft, nontender, PEG site unremarkable, active bowel sounds.  Colostomy collection bag with light brown liquid stool to collection bag Genitourinary: Foley in place with yellow urine noted Neurologic: CN 2-12 grossly intact.  Patient lethargic but awakens.  Strength percent with patient having difficulty lifting extremities off the bed estimated overall strength 1-2/5.  Of note patient typically does not like to have his feet even lightly touched due to significant peripheral neuropathy but today he tolerated nursing and structure massaging his feet with Eucerin cream Psychiatric: Awakens, oriented to name.  Remains lethargic   Assessment/Plan: Acute problems: Sepsis with hypotension -Suspect aspiration pneumonia with development of fever, altered mentation, lactic acidosis and significant leukocytosis 28.1 after emesis on 10/18 -Chest x-ray with right upper lobe infiltrate -Hypotension improved somewhat with administration of fluid bolus and 1 unit of packed red blood cells-continue to follow in the event he may  need to transfer to ICU for pressor support -Sed rate greater than 140-blood cultures pending-CRP pending-have ordered follow-up lactate has normalized at 1.3 @@urinalysis  abnormal turbid appearance brown color large amount of hemoglobin moderate leukocytes and positive nitrite and greater than 50 WBC-urine culture pending but suspect UTI-continue empiric Zosyn aspiration pneumonitis and possible UTI@@ -Consulted PCCM   Acute hypoxic respiratory failure s/p tracheostomy due to aspiration pneumonia /Recurrent pansensitive Pseudomonas tracheobronchitis with mucous plugging on 10/7 -Status post bronchoscopy  -Tolerating PMV with SLP following -Above regarding recurrent aspiration pneumonia sepsis physiology -CT abdomen revealed worsening bilateral lower lobe pulmonary infiltrates-May need CT of chest to clarify  Symptomatic anemia/Anemia of critical illness, acute blood loss as well as anemia of chronic disease and iron deficiency:  -Hemoglobin on 10/18 was 9.5 and this morning was 6.4 -Has been given 1 unit PRBCs today with follow-up CBC pending  Acute renal failure on stage IIIb CKD/Presumed ischemic ATN -started CRRT 7/31 > iHD 8/12 s/p left IJ [facility] 8/15.   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  Wife and patient wish to continue aggressive treatment  Grade LA C reflux esophagitis/nonbleeding duodenal ulcers -Continue PPI for total of 4-weeks (initiated  9/29) -Follow-up EGD recommended after discharge  Inability to elevate tongue  -Neurology has documented issues not related to recent stroke or recurrent intubation -SLP also noted during MBS that with attempts at swallowing patient was unable to move muscles and neck and there was no visible movement of hyoid bone on fluoroscopy  Urinary retention -Did not tolerate discontinuation of Foley catheter so reinserted on 10/10 (Foley catheter has been discontinued a total of 3 times this admission with recurrent urinary retention) -We will need eventual follow-up with urology  Dysphagia/protein calorie malnutrition -PEG placed 10/4; off and PEG tube to straight drain given recurrent nausea and vomiting with aspiration  MRI with bilateral punctate lesion (Frontal watershed) -Stroke team differential included thrombotic etiology from either A. 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 -CT without retroperitoneal hematoma but still could have unidentified bleeding-we will discuss with attending about possibly can begin IV heparin with pharmacy dosing since this can be reversed rather quickly if bleeding declares itself   New onset atrial  fibrillation with RVR:  -Currently maintaining sinus rhythm w/o meds -Anticoagulation currently on hold   Seizure disorder:  - Continue keppra changed to IV   Uncontrolled T2DM on OHAs prior to admission with associated neuropathy:  -HbA1c 8.7% at admission. - Continue SSI -Levemir on hold since tube feedings are off -Gabapentin on hold   HTN:  -BP controlled off of Norvasc   HLD:  -Atorvastatin atorvastatin on hold   .    Pressure Injury 02/05/21 Coccyx Lower Stage 2 -  Partial thickness loss of dermis presenting as a shallow open injury with a red, pink wound bed without slough. healing quarter sized spot on sacrum (Active)  Date First Assessed/Time First Assessed: 02/05/21 0800   Location: Coccyx  Location Orientation: Lower  Staging: Stage 2 -  Partial thickness loss of dermis presenting as a shallow open injury with a red, pink wound bed without slough.  Wound Descript...    Assessments 02/05/2021  5:10 PM 02/25/2021  9:05 AM  Dressing Type Foam - Lift dressing to assess site every shift --  Dressing Changed --  State of Healing Early/partial granulation --  Site / Wound Assessment Pink --  % Wound base Red or Granulating 100% --  Wound Length (cm) 2 cm 0 cm  Wound Width (cm) 2 cm 0 cm  Wound Depth (cm) -- 0 cm  Wound Surface Area (cm^2) 4 cm^2 0 cm^2  Wound Volume (cm^3) -- 0 cm^3  Margins Attached edges (approximated) --  Drainage Amount None --  Treatment Cleansed --  No Linked orders to display     Data Reviewed: Basic Metabolic Panel: Recent Labs  Lab 02/19/21 0500 02/20/21 0351 02/21/21 0440 02/22/21 0334 02/24/21 0840 02/25/21 0149  NA 131* 131* 130* 130* 126* 130*  K 3.7 3.7 3.5 4.4 3.7 4.3  CL 94* 95* 92* 94* 87* 94*  CO2 24 25 26 23 25  20*  GLUCOSE 159* 156* 169* 135* 152* 139*  BUN 67* 35* 59* 40* 84* 36*  CREATININE 3.93* 2.88* 3.82* 3.13* 4.11* 2.94*  CALCIUM 9.3 9.1 9.6 9.2 9.2 8.8*  PHOS 4.8* 2.4* 6.0* 3.5 6.3*  --    Liver Function  Tests: Recent Labs  Lab 02/19/21 0500 02/20/21 0351 02/21/21 0440 02/22/21 0334 02/24/21 0840  ALBUMIN 2.1* 2.3* 2.3* 2.6* 2.2*    CBC: Recent Labs  Lab 02/19/21 0500 02/20/21 0351 02/21/21 0440 02/22/21 0334 02/23/21 0811 02/24/21 0840 02/25/21 0159  WBC 11.8* 15.4* 15.8* 24.7* 21.3* 16.6* 28.1*  NEUTROABS 8.0* 10.1* 11.4* 18.8* 15.2*  --   --   HGB 8.7* 9.5* 9.3* 10.4* 11.1* 9.1* 6.4*  HCT 25.7* 27.2* 27.2* 30.7* 31.8* 26.3* 18.4*  MCV 82.9 81.0 81.9 81.6 80.5 79.9* 80.7  PLT 348 391 382 415* 391 363 432*    CBG: Recent Labs  Lab 02/24/21 2028 02/24/21 2330 02/25/21 0355 02/25/21 0805 02/25/21 1205  GLUCAP 148* 149* 137* 104* 103*    Scheduled Meds:  chlorhexidine  15 mL Mouth Rinse BID   Chlorhexidine Gluconate Cloth  6 each Topical Q0600   diclofenac Sodium  4 g Topical QID   hydrocerin   Topical BID   insulin aspart  0-15 Units Subcutaneous Q4H   insulin detemir  10 Units Subcutaneous Daily   mouth rinse  15 mL Mouth Rinse q12n4p   pantoprazole (PROTONIX) IV  40 mg Intravenous Q12H   polyethylene glycol  17 g Per Tube Daily   Continuous Infusions:  sodium chloride 250 mL (02/25/21 1021)   anticoagulant sodium citrate     famotidine (PEPCID) IV 20 mg (02/25/21 1118)   levETIRAcetam 500 mg (02/25/21 1026)   piperacillin-tazobactam (ZOSYN)  IV 2.25 g (02/25/21 0436)   promethazine (PHENERGAN) injection (IM or IVPB) 12.5 mg (02/24/21 1950)   [START ON 02/26/2021] vancomycin     vancomycin 2,000 mg (02/25/21 1123)    Principal Problem:   Diabetes mellitus with hyperosmolarity without hyperglycemic hyperosmolar nonketotic coma (HCC) Active Problems:   Lactic acidosis   Acute renal failure superimposed on stage 2 chronic kidney disease (HCC)   Metabolic acidosis   Tachycardia   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 (HCC)   Adverse effect of chemotherapy   Pressure injury of skin   Atrial fibrillation with RVR (HCC)    Sepsis (HCC)   Malnutrition of moderate degree   Acute metabolic encephalopathy   Acute respiratory failure with hypoxemia (HCC)   Acute renal failure with acute cortical necrosis (HCC)   Shock (HCC)   Status post tracheostomy (HCC)   Endotracheal tube present   Hemodialysis status (HCC)   Palliative care by specialist   SOB (shortness of breath)   Non-intractable vomiting   Abdominal pain, epigastric   Cerebral embolism with cerebral infarction   Acute esophagitis   Duodenal ulcer disease   Consultants: PCCM General surgery Nephrology Hematology/oncology Palliative care medicine team  Procedures: 2u PRBC 7/31, 1u PRBC 8/17, 1u PRBC 8/27 Tracheostomy revision 01/05/2021 Dr. Constance Holster [facility] 8/22 10/7 briefly changed to cuffed trach to allow for short-term mechanical  ventilation and subsequently changed back to cuffless trach on 10/8    Antibiotics: Vancomycin 7/24 Cefepime 7/24 - 7/27 Unasyn 7/30 >> cefepime/flagyl 7/31 - 8/6  Cefazolin x1 dose on 10/ Zosyn 10/5 through 10/9 Vancomycin 10/5 through 10/6    Time spent: 40 minutes    [name] ANP  [facility] 7 am - 330 pm/M-F for direct patient care and secure chat Please refer to [place] for contact info 86  days

## 2021-02-25 NOTE — Progress Notes (Signed)
SLP Cancellation Note  Patient Details Name: Thomas [name] MRN: [id number] DOB: [birth year]   Cancelled treatment:       Reason Eval/Treat Not Completed: Patient not medically ready. No dysphagia tx today given n/v. Will f/u.    Jermel Artley, Katherene Ponto 02/25/2021, 1:56 PM

## 2021-02-25 NOTE — Progress Notes (Addendum)
OT Cancellation Note  Patient Details Name: Thomas [name] MRN: [id number] DOB: [birth year]   Cancelled Treatment:    Reason Eval/Treat Not Completed: Medical issues which prohibited therapy. Per RN patient actively vomiting at shift change this a.m. with coarse lung sounds and persistent cough. CXR with suspicion for possible RUL pneumonia. Patient also hypotensive overnight. OT to hold at this time. Will check back as time allows.   Addendum: Checked back at 1409. RN with request for therapy to hold this date given continued medical issues. OT to check back 10/20.   [name] OTR/L Supplemental OT, Department of rehab services [phone]  Stephany Poorman R H. 02/25/2021, 7:33 AM

## 2021-02-26 ENCOUNTER — Inpatient Hospital Stay (HOSPITAL_COMMUNITY): Payer: Medicare HMO

## 2021-02-26 DIAGNOSIS — E1100 Type 2 diabetes mellitus with hyperosmolarity without nonketotic hyperglycemic-hyperosmolar coma (NKHHC): Secondary | ICD-10-CM | POA: Diagnosis not present

## 2021-02-26 LAB — COMPREHENSIVE METABOLIC PANEL
ALT: 22 U/L (ref 0–44)
AST: 22 U/L (ref 15–41)
Albumin: 2.5 g/dL — ABNORMAL LOW (ref 3.5–5.0)
Alkaline Phosphatase: 97 U/L (ref 38–126)
Anion gap: 20 — ABNORMAL HIGH (ref 5–15)
BUN: 50 mg/dL — ABNORMAL HIGH (ref 8–23)
CO2: 17 mmol/L — ABNORMAL LOW (ref 22–32)
Calcium: 9.3 mg/dL (ref 8.9–10.3)
Chloride: 97 mmol/L — ABNORMAL LOW (ref 98–111)
Creatinine, Ser: 4.02 mg/dL — ABNORMAL HIGH (ref 0.61–1.24)
GFR, Estimated: 16 mL/min — ABNORMAL LOW (ref >60)
Glucose, Bld: 111 mg/dL — ABNORMAL HIGH (ref 70–99)
Potassium: 3.8 mmol/L (ref 3.5–5.1)
Sodium: 134 mmol/L — ABNORMAL LOW (ref 135–145)
Total Bilirubin: 0.9 mg/dL (ref 0.3–1.2)
Total Protein: 8 g/dL (ref 6.5–8.1)

## 2021-02-26 LAB — CBC WITH DIFFERENTIAL/PLATELET
Abs Immature Granulocytes: 0.33 10*3/uL — ABNORMAL HIGH (ref 0.00–0.07)
Basophils Absolute: 0.1 10*3/uL (ref 0.0–0.1)
Basophils Relative: 1 %
Eosinophils Absolute: 0.5 10*3/uL (ref 0.0–0.5)
Eosinophils Relative: 2 %
HCT: 33.5 % — ABNORMAL LOW (ref 39.0–52.0)
Hemoglobin: 11.5 g/dL — ABNORMAL LOW (ref 13.0–17.0)
Immature Granulocytes: 1 %
Lymphocytes Relative: 14 %
Lymphs Abs: 3.2 10*3/uL (ref 0.7–4.0)
MCH: 27.4 pg (ref 26.0–34.0)
MCHC: 34.3 g/dL (ref 30.0–36.0)
MCV: 80 fL (ref 80.0–100.0)
Monocytes Absolute: 1.2 10*3/uL — ABNORMAL HIGH (ref 0.1–1.0)
Monocytes Relative: 5 %
Neutro Abs: 18 10*3/uL — ABNORMAL HIGH (ref 1.7–7.7)
Neutrophils Relative %: 77 %
Platelets: 368 10*3/uL (ref 150–400)
RBC: 4.19 MIL/uL — ABNORMAL LOW (ref 4.22–5.81)
RDW: 14.4 % (ref 11.5–15.5)
WBC: 23.3 10*3/uL — ABNORMAL HIGH (ref 4.0–10.5)
nRBC: 0 % (ref 0.0–0.2)

## 2021-02-26 LAB — GLUCOSE, CAPILLARY
Glucose-Capillary: 102 mg/dL — ABNORMAL HIGH (ref 70–99)
Glucose-Capillary: 113 mg/dL — ABNORMAL HIGH (ref 70–99)
Glucose-Capillary: 125 mg/dL — ABNORMAL HIGH (ref 70–99)
Glucose-Capillary: 154 mg/dL — ABNORMAL HIGH (ref 70–99)

## 2021-02-26 LAB — SEDIMENTATION RATE: Sed Rate: 110 mm/hr — ABNORMAL HIGH (ref 0–16)

## 2021-02-26 LAB — URINE CULTURE: Culture: >=100000 — AB

## 2021-02-26 LAB — LACTIC ACID, PLASMA
Lactic Acid, Venous: 3.1 mmol/L (ref 0.5–1.9)
Lactic Acid, Venous: 2.8 mmol/L (ref 0.5–1.9)

## 2021-02-26 MED ORDER — FLUCONAZOLE IN SODIUM CHLORIDE 200-0.9 MG/100ML-% IV SOLN
200.0000 mg | INTRAVENOUS | Status: DC
  Administered 2021-02-26 – 2021-03-04 (×7): 200 mg via INTRAVENOUS
  Filled 2021-02-26 (×8): qty 100

## 2021-02-26 MED ORDER — GABAPENTIN 250 MG/5ML PO SOLN
100.0000 mg | Freq: Every day | ORAL | Status: DC
  Administered 2021-02-26 – 2021-03-10 (×13): 100 mg
  Filled 2021-02-26 (×13): qty 2

## 2021-02-26 MED ORDER — SODIUM CHLORIDE 0.9 % IV BOLUS
250.0000 mL | Freq: Once | INTRAVENOUS | Status: AC
  Administered 2021-02-26: 250 mL via INTRAVENOUS

## 2021-02-26 MED ORDER — APIXABAN 5 MG PO TABS
5.0000 mg | ORAL_TABLET | Freq: Two times a day (BID) | ORAL | Status: DC
  Administered 2021-02-26 – 2021-03-10 (×26): 5 mg
  Filled 2021-02-26 (×26): qty 1

## 2021-02-26 MED ORDER — LEVETIRACETAM 100 MG/ML PO SOLN
500.0000 mg | Freq: Two times a day (BID) | ORAL | Status: DC
  Administered 2021-02-26 – 2021-03-10 (×25): 500 mg
  Filled 2021-02-26 (×25): qty 5

## 2021-02-26 MED ORDER — ALBUMIN HUMAN 25 % IV SOLN
INTRAVENOUS | Status: AC
  Administered 2021-02-26: 25 g via INTRAVENOUS
  Filled 2021-02-26: qty 100

## 2021-02-26 MED ORDER — ALBUMIN HUMAN 25 % IV SOLN: 25.0000 g | Freq: Once | INTRAVENOUS | Status: AC

## 2021-02-26 MED ORDER — PANTOPRAZOLE 2 MG/ML SUSPENSION
40.0000 mg | Freq: Two times a day (BID) | ORAL | Status: DC
  Administered 2021-02-26 – 2021-02-27 (×2): 40 mg
  Filled 2021-02-26 (×2): qty 20

## 2021-02-26 MED ORDER — QUETIAPINE FUMARATE 25 MG PO TABS
12.5000 mg | ORAL_TABLET | Freq: Every day | ORAL | Status: DC
  Administered 2021-02-26: 12.5 mg
  Filled 2021-02-26: qty 1

## 2021-02-26 MED ORDER — NEPRO/CARBSTEADY PO LIQD
1320.0000 mL | ORAL | Status: DC
  Administered 2021-02-26 – 2021-02-27 (×2): 1320 mL
  Administered 2021-03-01: 1000 mL
  Administered 2021-03-02: 1320 mL
  Filled 2021-02-26 (×5): qty 2000

## 2021-02-26 MED ORDER — OXYCODONE HCL 5 MG/5ML PO SOLN
2.5000 mg | Freq: Four times a day (QID) | ORAL | Status: DC | PRN
  Administered 2021-02-28 – 2021-03-04 (×6): 2.5 mg
  Filled 2021-02-26 (×7): qty 5

## 2021-02-26 NOTE — Progress Notes (Signed)
Patient with heart rate noted to be between 140's and 160's non sustained. Treatment paused Dr. Marval Regal notified. Telephone order given for 25g of IV albumin and to turn uf for remainder of treatment. UF turned off. Orders entered into Epic. Albumin administered.

## 2021-02-26 NOTE — Progress Notes (Signed)
Nutrition Follow-up  DOCUMENTATION CODES:   Non-severe (moderate) malnutrition in context of chronic illness, Obesity unspecified  INTERVENTION:  Continue TF via PEG tube: - Nepro @ 55 ml/hr (1320 ml/day) - ProSource TF 45 ml BID -Free water flushes per MD, currently 149m Q6H  Tube feeding regimen provides 2456 kcal, 129 grams of protein, and 950 ml of H2O (13532mtotal free water)   NUTRITION DIAGNOSIS:   Moderate Malnutrition related to chronic illness, cancer and cancer related treatments as evidenced by mild fat depletion, mild muscle depletion, percent weight loss.  Ongoing, being addressed via TF  GOAL:   Patient will meet greater than or equal to 90% of their needs  Met via TF  MONITOR:   Diet advancement, Labs, Weight trends, TF tolerance, I & O's, Skin  REASON FOR ASSESSMENT:   Consult Enteral/tube feeding initiation and management  ASSESSMENT:   [age].o. male who is a former with medical history of stage 3 colon cancer s/p partial colectomy and end colostomy on 5/4, recent chemo start with 3rd cycle on 7/22, sleep apnea, seizures, epilepsy, brain tumor, HTN, HLD, GERD, asthma, peripheral neuropathy, urinary frequency, DM, MCA stroke. He presented to the ED via EMS due to abdominal pain x2 weeks, anorexia, and weakness. Admitted for close monitoring for concern of mesenteric ischemia. Patient reported that abdominal pain was worse with eating PTA.  07/31 - CRRT initiated 08/09 - CRRT stopped  [facility] - s/p trach 08/12 - Cortrak placed (tip of tube in stomach) [facility] pulled trach out, replaced under bronch guidance, s/p bronch revealing mild area of granulation tissue of top of trach tube 08/25 - pulled trach out, transferred to ICU, emergently intubated 08/29 - trach replaced with ENT in OR 09/04 - tx out of ICU 09/09 - changed to cuffless trach 09/22 - diet advanced to  clears 09/23 - made NPO  09/27 - TF held 2/2 emesis 09/30 - s/p EGD which revealed grade LA C reflux esophagitis/non-bleeding duodenal ulcers 10/03 - Cortrak removed, pt failed MBSS 10/04 - s/p PEG placement 10/05 - pt placed on vent support, transfer to ICU, s/p bronch showing significant mucus plugging 10/06 - back on trach collar 10/07 - trach changed 10/10 - tx back to floor  Discussed pt with RN who reports pt had episode of vomiting and aspiration overnight into 10/19 so TF was held yesterday. Per NP, plan to resume TF today. Please notify RD of any ongoing issues with tolerance. If pt continues to show signs of poor tolerance, will adjust TF regimen.   Current TF orders: Nepro @ 5534mr, 57m83mosource Plus BID, 100ml57me water Q6H.   Pt had HD yesterday with some IDH limiting UF to <1L Admit weight: 111.8 kg Current weight: 100.2 kg  Medications: keppra, SSI q 4 hours,  protonix, miralax, IV abx Labs: Na 134 (L) CBGs: 93-125 x 24 hours  UOP: 150ml 47mhours I/Os: -25.6L since admit  Diet Order:   Diet Order             Diet NPO time specified  Diet effective now                   EDUCATION NEEDS:   No education needs have been identified at this time  Skin:  Skin Assessment: Skin Integrity Issues: Stage II: coccyx  Last BM:  10/19 type 7  Height:   Ht  Readings from Last 1 Encounters:  01/01/21 _0  (1.727 m)    Weight:   Wt Readings from Last 1 Encounters:  02/26/21 100.2 kg    Ideal Body Weight:  70 kg  BMI:  Body mass index is 33.59 kg/m.  Estimated Nutritional Needs:   Kcal:  1735-6701  Protein:  120-140 gm  Fluid:  1 L + UOP    Larkin Ina, MS, RD, LDN (she/her/hers) RD pager number and weekend/on-call pager number located in [place].

## 2021-02-26 NOTE — Progress Notes (Signed)
Patient agitated. Emotional support, reorientation, and repositioning offered. Patient has been treated with prn lorazepam.

## 2021-02-26 NOTE — Progress Notes (Signed)
   02/26/21 0348  Assess: MEWS Score  Temp 99 F (37.2 C)  BP 125/72  Pulse Rate (!) 103  Resp (!) 32  Level of Consciousness Alert  SpO2 100 %  O2 Device Tracheostomy Collar  O2 Flow Rate (L/min) 5 L/min  FiO2 (%) 28 %  Assess: MEWS Score  MEWS Temp 0  MEWS Systolic 0  MEWS Pulse 1  MEWS RR 2  MEWS LOC 0  MEWS Score 3  MEWS Score Color Yellow  Assess: if the MEWS score is Yellow or Red  Were vital signs taken at a resting state? No  Focused Assessment No change from prior assessment  Early Detection of Sepsis Score *See Row Information* Low  MEWS guidelines implemented *See Row Information* No, previously yellow, continue vital signs every 4 hours  Take Vital Signs  Increase Vital Sign Frequency  Yellow: Q 2hr X 2 then Q 4hr X 2, if remains yellow, continue Q 4hrs  Escalate  MEWS: Escalate Yellow: discuss with charge nurse/RN and consider discussing with provider and RRT  Notify: Provider  Provider Name/Title Opyd, MD  Date Provider Notified 02/26/21  Time Provider Notified 0500  Notification Type Page  Notification Reason Other (Comment)  Provider response See new orders  Date of Provider Response 02/26/21  Time of Provider Response 0506  Assess: SIRS CRITERIA  SIRS Temperature  0  SIRS Pulse 1  SIRS Respirations  1  SIRS WBC 0  SIRS Score Sum  2  Pt in distress.

## 2021-02-26 NOTE — Progress Notes (Signed)
[place] and hypotension noted. BP rechecked.

## 2021-02-26 NOTE — Progress Notes (Addendum)
Patient with agitation during HD treatment. Patient unable to be redirected or console. Patient noted to be pulling at lines with mittens on. Agitation was treated with prn lorazepam with no improvement. Treatment terminated approx 15 min early due to agitation.

## 2021-02-26 NOTE — Progress Notes (Addendum)
[facility] PROGRESS NOTE  Thomas [name] ZHY:[id number] DOB: May 29, 1955 DOA: 11/30/2020 PCP: Thomas Mile, NP  Status: Remains inpatient appropriate because: Persistent severe electrolyte disturbances, Unsafe d/c plan, and Inpatient level of care appropriate due to severity of illness  Barriers to discharge:  Currently has tracheostomy in place and is requiring dialysis but remains too weak to tolerate dialysis in chair which is a requirement for outpatient HD  Level of care:  Progressive  Code Status: Full Family Communication: Wife 10/19 updated on change in clinical status and per previous communication several text message update provided throughout the day DVT prophylaxis: Eliquis COVID vaccination status: Moderna 07/08/2019 and 08/11/2019 with first booster dose given 08/05/2020   HPI:  65 y.o. male with a history of T2DM with neuropathy, HTN, HLD., colon cancer s/p resection and colostomy May 2022 s/p 3 cycles chemotherapy (last was July 2022). Followed by Dr. Benay Spice. Started CAPOX June 2022, reduced cepecitabine dose starting 11/19/2020 due to hand/foot syndrome, reduced oxaliplatin due to renal impairment and poor performance status. Presented to the ED 7/24 with abdominal pain found to be febrile, tachycardic, tachypneic. Lactic acid was 6.6 and persisted at 6.8 after sepsis IV fluid bolus and cefepime. He had ARF, demand ischemia, bandemia, was hyperglycemic with negative ketones, with lactic acidosis. He developed AFib with RVR in the ED, was started on diltiazem, and was admitted to the ICU for septic shock with unclear source. Sepsis was subsequently felt to be ruled out at time of admission, presentation felt more likely to be related to cepacitabine toxicity with oral mucositis, hand/foot syndrome, and diarrhea/high output colostomy.   He was transferred to hospitalist service 7/27 but developed metabolic encephalopathy with hyoxia due to aspiration pneumonia on 7/30  requiring intubation and pressor support. Failed extubation with inability to clear secretions, requiring reintubation 8/8, and tracheostomy subsequently placed 8/11. This was pulled out 8/22, replaced under bronch guidance, again removed 8/25 requiring reintubation after failed reinsertion. ENT revised tracheostomy in the OR 8/29. He has required IHD for ARF, and also had complications of RLE DVT now on eliquis, colitis thought to be ischemic 8/2 not requiring specific intervention, Pseudomonas pneumonia having completed treatment, agitated encephalopathy which has improved, anemia requiring 4u PRBCs total, and thrombocytopenia which has resolved. On 9/4 he was transferred to hospitalist service having tolerated trach collar.  Patient was also evaluated by Dr. Benay Spice during this admission clarified that patient was receiving curative chemotherapy prior to admission  On 10/7 patient had an episode of significant hypoxemia due to mucous plugging.  This required brief transfer to the ICU and brief mechanical ventilation until acute bronchoscopy could be completed.  He has had issues with difficult to manage neuropathy that was present prior to admission and caused by Xeloda.  After dialysis on 10/18 patient had nausea and vomiting.  Overnight into 10/19 she had a T-max of 101.6 F axillary which is equivalent to an oral temperature of 102.6.  Sepsis work-up was initiated.  Chest x-ray consistent with pneumonia.  Urinalysis abnormal and concerning for UTI as well.  BP improved after blood and fluids.  Lactate as well had improved but was back up early a.m. 10/20 and was given additional fluids.  Subjective:  Today patient is very alert and talkative.  SLP attempting to work with patient but he is extremely confused having difficulty remaining focused.  His wife is at the bedside.  Patient tells everyone in the room that he was out driving earlier today and he and his  brother were arrested but they have since  been released from jail.  His wife was very amused and is thankful he is alert and talking.  Objective: Vitals:   02/26/21 0348 02/26/21 0452  BP: 125/72   Pulse: (!) 103 (!) 102  Resp: (!) 32 (!) 24  Temp: 99 F (37.2 C) 99 F (37.2 C)  SpO2: 100% 100%    Intake/Output Summary (Last 24 hours) at 02/26/2021 0806 Last data filed at 02/26/2021 0348 Gross per 24 hour  Intake 1337.92 ml  Output 150 ml  Net 1187.92 ml    Filed Weights   02/24/21 1430 02/25/21 0500 02/26/21 0500  Weight: 101.6 kg 100.2 kg 100.2 kg    Exam: Constitutional: No acute distress, alert and talkative Respiratory: #6 XLT cuffless trach, RA to TC, lung sounds remain diminished right upper lobe but left upper lobe clear, diminished in the bases pulse oximetry 100%-PMV in place Cardiovascular: S1-S2, Normotensive.  Extremities warm to touch.  No peripheral edema.  Regular, sinus rhythm Abdomen: Abdomen soft, nontender, PEG site unremarkable, active bowel sounds.  Colostomy collection bag with light brown liquid stool to collection bag Genitourinary: Foley in place with yellow urine noted Neurologic: CN 2-12 grossly intact.  Much more alert today and talkative.  Strength percent with patient having difficulty lifting extremities off the bed estimated overall strength 2-3/5.   Psychiatric: Awakens, oriented to name.  Pleasantly confused   Assessment/Plan: Acute problems: Sepsis with hypotension -Suspect aspiration pneumonia with development of fever, altered mentation, lactic acidosis and significant leukocytosis 28.1 after emesis on 10/18 -Chest x-ray with right upper lobe infiltrate with repeat chest x-ray on 10/20 revealed an RUL infiltrate -Sed rate greater than 140-blood cultures no growth today-CRP 12.9-have ordered follow-up lactate had normalized to 1.3 but early this morning was back up to 3.1 prompting covering physician to order fluid bolus-repeat lactate 2.8. -Urine also consistent with UTI and  urine culture consistent with yeast which is a recurrent infection-begin Diflucan patient requires Foley catheter will administer for 7 to 10 days -Consulted PCCM but no indications to place in ICU as of afternoon of 10/19   Acute hypoxic respiratory failure s/p tracheostomy due to aspiration pneumonia /Recurrent pansensitive Pseudomonas tracheobronchitis with mucous plugging on 10/7 -Status post bronchoscopy  -Tolerating PMV with SLP following -Above regarding recurrent aspiration pneumonia sepsis physiology -CT abdomen revealed worsening bilateral lower lobe pulmonary infiltrates -10/20 chest x-ray demonstrates improvement in RUL pneumonia  Symptomatic anemia/Anemia of critical illness, acute blood loss as well as anemia of chronic disease and iron deficiency:  -Hemoglobin on 10/18 was 9.5 and 10/19 was 6.4 -Has been given 1 unit PRBCs today with follow-up hemoglobin up to 9.9 and today hemoglobin is 11.5  Acute renal failure on stage IIIb CKD/Presumed ischemic ATN -started CRRT 7/31 > iHD 8/12 s/p left IJ [facility] 8/15.   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  Wife and patient wish to continue aggressive treatment  Grade LA C reflux esophagitis/nonbleeding duodenal ulcers -Continue PPI for total of 4-weeks (initiated 9/29) -Follow-up EGD recommended  after discharge  Inability to elevate tongue  -Neurology has documented issues not related to recent stroke or recurrent intubation -SLP also noted during MBS that with attempts at  swallowing patient was unable to move muscles and neck and there was no visible movement of hyoid bone on fluoroscopy  Urinary retention -Did not tolerate discontinuation of Foley catheter so reinserted on 10/10 (Foley catheter has been discontinued a total of 3 times this admission with recurrent urinary retention) -We will need eventual follow-up with urology  Dysphagia/protein calorie malnutrition -PEG placed 10/4 -Due to vomiting and aspiration overnight into 10/19 to feedings placed on hold but will be resumed today on 10/20  MRI with bilateral punctate lesion (Frontal watershed) -Stroke team differential included thrombotic etiology from either A. 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 - Continue SSI -Levemir on hold since tube feedings are off -Gabapentin on hold   HTN:  -BP controlled off of Norvasc   HLD:   -Atorvastatin atorvastatin on hold      Pressure Injury 02/05/21 Coccyx Lower Stage 2 -  Partial thickness loss of dermis presenting as a shallow open injury with a red, pink wound bed without slough. healing quarter sized spot on sacrum (Active)  Date First Assessed/Time First Assessed: 02/05/21 0800   Location: Coccyx  Location Orientation: Lower  Staging: Stage 2 -  Partial thickness loss of dermis presenting as a shallow open injury with a red, pink wound bed without slough.  Wound Descript...    Assessments 02/05/2021  5:10 PM 02/25/2021  7:59 PM  Dressing Type Foam - Lift dressing to assess site every shift Foam - Lift dressing to assess site every shift  Dressing Changed Clean;Dry;Intact  Dressing Change Frequency -- Every 3 days  State of Healing Early/partial granulation Fully granulated  Site / Wound Assessment Pink Clean;Dry  % Wound base Red or Granulating 100% --  Peri-wound Assessment -- Intact  Wound Length (cm) 2 cm --  Wound Width (cm) 2 cm --  Wound Surface Area (cm^2) 4 cm^2 --  Margins Attached edges (approximated) Attached edges (approximated)  Drainage Amount None None  Treatment Cleansed --     No Linked orders to display     Data Reviewed: Basic Metabolic Panel: Recent Labs  Lab 02/20/21 0351 02/21/21 0440 02/22/21 0334 02/24/21 0840 02/25/21 0149 02/25/21 1301 02/26/21 0348  NA 131* 130* 130* 126* 130* 132* 134*  K 3.7 3.5 4.4 3.7 4.3 4.0 3.8  CL 95*  92* 94* 87* 94* 96* 97*  CO2 25 26 23 25  20* 23 17*  GLUCOSE 156* 169* 135* 152* 139* 126* 111*  BUN 35* 59* 40* 84* 36* 41* 50*  CREATININE 2.88* 3.82* 3.13* 4.11* 2.94* 3.36* 4.02*  CALCIUM 9.1 9.6 9.2 9.2 8.8* 8.5* 9.3  PHOS 2.4* 6.0* 3.5 6.3*  --   --   --    Liver Function Tests: Recent Labs  Lab 02/21/21 0440 02/22/21 0334 02/24/21 0840 02/25/21 1301 02/26/21 0348  AST  --   --   --  16 22  ALT  --   --   --  20 22  ALKPHOS  --   --   --  92 97  BILITOT  --   --   --  1.0 0.9  PROT   --   --   --  7.1 8.0  ALBUMIN 2.3* 2.6* 2.2* 2.3* 2.5*    CBC: Recent Labs  Lab 02/20/21 0351 02/21/21 0440 02/22/21 0334 02/23/21 0811 02/24/21 0840 02/25/21 0159 02/25/21 1301 02/26/21 0348  WBC 15.4* 15.8* 24.7* 21.3* 16.6* 28.1* 20.2* 23.3*  NEUTROABS 10.1* 11.4* 18.8* 15.2*  --   --   --  18.0*  HGB 9.5* 9.3* 10.4* 11.1* 9.1* 6.4* 9.9* 11.5*  HCT 27.2* 27.2* 30.7* 31.8* 26.3* 18.4* 28.7* 33.5*  MCV 81.0 81.9 81.6 80.5 79.9* 80.7 81.1 80.0  PLT 391 382 415* 391 363 432* 314 368    CBG: Recent Labs  Lab 02/25/21 1205 02/25/21 1618 02/25/21 1936 02/25/21 2319 02/26/21 0405  GLUCAP 103* 105* 99 93 102*    Scheduled Meds:  chlorhexidine  15 mL Mouth Rinse BID   Chlorhexidine Gluconate Cloth  6 each Topical Q0600   diclofenac Sodium  4 g Topical QID   hydrocerin   Topical BID   insulin aspart  0-15 Units Subcutaneous Q4H   mouth rinse  15 mL Mouth Rinse q12n4p   pantoprazole (PROTONIX) IV  40 mg Intravenous Q12H   polyethylene glycol  17 g Per Tube Daily   Continuous Infusions:  sodium chloride 250 mL (02/25/21 1021)   anticoagulant sodium citrate     famotidine (PEPCID) IV 20 mg (02/25/21 2309)   levETIRAcetam 500 mg (02/25/21 2150)   piperacillin-tazobactam (ZOSYN)  IV 2.25 g (02/26/21 0508)   promethazine (PHENERGAN) injection (IM or IVPB) 12.5 mg (02/24/21 1950)   vancomycin      Principal Problem:   Diabetes mellitus with hyperosmolarity without hyperglycemic hyperosmolar nonketotic coma (HCC) Active Problems:   Lactic acidosis   Acute renal failure superimposed on stage 2 chronic kidney disease (HCC)   Metabolic acidosis   Tachycardia   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 (HCC)   Adverse effect of chemotherapy   Pressure injury of skin   Atrial fibrillation with RVR (HCC)   Sepsis (HCC)   Malnutrition of moderate degree   Acute metabolic encephalopathy   Acute respiratory failure with hypoxemia (HCC)   Acute renal  failure with acute cortical necrosis (HCC)   Shock (HCC)   Status post tracheostomy (HCC)   Endotracheal tube present   Hemodialysis status ([place])   Palliative care by specialist   SOB (shortness of breath)   Non-intractable vomiting   Abdominal pain, epigastric   Cerebral embolism with cerebral infarction   Acute esophagitis   Duodenal ulcer disease   Consultants: PCCM General surgery Nephrology Hematology/oncology Palliative care medicine team  Procedures: 2u PRBC 7/31, 1u PRBC 8/17, 1u PRBC 8/27  Tracheostomy revision 01/05/2021 Dr. Constance Holster [facility] 8/22 10/7 briefly changed to cuffed trach to allow for short-term mechanical ventilation and subsequently changed back to cuffless trach on 10/8    Antibiotics: Vancomycin 7/24 Cefepime 7/24 - 7/27 Unasyn 7/30 >> cefepime/flagyl 7/31 - 8/6  Cefazolin x1 dose on 10/ Zosyn 10/5 through 10/9 Vancomycin 10/5 through 10/6 10/19 Zosyn >>    Time spent: 40 minutes    [name] ANP  [facility] 7 am - 330 pm/M-F for direct patient care and secure chat Please refer to Amion for contact info 87  days

## 2021-02-26 NOTE — Progress Notes (Signed)
Cross-coverage note:   Notified of critical lactic acid. Plan to give fluid bolus, repeat lactate later this morning, continue current antibiotics.

## 2021-02-26 NOTE — Progress Notes (Signed)
Patient agitated. Patient redirected.

## 2021-02-26 NOTE — Progress Notes (Signed)
Occupational Therapy Treatment Patient Details Name: Thomas [name] MRN: [id number] DOB: 1956-02-02 Today's Date: 02/26/2021   History of present illness 65 y.o. male admitted 7/24 with high ostomy output. Pt with severe sepsis and multisystem organ failure. Hospital admission complicated by volume overload, aspiration pneumonia, acute respiratory failure, AKI and ischemic colitis. Intubated 7/30. S/p trach placement 8/11. [facility]Trak 8/12. HD initiated 8/12. To IR 8/15 for tunneled HD cath. Pulled out trach x2 8/22 replaced by PCCM. Pulled out trach again 8/25 but unable to be reinserted with pt intubated. 8/29 to OR for stoma clean up and trach replacement.Trach downsized 9/10. 10/5 respiratory distress with mucous plug requiring bronch, trach change and ventilator 10/5-10/6.  10/6 transitioned to trach collar, 10/7 pulled trach and inserted by RT with no complications. 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   OT comments  Pt supine in bed and highly anxious upon entry, attempting to pull off mitts and requires constant redirection for safety.  RN present and assisted +2 total assist to reposition in bed. Pt reports being at "his uncles house" and re-oriented to place/situation, but able to verbalize month/year independently.  He follows simple commands, but is constantly requiring redirection to task, cueing for safety. Min assist to wash face and complete suction oral care.  Spouse arrived at end of session, pt asking spouse "where is [name]" and becoming increasing agitated until spouse calls her.  Pt with mitts on and bed alarm set upon exit.  Will follow acutely.    Recommendations for follow up therapy are one component of a multi-disciplinary discharge planning process, led by the  attending physician.  Recommendations may be updated based on patient status, additional functional criteria and insurance authorization.    Follow Up Recommendations  SNF;Supervision/Assistance - 24 hour    Equipment Recommendations  Other (comment) (TBD)    Recommendations for Other Services      Precautions / Restrictions Precautions Precautions: Fall Precaution Comments: R subclavian tunneled HD cath, ostomy, peg, trach Restrictions Weight Bearing Restrictions: No       Mobility Bed Mobility               General bed mobility comments: total assist +2 to reposition in bed    Transfers                 General transfer comment: deferred due to anxiety and safety at this time    Balance                                           ADL either performed or assessed with clinical judgement   ADL Overall ADL's : Needs assistance/impaired Eating/Feeding: NPO   Grooming: Minimal assistance;Bed level Grooming Details (indicate cue type and reason): to wash face and complete suction oral care with min assist to attend/complete task                             Functional mobility during ADLs: Total assistance;+2 for physical assistance;+2 for safety/equipment General ADL Comments: remained bed level due to anxiety and safety     Vision   Additional Comments: able to scan room, able to read clock  Perception     Praxis      Cognition Arousal/Alertness: Awake/alert Behavior During Therapy: Restless;Anxious;Impulsive Overall Cognitive Status: Impaired/Different from baseline Area of Impairment: Orientation;Attention;Following commands;Awareness;Problem solving;Safety/judgement;Memory                 Orientation Level: Disoriented to;Place;Situation Current Attention Level: Focused Memory: Decreased short-term memory;Decreased recall of precautions Following Commands: Follows one step commands  inconsistently Safety/Judgement: Decreased awareness of safety;Decreased awareness of deficits Awareness: Intellectual Problem Solving: Slow processing;Difficulty sequencing;Requires verbal cues;Requires tactile cues General Comments: pt highly anxious, pulling at mits throughout session. Re-oriented to place and situation, able to verbalize month and year.  Pt reports he is at his uncles house, asking for his wife.  Spouse present at end of session and reports this is not typicaly him (he is usually laid back).        Exercises     Shoulder Instructions       General Comments VSS on 5L 28% trach collar (TC actually off upn entry with SpO2 99% and RN notified- requested OT to don)    Pertinent Vitals/ Pain       Pain Assessment: Faces Faces Pain Scale: Hurts little more Pain Location: BLE Pain Descriptors / Indicators: Sore Pain Intervention(s): Monitored during session  Home Living                                          Prior Functioning/Environment              Frequency  Min 2X/week        Progress Toward Goals  OT Goals(current goals can now be found in the care plan section)  Progress towards OT goals: Progressing toward goals  Acute Rehab OT Goals Patient Stated Goal: to call my wife OT Goal Formulation: With patient  Plan Discharge plan remains appropriate;Frequency remains appropriate    Co-evaluation                 AM-PAC OT "6 Clicks" Daily Activity     Outcome Measure   Help from another person eating meals?: Total Help from another person taking care of personal grooming?: A Little Help from another person toileting, which includes using toliet, bedpan, or urinal?: Total Help from another person bathing (including washing, rinsing, drying)?: Total Help from another person to put on and taking off regular upper body clothing?: Total Help from another person to put on and taking off regular lower body clothing?:  Total 6 Click Score: 8    End of Session Equipment Utilized During Treatment: Oxygen (via trach collar)  OT Visit Diagnosis: Unsteadiness on feet (R26.81);Muscle weakness (generalized) (M62.81);Pain Pain - part of body: Leg;Ankle and joints of foot   Activity Tolerance Other (comment) (limited by anxiety)   Patient Left in bed;with call bell/phone within reach;with bed alarm set;with family/visitor present   Nurse Communication Mobility status;Other (comment) (anxiety)        Time: 5027-7412 OT Time Calculation (min): 24 min  Charges: OT General Charges $OT Visit: 1 Visit OT Treatments $Self Care/Home Management : 23-37 mins  [place] Pager [phone] Office [phone]   Delight Stare 02/26/2021, 10:03 AM

## 2021-02-26 NOTE — Progress Notes (Signed)
Date and time results received: 02/26/21 0455 (use smartphrase ".now" to insert current time)  Test: lactic acid  Critical Value: 3.1  Name of Provider Notified: Opyd, MD  Orders Received? Or Actions Taken?: Orders Received - See Orders for details and Actions Taken:   Bolus given and will repeat lactic acid in am

## 2021-02-26 NOTE — Progress Notes (Signed)
Speech Language Pathology Treatment: Dysphagia  Patient Details Name: Thomas [name] MRN: [id number] DOB: [birth year] Today's Date: 02/26/2021 Time: 0910-0930 SLP Time Calculation (min) (ACUTE ONLY): 20 min  Assessment / Plan / Recommendation Clinical Impression  Pt alert, somewhat manic behavior with wife at bedside. SLP cleaned up secretions and placed PMSV for clearer speech with wife, tolerated well for 20 minutes. Wife verbalized understanding of PMSV precautions. Pt still mildly dysarthric but clearer after oral care. Pt needed moderate cueing to attend to SLP in distracting environment and in setting of confabulation, hallucinations and rapid speech. Pt able to take sips of water without immediate signs of aspiration. Expect improvements in swallowing if pt remains alert, but a little calmer. Will plan for MBS tomorrow.    HPI HPI: Pt is a 65 y/o male admitted on 7/25 for worsening sepsis vs reaction to chemotherapy with high ostomy output.  Pt developed worsening hypoxia and was requiring BIPAP w/ FIO2 of 100% and increased work of breathing. SLP consulted due to concern for aspiration. BSE 7/28 recommended NPO status and MBS, but this could not completed subsequently due to lethargy. CXR in AM of 7/30 showed some worsening concern of aspiration. ETT 7/30-8/8; tolerated nasal cannula for about an hour; reintubated 8/8-trach 8/11. Pt pulled out trach x2 8/22; replaced by PCCM. Bronch on 8/22 revealed endotracheal granulation tissue at trach site. Trach removed again on 8/25; unable to reinsert despite multiple attenpts; pt reintubated 8/25- trach by ENT on 8/29. BSE 8/22 limited to ice chips due to pt's refusal of other consistencies; rx NPO status with Cortrak.MRI brain 9/30: Multiple punctate foci of restricted diffusion in bilateral  frontal white matter, most likely acute infarcts. EGD 9/30:grade C reflux esophagitis/non-bleeding duodenal ulcers.Pt s/p PEG 10/4.  Trach changed to cuffed and pt  transferred to ICU on 10/5 due to respiratory distress with mucous plug; bronch compled; vent 10/5-10/6. Bronch 10/5: Significant mucous plugging of the LUL with BAL and suctioning up until the subsegmental region. Trach replaced to #[id number] uncuffed. 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      SLP Plan  MBS      Recommendations for follow up therapy are one component of a multi-disciplinary discharge planning process, led by the attending physician.  Recommendations may be updated based on patient status, additional functional criteria and insurance authorization.    Recommendations  Diet recommendations: NPO      Patient may use Passy-Muir Speech Valve: During all waking hours (remove during sleep) PMSV Supervision: Intermittent         Follow up Recommendations: Skilled Nursing facility Plan: MBS       GO                Cristol Engdahl, Katherene Ponto  02/26/2021, 12:17 PM

## 2021-02-26 NOTE — Progress Notes (Signed)
Patient seen and examine.  Lactic acid trending down.  Pressure improved.  Afebrile. Plan  to continue with IV antibiotics.  Back on Eliquis, hemoglobin has remained stable.  Mental status has improved.  See [name] NP note for further details.

## 2021-02-27 ENCOUNTER — Inpatient Hospital Stay (HOSPITAL_COMMUNITY): Payer: Medicare HMO

## 2021-02-27 DIAGNOSIS — E1100 Type 2 diabetes mellitus with hyperosmolarity without nonketotic hyperglycemic-hyperosmolar coma (NKHHC): Secondary | ICD-10-CM | POA: Diagnosis not present

## 2021-02-27 LAB — CBC WITH DIFFERENTIAL/PLATELET
Abs Immature Granulocytes: 0.14 10*3/uL — ABNORMAL HIGH (ref 0.00–0.07)
Basophils Absolute: 0.1 10*3/uL (ref 0.0–0.1)
Basophils Relative: 1 %
Eosinophils Absolute: 0.7 10*3/uL — ABNORMAL HIGH (ref 0.0–0.5)
Eosinophils Relative: 5 %
HCT: 27.2 % — ABNORMAL LOW (ref 39.0–52.0)
Hemoglobin: 9.2 g/dL — ABNORMAL LOW (ref 13.0–17.0)
Immature Granulocytes: 1 %
Lymphocytes Relative: 11 %
Lymphs Abs: 1.5 10*3/uL (ref 0.7–4.0)
MCH: 27.8 pg (ref 26.0–34.0)
MCHC: 33.8 g/dL (ref 30.0–36.0)
MCV: 82.2 fL (ref 80.0–100.0)
Monocytes Absolute: 1.2 10*3/uL — ABNORMAL HIGH (ref 0.1–1.0)
Monocytes Relative: 8 %
Neutro Abs: 10.9 10*3/uL — ABNORMAL HIGH (ref 1.7–7.7)
Neutrophils Relative %: 74 %
Platelets: 319 10*3/uL (ref 150–400)
RBC: 3.31 MIL/uL — ABNORMAL LOW (ref 4.22–5.81)
RDW: 14.3 % (ref 11.5–15.5)
WBC: 14.6 10*3/uL — ABNORMAL HIGH (ref 4.0–10.5)
nRBC: 0 % (ref 0.0–0.2)

## 2021-02-27 LAB — GLUCOSE, CAPILLARY
Glucose-Capillary: 112 mg/dL — ABNORMAL HIGH (ref 70–99)
Glucose-Capillary: 118 mg/dL — ABNORMAL HIGH (ref 70–99)
Glucose-Capillary: 123 mg/dL — ABNORMAL HIGH (ref 70–99)
Glucose-Capillary: 128 mg/dL — ABNORMAL HIGH (ref 70–99)
Glucose-Capillary: 141 mg/dL — ABNORMAL HIGH (ref 70–99)
Glucose-Capillary: 154 mg/dL — ABNORMAL HIGH (ref 70–99)
Glucose-Capillary: 154 mg/dL — ABNORMAL HIGH (ref 70–99)
Glucose-Capillary: 171 mg/dL — ABNORMAL HIGH (ref 70–99)

## 2021-02-27 LAB — RENAL FUNCTION PANEL
Albumin: 2.5 g/dL — ABNORMAL LOW (ref 3.5–5.0)
Anion gap: 11 (ref 5–15)
BUN: 25 mg/dL — ABNORMAL HIGH (ref 8–23)
CO2: 27 mmol/L (ref 22–32)
Calcium: 8.7 mg/dL — ABNORMAL LOW (ref 8.9–10.3)
Chloride: 96 mmol/L — ABNORMAL LOW (ref 98–111)
Creatinine, Ser: 2.93 mg/dL — ABNORMAL HIGH (ref 0.61–1.24)
GFR, Estimated: 23 mL/min — ABNORMAL LOW (ref >60)
Glucose, Bld: 182 mg/dL — ABNORMAL HIGH (ref 70–99)
Phosphorus: 4.1 mg/dL (ref 2.5–4.6)
Potassium: 3.6 mmol/L (ref 3.5–5.1)
Sodium: 134 mmol/L — ABNORMAL LOW (ref 135–145)

## 2021-02-27 LAB — LACTIC ACID, PLASMA: Lactic Acid, Venous: 1.3 mmol/L (ref 0.5–1.9)

## 2021-02-27 LAB — C-REACTIVE PROTEIN: CRP: 9.9 mg/dL — ABNORMAL HIGH (ref <1.0)

## 2021-02-27 LAB — MRSA NEXT GEN BY PCR, NASAL: MRSA by PCR Next Gen: NOT DETECTED

## 2021-02-27 MED ORDER — CLONAZEPAM 0.5 MG PO TABS
0.5000 mg | ORAL_TABLET | Freq: Two times a day (BID) | ORAL | Status: DC
  Administered 2021-02-27 – 2021-03-10 (×24): 0.5 mg
  Filled 2021-02-27 (×24): qty 1

## 2021-02-27 MED ORDER — HALOPERIDOL LACTATE 5 MG/ML IJ SOLN
0.5000 mg | Freq: Four times a day (QID) | INTRAMUSCULAR | Status: DC | PRN
Start: 2021-02-27 — End: 2021-02-27

## 2021-02-27 MED ORDER — VANCOMYCIN HCL IN DEXTROSE 1-5 GM/200ML-% IV SOLN
1000.0000 mg | INTRAVENOUS | Status: AC
  Administered 2021-02-27: 1000 mg via INTRAVENOUS
  Filled 2021-02-27: qty 200

## 2021-02-27 MED ORDER — FREE WATER
200.0000 mL | Freq: Four times a day (QID) | Status: DC
  Administered 2021-02-27 – 2021-03-02 (×12): 200 mL

## 2021-02-27 MED ORDER — HALOPERIDOL LACTATE 5 MG/ML IJ SOLN
0.5000 mg | Freq: Four times a day (QID) | INTRAMUSCULAR | Status: DC | PRN
  Administered 2021-02-27 – 2021-03-01 (×2): 0.5 mg via INTRAVENOUS
  Filled 2021-02-27 (×2): qty 1

## 2021-02-27 MED ORDER — SODIUM CHLORIDE 0.9 % IV SOLN: INTRAVENOUS | Status: DC

## 2021-02-27 MED ORDER — NUTRISOURCE FIBER PO PACK
1.0000 | PACK | Freq: Two times a day (BID) | ORAL | Status: DC
  Administered 2021-02-27 – 2021-03-10 (×24): 1
  Filled 2021-02-27 (×26): qty 1

## 2021-02-27 MED ORDER — PANTOPRAZOLE 2 MG/ML SUSPENSION
40.0000 mg | Freq: Every day | ORAL | Status: DC
  Administered 2021-02-28 – 2021-03-10 (×11): 40 mg
  Filled 2021-02-27 (×11): qty 20

## 2021-02-27 MED ORDER — LOPERAMIDE HCL 1 MG/7.5ML PO SUSP
1.0000 mg | Freq: Every day | ORAL | Status: DC
  Administered 2021-02-27 – 2021-03-10 (×12): 1 mg
  Filled 2021-02-27 (×13): qty 7.5

## 2021-02-27 NOTE — Plan of Care (Signed)
  Problem: Activity: Goal: Risk for activity intolerance will decrease Outcome: Progressing   Problem: Elimination: Goal: Will not experience complications related to bowel motility Outcome: Progressing   Problem: Pain Managment: Goal: General experience of comfort will improve Outcome: Progressing   Problem: Safety: Goal: Ability to remain free from injury will improve Outcome: Progressing   

## 2021-02-27 NOTE — Progress Notes (Signed)
Thomas [name]  Assessment/ Plan:  # Sepsis/aspiration pneumonia with ventilator dependent respiratory failure: Status post tracheostomy, remains on tracheal collar with oxygen supplementation.  Per primary and pulmonary team.  Another aspiration event 10/18, on Vanc/Zosyn.    #. 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  Plan for ongoing HD on TTS schedule.  He is not physically capable for outpatient dialysis at this time and will need significant OT/PT for physical debilitation arising from prolonged hospitalization/underlying illnesses before being able to tolerate outpatient dialysis.  2K, 4h, TDC, Tight Heparin. 1-2L UF SBP> 105  # Anemia: Without overt blood loss and holding ESA in the setting of malignancy.  PRBC transfusion as needed for hemoglobin <8.  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  Calcium level acceptable.  Continue to monitor.    #  Stage IIIb colon cancer: Status post partial colectomy/colostomy and cycle 3 out of 4 of chemotherapy.  His last chemotherapy cycle was limited by physical status/deconditioning and toxicity from medications.  # Hypertension: Blood pressure intermittently low, not on antihypertensive therapy- will try pre HD midodrine   Subjective:  HD yesterday, had IDH/tachycardia/agitation;  limiting UF to 0.8L  Objective Vital signs in last 24 hours: Vitals:   02/27/21 0431 02/27/21 0452 02/27/21 0800 02/27/21 0820  BP:  137/76 124/72   Pulse:  90 90 91  Resp:  18 18   Temp:  98.4 F (36.9 C) 99 F (37.2 C)   TempSrc:  Axillary Axillary   SpO2:  97% 96% 99%  Weight: 101.3 kg     Height:       Weight change: 2.6 kg  Intake/Output Summary (Last 24 hours) at 02/27/2021 1044 Last data filed at 02/27/2021 0654 Gross per 24 hour  Intake 2243.33 ml   Output 994 ml  Net 1249.33 ml        Labs: Basic Metabolic Panel: Recent Labs  Lab 02/22/21 0334 02/24/21 0840 02/25/21 0149 02/25/21 1301 02/26/21 0348 02/27/21 0901  NA 130* 126*   < > 132* 134* 134*  K 4.4 3.7   < > 4.0 3.8 3.6  CL 94* 87*   < > 96* 97* 96*  CO2 23 25   < > 23 17* 27  GLUCOSE 135* 152*   < > 126* 111* 182*  BUN 40* 84*   < > 41* 50* 25*  CREATININE 3.13* 4.11*   < > 3.36* 4.02* 2.93*  CALCIUM 9.2 9.2   < > 8.5* 9.3 8.7*  PHOS 3.5 6.3*  --   --   --  4.1   < > = values in this interval not displayed.    Liver Function Tests: Recent Labs  Lab 02/25/21 1301 02/26/21 0348 02/27/21 0901  AST 16 22  --   ALT 20 22  --   ALKPHOS 92 97  --   BILITOT 1.0 0.9  --   PROT 7.1 8.0  --   ALBUMIN 2.3* 2.5* 2.5*    No results for input(s): LIPASE, AMYLASE in the last 168 hours. No results for input(s): AMMONIA in the last 168 hours. CBC: Recent Labs  Lab 02/23/21 0811 02/24/21 0840 02/25/21 0159 02/25/21 1301 02/26/21 0348 02/27/21 0901  WBC 21.3* 16.6* 28.1* 20.2* 23.3* 14.6*  NEUTROABS 15.2*  --   --   --  18.0*  10.9*  HGB 11.1* 9.1* 6.4* 9.9* 11.5* 9.2*  HCT 31.8* 26.3* 18.4* 28.7* 33.5* 27.2*  MCV 80.5 79.9* 80.7 81.1 80.0 82.2  PLT 391 363 432* 314 368 319    Cardiac Enzymes: No results for input(s): CKTOTAL, CKMB, CKMBINDEX, TROPONINI in the last 168 hours. CBG: Recent Labs  Lab 02/26/21 1923 02/27/21 0006 02/27/21 0024 02/27/21 0425 02/27/21 0807  GLUCAP 154* 123* 112* 171* 154*     Iron Studies: No results for input(s): IRON, TIBC, TRANSFERRIN, FERRITIN in the last 72 hours. Studies/Results: CT ABDOMEN PELVIS WO CONTRAST  Result Date: 02/25/2021 CLINICAL DATA:  Emesis and aspiration. Retroperitoneal hematoma suspected. EXAM: CT ABDOMEN AND PELVIS WITHOUT CONTRAST TECHNIQUE: Multidetector CT imaging of the abdomen and pelvis was performed following the standard protocol without IV contrast. COMPARISON:  Radiography 3 days  ago.  CT 01/30/2021. FINDINGS: Lower chest: Worsening of patchy infiltrates at both lung bases consistent with pneumonia. Hepatobiliary: Liver parenchyma appears normal without contrast. No calcified gallstones. Pancreas: Normal Spleen: Normal Adrenals/Urinary Tract: Adrenal glands are normal. Kidneys are normal except for a 4 cm cyst on the left as seen previously. Foley catheter in the bladder. Stomach/Bowel: Stomach and small intestine are normal except for the presence of a gastrostomy tube. There is been colectomy with right upper quadrant ostomy the does not show complicating features. Distal colon appears unremarkable. Vascular/Lymphatic: Aortic atherosclerosis. No aneurysm. IVC is normal. No retroperitoneal adenopathy. Reproductive: Normal Other: Bilateral inguinal hernias containing fat, left more than right. Musculoskeletal: Chronic degenerative changes of the lumbar spine with apparent multilevel spinal stenosis. IMPRESSION: Worsening bilateral lower lobe pulmonary infiltrates. No acute abdominal or pelvic region finding. No evidence of retroperitoneal hemorrhage. Right upper quadrant ostomy and gastrostomy without complicating features. Aortic Atherosclerosis (ICD10-I70.0). Electronically Signed   By: Nelson Chimes M.D.   On: 02/25/2021 11:35   CT HEAD WO CONTRAST (5MM)  Result Date: 02/25/2021 CLINICAL DATA:  Mental status change. EXAM: CT HEAD WITHOUT CONTRAST TECHNIQUE: Contiguous axial images were obtained from the base of the skull through the vertex without intravenous contrast. COMPARISON:  12/05/2020 FINDINGS: Brain: Stable cerebral atrophy with chronic encephalomalacia in the left frontal lobe. No evidence for acute hemorrhage, mass lesion, midline shift, hydrocephalus or large new infarct. Vascular: No hyperdense vessel or unexpected calcification. Skull: Prior left craniotomy. Sinuses/Orbits: New fluid in the mastoid air cells bilaterally. Small amount of fluid or mucosal thickening in the  left sphenoid sinus. Mild mucosal disease in the bilateral maxillary sinuses. Other: None IMPRESSION: 1. No acute intracranial abnormality. 2. Fluid in the bilateral mastoid air cells. Mild paranasal sinus disease. 3. Stable encephalomalacia in the left frontal lobe and previous left craniotomy. Electronically Signed   By: Markus Daft M.D.   On: 02/25/2021 11:35   DG CHEST PORT 1 VIEW  Result Date: 02/26/2021 CLINICAL DATA:  Tachypnea, pneumonia of both lower lobes EXAM: PORTABLE CHEST - 1 VIEW COMPARISON:  02/24/2021 FINDINGS: Stable tunneled left IJ hemodialysis catheter, right IJ power port, and tracheostomy device. Some improvement of the patchy airspace opacity seen in the right upper lobe. Left lung clear. Heart size and mediastinal contours are within normal limits. No effusion. Visualized bones unremarkable. IMPRESSION: Improving right upper lobe patchy airspace disease Electronically Signed   By: Lucrezia Europe M.D.   On: 02/26/2021 08:18    Medications: Infusions:  sodium chloride 250 mL (02/25/21 1021)   anticoagulant sodium citrate     feeding supplement (NEPRO CARB STEADY) 1,320 mL (02/26/21 1113)   fluconazole (  DIFLUCAN) IV Stopped (02/26/21 1512)   piperacillin-tazobactam (ZOSYN)  IV 2.25 g (02/27/21 0343)   promethazine (PHENERGAN) injection (IM or IVPB) 12.5 mg (02/24/21 1950)   vancomycin     vancomycin      Scheduled Medications:  apixaban  5 mg Per Tube BID   chlorhexidine  15 mL Mouth Rinse BID   Chlorhexidine Gluconate Cloth  6 each Topical Q0600   clonazePAM  0.5 mg Per Tube BID   diclofenac Sodium  4 g Topical QID   free water  200 mL Per Tube Q6H   gabapentin  100 mg Per Tube QHS   hydrocerin   Topical BID   insulin aspart  0-15 Units Subcutaneous Q4H   levETIRAcetam  500 mg Per Tube BID   mouth rinse  15 mL Mouth Rinse q12n4p   pantoprazole sodium  40 mg Per Tube BID   polyethylene glycol  17 g Per Tube Daily    have reviewed scheduled and prn  medications.  Physical Exam: General:NAD, resting calmly Heart:RRR, s1s2 nl Lungs: Coarse breath sound bilaterally, on TC Abdomen:soft, feeding gastric tube and right lower quadrant colostomy present. Extremities:No edema Dialysis Access: Left IJ TDC in place, c/d/I w/o erythema or purulence  Kaleb Linquist B Nicholson Starace 02/27/2021,10:44 AM  LOS: 88 days

## 2021-02-27 NOTE — Progress Notes (Signed)
Pharmacy Antibiotic Note  Thomas [name] is a 65 y.o. male admitted on 11/30/2020 with complex medical history and extended hospital stay for AKI on CKD, sepsis and now concern for aspiration pneumonia.  Pharmacy consulted for vancomycin and Zosyn dosing.  Patient is also on fluconazole for a UTI.  Patient is on HD TTS.  Spoke to HD RN, vancomycin was not given post HD on 10/20.  Afebrile, WBC down to 23.3, LA down to 2.8.  Plan: Vanc 1gm IV qHD TTS - give dose now to make up for 10/20's dose Zosyn 2.25gm IV Q8H Fluconazole 200mg  IV Q24H through 10/29 Monitor HD tolerance, clinical progress, micro data, vanc level as indicated  Height: 5\' 8"  (172.7 cm) Weight: 101.3 kg (223 lb 5.2 oz) IBW/kg (Calculated) : 68.4  Temp (24hrs), Avg:98.6 F (37 C), Min:97.6 F (36.4 C), Max:99.1 F (37.3 C)  Recent Labs  Lab 02/22/21 0334 02/23/21 0811 02/24/21 0840 02/25/21 0149 02/25/21 0159 02/25/21 0454 02/25/21 1301 02/26/21 0348 02/26/21 0847  WBC 24.7* 21.3* 16.6*  --  28.1*  --  20.2* 23.3*  --   CREATININE 3.13*  --  4.11* 2.94*  --   --  3.36* 4.02*  --   LATICACIDVEN  --   --   --   --  3.1* 2.0* 1.3 3.1* 2.8*     Estimated Creatinine Clearance: 21.4 mL/min (A) (by C-G formula based on SCr of 4.02 mg/dL (H)).    Allergies  Allergen Reactions   Morphine And Related Other (See Comments)    Throat swelling    Fluc 9/09 >> 9/15, 9/22>>9/27, 10/20 >> (10/29) Fortaz 9/08>>9/18 Nystatin oral 9/09>>9/22, 9/26 >>10/10 Cefepime 7/24 >> 7/28, 7/31 >> 8/07   Vanc x1 7/25, 8/01 >> 8/02, 10/5>> 10/7; 10/19>> Unasyn 7/30 >> 7/31; 9/19>>9/22 Flagyl 7/31 >> 8/07 Eraxis 8/01 >> 8/02 Cefazolin 8/15 >8/16 Ceftriaxone 8/16> 8/17; x 1 on 9/07 Cefepime 8/17>8/22 Zosyn 10/5>>10/9, 10/19>>   7/31 Trach: Candida tropicalis (likely not pathogenic) 8/15 TA: abundant GPCs, Pseudomonas 9/07 TA: moderate Pseudomonas 9/07 UCx: multiple species 9/19 UCx (cath): yeast 10/5 TA - Pseudomonas - likely  colonizer at this point 10/19 BCx - NGTD 10/19 UCx - yeast 10/20 MRSA PCR -   Arthuro Canelo D. Mina Marble, PharmD, BCPS, [place] 02/27/2021, 9:16 AM

## 2021-02-27 NOTE — Progress Notes (Signed)
[facility] PROGRESS NOTE  Thomas [name] JIR:[id number] DOB: [birth year] DOA: 11/30/2020 PCP: [name], NP  Status: Remains inpatient appropriate because: Persistent severe electrolyte disturbances, Unsafe d/c plan, and Inpatient level of care appropriate due to severity of illness  Barriers to discharge:  Currently has tracheostomy in place and is requiring dialysis but remains too weak to tolerate dialysis in chair which is a requirement for outpatient HD  Level of care:  Progressive  Code Status: Full Family communication: Wife on 10/21 who is at the bedside DVT prophylaxis: Eliquis COVID vaccination status: Moderna 07/08/2019 and 08/11/2019 with first booster dose given 08/05/2020   HPI:  65 y.o. male with a history of T2DM with neuropathy, HTN, HLD., colon cancer s/p resection and colostomy May 2022 s/p 3 cycles chemotherapy (last was July 2022). Followed by Dr. Benay Spice. Started CAPOX June 2022, reduced cepecitabine dose starting 11/19/2020 due to hand/foot syndrome, reduced oxaliplatin due to renal impairment and poor performance status. Presented to the ED 7/24 with abdominal pain found to be febrile, tachycardic, tachypneic. Lactic acid was 6.6 and persisted at 6.8 after sepsis IV fluid bolus and cefepime. He had ARF, demand ischemia, bandemia, was hyperglycemic with negative ketones, with lactic acidosis. He developed AFib with RVR in the ED, was started on diltiazem, and was admitted to the ICU for septic shock with unclear source. Sepsis was subsequently felt to be ruled out at time of admission, presentation felt more likely to be related to cepacitabine toxicity with oral mucositis, hand/foot syndrome, and diarrhea/high output colostomy.   He was transferred to hospitalist service 7/27 but developed metabolic encephalopathy with hyoxia due to aspiration pneumonia on 7/30 requiring intubation and pressor support. Failed extubation with inability to clear secretions, requiring  reintubation 8/8, and tracheostomy subsequently placed 8/11. This was pulled out 8/22, replaced under bronch guidance, again removed 8/25 requiring reintubation after failed reinsertion. ENT revised tracheostomy in the OR 8/29. He has required IHD for ARF, and also had complications of RLE DVT now on eliquis, colitis thought to be ischemic 8/2 not requiring specific intervention, Pseudomonas pneumonia having completed treatment, agitated encephalopathy which has improved, anemia requiring 4u PRBCs total, and thrombocytopenia which has resolved. On 9/4 he was transferred to hospitalist service having tolerated trach collar.  Patient was also evaluated by Dr. Benay Spice during this admission clarified that patient was receiving curative chemotherapy prior to admission  On 10/7 patient had an episode of significant hypoxemia due to mucous plugging.  This required brief transfer to the ICU and brief mechanical ventilation until acute bronchoscopy could be completed.  He has had issues with difficult to manage neuropathy that was present prior to admission and caused by Xeloda.  After dialysis on 10/18 patient had nausea and vomiting.  Overnight into 10/19 she had a T-max of 101.6 F axillary which is equivalent to an oral temperature of 102.6.  Sepsis work-up was initiated.  Chest x-ray consistent with pneumonia.  Urinalysis abnormal and concerning for UTI as well.  BP improved after blood and fluids.  Lactate as well had improved but was back up early a.m. 10/20 and was given additional fluids.  Subjective:  Sleeping soundly at this time.  Wife at room and updated on medication changes instituted today.  Objective: Vitals:   02/27/21 0016 02/27/21 0452  BP: 118/74 137/76  Pulse: 99 90  Resp: 18 18  Temp: 98.6 F (37 C) 98.4 F (36.9 C)  SpO2: 98% 97%    Intake/Output Summary (Last 24 hours) at  02/27/2021 0748 Last data filed at 02/27/2021 0654 Gross per 24 hour  Intake 2243.33 ml  Output 994 ml   Net 1249.33 ml    Filed Weights   02/26/21 0500 02/26/21 1512 02/27/21 0431  Weight: 100.2 kg 102.8 kg 101.3 kg    Exam: Constitutional: No acute distress, sleeping soundly after a night of increased agitation and restlessness Respiratory: #6 XLT cuffless trach, RA to TC, lung sounds clear but coarse bilaterally, diminished in the bases pulse oximetry 100% Cardiovascular: S1-S2, Normotensive.  Extremities warm to touch.  No peripheral edema.  Regular, sinus rhythm Abdomen: Abdomen soft, nontender, PEG site unremarkable, active bowel sounds.  Colostomy collection bag with large volume light brown liquid stool to collection bag Genitourinary: Foley in place with yellow urine noted Neurologic: CN 2-12 grossly intact.  Much more alert today and talkative.  Strength percent with patient having difficulty lifting extremities off the bed estimated overall strength 2-3/5.   Psychiatric: Awakens, oriented to name.  Pleasantly confused   Assessment/Plan: Acute problems: Sepsis with hypotension -Suspect aspiration pneumonia with development of fever, altered mentation, lactic acidosis and significant leukocytosis 28.1 after emesis on 10/18 -Chest x-ray with right upper lobe infiltrate with repeat chest x-ray on 10/20 revealed an RUL infiltrate -Sepsis physiology has resolved noting hypotension more related to dehydration noting it has responded to fluid boluses -CRP and white count trending down-lactic acid also trending down and has now normalized -Recurrent funguria-Diflucan IV -Consulted PCCM but no indications to place in ICU as of afternoon of 10/19  Volume depletion with hypotension -Patient has had 2 episodes of hypotension that responded to volume replacement.  1 while febrile and 1 during dialysis -Patient has been having very large volume liquid stools from colostomy and likely has significant GI losses contributing to -Begin normal saline replacement at 75 cc/h x 48 hours and follow  vital signs and lab -Discontinue any stool softeners or laxatives -Resume scheduled Imodium daily for laxative-consider adding Kaopectate if not contraindicated by ESRD diagnosis -Resume Nutrisource fiber per tube   Acute hypoxic respiratory failure s/p tracheostomy due to aspiration pneumonia /Recurrent pansensitive Pseudomonas tracheobronchitis with mucous plugging on 10/7 -Status post bronchoscopy  -Tolerating PMV with SLP following -Above regarding recurrent aspiration pneumonia sepsis physiology -CT abdomen revealed worsening bilateral lower lobe pulmonary infiltrates -10/20 chest x-ray demonstrates improvement in RUL pneumonia  Symptomatic anemia/Anemia of critical illness, acute blood loss as well as anemia of chronic disease and iron deficiency:  -Hemoglobin on 10/18 was 9.5 and 10/19 was 6.4 -Has been given 1 unit PRBCs today with follow-up hemoglobin up to 9.9 and today hemoglobin is 11.5  Acute renal failure on stage IIIb CKD/Presumed ischemic ATN -started CRRT 7/31 > iHD 8/12 s/p left IJ [facility] 8/15.   -Nephrology does not believe he will regain renal function -Currently too debilitated to tolerate requirements for outpatient HD (transport to and from and dialysis in chair for 3.5 hours+) -10/20 had hypotension during dialysis requiring IV albumin  Acute delirium -Resume Seroquel but at much lower dose of 12.5 mg HS; unfortunately this morning appear to be oversedated and I suspect this is primarily from the Seroquel therefore this has been discontinued -Possibly due to benzodiazepine withdrawal noting all sedating medications discontinued due to lethargy and recent aspiration pneumonitis -Resume Klonopin 0.5 mg but decrease to twice daily -Haldol 0.5 mg IV q 6 hrs prn -follow QTC.  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  Wife and patient wish to continue aggressive treatment  Grade LA C reflux esophagitis/nonbleeding duodenal ulcers -Continue PPI for total of 4-weeks (initiated 9/29) -Follow-up EGD recommended after discharge  Inability to elevate tongue  -Neurology has documented issues not related to recent stroke or recurrent intubation -SLP also noted during MBS that with attempts at swallowing patient was unable to move muscles and neck and there was no visible movement of hyoid bone on fluoroscopy  Urinary retention -Did not tolerate discontinuation of Foley catheter so reinserted on 10/10 (Foley catheter has been discontinued a total of 3 times this admission with recurrent urinary retention) -We will need eventual follow-up with urology  Dysphagia/protein calorie malnutrition -PEG placed 10/4 -Due to vomiting and aspiration overnight into 10/19 to feedings placed on hold but will be resumed today on 10/20  MRI with bilateral punctate lesion (Frontal watershed) -Stroke team differential included thrombotic etiology from either A. 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 - Continue SSI -Levemir on hold since tube feedings are off -Gabapentin on hold   HTN:  -BP controlled off of Norvasc   HLD:  -Atorvastatin atorvastatin on hold      Pressure Injury 02/05/21 Coccyx Lower Stage 2 -  Partial thickness loss of dermis presenting as a shallow open injury with a red, pink wound bed without slough. healing quarter sized spot on sacrum (Active)  Date First Assessed/Time First Assessed: 02/05/21 0800   Location: Coccyx  Location Orientation: Lower  Staging: Stage 2 -  Partial thickness loss of dermis presenting as a shallow open injury with a red, pink wound bed without slough.  Wound Descript...    Assessments 02/05/2021  5:10 PM 02/26/2021  8:14 PM  Dressing Type Foam - Lift dressing to assess site every shift Foam - Lift dressing to assess site every shift  Dressing Changed Clean;Dry;Intact  Dressing Change Frequency -- Every 3 days  State of Healing Early/partial granulation --  Site / Wound Assessment Pink --  % Wound base Red or Granulating 100% --  Wound Length (cm) 2 cm --  Wound Width (cm) 2 cm --  Wound Surface Area (cm^2) 4 cm^2 --  Margins Attached edges (approximated) --  Drainage Amount None --  Treatment Cleansed --     No Linked orders to display  Data Reviewed: Basic Metabolic Panel: Recent Labs  Lab 02/21/21 0440 02/22/21 0334 02/24/21 0840 02/25/21 0149 02/25/21 1301 02/26/21 0348  NA 130* 130* 126* 130* 132* 134*  K 3.5 4.4 3.7 4.3 4.0 3.8  CL 92* 94* 87* 94* 96* 97*  CO2 26 23 25  20* 23 17*  GLUCOSE 169* 135* 152* 139* 126* 111*  BUN 59* 40* 84* 36* 41* 50*  CREATININE 3.82* 3.13* 4.11* 2.94* 3.36* 4.02*   CALCIUM 9.6 9.2 9.2 8.8* 8.5* 9.3  PHOS 6.0* 3.5 6.3*  --   --   --    Liver Function Tests: Recent Labs  Lab 02/21/21 0440 02/22/21 0334 02/24/21 0840 02/25/21 1301 02/26/21 0348  AST  --   --   --  16 22  ALT  --   --   --  20 22  ALKPHOS  --   --   --  92 97  BILITOT  --   --   --  1.0 0.9  PROT  --   --   --  7.1 8.0  ALBUMIN 2.3* 2.6* 2.2* 2.3* 2.5*    CBC: Recent Labs  Lab 02/21/21 0440 02/22/21 0334 02/23/21 0811 02/24/21 0840 02/25/21 0159 02/25/21 1301 02/26/21 0348  WBC 15.8* 24.7* 21.3* 16.6* 28.1* 20.2* 23.3*  NEUTROABS 11.4* 18.8* 15.2*  --   --   --  18.0*  HGB 9.3* 10.4* 11.1* 9.1* 6.4* 9.9* 11.5*  HCT 27.2* 30.7* 31.8* 26.3* 18.4* 28.7* 33.5*  MCV 81.9 81.6 80.5 79.9* 80.7 81.1 80.0  PLT 382 415* 391 363 432* 314 368    CBG: Recent Labs  Lab 02/26/21 1144 02/26/21 1923 02/27/21 0006 02/27/21 0024 02/27/21 0425  GLUCAP 125* 154* 123* 112* 171*    Scheduled Meds:  apixaban  5 mg Per Tube BID   chlorhexidine  15 mL Mouth Rinse BID   Chlorhexidine Gluconate Cloth  6 each Topical Q0600   diclofenac Sodium  4 g Topical QID   free water  200 mL Per Tube Q6H   gabapentin  100 mg Per Tube QHS   hydrocerin   Topical BID   insulin aspart  0-15 Units Subcutaneous Q4H   levETIRAcetam  500 mg Per Tube BID   mouth rinse  15 mL Mouth Rinse q12n4p   pantoprazole sodium  40 mg Per Tube BID   polyethylene glycol  17 g Per Tube Daily   QUEtiapine  12.5 mg Per Tube QHS   Continuous Infusions:  sodium chloride 250 mL (02/25/21 1021)   anticoagulant sodium citrate     feeding supplement (NEPRO CARB STEADY) 1,320 mL (02/26/21 1113)   fluconazole (DIFLUCAN) IV Stopped (02/26/21 1512)   piperacillin-tazobactam (ZOSYN)  IV 2.25 g (02/27/21 0343)   promethazine (PHENERGAN) injection (IM or IVPB) 12.5 mg (02/24/21 1950)   vancomycin      Principal Problem:   Diabetes mellitus with hyperosmolarity without hyperglycemic hyperosmolar nonketotic coma  (HCC) Active Problems:   Lactic acidosis   Acute renal failure superimposed on stage 2 chronic kidney disease (HCC)   Metabolic acidosis   Tachycardia   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 (HCC)   Adverse effect of chemotherapy   Pressure injury of skin   Atrial fibrillation with RVR (HCC)   Sepsis (HCC)   Malnutrition of moderate degree   Acute metabolic encephalopathy   Acute respiratory failure with hypoxemia (HCC)   Acute renal failure with acute cortical necrosis (HCC)   Shock (HCC)  Status post tracheostomy ([place])   Endotracheal tube present   Hemodialysis status ([place])   Palliative care by specialist   SOB (shortness of breath)   Non-intractable vomiting   Abdominal pain, epigastric   Cerebral embolism with cerebral infarction   Acute esophagitis   Duodenal ulcer disease   Consultants: PCCM General surgery Nephrology Hematology/oncology Palliative care medicine team  Procedures: 2u PRBC 7/31, 1u PRBC 8/17, 1u PRBC 8/27 Tracheostomy revision 01/05/2021 Dr. Constance Holster [facility] 8/22 10/7 briefly changed to cuffed trach to allow for short-term mechanical ventilation and subsequently changed back to cuffless trach on 10/8    Antibiotics: Vancomycin 7/24 Cefepime 7/24 - 7/27 Unasyn 7/30 >> cefepime/flagyl 7/31 - 8/6  Cefazolin x1 dose on 10/ Zosyn 10/5 through 10/9 Vancomycin 10/5 through 10/6 10/19 Zosyn >>    Time spent: 40 minutes    [name] ANP  [facility] 7 am - 330 pm/M-F for direct patient care and secure chat Please refer to Amion for contact info 88  days

## 2021-02-27 NOTE — Progress Notes (Signed)
Speech Language Pathology Treatment: Dysphagia  Patient Details Name: Thomas [name] MRN: [id number] DOB: [birth year] Today's Date: 02/27/2021 Time: 7062-3762 SLP Time Calculation (min) (ACUTE ONLY): 11 min  Assessment / Plan / Recommendation Clinical Impression  Visited pt to determine readiness for MBS today. He is much more lethargic. He did make up and keep eyes open with stimuli, but was unable to speak or follow commands. Resistant to oral care and would not orally mainipulate ice. Tongue again thickly coated. Will cancel MBS and reassess next week.   HPI HPI: Pt is a 65 y/o male admitted on 7/25 for worsening sepsis vs reaction to chemotherapy with high ostomy output.  Pt developed worsening hypoxia and was requiring BIPAP w/ FIO2 of 100% and increased work of breathing. SLP consulted due to concern for aspiration. BSE 7/28 recommended NPO status and MBS, but this could not completed subsequently due to lethargy. CXR in AM of 7/30 showed some worsening concern of aspiration. ETT 7/30-8/8; tolerated nasal cannula for about an hour; reintubated 8/8-trach 8/11. Pt pulled out trach x2 8/22; replaced by PCCM. Bronch on 8/22 revealed endotracheal granulation tissue at trach site. Trach removed again on 8/25; unable to reinsert despite multiple attenpts; pt reintubated 8/25- trach by ENT on 8/29. BSE 8/22 limited to ice chips due to pt's refusal of other consistencies; rx NPO status with Cortrak.MRI brain 9/30: Multiple punctate foci of restricted diffusion in bilateral  frontal white matter, most likely acute infarcts. EGD 9/30:grade C reflux esophagitis/non-bleeding duodenal ulcers.Pt s/p PEG 10/4.  Trach changed to cuffed and pt transferred to ICU on 10/5 due to respiratory distress with mucous plug; bronch compled; vent 10/5-10/6. Bronch 10/5: Significant mucous plugging of the LUL with BAL and suctioning up until the subsegmental region. Trach replaced to #[id number] uncuffed. PMH: colon cancer on chemo,  resection of a frontal meningioma 09/01/2016, Transverse colon cancer, stage IIIb (T3N1a), status post a partial transverse colectomy and end colostomy 09/10/2020, DM, recent falls, left MCA CVA, decreased appetite and poor intake, dehydration, seizures, obesity, sleep apnea.      SLP Plan  Continue with current plan of care      Recommendations for follow up therapy are one component of a multi-disciplinary discharge planning process, led by the attending physician.  Recommendations may be updated based on patient status, additional functional criteria and insurance authorization.    Recommendations  Diet recommendations: NPO                Follow up Recommendations: Skilled Nursing facility SLP Visit Diagnosis: Dysphagia, oropharyngeal phase (R13.12) Plan: Continue with current plan of care       GO                Wynetta Seith, Katherene Ponto  02/27/2021, 10:13 AM

## 2021-02-27 NOTE — Progress Notes (Signed)
This chaplain attempted spiritual care F/U with the Pt. wife-Mahalia.  Mahalia is not at the bedside.  The Pt. is participating in PT at the time of the chaplain's visit.   This chaplain is available for F/U spiritual care as needed.  Chaplain Sallyanne Kuster [phone]

## 2021-02-27 NOTE — Progress Notes (Signed)
Physical Therapy Treatment Patient Details Name: Thomas [name] MRN: [id number] DOB: 1956-01-31 Today's Date: 02/27/2021   History of Present Illness 65 y.o. male admitted 7/24 with high ostomy output. Pt with severe sepsis and multisystem organ failure. Hospital admission complicated by volume overload, aspiration pneumonia, acute respiratory failure, AKI and ischemic colitis. Intubated 7/30. S/p trach placement 8/11. CorTrak 8/12. HD initiated 8/12. To IR 8/15 for tunneled HD cath. Pulled out trach x2 8/22 replaced by PCCM. Pulled out trach again 8/25 but unable to be reinserted with pt intubated. 8/29 to OR for stoma clean up and trach replacement.Trach downsized 9/10. 10/5 respiratory distress with mucous plug requiring bronch, trach change and ventilator 10/5-10/6.  10/6 transitioned to trach collar, 10/7 pulled trach and inserted by RT with no complications. 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    PT Comments    Pt limited by fatigue, lethargy and pain this session. Pt becomes more alert with mobility, however reports discomfort with most physical activity this session. Pt requiring increased physical assistance to transfer at this time, but is able to successfully stand twice for 2nd consecutive session which shows improvement compared to previous weeks of therapy. PT continues to recommend aggressive mobilization to improve strength and reduce caregiver burden.   Recommendations for follow up therapy are one component of a multi-disciplinary discharge planning process, led by the attending physician.  Recommendations may be updated based on patient status, additional functional criteria and insurance authorization.  Follow Up Recommendations  SNF     Equipment Recommendations   Wheelchair (measurements PT);Wheelchair cushion (measurements PT);Other (comment);Hospital bed (hoyer lift)    Recommendations for Other Services       Precautions / Restrictions Precautions Precautions: Fall Precaution Comments: R subclavian tunneled HD cath, ostomy, peg, trach Restrictions Weight Bearing Restrictions: No     Mobility  Bed Mobility Overal bed mobility: Needs Assistance Bed Mobility: Supine to Sit;Sit to Supine     Supine to sit: Total assist;+2 for physical assistance;HOB elevated Sit to supine: Total assist        Transfers Overall transfer level: Needs assistance Equipment used: 2 person hand held assist Transfers: Sit to/from Stand Sit to Stand: Max assist;+2 physical assistance;From elevated surface         General transfer comment: pt performs 2 sit to stand transfers with maxA x2, bilateral knee block and use of chuck pad to facilitate hip extension  Ambulation/Gait                 Stairs             Wheelchair Mobility    Modified Rankin (Stroke Patients Only)       Balance Overall balance assessment: Needs assistance Sitting-balance support: Single extremity supported;Bilateral upper extremity supported;Feet supported Sitting balance-Leahy Scale: Poor Sitting balance - Comments: mod-maxA initially progressing to minG-minA by end of session   Standing balance support: Bilateral upper extremity supported Standing balance-Leahy Scale: Zero Standing balance comment: maxA x 2                            Cognition Arousal/Alertness: Awake/alert;Lethargic (lethargic initially, awakens with mobility) Behavior During Therapy: Flat affect Overall Cognitive Status: Impaired/Different from baseline Area of Impairment: Orientation;Attention;Memory;Following commands;Safety/judgement;Awareness;Problem solving  Orientation Level: Disoriented to;Time Current Attention Level: Focused Memory:  Decreased short-term memory Following Commands: Follows one step commands with increased time Safety/Judgement: Decreased awareness of safety;Decreased awareness of deficits Awareness: Intellectual Problem Solving: Slow processing;Requires verbal cues;Requires tactile cues;Decreased initiation        Exercises      General Comments General comments (skin integrity, edema, etc.): VSS on 5L 28% FiO2 trach collar      Pertinent Vitals/Pain Pain Assessment: Faces Faces Pain Scale: Hurts whole lot Pain Location: BLE, back, shoulders Pain Descriptors / Indicators: Moaning Pain Intervention(s): Monitored during session    Home Living                      Prior Function            PT Goals (current goals can now be found in the care plan section) Acute Rehab PT Goals Patient Stated Goal: to walk again Progress towards PT goals: Progressing toward goals (very slowly, able to stand again however needing increased assistance)    Frequency    Min 2X/week      PT Plan Current plan remains appropriate    Co-evaluation              AM-PAC PT "6 Clicks" Mobility   Outcome Measure  Help needed turning from your back to your side while in a flat bed without using bedrails?: A Lot Help needed moving from lying on your back to sitting on the side of a flat bed without using bedrails?: Total Help needed moving to and from a bed to a chair (including a wheelchair)?: Total Help needed standing up from a chair using your arms (e.g., wheelchair or bedside chair)?: Total Help needed to walk in hospital room?: Total Help needed climbing 3-5 steps with a railing? : Total 6 Click Score: 7    End of Session Equipment Utilized During Treatment: Oxygen Activity Tolerance: Patient limited by fatigue;Patient limited by lethargy;Patient limited by pain Patient left: in bed;with call bell/phone within reach;with bed alarm set Nurse Communication: Mobility status;Need for lift  equipment PT Visit Diagnosis: Muscle weakness (generalized) (M62.81);Other abnormalities of gait and mobility (R26.89)     Time: 1420-1446 PT Time Calculation (min) (ACUTE ONLY): 26 min  Charges:  $Therapeutic Activity: 23-37 mins                     Zenaida Niece, PT, DPT Acute Rehabilitation Pager: [phone] Office [facility] 02/27/2021, 3:29 PM

## 2021-02-27 NOTE — Progress Notes (Signed)
Per discussion w/ charge RT and Dr Carlis Abbott w/ CCM, hold off on doing routine trach change today.  MD requested that CCM be contacted for assistance w/ trach change on Monday.

## 2021-02-28 DIAGNOSIS — E1100 Type 2 diabetes mellitus with hyperosmolarity without nonketotic hyperglycemic-hyperosmolar coma (NKHHC): Secondary | ICD-10-CM | POA: Diagnosis not present

## 2021-02-28 LAB — RENAL FUNCTION PANEL
Albumin: 2.1 g/dL — ABNORMAL LOW (ref 3.5–5.0)
Anion gap: 9 (ref 5–15)
BUN: 33 mg/dL — ABNORMAL HIGH (ref 8–23)
CO2: 26 mmol/L (ref 22–32)
Calcium: 8.3 mg/dL — ABNORMAL LOW (ref 8.9–10.3)
Chloride: 98 mmol/L (ref 98–111)
Creatinine, Ser: 3.39 mg/dL — ABNORMAL HIGH (ref 0.61–1.24)
GFR, Estimated: 19 mL/min — ABNORMAL LOW (ref >60)
Glucose, Bld: 101 mg/dL — ABNORMAL HIGH (ref 70–99)
Phosphorus: 4.1 mg/dL (ref 2.5–4.6)
Potassium: 3.4 mmol/L — ABNORMAL LOW (ref 3.5–5.1)
Sodium: 133 mmol/L — ABNORMAL LOW (ref 135–145)

## 2021-02-28 LAB — CBC
HCT: 24.4 % — ABNORMAL LOW (ref 39.0–52.0)
Hemoglobin: 8.1 g/dL — ABNORMAL LOW (ref 13.0–17.0)
MCH: 27.6 pg (ref 26.0–34.0)
MCHC: 33.2 g/dL (ref 30.0–36.0)
MCV: 83.3 fL (ref 80.0–100.0)
Platelets: 285 10*3/uL (ref 150–400)
RBC: 2.93 MIL/uL — ABNORMAL LOW (ref 4.22–5.81)
RDW: 14.2 % (ref 11.5–15.5)
WBC: 12.6 10*3/uL — ABNORMAL HIGH (ref 4.0–10.5)
nRBC: 0 % (ref 0.0–0.2)

## 2021-02-28 LAB — TYPE AND SCREEN
ABO/RH(D): B POS
Antibody Screen: NEGATIVE
Unit division: 0
Unit division: 0

## 2021-02-28 LAB — BPAM RBC
Blood Product Expiration Date: 202210242359
Blood Product Expiration Date: 202211042359
ISSUE DATE / TIME: 202210190544
Unit Type and Rh: 7300
Unit Type and Rh: 7300

## 2021-02-28 LAB — GLUCOSE, CAPILLARY
Glucose-Capillary: 125 mg/dL — ABNORMAL HIGH (ref 70–99)
Glucose-Capillary: 126 mg/dL — ABNORMAL HIGH (ref 70–99)
Glucose-Capillary: 132 mg/dL — ABNORMAL HIGH (ref 70–99)
Glucose-Capillary: 142 mg/dL — ABNORMAL HIGH (ref 70–99)
Glucose-Capillary: 154 mg/dL — ABNORMAL HIGH (ref 70–99)

## 2021-02-28 NOTE — Progress Notes (Addendum)
PROGRESS NOTE    Thomas [name]  YHC:[id number] DOB: 10-Dec-1955 DOA: 11/30/2020 PCP: [name], NP   Brief Narrative: 65 year old with past medical history significant for diabetes type 2, neuropathy, hypertension, hyperlipidemia, colon cancer status postresection and colostomy May 2022 status post 3 cycle chemotherapy, followed by Dr. Nehemiah Massed on CAPOX 2022, dose reduced due to adverse effect hand-foot syndrome.  Presented to ED 7/24 with abdominal pain found to be febrile tachycardia tachypnea lactic acid of 6.  Received IV fluids antibiotics.  He had acute renal failure, demand ischemia, bandemia hyperglycemia.  Developed A. fib with RVR started on Cardizem and admitted to ICU for septic shock of unclear etiology.  Sepsis was subsequently ruled out on admission.  Initial presentation was felt more likely to be related to sepsis Cepacitabine  toxicity with oral mucositis, hand-foot syndrome and diarrhea and high put colostomy. Patient was transferred to the hospitalist service 7/27 but developed metabolic encephalopathy with hypoxia due to aspiration pneumonia on 7/30 requiring intubation and pressor support, failed extubation and inability to clear secretion requiring intubation 80/8 and tracheostomy subsequently placed 80/11. Most recently 10/18 patient spiked fever, chest x-ray consistent with pneumonia, UA concerning for UTI.  Subsequently he stabilized  Assessment & Plan:   Principal Problem:   Diabetes mellitus with hyperosmolarity without hyperglycemic hyperosmolar nonketotic coma ([place]) Active Problems:   Lactic acidosis   Acute renal failure superimposed on stage 2 chronic kidney disease (HCC)   Metabolic acidosis   Tachycardia   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 (HCC)   Adverse effect of chemotherapy   Pressure injury of skin   Atrial fibrillation with RVR (HCC)   Sepsis (HCC)   Malnutrition of moderate degree   Acute metabolic  encephalopathy   Acute respiratory failure with hypoxemia (HCC)   Acute renal failure with acute cortical necrosis (HCC)   Shock ([place])   Status post tracheostomy ([place])   Endotracheal tube present   Hemodialysis status ([place])   Palliative care by specialist   SOB (shortness of breath)   Non-intractable vomiting   Abdominal pain, epigastric   Cerebral embolism with cerebral infarction   Acute esophagitis   Duodenal ulcer disease   1-Sepsis with hypotension: Secondary to pneumonia and UTI: UA with Candida: Continue with Diflucan.  He also has oral candidiasis. Plan To treat for 7 days antibiotic White blood cell trending down.  2-Hypotension secondary to volume depletion secondary to increased output from colostomy Was a started on IV fluids  Started on imodium./  Hold IV fluids. Monitor Out put. PRN bolus  3-Acute Hypoxic Respiratory s/p tracheostomy;  Aspiration PNA, recurrent pseudomonas.  10/20 chest x ray; RUL PNA.  Stable on trach collar.   4-Symptomatic anemia: Received 1 unit of packed red blood cell Hb has remained stable.  5-Acute delirium; improved.  Severe peripheral neuropathy PRN oxycodone.  Stage IIIb colon cancer Protein calorie malnutrition: Continue tube feeding Pressure Injury 02/05/21 Coccyx Lower Stage 2 -  Partial thickness loss of dermis presenting as a shallow open injury with a red, pink wound bed without slough. healing quarter sized spot on sacrum (Active)  02/05/21 0800  Location: Coccyx  Location Orientation: Lower  Staging: Stage 2 -  Partial thickness loss of dermis presenting as a shallow open injury with a red, pink wound bed without slough.  Wound Description (Comments): healing quarter sized spot on sacrum  Present on Admission:      Nutrition Problem: Moderate Malnutrition Etiology: chronic illness,  cancer and cancer related treatments    Signs/Symptoms: mild fat depletion, mild muscle depletion, percent weight loss Percent  weight loss: 11 %    Interventions: Prostat, Tube feeding  Estimated body mass index is 34.53 kg/m as calculated from the following:   Height as of this encounter: 5\' 8"  (1.727 m).   Weight as of this encounter: 103 kg.   DVT prophylaxis: Eliquis Code Status: Code Disposition Plan:  Status is: Inpatient  Remains inpatient appropriate because: Patient with sepsis      Antimicrobials:  Vancomycin Cefepime  Subjective: He is alert, seen in HD>  Denies pain   Objective: Vitals:   02/28/21 1144 02/28/21 1159 02/28/21 1243 02/28/21 1517  BP: (!) 147/75  130/71   Pulse: 88  90 78  Resp: 17  18 (!) 22  Temp: 98.5 F (36.9 C)  98.3 F (36.8 C)   TempSrc: Oral  Axillary   SpO2: 100%  100% 100%  Weight:  103 kg    Height:        Intake/Output Summary (Last 24 hours) at 02/28/2021 1540 Last data filed at 02/28/2021 1144 Gross per 24 hour  Intake 668.01 ml  Output 2175 ml  Net -1506.99 ml   Filed Weights   02/28/21 0356 02/28/21 0800 02/28/21 1159  Weight: 103.9 kg 104.1 kg 103 kg    Examination:  General exam: Appears calm and comfortable  Respiratory system: Clear to auscultation. Respiratory effort normal. Cardiovascular system: S1 & S2 heard, RRR. No JVD, murmurs, rubs, gallops or clicks. No pedal edema. Gastrointestinal system: Abdomen is nondistended, soft and nontender. No organomegaly or masses felt. Normal bowel sounds heard. Central nervous system: Alert and oriented Extremities: Symmetric 5 x 5 power.    Data Reviewed: I have personally reviewed following labs and imaging studies  CBC: Recent Labs  Lab 02/22/21 0334 02/23/21 0811 02/24/21 0840 02/25/21 0159 02/25/21 1301 02/26/21 0348 02/27/21 0901 02/28/21 0730  WBC 24.7* 21.3*   < > 28.1* 20.2* 23.3* 14.6* 12.6*  NEUTROABS 18.8* 15.2*  --   --   --  18.0* 10.9*  --   HGB 10.4* 11.1*   < > 6.4* 9.9* 11.5* 9.2* 8.1*  HCT 30.7* 31.8*   < > 18.4* 28.7* 33.5* 27.2* 24.4*  MCV 81.6 80.5    < > 80.7 81.1 80.0 82.2 83.3  PLT 415* 391   < > 432* 314 368 319 285   < > = values in this interval not displayed.   Basic Metabolic Panel: Recent Labs  Lab 02/22/21 0334 02/24/21 0840 02/25/21 0149 02/25/21 1301 02/26/21 0348 02/27/21 0901 02/28/21 0820  NA 130* 126* 130* 132* 134* 134* 133*  K 4.4 3.7 4.3 4.0 3.8 3.6 3.4*  CL 94* 87* 94* 96* 97* 96* 98  CO2 23 25 20* 23 17* 27 26  GLUCOSE 135* 152* 139* 126* 111* 182* 101*  BUN 40* 84* 36* 41* 50* 25* 33*  CREATININE 3.13* 4.11* 2.94* 3.36* 4.02* 2.93* 3.39*  CALCIUM 9.2 9.2 8.8* 8.5* 9.3 8.7* 8.3*  PHOS 3.5 6.3*  --   --   --  4.1 4.1   GFR: Estimated Creatinine Clearance: 25.6 mL/min (A) (by C-G formula based on SCr of 3.39 mg/dL (H)). Liver Function Tests: Recent Labs  Lab 02/24/21 0840 02/25/21 1301 02/26/21 0348 02/27/21 0901 02/28/21 0820  AST  --  16 22  --   --   ALT  --  20 22  --   --  ALKPHOS  --  92 97  --   --   BILITOT  --  1.0 0.9  --   --   PROT  --  7.1 8.0  --   --   ALBUMIN 2.2* 2.3* 2.5* 2.5* 2.1*   No results for input(s): LIPASE, AMYLASE in the last 168 hours. No results for input(s): AMMONIA in the last 168 hours. Coagulation Profile: No results for input(s): INR, PROTIME in the last 168 hours. Cardiac Enzymes: No results for input(s): CKTOTAL, CKMB, CKMBINDEX, TROPONINI in the last 168 hours. BNP (last 3 results) No results for input(s): PROBNP in the last 8760 hours. HbA1C: No results for input(s): HGBA1C in the last 72 hours. CBG: Recent Labs  Lab 02/27/21 1719 02/27/21 1956 02/27/21 2347 02/28/21 0354 02/28/21 1253  GLUCAP 118* 128* 154* 154* 142*   Lipid Profile: No results for input(s): CHOL, HDL, LDLCALC, TRIG, CHOLHDL, LDLDIRECT in the last 72 hours. Thyroid Function Tests: No results for input(s): TSH, T4TOTAL, FREET4, T3FREE, THYROIDAB in the last 72 hours. Anemia Panel: No results for input(s): VITAMINB12, FOLATE, FERRITIN, TIBC, IRON, RETICCTPCT in the last 72  hours. Sepsis Labs: Recent Labs  Lab 02/25/21 0159 02/25/21 0454 02/25/21 1301 02/26/21 0348 02/26/21 0847 02/27/21 0901  PROCALCITON 0.95  --   --   --   --   --   LATICACIDVEN 3.1*   < > 1.3 3.1* 2.8* 1.3   < > = values in this interval not displayed.    Recent Results (from the past 240 hour(s))  Culture, blood (routine x 2)     Status: None (Preliminary result)   Collection Time: 02/25/21  2:23 AM   Specimen: BLOOD  Result Value Ref Range Status   Specimen Description BLOOD SITE NOT SPECIFIED  Final   Special Requests AEROBIC BOTTLE ONLY Blood Culture adequate volume  Final   Culture   Final    NO GROWTH 3 DAYS Performed at [facility], [address] [place], [place] [address]    Report Status PENDING  Incomplete  Culture, blood (routine x 2)     Status: None (Preliminary result)   Collection Time: 02/25/21  2:23 AM   Specimen: BLOOD  Result Value Ref Range Status   Specimen Description BLOOD SITE NOT SPECIFIED  Final   Special Requests AEROBIC BOTTLE ONLY Blood Culture adequate volume  Final   Culture   Final    NO GROWTH 3 DAYS Performed at [facility], [address], [place], [place] [address]    Report Status PENDING  Incomplete  Urine Culture     Status: Abnormal   Collection Time: 02/25/21  1:01 PM   Specimen: Urine, Catheterized  Result Value Ref Range Status   Specimen Description URINE, CATHETERIZED  Final   Special Requests   Final    NONE Performed at [facility], [place] [address] [place], [place] [address]    Culture >=100,000 COLONIES/mL YEAST (A)  Final   Report Status 02/26/2021 FINAL  Final  MRSA Next Gen by PCR, Nasal     Status: None   Collection Time: 02/27/21  9:01 AM   Specimen: Nasal Mucosa; Nasal Swab  Result Value Ref Range Status   MRSA by PCR Next Gen NOT DETECTED NOT DETECTED Final    Comment: (NOTE) The GeneXpert MRSA Assay (FDA approved for NASAL specimens only), is one component of a comprehensive MRSA  colonization surveillance program. It is not intended to diagnose MRSA  infection nor to guide or monitor treatment for MRSA infections. Test performance is not FDA approved in patients less than 64 years old. Performed at [facility], [place] [address], [place], [place] [address]          Radiology Studies: No results found.      Scheduled Meds:  apixaban  5 mg Per Tube BID   chlorhexidine  15 mL Mouth Rinse BID   Chlorhexidine Gluconate Cloth  6 each Topical Q0600   clonazePAM  0.5 mg Per Tube BID   diclofenac Sodium  4 g Topical QID   fiber  1 packet Per Tube BID   free water  200 mL Per Tube Q6H   gabapentin  100 mg Per Tube QHS   hydrocerin   Topical BID   insulin aspart  0-15 Units Subcutaneous Q4H   levETIRAcetam  500 mg Per Tube BID   loperamide HCl  1 mg Per Tube Daily   mouth rinse  15 mL Mouth Rinse q12n4p   pantoprazole sodium  40 mg Per Tube Daily   Continuous Infusions:  sodium chloride 250 mL (02/25/21 1021)   sodium chloride 75 mL/hr at 02/27/21 1238   anticoagulant sodium citrate     feeding supplement (NEPRO CARB STEADY) 1,320 mL (02/27/21 1722)   fluconazole (DIFLUCAN) IV 200 mg (02/28/21 1512)   piperacillin-tazobactam (ZOSYN)  IV 2.25 g (02/28/21 1331)   promethazine (PHENERGAN) injection (IM or IVPB) 12.5 mg (02/24/21 1950)   vancomycin Stopped (02/28/21 1317)     LOS: 89 days    Time spent: 35 minutes    Kebin Maye A Trinda Harlacher, MD [facility]   If 7PM-7AM, please contact night-coverage [email or web address]  02/28/2021, 3:40 PM

## 2021-02-28 NOTE — Procedures (Signed)
I was present at this dialysis session. I have reviewed the session itself and made appropriate changes.   AML pending. 1L UF goal Started on 2K bath.  TDC Qb 400.    Filed Weights   02/26/21 1512 02/27/21 0431 02/28/21 0356  Weight: 102.8 kg 101.3 kg 103.9 kg    Recent Labs  Lab 02/27/21 0901  NA 134*  K 3.6  CL 96*  CO2 27  GLUCOSE 182*  BUN 25*  CREATININE 2.93*  CALCIUM 8.7*  PHOS 4.1    Recent Labs  Lab 02/23/21 0811 02/24/21 0840 02/25/21 1301 02/26/21 0348 02/27/21 0901  WBC 21.3*   < > 20.2* 23.3* 14.6*  NEUTROABS 15.2*  --   --  18.0* 10.9*  HGB 11.1*   < > 9.9* 11.5* 9.2*  HCT 31.8*   < > 28.7* 33.5* 27.2*  MCV 80.5   < > 81.1 80.0 82.2  PLT 391   < > 314 368 319   < > = values in this interval not displayed.    Scheduled Meds:  apixaban  5 mg Per Tube BID   chlorhexidine  15 mL Mouth Rinse BID   Chlorhexidine Gluconate Cloth  6 each Topical Q0600   clonazePAM  0.5 mg Per Tube BID   diclofenac Sodium  4 g Topical QID   fiber  1 packet Per Tube BID   free water  200 mL Per Tube Q6H   gabapentin  100 mg Per Tube QHS   hydrocerin   Topical BID   insulin aspart  0-15 Units Subcutaneous Q4H   levETIRAcetam  500 mg Per Tube BID   loperamide HCl  1 mg Per Tube Daily   mouth rinse  15 mL Mouth Rinse q12n4p   pantoprazole sodium  40 mg Per Tube Daily   Continuous Infusions:  sodium chloride 250 mL (02/25/21 1021)   sodium chloride 75 mL/hr at 02/27/21 1238   anticoagulant sodium citrate     feeding supplement (NEPRO CARB STEADY) 1,320 mL (02/27/21 1722)   fluconazole (DIFLUCAN) IV 200 mg (02/27/21 1411)   piperacillin-tazobactam (ZOSYN)  IV 2.25 g (02/28/21 0441)   promethazine (PHENERGAN) injection (IM or IVPB) 12.5 mg (02/24/21 1950)   vancomycin     PRN Meds:.sodium chloride, acetaminophen, albuterol, dextrose, fentaNYL (SUBLIMAZE) injection, glycopyrrolate, haloperidol lactate, lip balm, LORazepam, metoprolol tartrate, midodrine, ondansetron,  oxyCODONE, promethazine (PHENERGAN) injection (IM or IVPB), sodium chloride flush   Pearson Grippe  MD 02/28/2021, 8:26 AM

## 2021-03-01 DIAGNOSIS — E1100 Type 2 diabetes mellitus with hyperosmolarity without nonketotic hyperglycemic-hyperosmolar coma (NKHHC): Secondary | ICD-10-CM | POA: Diagnosis not present

## 2021-03-01 LAB — GLUCOSE, CAPILLARY
Glucose-Capillary: 105 mg/dL — ABNORMAL HIGH (ref 70–99)
Glucose-Capillary: 156 mg/dL — ABNORMAL HIGH (ref 70–99)
Glucose-Capillary: 150 mg/dL — ABNORMAL HIGH (ref 70–99)
Glucose-Capillary: 106 mg/dL — ABNORMAL HIGH (ref 70–99)
Glucose-Capillary: 121 mg/dL — ABNORMAL HIGH (ref 70–99)

## 2021-03-01 MED ORDER — NYSTATIN 100000 UNIT/ML MT SUSP
5.0000 mL | Freq: Four times a day (QID) | OROMUCOSAL | Status: AC
  Administered 2021-03-01 – 2021-03-07 (×21): 500000 [IU] via ORAL
  Filled 2021-03-01 (×19): qty 5

## 2021-03-01 MED ORDER — MIDODRINE HCL 5 MG PO TABS
10.0000 mg | ORAL_TABLET | ORAL | Status: DC
  Administered 2021-03-03: 10 mg
  Filled 2021-03-01: qty 2

## 2021-03-01 NOTE — Plan of Care (Signed)
  Problem: Nutrition: Goal: Adequate nutrition will be maintained Outcome: Progressing   Problem: Elimination: Goal: Will not experience complications related to bowel motility Outcome: Progressing   Problem: Safety: Goal: Ability to remain free from injury will improve Outcome: Progressing   

## 2021-03-01 NOTE — Progress Notes (Signed)
[facility] NEPHROLOGY PROGRESS NOTE  Assessment/ Plan:  # Sepsis/aspiration pneumonia with ventilator dependent respiratory failure: Status post tracheostomy, remains on tracheal collar with oxygen supplementation.  Per primary and pulmonary team.  Another aspiration event 10/18, on Vanc/Zosyn.    #. Dialysis dependent acute kidney injury on chronic kidney disease stage IIIb: This is due to ischemic ATN and on dialysis dependent for more than 76-month therefore he is now ESRD.  Plan for ongoing HD on TTS schedule.  He is not physically capable for outpatient dialysis at this time and will need significant OT/PT for physical debilitation arising from prolonged hospitalization/underlying illnesses before being able to tolerate outpatient dialysis.  2K, 4h, TDC, Tight Heparin. 1-2L UF SBP> 105; uses midodrine  # Anemia: Without overt blood loss and holding ESA in the setting of malignancy.  PRBC transfusion as needed for hemoglobin <8.  # CKD-MBD: Phosphorus level is stable off of sevelamer/binders.  Calcium level acceptable.  Continue to monitor.    #  Stage IIIb colon cancer: Status post partial colectomy/colostomy and cycle 3 out of 4 of chemotherapy.  His last chemotherapy cycle was limited by physical status/deconditioning and toxicity from medications.  # Hypertension: Blood pressure intermittently low, not on antihypertensive therapy- will try pre HD midodrine   Subjective:  HD yesterday, 1L UF In good spirits, conversant, denies c/o this AM  Objective Vital signs in last 24 hours: Vitals:   03/01/21 0305 03/01/21 0432 03/01/21 0754 03/01/21 0820  BP:   121/78   Pulse: 91 81  98  Resp: 15   (!) 24  Temp:  98.6 F (37 C)    TempSrc:  Oral    SpO2: 100%   99%  Weight:  106.2 kg    Height:       Weight change: 0.2 kg  Intake/Output Summary (Last 24 hours) at 03/01/2021 1029 Last data filed at 02/28/2021 1600 Gross per 24 hour  Intake 300.41 ml  Output 1600  ml  Net -1299.59 ml        Labs: Basic Metabolic Panel: Recent Labs  Lab 02/24/21 0840 02/25/21 0149 02/26/21 0348 02/27/21 0901 02/28/21 0820  NA 126*   < > 134* 134* 133*  K 3.7   < > 3.8 3.6 3.4*  CL 87*   < > 97* 96* 98  CO2 25   < > 17* 27 26  GLUCOSE 152*   < > 111* 182* 101*  BUN 84*   < > 50* 25* 33*  CREATININE 4.11*   < > 4.02* 2.93* 3.39*  CALCIUM 9.2   < > 9.3 8.7* 8.3*  PHOS 6.3*  --   --  4.1 4.1   < > = values in this interval not displayed.    Liver Function Tests: Recent Labs  Lab 02/25/21 1301 02/26/21 0348 02/27/21 0901 02/28/21 0820  AST 16 22  --   --   ALT 20 22  --   --   ALKPHOS 92 97  --   --   BILITOT 1.0 0.9  --   --   PROT 7.1 8.0  --   --   ALBUMIN 2.3* 2.5* 2.5* 2.1*    No results for input(s): LIPASE, AMYLASE in the last 168 hours. No results for input(s): AMMONIA in the last 168 hours. CBC: Recent Labs  Lab 02/23/21 0811 02/24/21 0840 02/25/21 0159 02/25/21 1301 02/26/21 0348 02/27/21 0901 02/28/21 0730  WBC 21.3*   < > 28.1* 20.2* 23.3* 14.6*  12.6*  NEUTROABS 15.2*  --   --   --  18.0* 10.9*  --   HGB 11.1*   < > 6.4* 9.9* 11.5* 9.2* 8.1*  HCT 31.8*   < > 18.4* 28.7* 33.5* 27.2* 24.4*  MCV 80.5   < > 80.7 81.1 80.0 82.2 83.3  PLT 391   < > 432* 314 368 319 285   < > = values in this interval not displayed.    Cardiac Enzymes: No results for input(s): CKTOTAL, CKMB, CKMBINDEX, TROPONINI in the last 168 hours. CBG: Recent Labs  Lab 02/28/21 1611 02/28/21 2000 02/28/21 2356 03/01/21 0328 03/01/21 0752  GLUCAP 125* 126* 132* 105* 156*     Iron Studies: No results for input(s): IRON, TIBC, TRANSFERRIN, FERRITIN in the last 72 hours. Studies/Results: No results found.  Medications: Infusions:  sodium chloride 250 mL (02/25/21 1021)   anticoagulant sodium citrate     feeding supplement (NEPRO CARB STEADY) 1,000 mL (03/01/21 0331)   fluconazole (DIFLUCAN) IV 200 mg (03/01/21 0920)    piperacillin-tazobactam (ZOSYN)  IV 2.25 g (03/01/21 0410)   promethazine (PHENERGAN) injection (IM or IVPB) 12.5 mg (02/24/21 1950)   vancomycin Stopped (02/28/21 1317)    Scheduled Medications:  apixaban  5 mg Per Tube BID   chlorhexidine  15 mL Mouth Rinse BID   Chlorhexidine Gluconate Cloth  6 each Topical Q0600   clonazePAM  0.5 mg Per Tube BID   diclofenac Sodium  4 g Topical QID   fiber  1 packet Per Tube BID   free water  200 mL Per Tube Q6H   gabapentin  100 mg Per Tube QHS   hydrocerin   Topical BID   insulin aspart  0-15 Units Subcutaneous Q4H   levETIRAcetam  500 mg Per Tube BID   loperamide HCl  1 mg Per Tube Daily   mouth rinse  15 mL Mouth Rinse q12n4p   nystatin  5 mL Oral QID   pantoprazole sodium  40 mg Per Tube Daily    have reviewed scheduled and prn medications.  Physical Exam: General:NAD, Heart:RRR, s1s2 nl Lungs: Coarse breath sound bilaterally, on TC Abdomen:soft, feeding gastric tube and right lower quadrant colostomy present. Extremities:No edema Dialysis Access: Left IJ TDC in place, c/d/I w/o erythema or purulence  Ruweyda Macknight B Doyel Mulkern 03/01/2021,10:29 AM  LOS: 90 days

## 2021-03-01 NOTE — Progress Notes (Signed)
PROGRESS NOTE    Thomas [name]  XAJ:[id number] DOB: 04/13/1956 DOA: 11/30/2020 PCP: [name], NP   Brief Narrative: 65 year old with past medical history significant for diabetes type 2, neuropathy, hypertension, hyperlipidemia, colon cancer status postresection and colostomy May 2022 status post 3 cycle chemotherapy, followed by Dr. Nehemiah Massed on CAPOX 2022, dose reduced due to adverse effect hand-foot syndrome.  Presented to ED 7/24 with abdominal pain found to be febrile tachycardia tachypnea lactic acid of 6.  Received IV fluids antibiotics.  He had acute renal failure, demand ischemia, bandemia hyperglycemia.  Developed A. fib with RVR started on Cardizem and admitted to ICU for septic shock of unclear etiology.  Sepsis was subsequently ruled out on admission.  Initial presentation was felt more likely to be related to sepsis Cepacitabine  toxicity with oral mucositis, hand-foot syndrome and diarrhea and high put colostomy. Patient was transferred to the hospitalist service 7/27 but developed metabolic encephalopathy with hypoxia due to aspiration pneumonia on 7/30 requiring intubation and pressor support, failed extubation and inability to clear secretion requiring intubation 80/8 and tracheostomy subsequently placed 80/11. Most recently 10/18 patient spiked fever, chest x-ray consistent with pneumonia, UA concerning for UTI.  Subsequently he stabilized  Assessment & Plan:   Principal Problem:   Diabetes mellitus with hyperosmolarity without hyperglycemic hyperosmolar nonketotic coma ([place]) Active Problems:   Lactic acidosis   Acute renal failure superimposed on stage 2 chronic kidney disease (HCC)   Metabolic acidosis   Tachycardia   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 (HCC)   Adverse effect of chemotherapy   Pressure injury of skin   Atrial fibrillation with RVR (HCC)   Sepsis (HCC)   Malnutrition of moderate degree   Acute metabolic  encephalopathy   Acute respiratory failure with hypoxemia (HCC)   Acute renal failure with acute cortical necrosis (HCC)   Shock ([place])   Status post tracheostomy ([place])   Endotracheal tube present   Hemodialysis status ([place])   Palliative care by specialist   SOB (shortness of breath)   Non-intractable vomiting   Abdominal pain, epigastric   Cerebral embolism with cerebral infarction   Acute esophagitis   Duodenal ulcer disease   1-Sepsis with hypotension: Secondary to pneumonia and UTI: UA with Candida: Continue with Diflucan.  He also has oral candidiasis. Plan To treat for 7 days antibiotic White blood cell trending down. Repeat labs tomorrow.   2-Hypotension secondary to volume depletion secondary to increased output from colostomy Was a started on IV fluids  Started on imodium./  Hold IV fluids. Monitor Out put. PRN bolus Out put decreased.   3-Acute Hypoxic Respiratory s/p tracheostomy;  Aspiration PNA, recurrent pseudomonas.  10/20 chest x ray; RUL PNA.  Stable on trach collar.   4-Symptomatic anemia: Received 1 unit of packed red blood cell Hb has remained stable.  5-Acute delirium; improved.  Severe peripheral neuropathy PRN oxycodone.  Stage IIIb colon cancer Protein calorie malnutrition: Continue tube feeding Pressure Injury 02/05/21 Coccyx Lower Stage 2 -  Partial thickness loss of dermis presenting as a shallow open injury with a red, pink wound bed without slough. healing quarter sized spot on sacrum (Active)  02/05/21 0800  Location: Coccyx  Location Orientation: Lower  Staging: Stage 2 -  Partial thickness loss of dermis presenting as a shallow open injury with a red, pink wound bed without slough.  Wound Description (Comments): healing quarter sized spot on sacrum  Present on Admission:  Nutrition Problem: Moderate Malnutrition Etiology: chronic illness, cancer and cancer related treatments    Signs/Symptoms: mild fat depletion, mild muscle  depletion, percent weight loss Percent weight loss: 11 %    Interventions: Prostat, Tube feeding  Estimated body mass index is 35.6 kg/m as calculated from the following:   Height as of this encounter: 5\' 8"  (1.727 m).   Weight as of this encounter: 106.2 kg.   DVT prophylaxis: Eliquis Code Status: Code Disposition Plan:  Status is: Inpatient  Remains inpatient appropriate because: Patient with sepsis      Antimicrobials:  Vancomycin Cefepime  Subjective: He is alert, denies pain,  Trach in place.   Objective: Vitals:   03/01/21 1148 03/01/21 1207 03/01/21 1544 03/01/21 1626  BP:      Pulse: 89 89 85 74  Resp: (!) 22  (!) 22   Temp:  98 F (36.7 C)    TempSrc:  Oral    SpO2: 98%  99%   Weight:      Height:       No intake or output data in the 24 hours ending 03/01/21 1651  Filed Weights   02/28/21 0800 02/28/21 1159 03/01/21 0432  Weight: 104.1 kg 103 kg 106.2 kg    Examination:  General exam: NAD, trach in place Respiratory system: BL ronchus Cardiovascular system: S 1, S 2 RRR Gastrointestinal system: BS present, soft, nt Central nervous system: Alert,  Extremities: no edema    Data Reviewed: I have personally reviewed following labs and imaging studies  CBC: Recent Labs  Lab 02/23/21 0811 02/24/21 0840 02/25/21 0159 02/25/21 1301 02/26/21 0348 02/27/21 0901 02/28/21 0730  WBC 21.3*   < > 28.1* 20.2* 23.3* 14.6* 12.6*  NEUTROABS 15.2*  --   --   --  18.0* 10.9*  --   HGB 11.1*   < > 6.4* 9.9* 11.5* 9.2* 8.1*  HCT 31.8*   < > 18.4* 28.7* 33.5* 27.2* 24.4*  MCV 80.5   < > 80.7 81.1 80.0 82.2 83.3  PLT 391   < > 432* 314 368 319 285   < > = values in this interval not displayed.    Basic Metabolic Panel: Recent Labs  Lab 02/24/21 0840 02/25/21 0149 02/25/21 1301 02/26/21 0348 02/27/21 0901 02/28/21 0820  NA 126* 130* 132* 134* 134* 133*  K 3.7 4.3 4.0 3.8 3.6 3.4*  CL 87* 94* 96* 97* 96* 98  CO2 25 20* 23 17* 27 26   GLUCOSE 152* 139* 126* 111* 182* 101*  BUN 84* 36* 41* 50* 25* 33*  CREATININE 4.11* 2.94* 3.36* 4.02* 2.93* 3.39*  CALCIUM 9.2 8.8* 8.5* 9.3 8.7* 8.3*  PHOS 6.3*  --   --   --  4.1 4.1    GFR: Estimated Creatinine Clearance: 26 mL/min (A) (by C-G formula based on SCr of 3.39 mg/dL (H)). Liver Function Tests: Recent Labs  Lab 02/24/21 0840 02/25/21 1301 02/26/21 0348 02/27/21 0901 02/28/21 0820  AST  --  16 22  --   --   ALT  --  20 22  --   --   ALKPHOS  --  92 97  --   --   BILITOT  --  1.0 0.9  --   --   PROT  --  7.1 8.0  --   --   ALBUMIN 2.2* 2.3* 2.5* 2.5* 2.1*    No results for input(s): LIPASE, AMYLASE in the last 168 hours. No results for input(s): AMMONIA  in the last 168 hours. Coagulation Profile: No results for input(s): INR, PROTIME in the last 168 hours. Cardiac Enzymes: No results for input(s): CKTOTAL, CKMB, CKMBINDEX, TROPONINI in the last 168 hours. BNP (last 3 results) No results for input(s): PROBNP in the last 8760 hours. HbA1C: No results for input(s): HGBA1C in the last 72 hours. CBG: Recent Labs  Lab 02/28/21 2356 03/01/21 0328 03/01/21 0752 03/01/21 1205 03/01/21 1624  GLUCAP 132* 105* 156* 150* 106*    Lipid Profile: No results for input(s): CHOL, HDL, LDLCALC, TRIG, CHOLHDL, LDLDIRECT in the last 72 hours. Thyroid Function Tests: No results for input(s): TSH, T4TOTAL, FREET4, T3FREE, THYROIDAB in the last 72 hours. Anemia Panel: No results for input(s): VITAMINB12, FOLATE, FERRITIN, TIBC, IRON, RETICCTPCT in the last 72 hours. Sepsis Labs: Recent Labs  Lab 02/25/21 0159 02/25/21 0454 02/25/21 1301 02/26/21 0348 02/26/21 0847 02/27/21 0901  PROCALCITON 0.95  --   --   --   --   --   LATICACIDVEN 3.1*   < > 1.3 3.1* 2.8* 1.3   < > = values in this interval not displayed.     Recent Results (from the past 240 hour(s))  Culture, blood (routine x 2)     Status: None (Preliminary result)   Collection Time: 02/25/21  2:23 AM    Specimen: BLOOD  Result Value Ref Range Status   Specimen Description BLOOD SITE NOT SPECIFIED  Final   Special Requests AEROBIC BOTTLE ONLY Blood Culture adequate volume  Final   Culture   Final    NO GROWTH 4 DAYS Performed at [facility], [address] [place], [place] [address]    Report Status PENDING  Incomplete  Culture, blood (routine x 2)     Status: None (Preliminary result)   Collection Time: 02/25/21  2:23 AM   Specimen: BLOOD  Result Value Ref Range Status   Specimen Description BLOOD SITE NOT SPECIFIED  Final   Special Requests AEROBIC BOTTLE ONLY Blood Culture adequate volume  Final   Culture   Final    NO GROWTH 4 DAYS Performed at [facility], [address] [place], [place] [address]    Report Status PENDING  Incomplete  Urine Culture     Status: Abnormal   Collection Time: 02/25/21  1:01 PM   Specimen: Urine, Catheterized  Result Value Ref Range Status   Specimen Description URINE, CATHETERIZED  Final   Special Requests   Final    NONE Performed at [facility], [place] [address] [place], [place] [address]    Culture >=100,000 COLONIES/mL YEAST (A)  Final   Report Status 02/26/2021 FINAL  Final  MRSA Next Gen by PCR, Nasal     Status: None   Collection Time: 02/27/21  9:01 AM   Specimen: Nasal Mucosa; Nasal Swab  Result Value Ref Range Status   MRSA by PCR Next Gen NOT DETECTED NOT DETECTED Final    Comment: (NOTE) The GeneXpert MRSA Assay (FDA approved for NASAL specimens only), is one component of a comprehensive MRSA colonization surveillance program. It is not intended to diagnose MRSA infection nor to guide or monitor treatment for MRSA infections. Test performance is not FDA approved in patients less than 57 years old. Performed at [facility], [place] [address], [place], [place] [address]          Radiology Studies: No results found.      Scheduled Meds:  apixaban  5 mg  Per Tube BID   chlorhexidine  15  mL Mouth Rinse BID   Chlorhexidine Gluconate Cloth  6 each Topical Q0600   clonazePAM  0.5 mg Per Tube BID   diclofenac Sodium  4 g Topical QID   fiber  1 packet Per Tube BID   free water  200 mL Per Tube Q6H   gabapentin  100 mg Per Tube QHS   hydrocerin   Topical BID   insulin aspart  0-15 Units Subcutaneous Q4H   levETIRAcetam  500 mg Per Tube BID   loperamide HCl  1 mg Per Tube Daily   mouth rinse  15 mL Mouth Rinse q12n4p   nystatin  5 mL Oral QID   pantoprazole sodium  40 mg Per Tube Daily   Continuous Infusions:  sodium chloride 250 mL (02/25/21 1021)   anticoagulant sodium citrate     feeding supplement (NEPRO CARB STEADY) 1,000 mL (03/01/21 0331)   fluconazole (DIFLUCAN) IV 200 mg (03/01/21 0920)   piperacillin-tazobactam (ZOSYN)  IV 2.25 g (03/01/21 1349)   promethazine (PHENERGAN) injection (IM or IVPB) 12.5 mg (02/24/21 1950)   vancomycin Stopped (02/28/21 1317)     LOS: 90 days    Time spent: 35 minutes    Dakisha Schoof A Farzana Koci, MD [facility]   If 7PM-7AM, please contact night-coverage [email or web address]  03/01/2021, 4:51 PM

## 2021-03-01 NOTE — Progress Notes (Signed)
Speech Language Pathology Treatment: Dysphagia;Nada Boozer Speaking valve;Cognitive-Linquistic  Patient Details Name: Thomas [name] MRN: [id number] DOB: [birth year] Today's Date: 03/01/2021 Time: 1120-1140 SLP Time Calculation (min) (ACUTE ONLY): 20 min  Assessment / Plan / Recommendation Clinical Impression  Pt fully alert today, SLP placed PMSV and pt able to particiapte in conversation level speech, identifying family members and discussing them at length with about 3 instances of self correction of mild dysarthria. Pt aware, says, "I cant talk." Lingual surface much improved but base of tongue still coated and mildly weak. Pt able to take sips of water and bites of puree followed by sips. No signs of aspiration but oral phase with puree still prolonged. Will f/u with MBS tomorrow if mentation stable.   HPI HPI: Pt is a 65 y/o male admitted on 7/25 for worsening sepsis vs reaction to chemotherapy with high ostomy output.  Pt developed worsening hypoxia and was requiring BIPAP w/ FIO2 of 100% and increased work of breathing. SLP consulted due to concern for aspiration. BSE 7/28 recommended NPO status and MBS, but this could not completed subsequently due to lethargy. CXR in AM of 7/30 showed some worsening concern of aspiration. ETT 7/30-8/8; tolerated nasal cannula for about an hour; reintubated 8/8-trach 8/11. Pt pulled out trach x2 8/22; replaced by PCCM. Bronch on 8/22 revealed endotracheal granulation tissue at trach site. Trach removed again on 8/25; unable to reinsert despite multiple attenpts; pt reintubated 8/25- trach by ENT on 8/29. BSE 8/22 limited to ice chips due to pt's refusal of other consistencies; rx NPO status with Cortrak.MRI brain 9/30: Multiple punctate foci of restricted diffusion in bilateral  frontal white matter, most likely acute infarcts. EGD 9/30:grade C reflux esophagitis/non-bleeding duodenal ulcers.Pt s/p PEG 10/4.  Trach changed to cuffed and pt transferred to ICU on 10/5  due to respiratory distress with mucous plug; bronch compled; vent 10/5-10/6. Bronch 10/5: Significant mucous plugging of the LUL with BAL and suctioning up until the subsegmental region. Trach replaced to #[id number] uncuffed. 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      SLP Plan  MBS      Recommendations for follow up therapy are one component of a multi-disciplinary discharge planning process, led by the attending physician.  Recommendations may be updated based on patient status, additional functional criteria and insurance authorization.    Recommendations         Patient may use Passy-Muir Speech Valve: During all waking hours (remove during sleep) PMSV Supervision: Intermittent         Oral Care Recommendations: Oral care BID Plan: MBS       GO                Laneya Gasaway, Katherene Ponto  03/01/2021, 12:49 PM

## 2021-03-02 ENCOUNTER — Inpatient Hospital Stay (HOSPITAL_COMMUNITY): Payer: Medicare HMO

## 2021-03-02 DIAGNOSIS — E1100 Type 2 diabetes mellitus with hyperosmolarity without nonketotic hyperglycemic-hyperosmolar coma (NKHHC): Secondary | ICD-10-CM | POA: Diagnosis not present

## 2021-03-02 LAB — GLUCOSE, CAPILLARY
Glucose-Capillary: 111 mg/dL — ABNORMAL HIGH (ref 70–99)
Glucose-Capillary: 117 mg/dL — ABNORMAL HIGH (ref 70–99)
Glucose-Capillary: 130 mg/dL — ABNORMAL HIGH (ref 70–99)
Glucose-Capillary: 131 mg/dL — ABNORMAL HIGH (ref 70–99)
Glucose-Capillary: 143 mg/dL — ABNORMAL HIGH (ref 70–99)

## 2021-03-02 LAB — BASIC METABOLIC PANEL
Anion gap: 11 (ref 5–15)
BUN: 26 mg/dL — ABNORMAL HIGH (ref 8–23)
CO2: 26 mmol/L (ref 22–32)
Calcium: 8.9 mg/dL (ref 8.9–10.3)
Chloride: 97 mmol/L — ABNORMAL LOW (ref 98–111)
Creatinine, Ser: 3.37 mg/dL — ABNORMAL HIGH (ref 0.61–1.24)
GFR, Estimated: 20 mL/min — ABNORMAL LOW (ref >60)
Glucose, Bld: 160 mg/dL — ABNORMAL HIGH (ref 70–99)
Potassium: 3.6 mmol/L (ref 3.5–5.1)
Sodium: 134 mmol/L — ABNORMAL LOW (ref 135–145)

## 2021-03-02 LAB — CBC
HCT: 29.2 % — ABNORMAL LOW (ref 39.0–52.0)
Hemoglobin: 9.6 g/dL — ABNORMAL LOW (ref 13.0–17.0)
MCH: 27.4 pg (ref 26.0–34.0)
MCHC: 32.9 g/dL (ref 30.0–36.0)
MCV: 83.2 fL (ref 80.0–100.0)
Platelets: 316 10*3/uL (ref 150–400)
RBC: 3.51 MIL/uL — ABNORMAL LOW (ref 4.22–5.81)
RDW: 14.3 % (ref 11.5–15.5)
WBC: 13.2 10*3/uL — ABNORMAL HIGH (ref 4.0–10.5)
nRBC: 0 % (ref 0.0–0.2)

## 2021-03-02 LAB — CULTURE, BLOOD (ROUTINE X 2)
Culture: NO GROWTH
Culture: NO GROWTH
Special Requests: ADEQUATE
Special Requests: ADEQUATE

## 2021-03-02 MED ORDER — ATORVASTATIN CALCIUM 10 MG PO TABS
20.0000 mg | ORAL_TABLET | Freq: Every day | ORAL | Status: DC
  Administered 2021-03-02 – 2021-03-12 (×11): 20 mg via ORAL
  Filled 2021-03-02 (×12): qty 2

## 2021-03-02 MED ORDER — FREE WATER
200.0000 mL | Freq: Three times a day (TID) | Status: DC
  Administered 2021-03-02 – 2021-03-03 (×3): 200 mL

## 2021-03-02 MED ORDER — CHLORHEXIDINE GLUCONATE CLOTH 2 % EX PADS
6.0000 | MEDICATED_PAD | Freq: Every day | CUTANEOUS | Status: DC
  Administered 2021-03-02 – 2021-03-13 (×11): 6 via TOPICAL

## 2021-03-02 NOTE — Progress Notes (Signed)
Down getting MBS Will see 10/25  [name] ACNP-BC [place] Pager # [phone] OR # [phone] if no answer

## 2021-03-02 NOTE — Progress Notes (Signed)
[facility] PROGRESS NOTE  Thomas [name] QQP:[id number] DOB: [birth year] DOA: 11/30/2020 PCP: [name], NP  Status: Remains inpatient appropriate because: Persistent severe electrolyte disturbances, Unsafe d/c plan, and Inpatient level of care appropriate due to severity of illness  Barriers to discharge:  Currently has tracheostomy in place and is requiring dialysis but remains too weak to tolerate dialysis in chair which is a requirement for outpatient HD  Level of care:  Progressive  Code Status: Full Family communication: Wife on 10/21 who is at the bedside DVT prophylaxis: Eliquis COVID vaccination status: Moderna 07/08/2019 and 08/11/2019 with first booster dose given 08/05/2020   HPI:  65 y.o. male with a history of T2DM with neuropathy, HTN, HLD., colon cancer s/p resection and colostomy May 2022 s/p 3 cycles chemotherapy (last was July 2022). Followed by Dr. Benay Spice. Started CAPOX June 2022, reduced cepecitabine dose starting 11/19/2020 due to hand/foot syndrome, reduced oxaliplatin due to renal impairment and poor performance status. Presented to the ED 7/24 with abdominal pain found to be febrile, tachycardic, tachypneic. Lactic acid was 6.6 and persisted at 6.8 after sepsis IV fluid bolus and cefepime. He had ARF, demand ischemia, bandemia, was hyperglycemic with negative ketones, with lactic acidosis. He developed AFib with RVR in the ED, was started on diltiazem, and was admitted to the ICU for septic shock with unclear source. Sepsis was subsequently felt to be ruled out at time of admission, presentation felt more likely to be related to cepacitabine toxicity with oral mucositis, hand/foot syndrome, and diarrhea/high output colostomy.   He was transferred to hospitalist service 7/27 but developed metabolic encephalopathy with hyoxia due to aspiration pneumonia on 7/30 requiring intubation and pressor support. Failed extubation with inability to clear secretions, requiring  reintubation 8/8, and tracheostomy subsequently placed 8/11. This was pulled out 8/22, replaced under bronch guidance, again removed 8/25 requiring reintubation after failed reinsertion. ENT revised tracheostomy in the OR 8/29. He has required IHD for ARF, and also had complications of RLE DVT now on eliquis, colitis thought to be ischemic 8/2 not requiring specific intervention, Pseudomonas pneumonia having completed treatment, agitated encephalopathy which has improved, anemia requiring 4u PRBCs total, and thrombocytopenia which has resolved. On 9/4 he was transferred to hospitalist service having tolerated trach collar.  Patient was also evaluated by Dr. Benay Spice during this admission clarified that patient was receiving curative chemotherapy prior to admission  On 10/7 patient had an episode of significant hypoxemia due to mucous plugging.  This required brief transfer to the ICU and brief mechanical ventilation until acute bronchoscopy could be completed.  He has had issues with difficult to manage neuropathy that was present prior to admission and caused by Xeloda.  On 10/18 patient developed fever and hypoxemia secondary to aspiration pneumonitis post emesis immediately following dialysis.  Urine culture also positive for recurrent yeast noting patient requires Foley catheter due to recurrent urinary retention.  Subjective:  Awaken from sleep.  He briefly talked with me but remains confused.  Objective: Vitals:   03/02/21 0317 03/02/21 0428  BP:  129/73  Pulse: 90 86  Resp: 16 15  Temp:  98.2 F (36.8 C)  SpO2: 100% 100%   No intake or output data in the 24 hours ending 03/02/21 0746   Filed Weights   02/28/21 1159 03/01/21 0432 03/02/21 0431  Weight: 103 kg 106.2 kg 105.9 kg    Exam: Constitutional: No acute distress, awakened from sleep.  Became slightly frustrated when he became aware his wife had left  the room. Respiratory: #6 XLT cuffless trach, RA to TC, lung sounds clear but  coarse bilaterally, oximetry 97 to 100%, no increased work of breathing Cardiac: S1-S2, regular pulse without tachycardia, no peripheral edema, skin warm and dry, normotensive Abdomen: Abdomen soft, nontender, PEG site unremarkable, normoactive bowel sounds.  Colostomy collection bag with light brown liquid stool to collection bag Genitourinary: Foley in place with yellow urine noted Neurologic: CN 2-12 grossly intact.  Awakens.  When asked confirm that was tired from recent trip to radiology's.  Strength of both upper and lower extremities estimated 2-3/5.   Psychiatric: Awakens, oriented to name.  Pleasantly confused   Assessment/Plan: Acute problems: Sepsis with hypotension -Suspect aspiration pneumonia sewed of postdialysis emesis -Sepsis physiology has resolved noting hypotension more related to dehydration noting it has responded to fluid boluses -Sepsis physiology has resolved -Recurrent funguria-Diflucan IV  Volume depletion with hypotension -Patient has been having very large volume liquid stools from colostomy and likely has significant GI losses contributing to low blood pressure which has now normalized after 48 hours of IV fluids -Continue scheduled Imodium daily  -Consider adding Kaopectate if not contraindicated by ESRD diagnosis -Continue Nutrisource fiber per tube   Acute hypoxic respiratory failure s/p tracheostomy due to aspiration pneumonia /Recurrent pansensitive Pseudomonas tracheobronchitis with mucous plugging on 10/7 -Status post bronchoscopy  -Tolerating PMV with SLP following -Recent episode of aspiration pneumonitis effectively treated and has completed 6 days of Zosyn  Symptomatic anemia/Anemia of critical illness, acute blood loss as well as anemia of chronic disease and iron deficiency:  -He has received a total of 5 units PRBCs since admission with current hemoglobin stable at  Acute renal failure on stage IIIb CKD/Presumed ischemic ATN -started CRRT  7/31 > iHD 8/12 s/p left IJ [facility] 8/15.   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  Wife and patient wish to continue aggressive treatment  Grade LA C reflux esophagitis/nonbleeding duodenal ulcers -Continue PPI for total of 4-weeks (initiated 9/29) -Follow-up EGD recommended after discharge  Inability to elevate tongue  -Neurology has documented issues not related to recent stroke or recurrent intubation -SLP also noted during MBS that with attempts at swallowing patient was unable to move muscles and neck and there was no visible movement of hyoid bone on fluoroscopy -10/24 improving and follow-up MBS has been completed with results pending  Urinary retention -Did not tolerate discontinuation of Foley catheter so reinserted on 10/10 (Foley catheter has been discontinued a total of 3 times this admission with recurrent urinary retention) -Will need eventual follow-up with urology  Dysphagia/protein calorie malnutrition -PEG placed 10/4 -Continue tube feedings -MBS completed on 10/24  MRI with bilateral punctate lesion (Frontal watershed) -Stroke team differential included thrombotic etiology from either A. 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 -CBGs well controlled with SSI alone.  Continue to hold Levemir for now -Gabapentin resumed   HTN:  -BP controlled off of Norvasc   HLD:  -Resume atorvastatin      Pressure Injury 02/05/21 Coccyx Lower Stage 2 -  Partial thickness loss of dermis presenting as a shallow open injury with a red, pink wound bed without slough. healing quarter sized spot on sacrum (Active)  Date First Assessed/Time First Assessed: 02/05/21 0800   Location: Coccyx  Location Orientation: Lower  Staging: Stage 2 -  Partial thickness loss of dermis presenting as a shallow open injury with a red, pink wound bed without slough.  Wound Descript...    Assessments 02/05/2021  5:10 PM 02/27/2021  7:58 PM  Dressing Type Foam - Lift dressing to assess site every shift Foam - Lift dressing to assess site every shift  Dressing Changed Clean;Dry;Intact  Dressing Change Frequency -- Every 3 days  State of Healing Early/partial granulation Fully granulated  Site / Wound Assessment Pink Clean;Dry  % Wound base Red or Granulating 100% 100%  Wound Length (cm) 2 cm --  Wound Width (cm) 2 cm --  Wound Surface Area (cm^2) 4 cm^2 --  Margins Attached edges (approximated) Attached edges (approximated)   Drainage Amount None None  Treatment Cleansed --     No Linked orders to display     Data Reviewed: Basic Metabolic Panel: Recent Labs  Lab 02/24/21 0840 02/25/21 0149 02/25/21 1301 02/26/21 0348 02/27/21 0901 02/28/21 0820  NA 126* 130* 132* 134* 134* 133*  K 3.7 4.3 4.0 3.8 3.6 3.4*  CL 87* 94* 96* 97* 96* 98  CO2 25 20* 23 17* 27 26  GLUCOSE 152* 139* 126* 111* 182* 101*  BUN 84* 36* 41* 50* 25* 33*  CREATININE 4.11* 2.94* 3.36* 4.02* 2.93* 3.39*  CALCIUM 9.2 8.8* 8.5* 9.3 8.7* 8.3*  PHOS 6.3*  --   --   --  4.1 4.1   Liver Function Tests: Recent Labs  Lab 02/24/21 0840 02/25/21 1301 02/26/21 0348 02/27/21 0901 02/28/21 0820  AST  --  16 22  --   --   ALT  --  20 22  --   --   ALKPHOS  --  92 97  --   --   BILITOT  --  1.0 0.9  --   --   PROT  --  7.1 8.0  --   --   ALBUMIN 2.2* 2.3* 2.5* 2.5* 2.1*    CBC: Recent Labs  Lab 02/23/21 0811 02/24/21 0840 02/25/21 0159 02/25/21 1301 02/26/21 0348 02/27/21 0901 02/28/21 0730  WBC 21.3*   < > 28.1* 20.2* 23.3* 14.6* 12.6*  NEUTROABS 15.2*  --   --   --  18.0* 10.9*  --   HGB 11.1*   < > 6.4* 9.9* 11.5* 9.2* 8.1*  HCT 31.8*   < > 18.4* 28.7* 33.5* 27.2* 24.4*  MCV 80.5   < > 80.7 81.1 80.0 82.2 83.3  PLT 391   < > 432* 314 368 319 285   < > = values in this interval not displayed.    CBG: Recent Labs  Lab 03/01/21 1205 03/01/21 1624 03/01/21 1958 03/02/21 0006 03/02/21 0422  GLUCAP 150* 106* 121* 130* 117*    Scheduled Meds:  apixaban  5 mg Per Tube BID   chlorhexidine  15 mL Mouth Rinse BID   Chlorhexidine Gluconate Cloth  6 each Topical Q0600   clonazePAM  0.5 mg Per Tube BID   diclofenac Sodium  4 g Topical QID   fiber  1 packet Per Tube BID   free water  200 mL Per Tube Q6H   gabapentin  100 mg Per Tube QHS   hydrocerin   Topical BID   insulin aspart  0-15 Units Subcutaneous Q4H   levETIRAcetam  500 mg Per Tube BID   loperamide HCl  1 mg Per Tube Daily   mouth rinse  15 mL Mouth  Rinse q12n4p   [START ON 03/03/2021] midodrine  10 mg Per Tube Q T,Th,Sa-HD   nystatin  5 mL Oral QID   pantoprazole sodium  40 mg Per Tube Daily   Continuous Infusions:  sodium chloride 250 mL (02/25/21 1021)   anticoagulant sodium citrate     feeding supplement (NEPRO CARB STEADY) 1,000 mL (03/01/21 0331)   fluconazole (DIFLUCAN) IV 200 mg (03/01/21 0920)   piperacillin-tazobactam (ZOSYN)  IV 2.25 g (03/02/21 0426)   promethazine (PHENERGAN) injection (IM or IVPB) 12.5 mg (02/24/21 1950)   vancomycin Stopped (02/28/21 1317)    Principal Problem:   Diabetes mellitus with hyperosmolarity without hyperglycemic hyperosmolar nonketotic coma (HCC) Active Problems:   Lactic acidosis   Acute renal failure superimposed on stage 2 chronic kidney disease (HCC)   Metabolic acidosis   Tachycardia   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 (HCC)   Adverse effect of chemotherapy   Pressure injury of skin   Atrial fibrillation with RVR (HCC)   Sepsis (HCC)   Malnutrition of moderate degree   Acute metabolic encephalopathy   Acute respiratory failure with hypoxemia (HCC)   Acute renal failure with acute cortical necrosis (HCC)   Shock ([place])   Status post tracheostomy ([place])   Endotracheal tube present   Hemodialysis status ([place])   Palliative care by specialist   SOB (shortness of breath)   Non-intractable vomiting   Abdominal pain, epigastric   Cerebral embolism with cerebral infarction   Acute esophagitis   Duodenal ulcer disease   Consultants: PCCM General surgery Nephrology Hematology/oncology Palliative care medicine team  Procedures: 2u PRBC 7/31, 1u PRBC 8/17, 1u PRBC 8/27 Tracheostomy revision 01/05/2021 Dr. Constance Holster [facility] 8/22 10/7 briefly changed to cuffed trach to allow for short-term mechanical ventilation and subsequently changed back to cuffless trach on 10/8    Antibiotics: Vancomycin 7/24 Cefepime 7/24 - 7/27 Unasyn 7/30 >> cefepime/flagyl  7/31 - 8/6  Cefazolin x1 dose on 10/ Zosyn 10/5 through 10/9 Vancomycin 10/5 through 10/6 10/19 Zosyn >>    Time spent: 40 minutes    [name] ANP  [facility] 7 am - 330 pm/M-F for direct patient care and secure chat Please refer to Amion for contact info 91  days

## 2021-03-02 NOTE — Progress Notes (Signed)
Physical Therapy Treatment Patient Details Name: Thomas [name] MRN: [id number] DOB: October 08, 1955 Today's Date: 03/02/2021   History of Present Illness 65 y.o. male admitted 7/24 with high ostomy output. Pt with severe sepsis and multisystem organ failure. Hospital admission complicated by volume overload, aspiration pneumonia, acute respiratory failure, AKI and ischemic colitis. Intubated 7/30. S/p trach placement 8/11. [facility]Trak 8/12. HD initiated 8/12. To IR 8/15 for tunneled HD cath. Pulled out trach x2 8/22 replaced by PCCM. Pulled out trach again 8/25 but unable to be reinserted with pt intubated. 8/29 to OR for stoma clean up and trach replacement.Trach downsized 9/10. 10/5 respiratory distress with mucous plug requiring bronch, trach change and ventilator 10/5-10/6.  10/6 transitioned to trach collar, 10/7 pulled trach and inserted by RT with no complications. 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    PT Comments    Pt tolerates treatment well, tolerating multiple transfer attempts and with more consistent command following. Pt does report fatigue but is much more alert than previous session. Pt continues to demonstrate significant LE weakness but is able to progress to transfer training with RW. Pt's standing tolerance remains limited by back pain. PT is hopeful that increased standing and out of bed mobility will aide in improving back pain also. PT continues to recommend SNF placement at this time.   Recommendations for follow up therapy are one component of a multi-disciplinary discharge planning process, led by the attending physician.  Recommendations may be updated based on patient status, additional functional criteria and insurance authorization.  Follow Up Recommendations   Skilled nursing-short term rehab (<3 hours/day)     Assistance Recommended at Discharge Intermittent Supervision/Assistance  Equipment Recommendations  Wheelchair (measurements PT);Hospital bed (hoyer lift)    Recommendations for Other Services       Precautions / Restrictions Precautions Precautions: Fall Precaution Comments: R subclavian tunneled HD cath, ostomy, peg, trach Restrictions Weight Bearing Restrictions: No     Mobility  Bed Mobility Overal bed mobility: Needs Assistance Bed Mobility: Supine to Sit;Sit to Supine;Rolling Rolling: Mod assist   Supine to sit: Max assist;HOB elevated Sit to supine: Mod assist;HOB elevated        Transfers Overall transfer level: Needs assistance Equipment used: Rolling walker (2 wheels);2 person hand held assist Transfers: Sit to/from Stand Sit to Stand: Max assist;+2 physical assistance           [name] transfer comment: pt with improved initiation of sit to stand with stand attempts 2, 3 and 4. Pt with increased assistance needed for first and 5th stand attempts. PT providing verbal cueing and tactile cueing for hip extension to improve posture. Standing tolerance limited due to back pain    Ambulation/Gait                 Stairs             Wheelchair Mobility    Modified Rankin (Stroke Patients Only)       Balance Overall balance assessment: Needs assistance Sitting-balance support: No upper extremity supported;Feet supported Sitting balance-Leahy Scale: Poor Sitting balance - Comments: minG-minA at edge of bed   Standing balance support: Bilateral upper extremity supported Standing balance-Leahy Scale: Poor Standing balance comment: modA x2 with BUE support of walker or hand hold  Cognition Arousal/Alertness: Awake/alert Behavior During Therapy: Flat affect Overall Cognitive Status: Impaired/Different from baseline Area of Impairment:  Orientation;Memory;Following commands;Safety/judgement;Awareness;Problem solving                 Orientation Level: Disoriented to;Time Current Attention Level: Sustained Memory: Decreased short-term memory Following Commands: Follows one step commands with increased time Safety/Judgement: Decreased awareness of deficits Awareness: Intellectual Problem Solving: Slow processing          Exercises      [name] Comments [name] comments (skin integrity, edema, etc.): VSS on 5L 28% FiO2, PMV donned      Pertinent Vitals/Pain Pain Assessment: Faces Faces Pain Scale: Hurts even more Pain Location: back Pain Descriptors / Indicators: Moaning Pain Intervention(s): Monitored during session    Home Living                          Prior Function            PT Goals (current goals can now be found in the care plan section) Acute Rehab PT Goals Patient Stated Goal: to walk again Progress towards PT goals: Progressing toward goals    Frequency    Min 2X/week      PT Plan Current plan remains appropriate    Co-evaluation              AM-PAC PT "6 Clicks" Mobility   Outcome Measure  Help needed turning from your back to your side while in a flat bed without using bedrails?: A Lot Help needed moving from lying on your back to sitting on the side of a flat bed without using bedrails?: A Lot Help needed moving to and from a bed to a chair (including a wheelchair)?: Total Help needed standing up from a chair using your arms (e.g., wheelchair or bedside chair)?: Total Help needed to walk in hospital room?: Total Help needed climbing 3-5 steps with a railing? : Total 6 Click Score: 8    End of Session Equipment Utilized During Treatment: Oxygen Activity Tolerance: Patient tolerated treatment well Patient left: in bed;with call bell/phone within reach;with bed alarm set (RN requesting pt remain in bed due to frequent bowel movements this date) Nurse  Communication: Mobility status;Need for lift equipment PT Visit Diagnosis: Muscle weakness (generalized) (M62.81);Other abnormalities of gait and mobility (R26.89)     Time: 0165-5374 PT Time Calculation (min) (ACUTE ONLY): 33 min  Charges:  $Therapeutic Activity: 23-37 mins                     Zenaida Niece, PT, DPT Acute Rehabilitation Pager: [phone] Office [place] 03/02/2021, 1:58 PM

## 2021-03-02 NOTE — Progress Notes (Signed)
Subjective:  No new events-  denies complaint today -  remains on trach collar Objective Vital signs in last 24 hours: Vitals:   03/02/21 0428 03/02/21 0431 03/02/21 0752 03/02/21 0833  BP: 129/73  107/69   Pulse: 86  73 81  Resp: 15  11 14   Temp: 98.2 F (36.8 C)  98.3 F (36.8 C)   TempSrc: Axillary  Axillary   SpO2: 100%  99% 99%  Weight:  105.9 kg    Height:       Weight change: 1.8 kg No intake or output data in the 24 hours ending 03/02/21 0852  Assessment/ Plan: Pt is a 65 y.o. yo male with DM, HTN, colon CA and CKD who was admitted on 7/67/3419 with complications of chemo then sepsis/MSOF  Assessment/Plan: 1. Sepsis/asp PNA-  overall improved-  still on zosyn and vanc 2. ESRD -  has progressed to ESRD this admit-  has TDC-  dialyzing TTS-  dispo is difficult due to trach and physical deconditioning.  Plan on next HD tomorrow-  3 K  3. Anemia- hgb dropping of late-  will add ESA and check hgb with HD-  no iron as is still on abx-  will check iron stores tomorrow as well.  Not using heparin-  using citrate in cath ?  See hep ab wnl and normal platelets-  could we do heparin 4. Secondary hyperparathyroidism-  calc Maren Reamer OK- no binder-  will check PTH and act accordingly  5. HTN/volume-  no signif periph edema-  BP soft,  on midodrine-  on free water with low sodium-  will dec frequency  6. Dispo-  difficult due to ESRD and trach  7. Trach-  on trach collar-  dont know if plans being considered to downsize trach  8. Colon CA-  complicating factor  [name]    Labs: Basic Metabolic Panel: Recent Labs  Lab 02/24/21 0840 02/25/21 0149 02/26/21 0348 02/27/21 0901 02/28/21 0820  NA 126*   < > 134* 134* 133*  K 3.7   < > 3.8 3.6 3.4*  CL 87*   < > 97* 96* 98  CO2 25   < > 17* 27 26  GLUCOSE 152*   < > 111* 182* 101*  BUN 84*   < > 50* 25* 33*  CREATININE 4.11*   < > 4.02* 2.93* 3.39*  CALCIUM 9.2   < > 9.3 8.7* 8.3*  PHOS 6.3*  --   --  4.1 4.1   < > =  values in this interval not displayed.   Liver Function Tests: Recent Labs  Lab 02/25/21 1301 02/26/21 0348 02/27/21 0901 02/28/21 0820  AST 16 22  --   --   ALT 20 22  --   --   ALKPHOS 92 97  --   --   BILITOT 1.0 0.9  --   --   PROT 7.1 8.0  --   --   ALBUMIN 2.3* 2.5* 2.5* 2.1*   No results for input(s): LIPASE, AMYLASE in the last 168 hours. No results for input(s): AMMONIA in the last 168 hours. CBC: Recent Labs  Lab 02/25/21 0159 02/25/21 1301 02/26/21 0348 02/27/21 0901 02/28/21 0730  WBC 28.1* 20.2* 23.3* 14.6* 12.6*  NEUTROABS  --   --  18.0* 10.9*  --   HGB 6.4* 9.9* 11.5* 9.2* 8.1*  HCT 18.4* 28.7* 33.5* 27.2* 24.4*  MCV 80.7 81.1 80.0 82.2 83.3  PLT 432* 314 368 319 285  Cardiac Enzymes: No results for input(s): CKTOTAL, CKMB, CKMBINDEX, TROPONINI in the last 168 hours. CBG: Recent Labs  Lab 03/01/21 1624 03/01/21 1958 03/02/21 0006 03/02/21 0422 03/02/21 0745  GLUCAP 106* 121* 130* 117* 143*    Iron Studies: No results for input(s): IRON, TIBC, TRANSFERRIN, FERRITIN in the last 72 hours. Studies/Results: No results found. Medications: Infusions:  sodium chloride 250 mL (02/25/21 1021)   anticoagulant sodium citrate     feeding supplement (NEPRO CARB STEADY) 1,000 mL (03/01/21 0331)   fluconazole (DIFLUCAN) IV 200 mg (03/01/21 0920)   piperacillin-tazobactam (ZOSYN)  IV 2.25 g (03/02/21 0426)   promethazine (PHENERGAN) injection (IM or IVPB) 12.5 mg (02/24/21 1950)   vancomycin Stopped (02/28/21 1317)    Scheduled Medications:  apixaban  5 mg Per Tube BID   chlorhexidine  15 mL Mouth Rinse BID   Chlorhexidine Gluconate Cloth  6 each Topical Q0600   clonazePAM  0.5 mg Per Tube BID   diclofenac Sodium  4 g Topical QID   fiber  1 packet Per Tube BID   free water  200 mL Per Tube Q6H   gabapentin  100 mg Per Tube QHS   hydrocerin   Topical BID   insulin aspart  0-15 Units Subcutaneous Q4H   levETIRAcetam  500 mg Per Tube BID    loperamide HCl  1 mg Per Tube Daily   mouth rinse  15 mL Mouth Rinse q12n4p   [START ON 03/03/2021] midodrine  10 mg Per Tube Q T,Th,Sa-HD   nystatin  5 mL Oral QID   pantoprazole sodium  40 mg Per Tube Daily    have reviewed scheduled and prn medications.  Physical Exam: General: alert, NAD-  looks chronically ill Heart: RRR Lungs: CBS bilat Abdomen: obese-  feeding tube Extremities: min perpipheral edema Dialysis Access: left TDC -  non tender    03/02/2021,8:52 AM  LOS: 91 days

## 2021-03-02 NOTE — Plan of Care (Signed)

## 2021-03-02 NOTE — Progress Notes (Signed)
Modified Barium Swallow Progress Note  Patient Details  Name: RAMONA SLINGER MRN: [id number] Date of Birth: 1956-02-14  Today's Date: 03/02/2021  Modified Barium Swallow completed.  Full report located under Chart Review in the Imaging Section.  Brief recommendations include the following:  Clinical Impression  Pt presents with a primarily oral dysphagia with ongoing decreased tongue tip elevation with pt struggling to gather solid and liquid bolus and initaite posterior propulsion. Lingual body otherwise making contact with hard and soft palate and participating in lingual and base of tongue stripping without difficulty. Given impairment there is is piecemeal swallowing, prolonged oral phase and residue on the floor of the mouth. There are occasional instances of slight premature spillage with thin liqudis, though pt tries to prevent this with brief oral hold. In the worst cases, pt has trace silent frank penetration before the swallow, though penetrate ejects and clears with subsequent swallows. Recommend puree/thin liquids with PMSV in place. Will f/u for further therapy with solids.   Swallow Evaluation Recommendations       SLP Diet Recommendations: Dysphagia 1 (Puree) solids;Thin liquid   Liquid Administration via: Cup;Straw   Medication Administration: Crushed with puree   Supervision: Patient able to self feed   Compensations: Slow rate;Small sips/bites   Postural Changes: Remain semi-upright after after feeds/meals (Comment);Seated upright at 90 degrees          Herbie Baltimore, MA CCC-SLP  Acute Rehabilitation Services Office [phone]   Lynann Beaver 03/02/2021,2:09 PM

## 2021-03-02 NOTE — Plan of Care (Signed)

## 2021-03-02 NOTE — Plan of Care (Signed)
No significant changes. Pt was able to have barium swallow study and has oral diet now. Tolerated trach mask. Plan for dialysis tomorrow. CM following for placement.    Problem: Education: Goal: Knowledge of General Education information will improve Description: Including pain rating scale, medication(s)/side effects and non-pharmacologic comfort measures Outcome: Progressing   Problem: Health Behavior/Discharge Planning: Goal: Ability to manage health-related needs will improve Outcome: Progressing   Problem: Clinical Measurements: Goal: Ability to maintain clinical measurements within normal limits will improve Outcome: Progressing Goal: Will remain free from infection Outcome: Progressing Goal: Diagnostic test results will improve Outcome: Progressing Goal: Respiratory complications will improve Outcome: Progressing Goal: Cardiovascular complication will be avoided Outcome: Progressing

## 2021-03-03 DIAGNOSIS — E1100 Type 2 diabetes mellitus with hyperosmolarity without nonketotic hyperglycemic-hyperosmolar coma (NKHHC): Secondary | ICD-10-CM | POA: Diagnosis not present

## 2021-03-03 LAB — CBC
HCT: 26.1 % — ABNORMAL LOW (ref 39.0–52.0)
Hemoglobin: 8.7 g/dL — ABNORMAL LOW (ref 13.0–17.0)
MCH: 27.6 pg (ref 26.0–34.0)
MCHC: 33.3 g/dL (ref 30.0–36.0)
MCV: 82.9 fL (ref 80.0–100.0)
Platelets: 301 10*3/uL (ref 150–400)
RBC: 3.15 MIL/uL — ABNORMAL LOW (ref 4.22–5.81)
RDW: 14.2 % (ref 11.5–15.5)
WBC: 13.3 10*3/uL — ABNORMAL HIGH (ref 4.0–10.5)
nRBC: 0 % (ref 0.0–0.2)

## 2021-03-03 LAB — GLUCOSE, CAPILLARY
Glucose-Capillary: 113 mg/dL — ABNORMAL HIGH (ref 70–99)
Glucose-Capillary: 119 mg/dL — ABNORMAL HIGH (ref 70–99)
Glucose-Capillary: 122 mg/dL — ABNORMAL HIGH (ref 70–99)
Glucose-Capillary: 129 mg/dL — ABNORMAL HIGH (ref 70–99)
Glucose-Capillary: 135 mg/dL — ABNORMAL HIGH (ref 70–99)
Glucose-Capillary: 137 mg/dL — ABNORMAL HIGH (ref 70–99)
Glucose-Capillary: 146 mg/dL — ABNORMAL HIGH (ref 70–99)
Glucose-Capillary: 149 mg/dL — ABNORMAL HIGH (ref 70–99)

## 2021-03-03 LAB — RENAL FUNCTION PANEL
Albumin: 2.1 g/dL — ABNORMAL LOW (ref 3.5–5.0)
Anion gap: 9 (ref 5–15)
BUN: 31 mg/dL — ABNORMAL HIGH (ref 8–23)
CO2: 26 mmol/L (ref 22–32)
Calcium: 8.8 mg/dL — ABNORMAL LOW (ref 8.9–10.3)
Chloride: 97 mmol/L — ABNORMAL LOW (ref 98–111)
Creatinine, Ser: 3.44 mg/dL — ABNORMAL HIGH (ref 0.61–1.24)
GFR, Estimated: 19 mL/min — ABNORMAL LOW (ref >60)
Glucose, Bld: 143 mg/dL — ABNORMAL HIGH (ref 70–99)
Phosphorus: 3.7 mg/dL (ref 2.5–4.6)
Potassium: 3.1 mmol/L — ABNORMAL LOW (ref 3.5–5.1)
Sodium: 132 mmol/L — ABNORMAL LOW (ref 135–145)

## 2021-03-03 LAB — FERRITIN: Ferritin: 507 ng/mL — ABNORMAL HIGH (ref 24–336)

## 2021-03-03 LAB — IRON AND TIBC
Iron: 46 ug/dL (ref 45–182)
Saturation Ratios: 20 % (ref 17.9–39.5)
TIBC: 234 ug/dL — ABNORMAL LOW (ref 250–450)
UIBC: 188 ug/dL

## 2021-03-03 MED ORDER — FREE WATER
200.0000 mL | Freq: Two times a day (BID) | Status: DC
  Administered 2021-03-03 – 2021-03-04 (×2): 200 mL

## 2021-03-03 MED ORDER — DARBEPOETIN ALFA 60 MCG/0.3ML IJ SOSY
60.0000 ug | PREFILLED_SYRINGE | INTRAMUSCULAR | Status: DC
  Filled 2021-03-03: qty 0.3

## 2021-03-03 NOTE — Procedures (Signed)
Patient was seen on dialysis and the procedure was supervised.  BFR 400  Via TDC BP is  147/81.   Patient appears to be tolerating treatment well  Thomas [name] 03/03/2021

## 2021-03-03 NOTE — TOC Progression Note (Signed)
Transition of Care [facility]) - Progression Note    Patient Details  Name: Thomas [name] MRN: [id number] Date of Birth: 10-28-1955  Transition of Care [facility] [place]) CM/SW [place], RN Phone Number: 03/03/2021, 11:47 AM  Clinical Narrative:    CM called and spoke with Arley Phenix, CM with [facility] and asked that she continue to follow the patient for admission opportunity once an HD bed is available at the facility.  CM and MSW with DTP will continue to follow the patient for TOC needs.   Expected Discharge Plan: [place] Barriers to Discharge: Continued Medical Work up  Expected Discharge Plan and Services Expected Discharge Plan: [place]   Discharge Planning Services: CM Consult Post Acute Care Choice: IP Rehab (IP Rehab versus SNF for rehab - has tracheostomy and HD care needs.) Living arrangements for the past 2 months: Single Family Home                                       Social Determinants of Health (SDOH) Interventions    Readmission Risk Interventions Readmission Risk Prevention Plan 02/16/2021  Transportation Screening Complete  Medication Review Press photographer) Complete  PCP or Specialist appointment within 3-5 days of discharge Complete  HRI or Home Care Consult Complete  SW Recovery Care/Counseling Consult Complete  Palliative Care Screening Complete  Skilled Nursing Facility Complete  Some recent data might be hidden

## 2021-03-03 NOTE — Progress Notes (Signed)
Subjective:  No new events-  denies complaint today -  remains on trach collar but CCM talking about downsizing -  seen on HD-- has lots of urine in his bag  Objective Vital signs in last 24 hours: Vitals:   03/03/21 0301 03/03/21 0400 03/03/21 0813 03/03/21 0904  BP:  133/75 127/76 (!) 147/81  Pulse: 83 83 87 82  Resp: _0 Temp:  98.6 F (37 C) 97.6 F (36.4 C) (!) 97.5 F (36.4 C)  TempSrc:  Axillary Oral Oral  SpO2: 95% 96% 99% 100%  Weight:    105.8 kg  Height:       Weight change:   Intake/Output Summary (Last 24 hours) at 03/03/2021 0920 Last data filed at 03/03/2021 0411 Gross per 24 hour  Intake 200 ml  Output 400 ml  Net -200 ml    Assessment/ Plan: Pt is a 65 y.o. yo male with DM, HTN, colon CA and CKD who was admitted on 1/61/0960 with complications of chemo then sepsis/MSOF  Assessment/Plan: 1. Sepsis/asp PNA-  overall improved-  still on zosyn and vanc 2. ESRD -  has progressed to ESRD this admit been HD dep for 2 months but also is non oliguric-  has TDC-  dialyzing TTS-  dispo is difficult due to trach and physical deconditioning.  Plan on next HD today-  3 K  3. Anemia- hgb dropping of late-  will add ESA and check hgb with HD-  no iron as is still on abx-  will check iron stores tomorrow as well.  Not using heparin-  using citrate in cath ?  See hep ab wnl and normal platelets-  could we do heparin ?  Not listed as allergy - will look to add back heparin 4. Secondary hyperparathyroidism-  calc Maren Reamer OK- no binder-  will check PTH and act accordingly  5. HTN/volume-  no signif periph edema-  BP soft,  on midodrine-  on free water with low sodium-  will dec frequency again- making urine so doesn't need much uf 6. Dispo-  difficult due to ESRD and trach -  possibly looking to downsize 7. Trach-  on trach collar-  dont know if plans being considered to downsize trach  8. Colon CA-  complicating factor-  not sure of plan  Thomas [name]    Labs: Basic Metabolic Panel: Recent Labs  Lab 02/27/21 0901 02/28/21 0820 03/02/21 1102  NA 134* 133* 134*  K 3.6 3.4* 3.6  CL 96* 98 97*  CO2 _1 GLUCOSE 182* 101* 160*  BUN 25* 33* 26*  CREATININE 2.93* 3.39* 3.37*  CALCIUM 8.7* 8.3* 8.9  PHOS 4.1 4.1  --    Liver Function Tests: Recent Labs  Lab 02/25/21 1301 02/26/21 0348 02/27/21 0901 02/28/21 0820  AST 16 22  --   --   ALT 20 22  --   --   ALKPHOS 92 97  --   --   BILITOT 1.0 0.9  --   --   PROT 7.1 8.0  --   --   ALBUMIN 2.3* 2.5* 2.5* 2.1*   No results for input(s): LIPASE, AMYLASE in the last 168 hours. No results for input(s): AMMONIA in the last 168 hours. CBC: Recent Labs  Lab 02/25/21 1301 02/26/21 0348 02/27/21 0901 02/28/21 0730 03/02/21 1102  WBC 20.2* 23.3* 14.6* 12.6* 13.2*  NEUTROABS  --  18.0* 10.9*  --   --   HGB 9.9*  11.5* 9.2* 8.1* 9.6*  HCT 28.7* 33.5* 27.2* 24.4* 29.2*  MCV 81.1 80.0 82.2 83.3 83.2  PLT 314 368 319 285 316   Cardiac Enzymes: No results for input(s): CKTOTAL, CKMB, CKMBINDEX, TROPONINI in the last 168 hours. CBG: Recent Labs  Lab 03/02/21 1552 03/02/21 2150 03/02/21 2337 03/03/21 0351 03/03/21 0817  GLUCAP 111* 146* 119* 137* 149*    Iron Studies: No results for input(s): IRON, TIBC, TRANSFERRIN, FERRITIN in the last 72 hours. Studies/Results: DG Swallowing Func-Speech Pathology  Result Date: 03/02/2021 Table formatting from the original result was not included. 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  Dizziness   Dyspnea   pt states d/t weight  Epilepsy ([place])   takes Keppra daily  GERD (gastroesophageal reflux disease)   takes Omeprazole daily  Headache   Hyperlipidemia   takes Atorvastatin daily  Hypertension   takes Lotrel daily  Peripheral edema   takes Lasix daily  Peripheral neuropathy   takes Gabapentin daily  Pneumonia 3 yrs ago  hx of  Seizures ([place])   Sleep apnea   Urinary frequency  Past Surgical History: Past Surgical History: Procedure Laterality Date  BIOPSY  09/05/2020  Procedure: BIOPSY;  Surgeon: Irene Shipper, MD;  Location: [facility] ENDOSCOPY;  Service: Endoscopy;;  BIOPSY  02/06/2021  Procedure: BIOPSY;  Surgeon: Jerene Bears, MD;  Location: [place] ENDOSCOPY;  Service: Gastroenterology;;  CARDIAC CATHETERIZATION  7 yrs ago  COLONOSCOPY WITH PROPOFOL N/A 09/05/2020  Procedure: COLONOSCOPY WITH PROPOFOL;  Surgeon: Irene Shipper, MD;  Location: [place];  Service: Endoscopy;  Laterality: N/A;  CRANIOTOMY N/A 09/01/2016  Procedure: CRANIOTOMY TUMOR  LEFT PTERIONAL;  Surgeon: Ashok Pall, MD;  Location: [place];  Service: Neurosurgery;  Laterality: N/A;  cyst removed from chest     as a child  ESOPHAGOGASTRODUODENOSCOPY (EGD) WITH PROPOFOL N/A 02/06/2021  Procedure: ESOPHAGOGASTRODUODENOSCOPY (EGD) WITH PROPOFOL;  Surgeon: Jerene Bears, MD;  Location[facility] ENDOSCOPY;  Service: Gastroenterology;  Laterality: N/A;  IR FLUORO GUIDE CV LINE LEFT  12/22/2020  IR GASTROSTOMY TUBE MOD SED  02/10/2021  IR US GUIDE VASC ACCESS LEFT  12/22/2020  NO PAST SURGERIES    PARTIAL COLECTOMY N/A 09/10/2020  Procedure: TRANSVERSE COLECTOMY;  Surgeon: [name], MD;  Location: [place];  Service: General;  Laterality: N/A;  POLYPECTOMY  09/05/2020  Procedure: POLYPECTOMY;  Surgeon: Irene Shipper, MD;  Location: [place];  Service: Endoscopy;;   PORTACATH PLACEMENT Right 10/01/2020  Procedure: INSERTION PORT-A-CATH;  Surgeon: [name], MD;  Location: [place];  Service: General;  Laterality: Right;  SUBMUCOSAL TATTOO INJECTION  09/05/2020  Procedure: SUBMUCOSAL TATTOO INJECTION;  Surgeon: Irene Shipper, MD;  Location: [place];  Service: Endoscopy;;  TRACHEOSTOMY TUBE PLACEMENT N/A 01/05/2021  Procedure: TRACHEOSTOMY;  Surgeon: [name], MD;  Location: [place];  Service: ENT;  Laterality: N/A; HPI: Pt is a 65 y/o  male admitted on 7/25 for worsening sepsis vs reaction to chemotherapy with high ostomy output.  Pt developed worsening hypoxia and was requiring BIPAP w/ FIO2 of 100% and increased work of breathing. SLP consulted due to concern for aspiration. BSE 7/28 recommended NPO status and MBS, but this could not completed subsequently due to lethargy. CXR in AM of 7/30 showed some worsening concern of aspiration. ETT 7/30-8/8; tolerated nasal cannula for about an hour; reintubated 8/8-trach 8/11. Pt pulled out trach x2 8/22; replaced by PCCM. Bronch on 8/22 revealed endotracheal granulation tissue at trach site. Trach removed again on 8/25; unable to reinsert despite multiple attenpts; pt reintubated 8/25- trach by ENT on 8/29. BSE 8/22 limited to ice chips due to pt's refusal of other consistencies; rx NPO status with Cortrak.MRI brain 9/30: Multiple punctate foci of restricted diffusion in bilateral  frontal white matter, most likely acute infarcts. EGD 9/30:grade C reflux esophagitis/non-bleeding duodenal ulcers.Pt s/p PEG 10/4.  Trach changed to cuffed and pt transferred to ICU on 10/5 due to respiratory distress with mucous plug; bronch compled; vent 10/5-10/6. Bronch 10/5: Significant mucous plugging of the LUL with BAL and suctioning up until the subsegmental region. Trach replaced to #[id number] uncuffed. 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  Subjective: alert, cortrak removed Assessment / Plan / Recommendation [facility] IP CLINICAL IMPRESSIONS 03/02/2021 Clinical Impression Pt presents with a primarily oral dysphagia with ongoing decreased tongue tip elevation with pt struggling to gather solid and liquid bolus and initaite posterior propulsion. Lingual body otherwise making contact with hard and soft palate and participating in lingual and base of tongue stripping without difficulty. Given impairment there is is piecemeal swallowing, prolonged oral phase and residue on the floor of the mouth. There are occasional instances of slight premature spillage with thin liquids, though pt tries to prevent this with brief oral hold. In the worst cases, pt has trace silent frank penetration before the swallow, though penetrate ejects and clears with subsequent swallows. Recommend puree/thin liquids with PMSV in place. Will f/u for further therapy with solids. SLP Visit Diagnosis Dysphagia, oropharyngeal phase (R13.12) Attention and concentration deficit following -- Frontal lobe and executive function deficit following -- Impact on safety and function Mild aspiration risk   [facility] IP TREATMENT RECOMMENDATION 03/02/2021 Treatment Recommendations Therapy as outlined in treatment plan below   Prognosis 02/09/2021 Prognosis for Safe Diet Advancement Fair Barriers to Reach Goals Time post onset;Severity of deficits Barriers/Prognosis Comment -- [facility] IP DIET RECOMMENDATION 03/02/2021 SLP Diet Recommendations Dysphagia 1 (Puree) solids;Thin liquid Liquid Administration via Cup;Straw Medication Administration Crushed with puree Compensations Slow rate;Small sips/bites Postural Changes Remain semi-upright after after feeds/meals (Comment);Seated upright at 90 degrees   [facility] IP OTHER RECOMMENDATIONS 02/09/2021 Recommended  Consults -- Oral Care Recommendations Oral care QID Other Recommendations --   [facility] IP FOLLOW UP RECOMMENDATIONS 03/02/2021 Follow up Recommendations Skilled Nursing facility   [facility] IP FREQUENCY AND DURATION 03/02/2021 Speech Therapy Frequency (ACUTE ONLY) min 2x/week Treatment Duration 2 weeks      CH[facility] IP ORAL PHASE 03/02/2021 Oral Phase Impaired Oral - Pudding Teaspoon -- Oral - Pudding Cup -- Oral - Honey Teaspoon -- Oral - Honey Cup -- Oral - Nectar Teaspoon NT Oral - Nectar Cup -- Oral - Nectar Straw -- Oral - Thin Teaspoon  NT Oral - Thin Cup Decreased bolus cohesion;Lingual/palatal residue;Pocketing in anterior sulcus;Reduced posterior propulsion;Incomplete tongue to palate contact;Piecemeal swallowing;Right pocketing in lateral sulci;Left pocketing in lateral sulci Oral - Thin Straw Decreased bolus cohesion;Lingual/palatal residue;Pocketing in anterior sulcus;Reduced posterior propulsion;Incomplete tongue to palate contact;Piecemeal swallowing;Right pocketing in lateral sulci;Left pocketing in lateral sulci Oral - Puree Decreased bolus cohesion;Lingual/palatal residue;Pocketing in anterior sulcus;Reduced posterior propulsion;Incomplete tongue to palate contact;Piecemeal swallowing;Right pocketing in lateral sulci;Left pocketing in lateral sulci Oral - Mech Soft Decreased bolus cohesion;Lingual/palatal residue;Pocketing in anterior sulcus;Reduced posterior propulsion;Incomplete tongue to palate contact;Piecemeal swallowing;Right pocketing in lateral sulci;Left pocketing in lateral sulci Oral - Regular NT Oral - Multi-Consistency -- Oral - Pill Decreased bolus cohesion;Lingual/palatal residue;Pocketing in anterior sulcus;Reduced posterior propulsion;Incomplete tongue to palate contact;Piecemeal swallowing;Right pocketing in lateral sulci;Left pocketing in lateral sulci Oral Phase - Comment --  CH[facility] IP PHARYNGEAL PHASE 03/02/2021 Pharyngeal Phase Impaired Pharyngeal- Pudding Teaspoon -- Pharyngeal -- Pharyngeal-  Pudding Cup -- Pharyngeal -- Pharyngeal- Honey Teaspoon -- Pharyngeal -- Pharyngeal- Honey Cup -- Pharyngeal -- Pharyngeal- Nectar Teaspoon NT Pharyngeal -- Pharyngeal- Nectar Cup -- Pharyngeal -- Pharyngeal- Nectar Straw -- Pharyngeal -- Pharyngeal- Thin Teaspoon NT Pharyngeal -- Pharyngeal- Thin Cup Penetration/Aspiration before swallow Pharyngeal Material enters airway, CONTACTS cords and then ejected out Pharyngeal- Thin Straw Penetration/Aspiration before swallow;Trace aspiration Pharyngeal Material enters airway, CONTACTS cords and then ejected out;Material enters airway, passes BELOW cords then ejected out;Material does not enter airway Pharyngeal- Puree WFL Pharyngeal -- Pharyngeal- Mechanical Soft -- Pharyngeal -- Pharyngeal- Regular Delayed swallow initiation-pyriform sinuses Pharyngeal -- Pharyngeal- Multi-consistency -- Pharyngeal -- Pharyngeal- Pill -- Pharyngeal -- Pharyngeal Comment --  CH[facility] IP CERVICAL ESOPHAGEAL PHASE 01/19/2021 Cervical Esophageal Phase (No Data) Pudding Teaspoon -- Pudding Cup -- Honey Teaspoon -- Honey Cup -- Nectar Teaspoon -- Nectar Cup -- Nectar Straw -- Thin Teaspoon -- Thin Cup -- Thin Straw -- Puree -- Mechanical Soft -- Regular -- Multi-consistency -- Pill -- Cervical Esophageal Comment -- DeBlois, Katherene Ponto 03/02/2021, 2:10 PM              Medications: Infusions:  sodium chloride 250 mL (02/25/21 1021)   anticoagulant sodium citrate     feeding supplement (NEPRO CARB STEADY) 1,320 mL (03/02/21 2159)   fluconazole (DIFLUCAN) IV 200 mg (03/02/21 1445)   piperacillin-tazobactam (ZOSYN)  IV 2.25 g (03/03/21 0411)   promethazine (PHENERGAN) injection (IM or IVPB) 12.5 mg (02/24/21 1950)    Scheduled Medications:  apixaban  5 mg Per Tube BID   atorvastatin  20 mg Oral QHS   chlorhexidine  15 mL Mouth Rinse BID   Chlorhexidine Gluconate Cloth  6 each Topical Q0600   Chlorhexidine Gluconate Cloth  6 each Topical Q0600   clonazePAM  0.5 mg Per Tube BID    diclofenac Sodium  4 g Topical QID   fiber  1 packet Per Tube BID   free water  200 mL Per Tube Q8H   gabapentin  100 mg Per Tube QHS   hydrocerin   Topical BID   insulin aspart  0-15 Units Subcutaneous Q4H   levETIRAcetam  500 mg Per Tube BID   loperamide HCl  1 mg Per Tube Daily   mouth rinse  15 mL Mouth Rinse q12n4p   midodrine  10 mg Per Tube Q T,Th,Sa-HD   nystatin  5 mL Oral QID   pantoprazole sodium  40 mg Per Tube Daily    have reviewed scheduled and prn medications.  Physical Exam: General: alert, NAD-  looks chronically ill  Heart: RRR Lungs: CBS bilat Abdomen: obese-  feeding tube Extremities: min perpipheral edema Dialysis Access: left TDC -  non tender    03/03/2021,9:20 AM  LOS: 92 days

## 2021-03-03 NOTE — Progress Notes (Signed)
03/03/2021   I have seen and evaluated the patient for trach care.  S:  Doing well.  Denies pain.  Occasional secretions that he is able to expectorate.  MBSS noted yay!  O: Blood pressure 127/76, pulse 87, temperature 97.6 F (36.4 C), temperature source Oral, resp. rate 18, height 5\' 8"  (1.727 m), weight 105.9 kg, SpO2 99 %.  No distress Able to breath and talk with trach occluded Lungs clear PEG in place Ext with minimal edema Moves all 4 ext to command, weak  A:  Recurrent aspirations and mucus plugging leading to tracheostomy Advanced colon cancer post colectomy Deconditioning Hx DVT Recurrent urinary retention  P:  Downsize to 4 cuffless Consider capping trials later this week Dysphagia diet to be tried Will check on tomorrow  Erskine Emery MD [facility] Prefer epic messenger for cross cover needs If after hours, please call E-link

## 2021-03-03 NOTE — Progress Notes (Signed)
OT Cancellation Note  Patient Details Name: CYLUS DOUVILLE MRN: [id number] DOB: 01-12-1956   Cancelled Treatment:    Reason Eval/Treat Not Completed: Patient at procedure or test/ unavailable. Off floor for HD. OT to check back 10/26.   [name] OTR/L Supplemental OT, Department of rehab services [phone]  Claudean Leavelle R H. 03/03/2021, 11:49 AM

## 2021-03-03 NOTE — Progress Notes (Signed)
SLP Cancellation Note  Patient Details Name: Thomas [name] MRN: [id number] DOB: January 14, 1956   Cancelled treatment:       Reason Eval/Treat Not Completed: Patient at procedure or test/unavailable. Pt in HD. Will f/u tomorrow   Lynann Beaver 03/03/2021, 9:32 AM

## 2021-03-03 NOTE — Progress Notes (Signed)
[facility] PROGRESS NOTE  Thomas [name] LDJ:[id number] DOB: 23-Aug-1955 DOA: 11/30/2020 PCP: [name], NP   03/03/2021  Status: Remains inpatient appropriate because: Persistent severe electrolyte disturbances, Unsafe d/c plan, and Inpatient level of care appropriate due to severity of illness  Barriers to discharge:  Currently has tracheostomy in place and is requiring dialysis but remains too weak to tolerate dialysis in chair which is a requirement for outpatient HD  Level of care:  Progressive  Code Status: Full Family communication: Wife on 10/21 who is at the bedside DVT prophylaxis: Eliquis COVID vaccination status: Moderna 07/08/2019 and 08/11/2019 with first booster dose given 08/05/2020   HPI:  65 y.o. male with a history of T2DM with neuropathy, HTN, HLD., colon cancer s/p resection and colostomy May 2022 s/p 3 cycles chemotherapy (last was July 2022). Followed by Dr. Benay Spice. Started CAPOX June 2022, reduced cepecitabine dose starting 11/19/2020 due to hand/foot syndrome, reduced oxaliplatin due to renal impairment and poor performance status. Presented to the ED 7/24 with abdominal pain found to be febrile, tachycardic, tachypneic. Lactic acid was 6.6 and persisted at 6.8 after sepsis IV fluid bolus and cefepime. He had ARF, demand ischemia, bandemia, was hyperglycemic with negative ketones, with lactic acidosis. He developed AFib with RVR in the ED, was started on diltiazem, and was admitted to the ICU for septic shock with unclear source. Sepsis was subsequently felt to be ruled out at time of admission, presentation felt more likely to be related to cepacitabine toxicity with oral mucositis, hand/foot syndrome, and diarrhea/high output colostomy.   He was transferred to hospitalist service 7/27 but developed metabolic encephalopathy with hyoxia due to aspiration pneumonia on 7/30 requiring intubation and pressor support. Failed extubation with inability to clear  secretions, requiring reintubation 8/8, and tracheostomy subsequently placed 8/11. This was pulled out 8/22, replaced under bronch guidance, again removed 8/25 requiring reintubation after failed reinsertion. ENT revised tracheostomy in the OR 8/29. He has required IHD for ARF, and also had complications of RLE DVT now on eliquis, colitis thought to be ischemic 8/2 not requiring specific intervention, Pseudomonas pneumonia having completed treatment, agitated encephalopathy which has improved, anemia requiring 4u PRBCs total, and thrombocytopenia which has resolved. On 9/4 he was transferred to hospitalist service having tolerated trach collar.  Patient was also evaluated by Dr. Benay Spice during this admission clarified that patient was receiving curative chemotherapy prior to admission  Subsequently patient has had issues with bronchial plugging requiring urgent bronchoscopy and transient readmission to the ICU.  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  He has had episodes of aspiration pneumonitis.  He has had recurrent urinary retention despite several attempts to remove Foley therefore Foley remains in place.  He has had 2 episodes of funguria.   Subjective:  Patient alert and mostly oriented today.  Appropriately engages in conversation.  Discussed possibility of transitioning out of stepdown level of care by the end of the week if he remains stable.  He told me he wanted to transition back to his bed at his house!!  Objective: Vitals:   03/03/21 0301 03/03/21 0400  BP:  133/75  Pulse: 83 83  Resp: 15 13  Temp:  98.6 F (37 C)  SpO2: 95% 96%    Intake/Output Summary (Last 24 hours) at 03/03/2021 0738 Last data filed at 03/03/2021 0411 Gross per 24 hour  Intake 200 ml  Output 1100 ml  Net -900 ml  Filed Weights   02/28/21 1159 03/01/21 0432 03/02/21 0431  Weight: 103 kg 106.2 kg 105.9 kg     Exam: Constitutional: Alert, no acute distress Respiratory: #6 XLT cuffless trach, RA to TC, lung sounds CTA, oximetry 97 to 100%, no increased work of breathing Cardiac: S1-S2, regular pulse without tachycardia, no peripheral edema, skin warm and dry, normotensive Abdomen: Abdomen soft, nontender, PEG site unremarkable, normoactive bowel sounds.  Colostomy collection bag with light brown liquid stool  Genitourinary: Foley in place with yellow urine noted Neurologic: CN 2-12 grossly intact.  Strength of both upper and lower extremities estimated 3/5.   Psychiatric: Awakens, oriented x3 at present.  Pleasant affect.   Assessment/Plan: Acute problems: Sepsis with hypotension -Suspect aspiration pneumonia sewed of postdialysis emesis -Sepsis physiology has resolved -Recurrent funguria-Diflucan IV  Volume depletion with hypotension -Patient has been having very large volume liquid stools from colostomy and likely has significant GI losses contributing to low blood pressure which has now normalized after 48 hours of IV fluids -Continue scheduled Imodium daily  -Continue Nutrisource fiber per tube   Acute hypoxic respiratory failure s/p tracheostomy due to aspiration pneumonia /Recurrent pansensitive Pseudomonas tracheobronchitis with mucous plugging on 10/7 -Status post bronchoscopy  -Tolerating PMV with SLP following -Recent episode of aspiration pneumonitis -has completed 6 days of Zosyn  Symptomatic anemia/Anemia of critical illness, acute blood loss as well as anemia of chronic disease and iron deficiency:  -He has received a total of 5 units PRBCs since admission with current hemoglobin stable at  Acute renal failure on stage IIIb CKD/Presumed ischemic ATN -started CRRT 7/31 > iHD 8/12 s/p left IJ [facility] 8/15.   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  Wife and patient wish to continue aggressive treatment  Grade LA C reflux esophagitis/nonbleeding duodenal ulcers -Continue PPI for total of 4-weeks (initiated 9/29) -Follow-up EGD recommended after discharge  Inability to elevate tongue with associated oral dysphagia  -Neurology has documented issues not related to recent stroke or recurrent intubation -SLP also noted during MBS that with attempts at swallowing patient was unable to move muscles and neck and there was no visible movement of hyoid bone on fluoroscopy -10/24 SLP/MBS recommended dysphagia 1 diet with thin liquids.  Urinary retention -Did not tolerate discontinuation of Foley catheter so reinserted on 10/10 (Foley catheter has been discontinued a total of 3 times this admission with recurrent urinary retention) -Will need eventual follow-up with urology  Dysphagia/protein calorie malnutrition -PEG placed 10/4 -Continue tube feedings -MBS completed on 10/24  MRI with bilateral punctate lesion (Frontal watershed) -Stroke team differential included thrombotic etiology from either A. 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 -CBGs well controlled with SSI alone.  Continue to hold Levemir for now -Gabapentin resumed   HTN:  -BP controlled off of Norvasc   HLD:  -Resume atorvastatin      Pressure Injury 02/05/21 Coccyx Lower Stage 2 -  Partial thickness loss of dermis presenting as a shallow open injury with a red, pink wound bed without slough. healing quarter sized spot on sacrum (Active)  Date First Assessed/Time First Assessed: 02/05/21 0800   Location: Coccyx  Location Orientation: Lower  Staging: Stage 2 -  Partial thickness loss of dermis presenting as a shallow open injury with a red, pink wound bed without slough.  Wound Descript...    Assessments 02/05/2021  5:10 PM 03/02/2021  8:00 AM  Dressing Type Foam - Lift dressing to assess site every shift Foam - Lift dressing to assess site every shift  Dressing Changed Intact  Dressing Change Frequency -- PRN  State of Healing Early/partial granulation --  Site / Wound Assessment Pink --  % Wound base Red or Granulating 100% --  Wound Length (cm) 2 cm --  Wound Width (cm) 2 cm --  Wound Surface Area (cm^2) 4 cm^2 --  Margins Attached edges (approximated) --  Drainage Amount None --  Treatment Cleansed --     No Linked orders to display     Data Reviewed: Basic Metabolic Panel: Recent Labs  Lab 02/24/21 0840 02/25/21 0149 02/25/21 1301 02/26/21 0348 02/27/21 0901 02/28/21 0820 03/02/21 1102  NA 126*   < >  132* 134* 134* 133* 134*  K 3.7   < > 4.0 3.8 3.6 3.4* 3.6  CL 87*   < > 96* 97* 96* 98 97*  CO2 25   < > 23 17* 27 26 26   GLUCOSE 152*   < > 126* 111* 182* 101* 160*  BUN 84*   < > 41* 50* 25* 33* 26*  CREATININE 4.11*   < > 3.36* 4.02* 2.93* 3.39* 3.37*  CALCIUM 9.2   < > 8.5* 9.3 8.7* 8.3* 8.9  PHOS 6.3*  --   --   --  4.1 4.1  --    < > = values in this interval not displayed.   Liver Function Tests: Recent Labs  Lab 02/24/21 0840 02/25/21 1301 02/26/21 0348 02/27/21 0901 02/28/21 0820  AST  --  16 22  --   --   ALT  --  20 22  --   --   ALKPHOS  --  92 97  --   --   BILITOT  --  1.0 0.9  --   --   PROT  --  7.1 8.0  --   --   ALBUMIN 2.2* 2.3* 2.5* 2.5* 2.1*    CBC: Recent Labs  Lab 02/25/21 1301 02/26/21 0348 02/27/21 0901 02/28/21 0730 03/02/21 1102  WBC 20.2* 23.3* 14.6* 12.6* 13.2*  NEUTROABS  --  18.0* 10.9*  --   --   HGB 9.9*  11.5* 9.2* 8.1* 9.6*  HCT 28.7* 33.5* 27.2* 24.4* 29.2*  MCV 81.1 80.0 82.2 83.3 83.2  PLT 314 368 319 285 316    CBG: Recent Labs  Lab 03/02/21 1210 03/02/21 1552 03/02/21 2150 03/02/21 2337 03/03/21 0351  GLUCAP 131* 111* 146* 119* 137*    Scheduled Meds:  apixaban  5 mg Per Tube BID   atorvastatin  20 mg Oral QHS   chlorhexidine  15 mL Mouth Rinse BID   Chlorhexidine Gluconate Cloth  6 each Topical Q0600   Chlorhexidine Gluconate Cloth  6 each Topical Q0600   clonazePAM  0.5 mg Per Tube BID   diclofenac Sodium  4 g Topical QID   fiber  1 packet Per Tube BID   free water  200 mL Per Tube Q8H   gabapentin  100 mg Per Tube QHS   hydrocerin   Topical BID   insulin aspart  0-15 Units Subcutaneous Q4H   levETIRAcetam  500 mg Per Tube BID   loperamide HCl  1 mg Per Tube Daily   mouth rinse  15 mL Mouth Rinse q12n4p   midodrine  10 mg Per Tube Q T,Th,Sa-HD   nystatin  5 mL Oral QID   pantoprazole sodium  40 mg Per Tube Daily   Continuous Infusions:  sodium chloride 250 mL (02/25/21 1021)   anticoagulant sodium  citrate     feeding supplement (NEPRO CARB STEADY) 1,320 mL (03/02/21 2159)   fluconazole (DIFLUCAN) IV 200 mg (03/02/21 1445)   piperacillin-tazobactam (ZOSYN)  IV 2.25 g (03/03/21 0411)   promethazine (PHENERGAN) injection (IM or IVPB) 12.5 mg (02/24/21 1950)    Principal Problem:   Diabetes mellitus with hyperosmolarity without hyperglycemic hyperosmolar nonketotic coma (HCC) Active Problems:   Lactic acidosis   Acute renal failure superimposed on stage 2 chronic kidney disease (HCC)   Metabolic acidosis   Tachycardia   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 (HCC)   Adverse effect of chemotherapy   Pressure injury of skin   Atrial fibrillation with RVR (HCC)   Sepsis (HCC)   Malnutrition of moderate degree   Acute metabolic encephalopathy   Acute respiratory failure with hypoxemia (HCC)   Acute renal failure with acute cortical necrosis (HCC)   Shock (HCC)   Status post tracheostomy ([place])   Endotracheal tube present   Hemodialysis status ([place])   Palliative care by specialist   SOB (shortness of breath)   Non-intractable vomiting   Abdominal pain, epigastric   Cerebral embolism with cerebral infarction   Acute esophagitis   Duodenal ulcer disease   Consultants: PCCM General surgery Nephrology Hematology/oncology Palliative care medicine team  Procedures: 2u PRBC 7/31, 1u PRBC 8/17, 1u PRBC 8/27 Tracheostomy revision 01/05/2021 Dr. Constance Holster [facility] 8/22 10/7 briefly changed to cuffed trach to allow for short-term mechanical ventilation and subsequently changed back to cuffless trach on 10/8    Antibiotics: Vancomycin 7/24 Cefepime 7/24 - 7/27 Unasyn 7/30 >> cefepime/flagyl 7/31 - 8/6  Cefazolin x1 dose on 10/ Zosyn 10/5 through 10/9 Vancomycin 10/5 through 10/6 10/19 Zosyn >>    Time spent: 40 minutes    [name] ANP  [facility] 7 am - 330 pm/M-F for direct patient care and secure chat Please refer to Amion for contact  info 92  days

## 2021-03-03 NOTE — Procedures (Signed)
Tracheostomy Change Note  Patient Details:   Name: Thomas [name] DOB: [birth year] MRN: [id number]    Airway Documentation:  Trach tube changed per MD order with help of a second RT. Tube exchanger used due to patient being difficult airway. Positive color change on ETCO2 detector. Pt tolerated well. Trach secured. CCM Dr Tamala Julian okay with not being present during change.   Evaluation  O2 sats: stable throughout Complications: No apparent complications Patient did tolerate procedure well. Bilateral Breath Sounds: Diminished    Ander Purpura 03/03/2021, 3:37 PM

## 2021-03-03 NOTE — Progress Notes (Signed)
RT NOTES: Pt not in room for 1200 trach assessment. Will check back later.

## 2021-03-04 DIAGNOSIS — Z430 Encounter for attention to tracheostomy: Secondary | ICD-10-CM | POA: Diagnosis not present

## 2021-03-04 DIAGNOSIS — J9611 Chronic respiratory failure with hypoxia: Secondary | ICD-10-CM

## 2021-03-04 DIAGNOSIS — E1100 Type 2 diabetes mellitus with hyperosmolarity without nonketotic hyperglycemic-hyperosmolar coma (NKHHC): Secondary | ICD-10-CM | POA: Diagnosis not present

## 2021-03-04 LAB — GLUCOSE, CAPILLARY
Glucose-Capillary: 121 mg/dL — ABNORMAL HIGH (ref 70–99)
Glucose-Capillary: 128 mg/dL — ABNORMAL HIGH (ref 70–99)
Glucose-Capillary: 129 mg/dL — ABNORMAL HIGH (ref 70–99)
Glucose-Capillary: 130 mg/dL — ABNORMAL HIGH (ref 70–99)
Glucose-Capillary: 146 mg/dL — ABNORMAL HIGH (ref 70–99)
Glucose-Capillary: 162 mg/dL — ABNORMAL HIGH (ref 70–99)

## 2021-03-04 LAB — PARATHYROID HORMONE, INTACT (NO CA): PTH: 39 pg/mL (ref 15–65)

## 2021-03-04 MED ORDER — FREE WATER
200.0000 mL | Freq: Four times a day (QID) | Status: DC
  Administered 2021-03-04 – 2021-03-06 (×7): 200 mL

## 2021-03-04 MED ORDER — FLUCONAZOLE 100 MG PO TABS
200.0000 mg | ORAL_TABLET | Freq: Every day | ORAL | Status: AC
  Administered 2021-03-05 – 2021-03-07 (×3): 200 mg
  Filled 2021-03-04 (×3): qty 2

## 2021-03-04 MED ORDER — SODIUM CHLORIDE 0.9 % IV SOLN
125.0000 mg | Freq: Every day | INTRAVENOUS | Status: AC
  Administered 2021-03-04 – 2021-03-07 (×4): 125 mg via INTRAVENOUS
  Filled 2021-03-04 (×4): qty 10

## 2021-03-04 NOTE — TOC Progression Note (Signed)
Transition of Care [facility]) - Progression Note    Patient Details  Name: HARSHAN KEARLEY MRN: [id number] Date of Birth: 24-Jun-1955  [facility]) CM/SW [place], RN Phone Number: 03/04/2021, 9:24 AM  Clinical Narrative:    CM and MSW with DTP Team continue to follow the patient for discharge planning.  CM with [facility] and [facility] continue to follow the patient for need for HD bed for admission.  I also asked that CM with [facility] to continue to follow the patient for potential admission considering patient to start capping trials with tracheostomy.  CM and MSW to follow for discharge planning.   Expected Discharge Plan: [place] Barriers to Discharge: Continued Medical Work up  Expected Discharge Plan and Services Expected Discharge Plan: [place]   Discharge Planning Services: CM Consult Post Acute Care Choice: IP Rehab (IP Rehab versus SNF for rehab - has tracheostomy and HD care needs.) Living arrangements for the past 2 months: Single Family Home                                       Social Determinants of Health (SDOH) Interventions    Readmission Risk Interventions Readmission Risk Prevention Plan 02/16/2021  Transportation Screening Complete  Medication Review Press photographer) Complete  PCP or Specialist appointment within 3-5 days of discharge Complete  HRI or Home Care Consult Complete  SW Recovery Care/Counseling Consult Complete  Palliative Care Screening Complete  Skilled Nursing Facility Complete  Some recent data might be hidden

## 2021-03-04 NOTE — Progress Notes (Signed)
Occupational Therapy Treatment Patient Details Name: Thomas [name] MRN: [id number] DOB: March 23, 1956 Today's Date: 03/04/2021   History of present illness 65 y.o. male admitted 7/24 with high ostomy output. Pt with severe sepsis and multisystem organ failure. Hospital admission complicated by volume overload, aspiration pneumonia, acute respiratory failure, AKI and ischemic colitis. Intubated 7/30. S/p trach placement 8/11. CorTrak 8/12. HD initiated 8/12. To IR 8/15 for tunneled HD cath. Pulled out trach x2 8/22 replaced by PCCM. Pulled out trach again 8/25 but unable to be reinserted with pt intubated. 8/29 to OR for stoma clean up and trach replacement.Trach downsized 9/10. 10/5 respiratory distress with mucous plug requiring bronch, trach change and ventilator 10/5-10/6.  10/6 transitioned to trach collar, 10/7 pulled trach and inserted by RT with no complications. MD held HD 10/26 given increased urine output. 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   OT comments  OT treatment session with focus on self-care re-education, bed mobility, static/dynamic sitting/standing balance, functional transfers and activity tolerance. Patient has progressed from requiring +2 assist for bed mobility to only requiring Mod A +1 to come to EOB with increased time/effort. Patient also demonstrates improved sitting balance requiring close supervision A grossly and Min guard on occasion. Patient continues to require +2 assist for sit to stand transfers with use of RW and would benefit from continued acute OT services in prep for safe d/c to next level of care. Goals updated appropriately. OT will continue to follow acutely.    Recommendations for follow up therapy are one component of a multi-disciplinary  discharge planning process, led by the attending physician.  Recommendations may be updated based on patient status, additional functional criteria and insurance authorization.    Follow Up Recommendations  Skilled nursing-short term rehab (<3 hours/day)    Assistance Recommended at Discharge Frequent or constant Supervision/Assistance  Equipment Recommendations  Other (comment) (Defer to next level of care)    Recommendations for Other Services      Precautions / Restrictions Precautions Precautions: Fall Precaution Comments: R subclavian tunneled HD cath, ostomy, peg, trach Restrictions Weight Bearing Restrictions: No       Mobility Bed Mobility Overal bed mobility: Needs Assistance Bed Mobility: Supine to Sit     Supine to sit: Mod assist     General bed mobility comments: Able to initiate advancement of BLE from bed surface toward EOB. Mod A to bring trunk upright with HOB elevate. Increased time/effort.    Transfers Overall transfer level: Needs assistance Equipment used: Rolling walker (2 wheels);2 person hand held assist Transfers: Sit to/from Stand Sit to Stand: Max assist;+2 physical assistance           General transfer comment: Max A +2 x2 trials for sit to stand from EOB. Strong posterior bias requiring external assist and Max cues to correct.     Balance Overall balance assessment: Needs assistance Sitting-balance support: No upper extremity supported;Feet supported Sitting balance-Leahy Scale: Poor Sitting balance - Comments: Improved sitting balance at EOB to Min guard-close supervision A.   Standing balance support: Bilateral upper extremity supported Standing balance-Leahy Scale: Poor Standing balance comment: Reliant on BUE support on RW and +2 external assist.                           ADL either  performed or assessed with clinical judgement   ADL Overall ADL's : Needs assistance/impaired     Grooming: Minimal  assistance;Sitting   Upper Body Bathing: Minimal assistance;Sitting   Lower Body Bathing: Maximal assistance;+2 for physical assistance;+2 for safety/equipment   Upper Body Dressing : Moderate assistance;Sitting   Lower Body Dressing: Maximal assistance;+2 for physical assistance;+2 for safety/equipment                       Vision       Perception     Praxis      Cognition Arousal/Alertness: Awake/alert Behavior During Therapy: Flat affect Overall Cognitive Status: Impaired/Different from baseline Area of Impairment: Orientation;Memory;Following commands;Safety/judgement;Awareness;Problem solving                 Orientation Level: Disoriented to;Time Current Attention Level: Sustained Memory: Decreased short-term memory Following Commands: Follows one step commands with increased time Safety/Judgement: Decreased awareness of deficits Awareness: Intellectual Problem Solving: Slow processing General Comments: Patient with increased alertness this date. Able to make needs known/express location of pain. Patient excited about news of MD holding HD 10/27 given increased urine output.          Exercises General Exercises - Lower Extremity Hip Flexion/Marching: AROM;Both;Seated Toe Raises: AROM;10 reps;Seated Heel Raises: AROM;Both;Seated   Shoulder Instructions       General Comments VSS on 5L via trach collar.    Pertinent Vitals/ Pain       Pain Assessment: Faces Faces Pain Scale: Hurts even more Pain Location: abdomen Pain Descriptors / Indicators: Moaning;Grimacing Pain Intervention(s): Limited activity within patient's tolerance;Monitored during session;Repositioned  Home Living                                          Prior Functioning/Environment              Frequency  Min 2X/week        Progress Toward Goals  OT Goals(current goals can now be found in the care plan section)  Progress towards OT goals:  Progressing toward goals  Acute Rehab OT Goals OT Goal Formulation: With patient Time For Goal Achievement: 03/18/21 Potential to Achieve Goals: Good ADL Goals Pt Will Perform Grooming: with set-up;sitting Pt Will Perform Upper Body Dressing: with set-up;sitting Pt/caregiver will Perform Home Exercise Program: Increased ROM;Increased strength;Both right and left upper extremity Additional ADL Goal #1: Patient will perform bed mobility with Min A in prep for ADLs.  Plan Discharge plan remains appropriate;Frequency remains appropriate    Co-evaluation                 AM-PAC OT "6 Clicks" Daily Activity     Outcome Measure   Help from another person eating meals?: A Little Help from another person taking care of personal grooming?: A Little Help from another person toileting, which includes using toliet, bedpan, or urinal?: Total Help from another person bathing (including washing, rinsing, drying)?: Total Help from another person to put on and taking off regular upper body clothing?: A Lot Help from another person to put on and taking off regular lower body clothing?: Total 6 Click Score: 11    End of Session Equipment Utilized During Treatment: Oxygen;Gait belt;Rolling walker (2 wheels)  OT Visit Diagnosis: Unsteadiness on feet (R26.81);Muscle weakness (generalized) (M62.81);Pain Pain - part of body:  (abdomen)   Activity Tolerance Patient tolerated treatment well  Patient Left in chair;with call bell/phone within reach;with nursing/sitter in room;Other (comment) (No available chair alarm boxes on unit)   Nurse Communication Mobility status        Time: 0312-8118 OT Time Calculation (min): 26 min  Charges: OT General Charges $OT Visit: 1 Visit OT Treatments $Therapeutic Activity: 23-37 mins  Tifani Dack H. OTR/L Supplemental OT, Department of rehab services [phone]  Duwane Gewirtz R H. 03/04/2021, 10:48 AM

## 2021-03-04 NOTE — Plan of Care (Signed)
Pt is in good spirits. Worked with PT today sat up in chair for entire morning. Up wit 2 assist and walker. Returned to bed at lunch. Bath given. Pain medicated with oxycodone as needed. Skin overall improving. Tube feeds at goal tolerating well. Good amount of stool noted from colostomy. Wife visited today and has been supportive of husband's progress. No major concerns at this time.    Problem: Education: Goal: Knowledge of General Education information will improve Description: Including pain rating scale, medication(s)/side effects and non-pharmacologic comfort measures Outcome: Progressing   Problem: Health Behavior/Discharge Planning: Goal: Ability to manage health-related needs will improve Outcome: Progressing   Problem: Clinical Measurements: Goal: Ability to maintain clinical measurements within normal limits will improve Outcome: Progressing Goal: Will remain free from infection Outcome: Progressing Goal: Diagnostic test results will improve Outcome: Progressing Goal: Respiratory complications will improve Outcome: Progressing Goal: Cardiovascular complication will be avoided Outcome: Progressing

## 2021-03-04 NOTE — Progress Notes (Signed)
Subjective:  HD yest-  removed 1500-  tolerated well-  also had close to 1500 of UOP -  pre HD crt 3.4 and BUN 31  Objective Vital signs in last 24 hours: Vitals:   03/04/21 0332 03/04/21 0336 03/04/21 0728 03/04/21 0751  BP:  137/66 (!) 149/80   Pulse: 90 89 84 87  Resp: (!) 22 (!) 24 10 (!) 22  Temp:  98.9 F (37.2 C) 97.6 F (36.4 C)   TempSrc:  Axillary Oral   SpO2: 100% 100% 100%   Weight:      Height:       Weight change:   Intake/Output Summary (Last 24 hours) at 03/04/2021 2620 Last data filed at 03/04/2021 0500 Gross per 24 hour  Intake 3582 ml  Output 3500 ml  Net 82 ml    Assessment/ Plan: Pt is a 65 y.o. yo male with DM, HTN, colon CA and CKD who was admitted on 3/55/9741 with complications of chemo then sepsis/MSOF  Assessment/Plan: 1. Sepsis/asp PNA-  overall improved-  still on zosyn and vanc 2. ESRD -  has progressed to ESRD this admit  ?  been HD dep for 2 months but also is non oliguric and pre HD crt not bad at 3.4 with no HD for 2 days-  has TDC-  had been dialyzing TTS-  dispo is difficult due to trach and physical deconditioning.  I think I will take this opportunity to watch his trend off of dialysis -  check daily labs 3. Anemia- hgb dropping of late-  added ESA  iron stores low- will replete.   Not using heparin-  using citrate in cath ?  See hep ab wnl and normal platelets-  could we do heparin ?  Not listed as allergy - will look to add back heparin 4. Secondary hyperparathyroidism-  calc Maren Reamer OK- no binder-  PTH 39 5. HTN/volume-  no signif periph edema-  BP soft,  on midodrine but BP seems better-  will watch but may not need any longer -  on free water with low sodium-  have dec - will follow sodium with daily labs 6. Dispo-  difficult due to ESRD and trach -  possibly looking to downsize trach 7. Trach-  on trach collar-  dont know if plans being considered to downsize trach  8. Colon CA-  complicating factor-  not sure of plan-  albumin 2.1    [name]    Labs: Basic Metabolic Panel: Recent Labs  Lab 02/27/21 0901 02/28/21 0820 03/02/21 1102 03/03/21 0923  NA 134* 133* 134* 132*  K 3.6 3.4* 3.6 3.1*  CL 96* 98 97* 97*  CO2 _0 GLUCOSE 182* 101* 160* 143*  BUN 25* 33* 26* 31*  CREATININE 2.93* 3.39* 3.37* 3.44*  CALCIUM 8.7* 8.3* 8.9 8.8*  PHOS 4.1 4.1  --  3.7   Liver Function Tests: Recent Labs  Lab 02/25/21 1301 02/26/21 0348 02/27/21 0901 02/28/21 0820 03/03/21 0923  AST 16 22  --   --   --   ALT 20 22  --   --   --   ALKPHOS 92 97  --   --   --   BILITOT 1.0 0.9  --   --   --   PROT 7.1 8.0  --   --   --   ALBUMIN 2.3* 2.5* 2.5* 2.1* 2.1*   No results for input(s): LIPASE, AMYLASE in the last 168 hours.  No results for input(s): AMMONIA in the last 168 hours. CBC: Recent Labs  Lab 02/26/21 0348 02/27/21 0901 02/28/21 0730 03/02/21 1102 03/03/21 0924  WBC 23.3* 14.6* 12.6* 13.2* 13.3*  NEUTROABS 18.0* 10.9*  --   --   --   HGB 11.5* 9.2* 8.1* 9.6* 8.7*  HCT 33.5* 27.2* 24.4* 29.2* 26.1*  MCV 80.0 82.2 83.3 83.2 82.9  PLT 368 319 285 316 301   Cardiac Enzymes: No results for input(s): CKTOTAL, CKMB, CKMBINDEX, TROPONINI in the last 168 hours. CBG: Recent Labs  Lab 03/03/21 1603 03/03/21 1952 03/03/21 2352 03/04/21 0334 03/04/21 0725  GLUCAP 113* 122* 135* 128* 121*    Iron Studies:  Recent Labs    03/03/21 0924  IRON 46  TIBC 234*  FERRITIN 507*   Studies/Results: DG Swallowing Func-Speech Pathology  Result Date: 03/02/2021 Table formatting from the original result was not included. 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  Dizziness   Dyspnea   pt states d/t weight  Epilepsy ([place])   takes Keppra daily  GERD (gastroesophageal reflux disease)   takes Omeprazole daily  Headache   Hyperlipidemia   takes Atorvastatin daily  Hypertension   takes Lotrel daily  Peripheral edema   takes Lasix daily  Peripheral neuropathy   takes Gabapentin daily  Pneumonia 3 yrs ago  hx of  Seizures ([place])   Sleep apnea   Urinary frequency  Past Surgical History: Past Surgical History: Procedure Laterality Date  BIOPSY  09/05/2020  Procedure: BIOPSY;  Surgeon: Irene Shipper, MD;  Location: [facility] ENDOSCOPY;  Service: Endoscopy;;  BIOPSY  02/06/2021  Procedure: BIOPSY;  Surgeon: Jerene Bears, MD;  Location: [facility] ENDOSCOPY;  Service: Gastroenterology;;  CARDIAC CATHETERIZATION  7 yrs ago  COLONOSCOPY WITH PROPOFOL N/A 09/05/2020  Procedure: COLONOSCOPY WITH PROPOFOL;  Surgeon: Irene Shipper, MD;  Location: [facility];  Service: Endoscopy;  Laterality: N/A;  CRANIOTOMY N/A 09/01/2016  Procedure: CRANIOTOMY TUMOR  LEFT PTERIONAL;  Surgeon: Ashok Pall, MD;  Location: [facility];  Service: Neurosurgery;  Laterality: N/A;  cyst removed from chest     as a child  ESOPHAGOGASTRODUODENOSCOPY (EGD) WITH PROPOFOL N/A 02/06/2021  Procedure: ESOPHAGOGASTRODUODENOSCOPY (EGD) WITH PROPOFOL;  Surgeon: Jerene Bears, MD;  Location[facility] ENDOSCOPY;  Service: Gastroenterology;  Laterality: N/A;  IR FLUORO GUIDE CV LINE LEFT  12/22/2020  IR GASTROSTOMY TUBE MOD SED  02/10/2021  IR US GUIDE VASC ACCESS LEFT  12/22/2020  NO PAST SURGERIES    PARTIAL COLECTOMY N/A 09/10/2020  Procedure: TRANSVERSE COLECTOMY;  Surgeon: [name], MD;  Location: [place];  Service: General;  Laterality: N/A;  POLYPECTOMY  09/05/2020   Procedure: POLYPECTOMY;  Surgeon: Irene Shipper, MD;  Location: [facility];  Service: Endoscopy;;  PORTACATH PLACEMENT Right 10/01/2020  Procedure: INSERTION PORT-A-CATH;  Surgeon: [name], MD;  Location: [place];  Service: General;  Laterality: Right;  SUBMUCOSAL TATTOO INJECTION  09/05/2020  Procedure: SUBMUCOSAL TATTOO INJECTION;  Surgeon: Irene Shipper, MD;  Location: [place];  Service: Endoscopy;;  TRACHEOSTOMY TUBE PLACEMENT N/A 01/05/2021  Procedure: TRACHEOSTOMY;  Surgeon: [name], MD;  Location: [place];  Service: ENT;  Laterality: N/A; HPI: Pt is a 64 y/o male admitted on 7/25 for worsening sepsis vs reaction to chemotherapy with high ostomy output.  Pt developed worsening hypoxia and was requiring BIPAP w/ FIO2 of 100% and increased work of breathing. SLP consulted due to concern for aspiration. BSE 7/28 recommended NPO status and MBS, but this could not completed subsequently due to lethargy. CXR in AM of 7/30 showed some worsening concern of aspiration. ETT 7/30-8/8; tolerated nasal cannula for about an hour; reintubated 8/8-trach 8/11. Pt pulled out trach x2 8/22; replaced by PCCM. Bronch on 8/22 revealed endotracheal granulation tissue at trach site. Trach removed again on 8/25; unable to reinsert despite multiple attenpts; pt reintubated 8/25- trach by ENT on 8/29. BSE 8/22 limited to ice chips due to pt's refusal of other consistencies; rx NPO status with Cortrak.MRI brain 9/30: Multiple punctate foci of restricted diffusion in bilateral  frontal white matter, most likely acute infarcts. EGD 9/30:grade C reflux esophagitis/non-bleeding duodenal ulcers.Pt s/p PEG 10/4.  Trach changed to cuffed and pt transferred to ICU on 10/5 due to respiratory distress with mucous plug; bronch compled; vent 10/5-10/6. Bronch 10/5: Significant mucous plugging of the LUL with BAL and suctioning up until the subsegmental region. Trach replaced to #[id number] uncuffed. 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  Subjective: alert, cortrak removed Assessment / Plan / Recommendation CHL IP CLINICAL IMPRESSIONS 03/02/2021 Clinical Impression Pt presents with a primarily oral dysphagia with ongoing decreased tongue tip elevation with pt struggling to gather solid and liquid bolus and initaite posterior propulsion. Lingual body otherwise making contact with hard and soft palate and participating in lingual and base of tongue stripping without difficulty. Given impairment there is is piecemeal swallowing, prolonged oral phase and residue on the floor of the mouth. There are occasional instances of slight premature spillage with thin liquids, though pt tries to prevent this with brief oral hold. In the worst cases, pt has trace silent frank penetration before the swallow, though penetrate ejects and clears with subsequent swallows. Recommend puree/thin liquids with PMSV in place. Will f/u for further therapy with solids. SLP Visit Diagnosis Dysphagia, oropharyngeal phase (R13.12) Attention and concentration deficit following -- Frontal lobe and executive function deficit following -- Impact on safety and function Mild aspiration risk   CHL IP TREATMENT RECOMMENDATION 03/02/2021 Treatment Recommendations Therapy as outlined in treatment plan below   Prognosis 02/09/2021 Prognosis for Safe Diet Advancement Fair Barriers to Reach Goals Time post onset;Severity of deficits Barriers/Prognosis Comment -- CHL IP DIET RECOMMENDATION 03/02/2021 SLP Diet Recommendations Dysphagia 1 (Puree) solids;Thin liquid Liquid Administration via Cup;Straw Medication Administration Crushed with puree Compensations Slow rate;Small sips/bites Postural Changes Remain semi-upright after after  feeds/meals (Comment);Seated upright at 90 degrees   CHL IP OTHER RECOMMENDATIONS 02/09/2021 Recommended Consults -- Oral Care Recommendations Oral care QID Other Recommendations --   CHL IP FOLLOW UP RECOMMENDATIONS 03/02/2021 Follow up Recommendations Skilled Nursing facility   [facility] IP FREQUENCY AND DURATION 03/02/2021 Speech Therapy Frequency (ACUTE ONLY) min 2x/week Treatment Duration 2  weeks      CHL IP ORAL PHASE 03/02/2021 Oral Phase Impaired Oral - Pudding Teaspoon -- Oral - Pudding Cup -- Oral - Honey Teaspoon -- Oral - Honey Cup -- Oral - Nectar Teaspoon NT Oral - Nectar Cup -- Oral - Nectar Straw -- Oral - Thin Teaspoon NT Oral - Thin Cup Decreased bolus cohesion;Lingual/palatal residue;Pocketing in anterior sulcus;Reduced posterior propulsion;Incomplete tongue to palate contact;Piecemeal swallowing;Right pocketing in lateral sulci;Left pocketing in lateral sulci Oral - Thin Straw Decreased bolus cohesion;Lingual/palatal residue;Pocketing in anterior sulcus;Reduced posterior propulsion;Incomplete tongue to palate contact;Piecemeal swallowing;Right pocketing in lateral sulci;Left pocketing in lateral sulci Oral - Puree Decreased bolus cohesion;Lingual/palatal residue;Pocketing in anterior sulcus;Reduced posterior propulsion;Incomplete tongue to palate contact;Piecemeal swallowing;Right pocketing in lateral sulci;Left pocketing in lateral sulci Oral - Mech Soft Decreased bolus cohesion;Lingual/palatal residue;Pocketing in anterior sulcus;Reduced posterior propulsion;Incomplete tongue to palate contact;Piecemeal swallowing;Right pocketing in lateral sulci;Left pocketing in lateral sulci Oral - Regular NT Oral - Multi-Consistency -- Oral - Pill Decreased bolus cohesion;Lingual/palatal residue;Pocketing in anterior sulcus;Reduced posterior propulsion;Incomplete tongue to palate contact;Piecemeal swallowing;Right pocketing in lateral sulci;Left pocketing in lateral sulci Oral Phase - Comment --  CHL IP PHARYNGEAL  PHASE 03/02/2021 Pharyngeal Phase Impaired Pharyngeal- Pudding Teaspoon -- Pharyngeal -- Pharyngeal- Pudding Cup -- Pharyngeal -- Pharyngeal- Honey Teaspoon -- Pharyngeal -- Pharyngeal- Honey Cup -- Pharyngeal -- Pharyngeal- Nectar Teaspoon NT Pharyngeal -- Pharyngeal- Nectar Cup -- Pharyngeal -- Pharyngeal- Nectar Straw -- Pharyngeal -- Pharyngeal- Thin Teaspoon NT Pharyngeal -- Pharyngeal- Thin Cup Penetration/Aspiration before swallow Pharyngeal Material enters airway, CONTACTS cords and then ejected out Pharyngeal- Thin Straw Penetration/Aspiration before swallow;Trace aspiration Pharyngeal Material enters airway, CONTACTS cords and then ejected out;Material enters airway, passes BELOW cords then ejected out;Material does not enter airway Pharyngeal- Puree WFL Pharyngeal -- Pharyngeal- Mechanical Soft -- Pharyngeal -- Pharyngeal- Regular Delayed swallow initiation-pyriform sinuses Pharyngeal -- Pharyngeal- Multi-consistency -- Pharyngeal -- Pharyngeal- Pill -- Pharyngeal -- Pharyngeal Comment --  CHL IP CERVICAL ESOPHAGEAL PHASE 01/19/2021 Cervical Esophageal Phase (No Data) Pudding Teaspoon -- Pudding Cup -- Honey Teaspoon -- Honey Cup -- Nectar Teaspoon -- Nectar Cup -- Nectar Straw -- Thin Teaspoon -- Thin Cup -- Thin Straw -- Puree -- Mechanical Soft -- Regular -- Multi-consistency -- Pill -- Cervical Esophageal Comment -- DeBlois, Katherene Ponto 03/02/2021, 2:10 PM              Medications: Infusions:  sodium chloride 250 mL (02/25/21 1021)   anticoagulant sodium citrate     feeding supplement (NEPRO CARB STEADY) 1,320 mL (03/02/21 2159)   fluconazole (DIFLUCAN) IV 200 mg (03/03/21 2140)   piperacillin-tazobactam (ZOSYN)  IV 2.25 g (03/04/21 0321)   promethazine (PHENERGAN) injection (IM or IVPB) 12.5 mg (02/24/21 1950)    Scheduled Medications:  apixaban  5 mg Per Tube BID   atorvastatin  20 mg Oral QHS   chlorhexidine  15 mL Mouth Rinse BID   Chlorhexidine Gluconate Cloth  6 each Topical  Q0600   Chlorhexidine Gluconate Cloth  6 each Topical Q0600   clonazePAM  0.5 mg Per Tube BID   [START ON 03/05/2021] darbepoetin (ARANESP) injection - DIALYSIS  60 mcg Intravenous Q Thu-HD   diclofenac Sodium  4 g Topical QID   fiber  1 packet Per Tube BID   free water  200 mL Per Tube Q12H   gabapentin  100 mg Per Tube QHS   hydrocerin   Topical BID   insulin aspart  0-15 Units Subcutaneous Q4H   levETIRAcetam  500 mg Per  Tube BID   loperamide HCl  1 mg Per Tube Daily   mouth rinse  15 mL Mouth Rinse q12n4p   midodrine  10 mg Per Tube Q T,Th,Sa-HD   nystatin  5 mL Oral QID   pantoprazole sodium  40 mg Per Tube Daily    have reviewed scheduled and prn medications.  Physical Exam: General: alert, NAD-  looks chronically ill-  tearful-  wants to go home-  says has some gas after eating ice cream last night Heart: RRR Lungs: CBS bilat Abdomen: obese-  feeding tube Extremities: min perpipheral edema Dialysis Access: left TDC -  non tender    03/04/2021,8:33 AM  LOS: 93 days

## 2021-03-04 NOTE — Progress Notes (Signed)
[facility] PROGRESS NOTE  Thomas [name] CWC:[id number] DOB: 1955-12-28 DOA: 11/30/2020 PCP: [name], NP   03/03/2021  Status: Remains inpatient appropriate because: Persistent severe electrolyte disturbances, Unsafe d/c plan, and Inpatient level of care appropriate due to severity of illness  Barriers to discharge:  Currently has tracheostomy in place and is requiring dialysis but remains too weak to tolerate dialysis in chair which is a requirement for outpatient HD  Level of care:  Progressive  Code Status: Full Family communication: Wife on 10/26 at the bedside DVT prophylaxis: Eliquis COVID vaccination status: Moderna 07/08/2019 and 08/11/2019 with first booster dose given 08/05/2020   HPI:  65 y.o. male with a history of T2DM with neuropathy, HTN, HLD., colon cancer s/p resection and colostomy May 2022 s/p 3 cycles chemotherapy (last was July 2022). Followed by Dr. Benay Spice. Started CAPOX June 2022, reduced cepecitabine dose starting 11/19/2020 due to hand/foot syndrome, reduced oxaliplatin due to renal impairment and poor performance status. Presented to the ED 7/24 with abdominal pain found to be febrile, tachycardic, tachypneic. Lactic acid was 6.6 and persisted at 6.8 after sepsis IV fluid bolus and cefepime. He had ARF, demand ischemia, bandemia, was hyperglycemic with negative ketones, with lactic acidosis. He developed AFib with RVR in the ED, was started on diltiazem, and was admitted to the ICU for septic shock with unclear source. Sepsis was subsequently felt to be ruled out at time of admission, presentation felt more likely to be related to cepacitabine toxicity with oral mucositis, hand/foot syndrome, and diarrhea/high output colostomy.   He was transferred to hospitalist service 7/27 but developed metabolic encephalopathy with hyoxia due to aspiration pneumonia on 7/30 requiring intubation and pressor support. Failed extubation with inability to clear secretions,  requiring reintubation 8/8, and tracheostomy subsequently placed 8/11. This was pulled out 8/22, replaced under bronch guidance, again removed 8/25 requiring reintubation after failed reinsertion. ENT revised tracheostomy in the OR 8/29. He has required IHD for ARF, and also had complications of RLE DVT now on eliquis, colitis thought to be ischemic 8/2 not requiring specific intervention, Pseudomonas pneumonia having completed treatment, agitated encephalopathy which has improved, anemia requiring 4u PRBCs total, and thrombocytopenia which has resolved. On 9/4 he was transferred to hospitalist service having tolerated trach collar.  Patient was also evaluated by Dr. Benay Spice during this admission clarified that patient was receiving curative chemotherapy prior to admission  Subsequently patient has had issues with bronchial plugging requiring urgent bronchoscopy and transient readmission to the ICU.  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  He has had episodes of aspiration pneumonitis.  He has had recurrent urinary retention despite several attempts to remove Foley therefore Foley remains in place.  He has had 2 episodes of funguria.  Gladly, as of 10/26 patient has made significant improvements in status and he is able to talk and swallowing is greatly improved as well as tongue mobility.  Trach team considering capping trials soon and nephro is watching patient off of dialysis.   Subjective:  Alert.  Sitting up in bed.  Very pleased by his progress.  Wife read documentation regarding patient's up-and-down status over the past several weeks emphasizing how faith has influence both her and her husband's response to his illness with specifics regarding a word from God about patient's overall healing.  Objective: Vitals:   03/04/21 0336 03/04/21 0728  BP: 137/66 (!) 149/80  Pulse: 89 84  Resp: (!) 24  10  Temp: 98.9 F (37.2  C) 97.6 F (36.4 C)  SpO2: 100% 100%    Intake/Output Summary (Last 24 hours) at 03/04/2021 0741 Last data filed at 03/04/2021 0500 Gross per 24 hour  Intake 3582 ml  Output 3500 ml  Net 82 ml     Filed Weights   03/02/21 0431 03/03/21 0904 03/03/21 1320  Weight: 105.9 kg 105.8 kg 104.4 kg    Exam: Constitutional: Alert, no acute distress Respiratory: #6 XLT cuffless trach, RA to TC, PMV in place.  Lung sounds CTA, oxygen saturations 9%, no increased work of breathing at rest Cardiac: Heart sounds S1-S2, regular pulse without tachycardia, no peripheral edema, skin warm and dry, normotensive Abdomen: Abdomen soft, nontender, PEG site unremarkable, normoactive bowel sounds.  Colostomy collection bag with light brown liquid stool  Genitourinary: Foley in place with yellow urine noted Neurologic: CN 2-12 grossly intact.  Strength of both upper and lower extremities estimated 3/5.   Psychiatric: Awakens, oriented x3 at present.  Pleasant affect.   Assessment/Plan: Acute problems: Sepsis with hypotension -Suspect aspiration pneumonia sewed of postdialysis emesis -Sepsis physiology has resolved -Recurrent funguria-Diflucan until 10/30  Volume depletion with hypotension -Due to excessive high volume liquid stools patient developed hypotension that responded to fluid challenges -Continue scheduled Imodium daily -noting volume over the past 24 hours 550 cc -Continue Nutrisource fiber per tube   Acute hypoxic respiratory failure s/p tracheostomy due to aspiration pneumonia /Recurrent pansensitive Pseudomonas tracheobronchitis with mucous plugging on 10/7 -Status post bronchoscopy  -Tolerating PMV with SLP following-trach team considering capping trials later this week -Recent episode of aspiration pneumonitis -has completed 6 days of Zosyn  Symptomatic anemia/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  Wife and patient wish to continue aggressive treatment  Grade LA C reflux esophagitis/nonbleeding duodenal ulcers -Continue PPI for total of 4-weeks (initiated 9/29) -Follow-up EGD recommended after discharge  Inability to elevate tongue with associated oral dysphagia  -Neurology has documented issues not related to recent stroke or recurrent intubation -SLP also noted during MBS that with attempts at swallowing patient was unable to move muscles and neck and there was no visible movement of hyoid bone on fluoroscopy -10/24 SLP/MBS recommended dysphagia 1 diet with thin liquids.  Urinary retention -Did not tolerate discontinuation of Foley catheter so reinserted on 10/10 (Foley catheter has been discontinued a total of 3 times this admission with recurrent urinary retention) -Will need eventual follow-up with urology  Dysphagia/protein calorie malnutrition -PEG placed 10/4 -Continue tube feedings-increase free water to 200 cc every 6 hours given continued GI losses from colostomy -MBS completed on 10/24  MRI with bilateral punctate lesion (Frontal watershed) -Stroke team differential included thrombotic etiology from either A. 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 -CBGs well controlled with SSI alone.  Continue to hold Levemir for now -Gabapentin resumed   HTN:  -BP controlled off of Norvasc   HLD:  -Resume atorvastatin      Pressure Injury 02/05/21 Coccyx Lower Stage 2 -  Partial thickness loss of dermis presenting as a shallow open injury with a red, pink wound bed without slough. healing quarter sized spot on sacrum (Active)  Date First Assessed/Time First Assessed: 02/05/21 0800   Location: Coccyx  Location Orientation: Lower  Staging: Stage 2 -  Partial thickness loss of dermis presenting as a shallow open injury with a red, pink wound bed without slough.  Wound Descript...    Assessments 02/05/2021  5:10 PM 03/02/2021  8:00 AM  Dressing Type Foam - Lift dressing to assess site every shift Foam - Lift dressing to assess site every shift  Dressing Changed Intact  Dressing Change Frequency -- PRN  State of  Healing Early/partial granulation --  Site / Wound Assessment Pink --  % Wound base Red or Granulating 100% --  Wound Length (cm) 2 cm --  Wound Width (cm) 2 cm --  Wound Surface Area (cm^2) 4 cm^2 --  Margins Attached edges (approximated) --  Drainage Amount None --  Treatment Cleansed --     No Linked orders to display     Data Reviewed: Basic Metabolic Panel: Recent Labs  Lab 02/26/21 0348 02/27/21 0901 02/28/21 0820 03/02/21 1102 03/03/21 0923  NA 134* 134* 133* 134* 132*  K 3.8 3.6 3.4* 3.6 3.1*  CL 97* 96* 98 97* 97*  CO2 17* 27 26 26 26   GLUCOSE 111* 182* 101* 160* 143*  BUN 50* 25* 33* 26* 31*  CREATININE 4.02* 2.93* 3.39* 3.37* 3.44*  CALCIUM 9.3 8.7* 8.3* 8.9 8.8*  PHOS  --  4.1 4.1  --  3.7   Liver Function Tests: Recent Labs  Lab 02/25/21 1301 02/26/21 0348 02/27/21 0901 02/28/21 0820 03/03/21 0923  AST 16 22  --   --   --   ALT 20 22  --   --   --   ALKPHOS 92 97  --   --   --   BILITOT 1.0 0.9  --   --   --   PROT 7.1 8.0  --   --   --   ALBUMIN 2.3* 2.5* 2.5* 2.1* 2.1*    CBC: Recent Labs  Lab 02/26/21 0348 02/27/21 0901 02/28/21 0730 03/02/21 1102 03/03/21 0924  WBC 23.3* 14.6* 12.6*  13.2* 13.3*  NEUTROABS 18.0* 10.9*  --   --   --   HGB 11.5* 9.2* 8.1* 9.6* 8.7*  HCT 33.5* 27.2* 24.4* 29.2* 26.1*  MCV 80.0 82.2 83.3 83.2 82.9  PLT 368 319 285 316 301    CBG: Recent Labs  Lab 03/03/21 1603 03/03/21 1952 03/03/21 2352 03/04/21 0334 03/04/21 0725  GLUCAP 113* 122* 135* 128* 121*    Scheduled Meds:  apixaban  5 mg Per Tube BID   atorvastatin  20 mg Oral QHS   chlorhexidine  15 mL Mouth Rinse BID   Chlorhexidine Gluconate Cloth  6 each Topical Q0600   Chlorhexidine Gluconate Cloth  6 each Topical Q0600   clonazePAM  0.5 mg Per Tube BID   [START ON 03/05/2021] darbepoetin (ARANESP) injection - DIALYSIS  60 mcg Intravenous Q Thu-HD   diclofenac Sodium  4 g Topical QID   fiber  1 packet Per Tube BID   free water  200 mL Per  Tube Q12H   gabapentin  100 mg Per Tube QHS   hydrocerin   Topical BID   insulin aspart  0-15 Units Subcutaneous Q4H   levETIRAcetam  500 mg Per Tube BID   loperamide HCl  1 mg Per Tube Daily   mouth rinse  15 mL Mouth Rinse q12n4p   midodrine  10 mg Per Tube Q T,Th,Sa-HD   nystatin  5 mL Oral QID   pantoprazole sodium  40 mg Per Tube Daily   Continuous Infusions:  sodium chloride 250 mL (02/25/21 1021)   anticoagulant sodium citrate     feeding supplement (NEPRO CARB STEADY) 1,320 mL (03/02/21 2159)   fluconazole (DIFLUCAN) IV 200 mg (03/03/21 2140)   piperacillin-tazobactam (ZOSYN)  IV 2.25 g (03/04/21 0321)   promethazine (PHENERGAN) injection (IM or IVPB) 12.5 mg (02/24/21 1950)    Principal Problem:   Diabetes mellitus with hyperosmolarity without hyperglycemic hyperosmolar nonketotic coma (HCC) Active Problems:   Lactic acidosis   Acute renal failure superimposed on stage 2 chronic kidney disease (HCC)   Metabolic acidosis   Tachycardia   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 (HCC)   Adverse effect of chemotherapy   Pressure injury of skin   Atrial fibrillation with RVR (HCC)   Sepsis (HCC)   Malnutrition of moderate degree   Acute metabolic encephalopathy   Acute respiratory failure with hypoxemia (HCC)   Acute renal failure with acute cortical necrosis (HCC)   Shock ([place])   Status post tracheostomy ([place])   Endotracheal tube present   Hemodialysis status ([place])   Palliative care by specialist   SOB (shortness of breath)   Non-intractable vomiting   Abdominal pain, epigastric   Cerebral embolism with cerebral infarction   Acute esophagitis   Duodenal ulcer disease   Consultants: PCCM General surgery Nephrology Hematology/oncology Palliative care medicine team  Procedures: 2u PRBC 7/31, 1u PRBC 8/17, 1u PRBC 8/27 Tracheostomy revision 01/05/2021 Dr. Constance Holster [facility] 8/22 10/7 briefly changed to cuffed trach to allow for short-term  mechanical ventilation and subsequently changed back to cuffless trach on 10/8    Antibiotics: Vancomycin 7/24 Cefepime 7/24 - 7/27 Unasyn 7/30 >> cefepime/flagyl 7/31 - 8/6  Cefazolin x1 dose on 10/ Zosyn 10/5 through 10/9 Vancomycin 10/5 through 10/6 10/19 Zosyn >>    Time spent: 40 minutes    [name] ANP  [facility] 7 am - 330 pm/M-F for direct patient care and secure chat Please refer to Amion for contact info 93  days

## 2021-03-04 NOTE — Progress Notes (Signed)
Speech Language Pathology Treatment: Dysphagia  Patient Details Name: Thomas [name] MRN: [id number] DOB: 08/01/1955 Today's Date: 03/04/2021 Time: 5449-2010 SLP Time Calculation (min) (ACUTE ONLY): 9 min  Assessment / Plan / Recommendation Clinical Impression  Pt demonstrates tolerance of thin liquids and puree with ongoing effortful oral manipulation. Introduced some exercises targeting lingual ROM and elevation. Pt able to complete 2 exercises x10 with tactile and verbal cues to lower mandible, but lift tongue. Pt not very interested in PO intake at this time. Will continue efforts.   HPI HPI: Pt is a 65 y/o male admitted on 7/25 for worsening sepsis vs reaction to chemotherapy with high ostomy output.  Pt developed worsening hypoxia and was requiring BIPAP w/ FIO2 of 100% and increased work of breathing. SLP consulted due to concern for aspiration. BSE 7/28 recommended NPO status and MBS, but this could not completed subsequently due to lethargy. CXR in AM of 7/30 showed some worsening concern of aspiration. ETT 7/30-8/8; tolerated nasal cannula for about an hour; reintubated 8/8-trach 8/11. Pt pulled out trach x2 8/22; replaced by PCCM. Bronch on 8/22 revealed endotracheal granulation tissue at trach site. Trach removed again on 8/25; unable to reinsert despite multiple attenpts; pt reintubated 8/25- trach by ENT on 8/29. BSE 8/22 limited to ice chips due to pt's refusal of other consistencies; rx NPO status with Cortrak.MRI brain 9/30: Multiple punctate foci of restricted diffusion in bilateral  frontal white matter, most likely acute infarcts. EGD 9/30:grade C reflux esophagitis/non-bleeding duodenal ulcers.Pt s/p PEG 10/4.  Trach changed to cuffed and pt transferred to ICU on 10/5 due to respiratory distress with mucous plug; bronch compled; vent 10/5-10/6. Bronch 10/5: Significant mucous plugging of the LUL with BAL and suctioning up until the subsegmental region. Trach replaced to #[id number] uncuffed.  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      SLP Plan  Continue with current plan of care      Recommendations for follow up therapy are one component of a multi-disciplinary discharge planning process, led by the attending physician.  Recommendations may be updated based on patient status, additional functional criteria and insurance authorization.    Recommendations  Diet recommendations: Thin liquid;Dysphagia 1 (puree) Liquids provided via: Cup;Straw Medication Administration: Via alternative means Supervision: Patient able to self feed Compensations: Slow rate;Small sips/bites Postural Changes and/or Swallow Maneuvers: Seated upright 90 degrees                Follow up Recommendations: Skilled Nursing facility SLP Visit Diagnosis: Dysphagia, oropharyngeal phase (R13.12) Plan: Continue with current plan of care       GO                Rosanna Bickle, Katherene Ponto  03/04/2021, 10:52 AM

## 2021-03-04 NOTE — Progress Notes (Signed)
03/04/2021   I have seen and evaluated the patient for trach care.  S:  Doing well with PMV this morning. Wearing with po.  Wife at bedside. Breathing feels ok after downsize to 4.0 cuffless shiley.   O: Blood pressure 124/74, pulse 97, temperature 98 F (36.7 C), temperature source Axillary, resp. rate 20, height 5\' 8"  (1.727 m), weight 104.4 kg, SpO2 99 %.  Sitting up in chair, chronically ill appearing 4.0 shiley cuffless in place wearing PMV Lungs clear Peg in place   A:  Recurrent aspirations and mucus plugging leading to tracheostomy Advanced colon cancer post colectomy Deconditioning Hx DVT Recurrent urinary retention ESRD  P:  Continue 4.0 cuffless trach with PMV and SLP.  Can start capping trials now.  Would continue these over the weekend and re-evaluate next week for decannulation Curious about how his SLP and PO intake goes because if risk for aspiration is high, that worries me for decannulation.     Spero Geralds [facility] 03/04/2021 11:33 AM  Pager: see AMION  If no response to pager , please call critical care on call (see AMION) until 7pm After 7:00 pm call Elink

## 2021-03-05 DIAGNOSIS — E1100 Type 2 diabetes mellitus with hyperosmolarity without nonketotic hyperglycemic-hyperosmolar coma (NKHHC): Secondary | ICD-10-CM | POA: Diagnosis not present

## 2021-03-05 LAB — GLUCOSE, CAPILLARY
Glucose-Capillary: 141 mg/dL — ABNORMAL HIGH (ref 70–99)
Glucose-Capillary: 129 mg/dL — ABNORMAL HIGH (ref 70–99)
Glucose-Capillary: 116 mg/dL — ABNORMAL HIGH (ref 70–99)
Glucose-Capillary: 102 mg/dL — ABNORMAL HIGH (ref 70–99)

## 2021-03-05 LAB — RENAL FUNCTION PANEL
Albumin: 2.2 g/dL — ABNORMAL LOW (ref 3.5–5.0)
Anion gap: 7 (ref 5–15)
BUN: 22 mg/dL (ref 8–23)
CO2: 28 mmol/L (ref 22–32)
Calcium: 8.9 mg/dL (ref 8.9–10.3)
Chloride: 99 mmol/L (ref 98–111)
Creatinine, Ser: 2.91 mg/dL — ABNORMAL HIGH (ref 0.61–1.24)
GFR, Estimated: 23 mL/min — ABNORMAL LOW (ref >60)
Glucose, Bld: 150 mg/dL — ABNORMAL HIGH (ref 70–99)
Phosphorus: 2.4 mg/dL — ABNORMAL LOW (ref 2.5–4.6)
Potassium: 3.5 mmol/L (ref 3.5–5.1)
Sodium: 134 mmol/L — ABNORMAL LOW (ref 135–145)

## 2021-03-05 MED ORDER — NEPRO/CARBSTEADY PO LIQD
1000.0000 mL | ORAL | Status: DC
  Administered 2021-03-05 – 2021-03-07 (×4): 1000 mL
  Filled 2021-03-05 (×7): qty 1000

## 2021-03-05 MED ORDER — SODIUM BICARBONATE 650 MG PO TABS
650.0000 mg | ORAL_TABLET | Freq: Once | ORAL | Status: AC
  Administered 2021-03-05: 650 mg
  Filled 2021-03-05: qty 1

## 2021-03-05 MED ORDER — PANCRELIPASE (LIP-PROT-AMYL) 10440-39150 UNITS PO TABS
20880.0000 [IU] | ORAL_TABLET | Freq: Once | ORAL | Status: AC
  Administered 2021-03-05: 20880 [IU]
  Filled 2021-03-05: qty 2

## 2021-03-05 NOTE — Progress Notes (Signed)
Occupational Therapy Treatment Patient Details Name: Thomas [name] MRN: [id number] DOB: 1956/04/21 Today's Date: 03/05/2021   History of present illness 65 y.o. male admitted 7/24 with high ostomy output. Pt with severe sepsis and multisystem organ failure. Hospital admission complicated by volume overload, aspiration pneumonia, acute respiratory failure, AKI and ischemic colitis. Intubated 7/30. S/p trach placement 8/11. [facility]Trak 8/12. HD initiated 8/12. To IR 8/15 for tunneled HD cath. Pulled out trach x2 8/22 replaced by PCCM. Pulled out trach again 8/25 but unable to be reinserted with pt intubated. 8/29 to OR for stoma clean up and trach replacement.Trach downsized 9/10. 10/5 respiratory distress with mucous plug requiring bronch, trach change and ventilator 10/5-10/6.  10/6 transitioned to trach collar, 10/7 pulled trach and inserted by RT with no complications. MD held HD 10/26 given increased urine output. 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   OT comments  Patient received in recliner.  Patient states nursing was able to assist him to chair. Patient was led in BUE strengthening exercises to increase sit to stands, UE strength, and transfers. Standing from recliner was attempted without success and patient performed wheelchair pushup to address with patient unable to clear chair.  Acute OT to continue to follow.    Recommendations for follow up therapy are one component of a multi-disciplinary discharge planning process, led by the attending physician.  Recommendations may be updated based on patient status, additional functional criteria and insurance authorization.    Follow Up Recommendations  Skilled nursing-short term rehab (<3 hours/day)    Assistance Recommended at  Discharge Frequent or constant Supervision/Assistance  Equipment Recommendations  Other (comment)    Recommendations for Other Services Other (comment)    Precautions / Restrictions Precautions Precautions: Fall Precaution Comments: R subclavian tunneled HD cath, ostomy, peg, trach Restrictions Weight Bearing Restrictions: No       Mobility Bed Mobility               General bed mobility comments: in recliner upon arrival    Transfers Overall transfer level: Needs assistance                 General transfer comment: attempted standing from recliner and patient was unable to clear bottom from chair.  performed wheelchair pushups to address     Balance Overall balance assessment: Needs assistance Sitting-balance support: No upper extremity supported;Feet supported Sitting balance-Leahy Scale: Fair Sitting balance - Comments: sitting in recliner                                   ADL either performed or assessed with clinical judgement   ADL Overall ADL's : Needs assistance/impaired     Grooming: Minimal assistance;Sitting Grooming Details (indicate cue type and reason): wash face and hands seated in recliner                               General ADL Comments: performed seated in recliner.     Vision       Perception     Praxis      Cognition Arousal/Alertness: Awake/alert Behavior During Therapy: Flat affect Overall Cognitive Status: Impaired/Different from baseline Area of Impairment: Orientation;Memory;Following commands;Safety/judgement;Awareness;Problem solving  Orientation Level: Disoriented to;Time Current Attention Level: Sustained Memory: Decreased short-term memory Following Commands: Follows one step commands with increased time Safety/Judgement: Decreased awareness of deficits Awareness: Intellectual Problem Solving: Slow processing General Comments: patient able to follow commands  for HEP          Exercises Exercises: General Upper Extremity General Exercises - Upper Extremity Shoulder Flexion: AROM;Both;10 reps;Seated Shoulder Extension: Strengthening;Both;10 reps;Theraband Theraband Level (Shoulder Extension): Level 1 (Yellow) Shoulder ABduction: Strengthening;Both;10 reps;Supine;Theraband Theraband Level (Shoulder Abduction): Level 1 (Yellow) Elbow Flexion: Strengthening;Both;15 reps;Seated;Theraband Theraband Level (Elbow Flexion): Level 1 (Yellow) Elbow Extension: Strengthening;Both;15 reps;Seated;Theraband Theraband Level (Elbow Extension): Level 1 (Yellow) Chair Push Up: Strengthening;Both;10 reps;Other (comment) (patient unable to clear chair)   Shoulder Instructions       General Comments      Pertinent Vitals/ Pain       Pain Assessment: Faces Faces Pain Scale: Hurts even more Breathing: normal Negative Vocalization: none Facial Expression: sad, frightened, frown Body Language: relaxed Consolability: no need to console PAINAD Score: 1 Facial Expression: Relaxed, neutral Body Movements: Absence of movements Muscle Tension: Relaxed Compliance with ventilator (intubated pts.): Coughing but tolerating Vocalization (extubated pts.): Talking in normal tone or no sound CPOT Total: 1 Pain Location: sacral Pain Descriptors / Indicators: Grimacing Pain Intervention(s): Repositioned;Monitored during session  Home Living                                          Prior Functioning/Environment              Frequency  Min 2X/week        Progress Toward Goals  OT Goals(current goals can now be found in the care plan section)  Progress towards OT goals: Progressing toward goals  Acute Rehab OT Goals OT Goal Formulation: With patient Time For Goal Achievement: 03/18/21 Potential to Achieve Goals: Good ADL Goals Pt Will Perform Grooming: with set-up;sitting Pt Will Perform Upper Body Dressing: with  set-up;sitting Pt Will Perform Lower Body Dressing: with mod assist;sit to/from stand Pt Will Transfer to Toilet: with supervision;ambulating;bedside commode Pt Will Perform Toileting - Clothing Manipulation and hygiene: with supervision;sit to/from stand Pt Will Perform Tub/Shower Transfer: Tub transfer;Shower transfer;3 in Energy manager will Perform Home Exercise Program: Increased ROM;Increased strength;Both right and left upper extremity Additional ADL Goal #1: Patient will perform bed mobility with Min A in prep for ADLs. Additional ADL Goal #2: [place] will complete sit to stand transfers with Mod A +2 in prep for ADLs. Additional ADL Goal #3: Patient will attend to 2/3 grooming tasks without cues for continuation indicating increased attention to task.  Plan Discharge plan remains appropriate;Frequency remains appropriate    Co-evaluation                 AM-PAC OT "6 Clicks" Daily Activity     Outcome Measure   Help from another person eating meals?: A Little Help from another person taking care of personal grooming?: A Little Help from another person toileting, which includes using toliet, bedpan, or urinal?: Total Help from another person bathing (including washing, rinsing, drying)?: Total Help from another person to put on and taking off regular upper body clothing?: A Lot Help from another person to put on and taking off regular lower body clothing?: Total 6 Click Score: 11    End of Session Equipment Utilized During Treatment: Oxygen  OT Visit Diagnosis: Unsteadiness  on feet (R26.81);Muscle weakness (generalized) (M62.81);Pain   Activity Tolerance Patient tolerated treatment well   Patient Left in chair;with call bell/phone within reach;with chair alarm set   Nurse Communication Other (comment) (exercise program)        Time: 2867-5198 OT Time Calculation (min): 22 min  Charges: OT General Charges $OT Visit: 1 Visit OT  Treatments $Therapeutic Exercise: 8-22 mins  Lodema Hong, [place]  Pager [phone] Office [place] 03/05/2021, 12:20 PM

## 2021-03-05 NOTE — Progress Notes (Signed)
  Asked to evaluate clogged Gtube.  Gastrostomy tube clogged with what appears to be dried tube feeds or maybe dried crushed medications.  I used a Gtube de-clogging device and I was successful at [employer] the Gtube.  I flushed it 4 times with warm tap water. No issues.  Ok to use to [place]   Make sure to flush tube after feedings and after medications.  Krist Rosenboom S Jamaurion Slemmer PA-C 03/05/2021 11:29 AM

## 2021-03-05 NOTE — Progress Notes (Signed)
Nutrition Follow-up  DOCUMENTATION CODES:   Non-severe (moderate) malnutrition in context of chronic illness, Obesity unspecified  INTERVENTION:  Continue TF via PEG tube: - Nepro @ 55 ml/hr (1320 ml/day) - ProSource TF 45 ml BID -Free water flushes per MD, currently 258m Q6H  Tube feeding regimen provides 2456 kcal, 129 grams of protein, and 950 ml of H2O (17571mtotal free water)   NUTRITION DIAGNOSIS:   Moderate Malnutrition related to chronic illness, cancer and cancer related treatments as evidenced by mild fat depletion, mild muscle depletion, percent weight loss.  Ongoing, being addressed via TF  GOAL:   Patient will meet greater than or equal to 90% of their needs  Met via TF  MONITOR:   Diet advancement, Labs, Weight trends, TF tolerance, I & O's, Skin  REASON FOR ASSESSMENT:   Consult Enteral/tube feeding initiation and management  ASSESSMENT:   [age].o. male who is a former with medical history of stage 3 colon cancer s/p partial colectomy and end colostomy on 5/4, recent chemo start with 3rd cycle on 7/22, sleep apnea, seizures, epilepsy, brain tumor, HTN, HLD, GERD, asthma, peripheral neuropathy, urinary frequency, DM, MCA stroke. He presented to the ED via EMS due to abdominal pain x2 weeks, anorexia, and weakness. Admitted for close monitoring for concern of mesenteric ischemia. Patient reported that abdominal pain was worse with eating PTA.  07/31 - CRRT initiated 08/09 - CRRT stopped  [facility] - s/p trach 08/12 - Cortrak placed (tip of tube in stomach) [facility] out, replaced under bronch guidance, s/p bronch revealing mild area of granulation tissue of top of trach tube 08/25 - pulled trach out, transferred to ICU, emergently intubated 08/29 - trach replaced with ENT in OR 09/04 - tx out of ICU 09/09 - changed to cuffless trach 09/22 - diet advanced to  clears 09/23 - made NPO  09/27 - TF held 2/2 emesis 09/30 - s/p EGD which revealed grade LA C reflux esophagitis/non-bleeding duodenal ulcers 10/03 - Cortrak removed, pt failed MBSS 10/04 - s/p PEG placement 10/05 - pt placed on vent support, transfer to ICU, s/p bronch showing significant mucus plugging 10/06 - back on trach collar 10/07 - trach changed 10/10 - tx back to floor  Discussed pt with RN who reports pt's PEG had been clogged but IR able to unclog tube today. Recommend TF be resumed. Current TF orders: Nepro @ 5535mr, 64m74mosource Plus BID, 200ml53me water Q6H.  Pt is on dysphagia 1 diet with thin liquids at this time but has had minimal po intake. Recommend TF continue to meet 100% of pt's needs until PO intake improves.   Nephrology continues to follow. Plan at this time is to watch pt's trend off dialysis. Last HD 10/25 and per Nephrology pt without need for HD today.   Pt noted to have made significant improvements in mental status and his speech/swallowing have greatly improved per NP. Trach team now considering initiating capping trials soon.   Pt had HD 10/25 with 1.5L net UF Admit weight: 111.8 kg Current weight: 104.4 kg  Medications: aranesp, nutrisource fiber BID, keppra, SSI q 4 hours, imodium, protonix, ferrlecit Labs: Na 134 (L) CBGs: 116-162 x 24 hours  UOP: 250ml 74mhours Colostomy output: 400ml x73mours I/Os: -24.6L since admit  Diet Order:   Diet Order  DIET - DYS 1 Room service appropriate? No; Fluid consistency: Thin  Diet effective now                   EDUCATION NEEDS:   No education needs have been identified at this time  Skin:  Skin Assessment: Skin Integrity Issues: Stage II: coccyx  Last BM:  10/26 422m via colostomy  Height:   Ht Readings from Last 1 Encounters:  01/01/21 _0  (1.727 m)    Weight:   Wt Readings from Last 1 Encounters:  03/03/21 104.4 kg    Ideal Body Weight:  70 kg  BMI:   Body mass index is 35 kg/m.  Estimated Nutritional Needs:   Kcal:  [address] Protein:  120-140 gm  Fluid:  1 L + UOP    ALarkin Ina MS, RD, LDN (she/her/hers) RD pager number and weekend/on-call pager number located in [place]

## 2021-03-05 NOTE — Progress Notes (Signed)
CSW and RN CM met with patient at bedside to discuss discharge planning. Patient agreeable to short term rehab after medically cleared for discharge - did not state preferences on any facility. Patient anxious to get home to reunite with his grandchildren. Patient pleasant and calm throughout interaction, also mentioning his wife is on her way to visit him. All questions answered - encouraged patient to reach out if any needs arise.  Madilyn Fireman, MSW, LCSW Transitions of Care  Clinical Social Worker II [phone]

## 2021-03-05 NOTE — Progress Notes (Signed)
Subjective:  at least 250 of UOP -  crt 2.91   2 days after HD Objective Vital signs in last 24 hours: Vitals:   03/05/21 0312 03/05/21 0351 03/05/21 0754 03/05/21 0800  BP:  (!) 121/59  121/71  Pulse: 78 89 85 92  Resp: 16 19 18 18   Temp:  98.1 F (36.7 C)  97.8 F (36.6 C)  TempSrc:  Axillary  Axillary  SpO2: 100% 99% 99% 97%  Weight:      Height:       Weight change:   Intake/Output Summary (Last 24 hours) at 03/05/2021 0944 Last data filed at 03/04/2021 1800 Gross per 24 hour  Intake 1393.73 ml  Output 650 ml  Net 743.73 ml    Assessment/ Plan: Pt is a 65 y.o. yo male with DM, HTN, colon CA and CKD who was admitted on 0/17/5102 with complications of chemo then sepsis/MSOF  Assessment/Plan: 1. Sepsis/asp PNA-  overall improved-  still on zosyn and vanc 2. ESRD -  has progressed to ESRD this admit  ?  been HD dep for 2 months but also is non oliguric and pre HD crt on 10/25 was not bad at 3.4 with no HD for 2 days-  has TDC-  had been dialyzing TTS-  dispo is difficult due to trach and physical deconditioning.  I think I will take this opportunity to watch his trend off of dialysis, last HD 10/25 -  check daily labs-  no need for HD today 3. Anemia- hgb dropping of late-  added ESA  and IV iron.    Not using heparin-  using citrate in cath ?  See hep ab wnl and normal platelets-  could we do heparin ?  Not listed as allergy - will look to add back heparin if need to do HD again 4. Secondary hyperparathyroidism-  calc Maren Reamer OK- no binder-  PTH 39 5. HTN/volume-  no signif periph edema-  BP soft,  on midodrine but BP seems better-  will watch but continue -  on free water with low sodium-  have dec - will follow sodium with daily labs 6. Dispo-  difficult due to ESRD and trach -  possibly looking to downsize trach-  if he could come off of HD that opens up other possibilities for dispo  7. Trach-  on trach collar-  looking to downsize trach  8. Colon CA-  complicating factor-  not  sure of plan-  albumin 2.1   [name]    Labs: Basic Metabolic Panel: Recent Labs  Lab 02/28/21 0820 03/02/21 1102 03/03/21 0923 03/05/21 0543  NA 133* 134* 132* 134*  K 3.4* 3.6 3.1* 3.5  CL 98 97* 97* 99  CO2 26 26 26 28   GLUCOSE 101* 160* 143* 150*  BUN 33* 26* 31* 22  CREATININE 3.39* 3.37* 3.44* 2.91*  CALCIUM 8.3* 8.9 8.8* 8.9  PHOS 4.1  --  3.7 2.4*   Liver Function Tests: Recent Labs  Lab 02/28/21 0820 03/03/21 0923 03/05/21 0543  ALBUMIN 2.1* 2.1* 2.2*   No results for input(s): LIPASE, AMYLASE in the last 168 hours. No results for input(s): AMMONIA in the last 168 hours. CBC: Recent Labs  Lab 02/27/21 0901 02/28/21 0730 03/02/21 1102 03/03/21 0924  WBC 14.6* 12.6* 13.2* 13.3*  NEUTROABS 10.9*  --   --   --   HGB 9.2* 8.1* 9.6* 8.7*  HCT 27.2* 24.4* 29.2* 26.1*  MCV 82.2 83.3 83.2 82.9  PLT  319 285 316 301   Cardiac Enzymes: No results for input(s): CKTOTAL, CKMB, CKMBINDEX, TROPONINI in the last 168 hours. CBG: Recent Labs  Lab 03/04/21 1703 03/04/21 1934 03/04/21 2346 03/05/21 0351 03/05/21 0755  GLUCAP 162* 146* 129* 141* 129*    Iron Studies:  Recent Labs    03/03/21 0924  IRON 46  TIBC 234*  FERRITIN 507*   Studies/Results: No results found. Medications: Infusions:  sodium chloride 250 mL (02/25/21 1021)   anticoagulant sodium citrate     feeding supplement (NEPRO CARB STEADY) 1,000 mL (03/05/21 2263)   ferric gluconate (FERRLECIT) IVPB Stopped (03/04/21 1220)   promethazine (PHENERGAN) injection (IM or IVPB) 12.5 mg (02/24/21 1950)    Scheduled Medications:  apixaban  5 mg Per Tube BID   atorvastatin  20 mg Oral QHS   chlorhexidine  15 mL Mouth Rinse BID   Chlorhexidine Gluconate Cloth  6 each Topical Q0600   Chlorhexidine Gluconate Cloth  6 each Topical Q0600   clonazePAM  0.5 mg Per Tube BID   darbepoetin (ARANESP) injection - DIALYSIS  60 mcg Intravenous Q Thu-HD   diclofenac Sodium  4 g Topical QID    fiber  1 packet Per Tube BID   fluconazole  200 mg Per Tube Daily   free water  200 mL Per Tube Q6H   gabapentin  100 mg Per Tube QHS   hydrocerin   Topical BID   insulin aspart  0-15 Units Subcutaneous Q4H   levETIRAcetam  500 mg Per Tube BID   loperamide HCl  1 mg Per Tube Daily   mouth rinse  15 mL Mouth Rinse q12n4p   midodrine  10 mg Per Tube Q T,Th,Sa-HD   nystatin  5 mL Oral QID   pantoprazole sodium  40 mg Per Tube Daily    have reviewed scheduled and prn medications.  Physical Exam: General: alert, NAD-  looks chronically ill-   Heart: RRR Lungs: CBS bilat Abdomen: obese-  feeding tube Extremities: min perpipheral edema Dialysis Access: left TDC -  non tender    03/05/2021,9:44 AM  LOS: 94 days

## 2021-03-05 NOTE — Progress Notes (Signed)
PT Cancellation Note  Patient Details Name: Thomas [name] MRN: [id number] DOB: 1956/04/05   Cancelled Treatment:    Reason Eval/Treat Not Completed: Fatigue/lethargy limiting ability to participate. Pt sleeping soundly upon PT arrival, requires 4 verbal attempts to awaken. Pt reports fatigue today from sitting up in recliner and working with OT, requests PT return tomorrow. PT will attempt to follow up as time allows.   Zenaida Niece 03/05/2021, 4:56 PM

## 2021-03-05 NOTE — Progress Notes (Signed)
OT Cancellation Note  Patient Details Name: Thomas [name] MRN: [id number] DOB: 22-Jan-1956   Cancelled Treatment:    Reason Eval/Treat Not Completed: Patient declined, no reason specified (patient stated he was tired and asked therapist to try later) Lodema Hong, [place]  Pager [phone] Office [place] 03/05/2021, 8:05 AM

## 2021-03-05 NOTE — Progress Notes (Signed)
[facility] PROGRESS NOTE  Thomas [name] BPZ:[id number] DOB: 04/05/1956 DOA: 11/30/2020 PCP: [name], NP   03/03/2021  Status: Remains inpatient appropriate because: Persistent severe electrolyte disturbances, Unsafe d/c plan, and Inpatient level of care appropriate due to severity of illness  Barriers to discharge:  Currently has tracheostomy in place and is requiring dialysis but remains too weak to tolerate dialysis in chair which is a requirement for outpatient HD  Level of care:  Progressive  Code Status: Full Family communication: Wife on 10/26 at the bedside DVT prophylaxis: Eliquis COVID vaccination status: Moderna 07/08/2019 and 08/11/2019 with first booster dose given 08/05/2020   HPI:  65 y.o. male with a history of T2DM with neuropathy, HTN, HLD., colon cancer s/p resection and colostomy May 2022 s/p 3 cycles chemotherapy (last was July 2022). Followed by Dr. Benay Spice. Started CAPOX June 2022, reduced cepecitabine dose starting 11/19/2020 due to hand/foot syndrome, reduced oxaliplatin due to renal impairment and poor performance status. Presented to the ED 7/24 with abdominal pain found to be febrile, tachycardic, tachypneic. Lactic acid was 6.6 and persisted at 6.8 after sepsis IV fluid bolus and cefepime. He had ARF, demand ischemia, bandemia, was hyperglycemic with negative ketones, with lactic acidosis. He developed AFib with RVR in the ED, was started on diltiazem, and was admitted to the ICU for septic shock with unclear source. Sepsis was subsequently felt to be ruled out at time of admission, presentation felt more likely to be related to cepacitabine toxicity with oral mucositis, hand/foot syndrome, and diarrhea/high output colostomy.   He was transferred to hospitalist service 7/27 but developed metabolic encephalopathy with hyoxia due to aspiration pneumonia on 7/30 requiring intubation and pressor support. Failed extubation with inability to clear secretions,  requiring reintubation 8/8, and tracheostomy subsequently placed 8/11. This was pulled out 8/22, replaced under bronch guidance, again removed 8/25 requiring reintubation after failed reinsertion. ENT revised tracheostomy in the OR 8/29. He has required IHD for ARF, and also had complications of RLE DVT now on eliquis, colitis thought to be ischemic 8/2 not requiring specific intervention, Pseudomonas pneumonia having completed treatment, agitated encephalopathy which has improved, anemia requiring 4u PRBCs total, and thrombocytopenia which has resolved. On 9/4 he was transferred to hospitalist service having tolerated trach collar.  Patient was also evaluated by Dr. Benay Spice during this admission clarified that patient was receiving curative chemotherapy prior to admission  Subsequently patient has had issues with bronchial plugging requiring urgent bronchoscopy and transient readmission to the ICU.  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  He has had episodes of aspiration pneumonitis.  He has had recurrent urinary retention despite several attempts to remove Foley therefore Foley remains in place.  He has had 2 episodes of funguria.  Gladly, as of 10/26 patient has made significant improvements in status and he is able to talk and swallowing is greatly improved as well as tongue mobility.  Trach team considering capping trials soon and nephro is watching patient off of dialysis.   Subjective:  Alert and sitting up in chair.  Expresses gratefulness over the care he has received during the hospitalization.  Wife at bedside.  Aware that capping trials likely to begin.  Shared music with the patient that I thought he might like based on the lyrics.  1 was placed for the patient and his wife and they both enjoyed the song.  Objective: Vitals:   03/05/21 2423 03/05/21 0351  BP:  (!) 121/59  Pulse: 78 89  Resp: 16 19  Temp:  98.1  F (36.7 C)  SpO2: 100% 99%    Intake/Output Summary (Last 24 hours) at 03/05/2021 0749 Last data filed at 03/04/2021 1800 Gross per 24 hour  Intake 1393.73 ml  Output 650 ml  Net 743.73 ml     Filed Weights   03/02/21 0431 03/03/21 0904 03/03/21 1320  Weight: 105.9 kg 105.8 kg 104.4 kg    Exam: Constitutional: Alert, no acute distress Respiratory: #6 XLT cuffless trach, RA to TC, PMV in place.  Lung sounds CTA, O2 99%, no increased work of breathing at rest Cardiac: S1-S2, regular pulse without tachycardia, no peripheral edema, skin warm and dry, normotensive Abdomen: Abdomen soft, nontender, PEG site unremarkable but tube is currently clogged, normoactive bowel sounds.  Colostomy collection bag with light brown liquid stool  Genitourinary: Foley in place with yellow urine noted Neurologic: CN 2-12 grossly intact.  Strength of both upper and lower extremities estimated 3/5.   Psychiatric: Awakens, oriented x3 at present.  Pleasant affect.   Assessment/Plan: Acute problems: Sepsis with hypotension -Suspect aspiration pneumonia sewed of postdialysis emesis -Sepsis physiology has resolved -Recurrent funguria-Diflucan until 10/30  Volume depletion with hypotension -Resolving but watch for reemergence of increased stool output -Due to excessive high volume liquid stools patient developed hypotension that responded to fluid challenges -Continue scheduled Imodium daily -noting volume over the past 24 hours 550 cc -Continue Nutrisource fiber per tube   Acute hypoxic respiratory failure s/p tracheostomy due to aspiration pneumonia /Recurrent pansensitive Pseudomonas tracheobronchitis with mucous plugging on 10/7 -Status post bronchoscopy  -Tolerating PMV with SLP following-trach team considering capping trials later this week -Recent episode of aspiration pneumonitis -has completed 6 days of Zosyn  Symptomatic anemia/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  Wife and patient wish to continue aggressive treatment  Grade LA C reflux esophagitis/nonbleeding duodenal ulcers -Continue PPI for total of 4-weeks (initiated 9/29) -Follow-up EGD recommended after discharge  Inability to elevate tongue with associated oral dysphagia  -Neurology has documented issues not related to recent stroke or recurrent intubation -SLP also noted during MBS that with attempts at swallowing patient was unable to move muscles and neck and there was no visible movement of hyoid bone on fluoroscopy -10/24 SLP/MBS recommended dysphagia 1 diet with thin liquids. -Tongue elevation and associated speech has improved remarkably over the past week so hopefully swallowing and aspiration risk has subsequently improved  Urinary retention -Did not tolerate discontinuation of Foley catheter so reinserted on 10/10 (Foley catheter has been discontinued a total of 3 times this admission with recurrent urinary retention) -Will need eventual follow-up with urology  Dysphagia/protein calorie malnutrition -PEG placed 10/4 -Continue tube feedings-increase free water to 200  cc every 6 hours given continued GI losses from colostomy -MBS  completed on 10/24  MRI with bilateral punctate lesion (Frontal watershed) -Stroke team differential included thrombotic etiology from either A. 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 -CBGs well controlled with SSI alone.  Continue to hold Levemir for now -Gabapentin resumed   HTN:  -BP controlled off of Norvasc   HLD:  -Resume atorvastatin      Pressure Injury 02/05/21 Coccyx Lower Stage 2 -  Partial thickness loss of dermis presenting as a shallow open injury with a red, pink wound bed without slough. healing quarter sized spot on sacrum (Active)  Date First Assessed/Time First Assessed: 02/05/21 0800   Location: Coccyx  Location Orientation: Lower  Staging: Stage 2 -  Partial thickness loss of dermis presenting as a shallow open injury with a red, pink wound bed without slough.  Wound Descript...    Assessments 02/05/2021   5:10 PM 03/04/2021 12:00 PM  Dressing Type Foam - Lift dressing to assess site every shift --  Dressing Changed --  State of Healing Early/partial granulation Epithelialized  Site / Wound Assessment Pink --  % Wound base Red or Granulating 100% --  Wound Length (cm) 2 cm --  Wound Width (cm) 2 cm --  Wound Surface Area (cm^2) 4 cm^2 --  Margins Attached edges (approximated) --  Drainage Amount None --  Treatment Cleansed --     No Linked orders to display     Data Reviewed: Basic Metabolic Panel: Recent Labs  Lab 02/27/21 0901 02/28/21 0820 03/02/21 1102 03/03/21 0923  NA 134* 133* 134* 132*  K 3.6 3.4* 3.6 3.1*  CL 96* 98 97* 97*  CO2 27 26 26 26   GLUCOSE 182* 101* 160* 143*  BUN 25* 33* 26* 31*  CREATININE 2.93* 3.39* 3.37* 3.44*  CALCIUM 8.7* 8.3* 8.9 8.8*  PHOS 4.1 4.1  --  3.7   Liver Function Tests: Recent Labs  Lab 02/27/21 0901 02/28/21 0820 03/03/21 0923  ALBUMIN 2.5* 2.1* 2.1*    CBC: Recent Labs  Lab 02/27/21 0901 02/28/21 0730 03/02/21 1102 03/03/21 0924  WBC 14.6* 12.6* 13.2* 13.3*  NEUTROABS 10.9*  --   --   --   HGB 9.2* 8.1* 9.6* 8.7*  HCT 27.2* 24.4* 29.2* 26.1*  MCV 82.2 83.3 83.2 82.9  PLT 319 285 316 301    CBG: Recent Labs  Lab 03/04/21 1117 03/04/21 1703 03/04/21 1934 03/04/21 2346 03/05/21 0351  GLUCAP 130* 162* 146* 129*  141*    Scheduled Meds:  apixaban  5 mg Per Tube BID   atorvastatin  20 mg Oral QHS   chlorhexidine  15 mL Mouth Rinse BID   Chlorhexidine Gluconate Cloth  6 each Topical Q0600   Chlorhexidine Gluconate Cloth  6 each Topical Q0600   clonazePAM  0.5 mg Per Tube BID   darbepoetin (ARANESP) injection - DIALYSIS  60 mcg Intravenous Q Thu-HD   diclofenac Sodium  4 g Topical QID   fiber  1 packet Per Tube BID   fluconazole  200 mg Per Tube Daily   free water  200 mL Per Tube Q6H   gabapentin  100 mg Per Tube QHS   hydrocerin   Topical BID   insulin aspart  0-15 Units Subcutaneous Q4H   levETIRAcetam   500 mg Per Tube BID   loperamide HCl  1 mg Per Tube Daily   mouth rinse  15 mL Mouth Rinse q12n4p   midodrine  10 mg Per Tube Q T,Th,Sa-HD   nystatin  5 mL Oral QID   pantoprazole sodium  40 mg Per Tube Daily   Continuous Infusions:  sodium chloride 250 mL (02/25/21 1021)   anticoagulant sodium citrate     feeding supplement (NEPRO CARB STEADY) 1,000 mL (03/05/21 1941)   ferric gluconate (FERRLECIT) IVPB Stopped (03/04/21 1220)   promethazine (PHENERGAN) injection (IM or IVPB) 12.5 mg (02/24/21 1950)    Principal Problem:   Diabetes mellitus with hyperosmolarity without hyperglycemic hyperosmolar nonketotic coma (HCC) Active Problems:   Lactic acidosis   Acute renal failure superimposed on stage 2 chronic kidney disease (HCC)   Metabolic acidosis   Tachycardia   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 (HCC)   Adverse effect of chemotherapy   Pressure injury of skin   Atrial fibrillation with RVR (HCC)   Sepsis (HCC)   Malnutrition of moderate degree   Acute metabolic encephalopathy   Acute respiratory failure with hypoxemia (HCC)   Acute renal failure with acute cortical necrosis (HCC)   Shock ([place])   Status post tracheostomy ([place])   Endotracheal tube present   Hemodialysis status ([place])   Palliative care by specialist   SOB (shortness of breath)   Non-intractable vomiting   Abdominal pain, epigastric   Cerebral embolism with cerebral infarction   Acute esophagitis   Duodenal ulcer disease   Consultants: PCCM General surgery Nephrology Hematology/oncology Palliative care medicine team  Procedures: 2u PRBC 7/31, 1u PRBC 8/17, 1u PRBC 8/27 Tracheostomy revision 01/05/2021 Dr. Constance Holster [facility] 8/22 10/7 briefly changed to cuffed trach to allow for short-term mechanical ventilation and subsequently changed back to cuffless trach on 10/8    Antibiotics: Vancomycin 7/24 Cefepime 7/24 - 7/27 Unasyn 7/30 >> cefepime/flagyl 7/31 - 8/6  Cefazolin x1  dose on 10/ Zosyn 10/5 through 10/9 Vancomycin 10/5 through 10/6 10/19 Zosyn >>    Time spent: 40 minutes    [name] ANP  [facility] 7 am - 330 pm/M-F for direct patient care and secure chat Please refer to Amion for contact info 94  days

## 2021-03-06 DIAGNOSIS — E1100 Type 2 diabetes mellitus with hyperosmolarity without nonketotic hyperglycemic-hyperosmolar coma (NKHHC): Secondary | ICD-10-CM | POA: Diagnosis not present

## 2021-03-06 LAB — RENAL FUNCTION PANEL
Albumin: 2.2 g/dL — ABNORMAL LOW (ref 3.5–5.0)
Anion gap: 8 (ref 5–15)
BUN: 26 mg/dL — ABNORMAL HIGH (ref 8–23)
CO2: 27 mmol/L (ref 22–32)
Calcium: 9.1 mg/dL (ref 8.9–10.3)
Chloride: 99 mmol/L (ref 98–111)
Creatinine, Ser: 2.95 mg/dL — ABNORMAL HIGH (ref 0.61–1.24)
GFR, Estimated: 23 mL/min — ABNORMAL LOW (ref >60)
Glucose, Bld: 124 mg/dL — ABNORMAL HIGH (ref 70–99)
Phosphorus: 2.7 mg/dL (ref 2.5–4.6)
Potassium: 3.4 mmol/L — ABNORMAL LOW (ref 3.5–5.1)
Sodium: 134 mmol/L — ABNORMAL LOW (ref 135–145)

## 2021-03-06 LAB — GLUCOSE, CAPILLARY
Glucose-Capillary: 111 mg/dL — ABNORMAL HIGH (ref 70–99)
Glucose-Capillary: 121 mg/dL — ABNORMAL HIGH (ref 70–99)
Glucose-Capillary: 125 mg/dL — ABNORMAL HIGH (ref 70–99)
Glucose-Capillary: 134 mg/dL — ABNORMAL HIGH (ref 70–99)
Glucose-Capillary: 134 mg/dL — ABNORMAL HIGH (ref 70–99)
Glucose-Capillary: 134 mg/dL — ABNORMAL HIGH (ref 70–99)
Glucose-Capillary: 143 mg/dL — ABNORMAL HIGH (ref 70–99)
Glucose-Capillary: 98 mg/dL (ref 70–99)

## 2021-03-06 MED ORDER — DARBEPOETIN ALFA 60 MCG/0.3ML IJ SOSY
60.0000 ug | PREFILLED_SYRINGE | INTRAMUSCULAR | Status: DC
  Administered 2021-03-06: 60 ug via SUBCUTANEOUS
  Filled 2021-03-06: qty 0.3

## 2021-03-06 MED ORDER — FREE WATER
200.0000 mL | Freq: Three times a day (TID) | Status: DC
  Administered 2021-03-06 – 2021-03-08 (×6): 200 mL

## 2021-03-06 NOTE — Progress Notes (Signed)
RT placed CAP on patient per MD order. Patient is tolerating well. Vital signs stable at this time. RT will continue to monitor.

## 2021-03-06 NOTE — Plan of Care (Signed)
  Problem: Activity: Goal: Risk for activity intolerance will decrease Outcome: Progressing   Problem: Nutrition: Goal: Adequate nutrition will be maintained Outcome: Progressing   Problem: Coping: Goal: Level of anxiety will decrease Outcome: Progressing   Problem: Elimination: Goal: Will not experience complications related to bowel motility Outcome: Progressing   Problem: Pain Managment: Goal: General experience of comfort will improve Outcome: Progressing   

## 2021-03-06 NOTE — Progress Notes (Signed)
Physical Therapy Treatment Patient Details Name: Thomas [name] MRN: [id number] DOB: [birth year] Today's Date: 03/06/2021   History of Present Illness 65 y.o. male admitted 7/24 with high ostomy output. Pt with severe sepsis and multisystem organ failure. Hospital admission complicated by volume overload, aspiration pneumonia, acute respiratory failure, AKI and ischemic colitis. Intubated 7/30. S/p trach placement 8/11. [facility] 8/12. HD initiated 8/12. To IR 8/15 for tunneled HD cath. Pulled out trach x2 8/22 replaced by PCCM. Pulled out trach again 8/25 but unable to be reinserted with pt intubated. 8/29 to OR for stoma clean up and trach replacement.Trach downsized 9/10. 10/5 respiratory distress with mucous plug requiring bronch, trach change and ventilator 10/5-10/6.  10/6 transitioned to trach collar, 10/7 pulled trach and inserted by RT with no complications. MD held HD 10/26 given increased urine output. 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    PT Comments    Pt tolerates treatment well, transferring multiple times with reduced physical assistance. Pt progresses to multiple bouts of pre-gait marching at edge of recliner. Pt continues to demonstrate steady improvement in strength, balance, and endurance. Pt will benefit from multiple bouts of sitting upright in recliner daily to improve activity tolerance. PT continues to recommend SNF placement at this time.   Recommendations for follow up therapy are one component of a multi-disciplinary discharge planning process, led by the attending physician.  Recommendations may be updated based on patient status, additional functional criteria and insurance authorization.  Follow Up Recommendations  Skilled nursing-short term rehab (<3  hours/day)     Assistance Recommended at Discharge Intermittent Supervision/Assistance  Equipment Recommendations  Wheelchair (measurements PT);Wheelchair cushion (measurements PT);Hospital bed    Recommendations for Other Services       Precautions / Restrictions Precautions Precautions: Fall Precaution Comments: R subclavian tunneled HD cath, ostomy, peg, trach Restrictions Weight Bearing Restrictions: No     Mobility  Bed Mobility Overal bed mobility: Needs Assistance Bed Mobility: Supine to Sit     Supine to sit: Min guard;HOB elevated     General bed mobility comments: increased time, verbal cues for use of rails    Transfers Overall transfer level: Needs assistance Equipment used: Rolling walker (2 wheels);1 person hand held assist Transfers: Sit to/from [facility] Sit to Stand: Min assist;Mod assist Stand pivot transfers: Mod assist         General transfer comment: pt performs sit to stand and SPT with PT hand hold initially from bed to recliner. Pt then performs multiple sit to stands from recliner with use of RW. PT provides verbal cues for rocking and trunk flexion, as well as hand placement    Ambulation/Gait Ambulation/Gait assistance: Min assist Gait Distance (Feet): 0 Feet Assistive device: Rolling walker (2 wheels)           Stairs             Wheelchair Mobility    Modified Rankin (Stroke Patients Only)       Balance Overall balance assessment: Needs assistance Sitting-balance support: No upper extremity supported;Feet supported Sitting balance-Leahy Scale: Fair     Standing balance support: Bilateral upper extremity supported Standing balance-Leahy Scale: Poor Standing balance comment: reliant on UE support of walker  Cognition Arousal/Alertness: Awake/alert Behavior During Therapy: WFL for tasks assessed/performed Overall Cognitive Status: Impaired/Different from  baseline Area of Impairment: Memory                     Memory: Decreased short-term memory                  Exercises      General Comments General comments (skin integrity, edema, etc.): VSS, trach capped      Pertinent Vitals/Pain Pain Assessment: Faces Faces Pain Scale: Hurts even more Pain Location: back Pain Descriptors / Indicators: Aching Pain Intervention(s): Monitored during session    Home Living                          Prior Function            PT Goals (current goals can now be found in the care plan section) Acute Rehab PT Goals Patient Stated Goal: to walk again Progress towards PT goals: Progressing toward goals    Frequency    Min 2X/week      PT Plan Current plan remains appropriate    Co-evaluation              AM-PAC PT "6 Clicks" Mobility   Outcome Measure  Help needed turning from your back to your side while in a flat bed without using bedrails?: A Little Help needed moving from lying on your back to sitting on the side of a flat bed without using bedrails?: A Little Help needed moving to and from a bed to a chair (including a wheelchair)?: A Lot Help needed standing up from a chair using your arms (e.g., wheelchair or bedside chair)?: A Lot Help needed to walk in hospital room?: A Lot Help needed climbing 3-5 steps with a railing? : Total 6 Click Score: 13    End of Session   Activity Tolerance: Patient tolerated treatment well Patient left: in chair;with call bell/phone within reach Nurse Communication: Mobility status PT Visit Diagnosis: Muscle weakness (generalized) (M62.81);Other abnormalities of gait and mobility (R26.89)     Time: 1694-5038 PT Time Calculation (min) (ACUTE ONLY): 31 min  Charges:  $Gait Training: 8-22 mins $Therapeutic Activity: 8-22 mins                     Zenaida Niece, PT, DPT Acute Rehabilitation Pager: [phone] Office [place] 03/06/2021, 2:19 PM

## 2021-03-06 NOTE — Progress Notes (Signed)
[facility] PROGRESS NOTE  Thomas [name] AOZ:[id number] DOB: 1955-08-26 DOA: 11/30/2020 PCP: [name], NP   03/03/2021  Status: Remains inpatient appropriate because: Persistent severe electrolyte disturbances, Unsafe d/c plan, and Inpatient level of care appropriate due to severity of illness  Barriers to discharge:  Currently has tracheostomy in place and is requiring dialysis but remains too weak to tolerate dialysis in chair which is a requirement for outpatient HD  Level of care:  Progressive  Code Status: Full Family communication: Wife on 10/28 DVT prophylaxis: Eliquis COVID vaccination status: Moderna 07/08/2019 and 08/11/2019 with first booster dose given 08/05/2020   HPI:  65 y.o. male with a history of T2DM with neuropathy, HTN, HLD., colon cancer s/p resection and colostomy May 2022 s/p 3 cycles chemotherapy (last was July 2022). Followed by Dr. Benay Spice. Started CAPOX June 2022, reduced cepecitabine dose starting 11/19/2020 due to hand/foot syndrome, reduced oxaliplatin due to renal impairment and poor performance status. Presented to the ED 7/24 with abdominal pain found to be febrile, tachycardic, tachypneic. Lactic acid was 6.6 and persisted at 6.8 after sepsis IV fluid bolus and cefepime. He had ARF, demand ischemia, bandemia, was hyperglycemic with negative ketones, with lactic acidosis. He developed AFib with RVR in the ED, was started on diltiazem, and was admitted to the ICU for septic shock with unclear source. Sepsis was subsequently felt to be ruled out at time of admission, presentation felt more likely to be related to cepacitabine toxicity with oral mucositis, hand/foot syndrome, and diarrhea/high output colostomy.   He was transferred to hospitalist service 7/27 but developed metabolic encephalopathy with hyoxia due to aspiration pneumonia on 7/30 requiring intubation and pressor support. Failed extubation with inability to clear secretions, requiring  reintubation 8/8, and tracheostomy subsequently placed 8/11. This was pulled out 8/22, replaced under bronch guidance, again removed 8/25 requiring reintubation after failed reinsertion. ENT revised tracheostomy in the OR 8/29. He has required IHD for ARF, and also had complications of RLE DVT now on eliquis, colitis thought to be ischemic 8/2 not requiring specific intervention, Pseudomonas pneumonia having completed treatment, agitated encephalopathy which has improved, anemia requiring 4u PRBCs total, and thrombocytopenia which has resolved. On 9/4 he was transferred to hospitalist service having tolerated trach collar.  Patient was also evaluated by Dr. Benay Spice during this admission clarified that patient was receiving curative chemotherapy prior to admission  Subsequently patient has had issues with bronchial plugging requiring urgent bronchoscopy and transient readmission to the ICU.  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  He has had episodes of aspiration pneumonitis.  He has had recurrent urinary retention despite several attempts to remove Foley therefore Foley remains in place.  He has had 2 episodes of funguria.  As of 10/26 patient has made significant improvements in status and he is able to talk and swallowing is greatly improved as well as tongue mobility.  Trach team initiated capping trials as of 10/28.   Subjective:  Alert, lying somewhat crooked in bed without any difficulty breathing.  Very excited capping trials have started.  So excited that dialysis continues to remain on hold.  Objective: Vitals:   03/05/21 2014 03/06/21 0017  BP: (!) 143/77 123/78  Pulse: 92 89  Resp: 15 16  Temp: 98.8 F (37.1 C) 97.7 F (36.5 C)  SpO2: 100% 99%    Intake/Output Summary (Last 24 hours) at 03/06/2021 0740 Last data filed at 03/06/2021 0600 Gross per  24 hour  Intake 1789 ml  Output --  Net 1789 ml      Filed Weights   03/02/21 0431 03/03/21 0904 03/03/21 1320  Weight: 105.9 kg 105.8 kg 104.4 kg    Exam: Constitutional: Alert, no acute distress Respiratory: #6 XLT cuffless trach, RA to TC, cap in place.  Lung sounds CTA, O2 99%, no increased work of breathing  Cardiac: S1-S2, regular pulse without tachycardia, no peripheral edema, skin warm and dry, normotensive Abdomen: Abdomen soft, nontender, PEG site unremarkable but tube is currently clogged, normoactive bowel sounds.  Colostomy with light brown liquid stool  Genitourinary: Foley in place with yellow urine noted Neurologic: CN 2-12 grossly intact.  Strength of both upper and lower extremities estimated 3/5.   Psychiatric: Awakens, oriented x3 at present.  Pleasant affect.   Assessment/Plan: Acute problems: Sepsis with hypotension -Suspect aspiration pneumonia sewed of postdialysis emesis -Sepsis physiology has resolved -Recurrent funguria-Diflucan until 10/30  Volume depletion with hypotension -Resolving but watch for reemergence of increased stool output -Due to excessive high volume liquid stools patient developed hypotension that responded to fluid challenges -Continue scheduled Imodium daily -noting volume over the past 24 hours 550 cc -Continue Nutrisource fiber per tube   Acute hypoxic respiratory failure s/p tracheostomy due to aspiration pneumonia /Recurrent pansensitive Pseudomonas tracheobronchitis with mucous plugging on 10/7 -Status post bronchoscopy  -Tolerating PMV with SLP following-trach team considering capping trials later this week -Recent episode of aspiration pneumonitis -has completed 6 days of Zosyn  Symptomatic anemia/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  Wife and patient wish to continue aggressive treatment  Grade LA C reflux esophagitis/nonbleeding duodenal ulcers -Continue PPI for total of 4-weeks (initiated 9/29) -Follow-up EGD recommended after discharge  Inability to elevate tongue with associated oral dysphagia -10/24 SLP/MBS recommended dysphagia 1 diet with thin liquids. -Tongue elevation and associated speech has improved remarkably over the past week so hopefully swallowing and aspiration risk has subsequently improved  Urinary retention -Did not tolerate discontinuation of Foley catheter so reinserted on 10/10 (Foley catheter has been discontinued a total of 3 times this admission with recurrent urinary retention) -Will need eventual follow-up with urology  Dysphagia/protein calorie malnutrition -PEG placed 10/4 -Continue tube feedings-increase free water to 200 cc every 6 hours given continued GI losses from colostomy -MBS completed on 10/24 with recommendation to continue D1 diet and thin liquid  MRI with bilateral punctate lesion (Frontal watershed) -Stroke team differential included thrombotic etiology from either A. 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  He was able to demonstrate wheelchair push-up unable to clear the chair.   Extensive RLE DVT -Continue Eliquis   New onset atrial fibrillation with RVR:  -Currently maintaining sinus rhythm w/o meds -Continue Eliquis   Seizure disorder:  - Continue keppra    Uncontrolled T2DM on OHAs prior to admission with associated neuropathy:  -HbA1c 8.7% at admission. -CBGs well controlled with SSI alone.  Continue to hold Levemir for now -Gabapentin resumed   HTN:  -BP controlled off of Norvasc   HLD:  -Resume atorvastatin      Pressure Injury 02/05/21 Coccyx Lower Stage 2 -  Partial thickness loss of dermis presenting as a shallow open injury with a red, pink wound bed without slough. healing quarter sized spot on sacrum (Active)  Date First Assessed/Time First Assessed: 02/05/21 0800   Location: Coccyx  Location Orientation: Lower  Staging: Stage 2 -  Partial thickness loss of dermis presenting as a shallow open injury with a red, pink wound bed without slough.  Wound Descript...    Assessments 02/05/2021  5:10 PM 03/05/2021  8:30 AM  Dressing Type Foam - Lift dressing to assess site  every shift Foam - Lift dressing to assess site every shift  Dressing Changed --  Dressing Change Frequency -- Every 3 days  State of Healing Early/partial granulation --  Site / Wound Assessment Pink --  % Wound base Red or Granulating 100% --  Wound Length (cm) 2 cm --  Wound Width (cm) 2 cm --  Wound Surface Area (cm^2) 4 cm^2 --  Margins Attached edges (approximated) --  Drainage Amount None --  Treatment Cleansed --     No Linked orders to display     Data Reviewed: Basic Metabolic Panel: Recent Labs  Lab 02/27/21 0901 02/28/21 0820 03/02/21 1102 03/03/21 0923 03/05/21 0543 03/06/21 0523  NA 134* 133* 134* 132* 134* 134*  K 3.6 3.4* 3.6 3.1* 3.5 3.4*  CL 96* 98 97* 97* 99 99  CO2 27 26 26 26 28 27   GLUCOSE 182* 101* 160* 143* 150* 124*  BUN 25* 33* 26* 31* 22 26*  CREATININE 2.93* 3.39* 3.37* 3.44* 2.91* 2.95*  CALCIUM 8.7* 8.3* 8.9 8.8* 8.9 9.1  PHOS 4.1 4.1  --  3.7 2.4* 2.7   Liver Function Tests: Recent Labs  Lab 02/27/21 0901 02/28/21 0820 03/03/21 0923 03/05/21 0543 03/06/21 0523  ALBUMIN 2.5* 2.1* 2.1* 2.2* 2.2*    CBC: Recent Labs  Lab 02/27/21 0901 02/28/21 0730 03/02/21 1102 03/03/21 0924  WBC 14.6* 12.6* 13.2* 13.3*  NEUTROABS 10.9*  --   --   --   HGB 9.2* 8.1* 9.6* 8.7*  HCT 27.2* 24.4* 29.2* 26.1*  MCV 82.2 83.3 83.2 82.9  PLT 319 285 316 301  CBG: Recent Labs  Lab 03/05/21 0755 03/05/21 1143 03/05/21 1544 03/06/21 0025 03/06/21 0524  GLUCAP 129* 116* 102* 134* 134*    Scheduled Meds:  apixaban  5 mg Per Tube BID   atorvastatin  20 mg Oral QHS   chlorhexidine  15 mL Mouth Rinse BID   Chlorhexidine Gluconate Cloth  6 each Topical Q0600   Chlorhexidine Gluconate Cloth  6 each Topical Q0600   clonazePAM  0.5 mg Per Tube BID   darbepoetin (ARANESP) injection - DIALYSIS  60 mcg Intravenous Q Thu-HD   diclofenac Sodium  4 g Topical QID   fiber  1 packet Per Tube BID   fluconazole  200 mg Per Tube Daily   free water   200 mL Per Tube Q6H   gabapentin  100 mg Per Tube QHS   hydrocerin   Topical BID   insulin aspart  0-15 Units Subcutaneous Q4H   levETIRAcetam  500 mg Per Tube BID   loperamide HCl  1 mg Per Tube Daily   mouth rinse  15 mL Mouth Rinse q12n4p   midodrine  10 mg Per Tube Q T,Th,Sa-HD   nystatin  5 mL Oral QID   pantoprazole sodium  40 mg Per Tube Daily   Continuous Infusions:  sodium chloride 250 mL (02/25/21 1021)   anticoagulant sodium citrate     feeding supplement (NEPRO CARB STEADY) 1,000 mL (03/05/21 5830)   ferric gluconate (FERRLECIT) IVPB Stopped (03/05/21 1051)   promethazine (PHENERGAN) injection (IM or IVPB) 12.5 mg (02/24/21 1950)    Principal Problem:   Diabetes mellitus with hyperosmolarity without hyperglycemic hyperosmolar nonketotic coma (HCC) Active Problems:   Lactic acidosis   Acute renal failure superimposed on stage 2 chronic kidney disease (HCC)   Metabolic acidosis   Tachycardia   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 (HCC)   Adverse effect of chemotherapy   Pressure injury of skin   Atrial fibrillation with RVR (HCC)   Sepsis (HCC)   Malnutrition of moderate degree   Acute metabolic encephalopathy   Acute respiratory failure with hypoxemia (HCC)   Acute renal failure with acute cortical necrosis (HCC)   Shock (HCC)   Status post tracheostomy ([place])   Endotracheal tube present   Hemodialysis status ([place])   Palliative care by specialist   SOB (shortness of breath)   Non-intractable vomiting   Abdominal pain, epigastric   Cerebral embolism with cerebral infarction   Acute esophagitis   Duodenal ulcer disease   Consultants: PCCM General surgery Nephrology Hematology/oncology Palliative care medicine team  Procedures: 2u PRBC 7/31, 1u PRBC 8/17, 1u PRBC 8/27 Tracheostomy revision 01/05/2021 Dr. Constance Holster [facility] 8/22 10/7 briefly changed to cuffed trach to allow for short-term mechanical ventilation and subsequently changed  back to cuffless trach on 10/8    Antibiotics: Vancomycin 7/24 Cefepime 7/24 - 7/27 Unasyn 7/30 >> cefepime/flagyl 7/31 - 8/6  Cefazolin x1 dose on 10/ Zosyn 10/5 through 10/9 Vancomycin 10/5 through 10/6 10/19 Zosyn >>/26 Diflucan 10/20 through 10/26 to p.o. on 10/27    Time spent: 20 minutes    [name] ANP  [facility] 7 am - 330 pm/M-F for direct patient care and secure chat Please refer to Amion for contact info 95  days

## 2021-03-06 NOTE — TOC Progression Note (Signed)
Transition of Care [facility]) - Progression Note    Patient Details  Name: Thomas [name] MRN: [id number] Date of Birth: 1955-09-10  Transition of Care [facility]) CM/SW [place], RN Phone Number: 03/06/2021, 10:00 AM  Clinical Narrative:    CM and MSW with DTP Team will continue to follow the patient for TOC needs.  The patient's trach was downsized and HD is on hold with monitoring of patient by nephrology at this time.  CM and MSW will continue to follow the patient for physical rehabilitation needs at this time once medically stable.   Expected Discharge Plan: [place] Barriers to Discharge: Continued Medical Work up  Expected Discharge Plan and Services Expected Discharge Plan: [place]   Discharge Planning Services: CM Consult Post Acute Care Choice: IP Rehab (IP Rehab versus SNF for rehab - has tracheostomy and HD care needs.) Living arrangements for the past 2 months: Single Family Home                                       Social Determinants of Health (SDOH) Interventions    Readmission Risk Interventions Readmission Risk Prevention Plan 02/16/2021  Transportation Screening Complete  Medication Review Press photographer) Complete  PCP or Specialist appointment within 3-5 days of discharge Complete  HRI or Home Care Consult Complete  SW Recovery Care/Counseling Consult Complete  Palliative Care Screening Complete  Skilled Nursing Facility Complete  Some recent data might be hidden

## 2021-03-06 NOTE — Progress Notes (Signed)
Subjective:  no UOP doc in chart -  crt 2.97  3 days after HD. No complaints.  Objective Vital signs in last 24 hours: Vitals:   03/05/21 1545 03/05/21 1601 03/05/21 2014 03/06/21 0017  BP: (!) 161/77  (!) 143/77 123/78  Pulse: 86  92 89  Resp: 17 16 15 16   Temp: 97.8 F (36.6 C)  98.8 F (37.1 C) 97.7 F (36.5 C)  TempSrc: Oral  Oral Oral  SpO2: 99%  100% 99%  Weight:      Height:       Weight change:   Intake/Output Summary (Last 24 hours) at 03/06/2021 3557 Last data filed at 03/06/2021 0600 Gross per 24 hour  Intake 1789 ml  Output --  Net 1789 ml     Assessment/ Plan: Pt is a 65 y.o. yo male with DM, HTN, colon CA and CKD who was admitted on 07/29/252 with complications of chemo then sepsis/MSOF  Assessment/Plan: 1. Sepsis/asp PNA-  overall improved-  s/p Abx course 2. ESRD -  has progressed to ESRD this admit  ?  been HD dep for 2 months but also is non oliguric and pre HD crt on 10/25 was not bad at 3.4 with no HD for 3 days-  has TDC-  had been dialyzing TTS-  dispo is difficult due to trach and physical deconditioning.  Take this opportunity to watch his trend off of dialysis, last HD 10/25 -  check daily labs-  no need for HD today. Will also do a 24h urine this weekend to eval.  3. Anemia- hgb in 8-9s recently.  On ESA  and IV iron.   4. Secondary hyperparathyroidism-  calc Maren Reamer OK- no binder-  PTH 39 5. HTN/volume-  no signif periph edema-  BP ok.  Was on midodrine with HD. On free water with low sodium-  have decreased further - will follow sodium with daily labs 6. Dispo-  difficult due to ESRD and trach -  possibly looking to downsize trach-  if he could come off of HD that opens up other possibilities for dispo  7. Trach-  on trach collar-  looking to downsize trach  8. Colon CA-  complicating factor-  not sure of plan-  albumin 2.1   Justin Mend    Labs: Basic Metabolic Panel: Recent Labs  Lab 02/28/21 0820 03/02/21 1102 03/03/21 0923  03/05/21 0543  NA 133* 134* 132* 134*  K 3.4* 3.6 3.1* 3.5  CL 98 97* 97* 99  CO2 26 26 26 28   GLUCOSE 101* 160* 143* 150*  BUN 33* 26* 31* 22  CREATININE 3.39* 3.37* 3.44* 2.91*  CALCIUM 8.3* 8.9 8.8* 8.9  PHOS 4.1  --  3.7 2.4*    Liver Function Tests: Recent Labs  Lab 02/28/21 0820 03/03/21 0923 03/05/21 0543  ALBUMIN 2.1* 2.1* 2.2*    No results for input(s): LIPASE, AMYLASE in the last 168 hours. No results for input(s): AMMONIA in the last 168 hours. CBC: Recent Labs  Lab 02/27/21 0901 02/28/21 0730 03/02/21 1102 03/03/21 0924  WBC 14.6* 12.6* 13.2* 13.3*  NEUTROABS 10.9*  --   --   --   HGB 9.2* 8.1* 9.6* 8.7*  HCT 27.2* 24.4* 29.2* 26.1*  MCV 82.2 83.3 83.2 82.9  PLT 319 285 316 301    Cardiac Enzymes: No results for input(s): CKTOTAL, CKMB, CKMBINDEX, TROPONINI in the last 168 hours. CBG: Recent Labs  Lab 03/05/21 0755 03/05/21 1143 03/05/21 1544 03/06/21 0025 03/06/21  0524  GLUCAP 129* 116* 102* 134* 134*     Iron Studies:  Recent Labs    03/03/21 0924  IRON 46  TIBC 234*  FERRITIN 507*    Studies/Results: No results found. Medications: Infusions:  sodium chloride 250 mL (02/25/21 1021)   anticoagulant sodium citrate     feeding supplement (NEPRO CARB STEADY) 1,000 mL (03/05/21 7867)   ferric gluconate (FERRLECIT) IVPB Stopped (03/05/21 1051)   promethazine (PHENERGAN) injection (IM or IVPB) 12.5 mg (02/24/21 1950)    Scheduled Medications:  apixaban  5 mg Per Tube BID   atorvastatin  20 mg Oral QHS   chlorhexidine  15 mL Mouth Rinse BID   Chlorhexidine Gluconate Cloth  6 each Topical Q0600   Chlorhexidine Gluconate Cloth  6 each Topical Q0600   clonazePAM  0.5 mg Per Tube BID   darbepoetin (ARANESP) injection - DIALYSIS  60 mcg Intravenous Q Thu-HD   diclofenac Sodium  4 g Topical QID   fiber  1 packet Per Tube BID   fluconazole  200 mg Per Tube Daily   free water  200 mL Per Tube Q6H   gabapentin  100 mg Per Tube QHS    hydrocerin   Topical BID   insulin aspart  0-15 Units Subcutaneous Q4H   levETIRAcetam  500 mg Per Tube BID   loperamide HCl  1 mg Per Tube Daily   mouth rinse  15 mL Mouth Rinse q12n4p   midodrine  10 mg Per Tube Q T,Th,Sa-HD   nystatin  5 mL Oral QID   pantoprazole sodium  40 mg Per Tube Daily    have reviewed scheduled and prn medications.  Physical Exam: General: alert, NAD-  looks chronically ill-   Heart: RRR Lungs: clear BL Abdomen: obese-  feeding tube Extremities: min perpipheral edema Dialysis Access: left TDC -  non tender    03/06/2021,6:49 AM  LOS: 95 days

## 2021-03-07 DIAGNOSIS — E1100 Type 2 diabetes mellitus with hyperosmolarity without nonketotic hyperglycemic-hyperosmolar coma (NKHHC): Secondary | ICD-10-CM | POA: Diagnosis not present

## 2021-03-07 LAB — CBC
HCT: 24.1 % — ABNORMAL LOW (ref 39.0–52.0)
Hemoglobin: 8.4 g/dL — ABNORMAL LOW (ref 13.0–17.0)
MCH: 28.2 pg (ref 26.0–34.0)
MCHC: 34.9 g/dL (ref 30.0–36.0)
MCV: 80.9 fL (ref 80.0–100.0)
Platelets: 340 10*3/uL (ref 150–400)
RBC: 2.98 MIL/uL — ABNORMAL LOW (ref 4.22–5.81)
RDW: 14.3 % (ref 11.5–15.5)
WBC: 10.6 10*3/uL — ABNORMAL HIGH (ref 4.0–10.5)
nRBC: 0 % (ref 0.0–0.2)

## 2021-03-07 LAB — RENAL FUNCTION PANEL
Albumin: 2.2 g/dL — ABNORMAL LOW (ref 3.5–5.0)
Anion gap: 8 (ref 5–15)
BUN: 29 mg/dL — ABNORMAL HIGH (ref 8–23)
CO2: 26 mmol/L (ref 22–32)
Calcium: 8.9 mg/dL (ref 8.9–10.3)
Chloride: 98 mmol/L (ref 98–111)
Creatinine, Ser: 2.9 mg/dL — ABNORMAL HIGH (ref 0.61–1.24)
GFR, Estimated: 23 mL/min — ABNORMAL LOW (ref >60)
Glucose, Bld: 124 mg/dL — ABNORMAL HIGH (ref 70–99)
Phosphorus: 3.2 mg/dL (ref 2.5–4.6)
Potassium: 3.4 mmol/L — ABNORMAL LOW (ref 3.5–5.1)
Sodium: 132 mmol/L — ABNORMAL LOW (ref 135–145)

## 2021-03-07 LAB — GLUCOSE, CAPILLARY
Glucose-Capillary: 139 mg/dL — ABNORMAL HIGH (ref 70–99)
Glucose-Capillary: 133 mg/dL — ABNORMAL HIGH (ref 70–99)
Glucose-Capillary: 138 mg/dL — ABNORMAL HIGH (ref 70–99)
Glucose-Capillary: 118 mg/dL — ABNORMAL HIGH (ref 70–99)
Glucose-Capillary: 139 mg/dL — ABNORMAL HIGH (ref 70–99)
Glucose-Capillary: 123 mg/dL — ABNORMAL HIGH (ref 70–99)

## 2021-03-07 LAB — CREATININE CLEARANCE, URINE, 24 HOUR
Collection Interval-CRCL: 24 hours
Creatinine Clearance: 20 mL/min — ABNORMAL LOW (ref 75–125)
Creatinine, 24H Ur: 815 mg/d (ref 800–2000)
Creatinine, Urine: 74.12 mg/dL
Urine Total Volume-CRCL: 1100 mL

## 2021-03-07 NOTE — Progress Notes (Addendum)
PROGRESS NOTE    Thomas [name]  ZSW:[id number] DOB: 28-Apr-1956 DOA: 11/30/2020 PCP: [name], NP    Chief Complaint  Patient presents with   Abdominal Pain    Brief Narrative:   Thomas [name] is a 65 y.o. with a pertinent PMH of CVA, colon cancer s/p colectomy/colostomy, T2DM, ESRD (on HD), Epilepsy, HTN, HLD, OSA, DVT (lovenox), who presented in August  after recent surgery for colon cancer s/p resection with signs and symptoms of sepsis and worsening respiratory distress  admitted for aspiration pneumonia. Hospital course complicated by ischemic colitis and prolonged respiratory failure requiring tracheostomy, patient with very complicated hospital stay, please see below.     7/30 AM Worsened AME, more hypoxic with worsening renal funciton, new aspiration on abx restarted 7/30 PM Pt with increased o2 requirement on 100% FIO2 on BIPAP, d/w family who wanted to proceed with intubation, pt intubated 7/31 Tracheal aspirate grew moderate candida tropicalis  8/2 down to 9 mcg of Levophed, however repeat CT abdomen/pelvis showed inflammatory wall thickening and fat stranding of the descending and sigmoid colon, new since prior, consistent with nonspecific infectious, inflammatory, or ischemic colitis.  Also new dense consolidative airspace opacity in the right lower and right middle lobes 8/3 further weaning NE. Weaning sedation. Full code, full scope per family meeting 8/2. Eraxis stopped (8/1-8/3) 8/4 off pressors, plt down to 103.  8/5 remains off pressors. Moving around more, not really following commands though  8/9 failed extubation 8/8, tolerated nasal cannula for about an hour, desaturations and inability to clear secretions-reintubated 8/8 8/10 some agitation overnight 8/11 tracheostomy placement On trach collar since 8/16 8/22 pulled trach out, replaced under bronch guidance. Has proximal area of granulation tissue that is above the bottom of the trach tube.  8/25 Removed Trach,  unable to reinsert, despite multiple attempts re-intubated 8/29 Anemia, 1 unit PRBC. ENT re-placed Trach in [place]. 9/8 tracheal aspirate growing pan-sensitive P. Aeruginosa. Started on Ceftazidine  9/19 Continues to have thick secretions.  9/30 EGD significant for duodenal ulcer, nonbleeding. 10/5 had respiratory distress and increase in O2 requirements, frequent suctioning. Ct chest showed large mucus plug in the Lft upper/lower lobes, transferred to the ICU for mechanical ventilation. 10/6 wean sedation and SBT via trach collar 10/7, patient pulled his trach was inserted by RT with no complications.    Assessment & Plan:   Acute hypoxic respiratory failure status post tracheostomy -Pulled his tracheostomy multiple times, his tracheostomy is complicated by intratracheal granulation tissue, with difficult to place trach, ENT changed trach on 8/29 to cuffless trach. -Had multiple episodes of aspiration and mucous plugging, s/p bronchoscopy with resolution of his mucous plug, completed multiple rounds of antibiotics -Stable on trach collar, tolerating PMV with SLP -PCCM following, to consider capping trials next week  AKI, ischemic ATN New ESRD -Initially started on CRRT, transition to intermittent hemodialysis on 8/12 -Nephrology following, HD on hold since 10/25, urine output is good and creatinine is relatively stable -Remains too debilitated for outpatient HD  Dysphagia/reflux esophagitis/nonbleeding duodenal ulcers -EGD 9/30 with the above findings difficult for esophagitis and nonbleeding duodenal ulcer., -Continue PPI for 4 weeks, needs follow-up EGD after discharge  MRI with bilateral punctate lesion (Frontal watershed) -SLP evaluated and noted patient is unable to elevate tongue properly bilaterally-continues to have difficulty with swallowing and phonation -Imaging consistent with acute infarcts -Stroke team following-etiology of infarcts yet to be determined-differential  includes thrombotic etiology from either A. fib or from DVT if has PFO -Neurology  signed off  Acute extensive right leg DVT:  -Continue Eliquis   New onset atrial fibrillation with RVR:  -Now in sinus rhythm, continue Eliquis  Anemia -Secondary to chronic disease and previous GI bleed -Now stable, monitor periodically  Stage IIIb colon CA s/p partial transverse colectomy with end colostomy May 2022:  - Palliative care has had multiple conversations with patient and wife and currently want to pursue aggressive care -Xeloda on hold secondary to acute illness  Recurrent urinary retention -Had recurrent retention after discontinuing Foley catheter, this was reinserted on 10/10 -He will need urology follow-up   DVT prophylaxis: Eliquis Code Status: Full Family Communication: None at bedside Disposition:   Status is: Inpatient  Remains inpatient appropriate because:Unsafe d/c plan  Dispo: The patient is from: Home              Anticipated d/c is to: SNF              Patient currently is not medically stable to d/c.   Difficult to place patient Yes    Consultants: PCCM General surgery Nephrology Hematology/oncology Palliative care medicine team   Procedures: 2u PRBC 7/31, 1u PRBC 8/17, 1u PRBC 8/27 Tracheostomy revision 01/05/2021 Dr. Constance Holster [facility] 8/22        Subjective: -No events overnight, tolerating trach collar and tube feeds,   Objective: Vitals:   03/07/21 0307 03/07/21 0500 03/07/21 0740 03/07/21 0808  BP:   (!) 142/87   Pulse:  91 90 88  Resp:  20 14 (!) 21  Temp:   98 F (36.7 C)   TempSrc:   Oral   SpO2:  97% 100% 100%  Weight: 108.4 kg     Height:        Intake/Output Summary (Last 24 hours) at 03/07/2021 1020 Last data filed at 03/07/2021 3762 Gross per 24 hour  Intake 1110.18 ml  Output 1000 ml  Net 110.18 ml   Filed Weights   03/03/21 0904 03/03/21 1320 03/07/21 0307  Weight: 105.8 kg 104.4 kg 108.4 kg    Examination:  Pleasant  middle-aged male laying in bed, awake alert, oriented to self and place, HEENT: Trach collar with secretions CVS: S1-S2, regular rhythm Lungs: Good air movement, few conducted upper airway sounds Abdomen: Soft, nontender, colostomy with liquid brown stool, rectal tube also noted, bowel sounds present Extremities: No edema  Data Reviewed: I have personally reviewed following labs and imaging studies  CBC: Recent Labs  Lab 03/02/21 1102 03/03/21 0924 03/07/21 0545  WBC 13.2* 13.3* 10.6*  HGB 9.6* 8.7* 8.4*  HCT 29.2* 26.1* 24.1*  MCV 83.2 82.9 80.9  PLT 316 301 831    Basic Metabolic Panel: Recent Labs  Lab 03/02/21 1102 03/03/21 0923 03/05/21 0543 03/06/21 0523 03/07/21 0545  NA 134* 132* 134* 134* 132*  K 3.6 3.1* 3.5 3.4* 3.4*  CL 97* 97* 99 99 98  CO2 26 26 28 27 26   GLUCOSE 160* 143* 150* 124* 124*  BUN 26* 31* 22 26* 29*  CREATININE 3.37* 3.44* 2.91* 2.95* 2.90*  CALCIUM 8.9 8.8* 8.9 9.1 8.9  PHOS  --  3.7 2.4* 2.7 3.2    GFR: Estimated Creatinine Clearance: 30.7 mL/min (A) (by C-G formula based on SCr of 2.9 mg/dL (H)).  Liver Function Tests: Recent Labs  Lab 03/03/21 0923 03/05/21 0543 03/06/21 0523 03/07/21 0545  ALBUMIN 2.1* 2.2* 2.2* 2.2*    CBG: Recent Labs  Lab 03/06/21 1532 03/06/21 2016 03/06/21 2330 03/07/21 0303 03/07/21 5176  GLUCAP 98 125* 143* 139* 133*     Recent Results (from the past 240 hour(s))  Urine Culture     Status: Abnormal   Collection Time: 02/25/21  1:01 PM   Specimen: Urine, Catheterized  Result Value Ref Range Status   Specimen Description URINE, CATHETERIZED  Final   Special Requests   Final    NONE Performed at [facility], [place] [address] [place], [place] [address]    Culture >=100,000 COLONIES/mL YEAST (A)  Final   Report Status 02/26/2021 FINAL  Final  MRSA Next Gen by PCR, Nasal     Status: None   Collection Time: 02/27/21  9:01 AM   Specimen: Nasal Mucosa; Nasal Swab  Result Value Ref Range  Status   MRSA by PCR Next Gen NOT DETECTED NOT DETECTED Final    Comment: (NOTE) The GeneXpert MRSA Assay (FDA approved for NASAL specimens only), is one component of a comprehensive MRSA colonization surveillance program. It is not intended to diagnose MRSA infection nor to guide or monitor treatment for MRSA infections. Test performance is not FDA approved in patients less than 83 years old. Performed at [facility], [place] [address], [place], [place] [address]     Scheduled Meds:  apixaban  5 mg Per Tube BID   atorvastatin  20 mg Oral QHS   chlorhexidine  15 mL Mouth Rinse BID   Chlorhexidine Gluconate Cloth  6 each Topical Q0600   Chlorhexidine Gluconate Cloth  6 each Topical Q0600   clonazePAM  0.5 mg Per Tube BID   darbepoetin (ARANESP) injection - NON-DIALYSIS  60 mcg Subcutaneous Q Fri-1800   diclofenac Sodium  4 g Topical QID   fiber  1 packet Per Tube BID   fluconazole  200 mg Per Tube Daily   free water  200 mL Per Tube Q8H   gabapentin  100 mg Per Tube QHS   hydrocerin   Topical BID   insulin aspart  0-15 Units Subcutaneous Q4H   levETIRAcetam  500 mg Per Tube BID   loperamide HCl  1 mg Per Tube Daily   mouth rinse  15 mL Mouth Rinse q12n4p   midodrine  10 mg Per Tube Q T,Th,Sa-HD   nystatin  5 mL Oral QID   pantoprazole sodium  40 mg Per Tube Daily   Continuous Infusions:  sodium chloride 250 mL (02/25/21 1021)   anticoagulant sodium citrate     feeding supplement (NEPRO CARB STEADY) 1,000 mL (03/07/21 0609)   ferric gluconate (FERRLECIT) IVPB Stopped (03/06/21 1021)   promethazine (PHENERGAN) injection (IM or IVPB) 12.5 mg (02/24/21 1950)     LOS: 96 days    Domenic Polite, MD [facility]  03/07/2021, 10:20 AM

## 2021-03-08 LAB — RENAL FUNCTION PANEL
Albumin: 2.3 g/dL — ABNORMAL LOW (ref 3.5–5.0)
Anion gap: 9 (ref 5–15)
BUN: 33 mg/dL — ABNORMAL HIGH (ref 8–23)
CO2: 26 mmol/L (ref 22–32)
Calcium: 9 mg/dL (ref 8.9–10.3)
Chloride: 98 mmol/L (ref 98–111)
Creatinine, Ser: 2.95 mg/dL — ABNORMAL HIGH (ref 0.61–1.24)
GFR, Estimated: 23 mL/min — ABNORMAL LOW (ref >60)
Glucose, Bld: 130 mg/dL — ABNORMAL HIGH (ref 70–99)
Phosphorus: 2.8 mg/dL (ref 2.5–4.6)
Potassium: 3.4 mmol/L — ABNORMAL LOW (ref 3.5–5.1)
Sodium: 133 mmol/L — ABNORMAL LOW (ref 135–145)

## 2021-03-08 LAB — GLUCOSE, CAPILLARY
Glucose-Capillary: 126 mg/dL — ABNORMAL HIGH (ref 70–99)
Glucose-Capillary: 136 mg/dL — ABNORMAL HIGH (ref 70–99)
Glucose-Capillary: 110 mg/dL — ABNORMAL HIGH (ref 70–99)
Glucose-Capillary: 125 mg/dL — ABNORMAL HIGH (ref 70–99)
Glucose-Capillary: 111 mg/dL — ABNORMAL HIGH (ref 70–99)
Glucose-Capillary: 79 mg/dL (ref 70–99)

## 2021-03-08 MED ORDER — DARBEPOETIN ALFA 100 MCG/0.5ML IJ SOSY: 100.0000 ug | PREFILLED_SYRINGE | INTRAMUSCULAR | Status: DC

## 2021-03-08 MED ORDER — POTASSIUM CHLORIDE 20 MEQ PO PACK
20.0000 meq | PACK | Freq: Two times a day (BID) | ORAL | Status: AC
  Administered 2021-03-08 (×2): 20 meq via ORAL
  Filled 2021-03-08 (×2): qty 1

## 2021-03-08 NOTE — Progress Notes (Signed)
Patient seen and examined, no changes from my note yesterday, -Clinically stable on trach collar, nephrology following, HD on hold since 10/25, creatinine stable, urine output is good  Domenic Polite, MD

## 2021-03-08 NOTE — Progress Notes (Signed)
Subjective:  UOP 1.7L.  No complaints. Overjoyed no dialysis needed now.   Objective Vital signs in last 24 hours: Vitals:   03/08/21 0500 03/08/21 0829 03/08/21 1134 03/08/21 1148  BP:   (!) 159/76 (!) 159/76  Pulse:  90 86 84  Resp:  18 18 15   Temp:   98.4 F (36.9 C)   TempSrc:   Oral   SpO2:  99% 100% 100%  Weight: 108.9 kg     Height:       Weight change: 0.5 kg  Intake/Output Summary (Last 24 hours) at 03/08/2021 1305 Last data filed at 03/08/2021 0413 Gross per 24 hour  Intake --  Output 1300 ml  Net -1300 ml     Assessment/ Plan: Pt is a 65 y.o. yo male with DM, HTN, colon CA and CKD who was admitted on 9/41/7408 with complications of chemo then sepsis/MSOF  Assessment/Plan: 1. Sepsis/asp PNA-  overall improved-  s/p Abx course 2. ESRD -  was thought to have progressed to ESRD this admit after 2 mo of dialysis dependency but now UOP is good and Cr has stabilized out around 3 off HD -- last HD was 10/25.  24h urine Cr Cl was 20.  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  Will have HD catheter removed.  3. Anemia- hgb in 8-9s recently.  On ESA  and IV iron.  Increase ESA dose.  4. Secondary hyperparathyroidism-  calc Maren Reamer OK- no binder-  PTH 39 5. HTN/volume-  no signif periph edema-  BP ok.  Serum sodium 133 - d/c Free water flushes entirely, use only what's needed to maintain patency if any.  6. Dispo-  difficult due to ESRD and trach -  possibly looking to downsize trach-  now that he's off HD disposition should be easier 7. Trach-  on trach collar-  looking to downsize trach  8. Colon CA-  complicating factor-  not sure of plan-  albumin 2.1  9.  Hypokalemia: replete.   Will continue to follow for now  Thomas [name]    Labs: Basic Metabolic Panel: Recent Labs  Lab 03/06/21 0523 03/07/21 0545 03/08/21 0500  NA 134* 132* 133*  K 3.4* 3.4* 3.4*  CL 99 98 98  CO2 27 26 26   GLUCOSE 124* 124* 130*  BUN 26* 29* 33*  CREATININE  2.95* 2.90* 2.95*  CALCIUM 9.1 8.9 9.0  PHOS 2.7 3.2 2.8    Liver Function Tests: Recent Labs  Lab 03/06/21 0523 03/07/21 0545 03/08/21 0500  ALBUMIN 2.2* 2.2* 2.3*    No results for input(s): LIPASE, AMYLASE in the last 168 hours. No results for input(s): AMMONIA in the last 168 hours. CBC: Recent Labs  Lab 03/02/21 1102 03/03/21 0924 03/07/21 0545  WBC 13.2* 13.3* 10.6*  HGB 9.6* 8.7* 8.4*  HCT 29.2* 26.1* 24.1*  MCV 83.2 82.9 80.9  PLT 316 301 340    Cardiac Enzymes: No results for input(s): CKTOTAL, CKMB, CKMBINDEX, TROPONINI in the last 168 hours. CBG: Recent Labs  Lab 03/07/21 1938 03/07/21 2258 03/08/21 0412 03/08/21 0836 03/08/21 1213  GLUCAP 139* 123* 126* 136* 110*     Iron Studies:  No results for input(s): IRON, TIBC, TRANSFERRIN, FERRITIN in the last 72 hours.  Studies/Results: No results found. Medications: Infusions:  sodium chloride 250 mL (02/25/21 1021)   anticoagulant sodium citrate Stopped (03/07/21 1412)   feeding supplement (NEPRO CARB STEADY) 1,000 mL (03/07/21 2218)   promethazine (  PHENERGAN) injection (IM or IVPB) 12.5 mg (02/24/21 1950)    Scheduled Medications:  apixaban  5 mg Per Tube BID   atorvastatin  20 mg Oral QHS   chlorhexidine  15 mL Mouth Rinse BID   Chlorhexidine Gluconate Cloth  6 each Topical Q0600   Chlorhexidine Gluconate Cloth  6 each Topical Q0600   clonazePAM  0.5 mg Per Tube BID   darbepoetin (ARANESP) injection - NON-DIALYSIS  60 mcg Subcutaneous Q Fri-1800   diclofenac Sodium  4 g Topical QID   fiber  1 packet Per Tube BID   free water  200 mL Per Tube Q8H   gabapentin  100 mg Per Tube QHS   hydrocerin   Topical BID   insulin aspart  0-15 Units Subcutaneous Q4H   levETIRAcetam  500 mg Per Tube BID   loperamide HCl  1 mg Per Tube Daily   mouth rinse  15 mL Mouth Rinse q12n4p   midodrine  10 mg Per Tube Q T,Th,Sa-HD   pantoprazole sodium  40 mg Per Tube Daily    have reviewed scheduled and prn  medications.  Physical Exam: General: alert, NAD-  looks chronically ill-   Heart: RRR Lungs: clear BL Abdomen: obese-  feeding tube Extremities: min perpipheral edema Dialysis Access: left TDC -  non tender    03/08/2021,1:05 PM  LOS: 97 days

## 2021-03-08 NOTE — Progress Notes (Signed)
Pt remained stable throughout the shift. All meds were administered and pt did not have any need for PRN meds. Pt did eat small amounts of food brought from home.

## 2021-03-08 NOTE — Plan of Care (Signed)

## 2021-03-09 ENCOUNTER — Inpatient Hospital Stay (HOSPITAL_COMMUNITY): Payer: Medicare HMO

## 2021-03-09 DIAGNOSIS — Z930 Tracheostomy status: Secondary | ICD-10-CM | POA: Diagnosis not present

## 2021-03-09 DIAGNOSIS — E1100 Type 2 diabetes mellitus with hyperosmolarity without nonketotic hyperglycemic-hyperosmolar coma (NKHHC): Secondary | ICD-10-CM | POA: Diagnosis not present

## 2021-03-09 HISTORY — PX: IR REMOVAL TUN CV CATH W/O FL: IMG2289

## 2021-03-09 LAB — RENAL FUNCTION PANEL
Albumin: 2.4 g/dL — ABNORMAL LOW (ref 3.5–5.0)
Anion gap: 6 (ref 5–15)
BUN: 34 mg/dL — ABNORMAL HIGH (ref 8–23)
CO2: 26 mmol/L (ref 22–32)
Calcium: 9.2 mg/dL (ref 8.9–10.3)
Chloride: 102 mmol/L (ref 98–111)
Creatinine, Ser: 2.95 mg/dL — ABNORMAL HIGH (ref 0.61–1.24)
GFR, Estimated: 23 mL/min — ABNORMAL LOW
Glucose, Bld: 99 mg/dL (ref 70–99)
Phosphorus: 3.1 mg/dL (ref 2.5–4.6)
Potassium: 4.2 mmol/L (ref 3.5–5.1)
Sodium: 134 mmol/L — ABNORMAL LOW (ref 135–145)

## 2021-03-09 LAB — GLUCOSE, CAPILLARY
Glucose-Capillary: 100 mg/dL — ABNORMAL HIGH (ref 70–99)
Glucose-Capillary: 112 mg/dL — ABNORMAL HIGH (ref 70–99)
Glucose-Capillary: 115 mg/dL — ABNORMAL HIGH (ref 70–99)
Glucose-Capillary: 128 mg/dL — ABNORMAL HIGH (ref 70–99)
Glucose-Capillary: 94 mg/dL (ref 70–99)
Glucose-Capillary: 94 mg/dL (ref 70–99)
Glucose-Capillary: 95 mg/dL (ref 70–99)
Glucose-Capillary: 96 mg/dL (ref 70–99)
Glucose-Capillary: 99 mg/dL (ref 70–99)

## 2021-03-09 MED ORDER — NEPRO/CARBSTEADY PO LIQD
1000.0000 mL | ORAL | Status: DC
  Administered 2021-03-10: 1000 mL
  Filled 2021-03-09 (×3): qty 1000

## 2021-03-09 MED ORDER — LIDOCAINE HCL 1 % IJ SOLN
INTRAMUSCULAR | Status: AC
  Administered 2021-03-09: 10 mL
  Filled 2021-03-09: qty 20

## 2021-03-09 MED ORDER — CHLORHEXIDINE GLUCONATE 4 % EX LIQD
CUTANEOUS | Status: AC
  Filled 2021-03-09: qty 15

## 2021-03-09 NOTE — Progress Notes (Signed)
IR consult received for clogged G-tube. Originally placed 10/4 Recurrent clogging suspected due to meds. Unsuccessful attempts at bedside.  Pt was already in IR for HD cath removal.  G-tube successful unclogged with declogging tool. Followed by repeated flush without difficulty. Declog tool sent with pt in case needed at bedside.  If po intake status continues to improve, G-tube can be removed at the 8 week time interval, which would be early December.  Ascencion Dike PA-C Interventional Radiology 03/09/2021 11:27 AM

## 2021-03-09 NOTE — Progress Notes (Addendum)
[facility] PROGRESS NOTE  Thomas [name] ZOX:[id number] DOB: Jan 23, 1956 DOA: 11/30/2020 PCP: [name], NP   03/03/2021  Status: Inpatient: Unsafe discharge plan  Barriers to discharge:  Social: Persistent severe physical deconditioning that is improving but is not at the level that can discharge back to home environment  Clinical: Currently has tracheostomy in place; slow progression with dysphagia suspect after decannulation that patient's diet can be eventually advanced to mechanical soft.;  Continues to have significant physical deconditioning  Level of care:  Telemetry Medical  Code Status: Full Family communication: Wife on 10/28 DVT prophylaxis: Eliquis COVID vaccination status: Moderna 07/08/2019 and 08/11/2019 with first booster dose given 08/05/2020   HPI:  64 y.o. male with a history of T2DM with neuropathy, HTN, HLD., colon cancer s/p resection and colostomy May 2022 s/p 3 cycles chemotherapy (last was July 2022). Followed by Dr. Benay Spice. Started CAPOX June 2022, reduced cepecitabine dose starting 11/19/2020 due to hand/foot syndrome, reduced oxaliplatin due to renal impairment and poor performance status. Presented to the ED 7/24 with abdominal pain found to be febrile, tachycardic, tachypneic. Lactic acid was 6.6 and persisted at 6.8 after sepsis IV fluid bolus and cefepime. He had ARF, demand ischemia, bandemia, was hyperglycemic with negative ketones, with lactic acidosis. He developed AFib with RVR in the ED, was started on diltiazem, and was admitted to the ICU for septic shock with unclear source. Sepsis was subsequently felt to be ruled out at time of admission, presentation felt more likely to be related to cepacitabine toxicity with oral mucositis, hand/foot syndrome, and diarrhea/high output colostomy.   He was transferred to hospitalist service 7/27 but developed metabolic encephalopathy with hyoxia due to aspiration pneumonia on 7/30 requiring intubation  and pressor support. Failed extubation with inability to clear secretions, requiring reintubation 8/8, and tracheostomy subsequently placed 8/11. This was pulled out 8/22, replaced under bronch guidance, again removed 8/25 requiring reintubation after failed reinsertion. ENT revised tracheostomy in the OR 8/29. He has required IHD for ARF, and also had complications of RLE DVT now on eliquis, colitis thought to be ischemic 8/2 not requiring specific intervention, Pseudomonas pneumonia having completed treatment, agitated encephalopathy which has improved, anemia requiring 4u PRBCs total, and thrombocytopenia which has resolved. On 9/4 he was transferred to hospitalist service having tolerated trach collar.  Patient was also evaluated by Dr. Benay Spice during this admission clarified that patient was receiving curative chemotherapy prior to admission  Subsequently patient has had issues with bronchial plugging requiring urgent bronchoscopy and transient readmission to the ICU.  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  He has had episodes of aspiration pneumonitis.  He has had recurrent urinary retention despite several attempts to remove Foley therefore Foley remains in place.  He has had 2 episodes of funguria.  As of 10/26 patient has made significant improvements in status and he is able to talk and swallowing is greatly improved as well as tongue mobility.  Trach team initiated capping trials as of 10/28.   Subjective:  Alert and sitting in bed.  Continues to state that he wishes to discharge home.  Wife also at bedside.  Objective: Vitals:   03/09/21 0338 03/09/21 0628  BP:  (!) 150/82  Pulse: 90 94  Resp: 18 19  Temp:    SpO2:  98%    Intake/Output Summary (Last 24 hours) at 03/09/2021 0751 Last data filed at 03/09/2021 0630 Gross per 24 hour  Intake --  Output 2600 ml  Net -2600 ml     Filed Weights    03/07/21 0307 03/08/21 0500 03/09/21 0500  Weight: 108.4 kg 108.9 kg 108.3 kg    Exam: Constitutional: Alert, no acute distress Respiratory: #6 XLT cuffless trach, RA to TC, cap in place.  Lung sounds CTA, O2 99%, no increased work of breathing  Cardiac: S1-S2, regular pulse without tachycardia, no peripheral edema, skin warm and dry, normotensive Abdomen: Abdomen soft, nontender, PEG tube clogged earlier this morning but while in interventional radiology tube was unclogged.  Normoactive bowel sounds.  Colostomy with light brown liquid stool  Genitourinary: Foley in place with yellow urine noted Neurologic: CN 2-12 grossly intact.  Strength of both upper and lower extremities estimated 3/5.   Psychiatric: Awakens, oriented x3 at present.  Pleasant affect.   Assessment/Plan: Acute problems: Sepsis with hypotension -Suspect aspiration pneumonia sewed of postdialysis emesis -Sepsis physiology has resolved -Recurrent funguria-completed Diflucan on 10/30  Volume depletion with hypotension -Resolved -Due to excessive high volume liquid stools -Continue scheduled Imodium daily  -Continue Nutrisource fiber per tube   Acute hypoxic respiratory failure s/p tracheostomy due to aspiration pneumonia /Recurrent pansensitive Pseudomonas tracheobronchitis with mucous plugging on 10/7 -Status post bronchoscopy  -Capping trials initiated on 10/28 and he was successfully decannulated on 10/31  Symptomatic anemia/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 -He has received a total of 5 units PRBCs since admission with current hemoglobin stable at 8.7  Acute renal failure on stage IIIb CKD/Presumed ischemic ATN -Function has remained stable and patient has maintain adequate urinary output therefore no longer requiring hemodialysis -TDC catheter removed on 10/31 in IR  Acute delirium -Resolved  -2/2 benzodiazepine withdrawal -Continue Klonopin 0.5 mg BID  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  Wife and patient wish to continue aggressive treatment  Grade LA C reflux esophagitis/nonbleeding duodenal ulcers -Continue PPI for total of 4-weeks (initiated 9/29) -Follow-up EGD recommended after discharge  Inability to elevate tongue with associated oral dysphagia -Continues to improve with SLP following -Remains on a D1 diet  Urinary retention -Has had recurrent episodes of urinary retention requiring Foley catheter placement -10/31 we will discontinue Foley again and monitor bladder scan every 4 hours  Dysphagia/protein calorie malnutrition -PEG placed 10/4 -10/31 SLP advanced to D3 diet -We will change to nocturnal tube feedings over 12 hours -If tolerates oral diet appropriate intake continue PEG tube after tract matures.  PEG tube was placed on 10/4 therefore need to wait at least 6 weeks before removing tube  MRI with bilateral punctate lesion (Frontal watershed) -Stroke team differential included thrombotic etiology from either A. 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 -CBGs well controlled with SSI alone.  Continue to hold Levemir for now -Gabapentin resumed   HTN:  -BP controlled off of Norvasc   HLD:  -Resume atorvastatin      Pressure Injury 02/05/21 Coccyx Lower Stage 2 -  Partial thickness loss of dermis presenting as a shallow open injury with a red, pink wound bed without slough. healing quarter sized spot on sacrum (Active)  Date First Assessed/Time First Assessed: 02/05/21 0800   Location: Coccyx  Location Orientation: Lower  Staging: Stage 2 -  Partial thickness loss of dermis presenting as a shallow open injury with a red, pink wound bed without slough.  Wound Descript...    Assessments 02/05/2021  5:10 PM 03/08/2021  9:13 AM  Dressing Type Foam - Lift dressing to assess site every shift Foam - Lift dressing to assess site every shift  Dressing Changed Clean;Dry;Intact  Dressing Change Frequency -- Every 3 days  State of Healing Early/partial granulation --  Site / Wound Assessment Pink Clean;Dry  % Wound base Red or Granulating 100% --  Peri-wound Assessment -- Intact  Wound Length (cm) 2 cm --  Wound Width (cm) 2 cm --  Wound Surface Area (cm^2) 4 cm^2 --  Margins Attached edges (approximated) --  Drainage Amount None None  Treatment Cleansed --     No Linked orders to display     Data Reviewed: Basic Metabolic Panel: Recent Labs  Lab 03/05/21 0543  03/06/21 0523 03/07/21 0545 03/08/21 0500 03/09/21 0455  NA 134* 134* 132* 133* 134*  K 3.5 3.4* 3.4* 3.4* 4.2  CL 99 99 98 98 102  CO2 28 27 26 26 26   GLUCOSE 150* 124* 124* 130* 99  BUN 22 26* 29* 33* 34*  CREATININE 2.91* 2.95* 2.90* 2.95* 2.95*  CALCIUM 8.9 9.1 8.9 9.0 9.2  PHOS 2.4* 2.7 3.2 2.8 3.1   Liver Function Tests: Recent Labs  Lab 03/05/21 0543 03/06/21 0523 03/07/21 0545 03/08/21 0500 03/09/21 0455  ALBUMIN 2.2* 2.2* 2.2* 2.3* 2.4*    CBC: Recent Labs  Lab 03/02/21 1102 03/03/21 0924 03/07/21 0545  WBC 13.2* 13.3* 10.6*  HGB 9.6* 8.7* 8.4*  HCT 29.2* 26.1* 24.1*  MCV 83.2 82.9 80.9  PLT 316 301 340    CBG: Recent Labs  Lab 03/08/21 2306 03/09/21 0004 03/09/21 0243 03/09/21 0327 03/09/21 0624  GLUCAP 79 96 94 95 99    Scheduled Meds:  apixaban  5 mg Per Tube BID   atorvastatin  20 mg Oral QHS   chlorhexidine  15 mL Mouth Rinse BID   Chlorhexidine Gluconate Cloth  6 each Topical Q0600   Chlorhexidine Gluconate Cloth  6 each Topical Q0600   clonazePAM  0.5 mg Per Tube BID   [START ON 03/13/2021] darbepoetin (ARANESP) injection - NON-DIALYSIS  100 mcg Subcutaneous Q Fri-1800   diclofenac Sodium  4 g Topical QID   fiber  1 packet Per Tube BID   gabapentin  100 mg Per Tube QHS   hydrocerin   Topical BID   insulin aspart  0-15 Units Subcutaneous Q4H   levETIRAcetam  500 mg Per Tube BID   loperamide HCl  1 mg Per Tube Daily   mouth rinse  15 mL Mouth Rinse q12n4p   pantoprazole sodium  40 mg Per Tube Daily   Continuous Infusions:  sodium chloride 250 mL (02/25/21 1021)   anticoagulant sodium citrate Stopped (03/07/21 1412)   feeding supplement (NEPRO CARB STEADY) 1,000 mL (03/07/21 2218)   promethazine (PHENERGAN) injection (IM or IVPB) 12.5 mg (02/24/21 1950)    Principal Problem:   Diabetes mellitus with hyperosmolarity without hyperglycemic hyperosmolar nonketotic coma (HCC) Active Problems:   Lactic acidosis   Acute renal failure  superimposed on stage 2 chronic kidney disease (HCC)   Metabolic acidosis   Tachycardia   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 (HCC)   Adverse effect of chemotherapy   Pressure injury of skin   Atrial fibrillation with RVR ([place])   Sepsis ([place])   Malnutrition of moderate degree   Acute metabolic encephalopathy   Acute respiratory failure with hypoxemia (HCC)   Acute renal failure with acute cortical necrosis (HCC)   Shock ([place])   Status post tracheostomy ([place])   Endotracheal tube present   Hemodialysis status ([place])   Palliative care by specialist   SOB (shortness of breath)   Non-intractable vomiting   Abdominal pain, epigastric   Cerebral embolism with cerebral infarction   Acute esophagitis   Duodenal ulcer disease   Consultants: PCCM General surgery Nephrology Hematology/oncology Palliative care medicine team  Procedures: 2u PRBC 7/31, 1u PRBC 8/17, 1u PRBC 8/27 Tracheostomy revision 01/05/2021 Dr. Constance Holster [facility] 8/22 10/7 briefly changed to cuffed trach to allow for short-term mechanical ventilation and subsequently changed back to cuffless trach on 10/8    Antibiotics: Vancomycin 7/24 Cefepime 7/24 - 7/27 Unasyn 7/30 >> cefepime/flagyl 7/31 - 8/6  Cefazolin x1 dose on 10/ Zosyn 10/5 through 10/9 Vancomycin 10/5 through 10/6 10/19 Zosyn >>10/26 Diflucan 10/20 through 10/26 to p.o. on 10/27    Time spent: 20 minutes    [name] ANP  [facility] 7 am - 330 pm/M-F for direct patient care and secure chat Please refer to Amion for contact info 98  days

## 2021-03-09 NOTE — Progress Notes (Signed)
Subjective:  UOP 1.8L. Crt stable. Patient feels well without complaints.  Objective Vital signs in last 24 hours: Vitals:   03/09/21 0628 03/09/21 0816 03/09/21 0836 03/09/21 1141  BP: (!) 150/82  (!) 160/84   Pulse: 94 87 84 89  Resp: 19 17 12 11   Temp:   98.4 F (36.9 C)   TempSrc:   Oral   SpO2: 98% 100% 97% 100%  Weight:      Height:       Weight change: -0.6 kg  Intake/Output Summary (Last 24 hours) at 03/09/2021 1153 Last data filed at 03/09/2021 0630 Gross per 24 hour  Intake --  Output 2600 ml  Net -2600 ml    Assessment/ Plan: Pt is a 65 y.o. yo male with DM, HTN, colon CA and CKD who was admitted on 7/37/1062 with complications of chemo then sepsis/MSOF   Assessment/Plan: 1. Sepsis/asp PNA-  overall improved-  s/p Abx course 2. ESRD -  was thought to have progressed to ESRD this admit after 2 mo of dialysis dependency but now UOP is good and Cr has stabilized out around 3 off HD -- last HD was 10/25.  24h urine Cr Cl was 20.  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  Will have HD catheter removed and plan to sign off. Will need nephrology follow up whenever he leaves. 3. Anemia- hgb in 8-9s recently.  On aranesp 167mcg weekly (recently increase). Would need follow up of this med outpatient. Would decrease to 40mcg if hgb > 10 and stop if hgb >11.  5. HTN/volume-  no signif periph edema-  BP ok.  Serum sodium 133 - d/c Free water flushes entirely, use only what's needed to maintain patency if any.  6. Dispo-  difficult due to ESRD and trach -  possibly looking to downsize trach-  now that he's off HD disposition should be easier 7. Trach-  on trach collar-  looking to downsize trach  8. Colon CA-  complicating factor-  not sure of plan-  albumin 2.1  9.  Hypokalemia: replete.   Will continue to follow for now  Reesa Chew    Labs: Basic Metabolic Panel: Recent Labs  Lab 03/07/21 0545 03/08/21 0500 03/09/21 0455  NA 132* 133* 134*   K 3.4* 3.4* 4.2  CL 98 98 102  CO2 26 26 26   GLUCOSE 124* 130* 99  BUN 29* 33* 34*  CREATININE 2.90* 2.95* 2.95*  CALCIUM 8.9 9.0 9.2  PHOS 3.2 2.8 3.1   Liver Function Tests: Recent Labs  Lab 03/07/21 0545 03/08/21 0500 03/09/21 0455  ALBUMIN 2.2* 2.3* 2.4*   No results for input(s): LIPASE, AMYLASE in the last 168 hours. No results for input(s): AMMONIA in the last 168 hours. CBC: Recent Labs  Lab 03/03/21 0924 03/07/21 0545  WBC 13.3* 10.6*  HGB 8.7* 8.4*  HCT 26.1* 24.1*  MCV 82.9 80.9  PLT 301 340   Cardiac Enzymes: No results for input(s): CKTOTAL, CKMB, CKMBINDEX, TROPONINI in the last 168 hours. CBG: Recent Labs  Lab 03/09/21 0004 03/09/21 0243 03/09/21 0327 03/09/21 0624 03/09/21 0834  GLUCAP 96 94 95 99 100*    Iron Studies:  No results for input(s): IRON, TIBC, TRANSFERRIN, FERRITIN in the last 72 hours.  Studies/Results: No results found. Medications: Infusions:  sodium chloride 250 mL (02/25/21 1021)   anticoagulant sodium citrate Stopped (03/07/21 1412)   feeding supplement (NEPRO CARB STEADY) 1,000 mL (03/07/21 2218)  promethazine (PHENERGAN) injection (IM or IVPB) 12.5 mg (02/24/21 1950)    Scheduled Medications:  apixaban  5 mg Per Tube BID   atorvastatin  20 mg Oral QHS   chlorhexidine       chlorhexidine  15 mL Mouth Rinse BID   Chlorhexidine Gluconate Cloth  6 each Topical Q0600   Chlorhexidine Gluconate Cloth  6 each Topical Q0600   clonazePAM  0.5 mg Per Tube BID   [START ON 03/13/2021] darbepoetin (ARANESP) injection - NON-DIALYSIS  100 mcg Subcutaneous Q Fri-1800   diclofenac Sodium  4 g Topical QID   fiber  1 packet Per Tube BID   gabapentin  100 mg Per Tube QHS   hydrocerin   Topical BID   insulin aspart  0-15 Units Subcutaneous Q4H   levETIRAcetam  500 mg Per Tube BID   loperamide HCl  1 mg Per Tube Daily   mouth rinse  15 mL Mouth Rinse q12n4p   pantoprazole sodium  40 mg Per Tube Daily    have reviewed scheduled  and prn medications.  Physical Exam: General: alert, NAD-  looks chronically ill-   Heart: RRR Lungs: clear BL Abdomen: obese-  feeding tube Extremities: min perpipheral edema Dialysis Access: left TDC -  non tender    03/09/2021,11:53 AM  LOS: 98 days

## 2021-03-09 NOTE — Progress Notes (Signed)
Patient's peg tube was clogged at shift change. Writer and two other RN's attempted to unclog peg tube and was unsuccessful. Patient is also on Dys 1 diet, and is able to eat and drink without difficulties.

## 2021-03-09 NOTE — Procedures (Signed)
Successful removal of right IJ tunneled HD catheter.   After obtaining consent and performing a time-out, the left upper chest and catheter were prepped and draped in the normal sterile fashion. The heparin was removed from both ports. 1% lidocaine was used for local anesthesia. Using gentle blunt dissection and moderate manual traction the cuff of the catheter was exposed and the catheter was removed in its entirety. Pressure was held until hemostasis was obtained. A sterile dressing was applied. The patient tolerated the procedure well with no immediate complications.   Soyla Dryer, [place] [phone] 03/09/2021, 12:25 PM

## 2021-03-09 NOTE — Plan of Care (Signed)
  Problem: Clinical Measurements: Goal: Ability to maintain clinical measurements within normal limits will improve Outcome: Progressing Goal: Will remain free from infection Outcome: Progressing Goal: Respiratory complications will improve Outcome: Progressing   

## 2021-03-09 NOTE — Progress Notes (Signed)
Patient trach removed per MD order.  Gauze placed over stoma site.  Patient's sats currently 100%.  Vitals are stable.  No complications noted.

## 2021-03-09 NOTE — Progress Notes (Signed)
PCCM:  Seen and evaluated for trach care.  S: Doing well.  Been capped since 03/06/2021.  Able to communicate.  Able to eat.  O: BP (!) 160/84 (BP Location: Right Arm)   Pulse 89   Temp 98.4 F (36.9 C) (Oral)   Resp 11   Ht 5\' 8"  (1.727 m)   Wt 108.3 kg   SpO2 100%   BMI 36.30 kg/m   Gen: elderly male, resting in bed  HENT: Trach in place, capped  Heart: RRR, s1 s2  Lungs: CTAB no wheeze   A: Chronic respiratory failure s/p trach  Doing well capped  Colon cancer Deconditioned  ESRD   P: Doing well with cuffless capped  Ok for decannulation today  Continue work with PT OT and SLP post decannulation   Garner Nash, DO [facility] 03/09/2021 12:43 PM

## 2021-03-09 NOTE — Progress Notes (Signed)
Occupational Therapy Treatment Patient Details Name: Thomas [name] MRN: [id number] DOB: November 07, 1955 Today's Date: 03/09/2021   History of present illness 65 y.o. male admitted 7/24 with high ostomy output. Pt with severe sepsis and multisystem organ failure. Hospital admission complicated by volume overload, aspiration pneumonia, acute respiratory failure, AKI and ischemic colitis. Intubated 7/30. S/p trach placement 8/11. CorTrak 8/12. HD initiated 8/12. To IR 8/15 for tunneled HD cath. Pulled out trach x2 8/22 replaced by PCCM. Pulled out trach again 8/25 but unable to be reinserted with pt intubated. 8/29 to OR for stoma clean up and trach replacement.Trach downsized 9/10. 10/5 respiratory distress with mucous plug requiring bronch, trach change and ventilator 10/5-10/6.  10/6 transitioned to trach collar, 10/7 pulled trach and inserted by RT with no complications. MD held HD 10/26 given increased urine output. 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   OT comments  Patient was agreeable to OT treatment but was reluctant due to went for procedure earlier today. Patient required extra time and min assist to get to eob. Patient required rest before attempting to transfer to recliner and took 2 attempts to stand at rw and min assist to transfer.  Grooming and UE HEP performed seated in recliner. Patient is making good progress with OT treatment.    Recommendations for follow up therapy are one component of a multi-disciplinary discharge planning process, led by the attending physician.  Recommendations may be updated based on patient status, additional functional criteria and insurance authorization.    Follow Up Recommendations  Skilled nursing-short term rehab (<3 hours/day)    Assistance  Recommended at Discharge Frequent or constant Supervision/Assistance  Equipment Recommendations  Other (comment)    Recommendations for Other Services Other (comment)    Precautions / Restrictions Precautions Precautions: Fall Precaution Comments: ostomy, peg, trach Restrictions Weight Bearing Restrictions: No       Mobility Bed Mobility Overal bed mobility: Needs Assistance Bed Mobility: Supine to Sit   Sidelying to sit: Min assist;HOB elevated Supine to sit: Min guard;HOB elevated     General bed mobility comments: increased time, verbal cues for use of rails    Transfers Overall transfer level: Needs assistance Equipment used: Rolling walker (2 wheels) Transfers: Sit to/from [facility] Sit to Stand: Min assist Stand pivot transfers: Min assist;Mod assist         General transfer comment: required 2 attempts to stand before being able to transfer     Balance Overall balance assessment: Needs assistance Sitting-balance support: No upper extremity supported;Feet supported Sitting balance-Leahy Scale: Fair Sitting balance - Comments: able to sit on eob   Standing balance support: Bilateral upper extremity supported Standing balance-Leahy Scale: Poor Standing balance comment: reliant on UE support of walker                           ADL either performed or assessed with clinical judgement   ADL Overall ADL's : Needs assistance/impaired     Grooming: Set up;Sitting;Wash/dry hands;Wash/dry face Grooming Details (indicate cue type and reason): wash face and hands seated in recliner                               General ADL Comments: performed seated in recliner.  Vision       Perception     Praxis      Cognition Arousal/Alertness: Awake/alert Behavior During Therapy: WFL for tasks assessed/performed Overall Cognitive Status: Impaired/Different from baseline Area of Impairment: Memory                  Orientation Level: Disoriented to;Time Current Attention Level: Sustained Memory: Decreased short-term memory Following Commands: Follows one step commands with increased time Safety/Judgement: Decreased awareness of deficits Awareness: Intellectual Problem Solving: Slow processing General Comments: patient happy able no longer needing HD          Exercises Exercises: General Upper Extremity General Exercises - Upper Extremity Shoulder Flexion: Strengthening;Both;10 reps;Theraband Theraband Level (Shoulder Flexion): Level 1 (Yellow) Shoulder ABduction: Strengthening;Both;10 reps;Supine;Theraband Theraband Level (Shoulder Abduction): Level 1 (Yellow) Elbow Flexion: Strengthening;Both;15 reps;Seated;Theraband Theraband Level (Elbow Flexion): Level 1 (Yellow) Elbow Extension: Strengthening;Both;15 reps;Seated;Theraband Theraband Level (Elbow Extension): Level 1 (Yellow)   Shoulder Instructions       General Comments      Pertinent Vitals/ Pain       Pain Assessment: Faces Faces Pain Scale: Hurts even more Breathing: normal Negative Vocalization: none Facial Expression: smiling or inexpressive Body Language: relaxed Consolability: no need to console PAINAD Score: 0 Facial Expression: Grimacing Body Movements: Absence of movements Muscle Tension: Relaxed Compliance with ventilator (intubated pts.): N/A Vocalization (extubated pts.): N/A CPOT Total: 2 Pain Location: abdomin and generalized Pain Descriptors / Indicators: Aching Pain Intervention(s): Monitored during session;Repositioned  Home Living                                          Prior Functioning/Environment              Frequency  Min 2X/week        Progress Toward Goals  OT Goals(current goals can now be found in the care plan section)  Progress towards OT goals: Progressing toward goals  Acute Rehab OT Goals OT Goal Formulation: With patient Time For Goal  Achievement: 03/18/21 Potential to Achieve Goals: Good ADL Goals Pt Will Perform Grooming: with set-up;sitting Pt Will Perform Upper Body Dressing: with set-up;sitting Pt Will Perform Lower Body Dressing: with mod assist;sit to/from stand Pt Will Transfer to Toilet: with supervision;ambulating;bedside commode Pt Will Perform Toileting - Clothing Manipulation and hygiene: with supervision;sit to/from stand Pt Will Perform Tub/Shower Transfer: Tub transfer;Shower transfer;3 in Energy manager will Perform Home Exercise Program: Increased ROM;Increased strength;Both right and left upper extremity Additional ADL Goal #1: Patient will perform bed mobility with Min A in prep for ADLs. Additional ADL Goal #2: [place] will complete sit to stand transfers with Mod A +2 in prep for ADLs. Additional ADL Goal #3: Patient will attend to 2/3 grooming tasks without cues for continuation indicating increased attention to task.  Plan Discharge plan remains appropriate;Frequency remains appropriate    Co-evaluation                 AM-PAC OT "6 Clicks" Daily Activity     Outcome Measure   Help from another person eating meals?: A Little Help from another person taking care of personal grooming?: A Little Help from another person toileting, which includes using toliet, bedpan, or urinal?: Total Help from another person bathing (including washing, rinsing, drying)?: Total Help from another person to put on and taking off regular upper body clothing?: A Lot Help from another  person to put on and taking off regular lower body clothing?: Total 6 Click Score: 11    End of Session Equipment Utilized During Treatment: Oxygen;Rolling walker (2 wheels)  OT Visit Diagnosis: Unsteadiness on feet (R26.81);Muscle weakness (generalized) (M62.81);Pain Pain - part of body:  (generalized and abdomin)   Activity Tolerance Patient tolerated treatment well   Patient Left in chair;with call  bell/phone within reach;with chair alarm set   Nurse Communication Mobility status        Time: 8250-5397 OT Time Calculation (min): 30 min  Charges: OT General Charges $OT Visit: 1 Visit OT Treatments $Self Care/Home Management : 8-22 mins $Therapeutic Exercise: 8-22 mins  Lodema Hong, OTA Acute Rehabilitation Services  Pager [phone] Office [place] 03/09/2021, 12:57 PM

## 2021-03-09 NOTE — Progress Notes (Signed)
Speech Language Pathology Treatment: Dysphagia  Patient Details Name: Thomas [name] MRN: [id number] DOB: 01/07/1956 Today's Date: 03/09/2021 Time: 1300-1340 SLP Time Calculation (min) (ACUTE ONLY): 40 min  Assessment / Plan / Recommendation Clinical Impression  Pt seen at bedside to assess readiness to advance from Dys1 (puree) solids. Pt trach is currently capped, with plan for decannulation this afternoon. Pt reports poor intake due to dislike of pureed textures. Pt was observed with thin liquid, puree, and solid textures. Timely oral prep and clearing observed, without overt s/s aspiration of any consistency given. Pt reports tolerance of PO meds whole with liquid. RN confirms. Will advance solid textures to dys 3, given improvement in strength, endurance, and speech intelligibility, as well as pt's upcoming decannulation. Safe swallow precautions were updated at [facility]. SLP will follow closely for assessment of advanced diet tolerance. RN and MD informed.   Of note, pt was fully intelligible today, and is independently using strategies to maximize clarity of speech (slow rate, exaggerated movements of lips and tongue). He feels he is making good progress.    HPI HPI: Pt is a 65 y/o male admitted on 7/25 for worsening sepsis vs reaction to chemotherapy with high ostomy output.  Pt developed worsening hypoxia and was requiring BIPAP w/ FIO2 of 100% and increased work of breathing. SLP consulted due to concern for aspiration. BSE 7/28 recommended NPO status and MBS, but this could not completed subsequently due to lethargy. CXR in AM of 7/30 showed some worsening concern of aspiration. ETT 7/30-8/8; tolerated nasal cannula for about an hour; reintubated 8/8-trach 8/11. Pt pulled out trach x2 8/22; replaced by PCCM. Bronch on 8/22 revealed endotracheal granulation tissue at trach site. Trach removed again on 8/25; unable to reinsert despite multiple attenpts; pt reintubated 8/25- trach by ENT on 8/29. BSE  8/22 limited to ice chips due to pt's refusal of other consistencies; rx NPO status with Cortrak.MRI brain 9/30: Multiple punctate foci of restricted diffusion in bilateral  frontal white matter, most likely acute infarcts. EGD 9/30:grade C reflux esophagitis/non-bleeding duodenal ulcers.Pt s/p PEG 10/4.  Trach changed to cuffed and pt transferred to ICU on 10/5 due to respiratory distress with mucous plug; bronch compled; vent 10/5-10/6. Bronch 10/5: Significant mucous plugging of the LUL with BAL and suctioning up until the subsegmental region. Trach replaced to #[id number] uncuffed. 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      SLP Plan  Continue with current plan of care      Recommendations for follow up therapy are one component of a multi-disciplinary discharge planning process, led by the attending physician.  Recommendations may be updated based on patient status, additional functional criteria and insurance authorization.    Recommendations  Diet recommendations: Dysphagia 3 (mechanical soft);Thin liquid Liquids provided via: Cup;Straw Medication Administration: Whole meds with liquid Supervision: Patient able to self feed Compensations: Slow rate;Small sips/bites Postural Changes and/or Swallow Maneuvers: Seated upright 90 degrees;Upright 30-60 min after meal                Oral Care Recommendations: Oral care BID Follow up Recommendations: Skilled Nursing facility SLP Visit Diagnosis: Dysphagia, oropharyngeal phase (R13.12) Plan: Continue with current plan of care       GO  Saranda Legrande B. Quentin Ore, [place], [place] Speech Language Pathologist Office: [phone]  Shonna Chock  03/09/2021, 1:47 PM

## 2021-03-10 DIAGNOSIS — E1100 Type 2 diabetes mellitus with hyperosmolarity without nonketotic hyperglycemic-hyperosmolar coma (NKHHC): Secondary | ICD-10-CM | POA: Diagnosis not present

## 2021-03-10 LAB — RENAL FUNCTION PANEL
Albumin: 2 g/dL — ABNORMAL LOW (ref 3.5–5.0)
Anion gap: 5 (ref 5–15)
BUN: 32 mg/dL — ABNORMAL HIGH (ref 8–23)
CO2: 22 mmol/L (ref 22–32)
Calcium: 7.7 mg/dL — ABNORMAL LOW (ref 8.9–10.3)
Chloride: 109 mmol/L (ref 98–111)
Creatinine, Ser: 2.47 mg/dL — ABNORMAL HIGH (ref 0.61–1.24)
GFR, Estimated: 28 mL/min — ABNORMAL LOW (ref >60)
Glucose, Bld: 124 mg/dL — ABNORMAL HIGH (ref 70–99)
Phosphorus: 2.6 mg/dL (ref 2.5–4.6)
Potassium: 3.5 mmol/L (ref 3.5–5.1)
Sodium: 136 mmol/L (ref 135–145)

## 2021-03-10 LAB — GLUCOSE, CAPILLARY
Glucose-Capillary: 136 mg/dL — ABNORMAL HIGH (ref 70–99)
Glucose-Capillary: 120 mg/dL — ABNORMAL HIGH (ref 70–99)
Glucose-Capillary: 121 mg/dL — ABNORMAL HIGH (ref 70–99)
Glucose-Capillary: 133 mg/dL — ABNORMAL HIGH (ref 70–99)
Glucose-Capillary: 125 mg/dL — ABNORMAL HIGH (ref 70–99)
Glucose-Capillary: 116 mg/dL — ABNORMAL HIGH (ref 70–99)
Glucose-Capillary: 122 mg/dL — ABNORMAL HIGH (ref 70–99)

## 2021-03-10 NOTE — NC FL2 (Signed)
[place] MEDICAID FL2 LEVEL OF CARE SCREENING TOOL     IDENTIFICATION  Patient Name: Thomas [name] Birthdate: March 16, 1956 Sex: male Admission Date (Current Location): 11/30/2020  [facility] and [place] Number:  Herbalist and Address:  [facility] [place], [place] [address] [place], [place] [address]      Provider Number: [id number]  Attending Physician Name and Address:  Domenic Polite, MD  Relative Name and Phone Number:  Blue Winther - [phone]    Current Level of Care: Hospital Recommended Level of Care: [place] Prior Approval Number:    Date Approved/Denied:  (PASRR approved on 03/05/2021) PASRR Number: [id number]  Discharge Plan: SNF    Current Diagnoses: Patient Active Problem List   Diagnosis Date Noted   Cerebral embolism with cerebral infarction 02/06/2021   Acute esophagitis    Duodenal ulcer disease    Non-intractable vomiting    Abdominal pain, epigastric    SOB (shortness of breath)    Endotracheal tube present    Hemodialysis status ([place])    Palliative care by specialist    Status post tracheostomy ([place])    Acute respiratory failure with hypoxemia ([place]) 12/07/2020   Acute renal failure with acute cortical necrosis ([place])    Shock ([place])    Acute metabolic encephalopathy 56/25/6389   Malnutrition of moderate degree 12/03/2020   Atrial fibrillation with RVR ([place])    Sepsis ([place])    Tachycardia 12/01/2020   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 ([place]) 12/01/2020   Adverse effect of chemotherapy 12/01/2020   Pressure injury of skin 12/01/2020   Acute renal failure superimposed on stage 2 chronic kidney disease ([place]) 10/01/2020   Hyponatremia 10/01/2020   Iron deficiency anemia due to chronic blood loss 37/34/2876   Metabolic acidosis 81/15/7262   Colon cancer ([place]) 09/23/2020   Colostomy in place [facility]) 09/11/2020   History of ischemic left MCA stroke 2015 09/11/2020   Obstructing  transverse colon cancer s/p [place] resection/colostomy 09/10/2020    Rectal polyp    Diverticulosis of colon without hemorrhage    Abdominal pain 09/04/2020   Diabetic polyneuropathy associated with type 2 diabetes mellitus ([place]) 06/28/2018   Venous insufficiency of both lower extremities 06/28/2018   S/P craniotomy 09/01/2016   Meningioma ([place]) 09/01/2016   Trauma    Diabetes mellitus with hyperosmolarity without hyperglycemic hyperosmolar nonketotic coma ([place])    Essential hypertension    Acute encephalopathy 04/09/2014   Lactic acidosis 04/09/2014   Hyperammonemia ([place]) 04/09/2014    Orientation RESPIRATION BLADDER Height & Weight     Self, Time, Situation, Place  Normal Indwelling catheter Weight: 108.3 kg Height:  5\' 8"  (172.7 cm)  BEHAVIORAL SYMPTOMS/MOOD NEUROLOGICAL BOWEL NUTRITION STATUS    Convulsions/Seizures Colostomy, Continent Feeding tube  AMBULATORY STATUS COMMUNICATION OF NEEDS Skin   Extensive Assist Verbally Other (Comment)                       Personal Care Assistance Level of Assistance  Bathing, Feeding, Dressing Bathing Assistance: Limited assistance Feeding assistance: Limited assistance Dressing Assistance: Limited assistance     Functional Limitations Info  Sight, Hearing, Speech Sight Info: Adequate Hearing Info: Adequate Speech Info: Adequate    SPECIAL CARE FACTORS FREQUENCY  PT (By licensed PT), OT (By licensed OT)     PT Frequency: 5 x per week OT Frequency: 5 x per week            Contractures  Contractures Info: Not present    Additional Factors Info  Code Status, Allergies, Psychotropic, Insulin Sliding Scale Code Status Info: Full code Allergies Info: Morphine and related Psychotropic Info: Klonopin Insulin Sliding Scale Info: See Discharge Summary       Current Medications (03/10/2021):  This is the current hospital active medication list Current Facility-Administered Medications  Medication Dose Route Frequency  Provider Last Rate Last Admin   0.9 %  sodium chloride infusion   Intravenous PRN Renee Pain, MD 10 mL/hr at 02/25/21 1021 250 mL at 02/25/21 1021   acetaminophen (TYLENOL) suppository 650 mg  650 mg Rectal Q4H PRN Samella Parr, NP       albuterol (PROVENTIL) (2.5 MG/3ML) 0.083% nebulizer solution 2.5 mg  2.5 mg Nebulization Q4H PRN Spero Geralds, MD   2.5 mg at 02/13/21 1127   anticoagulant sodium citrate solution 5 mL  5 mL Intracatheter Q T,Th,Sa-HD Laurin Coder, MD   Held at 03/07/21 1412   apixaban (ELIQUIS) tablet 5 mg  5 mg Per Tube BID Samella Parr, NP   5 mg at 03/10/21 0954   atorvastatin (LIPITOR) tablet 20 mg  20 mg Oral QHS Samella Parr, NP   20 mg at 03/09/21 2134   chlorhexidine (PERIDEX) 0.12 % solution 15 mL  15 mL Mouth Rinse BID Elgergawy, Silver Huguenin, MD   15 mL at 03/10/21 0955   Chlorhexidine Gluconate Cloth 2 % PADS 6 each  6 each Topical Q0600 Rosita Fire, MD   6 each at 03/09/21 7510   Chlorhexidine Gluconate Cloth 2 % PADS 6 each  6 each Topical Q0600 Corliss Parish, MD   6 each at 03/10/21 0955   clonazePAM (KLONOPIN) tablet 0.5 mg  0.5 mg Per Tube BID Samella Parr, NP   0.5 mg at 03/10/21 0954   [START ON 03/13/2021] Darbepoetin Alfa (ARANESP) injection 100 mcg  100 mcg Subcutaneous Q Fri-1800 Justin Mend, MD       dextrose 50 % solution 0-50 mL  0-50 mL Intravenous PRN Renee Pain, MD   50 mL at 01/01/21 1134   diclofenac Sodium (VOLTAREN) 1 % topical gel 4 g  4 g Topical QID Samella Parr, NP   4 g at 03/09/21 0905   feeding supplement (NEPRO CARB STEADY) liquid 1,000 mL  1,000 mL Per Tube Continuous Samella Parr, NP       fentaNYL (SUBLIMAZE) injection 50-100 mcg  50-100 mcg Intravenous Q4H PRN Samella Parr, NP   50 mcg at 03/01/21 2109   fiber (NUTRISOURCE FIBER) 1 packet  1 packet Per Tube BID Samella Parr, NP   1 packet at 03/10/21 1020   gabapentin (NEURONTIN) 250 MG/5ML solution 100 mg  100 mg Per  Tube QHS Samella Parr, NP   100 mg at 03/09/21 2146   glycopyrrolate (ROBINUL) injection 0.1 mg  0.1 mg Intravenous Q4H PRN Richarda Osmond, MD   0.1 mg at 02/24/21 2123   hydrocerin (EUCERIN) cream   Topical BID Ladell Pier, MD   Given at 03/10/21 1020   insulin aspart (novoLOG) injection 0-15 Units  0-15 Units Subcutaneous Q4H Cristal Generous, NP   2 Units at 03/10/21 1020   levETIRAcetam (KEPPRA) 100 MG/ML solution 500 mg  500 mg Per Tube BID Samella Parr, NP   500 mg at 03/10/21 0955   lip balm (CARMEX) ointment   Topical PRN Kerney Elbe, DO  loperamide HCl (IMODIUM) 1 MG/7.5ML suspension 1 mg  1 mg Per Tube Daily Samella Parr, NP   1 mg at 03/10/21 1021   LORazepam (ATIVAN) injection 0.5 mg  0.5 mg Intravenous Q4H PRN Samella Parr, NP   0.5 mg at 03/02/21 2330   MEDLINE mouth rinse  15 mL Mouth Rinse q12n4p Elgergawy, Silver Huguenin, MD   15 mL at 03/09/21 1757   metoprolol tartrate (LOPRESSOR) injection 5 mg  5 mg Intravenous Q4H PRN Samella Parr, NP       ondansetron [facility]) injection 4 mg  4 mg Intravenous Q6H PRN Candee Furbish, MD   4 mg at 02/28/21 2041   oxyCODONE (ROXICODONE) 5 MG/5ML solution 2.5 mg  2.5 mg Per Tube Q6H PRN Samella Parr, NP   2.5 mg at 03/04/21 0849   pantoprazole sodium (PROTONIX) 40 mg/20 mL oral suspension 40 mg  40 mg Per Tube Daily Samella Parr, NP   40 mg at 03/10/21 1009   promethazine (PHENERGAN) 12.5 mg in sodium chloride 0.9 % 50 mL IVPB  12.5 mg Intravenous Q6H PRN Chotiner, Yevonne Aline, MD 200 mL/hr at 02/24/21 1950 12.5 mg at 02/24/21 1950   sodium chloride flush (NS) 0.9 % injection 10-40 mL  10-40 mL Intracatheter PRN Arrien, Jimmy Picket, MD   10 mL at 03/02/21 2159     Discharge Medications: Please see discharge summary for a list of discharge medications.  Relevant Imaging Results:  Relevant Lab Results:   Additional Information SS#  [id number]  Curlene Labrum, RN

## 2021-03-10 NOTE — Progress Notes (Signed)
PROGRESS NOTE    Thomas [name]  WUJ:[id number] DOB: Jan 16, 1956 DOA: 11/30/2020 PCP: [name], NP    Chief Complaint  Patient presents with   Abdominal Pain    Brief Narrative:   Thomas [name] is a 65 y.o. with a pertinent PMH of CVA, colon cancer s/p colectomy/colostomy, T2DM, ESRD (on HD), Epilepsy, HTN, HLD, OSA, DVT (lovenox), who presented in August  after recent surgery for colon cancer s/p resection with signs and symptoms of sepsis and worsening respiratory distress  admitted for aspiration pneumonia. Hospital course complicated by ischemic colitis and prolonged respiratory failure requiring tracheostomy, patient with very complicated hospital stay, please see below. -Slowly improving -10/31, stable without HD for 1 week, HD catheter was removed, also underwent trach decannulation   .7/30 AM Worsened AME, more hypoxic with worsening renal funciton, new aspiration on abx restarted 7/30 PM Pt with increased o2 requirement on 100% FIO2 on BIPAP, d/w family who wanted to proceed with intubation, pt intubated 7/31 Tracheal aspirate grew moderate candida tropicalis  8/2 down to 9 mcg of Levophed, however repeat CT abdomen/pelvis showed inflammatory wall thickening and fat stranding of the descending and sigmoid colon, new since prior, consistent with nonspecific infectious, inflammatory, or ischemic colitis.  Also new dense consolidative airspace opacity in the right lower and right middle lobes 8/3 further weaning NE. Weaning sedation. Full code, full scope per family meeting 8/2. Eraxis stopped (8/1-8/3) 8/4 off pressors, plt down to 103.  8/5 remains off pressors. Moving around more, not really following commands though  8/9 failed extubation 8/8, tolerated nasal cannula for about an hour, desaturations and inability to clear secretions-reintubated 8/8 8/10 some agitation overnight 8/11 tracheostomy placement On trach collar since 8/16 8/22 pulled trach out, replaced under bronch  guidance. Has proximal area of granulation tissue that is above the bottom of the trach tube.  8/25 Removed Trach, unable to reinsert, despite multiple attempts re-intubated 8/29 Anemia, 1 unit PRBC. ENT re-placed Trach in [place]. 9/8 tracheal aspirate growing pan-sensitive P. Aeruginosa. Started on Ceftazidine  9/19 Continues to have thick secretions.  9/30 EGD significant for duodenal ulcer, nonbleeding. 10/5 had respiratory distress and increase in O2 requirements, frequent suctioning. Ct chest showed large mucus plug in the Lft upper/lower lobes, transferred to the ICU for mechanical ventilation. 10/6 wean sedation and SBT via trach collar 10/7, patient pulled his trach was inserted by RT with no complications. 10/31: Trach decannulated and HD catheter removed    Assessment & Plan:   Acute hypoxic respiratory failure status post tracheostomy -Pulled his tracheostomy multiple times, his tracheostomy is complicated by intratracheal granulation tissue, with difficult to place trach, ENT changed trach on 8/29 to cuffless trach. -Had multiple episodes of aspiration and mucous plugging, s/p bronchoscopy with resolution of his mucous plug, completed multiple rounds of antibiotics -Stable on trach collar, was tolerating PMV with SLP -PCCM following,, underwent trach decannulation yesterday 10/31, PCCM recommended ENT referral if stoma not closed after 4 weeks to consider surgical closure -Discharge planning, TOC/LLOS following  AKI, ischemic ATN New ESRD -Initially started on CRRT, transition to intermittent hemodialysis on 8/12 -Nephrology following, HD on hold since 10/25, urine output is good and creatinine is relatively stable -Creatinine improving and stable, urine output remains good -HD catheter removed 10/31, nephrology recommended follow-up after discharge  Dysphagia/reflux esophagitis/nonbleeding duodenal ulcers -EGD 9/30 with the above findings difficult for esophagitis and  nonbleeding duodenal ulcer., -Continue PPI for 4 weeks, needs follow-up EGD after discharge  MRI with  bilateral punctate lesion (Frontal watershed) -SLP evaluated and noted patient is unable to elevate tongue properly bilaterally-continues to have difficulty with swallowing and phonation -Imaging consistent with acute infarcts -Stroke team following-etiology of infarcts yet to be determined-differential includes thrombotic etiology from either A. fib or from DVT if has PFO -Neurology signed off  Acute extensive right leg DVT:  -Continue Eliquis   New onset atrial fibrillation with RVR:  -Now in sinus rhythm, continue Eliquis  Anemia -Secondary to chronic disease and previous GI bleed -Now stable, monitor periodically  Stage IIIb colon CA s/p partial transverse colectomy with end colostomy May 2022:  - Palliative care has had multiple conversations with patient and wife and currently want to pursue aggressive care -Xeloda on hold secondary to acute illness  Recurrent urinary retention -Had recurrent retention after discontinuing Foley catheter, this was reinserted on 10/10 -He will need urology follow-up   DVT prophylaxis: Eliquis Code Status: Full Family Communication: Discussed patient in detail, no family at bedside Disposition:   Status is: Inpatient  Remains inpatient appropriate because:Unsafe d/c plan  Dispo: The patient is from: Home              Anticipated d/c is to: SNF              Patient currently is not medically stable to d/c.   Difficult to place patient Yes    Consultants: PCCM General surgery Nephrology Hematology/oncology Palliative care medicine team   Procedures: 2u PRBC 7/31, 1u PRBC 8/17, 1u PRBC 8/27 Tracheostomy revision 01/05/2021 Dr. Constance Holster [facility] 8/22        Subjective: -Feels okay, denies any complaints, was decannulated yesterday, HD catheter removed   Objective: Vitals:   03/09/21 2154 03/09/21 2325 03/10/21 0401 03/10/21 1203   BP: (!) 174/92   (!) 141/94  Pulse: 91 88 94 (!) 102  Resp: 18 14 16    Temp: 98 F (36.7 C)   98.3 F (36.8 C)  TempSrc: Oral   Oral  SpO2: 100% 100% 98% 100%  Weight:      Height:        Intake/Output Summary (Last 24 hours) at 03/10/2021 1229 Last data filed at 03/10/2021 1027 Gross per 24 hour  Intake 120 ml  Output 675 ml  Net -555 ml   Filed Weights   03/07/21 0307 03/08/21 0500 03/09/21 0500  Weight: 108.4 kg 108.9 kg 108.3 kg    Examination:  Pleasant middle-aged male laying in bed, awake alert oriented to self and place HEENT: Decannulated, dressing on the stoma CVS: S1-S2, regular rate rhythm Lungs: Decreased breath sounds the bases otherwise clear Abdomen: Soft, nontender, bowel sounds present, colostomy with brown stool Extremities: No edema  Data Reviewed: I have personally reviewed following labs and imaging studies  CBC: Recent Labs  Lab 03/07/21 0545  WBC 10.6*  HGB 8.4*  HCT 24.1*  MCV 80.9  PLT 619    Basic Metabolic Panel: Recent Labs  Lab 03/06/21 0523 03/07/21 0545 03/08/21 0500 03/09/21 0455 03/10/21 1050  NA 134* 132* 133* 134* 136  K 3.4* 3.4* 3.4* 4.2 3.5  CL 99 98 98 102 109  CO2 27 26 26 26 22   GLUCOSE 124* 124* 130* 99 124*  BUN 26* 29* 33* 34* 32*  CREATININE 2.95* 2.90* 2.95* 2.95* 2.47*  CALCIUM 9.1 8.9 9.0 9.2 7.7*  PHOS 2.7 3.2 2.8 3.1 2.6    GFR: Estimated Creatinine Clearance: 36.1 mL/min (A) (by C-G formula based on SCr of 2.47 mg/dL (  H)).  Liver Function Tests: Recent Labs  Lab 03/06/21 0523 03/07/21 0545 03/08/21 0500 03/09/21 0455 03/10/21 1050  ALBUMIN 2.2* 2.2* 2.3* 2.4* 2.0*    CBG: Recent Labs  Lab 03/09/21 2314 03/10/21 0330 03/10/21 0748 03/10/21 1011 03/10/21 1158  GLUCAP 128* 136* 120* 121* 133*     No results found for this or any previous visit (from the past 240 hour(s)).   Scheduled Meds:  apixaban  5 mg Per Tube BID   atorvastatin  20 mg Oral QHS   chlorhexidine  15 mL  Mouth Rinse BID   Chlorhexidine Gluconate Cloth  6 each Topical Q0600   Chlorhexidine Gluconate Cloth  6 each Topical Q0600   clonazePAM  0.5 mg Per Tube BID   [START ON 03/13/2021] darbepoetin (ARANESP) injection - NON-DIALYSIS  100 mcg Subcutaneous Q Fri-1800   diclofenac Sodium  4 g Topical QID   fiber  1 packet Per Tube BID   gabapentin  100 mg Per Tube QHS   hydrocerin   Topical BID   insulin aspart  0-15 Units Subcutaneous Q4H   levETIRAcetam  500 mg Per Tube BID   loperamide HCl  1 mg Per Tube Daily   mouth rinse  15 mL Mouth Rinse q12n4p   pantoprazole sodium  40 mg Per Tube Daily   Continuous Infusions:  sodium chloride 250 mL (02/25/21 1021)   anticoagulant sodium citrate Stopped (03/07/21 1412)   feeding supplement (NEPRO CARB STEADY) 1,000 mL (03/10/21 1102)   promethazine (PHENERGAN) injection (IM or IVPB) 12.5 mg (02/24/21 1950)     LOS: 99 days    Domenic Polite, MD [facility]  03/10/2021, 12:29 PM

## 2021-03-10 NOTE — Progress Notes (Signed)
Speech Language Pathology Treatment: Dysphagia  Patient Details Name: Thomas [name] MRN: [id number] DOB: [birth year] Today's Date: 03/10/2021 Time: 9030-0923 SLP Time Calculation (min) (ACUTE ONLY): 20 min  Assessment / Plan / Recommendation Clinical Impression  Pt seen at bedside for assessment of advance solid textures s/p decannulation. Pt's wife was at bedside. Pt reports no difficulty with solids, liquids, or PO meds since decannulation. Pt was observed eating graham crackers and drinking grape juice. Timely oral prep and clearing noted, without overt s/s aspiration. Will continue current diet, largely due to poor dentition. No further skilled ST intervention is recommended at this time. Please reconsult if needs arise.    HPI HPI: Pt is a 65 y/o male admitted on 7/25 for worsening sepsis vs reaction to chemotherapy with high ostomy output.  Pt developed worsening hypoxia and was requiring BIPAP w/ FIO2 of 100% and increased work of breathing. SLP consulted due to concern for aspiration. BSE 7/28 recommended NPO status and MBS, but this could not completed subsequently due to lethargy. CXR in AM of 7/30 showed some worsening concern of aspiration. ETT 7/30-8/8; tolerated nasal cannula for about an hour; reintubated 8/8-trach 8/11. Pt pulled out trach x2 8/22; replaced by PCCM. Bronch on 8/22 revealed endotracheal granulation tissue at trach site. Trach removed again on 8/25; unable to reinsert despite multiple attenpts; pt reintubated 8/25- trach by ENT on 8/29. BSE 8/22 limited to ice chips due to pt's refusal of other consistencies; rx NPO status with Cortrak.MRI brain 9/30: Multiple punctate foci of restricted diffusion in bilateral  frontal white matter, most likely acute infarcts. EGD 9/30:grade C reflux esophagitis/non-bleeding duodenal ulcers.Pt s/p PEG 10/4.  Trach changed to cuffed and pt transferred to ICU on 10/5 due to respiratory distress with mucous plug; bronch compled; vent 10/5-10/6.  Bronch 10/5: Significant mucous plugging of the LUL with BAL and suctioning up until the subsegmental region. Trach replaced to #[id number] uncuffed. 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      SLP Plan  Discharge SLP treatment due to goals met      Recommendations for follow up therapy are one component of a multi-disciplinary discharge planning process, led by the attending physician.  Recommendations may be updated based on patient status, additional functional criteria and insurance authorization.    Recommendations  Diet recommendations: Dysphagia 3 (mechanical soft);Thin liquid Liquids provided via: Straw Medication Administration: Whole meds with liquid Supervision: Patient able to self feed Compensations: Slow rate;Small sips/bites Postural Changes and/or Swallow Maneuvers: Seated upright 90 degrees                Oral Care Recommendations: Oral care BID SLP Visit Diagnosis: Dysphagia, oropharyngeal phase (R13.12) Plan: Discharge SLP treatment due to (comment)       GO               Chai Routh B. Quentin Ore, [facility], [place] Speech Language Pathologist Office: [phone]  Shonna Chock  03/10/2021, 12:47 PM

## 2021-03-10 NOTE — Progress Notes (Signed)
Physical Therapy Treatment Patient Details Name: Thomas [name] MRN: [id number] DOB: 1955-08-12 Today's Date: 03/10/2021   History of Present Illness 65 y.o. male admitted 7/24 with high ostomy output. Pt with severe sepsis and multisystem organ failure. Hospital admission complicated by volume overload, aspiration pneumonia, acute respiratory failure, AKI and ischemic colitis. Intubated 7/30. S/p trach placement 8/11. CorTrak 8/12. HD initiated 8/12. To IR 8/15 for tunneled HD cath. Pulled out trach x2 8/22 replaced by PCCM. Pulled out trach again 8/25 but unable to be reinserted with pt intubated. 8/29 to OR for stoma clean up and trach replacement.Trach downsized 9/10. 10/5 respiratory distress with mucous plug requiring bronch, trach change and ventilator 10/5-10/6.  10/6 transitioned to trach collar, 10/7 pulled trach and inserted by RT with no complications. MD held HD 10/26 given increased urine output. Trach decannulated and HD catheter removed 10/31. 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    PT Comments    Pt up in chair upon PT arrival to room, reports fatigue from multiple hours up in recliner. Pt tolerated x3 sit<>stands with focus on tolerance for dynamic standing activities (ie marching in place, pivotal stepping). Pt fatigues in <1 minute, requiring seated rest. Pt is motivated to progress mobility, plan for initiating gait training with close chair follow next session. PT to continue to follow.    Recommendations for follow up therapy are one component of a multi-disciplinary discharge planning process, led by the attending physician.  Recommendations may be updated based on patient status, additional functional criteria and insurance authorization.  Follow Up  Recommendations  Skilled nursing-short term rehab (<3 hours/day)     Assistance Recommended at Discharge Intermittent Supervision/Assistance  Equipment Recommendations  Wheelchair (measurements PT);Wheelchair cushion (measurements PT);Hospital bed    Recommendations for Other Services       Precautions / Restrictions Precautions Precautions: Fall Precaution Comments: ostomy, peg; L heel wound Restrictions Weight Bearing Restrictions: No     Mobility  Bed Mobility Overal bed mobility: Needs Assistance Bed Mobility: Sit to Supine       Sit to supine: Mod assist;+2 for physical assistance   General bed mobility comments: mod +2 for trunk lowering and LE lifting, boost up in bed with bed pads.    Transfers Overall transfer level: Needs assistance Equipment used: Rolling walker (2 wheels) Transfers: Sit to/from [facility] Sit to Stand: Min assist;+2 safety/equipment Stand pivot transfers: Min assist;+2 safety/equipment         General transfer comment: min +2 for power up, rise, steady, and stand pivot to reach bed. Pivotal steps x4 to get to EOB. STS x3, from recliner each time. Cues for hand placement    Ambulation/Gait                 Stairs             Wheelchair Mobility    Modified Rankin (Stroke Patients Only)       Balance Overall balance assessment: Needs assistance Sitting-balance support: No upper extremity supported;Feet supported Sitting balance-Leahy Scale: Fair Sitting balance - Comments: able to sit EOB without support, scoot posteriorly and laterally   Standing balance support: Bilateral upper extremity supported Standing balance-Leahy Scale: Poor Standing balance comment: reliant on UE support of walker  Cognition Arousal/Alertness: Awake/alert Behavior During Therapy: WFL for tasks assessed/performed Overall Cognitive Status: Impaired/Different from baseline Area of  Impairment: Attention;Following commands;Memory;Safety/judgement;Problem solving                   Current Attention Level: Selective Memory: Decreased short-term memory Following Commands: Follows one step commands with increased time Safety/Judgement: Decreased awareness of deficits   Problem Solving: Slow processing General Comments: Pt points out all grandchildren and tells PT their ages today. Pt reports fatigue, becomes tearful at one point secondary to fatigue but states he want to do therapy to progress.        Exercises General Exercises - Lower Extremity Hip Flexion/Marching: AROM;Both;10 reps;Standing (x2 sets)    General Comments General comments (skin integrity, edema, etc.): vss      Pertinent Vitals/Pain Pain Assessment: Faces Faces Pain Scale: Hurts little more Pain Location: heels L>R (with wound, RN aware) Pain Descriptors / Indicators: Sore;Discomfort;Moaning Pain Intervention(s): Limited activity within patient's tolerance;Monitored during session;Repositioned    Home Living                          Prior Function            PT Goals (current goals can now be found in the care plan section) Acute Rehab PT Goals Patient Stated Goal: to walk again PT Goal Formulation: With patient Time For Goal Achievement: 03/25/21 Potential to Achieve Goals: Good Progress towards PT goals: Progressing toward goals    Frequency    Min 2X/week      PT Plan Current plan remains appropriate    Co-evaluation              AM-PAC PT "6 Clicks" Mobility   Outcome Measure  Help needed turning from your back to your side while in a flat bed without using bedrails?: A Little Help needed moving from lying on your back to sitting on the side of a flat bed without using bedrails?: A Little Help needed moving to and from a bed to a chair (including a wheelchair)?: A Little Help needed standing up from a chair using your arms (e.g., wheelchair or  bedside chair)?: A Little Help needed to walk in hospital room?: A Lot Help needed climbing 3-5 steps with a railing? : A Lot 6 Click Score: 16    End of Session Equipment Utilized During Treatment: Gait belt Activity Tolerance: Patient tolerated treatment well;Patient limited by fatigue Patient left: in bed;with call bell/phone within reach;with bed alarm set Nurse Communication: Mobility status;Other (comment) (weeping L heel wound) PT Visit Diagnosis: Muscle weakness (generalized) (M62.81);Other abnormalities of gait and mobility (R26.89)     Time: 1433-1450 PT Time Calculation (min) (ACUTE ONLY): 17 min  Charges:  $Therapeutic Activity: 8-22 mins                    Stacie Glaze, PT DPT Acute Rehabilitation Services Pager [phone]  Office [phone]    [name] 03/10/2021, 5:08 PM

## 2021-03-10 NOTE — Progress Notes (Signed)
PCCM:  Seen and evaluated for trach care.  S: Decannulated yesterday. Doing well. Working on gaining strength back  O: BP (!) 141/94 (BP Location: Left Arm)   Pulse (!) 102   Temp 98.3 F (36.8 C) (Oral)   Resp 16   Ht 5\' 8"  (1.727 m)   Wt 108.3 kg   SpO2 100%   BMI 36.30 kg/m   No distress Stoma dressed Strong voice Profoundly weak Lungs clear  A: Chronic respiratory failure s/p trach  Colon cancer Deconditioned  ESRD   P: No issues after decannulation If no closure of stoma after 4 weeks refer to ENT to consider surgical closure PCCM available PRN  Candee Furbish, MD [facility] 03/10/2021 12:10 PM

## 2021-03-10 NOTE — Plan of Care (Signed)
  Problem: Health Behavior/Discharge Planning: ?Goal: Ability to manage health-related needs will improve ?Outcome: Progressing ?  ?Problem: Clinical Measurements: ?Goal: Ability to maintain clinical measurements within normal limits will improve ?Outcome: Progressing ?Goal: Will remain free from infection ?Outcome: Progressing ?Goal: Diagnostic test results will improve ?Outcome: Progressing ?Goal: Respiratory complications will improve ?Outcome: Progressing ?Goal: Cardiovascular complication will be avoided ?Outcome: Progressing ?  ?Problem: Activity: ?Goal: Risk for activity intolerance will decrease ?Outcome: Progressing ?  ?Problem: Nutrition: ?Goal: Adequate nutrition will be maintained ?Outcome: Progressing ?  ?Problem: Coping: ?Goal: Level of anxiety will decrease ?Outcome: Progressing ?  ?Problem: Elimination: ?Goal: Will not experience complications related to bowel motility ?Outcome: Progressing ?Goal: Will not experience complications related to urinary retention ?Outcome: Progressing ?  ?Problem: Pain Managment: ?Goal: General experience of comfort will improve ?Outcome: Progressing ?  ?Problem: Safety: ?Goal: Ability to remain free from injury will improve ?Outcome: Progressing ?  ?Problem: Skin Integrity: ?Goal: Risk for impaired skin integrity will decrease ?Outcome: Progressing ?  ?Problem: Safety: ?Goal: Non-violent Restraint(s) ?Outcome: Progressing ?  ?

## 2021-03-10 NOTE — TOC Progression Note (Signed)
Transition of Care [facility]) - Progression Note    Patient Details  Name: Thomas [name] MRN: [id number] Date of Birth: January 14, 1956  Transition of Care [facility]) CM/SW [place], RN Phone Number: 03/10/2021, 1:53 PM  Clinical Narrative:    Case management spoke with Wardell Honour, CM at [facility] in [place] and considering patient's tolerance for Pt/OT is less than 3 hours - SNF placement is recommended for rehabilitation.  CM called and spoke with the patient's wife, Thomas [name] and she was made aware that SNF placement is recommended by PT/OT and the patient has a bed offer at [facility].  The wife states that she is agreeable to speak with Juliann Pulse, CM at the facility and insurance authorization will be started once patient's wife accepts the offer.  Kath, Wright with [facility] was given the wife's contact information and CM will follow up.   Expected Discharge Plan: [place] Barriers to Discharge: Continued Medical Work up  Expected Discharge Plan and Services Expected Discharge Plan: [place]   Discharge Planning Services: CM Consult Post Acute Care Choice: IP Rehab (IP Rehab versus SNF for rehab - has tracheostomy and HD care needs.) Living arrangements for the past 2 months: Single Family Home                                       Social Determinants of Health (SDOH) Interventions    Readmission Risk Interventions Readmission Risk Prevention Plan 02/16/2021  Transportation Screening Complete  Medication Review [employer]) Complete  PCP or Specialist appointment within 3-5 days of discharge Complete  HRI or Home Care Consult Complete  SW Recovery Care/Counseling Consult Complete  Palliative Care Screening Complete  Skilled Nursing Facility Complete  Some recent data might be hidden

## 2021-03-11 ENCOUNTER — Other Ambulatory Visit (HOSPITAL_COMMUNITY): Payer: Self-pay

## 2021-03-11 DIAGNOSIS — E785 Hyperlipidemia, unspecified: Secondary | ICD-10-CM

## 2021-03-11 DIAGNOSIS — E1100 Type 2 diabetes mellitus with hyperosmolarity without nonketotic hyperglycemic-hyperosmolar coma (NKHHC): Secondary | ICD-10-CM | POA: Diagnosis not present

## 2021-03-11 DIAGNOSIS — G629 Polyneuropathy, unspecified: Secondary | ICD-10-CM

## 2021-03-11 DIAGNOSIS — G40909 Epilepsy, unspecified, not intractable, without status epilepticus: Secondary | ICD-10-CM

## 2021-03-11 LAB — RENAL FUNCTION PANEL
Albumin: 2.6 g/dL — ABNORMAL LOW (ref 3.5–5.0)
Anion gap: 8 (ref 5–15)
BUN: 37 mg/dL — ABNORMAL HIGH (ref 8–23)
CO2: 25 mmol/L (ref 22–32)
Calcium: 9.4 mg/dL (ref 8.9–10.3)
Chloride: 101 mmol/L (ref 98–111)
Creatinine, Ser: 2.7 mg/dL — ABNORMAL HIGH (ref 0.61–1.24)
GFR, Estimated: 26 mL/min — ABNORMAL LOW
Glucose, Bld: 122 mg/dL — ABNORMAL HIGH (ref 70–99)
Phosphorus: 3.5 mg/dL (ref 2.5–4.6)
Potassium: 4.2 mmol/L (ref 3.5–5.1)
Sodium: 134 mmol/L — ABNORMAL LOW (ref 135–145)

## 2021-03-11 LAB — CBC
HCT: 25.8 % — ABNORMAL LOW (ref 39.0–52.0)
Hemoglobin: 8.7 g/dL — ABNORMAL LOW (ref 13.0–17.0)
MCH: 27.5 pg (ref 26.0–34.0)
MCHC: 33.7 g/dL (ref 30.0–36.0)
MCV: 81.6 fL (ref 80.0–100.0)
Platelets: 362 10*3/uL (ref 150–400)
RBC: 3.16 MIL/uL — ABNORMAL LOW (ref 4.22–5.81)
RDW: 14.6 % (ref 11.5–15.5)
WBC: 10.8 10*3/uL — ABNORMAL HIGH (ref 4.0–10.5)
nRBC: 0 % (ref 0.0–0.2)

## 2021-03-11 LAB — GLUCOSE, CAPILLARY
Glucose-Capillary: 132 mg/dL — ABNORMAL HIGH (ref 70–99)
Glucose-Capillary: 112 mg/dL — ABNORMAL HIGH (ref 70–99)
Glucose-Capillary: 90 mg/dL (ref 70–99)
Glucose-Capillary: 113 mg/dL — ABNORMAL HIGH (ref 70–99)

## 2021-03-11 MED ORDER — OXYCODONE HCL 5 MG PO TABS: 2.5000 mg | ORAL_TABLET | ORAL | 0 refills | Status: DC | PRN

## 2021-03-11 MED ORDER — LOPERAMIDE HCL 2 MG PO CAPS: 2.0000 mg | ORAL_CAPSULE | ORAL | Status: DC | PRN

## 2021-03-11 MED ORDER — APIXABAN 5 MG PO TABS
5.0000 mg | ORAL_TABLET | Freq: Two times a day (BID) | ORAL | 6 refills | Status: DC
  Filled 2021-03-11: qty 60, 30d supply, fill #0

## 2021-03-11 MED ORDER — ENSURE ENLIVE PO LIQD
237.0000 mL | Freq: Three times a day (TID) | ORAL | Status: DC
  Administered 2021-03-11 – 2021-03-13 (×4): 237 mL via ORAL

## 2021-03-11 MED ORDER — LEVETIRACETAM 500 MG PO TABS
500.0000 mg | ORAL_TABLET | Freq: Two times a day (BID) | ORAL | Status: DC
Start: 2021-03-11 — End: 2021-10-01

## 2021-03-11 MED ORDER — ACETAMINOPHEN 325 MG PO TABS
650.0000 mg | ORAL_TABLET | Freq: Four times a day (QID) | ORAL | Status: DC | PRN
  Administered 2021-03-11 – 2021-03-12 (×3): 650 mg via ORAL
  Filled 2021-03-11 (×3): qty 2

## 2021-03-11 MED ORDER — HYDROCERIN EX CREA: 1.0000 "application " | TOPICAL_CREAM | Freq: Two times a day (BID) | CUTANEOUS | 0 refills | Status: DC

## 2021-03-11 MED ORDER — PROSOURCE PLUS PO LIQD
30.0000 mL | Freq: Two times a day (BID) | ORAL | Status: DC
Start: 2021-03-11 — End: 2021-03-13
  Administered 2021-03-11 – 2021-03-13 (×4): 30 mL via ORAL
  Filled 2021-03-11 (×4): qty 30

## 2021-03-11 MED ORDER — INSULIN ASPART 100 UNIT/ML IJ SOLN
0.0000 [IU] | Freq: Three times a day (TID) | INTRAMUSCULAR | 11 refills | Status: DC
Start: 2021-03-11 — End: 2021-07-17

## 2021-03-11 MED ORDER — AMLODIPINE BESYLATE 10 MG PO TABS
10.0000 mg | ORAL_TABLET | Freq: Every day | ORAL | Status: DC
  Administered 2021-03-11 – 2021-03-13 (×3): 10 mg via ORAL
  Filled 2021-03-11 (×3): qty 1

## 2021-03-11 MED ORDER — GABAPENTIN 100 MG PO CAPS: 100.0000 mg | ORAL_CAPSULE | Freq: Every day | ORAL | Status: DC

## 2021-03-11 MED ORDER — LEVETIRACETAM 500 MG PO TABS
500.0000 mg | ORAL_TABLET | Freq: Two times a day (BID) | ORAL | Status: DC
  Administered 2021-03-11 – 2021-03-13 (×5): 500 mg via ORAL
  Filled 2021-03-11 (×5): qty 1

## 2021-03-11 MED ORDER — LOPERAMIDE HCL 2 MG PO CAPS
2.0000 mg | ORAL_CAPSULE | ORAL | 0 refills | Status: DC | PRN
  Filled 2021-03-11: qty 30, 30d supply, fill #0

## 2021-03-11 MED ORDER — PANTOPRAZOLE SODIUM 40 MG PO TBEC: 40.0000 mg | DELAYED_RELEASE_TABLET | Freq: Every day | ORAL | Status: DC

## 2021-03-11 MED ORDER — OXYCODONE HCL 5 MG PO TABS
2.5000 mg | ORAL_TABLET | ORAL | Status: DC | PRN
  Administered 2021-03-12 – 2021-03-13 (×5): 2.5 mg via ORAL
  Filled 2021-03-11 (×5): qty 1

## 2021-03-11 MED ORDER — DICLOFENAC SODIUM 1 % EX GEL
4.0000 g | Freq: Four times a day (QID) | CUTANEOUS | Status: DC
Start: 2021-03-11 — End: 2022-02-19

## 2021-03-11 MED ORDER — CLONAZEPAM 0.5 MG PO TABS: 0.5000 mg | ORAL_TABLET | Freq: Two times a day (BID) | ORAL | 0 refills | Status: DC

## 2021-03-11 MED ORDER — GABAPENTIN 100 MG PO CAPS
100.0000 mg | ORAL_CAPSULE | Freq: Every day | ORAL | Status: DC
  Administered 2021-03-11 – 2021-03-12 (×2): 100 mg via ORAL
  Filled 2021-03-11 (×2): qty 1

## 2021-03-11 MED ORDER — APIXABAN 5 MG PO TABS
5.0000 mg | ORAL_TABLET | Freq: Two times a day (BID) | ORAL | Status: DC
  Administered 2021-03-11 – 2021-03-13 (×5): 5 mg via ORAL
  Filled 2021-03-11 (×5): qty 1

## 2021-03-11 MED ORDER — INSULIN ASPART 100 UNIT/ML IJ SOLN
0.0000 [IU] | Freq: Three times a day (TID) | INTRAMUSCULAR | Status: DC
Start: 2021-03-11 — End: 2021-03-13
  Administered 2021-03-13: 2 [IU] via SUBCUTANEOUS

## 2021-03-11 MED ORDER — CLONAZEPAM 0.5 MG PO TABS
0.5000 mg | ORAL_TABLET | Freq: Two times a day (BID) | ORAL | Status: DC
  Administered 2021-03-11 – 2021-03-13 (×5): 0.5 mg via ORAL
  Filled 2021-03-11 (×5): qty 1

## 2021-03-11 MED ORDER — ADULT MULTIVITAMIN W/MINERALS CH
1.0000 | ORAL_TABLET | Freq: Every day | ORAL | Status: DC
  Administered 2021-03-11 – 2021-03-13 (×3): 1 via ORAL
  Filled 2021-03-11 (×3): qty 1

## 2021-03-11 NOTE — Plan of Care (Signed)
  Problem: Health Behavior/Discharge Planning: ?Goal: Ability to manage health-related needs will improve ?Outcome: Progressing ?  ?Problem: Clinical Measurements: ?Goal: Ability to maintain clinical measurements within normal limits will improve ?Outcome: Progressing ?Goal: Will remain free from infection ?Outcome: Progressing ?Goal: Diagnostic test results will improve ?Outcome: Progressing ?Goal: Respiratory complications will improve ?Outcome: Progressing ?Goal: Cardiovascular complication will be avoided ?Outcome: Progressing ?  ?Problem: Activity: ?Goal: Risk for activity intolerance will decrease ?Outcome: Progressing ?  ?Problem: Nutrition: ?Goal: Adequate nutrition will be maintained ?Outcome: Progressing ?  ?Problem: Coping: ?Goal: Level of anxiety will decrease ?Outcome: Progressing ?  ?Problem: Elimination: ?Goal: Will not experience complications related to bowel motility ?Outcome: Progressing ?Goal: Will not experience complications related to urinary retention ?Outcome: Progressing ?  ?Problem: Pain Managment: ?Goal: General experience of comfort will improve ?Outcome: Progressing ?  ?Problem: Safety: ?Goal: Ability to remain free from injury will improve ?Outcome: Progressing ?  ?Problem: Skin Integrity: ?Goal: Risk for impaired skin integrity will decrease ?Outcome: Progressing ?  ?Problem: Safety: ?Goal: Non-violent Restraint(s) ?Outcome: Progressing ?  ?

## 2021-03-11 NOTE — Discharge Summary (Signed)
Physician Discharge Summary  Thomas [name] ATF:[id number] DOB: Nov 05, 1955 DOA: 11/30/2020  PCP: [name], NP  Admit date: 11/30/2020 Discharge date: 03/12/2021  Time spent: 45 minutes  Recommendations for Outpatient Follow-up:  Patient will need to follow-up with [facility].  He needs to call to schedule a follow-up visit within 4 weeks after discharge from skilled nursing facility. Patient will need to follow-up with [facility] after discharge regarding recent renal dysfunction that required short-term dialysis Patient will also need to follow-up with interventional radiology after 03/24/2021 to be evaluated for removal of PEG tube. Patient needs to follow-up with [facility] to discuss follow-up endoscopy after hospitalization Patient needs to follow-up with his primary oncologist Dr. Benay Spice 2 to 4 weeks after discharge from skilled nursing facility He will need to follow-up with Dr. Constance Holster with ENT after discharge from skilled nursing facility for routine hospital follow-up after trach decannulation He will need to follow-up with his primary surgical team/Dr. Bobbye Morton after discharge from skilled nursing facility MRI obtained on 9/30 in context of acute stroke symptoms revealed bilateral punctate lesions.  Radiologist recommended follow-up MRI with and without contrast in 6 to 8 weeks to clarify if lesions were secondary to metastatic disease.  Patient currently asymptomatic and will defer this study to the oncologist since it requires CONTRAST and patient has just recently recovered renal function   Discharge Diagnoses:  Principal Problem:   Diabetes mellitus with hyperosmolarity without hyperglycemic hyperosmolar nonketotic coma ([place]) Active Problems:   Hypertension   Acute renal failure superimposed on stage 2 chronic kidney disease (HCC)   Adverse effect of chemotherapy   Pressure injury of skin   Atrial fibrillation with RVR (HCC)   Sepsis  ([place])   Malnutrition of moderate degree   Acute metabolic encephalopathy   Acute respiratory failure with hypoxemia (HCC)   Acute renal failure with acute cortical necrosis (HCC)   Shock ([place])   Palliative care by specialist   Cerebral embolism with cerebral infarction   Acute esophagitis   Duodenal ulcer disease   Epilepsy ([place])   Hyperlipidemia   Peripheral neuropathy  SEPSIS RESOLVED  Discharge Condition: Stable  Diet recommendation: Carbohydrate modified dysphagia 3 with thin liquids  Filed Weights   03/08/21 0500 03/09/21 0500 03/12/21 0404  Weight: 108.9 kg 108.3 kg 106.9 kg    History of present illness:   65 y.o. male with a history of T2DM with neuropathy, HTN, HLD., colon cancer s/p resection and colostomy May 2022 s/p 3 cycles chemotherapy (last was July 2022). Followed by Dr. Benay Spice. Started CAPOX June 2022, reduced cepecitabine dose starting 11/19/2020 due to hand/foot syndrome, reduced oxaliplatin due to renal impairment and poor performance status. Presented to the ED 7/24 with abdominal pain found to be febrile, tachycardic, tachypneic. Lactic acid was 6.6 and persisted at 6.8 after sepsis IV fluid bolus and cefepime. He had ARF, demand ischemia, bandemia, was hyperglycemic with negative ketones, with lactic acidosis. He developed AFib with RVR in the ED, was started on diltiazem, and was admitted to the ICU for septic shock with unclear source. Sepsis was subsequently felt to be ruled out at time of admission, presentation felt more likely to be related to cepacitabine toxicity with oral mucositis, hand/foot syndrome, and diarrhea/high output colostomy.    He was transferred to hospitalist service 7/27 but developed metabolic encephalopathy with hyoxia due to aspiration pneumonia on 7/30 requiring intubation and pressor support. Failed extubation with inability to clear secretions, requiring reintubation 8/8, and tracheostomy subsequently  placed 8/11. This was pulled out  8/22, replaced under bronch guidance, again removed 8/25 requiring reintubation after failed reinsertion. ENT revised tracheostomy in the OR 8/29. He has required IHD for ARF, and also had complications of RLE DVT now on eliquis, colitis thought to be ischemic 8/2 not requiring specific intervention, Pseudomonas pneumonia having completed treatment, agitated encephalopathy which has improved, anemia requiring 4u PRBCs total, and thrombocytopenia which has resolved. On 9/4 he was transferred to hospitalist service having tolerated trach collar.  Patient was also evaluated by Dr. Benay Spice during this admission clarified that patient was receiving curative chemotherapy prior to admission   Subsequently patient has had issues with bronchial plugging requiring urgent bronchoscopy and transient readmission to the ICU.  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  He has had episodes of aspiration pneumonitis.  He has had recurrent urinary retention despite several attempts to remove Foley therefore Foley remains in place.  He has had 2 episodes of funguria.  As of 10/26 patient has made significant improvements in status and he is able to talk and swallowing is greatly improved as well as tongue mobility.  Trach team initiated capping trials as of 10/28.  He was successfully decannulated on 10/31.  His diet has been advanced to dysphagia 3 with thin liquids  Hospital Course:  Sepsis with hypotension -Suspect aspiration pneumonia in context of postdialysis emesis -Sepsis physiology has resolved -Recurrent funguria-completed Diflucan on 10/30   Volume depletion with hypotension -Resolved -Due to excessive high volume liquid stools -Continue scheduled Imodium daily     Acute hypoxic respiratory failure s/p tracheostomy due to aspiration pneumonia /Recurrent pansensitive Pseudomonas tracheobronchitis with mucous plugging on 10/7 -Status  post bronchoscopy  -successfully decannulated on 10/31 -stable on room air   Symptomatic anemia/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 -He has received a total of 5 units PRBCs since admission with current hemoglobin stable at 8.7   Acute renal failure on stage IIIb CKD/Presumed ischemic ATN -Renal function stable and patient has maintain adequate urinary output therefore no longer requiring hemodialysis -TDC catheter removed on 10/31 in IR -As of 11/4 BUN 41 and creatinine 2.62 -Will not resume preadmission metformin   Acute delirium -Resolved  -2/2 benzodiazepine withdrawal -Continue Klonopin 0.5 mg BID after discharge   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/known hand-and-foot syndrome -Continue Neurontin at bedtime noting dosage decreased from preadmission dose of 300 mg to 100 mg  -Allow for low-dose prn Oxy IR after discharge   Grade LA C reflux esophagitis/nonbleeding duodenal ulcers -PPI for total of 4-weeks (initiated 9/29-and discontinued 11/2) -Follow-up EGD recommended after discharge   Inability to elevate tongue  -Resolved   Urinary retention -Foley catheter removed on 10/31 without recurrence of urinary retention   Dysphagia/protein calorie malnutrition -PEG placed 10/4 -10/31 SLP advanced to D3 diet and has tolerated well so as of 11/2 tube feedings discontinued -PEG tube was placed on 10/4 therefore need to wait at least 6 weeks before removing tube   MRI with bilateral punctate lesion ([place]) -Stroke team differential included thrombotic etiology from either A. fib or from DVT if has PFO-continue Eliquis -9/30 Recommendation is follow-up MRI in 6 to 8 weeks with and without contrast since not clear as to whether this could also be metastatic in etiology-defer this to outpatient oncology especially in context of patient with recently recovered renal function.   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 -CBGs well controlled with SSI alone.   -Given recent acute kidney injury requiring dialysis with recent recovery of renal function will not resume preadmission metformin -Gabapentin resumed but at a lower dose 10 was prescribed prior to admission   HTN:  -11/2 preadmission Norvasc resumed with significant improvement in blood pressure.  On date of discharge BP was 127/80 with a pulse of 89   HLD:  -Continue atorvastatin           Pressure Injury 02/05/21 Coccyx Lower Stage 2 -  Partial thickness loss of dermis presenting as a shallow open injury with a red, pink wound bed without slough. healing quarter sized spot on sacrum (Active)  Date First Assessed/Time First Assessed: 02/05/21 0800   Location: Coccyx  Location Orientation: Lower  Staging: Stage 2 -  Partial thickness loss of dermis presenting as a shallow open injury with a red, pink wound bed without slough.  Wound Descript...     Assessments 02/05/2021  5:10 PM 03/08/2021  9:13 AM  Dressing Type Foam - Lift dressing to assess site every shift Foam - Lift dressing to assess site every shift  Dressing Changed Clean;Dry;Intact  Dressing Change Frequency -- Every 3 days  State of Healing  Early/partial granulation --  Site / Wound Assessment Pink Clean;Dry  % Wound base Red or Granulating 100% --  Peri-wound Assessment -- Intact  Wound Length (cm) 2 cm --  Wound Width (cm) 2 cm --  Wound Surface Area (cm^2) 4 cm^2 --  Margins Attached edges (approximated) --  Drainage Amount None None  Treatment Cleansed --    Procedures: 2u PRBC 7/31, 1u PRBC 8/17, 1u PRBC 8/27 Tracheostomy revision 01/05/2021 Dr. Constance Holster [facility] 8/22 10/7 briefly changed to cuffed trach to allow for short-term mechanical ventilation and subsequently changed back to cuffless trach on 10/8 10/4 PEG tube placement in IR 10/31 decannulation  Consultations: PCCM General surgery Nephrology Hematology/oncology Palliative care medicine team Gastroenterology Neurology ENT  Antibiotics: Vancomycin 7/24 Cefepime 7/24 - 7/27 Unasyn 7/30 >> cefepime/flagyl 7/31 - 8/6  Cefazolin x1 dose on 10/ Zosyn 10/5 through 10/9 Vancomycin 10/5 through 10/6 10/19 Zosyn >>10/26 Diflucan 10/20 through 10/26 to p.o. on 10/27  Discharge Exam: Vitals:   03/12/21 0404 03/12/21 0953  BP: 133/83 (!) 152/83  Pulse: 95   Resp:  16  Temp: 98.2 F (36.8 C) 98.4 F (36.9 C)  SpO2: 98% 100%   Constitutional: Alert, no acute distress Respiratory: RA, Lung sounds CTA, O2 99%, no increased work of breathing  Cardiac: S1-S2, regular pulse without tachycardia, no peripheral edema, skin warm and dry, normotensive Abdomen: Abdomen soft, nontender, PEG tube Normoactive bowel sounds.  Colostomy with light brown liquid stool  Neurologic: CN 2-12 grossly intact.  Strength of both upper and lower extremities estimated 3-4/5.   Psychiatric: Awakens, oriented x3 at  present.  Pleasant affect.  Discharge Instructions   Discharge Instructions     Ambulatory referral to ENT   Complete by: As directed    Patient will discharge to [facility] for additional rehabilitation before returning home with wife   Ambulatory  referral to Gastroenterology   Complete by: As directed    Patient needs to follow-up after hospital endoscopy.  Recommendation is to repeat endoscopy 6 to 8 weeks after initial endoscopy completed during the hospitalization.  Patient will discharge to [facility] for further physical rehabilitation before returning home with his wife   Ambulatory referral to General Surgery   Complete by: As directed    Patient will discharge to [facility] for additional physical rehabilitation before returning home with wife   Ambulatory referral to Hematology / Oncology   Complete by: As directed    Patient will need to follow-up with his primary oncologist Dr. Benay Spice.  He will be initially discharged to [facility] for further rehab and then expected to return home with wife   Ambulatory referral to Interventional Radiology   Complete by: As directed    Patient will need to follow-up with interventional radiology after 11/15 to discuss removal of PEG tube.  Discharged to [facility] for further physical rehabilitation before returning home with wife   Ambulatory referral to Nephrology   Complete by: As directed    Hospital follow-up.  Patient required short-term dialysis during hospitalization.  Patient will discharge to [facility] for short-term rehab   Ambulatory referral to Neurology   Complete by: As directed    An appointment is requested in approximately: 8 weeks-patient will discharge to [facility] for further rehabilitation before returning home with wife      Allergies as of 03/12/2021       Reactions   Morphine And Related Other (See Comments)   Throat swelling (can tolerate oxycodone/hydrocodone/hydromorphone)        Medication List     STOP taking these medications    capecitabine 500 MG tablet Commonly known as: XELODA   lidocaine-prilocaine cream Commonly known as: EMLA   metFORMIN 500 MG 24 hr  tablet Commonly known as: GLUCOPHAGE-XR   prochlorperazine 10 MG tablet Commonly known as: COMPAZINE       TAKE these medications    acetaminophen 325 MG tablet Commonly known as: TYLENOL Take 2 tablets (650 mg total) by mouth every 6 (six) hours as needed for mild pain or fever. What changed:  medication strength how much to take when to take this reasons to take this   albuterol 108 (90 Base) MCG/ACT inhaler Commonly known as: VENTOLIN HFA Inhale 2 puffs into the lungs every 4 (four) hours as needed for wheezing or shortness of breath.   amLODipine 10 MG tablet Commonly known as: NORVASC Take 10 mg by mouth daily.   atorvastatin 20 MG tablet Commonly known as: LIPITOR Take 20 mg by mouth daily at 6 PM.   clonazePAM 0.5 MG tablet Commonly known as: KLONOPIN Take 1 tablet (0.5 mg total) by mouth 2 (two) times daily.   diclofenac Sodium 1 % Gel Commonly known as: VOLTAREN Apply 4 g topically 4 (four) times daily.   Eliquis 5 MG Tabs tablet Generic drug: apixaban Take 1 tablet (5 mg total) by mouth 2 (two) times daily.   feeding supplement Liqd Take 237 mLs by mouth 3 (three) times daily between meals.   (feeding supplement) PROSource Plus liquid  Take 30 mLs by mouth 2 (two) times daily between meals.   gabapentin 100 MG capsule Commonly known as: NEURONTIN Take 1 capsule (100 mg total) by mouth at bedtime. What changed:  medication strength how much to take when to take this   hydrocerin Crea Apply 1 application topically 2 (two) times daily.   insulin aspart 100 UNIT/ML injection Commonly known as: novoLOG Inject 0-15 Units into the skin 3 (three) times daily with meals. Do NOT hold insulin if patient is NPO. Moderate Scale.   levETIRAcetam 500 MG tablet Commonly known as: KEPPRA Take 1 tablet (500 mg total) by mouth 2 (two) times daily. What changed:  medication strength how much to take   loperamide 2 MG capsule Commonly known as:  IMODIUM Take 1 capsule (2 mg total) by mouth as needed for diarrhea or loose stools.   multivitamin with minerals Tabs tablet Take 1 tablet by mouth daily. Start taking on: March 13, 2021   omeprazole 20 MG capsule Commonly known as: PRILOSEC Take 20 mg by mouth daily.   ondansetron 8 MG tablet Commonly known as: ZOFRAN Take 1 tablet (8 mg total) by mouth every 8 (eight) hours as needed for nausea or vomiting.   oxyCODONE 5 MG immediate release tablet Commonly known as: Oxy IR/ROXICODONE Take 0.5 tablets (2.5 mg total) by mouth every 4 (four) hours as needed for severe pain.        Allergies  Allergen Reactions   Morphine And Related Other (See Comments)    Throat swelling (can tolerate oxycodone/hydrocodone/hydromorphone)    Follow-up Information     [facility]. Schedule an appointment as soon as possible for a visit in 1 month(s).   Specialty: Neurology Why: stroke clinic Contact information: [place], [place], [place]. Call.   Why: To be seen within 2 weeks after discharge from skilled nursing facility. Contact information: [place] [address] [phone]         [place], [place]. Call.   Why: Patient/family will need to call to schedule routine post hospital follow-up after he is discharged from [facility] Contact information: [place] [address] [phone]         Irene Shipper, MD. Call.   Specialty: Gastroenterology Why: She will need to follow-up with Dr. Henrene Pastor with GI to discuss follow-up EGD after recent hospitalization Contact information: [address] [place] [address] [phone]         Ladell Pier, MD. Call.   Specialty: Oncology Why: Patient will need to call to schedule follow-up appointment with Dr Benay Spice after discharge from skilled nursing facility Contact information: [place] [address] [phone]         [name], MD. Call.   Specialty: Otolaryngology Why: Patient will need to follow-up with Dr. Constance Holster for routine post hospital care after trach decannulation Contact information: [address] [place] [address] [phone]         [name], MD. Call.   Specialty: Surgery Why: He will need to follow-up with Dr. Bobbye Morton regarding recent colectomy and post hospital follow-up Contact information: [place] [address] [phone]         [facility] Follow up.   Why: Is to schedule follow-up appointment after 03/24/2021 to discuss removal of PEG tube Contact information: [address]  [place]                 The results of significant diagnostics from this hospitalization (including imaging, microbiology, ancillary and laboratory) are listed below for reference.    Significant Diagnostic Studies: CT ABDOMEN PELVIS WO CONTRAST  Result Date: 02/25/2021 CLINICAL DATA:  Emesis and aspiration. Retroperitoneal hematoma suspected. EXAM: CT ABDOMEN AND PELVIS WITHOUT CONTRAST TECHNIQUE: Multidetector CT imaging of the abdomen and pelvis was performed following the standard protocol without IV contrast. COMPARISON:  Radiography 3 days ago.  CT 01/30/2021. FINDINGS: Lower chest: Worsening of patchy infiltrates at both lung bases consistent with pneumonia. Hepatobiliary: Liver parenchyma appears normal without contrast. No calcified gallstones. Pancreas: Normal Spleen: Normal Adrenals/Urinary Tract: Adrenal glands are normal. Kidneys are normal except for a 4 cm cyst on the left as seen previously. Foley catheter in the bladder. Stomach/Bowel: Stomach and small intestine are normal except for the presence of a gastrostomy tube. There is been colectomy with right upper quadrant ostomy the does not show complicating features. Distal colon  appears unremarkable. Vascular/Lymphatic: Aortic atherosclerosis. No aneurysm. IVC is normal. No retroperitoneal adenopathy. Reproductive: Normal Other: Bilateral inguinal hernias containing fat, left more than right. Musculoskeletal: Chronic degenerative changes of the lumbar spine with apparent multilevel spinal stenosis. IMPRESSION: Worsening bilateral lower lobe pulmonary infiltrates. No acute abdominal or pelvic region finding. No evidence of retroperitoneal hemorrhage. Right upper quadrant ostomy and gastrostomy without complicating features. Aortic Atherosclerosis (ICD10-I70.0). Electronically Signed   By: Nelson Chimes M.D.   On: 02/25/2021 11:35   DG Chest 1 View  Result Date: 02/12/2021 CLINICAL DATA:  Chest pain EXAM: PORTABLE CHEST 1 VIEW COMPARISON:  02/12/2021 FINDINGS: Tracheostomy tube, dialysis catheter and right-sided chest wall port are again seen and stable. Cardiac shadow is within normal limits. Lungs are clear bilaterally. No bony abnormality is noted. IMPRESSION: No active disease. Electronically Signed   By: Inez Catalina M.D.   On: 02/12/2021 19:33   DG Abd 1 View  Result Date: 02/22/2021 CLINICAL DATA:  Abdominal pain EXAM: ABDOMEN - 1 VIEW COMPARISON:  02/06/2021 FINDINGS: Weighted feeding catheter has been removed in the interval. Gastrostomy catheter is noted over the stomach. Scattered large and small bowel gas is seen. No obstructive changes are noted. IMPRESSION: Gastrostomy catheter within the stomach. No other focal abnormality is seen. Electronically Signed   By: Inez Catalina M.D.   On: 02/22/2021 00:30   CT HEAD WO CONTRAST (5MM)  Result Date: 02/25/2021 CLINICAL DATA:  Mental status change. EXAM: CT HEAD WITHOUT CONTRAST TECHNIQUE: Contiguous axial images were obtained from the base of the skull through the vertex without intravenous contrast. COMPARISON:  12/05/2020 FINDINGS: Brain: Stable cerebral atrophy with chronic encephalomalacia in the left frontal lobe. No  evidence for acute hemorrhage, mass lesion, midline shift, hydrocephalus or large new infarct. Vascular: No hyperdense vessel or unexpected calcification. Skull: Prior left craniotomy. Sinuses/Orbits: New fluid in the mastoid air cells bilaterally. Small amount of fluid or mucosal thickening in the left sphenoid sinus. Mild mucosal disease in the bilateral maxillary sinuses. Other: None IMPRESSION: 1. No acute intracranial abnormality. 2. Fluid in the bilateral mastoid air cells. Mild paranasal sinus disease. 3. Stable encephalomalacia in the left frontal lobe and previous left craniotomy. Electronically Signed   By: Markus Daft M.D.   On: 02/25/2021 11:35   CT CHEST WO CONTRAST  Result Date: 02/11/2021 CLINICAL DATA:  Pneumonia.  Hypoxia. EXAM: CT CHEST WITHOUT CONTRAST TECHNIQUE: Multidetector CT imaging  of the chest was performed following the standard protocol without IV contrast. COMPARISON:  December 01, 2020. FINDINGS: Cardiovascular: Atherosclerosis of thoracic aorta is noted without aneurysm formation. Normal cardiac size. No pericardial effusion. Coronary artery calcifications are noted. Mediastinum/Nodes: Tracheostomy tube is in grossly good position. The esophagus is unremarkable. No adenopathy is noted. Thyroid gland is unremarkable. Lungs/Pleura: No pneumothorax is noted. No pleural effusion is noted. Left lower lobe airspace opacity is noted with air bronchograms concerning for pneumonia. Abnormal soft tissue density is noted in the left upper and lower lobe bronchi concerning for aspiration or less likely mucous plugging. Minimal right posterior basilar subsegmental atelectasis is noted. Upper Abdomen: Gastrostomy tube is in grossly good position. Musculoskeletal: No chest wall mass or suspicious bone lesions identified. IMPRESSION: Left lower lobe airspace opacity is noted with air bronchograms consistent with pneumonia. Abnormal soft tissue density is seen occluding the left upper and lower lobe  bronchi most consistent with aspiration or less likely mucous plugging. Coronary artery calcifications are noted. Aortic Atherosclerosis (ICD10-I70.0). Electronically Signed   By: Marijo Conception M.D.   On: 02/11/2021 17:14   IR GASTROSTOMY TUBE MOD SED  Result Date: 02/10/2021 CLINICAL DATA:  Oropharyngeal dysphagia, needs enteral feeding support EXAM: PERC PLACEMENT GASTROSTOMY FLUOROSCOPY TIME:  3 minute 7 seconds; 44 mGy TECHNIQUE: The procedure, risks, benefits, and alternatives were explained to the patient. Questions regarding the procedure were encouraged and answered. The patient understands and consents to the procedure. As antibiotic prophylaxis, cefazolin 2 g was ordered pre-procedure and administered intravenously within one hour of incision. Appropriate percutaneous approach was confirmed on recent CT. A 5 French angiographic catheter was placed as orogastric tube. The upper abdomen was prepped with Betadine, draped in usual sterile fashion, and infiltrated locally with 1% lidocaine. Intravenous Fentanyl 133mg and Versed 340mwere administered as conscious sedation during continuous monitoring of the patient's level of consciousness and physiological / cardiorespiratory status by the radiology RN, with a total moderate sedation time of 13 minutes. 0.5 mg glucagon given IV. Stomach was insufflated using air through the orogastric tube. An 1842rench sheath needle was advanced percutaneously into the gastric lumen under fluoroscopy. Gas could be aspirated and a small contrast injection confirmed intraluminal spread. The sheath was exchanged over a guidewire for a 9 Fr[place]ascular sheath, through which the snare device was advanced and used to snare a guidewire passed through the orogastric tube. This was withdrawn, and the snare attached to the 20 French pull-through gastrostomy tube, which was advanced antegrade, positioned with the internal bumper securing the anterior gastric wall to the anterior  abdominal wall. Small contrast injection confirms appropriate positioning. The external bumper was applied and the catheter was flushed. COMPLICATIONS: COMPLICATIONS none IMPRESSION: 1. Technically successful 20 French pull-through gastrostomy placement under fluoroscopy. Electronically Signed   By: D Lucrezia Europe.D.   On: 02/10/2021 14:41   IR Removal Tun Cv Cath W/O FL  Result Date: 03/09/2021 INDICATION: Patient admitted with sepsis and respiratory failure required in-patient hemodialysis and interventional radiology placed a tunneled dialysis catheter December 22, 2020. Patient has experienced renal recovery and no longer requires hemodialysis. Interventional radiology asked to remove tunneled dialysis catheter. EXAM: REMOVAL TUNNELED CENTRAL VENOUS CATHETER MEDICATIONS: 1% lidocaine 10 mL ANESTHESIA/SEDATION: None FLUOROSCOPY TIME:  None COMPLICATIONS: None immediate. PROCEDURE: Informed written consent was obtained from the patient after a thorough discussion of the procedural risks, benefits and alternatives. All questions were addressed. Maximal Sterile Barrier Technique was utilized including caps, mask, sterile  gowns, sterile gloves, sterile drape, hand hygiene and skin antiseptic. A timeout was performed prior to the initiation of the procedure. The patient's left chest and catheter were prepped and draped in a normal sterile fashion. Heparin was removed from both ports of catheter. 1% lidocaine was used for local anesthesia. Using gentle blunt dissection and moderate manual traction the cuff of the catheter was exposed and the catheter was removed in it's entirety. Pressure was held till hemostasis was obtained. A sterile dressing was applied. The patient tolerated the procedure well with no immediate complications. IMPRESSION: Successful catheter removal as described above. Read by: Soyla Dryer, NP Electronically Signed   By: Jacqulynn Cadet M.D.   On: 03/09/2021 12:27   DG CHEST PORT 1  VIEW  Result Date: 02/26/2021 CLINICAL DATA:  Tachypnea, pneumonia of both lower lobes EXAM: PORTABLE CHEST - 1 VIEW COMPARISON:  02/24/2021 FINDINGS: Stable tunneled left IJ hemodialysis catheter, right IJ power port, and tracheostomy device. Some improvement of the patchy airspace opacity seen in the right upper lobe. Left lung clear. Heart size and mediastinal contours are within normal limits. No effusion. Visualized bones unremarkable. IMPRESSION: Improving right upper lobe patchy airspace disease Electronically Signed   By: Lucrezia Europe M.D.   On: 02/26/2021 08:18   DG CHEST PORT 1 VIEW  Result Date: 02/24/2021 CLINICAL DATA:  Short of breath EXAM: PORTABLE CHEST 1 VIEW COMPARISON:  02/23/2021 FINDINGS: Tracheostomy tube and central venous lines are unchanged. There is patchy airspace disease in the RIGHT upper lobe. Lung bases relatively clear. No focal pulmonary edema. IMPRESSION: Airspace disease in the RIGHT upper lobe suggest pneumonia. Electronically Signed   By: Suzy Bouchard M.D.   On: 02/24/2021 20:12   DG CHEST PORT 1 VIEW  Result Date: 02/23/2021 CLINICAL DATA:  Leukocytosis, aspiration pneumonia EXAM: PORTABLE CHEST 1 VIEW COMPARISON:  Chest radiograph 02/14/2021 FINDINGS: The endotracheal tube tip is approximately 2.8 cm from the carina. The right chest wall port and tunneled left-sided central venous catheter are in stable position with the tips at the cavoatrial junction. The cardiomediastinal silhouette is stable. Lung volumes are low. There is no focal consolidation or pulmonary edema. There is no pleural effusion or pneumothorax. There is no acute osseous abnormality. A gastrostomy tube is seen over the left upper quadrant. IMPRESSION: 1. Support devices as above in satisfactory position. 2. Low lung volumes. Otherwise, no radiographic evidence of acute cardiopulmonary process. Electronically Signed   By: Valetta Mole M.D.   On: 02/23/2021 10:48   DG CHEST PORT 1  VIEW  Result Date: 02/14/2021 CLINICAL DATA:  65 year old male with history of tracheostomy. EXAM: PORTABLE CHEST 1 VIEW COMPARISON:  Chest x-ray 02/12/2021. FINDINGS: A tracheostomy tube is in place with tip 3.6 cm above the carina. Right internal jugular Port-A-Cath with tip terminating in the superior aspect of the right atrium. Left internal jugular PermCath with tip terminating at the superior cavoatrial junction. Lung volumes are low. No consolidative airspace disease. No pleural effusions. No pneumothorax. No pulmonary nodule or mass noted. Pulmonary vasculature and the cardiomediastinal silhouette are within normal limits. Atherosclerotic calcifications in the thoracic aorta. IMPRESSION: 1. Support apparatus, as above. 2. Low lung volumes without radiographic evidence of acute cardiopulmonary disease. 3. Aortic atherosclerosis. Electronically Signed   By: Vinnie Langton M.D.   On: 02/14/2021 05:14   DG Chest Port 1 View  Result Date: 02/12/2021 CLINICAL DATA:  Pneumonia EXAM: PORTABLE CHEST 1 VIEW COMPARISON:  Chest x-ray dated February 11, 2021 FINDINGS:  Visualized cardiac and mediastinal contours are unchanged. Unchanged tracheostomy tube, right chest wall port and left dialysis catheter. Near complete resolution of left lung heterogeneous opacities. No large pleural effusion or evidence of pneumothorax. IMPRESSION: Near complete resolution of left lung heterogeneous opacities, favor improved atelectasis due to increased lung volumes. Electronically Signed   By: Yetta Glassman M.D.   On: 02/12/2021 08:34   DG CHEST PORT 1 VIEW  Result Date: 02/11/2021 CLINICAL DATA:  Hypoxia. EXAM: PORTABLE CHEST 1 VIEW COMPARISON:  January 29, 2021. FINDINGS: The heart size and mediastinal contours are within normal limits. Tracheostomy tube is unchanged in position. Left internal jugular dialysis catheter is in good position. Feeding tube has been removed. Right internal jugular Port-A-Cath is again noted.  Right lung is clear. Increased diffuse left lung opacity is noted concerning for worsening pneumonia or atelectasis with associated left pleural effusion. The visualized skeletal structures are unremarkable. IMPRESSION: Increased diffuse left lung opacity is noted concerning for worsening pneumonia or atelectasis with possible small left pleural effusion. Electronically Signed   By: Marijo Conception M.D.   On: 02/11/2021 12:55   DG Swallowing Func-Speech Pathology  Result Date: 03/02/2021 Table formatting from the original result was not included. 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  Dizziness   Dyspnea   pt states d/t weight  Epilepsy ([place])   takes Keppra daily  GERD (gastroesophageal reflux disease)   takes Omeprazole daily  Headache   Hyperlipidemia   takes Atorvastatin daily  Hypertension   takes Lotrel daily  Peripheral edema   takes Lasix daily  Peripheral neuropathy   takes Gabapentin daily  Pneumonia 3 yrs ago  hx of  Seizures ([place])   Sleep apnea   Urinary frequency  Past Surgical History: Past Surgical History: Procedure Laterality Date  BIOPSY  09/05/2020  Procedure: BIOPSY;  Surgeon: Irene Shipper, MD;  Location: [facility] ENDOSCOPY;  Service: Endoscopy;;  BIOPSY  02/06/2021  Procedure: BIOPSY;  Surgeon: Jerene Bears, MD;  Location: [facility] ENDOSCOPY;  Service: Gastroenterology;;  CARDIAC  CATHETERIZATION  7 yrs ago  COLONOSCOPY WITH PROPOFOL N/A 09/05/2020  Procedure: COLONOSCOPY WITH PROPOFOL;  Surgeon: Irene Shipper, MD;  Location: [place];  Service: Endoscopy;  Laterality: N/A;  CRANIOTOMY N/A 09/01/2016  Procedure: CRANIOTOMY TUMOR  LEFT PTERIONAL;  Surgeon: Ashok Pall, MD;  Location: [place];  Service: Neurosurgery;  Laterality: N/A;  cyst removed from chest     as a child  ESOPHAGOGASTRODUODENOSCOPY (EGD) WITH PROPOFOL N/A 02/06/2021  Procedure: ESOPHAGOGASTRODUODENOSCOPY (EGD) WITH PROPOFOL;  Surgeon: Jerene Bears, MD;  Location: [facility] ENDOSCOPY;  Service: Gastroenterology;  Laterality: N/A;  IR FLUORO GUIDE CV LINE LEFT  12/22/2020  IR GASTROSTOMY TUBE MOD SED  02/10/2021  IR US GUIDE VASC ACCESS LEFT  12/22/2020  NO PAST SURGERIES    PARTIAL COLECTOMY N/A 09/10/2020  Procedure: TRANSVERSE COLECTOMY;  Surgeon: [name],  MD;  Location: [place];  Service: General;  Laterality: N/A;  POLYPECTOMY  09/05/2020  Procedure: POLYPECTOMY;  Surgeon: Irene Shipper, MD;  Location: [place];  Service: Endoscopy;;  PORTACATH PLACEMENT Right 10/01/2020  Procedure: INSERTION PORT-A-CATH;  Surgeon: [name], MD;  Location: [place];  Service: General;  Laterality: Right;  SUBMUCOSAL TATTOO INJECTION  09/05/2020  Procedure: SUBMUCOSAL TATTOO INJECTION;  Surgeon: Irene Shipper, MD;  Location: [facility] ENDOSCOPY;  Service: Endoscopy;;  TRACHEOSTOMY TUBE PLACEMENT N/A 01/05/2021  Procedure: TRACHEOSTOMY;  Surgeon: [name], MD;  Location: [place];  Service: ENT;  Laterality: N/A; HPI: Pt is a 65 y/o male admitted on 7/25 for worsening sepsis vs reaction to chemotherapy with high ostomy output.  Pt developed worsening hypoxia and was requiring BIPAP w/ FIO2 of 100% and increased work of breathing. SLP consulted due to concern for aspiration. BSE 7/28 recommended NPO status and MBS, but this could not completed subsequently due to lethargy. CXR in AM of 7/30 showed some worsening concern of aspiration. ETT  7/30-8/8; tolerated nasal cannula for about an hour; reintubated 8/8-trach 8/11. Pt pulled out trach x2 8/22; replaced by PCCM. Bronch on 8/22 revealed endotracheal granulation tissue at trach site. Trach removed again on 8/25; unable to reinsert despite multiple attenpts; pt reintubated 8/25- trach by ENT on 8/29. BSE 8/22 limited to ice chips due to pt's refusal of other consistencies; rx NPO status with Cortrak.MRI brain 9/30: Multiple punctate foci of restricted diffusion in bilateral  frontal white matter, most likely acute infarcts. EGD 9/30:grade C reflux esophagitis/non-bleeding duodenal ulcers.Pt s/p PEG 10/4.  Trach changed to cuffed and pt transferred to ICU on 10/5 due to respiratory distress with mucous plug; bronch compled; vent 10/5-10/6. Bronch 10/5: Significant mucous plugging of the LUL with BAL and suctioning up until the subsegmental region. Trach replaced to #[id number] uncuffed. 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  Subjective: alert, cortrak removed Assessment / Plan / Recommendation CHL IP CLINICAL IMPRESSIONS 03/02/2021 Clinical Impression Pt presents with a primarily oral dysphagia with ongoing decreased tongue tip elevation with pt struggling to gather solid and liquid bolus and initaite posterior propulsion. Lingual body otherwise making contact with hard and soft palate and participating in lingual and base of tongue stripping without difficulty. Given impairment there is is piecemeal swallowing, prolonged oral phase and residue on the floor of the mouth. There are occasional instances of slight premature spillage with thin liquids, though pt tries to prevent this with brief oral hold. In the worst cases, pt has trace silent frank  penetration before the swallow, though penetrate ejects and clears with subsequent swallows. Recommend puree/thin liquids with PMSV in place. Will f/u for further therapy with solids. SLP Visit Diagnosis Dysphagia, oropharyngeal phase (R13.12) Attention and concentration deficit following -- Frontal lobe and executive function deficit following -- Impact on safety and function Mild aspiration risk   CHL IP TREATMENT RECOMMENDATION 03/02/2021 Treatment Recommendations Therapy as outlined in treatment plan below   Prognosis 02/09/2021 Prognosis for Safe Diet Advancement Fair Barriers to Reach Goals Time post onset;Severity of deficits Barriers/Prognosis Comment -- CHL IP DIET RECOMMENDATION 03/02/2021 SLP Diet Recommendations Dysphagia 1 (Puree) solids;Thin liquid Liquid Administration via Cup;Straw Medication Administration Crushed with puree Compensations Slow rate;Small sips/bites Postural Changes Remain semi-upright after after  feeds/meals (Comment);Seated upright at 90 degrees   CHL IP OTHER RECOMMENDATIONS 02/09/2021 Recommended Consults -- Oral Care Recommendations Oral care QID Other Recommendations --   CHL IP FOLLOW UP RECOMMENDATIONS 03/02/2021 Follow up Recommendations Skilled Nursing facility   [facility] IP FREQUENCY AND DURATION 03/02/2021 Speech Therapy Frequency (ACUTE ONLY) min 2x/week Treatment Duration 2 weeks      CHL IP ORAL PHASE 03/02/2021 Oral Phase Impaired Oral - Pudding Teaspoon -- Oral - Pudding Cup -- Oral - Honey Teaspoon -- Oral - Honey Cup -- Oral - Nectar Teaspoon NT Oral - Nectar Cup -- Oral - Nectar Straw -- Oral - Thin Teaspoon NT Oral - Thin Cup Decreased bolus cohesion;Lingual/palatal residue;Pocketing in anterior sulcus;Reduced posterior propulsion;Incomplete tongue to palate contact;Piecemeal swallowing;Right pocketing in lateral sulci;Left pocketing in lateral sulci Oral - Thin Straw Decreased bolus cohesion;Lingual/palatal residue;Pocketing in anterior sulcus;Reduced posterior  propulsion;Incomplete tongue to palate contact;Piecemeal swallowing;Right pocketing in lateral sulci;Left pocketing in lateral sulci Oral - Puree Decreased bolus cohesion;Lingual/palatal residue;Pocketing in anterior sulcus;Reduced posterior propulsion;Incomplete tongue to palate contact;Piecemeal swallowing;Right pocketing in lateral sulci;Left pocketing in lateral sulci Oral - Mech Soft Decreased bolus cohesion;Lingual/palatal residue;Pocketing in anterior sulcus;Reduced posterior propulsion;Incomplete tongue to palate contact;Piecemeal swallowing;Right pocketing in lateral sulci;Left pocketing in lateral sulci Oral - Regular NT Oral - Multi-Consistency -- Oral - Pill Decreased bolus cohesion;Lingual/palatal residue;Pocketing in anterior sulcus;Reduced posterior propulsion;Incomplete tongue to palate contact;Piecemeal swallowing;Right pocketing in lateral sulci;Left pocketing in lateral sulci Oral Phase - Comment --  CHL IP PHARYNGEAL PHASE 03/02/2021 Pharyngeal Phase Impaired Pharyngeal- Pudding Teaspoon -- Pharyngeal -- Pharyngeal- Pudding Cup -- Pharyngeal -- Pharyngeal- Honey Teaspoon -- Pharyngeal -- Pharyngeal- Honey Cup -- Pharyngeal -- Pharyngeal- Nectar Teaspoon NT Pharyngeal -- Pharyngeal- Nectar Cup -- Pharyngeal -- Pharyngeal- Nectar Straw -- Pharyngeal -- Pharyngeal- Thin Teaspoon NT Pharyngeal -- Pharyngeal- Thin Cup Penetration/Aspiration before swallow Pharyngeal Material enters airway, CONTACTS cords and then ejected out Pharyngeal- Thin Straw Penetration/Aspiration before swallow;Trace aspiration Pharyngeal Material enters airway, CONTACTS cords and then ejected out;Material enters airway, passes BELOW cords then ejected out;Material does not enter airway Pharyngeal- Puree WFL Pharyngeal -- Pharyngeal- Mechanical Soft -- Pharyngeal -- Pharyngeal- Regular Delayed swallow initiation-pyriform sinuses Pharyngeal -- Pharyngeal- Multi-consistency -- Pharyngeal -- Pharyngeal- Pill -- Pharyngeal --  Pharyngeal Comment --  CHL IP CERVICAL ESOPHAGEAL PHASE 01/19/2021 Cervical Esophageal Phase (No Data) Pudding Teaspoon -- Pudding Cup -- Honey Teaspoon -- Honey Cup -- Nectar Teaspoon -- Nectar Cup -- Nectar Straw -- Thin Teaspoon -- Thin Cup -- Thin Straw -- Puree -- Mechanical Soft -- Regular -- Multi-consistency -- Pill -- Cervical Esophageal Comment -- [place], Katherene Ponto 03/02/2021, 2:10 PM               Microbiology: No results found for this or any previous visit (from the past 240 hour(s)).   Labs: Basic Metabolic Panel: Recent Labs  Lab 03/08/21 0500 03/09/21 0455 03/10/21 1050 03/11/21 0753 03/12/21 0503  NA 133* 134* 136 134* 138  K 3.4* 4.2 3.5 4.2 4.3  CL 98 102 109 101 106  CO2 _0 GLUCOSE 130* 99 124* 122* 102*  BUN 33* 34* 32* 37* 39*  CREATININE 2.95* 2.95* 2.47* 2.70* 2.75*  CALCIUM 9.0 9.2 7.7* 9.4 9.4  PHOS 2.8 3.1 2.6 3.5 4.1   Liver Function Tests: Recent Labs  Lab 03/08/21 0500 03/09/21 0455 03/10/21 1050 03/11/21 0753 03/12/21 0503  ALBUMIN 2.3* 2.4* 2.0* 2.6* 2.5*   No results for input(s): LIPASE, AMYLASE in the last  168 hours. No results for input(s): AMMONIA in the last 168 hours. CBC: Recent Labs  Lab 03/07/21 0545 03/11/21 1010  WBC 10.6* 10.8*  HGB 8.4* 8.7*  HCT 24.1* 25.8*  MCV 80.9 81.6  PLT 340 362   Cardiac Enzymes: No results for input(s): CKTOTAL, CKMB, CKMBINDEX, TROPONINI in the last 168 hours. BNP: BNP (last 3 results) Recent Labs    12/06/20 2119  BNP 74.9    ProBNP (last 3 results) No results for input(s): PROBNP in the last 8760 hours.  CBG: Recent Labs  Lab 03/11/21 0344 03/11/21 0729 03/11/21 1202 03/11/21 1547 03/12/21 0810  GLUCAP 132* 112* 90 113* 102*       Signed:  [name] ANP [facility] 03/12/2021, 10:49 AM

## 2021-03-11 NOTE — Progress Notes (Signed)
Nutrition Follow-up  DOCUMENTATION CODES:   Non-severe (moderate) malnutrition in context of chronic illness, Obesity unspecified  INTERVENTION:  -Ensure Enlive po TID, each supplement provides 350 kcal and 20 grams of protein -PROSource PLUS PO 66mls BID, each supplement provides 100 kcals and 15 grams of protein -MVI with minerals daily -provided education on improving nutrition status at discharge  NUTRITION DIAGNOSIS:   Moderate Malnutrition related to chronic illness, cancer and cancer related treatments as evidenced by mild fat depletion, mild muscle depletion, percent weight loss. ongoing  GOAL:   Patient will meet greater than or equal to 90% of their needs progressing  MONITOR:   Diet advancement, Labs, Weight trends, TF tolerance, I & O's, Skin  REASON FOR ASSESSMENT:   Consult Enteral/tube feeding initiation and management  ASSESSMENT:   65 y.o. male who is a former with medical history of stage 3 colon cancer s/p partial colectomy and end colostomy on 5/4, recent chemo start with 3rd cycle on 7/22, sleep apnea, seizures, epilepsy, brain tumor, HTN, HLD, GERD, asthma, peripheral neuropathy, urinary frequency, DM, MCA stroke. He presented to the ED via EMS due to abdominal pain x2 weeks, anorexia, and weakness. Admitted for close monitoring for concern of mesenteric ischemia. Patient reported that abdominal pain was worse with eating PTA.  07/31 - CRRT initiated 08/09 - CRRT stopped  08/10 - transfer to [facility] for trach placement and iHD 08/11 - s/p trach 08/12 - Cortrak placed (tip of tube in stomach) 08/15 - s/p placement of [facility] 08/22 - pulled trach out, replaced under bronch guidance, s/p bronch revealing mild area of granulation tissue of top of trach tube 08/25 - pulled trach out, transferred to ICU, emergently intubated 08/29 - trach replaced with ENT in OR 09/04 - tx out of ICU 09/09 - changed to cuffless trach 09/22 - diet advanced to clears 09/23 - made  NPO  09/27 - TF held 2/2 emesis 09/30 - s/p EGD which revealed grade LA C reflux esophagitis/non-bleeding duodenal ulcers 10/03 - Cortrak removed, pt failed MBSS 10/04 - s/p PEG placement 10/05 - pt placed on vent support, transfer to ICU, s/p bronch showing significant mucus plugging 10/06 - back on trach collar 10/07 - trach changed 10/10 - tx back to floor 10/28 - capping trials initiated 10/31 - pt decannulated; diet advanced to dysphagia 3 with thin liquids; TDC cath removed  Per MD, pt's renal function has improved with adequate UOP w/o any recurrent urinary retention. Pt no longer needing HD. NP reports pt with good intake so TF orders were discontinued today. Unfortunately, no PO intake documented to confirm adequacy of intake. Discussed pt with RN who reports pt doing well.   Pt in great spirits at time of RD visit. Pt happy about progress he has made and reports appetite is pretty good. Discussed need for increased protein/calorie intake and supplement options. Pt agreeable to Ensure and Prosource. Pt also agreeable to education on improving nutrition after discharge which was provided by RD.   Admit weight: 111.8 kg Current weight: 108.3 kg (updated 03/09/21)  Medications: SSI, IV phenergan  Labs: Na 134 (L) CBGs: 90-132 x 24 hours  UOP: 230ml x24 hours Colostomy output: 751ml x24 hours I/Os: -30.4L since admit  Diet Order:   Diet Order             DIET DYS 3 Room service appropriate? Yes; Fluid consistency: Thin  Diet effective now  EDUCATION NEEDS:   No education needs have been identified at this time  Skin:  Skin Assessment: Skin Integrity Issues: Stage II: coccyx  Last BM:  11/2 colostomy  Height:   Ht Readings from Last 1 Encounters:  01/01/21 5\' 8"  (1.727 m)    Weight:   Wt Readings from Last 1 Encounters:  03/09/21 108.3 kg    Ideal Body Weight:  70 kg  BMI:  Body mass index is 36.3 kg/m.  Estimated Nutritional  Needs:   Kcal:  2300-2500  Protein:  120-140 gm  Fluid:  >2L    Larkin Ina, MS, RD, LDN (she/her/hers) RD pager number and weekend/on-call pager number located in [place].

## 2021-03-11 NOTE — TOC Progression Note (Signed)
Transition of Care [facility]) - Progression Note    Patient Details  Name: Thomas [name] MRN: [id number] Date of Birth: 1955-05-26  Transition of Care [facility]) CM/SW Contact  Curlene Labrum, RN Phone Number: 03/11/2021, 9:05 AM  Clinical Narrative:    Case management spoke with the patient's wife on the phone this morning and gave her Medicare choice regarding SNf placement and the wife chose [facility] and gave permission to start insurance authorization for the patient.  The patient's wife is familiar with the facility and plans to meet with admissions and tour today at 1100 am.  Madilyn Fireman, MSW is starting insurance authorization for placement at this time.  CM and MSW will continue to follow the patient for SNF placement.   Expected Discharge Plan: [place] Barriers to Discharge: Continued Medical Work up  Expected Discharge Plan and Services Expected Discharge Plan: [place]   Discharge Planning Services: CM Consult Post Acute Care Choice: IP Rehab (IP Rehab versus SNF for rehab - has tracheostomy and HD care needs.) Living arrangements for the past 2 months: Single Family Home                                       Social Determinants of Health (SDOH) Interventions    Readmission Risk Interventions Readmission Risk Prevention Plan 02/16/2021  Transportation Screening Complete  Medication Review Press photographer) Complete  PCP or Specialist appointment within 3-5 days of discharge Complete  HRI or Home Care Consult Complete  SW Recovery Care/Counseling Consult Complete  Palliative Care Screening Complete  Skilled Nursing Facility Complete  Some recent data might be hidden

## 2021-03-11 NOTE — Progress Notes (Signed)
[facility] PROGRESS NOTE  RASUL DECOLA CHE:[id number] DOB: 04/17/1956 DOA: 11/30/2020 PCP: [name], NP   03/03/2021  Status: Inpatient: Unsafe discharge plan  Barriers to discharge:  Social: Like to discharge straight home but wife unable to physically manage patient noting he needs regular PT and OT for rehabilitation  Clinical: Continues to have significant physical deconditioning-wife unable to manage him currently and both patient and wife agree to short-term SNF for rehab  Level of care:  Telemetry Medical  Code Status: Full Family communication: Wife on 10/28 DVT prophylaxis: Eliquis COVID vaccination status: Moderna 07/08/2019 and 08/11/2019 with first booster dose given 08/05/2020   HPI:  65 y.o. male with a history of T2DM with neuropathy, HTN, HLD., colon cancer s/p resection and colostomy May 2022 s/p 3 cycles chemotherapy (last was July 2022). Followed by Dr. Benay Spice. Started CAPOX June 2022, reduced cepecitabine dose starting 11/19/2020 due to hand/foot syndrome, reduced oxaliplatin due to renal impairment and poor performance status. Presented to the ED 7/24 with abdominal pain found to be febrile, tachycardic, tachypneic. Lactic acid was 6.6 and persisted at 6.8 after sepsis IV fluid bolus and cefepime. He had ARF, demand ischemia, bandemia, was hyperglycemic with negative ketones, with lactic acidosis. He developed AFib with RVR in the ED, was started on diltiazem, and was admitted to the ICU for septic shock with unclear source. Sepsis was subsequently felt to be ruled out at time of admission, presentation felt more likely to be related to cepacitabine toxicity with oral mucositis, hand/foot syndrome, and diarrhea/high output colostomy.   He was transferred to hospitalist service 7/27 but developed metabolic encephalopathy with hyoxia due to aspiration pneumonia on 7/30 requiring intubation and pressor support. Failed extubation with inability to clear  secretions, requiring reintubation 8/8, and tracheostomy subsequently placed 8/11. This was pulled out 8/22, replaced under bronch guidance, again removed 8/25 requiring reintubation after failed reinsertion. ENT revised tracheostomy in the OR 8/29. He has required IHD for ARF, and also had complications of RLE DVT now on eliquis, colitis thought to be ischemic 8/2 not requiring specific intervention, Pseudomonas pneumonia having completed treatment, agitated encephalopathy which has improved, anemia requiring 4u PRBCs total, and thrombocytopenia which has resolved. On 9/4 he was transferred to hospitalist service having tolerated trach collar.  Patient was also evaluated by Dr. Benay Spice during this admission clarified that patient was receiving curative chemotherapy prior to admission  Subsequently patient has had issues with bronchial plugging requiring urgent bronchoscopy and transient readmission to the ICU.  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  He has had episodes of aspiration pneumonitis.  He has had recurrent urinary retention despite several attempts to remove Foley therefore Foley remains in place.  He has had 2 episodes of funguria.  As of 10/26 patient has made significant improvements in status and he is able to talk and swallowing is greatly improved as well as tongue mobility.  Trach team initiated capping trials as of 10/28 she was successfully decannulated on 10/31.  SLP has advanced to D3 diet and have signed off.  As of 11/2 cardiac monitoring is no longer indicated and I have written an order to transfer patient to medical surgical floor   Subjective:  Alert.  Very excited over his significant improvement I will get a discharge disposition appears to be imminent  Objective: Vitals:   03/10/21 2025 03/11/21 0346  BP: (!) 153/102 (!) 152/86  Pulse: 99 94  Resp: 18 20  Temp: 97.7 F (36.5 C) (!) 97.1 F  (36.2 C)  SpO2: 100% 100%    Intake/Output Summary (Last 24 hours) at 03/11/2021 6045 Last data filed at 03/10/2021 2040 Gross per 24 hour  Intake 240 ml  Output 900 ml  Net -660 ml     Filed Weights   03/07/21 0307 03/08/21 0500 03/09/21 0500  Weight: 108.4 kg 108.9 kg 108.3 kg    Exam: Constitutional: Alert, no acute distress Respiratory: RA, Lung sounds CTA, O2 99%, no increased work of breathing  Cardiac: S1-S2, regular pulse without tachycardia, no peripheral edema, normotensive Abdomen: Abdomen soft, nontender, PEG tube in place with tube feedings now discontinued normoactive bowel sounds.  Colostomy with light brown liquid stool  Neurologic: CN 2-12 grossly intact.  Strength of both upper and lower extremities estimated 3-4/5.   Psychiatric: Awakens, oriented x3 at present.  Pleasant affect.   Assessment/Plan: Acute problems: Sepsis with hypotension -Suspect aspiration pneumonia sewed of postdialysis emesis -Sepsis physiology has resolved -Recurrent funguria-completed Diflucan on 10/30  Volume depletion with hypotension -Resolved -Due to excessive high volume liquid stools -Continue scheduled Imodium daily and if output decreases will need to change Imodium to prn   Acute hypoxic respiratory failure s/p tracheostomy due to aspiration pneumonia /Recurrent pansensitive Pseudomonas tracheobronchitis with mucous plugging on 10/7 -Status post bronchoscopy  -successfully decannulated on 10/31 as of 11/2 tolerating  Symptomatic anemia/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 -He has received a total of 5 units PRBCs since admission with current hemoglobin stable at 8.7  Acute renal failure on stage IIIb CKD/Presumed ischemic ATN -Renal function has remained stable and patient has maintain adequate urinary output therefore no longer requiring hemodialysis -TDC catheter removed on 10/31 in IR-renal function remained stable and has adequate urinary output  without any recurrent urinary retention  Acute delirium -Resolved  -2/2 benzodiazepine withdrawal -Continue Klonopin 0.5 mg BID  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/known hand-and-foot syndrome -Continue Neurontin at bedtime -Allow for low-dose prn Oxy IR  Grade LA C reflux esophagitis/nonbleeding duodenal ulcers -PPI for total of 4-weeks (initiated 9/29-and discontinued 11/2) -Follow-up EGD recommended after discharge  Inability to elevate tongue  -Resolved  Urinary retention -Foley catheter removed on 10/31 without recurrence of urinary retention  Dysphagia/protein calorie malnutrition -PEG placed 10/4 -10/31 SLP advanced to D3 diet and has tolerated well so as of 11/2 will discontinue tube feed -PEG tube was placed on 10/4 therefore need to wait at least 6 weeks before removing tube  MRI with bilateral punctate lesion (Frontal watershed) -Stroke team differential included thrombotic etiology from either A. 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 -CBGs well controlled with SSI alone.  Continue to hold Levemir for now -Gabapentin resumed   HTN:  -BP controlled off of Norvasc   HLD:  -Resume atorvastatin      Pressure Injury 02/05/21 Coccyx Lower Stage 2 -  Partial thickness loss of dermis presenting as a shallow open injury with a red, pink wound bed without slough. healing quarter sized spot on sacrum (Active)  Date First Assessed/Time First Assessed: 02/05/21 0800   Location: Coccyx  Location Orientation: Lower  Staging: Stage 2 -  Partial thickness loss of dermis presenting as a shallow open injury with a red, pink wound bed without slough.  Wound Descript...    Assessments 02/05/2021  5:10 PM 03/08/2021  9:13 AM  Dressing Type Foam - Lift dressing to assess site every shift Foam - Lift dressing to assess site every shift  Dressing Changed Clean;Dry;Intact  Dressing Change Frequency -- Every 3 days  State of Healing Early/partial granulation --  Site / Wound Assessment Pink Clean;Dry  % Wound base Red or Granulating 100% --  Peri-wound Assessment -- Intact  Wound Length (cm) 2 cm --  Wound Width (cm) 2 cm --  Wound Surface Area (cm^2) 4 cm^2 --  Margins Attached edges (approximated) --  Drainage Amount None None  Treatment Cleansed --     No Linked orders to display     Data Reviewed: Basic Metabolic Panel: Recent Labs  Lab 03/06/21 0523 03/07/21 0545 03/08/21 0500 03/09/21 0455 03/10/21 1050  NA 134* 132* 133* 134* 136  K 3.4* 3.4* 3.4* 4.2 3.5  CL 99 98 98 102 109  CO2 27 26 26 26 22   GLUCOSE 124* 124* 130* 99 124*  BUN 26* 29* 33* 34* 32*  CREATININE 2.95* 2.90* 2.95* 2.95* 2.47*  CALCIUM 9.1 8.9 9.0 9.2 7.7*  PHOS 2.7 3.2 2.8 3.1 2.6   Liver Function Tests: Recent Labs  Lab 03/06/21 0523 03/07/21 0545 03/08/21 0500 03/09/21 0455 03/10/21 1050  ALBUMIN 2.2* 2.2* 2.3* 2.4* 2.0*    CBC: Recent Labs  Lab 03/07/21 0545  WBC  10.6*  HGB 8.4*  HCT 24.1*  MCV 80.9  PLT 340    CBG: Recent Labs  Lab 03/10/21 1158 03/10/21 1546 03/10/21 1944 03/10/21 2251 03/11/21 0344  GLUCAP 133* 125* 116* 122* 132*    Scheduled Meds:  apixaban  5 mg Oral BID   atorvastatin  20 mg Oral QHS   chlorhexidine  15 mL Mouth Rinse BID   Chlorhexidine Gluconate Cloth  6 each Topical Q0600   clonazePAM  0.5 mg Oral BID   diclofenac Sodium  4 g Topical QID   gabapentin  100 mg Oral QHS   hydrocerin   Topical BID   insulin aspart  0-15 Units Subcutaneous TID WC   levETIRAcetam  500 mg Oral BID   mouth rinse  15 mL Mouth Rinse q12n4p   pantoprazole  40 mg Oral Daily   Continuous Infusions:  sodium chloride 250 mL (02/25/21 1021)   promethazine (PHENERGAN) injection (IM or IVPB) 12.5 mg (02/24/21 1950)    Principal Problem:   Diabetes mellitus with hyperosmolarity without hyperglycemic hyperosmolar nonketotic coma (HCC) Active Problems:   Lactic acidosis   Acute renal failure superimposed on stage 2 chronic kidney disease (HCC)   Metabolic acidosis   Tachycardia   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 (HCC)   Adverse effect of chemotherapy  Pressure injury of skin   Atrial fibrillation with RVR (HCC)   Sepsis (HCC)   Malnutrition of moderate degree   Acute metabolic encephalopathy   Acute respiratory failure with hypoxemia (HCC)   Acute renal failure with acute cortical necrosis (HCC)   Shock ([place])   Status post tracheostomy ([place])   Endotracheal tube present   Hemodialysis status ([place])   Palliative care by specialist   SOB (shortness of breath)   Non-intractable vomiting   Abdominal pain, epigastric   Cerebral embolism with cerebral infarction   Acute esophagitis   Duodenal ulcer disease   Consultants: PCCM General surgery Nephrology Hematology/oncology Palliative care medicine team  Procedures: 2u PRBC 7/31, 1u PRBC 8/17, 1u PRBC 8/27 Tracheostomy revision 01/05/2021 Dr.  Constance Holster [facility] 8/22 10/7 briefly changed to cuffed trach to allow for short-term mechanical ventilation and subsequently changed back to cuffless trach on 10/8    Antibiotics: Vancomycin 7/24 Cefepime 7/24 - 7/27 Unasyn 7/30 >> cefepime/flagyl 7/31 - 8/6  Cefazolin x1 dose on 10/ Zosyn 10/5 through 10/9 Vancomycin 10/5 through 10/6 10/19 Zosyn >>10/26 Diflucan 10/20 through 10/26 to p.o. on 10/27    Time spent: 20 minutes    [name] ANP  [facility] 7 am - 330 pm/M-F for direct patient care and secure chat Please refer to Amion for contact info 100  days

## 2021-03-11 NOTE — Progress Notes (Signed)
CSW spoke with [facility] representative and they do not manage this patient's plan.  CSW spoke with Juliann Pulse at [facility] who states the facility will initiate authorization. Juliann Pulse states patient's granddaughter intends to tour the facility today at [place], MSW, LCSW Transitions of Care  Clinical Social Worker II [phone]

## 2021-03-12 ENCOUNTER — Encounter: Payer: Self-pay | Admitting: *Deleted

## 2021-03-12 DIAGNOSIS — E1100 Type 2 diabetes mellitus with hyperosmolarity without nonketotic hyperglycemic-hyperosmolar coma (NKHHC): Secondary | ICD-10-CM | POA: Diagnosis not present

## 2021-03-12 LAB — RENAL FUNCTION PANEL
Albumin: 2.5 g/dL — ABNORMAL LOW (ref 3.5–5.0)
Anion gap: 7 (ref 5–15)
BUN: 39 mg/dL — ABNORMAL HIGH (ref 8–23)
CO2: 25 mmol/L (ref 22–32)
Calcium: 9.4 mg/dL (ref 8.9–10.3)
Chloride: 106 mmol/L (ref 98–111)
Creatinine, Ser: 2.75 mg/dL — ABNORMAL HIGH (ref 0.61–1.24)
GFR, Estimated: 25 mL/min — ABNORMAL LOW (ref >60)
Glucose, Bld: 102 mg/dL — ABNORMAL HIGH (ref 70–99)
Phosphorus: 4.1 mg/dL (ref 2.5–4.6)
Potassium: 4.3 mmol/L (ref 3.5–5.1)
Sodium: 138 mmol/L (ref 135–145)

## 2021-03-12 LAB — GLUCOSE, CAPILLARY
Glucose-Capillary: 102 mg/dL — ABNORMAL HIGH (ref 70–99)
Glucose-Capillary: 114 mg/dL — ABNORMAL HIGH (ref 70–99)
Glucose-Capillary: 115 mg/dL — ABNORMAL HIGH (ref 70–99)

## 2021-03-12 LAB — RESP PANEL BY RT-PCR (FLU A&B, COVID) ARPGX2
Influenza A by PCR: NEGATIVE
Influenza B by PCR: NEGATIVE
SARS Coronavirus 2 by RT PCR: NEGATIVE

## 2021-03-12 MED ORDER — ENSURE ENLIVE PO LIQD: 237.0000 mL | Freq: Three times a day (TID) | ORAL | 12 refills | Status: DC

## 2021-03-12 MED ORDER — ADULT MULTIVITAMIN W/MINERALS CH: 1.0000 | ORAL_TABLET | Freq: Every day | ORAL | Status: DC

## 2021-03-12 MED ORDER — ACETAMINOPHEN 325 MG PO TABS
650.0000 mg | ORAL_TABLET | Freq: Four times a day (QID) | ORAL | Status: DC | PRN
Start: 2021-03-12 — End: 2022-06-15

## 2021-03-12 MED ORDER — PROSOURCE PLUS PO LIQD: 30.0000 mL | Freq: Two times a day (BID) | ORAL | Status: DC

## 2021-03-12 NOTE — Progress Notes (Signed)
Occupational Therapy Treatment Patient Details Name: Thomas [name] MRN: [id number] DOB: 05-23-1955 Today's Date: 03/12/2021   History of present illness 65 y.o. male admitted 7/24 with high ostomy output. Pt with severe sepsis and multisystem organ failure. Hospital admission complicated by volume overload, aspiration pneumonia, acute respiratory failure, AKI and ischemic colitis. Intubated 7/30. S/p trach placement 8/11. [facility] 8/12. HD initiated 8/12. To IR 8/15 for tunneled HD cath. Pulled out trach x2 8/22 replaced by PCCM. Pulled out trach again 8/25 but unable to be reinserted with pt intubated. 8/29 to OR for stoma clean up and trach replacement.Trach downsized 9/10. 10/5 respiratory distress with mucous plug requiring bronch, trach change and ventilator 10/5-10/6.  10/6 transitioned to trach collar, 10/7 pulled trach and inserted by RT with no complications. MD held HD 10/26 given increased urine output. Trach decannulated and HD catheter removed 10/31. 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   OT comments  Patient received supine with wife in room. Patient states he has wounds on bottom of his feet and are painful to stand on but patient was willing to address HEP sitting on EOB. Patient was instructed on HEP with theraband exercises and education for wife for good carryover. Patient returned to supine following treatment. Acute OT to continue to follow.    Recommendations for follow up therapy are one component of a multi-disciplinary discharge planning process, led by the attending physician.  Recommendations may be updated based on patient status, additional functional criteria and insurance authorization.    Follow Up Recommendations  Skilled nursing-short term rehab (<3  hours/day)    Assistance Recommended at Discharge Frequent or constant Supervision/Assistance  Equipment Recommendations  Other (comment)    Recommendations for Other Services Other (comment)    Precautions / Restrictions Precautions Precautions: Fall Precaution Comments: ostomy, L heel wound Restrictions Weight Bearing Restrictions: No       Mobility Bed Mobility Overal bed mobility: Needs Assistance Bed Mobility: Supine to Sit;Sit to Supine     Supine to sit: Min guard;HOB elevated Sit to supine: Mod assist   General bed mobility comments: required more assistnace due to sores on feet    Transfers                         Balance Overall balance assessment: Needs assistance Sitting-balance support: No upper extremity supported;Feet supported Sitting balance-Leahy Scale: Fair Sitting balance - Comments: able to perform BUE HEP while sitting on eob                                   ADL either performed or assessed with clinical judgement   ADL                                               Vision       Perception     Praxis      Cognition Arousal/Alertness: Awake/alert Behavior During Therapy: WFL for tasks assessed/performed Overall Cognitive Status: Impaired/Different from baseline Area of Impairment: Attention;Following commands;Memory;Safety/judgement;Problem solving  Orientation Level: Disoriented to;Time Current Attention Level: Selective Memory: Decreased short-term memory Following Commands: Follows one step commands with increased time Safety/Judgement: Decreased awareness of deficits Awareness: Intellectual Problem Solving: Slow processing General Comments: willing to participate but did not want to stand due to wounds on bottom of feet          Exercises Exercises: General Upper Extremity General Exercises - Upper Extremity Shoulder Flexion: Strengthening;Both;10  reps;Theraband Theraband Level (Shoulder Flexion): Level 2 (Red) Shoulder Extension: Strengthening;Both;10 reps;Theraband Theraband Level (Shoulder Extension): Level 2 (Red) Shoulder ABduction: Strengthening;Both;10 reps;Supine;Theraband Theraband Level (Shoulder Abduction): Level 2 (Red) Elbow Flexion: Strengthening;Both;15 reps;Seated;Theraband Theraband Level (Elbow Flexion): Level 2 (Red) Elbow Extension: Strengthening;Both;15 reps;Seated;Theraband Theraband Level (Elbow Extension): Level 2 (Red)   Shoulder Instructions       General Comments      Pertinent Vitals/ Pain       Pain Assessment: Faces Faces Pain Scale: Hurts even more Breathing: normal Negative Vocalization: occasional moan/groan, low speech, negative/disapproving quality Facial Expression: facial grimacing Body Language: relaxed Consolability: no need to console PAINAD Score: 3 Facial Expression: Grimacing Body Movements: Absence of movements Muscle Tension: Relaxed Compliance with ventilator (intubated pts.): N/A Vocalization (extubated pts.): N/A CPOT Total: 2 Pain Location: heels L>R (with wound, RN aware) Pain Descriptors / Indicators: Sore;Discomfort;Moaning Pain Intervention(s): Monitored during session;Repositioned  Home Living                                          Prior Functioning/Environment              Frequency  Min 2X/week        Progress Toward Goals  OT Goals(current goals can now be found in the care plan section)  Progress towards OT goals: Progressing toward goals  Acute Rehab OT Goals OT Goal Formulation: With patient Time For Goal Achievement: 03/18/21 Potential to Achieve Goals: Good ADL Goals Pt Will Perform Grooming: with set-up;sitting Pt Will Perform Upper Body Dressing: with set-up;sitting Pt Will Perform Lower Body Dressing: with mod assist;sit to/from stand Pt Will Transfer to Toilet: with supervision;ambulating;bedside commode Pt  Will Perform Toileting - Clothing Manipulation and hygiene: with supervision;sit to/from stand Pt Will Perform Tub/Shower Transfer: Tub transfer;Shower transfer;3 in Energy manager will Perform Home Exercise Program: Increased ROM;Increased strength;Both right and left upper extremity Additional ADL Goal #1: Patient will perform bed mobility with Min A in prep for ADLs. Additional ADL Goal #2: [place] will complete sit to stand transfers with Mod A +2 in prep for ADLs. Additional ADL Goal #3: Patient will attend to 2/3 grooming tasks without cues for continuation indicating increased attention to task.  Plan Discharge plan remains appropriate;Frequency remains appropriate    Co-evaluation                 AM-PAC OT "6 Clicks" Daily Activity     Outcome Measure   Help from another person eating meals?: A Little Help from another person taking care of personal grooming?: A Little Help from another person toileting, which includes using toliet, bedpan, or urinal?: Total Help from another person bathing (including washing, rinsing, drying)?: Total Help from another person to put on and taking off regular upper body clothing?: A Lot Help from another person to put on and taking off regular lower body clothing?: Total 6 Click Score: 11    End of Session    OT Visit  Diagnosis: Unsteadiness on feet (R26.81);Muscle weakness (generalized) (M62.81);Pain Pain - part of body: Ankle and joints of foot   Activity Tolerance Patient tolerated treatment well   Patient Left in bed;with call bell/phone within reach;with nursing/sitter in room;with family/visitor present   Nurse Communication Other (comment) (discussed wounds on feet)        Time: 0698-6148 OT Time Calculation (min): 33 min  Charges: OT General Charges $OT Visit: 1 Visit OT Treatments $Therapeutic Activity: 8-22 mins $Therapeutic Exercise: 8-22 mins  Lodema Hong, OTA Acute Rehabilitation Services  Pager  [phone] Office [phone]   Trixie Dredge 03/12/2021, 2:51 PM

## 2021-03-12 NOTE — Progress Notes (Signed)
[facility] PROGRESS NOTE  Thomas [name] MOQ:[id number] DOB: 1955-09-22 DOA: 11/30/2020 PCP: [name], NP   03/03/2021  Status: Inpatient: Unsafe discharge plan  Barriers to discharge:  Social: Like to discharge straight home but wife unable to physically manage patient noting he needs regular PT and OT for rehabilitation  Clinical: Continues to have significant physical deconditioning-wife unable to manage him currently and both patient and wife agree to short-term SNF for rehab  Level of care:  Telemetry Medical  Code Status: Full Family communication: Wife on 10/31 DVT prophylaxis: Eliquis COVID vaccination status: Moderna 07/08/2019 and 08/11/2019 with first booster dose given 08/05/2020   HPI:  65 y.o. male with a history of T2DM with neuropathy, HTN, HLD., colon cancer s/p resection and colostomy May 2022 s/p 3 cycles chemotherapy (last was July 2022). Followed by Dr. Benay Spice. Started CAPOX June 2022, reduced cepecitabine dose starting 11/19/2020 due to hand/foot syndrome, reduced oxaliplatin due to renal impairment and poor performance status. Presented to the ED 7/24 with abdominal pain found to be febrile, tachycardic, tachypneic. Lactic acid was 6.6 and persisted at 6.8 after sepsis IV fluid bolus and cefepime. He had ARF, demand ischemia, bandemia, was hyperglycemic with negative ketones, with lactic acidosis. He developed AFib with RVR in the ED, was started on diltiazem, and was admitted to the ICU for septic shock with unclear source. Sepsis was subsequently felt to be ruled out at time of admission, presentation felt more likely to be related to cepacitabine toxicity with oral mucositis, hand/foot syndrome, and diarrhea/high output colostomy.   He was transferred to hospitalist service 7/27 but developed metabolic encephalopathy with hyoxia due to aspiration pneumonia on 7/30 requiring intubation and pressor support. Failed extubation with inability to clear  secretions, requiring reintubation 8/8, and tracheostomy subsequently placed 8/11. This was pulled out 8/22, replaced under bronch guidance, again removed 8/25 requiring reintubation after failed reinsertion. ENT revised tracheostomy in the OR 8/29. He has required IHD for ARF, and also had complications of RLE DVT now on eliquis, colitis thought to be ischemic 8/2 not requiring specific intervention, Pseudomonas pneumonia having completed treatment, agitated encephalopathy which has improved, anemia requiring 4u PRBCs total, and thrombocytopenia which has resolved. On 9/4 he was transferred to hospitalist service having tolerated trach collar.  Patient was also evaluated by Dr. Benay Spice during this admission clarified that patient was receiving curative chemotherapy prior to admission  Subsequently patient has had issues with bronchial plugging requiring urgent bronchoscopy and transient readmission to the ICU.  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  He has had episodes of aspiration pneumonitis.  He has had recurrent urinary retention despite several attempts to remove Foley therefore Foley remains in place.  He has had 2 episodes of funguria.  As of 10/26 patient has made significant improvements in status and he is able to talk and swallowing is greatly improved as well as tongue mobility.  Trach team initiated capping trials as of 10/28 she was successfully decannulated on 10/31.  SLP has advanced to D3 diet and have signed off.  As of 11/2 cardiac monitoring is no longer indicated and I have written an order to transfer patient to medical surgical floor   Subjective:  Sitting up in bed.  Nurse at bedside to perform COVID screening.  Objective: Vitals:   03/11/21 2033 03/12/21 0404  BP: (!) 159/89 133/83  Pulse: (!) 104 95  Resp:    Temp: 98.2  F (36.8 C) 98.2 F (36.8 C)  SpO2: 96% 98%    Intake/Output Summary  (Last 24 hours) at 03/12/2021 0716 Last data filed at 03/11/2021 1206 Gross per 24 hour  Intake --  Output 1750 ml  Net -1750 ml     Filed Weights   03/08/21 0500 03/09/21 0500 03/12/21 0404  Weight: 108.9 kg 108.3 kg 106.9 kg    Exam: Constitutional: Alert and calm Respiratory: RA, Lung sounds CTA on anterior exam, O2 100 %, no increased work of breathing  Cardiac: S1-S2, regular pulse without tachycardia, no peripheral edema, normotensive Abdomen: Abdomen soft, nontender, PEG tube in place , normoactive bowel sounds.  Colostomy with light brown liquid stool  Neurologic: CN 2-12 grossly intact.  Strength of both upper and lower extremities estimated 3-4/5.   Psychiatric: Awakens, oriented x3 at present.  Pleasant affect.   Assessment/Plan: Acute problems: Sepsis with hypotension -Suspect aspiration pneumonia of postdialysis emesis -Sepsis physiology has resolved -Recurrent funguria-completed Diflucan on 10/30  Volume depletion with hypotension -Resolved -Due to excessive high volume liquid stools -Continue scheduled Imodium daily and if output decreases will need to change Imodium to prn   Acute hypoxic respiratory failure s/p tracheostomy due to aspiration pneumonia /Recurrent pansensitive Pseudomonas tracheobronchitis with mucous plugging on 10/7 -Status post bronchoscopy  -successfully decannulated on 10/31 as of 11/2 tolerating  Symptomatic anemia/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 -He has received a total of 5 units PRBCs since admission with current hemoglobin stable at 8.7  Acute renal failure on stage IIIb CKD/Presumed ischemic ATN -Renal function has remained stable and patient has maintain adequate urinary output therefore no longer requiring hemodialysis -Creatinine 2.75 with a BUN of 39 on 11/3-now receiving antihypertensive medications that this should help improve renal function/renal perfusion -TDC catheter removed on 10/31 in IR-renal  function remained stable and has adequate urinary output without any recurrent urinary retention  Acute delirium -Resolved  -2/2 benzodiazepine withdrawal -Continue Klonopin 0.5 mg BID  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/known hand-and-foot syndrome -Continue Neurontin at bedtime -Allow for low-dose prn Oxy IR  Grade LA C reflux esophagitis/nonbleeding duodenal ulcers -PPI for total of 4-weeks (initiated 9/29-and discontinued 11/2) -Follow-up EGD recommended after discharge  Inability to elevate tongue  -Resolved  Urinary retention -Foley catheter removed on 10/31 without recurrence of urinary retention  Dysphagia/protein calorie malnutrition -PEG placed 10/4 -10/31 SLP advanced to D3 diet and has tolerated well so as of 11/2 will discontinue tube feed -PEG tube was placed on 10/4 therefore need to wait at least 6 weeks before removing tube  MRI with bilateral punctate lesion (Frontal watershed) -Stroke team differential included thrombotic etiology from either A. 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 -CBGs well controlled with SSI alone.  Continue to hold Levemir for now -Continue gabapentin    HTN:  -BP much better controlled after resumption of Norvasc on 11/2   HLD:  -Resume atorvastatin      Pressure Injury 02/05/21 Coccyx Lower Stage 2 -  Partial thickness loss of dermis presenting as a shallow open injury with a red, pink wound bed without slough. healing quarter sized spot on sacrum (Active)  Date First Assessed/Time First Assessed: 02/05/21 0800   Location: Coccyx  Location Orientation: Lower  Staging: Stage 2 -  Partial thickness loss of dermis presenting as a shallow open injury with a red, pink wound bed without slough.  Wound Descript...    Assessments 02/05/2021  5:10 PM 03/11/2021  9:15 PM  Dressing Type Foam - Lift dressing to assess site every shift --  Dressing Changed Clean;Dry;Intact  State of Healing Early/partial granulation --  Site / Wound Assessment Pink --  % Wound base Red or Granulating 100% --  Wound Length (cm) 2 cm --  Wound Width (cm) 2 cm --  Wound Surface Area (cm^2) 4 cm^2 --  Margins Attached edges (approximated) --  Drainage Amount None --  Treatment Cleansed --     No Linked orders to display     Data Reviewed: Basic Metabolic Panel: Recent Labs  Lab 03/08/21 0500 03/09/21 0455 03/10/21 1050 03/11/21 0753 03/12/21 0503  NA 133* 134* 136 134* 138  K 3.4* 4.2 3.5 4.2 4.3  CL 98 102 109 101 106  CO2 26 26 22 25 25   GLUCOSE 130* 99 124* 122* 102*  BUN 33* 34* 32* 37* 39*  CREATININE 2.95* 2.95* 2.47* 2.70* 2.75*  CALCIUM 9.0 9.2 7.7* 9.4 9.4  PHOS 2.8 3.1 2.6 3.5 4.1   Liver Function Tests: Recent Labs  Lab 03/08/21 0500 03/09/21 0455 03/10/21 1050 03/11/21 0753 03/12/21 0503  ALBUMIN 2.3* 2.4* 2.0* 2.6* 2.5*    CBC: Recent Labs  Lab 03/07/21 0545 03/11/21 1010  WBC 10.6* 10.8*  HGB 8.4* 8.7*   HCT 24.1* 25.8*  MCV 80.9 81.6  PLT 340 362    CBG: Recent Labs  Lab 03/10/21 2251 03/11/21 0344 03/11/21 0729 03/11/21 1202 03/11/21 1547  GLUCAP 122* 132* 112* 90 113*    Scheduled Meds:  (feeding supplement) PROSource Plus  30 mL Oral BID BM   amLODipine  10 mg Oral Daily   apixaban  5 mg Oral BID   atorvastatin  20 mg Oral QHS   chlorhexidine  15 mL Mouth Rinse BID   Chlorhexidine Gluconate Cloth  6 each Topical Q0600   clonazePAM  0.5 mg Oral BID   diclofenac Sodium  4 g Topical QID   feeding supplement  237 mL Oral TID BM   gabapentin  100 mg Oral QHS   hydrocerin   Topical BID   insulin aspart  0-15 Units Subcutaneous TID WC   levETIRAcetam  500 mg Oral BID   mouth rinse  15 mL Mouth Rinse q12n4p   multivitamin with minerals  1 tablet Oral Daily   Continuous Infusions:  sodium chloride 250 mL (02/25/21 1021)   promethazine (PHENERGAN) injection (IM or IVPB) 12.5 mg (02/24/21 1950)    Principal Problem:   Diabetes mellitus with hyperosmolarity without hyperglycemic hyperosmolar nonketotic coma ([place]) Active Problems:   Hypertension   Acute renal failure superimposed on stage  2 chronic kidney disease (HCC)   Adverse effect of chemotherapy   Pressure injury of skin   Atrial fibrillation with RVR (HCC)   Sepsis (HCC)   Malnutrition of moderate degree   Acute metabolic encephalopathy   Acute respiratory failure with hypoxemia (HCC)   Acute renal failure with acute cortical necrosis (HCC)   Shock ([place])   Palliative care by specialist   Cerebral embolism with cerebral infarction   Acute esophagitis   Duodenal ulcer disease   Epilepsy ([place])   Hyperlipidemia   Peripheral neuropathy   Consultants: PCCM General surgery Nephrology Hematology/oncology Palliative care medicine team  Procedures: 2u PRBC 7/31, 1u PRBC 8/17, 1u PRBC 8/27 Tracheostomy revision 01/05/2021 Dr. Constance Holster [facility] 8/22 10/7 briefly changed to cuffed trach to allow for short-term  mechanical ventilation and subsequently changed back to cuffless trach on 10/8    Antibiotics: Vancomycin 7/24 Cefepime 7/24 - 7/27 Unasyn 7/30 >> cefepime/flagyl 7/31 - 8/6  Cefazolin x1 dose on 10/ Zosyn 10/5 through 10/9 Vancomycin 10/5 through 10/6 10/19 Zosyn >>10/26 Diflucan 10/20 through 10/26 to p.o. on 10/27    Time spent: 20 minutes    [name] ANP  [facility] 7 am - 330 pm/M-F for direct patient care and secure chat Please refer to Amion for contact info 101  days

## 2021-03-12 NOTE — Progress Notes (Signed)
Physical Therapy Treatment Patient Details Name: Thomas [name] MRN: [id number] DOB: March 25, 1956 Today's Date: 03/12/2021   History of Present Illness 65 y.o. male admitted 7/24 with high ostomy output. Pt with severe sepsis and multisystem organ failure. Hospital admission complicated by volume overload, aspiration pneumonia, acute respiratory failure, AKI and ischemic colitis. Intubated 7/30. S/p trach placement 8/11. [facility]Trak 8/12. HD initiated 8/12. To IR 8/15 for tunneled HD cath. Pulled out trach x2 8/22 replaced by PCCM. Pulled out trach again 8/25 but unable to be reinserted with pt intubated. 8/29 to OR for stoma clean up and trach replacement.Trach downsized 9/10. 10/5 respiratory distress with mucous plug requiring bronch, trach change and ventilator 10/5-10/6.  10/6 transitioned to trach collar, 10/7 pulled trach and inserted by RT with no complications. MD held HD 10/26 given increased urine output. Trach decannulated and HD catheter removed 10/31. 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    PT Comments    Pt willing to work with PT, but not to get OOB to chair do standing exercise.  Emphasis on transition to EOB, balance and exercise at EOB including quad presses and hamstring curls at EOB with graded resistance bil x10 reps.    Recommendations for follow up therapy are one component of a multi-disciplinary discharge planning process, led by the attending physician.  Recommendations may be updated based on patient status, additional functional criteria and insurance authorization.  Follow Up Recommendations  Skilled nursing-short term rehab (<3 hours/day)     Assistance Recommended at Discharge Intermittent Supervision/Assistance  Equipment Recommendations  Wheelchair  (measurements PT);Wheelchair cushion (measurements PT);Hospital bed    Recommendations for Other Services       Precautions / Restrictions Precautions Precautions: Fall Precaution Comments: ostomy, L heel wound Restrictions Weight Bearing Restrictions: No     Mobility  Bed Mobility Overal bed mobility: Needs Assistance Bed Mobility: Supine to Sit;Sit to Supine   Sidelying to sit: Min assist;HOB elevated Supine to sit: Min guard;HOB elevated Sit to supine: Mod assist   General bed mobility comments: up via L elbow to EOB.  Pt needed moderate assist to complete w/shift L to scoot R pelvis forward.    Transfers                   General transfer comment: pt deferred due to bil feet wounds    Ambulation/Gait                 Stairs             Wheelchair Mobility    Modified Rankin (Stroke Patients Only)       Balance Overall balance assessment: Needs assistance Sitting-balance support: No upper extremity supported;Feet supported Sitting balance-Leahy Scale: Fair Sitting balance - Comments: able to perform BUE HEP while sitting on eob                                    Cognition Arousal/Alertness: Awake/alert Behavior During Therapy: WFL for tasks assessed/performed Overall Cognitive Status: Impaired/Different from baseline Area of Impairment: Attention;Following commands;Memory;Safety/judgement;Problem solving                 Orientation Level: Disoriented to;Time Current Attention Level: Selective Memory: Decreased short-term memory Following Commands: Follows one step commands  with increased time Safety/Judgement: Decreased awareness of deficits Awareness: Intellectual Problem Solving: Slow processing General Comments: willing to participate but did not want to stand due to wounds on bottom of feet        Exercises General Exercises - Upper Extremity Shoulder Flexion: Strengthening;Both;10  reps;Theraband Theraband Level (Shoulder Flexion): Level 2 (Red) Shoulder Extension: Strengthening;Both;10 reps;Theraband Theraband Level (Shoulder Extension): Level 2 (Red) Shoulder ABduction: Strengthening;Both;10 reps;Supine;Theraband Theraband Level (Shoulder Abduction): Level 2 (Red) Elbow Flexion: Strengthening;Both;15 reps;Seated;Theraband Theraband Level (Elbow Flexion): Level 2 (Red) Elbow Extension: Strengthening;Both;15 reps;Seated;Theraband Theraband Level (Elbow Extension): Level 2 (Red) General Exercises - Lower Extremity Long Arc Quad: AROM;Both;10 reps;Other (comment) (graded resistance) Other Exercises Other Exercises: knee flexion bil with graded resistance x 10 reps    General Comments General comments (skin integrity, edema, etc.): vss      Pertinent Vitals/Pain Pain Assessment: Faces Faces Pain Scale: Hurts even more Breathing: normal Negative Vocalization: occasional moan/groan, low speech, negative/disapproving quality Facial Expression: facial grimacing Body Language: relaxed Consolability: no need to console PAINAD Score: 3 Facial Expression: Grimacing Body Movements: Absence of movements Muscle Tension: Relaxed Compliance with ventilator (intubated pts.): N/A Vocalization (extubated pts.): N/A CPOT Total: 2 Pain Location: L > R heel and blistering plantar surface bil Pain Descriptors / Indicators: Grimacing;Guarding Pain Intervention(s): Monitored during session    Home Living                          Prior Function            PT Goals (current goals can now be found in the care plan section) Acute Rehab PT Goals PT Goal Formulation: With patient Time For Goal Achievement: 03/25/21 Potential to Achieve Goals: Good Progress towards PT goals: Progressing toward goals    Frequency    Min 2X/week      PT Plan Current plan remains appropriate    Co-evaluation              AM-PAC PT "6 Clicks" Mobility   Outcome  Measure  Help needed turning from your back to your side while in a flat bed without using bedrails?: A Little Help needed moving from lying on your back to sitting on the side of a flat bed without using bedrails?: A Little Help needed moving to and from a bed to a chair (including a wheelchair)?: A Little Help needed standing up from a chair using your arms (e.g., wheelchair or bedside chair)?: A Little Help needed to walk in hospital room?: Total Help needed climbing 3-5 steps with a railing? : Total 6 Click Score: 14    End of Session   Activity Tolerance: Patient tolerated treatment well;Patient limited by fatigue Patient left: in bed;with call bell/phone within reach;with bed alarm set Nurse Communication: Mobility status PT Visit Diagnosis: Muscle weakness (generalized) (M62.81);Pain Pain - part of body:  (feet)     Time: 2409-7353 PT Time Calculation (min) (ACUTE ONLY): 22 min  Charges:  $Therapeutic Exercise: 8-22 mins                     03/12/2021  Ginger Carne., PT Acute Rehabilitation Services [phone]  (pager) [phone]  (office)   Tessie Fass Arlo Buffone 03/12/2021, 3:42 PM

## 2021-03-12 NOTE — Progress Notes (Addendum)
CSW spoke with Juliann Pulse at [facility] who states the facility has no beds today but can accept the patient tomorrow.  NP placed ordered for a rapid COVID test - unit secretary notified.  Madilyn Fireman, MSW, LCSW Transitions of Care  Clinical Social Worker II [phone]

## 2021-03-13 DIAGNOSIS — N171 Acute kidney failure with acute cortical necrosis: Secondary | ICD-10-CM | POA: Diagnosis not present

## 2021-03-13 DIAGNOSIS — J690 Pneumonitis due to inhalation of food and vomit: Secondary | ICD-10-CM

## 2021-03-13 DIAGNOSIS — E1100 Type 2 diabetes mellitus with hyperosmolarity without nonketotic hyperglycemic-hyperosmolar coma (NKHHC): Secondary | ICD-10-CM | POA: Diagnosis not present

## 2021-03-13 LAB — RENAL FUNCTION PANEL
Albumin: 2.5 g/dL — ABNORMAL LOW (ref 3.5–5.0)
Anion gap: 6 (ref 5–15)
BUN: 41 mg/dL — ABNORMAL HIGH (ref 8–23)
CO2: 25 mmol/L (ref 22–32)
Calcium: 9.1 mg/dL (ref 8.9–10.3)
Chloride: 109 mmol/L (ref 98–111)
Creatinine, Ser: 2.62 mg/dL — ABNORMAL HIGH (ref 0.61–1.24)
GFR, Estimated: 26 mL/min — ABNORMAL LOW (ref >60)
Glucose, Bld: 112 mg/dL — ABNORMAL HIGH (ref 70–99)
Phosphorus: 4.6 mg/dL (ref 2.5–4.6)
Potassium: 4.3 mmol/L (ref 3.5–5.1)
Sodium: 140 mmol/L (ref 135–145)

## 2021-03-13 LAB — GLUCOSE, CAPILLARY: Glucose-Capillary: 129 mg/dL — ABNORMAL HIGH (ref 70–99)

## 2021-03-13 MED ORDER — HEPARIN SOD (PORK) LOCK FLUSH 100 UNIT/ML IV SOLN
500.0000 [IU] | INTRAVENOUS | Status: AC | PRN
  Administered 2021-03-13: 500 [IU]
  Filled 2021-03-13: qty 5

## 2021-03-13 NOTE — Plan of Care (Signed)
  Problem: Health Behavior/Discharge Planning: Goal: Ability to manage health-related needs will improve Outcome: Adequate for Discharge   Problem: Clinical Measurements: Goal: Ability to maintain clinical measurements within normal limits will improve Outcome: Adequate for Discharge Goal: Will remain free from infection Outcome: Adequate for Discharge Goal: Diagnostic test results will improve Outcome: Adequate for Discharge Goal: Respiratory complications will improve Outcome: Adequate for Discharge Goal: Cardiovascular complication will be avoided Outcome: Adequate for Discharge   Problem: Activity: Goal: Risk for activity intolerance will decrease Outcome: Adequate for Discharge   Problem: Nutrition: Goal: Adequate nutrition will be maintained Outcome: Adequate for Discharge   Problem: Coping: Goal: Level of anxiety will decrease Outcome: Adequate for Discharge   Problem: Elimination: Goal: Will not experience complications related to bowel motility Outcome: Adequate for Discharge Goal: Will not experience complications related to urinary retention Outcome: Adequate for Discharge   Problem: Pain Managment: Goal: General experience of comfort will improve Outcome: Adequate for Discharge   Problem: Safety: Goal: Ability to remain free from injury will improve Outcome: Adequate for Discharge   Problem: Skin Integrity: Goal: Risk for impaired skin integrity will decrease Outcome: Adequate for Discharge   Problem: Safety: Goal: Non-violent Restraint(s) Outcome: Adequate for Discharge

## 2021-03-13 NOTE — Progress Notes (Signed)
Patient is going to [facility], room 102 via [employer] - pick up scheduled for 10:30am. The number to call for report is [phone].  RN aware of discharge plan.  Madilyn Fireman, MSW, LCSW Transitions of Care  Clinical Social Worker II [phone]

## 2021-03-16 ENCOUNTER — Encounter: Payer: Self-pay | Admitting: Oncology

## 2021-03-18 ENCOUNTER — Ambulatory Visit (INDEPENDENT_AMBULATORY_CARE_PROVIDER_SITE_OTHER): Payer: Medicare HMO | Admitting: Gastroenterology

## 2021-03-18 ENCOUNTER — Encounter: Payer: Self-pay | Admitting: Gastroenterology

## 2021-03-18 VITALS — BP 140/90 | HR 106

## 2021-03-18 DIAGNOSIS — K219 Gastro-esophageal reflux disease without esophagitis: Secondary | ICD-10-CM | POA: Diagnosis not present

## 2021-03-18 DIAGNOSIS — K269 Duodenal ulcer, unspecified as acute or chronic, without hemorrhage or perforation: Secondary | ICD-10-CM | POA: Diagnosis not present

## 2021-03-18 MED ORDER — OMEPRAZOLE 40 MG PO CPDR: 40.0000 mg | DELAYED_RELEASE_CAPSULE | Freq: Every day | ORAL | 1 refills | Status: DC

## 2021-03-18 NOTE — Patient Instructions (Addendum)
The [facility] providers would like to encourage you to use [facility] to communicate with providers for non-urgent requests or questions.  Due to long hold times on the telephone, sending your provider a message by [facility] may be faster and more efficient way to get a response. Please allow 48 business hours for a response.  Please remember that this is for non-urgent requests/questions.  RECOMMENDATIONS: Increase Omeprazole to 40 MG a day.  Follow up with your surgeon Dr. Bobbye Morton regarding removing the feeding tube.  It was great seeing you today! Thank you for entrusting me with your care and choosing [facility].  Alonza Bogus, PA-C

## 2021-03-18 NOTE — Progress Notes (Signed)
Assessment and plan reviewed 

## 2021-03-18 NOTE — Progress Notes (Signed)
03/18/2021 Thomas [name] [id number] [birth year]   HISTORY OF PRESENT ILLNESS:  This is a 65 y.o. male with a history of T2DM with neuropathy, HTN, HLD, colon cancer s/p resection and colostomy May 2022 s/p 3 cycles chemotherapy (last was July 2022)--followed by Dr. Benay Spice.  Unfortunately he had a very prolonged and complicated hospital course recently:  was admitted to the hospital on 7/24 and not discharged until just last week, 11/3.  Our service saw him in consult on 02/05/2021 for complaints of epigastric abdominal pain with nausea and vomiting.  Please see my consult note for further details.  Nonetheless, he underwent EGD with Dr. Hilarie Fredrickson on 02/06/2021 that showed the following:  -LA Grade C reflux esophagitis with no bleeding. - Normal stomach. Biopsied given duodenal ulcer disease. - Non-bleeding duodenal ulcers with no stigmata of bleeding. Possible source of epigastric pain. - Normal second portion of the duodenum. - No evidence of gastric or duodenal outlet obstruction.  Pathology: -  Reactive gastropathy  -  No H. pylori or intestinal metaplasia identified  We had recommended 40 mg of PPI twice daily for 4 weeks and then 40 mg daily thereafter.  He is only on 20 mg daily currently.  He is here today for follow-up of those issues.  He tells me that he is doing well.  He has no nausea or vomiting.  No abdominal pain.  He is eating well, can eat pretty much anything that he wants, although he is judicious about it.  Has been drinking Glucerna.  He is receiving Imodium as needed for diarrhea/high ostomy output and that does seem to help.  He still has a gastrostomy tube in place that was only placed on 10/4 by interventional radiology.  He is interested in having that removed.  He has not seen Dr. Bobbye Morton who performed his surgery/colon resection.   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   Dizziness    Dyspnea    pt states d/t weight   Epilepsy ([place])    takes Keppra daily   GERD (gastroesophageal reflux disease)    takes Omeprazole daily   Headache    Hyperlipidemia    takes Atorvastatin daily   Hypertension    takes Lotrel daily   Peripheral edema    takes Lasix daily   Peripheral neuropathy    takes Gabapentin daily   Pneumonia 3 yrs ago   hx of   Seizures ([place])    Sleep apnea    Urinary frequency    Past Surgical History:  Procedure Laterality Date   BIOPSY  09/05/2020   Procedure: BIOPSY;  Surgeon: Irene Shipper, MD;  Location: [facility] ENDOSCOPY;  Service: Endoscopy;;   BIOPSY  02/06/2021   Procedure: BIOPSY;  Surgeon: Jerene Bears, MD;  Location: [place] ENDOSCOPY;  Service: Gastroenterology;;   CARDIAC CATHETERIZATION  7 yrs ago   COLONOSCOPY WITH PROPOFOL N/A 09/05/2020   Procedure: COLONOSCOPY WITH PROPOFOL;  Surgeon: Irene Shipper, MD;  Location: [place];  Service: Endoscopy;  Laterality: N/A;   CRANIOTOMY N/A 09/01/2016   Procedure: CRANIOTOMY TUMOR  LEFT PTERIONAL;  Surgeon: Ashok Pall, MD;  Location: [place];  Service: Neurosurgery;  Laterality: N/A;   cyst removed from chest      as a child   ESOPHAGOGASTRODUODENOSCOPY (EGD) WITH PROPOFOL N/A 02/06/2021   Procedure: ESOPHAGOGASTRODUODENOSCOPY (EGD) WITH PROPOFOL;  Surgeon: Jerene Bears, MD;  Location: [facility] ENDOSCOPY;  Service: Gastroenterology;  Laterality: N/A;   IR FLUORO GUIDE CV LINE LEFT  12/22/2020   IR GASTROSTOMY TUBE MOD SED  02/10/2021   IR REMOVAL TUN CV CATH W/O FL  03/09/2021   IR US GUIDE VASC ACCESS LEFT  12/22/2020   NO PAST SURGERIES     PARTIAL COLECTOMY N/A 09/10/2020   Procedure: TRANSVERSE COLECTOMY;  Surgeon: [name], MD;  Location: [place];   Service: General;  Laterality: N/A;   POLYPECTOMY  09/05/2020   Procedure: POLYPECTOMY;  Surgeon: Irene Shipper, MD;  Location[facility] ENDOSCOPY;  Service: Endoscopy;;   PORTACATH PLACEMENT Right 10/01/2020   Procedure: INSERTION PORT-A-CATH;  Surgeon: [name], MD;  Location: [place];  Service: General;  Laterality: Right;   SUBMUCOSAL TATTOO INJECTION  09/05/2020   Procedure: SUBMUCOSAL TATTOO INJECTION;  Surgeon: Irene Shipper, MD;  Location: [facility] ENDOSCOPY;  Service: Endoscopy;;   TRACHEOSTOMY TUBE PLACEMENT N/A 01/05/2021   Procedure: TRACHEOSTOMY;  Surgeon: [name], MD;  Location: [place];  Service: ENT;  Laterality: N/A;    reports that he has quit smoking. He has never used smokeless tobacco. He reports that he does not drink alcohol and does not use drugs. family history includes Breast cancer in his maternal aunt and mother; Prostate cancer in his maternal uncle. Allergies  Allergen Reactions   Morphine And Related Other (See Comments)    Throat swelling (can tolerate oxycodone/hydrocodone/hydromorphone)      Outpatient Encounter Medications as of 03/18/2021  Medication Sig   acetaminophen (TYLENOL) 325 MG tablet Take 2 tablets (650 mg total) by mouth every 6 (six) hours as needed for mild pain or fever.   albuterol (PROVENTIL HFA;VENTOLIN HFA) 108 (90 BASE) MCG/ACT inhaler Inhale 2 puffs into the lungs every 4 (four) hours as needed for wheezing or shortness of breath.   amLODipine (NORVASC) 10 MG tablet Take 10 mg by mouth daily.   apixaban (ELIQUIS) 5 MG TABS tablet Take 1 tablet (5 mg total) by mouth 2 (two) times daily.   atorvastatin (LIPITOR) 20 MG tablet Take 20 mg by mouth daily at 6 PM.   clonazePAM (KLONOPIN) 0.5 MG tablet Take 1 tablet (0.5 mg total) by mouth 2 (two) times daily.   diclofenac Sodium (VOLTAREN) 1 % GEL Apply 4 g topically 4 (four) times daily.   feeding supplement (ENSURE ENLIVE / ENSURE PLUS) LIQD Take 237 mLs by mouth 3 (three) times daily between  meals.   gabapentin (NEURONTIN) 100 MG capsule Take 1 capsule (100 mg total) by mouth at bedtime.   hydrocerin (EUCERIN) CREA Apply 1 application topically 2 (two) times daily.   insulin aspart (NOVOLOG) 100 UNIT/ML injection Inject 0-15 Units into the skin 3 (three) times daily with meals. Do NOT hold insulin if patient is NPO. Moderate Scale.   levETIRAcetam (KEPPRA) 500 MG tablet Take 1 tablet (500 mg total) by mouth 2 (two) times daily.   loperamide (IMODIUM) 2 MG capsule Take 1 capsule (2 mg total) by mouth as needed for diarrhea or loose stools.   Multiple Vitamin (MULTIVITAMIN WITH MINERALS) TABS tablet Take 1 tablet by mouth daily.   omeprazole (PRILOSEC) 20 MG  capsule Take 20 mg by mouth daily.   ondansetron (ZOFRAN) 8 MG tablet Take 1 tablet (8 mg total) by mouth every 8 (eight) hours as needed for nausea or vomiting.   oxyCODONE (OXY IR/ROXICODONE) 5 MG immediate release tablet Take 0.5 tablets (2.5 mg total) by mouth every 4 (four) hours as needed for severe pain.   [DISCONTINUED] Nutritional Supplements (,FEEDING SUPPLEMENT, PROSOURCE PLUS) liquid Take 30 mLs by mouth 2 (two) times daily between meals.   No facility-administered encounter medications on file as of 03/18/2021.     REVIEW OF SYSTEMS  : All other systems reviewed and negative except where noted in the History of Present Illness.   PHYSICAL EXAM: BP 140/90 (BP Location: Left Arm, Patient Position: Sitting, Cuff Size: Normal)   Pulse (!) 106  General: Well developed AA male in no acute distress; in wheelchair Head: Normocephalic and atraumatic Eyes:  Sclerae anicteric, conjunctiva pink. Ears: Normal auditory acuity Lungs: Clear throughout to auscultation; no W/R/R. Heart: Regular rate and rhythm; no M/R/G. Abdomen: Soft, non-distended.  BS present.  Well-healed laparotomy scar noted.  Brown stool in ostomy bag on the right.  Gastrostomy tube noted.  Non-tender. Musculoskeletal: Symmetrical with no gross  deformities  Skin: No lesions on visible extremities Extremities: No edema  Neurological: Alert oriented x 4, grossly non-focal Psychological:  Alert and cooperative. Normal mood and affect  ASSESSMENT AND PLAN: *Nausea/vomiting with epigastric abdominal pain: Symptoms much improved.  He had grade C esophagitis and duodenal ulcers on EGD.  We recommended 40 mg PPI twice daily for 4 weeks and then 40 mg daily.  He is currently only on omeprazole 20 mg daily.  I am going to increase that to 40 mg.  New prescription was sent with the patient.  Otherwise he is doing well from our standpoint.  He is asking about having the feeding tube removed.  We did not order or place the feeding tube.  He has only been out of the hospital for less than a week.  I told him that he needs to address that further with his PCP and surgery on follow-up.   CC:  [name], NP

## 2021-03-25 ENCOUNTER — Other Ambulatory Visit (HOSPITAL_COMMUNITY): Payer: Self-pay

## 2021-03-25 ENCOUNTER — Telehealth (HOSPITAL_COMMUNITY): Payer: Self-pay

## 2021-03-25 NOTE — Telephone Encounter (Signed)
Transitions of Care Pharmacy   Call attempted for a pharmacy transitions of care follow-up. Unable to leave voicemail.  Call attempt #1. Will follow-up in 2-3 days.    

## 2021-03-26 ENCOUNTER — Other Ambulatory Visit (HOSPITAL_COMMUNITY): Payer: Self-pay

## 2021-03-26 ENCOUNTER — Telehealth (HOSPITAL_COMMUNITY): Payer: Self-pay | Admitting: Pharmacist

## 2021-03-26 NOTE — Telephone Encounter (Signed)
Transitions of Care Pharmacy   Call attempted for a pharmacy transitions of care follow-up. No voicemail available on home phone or mobile phone (VM full).   Call attempt #2. Will follow-up in 1-3 days.

## 2021-04-07 ENCOUNTER — Telehealth (HOSPITAL_COMMUNITY): Payer: Self-pay

## 2021-04-07 NOTE — Telephone Encounter (Signed)
Transitions of Care Pharmacy   Call attempted for a pharmacy transitions of care follow-up. Unable tot leave voicemail.  Call attempt #3. Will no longer attempt follow up for [facility] pharmacy.

## 2021-04-10 ENCOUNTER — Other Ambulatory Visit (HOSPITAL_COMMUNITY): Payer: Self-pay | Admitting: Interventional Radiology

## 2021-04-10 DIAGNOSIS — R633 Feeding difficulties, unspecified: Secondary | ICD-10-CM

## 2021-04-14 ENCOUNTER — Inpatient Hospital Stay: Payer: Medicare HMO | Attending: Oncology | Admitting: Oncology

## 2021-04-14 ENCOUNTER — Other Ambulatory Visit: Payer: Self-pay | Admitting: *Deleted

## 2021-04-14 ENCOUNTER — Other Ambulatory Visit: Payer: Self-pay

## 2021-04-14 VITALS — BP 123/84 | HR 102 | Temp 98.1°F | Resp 18 | Ht 68.0 in | Wt 236.0 lb

## 2021-04-14 DIAGNOSIS — I10 Essential (primary) hypertension: Secondary | ICD-10-CM | POA: Insufficient documentation

## 2021-04-14 DIAGNOSIS — Z931 Gastrostomy status: Secondary | ICD-10-CM | POA: Insufficient documentation

## 2021-04-14 DIAGNOSIS — Z8042 Family history of malignant neoplasm of prostate: Secondary | ICD-10-CM | POA: Insufficient documentation

## 2021-04-14 DIAGNOSIS — C184 Malignant neoplasm of transverse colon: Secondary | ICD-10-CM

## 2021-04-14 DIAGNOSIS — E1122 Type 2 diabetes mellitus with diabetic chronic kidney disease: Secondary | ICD-10-CM | POA: Insufficient documentation

## 2021-04-14 DIAGNOSIS — Z933 Colostomy status: Secondary | ICD-10-CM | POA: Diagnosis not present

## 2021-04-14 DIAGNOSIS — I129 Hypertensive chronic kidney disease with stage 1 through stage 4 chronic kidney disease, or unspecified chronic kidney disease: Secondary | ICD-10-CM | POA: Diagnosis not present

## 2021-04-14 DIAGNOSIS — G40909 Epilepsy, unspecified, not intractable, without status epilepticus: Secondary | ICD-10-CM | POA: Insufficient documentation

## 2021-04-14 DIAGNOSIS — N189 Chronic kidney disease, unspecified: Secondary | ICD-10-CM | POA: Insufficient documentation

## 2021-04-14 DIAGNOSIS — I82401 Acute embolism and thrombosis of unspecified deep veins of right lower extremity: Secondary | ICD-10-CM | POA: Insufficient documentation

## 2021-04-14 DIAGNOSIS — Z7901 Long term (current) use of anticoagulants: Secondary | ICD-10-CM | POA: Insufficient documentation

## 2021-04-14 DIAGNOSIS — E1140 Type 2 diabetes mellitus with diabetic neuropathy, unspecified: Secondary | ICD-10-CM | POA: Diagnosis not present

## 2021-04-14 DIAGNOSIS — Z930 Tracheostomy status: Secondary | ICD-10-CM | POA: Diagnosis not present

## 2021-04-14 LAB — CBC WITH DIFFERENTIAL (CANCER CENTER ONLY)
Abs Immature Granulocytes: 0.07 10*3/uL (ref 0.00–0.07)
Basophils Absolute: 0.1 10*3/uL (ref 0.0–0.1)
Basophils Relative: 1 %
Eosinophils Absolute: 0.3 10*3/uL (ref 0.0–0.5)
Eosinophils Relative: 3 %
HCT: 30.8 % — ABNORMAL LOW (ref 39.0–52.0)
Hemoglobin: 10.3 g/dL — ABNORMAL LOW (ref 13.0–17.0)
Immature Granulocytes: 1 %
Lymphocytes Relative: 21 %
Lymphs Abs: 2.7 10*3/uL (ref 0.7–4.0)
MCH: 26.4 pg (ref 26.0–34.0)
MCHC: 33.4 g/dL (ref 30.0–36.0)
MCV: 79 fL — ABNORMAL LOW (ref 80.0–100.0)
Monocytes Absolute: 1 10*3/uL (ref 0.1–1.0)
Monocytes Relative: 8 %
Neutro Abs: 8.8 10*3/uL — ABNORMAL HIGH (ref 1.7–7.7)
Neutrophils Relative %: 66 %
Platelet Count: 353 10*3/uL (ref 150–400)
RBC: 3.9 MIL/uL — ABNORMAL LOW (ref 4.22–5.81)
RDW: 14.6 % (ref 11.5–15.5)
WBC Count: 13.1 10*3/uL — ABNORMAL HIGH (ref 4.0–10.5)
nRBC: 0 % (ref 0.0–0.2)

## 2021-04-14 LAB — CMP (CANCER CENTER ONLY)
ALT: 14 U/L (ref 0–44)
AST: 13 U/L — ABNORMAL LOW (ref 15–41)
Albumin: 3.9 g/dL (ref 3.5–5.0)
Alkaline Phosphatase: 88 U/L (ref 38–126)
Anion gap: 10 (ref 5–15)
BUN: 36 mg/dL — ABNORMAL HIGH (ref 8–23)
CO2: 22 mmol/L (ref 22–32)
Calcium: 9.8 mg/dL (ref 8.9–10.3)
Chloride: 106 mmol/L (ref 98–111)
Creatinine: 1.93 mg/dL — ABNORMAL HIGH (ref 0.61–1.24)
GFR, Estimated: 38 mL/min — ABNORMAL LOW (ref >60)
Glucose, Bld: 108 mg/dL — ABNORMAL HIGH (ref 70–99)
Potassium: 4.1 mmol/L (ref 3.5–5.1)
Sodium: 138 mmol/L (ref 135–145)
Total Bilirubin: 0.4 mg/dL (ref 0.3–1.2)
Total Protein: 7.4 g/dL (ref 6.5–8.1)

## 2021-04-14 LAB — CEA (ACCESS): CEA (CHCC): 1.8 ng/mL (ref 0.00–5.00)

## 2021-04-14 MED ORDER — SODIUM CHLORIDE 0.9% FLUSH
10.0000 mL | Freq: Once | INTRAVENOUS | Status: AC
  Administered 2021-04-14: 10 mL via INTRAVENOUS

## 2021-04-14 MED ORDER — HEPARIN SOD (PORK) LOCK FLUSH 100 UNIT/ML IV SOLN
500.0000 [IU] | Freq: Once | INTRAVENOUS | Status: AC
  Administered 2021-04-14: 500 [IU] via INTRAVENOUS

## 2021-04-14 NOTE — Progress Notes (Signed)
[facility] OFFICE PROGRESS NOTE   Diagnosis: Colon cancer  INTERVAL HISTORY:   Mr. Kulpa returns for an office visit after discharge from the hospital on 03/12/2021.  He was admitted 11/30/2020 and had a length the hospital admission with a diabetic hyper osmolar nonketotic state and acute renal failure.  He developed multiple complications while in the hospital including acute renal failure requiring hemodialysis, respiratory failure requiring intubation, tracheostomy, and placement of a gastrostomy feeding tube.  He was also diagnosed with strokes, ulcer disease, and sepsis while in the hospital.  He had hand/foot syndrome secondary to capecitabine.  Mr. Meyn now resides in a rehabilitation facility.  He is performing occupational therapy and physical therapy.  He reports making steady progress.  He plans to go home within the next 2 weeks.  The feeding tube is scheduled to be removed later this week.  He is here today with his wife.  She confirms the progress he is making.  He reports adequate food intake by mouth.  He continues to have darkening of the nailbeds and peeling of skin at the feet.  Objective:  Vital signs in last 24 hours:  Blood pressure 123/84, pulse (!) 102, temperature 98.1 F (36.7 C), temperature source Oral, resp. rate 18, height 5' 8"  (1.727 m), weight 236 lb (107 kg), SpO2 100 %.    HEENT: No thrush or ulcers Lymphatics: No cervical, supraclavicular, axillary, or inguinal nodes Resp: Lungs clear bilaterally Cardio: Regular rate and rhythm GI: No hepatosplenomegaly, right abdomen colostomy with brown stool Vascular: No leg edema Neuro: Alert and oriented, follows commands, ambulated to the exam table with assistance Skin: Hyperpigmentation of the nailbeds, palms without skin breakdown, areas of scabbing and superficial desquamation of the soles-appear to be healing  Portacath/PICC-without erythema  Lab Results:  Lab Results  Component Value  Date   WBC 10.8 (H) 03/11/2021   HGB 8.7 (L) 03/11/2021   HCT 25.8 (L) 03/11/2021   MCV 81.6 03/11/2021   PLT 362 03/11/2021   NEUTROABS 10.9 (H) 02/27/2021    CMP  Lab Results  Component Value Date   NA 140 03/13/2021   K 4.3 03/13/2021   CL 109 03/13/2021   CO2 25 03/13/2021   GLUCOSE 112 (H) 03/13/2021   BUN 41 (H) 03/13/2021   CREATININE 2.62 (H) 03/13/2021   CALCIUM 9.1 03/13/2021   PROT 8.0 02/26/2021   ALBUMIN 2.5 (L) 03/13/2021   AST 22 02/26/2021   ALT 22 02/26/2021   ALKPHOS 97 02/26/2021   BILITOT 0.9 02/26/2021   GFRNONAA 26 (L) 03/13/2021   GFRAA >60 04/01/2017    Lab Results  Component Value Date   CEA1 1.7 09/05/2020   CEA <1.00 10/08/2020    Lab Results  Component Value Date   INR 1.1 02/10/2021   LABPROT 14.4 02/10/2021    Imaging:  No results found.  Medications: I have reviewed the patient's current medications.   Assessment/Plan: Transverse colon cancer, stage IIIb (T3N1a), status post a partial transverse colectomy and end colostomy 09/10/2020 CT abdomen/pelvis 09/04/2020- possible transverse colon mass with small regional lymph nodes Colonoscopy 09/05/2020- obstructing transverse colon mass-biopsy invasive adenocarcinoma, mass could not be passed CT chest 09/09/2020-no evidence of metastatic disease, 3 x 2 mm subpleural nodule in the right upper lobe likely benign subpleural lymph node Partial transverse colectomy 09/10/2020, transverse colon tumor, no lymphovascular perineural invasion, 1/19 lymph nodes positive, negative margins, MSI stable, no loss of mismatch repair protein expression Cycle 1 CAPOX 10/08/2020, oxaliplatin  dose reduced to 100 mg per metered squared secondary to renal insufficiency Cycle 2 CAPOX 10/29/2020 Cycle 3 CAPOX 11/19/2020, capecitabine dose reduced secondary to hand/foot syndrome, oxaliplatin dose reduced secondary to renal failure and poor performance status CT chest/abdomen/pelvis without contrast 12/01/2020-no acute  abnormality in the chest, abdomen, or pelvis.  No evidence of obstruction. Resection of a frontal meningioma 09/01/2016 Diabetes Rectal polyp- tubular adenoma on colonoscopy 09/05/2020 Hypertension Diabetic neuropathy Epilepsy Family history of breast and prostate cancer Admission with acute renal failure 10/01/2020-secondary to lack of oral intake and lisinopril, improved with intravenous hydration and holding lisinopril Admission 12/01/2020-sepsis, diarrhea HHS, A. fib with RVR, abdominal pain 11.  Respiratory failure requiring intubation 12/06/2020 Tracheostomy 12/18/2020 12.  Progressive renal failure-CRRT started 12/06/2020, discontinued after 12/16/2020, hemodialysis initiated 13.  Thrombocytopenia secondary to chemotherapy and sepsis, heparin-induced platelet antibody and SRA negative-resolved 14.  Right lower extremity DVT diagnosed 12/12/2020-on apixaban  15.  Anemia secondary to chronic disease, renal failure, and phlebotomy      Disposition: Mr. Liby is recovering after a prolonged hospital admission after presenting with a diabetic hyperosmolar nonketotic state, sepsis, and renal failure.  He had multiple medical complications while in the hospital including strokes, renal failure requiring hemodialysis, and respiratory failure requiring a tracheostomy.  A gastric feeding tube remains in place.  Mr. Leite appears to be making great progress following discharge from the hospital.  He hopes to return home within the next few weeks.  The feeding tube is scheduled to be removed.  He is in clinical remission from colon cancer.  He will return to the lab for a CBC, chemistry panel, and CEA today.  He will return for an office visit and Port-A-Cath flush in 6 weeks.  I recommend the nursing facility providers continue to apply moisturizers to the hands and feet.  He plans to discuss the possibility of future ostomy reversal with Dr. Bobbye Morton.  Betsy Coder, MD  04/14/2021  12:06 PM

## 2021-04-15 ENCOUNTER — Ambulatory Visit (HOSPITAL_COMMUNITY)
Admission: RE | Admit: 2021-04-15 | Discharge: 2021-04-15 | Disposition: A | Discharge status: Home / Self Care | Payer: Medicare HMO | Source: Ambulatory Visit | Attending: Interventional Radiology | Admitting: Interventional Radiology

## 2021-04-15 ENCOUNTER — Telehealth: Payer: Self-pay

## 2021-04-15 DIAGNOSIS — Z431 Encounter for attention to gastrostomy: Secondary | ICD-10-CM | POA: Diagnosis present

## 2021-04-15 DIAGNOSIS — R633 Feeding difficulties, unspecified: Secondary | ICD-10-CM | POA: Diagnosis not present

## 2021-04-15 HISTORY — PX: IR GASTROSTOMY TUBE REMOVAL: IMG5492

## 2021-04-15 MED ORDER — LIDOCAINE VISCOUS HCL 2 % MT SOLN
OROMUCOSAL | Status: AC
  Administered 2021-04-15: 5 mL
  Filled 2021-04-15: qty 15

## 2021-04-15 NOTE — Procedures (Addendum)
Pt's 48f pull thru gastrostomy tube was removed in its entirety without immediate complications. Gauze dressing was applied to site. EBL < 2 cc. Medication used-viscous lidocaine to G tube insertion site tract.

## 2021-04-15 NOTE — Telephone Encounter (Addendum)
TC to Pt spoke with Pt's wife Jerlyn Ly who stated she will relay the message to Pt. Pt's wife verbalized understanding. Lab's forwarded to PCP

## 2021-04-15 NOTE — Telephone Encounter (Signed)
-----   Message from Ladell Pier, MD sent at 04/14/2021  4:00 PM EST ----- Please call patient, the CEA is normal, kidney function looks better, copy labs to his primary provider, follow-up as scheduled

## 2021-04-23 ENCOUNTER — Inpatient Hospital Stay: Payer: Self-pay | Admitting: Adult Health

## 2021-04-28 ENCOUNTER — Encounter: Payer: Self-pay | Admitting: Adult Health

## 2021-04-28 ENCOUNTER — Ambulatory Visit: Payer: Medicare HMO | Admitting: Adult Health

## 2021-04-28 VITALS — BP 124/82 | HR 99

## 2021-04-28 DIAGNOSIS — I480 Paroxysmal atrial fibrillation: Secondary | ICD-10-CM

## 2021-04-28 DIAGNOSIS — I10 Essential (primary) hypertension: Secondary | ICD-10-CM

## 2021-04-28 DIAGNOSIS — E1142 Type 2 diabetes mellitus with diabetic polyneuropathy: Secondary | ICD-10-CM

## 2021-04-28 DIAGNOSIS — I63423 Cerebral infarction due to embolism of bilateral anterior cerebral arteries: Secondary | ICD-10-CM

## 2021-04-28 DIAGNOSIS — E785 Hyperlipidemia, unspecified: Secondary | ICD-10-CM | POA: Diagnosis not present

## 2021-04-28 NOTE — Patient Instructions (Signed)
Continue Eliquis (apixaban) daily  and atorvastatin 20mg  daily  for secondary stroke prevention  You will be called to schedule a repeat MRI of your brain with and without contrast  Continue to follow up with PCP regarding cholesterol, blood pressure and diabetes management  Maintain strict control of hypertension with blood pressure goal below 130/90, diabetes with hemoglobin A1c goal below 7.0 % and cholesterol with LDL cholesterol (bad cholesterol) goal below 70 mg/dL.   Signs of a Stroke? Follow the BEFAST method:  Balance Watch for a sudden loss of balance, trouble with coordination or vertigo Eyes Is there a sudden loss of vision in one or both eyes? Or double vision?  Face: Ask the person to smile. Does one side of the face droop or is it numb?  Arms: Ask the person to raise both arms. Does one arm drift downward? Is there weakness or numbness of a leg? Speech: Ask the person to repeat a simple phrase. Does the speech sound slurred/strange? Is the person confused ? Time: If you observe any of these signs, call 911.    Continue to follow with [facility] for both seizure and stroke     Thank you for coming to see [place] at [facility]. I hope we have been able to provide you high quality care today.  You may receive a patient satisfaction survey over the next few weeks. We would appreciate your feedback and comments so that we may continue to improve ourselves and the health of our patients.

## 2021-04-28 NOTE — Progress Notes (Signed)
[facility] [address] [place]. [place] [address] [phone]       HOSPITAL FOLLOW UP NOTE  [name] Date of Birth:  06-28-1955 Medical Record Number:  [id number]   Reason for Referral:  hospital stroke follow up    SUBJECTIVE:   CHIEF COMPLAINT:  Chief Complaint  Patient presents with   Follow-up    Rm 3 with wife here for hospital follow up. Reports he has been doing well was d/c from [facility] yesterday.     HPI:   Thomas [name] is a 65 y.o. male with a medical history significant for essential hypertension, hyperlipidemia, epilepsy on Keppra, type 2 diabetes mellitus with peripheral neuropathy, frontal meningioma s/p craniotomy 2018, GERD, MCA stroke, stage 3 colon cancer s/p partial colectomy and end colostomy on 09/10/2020, and chemotherapy with 3rd cycle in July 2022 who presented to the ED 7/24 for evaluation of abdominal pain with fever, tachycardia, tachypnea, elevated lactate was found to be septic.  While in the ED patient developed atrial fibrillation with RVR and was admitted to the ICU for septic shock likely related to cepacitabine toxicity with oral mucositis and hand/foot syndrome. Ongoing tongue paresis affecting his ability to speak and swallow.  Prolonged hospitalization with multiple complications including aspiration pneumonia with eventual tracheostomy, acute RLE DVT placed on Eliquis, anemia and thrombocytopenia, acute renal failure requiring short-term hemodialysis, severe peripheral neuropathy, urinary retention and physical deconditioning. Neurology consulted on 9/30 due to continued tongue paresis and dysphagia requiring PEG tube.  MRI showed tiny bilateral punctate embolic infarcts although noted possible small metastatic disease and recommended follow-up MRI in 6 to 8 weeks.  Likely incidental finding and tongue weakness not related. EF 60 to 65% with moderate calcific aortic stenosis.  LDL 67.  A1c 6.4 recommended  continuation of Eliquis and atorvastatin 20 mg daily.  Stroke etiology ddx including hypoperfusion, A. fib RVR vs paradoxical emboli given acute DVT. TCD negative for PFO. Noted significant improvements in mental status, speech and swallowing with improvement of tongue mobility prior to discharge.  He was decannulated without issues.  He was eventually discharged to SNF on 03/12/2021 after an 103 day stay.    Today, 04/28/2021, patient being seen for initial hospital stroke follow-up accompanied by his wife. He was discharged yesterday from [facility] to home. He has been making great recovery since hospital discharge. He plans on starting home health therapy.  Able to ambulate short distance with RW -denies any recent falls. Denies any stroke/TIA symptoms.  Denies any continued swallowing or speech difficulties.  Compliant on Eliquis and atorvastatin 20 mg daily -denies side effects.  Blood pressure today 124/82. Glucose levels typically 110-130s. He is scheduled to f/u with PCP next week. He has since had f/u with oncology and currently in clinical remission from colon cancer. PEG tube removed 12/7.  Remains on Keppra 500 mg twice daily for seizure prevention but questions possible nocturnal seizures. Followed by [facility] for seizure management - was seen just prior to hospitalization.  No further concerns at this time     PERTINENT IMAGING  Per hospitalization MRI brain  - Multiple punctate foci of restricted diffusion in bilateral frontal white matter, most likely acute infarcts.  MRI with contrast Enhancement of 2 of the foci of restricted diffusion described on prior MRI. Differential diagnosis remains the same including infarct versus small metastatic disease given the acute/subacute infarct may show contrast enhancement. A 6-8 week follow-up MRI with and without  contrast is recommended. TCD bubble study no PFO Carotid Doppler unremarkable 2D Echo 12/02/2020 EF 60 to 65%  LE  venous doppler right DVT  LDL 67 HgbA1c 6.4    ROS:   14 system review of systems performed and negative with exception of those listed in HPI  PMH:  Past Medical History:  Diagnosis Date   Acute ischemic left MCA stroke ([place]) 04/09/2014   Acute respiratory failure with hypoxia ([place]) 04/09/2014   Aspiration pneumonia ([place])    Asthma    as a child   Brain tumor ([place])    frontal   Colon cancer ([place])    Confusion    occasionally   Diabetes mellitus without complication ([place])    takes Metformin daily.   Dizziness    Dyspnea    pt states d/t weight   Epilepsy ([place])    takes Keppra daily   GERD (gastroesophageal reflux disease)    takes Omeprazole daily   Headache    Hyperlipidemia    takes Atorvastatin daily   Hypertension    takes Lotrel daily   Peripheral edema    takes Lasix daily   Peripheral neuropathy    takes Gabapentin daily   Pneumonia 3 yrs ago   hx of   Seizures ([place])    Sleep apnea    Urinary frequency     PSH:  Past Surgical History:  Procedure Laterality Date   BIOPSY  09/05/2020   Procedure: BIOPSY;  Surgeon: Irene Shipper, MD;  Location: [facility] ENDOSCOPY;  Service: Endoscopy;;   BIOPSY  02/06/2021   Procedure: BIOPSY;  Surgeon: Jerene Bears, MD;  Location: [facility] ENDOSCOPY;  Service: Gastroenterology;;   CARDIAC CATHETERIZATION  7 yrs ago   COLONOSCOPY WITH PROPOFOL N/A 09/05/2020   Procedure: COLONOSCOPY WITH PROPOFOL;  Surgeon: Irene Shipper, MD;  Location: [facility];  Service: Endoscopy;  Laterality: N/A;   CRANIOTOMY N/A 09/01/2016   Procedure: CRANIOTOMY TUMOR  LEFT PTERIONAL;  Surgeon: Ashok Pall, MD;  Location: [facility];  Service: Neurosurgery;  Laterality: N/A;   cyst removed from chest      as a child   ESOPHAGOGASTRODUODENOSCOPY (EGD) WITH PROPOFOL N/A 02/06/2021   Procedure: ESOPHAGOGASTRODUODENOSCOPY (EGD) WITH PROPOFOL;  Surgeon: Jerene Bears, MD;  Location: [facility] ENDOSCOPY;  Service: Gastroenterology;  Laterality: N/A;   IR FLUORO GUIDE CV  LINE LEFT  12/22/2020   IR GASTROSTOMY TUBE MOD SED  02/10/2021   IR GASTROSTOMY TUBE REMOVAL  04/15/2021   IR REMOVAL TUN CV CATH W/O FL  03/09/2021   IR US GUIDE VASC ACCESS LEFT  12/22/2020   NO PAST SURGERIES     PARTIAL COLECTOMY N/A 09/10/2020   Procedure: TRANSVERSE COLECTOMY;  Surgeon: [name], MD;  Location: [facility];  Service: General;  Laterality: N/A;   POLYPECTOMY  09/05/2020   Procedure: POLYPECTOMY;  Surgeon: Irene Shipper, MD;  Location: [facility];  Service: Endoscopy;;   PORTACATH PLACEMENT Right 10/01/2020   Procedure: INSERTION PORT-A-CATH;  Surgeon: [name], MD;  Location: [facility];  Service: General;  Laterality: Right;   SUBMUCOSAL TATTOO INJECTION  09/05/2020   Procedure: SUBMUCOSAL TATTOO INJECTION;  Surgeon: Irene Shipper, MD;  Location: [facility] ENDOSCOPY;  Service: Endoscopy;;   TRACHEOSTOMY TUBE PLACEMENT N/A 01/05/2021   Procedure: TRACHEOSTOMY;  Surgeon: [name], MD;  Location: [facility] OR;  Service: ENT;  Laterality: N/A;    Social History:  Social History   Socioeconomic History   Marital status: Married  Spouse name: Mahalia   Number of children: 4   Years of education: Not on file   Highest education level: Not on file  Occupational History   Occupation: Disabled  Tobacco Use   Smoking status: Former   Smokeless tobacco: Never   Tobacco comments:    quit smoking 35 yrs ago  Vaping Use   Vaping Use: Never used  Substance and Sexual Activity   Alcohol use: No   Drug use: No   Sexual activity: Not on file  Other Topics Concern   Not on file  Social History Narrative   Married to his wife, Jerlyn Ly with total of #4 children (#2 with current wife). Lives in home with his son and daughter-in-law.     Social Determinants of Health   Financial Resource Strain: Not on file  Food Insecurity: Not on file  Transportation Needs: Not on file  Physical Activity: Not on file  Stress: Not on file  Social Connections: Not on file  Intimate Partner  Violence: Not on file    Family History:  Family History  Problem Relation Age of Onset   Breast cancer Mother    Breast cancer Maternal Aunt    Prostate cancer Maternal Uncle     Medications:   Current Outpatient Medications on File Prior to Visit  Medication Sig Dispense Refill   acetaminophen (TYLENOL) 325 MG tablet Take 2 tablets (650 mg total) by mouth every 6 (six) hours as needed for mild pain or fever.     albuterol (PROVENTIL HFA;VENTOLIN HFA) 108 (90 BASE) MCG/ACT inhaler Inhale 2 puffs into the lungs every 4 (four) hours as needed for wheezing or shortness of breath. 1 Inhaler 1   amLODipine (NORVASC) 10 MG tablet Take 10 mg by mouth daily.     apixaban (ELIQUIS) 5 MG TABS tablet Take 1 tablet (5 mg total) by mouth 2 (two) times daily. 60 tablet 6   atorvastatin (LIPITOR) 20 MG tablet Take 20 mg by mouth daily at 6 PM.     clonazePAM (KLONOPIN) 0.5 MG tablet Take 1 tablet (0.5 mg total) by mouth 2 (two) times daily. 30 tablet 0   diclofenac Sodium (VOLTAREN) 1 % GEL Apply 4 g topically 4 (four) times daily.     feeding supplement (ENSURE ENLIVE / ENSURE PLUS) LIQD Take 237 mLs by mouth 3 (three) times daily between meals. 237 mL 12   gabapentin (NEURONTIN) 100 MG capsule Take 1 capsule (100 mg total) by mouth at bedtime.     hydrocerin (EUCERIN) CREA Apply 1 application topically 2 (two) times daily.  0   insulin aspart (NOVOLOG) 100 UNIT/ML injection Inject 0-15 Units into the skin 3 (three) times daily with meals. Do NOT hold insulin if patient is NPO. Moderate Scale. 10 mL 11   levETIRAcetam (KEPPRA) 500 MG tablet Take 1 tablet (500 mg total) by mouth 2 (two) times daily.     loperamide (IMODIUM) 2 MG capsule Take 1 capsule (2 mg total) by mouth as needed for diarrhea or loose stools. 30 capsule 0   Multiple Vitamin (MULTIVITAMIN WITH MINERALS) TABS tablet Take 1 tablet by mouth daily.     omeprazole (PRILOSEC) 40 MG capsule Take 1 capsule (40 mg total) by mouth daily. 90  capsule 1   ondansetron (ZOFRAN) 8 MG tablet Take 1 tablet (8 mg total) by mouth every 8 (eight) hours as needed for nausea or vomiting. 30 tablet 1   oxyCODONE (OXY IR/ROXICODONE) 5 MG immediate release tablet Take  0.5 tablets (2.5 mg total) by mouth every 4 (four) hours as needed for severe pain. 30 tablet 0   No current facility-administered medications on file prior to visit.    Allergies:   Allergies  Allergen Reactions   Morphine And Related Other (See Comments)    Throat swelling (can tolerate oxycodone/hydrocodone/hydromorphone)      OBJECTIVE:  Physical Exam  Vitals:   04/28/21 1510  BP: 124/82  Pulse: 99  SpO2: 94%   There is no height or weight on file to calculate BMI. No results found.  Post stroke PHQ 2/9 Depression screen PHQ 2/9 04/28/2021  Decreased Interest 0  Down, Depressed, Hopeless 0  PHQ - 2 Score 0  Some encounter information is confidential and restricted. Go to Review Flowsheets activity to see all data.     General: well developed, well nourished, very pleasant middle-aged African-American male, seated, in no evident distress Head: head normocephalic and atraumatic.   Neck: supple with no carotid or supraclavicular bruits Cardiovascular: regular rate and rhythm, no murmurs Musculoskeletal: no deformity Skin:  no rash/petichiae Vascular:  Normal pulses all extremities   Neurologic Exam Mental Status: Awake and fully alert.  Fluent speech and language.  Oriented to place and time. Recent and remote memory intact. Attention span, concentration and fund of knowledge appropriate. Mood and affect appropriate.  Cranial Nerves: Fundoscopic exam reveals sharp disc margins. Pupils equal, briskly reactive to light. Extraocular movements full without nystagmus. Visual fields full to confrontation. Hearing intact. Facial sensation intact. Face, tongue, palate moves normally and symmetrically.  Motor: Normal bulk and tone. Normal strength in all tested  extremity muscles except mild b/l HF weakness and chronic right foot drop Sensory.: intact to touch , pinprick , position and vibratory sensation.  Coordination: Rapid alternating movements normal in all extremities. Finger-to-nose performed accurately bilaterally and heel-to-shin performed accurately LLE and difficulty performing RLE  Gait and Station: Deferred as RW not present Reflexes: 1+ and symmetric. Toes downgoing.     NIHSS  0 Modified Rankin  0      ASSESSMENT: CHRISTINE SCHIEFELBEIN is a 65 y.o. year old male with recent b/l frontal watershed stroke in 01/2021 likely in setting of new onset A. Fib likely incidental finding as no pertaining neurological deficits although unable to completely rule out metastatic disease on imaging.  Found during prolonged 103 day hospital stay for likely capecitabine toxicity with oral mucositis and hand/foot syndrome with extensive complications.  Vascular risk factors include new onset A. fib, acute RLE DVT, HTN, HLD, DM, frontal meningioma s/p craniotomy 2018, epilepsy on Keppra, history of prior stroke and stage III colon cancer s/p partial colectomy and end colostomy in 09/2020 and chemotherapy completing third cycle prior to admission.      PLAN:  Recent watershed stroke:  No residual deficit observed.  General deconditioning and BLE weakness from prolonged hospitalization making excellent progress -plans to start home health therapies soon Continue Eliquis (apixaban) daily  and atorvastatin 10 mg daily for secondary stroke prevention.   Discussed secondary stroke prevention measures and importance of close PCP follow up for aggressive stroke risk factor management. I have gone over the pathophysiology of stroke, warning signs and symptoms, risk factors and their management in some detail with instructions to go to the closest emergency room for symptoms of concern. Will repeat MR brain w/wo contrast as requested as unable to completely rule out  metastatic disease especially in setting of colon cancer. Recent eGFR 38 Atrial fibrillation: On Eliquis  5 mg twice daily. Advised to f/u with PCP to discuss need of cardiologist Epilepsy: Continue Keppra 500 mg twice daily managed by [facility]. Advised to further discuss nocturnal seizure concerns with their office HTN: BP goal <130/90.  Stable on current regimen per PCP HLD: LDL goal <70. Recent LDL 67 on atorvastatin 20 mg daily per PCP DMII: A1c goal<7.0. Recent A1c 6.4 managed by PCP.     As currently being followed by [facility] and stable from stroke standpoint, can continue to follow with [facility] and f/u here as needed   CC:  [place] provider: Dr. Leonie Man PCP: [name], NP    I spent 59 minutes of face-to-face and non-face-to-face time with patient and wife.  This included previsit chart review including review of recent prolonged hospitalization, lab review, study review, order entry, electronic health record documentation, patient and wife education regarding recent stroke including etiology, secondary stroke prevention measures and importance of managing stroke risk factors, indication for repeat imaging and answered all other questions to patient and wife's satisfaction  Frann Rider, AGNP-BC  [facility] [address] [place], [place] [address]  Phone [phone] Fax [phone] Note: This document was prepared with digital dictation and possible smart phrase technology. Any transcriptional errors that result from this process are unintentional.

## 2021-04-29 NOTE — Progress Notes (Signed)
I agree with the above plan 

## 2021-04-30 ENCOUNTER — Other Ambulatory Visit: Payer: Self-pay

## 2021-04-30 DIAGNOSIS — C184 Malignant neoplasm of transverse colon: Secondary | ICD-10-CM

## 2021-05-04 ENCOUNTER — Inpatient Hospital Stay: Admission: RE | Admit: 2021-05-04 | Payer: Medicare HMO | Source: Ambulatory Visit

## 2021-05-13 ENCOUNTER — Other Ambulatory Visit: Payer: Self-pay

## 2021-05-13 ENCOUNTER — Ambulatory Visit
Admission: RE | Admit: 2021-05-13 | Discharge: 2021-05-13 | Disposition: A | Discharge status: Home / Self Care | Payer: Medicare HMO | Source: Ambulatory Visit | Attending: Adult Health | Admitting: Adult Health

## 2021-05-13 DIAGNOSIS — I63423 Cerebral infarction due to embolism of bilateral anterior cerebral arteries: Secondary | ICD-10-CM

## 2021-05-13 DIAGNOSIS — C184 Malignant neoplasm of transverse colon: Secondary | ICD-10-CM

## 2021-05-13 MED ORDER — GADOBENATE DIMEGLUMINE 529 MG/ML IV SOLN
20.0000 mL | Freq: Once | INTRAVENOUS | Status: AC | PRN
  Administered 2021-05-13: 20 mL via INTRAVENOUS

## 2021-05-13 MED ORDER — HEPARIN SOD (PORK) LOCK FLUSH 100 UNIT/ML IV SOLN
500.0000 [IU] | Freq: Once | INTRAVENOUS | Status: AC
  Administered 2021-05-13: 500 [IU] via INTRAVENOUS

## 2021-05-13 MED ORDER — SODIUM CHLORIDE 0.9% FLUSH
10.0000 mL | INTRAVENOUS | Status: DC | PRN
  Administered 2021-05-13: 10 mL via INTRAVENOUS

## 2021-05-19 ENCOUNTER — Inpatient Hospital Stay: Payer: Medicare HMO | Attending: Oncology | Admitting: Oncology

## 2021-05-19 ENCOUNTER — Other Ambulatory Visit: Payer: Self-pay

## 2021-05-19 ENCOUNTER — Inpatient Hospital Stay: Payer: Medicare HMO

## 2021-05-19 VITALS — BP 148/77 | HR 99 | Temp 97.8°F | Resp 18 | Ht 68.0 in | Wt 230.0 lb

## 2021-05-19 DIAGNOSIS — I10 Essential (primary) hypertension: Secondary | ICD-10-CM | POA: Insufficient documentation

## 2021-05-19 DIAGNOSIS — C184 Malignant neoplasm of transverse colon: Secondary | ICD-10-CM | POA: Diagnosis not present

## 2021-05-19 DIAGNOSIS — Z9221 Personal history of antineoplastic chemotherapy: Secondary | ICD-10-CM | POA: Insufficient documentation

## 2021-05-19 DIAGNOSIS — I4891 Unspecified atrial fibrillation: Secondary | ICD-10-CM | POA: Insufficient documentation

## 2021-05-19 DIAGNOSIS — Z85038 Personal history of other malignant neoplasm of large intestine: Secondary | ICD-10-CM | POA: Diagnosis present

## 2021-05-19 DIAGNOSIS — Z7901 Long term (current) use of anticoagulants: Secondary | ICD-10-CM | POA: Diagnosis not present

## 2021-05-19 DIAGNOSIS — Z86718 Personal history of other venous thrombosis and embolism: Secondary | ICD-10-CM | POA: Diagnosis not present

## 2021-05-19 DIAGNOSIS — Z79899 Other long term (current) drug therapy: Secondary | ICD-10-CM | POA: Diagnosis not present

## 2021-05-19 MED ORDER — SODIUM CHLORIDE 0.9% FLUSH
10.0000 mL | Freq: Once | INTRAVENOUS | Status: AC
  Administered 2021-05-19: 10 mL via INTRAVENOUS

## 2021-05-19 MED ORDER — HEPARIN SOD (PORK) LOCK FLUSH 100 UNIT/ML IV SOLN
500.0000 [IU] | Freq: Once | INTRAVENOUS | Status: AC
  Administered 2021-05-19: 500 [IU] via INTRAVENOUS

## 2021-05-19 NOTE — Progress Notes (Signed)
[facility] OFFICE PROGRESS NOTE   Diagnosis: Colon cancer  INTERVAL HISTORY:   Thomas [name] returns as scheduled.  He is now living at home.  The feeding tube has been removed.  He continues home physical therapy.  He is ambulatory.  Physical therapy has been reduced from 3-1 time per week.  He had a follow-up brain MRI on 05/13/2021.  There were changes of chronic microvascular ischemia and a small remote cerebellar infarct. The colostomy is functioning well.  He has a good appetite.  Objective:  Vital signs in last 24 hours:  Blood pressure (!) 148/77, pulse 99, temperature 97.8 F (36.6 C), temperature source Oral, resp. rate 18, height 5' 8"  (1.727 m), weight 230 lb (104.3 kg), SpO2 100 %.    Lymphatics: No cervical, supraclavicular, axillary, or inguinal nodes Resp: Lungs clear bilaterally Cardio: Regular rate and rhythm GI: No hepatosplenomegaly, right abdomen colostomy with brown stool Vascular: No leg edema  Skin: Dryness of the skin diffusely, hyperpigmentation and new nail growth at the fingers and toes  Portacath/PICC-without erythema  Lab Results:  Lab Results  Component Value Date   WBC 13.1 (H) 04/14/2021   HGB 10.3 (L) 04/14/2021   HCT 30.8 (L) 04/14/2021   MCV 79.0 (L) 04/14/2021   PLT 353 04/14/2021   NEUTROABS 8.8 (H) 04/14/2021    CMP  Lab Results  Component Value Date   NA 138 04/14/2021   K 4.1 04/14/2021   CL 106 04/14/2021   CO2 22 04/14/2021   GLUCOSE 108 (H) 04/14/2021   BUN 36 (H) 04/14/2021   CREATININE 1.93 (H) 04/14/2021   CALCIUM 9.8 04/14/2021   PROT 7.4 04/14/2021   ALBUMIN 3.9 04/14/2021   AST 13 (L) 04/14/2021   ALT 14 04/14/2021   ALKPHOS 88 04/14/2021   BILITOT 0.4 04/14/2021   GFRNONAA 38 (L) 04/14/2021   GFRAA >60 04/01/2017    Lab Results  Component Value Date   CEA1 1.7 09/05/2020   CEA 1.80 04/14/2021    Medications: I have reviewed the patient's current  medications.   Assessment/Plan: Transverse colon cancer, stage IIIb (T3N1a), status post a partial transverse colectomy and end colostomy 09/10/2020 CT abdomen/pelvis 09/04/2020- possible transverse colon mass with small regional lymph nodes Colonoscopy 09/05/2020- obstructing transverse colon mass-biopsy invasive adenocarcinoma, mass could not be passed CT chest 09/09/2020-no evidence of metastatic disease, 3 x 2 mm subpleural nodule in the right upper lobe likely benign subpleural lymph node Partial transverse colectomy 09/10/2020, transverse colon tumor, no lymphovascular perineural invasion, 1/19 lymph nodes positive, negative margins, MSI stable, no loss of mismatch repair protein expression Cycle 1 CAPOX 10/08/2020, oxaliplatin dose reduced to 100 mg per metered squared secondary to renal insufficiency Cycle 2 CAPOX 10/29/2020 Cycle 3 CAPOX 11/19/2020, capecitabine dose reduced secondary to hand/foot syndrome, oxaliplatin dose reduced secondary to renal failure and poor performance status CT chest/abdomen/pelvis without contrast 12/01/2020-no acute abnormality in the chest, abdomen, or pelvis.  No evidence of obstruction. Resection of a frontal meningioma 09/01/2016 Diabetes Rectal polyp- tubular adenoma on colonoscopy 09/05/2020 Hypertension Diabetic neuropathy Epilepsy Family history of breast and prostate cancer Admission with acute renal failure 10/01/2020-secondary to lack of oral intake and lisinopril, improved with intravenous hydration and holding lisinopril Admission 12/01/2020-sepsis, diarrhea HHS, A. fib with RVR, abdominal pain 11.  Respiratory failure requiring intubation 12/06/2020 Tracheostomy 12/18/2020 12.  Progressive renal failure-CRRT started 12/06/2020, discontinued after 12/16/2020, hemodialysis initiated 13.  Thrombocytopenia secondary to chemotherapy and sepsis, heparin-induced platelet antibody and SRA negative-resolved  14.  Right lower extremity DVT diagnosed 12/12/2020-on apixaban   15.  Anemia secondary to chronic disease, renal failure, and phlebotomy       Disposition: Thomas [name] remains in clinical remission from colon cancer.  He continues to recover from the prolonged hospitalization with multiorgan failure.  Skin changes at the hands and feet continue to resolve.  He is experiencing new nail growth at the fingers and toes.  The Port-A-Cath will remain in place for now.  He will return for an office visit and Port-A-Cath flush in 7 weeks.  He plans to follow-up with Dr. Bobbye Morton to consider colostomy reversal.  Betsy Coder, MD  05/19/2021  12:01 PM

## 2021-07-07 ENCOUNTER — Inpatient Hospital Stay: Payer: Medicare HMO | Attending: Oncology | Admitting: Oncology

## 2021-07-07 ENCOUNTER — Inpatient Hospital Stay: Payer: Medicare HMO

## 2021-07-08 ENCOUNTER — Encounter: Payer: Self-pay | Admitting: *Deleted

## 2021-07-08 NOTE — Progress Notes (Signed)
Patient was "no show" for his flush/OV on 07/07/21. Scheduling message sent to reschedule for early/mid March. ?

## 2021-07-17 ENCOUNTER — Other Ambulatory Visit: Payer: Self-pay

## 2021-07-17 ENCOUNTER — Inpatient Hospital Stay: Payer: Medicare HMO | Attending: Oncology

## 2021-07-17 ENCOUNTER — Inpatient Hospital Stay: Payer: Medicare HMO | Admitting: Oncology

## 2021-07-17 VITALS — BP 157/89 | HR 64 | Temp 98.9°F | Resp 20 | Ht 68.0 in | Wt 235.0 lb

## 2021-07-17 DIAGNOSIS — C184 Malignant neoplasm of transverse colon: Secondary | ICD-10-CM | POA: Diagnosis not present

## 2021-07-17 DIAGNOSIS — Z933 Colostomy status: Secondary | ICD-10-CM | POA: Diagnosis not present

## 2021-07-17 DIAGNOSIS — Z95828 Presence of other vascular implants and grafts: Secondary | ICD-10-CM

## 2021-07-17 DIAGNOSIS — Z85038 Personal history of other malignant neoplasm of large intestine: Secondary | ICD-10-CM | POA: Insufficient documentation

## 2021-07-17 MED ORDER — SODIUM CHLORIDE 0.9% FLUSH
10.0000 mL | Freq: Once | INTRAVENOUS | Status: AC
  Administered 2021-07-17: 10 mL via INTRAVENOUS

## 2021-07-17 MED ORDER — HEPARIN SOD (PORK) LOCK FLUSH 100 UNIT/ML IV SOLN
500.0000 [IU] | Freq: Once | INTRAVENOUS | Status: AC
  Administered 2021-07-17: 500 [IU] via INTRAVENOUS

## 2021-07-17 NOTE — Progress Notes (Signed)
?[facility] ?OFFICE PROGRESS NOTE ? ? ?Diagnosis: Colon cancer ? ?INTERVAL HISTORY:  ? ?Thomas [name] returns as scheduled.  He reports feeling well.  He continues physical therapy.  He is ambulatory.  He is eating.  He has occasional small spots of blood when his wife cleans the colostomy site.  He has not been back to see Dr. Bobbye Morton ? ?Objective: ? ?Vital signs in last 24 hours: ? ?Blood pressure (!) 157/89, pulse 64, temperature 98.9 ?F (37.2 ?C), temperature source Oral, resp. rate 20, height 5' 8"  (1.727 m), weight 235 lb (106.6 kg), SpO2 100 %. ?  ? ?HEENT: Neck without mass ?Lymphatics: No cervical, supraclavicular, axillary, or inguinal nodes ?Resp: Lungs clear bilaterally ?Cardio: Regular rate and rhythm ?GI: No hepatosplenomegaly, right abdomen ostomy with brown stool ?Vascular: No leg edema  ?Skin: The skin at the hands and feet has healed.  New nail growth at the hands and feet ? ?Portacath/PICC-without erythema ? ?Lab Results: ? ?Lab Results  ?Component Value Date  ? WBC 13.1 (H) 04/14/2021  ? HGB 10.3 (L) 04/14/2021  ? HCT 30.8 (L) 04/14/2021  ? MCV 79.0 (L) 04/14/2021  ? PLT 353 04/14/2021  ? NEUTROABS 8.8 (H) 04/14/2021  ? ? ?CMP  ?Lab Results  ?Component Value Date  ? NA 138 04/14/2021  ? K 4.1 04/14/2021  ? CL 106 04/14/2021  ? CO2 22 04/14/2021  ? GLUCOSE 108 (H) 04/14/2021  ? BUN 36 (H) 04/14/2021  ? CREATININE 1.93 (H) 04/14/2021  ? CALCIUM 9.8 04/14/2021  ? PROT 7.4 04/14/2021  ? ALBUMIN 3.9 04/14/2021  ? AST 13 (L) 04/14/2021  ? ALT 14 04/14/2021  ? ALKPHOS 88 04/14/2021  ? BILITOT 0.4 04/14/2021  ? GFRNONAA 38 (L) 04/14/2021  ? GFRAA >60 04/01/2017  ? ? ?Lab Results  ?Component Value Date  ? CEA1 1.7 09/05/2020  ? CEA 1.80 04/14/2021  ? ? ?Medications: I have reviewed the patient's current medications. ? ? ?Assessment/Plan: ?Transverse colon cancer, stage IIIb (T3N1a), status post a partial transverse colectomy and end colostomy 09/10/2020 ?CT abdomen/pelvis 09/04/2020- possible  transverse colon mass with small regional lymph nodes ?Colonoscopy 09/05/2020- obstructing transverse colon mass-biopsy invasive adenocarcinoma, mass could not be passed ?CT chest 09/09/2020-no evidence of metastatic disease, 3 x 2 mm subpleural nodule in the right upper lobe likely benign subpleural lymph node ?Partial transverse colectomy 09/10/2020, transverse colon tumor, no lymphovascular perineural invasion, 1/19 lymph nodes positive, negative margins, MSI stable, no loss of mismatch repair protein expression ?Cycle 1 CAPOX 10/08/2020, oxaliplatin dose reduced to 100 mg per metered squared secondary to renal insufficiency ?Cycle 2 CAPOX 10/29/2020 ?Cycle 3 CAPOX 11/19/2020, capecitabine dose reduced secondary to hand/foot syndrome, oxaliplatin dose reduced secondary to renal failure and poor performance status ?CT chest/abdomen/pelvis without contrast 12/01/2020-no acute abnormality in the chest, abdomen, or pelvis.  No evidence of obstruction. ?Resection of a frontal meningioma 09/01/2016 ?Diabetes ?Rectal polyp- tubular adenoma on colonoscopy 09/05/2020 ?Hypertension ?Diabetic neuropathy ?Epilepsy ?Family history of breast and prostate cancer ?Admission with acute renal failure 10/01/2020-secondary to lack of oral intake and lisinopril, improved with intravenous hydration and holding lisinopril ?Admission 12/01/2020-sepsis, diarrhea HHS, A. fib with RVR, abdominal pain ?11.  Respiratory failure requiring intubation 12/06/2020 ?Tracheostomy 12/18/2020 ?12.  Progressive renal failure-CRRT started 12/06/2020, discontinued after 12/16/2020, hemodialysis initiated ?13.  Thrombocytopenia secondary to chemotherapy and sepsis, heparin-induced platelet antibody and SRA negative-resolved ?14.  Right lower extremity DVT diagnosed 12/12/2020-on apixaban  ?15.  Anemia secondary to chronic disease, renal failure,  and phlebotomy ?  ?  ?Disposition: ?Thomas [name] remains in clinical remission from colon cancer.  He continues to recover from the  lengthy hospital admission last year.  He will schedule appoint with Dr. Bobbye Morton to plan the colostomy reversal.  The Port-A-Cath will remain in place for now.  He will return for Port-A-Cath flush in 6 weeks.  He will be scheduled for a 12-week office visit. ? ?Betsy Coder, MD ? ?07/17/2021  ?12:07 PM ? ? ?

## 2021-08-28 ENCOUNTER — Inpatient Hospital Stay: Payer: Medicare HMO | Attending: Oncology

## 2021-08-28 DIAGNOSIS — Z452 Encounter for adjustment and management of vascular access device: Secondary | ICD-10-CM | POA: Insufficient documentation

## 2021-08-28 DIAGNOSIS — C184 Malignant neoplasm of transverse colon: Secondary | ICD-10-CM

## 2021-08-28 DIAGNOSIS — Z85038 Personal history of other malignant neoplasm of large intestine: Secondary | ICD-10-CM | POA: Diagnosis present

## 2021-08-28 MED ORDER — HEPARIN SOD (PORK) LOCK FLUSH 100 UNIT/ML IV SOLN
500.0000 [IU] | Freq: Once | INTRAVENOUS | Status: AC
  Administered 2021-08-28: 500 [IU] via INTRAVENOUS

## 2021-08-28 MED ORDER — SODIUM CHLORIDE 0.9% FLUSH
10.0000 mL | INTRAVENOUS | Status: DC | PRN
  Administered 2021-08-28: 10 mL via INTRAVENOUS

## 2021-09-03 ENCOUNTER — Other Ambulatory Visit: Payer: Self-pay | Admitting: *Deleted

## 2021-09-03 DIAGNOSIS — I872 Venous insufficiency (chronic) (peripheral): Secondary | ICD-10-CM

## 2021-09-04 ENCOUNTER — Encounter: Payer: Self-pay | Admitting: Podiatry

## 2021-09-04 ENCOUNTER — Ambulatory Visit: Payer: Medicare HMO | Admitting: Podiatry

## 2021-09-04 DIAGNOSIS — E1140 Type 2 diabetes mellitus with diabetic neuropathy, unspecified: Secondary | ICD-10-CM | POA: Insufficient documentation

## 2021-09-04 DIAGNOSIS — E1142 Type 2 diabetes mellitus with diabetic polyneuropathy: Secondary | ICD-10-CM

## 2021-09-04 NOTE — Progress Notes (Signed)
This patient presents to the office for diabetic foot exam.  Patient was referred to the office by  his medical doctor. This patient says there is no pain or discomfort in her feet.  No history of infection or drainage.  This patient presents to the office for foot exam due to having a history of diabetes. Patient presents saying he has nail changes due to chemotherapy. ? ?Vascular  Dorsalis pedis and posterior tibial pulses are palpable  B/L.  Capillary return  WNL.  Temperature gradient is  WNL.  Skin turgor  WNL ? ?Sensorium  Senn Weinstein monofilament wire  WNL. Normal tactile sensation. ? ?Nail Exam  Patient has normal nails with no evidence of bacterial or fungal infection. ? ?Orthopedic  Exam  Muscle tone and muscle strength  WNL.  No limitations of motion feet  B/L.  No crepitus or joint effusion noted.  Foot type is unremarkable and digits show no abnormalities.  Bony prominences are unremarkable. ? ?Skin  No open lesions.  Normal skin texture and turgor.  ? ?Diabetes with no complications ? ?Diabetic foot exam was performed.  There is no evidence  of neurologic pathology.  Absent pulses noted.  Told him we should get vascular studies.  He says he is already scheduled for vascular studies in May.   RTC   prn ? ? ?Gardiner Barefoot DPM   ?

## 2021-09-24 NOTE — Progress Notes (Signed)
Office Note     CC:  Nonpalpable pulses Requesting Provider:  [name], NP  HPI: Thomas [name] is a 66 y.o. (01-19-1956) male presenting at the request of .[name], NP for nonpalpable pulses with history of diabetes.  On exam, Thomas [name] was doing well, accompanied by his wife.  [place], he moved back after regionally meeting his wife in [place].  Meds previous medical history including morbid obesity.  He has lost over 110 pounds.  This weight loss is also raised his diabetes-he is now only prediabetic, and has been taken off of all hyperglycemic medication.  Thomas [name] denies symptoms of claudication, rest pain, tissue loss.  He is very concerned with his lower extremities as he has friends that have required amputation due to diabetes.   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   Dizziness    Dyspnea    pt states d/t weight   Epilepsy ([place])    takes Keppra daily   GERD (gastroesophageal reflux disease)    takes Omeprazole daily   Headache    Hyperlipidemia    takes Atorvastatin daily   Hypertension    takes Lotrel daily   Peripheral edema    takes Lasix daily   Peripheral neuropathy    takes Gabapentin daily   Pneumonia 3 yrs ago   hx of   Seizures ([place])    Sleep apnea    Urinary frequency     Past Surgical History:  Procedure Laterality Date   BIOPSY  09/05/2020   Procedure: BIOPSY;  Surgeon: Irene Shipper, MD;  Location: [facility] ENDOSCOPY;  Service: Endoscopy;;   BIOPSY  02/06/2021   Procedure: BIOPSY;  Surgeon: Jerene Bears, MD;  Location: [facility] ENDOSCOPY;  Service: Gastroenterology;;   CARDIAC CATHETERIZATION  7 yrs ago    COLONOSCOPY WITH PROPOFOL N/A 09/05/2020   Procedure: COLONOSCOPY WITH PROPOFOL;  Surgeon: Irene Shipper, MD;  Location: [facility];  Service: Endoscopy;  Laterality: N/A;   CRANIOTOMY N/A 09/01/2016   Procedure: CRANIOTOMY TUMOR  LEFT PTERIONAL;  Surgeon: Ashok Pall, MD;  Location: [facility];  Service: Neurosurgery;  Laterality: N/A;   cyst removed from chest      as a child   ESOPHAGOGASTRODUODENOSCOPY (EGD) WITH PROPOFOL N/A 02/06/2021   Procedure: ESOPHAGOGASTRODUODENOSCOPY (EGD) WITH PROPOFOL;  Surgeon: Jerene Bears, MD;  Location: [facility] ENDOSCOPY;  Service: Gastroenterology;  Laterality: N/A;   IR FLUORO GUIDE CV LINE LEFT  12/22/2020   IR GASTROSTOMY TUBE MOD SED  02/10/2021   IR GASTROSTOMY TUBE REMOVAL  04/15/2021   IR REMOVAL TUN CV CATH W/O FL  03/09/2021   IR US GUIDE VASC ACCESS LEFT  12/22/2020   NO PAST SURGERIES     PARTIAL COLECTOMY N/A 09/10/2020   Procedure: TRANSVERSE COLECTOMY;  Surgeon: [name], MD;  Location: [facility];  Service: General;  Laterality: N/A;   POLYPECTOMY  09/05/2020   Procedure: POLYPECTOMY;  Surgeon: Irene Shipper, MD;  Location: [facility];  Service: Endoscopy;;   PORTACATH PLACEMENT Right 10/01/2020   Procedure: INSERTION PORT-A-CATH;  Surgeon: [name], MD;  Location: [facility] OR;  Service: General;  Laterality: Right;   SUBMUCOSAL TATTOO INJECTION  09/05/2020   Procedure: SUBMUCOSAL TATTOO INJECTION;  Surgeon: Irene Shipper, MD;  Location: [facility] ENDOSCOPY;  Service: Endoscopy;;   TRACHEOSTOMY TUBE PLACEMENT N/A 01/05/2021   Procedure: TRACHEOSTOMY;  Surgeon: [name], MD;  Location: [facility];  Service: ENT;  Laterality: N/A;    Social History   Socioeconomic History   Marital status: Married    Spouse name: Mahalia   Number of children: 4   Years of education: Not on file   Highest education level: Not on file  Occupational History   Occupation: Disabled  Tobacco Use   Smoking status: Former   Smokeless tobacco: Never   Tobacco comments:     quit smoking 35 yrs ago  Vaping Use   Vaping Use: Never used  Substance and Sexual Activity   Alcohol use: No   Drug use: No   Sexual activity: Not on file  Other Topics Concern   Not on file  Social History Narrative   Married to his wife, Jerlyn Ly with total of #4 children (#2 with current wife). Lives in home with his son and daughter-in-law.     Social Determinants of Health   Financial Resource Strain: Not on file  Food Insecurity: Not on file  Transportation Needs: Not on file  Physical Activity: Not on file  Stress: Not on file  Social Connections: Not on file  Intimate Partner Violence: Not on file   Family History  Problem Relation Age of Onset   Breast cancer Mother    Breast cancer Maternal Aunt    Prostate cancer Maternal Uncle     Current Outpatient Medications  Medication Sig Dispense Refill   acetaminophen (TYLENOL) 325 MG tablet Take 2 tablets (650 mg total) by mouth every 6 (six) hours as needed for mild pain or fever.     amLODipine (NORVASC) 10 MG tablet Take 10 mg by mouth daily.     apixaban (ELIQUIS) 5 MG TABS tablet Take 1 tablet (5 mg total) by mouth 2 (two) times daily. 60 tablet 6   atorvastatin (LIPITOR) 20 MG tablet Take 20 mg by mouth daily at 6 PM.     clonazePAM (KLONOPIN) 0.5 MG tablet Take 1 tablet (0.5 mg total) by mouth 2 (two) times daily. 30 tablet 0   diclofenac Sodium (VOLTAREN) 1 % GEL Apply 4 g topically 4 (four) times daily.     Emollient (UDDERLY SMOOTH EX) Apply topically.     feeding supplement (ENSURE ENLIVE / ENSURE PLUS) LIQD Take 237 mLs by mouth 3 (three) times daily between meals. 237 mL 12   gabapentin (NEURONTIN) 100 MG capsule Take 1 capsule (100 mg total) by mouth at bedtime. (Patient taking differently: Take 100 mg by mouth 2 (two) times daily.)     levETIRAcetam (KEPPRA) 500 MG tablet Take 1 tablet (500 mg total) by mouth 2 (two) times daily.     loperamide (IMODIUM) 2 MG capsule Take 1 capsule (2 mg total) by mouth as  needed for diarrhea or loose stools. 30 capsule 0   Multiple Vitamin (MULTIVITAMIN WITH MINERALS) TABS tablet Take 1 tablet by mouth daily.     omeprazole (PRILOSEC) 40 MG capsule Take 1 capsule (40 mg total) by mouth daily. 90 capsule 1   ondansetron (ZOFRAN) 8 MG tablet Take 1 tablet (8 mg total) by mouth every 8 (eight) hours as needed for nausea or vomiting. (Patient not taking: Reported on 07/17/2021)  30 tablet 1   oxyCODONE (OXY IR/ROXICODONE) 5 MG immediate release tablet Take 0.5 tablets (2.5 mg total) by mouth every 4 (four) hours as needed for severe pain. 30 tablet 0   VITAMIN D PO Take 50 mcg by mouth in the morning and at bedtime.     No current facility-administered medications for this visit.    Allergies  Allergen Reactions   Morphine And Related Other (See Comments)    Throat swelling (can tolerate oxycodone/hydrocodone/hydromorphone)     REVIEW OF SYSTEMS:  '[X]'$  denotes positive finding, '[ ]'$  denotes negative finding Cardiac  Comments:  Chest pain or chest pressure:    Shortness of breath upon exertion:    Short of breath when lying flat:    Irregular heart rhythm:        Vascular    Pain in calf, thigh, or hip brought on by ambulation:    Pain in feet at night that wakes you up from your sleep:     Blood clot in your veins:    Leg swelling:         Pulmonary    Oxygen at home:    Productive cough:     Wheezing:         Neurologic    Sudden weakness in arms or legs:     Sudden numbness in arms or legs:     Sudden onset of difficulty speaking or slurred speech:    Temporary loss of vision in one eye:     Problems with dizziness:         Gastrointestinal    Blood in stool:     Vomited blood:         Genitourinary    Burning when urinating:     Blood in urine:        Psychiatric    Major depression:         Hematologic    Bleeding problems:    Problems with blood clotting too easily:        Skin    Rashes or ulcers:        Constitutional     Fever or chills:      PHYSICAL EXAMINATION:  There were no vitals filed for this visit.  General:  WDWN in NAD; vital signs documented above Gait: Not observed HENT: WNL, normocephalic Pulmonary: normal non-labored breathing , without wheezing Cardiac: regular HR, Abdomen: soft, NT, no masses Skin: without rashes Vascular Exam/Pulses:  Right Left  Radial 2+ (normal) 2+ (normal)  Ulnar 2+ (normal) 2+ (normal)  Femoral    Popliteal    DP 1+ (weak) 1+ (weak)  PT     Extremities: without ischemic changes, without Gangrene , without cellulitis; without open wounds;  Musculoskeletal: no muscle wasting or atrophy  Neurologic: A&O X 3;  No focal weakness or paresthesias are detected Psychiatric:  The pt has Normal affect.   Non-Invasive Vascular Imaging:   ABI Findings:  +---------+------------------+-----+---------+--------+  Right    Rt Pressure (mmHg)IndexWaveform Comment   +---------+------------------+-----+---------+--------+  Brachial 149                                       +---------+------------------+-----+---------+--------+  PTA      255               1.71 biphasic           +---------+------------------+-----+---------+--------+  DP  255               1.71 triphasic          +---------+------------------+-----+---------+--------+  Great Toe89                0.60                    +---------+------------------+-----+---------+--------+   +---------+------------------+-----+---------+-------+  Left     Lt Pressure (mmHg)IndexWaveform Comment  +---------+------------------+-----+---------+-------+  Brachial 139                                      +---------+------------------+-----+---------+-------+  PTA      255               1.71 biphasic          +---------+------------------+-----+---------+-------+  DP       255               1.71 triphasic          +---------+------------------+-----+---------+-------+  Great Toe75                0.50                   +---------+------------------+-----+---------+-------+     ASSESSMENT/PLAN: MIKLO AKEN is a 66 y.o. male presenting with longstanding history of diabetes, with weakly palpable pulses bilaterally.  Thomas [name] has been a major advocate for his health, recently losing 110 pounds, and reversing his insulin dependence.  ABIs were reviewed demonstrating medial calcinosis from previous diabetes, however fortunately he has preserved waveforms bilaterally with adequate toe pressures for healing.  Pulses were weakly palpable bilaterally.  Fortunately, Thomas [name] is asymptomatic from his peripheral arterial disease.  He denies symptoms of claudication, rest pain, tissue loss.  I had a long detailed discussion with both he and his wife regarding the above and the importance of nightly foot checks in the setting of neuropathy.  Congratulated him on his discipline and recent weight loss.  My plan is to see him yearly. I asked both he and his wife to call my office should they appreciate any new onset rest pain or tissue loss as I would be happy to see and evaluate him   Thomas John, MD [facility] [phone]

## 2021-09-25 ENCOUNTER — Encounter: Payer: Self-pay | Admitting: Vascular Surgery

## 2021-09-25 ENCOUNTER — Ambulatory Visit (HOSPITAL_COMMUNITY)
Admission: RE | Admit: 2021-09-25 | Discharge: 2021-09-25 | Disposition: A | Discharge status: Home / Self Care | Payer: Medicare HMO | Source: Ambulatory Visit | Attending: Vascular Surgery | Admitting: Vascular Surgery

## 2021-09-25 ENCOUNTER — Ambulatory Visit: Payer: Medicare HMO | Admitting: Vascular Surgery

## 2021-09-25 VITALS — BP 143/88 | HR 79 | Temp 97.9°F | Resp 20 | Ht 68.0 in | Wt 224.0 lb

## 2021-09-25 DIAGNOSIS — I739 Peripheral vascular disease, unspecified: Secondary | ICD-10-CM

## 2021-09-25 DIAGNOSIS — I872 Venous insufficiency (chronic) (peripheral): Secondary | ICD-10-CM | POA: Diagnosis present

## 2021-09-29 ENCOUNTER — Inpatient Hospital Stay (HOSPITAL_COMMUNITY)
Admission: EM | Admit: 2021-09-29 | Discharge: 2021-10-01 | DRG: 392 | Disposition: A | Discharge status: Home / Self Care | Payer: Medicare HMO | Attending: Infectious Diseases | Admitting: Infectious Diseases

## 2021-09-29 ENCOUNTER — Other Ambulatory Visit: Payer: Self-pay

## 2021-09-29 ENCOUNTER — Encounter (HOSPITAL_COMMUNITY): Payer: Self-pay | Admitting: Emergency Medicine

## 2021-09-29 DIAGNOSIS — Z7901 Long term (current) use of anticoagulants: Secondary | ICD-10-CM

## 2021-09-29 DIAGNOSIS — A084 Viral intestinal infection, unspecified: Secondary | ICD-10-CM

## 2021-09-29 DIAGNOSIS — E1121 Type 2 diabetes mellitus with diabetic nephropathy: Secondary | ICD-10-CM

## 2021-09-29 DIAGNOSIS — Z86718 Personal history of other venous thrombosis and embolism: Secondary | ICD-10-CM

## 2021-09-29 DIAGNOSIS — Z79899 Other long term (current) drug therapy: Secondary | ICD-10-CM

## 2021-09-29 DIAGNOSIS — I482 Chronic atrial fibrillation, unspecified: Secondary | ICD-10-CM | POA: Diagnosis present

## 2021-09-29 DIAGNOSIS — Z8249 Family history of ischemic heart disease and other diseases of the circulatory system: Secondary | ICD-10-CM

## 2021-09-29 DIAGNOSIS — E1122 Type 2 diabetes mellitus with diabetic chronic kidney disease: Secondary | ICD-10-CM | POA: Diagnosis present

## 2021-09-29 DIAGNOSIS — A080 Rotaviral enteritis: Secondary | ICD-10-CM | POA: Diagnosis not present

## 2021-09-29 DIAGNOSIS — N1832 Chronic kidney disease, stage 3b: Secondary | ICD-10-CM | POA: Diagnosis present

## 2021-09-29 DIAGNOSIS — I700 Atherosclerosis of aorta: Secondary | ICD-10-CM | POA: Diagnosis present

## 2021-09-29 DIAGNOSIS — K219 Gastro-esophageal reflux disease without esophagitis: Secondary | ICD-10-CM | POA: Diagnosis present

## 2021-09-29 DIAGNOSIS — Z803 Family history of malignant neoplasm of breast: Secondary | ICD-10-CM

## 2021-09-29 DIAGNOSIS — G4730 Sleep apnea, unspecified: Secondary | ICD-10-CM | POA: Diagnosis present

## 2021-09-29 DIAGNOSIS — E785 Hyperlipidemia, unspecified: Secondary | ICD-10-CM | POA: Diagnosis present

## 2021-09-29 DIAGNOSIS — Z933 Colostomy status: Secondary | ICD-10-CM

## 2021-09-29 DIAGNOSIS — Z8673 Personal history of transient ischemic attack (TIA), and cerebral infarction without residual deficits: Secondary | ICD-10-CM

## 2021-09-29 DIAGNOSIS — R112 Nausea with vomiting, unspecified: Principal | ICD-10-CM

## 2021-09-29 DIAGNOSIS — Z85038 Personal history of other malignant neoplasm of large intestine: Secondary | ICD-10-CM

## 2021-09-29 DIAGNOSIS — E875 Hyperkalemia: Secondary | ICD-10-CM | POA: Diagnosis present

## 2021-09-29 DIAGNOSIS — G40909 Epilepsy, unspecified, not intractable, without status epilepticus: Secondary | ICD-10-CM | POA: Diagnosis present

## 2021-09-29 DIAGNOSIS — E1142 Type 2 diabetes mellitus with diabetic polyneuropathy: Secondary | ICD-10-CM | POA: Diagnosis present

## 2021-09-29 DIAGNOSIS — I129 Hypertensive chronic kidney disease with stage 1 through stage 4 chronic kidney disease, or unspecified chronic kidney disease: Secondary | ICD-10-CM | POA: Diagnosis present

## 2021-09-29 HISTORY — DX: Viral intestinal infection, unspecified: A08.4

## 2021-09-29 LAB — I-STAT CHEM 8, ED
BUN: 24 mg/dL — ABNORMAL HIGH (ref 8–23)
Calcium, Ion: 1.17 mmol/L (ref 1.15–1.40)
Chloride: 112 mmol/L — ABNORMAL HIGH (ref 98–111)
Creatinine, Ser: 2.2 mg/dL — ABNORMAL HIGH (ref 0.61–1.24)
Glucose, Bld: 107 mg/dL — ABNORMAL HIGH (ref 70–99)
HCT: 40 % (ref 39.0–52.0)
Hemoglobin: 13.6 g/dL (ref 13.0–17.0)
Potassium: 4.4 mmol/L (ref 3.5–5.1)
Sodium: 138 mmol/L (ref 135–145)
TCO2: 18 mmol/L — ABNORMAL LOW (ref 22–32)

## 2021-09-29 LAB — COMPREHENSIVE METABOLIC PANEL
ALT: 47 U/L — ABNORMAL HIGH (ref 0–44)
AST: 29 U/L (ref 15–41)
Albumin: 4 g/dL (ref 3.5–5.0)
Alkaline Phosphatase: 99 U/L (ref 38–126)
Anion gap: 9 (ref 5–15)
BUN: 23 mg/dL (ref 8–23)
CO2: 15 mmol/L — ABNORMAL LOW (ref 22–32)
Calcium: 9.6 mg/dL (ref 8.9–10.3)
Chloride: 111 mmol/L (ref 98–111)
Creatinine, Ser: 2.32 mg/dL — ABNORMAL HIGH (ref 0.61–1.24)
GFR, Estimated: 31 mL/min — ABNORMAL LOW (ref >60)
Glucose, Bld: 138 mg/dL — ABNORMAL HIGH (ref 70–99)
Potassium: 6.2 mmol/L — ABNORMAL HIGH (ref 3.5–5.1)
Sodium: 135 mmol/L (ref 135–145)
Total Bilirubin: 1.1 mg/dL (ref 0.3–1.2)
Total Protein: 8.1 g/dL (ref 6.5–8.1)

## 2021-09-29 LAB — BASIC METABOLIC PANEL
Anion gap: 8 (ref 5–15)
BUN: 21 mg/dL (ref 8–23)
CO2: 18 mmol/L — ABNORMAL LOW (ref 22–32)
Calcium: 9.4 mg/dL (ref 8.9–10.3)
Chloride: 112 mmol/L — ABNORMAL HIGH (ref 98–111)
Creatinine, Ser: 2.14 mg/dL — ABNORMAL HIGH (ref 0.61–1.24)
GFR, Estimated: 34 mL/min — ABNORMAL LOW (ref >60)
Glucose, Bld: 108 mg/dL — ABNORMAL HIGH (ref 70–99)
Potassium: 4.1 mmol/L (ref 3.5–5.1)
Sodium: 138 mmol/L (ref 135–145)

## 2021-09-29 LAB — CBC WITH DIFFERENTIAL/PLATELET
Abs Immature Granulocytes: 0.04 10*3/uL (ref 0.00–0.07)
Basophils Absolute: 0.1 10*3/uL (ref 0.0–0.1)
Basophils Relative: 1 %
Eosinophils Absolute: 0.4 10*3/uL (ref 0.0–0.5)
Eosinophils Relative: 4 %
HCT: 40.6 % (ref 39.0–52.0)
Hemoglobin: 14 g/dL (ref 13.0–17.0)
Immature Granulocytes: 0 %
Lymphocytes Relative: 14 %
Lymphs Abs: 1.5 10*3/uL (ref 0.7–4.0)
MCH: 25.8 pg — ABNORMAL LOW (ref 26.0–34.0)
MCHC: 34.5 g/dL (ref 30.0–36.0)
MCV: 74.8 fL — ABNORMAL LOW (ref 80.0–100.0)
Monocytes Absolute: 1 10*3/uL (ref 0.1–1.0)
Monocytes Relative: 9 %
Neutro Abs: 7.8 10*3/uL — ABNORMAL HIGH (ref 1.7–7.7)
Neutrophils Relative %: 72 %
Platelets: 330 10*3/uL (ref 150–400)
RBC: 5.43 MIL/uL (ref 4.22–5.81)
RDW: 15 % (ref 11.5–15.5)
WBC: 10.7 10*3/uL — ABNORMAL HIGH (ref 4.0–10.5)
nRBC: 0 % (ref 0.0–0.2)

## 2021-09-29 LAB — URINALYSIS, ROUTINE W REFLEX MICROSCOPIC
Bilirubin Urine: NEGATIVE
Glucose, UA: NEGATIVE mg/dL
Ketones, ur: NEGATIVE mg/dL
Leukocytes,Ua: NEGATIVE
Nitrite: NEGATIVE
Protein, ur: 30 mg/dL — AB
Specific Gravity, Urine: 1.018 (ref 1.005–1.030)
pH: 5 (ref 5.0–8.0)

## 2021-09-29 LAB — HIV ANTIBODY (ROUTINE TESTING W REFLEX): HIV Screen 4th Generation wRfx: NONREACTIVE

## 2021-09-29 LAB — CBG MONITORING, ED: Glucose-Capillary: 102 mg/dL — ABNORMAL HIGH (ref 70–99)

## 2021-09-29 LAB — GLUCOSE, CAPILLARY: Glucose-Capillary: 111 mg/dL — ABNORMAL HIGH (ref 70–99)

## 2021-09-29 LAB — LIPASE, BLOOD: Lipase: 20 U/L (ref 11–51)

## 2021-09-29 MED ORDER — ATORVASTATIN CALCIUM 10 MG PO TABS
20.0000 mg | ORAL_TABLET | Freq: Every day | ORAL | Status: DC
  Administered 2021-09-29 – 2021-09-30 (×2): 20 mg via ORAL
  Filled 2021-09-29 (×2): qty 2

## 2021-09-29 MED ORDER — GABAPENTIN 100 MG PO CAPS: 100.0000 mg | ORAL_CAPSULE | Freq: Two times a day (BID) | ORAL | Status: DC

## 2021-09-29 MED ORDER — ONDANSETRON HCL 4 MG/2ML IJ SOLN: 4.0000 mg | Freq: Four times a day (QID) | INTRAMUSCULAR | Status: DC | PRN

## 2021-09-29 MED ORDER — ENSURE ENLIVE PO LIQD
237.0000 mL | Freq: Three times a day (TID) | ORAL | Status: DC
  Administered 2021-09-30 – 2021-10-01 (×4): 237 mL via ORAL
  Filled 2021-09-29: qty 237

## 2021-09-29 MED ORDER — AMLODIPINE BESYLATE 10 MG PO TABS
10.0000 mg | ORAL_TABLET | Freq: Every day | ORAL | Status: DC
  Administered 2021-09-30 – 2021-10-01 (×2): 10 mg via ORAL
  Filled 2021-09-29 (×3): qty 1

## 2021-09-29 MED ORDER — PEDIALYTE PO SOLN: 240.0000 mL | Freq: Four times a day (QID) | ORAL | Status: DC

## 2021-09-29 MED ORDER — LOPERAMIDE HCL 2 MG PO CAPS: 2.0000 mg | ORAL_CAPSULE | ORAL | Status: DC | PRN

## 2021-09-29 MED ORDER — APIXABAN 5 MG PO TABS
5.0000 mg | ORAL_TABLET | Freq: Two times a day (BID) | ORAL | Status: DC
  Administered 2021-09-29 – 2021-10-01 (×4): 5 mg via ORAL
  Filled 2021-09-29 (×4): qty 1

## 2021-09-29 MED ORDER — GABAPENTIN 100 MG PO CAPS
200.0000 mg | ORAL_CAPSULE | Freq: Every day | ORAL | Status: DC
Start: 2021-09-29 — End: 2021-10-01
  Administered 2021-09-29 – 2021-09-30 (×2): 200 mg via ORAL
  Filled 2021-09-29 (×2): qty 2

## 2021-09-29 MED ORDER — LEVETIRACETAM 500 MG PO TABS
1000.0000 mg | ORAL_TABLET | Freq: Two times a day (BID) | ORAL | Status: DC
  Administered 2021-09-29 – 2021-10-01 (×4): 1000 mg via ORAL
  Filled 2021-09-29 (×4): qty 2

## 2021-09-29 MED ORDER — GABAPENTIN 100 MG PO CAPS
100.0000 mg | ORAL_CAPSULE | Freq: Every day | ORAL | Status: DC
  Administered 2021-09-30 – 2021-10-01 (×2): 100 mg via ORAL
  Filled 2021-09-29 (×2): qty 1

## 2021-09-29 MED ORDER — ONDANSETRON HCL 4 MG PO TABS: 4.0000 mg | ORAL_TABLET | Freq: Four times a day (QID) | ORAL | Status: DC | PRN

## 2021-09-29 MED ORDER — SODIUM CHLORIDE 0.9 % IV BOLUS
1000.0000 mL | Freq: Once | INTRAVENOUS | Status: AC
  Administered 2021-09-29: 1000 mL via INTRAVENOUS

## 2021-09-29 MED ORDER — LOPERAMIDE HCL 2 MG PO CAPS
2.0000 mg | ORAL_CAPSULE | ORAL | Status: DC | PRN
  Administered 2021-09-29 – 2021-10-01 (×5): 2 mg via ORAL
  Filled 2021-09-29 (×6): qty 1

## 2021-09-29 MED ORDER — ACETAMINOPHEN 325 MG PO TABS
650.0000 mg | ORAL_TABLET | Freq: Four times a day (QID) | ORAL | Status: DC | PRN
  Administered 2021-09-30 – 2021-10-01 (×3): 650 mg via ORAL
  Filled 2021-09-29 (×3): qty 2

## 2021-09-29 MED ORDER — ACETAMINOPHEN 650 MG RE SUPP: 650.0000 mg | Freq: Four times a day (QID) | RECTAL | Status: DC | PRN

## 2021-09-29 MED ORDER — PANTOPRAZOLE SODIUM 40 MG PO TBEC
80.0000 mg | DELAYED_RELEASE_TABLET | Freq: Every day | ORAL | Status: DC
  Administered 2021-09-30 – 2021-10-01 (×2): 80 mg via ORAL
  Filled 2021-09-29 (×2): qty 2

## 2021-09-29 NOTE — ED Provider Notes (Signed)
[facility] EMERGENCY DEPARTMENT Provider Note   CSN: [id number] Arrival date & time: 09/29/21  0554     History  Chief Complaint  Patient presents with   Emesis    Thomas [name] is a 66 y.o. male.  Pt complains of nausea and vomiting.  Pt reports he has a history of colon cancer and has a colectomy with a ostomy.  Patient reports he lives at home he has been doing well patient states he has 2 twin grandchildren that he has been taking care of.  He reports both twins  and are currently been seen in the pediatric emergency department for vomiting and diarrhea.  Patient reports he has had a large amount of fluid coming out of his colostomy today and he has been taking Lomotil at home without relief.  Patient denies fever or chills.  He denies any cough or congestion.  Patient denies any burning with urination  The history is provided by the patient. No language interpreter was used.      Home Medications Prior to Admission medications   Medication Sig Start Date End Date Taking? Authorizing Provider  acetaminophen (TYLENOL) 325 MG tablet Take 2 tablets (650 mg total) by mouth every 6 (six) hours as needed for mild pain or fever. 03/12/21   Samella Parr, NP  amLODipine (NORVASC) 10 MG tablet Take 10 mg by mouth daily. 05/11/20   [provider]  apixaban (ELIQUIS) 5 MG TABS tablet Take 1 tablet (5 mg total) by mouth 2 (two) times daily. 03/11/21   Samella Parr, NP  atorvastatin (LIPITOR) 20 MG tablet Take 20 mg by mouth daily at 6 PM.    [provider]  clonazePAM (KLONOPIN) 0.5 MG tablet Take 1 tablet (0.5 mg total) by mouth 2 (two) times daily. 03/11/21   Samella Parr, NP  diclofenac Sodium (VOLTAREN) 1 % GEL Apply 4 g topically 4 (four) times daily. 03/11/21   Samella Parr, NP  Emollient (UDDERLY SMOOTH EX) Apply topically.    [provider]  feeding supplement (ENSURE ENLIVE / ENSURE PLUS) LIQD Take 237 mLs by mouth 3 (three)  times daily between meals. 03/12/21   Samella Parr, NP  gabapentin (NEURONTIN) 100 MG capsule Take 1 capsule (100 mg total) by mouth at bedtime. Patient taking differently: Take 100 mg by mouth 2 (two) times daily. 03/11/21   Samella Parr, NP  levETIRAcetam (KEPPRA) 500 MG tablet Take 1 tablet (500 mg total) by mouth 2 (two) times daily. 03/11/21   Samella Parr, NP  loperamide (IMODIUM) 2 MG capsule Take 1 capsule (2 mg total) by mouth as needed for diarrhea or loose stools. 03/11/21   Samella Parr, NP  Multiple Vitamin (MULTIVITAMIN WITH MINERALS) TABS tablet Take 1 tablet by mouth daily. 03/13/21   Samella Parr, NP  omeprazole (PRILOSEC) 40 MG capsule Take 1 capsule (40 mg total) by mouth daily. 03/18/21   Zehr, Laban Emperor, PA-C  ondansetron (ZOFRAN) 8 MG tablet Take 1 tablet (8 mg total) by mouth every 8 (eight) hours as needed for nausea or vomiting. 09/24/20   Ladell Pier, MD  oxyCODONE (OXY IR/ROXICODONE) 5 MG immediate release tablet Take 0.5 tablets (2.5 mg total) by mouth every 4 (four) hours as needed for severe pain. 03/11/21   Samella Parr, NP  VITAMIN D PO Take 50 mcg by mouth in the morning and at bedtime.    [provider]  Allergies    Morphine and related    Review of Systems   Review of Systems  Constitutional:  Negative for fever.  Gastrointestinal:  Positive for nausea and vomiting. Negative for abdominal distention and abdominal pain.  Musculoskeletal:  Negative for back pain.  All other systems reviewed and are negative.  Physical Exam Updated Vital Signs BP (!) 122/99   Pulse 79   Temp 98.9 F (37.2 C) (Oral)   Resp (!) 21   SpO2 100%  Physical Exam Vitals and nursing note reviewed.  Constitutional:      General: He is not in acute distress.    Appearance: He is well-developed.  HENT:     Head: Normocephalic and atraumatic.     Nose: Nose normal.     Mouth/Throat:     Mouth: Mucous membranes are moist.  Eyes:      Conjunctiva/sclera: Conjunctivae normal.  Cardiovascular:     Rate and Rhythm: Normal rate and regular rhythm.     Heart sounds: No murmur heard. Pulmonary:     Effort: Pulmonary effort is normal. No respiratory distress.     Breath sounds: Normal breath sounds.  Abdominal:     Palpations: Abdomen is soft.     Tenderness: There is no abdominal tenderness.  Musculoskeletal:        General: No swelling.  Skin:    General: Skin is warm and dry.  Neurological:     Mental Status: He is alert.  Psychiatric:        Mood and Affect: Mood normal.    ED Results / Procedures / Treatments   Labs (all labs ordered are listed, but only abnormal results are displayed) Labs Reviewed  CBC WITH DIFFERENTIAL/PLATELET - Abnormal; Notable for the following components:      Result Value   WBC 10.7 (*)    MCV 74.8 (*)    MCH 25.8 (*)    Neutro Abs 7.8 (*)    All other components within normal limits  COMPREHENSIVE METABOLIC PANEL - Abnormal; Notable for the following components:   Potassium 6.2 (*)    CO2 15 (*)    Glucose, Bld 138 (*)    Creatinine, Ser 2.32 (*)    ALT 47 (*)    GFR, Estimated 31 (*)    All other components within normal limits  URINALYSIS, ROUTINE W REFLEX MICROSCOPIC - Abnormal; Notable for the following components:   Hgb urine dipstick SMALL (*)    Protein, ur 30 (*)    Bacteria, UA RARE (*)    All other components within normal limits  I-STAT CHEM 8, ED - Abnormal; Notable for the following components:   Chloride 112 (*)    BUN 24 (*)    Creatinine, Ser 2.20 (*)    Glucose, Bld 107 (*)    TCO2 18 (*)    All other components within normal limits  LIPASE, BLOOD    EKG EKG Interpretation  Date/Time:  Tuesday Sep 29 2021 09:33:48 EDT Ventricular Rate:  81 PR Interval:  205 QRS Duration: 90 QT Interval:  382 QTC Calculation: 444 R Axis:   77 Text Interpretation: Sinus rhythm since last tracing no significant change Confirmed by Malvin Johns ([id number]) on  09/29/2021 9:36:28 AM  Radiology No results found.  Procedures Procedures    Medications Ordered in ED Medications  sodium chloride 0.9 % bolus 1,000 mL (0 mLs Intravenous Stopped 09/29/21 2637)    ED Course/ Medical Decision Making/ A&P  Medical Decision Making Amount and/or Complexity of Data Reviewed Labs: ordered.    Details: Labs ordered, reviewed and interpreted Discussion of management or test interpretation with external provider(s): I spoke with Internal Medicine resident who will admit   Risk Decision regarding hospitalization. Risk Details: Differential diagnosis I suspect viral etiology given exposure to 2 children who are both suffering with vomiting and diarrhea.  Doubt bowel obstruction as patient has good output and is not having any abdominal pain.  Patient is given IV fluids x1 L.  On patient's laboratory evaluations he had an elevated potassium of 6.2 this was repeated by doing an i-STAT and potassium is normal I suspect the first specimen was hemolyzed patient has been able to tolerate oral liquids and crackers and a portion of a sandwich.  He does continue to have a large amount of fluid output into his colostomy.  Unassigned medicine consulted for admission           Final Clinical Impression(s) / ED Diagnoses Final diagnoses:  Nausea and vomiting, unspecified vomiting type  Viral gastroenteritis    Rx / DC Orders ED Discharge Orders     None         Sidney Ace 09/29/21 1317    Malvin Johns, MD 09/29/21 1347

## 2021-09-29 NOTE — ED Triage Notes (Signed)
Patient reports persistent nausea/vomiting onset last night , no fever or diarrhea , mild generalized abdominal discomfort.

## 2021-09-29 NOTE — Hospital Course (Signed)
Code: FULL PCP: Dr. [name]  Yesterday started throwing bag Noted ostomy bag was very liquidy Imodium did not help Was able to tolerate PO intake Grandchildren had similar symptoms since Saturday Ate tuna Sunday (leds to more watery content) No changes in PO intake Vomited 3 times after PO intake in past 24 hours (only able to tolerate yogurt), no blood, no green Taken imodium  Feel better now, although still has some stomach pain, Abdomen pain similar to GERD Still has imodium  No new pain in feet but has chronic pain Denies lightheadedness or dizziness  Last seizure a year ago  Diabetes: improved A1c  Wife, son, daughter in law Denies smoking (1/2 ppd from age [age], last time 74 years ago), alcohol (drank 40 years ago), denies illict substances Able to get medications fine  Fhx: mother died of CHF, did have breast cancer, aunts with breast cancer

## 2021-09-29 NOTE — ED Notes (Signed)
Lab to add-on lipase  

## 2021-09-29 NOTE — H&P (Addendum)
Date: 09/29/2021               Patient Name:  Thomas [name] MRN: [id number]  DOB: [birth year] Age / Sex: 66 y.o., male   PCP: [name], NP              Medical Service: Internal Medicine Teaching Service              Attending Physician: Dr. Campbell Riches, MD    First Contact: Elane Fritz, MS3 Pager: [phone]  Second Contact: France Ravens, MD Pager: [place], MD Pager: [phone]       After Hours (After 5p/  First Contact Pager: [phone]  weekends / holidays): Second Contact Pager: [phone]   Chief Complaint: Abdominal pain, Nausea, Vomiting  History of Present Illness: Thomas [name] is a 66 y.o. male with past medical history of ischemic left MCA stroke, type 2 diabetes mellitus, seizures, GERD, hypertension, hyperlipidemia, sleep apnea, brain tumor s/p left pterional craniotomy in 2018, colon cancer s/p resection and colostomy May 2022 who presented to the ED complaining of 24 hours of mild epigastric abdominal pain, nausea, vomiting, and watery output in his ostomy bag. The patient reports that he has been spending time with his grandchildren who are currently in the ED with similar symptoms. He states that he has had 3 episodes of vomiting over the past 24 hours, each occurring immediately after he tried to eat solid food. He states that he has not noted any blood in his emesis. He localizes his abdominal pain to his upper abdomen and epigastric region and states that his pain today feels similar to his history of GERD. He states that he tried to take Imodium to help with the watery output in his ostomy bag but states that this did not change the consistency. He states that he did not take Zofran or any other antiemetic for his vomiting. He does endorse some bilateral foot numbness and tingling that is typically improved with his Gabapentin. He states he was told that his pulses were weak in his bilateral feet, and he recently saw vascular surgery for  this who did not recommend any interventions at this time. He denies chest pain, shortness of breath, headaches, lightheadedness, extremity pain.  Per chart review, the patient was found to have a holosystolic murmur in May of 2022 that was followed up with an echo in July of 2022 that showed EF of 60-65% with some aortic calcification but no aortic stenosis. Echo was otherwise unremarkable.   ED Course: The patient was initially found to have potassium elevated to 6.2, but this was likely lab error given that repeat potassium was 4.5. The patient was given 1L IV fluids and has since tolerated oral liquids, crackers, and half of a sandwich. Labs otherwise notable for elevated creatinine of 2.32. Patient does not have a confirmed baseline but his last creatinine was 1.93 approximately 5 months ago. ED provider was concerned for continued large fluid output into his colostomy bag, so the patient will be admitted for observation and rehydration.   Meds:  Current Outpatient Medications  Medication Instructions   acetaminophen (TYLENOL) 650 mg, Oral, Every 6 hours PRN   amLODipine (NORVASC) 10 mg, Oral, Daily   atorvastatin (LIPITOR) 20 mg, Oral, Daily-1800   clonazePAM (KLONOPIN) 0.5 mg, Oral, 2 times daily   diclofenac Sodium (VOLTAREN) 4 g, Topical, 4 times daily   Eliquis 5 mg, Oral, 2 times daily   Emollient (UDDERLY  SMOOTH EX) Apply externally   feeding supplement (ENSURE ENLIVE / ENSURE PLUS) LIQD 237 mLs, Oral, 3 times daily between meals   gabapentin (NEURONTIN) 100 mg, Oral, Daily at bedtime   levETIRAcetam (KEPPRA) 500 mg, Oral, 2 times daily   loperamide (IMODIUM) 2 mg, Oral, As needed   Multiple Vitamin (MULTIVITAMIN WITH MINERALS) TABS tablet 1 tablet, Oral, Daily   omeprazole (PRILOSEC) 40 mg, Oral, Daily   ondansetron (ZOFRAN) 8 mg, Oral, Every 8 hours PRN   oxyCODONE (OXY IR/ROXICODONE) 2.5 mg, Oral, Every 4 hours PRN   VITAMIN D PO 50 mcg, Oral, 2 times daily      Allergies: Allergies as of 09/29/2021 - Review Complete 09/29/2021  Allergen Reaction Noted   Morphine and related Other (See Comments) 01/31/2021   Past Medical History:  Diagnosis Date   Acute ischemic left MCA stroke ([place]) 04/09/2014   Acute respiratory failure with hypoxia ([place]) 04/09/2014   Aspiration pneumonia ([place])    Asthma    as a child   Brain tumor ([place])    frontal   Colon cancer ([place])    Confusion    occasionally   Diabetes mellitus without complication ([place])    takes Metformin daily.   Dizziness    Dyspnea    pt states d/t weight   Epilepsy ([place])    takes Keppra daily   GERD (gastroesophageal reflux disease)    takes Omeprazole daily   Headache    Hyperlipidemia    takes Atorvastatin daily   Hypertension    takes Lotrel daily   Peripheral edema    takes Lasix daily   Peripheral neuropathy    takes Gabapentin daily   Pneumonia 3 yrs ago   hx of   Seizures ([facility])    Sleep apnea    Urinary frequency     Family History: Mother with history of breast cancer, heart failure. No other reported family history.   Social History: Lives at home with his wife, daughter, and son-in-law. He has a 6 year pack history but has not smoked cigarettes for 35 years. He does not drink alcohol or use illicit drugs. He has not had any difficulty getting his medications recently.   Review of Systems: A complete ROS was negative except as per HPI.   Physical Exam: Blood pressure (!) 142/69, pulse 75, temperature 98.9 F (37.2 C), temperature source Oral, resp. rate 20, SpO2 99 %. Constitutional: Appears well-developed and well-nourished. No distress.  HENT: Normocephalic and atraumatic, EOMI, conjunctiva normal, moist mucous membranes Cardiovascular: Normal rate, regular rhythm, S1 and S2 present. 2/6 holosystolic murmur best heard over left upper sternal border, no rubs, gallops.  Distal pulses intact. Respiratory: Effort is normal on room air.  Lungs are clear to  auscultation bilaterally. GI: Soft. Non-distended. Minimal tenderness over the epigastric region. No appreciable organomegaly. Ostomy bag placement in the RUQ with no obvious signs of infection. Mostly liquid contents in the bag.  Musculoskeletal: Normal bulk and tone.  No peripheral edema noted. Neurological: Alert and oriented x4, no apparent focal deficits noted. Skin: Warm and dry.  No rash, erythema, lesions noted. Psychiatric: Normal mood and affect. Behavior is normal.   Labs: CBC    Component Value Date/Time   WBC 10.7 (H) 09/29/2021 0606   RBC 5.43 09/29/2021 0606   HGB 13.6 09/29/2021 0913   HGB 10.3 (L) 04/14/2021 1248   HCT 40.0 09/29/2021 0913   PLT 330 09/29/2021 0606   PLT 353 04/14/2021 1248   MCV  74.8 (L) 09/29/2021 0606   MCH 25.8 (L) 09/29/2021 0606   MCHC 34.5 09/29/2021 0606   RDW 15.0 09/29/2021 0606   LYMPHSABS 1.5 09/29/2021 0606   MONOABS 1.0 09/29/2021 0606   EOSABS 0.4 09/29/2021 0606   BASOSABS 0.1 09/29/2021 0606     CMP     Component Value Date/Time   NA 138 09/29/2021 0913   K 4.4 09/29/2021 0913   CL 112 (H) 09/29/2021 0913   CO2 15 (L) 09/29/2021 0606   GLUCOSE 107 (H) 09/29/2021 0913   BUN 24 (H) 09/29/2021 0913   CREATININE 2.20 (H) 09/29/2021 0913   CREATININE 1.93 (H) 04/14/2021 1248   CALCIUM 9.6 09/29/2021 0606   PROT 8.1 09/29/2021 0606   ALBUMIN 4.0 09/29/2021 0606   AST 29 09/29/2021 0606   AST 13 (L) 04/14/2021 1248   ALT 47 (H) 09/29/2021 0606   ALT 14 04/14/2021 1248   ALKPHOS 99 09/29/2021 0606   BILITOT 1.1 09/29/2021 0606   BILITOT 0.4 04/14/2021 1248   GFRNONAA 31 (L) 09/29/2021 0606   GFRNONAA 38 (L) 04/14/2021 1248   GFRAA >60 04/01/2017 1921    Imaging: No results found.  EKG: personally reviewed my interpretation is normal sinus rhythm with no evidence of ischemia  Assessment & Plan by Problem: Principal Problem:   Viral gastroenteritis  Thomas [name] is a 66 y.o. male with history of hypertension,  hyperlipidemia, colon cancer s/p resection with colostomy in May 2022 who presents to the ED for 24 hours of mild epigastric abdominal pain, nausea, nonbloody vomiting, and watery output in his ostomy bag after spending time with his grandchildren who also presented to the ED for similar GI symptoms.   #Epigastric Abdominal Pain #Nausea, Vomiting #History of colon cancer s/p resection and ostomy placement May 2022 Patient presented with one day of epigastric abdominal pain, nausea, nonbloody emesis, and increased liquid output in his ostomy bag after spending time with his grandchildren who also presented to the ED with similar GI symptoms. He reports 3 episodes of vomiting that occurred immediately after PO intake. Since his arrival to the ED, he has been able to tolerate PO liquids, crackers, and a sandwich. He does have mostly liquid contents in his ostomy bag and states that the output has not changed even after taking Imodium. Likely viral gastroenteritis given his sick contacts with grandchildren with similar symptoms. Abdominal pain could also be secondary to chronic GERD that he takes PPI for.  -Admit to IMTS, attending Dr. Johnnye Sima, to [place] per floor -Zofran prn for nausea -Imodium prn  -Encourage PO intake as tolerated -GI panel ordered -Continue pantoprazole 80 mg (home Omeprazole '40mg'$  daily)  #Hyperkalemia Admission K 6.2 but was noted to have hemolyzed. I-stat K 4.4. Will get a repeat BMP now and one tomorrow AM -AM BMP  #Type 2 Diabetes Mellitus #Diabetic Neuropathy Patient with longstanding diabetes who was previously on Metformin but has since been taken off this after lifestyle changes. Last A1C was 6.4 in September 2022. Patient is on Gabapentin '100mg'$  in the morning and '200mg'$  at night, which he states is a lower dose than what he was previously on.  -CBG monitoring qac and qhs -A1C -Continue Gabapentin '100mg'$  in the morning and '200mg'$  at  night  #Hypertension Patient with longstanding hypertension on amlodipine at home. Blood pressure on admission is 142/69, so we will continue Amlodipine. -Amlodipine '10mg'$  QD  #Hyperlipidemia Patient with longstanding hyperlipidemia on Atorvastatin '20mg'$  at home. Plan to continue this. -  Atorvastatin '20mg'$   #Chronic kidney disease stage IIIb Patient's creatinine is 2.32. Patient does not have a confirmed baseline but his last creatinine was 1.93 approximately 5 months ago. He does report some decreased PO intake over the past day, but no concern for AKI at this time. -Avoid nephrotoxic agents -Daily BMP  #Epilepsy Follows with [facility]. Patient on Keppra '500mg'$  bid. Last seizure was several months ago. -Continue Keppra '500mg'$  bid   #History of right lower extremity DVT #Hx of Atrial Fibrillation w/ RVR Patient on Eliquis '5mg'$  BID. No leg pain, swelling, tenderness today. Patient also had afib with RVR last year secondary to sepsis and was admitted to ICU at that time. -Continue Eliquis  #History of left frontal/temporal meningioma s/p craniotomy in 2018. The patient has History of left frontal/temporal meningioma s/p craniotomy in 2018. No current neuro symptoms.  Diet: Full VTE: Eliquis IVF: None Code: Full   Prior to Admission Living Arrangement: Home Anticipated Discharge Location: Home Barriers to Discharge: Medical stability Dispo: Anticipated discharge in approximately 1-2 day(s).    Dispo: Admit patient to Observation with expected length of stay less than 2 midnights.  Signed: Elane Fritz, Medical Student 09/29/2021, 2:56 PM  Pager: [phone]   Attestation for Student Documentation:  I personally was present and performed or re-performed the history, physical exam and medical decision-making activities of this service and have verified that the service and findings are accurately documented in the student's note.  France Ravens, MD 09/29/2021, 3:15 PM

## 2021-09-30 DIAGNOSIS — E1142 Type 2 diabetes mellitus with diabetic polyneuropathy: Secondary | ICD-10-CM | POA: Diagnosis present

## 2021-09-30 DIAGNOSIS — I700 Atherosclerosis of aorta: Secondary | ICD-10-CM | POA: Diagnosis present

## 2021-09-30 DIAGNOSIS — E1122 Type 2 diabetes mellitus with diabetic chronic kidney disease: Secondary | ICD-10-CM | POA: Diagnosis present

## 2021-09-30 DIAGNOSIS — E1121 Type 2 diabetes mellitus with diabetic nephropathy: Secondary | ICD-10-CM | POA: Diagnosis not present

## 2021-09-30 DIAGNOSIS — Z8249 Family history of ischemic heart disease and other diseases of the circulatory system: Secondary | ICD-10-CM | POA: Diagnosis not present

## 2021-09-30 DIAGNOSIS — K219 Gastro-esophageal reflux disease without esophagitis: Secondary | ICD-10-CM | POA: Diagnosis present

## 2021-09-30 DIAGNOSIS — Z8673 Personal history of transient ischemic attack (TIA), and cerebral infarction without residual deficits: Secondary | ICD-10-CM | POA: Diagnosis not present

## 2021-09-30 DIAGNOSIS — Z85038 Personal history of other malignant neoplasm of large intestine: Secondary | ICD-10-CM | POA: Diagnosis not present

## 2021-09-30 DIAGNOSIS — Z7901 Long term (current) use of anticoagulants: Secondary | ICD-10-CM | POA: Diagnosis not present

## 2021-09-30 DIAGNOSIS — E785 Hyperlipidemia, unspecified: Secondary | ICD-10-CM | POA: Diagnosis present

## 2021-09-30 DIAGNOSIS — A080 Rotaviral enteritis: Secondary | ICD-10-CM | POA: Diagnosis present

## 2021-09-30 DIAGNOSIS — I129 Hypertensive chronic kidney disease with stage 1 through stage 4 chronic kidney disease, or unspecified chronic kidney disease: Secondary | ICD-10-CM | POA: Diagnosis present

## 2021-09-30 DIAGNOSIS — E875 Hyperkalemia: Secondary | ICD-10-CM | POA: Diagnosis present

## 2021-09-30 DIAGNOSIS — N1832 Chronic kidney disease, stage 3b: Secondary | ICD-10-CM | POA: Diagnosis present

## 2021-09-30 DIAGNOSIS — Z79899 Other long term (current) drug therapy: Secondary | ICD-10-CM | POA: Diagnosis not present

## 2021-09-30 DIAGNOSIS — G40909 Epilepsy, unspecified, not intractable, without status epilepticus: Secondary | ICD-10-CM | POA: Diagnosis present

## 2021-09-30 DIAGNOSIS — A084 Viral intestinal infection, unspecified: Secondary | ICD-10-CM | POA: Diagnosis present

## 2021-09-30 DIAGNOSIS — Z803 Family history of malignant neoplasm of breast: Secondary | ICD-10-CM | POA: Diagnosis not present

## 2021-09-30 DIAGNOSIS — I482 Chronic atrial fibrillation, unspecified: Secondary | ICD-10-CM | POA: Diagnosis present

## 2021-09-30 DIAGNOSIS — G4730 Sleep apnea, unspecified: Secondary | ICD-10-CM | POA: Diagnosis present

## 2021-09-30 DIAGNOSIS — Z933 Colostomy status: Secondary | ICD-10-CM | POA: Diagnosis not present

## 2021-09-30 DIAGNOSIS — Z86718 Personal history of other venous thrombosis and embolism: Secondary | ICD-10-CM | POA: Diagnosis not present

## 2021-09-30 LAB — CBC
HCT: 40.4 % (ref 39.0–52.0)
Hemoglobin: 13.9 g/dL (ref 13.0–17.0)
MCH: 25.7 pg — ABNORMAL LOW (ref 26.0–34.0)
MCHC: 34.4 g/dL (ref 30.0–36.0)
MCV: 74.8 fL — ABNORMAL LOW (ref 80.0–100.0)
Platelets: 305 10*3/uL (ref 150–400)
RBC: 5.4 MIL/uL (ref 4.22–5.81)
RDW: 15.1 % (ref 11.5–15.5)
WBC: 9.9 10*3/uL (ref 4.0–10.5)
nRBC: 0 % (ref 0.0–0.2)

## 2021-09-30 LAB — GLUCOSE, CAPILLARY
Glucose-Capillary: 116 mg/dL — ABNORMAL HIGH (ref 70–99)
Glucose-Capillary: 107 mg/dL — ABNORMAL HIGH (ref 70–99)
Glucose-Capillary: 113 mg/dL — ABNORMAL HIGH (ref 70–99)
Glucose-Capillary: 108 mg/dL — ABNORMAL HIGH (ref 70–99)

## 2021-09-30 LAB — BASIC METABOLIC PANEL
Anion gap: 9 (ref 5–15)
BUN: 24 mg/dL — ABNORMAL HIGH (ref 8–23)
CO2: 17 mmol/L — ABNORMAL LOW (ref 22–32)
Calcium: 9.8 mg/dL (ref 8.9–10.3)
Chloride: 113 mmol/L — ABNORMAL HIGH (ref 98–111)
Creatinine, Ser: 2.32 mg/dL — ABNORMAL HIGH (ref 0.61–1.24)
GFR, Estimated: 31 mL/min — ABNORMAL LOW (ref >60)
Glucose, Bld: 110 mg/dL — ABNORMAL HIGH (ref 70–99)
Potassium: 4.4 mmol/L (ref 3.5–5.1)
Sodium: 139 mmol/L (ref 135–145)

## 2021-09-30 LAB — GASTROINTESTINAL PANEL BY PCR, STOOL (REPLACES STOOL CULTURE)
Adenovirus F40/41: NOT DETECTED
Astrovirus: NOT DETECTED
Campylobacter species: NOT DETECTED
Cryptosporidium: NOT DETECTED
Cyclospora cayetanensis: NOT DETECTED
Entamoeba histolytica: NOT DETECTED
Enteroaggregative E coli (EAEC): NOT DETECTED
Enteropathogenic E coli (EPEC): NOT DETECTED
Enterotoxigenic E coli (ETEC): NOT DETECTED
Giardia lamblia: NOT DETECTED
Norovirus GI/GII: NOT DETECTED
Plesimonas shigelloides: NOT DETECTED
Rotavirus A: DETECTED — AB
Salmonella species: NOT DETECTED
Sapovirus (I, II, IV, and V): NOT DETECTED
Shiga like toxin producing E coli (STEC): NOT DETECTED
Shigella/Enteroinvasive E coli (EIEC): NOT DETECTED
Vibrio cholerae: NOT DETECTED
Vibrio species: NOT DETECTED
Yersinia enterocolitica: NOT DETECTED

## 2021-09-30 LAB — HEMOGLOBIN A1C
Hgb A1c MFr Bld: 6 % — ABNORMAL HIGH (ref 4.8–5.6)
Mean Plasma Glucose: 125.5 mg/dL

## 2021-09-30 NOTE — Discharge Summary (Signed)
Name: Thomas [name] MRN: [id number] DOB: 09-14-1955 66 y.o. PCP: [name], NP  Date of Admission: 09/29/2021  5:57 AM Date of Discharge: 09/30/21 Attending Physician: Campbell Riches, MD  Discharge Diagnosis: 1. Rotavirus 2. Hyperkalemia 3. Type 2 Diabetes Mellitus 4. Diabetic Neuropathy 5. History of colon cancer s/p resection with ostomy May 2022 6. Hypertension 7. Hyperlipidemia 8. Chronic Kidney Disease Stage IIIb 9. Epilepsy 10. History of right lower extremity DVT 11. History of Atrial Fibrillation with RVR 12. History of left frontal/temporal meningioma s/p craniotomy in 2018  Discharge Medications: Allergies as of 09/30/2021       Reactions   Morphine And Related Other (See Comments)   Throat swelling (can tolerate oxycodone/hydrocodone/hydromorphone)        Medication List     STOP taking these medications    oxyCODONE 5 MG immediate release tablet Commonly known as: Oxy IR/ROXICODONE       TAKE these medications    acetaminophen 325 MG tablet Commonly known as: TYLENOL Take 2 tablets (650 mg total) by mouth every 6 (six) hours as needed for mild pain or fever.   amLODipine 10 MG tablet Commonly known as: NORVASC Take 10 mg by mouth daily.   atorvastatin 20 MG tablet Commonly known as: LIPITOR Take 20 mg by mouth daily at 6 PM.   clonazePAM 0.5 MG tablet Commonly known as: KLONOPIN Take 1 tablet (0.5 mg total) by mouth 2 (two) times daily.   diclofenac Sodium 1 % Gel Commonly known as: VOLTAREN Apply 4 g topically 4 (four) times daily. What changed:  when to take this reasons to take this   Eliquis 5 MG Tabs tablet Generic drug: apixaban Take 1 tablet (5 mg total) by mouth 2 (two) times daily.   feeding supplement Liqd Take 237 mLs by mouth 3 (three) times daily between meals.   gabapentin 100 MG capsule Commonly known as: NEURONTIN Take 1 capsule (100 mg total) by mouth at bedtime. What changed:  how much to take when  to take this additional instructions   levETIRAcetam 1000 MG tablet Commonly known as: KEPPRA Take 1,000 mg by mouth 2 (two) times daily. What changed: Another medication with the same name was removed. Continue taking this medication, and follow the directions you see here.   loperamide 2 MG capsule Commonly known as: IMODIUM Take 1 capsule (2 mg total) by mouth as needed for diarrhea or loose stools.   multivitamin with minerals Tabs tablet Take 1 tablet by mouth daily.   omeprazole 40 MG capsule Commonly known as: PRILOSEC Take 1 capsule (40 mg total) by mouth daily.   ondansetron 8 MG tablet Commonly known as: ZOFRAN Take 1 tablet (8 mg total) by mouth every 8 (eight) hours as needed for nausea or vomiting.   VITAMIN D PO Take 50 mcg by mouth in the morning and at bedtime.        Disposition and follow-up:   Thomas [name] was discharged from [facility] in Stable condition.  At the hospital follow up visit please address:  1.  Diabetes, CKD, Hypertension, Hyperlipidemia  2.  Labs / imaging needed at time of follow-up: A1C, CMP, lipid panel  3.  Pending labs/ test needing follow-up: None  Follow-up Appointments:   Hospital Course by problem list: 1. Rotavirus Patient presented with one day of epigastric abdominal pain, nausea, nonbloody emesis, and increased liquid output in his ostomy bag after spending time with his grandchildren who also presented  to the ED with similar GI symptoms. Since his arrival to the ED, he has been able to tolerate PO liquids, crackers, and a sandwich. He does have mostly liquid contents in his ostomy bag and states that the output has not changed even after taking Imodium. Patient received imodium while admitted to the hospital and had no recurrence of his nausea or vomiting. His vitals have remained stable. The patient's chronic GERD could also have been contributing to his epigastric abdominal pain. The patient was  taking Omeprazole '40mg'$  and was placed on Pantoprazole 80 mg in the hospital. Will continue his Omeprazole '40mg'$  at discharge and encourage hydration. GI panel was performed and was positive for rotavirus.  2. Hyperkalemia Patient was initially found to have potassium 6.2 in the ED, but this was likely lab error given that his I-stat shortly after showed potassium of 4.4. His BMP this morning showed potassium 4.4. Recommend BMP at his outpatient follow-up.  3. Type 2 Diabetes Mellitus Patient with longstanding diabetes who was previously on Metformin but has since been taken off this after lifestyle changes. Last A1C was 6.4 in September 2022. His A1C during this admission was 6.0. Will encourage continuing lifestyle changes that have lowered his A1C as well as discussion with his primary doctor at his outpatient follow-up.   4. Diabetic Neuropathy Patient with diabetic neuropathy in his bilateral feet. He takes Gabapentin '100mg'$  in the morning and '200mg'$  at night, which he states is a lower dose than what he was previously on. He continued these doses in the hospital and will be continued on them at discharge.   5. History of colon cancer s/p resection with ostomy May 2022 The patient has history of colon cancer and underwent resection with ostomy placement in May of 2022. He states that he was told colostomy reversal could be an option in a few months, as his surgeon told him that they may be able to do the reversal once he was ambulatory. The patient states that he is no longer requiring a cane for ambulation. Ostomy bag contained liquid contents during this admission, and the site had no surrounding erythema or signs of infection.  6. Hypertension Patient with longstanding hypertension on Amlodipine 10 mg dailey at home. This was continued during admission and will be continued at discharge.    7. Hyperlipidemia Patient with longstanding hyperlipidemia on Atorvastatin '20mg'$  at home. This was  continued in the hospital and will be continued at discharge.  8. Chronic Kidney Disease Stage IIIb Patient's creatinine on admission was 2.32. Patient does not have a confirmed baseline but his last creatinine was 1.93 approximately 5 months ago. He does report some decreased PO intake over the past day, but no concern for AKI at this time. His creatinine today was 2.32. Nephrotoxic agents were avoided during admission. Will encourage adequate oral hydration at discharge.  9. Epilepsy Follows with [facility]. Patient on Keppra '500mg'$  bid. Last seizure was several months ago. Continue Keppra '500mg'$  bid at discharge.  10. History of right lower extremity DVT Patient with history of acute deep vein thrombosis involving the right  common femoral vein, SF junction, right femoral vein (prox), right  proximal profunda vein, and right popliteal vein in August 2022 now on Eliquis 5 mg BID. This was continued in the ED and will be continued at discharge.   11. History of Atrial Fibrillation with RVR The patient was found to have new onset Atrial Fibrillation with RVR during a hospital admission in November  2022. He is currently on Eliquis '5mg'$  BID. Rhythm has been normal sinus during this admission. Plan to continue Eliquis '5mg'$  BID at discharge.  12. History of left frontal/temporal meningioma s/p craniotomy in 2018 The patient has History of left frontal/temporal meningioma s/p craniotomy in 2018. The patient did not have any focal neuro deficits during this admission.  Discharge Exam:   BP (!) 135/91 (BP Location: Left Arm)   Pulse 100   Temp 98.2 F (36.8 C) (Oral)   Resp 19   Ht '5\' 8"'$  (1.727 m)   Wt 98.5 kg   SpO2 99%   BMI 33.02 kg/m  Discharge exam:  Constitutional: Appears well-developed and well-nourished. No distress.  HENT: Normocephalic and atraumatic, EOMI, conjunctiva normal, moist mucous membranes Cardiovascular: Normal rate, regular rhythm, S1 and S2 present, no murmurs,  rubs, gallops.  Distal pulses intact. Respiratory: Effort is normal on room air.  Lungs are clear to auscultation bilaterally. GI: Soft. Non-distended. Non-tender. No appreciable organomegaly.  Musculoskeletal: Normal bulk and tone.  No peripheral edema noted. Neurological: Alert and oriented x4, no apparent focal deficits noted. Skin: Warm and dry.  No rash, erythema, lesions noted. Psychiatric: Normal mood and affect. Behavior is normal.   Pertinent Labs, Studies, and Procedures:   No results found.     Latest Ref Rng & Units 09/30/2021    6:22 AM 09/29/2021    9:13 AM 09/29/2021    6:06 AM  CBC  WBC 4.0 - 10.5 K/uL 9.9    10.7    Hemoglobin 13.0 - 17.0 g/dL 13.9   13.6   14.0    Hematocrit 39.0 - 52.0 % 40.4   40.0   40.6    Platelets 150 - 400 K/uL 305    330         Latest Ref Rng & Units 09/30/2021    6:22 AM 09/29/2021    5:00 PM 09/29/2021    9:13 AM  BMP  Glucose 70 - 99 mg/dL 110   108   107    BUN 8 - 23 mg/dL '24   21   24    '$ Creatinine 0.61 - 1.24 mg/dL 2.32   2.14   2.20    Sodium 135 - 145 mmol/L 139   138   138    Potassium 3.5 - 5.1 mmol/L 4.4   4.1   4.4    Chloride 98 - 111 mmol/L 113   112   112    CO2 22 - 32 mmol/L 17   18     Calcium 8.9 - 10.3 mg/dL 9.8   9.4       Discharge Instructions: Discharge Instructions     Call MD for:  difficulty breathing, headache or visual disturbances   Complete by: As directed    Call MD for:  hives   Complete by: As directed    Call MD for:  persistant nausea and vomiting   Complete by: As directed    Call MD for:  redness, tenderness, or signs of infection (pain, swelling, redness, odor or green/yellow discharge around incision site)   Complete by: As directed    Call MD for:  severe uncontrolled pain   Complete by: As directed    Call MD for:  temperature >100.4   Complete by: As directed    Diet - low sodium heart healthy   Complete by: As directed    Discharge instructions   Complete by: As directed    Dear  [name], Thomas [name] were hospitalized for abdominal pain, nausea, vomiting, and more liquid output in your ostomy bag. You were found to have viral gastroenteritis, or a stomach bug. Thank you for allowing [place] to be part of your care.   Please follow up with your primary doctor.   Please continue to take your medications as prescribed. You may use imodium as needed if the output in your ostomy bag consists of mostly liquid contents.   Please make sure to drink plenty of fluids to ensure you stay hydrated.   Please call our clinic if you have any questions or concerns, we may be able to help and keep you from a long and expensive emergency room wait. Our clinic and after hours phone number is [phone], the best time to call is Monday through Friday 9 am to 4 pm but there is always someone available 24/7 if you have an emergency. If you need medication refills please notify your pharmacy one week in advance and they will send [place] a request.   Take Care! [place] [facility]   Increase activity slowly   Complete by: As directed        You were hospitalized for abdominal pain, nausea, vomiting, and more liquid output in your ostomy bag. You were found to have viral gastroenteritis, or a stomach bug. Thank you for allowing [place] to be part of your care.   Please follow up with your primary doctor.   Please continue to take your medications as prescribed. You may use imodium as needed if the output in your ostomy bag consists of mostly liquid contents.   Please make sure to drink plenty of fluids to ensure you stay hydrated.   Please call our clinic if you have any questions or concerns, we may be able to help and keep you from a long and expensive emergency room wait. Our clinic and after hours phone number is [phone], the best time to call is Monday through Friday 9 am to 4 pm but there is always someone available 24/7 if you have an emergency. If you need medication refills please notify  your pharmacy one week in advance and they will send [place] a request.   Signed: France Ravens, MD 09/30/2021, 1:22 PM   Pager: [phone]

## 2021-09-30 NOTE — Progress Notes (Signed)
Subjective:  Overnight Events: No acute events or concerns overnight.  The patient was seen and evaluated on rounds this morning. He states that he is feeling well and endorses some very mild abdominal pain but states that he has not had any nausea or episodes of vomiting while in the hospital. He expressed concern that his GI symptoms today may be related to his previous colon cancer, but was relieved to hear that his symptoms were likely due to a viral gastroenteritis.  Objective:  Vital signs in last 24 hours: Vitals:   09/29/21 2231 09/30/21 0153 09/30/21 0607 09/30/21 0945  BP: 136/82 (!) 142/89 123/89 (!) 135/91  Pulse: 80 89 92 100  Resp: '18 18 18 19  '$ Temp: 97.6 F (36.4 C) 98 F (36.7 C) 98.2 F (36.8 C) 98.2 F (36.8 C)  TempSrc: Oral Oral Oral Oral  SpO2: 100% 100% 100% 99%  Weight: 98.5 kg     Height: '5\' 8"'$  (1.727 m)      Weight change:   Intake/Output Summary (Last 24 hours) at 09/30/2021 1325 Last data filed at 09/30/2021 0456 Gross per 24 hour  Intake --  Output 1050 ml  Net -1050 ml    Physical Exam   Constitutional: well appearing and sitting in bed, in no acute distress HENT: normocephalic atraumatic, mucous membranes moist Eyes: pupils equal and round, conjunctiva non-erythematous Cardiovascular: regular rate with normal rhythm, no m/r/g Pulmonary/Chest: normal work of breathing on room air, lungs clear to auscultation bilaterally Abdominal: soft, minimal epigastric tenderness to palpation, non-distended, bowel sounds present MSK: normal bulk and tone, 5/5 strength in bilateral upper and lower extremities.  Skin: warm and dry. Neurological: alert and answering questions appropriately. Psych: appropriate mood and affect   Assessment/Plan:  Principal Problem:   Viral gastroenteritis Active Problems:   Type 2 diabetes mellitus with diabetic nephropathy, without long-term current use of insulin ([place])  Thomas [name] is a 66 y.o. male with history of  hypertension, hyperlipidemia, colon cancer s/p resection with colostomy in May 2022 who presents to the ED for 24 hours of mild epigastric abdominal pain, nausea, nonbloody vomiting, and watery output in his ostomy bag after spending time with his grandchildren who also presented to the ED for similar GI symptoms.    #Epigastric Abdominal Pain #Nausea, Vomiting #History of colon cancer s/p resection and ostomy placement May 2022 Patient presented with one day of epigastric abdominal pain, nausea, nonbloody emesis, and increased liquid output in his ostomy bag after spending time with his grandchildren who also presented to the ED with similar GI symptoms. He reported 3 episodes of vomiting that occurred immediately after PO intake. Since his arrival to the ED, he has been able to tolerate PO liquids, crackers, and a sandwich. He does have mostly liquid contents in his ostomy bag and states that the output has not changed even after taking Imodium. Likely viral gastroenteritis given his sick contacts with grandchildren as well as GI panel positive for rotavirus. Abdominal pain could also be secondary to chronic GERD that he takes PPI for. Patient is improved since admission but states that his wife is caring for their grandchildren who are currently in the hospital. Plan for discharge home tomorrow.  -Vitals per floor -Zofran prn for nausea -Imodium prn  -Encourage PO intake as tolerated -Continue pantoprazole 80 mg (home Omeprazole '40mg'$  daily)   #Hyperkalemia Admission K 6.2 but was noted to have hemolyzed. I-stat K 4.4 in the ED. Potassium 4.4 again today.  -AM  BMP   #Type 2 Diabetes Mellitus #Diabetic Neuropathy Patient with longstanding diabetes who was previously on Metformin but has since been taken off this after lifestyle changes. Last A1C was 6.4 in September 2022. Patient is on Gabapentin '100mg'$  in the morning and '200mg'$  at night, which he states is a lower dose than what he was previously  on. A1C here in the hospital was 6.0. -CBG monitoring qac and qhs -Continue Gabapentin '100mg'$  in the morning and '200mg'$  at night   #Hypertension Patient with longstanding hypertension on amlodipine at home. Blood pressure throughout admission has been around 144Y systolic, so we will continue Amlodipine. -Amlodipine '10mg'$  QD   #Hyperlipidemia Patient with longstanding hyperlipidemia on Atorvastatin '20mg'$  at home. Plan to continue this. -Atorvastatin '20mg'$    #Chronic kidney disease stage IIIb Patient's creatinine is 2.32. Patient does not have a confirmed baseline but his last creatinine was 1.93 approximately 5 months ago. He does report some decreased PO intake over the past day, but no concern for AKI at this time. -Avoid nephrotoxic agents -Daily BMP   #Epilepsy Follows with [facility]. Patient on Keppra '500mg'$  bid. Last seizure was several months ago. -Continue Keppra '500mg'$  bid    #History of right lower extremity DVT #Hx of Atrial Fibrillation w/ RVR Patient on Eliquis '5mg'$  BID. No leg pain, swelling, tenderness today. Patient also had afib with RVR last year secondary to sepsis and was admitted to ICU at that time. -Continue Eliquis   #History of left frontal/temporal meningioma s/p craniotomy in 2018. The patient has History of left frontal/temporal meningioma s/p craniotomy in 2018. No current neuro symptoms.   Diet: Full VTE: Eliquis IVF: None Code: Full   Prior to Admission Living Arrangement: Home Anticipated Discharge Location: Home Barriers to Discharge: Inadequate support at home until tomorrow Dispo: Anticipated discharge in approximately 1-2 day(s).    LOS: 0 days   Elane Fritz, Medical Student 09/30/2021, 1:25 PM

## 2021-09-30 NOTE — Progress Notes (Signed)
NEW ADMISSION NOTE New Admission Note:   Arrival Method: From ED on stretcher Mental Orientation: Alert and Oriented x4 Telemetry: None Assessment: To be completed Skin: Intact IV: Left A.C. saline locked. Pain: No pain. 0/10. Tubes: Right Upper Quadrant Colostomy. Right Chest Port Safety Measures: Safety Fall Prevention Plan has been given, discussed and signed Admission: Completed [facility] [place] Orientation: Patient has been orientated to the room, unit and staff.  Family: Not present at the bedside.  Orders have been reviewed and implemented. Will continue to monitor the patient. Call light has been placed within reach and bed alarm has been activated.   Sharmon Revere, RN

## 2021-09-30 NOTE — Discharge Summary (Deleted)
Name: Thomas [name] MRN: [id number] DOB: 1955-07-31 66 y.o. PCP: [name], NP  Date of Admission: 09/29/2021  5:57 AM Date of Discharge: 09/30/21 Attending Physician: Campbell Riches, MD  Discharge Diagnosis: 1. Abdominal Pain, Nausea, Vomiting 2. Hyperkalemia 3. Type 2 Diabetes Mellitus 4. Diabetic Neuropathy 5. History of colon cancer s/p resection with ostomy May 2022 6. Hypertension 7. Hyperlipidemia 8. Chronic Kidney Disease Stage IIIb 9. Epilepsy 10. History of right lower extremity DVT 11. History of Atrial Fibrillation with RVR 12. History of left frontal/temporal meningioma s/p craniotomy in 2018  Discharge Medications: Allergies as of 09/30/2021       Reactions   Morphine And Related Other (See Comments)   Throat swelling (can tolerate oxycodone/hydrocodone/hydromorphone)        Medication List     STOP taking these medications    oxyCODONE 5 MG immediate release tablet Commonly known as: Oxy IR/ROXICODONE       TAKE these medications    acetaminophen 325 MG tablet Commonly known as: TYLENOL Take 2 tablets (650 mg total) by mouth every 6 (six) hours as needed for mild pain or fever.   amLODipine 10 MG tablet Commonly known as: NORVASC Take 10 mg by mouth daily.   atorvastatin 20 MG tablet Commonly known as: LIPITOR Take 20 mg by mouth daily at 6 PM.   clonazePAM 0.5 MG tablet Commonly known as: KLONOPIN Take 1 tablet (0.5 mg total) by mouth 2 (two) times daily.   diclofenac Sodium 1 % Gel Commonly known as: VOLTAREN Apply 4 g topically 4 (four) times daily. What changed:  when to take this reasons to take this   Eliquis 5 MG Tabs tablet Generic drug: apixaban Take 1 tablet (5 mg total) by mouth 2 (two) times daily.   feeding supplement Liqd Take 237 mLs by mouth 3 (three) times daily between meals.   gabapentin 100 MG capsule Commonly known as: NEURONTIN Take 1 capsule (100 mg total) by mouth at bedtime. What changed:   how much to take when to take this additional instructions   levETIRAcetam 1000 MG tablet Commonly known as: KEPPRA Take 1,000 mg by mouth 2 (two) times daily. What changed: Another medication with the same name was removed. Continue taking this medication, and follow the directions you see here.   loperamide 2 MG capsule Commonly known as: IMODIUM Take 1 capsule (2 mg total) by mouth as needed for diarrhea or loose stools.   multivitamin with minerals Tabs tablet Take 1 tablet by mouth daily.   omeprazole 40 MG capsule Commonly known as: PRILOSEC Take 1 capsule (40 mg total) by mouth daily.   ondansetron 8 MG tablet Commonly known as: ZOFRAN Take 1 tablet (8 mg total) by mouth every 8 (eight) hours as needed for nausea or vomiting.   VITAMIN D PO Take 50 mcg by mouth in the morning and at bedtime.        Disposition and follow-up:   [name] was discharged from [facility] in Stable condition.  At the hospital follow up visit please address:  1.  Diabetes, CKD, Hypertension, Hyperlipidemia  2.  Labs / imaging needed at time of follow-up: A1C, CMP, lipid panel  3.  Pending labs/ test needing follow-up: None  Follow-up Appointments:   Hospital Course by problem list: 1. Abdominal Pain, Nausea, Vomiting Patient presented with one day of epigastric abdominal pain, nausea, nonbloody emesis, and increased liquid output in his ostomy bag after spending time  with his grandchildren who also presented to the ED with similar GI symptoms. Since his arrival to the ED, he has been able to tolerate PO liquids, crackers, and a sandwich. He does have mostly liquid contents in his ostomy bag and states that the output has not changed even after taking Imodium. Patient received imodium while admitted to the hospital and had no recurrence of his nausea or vomiting. His vitals have remained stable. The patient's chronic GERD could also have been contributing to his  epigastric abdominal pain. The patient was taking Omeprazole '40mg'$  and was placed on Pantoprazole 80 mg in the hospital. Will continue his Omeprazole '40mg'$  at discharge and encourage hydration.  2. Hyperkalemia Patient was initially found to have potassium 6.2 in the ED, but this was likely lab error given that his I-stat shortly after showed potassium of 4.4. His BMP this morning showed potassium 4.4. Recommend BMP at his outpatient follow-up.  3. Type 2 Diabetes Mellitus Patient with longstanding diabetes who was previously on Metformin but has since been taken off this after lifestyle changes. Last A1C was 6.4 in September 2022. His A1C during this admission was 6.0. Will encourage continuing lifestyle changes that have lowered his A1C as well as discussion with his primary doctor at his outpatient follow-up.   4. Diabetic Neuropathy Patient with diabetic neuropathy in his bilateral feet. He takes Gabapentin '100mg'$  in the morning and '200mg'$  at night, which he states is a lower dose than what he was previously on. He continued these doses in the hospital and will be continued on them at discharge.   5. History of colon cancer s/p resection with ostomy May 2022 The patient has history of colon cancer and underwent resection with ostomy placement in May of 2022. He states that he was told colostomy reversal could be an option in a few months, as his surgeon told him that they may be able to do the reversal once he was ambulatory. The patient states that he is no longer requiring a cane for ambulation. Ostomy bag contained liquid contents during this admission, and the site had no surrounding erythema or signs of infection.  6. Hypertension Patient with longstanding hypertension on Amlodipine 10 mg dailey at home. This was continued during admission and will be continued at discharge.    7. Hyperlipidemia Patient with longstanding hyperlipidemia on Atorvastatin '20mg'$  at home. This was continued in the  hospital and will be continued at discharge.  8. Chronic Kidney Disease Stage IIIb Patient's creatinine on admission was 2.32. Patient does not have a confirmed baseline but his last creatinine was 1.93 approximately 5 months ago. He does report some decreased PO intake over the past day, but no concern for AKI at this time. His creatinine today was 2.32. Nephrotoxic agents were avoided during admission. Will encourage adequate oral hydration at discharge.  9. Epilepsy Follows with [facility]. Patient on Keppra '500mg'$  bid. Last seizure was several months ago. Continue Keppra '500mg'$  bid at discharge.  10. History of right lower extremity DVT Patient with history of acute deep vein thrombosis involving the right  common femoral vein, SF junction, right femoral vein (prox), right  proximal profunda vein, and right popliteal vein in August 2022 now on Eliquis 5 mg BID. This was continued in the ED and will be continued at discharge.   11. History of Atrial Fibrillation with RVR The patient was found to have new onset Atrial Fibrillation with RVR during a hospital admission in November 2022. He is  currently on Eliquis '5mg'$  BID. Rhythm has been normal sinus during this admission. Plan to continue Eliquis '5mg'$  BID at discharge.  12. History of left frontal/temporal meningioma s/p craniotomy in 2018 The patient has History of left frontal/temporal meningioma s/p craniotomy in 2018. The patient did not have any focal neuro deficits during this admission.  Discharge Exam:   BP (!) 135/91 (BP Location: Left Arm)   Pulse 100   Temp 98.2 F (36.8 C) (Oral)   Resp 19   Ht '5\' 8"'$  (1.727 m)   Wt 98.5 kg   SpO2 99%   BMI 33.02 kg/m  Discharge exam:  Constitutional: Appears well-developed and well-nourished. No distress.  HENT: Normocephalic and atraumatic, EOMI, conjunctiva normal, moist mucous membranes Cardiovascular: Normal rate, regular rhythm, S1 and S2 present, no murmurs, rubs, gallops.   Distal pulses intact. Respiratory: Effort is normal on room air.  Lungs are clear to auscultation bilaterally. GI: Soft. Non-distended. Non-tender. No appreciable organomegaly.  Musculoskeletal: Normal bulk and tone.  No peripheral edema noted. Neurological: Alert and oriented x4, no apparent focal deficits noted. Skin: Warm and dry.  No rash, erythema, lesions noted. Psychiatric: Normal mood and affect. Behavior is normal.   Pertinent Labs, Studies, and Procedures:   No results found.     Latest Ref Rng & Units 09/30/2021    6:22 AM 09/29/2021    9:13 AM 09/29/2021    6:06 AM  CBC  WBC 4.0 - 10.5 K/uL 9.9    10.7    Hemoglobin 13.0 - 17.0 g/dL 13.9   13.6   14.0    Hematocrit 39.0 - 52.0 % 40.4   40.0   40.6    Platelets 150 - 400 K/uL 305    330         Latest Ref Rng & Units 09/30/2021    6:22 AM 09/29/2021    5:00 PM 09/29/2021    9:13 AM  BMP  Glucose 70 - 99 mg/dL 110   108   107    BUN 8 - 23 mg/dL '24   21   24    '$ Creatinine 0.61 - 1.24 mg/dL 2.32   2.14   2.20    Sodium 135 - 145 mmol/L 139   138   138    Potassium 3.5 - 5.1 mmol/L 4.4   4.1   4.4    Chloride 98 - 111 mmol/L 113   112   112    CO2 22 - 32 mmol/L 17   18     Calcium 8.9 - 10.3 mg/dL 9.8   9.4       Discharge Instructions: Discharge Instructions     Call MD for:  difficulty breathing, headache or visual disturbances   Complete by: As directed    Call MD for:  hives   Complete by: As directed    Call MD for:  persistant nausea and vomiting   Complete by: As directed    Call MD for:  redness, tenderness, or signs of infection (pain, swelling, redness, odor or green/yellow discharge around incision site)   Complete by: As directed    Call MD for:  severe uncontrolled pain   Complete by: As directed    Call MD for:  temperature >100.4   Complete by: As directed    Diet - low sodium heart healthy   Complete by: As directed    Discharge instructions   Complete by: As directed    Dear Mr.  Mallie Mussel,  You were hospitalized for abdominal pain, nausea, vomiting, and more liquid output in your ostomy bag. You were found to have viral gastroenteritis, or a stomach bug. Thank you for allowing [place] to be part of your care.   Please follow up with your primary doctor.   Please continue to take your medications as prescribed. You may use imodium as needed if the output in your ostomy bag consists of mostly liquid contents.   Please make sure to drink plenty of fluids to ensure you stay hydrated.   Please call our clinic if you have any questions or concerns, we may be able to help and keep you from a long and expensive emergency room wait. Our clinic and after hours phone number is [phone], the best time to call is Monday through Friday 9 am to 4 pm but there is always someone available 24/7 if you have an emergency. If you need medication refills please notify your pharmacy one week in advance and they will send [place] a request.   Take Care! [place] IMT[facility]   Increase activity slowly   Complete by: As directed        You were hospitalized for abdominal pain, nausea, vomiting, and more liquid output in your ostomy bag. You were found to have viral gastroenteritis, or a stomach bug. Thank you for allowing [place] to be part of your care.   Please follow up with your primary doctor.   Please continue to take your medications as prescribed. You may use imodium as needed if the output in your ostomy bag consists of mostly liquid contents.   Please make sure to drink plenty of fluids to ensure you stay hydrated.   Please call our clinic if you have any questions or concerns, we may be able to help and keep you from a long and expensive emergency room wait. Our clinic and after hours phone number is [phone], the best time to call is Monday through Friday 9 am to 4 pm but there is always someone available 24/7 if you have an emergency. If you need medication refills please notify your  pharmacy one week in advance and they will send [place] a request.   Signed: France Ravens, MD 09/30/2021, 10:52 AM   Pager: [phone]

## 2021-09-30 NOTE — Care Management Obs Status (Signed)
[place] NOTIFICATION   Patient Details  Name: Thomas [name] MRN: [id number] Date of Birth: 10-10-1955   Medicare Observation Status Notification Given:  Yes    Tom-Johnson, Renea Ee, RN 09/30/2021, 12:11 PM

## 2021-09-30 NOTE — Discharge Instructions (Signed)
You were hospitalized for abdominal pain, nausea, vomiting, and more liquid output in your ostomy bag. You were found to have viral gastroenteritis, or a stomach bug. Thank you for allowing [place] to be part of your care.   Please follow up with your primary doctor.   Please continue to take your medications as prescribed. You may use imodium as needed if the output in your ostomy bag consists of mostly liquid contents.   Please make sure to drink plenty of fluids to ensure you stay hydrated.   Please call our clinic if you have any questions or concerns, we may be able to help and keep you from a long and expensive emergency room wait. Our clinic and after hours phone number is [phone], the best time to call is Monday through Friday 9 am to 4 pm but there is always someone available 24/7 if you have an emergency. If you need medication refills please notify your pharmacy one week in advance and they will send [place] a request.

## 2021-09-30 NOTE — Progress Notes (Signed)
  Transition of Care [facility]) Screening Note   Patient Details  Name: Thomas [name] Date of Birth: February 17, 1956   Transition of Care [facility]) CM/SW Contact:    Tom-Johnson, Renea Ee, RN Phone Number: 09/30/2021, 1:09 PM  Patient is admitted for Viral Enteritis. From home with wife and children. Currently on disability. Wife drives to and from appointments. Has a cane, walker, w/c and shower seat at home.  PCP is [name], NP and uses [facility] on [address].  Obs letter done and and given to patient at bedside with understanding noted.  Transition of Care Department [facility]) has reviewed patient and no TOC needs have been identified at this time. TOC will continue to monitor patient advancement through interdisciplinary progression rounds. If new patient transition needs arise, please place a TOC consult.

## 2021-10-01 LAB — GLUCOSE, CAPILLARY: Glucose-Capillary: 119 mg/dL — ABNORMAL HIGH (ref 70–99)

## 2021-10-01 NOTE — Progress Notes (Signed)
AVS explained to patient's family.

## 2021-10-01 NOTE — TOC Transition Note (Signed)
Transition of Care [facility]) - CM/SW Discharge Note   Patient Details  Name: ARTHA CHIASSON MRN: [id number] Date of Birth: 01-23-1956  Transition of Care [facility]) CM/SW Contact:  Tom-Johnson, Renea Ee, RN Phone Number: 10/01/2021, 10:15 AM   Clinical Narrative:     Patient is scheduled for discharge today. No PT/OT recommendations noted. Patient denies any needs. Wife to transport at discharge. No further TOC needs noted.  Final next level of care: Home/Self Care Barriers to Discharge: Barriers Resolved   Patient Goals and CMS Choice Patient states their goals for this hospitalization and ongoing recovery are:: To return home CMS Medicare.gov Compare Post Acute Care list provided to:: Patient Choice offered to / list presented to : NA  Discharge Placement                Patient to be transferred to facility by: Wife      Discharge Plan and Services   Discharge Planning Services: CM Consult Post Acute Care Choice: NA          DME Arranged: N/A DME Agency: NA         HH Agency: NA        Social Determinants of Health (SDOH) Interventions     Readmission Risk Interventions    02/16/2021   10:08 AM  Readmission Risk Prevention Plan  Transportation Screening Complete  Medication Review (RN Care Manager) Complete  PCP or Specialist appointment within 3-5 days of discharge Complete  HRI or [place] Complete  SW Recovery Care/Counseling Consult Complete  Palliative Care Screening Complete  Skilled Nursing Facility Complete

## 2021-10-09 ENCOUNTER — Inpatient Hospital Stay: Payer: Medicare HMO

## 2021-10-09 ENCOUNTER — Inpatient Hospital Stay: Payer: Medicare HMO | Admitting: Oncology

## 2021-10-09 ENCOUNTER — Inpatient Hospital Stay: Payer: Medicare HMO | Attending: Oncology

## 2021-10-09 VITALS — BP 143/82 | HR 74 | Temp 98.2°F | Resp 20 | Ht 68.0 in | Wt 220.0 lb

## 2021-10-09 DIAGNOSIS — N189 Chronic kidney disease, unspecified: Secondary | ICD-10-CM | POA: Insufficient documentation

## 2021-10-09 DIAGNOSIS — Z95828 Presence of other vascular implants and grafts: Secondary | ICD-10-CM

## 2021-10-09 DIAGNOSIS — E1140 Type 2 diabetes mellitus with diabetic neuropathy, unspecified: Secondary | ICD-10-CM | POA: Insufficient documentation

## 2021-10-09 DIAGNOSIS — Z85038 Personal history of other malignant neoplasm of large intestine: Secondary | ICD-10-CM | POA: Diagnosis present

## 2021-10-09 DIAGNOSIS — Z933 Colostomy status: Secondary | ICD-10-CM | POA: Diagnosis not present

## 2021-10-09 DIAGNOSIS — I129 Hypertensive chronic kidney disease with stage 1 through stage 4 chronic kidney disease, or unspecified chronic kidney disease: Secondary | ICD-10-CM | POA: Insufficient documentation

## 2021-10-09 DIAGNOSIS — G40909 Epilepsy, unspecified, not intractable, without status epilepticus: Secondary | ICD-10-CM | POA: Insufficient documentation

## 2021-10-09 DIAGNOSIS — I4891 Unspecified atrial fibrillation: Secondary | ICD-10-CM | POA: Insufficient documentation

## 2021-10-09 DIAGNOSIS — Z930 Tracheostomy status: Secondary | ICD-10-CM | POA: Diagnosis not present

## 2021-10-09 DIAGNOSIS — Z7901 Long term (current) use of anticoagulants: Secondary | ICD-10-CM | POA: Diagnosis not present

## 2021-10-09 DIAGNOSIS — I82401 Acute embolism and thrombosis of unspecified deep veins of right lower extremity: Secondary | ICD-10-CM | POA: Insufficient documentation

## 2021-10-09 DIAGNOSIS — I10 Essential (primary) hypertension: Secondary | ICD-10-CM | POA: Insufficient documentation

## 2021-10-09 DIAGNOSIS — Z9049 Acquired absence of other specified parts of digestive tract: Secondary | ICD-10-CM | POA: Diagnosis not present

## 2021-10-09 DIAGNOSIS — C184 Malignant neoplasm of transverse colon: Secondary | ICD-10-CM

## 2021-10-09 DIAGNOSIS — Z803 Family history of malignant neoplasm of breast: Secondary | ICD-10-CM | POA: Insufficient documentation

## 2021-10-09 DIAGNOSIS — Z8042 Family history of malignant neoplasm of prostate: Secondary | ICD-10-CM | POA: Diagnosis not present

## 2021-10-09 LAB — BASIC METABOLIC PANEL - CANCER CENTER ONLY
Anion gap: 9 (ref 5–15)
BUN: 37 mg/dL — ABNORMAL HIGH (ref 8–23)
CO2: 22 mmol/L (ref 22–32)
Calcium: 9.6 mg/dL (ref 8.9–10.3)
Chloride: 108 mmol/L (ref 98–111)
Creatinine: 1.8 mg/dL — ABNORMAL HIGH (ref 0.61–1.24)
GFR, Estimated: 42 mL/min — ABNORMAL LOW (ref >60)
Glucose, Bld: 114 mg/dL — ABNORMAL HIGH (ref 70–99)
Potassium: 3.9 mmol/L (ref 3.5–5.1)
Sodium: 139 mmol/L (ref 135–145)

## 2021-10-09 LAB — MAGNESIUM: Magnesium: 1.7 mg/dL (ref 1.7–2.4)

## 2021-10-09 LAB — CEA (ACCESS): CEA (CHCC): <1 ng/mL (ref 0.00–5.00)

## 2021-10-09 MED ORDER — HEPARIN SOD (PORK) LOCK FLUSH 100 UNIT/ML IV SOLN
500.0000 [IU] | Freq: Once | INTRAVENOUS | Status: AC
  Administered 2021-10-09: 500 [IU] via INTRAVENOUS

## 2021-10-09 MED ORDER — SODIUM CHLORIDE 0.9% FLUSH
10.0000 mL | Freq: Once | INTRAVENOUS | Status: AC
  Administered 2021-10-09: 10 mL via INTRAVENOUS

## 2021-10-09 NOTE — Progress Notes (Signed)
[place] OFFICE PROGRESS NOTE   Diagnosis: Colon cancer  INTERVAL HISTORY:   Thomas [name] returns as scheduled.  He feels well.  He has completed physical therapy.  He is ambulating without a walker.  He is getting out of the house frequently.  Appetite.  He reports intentional weight loss. He was admitted last week with diarrhea and nausea/vomiting.  He was diagnosed with rotavirus.  He reports his grandchildren also had rotavirus.  The diarrhea has resolved.  His stool is generally formed.  Objective:  Vital signs in last 24 hours:  Blood pressure (!) 143/82, pulse 74, temperature 98.2 F (36.8 C), temperature source Oral, resp. rate 20, height 5' 8"  (1.727 m), weight 220 lb (99.8 kg), SpO2 100 %.    Lymphatics: No cervical, supraclavicular, axillary, or inguinal nodes Resp: Lungs clear bilaterally Cardio: Regular rate and rhythm GI: No hepatosplenomegaly, no mass, nontender, right abdomen ileostomy with liquid stool Vascular: No leg edema  Skin: No skin breakdown at the hands or feet  Portacath/PICC-without erythema  Lab Results:  Lab Results  Component Value Date   WBC 9.9 09/30/2021   HGB 13.9 09/30/2021   HCT 40.4 09/30/2021   MCV 74.8 (L) 09/30/2021   PLT 305 09/30/2021   NEUTROABS 7.8 (H) 09/29/2021    CMP  Lab Results  Component Value Date   NA 139 10/09/2021   K 3.9 10/09/2021   CL 108 10/09/2021   CO2 22 10/09/2021   GLUCOSE 114 (H) 10/09/2021   BUN 37 (H) 10/09/2021   CREATININE 1.80 (H) 10/09/2021   CALCIUM 9.6 10/09/2021   PROT 8.1 09/29/2021   ALBUMIN 4.0 09/29/2021   AST 29 09/29/2021   ALT 47 (H) 09/29/2021   ALKPHOS 99 09/29/2021   BILITOT 1.1 09/29/2021   GFRNONAA 42 (L) 10/09/2021   GFRAA >60 04/01/2017    Lab Results  Component Value Date   CEA1 1.7 09/05/2020   CEA <1.00 10/09/2021     Medications: I have reviewed the patient's current medications.   Assessment/Plan: Transverse colon cancer, stage IIIb  (T3N1a), status post a partial transverse colectomy and end colostomy 09/10/2020 CT abdomen/pelvis 09/04/2020- possible transverse colon mass with small regional lymph nodes Colonoscopy 09/05/2020- obstructing transverse colon mass-biopsy invasive adenocarcinoma, mass could not be passed CT chest 09/09/2020-no evidence of metastatic disease, 3 x 2 mm subpleural nodule in the right upper lobe likely benign subpleural lymph node Partial transverse colectomy 09/10/2020, transverse colon tumor, no lymphovascular perineural invasion, 1/19 lymph nodes positive, negative margins, MSI stable, no loss of mismatch repair protein expression Cycle 1 CAPOX 10/08/2020, oxaliplatin dose reduced to 100 mg per metered squared secondary to renal insufficiency Cycle 2 CAPOX 10/29/2020 Cycle 3 CAPOX 11/19/2020, capecitabine dose reduced secondary to hand/foot syndrome, oxaliplatin dose reduced secondary to renal failure and poor performance status CT chest/abdomen/pelvis without contrast 12/01/2020-no acute abnormality in the chest, abdomen, or pelvis.  No evidence of obstruction. Resection of a frontal meningioma 09/01/2016 Diabetes Rectal polyp- tubular adenoma on colonoscopy 09/05/2020 Hypertension Diabetic neuropathy Epilepsy Family history of breast and prostate cancer Admission with acute renal failure 10/01/2020-secondary to lack of oral intake and lisinopril, improved with intravenous hydration and holding lisinopril Admission 12/01/2020-sepsis, diarrhea HHS, A. fib with RVR, abdominal pain 11.  Respiratory failure requiring intubation 12/06/2020 Tracheostomy 12/18/2020 12.  Progressive renal failure-CRRT started 12/06/2020, discontinued after 12/16/2020, hemodialysis initiated 13.  Thrombocytopenia secondary to chemotherapy and sepsis, heparin-induced platelet antibody and SRA negative-resolved 14.  Right lower extremity DVT diagnosed  12/12/2020-on apixaban  15.  Anemia secondary to chronic disease, renal failure, and  phlebotomy 16.  Admission 09/29/2021 with rotavirus    Disposition: [name] remains in clinical remission from colon cancer.  His performance status has improved significantly over the past several months.  He has recovered from the recent episode of rotavirus.  He would like to see Dr. Bobbye Morton to consider takedown of the ileostomy.  He has been maintained on apixaban after being diagnosed with a right lower extremity DVT while hospitalized in August of last year.  He also had atrial fibrillation in the hospital.  I feel it is okay to discontinue the apixaban with regard to the DVT.  I will defer a decision on continuing anticoagulation for atrial fibrillation to his primary provider and cardiology.  He will return for an office visit and Port-A-Cath flush in approximately 6 weeks.  Betsy Coder, MD  10/09/2021  1:00 PM

## 2021-10-09 NOTE — Patient Instructions (Signed)

## 2021-10-13 ENCOUNTER — Other Ambulatory Visit: Payer: Self-pay | Admitting: Surgery

## 2021-10-13 DIAGNOSIS — Z932 Ileostomy status: Secondary | ICD-10-CM

## 2021-10-15 ENCOUNTER — Encounter: Payer: Self-pay | Admitting: Internal Medicine

## 2021-11-20 ENCOUNTER — Inpatient Hospital Stay: Payer: Medicare HMO | Attending: Oncology

## 2021-11-20 ENCOUNTER — Inpatient Hospital Stay: Payer: Medicare HMO | Admitting: Nurse Practitioner

## 2021-11-20 ENCOUNTER — Encounter: Payer: Self-pay | Admitting: Nurse Practitioner

## 2021-11-20 VITALS — BP 146/79 | HR 84 | Temp 98.2°F | Resp 18 | Ht 68.0 in | Wt 223.4 lb

## 2021-11-20 DIAGNOSIS — Z803 Family history of malignant neoplasm of breast: Secondary | ICD-10-CM | POA: Insufficient documentation

## 2021-11-20 DIAGNOSIS — Z932 Ileostomy status: Secondary | ICD-10-CM | POA: Insufficient documentation

## 2021-11-20 DIAGNOSIS — E1140 Type 2 diabetes mellitus with diabetic neuropathy, unspecified: Secondary | ICD-10-CM | POA: Diagnosis not present

## 2021-11-20 DIAGNOSIS — C184 Malignant neoplasm of transverse colon: Secondary | ICD-10-CM

## 2021-11-20 DIAGNOSIS — D631 Anemia in chronic kidney disease: Secondary | ICD-10-CM | POA: Insufficient documentation

## 2021-11-20 DIAGNOSIS — Z85038 Personal history of other malignant neoplasm of large intestine: Secondary | ICD-10-CM | POA: Diagnosis present

## 2021-11-20 DIAGNOSIS — Z8042 Family history of malignant neoplasm of prostate: Secondary | ICD-10-CM | POA: Diagnosis not present

## 2021-11-20 DIAGNOSIS — N189 Chronic kidney disease, unspecified: Secondary | ICD-10-CM | POA: Insufficient documentation

## 2021-11-20 DIAGNOSIS — Z95828 Presence of other vascular implants and grafts: Secondary | ICD-10-CM

## 2021-11-20 DIAGNOSIS — Z9049 Acquired absence of other specified parts of digestive tract: Secondary | ICD-10-CM | POA: Diagnosis not present

## 2021-11-20 DIAGNOSIS — G40909 Epilepsy, unspecified, not intractable, without status epilepticus: Secondary | ICD-10-CM | POA: Diagnosis not present

## 2021-11-20 DIAGNOSIS — I129 Hypertensive chronic kidney disease with stage 1 through stage 4 chronic kidney disease, or unspecified chronic kidney disease: Secondary | ICD-10-CM | POA: Diagnosis not present

## 2021-11-20 DIAGNOSIS — Z930 Tracheostomy status: Secondary | ICD-10-CM | POA: Insufficient documentation

## 2021-11-20 MED ORDER — HEPARIN SOD (PORK) LOCK FLUSH 100 UNIT/ML IV SOLN
500.0000 [IU] | Freq: Once | INTRAVENOUS | Status: AC
  Administered 2021-11-20: 500 [IU] via INTRAVENOUS

## 2021-11-20 MED ORDER — SODIUM CHLORIDE 0.9% FLUSH
10.0000 mL | Freq: Once | INTRAVENOUS | Status: AC
  Administered 2021-11-20: 10 mL via INTRAVENOUS

## 2021-11-20 NOTE — Patient Instructions (Signed)

## 2021-11-20 NOTE — Progress Notes (Signed)
  [place] OFFICE PROGRESS NOTE   Diagnosis: Colon cancer  INTERVAL HISTORY:   Thomas [name] returns as scheduled.  He feels well.  He has a good appetite.  He has periodic loose stools through the ileostomy.  No nausea or vomiting.  Objective:  Vital signs in last 24 hours:  Blood pressure (!) 146/79, pulse 84, temperature 98.2 F (36.8 C), temperature source Oral, resp. rate 18, height _0  (1.727 m), weight 223 lb 6.4 oz (101.3 kg), SpO2 100 %.    Lymphatics: No palpable cervical, supraclavicular, axillary or inguinal lymph nodes. Resp: Lungs clear bilaterally. Cardio: Regular rate and rhythm. GI: Abdomen soft and nontender.  No hepatosplenomegaly.  Right abdomen ileostomy. Vascular: No leg edema. Neuro: Alert and oriented. Port-A-Cath without erythema.  Lab Results:  Lab Results  Component Value Date   WBC 9.9 09/30/2021   HGB 13.9 09/30/2021   HCT 40.4 09/30/2021   MCV 74.8 (L) 09/30/2021   PLT 305 09/30/2021   NEUTROABS 7.8 (H) 09/29/2021    Imaging:  No results found.  Medications: I have reviewed the patient's current medications.  Assessment/Plan: Transverse colon cancer, stage IIIb (T3N1a), status post a partial transverse colectomy and end colostomy 09/10/2020 CT abdomen/pelvis 09/04/2020- possible transverse colon mass with small regional lymph nodes Colonoscopy 09/05/2020- obstructing transverse colon mass-biopsy invasive adenocarcinoma, mass could not be passed CT chest 09/09/2020-no evidence of metastatic disease, 3 x 2 mm subpleural nodule in the right upper lobe likely benign subpleural lymph node Partial transverse colectomy 09/10/2020, transverse colon tumor, no lymphovascular perineural invasion, 1/19 lymph nodes positive, negative margins, MSI stable, no loss of mismatch repair protein expression Cycle 1 CAPOX 10/08/2020, oxaliplatin dose reduced to 100 mg per metered squared secondary to renal insufficiency Cycle 2 CAPOX 10/29/2020 Cycle 3  CAPOX 11/19/2020, capecitabine dose reduced secondary to hand/foot syndrome, oxaliplatin dose reduced secondary to renal failure and poor performance status CT chest/abdomen/pelvis without contrast 12/01/2020-no acute abnormality in the chest, abdomen, or pelvis.  No evidence of obstruction. Resection of a frontal meningioma 09/01/2016 Diabetes Rectal polyp- tubular adenoma on colonoscopy 09/05/2020 Hypertension Diabetic neuropathy Epilepsy Family history of breast and prostate cancer Admission with acute renal failure 10/01/2020-secondary to lack of oral intake and lisinopril, improved with intravenous hydration and holding lisinopril Admission 12/01/2020-sepsis, diarrhea HHS, A. fib with RVR, abdominal pain 11.  Respiratory failure requiring intubation 12/06/2020 Tracheostomy 12/18/2020 12.  Progressive renal failure-CRRT started 12/06/2020, discontinued after 12/16/2020, hemodialysis initiated 13.  Thrombocytopenia secondary to chemotherapy and sepsis, heparin-induced platelet antibody and SRA negative-resolved 14.  Right lower extremity DVT diagnosed 12/12/2020-on apixaban  15.  Anemia secondary to chronic disease, renal failure, and phlebotomy 16.  Admission 09/29/2021 with rotavirus    Disposition: Thomas [name] remains in clinical remission from colon cancer.  Performance status continues to be improved.  He has been referred for a barium enema and GI evaluation for a colonoscopy.  He is following up with Dr. Jamas Lav regarding timing of takedown of the ileostomy.  He would like to have the Port-A-Cath removed at the same time.  He will return for a port flush in 6 to 8 weeks.  We will see him in follow-up with a CEA in 3 to 4 months.  We are available to see him sooner if needed.    Ned Card ANP/GNP-BC   11/20/2021  8:54 AM

## 2021-11-30 ENCOUNTER — Other Ambulatory Visit: Payer: Self-pay

## 2021-12-01 ENCOUNTER — Ambulatory Visit: Payer: Medicare HMO | Admitting: Internal Medicine

## 2021-12-15 ENCOUNTER — Other Ambulatory Visit (HOSPITAL_COMMUNITY): Payer: Self-pay

## 2022-01-01 ENCOUNTER — Inpatient Hospital Stay: Payer: Medicare HMO | Attending: Oncology

## 2022-01-01 DIAGNOSIS — Z85038 Personal history of other malignant neoplasm of large intestine: Secondary | ICD-10-CM | POA: Insufficient documentation

## 2022-01-01 NOTE — Patient Instructions (Signed)
Heparin injection What is this medication? HEPARIN (HEP a rin) is an anticoagulant. It is used to treat or prevent clots in the veins, arteries, lungs, or heart. It stops clots from forming or getting bigger. This medicine prevents clotting during open-heart surgery, dialysis, or in patients who are confined to bed. This medicine may be used for other purposes; ask your health care provider or pharmacist if you have questions. COMMON BRAND NAME(S): Hep-Lock, Hep-Lock U/P, Hepflush-10, Monoject Prefill Advanced Heparin Lock Flush, SASH Normal Saline and Heparin What should I tell my care team before I take this medication? They need to know if you have any of these conditions: bleeding disorders, such as hemophilia or low blood platelets bowel disease or diverticulitis endocarditis high blood pressure liver disease recent surgery or delivery of a baby stomach ulcers an unusual or allergic reaction to heparin, benzyl alcohol, sulfites, other medicines, foods, dyes, or preservatives pregnant or trying to get pregnant breast-feeding How should I use this medication? This medicine is given by injection or infusion into a vein. It can also be given by injection of small amounts under the skin. It is usually given by a health care professional in a hospital or clinic setting. If you get this medicine at home, you will be taught how to prepare and give this medicine. Use exactly as directed. Take your medicine at regular intervals. Do not take it more often than directed. Do not stop taking except on your doctor's advice. Stopping this medicine may increase your risk of a blot clot. Be sure to refill your prescription before you run out of medicine. It is important that you put your used needles and syringes in a special sharps container. Do not put them in a trash can. If you do not have a sharps container, call your pharmacist or healthcare provider to get one. Talk to your pediatrician regarding the  use of this medicine in children. While this medicine may be prescribed for children for selected conditions, precautions do apply. Overdosage: If you think you have taken too much of this medicine contact a poison control center or emergency room at once. NOTE: This medicine is only for you. Do not share this medicine with others. What if I miss a dose? If you miss a dose, take it as soon as you can. If it is almost time for your next dose, take only that dose. Do not take double or extra doses. What may interact with this medication? Do not take this medicine with any of the following medications: aspirin and aspirin-like drugs mifepristone medicines that treat or prevent blood clots like warfarin, enoxaparin, and dalteparin palifermin protamine This medicine may also interact with the following medications: dextran digoxin hydroxychloroquine medicines for treating colds or allergies nicotine NSAIDs, medicines for pain and inflammation, like ibuprofen or naproxen phenylbutazone tetracycline antibiotics This list may not describe all possible interactions. Give your health care provider a list of all the medicines, herbs, non-prescription drugs, or dietary supplements you use. Also tell them if you smoke, drink alcohol, or use illegal drugs. Some items may interact with your medicine. What should I watch for while using this medication? Visit your healthcare professional for regular checks on your progress. You may need blood work done while you are taking this medicine. Your condition will be monitored carefully while you are receiving this medicine. It is important not to miss any appointments. Wear a medical ID bracelet or chain, and carry a card that describes your disease and details   of your medicine and dosage times. Notify your doctor or healthcare professional at once if you have cold, blue hands or feet. If you are going to need surgery or other procedure, tell your healthcare  professional that you are using this medicine. Avoid sports and activities that might cause injury while you are using this medicine. Severe falls or injuries can cause unseen bleeding. Be careful when using sharp tools or knives. Consider using an electric razor. Take special care brushing or flossing your teeth. Report any injuries, bruising, or red spots on the skin to your healthcare professional. Using this medicine for a long time may weaken your bones and increase the risk of bone fractures. You should make sure that you get enough calcium and vitamin D while you are taking this medicine. Discuss the foods you eat and the vitamins you take with your healthcare professional. Wear a medical ID bracelet or chain. Carry a card that describes your disease and details of your medicine and dosage times. What side effects may I notice from receiving this medication? Side effects that you should report to your doctor or health care professional as soon as possible: allergic reactions like skin rash, itching or hives, swelling of the face, lips, or tongue bone pain fever, chills nausea, vomiting signs and symptoms of bleeding such as bloody or black, tarry stools; red or dark-brown urine; spitting up blood or brown material that looks like coffee grounds; red spots on the skin; unusual bruising or bleeding from the eye, gums, or nose signs and symptoms of a blood clot such as chest pain; shortness of breath; pain, swelling, or warmth in the leg signs and symptoms of a stroke such as changes in vision; confusion; trouble speaking or understanding; severe headaches; sudden numbness or weakness of the face, arm or leg; trouble walking; dizziness; loss of coordination Side effects that usually do not require medical attention (report to your doctor or health care professional if they continue or are bothersome): hair loss pain, redness, or irritation at site where injected This list may not describe all  possible side effects. Call your doctor for medical advice about side effects. You may report side effects to FDA at 1-800-FDA-1088. Where should I keep my medication? Keep out of the reach of children. Store unopened vials at room temperature between 15 and 30 degrees C (59 and 86 degrees F). Do not freeze. Do not use if solution is discolored or particulate matter is present. Throw away any unused medicine after the expiration date. NOTE: This sheet is a summary. It may not cover all possible information. If you have questions about this medicine, talk to your doctor, pharmacist, or health care provider.  2023 Elsevier/Gold Standard (2005-02-01 00:00:00)  

## 2022-01-15 ENCOUNTER — Ambulatory Visit: Payer: Medicare HMO | Admitting: Internal Medicine

## 2022-02-19 ENCOUNTER — Inpatient Hospital Stay: Payer: Medicare HMO

## 2022-02-19 ENCOUNTER — Inpatient Hospital Stay: Payer: Medicare HMO | Admitting: Oncology

## 2022-02-19 ENCOUNTER — Inpatient Hospital Stay: Payer: Medicare HMO | Attending: Oncology

## 2022-02-19 VITALS — BP 139/76 | HR 68 | Temp 97.7°F | Resp 18 | Wt 210.0 lb

## 2022-02-19 DIAGNOSIS — I10 Essential (primary) hypertension: Secondary | ICD-10-CM | POA: Diagnosis not present

## 2022-02-19 DIAGNOSIS — Z9049 Acquired absence of other specified parts of digestive tract: Secondary | ICD-10-CM | POA: Diagnosis not present

## 2022-02-19 DIAGNOSIS — Z85038 Personal history of other malignant neoplasm of large intestine: Secondary | ICD-10-CM | POA: Insufficient documentation

## 2022-02-19 DIAGNOSIS — E1140 Type 2 diabetes mellitus with diabetic neuropathy, unspecified: Secondary | ICD-10-CM | POA: Insufficient documentation

## 2022-02-19 DIAGNOSIS — N19 Unspecified kidney failure: Secondary | ICD-10-CM | POA: Insufficient documentation

## 2022-02-19 DIAGNOSIS — G40909 Epilepsy, unspecified, not intractable, without status epilepticus: Secondary | ICD-10-CM | POA: Insufficient documentation

## 2022-02-19 DIAGNOSIS — Z86718 Personal history of other venous thrombosis and embolism: Secondary | ICD-10-CM | POA: Diagnosis not present

## 2022-02-19 DIAGNOSIS — D630 Anemia in neoplastic disease: Secondary | ICD-10-CM | POA: Diagnosis not present

## 2022-02-19 DIAGNOSIS — Z95828 Presence of other vascular implants and grafts: Secondary | ICD-10-CM

## 2022-02-19 DIAGNOSIS — Z8042 Family history of malignant neoplasm of prostate: Secondary | ICD-10-CM | POA: Diagnosis not present

## 2022-02-19 DIAGNOSIS — C184 Malignant neoplasm of transverse colon: Secondary | ICD-10-CM | POA: Diagnosis not present

## 2022-02-19 DIAGNOSIS — Z803 Family history of malignant neoplasm of breast: Secondary | ICD-10-CM | POA: Insufficient documentation

## 2022-02-19 DIAGNOSIS — Z933 Colostomy status: Secondary | ICD-10-CM | POA: Insufficient documentation

## 2022-02-19 LAB — CEA (ACCESS): CEA (CHCC): 1.61 ng/mL (ref 0.00–5.00)

## 2022-02-19 MED ORDER — HEPARIN SOD (PORK) LOCK FLUSH 100 UNIT/ML IV SOLN
500.0000 [IU] | Freq: Once | INTRAVENOUS | Status: AC
  Administered 2022-02-19: 500 [IU] via INTRAVENOUS

## 2022-02-19 MED ORDER — SODIUM CHLORIDE 0.9% FLUSH
10.0000 mL | Freq: Once | INTRAVENOUS | Status: AC
  Administered 2022-02-19: 10 mL via INTRAVENOUS

## 2022-02-19 NOTE — Progress Notes (Signed)
[place] OFFICE PROGRESS NOTE   Diagnosis: Colon cancer  INTERVAL HISTORY:   Mr. Koenen returns as scheduled.  He feels well.  Good appetite and energy level.  He is scheduled to see Dr. Bobbye Morton later this month to plan the ostomy takedown.  Mr. Mccarey is active getting out of the house.  Objective:  Vital signs in last 24 hours:  Blood pressure 139/76, pulse 68, temperature 97.7 F (36.5 C), temperature source Oral, resp. rate 18, weight 210 lb (95.3 kg), SpO2 100 %.    HEENT: Neck without mass Lymphatics: No cervical, supraclavicular, axillary, or inguinal nodes Resp: Lungs clear bilaterally Cardio: Regular rate and rhythm, 2/6 systolic murmur GI: Right abdomen ostomy with semiformed brown stool, no hepatomegaly Vascular: No leg edema  Portacath/PICC-without erythema  Lab Results:  Lab Results  Component Value Date   WBC 9.9 09/30/2021   HGB 13.9 09/30/2021   HCT 40.4 09/30/2021   MCV 74.8 (L) 09/30/2021   PLT 305 09/30/2021   NEUTROABS 7.8 (H) 09/29/2021    CMP  Lab Results  Component Value Date   NA 139 10/09/2021   K 3.9 10/09/2021   CL 108 10/09/2021   CO2 22 10/09/2021   GLUCOSE 114 (H) 10/09/2021   BUN 37 (H) 10/09/2021   CREATININE 1.80 (H) 10/09/2021   CALCIUM 9.6 10/09/2021   PROT 8.1 09/29/2021   ALBUMIN 4.0 09/29/2021   AST 29 09/29/2021   ALT 47 (H) 09/29/2021   ALKPHOS 99 09/29/2021   BILITOT 1.1 09/29/2021   GFRNONAA 42 (L) 10/09/2021   GFRAA >60 04/01/2017    Lab Results  Component Value Date   CEA1 1.7 09/05/2020   CEA 1.61 02/19/2022    Lab Results  Component Value Date   INR 1.1 02/10/2021   LABPROT 14.4 02/10/2021    Imaging:  No results found.  Medications: I have reviewed the patient's current medications.   Assessment/Plan:  Transverse colon cancer, stage IIIb (T3N1a), status post a partial transverse colectomy and end colostomy 09/10/2020 CT abdomen/pelvis 09/04/2020- possible transverse colon mass  with small regional lymph nodes Colonoscopy 09/05/2020- obstructing transverse colon mass-biopsy invasive adenocarcinoma, mass could not be passed CT chest 09/09/2020-no evidence of metastatic disease, 3 x 2 mm subpleural nodule in the right upper lobe likely benign subpleural lymph node Partial transverse colectomy 09/10/2020, transverse colon tumor, no lymphovascular perineural invasion, 1/19 lymph nodes positive, negative margins, MSI stable, no loss of mismatch repair protein expression Cycle 1 CAPOX 10/08/2020, oxaliplatin dose reduced to 100 mg per metered squared secondary to renal insufficiency Cycle 2 CAPOX 10/29/2020 Cycle 3 CAPOX 11/19/2020, capecitabine dose reduced secondary to hand/foot syndrome, oxaliplatin dose reduced secondary to renal failure and poor performance status CT chest/abdomen/pelvis without contrast 12/01/2020-no acute abnormality in the chest, abdomen, or pelvis.  No evidence of obstruction. Resection of a frontal meningioma 09/01/2016 Diabetes Rectal polyp- tubular adenoma on colonoscopy 09/05/2020 Hypertension Diabetic neuropathy Epilepsy Family history of breast and prostate cancer Admission with acute renal failure 10/01/2020-secondary to lack of oral intake and lisinopril, improved with intravenous hydration and holding lisinopril Admission 12/01/2020-sepsis, diarrhea HHS, A. fib with RVR, abdominal pain 11.  Respiratory failure requiring intubation 12/06/2020 Tracheostomy 12/18/2020 12.  Progressive renal failure-CRRT started 12/06/2020, discontinued after 12/16/2020, hemodialysis initiated 13.  Thrombocytopenia secondary to chemotherapy and sepsis, heparin-induced platelet antibody and SRA negative-resolved 14.  Right lower extremity DVT diagnosed 12/12/2020-on apixaban  15.  Anemia secondary to chronic disease, renal failure, and phlebotomy 16.  Admission 09/29/2021  with rotavirus     Disposition: [name].  He is in clinical remission from colon cancer.  He  is now almost 1.5 years out from diagnosis.  He will be scheduled for surveillance CTs within the next few weeks.  He will see Dr. Bobbye Morton plan the ostomy takedown.  The Port-A-Cath remains in place for now.  The Port-A-Cath can be removed if the surveillance CTs are negative.  Mr. Saephan will return for an office visit and CEA in 3 months.  Betsy Coder, MD  02/19/2022  10:57 AM

## 2022-02-19 NOTE — Patient Instructions (Signed)

## 2022-03-05 ENCOUNTER — Ambulatory Visit (HOSPITAL_BASED_OUTPATIENT_CLINIC_OR_DEPARTMENT_OTHER)
Admission: RE | Admit: 2022-03-05 | Discharge: 2022-03-05 | Disposition: A | Discharge status: Home / Self Care | Payer: Medicare HMO | Source: Ambulatory Visit | Attending: Oncology | Admitting: Oncology

## 2022-03-05 DIAGNOSIS — C184 Malignant neoplasm of transverse colon: Secondary | ICD-10-CM

## 2022-03-09 ENCOUNTER — Telehealth: Payer: Self-pay | Admitting: *Deleted

## 2022-03-09 ENCOUNTER — Ambulatory Visit: Payer: Medicare HMO | Admitting: Internal Medicine

## 2022-03-09 NOTE — Telephone Encounter (Signed)
Notified Thomas [name] of CT scan negative for cancer and F/U as schedule. Routed copy to Dr. Reather Laurence.

## 2022-03-09 NOTE — Telephone Encounter (Signed)
-----   Message from Ladell Pier, MD sent at 03/08/2022  7:42 PM EDT ----- Please call patient, CTs show no evidence of cancer, f/u as scheduled, copy to Dr Bobbye Morton

## 2022-03-09 NOTE — Telephone Encounter (Signed)
Error

## 2022-03-29 ENCOUNTER — Inpatient Hospital Stay: Payer: Medicare HMO | Attending: Oncology

## 2022-03-29 ENCOUNTER — Inpatient Hospital Stay: Payer: Medicare HMO

## 2022-03-29 DIAGNOSIS — Z452 Encounter for adjustment and management of vascular access device: Secondary | ICD-10-CM | POA: Insufficient documentation

## 2022-03-29 DIAGNOSIS — Z85038 Personal history of other malignant neoplasm of large intestine: Secondary | ICD-10-CM | POA: Diagnosis present

## 2022-03-29 DIAGNOSIS — C184 Malignant neoplasm of transverse colon: Secondary | ICD-10-CM

## 2022-03-29 DIAGNOSIS — Z95828 Presence of other vascular implants and grafts: Secondary | ICD-10-CM

## 2022-03-29 LAB — CEA (ACCESS): CEA (CHCC): 1.47 ng/mL (ref 0.00–5.00)

## 2022-03-29 MED ORDER — HEPARIN SOD (PORK) LOCK FLUSH 100 UNIT/ML IV SOLN
500.0000 [IU] | Freq: Once | INTRAVENOUS | Status: AC
  Administered 2022-03-29: 500 [IU] via INTRAVENOUS

## 2022-03-29 MED ORDER — SODIUM CHLORIDE 0.9% FLUSH
10.0000 mL | Freq: Once | INTRAVENOUS | Status: AC
  Administered 2022-03-29: 10 mL via INTRAVENOUS

## 2022-03-29 NOTE — Patient Instructions (Signed)

## 2022-04-13 ENCOUNTER — Ambulatory Visit: Payer: Medicare HMO | Admitting: Physician Assistant

## 2022-04-13 ENCOUNTER — Encounter: Payer: Self-pay | Admitting: Physician Assistant

## 2022-04-13 VITALS — BP 132/72 | HR 73 | Ht 71.0 in | Wt 203.5 lb

## 2022-04-13 DIAGNOSIS — K219 Gastro-esophageal reflux disease without esophagitis: Secondary | ICD-10-CM | POA: Diagnosis not present

## 2022-04-13 DIAGNOSIS — E1140 Type 2 diabetes mellitus with diabetic neuropathy, unspecified: Secondary | ICD-10-CM

## 2022-04-13 DIAGNOSIS — Z85038 Personal history of other malignant neoplasm of large intestine: Secondary | ICD-10-CM | POA: Diagnosis not present

## 2022-04-13 DIAGNOSIS — G40909 Epilepsy, unspecified, not intractable, without status epilepticus: Secondary | ICD-10-CM

## 2022-04-13 DIAGNOSIS — Z933 Colostomy status: Secondary | ICD-10-CM

## 2022-04-13 DIAGNOSIS — Z939 Artificial opening status, unspecified: Secondary | ICD-10-CM

## 2022-04-13 MED ORDER — NA SULFATE-K SULFATE-MG SULF 17.5-3.13-1.6 GM/177ML PO SOLN: ORAL | 0 refills | Status: DC

## 2022-04-13 NOTE — Progress Notes (Signed)
Noted  

## 2022-04-13 NOTE — Progress Notes (Signed)
Subjective:    Patient ID: Thomas [name], male    DOB: 10-15-1955, 66 y.o.   MRN: [id number]  HPI Thomas [name] is a pleasant 66 year old male, established with Dr. Henrene Pastor who comes in today to discuss follow-up colonoscopy.  He was initially seen in April 2002 during hospitalization and had Colonoscopy at that time per Dr. Henrene Pastor that showed a nearly obstructing colonic mass in the transverse colon, he had one 6 mm polyp removed from the rectum and was noted to have a few diverticuli in the left colon.  Path was consistent with an adenocarcinoma and he underwent exploratory lap, mobilization of splenic flexure and transverse colectomy and end colostomy on 09/10/2020.  He was discharged home on 09/17/2020 then  had re admission 7/24- 03/12/21.  He had undergone 3 cycles of chemotherapy prior to this admission, felt to have sapacitabine toxicity, severe diarrhea, high ostomy output and acute renal failure.  He also developed metabolic encephalopathy, hypoxia, aspiration pneumonia required prolonged intubation and pressor support.  He eventually recuperated and has been doing okay over the past 1 year. His disease was determined to be stage IIIb and he has been determined to be in remission from an oncology standpoint.  He is hoping to have his ostomy reversed per Dr. Bobbye Morton who did his surgery and also his port. He is not having any issues with the ostomy, has no complaints of abdominal pain, no melena or hematochezia.  Appetite has been good and he has gained weight over the past year. Other medical issues include hypertension, history of A-fib with RVR, remote CVA, adult onset diabetes mellitus with neuropathy, history of duodenal ulcer diagnosed while hospitalized September 2022 during that prolonged stay, H. pylori negative.  Also had grade C esophagitis. He also had a DVT August 2022 was anticoagulated for 6 months then discontinued.  Review of Systems Pertinent positive and negative review of systems were noted in  the above HPI section.  All other review of systems was otherwise negative.   Outpatient Encounter Medications as of 04/13/2022  Medication Sig   acetaminophen (TYLENOL) 325 MG tablet Take 2 tablets (650 mg total) by mouth every 6 (six) hours as needed for mild pain or fever.   allopurinol (ZYLOPRIM) 100 MG tablet Take 100 mg by mouth daily.   amLODipine (NORVASC) 10 MG tablet Take 10 mg by mouth daily.   atorvastatin (LIPITOR) 20 MG tablet Take 20 mg by mouth daily at 6 PM.   feeding supplement (ENSURE ENLIVE / ENSURE PLUS) LIQD Take 237 mLs by mouth 3 (three) times daily between meals.   ferrous sulfate 325 (65 FE) MG tablet Take 325 mg by mouth daily with breakfast.   gabapentin (NEURONTIN) 100 MG capsule Take 1 capsule (100 mg total) by mouth at bedtime. (Patient taking differently: Take 100-200 mg by mouth See admin instructions. Take one tablet by mouth in the morning, then take two tablets in the afternoon)   levETIRAcetam (KEPPRA) 1000 MG tablet Take 1,000 mg by mouth 2 (two) times daily.   loperamide (IMODIUM) 2 MG capsule Take 1 capsule (2 mg total) by mouth as needed for diarrhea or loose stools.   Multiple Vitamin (MULTIVITAMIN WITH MINERALS) TABS tablet Take 1 tablet by mouth daily.   Na Sulfate-K Sulfate-Mg Sulf 17.5-3.13-1.6 GM/177ML SOLN Use as directed; may use generic; goodrx card if insurance will not cover generic   omeprazole (PRILOSEC) 40 MG capsule Take 1 capsule (40 mg total) by mouth daily.   VITAMIN D PO  Take 50 mcg by mouth in the morning and at bedtime.   sertraline (ZOLOFT) 50 MG tablet Take 50 mg by mouth daily as needed. (Patient not taking: Reported on 04/13/2022)   No facility-administered encounter medications on file as of 04/13/2022.   Allergies  Allergen Reactions   Morphine And Related Other (See Comments)    Throat swelling (can tolerate oxycodone/hydrocodone/hydromorphone)   Patient Active Problem List   Diagnosis Date Noted   Viral gastroenteritis  09/29/2021   Diabetic neuropathy ([place]) 09/04/2021   Gastroesophageal reflux disease without esophagitis 03/18/2021   Epilepsy (HCC)    Hyperlipidemia    Peripheral neuropathy    Cerebral embolism with cerebral infarction 02/06/2021   Acute esophagitis    Duodenal ulcer disease    Palliative care by specialist    Acute respiratory failure with hypoxemia ([place]) 12/07/2020   Acute renal failure with acute cortical necrosis (HCC)    Shock ([place])    Acute metabolic encephalopathy 31/49/7026   Malnutrition of moderate degree 12/03/2020   Atrial fibrillation with RVR (HCC)    Sepsis (HCC)    Adverse effect of chemotherapy 12/01/2020   Pressure injury of skin 12/01/2020   Acute renal failure superimposed on stage 2 chronic kidney disease ([place]) 10/01/2020   Hyponatremia 10/01/2020   Iron deficiency anemia due to chronic blood loss 10/01/2020   Colon cancer ([place]) 09/23/2020   Colostomy in place [facility]) 09/11/2020   History of ischemic left MCA stroke 2015 09/11/2020   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    Rectal polyp    Diverticulosis of colon without hemorrhage    Abdominal pain 09/04/2020   Diabetic polyneuropathy associated with type 2 diabetes mellitus ([place]) 06/28/2018   Venous insufficiency of both lower extremities 06/28/2018   S/P craniotomy 09/01/2016   Meningioma ([place]) 09/01/2016   Trauma    Type 2 diabetes mellitus with diabetic nephropathy, without long-term current use of insulin ([place])    Hypertension    Acute encephalopathy 04/09/2014   Hyperammonemia ([place]) 04/09/2014   Social History   Socioeconomic History   Marital status: Married    Spouse name: Cytogeneticist   Number of children: 4   Years of education: Not on file   Highest education level: Not on file  Occupational History   Occupation: Disabled  Tobacco Use   Smoking status: Former   Smokeless tobacco: Never   Tobacco comments:    quit smoking 35 yrs ago  Vaping Use    Vaping Use: Never used  Substance and Sexual Activity   Alcohol use: No   Drug use: No   Sexual activity: Not on file  Other Topics Concern   Not on file  Social History Narrative   Married to his wife, Jerlyn Ly with total of #4 children (#2 with current wife). Lives in home with his son and daughter-in-law.     Social Determinants of Health   Financial Resource Strain: Not on file  Food Insecurity: Not on file  Transportation Needs: Not on file  Physical Activity: Not on file  Stress: Not on file  Social Connections: Not on file  Intimate Partner Violence: Not on file    Mr. Grandfield's family history includes Breast cancer in his maternal aunt and mother; Prostate cancer in his maternal uncle.      Objective:    Vitals:   04/13/22 1045  BP: 132/72  Pulse: 73    Physical Exam Well-developed well-nourished older AA male  in no acute distress.  Height, Weight,203  BMI 28.3 accompanied by wife  HEENT; nontraumatic normocephalic, EOMI, PE R LA, sclera anicteric. Oropharynx; not examined today Neck; supple, no JVD Cardiovascular; regular rate and rhythm with S1-S2, no murmur rub or gallop Pulmonary; Clear bilaterally Abdomen; soft, nontender, nondistended, no palpable mass or hepatosplenomegaly, bowel sounds are active, wide midline incisional scar, colostomy in the right mid quadrant Rectal; not done today Skin; benign exam, no jaundice rash or appreciable lesions Extremities; no clubbing cyanosis or edema skin warm and dry Neuro/Psych; alert and oriented x4, grossly nonfocal mood and affect appropriate        Assessment & Plan:   #84 66 year old African-American male with transverse colon adenocarcinoma stage IIIb, status post partial transverse colectomy and end colostomy 09/10/2020.\ He completed a course of chemotherapy Prolonged hospital admission July 2022 through November 1497 complicated by acute renal failure possibly chemo related, sepsis, atrial fibrillation with  RVR, respiratory failure with intubation, had metabolic encephalopathy-all resolved  #2 right lower extremity DVT August 2022, completed 6 months anticoagulation #3 adult onset diabetes mellitus with diabetic neuropathy #4 adenomatous colon polyp of the rectum also found at time of initial colonoscopy #5 seizure disorder -stable #6, chronic GERD, history of grade C esophagitis and a few shallow duodenal ulcers at EGD September 2022, H. pylori negative and maintained on omeprazole 40 mg  Plan; Patient will be scheduled for colonoscopy with Dr. Henrene Pastor in the [facility].  Procedure was discussed in detail with the patient including indications risks and benefits and he is agreeable to proceed.  Patient has been having issues finding ostomy supplies, and he was given the names of 2 medical supply companies here in [place].  We will initiate referral back to Dr. Merilyn Baba [facility], as he hopes to undergo colostomy reversal at some point within the next 3 to 4 months.     Jeffie Spivack S Irl Bodie PA-C 04/13/2022   Cc: [name], NP

## 2022-04-13 NOTE — Patient Instructions (Addendum)
Call [facility] supply or [facility] medical about ostomy supplies  You have been scheduled for an appointment with ____________ at [facility]. Your appointment is on _______ at _______. Please arrive at ________ for registration. Make certain to bring a list of current medications, including any over the counter medications or vitamins. Also bring your co-pay if you have one as well as your insurance cards. [facility] is located at [address], [address]. Should you need to reschedule your appointment, please contact them at [phone].   If you are age 66 or older, your body mass index should be between 23-30. Your Body mass index is 28.38 kg/m. If this is out of the aforementioned range listed, please consider follow up with your Primary Care Provider.  If you are age 49 or younger, your body mass index should be between 19-25. Your Body mass index is 28.38 kg/m. If this is out of the aformentioned range listed, please consider follow up with your Primary Care Provider.   ________________________________________________________  The [facility] providers would like to encourage you to use [facility] to communicate with providers for non-urgent requests or questions.  Due to long hold times on the telephone, sending your provider a message by [facility] may be a faster and more efficient way to get a response.  Please allow 48 business hours for a response.  Please remember that this is for non-urgent requests.   You have been scheduled for a colonoscopy. Please follow written instructions given to you at your visit today.  Please pick up your prep supplies at the pharmacy within the next 1-3 days. If you use inhalers (even only as needed), please bring them with you on the day of your procedure.   Due to recent changes in healthcare laws, you may see the results of your imaging and laboratory studies on MyChart before your provider has had a chance to  review them.  We understand that in some cases there may be results that are confusing or concerning to you. Not all laboratory results come back in the same time frame and the provider may be waiting for multiple results in order to interpret others.  Please give [place] 48 hours in order for your provider to thoroughly review all the results before contacting the office for clarification of your results.    Thank you for entrusting me with your care and choosing [facility].  Amy Esterwood PA-C

## 2022-04-19 ENCOUNTER — Other Ambulatory Visit (HOSPITAL_COMMUNITY): Payer: Self-pay | Admitting: Surgery

## 2022-04-19 DIAGNOSIS — Z932 Ileostomy status: Secondary | ICD-10-CM

## 2022-04-23 ENCOUNTER — Ambulatory Visit (HOSPITAL_COMMUNITY)
Admission: RE | Admit: 2022-04-23 | Discharge: 2022-04-23 | Disposition: A | Discharge status: Home / Self Care | Payer: Medicare HMO | Source: Ambulatory Visit | Attending: Surgery | Admitting: Surgery

## 2022-04-23 DIAGNOSIS — Z932 Ileostomy status: Secondary | ICD-10-CM

## 2022-04-23 MED ORDER — IOHEXOL 300 MG/ML  SOLN
300.0000 mL | Freq: Once | INTRAMUSCULAR | Status: AC | PRN
  Administered 2022-04-23: 300 mL

## 2022-04-28 ENCOUNTER — Other Ambulatory Visit: Payer: Self-pay

## 2022-05-14 ENCOUNTER — Inpatient Hospital Stay: Payer: Medicare PPO

## 2022-05-14 ENCOUNTER — Inpatient Hospital Stay: Payer: Medicare PPO | Attending: Oncology | Admitting: Oncology

## 2022-05-14 VITALS — BP 134/81 | HR 79 | Temp 98.1°F | Resp 18 | Ht 71.0 in | Wt 202.2 lb

## 2022-05-14 DIAGNOSIS — E1122 Type 2 diabetes mellitus with diabetic chronic kidney disease: Secondary | ICD-10-CM | POA: Diagnosis not present

## 2022-05-14 DIAGNOSIS — Z803 Family history of malignant neoplasm of breast: Secondary | ICD-10-CM | POA: Insufficient documentation

## 2022-05-14 DIAGNOSIS — Z930 Tracheostomy status: Secondary | ICD-10-CM | POA: Insufficient documentation

## 2022-05-14 DIAGNOSIS — N189 Chronic kidney disease, unspecified: Secondary | ICD-10-CM | POA: Diagnosis not present

## 2022-05-14 DIAGNOSIS — E1140 Type 2 diabetes mellitus with diabetic neuropathy, unspecified: Secondary | ICD-10-CM | POA: Diagnosis not present

## 2022-05-14 DIAGNOSIS — Z8042 Family history of malignant neoplasm of prostate: Secondary | ICD-10-CM | POA: Insufficient documentation

## 2022-05-14 DIAGNOSIS — Z86718 Personal history of other venous thrombosis and embolism: Secondary | ICD-10-CM | POA: Insufficient documentation

## 2022-05-14 DIAGNOSIS — Z85038 Personal history of other malignant neoplasm of large intestine: Secondary | ICD-10-CM | POA: Insufficient documentation

## 2022-05-14 DIAGNOSIS — Z7901 Long term (current) use of anticoagulants: Secondary | ICD-10-CM | POA: Diagnosis not present

## 2022-05-14 DIAGNOSIS — C184 Malignant neoplasm of transverse colon: Secondary | ICD-10-CM | POA: Diagnosis not present

## 2022-05-14 DIAGNOSIS — G40909 Epilepsy, unspecified, not intractable, without status epilepticus: Secondary | ICD-10-CM | POA: Diagnosis not present

## 2022-05-14 DIAGNOSIS — Z95828 Presence of other vascular implants and grafts: Secondary | ICD-10-CM

## 2022-05-14 DIAGNOSIS — I129 Hypertensive chronic kidney disease with stage 1 through stage 4 chronic kidney disease, or unspecified chronic kidney disease: Secondary | ICD-10-CM | POA: Diagnosis not present

## 2022-05-14 LAB — CEA (ACCESS): CEA (CHCC): 1.68 ng/mL (ref 0.00–5.00)

## 2022-05-14 MED ORDER — SODIUM CHLORIDE 0.9% FLUSH
10.0000 mL | Freq: Once | INTRAVENOUS | Status: AC
  Administered 2022-05-14: 10 mL via INTRAVENOUS

## 2022-05-14 MED ORDER — HEPARIN SOD (PORK) LOCK FLUSH 100 UNIT/ML IV SOLN
500.0000 [IU] | Freq: Once | INTRAVENOUS | Status: AC
  Administered 2022-05-14: 500 [IU] via INTRAVENOUS

## 2022-05-14 NOTE — Patient Instructions (Signed)

## 2022-05-14 NOTE — Progress Notes (Signed)
[facility] OFFICE PROGRESS NOTE   Diagnosis: Colon cancer  INTERVAL HISTORY:   Thomas [name] returns as scheduled.  He feels well.  Good appetite.  He empties the ostomy approximately 3 times per day.  He is scheduled for colonoscopy 05/26/2022 and follow-up with Dr.Lovick 05/27/2022.  He would like to have the Port-A-Cath removed at the time of the ostomy reversal.  Objective:  Vital signs in last 24 hours:  Blood pressure 134/81, pulse 79, temperature 98.1 F (36.7 C), temperature source Oral, resp. rate 18, height '5\' 11"'$  (1.803 m), weight 202 lb 3.2 oz (91.7 kg), SpO2 100 %.    Lymphatics: No cervical, supraclavicular, axillary, or inguinal nodes Resp: Lungs clear bilaterally Cardio: Regular rate and rhythm, 2/6 systolic murmur GI: No hepatosplenomegaly, right abdomen colostomy, nontender, no mass Vascular: No leg edema  Portacath/PICC-without erythema  Lab Results:  Lab Results  Component Value Date   WBC 9.9 09/30/2021   HGB 13.9 09/30/2021   HCT 40.4 09/30/2021   MCV 74.8 (L) 09/30/2021   PLT 305 09/30/2021   NEUTROABS 7.8 (H) 09/29/2021    CMP  Lab Results  Component Value Date   NA 139 10/09/2021   K 3.9 10/09/2021   CL 108 10/09/2021   CO2 22 10/09/2021   GLUCOSE 114 (H) 10/09/2021   BUN 37 (H) 10/09/2021   CREATININE 1.80 (H) 10/09/2021   CALCIUM 9.6 10/09/2021   PROT 8.1 09/29/2021   ALBUMIN 4.0 09/29/2021   AST 29 09/29/2021   ALT 47 (H) 09/29/2021   ALKPHOS 99 09/29/2021   BILITOT 1.1 09/29/2021   GFRNONAA 42 (L) 10/09/2021   GFRAA >60 04/01/2017    Lab Results  Component Value Date   CEA1 1.7 09/05/2020   CEA 1.68 05/14/2022     Medications: I have reviewed the patient's current medications.   Assessment/Plan: Transverse colon cancer, stage IIIb (T3N1a), status post a partial transverse colectomy and end colostomy 09/10/2020 CT abdomen/pelvis 09/04/2020- possible transverse colon mass with small regional lymph  nodes Colonoscopy 09/05/2020- obstructing transverse colon mass-biopsy invasive adenocarcinoma, mass could not be passed CT chest 09/09/2020-no evidence of metastatic disease, 3 x 2 mm subpleural nodule in the right upper lobe likely benign subpleural lymph node Partial transverse colectomy 09/10/2020, transverse colon tumor, no lymphovascular perineural invasion, 1/19 lymph nodes positive, negative margins, MSI stable, no loss of mismatch repair protein expression Cycle 1 CAPOX 10/08/2020, oxaliplatin dose reduced to 100 mg per metered squared secondary to renal insufficiency Cycle 2 CAPOX 10/29/2020 Cycle 3 CAPOX 11/19/2020, capecitabine dose reduced secondary to hand/foot syndrome, oxaliplatin dose reduced secondary to renal failure and poor performance status CT chest/abdomen/pelvis without contrast 12/01/2020-no acute abnormality in the chest, abdomen, or pelvis.  No evidence of obstruction. CTs 03/05/2022-no evidence of recurrent disease Resection of a frontal meningioma 09/01/2016 Diabetes Rectal polyp- tubular adenoma on colonoscopy 09/05/2020 Hypertension Diabetic neuropathy Epilepsy Family history of breast and prostate cancer Admission with acute renal failure 10/01/2020-secondary to lack of oral intake and lisinopril, improved with intravenous hydration and holding lisinopril Admission 12/01/2020-sepsis, diarrhea HHS, A. fib with RVR, abdominal pain 11.  Respiratory failure requiring intubation 12/06/2020 Tracheostomy 12/18/2020 12.  Progressive renal failure-CRRT started 12/06/2020, discontinued after 12/16/2020, hemodialysis initiated 13.  Thrombocytopenia secondary to chemotherapy and sepsis, heparin-induced platelet antibody and SRA negative-resolved 14.  Right lower extremity DVT diagnosed 12/12/2020-on apixaban  15.  Anemia secondary to chronic disease, renal failure, and phlebotomy 16.  Admission 09/29/2021 with rotavirus      Disposition:  Thomas [name] remains in clinical remission from colon  cancer.  He is scheduled for colonoscopy and follow-up with Dr. Bobbye Morton in a few weeks.  I will recommend Dr. Bobbye Morton remove the Port-A-Cath at the time of the colostomy reversal.  Thomas [name] will return for an office visit and CEA in 4 months.  We will plan for surveillance imaging in October.  Betsy Coder, MD  05/14/2022  11:27 AM

## 2022-05-19 ENCOUNTER — Telehealth: Payer: Self-pay | Admitting: *Deleted

## 2022-05-19 ENCOUNTER — Encounter: Payer: Self-pay | Admitting: Internal Medicine

## 2022-05-19 ENCOUNTER — Other Ambulatory Visit (HOSPITAL_COMMUNITY): Payer: Self-pay

## 2022-05-19 NOTE — Telephone Encounter (Signed)
Returned Mr. Scarbrough's call for his CEA results. He says he has been locked out of his MyChart. Provided him with normal CEA and gave phone # for MyChart Helpdesk to call to get back on system.

## 2022-05-26 ENCOUNTER — Encounter: Payer: Medicare HMO | Admitting: Internal Medicine

## 2022-05-26 ENCOUNTER — Ambulatory Visit (AMBULATORY_SURGERY_CENTER): Payer: Medicare PPO | Admitting: Internal Medicine

## 2022-05-26 ENCOUNTER — Encounter: Payer: Self-pay | Admitting: Internal Medicine

## 2022-05-26 VITALS — BP 113/69 | HR 63 | Temp 98.4°F | Resp 17 | Ht 71.0 in | Wt 203.0 lb

## 2022-05-26 DIAGNOSIS — Z08 Encounter for follow-up examination after completed treatment for malignant neoplasm: Secondary | ICD-10-CM | POA: Diagnosis not present

## 2022-05-26 DIAGNOSIS — Z85038 Personal history of other malignant neoplasm of large intestine: Secondary | ICD-10-CM | POA: Diagnosis not present

## 2022-05-26 MED ORDER — SODIUM CHLORIDE 0.9 % IV SOLN: 500.0000 mL | Freq: Once | INTRAVENOUS | Status: DC

## 2022-05-26 NOTE — Progress Notes (Signed)
Subjective:      Subjective  Patient ID: Thomas [name], male    DOB: [birth year], 67 y.o.   MRN: [id number]   HPI Thomas [name] is a pleasant 67 year old male, established with Dr. Henrene [name] who comes in today to discuss follow-up colonoscopy.  He was initially seen in April 2002 during hospitalization and had Colonoscopy at that time per Dr. Henrene [name] that showed a nearly obstructing colonic mass in the transverse colon, he had one 6 mm polyp removed from the rectum and was noted to have a few diverticuli in the left colon.  Path was consistent with an adenocarcinoma and he underwent exploratory lap, mobilization of splenic flexure and transverse colectomy and end colostomy on 09/10/2020.  He was discharged home on 09/17/2020 then  had re admission 7/24- 03/12/21.  He had undergone 3 cycles of chemotherapy prior to this admission, felt to have sapacitabine toxicity, severe diarrhea, high ostomy output and acute renal failure.  He also developed metabolic encephalopathy, hypoxia, aspiration pneumonia required prolonged intubation and pressor support.  He eventually recuperated and has been doing okay over the past 1 year. His disease was determined to be stage IIIb and he has been determined to be in remission from an oncology standpoint.  He is hoping to have his ostomy reversed per Dr. Bobbye [name] who did his surgery and also his port. He is not having any issues with the ostomy, has no complaints of abdominal pain, no melena or hematochezia.  Appetite has been good and he has gained weight over the past year. Other medical issues include hypertension, history of A-fib with RVR, remote CVA, adult onset diabetes mellitus with neuropathy, history of duodenal ulcer diagnosed while hospitalized September 2022 during that prolonged stay, H. pylori negative.  Also had grade C esophagitis. He also had a DVT August 2022 was anticoagulated for 6 months then discontinued.   Review of Systems Pertinent positive and negative review of systems  were noted in the above HPI section.  All other review of systems was otherwise negative.        Outpatient Encounter Medications as of 04/13/2022  Medication Sig   acetaminophen (TYLENOL) 325 MG tablet Take 2 tablets (650 mg total) by mouth every 6 (six) hours as needed for mild pain or fever.   allopurinol (ZYLOPRIM) 100 MG tablet Take 100 mg by mouth daily.   amLODipine (NORVASC) 10 MG tablet Take 10 mg by mouth daily.   atorvastatin (LIPITOR) 20 MG tablet Take 20 mg by mouth daily at 6 PM.   feeding supplement (ENSURE ENLIVE / ENSURE PLUS) LIQD Take 237 mLs by mouth 3 (three) times daily between meals.   ferrous sulfate 325 (65 FE) MG tablet Take 325 mg by mouth daily with breakfast.   gabapentin (NEURONTIN) 100 MG capsule Take 1 capsule (100 mg total) by mouth at bedtime. (Patient taking differently: Take 100-200 mg by mouth See admin instructions. Take one tablet by mouth in the morning, then take two tablets in the afternoon)   levETIRAcetam (KEPPRA) 1000 MG tablet Take 1,000 mg by mouth 2 (two) times daily.   loperamide (IMODIUM) 2 MG capsule Take 1 capsule (2 mg total) by mouth as needed for diarrhea or loose stools.   Multiple Vitamin (MULTIVITAMIN WITH MINERALS) TABS tablet Take 1 tablet by mouth daily.   Na Sulfate-K Sulfate-Mg Sulf 17.5-3.13-1.6 GM/177ML SOLN Use as directed; may use generic; goodrx card if insurance will not cover generic   omeprazole (PRILOSEC) 40 MG capsule Take 1 capsule (40  mg total) by mouth daily.   VITAMIN D PO Take 50 mcg by mouth in the morning and at bedtime.   sertraline (ZOLOFT) 50 MG tablet Take 50 mg by mouth daily as needed. (Patient not taking: Reported on 04/13/2022)    No facility-administered encounter medications on file as of 04/13/2022.         Allergies  Allergen Reactions   Morphine And Related Other (See Comments)      Throat swelling (can tolerate oxycodone/hydrocodone/hydromorphone)        Patient Active Problem List    Diagnosis  Date Noted   Viral gastroenteritis 09/29/2021   Diabetic neuropathy ([facility]) 09/04/2021   Gastroesophageal reflux disease without esophagitis 03/18/2021   Epilepsy (HCC)     Hyperlipidemia     Peripheral neuropathy     Cerebral embolism with cerebral infarction 02/06/2021   Acute esophagitis     Duodenal ulcer disease     Palliative care by specialist     Acute respiratory failure with hypoxemia ([place]) 12/07/2020   Acute renal failure with acute cortical necrosis (HCC)     Shock ([place])     Acute metabolic encephalopathy 16/05/930   Malnutrition of moderate degree 12/03/2020   Atrial fibrillation with RVR (HCC)     Sepsis (HCC)     Adverse effect of chemotherapy 12/01/2020   Pressure injury of skin 12/01/2020   Acute renal failure superimposed on stage 2 chronic kidney disease ([place]) 10/01/2020   Hyponatremia 10/01/2020   Iron deficiency anemia due to chronic blood loss 10/01/2020   Colon cancer ([facility]) 09/23/2020   Colostomy in place [facility]) 09/11/2020   History of ischemic left MCA stroke 2015 09/11/2020   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     Rectal polyp     Diverticulosis of colon without hemorrhage     Abdominal pain 09/04/2020   Diabetic polyneuropathy associated with type 2 diabetes mellitus ([place]) 06/28/2018   Venous insufficiency of both lower extremities 06/28/2018   S/P craniotomy 09/01/2016   Meningioma ([place]) 09/01/2016   Trauma     Type 2 diabetes mellitus with diabetic nephropathy, without long-term current use of insulin ([place])     Hypertension     Acute encephalopathy 04/09/2014   Hyperammonemia ([place]) 04/09/2014    Social History         Socioeconomic History   Marital status: Married      Spouse name: Thomas [name]   Number of children: 4   Years of education: Not on file   Highest education level: Not on file  Occupational History   Occupation: Disabled  Tobacco Use   Smoking status: Former   Smokeless tobacco: Never    Tobacco comments:      quit smoking 35 yrs ago  Vaping Use   Vaping Use: Never used  Substance and Sexual Activity   Alcohol use: No   Drug use: No   Sexual activity: Not on file  Other Topics Concern   Not on file  Social History Narrative    Married to his wife, Jerlyn Ly with total of #4 children (#2 with current wife). Lives in home with his son and daughter-in-law.      Social Determinants of Health    Financial Resource Strain: Not on file  Food Insecurity: Not on file  Transportation Needs: Not on file  Physical Activity: Not on file  Stress: Not on file  Social Connections: Not on file  Intimate Partner Violence: Not on file  Mr. Droke family history includes Breast cancer in his maternal aunt and mother; Prostate cancer in his maternal uncle.         Objective:    Objective     Vitals:    04/13/22 1045  BP: 132/72  Pulse: 73      Physical Exam Well-developed well-nourished older AA male  in no acute distress.  Height, Weight,203  BMI 28.3 accompanied by wife   HEENT; nontraumatic normocephalic, EOMI, PE R LA, sclera anicteric. Oropharynx; not examined today Neck; supple, no JVD Cardiovascular; regular rate and rhythm with S1-S2, no murmur rub or gallop Pulmonary; Clear bilaterally Abdomen; soft, nontender, nondistended, no palpable mass or hepatosplenomegaly, bowel sounds are active, wide midline incisional scar, colostomy in the right mid quadrant Rectal; not done today Skin; benign exam, no jaundice rash or appreciable lesions Extremities; no clubbing cyanosis or edema skin warm and dry Neuro/Psych; alert and oriented x4, grossly nonfocal mood and affect appropriate            Assessment & Plan:    #1 67 year old African-American male with transverse colon adenocarcinoma stage IIIb, status post partial transverse colectomy and end colostomy 09/10/2020.\ He completed a course of chemotherapy Prolonged hospital admission July 2022 through  November 2947 complicated by acute renal failure possibly chemo related, sepsis, atrial fibrillation with RVR, respiratory failure with intubation, had metabolic encephalopathy-all resolved   #2 right lower extremity DVT August 2022, completed 6 months anticoagulation #3 adult onset diabetes mellitus with diabetic neuropathy #4 adenomatous colon polyp of the rectum also found at time of initial colonoscopy #5 seizure disorder -stable #6, chronic GERD, history of grade C esophagitis and a few shallow duodenal ulcers at EGD September 2022, H. pylori negative and maintained on omeprazole 40 mg   Plan; Patient will be scheduled for colonoscopy with Dr. Henrene [name] in the [facility].  Procedure was discussed in detail with the patient including indications risks and benefits and he is agreeable to proceed.   Patient has been having issues finding ostomy supplies, and he was given the names of 2 medical supply companies here in [place].   We will initiate referral back to Dr. Merilyn Baba [place] surgery, as he hopes to undergo colostomy reversal at some point within the next 3 to 4 months.         Amy S Esterwood PA-C 04/13/2022     Cc: [name], NP   As above recent complete H&P.  No interval clinical change.  Now for colonoscopy

## 2022-05-26 NOTE — Patient Instructions (Addendum)
Repeat Colonoscopy in 5 years.  YOU HAD AN ENDOSCOPIC PROCEDURE TODAY AT [facility]:   Refer to the procedure report that was given to you for any specific questions about what was found during the examination.  If the procedure report does not answer your questions, please call your gastroenterologist to clarify.  If you requested that your care partner not be given the details of your procedure findings, then the procedure report has been included in a sealed envelope for you to review at your convenience later.  YOU SHOULD EXPECT: Some feelings of bloating in the abdomen. Passage of more gas than usual.  Walking can help get rid of the air that was put into your GI tract during the procedure and reduce the bloating. If you had a lower endoscopy (such as a colonoscopy or flexible sigmoidoscopy) you may notice spotting of blood in your stool or on the toilet paper. If you underwent a bowel prep for your procedure, you may not have a normal bowel movement for a few days.  Please Note:  You might notice some irritation and congestion in your nose or some drainage.  This is from the oxygen used during your procedure.  There is no need for concern and it should clear up in a day or so.  SYMPTOMS TO REPORT IMMEDIATELY:  Following lower endoscopy (colonoscopy or flexible sigmoidoscopy):  Excessive amounts of blood in the stool  Significant tenderness or worsening of abdominal pains  Swelling of the abdomen that is new, acute  Fever of 100F or higher   For urgent or emergent issues, a gastroenterologist can be reached at any hour by calling [phone]. Do not use MyChart messaging for urgent concerns.    DIET:  We do recommend a small meal at first, but then you may proceed to your regular diet.  Drink plenty of fluids but you should avoid alcoholic beverages for 24 hours.  ACTIVITY:  You should plan to take it easy for the rest of today and you should NOT DRIVE or use heavy  machinery until tomorrow (because of the sedation medicines used during the test).    FOLLOW UP: Our staff will call the number listed on your records the next business day following your procedure.  We will call around 7:15- 8:00 am to check on you and address any questions or concerns that you may have regarding the information given to you following your procedure. If we do not reach you, we will leave a message.     If any biopsies were taken you will be contacted by phone or by letter within the next 1-3 weeks.  Please call us at [phone] if you have not heard about the biopsies in 3 weeks.    SIGNATURES/CONFIDENTIALITY: You and/or your care partner have signed paperwork which will be entered into your electronic medical record.  These signatures attest to the fact that that the information above on your After Visit Summary has been reviewed and is understood.  Full responsibility of the confidentiality of this discharge information lies with you and/or your care-partner.

## 2022-05-26 NOTE — Progress Notes (Signed)
PT taken to PACU. Monitors in place. VSS. Report given to RN. 

## 2022-05-26 NOTE — Op Note (Signed)
[facility] Patient Name: Thomas [name] Procedure Date: 05/26/2022 9:51 AM MRN: [id number] Endoscopist: Docia Chuck. Henrene Pastor , MD, [id number] Age: 67 Referring MD:  Date of Birth: [birth year] Gender: Male Account #: [id number] Procedure:                Colonoscopy Indications:              High risk colon cancer surveillance: Personal                            history of colon cancer dx 08-2020, s/p resection                            with colostomy 09-2020. BE 04-23-22 without mass or                            stricture Medicines:                Monitored Anesthesia Care Procedure:                Pre-Anesthesia Assessment:                           - Prior to the procedure, a History and Physical                            was performed, and patient medications and                            allergies were reviewed. The patient's tolerance of                            previous anesthesia was also reviewed. The risks                            and benefits of the procedure and the sedation                            options and risks were discussed with the patient.                            All questions were answered, and informed consent                            was obtained. Prior Anticoagulants: The patient has                            taken no anticoagulant or antiplatelet agents. ASA                            Grade Assessment: II - A patient with mild systemic                            disease. After reviewing the risks and benefits,  the patient was deemed in satisfactory condition to                            undergo the procedure.                           After obtaining informed consent, the colonoscope                            was passed under direct vision. Throughout the                            procedure, the patient's blood pressure, pulse, and                            oxygen saturations were monitored continuously. The                             [id number] PCF-H190TL Slim SB Colonoscope was introduced                            through the ascending colostomy and advanced to the                            the cecum, identified by appendiceal orifice and                            ileocecal valve. The colonoscopy was performed                            without difficulty. The patient tolerated the                            procedure well. The quality of the bowel                            preparation was excellent. Suprep, split Scope In: 10:28:12 AM Scope Out: 10:34:43 AM Scope Withdrawal Time: 0 hours 5 minutes 26 seconds  Total Procedure Duration: 0 hours 6 minutes 31 seconds  Findings:                 The ostomy was entered. There was a short segment                            of most proximal ascending colon and cecum (maybe                            8-10 cm in total length). These were unremarkable,                            as was the ICV . Complications:            No immediate complications. Estimated Blood Loss:     Estimated blood loss: none. Impression:               -  The ostomy was entered. There was a short segment                            of most proximal ascending colon and cecum (maybe                            8-10 cm in total length). These were unremarkable,                            as was the ICV . Marland Kitchen                           - No specimens collected. Recommendation:           - Patient has a contact number available for                            emergencies. The signs and symptoms of potential                            delayed complications were discussed with the                            patient. Return to normal activities tomorrow.                            Written discharge instructions were provided to the                            patient.                           - Resume previous diet.                           - Continue present medications.                            - Return to your surgeon                           - Repeat colonoscopy 5 years Docia Chuck. Henrene Pastor, MD 05/26/2022 11:03:54 AM This report has been signed electronically.

## 2022-05-27 ENCOUNTER — Telehealth: Payer: Self-pay | Admitting: *Deleted

## 2022-05-27 NOTE — Telephone Encounter (Signed)
  Follow up Call-     05/26/2022    9:48 AM  Call back number  Post procedure Call Back phone  # [phone]  Permission to leave phone message Yes     Patient questions:  Do you have a fever, pain , or abdominal swelling? No. Pain Score  0 *  Have you tolerated food without any problems? Yes.    Have you been able to return to your normal activities? Yes.    Do you have any questions about your discharge instructions: Diet   No. Medications  No. Follow up visit  No.  Do you have questions or concerns about your Care? No.  Actions: * If pain score is 4 or above: No action needed, pain <4.

## 2022-06-07 ENCOUNTER — Ambulatory Visit: Payer: Self-pay | Admitting: Surgery

## 2022-06-07 DIAGNOSIS — Z86011 Personal history of benign neoplasm of the brain: Secondary | ICD-10-CM | POA: Insufficient documentation

## 2022-06-07 DIAGNOSIS — K432 Incisional hernia without obstruction or gangrene: Secondary | ICD-10-CM | POA: Insufficient documentation

## 2022-06-07 DIAGNOSIS — R739 Hyperglycemia, unspecified: Secondary | ICD-10-CM

## 2022-06-14 NOTE — Progress Notes (Signed)
Anesthesia Review:  PCP: Cardiologist : Oncology- DR Benay Spice  Chest x-ray : CT chest- 03/08/22  EKG : 09/30/21  Echo : 12/02/20  Carotids- 02/09/21  Stress test: Cardiac Cath :  Activity level:  Sleep Study/ CPAP : Fasting Blood Sugar :      / Checks Blood Sugar -- times a day:   Blood Thinner/ Instructions /Last Dose: ASA / Instructions/ Last Dose :

## 2022-06-15 NOTE — Patient Instructions (Signed)
SURGICAL WAITING ROOM VISITATION  Patients having surgery or a procedure may have no more than 2 support people in the waiting area - these visitors may rotate.    Children under the age of 67 must have an adult with them who is not the patient.  Due to an increase in RSV and influenza rates and associated hospitalizations, children ages 42 and under may not visit patients in [place].  If the patient needs to stay at the hospital during part of their recovery, the visitor guidelines for inpatient rooms apply. Pre-op nurse will coordinate an appropriate time for 1 support person to accompany patient in pre-op.  This support person may not rotate.    Please refer to the [facility] website for the visitor guidelines for Inpatients (after your surgery is over and you are in a regular room).       Your procedure is scheduled on:  07/01/22    Report to [facility] Main Entrance    Report to admitting at   1030AM   Call this number if you have problems the morning of surgery [phone]   Do not eat food :After Midnight.   After Midnight you may have the following liquids until __ 0930____ AM  DAY OF SURGERY  Water Non-Citrus Juices (without pulp, NO RED-Apple, White grape, White cranberry) Black Coffee (NO MILK/CREAM OR CREAMERS, sugar ok)  Clear Tea (NO MILK/CREAM OR CREAMERS, sugar ok) regular and decaf                             Plain Jell-O (NO RED)                                           Fruit ices (not with fruit pulp, NO RED)                                     Popsicles (NO RED)                                                               Sports drinks like Gatorade (NO RED)              Drink 2 Ensure/G2 drinks AT 10:00 PM the night before surgery.        The day of surgery:  Drink ONE (1) Pre-Surgery Clear Ensure or G2 at   0930AM ( have completed by )  the morning of surgery. Drink in one sitting. Do not sip.  This drink was given to you  during your hospital  pre-op appointment visit. Nothing else to drink after completing the  Pre-Surgery Clear Ensure or G2.          If you have questions, please contact your surgeon's office.   FOLLOW BOWEL PREP AND ANY ADDITIONAL PRE OP INSTRUCTIONS YOU RECEIVED FROM YOUR SURGEON'S OFFICE!!!     Oral Hygiene is also important to reduce your risk of infection.  Remember - BRUSH YOUR TEETH THE MORNING OF SURGERY WITH YOUR REGULAR TOOTHPASTE  DENTURES WILL BE REMOVED PRIOR TO SURGERY PLEASE DO NOT APPLY "Poly grip" OR ADHESIVES!!!   Do NOT smoke after Midnight   Take these medicines the morning of surgery with A SIP OF WATER:  allopurinol, amlodipine, keppra, omeprazole   DO NOT TAKE ANY ORAL DIABETIC MEDICATIONS DAY OF YOUR SURGERY  Bring CPAP mask and tubing day of surgery.                              You may not have any metal on your body including hair pins, jewelry, and body piercing             Do not wear make-up, lotions, powders, perfumes/cologne, or deodorant  Do not wear nail polish including gel and S&S, artificial/acrylic nails, or any other type of covering on natural nails including finger and toenails. If you have artificial nails, gel coating, etc. that needs to be removed by a nail salon please have this removed prior to surgery or surgery may need to be canceled/ delayed if the surgeon/ anesthesia feels like they are unable to be safely monitored.   Do not shave  48 hours prior to surgery.               Men may shave face and neck.   Do not bring valuables to the hospital. [place].   Contacts, glasses, dentures or bridgework may not be worn into surgery.   Bring small overnight bag day of surgery.   DO NOT [place]. PHARMACY WILL DISPENSE MEDICATIONS LISTED ON YOUR MEDICATION LIST TO YOU DURING YOUR ADMISSION [place]!    Patients discharged on the day of surgery will not be allowed to drive home.  Someone NEEDS to stay with you for the first 24 hours after anesthesia.   Special Instructions: Bring a copy of your healthcare power of attorney and living will documents the day of surgery if you haven't scanned them before.              Please read over the following fact sheets you were given: IF [place] [phone]   If you received a COVID test during your pre-op visit  it is requested that you wear a mask when out in public, stay away from anyone that may not be feeling well and notify your surgeon if you develop symptoms. If you test positive for Covid or have been in contact with anyone that has tested positive in the last 10 days please notify you surgeon.    [place] - Preparing for Surgery Before surgery, you can play an important role.  Because skin is not sterile, your skin needs to be as free of germs as possible.  You can reduce the number of germs on your skin by washing with CHG (chlorahexidine gluconate) soap before surgery.  CHG is an antiseptic cleaner which kills germs and bonds with the skin to continue killing germs even after washing. Please DO NOT use if you have an allergy to CHG or antibacterial soaps.  If your skin becomes reddened/irritated stop using the CHG and inform your nurse when you arrive at Short Stay. Do not shave (including legs  and underarms) for at least 48 hours prior to the first CHG shower.  You may shave your face/neck. Please follow these instructions carefully:  1.  Shower with CHG Soap the night before surgery and the  morning of Surgery.  2.  If you choose to wash your hair, wash your hair first as usual with your  normal  shampoo.  3.  After you shampoo, rinse your hair and body thoroughly to remove the  shampoo.                           4.  Use CHG as you would any other liquid soap.  You can apply  chg directly  to the skin and wash                       Gently with a scrungie or clean washcloth.  5.  Apply the CHG Soap to your body ONLY FROM THE NECK DOWN.   Do not use on face/ open                           Wound or open sores. Avoid contact with eyes, ears mouth and genitals (private parts).                       Wash face,  Genitals (private parts) with your normal soap.             6.  Wash thoroughly, paying special attention to the area where your surgery  will be performed.  7.  Thoroughly rinse your body with warm water from the neck down.  8.  DO NOT shower/wash with your normal soap after using and rinsing off  the CHG Soap.                9.  Pat yourself dry with a clean towel.            10.  Wear clean pajamas.            11.  Place clean sheets on your bed the night of your first shower and do not  sleep with pets. Day of Surgery : Do not apply any lotions/deodorants the morning of surgery.  Please wear clean clothes to the hospital/surgery center.  FAILURE TO FOLLOW THESE INSTRUCTIONS MAY RESULT IN THE CANCELLATION OF YOUR SURGERY PATIENT SIGNATURE_________________________________  NURSE SIGNATURE__________________________________  ________________________________________________________________________

## 2022-06-16 ENCOUNTER — Encounter (HOSPITAL_COMMUNITY): Payer: Self-pay

## 2022-06-16 ENCOUNTER — Other Ambulatory Visit: Payer: Self-pay

## 2022-06-16 ENCOUNTER — Encounter (HOSPITAL_COMMUNITY)
Admission: RE | Admit: 2022-06-16 | Discharge: 2022-06-16 | Disposition: A | Discharge status: Home / Self Care | Payer: Medicare PPO | Source: Ambulatory Visit | Attending: Surgery | Admitting: Surgery

## 2022-06-16 VITALS — BP 115/65 | HR 75 | Temp 98.4°F | Resp 16 | Ht 68.0 in | Wt 192.0 lb

## 2022-06-16 DIAGNOSIS — Z01818 Encounter for other preprocedural examination: Secondary | ICD-10-CM | POA: Diagnosis not present

## 2022-06-16 DIAGNOSIS — R739 Hyperglycemia, unspecified: Secondary | ICD-10-CM | POA: Insufficient documentation

## 2022-06-16 HISTORY — DX: Depression, unspecified: F32.A

## 2022-06-16 HISTORY — DX: Cardiac murmur, unspecified: R01.1

## 2022-06-16 HISTORY — DX: Anxiety disorder, unspecified: F41.9

## 2022-06-16 HISTORY — DX: Chronic kidney disease, unspecified: N18.9

## 2022-06-16 LAB — BASIC METABOLIC PANEL
Anion gap: 9 (ref 5–15)
BUN: 32 mg/dL — ABNORMAL HIGH (ref 8–23)
CO2: 24 mmol/L (ref 22–32)
Calcium: 9.1 mg/dL (ref 8.9–10.3)
Chloride: 106 mmol/L (ref 98–111)
Creatinine, Ser: 1.86 mg/dL — ABNORMAL HIGH (ref 0.61–1.24)
GFR, Estimated: 40 mL/min — ABNORMAL LOW (ref >60)
Glucose, Bld: 76 mg/dL (ref 70–99)
Potassium: 4.5 mmol/L (ref 3.5–5.1)
Sodium: 139 mmol/L (ref 135–145)

## 2022-06-16 LAB — CBC
HCT: 36.7 % — ABNORMAL LOW (ref 39.0–52.0)
Hemoglobin: 12.5 g/dL — ABNORMAL LOW (ref 13.0–17.0)
MCH: 26.3 pg (ref 26.0–34.0)
MCHC: 34.1 g/dL (ref 30.0–36.0)
MCV: 77.3 fL — ABNORMAL LOW (ref 80.0–100.0)
Platelets: 294 10*3/uL (ref 150–400)
RBC: 4.75 MIL/uL (ref 4.22–5.81)
RDW: 14.7 % (ref 11.5–15.5)
WBC: 10.9 10*3/uL — ABNORMAL HIGH (ref 4.0–10.5)
nRBC: 0 % (ref 0.0–0.2)

## 2022-06-16 LAB — GLUCOSE, CAPILLARY: Glucose-Capillary: 76 mg/dL (ref 70–99)

## 2022-06-16 LAB — HEMOGLOBIN A1C
Hgb A1c MFr Bld: 5.2 % (ref 4.8–5.6)
Mean Plasma Glucose: 102.54 mg/dL

## 2022-06-29 NOTE — Progress Notes (Signed)
Anesthesia Chart Review   Case: [id number] Date/Time: 07/01/22 1215   Procedures:      ROBOTIC OSTOMY TAKEDOWN WITH INTRACORPOREAL ANASTOMOSIS     LYSIS OF ADHESION     PROCTOSCOPY   Anesthesia type: General   Pre-op diagnosis: COLOSTOMY FOR COLON RESECTION   Location: [facility] ROOM 02 / [facility]   Surgeons: Michael Boston, MD       DISCUSSION: 67 y.o. former smoker with h/o sleep apnea, HTN, DM II, CKD (followed by [facility]), seizures well controlled on Keppra, stroke, frontal brain tumor s/p resection, colon cancer scheduled for above procedure 07/01/22 with Dr. Michael Boston.   Pt s/p tracheostomy 01/05/2021, now removed.    Pt last seen by oncology 05/14/2022. Per notes in clinical remission from colon cancer. S/p colostomy and port-a-cath insertion, scheduled for takedown and removal.   VS: There were no vitals taken for this visit.  PROVIDERS: [name], NP is PCP    LABS: Labs reviewed: Acceptable for surgery. (all labs ordered are listed, but only abnormal results are displayed)  Labs Reviewed - No data to display   IMAGES:   EKG:   CV: Echo 12/02/2020 1. Mild intracavitary gradient. Peak velocity 1.37 m/s. Peak gradient 7.5  mmHg. Left ventricular ejection fraction, by estimation, is 60 to 65%. The  left ventricle has normal function. The left ventricle has no regional  wall motion abnormalities. There  is moderate concentric left ventricular hypertrophy. Left ventricular  diastolic parameters are consistent with Grade I diastolic dysfunction  (impaired relaxation).   2. Right ventricular systolic function is normal. The right ventricular  size is normal. There is normal pulmonary artery systolic pressure.   3. The mitral valve is normal in structure. No evidence of mitral valve  regurgitation. No evidence of mitral stenosis.   4. The aortic valve is calcified. There is moderate calcification of the  aortic valve. Aortic valve regurgitation is not visualized.  No aortic  stenosis is present.   5. The inferior vena cava is normal in size with greater than 50%  respiratory variability, suggesting right atrial pressure of 3 mmHg.  Past Medical History:  Diagnosis Date   Acute ischemic left MCA stroke ([place]) 04/09/2014   Acute respiratory failure with hypoxia ([facility]) 04/09/2014   Anxiety    Brain tumor ([place])    frontal   Chronic kidney disease    Colon cancer ([place])    Confusion    occasionally   Depression    Diabetes mellitus without complication ([place])    takes Metformin daily.   Dizziness    Epilepsy ([place])    takes Keppra daily   GERD (gastroesophageal reflux disease)    takes Omeprazole daily   Headache    Heart murmur    Hyperlipidemia    takes Atorvastatin daily   Hypertension    takes Lotrel daily   Peripheral edema    takes Lasix daily   Peripheral neuropathy    takes Gabapentin daily   Seizures ([place])    Sleep apnea    no cpap   Urinary frequency     Past Surgical History:  Procedure Laterality Date   BIOPSY  09/05/2020   Procedure: BIOPSY;  Surgeon: Irene Shipper, MD;  [facility] ENDOSCOPY;  Service: Endoscopy;;   BIOPSY  02/06/2021   Procedure: BIOPSY;  Surgeon: Jerene Bears, MD;  Location: [place];  Service: Gastroenterology;;   CARDIAC CATHETERIZATION  7 yrs ago   COLONOSCOPY WITH PROPOFOL N/A 09/05/2020  Procedure: COLONOSCOPY WITH PROPOFOL;  Surgeon: Irene Shipper, MD;  Location: [facility] ENDOSCOPY;  Service: Endoscopy;  Laterality: N/A;   CRANIOTOMY N/A 09/01/2016   Procedure: CRANIOTOMY TUMOR  LEFT PTERIONAL;  Surgeon: Ashok Pall, MD;  Location: [place];  Service: Neurosurgery;  Laterality: N/A;   cyst removed from chest      as a child   ESOPHAGOGASTRODUODENOSCOPY (EGD) WITH PROPOFOL N/A 02/06/2021   Procedure: ESOPHAGOGASTRODUODENOSCOPY (EGD) WITH PROPOFOL;  Surgeon: Jerene Bears, MD;  Location: [facility] ENDOSCOPY;  Service: Gastroenterology;  Laterality: N/A;   IR FLUORO GUIDE CV LINE LEFT  12/22/2020   IR  GASTROSTOMY TUBE MOD SED  02/10/2021   IR GASTROSTOMY TUBE REMOVAL  04/15/2021   IR REMOVAL TUN CV CATH W/O FL  03/09/2021   IR US GUIDE VASC ACCESS LEFT  12/22/2020   NO PAST SURGERIES     PARTIAL COLECTOMY N/A 09/10/2020   Procedure: TRANSVERSE COLECTOMY;  Surgeon: [name], MD;  Location: [place];  Service: General;  Laterality: N/A;   POLYPECTOMY  09/05/2020   Procedure: POLYPECTOMY;  Surgeon: Irene Shipper, MD;  Location: [place];  Service: Endoscopy;;   PORTACATH PLACEMENT Right 10/01/2020   Procedure: INSERTION PORT-A-CATH;  Surgeon: [name], MD;  Location: [place];  Service: General;  Laterality: Right;   SUBMUCOSAL TATTOO INJECTION  09/05/2020   Procedure: SUBMUCOSAL TATTOO INJECTION;  Surgeon: Irene Shipper, MD;  Location: [facility] ENDOSCOPY;  Service: Endoscopy;;   TRACHEOSTOMY TUBE PLACEMENT N/A 01/05/2021   Procedure: TRACHEOSTOMY;  Surgeon: [name], MD;  Location: [place];  Service: ENT;  Laterality: N/A;    MEDICATIONS: No current facility-administered medications for this encounter.    acetaminophen (TYLENOL) 500 MG tablet   allopurinol (ZYLOPRIM) 100 MG tablet   amLODipine (NORVASC) 10 MG tablet   atorvastatin (LIPITOR) 20 MG tablet   Emollient (UDDERLY SMOOTH EX)   gabapentin (NEURONTIN) 100 MG capsule   levETIRAcetam (KEPPRA) 500 MG tablet   loperamide (IMODIUM) 2 MG capsule   LORazepam (ATIVAN) 0.5 MG tablet   omeprazole (PRILOSEC) 20 MG capsule   Na Sulfate-K Sulfate-Mg Sulf 17.5-3.13-1.6 GM/177ML SOLN    Hanh Kertesz Ward, PA-C [facility] Pre-Surgical Testing [phone]

## 2022-07-01 ENCOUNTER — Inpatient Hospital Stay (HOSPITAL_COMMUNITY): Payer: Medicare PPO | Admitting: Physician Assistant

## 2022-07-01 ENCOUNTER — Encounter (HOSPITAL_COMMUNITY): Payer: Self-pay | Admitting: Surgery

## 2022-07-01 ENCOUNTER — Encounter (HOSPITAL_COMMUNITY)
Admission: RE | Disposition: A | Discharge status: Home Health | Payer: Self-pay | Source: Home / Self Care | Attending: Surgery

## 2022-07-01 ENCOUNTER — Inpatient Hospital Stay (HOSPITAL_COMMUNITY)
Admission: RE | Admit: 2022-07-01 | Discharge: 2022-07-27 | DRG: 326 | Disposition: A | Discharge status: Home Health | Payer: Medicare PPO | Attending: Surgery | Admitting: Surgery

## 2022-07-01 ENCOUNTER — Other Ambulatory Visit: Payer: Self-pay

## 2022-07-01 ENCOUNTER — Other Ambulatory Visit (HOSPITAL_COMMUNITY): Payer: Self-pay

## 2022-07-01 DIAGNOSIS — K219 Gastro-esophageal reflux disease without esophagitis: Secondary | ICD-10-CM | POA: Diagnosis present

## 2022-07-01 DIAGNOSIS — C189 Malignant neoplasm of colon, unspecified: Secondary | ICD-10-CM | POA: Diagnosis present

## 2022-07-01 DIAGNOSIS — Z433 Encounter for attention to colostomy: Secondary | ICD-10-CM | POA: Diagnosis present

## 2022-07-01 DIAGNOSIS — E8809 Other disorders of plasma-protein metabolism, not elsewhere classified: Secondary | ICD-10-CM | POA: Diagnosis present

## 2022-07-01 DIAGNOSIS — C184 Malignant neoplasm of transverse colon: Secondary | ICD-10-CM | POA: Diagnosis present

## 2022-07-01 DIAGNOSIS — R410 Disorientation, unspecified: Secondary | ICD-10-CM

## 2022-07-01 DIAGNOSIS — J690 Pneumonitis due to inhalation of food and vomit: Secondary | ICD-10-CM | POA: Diagnosis not present

## 2022-07-01 DIAGNOSIS — E876 Hypokalemia: Secondary | ICD-10-CM | POA: Insufficient documentation

## 2022-07-01 DIAGNOSIS — G4701 Insomnia due to medical condition: Secondary | ICD-10-CM | POA: Diagnosis not present

## 2022-07-01 DIAGNOSIS — Z8673 Personal history of transient ischemic attack (TIA), and cerebral infarction without residual deficits: Secondary | ICD-10-CM

## 2022-07-01 DIAGNOSIS — T50995A Adverse effect of other drugs, medicaments and biological substances, initial encounter: Secondary | ICD-10-CM | POA: Diagnosis not present

## 2022-07-01 DIAGNOSIS — E440 Moderate protein-calorie malnutrition: Secondary | ICD-10-CM | POA: Diagnosis not present

## 2022-07-01 DIAGNOSIS — Z85048 Personal history of other malignant neoplasm of rectum, rectosigmoid junction, and anus: Secondary | ICD-10-CM | POA: Diagnosis not present

## 2022-07-01 DIAGNOSIS — G4730 Sleep apnea, unspecified: Secondary | ICD-10-CM | POA: Diagnosis present

## 2022-07-01 DIAGNOSIS — N1832 Chronic kidney disease, stage 3b: Secondary | ICD-10-CM | POA: Diagnosis present

## 2022-07-01 DIAGNOSIS — E1142 Type 2 diabetes mellitus with diabetic polyneuropathy: Secondary | ICD-10-CM | POA: Diagnosis present

## 2022-07-01 DIAGNOSIS — J90 Pleural effusion, not elsewhere classified: Secondary | ICD-10-CM | POA: Diagnosis not present

## 2022-07-01 DIAGNOSIS — E1165 Type 2 diabetes mellitus with hyperglycemia: Secondary | ICD-10-CM | POA: Diagnosis present

## 2022-07-01 DIAGNOSIS — Z87891 Personal history of nicotine dependence: Secondary | ICD-10-CM | POA: Diagnosis not present

## 2022-07-01 DIAGNOSIS — Q231 Congenital insufficiency of aortic valve: Secondary | ICD-10-CM

## 2022-07-01 DIAGNOSIS — Z7984 Long term (current) use of oral hypoglycemic drugs: Secondary | ICD-10-CM

## 2022-07-01 DIAGNOSIS — Z79899 Other long term (current) drug therapy: Secondary | ICD-10-CM

## 2022-07-01 DIAGNOSIS — D6489 Other specified anemias: Secondary | ICD-10-CM | POA: Diagnosis present

## 2022-07-01 DIAGNOSIS — K567 Ileus, unspecified: Secondary | ICD-10-CM | POA: Diagnosis not present

## 2022-07-01 DIAGNOSIS — R1310 Dysphagia, unspecified: Secondary | ICD-10-CM | POA: Diagnosis present

## 2022-07-01 DIAGNOSIS — E119 Type 2 diabetes mellitus without complications: Secondary | ICD-10-CM | POA: Diagnosis not present

## 2022-07-01 DIAGNOSIS — J9811 Atelectasis: Secondary | ICD-10-CM | POA: Diagnosis not present

## 2022-07-01 DIAGNOSIS — F419 Anxiety disorder, unspecified: Secondary | ICD-10-CM | POA: Diagnosis present

## 2022-07-01 DIAGNOSIS — D649 Anemia, unspecified: Secondary | ICD-10-CM | POA: Insufficient documentation

## 2022-07-01 DIAGNOSIS — E785 Hyperlipidemia, unspecified: Secondary | ICD-10-CM | POA: Diagnosis not present

## 2022-07-01 DIAGNOSIS — Y92239 Unspecified place in hospital as the place of occurrence of the external cause: Secondary | ICD-10-CM | POA: Diagnosis not present

## 2022-07-01 DIAGNOSIS — E1122 Type 2 diabetes mellitus with diabetic chronic kidney disease: Secondary | ICD-10-CM | POA: Diagnosis present

## 2022-07-01 DIAGNOSIS — T17928A Food in respiratory tract, part unspecified causing other injury, initial encounter: Secondary | ICD-10-CM | POA: Diagnosis not present

## 2022-07-01 DIAGNOSIS — Z9221 Personal history of antineoplastic chemotherapy: Secondary | ICD-10-CM

## 2022-07-01 DIAGNOSIS — K430 Incisional hernia with obstruction, without gangrene: Secondary | ICD-10-CM

## 2022-07-01 DIAGNOSIS — N183 Chronic kidney disease, stage 3 unspecified: Secondary | ICD-10-CM | POA: Diagnosis not present

## 2022-07-01 DIAGNOSIS — R011 Cardiac murmur, unspecified: Secondary | ICD-10-CM | POA: Diagnosis not present

## 2022-07-01 DIAGNOSIS — I129 Hypertensive chronic kidney disease with stage 1 through stage 4 chronic kidney disease, or unspecified chronic kidney disease: Secondary | ICD-10-CM | POA: Diagnosis present

## 2022-07-01 DIAGNOSIS — F28 Other psychotic disorder not due to a substance or known physiological condition: Secondary | ICD-10-CM | POA: Diagnosis not present

## 2022-07-01 DIAGNOSIS — D631 Anemia in chronic kidney disease: Secondary | ICD-10-CM | POA: Diagnosis present

## 2022-07-01 DIAGNOSIS — K660 Peritoneal adhesions (postprocedural) (postinfection): Secondary | ICD-10-CM | POA: Diagnosis present

## 2022-07-01 DIAGNOSIS — Z803 Family history of malignant neoplasm of breast: Secondary | ICD-10-CM

## 2022-07-01 DIAGNOSIS — K5730 Diverticulosis of large intestine without perforation or abscess without bleeding: Secondary | ICD-10-CM | POA: Diagnosis present

## 2022-07-01 DIAGNOSIS — I700 Atherosclerosis of aorta: Secondary | ICD-10-CM | POA: Diagnosis present

## 2022-07-01 DIAGNOSIS — R339 Retention of urine, unspecified: Secondary | ICD-10-CM | POA: Diagnosis not present

## 2022-07-01 DIAGNOSIS — E1140 Type 2 diabetes mellitus with diabetic neuropathy, unspecified: Secondary | ICD-10-CM | POA: Diagnosis present

## 2022-07-01 DIAGNOSIS — R0902 Hypoxemia: Secondary | ICD-10-CM | POA: Diagnosis not present

## 2022-07-01 DIAGNOSIS — D75839 Thrombocytosis, unspecified: Secondary | ICD-10-CM | POA: Diagnosis not present

## 2022-07-01 DIAGNOSIS — J9601 Acute respiratory failure with hypoxia: Secondary | ICD-10-CM | POA: Diagnosis not present

## 2022-07-01 DIAGNOSIS — I10 Essential (primary) hypertension: Secondary | ICD-10-CM | POA: Diagnosis present

## 2022-07-01 DIAGNOSIS — E669 Obesity, unspecified: Secondary | ICD-10-CM | POA: Diagnosis present

## 2022-07-01 DIAGNOSIS — Z8711 Personal history of peptic ulcer disease: Secondary | ICD-10-CM

## 2022-07-01 DIAGNOSIS — M62838 Other muscle spasm: Secondary | ICD-10-CM | POA: Diagnosis not present

## 2022-07-01 DIAGNOSIS — R5381 Other malaise: Secondary | ICD-10-CM | POA: Diagnosis not present

## 2022-07-01 DIAGNOSIS — F32A Depression, unspecified: Secondary | ICD-10-CM | POA: Diagnosis present

## 2022-07-01 DIAGNOSIS — J9819 Other pulmonary collapse: Secondary | ICD-10-CM | POA: Diagnosis not present

## 2022-07-01 DIAGNOSIS — F05 Delirium due to known physiological condition: Secondary | ICD-10-CM | POA: Diagnosis not present

## 2022-07-01 DIAGNOSIS — D62 Acute posthemorrhagic anemia: Secondary | ICD-10-CM | POA: Diagnosis not present

## 2022-07-01 DIAGNOSIS — Z86718 Personal history of other venous thrombosis and embolism: Secondary | ICD-10-CM

## 2022-07-01 DIAGNOSIS — R7401 Elevation of levels of liver transaminase levels: Secondary | ICD-10-CM | POA: Diagnosis not present

## 2022-07-01 DIAGNOSIS — G40909 Epilepsy, unspecified, not intractable, without status epilepticus: Secondary | ICD-10-CM | POA: Diagnosis present

## 2022-07-01 DIAGNOSIS — R739 Hyperglycemia, unspecified: Secondary | ICD-10-CM

## 2022-07-01 DIAGNOSIS — K432 Incisional hernia without obstruction or gangrene: Secondary | ICD-10-CM | POA: Diagnosis present

## 2022-07-01 DIAGNOSIS — J9809 Other diseases of bronchus, not elsewhere classified: Secondary | ICD-10-CM | POA: Diagnosis not present

## 2022-07-01 DIAGNOSIS — Z9889 Other specified postprocedural states: Secondary | ICD-10-CM

## 2022-07-01 DIAGNOSIS — Z86011 Personal history of benign neoplasm of the brain: Secondary | ICD-10-CM

## 2022-07-01 DIAGNOSIS — E1121 Type 2 diabetes mellitus with diabetic nephropathy: Secondary | ICD-10-CM | POA: Diagnosis not present

## 2022-07-01 DIAGNOSIS — Z8042 Family history of malignant neoplasm of prostate: Secondary | ICD-10-CM

## 2022-07-01 DIAGNOSIS — Z6831 Body mass index (BMI) 31.0-31.9, adult: Secondary | ICD-10-CM

## 2022-07-01 DIAGNOSIS — K269 Duodenal ulcer, unspecified as acute or chronic, without hemorrhage or perforation: Secondary | ICD-10-CM | POA: Diagnosis present

## 2022-07-01 DIAGNOSIS — W44F3XA Food entering into or through a natural orifice, initial encounter: Secondary | ICD-10-CM | POA: Diagnosis not present

## 2022-07-01 DIAGNOSIS — R066 Hiccough: Secondary | ICD-10-CM | POA: Diagnosis present

## 2022-07-01 DIAGNOSIS — Z01818 Encounter for other preprocedural examination: Secondary | ICD-10-CM

## 2022-07-01 DIAGNOSIS — D500 Iron deficiency anemia secondary to blood loss (chronic): Secondary | ICD-10-CM | POA: Diagnosis present

## 2022-07-01 DIAGNOSIS — Z885 Allergy status to narcotic agent status: Secondary | ICD-10-CM

## 2022-07-01 HISTORY — PX: LYSIS OF ADHESION: SHX5961

## 2022-07-01 HISTORY — PX: INGUINAL HERNIA REPAIR: SHX194

## 2022-07-01 HISTORY — PX: XI ROBOTIC ASSISTED COLOSTOMY TAKEDOWN: SHX6828

## 2022-07-01 HISTORY — DX: Acute kidney failure with acute cortical necrosis: N17.1

## 2022-07-01 HISTORY — DX: Duodenal ulcer, unspecified as acute or chronic, without hemorrhage or perforation: K26.9

## 2022-07-01 LAB — TYPE AND SCREEN
ABO/RH(D): B POS
Antibody Screen: NEGATIVE

## 2022-07-01 LAB — GLUCOSE, CAPILLARY
Glucose-Capillary: 75 mg/dL (ref 70–99)
Glucose-Capillary: 129 mg/dL — ABNORMAL HIGH (ref 70–99)

## 2022-07-01 LAB — HEMOGLOBIN A1C
Hgb A1c MFr Bld: 5.1 % (ref 4.8–5.6)
Mean Plasma Glucose: 99.67 mg/dL

## 2022-07-01 SURGERY — CLOSURE, COLOSTOMY, ROBOT-ASSISTED
Anesthesia: General

## 2022-07-01 MED ORDER — SODIUM CHLORIDE 0.9% FLUSH
3.0000 mL | Freq: Two times a day (BID) | INTRAVENOUS | Status: DC
  Administered 2022-07-01 – 2022-07-07 (×8): 3 mL via INTRAVENOUS

## 2022-07-01 MED ORDER — TRAMADOL HCL 50 MG PO TABS
50.0000 mg | ORAL_TABLET | Freq: Four times a day (QID) | ORAL | 0 refills | Status: DC | PRN
  Filled 2022-07-01: qty 20, 3d supply, fill #0

## 2022-07-01 MED ORDER — PHENYLEPHRINE HCL-NACL 20-0.9 MG/250ML-% IV SOLN
INTRAVENOUS | Status: DC | PRN
  Administered 2022-07-01: 20 ug/min via INTRAVENOUS

## 2022-07-01 MED ORDER — ORAL CARE MOUTH RINSE: 15.0000 mL | Freq: Once | OROMUCOSAL | Status: AC

## 2022-07-01 MED ORDER — METOPROLOL TARTRATE 5 MG/5ML IV SOLN
5.0000 mg | Freq: Four times a day (QID) | INTRAVENOUS | Status: AC | PRN
  Administered 2022-07-06 – 2022-07-09 (×4): 5 mg via INTRAVENOUS
  Filled 2022-07-01 (×4): qty 5

## 2022-07-01 MED ORDER — LORAZEPAM 0.5 MG PO TABS: 0.5000 mg | ORAL_TABLET | Freq: Two times a day (BID) | ORAL | Status: DC | PRN

## 2022-07-01 MED ORDER — PROCHLORPERAZINE MALEATE 10 MG PO TABS: 10.0000 mg | ORAL_TABLET | Freq: Four times a day (QID) | ORAL | Status: DC | PRN

## 2022-07-01 MED ORDER — BUPIVACAINE LIPOSOME 1.3 % IJ SUSP
INTRAMUSCULAR | Status: AC
  Filled 2022-07-01: qty 20

## 2022-07-01 MED ORDER — ALLOPURINOL 100 MG PO TABS
100.0000 mg | ORAL_TABLET | Freq: Every day | ORAL | Status: DC
  Administered 2022-07-01 – 2022-07-26 (×18): 100 mg via ORAL
  Filled 2022-07-01 (×20): qty 1

## 2022-07-01 MED ORDER — HYDROMORPHONE HCL 1 MG/ML IJ SOLN
0.2500 mg | INTRAMUSCULAR | Status: DC | PRN
  Administered 2022-07-01: 0.5 mg via INTRAVENOUS

## 2022-07-01 MED ORDER — HYDROMORPHONE HCL 1 MG/ML IJ SOLN
INTRAMUSCULAR | Status: AC
  Administered 2022-07-01: 0.5 mg via INTRAVENOUS
  Filled 2022-07-01: qty 1

## 2022-07-01 MED ORDER — PROPOFOL 10 MG/ML IV BOLUS
INTRAVENOUS | Status: AC
  Filled 2022-07-01: qty 20

## 2022-07-01 MED ORDER — ROCURONIUM BROMIDE 10 MG/ML (PF) SYRINGE
PREFILLED_SYRINGE | INTRAVENOUS | Status: AC
  Filled 2022-07-01: qty 10

## 2022-07-01 MED ORDER — MELATONIN 3 MG PO TABS: 3.0000 mg | ORAL_TABLET | Freq: Every evening | ORAL | Status: DC | PRN

## 2022-07-01 MED ORDER — ROCURONIUM BROMIDE 10 MG/ML (PF) SYRINGE
PREFILLED_SYRINGE | INTRAVENOUS | Status: DC | PRN
  Administered 2022-07-01: 60 mg via INTRAVENOUS
  Administered 2022-07-01 (×2): 20 mg via INTRAVENOUS
  Administered 2022-07-01: 40 mg via INTRAVENOUS

## 2022-07-01 MED ORDER — PHENYLEPHRINE 80 MCG/ML (10ML) SYRINGE FOR IV PUSH (FOR BLOOD PRESSURE SUPPORT)
PREFILLED_SYRINGE | INTRAVENOUS | Status: DC | PRN
  Administered 2022-07-01: 40 ug via INTRAVENOUS

## 2022-07-01 MED ORDER — ALUM & MAG HYDROXIDE-SIMETH 200-200-20 MG/5ML PO SUSP: 30.0000 mL | Freq: Four times a day (QID) | ORAL | Status: DC | PRN

## 2022-07-01 MED ORDER — ENSURE PRE-SURGERY PO LIQD: 592.0000 mL | Freq: Once | ORAL | Status: DC

## 2022-07-01 MED ORDER — EPHEDRINE SULFATE-NACL 50-0.9 MG/10ML-% IV SOSY
PREFILLED_SYRINGE | INTRAVENOUS | Status: DC | PRN
  Administered 2022-07-01 (×2): 5 mg via INTRAVENOUS

## 2022-07-01 MED ORDER — ONDANSETRON HCL 4 MG/2ML IJ SOLN
4.0000 mg | Freq: Four times a day (QID) | INTRAMUSCULAR | Status: DC | PRN
  Administered 2022-07-02 – 2022-07-24 (×23): 4 mg via INTRAVENOUS
  Filled 2022-07-01 (×24): qty 2

## 2022-07-01 MED ORDER — DIPHENHYDRAMINE HCL 12.5 MG/5ML PO ELIX
12.5000 mg | ORAL_SOLUTION | Freq: Four times a day (QID) | ORAL | Status: DC | PRN
  Filled 2022-07-01: qty 5

## 2022-07-01 MED ORDER — LIDOCAINE 2% (20 MG/ML) 5 ML SYRINGE
INTRAMUSCULAR | Status: DC | PRN
  Administered 2022-07-01: 60 mg via INTRAVENOUS

## 2022-07-01 MED ORDER — LACTATED RINGERS IV SOLN: INTRAVENOUS | Status: DC

## 2022-07-01 MED ORDER — GABAPENTIN 100 MG PO CAPS
200.0000 mg | ORAL_CAPSULE | Freq: Every day | ORAL | Status: DC
  Administered 2022-07-01 – 2022-07-02 (×2): 200 mg via ORAL
  Filled 2022-07-01 (×2): qty 2

## 2022-07-01 MED ORDER — ENOXAPARIN SODIUM 40 MG/0.4ML IJ SOSY
40.0000 mg | PREFILLED_SYRINGE | Freq: Once | INTRAMUSCULAR | Status: AC
  Administered 2022-07-01: 40 mg via SUBCUTANEOUS
  Filled 2022-07-01: qty 0.4

## 2022-07-01 MED ORDER — TRAMADOL HCL 50 MG PO TABS
50.0000 mg | ORAL_TABLET | Freq: Four times a day (QID) | ORAL | Status: DC | PRN
  Administered 2022-07-01 – 2022-07-03 (×4): 100 mg via ORAL
  Filled 2022-07-01 (×4): qty 2

## 2022-07-01 MED ORDER — METRONIDAZOLE 500 MG PO TABS: 1000.0000 mg | ORAL_TABLET | ORAL | Status: DC

## 2022-07-01 MED ORDER — SUGAMMADEX SODIUM 200 MG/2ML IV SOLN
INTRAVENOUS | Status: DC | PRN
  Administered 2022-07-01: 300 mg via INTRAVENOUS

## 2022-07-01 MED ORDER — BUPIVACAINE-EPINEPHRINE (PF) 0.25% -1:200000 IJ SOLN
INTRAMUSCULAR | Status: AC
  Filled 2022-07-01: qty 60

## 2022-07-01 MED ORDER — PANTOPRAZOLE SODIUM 40 MG PO TBEC
40.0000 mg | DELAYED_RELEASE_TABLET | Freq: Every day | ORAL | Status: DC
  Administered 2022-07-02 – 2022-07-03 (×2): 40 mg via ORAL
  Filled 2022-07-01 (×2): qty 1

## 2022-07-01 MED ORDER — 0.9 % SODIUM CHLORIDE (POUR BTL) OPTIME
TOPICAL | Status: DC | PRN
  Administered 2022-07-01: 2000 mL

## 2022-07-01 MED ORDER — CHLORHEXIDINE GLUCONATE CLOTH 2 % EX PADS: 6.0000 | MEDICATED_PAD | Freq: Once | CUTANEOUS | Status: DC

## 2022-07-01 MED ORDER — PHENYLEPHRINE 80 MCG/ML (10ML) SYRINGE FOR IV PUSH (FOR BLOOD PRESSURE SUPPORT)
PREFILLED_SYRINGE | INTRAVENOUS | Status: AC
  Filled 2022-07-01: qty 10

## 2022-07-01 MED ORDER — HYDRALAZINE HCL 20 MG/ML IJ SOLN
10.0000 mg | INTRAMUSCULAR | Status: AC | PRN
  Administered 2022-07-03 – 2022-07-08 (×4): 10 mg via INTRAVENOUS
  Filled 2022-07-01 (×5): qty 1

## 2022-07-01 MED ORDER — CHLORHEXIDINE GLUCONATE 0.12 % MT SOLN
15.0000 mL | Freq: Once | OROMUCOSAL | Status: AC
  Administered 2022-07-01: 15 mL via OROMUCOSAL

## 2022-07-01 MED ORDER — INDOCYANINE GREEN 25 MG IV SOLR
INTRAVENOUS | Status: DC | PRN
  Administered 2022-07-01: 7.5 mg via INTRAVENOUS

## 2022-07-01 MED ORDER — LIP MEDEX EX OINT
TOPICAL_OINTMENT | Freq: Two times a day (BID) | CUTANEOUS | Status: DC
  Administered 2022-07-01 – 2022-07-02 (×2): 1 via TOPICAL
  Administered 2022-07-04 – 2022-07-05 (×4): 75 via TOPICAL
  Administered 2022-07-08 – 2022-07-10 (×2): 1 via TOPICAL
  Administered 2022-07-11: 75 via TOPICAL
  Administered 2022-07-13 – 2022-07-18 (×4): 1 via TOPICAL
  Administered 2022-07-22: 75 via TOPICAL
  Filled 2022-07-01 (×7): qty 7

## 2022-07-01 MED ORDER — NEOMYCIN SULFATE 500 MG PO TABS: 1000.0000 mg | ORAL_TABLET | ORAL | Status: DC

## 2022-07-01 MED ORDER — FENTANYL CITRATE (PF) 100 MCG/2ML IJ SOLN
INTRAMUSCULAR | Status: AC
  Filled 2022-07-01: qty 2

## 2022-07-01 MED ORDER — STERILE WATER FOR INJECTION IJ SOLN
INTRAMUSCULAR | Status: DC | PRN
  Administered 2022-07-01: 4 mL via INTRAVENOUS

## 2022-07-01 MED ORDER — METHOCARBAMOL 500 MG PO TABS
1000.0000 mg | ORAL_TABLET | Freq: Four times a day (QID) | ORAL | Status: DC | PRN
  Administered 2022-07-02 – 2022-07-03 (×2): 1000 mg via ORAL
  Filled 2022-07-01 (×3): qty 2

## 2022-07-01 MED ORDER — MIDAZOLAM HCL 2 MG/2ML IJ SOLN
INTRAMUSCULAR | Status: AC
  Filled 2022-07-01: qty 2

## 2022-07-01 MED ORDER — SIMETHICONE 80 MG PO CHEW
40.0000 mg | CHEWABLE_TABLET | Freq: Four times a day (QID) | ORAL | Status: DC | PRN
  Administered 2022-07-12 – 2022-07-22 (×3): 40 mg via ORAL
  Filled 2022-07-01 (×3): qty 1

## 2022-07-01 MED ORDER — CALCIUM POLYCARBOPHIL 625 MG PO TABS
625.0000 mg | ORAL_TABLET | Freq: Two times a day (BID) | ORAL | Status: DC
  Administered 2022-07-01 – 2022-07-03 (×4): 625 mg via ORAL
  Filled 2022-07-01 (×5): qty 1

## 2022-07-01 MED ORDER — SODIUM CHLORIDE 0.9% FLUSH: 3.0000 mL | INTRAVENOUS | Status: DC | PRN

## 2022-07-01 MED ORDER — ONDANSETRON HCL 4 MG PO TABS: 4.0000 mg | ORAL_TABLET | Freq: Four times a day (QID) | ORAL | Status: DC | PRN

## 2022-07-01 MED ORDER — HYDROMORPHONE HCL 1 MG/ML IJ SOLN
0.5000 mg | INTRAMUSCULAR | Status: DC | PRN
  Administered 2022-07-03 – 2022-07-04 (×4): 1 mg via INTRAVENOUS
  Administered 2022-07-04 – 2022-07-06 (×13): 2 mg via INTRAVENOUS
  Filled 2022-07-01 (×2): qty 1
  Filled 2022-07-01 (×4): qty 2
  Filled 2022-07-01 (×2): qty 1
  Filled 2022-07-01: qty 2
  Filled 2022-07-01: qty 1
  Filled 2022-07-01 (×6): qty 2
  Filled 2022-07-01: qty 1
  Filled 2022-07-01: qty 2

## 2022-07-01 MED ORDER — BISACODYL 5 MG PO TBEC: 20.0000 mg | DELAYED_RELEASE_TABLET | Freq: Once | ORAL | Status: DC

## 2022-07-01 MED ORDER — ENSURE PRE-SURGERY PO LIQD: 296.0000 mL | Freq: Once | ORAL | Status: DC

## 2022-07-01 MED ORDER — ENSURE SURGERY PO LIQD
237.0000 mL | Freq: Two times a day (BID) | ORAL | Status: DC
  Administered 2022-07-02 – 2022-07-03 (×2): 237 mL via ORAL
  Filled 2022-07-01 (×3): qty 237

## 2022-07-01 MED ORDER — ENOXAPARIN SODIUM 40 MG/0.4ML IJ SOSY
40.0000 mg | PREFILLED_SYRINGE | INTRAMUSCULAR | Status: DC
  Administered 2022-07-02 – 2022-07-27 (×26): 40 mg via SUBCUTANEOUS
  Filled 2022-07-01 (×26): qty 0.4

## 2022-07-01 MED ORDER — SODIUM CHLORIDE 0.9 % IV SOLN
2.0000 g | Freq: Two times a day (BID) | INTRAVENOUS | Status: AC
  Administered 2022-07-02: 2 g via INTRAVENOUS
  Filled 2022-07-01: qty 2

## 2022-07-01 MED ORDER — POLYETHYLENE GLYCOL 3350 17 GM/SCOOP PO POWD: 1.0000 | Freq: Once | ORAL | Status: DC

## 2022-07-01 MED ORDER — FENTANYL CITRATE (PF) 100 MCG/2ML IJ SOLN
INTRAMUSCULAR | Status: DC | PRN
  Administered 2022-07-01: 100 ug via INTRAVENOUS
  Administered 2022-07-01 (×2): 50 ug via INTRAVENOUS

## 2022-07-01 MED ORDER — GABAPENTIN 100 MG PO CAPS
100.0000 mg | ORAL_CAPSULE | Freq: Every day | ORAL | Status: DC
  Administered 2022-07-02 – 2022-07-03 (×2): 100 mg via ORAL
  Filled 2022-07-01 (×2): qty 1

## 2022-07-01 MED ORDER — DIPHENHYDRAMINE HCL 50 MG/ML IJ SOLN
12.5000 mg | Freq: Four times a day (QID) | INTRAMUSCULAR | Status: DC | PRN
  Administered 2022-07-06 – 2022-07-22 (×3): 12.5 mg via INTRAVENOUS
  Filled 2022-07-01 (×3): qty 1

## 2022-07-01 MED ORDER — MAGIC MOUTHWASH: 15.0000 mL | Freq: Four times a day (QID) | ORAL | Status: DC | PRN

## 2022-07-01 MED ORDER — MENTHOL 3 MG MT LOZG: 1.0000 | LOZENGE | OROMUCOSAL | Status: DC | PRN

## 2022-07-01 MED ORDER — BUPIVACAINE-EPINEPHRINE (PF) 0.25% -1:200000 IJ SOLN
INTRAMUSCULAR | Status: DC | PRN
  Administered 2022-07-01: 80 mL

## 2022-07-01 MED ORDER — LACTATED RINGERS IV BOLUS: 1000.0000 mL | Freq: Three times a day (TID) | INTRAVENOUS | Status: AC | PRN

## 2022-07-01 MED ORDER — LEVETIRACETAM 500 MG PO TABS
500.0000 mg | ORAL_TABLET | Freq: Two times a day (BID) | ORAL | Status: DC
  Administered 2022-07-01 – 2022-07-03 (×4): 500 mg via ORAL
  Filled 2022-07-01 (×4): qty 1

## 2022-07-01 MED ORDER — DEXAMETHASONE SODIUM PHOSPHATE 10 MG/ML IJ SOLN
INTRAMUSCULAR | Status: AC
  Filled 2022-07-01: qty 1

## 2022-07-01 MED ORDER — ACETAMINOPHEN 500 MG PO TABS
1000.0000 mg | ORAL_TABLET | ORAL | Status: AC
  Administered 2022-07-01: 1000 mg via ORAL
  Filled 2022-07-01: qty 2

## 2022-07-01 MED ORDER — LACTATED RINGERS IV SOLN: INTRAVENOUS | Status: DC | PRN

## 2022-07-01 MED ORDER — SODIUM CHLORIDE 0.9 % IV SOLN
2.0000 g | INTRAVENOUS | Status: AC
  Administered 2022-07-01: 2 g via INTRAVENOUS
  Filled 2022-07-01: qty 2

## 2022-07-01 MED ORDER — AMISULPRIDE (ANTIEMETIC) 5 MG/2ML IV SOLN: 10.0000 mg | Freq: Once | INTRAVENOUS | Status: DC | PRN

## 2022-07-01 MED ORDER — PROMETHAZINE HCL 25 MG/ML IJ SOLN: 6.2500 mg | INTRAMUSCULAR | Status: DC | PRN

## 2022-07-01 MED ORDER — ALVIMOPAN 12 MG PO CAPS
12.0000 mg | ORAL_CAPSULE | Freq: Two times a day (BID) | ORAL | Status: AC
  Administered 2022-07-02 – 2022-07-03 (×3): 12 mg via ORAL
  Filled 2022-07-01 (×14): qty 1

## 2022-07-01 MED ORDER — ACETAMINOPHEN 500 MG PO TABS
1000.0000 mg | ORAL_TABLET | Freq: Four times a day (QID) | ORAL | Status: DC
  Administered 2022-07-01 – 2022-07-03 (×9): 1000 mg via ORAL
  Filled 2022-07-01 (×9): qty 2

## 2022-07-01 MED ORDER — SODIUM CHLORIDE 0.9 % IV SOLN: 250.0000 mL | INTRAVENOUS | Status: DC | PRN

## 2022-07-01 MED ORDER — MIDAZOLAM HCL 2 MG/2ML IJ SOLN
INTRAMUSCULAR | Status: DC | PRN
  Administered 2022-07-01 (×2): 1 mg via INTRAVENOUS

## 2022-07-01 MED ORDER — DEXAMETHASONE SODIUM PHOSPHATE 10 MG/ML IJ SOLN
INTRAMUSCULAR | Status: DC | PRN
  Administered 2022-07-01: 4 mg via INTRAVENOUS

## 2022-07-01 MED ORDER — PHENOL 1.4 % MT LIQD
2.0000 | OROMUCOSAL | Status: DC | PRN
  Administered 2022-07-04 – 2022-07-05 (×2): 2 via OROMUCOSAL
  Filled 2022-07-01: qty 177

## 2022-07-01 MED ORDER — PROCHLORPERAZINE EDISYLATE 10 MG/2ML IJ SOLN
5.0000 mg | Freq: Four times a day (QID) | INTRAMUSCULAR | Status: DC | PRN
  Administered 2022-07-04: 5 mg via INTRAVENOUS
  Administered 2022-07-05 – 2022-07-10 (×3): 10 mg via INTRAVENOUS
  Filled 2022-07-01 (×5): qty 2

## 2022-07-01 MED ORDER — GABAPENTIN 300 MG PO CAPS
300.0000 mg | ORAL_CAPSULE | ORAL | Status: AC
  Administered 2022-07-01: 300 mg via ORAL
  Filled 2022-07-01: qty 1

## 2022-07-01 MED ORDER — BUPIVACAINE LIPOSOME 1.3 % IJ SUSP: 20.0000 mL | Freq: Once | INTRAMUSCULAR | Status: DC

## 2022-07-01 MED ORDER — METHOCARBAMOL 1000 MG/10ML IJ SOLN
1000.0000 mg | Freq: Four times a day (QID) | INTRAVENOUS | Status: DC | PRN
  Administered 2022-07-05 – 2022-07-06 (×3): 1000 mg via INTRAVENOUS
  Filled 2022-07-01 (×3): qty 1000

## 2022-07-01 MED ORDER — PROPOFOL 10 MG/ML IV BOLUS
INTRAVENOUS | Status: DC | PRN
  Administered 2022-07-01: 140 mg via INTRAVENOUS

## 2022-07-01 MED ORDER — ONDANSETRON HCL 4 MG/2ML IJ SOLN
INTRAMUSCULAR | Status: AC
  Filled 2022-07-01: qty 2

## 2022-07-01 MED ORDER — ALVIMOPAN 12 MG PO CAPS
12.0000 mg | ORAL_CAPSULE | ORAL | Status: AC
  Administered 2022-07-01: 12 mg via ORAL
  Filled 2022-07-01: qty 1

## 2022-07-01 MED ORDER — ONDANSETRON HCL 4 MG/2ML IJ SOLN
INTRAMUSCULAR | Status: DC | PRN
  Administered 2022-07-01: 4 mg via INTRAVENOUS

## 2022-07-01 SURGICAL SUPPLY — 120 items
APL PRP STRL LF DISP 70% ISPRP (MISCELLANEOUS)
APPLIER CLIP 5 13 M/L LIGAMAX5 (MISCELLANEOUS)
APPLIER CLIP ROT 10 11.4 M/L (STAPLE)
APR CLP MED LRG 11.4X10 (STAPLE)
APR CLP MED LRG 5 ANG JAW (MISCELLANEOUS)
BAG COUNTER SPONGE SURGICOUNT (BAG) ×2 IMPLANT
BAG SPNG CNTER NS LX DISP (BAG) ×1
BLADE EXTENDED COATED 6.5IN (ELECTRODE) IMPLANT
CANNULA REDUC XI 12-8 STAPL (CANNULA)
CANNULA REDUCER 12-8 DVNC XI (CANNULA) IMPLANT
CELLS DAT CNTRL 66122 CELL SVR (MISCELLANEOUS) IMPLANT
CHLORAPREP W/TINT 26 (MISCELLANEOUS) IMPLANT
CLIP APPLIE 5 13 M/L LIGAMAX5 (MISCELLANEOUS) IMPLANT
CLIP APPLIE ROT 10 11.4 M/L (STAPLE) IMPLANT
COVER SURGICAL LIGHT HANDLE (MISCELLANEOUS) ×4 IMPLANT
COVER TIP SHEARS 8 DVNC (MISCELLANEOUS) ×2 IMPLANT
COVER TIP SHEARS 8MM DA VINCI (MISCELLANEOUS) ×1
DEVICE TROCAR PUNCTURE CLOSURE (ENDOMECHANICALS) IMPLANT
DRAIN CHANNEL 19F RND (DRAIN) IMPLANT
DRAPE ARM DVNC X/XI (DISPOSABLE) ×8 IMPLANT
DRAPE COLUMN DVNC XI (DISPOSABLE) ×2 IMPLANT
DRAPE DA VINCI XI ARM (DISPOSABLE) ×4
DRAPE DA VINCI XI COLUMN (DISPOSABLE) ×1
DRAPE SURG IRRIG POUCH 19X23 (DRAPES) ×2 IMPLANT
DRSG OPSITE POSTOP 4X10 (GAUZE/BANDAGES/DRESSINGS) IMPLANT
DRSG OPSITE POSTOP 4X6 (GAUZE/BANDAGES/DRESSINGS) IMPLANT
DRSG OPSITE POSTOP 4X8 (GAUZE/BANDAGES/DRESSINGS) IMPLANT
DRSG TEGADERM 2-3/8X2-3/4 SM (GAUZE/BANDAGES/DRESSINGS) ×10 IMPLANT
DRSG TEGADERM 4X4.75 (GAUZE/BANDAGES/DRESSINGS) IMPLANT
ELECT PENCIL ROCKER SW 15FT (MISCELLANEOUS) ×2 IMPLANT
ELECT REM PT RETURN 15FT ADLT (MISCELLANEOUS) ×2 IMPLANT
ENDOLOOP SUT PDS II  0 18 (SUTURE)
ENDOLOOP SUT PDS II 0 18 (SUTURE) IMPLANT
EVACUATOR SILICONE 100CC (DRAIN) IMPLANT
GAUZE SPONGE 2X2 STRL 8-PLY (GAUZE/BANDAGES/DRESSINGS) ×2 IMPLANT
GLOVE ECLIPSE 8.0 STRL XLNG CF (GLOVE) ×6 IMPLANT
GLOVE INDICATOR 8.0 STRL GRN (GLOVE) ×6 IMPLANT
GOWN SRG XL LVL 4 BRTHBL STRL (GOWNS) ×2 IMPLANT
GOWN STRL NON-REIN XL LVL4 (GOWNS) ×1
GOWN STRL REUS W/ TWL XL LVL3 (GOWN DISPOSABLE) ×8 IMPLANT
GOWN STRL REUS W/TWL XL LVL3 (GOWN DISPOSABLE) ×4
GRASPER SUT TROCAR 14GX15 (MISCELLANEOUS) IMPLANT
HOLDER FOLEY CATH W/STRAP (MISCELLANEOUS) ×2 IMPLANT
IRRIG SUCT STRYKERFLOW 2 WTIP (MISCELLANEOUS) ×1
IRRIGATION SUCT STRKRFLW 2 WTP (MISCELLANEOUS) ×2 IMPLANT
KIT PROCEDURE DA VINCI SI (MISCELLANEOUS) ×1
KIT PROCEDURE DVNC SI (MISCELLANEOUS) IMPLANT
KIT SIGMOIDOSCOPE (SET/KITS/TRAYS/PACK) IMPLANT
KIT TURNOVER KIT A (KITS) IMPLANT
MARKER SKIN DUAL TIP RULER LAB (MISCELLANEOUS) IMPLANT
NDL INSUFFLATION 14GA 120MM (NEEDLE) ×2 IMPLANT
NEEDLE INSUFFLATION 14GA 120MM (NEEDLE) ×1 IMPLANT
PACK CARDIOVASCULAR III (CUSTOM PROCEDURE TRAY) ×2 IMPLANT
PACK COLON (CUSTOM PROCEDURE TRAY) ×2 IMPLANT
PAD POSITIONING PINK XL (MISCELLANEOUS) ×2 IMPLANT
PROTECTOR NERVE ULNAR (MISCELLANEOUS) ×4 IMPLANT
RELOAD STAPLE 45 3.5 BLU DVNC (STAPLE) IMPLANT
RELOAD STAPLE 45 4.3 GRN DVNC (STAPLE) IMPLANT
RELOAD STAPLE 60 3.5 BLU DVNC (STAPLE) IMPLANT
RELOAD STAPLE 60 4.3 GRN DVNC (STAPLE) IMPLANT
RELOAD STAPLER 3.5X45 BLU DVNC (STAPLE) IMPLANT
RELOAD STAPLER 3.5X60 BLU DVNC (STAPLE) ×2 IMPLANT
RELOAD STAPLER 4.3X45 GRN DVNC (STAPLE) IMPLANT
RELOAD STAPLER 4.3X60 GRN DVNC (STAPLE) IMPLANT
RETRACTOR WND ALEXIS 18 MED (MISCELLANEOUS) IMPLANT
RTRCTR WOUND ALEXIS 18CM MED (MISCELLANEOUS)
SCISSORS LAP 5X35 DISP (ENDOMECHANICALS) ×2 IMPLANT
SEAL CANN UNIV 5-8 DVNC XI (MISCELLANEOUS) ×6 IMPLANT
SEAL XI 5MM-8MM UNIVERSAL (MISCELLANEOUS) ×3
SEALER VESSEL DA VINCI XI (MISCELLANEOUS) ×1
SEALER VESSEL EXT DVNC XI (MISCELLANEOUS) ×2 IMPLANT
SOL ELECTROSURG ANTI STICK (MISCELLANEOUS) ×1
SOLUTION ELECTROSURG ANTI STCK (MISCELLANEOUS) ×2 IMPLANT
SPIKE FLUID TRANSFER (MISCELLANEOUS) ×2 IMPLANT
STAPLER 45 DA VINCI SURE FORM (STAPLE)
STAPLER 45 SUREFORM DVNC (STAPLE) IMPLANT
STAPLER 60 DA VINCI SURE FORM (STAPLE) ×1
STAPLER 60 SUREFORM DVNC (STAPLE) IMPLANT
STAPLER CANNULA SEAL DVNC XI (STAPLE) ×2 IMPLANT
STAPLER CANNULA SEAL XI (STAPLE) ×1
STAPLER ECHELON POWER CIR 29 (STAPLE) IMPLANT
STAPLER ECHELON POWER CIR 31 (STAPLE) IMPLANT
STAPLER RELOAD 3.5X45 BLU DVNC (STAPLE)
STAPLER RELOAD 3.5X45 BLUE (STAPLE)
STAPLER RELOAD 3.5X60 BLU DVNC (STAPLE) ×2
STAPLER RELOAD 3.5X60 BLUE (STAPLE) ×2
STAPLER RELOAD 4.3X45 GREEN (STAPLE)
STAPLER RELOAD 4.3X45 GRN DVNC (STAPLE)
STAPLER RELOAD 4.3X60 GREEN (STAPLE)
STAPLER RELOAD 4.3X60 GRN DVNC (STAPLE)
STOPCOCK 4 WAY LG BORE MALE ST (IV SETS) ×4 IMPLANT
SURGILUBE 2OZ TUBE FLIPTOP (MISCELLANEOUS) IMPLANT
SUT MNCRL AB 4-0 PS2 18 (SUTURE) ×2 IMPLANT
SUT PDS AB 1 CT1 27 (SUTURE) ×4 IMPLANT
SUT PROLENE 0 CT 2 (SUTURE) IMPLANT
SUT PROLENE 2 0 KS (SUTURE) IMPLANT
SUT PROLENE 2 0 SH DA (SUTURE) IMPLANT
SUT SILK 2 0 (SUTURE)
SUT SILK 2 0 SH CR/8 (SUTURE) IMPLANT
SUT SILK 2-0 18XBRD TIE 12 (SUTURE) IMPLANT
SUT SILK 3 0 (SUTURE)
SUT SILK 3 0 SH CR/8 (SUTURE) ×2 IMPLANT
SUT SILK 3-0 18XBRD TIE 12 (SUTURE) IMPLANT
SUT V-LOC BARB 180 2/0GR6 GS22 (SUTURE) ×2
SUT VIC AB 3-0 SH 18 (SUTURE) IMPLANT
SUT VIC AB 3-0 SH 27 (SUTURE) ×1
SUT VIC AB 3-0 SH 27XBRD (SUTURE) IMPLANT
SUT VICRYL 0 UR6 27IN ABS (SUTURE) ×2 IMPLANT
SUTURE V-LC BRB 180 2/0GR6GS22 (SUTURE) IMPLANT
SYR 20ML ECCENTRIC (SYRINGE) ×2 IMPLANT
SYS LAPSCP GELPORT 120MM (MISCELLANEOUS)
SYS WOUND ALEXIS 18CM MED (MISCELLANEOUS) ×1
SYSTEM LAPSCP GELPORT 120MM (MISCELLANEOUS) IMPLANT
SYSTEM WOUND ALEXIS 18CM MED (MISCELLANEOUS) ×2 IMPLANT
TAPE UMBILICAL 1/8 X36 TWILL (MISCELLANEOUS) IMPLANT
TOWEL OR NON WOVEN STRL DISP B (DISPOSABLE) ×2 IMPLANT
TRAY FOLEY MTR SLVR 16FR STAT (SET/KITS/TRAYS/PACK) ×2 IMPLANT
TROCAR ADV FIXATION 5X100MM (TROCAR) ×2 IMPLANT
TUBING CONNECTING 10 (TUBING) ×4 IMPLANT
TUBING INSUFFLATION 10FT LAP (TUBING) ×2 IMPLANT

## 2022-07-01 NOTE — H&P (Signed)
07/01/2022   REFERRING PHYSICIAN: Self  Patient Care Team: [name] (Inactive) as PCP - General [name], MD as Consulting Provider (General Surgery) Delmo Matty, Adrian Saran, MD as Consulting Provider (General Surgery) Eustace Quail., MD (Gastroenterology) Benay Spice Newman Nickels, MD (Hematology and Oncology)  PROVIDER: Hollace Kinnier, MD  [facility] MRN: [id number] DOB: [birth year]  SUBJECTIVE   Chief Complaint: ostomy reversal   Thomas [name] is a 67 y.o. male  who is seen today as an office consultation  at the request of DrBobbye Morton  for evaluation of considering ostomy takedown.  History of Present Illness:  67 year old gentleman. Patient presented abdominal pain found to have transverse colon mass. Underwent colonoscopy by  [facility] gastrology April 2022. 1 polyp removed distal into the colon mass. Biopsy consistent with adenocarcinoma. Became more symptomatic and was admitted. Underwent exploratory laparotomy, transverse colectomy and end colostomy 09/10/2020 by Dr. Jamas Lav with our group. Struggled with renal failure requiring tracheostomy. Prolonged hospital stay. Pathology came back consistent with adenocarcinoma pT3pN1. Completed post adjuvant cytopenias/oxaliplatin therapy in summer 2022. Followed by  Dr. Benay Spice. No evidence of recurrent disease. Recovered. No longer dialysis dependent. Tracheostomy out. Had colonoscopy last week through the colostomy noting a short segment of ascending colon and no other disease there.  Patient here today with his wife. He can walk 45 minutes without difficulty. No new heart or lung issues. Does not smoke. No alcohol intake. No Crohn's or colitis. Does have a history of seizures after his meningioma was resected. No acute ones. Patient also history of frontal meningioma requiring resection 2018. History of duodenal ulcer and H. pylori negative and esophagitis. Had DVT diagnosed summer 2022. Anticoagulant 6 months and no longer on  that in early 2023. Went into renal failure dialysis dependent and has recovered. No longer dialysis dependent.  Medical History:  Transverse colon cancer, stage IIIb (T3N1a), status post a partial transverse colectomy and end colostomy 09/10/2020 CT abdomen/pelvis 09/04/2020- possible transverse colon mass with small regional lymph nodes Colonoscopy 09/05/2020- obstructing transverse colon mass-biopsy invasive adenocarcinoma, mass could not be passed CT chest 09/09/2020-no evidence of metastatic disease, 3 x 2 mm subpleural nodule in the right upper lobe likely benign subpleural lymph node Partial transverse colectomy 09/10/2020, transverse colon tumor, no lymphovascular perineural invasion, 1/19 lymph nodes positive, negative margins, MSI stable, no loss of mismatch repair protein expression Cycle 1 CAPOX 10/08/2020, oxaliplatin dose reduced to 100 mg per metered squared secondary to renal insufficiency Cycle 2 CAPOX 10/29/2020 Cycle 3 CAPOX 11/19/2020, capecitabine dose reduced secondary to hand/foot syndrome, oxaliplatin dose reduced secondary to renal failure and poor performance status CT chest/abdomen/pelvis without contrast 12/01/2020-no acute abnormality in the chest, abdomen, or pelvis. No evidence of obstruction. CTs 03/05/2022-no evidence of recurrent disease Resection of a frontal meningioma 09/01/2016 Diabetes Rectal polyp- tubular adenoma on colonoscopy 09/05/2020 Hypertension Diabetic neuropathy Epilepsy Family history of breast and prostate cancer Admission with acute renal failure 10/01/2020-secondary to lack of oral intake and lisinopril, improved with intravenous hydration and holding lisinopril Admission 12/01/2020-sepsis, diarrhea HHS, A. fib with RVR, abdominal pain 11. Respiratory failure requiring intubation 12/06/2020 Tracheostomy 12/18/2020 12. Progressive renal failure-CRRT started 12/06/2020, discontinued after 12/16/2020, hemodialysis initiated 13. Thrombocytopenia secondary to  chemotherapy and sepsis, heparin-induced platelet antibody and SRA negative-resolved 14. Right lower extremity DVT diagnosed 12/12/2020-on apixaban  15. Anemia secondary to chronic disease, renal failure, and phlebotomy 16. Admission 09/29/2021 with rotavirus  Past Medical History:  Diagnosis Date  History of cancer  Seizures (CMS-HCC)  Patient Active Problem List  Diagnosis  Malignant neoplasm of transverse colon (CMS-HCC)  Colostomy in place (CMS-HCC)  Incisional hernia, without obstruction or gangrene  History of meningioma of the brain   Past Surgical History:  Procedure Laterality Date  CRANIOTOMY TUMOR LEFT PTERIONAL 09/01/2016  Dr. Christella Noa  TRANSVERSE COLECTOMY 09/10/2020  Dr. Bobbye Morton  INSERTION PORT-A-CATH 10/01/2020  Dr. Bobbye Morton  TRACHEOSTOMY 01/05/2021  Dr. Bobbye Morton    Allergies  Allergen Reactions  Morphine Other (See Comments)  Throat swelling (can tolerate oxycodone/hydrocodone/hydromorphone)   Current Outpatient Medications on File Prior to Visit  Medication Sig Dispense Refill  acetaminophen (TYLENOL) 325 MG tablet Take by mouth  allopurinoL (ZYLOPRIM) 100 MG tablet TAKE 1 TABLET BY MOUTH EVERY DAY FOR GOUT  amLODIPine (NORVASC) 10 MG tablet Take 10 mg by mouth once daily  atorvastatin (LIPITOR) 20 MG tablet Take by mouth  gabapentin (NEURONTIN) 100 MG capsule TAKE 1 CAPSULE BY MOUTH IN THE MORNING AND 2 IN THE EVENING  levETIRAcetam (KEPPRA) 1000 MG tablet Take 1,000 mg by mouth 2 (two) times daily  loperamide (IMODIUM) 2 mg capsule TAKE 1 CAPSULE BY MOUTH AS NEEDED FOR LOOSE STOOLS OR FREQUET DIARRHEA. DO NOT EXCEED 8 CAPSULES IN 24 HOURS  LORazepam (ATIVAN) 0.5 MG tablet Take 0.5 mg by mouth 2 (two) times daily as needed  omeprazole (PRILOSEC) 20 MG DR capsule Take 20 mg by mouth 2 (two) times daily   No current facility-administered medications on file prior to visit.   History reviewed. No pertinent family history.   Social History   Tobacco Use   Smoking Status Former  Types: Cigarettes  Quit date: 1991  Years since quitting: 33.0  Smokeless Tobacco Never    Social History   Socioeconomic History  Marital status: Married  Tobacco Use  Smoking status: Former  Types: Cigarettes  Quit date: 1991  Years since quitting: 33.0  Smokeless tobacco: Never  Substance and Sexual Activity  Alcohol use: Never  Drug use: Never   ############################################################  Review of Systems: A complete review of systems (ROS) was obtained from the patient.  We have reviewed this information and discussed as appropriate with the patient.  See HPI as well for other pertinent ROS.  Constitutional: No fevers, chills, sweats. Weight stable Eyes: No vision changes, No discharge HENT: No sore throats, nasal drainage Lymph: No neck swelling, No bruising easily Pulmonary: No cough, productive sputum CV: No orthopnea, PND . No exertional chest/neck/shoulder/arm pain. Patient can walk 2-3 miles .   GI: No personal nor family history of inflammatory bowel disease, irritable bowel syndrome, allergy such as Celiac Sprue, dietary/dairy problems, colitis, ulcers nor gastritis. No recent sick contacts/gastroenteritis. No travel outside the country. No changes in diet.  Renal: No UTIs, No hematuria Genital: No drainage, bleeding, masses Musculoskeletal: No severe joint pain. Good ROM major joints Skin: No sores or lesions Heme/Lymph: No easy bleeding. No swollen lymph nodes Neuro: No active seizures. No facial droop Psych: No hallucinations. No agitation  OBJECTIVE   There were no vitals filed for this visit.  There is no height or weight on file to calculate BMI.  PHYSICAL EXAM:  Constitutional: Not cachectic. Hygeine adequate. Vitals signs as above.  Eyes: No glasses. Vision adequate,Pupils reactive, normal extraocular movements. Sclera nonicteric Neuro: CN II-XII intact. No major focal sensory defects. No major  motor deficits. Lymph: No head/neck/groin lymphadenopathy Psych: No severe agitation. No severe anxiety. Judgment & insight Adequate, Oriented x4, HENT: Normocephalic, Mucus membranes moist. No thrush. Hearing: adequate  Neck: Supple, No tracheal deviation. No obvious thyromegaly Chest: No pain to chest wall compression. Good respiratory excursion. No audible wheezing CV: Pulses intact. regular. No major extremity edema Ext: No obvious deformity or contracture. Edema: Not present. No cyanosis Skin: No major subcutaneous nodules. Warm and dry Musculoskeletal: Severe joint rigidity not present. No obvious clubbing. No digital petechiae. Mobility: no assist device moving easily without restrictions  Abdomen: Obese Soft. Nondistended. Nontender. Pain right upper quadrant pink with thin stool in bag  Hernia: Present at: Midline incision, size 5x3cm.  Diastasis recti: Mild supraumbilical midline. No hepatomegaly. No splenomegaly.  Genital/Pelvic: Inguinal hernia: Not present. Inguinal lymph nodes: without lymphadenopathy nor hidradenitis.   Rectal: (Deferred)    ###################################################################  Labs, Imaging and Diagnostic Testing:  Located in 'Care Everywhere' section of Epic EMR chart  PRIOR [facility] CLINIC NOTES:  Located in [place]' section of Epic EMR chart  SURGERY NOTES:  Located in [place]' section of Epic EMR chart  PATHOLOGY:  Located in [place]' section of Epic EMR chart  Assessment and Plan:  DIAGNOSES:  Diagnoses and all orders for this visit:  Malignant neoplasm of transverse colon (CMS-HCC)  Colostomy in place (CMS-HCC)  Incisional hernia, without obstruction or gangrene  History of meningioma of the brain    ASSESSMENT/PLAN  Pleasant gentleman with need for urgent Hartmann resection for obstructing proximal transverse colon cancer complicated with renal failure and pulmonary failure eventually  recovered. Complete post adjuvant chemotherapy. No evidence of any recurrence a year and a half out. Small incisional hernia.  Reasonable to consider proximal coloostomy takedown. Robotic minimally invasive approach. Set up like a right colectomy with supine and robot docked from the right. Would plan intracorporeal ascending to mid transverse colon anastomosis.   The anatomy & physiology of the digestive tract was discussed. The pathophysiology was discussed. Possibility of remaining with an ostomy permanently was discussed. I offered ostomy takedown. Robotic/MIS/Laparoscopic & open techniques were discussed.   Risks such as bleeding, infection, abscess, leak, reoperation, possible re-ostomy, injury to other organs, need for repair of tissues / organs, need for further treatment, hernia, heart attack, death, and other risks were discussed. I noted a good likelihood this will help address the problem. Goals of post-operative recovery were discussed as well. We will work to minimize complications. Questions were answered. The patient expresses understanding & wishes to proceed with surgery.  He does have some incisional hernias. Most likely I would try and primarily repair everything. High risk of recurrence with suture only but less risk of infection if I use mesh. May need hernia repair in the future with mesh if primary suturing does not work, but we will see.  Adin Hector, MD, FACS, MASCRS Esophageal, Gastrointestinal & Colorectal Surgery Robotic and Minimally Invasive Surgery  [facility] [id number] [place], [place] [id number] [phone] Fax [phone] Main  CONTACT INFORMATION:  Weekday (9AM-5PM): Call [facility] main office at [phone]  Weeknight (5PM-9AM) or Weekend/Holiday: Check [email or web address] (password " [email or web address]") for General Surgery [facility] coverage  (Please, do not use SecureChat as it is not reliable  communication to reach operating surgeons for immediate patient care given surgeries/outpatient duties/clinic/cross-coverage/off post-call which would lead to a delay in care.  Epic staff messaging available for outptient concerns, but may not be answered for 48 hours or more).    07/01/2022

## 2022-07-01 NOTE — Anesthesia Procedure Notes (Signed)
Procedure Name: Intubation Date/Time: 07/01/2022 11:56 AM  Performed by: Eben Burow, CRNAPre-anesthesia Checklist: Patient identified, Emergency Drugs available, Suction available and Patient being monitored Patient Re-evaluated:Patient Re-evaluated prior to induction Oxygen Delivery Method: Circle system utilized Preoxygenation: Pre-oxygenation with 100% oxygen Induction Type: IV induction Ventilation: Mask ventilation without difficulty Laryngoscope Size: Glidescope and 3 Grade View: Grade I Tube type: Oral Tube size: 7.5 mm Number of attempts: 1 Airway Equipment and Method: Stylet and Oral airway Placement Confirmation: ETT inserted through vocal cords under direct vision, positive ETCO2 and breath sounds checked- equal and bilateral Secured at: 24 cm Tube secured with: Tape Dental Injury: Teeth and Oropharynx as per pre-operative assessment

## 2022-07-01 NOTE — Anesthesia Postprocedure Evaluation (Signed)
Anesthesia Post Note  Patient: Thomas [name]  Procedure(s) Performed: ROBOTIC OSTOMY TAKEDOWN WITH INTRACORPOREAL ANASTOMOSIS WITH FIREFLY LYSIS OF ADHESION HERNIA REPAIR INGUINAL INCARCERATED     Patient location during evaluation: PACU Anesthesia Type: General Level of consciousness: sedated and patient cooperative Pain management: pain level controlled Vital Signs Assessment: post-procedure vital signs reviewed and stable Respiratory status: spontaneous breathing Cardiovascular status: stable Anesthetic complications: no   No notable events documented.  Last Vitals:  Vitals:   07/01/22 1700 07/01/22 1724  BP: (!) 149/74 (!) 160/80  Pulse: 91 93  Resp: 11 16  Temp:  36.6 C  SpO2: 100% 100%    Last Pain:  Vitals:   07/01/22 1800  TempSrc:   PainSc: [name]

## 2022-07-01 NOTE — Transfer of Care (Signed)
Immediate Anesthesia Transfer of Care Note  Patient: Thomas [name]  Procedure(s) Performed: ROBOTIC OSTOMY TAKEDOWN WITH INTRACORPOREAL ANASTOMOSIS WITH FIREFLY LYSIS OF ADHESION HERNIA REPAIR INGUINAL INCARCERATED  Patient Location: PACU  Anesthesia Type:General  Level of Consciousness: awake and patient cooperative  Airway & Oxygen Therapy: Patient Spontanous Breathing and Patient connected to face mask oxygen  Post-op Assessment: Report given to RN and Post -op Vital signs reviewed and stable  Post vital signs: Reviewed and stable  Last Vitals:  Vitals Value Taken Time  BP 136/67 07/01/22 1545  Temp 36.5 C 07/01/22 1537  Pulse 81 07/01/22 1549  Resp 15 07/01/22 1549  SpO2 100 % 07/01/22 1549  Vitals shown include unvalidated device data.  Last Pain:  Vitals:   07/01/22 1125  TempSrc: Oral  PainSc:       Patients Stated Pain Goal: 3 ([id number])  Complications: No notable events documented.

## 2022-07-01 NOTE — Discharge Instructions (Addendum)
SURGERY: POST OP INSTRUCTIONS (Surgery for small bowel obstruction, colon resection, etc)   ######################################################################  EAT Gradually transition to a high fiber diet with a fiber supplement over the next few days after discharge  WALK Walk an hour a day.  Control your pain to do that.    CONTROL PAIN Control pain so that you can walk, sleep, tolerate sneezing/coughing, go up/down stairs.  HAVE A BOWEL MOVEMENT DAILY Keep your bowels regular to avoid problems.  OK to try a laxative to override constipation.  OK to use an antidairrheal to slow down diarrhea.  Call if not better after 2 tries  CALL IF YOU HAVE PROBLEMS/CONCERNS Call if you are still struggling despite following these instructions. Call if you have concerns not answered by these instructions  ######################################################################   DIET Follow a light diet the first few days at home.  Start with a bland diet such as soups, liquids, starchy foods, low fat foods, etc.  If you feel full, bloated, or constipated, stay on a ful liquid or pureed/blenderized diet for a few days until you feel better and no longer constipated. Be sure to drink plenty of fluids every day to avoid getting dehydrated (feeling dizzy, not urinating, etc.). Gradually add a fiber supplement to your diet over the next week.  Gradually get back to a regular solid diet.  Avoid fast food or heavy meals the first week as you are more likely to get nauseated. It is expected for your digestive tract to need a few months to get back to normal.  It is common for your bowel movements and stools to be irregular.  You will have occasional bloating and cramping that should eventually fade away.  Until you are eating solid food normally, off all pain medications, and back to regular activities; your bowels will not be normal. Focus on eating a low-fat, high fiber diet the rest of your life  (See Getting to Good Bowel Health, below).  CARE of your INCISION or WOUND  It is good for closed incisions and even open wounds to be washed every day.  Shower every day.  Short baths are fine.  Wash the incisions and wounds clean with soap & water.    You may leave closed incisions open to air if it is dry.   You may cover the incision with clean gauze & replace it after your daily shower for comfort.  TEGADERM & WICKS:  You have clear gauze band-aid dressings over your closed incision(s).  Remove the dressings & shoelace ribbon wicks in your largest incision 3 days after surgery.    If you have an open wound with a wound vac, see wound vac care instructions.    ACTIVITIES as tolerated Start light daily activities --- self-care, walking, climbing stairs-- beginning the day after surgery.  Gradually increase activities as tolerated.  Control your pain to be active.  Stop when you are tired.  Ideally, walk several times a day, eventually an hour a day.   Most people are back to most day-to-day activities in a few weeks.  It takes 4-8 weeks to get back to unrestricted, intense activity. If you can walk 30 minutes without difficulty, it is safe to try more intense activity such as jogging, treadmill, bicycling, low-impact aerobics, swimming, etc. Save the most intensive and strenuous activity for last (Usually 4-8 weeks after surgery) such as sit-ups, heavy lifting, contact sports, etc.  Refrain from any intense heavy lifting or straining until you are off narcotics   for pain control.  You will have off days, but things should improve week-by-week. DO NOT PUSH THROUGH PAIN.  Let pain be your guide: If it hurts to do something, don't do it.  Pain is your body warning you to avoid that activity for another week until the pain goes down. You may drive when you are no longer taking narcotic prescription pain medication, you can comfortably wear a seatbelt, and you can safely make sudden turns/stops to  protect yourself without hesitating due to pain. You may have sexual intercourse when it is comfortable. If it hurts to do something, stop.  MEDICATIONS Take your usually prescribed home medications unless otherwise directed.   Blood thinners:  Usually you can restart any strong blood thinners after the second postoperative day.  It is OK to take aspirin right away.     If you are on strong blood thinners (warfarin/Coumadin, Plavix, Xerelto, Eliquis, Pradaxa, etc), discuss with your surgeon, medicine PCP, and/or cardiologist for instructions on when to restart the blood thinner & if blood monitoring is needed (PT/INR blood check, etc).     PAIN CONTROL Pain after surgery or related to activity is often due to strain/injury to muscle, tendon, nerves and/or incisions.  This pain is usually short-term and will improve in a few months.  To help speed the process of healing and to get back to regular activity more quickly, DO THE FOLLOWING THINGS TOGETHER: Increase activity gradually.  DO NOT PUSH THROUGH PAIN Use Ice and/or Heat Try Gentle Massage and/or Stretching Take over the counter pain medication Take Narcotic prescription pain medication for more severe pain  Good pain control = faster recovery.  It is better to take more medicine to be more active than to stay in bed all day to avoid medications.  Increase activity gradually Avoid heavy lifting at first, then increase to lifting as tolerated over the next 6 weeks. Do not "push through" the pain.  Listen to your body and avoid positions and maneuvers than reproduce the pain.  Wait a few days before trying something more intense Walking an hour a day is encouraged to help your body recover faster and more safely.  Start slowly and stop when getting sore.  If you can walk 30 minutes without stopping or pain, you can try more intense activity (running, jogging, aerobics, cycling, swimming, treadmill, sex, sports, weightlifting,  etc.) Remember: If it hurts to do it, then don't do it! Use Ice and/or Heat You will have swelling and bruising around the incisions.  This will take several weeks to resolve. Ice packs or heating pads (6-8 times a day, 30-60 minutes at a time) will help sooth soreness & bruising. Some people prefer to use ice alone, heat alone, or alternate between ice & heat.  Experiment and see what works best for you.  Consider trying ice for the first few days to help decrease swelling and bruising; then, switch to heat to help relax sore spots and speed recovery. Shower every day.  Short baths are fine.  It feels good!  Keep the incisions and wounds clean with soap & water.   Try Gentle Massage and/or Stretching Massage at the area of pain many times a day Stop if you feel pain - do not overdo it Take over the counter pain medication This helps the muscle and nerve tissues become less irritable and calm down faster Choose ONE of the following over-the-counter anti-inflammatory medications: Acetaminophen 500mg tabs (Tylenol) 1-2 pills with every meal and   just before bedtime (avoid if you have liver problems or if you have acetaminophen in you narcotic prescription) Naproxen 230m tabs (ex. Aleve, Naprosyn) 1-2 pills twice a day (avoid if you have kidney, stomach, IBD, or bleeding problems) Ibuprofen 2060mtabs (ex. Advil, Motrin) 3-4 pills with every meal and just before bedtime (avoid if you have kidney, stomach, IBD, or bleeding problems) Take with food/snack several times a day as directed for at least 2 weeks to help keep pain / soreness down & more manageable. Take Narcotic prescription pain medication for more severe pain A prescription for strong pain control is often given to you upon discharge (for example: oxycodone/Percocet, hydrocodone/Norco/Vicodin, or tramadol/Ultram) Take your pain medication as prescribed. Be mindful that most narcotic prescriptions contain Tylenol (acetaminophen) as well -  avoid taking too much Tylenol. If you are having problems/concerns with the prescription medicine (does not control pain, nausea, vomiting, rash, itching, etc.), please call [phone] see if we need to switch you to a different pain medicine that will work better for you and/or control your side effects better. If you need a refill on your pain medication, you must call the office before 4 pm and on weekdays only.  By federal law, prescriptions for narcotics cannot be called into a pharmacy.  They must be filled out on paper & picked up from our office by the patient or authorized caretaker.  Prescriptions cannot be filled after 4 pm nor on weekends.    WHEN TO CALL [phone] uncontrolled or worsening pain  Fever over 101 F (38.5 C) Concerns with the incision: Worsening pain, redness, rash/hives, swelling, bleeding, or drainage Reactions / problems with new medications (itching, rash, hives, nausea, etc.) Nausea and/or vomiting Difficulty urinating Difficulty breathing Worsening fatigue, dizziness, lightheadedness, blurred vision Other concerns If you are not getting better after two weeks or are noticing you are getting worse, contact our office [phone] for further advice.  We may need to adjust your medications, re-evaluate you in the office, send you to the emergency room, or see what other things we can do to help. The clinic staff is available to answer your questions during regular business hours (8:30am-5pm).  Please don't hesitate to call and ask to speak to one of our nurses for clinical concerns.    [facility] is always on call at the hospitals 24 hours/day If you have a medical emergency, go to the nearest emergency room or call 911.  FOLLOW UP in our office One the day of your discharge from the hospital (or the next business weekday), please call [facility] to set up or confirm an appointment to see your  surgeon in the office for a follow-up appointment.  Usually it is 2-3 weeks after your surgery.   If you have skin staples at your incision(s), let the office know so we can set up a time in the office for the nurse to remove them (usually around 10 days after surgery). Make sure that you call for appointments the day of discharge (or the next business weekday) from the hospital to ensure a convenient appointment time. IF YOU HAVE DISABILITY OR FAMILY LEAVE FORMS, BRING THEM TO THE OFFICE FOR PROCESSING.  DO NOT GIVE THEM TO YOUR DOCTOR.  [facility] [place] [address] [address]  [id number] [phone] Main [phone] [place] ([phone] Fax [email or web address]  GETTING TO GOOD BOWEL HEALTH. It is expected for your digestive tract to need a few months to get back to normal.  It is common for your bowel movements and stools to be irregular.  You will have occasional bloating and cramping that should eventually fade away.  Until you are eating solid food normally, off all pain medications, and back to regular activities; your bowels will not be normal.   Avoiding constipation The goal: ONE SOFT BOWEL MOVEMENT A DAY!    Drink plenty of fluids.  Choose water first. TAKE A FIBER SUPPLEMENT EVERY DAY THE REST OF YOUR LIFE During your first week back home, gradually add back a fiber supplement every day Experiment which form you can tolerate.   There are many forms such as powders, tablets, wafers, gummies, etc Psyllium bran (Metamucil), methylcellulose (Citrucel), Miralax or Glycolax, Benefiber, Flax Seed.  Adjust the dose week-by-week (1/2 dose/day to 6 doses a day) until you are moving your bowels 1-2 times a day.  Cut back the dose or try a different fiber product if it is giving you problems such as diarrhea or bloating. Sometimes a laxative is needed to help jump-start bowels if constipated until the fiber supplement can help  regulate your bowels.  If you are tolerating eating & you are farting, it is okay to try a gentle laxative such as double dose MiraLax, prune juice, or Milk of Magnesia.  Avoid using laxatives too often. Stool softeners can sometimes help counteract the constipating effects of narcotic pain medicines.  It can also cause diarrhea, so avoid using for too long. If you are still constipated despite taking fiber daily, eating solids, and a few doses of laxatives, call our office. Controlling diarrhea Try drinking liquids and eating bland foods for a few days to avoid stressing your intestines further. Avoid dairy products (especially milk & ice cream) for a short time.  The intestines often can lose the ability to digest lactose when stressed. Avoid foods that cause gassiness or bloating.  Typical foods include beans and other legumes, cabbage, broccoli, and dairy foods.  Avoid greasy, spicy, fast foods.  Every person has some sensitivity to other foods, so listen to your body and avoid those foods that trigger problems for you. Probiotics (such as active yogurt, Align, etc) may help repopulate the intestines and colon with normal bacteria and calm down a sensitive digestive tract Adding a fiber supplement gradually can help thicken stools by absorbing excess fluid and retrain the intestines to act more normally.  Slowly increase the dose over a few weeks.  Too much fiber too soon can backfire and cause cramping & bloating. It is okay to try and slow down diarrhea with a few doses of antidiarrheal medicines.   Bismuth subsalicylate (ex. Kayopectate, Pepto Bismol) for a few doses can help control diarrhea.  Avoid if pregnant.   Loperamide (Imodium) can slow down diarrhea.  Start with one tablet (2mg) first.  Avoid if you are having fevers or severe pain.  ILEOSTOMY PATIENTS WILL HAVE CHRONIC DIARRHEA since their colon is not in use.    Drink plenty of liquids.  You will need to drink even more glasses of  water/liquid a day to avoid getting dehydrated. Record output from your ileostomy.  Expect to empty the bag every 3-4 hours at first.  Most people with a permanent ileostomy empty their bag 4-6 times at the least.   Use antidiarrheal medicine (especially Imodium) several times a day to avoid getting dehydrated.    Start with a dose at bedtime & breakfast.  Adjust up or down as needed.  Increase antidiarrheal medications as directed to avoid emptying the bag more than 8 times a day (every 3 hours). Work with your wound ostomy nurse to learn care for your ostomy.  See ostomy care instructions. TROUBLESHOOTING IRREGULAR BOWELS 1) Start with a soft & bland diet. No spicy, greasy, or fried foods.  2) Avoid gluten/wheat or dairy products from diet to see if symptoms improve. 3) Miralax 17gm or flax seed mixed in [place]. water or juice-daily. May use 2-4 times a day as needed. 4) Gas-X, Phazyme, etc. as needed for gas & bloating.  5) Prilosec (omeprazole) over-the-counter as needed 6)  Consider probiotics (Align, Activa, etc) to help calm the bowels down  Call your doctor if you are getting worse or not getting better.  Sometimes further testing (cultures, endoscopy, X-ray studies, CT scans, bloodwork, etc.) may be needed to help diagnose and treat the cause of the diarrhea. [facility], [place], [address], [place], [place]  [address] [phone] - Main.    [phone]  - Toll Free.   [phone] - Fax [email or web address]   GETTING TO GOOD BOWEL HEALTH.  ######################################################################  EAT Gradually transition to a high fiber diet with a fiber supplement over the next few weeks after discharge.  Start with a pureed / full liquid diet (see below)  WALK Walk an hour a day.  Control your pain to do that.    HAVE A BOWEL MOVEMENT DAILY Keep your bowels regular to avoid problems.  OK to try a laxative to override  constipation.  OK to use an antidairrheal to slow down diarrhea.  Call if not better after 2 tries  CALL IF YOU HAVE PROBLEMS/CONCERNS Call if you are still struggling despite following these instructions. Call if you have concerns not answered by these instructions  ######################################################################   Irregular bowel habits such as constipation and diarrhea can lead to many problems over time.  Having one soft bowel movement a day is the most important way to prevent further problems.  The anorectal canal is designed to handle stretching and feces to safely manage our ability to get rid of solid waste (feces, poop, stool) out of our body.  BUT, hard constipated stools can act like ripping concrete bricks and diarrhea can be a burning fire to this very sensitive area of our body, causing inflamed hemorrhoids, anal fissures, increasing risk is perirectal abscesses, abdominal pain/bloating, an making irritable bowel worse.      The goal: ONE SOFT BOWEL MOVEMENT A DAY!  To have soft, regular bowel movements:  Drink plenty of fluids, consider 4-6 tall glasses of water a day.   Take plenty of fiber.  Fiber is the undigested part of plant food that passes into the colon, acting s "natures broom" to encourage bowel motility and movement.  Fiber can absorb and hold large amounts of water. This results in a larger, bulkier stool, which is soft and easier to pass. Work gradually over several weeks up to 6 servings a day of fiber (25g a day even more if needed) in the form of: Vegetables -- Root (potatoes, carrots, turnips), leafy green (lettuce, salad greens, celery, spinach), or cooked high residue (cabbage, broccoli, etc) Fruit -- Fresh (unpeeled skin & pulp), Dried (prunes, apricots, cherries, etc ),  or stewed ( applesauce)  Whole grain breads, pasta, etc (whole wheat)  Bran cereals  Bulking Agents -- This  type of water-retaining fiber generally is easily obtained  each day by one of the following:  Psyllium bran -- The psyllium plant is remarkable because its ground seeds can retain so much water. This product is available as Metamucil, Konsyl, Effersyllium, Per Diem Fiber, or the less expensive generic preparation in drug and health food stores. Although labeled a laxative, it really is not a laxative.  Methylcellulose -- This is another fiber derived from wood which also retains water. It is available as Citrucel. Polyethylene Glycol - and "artificial" fiber commonly called Miralax or Glycolax.  It is helpful for people with gassy or bloated feelings with regular fiber Flax Seed - a less gassy fiber than psyllium No reading or other relaxing activity while on the toilet. If bowel movements take longer than 5 minutes, you are too constipated AVOID CONSTIPATION.  High fiber and water intake usually takes care of this.  Sometimes a laxative is needed to stimulate more frequent bowel movements, but  Laxatives are not a good long-term solution as it can wear the colon out.  They can help jump-start bowels if constipated, but should be relied on constantly without discussing with your doctor Osmotics (Milk of Magnesia, Fleets phosphosoda, Magnesium citrate, MiraLax, GoLytely) are safer than  Stimulants (Senokot, Castor Oil, Dulcolax, Ex Lax)    Avoid taking laxatives for more than 7 days in a row.  IF SEVERELY CONSTIPATED, try a Bowel Retraining Program: Do not use laxatives.  Eat a diet high in roughage, such as bran cereals and leafy vegetables.  Drink six (6) ounces of prune or apricot juice each morning.  Eat two (2) large servings of stewed fruit each day.  Take one (1) heaping tablespoon of a psyllium-based bulking agent twice a day. Use sugar-free sweetener when possible to avoid excessive calories.  Eat a normal breakfast.  Set aside 15 minutes after breakfast to sit on the toilet, but do not strain to have a bowel movement.  If you do not have a bowel  movement by the third day, use an enema and repeat the above steps.   CONTROLLING DIARRHEA  TAKE A FIBER SUPPLEMENT (FiberCon or Benefiner soluble fiber) twice a day - to thicken stools by absorbing excess fluid and retrain the intestines to act more normally.  Slowly increase the dose over a few weeks.  Too much fiber too soon can backfire and cause cramping & bloating.  TAKE AN IRON SUPPLEMENT twice a day to naturally constipate your bowels.  Usually ferrous sulfate 325mg  twice a day)  TAKE ANTI-DIARRHEAL MEDICINES: Loperamide (Imodium) can slow down diarrhea.  Start with two tablets (= 4mg ) first and then try one tablet every 6 hours.  Can go up to 2 pills four times day (8 pills of 2mg  max) Avoid if you are having fevers or severe pain.  If you are not better or start feeling worse, stop all medicines and call your doctor for advice LoMotil (Diphenoxylate / Atropine) is another medicine that can constipate & slow down bowel moevements Pepto Bismol (bismuth) can gently thicken bowels as well  If diarrhea is worse,: drink plenty of liquids and try simpler foods for a few days to avoid stressing your intestines further. Avoid dairy products (especially milk & ice cream) for a short time.  The intestines often can lose the ability to digest lactose when stressed. Avoid foods that cause gassiness or bloating.  Typical foods include beans and other legumes, cabbage, broccoli, and dairy foods.  Every person has some  sensitivity to other foods, so listen to our body and avoid those foods that trigger problems for you.Call your doctor if you are getting worse or not better.  Sometimes further testing (cultures, endoscopy, X-ray studies, bloodwork, etc) may be needed to help diagnose and treat the cause of the diarrhea. Take extra anti-diarrheal medicines (maximum is 8 pills of 2mg  loperamide a day)  TROUBLESHOOTING IRREGULAR BOWELS 1) Avoid extremes of bowel movements (no bad  constipation/diarrhea) 2) Miralax 17gm mixed in 8oz. water or juice-daily. May use BID as needed.  3) Gas-x,Phazyme, etc. as needed for gas & bloating.  4) Soft,bland diet. No spicy,greasy,fried foods.  5) Prilosec over-the-counter as needed  6) May hold gluten/wheat products from diet to see if symptoms improve.  7)  May try probiotics (Align, Activa, etc) to help calm the bowels down 7) If symptoms become worse call back immediately.

## 2022-07-01 NOTE — Anesthesia Preprocedure Evaluation (Addendum)
Anesthesia Evaluation  General Assessment Comment:Pt sedated  Reviewed: Allergy & Precautions, Patient's Chart, lab work & pertinent test results, Unable to perform ROS - Chart review only  History of Anesthesia Complications (+) DIFFICULT AIRWAYNegative for: history of anesthetic complications  Airway Mallampati: II  TM Distance: >3 FB Neck ROM: Full    Dental  (+) Poor Dentition, Missing, Chipped, Dental Advisory Given   Pulmonary sleep apnea , COPD, former smoker VDRF (sepsis, pneumonia): s/p Trach   breath sounds clear to auscultation       Cardiovascular hypertension, Pt. on medications (-) angina + Valvular Problems/Murmurs  Rhythm:Regular Rate:Normal  12/02/2020 ECHO:    1. Mild intracavitary gradient. Peak velocity 1.37 m/s. Peak gradient 7.59mHg. Left ventricular ejection fraction, by estimation, is 60 to 65%. The left ventricle has normal function. The left ventricle has no regional wall motion abnormalities. There is moderate concentric left ventricular hypertrophy. Left ventricular diastolic parameters are consistent with Grade I diastolic dysfunction (impaired relaxation).   2. Right ventricular systolic function is normal. The right ventricular size is normal. There is normal pulmonary artery systolic pressure.   3. The mitral valve is normal in structure. No evidence of mitral valve regurgitation. No evidence of mitral stenosis.   4. The aortic valve is calcified. There is moderate calcification of the aortic valve. Aortic valve regurgitation is not visualized. No aortic stenosis is present.   5. The inferior vena cava is normal in size with greater than 50% respiratory variability, suggesting right atrial pressure of 3 mmHg.      Neuro/Psych  Headaches, Seizures -,  Frontal brain tumor CVA    GI/Hepatic Neg liver ROS,GERD  Medicated and Controlled,,  Endo/Other  diabetes, Oral Hypoglycemic Agents  Morbid obesity   Renal/GU Renal InsufficiencyRenal disease     Musculoskeletal   Abdominal   Peds  Hematology  (+) Blood dyscrasia (Hb 8.5), anemia   Anesthesia Other Findings   Reproductive/Obstetrics                             Anesthesia Physical Anesthesia Plan  ASA: 3  Anesthesia Plan: General   Post-op Pain Management: Tylenol PO (pre-op)* and Gabapentin PO (pre-op)*   Induction: Intravenous  PONV Risk Score and Plan: 4 or greater and Ondansetron, Treatment may vary due to age or medical condition, Dexamethasone and Midazolam  Airway Management Planned: Video Laryngoscope Planned and Oral ETT  Additional Equipment: None  Intra-op Plan:   Post-operative Plan: Extubation in OR  Informed Consent: I have reviewed the patients History and Physical, chart, labs and discussed the procedure including the risks, benefits and alternatives for the proposed anesthesia with the patient or authorized representative who has indicated his/her understanding and acceptance.     Dental advisory given  Plan Discussed with: CRNA  Anesthesia Plan Comments: (2 x PIV)        Anesthesia Quick Evaluation

## 2022-07-01 NOTE — Op Note (Signed)
07/01/2022  3:37 PM  PATIENT:  Thomas [name]  67 y.o. male  Patient Care Team: [name], NP as PCP - General Benito Mccreedy, MD (Internal Medicine) Patient, No Pcp Per (General Practice) Michael Boston, MD as Consulting Physician (Colon and Rectal Surgery) Irene Shipper, MD as Consulting Physician (Gastroenterology) Ladell Pier, MD as Consulting Physician (Oncology)  PRE-OPERATIVE DIAGNOSIS:  COLOSTOMY FOR COLON RESECTION  POST-OPERATIVE DIAGNOSIS:   COLOSTOMY FOR COLON RESECTION INCARCERATED INCISIONAL HERNIA  PROCEDURE:   TAKEDOWN OF END COLOSTOMY WITH ANASTOMOSIS INTRAOPERATIVE ASSESSMENT OF TISSUE VASCULAR PERFUSION USING ICG (indocyanine green) IMMUNOFLUORESCENCE TRANSVERSUS ABDOMINIS PLANE (TAP) BLOCK - BILATERAL LYSIS OF ADHESIONS x90 MINUTES (50% OF CASE) PRIMARY REPAIR OF INCARCERATED INCISIONAL HERNIA  SURGEON:  Adin Hector, MD  ASSISTANT:  Reather Laurence, MD  An experienced assistant was required given the standard of surgical care given the complexity of the case.  This assistant was needed for exposure, dissection, suction, tissue approximation, retraction, perception, etc  ANESTHESIA:  General endotracheal intubation anesthesia (GETA) and Regional TRANSVERSUS ABDOMINIS PLANE (TAP) nerve block -BILATERAL for perioperative & postoperative pain control at the level of the transverse abdominis & preperitoneal spaces along the flank at the anterior axillary line, from subcostal ridge to iliac crest under laparoscopic guidance provided with liposomal bupivacaine (Experel) 33m mixed with 60 mL of bupivicaine 0.25% with epinephrine  Estimated Blood Loss (EBL):   Total I/O In: 1350 [I.V.:1250; IV Piggyback:100] Out: 250 [Urine:200; Blood:50].   (See anesthesia record)  Delay start of Pharmacological VTE agent (>24hrs) due to concerns of significant anemia, surgical blood loss, or risk of bleeding?:  no  DRAINS: (None)  SPECIMEN:  Colostomy  DISPOSITION  OF SPECIMEN:  Pathology  COUNTS:  Sponge, needle, & instrument counts CORRECT  PLAN OF CARE: Admit to inpatient   PATIENT DISPOSITION:  PACU - hemodynamically stable.  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  Hartman with proximal transverse colostomy.  Prolonged recovery.  He has since recovered.  Had post adjuvant chemotherapy.  It is been almost 2 years.  It was felt he would be safe to proceed with the anatomy & physiology of the digestive tract was discussed.  The pathophysiology was discussed.  Possibility of remaining with an ostomy permanently was discussed.  I offered ostomy takedown.  Laparoscopic & open techniques were discussed.   Risks such as bleeding, infection, abscess, leak, reoperation, possible re-ostomy, injury to other organs, need for repair of tissues / organs, need for further treatment, hernia, heart attack, death, and other risks were discussed.   I noted a good likelihood this will help address the problem.  Goals of post-operative recovery were discussed as well.  We will work to minimize complications.  Questions were answered.  The patient expresses understanding & wishes to proceed with surgery.  The patient expresses understanding & wishes to proceed with surgery.  OR FINDINGS:   Patient had very dense intra-abdominal adhesions of small bowel to all quadrants of the abdomen as well as colon.  He had a loop of small bowel incarcerated in a periumbilical midline incisional hernia 4 x 4 cm.  Reduced and primarily repaired.  No obvious metastatic disease on visceral parietal peritoneum or liver.  It is an isoperistaltic ileocolonic anastomosis (proximal transverse colon to distal transverse colon) that rests in the epigastric region.  CASE DATA:  Type of patient?: Elective [facility] Private Case  Status of Case? Elective Scheduled  Infection Present  At Time Of Surgery (PATOS)?   NO    DESCRIPTION:   Informed consent was confirmed.  The patient underwent general anaesthesia without difficulty.  The patient was positioned with arms tucked & secured appropriately.  Colostomy sutured shut with pursestring of Prolene suture.  VTE prevention in place.  The patient's abdomen was clipped, prepped, & draped in a sterile fashion.  Surgical timeout confirmed our plan.  The patient was positioned in reverse Trendelenburg.  Abdominal entry was gained using Varess technique at the left subcostal ridge on the anterior abdominal wall.  No elevated EtCO2 noted.  Port placed.  Camera inspection revealed no injury.  Extra ports were carefully placed under direct laparoscopic visualization.  We docked the [employer] Vinci robot carefully and placed intstruments under visualization  I does not lysis adhesions.  This took quite some time.  Gradually freed greater omentum of the anterior abdominal wall and small bowel as well.  There is a loop of bowel incarcerated in the central anterior abdominal wall that was carefully reduced down.  4 x 4 cm defect.  Freed off numerous interloop small bowel adhesions from the ileocecal valve to the ligament of Treitz.  This took quite some time.  Adhesions down the pelvis as well as in the left upper quadrant.  Ran the small bowel.  Saw 1 area of serosal defect 5 x 7 mm.  Closed and with a 2-0 Vicryl horizontal mattress suture to good result.  Saw no evidence of any serosal injury or concern.  With that freed off I can identify in the colostomy in the right upper quadrant.  Freed small bowel loops off of that in the mesentery.  Had to free off adhesions of stomach and liver to the anterior abdominal wall.  Work to find the descending colon and splenic flexure and found the staple line of the distal transverse colon intact.  Mobilized the distal transverse colon in a superior to inferior and lateral to medial fashion and got reached towards the falciform ligament.  I  then focused on circumferential dissection around the proximal transverse colon colostomy.  Got good circumferential dissection.  To access vascular perfusion of tissues, we asked anesthesia use intravenous  indocyanine green (ICG) with IV flush.  I switched to the NIR fluorescence (Firefly mode) imaging window on the daVinci robot platform.  We were able to see good light green visualization of blood vessels with good vascular perfusion of tissues, confirming good tissue perfusion of tissues (proximal transverse colon colostomy and distal transverse colon) planned for anastomosis.  I transected the proximal transverse colon as it entered through the anterior abdominal wall fascia using a 60 mm blue load stapler to good result.  The proximal transverse colon reached over towards the splenic flexure to for a nice overlap for an intracorporeal anastomosis.   We did a side-to-side stapled anastomosis of proximal transverse colon to distal transverse colon using a 28m blue load in an isoperistaltic fashion.  (Distal stump of external transverse colon to distal  transverse colon for the distal end of the anastomosis.  Proximal end of colon stump to more proximal transverse colon closer to the hepatic flexure for the proximal end of the anastomosis).  I sewed the common staple channel wound with an absorbable suture (V-lock) in a running CBudafashion from each corner and meeting in the center.  I did meticulous inspection prove an airtight closure.  We protected the anastomosis line with an anterior omentopexy of greater omentum using V lock  suture.    We did reinspection of the abdomen.  Hemostasis was good.   Ureters, retroperitoneum, and bowel uninjured.  The anastomosis looked healthy.   We primarily closed the periumbilical incisional hernia using #1 PDS interrupted suture under direct laparoscopic visualization.  We closed the 12 mm stapler port in the left upper quadrant using a #1 PDS suture as well.   Confirmed our tap block was good and had good fascial block.  Endoluminal gas was evacuated.  Made a circular incision at the squamocolumnar junction of the colostomy.  Came through the dermis and subcutaneous tissues and removed the 3 cm colostomy stump that up and stapled off on the inside.  Hemostasis was good.  Lips changed.  Sterile unused instruments were used from this point.  I closed the skin at the port sites using Monocryl stitch and sterile dressing.  I closed the fascia at the colostomy using a #1 PDS vertical anterior rectal fascial closure.  Port sites closed with formal chromic suture.  Tried to pursestring but did not come together well so I ended up making a transverse bio concave incision to the skin and subcutaneous tissues and closed the colostomy wound transversely with interrupted 3-0 Vicryl deep dermal sutures to good result.  Placed an umbilical tape chlorhexidine soaked to the base of the colostomy wound on both sides today wicks to leave the did not tissues drain for the first few days..  I placed sterile dressings.     Patient is being extubated go to recovery room. I discussed postop care with the patient in detail the office & in the holding area. Instructions are written. I discussed operative findings, updated the patient's status, discussed probable steps to recovery, and gave postoperative recommendations to the patient's spouse, Keagon Hofmann.  Recommendations were made.  Questions were answered.  She expressed understanding & appreciation.   Adin Hector, M.D., F.A.C.S. Gastrointestinal and Minimally Invasive Surgery [facility] [address], [place], [place] [address] [phone] Main / Paging

## 2022-07-02 ENCOUNTER — Encounter (HOSPITAL_COMMUNITY): Payer: Self-pay | Admitting: Surgery

## 2022-07-02 ENCOUNTER — Other Ambulatory Visit (HOSPITAL_COMMUNITY): Payer: Self-pay

## 2022-07-02 LAB — CBC
HCT: 37.2 % — ABNORMAL LOW (ref 39.0–52.0)
Hemoglobin: 12.9 g/dL — ABNORMAL LOW (ref 13.0–17.0)
MCH: 26.6 pg (ref 26.0–34.0)
MCHC: 34.7 g/dL (ref 30.0–36.0)
MCV: 76.7 fL — ABNORMAL LOW (ref 80.0–100.0)
Platelets: 260 10*3/uL (ref 150–400)
RBC: 4.85 MIL/uL (ref 4.22–5.81)
RDW: 14.5 % (ref 11.5–15.5)
WBC: 15.6 10*3/uL — ABNORMAL HIGH (ref 4.0–10.5)
nRBC: 0 % (ref 0.0–0.2)

## 2022-07-02 LAB — BASIC METABOLIC PANEL
Anion gap: 12 (ref 5–15)
BUN: 22 mg/dL (ref 8–23)
CO2: 20 mmol/L — ABNORMAL LOW (ref 22–32)
Calcium: 8.5 mg/dL — ABNORMAL LOW (ref 8.9–10.3)
Chloride: 105 mmol/L (ref 98–111)
Creatinine, Ser: 1.93 mg/dL — ABNORMAL HIGH (ref 0.61–1.24)
GFR, Estimated: 38 mL/min — ABNORMAL LOW (ref >60)
Glucose, Bld: 102 mg/dL — ABNORMAL HIGH (ref 70–99)
Potassium: 4.1 mmol/L (ref 3.5–5.1)
Sodium: 137 mmol/L (ref 135–145)

## 2022-07-02 LAB — MAGNESIUM: Magnesium: 1.6 mg/dL — ABNORMAL LOW (ref 1.7–2.4)

## 2022-07-02 MED ORDER — SODIUM CHLORIDE 0.9% FLUSH
3.0000 mL | Freq: Two times a day (BID) | INTRAVENOUS | Status: DC
  Administered 2022-07-02 – 2022-07-09 (×14): 3 mL via INTRAVENOUS

## 2022-07-02 MED ORDER — SODIUM CHLORIDE 0.9% FLUSH: 3.0000 mL | INTRAVENOUS | Status: DC | PRN

## 2022-07-02 MED ORDER — SODIUM CHLORIDE 0.9 % IV SOLN
250.0000 mL | INTRAVENOUS | Status: DC | PRN
  Administered 2022-07-04: 250 mL via INTRAVENOUS

## 2022-07-02 MED ORDER — LACTATED RINGERS IV BOLUS
1000.0000 mL | Freq: Three times a day (TID) | INTRAVENOUS | Status: AC | PRN
  Administered 2022-07-02 – 2022-07-03 (×2): 1000 mL via INTRAVENOUS

## 2022-07-02 NOTE — TOC CM/SW Note (Signed)
Transition of Care [facility]) Screening Note  Patient Details  Name: Thomas [name] Date of Birth: 04-03-1956  Transition of Care [facility]) CM/SW Contact:    Sherie Don, LCSW Phone Number: 07/02/2022, 12:39 PM  Transition of Care Department [facility]) has reviewed patient and no TOC needs have been identified at this time. We will continue to monitor patient advancement through interdisciplinary progression rounds. If new patient transition needs arise, please place a TOC consult.

## 2022-07-02 NOTE — Evaluation (Signed)
Physical Therapy Evaluation Patient Details Name: Thomas [name] MRN: [id number] DOB: [birth year] Today's Date: 07/02/2022  History of Present Illness  67 yo male admitted 07/01/22  S/P LYSIS OF ADHESIONS ,  PRIMARY REPAIR OF INCARCERATED INCISIONAL HERNIA , TAKEDOWN OF END COLOSTOMY WITH ANASTOMOSIS. PMH, colon and rectal  cancer, colostomy, VDRF with trach 2022,   DVT, afib, DM, HTN, seizure, CVA, fromtal meningioma, S./P lobectomy.  Clinical Impression  Pt admitted with above diagnosis.  Pt currently with functional limitations due to the deficits listed below (see PT Problem List). Pt will benefit from skilled PT to increase their independence and safety with mobility to allow discharge to the venue listed below.     The patient reports mild dizziness initially, able to ambulate x 180' with RW with no further complaints. Patient actually stated that he felt better after ambulating.      Recommendations for follow up therapy are one component of a multi-disciplinary discharge planning process, led by the attending physician.  Recommendations may be updated based on patient status, additional functional criteria and insurance authorization.  Follow Up Recommendations No PT follow up      Assistance Recommended at Discharge PRN  Patient can return home with the following  Assist for transportation;Assistance with cooking/housework;Help with stairs or ramp for entrance    Equipment Recommendations None recommended by PT  Recommendations for Other Services       Functional Status Assessment Patient has had a recent decline in their functional status and demonstrates the ability to make significant improvements in function in a reasonable and predictable amount of time.     Precautions / Restrictions Precautions Precautions: Fall      Mobility  Bed Mobility Overal bed mobility: Needs Assistance Bed Mobility: Rolling, Sidelying to Sit Rolling: Supervision Sidelying to sit:  Supervision       General bed mobility comments: extra time    Transfers Overall transfer level: Needs assistance Equipment used: Rolling walker (2 wheels) Transfers: Sit to/from Stand Sit to Stand: Supervision                Ambulation/Gait Ambulation/Gait assistance: Min guard Gait Distance (Feet): 180 Feet Assistive device: Rolling walker (2 wheels) Gait Pattern/deviations: Step-through pattern Gait velocity: decr     General Gait Details: initrially slow speed, imoroved, no further dizziness  Stairs            Wheelchair Mobility    Modified Rankin (Stroke Patients Only)       Balance Overall balance assessment: Mild deficits observed, not formally tested                                           Pertinent Vitals/Pain Pain Assessment Pain Assessment: Faces Faces Pain Scale: Hurts little more Pain Location: abbdomen Pain Descriptors / Indicators: Discomfort Pain Intervention(s): Monitored during session    Home Living Family/patient expects to be discharged to:: Private residence Living Arrangements: Spouse/significant other;Children Available Help at Discharge: Family;Available PRN/intermittently Type of Home: House Home Access: Stairs to enter Entrance Stairs-Rails: Can reach both Entrance Stairs-Number of Steps: 5   Home Layout: Multi-level;Able to live on main level with bedroom/bathroom Home Equipment: Rolling Walker (2 wheels);Cane - single point;Wheelchair - manual      Prior Function Prior Level of Function : Independent/Modified Independent  Hand Dominance   Dominant Hand: Right    Extremity/Trunk Assessment   Upper Extremity Assessment Upper Extremity Assessment: Overall WFL for tasks assessed    Lower Extremity Assessment Lower Extremity Assessment: Overall WFL for tasks assessed    Cervical / Trunk Assessment Cervical / Trunk Assessment: Normal;Other exceptions Cervical  / Trunk Exceptions: abdominal surgery  Communication   Communication: No difficulties  Cognition Arousal/Alertness: Awake/alert Behavior During Therapy: WFL for tasks assessed/performed Overall Cognitive Status: Within Functional Limits for tasks assessed                                          General Comments      Exercises     Assessment/Plan    PT Assessment Patient needs continued PT services  PT Problem List Decreased knowledge of precautions;Decreased activity tolerance;Decreased mobility       PT Treatment Interventions DME instruction;Functional mobility training;Patient/family education;Gait training;Therapeutic activities;Therapeutic exercise    PT Goals (Current goals can be found in the Care Plan section)  Acute Rehab PT Goals Patient Stated Goal: go home PT Goal Formulation: With patient Time For Goal Achievement: 07/16/22 Potential to Achieve Goals: Good    Frequency Min 3X/week     Co-evaluation               AM-PAC PT "6 Clicks" Mobility  Outcome Measure Help needed turning from your back to your side while in a flat bed without using bedrails?: None Help needed moving from lying on your back to sitting on the side of a flat bed without using bedrails?: None Help needed moving to and from a bed to a chair (including a wheelchair)?: A Little Help needed standing up from a chair using your arms (e.g., wheelchair or bedside chair)?: A Little Help needed to walk in hospital room?: A Little Help needed climbing 3-5 steps with a railing? : A Little 6 Click Score: 20    End of Session   Activity Tolerance: Patient tolerated treatment well Patient left: in chair;with call bell/phone within reach;with chair alarm set Nurse Communication: Mobility status PT Visit Diagnosis: Unsteadiness on feet (R26.81);Difficulty in walking, not elsewhere classified (R26.2);Pain    Time: 0932-1000 PT Time Calculation (min) (ACUTE ONLY): 28  min   Charges:   PT Evaluation $PT Eval Low Complexity: 1 Low PT Treatments $Gait Training: 8-22 mins        [place] Office [phone] Weekend [id number]   Claretha Cooper 07/02/2022, 12:29 PM

## 2022-07-02 NOTE — Progress Notes (Signed)
ISSAH ISCH [id number] 12/01/1955  CARE TEAM:  PCP: [name], NP  Outpatient Care Team: Patient Care Team: [name], NP as PCP - General Benito Mccreedy, MD (Internal Medicine) Patient, No Pcp Per (General Practice) Michael Boston, MD as Consulting Physician (Colon and Rectal Surgery) Irene Shipper, MD as Consulting Physician (Gastroenterology) Ladell Pier, MD as Consulting Physician (Oncology)  Inpatient Treatment Team: Treatment Team: Attending Provider: Michael Boston, MD; Technician: Rutherford Nail, NT; Charge Nurse: Heloise Ochoa, RN; Charge Nurse: Charlyne Petrin, RN; Pharmacist: Lynelle Doctor, [facility]   Problem List:   Principal Problem:   History of colorectal cancer Active Problems:   Colon cancer ([place])   1 Day Post-Op  07/01/2022  POST-OPERATIVE DIAGNOSIS:   COLOSTOMY FOR COLON RESECTION INCARCERATED INCISIONAL HERNIA   PROCEDURE:   TAKEDOWN OF END COLOSTOMY WITH ANASTOMOSIS INTRAOPERATIVE ASSESSMENT OF TISSUE VASCULAR PERFUSION USING ICG (indocyanine green) IMMUNOFLUORESCENCE TRANSVERSUS ABDOMINIS PLANE (TAP) BLOCK - BILATERAL LYSIS OF ADHESIONS x90 MINUTES (50% OF CASE) PRIMARY REPAIR OF INCARCERATED INCISIONAL HERNIA   SURGEON:  Adin Hector, MD  OR FINDINGS:    Patient had very dense intra-abdominal adhesions of small bowel to all quadrants of the abdomen as well as colon.  He had a loop of small bowel incarcerated in a periumbilical midline incisional hernia 4 x 4 cm.  Reduced and primarily repaired.   No obvious metastatic disease on visceral parietal peritoneum or liver.   It is an isoperistaltic ileocolonic anastomosis (proximal transverse colon to distal transverse colon) that rests in the epigastric region.    Assessment  Recovering well so far.  [facility] Stay = 1 days)  Plan:  -ERAS protocol -Tolerating fulls.  Advance diet -Mild lightheadedness.  Hemoglobin stable.  Holding blood pressure medicines.  IV fluid and  follow. -VTE prophylaxis- SCDs, etc -mobilize as tolerated to help recovery -Ostomy wound mostly closed with umbilical tape as a wick.  Camera removed postop day 3 or day of discharge if sooner.  Disposition:  Disposition:  The patient is from: Home  Anticipate discharge to:  Home  Anticipated Date of Discharge is:  February 25,2024    Barriers to discharge:  Pending Clinical improvement (more likely than not)  Patient currently is NOT MEDICALLY STABLE for discharge from the hospital from a surgery standpoint.      I reviewed nursing notes, last 24 h vitals and pain scores, last 48 h intake and output, last 24 h labs and trends, and last 24 h imaging results. I have reviewed this patient's available data, including medical history, events of note, test results, etc as part of my evaluation.  A significant portion of that time was spent in counseling.  Care during the described time interval was provided by me.  This care required moderate level of medical decision making.  07/02/2022    Subjective: (Chief complaint)  Patient feeling okay.  Some soreness controlled.  A little lightheaded getting up but did not pass out.  Tolerating liquids.  Objective:  Vital signs:  Vitals:   07/01/22 2025 07/02/22 0131 07/02/22 0500 07/02/22 0542  BP: (!) 147/81 133/78  121/73  Pulse: 97 100  97  Resp: '15 16  17  '$ Temp: 97.6 F (36.4 C) 98.1 F (36.7 C)  98.3 F (36.8 C)  TempSrc: Oral Oral  Oral  SpO2: 99% 100%  99%  Weight:   88.6 kg   Height:        Last BM Date : 06/30/22  Intake/Output  Yesterday:  02/22 0701 - 02/23 0700 In: 2466.7 [P.O.:420; I.V.:1846.7; IV Piggyback:200] Out: 1050 [Urine:1000; Blood:50] This shift:  No intake/output data recorded.  Bowel function:  Flatus: YES  BM:  No  Drain: (No drain)   Physical Exam:  General: Pt awake/alert in no acute distress Eyes: PERRL, normal EOM.  Sclera clear.  No icterus Neuro: CN II-XII intact w/o  focal sensory/motor deficits. Lymph: No head/neck/groin lymphadenopathy Psych:  No delerium/psychosis/paranoia.  Oriented x 4 HENT: Normocephalic, Mucus membranes moist.  No thrush Neck: Supple, No tracheal deviation.  No obvious thyromegaly Chest: No pain to chest wall compression.  Good respiratory excursion.  No audible wheezing CV:  Pulses intact.  Regular rhythm.  No major extremity edema MS: Normal AROM mjr joints.  No obvious deformity  Abdomen: Soft.  Nondistended.  Mildly tender at incisions only.  No evidence of peritonitis.  No incarcerated hernias.  Ext:   No deformity.  No mjr edema.  No cyanosis Skin: No petechiae / purpurea.  No major sores.  Warm and dry    Results:   Cultures: No results found for this or any previous visit (from the past 720 hour(s)).  Labs: Results for orders placed or performed during the hospital encounter of 07/01/22 (from the past 48 hour(s))  Type and screen [place]     Status: None   Collection Time: 07/01/22 11:05 AM  Result Value Ref Range   ABO/RH(D) B POS    Antibody Screen NEG    Sample Expiration      07/04/2022,2359 Performed at [facility], [place] [address] [place], [place] [address]   Glucose, capillary     Status: None   Collection Time: 07/01/22 11:20 AM  Result Value Ref Range   Glucose-Capillary 75 70 - 99 mg/dL    Comment: Glucose reference range applies only to samples taken after fasting for at least 8 hours.  Glucose, capillary     Status: Abnormal   Collection Time: 07/01/22  3:46 PM  Result Value Ref Range   Glucose-Capillary 129 (H) 70 - 99 mg/dL    Comment: Glucose reference range applies only to samples taken after fasting for at least 8 hours.  Hemoglobin A1c     Status: None   Collection Time: 07/01/22  4:11 PM  Result Value Ref Range   Hgb A1c MFr Bld 5.1 4.8 - 5.6 %    Comment: (NOTE) Pre diabetes:          5.7%-6.4%  Diabetes:              >6.4%  Glycemic  control for   <7.0% adults with diabetes    Mean Plasma Glucose 99.67 mg/dL    Comment: Performed at [place] [address], [place], [place] [id number]  Basic metabolic panel     Status: Abnormal   Collection Time: 07/02/22  4:12 AM  Result Value Ref Range   Sodium 137 135 - 145 mmol/L   Potassium 4.1 3.5 - 5.1 mmol/L   Chloride 105 98 - 111 mmol/L   CO2 20 (L) 22 - 32 mmol/L   Glucose, Bld 102 (H) 70 - 99 mg/dL    Comment: Glucose reference range applies only to samples taken after fasting for at least 8 hours.   BUN 22 8 - 23 mg/dL   Creatinine, Ser 1.93 (H) 0.61 - 1.24 mg/dL   Calcium 8.5 (L) 8.9 - 10.3 mg/dL   GFR, Estimated 38 (L) >  60 mL/min    Comment: (NOTE) Calculated using the CKD-EPI Creatinine Equation (2021)    Anion gap 12 5 - 15    Comment: Performed at [facility], [place] [address] [place], [place] [address]  CBC     Status: Abnormal   Collection Time: 07/02/22  4:12 AM  Result Value Ref Range   WBC 15.6 (H) 4.0 - 10.5 K/uL   RBC 4.85 4.22 - 5.81 MIL/uL   Hemoglobin 12.9 (L) 13.0 - 17.0 g/dL   HCT 37.2 (L) 39.0 - 52.0 %   MCV 76.7 (L) 80.0 - 100.0 fL   MCH 26.6 26.0 - 34.0 pg   MCHC 34.7 30.0 - 36.0 g/dL   RDW 14.5 11.5 - 15.5 %   Platelets 260 150 - 400 K/uL   nRBC 0.0 0.0 - 0.2 %    Comment: Performed at [facility], [place] [address] [place], [place] [address]  Magnesium     Status: Abnormal   Collection Time: 07/02/22  4:12 AM  Result Value Ref Range   Magnesium 1.6 (L) 1.7 - 2.4 mg/dL    Comment: Performed at [facility], [place] [address], [place], [place] [address]    Imaging / Studies: No results found.  Medications / Allergies: per chart  Antibiotics: Anti-infectives (From admission, onward)    Start     Dose/Rate Route Frequency Ordered Stop   07/02/22 0000  cefoTEtan (CEFOTAN) 2 g in sodium chloride 0.9 % 100 mL IVPB        2 g 200 mL/hr over 30 Minutes Intravenous Every  12 hours 07/01/22 1737 07/02/22 0051   07/01/22 1400  neomycin (MYCIFRADIN) tablet 1,000 mg  Status:  Discontinued       See Hyperspace for full Linked Orders Report.   1,000 mg Oral 3 times per day 07/01/22 1054 07/01/22 1109   07/01/22 1400  metroNIDAZOLE (FLAGYL) tablet 1,000 mg  Status:  Discontinued       See Hyperspace for full Linked Orders Report.   1,000 mg Oral 3 times per day 07/01/22 1054 07/01/22 1109   07/01/22 1100  cefoTEtan (CEFOTAN) 2 g in sodium chloride 0.9 % 100 mL IVPB        2 g 200 mL/hr over 30 Minutes Intravenous On call to O.R. 07/01/22 1054 07/01/22 1204         Note: Portions of this report may have been transcribed using voice recognition software. Every effort was made to ensure accuracy; however, inadvertent computerized transcription errors may be present.   Any transcriptional errors that result from this process are unintentional.    Adin Hector, MD, FACS, MASCRS Esophageal, Gastrointestinal & Colorectal Surgery Robotic and Minimally Invasive Surgery  [facility]. [address], [place], [place] [address] [phone] Fax [phone] Main  CONTACT INFORMATION:  Weekday (9AM-5PM): Call [facility] main office at [phone]  Weeknight (5PM-9AM) or Weekend/Holiday: Check [email or web address] (password " [email or web address]") for General Surgery [facility] coverage  (Please, do not use SecureChat as it is not reliable communication to reach operating surgeons for immediate patient care given surgeries/outpatient duties/clinic/cross-coverage/off post-call which would lead to a delay in care.  Epic staff messaging available for outptient concerns, but may not be answered for 48 hours or more).     07/02/2022  7:38 AM

## 2022-07-02 NOTE — Progress Notes (Signed)
Patient's foley catheter removed at 0630. Pt has not voided since removal. Bladder scan showed 63m at 1500. Patient encouraged to increase oral intake.

## 2022-07-03 ENCOUNTER — Inpatient Hospital Stay (HOSPITAL_COMMUNITY): Payer: Medicare PPO

## 2022-07-03 LAB — BASIC METABOLIC PANEL WITH GFR
Anion gap: 7 (ref 5–15)
BUN: 27 mg/dL — ABNORMAL HIGH (ref 8–23)
CO2: 23 mmol/L (ref 22–32)
Calcium: 7.9 mg/dL — ABNORMAL LOW (ref 8.9–10.3)
Chloride: 105 mmol/L (ref 98–111)
Creatinine, Ser: 2.02 mg/dL — ABNORMAL HIGH (ref 0.61–1.24)
GFR, Estimated: 36 mL/min — ABNORMAL LOW
Glucose, Bld: 100 mg/dL — ABNORMAL HIGH (ref 70–99)
Potassium: 3.9 mmol/L (ref 3.5–5.1)
Sodium: 135 mmol/L (ref 135–145)

## 2022-07-03 LAB — POTASSIUM: Potassium: 4.1 mmol/L (ref 3.5–5.1)

## 2022-07-03 LAB — CBC
HCT: 31 % — ABNORMAL LOW (ref 39.0–52.0)
Hemoglobin: 10.6 g/dL — ABNORMAL LOW (ref 13.0–17.0)
MCH: 26.9 pg (ref 26.0–34.0)
MCHC: 34.2 g/dL (ref 30.0–36.0)
MCV: 78.7 fL — ABNORMAL LOW (ref 80.0–100.0)
Platelets: 243 10*3/uL (ref 150–400)
RBC: 3.94 MIL/uL — ABNORMAL LOW (ref 4.22–5.81)
RDW: 14.6 % (ref 11.5–15.5)
WBC: 19.3 10*3/uL — ABNORMAL HIGH (ref 4.0–10.5)
nRBC: 0 % (ref 0.0–0.2)

## 2022-07-03 LAB — BASIC METABOLIC PANEL
Anion gap: 10 (ref 5–15)
BUN: 27 mg/dL — ABNORMAL HIGH (ref 8–23)
CO2: 23 mmol/L (ref 22–32)
Calcium: 8 mg/dL — ABNORMAL LOW (ref 8.9–10.3)
Chloride: 103 mmol/L (ref 98–111)
Creatinine, Ser: 1.95 mg/dL — ABNORMAL HIGH (ref 0.61–1.24)
GFR, Estimated: 37 mL/min — ABNORMAL LOW (ref >60)
Glucose, Bld: 115 mg/dL — ABNORMAL HIGH (ref 70–99)
Potassium: 3.8 mmol/L (ref 3.5–5.1)
Sodium: 136 mmol/L (ref 135–145)

## 2022-07-03 LAB — CREATININE, SERUM
Creatinine, Ser: 2.24 mg/dL — ABNORMAL HIGH (ref 0.61–1.24)
GFR, Estimated: 32 mL/min — ABNORMAL LOW (ref >60)

## 2022-07-03 LAB — HEMOGLOBIN: Hemoglobin: 10.7 g/dL — ABNORMAL LOW (ref 13.0–17.0)

## 2022-07-03 LAB — MRSA NEXT GEN BY PCR, NASAL: MRSA by PCR Next Gen: NOT DETECTED

## 2022-07-03 MED ORDER — ORAL CARE MOUTH RINSE: 15.0000 mL | OROMUCOSAL | Status: DC | PRN

## 2022-07-03 MED ORDER — CHLORHEXIDINE GLUCONATE CLOTH 2 % EX PADS
6.0000 | MEDICATED_PAD | Freq: Every day | CUTANEOUS | Status: DC
  Administered 2022-07-03 – 2022-07-09 (×9): 6 via TOPICAL

## 2022-07-03 MED ORDER — SODIUM CHLORIDE 0.9% FLUSH: 10.0000 mL | INTRAVENOUS | Status: DC | PRN

## 2022-07-03 MED ORDER — SODIUM CHLORIDE 0.9 % IV SOLN: INTRAVENOUS | Status: DC

## 2022-07-03 MED ORDER — PIPERACILLIN-TAZOBACTAM 3.375 G IVPB
3.3750 g | Freq: Three times a day (TID) | INTRAVENOUS | Status: AC
  Administered 2022-07-03 – 2022-07-08 (×14): 3.375 g via INTRAVENOUS
  Filled 2022-07-03 (×13): qty 50

## 2022-07-03 NOTE — Progress Notes (Signed)
Patient still denies feeling of urge to void even after 1L of bolus. 68F Foley catheter was successfully inserted patient educated and shows understanding. We will monitor.

## 2022-07-03 NOTE — Progress Notes (Signed)
NT reported patients oxygen saturation was on 80's and pt was put on 2L of oxygen and now saturating at 91%. W ewill continue to monitor.

## 2022-07-03 NOTE — Progress Notes (Signed)
Pharmacy Antibiotic Note  Thomas [name] is a 67 y.o. male admitted on 07/01/2022 s/p ostomy takedown with itracorporeal anastomosis with firefly, lysis of adhesion and hernia repair. On 2/24, patient with increased oxygen requirement. On chest x-ray, appears to have moderate effusion vs right lower lobe collapse. Pharmacy has been consulted for Zosyn dosing.  Plan: -Zosyn 3.375 g IV q8h extended infusion  Height: '5\' 8"'$  (172.7 cm) Weight: 87.5 kg (192 lb 14.4 oz) IBW/kg (Calculated) : 68.4  Temp (24hrs), Avg:98 F (36.7 C), Min:97.7 F (36.5 C), Max:98.4 F (36.9 C)  Recent Labs  Lab 07/02/22 0412 07/03/22 0538 07/03/22 1123 07/03/22 1523  WBC 15.6*  --   --  19.3*  CREATININE 1.93* 2.24* 2.02* 1.95*    Estimated Creatinine Clearance: 40.6 mL/min (A) (by C-G formula based on SCr of 1.95 mg/dL (H)).    Allergies  Allergen Reactions   Morphine And Related Other (See Comments)    Throat swelling (can tolerate oxycodone/hydrocodone/hydromorphone)    Antimicrobials this admission: Zosyn 2/24 >>  Dose adjustments this admission: NA  Microbiology results: 2/24 MRSA PCR: pending  Thank you for allowing pharmacy to be a part of this patient's care.  Tawnya Crook, PharmD, BCPS Clinical Pharmacist 07/03/2022 4:22 PM

## 2022-07-03 NOTE — Progress Notes (Signed)
Patient was asked if he feels any urge to void patient responded he will try. Patient was then dangled at the bedside and tried to urinate on the urinal patient verbalizes " I can feel it but it won't come out" , patient was assisted to stand up with a walker to try to void again and was unsuccessful. Patient was assisted tot he bathroom and sat there for a good amount of time to try voiding. Patient called and reported he still couldn't void. Patient was encouraged to walk in the hallway, and was able to do half lap. Patient situated back to his room and PRN LR bolus was given. We will continue to monitor.

## 2022-07-03 NOTE — Progress Notes (Signed)
2 Days Post-Op   Subjective/Chief Complaint: Does not feel well today Having nausea, some abdominal pain Not passing any flatus Foley replaced for retention   Objective: Vital signs in last 24 hours: Temp:  [97.7 F (36.5 C)-98.3 F (36.8 C)] 98.3 F (36.8 C) (02/24 0548) Pulse Rate:  [86-94] 94 (02/24 0548) Resp:  [14-18] 16 (02/24 0548) BP: (123-137)/(67-76) 137/76 (02/24 0548) SpO2:  [90 %-94 %] 94 % (02/24 0548) Weight:  [87.5 kg] 87.5 kg (02/24 0715) Last BM Date : 06/30/22  Intake/Output from previous day: 02/23 0701 - 02/24 0700 In: 2972.9 [P.O.:900; I.V.:224.7; IV Piggyback:1848.2] Out: 650 [Urine:650] Intake/Output this shift: No intake/output data recorded.  Exam: Awake and alert Looks mildly uncomfortable  Lab Results:  Recent Labs    07/02/22 0412 07/03/22 0538  WBC 15.6*  --   HGB 12.9* 10.7*  HCT 37.2*  --   PLT 260  --    BMET Recent Labs    07/02/22 0412 07/03/22 0538  NA 137  --   K 4.1 4.1  CL 105  --   CO2 20*  --   GLUCOSE 102*  --   BUN 22  --   CREATININE 1.93* 2.24*  CALCIUM 8.5*  --    PT/INR No results for input(s): "LABPROT", "INR" in the last 72 hours. ABG No results for input(s): "PHART", "HCO3" in the last 72 hours.  Invalid input(s): "PCO2", "PO2"  Studies/Results: No results found.  Anti-infectives: Anti-infectives (From admission, onward)    Start     Dose/Rate Route Frequency Ordered Stop   07/02/22 0000  cefoTEtan (CEFOTAN) 2 g in sodium chloride 0.9 % 100 mL IVPB        2 g 200 mL/hr over 30 Minutes Intravenous Every 12 hours 07/01/22 1737 07/02/22 0051   07/01/22 1400  neomycin (MYCIFRADIN) tablet 1,000 mg  Status:  Discontinued       See Hyperspace for full Linked Orders Report.   1,000 mg Oral 3 times per day 07/01/22 1054 07/01/22 1109   07/01/22 1400  metroNIDAZOLE (FLAGYL) tablet 1,000 mg  Status:  Discontinued       See Hyperspace for full Linked Orders Report.   1,000 mg Oral 3 times per day  07/01/22 1054 07/01/22 1109   07/01/22 1100  cefoTEtan (CEFOTAN) 2 g in sodium chloride 0.9 % 100 mL IVPB        2 g 200 mL/hr over 30 Minutes Intravenous On call to O.R. 07/01/22 1054 07/01/22 1204       Assessment/Plan: s/p Procedure(s): ROBOTIC OSTOMY TAKEDOWN WITH INTRACORPOREAL ANASTOMOSIS WITH FIREFLY (N/A) LYSIS OF ADHESION (N/A) HERNIA REPAIR INGUINAL INCARCERATED (N/A)  POD#2  -Keep foley -Monitor UOP and creatinine closely.  If up further tomorrow, will need internal med consult -Repeat labs in the morning -back diet back down to clears   LOS: 2 days    Coralie Keens MD 07/03/2022

## 2022-07-03 NOTE — Progress Notes (Signed)
Ng tube placed at 58cm, waiting to confirm placement. Pt tolerated well; 800cc ground coffee gastric content out on low intermittent suction.

## 2022-07-03 NOTE — Progress Notes (Signed)
Patient ID: Thomas [name], male   DOB: Sep 15, 1955, 66 y.o.   MRN: [id number]   I was called secondary to his increased requirement for oxygen increased O2 sats.  He was also temporarily slightly less responsive.  His respiratory rate has increased but he is nonlabored in appearance.  I have reviewed his portable chest x-ray which has not been formally read.  It appears to me like he has a moderate effusion versus right lower lobe collapse.  His blood gas shows a pO2 of 63 and a pCO2 of 46 I believe 3 L of oxygen.  Because of his rising creatinine, previous need for dialysis, previous multisystem organ failure, previous tracheostomy, and being deemed a potential difficult intubation by anesthesiology, I believe he needs to be moved to the stepdown unit for closer monitoring.  I will order a CAT scan of his chest without contrast to help determine whether this is indeed collapse versus pleural effusion versus pneumonia from potential aspiration.  Stat CBC and BMP are pending.

## 2022-07-03 NOTE — Significant Event (Signed)
Rapid Response Event Note   Reason for Call :  Lethargy   Initial Focused Assessment:  Patient resting in bed with eyes closed. Responds to voice and maintains eye contact during conversation. Alert and oriented x4, Patient did just receive IV dilaudid at 1352. Per bedside RN he is now more sleepy than this morning. Thomas Linden MD paged prior to arrival and orders placed for chest x ray and ABG- ABG came back w CO2 46.   No noted respiratory distress. On 3L [place] SpO2 90 %. Humidifier added and O2 increased to 95%. Respiratory pattern even and unlabored. Left lobe clear, but right lobe diminished to auscultation. Patient states he did have episode of emesis earlier in the day, but does not think he aspirated.   NSR, HR 90, regular. No adventitious heart sounds heard. Pulses equal bilaterally, no edema noted on assessment.    Interventions:  Transfer to SD, x ray, labs and ABG.    Event Summary:   MD Notified: Thomas Linden MD Call Time: [place] Time: [place]  Thomas Macho, RN

## 2022-07-03 NOTE — Progress Notes (Signed)
Pt continuous vomiting; notified surgery physician, and requested putting NG tube.

## 2022-07-03 NOTE — Progress Notes (Signed)
Paged Dr. Ninfa Linden due to increased SOB, lethargy/sleepiness, and O2 sat 80-85% with 2L. After increasing to 3L pt sat showing 90%. See new orders and transfer to stepdown. Rapid response called. Reported at Bedside to ICU nurse.

## 2022-07-04 ENCOUNTER — Inpatient Hospital Stay (HOSPITAL_COMMUNITY): Payer: Medicare PPO

## 2022-07-04 DIAGNOSIS — J9819 Other pulmonary collapse: Secondary | ICD-10-CM

## 2022-07-04 DIAGNOSIS — R0902 Hypoxemia: Secondary | ICD-10-CM

## 2022-07-04 DIAGNOSIS — Z87891 Personal history of nicotine dependence: Secondary | ICD-10-CM

## 2022-07-04 DIAGNOSIS — J9809 Other diseases of bronchus, not elsewhere classified: Secondary | ICD-10-CM

## 2022-07-04 DIAGNOSIS — Z85048 Personal history of other malignant neoplasm of rectum, rectosigmoid junction, and anus: Secondary | ICD-10-CM | POA: Diagnosis not present

## 2022-07-04 LAB — BLOOD GAS, ARTERIAL
Acid-Base Excess: 1.7 mmol/L (ref 0.0–2.0)
Bicarbonate: 27.2 mmol/L (ref 20.0–28.0)
Drawn by: 560031
O2 Saturation: 93.7 %
Patient temperature: 36.9
pCO2 arterial: 46 mmHg (ref 32–48)
pH, Arterial: 7.38 (ref 7.35–7.45)
pO2, Arterial: 63 mmHg — ABNORMAL LOW (ref 83–108)

## 2022-07-04 LAB — CBC
HCT: 27.6 % — ABNORMAL LOW (ref 39.0–52.0)
Hemoglobin: 9.5 g/dL — ABNORMAL LOW (ref 13.0–17.0)
MCH: 26.1 pg (ref 26.0–34.0)
MCHC: 34.4 g/dL (ref 30.0–36.0)
MCV: 75.8 fL — ABNORMAL LOW (ref 80.0–100.0)
Platelets: 244 10*3/uL (ref 150–400)
RBC: 3.64 MIL/uL — ABNORMAL LOW (ref 4.22–5.81)
RDW: 14.4 % (ref 11.5–15.5)
WBC: 15.7 10*3/uL — ABNORMAL HIGH (ref 4.0–10.5)
nRBC: 0 % (ref 0.0–0.2)

## 2022-07-04 MED ORDER — LEVETIRACETAM IN NACL 500 MG/100ML IV SOLN
500.0000 mg | Freq: Two times a day (BID) | INTRAVENOUS | Status: DC
  Administered 2022-07-04 – 2022-07-11 (×14): 500 mg via INTRAVENOUS
  Filled 2022-07-04 (×14): qty 100

## 2022-07-04 MED ORDER — SODIUM CHLORIDE 3 % IN NEBU
4.0000 mL | INHALATION_SOLUTION | Freq: Two times a day (BID) | RESPIRATORY_TRACT | Status: AC
  Administered 2022-07-04 – 2022-07-06 (×5): 4 mL via RESPIRATORY_TRACT
  Filled 2022-07-04 (×6): qty 4

## 2022-07-04 MED ORDER — PANTOPRAZOLE SODIUM 40 MG IV SOLR
40.0000 mg | INTRAVENOUS | Status: DC
  Administered 2022-07-04 – 2022-07-05 (×2): 40 mg via INTRAVENOUS
  Filled 2022-07-04 (×2): qty 10

## 2022-07-04 NOTE — Progress Notes (Signed)
SLP Cancellation Note  Patient Details Name: Thomas [name] MRN: [id number] DOB: [birth year]   Cancelled treatment:       Reason Eval/Treat Not Completed: Medical issues which prohibited therapy;Other (comment) (Patient NPO with NG secondary to two episodes of emesis. SLP will f/u next date)   [name] Baller, MA, CCC-SLP Speech Therapy

## 2022-07-04 NOTE — Progress Notes (Signed)
Pt went for chest CT; started to have hiccups nonstop.

## 2022-07-04 NOTE — Progress Notes (Signed)
Pt stop having hiccups; 900cc greenish bile emptied this am. Pt tolerating Ng tube and resting.

## 2022-07-04 NOTE — Progress Notes (Signed)
3 Days Post-Op   Subjective/Chief Complaint: Feels better this morning No abdominal pain Had 2 more episodes of emesis last night so NG placed No flatus yet   Objective: Vital signs in last 24 hours: Temp:  [97.7 F (36.5 C)-99.4 F (37.4 C)] 99.4 F (37.4 C) (02/25 0800) Pulse Rate:  [95-115] 101 (02/25 0800) Resp:  [14-24] 22 (02/25 0800) BP: (126-191)/(62-86) 152/74 (02/25 0800) SpO2:  [89 %-98 %] 96 % (02/25 0800) Weight:  [87.6 kg] 87.6 kg (02/25 0500) Last BM Date : 06/30/22  Intake/Output from previous day: 02/24 0701 - 02/25 0700 In: 3423.4 [I.V.:2391.6; IV Piggyback:1031.9] Out: 2625 [Urine:975; Emesis/NG output:1650] Intake/Output this shift: No intake/output data recorded.  Exam: Awake and alert Decreased BS on the right.  No resp distress Abdomen soft, non-distended, minimally tender  Lab Results:  Recent Labs    07/03/22 1523 07/04/22 0500  WBC 19.3* 15.7*  HGB 10.6* 9.5*  HCT 31.0* 27.6*  PLT 243 244   BMET Recent Labs    07/03/22 1123 07/03/22 1523  NA 135 136  K 3.9 3.8  CL 105 103  CO2 23 23  GLUCOSE 100* 115*  BUN 27* 27*  CREATININE 2.02* 1.95*  CALCIUM 7.9* 8.0*   PT/INR No results for input(s): "LABPROT", "INR" in the last 72 hours. ABG Recent Labs    07/03/22 1421  PHART 7.38  HCO3 27.2    Studies/Results: CT CHEST WO CONTRAST  Result Date: 07/04/2022 CLINICAL DATA:  Pneumonia, pleural effusion EXAM: CT CHEST WITHOUT CONTRAST TECHNIQUE: Multidetector CT imaging of the chest was performed following the standard protocol without IV contrast. RADIATION DOSE REDUCTION: This exam was performed according to the departmental dose-optimization program which includes automated exposure control, adjustment of the mA and/or kV according to patient size and/or use of iterative reconstruction technique. COMPARISON:  03/05/2022 FINDINGS: Cardiovascular: Extensive multi-vessel coronary artery calcification. Global cardiac size within  normal limits. No pericardial effusion. Mild atherosclerotic calcification within the thoracic aorta. No aortic aneurysm. Right internal jugular chest port tip is seen at the superior cavoatrial junction. Mediastinum/Nodes: Visualized thyroid is unremarkable. No pathologic thoracic adenopathy. Esophagus unremarkable. Nasogastric tube extends into the proximal body of the stomach. Lungs/Pleura: There is extensive endoluminal debris within the right mainstem bronchus extending into the segmental and subsegmental bronchi which may reflect changes of aspiration or mucous plugging. There is complete collapse of the right middle and lower lobes. Small right pleural effusion. Bronchial wall thickening noted within the upper lobe in keeping with airway inflammation. Mild left basilar atelectasis. Trace left pleural effusion. No pneumothorax. Upper Abdomen: Moderate free intraperitoneal gas noted within the visualized upper abdomen in keeping with history of recent abdominal surgery. Musculoskeletal: No acute bone abnormality. No lytic or blastic bone lesion. IMPRESSION: 1. Extensive endoluminal debris within the right mainstem bronchus extending into the segmental and subsegmental bronchi which may reflect changes of aspiration or mucous plugging. Complete collapse of the right middle and lower lobes. Small right pleural effusion. 2. Bronchial wall thickening within the right upper lobe in keeping with airway inflammation. 3. Extensive multi-vessel coronary artery calcification. 4. Moderate free intraperitoneal gas in keeping with history of recent abdominal surgery. Aortic Atherosclerosis (ICD10-I70.0). Electronically Signed   By: Fidela Salisbury M.D.   On: 07/04/2022 00:47   DG Abd 1 View  Result Date: 07/03/2022 CLINICAL DATA:  Confirm NG tube placement EXAM: ABDOMEN - 1 VIEW COMPARISON:  Chest radiograph earlier today. FINDINGS: Enteric tube tip and side-port in the stomach. Dilated gas-filled  loops of small bowel in  the left hemiabdomen. Elevated right hemidiaphragm with associated atelectasis. Free air in the upper abdomen. Probable right pleural effusion. Accessed right chest wall Port-A-Cath tip in the right atrium. IMPRESSION: 1. Enteric tube tip and side-port in the stomach. 2. Gas-filled dilated loops of small bowel in the left hemiabdomen. 3. Pneumoperitoneum. Electronically Signed   By: Placido Sou M.D.   On: 07/03/2022 23:42   DG CHEST PORT 1 VIEW  Result Date: 07/03/2022 CLINICAL DATA:  Shortness of breath.  Abdominal surgery 2 days ago EXAM: PORTABLE CHEST 1 VIEW COMPARISON:  02/26/2021, 03/05/2022 FINDINGS: Right-sided chest port remains in place. Previously seen tracheostomy tube has been removed. Heart size within normal limits. New elevation of the right hemidiaphragm with dense right basilar opacity. Probable small right pleural effusion. Left lung is clear. No pneumothorax. Large amount of free air is present within the upper abdomen. IMPRESSION: 1. Large amount of free air within the upper abdomen, likely in part related to recent abdominal surgery. Correlation with abdominal exam and follow-up dedicated radiographic abdominal series is recommended. 2. New elevation of the right hemidiaphragm with dense right basilar opacity, likely atelectasis. Probable small right pleural effusion. These results will be called to the ordering clinician or representative by the Radiologist Assistant, and communication documented in the PACS or [employer]. Electronically Signed   By: Davina Poke D.O.   On: 07/03/2022 15:02    Anti-infectives: Anti-infectives (From admission, onward)    Start     Dose/Rate Route Frequency Ordered Stop   07/03/22 1700  piperacillin-tazobactam (ZOSYN) IVPB 3.375 g        3.375 g 12.5 mL/hr over 240 Minutes Intravenous Every 8 hours 07/03/22 1612     07/02/22 0000  cefoTEtan (CEFOTAN) 2 g in sodium chloride 0.9 % 100 mL IVPB        2 g 200 mL/hr over 30 Minutes  Intravenous Every 12 hours 07/01/22 1737 07/02/22 0051   07/01/22 1400  neomycin (MYCIFRADIN) tablet 1,000 mg  Status:  Discontinued       See Hyperspace for full Linked Orders Report.   1,000 mg Oral 3 times per day 07/01/22 1054 07/01/22 1109   07/01/22 1400  metroNIDAZOLE (FLAGYL) tablet 1,000 mg  Status:  Discontinued       See Hyperspace for full Linked Orders Report.   1,000 mg Oral 3 times per day 07/01/22 1054 07/01/22 1109   07/01/22 1100  cefoTEtan (CEFOTAN) 2 g in sodium chloride 0.9 % 100 mL IVPB        2 g 200 mL/hr over 30 Minutes Intravenous On call to O.R. 07/01/22 1054 07/01/22 1204       Assessment/Plan: s/p Procedure(s): ROBOTIC OSTOMY TAKEDOWN WITH INTRACORPOREAL ANASTOMOSIS WITH FIREFLY (N/A) LYSIS OF ADHESION (N/A) HERNIA REPAIR INGUINAL INCARCERATED (N/A)  Likely aspiration. I reviewed the CT showing Right lower lung collapse.  Pulmonary consult requested from Dr. Lake Bells who will see him Will continue NG for now Continue IV antibiotics.  WBC down  LOS: 3 days    Coralie Keens MD 07/04/2022

## 2022-07-04 NOTE — Progress Notes (Signed)
RT note: CPT done with Flutter Device.

## 2022-07-04 NOTE — Progress Notes (Signed)
Patient's abdominal port access points x6 as well as right abdominal incision dressing was removed per order. Right abdominal medial and lateral packing was removed with minimal complication; moderate bleeding occurred with lateral packing removal. Pressure was applied until bleeding stopped, wound was reassessed, pressure dressing composed of NS soaked gauze and ABD, placed over wound with another temporary pressure dressing on top of ABD. Dressing was assessed every 15 minutes, for one hour to ensure bleeding stopped. No further bleeding noted at this time. Dr. Marlou Starks with general surgery notified of this event. All port access points were cleansed with NS and left to open air. Will continue with current plan of care.

## 2022-07-04 NOTE — Consult Note (Signed)
NAME:  Thomas [name], MRN:  [id number], DOB:  [birth year], LOS: 3 ADMISSION DATE:  07/01/2022, CONSULTATION DATE: July 04, 2022 REFERRING MD: Ninfa Linden, CHIEF COMPLAINT: Shortness of breath  History of Present Illness:  67 year old male with a complex surgical history was admitted for an elective robotic ostomy takedown with lysis of adhesions on February 22, postoperatively noted to have right lower lobe collapse and hypoxemia.  Moved to stepdown on February 24 for management of the same.  Pulmonary critical care medicine consulted on February 25 for mucous plugging/right lower lobe collapse and hypoxemia. He tells me that after his diet was advanced on 2/23 he choked on food a few times (both solids and liquids). He doesn't feel short of breath, isn't coughing much.   Pertinent  Medical History  Colon cancer diagnosed in 2022, treated with transverse colectomy which was complicated and required prolonged ICU stay involving temporary hemodialysis, tracheostomy placement. After decannulation SLP rec  Dysphagia 3 (mechanical soft);Thin liquid  Brain tumor status postresection 2018 Acute ischemic left MCA stroke 2015 Depression Diabetes mellitus GERD History of seizures on Keppra Hypertension Hyperlipidemia Sleep apnea not on CPAP CKD 3B  Significant Hospital Events: Including procedures, antibiotic start and stop dates in addition to other pertinent events   2/22 admission, robotic colostomy takedown and lysis of adhesions 2/23 diet advanced 2/24 moved to stepdown unit for hypoxemia, right lower lobe collapse CT chest showed debris in right lower lobe with atelectasis 2/25 pulmonary consult  Interim History / Subjective:  As above  Objective   Blood pressure (!) 152/74, pulse (!) 101, temperature 99.4 F (37.4 C), temperature source Axillary, resp. rate (!) 22, height '5\' 8"'$  (1.727 m), weight 87.6 kg, SpO2 96 %.        Intake/Output Summary (Last 24 hours) at 07/04/2022  0851 Last data filed at 07/04/2022 0505 Gross per 24 hour  Intake 3423.42 ml  Output 2625 ml  Net 798.42 ml   Filed Weights   07/02/22 0500 07/03/22 0715 07/04/22 0500  Weight: 88.6 kg 87.5 kg 87.6 kg    Examination:  General:  Resting comfortably in bed HENT: NCAT OP clear NG in place, old tracheostomy scar PULM: Diminished R base, otherwise clear, normal effort CV: RRR, no mgr GI: minimal bowel sounds, dressings in place MSK: normal bulk and tone Derm: trace edema ankles, port a cath site without redness/drainage Neuro: awake, alert, no distress, MAEW   Resolved Hospital Problem list     Assessment & Plan:  Hypoxemia due to right lower lobe collapse Mucous plugging right lower lobe Dyphagia, aspiration event on 2/23 NPO SLP evaluation Add hypertonic saline neb bid Add chest PT If able to use gut> add guaifenesin Repeat CXR in AM Hold off on bronchoscopy unless hypoxemia or respiratory status worsens Target SaO2 > 90%  Acute on chronic kidney disease CKD stage IIIb IV Saline at 125cc/hr Monitor BMET and UOP Replace electrolytes as needed  Status post lysis of adhesions colostomy takedown NG per primary Wound care/pain meds/nutrition per primary  History of hypertension Hydralazline/metoprolol prn SBP > 160  History of stroke, left MCA 2015 Aspiration risk Seizure disorder Keppra IV  See above re: aspiration  Best Practice (right click and "Reselect all SmartList Selections" daily)   Per Primary  Labs   CBC: Recent Labs  Lab 07/02/22 0412 07/03/22 0538 07/03/22 1523 07/04/22 0500  WBC 15.6*  --  19.3* 15.7*  HGB 12.9* 10.7* 10.6* 9.5*  HCT 37.2*  --  31.0* 27.6*  MCV 76.7*  --  78.7* 75.8*  PLT 260  --  243 [id number]    Basic Metabolic Panel: Recent Labs  Lab 07/02/22 0412 07/03/22 0538 07/03/22 1123 07/03/22 1523  NA 137  --  135 136  K 4.1 4.1 3.9 3.8  CL 105  --  105 103  CO2 20*  --  23 23  GLUCOSE 102*  --  100* 115*  BUN 22  --   27* 27*  CREATININE 1.93* 2.24* 2.02* 1.95*  CALCIUM 8.5*  --  7.9* 8.0*  MG 1.6*  --   --   --    GFR: Estimated Creatinine Clearance: 40.7 mL/min (A) (by C-G formula based on SCr of 1.95 mg/dL (H)). Recent Labs  Lab 07/02/22 0412 07/03/22 1523 07/04/22 0500  WBC 15.6* 19.3* 15.7*    Liver Function Tests: No results for input(s): "AST", "ALT", "ALKPHOS", "BILITOT", "PROT", "ALBUMIN" in the last 168 hours. No results for input(s): "LIPASE", "AMYLASE" in the last 168 hours. No results for input(s): "AMMONIA" in the last 168 hours.  ABG    Component Value Date/Time   PHART 7.38 07/03/2022 1421   PCO2ART 46 07/03/2022 1421   PO2ART 63 (L) 07/03/2022 1421   HCO3 27.2 07/03/2022 1421   TCO2 18 (L) 09/29/2021 0913   ACIDBASEDEF 3.0 (H) 02/12/2021 0549   O2SAT 93.7 07/03/2022 1421     Coagulation Profile: No results for input(s): "INR", "PROTIME" in the last 168 hours.  Cardiac Enzymes: No results for input(s): "CKTOTAL", "CKMB", "CKMBINDEX", "TROPONINI" in the last 168 hours.  HbA1C: Hgb A1c MFr Bld  Date/Time Value Ref Range Status  07/01/2022 04:11 PM 5.1 4.8 - 5.6 % Final    Comment:    (NOTE) Pre diabetes:          5.7%-6.4%  Diabetes:              >6.4%  Glycemic control for   <7.0% adults with diabetes   06/16/2022 10:35 AM 5.2 4.8 - 5.6 % Final    Comment:    (NOTE) Pre diabetes:          5.7%-6.4%  Diabetes:              >6.4%  Glycemic control for   <7.0% adults with diabetes     CBG: Recent Labs  Lab 07/01/22 1120 07/01/22 1546  GLUCAP 75 129*    Review of Systems:   Gen: Denies fever, chills, weight change, fatigue, night sweats HEENT: Denies blurred vision, double vision, hearing loss, tinnitus, sinus congestion, rhinorrhea, sore throat, neck stiffness, dysphagia PULM: per HPI CV: Denies chest pain, edema, orthopnea, paroxysmal nocturnal dyspnea, palpitations GI: per HPI GU: Denies dysuria, hematuria, polyuria, oliguria, urethral  discharge Endocrine: Denies hot or cold intolerance, polyuria, polyphagia or appetite change Derm: Denies rash, dry skin, scaling or peeling skin change Heme: Denies easy bruising, bleeding, bleeding gums Neuro: Denies headache, numbness, weakness, slurred speech, loss of memory or consciousness   Past Medical History:  He,  has a past medical history of Acute ischemic left MCA stroke ([place]) (04/09/2014), Acute respiratory failure with hypoxia ([facility]) (04/09/2014), Anxiety, Brain tumor ([place]), Chronic kidney disease, Colon cancer ([place]), Confusion, Depression, Diabetes mellitus without complication ([place]), Dizziness, Epilepsy ([place]), GERD (gastroesophageal reflux disease), Headache, Heart murmur, Hyperlipidemia, Hypertension, Peripheral edema, Peripheral neuropathy, Seizures ([place]), Sleep apnea, and Urinary frequency.   Surgical History:   Past Surgical History:  Procedure Laterality Date   BIOPSY  09/05/2020   Procedure: BIOPSY;  Surgeon: Irene Shipper, MD;  Location: [facility] ENDOSCOPY;  Service: Endoscopy;;   BIOPSY  02/06/2021   Procedure: BIOPSY;  Surgeon: Jerene Bears, MD;  Location: [facility] ENDOSCOPY;  Service: Gastroenterology;;   CARDIAC CATHETERIZATION  7 yrs ago   COLONOSCOPY WITH PROPOFOL N/A 09/05/2020   Procedure: COLONOSCOPY WITH PROPOFOL;  Surgeon: Irene Shipper, MD;  Location: [facility] ENDOSCOPY;  Service: Endoscopy;  Laterality: N/A;   CRANIOTOMY N/A 09/01/2016   Procedure: CRANIOTOMY TUMOR  LEFT PTERIONAL;  Surgeon: Ashok Pall, MD;  Location: [place];  Service: Neurosurgery;  Laterality: N/A;   cyst removed from chest      as a child   ESOPHAGOGASTRODUODENOSCOPY (EGD) WITH PROPOFOL N/A 02/06/2021   Procedure: ESOPHAGOGASTRODUODENOSCOPY (EGD) WITH PROPOFOL;  Surgeon: Jerene Bears, MD;  Location: [facility] ENDOSCOPY;  Service: Gastroenterology;  Laterality: N/A;   INGUINAL HERNIA REPAIR N/A 07/01/2022   Procedure: HERNIA REPAIR INGUINAL INCARCERATED;  Surgeon: Michael Boston, MD;  Location: [facility];  Service:  General;  Laterality: N/A;   IR FLUORO GUIDE CV LINE LEFT  12/22/2020   IR GASTROSTOMY TUBE MOD SED  02/10/2021   IR GASTROSTOMY TUBE REMOVAL  04/15/2021   IR REMOVAL TUN CV CATH W/O FL  03/09/2021   IR US GUIDE VASC ACCESS LEFT  12/22/2020   LYSIS OF ADHESION N/A 07/01/2022   Procedure: LYSIS OF ADHESION;  Surgeon: Michael Boston, MD;  Location: [facility];  Service: General;  Laterality: N/A;   NO PAST SURGERIES     PARTIAL COLECTOMY N/A 09/10/2020   Procedure: TRANSVERSE COLECTOMY;  Surgeon: [name], MD;  Location: [place];  Service: General;  Laterality: N/A;   POLYPECTOMY  09/05/2020   Procedure: POLYPECTOMY;  Surgeon: Irene Shipper, MD;  Location: [place];  Service: Endoscopy;;   PORTACATH PLACEMENT Right 10/01/2020   Procedure: INSERTION PORT-A-CATH;  Surgeon: [name], MD;  Location: [place];  Service: General;  Laterality: Right;   SUBMUCOSAL TATTOO INJECTION  09/05/2020   Procedure: SUBMUCOSAL TATTOO INJECTION;  Surgeon: Irene Shipper, MD;  Location: [place];  Service: Endoscopy;;   TRACHEOSTOMY TUBE PLACEMENT N/A 01/05/2021   Procedure: TRACHEOSTOMY;  Surgeon: [name], MD;  Location: [place];  Service: ENT;  Laterality: N/A;   XI ROBOTIC ASSISTED COLOSTOMY TAKEDOWN N/A 07/01/2022   Procedure: ROBOTIC OSTOMY TAKEDOWN WITH INTRACORPOREAL ANASTOMOSIS WITH FIREFLY;  Surgeon: Michael Boston, MD;  Location: [facility];  Service: General;  Laterality: N/A;     Social History:   reports that he has quit smoking. He has never used smokeless tobacco. He reports that he does not drink alcohol and does not use drugs.   Family History:  His family history includes Breast cancer in his maternal aunt and mother; Prostate cancer in his maternal uncle. There is no history of Colon cancer, Esophageal cancer, Rectal cancer, or Stomach cancer.   Allergies Allergies  Allergen Reactions   Morphine And Related Other (See Comments)    Throat swelling (can tolerate  oxycodone/hydrocodone/hydromorphone)     Home Medications  Prior to Admission medications   Medication Sig Start Date End Date Taking? Authorizing Provider  acetaminophen (TYLENOL) 500 MG tablet Take 500-1,000 mg by mouth every 6 (six) hours as needed (pain.).   Yes [provider]  allopurinol (ZYLOPRIM) 100 MG tablet Take 100 mg by mouth at bedtime. 02/15/22  Yes [provider]  amLODipine (NORVASC) 10 MG tablet Take 10 mg by mouth in the morning. 05/11/20  Yes [provider]  atorvastatin (LIPITOR) 20 MG tablet Take 20 mg by mouth in the morning.   Yes [provider]  Emollient (UDDERLY SMOOTH EX) Apply 1 Application topically as needed (dry/irritated skin).   Yes [provider]  gabapentin (NEURONTIN) 100 MG capsule Take 100-200 mg by mouth See admin instructions. Take 1 capsule (100 mg) by mouth in the morning & take 2 capsules (200 mg) by mouth in the evening.   Yes [provider]  levETIRAcetam (KEPPRA) 500 MG tablet Take 500 mg by mouth 2 (two) times daily. 08/27/21  Yes [provider]  LORazepam (ATIVAN) 0.5 MG tablet Take 0.5 mg by mouth 2 (two) times daily as needed for anxiety.   Yes [provider]  omeprazole (PRILOSEC) 20 MG capsule Take 20 mg by mouth in the morning and at bedtime.   Yes [provider]  traMADol (ULTRAM) 50 MG tablet Take 1-2 tablets (50-100 mg total) by mouth every 6 (six) hours as needed for moderate pain or severe pain. 07/01/22  Yes Michael Boston, MD  loperamide (IMODIUM) 2 MG capsule Take 1 capsule (2 mg total) by mouth as needed for diarrhea or loose stools. 03/11/21   Samella Parr, NP     Critical care time: n/a     Roselie Awkward, MD [place] [facility] Pager: [phone] Cell: [phone] After 7:00 pm call Elink  [phone]

## 2022-07-05 ENCOUNTER — Inpatient Hospital Stay (HOSPITAL_COMMUNITY): Payer: Medicare PPO

## 2022-07-05 DIAGNOSIS — R410 Disorientation, unspecified: Secondary | ICD-10-CM

## 2022-07-05 LAB — GLUCOSE, CAPILLARY
Glucose-Capillary: 72 mg/dL (ref 70–99)
Glucose-Capillary: 70 mg/dL (ref 70–99)
Glucose-Capillary: 79 mg/dL (ref 70–99)
Glucose-Capillary: 97 mg/dL (ref 70–99)
Glucose-Capillary: 108 mg/dL — ABNORMAL HIGH (ref 70–99)

## 2022-07-05 LAB — BASIC METABOLIC PANEL
Anion gap: 6 (ref 5–15)
BUN: 21 mg/dL (ref 8–23)
CO2: 26 mmol/L (ref 22–32)
Calcium: 7.6 mg/dL — ABNORMAL LOW (ref 8.9–10.3)
Chloride: 111 mmol/L (ref 98–111)
Creatinine, Ser: 1.47 mg/dL — ABNORMAL HIGH (ref 0.61–1.24)
GFR, Estimated: 53 mL/min — ABNORMAL LOW (ref >60)
Glucose, Bld: 115 mg/dL — ABNORMAL HIGH (ref 70–99)
Potassium: 2.9 mmol/L — ABNORMAL LOW (ref 3.5–5.1)
Sodium: 143 mmol/L (ref 135–145)

## 2022-07-05 LAB — CBC
HCT: 25.3 % — ABNORMAL LOW (ref 39.0–52.0)
Hemoglobin: 8.6 g/dL — ABNORMAL LOW (ref 13.0–17.0)
MCH: 26.2 pg (ref 26.0–34.0)
MCHC: 34 g/dL (ref 30.0–36.0)
MCV: 77.1 fL — ABNORMAL LOW (ref 80.0–100.0)
Platelets: 276 10*3/uL (ref 150–400)
RBC: 3.28 MIL/uL — ABNORMAL LOW (ref 4.22–5.81)
RDW: 14.3 % (ref 11.5–15.5)
WBC: 13.4 10*3/uL — ABNORMAL HIGH (ref 4.0–10.5)
nRBC: 0 % (ref 0.0–0.2)

## 2022-07-05 LAB — HEMOGLOBIN: Hemoglobin: 8.7 g/dL — ABNORMAL LOW (ref 13.0–17.0)

## 2022-07-05 LAB — MAGNESIUM: Magnesium: 2.2 mg/dL (ref 1.7–2.4)

## 2022-07-05 LAB — SURGICAL PATHOLOGY

## 2022-07-05 MED ORDER — POTASSIUM CHLORIDE 10 MEQ/50ML IV SOLN
10.0000 meq | INTRAVENOUS | Status: AC
  Administered 2022-07-05 – 2022-07-06 (×6): 10 meq via INTRAVENOUS
  Filled 2022-07-05 (×3): qty 50

## 2022-07-05 MED ORDER — METOCLOPRAMIDE HCL 5 MG/ML IJ SOLN
10.0000 mg | Freq: Three times a day (TID) | INTRAMUSCULAR | Status: DC
  Administered 2022-07-05 – 2022-07-08 (×9): 10 mg via INTRAVENOUS
  Filled 2022-07-05 (×10): qty 2

## 2022-07-05 MED ORDER — INSULIN ASPART 100 UNIT/ML IJ SOLN: 0.0000 [IU] | INTRAMUSCULAR | Status: DC

## 2022-07-05 MED ORDER — LORAZEPAM 2 MG/ML IJ SOLN
0.5000 mg | INTRAMUSCULAR | Status: DC | PRN
  Administered 2022-07-05 – 2022-07-10 (×12): 0.5 mg via INTRAVENOUS
  Filled 2022-07-05 (×12): qty 1

## 2022-07-05 MED ORDER — DEXTROSE IN LACTATED RINGERS 5 % IV SOLN: INTRAVENOUS | Status: AC

## 2022-07-05 MED ORDER — POTASSIUM CHLORIDE 10 MEQ/100ML IV SOLN: 10.0000 meq | INTRAVENOUS | Status: DC

## 2022-07-05 MED ORDER — PANTOPRAZOLE SODIUM 40 MG IV SOLR
40.0000 mg | Freq: Two times a day (BID) | INTRAVENOUS | Status: DC
  Administered 2022-07-05: 40 mg via INTRAVENOUS
  Filled 2022-07-05: qty 10

## 2022-07-05 MED ORDER — HALOPERIDOL LACTATE 5 MG/ML IJ SOLN
2.0000 mg | Freq: Every day | INTRAMUSCULAR | Status: DC
  Administered 2022-07-05 – 2022-07-10 (×6): 2 mg via INTRAVENOUS
  Filled 2022-07-05 (×6): qty 1

## 2022-07-05 NOTE — Progress Notes (Signed)
RT holding CPT. Patient just finishing with PT.

## 2022-07-05 NOTE — Progress Notes (Addendum)
NAME:  Thomas [name], MRN:  [id number], DOB:  September 03, 1955, LOS: 4 ADMISSION DATE:  07/01/2022, CONSULTATION DATE: July 04, 2022 REFERRING MD: Ninfa Linden, CHIEF COMPLAINT: Shortness of breath  History of Present Illness:  67 year old male with a complex surgical history was admitted for an elective robotic ostomy takedown with lysis of adhesions on February 22, postoperatively noted to have right lower lobe collapse and hypoxemia.  Moved to stepdown on February 24 for management of the same.  Pulmonary critical care medicine consulted on February 25 for mucous plugging/right lower lobe collapse and hypoxemia. He tells me that after his diet was advanced on 2/23 he choked on food a few times (both solids and liquids). He doesn't feel short of breath, isn't coughing much.   Pertinent  Medical History  Colon cancer diagnosed in 2022, treated with transverse colectomy which was complicated and required prolonged ICU stay involving temporary hemodialysis, tracheostomy placement. 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   Interim History / Subjective:  No distress  Objective   Blood pressure (Abnormal) 155/59, pulse 96, temperature 98.2 F (36.8 C), temperature source Oral, resp. rate 17, height '5\' 8"'$  (1.727 m), weight 86.5 kg, SpO2 99 %.    FiO2 (%):  [32 %] 32 %   Intake/Output  Summary (Last 24 hours) at 07/05/2022 0819 Last data filed at 07/05/2022 0704 Gross per 24 hour  Intake 3525.57 ml  Output 1715 ml  Net 1810.57 ml   Filed Weights   07/03/22 0715 07/04/22 0500 07/05/22 0500  Weight: 87.5 kg 87.6 kg 86.5 kg    Examination:  Thomas resting comfortably  HENT NCAT no JVD  Pulm clear. PCXR w/ sig improvement of right sided volume loss atx Card rrr Abd soft. Rates discomfort at 5 of 10. Dressing CD&I Ext warm and dry  Neuro intact    Resolved Hospital Problem list     Assessment & Plan:  Hypoxemia due to right lower lobe collapse Mucous plugging right lower lobe Dyphagia, aspiration event on 2/23 CXR much improved Plan Keep NPO Cont IS and flutter Cont HT saline neb Cont NGT Wean oxygen  Mobilize  Day 3 of 5 zosyn   Acute on chronic kidney disease CKD stage IIIb Plan Cont IV Saline at 125cc/hr F/u am chem  Renal dose meds Avoid hypotension   Status post lysis of adhesions colostomy takedown Plan NG per primary Wound care/pain meds/nutrition per primary  History of hypertension and h/o heart murmur  Plan Hydralazline/metoprolol prn SBP > 160 Resume orals when OK w/ surgical team  Resume his Norvasc when he can take POs  H/o DM Plan Ssi  Very Sens scale  History of stroke, left MCA 2015 Aspiration risk Seizure disorder Plan Keppra IV  See above re: aspiration  Best Practice (right click and "Reselect all SmartList Selections" daily)   Per Primary   Critical care time: [place] ACNP-BC [place] Pager # [phone]  OR # [phone] if no answer

## 2022-07-05 NOTE — Progress Notes (Signed)
   07/05/22 1600  Chest Physiotherapy Tx  CPT Delivery Source Other (Comment) (Pt was vomiting, hold CPT, contraindicated.  RN at the bedside.)  CPT Chest Site  (contraindicated.)

## 2022-07-05 NOTE — Progress Notes (Signed)
SLP Cancellation Note  Patient Details Name: Thomas [name] MRN: [id number] DOB: 10/01/1955   Cancelled treatment:       Reason Eval/Treat Not Completed: Patient not medically ready Patient continues with NG and NPO except ice chips per surgery. SLP will f/u next date.   [name] Baller, MA, CCC-SLP Speech Therapy

## 2022-07-05 NOTE — Progress Notes (Signed)
eLink Physician-Brief Progress Note Patient Name: Thomas [name] DOB: 07-31-1955 MRN: [id number]   Date of Service  07/05/2022  HPI/Events of Note  Hypokalemia - K+ = 2.9 and Creatinine = 1.47.  eICU Interventions  Will replace K+.     Intervention Category Major Interventions: Electrolyte abnormality - evaluation and management  Izyan Ezzell Cornelia Copa 07/05/2022, 7:32 PM

## 2022-07-05 NOTE — Progress Notes (Signed)
Physical Therapy Treatment Patient Details Name: Thomas [name] MRN: [id number] DOB: Apr 11, 1956 Today's Date: 07/05/2022   History of Present Illness 67 yo male admitted 07/01/22  S/P LYSIS OF ADHESIONS ,  PRIMARY REPAIR OF INCARCERATED INCISIONAL HERNIA , TAKEDOWN OF END COLOSTOMY WITH ANASTOMOSIS. PMH, colon and rectal  cancer, colostomy, VDRF with trach 2022,   DVT, afib, DM, HTN, seizure, CVA, fromtal meningioma, S./P lobectomy.2/24 moved to stepdown unit for hypoxemia, right lower lobe collapse CT chest showed debris in right lower lobe with atelectasis    PT Comments    The patient is willing to sit up on bed edge, did stand and stepped x 4 along bed edge. Patient on 2 L, SPO2 >90%, HR 114, BP 184/81 Continue PT for mobility, patient plans to return home .   Recommendations for follow up therapy are one component of a multi-disciplinary discharge planning process, led by the attending physician.  Recommendations may be updated based on patient status, additional functional criteria and insurance authorization.  Follow Up Recommendations  Home health PT     Assistance Recommended at Discharge Set up Supervision/Assistance  Patient can return home with the following Assist for transportation;Assistance with cooking/housework;Help with stairs or ramp for entrance;A little help with walking and/or transfers   Equipment Recommendations  None recommended by PT    Recommendations for Other Services       Precautions / Restrictions Precautions Precautions: Fall Precaution Comments: NG suction     Mobility  Bed Mobility Overal bed mobility: Needs Assistance Bed Mobility: Rolling, Sidelying to Sit, Sit to Sidelying Rolling: Supervision Sidelying to sit: Min assist     Sit to sidelying: Min assist General bed mobility comments: extra time, assiting for NG tube safety    Transfers Overall transfer level: Needs assistance Equipment used: Rolling walker (2 wheels) Transfers: Sit  to/from Stand Sit to Stand: Min assist, +2 safety/equipment           General transfer comment: cues for safety, able to rise to stand and step x 4 along bed edge, then abck to bed    Ambulation/Gait                   Stairs             Wheelchair Mobility    Modified Rankin (Stroke Patients Only)       Balance Overall balance assessment: Needs assistance Sitting-balance support: Bilateral upper extremity supported, Feet supported Sitting balance-Leahy Scale: Fair     Standing balance support: Bilateral upper extremity supported, Reliant on assistive device for balance, During functional activity Standing balance-Leahy Scale: Fair                              Cognition Arousal/Alertness: Awake/alert Behavior During Therapy: WFL for tasks assessed/performed Overall Cognitive Status: Within Functional Limits for tasks assessed                                          Exercises      General Comments        Pertinent Vitals/Pain Pain Assessment Faces Pain Scale: Hurts little more Pain Location: abbdomen Pain Descriptors / Indicators: Discomfort Pain Intervention(s): Monitored during session    Home Living  Prior Function            PT Goals (current goals can now be found in the care plan section) Progress towards PT goals: Progressing toward goals    Frequency    Min 3X/week      PT Plan Current plan remains appropriate    Co-evaluation              AM-PAC PT "6 Clicks" Mobility   Outcome Measure  Help needed turning from your back to your side while in a flat bed without using bedrails?: A Little Help needed moving from lying on your back to sitting on the side of a flat bed without using bedrails?: A Little Help needed moving to and from a bed to a chair (including a wheelchair)?: A Little Help needed standing up from a chair using your arms (e.g.,  wheelchair or bedside chair)?: A Little Help needed to walk in hospital room?: A Lot Help needed climbing 3-5 steps with a railing? : A Lot 6 Click Score: 16    End of Session Equipment Utilized During Treatment: Oxygen Activity Tolerance: Patient tolerated treatment well Patient left: in bed;with call bell/phone within reach;with bed alarm set;with nursing/sitter in room;with family/visitor present Nurse Communication: Mobility status PT Visit Diagnosis: Unsteadiness on feet (R26.81);Difficulty in walking, not elsewhere classified (R26.2);Pain     Time: [id number] PT Time Calculation (min) (ACUTE ONLY): 32 min  Charges:  $Therapeutic Activity: 23-37 mins                     [place] Office [phone] Weekend [id number]    Claretha Cooper 07/05/2022, 1:13 PM

## 2022-07-05 NOTE — Progress Notes (Addendum)
Thomas [name]:[id number] 01-13-1956  CARE TEAM:  PCP: [name], NP  Outpatient Care Team: Patient Care Team: [name], NP as PCP - General Benito Mccreedy, MD (Internal Medicine) Patient, No Pcp Per (General Practice) Michael Boston, MD as Consulting Physician (Colon and Rectal Surgery) Irene Shipper, MD as Consulting Physician (Gastroenterology) Ladell Pier, MD as Consulting Physician (Oncology)  Inpatient Treatment Team: Treatment Team: Attending Provider: Michael Boston, MD; Rounding Team: Pccm, Md, MD; Registered Nurse: Lucie Leather, RN; Registered Nurse: Francesco Runner, RN; Charge Nurse: Lamar Laundry, RN; Pharmacist: Lenis Noon, [facility]; Technician: Renata Caprice, NT; Physical Therapist: Fuller Mandril, PT; Physical Therapist: Adair Patter, PT; Utilization Review: Antionette Char, RN   Problem List:   Principal Problem:   History of colorectal cancer Active Problems:   Colon cancer ([place])   4 Days Post-Op  07/01/2022  POST-OPERATIVE DIAGNOSIS:   COLOSTOMY FOR COLON RESECTION INCARCERATED INCISIONAL HERNIA   PROCEDURE:   TAKEDOWN OF END COLOSTOMY WITH ANASTOMOSIS INTRAOPERATIVE ASSESSMENT OF TISSUE VASCULAR PERFUSION USING ICG (indocyanine green) IMMUNOFLUORESCENCE TRANSVERSUS ABDOMINIS PLANE (TAP) BLOCK - BILATERAL LYSIS OF ADHESIONS x90 MINUTES (50% OF CASE) PRIMARY REPAIR OF INCARCERATED INCISIONAL HERNIA   SURGEON:  Adin Hector, MD  OR FINDINGS:    Patient had very dense intra-abdominal adhesions of small bowel to all quadrants of the abdomen as well as colon.  He had a loop of small bowel incarcerated in a periumbilical midline incisional hernia 4 x 4 cm.  Reduced and primarily repaired.   No obvious metastatic disease on visceral parietal peritoneum or liver.   It is an isoperistaltic ileocolonic anastomosis (proximal transverse colon to distal transverse colon) that rests in the epigastric region.     Assessment  Probable postop ileus with aspiration pneumonitis.  [facility] Stay = 4 days)  Plan: -NG tube and bowel rest.  Overdue for labs to be checked.  Seems to have decent urine output.  Would like to keep him on the dry side from an abdominal/ileus standpoint.  I would suspect pulmonary would like to keep him on the dry side as well.  Consult nephrology if creatinine worsening or urine output worse.  Hopefully not as likely.  -I would not trust his digestive tract for any pills.  Please do not administer enteral meds p.o./NG.  Keep it to low intermittent wall suction only have a sense of return of bowel function.  Parenteral nutrition if not improved by postop day 7.  Overdue for lab studies.  I ordered him this morning.  IV antibiotics per pulmonary critical care.  Current concern for some plugging.  Pulmonary critical care following.  Trying chest physical therapy before considering bronchoscopy.  Pulmonary status seems to be stable.  Some acute blood loss anemia in setting of chronic anemia of chronic disease and iron deficiency anemia.  Not hypotensive.  Follow.  -VTE prophylaxis- SCDs, etc  -mobilize as tolerated to help recovery    Disposition:  Disposition:  The patient is from: Home  Anticipate discharge to:  [facility] (SNF)  Anticipated Date of Discharge is:  MArch 3,2024    Barriers to discharge:  Pending Clinical improvement (more likely than not)  Patient currently is NOT MEDICALLY STABLE for discharge from the hospital from a surgery standpoint.      I reviewed nursing notes, last 24 h vitals and pain scores, last 48 h intake and output, last 24 h labs and trends, and last 24  h imaging results. I have reviewed this patient's available data, including medical history, events of note, test results, etc as part of my evaluation.  A significant portion of that time was spent in counseling.  Care during the described time interval was  provided by me.  This care required moderate level of medical decision making.  07/05/2022    Subjective: (Chief complaint)  Events noted over the weekend.  Feeling a little tired.  Weaned down to minimal nasal cannula oxygen.  ICU nursing on room.  Doing chest physical therapy before bronchoscopy over concerns of mucous plugging.  Pain controlled.  No flatus.  Objective:  Vital signs:  Vitals:   07/05/22 0339 07/05/22 0400 07/05/22 0500 07/05/22 0600  BP:  (!) 166/101 (!) 178/78 (!) 153/63  Pulse:  94 97 95  Resp:  '15 16 13  '$ Temp: 98.2 F (36.8 C) 98.2 F (36.8 C)    TempSrc: Oral Oral    SpO2:  97% 99% 99%  Weight:   86.5 kg   Height:        Last BM Date : 06/30/22  Intake/Output   Yesterday:  02/25 0701 - 02/26 0700 In: 3525.6 [I.V.:3168.5; IV Piggyback:357.1] Out: [id number] [Urine:1105; Emesis/NG output:760] This shift:  No intake/output data recorded.  Bowel function:  Flatus: No  BM:  No  Drain: (No drain)   Physical Exam:  General: Pt awake/alert in no acute distress.  A little tired but not toxic. Eyes: PERRL, normal EOM.  Sclera clear.  No icterus Neuro: CN II-XII intact w/o focal sensory/motor deficits. Lymph: No head/neck/groin lymphadenopathy Psych:  No delerium/psychosis/paranoia.  Oriented x 4 HENT: Normocephalic, Mucus membranes moist.  No thrush Neck: Supple, No tracheal deviation.  No obvious thyromegaly Chest: No pain to chest wall compression.  Good respiratory excursion.  No audible wheezing CV:  Pulses intact.  Regular rhythm.  No major extremity edema MS: Normal AROM mjr joints.  No obvious deformity  Abdomen: Somewhat firm.  Moderately distended.  Mildly tender at incisions only.  Right upper quadrant old colostomy wound closed with minimal sanguinous drainage.  No purulence or cellulitis.  No evidence of peritonitis.  No incarcerated hernias.  Ext:   No deformity.  No mjr edema.  No cyanosis Skin: No petechiae / purpurea.  No  major sores.  Warm and dry    Results:   Cultures: Recent Results (from the past 720 hour(s))  MRSA Next Gen by PCR, Nasal     Status: None   Collection Time: 07/03/22  3:16 PM   Specimen: Nasal Mucosa; Nasal Swab  Result Value Ref Range Status   MRSA by PCR Next Gen NOT DETECTED NOT DETECTED Final    Comment: (NOTE) The GeneXpert MRSA Assay (FDA approved for NASAL specimens only), is one component of a comprehensive MRSA colonization surveillance program. It is not intended to diagnose MRSA infection nor to guide or monitor treatment for MRSA infections. Test performance is not FDA approved in patients less than 18 years old. Performed at [facility], [place] [address], [place], [place] [address]     Labs: Results for orders placed or performed during the hospital encounter of 07/01/22 (from the past 48 hour(s))  Basic metabolic panel     Status: Abnormal   Collection Time: 07/03/22 11:23 AM  Result Value Ref Range   Sodium 135 135 - 145 mmol/L   Potassium 3.9 3.5 - 5.1 mmol/L   Chloride 105 98 - 111 mmol/L   CO2 23  22 - 32 mmol/L   Glucose, Bld 100 (H) 70 - 99 mg/dL    Comment: Glucose reference range applies only to samples taken after fasting for at least 8 hours.   BUN 27 (H) 8 - 23 mg/dL   Creatinine, Ser 2.02 (H) 0.61 - 1.24 mg/dL   Calcium 7.9 (L) 8.9 - 10.3 mg/dL   GFR, Estimated 36 (L) >60 mL/min    Comment: (NOTE) Calculated using the CKD-EPI Creatinine Equation (2021)    Anion gap 7 5 - 15    Comment: Performed at [facility], [place] [address] [place], [place] [address]  Blood gas, arterial     Status: Abnormal   Collection Time: 07/03/22  2:21 PM  Result Value Ref Range   Delivery systems NASAL CANNULA     Comment: CORRECTED ON 02/25 AT 1639: PREVIOUSLY REPORTED AS NO CHARGE   pH, Arterial 7.38 7.35 - 7.45   pCO2 arterial 46 32 - 48 mmHg   pO2, Arterial 63 (L) 83 - 108 mmHg   Bicarbonate 27.2 20.0 - 28.0 mmol/L    Acid-Base Excess 1.7 0.0 - 2.0 mmol/L   O2 Saturation 93.7 %   Patient temperature 36.9    Collection site LEFT RADIAL    Drawn by [id number]    Allens test (pass/fail) PASS PASS    Comment: Performed at [facility], [place] [address], [place], [place] [address]  MRSA Next Gen by PCR, Nasal     Status: None   Collection Time: 07/03/22  3:16 PM   Specimen: Nasal Mucosa; Nasal Swab  Result Value Ref Range   MRSA by PCR Next Gen NOT DETECTED NOT DETECTED    Comment: (NOTE) The GeneXpert MRSA Assay (FDA approved for NASAL specimens only), is one component of a comprehensive MRSA colonization surveillance program. It is not intended to diagnose MRSA infection nor to guide or monitor treatment for MRSA infections. Test performance is not FDA approved in patients less than 3 years old. Performed at [facility], [place] [address] [place], [place] [address]   CBC     Status: Abnormal   Collection Time: 07/03/22  3:23 PM  Result Value Ref Range   WBC 19.3 (H) 4.0 - 10.5 K/uL   RBC 3.94 (L) 4.22 - 5.81 MIL/uL   Hemoglobin 10.6 (L) 13.0 - 17.0 g/dL   HCT 31.0 (L) 39.0 - 52.0 %   MCV 78.7 (L) 80.0 - 100.0 fL   MCH 26.9 26.0 - 34.0 pg   MCHC 34.2 30.0 - 36.0 g/dL   RDW 14.6 11.5 - 15.5 %   Platelets 243 150 - 400 K/uL   nRBC 0.0 0.0 - 0.2 %    Comment: Performed at [facility], [place] [address] [place], [place] [id number]  Basic metabolic panel     Status: Abnormal   Collection Time: 07/03/22  3:23 PM  Result Value Ref Range   Sodium 136 135 - 145 mmol/L   Potassium 3.8 3.5 - 5.1 mmol/L   Chloride 103 98 - 111 mmol/L   CO2 23 22 - 32 mmol/L   Glucose, Bld 115 (H) 70 - 99 mg/dL    Comment: Glucose reference range applies only to samples taken after fasting for at least 8 hours.   BUN 27 (H) 8 - 23 mg/dL   Creatinine, Ser 1.95 (H) 0.61 - 1.24 mg/dL   Calcium 8.0 (L) 8.9 - 10.3 mg/dL   GFR, Estimated 37 (L) >60  mL/min    Comment:  (NOTE) Calculated using the CKD-EPI Creatinine Equation (2021)    Anion gap 10 5 - 15    Comment: Performed at [facility], [place] [address] [place], [place] [address]  CBC     Status: Abnormal   Collection Time: 07/04/22  5:00 AM  Result Value Ref Range   WBC 15.7 (H) 4.0 - 10.5 K/uL   RBC 3.64 (L) 4.22 - 5.81 MIL/uL   Hemoglobin 9.5 (L) 13.0 - 17.0 g/dL   HCT 27.6 (L) 39.0 - 52.0 %   MCV 75.8 (L) 80.0 - 100.0 fL   MCH 26.1 26.0 - 34.0 pg   MCHC 34.4 30.0 - 36.0 g/dL   RDW 14.4 11.5 - 15.5 %   Platelets 244 150 - 400 K/uL   nRBC 0.0 0.0 - 0.2 %    Comment: Performed at [facility], [place] [address] [place], [place] [address]  Hemoglobin     Status: Abnormal   Collection Time: 07/05/22  3:15 AM  Result Value Ref Range   Hemoglobin 8.7 (L) 13.0 - 17.0 g/dL    Comment: Performed at [facility], [place] [address], [place], [place] [address]    Imaging / Studies: DG CHEST PORT 1 VIEW  Result Date: 07/05/2022 CLINICAL DATA:  Mucous plugging of bronchi EXAM: PORTABLE CHEST 1 VIEW COMPARISON:  Radiograph 07/04/2022 FINDINGS: Right chest wall Port-A-Cath tip in the SVC. Subdiaphragmatic enteric tube. Stable cardiomediastinal silhouette. The left lung is clear. Improved aeration of the right lower lung. Elevated right hemidiaphragm. Small right pleural effusion. No pneumothorax. IMPRESSION: Improved aeration of the right lower lobe since 07/04/2022. Electronically Signed   By: Placido Sou M.D.   On: 07/05/2022 03:38   DG CHEST PORT 1 VIEW  Result Date: 07/04/2022 CLINICAL DATA:  RIGHT lung collapse. EXAM: PORTABLE CHEST 1 VIEW COMPARISON:  July 03, 2022 FINDINGS: Gastric tube in place tip in the proximal stomach, side port below GE junction. RIGHT-sided Port-A-Cath tip in the area of the lower superior vena cava. Cardiomediastinal contours and hilar structures grossly stable though obscured RIGHT hilum and lower chest due to  elevation of the RIGHT hemidiaphragm. This airspace process is unchanged. LEFT chest is clear.  No visible pneumothorax. Moderate volume pneumoperitoneum is similar to perhaps slightly diminished accounting for differences in technique and was seen on recent imaging in this patient who is had recent colectomy. On limited assessment no acute skeletal findings. IMPRESSION: 1. Similar appearance of RIGHT basilar airspace disease and small amount of pleural fluid. Pneumonia or aspiration remains a concern. 2. Moderate pneumoperitoneum similar to slightly diminished compared to prior imaging in this patient with history of recent abdominal surgery. Correlate with any abdominal symptoms and suggest attention on follow-up. Electronically Signed   By: Zetta Bills M.D.   On: 07/04/2022 12:31   CT CHEST WO CONTRAST  Result Date: 07/04/2022 CLINICAL DATA:  Pneumonia, pleural effusion EXAM: CT CHEST WITHOUT CONTRAST TECHNIQUE: Multidetector CT imaging of the chest was performed following the standard protocol without IV contrast. RADIATION DOSE REDUCTION: This exam was performed according to the departmental dose-optimization program which includes automated exposure control, adjustment of the mA and/or kV according to patient size and/or use of iterative reconstruction technique. COMPARISON:  03/05/2022 FINDINGS: Cardiovascular: Extensive multi-vessel coronary artery calcification. Global cardiac size within normal limits. No pericardial effusion. Mild atherosclerotic calcification within the thoracic aorta. No aortic aneurysm. Right internal jugular chest port tip is seen at the  superior cavoatrial junction. Mediastinum/Nodes: Visualized thyroid is unremarkable. No pathologic thoracic adenopathy. Esophagus unremarkable. Nasogastric tube extends into the proximal body of the stomach. Lungs/Pleura: There is extensive endoluminal debris within the right mainstem bronchus extending into the segmental and subsegmental  bronchi which may reflect changes of aspiration or mucous plugging. There is complete collapse of the right middle and lower lobes. Small right pleural effusion. Bronchial wall thickening noted within the upper lobe in keeping with airway inflammation. Mild left basilar atelectasis. Trace left pleural effusion. No pneumothorax. Upper Abdomen: Moderate free intraperitoneal gas noted within the visualized upper abdomen in keeping with history of recent abdominal surgery. Musculoskeletal: No acute bone abnormality. No lytic or blastic bone lesion. IMPRESSION: 1. Extensive endoluminal debris within the right mainstem bronchus extending into the segmental and subsegmental bronchi which may reflect changes of aspiration or mucous plugging. Complete collapse of the right middle and lower lobes. Small right pleural effusion. 2. Bronchial wall thickening within the right upper lobe in keeping with airway inflammation. 3. Extensive multi-vessel coronary artery calcification. 4. Moderate free intraperitoneal gas in keeping with history of recent abdominal surgery. Aortic Atherosclerosis (ICD10-I70.0). Electronically Signed   By: Fidela Salisbury M.D.   On: 07/04/2022 00:47   DG Abd 1 View  Result Date: 07/03/2022 CLINICAL DATA:  Confirm NG tube placement EXAM: ABDOMEN - 1 VIEW COMPARISON:  Chest radiograph earlier today. FINDINGS: Enteric tube tip and side-port in the stomach. Dilated gas-filled loops of small bowel in the left hemiabdomen. Elevated right hemidiaphragm with associated atelectasis. Free air in the upper abdomen. Probable right pleural effusion. Accessed right chest wall Port-A-Cath tip in the right atrium. IMPRESSION: 1. Enteric tube tip and side-port in the stomach. 2. Gas-filled dilated loops of small bowel in the left hemiabdomen. 3. Pneumoperitoneum. Electronically Signed   By: Placido Sou M.D.   On: 07/03/2022 23:42   DG CHEST PORT 1 VIEW  Result Date: 07/03/2022 CLINICAL DATA:  Shortness of  breath.  Abdominal surgery 2 days ago EXAM: PORTABLE CHEST 1 VIEW COMPARISON:  02/26/2021, 03/05/2022 FINDINGS: Right-sided chest port remains in place. Previously seen tracheostomy tube has been removed. Heart size within normal limits. New elevation of the right hemidiaphragm with dense right basilar opacity. Probable small right pleural effusion. Left lung is clear. No pneumothorax. Large amount of free air is present within the upper abdomen. IMPRESSION: 1. Large amount of free air within the upper abdomen, likely in part related to recent abdominal surgery. Correlation with abdominal exam and follow-up dedicated radiographic abdominal series is recommended. 2. New elevation of the right hemidiaphragm with dense right basilar opacity, likely atelectasis. Probable small right pleural effusion. These results will be called to the ordering clinician or representative by the Radiologist Assistant, and communication documented in the PACS or [employer]. Electronically Signed   By: Davina Poke D.O.   On: 07/03/2022 15:02    Medications / Allergies: per chart  Antibiotics: Anti-infectives (From admission, onward)    Start     Dose/Rate Route Frequency Ordered Stop   07/03/22 1700  piperacillin-tazobactam (ZOSYN) IVPB 3.375 g        3.375 g 12.5 mL/hr over 240 Minutes Intravenous Every 8 hours 07/03/22 1612     07/02/22 0000  cefoTEtan (CEFOTAN) 2 g in sodium chloride 0.9 % 100 mL IVPB        2 g 200 mL/hr over 30 Minutes Intravenous Every 12 hours 07/01/22 1737 07/02/22 0051   07/01/22 1400  neomycin (MYCIFRADIN) tablet 1,000  mg  Status:  Discontinued       See Hyperspace for full Linked Orders Report.   1,000 mg Oral 3 times per day 07/01/22 1054 07/01/22 1109   07/01/22 1400  metroNIDAZOLE (FLAGYL) tablet 1,000 mg  Status:  Discontinued       See Hyperspace for full Linked Orders Report.   1,000 mg Oral 3 times per day 07/01/22 1054 07/01/22 1109   07/01/22 1100  cefoTEtan (CEFOTAN) 2 g  in sodium chloride 0.9 % 100 mL IVPB        2 g 200 mL/hr over 30 Minutes Intravenous On call to O.R. 07/01/22 1054 07/01/22 1204         Note: Portions of this report may have been transcribed using voice recognition software. Every effort was made to ensure accuracy; however, inadvertent computerized transcription errors may be present.   Any transcriptional errors that result from this process are unintentional.    Adin Hector, MD, FACS, MASCRS Esophageal, Gastrointestinal & Colorectal Surgery Robotic and Minimally Invasive Surgery  [facility]. [address] [place], [place] [address] [phone] Fax [phone] Main  CONTACT INFORMATION:  Weekday (9AM-5PM): Call [facility] main office at [phone]  Weeknight (5PM-9AM) or Weekend/Holiday: Check [email or web address] (password " [email or web address]") for General Surgery [facility] coverage  (Please, do not use SecureChat as it is not reliable communication to reach operating surgeons for immediate patient care given surgeries/outpatient duties/clinic/cross-coverage/off post-call which would lead to a delay in care.  Epic staff messaging available for outptient concerns, but may not be answered for 48 hours or more).     07/05/2022  7:57 AM

## 2022-07-06 ENCOUNTER — Inpatient Hospital Stay (HOSPITAL_COMMUNITY): Payer: Medicare PPO

## 2022-07-06 ENCOUNTER — Encounter (HOSPITAL_COMMUNITY): Payer: Self-pay | Admitting: Surgery

## 2022-07-06 DIAGNOSIS — E876 Hypokalemia: Secondary | ICD-10-CM | POA: Insufficient documentation

## 2022-07-06 DIAGNOSIS — N183 Chronic kidney disease, stage 3 unspecified: Secondary | ICD-10-CM | POA: Insufficient documentation

## 2022-07-06 DIAGNOSIS — R410 Disorientation, unspecified: Secondary | ICD-10-CM | POA: Diagnosis not present

## 2022-07-06 LAB — BASIC METABOLIC PANEL
Anion gap: 8 (ref 5–15)
BUN: 18 mg/dL (ref 8–23)
CO2: 27 mmol/L (ref 22–32)
Calcium: 7.9 mg/dL — ABNORMAL LOW (ref 8.9–10.3)
Chloride: 108 mmol/L (ref 98–111)
Creatinine, Ser: 1.47 mg/dL — ABNORMAL HIGH (ref 0.61–1.24)
GFR, Estimated: 53 mL/min — ABNORMAL LOW (ref >60)
Glucose, Bld: 120 mg/dL — ABNORMAL HIGH (ref 70–99)
Potassium: 3.4 mmol/L — ABNORMAL LOW (ref 3.5–5.1)
Sodium: 143 mmol/L (ref 135–145)

## 2022-07-06 LAB — GLUCOSE, CAPILLARY
Glucose-Capillary: 105 mg/dL — ABNORMAL HIGH (ref 70–99)
Glucose-Capillary: 108 mg/dL — ABNORMAL HIGH (ref 70–99)
Glucose-Capillary: 111 mg/dL — ABNORMAL HIGH (ref 70–99)
Glucose-Capillary: 117 mg/dL — ABNORMAL HIGH (ref 70–99)
Glucose-Capillary: 122 mg/dL — ABNORMAL HIGH (ref 70–99)
Glucose-Capillary: 122 mg/dL — ABNORMAL HIGH (ref 70–99)

## 2022-07-06 LAB — CBC
HCT: 25.4 % — ABNORMAL LOW (ref 39.0–52.0)
Hemoglobin: 8.6 g/dL — ABNORMAL LOW (ref 13.0–17.0)
MCH: 26.1 pg (ref 26.0–34.0)
MCHC: 33.9 g/dL (ref 30.0–36.0)
MCV: 77.2 fL — ABNORMAL LOW (ref 80.0–100.0)
Platelets: 285 10*3/uL (ref 150–400)
RBC: 3.29 MIL/uL — ABNORMAL LOW (ref 4.22–5.81)
RDW: 14.3 % (ref 11.5–15.5)
WBC: 12.5 10*3/uL — ABNORMAL HIGH (ref 4.0–10.5)
nRBC: 0 % (ref 0.0–0.2)

## 2022-07-06 LAB — MAGNESIUM: Magnesium: 2.1 mg/dL (ref 1.7–2.4)

## 2022-07-06 LAB — PREALBUMIN: Prealbumin: 12 mg/dL — ABNORMAL LOW (ref 18–38)

## 2022-07-06 MED ORDER — ACETAMINOPHEN 10 MG/ML IV SOLN
1000.0000 mg | Freq: Four times a day (QID) | INTRAVENOUS | Status: AC
  Administered 2022-07-06 – 2022-07-07 (×4): 1000 mg via INTRAVENOUS
  Filled 2022-07-06 (×4): qty 100

## 2022-07-06 MED ORDER — HYDROMORPHONE HCL 1 MG/ML IJ SOLN
0.5000 mg | INTRAMUSCULAR | Status: DC | PRN
  Administered 2022-07-07 (×2): 2 mg via INTRAVENOUS
  Filled 2022-07-06 (×2): qty 2

## 2022-07-06 MED ORDER — POTASSIUM CHLORIDE 10 MEQ/50ML IV SOLN
10.0000 meq | INTRAVENOUS | Status: AC
  Administered 2022-07-06 (×4): 10 meq via INTRAVENOUS
  Filled 2022-07-06 (×4): qty 50

## 2022-07-06 MED ORDER — SODIUM CHLORIDE 0.9 % IV SOLN
250.0000 mg | Freq: Three times a day (TID) | INTRAVENOUS | Status: DC
  Administered 2022-07-06 – 2022-07-08 (×6): 250 mg via INTRAVENOUS
  Filled 2022-07-06 (×10): qty 5

## 2022-07-06 MED ORDER — PANTOPRAZOLE SODIUM 40 MG IV SOLR
40.0000 mg | Freq: Every day | INTRAVENOUS | Status: DC
  Administered 2022-07-06 – 2022-07-08 (×3): 40 mg via INTRAVENOUS
  Filled 2022-07-06 (×3): qty 10

## 2022-07-06 NOTE — Progress Notes (Signed)
Thomas [name] [id number] 1955/09/12  CARE TEAM:  PCP: [name], NP  Outpatient Care Team: Patient Care Team: [name], NP as PCP - General Benito Mccreedy, MD (Internal Medicine) Patient, No Pcp Per (General Practice) Michael Boston, MD as Consulting Physician (Colon and Rectal Surgery) Irene Shipper, MD as Consulting Physician (Gastroenterology) Ladell Pier, MD as Consulting Physician (Oncology)  Inpatient Treatment Team: Treatment Team: Attending Provider: Michael Boston, MD; Rounding Team: Pccm, Md, MD; Registered Nurse: Kathreen Devoid, RN; Occupational Therapist: [name], OT; Registered Nurse: Elie Goody, RN; Pharmacist: Lenis Noon, [facility]; Utilization Review: Antionette Char, RN; Student Nurse: Lewis Shock; Speech Language Pathologist: Narda Rutherford, CCC-SLP   Problem List:   Principal Problem:   History of colorectal cancer Active Problems:   Colon cancer ([place])   Delirium   5 Days Post-Op  07/01/2022  POST-OPERATIVE DIAGNOSIS:   COLOSTOMY FOR COLON RESECTION INCARCERATED INCISIONAL HERNIA   PROCEDURE:   TAKEDOWN OF END COLOSTOMY WITH ANASTOMOSIS INTRAOPERATIVE ASSESSMENT OF TISSUE VASCULAR PERFUSION USING ICG (indocyanine green) IMMUNOFLUORESCENCE TRANSVERSUS ABDOMINIS PLANE (TAP) BLOCK - BILATERAL LYSIS OF ADHESIONS x90 MINUTES (50% OF CASE) PRIMARY REPAIR OF INCARCERATED INCISIONAL HERNIA   SURGEON:  Adin Hector, MD  OR FINDINGS:    Patient had very dense intra-abdominal adhesions of small bowel to all quadrants of the abdomen as well as colon.  He had a loop of small bowel incarcerated in a periumbilical midline incisional hernia 4 x 4 cm.  Reduced and primarily repaired.   No obvious metastatic disease on visceral parietal peritoneum or liver.   It is an isoperistaltic ileocolonic anastomosis (proximal transverse colon to distal transverse colon) that rests in the epigastric region.     Assessment  Probable postop ileus with aspiration pneumonitis.  [name] Stay = 5 days)  Plan: -NG tube and bowel rest.  -d/w PCCM.  To schedule right metoclopramide and add erythromycin to hopefully help restart bowels with his ileus.  NG tube output is low so I think it is safe.  -I would not trust his digestive tract for any pills.  Please do not administer enteral meds p.o./NG.  Keep it to low intermittent wall suction only have a sense of return of bowel function.  Once there is evidence of return of bowel function with bowel movements, consider clamping trial with clear liquids checking residuals every 6 hours.  If that is tolerated then DC NG tube the following day and placed on dysphagia 1 diet.  Parenteral nutrition if not improved by postop day 7.  Hypokalemia.  Correcting.  Still low.  Check magnesium.  Chronic kidney disease but nonoliguric.  Creatinine seems to have stabilized to his baseline in the mid ones.  Follow.  Some mental status changes most likely consistent with ICU psychosis and sleep deprivation.  Seems to be more calm on Haldol at night according to nursing.  Follow.  IV antibiotics per pulmonary critical care.  Current concern for some plugging.  Pulmonary critical care following.  Trying chest physical therapy before considering bronchoscopy.  Pulmonary status seems to be stable.  Discussed with pulmonary critical care.  They recommend stepdown observation 1 more day just in case.  Some acute blood loss anemia in setting of chronic anemia of chronic disease and iron deficiency anemia.  Not hypotensive.  Follow.  -VTE prophylaxis- SCDs, etc  -mobilize as tolerated to help recovery.  Therapy help appreciated.  They tentatively feel home health may be appropriate.  We will see.    Disposition:  Disposition:  The patient is from: Home  Anticipate discharge to:  Home with Home Health  Anticipated Date of Discharge is:  MArch 3,2024    Barriers  to discharge:  Pending Clinical improvement (more likely than not)  Patient currently is NOT MEDICALLY STABLE for discharge from the hospital from a surgery standpoint.   I updated the patient's status to the  patient, [name] nursing just outside room, and critical care team [place] NP.   Recommendations were made.  Questions were answered.  They expressed understanding & appreciation.    I reviewed nursing notes, last 24 h vitals and pain scores, last 48 h intake and output, last 24 h labs and trends, and last 24 h imaging results.  Reviewed notes with respiratory physical therapies as well as critical care nursing.  I have reviewed this patient's available data, including medical history, events of note, test results, etc as part of my evaluation.  A significant portion of that time was spent in counseling.  Care during the described time interval was provided by me.  This care required moderate level of medical decision making.  07/06/2022    Subjective: (Chief complaint)  More confused yesterday.  Started on Haldol nightly.  Started on metoclopramide.  Rested a little better.  More calm today.  Denies any abdominal pain.  NG tube output going down.  Started on metoclopramide without any decline.  Objective:  Vital signs:  Vitals:   07/06/22 0400 07/06/22 0500 07/06/22 0600 07/06/22 0635  BP: (!) 173/66 (!) 166/76 (!) 191/75   Pulse: 93 92 94 89  Resp: '17 14 14 14  '$ Temp:      TempSrc:      SpO2: 91% 98% 94% 96%  Weight:  93 kg    Height:        Last BM Date : 06/30/22  Intake/Output   Yesterday:  02/26 0701 - 02/27 0700 In: 1627.2 [I.V.:940; IV Piggyback:687.3] Out: 2400 [Urine:1850; Emesis/NG output:550] This shift:  No intake/output data recorded.  Bowel function:  Flatus: No  BM:  No  Drain:  Nasogastric tube with lightly bilious tinged effluent.  Rather thin.   Physical Exam:  General: Pt awake/alert in no acute distress.  A little tired but not  toxic. Eyes: PERRL, normal EOM.  Sclera clear.  No icterus Neuro: CN II-XII intact w/o focal sensory/motor deficits. Lymph: No head/neck/groin lymphadenopathy Psych:  No delerium/psychosis/paranoia.  Oriented x 4 HENT: Normocephalic, Mucus membranes moist.  No thrush Neck: Supple, No tracheal deviation.  No obvious thyromegaly Chest: No pain to chest wall compression.  Good respiratory excursion.  No audible wheezing CV:  Pulses intact.  Regular rhythm.  No major extremity edema MS: Normal AROM mjr joints.  No obvious deformity  Abdomen: Soft.  Mildy distended.  Nontender.  Right upper quadrant old colostomy wound closed with minimal sanguinous drainage.  No purulence or cellulitis.  No evidence of peritonitis.  No incarcerated hernias.  Ext:   No deformity.  No mjr edema.  No cyanosis Skin: No petechiae / purpurea.  No major sores.  Warm and dry    Results:   Cultures: Recent Results (from the past 720 hour(s))  MRSA Next Gen by PCR, Nasal     Status: None   Collection Time: 07/03/22  3:16 PM   Specimen: Nasal Mucosa; Nasal Swab  Result Value Ref Range Status   MRSA by PCR Next Gen NOT DETECTED NOT DETECTED Final  Comment: (NOTE) The GeneXpert MRSA Assay (FDA approved for NASAL specimens only), is one component of a comprehensive MRSA colonization surveillance program. It is not intended to diagnose MRSA infection nor to guide or monitor treatment for MRSA infections. Test performance is not FDA approved in patients less than 74 years old. Performed at [facility], [place] [address], [place], [place] [address]     Labs: Results for orders placed or performed during the hospital encounter of 07/01/22 (from the past 48 hour(s))  Hemoglobin     Status: Abnormal   Collection Time: 07/05/22  3:15 AM  Result Value Ref Range   Hemoglobin 8.7 (L) 13.0 - 17.0 g/dL    Comment: Performed at [facility], [place] [address], [place], [place]  [address]  Glucose, capillary     Status: None   Collection Time: 07/05/22  9:09 AM  Result Value Ref Range   Glucose-Capillary 72 70 - 99 mg/dL    Comment: Glucose reference range applies only to samples taken after fasting for at least 8 hours.  Glucose, capillary     Status: None   Collection Time: 07/05/22 11:14 AM  Result Value Ref Range   Glucose-Capillary 70 70 - 99 mg/dL    Comment: Glucose reference range applies only to samples taken after fasting for at least 8 hours.  Glucose, capillary     Status: None   Collection Time: 07/05/22  3:49 PM  Result Value Ref Range   Glucose-Capillary 79 70 - 99 mg/dL    Comment: Glucose reference range applies only to samples taken after fasting for at least 8 hours.  Basic metabolic panel     Status: Abnormal   Collection Time: 07/05/22  6:07 PM  Result Value Ref Range   Sodium 143 135 - 145 mmol/L    Comment: DELTA CHECK NOTED   Potassium 2.9 (L) 3.5 - 5.1 mmol/L   Chloride 111 98 - 111 mmol/L   CO2 26 22 - 32 mmol/L   Glucose, Bld 115 (H) 70 - 99 mg/dL    Comment: Glucose reference range applies only to samples taken after fasting for at least 8 hours.   BUN 21 8 - 23 mg/dL   Creatinine, Ser 1.47 (H) 0.61 - 1.24 mg/dL   Calcium 7.6 (L) 8.9 - 10.3 mg/dL   GFR, Estimated 53 (L) >60 mL/min    Comment: (NOTE) Calculated using the CKD-EPI Creatinine Equation (2021)    Anion gap 6 5 - 15    Comment: Performed at [facility], [place] [address], [place], [place] [address]  Magnesium     Status: None   Collection Time: 07/05/22  6:07 PM  Result Value Ref Range   Magnesium 2.2 1.7 - 2.4 mg/dL    Comment: Performed at [facility], [place] [address] [place], [place] [address]  CBC     Status: Abnormal   Collection Time: 07/05/22  6:08 PM  Result Value Ref Range   WBC 13.4 (H) 4.0 - 10.5 K/uL   RBC 3.28 (L) 4.22 - 5.81 MIL/uL   Hemoglobin 8.6 (L) 13.0 - 17.0 g/dL    Comment: Reticulocyte Hemoglobin  testing may be clinically indicated, consider ordering this additional test [id number]    HCT 25.3 (L) 39.0 - 52.0 %   MCV 77.1 (L) 80.0 - 100.0 fL   MCH 26.2 26.0 - 34.0 pg   MCHC 34.0 30.0 - 36.0 g/dL   RDW 14.3 11.5 - 15.5 %  Platelets 276 150 - 400 K/uL   nRBC 0.0 0.0 - 0.2 %    Comment: Performed at [facility], [place] [address], [place], [place] [address]  Glucose, capillary     Status: None   Collection Time: 07/05/22  7:57 PM  Result Value Ref Range   Glucose-Capillary 97 70 - 99 mg/dL    Comment: Glucose reference range applies only to samples taken after fasting for at least 8 hours.   Comment 1 Notify RN    Comment 2 Document in Chart   Glucose, capillary     Status: Abnormal   Collection Time: 07/05/22 11:19 PM  Result Value Ref Range   Glucose-Capillary 108 (H) 70 - 99 mg/dL    Comment: Glucose reference range applies only to samples taken after fasting for at least 8 hours.   Comment 1 Notify RN    Comment 2 Document in Chart   Glucose, capillary     Status: Abnormal   Collection Time: 07/06/22  3:42 AM  Result Value Ref Range   Glucose-Capillary 122 (H) 70 - 99 mg/dL    Comment: Glucose reference range applies only to samples taken after fasting for at least 8 hours.   Comment 1 Notify RN    Comment 2 Document in Chart   Basic metabolic panel     Status: Abnormal   Collection Time: 07/06/22  6:22 AM  Result Value Ref Range   Sodium 143 135 - 145 mmol/L   Potassium 3.4 (L) 3.5 - 5.1 mmol/L   Chloride 108 98 - 111 mmol/L   CO2 27 22 - 32 mmol/L   Glucose, Bld 120 (H) 70 - 99 mg/dL    Comment: Glucose reference range applies only to samples taken after fasting for at least 8 hours.   BUN 18 8 - 23 mg/dL   Creatinine, Ser 1.47 (H) 0.61 - 1.24 mg/dL   Calcium 7.9 (L) 8.9 - 10.3 mg/dL   GFR, Estimated 53 (L) >60 mL/min    Comment: (NOTE) Calculated using the CKD-EPI Creatinine Equation (2021)    Anion gap 8 5 - 15    Comment: Performed at  [facility], [place] [address], [place], [place] [address]  CBC     Status: Abnormal   Collection Time: 07/06/22  6:22 AM  Result Value Ref Range   WBC 12.5 (H) 4.0 - 10.5 K/uL   RBC 3.29 (L) 4.22 - 5.81 MIL/uL   Hemoglobin 8.6 (L) 13.0 - 17.0 g/dL    Comment: Reticulocyte Hemoglobin testing may be clinically indicated, consider ordering this additional test [id number]    HCT 25.4 (L) 39.0 - 52.0 %   MCV 77.2 (L) 80.0 - 100.0 fL   MCH 26.1 26.0 - 34.0 pg   MCHC 33.9 30.0 - 36.0 g/dL   RDW 14.3 11.5 - 15.5 %   Platelets 285 150 - 400 K/uL   nRBC 0.0 0.0 - 0.2 %    Comment: Performed at [facility], [place] [address] [place], [place] [address]  Glucose, capillary     Status: Abnormal   Collection Time: 07/06/22  7:54 AM  Result Value Ref Range   Glucose-Capillary 117 (H) 70 - 99 mg/dL    Comment: Glucose reference range applies only to samples taken after fasting for at least 8 hours.    Imaging / Studies: DG CHEST PORT 1 VIEW  Result Date: 07/05/2022 CLINICAL DATA:  Mucous plugging of bronchi EXAM: PORTABLE CHEST 1  VIEW COMPARISON:  Radiograph 07/04/2022 FINDINGS: Right chest wall Port-A-Cath tip in the SVC. Subdiaphragmatic enteric tube. Stable cardiomediastinal silhouette. The left lung is clear. Improved aeration of the right lower lung. Elevated right hemidiaphragm. Small right pleural effusion. No pneumothorax. IMPRESSION: Improved aeration of the right lower lobe since 07/04/2022. Electronically Signed   By: Placido Sou M.D.   On: 07/05/2022 03:38   DG CHEST PORT 1 VIEW  Result Date: 07/04/2022 CLINICAL DATA:  RIGHT lung collapse. EXAM: PORTABLE CHEST 1 VIEW COMPARISON:  July 03, 2022 FINDINGS: Gastric tube in place tip in the proximal stomach, side port below GE junction. RIGHT-sided Port-A-Cath tip in the area of the lower superior vena cava. Cardiomediastinal contours and hilar structures grossly stable though obscured RIGHT hilum and  lower chest due to elevation of the RIGHT hemidiaphragm. This airspace process is unchanged. LEFT chest is clear.  No visible pneumothorax. Moderate volume pneumoperitoneum is similar to perhaps slightly diminished accounting for differences in technique and was seen on recent imaging in this patient who is had recent colectomy. On limited assessment no acute skeletal findings. IMPRESSION: 1. Similar appearance of RIGHT basilar airspace disease and small amount of pleural fluid. Pneumonia or aspiration remains a concern. 2. Moderate pneumoperitoneum similar to slightly diminished compared to prior imaging in this patient with history of recent abdominal surgery. Correlate with any abdominal symptoms and suggest attention on follow-up. Electronically Signed   By: Zetta Bills M.D.   On: 07/04/2022 12:31    Medications / Allergies: per chart  Antibiotics: Anti-infectives (From admission, onward)    Start     Dose/Rate Route Frequency Ordered Stop   07/03/22 1700  piperacillin-tazobactam (ZOSYN) IVPB 3.375 g        3.375 g 12.5 mL/hr over 240 Minutes Intravenous Every 8 hours 07/03/22 1612     07/02/22 0000  cefoTEtan (CEFOTAN) 2 g in sodium chloride 0.9 % 100 mL IVPB        2 g 200 mL/hr over 30 Minutes Intravenous Every 12 hours 07/01/22 1737 07/02/22 0051   07/01/22 1400  neomycin (MYCIFRADIN) tablet 1,000 mg  Status:  Discontinued       See Hyperspace for full Linked Orders Report.   1,000 mg Oral 3 times per day 07/01/22 1054 07/01/22 1109   07/01/22 1400  metroNIDAZOLE (FLAGYL) tablet 1,000 mg  Status:  Discontinued       See Hyperspace for full Linked Orders Report.   1,000 mg Oral 3 times per day 07/01/22 1054 07/01/22 1109   07/01/22 1100  cefoTEtan (CEFOTAN) 2 g in sodium chloride 0.9 % 100 mL IVPB        2 g 200 mL/hr over 30 Minutes Intravenous On call to O.R. 07/01/22 1054 07/01/22 1204         Note: Portions of this report may have been transcribed using voice recognition  software. Every effort was made to ensure accuracy; however, inadvertent computerized transcription errors may be present.   Any transcriptional errors that result from this process are unintentional.    Adin Hector, MD, FACS, MASCRS Esophageal, Gastrointestinal & Colorectal Surgery Robotic and Minimally Invasive Surgery  [facility] [place]. [address] [place], [place] [address] [phone] Fax [phone] Main  CONTACT INFORMATION:  Weekday (9AM-5PM): Call [facility] main office at [phone]  Weeknight (5PM-9AM) or Weekend/Holiday: Check [email or web address] (password " [email or web address]") for General Surgery [facility] coverage  (Please, do not use SecureChat as it is  not reliable communication to reach operating surgeons for immediate patient care given surgeries/outpatient duties/clinic/cross-coverage/off post-call which would lead to a delay in care.  Epic staff messaging available for outptient concerns, but may not be answered for 48 hours or more).     07/06/2022  8:05 AM

## 2022-07-06 NOTE — Progress Notes (Signed)
NAME:  Thomas [name], MRN:  [id number], DOB:  Sep 29, 1955, LOS: 5 ADMISSION DATE:  07/01/2022, CONSULTATION DATE: July 04, 2022 REFERRING MD: Ninfa Linden, CHIEF COMPLAINT: Shortness of breath  History of Present Illness:  67 year old male with a complex surgical history was admitted for an elective robotic ostomy takedown with lysis of adhesions on February 22, postoperatively noted to have right lower lobe collapse and hypoxemia.  Moved to stepdown on February 24 for management of the same.  Pulmonary critical care medicine consulted on February 25 for mucous plugging/right lower lobe collapse and hypoxemia. He tells me that after his diet was advanced on 2/23 he choked on food a few times (both solids and liquids). He doesn't feel short of breath, isn't coughing much.   Pertinent  Medical History  Colon cancer diagnosed in 2022, treated with transverse colectomy which was complicated and required prolonged ICU stay involving temporary hemodialysis, tracheostomy placement. 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 Developed delirium added HS haldol and added back ativan    Interim History / Subjective:  Resting comfortably  Didn't sleep much last night  Objective   Blood pressure (Abnormal) 149/57, pulse 76, temperature (Abnormal) 96.8 F (36 C), temperature source Oral, resp.  rate 14, height '5\' 8"'$  (1.727 m), weight 93 kg, SpO2 98 %.        Intake/Output Summary (Last 24 hours) at 07/06/2022 0821 Last data filed at 07/06/2022 0604 Gross per 24 hour  Intake 1627.24 ml  Output 2300 ml  Net -672.76 ml   Filed Weights   07/04/22 0500 07/05/22 0500 07/06/22 0500  Weight: 87.6 kg 86.5 kg 93 kg    Examination:  General 67 year old male resting in bed. No distress HENT NCAT his MM are dry and cracked Pulm dec bases.  Card systolic HM c/w AS Abd hypoactive NGT in place. Still has occ hiccups  Ext warm and dry  Neuro awake oriented today. Seems a little less confused.    Resolved Hospital Problem list     Assessment & Plan:  Hypoxemia due to right lower lobe collapse Mucous plugging right lower lobe Dyphagia, aspiration event on 2/23 CXR much improved Plan Keep NPO Cont IS and flutter Cont HT saline neb Cont NGT Get him OOB Wean oxygen  Mobilize  Day 4 of 5 zosyn   Acute on chronic kidney disease CKD stage IIIb Scr improving Plan Cont D5LR at 50 cc/hr  Am chem Replace K   Status post lysis of adhesions colostomy takedown Plan NG per primary Wound care/pain meds/nutrition per primary Aggressive K replacement given post-op ileus   History of hypertension and h/o heart murmur  Plan Hydralazline/metoprolol prn SBP > 160 Resume orals when OK w/ surgical team   H/o DM Plan Very Sens scale SSI  Delirium. Prob sleep related/ICU related Plan Cont Haldol at HS for now OOB Mobilize Re-orient Resumed home ativan  History of stroke, left MCA 2015 Aspiration risk Seizure disorder Plan  Keppra IV  See above re: aspiration  Best Practice (right click and "Reselect all SmartList Selections" daily)   Per Primary   Critical care time: [place] ACNP-BC [place] Pager # [phone] OR # [phone] if no answer

## 2022-07-06 NOTE — Evaluation (Signed)
Occupational Therapy Evaluation Patient Details Name: Thomas [name] MRN: [id number] DOB: 11-22-1955 Today's Date: 07/06/2022   History of Present Illness Patient is a 67 yo male admitted 07/01/22  S/P LYSIS OF ADHESIONS ,  PRIMARY REPAIR OF INCARCERATED INCISIONAL HERNIA , TAKEDOWN OF END COLOSTOMY WITH ANASTOMOSIS.2/24 moved to stepdown unit for hypoxemia, right lower lobe collapse CT chest showed debris in right lower lobe with atelectasis PMH, colon and rectal  cancer, colostomy, VDRF with trach 2022,   DVT, afib, DM, HTN, seizure, CVA, fromtal meningioma, S./P lobectomy.   Clinical Impression   Patient is a 67 year old male who was admitted for above. Patient was living at home independently with family prior level. Currently, patient is limited by increased pain in abdomen with movements, decreased strength and functional activity tolerance. Patient  has unclear amount of support at home physically with increased A needed for bed mobility, transfers and ADL tasks. Patient would continue to benefit from skilled OT services at this time while admitted and after d/c to address noted deficits in order to improve overall safety and independence in ADLs.       Recommendations for follow up therapy are one component of a multi-disciplinary discharge planning process, led by the attending physician.  Recommendations may be updated based on patient status, additional functional criteria and insurance authorization.   Follow Up Recommendations  Skilled nursing-short term rehab (<3 hours/day) v.s. [place] pending family level of support.    Assistance Recommended at Discharge Frequent or constant Supervision/Assistance  Patient can return home with the following A lot of help with bathing/dressing/bathroom;Assistance with cooking/housework;Direct supervision/assist for medications management;Assist for transportation;Direct supervision/assist for financial management;Help with stairs or ramp for entrance;A  lot of help with walking and/or transfers    Functional Status Assessment  Patient has had a recent decline in their functional status and demonstrates the ability to make significant improvements in function in a reasonable and predictable amount of time.  Equipment Recommendations  BSC/3in1       Precautions / Restrictions Precautions Precautions: Fall Precaution Comments: NG suction Restrictions Weight Bearing Restrictions: No      Mobility Bed Mobility Overal bed mobility: Needs Assistance   Rolling: Min assist Sidelying to sit: Mod assist       General bed mobility comments: extra time, assiting for NG tube safety        Balance Overall balance assessment: Needs assistance Sitting-balance support: Bilateral upper extremity supported, Feet supported Sitting balance-Leahy Scale: Fair     Standing balance support: Bilateral upper extremity supported, Reliant on assistive device for balance, During functional activity Standing balance-Leahy Scale: Fair           ADL either performed or assessed with clinical judgement   ADL Overall ADL's : Needs assistance/impaired Eating/Feeding: NPO   Grooming: Wash/dry face;Set up;Sitting Grooming Details (indicate cue type and reason): with cues to avoid NG tube Upper Body Bathing: Minimal assistance;Sitting   Lower Body Bathing: Maximal assistance;Sitting/lateral leans   Upper Body Dressing : Minimal assistance;Sitting   Lower Body Dressing: Maximal assistance;Sitting/lateral leans   Toilet Transfer: Minimal assistance;+2 for safety/equipment Toilet Transfer Details (indicate cue type and reason): with HHA with increased person assist with lines. Toileting- Clothing Manipulation and Hygiene: Maximal assistance;Sit to/from stand               Vision   Vision Assessment?: No apparent visual deficits            Pertinent Vitals/Pain Pain Assessment Pain Assessment: Faces  Faces Pain Scale: Hurts even  more Pain Location: abbdomen Pain Descriptors / Indicators: Discomfort Pain Intervention(s): Limited activity within patient's tolerance, Monitored during session     Hand Dominance Right   Extremity/Trunk Assessment Upper Extremity Assessment Upper Extremity Assessment: Overall WFL for tasks assessed   Lower Extremity Assessment Lower Extremity Assessment: Defer to PT evaluation   Cervical / Trunk Assessment Cervical / Trunk Assessment: Normal;Other exceptions Cervical / Trunk Exceptions: abdominal surgery   Communication Communication Communication: No difficulties   Cognition Arousal/Alertness: Awake/alert Behavior During Therapy: WFL for tasks assessed/performed Overall Cognitive Status: Within Functional Limits for tasks assessed                        Home Living Family/patient expects to be discharged to:: Private residence Living Arrangements: Spouse/significant other;Children Available Help at Discharge: Family;Available PRN/intermittently Type of Home: House Home Access: Stairs to enter [employer] of Steps: 4 Entrance Stairs-Rails: Can reach both Home Layout: Multi-level;Able to live on main level with bedroom/bathroom     Bathroom Shower/Tub: Tub/shower unit         Home Equipment: Conservation officer, nature (2 wheels);Cane - single point;Wheelchair - manual          Prior Functioning/Environment Prior Level of Function : Independent/Modified Independent              OT Problem List: Decreased strength;Decreased activity tolerance;Impaired balance (sitting and/or standing);Decreased coordination;Decreased safety awareness;Decreased knowledge of precautions;Pain;Decreased knowledge of use of DME or AE      OT Treatment/Interventions: Energy conservation;Self-care/ADL training;Therapeutic exercise;DME and/or AE instruction;Therapeutic activities;Balance training;Patient/family education    OT Goals(Current goals can be found in the care  plan section) Acute Rehab OT Goals Patient Stated Goal: to get home OT Goal Formulation: With patient Time For Goal Achievement: 07/20/22 Potential to Achieve Goals: Fair  OT Frequency: Min 2X/week       AM-PAC OT "6 Clicks" Daily Activity     Outcome Measure Help from another person eating meals?: Total (NPO) Help from another person taking care of personal grooming?: A Little Help from another person toileting, which includes using toliet, bedpan, or urinal?: A Lot Help from another person bathing (including washing, rinsing, drying)?: A Lot Help from another person to put on and taking off regular upper body clothing?: A Little Help from another person to put on and taking off regular lower body clothing?: A Lot 6 Click Score: 13   End of Session Nurse Communication: Mobility status  Activity Tolerance: Patient limited by pain Patient left: in chair;with call bell/phone within reach;with nursing/sitter in room (nurse)  OT Visit Diagnosis: Unsteadiness on feet (R26.81);Other abnormalities of gait and mobility (R26.89);Muscle weakness (generalized) (M62.81);Pain                Time: [id number] OT Time Calculation (min): 20 min Charges:  OT General Charges $OT Visit: 1 Visit OT Evaluation $OT Eval Moderate Complexity: 1 Mod  Jethro Radke OTR/L, MS Acute Rehabilitation Department Office# [phone]   [name] 07/06/2022, 3:27 PM

## 2022-07-06 NOTE — Evaluation (Signed)
SLP Cancellation Note  Patient Details Name: Thomas [name] MRN: [id number] DOB: 06-19-1955   Cancelled treatment:       Reason Eval/Treat Not Completed: Other (comment) (pt remains in NG to suction and still needs to be NPO per RN, will continue effort)  [name], MS [facility] SLP Acute Rehab Services Office [phone]   [name] 07/06/2022, 11:48 AM

## 2022-07-07 DIAGNOSIS — R410 Disorientation, unspecified: Secondary | ICD-10-CM | POA: Diagnosis not present

## 2022-07-07 DIAGNOSIS — G4701 Insomnia due to medical condition: Secondary | ICD-10-CM | POA: Diagnosis not present

## 2022-07-07 LAB — HEPATIC FUNCTION PANEL
ALT: 19 U/L (ref 0–44)
AST: 22 U/L (ref 15–41)
Albumin: 2.5 g/dL — ABNORMAL LOW (ref 3.5–5.0)
Alkaline Phosphatase: 54 U/L (ref 38–126)
Bilirubin, Direct: 0.2 mg/dL (ref 0.0–0.2)
Indirect Bilirubin: 0.7 mg/dL (ref 0.3–0.9)
Total Bilirubin: 0.9 mg/dL (ref 0.3–1.2)
Total Protein: 5.8 g/dL — ABNORMAL LOW (ref 6.5–8.1)

## 2022-07-07 LAB — CBC
HCT: 25.4 % — ABNORMAL LOW (ref 39.0–52.0)
Hemoglobin: 8.6 g/dL — ABNORMAL LOW (ref 13.0–17.0)
MCH: 26.3 pg (ref 26.0–34.0)
MCHC: 33.9 g/dL (ref 30.0–36.0)
MCV: 77.7 fL — ABNORMAL LOW (ref 80.0–100.0)
Platelets: 309 10*3/uL (ref 150–400)
RBC: 3.27 MIL/uL — ABNORMAL LOW (ref 4.22–5.81)
RDW: 14.8 % (ref 11.5–15.5)
WBC: 13.9 10*3/uL — ABNORMAL HIGH (ref 4.0–10.5)
nRBC: 0 % (ref 0.0–0.2)

## 2022-07-07 LAB — GLUCOSE, CAPILLARY
Glucose-Capillary: 114 mg/dL — ABNORMAL HIGH (ref 70–99)
Glucose-Capillary: 110 mg/dL — ABNORMAL HIGH (ref 70–99)
Glucose-Capillary: 116 mg/dL — ABNORMAL HIGH (ref 70–99)
Glucose-Capillary: 117 mg/dL — ABNORMAL HIGH (ref 70–99)
Glucose-Capillary: 96 mg/dL (ref 70–99)

## 2022-07-07 LAB — AMMONIA: Ammonia: 17 umol/L (ref 9–35)

## 2022-07-07 LAB — POTASSIUM: Potassium: 3.5 mmol/L (ref 3.5–5.1)

## 2022-07-07 LAB — CREATININE, SERUM
Creatinine, Ser: 1.56 mg/dL — ABNORMAL HIGH (ref 0.61–1.24)
GFR, Estimated: 49 mL/min — ABNORMAL LOW (ref >60)

## 2022-07-07 MED ORDER — POTASSIUM CHLORIDE 10 MEQ/50ML IV SOLN
10.0000 meq | INTRAVENOUS | Status: AC
  Administered 2022-07-07 (×4): 10 meq via INTRAVENOUS
  Filled 2022-07-07 (×4): qty 50

## 2022-07-07 MED ORDER — HYDROMORPHONE HCL 1 MG/ML IJ SOLN
2.0000 mg | Freq: Once | INTRAMUSCULAR | Status: AC
  Administered 2022-07-07: 2 mg via INTRAVENOUS
  Filled 2022-07-07: qty 2

## 2022-07-07 MED ORDER — ACETAMINOPHEN 10 MG/ML IV SOLN
1000.0000 mg | Freq: Four times a day (QID) | INTRAVENOUS | Status: AC
  Administered 2022-07-07 – 2022-07-08 (×4): 1000 mg via INTRAVENOUS
  Filled 2022-07-07 (×4): qty 100

## 2022-07-07 MED ORDER — METHOCARBAMOL 1000 MG/10ML IJ SOLN
500.0000 mg | Freq: Four times a day (QID) | INTRAVENOUS | Status: DC | PRN
  Administered 2022-07-07 – 2022-07-11 (×4): 500 mg via INTRAVENOUS
  Filled 2022-07-07 (×3): qty 500
  Filled 2022-07-07 (×2): qty 5
  Filled 2022-07-07: qty 500

## 2022-07-07 MED ORDER — FENTANYL CITRATE PF 50 MCG/ML IJ SOSY
12.5000 ug | PREFILLED_SYRINGE | INTRAMUSCULAR | Status: DC | PRN
  Administered 2022-07-07 – 2022-07-23 (×34): 25 ug via INTRAVENOUS
  Filled 2022-07-07 (×34): qty 1

## 2022-07-07 NOTE — Evaluation (Signed)
SLP Cancellation Note  Patient Details Name: Thomas [name] MRN: [id number] DOB: 1955/05/29   Cancelled treatment:       Reason Eval/Treat Not Completed: Other (comment) (pt still having difficulties with nausea/discomfort per RN, agree with Dr Johney Maine that the swallow will not be normal with NG in place, will continue efforts)  [name], MS [facility] SLP Acute Rehab Services Office [phone]   [name] 07/07/2022, 1:02 PM

## 2022-07-07 NOTE — Progress Notes (Signed)
Physical Therapy Treatment Patient Details Name: Thomas [name] MRN: [id number] DOB: 09-17-1955 Today's Date: 07/07/2022   History of Present Illness Patient is a 67 yo male admitted 07/01/22  S/P LYSIS OF ADHESIONS ,  PRIMARY REPAIR OF INCARCERATED INCISIONAL HERNIA , TAKEDOWN OF END COLOSTOMY WITH ANASTOMOSIS.2/24 moved to stepdown unit for hypoxemia, right lower lobe collapse CT chest showed debris in right lower lobe with atelectasis PMH, colon and rectal  cancer, colostomy, VDRF with trach 2022,   DVT, afib, DM, HTN, seizure, CVA, fromtal meningioma, S./P lobectomy.    PT Comments    The patient is anxious today, asking  for his wife. RN came in to calm patient.  Patient  able to mobilized to sitting with min assistance , ambulated x 20' with RW.  Patient does present with a change in his  MS and function  from previous encounters. Continue PT and assess  needs at DC if different than on Evaluation.  Recommendations for follow up therapy are one component of a multi-disciplinary discharge planning process, led by the attending physician.  Recommendations may be updated based on patient status, additional functional criteria and insurance authorization.  Follow Up Recommendations  Home health PT     Assistance Recommended at Discharge Set up Supervision/Assistance  Patient can return home with the following Assist for transportation;Assistance with cooking/housework;Help with stairs or ramp for entrance;A little help with walking and/or transfers   Equipment Recommendations  None recommended by PT    Recommendations for Other Services       Precautions / Restrictions Precautions Precaution Comments: NG suction     Mobility  Bed Mobility Overal bed mobility: Needs Assistance Bed Mobility: Rolling, Sidelying to Sit, Sit to Sidelying Rolling: Min guard Sidelying to sit: Min assist     Sit to sidelying: Min assist General bed mobility comments: cues to log roll, push up onto  right arm,  to return to bed:to go to left side and then place legs onto bed.    Transfers Overall transfer level: Needs assistance Equipment used: Rolling walker (2 wheels) Transfers: Sit to/from Stand Sit to Stand: Min guard           General transfer comment: cues for safety, lines. Able to rise to stand    Ambulation/Gait Ambulation/Gait assistance: Min assist Gait Distance (Feet): 20 Feet Assistive device: Rolling walker (2 wheels) Gait Pattern/deviations: Step-through pattern Gait velocity: decr     General Gait Details: cues for safety/lines. Gait steady.   Stairs             Wheelchair Mobility    Modified Rankin (Stroke Patients Only)       Balance   Sitting-balance support: Bilateral upper extremity supported, Feet supported Sitting balance-Leahy Scale: Fair     Standing balance support: Bilateral upper extremity supported, Reliant on assistive device for balance, During functional activity Standing balance-Leahy Scale: Fair                              Cognition Arousal/Alertness: Awake/alert Behavior During Therapy: Anxious Overall Cognitive Status: Impaired/Different from baseline                                 General Comments: patient anxious, stating that he cannot do anything since his wife is not here. Patient stated that he did not know what was going on, asking for someone to  tell him  what to do, RN summoned to room to alleviate anxiety        Exercises      General Comments        Pertinent Vitals/Pain Pain Assessment Faces Pain Scale: Hurts even more Pain Location: abbdomen Pain Descriptors / Indicators: Discomfort Pain Intervention(s): Monitored during session    Home Living                          Prior Function            PT Goals (current goals can now be found in the care plan section) Progress towards PT goals: Progressing toward goals    Frequency    Min  3X/week      PT Plan Current plan remains appropriate    Co-evaluation              AM-PAC PT "6 Clicks" Mobility   Outcome Measure  Help needed turning from your back to your side while in a flat bed without using bedrails?: A Little Help needed moving from lying on your back to sitting on the side of a flat bed without using bedrails?: A Little Help needed moving to and from a bed to a chair (including a wheelchair)?: A Little Help needed standing up from a chair using your arms (e.g., wheelchair or bedside chair)?: A Little Help needed to walk in hospital room?: A Lot Help needed climbing 3-5 steps with a railing? : A Lot 6 Click Score: 16    End of Session Equipment Utilized During Treatment: Gait belt Activity Tolerance: Patient limited by fatigue Patient left: in bed;with call bell/phone within reach;with bed alarm set Nurse Communication: Mobility status PT Visit Diagnosis: Unsteadiness on feet (R26.81);Difficulty in walking, not elsewhere classified (R26.2);Pain     Time: 0935-1000 PT Time Calculation (min) (ACUTE ONLY): 25 min  Charges:  $Gait Training: 8-22 mins $Therapeutic Activity: 8-22 mins                     [place] Office [phone] Weekend [id number]    Claretha Cooper 07/07/2022, 12:57 PM

## 2022-07-07 NOTE — Progress Notes (Signed)
KASI DAIN UG:[id number] 28-Jan-1956  CARE TEAM:  PCP: [name], NP  Outpatient Care Team: Patient Care Team: [name], NP as PCP - General Benito Mccreedy, MD (Internal Medicine) Patient, No Pcp Per (General Practice) Michael Boston, MD as Consulting Physician (Colon and Rectal Surgery) Irene Shipper, MD as Consulting Physician (Gastroenterology) Ladell Pier, MD as Consulting Physician (Oncology)  Inpatient Treatment Team: Treatment Team: Attending Provider: Michael Boston, MD; Rounding Team: Pccm, Md, MD; Charge Nurse: Kathy Breach, RN; Registered Nurse: Kathreen Devoid, RN; Physical Therapist: Fuller Mandril, PT; Charge Nurse: Justice Britain, RN; Pharmacist: Lenis Noon, [facility]; Registered Nurse: Elie Goody, RN; Speech Language Pathologist: Fritz Pickerel; Physical Therapist: Adair Patter, PT; Utilization Review: Antionette Char, RN   Problem List:   Principal Problem:   History of colorectal cancer Active Problems:   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   History of ischemic left MCA stroke 2015   Type 2 diabetes mellitus with diabetic nephropathy, without long-term current use of insulin ([place])   Hypertension   Colon cancer ([place])   Epilepsy ([place])   Hyperlipidemia   Gastroesophageal reflux disease without esophagitis   Delirium   Hypokalemia   CKD (chronic kidney disease) stage 3, GFR 30-59 ml/min ([place])   6 Days Post-Op  07/01/2022  POST-OPERATIVE DIAGNOSIS:   COLOSTOMY FOR COLON RESECTION INCARCERATED INCISIONAL HERNIA   PROCEDURE:   TAKEDOWN OF END COLOSTOMY WITH ANASTOMOSIS INTRAOPERATIVE ASSESSMENT OF TISSUE VASCULAR PERFUSION USING ICG (indocyanine green) IMMUNOFLUORESCENCE TRANSVERSUS ABDOMINIS PLANE (TAP) BLOCK - BILATERAL LYSIS OF ADHESIONS x90 MINUTES (50% OF CASE) PRIMARY REPAIR OF INCARCERATED INCISIONAL HERNIA   SURGEON:  Adin Hector, MD  OR FINDINGS:     Patient had very dense intra-abdominal adhesions of small bowel to all quadrants of the abdomen as well as colon.  He had a loop of small bowel incarcerated in a periumbilical midline incisional hernia 4 x 4 cm.  Reduced and primarily repaired.   No obvious metastatic disease on visceral parietal peritoneum or liver.   It is an isoperistaltic ileocolonic anastomosis (proximal transverse colon to distal transverse colon) that rests in the epigastric region.    Assessment  Probable postop ileus with aspiration pneumonitis.  [facility] Stay = 6 days)  Plan: -NG tube and bowel rest.  Abdomen seems much softer with some flatus and a small bowel smear and lower NG tube output.  I suspect he is close to being resolving.  Continue scheduled metoclopramide and erythromycin for the next 48 hours in hopes that helps ileus turnaround faster.   I think we will try NG tube clamping trials with liquids low volume and see if he can tolerate.   If he can tolerate today with low volume and has evidence of bowel function, try to remove NG tube the following day.    If he has worsening abdominal pain or high residuals then keep on suction.    Low threshold to get speech therapy involved again to see what they think.  Unfortunately will not be normal with the NG tube in place and his mental status down.  Some delirium.  May be withdrawal from his regular use of lorazepam and gabapentin.  Stepdown setting.  Defer to pulmonary critical care.  I do not know if Haldol and Ativan are great combination but we will have to see.  Done home and in the drawing either.  Seems to be sundowning.  Check ammonia and LFTs  since he had had that elevated in the past.  -I would not trust his digestive tract for any pills till he is tolerating liquids and NG tube is out.  Parenteral nutrition if not improved by postop day 7.  If worsening abdominal pain or worsening leukocytosis, CT scan to rule out undrained fluid  collection.  Hypokalemia.  Correcting.  Still low.  Magnesium okay   Chronic kidney disease but nonoliguric.  Creatinine seems to have stabilized to his baseline in the mid ones.  Follow.  I do not see reason to keep his Foley catheter since he has a risk of UTI.  Removed with condom cath as needed and I&O as needed.  Has urinary retention despite 1 I&O, Place Foley on second I&O attempt.  IV antibiotics per pulmonary critical care.  Current concern for some plugging.  Pulmonary critical care following.  Trying chest physical therapy before considering bronchoscopy.  Pulmonary status seems to be stable.  Discussed with pulmonary critical care.  They recommend stepdown observation 1 more day just in case.  Some acute blood loss anemia in setting of chronic anemia of chronic disease and iron deficiency anemia.  Not hypotensive.  Follow.  -VTE prophylaxis- SCDs, etc  -mobilize as tolerated to help recovery.  Patient's sundowning he seems less motivated.  Nursing tried to get him up in the bed.  Hopefully we can have better success if we can get some lines and tubes out.  They are concerned he is too much for a floor nurse at this point.  See if we can keep him in 1 more day  Disposition:  Disposition:  The patient is from: Home  Anticipate discharge to:  [facility] (SNF)  Anticipated Date of Discharge is:  March 3,2024    Barriers to discharge:  Pending Clinical improvement (more likely than not)  Patient currently is NOT MEDICALLY STABLE for discharge from the hospital from a surgery standpoint.   I updated the patient's status to the  patient, [name] nursing just outside room, and critical care team [place] NP.   Recommendations were made.  Questions were answered.  They expressed understanding & appreciation.    I reviewed nursing notes, last 24 h vitals and pain scores, last 48 h intake and output, last 24 h labs and trends, and last 24 h imaging results.  Reviewed  notes with respiratory physical therapies as well as critical care nursing.  I have reviewed this patient's available data, including medical history, events of note, test results, etc as part of my evaluation.  A significant portion of that time was spent in counseling.  Care during the described time interval was provided by me.  This care required moderate level of medical decision making.  07/07/2022    Subjective: (Chief complaint)  Confused and agitated at night.  More calm during the day.  Claims he has been passing some gas.  Small smear of stool noted yesterday.  Objective:  Vital signs:  Vitals:   07/07/22 0650 07/07/22 0655 07/07/22 0700 07/07/22 0712  BP:   (!) 147/60   Pulse: 90 91 88   Resp: '15 15 16   '$ Temp:    98.7 F (37.1 C)  TempSrc:    Oral  SpO2: 99% 97% 99%   Weight:      Height:        Last BM Date : 06/30/22  Intake/Output   Yesterday:  02/27 0701 - 02/28 0700 In: 2640.3 [I.V.:1325.3; IV Piggyback:1315.1] Out:  1875 [Urine:1475; Emesis/NG output:400] This shift:  Total I/O In: 189.3 [I.V.:89.3; IV Piggyback:100] Out: -   Bowel function:  Flatus: No  BM:  No  Drain:  Nasogastric tube with lightly bilious tinged effluent.  Rather thin.   Physical Exam:  General: Pt awake/alert in no acute distress.  A little tired but not toxic. Eyes: PERRL, normal EOM.  Sclera clear.  No icterus Neuro: CN II-XII intact w/o focal sensory/motor deficits. Lymph: No head/neck/groin lymphadenopathy Psych:  No delerium/psychosis/paranoia.  Oriented x 4 HENT: Normocephalic, Mucus membranes moist.  No thrush Neck: Supple, No tracheal deviation.  No obvious thyromegaly Chest: No pain to chest wall compression.  Good respiratory excursion.  No audible wheezing CV:  Pulses intact.  Regular rhythm.  No major extremity edema MS: Normal AROM mjr joints.  No obvious deformity  Abdomen: Soft.  Nondistended.  Nontender.  Right upper quadrant old colostomy wound closed  with minimal sanguinous drainage.  No purulence or cellulitis.  No guarding nor evidence of peritonitis.  No incarcerated hernias.  Ext:   No deformity.  No mjr edema.  No cyanosis Skin: No petechiae / purpurea.  No major sores.  Warm and dry    Results:   Cultures: Recent Results (from the past 720 hour(s))  MRSA Next Gen by PCR, Nasal     Status: None   Collection Time: 07/03/22  3:16 PM   Specimen: Nasal Mucosa; Nasal Swab  Result Value Ref Range Status   MRSA by PCR Next Gen NOT DETECTED NOT DETECTED Final    Comment: (NOTE) The GeneXpert MRSA Assay (FDA approved for NASAL specimens only), is one component of a comprehensive MRSA colonization surveillance program. It is not intended to diagnose MRSA infection nor to guide or monitor treatment for MRSA infections. Test performance is not FDA approved in patients less than 3 years old. Performed at [facility], [place] [address] [place], [place] [address]     Labs: Results for orders placed or performed during the hospital encounter of 07/01/22 (from the past 48 hour(s))  Glucose, capillary     Status: None   Collection Time: 07/05/22  9:09 AM  Result Value Ref Range   Glucose-Capillary 72 70 - 99 mg/dL    Comment: Glucose reference range applies only to samples taken after fasting for at least 8 hours.  Glucose, capillary     Status: None   Collection Time: 07/05/22 11:14 AM  Result Value Ref Range   Glucose-Capillary 70 70 - 99 mg/dL    Comment: Glucose reference range applies only to samples taken after fasting for at least 8 hours.  Glucose, capillary     Status: None   Collection Time: 07/05/22  3:49 PM  Result Value Ref Range   Glucose-Capillary 79 70 - 99 mg/dL    Comment: Glucose reference range applies only to samples taken after fasting for at least 8 hours.  Basic metabolic panel     Status: Abnormal   Collection Time: 07/05/22  6:07 PM  Result Value Ref Range   Sodium 143 135 - 145  mmol/L    Comment: DELTA CHECK NOTED   Potassium 2.9 (L) 3.5 - 5.1 mmol/L   Chloride 111 98 - 111 mmol/L   CO2 26 22 - 32 mmol/L   Glucose, Bld 115 (H) 70 - 99 mg/dL    Comment: Glucose reference range applies only to samples taken after fasting for at least 8 hours.   BUN 21 8 - 23  mg/dL   Creatinine, Ser 1.47 (H) 0.61 - 1.24 mg/dL   Calcium 7.6 (L) 8.9 - 10.3 mg/dL   GFR, Estimated 53 (L) >60 mL/min    Comment: (NOTE) Calculated using the CKD-EPI Creatinine Equation (2021)    Anion gap 6 5 - 15    Comment: Performed at [facility], [place] [address] [place], [place] [address]  Magnesium     Status: None   Collection Time: 07/05/22  6:07 PM  Result Value Ref Range   Magnesium 2.2 1.7 - 2.4 mg/dL    Comment: Performed at [facility], [place] [address], [place], [place] [address]  CBC     Status: Abnormal   Collection Time: 07/05/22  6:08 PM  Result Value Ref Range   WBC 13.4 (H) 4.0 - 10.5 K/uL   RBC 3.28 (L) 4.22 - 5.81 MIL/uL   Hemoglobin 8.6 (L) 13.0 - 17.0 g/dL    Comment: Reticulocyte Hemoglobin testing may be clinically indicated, consider ordering this additional test [id number]    HCT 25.3 (L) 39.0 - 52.0 %   MCV 77.1 (L) 80.0 - 100.0 fL   MCH 26.2 26.0 - 34.0 pg   MCHC 34.0 30.0 - 36.0 g/dL   RDW 14.3 11.5 - 15.5 %   Platelets 276 150 - 400 K/uL   nRBC 0.0 0.0 - 0.2 %    Comment: Performed at [facility], [place] [address] [place], [place] [address]  Glucose, capillary     Status: None   Collection Time: 07/05/22  7:57 PM  Result Value Ref Range   Glucose-Capillary 97 70 - 99 mg/dL    Comment: Glucose reference range applies only to samples taken after fasting for at least 8 hours.   Comment 1 Notify RN    Comment 2 Document in Chart   Glucose, capillary     Status: Abnormal   Collection Time: 07/05/22 11:19 PM  Result Value Ref Range   Glucose-Capillary 108 (H) 70 - 99 mg/dL    Comment: Glucose reference  range applies only to samples taken after fasting for at least 8 hours.   Comment 1 Notify RN    Comment 2 Document in Chart   Glucose, capillary     Status: Abnormal   Collection Time: 07/06/22  3:42 AM  Result Value Ref Range   Glucose-Capillary 122 (H) 70 - 99 mg/dL    Comment: Glucose reference range applies only to samples taken after fasting for at least 8 hours.   Comment 1 Notify RN    Comment 2 Document in Chart   Basic metabolic panel     Status: Abnormal   Collection Time: 07/06/22  6:22 AM  Result Value Ref Range   Sodium 143 135 - 145 mmol/L   Potassium 3.4 (L) 3.5 - 5.1 mmol/L   Chloride 108 98 - 111 mmol/L   CO2 27 22 - 32 mmol/L   Glucose, Bld 120 (H) 70 - 99 mg/dL    Comment: Glucose reference range applies only to samples taken after fasting for at least 8 hours.   BUN 18 8 - 23 mg/dL   Creatinine, Ser 1.47 (H) 0.61 - 1.24 mg/dL   Calcium 7.9 (L) 8.9 - 10.3 mg/dL   GFR, Estimated 53 (L) >60 mL/min    Comment: (NOTE) Calculated using the CKD-EPI Creatinine Equation (2021)    Anion gap 8 5 - 15    Comment: Performed at [facility], [place]  [address] [place], [place] [address]  CBC     Status: Abnormal   Collection Time: 07/06/22  6:22 AM  Result Value Ref Range   WBC 12.5 (H) 4.0 - 10.5 K/uL   RBC 3.29 (L) 4.22 - 5.81 MIL/uL   Hemoglobin 8.6 (L) 13.0 - 17.0 g/dL    Comment: Reticulocyte Hemoglobin testing may be clinically indicated, consider ordering this additional test [id number]    HCT 25.4 (L) 39.0 - 52.0 %   MCV 77.2 (L) 80.0 - 100.0 fL   MCH 26.1 26.0 - 34.0 pg   MCHC 33.9 30.0 - 36.0 g/dL   RDW 14.3 11.5 - 15.5 %   Platelets 285 150 - 400 K/uL   nRBC 0.0 0.0 - 0.2 %    Comment: Performed at [facility], [place] [address], [place], [place] [address]  Glucose, capillary     Status: Abnormal   Collection Time: 07/06/22  7:54 AM  Result Value Ref Range   Glucose-Capillary 117 (H) 70 - 99 mg/dL    Comment: Glucose  reference range applies only to samples taken after fasting for at least 8 hours.  Magnesium     Status: None   Collection Time: 07/06/22  8:30 AM  Result Value Ref Range   Magnesium 2.1 1.7 - 2.4 mg/dL    Comment: Performed at [facility], [place] [address] [place],  [address]  Prealbumin     Status: Abnormal   Collection Time: 07/06/22  8:30 AM  Result Value Ref Range   Prealbumin 12 (L) 18 - 38 mg/dL    Comment: Performed at [place] [address], [place], [place] [address]  Glucose, capillary     Status: Abnormal   Collection Time: 07/06/22 12:27 PM  Result Value Ref Range   Glucose-Capillary 108 (H) 70 - 99 mg/dL    Comment: Glucose reference range applies only to samples taken after fasting for at least 8 hours.   Comment 1 Notify RN    Comment 2 Document in Chart   Glucose, capillary     Status: Abnormal   Collection Time: 07/06/22  4:21 PM  Result Value Ref Range   Glucose-Capillary 122 (H) 70 - 99 mg/dL    Comment: Glucose reference range applies only to samples taken after fasting for at least 8 hours.   Comment 1 Notify RN    Comment 2 Document in Chart   Glucose, capillary     Status: Abnormal   Collection Time: 07/06/22  7:44 PM  Result Value Ref Range   Glucose-Capillary 105 (H) 70 - 99 mg/dL    Comment: Glucose reference range applies only to samples taken after fasting for at least 8 hours.   Comment 1 Notify RN    Comment 2 Document in Chart   Glucose, capillary     Status: Abnormal   Collection Time: 07/06/22 11:39 PM  Result Value Ref Range   Glucose-Capillary 111 (H) 70 - 99 mg/dL    Comment: Glucose reference range applies only to samples taken after fasting for at least 8 hours.   Comment 1 Notify RN    Comment 2 Document in Chart   Glucose, capillary     Status: Abnormal   Collection Time: 07/07/22  4:54 AM  Result Value Ref Range   Glucose-Capillary 114 (H) 70 - 99 mg/dL    Comment: Glucose reference range  applies only to samples taken after fasting for at least 8 hours.  Comment 1 Notify RN    Comment 2 Document in Chart   CBC     Status: Abnormal   Collection Time: 07/07/22  5:00 AM  Result Value Ref Range   WBC 13.9 (H) 4.0 - 10.5 K/uL   RBC 3.27 (L) 4.22 - 5.81 MIL/uL   Hemoglobin 8.6 (L) 13.0 - 17.0 g/dL    Comment: Reticulocyte Hemoglobin testing may be clinically indicated, consider ordering this additional test [id number]    HCT 25.4 (L) 39.0 - 52.0 %   MCV 77.7 (L) 80.0 - 100.0 fL   MCH 26.3 26.0 - 34.0 pg   MCHC 33.9 30.0 - 36.0 g/dL   RDW 14.8 11.5 - 15.5 %   Platelets 309 150 - 400 K/uL   nRBC 0.0 0.0 - 0.2 %    Comment: Performed at [facility], [place] [address] [place], [place] [address]  Potassium     Status: None   Collection Time: 07/07/22  5:00 AM  Result Value Ref Range   Potassium 3.5 3.5 - 5.1 mmol/L    Comment: Performed at [facility], [place] [address] [place], [place] [address]  Creatinine, serum     Status: Abnormal   Collection Time: 07/07/22  5:00 AM  Result Value Ref Range   Creatinine, Ser 1.56 (H) 0.61 - 1.24 mg/dL   GFR, Estimated 49 (L) >60 mL/min    Comment: (NOTE) Calculated using the CKD-EPI Creatinine Equation (2021) Performed at [facility], [place] [address] [place], [place] [address]     Imaging / Studies: DG Abd 1 View  Result Date: 07/06/2022 CLINICAL DATA:  Orogastric tube placement EXAM: ABDOMEN - 1 VIEW COMPARISON:  07/03/2022 FINDINGS: Orogastric side port and tip are in the stomach body. Right Port-A-Cath tip: Right atrium. Continued elevated right hemidiaphragm with suspected right basilar atelectasis. Mildly dilated loops of small bowel up to about 3.2 cm in diameter, previously 3.7 cm. Previous postoperative pneumoperitoneum below the right hemidiaphragm is less distinct and may have resolved or at least partially resolved. IMPRESSION: 1. Orogastric side port and tip are in  the stomach body. 2. Mildly dilated loops of small bowel up to 3.2 cm, previously 3.7 cm. 3. Previous postoperative pneumoperitoneum is less distinct and may have resolved or at least partially resolved. Electronically Signed   By: Van Clines M.D.   On: 07/06/2022 08:25    Medications / Allergies: per chart  Antibiotics: Anti-infectives (From admission, onward)    Start     Dose/Rate Route Frequency Ordered Stop   07/06/22 0915  erythromycin 250 mg in sodium chloride 0.9 % 100 mL IVPB        250 mg 100 mL/hr over 60 Minutes Intravenous Every 8 hours 07/06/22 0823 07/09/22 0959   07/03/22 1700  piperacillin-tazobactam (ZOSYN) IVPB 3.375 g        3.375 g 12.5 mL/hr over 240 Minutes Intravenous Every 8 hours 07/03/22 1612     07/02/22 0000  cefoTEtan (CEFOTAN) 2 g in sodium chloride 0.9 % 100 mL IVPB        2 g 200 mL/hr over 30 Minutes Intravenous Every 12 hours 07/01/22 1737 07/02/22 0051   07/01/22 1400  neomycin (MYCIFRADIN) tablet 1,000 mg  Status:  Discontinued       See Hyperspace for full Linked Orders Report.   1,000 mg Oral 3 times per day 07/01/22 1054 07/01/22 1109   07/01/22 1400  metroNIDAZOLE (FLAGYL) tablet 1,000 mg  Status:  Discontinued       See Hyperspace for full Linked Orders Report.   1,000 mg Oral 3 times per day 07/01/22 1054 07/01/22 1109   07/01/22 1100  cefoTEtan (CEFOTAN) 2 g in sodium chloride 0.9 % 100 mL IVPB        2 g 200 mL/hr over 30 Minutes Intravenous On call to O.R. 07/01/22 1054 07/01/22 1204         Note: Portions of this report may have been transcribed using voice recognition software. Every effort was made to ensure accuracy; however, inadvertent computerized transcription errors may be present.   Any transcriptional errors that result from this process are unintentional.    Adin Hector, MD, FACS, MASCRS Esophageal, Gastrointestinal & Colorectal Surgery Robotic and Minimally Invasive Surgery  [facility]. [address] [place], [place] [address] [phone] Fax [phone] Main  CONTACT INFORMATION:  Weekday (9AM-5PM): Call [facility] main office at [phone]  Weeknight (5PM-9AM) or Weekend/Holiday: Check [email or web address] (password " [email or web address]") for General Surgery [facility] coverage  (Please, do not use SecureChat as it is not reliable communication to reach operating surgeons for immediate patient care given surgeries/outpatient duties/clinic/cross-coverage/off post-call which would lead to a delay in care.  Epic staff messaging available for outptient concerns, but may not be answered for 48 hours or more).     07/07/2022  7:53 AM

## 2022-07-07 NOTE — TOC Initial Note (Signed)
Transition of Care [facility]) - Initial/Assessment Note    Patient Details  Name: Thomas [name] MRN: [id number] Date of Birth: 18-Sep-1955  Transition of Care [facility]) CM/SW Contact:    Roseanne Kaufman, RN Phone Number: 07/07/2022, 6:13 PM  Clinical Narrative:      Per chart review patient has history of colorectal cancer. PT has recommended HHPT services for patient. This RNCM spoke with patient at bedside who was experiencing hiccups on entrance to room. Patient reports he lives at home with his wife, son, son's wife and grandson. This RNCM offered choice for HHPT services. Patient will accept any home health agency that accepts his insurance.  This RNCM spoke with Claiborne Billings with [facility] who will follow patient at discharge. MD notified, Awaiting HHPT, HHA orders.   Transportation at discharge: family            TOC will follow for needs.  Expected Discharge Plan: [place] Barriers to Discharge: Continued Medical Work up   Patient Goals and CMS Choice Patient states their goals for this hospitalization and ongoing recovery are:: return home CMS Medicare.gov Compare Post Acute Care list provided to:: Patient Choice offered to / list presented to : Patient [place] ownership interest in [facility].provided to:: Patient    Expected Discharge Plan and Services In-house Referral: NA Discharge Planning Services: CM Consult Post Acute Care Choice: [place] arrangements for the past 2 months: Single Family Home                 DME Arranged: N/A DME Agency: NA       HH Arranged: PT, Nurse's Aide          Prior Living Arrangements/Services Living arrangements for the past 2 months: Single Family Home Lives with:: Relatives, Spouse, Other (Comment) (lives with wife, son and son's wife and grandson) Patient language and need for interpreter reviewed:: Yes Do you feel safe going back to the place where you live?: Yes      Need for Family  Participation in Patient Care: No (Comment) Care giver support system in place?: Yes (comment) Current home services: DME (cane, walker, w/c, shower chair) Criminal Activity/Legal Involvement Pertinent to Current Situation/Hospitalization: No - Comment as needed  Activities of Daily Living Home Assistive Devices/Equipment: Ostomy supplies ADL Screening (condition at time of admission) Patient's cognitive ability adequate to safely complete daily activities?: Yes Is the patient deaf or have difficulty hearing?: No Does the patient have difficulty seeing, even when wearing glasses/contacts?: Yes Does the patient have difficulty concentrating, remembering, or making decisions?: Yes Patient able to express need for assistance with ADLs?: No Does the patient have difficulty dressing or bathing?: No Independently performs ADLs?: Yes (appropriate for developmental age) Does the patient have difficulty walking or climbing stairs?: No Weakness of Legs: None Weakness of Arms/Hands: None  Permission Sought/Granted Permission sought to share information with : Case Manager Permission granted to share information with : Yes, Verbal Permission Granted  Share Information with NAME: Case Manager           Emotional Assessment Appearance:: Appears stated age Attitude/Demeanor/Rapport: Engaged Affect (typically observed): Accepting Orientation: : Oriented to Self, Oriented to Place, Oriented to Situation Alcohol / Substance Use: Not Applicable Psych Involvement: No (comment)  Admission diagnosis:  History of colorectal cancer [Z85.048] Colon cancer [facility]) [C18.9] Patient Active Problem List   Diagnosis Date Noted   Insomnia due to medical condition 07/07/2022   Hypokalemia 07/06/2022   CKD (  chronic kidney disease) stage 3, GFR 30-59 ml/min (HCC) 07/06/2022   Delirium 07/05/2022   History of colorectal cancer 07/01/2022   History of meningioma of the brain 06/07/2022   Incisional hernia,  without obstruction or gangrene 06/07/2022   Viral gastroenteritis 09/29/2021   Diabetic neuropathy ([place]) 09/04/2021   Gastroesophageal reflux disease without esophagitis 03/18/2021   Epilepsy ([place])    Hyperlipidemia    Peripheral neuropathy    Cerebral embolism with cerebral infarction 02/06/2021   Acute esophagitis    Duodenal ulcer disease    Palliative care by specialist    Acute respiratory failure with hypoxemia ([place]) 12/07/2020   Shock ([place])    Acute metabolic encephalopathy 0000000   Malnutrition of moderate degree 12/03/2020   Atrial fibrillation with RVR ([place])    Sepsis ([place])    Adverse effect of chemotherapy 12/01/2020   Pressure injury of skin 12/01/2020   Acute renal failure superimposed on stage 2 chronic kidney disease ([place]) 10/01/2020   Hyponatremia 10/01/2020   Iron deficiency anemia due to chronic blood loss 10/01/2020   Colon cancer ([place]) 09/23/2020   History of ischemic left MCA stroke 2015 09/11/2020   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    Rectal polyp    Diverticulosis of colon without hemorrhage    Abdominal pain 09/04/2020   Diabetic polyneuropathy associated with type 2 diabetes mellitus ([place]) 06/28/2018   Venous insufficiency of both lower extremities 06/28/2018   S/P craniotomy 09/01/2016   Meningioma ([place]) 09/01/2016   Trauma    Type 2 diabetes mellitus with diabetic nephropathy, without long-term current use of insulin ([place])    Hypertension    Acute encephalopathy 04/09/2014   Hyperammonemia ([place]) 04/09/2014   PCP:  [name], NP Pharmacy:   [facility] [place] [place] [address] [place] [place] [address] Phone: [phone] Fax: [phone]     Social Determinants of Health (SDOH) Social History: SDOH Screenings   Food Insecurity: No Food Insecurity (07/01/2022)  Housing: Low Risk  (07/01/2022)   Transportation Needs: No Transportation Needs (07/01/2022)  Utilities: Not At Risk (07/01/2022)  Depression (PHQ2-9): Low Risk  (04/28/2021)  Tobacco Use: Medium Risk (07/06/2022)   SDOH Interventions: Food Insecurity Interventions: Intervention Not Indicated Housing Interventions: Intervention Not Indicated Transportation Interventions: Intervention Not Indicated Utilities Interventions: Intervention Not Indicated   Readmission Risk Interventions    07/07/2022    5:56 PM 02/16/2021   10:08 AM  Readmission Risk Prevention Plan  Transportation Screening Complete Complete  PCP or Specialist Appt within 3-5 Days Complete   HRI or Home Care Consult Complete   Social Work Consult for [place] Planning/Counseling Complete   Palliative Care Screening Not Applicable   Medication Review Press photographer) Complete Complete  PCP or Specialist appointment within 3-5 days of discharge  Complete  HRI or Home Care Consult  Complete  SW Recovery Care/Counseling Consult  Complete  Palliative Care Screening  Complete  Skilled Nursing Facility  Complete

## 2022-07-07 NOTE — Progress Notes (Signed)
NAME:  Thomas [name], MRN:  [id number], DOB:  December 08, 1955, LOS: 6 ADMISSION DATE:  07/01/2022, CONSULTATION DATE: July 04, 2022 REFERRING MD: Ninfa Linden, CHIEF COMPLAINT: Shortness of breath  History of Present Illness:  67 year old male with a complex surgical history was admitted for an elective robotic ostomy takedown with lysis of adhesions on February 22, postoperatively noted to have right lower lobe collapse and hypoxemia.  Moved to stepdown on February 24 for management of the same.  Pulmonary critical care medicine consulted on February 25 for mucous plugging/right lower lobe collapse and hypoxemia. He tells me that after his diet was advanced on 2/23 he choked on food a few times (both solids and liquids). He doesn't feel short of breath, isn't coughing much.   Pertinent  Medical History  Colon cancer diagnosed in 2022, treated with transverse colectomy which was complicated and required prolonged ICU stay involving temporary hemodialysis, tracheostomy placement. 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 Developed delirium added HS haldol and added back ativan    Interim History / Subjective:  Still having rough nights w/ pain. He is oriented  Objective   Blood pressure (Abnormal) 147/60, pulse 88, temperature 98.7 F (37.1 C), temperature source Oral, resp. rate 16,  height '5\' 8"'$  (1.727 m), weight 92.5 kg, SpO2 99 %.        Intake/Output Summary (Last 24 hours) at 07/07/2022 [id number] Last data filed at 07/07/2022 0600 Gross per 24 hour  Intake 2640.32 ml  Output 1875 ml  Net 765.32 ml   Filed Weights   07/05/22 0500 07/06/22 0500 07/07/22 0500  Weight: 86.5 kg 93 kg 92.5 kg    Examination:  General this is a 67 year old male who is resting in bed,  HENT NCAT lips are dry and cracked. NGT in place Pulm dec bases Card rrr Abd soft, dressing intact. Pain to palp  Ext warm an dry  Neuro awake and oriented today   Resolved Hospital Problem list     Assessment & Plan:  Hypoxemia due to right lower lobe collapse Mucous plugging right lower lobe Dyphagia, aspiration event on 2/23 CXR much improved Plan Keep NPO Cont IS and flutter Cont HT saline neb Cont NGT Get him OOB Wean oxygen  Mobilize  Day 5 of 5 zosyn  Acute on chronic kidney disease CKD stage IIIb Hypokalemia  Scr improving Plan Cont D5LR at 50 cc/hr  Replace K   Status post lysis of adhesions colostomy takedown Plan NGT per surgical team Wound care/pain meds/nutrition per primary Aggressive K replacement given post-op ileus   History of hypertension and h/o heart murmur  Plan Hydralazline/metoprolol prn SBP > 160 Resume orals when OK w/ surgical team   H/o DM Plan Very Sens scale SSI  Delirium. Prob sleep related/ICU related Plan Cont Haldol at HS for now->ck QTc today  OOB Mobilize Re-orient Resumed home ativan  History of stroke, left MCA 2015 Aspiration risk Seizure disorder Plan Keppra IV ->  eventually change back to oral when OK by surg See above re: aspiration  Best Practice (right click and "Reselect all SmartList Selections" daily)   Per Primary   Critical care time: [place] ACNP-BC [place] Pager # [phone] OR # [phone] if no answer

## 2022-07-07 NOTE — Progress Notes (Signed)
Patient still not  sleeping at night. Restless and anxious. Tearful and demanding at times. States he does not understand why he is hurting so much and why we won't give him medicine to fix it. Critical care provider aware. Pain still uncontrolled even after changing frequency to q3 PRN instead of q4.

## 2022-07-08 ENCOUNTER — Inpatient Hospital Stay (HOSPITAL_COMMUNITY): Payer: Medicare PPO

## 2022-07-08 DIAGNOSIS — R066 Hiccough: Secondary | ICD-10-CM

## 2022-07-08 LAB — GLUCOSE, CAPILLARY
Glucose-Capillary: 105 mg/dL — ABNORMAL HIGH (ref 70–99)
Glucose-Capillary: 107 mg/dL — ABNORMAL HIGH (ref 70–99)
Glucose-Capillary: 110 mg/dL — ABNORMAL HIGH (ref 70–99)
Glucose-Capillary: 116 mg/dL — ABNORMAL HIGH (ref 70–99)
Glucose-Capillary: 122 mg/dL — ABNORMAL HIGH (ref 70–99)
Glucose-Capillary: 90 mg/dL (ref 70–99)
Glucose-Capillary: 95 mg/dL (ref 70–99)

## 2022-07-08 LAB — PHOSPHORUS
Phosphorus: 1.2 mg/dL — ABNORMAL LOW (ref 2.5–4.6)
Phosphorus: 2.7 mg/dL (ref 2.5–4.6)

## 2022-07-08 LAB — CBC
HCT: 25.4 % — ABNORMAL LOW (ref 39.0–52.0)
Hemoglobin: 8.7 g/dL — ABNORMAL LOW (ref 13.0–17.0)
MCH: 26.3 pg (ref 26.0–34.0)
MCHC: 34.3 g/dL (ref 30.0–36.0)
MCV: 76.7 fL — ABNORMAL LOW (ref 80.0–100.0)
Platelets: 301 10*3/uL (ref 150–400)
RBC: 3.31 MIL/uL — ABNORMAL LOW (ref 4.22–5.81)
RDW: 14.8 % (ref 11.5–15.5)
WBC: 16 10*3/uL — ABNORMAL HIGH (ref 4.0–10.5)
nRBC: 0 % (ref 0.0–0.2)

## 2022-07-08 LAB — BASIC METABOLIC PANEL
Anion gap: 5 (ref 5–15)
BUN: 16 mg/dL (ref 8–23)
CO2: 26 mmol/L (ref 22–32)
Calcium: 7.8 mg/dL — ABNORMAL LOW (ref 8.9–10.3)
Chloride: 108 mmol/L (ref 98–111)
Creatinine, Ser: 1.53 mg/dL — ABNORMAL HIGH (ref 0.61–1.24)
GFR, Estimated: 50 mL/min — ABNORMAL LOW (ref >60)
Glucose, Bld: 110 mg/dL — ABNORMAL HIGH (ref 70–99)
Potassium: 3.4 mmol/L — ABNORMAL LOW (ref 3.5–5.1)
Sodium: 139 mmol/L (ref 135–145)

## 2022-07-08 LAB — MAGNESIUM: Magnesium: 2 mg/dL (ref 1.7–2.4)

## 2022-07-08 MED ORDER — SODIUM CHLORIDE 0.9 % IV SOLN: INTRAVENOUS | Status: DC

## 2022-07-08 MED ORDER — PIPERACILLIN-TAZOBACTAM 3.375 G IVPB
3.3750 g | Freq: Three times a day (TID) | INTRAVENOUS | Status: DC
  Administered 2022-07-08 – 2022-07-09 (×5): 3.375 g via INTRAVENOUS
  Filled 2022-07-08 (×5): qty 50

## 2022-07-08 MED ORDER — SODIUM CHLORIDE 0.9 % IV SOLN
250.0000 mg | Freq: Two times a day (BID) | INTRAVENOUS | Status: DC
  Administered 2022-07-08 (×2): 250 mg via INTRAVENOUS
  Filled 2022-07-08 (×4): qty 5

## 2022-07-08 MED ORDER — BISACODYL 10 MG RE SUPP: 10.0000 mg | Freq: Every day | RECTAL | Status: DC

## 2022-07-08 MED ORDER — SODIUM CHLORIDE 0.9 % IV SOLN: INTRAVENOUS | Status: DC | PRN

## 2022-07-08 MED ORDER — POTASSIUM PHOSPHATES 15 MMOLE/5ML IV SOLN
45.0000 mmol | Freq: Once | INTRAVENOUS | Status: AC
  Administered 2022-07-08: 45 mmol via INTRAVENOUS
  Filled 2022-07-08: qty 15

## 2022-07-08 MED ORDER — METOCLOPRAMIDE HCL 5 MG/ML IJ SOLN
5.0000 mg | Freq: Four times a day (QID) | INTRAMUSCULAR | Status: AC
  Administered 2022-07-08 – 2022-07-11 (×12): 5 mg via INTRAVENOUS
  Filled 2022-07-08 (×12): qty 2

## 2022-07-08 MED ORDER — TRAVASOL 10 % IV SOLN
INTRAVENOUS | Status: AC
  Filled 2022-07-08: qty 480

## 2022-07-08 NOTE — Progress Notes (Signed)
Initial Nutrition Assessment  DOCUMENTATION CODES:   Not applicable  INTERVENTION:  - TPN to start tonight at 5m/hr per pharmacy. This provides 48g protein (0.5g/kg) , 144g dextrose (GIR 1.'14mg'$ /kg/min), 288 IL kcals (30% of kcals) = 970 kcal (11 kcal/kg)  - TPN management per pharmacy.   - Monitor magnesium, potassium, and phosphorus BID for at least 3 days, MD to replete as needed, as pt is at risk for refeeding syndrome given minimal/no intake x7 days. - Recommend '100mg'$  thiamine x5 days to support macronutrient metabolism.   - Monitor weight trends.   NUTRITION DIAGNOSIS:   Inadequate oral intake related to inability to eat (due to ileus) as evidenced by NPO status (starting TPN).  GOAL:   Patient will meet greater than or equal to 90% of their needs  MONITOR:   Diet advancement, Labs, Weight trends  REASON FOR ASSESSMENT:   Consult New TPN/TNA  ASSESSMENT:   67year old male with history of transverse colon mass s/p transverse colectomy, DM2, who was admitted for an elective robotic ostomy takedown with lysis of adhesions on 07/01/22. Postoperatively noted to have right lower lobe collapse and hypoxemia as well as ileus.  2/22 takedown of end colostomy and anastomosis, lysis of adhesions, and repair of incarcerated incisional hernia 2/23 advanced to DYS 1 diet 2/24 pt vomited, NG placed->LIS 2/28 CL diet 2/29 DYS 1 diet, starting TPN  Patient reports being very uncomfortable at time of visit due to pain and nausea.  Endorses a UBW of 220# and weight loss over the past 6 months to 195#.  Per EMR, patient weighed at 235# ~1 year ago and has since dropped to current weight of 194# - which is a 41# or 17% loss. This is not significant for a time frame of 1 year.   Patient endorses that prior to admission he was eating around 1 or 1.5 meals a day. Having mostly soft foods. Since admission 7 days ago, patient reports he has had nothing to eat. Diet has been advanced to  DYS 1 twice but patient has not been tolerating. Back on clear liquids today. Patient states he has no desires to eat anything but has had a few sips of water.   Discussed plan to start TPN this evening and patient endorsed understanding. Exam deferred as patient reports he would like to not have exam done due to severity of discomfort.   Medications reviewed and include: Dulcolax, Insulin, Reglan, D5 @ 557mhr (provides 204 kcals over 24 hours) (ending once starting TPN)  Labs reviewed:  K+ 3.4 Creatinine 1.53 Phosphorus 1.2 HA1C 5.1   NUTRITION - FOCUSED PHYSICAL EXAM:  Patient declined exam due to pain, nausea, discomfort. Will attempt at next visit  Diet Order:   Diet Order             Diet clear liquid Room service appropriate? Yes; Fluid consistency: Thin  Diet effective now           Diet - low sodium heart healthy                   EDUCATION NEEDS:  Education needs have been addressed  Skin:  Skin Assessment: Skin Integrity Issues: Skin Integrity Issues:: Incisions Incisions: R Abdomen  Last BM:  2/29  Height:  Ht Readings from Last 1 Encounters:  07/01/22 '5\' 8"'$  (1.727 m)   Weight:  Wt Readings from Last 1 Encounters:  07/08/22 88 kg    BMI:  Body mass index is 29.5 kg/m.  Estimated Nutritional Needs:  Kcal:  2400-2600 kcals Protein:  125-140 grams Fluid:  >/= 2.4L    Samson Frederic RD, LDN For contact information, refer to [facility].

## 2022-07-08 NOTE — Progress Notes (Signed)
Occupational Therapy Treatment Patient Details Name: Thomas [name] MRN: [id number] DOB: August 03, 1955 Today's Date: 07/08/2022   History of present illness Patient is a 67 yo male admitted 07/01/22  S/P LYSIS OF ADHESIONS ,  PRIMARY REPAIR OF INCARCERATED INCISIONAL HERNIA , TAKEDOWN OF END COLOSTOMY WITH ANASTOMOSIS.2/24 moved to stepdown unit for hypoxemia, right lower lobe collapse CT chest showed debris in right lower lobe with atelectasis PMH, colon and rectal  cancer, colostomy, VDRF with trach 2022,   DVT, afib, DM, HTN, seizure, CVA, fromtal meningioma, S./P lobectomy.   OT comments  Patient limited by pain and only able to tolerate rolling, standing and transfer to recliner. He didn't need physical assist just verbal cues and line management. He was encouraged to sit up and try to eat. Nursing in room to answer questions.    Recommendations for follow up therapy are one component of a multi-disciplinary discharge planning process, led by the attending physician.  Recommendations may be updated based on patient status, additional functional criteria and insurance authorization.    Follow Up Recommendations  Skilled nursing-short term rehab (<3 hours/day)     Assistance Recommended at Discharge Frequent or constant Supervision/Assistance  Patient can return home with the following  A lot of help with bathing/dressing/bathroom;Assistance with cooking/housework;Direct supervision/assist for medications management;Assist for transportation;Direct supervision/assist for financial management;Help with stairs or ramp for entrance;A lot of help with walking and/or transfers   Equipment Recommendations  BSC/3in1    Recommendations for Other Services      Precautions / Restrictions Precautions Precautions: Fall Precaution Comments: NG suction Restrictions Weight Bearing Restrictions: No       Mobility Bed Mobility Overal bed mobility: Needs Assistance Bed Mobility: Rolling, Sidelying to  Sit Rolling: Supervision Sidelying to sit: Min guard       General bed mobility comments: verbal cues for log roll technique but no physical assistance    Transfers Overall transfer level: Needs assistance Equipment used: Rolling walker (2 wheels) Transfers: Sit to/from Stand Sit to Stand: Min guard           General transfer comment: Min guard with RW - no physical assist just assistance for lines     Balance Overall balance assessment: Mild deficits observed, not formally tested                                         ADL either performed or assessed with clinical judgement   ADL Overall ADL's : Needs assistance/impaired                             Toileting- Clothing Manipulation and Hygiene: Total assistance Toileting - Clothing Manipulation Details (indicate cue type and reason): has catheter and rectal bag            Extremity/Trunk Assessment Upper Extremity Assessment Upper Extremity Assessment: Overall WFL for tasks assessed   Lower Extremity Assessment Lower Extremity Assessment: Defer to PT evaluation   Cervical / Trunk Assessment Cervical / Trunk Assessment: Normal Cervical / Trunk Exceptions: abdominal surgery    Vision   Vision Assessment?: No apparent visual deficits   Perception     Praxis      Cognition Arousal/Alertness: Awake/alert Behavior During Therapy: Anxious Overall Cognitive Status: Within Functional Limits for tasks assessed  Exercises      Shoulder Instructions       General Comments      Pertinent Vitals/ Pain       Pain Assessment Pain Assessment: Faces Faces Pain Scale: Hurts even more Pain Location: abbdomen Pain Descriptors / Indicators: Discomfort, Grimacing Pain Intervention(s): Monitored during session  Home Living                                          Prior Functioning/Environment               Frequency  Min 2X/week        Progress Toward Goals  OT Goals(current goals can now be found in the care plan section)  Progress towards OT goals: OT to reassess next treatment  Acute Rehab OT Goals Patient Stated Goal: to go home OT Goal Formulation: With patient Time For Goal Achievement: 07/20/22 Potential to Achieve Goals: [place] Discharge plan remains appropriate    Co-evaluation                 AM-PAC OT "6 Clicks" Daily Activity     Outcome Measure   Help from another person eating meals?: None Help from another person taking care of personal grooming?: A Little Help from another person toileting, which includes using toliet, bedpan, or urinal?: Total Help from another person bathing (including washing, rinsing, drying)?: A Lot Help from another person to put on and taking off regular upper body clothing?: A Little Help from another person to put on and taking off regular lower body clothing?: A Lot 6 Click Score: 15    End of Session Equipment Utilized During Treatment: Rolling walker (2 wheels)  OT Visit Diagnosis: Unsteadiness on feet (R26.81);Other abnormalities of gait and mobility (R26.89);Muscle weakness (generalized) (M62.81);Pain   Activity Tolerance Patient limited by pain   Patient Left in chair;with call bell/phone within reach;with nursing/sitter in room   Nurse Communication Mobility status        Time: [id number] OT Time Calculation (min): 10 min  Charges: OT General Charges $OT Visit: 1 Visit OT Treatments $Therapeutic Activity: 8-22 mins  Gustavo Lah, OTR/L Acute Care Rehab Services  Office [phone]   Lenward Chancellor 07/08/2022, 2:52 PM

## 2022-07-08 NOTE — Progress Notes (Signed)
[place] Progress Note Patient Name: Thomas [name] DOB: 08/24/1955 MRN: [id number]   Date of Service  07/08/2022  HPI/Events of Note  K+ 3.4, PO4 1.2  eICU Interventions  Electrolytes replaced per E-link electrolyte replacement protocol.        Frederik Pear 07/08/2022, 4:33 AM

## 2022-07-08 NOTE — Progress Notes (Signed)
Pt NG put to gravity drain per order.  0800- 12 cc residual 1200- 200 cc residual 1600- 100 cc residual   Values charted in I&O comment section.   Pt denies nausea & has not had any vomiting

## 2022-07-08 NOTE — Progress Notes (Signed)
Thomas [name] [id number] Apr 21, 1956  CARE TEAM:  PCP: [name], NP  Outpatient Care Team: Patient Care Team: [name], NP as PCP - General Benito Mccreedy, MD (Internal Medicine) Patient, No Pcp Per (General Practice) Michael Boston, MD as Consulting Physician (Colon and Rectal Surgery) Irene Shipper, MD as Consulting Physician (Gastroenterology) Ladell Pier, MD as Consulting Physician (Oncology)  Inpatient Treatment Team: Treatment Team: Attending Provider: Michael Boston, MD; Rounding Team: Pccm, Md, MD; Registered Nurse: Jackquline Berlin, RN; Pharmacist: Emiliano Dyer, [facility]; Registered Nurse: Virl Cagey, RN; Charge Nurse: Justice Britain, RN   Problem List:   Principal Problem:   History of colorectal cancer Active Problems:   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   History of ischemic left MCA stroke 2015   Type 2 diabetes mellitus with diabetic nephropathy, without long-term current use of insulin ([place])   Hypertension   Colon cancer ([place])   Epilepsy ([place])   Hyperlipidemia   Gastroesophageal reflux disease without esophagitis   Delirium   Hypokalemia   CKD (chronic kidney disease) stage 3, GFR 30-59 ml/min (HCC)   Insomnia due to medical condition   7 Days Post-Op  07/01/2022  POST-OPERATIVE DIAGNOSIS:   COLOSTOMY FOR COLON RESECTION INCARCERATED INCISIONAL HERNIA   PROCEDURE:   TAKEDOWN OF END COLOSTOMY WITH ANASTOMOSIS INTRAOPERATIVE ASSESSMENT OF TISSUE VASCULAR PERFUSION USING ICG (indocyanine green) IMMUNOFLUORESCENCE TRANSVERSUS ABDOMINIS PLANE (TAP) BLOCK - BILATERAL LYSIS OF ADHESIONS x90 MINUTES (50% OF CASE) PRIMARY REPAIR OF INCARCERATED INCISIONAL HERNIA   SURGEON:  Adin Hector, MD  OR FINDINGS:    Patient had very dense intra-abdominal adhesions of small bowel to all quadrants of the abdomen as well as colon.  He had a loop of small bowel incarcerated in a periumbilical midline  incisional hernia 4 x 4 cm.  Reduced and primarily repaired.   No obvious metastatic disease on visceral parietal peritoneum or liver.   It is an isoperistaltic ileocolonic anastomosis (proximal transverse colon to distal transverse colon) that rests in the epigastric region.    Assessment  Probable postop ileus with aspiration pneumonitis.  [facility] Stay = 7 days)  Plan: Numerous bowel movements hopefully sign ileus resolving but with hiccups and spitting up mucus is concern of persistent gastric ileus.  Check plain films to make sure NG tube is not migrated since it was somewhat high last time.  Continue scheduled metoclopramide and erythromycin today in hopes that helps ileus more consistently turnaround faster.  Will care aware but not feel metoclopramide is contributing to confusion or concerns.  Lower dose on metoclopramide for now.  I think we will retry liquids low volume and see if he can tolerate.  Try dysphagia 1 diet for easier swallowing.  Place nasogastric tube to gravity for now.    If he can tolerate with low volume and has evidence of bowel function, try to remove NG tube the following day.    If he has worsening abdominal pain or high residuals then keep on suction.    Low threshold to get speech therapy involved again to see what they think.  Unfortunately will not be normal with the NG tube in place and his mental status down.  Think they wish to wait until the NG tube is out.  Some delirium with anxiety.  Challenge when he is off his usual Keppra, lorazepam, gabapentin.  Stepdown setting.  Defer to pulmonary critical care.  I do not know if Haldol and Ativan are  great combination but we will have to see.  Done home and in the drawing either.  Seems to be sundowning.  Check ammonia and LFTs since he had had that elevated in the past.  -I would not trust his digestive tract for any pills till he is tolerating liquids and NG tube is out.  Since I suspect he will not  be having great p.o. in the next few days, will start parenteral nutrition.  Hopefully can use Port-A-Cath since he has kidney disease and I do not know how good his access is.  If they feel otherwise, can order PICC line.    If worsening abdominal pain or worsening leukocytosis, CT scan to rule out undrained fluid collection.  White count up a little bit but abdomen soft with ileus seem to be resolving.  Hypokalemia.  Correcting.  Still low.  Magnesium okay   Hypophosphatemia.  Agree with repletion.  Chronic kidney disease but nonoliguric.  Creatinine seems to have stabilized to his baseline in the mid ones.  Follow.  I do not see reason to keep his Foley catheter since he has a risk of UTI.  Removed with condom cath as needed and I&O as needed.  Has urinary retention despite 1 I&O, Place Foley on second I&O attempt.  IV antibiotics per pulmonary critical care.  Completed Zosyn 2/24 - 2/29.  Current concern for some plugging.  Pulmonary critical care following.  Seems to improved with physical therapy before considering bronchoscopy.  Pulmonary status seems to be stable.  Discussed with pulmonary critical care.  They recommend stepdown observation 1 more day just in case.  Some acute blood loss anemia in setting of chronic anemia of chronic disease and iron deficiency anemia.  Not hypotensive.  Follow.  -VTE prophylaxis- SCDs, etc  -mobilize as tolerated to help recovery.  Patient's sundowning challenge = less motivated.  Nursing tried to get him up in the bed.  Hopefully we can have better success if we can get some lines and tubes out.  They are concerned he is too much for a floor nurse at this point.  See if we can keep him in 1 more day  Disposition:  Disposition:  The patient is from: Home  Anticipate discharge to:  [place] (SNF)  Anticipated Date of Discharge is:  March 3,2024    Barriers to discharge:  Pending Clinical improvement (more likely than  not)  Patient currently is NOT MEDICALLY STABLE for discharge from the hospital from a surgery standpoint.   I updated the patient's status to the  patient, [name] nursing just outside room, and critical care team Gary NP.   Recommendations were made.  Questions were answered.  They expressed understanding & appreciation.    I reviewed nursing notes, last 24 h vitals and pain scores, last 48 h intake and output, last 24 h labs and trends, and last 24 h imaging results.  Reviewed notes with respiratory physical therapies as well as critical care nursing.  I have reviewed this patient's available data, including medical history, events of note, test results, etc as part of my evaluation.  A significant portion of that time was spent in counseling.  Care during the described time interval was provided by me.  This care required moderate level of medical decision making.  07/08/2022    Subjective: (Chief complaint)  Patient with numerous bowel movements.  Occasionally spitting up with clamping trials so back to suction.  Anxious and confused.  Given lorazepam  and Haldol with still some challenges.  Objective:  Vital signs:  Vitals:   07/08/22 0400 07/08/22 0407 07/08/22 0500 07/08/22 0600  BP: (!) 155/65  (!) 149/72 (!) 160/76  Pulse: 84  85 90  Resp:   18 16  Temp:  98 F (36.7 C)    TempSrc:      SpO2: 98%  96% 100%  Weight:   88 kg   Height:        Last BM Date : 07/08/22  Intake/Output   Yesterday:  02/28 0701 - 02/29 0700 In: 2435.8 [I.V.:1153.8; IV Piggyback:1282] Out: 2075 [Urine:1475; Emesis/NG output:600] This shift:  No intake/output data recorded.  Bowel function:  Flatus: No  BM:  No  Drain:  Nasogastric tube with lightly bilious tinged effluent.  Rather thin.   Physical Exam:  General: Pt awake/alert in no acute distress.  A little tired but not toxic. Eyes: PERRL, normal EOM.  Sclera clear.  No icterus Neuro: CN II-XII intact w/o focal  sensory/motor deficits. Lymph: No head/neck/groin lymphadenopathy Psych:  No delerium/psychosis/paranoia.  Oriented x 4 HENT: Normocephalic, Mucus membranes moist.  No thrush Neck: Supple, No tracheal deviation.  No obvious thyromegaly Chest: No pain to chest wall compression.  Good respiratory excursion.  No audible wheezing CV:  Pulses intact.  Regular rhythm.  No major extremity edema MS: Normal AROM mjr joints.  No obvious deformity  Abdomen: Soft.  Nondistended.  Mildly tender at incisions only .  Right upper quadrant old colostomy wound closed and dry.  Minimal sensitivity there only.  No purulence or cellulitis.  No guarding nor evidence of peritonitis.  No incarcerated hernias.  Ext:   No deformity.  No mjr edema.  No cyanosis Skin: No petechiae / purpurea.  No major sores.  Warm and dry    Results:   Cultures: Recent Results (from the past 720 hour(s))  MRSA Next Gen by PCR, Nasal     Status: None   Collection Time: 07/03/22  3:16 PM   Specimen: Nasal Mucosa; Nasal Swab  Result Value Ref Range Status   MRSA by PCR Next Gen NOT DETECTED NOT DETECTED Final    Comment: (NOTE) The GeneXpert MRSA Assay (FDA approved for NASAL specimens only), is one component of a comprehensive MRSA colonization surveillance program. It is not intended to diagnose MRSA infection nor to guide or monitor treatment for MRSA infections. Test performance is not FDA approved in patients less than 26 years old. Performed at [facility], [place] [address] [place], [place] [address]     Labs: Results for orders placed or performed during the hospital encounter of 07/01/22 (from the past 48 hour(s))  Glucose, capillary     Status: Abnormal   Collection Time: 07/06/22  7:54 AM  Result Value Ref Range   Glucose-Capillary 117 (H) 70 - 99 mg/dL    Comment: Glucose reference range applies only to samples taken after fasting for at least 8 hours.  Magnesium     Status: None    Collection Time: 07/06/22  8:30 AM  Result Value Ref Range   Magnesium 2.1 1.7 - 2.4 mg/dL    Comment: Performed at [facility], [place] [address], [place],  [address]  Prealbumin     Status: Abnormal   Collection Time: 07/06/22  8:30 AM  Result Value Ref Range   Prealbumin 12 (L) 18 - 38 mg/dL    Comment: Performed at [place] [address],  [place], [place] [address]  Glucose, capillary     Status: Abnormal   Collection Time: 07/06/22 12:27 PM  Result Value Ref Range   Glucose-Capillary 108 (H) 70 - 99 mg/dL    Comment: Glucose reference range applies only to samples taken after fasting for at least 8 hours.   Comment 1 Notify RN    Comment 2 Document in Chart   Glucose, capillary     Status: Abnormal   Collection Time: 07/06/22  4:21 PM  Result Value Ref Range   Glucose-Capillary 122 (H) 70 - 99 mg/dL    Comment: Glucose reference range applies only to samples taken after fasting for at least 8 hours.   Comment 1 Notify RN    Comment 2 Document in Chart   Glucose, capillary     Status: Abnormal   Collection Time: 07/06/22  7:44 PM  Result Value Ref Range   Glucose-Capillary 105 (H) 70 - 99 mg/dL    Comment: Glucose reference range applies only to samples taken after fasting for at least 8 hours.   Comment 1 Notify RN    Comment 2 Document in Chart   Glucose, capillary     Status: Abnormal   Collection Time: 07/06/22 11:39 PM  Result Value Ref Range   Glucose-Capillary 111 (H) 70 - 99 mg/dL    Comment: Glucose reference range applies only to samples taken after fasting for at least 8 hours.   Comment 1 Notify RN    Comment 2 Document in Chart   Glucose, capillary     Status: Abnormal   Collection Time: 07/07/22  4:54 AM  Result Value Ref Range   Glucose-Capillary 114 (H) 70 - 99 mg/dL    Comment: Glucose reference range applies only to samples taken after fasting for at least 8 hours.   Comment 1 Notify RN    Comment 2 Document in Chart    CBC     Status: Abnormal   Collection Time: 07/07/22  5:00 AM  Result Value Ref Range   WBC 13.9 (H) 4.0 - 10.5 K/uL   RBC 3.27 (L) 4.22 - 5.81 MIL/uL   Hemoglobin 8.6 (L) 13.0 - 17.0 g/dL    Comment: Reticulocyte Hemoglobin testing may be clinically indicated, consider ordering this additional test [id number]    HCT 25.4 (L) 39.0 - 52.0 %   MCV 77.7 (L) 80.0 - 100.0 fL   MCH 26.3 26.0 - 34.0 pg   MCHC 33.9 30.0 - 36.0 g/dL   RDW 14.8 11.5 - 15.5 %   Platelets 309 150 - 400 K/uL   nRBC 0.0 0.0 - 0.2 %    Comment: Performed at [facility], [place] [address], [place], [place] [address]  Potassium     Status: None   Collection Time: 07/07/22  5:00 AM  Result Value Ref Range   Potassium 3.5 3.5 - 5.1 mmol/L    Comment: Performed at [facility], [place] [address], [place], [place] [address]  Creatinine, serum     Status: Abnormal   Collection Time: 07/07/22  5:00 AM  Result Value Ref Range   Creatinine, Ser 1.56 (H) 0.61 - 1.24 mg/dL   GFR, Estimated 49 (L) >60 mL/min    Comment: (NOTE) Calculated using the CKD-EPI Creatinine Equation (2021) Performed at [facility], [place] [address], [place], [place] [address]   Glucose, capillary     Status: Abnormal   Collection Time: 07/07/22  8:10 AM  Result Value  Ref Range   Glucose-Capillary 110 (H) 70 - 99 mg/dL    Comment: Glucose reference range applies only to samples taken after fasting for at least 8 hours.   Comment 1 Notify RN    Comment 2 Document in Chart   Ammonia     Status: None   Collection Time: 07/07/22 10:06 AM  Result Value Ref Range   Ammonia 17 9 - 35 umol/L    Comment: Performed at [facility], [place] [address], [place], [place] [address]  Hepatic function panel     Status: Abnormal   Collection Time: 07/07/22 10:06 AM  Result Value Ref Range   Total Protein 5.8 (L) 6.5 - 8.1 g/dL   Albumin 2.5 (L) 3.5 - 5.0 g/dL   AST 22 15 - 41 U/L   ALT  19 0 - 44 U/L   Alkaline Phosphatase 54 38 - 126 U/L   Total Bilirubin 0.9 0.3 - 1.2 mg/dL   Bilirubin, Direct 0.2 0.0 - 0.2 mg/dL   Indirect Bilirubin 0.7 0.3 - 0.9 mg/dL    Comment: Performed at [facility], [place] [address] [place], [place] [address]  Glucose, capillary     Status: Abnormal   Collection Time: 07/07/22 11:18 AM  Result Value Ref Range   Glucose-Capillary 116 (H) 70 - 99 mg/dL    Comment: Glucose reference range applies only to samples taken after fasting for at least 8 hours.   Comment 1 Notify RN    Comment 2 Document in Chart   Glucose, capillary     Status: Abnormal   Collection Time: 07/07/22  3:57 PM  Result Value Ref Range   Glucose-Capillary 117 (H) 70 - 99 mg/dL    Comment: Glucose reference range applies only to samples taken after fasting for at least 8 hours.   Comment 1 Notify RN    Comment 2 Document in Chart   Glucose, capillary     Status: None   Collection Time: 07/07/22  7:57 PM  Result Value Ref Range   Glucose-Capillary 96 70 - 99 mg/dL    Comment: Glucose reference range applies only to samples taken after fasting for at least 8 hours.  Glucose, capillary     Status: None   Collection Time: 07/08/22 12:14 AM  Result Value Ref Range   Glucose-Capillary 90 70 - 99 mg/dL    Comment: Glucose reference range applies only to samples taken after fasting for at least 8 hours.  Basic metabolic panel     Status: Abnormal   Collection Time: 07/08/22  2:59 AM  Result Value Ref Range   Sodium 139 135 - 145 mmol/L   Potassium 3.4 (L) 3.5 - 5.1 mmol/L   Chloride 108 98 - 111 mmol/L   CO2 26 22 - 32 mmol/L   Glucose, Bld 110 (H) 70 - 99 mg/dL    Comment: Glucose reference range applies only to samples taken after fasting for at least 8 hours.   BUN 16 8 - 23 mg/dL   Creatinine, Ser 1.53 (H) 0.61 - 1.24 mg/dL   Calcium 7.8 (L) 8.9 - 10.3 mg/dL   GFR, Estimated 50 (L) >60 mL/min    Comment: (NOTE) Calculated using the CKD-EPI  Creatinine Equation (2021)    Anion gap 5 5 - 15    Comment: Performed at [facility], [place] [address], [place], [place] [address]  Magnesium     Status: None   Collection Time: 07/08/22  2:59 AM  Result Value Ref Range   Magnesium 2.0 1.7 - 2.4 mg/dL    Comment: Performed at [facility], [place] [address] [place], [place] [address]  CBC     Status: Abnormal   Collection Time: 07/08/22  2:59 AM  Result Value Ref Range   WBC 16.0 (H) 4.0 - 10.5 K/uL   RBC 3.31 (L) 4.22 - 5.81 MIL/uL   Hemoglobin 8.7 (L) 13.0 - 17.0 g/dL    Comment: Reticulocyte Hemoglobin testing may be clinically indicated, consider ordering this additional test [id number]    HCT 25.4 (L) 39.0 - 52.0 %   MCV 76.7 (L) 80.0 - 100.0 fL   MCH 26.3 26.0 - 34.0 pg   MCHC 34.3 30.0 - 36.0 g/dL   RDW 14.8 11.5 - 15.5 %   Platelets 301 150 - 400 K/uL   nRBC 0.0 0.0 - 0.2 %    Comment: Performed at [facility], [place] [address], [place], [place] [address]  Phosphorus     Status: Abnormal   Collection Time: 07/08/22  2:59 AM  Result Value Ref Range   Phosphorus 1.2 (L) 2.5 - 4.6 mg/dL    Comment: Performed at [facility], [place] [address], [place], [place] [address]  Glucose, capillary     Status: Abnormal   Collection Time: 07/08/22  4:07 AM  Result Value Ref Range   Glucose-Capillary 105 (H) 70 - 99 mg/dL    Comment: Glucose reference range applies only to samples taken after fasting for at least 8 hours.    Imaging / Studies: No results found.  Medications / Allergies: per chart  Antibiotics: Anti-infectives (From admission, onward)    Start     Dose/Rate Route Frequency Ordered Stop   07/06/22 0915  erythromycin 250 mg in sodium chloride 0.9 % 100 mL IVPB        250 mg 100 mL/hr over 60 Minutes Intravenous Every 8 hours 07/06/22 0823 07/09/22 0959   07/03/22 1700  piperacillin-tazobactam (ZOSYN) IVPB 3.375 g        3.375 g 12.5 mL/hr  over 240 Minutes Intravenous Every 8 hours 07/03/22 1612 07/08/22 0450   07/02/22 0000  cefoTEtan (CEFOTAN) 2 g in sodium chloride 0.9 % 100 mL IVPB        2 g 200 mL/hr over 30 Minutes Intravenous Every 12 hours 07/01/22 1737 07/02/22 0051   07/01/22 1400  neomycin (MYCIFRADIN) tablet 1,000 mg  Status:  Discontinued       See Hyperspace for full Linked Orders Report.   1,000 mg Oral 3 times per day 07/01/22 1054 07/01/22 1109   07/01/22 1400  metroNIDAZOLE (FLAGYL) tablet 1,000 mg  Status:  Discontinued       See Hyperspace for full Linked Orders Report.   1,000 mg Oral 3 times per day 07/01/22 1054 07/01/22 1109   07/01/22 1100  cefoTEtan (CEFOTAN) 2 g in sodium chloride 0.9 % 100 mL IVPB        2 g 200 mL/hr over 30 Minutes Intravenous On call to O.R. 07/01/22 1054 07/01/22 1204         Note: Portions of this report may have been transcribed using voice recognition software. Every effort was made to ensure accuracy; however, inadvertent computerized transcription errors may be present.   Any transcriptional errors that result from this process are unintentional.    Adin Hector, MD, FACS, MASCRS Esophageal, Gastrointestinal & Colorectal Surgery Robotic and Minimally Invasive  [facility] [place] [id number], [place], [place] [address] [phone] Fax [phone] Main  CONTACT INFORMATION:  Weekday (9AM-5PM): Call [facility] main office at [phone]  Weeknight (5PM-9AM) or Weekend/Holiday: Check [email or web address] (password " [email or web address]") for General Surgery [facility] coverage  (Please, do not use SecureChat as it is not reliable communication to reach operating surgeons for immediate patient care given surgeries/outpatient duties/clinic/cross-coverage/off post-call which would lead to a delay in care.  Epic staff messaging available for outptient concerns, but may not be answered for 48 hours or more).     07/08/2022   7:29 AM

## 2022-07-08 NOTE — Progress Notes (Addendum)
Films show small pocket of free air on right diaphragm only.  Not surprising postoperatively.  He has no evidence of peritonitis.  He does have ileus but he is finally having return of bowel function which seems reassuring.  Output from NG tube low as well.  Less distended.  Minimal tenderness just at his ostomy only.  No guarding or peritonitis..  White count up a little bit but no evidence of any hemodynamic decline or compromise.  No tachycardia or fevers.  Some delirium and confusion but not markedly worse to my view.  Continue as needed Haldol and lorazepam.  Continue plan of NG tube to gravity only with retry of liquids.  TPN.  If he tolerates and improved, reassuring.  If he does not improve or worsens, plan CAT scan tomorrow with contrast to rule out delayed leak, abscess or other concern.  1 radiologist not concern.  Another 1 is.  1 concerned not answering.  Updated ICU nursing team, [facility] ICU RN.  She does not feel he is worsening this morning either.  We will follow closely.  Keep in stepdown for now.  ADDENDUM:  Able to reach Elmer Picker M.D. with radiology.  Discussed films & plan  Adin Hector, MD, FACS, MASCRS Esophageal, Gastrointestinal & Colorectal Surgery Robotic and Minimally Invasive Surgery  [facility] [place] [id number] [place], [place] [address] [phone] Fax [phone] Main  CONTACT INFORMATION:  Weekday (9AM-5PM): Call [facility] main office at [phone]  Weeknight (5PM-9AM) or Weekend/Holiday: Check [email or web address] (password " [email or web address]") for General Surgery [facility] coverage  (Please, do not use SecureChat as it is not reliable communication to reach operating surgeons for immediate patient care given surgeries/outpatient duties/clinic/cross-coverage/off post-call which would lead to a delay in care.  Epic staff messaging available for outptient concerns, but may not be answered for 48 hours or more).

## 2022-07-08 NOTE — Progress Notes (Signed)
Consult received regarding use of PORT during TPN infusion. No access to the PORT at the Y site or extension for lab draws or other infusions while TPN is connected and/or infusing. Cindy VU. Fran Lowes, RN VAST

## 2022-07-08 NOTE — Progress Notes (Signed)
Per order clamped NG tube around midnight. Patient was nauseous and vomited around 0355am, RN unclamped NG tube and put it back on low intermittent wall suction.

## 2022-07-08 NOTE — Progress Notes (Signed)
PHARMACY - TOTAL PARENTERAL NUTRITION CONSULT NOTE   Indication: Prolonged ileus  Patient Measurements: Height: '5\' 8"'$  (172.7 cm) Weight: 88 kg (194 lb 0.1 oz) IBW/kg (Calculated) : 68.4 TPN AdjBW (KG): 73.1 Body mass index is 29.5 kg/m. Usual Weight:   Assessment: 67 yo male with hx colon cancer diagnosed 2022 s/p transverse colectomy admitted for elective robotic ostomy takedown with lysis of adhesions, incarcerated incisional hernia repair on 07/01/22. Post-op course complicated by right lower lobe collapse and hypoxemia, transferred to stepdown on 07/03/22 for management.  Pharmacy consulted for TPN on 07/08/22 for prolonged ileus.  Glucose / Insulin: Hx DM2 A1c 5.1 (07/01/22) - CBG range 96-117, goal 100-180 - No SSI required Electrolytes: K/Phos low, CorrCa WNL Renal: CKDIII, SCr near baseline Hepatic: LFTs WNL (2/28), Alb low Intake / Output: - NGT 600/24hr, to gravity today - UOP 1475 ml/24hr - 7 stools, on erythromycin, reglan (unable to give entereg while NPO) MIVF: D5LR at 21m/hr GI Imaging: - 2/27 AXR: Previous postoperative pneumoperitoneum is less distinct and may have resolved or at least partially resolved. GI Surgeries / Procedures:  - 2/22: robotic colostomy takedown and lysis of adhesions  Central access: implanted port TPN start date: plan 2/29  Nutritional Goals: Goal TPN rate is  mL/hr (provides  g of protein and  kcals per day)  RD Assessment: pending    Current Nutrition:  Dysphagia 1 diet  Plan:  Now: KPhos 456ml IV x 1 started this AM  Start TPN at 4055mr at 1800 Electrolytes in TPN: Na 58m17m, K 58mE71m Ca 5mEq/96mMg 5mEq/L39mnd Phos 15mmol/24ml:Ac 1:1 Add standard MVI and trace elements to TPN Continue Sensitive q4h SSI and adjust as needed  Reduce MIVF to KVO and [facility] to NS at 1800 Monitor TPN labs on Mon/Thurs and PRN  Nadia Viar WilPeggyann Juba, BCPS Pharmacy: [phone],7:39 AM

## 2022-07-08 NOTE — Progress Notes (Signed)
NAME:  Thomas [name], MRN:  [id number], DOB:  April 23, 1956, LOS: 7 ADMISSION DATE:  07/01/2022, CONSULTATION DATE: July 04, 2022 REFERRING MD: Ninfa Linden, CHIEF COMPLAINT: Shortness of breath  History of Present Illness:  67 year old male with a complex surgical history was admitted for an elective robotic ostomy takedown with lysis of adhesions on February 22, postoperatively noted to have right lower lobe collapse and hypoxemia.  Moved to stepdown on February 24 for management of the same.  Pulmonary critical care medicine consulted on February 25 for mucous plugging/right lower lobe collapse and hypoxemia. After his diet was advanced on 2/23 he choked on food a few times (both solids and liquids).  Pertinent  Medical History  Colon cancer diagnosed in 2022, treated with transverse colectomy which was complicated and required prolonged ICU stay involving temporary hemodialysis, tracheostomy placement. 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 Developed delirium added HS haldol and added back ativan  2/28 Rough nights due to pain / discomfort.   Interim History / Subjective:  Afebrile  Reports hiccups but no nausea or vomiting. Ongoing pain.  On 1L [place]  Glucose range 90-110 I/O UOP 1.4L, NGT 800 ml, +158m in last 24 hours   Objective   Blood pressure (!) 144/87, pulse 82,  temperature 98.1 F (36.7 C), temperature source Oral, resp. rate 15, height '5\' 8"'$  (1.727 m), weight 88 kg, SpO2 95 %.        Intake/Output Summary (Last 24 hours) at 07/08/2022 1146 Last data filed at 07/08/2022 0700 Gross per 24 hour  Intake 1869.78 ml  Output 1800 ml  Net 69.78 ml   Filed Weights   07/06/22 0500 07/07/22 0500 07/08/22 0500  Weight: 93 kg 92.5 kg 88 kg    Exam: General: ill appearing adult male lying in bed in NAD HEENT: MM pink/moist, NGT in place, anicteric, pupils =/reactive  Neuro: Awake, alert / oriented to self/place/events, MAE but generalized weakness  CV: s1s2 RRR, SEM 2nd ICS LSB  PULM: non-labored at rest, lungs bilaterally clear GI: soft, bsx4 active, dressing intact clean/dry  Extremities: warm/dry, trace LE edema  Skin: no rashes or lesions  PCXR - small amt air under right hemidiaphragm, port in place, NGT    Resolved Hospital Problem list     Assessment & Plan:   Hypoxemia due to right lower lobe collapse Mucous plugging right lower lobe Dyphagia, aspiration event on 2/23 Completed zosyn -diet per CCS -aspiration precautions -push pulmonary hygiene -IS, flutter  -hypertonic saline neb  -wean O2 for sats >90% -follow intermittent CXR  -completed zosyn -TPN per pharmacy   Acute on chronic kidney disease CKD stage IIIb Hypokalemia, Hypophosphatemia  -continue D5LR at 50 ml/hr  -replace K, PHOS 2/29  -Trend BMP / urinary output -Replace electrolytes as indicated -Avoid nephrotoxic agents, ensure adequate renal perfusion  Status post lysis of adhesions colostomy takedown Ileus  -post op care per CCS > feeding, pain control, nutrition  -aggressive K+ replacement  with ileus  -follow abd film  -erythromycin & entereg per CCS -defer timing of CT imaging ABD to CCS  Hx Hypertension and h/o heart murmur  -PRN hydralazine / lopressor for SBP >160  -resume oral agents when ok with CCS  H/o DM -SSI, sensitive scale  Delirium.   Suspect sleep related/ICU  -QHS haldol  -OOB, delirium prevention measures  -frequent re-orientation  -assess EKG  -PRN ativan   History of stroke, left MCA 2015 Aspiration risk Seizure disorder -Keppra IV   Best Practice (right click and "Reselect all SmartList Selections" daily)  Per Primary  TRH to assume medical consult 3/1   Critical care time: n/a   Noe Gens, MSN, APRN, NP-C, AGACNP-BC [facility] 07/08/2022, 12:33 PM   Please see [email or web address] for pager details.   From 7A-7P if no response, please call [phone] After hours, please call ELink [phone]

## 2022-07-08 NOTE — Progress Notes (Signed)
SLP Cancellation Note  Patient Details Name: Thomas [name] MRN: [id number] DOB: 1956-03-11   Cancelled treatment:       Reason Eval/Treat Not Completed: Patient at procedure or test/unavailable   [name],M.S., CCC-SLP 07/08/2022, 3:02 PM

## 2022-07-09 ENCOUNTER — Inpatient Hospital Stay (HOSPITAL_COMMUNITY): Payer: Medicare PPO

## 2022-07-09 ENCOUNTER — Inpatient Hospital Stay: Payer: Self-pay

## 2022-07-09 ENCOUNTER — Inpatient Hospital Stay: Payer: Medicare PPO

## 2022-07-09 ENCOUNTER — Encounter (HOSPITAL_COMMUNITY): Payer: Self-pay | Admitting: Surgery

## 2022-07-09 DIAGNOSIS — N183 Chronic kidney disease, stage 3 unspecified: Secondary | ICD-10-CM

## 2022-07-09 DIAGNOSIS — Z85048 Personal history of other malignant neoplasm of rectum, rectosigmoid junction, and anus: Secondary | ICD-10-CM | POA: Diagnosis not present

## 2022-07-09 DIAGNOSIS — K219 Gastro-esophageal reflux disease without esophagitis: Secondary | ICD-10-CM

## 2022-07-09 LAB — COMPREHENSIVE METABOLIC PANEL
ALT: 22 U/L (ref 0–44)
AST: 22 U/L (ref 15–41)
Albumin: 2.6 g/dL — ABNORMAL LOW (ref 3.5–5.0)
Alkaline Phosphatase: 52 U/L (ref 38–126)
Anion gap: 7 (ref 5–15)
BUN: 18 mg/dL (ref 8–23)
CO2: 25 mmol/L (ref 22–32)
Calcium: 7.9 mg/dL — ABNORMAL LOW (ref 8.9–10.3)
Chloride: 110 mmol/L (ref 98–111)
Creatinine, Ser: 1.58 mg/dL — ABNORMAL HIGH (ref 0.61–1.24)
GFR, Estimated: 48 mL/min — ABNORMAL LOW (ref >60)
Glucose, Bld: 127 mg/dL — ABNORMAL HIGH (ref 70–99)
Potassium: 3.5 mmol/L (ref 3.5–5.1)
Sodium: 142 mmol/L (ref 135–145)
Total Bilirubin: 0.8 mg/dL (ref 0.3–1.2)
Total Protein: 6.6 g/dL (ref 6.5–8.1)

## 2022-07-09 LAB — GLUCOSE, CAPILLARY
Glucose-Capillary: 118 mg/dL — ABNORMAL HIGH (ref 70–99)
Glucose-Capillary: 128 mg/dL — ABNORMAL HIGH (ref 70–99)
Glucose-Capillary: 120 mg/dL — ABNORMAL HIGH (ref 70–99)
Glucose-Capillary: 130 mg/dL — ABNORMAL HIGH (ref 70–99)
Glucose-Capillary: 109 mg/dL — ABNORMAL HIGH (ref 70–99)

## 2022-07-09 LAB — MAGNESIUM: Magnesium: 1.8 mg/dL (ref 1.7–2.4)

## 2022-07-09 LAB — TRIGLYCERIDES: Triglycerides: 105 mg/dL (ref <150)

## 2022-07-09 LAB — CBC
HCT: 29.2 % — ABNORMAL LOW (ref 39.0–52.0)
Hemoglobin: 9.6 g/dL — ABNORMAL LOW (ref 13.0–17.0)
MCH: 25.9 pg — ABNORMAL LOW (ref 26.0–34.0)
MCHC: 32.9 g/dL (ref 30.0–36.0)
MCV: 78.9 fL — ABNORMAL LOW (ref 80.0–100.0)
Platelets: 328 10*3/uL (ref 150–400)
RBC: 3.7 MIL/uL — ABNORMAL LOW (ref 4.22–5.81)
RDW: 15 % (ref 11.5–15.5)
WBC: 16.4 10*3/uL — ABNORMAL HIGH (ref 4.0–10.5)
nRBC: 0 % (ref 0.0–0.2)

## 2022-07-09 LAB — PHOSPHORUS: Phosphorus: 3 mg/dL (ref 2.5–4.6)

## 2022-07-09 MED ORDER — IOHEXOL 300 MG/ML  SOLN: 100.0000 mL | Freq: Once | INTRAMUSCULAR | Status: DC | PRN

## 2022-07-09 MED ORDER — PIPERACILLIN-TAZOBACTAM 3.375 G IVPB
3.3750 g | Freq: Three times a day (TID) | INTRAVENOUS | Status: DC
  Administered 2022-07-10: 3.375 g via INTRAVENOUS
  Filled 2022-07-09: qty 50

## 2022-07-09 MED ORDER — TRAVASOL 10 % IV SOLN
INTRAVENOUS | Status: AC
  Filled 2022-07-09: qty 777.6

## 2022-07-09 MED ORDER — PANTOPRAZOLE SODIUM 40 MG IV SOLR
40.0000 mg | Freq: Two times a day (BID) | INTRAVENOUS | Status: DC
  Administered 2022-07-09 – 2022-07-10 (×3): 40 mg via INTRAVENOUS
  Filled 2022-07-09 (×3): qty 10

## 2022-07-09 MED ORDER — IOHEXOL 300 MG/ML  SOLN
100.0000 mL | Freq: Once | INTRAMUSCULAR | Status: AC | PRN
  Administered 2022-07-09: 100 mL via INTRATHECAL

## 2022-07-09 MED ORDER — HYDRALAZINE HCL 20 MG/ML IJ SOLN
10.0000 mg | INTRAMUSCULAR | Status: DC | PRN
  Administered 2022-07-09: 10 mg via INTRAVENOUS
  Filled 2022-07-09: qty 1

## 2022-07-09 MED ORDER — POTASSIUM CHLORIDE 10 MEQ/100ML IV SOLN
10.0000 meq | INTRAVENOUS | Status: AC
  Administered 2022-07-09 (×2): 10 meq via INTRAVENOUS
  Filled 2022-07-09 (×2): qty 100

## 2022-07-09 MED ORDER — MAGNESIUM SULFATE 2 GM/50ML IV SOLN
2.0000 g | Freq: Once | INTRAVENOUS | Status: AC
  Administered 2022-07-09: 2 g via INTRAVENOUS
  Filled 2022-07-09: qty 50

## 2022-07-09 MED ORDER — SODIUM CHLORIDE (PF) 0.9 % IJ SOLN
INTRAMUSCULAR | Status: AC
  Filled 2022-07-09: qty 50

## 2022-07-09 MED ORDER — POTASSIUM CHLORIDE 10 MEQ/50ML IV SOLN: 10.0000 meq | INTRAVENOUS | Status: DC

## 2022-07-09 MED ORDER — IOHEXOL 9 MG/ML PO SOLN
500.0000 mL | ORAL | Status: AC
  Administered 2022-07-09: 500 mL via ORAL

## 2022-07-09 MED ORDER — IOHEXOL 9 MG/ML PO SOLN
ORAL | Status: AC
  Filled 2022-07-09: qty 1000

## 2022-07-09 MED ORDER — INSULIN ASPART 100 UNIT/ML IJ SOLN
0.0000 [IU] | Freq: Four times a day (QID) | INTRAMUSCULAR | Status: AC
  Administered 2022-07-09: 1 [IU] via SUBCUTANEOUS
  Administered 2022-07-10: 2 [IU] via SUBCUTANEOUS
  Administered 2022-07-10: 1 [IU] via SUBCUTANEOUS

## 2022-07-09 MED ORDER — IOHEXOL 300 MG/ML  SOLN
100.0000 mL | Freq: Once | INTRAMUSCULAR | Status: AC | PRN
  Administered 2022-07-09: 100 mL via INTRAVENOUS

## 2022-07-09 NOTE — Progress Notes (Signed)
PHARMACY - TOTAL PARENTERAL NUTRITION CONSULT NOTE   Indication: Prolonged ileus  Patient Measurements: Height: '5\' 8"'$  (172.7 cm) Weight: 88.2 kg (194 lb 7.1 oz) IBW/kg (Calculated) : 68.4 TPN AdjBW (KG): 73.1 Body mass index is 29.57 kg/m. Usual Weight:   Assessment: 67 yo male with hx colon cancer diagnosed 2022 s/p transverse colectomy admitted for elective robotic ostomy takedown with lysis of adhesions, incarcerated incisional hernia repair on 07/01/22. Post-op course complicated by right lower lobe collapse and hypoxemia, transferred to stepdown on 07/03/22 for management.  Pharmacy consulted for TPN on 07/08/22 for prolonged ileus.  Glucose / Insulin: Hx DM2 A1c 5.1 (07/01/22) - CBG range 118-127, goal 100-180 - No SSI required. Electrolytes: All WNL, including CorrCa (9). K (3.5), Mg (1.8) is on lower end of normal Renal: CKDIII, SCr near baseline Hepatic: LFTs, TG, T.bili WNL, Alb low Intake / Output: -UOP: 1400 mL, NG output: 925 mL, stool: 350 mL + 2 unmeasured -Erythromycin discontinued. Reglan 5 mg IV q6h MIVF: NS @ KVO GI Imaging: - 2/27 AXR: Previous postoperative pneumoperitoneum is less distinct and may have resolved or at least partially resolved. -2/29 Abdominal: Moderate dilation of small bowel loops, suggesting ileus. Linear collection of air adjacent to the right hemidiaphragm. Possibility of pneumoperitoneum is not excluded.  -3/1 CT Abd: Ordered GI Surgeries / Procedures:  - 2/22: robotic colostomy takedown and lysis of adhesions  Central access: implanted port TPN start date: 2/29  Nutritional Goals: Goal TPN rate is 100  mL/hr (provides 130 g of protein and 2486  kcals per day)  RD Assessment: pending Estimated Needs Total Energy Estimated Needs: 2400-2600 kcals Total Protein Estimated Needs: 125-140 grams Total Fluid Estimated Needs: >/= 2.4L  Current Nutrition:  NPO TPN  Plan:  Now: Mg 2g IV once KCl 10 mEq x2 (10 mEq given this morning) =  total of 30 mEq Reduce CBG and SSI from q4h to q6h  At 1800: Increase TPN to 60 mL/hr at 1800 Electrolytes in TPN: Remove Ca Na 26mq/L, K 52m/L, Ca 58m158mL, Mg 5mE65m, and Phos 15mm44m.  Cl:Ac 1:1 Add standard MVI and trace elements to TPN Thiamine x 5 days (3/1 > 3/5) Continue sSSI q6h mIVF at K[facility]. [facility]ther management by MD. Monitor TPN labs on Mon/Thurs. Recheck electrolytes with AM labs tomorrow.   Shenoa Hattabaugh Lenis NoonrmD 07/09/22 7:56 AM

## 2022-07-09 NOTE — Evaluation (Signed)
SLP Cancellation Note  Patient Details Name: Thomas [name] MRN: [id number] DOB: [birth year]   Cancelled treatment:       Reason Eval/Treat Not Completed: Other (comment) (pt continues to have NG in place and wtih nausea/distended abdomen)  [name], MS [facility] SLP Acute Rehab Services Office [phone]  [name] 07/09/2022, 11:51 AM

## 2022-07-09 NOTE — Progress Notes (Addendum)
Thomas [name] [id number] 1955-10-12  CARE TEAM:  PCP: [name], NP  Outpatient Care Team: Patient Care Team: [name], NP as PCP - General Benito Mccreedy, MD (Internal Medicine) Patient, No Pcp Per (General Practice) Michael Boston, MD as Consulting Physician (Colon and Rectal Surgery) Irene Shipper, MD as Consulting Physician (Gastroenterology) Ladell Pier, MD as Consulting Physician (Oncology)  Inpatient Treatment Team: Treatment Team: Attending Provider: Michael Boston, MD; Rounding Team: Pccm, Md, MD; Registered Nurse: Benson Norway, RN; Technician: Renata Caprice, NT; Rounding Team: Garner Gavel, MD; Consulting Physician: Alma Friendly, MD; Physical Therapy Assistant: Diego Cory, PTA; Registered Nurse: Linward Headland, RN   Problem List:   Principal Problem:   History of colorectal cancer Active Problems:   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   History of ischemic left MCA stroke 2015   Type 2 diabetes mellitus with diabetic nephropathy, without long-term current use of insulin (HCC)   Hypertension   Colon cancer ([place])   Epilepsy ([place])   Hyperlipidemia   Gastroesophageal reflux disease without esophagitis   Delirium   Hypokalemia   CKD (chronic kidney disease) stage 3, GFR 30-59 ml/min (HCC)   Insomnia due to medical condition   Hypophosphatemia   Hiccoughs   8 Days Post-Op  07/01/2022  POST-OPERATIVE DIAGNOSIS:   COLOSTOMY FOR COLON RESECTION INCARCERATED INCISIONAL HERNIA   PROCEDURE:   TAKEDOWN OF END COLOSTOMY WITH ANASTOMOSIS INTRAOPERATIVE ASSESSMENT OF TISSUE VASCULAR PERFUSION USING ICG (indocyanine green) IMMUNOFLUORESCENCE TRANSVERSUS ABDOMINIS PLANE (TAP) BLOCK - BILATERAL LYSIS OF ADHESIONS x90 MINUTES (50% OF CASE) PRIMARY REPAIR OF INCARCERATED INCISIONAL HERNIA   SURGEON:  Adin Hector, MD  OR FINDINGS:    Patient had very dense intra-abdominal adhesions of  small bowel to all quadrants of the abdomen as well as colon.  He had a loop of small bowel incarcerated in a periumbilical midline incisional hernia 4 x 4 cm.  Reduced and primarily repaired.   No obvious metastatic disease on visceral parietal peritoneum or liver.   It is an isoperistaltic ileocolonic anastomosis (proximal transverse colon to distal transverse colon) that rests in the epigastric region.    Assessment  Postop ileus with aspiration pneumonitis.  [facility] Stay = 8 days)  Plan:  Patient mentally better and denies pain but more distended.  White count persistent.  I think I need to get a CAT scan with enteral contrast.  See if they can give it through the NG tube.  At the very least contrast enema.  Rule out delayed anastomotic leak or abscess/fluid collection.  IV contrast not definitely needed unless radiology feels otherwise.  Does have some kidney disease.  If there is abscess then plan percutaneous drainage.  If massive free or mass of leak then most likely will need operative exploration with probable diverting loop ileostomy proximal to the anastomosis as long as there is no major dehiscence or necrosis at the anastomosis.  We will see.  Continue scheduled metoclopramide and erythromycin today in hopes that helps ileus more consistently turnaround faster.  Will care aware but not feel metoclopramide is contributing to confusion or concerns.  Lower dose on metoclopramide for now.  NGT to LIWS for now  Some delirium with anxiety.  Challenge when he is off his usual Keppra, lorazepam, gabapentin.  Stepdown setting.  Defer to pulmonary critical care.  I do not know if Haldol and Ativan are great combination but we will have to see.  Done home  and in the drawing either.  Seems to be sundowning.  Check ammonia and LFTs since he had had that elevated in the past.  -I would not trust his digestive tract for any pills till he is tolerating liquids and NG tube is out.  TPN    Hypokalemia.  Correcting.    Hypophosphatemia.  Bleeding in the setting of TPN.  Chronic kidney disease but nonoliguric.  Removed with condom cath as needed and I&O as needed.  Has urinary retention despite 1 I&O, Place Foley on second I&O attempt.  IV antibiotics.  Zosyn 2/24 -   Current concern for some plugging.  Pulmonary critical care following.  Seems to improved with physical therapy before considering bronchoscopy.  Pulmonary status seems to be stable.   Some acute blood loss anemia in setting of chronic anemia of chronic disease and iron deficiency anemia.  Concerns by patient's wife and some blood noted and NG tube residual checks.  Noted that can happen.  Continue PPI.  Hemoglobin stable.  Not hypotensive.  Follow.  -VTE prophylaxis- SCDs, etc  -mobilize as tolerated to help recovery.  Actually got up yesterday the most ever.  Disposition:  Disposition:  The patient is from: Home  Anticipate discharge to:  [place] (SNF)  Anticipated Date of Discharge is:  March 8,2024    Barriers to discharge:  Pending Clinical improvement (more likely than not)  Patient currently is NOT MEDICALLY STABLE for discharge from the hospital from a surgery standpoint.   I updated the patient's status to the patient and spouse at bedside & ICU RNs just outside room.  Recommendations were made.  Questions were answered.  They expressed understanding & appreciation.    I reviewed nursing notes, last 24 h vitals and pain scores, last 48 h intake and output, last 24 h labs and trends, and last 24 h imaging results.  Reviewed notes with respiratory physical therapies as well as critical care nursing.  I have reviewed this patient's available data, including medical history, events of note, test results, etc as part of my evaluation.  A significant portion of that time was spent in counseling.  Care during the described time interval was provided by me.  This care required  moderate level of medical decision making.  07/09/2022    Subjective: (Chief complaint)  Patient able to get up in chair yesterday.  Not confused.  Apologized for being a little confused yesterday.  Joking.  Patient's wife in room.  ICU nursing just outside room.  Still with some persistent nausea and occasional hiccups.  Loose light brown bowel movements  Objective:  Vital signs:  Vitals:   07/09/22 0451 07/09/22 0500 07/09/22 0600 07/09/22 0700  BP: (!) 178/77 (!) 178/74 (!) 162/68 (!) 146/83  Pulse:      Resp: (!) '21 15 18 16  '$ Temp:      TempSrc:      SpO2:  100% 100%   Weight:      Height:        Last BM Date : 07/08/22  Intake/Output   Yesterday:  02/29 0701 - 03/01 0700 In: 2284.3 [P.O.:50; I.V.:1199; IV Piggyback:1035.2] Out: 2675 [Urine:1400; Emesis/NG output:925; Stool:350] This shift:  No intake/output data recorded.  Bowel function:  Flatus: No  BM:  No  Drain:  Nasogastric tube with lightly bilious tinged effluent.  Rather thin.   Physical Exam:  General: Pt awake/alert in no acute distress.  Tired but alert and joking.   Eyes: PERRL,  normal EOM.  Sclera clear.  No icterus Neuro: CN II-XII intact w/o focal sensory/motor deficits. Lymph: No head/neck/groin lymphadenopathy Psych:  No delerium/psychosis/paranoia.  Oriented x 4.  No confusion or anxiety today. HENT: Normocephalic, Mucus membranes moist.  No thrush Neck: Supple, No tracheal deviation.  No obvious thyromegaly Chest: No pain to chest wall compression.  Good respiratory excursion.  No audible wheezing CV:  Pulses intact.  Regular rhythm.  No major extremity edema MS: Normal AROM mjr joints.  No obvious deformity  Abdomen: Soft.  Moderately distended.  Nontender .  Right upper quadrant old colostomy wound closed and dry.  No purulence or cellulitis.  No guarding nor evidence of peritonitis.  No incarcerated hernias.  Ext:   No deformity.  No mjr edema.  No cyanosis Skin: No  petechiae / purpurea.  No major sores.  Warm and dry    Results:   Cultures: Recent Results (from the past 720 hour(s))  MRSA Next Gen by PCR, Nasal     Status: None   Collection Time: 07/03/22  3:16 PM   Specimen: Nasal Mucosa; Nasal Swab  Result Value Ref Range Status   MRSA by PCR Next Gen NOT DETECTED NOT DETECTED Final    Comment: (NOTE) The GeneXpert MRSA Assay (FDA approved for NASAL specimens only), is one component of a comprehensive MRSA colonization surveillance program. It is not intended to diagnose MRSA infection nor to guide or monitor treatment for MRSA infections. Test performance is not FDA approved in patients less than 27 years old. Performed at [facility], [place] [address] [place], [place] [address]     Labs: Results for orders placed or performed during the hospital encounter of 07/01/22 (from the past 48 hour(s))  Glucose, capillary     Status: Abnormal   Collection Time: 07/07/22  8:10 AM  Result Value Ref Range   Glucose-Capillary 110 (H) 70 - 99 mg/dL    Comment: Glucose reference range applies only to samples taken after fasting for at least 8 hours.   Comment 1 Notify RN    Comment 2 Document in Chart   Ammonia     Status: None   Collection Time: 07/07/22 10:06 AM  Result Value Ref Range   Ammonia 17 9 - 35 umol/L    Comment: Performed at [facility], [place] [address], [place], [place] [address]  Hepatic function panel     Status: Abnormal   Collection Time: 07/07/22 10:06 AM  Result Value Ref Range   Total Protein 5.8 (L) 6.5 - 8.1 g/dL   Albumin 2.5 (L) 3.5 - 5.0 g/dL   AST 22 15 - 41 U/L   ALT 19 0 - 44 U/L   Alkaline Phosphatase 54 38 - 126 U/L   Total Bilirubin 0.9 0.3 - 1.2 mg/dL   Bilirubin, Direct 0.2 0.0 - 0.2 mg/dL   Indirect Bilirubin 0.7 0.3 - 0.9 mg/dL    Comment: Performed at [facility], [place] [address] [place], [place] [address]  Glucose, capillary     Status:  Abnormal   Collection Time: 07/07/22 11:18 AM  Result Value Ref Range   Glucose-Capillary 116 (H) 70 - 99 mg/dL    Comment: Glucose reference range applies only to samples taken after fasting for at least 8 hours.   Comment 1 Notify RN    Comment 2 Document in Chart   Glucose, capillary     Status: Abnormal   Collection Time: 07/07/22  3:57  PM  Result Value Ref Range   Glucose-Capillary 117 (H) 70 - 99 mg/dL    Comment: Glucose reference range applies only to samples taken after fasting for at least 8 hours.   Comment 1 Notify RN    Comment 2 Document in Chart   Glucose, capillary     Status: None   Collection Time: 07/07/22  7:57 PM  Result Value Ref Range   Glucose-Capillary 96 70 - 99 mg/dL    Comment: Glucose reference range applies only to samples taken after fasting for at least 8 hours.  Glucose, capillary     Status: None   Collection Time: 07/08/22 12:14 AM  Result Value Ref Range   Glucose-Capillary 90 70 - 99 mg/dL    Comment: Glucose reference range applies only to samples taken after fasting for at least 8 hours.  Basic metabolic panel     Status: Abnormal   Collection Time: 07/08/22  2:59 AM  Result Value Ref Range   Sodium 139 135 - 145 mmol/L   Potassium 3.4 (L) 3.5 - 5.1 mmol/L   Chloride 108 98 - 111 mmol/L   CO2 26 22 - 32 mmol/L   Glucose, Bld 110 (H) 70 - 99 mg/dL    Comment: Glucose reference range applies only to samples taken after fasting for at least 8 hours.   BUN 16 8 - 23 mg/dL   Creatinine, Ser 1.53 (H) 0.61 - 1.24 mg/dL   Calcium 7.8 (L) 8.9 - 10.3 mg/dL   GFR, Estimated 50 (L) >60 mL/min    Comment: (NOTE) Calculated using the CKD-EPI Creatinine Equation (2021)    Anion gap 5 5 - 15    Comment: Performed at [facility], [place] [address], [place], [place] [address]  Magnesium     Status: None   Collection Time: 07/08/22  2:59 AM  Result Value Ref Range   Magnesium 2.0 1.7 - 2.4 mg/dL    Comment: Performed at [facility], [place] [address] [place], [place] [address]  CBC     Status: Abnormal   Collection Time: 07/08/22  2:59 AM  Result Value Ref Range   WBC 16.0 (H) 4.0 - 10.5 K/uL   RBC 3.31 (L) 4.22 - 5.81 MIL/uL   Hemoglobin 8.7 (L) 13.0 - 17.0 g/dL    Comment: Reticulocyte Hemoglobin testing may be clinically indicated, consider ordering this additional test [id number]    HCT 25.4 (L) 39.0 - 52.0 %   MCV 76.7 (L) 80.0 - 100.0 fL   MCH 26.3 26.0 - 34.0 pg   MCHC 34.3 30.0 - 36.0 g/dL   RDW 14.8 11.5 - 15.5 %   Platelets 301 150 - 400 K/uL   nRBC 0.0 0.0 - 0.2 %    Comment: Performed at [facility], [place] [address] [place], [place] [address]  Phosphorus     Status: Abnormal   Collection Time: 07/08/22  2:59 AM  Result Value Ref Range   Phosphorus 1.2 (L) 2.5 - 4.6 mg/dL    Comment: Performed at [facility], [place] [address] [place],  [address]  Glucose, capillary     Status: Abnormal   Collection Time: 07/08/22  4:07 AM  Result Value Ref Range   Glucose-Capillary 105 (H) 70 - 99 mg/dL    Comment: Glucose reference range applies only to samples taken after fasting for at least 8 hours.  Glucose, capillary     Status: Abnormal  Collection Time: 07/08/22  8:28 AM  Result Value Ref Range   Glucose-Capillary 107 (H) 70 - 99 mg/dL    Comment: Glucose reference range applies only to samples taken after fasting for at least 8 hours.   Comment 1 Notify RN    Comment 2 Document in Chart   Glucose, capillary     Status: Abnormal   Collection Time: 07/08/22 12:12 PM  Result Value Ref Range   Glucose-Capillary 110 (H) 70 - 99 mg/dL    Comment: Glucose reference range applies only to samples taken after fasting for at least 8 hours.   Comment 1 Notify RN    Comment 2 Document in Chart   Glucose, capillary     Status: None   Collection Time: 07/08/22  4:22 PM  Result Value Ref Range   Glucose-Capillary 95 70 - 99 mg/dL    Comment:  Glucose reference range applies only to samples taken after fasting for at least 8 hours.   Comment 1 Notify RN    Comment 2 Document in Chart   Phosphorus     Status: None   Collection Time: 07/08/22  8:29 PM  Result Value Ref Range   Phosphorus 2.7 2.5 - 4.6 mg/dL    Comment: Performed at [facility], [place] [address], [place], [place] [address]  Glucose, capillary     Status: Abnormal   Collection Time: 07/08/22  8:34 PM  Result Value Ref Range   Glucose-Capillary 116 (H) 70 - 99 mg/dL    Comment: Glucose reference range applies only to samples taken after fasting for at least 8 hours.  Glucose, capillary     Status: Abnormal   Collection Time: 07/08/22 11:57 PM  Result Value Ref Range   Glucose-Capillary 122 (H) 70 - 99 mg/dL    Comment: Glucose reference range applies only to samples taken after fasting for at least 8 hours.  Comprehensive metabolic panel     Status: Abnormal   Collection Time: 07/09/22  3:39 AM  Result Value Ref Range   Sodium 142 135 - 145 mmol/L   Potassium 3.5 3.5 - 5.1 mmol/L   Chloride 110 98 - 111 mmol/L   CO2 25 22 - 32 mmol/L   Glucose, Bld 127 (H) 70 - 99 mg/dL    Comment: Glucose reference range applies only to samples taken after fasting for at least 8 hours.   BUN 18 8 - 23 mg/dL   Creatinine, Ser 1.58 (H) 0.61 - 1.24 mg/dL   Calcium 7.9 (L) 8.9 - 10.3 mg/dL   Total Protein 6.6 6.5 - 8.1 g/dL   Albumin 2.6 (L) 3.5 - 5.0 g/dL   AST 22 15 - 41 U/L   ALT 22 0 - 44 U/L   Alkaline Phosphatase 52 38 - 126 U/L   Total Bilirubin 0.8 0.3 - 1.2 mg/dL   GFR, Estimated 48 (L) >60 mL/min    Comment: (NOTE) Calculated using the CKD-EPI Creatinine Equation (2021)    Anion gap 7 5 - 15    Comment: Performed at [facility], [place] [address] [place], [place] [address]  Magnesium     Status: None   Collection Time: 07/09/22  3:39 AM  Result Value Ref Range   Magnesium 1.8 1.7 - 2.4 mg/dL    Comment: Performed at [facility], [place] [address], [place], [place] [address]  Phosphorus     Status: None   Collection Time: 07/09/22  3:39 AM  Result Value Ref Range   Phosphorus 3.0 2.5 - 4.6 mg/dL    Comment: Performed at [facility], [place] [address], [place], [place] [address]  Triglycerides     Status: None   Collection Time: 07/09/22  3:39 AM  Result Value Ref Range   Triglycerides 105 <150 mg/dL    Comment: Performed at [facility], [place] [address], [place], [place] [address]  CBC     Status: Abnormal   Collection Time: 07/09/22  3:39 AM  Result Value Ref Range   WBC 16.4 (H) 4.0 - 10.5 K/uL   RBC 3.70 (L) 4.22 - 5.81 MIL/uL   Hemoglobin 9.6 (L) 13.0 - 17.0 g/dL   HCT 29.2 (L) 39.0 - 52.0 %   MCV 78.9 (L) 80.0 - 100.0 fL   MCH 25.9 (L) 26.0 - 34.0 pg   MCHC 32.9 30.0 - 36.0 g/dL   RDW 15.0 11.5 - 15.5 %   Platelets 328 150 - 400 K/uL   nRBC 0.0 0.0 - 0.2 %    Comment: Performed at [facility], [place] [address] [place], [place] [address]  Glucose, capillary     Status: Abnormal   Collection Time: 07/09/22  4:17 AM  Result Value Ref Range   Glucose-Capillary 118 (H) 70 - 99 mg/dL    Comment: Glucose reference range applies only to samples taken after fasting for at least 8 hours.    Imaging / Studies: DG Abd 2 Views  Addendum Date: 07/08/2022   ADDENDUM REPORT: 07/08/2022 10:05 ADDENDUM: Imaging findings were discussed with Dr. Johney Maine. Patient apparently had recent abdominal surgery which may account for pneumoperitoneum. Electronically Signed   By: Elmer Picker M.D.   On: 07/08/2022 10:05   Result Date: 07/08/2022 CLINICAL DATA:  Abdominal distention, ileus EXAM: ABDOMEN - 2 VIEW COMPARISON:  07/06/2022 FINDINGS: There is mild to moderate dilation of small-bowel loops suggesting ileus. Gas is present in colon. Stomach is not distended. Tip of enteric tube is seen in the region of body of stomach. In the upright view,  there is curvilinear air density adjacent to the right hemidiaphragm. Visualized lower lung fields are clear. IMPRESSION: There is linear collection of air adjacent to the right hemidiaphragm. Possibility of pneumoperitoneum is not excluded. This may suggest bowel perforation or recent surgical intervention in abdomen. Please correlate with clinical history and physical examination findings. There is mild to moderate dilation of small-bowel loops suggesting possible ileus. Tip of enteric tube is seen in the region of body of the stomach. We are attempting to contact the patient's provider. I talked with patient's nurse Eliezer Lofts by telephone call and she is trying to reach the provider. I left my telephone number with Jenna. Also, these results will be called to the ordering clinician or representative by the Radiologist Assistant, and communication documented in the PACS or [employer]. Electronically Signed: By: Elmer Picker M.D. On: 07/08/2022 09:27   DG Chest Port 1 View  Result Date: 07/08/2022 CLINICAL DATA:  Pneumonitis EXAM: PORTABLE CHEST 1 VIEW COMPARISON:  Radiograph 07/05/2022 FINDINGS: Chest port catheter tip overlies the right atrium. Nasogastric tube tip overlies the stomach. Unchanged cardiomediastinal silhouette. There is free intraperitoneal gas under the right hemidiaphragm, which is a known finding. Improved aeration of the right lower lung with persistent trace pleural effusions. No evidence of pneumothorax. Bones are unchanged. IMPRESSION: Improved aeration of the right lower lobe with persistent trace pleural effusions Persistent postoperative pneumoperitoneum. Electronically Signed  By: Maurine Simmering M.D.   On: 07/08/2022 09:09    Medications / Allergies: per chart  Antibiotics: Anti-infectives (From admission, onward)    Start     Dose/Rate Route Frequency Ordered Stop   07/08/22 1100  piperacillin-tazobactam (ZOSYN) IVPB 3.375 g        3.375 g 12.5 mL/hr over 240  Minutes Intravenous Every 8 hours 07/08/22 1001 07/13/22 1059   07/08/22 1000  erythromycin 250 mg in sodium chloride 0.9 % 100 mL IVPB        250 mg 100 mL/hr over 60 Minutes Intravenous Every 12 hours 07/08/22 0739     07/06/22 0915  erythromycin 250 mg in sodium chloride 0.9 % 100 mL IVPB  Status:  Discontinued        250 mg 100 mL/hr over 60 Minutes Intravenous Every 8 hours 07/06/22 0823 07/08/22 0739   07/03/22 1700  piperacillin-tazobactam (ZOSYN) IVPB 3.375 g        3.375 g 12.5 mL/hr over 240 Minutes Intravenous Every 8 hours 07/03/22 1612 07/08/22 0450   07/02/22 0000  cefoTEtan (CEFOTAN) 2 g in sodium chloride 0.9 % 100 mL IVPB        2 g 200 mL/hr over 30 Minutes Intravenous Every 12 hours 07/01/22 1737 07/02/22 0051   07/01/22 1400  neomycin (MYCIFRADIN) tablet 1,000 mg  Status:  Discontinued       See Hyperspace for full Linked Orders Report.   1,000 mg Oral 3 times per day 07/01/22 1054 07/01/22 1109   07/01/22 1400  metroNIDAZOLE (FLAGYL) tablet 1,000 mg  Status:  Discontinued       See Hyperspace for full Linked Orders Report.   1,000 mg Oral 3 times per day 07/01/22 1054 07/01/22 1109   07/01/22 1100  cefoTEtan (CEFOTAN) 2 g in sodium chloride 0.9 % 100 mL IVPB        2 g 200 mL/hr over 30 Minutes Intravenous On call to O.R. 07/01/22 1054 07/01/22 1204         Note: Portions of this report may have been transcribed using voice recognition software. Every effort was made to ensure accuracy; however, inadvertent computerized transcription errors may be present.   Any transcriptional errors that result from this process are unintentional.    Adin Hector, MD, FACS, MASCRS Esophageal, Gastrointestinal & Colorectal Surgery Robotic and Minimally Invasive Surgery  [facility]. [address] [place], [place] [address] [phone] Fax [phone] Main  CONTACT INFORMATION:  Weekday (9AM-5PM):  Call [facility] main office at [phone]  Weeknight (5PM-9AM) or Weekend/Holiday: Check [email or web address] (password " [email or web address]") for General Surgery [facility] coverage  (Please, do not use SecureChat as it is not reliable communication to reach operating surgeons for immediate patient care given surgeries/outpatient duties/clinic/cross-coverage/off post-call which would lead to a delay in care.  Epic staff messaging available for outptient concerns, but may not be answered for 48 hours or more).     07/09/2022  7:10 AM

## 2022-07-09 NOTE — Progress Notes (Signed)
eLink Physician-Brief Progress Note Patient Name: Thomas [name] DOB: 15-Dec-1955 MRN: [id number]   Date of Service  07/09/2022  HPI/Events of Note  Patient with sub-optimal blood pressure control.  eICU Interventions  PRN iv Hydralazine ordered.        Frederik Pear 07/09/2022, 4:33 AM

## 2022-07-09 NOTE — Progress Notes (Incomplete)
Nutrition Follow-up  DOCUMENTATION CODES:   Not applicable  INTERVENTION:  ***   NUTRITION DIAGNOSIS:   Inadequate oral intake related to inability to eat (due to ileus) as evidenced by NPO status (starting TPN).  ***  GOAL:   Patient will meet greater than or equal to 90% of their needs  ***  MONITOR:   Diet advancement, Labs, Weight trends  REASON FOR ASSESSMENT:   Consult New TPN/TNA  ASSESSMENT:   67 year old male with history of transverse colon mass s/p transverse colectomy, DM2, who was admitted for an elective robotic ostomy takedown with lysis of adhesions on 07/01/22. Postoperatively noted to have right lower lobe collapse and hypoxemia as well as ileus.  ***   NUTRITION - FOCUSED PHYSICAL EXAM:  Flowsheet Row Most Recent Value  Orbital Region Moderate depletion  Upper Arm Region Mild depletion  Thoracic and Lumbar Region Mild depletion  Buccal Region Moderate depletion  Temple Region Moderate depletion  Clavicle Bone Region Mild depletion  Clavicle and Acromion Bone Region Moderate depletion  Scapular Bone Region Unable to assess  Dorsal Hand Mild depletion  Patellar Region Mild depletion  Anterior Thigh Region Mild depletion  Posterior Calf Region Mild depletion  Edema (RD Assessment) None  Hair Reviewed  Eyes Reviewed  Mouth Reviewed  Skin Reviewed  Nails Reviewed       Diet Order:   Diet Order             Diet clear liquid Room service appropriate? Yes; Fluid consistency: Thin  Diet effective now           Diet - low sodium heart healthy                   EDUCATION NEEDS:   Education needs have been addressed  Skin:  Skin Assessment: Skin Integrity Issues: Skin Integrity Issues:: Incisions Incisions: R Abdomen  Last BM:  2/29  Height:   Ht Readings from Last 1 Encounters:  07/01/22 '5\' 8"'$  (1.727 m)    Weight:   Wt Readings from Last 1 Encounters:  07/09/22 88.2 kg    Ideal Body Weight:     BMI:  Body mass  index is 29.57 kg/m.  Estimated Nutritional Needs:   Kcal:  2400-2600 kcals  Protein:  125-140 grams  Fluid:  >/= 2.4L    ***

## 2022-07-09 NOTE — Progress Notes (Signed)
Nutrition Follow-up  DOCUMENTATION CODES:   Non-severe (moderate) malnutrition in context of chronic illness  INTERVENTION:   - TPN to increased tonight to 80m/hr per pharmacy. This provides 78g protein (0.9g/kg) , 216g dextrose (GIR 1.'7mg'$ /kg/min), 446 IL kcals (30% of kcals) = 1491 kcal (17 kcal/kg)   - TPN management per pharmacy.    - Monitor magnesium, potassium, and phosphorus BID for at least 3 days, MD to replete as needed, as pt is at risk for refeeding syndrome given minimal/no intake x7 days. - Recommend '100mg'$  thiamine x5 days to support macronutrient metabolism.   - Clear Liquid diet.   - Monitor weight trends.   NUTRITION DIAGNOSIS:   Moderate Malnutrition related to chronic illness (transverse colon mass) as evidenced by moderate fat depletion, moderate muscle depletion. *new  GOAL:   Patient will meet greater than or equal to 90% of their needs *progressing with TPN   MONITOR:   Diet advancement, Labs, Weight trends, PO intake  REASON FOR ASSESSMENT:   Consult New TPN/TNA  ASSESSMENT:   67year old male with history of transverse colon mass s/p transverse colectomy, DM2, who was admitted for an elective robotic ostomy takedown with lysis of adhesions on 07/01/22. Postoperatively noted to have right lower lobe collapse and hypoxemia as well as ileus.  2/22 takedown of end colostomy and anastomosis, lysis of adhesions, and repair of incarcerated incisional hernia 2/23 advanced to DYS 1 diet 2/24 pt vomited, NG placed->LIS 2/28 CL diet 2/29 DYS 1 diet, starting TPN  Patient reports feeling better today. Pain and discomfort much improved today. Still having a lot of hiccups. NG remains in place. Still not taking in any PO.  Patient reports the plan today is for contrast administration via NG for CAT scan.   Discussed plan to increase TPN this evening, still monitoring electrolytes. Patient endorsed understanding. Patient agreeable to have NFPE done  today. Found to have mild/moderate fat and muscle wasting.  Medications reviewed and include: Dulcolax, Insulin, Reglan, Zosyn  Labs reviewed:  Creatinine 1.58 Triglycerides 105   NUTRITION - FOCUSED PHYSICAL EXAM:  Flowsheet Row Most Recent Value  Orbital Region Moderate depletion  Upper Arm Region Mild depletion  Thoracic and Lumbar Region Mild depletion  Buccal Region Moderate depletion  Temple Region Moderate depletion  Clavicle Bone Region Mild depletion  Clavicle and Acromion Bone Region Moderate depletion  Scapular Bone Region Unable to assess  Dorsal Hand Mild depletion  Patellar Region Mild depletion  Anterior Thigh Region Mild depletion  Posterior Calf Region Mild depletion  Edema (RD Assessment) None  Hair Reviewed  Eyes Reviewed  Mouth Reviewed  Skin Reviewed  Nails Reviewed       Diet Order:   Diet Order             Diet clear liquid Room service appropriate? Yes; Fluid consistency: Thin; Fluid restriction: 1200 mL Fluid  Diet effective now           Diet - low sodium heart healthy                   EDUCATION NEEDS:  Education needs have been addressed  Skin:  Skin Assessment: Reviewed RN Assessment Skin Integrity Issues:: Incisions Incisions: R Abdomen  Last BM:  3/1  Height:  Ht Readings from Last 1 Encounters:  07/01/22 '5\' 8"'$  (1.727 m)   Weight:  Wt Readings from Last 1 Encounters:  07/09/22 88.2 kg    BMI:  Body mass index is 29.57 kg/m.  Estimated  Nutritional Needs:  Kcal:  2400-2600 kcals Protein:  125-140 grams Fluid:  >/= 2.4L    Samson Frederic RD, LDN For contact information, refer to [facility].

## 2022-07-09 NOTE — Progress Notes (Signed)
PIV consult: pt receiving TPN via port, reports having multiple venipuncture for labs. Second PIV just started. Please consider PICC if requiring prolonged TPN/ infusions to reduce venipuncture and risk of CLABSI.

## 2022-07-09 NOTE — Consult Note (Signed)
Initial Consultation Note   Patient: Thomas [name] [id number] DOB: 1956-01-29 PCP: [name], NP DOA: 07/01/2022 DOS: the patient was seen and examined on 07/09/2022 Primary service: Michael Boston, MD   Reason for consult: Management of medical comorbidity   Assessment/Plan: S/p lysis of adhesions colostomy takedown Ileus Further management as per general surgery who is the primary attending  Acute hypoxic respiratory failure likely 2/2 mucous plugging right lower lobe with collapse- Resolved Noted dysphagia with aspiration events on 2/23 Currently now on room air, saturating well Continue clear liquid diet, TPN, no oral meds for now as per general surgery Pulmonary hygiene, incentive spirometer/flutter  Leukocytosis Currently afebrile ??  Abdominal source, chest x-ray unremarkable Repeat CT abdomen/pelvis pending as per general surgery  CKD stage IIIb Creatinine now at baseline Daily BMP  History of hypertension BP somewhat stable Continue IV hydralazine as needed until ok by surgery for p.o. meds As needed IV labetalol if BP not controlled  Diabetes mellitus type 2 Last A1c 5.1 Diet, Accu-Cheks, hypoglycemic protocol  History of CVA History of brain mass Seizure disorder Continue IV Keppra  GERD Continue IV PPI  Noted possible hospital-acquired delirium Continue nightly Haldol, as needed Ativan Delirium precautions    [facility] will continue to follow the patient.    HPI: [name] is a 67 y.o. male with past medical history of CKD stage IIIb, diabetes mellitus, hypertension, hyperlipidemia, colon cancer diagnosed in 2022, dysphagia, brain tumor during 2018, acute ischemic left MCA stroke in 2015, history of seizure disorder presents with a complex surgical history was admitted for an elective robotic ostomy takedown with lysis of adhesions on February 22, postoperatively noted to have right lower lobe collapse and hypoxemia. Moved to stepdown on February  24 for management and has remained in stepdown due to associated complications.  Has been followed by PCCM while in stepdown.  PCCM is stabilized his respiratory state and now currently on room air.  [facility] hospitalist consulted to assist in managing his other medical comorbidities.    Today, patient reports some nausea, with persistent hiccups.  Denies any significantly worsening abdominal pain, fever/chills, chest pain, worsening shortness of breath.     Review of Systems: As mentioned in the history of present illness. All other systems reviewed and are negative.   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   Dizziness    Epilepsy ([place])    takes Keppra daily   GERD (gastroesophageal reflux disease)    takes Omeprazole daily   Headache    Heart murmur    Hyperlipidemia    takes Atorvastatin daily   Hypertension    takes Lotrel daily   Peripheral edema    takes Lasix daily   Peripheral neuropathy    takes Gabapentin daily   Seizures ([place])    Sleep apnea    no cpap   Urinary frequency    Past Surgical History:  Procedure Laterality Date   BIOPSY  09/05/2020   Procedure: BIOPSY;  Surgeon: Irene Shipper, MD;  Location: [facility] ENDOSCOPY;  Service: Endoscopy;;   BIOPSY  02/06/2021   Procedure: BIOPSY;  Surgeon: Jerene Bears, MD;  Location: [facility] ENDOSCOPY;  Service: Gastroenterology;;   CARDIAC CATHETERIZATION  7 yrs ago   COLONOSCOPY WITH PROPOFOL N/A 09/05/2020   Procedure: COLONOSCOPY WITH PROPOFOL;  Surgeon: Irene Shipper, MD;  Location: [facility] ENDOSCOPY;  Service: Endoscopy;  Laterality: N/A;   CRANIOTOMY N/A  09/01/2016   Procedure: CRANIOTOMY TUMOR  LEFT PTERIONAL;  Surgeon: Ashok Pall, MD;  Location: [place];  Service: Neurosurgery;  Laterality: N/A;   cyst removed from chest      as a child   ESOPHAGOGASTRODUODENOSCOPY (EGD) WITH PROPOFOL N/A 02/06/2021   Procedure: ESOPHAGOGASTRODUODENOSCOPY (EGD) WITH PROPOFOL;  Surgeon: Jerene Bears, MD;  Location: [facility]COPY;  Service: Gastroenterology;  Laterality: N/A;   INGUINAL HERNIA REPAIR N/A 07/01/2022   Procedure: HERNIA REPAIR INGUINAL INCARCERATED;  Surgeon: Michael Boston, MD;  Location: [facility] ORS;  Service: General;  Laterality: N/A;   IR FLUORO GUIDE CV LINE LEFT  12/22/2020   IR GASTROSTOMY TUBE MOD SED  02/10/2021   IR GASTROSTOMY TUBE REMOVAL  04/15/2021   IR REMOVAL TUN CV CATH W/O FL  03/09/2021   IR US GUIDE VASC ACCESS LEFT  12/22/2020   LYSIS OF ADHESION N/A 07/01/2022   Procedure: LYSIS OF ADHESION;  Surgeon: Michael Boston, MD;  Location: [facility];  Service: General;  Laterality: N/A;   NO PAST SURGERIES     PARTIAL COLECTOMY N/A 09/10/2020   Procedure: TRANSVERSE COLECTOMY;  Surgeon: [name], MD;  Location: [place];  Service: General;  Laterality: N/A;   POLYPECTOMY  09/05/2020   Procedure: POLYPECTOMY;  Surgeon: Irene Shipper, MD;  Location: [place];  Service: Endoscopy;;   PORTACATH PLACEMENT Right 10/01/2020   Procedure: INSERTION PORT-A-CATH;  Surgeon: [name], MD;  Location: [place];  Service: General;  Laterality: Right;   SUBMUCOSAL TATTOO INJECTION  09/05/2020   Procedure: SUBMUCOSAL TATTOO INJECTION;  Surgeon: Irene Shipper, MD;  Location: [place];  Service: Endoscopy;;   TRACHEOSTOMY TUBE PLACEMENT N/A 01/05/2021   Procedure: TRACHEOSTOMY;  Surgeon: [name], MD;  Location: [place];  Service: ENT;  Laterality: N/A;   XI ROBOTIC ASSISTED COLOSTOMY TAKEDOWN N/A 07/01/2022   Procedure: ROBOTIC OSTOMY TAKEDOWN WITH INTRACORPOREAL ANASTOMOSIS WITH FIREFLY;  Surgeon: Michael Boston, MD;  Location: [facility];  Service:  General;  Laterality: N/A;   Social History:  reports that he has quit smoking. He has never used smokeless tobacco. He reports that he does not drink alcohol and does not use drugs.  Allergies  Allergen Reactions   Morphine And Related Other (See Comments)    Throat swelling (can tolerate oxycodone/hydrocodone/hydromorphone)    Family History  Problem Relation Age of Onset   Breast cancer Mother    Breast cancer Maternal Aunt    Prostate cancer Maternal Uncle    Colon cancer Neg Hx    Esophageal cancer Neg Hx    Rectal cancer Neg Hx    Stomach cancer Neg Hx     Prior to Admission medications   Medication Sig Start Date End Date Taking? Authorizing Provider  acetaminophen (TYLENOL) 500 MG tablet Take 500-1,000 mg by mouth every 6 (six) hours as needed (pain.).   Yes [provider]  allopurinol (ZYLOPRIM) 100 MG tablet Take 100 mg by mouth at bedtime. 02/15/22  Yes [provider]  amLODipine (NORVASC) 10 MG tablet Take 10 mg by mouth in the morning. 05/11/20  Yes [provider]  atorvastatin (LIPITOR) 20 MG tablet Take 20 mg by mouth in the morning.   Yes [provider]  Emollient (UDDERLY SMOOTH EX) Apply 1 Application topically as needed (dry/irritated skin).   Yes [provider]  gabapentin (NEURONTIN) 100 MG capsule Take 100-200 mg by mouth See admin instructions. Take 1 capsule (100 mg) by mouth in the morning & take 2 capsules (200 mg) by mouth in the evening.   Yes [provider]  levETIRAcetam (KEPPRA) 500 MG tablet Take 500 mg by mouth 2 (two) times daily. 08/27/21  Yes [provider]  LORazepam (ATIVAN) 0.5 MG tablet Take 0.5 mg by mouth 2 (two) times daily as needed for anxiety.   Yes [provider]  omeprazole (PRILOSEC) 20 MG capsule Take 20 mg by mouth in the morning and at bedtime.   Yes [provider]  traMADol (ULTRAM) 50 MG tablet Take 1-2 tablets (50-100 mg total) by mouth every 6  (six) hours as needed for moderate pain or severe pain. 07/01/22  Yes Michael Boston, MD  loperamide (IMODIUM) 2 MG capsule Take 1 capsule (2 mg total) by mouth as needed for diarrhea or loose stools. 03/11/21   Samella Parr, NP    Physical Exam: Vitals:   07/09/22 1000 07/09/22 1100 07/09/22 1121 07/09/22 1200  BP: (!) 166/57 (!) 146/83  (!) 159/107  Pulse: (!) 42 82  (!) 41  Resp: (!) 25 18  (!) 23  Temp:   98.8 F (37.1 C)   TempSrc:   Oral   SpO2: 98% 100%  100%  Weight:      Height:       General: NAD, chronically ill-appearing Cardiovascular: S1, S2 present Respiratory: CTAB Abdomen: Soft, +tender, +distended, bowel sounds present, dressing C/D/I Musculoskeletal: No bilateral pedal edema noted Skin: Normal Psychiatry: Normal mood    Data Reviewed:  As mentioned above     Family Communication: None at bedside    Thank you very much for involving [place] in the care of your patient.  Author: Alma Friendly, MD 07/09/2022 1:52 PM  For on call review [email or web address].

## 2022-07-09 NOTE — Progress Notes (Signed)
PT Cancellation Note  Patient Details Name: Thomas [name] MRN: [id number] DOB: 31-Jan-1956   Cancelled Treatment:     Holf off on PT today, per RN.  Increased ABD distention as well as nausea.  ABD CT ordered.  Will attempt to see another day as schedule permits.  Rica Koyanagi  PTA Acute  Rehabilitation Services Office M-F          [phone] Weekend pager [phone]

## 2022-07-09 NOTE — Progress Notes (Signed)
Was able to give patient one bottle of oral contrast w/o vomiting, nausea medications given during administration. I do not think patient would be able to tolerate second bottle of contrast at this time. Radiology notified, planned CT at 1330.

## 2022-07-09 NOTE — Progress Notes (Signed)
CT scan performed without IV contrast.  Consistent with ileus.  Few dots of gas in the right diaphragm only.  No undrained fluid collections.  Does not look like there is any closed-loop obstruction hernia.  Some gas in the duodenum but does not seem consistent with pneumatosis.    Contrast does not get to the anastomosis.  I agree with Dr Clovis Riley with radiology that while there is no frank dehiscence or massive pneumoperitoneum that would raise serious concerns for leak, lack of contrast across anastomosis does not rule it out.  While I do not enjoy taking back down I think we need to do a quick repeat CT with only rectal contrast to better assess the anastomosis. .  ADDENDUM:  CT enema - contrast across anastomosis w/o obvious leak.  Argues against leak - will follow closely

## 2022-07-10 DIAGNOSIS — Z85048 Personal history of other malignant neoplasm of rectum, rectosigmoid junction, and anus: Secondary | ICD-10-CM | POA: Diagnosis not present

## 2022-07-10 LAB — BASIC METABOLIC PANEL
Anion gap: 7 (ref 5–15)
BUN: 23 mg/dL (ref 8–23)
CO2: 23 mmol/L (ref 22–32)
Calcium: 8 mg/dL — ABNORMAL LOW (ref 8.9–10.3)
Chloride: 110 mmol/L (ref 98–111)
Creatinine, Ser: 1.68 mg/dL — ABNORMAL HIGH (ref 0.61–1.24)
GFR, Estimated: 45 mL/min — ABNORMAL LOW (ref >60)
Glucose, Bld: 148 mg/dL — ABNORMAL HIGH (ref 70–99)
Potassium: 3.7 mmol/L (ref 3.5–5.1)
Sodium: 140 mmol/L (ref 135–145)

## 2022-07-10 LAB — GLUCOSE, CAPILLARY
Glucose-Capillary: 139 mg/dL — ABNORMAL HIGH (ref 70–99)
Glucose-Capillary: 141 mg/dL — ABNORMAL HIGH (ref 70–99)
Glucose-Capillary: 144 mg/dL — ABNORMAL HIGH (ref 70–99)
Glucose-Capillary: 147 mg/dL — ABNORMAL HIGH (ref 70–99)
Glucose-Capillary: 170 mg/dL — ABNORMAL HIGH (ref 70–99)

## 2022-07-10 LAB — CBC
HCT: 31.1 % — ABNORMAL LOW (ref 39.0–52.0)
Hemoglobin: 10.2 g/dL — ABNORMAL LOW (ref 13.0–17.0)
MCH: 25.7 pg — ABNORMAL LOW (ref 26.0–34.0)
MCHC: 32.8 g/dL (ref 30.0–36.0)
MCV: 78.3 fL — ABNORMAL LOW (ref 80.0–100.0)
Platelets: 445 10*3/uL — ABNORMAL HIGH (ref 150–400)
RBC: 3.97 MIL/uL — ABNORMAL LOW (ref 4.22–5.81)
RDW: 15.4 % (ref 11.5–15.5)
WBC: 15.9 10*3/uL — ABNORMAL HIGH (ref 4.0–10.5)
nRBC: 0 % (ref 0.0–0.2)

## 2022-07-10 LAB — MAGNESIUM: Magnesium: 2.3 mg/dL (ref 1.7–2.4)

## 2022-07-10 LAB — PHOSPHORUS: Phosphorus: 3.2 mg/dL (ref 2.5–4.6)

## 2022-07-10 MED ORDER — HYDRALAZINE HCL 20 MG/ML IJ SOLN: 10.0000 mg | INTRAMUSCULAR | Status: DC | PRN

## 2022-07-10 MED ORDER — INSULIN ASPART 100 UNIT/ML IJ SOLN
0.0000 [IU] | Freq: Four times a day (QID) | INTRAMUSCULAR | Status: DC
  Administered 2022-07-10 – 2022-07-11 (×4): 2 [IU] via SUBCUTANEOUS
  Administered 2022-07-11: 3 [IU] via SUBCUTANEOUS
  Administered 2022-07-12: 2 [IU] via SUBCUTANEOUS
  Administered 2022-07-12: 3 [IU] via SUBCUTANEOUS
  Administered 2022-07-12 – 2022-07-14 (×9): 2 [IU] via SUBCUTANEOUS
  Administered 2022-07-15 (×2): 3 [IU] via SUBCUTANEOUS

## 2022-07-10 MED ORDER — HEPARIN SOD (PORK) LOCK FLUSH 100 UNIT/ML IV SOLN
500.0000 [IU] | INTRAVENOUS | Status: DC | PRN
  Administered 2022-07-10: 500 [IU]
  Filled 2022-07-10: qty 5

## 2022-07-10 MED ORDER — TRAVASOL 10 % IV SOLN
INTRAVENOUS | Status: AC
  Filled 2022-07-10: qty 1036.8

## 2022-07-10 MED ORDER — SODIUM CHLORIDE 0.9% FLUSH
10.0000 mL | Freq: Two times a day (BID) | INTRAVENOUS | Status: DC
  Administered 2022-07-10 – 2022-07-11 (×3): 10 mL
  Administered 2022-07-12: 20 mL
  Administered 2022-07-12 – 2022-07-21 (×3): 10 mL
  Administered 2022-07-21: 20 mL
  Administered 2022-07-22 – 2022-07-23 (×3): 10 mL

## 2022-07-10 MED ORDER — POTASSIUM CHLORIDE 10 MEQ/100ML IV SOLN
10.0000 meq | INTRAVENOUS | Status: AC
  Administered 2022-07-10 (×2): 10 meq via INTRAVENOUS
  Filled 2022-07-10 (×2): qty 100

## 2022-07-10 MED ORDER — METOPROLOL TARTRATE 5 MG/5ML IV SOLN
5.0000 mg | Freq: Four times a day (QID) | INTRAVENOUS | Status: DC | PRN
  Administered 2022-07-13: 5 mg via INTRAVENOUS
  Filled 2022-07-10: qty 5

## 2022-07-10 MED ORDER — HEPARIN SOD (PORK) LOCK FLUSH 100 UNIT/ML IV SOLN
500.0000 [IU] | INTRAVENOUS | Status: DC
  Filled 2022-07-10: qty 5

## 2022-07-10 MED ORDER — SODIUM CHLORIDE 0.9% FLUSH
10.0000 mL | INTRAVENOUS | Status: DC | PRN
  Administered 2022-07-10: 10 mL

## 2022-07-10 MED ORDER — CHLORHEXIDINE GLUCONATE CLOTH 2 % EX PADS
6.0000 | MEDICATED_PAD | Freq: Every day | CUTANEOUS | Status: DC
  Administered 2022-07-11 – 2022-07-25 (×16): 6 via TOPICAL

## 2022-07-10 NOTE — Progress Notes (Signed)
PT very tired today, multiple attempts made to get up to the chair and unsuccessful due to pt pain/anxiety. PT is agreeable to getting up in the morning.

## 2022-07-10 NOTE — Progress Notes (Signed)
PHARMACY - TOTAL PARENTERAL NUTRITION CONSULT NOTE   Indication: Prolonged ileus  Patient Measurements: Height: '5\' 8"'$  (172.7 cm) Weight: 87.9 kg (193 lb 12.6 oz) IBW/kg (Calculated) : 68.4 TPN AdjBW (KG): 73.1 Body mass index is 29.46 kg/m. Usual Weight:   Assessment: 67 yo male with hx colon cancer diagnosed 2022 s/p transverse colectomy admitted for elective robotic ostomy takedown with lysis of adhesions, incarcerated incisional hernia repair on 07/01/22. Post-op course complicated by right lower lobe collapse and hypoxemia, transferred to stepdown on 07/03/22 for management.  Pharmacy consulted for TPN on 07/08/22 for prolonged ileus.  Glucose / Insulin: Hx DM2 A1c 5.1 (07/01/22) - CBG range 109-170, goal 100-180 - 3 units SSI required. Electrolytes: All WNL, including CorrCa (9.1). K (3.7) slightly below goal -Goal: K >/=4, Mg >/= 2 Renal: CKDIII, SCr up slightly Hepatic: LFTs, TG, T.bili WNL. Alb low Intake / Output: -UOP: 570 mL (decreased), NG output: 1150 mL, stool: 1405 mL + unmeasured -I/O Net: -935 mL MIVF: NS @ KVO GI Imaging: - 2/27 AXR: Previous postoperative pneumoperitoneum is less distinct and may have resolved or at least partially resolved. -2/29 Abdominal: Moderate dilation of small bowel loops, suggesting ileus. Linear collection of air adjacent to the right hemidiaphragm. Possibility of pneumoperitoneum is not excluded.  -3/1 CT Abd: No evidence of leak or abscess GI Surgeries / Procedures:  - 2/22: robotic colostomy takedown and lysis of adhesions  Central access: implanted port TPN start date: 2/29  Nutritional Goals: Goal TPN rate is 100 mL/hr (provides 130 g of protein and 2486  kcals per day)  RD Assessment: pending Estimated Needs Total Energy Estimated Needs: 2400-2600 kcals Total Protein Estimated Needs: 125-140 grams Total Fluid Estimated Needs: >/= 2.4L  Current Nutrition:  CLD TPN  Plan:  Now: KCl 10 mEq x 2   At 1800: Increase TPN  to 80 mL/hr at 1800 Electrolytes in TPN: No changes Na 58mq/L, K 59m/L, Ca 24m66mL, Mg 5mE35m, and Phos 15mm824m.  Cl:Ac 1:1 Add standard MVI and trace elements to TPN Thiamine x 5 days (3/1 > 3/5) Increase q6h SSI from sensitive to moderate mIVF at KVO. [facility]ther management by MD. Monitor TPN labs on Mon/Thurs. Recheck electrolytes with AM labs tomorrow.   Oretha Weismann Lenis NoonrmD 07/10/22 9:23 AM

## 2022-07-10 NOTE — Progress Notes (Signed)
Thomas [name] [id number] May 31, 1955  CARE TEAM:  PCP: [name], NP  Outpatient Care Team: Patient Care Team: [name], NP as PCP - General Benito Mccreedy, MD (Internal Medicine) Patient, No Pcp Per (General Practice) Michael Boston, MD as Consulting Physician (Colon and Rectal Surgery) Irene Shipper, MD as Consulting Physician (Gastroenterology) Ladell Pier, MD as Consulting Physician (Oncology)  Inpatient Treatment Team: Treatment Team: Attending Provider: Michael Boston, MD; Rounding Team: Garner Gavel, MD; Consulting Physician: Alma Friendly, MD; Charge Nurse: Kathy Breach, RN; Teleneurology Nurse: Benard Halsted, NT; Registered Nurse: Marlene Bast, RN   Problem List:   Principal Problem:   History of colorectal cancer Active Problems:   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   History of ischemic left MCA stroke 2015   Type 2 diabetes mellitus with diabetic nephropathy, without long-term current use of insulin ([place])   Hypertension   Diabetic polyneuropathy associated with type 2 diabetes mellitus ([place])   Colon cancer ([place])   Protein-calorie malnutrition, moderate ([place])   Epilepsy ([place])   Hyperlipidemia   Gastroesophageal reflux disease without esophagitis   Delirium   Hypokalemia   History of meningioma of the brain   Incisional hernia, without obstruction or gangrene   CKD (chronic kidney disease) stage 3, GFR 30-59 ml/min ([place])   Insomnia due to medical condition   Hypophosphatemia   Hiccoughs   9 Days Post-Op  07/01/2022  POST-OPERATIVE DIAGNOSIS:   COLOSTOMY FOR COLON RESECTION INCARCERATED INCISIONAL HERNIA   PROCEDURE:   TAKEDOWN OF END COLOSTOMY WITH ANASTOMOSIS INTRAOPERATIVE ASSESSMENT OF TISSUE VASCULAR PERFUSION USING ICG (indocyanine green) IMMUNOFLUORESCENCE TRANSVERSUS ABDOMINIS PLANE (TAP) BLOCK - BILATERAL LYSIS OF ADHESIONS x90 MINUTES (50% OF CASE) PRIMARY REPAIR OF  INCARCERATED INCISIONAL HERNIA   SURGEON:  Adin Hector, MD  OR FINDINGS:    Patient had very dense intra-abdominal adhesions of small bowel to all quadrants of the abdomen as well as colon.  He had a loop of small bowel incarcerated in a periumbilical midline incisional hernia 4 x 4 cm.  Reduced and primarily repaired.   No obvious metastatic disease on visceral parietal peritoneum or liver.   It is an isoperistaltic ileocolonic anastomosis (proximal transverse colon to distal transverse colon) that rests in the epigastric region.    Assessment  Postop ileus with aspiration pneumonitis.  [facility] Stay = 9 days)  Plan:  NG tube with sips of clears for now.  If output tapers off and still has bowel function can can try more aggressive clamping trial.  Will keep Flexi-Seal for now given diarrhea between enteral contrast enema and ileus somewhat resolving.  Hopefully get out sooner.  CT scan shows no abscess undrained fluid collection or delayed leak.  Lung improved.  Stop antibiotics and follow.  Some delirium with anxiety.  Challenge when he is off his usual Keppra, lorazepam, gabapentin.  Stepdown setting.  Defer to pulmonary critical care.  I do not know if Haldol and Ativan are great combination but we will have to see.  Done home and in the drawing either.  Seems to be sundowning.  Check ammonia and LFTs since he had had that elevated in the past.  -I would not trust his digestive tract for any pills till he is tolerating liquids and NG tube is out.  Soon as Sunday.  We will see.  TPN for some developing malnutrition.  Provides closely.  Awaiting a.m. labs.  He has had issues with hypokalemia &  hypophosphatemia.    Chronic kidney disease but nonoliguric.  Removed with condom cath as needed and I&O as needed.  Place Foley on second I&O attempt.  IV antibiotics.  Zosyn 2/24 - 3/2.  As of any infection in the abdomen lung or pelvis or abdominal.  Follow  Current concern for  some plugging.  Pulmonary critical care following.  Seems to improved with physical therapy before considering bronchoscopy.  Pulmonary status seems to be stable.   Some acute blood loss anemia in setting of chronic anemia of chronic disease and iron deficiency anemia.  Concerns by patient's wife and some blood noted and NG tube residual checks.  Noted that can happen.  Continue PPI.  Hemoglobin stable.  Not hypotensive.  Follow.  -VTE prophylaxis- SCDs, etc  -mobilize as tolerated to help recovery.  Rather deconditioned but was able to mobilize in a wheelchair and up to the chair several times yesterday    Disposition:  The patient is from: [place] discharge to:  [place] (SNF)  Anticipated Date of Discharge is:  March 8,2024    Barriers to discharge:  Pending Clinical improvement (more likely than not)  Patient currently is NOT MEDICALLY STABLE for discharge from the hospital from a surgery standpoint.   I updated the patient's status to the patient and nurse at bedside & ICU RNs just outside room.  Recommendations were made.  Questions were answered.  They expressed understanding & appreciation.    I reviewed nursing notes, last 24 h vitals and pain scores, last 48 h intake and output, last 24 h labs and trends, and last 24 h imaging results.  Reviewed notes with respiratory physical therapies as well as critical care nursing.  I have reviewed this patient's available data, including medical history, events of note, test results, etc as part of my evaluation.  A significant portion of that time was spent in counseling.  Care during the described time interval was provided by me.  This care required moderate level of medical decision making.  07/10/2022    Subjective: (Chief complaint)  CT scan done yesterday with initially enteral and rectal contrast with no evidence of any leak Or abscess.  Some abdominal cramping but now with some diarrhea.  Flexi-Seal  placed for now.  NG tube back to low intermittent wall suction.  Patient with less nausea and less hiccuping.  Denies much abdominal pain.  Again having some confusion and sundowning at night.  However worse delirium seems to be resolving.  Objective:  Vital signs:  Vitals:   07/10/22 0100 07/10/22 0400 07/10/22 0500 07/10/22 0600  BP: (!) 141/61 (!) 142/57 (!) 143/64 134/68  Pulse: 83 78 80 76  Resp: 20 20 (!) 22 (!) 25  Temp:      TempSrc:      SpO2: 94% 92% 92% 95%  Weight:   87.9 kg   Height:        Last BM Date : 07/09/22  Intake/Output   Yesterday:  03/01 0701 - 03/02 0700 In: 2189.8 [I.V.:1132.3; IV Piggyback:557.5] Out: 3125 [Urine:570; Emesis/NG output:1150; [id number] This shift:  No intake/output data recorded.  Bowel function:  Flatus: No  BM:  No  Drain:  Nasogastric tube with lightly bilious tinged effluent.  Rather thin.   Physical Exam:  General: Pt being but awakens in no acute distress.  Tired Eyes: PERRL, normal EOM.  Sclera clear.  No icterus Neuro: CN II-XII intact w/o focal sensory/motor deficits. Lymph: No head/neck/groin lymphadenopathy  Psych:  No delerium/psychosis/paranoia.  Oriented x 4.  No confusion or anxiety today. HENT: Normocephalic, Mucus membranes moist.  No thrush Neck: Supple, No tracheal deviation.  No obvious thyromegaly Chest: No pain to chest wall compression.  Good respiratory excursion.  No audible wheezing CV:  Pulses intact.  Regular rhythm.  No major extremity edema MS: Normal AROM mjr joints.  No obvious deformity  Abdomen: Soft.  Moderately distended.  Nontender .  Right upper quadrant old colostomy wound closed and dry.  No purulence or cellulitis.  No guarding nor evidence of peritonitis.  No incarcerated hernias.  Ext:   No deformity.  No mjr edema.  No cyanosis Skin: No petechiae / purpurea.  No major sores.  Warm and dry    Results:   Cultures: Recent Results (from the past 720 hour(s))  MRSA Next  Gen by PCR, Nasal     Status: None   Collection Time: 07/03/22  3:16 PM   Specimen: Nasal Mucosa; Nasal Swab  Result Value Ref Range Status   MRSA by PCR Next Gen NOT DETECTED NOT DETECTED Final    Comment: (NOTE) The GeneXpert MRSA Assay (FDA approved for NASAL specimens only), is one component of a comprehensive MRSA colonization surveillance program. It is not intended to diagnose MRSA infection nor to guide or monitor treatment for MRSA infections. Test performance is not FDA approved in patients less than 18 years old. Performed at [facility], [place] [address] [place], [place] [address]     Labs: Results for orders placed or performed during the hospital encounter of 07/01/22 (from the past 48 hour(s))  Glucose, capillary     Status: Abnormal   Collection Time: 07/08/22  8:28 AM  Result Value Ref Range   Glucose-Capillary 107 (H) 70 - 99 mg/dL    Comment: Glucose reference range applies only to samples taken after fasting for at least 8 hours.   Comment 1 Notify RN    Comment 2 Document in Chart   Glucose, capillary     Status: Abnormal   Collection Time: 07/08/22 12:12 PM  Result Value Ref Range   Glucose-Capillary 110 (H) 70 - 99 mg/dL    Comment: Glucose reference range applies only to samples taken after fasting for at least 8 hours.   Comment 1 Notify RN    Comment 2 Document in Chart   Glucose, capillary     Status: None   Collection Time: 07/08/22  4:22 PM  Result Value Ref Range   Glucose-Capillary 95 70 - 99 mg/dL    Comment: Glucose reference range applies only to samples taken after fasting for at least 8 hours.   Comment 1 Notify RN    Comment 2 Document in Chart   Phosphorus     Status: None   Collection Time: 07/08/22  8:29 PM  Result Value Ref Range   Phosphorus 2.7 2.5 - 4.6 mg/dL    Comment: Performed at [facility], [place] [address] [place], [place] [address]  Glucose, capillary     Status: Abnormal    Collection Time: 07/08/22  8:34 PM  Result Value Ref Range   Glucose-Capillary 116 (H) 70 - 99 mg/dL    Comment: Glucose reference range applies only to samples taken after fasting for at least 8 hours.  Glucose, capillary     Status: Abnormal   Collection Time: 07/08/22 11:57 PM  Result Value Ref Range   Glucose-Capillary 122 (H) 70 - 99 mg/dL  Comment: Glucose reference range applies only to samples taken after fasting for at least 8 hours.  Comprehensive metabolic panel     Status: Abnormal   Collection Time: 07/09/22  3:39 AM  Result Value Ref Range   Sodium 142 135 - 145 mmol/L   Potassium 3.5 3.5 - 5.1 mmol/L   Chloride 110 98 - 111 mmol/L   CO2 25 22 - 32 mmol/L   Glucose, Bld 127 (H) 70 - 99 mg/dL    Comment: Glucose reference range applies only to samples taken after fasting for at least 8 hours.   BUN 18 8 - 23 mg/dL   Creatinine, Ser 1.58 (H) 0.61 - 1.24 mg/dL   Calcium 7.9 (L) 8.9 - 10.3 mg/dL   Total Protein 6.6 6.5 - 8.1 g/dL   Albumin 2.6 (L) 3.5 - 5.0 g/dL   AST 22 15 - 41 U/L   ALT 22 0 - 44 U/L   Alkaline Phosphatase 52 38 - 126 U/L   Total Bilirubin 0.8 0.3 - 1.2 mg/dL   GFR, Estimated 48 (L) >60 mL/min    Comment: (NOTE) Calculated using the CKD-EPI Creatinine Equation (2021)    Anion gap 7 5 - 15    Comment: Performed at [facility], [place] [address] [place], [place] [address]  Magnesium     Status: None   Collection Time: 07/09/22  3:39 AM  Result Value Ref Range   Magnesium 1.8 1.7 - 2.4 mg/dL    Comment: Performed at [facility], [place] [address] [place], [place] [address]  Phosphorus     Status: None   Collection Time: 07/09/22  3:39 AM  Result Value Ref Range   Phosphorus 3.0 2.5 - 4.6 mg/dL    Comment: Performed at [facility], [place] [address] [place], [place] [address]  Triglycerides     Status: None   Collection Time: 07/09/22  3:39 AM  Result Value Ref Range   Triglycerides 105 <150  mg/dL    Comment: Performed at [facility], [place] [address], [place], [place] [address]  CBC     Status: Abnormal   Collection Time: 07/09/22  3:39 AM  Result Value Ref Range   WBC 16.4 (H) 4.0 - 10.5 K/uL   RBC 3.70 (L) 4.22 - 5.81 MIL/uL   Hemoglobin 9.6 (L) 13.0 - 17.0 g/dL   HCT 29.2 (L) 39.0 - 52.0 %   MCV 78.9 (L) 80.0 - 100.0 fL   MCH 25.9 (L) 26.0 - 34.0 pg   MCHC 32.9 30.0 - 36.0 g/dL   RDW 15.0 11.5 - 15.5 %   Platelets 328 150 - 400 K/uL   nRBC 0.0 0.0 - 0.2 %    Comment: Performed at [facility], [place] [address] [place], [place] [address]  Glucose, capillary     Status: Abnormal   Collection Time: 07/09/22  4:17 AM  Result Value Ref Range   Glucose-Capillary 118 (H) 70 - 99 mg/dL    Comment: Glucose reference range applies only to samples taken after fasting for at least 8 hours.  Glucose, capillary     Status: Abnormal   Collection Time: 07/09/22  8:07 AM  Result Value Ref Range   Glucose-Capillary 128 (H) 70 - 99 mg/dL    Comment: Glucose reference range applies only to samples taken after fasting for at least 8 hours.   Comment 1 Notify RN   Glucose, capillary  Status: Abnormal   Collection Time: 07/09/22 11:19 AM  Result Value Ref Range   Glucose-Capillary 120 (H) 70 - 99 mg/dL    Comment: Glucose reference range applies only to samples taken after fasting for at least 8 hours.  Glucose, capillary     Status: Abnormal   Collection Time: 07/09/22  4:28 PM  Result Value Ref Range   Glucose-Capillary 130 (H) 70 - 99 mg/dL    Comment: Glucose reference range applies only to samples taken after fasting for at least 8 hours.   Comment 1 Notify RN   Glucose, capillary     Status: Abnormal   Collection Time: 07/09/22  7:46 PM  Result Value Ref Range   Glucose-Capillary 109 (H) 70 - 99 mg/dL    Comment: Glucose reference range applies only to samples taken after fasting for at least 8 hours.  Glucose, capillary     Status:  Abnormal   Collection Time: 07/10/22 12:01 AM  Result Value Ref Range   Glucose-Capillary 170 (H) 70 - 99 mg/dL    Comment: Glucose reference range applies only to samples taken after fasting for at least 8 hours.  Glucose, capillary     Status: Abnormal   Collection Time: 07/10/22  3:28 AM  Result Value Ref Range   Glucose-Capillary 141 (H) 70 - 99 mg/dL    Comment: Glucose reference range applies only to samples taken after fasting for at least 8 hours.    Imaging / Studies: CT ABDOMEN PELVIS WO CONTRAST  Result Date: 07/09/2022 CLINICAL DATA:  Evaluate for possible postoperative leak EXAM: CT ABDOMEN AND PELVIS WITHOUT CONTRAST TECHNIQUE: Multidetector CT imaging of the abdomen and pelvis was performed following the standard protocol without IV contrast. RADIATION DOSE REDUCTION: This exam was performed according to the departmental dose-optimization program which includes automated exposure control, adjustment of the mA and/or kV according to patient size and/or use of iterative reconstruction technique. COMPARISON:  CT from earlier in the same day. FINDINGS: Lower chest: Small bilateral pleural effusions are noted. The chest is not completely evaluated in this exam. Hepatobiliary: The visualized liver and gallbladder are within normal limits. The previously seen hypodense lesion is not included on this exam. Pancreas: Unremarkable. No pancreatic ductal dilatation or surrounding inflammatory changes. Spleen: Normal in size without focal abnormality. Adrenals/Urinary Tract: Adrenal glands are within normal limits. Contrast is noted within the renal collecting systems from the recent CT examination. Simple appearing cysts are noted in the kidneys bilaterally. No follow-up is recommended. The bladder is well distended with opacified and unopacified urine as well as air similar to that seen on the prior CT examination. Stomach/Bowel: Rectal tube is noted in place with installation of rectal contrast  which courses in a retrograde fashion towards the recent transverse colon anastomosis. Contrast material passes through the anastomosis into the more proximal transverse colon. No leakage of contrast is identified to suggest postoperative leak. Some persistent free air is noted similar to that seen on the prior exam. The visualized small bowel loops again are mildly prominent but stable in appearance from the earlier study. Again this likely represents a postoperative ileus. Stomach shows a gastric catheter within. No other focal abnormality is noted. Vascular/Lymphatic: Aortic atherosclerosis. Stable appearing iliac lymph nodes are noted similar to that seen earlier in the same day. Reproductive: Prostate is unremarkable. Other: Free air is again identified as previously described. Fat containing inguinal hernias are noted bilaterally left greater than right Musculoskeletal: Changes of prior colostomy are noted  in the anterior abdominal wall on the right. No bony abnormality is noted. IMPRESSION: Stable postoperative changes without evidence of postoperative leak. No perianastomotic contrast is noted. No definitive fluid collection is seen. Persistent mild small bowel dilatation again likely favoring a postoperative ileus. Stable free air from prior surgery. Remainder of the exam is stable from the study obtained 4 hours previous. Electronically Signed   By: Inez Catalina M.D.   On: 07/09/2022 19:29   CT ABDOMEN PELVIS W CONTRAST  Addendum Date: 07/09/2022   ADDENDUM REPORT: 07/09/2022 15:05 ADDENDUM: Not mentioned in the original impression: There is a indeterminate hypodense lesion within segment 7 of the liver, which was not seen on previous imaging. In a patient who has a history of malignancy a small metastasis to the liver cannot be excluded. When the patient is clinically stable, able to remain motionless and breath hold (ideally as an outpatient) consider more definitive characterization with contrast  enhanced liver MRI. Electronically Signed   By: Kerby Moors M.D.   On: 07/09/2022 15:05   Result Date: 07/09/2022 CLINICAL DATA:  Abdominal pain status post takedown of in colostomy with anastomosis. Postop ileus. Evaluate for leak or abscess. EXAM: CT ABDOMEN AND PELVIS WITH CONTRAST TECHNIQUE: Multidetector CT imaging of the abdomen and pelvis was performed using the standard protocol following bolus administration of intravenous contrast. RADIATION DOSE REDUCTION: This exam was performed according to the departmental dose-optimization program which includes automated exposure control, adjustment of the mA and/or kV according to patient size and/or use of iterative reconstruction technique. CONTRAST:  13m OMNIPAQUE IOHEXOL 300 MG/ML  SOLN COMPARISON:  03/05/2022 FINDINGS: Lower chest: There are small bilateral pleural effusions with overlying compressive type atelectasis. Hepatobiliary: Within the right lobe of liver, segment 7 there is a 1.2 cm ill-defined low-density structure measuring 1.2 cm, image 20/3. Gallbladder appears normal. No bile duct dilatation. Pancreas: Unremarkable. No pancreatic ductal dilatation or surrounding inflammatory changes. Spleen: Normal in size without focal abnormality. Adrenals/Urinary Tract: Normal adrenal glands. No nephrolithiasis or hydronephrosis. Bilateral simple appearing kidney cysts measure up to 4.1 cm, image 45/3. No follow-up imaging recommended. There is a small amount of gas within the non dependent portion of the bladder. Bladder is otherwise unremarkable. Stomach/Bowel: Nasogastric tube is identified within the stomach. Enteric contrast material is identified within the stomach and proximal small bowel loops. No significant contrast is identified within the distal small bowel. Within the anterior ventral abdomen there are signs of enterocolonic anastomosis, image 38/3. The small bowel loops proximal to the anastomosis are increased in caliber with scattered  air-fluid levels measuring up to 3.8 cm, image 56/3. Lack of contrast material within the distal small bowel and proximal colon limits assessment for a anastomotic dehiscence. The colon distal to the anastomosis is decreased in caliber. Vascular/Lymphatic: Aortic atherosclerosis. No aneurysm. Mild diffuse mesenteric edema. Small bilateral pelvic lymph nodes are identified measuring up to 8 mm. No adenopathy identified. Reproductive: Prostate is unremarkable. Other: There is a small volume of residual pneumoperitoneum best noted along the undersurface of the right hemidiaphragm, image 24/3. No convincing evidence for loculated fluid collection. There is a small volume of free fluid within the abdomen and pelvis. Musculoskeletal: Within the right lower quadrant scratch set within the right ventral abdominal wall at the prior ostomy site 2 small hematomas are noted which measure up to 2.2 cm, image 47/3. No acute or suspicious osseous findings. IMPRESSION: 1. There are signs of enterocolonic anastomosis within the anterior ventral abdomen. Lack of contrast  material within the distal small bowel and proximal colon limits assessment for a anastomotic dehiscence. Within this limitation, there is no convincing evidence for anastomotic dehiscence or perianastomotic fluid collection. If there is a continued concern for anastomotic dehiscence repeat CT AP with rectal contrast material may be helpful for further investigation. 2. The small bowel loops proximal to the anastomosis are increased in caliber with scattered air-fluid levels measuring up to 3.8 cm. Imaging findings are favored to represent postoperative ileus. 3. Small volume of residual pneumoperitoneum best noted along the undersurface of the right hemidiaphragm. 4. No convincing evidence for loculated fluid collection to suggest abscess. 5. Small volume of free fluid within the abdomen and pelvis. 6. Small bilateral pleural effusions with overlying compressive  type atelectasis. 7. Small amount of gas within the non dependent portion of the bladder. Likely related instrumentation 8. Small hematomas within the right ventral abdominal wall at the prior ostomy site. Likely postoperative. 9.  Aortic Atherosclerosis (ICD10-I70.0). Electronically Signed: By: Kerby Moors M.D. On: 07/09/2022 14:48   [place] EKG SITE RITE  Result Date: 07/09/2022 If Site Rite image not attached, placement could not be confirmed due to current cardiac rhythm.  DG Abd 2 Views  Addendum Date: 07/08/2022   ADDENDUM REPORT: 07/08/2022 10:05 ADDENDUM: Imaging findings were discussed with Dr. Johney Maine. Patient apparently had recent abdominal surgery which may account for pneumoperitoneum. Electronically Signed   By: Elmer Picker M.D.   On: 07/08/2022 10:05   Result Date: 07/08/2022 CLINICAL DATA:  Abdominal distention, ileus EXAM: ABDOMEN - 2 VIEW COMPARISON:  07/06/2022 FINDINGS: There is mild to moderate dilation of small-bowel loops suggesting ileus. Gas is present in colon. Stomach is not distended. Tip of enteric tube is seen in the region of body of stomach. In the upright view, there is curvilinear air density adjacent to the right hemidiaphragm. Visualized lower lung fields are clear. IMPRESSION: There is linear collection of air adjacent to the right hemidiaphragm. Possibility of pneumoperitoneum is not excluded. This may suggest bowel perforation or recent surgical intervention in abdomen. Please correlate with clinical history and physical examination findings. There is mild to moderate dilation of small-bowel loops suggesting possible ileus. Tip of enteric tube is seen in the region of body of the stomach. We are attempting to contact the patient's provider. I talked with patient's nurse Eliezer Lofts by telephone call and she is trying to reach the provider. I left my telephone number with Jenna. Also, these results will be called to the ordering clinician or representative by the  Radiologist Assistant, and communication documented in the PACS or [employer]. Electronically Signed: By: Elmer Picker M.D. On: 07/08/2022 09:27   DG Chest Port 1 View  Result Date: 07/08/2022 CLINICAL DATA:  Pneumonitis EXAM: PORTABLE CHEST 1 VIEW COMPARISON:  Radiograph 07/05/2022 FINDINGS: Chest port catheter tip overlies the right atrium. Nasogastric tube tip overlies the stomach. Unchanged cardiomediastinal silhouette. There is free intraperitoneal gas under the right hemidiaphragm, which is a known finding. Improved aeration of the right lower lung with persistent trace pleural effusions. No evidence of pneumothorax. Bones are unchanged. IMPRESSION: Improved aeration of the right lower lobe with persistent trace pleural effusions Persistent postoperative pneumoperitoneum. Electronically Signed   By: Maurine Simmering M.D.   On: 07/08/2022 09:09    Medications / Allergies: per chart  Antibiotics: Anti-infectives (From admission, onward)    Start     Dose/Rate Route Frequency Ordered Stop   07/10/22 0400  piperacillin-tazobactam (ZOSYN) IVPB 3.375 g  3.375 g 12.5 mL/hr over 240 Minutes Intravenous Every 8 hours 07/09/22 2040 07/13/22 1159   07/08/22 1100  piperacillin-tazobactam (ZOSYN) IVPB 3.375 g  Status:  Discontinued        3.375 g 12.5 mL/hr over 240 Minutes Intravenous Every 8 hours 07/08/22 1001 07/09/22 2040   07/08/22 1000  erythromycin 250 mg in sodium chloride 0.9 % 100 mL IVPB  Status:  Discontinued        250 mg 100 mL/hr over 60 Minutes Intravenous Every 12 hours 07/08/22 0739 07/09/22 0730   07/06/22 0915  erythromycin 250 mg in sodium chloride 0.9 % 100 mL IVPB  Status:  Discontinued        250 mg 100 mL/hr over 60 Minutes Intravenous Every 8 hours 07/06/22 0823 07/08/22 0739   07/03/22 1700  piperacillin-tazobactam (ZOSYN) IVPB 3.375 g        3.375 g 12.5 mL/hr over 240 Minutes Intravenous Every 8 hours 07/03/22 1612 07/08/22 0450   07/02/22 0000   cefoTEtan (CEFOTAN) 2 g in sodium chloride 0.9 % 100 mL IVPB        2 g 200 mL/hr over 30 Minutes Intravenous Every 12 hours 07/01/22 1737 07/02/22 0051   07/01/22 1400  neomycin (MYCIFRADIN) tablet 1,000 mg  Status:  Discontinued       See Hyperspace for full Linked Orders Report.   1,000 mg Oral 3 times per day 07/01/22 1054 07/01/22 1109   07/01/22 1400  metroNIDAZOLE (FLAGYL) tablet 1,000 mg  Status:  Discontinued       See Hyperspace for full Linked Orders Report.   1,000 mg Oral 3 times per day 07/01/22 1054 07/01/22 1109   07/01/22 1100  cefoTEtan (CEFOTAN) 2 g in sodium chloride 0.9 % 100 mL IVPB        2 g 200 mL/hr over 30 Minutes Intravenous On call to O.R. 07/01/22 1054 07/01/22 1204         Note: Portions of this report may have been transcribed using voice recognition software. Every effort was made to ensure accuracy; however, inadvertent computerized transcription errors may be present.   Any transcriptional errors that result from this process are unintentional.    Adin Hector, MD, FACS, MASCRS Esophageal, Gastrointestinal & Colorectal Surgery Robotic and Minimally Invasive Surgery  [facility]. [address] [place], [place] [address] [phone] Fax [phone] Main  CONTACT INFORMATION:  Weekday (9AM-5PM): Call [facility] main office at [phone]  Weeknight (5PM-9AM) or Weekend/Holiday: Check [email or web address] (password " [email or web address]") for General Surgery [facility] coverage  (Please, do not use SecureChat as it is not reliable communication to reach operating surgeons for immediate patient care given surgeries/outpatient duties/clinic/cross-coverage/off post-call which would lead to a delay in care.  Epic staff messaging available for outptient concerns, but may not be answered for 48 hours or more).     07/10/2022  7:02 AM

## 2022-07-10 NOTE — Progress Notes (Signed)
PROGRESS NOTE  Thomas [name] [id number] DOB: 1955/09/08 DOA: 07/01/2022 PCP: [name], NP  HPI/Recap of past 24 hours: Thomas [name] is a 66 y.o. male with past medical history of CKD stage IIIb, diabetes mellitus, hypertension, hyperlipidemia, colon cancer diagnosed in 2022, dysphagia, brain tumor during 2018, acute ischemic left MCA stroke in 2015, history of seizure disorder presents with a complex surgical history was admitted for an elective robotic ostomy takedown with lysis of adhesions on February 22, postoperatively noted to have right lower lobe collapse and hypoxemia. Moved to stepdown on February 24 for management and has remained in stepdown due to associated complications.  Has been followed by PCCM while in stepdown.  PCCM is stabilized his respiratory state and now currently on room air.  [facility] hospitalist consulted to assist in managing his other medical comorbidities.     Today, patient just received some Ativan and pain meds prior to my arrival.  Noted to be sleepy, but easily arousable.   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   Diabetic polyneuropathy associated with type 2 diabetes mellitus ([place])   History of ischemic left MCA stroke 2015   Colon cancer ([place])   Protein-calorie malnutrition, moderate (H[facility])   Epilepsy ([place])   Hyperlipidemia   Gastroesophageal reflux disease without esophagitis   Delirium   Hypokalemia   History of meningioma of the brain   Incisional hernia, without obstruction or gangrene   CKD (chronic kidney disease) stage 3, GFR 30-59 ml/min (HCC)   Insomnia due to medical condition   Hypophosphatemia   Hiccoughs   S/p lysis of adhesions, colostomy takedown Ileus Further management as per  general surgery who is the primary attending   Acute hypoxic respiratory failure likely 2/2 mucous plugging right lower lobe with collapse- Resolved Noted dysphagia with aspiration events on 2/23 Currently now on room air, saturating well Continue clear liquid diet, TPN, no oral meds for now as per general surgery Pulmonary hygiene, incentive spirometer/flutter   Leukocytosis Currently afebrile Repeat CT abdomen/pelvis with no abscess or delayed leak Daily cbc   CKD stage IIIb Creatinine with a slight bump Bladder scan to r/o urinary retention as output has been poor Daily BMP   History of hypertension BP somewhat stable Continue IV hydralazine as needed until ok by surgery for p.o. meds As needed IV labetalol if BP not controlled   Diabetes mellitus type 2 Last A1c 5.1 Diet, Accu-Cheks, hypoglycemic protocol   History of CVA History of brain mass Seizure disorder Continue IV Keppra   GERD Continue IV PPI   Noted possible hospital-acquired delirium Anxiety Continue nightly Haldol, as needed Ativan Delirium precautions May consider Seroquel at bedtime once able to take orally and try atarax prn for anxiety    Malnutrition Type:  Nutrition Problem: Moderate Malnutrition Etiology: chronic illness (transverse colon mass)   Malnutrition Characteristics:  Signs/Symptoms: moderate fat depletion, moderate muscle depletion   Nutrition Interventions:  Interventions: Refer to RD note for recommendations, TPN    Estimated body mass index is 29.46 kg/m as calculated from the following:   Height as of this encounter: '5\' 8"'$  (1.727 m).   Weight as of this encounter: 87.9 kg.     Code Status: Full  Family Communication: None at bedside  Disposition Plan: Status is: Inpatient Remains  inpatient appropriate because: Level of care      Consultants: [facility]  Procedures: S/p lysis of adhesions, colostomy takedown  Antimicrobials: None  DVT prophylaxis:   lovenox  Objective: Vitals:   07/10/22 1000 07/10/22 1100 07/10/22 1145 07/10/22 1200  BP: (!) 149/52 (!) 152/65  (!) 151/63  Pulse: 79 85  84  Resp: (!) '21 13  16  '$ Temp:   99.5 F (37.5 C)   TempSrc:   Axillary   SpO2: 100% 98%  98%  Weight:      Height:        Intake/Output Summary (Last 24 hours) at 07/10/2022 1316 Last data filed at 07/10/2022 1203 Gross per 24 hour  Intake 2983.03 ml  Output 3465 ml  Net -481.97 ml   Filed Weights   07/08/22 0500 07/09/22 0400 07/10/22 0500  Weight: 88 kg 88.2 kg 87.9 kg    Exam: General: NAD, chronically ill appearing  Cardiovascular: S1, S2 present Respiratory: CTAB Abdomen: Soft, +tender, +distended, bowel sounds present, dressing c/d/i Musculoskeletal: No bilateral pedal edema noted Skin: Normal Psychiatry: Fair mood     Data Reviewed: CBC: Recent Labs  Lab 07/06/22 0622 07/07/22 0500 07/08/22 0259 07/09/22 0339 07/10/22 0657  WBC 12.5* 13.9* 16.0* 16.4* 15.9*  HGB 8.6* 8.6* 8.7* 9.6* 10.2*  HCT 25.4* 25.4* 25.4* 29.2* 31.1*  MCV 77.2* 77.7* 76.7* 78.9* 78.3*  PLT 285 309 301 328 [id number]*   Basic Metabolic Panel: Recent Labs  Lab 07/05/22 1807 07/06/22 0622 07/06/22 0830 07/07/22 0500 07/08/22 0259 07/08/22 2029 07/09/22 0339 07/10/22 0657  NA 143 143  --   --  139  --  142 140  K 2.9* 3.4*  --  3.5 3.4*  --  3.5 3.7  CL 111 108  --   --  108  --  110 110  CO2 26 27  --   --  26  --  25 23  GLUCOSE 115* 120*  --   --  110*  --  127* 148*  BUN 21 18  --   --  16  --  18 23  CREATININE 1.47* 1.47*  --  1.56* 1.53*  --  1.58* 1.68*  CALCIUM 7.6* 7.9*  --   --  7.8*  --  7.9* 8.0*  MG 2.2  --  2.1  --  2.0  --  1.8 2.3  PHOS  --   --   --   --  1.2* 2.7 3.0 3.2   GFR: Estimated Creatinine Clearance: 47.2 mL/min (A) (by C-G formula based on SCr of 1.68 mg/dL (H)). Liver Function Tests: Recent Labs  Lab 07/07/22 1006 07/09/22 0339  AST 22 22  ALT 19 22  ALKPHOS 54 52  BILITOT 0.9 0.8  PROT 5.8* 6.6   ALBUMIN 2.5* 2.6*   No results for input(s): "LIPASE", "AMYLASE" in the last 168 hours. Recent Labs  Lab 07/07/22 1006  AMMONIA 17   Coagulation Profile: No results for input(s): "INR", "PROTIME" in the last 168 hours. Cardiac Enzymes: No results for input(s): "CKTOTAL", "CKMB", "CKMBINDEX", "TROPONINI" in the last 168 hours. BNP (last 3 results) No results for input(s): "PROBNP" in the last 8760 hours. HbA1C: No results for input(s): "HGBA1C" in the last 72 hours. CBG: Recent Labs  Lab 07/09/22 1628 07/09/22 1946 07/10/22 0001 07/10/22 0328 07/10/22 1151  GLUCAP 130* 109* 170* 141* 147*   Lipid Profile: Recent Labs    07/09/22 0339  TRIG 105   Thyroid Function Tests: No  results for input(s): "TSH", "T4TOTAL", "FREET4", "T3FREE", "THYROIDAB" in the last 72 hours. Anemia Panel: No results for input(s): "VITAMINB12", "FOLATE", "FERRITIN", "TIBC", "IRON", "RETICCTPCT" in the last 72 hours. Urine analysis:    Component Value Date/Time   COLORURINE YELLOW 09/29/2021 0808   APPEARANCEUR CLEAR 09/29/2021 0808   LABSPEC 1.018 09/29/2021 0808   PHURINE 5.0 09/29/2021 0808   GLUCOSEU NEGATIVE 09/29/2021 0808   HGBUR SMALL (A) 09/29/2021 0808   BILIRUBINUR NEGATIVE 09/29/2021 0808   KETONESUR NEGATIVE 09/29/2021 0808   PROTEINUR 30 (A) 09/29/2021 0808   UROBILINOGEN 0.2 09/27/2014 0301   NITRITE NEGATIVE 09/29/2021 0808   LEUKOCYTESUR NEGATIVE 09/29/2021 0808   Sepsis Labs: '@LABRCNTIP'$ (procalcitonin:4,lacticidven:4)  ) Recent Results (from the past 240 hour(s))  MRSA Next Gen by PCR, Nasal     Status: None   Collection Time: 07/03/22  3:16 PM   Specimen: Nasal Mucosa; Nasal Swab  Result Value Ref Range Status   MRSA by PCR Next Gen NOT DETECTED NOT DETECTED Final    Comment: (NOTE) The GeneXpert MRSA Assay (FDA approved for NASAL specimens only), is one component of a comprehensive MRSA colonization surveillance program. It is not intended to diagnose MRSA  infection nor to guide or monitor treatment for MRSA infections. Test performance is not FDA approved in patients less than 36 years old. Performed at [facility], [place] [address], [place], [place] [address]       Studies: CT ABDOMEN PELVIS WO CONTRAST  Result Date: 07/09/2022 CLINICAL DATA:  Evaluate for possible postoperative leak EXAM: CT ABDOMEN AND PELVIS WITHOUT CONTRAST TECHNIQUE: Multidetector CT imaging of the abdomen and pelvis was performed following the standard protocol without IV contrast. RADIATION DOSE REDUCTION: This exam was performed according to the departmental dose-optimization program which includes automated exposure control, adjustment of the mA and/or kV according to patient size and/or use of iterative reconstruction technique. COMPARISON:  CT from earlier in the same day. FINDINGS: Lower chest: Small bilateral pleural effusions are noted. The chest is not completely evaluated in this exam. Hepatobiliary: The visualized liver and gallbladder are within normal limits. The previously seen hypodense lesion is not included on this exam. Pancreas: Unremarkable. No pancreatic ductal dilatation or surrounding inflammatory changes. Spleen: Normal in size without focal abnormality. Adrenals/Urinary Tract: Adrenal glands are within normal limits. Contrast is noted within the renal collecting systems from the recent CT examination. Simple appearing cysts are noted in the kidneys bilaterally. No follow-up is recommended. The bladder is well distended with opacified and unopacified urine as well as air similar to that seen on the prior CT examination. Stomach/Bowel: Rectal tube is noted in place with installation of rectal contrast which courses in a retrograde fashion towards the recent transverse colon anastomosis. Contrast material passes through the anastomosis into the more proximal transverse colon. No leakage of contrast is identified to suggest postoperative leak.  Some persistent free air is noted similar to that seen on the prior exam. The visualized small bowel loops again are mildly prominent but stable in appearance from the earlier study. Again this likely represents a postoperative ileus. Stomach shows a gastric catheter within. No other focal abnormality is noted. Vascular/Lymphatic: Aortic atherosclerosis. Stable appearing iliac lymph nodes are noted similar to that seen earlier in the same day. Reproductive: Prostate is unremarkable. Other: Free air is again identified as previously described. Fat containing inguinal hernias are noted bilaterally left greater than right Musculoskeletal: Changes of prior colostomy are noted in the anterior abdominal wall on the  right. No bony abnormality is noted. IMPRESSION: Stable postoperative changes without evidence of postoperative leak. No perianastomotic contrast is noted. No definitive fluid collection is seen. Persistent mild small bowel dilatation again likely favoring a postoperative ileus. Stable free air from prior surgery. Remainder of the exam is stable from the study obtained 4 hours previous. Electronically Signed   By: Inez Catalina M.D.   On: 07/09/2022 19:29   CT ABDOMEN PELVIS W CONTRAST  Addendum Date: 07/09/2022   ADDENDUM REPORT: 07/09/2022 15:05 ADDENDUM: Not mentioned in the original impression: There is a indeterminate hypodense lesion within segment 7 of the liver, which was not seen on previous imaging. In a patient who has a history of malignancy a small metastasis to the liver cannot be excluded. When the patient is clinically stable, able to remain motionless and breath hold (ideally as an outpatient) consider more definitive characterization with contrast enhanced liver MRI. Electronically Signed   By: Kerby Moors M.D.   On: 07/09/2022 15:05   Result Date: 07/09/2022 CLINICAL DATA:  Abdominal pain status post takedown of in colostomy with anastomosis. Postop ileus. Evaluate for leak or abscess.  EXAM: CT ABDOMEN AND PELVIS WITH CONTRAST TECHNIQUE: Multidetector CT imaging of the abdomen and pelvis was performed using the standard protocol following bolus administration of intravenous contrast. RADIATION DOSE REDUCTION: This exam was performed according to the departmental dose-optimization program which includes automated exposure control, adjustment of the mA and/or kV according to patient size and/or use of iterative reconstruction technique. CONTRAST:  163m OMNIPAQUE IOHEXOL 300 MG/ML  SOLN COMPARISON:  03/05/2022 FINDINGS: Lower chest: There are small bilateral pleural effusions with overlying compressive type atelectasis. Hepatobiliary: Within the right lobe of liver, segment 7 there is a 1.2 cm ill-defined low-density structure measuring 1.2 cm, image 20/3. Gallbladder appears normal. No bile duct dilatation. Pancreas: Unremarkable. No pancreatic ductal dilatation or surrounding inflammatory changes. Spleen: Normal in size without focal abnormality. Adrenals/Urinary Tract: Normal adrenal glands. No nephrolithiasis or hydronephrosis. Bilateral simple appearing kidney cysts measure up to 4.1 cm, image 45/3. No follow-up imaging recommended. There is a small amount of gas within the non dependent portion of the bladder. Bladder is otherwise unremarkable. Stomach/Bowel: Nasogastric tube is identified within the stomach. Enteric contrast material is identified within the stomach and proximal small bowel loops. No significant contrast is identified within the distal small bowel. Within the anterior ventral abdomen there are signs of enterocolonic anastomosis, image 38/3. The small bowel loops proximal to the anastomosis are increased in caliber with scattered air-fluid levels measuring up to 3.8 cm, image 56/3. Lack of contrast material within the distal small bowel and proximal colon limits assessment for a anastomotic dehiscence. The colon distal to the anastomosis is decreased in caliber.  Vascular/Lymphatic: Aortic atherosclerosis. No aneurysm. Mild diffuse mesenteric edema. Small bilateral pelvic lymph nodes are identified measuring up to 8 mm. No adenopathy identified. Reproductive: Prostate is unremarkable. Other: There is a small volume of residual pneumoperitoneum best noted along the undersurface of the right hemidiaphragm, image 24/3. No convincing evidence for loculated fluid collection. There is a small volume of free fluid within the abdomen and pelvis. Musculoskeletal: Within the right lower quadrant scratch set within the right ventral abdominal wall at the prior ostomy site 2 small hematomas are noted which measure up to 2.2 cm, image 47/3. No acute or suspicious osseous findings. IMPRESSION: 1. There are signs of enterocolonic anastomosis within the anterior ventral abdomen. Lack of contrast material within the distal small bowel and  proximal colon limits assessment for a anastomotic dehiscence. Within this limitation, there is no convincing evidence for anastomotic dehiscence or perianastomotic fluid collection. If there is a continued concern for anastomotic dehiscence repeat CT AP with rectal contrast material may be helpful for further investigation. 2. The small bowel loops proximal to the anastomosis are increased in caliber with scattered air-fluid levels measuring up to 3.8 cm. Imaging findings are favored to represent postoperative ileus. 3. Small volume of residual pneumoperitoneum best noted along the undersurface of the right hemidiaphragm. 4. No convincing evidence for loculated fluid collection to suggest abscess. 5. Small volume of free fluid within the abdomen and pelvis. 6. Small bilateral pleural effusions with overlying compressive type atelectasis. 7. Small amount of gas within the non dependent portion of the bladder. Likely related instrumentation 8. Small hematomas within the right ventral abdominal wall at the prior ostomy site. Likely postoperative. 9.  Aortic  Atherosclerosis (ICD10-I70.0). Electronically Signed: By: Kerby Moors M.D. On: 07/09/2022 14:48    Scheduled Meds:  allopurinol  100 mg Oral QHS   bisacodyl  10 mg Rectal Daily   Chlorhexidine Gluconate Cloth  6 each Topical Daily   enoxaparin (LOVENOX) injection  40 mg Subcutaneous Q24H   haloperidol lactate  2 mg Intravenous QHS   insulin aspart  0-15 Units Subcutaneous Q6H   insulin aspart  0-9 Units Subcutaneous Q6H   lip balm   Topical BID   metoCLOPramide (REGLAN) injection  5 mg Intravenous Q6H   pantoprazole (PROTONIX) IV  40 mg Intravenous Q12H    Continuous Infusions:  sodium chloride Stopped (07/10/22 1116)   sodium chloride 10 mL/hr at 07/10/22 1144   levETIRAcetam Stopped (07/10/22 1130)   methocarbamol (ROBAXIN) IV Stopped (07/10/22 0954)   TPN ADULT (ION) 60 mL/hr at 07/10/22 1144   TPN ADULT (ION)       LOS: 9 days     Alma Friendly, MD [facility]  If 7PM-7AM, please contact night-coverage [email or web address] 07/10/2022, 1:16 PM

## 2022-07-10 NOTE — Progress Notes (Signed)
Peripherally Inserted Central Catheter Placement  The IV Nurse has discussed with the patient and/or persons authorized to consent for the patient, the purpose of this procedure and the potential benefits and risks involved with this procedure.  The benefits include less needle sticks, lab draws from the catheter, and the patient may be discharged home with the catheter. Risks include, but not limited to, infection, bleeding, blood clot (thrombus formation), and puncture of an artery; nerve damage and irregular heartbeat and possibility to perform a PICC exchange if needed/ordered by physician.  Alternatives to this procedure were also discussed.  Bard Power PICC patient education guide, fact sheet on infection prevention and patient information card has been provided to patient /or left at bedside.  Telephone consent obtained from wife per pt request.  PICC Placement Documentation  PICC Double Lumen [id number] Right Cephalic 39 cm 0 cm (Active)  Indication for Insertion or Continuance of Line Administration of hyperosmolar/irritating solutions (i.e. TPN, Vancomycin, etc.) 07/10/22 1739  Exposed Catheter (cm) 0 cm 07/10/22 1739  Site Assessment Clean, Dry, Intact 07/10/22 1739  Lumen #1 Status Flushed;Saline locked;Blood return noted 07/10/22 1739  Lumen #2 Status Flushed;Saline locked;Blood return noted 07/10/22 1739  Dressing Type Transparent;Securing device 07/10/22 1739  Dressing Status Antimicrobial disc in place;Clean, Dry, Intact 07/10/22 1739  Safety Lock Not Applicable [id number]  Line Care Connections checked and tightened 07/10/22 1739  Line Adjustment (NICU/IV Team Only) No 07/10/22 1739  Dressing Intervention New dressing 07/10/22 1739  Dressing Change Due 07/17/22 07/10/22 1739       Rolena Infante 07/10/2022, 5:40 PM

## 2022-07-11 DIAGNOSIS — Z85048 Personal history of other malignant neoplasm of rectum, rectosigmoid junction, and anus: Secondary | ICD-10-CM | POA: Diagnosis not present

## 2022-07-11 LAB — CBC
HCT: 27.1 % — ABNORMAL LOW (ref 39.0–52.0)
Hemoglobin: 9.1 g/dL — ABNORMAL LOW (ref 13.0–17.0)
MCH: 26 pg (ref 26.0–34.0)
MCHC: 33.6 g/dL (ref 30.0–36.0)
MCV: 77.4 fL — ABNORMAL LOW (ref 80.0–100.0)
Platelets: 454 10*3/uL — ABNORMAL HIGH (ref 150–400)
RBC: 3.5 MIL/uL — ABNORMAL LOW (ref 4.22–5.81)
RDW: 15.3 % (ref 11.5–15.5)
WBC: 16.7 10*3/uL — ABNORMAL HIGH (ref 4.0–10.5)
nRBC: 0 % (ref 0.0–0.2)

## 2022-07-11 LAB — URINALYSIS, W/ REFLEX TO CULTURE (INFECTION SUSPECTED)
Bilirubin Urine: NEGATIVE
Glucose, UA: NEGATIVE mg/dL
Ketones, ur: NEGATIVE mg/dL
Leukocytes,Ua: NEGATIVE
Nitrite: NEGATIVE
Protein, ur: 30 mg/dL — AB
Specific Gravity, Urine: 1.03 (ref 1.005–1.030)
pH: 5 (ref 5.0–8.0)

## 2022-07-11 LAB — BASIC METABOLIC PANEL
Anion gap: 7 (ref 5–15)
BUN: 29 mg/dL — ABNORMAL HIGH (ref 8–23)
CO2: 22 mmol/L (ref 22–32)
Calcium: 8.2 mg/dL — ABNORMAL LOW (ref 8.9–10.3)
Chloride: 114 mmol/L — ABNORMAL HIGH (ref 98–111)
Creatinine, Ser: 1.68 mg/dL — ABNORMAL HIGH (ref 0.61–1.24)
GFR, Estimated: 45 mL/min — ABNORMAL LOW (ref >60)
Glucose, Bld: 143 mg/dL — ABNORMAL HIGH (ref 70–99)
Potassium: 3.7 mmol/L (ref 3.5–5.1)
Sodium: 143 mmol/L (ref 135–145)

## 2022-07-11 LAB — GLUCOSE, CAPILLARY
Glucose-Capillary: 151 mg/dL — ABNORMAL HIGH (ref 70–99)
Glucose-Capillary: 132 mg/dL — ABNORMAL HIGH (ref 70–99)
Glucose-Capillary: 137 mg/dL — ABNORMAL HIGH (ref 70–99)

## 2022-07-11 LAB — PHOSPHORUS: Phosphorus: 3.1 mg/dL (ref 2.5–4.6)

## 2022-07-11 LAB — MAGNESIUM: Magnesium: 2.4 mg/dL (ref 1.7–2.4)

## 2022-07-11 MED ORDER — METHOCARBAMOL 500 MG PO TABS
1000.0000 mg | ORAL_TABLET | Freq: Four times a day (QID) | ORAL | Status: DC | PRN
  Administered 2022-07-11 – 2022-07-19 (×13): 1000 mg via ORAL
  Filled 2022-07-11 (×13): qty 2

## 2022-07-11 MED ORDER — TRAVASOL 10 % IV SOLN
INTRAVENOUS | Status: DC
  Filled 2022-07-11: qty 1296

## 2022-07-11 MED ORDER — QUETIAPINE FUMARATE 25 MG PO TABS
25.0000 mg | ORAL_TABLET | Freq: Every evening | ORAL | Status: DC | PRN
  Administered 2022-07-17: 25 mg via ORAL
  Filled 2022-07-11: qty 1

## 2022-07-11 MED ORDER — METOCLOPRAMIDE HCL 5 MG/ML IJ SOLN
5.0000 mg | Freq: Three times a day (TID) | INTRAMUSCULAR | Status: DC | PRN
  Administered 2022-07-11 – 2022-07-21 (×3): 10 mg via INTRAVENOUS
  Filled 2022-07-11 (×3): qty 2

## 2022-07-11 MED ORDER — GABAPENTIN 100 MG PO CAPS
100.0000 mg | ORAL_CAPSULE | Freq: Every day | ORAL | Status: DC
  Administered 2022-07-11 – 2022-07-27 (×17): 100 mg via ORAL
  Filled 2022-07-11 (×17): qty 1

## 2022-07-11 MED ORDER — ALBUTEROL SULFATE (2.5 MG/3ML) 0.083% IN NEBU: 2.5000 mg | INHALATION_SOLUTION | Freq: Four times a day (QID) | RESPIRATORY_TRACT | Status: DC | PRN

## 2022-07-11 MED ORDER — TRAVASOL 10 % IV SOLN
INTRAVENOUS | Status: AC
  Filled 2022-07-11: qty 1296

## 2022-07-11 MED ORDER — HALOPERIDOL LACTATE 5 MG/ML IJ SOLN
2.0000 mg | Freq: Every evening | INTRAMUSCULAR | Status: DC | PRN
  Filled 2022-07-11: qty 1

## 2022-07-11 MED ORDER — BISACODYL 10 MG RE SUPP: 10.0000 mg | Freq: Two times a day (BID) | RECTAL | Status: DC | PRN

## 2022-07-11 MED ORDER — PANTOPRAZOLE SODIUM 40 MG PO TBEC
40.0000 mg | DELAYED_RELEASE_TABLET | Freq: Two times a day (BID) | ORAL | Status: DC
  Administered 2022-07-11 – 2022-07-21 (×20): 40 mg via ORAL
  Filled 2022-07-11 (×20): qty 1

## 2022-07-11 MED ORDER — METOCLOPRAMIDE HCL 5 MG PO TABS: 5.0000 mg | ORAL_TABLET | Freq: Three times a day (TID) | ORAL | Status: DC | PRN

## 2022-07-11 MED ORDER — OXYCODONE HCL 5 MG PO TABS
5.0000 mg | ORAL_TABLET | ORAL | Status: DC | PRN
  Administered 2022-07-11 – 2022-07-19 (×25): 10 mg via ORAL
  Administered 2022-07-19: 5 mg via ORAL
  Administered 2022-07-19 – 2022-07-23 (×13): 10 mg via ORAL
  Filled 2022-07-11 (×20): qty 2
  Filled 2022-07-11: qty 1
  Filled 2022-07-11 (×18): qty 2

## 2022-07-11 MED ORDER — ATORVASTATIN CALCIUM 20 MG PO TABS
20.0000 mg | ORAL_TABLET | Freq: Every day | ORAL | Status: DC
  Administered 2022-07-11 – 2022-07-17 (×7): 20 mg via ORAL
  Filled 2022-07-11 (×7): qty 1

## 2022-07-11 MED ORDER — LEVETIRACETAM 500 MG PO TABS
500.0000 mg | ORAL_TABLET | Freq: Two times a day (BID) | ORAL | Status: DC
  Administered 2022-07-11 – 2022-07-23 (×25): 500 mg via ORAL
  Filled 2022-07-11 (×25): qty 1

## 2022-07-11 MED ORDER — QUETIAPINE FUMARATE 50 MG PO TABS: 25.0000 mg | ORAL_TABLET | Freq: Every day | ORAL | Status: DC

## 2022-07-11 MED ORDER — GABAPENTIN 100 MG PO CAPS
200.0000 mg | ORAL_CAPSULE | Freq: Every day | ORAL | Status: DC
  Administered 2022-07-11 – 2022-07-26 (×16): 200 mg via ORAL
  Filled 2022-07-11 (×16): qty 2

## 2022-07-11 MED ORDER — AMLODIPINE BESYLATE 10 MG PO TABS
10.0000 mg | ORAL_TABLET | Freq: Every morning | ORAL | Status: DC
  Administered 2022-07-11 – 2022-07-27 (×17): 10 mg via ORAL
  Filled 2022-07-11 (×17): qty 1

## 2022-07-11 MED ORDER — LACTATED RINGERS IV BOLUS
1000.0000 mL | Freq: Three times a day (TID) | INTRAVENOUS | Status: AC | PRN
  Administered 2022-07-11: 1000 mL via INTRAVENOUS

## 2022-07-11 MED ORDER — CALCIUM POLYCARBOPHIL 625 MG PO TABS
625.0000 mg | ORAL_TABLET | Freq: Two times a day (BID) | ORAL | Status: DC
  Administered 2022-07-11 – 2022-07-21 (×22): 625 mg via ORAL
  Filled 2022-07-11 (×22): qty 1

## 2022-07-11 MED ORDER — LORAZEPAM 0.5 MG PO TABS
0.5000 mg | ORAL_TABLET | Freq: Two times a day (BID) | ORAL | Status: DC | PRN
  Administered 2022-07-11 – 2022-07-21 (×10): 0.5 mg via ORAL
  Filled 2022-07-11 (×10): qty 1

## 2022-07-11 MED ORDER — CHLORPROMAZINE HCL 25 MG/ML IJ SOLN
25.0000 mg | Freq: Three times a day (TID) | INTRAMUSCULAR | Status: DC | PRN
  Administered 2022-07-11 – 2022-07-23 (×8): 25 mg via INTRAMUSCULAR
  Filled 2022-07-11 (×14): qty 1

## 2022-07-11 NOTE — Progress Notes (Signed)
Patient was able to tolerate sitting in the recliner for over two hours before reporting and increase in pain. Patient was assisted back into bed and given pain medication, which resolved all complaints of pain. Minimal hiccupping, and anxiety. Pt still able to tolerate very little food by mouth but is not getting nauseous when taking PO medication.

## 2022-07-11 NOTE — Progress Notes (Signed)
PROGRESS NOTE  Thomas [name] [id number] DOB: [birth year] DOA: 07/01/2022 PCP: [name], NP  HPI/Recap of past 24 hours: Thomas [name] is a 67 y.o. male with past medical history of CKD stage IIIb, diabetes mellitus, hypertension, hyperlipidemia, colon cancer diagnosed in 2022, dysphagia, brain tumor during 2018, acute ischemic left MCA stroke in 2015, history of seizure disorder presents with a complex surgical history was admitted for an elective robotic ostomy takedown with lysis of adhesions on February 22, postoperatively noted to have right lower lobe collapse and hypoxemia. Moved to stepdown on February 24 for management and has remained in stepdown due to associated complications.  Has been followed by PCCM while in stepdown.  PCCM is stabilized his respiratory state and now currently on room air.  [facility] hospital[facility] consulted to assist in managing his other medical comorbidities.     Today, met patient up in the chair, looks much better, denies any new complaints, denies any worsening abdominal pain, denies any nausea/vomiting, chest pain, fever/chills.  Still with liquid stools.   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   Diabetic polyneuropathy associated with type 2 diabetes mellitus ([place])   History of ischemic left MCA stroke 2015   Colon cancer ([place])   Protein-calorie malnutrition, moderate (HCC)   Epilepsy ([place])   Hyperlipidemia   Gastroesophageal reflux disease without esophagitis   Delirium   Hypokalemia   History of meningioma of the brain   Incisional hernia, without obstruction or gangrene   CKD (chronic kidney disease) stage 3, GFR 30-59 ml/min (HCC)   Insomnia due to medical condition   Hypophosphatemia    Hiccoughs   S/p lysis of adhesions, colostomy takedown Ileus Further management as per general surgery who is the primary attending   Acute hypoxic respiratory failure likely 2/2 mucous plugging right lower lobe with collapse- Resolved Noted dysphagia with aspiration events on 2/23 Currently now on room air, saturating well Continue clear liquid diet, TPN, no oral meds for now as per general surgery Pulmonary hygiene, incentive spirometer/flutter   Leukocytosis Currently afebrile Repeat CT abdomen/pelvis with no abscess or delayed leak UA unremarkable BC X 2 pending Procalcitonin pending Daily cbc   CKD stage IIIb Creatinine with a slight bump Bladder scan to ruled out urinary retention as output has been poor Daily BMP   History of hypertension BP uncontrolled Restart PO amlodipine, continue IV hydralazine/labetalol as needed    Diabetes mellitus type 2 Last A1c 5.1 Diet, Accu-Cheks, hypoglycemic protocol   History of CVA History of brain mass Seizure disorder Continue Keppra   GERD Continue PPI   Noted possible hospital-acquired delirium Anxiety Seroquel prn at bedtime should be considered first before using haldol Continue as needed Ativan Delirium precautions    Malnutrition Type:  Nutrition Problem: Moderate Malnutrition Etiology: chronic illness (transverse colon mass)   Malnutrition Characteristics:  Signs/Symptoms: moderate fat depletion, moderate muscle depletion   Nutrition Interventions:  Interventions: Refer to RD note for recommendations, TPN    Estimated body mass index is 28.46 kg/m as calculated from the following:   Height as of this encounter: '5\' 8"'$  (1.727 m).   Weight as of this encounter: 84.9 kg.     Code Status: Full  Family Communication: None at bedside  Disposition Plan: Status is: Inpatient Remains inpatient appropriate because:  Level of care      Consultants: [facility]  Procedures: S/p lysis of adhesions,  colostomy takedown  Antimicrobials: None  DVT prophylaxis:  lovenox  Objective: Vitals:   07/11/22 0812 07/11/22 1218 07/11/22 1400 07/11/22 1533  BP: (!) 172/80  (!) 172/69   Pulse: 87  83   Resp: (!) 25  (!) 25   Temp:  99.3 F (37.4 C)  98.6 F (37 C)  TempSrc:  Oral  Oral  SpO2: 100%  98%   Weight:      Height:        Intake/Output Summary (Last 24 hours) at 07/11/2022 1619 Last data filed at 07/11/2022 1512 Gross per 24 hour  Intake 2943.86 ml  Output 1970 ml  Net 973.86 ml   Filed Weights   07/09/22 0400 07/10/22 0500 07/11/22 0442  Weight: 88.2 kg 87.9 kg 84.9 kg    Exam: General: NAD, chronically ill appearing  Cardiovascular: S1, S2 present Respiratory: CTAB Abdomen: Soft, +tender, +distended, bowel sounds present, dressing c/d/i Musculoskeletal: No bilateral pedal edema noted Skin: Normal Psychiatry: Fair mood     Data Reviewed: CBC: Recent Labs  Lab 07/07/22 0500 07/08/22 0259 07/09/22 0339 07/10/22 0657 07/11/22 0435  WBC 13.9* 16.0* 16.4* 15.9* 16.7*  HGB 8.6* 8.7* 9.6* 10.2* 9.1*  HCT 25.4* 25.4* 29.2* 31.1* 27.1*  MCV 77.7* 76.7* 78.9* 78.3* 77.4*  PLT 309 301 328 445* [id number]*   Basic Metabolic Panel: Recent Labs  Lab 07/06/22 0622 07/06/22 0830 07/07/22 0500 07/08/22 0259 07/08/22 2029 07/09/22 0339 07/10/22 0657 07/11/22 0435  NA 143  --   --  139  --  142 140 143  K 3.4*  --  3.5 3.4*  --  3.5 3.7 3.7  CL 108  --   --  108  --  110 110 114*  CO2 27  --   --  26  --  '25 23 22  '$ GLUCOSE 120*  --   --  110*  --  127* 148* 143*  BUN 18  --   --  16  --  18 23 29*  CREATININE 1.47*  --  1.56* 1.53*  --  1.58* 1.68* 1.68*  CALCIUM 7.9*  --   --  7.8*  --  7.9* 8.0* 8.2*  MG  --  2.1  --  2.0  --  1.8 2.3 2.4  PHOS  --   --   --  1.2* 2.7 3.0 3.2 3.1   GFR: Estimated Creatinine Clearance: 46.5 mL/min (A) (by C-G formula based on SCr of 1.68 mg/dL (H)). Liver Function Tests: Recent Labs  Lab 07/07/22 1006 07/09/22 0339  AST  22 22  ALT 19 22  ALKPHOS 54 52  BILITOT 0.9 0.8  PROT 5.8* 6.6  ALBUMIN 2.5* 2.6*   No results for input(s): "LIPASE", "AMYLASE" in the last 168 hours. Recent Labs  Lab 07/07/22 1006  AMMONIA 17   Coagulation Profile: No results for input(s): "INR", "PROTIME" in the last 168 hours. Cardiac Enzymes: No results for input(s): "CKTOTAL", "CKMB", "CKMBINDEX", "TROPONINI" in the last 168 hours. BNP (last 3 results) No results for input(s): "PROBNP" in the last 8760 hours. HbA1C: No results for input(s): "HGBA1C" in the last 72 hours. CBG: Recent Labs  Lab 07/10/22 1656 07/10/22 2313 07/11/22 0616 07/11/22 1217 07/11/22 1532  GLUCAP 139* 144* 151* 132* 137*   Lipid Profile: Recent Labs    07/09/22 0339  TRIG 105   Thyroid Function Tests: No results for  input(s): "TSH", "T4TOTAL", "FREET4", "T3FREE", "THYROIDAB" in the last 72 hours. Anemia Panel: No results for input(s): "VITAMINB12", "FOLATE", "FERRITIN", "TIBC", "IRON", "RETICCTPCT" in the last 72 hours. Urine analysis:    Component Value Date/Time   COLORURINE YELLOW 07/11/2022 1448   APPEARANCEUR HAZY (A) 07/11/2022 1448   LABSPEC 1.030 07/11/2022 1448   PHURINE 5.0 07/11/2022 1448   GLUCOSEU NEGATIVE 07/11/2022 1448   HGBUR SMALL (A) 07/11/2022 1448   BILIRUBINUR NEGATIVE 07/11/2022 1448   KETONESUR NEGATIVE 07/11/2022 1448   PROTEINUR 30 (A) 07/11/2022 1448   UROBILINOGEN 0.2 09/27/2014 0301   NITRITE NEGATIVE 07/11/2022 1448   LEUKOCYTESUR NEGATIVE 07/11/2022 1448   Sepsis Labs: '@LABRCNTIP'$ (procalcitonin:4,lacticidven:4)  ) Recent Results (from the past 240 hour(s))  MRSA Next Gen by PCR, Nasal     Status: None   Collection Time: 07/03/22  3:16 PM   Specimen: Nasal Mucosa; Nasal Swab  Result Value Ref Range Status   MRSA by PCR Next Gen NOT DETECTED NOT DETECTED Final    Comment: (NOTE) The GeneXpert MRSA Assay (FDA approved for NASAL specimens only), is one component of a comprehensive MRSA  colonization surveillance program. It is not intended to diagnose MRSA infection nor to guide or monitor treatment for MRSA infections. Test performance is not FDA approved in patients less than 1 years old. Performed at [facility], [place] [address], [place], [place] [address]       Studies: No results found.  Scheduled Meds:  allopurinol  100 mg Oral QHS   atorvastatin  20 mg Oral Daily   Chlorhexidine Gluconate Cloth  6 each Topical Daily   enoxaparin (LOVENOX) injection  40 mg Subcutaneous Q24H   gabapentin  100 mg Oral Daily   gabapentin  200 mg Oral QHS   heparin lock flush  500 Units Intracatheter Q30 days   insulin aspart  0-15 Units Subcutaneous Q6H   levETIRAcetam  500 mg Oral BID   lip balm   Topical BID   pantoprazole  40 mg Oral BID AC   polycarbophil  625 mg Oral BID   sodium chloride flush  10-40 mL Intracatheter Q12H    Continuous Infusions:  sodium chloride Stopped (07/10/22 1512)   sodium chloride Stopped (07/11/22 0753)   lactated ringers Stopped (07/11/22 1424)   TPN ADULT (ION) 80 mL/hr at 07/11/22 1512   TPN ADULT (ION)       LOS: 10 days     Alma Friendly, MD [facility]  If 7PM-7AM, please contact night-coverage [email or web address] 07/11/2022, 4:19 PM

## 2022-07-11 NOTE — Progress Notes (Addendum)
PHARMACY - TOTAL PARENTERAL NUTRITION CONSULT NOTE   Indication: Prolonged ileus  Patient Measurements: Height: '5\' 8"'$  (172.7 cm) Weight: 84.9 kg (187 lb 2.7 oz) IBW/kg (Calculated) : 68.4 TPN AdjBW (KG): 73.1 Body mass index is 28.46 kg/m. Usual Weight:   Assessment: 67 yo male with hx colon cancer diagnosed 2022 s/p transverse colectomy admitted for elective robotic ostomy takedown with lysis of adhesions, incarcerated incisional hernia repair on 07/01/22. Post-op course complicated by right lower lobe collapse and hypoxemia, transferred to stepdown on 07/03/22 for management.  Pharmacy consulted for TPN on 07/08/22 for prolonged ileus.  Glucose / Insulin: Hx DM2 A1c 5.1 (07/01/22) - CBG range 139-151, goal 100-180 - 8 units SSI required Electrolytes: All WNL, including CorrCa (9.3). Mg (2.4) on upper end of normal -Cl slightly elevated Renal: CKDIII. SCr ~baseline. BUN slightly elevated Hepatic: LFTs, TG, T.bili WNL. Alb low Intake / Output: -UOP: 450 mL (decreasing), NG output: 510 mL, stool: 2110 mL -I/O Net: -708 mL MIVF: NS @ KVO GI Imaging: - 2/27 AXR: Previous postoperative pneumoperitoneum is less distinct and may have resolved or at least partially resolved. -2/29 Abdominal: Moderate dilation of small bowel loops, suggesting ileus. Linear collection of air adjacent to the right hemidiaphragm. Possibility of pneumoperitoneum is not excluded.  -3/1 CT Abd: No evidence of leak or abscess GI Surgeries / Procedures:  - 2/22: robotic colostomy takedown and lysis of adhesions  Central access: implanted port. Double lumen PICC placed 3/2 TPN start date: 2/29  Nutritional Goals: Goal TPN rate is 100 mL/hr (provides 130 g of protein, 360 g dextrose, and 2486 kcals per day)  RD Assessment: pending Estimated Needs Total Energy Estimated Needs: 2400-2600 kcals Total Protein Estimated Needs: 125-140 grams Total Fluid Estimated Needs: >/= 2.4L  Current Nutrition:  Diet advanced  to DYS1  TPN  Plan:   At 1800: Increase TPN to goal rate of 100 mL/hr at 1800 Electrolytes in TPN: Reduce Mg, K Na 61mq/L, K 40 mEq/L, Ca 044m/L, Mg 46m346mL, and Phos 49m446mL.  Cl:Ac 1:2 Add standard MVI and trace elements to TPN Thiamine x 5 days (3/1 > 3/5) Continue mSSI q6h [facility]. Monitor TPN labs on Mon/Thurs.   MaryLenis NoonarmD 07/11/22 7:24 AM

## 2022-07-11 NOTE — Progress Notes (Addendum)
Thomas [name] [id number] Feb 12, 1964  CARE TEAM:  PCP: [name], NP  Outpatient Care Team: Patient Care Team: [name], NP as PCP - General Benito Mccreedy, MD (Internal Medicine) Patient, No Pcp Per (General Practice) Michael Boston, MD as Consulting Physician (Colon and Rectal Surgery) Irene Shipper, MD as Consulting Physician (Gastroenterology) Ladell Pier, MD as Consulting Physician (Oncology)  Inpatient Treatment Team: Treatment Team: Attending Provider: Michael Boston, MD; Rounding Team: Garner Gavel, MD; Consulting Physician: Alma Friendly, MD; Registered Nurse: Benson Norway, RN; Charge Nurse: Kathy Breach, RN; Registered Nurse: Marlene Bast, RN; Registered Nurse: Linward Headland, RN; Pharmacist: Lenis Noon, [facility]   Problem List:   Principal Problem:   History of colorectal cancer Active Problems:   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   History of ischemic left MCA stroke 2015   Type 2 diabetes mellitus with diabetic nephropathy, without long-term current use of insulin ([place])   Hypertension   Diabetic polyneuropathy associated with type 2 diabetes mellitus ([place])   Colon cancer ([place])   Protein-calorie malnutrition, moderate ([place])   Epilepsy ([place])   Hyperlipidemia   Gastroesophageal reflux disease without esophagitis   Delirium   Hypokalemia   History of meningioma of the brain   Incisional hernia, without obstruction or gangrene   CKD (chronic kidney disease) stage 3, GFR 30-59 ml/min (HCC)   Insomnia due to medical condition   Hypophosphatemia   Hiccoughs   10 Days Post-Op  07/01/2022  POST-OPERATIVE DIAGNOSIS:   COLOSTOMY FOR COLON RESECTION INCARCERATED INCISIONAL HERNIA   PROCEDURE:   TAKEDOWN OF END COLOSTOMY WITH ANASTOMOSIS INTRAOPERATIVE ASSESSMENT OF TISSUE VASCULAR PERFUSION USING ICG (indocyanine green) IMMUNOFLUORESCENCE TRANSVERSUS ABDOMINIS PLANE (TAP) BLOCK -  BILATERAL LYSIS OF ADHESIONS x90 MINUTES (50% OF CASE) PRIMARY REPAIR OF INCARCERATED INCISIONAL HERNIA   SURGEON:  Adin Hector, MD  OR FINDINGS:    Patient had very dense intra-abdominal adhesions of small bowel to all quadrants of the abdomen as well as colon.  He had a loop of small bowel incarcerated in a periumbilical midline incisional hernia 4 x 4 cm.  Reduced and primarily repaired.   No obvious metastatic disease on visceral parietal peritoneum or liver.   It is an isoperistaltic ileocolonic anastomosis (proximal transverse colon to distal transverse colon) that rests in the epigastric region.    Assessment  Postop ileus with aspiration pneumonitis.  [facility] Stay = 10 days)  Plan:  NG tube with low output and tolerating clears.  Pulled NG tube.  Seemed somewhat foreshortened and migrated anyway.  Will try dysphagia 1 diet given concerns of aspiration risk.  See if speech therapy can reengage.  Still with a lot of post ileus diarrhea.  Per nursing wishes, will keep Flexi-Seal for now given diarrhea between enteral contrast enema and ileus somewhat resolving.  Hopefully get out tomorrow.  Doubt C. difficile.  If worsening leukocytosis or increasing concerns can check but hopefully not likely.  CT scan shows no abscess undrained fluid collection or delayed leak.  Lung improved.  Stop antibiotics and follow.  Some delirium with anxiety.  That seems to have stabilized with lorazepam and Haldol as needed.  I think his digestive tract is working now so we will transition back to his usual oral medications for pain and anxiety and seizures.  Per nursing request we will try Thorazine for occasional hiccups although that seems less likely.  They feel Haldol at bedtime is helping.  He  is usually not on anything like that so maybe we can wean him off that.  Hopefully once he is back on his usual oral medicines things may stabilize.  Would be very helpful for [facility]  hospitalist/internal medicine to help troubleshoot.   TPN for some developing malnutrition.  Hopefully can wean off in a few days if he eats adequately.  I think he is someone I would want to do calorie counts on first.  Maybe start tomorrow if he has decent p.o. attempts.  Follow electrolytes closely.  Awaiting a.m. labs.  He has had issues with hypokalemia & hypophosphatemia.    Chronic kidney disease but nonoliguric.  Removed with condom cath as needed and I&O as needed.  Place Foley on second I&O attempt.  IV antibiotics.  Zosyn 2/24 - 3/2.  Despite simmering leukocytosis, we have no proof of any infection in the abdomen lung or pelvis or abdominal wall.  Patient did have a yeast urinary tract infection with his prior abdominal catastrophe emergency surgery in 2022.  Recheck to make sure that it is not a source.  Pulmonary status seems to be stable.   Nebulizers as needed.  Medicine following.  Some acute blood loss anemia in setting of chronic anemia of chronic disease and iron deficiency anemia.  Concerns by patient's wife and some blood noted and NG tube residual checks.  Noted that can happen.  NG tube out.  Convert PPI to oral route.  Hgb stable.  Not hypotensive.  Follow.  -VTE prophylaxis- SCDs, etc  -mobilize as tolerated to help recovery.  Rather deconditioned but was able to mobilize in a wheelchair and up to the chair several times yesterday.  Hopefully then she He will be more mobilizing.  Hopefully with Flexi-Seal can come out that will be even better.   Disposition:  The patient is from: Home  Anticipate discharge to:  [facility] (SNF)  Anticipated Date of Discharge is:  March 8,2024    Barriers to discharge:  Pending Clinical improvement (more likely than not)  Patient currently is NOT MEDICALLY STABLE for discharge from the hospital from a surgery standpoint.   I updated the patient's status to the patient and nurse at bedside & ICU RNs just outside  room.  Recommendations were made.  Questions were answered.  They expressed understanding & appreciation.    I reviewed nursing notes, last 24 h vitals and pain scores, last 48 h intake and output, last 24 h labs and trends, and last 24 h imaging results.  Reviewed notes with respiratory physical therapies as well as critical care nursing.  I have reviewed this patient's available data, including medical history, events of note, test results, etc as part of my evaluation.  A significant portion of that time was spent in counseling.  Care during the described time interval was provided by me.  This care required moderate level of medical decision making.  07/11/2022    Subjective: (Chief complaint) Patient had better night.  Nausea and hiccuping less.  Tolerated some liquids.  A lot of watery output into Flexi-Seal.  Denies abdominal pain.  Objective:  Vital signs:  Vitals:   07/11/22 0500 07/11/22 0600 07/11/22 0700 07/11/22 0704  BP: (!) 149/67 (!) 168/64 137/80   Pulse: 77 80 83   Resp: (!) '25 16 11   '$ Temp:      TempSrc:      SpO2: 94% 98% 100% (!) 7%  Weight:      Height:  Last BM Date : 07/10/22  Intake/Output   Yesterday:  03/02 0701 - 03/03 0700 In: 2361.7 [I.V.:1723.5; NG/GT:120; IV Piggyback:458.3] Out: 3070 [Urine:450; Emesis/NG output:510; Stool:2110] This shift:  No intake/output data recorded.  Bowel function:  Flatus: No  BM:  No  Drain:  Nasogastric tube with lightly bilious tinged effluent.  Rather thin.   Physical Exam:  General: Pt being but awakens in no acute distress.  More alert and calm.  No hiccuping or catching. Eyes: PERRL, normal EOM.  Sclera clear.  No icterus Neuro: CN II-XII intact w/o focal sensory/motor deficits. Lymph: No head/neck/groin lymphadenopathy Psych:  No delerium/psychosis/paranoia.  Oriented x 4.  No confusion or anxiety today. HENT: Normocephalic, Mucus membranes moist.  No thrush Neck: Supple, No tracheal  deviation.  No obvious thyromegaly Chest: No pain to chest wall compression.  Good respiratory excursion.  No audible wheezing CV:  Pulses intact.  Regular rhythm.  No major extremity edema MS: Normal AROM mjr joints.  No obvious deformity  Abdomen: Soft.  Mildy distended.  Nontender .  Right upper quadrant old colostomy wound closed and dry.  No purulence or cellulitis.  No guarding nor evidence of peritonitis.  No incarcerated hernias.  Rectal: Flexi-Seal in place with thin gray/brown effluent Ext:   No deformity.  No mjr edema.  No cyanosis Skin: No petechiae / purpurea.  No major sores.  Warm and dry    Results:   Cultures: Recent Results (from the past 720 hour(s))  MRSA Next Gen by PCR, Nasal     Status: None   Collection Time: 07/03/22  3:16 PM   Specimen: Nasal Mucosa; Nasal Swab  Result Value Ref Range Status   MRSA by PCR Next Gen NOT DETECTED NOT DETECTED Final    Comment: (NOTE) The GeneXpert MRSA Assay (FDA approved for NASAL specimens only), is one component of a comprehensive MRSA colonization surveillance program. It is not intended to diagnose MRSA infection nor to guide or monitor treatment for MRSA infections. Test performance is not FDA approved in patients less than 32 years old. Performed at [facility], [place] [address], [place], [place] [address]     Labs: Results for orders placed or performed during the hospital encounter of 07/01/22 (from the past 48 hour(s))  Glucose, capillary     Status: Abnormal   Collection Time: 07/09/22  8:07 AM  Result Value Ref Range   Glucose-Capillary 128 (H) 70 - 99 mg/dL    Comment: Glucose reference range applies only to samples taken after fasting for at least 8 hours.   Comment 1 Notify RN   Glucose, capillary     Status: Abnormal   Collection Time: 07/09/22 11:19 AM  Result Value Ref Range   Glucose-Capillary 120 (H) 70 - 99 mg/dL    Comment: Glucose reference range applies only to samples  taken after fasting for at least 8 hours.  Glucose, capillary     Status: Abnormal   Collection Time: 07/09/22  4:28 PM  Result Value Ref Range   Glucose-Capillary 130 (H) 70 - 99 mg/dL    Comment: Glucose reference range applies only to samples taken after fasting for at least 8 hours.   Comment 1 Notify RN   Glucose, capillary     Status: Abnormal   Collection Time: 07/09/22  7:46 PM  Result Value Ref Range   Glucose-Capillary 109 (H) 70 - 99 mg/dL    Comment: Glucose reference range applies only to samples taken after fasting  for at least 8 hours.  Glucose, capillary     Status: Abnormal   Collection Time: 07/10/22 12:01 AM  Result Value Ref Range   Glucose-Capillary 170 (H) 70 - 99 mg/dL    Comment: Glucose reference range applies only to samples taken after fasting for at least 8 hours.  Glucose, capillary     Status: Abnormal   Collection Time: 07/10/22  3:28 AM  Result Value Ref Range   Glucose-Capillary 141 (H) 70 - 99 mg/dL    Comment: Glucose reference range applies only to samples taken after fasting for at least 8 hours.  CBC     Status: Abnormal   Collection Time: 07/10/22  6:57 AM  Result Value Ref Range   WBC 15.9 (H) 4.0 - 10.5 K/uL   RBC 3.97 (L) 4.22 - 5.81 MIL/uL   Hemoglobin 10.2 (L) 13.0 - 17.0 g/dL   HCT 31.1 (L) 39.0 - 52.0 %   MCV 78.3 (L) 80.0 - 100.0 fL   MCH 25.7 (L) 26.0 - 34.0 pg   MCHC 32.8 30.0 - 36.0 g/dL   RDW 15.4 11.5 - 15.5 %   Platelets 445 (H) 150 - 400 K/uL   nRBC 0.0 0.0 - 0.2 %    Comment: Performed at [facility], [place] [address], [place], [place] [id number]  Basic metabolic panel     Status: Abnormal   Collection Time: 07/10/22  6:57 AM  Result Value Ref Range   Sodium 140 135 - 145 mmol/L   Potassium 3.7 3.5 - 5.1 mmol/L   Chloride 110 98 - 111 mmol/L   CO2 23 22 - 32 mmol/L   Glucose, Bld 148 (H) 70 - 99 mg/dL    Comment: Glucose reference range applies only to samples taken after fasting for at least 8 hours.    BUN 23 8 - 23 mg/dL   Creatinine, Ser 1.68 (H) 0.61 - 1.24 mg/dL   Calcium 8.0 (L) 8.9 - 10.3 mg/dL   GFR, Estimated 45 (L) >60 mL/min    Comment: (NOTE) Calculated using the CKD-EPI Creatinine Equation (2021)    Anion gap 7 5 - 15    Comment: Performed at [facility], [place] [address] [place], [place] [address]  Magnesium     Status: None   Collection Time: 07/10/22  6:57 AM  Result Value Ref Range   Magnesium 2.3 1.7 - 2.4 mg/dL    Comment: Performed at [facility], [place] [address] [place], [place] [address]  Phosphorus     Status: None   Collection Time: 07/10/22  6:57 AM  Result Value Ref Range   Phosphorus 3.2 2.5 - 4.6 mg/dL    Comment: Performed at [facility], [place] [address], [place],  [address]  Glucose, capillary     Status: Abnormal   Collection Time: 07/10/22 11:51 AM  Result Value Ref Range   Glucose-Capillary 147 (H) 70 - 99 mg/dL    Comment: Glucose reference range applies only to samples taken after fasting for at least 8 hours.   Comment 1 Notify RN    Comment 2 Document in Chart   Glucose, capillary     Status: Abnormal   Collection Time: 07/10/22  4:56 PM  Result Value Ref Range   Glucose-Capillary 139 (H) 70 - 99 mg/dL    Comment: Glucose reference range applies only to samples taken after fasting for at least 8 hours.   Comment 1 Notify RN  Comment 2 Document in Chart   Glucose, capillary     Status: Abnormal   Collection Time: 07/10/22 11:13 PM  Result Value Ref Range   Glucose-Capillary 144 (H) 70 - 99 mg/dL    Comment: Glucose reference range applies only to samples taken after fasting for at least 8 hours.  CBC     Status: Abnormal   Collection Time: 07/11/22  4:35 AM  Result Value Ref Range   WBC 16.7 (H) 4.0 - 10.5 K/uL   RBC 3.50 (L) 4.22 - 5.81 MIL/uL   Hemoglobin 9.1 (L) 13.0 - 17.0 g/dL   HCT 27.1 (L) 39.0 - 52.0 %   MCV 77.4 (L) 80.0 - 100.0 fL   MCH 26.0 26.0 - 34.0 pg    MCHC 33.6 30.0 - 36.0 g/dL   RDW 15.3 11.5 - 15.5 %   Platelets 454 (H) 150 - 400 K/uL   nRBC 0.0 0.0 - 0.2 %    Comment: Performed at [facility], [place] [address] [place], [place] [id number]  Basic metabolic panel     Status: Abnormal   Collection Time: 07/11/22  4:35 AM  Result Value Ref Range   Sodium 143 135 - 145 mmol/L   Potassium 3.7 3.5 - 5.1 mmol/L   Chloride 114 (H) 98 - 111 mmol/L   CO2 22 22 - 32 mmol/L   Glucose, Bld 143 (H) 70 - 99 mg/dL    Comment: Glucose reference range applies only to samples taken after fasting for at least 8 hours.   BUN 29 (H) 8 - 23 mg/dL   Creatinine, Ser 1.68 (H) 0.61 - 1.24 mg/dL   Calcium 8.2 (L) 8.9 - 10.3 mg/dL   GFR, Estimated 45 (L) >60 mL/min    Comment: (NOTE) Calculated using the CKD-EPI Creatinine Equation (2021)    Anion gap 7 5 - 15    Comment: Performed at [facility], [place] [address] [place], [place] [address]  Magnesium     Status: None   Collection Time: 07/11/22  4:35 AM  Result Value Ref Range   Magnesium 2.4 1.7 - 2.4 mg/dL    Comment: Performed at [facility], [place] [address], [place], [place] [address]  Phosphorus     Status: None   Collection Time: 07/11/22  4:35 AM  Result Value Ref Range   Phosphorus 3.1 2.5 - 4.6 mg/dL    Comment: Performed at [facility], [place] [address] [place], [place] [address]  Glucose, capillary     Status: Abnormal   Collection Time: 07/11/22  6:16 AM  Result Value Ref Range   Glucose-Capillary 151 (H) 70 - 99 mg/dL    Comment: Glucose reference range applies only to samples taken after fasting for at least 8 hours.    Imaging / Studies: CT ABDOMEN PELVIS WO CONTRAST  Result Date: 07/09/2022 CLINICAL DATA:  Evaluate for possible postoperative leak EXAM: CT ABDOMEN AND PELVIS WITHOUT CONTRAST TECHNIQUE: Multidetector CT imaging of the abdomen and pelvis was performed following the standard protocol without  IV contrast. RADIATION DOSE REDUCTION: This exam was performed according to the departmental dose-optimization program which includes automated exposure control, adjustment of the mA and/or kV according to patient size and/or use of iterative reconstruction technique. COMPARISON:  CT from earlier in the same day. FINDINGS: Lower chest: Small bilateral pleural effusions are noted. The chest is not completely evaluated in this exam. Hepatobiliary: The visualized liver and gallbladder are within normal  limits. The previously seen hypodense lesion is not included on this exam. Pancreas: Unremarkable. No pancreatic ductal dilatation or surrounding inflammatory changes. Spleen: Normal in size without focal abnormality. Adrenals/Urinary Tract: Adrenal glands are within normal limits. Contrast is noted within the renal collecting systems from the recent CT examination. Simple appearing cysts are noted in the kidneys bilaterally. No follow-up is recommended. The bladder is well distended with opacified and unopacified urine as well as air similar to that seen on the prior CT examination. Stomach/Bowel: Rectal tube is noted in place with installation of rectal contrast which courses in a retrograde fashion towards the recent transverse colon anastomosis. Contrast material passes through the anastomosis into the more proximal transverse colon. No leakage of contrast is identified to suggest postoperative leak. Some persistent free air is noted similar to that seen on the prior exam. The visualized small bowel loops again are mildly prominent but stable in appearance from the earlier study. Again this likely represents a postoperative ileus. Stomach shows a gastric catheter within. No other focal abnormality is noted. Vascular/Lymphatic: Aortic atherosclerosis. Stable appearing iliac lymph nodes are noted similar to that seen earlier in the same day. Reproductive: Prostate is unremarkable. Other: Free air is again identified as  previously described. Fat containing inguinal hernias are noted bilaterally left greater than right Musculoskeletal: Changes of prior colostomy are noted in the anterior abdominal wall on the right. No bony abnormality is noted. IMPRESSION: Stable postoperative changes without evidence of postoperative leak. No perianastomotic contrast is noted. No definitive fluid collection is seen. Persistent mild small bowel dilatation again likely favoring a postoperative ileus. Stable free air from prior surgery. Remainder of the exam is stable from the study obtained 4 hours previous. Electronically Signed   By: Inez Catalina M.D.   On: 07/09/2022 19:29   CT ABDOMEN PELVIS W CONTRAST  Addendum Date: 07/09/2022   ADDENDUM REPORT: 07/09/2022 15:05 ADDENDUM: Not mentioned in the original impression: There is a indeterminate hypodense lesion within segment 7 of the liver, which was not seen on previous imaging. In a patient who has a history of malignancy a small metastasis to the liver cannot be excluded. When the patient is clinically stable, able to remain motionless and breath hold (ideally as an outpatient) consider more definitive characterization with contrast enhanced liver MRI. Electronically Signed   By: Kerby Moors M.D.   On: 07/09/2022 15:05   Result Date: 07/09/2022 CLINICAL DATA:  Abdominal pain status post takedown of in colostomy with anastomosis. Postop ileus. Evaluate for leak or abscess. EXAM: CT ABDOMEN AND PELVIS WITH CONTRAST TECHNIQUE: Multidetector CT imaging of the abdomen and pelvis was performed using the standard protocol following bolus administration of intravenous contrast. RADIATION DOSE REDUCTION: This exam was performed according to the departmental dose-optimization program which includes automated exposure control, adjustment of the mA and/or kV according to patient size and/or use of iterative reconstruction technique. CONTRAST:  112m OMNIPAQUE IOHEXOL 300 MG/ML  SOLN COMPARISON:   03/05/2022 FINDINGS: Lower chest: There are small bilateral pleural effusions with overlying compressive type atelectasis. Hepatobiliary: Within the right lobe of liver, segment 7 there is a 1.2 cm ill-defined low-density structure measuring 1.2 cm, image 20/3. Gallbladder appears normal. No bile duct dilatation. Pancreas: Unremarkable. No pancreatic ductal dilatation or surrounding inflammatory changes. Spleen: Normal in size without focal abnormality. Adrenals/Urinary Tract: Normal adrenal glands. No nephrolithiasis or hydronephrosis. Bilateral simple appearing kidney cysts measure up to 4.1 cm, image 45/3. No follow-up imaging recommended. There is a small  amount of gas within the non dependent portion of the bladder. Bladder is otherwise unremarkable. Stomach/Bowel: Nasogastric tube is identified within the stomach. Enteric contrast material is identified within the stomach and proximal small bowel loops. No significant contrast is identified within the distal small bowel. Within the anterior ventral abdomen there are signs of enterocolonic anastomosis, image 38/3. The small bowel loops proximal to the anastomosis are increased in caliber with scattered air-fluid levels measuring up to 3.8 cm, image 56/3. Lack of contrast material within the distal small bowel and proximal colon limits assessment for a anastomotic dehiscence. The colon distal to the anastomosis is decreased in caliber. Vascular/Lymphatic: Aortic atherosclerosis. No aneurysm. Mild diffuse mesenteric edema. Small bilateral pelvic lymph nodes are identified measuring up to 8 mm. No adenopathy identified. Reproductive: Prostate is unremarkable. Other: There is a small volume of residual pneumoperitoneum best noted along the undersurface of the right hemidiaphragm, image 24/3. No convincing evidence for loculated fluid collection. There is a small volume of free fluid within the abdomen and pelvis. Musculoskeletal: Within the right lower quadrant  scratch set within the right ventral abdominal wall at the prior ostomy site 2 small hematomas are noted which measure up to 2.2 cm, image 47/3. No acute or suspicious osseous findings. IMPRESSION: 1. There are signs of enterocolonic anastomosis within the anterior ventral abdomen. Lack of contrast material within the distal small bowel and proximal colon limits assessment for a anastomotic dehiscence. Within this limitation, there is no convincing evidence for anastomotic dehiscence or perianastomotic fluid collection. If there is a continued concern for anastomotic dehiscence repeat CT AP with rectal contrast material may be helpful for further investigation. 2. The small bowel loops proximal to the anastomosis are increased in caliber with scattered air-fluid levels measuring up to 3.8 cm. Imaging findings are favored to represent postoperative ileus. 3. Small volume of residual pneumoperitoneum best noted along the undersurface of the right hemidiaphragm. 4. No convincing evidence for loculated fluid collection to suggest abscess. 5. Small volume of free fluid within the abdomen and pelvis. 6. Small bilateral pleural effusions with overlying compressive type atelectasis. 7. Small amount of gas within the non dependent portion of the bladder. Likely related instrumentation 8. Small hematomas within the right ventral abdominal wall at the prior ostomy site. Likely postoperative. 9.  Aortic Atherosclerosis (ICD10-I70.0). Electronically Signed: By: Kerby Moors M.D. On: 07/09/2022 14:48   [place] EKG SITE RITE  Result Date: 07/09/2022 If Site Rite image not attached, placement could not be confirmed due to current cardiac rhythm.   Medications / Allergies: per chart  Antibiotics: Anti-infectives (From admission, onward)    Start     Dose/Rate Route Frequency Ordered Stop   07/10/22 0400  piperacillin-tazobactam (ZOSYN) IVPB 3.375 g  Status:  Discontinued        3.375 g 12.5 mL/hr over 240 Minutes  Intravenous Every 8 hours 07/09/22 2040 07/10/22 0738   07/08/22 1100  piperacillin-tazobactam (ZOSYN) IVPB 3.375 g  Status:  Discontinued        3.375 g 12.5 mL/hr over 240 Minutes Intravenous Every 8 hours 07/08/22 1001 07/09/22 2040   07/08/22 1000  erythromycin 250 mg in sodium chloride 0.9 % 100 mL IVPB  Status:  Discontinued        250 mg 100 mL/hr over 60 Minutes Intravenous Every 12 hours 07/08/22 0739 07/09/22 0730   07/06/22 0915  erythromycin 250 mg in sodium chloride 0.9 % 100 mL IVPB  Status:  Discontinued  250 mg 100 mL/hr over 60 Minutes Intravenous Every 8 hours 07/06/22 0823 07/08/22 0739   07/03/22 1700  piperacillin-tazobactam (ZOSYN) IVPB 3.375 g        3.375 g 12.5 mL/hr over 240 Minutes Intravenous Every 8 hours 07/03/22 1612 07/08/22 0450   07/02/22 0000  cefoTEtan (CEFOTAN) 2 g in sodium chloride 0.9 % 100 mL IVPB        2 g 200 mL/hr over 30 Minutes Intravenous Every 12 hours 07/01/22 1737 07/02/22 0051   07/01/22 1400  neomycin (MYCIFRADIN) tablet 1,000 mg  Status:  Discontinued       See Hyperspace for full Linked Orders Report.   1,000 mg Oral 3 times per day 07/01/22 1054 07/01/22 1109   07/01/22 1400  metroNIDAZOLE (FLAGYL) tablet 1,000 mg  Status:  Discontinued       See Hyperspace for full Linked Orders Report.   1,000 mg Oral 3 times per day 07/01/22 1054 07/01/22 1109   07/01/22 1100  cefoTEtan (CEFOTAN) 2 g in sodium chloride 0.9 % 100 mL IVPB        2 g 200 mL/hr over 30 Minutes Intravenous On call to O.R. 07/01/22 1054 07/01/22 1204         Note: Portions of this report may have been transcribed using voice recognition software. Every effort was made to ensure accuracy; however, inadvertent computerized transcription errors may be present.   Any transcriptional errors that result from this process are unintentional.    Adin Hector, MD, FACS, MASCRS Esophageal, Gastrointestinal & Colorectal Surgery Robotic and Minimally Invasive  Surgery  [facility] [place]. [address] [place], [place] [id number] [phone] Fax [phone] Main  CONTACT INFORMATION:  Weekday (9AM-5PM): Call [facility] main office at [phone]  Weeknight (5PM-9AM) or Weekend/Holiday: Check [email or web address] (password " [email or web address]") for General Surgery [facility] coverage  (Please, do not use SecureChat as it is not reliable communication to reach operating surgeons for immediate patient care given surgeries/outpatient duties/clinic/cross-coverage/off post-call which would lead to a delay in care.  Epic staff messaging available for outptient concerns, but may not be answered for 48 hours or more).     07/11/2022  7:21 AM

## 2022-07-12 DIAGNOSIS — Z85048 Personal history of other malignant neoplasm of rectum, rectosigmoid junction, and anus: Secondary | ICD-10-CM | POA: Diagnosis not present

## 2022-07-12 LAB — GASTROINTESTINAL PANEL BY PCR, STOOL (REPLACES STOOL CULTURE)

## 2022-07-12 LAB — MAGNESIUM: Magnesium: 2.4 mg/dL (ref 1.7–2.4)

## 2022-07-12 LAB — PHOSPHORUS: Phosphorus: 3.2 mg/dL (ref 2.5–4.6)

## 2022-07-12 LAB — GLUCOSE, CAPILLARY
Glucose-Capillary: 143 mg/dL — ABNORMAL HIGH (ref 70–99)
Glucose-Capillary: 175 mg/dL — ABNORMAL HIGH (ref 70–99)
Glucose-Capillary: 151 mg/dL — ABNORMAL HIGH (ref 70–99)
Glucose-Capillary: 138 mg/dL — ABNORMAL HIGH (ref 70–99)

## 2022-07-12 LAB — COMPREHENSIVE METABOLIC PANEL
ALT: 72 U/L — ABNORMAL HIGH (ref 0–44)
AST: 46 U/L — ABNORMAL HIGH (ref 15–41)
Albumin: 2.7 g/dL — ABNORMAL LOW (ref 3.5–5.0)
Alkaline Phosphatase: 55 U/L (ref 38–126)
Anion gap: 8 (ref 5–15)
BUN: 37 mg/dL — ABNORMAL HIGH (ref 8–23)
CO2: 20 mmol/L — ABNORMAL LOW (ref 22–32)
Calcium: 8.3 mg/dL — ABNORMAL LOW (ref 8.9–10.3)
Chloride: 114 mmol/L — ABNORMAL HIGH (ref 98–111)
Creatinine, Ser: 1.59 mg/dL — ABNORMAL HIGH (ref 0.61–1.24)
GFR, Estimated: 48 mL/min — ABNORMAL LOW (ref >60)
Glucose, Bld: 110 mg/dL — ABNORMAL HIGH (ref 70–99)
Potassium: 4.1 mmol/L (ref 3.5–5.1)
Sodium: 142 mmol/L (ref 135–145)
Total Bilirubin: 0.5 mg/dL (ref 0.3–1.2)
Total Protein: 6.9 g/dL (ref 6.5–8.1)

## 2022-07-12 LAB — CBC WITH DIFFERENTIAL/PLATELET
Abs Immature Granulocytes: 0.37 10*3/uL — ABNORMAL HIGH (ref 0.00–0.07)
Basophils Absolute: 0.2 10*3/uL — ABNORMAL HIGH (ref 0.0–0.1)
Basophils Relative: 1 %
Eosinophils Absolute: 0.9 10*3/uL — ABNORMAL HIGH (ref 0.0–0.5)
Eosinophils Relative: 4 %
HCT: 26.3 % — ABNORMAL LOW (ref 39.0–52.0)
Hemoglobin: 8.9 g/dL — ABNORMAL LOW (ref 13.0–17.0)
Immature Granulocytes: 2 %
Lymphocytes Relative: 10 %
Lymphs Abs: 2.2 10*3/uL (ref 0.7–4.0)
MCH: 26.1 pg (ref 26.0–34.0)
MCHC: 33.8 g/dL (ref 30.0–36.0)
MCV: 77.1 fL — ABNORMAL LOW (ref 80.0–100.0)
Monocytes Absolute: 2.1 10*3/uL — ABNORMAL HIGH (ref 0.1–1.0)
Monocytes Relative: 10 %
Neutro Abs: 16 10*3/uL — ABNORMAL HIGH (ref 1.7–7.7)
Neutrophils Relative %: 73 %
Platelets: 466 10*3/uL — ABNORMAL HIGH (ref 150–400)
RBC: 3.41 MIL/uL — ABNORMAL LOW (ref 4.22–5.81)
RDW: 15.3 % (ref 11.5–15.5)
WBC: 21.6 10*3/uL — ABNORMAL HIGH (ref 4.0–10.5)
nRBC: 0 % (ref 0.0–0.2)

## 2022-07-12 LAB — C DIFFICILE (CDIFF) QUICK SCRN (NO PCR REFLEX)
C Diff antigen: NEGATIVE
C Diff interpretation: NOT DETECTED
C Diff toxin: NEGATIVE

## 2022-07-12 LAB — PROCALCITONIN: Procalcitonin: 0.24 ng/mL

## 2022-07-12 LAB — TRIGLYCERIDES: Triglycerides: 127 mg/dL (ref <150)

## 2022-07-12 MED ORDER — TRAVASOL 10 % IV SOLN
INTRAVENOUS | Status: AC
  Filled 2022-07-12: qty 1296

## 2022-07-12 NOTE — Progress Notes (Addendum)
Thomas [name] [id number] 10/05/1955  CARE TEAM:  PCP: [name], NP  Outpatient Care Team: Patient Care Team: [name], NP as PCP - Caprice Red, MD (Internal Medicine) Patient, No Pcp Per (General Practice) Michael Boston, MD as Consulting Physician (Colon and Rectal Surgery) Irene Shipper, MD as Consulting Physician (Gastroenterology) Ladell Pier, MD as Consulting Physician (Oncology)  Inpatient Treatment Team: Treatment Team: Attending Provider: Michael Boston, MD; Rounding Team: Garner Gavel, MD; Consulting Physician: Alma Friendly, MD; Registered Nurse: Marlene Bast, RN   Problem List:   Principal Problem:   History of colorectal cancer Active Problems:   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   History of ischemic left MCA stroke 2015   Type 2 diabetes mellitus with diabetic nephropathy, without long-term current use of insulin ([place])   Hypertension   Diabetic polyneuropathy associated with type 2 diabetes mellitus ([place])   Colon cancer ([place])   Protein-calorie malnutrition, moderate ([place])   Epilepsy ([place])   Hyperlipidemia   Gastroesophageal reflux disease without esophagitis   Delirium   Hypokalemia   History of meningioma of the brain   Incisional hernia, without obstruction or gangrene   CKD (chronic kidney disease) stage 3, GFR 30-59 ml/min (HCC)   Insomnia due to medical condition   Hypophosphatemia   Hiccoughs   11 Days Post-Op  07/01/2022  POST-OPERATIVE DIAGNOSIS:   COLOSTOMY FOR COLON RESECTION INCARCERATED INCISIONAL HERNIA   PROCEDURE:   TAKEDOWN OF END COLOSTOMY WITH ANASTOMOSIS INTRAOPERATIVE ASSESSMENT OF TISSUE VASCULAR PERFUSION USING ICG (indocyanine green) IMMUNOFLUORESCENCE TRANSVERSUS ABDOMINIS PLANE (TAP) BLOCK - BILATERAL LYSIS OF ADHESIONS x90 MINUTES (50% OF CASE) PRIMARY REPAIR OF INCARCERATED INCISIONAL HERNIA   SURGEON:  Adin Hector, MD  OR FINDINGS:     Patient had very dense intra-abdominal adhesions of small bowel to all quadrants of the abdomen as well as colon.  He had a loop of small bowel incarcerated in a periumbilical midline incisional hernia 4 x 4 cm.  Reduced and primarily repaired.   No obvious metastatic disease on visceral parietal peritoneum or liver.   It is an isoperistaltic ileocolonic anastomosis (proximal transverse colon to distal transverse colon) that rests in the epigastric region.    Assessment  Postop ileus with aspiration pneumonitis.  [facility] Stay = 11 days)  Plan:  Hemodynamically stable without decompensation.  I think it is time to try and get him to the floor to focus on therapies.  Dysphagia 1 diet given concerns of aspiration risk.  See if speech therapy can reengage.  Patient still with stomach hiccups -but less overall.  Not much appetite.  Hopefully will turn around.  DC Flexi-Seal since diarrhea seems to have tapered off.  Need to get it off him.  CT scan shows no abscess undrained fluid collection or delayed leak.  Lungs improved.  Stop antibiotics and follow.  ICU delirium with sundowning seems to be stabilizing.  Resuming oral medications for pain and anxiety and seizures.  As needed Thorazine for occasional hiccups.  They feel Haldol at bedtime is helping.  He is usually not on anything like that so maybe we can wean him off that.  Hopefully once he is back on his usual oral medicines things may stabilize.  Would be very helpful for [facility] hospitalist/internal medicine to help troubleshoot.   TPN for some developing malnutrition.  Hopefully can wean off in a few days if he eats adequately.  I think he is someone I would  want to do calorie counts on first.  Maybe start tomorrow if he has decent p.o. attempts.  Follow electrolytes closely. He has had issues with hypokalemia & hypophosphatemia.  Stable for now.  Chronic kidney disease but nonoliguric.  Try and keep on the dry side as  possible.  IV antibiotics.  Zosyn 2/24 - 3/2.  Despite simmering leukocytosis, we have no proof of any infection in the abdomen lung or pelvis or abdominal wall.  Patient did have a yeast urinary tract infection with his prior abdominal catastrophe emergency surgery in 2022.  Recheck UA hazy but no leukocytes/nitrite/yeast this time around.  Doubt UTI at this point.  Pulmonary status seems to be stable.   Nebulizers as needed.  Medicine following.  Some acute blood loss anemia in setting of chronic anemia of chronic disease and iron deficiency anemia.  Concerns by patient's wife and some blood noted and NG tube residual checks.  Noted that can happen.  NG tube out.  Convert PPI to oral route.  Hgb stable.  Not hypotensive.  Follow.  -VTE prophylaxis- SCDs, etc  -mobilize as tolerated to help recovery.  Rather deconditioned but was able to mobilize in a wheelchair and up to the chair several times yesterday.  Hopefully then she He will be more mobilizing.  Hopefully with Flexi-Seal can come out that will be even better.   Disposition:  The patient is from: Home  Anticipate discharge to:  [facility] (SNF)  Anticipated Date of Discharge is:  March 8,2024    Barriers to discharge:  Pending Clinical improvement (more likely than not)  Patient currently is NOT MEDICALLY STABLE for discharge from the hospital from a surgery standpoint.   I updated the patient's status to the patient and nurse at bedside & ICU RNs just outside room.  Recommendations were made.  Questions were answered.  They expressed understanding & appreciation.    I reviewed nursing notes, last 24 h vitals and pain scores, last 48 h intake and output, last 24 h labs and trends, and last 24 h imaging results.  Reviewed notes with respiratory physical therapies as well as critical care nursing.  I have reviewed this patient's available data, including medical history, events of note, test results, etc as part of  my evaluation.  A significant portion of that time was spent in counseling.  Care during the described time interval was provided by me.  This care required moderate level of medical decision making.  07/12/2022    Subjective: (Chief complaint)  Patient with some hiccups given Thorazine.  Tolerating some liquids but not much of an appetite.  No major events.  Objective:  Vital signs:  Vitals:   07/12/22 0010 07/12/22 0100 07/12/22 0200 07/12/22 0400  BP:  (!) 147/57 (!) 168/84 (!) 171/58  Pulse:  84 89 85  Resp:  (!) 25 20 (!) 22  Temp: 99 F (37.2 C)   98.7 F (37.1 C)  TempSrc: Axillary   Axillary  SpO2:  96% 97% 97%  Weight:      Height:        Last BM Date : 07/11/22  Intake/Output   Yesterday:  03/03 0701 - 03/04 0700 In: 2924.4 [I.V.:1931.9; IV Piggyback:992.4] Out: 1535 [Urine:790; Stool:745] This shift:  Total I/O In: 967.7 [I.V.:967.7] Out: 500 [Urine:500]  Bowel function:  Flatus: No  BM:  No  Drain:  Nasogastric tube with lightly bilious tinged effluent.  Rather thin.   Physical Exam:  General: Pt being  but awakens in no acute distress.  Calm & engaging in conversation.  No hiccuping or catching. Eyes: PERRL, normal EOM.  Sclera clear.  No icterus Neuro: CN II-XII intact w/o focal sensory/motor deficits. Lymph: No head/neck/groin lymphadenopathy Psych:  No delerium/psychosis/paranoia.  Oriented x 4.  No confusion or anxiety today. HENT: Normocephalic, Mucus membranes moist.  No thrush Neck: Supple, No tracheal deviation.  No obvious thyromegaly Chest: No pain to chest wall compression.  Good respiratory excursion.  No audible wheezing CV:  Pulses intact.  Regular rhythm.  No major extremity edema MS: Normal AROM mjr joints.  No obvious deformity  Abdomen: Soft.  Mildy distended.  Nontender .  Right upper quadrant old colostomy wound closed and dry.  No purulence or cellulitis.  No guarding nor evidence of peritonitis.  No incarcerated  hernias.  Rectal: Flexi-Seal in place with thin gray/brown effluent Ext:   No deformity.  No mjr edema.  No cyanosis Skin: No petechiae / purpurea.  No major sores.  Warm and dry    Results:   Cultures: Recent Results (from the past 720 hour(s))  MRSA Next Gen by PCR, Nasal     Status: None   Collection Time: 07/03/22  3:16 PM   Specimen: Nasal Mucosa; Nasal Swab  Result Value Ref Range Status   MRSA by PCR Next Gen NOT DETECTED NOT DETECTED Final    Comment: (NOTE) The GeneXpert MRSA Assay (FDA approved for NASAL specimens only), is one component of a comprehensive MRSA colonization surveillance program. It is not intended to diagnose MRSA infection nor to guide or monitor treatment for MRSA infections. Test performance is not FDA approved in patients less than 67 years old. Performed at [facility], [name] [address], [place], [place] [address]     Labs: Results for orders placed or performed during the hospital encounter of 07/01/22 (from the past 48 hour(s))  CBC     Status: Abnormal   Collection Time: 07/10/22  6:57 AM  Result Value Ref Range   WBC 15.9 (H) 4.0 - 10.5 K/uL   RBC 3.97 (L) 4.22 - 5.81 MIL/uL   Hemoglobin 10.2 (L) 13.0 - 17.0 g/dL   HCT 31.1 (L) 39.0 - 52.0 %   MCV 78.3 (L) 80.0 - 100.0 fL   MCH 25.7 (L) 26.0 - 34.0 pg   MCHC 32.8 30.0 - 36.0 g/dL   RDW 15.4 11.5 - 15.5 %   Platelets 445 (H) 150 - 400 K/uL   nRBC 0.0 0.0 - 0.2 %    Comment: Performed at [facility], [place] [address], [place], [place] [id number]  Basic metabolic panel     Status: Abnormal   Collection Time: 07/10/22  6:57 AM  Result Value Ref Range   Sodium 140 135 - 145 mmol/L   Potassium 3.7 3.5 - 5.1 mmol/L   Chloride 110 98 - 111 mmol/L   CO2 23 22 - 32 mmol/L   Glucose, Bld 148 (H) 70 - 99 mg/dL    Comment: Glucose reference range applies only to samples taken after fasting for at least 8 hours.   BUN 23 8 - 23 mg/dL   Creatinine, Ser 1.68  (H) 0.61 - 1.24 mg/dL   Calcium 8.0 (L) 8.9 - 10.3 mg/dL   GFR, Estimated 45 (L) >60 mL/min    Comment: (NOTE) Calculated using the CKD-EPI Creatinine Equation (2021)    Anion gap 7 5 - 15    Comment: Performed at [facility], [place] [address] [place], [place] [address]  Magnesium     Status: None   Collection Time: 07/10/22  6:57 AM  Result Value Ref Range   Magnesium 2.3 1.7 - 2.4 mg/dL    Comment: Performed at [facility], [place] [address] [place], [place] [address]  Phosphorus     Status: None   Collection Time: 07/10/22  6:57 AM  Result Value Ref Range   Phosphorus 3.2 2.5 - 4.6 mg/dL    Comment: Performed at [facility], [place] [address], [place], [place] [address]  Glucose, capillary     Status: Abnormal   Collection Time: 07/10/22 11:51 AM  Result Value Ref Range   Glucose-Capillary 147 (H) 70 - 99 mg/dL    Comment: Glucose reference range applies only to samples taken after fasting for at least 8 hours.   Comment 1 Notify RN    Comment 2 Document in Chart   Glucose, capillary     Status: Abnormal   Collection Time: 07/10/22  4:56 PM  Result Value Ref Range   Glucose-Capillary 139 (H) 70 - 99 mg/dL    Comment: Glucose reference range applies only to samples taken after fasting for at least 8 hours.   Comment 1 Notify RN    Comment 2 Document in Chart   Glucose, capillary     Status: Abnormal   Collection Time: 07/10/22 11:13 PM  Result Value Ref Range   Glucose-Capillary 144 (H) 70 - 99 mg/dL    Comment: Glucose reference range applies only to samples taken after fasting for at least 8 hours.  CBC     Status: Abnormal   Collection Time: 07/11/22  4:35 AM  Result Value Ref Range   WBC 16.7 (H) 4.0 - 10.5 K/uL   RBC 3.50 (L) 4.22 - 5.81 MIL/uL   Hemoglobin 9.1 (L) 13.0 - 17.0 g/dL   HCT 27.1 (L) 39.0 - 52.0 %   MCV 77.4 (L) 80.0 - 100.0 fL   MCH 26.0 26.0 - 34.0 pg   MCHC 33.6 30.0 - 36.0 g/dL   RDW 15.3 11.5  - 15.5 %   Platelets 454 (H) 150 - 400 K/uL   nRBC 0.0 0.0 - 0.2 %    Comment: Performed at [facility], [place] [address], [place], [place] [id number]  Basic metabolic panel     Status: Abnormal   Collection Time: 07/11/22  4:35 AM  Result Value Ref Range   Sodium 143 135 - 145 mmol/L   Potassium 3.7 3.5 - 5.1 mmol/L   Chloride 114 (H) 98 - 111 mmol/L   CO2 22 22 - 32 mmol/L   Glucose, Bld 143 (H) 70 - 99 mg/dL    Comment: Glucose reference range applies only to samples taken after fasting for at least 8 hours.   BUN 29 (H) 8 - 23 mg/dL   Creatinine, Ser 1.68 (H) 0.61 - 1.24 mg/dL   Calcium 8.2 (L) 8.9 - 10.3 mg/dL   GFR, Estimated 45 (L) >60 mL/min    Comment: (NOTE) Calculated using the CKD-EPI Creatinine Equation (2021)    Anion gap 7 5 - 15    Comment: Performed at [facility], [place] [address], [place], [place] [address]  Magnesium     Status: None   Collection Time: 07/11/22  4:35 AM  Result Value Ref Range   Magnesium 2.4 1.7 - 2.4 mg/dL    Comment: Performed at [facility], [place] [address] [place], [place] [address]  Phosphorus     Status: None   Collection Time: 07/11/22  4:35 AM  Result Value Ref Range   Phosphorus 3.1 2.5 - 4.6 mg/dL    Comment: Performed at [facility], [place] [address] [place], [place] [address]  Glucose, capillary     Status: Abnormal   Collection Time: 07/11/22  6:16 AM  Result Value Ref Range   Glucose-Capillary 151 (H) 70 - 99 mg/dL    Comment: Glucose reference range applies only to samples taken after fasting for at least 8 hours.  Glucose, capillary     Status: Abnormal   Collection Time: 07/11/22 12:17 PM  Result Value Ref Range   Glucose-Capillary 132 (H) 70 - 99 mg/dL    Comment: Glucose reference range applies only to samples taken after fasting for at least 8 hours.   Comment 1 Notify RN   Urinalysis, w/ Reflex to Culture (Infection Suspected) -Urine, Clean  Catch     Status: Abnormal   Collection Time: 07/11/22  2:48 PM  Result Value Ref Range   Specimen Source URINE, CLEAN CATCH    Color, Urine YELLOW YELLOW   APPearance HAZY (A) CLEAR   Specific Gravity, Urine 1.030 1.005 - 1.030   pH 5.0 5.0 - 8.0   Glucose, UA NEGATIVE NEGATIVE mg/dL   Hgb urine dipstick SMALL (A) NEGATIVE   Bilirubin Urine NEGATIVE NEGATIVE   Ketones, ur NEGATIVE NEGATIVE mg/dL   Protein, ur 30 (A) NEGATIVE mg/dL   Nitrite NEGATIVE NEGATIVE   Leukocytes,Ua NEGATIVE NEGATIVE   RBC / HPF 0-5 0 - 5 RBC/hpf   WBC, UA 6-10 0 - 5 WBC/hpf    Comment:        Reflex urine culture not performed if WBC <=10, OR if Squamous epithelial cells >5. If Squamous epithelial cells >5 suggest recollection.    Bacteria, UA RARE (A) NONE SEEN   Squamous Epithelial / HPF 0-5 0 - 5 /HPF   Mucus PRESENT     Comment: Performed at [facility], [place] [address], [place], [place] [address]  Glucose, capillary     Status: Abnormal   Collection Time: 07/11/22  3:32 PM  Result Value Ref Range   Glucose-Capillary 137 (H) 70 - 99 mg/dL    Comment: Glucose reference range applies only to samples taken after fasting for at least 8 hours.   Comment 1 Notify RN   Comprehensive metabolic panel     Status: Abnormal   Collection Time: 07/12/22  4:02 AM  Result Value Ref Range   Sodium 142 135 - 145 mmol/L   Potassium 4.1 3.5 - 5.1 mmol/L   Chloride 114 (H) 98 - 111 mmol/L   CO2 20 (L) 22 - 32 mmol/L   Glucose, Bld 110 (H) 70 - 99 mg/dL    Comment: Glucose reference range applies only to samples taken after fasting for at least 8 hours.   BUN 37 (H) 8 - 23 mg/dL   Creatinine, Ser 1.59 (H) 0.61 - 1.24 mg/dL   Calcium 8.3 (L) 8.9 - 10.3 mg/dL   Total Protein 6.9 6.5 - 8.1 g/dL   Albumin 2.7 (L) 3.5 - 5.0 g/dL   AST 46 (H) 15 - 41 U/L   ALT 72 (H) 0 - 44 U/L   Alkaline Phosphatase 55 38 - 126 U/L   Total Bilirubin 0.5 0.3 - 1.2 mg/dL   GFR, Estimated 48 (L) >60 mL/min  Comment: (NOTE) Calculated using the CKD-EPI Creatinine Equation (2021)    Anion gap 8 5 - 15    Comment: Performed at [facility], [place] [address], [place], [place] [address]  Magnesium     Status: None   Collection Time: 07/12/22  4:02 AM  Result Value Ref Range   Magnesium 2.4 1.7 - 2.4 mg/dL    Comment: Performed at [facility], [place] [address], [place], [place] [address]  Phosphorus     Status: None   Collection Time: 07/12/22  4:02 AM  Result Value Ref Range   Phosphorus 3.2 2.5 - 4.6 mg/dL    Comment: Performed at [facility], [place] [address] [place], [place] [address]  Triglycerides     Status: None   Collection Time: 07/12/22  4:02 AM  Result Value Ref Range   Triglycerides 127 <150 mg/dL    Comment: Performed at [facility], [place] [address], [place], [place] [address]  CBC with Differential/Platelet     Status: Abnormal   Collection Time: 07/12/22  4:02 AM  Result Value Ref Range   WBC 21.6 (H) 4.0 - 10.5 K/uL   RBC 3.41 (L) 4.22 - 5.81 MIL/uL   Hemoglobin 8.9 (L) 13.0 - 17.0 g/dL    Comment: Reticulocyte Hemoglobin testing may be clinically indicated, consider ordering this additional test [id number]    HCT 26.3 (L) 39.0 - 52.0 %   MCV 77.1 (L) 80.0 - 100.0 fL   MCH 26.1 26.0 - 34.0 pg   MCHC 33.8 30.0 - 36.0 g/dL   RDW 15.3 11.5 - 15.5 %   Platelets 466 (H) 150 - 400 K/uL   nRBC 0.0 0.0 - 0.2 %   Neutrophils Relative % 73 %   Neutro Abs 16.0 (H) 1.7 - 7.7 K/uL   Lymphocytes Relative 10 %   Lymphs Abs 2.2 0.7 - 4.0 K/uL   Monocytes Relative 10 %   Monocytes Absolute 2.1 (H) 0.1 - 1.0 K/uL   Eosinophils Relative 4 %   Eosinophils Absolute 0.9 (H) 0.0 - 0.5 K/uL   Basophils Relative 1 %   Basophils Absolute 0.2 (H) 0.0 - 0.1 K/uL   Immature Granulocytes 2 %   Abs Immature Granulocytes 0.37 (H) 0.00 - 0.07 K/uL    Comment: Performed at [facility], [place] [address], [place], [place] [address]  Procalcitonin     Status: None   Collection Time: 07/12/22  4:02 AM  Result Value Ref Range   Procalcitonin 0.24 ng/mL    Comment:        Interpretation: PCT (Procalcitonin) <= 0.5 ng/mL: Systemic infection (sepsis) is not likely. Local bacterial infection is possible. (NOTE)       Sepsis PCT Algorithm           Lower Respiratory Tract                                      Infection PCT Algorithm    ----------------------------     ----------------------------         PCT < 0.25 ng/mL                PCT < 0.10 ng/mL          Strongly encourage             Strongly discourage   discontinuation of antibiotics  initiation of antibiotics    ----------------------------     -----------------------------       PCT 0.25 - 0.50 ng/mL            PCT 0.10 - 0.25 ng/mL               OR       >80% decrease in PCT            Discourage initiation of                                            antibiotics      Encourage discontinuation           of antibiotics    ----------------------------     -----------------------------         PCT >= 0.50 ng/mL              PCT 0.26 - 0.50 ng/mL               AND        <80% decrease in PCT             Encourage initiation of                                             antibiotics       Encourage continuation           of antibiotics    ----------------------------     -----------------------------        PCT >= 0.50 ng/mL                  PCT > 0.50 ng/mL               AND         increase in PCT                  Strongly encourage                                      initiation of antibiotics    Strongly encourage escalation           of antibiotics                                     -----------------------------                                           PCT <= 0.25 ng/mL                                                 OR                                        >  80% decrease in PCT                                       Discontinue / Do not initiate                                             antibiotics  Performed at [place] [address], [place], [place] [address]   Glucose, capillary     Status: Abnormal   Collection Time: 07/12/22  5:55 AM  Result Value Ref Range   Glucose-Capillary 143 (H) 70 - 99 mg/dL    Comment: Glucose reference range applies only to samples taken after fasting for at least 8 hours.    Imaging / Studies: No results found.  Medications / Allergies: per chart  Antibiotics: Anti-infectives (From admission, onward)    Start     Dose/Rate Route Frequency Ordered Stop   07/10/22 0400  piperacillin-tazobactam (ZOSYN) IVPB 3.375 g  Status:  Discontinued        3.375 g 12.5 mL/hr over 240 Minutes Intravenous Every 8 hours 07/09/22 2040 07/10/22 0738   07/08/22 1100  piperacillin-tazobactam (ZOSYN) IVPB 3.375 g  Status:  Discontinued        3.375 g 12.5 mL/hr over 240 Minutes Intravenous Every 8 hours 07/08/22 1001 07/09/22 2040   07/08/22 1000  erythromycin 250 mg in sodium chloride 0.9 % 100 mL IVPB  Status:  Discontinued        250 mg 100 mL/hr over 60 Minutes Intravenous Every 12 hours 07/08/22 0739 07/09/22 0730   07/06/22 0915  erythromycin 250 mg in sodium chloride 0.9 % 100 mL IVPB  Status:  Discontinued        250 mg 100 mL/hr over 60 Minutes Intravenous Every 8 hours 07/06/22 0823 07/08/22 0739   07/03/22 1700  piperacillin-tazobactam (ZOSYN) IVPB 3.375 g        3.375 g 12.5 mL/hr over 240 Minutes Intravenous Every 8 hours 07/03/22 1612 07/08/22 0450   07/02/22 0000  cefoTEtan (CEFOTAN) 2 g in sodium chloride 0.9 % 100 mL IVPB        2 g 200 mL/hr over 30 Minutes Intravenous Every 12 hours 07/01/22 1737 07/02/22 0051   07/01/22 1400  neomycin (MYCIFRADIN) tablet 1,000 mg  Status:  Discontinued       See Hyperspace for full Linked Orders Report.   1,000 mg Oral 3 times per day 07/01/22 1054 07/01/22 1109   07/01/22 1400  metroNIDAZOLE  (FLAGYL) tablet 1,000 mg  Status:  Discontinued       See Hyperspace for full Linked Orders Report.   1,000 mg Oral 3 times per day 07/01/22 1054 07/01/22 1109   07/01/22 1100  cefoTEtan (CEFOTAN) 2 g in sodium chloride 0.9 % 100 mL IVPB        2 g 200 mL/hr over 30 Minutes Intravenous On call to O.R. 07/01/22 1054 07/01/22 1204         Note: Portions of this report may have been transcribed using voice recognition software. Every effort was made to ensure accuracy; however, inadvertent computerized transcription errors may be present.   Any transcriptional errors that result from this process are unintentional.    Adin Hector, MD, FACS, MASCRS Esophageal, Gastrointestinal & Colorectal  Surgery Robotic and Minimally Invasive Surgery  [facility] [id number], [place], [place] [address] [phone] Fax [phone] Main  CONTACT INFORMATION:  Weekday (9AM-5PM): Call [facility] main office at [phone]  Weeknight (5PM-9AM) or Weekend/Holiday: Check [email or web address] (password " [email or web address]") for General Surgery [facility] coverage  (Please, do not use SecureChat as it is not reliable communication to reach operating surgeons for immediate patient care given surgeries/outpatient duties/clinic/cross-coverage/off post-call which would lead to a delay in care.  Epic staff messaging available for outptient concerns, but may not be answered for 48 hours or more).     07/12/2022  6:15 AM

## 2022-07-12 NOTE — Progress Notes (Signed)
OT Cancellation Note  Patient Details Name: DEMONE [name] MRN: [id number] DOB: [birth year]   Cancelled Treatment:    Reason Eval/Treat Not Completed: Patient declined, no reason specified Patient declined to participate in session. Patient was educated on difference from OT and PT. Patient verbalized understanding but continued to reporting fatigue. OT to continue to follow  [name], [place] [facility] Department Office# [phone]  07/12/2022, 3:08 PM

## 2022-07-12 NOTE — Progress Notes (Addendum)
Physical Therapy Treatment Patient Details Name: Thomas [name] MRN: [id number] DOB: [birth year] Today's Date: 07/12/2022   History of Present Illness Patient is a 67 yo male admitted 07/01/22  S/P LYSIS OF ADHESIONS , PRIMARY REPAIR OF INCARCERATED INCISIONAL HERNIA , TAKEDOWN OF END COLOSTOMY WITH ANASTOMOSIS.2/24 moved to stepdown unit for hypoxemia, right lower lobe collapse CT chest showed debris in right lower lobe with atelectasis. Now with post op ileus, asp pneumonitis. PMH, colon and rectal  cancer, colostomy, VDRF with trach 2022,   DVT, afib, DM, HTN, seizure, CVA, fromtal meningioma, lobectomy    PT Comments    Pt agreeable to working with therapy with some encouragement. He is anxious and hesitant to mobilize-provided reassurance and reinforced importance of mobility. Pain appeared controlled during session. Will continue to progress activity as able. TOC will likely need to confirm level of assistance/supervision available at home to help with d/c planning. Will benefit from mobility team assistance once he is transferred out of ICU.    Recommendations for follow up therapy are one component of a multi-disciplinary discharge planning process, led by the attending physician.  Recommendations may be updated based on patient status, additional functional criteria and insurance authorization.  Follow Up Recommendations  Home health PT vs SNF-depending on continued progress and assist available at home)     Assistance Recommended at Discharge Intermittent Supervision/Assistance  Patient can return home with the following Assist for transportation;Assistance with cooking/housework;Help with stairs or ramp for entrance;A little help with walking and/or transfers   Equipment Recommendations  None recommended by PT    Recommendations for Other Services       Precautions / Restrictions Precautions Precautions: Fall Restrictions Weight Bearing Restrictions: No     Mobility  Bed  Mobility Overal bed mobility: Needs Assistance Bed Mobility: Supine to Sit     Supine to sit: HOB elevated, Min guard     General bed mobility comments: Min guard for safety, line management. Increased time. Cues provided.    Transfers Overall transfer level: Needs assistance Equipment used: Rolling walker (2 wheels) Transfers: Sit to/from Stand Sit to Stand: Min guard, From elevated surface           General transfer comment: Min guard for safety. Cues provided.    Ambulation/Gait Ambulation/Gait assistance: Min guard Gait Distance (Feet): 25 Feet Assistive device: Rolling walker (2 wheels) Gait Pattern/deviations: Step-through pattern, Decreased stride length       General Gait Details: Min guard for safety, management of lines/equipmenet. Pt tolerated distance well. No LOB with RW use. Steady with RW   Stairs             Wheelchair Mobility    Modified Rankin (Stroke Patients Only)       Balance Overall balance assessment: Needs assistance         Standing balance support: Bilateral upper extremity supported, Reliant on assistive device for balance, During functional activity Standing balance-Leahy Scale: Fair                              Cognition Arousal/Alertness: Awake/alert Behavior During Therapy: Anxious Overall Cognitive Status: Within Functional Limits for tasks assessed                                          Exercises General Exercises - Lower Extremity Ankle Circles/Pumps:  AROM, Both, 10 reps, Supine Quad Sets: AROM, Both, 10 reps, Supine    General Comments        Pertinent Vitals/Pain Pain Assessment Pain Assessment: Faces Faces Pain Scale: Hurts little more Pain Location: abbdomen Pain Descriptors / Indicators: Discomfort, Grimacing, Sore Pain Intervention(s): Limited activity within patient's tolerance, Monitored during session, Repositioned    Home Living                           Prior Function            PT Goals (current goals can now be found in the care plan section) Progress towards PT goals: Progressing toward goals    Frequency    Min 3X/week      PT Plan Current plan remains appropriate    Co-evaluation              AM-PAC PT "6 Clicks" Mobility   Outcome Measure  Help needed turning from your back to your side while in a flat bed without using bedrails?: A Little Help needed moving from lying on your back to sitting on the side of a flat bed without using bedrails?: A Little Help needed moving to and from a bed to a chair (including a wheelchair)?: A Little Help needed standing up from a chair using your arms (e.g., wheelchair or bedside chair)?: A Little Help needed to walk in hospital room?: A Lot Help needed climbing 3-5 steps with a railing? : A Lot 6 Click Score: 16    End of Session Equipment Utilized During Treatment: Gait belt Activity Tolerance: Patient tolerated treatment well Patient left: in chair;with call bell/phone within reach;with chair alarm set   PT Visit Diagnosis: Difficulty in walking, not elsewhere classified (R26.2);Pain     Time: 1120-1145 PT Time Calculation (min) (ACUTE ONLY): 25 min  Charges:  $Gait Training: 23-37 mins                        Doreatha Massed, PT Acute Rehabilitation  Office: [phone]

## 2022-07-12 NOTE — Evaluation (Signed)
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   Dizziness    Duodenal ulcer disease    Epilepsy ([place])    takes Keppra daily   GERD (gastroesophageal reflux disease)    takes Omeprazole daily   Headache    Heart murmur    Hyperlipidemia    takes Atorvastatin daily   Hypertension    takes Lotrel daily   Meningioma ([place]) 09/01/2016   Peripheral edema    takes Lasix daily   Peripheral neuropathy    takes Gabapentin daily   Seizures ([place])    Sleep apnea    no cpap   Urinary frequency    Viral gastroenteritis 09/29/2021   Past Surgical History:  Past Surgical History:  Procedure Laterality Date   BIOPSY  09/05/2020   Procedure: BIOPSY;  Surgeon: Irene Shipper, MD;  Location: [facility] ENDOSCOPY;  Service: Endoscopy;;   BIOPSY  02/06/2021   Procedure: BIOPSY;  Surgeon: Jerene Bears, MD;  Location: [facility] ENDOSCOPY;  Service: Gastroenterology;;   CARDIAC CATHETERIZATION  7 yrs ago   COLONOSCOPY WITH PROPOFOL N/A 09/05/2020   Procedure: COLONOSCOPY WITH PROPOFOL;  Surgeon: Irene Shipper, MD;  Location: [facility] ENDOSCOPY;  Service: Endoscopy;  Laterality: N/A;   CRANIOTOMY N/A 09/01/2016   Procedure: CRANIOTOMY TUMOR  LEFT PTERIONAL;  Surgeon: Ashok Pall, MD;  Location: [place];  Service: Neurosurgery;  Laterality: N/A;   cyst removed from chest       as a child   ESOPHAGOGASTRODUODENOSCOPY (EGD) WITH PROPOFOL N/A 02/06/2021   Procedure: ESOPHAGOGASTRODUODENOSCOPY (EGD) WITH PROPOFOL;  Surgeon: Jerene Bears, MD;  Location: [facility] ENDOSCOPY;  Service: Gastroenterology;  Laterality: N/A;   INGUINAL HERNIA REPAIR N/A 07/01/2022   Procedure: HERNIA REPAIR INGUINAL INCARCERATED;  Surgeon: Michael Boston, MD;  Location: [facility] ORS;  Service: General;  Laterality: N/A;   IR FLUORO GUIDE CV LINE LEFT  12/22/2020   IR GASTROSTOMY TUBE MOD SED  02/10/2021   IR GASTROSTOMY TUBE REMOVAL  04/15/2021   IR REMOVAL TUN CV CATH W/O FL  03/09/2021   IR US GUIDE VASC ACCESS LEFT  12/22/2020   LYSIS OF ADHESION N/A 07/01/2022   Procedure: LYSIS OF ADHESION;  Surgeon: Michael Boston, MD;  Location: [facility] ORS;  Service: General;  Laterality: N/A;   NO PAST SURGERIES     PARTIAL COLECTOMY N/A 09/10/2020   Procedure: TRANSVERSE COLECTOMY;  Surgeon: [name], MD;  Location: [place];  Service: General;  Laterality: N/A;   POLYPECTOMY  09/05/2020   Procedure: POLYPECTOMY;  Surgeon: Irene Shipper, MD;  Location: [place];  Service: Endoscopy;;   PORTACATH PLACEMENT Right 10/01/2020   Procedure: INSERTION PORT-A-CATH;  Surgeon: [name], MD;  Location: [place];  Service: General;  Laterality: Right;   SUBMUCOSAL TATTOO INJECTION  09/05/2020   Procedure: SUBMUCOSAL TATTOO INJECTION;  Surgeon: Irene Shipper, MD;  Location: [facility] END[facility]COPY;  Service: Endoscopy;;   TRACHEOSTOMY TUBE PLACEMENT N/A 01/05/2021   Procedure: TRACHEOSTOMY;  Surgeon: [name], MD;  Location: [place];  Service: ENT;  Laterality: N/A;   XI ROBOTIC ASSISTED COLOSTOMY TAKEDOWN N/A 07/01/2022   Procedure: ROBOTIC OSTOMY TAKEDOWN WITH INTRACORPOREAL ANASTOMOSIS WITH FIREFLY;  Surgeon: Michael Boston, MD;  Location: [facility] ORS;  Service: General;  Laterality: N/A;   HPI:  Patient is a 67 y.o. male with PMH: CVA, frontal meningioma s/p lobectomy, DVT, a-fib, HTN, DM, colon and rectal cancer, colostomy, VDRF with  trach 2022, transverse colon cancer, stage IIIb (T3N1a), status post a partial transverse colectomy and end colostomy 09/10/2020, CT abdomen/pelvis 09/04/2020- possible transverse colon mass with small regional lymph nodes, CT chest 09/05/2020- obstructing transverse colon mass-biopsy invasive adenocarcinoma, mass could not be passed CT chest 09/09/2020-no evidence of metastatic disease, 3 x 2 mm subpleural nodule in the right upper lobe likely benign subpleural lymph node. He presented to the hospital on 07/01/22.  Underwent exploratory laparotomy, transverse colectomy and end colostomy 09/10/2020. He presented to the hospital on 07/01/22, S/P LYSIS OF ADHESIONS ,  PRIMARY REPAIR OF INCARCERATED INCISIONAL HERNIA , TAKEDOWN OF END COLOSTOMY WITH ANASTOMOSIS.2/24 moved to stepdown unit for hypoxemia, right lower lobe collapse CT chest showed debris in right lower lobe with atelectasis. SLP swallow evaluation ordered on 07/04/22 but  unable to be completed due to GI ussues. MD initiated PO diet of Dys 1 (puree), thin liquids on 07/11/22 reordered SLP on 07/12/22 secondary topostop ileus with vomiting and probably aspiration.    Assessment / Plan / Recommendation  Clinical Impression  Patient did not present with overt s/s aspiration or penetration during this bedside swallow evaluation, however this was limited to thin liquids as patient currently pleasantly declining any solids. He did exhibit some delayed instances of hiccuping but no other observed s/s and patient not complaining of significant difficulties with his swallowing and swallow initiation appeared timely. Patient told SLP that he does not feel comfortable trying any solids at this time but that when he is out of the ICU he feels he would be able to try. He does acknowledge that  his reservations in trying solids is partly "in my head". SLP in agreement with MD's recommendation for Dys 1 (Puree) solids, thin liquids but if patient agreeable, he should be able to  tolerate Dys 2 (finely chopped/minced) solids. SLP will follow patient briefly to ensure that he is tolerating solid PO's when he is willing to try them. SLP Visit Diagnosis: Dysphagia, unspecified (R13.10)    Aspiration Risk  No limitations;Mild aspiration risk    Diet Recommendation Dysphagia 1 (Puree);Dysphagia 2 (Fine chop);Thin liquid   Liquid Administration via: Cup;Straw Medication Administration: Whole meds with liquid Supervision: Patient able to self feed Compensations: Slow rate;Small sips/bites Postural Changes: Seated upright at 90 degrees;Remain upright for at least 30 minutes after po intake    Other  Recommendations Oral Care Recommendations: Oral care BID    Recommendations for follow up therapy are one component of a multi-disciplinary discharge planning process, led by the attending physician.  Recommendations may be updated based on patient status, additional functional criteria and insurance authorization.  Follow up Recommendations Other (comment) (TBD)      Assistance Recommended at Discharge    Functional Status Assessment Patient has had a recent decline in their functional status and demonstrates the ability to make significant improvements  in function in a reasonable and predictable amount of time.  Frequency and Duration min 2x/week  1 week;2 weeks       Prognosis Prognosis for improved oropharyngeal function: Good      Swallow Study   General Date of Onset: 07/04/22 HPI: Patient is a 67 y.o. male with PMH: CVA, frontal meningioma s/p lobectomy, DVT, a-fib, HTN, DM, colon and rectal cancer, colostomy, VDRF with trach 2022, transverse colon cancer, stage IIIb (T3N1a), status post a partial transverse colectomy and end colostomy 09/10/2020, CT abdomen/pelvis 09/04/2020- possible transverse colon mass with small regional lymph nodes, CT chest 09/05/2020- obstructing transverse colon mass-biopsy invasive adenocarcinoma, mass could not be passed CT chest  09/09/2020-no evidence of metastatic disease, 3 x 2 mm subpleural nodule in the right upper lobe likely benign subpleural lymph node. He presented to the hospital on 07/01/22.  Underwent exploratory laparotomy, transverse colectomy and end colostomy 09/10/2020. He presented to the hospital on 07/01/22, S/P LYSIS OF ADHESIONS ,  PRIMARY REPAIR OF INCARCERATED INCISIONAL HERNIA , TAKEDOWN OF END COLOSTOMY WITH ANASTOMOSIS.2/24 moved to stepdown unit for hypoxemia, right lower lobe collapse CT chest showed debris in right lower lobe with atelectasis. SLP swallow evaluation ordered on 07/04/22 but  unable to be completed due to GI ussues. MD initiated PO diet of Dys 1 (puree), thin liquids on 07/11/22 reordered SLP on 07/12/22 secondary topostop ileus with vomiting and probably aspiration. Type of Study: Bedside Swallow Evaluation Previous Swallow Assessment: remote (2022) during previous hospitalization Diet Prior to this Study: Dysphagia 1 (pureed);Thin liquids (Level 0) Temperature Spikes Noted: No Respiratory Status: Room air History of Recent Intubation: Yes Total duration of intubation (days):  (for surgery only) Date extubated: 07/03/22 Behavior/Cognition: Alert;Cooperative;Pleasant mood Oral Cavity Assessment: Within Functional Limits Oral Care Completed by SLP: No Oral Cavity - Dentition: Adequate natural dentition;Poor condition Vision: Functional for self-feeding Self-Feeding Abilities: Able to feed self Patient Positioning: Upright in bed Baseline Vocal Quality: Normal Volitional Cough: Strong Volitional Swallow: Able to elicit    Oral/Motor/Sensory Function Overall Oral Motor/Sensory Function: Within functional limits   Ice Chips     Thin Liquid Thin Liquid: Within functional limits Presentation: Self Fed;Straw    Nectar Thick     Honey Thick     Puree Puree: Not tested   Solid     Solid: Not tested      [name] Baller, MA, CCC-SLP Speech Therapy

## 2022-07-12 NOTE — Progress Notes (Signed)
PROGRESS NOTE  Thomas [name] [id number] DOB: Jan 10, 1956 DOA: 07/01/2022 PCP: [name], NP  HPI/Recap of past 24 hours: Thomas [name] is a 67 y.o. male with past medical history of CKD stage IIIb, diabetes mellitus, hypertension, hyperlipidemia, colon cancer diagnosed in 2022, dysphagia, brain tumor during 2018, acute ischemic left MCA stroke in 2015, history of seizure disorder presents with a complex surgical history was admitted for an elective robotic ostomy takedown with lysis of adhesions on February 22, postoperatively noted to have right lower lobe collapse and hypoxemia. Moved to stepdown on February 24 for management and has remained in stepdown due to associated complications.  Has been followed by PCCM while in stepdown.  PCCM is stabilized his respiratory state and now currently on room air.  [facility] hospitalist consulted to assist in managing his other medical comorbidities.     Today, patient continues to improve overall.  Still with persistent liquid diarrhea, denies any worsening abdominal pain, nausea/vomiting.  Still reporting very poor appetite, reporting abdominal fullness.  No fever/chills noted.  Denies any cough, runny nose or sore throat.    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)   History of ischemic left MCA stroke 2015   Colon cancer ([place])   Protein-calorie malnutrition, moderate (HCC)   Epilepsy ([place])   Hyperlipidemia   Gastroesophageal reflux disease without esophagitis   Delirium   Hypokalemia   History of meningioma of the brain   Incisional hernia, without obstruction or gangrene   CKD (chronic kidney disease) stage 3, GFR 30-59 ml/min  (HCC)   Insomnia due to medical condition   Hypophosphatemia   Hiccoughs   S/p lysis of adhesions, colostomy takedown Ileus Further management as per general surgery who is the primary attending   Acute hypoxic respiratory failure likely 2/2 mucous plugging right lower lobe with collapse- Resolved Noted dysphagia with aspiration events on 2/23 Currently now on room air, saturating well Continue clear liquid diet, TPN, no oral meds for now as per general surgery Pulmonary hygiene, incentive spirometer/flutter   Leukocytosis Unsure etiology Continues to trend up Currently afebrile Repeat CT abdomen/pelvis with no abscess or delayed leak UA unremarkable BC X 2 NGTD Procalcitonin 0.24 C. difficile negative, GIP panel pending Daily cbc   CKD stage IIIb Creatinine stable Bladder scan to ruled out urinary retention Daily BMP   History of hypertension BP stable Restart PO amlodipine, continue IV hydralazine/labetalol as needed    Diabetes mellitus type 2 Last A1c 5.1 Diet, Accu-Cheks, hypoglycemic protocol   History of CVA History of brain mass Seizure disorder Continue Keppra   GERD Continue PPI   Noted possible hospital-acquired delirium Anxiety Seroquel prn at bedtime should be considered first before using haldol Continue as needed Ativan Delirium precautions    Malnutrition Type:  Nutrition Problem: Moderate Malnutrition Etiology: chronic illness (transverse colon mass)   Malnutrition Characteristics:  Signs/Symptoms: moderate fat depletion, moderate muscle depletion   Nutrition Interventions:  Interventions: Refer to RD note for recommendations, TPN    Estimated body mass index is 28.63 kg/m as calculated from the following:   Height as of this encounter: '5\' 8"'$  (1.727 m).   Weight as of this encounter: 85.4 kg.     Code Status: Full  Family Communication: None at bedside  Disposition Plan: Status is: Inpatient Remains inpatient  appropriate because: Level of care      Consultants: TR[facility]  Procedures: S/p lysis of adhesions, colostomy takedown  Antimicrobials: None  DVT prophylaxis:  lovenox  Objective: Vitals:   07/12/22 1200 07/12/22 1205 07/12/22 1300 07/12/22 1328  BP: (!) 131/48  (!) 138/56   Pulse: 94  86 83  Resp: (!) 22  (!) 26 16  Temp:  (!) 100.4 F (38 C)  98.6 F (37 C)  TempSrc:  Oral  Oral  SpO2: 97%  97% 100%  Weight:      Height:        Intake/Output Summary (Last 24 hours) at 07/12/2022 1359 Last data filed at 07/12/2022 1045 Gross per 24 hour  Intake 2536.36 ml  Output 1635 ml  Net 901.36 ml   Filed Weights   07/10/22 0500 07/11/22 0442 07/12/22 0702  Weight: 87.9 kg 84.9 kg 85.4 kg    Exam: General: NAD, chronically ill appearing  Cardiovascular: S1, S2 present Respiratory: CTAB Abdomen: Soft, +tender, +distended, bowel sounds present, dressing c/d/i Musculoskeletal: No bilateral pedal edema noted Skin: Normal Psychiatry: Fair mood     Data Reviewed: CBC: Recent Labs  Lab 07/08/22 0259 07/09/22 0339 07/10/22 0657 07/11/22 0435 07/12/22 0402  WBC 16.0* 16.4* 15.9* 16.7* 21.6*  NEUTROABS  --   --   --   --  16.0*  HGB 8.7* 9.6* 10.2* 9.1* 8.9*  HCT 25.4* 29.2* 31.1* 27.1* 26.3*  MCV 76.7* 78.9* 78.3* 77.4* 77.1*  PLT 301 328 445* 454* 123XX123*   Basic Metabolic Panel: Recent Labs  Lab 07/08/22 0259 07/08/22 2029 07/09/22 0339 07/10/22 0657 07/11/22 0435 07/12/22 0402  NA 139  --  142 140 143 142  K 3.4*  --  3.5 3.7 3.7 4.1  CL 108  --  110 110 114* 114*  CO2 26  --  '25 23 22 '$ 20*  GLUCOSE 110*  --  127* 148* 143* 110*  BUN 16  --  18 23 29* 37*  CREATININE 1.53*  --  1.58* 1.68* 1.68* 1.59*  CALCIUM 7.8*  --  7.9* 8.0* 8.2* 8.3*  MG 2.0  --  1.8 2.3 2.4 2.4  PHOS 1.2* 2.7 3.0 3.2 3.1 3.2   GFR: Estimated Creatinine Clearance: 49.3 mL/min (A) (by C-G formula based on SCr of 1.59 mg/dL (H)). Liver Function Tests: Recent Labs  Lab  07/07/22 1006 07/09/22 0339 07/12/22 0402  AST 22 22 46*  ALT 19 22 72*  ALKPHOS 54 52 55  BILITOT 0.9 0.8 0.5  PROT 5.8* 6.6 6.9  ALBUMIN 2.5* 2.6* 2.7*   No results for input(s): "LIPASE", "AMYLASE" in the last 168 hours. Recent Labs  Lab 07/07/22 1006  AMMONIA 17   Coagulation Profile: No results for input(s): "INR", "PROTIME" in the last 168 hours. Cardiac Enzymes: No results for input(s): "CKTOTAL", "CKMB", "CKMBINDEX", "TROPONINI" in the last 168 hours. BNP (last 3 results) No results for input(s): "PROBNP" in the last 8760 hours. HbA1C: No results for input(s): "HGBA1C" in the last 72 hours. CBG: Recent Labs  Lab 07/11/22 1217 07/11/22 1532 07/12/22 0012 07/12/22 0555 07/12/22 1209  GLUCAP 132* 137* 175* 143* 151*   Lipid Profile: Recent Labs    07/12/22 0402  TRIG 127   Thyroid Function Tests: No results for input(s): "TSH", "T4TOTAL", "FREET4", "T3FREE", "THYROIDAB" in the last 72 hours. Anemia Panel: No results for input(s): "VITAMINB12", "FOLATE", "FERRITIN", "TIBC", "IRON", "RETICCTPCT"  in the last 72 hours. Urine analysis:    Component Value Date/Time   COLORURINE YELLOW 07/11/2022 1448   APPEARANCEUR HAZY (A) 07/11/2022 1448   LABSPEC 1.030 07/11/2022 1448   PHURINE 5.0 07/11/2022 1448   GLUCOSEU NEGATIVE 07/11/2022 1448   HGBUR SMALL (A) 07/11/2022 1448   BILIRUBINUR NEGATIVE 07/11/2022 1448   KETONESUR NEGATIVE 07/11/2022 1448   PROTEINUR 30 (A) 07/11/2022 1448   UROBILINOGEN 0.2 09/27/2014 0301   NITRITE NEGATIVE 07/11/2022 1448   LEUKOCYTESUR NEGATIVE 07/11/2022 1448   Sepsis Labs: '@LABRCNTIP'$ (procalcitonin:4,lacticidven:4)  ) Recent Results (from the past 240 hour(s))  MRSA Next Gen by PCR, Nasal     Status: None   Collection Time: 07/03/22  3:16 PM   Specimen: Nasal Mucosa; Nasal Swab  Result Value Ref Range Status   MRSA by PCR Next Gen NOT DETECTED NOT DETECTED Final    Comment: (NOTE) The GeneXpert MRSA Assay (FDA approved  for NASAL specimens only), is one component of a comprehensive MRSA colonization surveillance program. It is not intended to diagnose MRSA infection nor to guide or monitor treatment for MRSA infections. Test performance is not FDA approved in patients less than 7 years old. Performed at [facility], [place] [address] [place], [place] [address]   Culture, blood (Routine X 2) w Reflex to ID Panel     Status: None (Preliminary result)   Collection Time: 07/11/22  9:57 AM   Specimen: BLOOD  Result Value Ref Range Status   Specimen Description   Final    BLOOD BLOOD LEFT ARM Performed at [facility] [address] [place], [place] [address]    Special Requests   Final    BOTTLES DRAWN AEROBIC AND ANAEROBIC Blood Culture adequate volume Performed at [facility] [address], [place], [place] [address]    Culture   Final    NO GROWTH < 24 HOURS Performed at [facility] [address], [place], [place] [address]    Report Status PENDING  Incomplete  Culture, blood (Routine X 2) w Reflex to ID Panel     Status: None (Preliminary result)   Collection Time: 07/11/22  9:57 AM   Specimen: BLOOD  Result Value Ref Range Status   Specimen Description   Final    BLOOD BLOOD LEFT HAND Performed at [facility] [address] [place], [place] [address]    Special Requests   Final    BOTTLES DRAWN AEROBIC AND ANAEROBIC Blood Culture adequate volume Performed at [facility] [address], [place], [place] [address]    Culture   Final    NO GROWTH < 24 HOURS Performed at [facility] [address], [place], [name] [address]    Report Status PENDING  Incomplete  C Difficile Quick Screen (NO PCR Reflex)     Status: None   Collection Time: 07/12/22  8:24 AM   Specimen: STOOL  Result Value Ref Range Status   C Diff antigen NEGATIVE NEGATIVE Final   C Diff toxin NEGATIVE  NEGATIVE Final   C Diff interpretation No C. difficile detected.  Final    Comment: Performed at [facility], [place] [address] [place], [place] [address]      Studies: No results found.  Scheduled Meds:  allopurinol  100 mg Oral QHS   amLODipine  10 mg Oral q AM   atorvastatin  20 mg Oral Daily   Chlorhexidine  Gluconate Cloth  6 each Topical Daily   enoxaparin (LOVENOX) injection  40 mg Subcutaneous Q24H   gabapentin  100 mg Oral Daily   gabapentin  200 mg Oral QHS   heparin lock flush  500 Units Intracatheter Q30 days   insulin aspart  0-15 Units Subcutaneous Q6H   levETIRAcetam  500 mg Oral BID   lip balm   Topical BID   pantoprazole  40 mg Oral BID AC   polycarbophil  625 mg Oral BID   sodium chloride flush  10-40 mL Intracatheter Q12H    Continuous Infusions:  sodium chloride 10 mL/hr at 07/12/22 0920   sodium chloride Stopped (07/11/22 0753)   lactated ringers Stopped (07/11/22 1424)   TPN ADULT (ION) 100 mL/hr at 07/12/22 0455   TPN ADULT (ION)       LOS: 11 days     Alma Friendly, MD [facility]  If 7PM-7AM, please contact night-coverage [email or web address] 07/12/2022, 1:59 PM

## 2022-07-12 NOTE — Progress Notes (Signed)
PHARMACY - TOTAL PARENTERAL NUTRITION CONSULT NOTE   Indication: Prolonged ileus  Patient Measurements: Height: '5\' 8"'$  (172.7 cm) Weight: 84.9 kg (187 lb 2.7 oz) IBW/kg (Calculated) : 68.4 TPN AdjBW (KG): 73.1 Body mass index is 28.46 kg/m. Usual Weight:   Assessment: 67 yo male with hx colon cancer diagnosed 2022 s/p transverse colectomy admitted for elective robotic ostomy takedown with lysis of adhesions, incarcerated incisional hernia repair on 07/01/22. Post-op course complicated by right lower lobe collapse and hypoxemia, transferred to stepdown on 07/03/22 for management.  Pharmacy consulted for TPN on 07/08/22 for prolonged ileus.  Glucose / Insulin: Hx DM2 A1c 5.1 (07/01/22) - CBG range 132-175, goal 100-180 - 8 units SSI required Electrolytes: WNL, including CorrCa (9.3). Mg (2.4) on upper end of normal -Cl elevated, CO2 low Renal: CKDIII. SCr ~baseline, down. BUN elevated at 37 Hepatic: LFTs elevated (46/72), TG, T.bili WNL. Alb low Intake / Output: -UOP: 790 mL, stool: 745 mL (decreasing).  NG tube out -I/O Net: +1389 ml MIVF: NS @ KVO GI Imaging: - 2/27 AXR: Previous postoperative pneumoperitoneum is less distinct and may have resolved or at least partially resolved. -2/29 Abdominal: Moderate dilation of small bowel loops, suggesting ileus. Linear collection of air adjacent to the right hemidiaphragm. Possibility of pneumoperitoneum is not excluded.  -3/1 CT Abd: No evidence of leak or abscess GI Surgeries / Procedures:  - 2/22: robotic colostomy takedown and lysis of adhesions  Central access: implanted port. Double lumen PICC placed 3/2 TPN start date: 2/29  Nutritional Goals: Goal TPN rate is 100 mL/hr (provides 130 g of protein, 360 g dextrose, and 2486 kcals per day)  RD Assessment: pending Estimated Needs Total Energy Estimated Needs: 2400-2600 kcals Total Protein Estimated Needs: 125-140 grams Total Fluid Estimated Needs: >/= 2.4L  Current Nutrition:  Diet  advanced to DYS1  - f/u plans for calorie count TPN  Plan:  At 1800: Continue TPN at goal rate of 100 mL/hr at 1800 Electrolytes in TPN:  Na 44mq/L, K 40 mEq/L, Ca 057m/L, Mg 60m3mL, and Phos 74m660mL.  Cl:Ac max Ac Add standard MVI and trace elements to TPN Thiamine x 5 days (3/1 > 3/5) Continue mSSI q6h mIVF at [facility] Monitor TPN labs on Mon/Thurs    ChriGretta ArabrmD, BCPS [facility] main pharmacy [phone]/2024 7:11 AM

## 2022-07-13 DIAGNOSIS — Z85048 Personal history of other malignant neoplasm of rectum, rectosigmoid junction, and anus: Secondary | ICD-10-CM | POA: Diagnosis not present

## 2022-07-13 LAB — COMPREHENSIVE METABOLIC PANEL
ALT: 82 U/L — ABNORMAL HIGH (ref 0–44)
AST: 53 U/L — ABNORMAL HIGH (ref 15–41)
Albumin: 2.6 g/dL — ABNORMAL LOW (ref 3.5–5.0)
Alkaline Phosphatase: 67 U/L (ref 38–126)
Anion gap: 5 (ref 5–15)
BUN: 38 mg/dL — ABNORMAL HIGH (ref 8–23)
CO2: 21 mmol/L — ABNORMAL LOW (ref 22–32)
Calcium: 8.3 mg/dL — ABNORMAL LOW (ref 8.9–10.3)
Chloride: 113 mmol/L — ABNORMAL HIGH (ref 98–111)
Creatinine, Ser: 1.4 mg/dL — ABNORMAL HIGH (ref 0.61–1.24)
GFR, Estimated: 56 mL/min — ABNORMAL LOW (ref >60)
Glucose, Bld: 138 mg/dL — ABNORMAL HIGH (ref 70–99)
Potassium: 4.3 mmol/L (ref 3.5–5.1)
Sodium: 139 mmol/L (ref 135–145)
Total Bilirubin: 0.4 mg/dL (ref 0.3–1.2)
Total Protein: 7 g/dL (ref 6.5–8.1)

## 2022-07-13 LAB — CBC WITH DIFFERENTIAL/PLATELET
Abs Immature Granulocytes: 0.31 10*3/uL — ABNORMAL HIGH (ref 0.00–0.07)
Basophils Absolute: 0.1 10*3/uL (ref 0.0–0.1)
Basophils Relative: 1 %
Eosinophils Absolute: 0.6 10*3/uL — ABNORMAL HIGH (ref 0.0–0.5)
Eosinophils Relative: 3 %
HCT: 25.5 % — ABNORMAL LOW (ref 39.0–52.0)
Hemoglobin: 8.7 g/dL — ABNORMAL LOW (ref 13.0–17.0)
Immature Granulocytes: 2 %
Lymphocytes Relative: 8 %
Lymphs Abs: 1.7 10*3/uL (ref 0.7–4.0)
MCH: 26.2 pg (ref 26.0–34.0)
MCHC: 34.1 g/dL (ref 30.0–36.0)
MCV: 76.8 fL — ABNORMAL LOW (ref 80.0–100.0)
Monocytes Absolute: 1.9 10*3/uL — ABNORMAL HIGH (ref 0.1–1.0)
Monocytes Relative: 9 %
Neutro Abs: 16 10*3/uL — ABNORMAL HIGH (ref 1.7–7.7)
Neutrophils Relative %: 77 %
Platelets: 471 10*3/uL — ABNORMAL HIGH (ref 150–400)
RBC: 3.32 MIL/uL — ABNORMAL LOW (ref 4.22–5.81)
RDW: 15.4 % (ref 11.5–15.5)
WBC: 20.6 10*3/uL — ABNORMAL HIGH (ref 4.0–10.5)
nRBC: 0 % (ref 0.0–0.2)

## 2022-07-13 LAB — GLUCOSE, CAPILLARY
Glucose-Capillary: 125 mg/dL — ABNORMAL HIGH (ref 70–99)
Glucose-Capillary: 140 mg/dL — ABNORMAL HIGH (ref 70–99)
Glucose-Capillary: 142 mg/dL — ABNORMAL HIGH (ref 70–99)
Glucose-Capillary: 144 mg/dL — ABNORMAL HIGH (ref 70–99)
Glucose-Capillary: 145 mg/dL — ABNORMAL HIGH (ref 70–99)
Glucose-Capillary: 145 mg/dL — ABNORMAL HIGH (ref 70–99)

## 2022-07-13 MED ORDER — ADULT MULTIVITAMIN W/MINERALS CH
1.0000 | ORAL_TABLET | Freq: Every day | ORAL | Status: DC
  Administered 2022-07-13 – 2022-07-20 (×8): 1 via ORAL
  Filled 2022-07-13 (×8): qty 1

## 2022-07-13 MED ORDER — LACTATED RINGERS IV BOLUS: 1000.0000 mL | Freq: Three times a day (TID) | INTRAVENOUS | Status: AC | PRN

## 2022-07-13 MED ORDER — TRAVASOL 10 % IV SOLN
INTRAVENOUS | Status: AC
  Filled 2022-07-13: qty 1296

## 2022-07-13 MED ORDER — BOOST / RESOURCE BREEZE PO LIQD CUSTOM
1.0000 | Freq: Three times a day (TID) | ORAL | Status: DC
  Administered 2022-07-13 – 2022-07-20 (×18): 1 via ORAL

## 2022-07-13 MED ORDER — THIAMINE MONONITRATE 100 MG PO TABS
100.0000 mg | ORAL_TABLET | Freq: Every day | ORAL | Status: AC
  Administered 2022-07-13: 100 mg via ORAL
  Filled 2022-07-13: qty 1

## 2022-07-13 NOTE — Progress Notes (Addendum)
Occupational Therapy Treatment Patient Details Name: Thomas [name] MRN: [id number] DOB: [birth year] Today's Date: 07/13/2022   History of present illness Patient is a 67 yo male admitted 07/01/22  S/P LYSIS OF ADHESIONS , PRIMARY REPAIR OF INCARCERATED INCISIONAL HERNIA , TAKEDOWN OF END COLOSTOMY WITH ANASTOMOSIS.2/24 moved to stepdown unit for hypoxemia, right lower lobe collapse CT chest showed debris in right lower lobe with atelectasis. Now with post op ileus, asp pneumonitis. PMH, colon and rectal  cancer, colostomy, VDRF with trach 2022,   DVT, afib, DM, HTN, seizure, CVA, fromtal meningioma, lobectomy   OT comments  Patient is making progress towards goals with patient able to transfer with less physical A and participate in bathing tasks seated in recliner. Patient would continue to benefit from skilled OT services at this time while admitted and after d/c to address noted deficits in order to improve overall safety and independence in ADLs. Patient's discharge plan remains appropriate at this time. OT will continue to follow acutely.     Recommendations for follow up therapy are one component of a multi-disciplinary discharge planning process, led by the attending physician.  Recommendations may be updated based on patient status, additional functional criteria and insurance authorization.    Follow Up Recommendations  Skilled nursing-short term rehab (<3 hours/day)     Assistance Recommended at Discharge Frequent or constant Supervision/Assistance  Patient can return home with the following  A lot of help with bathing/dressing/bathroom;Assistance with cooking/housework;Direct supervision/assist for medications management;Assist for transportation;Direct supervision/assist for financial management;Help with stairs or ramp for entrance;A little help with walking and/or transfers   Equipment Recommendations  BSC/3in1       Precautions / Restrictions Precautions Precautions:  Fall Precaution Comments: abdominal Restrictions Weight Bearing Restrictions: No       Mobility Bed Mobility Overal bed mobility: Needs Assistance     Sidelying to sit: Min guard       General bed mobility comments: Min guard for safety, line management. Increased time. Cues provided.         Balance Overall balance assessment: Needs assistance   Sitting balance-Leahy Scale: Fair     Standing balance support: Bilateral upper extremity supported, Reliant on assistive device for balance, During functional activity Standing balance-Leahy Scale: Fair             ADL either performed or assessed with clinical judgement   ADL Overall ADL's : Needs assistance/impaired     Grooming: Wash/dry face;Set up;Sitting   Upper Body Bathing: Set up;Sitting Upper Body Bathing Details (indicate cue type and reason): in recliner Lower Body Bathing: Moderate assistance;Sitting/lateral leans Lower Body Bathing Details (indicate cue type and reason): able to wash some of perineal area and thighs. Upper Body Dressing : Min Probation officer Details (indicate cue type and reason): patient was min guard to transfer from edge of bed to recliner in room with RW taking few steps to reach chair                  Cognition Arousal/Alertness: Awake/alert Behavior During Therapy: [facility] for tasks assessed/performed Overall Cognitive Status: Within Functional Limits for tasks assessed         General Comments: patient motivated to participate in time out of bed this AM        Exercises Other Exercises Other Exercises: patient completed 5 sit to stands with education on proper hand placement with min guard.  Pertinent Vitals/ Pain       Pain Assessment Pain Assessment: Faces Faces Pain Scale: Hurts a little bit Pain Location: abdomen Pain Descriptors / Indicators: Discomfort, Grimacing, Sore Pain Intervention(s): Limited activity within  patient's tolerance, Monitored during session         Frequency  Min 2X/week        Progress Toward Goals  OT Goals(current goals can now be found in the care plan section)  Progress towards OT goals: Progressing toward goals     Plan Discharge plan remains appropriate       AM-PAC OT "6 Clicks" Daily Activity     Outcome Measure   Help from another person eating meals?: None Help from another person taking care of personal grooming?: A Little Help from another person toileting, which includes using toliet, bedpan, or urinal?: A Lot Help from another person bathing (including washing, rinsing, drying)?: A Lot Help from another person to put on and taking off regular upper body clothing?: A Little Help from another person to put on and taking off regular lower body clothing?: A Lot 6 Click Score: 16    End of Session Equipment Utilized During Treatment: Rolling walker (2 wheels)  OT Visit Diagnosis: Unsteadiness on feet (R26.81);Other abnormalities of gait and mobility (R26.89);Muscle weakness (generalized) (M62.81);Pain   Activity Tolerance Patient limited by pain   Patient Left in chair;with call bell/phone within reach;with chair alarm set   Nurse Communication Mobility status        Time: [id number] OT Time Calculation (min): 25 min  Charges: OT General Charges $OT Visit: 1 Visit OT Treatments $Self Care/Home Management : 23-37 mins  [name], MS Acute Rehabilitation Department Office# [phone]   [name] 07/13/2022, 12:28 PM

## 2022-07-13 NOTE — Progress Notes (Signed)
Pt arrived to [facility] . Aox4. No signs of distress. All pts belongings at bedside.

## 2022-07-13 NOTE — Progress Notes (Signed)
Physical Therapy Treatment Patient Details Name: Thomas [name] MRN: [id number] DOB: 1956/01/25 Today's Date: 07/13/2022   History of Present Illness Patient is a 67 yo male admitted 07/01/22  S/P LYSIS OF ADHESIONS , PRIMARY REPAIR OF INCARCERATED INCISIONAL HERNIA , TAKEDOWN OF END COLOSTOMY WITH ANASTOMOSIS.2/24 moved to stepdown unit for hypoxemia, right lower lobe collapse CT chest showed debris in right lower lobe with atelectasis. Now with post op ileus, asp pneumonitis. PMH, colon and rectal  cancer, colostomy, VDRF with trach 2022,   DVT, afib, DM, HTN, seizure, CVA, fromtal meningioma, lobectomy    PT Comments    The patient requesting to return to bed. Encouraged patient to ambulate in room , patient ambulated x 45' with min guard. Continue PT for mobility.   Recommendations for follow up therapy are one component of a multi-disciplinary discharge planning process, led by the attending physician.  Recommendations may be updated based on patient status, additional functional criteria and insurance authorization.  Follow Up Recommendations  Home health PT (vs SNF, depending on progress)     Assistance Recommended at Discharge Intermittent Supervision/Assistance  Patient can return home with the following Assist for transportation;Assistance with cooking/housework;Help with stairs or ramp for entrance;A little help with walking and/or transfers   Equipment Recommendations  None recommended by PT    Recommendations for Other Services       Precautions / Restrictions Precautions Precautions: Fall Precaution Comments: abdominal Restrictions Weight Bearing Restrictions: No     Mobility  Bed Mobility   Bed Mobility: Sit to Supine Rolling: Mod assist         General bed mobility comments: assist with legs    Transfers Overall transfer level: Needs assistance Equipment used: Rolling walker (2 wheels) Transfers: Sit to/from Stand Sit to Stand: Min guard            General transfer comment: Min guard for safety. Cues provided.    Ambulation/Gait Ambulation/Gait assistance: Min guard Gait Distance (Feet): 45 Feet Assistive device: Rolling walker (2 wheels) Gait Pattern/deviations: Step-through pattern, Decreased stride length Gait velocity: decr     General Gait Details: Min guard for safety, management of lines/equipmenet. Pt tolerated distance well. No LOB with RW use. Steady with RW   Stairs             Wheelchair Mobility    Modified Rankin (Stroke Patients Only)       Balance Overall balance assessment: Needs assistance Sitting-balance support: Bilateral upper extremity supported, Feet supported Sitting balance-Leahy Scale: Fair     Standing balance support: Bilateral upper extremity supported, Reliant on assistive device for balance, During functional activity Standing balance-Leahy Scale: Fair                              Cognition Arousal/Alertness: Awake/alert Behavior During Therapy: WFL for tasks assessed/performed Overall Cognitive Status: Within Functional Limits for tasks assessed                                 General Comments: emotional        Exercises      General Comments        Pertinent Vitals/Pain Pain Assessment Pain Assessment: Faces Faces Pain Scale: Hurts even more Pain Location: abdomen Pain Descriptors / Indicators: Discomfort, Grimacing, Sore Pain Intervention(s): Limited activity within patient's tolerance, Monitored during session, Patient requesting pain meds-RN notified  Home Living                          Prior Function            PT Goals (current goals can now be found in the care plan section)      Frequency    Min 3X/week      PT Plan Current plan remains appropriate    Co-evaluation              AM-PAC PT "6 Clicks" Mobility   Outcome Measure  Help needed turning from your back to your side while in a  flat bed without using bedrails?: A Little Help needed moving from lying on your back to sitting on the side of a flat bed without using bedrails?: A Little Help needed moving to and from a bed to a chair (including a wheelchair)?: A Little Help needed standing up from a chair using your arms (e.g., wheelchair or bedside chair)?: A Little Help needed to walk in hospital room?: A Lot Help needed climbing 3-5 steps with a railing? : A Lot 6 Click Score: 16    End of Session Equipment Utilized During Treatment: Gait belt Activity Tolerance: Patient tolerated treatment well Patient left: in bed;with call bell/phone within reach;with family/visitor present;with nursing/sitter in room Nurse Communication: Mobility status PT Visit Diagnosis: Difficulty in walking, not elsewhere classified (R26.2);Pain     Time: 0947-1000 PT Time Calculation (min) (ACUTE ONLY): 13 min  Charges:  $Gait Training: 8-22 mins                     [place] Office [phone] Weekend [id number]    Claretha Cooper 07/13/2022, 1:21 PM

## 2022-07-13 NOTE — Progress Notes (Signed)
[facility] [id number] [birth year]  CARE TEAM:  PCP: [name], NP  Outpatient Care Team: Patient Care Team: [name], NP as PCP - General Benito Mccreedy, MD (Internal Medicine) Patient, No Pcp Per (General Practice) Michael Boston, MD as Consulting Physician (Colon and Rectal Surgery) Irene Shipper, MD as Consulting Physician (Gastroenterology) Ladell Pier, MD as Consulting Physician (Oncology)  Inpatient Treatment Team: Treatment Team: Attending Provider: Michael Boston, MD; Rounding Team: Garner Gavel, MD; Consulting Physician: Alma Friendly, MD; Registered Nurse: Marlene Bast, RN; Technician: Renata Caprice, NT; Occupational Therapist: [name], OT; Registered Nurse: Lawana Chambers, RN; Pharmacist: Randa Spike, [facility]   Problem List:   Principal Problem:   History of colorectal cancer Active Problems:   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   History of ischemic left MCA stroke 2015   Type 2 diabetes mellitus with diabetic nephropathy, without long-term current use of insulin ([place])   Hypertension   Diabetic polyneuropathy associated with type 2 diabetes mellitus ([place])   Colon cancer ([place])   Protein-calorie malnutrition, moderate ([place])   Epilepsy ([place])   Hyperlipidemia   Gastroesophageal reflux disease without esophagitis   Delirium   Hypokalemia   History of meningioma of the brain   Incisional hernia, without obstruction or gangrene   CKD (chronic kidney disease) stage 3, GFR 30-59 ml/min (HCC)   Insomnia due to medical condition   Hypophosphatemia   Hiccoughs   12 Days Post-Op  07/01/2022  POST-OPERATIVE DIAGNOSIS:   COLOSTOMY FOR COLON RESECTION INCARCERATED INCISIONAL HERNIA   PROCEDURE:   TAKEDOWN OF END COLOSTOMY WITH ANASTOMOSIS INTRAOPERATIVE ASSESSMENT OF TISSUE VASCULAR PERFUSION USING ICG (indocyanine green) IMMUNOFLUORESCENCE TRANSVERSUS ABDOMINIS PLANE (TAP) BLOCK -  BILATERAL LYSIS OF ADHESIONS x90 MINUTES (50% OF CASE) PRIMARY REPAIR OF INCARCERATED INCISIONAL HERNIA   SURGEON:  Adin Hector, MD  OR FINDINGS:    Patient had very dense intra-abdominal adhesions of small bowel to all quadrants of the abdomen as well as colon.  He had a loop of small bowel incarcerated in a periumbilical midline incisional hernia 4 x 4 cm.  Reduced and primarily repaired.   No obvious metastatic disease on visceral parietal peritoneum or liver.   It is an isoperistaltic ileocolonic anastomosis (proximal transverse colon to distal transverse colon) that rests in the epigastric region.    Assessment  Deconditioned but stable.  [facility] Stay = 12 days)  Plan:  Transfer to floor written 3/4.  Awaiting bed.  Advance to dysphagia 3 diet for more food options.  His appetite is poor.  Try to encourage him to eat.  Flexi-Seal DC'd.  Diarrhea seems to be tapering off.  Medicine wanted C. difficile checked.  It is negative.  DC contact precautions.  CT scan shows no abscess undrained fluid collection or delayed leak.  Lungs improved.  Stop antibiotics and follow.  Some leukocytosis but no evidence of any active infection and no clinical decline.  ICU delirium with sundowning seems to be stabilizing.  Medicine help with regimen.  TPN for some developing malnutrition.  Hopefully can wean off when he eats adequately.  I think he is someone I would want to do calorie counts on first.  Maybe start tomorrow if he has decent p.o. attempts.  Follow electrolytes closely. He has had issues with hypokalemia & hypophosphatemia.  Stable for now.  Chronic kidney disease but nonoliguric.  Try and keep on the dry side as possible.  Pulmonary status seems  to be stable.   Nebulizers as needed.  Medicine following.  Some acute blood loss anemia in setting of chronic anemia of chronic disease and iron deficiency anemia.  Hgb stable.  Not hypotensive.  Follow.  -VTE prophylaxis-  SCDs, etc  -mobilize as tolerated to help recovery.  Rather deconditioned but was able to mobilize in a wheelchair and up to the chair several times yesterday.  Hopefully then she He will be more mobilizing.  Hopefully with Flexi-Seal can come out that will be even better.   Disposition:  The patient is from: Home  Anticipate discharge to:  [place] (SNF)  Anticipated Date of Discharge is:  March 11,2024    Barriers to discharge:  Pending Clinical improvement (more likely than not)  Patient currently is NOT MEDICALLY STABLE for discharge from the hospital from a surgery standpoint.   I updated the patient's status to the patient and nurse at bedside & ICU RNs just outside room.  Recommendations were made.  Questions were answered.  They expressed understanding & appreciation.    I reviewed nursing notes, last 24 h vitals and pain scores, last 48 h intake and output, last 24 h labs and trends, and last 24 h imaging results.  Reviewed notes with respiratory physical therapies as well as critical care nursing.  I have reviewed this patient's available data, including medical history, events of note, test results, etc as part of my evaluation.  A significant portion of that time was spent in counseling.  Care during the described time interval was provided by me.  This care required moderate level of medical decision making.  07/13/2022    Subjective: (Chief complaint)  Hospitalist called concerned about diarrhea wanting to check C. difficile.  It is negative.  Diarrhea tapered off.  Flexi-Seal out.  Patient denies really much abdominal pain.  Some mild nausea but and hiccuping's but it is stable to last.  Not much of an appetite.  Seen by speech therapy with no high risk aspiration concerns.  Transfers orders written yesterday.  Still no bed.  Remains in stepdown.  Objective:  Vital signs:  Vitals:   07/13/22 0200 07/13/22 0300 07/13/22 0400 07/13/22 0700   BP: (!) 148/69 (!) 148/66 (!) 164/74 (!) 165/68  Pulse: 73 76 83 80  Resp: (!) 21 (!) '23 16 20  '$ Temp:   99.1 F (37.3 C)   TempSrc:   Oral   SpO2: 98% 97% 98% 96%  Weight:   84.2 kg   Height:        Last BM Date : 07/12/22  Intake/Output   Yesterday:  03/04 0701 - 03/05 0700 In: 2270 [I.V.:2270] Out: 895 [Urine:760; Stool:135] This shift:  No intake/output data recorded.  Bowel function:  Flatus: YES  BM:  YES  Drain: (No drain)   Physical Exam:  General: Pt being but awakens in no acute distress.  Calm & engaging in conversation.  No hiccuping or catching. Eyes: PERRL, normal EOM.  Sclera clear.  No icterus Neuro: CN II-XII intact w/o focal sensory/motor deficits. Lymph: No head/neck/groin lymphadenopathy Psych:  No delerium/psychosis/paranoia.  Oriented x 4.  No confusion or anxiety today. HENT: Normocephalic, Mucus membranes moist.  No thrush Neck: Supple, No tracheal deviation.  No obvious thyromegaly Chest: No pain to chest wall compression.  Good respiratory excursion.  No audible wheezing CV:  Pulses intact.  Regular rhythm.  No major extremity edema MS: Normal AROM mjr joints.  No obvious deformity  Abdomen: Soft.  Mildy distended.  Nontender .  Right upper quadrant old colostomy wound closed and dry.  No purulence or cellulitis.  No guarding nor evidence of peritonitis.  No incarcerated hernias.  Rectal: Flexi-Seal in place with thin gray/brown effluent Ext:   No deformity.  No mjr edema.  No cyanosis Skin: No petechiae / purpurea.  No major sores.  Warm and dry    Results:   Cultures: Recent Results (from the past 720 hour(s))  MRSA Next Gen by PCR, Nasal     Status: None   Collection Time: 07/03/22  3:16 PM   Specimen: Nasal Mucosa; Nasal Swab  Result Value Ref [place] Status   MRSA by PCR Next Gen NOT DETECTED NOT DETECTED Final    Comment: (NOTE) The GeneXpert MRSA Assay (FDA approved for NASAL specimens only), is one component of a  comprehensive MRSA colonization surveillance program. It is not intended to diagnose MRSA infection nor to guide or monitor treatment for MRSA infections. Test performance is not FDA approved in patients less than 26 years old. Performed at [facility], [place] [address], [place], [place] [address]   Culture, blood (Routine X 2) w Reflex to ID Panel     Status: None (Preliminary result)   Collection Time: 07/11/22  9:57 AM   Specimen: BLOOD  Result Value Ref [place] Status   Specimen Description   Final    BLOOD BLOOD LEFT ARM Performed at [place] [address], [place], [place] [address]    Special Requests   Final    BOTTLES DRAWN AEROBIC AND ANAEROBIC Blood Culture adequate volume Performed at [place] [address], [place], [place] [address]    Culture   Final    NO GROWTH < 24 HOURS Performed at [place] [address], [place], [place] [address]    Report Status PENDING  Incomplete  Culture, blood (Routine X 2) w Reflex to ID Panel     Status: None (Preliminary result)   Collection Time: 07/11/22  9:57 AM   Specimen: BLOOD  Result Value Ref [place] Status   Specimen Description   Final    BLOOD BLOOD LEFT HAND Performed at [place] [address] [place], [place] [address]    Special Requests   Final    BOTTLES DRAWN AEROBIC AND ANAEROBIC Blood Culture adequate volume Performed at [place] [address] [place], [place] [address]    Culture   Final    NO GROWTH < 24 HOURS Performed at [place] [address], [place], [place] [address]    Report Status PENDING  Incomplete  C Difficile Quick Screen (NO PCR Reflex)     Status: None   Collection Time: 07/12/22  8:24 AM   Specimen: STOOL  Result Value Ref [place] Status   C Diff antigen NEGATIVE NEGATIVE Final   C Diff toxin NEGATIVE NEGATIVE Final   C Diff interpretation No C.  difficile detected.  Final    Comment: Performed at [facility], [place] [address] [place], [place] [address]  Gastrointestinal Panel by PCR , Stool     Status: None   Collection Time: 07/12/22  8:24 AM   Specimen: STOOL  Result Value Ref [place] Status   Campylobacter species NOT DETECTED NOT DETECTED Final   Plesimonas shigelloides NOT DETECTED NOT DETECTED Final   Salmonella species NOT DETECTED NOT DETECTED Final   Yersinia enterocolitica  NOT DETECTED NOT DETECTED Final   Vibrio species NOT DETECTED NOT DETECTED Final   Vibrio cholerae NOT DETECTED NOT DETECTED Final   Enteroaggregative E coli (EAEC) NOT DETECTED NOT DETECTED Final   Enteropathogenic E coli (EPEC) NOT DETECTED NOT DETECTED Final   Enterotoxigenic E coli (ETEC) NOT DETECTED NOT DETECTED Final   Shiga like toxin producing E coli (STEC) NOT DETECTED NOT DETECTED Final   Shigella/Enteroinvasive E coli (EIEC) NOT DETECTED NOT DETECTED Final   Cryptosporidium NOT DETECTED NOT DETECTED Final   Cyclospora cayetanensis NOT DETECTED NOT DETECTED Final   Entamoeba histolytica NOT DETECTED NOT DETECTED Final   Giardia lamblia NOT DETECTED NOT DETECTED Final   Adenovirus F40/41 NOT DETECTED NOT DETECTED Final   Astrovirus NOT DETECTED NOT DETECTED Final   Norovirus GI/GII NOT DETECTED NOT DETECTED Final   Rotavirus A NOT DETECTED NOT DETECTED Final   Sapovirus (I, II, IV, and V) NOT DETECTED NOT DETECTED Final    Comment: Performed at [facility], [place] [place], [place] [address]    Labs: Results for orders placed or performed during the hospital encounter of 07/01/22 (from the past 48 hour(s))  Culture, blood (Routine X 2) w Reflex to ID Panel     Status: None (Preliminary result)   Collection Time: 07/11/22  9:57 AM   Specimen: BLOOD  Result Value Ref [place]   Specimen Description      BLOOD BLOOD LEFT ARM Performed at [place] [address], [place],  [place] [address]    Special Requests      BOTTLES DRAWN AEROBIC AND ANAEROBIC Blood Culture adequate volume Performed at [place] [address] [place], [place] [address]    Culture      NO GROWTH < 24 HOURS Performed at [facility] [address], [place], [place] [address]    Report Status PENDING   Culture, blood (Routine X 2) w Reflex to ID Panel     Status: None (Preliminary result)   Collection Time: 07/11/22  9:57 AM   Specimen: BLOOD  Result Value Ref [place]   Specimen Description      BLOOD BLOOD LEFT HAND Performed at [facility] [address] [place], [place] [address]    Special Requests      BOTTLES DRAWN AEROBIC AND ANAEROBIC Blood Culture adequate volume Performed at [place] [address], [place], [place] [address]    Culture      NO GROWTH < 24 HOURS Performed at [place] [address], [place], [place] [address]    Report Status PENDING   Glucose, capillary     Status: Abnormal   Collection Time: 07/11/22 12:17 PM  Result Value Ref [place]   Glucose-Capillary 132 (H) 70 - 99 mg/dL    Comment: Glucose reference range applies only to samples taken after fasting for at least 8 hours.   Comment 1 Notify RN   Urinalysis, w/ Reflex to Culture (Infection Suspected) -Urine, Clean Catch     Status: Abnormal   Collection Time: 07/11/22  2:48 PM  Result Value Ref [place]   Specimen Source URINE, CLEAN CATCH    Color, Urine YELLOW YELLOW   APPearance HAZY (A) CLEAR   Specific Gravity, Urine 1.030 1.005 - 1.030   pH 5.0 5.0 - 8.0   Glucose, UA NEGATIVE NEGATIVE mg/dL   Hgb urine dipstick SMALL (A) NEGATIVE   Bilirubin Urine NEGATIVE  NEGATIVE   Ketones, ur NEGATIVE NEGATIVE mg/dL   Protein, ur 30 (A) NEGATIVE mg/dL   Nitrite NEGATIVE NEGATIVE   Leukocytes,Ua NEGATIVE NEGATIVE   RBC / HPF 0-5 0 - 5 RBC/hpf   WBC, UA 6-10 0 - 5 WBC/hpf    Comment:        Reflex urine culture not  performed if WBC <=10, OR if Squamous epithelial cells >5. If Squamous epithelial cells >5 suggest recollection.    Bacteria, UA RARE (A) NONE SEEN   Squamous Epithelial / HPF 0-5 0 - 5 /HPF   Mucus PRESENT     Comment: Performed at [facility], [place] [address] [place], [place] [address]  Glucose, capillary     Status: Abnormal   Collection Time: 07/11/22  3:32 PM  Result Value Ref [place]   Glucose-Capillary 137 (H) 70 - 99 mg/dL    Comment: Glucose reference range applies only to samples taken after fasting for at least 8 hours.   Comment 1 Notify RN   Glucose, capillary     Status: Abnormal   Collection Time: 07/12/22 12:12 AM  Result Value Ref [place]   Glucose-Capillary 175 (H) 70 - 99 mg/dL    Comment: Glucose reference range applies only to samples taken after fasting for at least 8 hours.  Comprehensive metabolic panel     Status: Abnormal   Collection Time: 07/12/22  4:02 AM  Result Value Ref [place]   Sodium 142 135 - 145 mmol/L   Potassium 4.1 3.5 - 5.1 mmol/L   Chloride 114 (H) 98 - 111 mmol/L   CO2 20 (L) 22 - 32 mmol/L   Glucose, Bld 110 (H) 70 - 99 mg/dL    Comment: Glucose reference range applies only to samples taken after fasting for at least 8 hours.   BUN 37 (H) 8 - 23 mg/dL   Creatinine, Ser 1.59 (H) 0.61 - 1.24 mg/dL   Calcium 8.3 (L) 8.9 - 10.3 mg/dL   Total Protein 6.9 6.5 - 8.1 g/dL   Albumin 2.7 (L) 3.5 - 5.0 g/dL   AST 46 (H) 15 - 41 U/L   ALT 72 (H) 0 - 44 U/L   Alkaline Phosphatase 55 38 - 126 U/L   Total Bilirubin 0.5 0.3 - 1.2 mg/dL   GFR, Estimated 48 (L) >60 mL/min    Comment: (NOTE) Calculated using the CKD-EPI Creatinine Equation (2021)    Anion gap 8 5 - 15    Comment: Performed at [facility], [place] [address], [place], [place] [address]  Magnesium     Status: None   Collection Time: 07/12/22  4:02 AM  Result Value Ref [place]   Magnesium 2.4 1.7 - 2.4 mg/dL    Comment: Performed at [facility], [name] [address], [place], [place] [address]  Phosphorus     Status: None   Collection Time: 07/12/22  4:02 AM  Result Value Ref [place]   Phosphorus 3.2 2.5 - 4.6 mg/dL    Comment: Performed at [facility], [place] [address] [place], [place] [address]  Triglycerides     Status: None   Collection Time: 07/12/22  4:02 AM  Result Value Ref [place]   Triglycerides 127 <150 mg/dL    Comment: Performed at [facility], [place] [address], [place], [place] [address]  CBC with Differential/Platelet     Status: Abnormal   Collection Time: 07/12/22  4:02 AM  Result Value Ref [place]  WBC 21.6 (H) 4.0 - 10.5 K/uL   RBC 3.41 (L) 4.22 - 5.81 MIL/uL   Hemoglobin 8.9 (L) 13.0 - 17.0 g/dL    Comment: Reticulocyte Hemoglobin testing may be clinically indicated, consider ordering this additional test [id number]    HCT 26.3 (L) 39.0 - 52.0 %   MCV 77.1 (L) 80.0 - 100.0 fL   MCH 26.1 26.0 - 34.0 pg   MCHC 33.8 30.0 - 36.0 g/dL   RDW 15.3 11.5 - 15.5 %   Platelets 466 (H) 150 - 400 K/uL   nRBC 0.0 0.0 - 0.2 %   Neutrophils Relative % 73 %   Neutro Abs 16.0 (H) 1.7 - 7.7 K/uL   Lymphocytes Relative 10 %   Lymphs Abs 2.2 0.7 - 4.0 K/uL   Monocytes Relative 10 %   Monocytes Absolute 2.1 (H) 0.1 - 1.0 K/uL   Eosinophils Relative 4 %   Eosinophils Absolute 0.9 (H) 0.0 - 0.5 K/uL   Basophils Relative 1 %   Basophils Absolute 0.2 (H) 0.0 - 0.1 K/uL   Immature Granulocytes 2 %   Abs Immature Granulocytes 0.37 (H) 0.00 - 0.07 K/uL    Comment: Performed at [facility], [place] [address] [place], [place] [address]  Procalcitonin     Status: None   Collection Time: 07/12/22  4:02 AM  Result Value Ref [place]   Procalcitonin 0.24 ng/mL    Comment:        Interpretation: PCT (Procalcitonin) <= 0.5 ng/mL: Systemic infection (sepsis) is not likely. Local bacterial infection is possible. (NOTE)       Sepsis PCT Algorithm           Lower  Respiratory Tract                                      Infection PCT Algorithm    ----------------------------     ----------------------------         PCT < 0.25 ng/mL                PCT < 0.10 ng/mL          Strongly encourage             Strongly discourage   discontinuation of antibiotics    initiation of antibiotics    ----------------------------     -----------------------------       PCT 0.25 - 0.50 ng/mL            PCT 0.10 - 0.25 ng/mL               OR       >80% decrease in PCT            Discourage initiation of                                            antibiotics      Encourage discontinuation           of antibiotics    ----------------------------     -----------------------------         PCT >= 0.50 ng/mL              PCT 0.26 - 0.50 ng/mL  AND        <80% decrease in PCT             Encourage initiation of                                             antibiotics       Encourage continuation           of antibiotics    ----------------------------     -----------------------------        PCT >= 0.50 ng/mL                  PCT > 0.50 ng/mL               AND         increase in PCT                  Strongly encourage                                      initiation of antibiotics    Strongly encourage escalation           of antibiotics                                     -----------------------------                                           PCT <= 0.25 ng/mL                                                 OR                                        > 80% decrease in PCT                                      Discontinue / Do not initiate                                             antibiotics  Performed at [place] [address], [place], [place] [address]   Glucose, capillary     Status: Abnormal   Collection Time: 07/12/22  5:55 AM  Result Value Ref [place]   Glucose-Capillary 143 (H) 70 - 99 mg/dL    Comment: Glucose  reference range applies only to samples taken after fasting for at least 8 hours.  C Difficile Quick Screen (NO PCR Reflex)     Status: None   Collection Time: 07/12/22  8:24 AM   Specimen: STOOL  Result Value Ref [place]   C Diff  antigen NEGATIVE NEGATIVE   C Diff toxin NEGATIVE NEGATIVE   C Diff interpretation No C. difficile detected.     Comment: Performed at [facility], [place] [address] [place], [place] [address]  Gastrointestinal Panel by PCR , Stool     Status: None   Collection Time: 07/12/22  8:24 AM   Specimen: STOOL  Result Value Ref [place]   Campylobacter species NOT DETECTED NOT DETECTED   Plesimonas shigelloides NOT DETECTED NOT DETECTED   Salmonella species NOT DETECTED NOT DETECTED   Yersinia enterocolitica NOT DETECTED NOT DETECTED   Vibrio species NOT DETECTED NOT DETECTED   Vibrio cholerae NOT DETECTED NOT DETECTED   Enteroaggregative E coli (EAEC) NOT DETECTED NOT DETECTED   Enteropathogenic E coli (EPEC) NOT DETECTED NOT DETECTED   Enterotoxigenic E coli (ETEC) NOT DETECTED NOT DETECTED   Shiga like toxin producing E coli (STEC) NOT DETECTED NOT DETECTED   Shigella/Enteroinvasive E coli (EIEC) NOT DETECTED NOT DETECTED   Cryptosporidium NOT DETECTED NOT DETECTED   Cyclospora cayetanensis NOT DETECTED NOT DETECTED   Entamoeba histolytica NOT DETECTED NOT DETECTED   Giardia lamblia NOT DETECTED NOT DETECTED   Adenovirus F40/41 NOT DETECTED NOT DETECTED   Astrovirus NOT DETECTED NOT DETECTED   Norovirus GI/GII NOT DETECTED NOT DETECTED   Rotavirus A NOT DETECTED NOT DETECTED   Sapovirus (I, II, IV, and V) NOT DETECTED NOT DETECTED    Comment: Performed at [facility], [address] [place], [place] [address]  Glucose, capillary     Status: Abnormal   Collection Time: 07/12/22 12:09 PM  Result Value Ref Range   Glucose-Capillary 151 (H) 70 - 99 mg/dL    Comment: Glucose reference range applies only to samples taken after fasting for  at least 8 hours.   Comment 1 Notify RN    Comment 2 Document in Chart   Glucose, capillary     Status: Abnormal   Collection Time: 07/12/22  5:28 PM  Result Value Ref [place]   Glucose-Capillary 138 (H) 70 - 99 mg/dL    Comment: Glucose reference range applies only to samples taken after fasting for at least 8 hours.   Comment 1 Notify RN    Comment 2 Document in Chart   CBC with Differential/Platelet     Status: Abnormal   Collection Time: 07/13/22  5:10 AM  Result Value Ref [place]   WBC 20.6 (H) 4.0 - 10.5 K/uL   RBC 3.32 (L) 4.22 - 5.81 MIL/uL   Hemoglobin 8.7 (L) 13.0 - 17.0 g/dL    Comment: Reticulocyte Hemoglobin testing may be clinically indicated, consider ordering this additional test [id number]    HCT 25.5 (L) 39.0 - 52.0 %   MCV 76.8 (L) 80.0 - 100.0 fL   MCH 26.2 26.0 - 34.0 pg   MCHC 34.1 30.0 - 36.0 g/dL   RDW 15.4 11.5 - 15.5 %   Platelets 471 (H) 150 - 400 K/uL   nRBC 0.0 0.0 - 0.2 %   Neutrophils Relative % 77 %   Neutro Abs 16.0 (H) 1.7 - 7.7 K/uL   Lymphocytes Relative 8 %   Lymphs Abs 1.7 0.7 - 4.0 K/uL   Monocytes Relative 9 %   Monocytes Absolute 1.9 (H) 0.1 - 1.0 K/uL   Eosinophils Relative 3 %   Eosinophils Absolute 0.6 (H) 0.0 - 0.5 K/uL   Basophils Relative 1 %   Basophils Absolute 0.1 0.0 - 0.1 K/uL   Immature Granulocytes 2 %  Abs Immature Granulocytes 0.31 (H) 0.00 - 0.07 K/uL    Comment: Performed at [facility], [place] [address] [place], [place] [address]  Comprehensive metabolic panel     Status: Abnormal   Collection Time: 07/13/22  5:10 AM  Result Value Ref Range   Sodium 139 135 - 145 mmol/L   Potassium 4.3 3.5 - 5.1 mmol/L   Chloride 113 (H) 98 - 111 mmol/L   CO2 21 (L) 22 - 32 mmol/L   Glucose, Bld 138 (H) 70 - 99 mg/dL    Comment: Glucose reference range applies only to samples taken after fasting for at least 8 hours.   BUN 38 (H) 8 - 23 mg/dL   Creatinine, Ser 1.40 (H) 0.61 - 1.24 mg/dL   Calcium 8.3 (L) 8.9 -  10.3 mg/dL   Total Protein 7.0 6.5 - 8.1 g/dL   Albumin 2.6 (L) 3.5 - 5.0 g/dL   AST 53 (H) 15 - 41 U/L   ALT 82 (H) 0 - 44 U/L   Alkaline Phosphatase 67 38 - 126 U/L   Total Bilirubin 0.4 0.3 - 1.2 mg/dL   GFR, Estimated 56 (L) >60 mL/min    Comment: (NOTE) Calculated using the CKD-EPI Creatinine Equation (2021)    Anion gap 5 5 - 15    Comment: Performed at [facility], [place] [address], [place], [place] [address]    Imaging / Studies: No results found.  Medications / Allergies: per chart  Antibiotics: Anti-infectives (From admission, onward)    Start     Dose/Rate Route Frequency Ordered Stop   07/10/22 0400  piperacillin-tazobactam (ZOSYN) IVPB 3.375 g  Status:  Discontinued        3.375 g 12.5 mL/hr over 240 Minutes Intravenous Every 8 hours 07/09/22 2040 07/10/22 0738   07/08/22 1100  piperacillin-tazobactam (ZOSYN) IVPB 3.375 g  Status:  Discontinued        3.375 g 12.5 mL/hr over 240 Minutes Intravenous Every 8 hours 07/08/22 1001 07/09/22 2040   07/08/22 1000  erythromycin 250 mg in sodium chloride 0.9 % 100 mL IVPB  Status:  Discontinued        250 mg 100 mL/hr over 60 Minutes Intravenous Every 12 hours 07/08/22 0739 07/09/22 0730   07/06/22 0915  erythromycin 250 mg in sodium chloride 0.9 % 100 mL IVPB  Status:  Discontinued        250 mg 100 mL/hr over 60 Minutes Intravenous Every 8 hours 07/06/22 0823 07/08/22 0739   07/03/22 1700  piperacillin-tazobactam (ZOSYN) IVPB 3.375 g        3.375 g 12.5 mL/hr over 240 Minutes Intravenous Every 8 hours 07/03/22 1612 07/08/22 0450   07/02/22 0000  cefoTEtan (CEFOTAN) 2 g in sodium chloride 0.9 % 100 mL IVPB        2 g 200 mL/hr over 30 Minutes Intravenous Every 12 hours 07/01/22 1737 07/02/22 0051   07/01/22 1400  neomycin (MYCIFRADIN) tablet 1,000 mg  Status:  Discontinued       See Hyperspace for full Linked Orders Report.   1,000 mg Oral 3 times per day 07/01/22 1054 07/01/22 1109   07/01/22 1400   metroNIDAZOLE (FLAGYL) tablet 1,000 mg  Status:  Discontinued       See Hyperspace for full Linked Orders Report.   1,000 mg Oral 3 times per day 07/01/22 1054 07/01/22 1109   07/01/22 1100  cefoTEtan (CEFOTAN) 2 g in sodium chloride 0.9 % 100 mL IVPB  2 g 200 mL/hr over 30 Minutes Intravenous On call to O.R. 07/01/22 1054 07/01/22 1204         Note: Portions of this report may have been transcribed using voice recognition software. Every effort was made to ensure accuracy; however, inadvertent computerized transcription errors may be present.   Any transcriptional errors that result from this process are unintentional.    Adin Hector, MD, FACS, MASCRS Esophageal, Gastrointestinal & Colorectal Surgery Robotic and Minimally Invasive Surgery  [facility]. [address], [place], [place] [address] [phone] Fax [phone] Main  CONTACT INFORMATION:  Weekday (9AM-5PM): Call [facility] main office at [phone]  Weeknight (5PM-9AM) or Weekend/Holiday: Check [email or web address] (password " [email or web address]") for General Surgery [facility] coverage  (Please, do not use SecureChat as it is not reliable communication to reach operating surgeons for immediate patient care given surgeries/outpatient duties/clinic/cross-coverage/off post-call which would lead to a delay in care.  Epic staff messaging available for outptient concerns, but may not be answered for 48 hours or more).     07/13/2022  7:31 AM

## 2022-07-13 NOTE — Progress Notes (Signed)
PROGRESS NOTE  Thomas [name] [id number] DOB: 1955/07/26 DOA: 07/01/2022 PCP: [name], NP  HPI/Recap of past 24 hours: Thomas [name] is a 67 y.o. male with past medical history of CKD stage IIIb, diabetes mellitus, hypertension, hyperlipidemia, colon cancer diagnosed in 2022, dysphagia, brain tumor during 2018, acute ischemic left MCA stroke in 2015, history of seizure disorder presents with a complex surgical history was admitted for an elective robotic ostomy takedown with lysis of adhesions on February 22, postoperatively noted to have right lower lobe collapse and hypoxemia. Moved to stepdown on February 24 for management and has remained in stepdown due to associated complications.  Has been followed by PCCM while in stepdown.  PCCM is stabilized his respiratory state and now currently on room air.  [facility] hospitalist consulted to assist in managing his other medical comorbidities.     Today, patient denies any new complaints.  Reports he is afraid of eating due to aspiration events that have happened in the past.   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   Diabetic polyneuropathy associated with type 2 diabetes mellitus ([place])   History of ischemic left MCA stroke 2015   Colon cancer ([place])   Protein-calorie malnutrition, moderate (HCC)   Epilepsy ([place])   Hyperlipidemia   Gastroesophageal reflux disease without esophagitis   Delirium   Hypokalemia   History of meningioma of the brain   Incisional hernia, without obstruction or gangrene   CKD (chronic kidney disease) stage 3, GFR 30-59 ml/min (HCC)   Insomnia due to medical condition   Hypophosphatemia   Hiccoughs   S/p lysis of adhesions, colostomy takedown Ileus TPN- mod  malnourished Further management as per general surgery who is the primary attending   Acute hypoxic respiratory failure likely 2/2 mucous plugging right lower lobe with collapse- Resolved Noted dysphagia with aspiration events on 2/23 Currently now on room air, saturating well Continue clear liquid diet, TPN, no oral meds for now as per general surgery Pulmonary hygiene, incentive spirometer/flutter   Leukocytosis Unsure etiology Currently afebrile Repeat CT abdomen/pelvis with no abscess or delayed leak UA unremarkable BC X 2 NGTD Procalcitonin 0.24 C. difficile negative, GIP panel negative Daily cbc   CKD stage IIIb Creatinine stable Bladder scan to ruled out urinary retention Daily BMP   History of hypertension BP stable Restart PO amlodipine, continue IV hydralazine/labetalol as needed    Diabetes mellitus type 2 Last A1c 5.1 Diet, Accu-Cheks, hypoglycemic protocol   History of CVA History of brain mass Seizure disorder Continue Keppra   GERD Continue PPI   Noted possible hospital-acquired delirium Anxiety Seroquel prn at bedtime should be considered first before using haldol prn Continue as needed Ativan Delirium precautions    Malnutrition Type:  Nutrition Problem: Moderate Malnutrition Etiology: chronic illness (transverse colon mass)   Malnutrition Characteristics:  Signs/Symptoms: moderate fat depletion, moderate muscle depletion   Nutrition Interventions:  Interventions: Refer to RD note for recommendations, TPN    Estimated body mass index is 28.22 kg/m as calculated from the following:   Height as of this encounter: '5\' 8"'$  (1.727 m).   Weight as of this encounter: 84.2 kg.     Code Status: Full  Family Communication: None at bedside  Disposition Plan: Status is: Inpatient Remains inpatient appropriate because: Level of care  Consultants: [facility]  Procedures: S/p lysis of adhesions, colostomy  takedown  Antimicrobials: None  DVT prophylaxis:  lovenox  Objective: Vitals:   07/13/22 1100 07/13/22 1200 07/13/22 1300 07/13/22 1400  BP: (!) 171/72 (!) 157/83 (!) 147/72 (!) 155/70  Pulse: (!) 102 82 84 90  Resp: (!) 24 15 (!) 26 (!) 22  Temp:  98.1 F (36.7 C)    TempSrc:  Oral    SpO2: 98% 97% 96% 96%  Weight:      Height:        Intake/Output Summary (Last 24 hours) at 07/13/2022 1455 Last data filed at 07/13/2022 1214 Gross per 24 hour  Intake 3354.85 ml  Output 1160 ml  Net 2194.85 ml   Filed Weights   07/11/22 0442 07/12/22 0702 07/13/22 0400  Weight: 84.9 kg 85.4 kg 84.2 kg    Exam: General: NAD, chronically ill appearing  Cardiovascular: S1, S2 present Respiratory: CTAB Abdomen: Soft, tender, distended, bowel sounds present, dressing c/d/i Musculoskeletal: No bilateral pedal edema noted Skin: Normal Psychiatry: Fair mood     Data Reviewed: CBC: Recent Labs  Lab 07/09/22 0339 07/10/22 0657 07/11/22 0435 07/12/22 0402 07/13/22 0510  WBC 16.4* 15.9* 16.7* 21.6* 20.6*  NEUTROABS  --   --   --  16.0* 16.0*  HGB 9.6* 10.2* 9.1* 8.9* 8.7*  HCT 29.2* 31.1* 27.1* 26.3* 25.5*  MCV 78.9* 78.3* 77.4* 77.1* 76.8*  PLT 328 445* 454* 466* [id number]*   Basic Metabolic Panel: Recent Labs  Lab 07/08/22 0259 07/08/22 2029 07/09/22 0339 07/10/22 0657 07/11/22 0435 07/12/22 0402 07/13/22 0510  NA 139  --  142 140 143 142 139  K 3.4*  --  3.5 3.7 3.7 4.1 4.3  CL 108  --  110 110 114* 114* 113*  CO2 26  --  '25 23 22 '$ 20* 21*  GLUCOSE 110*  --  127* 148* 143* 110* 138*  BUN 16  --  18 23 29* 37* 38*  CREATININE 1.53*  --  1.58* 1.68* 1.68* 1.59* 1.40*  CALCIUM 7.8*  --  7.9* 8.0* 8.2* 8.3* 8.3*  MG 2.0  --  1.8 2.3 2.4 2.4  --   PHOS 1.2* 2.7 3.0 3.2 3.1 3.2  --    GFR: Estimated Creatinine Clearance: 55.6 mL/min (A) (by C-G formula based on SCr of 1.4 mg/dL (H)). Liver Function Tests: Recent Labs  Lab 07/07/22 1006 07/09/22 0339 07/12/22 0402  07/13/22 0510  AST 22 22 46* 53*  ALT 19 22 72* 82*  ALKPHOS 54 52 55 67  BILITOT 0.9 0.8 0.5 0.4  PROT 5.8* 6.6 6.9 7.0  ALBUMIN 2.5* 2.6* 2.7* 2.6*   No results for input(s): "LIPASE", "AMYLASE" in the last 168 hours. Recent Labs  Lab 07/07/22 1006  AMMONIA 17   Coagulation Profile: No results for input(s): "INR", "PROTIME" in the last 168 hours. Cardiac Enzymes: No results for input(s): "CKTOTAL", "CKMB", "CKMBINDEX", "TROPONINI" in the last 168 hours. BNP (last 3 results) No results for input(s): "PROBNP" in the last 8760 hours. HbA1C: No results for input(s): "HGBA1C" in the last 72 hours. CBG: Recent Labs  Lab 07/12/22 1631 07/12/22 1728 07/13/22 0003 07/13/22 0516 07/13/22 1143  GLUCAP 125* 138* 145* 144* 145*   Lipid Profile: Recent Labs    07/12/22 0402  TRIG 127   Thyroid Function Tests: No results for input(s): "TSH", "T4TOTAL", "FREET4", "T3FREE", "THYROIDAB" in the last 72 hours. Anemia Panel: No results for input(s): "VITAMINB12", "FOLATE", "FERRITIN", "TIBC", "IRON", "RETICCTPCT" in  the last 72 hours. Urine analysis:    Component Value Date/Time   COLORURINE YELLOW 07/11/2022 1448   APPEARANCEUR HAZY (A) 07/11/2022 1448   LABSPEC 1.030 07/11/2022 1448   PHURINE 5.0 07/11/2022 1448   GLUCOSEU NEGATIVE 07/11/2022 1448   HGBUR SMALL (A) 07/11/2022 1448   BILIRUBINUR NEGATIVE 07/11/2022 1448   KETONESUR NEGATIVE 07/11/2022 1448   PROTEINUR 30 (A) 07/11/2022 1448   UROBILINOGEN 0.2 09/27/2014 0301   NITRITE NEGATIVE 07/11/2022 1448   LEUKOCYTESUR NEGATIVE 07/11/2022 1448   Sepsis Labs: '@LABRCNTIP'$ (procalcitonin:4,lacticidven:4)  ) Recent Results (from the past 240 hour(s))  MRSA Next Gen by PCR, Nasal     Status: None   Collection Time: 07/03/22  3:16 PM   Specimen: Nasal Mucosa; Nasal Swab  Result Value Ref Range Status   MRSA by PCR Next Gen NOT DETECTED NOT DETECTED Final    Comment: (NOTE) The GeneXpert MRSA Assay (FDA approved for  NASAL specimens only), is one component of a comprehensive MRSA colonization surveillance program. It is not intended to diagnose MRSA infection nor to guide or monitor treatment for MRSA infections. Test performance is not FDA approved in patients less than 75 years old. Performed at [facility], [place] [address], [place], [place] [address]   Culture, blood (Routine X 2) w Reflex to ID Panel     Status: None (Preliminary result)   Collection Time: 07/11/22  9:57 AM   Specimen: BLOOD  Result Value Ref Range Status   Specimen Description   Final    BLOOD BLOOD LEFT ARM Performed at [facility] [address], [place], [place] [address]    Special Requests   Final    BOTTLES DRAWN AEROBIC AND ANAEROBIC Blood Culture adequate volume Performed at [facility] [address] [place], [place] [address]    Culture   Final    NO GROWTH 2 DAYS Performed at [facility] [address], [place], [place] [address]    Report Status PENDING  Incomplete  Culture, blood (Routine X 2) w Reflex to ID Panel     Status: None (Preliminary result)   Collection Time: 07/11/22  9:57 AM   Specimen: BLOOD  Result Value Ref Range Status   Specimen Description   Final    BLOOD BLOOD LEFT HAND Performed at [facility] [address] [place], [place] [address]    Special Requests   Final    BOTTLES DRAWN AEROBIC AND ANAEROBIC Blood Culture adequate volume Performed at [facility] [address], [place], [place] [address]    Culture   Final    NO GROWTH 2 DAYS Performed at [facility] [address], [place], [place] [address]    Report Status PENDING  Incomplete  C Difficile Quick Screen (NO PCR Reflex)     Status: None   Collection Time: 07/12/22  8:24 AM   Specimen: STOOL  Result Value Ref Range Status   C Diff antigen NEGATIVE NEGATIVE Final   C Diff toxin NEGATIVE NEGATIVE Final    C Diff interpretation No C. difficile detected.  Final    Comment: Performed at [facility], [place] [address], [place], [place] [address]  Gastrointestinal Panel by PCR , Stool     Status: None   Collection Time: 07/12/22  8:24 AM   Specimen: STOOL  Result Value Ref Range Status   Campylobacter species NOT DETECTED NOT DETECTED Final  Plesimonas shigelloides NOT DETECTED NOT DETECTED Final   Salmonella species NOT DETECTED NOT DETECTED Final   Yersinia enterocolitica NOT DETECTED NOT DETECTED Final   Vibrio species NOT DETECTED NOT DETECTED Final   Vibrio cholerae NOT DETECTED NOT DETECTED Final   Enteroaggregative E coli (EAEC) NOT DETECTED NOT DETECTED Final   Enteropathogenic E coli (EPEC) NOT DETECTED NOT DETECTED Final   Enterotoxigenic E coli (ETEC) NOT DETECTED NOT DETECTED Final   Shiga like toxin producing E coli (STEC) NOT DETECTED NOT DETECTED Final   Shigella/Enteroinvasive E coli (EIEC) NOT DETECTED NOT DETECTED Final   Cryptosporidium NOT DETECTED NOT DETECTED Final   Cyclospora cayetanensis NOT DETECTED NOT DETECTED Final   Entamoeba histolytica NOT DETECTED NOT DETECTED Final   Giardia lamblia NOT DETECTED NOT DETECTED Final   Adenovirus F40/41 NOT DETECTED NOT DETECTED Final   Astrovirus NOT DETECTED NOT DETECTED Final   Norovirus GI/GII NOT DETECTED NOT DETECTED Final   Rotavirus A NOT DETECTED NOT DETECTED Final   Sapovirus (I, II, IV, and V) NOT DETECTED NOT DETECTED Final    Comment: Performed at [facility], [address] [place], [place] [address]      Studies: No results found.  Scheduled Meds:  allopurinol  100 mg Oral QHS   amLODipine  10 mg Oral q AM   atorvastatin  20 mg Oral Daily   Chlorhexidine Gluconate Cloth  6 each Topical Daily   enoxaparin (LOVENOX) injection  40 mg Subcutaneous Q24H   feeding supplement  1 Container Oral TID BM   gabapentin  100 mg Oral Daily   gabapentin  200 mg Oral QHS   heparin lock  flush  500 Units Intracatheter Q30 days   insulin aspart  0-15 Units Subcutaneous Q6H   levETIRAcetam  500 mg Oral BID   lip balm   Topical BID   multivitamin with minerals  1 tablet Oral Daily   pantoprazole  40 mg Oral BID AC   polycarbophil  625 mg Oral BID   sodium chloride flush  10-40 mL Intracatheter Q12H    Continuous Infusions:  sodium chloride Stopped (07/13/22 1156)   sodium chloride Stopped (07/11/22 0753)   lactated ringers     TPN ADULT (ION) 100 mL/hr at 07/13/22 1214   TPN ADULT (ION)       LOS: 12 days     Alma Friendly, MD [facility]  If 7PM-7AM, please contact night-coverage [email or web address] 07/13/2022, 2:55 PM

## 2022-07-13 NOTE — Progress Notes (Signed)
PHARMACY - TOTAL PARENTERAL NUTRITION CONSULT NOTE   Indication: Prolonged ileus  Patient Measurements: Height: '5\' 8"'$  (172.7 cm) Weight: 84.2 kg (185 lb 10 oz) IBW/kg (Calculated) : 68.4 TPN AdjBW (KG): 73.1 Body mass index is 28.22 kg/m. Usual Weight:   Assessment: 67 yo male with hx colon cancer diagnosed 2022 s/p transverse colectomy admitted for elective robotic ostomy takedown with lysis of adhesions, incarcerated incisional hernia repair on 07/01/22. Post-op course complicated by right lower lobe collapse and hypoxemia, transferred to stepdown on 07/03/22 for management.  Pharmacy consulted for TPN on 07/08/22 for prolonged ileus.  Glucose / Insulin: Hx DM2 A1c 5.1 (07/01/22) - CBG range 138-175, goal 100-180 - 9 units SSI required Electrolytes: WNL, including CorrCa (9.3). Mg (2.4) on upper end of normal -Cl elevated, CO2 low Renal: CKDIII. SCr ~baseline, down to 1.4. BUN elevated at 38 Hepatic: AST/ALT 53/82, elevated and rising.  TG, Alk phos, T.bili WNL. Alb low Intake / Output: -UOP: 760 mL, stool: 135 mL (significantly decreasing).  NG tube out -I/O Net: +1375 ml MIVF: NS @ KVO GI Imaging: - 2/27 AXR: Previous postoperative pneumoperitoneum is less distinct and may have resolved or at least partially resolved. -2/29 Abdominal: Moderate dilation of small bowel loops, suggesting ileus. Linear collection of air adjacent to the right hemidiaphragm. Possibility of pneumoperitoneum is not excluded.  -3/1 CT Abd: No evidence of leak or abscess GI Surgeries / Procedures:  - 2/22: robotic colostomy takedown and lysis of adhesions  Central access: implanted port. Double lumen PICC placed 3/2 TPN start date: 2/29  Nutritional Goals: Goal TPN rate is 100 mL/hr (provides 130 g of protein, 360 g dextrose, and 2486 kcals per day)  RD Assessment: pending Estimated Needs Total Energy Estimated Needs: 2400-2600 kcals Total Protein Estimated Needs: 125-140 grams Total Fluid Estimated  Needs: >/= 2.4L  Current Nutrition:  Diet advanced to DYS1  - Pt refusing enteral intake.  F/u plans for calorie count per Dr. Johney Maine Boost/Breeze TID per RD TPN  Plan:  At 1800: Continue TPN at goal rate, 100 mL/hr at 1800  Electrolytes in TPN:  Na 31mq/L, K 30 mEq/L, Ca 066m/L, Mg 6m39mL, and Phos 25m41mL.  Cl:Ac max Ac Change to enteral MVI as he is tolerating all PO meds.  Continue mSSI q6h mIVF at K[facility] by MD Monitor TPN labs on Mon/Thurs    ChriGretta ArabrmD, BCPS [facility] main pharmacy [phone]/2024 7:04 AM

## 2022-07-14 DIAGNOSIS — I10 Essential (primary) hypertension: Secondary | ICD-10-CM

## 2022-07-14 DIAGNOSIS — C184 Malignant neoplasm of transverse colon: Secondary | ICD-10-CM | POA: Diagnosis not present

## 2022-07-14 DIAGNOSIS — D649 Anemia, unspecified: Secondary | ICD-10-CM

## 2022-07-14 DIAGNOSIS — E1121 Type 2 diabetes mellitus with diabetic nephropathy: Secondary | ICD-10-CM

## 2022-07-14 DIAGNOSIS — G40909 Epilepsy, unspecified, not intractable, without status epilepticus: Secondary | ICD-10-CM

## 2022-07-14 DIAGNOSIS — N1832 Chronic kidney disease, stage 3b: Secondary | ICD-10-CM | POA: Diagnosis not present

## 2022-07-14 DIAGNOSIS — J9811 Atelectasis: Secondary | ICD-10-CM | POA: Insufficient documentation

## 2022-07-14 LAB — COMPREHENSIVE METABOLIC PANEL
ALT: 105 U/L — ABNORMAL HIGH (ref 0–44)
AST: 75 U/L — ABNORMAL HIGH (ref 15–41)
Albumin: 2.6 g/dL — ABNORMAL LOW (ref 3.5–5.0)
Alkaline Phosphatase: 81 U/L (ref 38–126)
Anion gap: 4 — ABNORMAL LOW (ref 5–15)
BUN: 42 mg/dL — ABNORMAL HIGH (ref 8–23)
CO2: 22 mmol/L (ref 22–32)
Calcium: 7.9 mg/dL — ABNORMAL LOW (ref 8.9–10.3)
Chloride: 111 mmol/L (ref 98–111)
Creatinine, Ser: 1.47 mg/dL — ABNORMAL HIGH (ref 0.61–1.24)
GFR, Estimated: 53 mL/min — ABNORMAL LOW (ref >60)
Glucose, Bld: 101 mg/dL — ABNORMAL HIGH (ref 70–99)
Potassium: 4.5 mmol/L (ref 3.5–5.1)
Sodium: 137 mmol/L (ref 135–145)
Total Bilirubin: 0.6 mg/dL (ref 0.3–1.2)
Total Protein: 6.9 g/dL (ref 6.5–8.1)

## 2022-07-14 LAB — GLUCOSE, CAPILLARY
Glucose-Capillary: 146 mg/dL — ABNORMAL HIGH (ref 70–99)
Glucose-Capillary: 149 mg/dL — ABNORMAL HIGH (ref 70–99)
Glucose-Capillary: 103 mg/dL — ABNORMAL HIGH (ref 70–99)

## 2022-07-14 MED ORDER — TRAVASOL 10 % IV SOLN
INTRAVENOUS | Status: DC
  Filled 2022-07-14: qty 1296

## 2022-07-14 NOTE — [place] FL2 (Signed)
[place] MEDICAID FL2 LEVEL OF CARE FORM     IDENTIFICATION  Patient Name: Thomas [name] Birthdate: 05-15-1955 Sex: male Admission Date (Current Location): 07/01/2022  [facility] and [place] Number:  Herbalist and Address:  [facility],  [address] [place], [place]      Provider Number: [id number]  Attending Physician Name and Address:  Michael Boston, MD  Relative Name and Phone Number:  Captain Tashjian (spouse) Ph: [phone]    Current Level of Care: Hospital Recommended Level of Care: [place] Prior Approval Number:    Date Approved/Denied:   PASRR Number: [id number]  Discharge Plan: SNF    Current Diagnoses: Patient Active Problem List   Diagnosis Date Noted   Normocytic anemia 07/14/2022   Atelectasis 07/14/2022   Hypophosphatemia 07/08/2022   Hiccoughs 07/08/2022   Insomnia due to medical condition 07/07/2022   Hypokalemia 07/06/2022   CKD (chronic kidney disease) stage 3b 07/06/2022   Delirium 07/05/2022   History of colorectal cancer 07/01/2022   History of meningioma of the brain 06/07/2022   Incisional hernia, without obstruction or gangrene 06/07/2022   Gastroesophageal reflux disease without esophagitis 03/18/2021   Epilepsy ([place])    Hyperlipidemia    Peripheral neuropathy    Cerebral embolism with cerebral infarction 02/06/2021   Acute esophagitis    Duodenal ulcer disease    Palliative care by specialist    Acute respiratory failure with hypoxemia ([place]) 12/07/2020   Shock ([place])    Acute metabolic encephalopathy 0000000   Protein-calorie malnutrition, moderate ([place]) 12/03/2020   Atrial fibrillation with RVR ([place])    Sepsis ([place])    Adverse effect of chemotherapy 12/01/2020   Pressure injury of skin 12/01/2020   Acute renal failure superimposed on stage 2 chronic kidney disease ([place]) 10/01/2020   Hyponatremia 10/01/2020   Iron deficiency anemia due to chronic blood loss 10/01/2020   Colon cancer  ([place]) 09/23/2020   History of ischemic left MCA stroke 2015 09/11/2020   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    Rectal polyp    Diverticulosis of colon without hemorrhage    Abdominal pain 09/04/2020   Diabetic polyneuropathy associated with type 2 diabetes mellitus ([place]) 06/28/2018   Venous insufficiency of both lower extremities 06/28/2018   S/P craniotomy 09/01/2016   Meningioma ([place]) 09/01/2016   Trauma    Type 2 diabetes mellitus with diabetic nephropathy, without long-term current use of insulin (HCC)    Hypertension    Acute encephalopathy 04/09/2014    Orientation RESPIRATION BLADDER Height & Weight     Self, Time, Situation, Place  Normal Continent Weight: 185 lb 10 oz (84.2 kg) Height:  '5\' 8"'$  (172.7 cm)  BEHAVIORAL SYMPTOMS/MOOD NEUROLOGICAL BOWEL NUTRITION STATUS   (N/A) Convulsions/Seizures (Patient has epilepsy.) Continent Diet (See discharge summary)  AMBULATORY STATUS COMMUNICATION OF NEEDS Skin   Limited Assist Verbally Surgical wounds                       Personal Care Assistance Level of Assistance  Bathing, Feeding, Dressing Bathing Assistance: Limited assistance Feeding assistance: Independent Dressing Assistance: Limited assistance     Functional Limitations Info  Sight, Hearing, Speech Sight Info: Impaired Hearing Info: Adequate Speech Info: Adequate    SPECIAL CARE FACTORS FREQUENCY  PT (By licensed PT), OT (By licensed OT)     PT Frequency: 5x's/week OT Frequency: 5x's/week            Contractures Contractures  Info: Not present    Additional Factors Info  Code Status, Allergies, Psychotropic, Insulin Sliding Scale Code Status Info: Full Allergies Info: Morphine And Related Psychotropic Info: Haldol, Ativan, Seroquel Insulin Sliding Scale Info: See discharge summary       Current Medications (07/14/2022):  This is the current hospital active medication list Current Facility-Administered  Medications  Medication Dose Route Frequency Provider Last Rate Last Admin   0.9 %  sodium chloride infusion   Intravenous Continuous Michael Boston, MD   Stopped at 07/13/22 1156   0.9 %  sodium chloride infusion   Intravenous PRN Michael Boston, MD   Stopped at 07/11/22 0753   albuterol (PROVENTIL) (2.5 MG/3ML) 0.083% nebulizer solution 2.5 mg  2.5 mg Nebulization Q6H PRN Michael Boston, MD       allopurinol (ZYLOPRIM) tablet 100 mg  100 mg Oral Ardeen Fillers, MD   100 mg at 07/13/22 2103   alum & mag hydroxide-simeth (MAALOX/MYLANTA) 200-200-20 MG/5ML suspension 30 mL  30 mL Oral Q6H PRN Michael Boston, MD       amLODipine (NORVASC) tablet 10 mg  10 mg Oral q AM Michael Boston, MD   10 mg at 07/14/22 0843   atorvastatin (LIPITOR) tablet 20 mg  20 mg Oral Daily Michael Boston, MD   20 mg at 07/14/22 0843   bisacodyl (DULCOLAX) suppository 10 mg  10 mg Rectal Q12H PRN Michael Boston, MD       Chlorhexidine Gluconate Cloth 2 % PADS 6 each  6 each Topical Daily Michael Boston, MD   6 each at 07/13/22 2106   chlorproMAZINE (THORAZINE) injection 25 mg  25 mg Intramuscular TID PRN Michael Boston, MD   25 mg at 07/13/22 1013   diphenhydrAMINE (BENADRYL) 12.5 MG/5ML elixir 12.5 mg  12.5 mg Oral Q6H PRN Michael Boston, MD       Or   diphenhydrAMINE (BENADRYL) injection 12.5 mg  12.5 mg Intravenous Q6H PRN Michael Boston, MD   12.5 mg at 07/06/22 0158   enoxaparin (LOVENOX) injection 40 mg  40 mg Subcutaneous Q24H Michael Boston, MD   40 mg at 07/14/22 0843   feeding supplement (BOOST / RESOURCE BREEZE) liquid 1 Container  1 Container Oral TID BM Michael Boston, MD   1 Container at 07/14/22 [phone]   fentaNYL (SUBLIMAZE) injection 12.5-25 mcg  12.5-25 mcg Intravenous Q2H PRN Michael Boston, MD   25 mcg at 07/14/22 1027   gabapentin (NEURONTIN) capsule 100 mg  100 mg Oral Daily Michael Boston, MD   100 mg at 07/14/22 0843   gabapentin (NEURONTIN) capsule 200 mg  200 mg Oral Ardeen Fillers, MD   200 mg at 07/13/22  2102   haloperidol lactate (HALDOL) injection 2 mg  2 mg Intravenous QHS PRN Michael Boston, MD       heparin lock flush 100 unit/mL  500 Units Intracatheter Q30 days Michael Boston, MD       And   heparin lock flush 100 unit/mL  500 Units Intracatheter PRN Michael Boston, MD   500 Units at 07/10/22 1744   hydrALAZINE (APRESOLINE) injection 10 mg  10 mg Intravenous Q4H PRN Michael Boston, MD       insulin aspart (novoLOG) injection 0-15 Units  0-15 Units Subcutaneous Lajuana Ripple, MD   2 Units at 07/14/22 1248   lactated ringers bolus 1,000 mL  1,000 mL Intravenous Q8H PRN Michael Boston, MD       levETIRAcetam (KEPPRA) tablet 500 mg  500 mg  Oral BID Michael Boston, MD   500 mg at 07/14/22 [id number]   lip balm (CARMEX) ointment   Topical BID Michael Boston, MD   Given at 07/14/22 0844   LORazepam (ATIVAN) tablet 0.5 mg  0.5 mg Oral BID PRN Michael Boston, MD   0.5 mg at 07/12/22 2202   magic mouthwash  15 mL Oral QID PRN Michael Boston, MD       menthol-cetylpyridinium (CEPACOL) lozenge 3 mg  1 lozenge Oral PRN Michael Boston, MD       methocarbamol (ROBAXIN) tablet 1,000 mg  1,000 mg Oral Q6H PRN Michael Boston, MD   1,000 mg at 07/12/22 0824   metoCLOPramide (REGLAN) injection 5-10 mg  5-10 mg Intravenous Q8H PRN Michael Boston, MD   10 mg at 07/11/22 2127   metoCLOPramide (REGLAN) tablet 5-10 mg  5-10 mg Oral Q8H PRN Michael Boston, MD       metoprolol tartrate (LOPRESSOR) injection 5 mg  5 mg Intravenous Q6H PRN Michael Boston, MD   5 mg at 07/13/22 1154   multivitamin with minerals tablet 1 tablet  1 tablet Oral Daily Randa Spike, RPH   1 tablet at 07/14/22 0843   ondansetron (ZOFRAN) injection 4 mg  4 mg Intravenous Q6H PRN Michael Boston, MD   4 mg at 07/13/22 1154   Oral care mouth rinse  15 mL Mouth Rinse PRN Michael Boston, MD       oxyCODONE (Oxy IR/ROXICODONE) immediate release tablet 5-10 mg  5-10 mg Oral Q4H PRN Michael Boston, MD   10 mg at 07/14/22 0304   pantoprazole (PROTONIX) EC tablet  40 mg  40 mg Oral BID Rogelia Mire, MD   40 mg at 07/14/22 0843   phenol (CHLORASEPTIC) mouth spray 2 spray  2 spray Mouth/Throat PRN Michael Boston, MD   2 spray at 07/05/22 1037   polycarbophil (FIBERCON) tablet 625 mg  625 mg Oral BID Michael Boston, MD   625 mg at 07/14/22 0843   QUEtiapine (SEROQUEL) tablet 25 mg  25 mg Oral QHS PRN Michael Boston, MD       simethicone (MYLICON) chewable tablet 40 mg  40 mg Oral Q6H PRN Michael Boston, MD   40 mg at 07/12/22 0824   sodium chloride flush (NS) 0.9 % injection 10-40 mL  10-40 mL Intracatheter Gorden Harms, MD   10 mL at 07/13/22 1014   sodium chloride flush (NS) 0.9 % injection 10-40 mL  10-40 mL Intracatheter PRN Michael Boston, MD   10 mL at 07/10/22 1743   TPN ADULT (ION)   Intravenous Continuous TPN Randa Spike, RPH 100 mL/hr at 07/13/22 1823 New Bag at 07/13/22 1823   TPN ADULT (ION)   Intravenous Continuous TPN Emiliano Dyer, [facility]         Discharge Medications: Please see discharge summary for a list of discharge medications.  Relevant Imaging Results:  Relevant Lab Results:   Additional Information SSN: [id number]  Sherie Don, LCSW

## 2022-07-14 NOTE — Assessment & Plan Note (Signed)
Cr stable relative to baseline

## 2022-07-14 NOTE — Assessment & Plan Note (Signed)
Resolved with supplementation and starting spironolactone. 

## 2022-07-14 NOTE — Care Management Important Message (Signed)
Important Message  Patient Details IM Letter given. Name: Thomas [name] MRN: [id number] Date of Birth: 02-10-1956   Medicare Important Message Given:  Yes     Kerin Salen 07/14/2022, 11:57 AM

## 2022-07-14 NOTE — Progress Notes (Signed)
Nutrition Follow-up  DOCUMENTATION CODES:   Non-severe (moderate) malnutrition in context of chronic illness  INTERVENTION:  - TPN to continue at goal of 125m/hr per pharmacy. This provides 130g protein, 360g dextrose, 744 [place] kcals = 2486 kcal - TPN management per pharmacy.    - DYS 3 diet as medically appropriate. - Boost Breeze po TID, each supplement provides 250 kcal and 9 grams of protein  - 48 Calorie count to start today (3/6) and run through dinner tomorrow (3/7).   - Please document % intake of all meals, snacks, and nutrition supplements patient consumes.   - If patient skips/refuses a meal please document 0%.  - Plan discussed with RN.   - Monitor weight trends.   NUTRITION DIAGNOSIS:   Moderate Malnutrition related to chronic illness (transverse colon mass) as evidenced by moderate fat depletion, moderate muscle depletion. *ongoing  GOAL:   Patient will meet greater than or equal to 90% of their needs *met with TPN  MONITOR:   Diet advancement, Labs, Weight trends, PO intake  REASON FOR ASSESSMENT:   Consult New TPN/TNA  ASSESSMENT:   67year old male with history of transverse colon mass s/p transverse colectomy, DM2, who was admitted for an elective robotic ostomy takedown with lysis of adhesions on 07/01/22. Postoperatively noted to have right lower lobe collapse and hypoxemia as well as ileus.  2/22 takedown of end colostomy and anastomosis, lysis of adhesions, and repair of incarcerated incisional hernia 2/23 advanced to DYS 1 diet 2/24 pt vomited, NG placed->LIS 2/28 CL diet 2/29 DYS 1 diet, starting TPN 3/3 Increased to goal TPN 3/5 DYS 3 diet  Patient reports his appetite remains poor and he is also afraid to eat de to previous aspiration event. Has only been consuming jello and Boost Breeze.  Thankfully, he enjoys the BColgate-Palmoliveand drank all 3 yesterday. Sipping on one this morning during visit.  Discussed importance of trying to eat  something to eat/drink every couple hours as tolerated to work on increasing oral intake.  Patient notes he plans to continue to drink Boost Breeze and walk around the unit a few times today to work up an appetite.   Plan to start calorie count today. Plan discussed with RN. Surgery note today indicates patient needs to be eating more than 50% for TPN to start weaning down.    Medications reviewed and include: MVI  Labs reviewed:  Creatinine 1.47 Triglycerides 127 (3/14)    Diet Order:   Diet Order             DIET DYS 3 Room service appropriate? Yes; Fluid consistency: Thin  Diet effective now           Diet - low sodium heart healthy                   EDUCATION NEEDS:  Education needs have been addressed  Skin:  Skin Assessment: Reviewed RN Assessment Skin Integrity Issues:: Incisions Incisions: R Abdomen  Last BM:  3/5  Height:  Ht Readings from Last 1 Encounters:  07/01/22 '5\' 8"'$  (1.727 m)   Weight:  Wt Readings from Last 1 Encounters:  07/13/22 84.2 kg    BMI:  Body mass index is 28.22 kg/m.  Estimated Nutritional Needs:  Kcal:  2400-2600 kcals Protein:  125-140 grams Fluid:  >/= 2.4L    ASamson FredericRD, LDN For contact information, refer to [facility]

## 2022-07-14 NOTE — Assessment & Plan Note (Signed)
Post op had mucous plugging RLL, atelectasis, hypoxia.  Respiratory failure ruled out.    Treated with pulmonary toilet, resolved.

## 2022-07-14 NOTE — TOC Progression Note (Signed)
Transition of Care [facility]) - Progression Note   Patient Details  Name: NACOMA CARAVEO MRN: [id number] Date of Birth: 06-29-1955  Transition of [facility]) CM/SW [place], LCSW Phone Number: 07/14/2022, 3:23 PM  Clinical Narrative: PT evaluation is now recommending SNF. Patient is agreeable to short-term rehab. FL2 done; PASRR confirmed. TOC awaiting patient to be weaned off TPN prior to discharge to SNF.  Expected Discharge Plan: Skilled Nursing Facility Barriers to Discharge: Continued Medical Work up, SNF Pending bed offer, Ship broker  Expected Discharge Plan and Services In-house Referral: NA Discharge Planning Services: CM Consult Post Acute Care Choice: [place] Living arrangements for the past 2 months: Single Family Home              DME Arranged: N/A DME Agency: NA HH Arranged: PT, Nurse's Aide  Social Determinants of Health (SDOH) Interventions SDOH Screenings   Food Insecurity: No Food Insecurity (07/01/2022)  Housing: Low Risk  (07/01/2022)  Transportation Needs: No Transportation Needs (07/01/2022)  Utilities: Not At Risk (07/01/2022)  Depression (PHQ2-9): Low Risk  (04/28/2021)  Tobacco Use: Medium Risk (07/09/2022)   Readmission Risk Interventions    07/07/2022    5:56 PM 02/16/2021   10:08 AM  Readmission Risk Prevention Plan  Transportation Screening Complete Complete  PCP or Specialist Appt within 3-5 Days Complete   HRI or [place] Complete   Social Work Consult for [place] Planning/Counseling Enterprise Not Applicable   Medication Review Press photographer) Complete Complete  PCP or Specialist appointment within 3-5 days of discharge  Complete  HRI or [place]  Complete  SW Recovery Care/Counseling Consult  Complete  Palliative Care Screening  Complete  Skilled Nursing Facility  Complete

## 2022-07-14 NOTE — Assessment & Plan Note (Signed)
-   Supplement

## 2022-07-14 NOTE — Assessment & Plan Note (Signed)
No seizures here - Continue Kppera

## 2022-07-14 NOTE — Assessment & Plan Note (Signed)
BP high normal - Continue amlodipine

## 2022-07-14 NOTE — Assessment & Plan Note (Addendum)
Takedown of end colostomy with anastomosis, intraoperative assessment of vascular perfusion using ICG, lysis of adhesions x 90 minutes, primary repair of incarcerated incisional hernia on 2/22 by Dr. Johney Maine   - Post-op care per General Surgery - TPN per surgery

## 2022-07-14 NOTE — Assessment & Plan Note (Signed)
Resolved

## 2022-07-14 NOTE — Progress Notes (Signed)
Thomas [name] [id number] 09-28-1955  CARE TEAM:  PCP: [name], NP  Outpatient Care Team: Patient Care Team: [name], NP as PCP - Caprice Red, MD (Internal Medicine) Patient, No Pcp Per (General Practice) Michael Boston, MD as Consulting Physician (Colon and Rectal Surgery) Irene Shipper, MD as Consulting Physician (Gastroenterology) Ladell Pier, MD as Consulting Physician (Oncology)  Inpatient Treatment Team: Treatment Team: Attending Provider: Michael Boston, MD; Consulting Physician: Alma Friendly, MD; Charge Nurse: Arminda Resides, RN; Rounding Team: Fatima Blank, MD; Pharmacist: Emiliano Dyer, [facility]   Problem List:   Principal Problem:   History of colorectal cancer Active Problems:   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   History of ischemic left MCA stroke 2015   Type 2 diabetes mellitus with diabetic nephropathy, without long-term current use of insulin ([place])   Hypertension   Diabetic polyneuropathy associated with type 2 diabetes mellitus ([place])   Colon cancer ([place])   Protein-calorie malnutrition, moderate ([place])   Epilepsy ([place])   Hyperlipidemia   Gastroesophageal reflux disease without esophagitis   Delirium   Hypokalemia   History of meningioma of the brain   Incisional hernia, without obstruction or gangrene   CKD (chronic kidney disease) stage 3, GFR 30-59 ml/min (HCC)   Insomnia due to medical condition   Hypophosphatemia   Hiccoughs   13 Days Post-Op  07/01/2022  POST-OPERATIVE DIAGNOSIS:   COLOSTOMY FOR COLON RESECTION INCARCERATED INCISIONAL HERNIA   PROCEDURE:   TAKEDOWN OF END COLOSTOMY WITH ANASTOMOSIS INTRAOPERATIVE ASSESSMENT OF TISSUE VASCULAR PERFUSION USING ICG (indocyanine green) IMMUNOFLUORESCENCE TRANSVERSUS ABDOMINIS PLANE (TAP) BLOCK - BILATERAL LYSIS OF ADHESIONS x90 MINUTES (50% OF CASE) PRIMARY REPAIR OF INCARCERATED INCISIONAL HERNIA    SURGEON:  Adin Hector, MD  OR FINDINGS:    Patient had very dense intra-abdominal adhesions of small bowel to all quadrants of the abdomen as well as colon.  He had a loop of small bowel incarcerated in a periumbilical midline incisional hernia 4 x 4 cm.  Reduced and primarily repaired.   No obvious metastatic disease on visceral parietal peritoneum or liver.   It is an isoperistaltic ileocolonic anastomosis (proximal transverse colon to distal transverse colon) that rests in the epigastric region.    Assessment  Deconditioned but improving  [facility] Stay = 13 days)  Plan:  Floor status  Starting tolerate solid dysphagia 3 diet for more food options.  His appetite is poor but better.  Try to encourage him to eat.  Calorie counts.  If eating more than 50%, can consider trying to wean the TPN down  TPN for some developing malnutrition.  Hopefully can wean off when he eats adequately.  I think he is someone I would want to do calorie counts on first.  Maybe start tomorrow if he has decent p.o. attempts.  Post ileus diarrhea resolved without any evidence of infection.  Continue fiber bowel regimen.  CT scan shows no abscess undrained fluid collection or delayed leak.  Lungs improved.  Stop antibiotics and follow.  Some leukocytosis but no evidence of any active infection and no clinical decline.  ICU delirium with sundowning seems to be resolved.  Medicine help with regimen.  Follow electrolytes closely. He has had issues with hypokalemia & hypophosphatemia.  Stable for now.  Chronic kidney disease but nonoliguric.  Try and keep on the dry side as possible.  Pulmonary status seems to be stable.   Nebulizers as needed.  Medicine following.  Some acute blood loss anemia in setting of chronic anemia of chronic disease and iron deficiency anemia.  Hgb stable.  Not hypotensive.  Follow.  -VTE prophylaxis- SCDs, etc  -mobilize as tolerated to help recovery.  Rather deconditioned  but was able to mobilize in a wheelchair and up to the chair several times yesterday.  Hopefully then she He will be more mobilizing.  Hopefully with Flexi-Seal can come out that will be even better.   Disposition:  The patient is from: Home  Anticipate discharge to:  [facility] (SNF)  Anticipated Date of Discharge is:  March 11,2024    Barriers to discharge:  Pending Clinical improvement (more likely than not)  Patient currently is NOT MEDICALLY STABLE for discharge from the hospital from a surgery standpoint.   I updated the patient's status to the patient and nurse at bedside & ICU RNs just outside room.  Recommendations were made.  Questions were answered.  They expressed understanding & appreciation.    I reviewed nursing notes, last 24 h vitals and pain scores, last 48 h intake and output, last 24 h labs and trends, and last 24 h imaging results.  Reviewed notes with respiratory physical therapies as well as critical care nursing.  I have reviewed this patient's available data, including medical history, events of note, test results, etc as part of my evaluation.  A significant portion of that time was spent in counseling.  Care during the described time interval was provided by me.  This care required moderate level of medical decision making.  07/14/2022    Subjective: (Chief complaint)  [place] to be transferred to floor.  Diarrhea stabilized.  Less nausea.  Trying some dysphagia 3 solid food per speech therapy recommendations.  Nuys any coughing or dysphagia  Objective:  Vital signs:  Vitals:   07/13/22 1915 07/13/22 2037 07/14/22 0126 07/14/22 0500  BP: 137/70 (!) 151/70 134/71 (!) 169/78  Pulse: 91 86 84 86  Resp: '18 18 18 18  '$ Temp: 98.5 F (36.9 C) 98.6 F (37 C) 100 F (37.8 C) 98.8 F (37.1 C)  TempSrc: Oral Oral Oral Oral  SpO2: 99% 99% 100% 93%  Weight:      Height:        Last BM Date : 07/13/22  Intake/Output   Yesterday:  03/05  0701 - 03/06 0700 In: 2899.4 [P.O.:360; I.V.:2539.4] Out: 2800 [Urine:2800] This shift:  No intake/output data recorded.  Bowel function:  Flatus: YES  BM:  YES  Drain: (No drain)   Physical Exam:  General: Pt being but awakens in no acute distress.  Calm & engaging in conversation.  No hiccuping or catching. Eyes: PERRL, normal EOM.  Sclera clear.  No icterus Neuro: CN II-XII intact w/o focal sensory/motor deficits. Lymph: No head/neck/groin lymphadenopathy Psych:  No delerium/psychosis/paranoia.  Oriented x 4.  No confusion or anxiety today. HENT: Normocephalic, Mucus membranes moist.  No thrush Neck: Supple, No tracheal deviation.  No obvious thyromegaly Chest: No pain to chest wall compression.  Good respiratory excursion.  No audible wheezing CV:  Pulses intact.  Regular rhythm.  No major extremity edema MS: Normal AROM mjr joints.  No obvious deformity  Abdomen: [place]  Soft.  Mildy distended.  Nontender .  Right upper quadrant old colostomy wound closed and dry.  No purulence or cellulitis.  No guarding nor evidence of peritonitis.  No incarcerated hernias.  Rectal: Flexi-Seal in place with thin gray/brown effluent Ext:   No deformity.  No  mjr edema.  No cyanosis Skin: No petechiae / purpurea.  No major sores.  Warm and dry    Results:   Cultures: Recent Results (from the past 720 hour(s))  MRSA Next Gen by PCR, Nasal     Status: None   Collection Time: 07/03/22  3:16 PM   Specimen: Nasal Mucosa; Nasal Swab  Result Value Ref Range Status   MRSA by PCR Next Gen NOT DETECTED NOT DETECTED Final    Comment: (NOTE) The GeneXpert MRSA Assay (FDA approved for NASAL specimens only), is one component of a comprehensive MRSA colonization surveillance program. It is not intended to diagnose MRSA infection nor to guide or monitor treatment for MRSA infections. Test performance is not FDA approved in patients less than 55 years old. Performed at [facility], [place] [address] [place], [place] [address]   Culture, blood (Routine X 2) w Reflex to ID Panel     Status: None (Preliminary result)   Collection Time: 07/11/22  9:57 AM   Specimen: BLOOD  Result Value Ref Range Status   Specimen Description   Final    BLOOD BLOOD LEFT ARM Performed at [facility] [address], [place], [place] [address]    Special Requests   Final    BOTTLES DRAWN AEROBIC AND ANAEROBIC Blood Culture adequate volume Performed at [facility] [address] [place], [place] [address]    Culture   Final    NO GROWTH 2 DAYS Performed at [facility] [address], [place], [place] [address]    Report Status PENDING  Incomplete  Culture, blood (Routine X 2) w Reflex to ID Panel     Status: None (Preliminary result)   Collection Time: 07/11/22  9:57 AM   Specimen: BLOOD  Result Value Ref Range Status   Specimen Description   Final    BLOOD BLOOD LEFT HAND Performed at [facility] [address] [place], [place] [address]    Special Requests   Final    BOTTLES DRAWN AEROBIC AND ANAEROBIC Blood Culture adequate volume Performed at [facility] [address] [place], [place] [address]    Culture   Final    NO GROWTH 2 DAYS Performed at [facility] [address] [place], [place] [address]    Report Status PENDING  Incomplete  C Difficile Quick Screen (NO PCR Reflex)     Status: None   Collection Time: 07/12/22  8:24 AM   Specimen: STOOL  Result Value Ref Range Status   C Diff antigen NEGATIVE NEGATIVE Final   C Diff toxin NEGATIVE NEGATIVE Final   C Diff interpretation No C. difficile detected.  Final    Comment: Performed at [facility], [place] [address] [place], [place] [address]  Gastrointestinal Panel by PCR , Stool     Status: None   Collection Time: 07/12/22  8:24 AM   Specimen: STOOL  Result Value Ref Range Status    Campylobacter species NOT DETECTED NOT DETECTED Final   Plesimonas shigelloides NOT DETECTED NOT DETECTED Final   Salmonella species NOT DETECTED NOT DETECTED Final   Yersinia enterocolitica NOT DETECTED NOT DETECTED Final   Vibrio species NOT DETECTED NOT DETECTED Final   Vibrio cholerae NOT DETECTED NOT DETECTED Final   Enteroaggregative E coli (EAEC) NOT DETECTED NOT DETECTED Final   Enteropathogenic E coli (EPEC) NOT DETECTED NOT DETECTED Final   Enterotoxigenic E  coli (ETEC) NOT DETECTED NOT DETECTED Final   Shiga like toxin producing E coli (STEC) NOT DETECTED NOT DETECTED Final   Shigella/Enteroinvasive E coli (EIEC) NOT DETECTED NOT DETECTED Final   Cryptosporidium NOT DETECTED NOT DETECTED Final   Cyclospora cayetanensis NOT DETECTED NOT DETECTED Final   Entamoeba histolytica NOT DETECTED NOT DETECTED Final   Giardia lamblia NOT DETECTED NOT DETECTED Final   Adenovirus F40/41 NOT DETECTED NOT DETECTED Final   Astrovirus NOT DETECTED NOT DETECTED Final   Norovirus GI/GII NOT DETECTED NOT DETECTED Final   Rotavirus A NOT DETECTED NOT DETECTED Final   Sapovirus (I, II, IV, and V) NOT DETECTED NOT DETECTED Final    Comment: Performed at [facility], [address] [place], [place] [address]    Labs: Results for orders placed or performed during the hospital encounter of 07/01/22 (from the past 48 hour(s))  C Difficile Quick Screen (NO PCR Reflex)     Status: None   Collection Time: 07/12/22  8:24 AM   Specimen: STOOL  Result Value Ref Range   C Diff antigen NEGATIVE NEGATIVE   C Diff toxin NEGATIVE NEGATIVE   C Diff interpretation No C. difficile detected.     Comment: Performed at [facility], [place] [address], [place], [place] [address]  Gastrointestinal Panel by PCR , Stool     Status: None   Collection Time: 07/12/22  8:24 AM   Specimen: STOOL  Result Value Ref Range   Campylobacter species NOT DETECTED NOT DETECTED   Plesimonas  shigelloides NOT DETECTED NOT DETECTED   Salmonella species NOT DETECTED NOT DETECTED   Yersinia enterocolitica NOT DETECTED NOT DETECTED   Vibrio species NOT DETECTED NOT DETECTED   Vibrio cholerae NOT DETECTED NOT DETECTED   Enteroaggregative E coli (EAEC) NOT DETECTED NOT DETECTED   Enteropathogenic E coli (EPEC) NOT DETECTED NOT DETECTED   Enterotoxigenic E coli (ETEC) NOT DETECTED NOT DETECTED   Shiga like toxin producing E coli (STEC) NOT DETECTED NOT DETECTED   Shigella/Enteroinvasive E coli (EIEC) NOT DETECTED NOT DETECTED   Cryptosporidium NOT DETECTED NOT DETECTED   Cyclospora cayetanensis NOT DETECTED NOT DETECTED   Entamoeba histolytica NOT DETECTED NOT DETECTED   Giardia lamblia NOT DETECTED NOT DETECTED   Adenovirus F40/41 NOT DETECTED NOT DETECTED   Astrovirus NOT DETECTED NOT DETECTED   Norovirus GI/GII NOT DETECTED NOT DETECTED   Rotavirus A NOT DETECTED NOT DETECTED   Sapovirus (I, II, IV, and V) NOT DETECTED NOT DETECTED    Comment: Performed at [facility], [address] [place], [place] [address]  Glucose, capillary     Status: Abnormal   Collection Time: 07/12/22 12:09 PM  Result Value Ref Range   Glucose-Capillary 151 (H) 70 - 99 mg/dL    Comment: Glucose reference range applies only to samples taken after fasting for at least 8 hours.   Comment 1 Notify RN    Comment 2 Document in Chart   Glucose, capillary     Status: Abnormal   Collection Time: 07/12/22  4:31 PM  Result Value Ref Range   Glucose-Capillary 125 (H) 70 - 99 mg/dL    Comment: Glucose reference range applies only to samples taken after fasting for at least 8 hours.  Glucose, capillary     Status: Abnormal   Collection Time: 07/12/22  5:28 PM  Result Value Ref Range   Glucose-Capillary 138 (H) 70 - 99 mg/dL    Comment: Glucose reference range applies only  to samples taken after fasting for at least 8 hours.   Comment 1 Notify RN    Comment 2 Document in Chart   Glucose,  capillary     Status: Abnormal   Collection Time: 07/13/22 12:03 AM  Result Value Ref Range   Glucose-Capillary 145 (H) 70 - 99 mg/dL    Comment: Glucose reference range applies only to samples taken after fasting for at least 8 hours.  CBC with Differential/Platelet     Status: Abnormal   Collection Time: 07/13/22  5:10 AM  Result Value Ref Range   WBC 20.6 (H) 4.0 - 10.5 K/uL   RBC 3.32 (L) 4.22 - 5.81 MIL/uL   Hemoglobin 8.7 (L) 13.0 - 17.0 g/dL    Comment: Reticulocyte Hemoglobin testing may be clinically indicated, consider ordering this additional test [id number]    HCT 25.5 (L) 39.0 - 52.0 %   MCV 76.8 (L) 80.0 - 100.0 fL   MCH 26.2 26.0 - 34.0 pg   MCHC 34.1 30.0 - 36.0 g/dL   RDW 15.4 11.5 - 15.5 %   Platelets 471 (H) 150 - 400 K/uL   nRBC 0.0 0.0 - 0.2 %   Neutrophils Relative % 77 %   Neutro Abs 16.0 (H) 1.7 - 7.7 K/uL   Lymphocytes Relative 8 %   Lymphs Abs 1.7 0.7 - 4.0 K/uL   Monocytes Relative 9 %   Monocytes Absolute 1.9 (H) 0.1 - 1.0 K/uL   Eosinophils Relative 3 %   Eosinophils Absolute 0.6 (H) 0.0 - 0.5 K/uL   Basophils Relative 1 %   Basophils Absolute 0.1 0.0 - 0.1 K/uL   Immature Granulocytes 2 %   Abs Immature Granulocytes 0.31 (H) 0.00 - 0.07 K/uL    Comment: Performed at [facility], [place] [address], [place], [place] [address]  Comprehensive metabolic panel     Status: Abnormal   Collection Time: 07/13/22  5:10 AM  Result Value Ref Range   Sodium 139 135 - 145 mmol/L   Potassium 4.3 3.5 - 5.1 mmol/L   Chloride 113 (H) 98 - 111 mmol/L   CO2 21 (L) 22 - 32 mmol/L   Glucose, Bld 138 (H) 70 - 99 mg/dL    Comment: Glucose reference range applies only to samples taken after fasting for at least 8 hours.   BUN 38 (H) 8 - 23 mg/dL   Creatinine, Ser 1.40 (H) 0.61 - 1.24 mg/dL   Calcium 8.3 (L) 8.9 - 10.3 mg/dL   Total Protein 7.0 6.5 - 8.1 g/dL   Albumin 2.6 (L) 3.5 - 5.0 g/dL   AST 53 (H) 15 - 41 U/L   ALT 82 (H) 0 - 44 U/L    Alkaline Phosphatase 67 38 - 126 U/L   Total Bilirubin 0.4 0.3 - 1.2 mg/dL   GFR, Estimated 56 (L) >60 mL/min    Comment: (NOTE) Calculated using the CKD-EPI Creatinine Equation (2021)    Anion gap 5 5 - 15    Comment: Performed at [facility], [place] [address], [place],  [address]  Glucose, capillary     Status: Abnormal   Collection Time: 07/13/22  5:16 AM  Result Value Ref Range   Glucose-Capillary 144 (H) 70 - 99 mg/dL    Comment: Glucose reference range applies only to samples taken after fasting for at least 8 hours.  Glucose, capillary     Status: Abnormal   Collection Time: 07/13/22 11:43 AM  Result Value Ref  Range   Glucose-Capillary 145 (H) 70 - 99 mg/dL    Comment: Glucose reference range applies only to samples taken after fasting for at least 8 hours.   Comment 1 Notify RN    Comment 2 Document in Chart   Glucose, capillary     Status: Abnormal   Collection Time: 07/13/22  5:27 PM  Result Value Ref Range   Glucose-Capillary 142 (H) 70 - 99 mg/dL    Comment: Glucose reference range applies only to samples taken after fasting for at least 8 hours.   Comment 1 Notify RN    Comment 2 Document in Chart   Glucose, capillary     Status: Abnormal   Collection Time: 07/13/22 11:42 PM  Result Value Ref Range   Glucose-Capillary 140 (H) 70 - 99 mg/dL    Comment: Glucose reference range applies only to samples taken after fasting for at least 8 hours.  Glucose, capillary     Status: Abnormal   Collection Time: 07/14/22  5:54 AM  Result Value Ref Range   Glucose-Capillary 146 (H) 70 - 99 mg/dL    Comment: Glucose reference range applies only to samples taken after fasting for at least 8 hours.    Imaging / Studies: No results found.  Medications / Allergies: per chart  Antibiotics: Anti-infectives (From admission, onward)    Start     Dose/Rate Route Frequency Ordered Stop   07/10/22 0400  piperacillin-tazobactam (ZOSYN) IVPB 3.375 g  Status:   Discontinued        3.375 g 12.5 mL/hr over 240 Minutes Intravenous Every 8 hours 07/09/22 2040 07/10/22 0738   07/08/22 1100  piperacillin-tazobactam (ZOSYN) IVPB 3.375 g  Status:  Discontinued        3.375 g 12.5 mL/hr over 240 Minutes Intravenous Every 8 hours 07/08/22 1001 07/09/22 2040   07/08/22 1000  erythromycin 250 mg in sodium chloride 0.9 % 100 mL IVPB  Status:  Discontinued        250 mg 100 mL/hr over 60 Minutes Intravenous Every 12 hours 07/08/22 0739 07/09/22 0730   07/06/22 0915  erythromycin 250 mg in sodium chloride 0.9 % 100 mL IVPB  Status:  Discontinued        250 mg 100 mL/hr over 60 Minutes Intravenous Every 8 hours 07/06/22 0823 07/08/22 0739   07/03/22 1700  piperacillin-tazobactam (ZOSYN) IVPB 3.375 g        3.375 g 12.5 mL/hr over 240 Minutes Intravenous Every 8 hours 07/03/22 1612 07/08/22 0450   07/02/22 0000  cefoTEtan (CEFOTAN) 2 g in sodium chloride 0.9 % 100 mL IVPB        2 g 200 mL/hr over 30 Minutes Intravenous Every 12 hours 07/01/22 1737 07/02/22 0051   07/01/22 1400  neomycin (MYCIFRADIN) tablet 1,000 mg  Status:  Discontinued       See Hyperspace for full Linked Orders Report.   1,000 mg Oral 3 times per day 07/01/22 1054 07/01/22 1109   07/01/22 1400  metroNIDAZOLE (FLAGYL) tablet 1,000 mg  Status:  Discontinued       See Hyperspace for full Linked Orders Report.   1,000 mg Oral 3 times per day 07/01/22 1054 07/01/22 1109   07/01/22 1100  cefoTEtan (CEFOTAN) 2 g in sodium chloride 0.9 % 100 mL IVPB        2 g 200 mL/hr over 30 Minutes Intravenous On call to O.R. 07/01/22 1054 07/01/22 1204         Note: Portions  of this report may have been transcribed using voice recognition software. Every effort was made to ensure accuracy; however, inadvertent computerized transcription errors may be present.   Any transcriptional errors that result from this process are unintentional.    Adin Hector, MD, FACS, MASCRS Esophageal, Gastrointestinal  & Colorectal Surgery Robotic and Minimally Invasive Surgery  [facility]. [address] [place], [place] [address] [phone] Fax [phone] Main  CONTACT INFORMATION:  Weekday (9AM-5PM): Call [facility] main office at [phone]  Weeknight (5PM-9AM) or Weekend/Holiday: Check [email or web address] (password " [email or web address]") for General Surgery [facility] coverage  (Please, do not use SecureChat as it is not reliable communication to reach operating surgeons for immediate patient care given surgeries/outpatient duties/clinic/cross-coverage/off post-call which would lead to a delay in care.  Epic staff messaging available for outptient concerns, but may not be answered for 48 hours or more).     07/14/2022  7:11 AM

## 2022-07-14 NOTE — Assessment & Plan Note (Signed)
Glucoses well controlled - Continue SS corrections

## 2022-07-14 NOTE — Assessment & Plan Note (Signed)
Not on aspirin - Continue Crestor

## 2022-07-14 NOTE — Assessment & Plan Note (Signed)
-   TPN per Surgery

## 2022-07-14 NOTE — Progress Notes (Signed)
PHARMACY - TOTAL PARENTERAL NUTRITION CONSULT NOTE   Indication: Prolonged ileus  Patient Measurements: Height: '5\' 8"'$  (172.7 cm) Weight: 84.2 kg (185 lb 10 oz) IBW/kg (Calculated) : 68.4 TPN AdjBW (KG): 73.1 Body mass index is 28.22 kg/m. Usual Weight:   Assessment: 67 yo male with hx colon cancer diagnosed 2022 s/p transverse colectomy admitted for elective robotic ostomy takedown with lysis of adhesions, incarcerated incisional hernia repair on 07/01/22. Post-op course complicated by right lower lobe collapse and hypoxemia, transferred to stepdown on 07/03/22 for management.  Pharmacy consulted for TPN on 07/08/22 for prolonged ileus.  Glucose / Insulin: Hx DM2 A1c 5.1 (07/01/22) - CBG range 140-146, goal 100-180 - 8 units SSI required Electrolytes: WNL, including CorrCa (9.02). K at upper end of normal (4.5) Renal: CKDIII. SCr ~baseline, up 1.47 today. BUN elevated at 42 Hepatic: AST/ALT 75/105, elevated and rising.  TG, Alk phos, T.bili WNL. Alb low Intake / Output: -UOP: 2800 mL, stool: significantly decreasing on fiber BID.  NG tube out -I/O Net: +99 ml MIVF: NS @ K[facility] GI Imaging: - 2/27 AXR: Previous postoperative pneumoperitoneum is less distinct and may have resolved or at least partially resolved. -2/29 Abdominal: Moderate dilation of small bowel loops, suggesting ileus. Linear collection of air adjacent to the right hemidiaphragm. Possibility of pneumoperitoneum is not excluded.  -3/1 CT Abd: No evidence of leak or abscess GI Surgeries / Procedures:  - 2/22: robotic colostomy takedown and lysis of adhesions  Central access: implanted port. Double lumen PICC placed 3/2 TPN start date: 2/29  Nutritional Goals: Goal TPN rate is 100 mL/hr (provides 130 g of protein, 360 g dextrose, and 2486 kcals per day)  RD Assessment: pending Estimated Needs Total Energy Estimated Needs: 2400-2600 kcals Total Protein Estimated Needs: 125-140 grams Total Fluid Estimated Needs: >/=  2.4L  Current Nutrition:  Diet advanced to DYS3 - Calorie count ordered per Dr. Johney Maine Boost/Breeze TID per RD, sipping on some per RN TPN  Plan:  At 1800: Continue TPN at goal rate, 100 mL/hr at 1800  Electrolytes in TPN:  Na 17mq/L,  K 15 mEq/L (decrease),  Ca 03m/L,  Mg 44m45mL,  Phos 68m49mL.  Cl:Ac max Ac Continue enteral MVI as he is tolerating all PO meds.  Continue mSSI q6h [facility] Monitor TPN labs on Mon/Thurs   ErinPeggyann JubaarmD, BCPS [facility] main pharmacy [phone] 8:47 AM

## 2022-07-14 NOTE — Hospital Course (Addendum)
Thomas [name] is a 67 y.o. M with DM, HTN, colon CA, hx stroke, seizures, meningioma who presented for electrive ostomy takedown.  Post-operative course complicated by delirium and hypoxia from atelectasis/mucus plugging/aspiration, medicine consulted.

## 2022-07-14 NOTE — Assessment & Plan Note (Signed)
Continue Crestor 

## 2022-07-14 NOTE — Assessment & Plan Note (Signed)
Hgb stable 

## 2022-07-14 NOTE — Progress Notes (Signed)
  Progress Note   Patient: Thomas [name] [id number] DOB: 1955-11-03 DOA: 07/01/2022     13 DOS: the patient was seen and examined on 07/14/2022 at 9:15AM      Brief hospital course: Thomas [name] is a 67 y.o. M with DM, HTN, colon CA, hx stroke, seizures, meningioma who presented for electrive ostomy takedown.  Post-operative course complicated by delirium and hypoxia from atelectasis/mucus plugging/aspiration, medicine consulted.        Assessment and Plan: * Colon cancer ([place])    Incisional hernia, without obstruction or gangrene Takedown of end colostomy with anastomosis, intraoperative assessment of vascular perfusion using ICG, lysis of adhesions x 90 minutes, primary repair of incarcerated incisional hernia on 2/22 by Dr. 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  Respiratory failure ruled out.    Treated with pulmonary toilet, resolved.  Normocytic anemia Hgb stable  Hypophosphatemia - Supplement   CKD (chronic kidney disease) stage 3b Cr stable relative to baseline  Hypokalemia - Supplement K  Delirium Resolved  Hyperlipidemia - Continue Crestor  Epilepsy ([place]) No seizures here - Continue Kppera  Protein-calorie malnutrition, moderate (HCC) - TPN per Surgery  History of ischemic left MCA stroke 2015 Not on aspirin - Continue Crestor  Hypertension BP high normal - Continue amlodipine  Type 2 diabetes mellitus with diabetic nephropathy, without long-term current use of insulin (HCC) Glucoses well controlled - Continue SS corrections          Subjective: No complaints, no nursing concerns, no confusion, no vomiting, no respiratory symptoms.     Physical Exam: BP (!) 140/65 (BP Location: Left Arm)   Pulse 91   Temp 99.3 F (37.4 C) (Oral)   Resp 17   Ht '5\' 8"'$   (1.727 m)   Wt 84.2 kg   SpO2 98%   BMI 28.22 kg/m   Adult male, sitting in bed, interactive and appropriate RRR, systolic murmur, no peripheral edema Respiratory rate normal, lungs clear without rales or wheezes He has some voluntary guarding of the abdomen, but overall it is soft and he has no tenderness or rigidity Attention normal, affect appropriate, judgment and insight appear normal    Data Reviewed: Comprehensive metabolic panel shows mild transaminitis, stable renal function, normal sodium and potassium    Family Communication: None present    Disposition: Status is: Inpatient From a medical standpoint, things appear stable, and I suspect the patient could be discharged whenever deemed ready by general surgery`(and they are able to wean off TPN)        Author: Edwin Dada, MD 07/14/2022 4:02 PM  For on call review [email or web address].

## 2022-07-15 DIAGNOSIS — D649 Anemia, unspecified: Secondary | ICD-10-CM | POA: Diagnosis not present

## 2022-07-15 DIAGNOSIS — G40909 Epilepsy, unspecified, not intractable, without status epilepticus: Secondary | ICD-10-CM | POA: Diagnosis not present

## 2022-07-15 DIAGNOSIS — C184 Malignant neoplasm of transverse colon: Secondary | ICD-10-CM | POA: Diagnosis not present

## 2022-07-15 DIAGNOSIS — N1832 Chronic kidney disease, stage 3b: Secondary | ICD-10-CM | POA: Diagnosis not present

## 2022-07-15 LAB — COMPREHENSIVE METABOLIC PANEL
ALT: 112 U/L — ABNORMAL HIGH (ref 0–44)
AST: 70 U/L — ABNORMAL HIGH (ref 15–41)
Albumin: 2.4 g/dL — ABNORMAL LOW (ref 3.5–5.0)
Alkaline Phosphatase: 87 U/L (ref 38–126)
Anion gap: 5 (ref 5–15)
BUN: 41 mg/dL — ABNORMAL HIGH (ref 8–23)
CO2: 24 mmol/L (ref 22–32)
Calcium: 7.8 mg/dL — ABNORMAL LOW (ref 8.9–10.3)
Chloride: 105 mmol/L (ref 98–111)
Creatinine, Ser: 1.55 mg/dL — ABNORMAL HIGH (ref 0.61–1.24)
GFR, Estimated: 49 mL/min — ABNORMAL LOW (ref >60)
Glucose, Bld: 159 mg/dL — ABNORMAL HIGH (ref 70–99)
Potassium: 4.3 mmol/L (ref 3.5–5.1)
Sodium: 134 mmol/L — ABNORMAL LOW (ref 135–145)
Total Bilirubin: 0.5 mg/dL (ref 0.3–1.2)
Total Protein: 7.1 g/dL (ref 6.5–8.1)

## 2022-07-15 LAB — GLUCOSE, CAPILLARY
Glucose-Capillary: 141 mg/dL — ABNORMAL HIGH (ref 70–99)
Glucose-Capillary: 150 mg/dL — ABNORMAL HIGH (ref 70–99)
Glucose-Capillary: 152 mg/dL — ABNORMAL HIGH (ref 70–99)
Glucose-Capillary: 156 mg/dL — ABNORMAL HIGH (ref 70–99)
Glucose-Capillary: 169 mg/dL — ABNORMAL HIGH (ref 70–99)
Glucose-Capillary: 180 mg/dL — ABNORMAL HIGH (ref 70–99)

## 2022-07-15 LAB — CBC WITH DIFFERENTIAL/PLATELET
Abs Immature Granulocytes: 0.18 10*3/uL — ABNORMAL HIGH (ref 0.00–0.07)
Basophils Absolute: 0.1 10*3/uL (ref 0.0–0.1)
Basophils Relative: 1 %
Eosinophils Absolute: 0.3 10*3/uL (ref 0.0–0.5)
Eosinophils Relative: 2 %
HCT: 23.7 % — ABNORMAL LOW (ref 39.0–52.0)
Hemoglobin: 8 g/dL — ABNORMAL LOW (ref 13.0–17.0)
Immature Granulocytes: 1 %
Lymphocytes Relative: 8 %
Lymphs Abs: 1.4 10*3/uL (ref 0.7–4.0)
MCH: 25.9 pg — ABNORMAL LOW (ref 26.0–34.0)
MCHC: 33.8 g/dL (ref 30.0–36.0)
MCV: 76.7 fL — ABNORMAL LOW (ref 80.0–100.0)
Monocytes Absolute: 1.9 10*3/uL — ABNORMAL HIGH (ref 0.1–1.0)
Monocytes Relative: 11 %
Neutro Abs: 14.3 10*3/uL — ABNORMAL HIGH (ref 1.7–7.7)
Neutrophils Relative %: 77 %
Platelets: 550 10*3/uL — ABNORMAL HIGH (ref 150–400)
RBC: 3.09 MIL/uL — ABNORMAL LOW (ref 4.22–5.81)
RDW: 15.5 % (ref 11.5–15.5)
WBC: 18.2 10*3/uL — ABNORMAL HIGH (ref 4.0–10.5)
nRBC: 0 % (ref 0.0–0.2)

## 2022-07-15 LAB — PREALBUMIN: Prealbumin: 23 mg/dL (ref 18–38)

## 2022-07-15 LAB — PHOSPHORUS: Phosphorus: 4.2 mg/dL (ref 2.5–4.6)

## 2022-07-15 LAB — TRIGLYCERIDES: Triglycerides: 87 mg/dL (ref <150)

## 2022-07-15 LAB — MAGNESIUM: Magnesium: 1.8 mg/dL (ref 1.7–2.4)

## 2022-07-15 MED ORDER — INSULIN ASPART 100 UNIT/ML IJ SOLN
0.0000 [IU] | Freq: Three times a day (TID) | INTRAMUSCULAR | Status: DC
  Administered 2022-07-15: 3 [IU] via SUBCUTANEOUS
  Administered 2022-07-15: 2 [IU] via SUBCUTANEOUS
  Administered 2022-07-15: 3 [IU] via SUBCUTANEOUS
  Administered 2022-07-16: 2 [IU] via SUBCUTANEOUS

## 2022-07-15 MED ORDER — TRAVASOL 10 % IV SOLN
INTRAVENOUS | Status: AC
  Filled 2022-07-15: qty 648

## 2022-07-15 MED ORDER — INSULIN ASPART 100 UNIT/ML IJ SOLN: 0.0000 [IU] | Freq: Every day | INTRAMUSCULAR | Status: DC

## 2022-07-15 MED ORDER — MAGNESIUM SULFATE 2 GM/50ML IV SOLN
2.0000 g | Freq: Once | INTRAVENOUS | Status: AC
  Administered 2022-07-15: 2 g via INTRAVENOUS
  Filled 2022-07-15: qty 50

## 2022-07-15 MED ORDER — TRAVASOL 10 % IV SOLN: INTRAVENOUS | Status: DC

## 2022-07-15 NOTE — TOC Progression Note (Signed)
Transition of Care [facility]) - Progression Note   Patient Details  Name: Thomas [name] MRN: [id number] Date of Birth: [birth year]  Transition [facility]) CM/SW [place], LCSW Phone Number: 07/15/2022, 10:19 AM  Clinical Narrative: Initial referral for SNF faxed out to facilities in hub. TOC awaiting bed offers.    Expected Discharge Plan: Skilled Nursing Facility Barriers to Discharge: Continued Medical Work up, SNF Pending bed offer, Ship broker  Expected Discharge Plan and Services In-house Referral: NA Discharge Planning Services: CM Consult Post Acute Care Choice: [place] Living arrangements for the past 2 months: Single Family Home             DME Arranged: N/A DME Agency: NA HH Arranged: PT, Nurse's Aide  Social Determinants of Health (SDOH) Interventions SDOH Screenings   Food Insecurity: No Food Insecurity (07/01/2022)  Housing: Low Risk  (07/01/2022)  Transportation Needs: No Transportation Needs (07/01/2022)  Utilities: Not At Risk (07/01/2022)  Depression (PHQ2-9): Low Risk  (04/28/2021)  Tobacco Use: Medium Risk (07/09/2022)   Readmission Risk Interventions    07/07/2022    5:56 PM 02/16/2021   10:08 AM  Readmission Risk Prevention Plan  Transportation Screening Complete Complete  PCP or Specialist Appt within 3-5 Days Complete   HRI or [place] Complete   Social Work Consult for [place] Planning/Counseling [place] Not Applicable   Medication Review Press photographer) Complete Complete  PCP or Specialist appointment within 3-5 days of discharge  Complete  HRI or [place]  Complete  SW Recovery Care/Counseling Consult  Complete  Palliative Care Screening  Complete  Skilled Nursing Facility  Complete

## 2022-07-15 NOTE — Progress Notes (Signed)
Speech Language Pathology Treatment: Dysphagia  Patient Details Name: Thomas [name] MRN: [id number] DOB: 26-Apr-1956 Today's Date: 07/15/2022 Time: [id number] SLP Time Calculation (min) (ACUTE ONLY): 10 min  Assessment / Plan / Recommendation Clinical Impression  SLP discussed with pt RNs report of him declining to eat due to being worried he would "choke". Pt today reports he has "regurgitation" and then will have choking. Inquired if combined with frothy secretions - to which he confirmed. Pt does not have oropharyngeal deficits based on interview - articulation is clear and symptoms are not oropharyngeal based. Pt politely declined to consume any intake. Frequent erucation vs hiccups noted during interview which caused him severe pain - as he moaned out loud- He stated he needd to sleep for it to abate and he may wake refreshed. Pt is invested in eating and getting better stating he just "needs to relax to get in proper mindset". Advised he try to consume small frequent meals- staying upright afterward. Encouraged use of his IS and flutter valve BEFORE meals. No SLP follow up needed.     HPI HPI: Patient is a 67 y.o. male with PMH: CVA, frontal meningioma s/p lobectomy, DVT, a-fib, HTN, DM, colon and rectal cancer, colostomy, VDRF with trach 2022, transverse colon cancer, stage IIIb (T3N1a), status post a partial transverse colectomy and end colostomy 09/10/2020, CT abdomen/pelvis 09/04/2020- possible transverse colon mass with small regional lymph nodes, CT chest 09/05/2020- obstructing transverse colon mass-biopsy invasive adenocarcinoma, mass could not be passed CT chest 09/09/2020-no evidence of metastatic disease, 3 x 2 mm subpleural nodule in the right upper lobe likely benign subpleural lymph node. He presented to the hospital on 07/01/22.  Underwent exploratory laparotomy, transverse colectomy and end colostomy 09/10/2020. He presented to the hospital on 07/01/22, S/P LYSIS OF ADHESIONS ,  PRIMARY REPAIR OF  INCARCERATED INCISIONAL HERNIA , TAKEDOWN OF END COLOSTOMY WITH ANASTOMOSIS.2/24 moved to stepdown unit for hypoxemia, right lower lobe collapse CT chest showed debris in right lower lobe with atelectasis. SLP swallow evaluation ordered on 07/04/22 but  unable to be completed due to GI ussues. MD initiated PO diet of Dys 1 (puree), thin liquids on 07/11/22 reordered SLP on 07/12/22 secondary topostop ileus with vomiting and probably aspiration.  Pt has been consuming minimal po - RN reports he tells her he is worried that he may "choke", thus she requested SLP see pt today.      SLP Plan  All goals met      Recommendations for follow up therapy are one component of a multi-disciplinary discharge planning process, led by the attending physician.  Recommendations may be updated based on patient status, additional functional criteria and insurance authorization.    Recommendations  Diet recommendations: Regular;Thin liquid Liquids provided via: Cup Medication Administration: Whole meds with liquid Compensations: Slow rate;Small sips/bites;Follow solids with liquid Postural Changes and/or Swallow Maneuvers: Seated upright 90 degrees;Out of bed for meals                Oral Care Recommendations: Oral care BID Follow Up Recommendations: No SLP follow up Assistance recommended at discharge: None SLP Visit Diagnosis: Dysphagia, unspecified (R13.10) Plan: All goals met         [name], MS [facility] SLP Acute Rehab Services Office [phone]   [name]  07/15/2022, 3:58 PM

## 2022-07-15 NOTE — Progress Notes (Signed)
MONTERIOUS DELISI [id number] Aug 27, 1955  CARE TEAM:  PCP: [name], NP  Outpatient Care Team: Patient Care Team: [name], NP as PCP - General Benito Mccreedy, MD (Internal Medicine) Patient, No Pcp Per (General Practice) Michael Boston, MD as Consulting Physician (Colon and Rectal Surgery) Irene Shipper, MD as Consulting Physician (Gastroenterology) Ladell Pier, MD as Consulting Physician (Oncology)  Inpatient Treatment Team: Treatment Team: Attending Provider: Michael Boston, MD; Consulting Physician: Alma Friendly, MD; Rounding Team: Fatima Blank, MD; Speech Language Pathologist: Narda Rutherford, [place]; Pharmacist: Emiliano Dyer, [facility]; Respiratory Therapist: Christella Noa, RRT; Physical Therapy Assistant: Lilia Argue; Charge Nurse: Steward Ros, RN   Problem List:   Principal Problem:   Colon cancer [facility]) Active Problems:   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   History of ischemic left MCA stroke 2015   Type 2 diabetes mellitus with diabetic nephropathy, without long-term current use of insulin ([place])   Hypertension   Protein-calorie malnutrition, moderate ([place])   Epilepsy ([place])   Hyperlipidemia   Delirium   Hypokalemia   Incisional hernia, without obstruction or gangrene   CKD (chronic kidney disease) stage 3b   Hypophosphatemia   Normocytic anemia   Atelectasis   14 Days Post-Op  07/01/2022  POST-OPERATIVE DIAGNOSIS:   COLOSTOMY FOR COLON RESECTION INCARCERATED INCISIONAL HERNIA   PROCEDURE:   TAKEDOWN OF END COLOSTOMY WITH ANASTOMOSIS INTRAOPERATIVE ASSESSMENT OF TISSUE VASCULAR PERFUSION USING ICG (indocyanine green) IMMUNOFLUORESCENCE TRANSVERSUS ABDOMINIS PLANE (TAP) BLOCK - BILATERAL LYSIS OF ADHESIONS x90 MINUTES (50% OF CASE) PRIMARY REPAIR OF INCARCERATED INCISIONAL HERNIA   SURGEON:  Adin Hector, MD  OR FINDINGS:    Patient had very dense intra-abdominal  adhesions of small bowel to all quadrants of the abdomen as well as colon.  He had a loop of small bowel incarcerated in a periumbilical midline incisional hernia 4 x 4 cm.  Reduced and primarily repaired.   No obvious metastatic disease on visceral parietal peritoneum or liver.   It is an isoperistaltic ileocolonic anastomosis (proximal transverse colon to distal transverse colon) that rests in the epigastric region.    Assessment  Deconditioned but improving  [facility] Stay = 14 days)  Plan:  Floor status without decompensation.  Tolerating dysphagia 3 diet.  No dysphagia or coughing or difficulty.  Will try regular diet for better options.  He likes that idea.  I do not think he is eating great, but we will try weaning his TPN to 50% today and see if we can wean him off in the next day or so if possible.    TPN for some developing malnutrition.  Hopefully can wean off when he eats adequately.  I think he is someone I would want to do calorie counts on first.  Maybe start tomorrow if he has decent p.o. attempts.  Post ileus diarrhea resolved without any evidence of infection.  Continue fiber bowel regimen.  CT scan shows no abscess undrained fluid collection or delayed leak.  Lungs improved.  Stop antibiotics and follow.  Some leukocytosis but no evidence of any active infection and no clinical decline.  ICU delirium with sundowning seems to be resolved.  Medicine help with regimen.  Follow electrolytes closely. He has had issues with hypokalemia & hypophosphatemia.  Stable for now.  Chronic kidney disease but nonoliguric.  Try and keep on the dry side as possible.  Pulmonary status seems to be stable.   Nebulizers as needed.  Medicine following.  Some acute blood loss anemia in setting of chronic anemia of chronic disease and iron deficiency anemia.  Hgb stable.  Not hypotensive.  Follow.  -VTE prophylaxis- SCDs, etc  -mobilize as tolerated to help recovery.  Rather  deconditioned but was able to mobilize in a with nursing and therapy.  Starting to walk in the hallways more regularly.  Guardedly hopeful sign.  I agree he would benefit from rehab at a skilled facility with more aggressive therapies.  Plans to go to skilled nursing facility.  Hopefully if he has adequate oral intake and is doing better could leave may be as soon as tomorrow.  We will see.     Disposition:  The patient is from: Home  Anticipate discharge to:  [facility] (SNF)  Anticipated Date of Discharge is:  March 8,2024    Barriers to discharge:  Pending Clinical improvement (more likely than not)  Patient currently is NOT MEDICALLY STABLE for discharge from the hospital from a surgery standpoint.   I updated the patient's status to the patient and nurse at bedside & ICU RNs just outside room.  Recommendations were made.  Questions were answered.  They expressed understanding & appreciation.    I reviewed nursing notes, last 24 h vitals and pain scores, last 48 h intake and output, last 24 h labs and trends, and last 24 h imaging results.  Reviewed notes with respiratory physical therapies as well as critical care nursing.  I have reviewed this patient's available data, including medical history, events of note, test results, etc as part of my evaluation.  A significant portion of that time was spent in counseling.  Care during the described time interval was provided by me.  This care required moderate level of medical decision making.  07/15/2022    Subjective: (Chief complaint)  No events.  Tolerating dysphagia 3 diet without coughing or guarding.  Appetite not great but no more hiccuping or nausea.  Walked in hallways.  Objective:  Vital signs:  Vitals:   07/14/22 0927 07/14/22 1137 07/14/22 2112 07/15/22 0636  BP: (!) 151/70 (!) 140/65 126/64 (!) 140/71  Pulse: 92 91 82 87  Resp: '17 17 17 18  '$ Temp: 98.7 F (37.1 C) 99.3 F (37.4 C) 98.9 F (37.2 C)  98.6 F (37 C)  TempSrc: Oral Oral Oral Oral  SpO2: 99% 98% 97% 96%  Weight:      Height:        Last BM Date : 07/14/22  Intake/Output   Yesterday:  03/06 0701 - 03/07 0700 In: 1791.3 [P.O.:540; I.V.:1251.3] Out: 1900 [Urine:1900] This shift:  No intake/output data recorded.  Bowel function:  Flatus: YES  BM:  YES  Drain: (No drain)   Physical Exam:  General: Pt awake and alert.  no acute distress.  Calm & engaging in conversation.  No hiccuping or catching. Eyes: PERRL, normal EOM.  Sclera clear.  No icterus Neuro: CN II-XII intact w/o focal sensory/motor deficits. Lymph: No head/neck/groin lymphadenopathy Psych:  No delerium/psychosis/paranoia.  Oriented x 4.  No confusion or anxiety today. HENT: Normocephalic, Mucus membranes moist.  No thrush Neck: Supple, No tracheal deviation.  No obvious thyromegaly Chest: No pain to chest wall compression.  Good respiratory excursion.  No audible wheezing CV:  Pulses intact.  Regular rhythm.  No major extremity edema MS: Normal AROM mjr joints.  No obvious deformity  Abdomen: Obese but  Soft.  Nondistended.  Nontender .  Right upper quadrant old  colostomy wound closed and dry.  No purulence or cellulitis.  No guarding nor evidence of peritonitis.  No incarcerated hernias.  Rectal: Flexi-Seal in place with thin gray/brown effluent Ext:   No deformity.  No mjr edema.  No cyanosis Skin: No petechiae / purpurea.  No major sores.  Warm and dry    Results:   Cultures: Recent Results (from the past 720 hour(s))  MRSA Next Gen by PCR, Nasal     Status: None   Collection Time: 07/03/22  3:16 PM   Specimen: Nasal Mucosa; Nasal Swab  Result Value Ref Range Status   MRSA by PCR Next Gen NOT DETECTED NOT DETECTED Final    Comment: (NOTE) The GeneXpert MRSA Assay (FDA approved for NASAL specimens only), is one component of a comprehensive MRSA colonization surveillance program. It is not intended to diagnose MRSA infection nor to  guide or monitor treatment for MRSA infections. Test performance is not FDA approved in patients less than 75 years old. Performed at [facility], [place] [address], [place], [place] [address]   Culture, blood (Routine X 2) w Reflex to ID Panel     Status: None (Preliminary result)   Collection Time: 07/11/22  9:57 AM   Specimen: BLOOD  Result Value Ref Range Status   Specimen Description   Final    BLOOD BLOOD LEFT ARM Performed at [place] [address], [place], [place] [address]    Special Requests   Final    BOTTLES DRAWN AEROBIC AND ANAEROBIC Blood Culture adequate volume Performed at [place] [address], [place], [place] [address]    Culture   Final    NO GROWTH 3 DAYS Performed at [facility], [place] [address], [place], [place] [address]    Report Status PENDING  Incomplete  Culture, blood (Routine X 2) w Reflex to ID Panel     Status: None (Preliminary result)   Collection Time: 07/11/22  9:57 AM   Specimen: BLOOD  Result Value Ref Range Status   Specimen Description   Final    BLOOD BLOOD LEFT HAND Performed at [place] [address], [place], [place] [address]    Special Requests   Final    BOTTLES DRAWN AEROBIC AND ANAEROBIC Blood Culture adequate volume Performed at [place] [address], [place], [place] [address]    Culture   Final    NO GROWTH 3 DAYS Performed at [facility], [place] [address] [place], [place] [address]    Report Status PENDING  Incomplete  C Difficile Quick Screen (NO PCR Reflex)     Status: None   Collection Time: 07/12/22  8:24 AM   Specimen: STOOL  Result Value Ref Range Status   C Diff antigen NEGATIVE NEGATIVE Final   C Diff toxin NEGATIVE NEGATIVE Final   C Diff interpretation No C. difficile detected.  Final    Comment: Performed at [facility], [place] [address] [place], [place]  [address]  Gastrointestinal Panel by PCR , Stool     Status: None   Collection Time: 07/12/22  8:24 AM   Specimen: STOOL  Result Value Ref Range Status   Campylobacter species NOT DETECTED NOT DETECTED Final   Plesimonas shigelloides NOT DETECTED NOT DETECTED Final   Salmonella species NOT DETECTED NOT DETECTED Final   Yersinia enterocolitica NOT DETECTED NOT DETECTED Final   Vibrio species NOT DETECTED NOT  DETECTED Final   Vibrio cholerae NOT DETECTED NOT DETECTED Final   Enteroaggregative E coli (EAEC) NOT DETECTED NOT DETECTED Final   Enteropathogenic E coli (EPEC) NOT DETECTED NOT DETECTED Final   Enterotoxigenic E coli (ETEC) NOT DETECTED NOT DETECTED Final   Shiga like toxin producing E coli (STEC) NOT DETECTED NOT DETECTED Final   Shigella/Enteroinvasive E coli (EIEC) NOT DETECTED NOT DETECTED Final   Cryptosporidium NOT DETECTED NOT DETECTED Final   Cyclospora cayetanensis NOT DETECTED NOT DETECTED Final   Entamoeba histolytica NOT DETECTED NOT DETECTED Final   Giardia lamblia NOT DETECTED NOT DETECTED Final   Adenovirus F40/41 NOT DETECTED NOT DETECTED Final   Astrovirus NOT DETECTED NOT DETECTED Final   Norovirus GI/GII NOT DETECTED NOT DETECTED Final   Rotavirus A NOT DETECTED NOT DETECTED Final   Sapovirus (I, II, IV, and V) NOT DETECTED NOT DETECTED Final    Comment: Performed at [facility], [place] [place], [place] [address]    Labs: Results for orders placed or performed during the hospital encounter of 07/01/22 (from the past 48 hour(s))  Glucose, capillary     Status: Abnormal   Collection Time: 07/13/22 11:43 AM  Result Value Ref Range   Glucose-Capillary 145 (H) 70 - 99 mg/dL    Comment: Glucose reference range applies only to samples taken after fasting for at least 8 hours.   Comment 1 Notify RN    Comment 2 Document in Chart   Glucose, capillary     Status: Abnormal   Collection Time: 07/13/22  5:27 PM  Result Value Ref Range    Glucose-Capillary 142 (H) 70 - 99 mg/dL    Comment: Glucose reference range applies only to samples taken after fasting for at least 8 hours.   Comment 1 Notify RN    Comment 2 Document in Chart   Glucose, capillary     Status: Abnormal   Collection Time: 07/13/22 11:42 PM  Result Value Ref Range   Glucose-Capillary 140 (H) 70 - 99 mg/dL    Comment: Glucose reference range applies only to samples taken after fasting for at least 8 hours.  Comprehensive metabolic panel     Status: Abnormal   Collection Time: 07/14/22  4:12 AM  Result Value Ref Range   Sodium 137 135 - 145 mmol/L   Potassium 4.5 3.5 - 5.1 mmol/L   Chloride 111 98 - 111 mmol/L   CO2 22 22 - 32 mmol/L   Glucose, Bld 101 (H) 70 - 99 mg/dL    Comment: Glucose reference range applies only to samples taken after fasting for at least 8 hours.   BUN 42 (H) 8 - 23 mg/dL   Creatinine, Ser 1.47 (H) 0.61 - 1.24 mg/dL   Calcium 7.9 (L) 8.9 - 10.3 mg/dL   Total Protein 6.9 6.5 - 8.1 g/dL   Albumin 2.6 (L) 3.5 - 5.0 g/dL   AST 75 (H) 15 - 41 U/L   ALT 105 (H) 0 - 44 U/L   Alkaline Phosphatase 81 38 - 126 U/L   Total Bilirubin 0.6 0.3 - 1.2 mg/dL   GFR, Estimated 53 (L) >60 mL/min    Comment: (NOTE) Calculated using the CKD-EPI Creatinine Equation (2021)    Anion gap 4 (L) 5 - 15    Comment: Performed at [facility], [place] [address], [place], [place] [address]  Glucose, capillary     Status: Abnormal   Collection Time: 07/14/22  5:54 AM  Result  Value Ref Range   Glucose-Capillary 146 (H) 70 - 99 mg/dL    Comment: Glucose reference range applies only to samples taken after fasting for at least 8 hours.  Glucose, capillary     Status: Abnormal   Collection Time: 07/14/22 11:34 AM  Result Value Ref Range   Glucose-Capillary 149 (H) 70 - 99 mg/dL    Comment: Glucose reference range applies only to samples taken after fasting for at least 8 hours.  Glucose, capillary     Status: Abnormal   Collection Time:  07/14/22  5:39 PM  Result Value Ref Range   Glucose-Capillary 103 (H) 70 - 99 mg/dL    Comment: Glucose reference range applies only to samples taken after fasting for at least 8 hours.  Glucose, capillary     Status: Abnormal   Collection Time: 07/15/22 12:10 AM  Result Value Ref Range   Glucose-Capillary 180 (H) 70 - 99 mg/dL    Comment: Glucose reference range applies only to samples taken after fasting for at least 8 hours.  Comprehensive metabolic panel     Status: Abnormal   Collection Time: 07/15/22  2:50 AM  Result Value Ref Range   Sodium 134 (L) 135 - 145 mmol/L   Potassium 4.3 3.5 - 5.1 mmol/L   Chloride 105 98 - 111 mmol/L   CO2 24 22 - 32 mmol/L   Glucose, Bld 159 (H) 70 - 99 mg/dL    Comment: Glucose reference range applies only to samples taken after fasting for at least 8 hours.   BUN 41 (H) 8 - 23 mg/dL   Creatinine, Ser 1.55 (H) 0.61 - 1.24 mg/dL   Calcium 7.8 (L) 8.9 - 10.3 mg/dL   Total Protein 7.1 6.5 - 8.1 g/dL   Albumin 2.4 (L) 3.5 - 5.0 g/dL   AST 70 (H) 15 - 41 U/L   ALT 112 (H) 0 - 44 U/L   Alkaline Phosphatase 87 38 - 126 U/L   Total Bilirubin 0.5 0.3 - 1.2 mg/dL   GFR, Estimated 49 (L) >60 mL/min    Comment: (NOTE) Calculated using the CKD-EPI Creatinine Equation (2021)    Anion gap 5 5 - 15    Comment: Performed at [facility], [place] [address], [place], [place] [address]  Magnesium     Status: None   Collection Time: 07/15/22  2:50 AM  Result Value Ref Range   Magnesium 1.8 1.7 - 2.4 mg/dL    Comment: Performed at [facility], [place] [address], [place], [place] [address]  Phosphorus     Status: None   Collection Time: 07/15/22  2:50 AM  Result Value Ref Range   Phosphorus 4.2 2.5 - 4.6 mg/dL    Comment: Performed at [facility], [place] [address] [place], [place] [address]  Triglycerides     Status: None   Collection Time: 07/15/22  2:50 AM  Result Value Ref Range   Triglycerides 87 <150  mg/dL    Comment: Performed at [facility], [place] [address], [place], [place] [address]  CBC with Differential/Platelet     Status: Abnormal   Collection Time: 07/15/22  2:50 AM  Result Value Ref Range   WBC 18.2 (H) 4.0 - 10.5 K/uL   RBC 3.09 (L) 4.22 - 5.81 MIL/uL   Hemoglobin 8.0 (L) 13.0 - 17.0 g/dL    Comment: Reticulocyte Hemoglobin testing may be clinically indicated, consider ordering this additional test [id number]  HCT 23.7 (L) 39.0 - 52.0 %   MCV 76.7 (L) 80.0 - 100.0 fL   MCH 25.9 (L) 26.0 - 34.0 pg   MCHC 33.8 30.0 - 36.0 g/dL   RDW 15.5 11.5 - 15.5 %   Platelets 550 (H) 150 - 400 K/uL   nRBC 0.0 0.0 - 0.2 %   Neutrophils Relative % 77 %   Neutro Abs 14.3 (H) 1.7 - 7.7 K/uL   Lymphocytes Relative 8 %   Lymphs Abs 1.4 0.7 - 4.0 K/uL   Monocytes Relative 11 %   Monocytes Absolute 1.9 (H) 0.1 - 1.0 K/uL   Eosinophils Relative 2 %   Eosinophils Absolute 0.3 0.0 - 0.5 K/uL   Basophils Relative 1 %   Basophils Absolute 0.1 0.0 - 0.1 K/uL   Immature Granulocytes 1 %   Abs Immature Granulocytes 0.18 (H) 0.00 - 0.07 K/uL    Comment: Performed at [facility], [place] [address], [place], [place] [address]  Glucose, capillary     Status: Abnormal   Collection Time: 07/15/22  6:32 AM  Result Value Ref Range   Glucose-Capillary 156 (H) 70 - 99 mg/dL    Comment: Glucose reference range applies only to samples taken after fasting for at least 8 hours.    Imaging / Studies: No results found.  Medications / Allergies: per chart  Antibiotics: Anti-infectives (From admission, onward)    Start     Dose/Rate Route Frequency Ordered Stop   07/10/22 0400  piperacillin-tazobactam (ZOSYN) IVPB 3.375 g  Status:  Discontinued        3.375 g 12.5 mL/hr over 240 Minutes Intravenous Every 8 hours 07/09/22 2040 07/10/22 0738   07/08/22 1100  piperacillin-tazobactam (ZOSYN) IVPB 3.375 g  Status:  Discontinued        3.375 g 12.5 mL/hr over 240  Minutes Intravenous Every 8 hours 07/08/22 1001 07/09/22 2040   07/08/22 1000  erythromycin 250 mg in sodium chloride 0.9 % 100 mL IVPB  Status:  Discontinued        250 mg 100 mL/hr over 60 Minutes Intravenous Every 12 hours 07/08/22 0739 07/09/22 0730   07/06/22 0915  erythromycin 250 mg in sodium chloride 0.9 % 100 mL IVPB  Status:  Discontinued        250 mg 100 mL/hr over 60 Minutes Intravenous Every 8 hours 07/06/22 0823 07/08/22 0739   07/03/22 1700  piperacillin-tazobactam (ZOSYN) IVPB 3.375 g        3.375 g 12.5 mL/hr over 240 Minutes Intravenous Every 8 hours 07/03/22 1612 07/08/22 0450   07/02/22 0000  cefoTEtan (CEFOTAN) 2 g in sodium chloride 0.9 % 100 mL IVPB        2 g 200 mL/hr over 30 Minutes Intravenous Every 12 hours 07/01/22 1737 07/02/22 0051   07/01/22 1400  neomycin (MYCIFRADIN) tablet 1,000 mg  Status:  Discontinued       See Hyperspace for full Linked Orders Report.   1,000 mg Oral 3 times per day 07/01/22 1054 07/01/22 1109   07/01/22 1400  metroNIDAZOLE (FLAGYL) tablet 1,000 mg  Status:  Discontinued       See Hyperspace for full Linked Orders Report.   1,000 mg Oral 3 times per day 07/01/22 1054 07/01/22 1109   07/01/22 1100  cefoTEtan (CEFOTAN) 2 g in sodium chloride 0.9 % 100 mL IVPB        2 g 200 mL/hr over 30 Minutes Intravenous On call to O.R. 07/01/22 1054  07/01/22 1204         Note: Portions of this report may have been transcribed using voice recognition software. Every effort was made to ensure accuracy; however, inadvertent computerized transcription errors may be present.   Any transcriptional errors that result from this process are unintentional.    Adin Hector, MD, FACS, MASCRS Esophageal, Gastrointestinal & Colorectal Surgery Robotic and Minimally Invasive Surgery  [facility]. [address], [place], [place] [address] [phone] Fax [phone] Main  CONTACT  INFORMATION:  Weekday (9AM-5PM): Call [facility] main office at [phone]  Weeknight (5PM-9AM) or Weekend/Holiday: Check [email or web address] (password " [email or web address]") for General Surgery [facility] coverage  (Please, do not use SecureChat as it is not reliable communication to reach operating surgeons for immediate patient care given surgeries/outpatient duties/clinic/cross-coverage/off post-call which would lead to a delay in care.  Epic staff messaging available for outptient concerns, but may not be answered for 48 hours or more).     07/15/2022  7:16 AM

## 2022-07-15 NOTE — Progress Notes (Addendum)
PHARMACY - TOTAL PARENTERAL NUTRITION CONSULT NOTE   Indication: Prolonged ileus  Patient Measurements: Height: '5\' 8"'$  (172.7 cm) Weight: 84.2 kg (185 lb 10 oz) IBW/kg (Calculated) : 68.4 TPN AdjBW (KG): 73.1 Body mass index is 28.22 kg/m. Usual Weight:   Assessment: 67 yo male with hx colon cancer diagnosed 2022 s/p transverse colectomy admitted for elective robotic ostomy takedown with lysis of adhesions, incarcerated incisional hernia repair on 07/01/22. Post-op course complicated by right lower lobe collapse and hypoxemia, transferred to stepdown on 07/03/22 for management.  Pharmacy consulted for TPN on 07/08/22 for prolonged ileus.  Glucose / Insulin: Hx DM2 A1c 5.1 (07/01/22) - Most CBGs in goal of 100-180 - 10 units SSI required Electrolytes: Na slightly low, others WNL Renal: CKDIII. SCr ~baseline, up 1.55 today. BUN elevated at 41 Hepatic: AST/ALT elevated and rising.  TG, Alk phos, T.bili WNL. Alb low Intake / Output: -UOP: 1900 mL, stool: significantly decreasing on fiber BID.  NG tube out -I/O Net: -108 ml MIVF: NS @ K[facility] GI Imaging: - 2/27 AXR: Previous postoperative pneumoperitoneum is less distinct and may have resolved or at least partially resolved. -2/29 Abdominal: Moderate dilation of small bowel loops, suggesting ileus. Linear collection of air adjacent to the right hemidiaphragm. Possibility of pneumoperitoneum is not excluded.  -3/1 CT Abd: No evidence of leak or abscess GI Surgeries / Procedures:  - 2/22: robotic colostomy takedown and lysis of adhesions  Central access: implanted port. Double lumen PICC placed 3/2 TPN start date: 2/29  Nutritional Goals: Goal TPN rate is 100 mL/hr (provides 130 g of protein, 360 g dextrose, and 2486 kcals per day)  RD Assessment: pending Estimated Needs Total Energy Estimated Needs: 2400-2600 kcals Total Protein Estimated Needs: 125-140 grams Total Fluid Estimated Needs: >/= 2.4L  Current Nutrition:  Diet advanced to  DYS3 - Calorie count ordered per Dr. Johney Maine - 10% of lunch documented 3/6 Boost/Breeze TID per RD - patient drinking all TPN  Plan:  Now: Mg Sulfate 2g IV x 1  At 1800: Continue TPN at goal rate, 100 mL/hr at 1800, per surgery, patient needs to be eating >50% for TPN to start weaning down  Electrolytes in TPN:  Na 16mq/L,  K 15 mEq/L,  Ca 030m/L,  Mg 50m150mL (increase),  Phos 44m8mL (decrease).  Cl:Ac 1:1 Continue enteral MVI as he is tolerating all PO meds.  Continue mSSI change to ACHS [facility] Monitor TPN labs on Mon/Thurs   ErinPeggyann JubaarmD, BCPS [facility] main pharmacy [phone] 6:49 AM  Addendum: May wean TPN to 1/2 rate per Dr. GrosJohney Maineerefore, will decrease rate to 50 ml/hr at 18:00 tonight.  ErinPeggyann JubaarmD, BCPS 07/15/2022 7:56 AM

## 2022-07-15 NOTE — Progress Notes (Signed)
Calorie Count Note  48 hour calorie count ordered.  Diet: Regular diet Supplements: Boost Breeze TID  Day 1 (3/6):  Breakfast: 0% Lunch: 10%  Dinner: 0% Supplements: 3 Boost Breeze  Total intake: 819 kcal (34% of minimum estimated needs)  30 protein (24% of minimum estimated needs)  Nutrition Dx: Moderate Malnutrition related to chronic illness (transverse colon mass) as evidenced by moderate fat depletion, moderate muscle depletion.   Goal:  Patient will meet greater than or equal to 90% of their needs   Subjective: Patient reports tolerating solid food for the first time yesterday. Had a few bites of spaghetti and broccoli for lunch. However, did not have a breakfast or dinner. Continues to enjoy and drink [employer] daily. Diet upgraded to Regular diet today to avoid restricting patient's intake. Patient agreeable to receive automatic trays and eat what he can of them. Per Surgery note today, plan to decrease TPN to 50% to help stimulate appetite.   Intervention:  - TPN to reduce to half of goal of 60m/hr per pharmacy. This provides 65g protein, 180g dextrose, 372 [place] kcals = 1243 kcal - TPN management per pharmacy.    - Regular diet as medically appropriate. - Boost Breeze po TID, each supplement provides 250 kcal and 9 grams of protein   - 48 Calorie count to run through the end of today (3/7).              - Please document % intake of all meals, snacks, and nutrition supplements patient consumes.              - If patient skips/refuses a meal please document 0%.             - Plan discussed with RN.   - Monitor weight trends.   ASamson FredericRD, LDN For contact information, [facility] to [facility]

## 2022-07-15 NOTE — Progress Notes (Signed)
PT Cancellation Note  Patient Details Name: Thomas [name] MRN: [id number] DOB: 1956/02/07   Cancelled Treatment:     attempted to see pt twice today Am........"already walked" and back in bed PM.......  NT in room, pt had just vomited  Per chart review, pt plans to D/C to SNF.    Rica Koyanagi  PTA Acute  Rehabilitation Services Office M-F          [phone] Weekend pager [phone]

## 2022-07-15 NOTE — Progress Notes (Signed)
  Progress Note   Patient: Thomas [name] [id number] DOB: 1956/02/02 DOA: 07/01/2022     14 DOS: the patient was seen and examined on 07/15/2022 at at 8:44AM      Brief hospital course: Thomas [name] is a 67 y.o. M with DM, HTN, colon CA, hx stroke, seizures, meningioma who presented for electrive ostomy takedown.  Post-operative course complicated by delirium and hypoxia from atelectasis/mucus plugging/aspiration, medicine consulted.        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  Patient says it is old.  2022 echo mentioned some aortic sclerosis only. - Obtain echo  Normocytic anemia - Repeat Hgb 2 weeks after discharge  Hyperlipidemia History of ischemic left MCA stroke 2015 - Continue Crestor  Epilepsy ([place]) Controlled - Continue Keppra  Hypertension BP high normal - Continue amlodipine  Type 2 diabetes mellitus with diabetic nephropathy, without long-term current use of insulin (HCC) Glucoses well controlled - Continue SS corrections - None needed at discharge  Transaminitis Likely from TPN - Repeat LFTs in 4-6 weeks with PCP          Subjective: No complaints, no nursing concerns.     Physical Exam: BP (!) 140/71 (BP Location: Right Arm)   Pulse 87   Temp 98.6 F (37 C) (Oral)   Resp 18   Ht '5\' 8"'$  (1.727 m)   Wt 84.2 kg   SpO2 96%   BMI 28.22 kg/m   Adult male, lying in bed, no acute distress RRR, loud systolic murmur, no peripheral edema Respiratory rate normal, lungs clear without rales or wheezes Abdomen soft without tenderness palpation or guarding    Data Reviewed: Comprehensive metabolic panel with mild transaminitis, normal renal function CBC unremarkable    Family Communication: None  present    Disposition: Status is: Inpatient From a medical standpoint, things appear stable, and I suspect the patient could be discharged whenever deemed ready by general surgery`(and they are able to wean off TPN)        Author: Edwin Dada, MD 07/15/2022 11:24 AM  For on call review [email or web address].

## 2022-07-16 ENCOUNTER — Other Ambulatory Visit (HOSPITAL_COMMUNITY): Payer: Self-pay

## 2022-07-16 ENCOUNTER — Inpatient Hospital Stay (HOSPITAL_COMMUNITY): Payer: Medicare PPO

## 2022-07-16 DIAGNOSIS — G40909 Epilepsy, unspecified, not intractable, without status epilepticus: Secondary | ICD-10-CM | POA: Diagnosis not present

## 2022-07-16 DIAGNOSIS — D649 Anemia, unspecified: Secondary | ICD-10-CM | POA: Diagnosis not present

## 2022-07-16 DIAGNOSIS — R011 Cardiac murmur, unspecified: Secondary | ICD-10-CM | POA: Diagnosis not present

## 2022-07-16 DIAGNOSIS — C184 Malignant neoplasm of transverse colon: Secondary | ICD-10-CM | POA: Diagnosis not present

## 2022-07-16 DIAGNOSIS — N1832 Chronic kidney disease, stage 3b: Secondary | ICD-10-CM | POA: Diagnosis not present

## 2022-07-16 LAB — ECHOCARDIOGRAM COMPLETE
AR max vel: 1.84 cm2
AV Area VTI: 1.75 cm2
AV Area mean vel: 1.77 cm2
AV Mean grad: 20 mmHg
AV Peak grad: 32.5 mmHg
Ao pk vel: 2.85 m/s
Area-P 1/2: 3.72 cm2
Height: 68 in
P 1/2 time: 432 msec
S' Lateral: 3.1 cm
Weight: 3174.62 oz

## 2022-07-16 LAB — COMPREHENSIVE METABOLIC PANEL
ALT: 311 U/L — ABNORMAL HIGH (ref 0–44)
ALT: 350 U/L — ABNORMAL HIGH (ref 0–44)
AST: 305 U/L — ABNORMAL HIGH (ref 15–41)
AST: 279 U/L — ABNORMAL HIGH (ref 15–41)
Albumin: 2.5 g/dL — ABNORMAL LOW (ref 3.5–5.0)
Albumin: 2.6 g/dL — ABNORMAL LOW (ref 3.5–5.0)
Alkaline Phosphatase: 146 U/L — ABNORMAL HIGH (ref 38–126)
Alkaline Phosphatase: 155 U/L — ABNORMAL HIGH (ref 38–126)
Anion gap: 8 (ref 5–15)
Anion gap: 7 (ref 5–15)
BUN: 37 mg/dL — ABNORMAL HIGH (ref 8–23)
BUN: 33 mg/dL — ABNORMAL HIGH (ref 8–23)
CO2: 23 mmol/L (ref 22–32)
CO2: 23 mmol/L (ref 22–32)
Calcium: 8.2 mg/dL — ABNORMAL LOW (ref 8.9–10.3)
Calcium: 8.1 mg/dL — ABNORMAL LOW (ref 8.9–10.3)
Chloride: 103 mmol/L (ref 98–111)
Chloride: 103 mmol/L (ref 98–111)
Creatinine, Ser: 1.45 mg/dL — ABNORMAL HIGH (ref 0.61–1.24)
Creatinine, Ser: 1.48 mg/dL — ABNORMAL HIGH (ref 0.61–1.24)
GFR, Estimated: 53 mL/min — ABNORMAL LOW (ref >60)
GFR, Estimated: 52 mL/min — ABNORMAL LOW (ref >60)
Glucose, Bld: 142 mg/dL — ABNORMAL HIGH (ref 70–99)
Glucose, Bld: 142 mg/dL — ABNORMAL HIGH (ref 70–99)
Potassium: 4.5 mmol/L (ref 3.5–5.1)
Potassium: 4.6 mmol/L (ref 3.5–5.1)
Sodium: 134 mmol/L — ABNORMAL LOW (ref 135–145)
Sodium: 133 mmol/L — ABNORMAL LOW (ref 135–145)
Total Bilirubin: 0.7 mg/dL (ref 0.3–1.2)
Total Bilirubin: 0.6 mg/dL (ref 0.3–1.2)
Total Protein: 7.2 g/dL (ref 6.5–8.1)
Total Protein: 7.7 g/dL (ref 6.5–8.1)

## 2022-07-16 LAB — GLUCOSE, CAPILLARY
Glucose-Capillary: 132 mg/dL — ABNORMAL HIGH (ref 70–99)
Glucose-Capillary: 149 mg/dL — ABNORMAL HIGH (ref 70–99)
Glucose-Capillary: 156 mg/dL — ABNORMAL HIGH (ref 70–99)
Glucose-Capillary: 137 mg/dL — ABNORMAL HIGH (ref 70–99)

## 2022-07-16 LAB — CULTURE, BLOOD (ROUTINE X 2)
Culture: NO GROWTH
Culture: NO GROWTH
Special Requests: ADEQUATE
Special Requests: ADEQUATE

## 2022-07-16 LAB — PHOSPHORUS: Phosphorus: 3.7 mg/dL (ref 2.5–4.6)

## 2022-07-16 LAB — MAGNESIUM: Magnesium: 2.2 mg/dL (ref 1.7–2.4)

## 2022-07-16 MED ORDER — TRAVASOL 10 % IV SOLN
INTRAVENOUS | Status: AC
  Filled 2022-07-16: qty 684

## 2022-07-16 MED ORDER — METOCLOPRAMIDE HCL 5 MG PO TABS
5.0000 mg | ORAL_TABLET | Freq: Three times a day (TID) | ORAL | Status: DC
  Administered 2022-07-16 – 2022-07-20 (×13): 5 mg via ORAL
  Filled 2022-07-16 (×13): qty 1

## 2022-07-16 MED ORDER — METOCLOPRAMIDE HCL 5 MG PO TABS
5.0000 mg | ORAL_TABLET | Freq: Three times a day (TID) | ORAL | 1 refills | Status: DC
  Filled 2022-07-16: qty 20, 7d supply, fill #0

## 2022-07-16 NOTE — Discharge Summary (Signed)
Physician Discharge Summary    Patient ID: Thomas [name] MRN: [id number] DOB/AGE: 06/15/1955  67 y.o.  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   History of ischemic left MCA stroke 2015   Type 2 diabetes mellitus with diabetic nephropathy, without long-term current use of insulin (HCC)   Hypertension   Diverticulosis of colon without hemorrhage   Diabetic polyneuropathy associated with type 2 diabetes mellitus ([place])   Colon cancer ([place])   Iron deficiency anemia due to chronic blood loss   Protein-calorie malnutrition, moderate (HCC)   Duodenal ulcer disease   Epilepsy ([place])   Hyperlipidemia   Delirium   Hypokalemia   Incisional hernia, without obstruction or gangrene   CKD (chronic kidney disease) stage 3b   Hypophosphatemia   Hiccoughs   Normocytic anemia   Atelectasis   Hyperglycemia   Hypomagnesemia   Status post colostomy takedown   Bilateral pleural effusion   26 Days Post-Op  07/01/2022  POST-OPERATIVE DIAGNOSIS:   COLOSTOMY FOR COLON RESECTION INCARCERATED INCISIONAL HERNIA   PROCEDURE:   TAKEDOWN OF END COLOSTOMY WITH ANASTOMOSIS INTRAOPERATIVE ASSESSMENT OF TISSUE VASCULAR PERFUSION USING ICG (indocyanine green) IMMUNOFLUORESCENCE TRANSVERSUS ABDOMINIS PLANE (TAP) BLOCK - BILATERAL LYSIS OF ADHESIONS x90 MINUTES (50% OF CASE) PRIMARY REPAIR OF INCARCERATED INCISIONAL HERNIA   SURGEON:  Adin Hector, MD   OR FINDINGS:    Patient had  very dense intra-abdominal adhesions of small bowel to all quadrants of the abdomen as well as colon.  He had a loop of small bowel incarcerated in a periumbilical midline incisional hernia 4 x 4 cm.  Reduced and primarily repaired.   No obvious metastatic disease on visceral parietal peritoneum or liver.   It is an isoperistaltic ileocolonic anastomosis (proximal transverse colon to distal transverse colon) that rests in the epigastric region.    Consults: Case Management / Social Work, Physical Therapy, Occupational Therapy, Pharmacy, Nutrition, Anesthesia, Respiratory Therapy, Internal Medicine (Hospitalist), [place], and Pulmonology  Hospital Course:   Patient with history of colon obstruction requiring urgent Hartmann resection complicated with multisystem organ failure.  Found to have cancer.  Eventually recovered.  Underwent chemotherapy.  2 years later desired colostomy takedown.  The patient underwent  the surgery above.  Initially was able to follow enhance recovery pathway tolerating some liquids.  However developed bloating and nausea and vomiting.  Some hypoxia.  Concern for aspiration event.  Transfer to stepdown unit.  Pulmonary critical care medicine involved.  Good work with pulmonary toilet and antibiotics.  Weaned oxygen.  Was never on pressors.  Did get rather confused and delirious with sundowning.  Anxiety and seizure medicines given parenterally.  Patient did struggle with prolonged ileus.  Had NG tube and bowel rest.  Ultimately placed on TPN.  Developed some leukocytosis and pain.  CT scan with enteral rectal contrast disproved leak and no abscess elsewhere.  Eventually bowels opened up and was able to tolerate clamping trial.  Gradually advanced on diet.  Speech therapy was involved to disprove any major dysphagia.  Postoperatively, the patient  gradually mobilized and advanced to a solid diet.  Repeat CT Scan a week later improved.  Pain and other symptoms were  treated aggressively.  Phosphorus, magnesium, potassium repleted.  Aggressive bowel regimen including metoclopramide, erythromycin, MiraLAX, Senokot used.  Mobilization as well as prone positioning and rotation.  Worked with therapies.  Gastrology was curbside consulted as well for advice.  Agreed with this plan.  Eventually ileus overridden.  Some post ileus diarrhea = backed off.  He was proven C. difficile negative earlier in his admission.  Stabilized with just 1 MiraLAX dose and 1 Senokot dose a day with as needed backup.  Nutrition numbers improved.  Prealbumin normal.  Albumin above 2.  Tolerating solid diet.  Parenteral nutrition weaned off.  By the time of discharge, the patient was walking with help with therapies.  Fair appetite so kept on TPN but eventually weaned down.  Planning team involved felt he would benefit from more aggressive physical and occupational therapy in a skilled nursing facility with temporary rehab.  He was amenable to it.  Based on meeting discharge criteria and continuing to recover, I felt it was safe for the patient to be discharged from the hospital to further recover with close followup. Postoperative recommendations were discussed in detail.  They are written as well.  Discharged Condition: Fair but stabilizing  Discharge Exam: Blood pressure 132/69, pulse 80, temperature 98 F (36.7 C), temperature source Oral, resp. rate 18, height 5\' 8"  (1.727 m), weight 92.3 kg, SpO2 98 %.  General: Pt awake/alert/oriented x4 in No acute distress Eyes: PERRL, normal EOM.  Sclera clear.  No icterus Neuro: CN II-XII intact w/o focal sensory/motor deficits. Lymph: No head/neck/groin lymphadenopathy Psych:  No delerium/psychosis/paranoia HENT: Normocephalic, Mucus membranes moist.  No thrush Neck: Supple, No tracheal deviation Chest:  No chest wall pain w good excursion CV:  Pulses intact.  Regular rhythm MS: Normal AROM mjr joints.  No obvious deformity Abdomen: Soft.   Mildly distended.  Nontender.  No evidence of peritonitis.  No incarcerated hernias. Ext:  SCDs BLE.  No mjr edema.  No cyanosis Skin: No petechiae / purpura   Disposition:    Follow-up Information     Michael Boston, MD Follow up on 08/23/2022.   Specialties: General Surgery, Colon and Rectal Surgery Contact information: [place] [address] [phone]         [facility] [place] Follow up.   Specialty: [place] Why: A representative with [facility] will contact you after discharge from the hospital, regarding home health physical therapy and occupational therapy services. Contact information: [address] [place] [address] [phone]         [name], NP Follow up.   Why: follow up with PCP in 2-4 weeks for post hospital follow up and to recheck your liver function. Hold statin until follow up. Contact information: [place] [address] [phone]                   Discharge Instructions     Call MD for:   Complete by: As directed    FEVER > 101.5 F  (temperatures < 101.5 F are not significant)   Call MD for:  extreme fatigue   Complete by: As directed    Call MD for:  persistant dizziness or light-headedness   Complete by: As directed    Call MD for:  persistant nausea and vomiting  Complete by: As directed    Call MD for:  redness, tenderness, or signs of infection (pain, swelling, redness, odor or green/yellow discharge around incision site)   Complete by: As directed    Call MD for:  severe uncontrolled pain   Complete by: As directed    Diet - low sodium heart healthy   Complete by: As directed    Start with a bland diet such as soups, liquids, starchy foods, low fat foods, etc. the first few days at home. Gradually advance to a solid, low-fat, high fiber diet by the end of the first week at home.   Add a fiber supplement to your diet (Metamucil,  etc) If you feel full, bloated, or constipated, stay on a full liquid or pureed/blenderized diet for a few days until you feel better and are no longer constipated.   Discharge instructions   Complete by: As directed    See Discharge Instructions If you are not getting better after two weeks or are noticing you are getting worse, contact our office [phone] for further advice.  We may need to adjust your medications, re-evaluate you in the office, send you to the emergency room, or see what other things we can do to help. The clinic staff is available to answer your questions during regular business hours (8:30am-5pm).  Please don't hesitate to call and ask to speak to one of our nurses for clinical concerns.    A surgeon from [facility] is always on call at the hospitals 24 hours/day If you have a medical emergency, go to the nearest emergency room or call 911.   Discharge wound care:   Complete by: As directed    It is good for closed incisions and even open wounds to be washed every day.  Shower every day.  Short baths are fine.  Wash the incisions and wounds clean with soap & water.    You may leave closed incisions open to air if it is dry.   You may cover the incision with clean gauze & replace it after your daily shower for comfort.  TEGADERM:  You have clear gauze band-aid dressings over your closed incision(s).  Remove the dressings 3 days after surgery.   Driving Restrictions   Complete by: As directed    You may drive when: - you are no longer taking narcotic prescription pain medication - you can comfortably wear a seatbelt - you can safely make sudden turns/stops without pain.   Increase activity slowly   Complete by: As directed    Start light daily activities --- self-care, walking, climbing stairs- beginning the day after surgery.  Gradually increase activities as tolerated.  Control your pain to be active.  Stop when you are tired.  Ideally, walk several  times a day, eventually an hour a day.   Most people are back to most day-to-day activities in a few weeks.  It takes 4-6 weeks to get back to unrestricted, intense activity. If you can walk 30 minutes without difficulty, it is safe to try more intense activity such as jogging, treadmill, bicycling, low-impact aerobics, swimming, etc. Save the most intensive and strenuous activity for last (Usually 4-8 weeks after surgery) such as sit-ups, heavy lifting, contact sports, etc.  Refrain from any intense heavy lifting or straining until you are off narcotics for pain control.  You will have off days, but things should improve week-by-week. DO NOT PUSH THROUGH PAIN.  Let pain be your guide: If  it hurts to do something, don't do it.   Lifting restrictions   Complete by: As directed    If you can walk 30 minutes without difficulty, it is safe to try more intense activity such as jogging, treadmill, bicycling, low-impact aerobics, swimming, etc. Save the most intensive and strenuous activity for last (Usually 4-8 weeks after surgery) such as sit-ups, heavy lifting, contact sports, etc.   Refrain from any intense heavy lifting or straining until you are off narcotics for pain control.  You will have off days, but things should improve week-by-week. DO NOT PUSH THROUGH PAIN.  Let pain be your guide: If it hurts to do something, don't do it.  Pain is your body warning you to avoid that activity for another week until the pain goes down.   May shower / Bathe   Complete by: As directed    May walk up steps   Complete by: As directed    Sexual Activity Restrictions   Complete by: As directed    You may have sexual intercourse when it is comfortable. If it hurts to do something, stop.       Allergies as of 07/27/2022       Reactions   Morphine And Related Other (See Comments)   Throat swelling (can tolerate oxycodone/hydrocodone/hydromorphone)        Medication List     TAKE these medications     acetaminophen 500 MG tablet Commonly known as: TYLENOL Take 500-1,000 mg by mouth every 6 (six) hours as needed (pain.).   allopurinol 100 MG tablet Commonly known as: ZYLOPRIM Take 100 mg by mouth at bedtime.   amLODipine 10 MG tablet Commonly known as: NORVASC Take 10 mg by mouth in the morning.   atorvastatin 20 MG tablet Commonly known as: LIPITOR Take 20 mg by mouth in the morning.   gabapentin 100 MG capsule Commonly known as: NEURONTIN Take 100-200 mg by mouth See admin instructions. Take 1 capsule (100 mg) by mouth in the morning & take 2 capsules (200 mg) by mouth in the evening.   HYDROcodone-acetaminophen 5-325 MG tablet Commonly known as: NORCO/VICODIN Take 1-2 tablets by mouth every 6 (six) hours as needed for moderate pain or severe pain.   levETIRAcetam 500 MG tablet Commonly known as: KEPPRA Take 500 mg by mouth 2 (two) times daily.   LORazepam 0.5 MG tablet Commonly known as: ATIVAN Take 0.5 mg by mouth 2 (two) times daily as needed for anxiety.   metoCLOPramide 5 MG tablet Commonly known as: REGLAN Take 1 tablet (5 mg total) by mouth every 6 (six) hours as needed for nausea, refractory nausea / vomiting or vomiting.   omeprazole 20 MG capsule Commonly known as: PRILOSEC Take 20 mg by mouth in the morning and at bedtime.   polyethylene glycol 17 g packet Commonly known as: MIRALAX / GLYCOLAX Take 17 g by mouth daily.   polyethylene glycol 17 g packet Commonly known as: MIRALAX / GLYCOLAX Take 17 g by mouth every 12 (twelve) hours as needed for mild constipation, moderate constipation or severe constipation.   potassium chloride SA 20 MEQ tablet Commonly known as: KLOR-CON M Take 2 tablets (40 mEq total) by mouth 2 (two) times daily for 5 days.   senna 8.6 MG Tabs tablet Commonly known as: SENOKOT Take 1 tablet (8.6 mg total) by mouth daily.   simethicone 80 MG chewable tablet Commonly known as: MYLICON Chew 1 tablet (80 mg total) by mouth  4 (four) times  daily as needed for flatulence (Bloating).   UDDERLY SMOOTH EX Apply 1 Application topically as needed (dry/irritated skin).               Discharge Care Instructions  (From admission, onward)           Start     Ordered   07/01/22 0000  Discharge wound care:       Comments: It is good for closed incisions and even open wounds to be washed every day.  Shower every day.  Short baths are fine.  Wash the incisions and wounds clean with soap & water.    You may leave closed incisions open to air if it is dry.   You may cover the incision with clean gauze & replace it after your daily shower for comfort.  TEGADERM:  You have clear gauze band-aid dressings over your closed incision(s).  Remove the dressings 3 days after surgery.   07/01/22 1531            Significant Diagnostic Studies:  Results for orders placed or performed during the hospital encounter of 07/01/22 (from the past 72 hour(s))  Glucose, capillary     Status: Abnormal   Collection Time: 07/24/22 11:31 AM  Result Value Ref Range   Glucose-Capillary 152 (H) 70 - 99 mg/dL    Comment: Glucose reference range applies only to samples taken after fasting for at least 8 hours.  Glucose, capillary     Status: Abnormal   Collection Time: 07/24/22  5:57 PM  Result Value Ref Range   Glucose-Capillary 152 (H) 70 - 99 mg/dL    Comment: Glucose reference range applies only to samples taken after fasting for at least 8 hours.  Glucose, capillary     Status: Abnormal   Collection Time: 07/24/22 11:41 PM  Result Value Ref Range   Glucose-Capillary 131 (H) 70 - 99 mg/dL    Comment: Glucose reference range applies only to samples taken after fasting for at least 8 hours.  Basic metabolic panel     Status: Abnormal   Collection Time: 07/25/22  2:54 AM  Result Value Ref Range   Sodium 138 135 - 145 mmol/L   Potassium 3.4 (L) 3.5 - 5.1 mmol/L   Chloride 109 98 - 111 mmol/L   CO2 23 22 - 32 mmol/L    Glucose, Bld 146 (H) 70 - 99 mg/dL    Comment: Glucose reference range applies only to samples taken after fasting for at least 8 hours.   BUN 32 (H) 8 - 23 mg/dL   Creatinine, Ser 1.34 (H) 0.61 - 1.24 mg/dL   Calcium 8.0 (L) 8.9 - 10.3 mg/dL   GFR, Estimated 59 (L) >60 mL/min    Comment: (NOTE) Calculated using the CKD-EPI Creatinine Equation (2021)    Anion gap 6 5 - 15    Comment: Performed at [facility], [place] [address], [place], [place] [address]  CBC     Status: Abnormal   Collection Time: 07/25/22  2:54 AM  Result Value Ref Range   WBC 12.1 (H) 4.0 - 10.5 K/uL   RBC 3.01 (L) 4.22 - 5.81 MIL/uL   Hemoglobin 7.6 (L) 13.0 - 17.0 g/dL    Comment: Reticulocyte Hemoglobin testing may be clinically indicated, consider ordering this additional test [id number]    HCT 22.9 (L) 39.0 - 52.0 %   MCV 76.1 (L) 80.0 - 100.0 fL   MCH 25.2 (L) 26.0 - 34.0 pg  MCHC 33.2 30.0 - 36.0 g/dL   RDW 15.1 11.5 - 15.5 %   Platelets 465 (H) 150 - 400 K/uL   nRBC 0.0 0.0 - 0.2 %    Comment: Performed at [facility], [place] [address] [place], [place] [address]  Glucose, capillary     Status: Abnormal   Collection Time: 07/25/22  5:43 AM  Result Value Ref Range   Glucose-Capillary 184 (H) 70 - 99 mg/dL    Comment: Glucose reference range applies only to samples taken after fasting for at least 8 hours.  Glucose, capillary     Status: Abnormal   Collection Time: 07/25/22 11:54 AM  Result Value Ref Range   Glucose-Capillary 157 (H) 70 - 99 mg/dL    Comment: Glucose reference range applies only to samples taken after fasting for at least 8 hours.  Glucose, capillary     Status: Abnormal   Collection Time: 07/25/22  5:56 PM  Result Value Ref Range   Glucose-Capillary 127 (H) 70 - 99 mg/dL    Comment: Glucose reference range applies only to samples taken after fasting for at least 8 hours.  Glucose, capillary     Status: Abnormal   Collection Time: 07/25/22 11:45  PM  Result Value Ref Range   Glucose-Capillary 137 (H) 70 - 99 mg/dL    Comment: Glucose reference range applies only to samples taken after fasting for at least 8 hours.  Comprehensive metabolic panel     Status: Abnormal   Collection Time: 07/26/22  2:50 AM  Result Value Ref Range   Sodium 137 135 - 145 mmol/L   Potassium 3.3 (L) 3.5 - 5.1 mmol/L   Chloride 108 98 - 111 mmol/L   CO2 23 22 - 32 mmol/L   Glucose, Bld 123 (H) 70 - 99 mg/dL    Comment: Glucose reference range applies only to samples taken after fasting for at least 8 hours.   BUN 30 (H) 8 - 23 mg/dL   Creatinine, Ser 1.26 (H) 0.61 - 1.24 mg/dL   Calcium 8.0 (L) 8.9 - 10.3 mg/dL   Total Protein 6.9 6.5 - 8.1 g/dL   Albumin 2.2 (L) 3.5 - 5.0 g/dL   AST 41 15 - 41 U/L   ALT 49 (H) 0 - 44 U/L   Alkaline Phosphatase 71 38 - 126 U/L   Total Bilirubin 0.2 (L) 0.3 - 1.2 mg/dL   GFR, Estimated >60 >60 mL/min    Comment: (NOTE) Calculated using the CKD-EPI Creatinine Equation (2021)    Anion gap 6 5 - 15    Comment: Performed at [facility], [place] [address], [place], [place] [address]  Magnesium     Status: Abnormal   Collection Time: 07/26/22  2:50 AM  Result Value Ref Range   Magnesium 1.6 (L) 1.7 - 2.4 mg/dL    Comment: Performed at [facility], [place] [address], [place], [place] [address]  Phosphorus     Status: None   Collection Time: 07/26/22  2:50 AM  Result Value Ref Range   Phosphorus 3.1 2.5 - 4.6 mg/dL    Comment: Performed at [facility], [place] [address] [place], [place] [address]  Triglycerides     Status: None   Collection Time: 07/26/22  2:50 AM  Result Value Ref Range   Triglycerides 89 <150 mg/dL    Comment: Performed at [facility], [place] [address] [place], [place] [address]  Glucose, capillary  Status: Abnormal   Collection Time: 07/26/22  6:07 AM  Result Value Ref Range   Glucose-Capillary 141 (H) 70 - 99 mg/dL     Comment: Glucose reference range applies only to samples taken after fasting for at least 8 hours.  Glucose, capillary     Status: Abnormal   Collection Time: 07/26/22 11:25 AM  Result Value Ref Range   Glucose-Capillary 168 (H) 70 - 99 mg/dL    Comment: Glucose reference range applies only to samples taken after fasting for at least 8 hours.  Glucose, capillary     Status: Abnormal   Collection Time: 07/26/22  5:35 PM  Result Value Ref Range   Glucose-Capillary 146 (H) 70 - 99 mg/dL    Comment: Glucose reference range applies only to samples taken after fasting for at least 8 hours.  Glucose, capillary     Status: Abnormal   Collection Time: 07/26/22 10:30 PM  Result Value Ref Range   Glucose-Capillary 137 (H) 70 - 99 mg/dL    Comment: Glucose reference range applies only to samples taken after fasting for at least 8 hours.  Basic metabolic panel     Status: Abnormal   Collection Time: 07/27/22  3:16 AM  Result Value Ref Range   Sodium 136 135 - 145 mmol/L   Potassium 2.8 (L) 3.5 - 5.1 mmol/L   Chloride 106 98 - 111 mmol/L   CO2 21 (L) 22 - 32 mmol/L   Glucose, Bld 160 (H) 70 - 99 mg/dL    Comment: Glucose reference range applies only to samples taken after fasting for at least 8 hours.   BUN 30 (H) 8 - 23 mg/dL   Creatinine, Ser 1.27 (H) 0.61 - 1.24 mg/dL   Calcium 7.9 (L) 8.9 - 10.3 mg/dL   GFR, Estimated >60 >60 mL/min    Comment: (NOTE) Calculated using the CKD-EPI Creatinine Equation (2021)    Anion gap 9 5 - 15    Comment: Performed at [facility], [place] [address], [place], [place] [address]  Prealbumin     Status: None   Collection Time: 07/27/22  3:16 AM  Result Value Ref Range   Prealbumin 21 18 - 38 mg/dL    Comment: Performed at [facility] [address], [place], [place] [address]  Magnesium     Status: None   Collection Time: 07/27/22  3:16 AM  Result Value Ref Range   Magnesium 2.0 1.7 - 2.4 mg/dL    Comment: Performed at [facility], [place] [address], [place],  [address]    No results found.  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   Dizziness    Duodenal ulcer disease    Epilepsy ([place])    takes Keppra daily   GERD (gastroesophageal reflux disease)    takes Omeprazole daily   Headache    Heart murmur    Hyperlipidemia    takes Atorvastatin daily  Hypertension    takes Lotrel daily   Meningioma ([place]) 09/01/2016   Peripheral edema    takes Lasix daily   Peripheral neuropathy    takes Gabapentin daily   Seizures ([place])    Sleep apnea    no cpap   Urinary frequency    Viral gastroenteritis 09/29/2021    Past Surgical History:  Procedure Laterality Date   BIOPSY  09/05/2020   Procedure: BIOPSY;  Surgeon: Irene Shipper, MD;  [facility] END[facility]COPY;  Service: Endoscopy;;   BIOPSY  02/06/2021   Procedure: BIOPSY;  Surgeon: Jerene Bears, MD;  Location: [facility] ENDOSCOPY;  Service: Gastroenterology;;   CARDIAC CATHETERIZATION  7 yrs ago   COLONOSCOPY WITH PROPOFOL N/A 09/05/2020   Procedure: COLONOSCOPY WITH PROPOFOL;  Surgeon: Irene Shipper, MD;  Location: [facility] ENDOSCOPY;  Service: Endoscopy;  Laterality: N/A;   CRANIOTOMY N/A 09/01/2016   Procedure: CRANIOTOMY TUMOR  LEFT PTERIONAL;  Surgeon: Ashok Pall, MD;  Location: [facility];  Service: Neurosurgery;  Laterality: N/A;   cyst removed from chest      as a child   ESOPHAGOGASTRODUODENOSCOPY (EGD) WITH PROPOFOL N/A 02/06/2021   Procedure: ESOPHAGOGASTRODUODENOSCOPY (EGD)  WITH PROPOFOL;  Surgeon: Jerene Bears, MD;  Location: [facility] ENDOSCOPY;  Service: Gastroenterology;  Laterality: N/A;   INGUINAL HERNIA REPAIR N/A 07/01/2022   Procedure: HERNIA REPAIR INGUINAL INCARCERATED;  Surgeon: Michael Boston, MD;  Location: [facility] ORS;  Service: General;  Laterality: N/A;   IR FLUORO GUIDE CV LINE LEFT  12/22/2020   IR GASTROSTOMY TUBE MOD SED  02/10/2021   IR GASTROSTOMY TUBE REMOVAL  04/15/2021   IR REMOVAL TUN CV CATH W/O FL  03/09/2021   IR US GUIDE VASC ACCESS LEFT  12/22/2020   LYSIS OF ADHESION N/A 07/01/2022   Procedure: LYSIS OF ADHESION;  Surgeon: Michael Boston, MD;  Location: [facility];  Service: General;  Laterality: N/A;   NO PAST SURGERIES     PARTIAL COLECTOMY N/A 09/10/2020   Procedure: TRANSVERSE COLECTOMY;  Surgeon: [name], MD;  Location: [facility] OR;  Service: General;  Laterality: N/A;   POLYPECTOMY  09/05/2020   Procedure: POLYPECTOMY;  Surgeon: Irene Shipper, MD;  Location: [facility];  Service: Endoscopy;;   PORTACATH PLACEMENT Right 10/01/2020   Procedure: INSERTION PORT-A-CATH;  Surgeon: [name], MD;  Location: [facility];  Service: General;  Laterality: Right;   SUBMUCOSAL TATTOO INJECTION  09/05/2020   Procedure: SUBMUCOSAL TATTOO INJECTION;  Surgeon: Irene Shipper, MD;  Location: [facility];  Service: Endoscopy;;   TRACHEOSTOMY TUBE PLACEMENT N/A 01/05/2021   Procedure: TRACHEOSTOMY;  Surgeon: [name], MD;  Location: [facility];  Service: ENT;  Laterality: N/A;   XI ROBOTIC ASSISTED COLOSTOMY TAKEDOWN N/A 07/01/2022   Procedure: ROBOTIC OSTOMY TAKEDOWN WITH INTRACORPOREAL ANASTOMOSIS WITH FIREFLY;  Surgeon: Michael Boston, MD;  Location: [facility];  Service: General;  Laterality: N/A;    Social History   Socioeconomic History   Marital status: Married    Spouse name: Mahalia   Number of children: 4   Years of education: Not on file   Highest education level: Not on file  Occupational History   Occupation: Disabled  Tobacco Use   Smoking status:  Former   Smokeless tobacco: Never   Tobacco comments:    quit smoking 35 yrs ago  Vaping Use   Vaping Use: Never used  Substance and Sexual Activity   Alcohol use: No   Drug use: No   Sexual activity: Not  on file  Other Topics Concern   Not on file  Social History Narrative   Married to his wife, Jerlyn Ly with total of #4 children (#2 with current wife). Lives in home with his son and daughter-in-law.     Social Determinants of Health   Financial Resource Strain: Not on file  Food Insecurity: No Food Insecurity (07/01/2022)   Hunger Vital Sign    Worried About Running Out of Food in the Last Year: Never true    Ran Out of Food in the Last Year: Never true  Transportation Needs: No Transportation Needs (07/01/2022)   PRAPARE - Hydrologist (Medical): No    Lack of Transportation (Non-Medical): No  Physical Activity: Not on file  Stress: Not on file  Social Connections: Not on file  Intimate Partner Violence: Not At Risk (07/01/2022)   Humiliation, Afraid, Rape, and Kick questionnaire    Fear of Current or Ex-Partner: No    Emotionally Abused: No    Physically Abused: No    Sexually Abused: No    Family History  Problem Relation Age of Onset   Breast cancer Mother    Breast cancer Maternal Aunt    Prostate cancer Maternal Uncle    Colon cancer Neg Hx    Esophageal cancer Neg Hx    Rectal cancer Neg Hx    Stomach cancer Neg Hx     Current Facility-Administered Medications  Medication Dose Route Frequency Provider Last Rate Last Admin   0.9 %  sodium chloride infusion   Intravenous Continuous Michael Boston, MD 10 mL/hr at 07/21/22 1800 Infusion Verify at 07/21/22 1800   0.9 %  sodium chloride infusion   Intravenous PRN Michael Boston, MD   Stopped at 07/11/22 0753   acetaminophen (TYLENOL) suppository 650 mg  650 mg Rectal Q6H PRN Michael Boston, MD       acetaminophen (TYLENOL) tablet 325-650 mg  325-650 mg Oral Q6H PRN Michael Boston, MD   325  mg at 07/25/22 0917   albuterol (PROVENTIL) (2.5 MG/3ML) 0.083% nebulizer solution 2.5 mg  2.5 mg Nebulization Q6H PRN Michael Boston, MD       allopurinol (ZYLOPRIM) tablet 100 mg  100 mg Oral Ardeen Fillers, MD   100 mg at 07/26/22 2102   alum & mag hydroxide-simeth (MAALOX/MYLANTA) 200-200-20 MG/5ML suspension 30 mL  30 mL Oral Q6H PRN Michael Boston, MD       amLODipine (NORVASC) tablet 10 mg  10 mg Oral q AM Michael Boston, MD   10 mg at 07/26/22 0802   atorvastatin (LIPITOR) tablet 20 mg  20 mg Oral Daily Michael Boston, MD   20 mg at 07/26/22 0857   bisacodyl (DULCOLAX) suppository 10 mg  10 mg Rectal Q12H PRN Michael Boston, MD       bisacodyl (DULCOLAX) suppository 10 mg  10 mg Rectal Daily Michael Boston, MD   10 mg at 07/26/22 [id number]   Chlorhexidine Gluconate Cloth 2 % PADS 6 each  6 each Topical Daily Michael Boston, MD   6 each at 07/25/22 2200   chlorproMAZINE (THORAZINE) injection 25 mg  25 mg Intramuscular TID PRN Michael Boston, MD   25 mg at 07/23/22 1646   diphenhydrAMINE (BENADRYL) 12.5 MG/5ML elixir 12.5 mg  12.5 mg Oral Q6H PRN Michael Boston, MD       Or   diphenhydrAMINE (BENADRYL) injection 12.5 mg  12.5 mg Intravenous Q6H PRN Michael Boston, MD   12.5 mg at  07/22/22 2200   enoxaparin (LOVENOX) injection 40 mg  40 mg Subcutaneous Q24H Michael Boston, MD   40 mg at 07/26/22 0801   feeding supplement (BOOST / RESOURCE BREEZE) liquid 1 Container  1 Container Oral TID BM Leighton Ruff, MD   1 Container at 07/26/22 2103   fentaNYL (SUBLIMAZE) injection 12.5-25 mcg  12.5-25 mcg Intravenous Q2H PRN Michael Boston, MD   25 mcg at 07/23/22 0426   gabapentin (NEURONTIN) capsule 100 mg  100 mg Oral Daily Michael Boston, MD   100 mg at 07/26/22 0802   gabapentin (NEURONTIN) capsule 200 mg  200 mg Oral Ardeen Fillers, MD   200 mg at 07/26/22 2101   heparin lock flush 100 unit/mL  500 Units Intracatheter Q30 days Michael Boston, MD       And   heparin lock flush 100 unit/mL  500 Units  Intracatheter PRN Michael Boston, MD   500 Units at 07/10/22 1744   hydrALAZINE (APRESOLINE) injection 10 mg  10 mg Intravenous Q4H PRN Michael Boston, MD       HYDROcodone-acetaminophen (NORCO/VICODIN) 5-325 MG per tablet 1-2 tablet  1-2 tablet Oral Q4H PRN Michael Boston, MD   2 tablet at 07/24/22 1317   insulin aspart (novoLOG) injection 0-15 Units  0-15 Units Subcutaneous TID AC & HS Eudelia Bunch, RPH   2 Units at 07/26/22 2300   levETIRAcetam (KEPPRA) tablet 500 mg  500 mg Oral BID Eudelia Bunch, RPH   500 mg at 07/26/22 1827   lip balm (CARMEX) ointment   Topical BID Michael Boston, MD   Given at 07/26/22 2104   LORazepam (ATIVAN) tablet 0.5 mg  0.5 mg Oral BID PRN Michael Boston, MD       magic mouthwash  15 mL Oral QID PRN Michael Boston, MD       methocarbamol (ROBAXIN) tablet 1,000 mg  1,000 mg Oral Q6H PRN Michael Boston, MD   1,000 mg at 07/26/22 2101   metoCLOPramide (REGLAN) tablet 10 mg  10 mg Oral TID AC & HS Michael Boston, MD   10 mg at 07/26/22 2102   metoprolol tartrate (LOPRESSOR) injection 5 mg  5 mg Intravenous Q6H PRN Debbe Odea, MD       multivitamin with minerals tablet 1 tablet  1 tablet Oral Daily Eudelia Bunch, RPH   1 tablet at 07/26/22 0856   ondansetron (ZOFRAN) injection 4 mg  4 mg Intravenous Q6H PRN Michael Boston, MD   4 mg at 07/24/22 0930   Oral care mouth rinse  15 mL Mouth Rinse PRN Kendry Pfarr, Remo Lipps, MD       pantoprazole (PROTONIX) EC tablet 40 mg  40 mg Oral Daily Michael Boston, MD   40 mg at 07/26/22 0801   phenol (CHLORASEPTIC) mouth spray 2 spray  2 spray Mouth/Throat PRN Michael Boston, MD   2 spray at 07/05/22 1037   polyethylene glycol (MIRALAX / GLYCOLAX) packet 17 g  17 g Oral Q12H PRN Michael Boston, MD       polyethylene glycol (MIRALAX / GLYCOLAX) packet 17 g  17 g Oral Daily Michael Boston, MD   17 g at 07/26/22 0858   potassium chloride 10 mEq in 100 mL IVPB  10 mEq Intravenous Q1 Hr x 4 Shaunak Kreis, Remo Lipps, MD       potassium chloride SA (KLOR-CON M)  CR tablet 40 mEq  40 mEq Oral BID Michael Boston, MD       senna (SENOKOT) tablet 8.6  mg  1 tablet Oral Daily Michael Boston, MD       simethicone [facility]) chewable tablet 80 mg  80 mg Oral QID PRN Michael Boston, MD       sodium chloride flush (NS) 0.9 % injection 10-40 mL  10-40 mL Intracatheter Gorden Harms, MD   10 mL at 07/23/22 2209   sodium chloride flush (NS) 0.9 % injection 10-40 mL  10-40 mL Intracatheter PRN Michael Boston, MD   10 mL at 07/10/22 1743     Allergies  Allergen Reactions   Morphine And Related Other (See Comments)    Throat swelling (can tolerate oxycodone/hydrocodone/hydromorphone)    Signed: Adin Hector, MD, FACS, MASCRS Esophageal, Gastrointestinal & Colorectal Surgery Robotic and Minimally Invasive Surgery  [facility] [place] [id number], [place],  [address] [phone] Fax [phone] Main  CONTACT INFORMATION:  Weekday (9AM-5PM): Call [facility] main office at [phone]  Weeknight (5PM-9AM) or Weekend/Holiday: Check [email or web address] (password " [email or web address]") for General Surgery [facility] coverage  (Please, do not use SecureChat as it is not reliable communication to reach operating surgeons for immediate patient care given surgeries/outpatient duties/clinic/cross-coverage/off post-call which would lead to a delay in care.  Epic staff messaging available for outptient concerns, but may not be answered for 48 hours or more).        07/27/2022

## 2022-07-16 NOTE — Progress Notes (Signed)
   Echocardiogram 2D Echocardiogram has been performed.  Thomas [name] 07/16/2022, 8:40 AM

## 2022-07-16 NOTE — Progress Notes (Signed)
PHARMACY - TOTAL PARENTERAL NUTRITION CONSULT NOTE   Indication: Prolonged ileus  Patient Measurements: Height: '5\' 8"'$  (172.7 cm) Weight: 90 kg (198 lb 6.6 oz) IBW/kg (Calculated) : 68.4 TPN AdjBW (KG): 73.1 Body mass index is 30.17 kg/m. Usual Weight:   Assessment: 67 yo male with hx colon cancer diagnosed 2022 s/p transverse colectomy admitted for elective robotic ostomy takedown with lysis of adhesions, incarcerated incisional hernia repair on 07/01/22. Post-op course complicated by right lower lobe collapse and hypoxemia. Pharmacy consulted for TPN on 2/29 d/t prolonged ileus.  Glucose / Insulin: Hx DM2 but A1c well controlled at 5.1 as of 2/22 - CBGs consistently < 180 w/ minimal SSI use (8 units yesterday) Electrolytes: Na remains borderline low but stable; others stable WNL Renal: CKD3 (baseline SCr ~1.5) - SCr stable at baseline; BUN elevated but trending down; UOP remains adequate Hepatic: AST/ALT bumped to > 300 overnight; previously had been mildly elevated but stable. Alk phos now elevated to lesser extent - TG, T.bili stable WNL; Alb remains low-stable Intake / Output: NG out; last charted BM 3/6 - appears to be +4L this admit (per charting) - MIVF: NS @ [facility] GI Imaging: - 2/27 AXR: improving postop pneumoperitoneum - 2/29 AXR: possible ileus  - 3/1 CT a/p: difficult to r/o anastomotic leak d/t contrast not reaching anastomosis, but no signs of abscess GI Surgeries / Procedures:  - 2/22: robotic colostomy takedown and lysis of adhesions  Central access: implanted port. Double lumen PICC placed 3/2 TPN start date: 2/29  Nutritional Goals: Goal TPN rate is 100 mL/hr (provides 130 g of protein, 360 g dextrose, and 2486 kcals per day)  RD Assessment: pending Estimated Needs Total Energy Estimated Needs: 2400-2600 kcals Total Protein Estimated Needs: 125-140 grams Total Fluid Estimated Needs: >/= 2.4L  Current Nutrition:  Regular diet 48-hr calorie count < 50% of  calorie goals Boost/Breeze TID per RD - refused 2 of 3 yesterday TPN at 1/2 rate  Plan:  At 1800: Continue TPN at goal rate, 100 mL/hr at 1800, per surgery, patient needs to be eating >50% for TPN to start weaning down  Electrolytes in TPN: no changes Na 50 mEq/L  K 15 mEq/L Ca 0 mEq/L Mg 4 mEq/L Phos 10 mmol/L Cl:Ac 1:1 Continue enteral MVI as he is tolerating all PO meds.  Stop SSI/CBG checks w/ well-controlled glucose mIVF at [facility]. Further management by MD Monitor TPN labs on Mon/Thurs CMP tomorrow AM (for LFTs)  Reuel Boom, PharmD, BCPS [phone] 07/16/2022, 9:02 AM

## 2022-07-16 NOTE — Progress Notes (Signed)
Thomas [name] [id number] 28-Oct-1955  CARE TEAM:  PCP: [name], NP  Outpatient Care Team: Patient Care Team: [name], NP as PCP - General Benito Mccreedy, MD (Internal Medicine) Patient, No Pcp Per (General Practice) Michael Boston, MD as Consulting Physician (Colon and Rectal Surgery) Irene Shipper, MD as Consulting Physician (Gastroenterology) Ladell Pier, MD as Consulting Physician (Oncology)  Inpatient Treatment Team: Treatment Team: Attending Provider: Michael Boston, MD; Consulting Physician: Alma Friendly, MD; Rounding Team: Fatima Blank, MD; Speech Language Pathologist: Fritz Pickerel; Physical Therapy Assistant: Diego Cory, PTA; Charge Nurse: Steward Ros, RN; Pharmacist: Polly Cobia, [facility]; Occupational Therapist: Julien Girt, OT; Utilization Review: Antionette Char, RN   Problem List:   Principal Problem:   Colon cancer 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   History of ischemic left MCA stroke 2015   Type 2 diabetes mellitus with diabetic nephropathy, without long-term current use of insulin ([place])   Hypertension   Protein-calorie malnutrition, moderate ([place])   Epilepsy ([place])   Hyperlipidemia   Delirium   Hypokalemia   Incisional hernia, without obstruction or gangrene   CKD (chronic kidney disease) stage 3b   Hypophosphatemia   Normocytic anemia   Atelectasis   15 Days Post-Op  07/01/2022  POST-OPERATIVE DIAGNOSIS:   COLOSTOMY FOR COLON RESECTION INCARCERATED INCISIONAL HERNIA   PROCEDURE:   TAKEDOWN OF END COLOSTOMY WITH ANASTOMOSIS INTRAOPERATIVE ASSESSMENT OF TISSUE VASCULAR PERFUSION USING ICG (indocyanine green) IMMUNOFLUORESCENCE TRANSVERSUS ABDOMINIS PLANE (TAP) BLOCK - BILATERAL LYSIS OF ADHESIONS x90 MINUTES (50% OF CASE) PRIMARY REPAIR OF INCARCERATED INCISIONAL HERNIA   SURGEON:  Adin Hector, MD  OR FINDINGS:     Patient had very dense intra-abdominal adhesions of small bowel to all quadrants of the abdomen as well as colon.  He had a loop of small bowel incarcerated in a periumbilical midline incisional hernia 4 x 4 cm.  Reduced and primarily repaired.   No obvious metastatic disease on visceral parietal peritoneum or liver.   It is an isoperistaltic ileocolonic anastomosis (proximal transverse colon to distal transverse colon) that rests in the epigastric region.    Assessment  Deconditioned but improving  [facility] Stay = 15 days)  Plan:  Nausea and vomiting x 1 yesterday but feeling better.  Will place on scheduled metoclopramide.  See if regular diet can be tolerated.  Recurrent, switch back to dysphagia 3 diet.  He would like to keep regular diet.  He refuses a feeding tube.  Keep at 50% TPN through the weekend.   If eating better and nutritional numbers better, wean off TPN and perhaps discharge to skilled nurse facility on Monday.  Very high risk of readmission rate  Post ileus diarrhea resolved without any evidence of infection.  Continue fiber bowel regimen.  CT scan shows no abscess undrained fluid collection or delayed leak.  Lungs improved.  Stop antibiotics and follow.  Some leukocytosis but no evidence of any active infection and no clinical decline.  ICU delirium with sundowning seems to be resolved.  Medicine help with regimen.  Follow electrolytes closely. He has had issues with hypokalemia & hypophosphatemia.  Stable for now.  Chronic kidney disease but nonoliguric.  Try and keep on the dry side as possible.  Pulmonary status seems to be stable.   Nebulizers as needed.  Medicine following.  Some acute blood loss anemia in setting of chronic anemia of chronic disease and iron deficiency anemia.  Hgb stable.  Not hypotensive.  Follow.  -VTE prophylaxis- SCDs, etc  -mobilize as tolerated to help recovery.  Rather deconditioned but was able to mobilize in a with nursing and  therapy.  Starting to walk in the hallways more regularly.  Guardedly hopeful sign.  I agree he would benefit from rehab at a skilled facility with more aggressive therapies.  Plans to go to skilled nursing facility.  Hopefully if he has adequate oral intake and is doing better could leave may be as soon as tomorrow.  We will see.     Disposition:  The patient is from: Home  Anticipate discharge to:  [facility] (SNF)  Anticipated Date of Discharge is:  March 11,2024    Barriers to discharge:  Pending Clinical improvement (more likely than not)  Patient currently is NOT MEDICALLY STABLE for discharge from the hospital from a surgery standpoint.   I updated the patient's status to the patient and nurse at bedside & ICU RNs just outside room.  Recommendations were made.  Questions were answered.  They expressed understanding & appreciation.    I reviewed nursing notes, last 24 h vitals and pain scores, last 48 h intake and output, last 24 h labs and trends, and last 24 h imaging results.  Reviewed notes with respiratory physical therapies as well as critical care nursing.  I have reviewed this patient's available data, including medical history, events of note, test results, etc as part of my evaluation.  A significant portion of that time was spent in counseling.  Care during the described time interval was provided by me.  This care required moderate level of medical decision making.  07/16/2022    Subjective: (Chief complaint) Patient with not much appetite.  Eating a few bites.  Questionable emesis versus spitting up yesterday.  Feels a little better.  Mobilizing more.  Objective:  Vital signs:  Vitals:   07/15/22 1252 07/15/22 2154 07/16/22 0443 07/16/22 0446  BP: 138/62 131/63 138/72   Pulse: 82 83 81   Resp: '20 18 20   '$ Temp: 98.8 F (37.1 C) 97.8 F (36.6 C) 98.4 F (36.9 C)   TempSrc: Oral Oral Oral   SpO2: 97% 98% 97%   Weight:    90 kg  Height:         Last BM Date : 07/14/22  Intake/Output   Yesterday:  03/07 0701 - 03/08 0700 In: 1001.2 [P.O.:330; I.V.:671.2] Out: 2450 [Urine:2450] This shift:  No intake/output data recorded.  Bowel function:  Flatus: YES  BM:  YES  Drain: (No drain)   Physical Exam:  General: Pt awake and alert.  no acute distress.  Calm & engaging in conversation.  No hiccuping or catching. Eyes: PERRL, normal EOM.  Sclera clear.  No icterus Neuro: CN II-XII intact w/o focal sensory/motor deficits. Lymph: No head/neck/groin lymphadenopathy Psych:  No delerium/psychosis/paranoia.  Oriented x 4.  No confusion or anxiety today. HENT: Normocephalic, Mucus membranes moist.  No thrush Neck: Supple, No tracheal deviation.  No obvious thyromegaly Chest: No pain to chest wall compression.  Good respiratory excursion.  No audible wheezing CV:  Pulses intact.  Regular rhythm.  No major extremity edema MS: Normal AROM mjr joints.  No obvious deformity  Abdomen: Obese but  Soft.  Mildy distended.  Nontender .  Right upper quadrant old colostomy wound closed and dry.  No purulence or cellulitis.  No guarding nor evidence of peritonitis.  No incarcerated hernias.  Rectal: Flexi-Seal in place  with thin gray/brown effluent Ext:   No deformity.  No mjr edema.  No cyanosis Skin: No petechiae / purpurea.  No major sores.  Warm and dry    Results:   Cultures: Recent Results (from the past 720 hour(s))  MRSA Next Gen by PCR, Nasal     Status: None   Collection Time: 07/03/22  3:16 PM   Specimen: Nasal Mucosa; Nasal Swab  Result Value Ref Range Status   MRSA by PCR Next Gen NOT DETECTED NOT DETECTED Final    Comment: (NOTE) The GeneXpert MRSA Assay (FDA approved for NASAL specimens only), is one component of a comprehensive MRSA colonization surveillance program. It is not intended to diagnose MRSA infection nor to guide or monitor treatment for MRSA infections. Test performance is not FDA approved in  patients less than 26 years old. Performed at [facility], [place] [address] [place], [place] [address]   Culture, blood (Routine X 2) w Reflex to ID Panel     Status: None   Collection Time: 07/11/22  9:57 AM   Specimen: BLOOD  Result Value Ref Range Status   Specimen Description   Final    BLOOD BLOOD LEFT ARM Performed at [facility] [address] [place], [place] [address]    Special Requests   Final    BOTTLES DRAWN AEROBIC AND ANAEROBIC Blood Culture adequate volume Performed at [facility] [address] [place], [place] [address]    Culture   Final    NO GROWTH 5 DAYS Performed at [facility], [place] [address], [place], [place] [address]    Report Status 07/16/2022 FINAL  Final  Culture, blood (Routine X 2) w Reflex to ID Panel     Status: None   Collection Time: 07/11/22  9:57 AM   Specimen: BLOOD  Result Value Ref Range Status   Specimen Description   Final    BLOOD BLOOD LEFT HAND Performed at [facility] [address] [place], [place] [address]    Special Requests   Final    BOTTLES DRAWN AEROBIC AND ANAEROBIC Blood Culture adequate volume Performed at [facility] [address], [place], [place] [address]    Culture   Final    NO GROWTH 5 DAYS Performed at [facility], [place] [address], [place], [place] [address]    Report Status 07/16/2022 FINAL  Final  C Difficile Quick Screen (NO PCR Reflex)     Status: None   Collection Time: 07/12/22  8:24 AM   Specimen: STOOL  Result Value Ref Range Status   C Diff antigen NEGATIVE NEGATIVE Final   C Diff toxin NEGATIVE NEGATIVE Final   C Diff interpretation No C. difficile detected.  Final    Comment: Performed at [facility], [place] [address], [place],  [address]  Gastrointestinal Panel by PCR , Stool     Status: None   Collection Time: 07/12/22  8:24 AM   Specimen: STOOL   Result Value Ref Range Status   Campylobacter species NOT DETECTED NOT DETECTED Final   Plesimonas shigelloides NOT DETECTED NOT DETECTED Final   Salmonella species NOT DETECTED NOT DETECTED Final   Yersinia enterocolitica NOT DETECTED NOT DETECTED Final   Vibrio species NOT DETECTED NOT DETECTED Final   Vibrio cholerae NOT DETECTED NOT DETECTED Final   Enteroaggregative E coli (EAEC) NOT DETECTED NOT DETECTED Final   Enteropathogenic E coli (EPEC)  NOT DETECTED NOT DETECTED Final   Enterotoxigenic E coli (ETEC) NOT DETECTED NOT DETECTED Final   Shiga like toxin producing E coli (STEC) NOT DETECTED NOT DETECTED Final   Shigella/Enteroinvasive E coli (EIEC) NOT DETECTED NOT DETECTED Final   Cryptosporidium NOT DETECTED NOT DETECTED Final   Cyclospora cayetanensis NOT DETECTED NOT DETECTED Final   Entamoeba histolytica NOT DETECTED NOT DETECTED Final   Giardia lamblia NOT DETECTED NOT DETECTED Final   Adenovirus F40/41 NOT DETECTED NOT DETECTED Final   Astrovirus NOT DETECTED NOT DETECTED Final   Norovirus GI/GII NOT DETECTED NOT DETECTED Final   Rotavirus A NOT DETECTED NOT DETECTED Final   Sapovirus (I, II, IV, and V) NOT DETECTED NOT DETECTED Final    Comment: Performed at [facility], [place] [place], [place] [address]    Labs: Results for orders placed or performed during the hospital encounter of 07/01/22 (from the past 48 hour(s))  Glucose, capillary     Status: Abnormal   Collection Time: 07/14/22 11:34 AM  Result Value Ref Range   Glucose-Capillary 149 (H) 70 - 99 mg/dL    Comment: Glucose reference range applies only to samples taken after fasting for at least 8 hours.  Glucose, capillary     Status: Abnormal   Collection Time: 07/14/22  5:39 PM  Result Value Ref Range   Glucose-Capillary 103 (H) 70 - 99 mg/dL    Comment: Glucose reference range applies only to samples taken after fasting for at least 8 hours.  Glucose, capillary     Status: Abnormal    Collection Time: 07/15/22 12:10 AM  Result Value Ref Range   Glucose-Capillary 180 (H) 70 - 99 mg/dL    Comment: Glucose reference range applies only to samples taken after fasting for at least 8 hours.  Comprehensive metabolic panel     Status: Abnormal   Collection Time: 07/15/22  2:50 AM  Result Value Ref Range   Sodium 134 (L) 135 - 145 mmol/L   Potassium 4.3 3.5 - 5.1 mmol/L   Chloride 105 98 - 111 mmol/L   CO2 24 22 - 32 mmol/L   Glucose, Bld 159 (H) 70 - 99 mg/dL    Comment: Glucose reference range applies only to samples taken after fasting for at least 8 hours.   BUN 41 (H) 8 - 23 mg/dL   Creatinine, Ser 1.55 (H) 0.61 - 1.24 mg/dL   Calcium 7.8 (L) 8.9 - 10.3 mg/dL   Total Protein 7.1 6.5 - 8.1 g/dL   Albumin 2.4 (L) 3.5 - 5.0 g/dL   AST 70 (H) 15 - 41 U/L   ALT 112 (H) 0 - 44 U/L   Alkaline Phosphatase 87 38 - 126 U/L   Total Bilirubin 0.5 0.3 - 1.2 mg/dL   GFR, Estimated 49 (L) >60 mL/min    Comment: (NOTE) Calculated using the CKD-EPI Creatinine Equation (2021)    Anion gap 5 5 - 15    Comment: Performed at [facility], [place] [address], [place], [place] [address]  Magnesium     Status: None   Collection Time: 07/15/22  2:50 AM  Result Value Ref Range   Magnesium 1.8 1.7 - 2.4 mg/dL    Comment: Performed at [facility], [place] [address] [place], [place] [address]  Phosphorus     Status: None   Collection Time: 07/15/22  2:50 AM  Result Value Ref Range   Phosphorus 4.2 2.5 - 4.6 mg/dL  Comment: Performed at [facility], [place] [address], [place], [place] [address]  Triglycerides     Status: None   Collection Time: 07/15/22  2:50 AM  Result Value Ref Range   Triglycerides 87 <150 mg/dL    Comment: Performed at [facility], [place] [address] [place],  [place] [address]  CBC with Differential/Platelet     Status: Abnormal   Collection Time: 07/15/22  2:50 AM  Result Value Ref Range   WBC  18.2 (H) 4.0 - 10.5 K/uL   RBC 3.09 (L) 4.22 - 5.81 MIL/uL   Hemoglobin 8.0 (L) 13.0 - 17.0 g/dL    Comment: Reticulocyte Hemoglobin testing may be clinically indicated, consider ordering this additional test [id number]    HCT 23.7 (L) 39.0 - 52.0 %   MCV 76.7 (L) 80.0 - 100.0 fL   MCH 25.9 (L) 26.0 - 34.0 pg   MCHC 33.8 30.0 - 36.0 g/dL   RDW 15.5 11.5 - 15.5 %   Platelets 550 (H) 150 - 400 K/uL   nRBC 0.0 0.0 - 0.2 %   Neutrophils Relative % 77 %   Neutro Abs 14.3 (H) 1.7 - 7.7 K/uL   Lymphocytes Relative 8 %   Lymphs Abs 1.4 0.7 - 4.0 K/uL   Monocytes Relative 11 %   Monocytes Absolute 1.9 (H) 0.1 - 1.0 K/uL   Eosinophils Relative 2 %   Eosinophils Absolute 0.3 0.0 - 0.5 K/uL   Basophils Relative 1 %   Basophils Absolute 0.1 0.0 - 0.1 K/uL   Immature Granulocytes 1 %   Abs Immature Granulocytes 0.18 (H) 0.00 - 0.07 K/uL    Comment: Performed at [facility], [place] [address] [place], [place] [address]  Glucose, capillary     Status: Abnormal   Collection Time: 07/15/22  6:32 AM  Result Value Ref Range   Glucose-Capillary 156 (H) 70 - 99 mg/dL    Comment: Glucose reference range applies only to samples taken after fasting for at least 8 hours.  Glucose, capillary     Status: Abnormal   Collection Time: 07/15/22  7:47 AM  Result Value Ref Range   Glucose-Capillary 150 (H) 70 - 99 mg/dL    Comment: Glucose reference range applies only to samples taken after fasting for at least 8 hours.  Glucose, capillary     Status: Abnormal   Collection Time: 07/15/22 11:32 AM  Result Value Ref Range   Glucose-Capillary 152 (H) 70 - 99 mg/dL    Comment: Glucose reference range applies only to samples taken after fasting for at least 8 hours.  Glucose, capillary     Status: Abnormal   Collection Time: 07/15/22  4:22 PM  Result Value Ref Range   Glucose-Capillary 169 (H) 70 - 99 mg/dL    Comment: Glucose reference range applies only to samples taken after fasting for at  least 8 hours.  Prealbumin     Status: None   Collection Time: 07/15/22  6:19 PM  Result Value Ref Range   Prealbumin 23 18 - 38 mg/dL    Comment: Performed at [facility] [address], [place], [place] [address]  Glucose, capillary     Status: Abnormal   Collection Time: 07/15/22  9:52 PM  Result Value Ref Range   Glucose-Capillary 141 (H) 70 - 99 mg/dL    Comment: Glucose reference range applies only to samples taken after fasting for at least 8 hours.  Comprehensive metabolic panel  Status: Abnormal   Collection Time: 07/16/22  2:48 AM  Result Value Ref Range   Sodium 134 (L) 135 - 145 mmol/L   Potassium 4.5 3.5 - 5.1 mmol/L   Chloride 103 98 - 111 mmol/L   CO2 23 22 - 32 mmol/L   Glucose, Bld 142 (H) 70 - 99 mg/dL    Comment: Glucose reference range applies only to samples taken after fasting for at least 8 hours.   BUN 37 (H) 8 - 23 mg/dL   Creatinine, Ser 1.45 (H) 0.61 - 1.24 mg/dL   Calcium 8.2 (L) 8.9 - 10.3 mg/dL   Total Protein 7.2 6.5 - 8.1 g/dL   Albumin 2.5 (L) 3.5 - 5.0 g/dL   AST 305 (H) 15 - 41 U/L   ALT 311 (H) 0 - 44 U/L   Alkaline Phosphatase 146 (H) 38 - 126 U/L   Total Bilirubin 0.7 0.3 - 1.2 mg/dL   GFR, Estimated 53 (L) >60 mL/min    Comment: (NOTE) Calculated using the CKD-EPI Creatinine Equation (2021)    Anion gap 8 5 - 15    Comment: Performed at [facility], [place] [address] [place], [place] [address]  Magnesium     Status: None   Collection Time: 07/16/22  2:48 AM  Result Value Ref Range   Magnesium 2.2 1.7 - 2.4 mg/dL    Comment: Performed at [facility], [place] [address] [place], [place] [address]  Phosphorus     Status: None   Collection Time: 07/16/22  2:48 AM  Result Value Ref Range   Phosphorus 3.7 2.5 - 4.6 mg/dL    Comment: Performed at [facility], [place] [address], [place], [place] [address]  Glucose, capillary     Status: Abnormal   Collection Time: 07/16/22  7:21  AM  Result Value Ref Range   Glucose-Capillary 132 (H) 70 - 99 mg/dL    Comment: Glucose reference range applies only to samples taken after fasting for at least 8 hours.    Imaging / Studies: No results found.  Medications / Allergies: per chart  Antibiotics: Anti-infectives (From admission, onward)    Start     Dose/Rate Route Frequency Ordered Stop   07/10/22 0400  piperacillin-tazobactam (ZOSYN) IVPB 3.375 g  Status:  Discontinued        3.375 g 12.5 mL/hr over 240 Minutes Intravenous Every 8 hours 07/09/22 2040 07/10/22 0738   07/08/22 1100  piperacillin-tazobactam (ZOSYN) IVPB 3.375 g  Status:  Discontinued        3.375 g 12.5 mL/hr over 240 Minutes Intravenous Every 8 hours 07/08/22 1001 07/09/22 2040   07/08/22 1000  erythromycin 250 mg in sodium chloride 0.9 % 100 mL IVPB  Status:  Discontinued        250 mg 100 mL/hr over 60 Minutes Intravenous Every 12 hours 07/08/22 0739 07/09/22 0730   07/06/22 0915  erythromycin 250 mg in sodium chloride 0.9 % 100 mL IVPB  Status:  Discontinued        250 mg 100 mL/hr over 60 Minutes Intravenous Every 8 hours 07/06/22 0823 07/08/22 0739   07/03/22 1700  piperacillin-tazobactam (ZOSYN) IVPB 3.375 g        3.375 g 12.5 mL/hr over 240 Minutes Intravenous Every 8 hours 07/03/22 1612 07/08/22 0450   07/02/22 0000  cefoTEtan (CEFOTAN) 2 g in sodium chloride 0.9 % 100 mL IVPB        2 g 200 mL/hr over  30 Minutes Intravenous Every 12 hours 07/01/22 1737 07/02/22 0051   07/01/22 1400  neomycin (MYCIFRADIN) tablet 1,000 mg  Status:  Discontinued       See Hyperspace for full Linked Orders Report.   1,000 mg Oral 3 times per day 07/01/22 1054 07/01/22 1109   07/01/22 1400  metroNIDAZOLE (FLAGYL) tablet 1,000 mg  Status:  Discontinued       See Hyperspace for full Linked Orders Report.   1,000 mg Oral 3 times per day 07/01/22 1054 07/01/22 1109   07/01/22 1100  cefoTEtan (CEFOTAN) 2 g in sodium chloride 0.9 % 100 mL IVPB        2 g 200  mL/hr over 30 Minutes Intravenous On call to O.R. 07/01/22 1054 07/01/22 1204         Note: Portions of this report may have been transcribed using voice recognition software. Every effort was made to ensure accuracy; however, inadvertent computerized transcription errors may be present.   Any transcriptional errors that result from this process are unintentional.    Adin Hector, MD, FACS, MASCRS Esophageal, Gastrointestinal & Colorectal Surgery Robotic and Minimally Invasive Surgery  [facility]. [address] [place], [place] [address] [phone] Fax [phone] Main  CONTACT INFORMATION:  Weekday (9AM-5PM): Call [facility] main office at [phone]  Weeknight (5PM-9AM) or Weekend/Holiday: Check [email or web address] (password " [email or web address]") for General Surgery [facility] coverage  (Please, do not use SecureChat as it is not reliable communication to reach operating surgeons for immediate patient care given surgeries/outpatient duties/clinic/cross-coverage/off post-call which would lead to a delay in care.  Epic staff messaging available for outptient concerns, but may not be answered for 48 hours or more).     07/16/2022  7:55 AM

## 2022-07-16 NOTE — Progress Notes (Signed)
  Progress Note   Patient: Thomas [name] EVO:[id number] DOB: April 22, 1956 DOA: 07/01/2022     15 DOS: the patient was seen and examined on 07/16/2022 at 9:15AM      Brief hospital course: Mr. Claytor is a 67 y.o. M with DM, HTN, colon CA, hx stroke, seizures, meningioma who presented for electrive ostomy takedown.  Post-operative course complicated by delirium and hypoxia from atelectasis/mucus plugging/aspiration, medicine consulted.        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  Prior echo did not characterize aortic valve is bicuspid - Follow-up with PCP  Atelectasis Resolved - Continue pulmonary toilet  Transaminitis Worse today.  Still suspect this is from TPN and does not require imaging yet - Monitor emesis - Trend LFTs over weekend  CKD (chronic kidney disease) stage 3b Stable  Epilepsy ([place]) No seizures here - Continue Kppera  History of ischemic left MCA stroke 2015 - Continue Crestor  Hypertension BP normal - Continue amlodipine  Type 2 diabetes mellitus with diabetic nephropathy, without long-term current use of insulin (H[facility]) A1c <6, glucoses good here  - Continue SS corrections  Obesity BMI 30.1         Subjective: No changes.  Some emesis overnight. Surgery working to wean TPN     Physical Exam: BP (!) 114/56 (BP Location: Left Arm)   Pulse 92   Temp 98.9 F (37.2 C) (Oral)   Resp 19   Ht 5\' 8"  (1.727 m)   Wt 90 kg   SpO2 97%   BMI 30.17 kg/m   adult male, lying in bed, interactive and appropriate RRR, still loud systolic murmur, no peripheral edema Respiratory rate normal, lungs clear without rales or  wheezes Abdomen with some mild right-sided abdominal tenderness, no rebound, no rigidity Attention normal, affect appropriate, judgment insight appear normal    Data Reviewed: LFTs up to 300s, otherwise CMP is unremarkable  Family Communication: Family member at the bedside    Disposition: Status is: Inpatient         Author: Edwin Dada, MD 07/16/2022 11:48 AM  For on call review [email or web address].

## 2022-07-16 NOTE — Progress Notes (Signed)
Occupational Therapy Treatment Patient Details Name: Thomas [name] MRN: [id number] DOB: August 29, 1955 Today's Date: 07/16/2022   History of present illness Patient is a 67 yo male admitted 07/01/22  S/P LYSIS OF ADHESIONS , PRIMARY REPAIR OF INCARCERATED INCISIONAL HERNIA , TAKEDOWN OF END COLOSTOMY WITH ANASTOMOSIS.2/24 moved to stepdown unit for hypoxemia, right lower lobe collapse CT chest showed debris in right lower lobe with atelectasis. Now with post op ileus, asp pneumonitis. PMH, colon and rectal  cancer, colostomy, VDRF with trach 2022,   DVT, afib, DM, HTN, seizure, CVA, fromtal meningioma, lobectomy   OT comments  Patient progressing and showed improved ability to balance while standing at sink and using BUE for functional grooming tasks, as well as increased tolerance for standing ADLs, compared to previous session. Pt able to stand from recliner with just Min guard and now able to perform modified figure 4 to BLEs for LE ADLs but leans back to compensate for abdominal pain, and will still require assistance which pt reports is available at home.  Patient remains limited by pain to surgical site, generalized weakness and decreased activity tolerance along with deficits noted below. Pt continues to demonstrate good rehab potential and would benefit from continued skilled OT to increase safety and independence with ADLs and functional transfers to allow pt to return home safely and reduce caregiver burden and fall risk.    Recommendations for follow up therapy are one component of a multi-disciplinary discharge planning process, led by the attending physician.  Recommendations may be updated based on patient status, additional functional criteria and insurance authorization.    Follow Up Recommendations  Skilled nursing-short term rehab (<3 hours/day)     Assistance Recommended at Discharge Frequent or constant Supervision/Assistance  Patient can return home with the following  A lot of help  with bathing/dressing/bathroom;Assistance with cooking/housework;Direct supervision/assist for medications management;Assist for transportation;Direct supervision/assist for financial management;Help with stairs or ramp for entrance;A little help with walking and/or transfers   Equipment Recommendations  BSC/3in1    Recommendations for Other Services      Precautions / Restrictions Precautions Precautions: Fall Precaution Comments: abdominal Restrictions Weight Bearing Restrictions: No       Mobility Bed Mobility               General bed mobility comments: Pt received in recliner.    Transfers                         Balance   Sitting-balance support: Bilateral upper extremity supported, Feet supported Sitting balance-Leahy Scale: Fair     Standing balance support: During functional activity, Single extremity supported Standing balance-Leahy Scale: Fair (static at sink without UE support)                             ADL either performed or assessed with clinical judgement   ADL Overall ADL's : Needs assistance/impaired Eating/Feeding: Modified independent;Bed level Eating/Feeding Details (indicate cue type and reason): Pt observed eating breakfast earlier in day without need of assistance. Grooming: Standing;Oral care;Wash/dry face;Supervision/safety               Lower Body Dressing: Sitting/lateral leans;Minimal assistance;Maximal assistance Lower Body Dressing Details (indicate cue type and reason): Re: LB bathing and LB dressing: At recliner level pt able to demonstrate modified figure 4 position on each LE, but had to lean back in recliner to compensate for pain, therefore still requires  Max As for socks. Pt has had long handled adaptive equipment training by therapy in the past and stated, "It's not for me." Pt educated on compensatory methods using figure 4 and pt also endorsed help available from family at home.  Standing LE ADLs  with Min As. Toilet Transfer: Agricultural engineer (2 wheels) Toilet Transfer Details (indicate cue type and reason): Pt stood from recliner to [place] with Min gaurd assist. Correct placement of hands. Pt ambulated ~5' to sink with RW and 5' back to recliner with Min gaurd assist.   Toileting - Clothing Manipulation Details (indicate cue type and reason): Pt on external catheter     Functional mobility during ADLs: Min guard;Rolling walker (2 wheels)      Extremity/Trunk Assessment Upper Extremity Assessment Upper Extremity Assessment: Overall WFL for tasks assessed   Lower Extremity Assessment Lower Extremity Assessment: Defer to PT evaluation   Cervical / Trunk Assessment Cervical / Trunk Assessment: Normal Cervical / Trunk Exceptions: abdominal surgery    Vision Ability to See in Adequate Light: 2 Moderately impaired Additional Comments: Pt reports cataracts in one eye and decreased vision in other eye. Needed all lights in room on to work with grooming supplies without assistance.   Perception     Praxis      Cognition Arousal/Alertness: Awake/alert Behavior During Therapy: WFL for tasks assessed/performed Overall Cognitive Status: Within Functional Limits for tasks assessed                                 General Comments: AxO x 3 pleasant        Exercises      Shoulder Instructions       General Comments      Pertinent Vitals/ Pain       Pain Assessment Pain Assessment: PAINAD Faces Pain Scale: Hurts even more Breathing: normal Negative Vocalization: occasional moan/groan, low speech, negative/disapproving quality Facial Expression: sad, frightened, frown Body Language: tense, distressed pacing, fidgeting Consolability: distracted or reassured by voice/touch PAINAD Score: 4 Facial Expression: Grimacing Body Movements: Protection Muscle Tension: Tense, rigid Pain Location: abdomen, "comes and goes" but worse with OT visit due to  hiccups. Pain Descriptors / Indicators: Discomfort, Grimacing, Sore, Operative site guarding Pain Intervention(s): Limited activity within patient's tolerance, Monitored during session, Repositioned (Pillow to abdomin for gentle pressure.)  Home Living                                          Prior Functioning/Environment              Frequency  Min 2X/week        Progress Toward Goals  OT Goals(current goals can now be found in the care plan section)  Progress towards OT goals: Progressing toward goals  Acute Rehab OT Goals Patient Stated Goal: "Get back to where I was before this ostomy take down". OT Goal Formulation: With patient Time For Goal Achievement: 07/20/22 Potential to Achieve Goals: Good  Plan Discharge plan remains appropriate    Co-evaluation                 AM-PAC OT "6 Clicks" Daily Activity     Outcome Measure   Help from another person eating meals?: None Help from another person taking care of personal grooming?: A Little Help from another person  toileting, which includes using toliet, bedpan, or urinal?: A Lot Help from another person bathing (including washing, rinsing, drying)?: A Lot Help from another person to put on and taking off regular upper body clothing?: A Little Help from another person to put on and taking off regular lower body clothing?: A Lot 6 Click Score: 16    End of Session Equipment Utilized During Treatment: Rolling walker (2 wheels);Gait belt  OT Visit Diagnosis: Unsteadiness on feet (R26.81);Other abnormalities of gait and mobility (R26.89);Muscle weakness (generalized) (M62.81);Pain   Activity Tolerance Patient limited by pain   Patient Left in chair;with call bell/phone within reach;with chair alarm set   Nurse Communication Other (comment) (External catheter bothering him)        Time: [id number] OT Time Calculation (min): 30 min  Charges: OT General Charges $OT Visit: 1 Visit OT  Treatments $Self Care/Home Management : 8-22 mins $Therapeutic Activity: 8-22 mins  Anderson Malta, OT Acute Rehab Services Office: [phone] 07/16/2022   Julien Girt 07/16/2022, 2:17 PM

## 2022-07-16 NOTE — Progress Notes (Signed)
Nutrition Follow-up  DOCUMENTATION CODES:   Non-severe (moderate) malnutrition in context of chronic illness  CALORIE COUNT RESULTS: Day 1 (3/6):  819 kcal (34% of minimum estimated needs)  30 protein (24% of minimum estimated needs) Day 2 (3/7): 565 kcal (24% of minimum estimated needs) 21g protein (17% of minimum estimated needs)   INTERVENTION:  - TPN to continue at half of goal rate (66m/hr) through the weekend per Surgery. This provides 65g protein, 180g dextrose, 372 [place] kcals = 1243 kcal - TPN management per pharmacy.    - Regular diet as medically appropriate. - Boost Breeze po TID, each supplement provides 250 kcal and 9 grams of protein  - If intake remains the same or decreases would recommend increasing back to goal TPN after the weekend.  - Monitor weight trends.    NUTRITION DIAGNOSIS:   Moderate Malnutrition related to chronic illness (transverse colon mass) as evidenced by moderate fat depletion, moderate muscle depletion. *ongoing  GOAL:   Patient will meet greater than or equal to 90% of their needs *unmet  MONITOR:   Diet advancement, Labs, Weight trends, PO intake  REASON FOR ASSESSMENT:   Consult New TPN/TNA  ASSESSMENT:   67year old male with history of transverse colon mass s/p transverse colectomy, DM2, who was admitted for an elective robotic ostomy takedown with lysis of adhesions on 07/01/22. Postoperatively noted to have right lower lobe collapse and hypoxemia as well as ileus.  2/22 takedown of end colostomy and anastomosis, lysis of adhesions, and repair of incarcerated incisional hernia 2/23 advanced to DYS 1 diet 2/24 pt vomited, NG placed->LIS 2/28 CL diet 2/29 DYS 1 diet, starting TPN 3/3 Increased to goal TPN 3/5 DYS 3 diet 3/6 Starting calorie count 3/7 TPN reduced to 1/2 rate  Calorie count complete results below:  Day 1 (3/6):  Breakfast: 0%, Lunch: 10%, Dinner: 0% Supplements: 3 Boost Breeze Total intake: 819  kcal (34% of minimum estimated needs)  30 protein (24% of minimum estimated needs)  Day 2 (3/7): Breakfast: 0%, Lunch: 0%, Dinner: few bites of chicken, green peas, rice  Supplements: 2 Boost Breeze (per pt reports) Total intake: 565 kcal (24% of minimum estimated needs) 21g protein (17% of minimum estimated needs)    Patient reports he feels he finally has some of his appetite back. Didn't have a breakfast or dinner yesterday but had a few bites of chicken, green peas, and rice for dinner last night. Drank 2 Boost Breeze yesterday per pt report.  Patient feels TPN being reduced to half rate helped his appetite so he wants to continue at half rate to hopefully continue to improve the next few days.  Encouraged patient to try and increase intake by having something to eat at each meal. Would like to see more consistency in patient's oral intake. Encouraged patient to continue to drink [employer]  Per Surgery note today, plan to continue TPN at half rate through the weekend and if patient eats well likely wean off TPN Monday (3/11)  Given results of calorie count, if intake remains the same or decreases, this RD would recommend increasing back to goal TPN after the weekend.   Medications reviewed and include: Insulin, Reglan, MVI, Fibercon  Labs reviewed:  Na 134 Creatinine 1.45 Triglycerides 87   Diet Order:   Diet Order             Diet regular Room service appropriate? Yes; Fluid consistency: Thin  Diet effective now  Diet - low sodium heart healthy                   EDUCATION NEEDS:  Education needs have been addressed  Skin:  Skin Assessment: Reviewed RN Assessment Skin Integrity Issues:: Incisions Incisions: R Abdomen  Last BM:  3/6  Height: Ht Readings from Last 1 Encounters:  07/01/22 '5\' 8"'$  (1.727 m)   Weight: Wt Readings from Last 1 Encounters:  07/16/22 90 kg    BMI:  Body mass index is 30.17 kg/m.  Estimated Nutritional Needs:   Kcal:  2400-2600 kcals Protein:  125-140 grams Fluid:  >/= 2.4L    Samson Frederic RD, LDN For contact information, refer to [facility].

## 2022-07-16 NOTE — Progress Notes (Signed)
Physical Therapy Treatment Patient Details Name: Thomas [name] MRN: [id number] DOB: 1955/06/19 Today's Date: 07/16/2022   History of Present Illness Patient is a 67 yo male admitted 07/01/22  S/P LYSIS OF ADHESIONS , PRIMARY REPAIR OF INCARCERATED INCISIONAL HERNIA , TAKEDOWN OF END COLOSTOMY WITH ANASTOMOSIS.2/24 moved to stepdown unit for hypoxemia, right lower lobe collapse CT chest showed debris in right lower lobe with atelectasis. Now with post op ileus, asp pneumonitis. PMH, colon and rectal  cancer, colostomy, VDRF with trach 2022,   DVT, afib, DM, HTN, seizure, CVA, fromtal meningioma, lobectomy    PT Comments    Assisted OOB to amb in hallway required increased time and effort.  General bed mobility comments: increased time, HOB elevated and heavy use of bed rails.  Increased ABD pain with activity.  Rest period required. General transfer comment: increased time and 25% VC's on safety with turns using walker.  General Gait Details: Unsteady gait trial without AD as prior mobility level.  25% VC's on proper walker to self distance esp with turns.  Distance limited by fatigue (low energy) and weakness (c/o B LE weakness).  Pt present with limited activity tolerance, gait instability as well as weakness.  Pt not progressing as well as expected along with increased hospital length of stay.  Consulted LPT, pt will need ST Rehab at SNF prior to safely returning home.   Recommendations for follow up therapy are one component of a multi-disciplinary discharge planning process, led by the attending physician.  Recommendations may be updated based on patient status, additional functional criteria and insurance authorization.  Follow Up Recommendations  Skilled nursing-short term rehab (<3 hours/day) Can patient physically be transported by private vehicle: Yes   Assistance Recommended at Discharge Intermittent Supervision/Assistance  Patient can return home with the following Assist for  transportation;Assistance with cooking/housework;Help with stairs or ramp for entrance;A little help with walking and/or transfers   Equipment Recommendations  None recommended by PT    Recommendations for Other Services       Precautions / Restrictions Precautions Precautions: Fall Precaution Comments: abdominal Restrictions Weight Bearing Restrictions: No     Mobility  Bed Mobility Overal bed mobility: Needs Assistance Bed Mobility: Supine to Sit     Supine to sit: HOB elevated, Min guard     General bed mobility comments: increased time, HOB elevated and heavy use of bed rails.  Increased ABD pain with activity.  Rest period required.    Transfers Overall transfer level: Needs assistance Equipment used: Rolling walker (2 wheels) Transfers: Sit to/from Stand Sit to Stand: Min guard, Min assist           General transfer comment: increased time and 25% VC's on safety with turns using walker.    Ambulation/Gait Ambulation/Gait assistance: Min guard, Min assist Gait Distance (Feet): 38 Feet Assistive device: Rolling walker (2 wheels) Gait Pattern/deviations: Step-through pattern, Decreased stride length Gait velocity: decr     General Gait Details: Unsteady gait trial without AD as prior mobility level.  25% VC's on proper walker to self distance esp with turns.  Distance limited by fatigue (low energy) and weakness (c/o B LE weakness).   Stairs             Wheelchair Mobility    Modified Rankin (Stroke Patients Only)       Balance  Cognition Arousal/Alertness: Awake/alert Behavior During Therapy: WFL for tasks assessed/performed Overall Cognitive Status: Within Functional Limits for tasks assessed                                 General Comments: AxO x 3 pleasant        Exercises      General Comments        Pertinent Vitals/Pain Pain Assessment Pain  Assessment: Faces Faces Pain Scale: Hurts little more Pain Location: abdomen, "comes and goes" Pain Descriptors / Indicators: Discomfort, Grimacing, Sore, Operative site guarding Pain Intervention(s): Monitored during session    Home Living                          Prior Function            PT Goals (current goals can now be found in the care plan section) Progress towards PT goals: Progressing toward goals    Frequency    Min 3X/week      PT Plan Current plan remains appropriate    Co-evaluation              AM-PAC PT "6 Clicks" Mobility   Outcome Measure  Help needed turning from your back to your side while in a flat bed without using bedrails?: A Little Help needed moving from lying on your back to sitting on the side of a flat bed without using bedrails?: A Little Help needed moving to and from a bed to a chair (including a wheelchair)?: A Little Help needed standing up from a chair using your arms (e.g., wheelchair or bedside chair)?: A Little Help needed to walk in hospital room?: A Lot Help needed climbing 3-5 steps with a railing? : Total 6 Click Score: 15    End of Session Equipment Utilized During Treatment: Gait belt Activity Tolerance: Patient tolerated treatment well Patient left: in chair;with call bell/phone within reach;with chair alarm set Nurse Communication: Mobility status PT Visit Diagnosis: Difficulty in walking, not elsewhere classified (R26.2);Pain     Time: [id number] PT Time Calculation (min) (ACUTE ONLY): 18 min  Charges:  $Gait Training: 8-22 mins                    Rica Koyanagi  PTA [facility] M-F          [phone] Weekend pager [phone]

## 2022-07-17 DIAGNOSIS — E785 Hyperlipidemia, unspecified: Secondary | ICD-10-CM

## 2022-07-17 DIAGNOSIS — C184 Malignant neoplasm of transverse colon: Secondary | ICD-10-CM | POA: Diagnosis not present

## 2022-07-17 DIAGNOSIS — D649 Anemia, unspecified: Secondary | ICD-10-CM | POA: Diagnosis not present

## 2022-07-17 DIAGNOSIS — E1121 Type 2 diabetes mellitus with diabetic nephropathy: Secondary | ICD-10-CM | POA: Diagnosis not present

## 2022-07-17 DIAGNOSIS — I10 Essential (primary) hypertension: Secondary | ICD-10-CM | POA: Diagnosis not present

## 2022-07-17 LAB — CBC
HCT: 23.3 % — ABNORMAL LOW (ref 39.0–52.0)
Hemoglobin: 8.1 g/dL — ABNORMAL LOW (ref 13.0–17.0)
MCH: 26.6 pg (ref 26.0–34.0)
MCHC: 34.8 g/dL (ref 30.0–36.0)
MCV: 76.4 fL — ABNORMAL LOW (ref 80.0–100.0)
Platelets: 565 10*3/uL — ABNORMAL HIGH (ref 150–400)
RBC: 3.05 MIL/uL — ABNORMAL LOW (ref 4.22–5.81)
RDW: 15.1 % (ref 11.5–15.5)
WBC: 13.8 10*3/uL — ABNORMAL HIGH (ref 4.0–10.5)
nRBC: 0 % (ref 0.0–0.2)

## 2022-07-17 LAB — COMPREHENSIVE METABOLIC PANEL
ALT: 258 U/L — ABNORMAL HIGH (ref 0–44)
AST: 147 U/L — ABNORMAL HIGH (ref 15–41)
Albumin: 2.3 g/dL — ABNORMAL LOW (ref 3.5–5.0)
Alkaline Phosphatase: 140 U/L — ABNORMAL HIGH (ref 38–126)
Anion gap: 4 — ABNORMAL LOW (ref 5–15)
BUN: 34 mg/dL — ABNORMAL HIGH (ref 8–23)
CO2: 23 mmol/L (ref 22–32)
Calcium: 7.7 mg/dL — ABNORMAL LOW (ref 8.9–10.3)
Chloride: 102 mmol/L (ref 98–111)
Creatinine, Ser: 1.52 mg/dL — ABNORMAL HIGH (ref 0.61–1.24)
GFR, Estimated: 51 mL/min — ABNORMAL LOW (ref >60)
Glucose, Bld: 127 mg/dL — ABNORMAL HIGH (ref 70–99)
Potassium: 4.3 mmol/L (ref 3.5–5.1)
Sodium: 129 mmol/L — ABNORMAL LOW (ref 135–145)
Total Bilirubin: 0.6 mg/dL (ref 0.3–1.2)
Total Protein: 6.9 g/dL (ref 6.5–8.1)

## 2022-07-17 LAB — GLUCOSE, CAPILLARY
Glucose-Capillary: 115 mg/dL — ABNORMAL HIGH (ref 70–99)
Glucose-Capillary: 132 mg/dL — ABNORMAL HIGH (ref 70–99)
Glucose-Capillary: 131 mg/dL — ABNORMAL HIGH (ref 70–99)
Glucose-Capillary: 137 mg/dL — ABNORMAL HIGH (ref 70–99)

## 2022-07-17 MED ORDER — TRAVASOL 10 % IV SOLN
INTRAVENOUS | Status: AC
  Filled 2022-07-17: qty 684

## 2022-07-17 NOTE — Progress Notes (Signed)
PHARMACY - TOTAL PARENTERAL NUTRITION CONSULT NOTE   Indication: Prolonged ileus  Patient Measurements: Height: '5\' 8"'$  (172.7 cm) Weight: 89.8 kg (198 lb) IBW/kg (Calculated) : 68.4 TPN AdjBW (KG): 73.1 Body mass index is 30.11 kg/m. Usual Weight:   Assessment: 67 yo male with hx colon cancer diagnosed 2022 s/p transverse colectomy admitted for elective robotic ostomy takedown with lysis of adhesions, incarcerated incisional hernia repair on 07/01/22. Post-op course complicated by right lower lobe collapse and hypoxemia. Pharmacy consulted for TPN on 2/29 d/t prolonged ileus.  Glucose / Insulin: Hx DM2 but A1c well controlled at 5.1 as of 2/22 - CBGs consistently < 180 w/ minimal SSI use >> discontinued SSI/monitoring Electrolytes: Na low and now < 130; others stable WNL Renal: CKD3 (baseline SCr ~1.5) - SCr stable at baseline; BUN elevated but stable; UOP remains adequate Hepatic: AST/ALT bumped to 6-7 x ULN on 3/8 but rapidly improving. Alk phos mildly elevated and improving - TG, T.bili stable WNL; Alb remains low-stable Intake / Output: NG out; last charted BM 3/6 - appears to be +2L this admit (per charting) - MIVF: NS @ [facility] GI Imaging: - 2/27 AXR: improving postop pneumoperitoneum - 2/29 AXR: possible ileus  - 3/1 CT a/p: difficult to r/o anastomotic leak d/t contrast not reaching anastomosis, but no signs of abscess GI Surgeries / Procedures:  - 2/22: robotic colostomy takedown and lysis of adhesions  Central access: implanted port. Double lumen PICC placed 3/2 TPN start date: 2/29  Nutritional Goals: Goal TPN rate is 100 mL/hr (provides 130 g of protein, 360 g dextrose, and 2486 kcals per day)  RD Assessment: pending Estimated Needs Total Energy Estimated Needs: 2400-2600 kcals Total Protein Estimated Needs: 125-140 grams Total Fluid Estimated Needs: >/= 2.4L  Current Nutrition:  Regular diet - eating ~30% of tray per MD 48-hr calorie count < 50% of calorie  goals Boost/Breeze TID per RD - took all yesterday TPN at 1/2 rate  Plan:  At 1800: Continue TPN at 1/2 rate (50 mL/hr); per surgery, patient needs to be eating >50% for TPN to start weaning down  Electrolytes in TPN: increase Na to max (will only provide an extra ~100 mEq/d given low TPN rate) Na 150 mEq/L  K 15 mEq/L Ca 0 mEq/L Mg 4 mEq/L Phos 10 mmol/L Cl:Ac 1:1 Continue enteral MVI as he is tolerating all PO meds.  No further SSI/CBG checks w/ well-controlled glucose mIVF at [facility]. Further management by MD Monitor TPN labs on Mon/Thurs BMP tomorrow (Na)  Reuel Boom, PharmD, BCPS [phone] 07/17/2022, 9:56 AM

## 2022-07-17 NOTE — Progress Notes (Signed)
  Progress Note   Patient: Thomas [name] [id number] DOB: 1955/06/21 DOA: 07/01/2022     16 DOS: the patient was seen and examined on 07/17/2022       Brief hospital course: Mr. Krasowski is a 67 y.o. M with DM, HTN, colon CA, hx stroke, seizures, meningioma who presented for electrive ostomy takedown.  Post-operative course complicated by delirium and hypoxia from atelectasis/mucus plugging/aspiration, medicine consulted.        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   Incisional hernia, without obstruction or gangrene - Nutrition and post-surgical care per primary    Transaminitis Improving - Trend, if continues to improve, then plan to recheck 2-3 weeks after discharge  Hyperlipidemia History of ischemic left MCA stroke 2015 - Hold Lipitor until LFTs resolve  Epilepsy ([place]) No seizures here - Continue Keppra  Hypertension BP normal - Continue amlodipine  Type 2 diabetes mellitus with diabetic nephropathy, without long-term current use of insulin ([facility]) Glucoses well controlled - Continue SS corrections          Subjective: No more vomiting, no confusion, no significant pain.     Physical Exam: BP 124/64 (BP Location: Left Arm)   Pulse 79   Temp 98 F (36.7 C) (Oral)   Resp 14   Ht 5\' 8"  (1.727 m)   Wt 89.8 kg   SpO2 98%   BMI 30.11 kg/m   Adult male, lying in bed, no acute distress RRR, no murmurs, no peripheral edema Respiratory normal, lungs clear without rales or wheezes Abdomen soft without tenderness palpation or guarding, no ascites or distention Mentation normal, face symmetric, speech fluent    Data Reviewed: Basic metabolic panel unremarkable, LFTs improving CBC stable        Disposition: Status is: Inpatient         Author: Edwin Dada, MD 07/17/2022 2:29 PM  For on call review [email or web address].

## 2022-07-17 NOTE — Progress Notes (Signed)
16 Days Post-Op   Subjective/Chief Complaint: Pt with no issues today Tol PO ~30% of tray No BM/flatus   Objective: Vital signs in last 24 hours: Temp:  [98.4 F (36.9 C)-98.9 F (37.2 C)] 98.5 F (36.9 C) (03/09 0450) Pulse Rate:  [77-92] 77 (03/09 0450) Resp:  [16-20] 16 (03/09 0450) BP: (114-134)/(56-70) 124/70 (03/09 0450) SpO2:  [97 %-98 %] 98 % (03/09 0450) Weight:  [89.8 kg] 89.8 kg (03/09 0748) Last BM Date : 07/14/22  Intake/Output from previous day: 03/08 0701 - 03/09 0700 In: [id number] [P.O.:120; I.V.:1221] Out: 1200 [Urine:1200] Intake/Output this shift: No intake/output data recorded.  PE:  Constitutional: No acute distress, conversant, appears states age. Eyes: Anicteric sclerae, moist conjunctiva, no lid lag Lungs: Clear to auscultation bilaterally, normal respiratory effort CV: regular rate and rhythm, no murmurs, no peripheral edema, pedal pulses 2+ GI: Soft, no masses or hepatosplenomegaly, non-tender to palpation Skin: No rashes, palpation reveals normal turgor Psychiatric: appropriate judgment and insight, oriented to person, place, and time   Lab Results:  Recent Labs    07/15/22 0250 07/17/22 0159  WBC 18.2* 13.8*  HGB 8.0* 8.1*  HCT 23.7* 23.3*  PLT 550* 565*   BMET Recent Labs    07/16/22 1022 07/17/22 0159  NA 133* 129*  K 4.6 4.3  CL 103 102  CO2 23 23  GLUCOSE 142* 127*  BUN 33* 34*  CREATININE 1.48* 1.52*  CALCIUM 8.1* 7.7*   PT/INR No results for input(s): "LABPROT", "INR" in the last 72 hours. ABG No results for input(s): "PHART", "HCO3" in the last 72 hours.  Invalid input(s): "PCO2", "PO2"  Studies/Results: ECHOCARDIOGRAM COMPLETE  Result Date: 07/16/2022    ECHOCARDIOGRAM REPORT   Patient Name:   Thomas [name] Date of Exam: 07/16/2022 Medical Rec #:  [id number]    Height:       68.0 in Accession #:    [id number]   Weight:       198.4 lb Date of Birth:  [birth year]    BSA:          2.037 m Patient Age:    27 years     BP:            138/72 mmHg Patient Gender: M            HR:           82 bpm. Exam Location:  Inpatient Procedure: 2D Echo, Cardiac Doppler and Color Doppler Indications:    R01.1 Murmur  History:        Patient has prior history of Echocardiogram examinations, most                 recent 12/02/2020. Abnormal ECG, Stroke, Arrythmias:Atrial                 Fibrillation, Signs/Symptoms:Altered Mental Status; Risk                 Factors:Diabetes, Hypertension and Dyslipidemia. Shock. Cancer.  Sonographer:    Roseanna Rainbow RDCS Referring Phys: [id number] CHRISTOPHER P DANFORD  Sonographer Comments: No subcostal window. IMPRESSIONS  1. Left ventricular ejection fraction, by estimation, is 60 to 65%. The left ventricle has normal function. The left ventricle has no regional wall motion abnormalities. There is mild concentric left ventricular hypertrophy. Left ventricular diastolic parameters are consistent with Grade I diastolic dysfunction (impaired relaxation).  2. Right ventricular systolic function is normal. The right ventricular size is normal. Tricuspid regurgitation signal is inadequate for assessing  PA pressure.  3. The mitral valve is normal in structure. No evidence of mitral valve regurgitation. No evidence of mitral stenosis.  4. The aortic valve is bicuspid. There is moderate calcification of the aortic valve. Aortic valve regurgitation is mild. Mild to moderate aortic valve stenosis. Aortic valve mean gradient measures 20.0 mmHg, AVA 1.65 cm^2.  5. IVC was not visualized. FINDINGS  Left Ventricle: Left ventricular ejection fraction, by estimation, is 60 to 65%. The left ventricle has normal function. The left ventricle has no regional wall motion abnormalities. The left ventricular internal cavity size was normal in size. There is  mild concentric left ventricular hypertrophy. Left ventricular diastolic parameters are consistent with Grade I diastolic dysfunction (impaired relaxation). Right Ventricle: The right  ventricular size is normal. No increase in right ventricular wall thickness. Right ventricular systolic function is normal. Tricuspid regurgitation signal is inadequate for assessing PA pressure. Left Atrium: Left atrial size was normal in size. Right Atrium: Right atrial size was normal in size. Pericardium: There is no evidence of pericardial effusion. Mitral Valve: The mitral valve is normal in structure. There is mild calcification of the mitral valve leaflet(s). Mild mitral annular calcification. No evidence of mitral valve regurgitation. No evidence of mitral valve stenosis. Tricuspid Valve: The tricuspid valve is normal in structure. Tricuspid valve regurgitation is trivial. Aortic Valve: The aortic valve is bicuspid. There is moderate calcification of the aortic valve. Aortic valve regurgitation is mild. Aortic regurgitation PHT measures 432 msec. Mild to moderate aortic stenosis is present. Aortic valve mean gradient measures 20.0 mmHg. Aortic valve peak gradient measures 32.5 mmHg. Aortic valve area, by VTI measures 1.75 cm. Pulmonic Valve: The pulmonic valve was normal in structure. Pulmonic valve regurgitation is not visualized. Aorta: The aortic root is normal in size and structure. Venous: The inferior vena cava was not well visualized. IAS/Shunts: No atrial level shunt detected by color flow Doppler.  LEFT VENTRICLE PLAX 2D LVIDd:         4.50 cm   Diastology LVIDs:         3.10 cm   LV e' medial:    6.85 cm/s LV PW:         1.10 cm   LV E/e' medial:  15.5 LV IVS:        1.00 cm   LV e' lateral:   5.77 cm/s LVOT diam:     2.40 cm   LV E/e' lateral: 18.4 LV SV:         93 LV SV Index:   46 LVOT Area:     4.52 cm  RIGHT VENTRICLE RV S prime:     11.20 cm/s TAPSE (M-mode): 1.8 cm LEFT ATRIUM             Index        RIGHT ATRIUM          Index LA diam:        4.20 cm 2.06 cm/m   RA Area:     6.89 cm LA Vol (A2C):   35.2 ml 17.28 ml/m  RA Volume:   9.60 ml  4.71 ml/m LA Vol (A4C):   40.3 ml 19.79  ml/m LA Biplane Vol: 39.2 ml 19.25 ml/m  AORTIC VALVE AV Area (Vmax):    1.84 cm AV Area (Vmean):   1.77 cm AV Area (VTI):     1.75 cm AV Vmax:           285.25 cm/s AV Vmean:  198.750 cm/s AV VTI:            0.530 m AV Peak Grad:      32.5 mmHg AV Mean Grad:      20.0 mmHg LVOT Vmax:         116.00 cm/s LVOT Vmean:        77.800 cm/s LVOT VTI:          0.205 m LVOT/AV VTI ratio: 0.39 AI PHT:            432 msec  AORTA Ao Root diam: 3.70 cm Ao Asc diam:  2.90 cm MITRAL VALVE MV Area (PHT): 3.72 cm     SHUNTS MV Decel Time: 204 msec     Systemic VTI:  0.20 m MV E velocity: 106.00 cm/s  Systemic Diam: 2.40 cm MV A velocity: 103.00 cm/s MV E/A ratio:  1.03 Dalton McleanMD Electronically signed by Franki Monte Signature Date/Time: 07/16/2022/8:55:58 AM    Final     Anti-infectives: Anti-infectives (From admission, onward)    Start     Dose/Rate Route Frequency Ordered Stop   07/10/22 0400  piperacillin-tazobactam (ZOSYN) IVPB 3.375 g  Status:  Discontinued        3.375 g 12.5 mL/hr over 240 Minutes Intravenous Every 8 hours 07/09/22 2040 07/10/22 0738   07/08/22 1100  piperacillin-tazobactam (ZOSYN) IVPB 3.375 g  Status:  Discontinued        3.375 g 12.5 mL/hr over 240 Minutes Intravenous Every 8 hours 07/08/22 1001 07/09/22 2040   07/08/22 1000  erythromycin 250 mg in sodium chloride 0.9 % 100 mL IVPB  Status:  Discontinued        250 mg 100 mL/hr over 60 Minutes Intravenous Every 12 hours 07/08/22 0739 07/09/22 0730   07/06/22 0915  erythromycin 250 mg in sodium chloride 0.9 % 100 mL IVPB  Status:  Discontinued        250 mg 100 mL/hr over 60 Minutes Intravenous Every 8 hours 07/06/22 0823 07/08/22 0739   07/03/22 1700  piperacillin-tazobactam (ZOSYN) IVPB 3.375 g        3.375 g 12.5 mL/hr over 240 Minutes Intravenous Every 8 hours 07/03/22 1612 07/08/22 0450   07/02/22 0000  cefoTEtan (CEFOTAN) 2 g in sodium chloride 0.9 % 100 mL IVPB        2 g 200 mL/hr over 30 Minutes  Intravenous Every 12 hours 07/01/22 1737 07/02/22 0051   07/01/22 1400  neomycin (MYCIFRADIN) tablet 1,000 mg  Status:  Discontinued       See Hyperspace for full Linked Orders Report.   1,000 mg Oral 3 times per day 07/01/22 1054 07/01/22 1109   07/01/22 1400  metroNIDAZOLE (FLAGYL) tablet 1,000 mg  Status:  Discontinued       See Hyperspace for full Linked Orders Report.   1,000 mg Oral 3 times per day 07/01/22 1054 07/01/22 1109   07/01/22 1100  cefoTEtan (CEFOTAN) 2 g in sodium chloride 0.9 % 100 mL IVPB        2 g 200 mL/hr over 30 Minutes Intravenous On call to O.R. 07/01/22 1054 07/01/22 1204       Assessment/Plan: s/p Procedure(s): ROBOTIC OSTOMY TAKEDOWN WITH INTRACORPOREAL ANASTOMOSIS WITH FIREFLY (N/A) LYSIS OF ADHESION (N/A) HERNIA REPAIR INGUINAL INCARCERATED (N/A) -Pt doing well with diet today taking about 30% of tray.   Encouraged PO -Con't TNA through weekend -mobilize as tol -planning for SNF likely Monday      LOS: 16 days  Ralene Ok 07/17/2022

## 2022-07-18 DIAGNOSIS — D649 Anemia, unspecified: Secondary | ICD-10-CM | POA: Diagnosis not present

## 2022-07-18 DIAGNOSIS — C184 Malignant neoplasm of transverse colon: Secondary | ICD-10-CM | POA: Diagnosis not present

## 2022-07-18 DIAGNOSIS — E1121 Type 2 diabetes mellitus with diabetic nephropathy: Secondary | ICD-10-CM | POA: Diagnosis not present

## 2022-07-18 DIAGNOSIS — I10 Essential (primary) hypertension: Secondary | ICD-10-CM | POA: Diagnosis not present

## 2022-07-18 LAB — BASIC METABOLIC PANEL
Anion gap: 8 (ref 5–15)
BUN: 35 mg/dL — ABNORMAL HIGH (ref 8–23)
CO2: 22 mmol/L (ref 22–32)
Calcium: 8.3 mg/dL — ABNORMAL LOW (ref 8.9–10.3)
Chloride: 102 mmol/L (ref 98–111)
Creatinine, Ser: 1.49 mg/dL — ABNORMAL HIGH (ref 0.61–1.24)
GFR, Estimated: 52 mL/min — ABNORMAL LOW (ref >60)
Glucose, Bld: 135 mg/dL — ABNORMAL HIGH (ref 70–99)
Potassium: 4.5 mmol/L (ref 3.5–5.1)
Sodium: 132 mmol/L — ABNORMAL LOW (ref 135–145)

## 2022-07-18 LAB — HEPATIC FUNCTION PANEL
ALT: 186 U/L — ABNORMAL HIGH (ref 0–44)
AST: 86 U/L — ABNORMAL HIGH (ref 15–41)
Albumin: 2.3 g/dL — ABNORMAL LOW (ref 3.5–5.0)
Alkaline Phosphatase: 139 U/L — ABNORMAL HIGH (ref 38–126)
Bilirubin, Direct: <0.1 mg/dL (ref 0.0–0.2)
Total Bilirubin: 0.5 mg/dL (ref 0.3–1.2)
Total Protein: 7.2 g/dL (ref 6.5–8.1)

## 2022-07-18 LAB — GLUCOSE, CAPILLARY: Glucose-Capillary: 126 mg/dL — ABNORMAL HIGH (ref 70–99)

## 2022-07-18 MED ORDER — TRAVASOL 10 % IV SOLN
INTRAVENOUS | Status: AC
  Filled 2022-07-18: qty 684

## 2022-07-18 NOTE — Progress Notes (Signed)
PHARMACY - TOTAL PARENTERAL NUTRITION CONSULT NOTE   Indication: Prolonged ileus  Patient Measurements: Height: '5\' 8"'$  (172.7 cm) Weight: 95 kg (209 lb 7 oz) IBW/kg (Calculated) : 68.4 TPN AdjBW (KG): 73.1 Body mass index is 31.84 kg/m. Usual Weight:   Assessment: 67 yo male with hx colon cancer diagnosed 2022 s/p transverse colectomy admitted for elective robotic ostomy takedown with lysis of adhesions, incarcerated incisional hernia repair on 07/01/22. Post-op course complicated by right lower lobe collapse and hypoxemia. Pharmacy consulted for TPN on 2/29 d/t prolonged ileus.  Glucose / Insulin: Hx DM2 but A1c well controlled at 5.1 as of 2/22 - CBGs consistently < 180 w/ minimal SSI use >> discontinued SSI/monitoring Electrolytes: Na low but improved after increasing in TPN; others stable WNL (last Mg, Phos on 3/8) Renal: CKD3 (baseline SCr ~1.5) - SCr stable at baseline; BUN elevated but stable; UOP remains adequate Hepatic: AST/ALT bumped to 6-7 x ULN on 3/8 but rapidly improving. Alk phos mildly elevated and also improving - TG, T.bili stable WNL; Alb remains low-stable Intake / Output: NG out; last charted BM 3/6 - appears to be +1L this admit (per charting) - PO intake slowly improving per Surgery - MIVF: NS @ K[facility] GI Imaging: - 2/27 AXR: improving postop pneumoperitoneum - 2/29 AXR: possible ileus  - 3/1 CT a/p: difficult to r/o anastomotic leak d/t contrast not reaching anastomosis, but no signs of abscess GI Surgeries / Procedures:  - 2/22: robotic colostomy takedown and lysis of adhesions  Central access: implanted port. Double lumen PICC placed 3/2 TPN start date: 2/29  Nutritional Goals: Goal TPN rate is 100 mL/hr (provides 130 g of protein, 360 g dextrose, and 2486 kcals per day)  RD Assessment: pending Estimated Needs Total Energy Estimated Needs: 2400-2600 kcals Total Protein Estimated Needs: 125-140 grams Total Fluid Estimated Needs: >/= 2.4L  Current  Nutrition:  Regular diet - eating ~50% of tray per MD Boost/Breeze TID per RD - took all yesterday TPN at 1/2 rate  Plan:  At 1800: Continue TPN at 1/2 rate (50 mL/hr); per surgery, patient needs to be eating >50% for TPN to start weaning down  Electrolytes in TPN: no changes; con't Na at max Na 150 mEq/L  K 15 mEq/L Ca 0 mEq/L Mg 4 mEq/L Phos 10 mmol/L Cl:Ac 1:1 Continue enteral MVI as he is tolerating all PO meds.  No further SSI/CBG checks w/ well-controlled glucose [facility]IVF at [facility]. Further management by MD Monitor TPN labs on Mon/Thurs  Reuel Boom, PharmD, BCPS [phone] 07/18/2022, 8:55 AM

## 2022-07-18 NOTE — Progress Notes (Signed)
17 Days Post-Op   Subjective/Chief Complaint: PT doing well  No BM or flatus per pt Tol about 50% of his tray    Objective: Vital signs in last 24 hours: Temp:  [98 F (36.7 C)-99.1 F (37.3 C)] 98.8 F (37.1 C) (03/10 0446) Pulse Rate:  [79-91] 88 (03/10 0446) Resp:  [14-20] 16 (03/10 0446) BP: (124-139)/(64-70) 139/70 (03/10 0446) SpO2:  [96 %-98 %] 96 % (03/10 0446) Weight:  [95 kg] 95 kg (03/10 0500) Last BM Date : 07/14/22  Intake/Output from previous day: 03/09 0701 - 03/10 0700 In: 1486.4 [P.O.:400; I.V.:1086.4] Out: 1700 [Urine:1700] Intake/Output this shift: No intake/output data recorded.  PE:  Constitutional: No acute distress, conversant, appears states age. Eyes: Anicteric sclerae, moist conjunctiva, no lid lag Lungs: Clear to auscultation bilaterally, normal respiratory effort CV: regular rate and rhythm, no murmurs, no peripheral edema, pedal pulses 2+ GI: Soft, no masses or hepatosplenomegaly, non-tender to palpation, distended Skin: No rashes, palpation reveals normal turgor Psychiatric: appropriate judgment and insight, oriented to person, place, and time   Lab Results:  Recent Labs    07/17/22 0159  WBC 13.8*  HGB 8.1*  HCT 23.3*  PLT 565*   BMET Recent Labs    07/17/22 0159 07/18/22 0148  NA 129* 132*  K 4.3 4.5  CL 102 102  CO2 23 22  GLUCOSE 127* 135*  BUN 34* 35*  CREATININE 1.52* 1.49*  CALCIUM 7.7* 8.3*   PT/INR No results for input(s): "LABPROT", "INR" in the last 72 hours. ABG No results for input(s): "PHART", "HCO3" in the last 72 hours.  Invalid input(s): "PCO2", "PO2"  Studies/Results: ECHOCARDIOGRAM COMPLETE  Result Date: 07/16/2022    ECHOCARDIOGRAM REPORT   Patient Name:   Thomas [name] Date of Exam: 07/16/2022 Medical Rec #:  [id number]    Height:       68.0 in Accession #:    [id number]   Weight:       198.4 lb Date of Birth:  [birth year]    BSA:          2.037 m Patient Age:    67 years     BP:           138/72 mmHg  Patient Gender: M            HR:           82 bpm. Exam Location:  Inpatient Procedure: 2D Echo, Cardiac Doppler and Color Doppler Indications:    R01.1 Murmur  History:        Patient has prior history of Echocardiogram examinations, most                 recent 12/02/2020. Abnormal ECG, Stroke, Arrythmias:Atrial                 Fibrillation, Signs/Symptoms:Altered Mental Status; Risk                 Factors:Diabetes, Hypertension and Dyslipidemia. Shock. Cancer.  Sonographer:    Roseanna Rainbow RDCS Referring Phys: [id number] CHRISTOPHER P DANFORD  Sonographer Comments: No subcostal window. IMPRESSIONS  1. Left ventricular ejection fraction, by estimation, is 60 to 65%. The left ventricle has normal function. The left ventricle has no regional wall motion abnormalities. There is mild concentric left ventricular hypertrophy. Left ventricular diastolic parameters are consistent with Grade I diastolic dysfunction (impaired relaxation).  2. Right ventricular systolic function is normal. The right ventricular size is normal. Tricuspid regurgitation signal is inadequate for assessing  PA pressure.  3. The mitral valve is normal in structure. No evidence of mitral valve regurgitation. No evidence of mitral stenosis.  4. The aortic valve is bicuspid. There is moderate calcification of the aortic valve. Aortic valve regurgitation is mild. Mild to moderate aortic valve stenosis. Aortic valve mean gradient measures 20.0 mmHg, AVA 1.65 cm^2.  5. IVC was not visualized. FINDINGS  Left Ventricle: Left ventricular ejection fraction, by estimation, is 60 to 65%. The left ventricle has normal function. The left ventricle has no regional wall motion abnormalities. The left ventricular internal cavity size was normal in size. There is  mild concentric left ventricular hypertrophy. Left ventricular diastolic parameters are consistent with Grade I diastolic dysfunction (impaired relaxation). Right Ventricle: The right ventricular size is normal.  No increase in right ventricular wall thickness. Right ventricular systolic function is normal. Tricuspid regurgitation signal is inadequate for assessing PA pressure. Left Atrium: Left atrial size was normal in size. Right Atrium: Right atrial size was normal in size. Pericardium: There is no evidence of pericardial effusion. Mitral Valve: The mitral valve is normal in structure. There is mild calcification of the mitral valve leaflet(s). Mild mitral annular calcification. No evidence of mitral valve regurgitation. No evidence of mitral valve stenosis. Tricuspid Valve: The tricuspid valve is normal in structure. Tricuspid valve regurgitation is trivial. Aortic Valve: The aortic valve is bicuspid. There is moderate calcification of the aortic valve. Aortic valve regurgitation is mild. Aortic regurgitation PHT measures 432 msec. Mild to moderate aortic stenosis is present. Aortic valve mean gradient measures 20.0 mmHg. Aortic valve peak gradient measures 32.5 mmHg. Aortic valve area, by VTI measures 1.75 cm. Pulmonic Valve: The pulmonic valve was normal in structure. Pulmonic valve regurgitation is not visualized. Aorta: The aortic root is normal in size and structure. Venous: The inferior vena cava was not well visualized. IAS/Shunts: No atrial level shunt detected by color flow Doppler.  LEFT VENTRICLE PLAX 2D LVIDd:         4.50 cm   Diastology LVIDs:         3.10 cm   LV e' medial:    6.85 cm/s LV PW:         1.10 cm   LV E/e' medial:  15.5 LV IVS:        1.00 cm   LV e' lateral:   5.77 cm/s LVOT diam:     2.40 cm   LV E/e' lateral: 18.4 LV SV:         93 LV SV Index:   46 LVOT Area:     4.52 cm  RIGHT VENTRICLE RV S prime:     11.20 cm/s TAPSE (M-mode): 1.8 cm LEFT ATRIUM             Index        RIGHT ATRIUM          Index LA diam:        4.20 cm 2.06 cm/m   RA Area:     6.89 cm LA Vol (A2C):   35.2 ml 17.28 ml/m  RA Volume:   9.60 ml  4.71 ml/m LA Vol (A4C):   40.3 ml 19.79 ml/m LA Biplane Vol: 39.2 ml  19.25 ml/m  AORTIC VALVE AV Area (Vmax):    1.84 cm AV Area (Vmean):   1.77 cm AV Area (VTI):     1.75 cm AV Vmax:           285.25 cm/s AV Vmean:  198.750 cm/s AV VTI:            0.530 m AV Peak Grad:      32.5 mmHg AV Mean Grad:      20.0 mmHg LVOT Vmax:         116.00 cm/s LVOT Vmean:        77.800 cm/s LVOT VTI:          0.205 m LVOT/AV VTI ratio: 0.39 AI PHT:            432 msec  AORTA Ao Root diam: 3.70 cm Ao Asc diam:  2.90 cm MITRAL VALVE MV Area (PHT): 3.72 cm     SHUNTS MV Decel Time: 204 msec     Systemic VTI:  0.20 m MV E velocity: 106.00 cm/s  Systemic Diam: 2.40 cm MV A velocity: 103.00 cm/s MV E/A ratio:  1.03 Dalton McleanMD Electronically signed by Franki Monte Signature Date/Time: 07/16/2022/8:55:58 AM    Final     Anti-infectives: Anti-infectives (From admission, onward)    Start     Dose/Rate Route Frequency Ordered Stop   07/10/22 0400  piperacillin-tazobactam (ZOSYN) IVPB 3.375 g  Status:  Discontinued        3.375 g 12.5 mL/hr over 240 Minutes Intravenous Every 8 hours 07/09/22 2040 07/10/22 0738   07/08/22 1100  piperacillin-tazobactam (ZOSYN) IVPB 3.375 g  Status:  Discontinued        3.375 g 12.5 mL/hr over 240 Minutes Intravenous Every 8 hours 07/08/22 1001 07/09/22 2040   07/08/22 1000  erythromycin 250 mg in sodium chloride 0.9 % 100 mL IVPB  Status:  Discontinued        250 mg 100 mL/hr over 60 Minutes Intravenous Every 12 hours 07/08/22 0739 07/09/22 0730   07/06/22 0915  erythromycin 250 mg in sodium chloride 0.9 % 100 mL IVPB  Status:  Discontinued        250 mg 100 mL/hr over 60 Minutes Intravenous Every 8 hours 07/06/22 0823 07/08/22 0739   07/03/22 1700  piperacillin-tazobactam (ZOSYN) IVPB 3.375 g        3.375 g 12.5 mL/hr over 240 Minutes Intravenous Every 8 hours 07/03/22 1612 07/08/22 0450   07/02/22 0000  cefoTEtan (CEFOTAN) 2 g in sodium chloride 0.9 % 100 mL IVPB        2 g 200 mL/hr over 30 Minutes Intravenous Every 12 hours 07/01/22  1737 07/02/22 0051   07/01/22 1400  neomycin (MYCIFRADIN) tablet 1,000 mg  Status:  Discontinued       See Hyperspace for full Linked Orders Report.   1,000 mg Oral 3 times per day 07/01/22 1054 07/01/22 1109   07/01/22 1400  metroNIDAZOLE (FLAGYL) tablet 1,000 mg  Status:  Discontinued       See Hyperspace for full Linked Orders Report.   1,000 mg Oral 3 times per day 07/01/22 1054 07/01/22 1109   07/01/22 1100  cefoTEtan (CEFOTAN) 2 g in sodium chloride 0.9 % 100 mL IVPB        2 g 200 mL/hr over 30 Minutes Intravenous On call to O.R. 07/01/22 1054 07/01/22 1204       Assessment/Plan: s/p Procedure(s): ROBOTIC OSTOMY TAKEDOWN WITH INTRACORPOREAL ANASTOMOSIS WITH FIREFLY (N/A) LYSIS OF ADHESION (N/A) HERNIA REPAIR INGUINAL INCARCERATED (N/A) -Pt doing well with diet today taking about 50% of tray.   Encouraged PO -Con't TNA through weekend, hopefully wean off on Monday before DC for snf  -mobilize as tol -planning for SNF likely Monday  LOS: 17 days    Ralene Ok 07/18/2022

## 2022-07-18 NOTE — Progress Notes (Signed)
  Progress Note   Patient: Thomas [name] [id number] DOB: [birth year] DOA: 07/01/2022     17 DOS: the patient was seen and examined on 07/18/2022       Brief hospital course: Thomas [name] is a 67 y.o. M with DM, HTN, colon CA, hx stroke, seizures, meningioma who presented for electrive ostomy takedown.  Post-operative course complicated by delirium and hypoxia from atelectasis/mucus plugging/aspiration, medicine consulted.        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  Likely due to TPN - Repeat LFTs in 2 to 3 weeks  Hyperlipidemia History of ischemic left MCA stroke 2015 - Hold Lipitor at discharge until LFTs rechecked  Epilepsy ([place]) No seizures here - Continue Keppra  Hypertension Blood pressure normal - Continue amlodipine  Type 2 diabetes mellitus with diabetic nephropathy, without long-term current use of insulin ([facility]) Glucose well-controlled - Continue sliding scale corrections          Subjective: Has some intermittent abdominal pain, but overall feels like he is improving, just generally weak.Marland Kitchen     Physical Exam: BP (!) 165/85 (BP Location: Left Arm)   Pulse 99   Temp 99.4 F (37.4 C) (Oral)   Resp 16   Ht 5\' 8"  (1.727 m)   Wt 95 kg   SpO2 98%   BMI 31.84 kg/m   Adult male, sitting up in recliner, interactive but tired RRR, no murmurs, no peripheral edema Respiratory normal, lungs clear without rales or wheezes Abdomen somewhat distended, overall soft, mild guarding Attention normal, affect normal, judgment insight appear normal   Data Reviewed: Basic metabolic panel shows LFTs improving CBC unchanged        Disposition: Status is: Inpatient         Author: Edwin Dada, MD 07/18/2022 3:53 PM  For on  call review [email or web address].

## 2022-07-19 ENCOUNTER — Inpatient Hospital Stay (HOSPITAL_COMMUNITY): Payer: Medicare PPO

## 2022-07-19 ENCOUNTER — Other Ambulatory Visit (HOSPITAL_COMMUNITY): Payer: Self-pay

## 2022-07-19 DIAGNOSIS — D649 Anemia, unspecified: Secondary | ICD-10-CM | POA: Diagnosis not present

## 2022-07-19 DIAGNOSIS — I10 Essential (primary) hypertension: Secondary | ICD-10-CM | POA: Diagnosis not present

## 2022-07-19 DIAGNOSIS — E1121 Type 2 diabetes mellitus with diabetic nephropathy: Secondary | ICD-10-CM | POA: Diagnosis not present

## 2022-07-19 DIAGNOSIS — C184 Malignant neoplasm of transverse colon: Secondary | ICD-10-CM | POA: Diagnosis not present

## 2022-07-19 LAB — COMPREHENSIVE METABOLIC PANEL
ALT: 145 U/L — ABNORMAL HIGH (ref 0–44)
ALT: 124 U/L — ABNORMAL HIGH (ref 0–44)
AST: 62 U/L — ABNORMAL HIGH (ref 15–41)
AST: 51 U/L — ABNORMAL HIGH (ref 15–41)
Albumin: 2.4 g/dL — ABNORMAL LOW (ref 3.5–5.0)
Albumin: 2.4 g/dL — ABNORMAL LOW (ref 3.5–5.0)
Alkaline Phosphatase: 143 U/L — ABNORMAL HIGH (ref 38–126)
Alkaline Phosphatase: 136 U/L — ABNORMAL HIGH (ref 38–126)
Anion gap: 10 (ref 5–15)
Anion gap: 5 (ref 5–15)
BUN: 37 mg/dL — ABNORMAL HIGH (ref 8–23)
BUN: 30 mg/dL — ABNORMAL HIGH (ref 8–23)
CO2: 21 mmol/L — ABNORMAL LOW (ref 22–32)
CO2: 23 mmol/L (ref 22–32)
Calcium: 8.3 mg/dL — ABNORMAL LOW (ref 8.9–10.3)
Calcium: 8 mg/dL — ABNORMAL LOW (ref 8.9–10.3)
Chloride: 102 mmol/L (ref 98–111)
Chloride: 104 mmol/L (ref 98–111)
Creatinine, Ser: 1.59 mg/dL — ABNORMAL HIGH (ref 0.61–1.24)
Creatinine, Ser: 1.62 mg/dL — ABNORMAL HIGH (ref 0.61–1.24)
GFR, Estimated: 48 mL/min — ABNORMAL LOW (ref >60)
GFR, Estimated: 47 mL/min — ABNORMAL LOW (ref >60)
Glucose, Bld: 123 mg/dL — ABNORMAL HIGH (ref 70–99)
Glucose, Bld: 138 mg/dL — ABNORMAL HIGH (ref 70–99)
Potassium: 4.3 mmol/L (ref 3.5–5.1)
Potassium: 4.5 mmol/L (ref 3.5–5.1)
Sodium: 133 mmol/L — ABNORMAL LOW (ref 135–145)
Sodium: 132 mmol/L — ABNORMAL LOW (ref 135–145)
Total Bilirubin: 0.6 mg/dL (ref 0.3–1.2)
Total Bilirubin: 0.6 mg/dL (ref 0.3–1.2)
Total Protein: 7.4 g/dL (ref 6.5–8.1)
Total Protein: 7.6 g/dL (ref 6.5–8.1)

## 2022-07-19 LAB — CBC
HCT: 26 % — ABNORMAL LOW (ref 39.0–52.0)
Hemoglobin: 8.7 g/dL — ABNORMAL LOW (ref 13.0–17.0)
MCH: 25.4 pg — ABNORMAL LOW (ref 26.0–34.0)
MCHC: 33.5 g/dL (ref 30.0–36.0)
MCV: 75.8 fL — ABNORMAL LOW (ref 80.0–100.0)
Platelets: 570 10*3/uL — ABNORMAL HIGH (ref 150–400)
RBC: 3.43 MIL/uL — ABNORMAL LOW (ref 4.22–5.81)
RDW: 15 % (ref 11.5–15.5)
WBC: 11.5 10*3/uL — ABNORMAL HIGH (ref 4.0–10.5)
nRBC: 0 % (ref 0.0–0.2)

## 2022-07-19 LAB — PHOSPHORUS: Phosphorus: 3.6 mg/dL (ref 2.5–4.6)

## 2022-07-19 LAB — TRIGLYCERIDES: Triglycerides: 63 mg/dL (ref <150)

## 2022-07-19 LAB — MAGNESIUM: Magnesium: 1.9 mg/dL (ref 1.7–2.4)

## 2022-07-19 MED ORDER — ACETAMINOPHEN 500 MG PO TABS
1000.0000 mg | ORAL_TABLET | Freq: Four times a day (QID) | ORAL | Status: DC
  Administered 2022-07-19 – 2022-07-23 (×13): 1000 mg via ORAL
  Filled 2022-07-19 (×14): qty 2

## 2022-07-19 MED ORDER — DOCUSATE SODIUM 100 MG PO CAPS
100.0000 mg | ORAL_CAPSULE | Freq: Two times a day (BID) | ORAL | Status: DC
  Administered 2022-07-19 – 2022-07-20 (×4): 100 mg via ORAL
  Filled 2022-07-19 (×4): qty 1

## 2022-07-19 MED ORDER — METHOCARBAMOL 500 MG PO TABS
1000.0000 mg | ORAL_TABLET | Freq: Four times a day (QID) | ORAL | Status: DC
  Administered 2022-07-19 – 2022-07-22 (×9): 1000 mg via ORAL
  Filled 2022-07-19 (×9): qty 2

## 2022-07-19 MED ORDER — TRAVASOL 10 % IV SOLN
INTRAVENOUS | Status: AC
  Filled 2022-07-19: qty 684

## 2022-07-19 MED ORDER — POLYETHYLENE GLYCOL 3350 17 G PO PACK
17.0000 g | PACK | Freq: Every day | ORAL | Status: DC
  Administered 2022-07-19 – 2022-07-21 (×3): 17 g via ORAL
  Filled 2022-07-19 (×3): qty 1

## 2022-07-19 NOTE — Care Management Important Message (Signed)
Important Message  Patient Details  Name: Thomas [name] MRN: [id number] Date of Birth: [birth year]   Medicare Important Message Given:  Yes     Memory Argue 07/19/2022, 11:03 AM

## 2022-07-19 NOTE — Progress Notes (Signed)
Physical Therapy Treatment Patient Details Name: Thomas [name] MRN: [id number] DOB: [birth year] Today's Date: 07/19/2022   History of Present Illness Patient is a 67 yo male admitted 07/01/22  S/P LYSIS OF ADHESIONS , PRIMARY REPAIR OF INCARCERATED INCISIONAL HERNIA , TAKEDOWN OF END COLOSTOMY WITH ANASTOMOSIS.2/24 moved to stepdown unit for hypoxemia, right lower lobe collapse CT chest showed debris in right lower lobe with atelectasis. Now with post op ileus, asp pneumonitis. PMH, colon and rectal  cancer, colostomy, VDRF with trach 2022,   DVT, afib, DM, HTN, seizure, CVA, fromtal meningioma, lobectomy    PT Comments    Pt AxO x 3 pleasant.  Assisted with amb in hallway.  General transfer comment: increased time and 25% VC's on safety with turns using walker. General Gait Details: Unsteady gait trial without AD as prior mobility level.  25% VC's on proper walker to self distance esp with turns.  Distance limited by fatigue (low energy) and weakness (c/o B LE weakness). Pt will need ST Rehab at SNF to address mobility and functional decline prior to safely returning home.   Recommendations for follow up therapy are one component of a multi-disciplinary discharge planning process, led by the attending physician.  Recommendations may be updated based on patient status, additional functional criteria and insurance authorization.  Follow Up Recommendations  Skilled nursing-short term rehab (<3 hours/day) Can patient physically be transported by private vehicle: Yes   Assistance Recommended at Discharge Intermittent Supervision/Assistance  Patient can return home with the following Assist for transportation;Assistance with cooking/housework;Help with stairs or ramp for entrance;A little help with walking and/or transfers   Equipment Recommendations  None recommended by PT    Recommendations for Other Services       Precautions / Restrictions Precautions Precautions: Fall Precaution Comments:  abdominal Restrictions Weight Bearing Restrictions: No     Mobility  Bed Mobility               General bed mobility comments: OOB in recliner    Transfers Overall transfer level: Needs assistance Equipment used: Rolling walker (2 wheels) Transfers: Sit to/from Stand Sit to Stand: Min guard, Min assist           General transfer comment: increased time and 25% VC's on safety with turns using walker.    Ambulation/Gait Ambulation/Gait assistance: Min guard, Min assist Gait Distance (Feet): 55 Feet Assistive device: Rolling walker (2 wheels) Gait Pattern/deviations: Step-through pattern, Decreased stride length Gait velocity: decr     General Gait Details: Unsteady gait trial without AD as prior mobility level.  25% VC's on proper walker to self distance esp with turns.  Distance limited by fatigue (low energy) and weakness (c/o B LE weakness).   Stairs             Wheelchair Mobility    Modified Rankin (Stroke Patients Only)       Balance                                            Cognition Arousal/Alertness: Awake/alert Behavior During Therapy: WFL for tasks assessed/performed Overall Cognitive Status: Within Functional Limits for tasks assessed                                 General Comments: AxO x 3 pleasant  Exercises      General Comments        Pertinent Vitals/Pain Pain Assessment Pain Assessment: Faces Faces Pain Scale: Hurts a little bit Pain Location: abdomen, "comes and goes" Pain Descriptors / Indicators: Discomfort, Grimacing, Sore, Operative site guarding Pain Intervention(s): Monitored during session    Home Living                          Prior Function            PT Goals (current goals can now be found in the care plan section) Progress towards PT goals: Progressing toward goals    Frequency    Min 3X/week      PT Plan Current plan remains  appropriate    Co-evaluation              AM-PAC PT "6 Clicks" Mobility   Outcome Measure  Help needed turning from your back to your side while in a flat bed without using bedrails?: A Little Help needed moving from lying on your back to sitting on the side of a flat bed without using bedrails?: A Little Help needed moving to and from a bed to a chair (including a wheelchair)?: A Little Help needed standing up from a chair using your arms (e.g., wheelchair or bedside chair)?: A Little Help needed to walk in hospital room?: A Lot Help needed climbing 3-5 steps with a railing? : Total 6 Click Score: 15    End of Session Equipment Utilized During Treatment: Gait belt Activity Tolerance: Patient tolerated treatment well Patient left: in chair;with call bell/phone within reach;with chair alarm set Nurse Communication: Mobility status PT Visit Diagnosis: Difficulty in walking, not elsewhere classified (R26.2);Pain     Time: 1510-1530 PT Time Calculation (min) (ACUTE ONLY): 20 min  Charges:  $Gait Training: 8-22 mins                     {Yusef Lamp  PTA Acute  [employer] M-F          [phone] Weekend pager [phone]

## 2022-07-19 NOTE — TOC Progression Note (Addendum)
Transition of Care [facility]) - Progression Note   Patient Details  Name: Thomas [name] MRN: [id number] Date of Birth: 06-25-1955  Transition of Care [facility]) CM/SW [place], LCSW Phone Number: 07/19/2022, 2:24 PM  Clinical Narrative: Patient is expected to be weaned off TPN in the next few days. Patient is agreeable to [facility] for rehab as it is his only bed offer. CSW left voicemail for Crystal with [facility] to confirm bed and requested call back.  Addendum: CSW received call back from [employer] with [facility]. Facility will be able to accept the patient whenever he is weaned of TPN and insurance authorization is completed.  Expected Discharge Plan: Skilled Nursing Facility Barriers to Discharge: Continued Medical Work up, SNF Pending bed offer, Ship broker  Expected Discharge Plan and Services In-house Referral: NA Discharge Planning Services: CM Consult Post Acute Care Choice: [place] Living arrangements for the past 2 months: Single Family Home          DME Arranged: N/A DME Agency: NA HH Arranged: PT, Nurse's Aide  Social Determinants of Health (SDOH) Interventions SDOH Screenings   Food Insecurity: No Food Insecurity (07/01/2022)  Housing: Low Risk  (07/01/2022)  Transportation Needs: No Transportation Needs (07/01/2022)  Utilities: Not At Risk (07/01/2022)  Depression (PHQ2-9): Low Risk  (04/28/2021)  Tobacco Use: Medium Risk (07/09/2022)    Readmission Risk Interventions    07/07/2022    5:56 PM 02/16/2021   10:08 AM  Readmission Risk Prevention Plan  Transportation Screening Complete Complete  PCP or Specialist Appt within 3-5 Days Complete   HRI or [place] Complete   Social Work Consult for [place] Planning/Counseling [place] Not Applicable   Medication Review Press photographer) Complete Complete  PCP or Specialist appointment within 3-5 days of discharge  Complete  HRI or [place]  Complete  SW Recovery Care/Counseling Consult  Complete  Palliative Care Screening  Complete  Skilled Nursing Facility  Complete

## 2022-07-19 NOTE — Progress Notes (Signed)
  Progress Note   Patient: Thomas [name] [id number] DOB: 10-15-1955 DOA: 07/01/2022     18 DOS: the patient was seen and examined on 07/19/2022       Brief hospital course: Thomas [name] is a 68 y.o. M with DM, HTN, colon CA, hx stroke, seizures, meningioma who presented for electrive ostomy takedown.  Post-operative course complicated by delirium and hypoxia from atelectasis/mucus plugging/aspiration, medicine consulted.        Assessment and Plan: * Transaminitis I think this was from TPN. - Hold statin at d/c - Repeat LFTs in 2 to 3 weeks and restart statin at that time  Epilepsy ([place]) No seizures here - Continue Keppra, no change  Hypertension BP slightly high - Continue amlodipine, no change  Type 2 diabetes mellitus with diabetic nephropathy, without long-term current use of insulin (HCC) GLucose excellent - Continue sliding scale corrections - No new meds at d/c needed          Subjective: Having increased abdominal pain today, having frequent hiccups still.  No fever, no vomiting, no confusion, no respiratory symptoms.    Physical Exam: BP (!) 153/80 (BP Location: Left Arm)   Pulse 93   Temp 98.5 F (36.9 C) (Oral)   Resp 18   Ht 5\' 8"  (1.727 m)   Wt 94.8 kg   SpO2 95%   BMI 31.78 kg/m   Adult male, sitting up in recliner, tired RRR, no murmurs, no peripheral edema Respiratory rate normal, lungs clear without rales or wheezes Abdomen with some guarding, mild distention, no rigidity or rebound Attention normal, affect blunted by pain, judgment insight appear normal, face symmetric, speech fluent, moves upper extremities with normal strength and coordination   Data Reviewed: LFTs are improving        Disposition: Status is: Inpatient No barriers to discharge from a purely medical standpoint        Author: Edwin Dada, MD 07/19/2022 2:16 PM  For on call review [email or web address].

## 2022-07-19 NOTE — Progress Notes (Signed)
18 Days Post-Op   Subjective/Chief Complaint: No BM since 3/8 and not passing much flatus. Having significant gas cramps and intermittent hiccups. Did not eat much yesterday - jello, a few snacks, ensure/breeze. Has not eaten yet this morning. He is trying to get OOB and ambulate some   Objective: Vital signs in last 24 hours: Temp:  [97.4 F (36.3 C)-99.4 F (37.4 C)] 98.6 F (37 C) (03/11 0550) Pulse Rate:  [89-99] 89 (03/11 0550) Resp:  [18] 18 (03/11 0550) BP: (149-165)/(72-85) 151/72 (03/11 0550) SpO2:  [96 %-100 %] 100 % (03/11 0550) Weight:  [94.8 kg] 94.8 kg (03/11 0708) Last BM Date : 07/14/22  Intake/Output from previous day: 03/10 0701 - 03/11 0700 In: 1581.3 [P.O.:540; I.V.:1041.3] Out: 1600 [Urine:1600] Intake/Output this shift: Total I/O In: 120 [P.O.:120] Out: 400 [Urine:400]  PE:  Constitutional: No acute distress, conversant, appears stated age. Lungs: normal respiratory effort on room air GI: Soft, no masses or hepatosplenomegaly, mild TTP across mid abdomen without rebound or guarding. Moderate distension. Healing incisions Skin: No rashes, palpation reveals normal turgor Psychiatric: appropriate judgment and insight, oriented to person, place, and time   Lab Results:  Recent Labs    07/17/22 0159  WBC 13.8*  HGB 8.1*  HCT 23.3*  PLT 565*    BMET Recent Labs    07/18/22 0148 07/19/22 0241  NA 132* 133*  K 4.5 4.3  CL 102 102  CO2 22 21*  GLUCOSE 135* 123*  BUN 35* 37*  CREATININE 1.49* 1.59*  CALCIUM 8.3* 8.3*    PT/INR No results for input(s): "LABPROT", "INR" in the last 72 hours. ABG No results for input(s): "PHART", "HCO3" in the last 72 hours.  Invalid input(s): "PCO2", "PO2"  Studies/Results: No results found.  Anti-infectives: Anti-infectives (From admission, onward)    Start     Dose/Rate Route Frequency Ordered Stop   07/10/22 0400  piperacillin-tazobactam (ZOSYN) IVPB 3.375 g  Status:  Discontinued        3.375  g 12.5 mL/hr over 240 Minutes Intravenous Every 8 hours 07/09/22 2040 07/10/22 0738   07/08/22 1100  piperacillin-tazobactam (ZOSYN) IVPB 3.375 g  Status:  Discontinued        3.375 g 12.5 mL/hr over 240 Minutes Intravenous Every 8 hours 07/08/22 1001 07/09/22 2040   07/08/22 1000  erythromycin 250 mg in sodium chloride 0.9 % 100 mL IVPB  Status:  Discontinued        250 mg 100 mL/hr over 60 Minutes Intravenous Every 12 hours 07/08/22 0739 07/09/22 0730   07/06/22 0915  erythromycin 250 mg in sodium chloride 0.9 % 100 mL IVPB  Status:  Discontinued        250 mg 100 mL/hr over 60 Minutes Intravenous Every 8 hours 07/06/22 0823 07/08/22 0739   07/03/22 1700  piperacillin-tazobactam (ZOSYN) IVPB 3.375 g        3.375 g 12.5 mL/hr over 240 Minutes Intravenous Every 8 hours 07/03/22 1612 07/08/22 0450   07/02/22 0000  cefoTEtan (CEFOTAN) 2 g in sodium chloride 0.9 % 100 mL IVPB        2 g 200 mL/hr over 30 Minutes Intravenous Every 12 hours 07/01/22 1737 07/02/22 0051   07/01/22 1400  neomycin (MYCIFRADIN) tablet 1,000 mg  Status:  Discontinued       See Hyperspace for full Linked Orders Report.   1,000 mg Oral 3 times per day 07/01/22 1054 07/01/22 1109   07/01/22 1400  metroNIDAZOLE (FLAGYL) tablet 1,000 mg  Status:  Discontinued       See Hyperspace for full Linked Orders Report.   1,000 mg Oral 3 times per day 07/01/22 1054 07/01/22 1109   07/01/22 1100  cefoTEtan (CEFOTAN) 2 g in sodium chloride 0.9 % 100 mL IVPB        2 g 200 mL/hr over 30 Minutes Intravenous On call to O.R. 07/01/22 1054 07/01/22 1204       Assessment/Plan: s/p Procedure(s): ROBOTIC OSTOMY TAKEDOWN WITH INTRACORPOREAL ANASTOMOSIS WITH FIREFLY (N/A) LYSIS OF ADHESION (N/A) HERNIA REPAIR INGUINAL INCARCERATED (N/A) Dr. Johney Maine 2/22 POD 18 - still having symptoms of ileus and not taking enough PO.  Encouraged PO and ensure/breeze. Cont scheduled reglan - no BM since 3/8 - add colace bid and miralax qd. Encouraged  ambulation. Discussed pain control and add scheduled tylenol and robaxin to try to limit oxycodone prn -Con't reduced rate TNA today. Consider stopping tomorrow if PO intake improves -planning for SNF once PO intake improves  FEN: reg, TNA '@50'$  ml/hr, ensure/breeze ID: none currently VTE: lovenox  Transaminitis - in setting of TPN HLD Epilepsy HTN T2DM   LOS: 18 days    Winferd Humphrey, [facility] Surgery 07/19/2022, 8:44 AM Please see Amion for pager number during day hours 7:00am-4:30pm

## 2022-07-19 NOTE — Progress Notes (Signed)
PHARMACY - TOTAL PARENTERAL NUTRITION CONSULT NOTE   Indication: Prolonged ileus  Patient Measurements: Height: '5\' 8"'$  (172.7 cm) Weight: 94.8 kg (208 lb 15.9 oz) IBW/kg (Calculated) : 68.4 TPN AdjBW (KG): 73.1 Body mass index is 31.78 kg/m. Usual Weight:   Assessment: 67 yo male with hx colon cancer diagnosed 2022 s/p transverse colectomy admitted for elective robotic ostomy takedown with lysis of adhesions, incarcerated incisional hernia repair on 07/01/22. Post-op course complicated by right lower lobe collapse and hypoxemia. Pharmacy consulted for TPN on 2/29 d/t prolonged ileus.  Glucose / Insulin: Hx DM2 but A1c well controlled at 5.1 as of 2/22 - CBGs consistently < 180 w/ minimal SSI use >> 3/10 SSI/monitoring was discontinued Electrolytes: Na low but stable (at max in TPN 161mq/L), others stable WNL  Renal: CKD3 (baseline SCr ~1.5) - SCr stable at baseline; BUN elevated but stable; UOP remains adequate Hepatic: AST/ALT bumped to 6-7 x ULN on 3/8 but rapidly improving. Alk phos mildly elevated. - TG, T.bili stable WNL; Alb remains low-stable Intake / Output: NG out; last charted BM 3/6 - output 16067min past 24 hrs - 3 unmeasured urine occurrence in past 24hrs.  -PO intake slowly improving per Surgery - MIVF: NS @ KVO GI Imaging: - 2/27 AXR: improving postop pneumoperitoneum - 2/29 AXR: possible ileus  - 3/1 CT a/p: difficult to r/o anastomotic leak d/t contrast not reaching anastomosis, but no signs of abscess GI Surgeries / Procedures:  - 2/22: robotic colostomy takedown and lysis of adhesions  Central access: implanted port. Double lumen PICC placed 3/2 TPN start date: 2/29  Nutritional Goals: Goal TPN rate is 100 mL/hr (provides 130 g of protein, 360 g dextrose, and 2486 kcals per day)  RD Assessment: pending Estimated Needs Total Energy Estimated Needs: 2400-2600 kcals Total Protein Estimated Needs: 125-140 grams Total Fluid Estimated Needs: >/=  2.4L  Current Nutrition:  Regular diet - eating  Boost/Breeze TID per RD  TPN at 1/2 rate  Plan:  At 1800: Continue TPN at 1/2 rate (50 mL/hr); per surgery, patient needs to be eating >50% for TPN to start weaning down  Per [facility]gery continue TPN today, will reassess tomorrow. (Per note: Plan to discharge to SNF soon)   Electrolytes in TPN: no changes; con't Na at max Na 154 mEq/L  K 15 mEq/L Ca 0 mEq/L Mg 4 mEq/L Phos 10 mmol/L Cl:Ac 1:1 Continue enteral MVI as he is tolerating all PO meds.  No further SSI/CBG checks w/ well[facility] Monitor TPN labs on Mon/Thurs  AmMendel RyderPharmD Clinical Pharmacist 07/19/2022 10:37 AM

## 2022-07-20 ENCOUNTER — Other Ambulatory Visit (HOSPITAL_COMMUNITY): Payer: Self-pay

## 2022-07-20 DIAGNOSIS — N1832 Chronic kidney disease, stage 3b: Secondary | ICD-10-CM | POA: Diagnosis not present

## 2022-07-20 DIAGNOSIS — D649 Anemia, unspecified: Secondary | ICD-10-CM | POA: Diagnosis not present

## 2022-07-20 DIAGNOSIS — G40909 Epilepsy, unspecified, not intractable, without status epilepticus: Secondary | ICD-10-CM | POA: Diagnosis not present

## 2022-07-20 DIAGNOSIS — C184 Malignant neoplasm of transverse colon: Secondary | ICD-10-CM | POA: Diagnosis not present

## 2022-07-20 LAB — CBC
HCT: 23.4 % — ABNORMAL LOW (ref 39.0–52.0)
Hemoglobin: 8 g/dL — ABNORMAL LOW (ref 13.0–17.0)
MCH: 25.9 pg — ABNORMAL LOW (ref 26.0–34.0)
MCHC: 34.2 g/dL (ref 30.0–36.0)
MCV: 75.7 fL — ABNORMAL LOW (ref 80.0–100.0)
Platelets: 530 10*3/uL — ABNORMAL HIGH (ref 150–400)
RBC: 3.09 MIL/uL — ABNORMAL LOW (ref 4.22–5.81)
RDW: 15.1 % (ref 11.5–15.5)
WBC: 12.5 10*3/uL — ABNORMAL HIGH (ref 4.0–10.5)
nRBC: 0 % (ref 0.0–0.2)

## 2022-07-20 LAB — COMPREHENSIVE METABOLIC PANEL
ALT: 101 U/L — ABNORMAL HIGH (ref 0–44)
AST: 42 U/L — ABNORMAL HIGH (ref 15–41)
Albumin: 2.3 g/dL — ABNORMAL LOW (ref 3.5–5.0)
Alkaline Phosphatase: 117 U/L (ref 38–126)
Anion gap: 8 (ref 5–15)
BUN: 38 mg/dL — ABNORMAL HIGH (ref 8–23)
CO2: 24 mmol/L (ref 22–32)
Calcium: 8.2 mg/dL — ABNORMAL LOW (ref 8.9–10.3)
Chloride: 101 mmol/L (ref 98–111)
Creatinine, Ser: 1.62 mg/dL — ABNORMAL HIGH (ref 0.61–1.24)
GFR, Estimated: 47 mL/min — ABNORMAL LOW (ref >60)
Glucose, Bld: 134 mg/dL — ABNORMAL HIGH (ref 70–99)
Potassium: 4.5 mmol/L (ref 3.5–5.1)
Sodium: 133 mmol/L — ABNORMAL LOW (ref 135–145)
Total Bilirubin: 0.5 mg/dL (ref 0.3–1.2)
Total Protein: 7.1 g/dL (ref 6.5–8.1)

## 2022-07-20 MED ORDER — TRAVASOL 10 % IV SOLN
INTRAVENOUS | Status: AC
  Filled 2022-07-20: qty 684

## 2022-07-20 MED ORDER — ENSURE ENLIVE PO LIQD
237.0000 mL | Freq: Two times a day (BID) | ORAL | Status: DC
  Administered 2022-07-22 – 2022-07-23 (×3): 237 mL via ORAL

## 2022-07-20 NOTE — Progress Notes (Addendum)
Nutrition Follow-up  DOCUMENTATION CODES:   Non-severe (moderate) malnutrition in context of chronic illness  INTERVENTION:   -TPN management per Pharmacy  Addendum [id number]     -Recommend increasing to goal rate now that pt is NPO and having N/V  -Boost Breeze po TID, each supplement provides 250 kcal and 9 grams of protein  -Ensure Enlive po BID, each supplement provides 350 kcal and 20 grams of protein.  NUTRITION DIAGNOSIS:   Moderate Malnutrition related to chronic illness (transverse colon mass) as evidenced by moderate fat depletion, moderate muscle depletion.  GOAL:   Patient will meet greater than or equal to 90% of their needs  MONITOR:   Diet advancement, Labs, Weight trends, PO intake, TPN   ASSESSMENT:   67 year old male with history of transverse colon mass s/p transverse colectomy, DM2, who was admitted for an elective robotic ostomy takedown with lysis of adhesions on 07/01/22. Postoperatively noted to have right lower lobe collapse and hypoxemia as well as ileus.  2/22 takedown of end colostomy and anastomosis, lysis of adhesions, and repair of incarcerated incisional hernia 2/23 advanced to DYS 1 diet 2/24 pt vomited, NG placed->LIS 2/28 CL diet 2/29 DYS 1 diet, starting TPN 3/3 Increased to goal TPN 3/5 DYS 3 diet 3/6 Starting calorie count 3/7 TPN reduced to 1/2 rate  Patient in room, family at bedside. Pt states he is trying to eat more today. He has had 1/2 donut, 1/2 bag of potato chips so far today. Plans to drink his supplement and eat some roast beef for lunch. Pt states he is getting tired of Boost Breeze, wanting to try a chocolate Ensure, brought one to patient.  Encouraged pt to try and drink at least 2 of the Ensures today.  TPN running at half rate of 50 ml/hr, providing 1204 kcals and 68g protein.  Admission weight: 192 lbs Current weight: 209 lbs Weight trending up over the past 48 hours. Mild edema noted in nursing  assessment.  Medications: Colace, Reglan, Multivitamin with minerals daily, Fibercon, Miralax  Labs reviewed: CBGs: 126-137 Low Na   Diet Order:   Diet Order             Diet regular Room service appropriate? Yes; Fluid consistency: Thin  Diet effective now           Diet - low sodium heart healthy                   EDUCATION NEEDS:   Education needs have been addressed  Skin:  Skin Assessment: Reviewed RN Assessment Skin Integrity Issues:: Incisions Incisions: R Abdomen  Last BM:  3/9 -type 7  Height:   Ht Readings from Last 1 Encounters:  07/01/22 '5\' 8"'$  (1.727 m)    Weight:   Wt Readings from Last 1 Encounters:  07/19/22 94.8 kg    BMI:  Body mass index is 31.78 kg/m.  Estimated Nutritional Needs:   Kcal:  2400-2600 kcals  Protein:  125-140 grams  Fluid:  >/= 2.4L  Clayton Bibles, MS, RD, LDN Inpatient Clinical Dietitian Contact information available via Amion

## 2022-07-20 NOTE — Progress Notes (Signed)
  Progress Note   Patient: Thomas [name] [id number] DOB: Aug 05, 1955 DOA: 07/01/2022     19 DOS: the patient was seen and examined on 07/20/2022       Brief hospital course: Mr. Cardosa is a 67 y.o. M with DM, HTN, colon CA, hx stroke, seizures, meningioma who presented for electrive ostomy takedown.  Post-operative course complicated by delirium and hypoxia from atelectasis/mucus plugging/aspiration, medicine consulted.        Assessment and Plan: * Colon cancer ([place]) 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   Incisional hernia, without obstruction or gangrene - Nutrition and post-surgical care per primary    Transaminitis Transient, resolving - Hold statin at d/c - Repeat LFTs in 2 to 3 weeks and restart statin at that time  Acute metabolic encephalopathy Had had delirium earlier in stay due to anesthesia, medications, ICU, hypoxia, pain.  Had resolved.    Some hallucinations again last night in setting of pain medications. - Standard delirium precautions   Bicuspid aortic valve Loud murmur, echo shows normal EF, bicuspid aortic valve with some stenosis, mild to moderate, no other unusual findings.  Prior echo did not characterize aortic valve is bicuspid - Follow-up with PCP for Cards outpatient referral  Atelectasis/mucus plugging Resolved - Standard pulmonary toilet  Epilepsy ([place]) No seizures here - Continue Keppra, no change  Hypertension BP normal - Continue amlodipine, no change  Type 2 diabetes mellitus with diabetic nephropathy, without long-term current use of insulin (HCC) Glucose controlled, not on home meds - Continue sliding scale corrections - No new meds at d/c needed  CKD (chronic kidney disease) stage 3b Stable  Obesity BMI 30.1        Subjective: Having frequent hiccups, increased abdominal pain.  Malaise, hallucinations overnight.  No vomiting, no confusion at this time, no respiratory  symptoms.    Physical Exam: BP 124/63 (BP Location: Left Arm)   Pulse 89   Temp 98.6 F (37 C) (Oral)   Resp 18   Ht 5\' 8"  (1.727 m)   Wt 94.8 kg   SpO2 94%   BMI 31.78 kg/m   Adult male, sitting up in recliner, appears weak and tired RRR, no murmurs, no lower extremity edema, no JVD Respiratory rate normal, lung sounds diminished Abdomen with voluntary guarding throughout, tenderness throughout Attention seems normal, affect blunted by pain, judgment and insight appear normal, severe generalized weakness     Data Reviewed: Mild hyponatremia, clinically insignificant, creatinine stable, LFTs improving White blood cell count up to 12, hemoglobin down to 8, platelets elevated        Disposition: Status is: Inpatient Oral intake remains primary barrier to discharge, defer to General Surgery        Author: Edwin Dada, MD 07/20/2022 2:59 PM  For on call review [email or web address].

## 2022-07-20 NOTE — Progress Notes (Signed)
19 Days Post-Op   Subjective/Chief Complaint: Had some confusion and possibly hallucinations yesterday evening which was very upsetting for him. He says this has happened to him in the past and may be related to IV pain medications. He is not sure what he had to eat yesterday besides half a donut. He states he threw up this morning. Denies recent flatus or BM. Continues to have intermittent hiccups, belching, crampy abdominal pain. No nausea currently. Ambulated in hallway yesterday  Objective: Vital signs in last 24 hours: Temp:  [98.5 F (36.9 C)-98.6 F (37 C)] 98.6 F (37 C) (03/12 0552) Pulse Rate:  [89-93] 89 (03/12 0552) Resp:  [16-18] 18 (03/12 0552) BP: (124-153)/(63-80) 124/63 (03/12 0552) SpO2:  [94 %-96 %] 94 % (03/12 0552) Last BM Date : 07/14/22  Intake/Output from previous day: 03/11 0701 - 03/12 0700 In: 973.9 [P.O.:270; I.V.:703.9] Out: 1900 [Urine:1900] Intake/Output this shift: No intake/output data recorded.  PE:  Constitutional: No acute distress, conversant, appears stated age. Lungs: normal respiratory effort on room air GI: Soft, no masses or hepatosplenomegaly, mild TTP across mid abdomen without rebound or guarding. Moderate distension. Healing incisions Skin: No rashes, palpation reveals normal turgor Psychiatric: appropriate judgment and insight, oriented to person, place, and time   Lab Results:  Recent Labs    07/19/22 1509 07/20/22 0543  WBC 11.5* 12.5*  HGB 8.7* 8.0*  HCT 26.0* 23.4*  PLT 570* 530*    BMET Recent Labs    07/19/22 1509 07/20/22 0543  NA 132* 133*  K 4.5 4.5  CL 104 101  CO2 23 24  GLUCOSE 138* 134*  BUN 30* 38*  CREATININE 1.62* 1.62*  CALCIUM 8.0* 8.2*    PT/INR No results for input(s): "LABPROT", "INR" in the last 72 hours. ABG No results for input(s): "PHART", "HCO3" in the last 72 hours.  Invalid input(s): "PCO2", "PO2"  Studies/Results: DG Abd Portable 1V  Result Date: 07/19/2022 CLINICAL DATA:   Follow-up ileus EXAM: PORTABLE ABDOMEN - 1 VIEW COMPARISON:  07/09/2022, 07/08/2022 FINDINGS: Scattered large and small bowel gas is noted. Mild small bowel dilatation is noted similar to that seen on prior exams. No free air is seen. No bony abnormality is noted. IMPRESSION: Mild persistent small bowel dilatation stable from the prior exam. Electronically Signed   By: Inez Catalina M.D.   On: 07/19/2022 11:19    Anti-infectives: Anti-infectives (From admission, onward)    Start     Dose/Rate Route Frequency Ordered Stop   07/10/22 0400  piperacillin-tazobactam (ZOSYN) IVPB 3.375 g  Status:  Discontinued        3.375 g 12.5 mL/hr over 240 Minutes Intravenous Every 8 hours 07/09/22 2040 07/10/22 0738   07/08/22 1100  piperacillin-tazobactam (ZOSYN) IVPB 3.375 g  Status:  Discontinued        3.375 g 12.5 mL/hr over 240 Minutes Intravenous Every 8 hours 07/08/22 1001 07/09/22 2040   07/08/22 1000  erythromycin 250 mg in sodium chloride 0.9 % 100 mL IVPB  Status:  Discontinued        250 mg 100 mL/hr over 60 Minutes Intravenous Every 12 hours 07/08/22 0739 07/09/22 0730   07/06/22 0915  erythromycin 250 mg in sodium chloride 0.9 % 100 mL IVPB  Status:  Discontinued        250 mg 100 mL/hr over 60 Minutes Intravenous Every 8 hours 07/06/22 0823 07/08/22 0739   07/03/22 1700  piperacillin-tazobactam (ZOSYN) IVPB 3.375 g        3.375  g 12.5 mL/hr over 240 Minutes Intravenous Every 8 hours 07/03/22 1612 07/08/22 0450   07/02/22 0000  cefoTEtan (CEFOTAN) 2 g in sodium chloride 0.9 % 100 mL IVPB        2 g 200 mL/hr over 30 Minutes Intravenous Every 12 hours 07/01/22 1737 07/02/22 0051   07/01/22 1400  neomycin (MYCIFRADIN) tablet 1,000 mg  Status:  Discontinued       See Hyperspace for full Linked Orders Report.   1,000 mg Oral 3 times per day 07/01/22 1054 07/01/22 1109   07/01/22 1400  metroNIDAZOLE (FLAGYL) tablet 1,000 mg  Status:  Discontinued       See Hyperspace for full Linked Orders  Report.   1,000 mg Oral 3 times per day 07/01/22 1054 07/01/22 1109   07/01/22 1100  cefoTEtan (CEFOTAN) 2 g in sodium chloride 0.9 % 100 mL IVPB        2 g 200 mL/hr over 30 Minutes Intravenous On call to O.R. 07/01/22 1054 07/01/22 1204       Assessment/Plan: s/p Procedure(s): ROBOTIC OSTOMY TAKEDOWN WITH INTRACORPOREAL ANASTOMOSIS WITH FIREFLY (N/A) LYSIS OF ADHESION (N/A) HERNIA REPAIR INGUINAL INCARCERATED (N/A) Dr. Johney Maine 2/22 POD 19 - WBC this am 12.5, afebrile. This is overall improved from prior. Last CT 3/1 without abscess or leak. Repeat CBC in am - still having symptoms of ileus and not taking enough PO.  Encouraged ensure/breeze. Cont scheduled reglan. Cont TPN. Decrease diet if worsening n/v - no BM since 3/8 - added colace bid and miralax qd . Encouraged ambulation. Discussed pain control and add scheduled tylenol and robaxin to try to limit oxycodone prn. -planning for SNF once PO intake improves  Hospital Delirium - limit IV pain medications  FEN: reg, TNA '@50'$  ml/hr, ensure/breeze ID: none currently VTE: lovenox  Transaminitis - in setting of TPN HLD Epilepsy HTN T2DM   LOS: 19 days    Winferd Humphrey, [facility] 07/20/2022, 8:45 AM Please see Amion for pager number during day hours 7:00am-4:30pm

## 2022-07-20 NOTE — Progress Notes (Signed)
OT Cancellation Note  Patient Details Name: MARCQUES WEGHORST MRN: [id number] DOB: Aug 06, 1955   Cancelled Treatment:    Reason Eval/Treat Not Completed: Fatigue/lethargy limiting ability to participate Patient reported having recently gotten back to bed after increased time OOB today. OT to continue to follow and check back as schedule will allow. [name], MS Acute Rehabilitation Department Office# [phone]  07/20/2022, 1:56 PM

## 2022-07-20 NOTE — Progress Notes (Signed)
PHARMACY - TOTAL PARENTERAL NUTRITION CONSULT NOTE   Indication: Prolonged ileus  Patient Measurements: Height: '5\' 8"'$  (172.7 cm) Weight: 94.8 kg (208 lb 15.9 oz) IBW/kg (Calculated) : 68.4 TPN AdjBW (KG): 73.1 Body mass index is 31.78 kg/m. Usual Weight:   Assessment: 67 yo male with hx colon cancer diagnosed 2022 s/p transverse colectomy admitted for elective robotic ostomy takedown with lysis of adhesions, incarcerated incisional hernia repair on 07/01/22. Post-op course complicated by right lower lobe collapse and hypoxemia. Pharmacy consulted for TPN on 2/29 d/t prolonged ileus.  Glucose / Insulin: Hx DM2 but A1c well controlled at 5.1 as of 2/22 - CBGs consistently < 180 w/ minimal SSI use >> 3/10 SSI/monitoring was discontinued Electrolytes: Na low but stable (at max in TPN 12mq/L), others stable WNL  Renal: CKD3 (baseline SCr ~1.5) - SCr stable at baseline; BUN elevated but stable; UOP remains adequate Hepatic: AST/ALT bumped to 6-7 x ULN on 3/8 but continues to trend down now.  - TG, T.bili stable WNL Intake / Output: NG out; last charted BM 3/8 - needs improved PO intake per Surgery before can discharge to SNF - MIVF: NS @ [facility] GI Imaging: - 2/27 AXR: improving postop pneumoperitoneum - 2/29 AXR: possible ileus  - 3/1 CT a/p: difficult to r/o anastomotic leak d/t contrast not reaching anastomosis, but no signs of abscess GI Surgeries / Procedures:  - 2/22: robotic colostomy takedown and lysis of adhesions  Central access: implanted port. Double lumen PICC placed 3/2 TPN start date: 2/29  Nutritional Goals: Goal TPN rate is 100 mL/hr (provides 130 g of protein, 360 g dextrose, and 2486 kcals per day)  RD Assessment: pending Estimated Needs Total Energy Estimated Needs: 2400-2600 kcals Total Protein Estimated Needs: 125-140 grams Total Fluid Estimated Needs: >/= 2.4L  Current Nutrition:  Regular diet - eating but needs to continue to improve Boost/Breeze TID per  RD  TPN at 1/2 rate  Plan:  At 1800: Continue TPN at 1/2 rate (50 mL/hr); per surgery, patient needs to be eating >50% before can discharge patient to SNF  Electrolytes in TPN: no changes; con't Na at max Na 154 mEq/L  K 15 mEq/L Ca 0 mEq/L Mg 4 mEq/L Phos 10 mmol/L Cl:Ac 1:1 Continue enteral MVI as he is tolerating all PO meds.  No further SSI/CBG checks w/ well[facility] Further management by MD Monitor TPN labs on Mon/Thurs   JAdrian Saran PharmD, BCPS Secure Chat if ?s 07/20/2022 10:10 AM

## 2022-07-21 ENCOUNTER — Encounter (HOSPITAL_COMMUNITY): Payer: Self-pay | Admitting: Surgery

## 2022-07-21 ENCOUNTER — Inpatient Hospital Stay (HOSPITAL_COMMUNITY): Payer: Medicare PPO

## 2022-07-21 DIAGNOSIS — C184 Malignant neoplasm of transverse colon: Secondary | ICD-10-CM | POA: Diagnosis not present

## 2022-07-21 MED ORDER — METOPROLOL TARTRATE 5 MG/5ML IV SOLN: 5.0000 mg | Freq: Four times a day (QID) | INTRAVENOUS | Status: DC | PRN

## 2022-07-21 MED ORDER — SODIUM CHLORIDE (PF) 0.9 % IJ SOLN
INTRAMUSCULAR | Status: AC
  Filled 2022-07-21: qty 50

## 2022-07-21 MED ORDER — IOHEXOL 9 MG/ML PO SOLN
500.0000 mL | ORAL | Status: AC
  Administered 2022-07-21 (×2): 500 mL via ORAL

## 2022-07-21 MED ORDER — POLYETHYLENE GLYCOL 3350 17 G PO PACK: 17.0000 g | PACK | Freq: Two times a day (BID) | ORAL | Status: DC | PRN

## 2022-07-21 MED ORDER — METOCLOPRAMIDE HCL 10 MG PO TABS
10.0000 mg | ORAL_TABLET | Freq: Three times a day (TID) | ORAL | Status: DC
  Administered 2022-07-21 (×2): 10 mg via ORAL
  Filled 2022-07-21 (×2): qty 1

## 2022-07-21 MED ORDER — TRAVASOL 10 % IV SOLN
INTRAVENOUS | Status: AC
  Filled 2022-07-21: qty 684

## 2022-07-21 MED ORDER — IOHEXOL 300 MG/ML  SOLN
100.0000 mL | Freq: Once | INTRAMUSCULAR | Status: AC | PRN
  Administered 2022-07-21: 75 mL via INTRAVENOUS

## 2022-07-21 MED ORDER — BISACODYL 10 MG RE SUPP
10.0000 mg | Freq: Every day | RECTAL | Status: DC
  Administered 2022-07-23 – 2022-07-27 (×3): 10 mg via RECTAL
  Filled 2022-07-21 (×6): qty 1

## 2022-07-21 MED ORDER — METOCLOPRAMIDE HCL 5 MG/ML IJ SOLN
5.0000 mg | Freq: Three times a day (TID) | INTRAMUSCULAR | Status: DC | PRN
  Administered 2022-07-22: 10 mg via INTRAVENOUS
  Filled 2022-07-21: qty 2

## 2022-07-21 NOTE — Progress Notes (Signed)
PHARMACY - TOTAL PARENTERAL NUTRITION CONSULT NOTE   Indication: Prolonged ileus  Patient Measurements: Height: '5\' 8"'$  (172.7 cm) Weight: 94.8 kg (208 lb 15.9 oz) IBW/kg (Calculated) : 68.4 TPN AdjBW (KG): 73.1 Body mass index is 31.78 kg/m.  Assessment: 67 yo male with hx colon cancer diagnosed 2022 s/p transverse colectomy admitted for elective robotic ostomy takedown with lysis of adhesions, incarcerated incisional hernia repair on 07/01/22. Post-op course complicated by right lower lobe collapse and hypoxemia. Pharmacy consulted for TPN on 2/29 d/t prolonged ileus.  Glucose / Insulin: Hx DM2 but A1c well controlled at 5.1 as of 2/22 -CBG goal: 100-180. BG at goal with no SSI required for >48 hrs. CBG checks/SSI discontinued on 3/10 Electrolytes: No new labs today.  -Na has been slightly low despite on max Na concentration in TPN Renal: CKD3 (baseline SCr ~1.5) - SCr stable at baseline; BUN elevated but stable; UOP remains adequate Hepatic: LFTs slightly elevated. Tbili, Alk Phos, TG WNL -AST/ALT bumped to 6-7 x ULN on 3/8 but continues to trend down Intake / Output: I/O Net: -515 mL -UOP: 1750 mL, Emesis: 350 mL -MIVF: NS @ [facility] GI Imaging: - 2/27 AXR: improving postop pneumoperitoneum - 2/29 AXR: possible ileus  - 3/1 CT a/p: difficult to r/o anastomotic leak d/t contrast not reaching anastomosis, but no signs of abscess - 3/13: CT Abdomen Pelvis ordered GI Surgeries / Procedures:  - 2/22: robotic colostomy takedown and lysis of adhesions  Central access: implanted port. Double lumen PICC placed 3/2 TPN start date: 2/29  Nutritional Goals: Goal TPN rate is 100 mL/hr (provides 130 g of protein, 360 g dextrose, and 2486 kcals per day)  RD Assessment: pending Estimated Needs Total Energy Estimated Needs: 2400-2600 kcals Total Protein Estimated Needs: 125-140 grams Total Fluid Estimated Needs: >/= 2.4L  Current Nutrition: Plan to discharge to SNF once tolerating PO intake  and meeting >50% of needs -Clear liquid diet -TPN at half-rate (instructed to reduce rate on 3/7 by CCS)  Pt having recurrent N/V. MD ordered CT of abdomen/pelvis. Made NPO overnight, but diet now advanced back to clear liquid.  Plan:  At 1800: Continue TPN at 50 mL/hr (half of goal rate) Will continue at reduced rate unless instructed by CCS to increase back to goal Provides 68 g protein, 1205 kcal Electrolytes in TPN: No changes Na 154 mEq/L (max concentration) K 15 mEq/L Ca 0 mEq/L Mg 4 mEq/L Phos 10 mmol/L Cl:Ac 1:1 Add MVI and trace elements back to TPN (has been taking PO)  No further CBG check/SSI at this time. Adjust as needed.  [facility]F at [facility]. Further management by MD. Monitor TPN labs on Mon/Thurs  Lenis Noon, PharmD 07/21/22 9:02 AM

## 2022-07-21 NOTE — TOC Progression Note (Signed)
Transition of Care [facility]) - Progression Note   Patient Details  Name: Thomas [name] MRN: [id number] Date of Birth: 1955/12/19  Transition of Care [facility]) CM/SW [place], LCSW Phone Number: 07/21/2022, 11:51 AM  Clinical Narrative: TOC awaiting patient to be weaned off TPN so that insurance authorization can be started.  Expected Discharge Plan: Skilled Nursing Facility Barriers to Discharge: Continued Medical Work up, SNF Pending bed offer, Ship broker  Expected Discharge Plan and Services In-house Referral: NA Discharge Planning Services: CM Consult Post Acute Care Choice: [place] Living arrangements for the past 2 months: Single Family Home            DME Arranged: N/A DME Agency: NA HH Arranged: PT, Nurse's Aide  Social Determinants of Health (SDOH) Interventions SDOH Screenings   Food Insecurity: No Food Insecurity (07/01/2022)  Housing: Low Risk  (07/01/2022)  Transportation Needs: No Transportation Needs (07/01/2022)  Utilities: Not At Risk (07/01/2022)  Depression (PHQ2-9): Low Risk  (04/28/2021)  Tobacco Use: Medium Risk (07/09/2022)   Readmission Risk Interventions    07/07/2022    5:56 PM 02/16/2021   10:08 AM  Readmission Risk Prevention Plan  Transportation Screening Complete Complete  PCP or Specialist Appt within 3-5 Days Complete   HRI or [place] Complete   Social Work Consult for [place] Planning/Counseling [place] Not Applicable   Medication Review Press photographer) Complete Complete  PCP or Specialist appointment within 3-5 days of discharge  Complete  HRI or [place]  Complete  SW Recovery Care/Counseling Consult  Complete  Palliative Care Screening  Complete  Skilled Nursing Facility  Complete

## 2022-07-21 NOTE — Progress Notes (Signed)
Thomas [name] Jul 23, 1955  CARE TEAM:  PCP: [name], NP  Outpatient Care Team: Patient Care Team: [name], NP as PCP - General Benito Mccreedy, MD (Internal Medicine) Patient, No Pcp Per (General Practice) Michael Boston, MD as Consulting Physician (Colon and Rectal Surgery) Irene Shipper, MD as Consulting Physician (Gastroenterology) Ladell Pier, MD as Consulting Physician (Oncology)  Inpatient Treatment Team: Treatment Team: Attending Provider: Michael Boston, MD; Consulting Physician: Alma Friendly, MD; Rounding Team: Fatima Blank, MD; Consulting Physician: Nolon Nations, MD; Student Nurse: Rhymer, Jerilynn Som, Student-RN; Charge Nurse: Sunny Schlein, RN; Technician: Jeralyn Ruths, NT; Occupational Therapist: [name], OT; Registered Nurse: Ames Dura, RN; Pharmacist: Lenis Noon, [facility]; Utilization Review: Antionette Char, RN   Problem List:   Principal Problem:   Colon cancer [facility]) Active Problems:   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   History of ischemic left MCA stroke 2015   Type 2 diabetes mellitus with diabetic nephropathy, without long-term current use of insulin ([place])   Hypertension   Protein-calorie malnutrition, moderate ([place])   Epilepsy ([place])   Hyperlipidemia   Delirium   Hypokalemia   Incisional hernia, without obstruction or gangrene   CKD (chronic kidney disease) stage 3b   Hypophosphatemia   Normocytic anemia   Atelectasis   20 Days Post-Op  07/01/2022  POST-OPERATIVE DIAGNOSIS:   COLOSTOMY FOR COLON RESECTION INCARCERATED INCISIONAL HERNIA   PROCEDURE:   TAKEDOWN OF END COLOSTOMY WITH ANASTOMOSIS INTRAOPERATIVE ASSESSMENT OF TISSUE VASCULAR PERFUSION USING ICG (indocyanine green) IMMUNOFLUORESCENCE TRANSVERSUS ABDOMINIS PLANE (TAP) BLOCK - BILATERAL LYSIS OF ADHESIONS x90 MINUTES (50% OF CASE) PRIMARY REPAIR OF INCARCERATED INCISIONAL HERNIA    SURGEON:  Adin Hector, MD  OR FINDINGS:    Patient had very dense intra-abdominal adhesions of small bowel to all quadrants of the abdomen as well as colon.  He had a loop of small bowel incarcerated in a periumbilical midline incisional hernia 4 x 4 cm.  Reduced and primarily repaired.   No obvious metastatic disease on visceral parietal peritoneum or liver.   It is an isoperistaltic ileocolonic anastomosis (proximal transverse colon to distal transverse colon) that rests in the epigastric region.    Assessment  Recurrent emesis  [facility] Stay = 20 days)  Plan:  Recurrent nausea vomiting disconcerting.  Seems distended again.  CT scan 3/1 showed no abscess undrained fluid collection or delayed leak.   Some mild leukocytosis but no fevers or not worsening. Films 2 days ago imply just ileus.  However with recurrent abscess in this high risk patient, will repeat CAT scan to make sure there is no delayed abscess or other concern.  If evidence obstruction then NG tube and bowel rest and regroup.  If eating better and nutritional numbers better, wean off TPN and perhaps discharge to skilled nurse facility.  Recheck prealbumin every week.    ICU delirium resolved.  Question of some sundowning at night but is alert this morning.  Appreciate medical help.    Follow electrolytes closely. He has had issues with hypokalemia & hypophosphatemia.  Stable for now.  Chronic kidney disease but nonoliguric.  Try and keep on the dry side as possible.  Pulmonary status seems to be stable.   Nebulizers as needed.  Medicine following.  Some acute blood loss anemia in setting of chronic anemia of chronic disease and iron deficiency anemia.  Hgb stable.  Not hypotensive.  Follow.  -VTE prophylaxis- SCDs, etc  -  mobilize as tolerated to help recovery.  Rather deconditioned but was able to mobilize in a with nursing and therapy.  Starting to walk in the hallways more regularly.  Guardedly hopeful  sign.  I agree he would benefit from rehab at a skilled facility with more aggressive therapies.  Plans to go to skilled nursing facility.  Hopefully if he has adequate oral intake and is doing better could leave may be as soon as tomorrow.  We will see.     Disposition:  The patient is from: Home  Anticipate discharge to:  [facility] (SNF)  Anticipated Date of Discharge is:  March 16,2024    Barriers to discharge:  Pending Clinical improvement (more likely than not)  Patient currently is NOT MEDICALLY STABLE for discharge from the hospital from a surgery standpoint.   I updated the patient's status to the patient and nurse at bedside & ICU RNs just outside room.  Recommendations were made.  Questions were answered.  They expressed understanding & appreciation.    I reviewed nursing notes, last 24 h vitals and pain scores, last 48 h intake and output, last 24 h labs and trends, and last 24 h imaging results.  Reviewed notes with respiratory physical therapies as well as critical care nursing.  I have reviewed this patient's available data, including medical history, events of note, test results, etc as part of my evaluation.  A significant portion of that time was spent in counseling.  Care during the described time interval was provided by me.  This care required moderate level of medical decision making.  07/21/2022    Subjective: (Chief complaint)  Status updated.  Patient with emesis last night made NPO.  Patient sitting up in chair.  Denies much nausea now.  Not hiccuping now.    He's understandably frustrated.  Claims to be walking better but hard to tell.  Notes only mild soreness w activity at old colostomy site.  Nurse in room.  No bowel movements despite given stool softeners on top of FiberCon.  Objective:  Vital signs:  Vitals:   07/19/22 2204 07/20/22 0552 07/20/22 2131 07/21/22 0605  BP: 138/75 124/63 (!) 146/72 130/72  Pulse: 91 89 93 93   Resp: '16 18 18 18  '$ Temp: 98.5 F (36.9 C) 98.6 F (37 C) 98.6 F (37 C) 97.7 F (36.5 C)  TempSrc: Oral Oral Oral Oral  SpO2: 96% 94% 95% 94%  Weight:      Height:        Last BM Date : 07/14/22  Intake/Output   Yesterday:  03/12 0701 - 03/13 0700 In: 1234.4 [I.V.:1234.4] Out: [id number] [Urine:1750] This shift:  No intake/output data recorded.  Bowel function:  Flatus: YES  BM:  No  Drain: (No drain)   Physical Exam:  General: Pt awake and alert.  no acute distress.  Not toxic or sickly.  Mildly frustrated but consolable.   Eyes: PERRL, normal EOM.  Sclera clear.  No icterus Neuro: CN II-XII intact w/o focal sensory/motor deficits. Lymph: No head/neck/groin lymphadenopathy Psych:  No delerium/psychosis/paranoia.  Oriented x 4.  No confusion or anxiety today. HENT: Normocephalic, Mucus membranes moist.  No thrush Neck: Supple, No tracheal deviation.  No obvious thyromegaly Chest: No pain to chest wall compression.  Good respiratory excursion.  No audible wheezing CV:  Pulses intact.  Regular rhythm.  No major extremity edema MS: Normal AROM mjr joints.  No obvious deformity  Abdomen: Obese but  Soft.  Moderately distended.  Nontender .  Right upper quadrant old colostomy wound closed and dry  Normal healing ridge.  No purulence or cellulitis.  No guarding nor evidence of peritonitis.  No incarcerated hernias.  Rectal: Flexi-Seal in place with thin gray/brown effluent Ext:   No deformity.  No mjr edema.  No cyanosis Skin: No petechiae / purpurea.  No major sores.  Warm and dry    Results:   Cultures: Recent Results (from the past 720 hour(s))  MRSA Next Gen by PCR, Nasal     Status: None   Collection Time: 07/03/22  3:16 PM   Specimen: Nasal Mucosa; Nasal Swab  Result Value Ref Range Status   MRSA by PCR Next Gen NOT DETECTED NOT DETECTED Final    Comment: (NOTE) The GeneXpert MRSA Assay (FDA approved for NASAL specimens only), is one component of a  comprehensive MRSA colonization surveillance program. It is not intended to diagnose MRSA infection nor to guide or monitor treatment for MRSA infections. Test performance is not FDA approved in patients less than 26 years old. Performed at [facility], [place] [address] [place], [place] [address]   Culture, blood (Routine X 2) w Reflex to ID Panel     Status: None   Collection Time: 07/11/22  9:57 AM   Specimen: BLOOD  Result Value Ref Range Status   Specimen Description   Final    BLOOD BLOOD LEFT ARM Performed at [place] [address] [place], [place] [address]    Special Requests   Final    BOTTLES DRAWN AEROBIC AND ANAEROBIC Blood Culture adequate volume Performed at [place] [address] [place] [address], [place], [place] [address]    Culture   Final    NO GROWTH 5 DAYS Performed at [facility], [place] [address] [place], [place] [address]    Report Status 07/16/2022 FINAL  Final  Culture, blood (Routine X 2) w Reflex to ID Panel     Status: None   Collection Time: 07/11/22  9:57 AM   Specimen: BLOOD  Result Value Ref Range Status   Specimen Description   Final    BLOOD BLOOD LEFT HAND Performed at [place] [address] [place], [place] [address]    Special Requests   Final    BOTTLES DRAWN AEROBIC AND ANAEROBIC Blood Culture adequate volume Performed at [place] [address] [place], [place] [address]    Culture   Final    NO GROWTH 5 DAYS Performed at [facility], [place] [address] [place], [place] [address]    Report Status 07/16/2022 FINAL  Final  C Difficile Quick Screen (NO PCR Reflex)     Status: None   Collection Time: 07/12/22  8:24 AM   Specimen: STOOL  Result Value Ref Range Status   C Diff antigen NEGATIVE NEGATIVE Final   C Diff toxin NEGATIVE NEGATIVE Final   C Diff interpretation No C. difficile detected.  Final    Comment:  Performed at [facility], [place] [address] [place], [place] [address]  Gastrointestinal Panel by PCR , Stool     Status: None   Collection Time: 07/12/22  8:24 AM   Specimen: STOOL  Result Value Ref Range Status   Campylobacter species NOT DETECTED NOT DETECTED Final   Plesimonas shigelloides NOT DETECTED NOT DETECTED Final   Salmonella species NOT DETECTED NOT DETECTED Final   Yersinia enterocolitica NOT DETECTED NOT  DETECTED Final   Vibrio species NOT DETECTED NOT DETECTED Final   Vibrio cholerae NOT DETECTED NOT DETECTED Final   Enteroaggregative E coli (EAEC) NOT DETECTED NOT DETECTED Final   Enteropathogenic E coli (EPEC) NOT DETECTED NOT DETECTED Final   Enterotoxigenic E coli (ETEC) NOT DETECTED NOT DETECTED Final   Shiga like toxin producing E coli (STEC) NOT DETECTED NOT DETECTED Final   Shigella/Enteroinvasive E coli (EIEC) NOT DETECTED NOT DETECTED Final   Cryptosporidium NOT DETECTED NOT DETECTED Final   Cyclospora cayetanensis NOT DETECTED NOT DETECTED Final   Entamoeba histolytica NOT DETECTED NOT DETECTED Final   Giardia lamblia NOT DETECTED NOT DETECTED Final   Adenovirus F40/41 NOT DETECTED NOT DETECTED Final   Astrovirus NOT DETECTED NOT DETECTED Final   Norovirus GI/GII NOT DETECTED NOT DETECTED Final   Rotavirus A NOT DETECTED NOT DETECTED Final   Sapovirus (I, II, IV, and V) NOT DETECTED NOT DETECTED Final    Comment: Performed at [facility], [address] [place], [place] [address]    Labs: Results for orders placed or performed during the hospital encounter of 07/01/22 (from the past 48 hour(s))  CBC     Status: Abnormal   Collection Time: 07/19/22  3:09 PM  Result Value Ref Range   WBC 11.5 (H) 4.0 - 10.5 K/uL   RBC 3.43 (L) 4.22 - 5.81 MIL/uL   Hemoglobin 8.7 (L) 13.0 - 17.0 g/dL    Comment: Reticulocyte Hemoglobin testing may be clinically indicated, consider ordering this additional test [id number]    HCT 26.0 (L) 39.0  - 52.0 %   MCV 75.8 (L) 80.0 - 100.0 fL   MCH 25.4 (L) 26.0 - 34.0 pg   MCHC 33.5 30.0 - 36.0 g/dL   RDW 15.0 11.5 - 15.5 %   Platelets 570 (H) 150 - 400 K/uL   nRBC 0.0 0.0 - 0.2 %    Comment: Performed at [facility], [place] [address], [place], [place] [address]  Comprehensive metabolic panel     Status: Abnormal   Collection Time: 07/19/22  3:09 PM  Result Value Ref Range   Sodium 132 (L) 135 - 145 mmol/L   Potassium 4.5 3.5 - 5.1 mmol/L   Chloride 104 98 - 111 mmol/L   CO2 23 22 - 32 mmol/L   Glucose, Bld 138 (H) 70 - 99 mg/dL    Comment: Glucose reference range applies only to samples taken after fasting for at least 8 hours.   BUN 30 (H) 8 - 23 mg/dL   Creatinine, Ser 1.62 (H) 0.61 - 1.24 mg/dL   Calcium 8.0 (L) 8.9 - 10.3 mg/dL   Total Protein 7.6 6.5 - 8.1 g/dL   Albumin 2.4 (L) 3.5 - 5.0 g/dL   AST 51 (H) 15 - 41 U/L   ALT 124 (H) 0 - 44 U/L   Alkaline Phosphatase 136 (H) 38 - 126 U/L   Total Bilirubin 0.6 0.3 - 1.2 mg/dL   GFR, Estimated 47 (L) >60 mL/min    Comment: (NOTE) Calculated using the CKD-EPI Creatinine Equation (2021)    Anion gap 5 5 - 15    Comment: Performed at [facility], [place] [address], [place], [place] [address]  CBC     Status: Abnormal   Collection Time: 07/20/22  5:43 AM  Result Value Ref Range   WBC 12.5 (H) 4.0 - 10.5 K/uL   RBC 3.09 (L) 4.22 - 5.81 MIL/uL   Hemoglobin 8.0 (L)  13.0 - 17.0 g/dL    Comment: Reticulocyte Hemoglobin testing may be clinically indicated, consider ordering this additional test [id number]    HCT 23.4 (L) 39.0 - 52.0 %   MCV 75.7 (L) 80.0 - 100.0 fL   MCH 25.9 (L) 26.0 - 34.0 pg   MCHC 34.2 30.0 - 36.0 g/dL   RDW 15.1 11.5 - 15.5 %   Platelets 530 (H) 150 - 400 K/uL   nRBC 0.0 0.0 - 0.2 %    Comment: Performed at [facility], [place] [address] [place], [place] [address]  Comprehensive metabolic panel     Status: Abnormal   Collection Time: 07/20/22  5:43  AM  Result Value Ref Range   Sodium 133 (L) 135 - 145 mmol/L   Potassium 4.5 3.5 - 5.1 mmol/L   Chloride 101 98 - 111 mmol/L   CO2 24 22 - 32 mmol/L   Glucose, Bld 134 (H) 70 - 99 mg/dL    Comment: Glucose reference range applies only to samples taken after fasting for at least 8 hours.   BUN 38 (H) 8 - 23 mg/dL   Creatinine, Ser 1.62 (H) 0.61 - 1.24 mg/dL   Calcium 8.2 (L) 8.9 - 10.3 mg/dL   Total Protein 7.1 6.5 - 8.1 g/dL   Albumin 2.3 (L) 3.5 - 5.0 g/dL   AST 42 (H) 15 - 41 U/L   ALT 101 (H) 0 - 44 U/L   Alkaline Phosphatase 117 38 - 126 U/L   Total Bilirubin 0.5 0.3 - 1.2 mg/dL   GFR, Estimated 47 (L) >60 mL/min    Comment: (NOTE) Calculated using the CKD-EPI Creatinine Equation (2021)    Anion gap 8 5 - 15    Comment: Performed at [facility], [place] [address], [place],  [address]    Imaging / Studies: DG Abd Portable 1V  Result Date: 07/19/2022 CLINICAL DATA:  Follow-up ileus EXAM: PORTABLE ABDOMEN - 1 VIEW COMPARISON:  07/09/2022, 07/08/2022 FINDINGS: Scattered large and small bowel gas is noted. Mild small bowel dilatation is noted similar to that seen on prior exams. No free air is seen. No bony abnormality is noted. IMPRESSION: Mild persistent small bowel dilatation stable from the prior exam. Electronically Signed   By: Inez Catalina M.D.   On: 07/19/2022 11:19    Medications / Allergies: per chart  Antibiotics: Anti-infectives (From admission, onward)    Start     Dose/Rate Route Frequency Ordered Stop   07/10/22 0400  piperacillin-tazobactam (ZOSYN) IVPB 3.375 g  Status:  Discontinued        3.375 g 12.5 mL/hr over 240 Minutes Intravenous Every 8 hours 07/09/22 2040 07/10/22 0738   07/08/22 1100  piperacillin-tazobactam (ZOSYN) IVPB 3.375 g  Status:  Discontinued        3.375 g 12.5 mL/hr over 240 Minutes Intravenous Every 8 hours 07/08/22 1001 07/09/22 2040   07/08/22 1000  erythromycin 250 mg in sodium chloride 0.9 % 100 mL IVPB   Status:  Discontinued        250 mg 100 mL/hr over 60 Minutes Intravenous Every 12 hours 07/08/22 0739 07/09/22 0730   07/06/22 0915  erythromycin 250 mg in sodium chloride 0.9 % 100 mL IVPB  Status:  Discontinued        250 mg 100 mL/hr over 60 Minutes Intravenous Every 8 hours 07/06/22 0823 07/08/22 0739   07/03/22 1700  piperacillin-tazobactam (ZOSYN) IVPB 3.375 g  3.375 g 12.5 mL/hr over 240 Minutes Intravenous Every 8 hours 07/03/22 1612 07/08/22 0450   07/02/22 0000  cefoTEtan (CEFOTAN) 2 g in sodium chloride 0.9 % 100 mL IVPB        2 g 200 mL/hr over 30 Minutes Intravenous Every 12 hours 07/01/22 1737 07/02/22 0051   07/01/22 1400  neomycin (MYCIFRADIN) tablet 1,000 mg  Status:  Discontinued       See Hyperspace for full Linked Orders Report.   1,000 mg Oral 3 times per day 07/01/22 1054 07/01/22 1109   07/01/22 1400  metroNIDAZOLE (FLAGYL) tablet 1,000 mg  Status:  Discontinued       See Hyperspace for full Linked Orders Report.   1,000 mg Oral 3 times per day 07/01/22 1054 07/01/22 1109   07/01/22 1100  cefoTEtan (CEFOTAN) 2 g in sodium chloride 0.9 % 100 mL IVPB        2 g 200 mL/hr over 30 Minutes Intravenous On call to O.R. 07/01/22 1054 07/01/22 1204         Note: Portions of this report may have been transcribed using voice recognition software. Every effort was made to ensure accuracy; however, inadvertent computerized transcription errors may be present.   Any transcriptional errors that result from this process are unintentional.    Adin Hector, MD, FACS, MASCRS Esophageal, Gastrointestinal & Colorectal Surgery Robotic and Minimally Invasive Surgery  [facility]. [address], [place], [place] [address] [phone] Fax [phone] Main  CONTACT INFORMATION:  Weekday (9AM-5PM): Call [facility] main office at [phone]  Weeknight (5PM-9AM) or Weekend/Holiday: Check [email or web address]  (password " [email or web address]") for General Surgery [facility] coverage  (Please, do not use SecureChat as it is not reliable communication to reach operating surgeons for immediate patient care given surgeries/outpatient duties/clinic/cross-coverage/off post-call which would lead to a delay in care.  Epic staff messaging available for outptient concerns, but may not be answered for 48 hours or more).     07/21/2022  7:53 AM

## 2022-07-21 NOTE — Progress Notes (Signed)
Occupational Therapy Treatment Patient Details Name: Thomas [name] MRN: [id number] DOB: Apr 08, 1956 Today's Date: 07/21/2022   History of present illness Patient is a 67 yo male admitted 07/01/22  S/P LYSIS OF ADHESIONS , PRIMARY REPAIR OF INCARCERATED INCISIONAL HERNIA , TAKEDOWN OF END COLOSTOMY WITH ANASTOMOSIS.2/24 moved to stepdown unit for hypoxemia, right lower lobe collapse CT chest showed debris in right lower lobe with atelectasis. Now with post op ileus, asp pneumonitis. PMH, colon and rectal  cancer, colostomy, VDRF with trach 2022,   DVT, afib, DM, HTN, seizure, CVA, fromtal meningioma, lobectomy   OT comments  Patient was educated on using AE for LB dressing to reduce pain in abdominal area. Patient verbalized and demonstrated understanding with min A sitting EOB. Patient would need more practice to become more effective at method. Patient with increased pain/discomfort in abdomen impacted further participation in session. Patient's discharge plan remains appropriate at this time. OT will continue to follow acutely.     Recommendations for follow up therapy are one component of a multi-disciplinary discharge planning process, led by the attending physician.  Recommendations may be updated based on patient status, additional functional criteria and insurance authorization.    Follow Up Recommendations  Skilled nursing-short term rehab (<3 hours/day)     Assistance Recommended at Discharge Frequent or constant Supervision/Assistance  Patient can return home with the following  A lot of help with bathing/dressing/bathroom;Assistance with cooking/housework;Direct supervision/assist for medications management;Assist for transportation;Direct supervision/assist for financial management;Help with stairs or ramp for entrance;A little help with walking and/or transfers   Equipment Recommendations  BSC/3in1    Recommendations for Other Services      Precautions / Restrictions  Precautions Precautions: Fall Precaution Comments: abdominal Restrictions Weight Bearing Restrictions: No              ADL either performed or assessed with clinical judgement   ADL Overall ADL's : Needs assistance/impaired                     Lower Body Dressing: Set up;Sitting/lateral leans Lower Body Dressing Details (indicate cue type and reason): patient was educated on using AE for LB dressing. patient was able to doff sock with reacher with min verbal cues and don with sock aid with min A and increased time. patient was educated on simulated task with reacher for pants/undergarments with patient verbalizing and demonstraing understanding.                      Cognition Arousal/Alertness: Awake/alert Behavior During Therapy: WFL for tasks assessed/performed Overall Cognitive Status: Within Functional Limits for tasks assessed                       Pertinent Vitals/ Pain       Pain Assessment Pain Assessment: Faces Faces Pain Scale: Hurts a little bit Pain Location: abdomen, "comes and goes" Pain Descriptors / Indicators: Discomfort, Grimacing, Sore, Operative site guarding Pain Intervention(s): Limited activity within patient's tolerance, Monitored during session         Frequency  Min 2X/week        Progress Toward Goals  OT Goals(current goals can now be found in the care plan section)  Progress towards OT goals: Progressing toward goals     Plan      AM-PAC OT "6 Clicks" Daily Activity     Outcome Measure   Help from another person eating meals?: None Help from another person taking care  of personal grooming?: A Little Help from another person toileting, which includes using toliet, bedpan, or urinal?: A Lot Help from another person bathing (including washing, rinsing, drying)?: A Lot Help from another person to put on and taking off regular upper body clothing?: A Little Help from another person to put on and taking off  regular lower body clothing?: A Lot 6 Click Score: 16    End of Session Equipment Utilized During Treatment: Other (comment) (total hip kit)  OT Visit Diagnosis: Unsteadiness on feet (R26.81);Other abnormalities of gait and mobility (R26.89);Muscle weakness (generalized) (M62.81);Pain   Activity Tolerance     Patient Left in bed;with bed alarm set;with call bell/phone within reach   Nurse Communication Other (comment) (ok to participate in session)        Time: [id number] OT Time Calculation (min): 14 min  Charges: OT General Charges $OT Visit: 1 Visit OT Treatments $Self Care/Home Management : 8-22 mins  [name], MS Acute Rehabilitation Department Office# [phone]   [name] 07/21/2022, 2:06 PM

## 2022-07-21 NOTE — Progress Notes (Signed)
Physical Therapy Treatment Patient Details Name: Thomas [name] MRN: [id number] DOB: 26-Oct-1955 Today's Date: 07/21/2022   History of Present Illness Patient is a 67 yo male admitted 07/01/22  S/P LYSIS OF ADHESIONS , PRIMARY REPAIR OF INCARCERATED INCISIONAL HERNIA , TAKEDOWN OF END COLOSTOMY WITH ANASTOMOSIS.2/24 moved to stepdown unit for hypoxemia, right lower lobe collapse CT chest showed debris in right lower lobe with atelectasis. Now with post op ileus, asp pneumonitis. PMH, colon and rectal  cancer, colostomy, VDRF with trach 2022,   DVT, afib, DM, HTN, seizure, CVA, fromtal meningioma, lobectomy    PT Comments    Norvell is progressing toward goals. Willing to work with PT however reports incr pain this pm despite meds. Activity tolerance improving, pt able to incr distance without rest. Amb from room to transport chair to go for repeat CT scan.  Continue PT POC  Recommendations for follow up therapy are one component of a multi-disciplinary discharge planning process, led by the attending physician.  Recommendations may be updated based on patient status, additional functional criteria and insurance authorization.  Follow Up Recommendations  Skilled nursing-short term rehab (<3 hours/day) Can patient physically be transported by private vehicle: Yes   Assistance Recommended at Discharge Intermittent Supervision/Assistance  Patient can return home with the following Assist for transportation;Assistance with cooking/housework;Help with stairs or ramp for entrance;A little help with walking and/or transfers   Equipment Recommendations  None recommended by PT    Recommendations for Other Services       Precautions / Restrictions Precautions Precautions: Fall Precaution Comments: abdominal Restrictions Weight Bearing Restrictions: No     Mobility  Bed Mobility               General bed mobility comments: OOB in recliner    Transfers Overall transfer level: Needs  assistance Equipment used: Rolling walker (2 wheels) Transfers: Sit to/from Stand Sit to Stand: Min guard           General transfer comment: increased time, cues to power up with LEs.    Ambulation/Gait Ambulation/Gait assistance: Min guard Gait Distance (Feet): 80 Feet Assistive device: Rolling walker (2 wheels) Gait Pattern/deviations: Step-through pattern, Decreased stride length Gait velocity: decr     General Gait Details: initially unsteady noted stability improved with distance. cues for trunk extension as able   Stairs             Wheelchair Mobility    Modified Rankin (Stroke Patients Only)       Balance   Sitting-balance support: No upper extremity supported, Feet supported Sitting balance-Leahy Scale: Fair     Standing balance support: During functional activity, Reliant on assistive device for balance Standing balance-Leahy Scale: Fair                              Cognition Arousal/Alertness: Awake/alert Behavior During Therapy: WFL for tasks assessed/performed Overall Cognitive Status: Within Functional Limits for tasks assessed                                          Exercises      General Comments        Pertinent Vitals/Pain Pain Assessment Pain Assessment: Faces Faces Pain Scale: Hurts even more Pain Location: abd Pain Descriptors / Indicators: Discomfort, Grimacing, Sore, Operative site guarding Pain Intervention(s): Limited activity within patient's  tolerance, Monitored during session, Premedicated before session, Repositioned    Home Living                          Prior Function            PT Goals (current goals can now be found in the care plan section) Acute Rehab PT Goals Patient Stated Goal: go home PT Goal Formulation: With patient Time For Goal Achievement: 08/04/22 Potential to Achieve Goals: Good Progress towards PT goals: Progressing toward goals     Frequency    Min 3X/week      PT Plan Current plan remains appropriate    Co-evaluation              AM-PAC PT "6 Clicks" Mobility   Outcome Measure  Help needed turning from your back to your side while in a flat bed without using bedrails?: A Little Help needed moving from lying on your back to sitting on the side of a flat bed without using bedrails?: A Little Help needed moving to and from a bed to a chair (including a wheelchair)?: A Little Help needed standing up from a chair using your arms (e.g., wheelchair or bedside chair)?: A Little Help needed to walk in hospital room?: A Little Help needed climbing 3-5 steps with a railing? : A Lot 6 Click Score: 17    End of Session Equipment Utilized During Treatment: Gait belt Activity Tolerance: Patient tolerated treatment well Patient left: Other (comment) (transport chair with transporter)   PT Visit Diagnosis: Difficulty in walking, not elsewhere classified (R26.2);Pain Pain - part of body:  (abd)     Time: [id number] PT Time Calculation (min) (ACUTE ONLY): 10 min  Charges:  $Gait Training: 8-22 mins                     Baxter Flattery, PT  Acute Rehab Dept [facility]) [phone]  [facility] Weekend Pager [facility] only)  [phone]  07/21/2022    Gi Or Norman 07/21/2022, 4:10 PM

## 2022-07-21 NOTE — Progress Notes (Addendum)
[facility] Progress Note  Patient: Thomas [name]     ZOX:[id number]  DOA: 07/01/2022   PCP: Hillery Aldo, NP       Brief hospital course: This is a 67 year old male with hypertension, diabetes mellitus, history of stroke seizures, meningioma and colon cancer s/p transverse colectomy admitted for elective robotic ostomy takedown with lysis of adhesions, incarcerated incisional hernia repair on 07/01/22  who was admitted to the hospital after an elective colostomy takedown.  Postop course was complicated by delirium and hypoxia related to atelectasis/ RLL collapse, mucous plugging and aspiration.  [facility] are consultants.  Subjective:   No new complaints.   Assessment and Plan: Principal Problem:   Colon cancer  - General surgery has ordered a CT of the abdomen pelvis with contraston 3/13 which continued to show a post op ileus - diet advanced to solids --patient is on TPN, Reglan 10 mg 3 times daily, daily Dulcolax, as needed Zofran and fentanyl    Active Problems: B/l pleural effusions - noted on CT of abd/pelvis on 3/13 - TPN at 50 cc/hr w/o any additional fluids - has been having good urine output  Delirium - Felt to be secondary to pain medications - She last received Seroquel on 3/9 - Noted to be quite oriented on my exam today - DC'd PRN  Seroquel  Epilepsy - Continue Keppra  Hypertension - BP slightly elevated and he is receiving IV metoprolol 5 mg every 6 hours as needed   CKD stage 3a - follow PRN  Microcytic anemia - Has low Iron stores- will start oral Iron when tolerating a diet  Thrombocytosis - new since 3/1 -  secondary to iron deficiency vs stress?      Code Status: Full Code  Objective:   Vitals:   07/20/22 0552 07/20/22 2131 07/21/22 0605 07/21/22 1239  BP: 124/63 (!) 146/72 130/72 (!) 143/71  Pulse: 89 93 93 89  Resp: 18 18 18 17   Temp: 98.6 F (37 C) 98.6 F (37 C) 97.7 F (36.5 C) 98.4 F (36.9 C)  TempSrc: Oral  Oral Oral Oral  SpO2: 94% 95% 94% 93%  Weight:      Height:       Filed Weights   07/17/22 0748 07/18/22 0500 07/19/22 0708  Weight: 89.8 kg 95 kg 94.8 kg   Exam: General exam: Appears comfortable  HEENT: oral mucosa moist Respiratory system: Clear to auscultation.  Cardiovascular system: S1 & S2 heard  Gastrointestinal system: Abdomen soft, distended, tympanic, Normal bowel sounds   Extremities: No cyanosis, clubbing or edema Psychiatry:  Mood & affect appropriate.     CBC: Recent Labs  Lab 07/15/22 0250 07/17/22 0159 07/19/22 1509 07/20/22 0543  WBC 18.2* 13.8* 11.5* 12.5*  NEUTROABS 14.3*  --   --   --   HGB 8.0* 8.1* 8.7* 8.0*  HCT 23.7* 23.3* 26.0* 23.4*  MCV 76.7* 76.4* 75.8* 75.7*  PLT 550* 565* 570* 530*   Basic Metabolic Panel: Recent Labs  Lab 07/15/22 0250 07/16/22 0248 07/16/22 1022 07/17/22 0159 07/18/22 0148 07/19/22 0241 07/19/22 1509 07/20/22 0543  NA 134* 134*   < > 129* 132* 133* 132* 133*  K 4.3 4.5   < > 4.3 4.5 4.3 4.5 4.5  CL 105 103   < > 102 102 102 104 101  CO2 24 23   < > 23 22 21* 23 24  GLUCOSE 159* 142*   < > 127* 135* 123* 138* 134*  BUN 41* 37*   < >  34* 35* 37* 30* 38*  CREATININE 1.55* 1.45*   < > 1.52* 1.49* 1.59* 1.62* 1.62*  CALCIUM 7.8* 8.2*   < > 7.7* 8.3* 8.3* 8.0* 8.2*  MG 1.8 2.2  --   --   --  1.9  --   --   PHOS 4.2 3.7  --   --   --  3.6  --   --    < > = values in this interval not displayed.   GFR: Estimated Creatinine Clearance: 50.8 mL/min (A) (by C-G formula based on SCr of 1.62 mg/dL (H)).  Scheduled Meds:  acetaminophen  1,000 mg Oral Q6H   allopurinol  100 mg Oral QHS   amLODipine  10 mg Oral q AM   bisacodyl  10 mg Rectal Daily   Chlorhexidine Gluconate Cloth  6 each Topical Daily   enoxaparin (LOVENOX) injection  40 mg Subcutaneous Q24H   feeding supplement  237 mL Oral BID BM   gabapentin  100 mg Oral Daily   gabapentin  200 mg Oral QHS   heparin lock flush  500 Units Intracatheter Q30 days    levETIRAcetam  500 mg Oral BID   lip balm   Topical BID   methocarbamol  1,000 mg Oral Q6H   metoCLOPramide  10 mg Oral TID AC   pantoprazole  40 mg Oral BID AC   polycarbophil  625 mg Oral BID   polyethylene glycol  17 g Oral Daily   sodium chloride (PF)       sodium chloride flush  10-40 mL Intracatheter Q12H   Continuous Infusions:  sodium chloride 10 mL/hr at 07/21/22 1500   sodium chloride Stopped (07/11/22 0753)   TPN ADULT (ION) 50 mL/hr at 07/21/22 1500   TPN ADULT (ION)     Imaging and lab data was personally reviewed No results found.  LOS: 20 days   Author: Calvert Cantor  07/21/2022 5:14 PM  To contact [facility]>   Check the care team in [facility] and look for the attending/consulting [facility] provider listed  Log into [email or web address] and use [place]'s universal password   Go to> "[facility]"  and find provider  If you still have difficulty reaching the provider, please page the [facility] (Director on Call) for the Hospitalists listed on amion

## 2022-07-22 ENCOUNTER — Other Ambulatory Visit (HOSPITAL_COMMUNITY): Payer: Self-pay

## 2022-07-22 DIAGNOSIS — R739 Hyperglycemia, unspecified: Secondary | ICD-10-CM

## 2022-07-22 LAB — COMPREHENSIVE METABOLIC PANEL
ALT: 60 U/L — ABNORMAL HIGH (ref 0–44)
AST: 25 U/L (ref 15–41)
Albumin: 2.2 g/dL — ABNORMAL LOW (ref 3.5–5.0)
Alkaline Phosphatase: 91 U/L (ref 38–126)
Anion gap: 7 (ref 5–15)
BUN: 36 mg/dL — ABNORMAL HIGH (ref 8–23)
CO2: 24 mmol/L (ref 22–32)
Calcium: 8.1 mg/dL — ABNORMAL LOW (ref 8.9–10.3)
Chloride: 105 mmol/L (ref 98–111)
Creatinine, Ser: 1.66 mg/dL — ABNORMAL HIGH (ref 0.61–1.24)
GFR, Estimated: 45 mL/min — ABNORMAL LOW (ref >60)
Glucose, Bld: 126 mg/dL — ABNORMAL HIGH (ref 70–99)
Potassium: 3.9 mmol/L (ref 3.5–5.1)
Sodium: 136 mmol/L (ref 135–145)
Total Bilirubin: 0.4 mg/dL (ref 0.3–1.2)
Total Protein: 7 g/dL (ref 6.5–8.1)

## 2022-07-22 LAB — FERRITIN: Ferritin: 508 ng/mL — ABNORMAL HIGH (ref 24–336)

## 2022-07-22 LAB — RETICULOCYTES
Immature Retic Fract: 15.7 % (ref 2.3–15.9)
RBC.: 2.76 MIL/uL — ABNORMAL LOW (ref 4.22–5.81)
Retic Count, Absolute: 39.5 10*3/uL (ref 19.0–186.0)
Retic Ct Pct: 1.4 % (ref 0.4–3.1)

## 2022-07-22 LAB — TRIGLYCERIDES: Triglycerides: 86 mg/dL (ref <150)

## 2022-07-22 LAB — IRON AND TIBC
Iron: 28 ug/dL — ABNORMAL LOW (ref 45–182)
Saturation Ratios: 14 % — ABNORMAL LOW (ref 17.9–39.5)
TIBC: 195 ug/dL — ABNORMAL LOW (ref 250–450)
UIBC: 167 ug/dL

## 2022-07-22 LAB — MAGNESIUM: Magnesium: 2.1 mg/dL (ref 1.7–2.4)

## 2022-07-22 LAB — PHOSPHORUS: Phosphorus: 3.4 mg/dL (ref 2.5–4.6)

## 2022-07-22 LAB — PREALBUMIN: Prealbumin: 15 mg/dL — ABNORMAL LOW (ref 18–38)

## 2022-07-22 MED ORDER — PANTOPRAZOLE SODIUM 40 MG PO TBEC
40.0000 mg | DELAYED_RELEASE_TABLET | Freq: Every day | ORAL | Status: DC
  Administered 2022-07-22 – 2022-07-27 (×6): 40 mg via ORAL
  Filled 2022-07-22 (×6): qty 1

## 2022-07-22 MED ORDER — POLYETHYLENE GLYCOL 3350 17 G PO PACK
34.0000 g | PACK | Freq: Every morning | ORAL | Status: DC
  Administered 2022-07-22 – 2022-07-23 (×2): 34 g via ORAL
  Filled 2022-07-22 (×5): qty 2

## 2022-07-22 MED ORDER — SODIUM CHLORIDE 0.9 % IV SOLN
250.0000 mg | Freq: Three times a day (TID) | INTRAVENOUS | Status: DC
  Administered 2022-07-22 – 2022-07-26 (×11): 250 mg via INTRAVENOUS
  Filled 2022-07-22 (×16): qty 5

## 2022-07-22 MED ORDER — SODIUM CHLORIDE 0.9 % IV SOLN
250.0000 mg | Freq: Three times a day (TID) | INTRAVENOUS | Status: DC
  Filled 2022-07-22 (×2): qty 5

## 2022-07-22 MED ORDER — TRAVASOL 10 % IV SOLN
INTRAVENOUS | Status: AC
  Filled 2022-07-22: qty 684

## 2022-07-22 MED ORDER — SODIUM CHLORIDE 0.9 % IV SOLN
125.0000 mg | Freq: Once | INTRAVENOUS | Status: AC
  Administered 2022-07-22: 125 mg via INTRAVENOUS
  Filled 2022-07-22: qty 10

## 2022-07-22 MED ORDER — SENNA 8.6 MG PO TABS
1.0000 | ORAL_TABLET | Freq: Every day | ORAL | Status: DC
  Administered 2022-07-22: 8.6 mg via ORAL
  Filled 2022-07-22: qty 1

## 2022-07-22 MED ORDER — METOCLOPRAMIDE HCL 5 MG/ML IJ SOLN
5.0000 mg | Freq: Four times a day (QID) | INTRAMUSCULAR | Status: DC | PRN
  Administered 2022-07-22 – 2022-07-23 (×2): 10 mg via INTRAVENOUS
  Filled 2022-07-22 (×2): qty 2

## 2022-07-22 MED ORDER — METHOCARBAMOL 500 MG PO TABS
1000.0000 mg | ORAL_TABLET | Freq: Four times a day (QID) | ORAL | Status: DC | PRN
  Administered 2022-07-22 – 2022-07-26 (×5): 1000 mg via ORAL
  Filled 2022-07-22 (×5): qty 2

## 2022-07-22 MED ORDER — METOCLOPRAMIDE HCL 10 MG PO TABS: 10.0000 mg | ORAL_TABLET | Freq: Four times a day (QID) | ORAL | Status: DC | PRN

## 2022-07-22 MED ORDER — CALCIUM POLYCARBOPHIL 625 MG PO TABS: 1250.0000 mg | ORAL_TABLET | Freq: Every day | ORAL | Status: DC

## 2022-07-22 NOTE — Progress Notes (Addendum)
Thomas [name] [id number] 1955/08/02  CARE TEAM:  PCP: [name], NP  Outpatient Care Team: Patient Care Team: [name], NP as PCP - General Benito Mccreedy, MD (Internal Medicine) Patient, No Pcp Per (General Practice) Michael Boston, MD as Consulting Physician (Colon and Rectal Surgery) Irene Shipper, MD as Consulting Physician (Gastroenterology) Ladell Pier, MD as Consulting Physician (Oncology)  Inpatient Treatment Team: Treatment Team: Attending Provider: Michael Boston, MD; Consulting Physician: Alma Friendly, MD; Rounding Team: Fatima Blank, MD; Registered Nurse: Philomena Course, RN; Registered Nurse: Ivan Anchors, RN; Pharmacist: Lenis Noon, [facility]   Problem List:   Principal Problem:   Colon cancer [facility]) Active Problems:   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   History of ischemic left MCA stroke 2015   Type 2 diabetes mellitus with diabetic nephropathy, without long-term current use of insulin ([place])   Hypertension   Protein-calorie malnutrition, moderate ([place])   Epilepsy ([place])   Hyperlipidemia   Delirium   Hypokalemia   Incisional hernia, without obstruction or gangrene   CKD (chronic kidney disease) stage 3b   Hypophosphatemia   Normocytic anemia   Atelectasis   21 Days Post-Op  07/01/2022  POST-OPERATIVE DIAGNOSIS:   COLOSTOMY FOR COLON RESECTION INCARCERATED INCISIONAL HERNIA   PROCEDURE:   TAKEDOWN OF END COLOSTOMY WITH ANASTOMOSIS INTRAOPERATIVE ASSESSMENT OF TISSUE VASCULAR PERFUSION USING ICG (indocyanine green) IMMUNOFLUORESCENCE TRANSVERSUS ABDOMINIS PLANE (TAP) BLOCK - BILATERAL LYSIS OF ADHESIONS x90 MINUTES (50% OF CASE) PRIMARY REPAIR OF INCARCERATED INCISIONAL HERNIA   SURGEON:  Adin Hector, MD  OR FINDINGS:    Patient had very dense intra-abdominal adhesions of small bowel to all quadrants of the abdomen as well as colon.  He had a loop of small bowel  incarcerated in a periumbilical midline incisional hernia 4 x 4 cm.  Reduced and primarily repaired.   No obvious metastatic disease on visceral parietal peritoneum or liver.   It is an isoperistaltic ileocolonic anastomosis (proximal transverse colon to distal transverse colon) that rests in the epigastric region.    Assessment  Ileus  [facility] Stay = 21 days)  Plan:  CT scan shows dilated bowel but no transition point.  No leak or abscess or perforation or ischemia.  Mostly reassuring.  Patient did have emesis with contrast with nausea but then felt better and had 2 bowel movements.  Aggressive bowel regimen.  Will double MiraLAX and add Senokot.  Switch metoclopramide to as needed's that I do not know he has gastric ileus.  Stomach does not appear very massive.  Switch medications around.  Make pain medications more as needed.  Moderate malnutrition.  Prealbumin a little better but still low.  Continue TPN for now.  Readvance diet.  If tolerates, consider weaning TPN.  If oral intake is poor, place back up to full TPN.  He has refused trying supplemental nasogastric tube feeds and with his ileus I think it would backfire make him vomit / aspirate again.  ICU delirium resolved.  Question of some sundowning at night but is alert this morning.  Appreciate medical help.    Follow electrolytes closely. He has had issues with hypokalemia & hypophosphatemia.  Stable for now.  Chronic kidney disease but nonoliguric.  Try and keep on the dry side as possible.  Pulmonary status seems to be stable.   Nebulizers as needed.  Medicine following.  Some acute blood loss anemia in setting of chronic anemia of chronic disease and iron deficiency  anemia.  Hgb stable low.  Iron deficient.  Give another dose of IV iron.  I would not trust oral route as that may make the ileus worse.  -VTE prophylaxis- SCDs, etc  -mobilize as tolerated to help recovery.  Rather deconditioned but was able to  mobilize in a with nursing and therapy.  Starting to walk in the hallways more regularly.  Guardedly hopeful sign.  I agree he would benefit from rehab at a skilled facility with more aggressive therapies.  Plans to go to skilled nursing facility.  Hopefully if he has adequate oral intake and is doing better could leave may be as soon as tomorrow.  We will see.     Disposition:  The patient is from: Home  Anticipate discharge to:  [facility] (SNF)  Anticipated Date of Discharge is:  March 16,2024    Barriers to discharge:  Pending Clinical improvement (more likely than not)  Patient currently is NOT MEDICALLY STABLE for discharge from the hospital from a surgery standpoint.   I updated the patient's status to the patient and nurse at bedside & ICU RNs just outside room.  Recommendations were made.  Questions were answered.  They expressed understanding & appreciation.    I reviewed nursing notes, last 24 h vitals and pain scores, last 48 h intake and output, last 24 h labs and trends, and last 24 h imaging results.  Reviewed notes with respiratory physical therapies as well as critical care nursing.  I have reviewed this patient's available data, including medical history, events of note, test results, etc as part of my evaluation.  A significant portion of that time was spent in counseling.  Care during the described time interval was provided by me.  This care required moderate level of medical decision making.  07/22/2022    Subjective: (Chief complaint)  Patient vomited CT contrast but then felt better with bowel movements.  Denies much pain.  Walking low bit better.  Objective:  Vital signs:  Vitals:   07/21/22 0605 07/21/22 1239 07/21/22 2148 07/22/22 0532  BP: 130/72 (!) 143/71 (!) 140/74 (!) 145/74  Pulse: 93 89 80 88  Resp: '18 17 18 18  '$ Temp: 97.7 F (36.5 C) 98.4 F (36.9 C) 97.7 F (36.5 C) 98.5 F (36.9 C)  TempSrc: Oral Oral Oral Oral  SpO2:  94% 93% 93% 96%  Weight:      Height:        Last BM Date : 07/15/22  Intake/Output   Yesterday:  03/13 0701 - 03/14 0700 In: 1284.6 [P.O.:690; I.V.:594.6] Out: 1700 [Urine:1700] This shift:  No intake/output data recorded.  Bowel function:  Flatus: YES  BM:  YES  Drain: (No drain)   Physical Exam:  General: Pt awake and alert.  no acute distress.  Not toxic or sickly.  Mildly frustrated but consolable.   Eyes: PERRL, normal EOM.  Sclera clear.  No icterus Neuro: CN II-XII intact w/o focal sensory/motor deficits. Lymph: No head/neck/groin lymphadenopathy Psych:  No delerium/psychosis/paranoia.  Oriented x 4.  No confusion or anxiety today. HENT: Normocephalic, Mucus membranes moist.  No thrush Neck: Supple, No tracheal deviation.  No obvious thyromegaly Chest: No pain to chest wall compression.  Good respiratory excursion.  No audible wheezing CV:  Pulses intact.  Regular rhythm.  No major extremity edema MS: Normal AROM mjr joints.  No obvious deformity  Abdomen: Obese but  Soft.  Mildy distended.  Nontender .  Right upper quadrant old colostomy wound  closed and dry  Normal healing ridge.  No purulence or cellulitis.  No guarding nor evidence of peritonitis.  No incarcerated hernias.  Rectal: Flexi-Seal in place with thin gray/brown effluent Ext:   No deformity.  No mjr edema.  No cyanosis Skin: No petechiae / purpurea.  No major sores.  Warm and dry    Results:   Cultures: Recent Results (from the past 720 hour(s))  MRSA Next Gen by PCR, Nasal     Status: None   Collection Time: 07/03/22  3:16 PM   Specimen: Nasal Mucosa; Nasal Swab  Result Value Ref Range Status   MRSA by PCR Next Gen NOT DETECTED NOT DETECTED Final    Comment: (NOTE) The GeneXpert MRSA Assay (FDA approved for NASAL specimens only), is one component of a comprehensive MRSA colonization surveillance program. It is not intended to diagnose MRSA infection nor to guide or monitor treatment for  MRSA infections. Test performance is not FDA approved in patients less than 46 years old. Performed at [facility], [place] [address] [place], [place] [address]   Culture, blood (Routine X 2) w Reflex to ID Panel     Status: None   Collection Time: 07/11/22  9:57 AM   Specimen: BLOOD  Result Value Ref Range Status   Specimen Description   Final    BLOOD BLOOD LEFT ARM Performed at [facility] [address], [place], [place] [address]    Special Requests   Final    BOTTLES DRAWN AEROBIC AND ANAEROBIC Blood Culture adequate volume Performed at [facility] [address] [place], [place] [address]    Culture   Final    NO GROWTH 5 DAYS Performed at [facility], [place] [address] [place], [place] [address]    Report Status 07/16/2022 FINAL  Final  Culture, blood (Routine X 2) w Reflex to ID Panel     Status: None   Collection Time: 07/11/22  9:57 AM   Specimen: BLOOD  Result Value Ref Range Status   Specimen Description   Final    BLOOD BLOOD LEFT HAND Performed at [facility] [address] [place], [place] [address]    Special Requests   Final    BOTTLES DRAWN AEROBIC AND ANAEROBIC Blood Culture adequate volume Performed at [facility] [address], [place], [place] [address]    Culture   Final    NO GROWTH 5 DAYS Performed at [facility], [place] [address] [place], [place] [address]    Report Status 07/16/2022 FINAL  Final  C Difficile Quick Screen (NO PCR Reflex)     Status: None   Collection Time: 07/12/22  8:24 AM   Specimen: STOOL  Result Value Ref Range Status   C Diff antigen NEGATIVE NEGATIVE Final   C Diff toxin NEGATIVE NEGATIVE Final   C Diff interpretation No C. difficile detected.  Final    Comment: Performed at [facility], [place] [address], [place], [place] [address]  Gastrointestinal Panel by PCR , Stool     Status: None    Collection Time: 07/12/22  8:24 AM   Specimen: STOOL  Result Value Ref Range Status   Campylobacter species NOT DETECTED NOT DETECTED Final   Plesimonas shigelloides NOT DETECTED NOT DETECTED Final   Salmonella species NOT DETECTED NOT DETECTED Final   Yersinia enterocolitica NOT DETECTED NOT DETECTED Final   Vibrio species NOT DETECTED NOT  DETECTED Final   Vibrio cholerae NOT DETECTED NOT DETECTED Final   Enteroaggregative E coli (EAEC) NOT DETECTED NOT DETECTED Final   Enteropathogenic E coli (EPEC) NOT DETECTED NOT DETECTED Final   Enterotoxigenic E coli (ETEC) NOT DETECTED NOT DETECTED Final   Shiga like toxin producing E coli (STEC) NOT DETECTED NOT DETECTED Final   Shigella/Enteroinvasive E coli (EIEC) NOT DETECTED NOT DETECTED Final   Cryptosporidium NOT DETECTED NOT DETECTED Final   Cyclospora cayetanensis NOT DETECTED NOT DETECTED Final   Entamoeba histolytica NOT DETECTED NOT DETECTED Final   Giardia lamblia NOT DETECTED NOT DETECTED Final   Adenovirus F40/41 NOT DETECTED NOT DETECTED Final   Astrovirus NOT DETECTED NOT DETECTED Final   Norovirus GI/GII NOT DETECTED NOT DETECTED Final   Rotavirus A NOT DETECTED NOT DETECTED Final   Sapovirus (I, II, IV, and V) NOT DETECTED NOT DETECTED Final    Comment: Performed at [facility], [place] [place], [place] [address]    Labs: Results for orders placed or performed during the hospital encounter of 07/01/22 (from the past 48 hour(s))  Prealbumin     Status: Abnormal   Collection Time: 07/22/22  2:09 AM  Result Value Ref Range   Prealbumin 15 (L) 18 - 38 mg/dL    Comment: Performed at [facility], [address], [place], [place] [address]  Comprehensive metabolic panel     Status: Abnormal   Collection Time: 07/22/22  2:09 AM  Result Value Ref Range   Sodium 136 135 - 145 mmol/L   Potassium 3.9 3.5 - 5.1 mmol/L   Chloride 105 98 - 111 mmol/L   CO2 24 22 - 32 mmol/L   Glucose, Bld 126 (H) 70 - 99  mg/dL    Comment: Glucose reference range applies only to samples taken after fasting for at least 8 hours.   BUN 36 (H) 8 - 23 mg/dL   Creatinine, Ser 1.66 (H) 0.61 - 1.24 mg/dL   Calcium 8.1 (L) 8.9 - 10.3 mg/dL   Total Protein 7.0 6.5 - 8.1 g/dL   Albumin 2.2 (L) 3.5 - 5.0 g/dL   AST 25 15 - 41 U/L   ALT 60 (H) 0 - 44 U/L   Alkaline Phosphatase 91 38 - 126 U/L   Total Bilirubin 0.4 0.3 - 1.2 mg/dL   GFR, Estimated 45 (L) >60 mL/min    Comment: (NOTE) Calculated using the CKD-EPI Creatinine Equation (2021)    Anion gap 7 5 - 15    Comment: Performed at [facility], [place] [address], [place], [place] [address]  Magnesium     Status: None   Collection Time: 07/22/22  2:09 AM  Result Value Ref Range   Magnesium 2.1 1.7 - 2.4 mg/dL    Comment: Performed at [facility], [place] [address], [place], [place] [address]  Phosphorus     Status: None   Collection Time: 07/22/22  2:09 AM  Result Value Ref Range   Phosphorus 3.4 2.5 - 4.6 mg/dL    Comment: Performed at [facility], [place] [address] [place], [place] [address]  Triglycerides     Status: None   Collection Time: 07/22/22  2:09 AM  Result Value Ref Range   Triglycerides 86 <150 mg/dL    Comment: Performed at [facility], [place] [address] [place], [place] [address]  Iron and TIBC     Status: Abnormal   Collection Time: 07/22/22  2:09 AM  Result Value Ref Range   Iron 28 (L) 45 - 182 ug/dL   TIBC 195 (L) 250 - 450 ug/dL   Saturation Ratios 14 (L) 17.9 - 39.5 %   UIBC 167 ug/dL    Comment: Performed at [facility], [place] [address], [place], [place] [address]  Ferritin     Status: Abnormal   Collection Time: 07/22/22  2:09 AM  Result Value Ref Range   Ferritin 508 (H) 24 - 336 ng/mL    Comment: Performed at [facility], [place] [address] [place], [place] [address]  Reticulocytes     Status: Abnormal   Collection  Time: 07/22/22  2:09 AM  Result Value Ref Range   Retic Ct Pct 1.4 0.4 - 3.1 %   RBC. 2.76 (L) 4.22 - 5.81 MIL/uL   Retic Count, Absolute 39.5 19.0 - 186.0 K/uL   Immature Retic Fract 15.7 2.3 - 15.9 %    Comment: Performed at [facility], [place] [address], [place], [place] [address]    Imaging / Studies: CT ABDOMEN PELVIS W CONTRAST  Result Date: 07/21/2022 CLINICAL DATA:  Postoperative abdominal pain, colon cancer status post transverse colon resection * Tracking Code: BO * EXAM: CT ABDOMEN AND PELVIS WITH CONTRAST TECHNIQUE: Multidetector CT imaging of the abdomen and pelvis was performed using the standard protocol following bolus administration of intravenous contrast. RADIATION DOSE REDUCTION: This exam was performed according to the departmental dose-optimization program which includes automated exposure control, adjustment of the mA and/or kV according to patient size and/or use of iterative reconstruction technique. CONTRAST:  39m OMNIPAQUE IOHEXOL 300 MG/ML SOLN, additional oral enteric contrast COMPARISON:  07/09/2022 FINDINGS: Lower chest: Moderate bilateral pleural effusions and associated atelectasis or consolidation. Coronary artery calcifications. Hepatobiliary: Unchanged hypodense lesion of the anterior right lobe of the liver measuring 1.1 x 0.7 cm (series 2, image 17). No gallstones, gallbladder wall thickening, or biliary dilatation. Pancreas: Unremarkable. No pancreatic ductal dilatation or surrounding inflammatory changes. Spleen: Normal in size without significant abnormality. Adrenals/Urinary Tract: Adrenal glands are unremarkable. Simple, benign bilateral renal cortical cysts, for which no further follow-up or characterization is required. Kidneys are otherwise normal, without renal calculi, solid lesion, or hydronephrosis. Small volume air in the urinary bladder presumably secondary to catheterization. Stomach/Bowel: Stomach is within normal limits. Similar  postoperative configuration of the bowel, with mildly distended and fluid-filled reanastomosed transverse colon measuring up to 7.2 cm in caliber (series 2, image 38). Fluid-filled, mildly distended loops of small bowel throughout the central abdomen measuring up to 4.3 cm in caliber (series 2, image 64). Vascular/Lymphatic: Aortic atherosclerosis. No enlarged abdominal or pelvic lymph nodes. Reproductive: Mild prostatomegaly. Other: Small bilateral fat containing inguinal hernias.  No ascites. Musculoskeletal: No acute or significant osseous findings. IMPRESSION: 1. Similar postoperative configuration of the bowel, with mildly distended and fluid-filled reanastomosed transverse colon measuring up to 7.2 cm in caliber. 2. Fluid-filled, mildly distended loops of small bowel throughout the central abdomen measuring up to 4.3 cm in caliber. Findings are most consistent with postoperative ileus, gas and fluid present to the rectum. 3. Unchanged hypodense lesion of the anterior right lobe of the liver measuring 1.1 x 0.7 cm, incompletely characterized. 4. Moderate bilateral pleural effusions and associated atelectasis or consolidation. 5. Coronary artery disease. Aortic Atherosclerosis (ICD10-I70.0). Electronically Signed   By: ADelanna AhmadiM.D.   On: 07/21/2022 18:17    Medications / Allergies: per chart  Antibiotics: Anti-infectives (From admission, onward)  Start     Dose/Rate Route Frequency Ordered Stop   07/22/22 0800  erythromycin 250 mg in sodium chloride 0.9 % 100 mL IVPB  Status:  Discontinued        250 mg 100 mL/hr over 60 Minutes Intravenous Every 8 hours 07/22/22 0713 07/22/22 0728   07/10/22 0400  piperacillin-tazobactam (ZOSYN) IVPB 3.375 g  Status:  Discontinued        3.375 g 12.5 mL/hr over 240 Minutes Intravenous Every 8 hours 07/09/22 2040 07/10/22 0738   07/08/22 1100  piperacillin-tazobactam (ZOSYN) IVPB 3.375 g  Status:  Discontinued        3.375 g 12.5 mL/hr over 240 Minutes  Intravenous Every 8 hours 07/08/22 1001 07/09/22 2040   07/08/22 1000  erythromycin 250 mg in sodium chloride 0.9 % 100 mL IVPB  Status:  Discontinued        250 mg 100 mL/hr over 60 Minutes Intravenous Every 12 hours 07/08/22 0739 07/09/22 0730   07/06/22 0915  erythromycin 250 mg in sodium chloride 0.9 % 100 mL IVPB  Status:  Discontinued        250 mg 100 mL/hr over 60 Minutes Intravenous Every 8 hours 07/06/22 0823 07/08/22 0739   07/03/22 1700  piperacillin-tazobactam (ZOSYN) IVPB 3.375 g        3.375 g 12.5 mL/hr over 240 Minutes Intravenous Every 8 hours 07/03/22 1612 07/08/22 0450   07/02/22 0000  cefoTEtan (CEFOTAN) 2 g in sodium chloride 0.9 % 100 mL IVPB        2 g 200 mL/hr over 30 Minutes Intravenous Every 12 hours 07/01/22 1737 07/02/22 0051   07/01/22 1400  neomycin (MYCIFRADIN) tablet 1,000 mg  Status:  Discontinued       See Hyperspace for full Linked Orders Report.   1,000 mg Oral 3 times per day 07/01/22 1054 07/01/22 1109   07/01/22 1400  metroNIDAZOLE (FLAGYL) tablet 1,000 mg  Status:  Discontinued       See Hyperspace for full Linked Orders Report.   1,000 mg Oral 3 times per day 07/01/22 1054 07/01/22 1109   07/01/22 1100  cefoTEtan (CEFOTAN) 2 g in sodium chloride 0.9 % 100 mL IVPB        2 g 200 mL/hr over 30 Minutes Intravenous On call to O.R. 07/01/22 1054 07/01/22 1204         Note: Portions of this report may have been transcribed using voice recognition software. Every effort was made to ensure accuracy; however, inadvertent computerized transcription errors may be present.   Any transcriptional errors that result from this process are unintentional.    Adin Hector, MD, FACS, MASCRS Esophageal, Gastrointestinal & Colorectal Surgery Robotic and Minimally Invasive Surgery  [facility] [place]. [address], [place], [place] [address] [phone] Fax [phone] Main  CONTACT  INFORMATION:  Weekday (9AM-5PM): Call [facility] main office at [phone]  Weeknight (5PM-9AM) or Weekend/Holiday: Check [email or web address] (password " [email or web address]") for General Surgery [facility] coverage  (Please, do not use SecureChat as it is not reliable communication to reach operating surgeons for immediate patient care given surgeries/outpatient duties/clinic/cross-coverage/off post-call which would lead to a delay in care.  Epic staff messaging available for outptient concerns, but may not be answered for 48 hours or more).     07/22/2022  7:29 AM

## 2022-07-22 NOTE — Care Management Important Message (Signed)
Important Message  Patient Details  Name: Thomas [name] MRN: [id number] Date of Birth: 01-04-1956   Medicare Important Message Given:  Yes     Memory Argue 07/22/2022, 10:37 AM

## 2022-07-22 NOTE — Plan of Care (Signed)
7am-7pm: Patient up to his recliner chair several times and into the hallway this shift with staff. Abdomen remains distended. Episode of vomiting toward end of shift. Patient denies nausea and is observed resting peacefully afterward. Getting out the secretions seemed to make patient feel better. Patient enjoyed visit from his wife and a cousin. Ivan Anchors, RN 07/22/22  Problem: Education: Goal: Understanding of discharge needs will improve Outcome: Progressing   Problem: Activity: Goal: Ability to tolerate increased activity will improve Outcome: Progressing   Problem: Bowel/Gastric: Goal: Gastrointestinal status for postoperative course will improve Outcome: Progressing   Problem: Nutritional: Goal: Will attain and maintain optimal nutritional status will improve Outcome: Progressing   Problem: Clinical Measurements: Goal: Postoperative complications will be avoided or minimized Outcome: Progressing

## 2022-07-22 NOTE — Progress Notes (Signed)
PHARMACY - TOTAL PARENTERAL NUTRITION CONSULT NOTE   Indication: Prolonged ileus  Patient Measurements: Height: '5\' 8"'$  (172.7 cm) Weight: 94.8 kg (208 lb 15.9 oz) IBW/kg (Calculated) : 68.4 TPN AdjBW (KG): 73.1 Body mass index is 31.78 kg/m.  Assessment: 67 yo male with hx colon cancer diagnosed 2022 s/p transverse colectomy admitted for elective robotic ostomy takedown with lysis of adhesions, incarcerated incisional hernia repair on 07/01/22. Post-op course complicated by right lower lobe collapse and hypoxemia. Pharmacy consulted for TPN on 2/29 d/t prolonged ileus.  Glucose / Insulin: Hx DM2 but A1c well controlled at 5.1 as of 2/22 -CBG goal: 100-180. BG at goal with no SSI required for >48 hrs.  -CBG checks/SSI discontinued on 3/10. BG WNL on BMP this mornng Electrolytes: All lytes WNL, including CorrCa 9.5. Na at max concentration in TPN. Renal: CKD3 (baseline SCr ~1.5) - SCr stable and close to baseline; BUN elevated but stable - UOP remains adequate Hepatic: LFTs now WNL. T.bili, Alk Phos, TG all remain WNL -AST/ALT bumped to 6-7 x ULN on 3/8 but trended down to normal Intake / Output: Strict I/O not charted. -UOP: 1700 mL + 3 unmeasured. Stool x2 -MIVF: NS @ [facility] Imaging: - 2/27 AXR: improving postop pneumoperitoneum - 2/29 AXR: possible ileus  - 3/1 CT a/p: difficult to r/o anastomotic leak d/t contrast not reaching anastomosis, but no signs of abscess - 3/13: CT Abdomen Pelvis: Findings most consistent with post-op ileus - dilated bowel. No leak, abscess, perforation, or ischemia. GI Surgeries / Procedures:  - 2/22: robotic colostomy takedown and lysis of adhesions  Central access: implanted port. Double lumen PICC placed 3/2 TPN start date: 2/29  Nutritional Goals: Goal TPN rate is 100 mL/hr (provides 130 g of protein, 360 g dextrose, and 2486 kcals per day)  RD Assessment: pending Estimated Needs Total Energy Estimated Needs: 2400-2600 kcals Total Protein  Estimated Needs: 125-140 grams Total Fluid Estimated Needs: >/= 2.4L  Current Nutrition: Plan to discharge to SNF once tolerating PO intake and meeting >50% of needs -Dysphagia 1 Diet. -TPN at half-rate (instructed to reduce rate on 3/7 by CCS)  Significant Discussion: -3/14: Inquired about potential need to increase TPN back to goal rate. Per CCS note, if pt doesn't tolerate advancing diet/poor PO intake will advance back to goal.  Plan:  At 1800: Continue TPN at 50 mL/hr (half of goal rate) Provides 68 g protein, 1205 kcal Electrolytes in TPN: No changes Na 154 mEq/L (max concentration) K 15 mEq/L Ca 0 mEq/L Mg 4 mEq/L Phos 10 mmol/L Cl:Ac 1:1 Add MVI and trace elements to TPN  No further CBG check/SSI at this time. Will consider resuming if TPN rate needs to be increased  [facility] at [facility]. Further management by MD. Monitor TPN labs on Mon/Thurs. Recheck electrolytes with AM labs. Follow for ability to tolerate advancing diet.   Lenis Noon, PharmD 07/22/22 9:07 AM

## 2022-07-23 ENCOUNTER — Other Ambulatory Visit (HOSPITAL_COMMUNITY): Payer: Self-pay

## 2022-07-23 DIAGNOSIS — C184 Malignant neoplasm of transverse colon: Secondary | ICD-10-CM | POA: Diagnosis not present

## 2022-07-23 LAB — BASIC METABOLIC PANEL
Anion gap: 5 (ref 5–15)
BUN: 36 mg/dL — ABNORMAL HIGH (ref 8–23)
CO2: 23 mmol/L (ref 22–32)
Calcium: 8.1 mg/dL — ABNORMAL LOW (ref 8.9–10.3)
Chloride: 108 mmol/L (ref 98–111)
Creatinine, Ser: 1.57 mg/dL — ABNORMAL HIGH (ref 0.61–1.24)
GFR, Estimated: 49 mL/min — ABNORMAL LOW (ref >60)
Glucose, Bld: 150 mg/dL — ABNORMAL HIGH (ref 70–99)
Potassium: 3.9 mmol/L (ref 3.5–5.1)
Sodium: 136 mmol/L (ref 135–145)

## 2022-07-23 LAB — MAGNESIUM: Magnesium: 2.1 mg/dL (ref 1.7–2.4)

## 2022-07-23 LAB — PHOSPHORUS: Phosphorus: 3.4 mg/dL (ref 2.5–4.6)

## 2022-07-23 MED ORDER — METOCLOPRAMIDE HCL 10 MG PO TABS
10.0000 mg | ORAL_TABLET | Freq: Three times a day (TID) | ORAL | Status: DC
  Administered 2022-07-23 – 2022-07-27 (×18): 10 mg via ORAL
  Filled 2022-07-23 (×17): qty 1

## 2022-07-23 MED ORDER — LEVETIRACETAM IN NACL 500 MG/100ML IV SOLN
500.0000 mg | Freq: Two times a day (BID) | INTRAVENOUS | Status: DC
  Administered 2022-07-23 – 2022-07-26 (×6): 500 mg via INTRAVENOUS
  Filled 2022-07-23 (×6): qty 100

## 2022-07-23 MED ORDER — HYDROCODONE-ACETAMINOPHEN 5-325 MG PO TABS
1.0000 | ORAL_TABLET | ORAL | Status: DC | PRN
  Administered 2022-07-23 (×2): 2 via ORAL
  Administered 2022-07-24: 1 via ORAL
  Administered 2022-07-24: 2 via ORAL
  Filled 2022-07-23 (×5): qty 2
  Filled 2022-07-23: qty 1

## 2022-07-23 MED ORDER — POTASSIUM CHLORIDE 10 MEQ/100ML IV SOLN
10.0000 meq | INTRAVENOUS | Status: AC
  Administered 2022-07-23 (×4): 10 meq via INTRAVENOUS
  Filled 2022-07-23 (×4): qty 100

## 2022-07-23 MED ORDER — INSULIN ASPART 100 UNIT/ML IJ SOLN
0.0000 [IU] | Freq: Four times a day (QID) | INTRAMUSCULAR | Status: DC
  Administered 2022-07-24: 2 [IU] via SUBCUTANEOUS
  Administered 2022-07-24 (×3): 3 [IU] via SUBCUTANEOUS
  Administered 2022-07-24: 2 [IU] via SUBCUTANEOUS
  Administered 2022-07-25 (×2): 3 [IU] via SUBCUTANEOUS
  Administered 2022-07-25 – 2022-07-26 (×3): 2 [IU] via SUBCUTANEOUS

## 2022-07-23 MED ORDER — ACETAMINOPHEN 650 MG RE SUPP: 650.0000 mg | Freq: Four times a day (QID) | RECTAL | Status: DC | PRN

## 2022-07-23 MED ORDER — SIMETHICONE 80 MG PO CHEW
80.0000 mg | CHEWABLE_TABLET | Freq: Two times a day (BID) | ORAL | Status: DC
  Administered 2022-07-23 – 2022-07-26 (×7): 80 mg via ORAL
  Filled 2022-07-23 (×7): qty 1

## 2022-07-23 MED ORDER — ACETAMINOPHEN 325 MG PO TABS
325.0000 mg | ORAL_TABLET | Freq: Four times a day (QID) | ORAL | Status: DC | PRN
  Administered 2022-07-23 – 2022-07-25 (×3): 650 mg via ORAL
  Administered 2022-07-25: 325 mg via ORAL
  Filled 2022-07-23 (×4): qty 2

## 2022-07-23 MED ORDER — SENNA 8.6 MG PO TABS
2.0000 | ORAL_TABLET | Freq: Every day | ORAL | Status: DC
  Administered 2022-07-23 – 2022-07-26 (×3): 17.2 mg via ORAL
  Filled 2022-07-23 (×4): qty 2

## 2022-07-23 MED ORDER — TRAVASOL 10 % IV SOLN
INTRAVENOUS | Status: AC
  Filled 2022-07-23: qty 1344

## 2022-07-23 NOTE — Progress Notes (Signed)
PHARMACY - TOTAL PARENTERAL NUTRITION CONSULT NOTE   Indication: Prolonged ileus  Patient Measurements: Height: 5\' 8"  (172.7 cm) Weight: 92.3 kg (203 lb 7.8 oz) IBW/kg (Calculated) : 68.4 TPN AdjBW (KG): 73.1 Body mass index is 30.94 kg/m.  Assessment: 67 yo male with hx colon cancer diagnosed 2022 s/p transverse colectomy admitted for elective robotic ostomy takedown with lysis of adhesions, incarcerated incisional hernia repair on 07/01/22. Post-op course complicated by right lower lobe collapse and hypoxemia. Pharmacy consulted for TPN on 2/29 d/t prolonged ileus.  Glucose / Insulin: Hx DM2 but A1c well controlled at 5.1 as of 2/22. D/c'd SSI and insulin checks on 3/10 as controlled. AM glucose up to 150 but remains ok. Electrolytes: All lytes WNL, including CorrCa. Na at max concentration in TPN. Renal: CKD3 (baseline SCr ~1.5) - SCr stable and close to baseline; BUN elevated but stable - UOP remains adequate Hepatic: LFTs now WNL. T.bili, Alk Phos, TG all remain WNL -AST/ALT bumped to 6-7 x ULN on 3/8 but trended down to normal Intake / Output: Strict I/O not charted. Erythromycin IV for gut motility and aggressive bowel regimen. Concern around trialing NG tube feeds again for risk of emesis / aspiration. -UOP: 1300 mL + 2 unmeasured. Did have an episode of emesis yesterday -MIVF: NS @ [facility] Imaging: - 2/27 AXR: improving postop pneumoperitoneum - 2/29 AXR: possible ileus  - 3/1 CT a/p: difficult to r/o anastomotic leak d/t contrast not reaching anastomosis, but no signs of abscess - 3/13: CT Abdomen Pelvis: Findings most consistent with post-op ileus - dilated bowel. No leak, abscess, perforation, or ischemia. GI Surgeries / Procedures:  - 2/22: robotic colostomy takedown and lysis of adhesions  Central access: implanted port. Double lumen PICC placed 3/2 TPN start date: 2/29  Nutritional Goals: Goal TPN rate is 100 mL/hr (provides 134 g of protein, 336 g dextrose, and 2,400  kcals per day)  RD Assessment: pending Estimated Needs Total Energy Estimated Needs: 2400-2600 kcals Total Protein Estimated Needs: 125-140 grams Total Fluid Estimated Needs: >/= 2.4L  Current Nutrition: Plan to discharge to SNF once tolerating PO intake and meeting >50% of needs -Dysphagia 1 Diet. -TPN at half-rate (instructed to reduce rate on 3/7 by CCS)  Significant Discussion: -3/14: Inquired about potential need to increase TPN back to goal rate. Per CCS note, if pt doesn't tolerate advancing diet/poor PO intake will advance back to goal.Plan to advance back to goal on 3/15.  Plan:  At 1800: Increase TPN to goal at 100 mL/hr Provides 134 g protein, 2,400 kcal Electrolytes in TPN: No changes Na 154 mEq/L (max concentration) K 15 mEq/L Ca 0 mEq/L Mg 4 mEq/L Phos 10 mmol/L Cl:Ac 1:1 Add MVI and trace elements to TPN  With TPN increase in dextrose, will add back moderate SSI and CBG checks Q6h and continue to monitor mIVF at [facility]. Further management by MD Monitor TPN labs on Mon/Thurs Follow for ability to tolerate advancing diet    Elenor Quinones, PharmD, BCPS, BCIDP Clinical Pharmacist 07/23/2022 7:55 AM

## 2022-07-23 NOTE — Progress Notes (Signed)
Physical Therapy Treatment Patient Details Name: DEMARQUIS BRAGANZA MRN: [id number] DOB: 09/30/1955 Today's Date: 07/23/2022   History of Present Illness Patient is a 67 yo male admitted 07/01/22  S/P LYSIS OF ADHESIONS , PRIMARY REPAIR OF INCARCERATED INCISIONAL HERNIA , TAKEDOWN OF END COLOSTOMY WITH ANASTOMOSIS.2/24 moved to stepdown unit for hypoxemia, right lower lobe collapse CT chest showed debris in right lower lobe with atelectasis. Now with post op ileus, asp pneumonitis. PMH, colon and rectal  cancer, colostomy, VDRF with trach 2022,   DVT, afib, DM, HTN, seizure, CVA, fromtal meningioma, lobectomy    PT Comments    POD # 17 now ILEUS Pt was OOB in recliner with Spouse at bedside.  Assisted with amb in hallway.  General Gait Details: initially unsteady noted stability improved with distance. Cues for trunk extension as able.  Three standing rest breaks.  Returned to room then Educated pt on "rocking" trunk to facilitate GI movement.  (Ileus)   Recommendations for follow up therapy are one component of a multi-disciplinary discharge planning process, led by the attending physician.  Recommendations may be updated based on patient status, additional functional criteria and insurance authorization.  Follow Up Recommendations  Skilled nursing-short term rehab (<3 hours/day) Can patient physically be transported by private vehicle: Yes   Assistance Recommended at Discharge Intermittent Supervision/Assistance  Patient can return home with the following Assist for transportation;Assistance with cooking/housework;Help with stairs or ramp for entrance;A little help with walking and/or transfers   Equipment Recommendations  None recommended by PT    Recommendations for Other Services       Precautions / Restrictions Precautions Precautions: Fall Precaution Comments: abdominal, ILEUS Restrictions Weight Bearing Restrictions: No     Mobility  Bed Mobility               General  bed mobility comments: OOB in recliner    Transfers Overall transfer level: Needs assistance Equipment used: Rolling walker (2 wheels) Transfers: Sit to/from Stand Sit to Stand: Min guard           General transfer comment: increased time, cues to power up with LEs.    Ambulation/Gait Ambulation/Gait assistance: Min guard Gait Distance (Feet): 125 Feet Assistive device: Rolling walker (2 wheels) Gait Pattern/deviations: Step-through pattern, Decreased stride length Gait velocity: decr     General Gait Details: initially unsteady noted stability improved with distance. cues for trunk extension as able   Stairs             Wheelchair Mobility    Modified Rankin (Stroke Patients Only)       Balance                                            Cognition Arousal/Alertness: Awake/alert Behavior During Therapy: WFL for tasks assessed/performed Overall Cognitive Status: Within Functional Limits for tasks assessed                                 General Comments: AxO x 3 pleasant.  Max encouragement at times.        Exercises      General Comments        Pertinent Vitals/Pain Pain Assessment Faces Pain Scale: Hurts a little bit Pain Location: ABD Pain Descriptors / Indicators: Discomfort, Grimacing, Tightness Pain Intervention(s): Monitored during session  Home Living                          Prior Function            PT Goals (current goals can now be found in the care plan section) Progress towards PT goals: Progressing toward goals    Frequency    Min 3X/week      PT Plan Current plan remains appropriate    Co-evaluation              AM-PAC PT "6 Clicks" Mobility   Outcome Measure  Help needed turning from your back to your side while in a flat bed without using bedrails?: A Little Help needed moving from lying on your back to sitting on the side of a flat bed without using  bedrails?: A Little Help needed moving to and from a bed to a chair (including a wheelchair)?: A Little Help needed standing up from a chair using your arms (e.g., wheelchair or bedside chair)?: A Little Help needed to walk in hospital room?: A Little Help needed climbing 3-5 steps with a railing? : A Lot 6 Click Score: 17    End of Session Equipment Utilized During Treatment: Gait belt Activity Tolerance: Patient tolerated treatment well Patient left: in chair;with family/visitor present;with call bell/phone within reach Nurse Communication: Mobility status PT Visit Diagnosis: Difficulty in walking, not elsewhere classified (R26.2);Pain     Time: [id number] PT Time Calculation (min) (ACUTE ONLY): 30 min  Charges:  $Gait Training: 23-37 mins                     Rica Koyanagi  PTA [facility] M-F          [phone] Weekend pager [phone]

## 2022-07-23 NOTE — Progress Notes (Addendum)
CHALEN KUCHENBECKER [id number] 08/26/1957  CARE TEAM:  PCP: [name], NP  Outpatient Care Team: Patient Care Team: [name], NP as PCP - General Benito Mccreedy, MD (Internal Medicine) Patient, No Pcp Per (General Practice) Michael Boston, MD as Consulting Physician (Colon and Rectal Surgery) Irene Shipper, MD as Consulting Physician (Gastroenterology) Ladell Pier, MD as Consulting Physician (Oncology)  Inpatient Treatment Team: Treatment Team: Attending Provider: Michael Boston, MD; Consulting Physician: Alma Friendly, MD; Rounding Team: Fatima Blank, MD; Registered Nurse: Philomena Course, RN   Problem List:   Principal Problem:   Colon cancer [facility]) Active Problems:   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   History of ischemic left MCA stroke 2015   Type 2 diabetes mellitus with diabetic nephropathy, without long-term current use of insulin ([place])   Hypertension   Protein-calorie malnutrition, moderate ([place])   Epilepsy ([place])   Hyperlipidemia   Delirium   Hypokalemia   Incisional hernia, without obstruction or gangrene   CKD (chronic kidney disease) stage 3b   Hypophosphatemia   Normocytic anemia   Atelectasis   Hyperglycemia   22 Days Post-Op  07/01/2022  POST-OPERATIVE DIAGNOSIS:   COLOSTOMY FOR COLON RESECTION INCARCERATED INCISIONAL HERNIA   PROCEDURE:   TAKEDOWN OF END COLOSTOMY WITH ANASTOMOSIS INTRAOPERATIVE ASSESSMENT OF TISSUE VASCULAR PERFUSION USING ICG (indocyanine green) IMMUNOFLUORESCENCE TRANSVERSUS ABDOMINIS PLANE (TAP) BLOCK - BILATERAL LYSIS OF ADHESIONS x90 MINUTES (50% OF CASE) PRIMARY REPAIR OF INCARCERATED INCISIONAL HERNIA   SURGEON:  Adin Hector, MD  OR FINDINGS:    Patient had very dense intra-abdominal adhesions of small bowel to all quadrants of the abdomen as well as colon.  He had a loop of small bowel incarcerated in a periumbilical midline incisional hernia 4 x 4 cm.   Reduced and primarily repaired.   No obvious metastatic disease on visceral parietal peritoneum or liver.   It is an isoperistaltic ileocolonic anastomosis (proximal transverse colon to distal transverse colon) that rests in the epigastric region.    Assessment  Ileus  [facility] Stay = 22 days)  Plan:  CT scan shows dilated bowel but no transition point.  No leak or abscess or perforation or ischemia.  Mostly reassuring.  Patient with recurrent nightly retching.  Wondered if it was related to taking a peppermint with apple juice.  Does not think it is related to narcotics.  Again not much appetite but able to keep some liquids down.  Does not want NG tube.  Aggressive bowel regimen.  Continue twice daily MiraLAX and increase Senokot.  Go back on scheduled metoclopramide since he thought that helped.  Erythromycin.  Simethicone. Switch pain medicines around.  Different narcotic.  Discussed with Dr. Benson Norway with gastrology who agrees with these interventions.  Also encourages more turning rotation and even prone positioning to help with correcting ileus and distention.  Try to encourage patient to walk more as well.   Moderate malnutrition.  Prealbumin a little better but still low.  Continue TPN for now.  Since his oral intake is poor.  Increase to full nutrition.  Readvance diet.  If tolerates, consider weaning TPN.  If oral intake is poor, place back up to full TPN.  He has refused trying supplemental nasogastric tube feeds and with his ileus I think it would backfire make him vomit / aspirate again.  ICU delirium resolved.  Question of some sundowning at night but is alert this morning.  Appreciate medical help.    Follow  electrolytes closely. He has had issues with hypokalemia & hypophosphatemia.  Stable for now.  Chronic kidney disease but nonoliguric.  Try and keep on the dry side as possible.  Pulmonary status seems to be stable.   Nebulizers as needed.  Medicine  following.  Some acute blood loss anemia in setting of chronic anemia of chronic disease and iron deficiency anemia.  Hgb stable low.  Iron deficient.  Give another dose of IV iron.  I would not trust oral route as that may make the ileus worse.  -VTE prophylaxis- SCDs, etc  -mobilize as tolerated to help recovery.  Rather deconditioned but was able to mobilize in a with nursing and therapy.  Starting to walk in the hallways more regularly.  Guardedly hopeful sign.  I agree he would benefit from rehab at a skilled facility with more aggressive therapies.  Plans to go to skilled nursing facility.  Hopefully if he has adequate oral intake and is doing better could leave may be as soon as tomorrow.  We will see.     Disposition:  The patient is from: Home  Anticipate discharge to:  [place] (SNF)  Anticipated Date of Discharge is:  July 26, 2022    Barriers to discharge:  Pending Clinical improvement (more likely than not)  Patient currently is NOT MEDICALLY STABLE for discharge from the hospital from a surgery standpoint.   I updated the patient's status to the patient and nurse at bedside & ICU RNs just outside room.  Recommendations were made.  Questions were answered.  They expressed understanding & appreciation.    I reviewed nursing notes, last 24 h vitals and pain scores, last 48 h intake and output, last 24 h labs and trends, and last 24 h imaging results.  Reviewed notes with respiratory physical therapies as well as critical care nursing.  I have reviewed this patient's available data, including medical history, events of note, test results, etc as part of my evaluation.  A significant portion of that time was spent in counseling.  Care during the described time interval was provided by me.  This care required moderate level of medical decision making.  07/23/2022    Subjective: (Chief complaint)  Patient retrogram last night.  Try liquids but not much of an  appetite.  Trying to walk more.  Denies any soreness.  Objective:  Vital signs:  Vitals:   07/21/22 2148 07/22/22 0532 07/22/22 1236 07/22/22 2028  BP: (!) 140/74 (!) 145/74 133/71 138/70  Pulse: 80 88 87 85  Resp: 18 18 18 17   Temp: 97.7 F (36.5 C) 98.5 F (36.9 C) 98.3 F (36.8 C) 98.5 F (36.9 C)  TempSrc: Oral Oral Oral Oral  SpO2: 93% 96% 96% 96%  Weight:      Height:        Last BM Date : 07/21/22  Intake/Output   Yesterday:  03/14 0701 - 03/15 0700 In: 2061.1 [P.O.:660; I.V.:1203.8; IV Piggyback:197.3] Out: 1300 [Urine:1300] This shift:  Total I/O In: 1032 [P.O.:240; I.V.:594.7; IV Piggyback:197.3] Out: 700 [Urine:700]  Bowel function:  Flatus: YES  BM:  YES  Drain: (No drain)   Physical Exam:  General: Pt awake and alert.  no acute distress.  Not toxic or sickly.    Eyes: PERRL, normal EOM.  Sclera clear.  No icterus Neuro: CN II-XII intact w/o focal sensory/motor deficits. Lymph: No head/neck/groin lymphadenopathy Psych:  No delerium/psychosis/paranoia.  Oriented x 4.  No confusion or anxiety today. HENT: Normocephalic, Mucus  membranes moist.  No thrush Neck: Supple, No tracheal deviation.  No obvious thyromegaly Chest: No pain to chest wall compression.  Good respiratory excursion.  No audible wheezing CV:  Pulses intact.  Regular rhythm.  No major extremity edema MS: Normal AROM mjr joints.  No obvious deformity  Abdomen: Obese but  Soft.  Moderately distended.  Nontender .  Right upper quadrant old colostomy wound closed and dry  Normal healing ridge.  No purulence or cellulitis.  No guarding nor evidence of peritonitis.  No incarcerated hernias.  Rectal: Flexi-Seal in place with thin gray/brown effluent Ext:   No deformity.  No mjr edema.  No cyanosis Skin: No petechiae / purpurea.  No major sores.  Warm and dry    Results:   Cultures: Recent Results (from the past 720 hour(s))  MRSA Next Gen by PCR, Nasal     Status: None    Collection Time: 07/03/22  3:16 PM   Specimen: Nasal Mucosa; Nasal Swab  Result Value Ref Range Status   MRSA by PCR Next Gen NOT DETECTED NOT DETECTED Final    Comment: (NOTE) The GeneXpert MRSA Assay (FDA approved for NASAL specimens only), is one component of a comprehensive MRSA colonization surveillance program. It is not intended to diagnose MRSA infection nor to guide or monitor treatment for MRSA infections. Test performance is not FDA approved in patients less than 50 years old. Performed at [facility], [place] [address] [place], [place] [address]   Culture, blood (Routine X 2) w Reflex to ID Panel     Status: None   Collection Time: 07/11/22  9:57 AM   Specimen: BLOOD  Result Value Ref Range Status   Specimen Description   Final    BLOOD BLOOD LEFT ARM Performed at [place] [address] [place], [place] [address]    Special Requests   Final    BOTTLES DRAWN AEROBIC AND ANAEROBIC Blood Culture adequate volume Performed at [place] [facility] [address], [place], [place] [address]    Culture   Final    NO GROWTH 5 DAYS Performed at [facility], [place] [address] [place], [place] [address]    Report Status 07/16/2022 FINAL  Final  Culture, blood (Routine X 2) w Reflex to ID Panel     Status: None   Collection Time: 07/11/22  9:57 AM   Specimen: BLOOD  Result Value Ref Range Status   Specimen Description   Final    BLOOD BLOOD LEFT HAND Performed at [place] [address] [place], [place] [address]    Special Requests   Final    BOTTLES DRAWN AEROBIC AND ANAEROBIC Blood Culture adequate volume Performed at [facility] [address] [place], [place] [address]    Culture   Final    NO GROWTH 5 DAYS Performed at [facility], [place] [address] [place], [place] [address]    Report Status 07/16/2022 FINAL  Final  C Difficile Quick Screen (NO PCR  Reflex)     Status: None   Collection Time: 07/12/22  8:24 AM   Specimen: STOOL  Result Value Ref Range Status   C Diff antigen NEGATIVE NEGATIVE Final   C Diff toxin NEGATIVE NEGATIVE Final   C Diff interpretation No C. difficile detected.  Final    Comment: Performed at [facility], [place] [address], [place], [place] [address]  Gastrointestinal Panel by PCR ,  Stool     Status: None   Collection Time: 07/12/22  8:24 AM   Specimen: STOOL  Result Value Ref Range Status   Campylobacter species NOT DETECTED NOT DETECTED Final   Plesimonas shigelloides NOT DETECTED NOT DETECTED Final   Salmonella species NOT DETECTED NOT DETECTED Final   Yersinia enterocolitica NOT DETECTED NOT DETECTED Final   Vibrio species NOT DETECTED NOT DETECTED Final   Vibrio cholerae NOT DETECTED NOT DETECTED Final   Enteroaggregative E coli (EAEC) NOT DETECTED NOT DETECTED Final   Enteropathogenic E coli (EPEC) NOT DETECTED NOT DETECTED Final   Enterotoxigenic E coli (ETEC) NOT DETECTED NOT DETECTED Final   Shiga like toxin producing E coli (STEC) NOT DETECTED NOT DETECTED Final   Shigella/Enteroinvasive E coli (EIEC) NOT DETECTED NOT DETECTED Final   Cryptosporidium NOT DETECTED NOT DETECTED Final   Cyclospora cayetanensis NOT DETECTED NOT DETECTED Final   Entamoeba histolytica NOT DETECTED NOT DETECTED Final   Giardia lamblia NOT DETECTED NOT DETECTED Final   Adenovirus F40/41 NOT DETECTED NOT DETECTED Final   Astrovirus NOT DETECTED NOT DETECTED Final   Norovirus GI/GII NOT DETECTED NOT DETECTED Final   Rotavirus A NOT DETECTED NOT DETECTED Final   Sapovirus (I, II, IV, and V) NOT DETECTED NOT DETECTED Final    Comment: Performed at [facility], [place] [place], [place] [address]    Labs: Results for orders placed or performed during the hospital encounter of 07/01/22 (from the past 48 hour(s))  Prealbumin     Status: Abnormal   Collection Time: 07/22/22  2:09 AM   Result Value Ref Range   Prealbumin 15 (L) 18 - 38 mg/dL    Comment: Performed at [facility], [address] [place], [place] [address]  Comprehensive metabolic panel     Status: Abnormal   Collection Time: 07/22/22  2:09 AM  Result Value Ref Range   Sodium 136 135 - 145 mmol/L   Potassium 3.9 3.5 - 5.1 mmol/L   Chloride 105 98 - 111 mmol/L   CO2 24 22 - 32 mmol/L   Glucose, Bld 126 (H) 70 - 99 mg/dL    Comment: Glucose reference range applies only to samples taken after fasting for at least 8 hours.   BUN 36 (H) 8 - 23 mg/dL   Creatinine, Ser 1.66 (H) 0.61 - 1.24 mg/dL   Calcium 8.1 (L) 8.9 - 10.3 mg/dL   Total Protein 7.0 6.5 - 8.1 g/dL   Albumin 2.2 (L) 3.5 - 5.0 g/dL   AST 25 15 - 41 U/L   ALT 60 (H) 0 - 44 U/L   Alkaline Phosphatase 91 38 - 126 U/L   Total Bilirubin 0.4 0.3 - 1.2 mg/dL   GFR, Estimated 45 (L) >60 mL/min    Comment: (NOTE) Calculated using the CKD-EPI Creatinine Equation (2021)    Anion gap 7 5 - 15    Comment: Performed at [facility], [place] [address], [place], [place] [address]  Magnesium     Status: None   Collection Time: 07/22/22  2:09 AM  Result Value Ref Range   Magnesium 2.1 1.7 - 2.4 mg/dL    Comment: Performed at [facility], [place] [address] [place] [address] [place], [place] [address]  Phosphorus     Status: None   Collection Time: 07/22/22  2:09 AM  Result Value Ref Range   Phosphorus 3.4 2.5 - 4.6 mg/dL    Comment: Performed at [facility], [place] [address], [place], [place] [address]  Triglycerides     Status: None   Collection Time: 07/22/22  2:09 AM  Result Value Ref Range   Triglycerides 86 <150 mg/dL    Comment: Performed at [facility], [place] [address] [place], [place] [address]  Iron and TIBC     Status: Abnormal   Collection Time: 07/22/22  2:09 AM  Result Value Ref Range   Iron 28 (L) 45 - 182 ug/dL   TIBC 195 (L) 250 - 450 ug/dL   Saturation Ratios 14 (L)  17.9 - 39.5 %   UIBC 167 ug/dL    Comment: Performed at [facility], [place] [address] [place], [place] [address]  Ferritin     Status: Abnormal   Collection Time: 07/22/22  2:09 AM  Result Value Ref Range   Ferritin 508 (H) 24 - 336 ng/mL    Comment: Performed at [facility], [place] [address], [place], [place] [address]  Reticulocytes     Status: Abnormal   Collection Time: 07/22/22  2:09 AM  Result Value Ref Range   Retic Ct Pct 1.4 0.4 - 3.1 %   RBC. 2.76 (L) 4.22 - 5.81 MIL/uL   Retic Count, Absolute 39.5 19.0 - 186.0 K/uL   Immature Retic Fract 15.7 2.3 - 15.9 %    Comment: Performed at [facility], [name] [address], [place], [place] [id number]  Basic metabolic panel     Status: Abnormal   Collection Time: 07/23/22  1:20 AM  Result Value Ref Range   Sodium 136 135 - 145 mmol/L   Potassium 3.9 3.5 - 5.1 mmol/L   Chloride 108 98 - 111 mmol/L   CO2 23 22 - 32 mmol/L   Glucose, Bld 150 (H) 70 - 99 mg/dL    Comment: Glucose reference range applies only to samples taken after fasting for at least 8 hours.   BUN 36 (H) 8 - 23 mg/dL   Creatinine, Ser 1.57 (H) 0.61 - 1.24 mg/dL   Calcium 8.1 (L) 8.9 - 10.3 mg/dL   GFR, Estimated 49 (L) >60 mL/min    Comment: (NOTE) Calculated using the CKD-EPI Creatinine Equation (2021)    Anion gap 5 5 - 15    Comment: Performed at [facility], [place] [address] [place], [place] [address]  Magnesium     Status: None   Collection Time: 07/23/22  1:20 AM  Result Value Ref Range   Magnesium 2.1 1.7 - 2.4 mg/dL    Comment: Performed at [facility], [place] [address], [place], [place] [address]  Phosphorus     Status: None   Collection Time: 07/23/22  1:20 AM  Result Value Ref Range   Phosphorus 3.4 2.5 - 4.6 mg/dL    Comment: Performed at [facility], [place] [address], [place], [place] [address]    Imaging / Studies: CT ABDOMEN PELVIS W  CONTRAST  Result Date: 07/21/2022 CLINICAL DATA:  Postoperative abdominal pain, colon cancer status post transverse colon resection * Tracking Code: BO * EXAM: CT ABDOMEN AND PELVIS WITH CONTRAST TECHNIQUE: Multidetector CT imaging of the abdomen and pelvis was performed using the standard protocol following bolus administration of intravenous contrast. RADIATION DOSE REDUCTION: This exam was performed according to the departmental dose-optimization program which includes automated exposure control, adjustment of the mA and/or kV according to patient size and/or use of iterative reconstruction technique. CONTRAST:  24mL OMNIPAQUE  IOHEXOL 300 MG/ML SOLN, additional oral enteric contrast COMPARISON:  07/09/2022 FINDINGS: Lower chest: Moderate bilateral pleural effusions and associated atelectasis or consolidation. Coronary artery calcifications. Hepatobiliary: Unchanged hypodense lesion of the anterior right lobe of the liver measuring 1.1 x 0.7 cm (series 2, image 17). No gallstones, gallbladder wall thickening, or biliary dilatation. Pancreas: Unremarkable. No pancreatic ductal dilatation or surrounding inflammatory changes. Spleen: Normal in size without significant abnormality. Adrenals/Urinary Tract: Adrenal glands are unremarkable. Simple, benign bilateral renal cortical cysts, for which no further follow-up or characterization is required. Kidneys are otherwise normal, without renal calculi, solid lesion, or hydronephrosis. Small volume air in the urinary bladder presumably secondary to catheterization. Stomach/Bowel: Stomach is within normal limits. Similar postoperative configuration of the bowel, with mildly distended and fluid-filled reanastomosed transverse colon measuring up to 7.2 cm in caliber (series 2, image 38). Fluid-filled, mildly distended loops of small bowel throughout the central abdomen measuring up to 4.3 cm in caliber (series 2, image 64). Vascular/Lymphatic: Aortic atherosclerosis. No  enlarged abdominal or pelvic lymph nodes. Reproductive: Mild prostatomegaly. Other: Small bilateral fat containing inguinal hernias.  No ascites. Musculoskeletal: No acute or significant osseous findings. IMPRESSION: 1. Similar postoperative configuration of the bowel, with mildly distended and fluid-filled reanastomosed transverse colon measuring up to 7.2 cm in caliber. 2. Fluid-filled, mildly distended loops of small bowel throughout the central abdomen measuring up to 4.3 cm in caliber. Findings are most consistent with postoperative ileus, gas and fluid present to the rectum. 3. Unchanged hypodense lesion of the anterior right lobe of the liver measuring 1.1 x 0.7 cm, incompletely characterized. 4. Moderate bilateral pleural effusions and associated atelectasis or consolidation. 5. Coronary artery disease. Aortic Atherosclerosis (ICD10-I70.0). Electronically Signed   By: Delanna Ahmadi M.D.   On: 07/21/2022 18:17    Medications / Allergies: per chart  Antibiotics: Anti-infectives (From admission, onward)    Start     Dose/Rate Route Frequency Ordered Stop   07/22/22 2000  erythromycin 250 mg in sodium chloride 0.9 % 100 mL IVPB        250 mg 100 mL/hr over 60 Minutes Intravenous Every 8 hours 07/22/22 1903     07/22/22 0800  erythromycin 250 mg in sodium chloride 0.9 % 100 mL IVPB  Status:  Discontinued        250 mg 100 mL/hr over 60 Minutes Intravenous Every 8 hours 07/22/22 0713 07/22/22 0728   07/10/22 0400  piperacillin-tazobactam (ZOSYN) IVPB 3.375 g  Status:  Discontinued        3.375 g 12.5 mL/hr over 240 Minutes Intravenous Every 8 hours 07/09/22 2040 07/10/22 0738   07/08/22 1100  piperacillin-tazobactam (ZOSYN) IVPB 3.375 g  Status:  Discontinued        3.375 g 12.5 mL/hr over 240 Minutes Intravenous Every 8 hours 07/08/22 1001 07/09/22 2040   07/08/22 1000  erythromycin 250 mg in sodium chloride 0.9 % 100 mL IVPB  Status:  Discontinued        250 mg 100 mL/hr over 60 Minutes  Intravenous Every 12 hours 07/08/22 0739 07/09/22 0730   07/06/22 0915  erythromycin 250 mg in sodium chloride 0.9 % 100 mL IVPB  Status:  Discontinued        250 mg 100 mL/hr over 60 Minutes Intravenous Every 8 hours 07/06/22 0823 07/08/22 0739   07/03/22 1700  piperacillin-tazobactam (ZOSYN) IVPB 3.375 g        3.375 g 12.5 mL/hr over 240 Minutes Intravenous Every 8 hours 07/03/22 1612 07/08/22  [id number]   07/02/22 0000  cefoTEtan (CEFOTAN) 2 g in sodium chloride 0.9 % 100 mL IVPB        2 g 200 mL/hr over 30 Minutes Intravenous Every 12 hours 07/01/22 1737 07/02/22 0051   07/01/22 1400  neomycin (MYCIFRADIN) tablet 1,000 mg  Status:  Discontinued       See Hyperspace for full Linked Orders Report.   1,000 mg Oral 3 times per day 07/01/22 1054 07/01/22 1109   07/01/22 1400  metroNIDAZOLE (FLAGYL) tablet 1,000 mg  Status:  Discontinued       See Hyperspace for full Linked Orders Report.   1,000 mg Oral 3 times per day 07/01/22 1054 07/01/22 1109   07/01/22 1100  cefoTEtan (CEFOTAN) 2 g in sodium chloride 0.9 % 100 mL IVPB        2 g 200 mL/hr over 30 Minutes Intravenous On call to O.R. 07/01/22 1054 07/01/22 1204         Note: Portions of this report may have been transcribed using voice recognition software. Every effort was made to ensure accuracy; however, inadvertent computerized transcription errors may be present.   Any transcriptional errors that result from this process are unintentional.    Adin Hector, MD, FACS, MASCRS Esophageal, Gastrointestinal & Colorectal Surgery Robotic and Minimally Invasive Surgery  [facility]. [address] [place], [place] [address] [phone] Fax [phone] Main  CONTACT INFORMATION:  Weekday (9AM-5PM): Call [facility] main office at [phone]  Weeknight (5PM-9AM) or Weekend/Holiday: Check [email or web address] (password " [email or web address]") for General Surgery [facility] coverage  (Please, do  not use SecureChat as it is not reliable communication to reach operating surgeons for immediate patient care given surgeries/outpatient duties/clinic/cross-coverage/off post-call which would lead to a delay in care.  Epic staff messaging available for outptient concerns, but may not be answered for 48 hours or more).     07/23/2022  6:27 AM

## 2022-07-23 NOTE — Progress Notes (Addendum)
[facility] Progress Note  Patient: Thomas [name]     [id number]  DOA: 07/01/2022   PCP: [name], NP       Brief hospital course: This is a 67 year old male with hypertension, diabetes mellitus, history of stroke seizures, meningioma and colon cancer s/p transverse colectomy admitted for elective robotic ostomy takedown with lysis of adhesions, incarcerated incisional hernia repair on 07/01/22  who was admitted to the hospital after an elective colostomy takedown.  Postop course was complicated by delirium and hypoxia related to atelectasis/ RLL collapse, mucous plugging and aspiration.  [facility] are consultants.  Subjective:   No new complaints.  He is asking if the candy he ate yesterday or the apple juice he drank once yesterday might of made him vomit  Assessment and Plan: Principal Problem:   Colon cancer  - General surgery has ordered a CT of the abdomen pelvis with contraston 3/13 which continued to show a post op ileus - diet advanced to solids but still not tolerating liquids-he vomited last night- he is not eating or drinking anything and abdomen is still distended with poor bowel sounds - He did have a small bowel movement yesterday he states --patient is on TPN, Reglan 10 mg 3 times daily, daily Dulcolax orally, IV erythromycin, MiraLAX daily, Mylicon twice a day as needed Zofran and fentanyl - He has no electrolyte disturbances however will order 40 mEq of potassium to get his potassium level above 4-it is currently 3.9    Active Problems: B/l pleural effusions - noted on CT of abd/pelvis on 3/13 - TPN at 50 cc/hr w/o any additional fluids - has been having good urine output - No respiratory distress or hypoxia  Delirium - Felt to be secondary to pain medications - She last received Seroquel on 3/9 - Noted to be quite oriented on my exam today - DC'd PRN  Seroquel  Epilepsy - Continue Keppra-changed to IV as he is still vomiting and may not  be absorbing oral  Hypertension - BP slightly elevated and he is receiving amlodipine 10 mg daily and IV metoprolol 5 mg every 6 hours as needed   CKD stage 3a - follow PRN  Microcytic anemia - Has low Iron stores-iron level is 28 and iron saturation is 14 - Will start oral Iron when tolerating a diet  Thrombocytosis - new since 3/1 -  secondary to iron deficiency vs stress?      Code Status: Full Code  Objective:   Vitals:   07/22/22 1236 07/22/22 2028 07/23/22 0500 07/23/22 1422  BP: 133/71 138/70 (!) 140/76 139/75  Pulse: 87 85 86 (!) 106  Resp: 18 17 18 16   Temp: 98.3 F (36.8 C) 98.5 F (36.9 C) 99 F (37.2 C) 98.4 F (36.9 C)  TempSrc: Oral Oral Axillary Oral  SpO2: 96% 96% 97% 97%  Weight:   92.3 kg   Height:       Filed Weights   07/18/22 0500 07/19/22 0708 07/23/22 0500  Weight: 95 kg 94.8 kg 92.3 kg   Exam: General exam: Appears comfortable  HEENT: oral mucosa moist Respiratory system: Clear to auscultation.  Cardiovascular system: S1 & S2 heard  Gastrointestinal system: Abdomen soft, distended, tympanic, Normal bowel sounds   Extremities: No cyanosis, clubbing or edema Psychiatry:  Mood & affect appropriate.     CBC: Recent Labs  Lab 07/17/22 0159 07/19/22 1509 07/20/22 0543  WBC 13.8* 11.5* 12.5*  HGB 8.1* 8.7* 8.0*  HCT 23.3* 26.0* 23.4*  MCV 76.4* 75.8* 75.7*  PLT 565* 570* 530*    Basic Metabolic Panel: Recent Labs  Lab 07/19/22 0241 07/19/22 1509 07/20/22 0543 07/22/22 0209 07/23/22 0120  NA 133* 132* 133* 136 136  K 4.3 4.5 4.5 3.9 3.9  CL 102 104 101 105 108  CO2 21* 23 24 24 23   GLUCOSE 123* 138* 134* 126* 150*  BUN 37* 30* 38* 36* 36*  CREATININE 1.59* 1.62* 1.62* 1.66* 1.57*  CALCIUM 8.3* 8.0* 8.2* 8.1* 8.1*  MG 1.9  --   --  2.1 2.1  PHOS 3.6  --   --  3.4 3.4    GFR: Estimated Creatinine Clearance: 51.8 mL/min (A) (by C-G formula based on SCr of 1.57 mg/dL (H)).  Scheduled Meds:  allopurinol  100 mg Oral QHS    amLODipine  10 mg Oral q AM   bisacodyl  10 mg Rectal Daily   Chlorhexidine Gluconate Cloth  6 each Topical Daily   enoxaparin (LOVENOX) injection  40 mg Subcutaneous Q24H   feeding supplement  237 mL Oral BID BM   gabapentin  100 mg Oral Daily   gabapentin  200 mg Oral QHS   heparin lock flush  500 Units Intracatheter Q30 days   [START ON 07/24/2022] insulin aspart  0-15 Units Subcutaneous Q6H   levETIRAcetam  500 mg Oral BID   lip balm   Topical BID   metoCLOPramide  10 mg Oral TID AC & HS   pantoprazole  40 mg Oral Daily   polyethylene glycol  34 g Oral q morning   senna  2 tablet Oral Daily   simethicone  80 mg Oral BID   sodium chloride flush  10-40 mL Intracatheter Q12H   Continuous Infusions:  sodium chloride 10 mL/hr at 07/21/22 1800   sodium chloride Stopped (07/11/22 0753)   erythromycin 250 mg (07/23/22 1332)   potassium chloride 10 mEq (07/23/22 1458)   TPN ADULT (ION) 50 mL/hr at 07/22/22 1729   TPN ADULT (ION)     Imaging and lab data was personally reviewed CT ABDOMEN PELVIS W CONTRAST  Result Date: 07/21/2022 CLINICAL DATA:  Postoperative abdominal pain, colon cancer status post transverse colon resection * Tracking Code: BO * EXAM: CT ABDOMEN AND PELVIS WITH CONTRAST TECHNIQUE: Multidetector CT imaging of the abdomen and pelvis was performed using the standard protocol following bolus administration of intravenous contrast. RADIATION DOSE REDUCTION: This exam was performed according to the departmental dose-optimization program which includes automated exposure control, adjustment of the mA and/or kV according to patient size and/or use of iterative reconstruction technique. CONTRAST:  30mL OMNIPAQUE IOHEXOL 300 MG/ML SOLN, additional oral enteric contrast COMPARISON:  07/09/2022 FINDINGS: Lower chest: Moderate bilateral pleural effusions and associated atelectasis or consolidation. Coronary artery calcifications. Hepatobiliary: Unchanged hypodense lesion of the  anterior right lobe of the liver measuring 1.1 x 0.7 cm (series 2, image 17). No gallstones, gallbladder wall thickening, or biliary dilatation. Pancreas: Unremarkable. No pancreatic ductal dilatation or surrounding inflammatory changes. Spleen: Normal in size without significant abnormality. Adrenals/Urinary Tract: Adrenal glands are unremarkable. Simple, benign bilateral renal cortical cysts, for which no further follow-up or characterization is required. Kidneys are otherwise normal, without renal calculi, solid lesion, or hydronephrosis. Small volume air in the urinary bladder presumably secondary to catheterization. Stomach/Bowel: Stomach is within normal limits. Similar postoperative configuration of the bowel, with mildly distended and fluid-filled reanastomosed transverse colon measuring up to 7.2 cm in caliber (series 2, image 38). Fluid-filled, mildly distended loops of  small bowel throughout the central abdomen measuring up to 4.3 cm in caliber (series 2, image 64). Vascular/Lymphatic: Aortic atherosclerosis. No enlarged abdominal or pelvic lymph nodes. Reproductive: Mild prostatomegaly. Other: Small bilateral fat containing inguinal hernias.  No ascites. Musculoskeletal: No acute or significant osseous findings. IMPRESSION: 1. Similar postoperative configuration of the bowel, with mildly distended and fluid-filled reanastomosed transverse colon measuring up to 7.2 cm in caliber. 2. Fluid-filled, mildly distended loops of small bowel throughout the central abdomen measuring up to 4.3 cm in caliber. Findings are most consistent with postoperative ileus, gas and fluid present to the rectum. 3. Unchanged hypodense lesion of the anterior right lobe of the liver measuring 1.1 x 0.7 cm, incompletely characterized. 4. Moderate bilateral pleural effusions and associated atelectasis or consolidation. 5. Coronary artery disease. Aortic Atherosclerosis (ICD10-I70.0). Electronically Signed   By: Delanna Ahmadi M.D.    On: 07/21/2022 18:17    LOS: 22 days   Author: Debbe Odea  07/23/2022 3:07 PM  To contact [facility]>   Check the care team in [facility] and look for the attending/consulting [place] provider listed  Log into [email or web address] and use [place]'s universal password   Go to> "[facility]"  and find provider  If you still have difficulty reaching the provider, please page the [facility] (Director on Call) for the Hospitalists listed on amion

## 2022-07-24 DIAGNOSIS — C184 Malignant neoplasm of transverse colon: Secondary | ICD-10-CM | POA: Diagnosis not present

## 2022-07-24 LAB — GLUCOSE, CAPILLARY
Glucose-Capillary: 131 mg/dL — ABNORMAL HIGH (ref 70–99)
Glucose-Capillary: 141 mg/dL — ABNORMAL HIGH (ref 70–99)
Glucose-Capillary: 152 mg/dL — ABNORMAL HIGH (ref 70–99)
Glucose-Capillary: 152 mg/dL — ABNORMAL HIGH (ref 70–99)
Glucose-Capillary: 169 mg/dL — ABNORMAL HIGH (ref 70–99)

## 2022-07-24 MED ORDER — TRAVASOL 10 % IV SOLN
INTRAVENOUS | Status: DC
  Filled 2022-07-24: qty 1344

## 2022-07-24 MED ORDER — BOOST / RESOURCE BREEZE PO LIQD CUSTOM
1.0000 | Freq: Three times a day (TID) | ORAL | Status: DC
  Administered 2022-07-24 – 2022-07-27 (×8): 1 via ORAL

## 2022-07-24 NOTE — Progress Notes (Signed)
PHARMACY - TOTAL PARENTERAL NUTRITION CONSULT NOTE   Indication: Prolonged ileus  Patient Measurements: Height: 5\' 8"  (172.7 cm) Weight: 92.3 kg (203 lb 7.8 oz) IBW/kg (Calculated) : 68.4 TPN AdjBW (KG): 73.1 Body mass index is 30.94 kg/m.  Assessment: 67 yo male with hx colon cancer diagnosed 2022 s/p transverse colectomy admitted for elective robotic ostomy takedown with lysis of adhesions, incarcerated incisional hernia repair on 07/01/22. Post-op course complicated by right lower lobe collapse and hypoxemia. Pharmacy consulted for TPN on 2/29 d/t prolonged ileus.  Recent Labs    07/22/22 0209 07/23/22 0120  NA 136 136  K 3.9 3.9  CL 105 108  CO2 24 23  GLUCOSE 126* 150*  BUN 36* 36*  CREATININE 1.66* 1.57*  CALCIUM 8.1* 8.1*  PHOS 3.4 3.4  MG 2.1 2.1  ALBUMIN 2.2*  --   ALKPHOS 91  --   AST 25  --   ALT 60*  --   BILITOT 0.4  --   TRIG 86  --   PREALBUMIN 15*  --      Glucose / Insulin: Hx DM2 but A1c well controlled at 5.1 as of 2/22. SSI resumed 3/15, CBG this AM 169. Electrolytes: On 3/15 all lytes WNL, including CorrCa. Na at max concentration in TPN. Renal: CKD3 (baseline SCr ~1.5) - SCr stable and close to baseline; BUN elevated but stable - UOP remains adequate Hepatic:  T.bili, Alk Phos, TG all remain WNL -AST/ALT bumped to 6-7 x ULN on 3/8 but trended back down.  On 3/14 ALT slightly elevated (less than 1.5 x ULN). Intake / Output:. Erythromycin IV for gut motility and aggressive bowel regimen. Concern around trialing NG tube feeds again for risk of emesis / aspiration. -UOP: 1300 mL + 2 unmeasured. Did have an episode of emesis yesterday -MIVF: NS @ [facility] GI Imaging: - 2/27 AXR: improving postop pneumoperitoneum - 2/29 AXR: possible ileus  - 3/1 CT a/p: difficult to r/o anastomotic leak d/t contrast not reaching anastomosis, but no signs of abscess - 3/13: CT Abdomen Pelvis: Findings most consistent with post-op ileus - dilated bowel. No leak, abscess,  perforation, or ischemia. GI Surgeries / Procedures:  - 2/22: robotic colostomy takedown and lysis of adhesions  Central access: implanted port. Double lumen PICC placed 3/2 TPN start date: 2/29  Nutritional Goals: Goal TPN rate is 100 mL/hr (provides 134 g of protein, 336 g dextrose, and 2,400 kcals per day)  RD Assessment: pending Estimated Needs Total Energy Estimated Needs: 2400-2600 kcals Total Protein Estimated Needs: 125-140 grams Total Fluid Estimated Needs: >/= 2.4L  Current Nutrition: Plan to discharge to SNF once tolerating PO intake and meeting >50% of needs -Regular diet. -TPN at goal rate 100 mL/hr  Plan:  At 1800: Continue TPN at goal rate, 100 mL/hr Provides 134 g protein, 2,400 kcal Electrolytes in TPN: No changes Na 154 mEq/L (max concentration) K 15 mEq/L Ca 0 mEq/L Mg 4 mEq/L Phos 10 mmol/L Cl:Ac 1:1 Add MVI and trace elements to TPN  Continue moderate SSI and CBG checks Q6h mIVF at [facility]. Further management by MD Monitor TPN labs on Mon/Thurs Follow for ability to tolerate advancing diet   Clayburn Pert, PharmD, BCPS Clinical Pharmacist 07/24/2022  7:48 AM

## 2022-07-24 NOTE — Progress Notes (Signed)
S/p colostomy takedown Subjective: Tolerating a diet, had some nausea after drinking Ensure  Objective: Vital signs in last 24 hours: Temp:  [98.1 F (36.7 C)-98.4 F (36.9 C)] 98.1 F (36.7 C) (03/16 0533) Pulse Rate:  [93-106] 93 (03/16 0533) Resp:  [16-18] 18 (03/16 0533) BP: (136-144)/(66-75) 144/66 (03/16 0533) SpO2:  [97 %-98 %] 98 % (03/16 0533)   Intake/Output from previous day: 03/15 0701 - 03/16 0700 In: 1244 [P.O.:780; I.V.:279.7; IV Piggyback:184.4] Out: 1400 [Urine:1400] Intake/Output this shift: No intake/output data recorded.   General appearance: alert and cooperative GI: normal findings: soft, mild distention  Incision: healing well  Lab Results:  No results for input(s): "WBC", "HGB", "HCT", "PLT" in the last 72 hours. BMET Recent Labs    07/22/22 0209 07/23/22 0120  NA 136 136  K 3.9 3.9  CL 105 108  CO2 24 23  GLUCOSE 126* 150*  BUN 36* 36*  CREATININE 1.66* 1.57*  CALCIUM 8.1* 8.1*   PT/INR No results for input(s): "LABPROT", "INR" in the last 72 hours. ABG No results for input(s): "PHART", "HCO3" in the last 72 hours.  Invalid input(s): "PCO2", "PO2"  MEDS, Scheduled  allopurinol  100 mg Oral QHS   amLODipine  10 mg Oral q AM   bisacodyl  10 mg Rectal Daily   Chlorhexidine Gluconate Cloth  6 each Topical Daily   enoxaparin (LOVENOX) injection  40 mg Subcutaneous Q24H   feeding supplement  1 Container Oral TID BM   gabapentin  100 mg Oral Daily   gabapentin  200 mg Oral QHS   heparin lock flush  500 Units Intracatheter Q30 days   insulin aspart  0-15 Units Subcutaneous Q6H   lip balm   Topical BID   metoCLOPramide  10 mg Oral TID AC & HS   pantoprazole  40 mg Oral Daily   polyethylene glycol  34 g Oral q morning   senna  2 tablet Oral Daily   simethicone  80 mg Oral BID   sodium chloride flush  10-40 mL Intracatheter Q12H    Studies/Results: No results found.  Assessment: s/p Procedure(s): ROBOTIC OSTOMY TAKEDOWN WITH  INTRACORPOREAL ANASTOMOSIS WITH FIREFLY LYSIS OF ADHESION HERNIA REPAIR INGUINAL INCARCERATED Patient Active Problem List   Diagnosis Date Noted   Hyperglycemia 07/22/2022   Normocytic anemia 07/14/2022   Atelectasis 07/14/2022   Hypophosphatemia 07/08/2022   Hiccoughs 07/08/2022   Insomnia due to medical condition 07/07/2022   Hypokalemia 07/06/2022   CKD (chronic kidney disease) stage 3b 07/06/2022   Delirium 07/05/2022   History of colorectal cancer 07/01/2022   History of meningioma of the brain 06/07/2022   Incisional hernia, without obstruction or gangrene 06/07/2022   Gastroesophageal reflux disease without esophagitis 03/18/2021   Epilepsy ([place])    Hyperlipidemia    Peripheral neuropathy    Cerebral embolism with cerebral infarction 02/06/2021   Acute esophagitis    Duodenal ulcer disease    Palliative care by specialist    Acute respiratory failure with hypoxemia ([place]) 12/07/2020   Shock ([place])    Acute metabolic encephalopathy 0000000   Protein-calorie malnutrition, moderate ([place]) 12/03/2020   Atrial fibrillation with RVR (HCC)    Sepsis ([place])    Adverse effect of chemotherapy 12/01/2020   Pressure injury of skin 12/01/2020   Acute renal failure superimposed on stage 2 chronic kidney disease ([place]) 10/01/2020   Hyponatremia 10/01/2020   Iron deficiency anemia due to chronic blood loss 10/01/2020   Colon cancer ([place]) 09/23/2020   History  of ischemic left MCA stroke 2015 09/11/2020   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    Rectal polyp    Diverticulosis of colon without hemorrhage    Abdominal pain 09/04/2020   Diabetic polyneuropathy associated with type 2 diabetes mellitus ([place]) 06/28/2018   Venous insufficiency of both lower extremities 06/28/2018   S/P craniotomy 09/01/2016   Meningioma ([place]) 09/01/2016   Trauma    Type 2 diabetes mellitus with diabetic nephropathy, without long-term current use of insulin ([place])     Hypertension    Acute encephalopathy 04/09/2014    Post op ileus  Plan: Cont diet and bowel regimen Await better intake before d/c of TPN Minimize narcotic use Ambulate in hall   LOS: 23 days     .Rosario Adie, MD [facility], [place]    07/24/2022 9:46 AM

## 2022-07-24 NOTE — Progress Notes (Signed)
Mobility Specialist - Progress Note   07/24/22 1044  Mobility  Activity Ambulated with assistance in hallway  Level of Assistance Standby assist, set-up cues, supervision of patient - no hands on  Assistive Device Front wheel walker  Distance Ambulated (ft) 275 ft  Activity Response Tolerated well  Mobility Referral Yes  $Mobility charge 1 Mobility   Pt received in bed and agreeable to mobility. No complaints during session. Pt to bed after session with all needs met.    [facility]

## 2022-07-24 NOTE — Progress Notes (Signed)
[facility] Progress Note  Patient: Thomas [name]     [id number]  DOA: 07/01/2022   PCP: [name], NP       Brief hospital course: This is a 67 year old male with hypertension, diabetes mellitus, history of stroke seizures, meningioma and colon cancer s/p transverse colectomy admitted for elective robotic ostomy takedown with lysis of adhesions, incarcerated incisional hernia repair on 07/01/22  who was admitted to the hospital after an elective colostomy takedown.  Postop course was complicated by delirium and hypoxia related to atelectasis/ RLL collapse, mucous plugging and aspiration.  [facility] are consultants.  Subjective:   On and off nausea.   Assessment and Plan: Principal Problem:   Colon cancer  - General surgery has ordered a CT of the abdomen pelvis with contraston 3/13 which continued to show a post op ileus - diet advanced to solids but still not tolerating liquids-he vomited last night- he is not eating or drinking anything and abdomen is still distended with poor bowel sounds - He did have a small bowel movement yesterday he states --patient is on TPN, Reglan 10 mg 3 times daily, daily Dulcolax orally, IV erythromycin, MiraLAX daily, Mylicon twice a day as needed Zofran and fentanyl  Active Problems: B/l pleural effusions - noted on CT of abd/pelvis on 3/13 - TPN at 50 cc/hr w/o any additional fluids - has been having good urine output - No respiratory distress or hypoxia  Delirium - Felt to be secondary to pain medications - She last received Seroquel on 3/9 - Noted to be quite oriented on my exam today - DC'd PRN  Seroquel  Epilepsy - Continue Keppra-changed to IV as he is still vomiting and may not be absorbing oral  Hypertension - BP slightly elevated and he is receiving amlodipine 10 mg daily and IV metoprolol 5 mg every 6 hours as needed   CKD stage 3a - follow PRN  Microcytic anemia - Has low Iron stores-iron level is 28 and  iron saturation is 14 - Will start oral Iron when tolerating a diet  Thrombocytosis - new since 3/1 -  secondary to iron deficiency vs stress?      Code Status: Full Code  Objective:   Vitals:   07/23/22 1422 07/23/22 2021 07/24/22 0533 07/24/22 1355  BP: 139/75 136/66 (!) 144/66 (!) 146/76  Pulse: (!) 106 96 93 98  Resp: 16 18 18 16   Temp: 98.4 F (36.9 C) 98.4 F (36.9 C) 98.1 F (36.7 C) 98.2 F (36.8 C)  TempSrc: Oral Oral Oral Oral  SpO2: 97% 98% 98% 98%  Weight:      Height:       Filed Weights   07/18/22 0500 07/19/22 0708 07/23/22 0500  Weight: 95 kg 94.8 kg 92.3 kg   Exam: General exam: Appears comfortable  HEENT: oral mucosa moist Respiratory system: Clear to auscultation.  Cardiovascular system: S1 & S2 heard  Gastrointestinal system: Abdomen soft, non-tender, still moderate to severely distended. Normal bowel sounds   Extremities: No cyanosis, clubbing or edema Psychiatry:  Mood & affect appropriate.     CBC: Recent Labs  Lab 07/19/22 1509 07/20/22 0543  WBC 11.5* 12.5*  HGB 8.7* 8.0*  HCT 26.0* 23.4*  MCV 75.8* 75.7*  PLT 570* 530*    Basic Metabolic Panel: Recent Labs  Lab 07/19/22 0241 07/19/22 1509 07/20/22 0543 07/22/22 0209 07/23/22 0120  NA 133* 132* 133* 136 136  K 4.3 4.5 4.5 3.9 3.9  CL 102 104 101  105 108  CO2 21* 23 24 24 23   GLUCOSE 123* 138* 134* 126* 150*  BUN 37* 30* 38* 36* 36*  CREATININE 1.59* 1.62* 1.62* 1.66* 1.57*  CALCIUM 8.3* 8.0* 8.2* 8.1* 8.1*  MG 1.9  --   --  2.1 2.1  PHOS 3.6  --   --  3.4 3.4    GFR: Estimated Creatinine Clearance: 51.8 mL/min (A) (by C-G formula based on SCr of 1.57 mg/dL (H)).  Scheduled Meds:  allopurinol  100 mg Oral QHS   amLODipine  10 mg Oral q AM   bisacodyl  10 mg Rectal Daily   Chlorhexidine Gluconate Cloth  6 each Topical Daily   enoxaparin (LOVENOX) injection  40 mg Subcutaneous Q24H   feeding supplement  1 Container Oral TID BM   gabapentin  100 mg Oral Daily    gabapentin  200 mg Oral QHS   heparin lock flush  500 Units Intracatheter Q30 days   insulin aspart  0-15 Units Subcutaneous Q6H   lip balm   Topical BID   metoCLOPramide  10 mg Oral TID AC & HS   pantoprazole  40 mg Oral Daily   polyethylene glycol  34 g Oral q morning   senna  2 tablet Oral Daily   simethicone  80 mg Oral BID   sodium chloride flush  10-40 mL Intracatheter Q12H   Continuous Infusions:  sodium chloride 10 mL/hr at 07/21/22 1800   sodium chloride Stopped (07/11/22 0753)   erythromycin 250 mg (07/24/22 1403)   levETIRAcetam 500 mg (07/24/22 1549)   TPN ADULT (ION) 100 mL/hr at 07/23/22 1707   TPN ADULT (ION)     Imaging and lab data was personally reviewed No results found.  LOS: 23 days   Author: Debbe Odea  07/24/2022 4:31 PM  To contact [facility]>   Check the care team in [facility] and look for the attending/consulting [place] provider listed  Log into [email or web address] and use [place]'s universal password   Go to> "[facility]"  and find provider  If you still have difficulty reaching the provider, please page the [facility] (Director on Call) for the Hospitalists listed on amion

## 2022-07-25 DIAGNOSIS — C184 Malignant neoplasm of transverse colon: Secondary | ICD-10-CM | POA: Diagnosis not present

## 2022-07-25 LAB — BASIC METABOLIC PANEL
Anion gap: 6 (ref 5–15)
BUN: 32 mg/dL — ABNORMAL HIGH (ref 8–23)
CO2: 23 mmol/L (ref 22–32)
Calcium: 8 mg/dL — ABNORMAL LOW (ref 8.9–10.3)
Chloride: 109 mmol/L (ref 98–111)
Creatinine, Ser: 1.34 mg/dL — ABNORMAL HIGH (ref 0.61–1.24)
GFR, Estimated: 59 mL/min — ABNORMAL LOW (ref >60)
Glucose, Bld: 146 mg/dL — ABNORMAL HIGH (ref 70–99)
Potassium: 3.4 mmol/L — ABNORMAL LOW (ref 3.5–5.1)
Sodium: 138 mmol/L (ref 135–145)

## 2022-07-25 LAB — CBC
HCT: 22.9 % — ABNORMAL LOW (ref 39.0–52.0)
Hemoglobin: 7.6 g/dL — ABNORMAL LOW (ref 13.0–17.0)
MCH: 25.2 pg — ABNORMAL LOW (ref 26.0–34.0)
MCHC: 33.2 g/dL (ref 30.0–36.0)
MCV: 76.1 fL — ABNORMAL LOW (ref 80.0–100.0)
Platelets: 465 10*3/uL — ABNORMAL HIGH (ref 150–400)
RBC: 3.01 MIL/uL — ABNORMAL LOW (ref 4.22–5.81)
RDW: 15.1 % (ref 11.5–15.5)
WBC: 12.1 10*3/uL — ABNORMAL HIGH (ref 4.0–10.5)
nRBC: 0 % (ref 0.0–0.2)

## 2022-07-25 LAB — GLUCOSE, CAPILLARY
Glucose-Capillary: 127 mg/dL — ABNORMAL HIGH (ref 70–99)
Glucose-Capillary: 137 mg/dL — ABNORMAL HIGH (ref 70–99)
Glucose-Capillary: 157 mg/dL — ABNORMAL HIGH (ref 70–99)
Glucose-Capillary: 184 mg/dL — ABNORMAL HIGH (ref 70–99)

## 2022-07-25 MED ORDER — TRAVASOL 10 % IV SOLN: INTRAVENOUS | Status: DC

## 2022-07-25 MED ORDER — POTASSIUM CHLORIDE 10 MEQ/50ML IV SOLN
10.0000 meq | INTRAVENOUS | Status: AC
  Administered 2022-07-25 (×3): 10 meq via INTRAVENOUS
  Filled 2022-07-25 (×3): qty 50

## 2022-07-25 MED ORDER — TRAVASOL 10 % IV SOLN
INTRAVENOUS | Status: DC
  Filled 2022-07-25: qty 1008

## 2022-07-25 MED ORDER — TRAVASOL 10 % IV SOLN
INTRAVENOUS | Status: DC
  Filled 2022-07-25: qty 1344

## 2022-07-25 NOTE — Progress Notes (Signed)
Occupational Therapy Treatment Patient Details Name: Thomas [name] MRN: [id number] DOB: 1956/03/17 Today's Date: 07/25/2022   History of present illness Patient is a 67 yo male admitted 07/01/22  S/P LYSIS OF ADHESIONS , PRIMARY REPAIR OF INCARCERATED INCISIONAL HERNIA , TAKEDOWN OF END COLOSTOMY WITH ANASTOMOSIS.2/24 moved to stepdown unit for hypoxemia, right lower lobe collapse CT chest showed debris in right lower lobe with atelectasis. Now with post op ileus, asp pneumonitis. PMH, colon and rectal  cancer, colostomy, VDRF with trach 2022,   DVT, afib, DM, HTN, seizure, CVA, fromtal meningioma, lobectomy   OT comments  Patient was able to participate in ADL tasks in standing with no rest break which is an improvement from last session. Patient was able to complete grooming tasks standing at sink supervision and toileting hygiene after incontinence upon getting out of bed with min A. Patient is motivated to progress towards d/c from hospital. Patient's discharge plan remains appropriate at this time. OT will continue to follow acutely.     Recommendations for follow up therapy are one component of a multi-disciplinary discharge planning process, led by the attending physician.  Recommendations may be updated based on patient status, additional functional criteria and insurance authorization.    Follow Up Recommendations  Skilled nursing-short term rehab (<3 hours/day)     Assistance Recommended at Discharge Frequent or constant Supervision/Assistance  Patient can return home with the following  A lot of help with bathing/dressing/bathroom;Assistance with cooking/housework;Direct supervision/assist for medications management;Assist for transportation;Direct supervision/assist for financial management;Help with stairs or ramp for entrance;A little help with walking and/or transfers   Equipment Recommendations  BSC/3in1       Precautions / Restrictions Precautions Precautions:  Fall Precaution Comments: abdominal, ILEUS Restrictions Weight Bearing Restrictions: No       Mobility Bed Mobility Overal bed mobility: Needs Assistance Bed Mobility: Supine to Sit   Sidelying to sit: Supervision                     ADL either performed or assessed with clinical judgement   ADL Overall ADL's : Needs assistance/impaired     Grooming: Standing;Oral care;Wash/dry face;Supervision/safety Grooming Details (indicate cue type and reason): with RW   Toileting- Clothing Manipulation and Hygiene: Minimal assistance;Sit to/from stand Toileting - Clothing Manipulation Details (indicate cue type and reason): patient was able to participate in one hand UE support on RW and washing bottom after bowel movement. patient was able to complete whole session in standing with no rest break on this date. patient was educated on importance of participation in ADLs in morning and afternoon. patient verbalized understanding and reported he would work on it,.     Functional mobility during ADLs: Min guard;Rolling walker (2 wheels) General ADL Comments: in hallway whole length from room to both ends and back into room. wife was with patient to motivate during mobility.      Cognition Arousal/Alertness: Awake/alert Behavior During Therapy: WFL for tasks assessed/performed Overall Cognitive Status: Within Functional Limits for tasks assessed       General Comments: plesant and cooperative, wife was present during session                   Pertinent Vitals/ Pain       Pain Assessment Pain Assessment: Faces Faces Pain Scale: Hurts a little bit Pain Location: ABD Pain Descriptors / Indicators: Discomfort, Grimacing, Tightness Pain Intervention(s): Limited activity within patient's tolerance, Monitored during session  Frequency  Min 2X/week        Progress Toward Goals  OT Goals(current goals can now be found in the care plan section)  Progress  towards OT goals: Progressing toward goals     Plan Discharge plan remains appropriate       AM-PAC OT "6 Clicks" Daily Activity     Outcome Measure   Help from another person eating meals?: None Help from another person taking care of personal grooming?: A Little Help from another person toileting, which includes using toliet, bedpan, or urinal?: A Little Help from another person bathing (including washing, rinsing, drying)?: A Lot Help from another person to put on and taking off regular upper body clothing?: A Little Help from another person to put on and taking off regular lower body clothing?: A Lot 6 Click Score: 17    End of Session Equipment Utilized During Treatment: Rolling walker (2 wheels)  OT Visit Diagnosis: Unsteadiness on feet (R26.81);Other abnormalities of gait and mobility (R26.89);Muscle weakness (generalized) (M62.81);Pain   Activity Tolerance Patient tolerated treatment well   Patient Left in chair;with call bell/phone within reach;with family/visitor present   Nurse Communication Other (comment) (ok to participate in session, updated afterwards)        Time: [id number] OT Time Calculation (min): 26 min  Charges: OT General Charges $OT Visit: 1 Visit OT Treatments $Self Care/Home Management : 23-37 mins  [name], MS Acute Rehabilitation Department Office# [phone]   [name] 07/25/2022, 9:37 AM

## 2022-07-25 NOTE — Progress Notes (Signed)
24 Days Post-Op   Subjective/Chief Complaint: Patient feeling better.  Having bowel function.  Still feels bloated though.  Hemoglobin 7.6.  No symptoms of orthostatic or dizziness reported   Objective: Vital signs in last 24 hours: Temp:  [98.2 F (36.8 C)-98.5 F (36.9 C)] 98.5 F (36.9 C) (03/17 0537) Pulse Rate:  [89-98] 89 (03/17 0537) Resp:  [16-18] 18 (03/17 0537) BP: (124-146)/(55-76) 138/60 (03/17 0537) SpO2:  [98 %-100 %] 100 % (03/17 0537) Last BM Date : 07/25/22  Intake/Output from previous day: 03/16 0701 - 03/17 0700 In: 3101 [P.O.:560; I.V.:2134.6; IV Piggyback:406.5] Out: 1650 [Urine:1650] Intake/Output this shift: No intake/output data recorded.   General appearance: alert and cooperative GI: normal findings: soft, mild distention Incision clean dry intact Lab Results:  Recent Labs    07/25/22 0254  WBC 12.1*  HGB 7.6*  HCT 22.9*  PLT 465*   BMET Recent Labs    07/23/22 0120 07/25/22 0254  NA 136 138  K 3.9 3.4*  CL 108 109  CO2 23 23  GLUCOSE 150* 146*  BUN 36* 32*  CREATININE 1.57* 1.34*  CALCIUM 8.1* 8.0*   PT/INR No results for input(s): "LABPROT", "INR" in the last 72 hours. ABG No results for input(s): "PHART", "HCO3" in the last 72 hours.  Invalid input(s): "PCO2", "PO2"  Studies/Results: No results found.  Anti-infectives: Anti-infectives (From admission, onward)    Start     Dose/Rate Route Frequency Ordered Stop   07/22/22 2000  erythromycin 250 mg in sodium chloride 0.9 % 100 mL IVPB        250 mg 100 mL/hr over 60 Minutes Intravenous Every 8 hours 07/22/22 1903     07/22/22 0800  erythromycin 250 mg in sodium chloride 0.9 % 100 mL IVPB  Status:  Discontinued        250 mg 100 mL/hr over 60 Minutes Intravenous Every 8 hours 07/22/22 0713 07/22/22 0728   07/10/22 0400  piperacillin-tazobactam (ZOSYN) IVPB 3.375 g  Status:  Discontinued        3.375 g 12.5 mL/hr over 240 Minutes Intravenous Every 8 hours 07/09/22 2040  07/10/22 0738   07/08/22 1100  piperacillin-tazobactam (ZOSYN) IVPB 3.375 g  Status:  Discontinued        3.375 g 12.5 mL/hr over 240 Minutes Intravenous Every 8 hours 07/08/22 1001 07/09/22 2040   07/08/22 1000  erythromycin 250 mg in sodium chloride 0.9 % 100 mL IVPB  Status:  Discontinued        250 mg 100 mL/hr over 60 Minutes Intravenous Every 12 hours 07/08/22 0739 07/09/22 0730   07/06/22 0915  erythromycin 250 mg in sodium chloride 0.9 % 100 mL IVPB  Status:  Discontinued        250 mg 100 mL/hr over 60 Minutes Intravenous Every 8 hours 07/06/22 0823 07/08/22 0739   07/03/22 1700  piperacillin-tazobactam (ZOSYN) IVPB 3.375 g        3.375 g 12.5 mL/hr over 240 Minutes Intravenous Every 8 hours 07/03/22 1612 07/08/22 0450   07/02/22 0000  cefoTEtan (CEFOTAN) 2 g in sodium chloride 0.9 % 100 mL IVPB        2 g 200 mL/hr over 30 Minutes Intravenous Every 12 hours 07/01/22 1737 07/02/22 0051   07/01/22 1400  neomycin (MYCIFRADIN) tablet 1,000 mg  Status:  Discontinued       See Hyperspace for full Linked Orders Report.   1,000 mg Oral 3 times per day 07/01/22 1054 07/01/22 1109  07/01/22 1400  metroNIDAZOLE (FLAGYL) tablet 1,000 mg  Status:  Discontinued       See Hyperspace for full Linked Orders Report.   1,000 mg Oral 3 times per day 07/01/22 1054 07/01/22 1109   07/01/22 1100  cefoTEtan (CEFOTAN) 2 g in sodium chloride 0.9 % 100 mL IVPB        2 g 200 mL/hr over 30 Minutes Intravenous On call to O.R. 07/01/22 1054 07/01/22 1204       Assessment/Plan: s/p Procedure(s): ROBOTIC OSTOMY TAKEDOWN WITH INTRACORPOREAL ANASTOMOSIS WITH FIREFLY (N/A) LYSIS OF ADHESION (N/A) HERNIA REPAIR INGUINAL INCARCERATED (N/A) Reduced rate of TNA  Continue ambulation  Continue present diet   Acute on chronic anemia-appears stable for now.  Monitor.  LOS: 24 days    Turner Daniels MD 07/25/2022

## 2022-07-25 NOTE — Progress Notes (Signed)
PHARMACY - TOTAL PARENTERAL NUTRITION CONSULT NOTE   Indication: Prolonged ileus  Patient Measurements: Height: 5\' 8"  (172.7 cm) Weight: 92.3 kg (203 lb 7.8 oz) IBW/kg (Calculated) : 68.4 TPN AdjBW (KG): 73.1 Body mass index is 30.94 kg/m.  Assessment: 67 yo male with hx colon cancer diagnosed 2022 s/p transverse colectomy admitted for elective robotic ostomy takedown with lysis of adhesions, incarcerated incisional hernia repair on 07/01/22. Post-op course complicated by right lower lobe collapse and hypoxemia. Pharmacy consulted for TPN on 2/29 d/t prolonged ileus.  Recent Labs    07/23/22 0120 07/25/22 0254  NA 136 138  K 3.9 3.4*  CL 108 109  CO2 23 23  GLUCOSE 150* 146*  BUN 36* 32*  CREATININE 1.57* 1.34*  CALCIUM 8.1* 8.0*  PHOS 3.4  --   MG 2.1  --       Glucose / Insulin: Hx DM2 but A1c well controlled at 5.1 as of 2/22. SSI resumed 3/15  - CBG: most at goal < 180 - 14 units SSI required in past 24hr. Electrolytes: K low, CorrCa WNL, Na at max concentration in TPN. Renal: CKD3 (baseline SCr ~1.5) - SCr stable and close to baseline; BUN elevated but stable - UOP remains adequate Hepatic:  on 3/14: T.bili, Alk Phos, TG all remain WNL -AST/ALT bumped to 6-7 x ULN on 3/8 but trended back down.  On 3/14 ALT slightly elevated (less than 1.5 x ULN). Intake / Output:. Erythromycin IV for gut motility and aggressive bowel regimen. Concern around trialing NG tube feeds again for risk of emesis / aspiration. -UOP: 1650 mL  -4 unmeasured BMs, daily dulcolax suppos, miralax, senokot, emycin, reglan -MIVF: NS @ KVO GI Imaging: - 2/27 AXR: improving postop pneumoperitoneum - 2/29 AXR: possible ileus  - 3/1 CT a/p: difficult to r/o anastomotic leak d/t contrast not reaching anastomosis, but no signs of abscess - 3/13: CT Abdomen Pelvis: Findings most consistent with post-op ileus - dilated bowel. No leak, abscess, perforation, or ischemia. GI Surgeries / Procedures:  - 2/22:  robotic colostomy takedown and lysis of adhesions  Central access: implanted port. Double lumen PICC placed 3/2 TPN start date: 2/29  Nutritional Goals: Goal TPN rate is 100 mL/hr (provides 134 g of protein, 336 g dextrose, and 2,400 kcals per day)  RD Assessment: pending Estimated Needs Total Energy Estimated Needs: 2400-2600 kcals Total Protein Estimated Needs: 125-140 grams Total Fluid Estimated Needs: >/= 2.4L  Current Nutrition: Plan to discharge to SNF once tolerating PO intake and meeting >50% of needs -Regular diet. -Boost TID - drank 1 yesterday -TPN at goal rate 100 mL/hr  Plan:   Now: KCl 52mEq IV x 3 runs  At 1800: Continue TPN at goal rate, 100 mL/hr Provides 134 g protein, 2,400 kcal Electrolytes in TPN:  Na 154 mEq/L (max concentration) K 20 mEq/L (increase) Ca 0 mEq/L Mg 4 mEq/L Phos 10 mmol/L Cl:Ac 1:1 Add MVI and trace elements to TPN  Continue moderate SSI and CBG checks Q6h mIVF at [facility]. Further management by MD Monitor TPN labs on Mon/Thurs Follow for ability to tolerate advancing diet   Peggyann Juba, PharmD, BCPS Pharmacy: [phone] 07/25/2022  7:26 AM

## 2022-07-25 NOTE — Progress Notes (Signed)
[facility] Progress Note  Patient: Thomas [name]     [id number]  DOA: 07/01/2022   PCP: [name], NP       Brief hospital course: This is a 67 year old male with hypertension, diabetes mellitus, history of stroke seizures, meningioma and colon cancer s/p transverse colectomy admitted for elective robotic ostomy takedown with lysis of adhesions, incarcerated incisional hernia repair on 07/01/22  who was admitted to the hospital after an elective colostomy takedown.  Postop course was complicated by delirium and hypoxia related to atelectasis/ RLL collapse, mucous plugging and aspiration.  [facility] are consultants.  Subjective:  Patient was seen and examined this morning, stable sitting up in chair, accompanied by his wife Report he is doing well, he is willing to advance his diet today Waiting for his breakfast Still feels bloated, but reporting of gas and BM Hemoglobin at 7.6   Assessment and Plan: Principal Problem:   Colon cancer  -Status post robotic ostomy takedown with anastomosis relief, lysis adhesion, hernia repair inguinal.  - General surgery has ordered a CT of the abdomen pelvis with contraston 3/13 which continued to show a post op ileus -Reporting improved nausea vomiting, willing to advance his diet Reporting of gas and bowel movements  --patient On TPN, Reglan 10 mg 3 times daily, daily Dulcolax orally, IV erythromycin, MiraLAX daily, Mylicon twice a day as needed Zofran and fentanyl   -further management by general surgery   Active Problems:  B/l pleural effusions - noted on CT of abd/pelvis on 3/13 - On TPN at 50 cc/hr w/o any additional fluids>>> planning to discontinue once tolerating p.o. - has been having good urine output - No respiratory distress or hypoxia  Delirium - Felt to be secondary to pain medications - Noted to be quite oriented on my exam today - DC'd PRN  Seroquel  Epilepsy - Continue Keppra-changed to IV .Marland KitchenMarland Kitchen  Will continue till he tolerates p.o. then will switch back   Hypertension - BP stable  -Continue oral medication Norvasc, -Continue as needed hydralazine and metoprolol   Hypokalemia  -Repleting, checking magnesium level  CKD stage 3a - follow PRN  Microcytic anemia - Has low Iron stores-iron level is 28 and iron saturation is 14 - Will start oral Iron when tolerating a diet  Thrombocytosis - new since 3/1 -  secondary to iron deficiency vs stress?      Code Status: Full Code Discussed with patient's wife at bedside  Disposition: pending discharge to SNF in 1-2 days   Objective:   Vitals:   07/24/22 0533 07/24/22 1355 07/24/22 2009 07/25/22 0537  BP: (!) 144/66 (!) 146/76 (!) 124/55 138/60  Pulse: 93 98 90 89  Resp: 18 16 18 18   Temp: 98.1 F (36.7 C) 98.2 F (36.8 C) 98.4 F (36.9 C) 98.5 F (36.9 C)  TempSrc: Oral Oral Oral Oral  SpO2: 98% 98% 98% 100%  Weight:      Height:       Filed Weights   07/18/22 0500 07/19/22 0708 07/23/22 0500  Weight: 95 kg 94.8 kg 92.3 kg   Exam: General:  AAO x 3,  cooperative, no distress;   HEENT:  Normocephalic, PERRL, otherwise with in Normal limits   Neuro:  CNII-XII intact. , normal motor and sensation, reflexes intact   Lungs:   Clear to auscultation BL, Respirations unlabored,  No wheezes / crackles  Cardio:    S1/S2, RRR, No murmure, No Rubs or Gallops   Abdomen:  Soft,  non-tender, bowel sounds active all four quadrants, no guarding or peritoneal signs.  Muscular  skeletal:  Limited exam -global generalized weaknesses - in bed, able to move all 4 extremities,   2+ pulses,  symmetric, No pitting edema  Skin:  Dry, warm to touch, negative for any Rashes, Surgical wounds inspected, dry clean, negative for erythema edema or drainage  Wounds: Please see nursing documentation         CBC: Recent Labs  Lab 07/19/22 1509 07/20/22 0543 07/25/22 0254  WBC 11.5* 12.5* 12.1*  HGB 8.7* 8.0* 7.6*  HCT 26.0*  23.4* 22.9*  MCV 75.8* 75.7* 76.1*  PLT 570* 530* 123XX123*   Basic Metabolic Panel: Recent Labs  Lab 07/19/22 0241 07/19/22 1509 07/20/22 0543 07/22/22 0209 07/23/22 0120 07/25/22 0254  NA 133* 132* 133* 136 136 138  K 4.3 4.5 4.5 3.9 3.9 3.4*  CL 102 104 101 105 108 109  CO2 21* 23 24 24 23 23   GLUCOSE 123* 138* 134* 126* 150* 146*  BUN 37* 30* 38* 36* 36* 32*  CREATININE 1.59* 1.62* 1.62* 1.66* 1.57* 1.34*  CALCIUM 8.3* 8.0* 8.2* 8.1* 8.1* 8.0*  MG 1.9  --   --  2.1 2.1  --   PHOS 3.6  --   --  3.4 3.4  --    GFR: Estimated Creatinine Clearance: 60.6 mL/min (A) (by C-G formula based on SCr of 1.34 mg/dL (H)).  Scheduled Meds:  allopurinol  100 mg Oral QHS   amLODipine  10 mg Oral q AM   bisacodyl  10 mg Rectal Daily   Chlorhexidine Gluconate Cloth  6 each Topical Daily   enoxaparin (LOVENOX) injection  40 mg Subcutaneous Q24H   feeding supplement  1 Container Oral TID BM   gabapentin  100 mg Oral Daily   gabapentin  200 mg Oral QHS   heparin lock flush  500 Units Intracatheter Q30 days   insulin aspart  0-15 Units Subcutaneous Q6H   lip balm   Topical BID   metoCLOPramide  10 mg Oral TID AC & HS   pantoprazole  40 mg Oral Daily   polyethylene glycol  34 g Oral q morning   senna  2 tablet Oral Daily   simethicone  80 mg Oral BID   sodium chloride flush  10-40 mL Intracatheter Q12H   Continuous Infusions:  sodium chloride 10 mL/hr at 07/21/22 1800   sodium chloride Stopped (07/11/22 0753)   erythromycin 250 mg (07/25/22 0556)   levETIRAcetam 500 mg (07/25/22 0407)   potassium chloride 10 mEq (07/25/22 1026)   TPN ADULT (ION)     Imaging and lab data was personally reviewed No results found.  LOS: 24 days   Author: Deatra James MD  07/25/2022 10:34 AM  To contact [facility]>   Check the care team in [facility] and look for the attending/consulting [place] provider listed  Log into [email or web address] and use [place]'s universal password   Go to> "[facility]"  and find provider  If you still have difficulty reaching the provider, please page the [facility] (Director on Call) for the Hospitalists listed on amion

## 2022-07-26 ENCOUNTER — Encounter (HOSPITAL_COMMUNITY): Payer: Self-pay | Admitting: Surgery

## 2022-07-26 ENCOUNTER — Other Ambulatory Visit (HOSPITAL_COMMUNITY): Payer: Self-pay

## 2022-07-26 DIAGNOSIS — D649 Anemia, unspecified: Secondary | ICD-10-CM | POA: Diagnosis not present

## 2022-07-26 DIAGNOSIS — G40909 Epilepsy, unspecified, not intractable, without status epilepticus: Secondary | ICD-10-CM | POA: Diagnosis not present

## 2022-07-26 DIAGNOSIS — C184 Malignant neoplasm of transverse colon: Secondary | ICD-10-CM | POA: Diagnosis not present

## 2022-07-26 DIAGNOSIS — Z9889 Other specified postprocedural states: Secondary | ICD-10-CM

## 2022-07-26 DIAGNOSIS — J90 Pleural effusion, not elsewhere classified: Secondary | ICD-10-CM | POA: Insufficient documentation

## 2022-07-26 DIAGNOSIS — N1832 Chronic kidney disease, stage 3b: Secondary | ICD-10-CM | POA: Diagnosis not present

## 2022-07-26 DIAGNOSIS — K567 Ileus, unspecified: Principal | ICD-10-CM | POA: Insufficient documentation

## 2022-07-26 LAB — COMPREHENSIVE METABOLIC PANEL
ALT: 49 U/L — ABNORMAL HIGH (ref 0–44)
AST: 41 U/L (ref 15–41)
Albumin: 2.2 g/dL — ABNORMAL LOW (ref 3.5–5.0)
Alkaline Phosphatase: 71 U/L (ref 38–126)
Anion gap: 6 (ref 5–15)
BUN: 30 mg/dL — ABNORMAL HIGH (ref 8–23)
CO2: 23 mmol/L (ref 22–32)
Calcium: 8 mg/dL — ABNORMAL LOW (ref 8.9–10.3)
Chloride: 108 mmol/L (ref 98–111)
Creatinine, Ser: 1.26 mg/dL — ABNORMAL HIGH (ref 0.61–1.24)
GFR, Estimated: >60 mL/min (ref >60)
Glucose, Bld: 123 mg/dL — ABNORMAL HIGH (ref 70–99)
Potassium: 3.3 mmol/L — ABNORMAL LOW (ref 3.5–5.1)
Sodium: 137 mmol/L (ref 135–145)
Total Bilirubin: 0.2 mg/dL — ABNORMAL LOW (ref 0.3–1.2)
Total Protein: 6.9 g/dL (ref 6.5–8.1)

## 2022-07-26 LAB — TRIGLYCERIDES: Triglycerides: 89 mg/dL (ref <150)

## 2022-07-26 LAB — GLUCOSE, CAPILLARY
Glucose-Capillary: 141 mg/dL — ABNORMAL HIGH (ref 70–99)
Glucose-Capillary: 168 mg/dL — ABNORMAL HIGH (ref 70–99)
Glucose-Capillary: 146 mg/dL — ABNORMAL HIGH (ref 70–99)
Glucose-Capillary: 137 mg/dL — ABNORMAL HIGH (ref 70–99)

## 2022-07-26 LAB — MAGNESIUM: Magnesium: 1.6 mg/dL — ABNORMAL LOW (ref 1.7–2.4)

## 2022-07-26 LAB — PHOSPHORUS: Phosphorus: 3.1 mg/dL (ref 2.5–4.6)

## 2022-07-26 MED ORDER — POLYETHYLENE GLYCOL 3350 17 G PO PACK
17.0000 g | PACK | Freq: Every day | ORAL | Status: DC
  Administered 2022-07-26 – 2022-07-27 (×2): 17 g via ORAL
  Filled 2022-07-26 (×2): qty 1

## 2022-07-26 MED ORDER — MAGNESIUM SULFATE 4 GM/100ML IV SOLN
4.0000 g | Freq: Once | INTRAVENOUS | Status: AC
  Administered 2022-07-26: 4 g via INTRAVENOUS
  Filled 2022-07-26: qty 100

## 2022-07-26 MED ORDER — SIMETHICONE 80 MG PO CHEW: 80.0000 mg | CHEWABLE_TABLET | Freq: Four times a day (QID) | ORAL | Status: DC | PRN

## 2022-07-26 MED ORDER — INSULIN ASPART 100 UNIT/ML IJ SOLN
0.0000 [IU] | Freq: Three times a day (TID) | INTRAMUSCULAR | Status: DC
  Administered 2022-07-26 (×2): 2 [IU] via SUBCUTANEOUS
  Administered 2022-07-26: 3 [IU] via SUBCUTANEOUS
  Administered 2022-07-27: 2 [IU] via SUBCUTANEOUS

## 2022-07-26 MED ORDER — LORAZEPAM 0.5 MG PO TABS: 0.5000 mg | ORAL_TABLET | Freq: Two times a day (BID) | ORAL | Status: DC | PRN

## 2022-07-26 MED ORDER — ADULT MULTIVITAMIN W/MINERALS CH
1.0000 | ORAL_TABLET | Freq: Every day | ORAL | Status: DC
  Administered 2022-07-26 – 2022-07-27 (×2): 1 via ORAL
  Filled 2022-07-26 (×2): qty 1

## 2022-07-26 MED ORDER — TRAVASOL 10 % IV SOLN
INTRAVENOUS | Status: AC
  Filled 2022-07-26: qty 537.6

## 2022-07-26 MED ORDER — POTASSIUM CHLORIDE CRYS ER 20 MEQ PO TBCR: 20.0000 meq | EXTENDED_RELEASE_TABLET | Freq: Once | ORAL | Status: DC

## 2022-07-26 MED ORDER — POTASSIUM CHLORIDE CRYS ER 20 MEQ PO TBCR
40.0000 meq | EXTENDED_RELEASE_TABLET | Freq: Every day | ORAL | Status: DC
  Administered 2022-07-26: 40 meq via ORAL
  Filled 2022-07-26: qty 2

## 2022-07-26 MED ORDER — LEVETIRACETAM 500 MG PO TABS
500.0000 mg | ORAL_TABLET | Freq: Two times a day (BID) | ORAL | Status: DC
  Administered 2022-07-26 – 2022-07-27 (×2): 500 mg via ORAL
  Filled 2022-07-26 (×2): qty 1

## 2022-07-26 MED ORDER — ATORVASTATIN CALCIUM 20 MG PO TABS
20.0000 mg | ORAL_TABLET | Freq: Every day | ORAL | Status: DC
  Administered 2022-07-26 – 2022-07-27 (×2): 20 mg via ORAL
  Filled 2022-07-26 (×2): qty 1

## 2022-07-26 MED ORDER — POTASSIUM CHLORIDE CRYS ER 20 MEQ PO TBCR: 40.0000 meq | EXTENDED_RELEASE_TABLET | Freq: Once | ORAL | Status: DC

## 2022-07-26 NOTE — TOC Progression Note (Addendum)
Transition of Care [facility]) - Progression Note   Patient Details  Name: Thomas [name] MRN: [id number] Date of Birth: [birth year]  [facility] CM/SW [place], LCSW Phone Number: 07/26/2022, 10:03 AM  Clinical Narrative: Per chart review, patient may be medically ready for discharge to SNF tomorrow. CSW confirmed with Crystal in admissions at [place] that patient's bed is being held until he is medically ready.  Addendum: CSW attempted to complete insurance authorization on the [employer] portal, but patient is no longer managed by [employer]. CSW notified [facility] as facility will need to start insurance authorization.  Expected Discharge Plan: Skilled Nursing Facility Barriers to Discharge: Continued Medical Work up, SNF Pending bed offer, Ship broker  Expected Discharge Plan and Services In-house Referral: NA Discharge Planning Services: CM Consult Post Acute Care Choice: [place] Living arrangements for the past 2 months: Single Family Home             DME Arranged: N/A DME Agency: NA HH Arranged: PT, Nurse's Aide  Social Determinants of Health (SDOH) Interventions SDOH Screenings   Food Insecurity: No Food Insecurity (07/01/2022)  Housing: Low Risk  (07/01/2022)  Transportation Needs: No Transportation Needs (07/01/2022)  Utilities: Not At Risk (07/01/2022)  Depression (PHQ2-9): Low Risk  (04/28/2021)  Tobacco Use: Medium Risk (07/26/2022)   Readmission Risk Interventions    07/07/2022    5:56 PM 02/16/2021   10:08 AM  Readmission Risk Prevention Plan  Transportation Screening Complete Complete  PCP or Specialist Appt within 3-5 Days Complete   HRI or [place] Complete   Social Work Consult for [place] Planning/Counseling [place] Not Applicable   Medication Review Press photographer) Complete Complete  PCP or Specialist appointment within 3-5 days of discharge  Complete  HRI  or [place]  Complete  SW Recovery Care/Counseling Consult  Complete  Palliative Care Screening  Complete  Skilled Nursing Facility  Complete

## 2022-07-26 NOTE — Progress Notes (Addendum)
DUTCH [place]IECH [id number] June 26, 1955  CARE TEAM:  PCP: [name], NP  Outpatient Care Team: Patient Care Team: [name], NP as PCP - General Benito Mccreedy, MD (Internal Medicine) Patient, No Pcp Per (General Practice) Michael Boston, MD as Consulting Physician (Colon and Rectal Surgery) Irene Shipper, MD as Consulting Physician (Gastroenterology) Ladell Pier, MD as Consulting Physician (Oncology)  Inpatient Treatment Team: Treatment Team: Attending Provider: Michael Boston, MD; Consulting Physician: Alma Friendly, MD; Rounding Team: Fatima Blank, MD; Student Nurse: Sydnee Levans, Alfonso Ramus; Licensed Practical Nurse: Wright, [place] E, LPN; Technician: Tollie Eth, NT; Pharmacist: Eudelia Bunch, [facility]; Utilization Review: Antionette Char, RN; Physical Therapist: Neil Crouch, PT   Problem List:   Principal Problem:   Colon cancer [facility]) Active Problems:   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   History of ischemic left MCA stroke 2015   Type 2 diabetes mellitus with diabetic nephropathy, without long-term current use of insulin ([place])   Hypertension   Protein-calorie malnutrition, moderate ([place])   Epilepsy ([place])   Hyperlipidemia   Delirium   Hypokalemia   Incisional hernia, without obstruction or gangrene   CKD (chronic kidney disease) stage 3b   Hypophosphatemia   Normocytic anemia   Atelectasis   Hyperglycemia   25 Days Post-Op  07/01/2022  POST-OPERATIVE DIAGNOSIS:   COLOSTOMY FOR COLON RESECTION INCARCERATED INCISIONAL HERNIA   PROCEDURE:   TAKEDOWN OF END COLOSTOMY WITH ANASTOMOSIS INTRAOPERATIVE ASSESSMENT OF TISSUE VASCULAR PERFUSION USING ICG (indocyanine green) IMMUNOFLUORESCENCE TRANSVERSUS ABDOMINIS PLANE (TAP) BLOCK - BILATERAL LYSIS OF ADHESIONS x90 MINUTES (50% OF CASE) PRIMARY REPAIR OF INCARCERATED INCISIONAL HERNIA   SURGEON:  Adin Hector, MD  OR FINDINGS:     Patient had very dense intra-abdominal adhesions of small bowel to all quadrants of the abdomen as well as colon.  He had a loop of small bowel incarcerated in a periumbilical midline incisional hernia 4 x 4 cm.  Reduced and primarily repaired.   No obvious metastatic disease on visceral parietal peritoneum or liver.   It is an isoperistaltic ileocolonic anastomosis (proximal transverse colon to distal transverse colon) that rests in the epigastric region.    Assessment  Ileus resolving  [facility] Stay = 25 days)  Plan:  Ileus seems to be more consistent and resolved and that there is no more retching and tolerating solid diet with bowel movements.    With cleanout, back off on multimodal aggressive bowel regimen.  Stop erythromycin.  Continue MiraLAX and Senokot.  Back MiraLAX to 1 dose a day with as needed breakthrough.  Do not think stopping everything will work as it will backfire as before. Switched pain medicines around.  No nausea with hydrocodone.  Making Tylenol as needed.  Back to baseline gabapentin.  I discussed with gastroenterology who has agreed with these interventions.  Also encourages more turning rotation and even prone positioning to help with correcting ileus and distention.  Try to encourage patient to walk more as well. Wean off TPN.    Moderate malnutrition.  Prealbumin a little better but still low. With better PO, wean off TPN  Some acute blood loss anemia in setting of chronic anemia of chronic disease and iron deficiency anemia.  Hgb stable low.  Iron deficient.  I would hold off on giving oral iron as it is rather constipating and could backfire.  He has received IV iron x 2 and IV TPN.  Hopefully that will be sufficient for now  Follow electrolytes closely. He has had issues with hypokalemia & hypophosphatemia.  Recent hypomagnesemia.  Replacing IV and switching to oral.   ICU delirium resolved.  Question of some sundowning at night but is alert this  morning.  Appreciate medical help.    Chronic kidney disease but nonoliguric.  Try and keep on the dry side as possible.  Pulmonary status seems to be stable.   Nebulizers as needed.  Medicine following.  VTE prophylaxis- SCDs, etc  Mobilize as tolerated to help recovery.  Rather deconditioned but was able to mobilize in a with nursing and therapy.  Starting to walk in the hallways more regularly.  Guardedly hopeful sign.  I agree he would benefit from rehab at a skilled facility with more aggressive therapies.  Plans to go to skilled nursing facility.  Hopefully if he has adequate oral intake and is doing better could leave may be as soon as tomorrow.  We will see.     Disposition: Hopefully can wean off TPN and transfer in the next day or so to skilled nursing facility with rehab. The patient is from: Home  Anticipate discharge to:  [place] (SNF)  Anticipated Date of Discharge is:  July 27, 2022   Barriers to discharge:  Pending Clinical improvement (more likely than not)  Patient currently is NOT MEDICALLY STABLE for discharge from the hospital from a surgery standpoint.   I updated the patient's status to the patient and nurse at bedside & ICU RNs just outside room.  Recommendations were made.  Questions were answered.  They expressed understanding & appreciation.    I reviewed nursing notes, last 24 h vitals and pain scores, last 48 h intake and output, last 24 h labs and trends, and last 24 h imaging results.  Reviewed notes with respiratory physical therapies as well as critical care nursing.  I have reviewed this patient's available data, including medical history, events of note, test results, etc as part of my evaluation.  A significant portion of that time was spent in counseling.  Care during the described time interval was provided by me.  This care required moderate level of medical decision making.  07/26/2022    Subjective: (Chief complaint)  Large  volume of loose bowel once yesterday.  Feels much better.  Nursing in room.  Tolerating solid diet.  Denies much nausea or retching anymore.  This the best he is felt this hospitalization  Objective:  Vital signs:  Vitals:   07/25/22 0537 07/25/22 1404 07/25/22 2018 07/26/22 0446  BP: 138/60 134/75 (!) 146/80 (!) 141/70  Pulse: 89 94 84 80  Resp: 18 16 18 18   Temp: 98.5 F (36.9 C) 98.9 F (37.2 C) 99 F (37.2 C) 97.9 F (36.6 C)  TempSrc: Oral Oral Oral Oral  SpO2: 100% 98% 100% 100%  Weight:      Height:        Last BM Date : 07/25/22  Intake/Output   Yesterday:  03/17 0701 - 03/18 0700 In: 2983.4 [P.O.:630; I.V.:1883.2; IV Piggyback:470.2] Out: 2700 [Urine:2700] This shift:  No intake/output data recorded.  Bowel function:  Flatus: YES  BM:  YES  Drain: (No drain)   Physical Exam:  General: Pt awake and alert.  Mildly.  Conversational.  Relaxed.  no acute distress.  Not toxic or sickly.    Eyes: PERRL, normal EOM.  Sclera clear.  No icterus Neuro: CN II-XII intact w/o focal sensory/motor deficits. Lymph: No head/neck/groin lymphadenopathy Psych:  No delerium/psychosis/paranoia.  Oriented x 4.  No confusion or anxiety today. HENT: Normocephalic, Mucus membranes moist.  No thrush Neck: Supple, No tracheal deviation.  No obvious thyromegaly Chest: No pain to chest wall compression.  Good respiratory excursion.  No audible wheezing CV:  Pulses intact.  Regular rhythm.  No major extremity edema MS: Normal AROM mjr joints.  No obvious deformity  Abdomen: Obese but  Soft.  Nondistended.  Nontender .  Right upper quadrant old colostomy wound closed and dry  Normal healing ridge.  No purulence or cellulitis.  No guarding nor evidence of peritonitis.  No incarcerated hernias.  Ext:   No deformity.  No mjr edema.  No cyanosis Skin: No petechiae / purpurea.  No major sores.  Warm and dry    Results:   Cultures: Recent Results (from the past 720 hour(s))   MRSA Next Gen by PCR, Nasal     Status: None   Collection Time: 07/03/22  3:16 PM   Specimen: Nasal Mucosa; Nasal Swab  Result Value Ref Range Status   MRSA by PCR Next Gen NOT DETECTED NOT DETECTED Final    Comment: (NOTE) The GeneXpert MRSA Assay (FDA approved for NASAL specimens only), is one component of a comprehensive MRSA colonization surveillance program. It is not intended to diagnose MRSA infection nor to guide or monitor treatment for MRSA infections. Test performance is not FDA approved in patients less than 81 years old. Performed at [facility], [place] [address], [place], [place] [address]   Culture, blood (Routine X 2) w Reflex to ID Panel     Status: None   Collection Time: 07/11/22  9:57 AM   Specimen: BLOOD  Result Value Ref Range Status   Specimen Description   Final    BLOOD BLOOD LEFT ARM Performed at [place] [address] [place], [place] [address]    Special Requests   Final    BOTTLES DRAWN AEROBIC AND ANAEROBIC Blood Culture adequate volume Performed at [place] [address], [place], [place] [address]    Culture   Final    NO GROWTH 5 DAYS Performed at [facility], [place] [address] [place], [place] [address]    Report Status 07/16/2022 FINAL  Final  Culture, blood (Routine X 2) w Reflex to ID Panel     Status: None   Collection Time: 07/11/22  9:57 AM   Specimen: BLOOD  Result Value Ref Range Status   Specimen Description   Final    BLOOD BLOOD LEFT HAND Performed at [place] [address] [place], [place] [address]    Special Requests   Final    BOTTLES DRAWN AEROBIC AND ANAEROBIC Blood Culture adequate volume Performed at [place] [address], [place], [place] [address]    Culture   Final    NO GROWTH 5 DAYS Performed at [facility], [place] [address], [place], [place] [address]    Report Status 07/16/2022  FINAL  Final  C Difficile Quick Screen (NO PCR Reflex)     Status: None   Collection Time: 07/12/22  8:24 AM   Specimen: STOOL  Result Value Ref Range Status   C Diff antigen NEGATIVE NEGATIVE Final   C Diff toxin NEGATIVE NEGATIVE Final   C Diff interpretation No C. difficile detected.  Final    Comment: Performed at [facility], [place] [address], [place],  [address]  Gastrointestinal  Panel by PCR , Stool     Status: None   Collection Time: 07/12/22  8:24 AM   Specimen: STOOL  Result Value Ref Range Status   Campylobacter species NOT DETECTED NOT DETECTED Final   Plesimonas shigelloides NOT DETECTED NOT DETECTED Final   Salmonella species NOT DETECTED NOT DETECTED Final   Yersinia enterocolitica NOT DETECTED NOT DETECTED Final   Vibrio species NOT DETECTED NOT DETECTED Final   Vibrio cholerae NOT DETECTED NOT DETECTED Final   Enteroaggregative E coli (EAEC) NOT DETECTED NOT DETECTED Final   Enteropathogenic E coli (EPEC) NOT DETECTED NOT DETECTED Final   Enterotoxigenic E coli (ETEC) NOT DETECTED NOT DETECTED Final   Shiga like toxin producing E coli (STEC) NOT DETECTED NOT DETECTED Final   Shigella/Enteroinvasive E coli (EIEC) NOT DETECTED NOT DETECTED Final   Cryptosporidium NOT DETECTED NOT DETECTED Final   Cyclospora cayetanensis NOT DETECTED NOT DETECTED Final   Entamoeba histolytica NOT DETECTED NOT DETECTED Final   Giardia lamblia NOT DETECTED NOT DETECTED Final   Adenovirus F40/41 NOT DETECTED NOT DETECTED Final   Astrovirus NOT DETECTED NOT DETECTED Final   Norovirus GI/GII NOT DETECTED NOT DETECTED Final   Rotavirus A NOT DETECTED NOT DETECTED Final   Sapovirus (I, II, IV, and V) NOT DETECTED NOT DETECTED Final    Comment: Performed at [facility], [place] [place], [place] [address]    Labs: Results for orders placed or performed during the hospital encounter of 07/01/22 (from the past 48 hour(s))  Glucose, capillary      Status: Abnormal   Collection Time: 07/24/22 11:31 AM  Result Value Ref Range   Glucose-Capillary 152 (H) 70 - 99 mg/dL    Comment: Glucose reference range applies only to samples taken after fasting for at least 8 hours.  Glucose, capillary     Status: Abnormal   Collection Time: 07/24/22  5:57 PM  Result Value Ref Range   Glucose-Capillary 152 (H) 70 - 99 mg/dL    Comment: Glucose reference range applies only to samples taken after fasting for at least 8 hours.  Glucose, capillary     Status: Abnormal   Collection Time: 07/24/22 11:41 PM  Result Value Ref Range   Glucose-Capillary 131 (H) 70 - 99 mg/dL    Comment: Glucose reference range applies only to samples taken after fasting for at least 8 hours.  Basic metabolic panel     Status: Abnormal   Collection Time: 07/25/22  2:54 AM  Result Value Ref Range   Sodium 138 135 - 145 mmol/L   Potassium 3.4 (L) 3.5 - 5.1 mmol/L   Chloride 109 98 - 111 mmol/L   CO2 23 22 - 32 mmol/L   Glucose, Bld 146 (H) 70 - 99 mg/dL    Comment: Glucose reference range applies only to samples taken after fasting for at least 8 hours.   BUN 32 (H) 8 - 23 mg/dL   Creatinine, Ser 1.34 (H) 0.61 - 1.24 mg/dL   Calcium 8.0 (L) 8.9 - 10.3 mg/dL   GFR, Estimated 59 (L) >60 mL/min    Comment: (NOTE) Calculated using the CKD-EPI Creatinine Equation (2021)    Anion gap 6 5 - 15    Comment: Performed at [facility], [place] [address], [place], [place] [address]  CBC     Status: Abnormal   Collection Time: 07/25/22  2:54 AM  Result Value Ref Range   WBC 12.1 (H) 4.0 - 10.5 K/uL  RBC 3.01 (L) 4.22 - 5.81 MIL/uL   Hemoglobin 7.6 (L) 13.0 - 17.0 g/dL    Comment: Reticulocyte Hemoglobin testing may be clinically indicated, consider ordering this additional test [id number]    HCT 22.9 (L) 39.0 - 52.0 %   MCV 76.1 (L) 80.0 - 100.0 fL   MCH 25.2 (L) 26.0 - 34.0 pg   MCHC 33.2 30.0 - 36.0 g/dL   RDW 15.1 11.5 - 15.5 %   Platelets 465 (H) 150 -  400 K/uL   nRBC 0.0 0.0 - 0.2 %    Comment: Performed at [facility], [place] [address], [place], [place] [address]  Glucose, capillary     Status: Abnormal   Collection Time: 07/25/22  5:43 AM  Result Value Ref Range   Glucose-Capillary 184 (H) 70 - 99 mg/dL    Comment: Glucose reference range applies only to samples taken after fasting for at least 8 hours.  Glucose, capillary     Status: Abnormal   Collection Time: 07/25/22 11:54 AM  Result Value Ref Range   Glucose-Capillary 157 (H) 70 - 99 mg/dL    Comment: Glucose reference range applies only to samples taken after fasting for at least 8 hours.  Glucose, capillary     Status: Abnormal   Collection Time: 07/25/22  5:56 PM  Result Value Ref Range   Glucose-Capillary 127 (H) 70 - 99 mg/dL    Comment: Glucose reference range applies only to samples taken after fasting for at least 8 hours.  Glucose, capillary     Status: Abnormal   Collection Time: 07/25/22 11:45 PM  Result Value Ref Range   Glucose-Capillary 137 (H) 70 - 99 mg/dL    Comment: Glucose reference range applies only to samples taken after fasting for at least 8 hours.  Comprehensive metabolic panel     Status: Abnormal   Collection Time: 07/26/22  2:50 AM  Result Value Ref Range   Sodium 137 135 - 145 mmol/L   Potassium 3.3 (L) 3.5 - 5.1 mmol/L   Chloride 108 98 - 111 mmol/L   CO2 23 22 - 32 mmol/L   Glucose, Bld 123 (H) 70 - 99 mg/dL    Comment: Glucose reference range applies only to samples taken after fasting for at least 8 hours.   BUN 30 (H) 8 - 23 mg/dL   Creatinine, Ser 1.26 (H) 0.61 - 1.24 mg/dL   Calcium 8.0 (L) 8.9 - 10.3 mg/dL   Total Protein 6.9 6.5 - 8.1 g/dL   Albumin 2.2 (L) 3.5 - 5.0 g/dL   AST 41 15 - 41 U/L   ALT 49 (H) 0 - 44 U/L   Alkaline Phosphatase 71 38 - 126 U/L   Total Bilirubin 0.2 (L) 0.3 - 1.2 mg/dL   GFR, Estimated >60 >60 mL/min    Comment: (NOTE) Calculated using the CKD-EPI Creatinine Equation (2021)     Anion gap 6 5 - 15    Comment: Performed at [facility], [place] [address], [place], [place] [address]  Magnesium     Status: Abnormal   Collection Time: 07/26/22  2:50 AM  Result Value Ref Range   Magnesium 1.6 (L) 1.7 - 2.4 mg/dL    Comment: Performed at [facility], [place] [address], [place], [place] [address]  Phosphorus     Status: None   Collection Time: 07/26/22  2:50 AM  Result Value Ref Range   Phosphorus 3.1 2.5 - 4.6 mg/dL  Comment: Performed at [facility], [place] [address] [place], [place] [address]  Triglycerides     Status: None   Collection Time: 07/26/22  2:50 AM  Result Value Ref Range   Triglycerides 89 <150 mg/dL    Comment: Performed at [facility], [place] [address] [place], [place] [address]  Glucose, capillary     Status: Abnormal   Collection Time: 07/26/22  6:07 AM  Result Value Ref Range   Glucose-Capillary 141 (H) 70 - 99 mg/dL    Comment: Glucose reference range applies only to samples taken after fasting for at least 8 hours.    Imaging / Studies: No results found.  Medications / Allergies: per chart  Antibiotics: Anti-infectives (From admission, onward)    Start     Dose/Rate Route Frequency Ordered Stop   07/22/22 2000  erythromycin 250 mg in sodium chloride 0.9 % 100 mL IVPB        250 mg 100 mL/hr over 60 Minutes Intravenous Every 8 hours 07/22/22 1903     07/22/22 0800  erythromycin 250 mg in sodium chloride 0.9 % 100 mL IVPB  Status:  Discontinued        250 mg 100 mL/hr over 60 Minutes Intravenous Every 8 hours 07/22/22 0713 07/22/22 0728   07/10/22 0400  piperacillin-tazobactam (ZOSYN) IVPB 3.375 g  Status:  Discontinued        3.375 g 12.5 mL/hr over 240 Minutes Intravenous Every 8 hours 07/09/22 2040 07/10/22 0738   07/08/22 1100  piperacillin-tazobactam (ZOSYN) IVPB 3.375 g  Status:  Discontinued        3.375 g 12.5 mL/hr over 240 Minutes Intravenous Every 8  hours 07/08/22 1001 07/09/22 2040   07/08/22 1000  erythromycin 250 mg in sodium chloride 0.9 % 100 mL IVPB  Status:  Discontinued        250 mg 100 mL/hr over 60 Minutes Intravenous Every 12 hours 07/08/22 0739 07/09/22 0730   07/06/22 0915  erythromycin 250 mg in sodium chloride 0.9 % 100 mL IVPB  Status:  Discontinued        250 mg 100 mL/hr over 60 Minutes Intravenous Every 8 hours 07/06/22 0823 07/08/22 0739   07/03/22 1700  piperacillin-tazobactam (ZOSYN) IVPB 3.375 g        3.375 g 12.5 mL/hr over 240 Minutes Intravenous Every 8 hours 07/03/22 1612 07/08/22 0450   07/02/22 0000  cefoTEtan (CEFOTAN) 2 g in sodium chloride 0.9 % 100 mL IVPB        2 g 200 mL/hr over 30 Minutes Intravenous Every 12 hours 07/01/22 1737 07/02/22 0051   07/01/22 1400  neomycin (MYCIFRADIN) tablet 1,000 mg  Status:  Discontinued       See Hyperspace for full Linked Orders Report.   1,000 mg Oral 3 times per day 07/01/22 1054 07/01/22 1109   07/01/22 1400  metroNIDAZOLE (FLAGYL) tablet 1,000 mg  Status:  Discontinued       See Hyperspace for full Linked Orders Report.   1,000 mg Oral 3 times per day 07/01/22 1054 07/01/22 1109   07/01/22 1100  cefoTEtan (CEFOTAN) 2 g in sodium chloride 0.9 % 100 mL IVPB        2 g 200 mL/hr over 30 Minutes Intravenous On call to O.R. 07/01/22 1054 07/01/22 1204         Note: Portions of this report may have been transcribed using voice recognition software. Every effort was made to ensure accuracy; however, inadvertent  computerized transcription errors may be present.   Any transcriptional errors that result from this process are unintentional.    Adin Hector, MD, FACS, MASCRS Esophageal, Gastrointestinal & Colorectal Surgery Robotic and Minimally Invasive Surgery  [facility]. [address], [place], [place] [address] [phone] Fax [phone] Main  CONTACT INFORMATION:  Weekday  (9AM-5PM): Call [facility] main office at [phone]  Weeknight (5PM-9AM) or Weekend/Holiday: Check [email or web address] (password " [email or web address]") for General Surgery [facility] coverage  (Please, do not use SecureChat as it is not reliable communication to reach operating surgeons for immediate patient care given surgeries/outpatient duties/clinic/cross-coverage/off post-call which would lead to a delay in care.  Epic staff messaging available for outptient concerns, but may not be answered for 48 hours or more).     07/26/2022  8:01 AM

## 2022-07-26 NOTE — Progress Notes (Signed)
Nutrition Follow-up  DOCUMENTATION CODES:   Non-severe (moderate) malnutrition in context of chronic illness  INTERVENTION:   -Boost Breeze po TID, each supplement provides 250 kcal and 9 grams of protein   -TPN management per Pharmacy -weaning today  NUTRITION DIAGNOSIS:   Moderate Malnutrition related to chronic illness (transverse colon mass) as evidenced by moderate fat depletion, moderate muscle depletion.  Ongoing.  GOAL:   Patient will meet greater than or equal to 90% of their needs  Progressing.  MONITOR:   Diet advancement, Labs, Weight trends, PO intake   ASSESSMENT:   67 year old male with history of transverse colon mass s/p transverse colectomy, DM2, who was admitted for an elective robotic ostomy takedown with lysis of adhesions on 07/01/22. Postoperatively noted to have right lower lobe collapse and hypoxemia as well as ileus.  2/22 takedown of end colostomy and anastomosis, lysis of adhesions, and repair of incarcerated incisional hernia 2/23 advanced to DYS 1 diet 2/24 pt vomited, NG placed->LIS 2/28 CL diet 2/29 DYS 1 diet, starting TPN 3/3 Increased to goal TPN 3/5 DYS 3 diet 3/6 Starting calorie count 3/7 TPN reduced to 1/2 rate 3/15 TPN increased back to goal rate  Patient now on regular diet. Consumed 50% of breakfast this morning. Drinking Boost Breeze supplements. TPN to be decreased and weaned off today. Per TOC note, pt may discharge to SNF tomorrow.  Admission weight: 192 lbs Current weight: 203 lbs  Medications: Dulcolax, Reglan, Multivitamin with minerals daily, Miralax, KLOR-CON, IV Mg sulfate  Labs reviewed: CBGs: 127-184 Low K Low Mg  Diet Order:   Diet Order             Diet regular Room service appropriate? Yes; Fluid consistency: Thin  Diet effective now           Diet - low sodium heart healthy                   EDUCATION NEEDS:   Education needs have been addressed  Skin:  Skin Assessment: Reviewed RN  Assessment Skin Integrity Issues:: Incisions Incisions: R Abdomen  Last BM:  3/18 -type 6  Height:   Ht Readings from Last 1 Encounters:  07/01/22 5\' 8"  (1.727 m)    Weight:   Wt Readings from Last 1 Encounters:  07/23/22 92.3 kg    BMI:  Body mass index is 30.94 kg/m.  Estimated Nutritional Needs:   Kcal:  2400-2600 kcals  Protein:  125-140 grams  Fluid:  >/= 2.4L  Clayton Bibles, MS, RD, LDN Inpatient Clinical Dietitian Contact information available via Amion

## 2022-07-26 NOTE — Progress Notes (Signed)
PHARMACY - TOTAL PARENTERAL NUTRITION CONSULT NOTE   Indication: Prolonged ileus  Patient Measurements: Height: 5\' 8"  (172.7 cm) Weight: 92.3 kg (203 lb 7.8 oz) IBW/kg (Calculated) : 68.4 TPN AdjBW (KG): 73.1 Body mass index is 30.94 kg/m.  Assessment: 67 yo male with hx colon cancer diagnosed 2022 s/p transverse colectomy admitted for elective robotic ostomy takedown with lysis of adhesions, incarcerated incisional hernia repair on 07/01/22. Post-op course complicated by right lower lobe collapse and hypoxemia. Pharmacy consulted for TPN on 2/29 d/t prolonged ileus.  Recent Labs    07/25/22 0254 07/26/22 0250  NA 138 137  K 3.4* 3.3*  CL 109 108  CO2 23 23  GLUCOSE 146* 123*  BUN 32* 30*  CREATININE 1.34* 1.26*  CALCIUM 8.0* 8.0*  PHOS  --  3.1  MG  --  1.6*  ALBUMIN  --  2.2*  ALKPHOS  --  71  AST  --  41  ALT  --  49*  BILITOT  --  0.2*  TRIG  --  89      Glucose / Insulin: Hx DM2 but A1c well controlled at 5.1 as of 2/22. SSI resumed 3/15  - CBG: most at goal < 180 - 9 units SSI required in past 24hr. Electrolytes: K 3.3, Mag 1.6 Na at max concentration in TPN. Renal: CKD3 (baseline SCr ~1.5) - SCr stable and close to baseline; BUN elevated but stable - UOP remains adequate Hepatic: OK Intake / Output:. Erythromycin IV for gut motility and aggressive bowel regimen.  -UOP: 2700 mL  -13  BMs, daily dulcolax suppos (refused x 2 days), miralax (refused x 2 days) , senokot, (refused)  emycin, reglan -MIVF: NS @ KVO GI Imaging: - 2/27 AXR: improving postop pneumoperitoneum - 2/29 AXR: possible ileus  - 3/1 CT a/p: difficult to r/o anastomotic leak d/t contrast not reaching anastomosis, but no signs of abscess - 3/13: CT Abdomen Pelvis: Findings most consistent with post-op ileus - dilated bowel. No leak, abscess, perforation, or ischemia. GI Surgeries / Procedures:  - 2/22: robotic colostomy takedown and lysis of adhesions  Central access: implanted port. Double  lumen PICC placed 3/2 TPN start date: 2/29  Nutritional Goals: Goal TPN rate is 100 mL/hr (provides 134 g of protein, 336 g dextrose, and 2,400 kcals per day)  RD Assessment: pending Estimated Needs Total Energy Estimated Needs: 2400-2600 kcals Total Protein Estimated Needs: 125-140 grams Total Fluid Estimated Needs: >/= 2.4L  Current Nutrition: Plan to discharge to SNF once tolerating PO intake and meeting >50% of needs -Regular diet - po intake poor per RN on 3/18 -Boost TID - drank 2 yesterday -TPN rate 100> 75 ml/hr on 3/17 per Dr. Brantley Stage 3/18 wean TPN to off  Plan:  Mag 4 gm IV x 1 dose now; Kdur 40 po qday x 3 days per MD Cut TPN rate to 40 ml/hr now and then DC TPN at 1800 today per Dr Johney Maine Change MVI to PO Continue moderate SSI and change CBG checks to ACHS Change IV keppra to PO, per RN is tolerating PO  meds well DC TPN labs Pharmacy to sign off  Eudelia Bunch, Pharm.D Use secure chat for questions 07/26/2022 7:47 AM

## 2022-07-26 NOTE — Progress Notes (Signed)
  Progress Note   Patient: Thomas [name] [id number] DOB: March 19, 1956 DOA: 07/01/2022     25 DOS: the patient was seen and examined on 07/26/2022 at 9:43AM      Brief hospital course: Mr. Muneton is a 67 y.o. M with DM, HTN, colon CA, hx stroke, seizures, meningioma who presented for electrive ostomy takedown.  Post-operative course complicated by delirium and hypoxia from atelectasis/mucus plugging/aspiration, medicine consulted.        Assessment and Plan: Colon cancer ([place]) 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 Incisional hernia, without obstruction or gangrene - Defer to General Surgery      Active issues: Bilateral pleural effusion Small.  Seen incidentally on CT 3/13.  Asymptomatic.  Likely due to hypoalbuminemia, will resolve with improving nutrition. - Recommend CXR 2V with PCP in 3-4 weeks  Iron deficiency anemia S/P IV iron.  Agree with surgery, avoid oral iron. - Repeat iron studies in 3-4 weeks  Hyperlipidemia History of ischemic left MCA stroke 2015 - Resume Crestor in 3-4 weeks  Epilepsy ([place]) No seizures here - Continue Keppra  Hypertension BP controlled - Continue amlodipine   Type 2 diabetes mellitus with diabetic nephropathy, without long-term current use of insulin (HCC) Glucoses well controlled - Continue SS corrections - No insulin needed at d/c    Inactive issues: Atelectasis Post op had mucous plugging RLL, atelectasis, hypoxia.  Respiratory failure ruled out.    Treated with pulmonary toilet, resolved.   CKD (chronic kidney disease) stage 3b Cr stable relative to baseline  Hypophosphatemia Hypokalemia  Delirium Resolved                 Subjective: Feling better.     Physical Exam: BP (!) 130/58 (BP Location: Left Arm)   Pulse 89   Temp 98.7 F (37.1 C) (Oral)   Resp 16   Ht 5\' 8"  (1.727 m)   Wt 92.3 kg   SpO2 99%   BMI 30.94 kg/m   Adult male, sitting up in  recliner, interactive and appropriate RRR, soft systolic murmur, no peripheral edema Respiratory rate normal, lungs clear without rales or wheezes Abdomen soft without tenderness palpation or guarding Attention normal, affect appropriate, moves upper extremities with normal strength and coordination, speech fluent    Data Reviewed: Basic metabolic panel reviewed, mild hypokalemia CBC from yesterday showed anemia 7.6, no clinical bleeding           Author: Edwin Dada, MD 07/26/2022 2:12 PM  For on call review [email or web address].

## 2022-07-26 NOTE — Progress Notes (Signed)
Physical Therapy Treatment Patient Details Name: Thomas [name] MRN: [id number] DOB: [birth year] Today's Date: 07/26/2022   History of Present Illness Patient is a 67 yo male admitted 07/01/22  S/P LYSIS OF ADHESIONS , PRIMARY REPAIR OF INCARCERATED INCISIONAL HERNIA , TAKEDOWN OF END COLOSTOMY WITH ANASTOMOSIS.2/24 moved to stepdown unit for hypoxemia, right lower lobe collapse CT chest showed debris in right lower lobe with atelectasis. Now with post op ileus, asp pneumonitis. PMH, colon and rectal  cancer, colostomy, VDRF with trach 2022,   DVT, afib, DM, HTN, seizure, CVA, fromtal meningioma, lobectomy    PT Comments    Pt is progressing well with PT, amb more with assist of staff as well. Activity tolerance is increasing and pt remains motivated to work with PT. Plan is for SNF   Recommendations for follow up therapy are one component of a multi-disciplinary discharge planning process, led by the attending physician.  Recommendations may be updated based on patient status, additional functional criteria and insurance authorization.  Follow Up Recommendations  Skilled nursing-short term rehab (<3 hours/day) Can patient physically be transported by private vehicle: Yes   Assistance Recommended at Discharge Intermittent Supervision/Assistance  Patient can return home with the following Assist for transportation;Assistance with cooking/housework;Help with stairs or ramp for entrance;A little help with bathing/dressing/bathroom   Equipment Recommendations  None recommended by PT    Recommendations for Other Services       Precautions / Restrictions Precautions Precautions: Fall Precaution Comments: abdominal, post op ILEUS Restrictions Weight Bearing Restrictions: No     Mobility  Bed Mobility               General bed mobility comments: pt in recliner; reports he is not having difficulty with bed mobility    Transfers Overall transfer level: Needs assistance Equipment  used: Rolling walker (2 wheels) Transfers: Sit to/from Stand Sit to Stand: Supervision           General transfer comment: increased time, cues to power up with LEs; repeated STS emphasizing control of descent    Ambulation/Gait Ambulation/Gait assistance: Supervision Gait Distance (Feet):  (hallway ambulation) Assistive device: Rolling walker (2 wheels) Gait Pattern/deviations: Step-through pattern, Decreased stride length Gait velocity: decr     General Gait Details: gait stability improving, no rest breaks this session; cues for trunk extension   Stairs             Wheelchair Mobility    Modified Rankin (Stroke Patients Only)       Balance   Sitting-balance support: No upper extremity supported, Feet supported Sitting balance-Leahy Scale: Good     Standing balance support: During functional activity, Reliant on assistive device for balance Standing balance-Leahy Scale: Fair (at least [place], [place] to [employer])                              Cognition Arousal/Alertness: Awake/alert Behavior During Therapy: WFL for tasks assessed/performed Overall Cognitive Status: Within Functional Limits for tasks assessed                                 General Comments: plesant and cooperative, wife was present during session        Exercises      General Comments        Pertinent Vitals/Pain Pain Assessment Pain Assessment: Faces Faces Pain Scale: Hurts a little bit Pain Location:  ABD Pain Descriptors / Indicators: Discomfort Pain Intervention(s): Limited activity within patient's tolerance, Monitored during session    Home Living                          Prior Function            PT Goals (current goals can now be found in the care plan section) Acute Rehab PT Goals Patient Stated Goal: go home PT Goal Formulation: With patient Time For Goal Achievement: 08/04/22 Potential to Achieve Goals: Good Progress  towards PT goals: Progressing toward goals    Frequency    Min 3X/week      PT Plan Current plan remains appropriate    Co-evaluation              AM-PAC PT "6 Clicks" Mobility   Outcome Measure  Help needed turning from your back to your side while in a flat bed without using bedrails?: A Little Help needed moving from lying on your back to sitting on the side of a flat bed without using bedrails?: A Little Help needed moving to and from a bed to a chair (including a wheelchair)?: A Little Help needed standing up from a chair using your arms (e.g., wheelchair or bedside chair)?: A Little Help needed to walk in hospital room?: A Little Help needed climbing 3-5 steps with a railing? : A Little 6 Click Score: 18    End of Session Equipment Utilized During Treatment: Gait belt Activity Tolerance: Patient tolerated treatment well Patient left: in chair;with family/visitor present;with call bell/phone within reach Nurse Communication: Mobility status PT Visit Diagnosis: Difficulty in walking, not elsewhere classified (R26.2)     Time: [id number] PT Time Calculation (min) (ACUTE ONLY): 15 min  Charges:  $Gait Training: 8-22 mins                     Baxter Flattery, PT  Acute Rehab Dept [facility]) [phone]  [facility] Weekend Pager (Saturday/Sunday only)  [phone]  07/26/2022    [facility] 07/26/2022, 12:57 PM

## 2022-07-26 NOTE — Assessment & Plan Note (Addendum)
Small.  Seen incidentally on CT 3/13.  Asymptomatic.  Likely due to hypoalbuminemia, will resolve with improving nutrition. - Recommend CXR 2V with PCP in 3-4 weeks

## 2022-07-27 ENCOUNTER — Other Ambulatory Visit (HOSPITAL_COMMUNITY): Payer: Self-pay

## 2022-07-27 LAB — GLUCOSE, CAPILLARY: Glucose-Capillary: 118 mg/dL — ABNORMAL HIGH (ref 70–99)

## 2022-07-27 LAB — BASIC METABOLIC PANEL
Anion gap: 9 (ref 5–15)
BUN: 30 mg/dL — ABNORMAL HIGH (ref 8–23)
CO2: 21 mmol/L — ABNORMAL LOW (ref 22–32)
Calcium: 7.9 mg/dL — ABNORMAL LOW (ref 8.9–10.3)
Chloride: 106 mmol/L (ref 98–111)
Creatinine, Ser: 1.27 mg/dL — ABNORMAL HIGH (ref 0.61–1.24)
GFR, Estimated: >60 mL/min (ref >60)
Glucose, Bld: 160 mg/dL — ABNORMAL HIGH (ref 70–99)
Potassium: 2.8 mmol/L — ABNORMAL LOW (ref 3.5–5.1)
Sodium: 136 mmol/L (ref 135–145)

## 2022-07-27 LAB — MAGNESIUM: Magnesium: 2 mg/dL (ref 1.7–2.4)

## 2022-07-27 LAB — PREALBUMIN: Prealbumin: 21 mg/dL (ref 18–38)

## 2022-07-27 MED ORDER — HYDROCODONE-ACETAMINOPHEN 5-325 MG PO TABS
1.0000 | ORAL_TABLET | Freq: Four times a day (QID) | ORAL | 0 refills | Status: DC | PRN
  Filled 2022-07-27: qty 20, 3d supply, fill #0

## 2022-07-27 MED ORDER — POTASSIUM CHLORIDE CRYS ER 20 MEQ PO TBCR
40.0000 meq | EXTENDED_RELEASE_TABLET | Freq: Two times a day (BID) | ORAL | Status: DC
  Administered 2022-07-27: 40 meq via ORAL
  Filled 2022-07-27: qty 2

## 2022-07-27 MED ORDER — SIMETHICONE 80 MG PO CHEW
80.0000 mg | CHEWABLE_TABLET | Freq: Four times a day (QID) | ORAL | 0 refills | Status: DC | PRN
  Filled 2022-07-27: qty 30, 8d supply, fill #0

## 2022-07-27 MED ORDER — POTASSIUM CHLORIDE 10 MEQ/100ML IV SOLN
10.0000 meq | INTRAVENOUS | Status: AC
  Administered 2022-07-27 (×4): 10 meq via INTRAVENOUS
  Filled 2022-07-27: qty 100

## 2022-07-27 MED ORDER — POLYETHYLENE GLYCOL 3350 17 GM/SCOOP PO POWD
17.0000 g | Freq: Every day | ORAL | 0 refills | Status: DC
  Filled 2022-07-27: qty 238, 14d supply, fill #0

## 2022-07-27 MED ORDER — SENNA 8.6 MG PO TABS
1.0000 | ORAL_TABLET | Freq: Every day | ORAL | 0 refills | Status: DC
  Filled 2022-07-27: qty 120, 120d supply, fill #0

## 2022-07-27 MED ORDER — SENNA 8.6 MG PO TABS
1.0000 | ORAL_TABLET | Freq: Every day | ORAL | Status: DC
  Administered 2022-07-27: 8.6 mg via ORAL
  Filled 2022-07-27: qty 1

## 2022-07-27 MED ORDER — POTASSIUM CHLORIDE CRYS ER 20 MEQ PO TBCR
40.0000 meq | EXTENDED_RELEASE_TABLET | Freq: Two times a day (BID) | ORAL | 0 refills | Status: DC
  Filled 2022-07-27: qty 20, 5d supply, fill #0

## 2022-07-27 MED ORDER — POLYETHYLENE GLYCOL 3350 17 G PO PACK
17.0000 g | PACK | Freq: Two times a day (BID) | ORAL | 0 refills | Status: DC | PRN
  Filled 2022-07-27: qty 14, 7d supply, fill #0

## 2022-07-27 MED ORDER — METOCLOPRAMIDE HCL 5 MG PO TABS
5.0000 mg | ORAL_TABLET | Freq: Four times a day (QID) | ORAL | 2 refills | Status: DC | PRN
  Filled 2022-07-27: qty 20, 5d supply, fill #0

## 2022-07-27 NOTE — Progress Notes (Signed)
Mobility Specialist - Progress Note   07/27/22 1501  Mobility  Activity Ambulated with assistance in hallway (Standing Exercises)  Level of Assistance Standby assist, set-up cues, supervision of patient - no hands on  Assistive Device Front wheel walker  Distance Ambulated (ft) 600 ft  Activity Response Tolerated well  Mobility Referral Yes  $Mobility charge 1 Mobility   Pt received in bed and agreeable to mobility & standing exercises. See below for exercises pt was able to participate in. No complaints during session. Upon returning to room pt requested assistance to bathroom for BM. Pt to bed after session with all needs met.    [facility]

## 2022-07-27 NOTE — Progress Notes (Signed)
Mobility Specialist - Progress Note   07/27/22 0921  Mobility  Activity Ambulated with assistance in hallway  Level of Assistance Standby assist, set-up cues, supervision of patient - no hands on  Assistive Device Front wheel walker  Distance Ambulated (ft) 600 ft  Activity Response Tolerated well  Mobility Referral Yes  $Mobility charge 1 Mobility   Pt received in recliner and agreeable to mobility. No complaints during session. Pt to recliner awaiting meal after session with all needs met.    [facility]

## 2022-07-27 NOTE — TOC Transition Note (Signed)
Transition of Care [facility]) - CM/SW Discharge Note  Patient Details  Name: Thomas [name] MRN: [id number] Date of Birth: [birth year]  Transition of Care [facility]) CM/SW Contact:  Sherie Don, LCSW Phone Number: 07/27/2022, 2:48 PM  Clinical Narrative: Patient has insurance approval for SNF at [facility]. Patient will go to room 212 and the number for report is [phone]. Discharge summary, discharge orders, and SNF transfer report faxed to facility in hub. Wife agreeable to providing transportation to SNF. Discharge packet completed. RN updated. TOC signing off.  Final next level of care: Skilled Nursing Facility Barriers to Discharge: Barriers Resolved  Patient Goals and CMS Choice CMS Medicare.gov Compare Post Acute Care list provided to:: Patient Choice offered to / list presented to : Patient  Discharge Placement Existing PASRR number confirmed : 07/14/22          Patient chooses bed at: [facility] Patient to be transferred to facility by: Spouse Name of family member notified: Tallin Ehly (spouse) Patient and family notified of of transfer: 07/27/22  Discharge Plan and Services Additional resources added to the After Visit Summary for   In-house Referral: NA Discharge Planning Services: CM Consult Post Acute Care Choice: [place]          DME Arranged: N/A DME Agency: NA HH Arranged: PT, Nurse's Aide  Social Determinants of Health (SDOH) Interventions SDOH Screenings   Food Insecurity: No Food Insecurity (07/01/2022)  Housing: [place]  (07/01/2022)  Transportation Needs: No Transportation Needs (07/01/2022)  Utilities: Not At Risk (07/01/2022)  Depression (PHQ2-9): Low Risk  (04/28/2021)  Tobacco Use: Medium Risk (07/26/2022)   Readmission Risk Interventions    07/07/2022    5:56 PM 02/16/2021   10:08 AM  Readmission Risk Prevention Plan  Transportation Screening Complete Complete  PCP or Specialist Appt within 3-5 Days Complete   HRI or [place] Complete   Social Work Consult for [place] Planning/Counseling [place] Not Applicable   Medication Review Press photographer) Complete Complete  PCP or Specialist appointment within 3-5 days of discharge  Complete  HRI or [place]  Complete  SW Recovery Care/Counseling Consult  Complete  Palliative Care Screening  Complete  Skilled Nursing Facility  Complete

## 2022-07-27 NOTE — Progress Notes (Signed)
Patient being discharged to [facility] via private auto with wife,attempted to call report to facility, no answer x 3

## 2022-07-27 NOTE — Progress Notes (Signed)
RA DL PICC removed per protocol per MD order. Manual pressure applied for 5 mins. Vaseline gauze, gauze, and Medipore tape applied over insertion site. No bleeding or swelling noted. Instructed patient to remain in bed for thirty mins. Educated patient about S/S of infection and when to call MD; no heavy lifting or pressure on right side for 24 hours; keep dressing dry and intact for 24 hours. Pt verbalized comprehension.

## 2022-07-28 ENCOUNTER — Other Ambulatory Visit (HOSPITAL_COMMUNITY): Payer: Self-pay

## 2022-09-10 ENCOUNTER — Inpatient Hospital Stay: Payer: Medicare PPO | Attending: Oncology

## 2022-09-10 ENCOUNTER — Inpatient Hospital Stay (HOSPITAL_BASED_OUTPATIENT_CLINIC_OR_DEPARTMENT_OTHER): Payer: Medicare PPO | Admitting: Oncology

## 2022-09-10 ENCOUNTER — Inpatient Hospital Stay: Payer: Medicare PPO

## 2022-09-10 VITALS — BP 140/75 | HR 83 | Temp 98.2°F | Resp 18 | Ht 68.0 in | Wt 190.8 lb

## 2022-09-10 DIAGNOSIS — C184 Malignant neoplasm of transverse colon: Secondary | ICD-10-CM | POA: Insufficient documentation

## 2022-09-10 DIAGNOSIS — Z95828 Presence of other vascular implants and grafts: Secondary | ICD-10-CM

## 2022-09-10 LAB — CEA (ACCESS): CEA (CHCC): 1.92 ng/mL (ref 0.00–5.00)

## 2022-09-10 MED ORDER — HEPARIN SOD (PORK) LOCK FLUSH 100 UNIT/ML IV SOLN
500.0000 [IU] | Freq: Once | INTRAVENOUS | Status: AC
  Administered 2022-09-10: 500 [IU] via INTRAVENOUS

## 2022-09-10 MED ORDER — SODIUM CHLORIDE 0.9% FLUSH
10.0000 mL | INTRAVENOUS | Status: DC | PRN
  Administered 2022-09-10: 10 mL via INTRAVENOUS

## 2022-09-10 NOTE — Progress Notes (Signed)
[facility] OFFICE PROGRESS NOTE   Diagnosis: Colon cancer  INTERVAL HISTORY:   Mr. Menna returns as scheduled.  He feels well.  Good appetite.  His bowels are functioning.  He is here today with his wife.  He underwent colostomy takedown and repair of an incisional hernia on 07/01/2022.  He had a prolonged postoperative ileus. He was discharged home 07/16/2022. Objective:  Vital signs in last 24 hours:  Blood pressure (!) 140/75, pulse 83, temperature 98.2 F (36.8 C), temperature source Oral, resp. rate 18, height 5\' 8"  (1.727 m), weight 190 lb 12.8 oz (86.5 kg), SpO2 100 %.    Lymphatics: No cervical, supraclavicular, axillary, or inguinal nodes Resp: Lungs clear bilaterally Cardio: Regular rate and rhythm GI: No hepatosplenomegaly, no mass, nontender Vascular: No leg edema  Portacath/PICC-without erythema  Lab Results:  Lab Results  Component Value Date   WBC 12.1 (H) 07/25/2022   HGB 7.6 (L) 07/25/2022   HCT 22.9 (L) 07/25/2022   MCV 76.1 (L) 07/25/2022   PLT 465 (H) 07/25/2022   NEUTROABS 14.3 (H) 07/15/2022    CMP  Lab Results  Component Value Date   NA 136 07/27/2022   K 2.8 (L) 07/27/2022   CL 106 07/27/2022   CO2 21 (L) 07/27/2022   GLUCOSE 160 (H) 07/27/2022   BUN 30 (H) 07/27/2022   CREATININE 1.27 (H) 07/27/2022   CALCIUM 7.9 (L) 07/27/2022   PROT 6.9 07/26/2022   ALBUMIN 2.2 (L) 07/26/2022   AST 41 07/26/2022   ALT 49 (H) 07/26/2022   ALKPHOS 71 07/26/2022   BILITOT 0.2 (L) 07/26/2022   GFRNONAA >60 07/27/2022   GFRAA >60 04/01/2017    Lab Results  Component Value Date   CEA1 1.7 09/05/2020   CEA 1.68 05/14/2022    Lab Results  Component Value Date   INR 1.1 02/10/2021   LABPROT 14.4 02/10/2021    Imaging:  No results found.  Medications: I have reviewed the patient's current medications.   Assessment/Plan: Transverse colon cancer, stage IIIb (T3N1a), status post a partial transverse colectomy and end colostomy  09/10/2020 CT abdomen/pelvis 09/04/2020- possible transverse colon mass with small regional lymph nodes Colonoscopy 09/05/2020- obstructing transverse colon mass-biopsy invasive adenocarcinoma, mass could not be passed CT chest 09/09/2020-no evidence of metastatic disease, 3 x 2 mm subpleural nodule in the right upper lobe likely benign subpleural lymph node Partial transverse colectomy 09/10/2020, transverse colon tumor, no lymphovascular perineural invasion, 1/19 lymph nodes positive, negative margins, MSI stable, no loss of mismatch repair protein expression Cycle 1 CAPOX 10/08/2020, oxaliplatin dose reduced to 100 mg per metered squared secondary to renal insufficiency Cycle 2 CAPOX 10/29/2020 Cycle 3 CAPOX 11/19/2020, capecitabine dose reduced secondary to hand/foot syndrome, oxaliplatin dose reduced secondary to renal failure and poor performance status CT chest/abdomen/pelvis without contrast 12/01/2020-no acute abnormality in the chest, abdomen, or pelvis.  No evidence of obstruction. CTs 03/05/2022-no evidence of recurrent disease CT abdomen/pelvis 07/09/2022 and 07/21/2022-indeterminate segment 7 lesion measuring 1.2 cm Resection of a frontal meningioma 09/01/2016 Diabetes Rectal polyp- tubular adenoma on colonoscopy 09/05/2020 Hypertension Diabetic neuropathy Epilepsy Family history of breast and prostate cancer Admission with acute renal failure 10/01/2020-secondary to lack of oral intake and lisinopril, improved with intravenous hydration and holding lisinopril Admission 12/01/2020-sepsis, diarrhea HHS, A. fib with RVR, abdominal pain 11.  Respiratory failure requiring intubation 12/06/2020 Tracheostomy 12/18/2020 12.  Progressive renal failure-CRRT started 12/06/2020, discontinued after 12/16/2020, hemodialysis initiated 13.  Thrombocytopenia secondary to chemotherapy and sepsis, heparin-induced platelet  antibody and SRA negative-resolved 14.  Right lower extremity DVT diagnosed 12/12/2020-on apixaban   15.  Anemia secondary to chronic disease, renal failure, and phlebotomy 16.  Admission 09/29/2021 with rotavirus    Disposition: Mr. Dauzat is in clinical remission from colon cancer.  He has recovered from the colostomy takedown procedure.  He will return for an office visit and surveillance imaging in October.  I reviewed the hospital records from the colostomy takedown admission.  A CT abdomen/pelvis on 07/21/2022 revealed an indeterminate 1.2 cm right liver lesion.  An MRI is recommended for further characterization.  I will contact Mr. Bitter with this and again recommendation.  Thornton Papas, MD  09/10/2022  11:08 AM

## 2022-09-13 ENCOUNTER — Telehealth: Payer: Self-pay

## 2022-09-13 NOTE — Telephone Encounter (Signed)
-----   Message from Ladene Artist, MD sent at 09/10/2022  4:50 PM EDT ----- Please call patient, CEA is normal, follow-up as scheduled

## 2022-09-13 NOTE — Telephone Encounter (Signed)
Patient gave verbal understanding and had no further questions or concerns at this time 

## 2022-09-22 ENCOUNTER — Encounter: Payer: Self-pay | Admitting: *Deleted

## 2022-09-22 ENCOUNTER — Other Ambulatory Visit: Payer: Self-pay | Admitting: *Deleted

## 2022-09-22 DIAGNOSIS — I739 Peripheral vascular disease, unspecified: Secondary | ICD-10-CM

## 2022-09-22 NOTE — Progress Notes (Signed)
Called Thomas [name] to request they reach out to patient to schedule MRI liver that is ordered. Also sent Thomas [name] Mychart message to call them to expedite process.

## 2022-10-01 ENCOUNTER — Ambulatory Visit: Payer: Medicare PPO

## 2022-10-01 ENCOUNTER — Ambulatory Visit (HOSPITAL_COMMUNITY): Payer: Medicare PPO

## 2022-10-07 ENCOUNTER — Ambulatory Visit: Payer: Self-pay | Admitting: Surgery

## 2022-10-07 ENCOUNTER — Encounter (HOSPITAL_COMMUNITY): Payer: Self-pay | Admitting: Surgery

## 2022-10-07 ENCOUNTER — Other Ambulatory Visit: Payer: Self-pay

## 2022-10-07 NOTE — Progress Notes (Signed)
Spoke with pt for pre-op call. Pt has an extensive medical history of colon cancer, renal failure, had a tracheostomy (states no longer has this) due to acute respiratory failure in 2022. Pt is coming in to have his port removed.  Pt states his last A1C was today and it was 5.5. He is on no diabetic medications at this time.   Shower instructions given to pt and he voiced understanding.

## 2022-10-07 NOTE — H&P (Addendum)
Thomas [name] is an 67 y.o. male.   HPI: 30M s/p exlap, Hartmann for colon cancer, now s/p completion of treatment and colostomy reversal. Here today for port removal. Noted RLE swelling for the past week, has been evaluated by Hillery Aldo, NP 10/07/22.   Past Medical History:  Diagnosis Date   Acute encephalopathy 04/09/2014   Acute ischemic left MCA stroke (HCC) 04/09/2014   Acute renal failure with acute cortical necrosis (HCC)    Acute respiratory failure with hypoxemia (HCC) 12/07/2020   Acute respiratory failure with hypoxia (HCC) 04/09/2014   Anxiety    Brain tumor (HCC)    frontal   Chronic kidney disease    Colon cancer (HCC)    Confusion    occasionally   Depression    Diabetes mellitus without complication (HCC)    Dizziness    Duodenal ulcer disease    Epilepsy (HCC)    takes Keppra daily   GERD (gastroesophageal reflux disease)    takes Omeprazole daily   Headache    Heart murmur    Hyperlipidemia    takes Atorvastatin daily   Hypertension    Meningioma (HCC) 09/01/2016   Peripheral edema    takes Lasix daily   Peripheral neuropathy    takes Gabapentin daily   Seizures (HCC)    Sleep apnea    no cpap   Urinary frequency    Viral gastroenteritis 09/29/2021    Past Surgical History:  Procedure Laterality Date   BIOPSY  09/05/2020   Procedure: BIOPSY;  Surgeon: Hilarie Fredrickson, MD;  Location: [facility] ENDOSCOPY;  Service: Endoscopy;;   BIOPSY  02/06/2021   Procedure: BIOPSY;  Surgeon: Beverley Fiedler, MD;  Location: [facility] ENDOSCOPY;  Service: Gastroenterology;;   CARDIAC CATHETERIZATION  7 yrs ago   COLONOSCOPY WITH PROPOFOL N/A 09/05/2020   Procedure: COLONOSCOPY WITH PROPOFOL;  Surgeon: Hilarie Fredrickson, MD;  Location: [facility] ENDOSCOPY;  Service: Endoscopy;  Laterality: N/A;   CRANIOTOMY N/A 09/01/2016   Procedure: CRANIOTOMY TUMOR  LEFT PTERIONAL;  Surgeon: Coletta Memos, MD;  Location: [facility] OR;  Service: Neurosurgery;  Laterality: N/A;   cyst removed from chest      as a  child   ESOPHAGOGASTRODUODENOSCOPY (EGD) WITH PROPOFOL N/A 02/06/2021   Procedure: ESOPHAGOGASTRODUODENOSCOPY (EGD) WITH PROPOFOL;  Surgeon: Beverley Fiedler, MD;  Location: [facility] ENDOSCOPY;  Service: Gastroenterology;  Laterality: N/A;   INGUINAL HERNIA REPAIR N/A 07/01/2022   Procedure: HERNIA REPAIR INGUINAL INCARCERATED;  Surgeon: Karie Soda, MD;  Location: [facility] ORS;  Service: General;  Laterality: N/A;   IR FLUORO GUIDE CV LINE LEFT  12/22/2020   IR GASTROSTOMY TUBE MOD SED  02/10/2021   IR GASTROSTOMY TUBE REMOVAL  04/15/2021   IR REMOVAL TUN CV CATH W/O FL  03/09/2021   IR US GUIDE VASC ACCESS LEFT  12/22/2020   LYSIS OF ADHESION N/A 07/01/2022   Procedure: LYSIS OF ADHESION;  Surgeon: Karie Soda, MD;  Location: [facility] ORS;  Service: General;  Laterality: N/A;   NO PAST SURGERIES     PARTIAL COLECTOMY N/A 09/10/2020   Procedure: TRANSVERSE COLECTOMY;  Surgeon: Diamantina Monks, MD;  Location: [facility] OR;  Service: General;  Laterality: N/A;   POLYPECTOMY  09/05/2020   Procedure: POLYPECTOMY;  Surgeon: Hilarie Fredrickson, MD;  Location: [facility] ENDOSCOPY;  Service: Endoscopy;;   PORTACATH PLACEMENT Right 10/01/2020   Procedure: INSERTION PORT-A-CATH;  Surgeon: Diamantina Monks, MD;  Location: [facility] OR;  Service: General;  Laterality: Right;  SUBMUCOSAL TATTOO INJECTION  09/05/2020   Procedure: SUBMUCOSAL TATTOO INJECTION;  Surgeon: Hilarie Fredrickson, MD;  Location: [facility] ENDOSCOPY;  Service: Endoscopy;;   TRACHEOSTOMY TUBE PLACEMENT N/A 01/05/2021   Procedure: TRACHEOSTOMY;  Surgeon: Serena Colonel, MD;  Location: [facility] OR;  Service: ENT;  Laterality: N/A;   XI ROBOTIC ASSISTED COLOSTOMY TAKEDOWN N/A 07/01/2022   Procedure: ROBOTIC OSTOMY TAKEDOWN WITH INTRACORPOREAL ANASTOMOSIS WITH FIREFLY;  Surgeon: Karie Soda, MD;  Location: [facility] ORS;  Service: General;  Laterality: N/A;    Family History  Problem Relation Age of Onset   Breast cancer Mother    Breast cancer Maternal Aunt    Prostate cancer Maternal Uncle    Colon cancer  Neg Hx    Esophageal cancer Neg Hx    Rectal cancer Neg Hx    Stomach cancer Neg Hx     Social History:  reports that he has quit smoking. He has never used smokeless tobacco. He reports that he does not drink alcohol and does not use drugs.  Allergies:  Allergies  Allergen Reactions   Morphine And Codeine Other (See Comments)    Throat swelling (can tolerate oxycodone/hydrocodone/hydromorphone)    Medications: I have reviewed the patient's current medications.  No results found for this or any previous visit (from the past 48 hour(s)).  No results found.  ROS 10 point review of systems is negative except as listed above in HPI.   Physical Exam There were no vitals taken for this visit. Constitutional: well-developed, well-nourished HEENT: pupils equal, round, reactive to light, 2mm b/l, moist conjunctiva, external inspection of ears and nose normal, hearing intact Oropharynx: normal oropharyngeal mucosa, normal dentition Neck: no thyromegaly, trachea midline, no midline cervical tenderness to palpation Chest: breath sounds equal bilaterally, normal respiratory effort, no midline or lateral chest wall tenderness to palpation/deformity Abdomen: soft, NT, no bruising, no hepatosplenomegaly GU: no blood at urethral meatus of penis, no scrotal masses or abnormality  Back: no wounds, no thoracic/lumbar spine tenderness to palpation, no thoracic/lumbar spine stepoffs Rectal: deferred Extremities: 2+ radial and pedal pulses bilaterally, intact motor and sensation bilateral UE and LE, + peripheral edema on R without associate erythema or calf tenderness MSK: normal gait/station, no clubbing/cyanosis of fingers/toes, normal ROM of all four extremities Skin: warm, dry, no rashes Psych: normal memory, normal mood/affect     Assessment/Plan: 73M here today for port removal. Informed consent was obtained after detailed explanation of risks, including bleeding, infection, hematoma,  seroma, abscess, temporary or permanent neuropathy, hernia recurrence. All questions answered to the patient's satisfaction.   Diamantina Monks, MD General and Trauma Surgery [facility] Surgery

## 2022-10-08 ENCOUNTER — Ambulatory Visit (HOSPITAL_COMMUNITY): Payer: Medicare PPO

## 2022-10-08 ENCOUNTER — Ambulatory Visit (HOSPITAL_BASED_OUTPATIENT_CLINIC_OR_DEPARTMENT_OTHER): Payer: Medicare PPO | Admitting: Anesthesiology

## 2022-10-08 ENCOUNTER — Encounter (HOSPITAL_COMMUNITY)
Admission: RE | Disposition: A | Discharge status: Home / Self Care | Payer: Self-pay | Source: Home / Self Care | Attending: Surgery

## 2022-10-08 ENCOUNTER — Ambulatory Visit (HOSPITAL_COMMUNITY): Payer: Medicare PPO | Admitting: Anesthesiology

## 2022-10-08 ENCOUNTER — Other Ambulatory Visit: Payer: Self-pay

## 2022-10-08 ENCOUNTER — Ambulatory Visit (HOSPITAL_COMMUNITY)
Admission: RE | Admit: 2022-10-08 | Discharge: 2022-10-08 | Disposition: A | Discharge status: Home / Self Care | Payer: Medicare PPO | Attending: Surgery | Admitting: Surgery

## 2022-10-08 DIAGNOSIS — I129 Hypertensive chronic kidney disease with stage 1 through stage 4 chronic kidney disease, or unspecified chronic kidney disease: Secondary | ICD-10-CM | POA: Diagnosis not present

## 2022-10-08 DIAGNOSIS — Z8711 Personal history of peptic ulcer disease: Secondary | ICD-10-CM | POA: Insufficient documentation

## 2022-10-08 DIAGNOSIS — E1122 Type 2 diabetes mellitus with diabetic chronic kidney disease: Secondary | ICD-10-CM | POA: Diagnosis not present

## 2022-10-08 DIAGNOSIS — Z8673 Personal history of transient ischemic attack (TIA), and cerebral infarction without residual deficits: Secondary | ICD-10-CM | POA: Insufficient documentation

## 2022-10-08 DIAGNOSIS — N189 Chronic kidney disease, unspecified: Secondary | ICD-10-CM | POA: Diagnosis not present

## 2022-10-08 DIAGNOSIS — Z87891 Personal history of nicotine dependence: Secondary | ICD-10-CM | POA: Insufficient documentation

## 2022-10-08 DIAGNOSIS — Z452 Encounter for adjustment and management of vascular access device: Secondary | ICD-10-CM | POA: Insufficient documentation

## 2022-10-08 DIAGNOSIS — G4730 Sleep apnea, unspecified: Secondary | ICD-10-CM | POA: Insufficient documentation

## 2022-10-08 DIAGNOSIS — Z85038 Personal history of other malignant neoplasm of large intestine: Secondary | ICD-10-CM | POA: Diagnosis not present

## 2022-10-08 DIAGNOSIS — I10 Essential (primary) hypertension: Secondary | ICD-10-CM

## 2022-10-08 DIAGNOSIS — G40909 Epilepsy, unspecified, not intractable, without status epilepticus: Secondary | ICD-10-CM | POA: Insufficient documentation

## 2022-10-08 HISTORY — PX: PORT-A-CATH REMOVAL: SHX5289

## 2022-10-08 HISTORY — DX: Gout, unspecified: M10.9

## 2022-10-08 LAB — BASIC METABOLIC PANEL
Anion gap: 9 (ref 5–15)
BUN: 44 mg/dL — ABNORMAL HIGH (ref 8–23)
CO2: 21 mmol/L — ABNORMAL LOW (ref 22–32)
Calcium: 9 mg/dL (ref 8.9–10.3)
Chloride: 108 mmol/L (ref 98–111)
Creatinine, Ser: 1.81 mg/dL — ABNORMAL HIGH (ref 0.61–1.24)
GFR, Estimated: 41 mL/min — ABNORMAL LOW (ref >60)
Glucose, Bld: 106 mg/dL — ABNORMAL HIGH (ref 70–99)
Potassium: 3.9 mmol/L (ref 3.5–5.1)
Sodium: 138 mmol/L (ref 135–145)

## 2022-10-08 LAB — GLUCOSE, CAPILLARY
Glucose-Capillary: 90 mg/dL (ref 70–99)
Glucose-Capillary: 91 mg/dL (ref 70–99)
Glucose-Capillary: 87 mg/dL (ref 70–99)

## 2022-10-08 LAB — CBC
HCT: 36.1 % — ABNORMAL LOW (ref 39.0–52.0)
Hemoglobin: 12.4 g/dL — ABNORMAL LOW (ref 13.0–17.0)
MCH: 25.9 pg — ABNORMAL LOW (ref 26.0–34.0)
MCHC: 34.3 g/dL (ref 30.0–36.0)
MCV: 75.5 fL — ABNORMAL LOW (ref 80.0–100.0)
Platelets: 275 10*3/uL (ref 150–400)
RBC: 4.78 MIL/uL (ref 4.22–5.81)
RDW: 15.9 % — ABNORMAL HIGH (ref 11.5–15.5)
WBC: 10.6 10*3/uL — ABNORMAL HIGH (ref 4.0–10.5)
nRBC: 0 % (ref 0.0–0.2)

## 2022-10-08 SURGERY — REMOVAL PORT-A-CATH
Anesthesia: Monitor Anesthesia Care

## 2022-10-08 MED ORDER — DOCUSATE SODIUM 100 MG PO CAPS: 100.0000 mg | ORAL_CAPSULE | Freq: Two times a day (BID) | ORAL | 1 refills | Status: DC

## 2022-10-08 MED ORDER — ACETAMINOPHEN 10 MG/ML IV SOLN: 1000.0000 mg | Freq: Once | INTRAVENOUS | Status: DC | PRN

## 2022-10-08 MED ORDER — ACETAMINOPHEN 160 MG/5ML PO SOLN: 325.0000 mg | ORAL | Status: DC | PRN

## 2022-10-08 MED ORDER — CHLORHEXIDINE GLUCONATE CLOTH 2 % EX PADS
6.0000 | MEDICATED_PAD | Freq: Once | CUTANEOUS | Status: AC
  Administered 2022-10-08: 6 via TOPICAL

## 2022-10-08 MED ORDER — PROPOFOL 10 MG/ML IV BOLUS
INTRAVENOUS | Status: DC | PRN
  Administered 2022-10-08: 20 mg via INTRAVENOUS
  Administered 2022-10-08: 30 mg via INTRAVENOUS

## 2022-10-08 MED ORDER — BUPIVACAINE LIPOSOME 1.3 % IJ SUSP
INTRAMUSCULAR | Status: DC | PRN
  Administered 2022-10-08: 30 mL via SURGICAL_CAVITY

## 2022-10-08 MED ORDER — BUPIVACAINE HCL (PF) 0.25 % IJ SOLN
INTRAMUSCULAR | Status: AC
  Filled 2022-10-08: qty 10

## 2022-10-08 MED ORDER — LIDOCAINE 2% (20 MG/ML) 5 ML SYRINGE
INTRAMUSCULAR | Status: DC | PRN
  Administered 2022-10-08: 40 mg via INTRAVENOUS
  Administered 2022-10-08: 20 mg via INTRAVENOUS

## 2022-10-08 MED ORDER — OXYCODONE HCL 5 MG PO TABS: 5.0000 mg | ORAL_TABLET | Freq: Once | ORAL | Status: DC | PRN

## 2022-10-08 MED ORDER — CEFAZOLIN SODIUM-DEXTROSE 2-4 GM/100ML-% IV SOLN
2.0000 g | INTRAVENOUS | Status: AC
  Administered 2022-10-08: 2 g via INTRAVENOUS
  Filled 2022-10-08: qty 100

## 2022-10-08 MED ORDER — INSULIN ASPART 100 UNIT/ML IJ SOLN: 0.0000 [IU] | INTRAMUSCULAR | Status: DC | PRN

## 2022-10-08 MED ORDER — ACETAMINOPHEN 500 MG PO TABS: 1000.0000 mg | ORAL_TABLET | Freq: Four times a day (QID) | ORAL | 1 refills | Status: AC

## 2022-10-08 MED ORDER — LACTATED RINGERS IV SOLN: INTRAVENOUS | Status: DC

## 2022-10-08 MED ORDER — OXYCODONE HCL 5 MG/5ML PO SOLN: 5.0000 mg | Freq: Once | ORAL | Status: DC | PRN

## 2022-10-08 MED ORDER — PROMETHAZINE HCL 25 MG/ML IJ SOLN: 6.2500 mg | INTRAMUSCULAR | Status: DC | PRN

## 2022-10-08 MED ORDER — ACETAMINOPHEN 325 MG PO TABS: 325.0000 mg | ORAL_TABLET | ORAL | Status: DC | PRN

## 2022-10-08 MED ORDER — CHLORHEXIDINE GLUCONATE 0.12 % MT SOLN
15.0000 mL | Freq: Once | OROMUCOSAL | Status: AC
  Administered 2022-10-08: 15 mL via OROMUCOSAL
  Filled 2022-10-08: qty 15

## 2022-10-08 MED ORDER — CHLORHEXIDINE GLUCONATE CLOTH 2 % EX PADS: 6.0000 | MEDICATED_PAD | Freq: Once | CUTANEOUS | Status: DC

## 2022-10-08 MED ORDER — ORAL CARE MOUTH RINSE: 15.0000 mL | Freq: Once | OROMUCOSAL | Status: AC

## 2022-10-08 MED ORDER — FENTANYL CITRATE (PF) 100 MCG/2ML IJ SOLN
INTRAMUSCULAR | Status: DC | PRN
  Administered 2022-10-08: 50 ug via INTRAVENOUS

## 2022-10-08 MED ORDER — OXYCODONE HCL 5 MG PO TABS: 5.0000 mg | ORAL_TABLET | ORAL | 0 refills | Status: DC | PRN

## 2022-10-08 MED ORDER — BUPIVACAINE LIPOSOME 1.3 % IJ SUSP
20.0000 mL | Freq: Once | INTRAMUSCULAR | Status: DC
  Filled 2022-10-08: qty 10

## 2022-10-08 MED ORDER — AMISULPRIDE (ANTIEMETIC) 5 MG/2ML IV SOLN: 10.0000 mg | Freq: Once | INTRAVENOUS | Status: DC | PRN

## 2022-10-08 MED ORDER — METHOCARBAMOL 750 MG PO TABS: 750.0000 mg | ORAL_TABLET | Freq: Four times a day (QID) | ORAL | 0 refills | Status: DC

## 2022-10-08 MED ORDER — PROPOFOL 500 MG/50ML IV EMUL
INTRAVENOUS | Status: DC | PRN
  Administered 2022-10-08: 80 ug/kg/min via INTRAVENOUS

## 2022-10-08 MED ORDER — IBUPROFEN 600 MG PO TABS: 600.0000 mg | ORAL_TABLET | Freq: Four times a day (QID) | ORAL | 0 refills | Status: DC

## 2022-10-08 MED ORDER — FENTANYL CITRATE (PF) 100 MCG/2ML IJ SOLN
INTRAMUSCULAR | Status: AC
  Filled 2022-10-08: qty 2

## 2022-10-08 MED ORDER — ONDANSETRON HCL 4 MG/2ML IJ SOLN
INTRAMUSCULAR | Status: DC | PRN
  Administered 2022-10-08: 4 mg via INTRAVENOUS

## 2022-10-08 MED ORDER — FENTANYL CITRATE (PF) 100 MCG/2ML IJ SOLN: 25.0000 ug | INTRAMUSCULAR | Status: DC | PRN

## 2022-10-08 SURGICAL SUPPLY — 35 items
ADH SKN CLS LQ APL DERMABOND (GAUZE/BANDAGES/DRESSINGS) ×1
APL PRP STRL LF ISPRP CHG 10.5 (MISCELLANEOUS) ×1
APPLICATOR CHLORAPREP 10.5 ORG (MISCELLANEOUS) ×2 IMPLANT
BAG DECANTER FOR FLEXI CONT (MISCELLANEOUS) ×2 IMPLANT
COVER SURGICAL LIGHT HANDLE (MISCELLANEOUS) ×2 IMPLANT
COVER TRANSDUCER ULTRASND GEL (DISPOSABLE) IMPLANT
DERMABOND ADVANCED .7 DNX6 (GAUZE/BANDAGES/DRESSINGS) IMPLANT
DRAPE C-ARM 42X120 X-RAY (DRAPES) ×2 IMPLANT
ELECT CAUTERY BLADE 6.4 (BLADE) ×2 IMPLANT
ELECT REM PT RETURN 9FT ADLT (ELECTROSURGICAL) ×1
ELECTRODE REM PT RTRN 9FT ADLT (ELECTROSURGICAL) ×2 IMPLANT
GEL ULTRASOUND 20GR AQUASONIC (MISCELLANEOUS) IMPLANT
GLOVE BIO SURGEON STRL SZ 6.5 (GLOVE) ×2 IMPLANT
GLOVE BIOGEL PI IND STRL 6 (GLOVE) ×2 IMPLANT
GOWN STRL REUS W/ TWL LRG LVL3 (GOWN DISPOSABLE) ×4 IMPLANT
GOWN STRL REUS W/TWL LRG LVL3 (GOWN DISPOSABLE) ×2
INTRODUCER COOK 11FR (CATHETERS) IMPLANT
KIT BASIN OR (CUSTOM PROCEDURE TRAY) ×2 IMPLANT
KIT TURNOVER KIT B (KITS) ×2 IMPLANT
NS IRRIG 1000ML POUR BTL (IV SOLUTION) ×2 IMPLANT
PAD ARMBOARD 7.5X6 YLW CONV (MISCELLANEOUS) ×2 IMPLANT
PENCIL BUTTON HOLSTER BLD 10FT (ELECTRODE) ×2 IMPLANT
POSITIONER HEAD DONUT 9IN (MISCELLANEOUS) ×2 IMPLANT
SET INTRODUCER 12FR PACEMAKER (INTRODUCER) IMPLANT
SET SHEATH INTRODUCER 10FR (MISCELLANEOUS) IMPLANT
SHEATH COOK PEEL AWAY SET 9F (SHEATH) IMPLANT
SUT MNCRL AB 4-0 PS2 18 (SUTURE) ×2 IMPLANT
SUT PROLENE 2 0 SH DA (SUTURE) ×2 IMPLANT
SUT VIC AB 2-0 SH 18 (SUTURE) IMPLANT
SUT VIC AB 3-0 SH 27 (SUTURE) ×1
SUT VIC AB 3-0 SH 27X BRD (SUTURE) ×2 IMPLANT
SYR 5ML LUER SLIP (SYRINGE) ×2 IMPLANT
TOWEL GREEN STERILE (TOWEL DISPOSABLE) ×2 IMPLANT
TOWEL GREEN STERILE FF (TOWEL DISPOSABLE) ×2 IMPLANT
TRAY LAPAROSCOPIC MC (CUSTOM PROCEDURE TRAY) ×2 IMPLANT

## 2022-10-08 NOTE — Transfer of Care (Signed)
Immediate Anesthesia Transfer of Care Note  Patient: Thomas [name]  Procedure(s) Performed: REMOVAL PORT-A-CATH  Patient Location: PACU  Anesthesia Type:MAC  Level of Consciousness: awake, alert , and oriented  Airway & Oxygen Therapy: Patient Spontanous Breathing  Post-op Assessment: Report given to RN and Post -op Vital signs reviewed and stable  Post vital signs: Reviewed and stable  Last Vitals:  Vitals Value Taken Time  BP 120/77 10/08/22 1004  Temp    Pulse 69 10/08/22 1009  Resp 16 10/08/22 1009  SpO2 99 % 10/08/22 1009  Vitals shown include unvalidated device data.  Last Pain:  Vitals:   10/08/22 0717  TempSrc:   PainSc: 3       Patients Stated Pain Goal: 1 (10/08/22 0717)  Complications: No notable events documented.

## 2022-10-08 NOTE — Anesthesia Postprocedure Evaluation (Signed)
Anesthesia Post Note  Patient: LAWRENC FARRER  Procedure(s) Performed: REMOVAL PORT-A-CATH     Patient location during evaluation: PACU Anesthesia Type: MAC Level of consciousness: awake and alert Pain management: pain level controlled Vital Signs Assessment: post-procedure vital signs reviewed and stable Respiratory status: spontaneous breathing, nonlabored ventilation, respiratory function stable and patient connected to nasal cannula oxygen Cardiovascular status: stable and blood pressure returned to baseline Postop Assessment: no apparent nausea or vomiting Anesthetic complications: no  No notable events documented.  Last Vitals:  Vitals:   10/08/22 1100 10/08/22 1115  BP: (!) 154/83 (!) 145/81  Pulse: 61 65  Resp: 12 (!) 23  Temp:  (!) 36.4 C  SpO2: 100% 99%    Last Pain:  Vitals:   10/08/22 1100  TempSrc:   PainSc: 0-No pain                 Shelton Silvas

## 2022-10-08 NOTE — Discharge Instructions (Signed)
May shower beginning 10/09/2022. Do not peel off or scrub skin glue. May allow warm soapy water to run over incision, then rinse and pat dry. Do not soak in any water (tubs, hot tubs, pools, lakes, oceans) for one week.   No lifting greater than 5 pounds for six weeks. May resume sexual activity when it is comfortable.   Pain regimen: take over-the-counter tylenol (acetaminophen) 1000mg  every six hours, the prescription ibuprofen (600mg ) every six hours and the robaxin (methocarbamol) 750mg  every six hours. With all three of these, you should be taking something every two hours. Example: tylenol ( acetaminophen) at 8am, ibuprofen at 10am, robaxin (methocarbamol) at 12pm, tylenol (acetaminophen) again at 2pm, ibuprofen again at 4pm, robaxin (methocarbamol) at 6pm. You also have a prescription for oxycodone, which should be taken if the tylenol (acetaminophen), ibuprofen, and robaxin (methocarbamol) are not enough to control your pain. You may take the oxycodone as frequently as every four hours as needed, but if you are taking the other medications as above, you should not need the oxycodone this frequently. You have also been given a prescription for colace (docusate) which is a stool softener. Please take this as prescribed because the oxycodone can cause constipation and the colace (docusate) will minimize or prevent constipation. Do not drive while taking or under the influence of the oxycodone as it is a narcotic medication.  Call the office at [phone] for temperature greater than 101.57F, worsening pain, redness or warmth at the incision site.  Please call [phone] to make an appointment for 2-3 weeks after surgery for wound check.

## 2022-10-08 NOTE — Op Note (Signed)
   Operative Note   Date: 10/08/2022  Procedure: removal of port-a-cath  Pre-op diagnosis: port in place for chemotherapy Post-op diagnosis: same  Indication and clinical history: The patient is a 67 y.o. year old male with port in place for chemotherapy, which he has now completed     Surgeon: Diamantina Monks, MD  Anesthesiologist: Hart Rochester, MD Anesthesia: General  Findings:  Specimen: port EBL: <5cc Drains/Implants: none  Disposition: PACU - hemodynamically stable.  Description of procedure: The patient was positioned supine on the operating room table. MAC anesthetic induction was uneventful. Time-out was performed verifying correct patient, procedure, signature of informed consent, and administration of pre-operative antibiotics. The right upper chest was prepped and draped in the usual sterile fashion.  Local anesthetic was infiltrated. An incision was made and deepened until the port was reached. The port was circumferentially excised and the catheter removed. The catheter was removed in its entirety. Manual pressure was held at the site of catheter insertion into the vessel. The wound was closed in layers using 2-0 vicryl deep and 4-0 monocryl superficially. Dermabond was applied as sterile dressing.   All sponge and instrument counts were correct at the conclusion of the procedure. The patient was awakened from anesthesia, extubated uneventfully, and transported to the PACU in good condition. There were no complications.    Diamantina Monks, MD General and Trauma Surgery [facility] Surgery

## 2022-10-08 NOTE — Anesthesia Preprocedure Evaluation (Addendum)
Anesthesia Evaluation  Patient identified by MRN, date of birth, ID band Patient awake    Reviewed: Allergy & Precautions, NPO status , Patient's Chart, lab work & pertinent test results  Airway Mallampati: II  TM Distance: >3 FB Neck ROM: Full    Dental  (+) Teeth Intact, Dental Advisory Given   Pulmonary sleep apnea , former smoker   breath sounds clear to auscultation       Cardiovascular hypertension, + Valvular Problems/Murmurs  Rhythm:Regular Rate:Normal     Neuro/Psych  Headaches, Seizures -,  PSYCHIATRIC DISORDERS Anxiety Depression    CVA    GI/Hepatic Neg liver ROS, PUD,GERD  ,,  Endo/Other  diabetes    Renal/GU Renal disease     Musculoskeletal   Abdominal   Peds  Hematology   Anesthesia Other Findings   Reproductive/Obstetrics                             Anesthesia Physical Anesthesia Plan  ASA: 3  Anesthesia Plan: MAC   Post-op Pain Management: Minimal or no pain anticipated   Induction: Intravenous  PONV Risk Score and Plan: 0 and Ondansetron, Midazolam and Propofol infusion  Airway Management Planned: Natural Airway and Simple Face Mask  Additional Equipment: None  Intra-op Plan:   Post-operative Plan:   Informed Consent: I have reviewed the patients History and Physical, chart, labs and discussed the procedure including the risks, benefits and alternatives for the proposed anesthesia with the patient or authorized representative who has indicated his/her understanding and acceptance.     Dental advisory given  Plan Discussed with: CRNA  Anesthesia Plan Comments:        Anesthesia Quick Evaluation

## 2022-10-08 NOTE — Progress Notes (Signed)
Dr. Bedelia Person requested patient's CBG not be rechecked. Dr. Hart Rochester was made aware.

## 2022-10-09 ENCOUNTER — Encounter (HOSPITAL_COMMUNITY): Payer: Self-pay | Admitting: Surgery

## 2022-10-29 ENCOUNTER — Other Ambulatory Visit: Payer: Medicare PPO

## 2022-11-12 ENCOUNTER — Ambulatory Visit (HOSPITAL_COMMUNITY): Payer: Medicare PPO

## 2022-11-12 ENCOUNTER — Ambulatory Visit: Payer: Medicare PPO

## 2022-12-03 ENCOUNTER — Other Ambulatory Visit: Payer: Self-pay

## 2022-12-03 DIAGNOSIS — I739 Peripheral vascular disease, unspecified: Secondary | ICD-10-CM

## 2022-12-09 ENCOUNTER — Inpatient Hospital Stay: Admission: RE | Admit: 2022-12-09 | Payer: Medicare PPO | Source: Ambulatory Visit

## 2022-12-17 ENCOUNTER — Encounter (HOSPITAL_COMMUNITY): Payer: Medicare PPO

## 2022-12-17 ENCOUNTER — Ambulatory Visit: Payer: Medicare PPO

## 2023-01-14 ENCOUNTER — Encounter (HOSPITAL_COMMUNITY): Payer: Medicare PPO

## 2023-01-14 ENCOUNTER — Ambulatory Visit: Payer: Medicare PPO

## 2023-01-21 ENCOUNTER — Ambulatory Visit: Payer: Medicare PPO

## 2023-01-21 ENCOUNTER — Ambulatory Visit (HOSPITAL_COMMUNITY)
Admission: RE | Admit: 2023-01-21 | Discharge: 2023-01-21 | Disposition: A | Discharge status: Home / Self Care | Payer: Medicare PPO | Source: Ambulatory Visit | Attending: Vascular Surgery | Admitting: Vascular Surgery

## 2023-01-21 ENCOUNTER — Encounter (HOSPITAL_COMMUNITY): Payer: Medicare PPO

## 2023-01-21 DIAGNOSIS — I739 Peripheral vascular disease, unspecified: Secondary | ICD-10-CM

## 2023-01-21 LAB — VAS US ABI WITH/WO TBI

## 2023-01-28 ENCOUNTER — Ambulatory Visit (INDEPENDENT_AMBULATORY_CARE_PROVIDER_SITE_OTHER): Payer: Medicare PPO | Admitting: Physician Assistant

## 2023-01-28 VITALS — BP 149/78 | HR 71 | Temp 98.0°F | Ht 68.0 in | Wt 206.4 lb

## 2023-01-28 DIAGNOSIS — I739 Peripheral vascular disease, unspecified: Secondary | ICD-10-CM

## 2023-01-28 NOTE — Progress Notes (Signed)
HISTORY AND PHYSICAL     CC:  follow up. Requesting Provider:  Hillery Aldo, NP  HPI: This is a 67 y.o. male who is here today for follow up for PAD.  Pt has hx of diabetes with previous weight loss of over 110 lbs.   Pt was last seen 09/26/2022 and at that time, he was doing well and was following up with vascular due to DM and decreased pulses.   The pt returns today for follow up.  Since his last visit, he underwent colectomy and colostomy as well as reversal.  He states he had a long road but is doing well now.  He denies any claudication, rest pain or non healing wounds.  He does get some cramps at night that he will take in some mustard and this helps.  He does have some neuropathy but is able to feel his feet.  He has cataracts that he is getting ready to surgery for.  He does have a abdominal hernia that is not bothersome to him. He states he soaks his feet is isopropyl alcohol and then applies udderly smooth lotion.  He does have some swelling in his legs.  He wears some compression that does help with this.    The pt is on a statin for cholesterol management.    The pt is not on an aspirin.    Other AC:  none The pt is on CCB for hypertension.  The pt is not on medication for diabetes. Tobacco hx:  former  Pt did serve in the [employer].    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   Acute renal failure with acute cortical necrosis (HCC)    Acute respiratory failure with hypoxemia (HCC) 12/07/2020   Acute respiratory failure with hypoxia (HCC) 04/09/2014   Anxiety    Brain tumor (HCC)    frontal   Chronic kidney disease    Colon cancer (HCC)    Confusion    occasionally   Depression    Diabetes mellitus without complication (HCC)    Dizziness    Duodenal ulcer disease    Epilepsy (HCC)    takes Keppra daily    GERD (gastroesophageal reflux disease)    takes Omeprazole daily   Gout    Headache    Heart murmur    Hyperlipidemia    takes Atorvastatin daily   Hypertension    Meningioma (HCC) 09/01/2016   Peripheral edema    takes Lasix daily   Peripheral neuropathy    takes Gabapentin daily   Seizures (HCC)    Sleep apnea    no cpap   Urinary frequency    Viral gastroenteritis 09/29/2021    Past Surgical History:  Procedure Laterality Date   BIOPSY  09/05/2020   Procedure: BIOPSY;  Surgeon: Hilarie Fredrickson, MD;  Location: [facility] ENDOSCOPY;  Service: Endoscopy;;   BIOPSY  02/06/2021   Procedure: BIOPSY;  Surgeon: Beverley Fiedler, MD;  Location: [facility] ENDOSCOPY;  Service: Gastroenterology;;   CARDIAC CATHETERIZATION  7 yrs ago   COLONOSCOPY WITH PROPOFOL N/A 09/05/2020   Procedure: COLONOSCOPY WITH PROPOFOL;  Surgeon: Hilarie Fredrickson, MD;  Location: [facility] ENDOSCOPY;  Service: Endoscopy;  Laterality: N/A;   CRANIOTOMY N/A 09/01/2016   Procedure: CRANIOTOMY TUMOR  LEFT PTERIONAL;  Surgeon: Coletta Memos, MD;  Location: [facility] OR;  Service: Neurosurgery;  Laterality: N/A;   cyst removed from chest      as a child   ESOPHAGOGASTRODUODENOSCOPY (EGD) WITH PROPOFOL N/A 02/06/2021   Procedure: ESOPHAGOGASTRODUODENOSCOPY (EGD) WITH PROPOFOL;  Surgeon: Beverley Fiedler, MD;  Location: [facility] ENDOSCOPY;  Service: Gastroenterology;  Laterality: N/A;   INGUINAL HERNIA REPAIR N/A 07/01/2022   Procedure: HERNIA REPAIR INGUINAL INCARCERATED;  Surgeon: Karie Soda, MD;  Location: [facility] ORS;  Service: General;  Laterality: N/A;   IR FLUORO GUIDE CV LINE LEFT  12/22/2020   IR GASTROSTOMY TUBE MOD SED  02/10/2021   IR GASTROSTOMY TUBE REMOVAL  04/15/2021   IR REMOVAL TUN CV CATH W/O FL  03/09/2021   IR US GUIDE VASC ACCESS LEFT  12/22/2020   LYSIS OF ADHESION N/A 07/01/2022   Procedure: LYSIS OF ADHESION;  Surgeon: Karie Soda, MD;  Location: [facility] ORS;  Service: General;  Laterality: N/A;   NO PAST SURGERIES     PARTIAL COLECTOMY N/A  09/10/2020   Procedure: TRANSVERSE COLECTOMY;  Surgeon: Diamantina Monks, MD;  Location: [facility] OR;  Service: General;  Laterality: N/A;   POLYPECTOMY  09/05/2020   Procedure: POLYPECTOMY;  Surgeon: Hilarie Fredrickson, MD;  Location: [facility] ENDOSCOPY;  Service: Endoscopy;;   PORT-A-CATH REMOVAL N/A 10/08/2022   Procedure: REMOVAL PORT-A-CATH;  Surgeon: Diamantina Monks, MD;  Location: [facility] OR;  Service: General;  Laterality: N/A;  46   PORTACATH PLACEMENT Right 10/01/2020   Procedure: INSERTION PORT-A-CATH;  Surgeon: Diamantina Monks, MD;  Location: [facility] OR;  Service: General;  Laterality: Right;   SUBMUCOSAL TATTOO INJECTION  09/05/2020   Procedure: SUBMUCOSAL TATTOO INJECTION;  Surgeon: Hilarie Fredrickson, MD;  Location: [facility] ENDOSCOPY;  Service: Endoscopy;;   TRACHEOSTOMY TUBE PLACEMENT N/A 01/05/2021   Procedure: TRACHEOSTOMY;  Surgeon: Serena Colonel, MD;  Location[facility] OR;  Service: ENT;  Laterality: N/A;   XI ROBOTIC ASSISTED COLOSTOMY TAKEDOWN N/A 07/01/2022   Procedure: ROBOTIC OSTOMY TAKEDOWN WITH INTRACORPOREAL ANASTOMOSIS WITH FIREFLY;  Surgeon: Karie Soda, MD;  Location: [facility] ORS;  Service: General;  Laterality: N/A;    Allergies  Allergen Reactions   Morphine And Codeine Other (See Comments)    Throat swelling (can tolerate oxycodone/hydrocodone/hydromorphone)    Current Outpatient Medications  Medication Sig Dispense Refill   acetaminophen (TYLENOL) 500 MG tablet Take 2 tablets (1,000 mg total) by mouth every 6 (six) hours. 120 tablet 1   allopurinol (ZYLOPRIM) 100 MG tablet Take 100 mg by mouth at bedtime.     amLODipine (NORVASC) 10 MG tablet Take 10 mg by mouth in the morning.     atorvastatin (LIPITOR) 20 MG tablet Take 20 mg by mouth in the morning.     docusate sodium (COLACE) 100 MG capsule Take 1 capsule (100 mg total) by mouth 2 (two) times daily. 60 capsule 1   Emollient (UDDERLY SMOOTH EX) Apply 1 Application topically as needed (dry/irritated skin).     gabapentin (NEURONTIN) 100 MG capsule  Take 100-200 mg by mouth See admin instructions. Take 1 capsule (100 mg) by mouth in the morning & take 2 capsules (200 mg) by mouth in the evening.     ibuprofen (ADVIL) 600 MG tablet Take 1 tablet (600 mg total) by mouth 4 (four) times daily. 120 tablet 0   levETIRAcetam (KEPPRA) 500 MG tablet Take 500 mg by mouth 2 (two) times daily.     LORazepam (ATIVAN) 0.5 MG tablet Take 0.5 mg by mouth 2 (two) times daily as  needed for anxiety.     methocarbamol (ROBAXIN-750) 750 MG tablet Take 1 tablet (750 mg total) by mouth 4 (four) times daily. 120 tablet 0   metoCLOPramide (REGLAN) 5 MG tablet Take 1 tablet (5 mg total) by mouth every 6 (six) hours as needed for nausea, refractory nausea / vomiting or vomiting. 20 tablet 2   omeprazole (PRILOSEC) 20 MG capsule Take 20 mg by mouth in the morning and at bedtime.     oxyCODONE (ROXICODONE) 5 MG immediate release tablet Take 1 tablet (5 mg total) by mouth every 4 (four) hours as needed. 10 tablet 0   polyethylene glycol powder (GLYCOLAX/MIRALAX) 17 GM/SCOOP powder Take 17 g by mouth daily. (Patient taking differently: Take 17 g by mouth daily as needed for moderate constipation.) 238 g 0   senna (SENOKOT) 8.6 MG TABS tablet Take 1 tablet (8.6 mg total) by mouth daily. (Patient taking differently: Take 1 tablet by mouth daily as needed for moderate constipation.) 120 tablet 0   simethicone (MYLICON) 80 MG chewable tablet Chew 1 tablet (80 mg total) by mouth 4 (four) times daily as needed for flatulence (Bloating). 30 tablet 0   No current facility-administered medications for this visit.    Family History  Problem Relation Age of Onset   Breast cancer Mother    Breast cancer Maternal Aunt    Prostate cancer Maternal Uncle    Colon cancer Neg Hx    Esophageal cancer Neg Hx    Rectal cancer Neg Hx    Stomach cancer Neg Hx     Social History   Socioeconomic History   Marital status: Married    Spouse name: Mahalia   Number of children: 4   Years  of education: Not on file   Highest education level: Not on file  Occupational History   Occupation: Disabled  Tobacco Use   Smoking status: Former    Types: Cigarettes   Smokeless tobacco: Never   Tobacco comments:    quit smoking 35 yrs ago  Vaping Use   Vaping status: Never Used  Substance and Sexual Activity   Alcohol use: No   Drug use: No   Sexual activity: Not on file  Other Topics Concern   Not on file  Social History Narrative   Married to his wife, Solon Palm with total of #4 children (#2 with current wife). Lives in home with his son and daughter-in-law.     Social Determinants of Health   Financial Resource Strain: Not on file  Food Insecurity: No Food Insecurity (07/01/2022)   Hunger Vital Sign    Worried About Running Out of Food in the Last Year: Never true    Ran Out of Food in the Last Year: Never true  Transportation Needs: No Transportation Needs (07/01/2022)   PRAPARE - Administrator, Civil Service (Medical): No    Lack of Transportation (Non-Medical): No  Physical Activity: Not on file  Stress: Not on file  Social Connections: Unknown (09/21/2021)   Received from [facility], [facility]   Social Network    Social Network: Not on file  Intimate Partner Violence: Not At Risk (07/01/2022)   Humiliation, Afraid, Rape, and Kick questionnaire    Fear of Current or Ex-Partner: No    Emotionally Abused: No    Physically Abused: No    Sexually Abused: No     REVIEW OF SYSTEMS:   [X]  denotes positive finding, [ ]  denotes negative finding Cardiac  Comments:  Chest pain or chest pressure:    Shortness of breath upon exertion:    Short of breath when lying flat:    Irregular heart rhythm:        Vascular    Pain in calf, thigh, or hip brought on by ambulation:    Pain in feet at night that wakes you up from your sleep:     Blood clot in your veins:    Leg swelling:         Pulmonary    Oxygen at home:    Productive cough:      Wheezing:         Neurologic    Sudden weakness in arms or legs:     Sudden numbness in arms or legs:     Sudden onset of difficulty speaking or slurred speech:    Temporary loss of vision in one eye:     Problems with dizziness:         Gastrointestinal    Blood in stool:     Vomited blood:         Genitourinary    Burning when urinating:     Blood in urine:        Psychiatric    Major depression:         Hematologic    Bleeding problems:    Problems with blood clotting too easily:        Skin    Rashes or ulcers:        Constitutional    Fever or chills:      PHYSICAL EXAMINATION:  Today's Vitals   01/28/23 1442 01/28/23 1445  BP: (!) 149/78   Pulse: 71   Temp: 98 F (36.7 C)   TempSrc: Temporal   SpO2: 98%   Weight: 206 lb 6.4 oz (93.6 kg)   Height: 5\' 8"  (1.727 m)   PainSc: 2  2   PainLoc: Foot    Body mass index is 31.38 kg/m.   General:  WDWN in NAD; vital signs documented above Gait: Not observed HENT: WNL, normocephalic Pulmonary: normal non-labored breathing , without wheezing Cardiac: regular HR with murmur, without carotid bruits Abdomen: well healed laparotomy scar; visible hernia present Skin: without rashes Vascular Exam/Pulses:  Right Left  Radial 2+ (normal) 2+ (normal)  Popliteal Unable to palpate Unable to palpate  DP 1+ (weak) 1+ (weak)   Extremities: without ischemic changes, without Gangrene , without cellulitis; without open wounds; skin in good condition without any peeling.  Musculoskeletal: no muscle wasting or atrophy  Neurologic: A&O X 3 Psychiatric:  The pt has Normal affect.   Non-Invasive Vascular Imaging:   ABI's/TBI's on 01/21/2023: Right:  [place]/0.67 - Great toe pressure: 119 Left:  [place]/0.74 - Great toe pressure: 131  Previous ABI's/TBI's on 09/25/2021: Right:  [place]/0.60 - Great toe pressure: 89 Left:  [place]/0.50 - Great toe pressure:  75   ASSESSMENT/PLAN:: 67 y.o. male here for follow up for PAD here for follow  up.   -pt doing well without claudication, rest pain or non healing wounds.  He does have 1+ palpable DP pulses bilaterally.  His TBI and toe pressures have improved from his last visit.   -continue statin -encouraged him to stay active with walking.  -pt will f/u in one year with ABI.  He knows to call sooner if he has any issues before then.   Doreatha Massed, [facility] [phone]  Clinic MD:   Karin Lieu

## 2023-02-08 HISTORY — PX: EYE SURGERY: SHX253

## 2023-02-11 ENCOUNTER — Other Ambulatory Visit: Payer: Self-pay

## 2023-02-11 DIAGNOSIS — I739 Peripheral vascular disease, unspecified: Secondary | ICD-10-CM

## 2023-03-02 ENCOUNTER — Inpatient Hospital Stay: Payer: Medicare PPO | Attending: Student

## 2023-03-02 ENCOUNTER — Ambulatory Visit (HOSPITAL_BASED_OUTPATIENT_CLINIC_OR_DEPARTMENT_OTHER): Payer: Medicare PPO

## 2023-03-04 ENCOUNTER — Inpatient Hospital Stay: Payer: Medicare PPO | Admitting: Oncology

## 2023-04-04 ENCOUNTER — Ambulatory Visit (HOSPITAL_BASED_OUTPATIENT_CLINIC_OR_DEPARTMENT_OTHER): Payer: Medicare PPO | Attending: Oncology

## 2023-04-06 ENCOUNTER — Inpatient Hospital Stay: Payer: Medicare PPO | Attending: Student | Admitting: Oncology

## 2023-04-18 ENCOUNTER — Telehealth: Payer: Self-pay | Admitting: *Deleted

## 2023-04-18 NOTE — Telephone Encounter (Signed)
Called Thomas [name] to f/u on missing his CT scan and MD visit. He reports he has had multiple MD appointments over last few months and having hernia repair on 12/20 by Dr. Bedelia Person. His wife is not able to drive any longer as well. Wishes to delay his scan and f/u to early February.

## 2023-04-25 NOTE — Progress Notes (Signed)
Surgical Instructions   Your procedure is scheduled on Friday, April 29, 2023. Report to [facility] Main Entrance "A" at 11:30 A.M., then check in with the Admitting office. Any questions or running late day of surgery: call [phone]  Questions prior to your surgery date: call [phone], Monday-Friday, 8am-4pm. If you experience any cold or flu symptoms such as cough, fever, chills, shortness of breath, etc. between now and your scheduled surgery, please notify us at the above number.     Remember:  Do not eat after midnight the night before your surgery   You may drink clear liquids until 10:30 the morning of your surgery.   Clear liquids allowed are: Water, Non-Citrus Juices (without pulp), Carbonated Beverages, Clear Tea (no milk, honey, etc.), Black Coffee Only (NO MILK, CREAM OR POWDERED CREAMER of any kind), and Gatorade.    Take these medicines the morning of surgery with A SIP OF WATER   atorvastatin (LIPITOR)  gabapentin (NEURONTIN) levETIRAcetam (KEPPRA)   omeprazole (PRILOSEC)   May take these medicines IF NEEDED:  LORazepam (ATIVAN)   One week prior to surgery, STOP taking any Aspirin (unless otherwise instructed by your surgeon) Aleve, Naproxen, Ibuprofen, Motrin, Advil, Goody's, BC's, all herbal medications, fish oil, and non-prescription vitamins.                     Do NOT Smoke (Tobacco/Vaping) for 24 hours prior to your procedure.  If you use a CPAP at night, you may bring your mask/headgear for your overnight stay.   You will be asked to remove any contacts, glasses, piercing's, hearing aid's, dentures/partials prior to surgery. Please bring cases for these items if needed.    Patients discharged the day of surgery will not be allowed to drive home, and someone needs to stay with them for 24 hours.  SURGICAL WAITING ROOM VISITATION Patients may have no more than 2 support people in the waiting area - these visitors may rotate.   Pre-op nurse  will coordinate an appropriate time for 1 ADULT support person, who may not rotate, to accompany patient in pre-op.  Children under the age of 51 must have an adult with them who is not the patient and must remain in the main waiting area with an adult.  If the patient needs to stay at the hospital during part of their recovery, the visitor guidelines for inpatient rooms apply.  Please refer to the [facility] website for the visitor guidelines for any additional information.   If you received a COVID test during your pre-op visit  it is requested that you wear a mask when out in public, stay away from anyone that may not be feeling well and notify your surgeon if you develop symptoms. If you have been in contact with anyone that has tested positive in the last 10 days please notify you surgeon.      Pre-operative CHG Bathing Instructions   You can play a key role in reducing the risk of infection after surgery. Your skin needs to be as free of germs as possible. You can reduce the number of germs on your skin by washing with CHG (chlorhexidine gluconate) soap before surgery. CHG is an antiseptic soap that kills germs and continues to kill germs even after washing.   DO NOT use if you have an allergy to chlorhexidine/CHG or antibacterial soaps. If your skin becomes reddened or irritated, stop using the CHG and notify one of our RNs at [phone].  TAKE A SHOWER THE NIGHT BEFORE SURGERY AND THE DAY OF SURGERY    Please keep in mind the following:  DO NOT shave, including legs and underarms, 48 hours prior to surgery.   You may shave your face before/day of surgery.  Place clean sheets on your bed the night before surgery Use a clean washcloth (not used since being washed) for each shower. DO NOT sleep with pet's night before surgery.  CHG Shower Instructions:  Wash your face and private area with normal soap. If you choose to wash your hair, wash first with your normal  shampoo.  After you use shampoo/soap, rinse your hair and body thoroughly to remove shampoo/soap residue.  Turn the water OFF and apply half the bottle of CHG soap to a CLEAN washcloth.  Apply CHG soap ONLY FROM YOUR NECK DOWN TO YOUR TOES (washing for 3-5 minutes)  DO NOT use CHG soap on face, private areas, open wounds, or sores.  Pay special attention to the area where your surgery is being performed.  If you are having back surgery, having someone wash your back for you may be helpful. Wait 2 minutes after CHG soap is applied, then you may rinse off the CHG soap.  Pat dry with a clean towel  Put on clean pajamas    Additional instructions for the day of surgery: DO NOT APPLY any lotions, deodorants, cologne, or perfumes.   Do not wear jewelry or makeup Do not wear nail polish, gel polish, artificial nails, or any other type of covering on natural nails (fingers and toes) Do not bring valuables to the hospital. [facility] is not responsible for valuables/personal belongings. Put on clean/comfortable clothes.  Please brush your teeth.  Ask your nurse before applying any prescription medications to the skin.

## 2023-04-26 ENCOUNTER — Other Ambulatory Visit: Payer: Self-pay

## 2023-04-26 ENCOUNTER — Encounter (HOSPITAL_COMMUNITY): Payer: Self-pay

## 2023-04-26 ENCOUNTER — Encounter (HOSPITAL_COMMUNITY)
Admission: RE | Admit: 2023-04-26 | Discharge: 2023-04-26 | Disposition: A | Discharge status: Home / Self Care | Payer: Medicare PPO | Source: Ambulatory Visit | Attending: Surgery | Admitting: Surgery

## 2023-04-26 VITALS — BP 141/72 | HR 76 | Temp 98.4°F | Resp 17 | Ht 68.0 in | Wt 208.5 lb

## 2023-04-26 DIAGNOSIS — K219 Gastro-esophageal reflux disease without esophagitis: Secondary | ICD-10-CM | POA: Insufficient documentation

## 2023-04-26 DIAGNOSIS — E1121 Type 2 diabetes mellitus with diabetic nephropathy: Secondary | ICD-10-CM | POA: Insufficient documentation

## 2023-04-26 DIAGNOSIS — G4733 Obstructive sleep apnea (adult) (pediatric): Secondary | ICD-10-CM | POA: Insufficient documentation

## 2023-04-26 DIAGNOSIS — I10 Essential (primary) hypertension: Secondary | ICD-10-CM | POA: Insufficient documentation

## 2023-04-26 DIAGNOSIS — Z01818 Encounter for other preprocedural examination: Secondary | ICD-10-CM

## 2023-04-26 DIAGNOSIS — Z01812 Encounter for preprocedural laboratory examination: Secondary | ICD-10-CM | POA: Insufficient documentation

## 2023-04-26 LAB — CBC
HCT: 38.3 % — ABNORMAL LOW (ref 39.0–52.0)
Hemoglobin: 13.1 g/dL (ref 13.0–17.0)
MCH: 26.7 pg (ref 26.0–34.0)
MCHC: 34.2 g/dL (ref 30.0–36.0)
MCV: 78.2 fL — ABNORMAL LOW (ref 80.0–100.0)
Platelets: 280 K/uL (ref 150–400)
RBC: 4.9 MIL/uL (ref 4.22–5.81)
RDW: 14.6 % (ref 11.5–15.5)
WBC: 11.7 K/uL — ABNORMAL HIGH (ref 4.0–10.5)
nRBC: 0 % (ref 0.0–0.2)

## 2023-04-26 LAB — HEMOGLOBIN A1C
Hgb A1c MFr Bld: 5.6 % (ref 4.8–5.6)
Mean Plasma Glucose: 114.02 mg/dL

## 2023-04-26 LAB — BASIC METABOLIC PANEL WITH GFR
Anion gap: 6 (ref 5–15)
BUN: 31 mg/dL — ABNORMAL HIGH (ref 8–23)
CO2: 24 mmol/L (ref 22–32)
Calcium: 9 mg/dL (ref 8.9–10.3)
Chloride: 109 mmol/L (ref 98–111)
Creatinine, Ser: 1.81 mg/dL — ABNORMAL HIGH (ref 0.61–1.24)
GFR, Estimated: 41 mL/min — ABNORMAL LOW
Glucose, Bld: 84 mg/dL (ref 70–99)
Potassium: 4.4 mmol/L (ref 3.5–5.1)
Sodium: 139 mmol/L (ref 135–145)

## 2023-04-26 LAB — GLUCOSE, CAPILLARY: Glucose-Capillary: 94 mg/dL (ref 70–99)

## 2023-04-26 NOTE — Pre-Procedure Instructions (Signed)
Surgical Instructions     Your procedure is scheduled on Friday, April 29, 2023.  Report to [facility] Main Entrance "A" at 11:30 A.M., then check in with the Admitting office. Any questions or running late day of surgery: call [phone]   Questions prior to your surgery date: call [phone], Monday-Friday, 8am-4pm. If you experience any cold or flu symptoms such as cough, fever, chills, shortness of breath, etc. between now and your scheduled surgery, please notify us at the above number.            Remember:       Do not eat after midnight the night before your surgery     You may drink clear liquids until 10:30 the morning of your surgery.   Clear liquids allowed are: Water, Non-Citrus Juices (without pulp), Carbonated Beverages, Clear Tea (no milk, honey, etc.), Black Coffee Only (NO MILK, CREAM OR POWDERED CREAMER of any kind), and Gatorade.          Take these medicines the morning of surgery with A SIP OF WATER    atorvastatin (LIPITOR)  gabapentin (NEURONTIN) levETIRAcetam (KEPPRA)   omeprazole (PRILOSEC)    May take these medicines IF NEEDED:   LORazepam (ATIVAN)    One week prior to surgery, STOP taking any Aspirin (unless otherwise instructed by your surgeon) Aleve, Naproxen, Ibuprofen, Motrin, Advil, Goody's, BC's, all herbal medications, fish oil, and non-prescription vitamins.  HOW TO MANAGE YOUR DIABETES BEFORE AND AFTER SURGERY  Why is it important to control my blood sugar before and after surgery? Improving blood sugar levels before and after surgery helps healing and can limit problems. A way of improving blood sugar control is eating a healthy diet by:  Eating less sugar and carbohydrates  Increasing activity/exercise  Talking with your doctor about reaching your blood sugar goals High blood sugars (greater than 180 mg/dL) can raise your risk of infections and slow your recovery, so you will need to focus on controlling your diabetes during the  weeks before surgery. Make sure that the doctor who takes care of your diabetes knows about your planned surgery including the date and location.  How do I manage my blood sugar before surgery? Check your blood sugar at least 4 times a day, starting 2 days before surgery, to make sure that the level is not too high or low.  Check your blood sugar the morning of your surgery when you wake up and every 2 hours until you get to the Short Stay unit.  If your blood sugar is less than 70 mg/dL, you will need to treat for low blood sugar: Do not take insulin. Treat a low blood sugar (less than 70 mg/dL) with  cup of clear juice (cranberry or apple), 4 glucose tablets, OR glucose gel. Recheck blood sugar in 15 minutes after treatment (to make sure it is greater than 70 mg/dL). If your blood sugar is not greater than 70 mg/dL on recheck, call [phone] for further instructions. Report your blood sugar to the short stay nurse when you get to Short Stay.  If you are admitted to the hospital after surgery: Your blood sugar will be checked by the staff and you will probably be given insulin after surgery (instead of oral diabetes medicines) to make sure you have good blood sugar levels. The goal for blood sugar control after surgery is 80-180 mg/dL.  Do NOT Smoke (Tobacco/Vaping) for 24 hours prior to your procedure.   If you use a CPAP at night, you may bring your mask/headgear for your overnight stay.   You will be asked to remove any contacts, glasses, piercing's, hearing aid's, dentures/partials prior to surgery. Please bring cases for these items if needed.    Patients discharged the day of surgery will not be allowed to drive home, and someone needs to stay with them for 24 hours.   SURGICAL WAITING ROOM VISITATION Patients may have no more than 2 support people in the waiting area - these visitors may rotate.   Pre-op nurse will coordinate an appropriate time for 1  ADULT support person, who may not rotate, to accompany patient in pre-op.  Children under the age of 5 must have an adult with them who is not the patient and must remain in the main waiting area with an adult.   If the patient needs to stay at the hospital during part of their recovery, the visitor guidelines for inpatient rooms apply.   Please refer to the [facility] website for the visitor guidelines for any additional information.     If you received a COVID test during your pre-op visit  it is requested that you wear a mask when out in public, stay away from anyone that may not be feeling well and notify your surgeon if you develop symptoms. If you have been in contact with anyone that has tested positive in the last 10 days please notify you surgeon.         Pre-operative CHG Bathing Instructions    You can play a key role in reducing the risk of infection after surgery. Your skin needs to be as free of germs as possible. You can reduce the number of germs on your skin by washing with CHG (chlorhexidine gluconate) soap before surgery. CHG is an antiseptic soap that kills germs and continues to kill germs even after washing.    DO NOT use if you have an allergy to chlorhexidine/CHG or antibacterial soaps. If your skin becomes reddened or irritated, stop using the CHG and notify one of our RNs at [phone].               TAKE A SHOWER THE NIGHT BEFORE SURGERY AND THE DAY OF SURGERY     Please keep in mind the following:  DO NOT shave, including legs and underarms, 48 hours prior to surgery.   You may shave your face before/day of surgery.  Place clean sheets on your bed the night before surgery Use a clean washcloth (not used since being washed) for each shower. DO NOT sleep with pet's night before surgery.   CHG Shower Instructions:  Wash your face and private area with normal soap. If you choose to wash your hair, wash first with your normal shampoo.  After you use  shampoo/soap, rinse your hair and body thoroughly to remove shampoo/soap residue.  Turn the water OFF and apply half the bottle of CHG soap to a CLEAN washcloth.  Apply CHG soap ONLY FROM YOUR NECK DOWN TO YOUR TOES (washing for 3-5 minutes)  DO NOT use CHG soap on face, private areas, open wounds, or sores.  Pay special attention to the area where your surgery is being performed.  If you are having back surgery, having someone wash your back for you may be helpful. Wait 2 minutes after CHG soap is applied, then you may rinse off the CHG  soap.  Pat dry with a clean towel  Put on clean pajamas     Additional instructions for the day of surgery: DO NOT APPLY any lotions, deodorants, cologne, or perfumes.   Do not wear jewelry or makeup Do not wear nail polish, gel polish, artificial nails, or any other type of covering on natural nails (fingers and toes) Do not bring valuables to the hospital. [facility] is not responsible for valuables/personal belongings. Put on clean/comfortable clothes.  Please brush your teeth.  Ask your nurse before applying any prescription medications to the skin.

## 2023-04-26 NOTE — Progress Notes (Signed)
PCP - Hillery Aldo, NP Cardiologist - denies  PPM/ICD - denies Device Orders - n/a Rep Notified - n/a  Chest x-ray - denies EKG - 07/08/2022 Stress Test - per pt in 2010 ECHO - 07/16/2022 Cardiac Cath - 07/29/2008  Sleep Study - in 2017 per pt CPAP - denies  Fasting Blood Sugar - 90-100 Checks Blood Sugar - checks CBG "once in a while" per pt  Last dose of GLP1 agonist- n/a   GLP1 instructions: n/a  Blood Thinner Instructions: n/a Aspirin Instructions: n/a  ERAS Protcol - yes till 1030 AM  PRE-SURGERY Ensure or G2- no   COVID TEST- n/a   Anesthesia review: yes, cardiac studies. Antionette Poles, PA is aware that pt has heart murmur; per PA if pt is not having any chest pain, lightheadedness, syncope, etc, he should be able to proceed with surgery. However PCP needs to be aware that pt has aortic stenosis on that echo. It's something that will need followup monitoring every couple years and he may need to see cardiology. Pt verbalizes understanding. PCP's office is notified (this RN spoke to [place] who will direct the message to PCP.)    Patient denies shortness of breath, fever, cough and chest pain at PAT appointment   All instructions explained to the patient, with a verbal understanding of the material. Patient agrees to go over the instructions while at home for a better understanding. Patient also instructed to self quarantine after being tested for COVID-19. The opportunity to ask questions was provided.

## 2023-04-27 ENCOUNTER — Ambulatory Visit: Payer: Self-pay | Admitting: Surgery

## 2023-04-27 NOTE — Progress Notes (Signed)
Anesthesia Chart Review:  66 year old male with pertinent history including OSA not on CPAP, seizures, brain tumor s/p resection 2018, peripheral edema, non-insulin-dependent DM2, HTN, GERD, peripheral neuropathy, colon cancer.  He is s/p partial transverse colectomy and end colostomy 09/10/2020 followed by chemotherapy.  He had a prolonged admission July through November 2022 relating to capecitabine toxicity.  He required dialysis for acute renal failure.  He developed pneumonia and required intubation and ultimately required tracheostomy which was eventually decannulated 03/09/2021.  He also had an episode of A-fib with RVR during admission, echo showed normal LVEF with grade 1 DD.  Underwent colostomy takedown 06/2022 complicated by prolonged postop ileus, now with incisional hernia in need of repair.  Echo was done during admission following colostomy takedown in March 2024 for evaluation of murmur; showed LVEF 60 to 65%, grade 1 DD, normal RV systolic function, mild to moderate aortic stenosis with mean gradient 20 mmHg.  Reported history of difficult intubation, GlideScope was used electively for ostomy takedown 06/29/2022.  Preop labs reviewed, creatinine elevated 1.81 (stable compared with labs from 10/08/2022), otherwise unremarkable.  EKG 07/08/2022: Normal sinus rhythm.  Rate 86. Nonspecific T wave abnormality. Prolonged QT (QTcB 473)  TTE 07/16/2022:  1. Left ventricular ejection fraction, by estimation, is 60 to 65%. The  left ventricle has normal function. The left ventricle has no regional  wall motion abnormalities. There is mild concentric left ventricular  hypertrophy. Left ventricular diastolic  parameters are consistent with Grade I diastolic dysfunction (impaired  relaxation).   2. Right ventricular systolic function is normal. The right ventricular  size is normal. Tricuspid regurgitation signal is inadequate for assessing  PA pressure.   3. The mitral valve is normal in  structure. No evidence of mitral valve  regurgitation. No evidence of mitral stenosis.   4. The aortic valve is bicuspid. There is moderate calcification of the  aortic valve. Aortic valve regurgitation is mild. Mild to moderate aortic  valve stenosis. Aortic valve mean gradient measures 20.0 mmHg, AVA 1.65  cm^2.   5. IVC was not visualized.      [facility]annie [facility] Short Stay Center/Anesthesiology Phone [phone] 04/27/2023 2:05 PM

## 2023-04-27 NOTE — Anesthesia Preprocedure Evaluation (Addendum)
Anesthesia Evaluation  Patient identified by MRN, date of birth, ID band Patient awake    Reviewed: Allergy & Precautions, NPO status , Patient's Chart, lab work & pertinent test results  History of Anesthesia Complications Negative for: history of anesthetic complications  Airway Mallampati: II  TM Distance: >3 FB Neck ROM: Full    Dental  (+) Dental Advisory Given, Poor Dentition, Chipped, Missing   Pulmonary sleep apnea (does not use CPAP) , former smoker   breath sounds clear to auscultation       Cardiovascular hypertension, Pt. on medications (-) angina + Valvular Problems/Murmurs AS  Rhythm:Regular Rate:Normal + Systolic murmurs 05/6107 ECHO: EF 60-65%, normal LVF, mild LVH, Grade 1 DDm normal RVF, mild-mod AS   Neuro/Psych  Headaches  Anxiety Depression    Frontal meningioma CVA (no residual)    GI/Hepatic Neg liver ROS,GERD  Medicated and Controlled,,H/o colon cancer   Endo/Other  diabetes (glu 102)  BMI 31  Renal/GU Renal InsufficiencyRenal disease     Musculoskeletal   Abdominal   Peds  Hematology Hb 13.1, plt 280k   Anesthesia Other Findings   Reproductive/Obstetrics                             Anesthesia Physical Anesthesia Plan  ASA: 3  Anesthesia Plan: General   Post-op Pain Management: Tylenol PO (pre-op)*   Induction: Intravenous  PONV Risk Score and Plan: 2 and Ondansetron and Dexamethasone  Airway Management Planned: Oral ETT  Additional Equipment: None  Intra-op Plan:   Post-operative Plan: Extubation in OR  Informed Consent: I have reviewed the patients History and Physical, chart, labs and discussed the procedure including the risks, benefits and alternatives for the proposed anesthesia with the patient or authorized representative who has indicated his/her understanding and acceptance.     Dental advisory given  Plan Discussed with: CRNA and  Surgeon  Anesthesia Plan Comments: (PAT note by Antionette Poles, PA-C:  67 year old male with pertinent history including OSA not on CPAP, seizures, brain tumor s/p resection 2018, peripheral edema, non-insulin-dependent DM2, HTN, GERD, peripheral neuropathy, colon cancer.  He is s/p partial transverse colectomy and end colostomy 09/10/2020 followed by chemotherapy.  He had a prolonged admission July through November 2022 relating to capecitabine toxicity.  He required dialysis for acute renal failure.  He developed pneumonia and required intubation and ultimately required tracheostomy which was eventually decannulated 03/09/2021.  He also had an episode of A-fib with RVR during admission, echo showed normal LVEF with grade 1 DD.  Underwent colostomy takedown 06/2022 complicated by prolonged postop ileus, now with incisional hernia in need of repair.  Echo was done during admission following colostomy takedown in March 2024 for evaluation of murmur; showed LVEF 60 to 65%, grade 1 DD, normal RV systolic function, mild to moderate aortic stenosis with mean gradient 20 mmHg.  Reported history of difficult intubation, GlideScope was used electively for ostomy takedown 06/29/2022.  Preop labs reviewed, creatinine elevated 1.81 (stable compared with labs from 10/08/2022), otherwise unremarkable.  EKG 07/08/2022: Normal sinus rhythm.  Rate 86. Nonspecific T wave abnormality. Prolonged QT (QTcB 473)  TTE 07/16/2022: 1. Left ventricular ejection fraction, by estimation, is 60 to 65%. The  left ventricle has normal function. The left ventricle has no regional  wall motion abnormalities. There is mild concentric left ventricular  hypertrophy. Left ventricular diastolic  parameters are consistent with Grade I diastolic dysfunction (impaired  relaxation).  2. Right ventricular  systolic function is normal. The right ventricular  size is normal. Tricuspid regurgitation signal is inadequate for assessing  PA  pressure.  3. The mitral valve is normal in structure. No evidence of mitral valve  regurgitation. No evidence of mitral stenosis.  4. The aortic valve is bicuspid. There is moderate calcification of the  aortic valve. Aortic valve regurgitation is mild. Mild to moderate aortic  valve stenosis. Aortic valve mean gradient measures 20.0 mmHg, AVA 1.65  cm^2.  5. IVC was not visualized.    )        Anesthesia Quick Evaluation

## 2023-04-29 ENCOUNTER — Ambulatory Visit (HOSPITAL_COMMUNITY): Payer: Medicare PPO | Admitting: Physician Assistant

## 2023-04-29 ENCOUNTER — Ambulatory Visit (HOSPITAL_COMMUNITY): Payer: Medicare PPO | Admitting: Anesthesiology

## 2023-04-29 ENCOUNTER — Encounter (HOSPITAL_COMMUNITY): Payer: Self-pay | Admitting: Surgery

## 2023-04-29 ENCOUNTER — Encounter (HOSPITAL_COMMUNITY)
Admission: RE | Disposition: A | Discharge status: Home / Self Care | Payer: Self-pay | Source: Home / Self Care | Attending: Surgery

## 2023-04-29 ENCOUNTER — Other Ambulatory Visit: Payer: Self-pay

## 2023-04-29 ENCOUNTER — Inpatient Hospital Stay (HOSPITAL_COMMUNITY)
Admission: RE | Admit: 2023-04-29 | Discharge: 2023-05-18 | DRG: 336 | Disposition: A | Discharge status: Home / Self Care | Payer: Medicare PPO | Attending: Surgery | Admitting: Surgery

## 2023-04-29 DIAGNOSIS — Z9889 Other specified postprocedural states: Secondary | ICD-10-CM

## 2023-04-29 DIAGNOSIS — K565 Intestinal adhesions [bands], unspecified as to partial versus complete obstruction: Secondary | ICD-10-CM | POA: Diagnosis present

## 2023-04-29 DIAGNOSIS — Z8701 Personal history of pneumonia (recurrent): Secondary | ICD-10-CM

## 2023-04-29 DIAGNOSIS — N1832 Chronic kidney disease, stage 3b: Secondary | ICD-10-CM | POA: Diagnosis present

## 2023-04-29 DIAGNOSIS — M109 Gout, unspecified: Secondary | ICD-10-CM | POA: Diagnosis present

## 2023-04-29 DIAGNOSIS — G4733 Obstructive sleep apnea (adult) (pediatric): Secondary | ICD-10-CM | POA: Diagnosis present

## 2023-04-29 DIAGNOSIS — E1121 Type 2 diabetes mellitus with diabetic nephropathy: Secondary | ICD-10-CM

## 2023-04-29 DIAGNOSIS — E1140 Type 2 diabetes mellitus with diabetic neuropathy, unspecified: Secondary | ICD-10-CM | POA: Diagnosis present

## 2023-04-29 DIAGNOSIS — K769 Liver disease, unspecified: Secondary | ICD-10-CM | POA: Diagnosis present

## 2023-04-29 DIAGNOSIS — K567 Ileus, unspecified: Secondary | ICD-10-CM | POA: Diagnosis not present

## 2023-04-29 DIAGNOSIS — Z515 Encounter for palliative care: Secondary | ICD-10-CM

## 2023-04-29 DIAGNOSIS — G40909 Epilepsy, unspecified, not intractable, without status epilepticus: Secondary | ICD-10-CM | POA: Diagnosis present

## 2023-04-29 DIAGNOSIS — K219 Gastro-esophageal reflux disease without esophagitis: Secondary | ICD-10-CM | POA: Diagnosis present

## 2023-04-29 DIAGNOSIS — I350 Nonrheumatic aortic (valve) stenosis: Secondary | ICD-10-CM | POA: Diagnosis present

## 2023-04-29 DIAGNOSIS — Y838 Other surgical procedures as the cause of abnormal reaction of the patient, or of later complication, without mention of misadventure at the time of the procedure: Secondary | ICD-10-CM | POA: Diagnosis not present

## 2023-04-29 DIAGNOSIS — Z8709 Personal history of other diseases of the respiratory system: Secondary | ICD-10-CM

## 2023-04-29 DIAGNOSIS — Z85038 Personal history of other malignant neoplasm of large intestine: Secondary | ICD-10-CM

## 2023-04-29 DIAGNOSIS — Z538 Procedure and treatment not carried out for other reasons: Secondary | ICD-10-CM | POA: Diagnosis not present

## 2023-04-29 DIAGNOSIS — Z87448 Personal history of other diseases of urinary system: Secondary | ICD-10-CM

## 2023-04-29 DIAGNOSIS — E1122 Type 2 diabetes mellitus with diabetic chronic kidney disease: Secondary | ICD-10-CM | POA: Diagnosis present

## 2023-04-29 DIAGNOSIS — Z860101 Personal history of adenomatous and serrated colon polyps: Secondary | ICD-10-CM

## 2023-04-29 DIAGNOSIS — S36408A Unspecified injury of other part of small intestine, initial encounter: Secondary | ICD-10-CM | POA: Diagnosis not present

## 2023-04-29 DIAGNOSIS — K432 Incisional hernia without obstruction or gangrene: Secondary | ICD-10-CM

## 2023-04-29 DIAGNOSIS — E1142 Type 2 diabetes mellitus with diabetic polyneuropathy: Secondary | ICD-10-CM | POA: Diagnosis present

## 2023-04-29 DIAGNOSIS — Z87891 Personal history of nicotine dependence: Secondary | ICD-10-CM

## 2023-04-29 DIAGNOSIS — Z9221 Personal history of antineoplastic chemotherapy: Secondary | ICD-10-CM

## 2023-04-29 DIAGNOSIS — Z8042 Family history of malignant neoplasm of prostate: Secondary | ICD-10-CM

## 2023-04-29 DIAGNOSIS — Z803 Family history of malignant neoplasm of breast: Secondary | ICD-10-CM

## 2023-04-29 DIAGNOSIS — F32A Depression, unspecified: Secondary | ICD-10-CM | POA: Diagnosis present

## 2023-04-29 DIAGNOSIS — Z8673 Personal history of transient ischemic attack (TIA), and cerebral infarction without residual deficits: Secondary | ICD-10-CM

## 2023-04-29 DIAGNOSIS — Z7189 Other specified counseling: Secondary | ICD-10-CM | POA: Diagnosis not present

## 2023-04-29 DIAGNOSIS — Z8719 Personal history of other diseases of the digestive system: Secondary | ICD-10-CM

## 2023-04-29 DIAGNOSIS — N179 Acute kidney failure, unspecified: Secondary | ICD-10-CM | POA: Diagnosis not present

## 2023-04-29 DIAGNOSIS — Z86011 Personal history of benign neoplasm of the brain: Secondary | ICD-10-CM

## 2023-04-29 DIAGNOSIS — E785 Hyperlipidemia, unspecified: Secondary | ICD-10-CM | POA: Diagnosis present

## 2023-04-29 DIAGNOSIS — R011 Cardiac murmur, unspecified: Secondary | ICD-10-CM | POA: Diagnosis present

## 2023-04-29 DIAGNOSIS — E875 Hyperkalemia: Secondary | ICD-10-CM | POA: Diagnosis not present

## 2023-04-29 DIAGNOSIS — Z86718 Personal history of other venous thrombosis and embolism: Secondary | ICD-10-CM

## 2023-04-29 DIAGNOSIS — C184 Malignant neoplasm of transverse colon: Secondary | ICD-10-CM | POA: Diagnosis not present

## 2023-04-29 DIAGNOSIS — I129 Hypertensive chronic kidney disease with stage 1 through stage 4 chronic kidney disease, or unspecified chronic kidney disease: Secondary | ICD-10-CM | POA: Diagnosis present

## 2023-04-29 DIAGNOSIS — Y733 Surgical instruments, materials and gastroenterology and urology devices (including sutures) associated with adverse incidents: Secondary | ICD-10-CM | POA: Diagnosis not present

## 2023-04-29 DIAGNOSIS — K56609 Unspecified intestinal obstruction, unspecified as to partial versus complete obstruction: Secondary | ICD-10-CM

## 2023-04-29 DIAGNOSIS — Z885 Allergy status to narcotic agent status: Secondary | ICD-10-CM

## 2023-04-29 DIAGNOSIS — F419 Anxiety disorder, unspecified: Secondary | ICD-10-CM | POA: Diagnosis present

## 2023-04-29 DIAGNOSIS — Z8711 Personal history of peptic ulcer disease: Secondary | ICD-10-CM

## 2023-04-29 DIAGNOSIS — Z9049 Acquired absence of other specified parts of digestive tract: Secondary | ICD-10-CM

## 2023-04-29 DIAGNOSIS — Z79899 Other long term (current) drug therapy: Secondary | ICD-10-CM

## 2023-04-29 DIAGNOSIS — C189 Malignant neoplasm of colon, unspecified: Secondary | ICD-10-CM | POA: Diagnosis not present

## 2023-04-29 DIAGNOSIS — Y92234 Operating room of hospital as the place of occurrence of the external cause: Secondary | ICD-10-CM | POA: Diagnosis not present

## 2023-04-29 DIAGNOSIS — Z01812 Encounter for preprocedural laboratory examination: Secondary | ICD-10-CM

## 2023-04-29 DIAGNOSIS — K0889 Other specified disorders of teeth and supporting structures: Secondary | ICD-10-CM | POA: Diagnosis present

## 2023-04-29 HISTORY — PX: LAPAROSCOPY: SHX197

## 2023-04-29 HISTORY — PX: VENTRAL HERNIA REPAIR: SHX424

## 2023-04-29 LAB — GLUCOSE, CAPILLARY
Glucose-Capillary: 102 mg/dL — ABNORMAL HIGH (ref 70–99)
Glucose-Capillary: 107 mg/dL — ABNORMAL HIGH (ref 70–99)

## 2023-04-29 SURGERY — REPAIR, HERNIA, VENTRAL
Anesthesia: General

## 2023-04-29 MED ORDER — BUPIVACAINE LIPOSOME 1.3 % IJ SUSP
INTRAMUSCULAR | Status: AC
  Filled 2023-04-29: qty 20

## 2023-04-29 MED ORDER — METHOCARBAMOL 500 MG PO TABS
1000.0000 mg | ORAL_TABLET | Freq: Three times a day (TID) | ORAL | Status: DC
  Administered 2023-04-29 – 2023-05-02 (×10): 1000 mg via ORAL
  Filled 2023-04-29 (×10): qty 2

## 2023-04-29 MED ORDER — OXYCODONE HCL 5 MG/5ML PO SOLN: 5.0000 mg | Freq: Once | ORAL | Status: DC | PRN

## 2023-04-29 MED ORDER — ONDANSETRON 4 MG PO TBDP
4.0000 mg | ORAL_TABLET | Freq: Four times a day (QID) | ORAL | Status: DC | PRN
  Administered 2023-05-02 – 2023-05-07 (×4): 4 mg via ORAL
  Filled 2023-04-29 (×4): qty 1

## 2023-04-29 MED ORDER — DOCUSATE SODIUM 100 MG PO CAPS
100.0000 mg | ORAL_CAPSULE | Freq: Two times a day (BID) | ORAL | Status: DC
  Administered 2023-04-29 – 2023-05-18 (×33): 100 mg via ORAL
  Filled 2023-04-29 (×37): qty 1

## 2023-04-29 MED ORDER — HYDROMORPHONE HCL 1 MG/ML IJ SOLN
INTRAMUSCULAR | Status: AC
  Filled 2023-04-29: qty 1

## 2023-04-29 MED ORDER — CHLORHEXIDINE GLUCONATE 0.12 % MT SOLN: 15.0000 mL | Freq: Once | OROMUCOSAL | Status: AC

## 2023-04-29 MED ORDER — LIDOCAINE 2% (20 MG/ML) 5 ML SYRINGE
INTRAMUSCULAR | Status: AC
  Filled 2023-04-29: qty 5

## 2023-04-29 MED ORDER — MIDAZOLAM HCL 2 MG/2ML IJ SOLN
0.5000 mg | Freq: Once | INTRAMUSCULAR | Status: DC | PRN
Start: 2023-04-29 — End: 2023-04-29

## 2023-04-29 MED ORDER — SUGAMMADEX SODIUM 200 MG/2ML IV SOLN
INTRAVENOUS | Status: DC | PRN
  Administered 2023-04-29: 200 mg via INTRAVENOUS

## 2023-04-29 MED ORDER — BUPIVACAINE LIPOSOME 1.3 % IJ SUSP: INTRAMUSCULAR | Status: DC | PRN

## 2023-04-29 MED ORDER — ROCURONIUM BROMIDE 10 MG/ML (PF) SYRINGE
PREFILLED_SYRINGE | INTRAVENOUS | Status: DC | PRN
  Administered 2023-04-29: 20 mg via INTRAVENOUS
  Administered 2023-04-29: 60 mg via INTRAVENOUS

## 2023-04-29 MED ORDER — ENOXAPARIN SODIUM 40 MG/0.4ML IJ SOSY: 40.0000 mg | PREFILLED_SYRINGE | Freq: Once | INTRAMUSCULAR | Status: AC

## 2023-04-29 MED ORDER — METOPROLOL TARTRATE 5 MG/5ML IV SOLN
5.0000 mg | Freq: Once | INTRAVENOUS | Status: AC
  Administered 2023-04-29: 5 mg via INTRAVENOUS
  Filled 2023-04-29: qty 5

## 2023-04-29 MED ORDER — BUPIVACAINE HCL (PF) 0.25 % IJ SOLN
INTRAMUSCULAR | Status: AC
  Filled 2023-04-29: qty 30

## 2023-04-29 MED ORDER — ACETAMINOPHEN 10 MG/ML IV SOLN
1000.0000 mg | Freq: Four times a day (QID) | INTRAVENOUS | Status: AC
  Administered 2023-04-29 – 2023-04-30 (×4): 1000 mg via INTRAVENOUS
  Filled 2023-04-29 (×4): qty 100

## 2023-04-29 MED ORDER — CHLORHEXIDINE GLUCONATE 0.12 % MT SOLN
OROMUCOSAL | Status: AC
  Administered 2023-04-29: 15 mL via OROMUCOSAL
  Filled 2023-04-29: qty 15

## 2023-04-29 MED ORDER — ACETAMINOPHEN 500 MG PO TABS: 1000.0000 mg | ORAL_TABLET | Freq: Once | ORAL | Status: DC

## 2023-04-29 MED ORDER — FENTANYL CITRATE (PF) 250 MCG/5ML IJ SOLN
INTRAMUSCULAR | Status: DC | PRN
  Administered 2023-04-29 (×5): 50 ug via INTRAVENOUS
  Administered 2023-04-29: 150 ug via INTRAVENOUS
  Administered 2023-04-29: 50 ug via INTRAVENOUS

## 2023-04-29 MED ORDER — ONDANSETRON HCL 4 MG/2ML IJ SOLN
4.0000 mg | Freq: Four times a day (QID) | INTRAMUSCULAR | Status: DC | PRN
  Administered 2023-05-04 – 2023-05-12 (×8): 4 mg via INTRAVENOUS
  Filled 2023-04-29 (×9): qty 2

## 2023-04-29 MED ORDER — ONDANSETRON HCL 4 MG/2ML IJ SOLN
INTRAMUSCULAR | Status: DC | PRN
  Administered 2023-04-29: 4 mg via INTRAVENOUS

## 2023-04-29 MED ORDER — OXYCODONE HCL 5 MG PO TABS
5.0000 mg | ORAL_TABLET | ORAL | Status: DC | PRN
  Administered 2023-04-29: 5 mg via ORAL
  Administered 2023-04-30 – 2023-05-02 (×9): 10 mg via ORAL
  Filled 2023-04-29 (×2): qty 2
  Filled 2023-04-29: qty 1
  Filled 2023-04-29 (×8): qty 2

## 2023-04-29 MED ORDER — DEXAMETHASONE SODIUM PHOSPHATE 10 MG/ML IJ SOLN
INTRAMUSCULAR | Status: DC | PRN
  Administered 2023-04-29: 5 mg via INTRAVENOUS

## 2023-04-29 MED ORDER — HYDROMORPHONE HCL 1 MG/ML IJ SOLN: 0.5000 mg | INTRAMUSCULAR | Status: DC | PRN

## 2023-04-29 MED ORDER — EPHEDRINE SULFATE-NACL 50-0.9 MG/10ML-% IV SOSY
PREFILLED_SYRINGE | INTRAVENOUS | Status: DC | PRN
  Administered 2023-04-29 (×3): 5 mg via INTRAVENOUS

## 2023-04-29 MED ORDER — CEFAZOLIN SODIUM-DEXTROSE 2-4 GM/100ML-% IV SOLN
INTRAVENOUS | Status: AC
  Filled 2023-04-29: qty 100

## 2023-04-29 MED ORDER — CHLORHEXIDINE GLUCONATE CLOTH 2 % EX PADS: 6.0000 | MEDICATED_PAD | Freq: Once | CUTANEOUS | Status: DC

## 2023-04-29 MED ORDER — ENOXAPARIN SODIUM 40 MG/0.4ML IJ SOSY
PREFILLED_SYRINGE | INTRAMUSCULAR | Status: AC
  Administered 2023-04-29: 40 mg via SUBCUTANEOUS
  Filled 2023-04-29: qty 0.4

## 2023-04-29 MED ORDER — FENTANYL CITRATE (PF) 250 MCG/5ML IJ SOLN
INTRAMUSCULAR | Status: AC
  Filled 2023-04-29: qty 5

## 2023-04-29 MED ORDER — MIDAZOLAM HCL 2 MG/2ML IJ SOLN
INTRAMUSCULAR | Status: AC
Start: 2023-04-29 — End: ?
  Filled 2023-04-29: qty 2

## 2023-04-29 MED ORDER — 0.9 % SODIUM CHLORIDE (POUR BTL) OPTIME
TOPICAL | Status: DC | PRN
  Administered 2023-04-29: 1000 mL

## 2023-04-29 MED ORDER — HYDROMORPHONE HCL 1 MG/ML IJ SOLN
0.5000 mg | INTRAMUSCULAR | Status: DC | PRN
  Administered 2023-04-29 – 2023-05-05 (×9): 0.5 mg via INTRAVENOUS
  Filled 2023-04-29 (×9): qty 0.5

## 2023-04-29 MED ORDER — LACTATED RINGERS IV SOLN: INTRAVENOUS | Status: AC

## 2023-04-29 MED ORDER — ROCURONIUM BROMIDE 10 MG/ML (PF) SYRINGE
PREFILLED_SYRINGE | INTRAVENOUS | Status: AC
  Filled 2023-04-29: qty 10

## 2023-04-29 MED ORDER — ACETAMINOPHEN 500 MG PO TABS
ORAL_TABLET | ORAL | Status: AC
  Administered 2023-04-29: 1000 mg via ORAL
  Filled 2023-04-29: qty 2

## 2023-04-29 MED ORDER — LACTATED RINGERS IV SOLN: INTRAVENOUS | Status: DC

## 2023-04-29 MED ORDER — CEFAZOLIN SODIUM-DEXTROSE 2-4 GM/100ML-% IV SOLN
2.0000 g | INTRAVENOUS | Status: AC
Start: 2023-04-29 — End: 2023-04-29
  Administered 2023-04-29: 2 g via INTRAVENOUS

## 2023-04-29 MED ORDER — ONDANSETRON HCL 4 MG/2ML IJ SOLN
INTRAMUSCULAR | Status: AC
  Filled 2023-04-29: qty 2

## 2023-04-29 MED ORDER — PROPOFOL 10 MG/ML IV BOLUS
INTRAVENOUS | Status: AC
  Filled 2023-04-29: qty 20

## 2023-04-29 MED ORDER — SODIUM CHLORIDE 0.9 % IV SOLN: INTRAVENOUS | Status: DC | PRN

## 2023-04-29 MED ORDER — ACETAMINOPHEN 500 MG PO TABS: 1000.0000 mg | ORAL_TABLET | ORAL | Status: AC

## 2023-04-29 MED ORDER — ACETAMINOPHEN 500 MG PO TABS
1000.0000 mg | ORAL_TABLET | Freq: Four times a day (QID) | ORAL | Status: DC
  Administered 2023-04-30 – 2023-05-18 (×64): 1000 mg via ORAL
  Filled 2023-04-29 (×67): qty 2

## 2023-04-29 MED ORDER — DEXAMETHASONE SODIUM PHOSPHATE 10 MG/ML IJ SOLN
INTRAMUSCULAR | Status: AC
  Filled 2023-04-29: qty 1

## 2023-04-29 MED ORDER — ORAL CARE MOUTH RINSE
15.0000 mL | Freq: Once | OROMUCOSAL | Status: AC
Start: 2023-04-29 — End: 2023-04-29

## 2023-04-29 MED ORDER — LIDOCAINE 2% (20 MG/ML) 5 ML SYRINGE
INTRAMUSCULAR | Status: DC | PRN
  Administered 2023-04-29: 40 mg via INTRAVENOUS

## 2023-04-29 MED ORDER — OXYCODONE HCL 5 MG PO TABS: 5.0000 mg | ORAL_TABLET | Freq: Once | ORAL | Status: DC | PRN

## 2023-04-29 MED ORDER — HYDROMORPHONE HCL 1 MG/ML IJ SOLN
0.2500 mg | INTRAMUSCULAR | Status: DC | PRN
Start: 2023-04-29 — End: 2023-04-29
  Administered 2023-04-29 (×2): 0.25 mg via INTRAVENOUS

## 2023-04-29 MED ORDER — ENOXAPARIN SODIUM 40 MG/0.4ML IJ SOSY
40.0000 mg | PREFILLED_SYRINGE | INTRAMUSCULAR | Status: DC
  Administered 2023-04-30 – 2023-05-04 (×5): 40 mg via SUBCUTANEOUS
  Filled 2023-04-29 (×5): qty 0.4

## 2023-04-29 MED ORDER — PROPOFOL 10 MG/ML IV BOLUS
INTRAVENOUS | Status: DC | PRN
  Administered 2023-04-29: 80 mg via INTRAVENOUS

## 2023-04-29 SURGICAL SUPPLY — 49 items
BAG COUNTER SPONGE SURGICOUNT (BAG) ×3 IMPLANT
BINDER ABDOMINAL 12 ML 46-62 (SOFTGOODS) IMPLANT
BIOPATCH RED 1 DISK 7.0 (GAUZE/BANDAGES/DRESSINGS) IMPLANT
CANISTER SUCT 3000ML PPV (MISCELLANEOUS) IMPLANT
CHLORAPREP W/TINT 26 (MISCELLANEOUS) ×3 IMPLANT
COVER SURGICAL LIGHT HANDLE (MISCELLANEOUS) ×3 IMPLANT
DERMABOND ADVANCED .7 DNX12 (GAUZE/BANDAGES/DRESSINGS) ×3 IMPLANT
DEVICE SECURE STRAP 25 ABSORB (INSTRUMENTS) IMPLANT
DRAPE INCISE IOBAN 66X45 STRL (DRAPES) ×3 IMPLANT
DRSG OPSITE POSTOP 4X6 (GAUZE/BANDAGES/DRESSINGS) IMPLANT
DRSG TEGADERM 4X4.75 (GAUZE/BANDAGES/DRESSINGS) IMPLANT
ELECT CAUTERY BLADE 6.4 (BLADE) IMPLANT
ELECT REM PT RETURN 9FT ADLT (ELECTROSURGICAL) ×2
ELECTRODE REM PT RTRN 9FT ADLT (ELECTROSURGICAL) ×3 IMPLANT
GLOVE BIO SURGEON STRL SZ 6.5 (GLOVE) ×3 IMPLANT
GLOVE BIOGEL PI IND STRL 6 (GLOVE) ×3 IMPLANT
GOWN STRL REUS W/ TWL LRG LVL3 (GOWN DISPOSABLE) ×9 IMPLANT
GRASPER SUT TROCAR 14GX15 (MISCELLANEOUS) ×3 IMPLANT
IRRIG SUCT STRYKERFLOW 2 WTIP (MISCELLANEOUS)
IRRIGATION SUCT STRKRFLW 2 WTP (MISCELLANEOUS) IMPLANT
KIT BASIN OR (CUSTOM PROCEDURE TRAY) ×3 IMPLANT
KIT TURNOVER KIT B (KITS) ×3 IMPLANT
MARKER SKIN DUAL TIP RULER LAB (MISCELLANEOUS) ×3 IMPLANT
MESH PHASIX ST 11CM (Mesh General) IMPLANT
NDL INSUFFLATION 14GA 120MM (NEEDLE) ×3 IMPLANT
NDL SPNL 22GX3.5 QUINCKE BK (NEEDLE) ×3 IMPLANT
NEEDLE INSUFFLATION 14GA 120MM (NEEDLE) ×2
NEEDLE SPNL 22GX3.5 QUINCKE BK (NEEDLE) ×2
NS IRRIG 1000ML POUR BTL (IV SOLUTION) ×3 IMPLANT
PAD ARMBOARD 7.5X6 YLW CONV (MISCELLANEOUS) ×6 IMPLANT
PENCIL BUTTON HOLSTER BLD 10FT (ELECTRODE) IMPLANT
SCISSORS LAP 5X35 DISP (ENDOMECHANICALS) IMPLANT
SET TUBE SMOKE EVAC HIGH FLOW (TUBING) ×3 IMPLANT
SHEARS HARMONIC ACE PLUS 36CM (ENDOMECHANICALS) IMPLANT
SLEEVE Z-THREAD 5X100MM (TROCAR) ×6 IMPLANT
SUT ETHIBOND 0 MO6 C/R (SUTURE) ×3 IMPLANT
SUT MNCRL AB 4-0 PS2 18 (SUTURE) ×3 IMPLANT
SUT NOVA 1 T20/GS 25DT (SUTURE) ×3 IMPLANT
SUT VIC AB 0 UR5 27 (SUTURE) IMPLANT
SUT VIC AB 3-0 SH 18 (SUTURE) IMPLANT
SUT VIC AB 3-0 SH 27XBRD (SUTURE) IMPLANT
TOWEL GREEN STERILE (TOWEL DISPOSABLE) ×3 IMPLANT
TOWEL GREEN STERILE FF (TOWEL DISPOSABLE) ×3 IMPLANT
TRAY FOLEY W/BAG SLVR 16FR ST (SET/KITS/TRAYS/PACK) IMPLANT
TRAY LAPAROSCOPIC MC (CUSTOM PROCEDURE TRAY) ×3 IMPLANT
TROCAR 11X100 Z THREAD (TROCAR) IMPLANT
TROCAR Z-THREAD OPTICAL 5X100M (TROCAR) ×3 IMPLANT
WARMER LAPAROSCOPE (MISCELLANEOUS) ×3 IMPLANT
WATER STERILE IRR 1000ML POUR (IV SOLUTION) ×3 IMPLANT

## 2023-04-29 NOTE — Anesthesia Postprocedure Evaluation (Signed)
Anesthesia Post Note  Patient: Thomas [name]  Procedure(s) Performed: VENTRAL HERNIA REPAIR WITH MESH LAPAROSCOPY DIAGNOSTIC     Patient location during evaluation: PACU Anesthesia Type: General Level of consciousness: awake and alert, patient cooperative and oriented Pain management: pain level controlled Vital Signs Assessment: post-procedure vital signs reviewed and stable Respiratory status: spontaneous breathing, nonlabored ventilation and respiratory function stable Cardiovascular status: blood pressure returned to baseline and stable Postop Assessment: no apparent nausea or vomiting Anesthetic complications: no   No notable events documented.  Last Vitals:  Vitals:   04/29/23 1730 04/29/23 1800  BP: (!) 141/73 (!) 170/76  Pulse: 68 83  Resp: 11 18  Temp: 36.6 C 36.4 C  SpO2: 93% 96%    Last Pain:  Vitals:   04/29/23 1800  TempSrc: Oral  PainSc: 8                  Jaleiyah Alas,E. Tenisha Fleece

## 2023-04-29 NOTE — H&P (Signed)
Thomas [name] is an 67 y.o. male.   HPI: 30M with incisional hernia, plan lap VHR. The patient has had no hospitalizations, doctors visits, ER visits, surgeries, or newly diagnosed allergies since being seen in the office.    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   Acute renal failure with acute cortical necrosis (HCC)    Acute respiratory failure with hypoxemia (HCC) 12/07/2020   Acute respiratory failure with hypoxia (HCC) 04/09/2014   Anxiety    Brain tumor (HCC)    frontal   Chronic kidney disease    Colon cancer (HCC)    Confusion    occasionally   Depression    Diabetes mellitus without complication (HCC)    Dizziness    Duodenal ulcer disease    Epilepsy (HCC)    takes Keppra daily   GERD (gastroesophageal reflux disease)    takes Omeprazole daily   Gout    Headache    Heart murmur    Hyperlipidemia    takes Atorvastatin daily   Hypertension    Meningioma (HCC) 09/01/2016   Peripheral edema    takes Lasix daily   Peripheral neuropathy    takes Gabapentin daily   Seizures (HCC)    Sleep apnea    no cpap   Urinary frequency    Viral gastroenteritis 09/29/2021    Past Surgical History:  Procedure Laterality Date   BIOPSY  09/05/2020   Procedure: BIOPSY;  Surgeon: Hilarie Fredrickson, MD;  Location: [facility] ENDOSCOPY;  Service: Endoscopy;;   BIOPSY  02/06/2021   Procedure: BIOPSY;  Surgeon: Beverley Fiedler, MD;  Location: [facility] ENDOSCOPY;  Service: Gastroenterology;;   CARDIAC CATHETERIZATION  7 yrs ago   COLONOSCOPY WITH PROPOFOL N/A 09/05/2020   Procedure: COLONOSCOPY WITH PROPOFOL;  Surgeon: Hilarie Fredrickson, MD;  Location[facility] ENDOSCOPY;  Service: Endoscopy;  Laterality: N/A;   CRANIOTOMY N/A 09/01/2016   Procedure: CRANIOTOMY TUMOR  LEFT PTERIONAL;  Surgeon: Coletta Memos, MD;  Location: [facility] OR;  Service:  Neurosurgery;  Laterality: N/A;   cyst removed from chest      as a child   ESOPHAGOGASTRODUODENOSCOPY (EGD) WITH PROPOFOL N/A 02/06/2021   Procedure: ESOPHAGOGASTRODUODENOSCOPY (EGD) WITH PROPOFOL;  Surgeon: Beverley Fiedler, MD;  Location: [facility] ENDOSCOPY;  Service: Gastroenterology;  Laterality: N/A;   EYE SURGERY Bilateral 02/08/2023   INGUINAL HERNIA REPAIR N/A 07/01/2022   Procedure: HERNIA REPAIR INGUINAL INCARCERATED;  Surgeon: Karie Soda, MD;  Location: [facility] ORS;  Service: General;  Laterality: N/A;   IR FLUORO GUIDE CV LINE LEFT  12/22/2020   IR GASTROSTOMY TUBE MOD SED  02/10/2021   IR GASTROSTOMY TUBE REMOVAL  04/15/2021   IR REMOVAL TUN CV CATH W/O FL  03/09/2021   IR US GUIDE VASC ACCESS LEFT  12/22/2020   LYSIS OF ADHESION N/A 07/01/2022   Procedure: LYSIS OF ADHESION;  Surgeon: Karie Soda, MD;  Location: [facility] ORS;  Service: General;  Laterality: N/A;   NO PAST SURGERIES     PARTIAL COLECTOMY N/A 09/10/2020   Procedure: TRANSVERSE COLECTOMY;  Surgeon: Diamantina Monks, MD;  Location: [facility] OR;  Service: General;  Laterality: N/A;   POLYPECTOMY  09/05/2020   Procedure: POLYPECTOMY;  Surgeon: Hilarie Fredrickson, MD;  Location: [facility] ENDOSCOPY;  Service: Endoscopy;;   PORT-A-CATH REMOVAL N/A 10/08/2022  Procedure: REMOVAL PORT-A-CATH;  Surgeon: Diamantina Monks, MD;  Location: [facility] OR;  Service: General;  Laterality: N/A;  45   PORTACATH PLACEMENT Right 10/01/2020   Procedure: INSERTION PORT-A-CATH;  Surgeon: Diamantina Monks, MD;  Location: [facility] OR;  Service: General;  Laterality: Right;   SUBMUCOSAL TATTOO INJECTION  09/05/2020   Procedure: SUBMUCOSAL TATTOO INJECTION;  Surgeon: Hilarie Fredrickson, MD;  Location: [facility] ENDOSCOPY;  Service: Endoscopy;;   TRACHEOSTOMY TUBE PLACEMENT N/A 01/05/2021   Procedure: TRACHEOSTOMY;  Surgeon: Serena Colonel, MD;  Location: [facility] OR;  Service: ENT;  Laterality: N/A;   XI ROBOTIC ASSISTED COLOSTOMY TAKEDOWN N/A 07/01/2022   Procedure: ROBOTIC OSTOMY TAKEDOWN WITH  INTRACORPOREAL ANASTOMOSIS WITH FIREFLY;  Surgeon: Karie Soda, MD;  Location: [facility] ORS;  Service: General;  Laterality: N/A;    Family History  Problem Relation Age of Onset   Breast cancer Mother    Breast cancer Maternal Aunt    Prostate cancer Maternal Uncle    Colon cancer Neg Hx    Esophageal cancer Neg Hx    Rectal cancer Neg Hx    Stomach cancer Neg Hx     Social History:  reports that he has quit smoking. His smoking use included cigarettes. He has never used smokeless tobacco. He reports that he does not drink alcohol and does not use drugs.  Allergies:  Allergies  Allergen Reactions   Morphine And Codeine Other (See Comments)    Throat swelling (can tolerate oxycodone/hydrocodone/hydromorphone)    Medications: I have reviewed the patient's current medications.  Results for orders placed or performed during the hospital encounter of 04/29/23 (from the past 48 hours)  Glucose, capillary     Status: Abnormal   Collection Time: 04/29/23 11:32 AM  Result Value Ref Range   Glucose-Capillary 102 (H) 70 - 99 mg/dL    Comment: Glucose reference range applies only to samples taken after fasting for at least 8 hours.   Comment 1 Notify RN     No results found.  ROS 10 point review of systems is negative except as listed above in HPI.   Physical Exam Blood pressure (!) 148/72, pulse 76, temperature 98.2 F (36.8 C), temperature source Oral, resp. rate 18, height 5\' 8"  (1.727 m), weight 93 kg, SpO2 100%. Constitutional: well-developed, well-nourished HEENT: pupils equal, round, reactive to light, 2mm b/l, moist conjunctiva, external inspection of ears and nose normal, hearing intact Oropharynx: normal oropharyngeal mucosa, normal dentition Neck: no thyromegaly, trachea midline, no midline cervical tenderness to palpation Chest: breath sounds equal bilaterally, normal respiratory effort, no midline or lateral chest wall tenderness to palpation/deformity Abdomen: soft, NT,  no bruising, no hepatosplenomegaly Skin: warm, dry, no rashes Psych: normal memory, normal mood/affect     Assessment/Plan: Ventral hernia - plan lap VHR today. Informed consent was obtained after detailed explanation of risks, including bleeding, infection, hematoma/seroma, temporary or permanent neuropathy, hernia recurrence, and mesh infection requiring explantation. All questions answered to the patient's satisfaction. FEN - strict NPO DVT - SCDs, LMWH Dispo -  home post-op     Diamantina Monks, MD General and Trauma Surgery [facility] Surgery

## 2023-04-29 NOTE — Anesthesia Procedure Notes (Signed)
Procedure Name: Intubation Date/Time: 04/29/2023 1:58 PM  Performed by: Thomasene Ripple, CRNAPre-anesthesia Checklist: Patient identified, Emergency Drugs available, Suction available and Patient being monitored Patient Re-evaluated:Patient Re-evaluated prior to induction Oxygen Delivery Method: Circle System Utilized Preoxygenation: Pre-oxygenation with 100% oxygen Induction Type: IV induction Ventilation: Mask ventilation without difficulty Laryngoscope Size: Miller and 3 Grade View: Grade I Tube type: Oral Tube size: 8.0 mm Number of attempts: 1 Airway Equipment and Method: Stylet and Oral airway Placement Confirmation: ETT inserted through vocal cords under direct vision, positive ETCO2 and breath sounds checked- equal and bilateral Secured at: 23 cm Tube secured with: Tape Dental Injury: Teeth and Oropharynx as per pre-operative assessment

## 2023-04-29 NOTE — Op Note (Addendum)
   Operative Note   Date: 04/29/2023  Procedure: open incisional hernia repair with biologic mesh, adhesiolysis, small bowel serosal repair x2  Pre-op diagnosis: incisional hernia Post-op diagnosis: incisional hernia x2 (3x6cm and 2x2cm, separated by 3cm; total width 8cm, total length 6cm)  Indication and clinical history: The patient is a 67 y.o. year old male with incisional hernia     Surgeon: Diamantina Monks, MD  Anesthesiologist: Jean Rosenthal, MD Anesthesia: General  Findings:  Specimen: hernia sac EBL: 25cc Drains/Implants: 11cm circular phasix mesh, 52F flat JP drain  Disposition: PACU - hemodynamically stable.  Description of procedure: The patient was positioned supine on the operating room table. General anesthetic induction and intubation were uneventful. Foley catheter insertion was performed and was atraumatic. Time-out was performed verifying correct patient, procedure, signature of informed consent, and administration of pre-operative antibiotics. The abdomen was prepped and draped in the usual sterile fashion.  The abdomen was accessed using a Veress needle, but the abdominal cavity did not insufflate.  A second attempt was made using the Veress needle and was again unsuccessful and therefore aborted.  Access was attempted using an optiview technique, however there was concern for significant adhesions visualized so optiview access was aborted.  A midline incision was made overlying the central abdominal wall hernia.  Dissection was carried down to the level of the hernia sac.  The abdominal cavity was entered and explored.  Adhesiolysis was performed in order to visualize the structures in the left upper quadrant.  During adhesiolysis unavoidable serosal injuries were made to the small bowel x 2.  These were repaired with Vicryl suture.  No bowel injury was identified.  Given the size of the primary incisional hernia and the presence of an adjacent incisional hernia, the  decision was made to primarily repair both hernias and augment the repair with a sheet of phasix mesh.  The phasic's mesh was secured in 4 quadrants in an underlay fashion.  The smaller hernia was primarily repaired with two #1 Novafil sutures, each in a figure-of-eight fashion.  After the mesh was secured, the dominant hernia was primarily repaired with #1 Novafil sutures in a figure-of-eight fashion.  A drain was placed in the subcutaneous tissues.  A layered closure was performed with staples used to approximate the skin edges.  A 4-0 Monocryl suture was used to close the Optiview site and Dermabond applied a sterile dressing.  The honeycomb dressing was applied to the midline incision.    All sponge and instrument counts were correct at the conclusion of the procedure. The patient was awakened from anesthesia, extubated uneventfully, and transported to the PACU in good condition. There were no complications.     Diamantina Monks, MD General and Trauma Surgery [facility]

## 2023-04-29 NOTE — Transfer of Care (Signed)
Immediate Anesthesia Transfer of Care Note  Patient: Thomas [name]  Procedure(s) Performed: VENTRAL HERNIA REPAIR WITH MESH LAPAROSCOPY DIAGNOSTIC  Patient Location: PACU  Anesthesia Type:General  Level of Consciousness: awake, alert , and oriented  Airway & Oxygen Therapy: Patient Spontanous Breathing and Patient connected to face mask oxygen  Post-op Assessment: Report given to RN and Post -op Vital signs reviewed and stable  Post vital signs: Reviewed and stable  Last Vitals:  Vitals Value Taken Time  BP 146/71 04/29/23 1603  Temp    Pulse 62 04/29/23 1607  Resp 13 04/29/23 1607  SpO2 99 % 04/29/23 1607  Vitals shown include unfiled device data.  Last Pain:  Vitals:   04/29/23 1211  TempSrc:   PainSc: 4       Patients Stated Pain Goal: 1 (04/29/23 1211)  Complications: No notable events documented.

## 2023-04-29 NOTE — Plan of Care (Signed)

## 2023-04-30 LAB — BASIC METABOLIC PANEL
Anion gap: 8 (ref 5–15)
BUN: 26 mg/dL — ABNORMAL HIGH (ref 8–23)
CO2: 22 mmol/L (ref 22–32)
Calcium: 8.5 mg/dL — ABNORMAL LOW (ref 8.9–10.3)
Chloride: 106 mmol/L (ref 98–111)
Creatinine, Ser: 1.59 mg/dL — ABNORMAL HIGH (ref 0.61–1.24)
GFR, Estimated: 48 mL/min — ABNORMAL LOW (ref >60)
Glucose, Bld: 115 mg/dL — ABNORMAL HIGH (ref 70–99)
Potassium: 4.7 mmol/L (ref 3.5–5.1)
Sodium: 136 mmol/L (ref 135–145)

## 2023-04-30 LAB — CBC
HCT: 36.6 % — ABNORMAL LOW (ref 39.0–52.0)
Hemoglobin: 12.7 g/dL — ABNORMAL LOW (ref 13.0–17.0)
MCH: 26.8 pg (ref 26.0–34.0)
MCHC: 34.7 g/dL (ref 30.0–36.0)
MCV: 77.4 fL — ABNORMAL LOW (ref 80.0–100.0)
Platelets: 258 10*3/uL (ref 150–400)
RBC: 4.73 MIL/uL (ref 4.22–5.81)
RDW: 14.4 % (ref 11.5–15.5)
WBC: 16.3 10*3/uL — ABNORMAL HIGH (ref 4.0–10.5)
nRBC: 0 % (ref 0.0–0.2)

## 2023-04-30 LAB — HIV ANTIBODY (ROUTINE TESTING W REFLEX): HIV Screen 4th Generation wRfx: NONREACTIVE

## 2023-04-30 MED ORDER — ATORVASTATIN CALCIUM 10 MG PO TABS
20.0000 mg | ORAL_TABLET | Freq: Every morning | ORAL | Status: DC
  Administered 2023-04-30 – 2023-05-18 (×18): 20 mg via ORAL
  Filled 2023-04-30 (×18): qty 2

## 2023-04-30 MED ORDER — LEVETIRACETAM 500 MG PO TABS
500.0000 mg | ORAL_TABLET | Freq: Two times a day (BID) | ORAL | Status: DC
Start: 2023-04-30 — End: 2023-05-18
  Administered 2023-04-30 – 2023-05-18 (×35): 500 mg via ORAL
  Filled 2023-04-30 (×35): qty 1

## 2023-04-30 MED ORDER — PANTOPRAZOLE SODIUM 40 MG PO TBEC
40.0000 mg | DELAYED_RELEASE_TABLET | Freq: Every day | ORAL | Status: DC
  Administered 2023-04-30 – 2023-05-18 (×18): 40 mg via ORAL
  Filled 2023-04-30 (×18): qty 1

## 2023-04-30 MED ORDER — ALLOPURINOL 100 MG PO TABS
100.0000 mg | ORAL_TABLET | Freq: Every day | ORAL | Status: DC
Start: 2023-04-30 — End: 2023-05-18
  Administered 2023-04-30 – 2023-05-17 (×18): 100 mg via ORAL
  Filled 2023-04-30 (×18): qty 1

## 2023-04-30 MED ORDER — AMLODIPINE BESYLATE 10 MG PO TABS
10.0000 mg | ORAL_TABLET | Freq: Every morning | ORAL | Status: DC
  Administered 2023-04-30 – 2023-05-18 (×19): 10 mg via ORAL
  Filled 2023-04-30: qty 1
  Filled 2023-04-30: qty 2
  Filled 2023-04-30 (×2): qty 1
  Filled 2023-04-30: qty 2
  Filled 2023-04-30 (×3): qty 1
  Filled 2023-04-30: qty 2
  Filled 2023-04-30 (×9): qty 1

## 2023-04-30 NOTE — Plan of Care (Signed)
  Problem: Education: Goal: Knowledge of General Education information will improve Description: Including pain rating scale, medication(s)/side effects and non-pharmacologic comfort measures Outcome: Progressing   Problem: Nutrition: Goal: Adequate nutrition will be maintained Outcome: Progressing   Problem: Coping: Goal: Level of anxiety will decrease Outcome: Progressing   Problem: Pain Management: Goal: General experience of comfort will improve Outcome: Progressing   Problem: Safety: Goal: Ability to remain [place] from injury will improve Outcome: Progressing   Problem: Skin Integrity: Goal: Risk for impaired skin integrity will decrease Outcome: Progressing

## 2023-04-30 NOTE — Evaluation (Signed)
Physical Therapy Evaluation Patient Details Name: Thomas [name] MRN: [id number] DOB: January 04, 1956 Today's Date: 04/30/2023  History of Present Illness  The pt is a 67 yo male presenting 12/20 for repair of incisional hernia from colostomy takedown 06/2022 which was complicated by prolonged postop ileus. PMH includes: OSA not on CPAP, seizures, brain tumor s/p resection 2018, peripheral edema, non-insulin-dependent DM2, HTN, GERD, peripheral neuropathy, colon cancer (s/p partial transverse colectomy and end colostomy 09/10/2020 followed by chemotherapy).   Clinical Impression  Pt in bed upon arrival of PT, agreeable to evaluation at this time. Prior to admission the pt was independent without use of DME, reports no recent falls, living at home with his wife, son, daughter-in-law, and grandchildren. He was able to complete all bed mobility, sit-stand transfers, hallway ambulation, and stairs without physical assistance. He used IV pole for single UE support with gait initially, but progressed to no UE support with continued mobility. The pt was educated on abdominal precautions, bracing for increased intraabdominal pressure, log roll, and progressive activity recommendations and expressed verbal understanding. Will benefit from continued mobility with staff and mobility specialists, anticipate safe to d/c home with family support and no new DME or follow up therapies.    Gait Speed: 0.42m/s using  (Gait speed <0.83m/s indicates increased risk of falls and dependence in ADLs)     If plan is discharge home, recommend the following: Help with stairs or ramp for entrance;Assist for transportation   Can travel by private vehicle        Equipment Recommendations None recommended by PT  Recommendations for Other Services       Functional Status Assessment Patient has had a recent decline in their functional status and demonstrates the ability to make significant improvements in function in a reasonable and  predictable amount of time.     Precautions / Restrictions Precautions Precautions: Fall Precaution Comments: abdominal surgery, abdominal binder Restrictions Weight Bearing Restrictions Per Provider Order: No      Mobility  Bed Mobility Overal bed mobility: Modified Independent             General bed mobility comments: cues for log roll, no assist    Transfers Overall transfer level: Needs assistance Equipment used: None Transfers: Sit to/from Stand Sit to Stand: Supervision           General transfer comment: no assist or UE support    Ambulation/Gait Ambulation/Gait assistance: Contact guard assist, Supervision Gait Distance (Feet): 250 Feet Assistive device: None, IV Pole Gait Pattern/deviations: Step-through pattern, Decreased stride length, Trunk flexed Gait velocity: 0.56 m/s Gait velocity interpretation: 1.31 - 2.62 ft/sec, indicative of limited community ambulator   General Gait Details: slight trunk flexion, improved wiht cues. IV pole initially for single UE support, progressed to no UE support  Stairs Stairs: Yes Stairs assistance: Supervision Stair Management: One rail Right, Alternating pattern, Forwards Number of Stairs: 5 General stair comments: reports pain when ascending with RLE, discussed use of LLE leading for comfort      Balance Overall balance assessment: Mild deficits observed, not formally tested                                           Pertinent Vitals/Pain Pain Assessment Pain Assessment: Faces Faces Pain Scale: Hurts a little bit Pain Location: incision and drain Pain Descriptors / Indicators: Grimacing Pain Intervention(s): Limited activity within patient's  tolerance, Monitored during session, Repositioned    Home Living Family/patient expects to be discharged to:: Private residence Living Arrangements: Spouse/significant other;Children (son and daughter in Social worker) Available Help at Discharge:  Family;Available PRN/intermittently Type of Home: House Home Access: Stairs to enter Entrance Stairs-Rails: Can reach both Entrance Stairs-Number of Steps: 4   Home Layout: Multi-level;Able to live on main level with bedroom/bathroom Home Equipment: Rolling Walker (2 wheels);Cane - single point;Wheelchair - manual Additional Comments: wife needs some assist  too, son present, working from home    Prior Function Prior Level of Function : Independent/Modified Independent             Mobility Comments: no DME, no falls ADLs Comments: independent, doing IADLs     Extremity/Trunk Assessment   Upper Extremity Assessment Upper Extremity Assessment: Overall WFL for tasks assessed    Lower Extremity Assessment Lower Extremity Assessment: Overall WFL for tasks assessed    Cervical / Trunk Assessment Cervical / Trunk Assessment: Other exceptions Cervical / Trunk Exceptions: abdominal surgery  Communication   Communication Communication: No apparent difficulties Cueing Techniques: Verbal cues  Cognition Arousal: Alert Behavior During Therapy: WFL for tasks assessed/performed Overall Cognitive Status: Within Functional Limits for tasks assessed                                          General Comments General comments (skin integrity, edema, etc.): VSS on RA        Assessment/Plan    PT Assessment Patient needs continued PT services  PT Problem List Decreased activity tolerance;Decreased balance;Decreased mobility       PT Treatment Interventions Gait training;Stair training;Therapeutic activities;Functional mobility training;Therapeutic exercise;Balance training    PT Goals (Current goals can be found in the Care Plan section)  Acute Rehab PT Goals Patient Stated Goal: return home PT Goal Formulation: With patient Time For Goal Achievement: 05/07/23 Potential to Achieve Goals: Good    Frequency Min 1X/week        AM-PAC PT "6 Clicks"  Mobility  Outcome Measure Help needed turning from your back to your side while in a flat bed without using bedrails?: None Help needed moving from lying on your back to sitting on the side of a flat bed without using bedrails?: None Help needed moving to and from a bed to a chair (including a wheelchair)?: A Little Help needed standing up from a chair using your arms (e.g., wheelchair or bedside chair)?: A Little Help needed to walk in hospital room?: A Little Help needed climbing 3-5 steps with a railing? : A Little 6 Click Score: 20    End of Session Equipment Utilized During Treatment: Gait belt Activity Tolerance: Patient tolerated treatment well Patient left: in chair;with call bell/phone within reach;with chair alarm set Nurse Communication: Mobility status PT Visit Diagnosis: Unsteadiness on feet (R26.81);Other abnormalities of gait and mobility (R26.89);Pain Pain - part of body:  (abdomen)    Time: 3244-0102 PT Time Calculation (min) (ACUTE ONLY): 27 min   Charges:   PT Evaluation $PT Eval Moderate Complexity: 1 Mod PT Treatments $Therapeutic Exercise: 8-22 mins PT General Charges $$ ACUTE PT VISIT: 1 Visit         Vickki Muff, PT, DPT   Acute Rehabilitation Department Office [phone] Secure Chat Communication Preferred  Ronnie Derby 04/30/2023, 4:55 PM

## 2023-04-30 NOTE — Progress Notes (Signed)
1 Day Post-Op   Subjective/Chief Complaint: Patient feels okay.  No bowel function.  Pain is controlled.   Objective: Vital signs in last 24 hours: Temp:  [97.1 F (36.2 C)-98.2 F (36.8 C)] 97.6 F (36.4 C) (12/21 0847) Pulse Rate:  [63-83] 68 (12/21 0847) Resp:  [11-19] 18 (12/21 0847) BP: (141-170)/(62-84) 143/62 (12/21 0847) SpO2:  [92 %-100 %] 98 % (12/21 0847) Weight:  [93 kg] 93 kg (12/20 1134) Last BM Date : 04/28/23  Intake/Output from previous day: 12/20 0701 - 12/21 0700 In: 950 [I.V.:550; IV Piggyback:400] Out: 480 [Urine:400; Drains:70; Blood:10] Intake/Output this shift: No intake/output data recorded.  General appearance: alert and cooperative Resp: clear to auscultation bilaterally Cardio: Normal sinus rhythm Incision/Wound: Honeycomb dressing in place.  Incision clean dry intact  Lab Results:  Recent Labs    04/30/23 0517  WBC 16.3*  HGB 12.7*  HCT 36.6*  PLT 258   BMET Recent Labs    04/30/23 0517  NA 136  K 4.7  CL 106  CO2 22  GLUCOSE 115*  BUN 26*  CREATININE 1.59*  CALCIUM 8.5*   PT/INR No results for input(s): "LABPROT", "INR" in the last 72 hours. ABG No results for input(s): "PHART", "HCO3" in the last 72 hours.  Invalid input(s): "PCO2", "PO2"  Studies/Results: No results found.  Anti-infectives: Anti-infectives (From admission, onward)    Start     Dose/Rate Route Frequency Ordered Stop   04/29/23 1205  ceFAZolin (ANCEF) 2-4 GM/100ML-% IVPB       Note to Pharmacy: Dorann Ou N: cabinet override      04/29/23 1205 04/29/23 1409   04/29/23 1145  ceFAZolin (ANCEF) IVPB 2g/100 mL premix        2 g 200 mL/hr over 30 Minutes Intravenous On call to O.R. 04/29/23 1140 04/29/23 1438       Assessment/Plan: s/p Procedure(s): VENTRAL HERNIA REPAIR WITH MESH (N/A) LAPAROSCOPY DIAGNOSTIC   Ambulation  Advance diet  Continue JP drain serosanguineous thus far  LOS: 1 day    Dortha Schwalbe MD 04/30/2023

## 2023-04-30 NOTE — Plan of Care (Signed)

## 2023-05-01 MED ORDER — POLYETHYLENE GLYCOL 3350 17 G PO PACK
17.0000 g | PACK | Freq: Two times a day (BID) | ORAL | Status: DC
  Administered 2023-05-01 – 2023-05-08 (×12): 17 g via ORAL
  Filled 2023-05-01 (×19): qty 1

## 2023-05-01 MED ORDER — ENSURE ENLIVE PO LIQD
237.0000 mL | Freq: Two times a day (BID) | ORAL | Status: DC
  Administered 2023-05-01 – 2023-05-04 (×4): 237 mL via ORAL
  Filled 2023-05-01: qty 237

## 2023-05-01 NOTE — Plan of Care (Signed)

## 2023-05-01 NOTE — Progress Notes (Signed)
2 Days Post-Op   Subjective/Chief Complaint: Patient with no BM.  Having flatus pain improved   Objective: Vital signs in last 24 hours: Temp:  [97.6 F (36.4 C)-98.6 F (37 C)] 97.6 F (36.4 C) (12/22 0912) Pulse Rate:  [75-82] 82 (12/22 0912) Resp:  [16-20] 16 (12/22 0912) BP: (119-141)/(63-73) 119/69 (12/22 0912) SpO2:  [97 %-99 %] 97 % (12/22 0912) Last BM Date : 04/28/23  Intake/Output from previous day: 12/21 0701 - 12/22 0700 In: 100 [IV Piggyback:100] Out: 1300 [Urine:1300] Intake/Output this shift: No intake/output data recorded.  General appearance: alert and cooperative Resp: clear to auscultation bilaterally Cardio: no S3 or S4 and normal sinus rhythm Incision/Wound: Clean dry intact dressing minimal drainage in place  Lab Results:  Recent Labs    04/30/23 0517  WBC 16.3*  HGB 12.7*  HCT 36.6*  PLT 258   BMET Recent Labs    04/30/23 0517  NA 136  K 4.7  CL 106  CO2 22  GLUCOSE 115*  BUN 26*  CREATININE 1.59*  CALCIUM 8.5*   PT/INR No results for input(s): "LABPROT", "INR" in the last 72 hours. ABG No results for input(s): "PHART", "HCO3" in the last 72 hours.  Invalid input(s): "PCO2", "PO2"  Studies/Results: No results found.  Anti-infectives: Anti-infectives (From admission, onward)    Start     Dose/Rate Route Frequency Ordered Stop   04/29/23 1205  ceFAZolin (ANCEF) 2-4 GM/100ML-% IVPB       Note to Pharmacy: Dorann Ou N: cabinet override      04/29/23 1205 04/29/23 1409   04/29/23 1145  ceFAZolin (ANCEF) IVPB 2g/100 mL premix        2 g 200 mL/hr over 30 Minutes Intravenous On call to O.R. 04/29/23 1140 04/29/23 1438       Assessment/Plan: s/p Procedure(s): VENTRAL HERNIA REPAIR WITH MESH (N/A) LAPAROSCOPY DIAGNOSTIC Ambulate  Continue pain control  MiraLAX for BM  Expect discharge to be Monday  LOS: 2 days    Dortha Schwalbe MD 05/01/2023

## 2023-05-02 ENCOUNTER — Encounter (HOSPITAL_COMMUNITY): Payer: Self-pay | Admitting: Surgery

## 2023-05-02 LAB — CBC
HCT: 33.3 % — ABNORMAL LOW (ref 39.0–52.0)
Hemoglobin: 11.7 g/dL — ABNORMAL LOW (ref 13.0–17.0)
MCH: 27.1 pg (ref 26.0–34.0)
MCHC: 35.1 g/dL (ref 30.0–36.0)
MCV: 77.3 fL — ABNORMAL LOW (ref 80.0–100.0)
Platelets: 255 10*3/uL (ref 150–400)
RBC: 4.31 MIL/uL (ref 4.22–5.81)
RDW: 14.5 % (ref 11.5–15.5)
WBC: 13.5 10*3/uL — ABNORMAL HIGH (ref 4.0–10.5)
nRBC: 0 % (ref 0.0–0.2)

## 2023-05-02 LAB — BASIC METABOLIC PANEL
Anion gap: 10 (ref 5–15)
BUN: 28 mg/dL — ABNORMAL HIGH (ref 8–23)
CO2: 22 mmol/L (ref 22–32)
Calcium: 9 mg/dL (ref 8.9–10.3)
Chloride: 102 mmol/L (ref 98–111)
Creatinine, Ser: 1.7 mg/dL — ABNORMAL HIGH (ref 0.61–1.24)
GFR, Estimated: 44 mL/min — ABNORMAL LOW (ref >60)
Glucose, Bld: 92 mg/dL (ref 70–99)
Potassium: 4.4 mmol/L (ref 3.5–5.1)
Sodium: 134 mmol/L — ABNORMAL LOW (ref 135–145)

## 2023-05-02 MED ORDER — LACTATED RINGERS IV BOLUS
500.0000 mL | Freq: Once | INTRAVENOUS | Status: AC
  Administered 2023-05-02: 500 mL via INTRAVENOUS

## 2023-05-02 MED ORDER — OXYCODONE HCL 5 MG PO TABS
10.0000 mg | ORAL_TABLET | Freq: Once | ORAL | Status: AC
  Administered 2023-05-02: 10 mg via ORAL
  Filled 2023-05-02: qty 2

## 2023-05-02 MED ORDER — SODIUM CHLORIDE 0.9% FLUSH: 10.0000 mL | INTRAVENOUS | Status: DC | PRN

## 2023-05-02 MED ORDER — SODIUM CHLORIDE 0.9% FLUSH
10.0000 mL | Freq: Two times a day (BID) | INTRAVENOUS | Status: DC
  Administered 2023-05-02 – 2023-05-07 (×9): 10 mL
  Administered 2023-05-10: 30 mL
  Administered 2023-05-10 – 2023-05-18 (×14): 10 mL

## 2023-05-02 MED ORDER — MAGNESIUM CITRATE PO SOLN
0.5000 | Freq: Once | ORAL | Status: DC
Start: 2023-05-03 — End: 2023-05-03
  Filled 2023-05-02: qty 296

## 2023-05-02 MED ORDER — OXYCODONE HCL 5 MG PO TABS
10.0000 mg | ORAL_TABLET | ORAL | Status: DC | PRN
  Administered 2023-05-02 – 2023-05-03 (×2): 10 mg via ORAL
  Administered 2023-05-03 – 2023-05-06 (×8): 15 mg via ORAL
  Administered 2023-05-07: 10 mg via ORAL
  Administered 2023-05-07 (×2): 15 mg via ORAL
  Administered 2023-05-08 – 2023-05-10 (×4): 10 mg via ORAL
  Administered 2023-05-10 – 2023-05-12 (×3): 15 mg via ORAL
  Administered 2023-05-14: 10 mg via ORAL
  Administered 2023-05-15: 15 mg via ORAL
  Administered 2023-05-15: 10 mg via ORAL
  Administered 2023-05-15: 15 mg via ORAL
  Filled 2023-05-02 (×2): qty 3
  Filled 2023-05-02: qty 2
  Filled 2023-05-02: qty 3
  Filled 2023-05-02: qty 2
  Filled 2023-05-02: qty 3
  Filled 2023-05-02 (×2): qty 2
  Filled 2023-05-02 (×3): qty 3
  Filled 2023-05-02: qty 2
  Filled 2023-05-02 (×2): qty 3
  Filled 2023-05-02 (×2): qty 2
  Filled 2023-05-02 (×5): qty 3
  Filled 2023-05-02: qty 2
  Filled 2023-05-02: qty 3
  Filled 2023-05-02: qty 2
  Filled 2023-05-02: qty 3

## 2023-05-02 MED ORDER — METHOCARBAMOL 500 MG PO TABS
1000.0000 mg | ORAL_TABLET | Freq: Four times a day (QID) | ORAL | Status: DC
Start: 2023-05-03 — End: 2023-05-18
  Administered 2023-05-03 – 2023-05-18 (×59): 1000 mg via ORAL
  Filled 2023-05-02 (×60): qty 2

## 2023-05-02 NOTE — Plan of Care (Signed)
  Problem: Education: Goal: Knowledge of General Education information will improve Description: Including pain rating scale, medication(s)/side effects and non-pharmacologic comfort measures Outcome: Progressing   Problem: Clinical Measurements: Goal: Diagnostic test results will improve Outcome: Progressing   Problem: Coping: Goal: Level of anxiety will decrease Outcome: Progressing   Problem: Pain Management: Goal: General experience of comfort will improve Outcome: Progressing

## 2023-05-02 NOTE — Progress Notes (Addendum)
   General Surgery Follow Up Note  Subjective:    Overnight Issues:   Objective:  Vital signs for last 24 hours: Temp:  [97.5 F (36.4 C)-98.1 F (36.7 C)] 97.5 F (36.4 C) (12/23 2105) Pulse Rate:  [81-91] 81 (12/23 2105) Resp:  [17-19] 17 (12/23 2105) BP: (128-149)/(62-78) 138/78 (12/23 2105) SpO2:  [96 %-97 %] 97 % (12/23 2105)  Hemodynamic parameters for last 24 hours:    Intake/Output from previous day: 12/22 0701 - 12/23 0700 In: -  Out: 350 [Urine:300; Drains:50]  Intake/Output this shift: Total I/O In: -  Out: 380 [Urine:380]  Vent settings for last 24 hours:    Physical Exam:  Gen: comfortable, no distress Neuro: follows commands, alert, communicative, moderate to poor pain control HEENT: PERRL Neck: supple CV: RRR Pulm: unlabored breathing on RA Abd: soft, NT, incision clean, dry, intact, JP SS GU: urine clear and yellow, +spontaneous void Extr: wwp, no edema  Results for orders placed or performed during the hospital encounter of 04/29/23 (from the past 24 hours)  CBC     Status: Abnormal   Collection Time: 05/02/23 11:30 AM  Result Value Ref Range   WBC 13.5 (H) 4.0 - 10.5 K/uL   RBC 4.31 4.22 - 5.81 MIL/uL   Hemoglobin 11.7 (L) 13.0 - 17.0 g/dL   HCT 16.1 (L) 09.6 - 04.5 %   MCV 77.3 (L) 80.0 - 100.0 fL   MCH 27.1 26.0 - 34.0 pg   MCHC 35.1 30.0 - 36.0 g/dL   RDW 40.9 81.1 - 91.4 %   Platelets 255 150 - 400 K/uL   nRBC 0.0 0.0 - 0.2 %  Basic metabolic panel     Status: Abnormal   Collection Time: 05/02/23 11:30 AM  Result Value Ref Range   Sodium 134 (L) 135 - 145 mmol/L   Potassium 4.4 3.5 - 5.1 mmol/L   Chloride 102 98 - 111 mmol/L   CO2 22 22 - 32 mmol/L   Glucose, Bld 92 70 - 99 mg/dL   BUN 28 (H) 8 - 23 mg/dL   Creatinine, Ser 7.82 (H) 0.61 - 1.24 mg/dL   Calcium 9.0 8.9 - 95.6 mg/dL   GFR, Estimated 44 (L) >60 mL/min   Anion gap 10 5 - 15    Assessment & Plan: The plan of care was discussed with the bedside nurse for the  day, who is in agreement with this plan and no additional concerns were raised.   Present on Admission:  Incisional hernia    LOS: 3 days   Additional comments:I reviewed the patient's new clinical lab test results.   and I reviewed the patients new imaging test results.    VHR with biologic mesh - AROBF, incision cdi, JP drain o/p slowing, WBC downtrending, continue mobilization and pain control FEN - soft diet DVT - SCDs, LMWH Dispo - med-surg    Diamantina Monks, MD Trauma & General Surgery Please use [email or web address] to contact on call provider  05/02/2023  *Care during the described time interval was provided by me. I have reviewed this patient's available data, including medical history, events of note, physical examination and test results as part of my evaluation.

## 2023-05-02 NOTE — Plan of Care (Signed)
  Problem: Education: Goal: Knowledge of General Education information will improve Description: Including pain rating scale, medication(s)/side effects and non-pharmacologic comfort measures Outcome: Progressing   Problem: Health Behavior/Discharge Planning: Goal: Ability to manage health-related needs will improve Outcome: Progressing   Problem: Clinical Measurements: Goal: Ability to maintain clinical measurements within normal limits will improve Outcome: Progressing Goal: Will remain free from infection Outcome: Progressing Goal: Diagnostic test results will improve Outcome: Progressing Goal: Respiratory complications will improve Outcome: Progressing Goal: Cardiovascular complication will be avoided Outcome: Progressing   Problem: Activity: Goal: Risk for activity intolerance will decrease Outcome: Progressing   Problem: Nutrition: Goal: Adequate nutrition will be maintained Outcome: Progressing   Problem: Coping: Goal: Level of anxiety will decrease Outcome: Progressing   Problem: Pain Management: Goal: General experience of comfort will improve Outcome: Progressing   Problem: Elimination: Goal: Will not experience complications related to bowel motility Outcome: Progressing Goal: Will not experience complications related to urinary retention Outcome: Progressing   Problem: Safety: Goal: Ability to remain free from injury will improve Outcome: Progressing

## 2023-05-02 NOTE — Progress Notes (Signed)

## 2023-05-02 NOTE — Care Management Important Message (Signed)
Important Message  Patient Details  Name: Thomas [name] MRN: [id number] Date of Birth: 1955/11/13   Important Message Given:  Yes - Medicare IM     Sherilyn Banker 05/02/2023, 3:14 PM

## 2023-05-03 LAB — CBC
HCT: 33.4 % — ABNORMAL LOW (ref 39.0–52.0)
Hemoglobin: 11.5 g/dL — ABNORMAL LOW (ref 13.0–17.0)
MCH: 26.7 pg (ref 26.0–34.0)
MCHC: 34.4 g/dL (ref 30.0–36.0)
MCV: 77.7 fL — ABNORMAL LOW (ref 80.0–100.0)
Platelets: 252 10*3/uL (ref 150–400)
RBC: 4.3 MIL/uL (ref 4.22–5.81)
RDW: 14.5 % (ref 11.5–15.5)
WBC: 15.7 10*3/uL — ABNORMAL HIGH (ref 4.0–10.5)
nRBC: 0 % (ref 0.0–0.2)

## 2023-05-03 LAB — BASIC METABOLIC PANEL
Anion gap: 7 (ref 5–15)
BUN: 27 mg/dL — ABNORMAL HIGH (ref 8–23)
CO2: 24 mmol/L (ref 22–32)
Calcium: 9.1 mg/dL (ref 8.9–10.3)
Chloride: 102 mmol/L (ref 98–111)
Creatinine, Ser: 1.77 mg/dL — ABNORMAL HIGH (ref 0.61–1.24)
GFR, Estimated: 42 mL/min — ABNORMAL LOW (ref >60)
Glucose, Bld: 100 mg/dL — ABNORMAL HIGH (ref 70–99)
Potassium: 4.6 mmol/L (ref 3.5–5.1)
Sodium: 133 mmol/L — ABNORMAL LOW (ref 135–145)

## 2023-05-03 LAB — SURGICAL PATHOLOGY

## 2023-05-03 MED ORDER — SODIUM CHLORIDE 0.9 % IV BOLUS
1000.0000 mL | Freq: Once | INTRAVENOUS | Status: AC
  Administered 2023-05-03: 1000 mL via INTRAVENOUS

## 2023-05-03 MED ORDER — DIPHENHYDRAMINE HCL 12.5 MG/5ML PO ELIX
12.5000 mg | ORAL_SOLUTION | Freq: Four times a day (QID) | ORAL | Status: DC | PRN
  Administered 2023-05-03 – 2023-05-07 (×6): 25 mg via ORAL
  Filled 2023-05-03 (×6): qty 10

## 2023-05-03 MED ORDER — GABAPENTIN 100 MG PO CAPS
200.0000 mg | ORAL_CAPSULE | Freq: Every day | ORAL | Status: DC
  Administered 2023-05-03 – 2023-05-17 (×15): 200 mg via ORAL
  Filled 2023-05-03 (×15): qty 2

## 2023-05-03 MED ORDER — GABAPENTIN 100 MG PO CAPS
100.0000 mg | ORAL_CAPSULE | Freq: Every morning | ORAL | Status: DC
Start: 2023-05-03 — End: 2023-05-18
  Administered 2023-05-03 – 2023-05-18 (×15): 100 mg via ORAL
  Filled 2023-05-03 (×15): qty 1

## 2023-05-03 NOTE — Plan of Care (Signed)

## 2023-05-03 NOTE — Progress Notes (Signed)
Trauma/Critical Care Follow Up Note  Subjective:    Overnight Issues:   Objective:  Vital signs for last 24 hours: Temp:  [97.5 F (36.4 C)-98.1 F (36.7 C)] 97.9 F (36.6 C) (12/24 0810) Pulse Rate:  [81-93] 93 (12/24 0810) Resp:  [15-18] 18 (12/24 0810) BP: (127-155)/(70-80) 155/80 (12/24 0810) SpO2:  [94 %-97 %] 94 % (12/24 0810)  Hemodynamic parameters for last 24 hours:    Intake/Output from previous day: 12/23 0701 - 12/24 0700 In: 501.7 [IV Piggyback:501.7] Out: 1496 [Urine:1470; Drains:26]  Intake/Output this shift: No intake/output data recorded.  Vent settings for last 24 hours:    Physical Exam:  Gen: comfortable, no distress Neuro: follows commands, alert, communicative HEENT: PERRL Neck: supple CV: RRR Pulm: unlabored breathing on RA Abd: soft, appropriately TTP, , JP SS,  +flatus GU: urine clear and yellow, +spontaneous voids Extr: wwp, no edema  Results for orders placed or performed during the hospital encounter of 04/29/23 (from the past 24 hours)  CBC     Status: Abnormal   Collection Time: 05/02/23 11:30 AM  Result Value Ref Range   WBC 13.5 (H) 4.0 - 10.5 K/uL   RBC 4.31 4.22 - 5.81 MIL/uL   Hemoglobin 11.7 (L) 13.0 - 17.0 g/dL   HCT 10.2 (L) 72.5 - 36.6 %   MCV 77.3 (L) 80.0 - 100.0 fL   MCH 27.1 26.0 - 34.0 pg   MCHC 35.1 30.0 - 36.0 g/dL   RDW 44.0 34.7 - 42.5 %   Platelets 255 150 - 400 K/uL   nRBC 0.0 0.0 - 0.2 %  Basic metabolic panel     Status: Abnormal   Collection Time: 05/02/23 11:30 AM  Result Value Ref Range   Sodium 134 (L) 135 - 145 mmol/L   Potassium 4.4 3.5 - 5.1 mmol/L   Chloride 102 98 - 111 mmol/L   CO2 22 22 - 32 mmol/L   Glucose, Bld 92 70 - 99 mg/dL   BUN 28 (H) 8 - 23 mg/dL   Creatinine, Ser 9.56 (H) 0.61 - 1.24 mg/dL   Calcium 9.0 8.9 - 38.7 mg/dL   GFR, Estimated 44 (L) >60 mL/min   Anion gap 10 5 - 15  CBC     Status: Abnormal   Collection Time: 05/03/23  1:57 AM  Result Value Ref Range   WBC  15.7 (H) 4.0 - 10.5 K/uL   RBC 4.30 4.22 - 5.81 MIL/uL   Hemoglobin 11.5 (L) 13.0 - 17.0 g/dL   HCT 56.4 (L) 33.2 - 95.1 %   MCV 77.7 (L) 80.0 - 100.0 fL   MCH 26.7 26.0 - 34.0 pg   MCHC 34.4 30.0 - 36.0 g/dL   RDW 88.4 16.6 - 06.3 %   Platelets 252 150 - 400 K/uL   nRBC 0.0 0.0 - 0.2 %  Basic metabolic panel     Status: Abnormal   Collection Time: 05/03/23  1:57 AM  Result Value Ref Range   Sodium 133 (L) 135 - 145 mmol/L   Potassium 4.6 3.5 - 5.1 mmol/L   Chloride 102 98 - 111 mmol/L   CO2 24 22 - 32 mmol/L   Glucose, Bld 100 (H) 70 - 99 mg/dL   BUN 27 (H) 8 - 23 mg/dL   Creatinine, Ser 0.16 (H) 0.61 - 1.24 mg/dL   Calcium 9.1 8.9 - 01.0 mg/dL   GFR, Estimated 42 (L) >60 mL/min   Anion gap 7 5 - 15  Assessment & Plan: The plan of care was discussed with the bedside nurse for the day, who is in agreement with this plan and no additional concerns were raised.   Present on Admission:  Incisional hernia    LOS: 4 days   Additional comments:I reviewed the patient's new clinical lab test results.   and I reviewed the patients new imaging test results.    VHR with biologic mesh -+flatus, AcROBF, incision cdi, JP drain o/p slowing, WBC up today, recheck in AM, continue mobilization and pain control. CT A/P in AM if no improvement. Path negative.  FEN - soft diet, 1L NS bolus-slow DVT - SCDs, LMWH Dispo - med-surg     Diamantina Monks, MD Trauma & General Surgery Please use [email or web address] to contact on call provider  05/03/2023  *Care during the described time interval was provided by me. I have reviewed this patient's available data, including medical history, events of note, physical examination and test results as part of my evaluation.

## 2023-05-03 NOTE — Progress Notes (Signed)
Physical Therapy Treatment Patient Details Name: Thomas [name] MRN: [id number] DOB: 08/12/1955 Today's Date: 05/03/2023   History of Present Illness The pt is a 67 yo male presenting 12/20 for repair of incisional hernia from colostomy takedown 06/2022 which was complicated by prolonged postop ileus. PMH includes: OSA not on CPAP, seizures, brain tumor s/p resection 2018, peripheral edema, non-insulin-dependent DM2, HTN, GERD, peripheral neuropathy, colon cancer (s/p partial transverse colectomy and end colostomy 09/10/2020 followed by chemotherapy).    PT Comments  Patient continues to be limited mostly by pain.  Physically patient ambulating well with supervision and pushing IV pole.  Feel as pain decreases, patient will return to independence.  Will continue to follow for mobility progression.  Of note, during today's session, after about 70', patient with excruciating pain in bil feet, relieved somewhat once he returned to supine.     If plan is discharge home, recommend the following: Help with stairs or ramp for entrance;Assist for transportation   Can travel by private vehicle        Equipment Recommendations  None recommended by PT    Recommendations for Other Services       Precautions / Restrictions Precautions Precautions: Fall Precaution Comments: abdominal surgery, abdominal binder Restrictions Weight Bearing Restrictions Per Provider Order: No     Mobility  Bed Mobility Overal bed mobility: Modified Independent             General bed mobility comments: cues for log roll, no assist    Transfers Overall transfer level: Needs assistance Equipment used: None Transfers: Sit to/from Stand Sit to Stand: Supervision                Ambulation/Gait Ambulation/Gait assistance: Supervision Gait Distance (Feet): 100 Feet Assistive device: IV Pole Gait Pattern/deviations: Step-through pattern, Decreased stride length, Trunk flexed       General Gait  Details: after about 70', patient complaining of stabbing pain in feet, somewhat relieved by returning to supine.   Stairs             Wheelchair Mobility     Tilt Bed    Modified Rankin (Stroke Patients Only)       Balance Overall balance assessment: No apparent balance deficits (not formally assessed)                                          Cognition Arousal: Alert Behavior During Therapy: WFL for tasks assessed/performed Overall Cognitive Status: Within Functional Limits for tasks assessed                                          Exercises      General Comments        Pertinent Vitals/Pain Pain Assessment Pain Assessment: 0-10 Pain Score: 8  Pain Location: feet > abdomen Pain Descriptors / Indicators: Stabbing Pain Intervention(s): Repositioned, Utilized relaxation techniques, Monitored during session, Limited activity within patient's tolerance    Home Living                          Prior Function            PT Goals (current goals can now be found in the care plan section) Progress towards PT goals: Progressing toward  goals    Frequency    Min 1X/week      PT Plan      Co-evaluation              AM-PAC PT "6 Clicks" Mobility   Outcome Measure  Help needed turning from your back to your side while in a flat bed without using bedrails?: None Help needed moving from lying on your back to sitting on the side of a flat bed without using bedrails?: A Little Help needed moving to and from a bed to a chair (including a wheelchair)?: A Little Help needed standing up from a chair using your arms (e.g., wheelchair or bedside chair)?: A Little Help needed to walk in hospital room?: A Little Help needed climbing 3-5 steps with a railing? : A Little 6 Click Score: 19    End of Session   Activity Tolerance: Patient limited by pain Patient left: in bed;with call bell/phone within reach;with  bed alarm set   PT Visit Diagnosis: Other abnormalities of gait and mobility (R26.89);Muscle weakness (generalized) (M62.81);Pain Pain - Right/Left: Left Pain - part of body: Leg     Time: 1100-1115 PT Time Calculation (min) (ACUTE ONLY): 15 min  Charges:    $Gait Training: 8-22 mins PT General Charges $$ ACUTE PT VISIT: 1 Visit                     05/03/2023 Delray Alt, PT Acute Rehabilitation Services Office:  [phone]    Olivia Canter 05/03/2023, 11:26 AM

## 2023-05-04 ENCOUNTER — Inpatient Hospital Stay (HOSPITAL_COMMUNITY): Payer: Medicare PPO

## 2023-05-04 LAB — BASIC METABOLIC PANEL
Anion gap: 11 (ref 5–15)
BUN: 35 mg/dL — ABNORMAL HIGH (ref 8–23)
CO2: 22 mmol/L (ref 22–32)
Calcium: 8.9 mg/dL (ref 8.9–10.3)
Chloride: 101 mmol/L (ref 98–111)
Creatinine, Ser: 2.03 mg/dL — ABNORMAL HIGH (ref 0.61–1.24)
GFR, Estimated: 35 mL/min — ABNORMAL LOW (ref >60)
Glucose, Bld: 113 mg/dL — ABNORMAL HIGH (ref 70–99)
Potassium: 5.3 mmol/L — ABNORMAL HIGH (ref 3.5–5.1)
Sodium: 134 mmol/L — ABNORMAL LOW (ref 135–145)

## 2023-05-04 LAB — CBC
HCT: 33.3 % — ABNORMAL LOW (ref 39.0–52.0)
Hemoglobin: 11.6 g/dL — ABNORMAL LOW (ref 13.0–17.0)
MCH: 27 pg (ref 26.0–34.0)
MCHC: 34.8 g/dL (ref 30.0–36.0)
MCV: 77.4 fL — ABNORMAL LOW (ref 80.0–100.0)
Platelets: 309 10*3/uL (ref 150–400)
RBC: 4.3 MIL/uL (ref 4.22–5.81)
RDW: 14.6 % (ref 11.5–15.5)
WBC: 8.7 10*3/uL (ref 4.0–10.5)
nRBC: 0 % (ref 0.0–0.2)

## 2023-05-04 MED ORDER — SODIUM ZIRCONIUM CYCLOSILICATE 10 G PO PACK
10.0000 g | PACK | Freq: Once | ORAL | Status: AC
  Administered 2023-05-04: 10 g via ORAL
  Filled 2023-05-04: qty 1

## 2023-05-04 MED ORDER — GUAIFENESIN 100 MG/5ML PO LIQD
10.0000 mL | Freq: Four times a day (QID) | ORAL | Status: AC
  Administered 2023-05-04 (×3): 10 mL via ORAL
  Filled 2023-05-04 (×4): qty 10

## 2023-05-04 MED ORDER — BISACODYL 10 MG RE SUPP
10.0000 mg | Freq: Once | RECTAL | Status: AC
  Administered 2023-05-04: 10 mg via RECTAL
  Filled 2023-05-04: qty 1

## 2023-05-04 NOTE — Progress Notes (Signed)
   General Surgery Follow Up Note  Subjective:    Overnight Issues:   Objective:  Vital signs for last 24 hours: Temp:  [98.1 F (36.7 C)-98.6 F (37 C)] 98.1 F (36.7 C) (12/25 0720) Pulse Rate:  [90-92] 91 (12/25 0720) Resp:  [17-18] 18 (12/25 0425) BP: (132-148)/(74-78) 148/77 (12/25 0720) SpO2:  [93 %-98 %] 94 % (12/25 0720)  Hemodynamic parameters for last 24 hours:    Intake/Output from previous day: 12/24 0701 - 12/25 0700 In: -  Out: 10 [Drains:10]  Intake/Output this shift: No intake/output data recorded.  Vent settings for last 24 hours:    Physical Exam:  Gen: comfortable, no distress Neuro: follows commands, alert, communicative HEENT: PERRL Neck: supple CV: RRR Pulm: unlabored breathing on RA Abd: soft, NT, incision clean, dry, intact, JP SS GU: urine clear and yellow, +spontaneous void Extr: wwp, no edema  Results for orders placed or performed during the hospital encounter of 04/29/23 (from the past 24 hours)  CBC     Status: Abnormal   Collection Time: 05/04/23  1:51 AM  Result Value Ref Range   WBC 8.7 4.0 - 10.5 K/uL   RBC 4.30 4.22 - 5.81 MIL/uL   Hemoglobin 11.6 (L) 13.0 - 17.0 g/dL   HCT 16.1 (L) 09.6 - 04.5 %   MCV 77.4 (L) 80.0 - 100.0 fL   MCH 27.0 26.0 - 34.0 pg   MCHC 34.8 30.0 - 36.0 g/dL   RDW 40.9 81.1 - 91.4 %   Platelets 309 150 - 400 K/uL   nRBC 0.0 0.0 - 0.2 %  Basic metabolic panel     Status: Abnormal   Collection Time: 05/04/23  1:51 AM  Result Value Ref Range   Sodium 134 (L) 135 - 145 mmol/L   Potassium 5.3 (H) 3.5 - 5.1 mmol/L   Chloride 101 98 - 111 mmol/L   CO2 22 22 - 32 mmol/L   Glucose, Bld 113 (H) 70 - 99 mg/dL   BUN 35 (H) 8 - 23 mg/dL   Creatinine, Ser 7.82 (H) 0.61 - 1.24 mg/dL   Calcium 8.9 8.9 - 95.6 mg/dL   GFR, Estimated 35 (L) >60 mL/min   Anion gap 11 5 - 15    Assessment & Plan: The plan of care was discussed with the bedside nurse for the day, who is in agreement with this plan and no  additional concerns were raised.   Present on Admission:  Incisional hernia    LOS: 5 days   Additional comments:I reviewed the patient's new clinical lab test results.   and I reviewed the patients new imaging test results.    VHR with biologic mesh -+flatus, AcROBF, incision cdi, JP drain o/p slowing, WBC normalized, continue mobilization and pain control. Path negative.  Rising creatinine and hyperkalemia - lokelma x1 dose, recheck BMP in AM or as o/p FEN - soft diet DVT - SCDs, LMWH Dispo - med-surg   Diamantina Monks, MD Trauma & General Surgery Please use [email or web address] to contact on call provider  05/04/2023  *Care during the described time interval was provided by me. I have reviewed this patient's available data, including medical history, events of note, physical examination and test results as part of my evaluation.

## 2023-05-04 NOTE — Progress Notes (Signed)
Pt. Off the unit for CT scan

## 2023-05-04 NOTE — Progress Notes (Signed)
Pt. c/o nausea, nurse noted 100 ml of green emesis, per documentation patient has not had a bowel movement and 5 day. MD notified see chart for new orders

## 2023-05-04 NOTE — Plan of Care (Signed)

## 2023-05-04 NOTE — Progress Notes (Signed)
Suppository did not produce a bowel movement

## 2023-05-05 ENCOUNTER — Inpatient Hospital Stay (HOSPITAL_COMMUNITY): Payer: Medicare PPO

## 2023-05-05 ENCOUNTER — Other Ambulatory Visit: Payer: Self-pay

## 2023-05-05 LAB — CBC
HCT: 32.9 % — ABNORMAL LOW (ref 39.0–52.0)
Hemoglobin: 11.3 g/dL — ABNORMAL LOW (ref 13.0–17.0)
MCH: 26.5 pg (ref 26.0–34.0)
MCHC: 34.3 g/dL (ref 30.0–36.0)
MCV: 77 fL — ABNORMAL LOW (ref 80.0–100.0)
Platelets: 336 10*3/uL (ref 150–400)
RBC: 4.27 MIL/uL (ref 4.22–5.81)
RDW: 14.4 % (ref 11.5–15.5)
WBC: 7.5 10*3/uL (ref 4.0–10.5)
nRBC: 0 % (ref 0.0–0.2)

## 2023-05-05 LAB — GLUCOSE, CAPILLARY
Glucose-Capillary: 129 mg/dL — ABNORMAL HIGH (ref 70–99)
Glucose-Capillary: 138 mg/dL — ABNORMAL HIGH (ref 70–99)

## 2023-05-05 LAB — BASIC METABOLIC PANEL
Anion gap: 12 (ref 5–15)
BUN: 49 mg/dL — ABNORMAL HIGH (ref 8–23)
CO2: 22 mmol/L (ref 22–32)
Calcium: 8.7 mg/dL — ABNORMAL LOW (ref 8.9–10.3)
Chloride: 102 mmol/L (ref 98–111)
Creatinine, Ser: 2.47 mg/dL — ABNORMAL HIGH (ref 0.61–1.24)
GFR, Estimated: 28 mL/min — ABNORMAL LOW (ref >60)
Glucose, Bld: 144 mg/dL — ABNORMAL HIGH (ref 70–99)
Potassium: 4.7 mmol/L (ref 3.5–5.1)
Sodium: 136 mmol/L (ref 135–145)

## 2023-05-05 MED ORDER — SODIUM CHLORIDE 0.9 % IV SOLN: INTRAVENOUS | Status: AC

## 2023-05-05 MED ORDER — SODIUM CHLORIDE 0.9% FLUSH
10.0000 mL | INTRAVENOUS | Status: DC | PRN
  Administered 2023-05-05: 20 mL

## 2023-05-05 MED ORDER — TRACE MINERALS CU-MN-SE-ZN 300-55-60-3000 MCG/ML IV SOLN
INTRAVENOUS | Status: AC
  Filled 2023-05-05 (×2): qty 1000

## 2023-05-05 MED ORDER — HYDRALAZINE HCL 20 MG/ML IJ SOLN
10.0000 mg | Freq: Four times a day (QID) | INTRAMUSCULAR | Status: DC | PRN
  Administered 2023-05-05 – 2023-05-06 (×2): 10 mg via INTRAVENOUS
  Filled 2023-05-05 (×2): qty 1

## 2023-05-05 MED ORDER — HEPARIN SODIUM (PORCINE) 5000 UNIT/ML IJ SOLN
5000.0000 [IU] | Freq: Three times a day (TID) | INTRAMUSCULAR | Status: DC
  Administered 2023-05-05 – 2023-05-18 (×38): 5000 [IU] via SUBCUTANEOUS
  Filled 2023-05-05 (×40): qty 1

## 2023-05-05 MED ORDER — HYDROMORPHONE HCL 1 MG/ML IJ SOLN
0.5000 mg | INTRAMUSCULAR | Status: DC | PRN
  Administered 2023-05-06 – 2023-05-11 (×10): 1 mg via INTRAVENOUS
  Filled 2023-05-05 (×10): qty 1

## 2023-05-05 MED ORDER — GUAIFENESIN 100 MG/5ML PO LIQD
10.0000 mL | Freq: Four times a day (QID) | ORAL | Status: AC
  Administered 2023-05-05 – 2023-05-06 (×2): 10 mL via ORAL
  Filled 2023-05-05 (×4): qty 10

## 2023-05-05 MED ORDER — DIATRIZOATE MEGLUMINE & SODIUM 66-10 % PO SOLN
90.0000 mL | Freq: Once | ORAL | Status: AC
  Administered 2023-05-05: 90 mL via NASOGASTRIC
  Filled 2023-05-05: qty 90

## 2023-05-05 MED ORDER — SODIUM CHLORIDE 0.9 % IV BOLUS
1000.0000 mL | Freq: Once | INTRAVENOUS | Status: AC
  Administered 2023-05-05: 1000 mL via INTRAVENOUS

## 2023-05-05 MED ORDER — CHLORHEXIDINE GLUCONATE CLOTH 2 % EX PADS
6.0000 | MEDICATED_PAD | Freq: Every day | CUTANEOUS | Status: DC
  Administered 2023-05-05 – 2023-05-18 (×14): 6 via TOPICAL

## 2023-05-05 MED ORDER — THIAMINE HCL 100 MG/ML IJ SOLN
100.0000 mg | Freq: Once | INTRAMUSCULAR | Status: AC
  Administered 2023-05-05: 100 mg via INTRAVENOUS
  Filled 2023-05-05: qty 2

## 2023-05-05 MED ORDER — THIAMINE HCL 100 MG/ML IJ SOLN
100.0000 mg | INTRAMUSCULAR | Status: AC
  Administered 2023-05-06 – 2023-05-10 (×5): 100 mg via INTRAVENOUS
  Filled 2023-05-05 (×5): qty 2

## 2023-05-05 MED ORDER — FAT EMUL FISH OIL/PLANT BASED 20% (SMOFLIPID)IV EMUL
250.0000 mL | INTRAVENOUS | Status: AC
  Administered 2023-05-05: 250 mL via INTRAVENOUS
  Filled 2023-05-05: qty 250

## 2023-05-05 MED ORDER — SODIUM CHLORIDE 0.9% FLUSH
10.0000 mL | Freq: Two times a day (BID) | INTRAVENOUS | Status: DC
  Administered 2023-05-05 – 2023-05-18 (×22): 10 mL

## 2023-05-05 MED ORDER — INSULIN ASPART 100 UNIT/ML IJ SOLN
0.0000 [IU] | INTRAMUSCULAR | Status: DC
  Administered 2023-05-06 (×4): 1 [IU] via SUBCUTANEOUS

## 2023-05-05 NOTE — TOC Initial Note (Signed)
Transition of Care [facility]) - Initial/Assessment Note    Patient Details  Name: Thomas [name] MRN: [id number] Date of Birth: 1956/01/11  Transition of Care [facility]) CM/SW Contact:    Epifanio Lesches, RN Phone Number: 05/05/2023, 9:34 AM  Clinical Narrative:                 Presents for planned  incisional hernia repair surgery.  - 12/20  s/p open incisional hernia repair with biologic mesh, adhesiolysis, small bowel serosal repair x2  -12/25 Abdominal CT  ? small bowel obstruction 12/26 Pharmacy consulted for TPN initiation and management    Pt from home with supportive family ( wife , son and daughter in law). Pt states PTA independent with ADL's. Owns cane, W/C and RW. States lives in a 3 story home with his bedroom on the 1st level.  Pt without transportation issues. Pt without RX med concerns.  TOC team following and will assist with needs...  Expected Discharge Plan: Home/Self Care Barriers to Discharge: Continued Medical Work up   Patient Goals and CMS Choice            Expected Discharge Plan and Services   Discharge Planning Services: CM Consult   Living arrangements for the past 2 months: Single Family Home                     Prior Living Arrangements/Services Living arrangements for the past 2 months: Single Family Home Lives with:: Spouse Patient language and need for interpreter reviewed:: Yes Do you feel safe going back to the place where you live?: Yes      Need for Family Participation in Patient Care: Yes (Comment) Care giver support system in place?: Yes (comment) Current home services: DME (W/C, walker and cane) Criminal Activity/Legal Involvement Pertinent to Current Situation/Hospitalization: No - Comment as needed  Activities of Daily Living   ADL Screening (condition at time of admission) Independently performs ADLs?: Yes (appropriate for developmental age) Is the patient deaf or have difficulty hearing?: No Does the patient have difficulty  seeing, even when wearing glasses/contacts?: No Does the patient have difficulty concentrating, remembering, or making decisions?: No  Permission Sought/Granted                  Emotional Assessment Appearance:: Appears stated age Attitude/Demeanor/Rapport: Gracious Affect (typically observed): Accepting Orientation: : Oriented to Self, Oriented to Place, Oriented to  Time, Oriented to Situation Alcohol / Substance Use: Not Applicable Psych Involvement: No (comment)  Admission diagnosis:  Incisional hernia [K43.2] Patient Active Problem List   Diagnosis Date Noted   Incisional hernia 04/29/2023   Hypomagnesemia 07/26/2022   Status post colostomy takedown 07/26/2022   Ileus (HCC) 07/26/2022   Bilateral pleural effusion 07/26/2022   Hyperglycemia 07/22/2022   Normocytic anemia 07/14/2022   Atelectasis 07/14/2022   Hypophosphatemia 07/08/2022   Hiccoughs 07/08/2022   Insomnia due to medical condition 07/07/2022   Hypokalemia 07/06/2022   CKD (chronic kidney disease) stage 3b 07/06/2022   Delirium 07/05/2022   History of colorectal cancer 07/01/2022   History of meningioma of the brain 06/07/2022   Incisional hernia, without obstruction or gangrene 06/07/2022   Gastroesophageal reflux disease without esophagitis 03/18/2021   Epilepsy (HCC)    Hyperlipidemia    Peripheral neuropathy    Cerebral embolism with cerebral infarction 02/06/2021   Acute esophagitis    Duodenal ulcer disease    Palliative care by specialist    Shock (HCC)    Acute  metabolic encephalopathy 12/04/2020   Protein-calorie malnutrition, moderate (HCC) 12/03/2020   Atrial fibrillation with RVR (HCC)    Sepsis (HCC)    Adverse effect of chemotherapy 12/01/2020   Pressure injury of skin 12/01/2020   Acute renal failure superimposed on stage 2 chronic kidney disease (HCC) 10/01/2020   Hyponatremia 10/01/2020   Iron deficiency anemia due to chronic blood loss 10/01/2020   Colon cancer (HCC)  09/23/2020   History of ischemic left MCA stroke 2015 09/11/2020   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    Rectal polyp    Diverticulosis of colon without hemorrhage    Abdominal pain 09/04/2020   Diabetic polyneuropathy associated with type 2 diabetes mellitus (HCC) 06/28/2018   Venous insufficiency of both lower extremities 06/28/2018   S/P craniotomy 09/01/2016   Trauma    Type 2 diabetes mellitus with diabetic nephropathy, without long-term current use of insulin (HCC)    Hypertension    PCP:  Hillery Aldo, NP Pharmacy:   [facility] [phone] - [place], [place] - [address] AT [facility] OF [address] [place] [address] Phone: [phone] Fax: [phone]     Social Drivers of Health (SDOH) Social History: SDOH Screenings   Food Insecurity: No Food Insecurity (04/29/2023)  Housing: Low Risk  (04/29/2023)  Transportation Needs: No Transportation Needs (04/29/2023)  Utilities: Not At Risk (04/29/2023)  Depression (PHQ2-9): Low Risk  (04/28/2021)  Social Connections: Unknown (09/21/2021)   [facility] from [facility]  Tobacco Use: Medium Risk (04/29/2023)   SDOH Interventions:     Readmission Risk Interventions    07/07/2022    5:56 PM 02/16/2021   10:08 AM  Readmission Risk Prevention Plan  Transportation Screening Complete Complete  PCP or Specialist Appt within 3-5 Days Complete   HRI or Home Care Consult Complete   Social Work Consult for Recovery Care Planning/Counseling Complete   Palliative Care Screening Not Applicable   Medication Review Oceanographer) Complete Complete  PCP or Specialist appointment within 3-5 days of discharge  Complete  HRI or Home Care Consult  Complete  SW Recovery Care/Counseling Consult  Complete  Palliative Care Screening  Complete  Skilled Nursing Facility  Complete

## 2023-05-05 NOTE — Plan of Care (Signed)

## 2023-05-05 NOTE — Progress Notes (Signed)
Pt vomited - green color and undigested food. Paged Trauma team. Dr Melody Haver called back and came to the bedside to assessed pt. New orders placed. Will administer orders and continue to monitor pt. Charma Igo, LPN 29/56/21 3:08 AM

## 2023-05-05 NOTE — Progress Notes (Signed)
Initial Nutrition Assessment  DOCUMENTATION CODES:   Not applicable  INTERVENTION:  TPN for nutrition, management per pharmacy team Energy recommendations can be found below Thiamine 100mg  x 5 days for moderate risk of refeeding  NUTRITION DIAGNOSIS:   Inadequate oral intake related to vomiting as evidenced by NPO status.  GOAL:   Patient will meet greater than or equal to 90% of their needs  MONITOR:   I & O's, Diet advancement, Labs, Weight trends, Other (Comment) (TPN)  REASON FOR ASSESSMENT:   Consult New TPN/TNA  ASSESSMENT:   Pt with hx of HTN, HLD, GERD, DM type 2, CKD, hx colon cancer s/p chemo, partial transverse colectomy and end colostomy 09/10/2020 (takedown 06/2022), presented to ED for planned incisional hernia repair surgery.  12/20 - Op, open incisional hernia repair with biologic mesh, adhesiolysis, small bowel serosal repair x2  12/25 - Abdominal CT scan suspicious for small bowel obstruction  RD working remotely  New consult received for pt for initiation of TPN. PICC placement pending. Pt has not had BM since admission and has developed vomiting. NGT placed this AM with immediately after placement. Imaging suspicious for obstruction. No significant nutrition since admission due to nausea. Pt is at moderate refeeding risk due to prolonged poor PO intake. Discussed recommendations with RPH.    Admit / Current weight: 93 kg  Stable weight x 1 year PTA  Diet Order Hx: 12/20 - clears 12/21 - NPO for surgery then advanced to soft 12/26 - NPO with NGT placement  Nutritionally Relevant Medications: Scheduled Meds:  atorvastatin  20 mg Oral q AM   docusate sodium  100 mg Oral BID   insulin aspart  0-6 Units Subcutaneous Q4H   levETIRAcetam  500 mg Oral BID   pantoprazole  40 mg Oral Daily   polyethylene glycol  17 g Oral BID   thiamine (VITAMIN B1) injection  100 mg Intravenous Once   Continuous Infusions:  sodium chloride 100 mL/hr at 05/05/23  0155   PRN Meds:.diphenhydrAMINE, ondansetron   Labs Reviewed: BUN 49, creatinine 2.47 HgbA1c 5.6%  NUTRITION - FOCUSED PHYSICAL EXAM: Defer to in-person assessment  Diet Order:   Diet Order             Diet NPO time specified  Diet effective now                   EDUCATION NEEDS:   No education needs have been identified at this time  Skin:  Skin Assessment: Reviewed RN Assessment (surgical incisions to the midline)  Last BM:  12/19  Height:  Ht Readings from Last 1 Encounters:  04/29/23 5\' 8"  (1.727 m)    Weight:  Wt Readings from Last 1 Encounters:  04/29/23 93 kg    Ideal Body Weight:  70 kg  BMI:  Body mass index is 31.17 kg/m.  Estimated Nutritional Needs:  Kcal:  2000-2200 kcal/d Protein:  105-120g/d Fluid:  2-2.2 L/d    Greig Castilla, RD, LDN Registered Dietitian II Please reach out via secure chat Weekend on-call pager # available in [facility]

## 2023-05-05 NOTE — Progress Notes (Signed)
Peripherally Inserted Central Catheter Placement  The IV Nurse has discussed with the patient and/or persons authorized to consent for the patient, the purpose of this procedure and the potential benefits and risks involved with this procedure.  The benefits include less needle sticks, lab draws from the catheter, and the patient may be discharged home with the catheter. Risks include, but not limited to, infection, bleeding, blood clot (thrombus formation), and puncture of an artery; nerve damage and irregular heartbeat and possibility to perform a PICC exchange if needed/ordered by physician.  Alternatives to this procedure were also discussed.  Bard Power PICC patient education guide, fact sheet on infection prevention and patient information card has been provided to patient /or left at bedside.    PICC Placement Documentation  PICC Double Lumen 05/05/23 Right Brachial 39 cm 0 cm (Active)  Indication for Insertion or Continuance of Line Administration of hyperosmolar/irritating solutions (i.e. TPN, Vancomycin, etc.) 05/05/23 1235  Exposed Catheter (cm) 0 cm 05/05/23 1235  Site Assessment Clean, Dry, Intact 05/05/23 1235  Lumen #1 Status Flushed;Saline locked;Blood return noted 05/05/23 1235  Lumen #2 Status Flushed;Saline locked;Blood return noted 05/05/23 1235  Dressing Type Transparent;Securing device 05/05/23 1235  Dressing Status Antimicrobial disc in place;Clean, Dry, Intact 05/05/23 1235  Line Care Connections checked and tightened 05/05/23 1235  Line Adjustment (NICU/IV Team Only) No 05/05/23 1235  Dressing Intervention New dressing;Adhesive placed at insertion site (IV team only);Other (Comment) 05/05/23 1235  Dressing Change Due 05/12/23 05/05/23 1235       Annett Fabian 05/05/2023, 12:36 PM

## 2023-05-05 NOTE — Progress Notes (Signed)
Mobility Specialist: Progress Note   05/05/23 1548  Mobility  Activity Ambulated with assistance in hallway  Level of Assistance Standby assist, set-up cues, supervision of patient - no hands on  Assistive Device Other (Comment) (IV pole)  Distance Ambulated (ft) 40 ft  Activity Response Tolerated well  Mobility Referral Yes  Mobility visit 1 Mobility  Mobility Specialist Start Time (ACUTE ONLY) 1355  Mobility Specialist Stop Time (ACUTE ONLY) 1414  Mobility Specialist Time Calculation (min) (ACUTE ONLY) 19 min    Pt was agreeable to mobility session - received in bed. Not feeling well today - c/o chest pain and nausea, no c/o foot pain today. Still very motivated to get up. Returned to room without fault. Left in bed with all needs met, call bell in reach.   Maurene Capes Mobility Specialist Please contact via SecureChat or Rehab office at [phone]

## 2023-05-05 NOTE — Progress Notes (Signed)
PHARMACY - TOTAL PARENTERAL NUTRITION CONSULT NOTE   Indication: Prolonged ileus  Patient Measurements: Height: 5\' 8"  (172.7 cm) Weight: 93 kg (205 lb) IBW/kg (Calculated) : 68.4 TPN AdjBW (KG): 74.5 Body mass index is 31.17 kg/m. Usual Weight: 93 kg- patient reports no weight changes in the past 3 months  Assessment: 27 YOM admitted for a planned lap VHR on 04/29/2023. He is POD#6 with minimal oral intake 2/2 prolonged ileus. He is positive for flatus but has slowing JP drain OP. He has had a couple bouts of green emesis and no BM in 5 days.  Pharmacy was consulted for TPN initiation and management  Glucose / Insulin: History of T2DM; Hgb A1C 5.6 on 04/26/2023. CBG's <180 Electrolytes: Na 136, K 4.7, Ch 102, Ca 8.7 (CoCa 10.14),  Renal: BUN 49, Scr 2.47 Hepatic: Albumin 2.2 (3/24)  Intake / Output; MIVF: UOP not measured accurately (1 unmeasured), Emesis 250 mL, Drain 5 mL GI Imaging: 12/25 CT Abd- probable small bowel obstruction; small pockets of air in anterior adb may be within collapsed loop of small bowel GI Surgeries / Procedures:  12/20 Lap VHR w/ mesh  Central access: 12/26 TPN start date: 12/26  Nutritional Goals: Goal TPN rate is 75 mL/hr (provides 110 g of protein and 2000 kcals per day)  Due to fluid shortages caused by [facility], Clinimix will be utilized to meet his nutritional needs as able.  Electrolytes in 8/10 E 1000 mL: Na 35 mEq, K 30 mEq, Mag 5 mEq, Ca 5 mEq, Phos , Acetate 83, Cl 76 mEq. Each bag will provide 80 grams of protein and 664 Kcal  Electrolytes in 8/10 E 2000 mL NaAcet 30 mEq, NaPhos 40 mEq, Mg 10 mEq, CaGluc 9 mEq, KCl 20 mEq, K Acet 40 mEq Each bag will provide 160 grams of protein and 1325 Kcal  RD Assessment: Estimated Needs Total Energy Estimated Needs: 2000-2200 kcal/d Total Protein Estimated Needs: 105-120g/d Total Fluid Estimated Needs: 2-2.2 L/d  Current Nutrition:  NPO  Plan:  Start Clinimix 8/10 w/ E-lytes at  43mL/hr at 1800 Start SMOF 20.8 ml/hr x12 hours daily starting at 1800 This will provide 80 grams of protein and 1164 Kcal  Patient will likely cycle 1000 ml Clinimix x4 days a week and Clinimix 2000 ml 3 days a week which will provide an average of 114 grams of protein and 1447 Kcal per week  Add standard MVI and trace elements to TPN Initiate Sensitive q4h SSI and adjust as needed to start 12/26 1800 Reduce MIVF to 60 mL/hr at 1800. Ending at 02:29 on 12/27 Monitor TPN labs on Mon/Thurs, Daily CMP, Mag, Phos x3 days (ending 12/29)   [name] BS, PharmD, BCPS Clinical Pharmacist 05/05/2023 9:35 AM  Contact: [phone] after 3 PM  "Be curious, not judgmental..." -Debbora Dus

## 2023-05-05 NOTE — Progress Notes (Addendum)
   Trauma/Critical Care Follow Up Note  Subjective:    Overnight Issues:   Objective:  Vital signs for last 24 hours: Temp:  [98 F (36.7 C)-98.6 F (37 C)] 98 F (36.7 C) (12/26 1356) Pulse Rate:  [91-100] 100 (12/26 1356) Resp:  [17-19] 18 (12/26 1356) BP: (128-167)/(68-78) 167/74 (12/26 1356) SpO2:  [93 %-95 %] 94 % (12/26 1356)  Hemodynamic parameters for last 24 hours:    Intake/Output from previous day: 12/25 0701 - 12/26 0700 In: 120 [P.O.:120] Out: 256 [Urine:1; Emesis/NG output:250; Drains:5]  Intake/Output this shift: Total I/O In: 0  Out: 1055 [Urine:400; Emesis/NG output:650; Drains:5]  Vent settings for last 24 hours:    Physical Exam:  Gen: comfortable, no distress Neuro: follows commands, alert, communicative HEENT: PERRL Neck: supple CV: RRR Pulm: unlabored breathing on RA Abd: soft, appropriately TTP, incision clean, dry, intact, JP SS, +BM GU: urine clear and yellow, +spontaneous voids Extr: wwp, no edema  Results for orders placed or performed during the hospital encounter of 04/29/23 (from the past 24 hours)  CBC     Status: Abnormal   Collection Time: 05/05/23  1:23 AM  Result Value Ref Range   WBC 7.5 4.0 - 10.5 K/uL   RBC 4.27 4.22 - 5.81 MIL/uL   Hemoglobin 11.3 (L) 13.0 - 17.0 g/dL   HCT 95.6 (L) 38.7 - 56.4 %   MCV 77.0 (L) 80.0 - 100.0 fL   MCH 26.5 26.0 - 34.0 pg   MCHC 34.3 30.0 - 36.0 g/dL   RDW 33.2 95.1 - 88.4 %   Platelets 336 150 - 400 K/uL   nRBC 0.0 0.0 - 0.2 %  Basic metabolic panel     Status: Abnormal   Collection Time: 05/05/23  1:23 AM  Result Value Ref Range   Sodium 136 135 - 145 mmol/L   Potassium 4.7 3.5 - 5.1 mmol/L   Chloride 102 98 - 111 mmol/L   CO2 22 22 - 32 mmol/L   Glucose, Bld 144 (H) 70 - 99 mg/dL   BUN 49 (H) 8 - 23 mg/dL   Creatinine, Ser 1.66 (H) 0.61 - 1.24 mg/dL   Calcium 8.7 (L) 8.9 - 10.3 mg/dL   GFR, Estimated 28 (L) >60 mL/min   Anion gap 12 5 - 15    Assessment & Plan: The plan  of care was discussed with the bedside nurse for the day, who is in agreement with this plan and no additional concerns were raised.   Present on Admission:  Incisional hernia    LOS: 6 days   Additional comments:I reviewed the patient's new clinical lab test results.   and I reviewed the patients new imaging test results.    VHR with biologic mesh -+flatus and BM today, incision cdi, JP drain o/p slowing, remove. WBC normalized, continue mobilization and pain control. Path negative.  Ileus vs SBO - NGT in place, SBOP today.  Rising creatinine and hyperkalemia - lokelma x1 dose yest, hydrate FEN -NPO except sips with meds, PICC/TPN today DVT - SCDs, SQH Dispo - med-surg    Diamantina Monks, MD Trauma & General Surgery Please use [email or web address] to contact on call provider  05/05/2023  *Care during the described time interval was provided by me. I have reviewed this patient's available data, including medical history, events of note, physical examination and test results as part of my evaluation.

## 2023-05-05 NOTE — Progress Notes (Signed)
Pt. c/o nausea nurse noted 150 ml green emesis. NG tube marking as in initially placed, suction is on,and no kinks in tubing.

## 2023-05-06 ENCOUNTER — Inpatient Hospital Stay (HOSPITAL_COMMUNITY): Payer: Medicare PPO

## 2023-05-06 LAB — CBC
HCT: 30.2 % — ABNORMAL LOW (ref 39.0–52.0)
Hemoglobin: 10.5 g/dL — ABNORMAL LOW (ref 13.0–17.0)
MCH: 26.7 pg (ref 26.0–34.0)
MCHC: 34.8 g/dL (ref 30.0–36.0)
MCV: 76.8 fL — ABNORMAL LOW (ref 80.0–100.0)
Platelets: 317 10*3/uL (ref 150–400)
RBC: 3.93 MIL/uL — ABNORMAL LOW (ref 4.22–5.81)
RDW: 14.4 % (ref 11.5–15.5)
WBC: 8.6 10*3/uL (ref 4.0–10.5)
nRBC: 0 % (ref 0.0–0.2)

## 2023-05-06 LAB — COMPREHENSIVE METABOLIC PANEL
ALT: 30 U/L (ref 0–44)
AST: 20 U/L (ref 15–41)
Albumin: 2.7 g/dL — ABNORMAL LOW (ref 3.5–5.0)
Alkaline Phosphatase: 53 U/L (ref 38–126)
Anion gap: 10 (ref 5–15)
BUN: 45 mg/dL — ABNORMAL HIGH (ref 8–23)
CO2: 26 mmol/L (ref 22–32)
Calcium: 8.5 mg/dL — ABNORMAL LOW (ref 8.9–10.3)
Chloride: 107 mmol/L (ref 98–111)
Creatinine, Ser: 1.78 mg/dL — ABNORMAL HIGH (ref 0.61–1.24)
GFR, Estimated: 42 mL/min — ABNORMAL LOW (ref >60)
Glucose, Bld: 153 mg/dL — ABNORMAL HIGH (ref 70–99)
Potassium: 3.9 mmol/L (ref 3.5–5.1)
Sodium: 143 mmol/L (ref 135–145)
Total Bilirubin: 0.8 mg/dL (ref <1.2)
Total Protein: 6.6 g/dL (ref 6.5–8.1)

## 2023-05-06 LAB — GLUCOSE, CAPILLARY
Glucose-Capillary: 146 mg/dL — ABNORMAL HIGH (ref 70–99)
Glucose-Capillary: 154 mg/dL — ABNORMAL HIGH (ref 70–99)
Glucose-Capillary: 155 mg/dL — ABNORMAL HIGH (ref 70–99)
Glucose-Capillary: 159 mg/dL — ABNORMAL HIGH (ref 70–99)
Glucose-Capillary: 160 mg/dL — ABNORMAL HIGH (ref 70–99)

## 2023-05-06 LAB — MAGNESIUM: Magnesium: 2.6 mg/dL — ABNORMAL HIGH (ref 1.7–2.4)

## 2023-05-06 LAB — PHOSPHORUS: Phosphorus: 2.4 mg/dL — ABNORMAL LOW (ref 2.5–4.6)

## 2023-05-06 MED ORDER — LACTATED RINGERS IV BOLUS
1000.0000 mL | Freq: Once | INTRAVENOUS | Status: AC
  Administered 2023-05-06: 1000 mL via INTRAVENOUS

## 2023-05-06 MED ORDER — POTASSIUM PHOSPHATES 15 MMOLE/5ML IV SOLN
30.0000 mmol | Freq: Once | INTRAVENOUS | Status: AC
  Administered 2023-05-06: 30 mmol via INTRAVENOUS
  Filled 2023-05-06: qty 10

## 2023-05-06 MED ORDER — LACTATED RINGERS IV SOLN: INTRAVENOUS | Status: AC

## 2023-05-06 MED ORDER — LACTATED RINGERS IV BOLUS: 500.0000 mL | Freq: Once | INTRAVENOUS | Status: DC

## 2023-05-06 MED ORDER — TRACE MINERALS CU-MN-SE-ZN 300-55-60-3000 MCG/ML IV SOLN
INTRAVENOUS | Status: AC
  Filled 2023-05-06 (×2): qty 1488

## 2023-05-06 MED ORDER — LACTATED RINGERS IV BOLUS: 1000.0000 mL | Freq: Once | INTRAVENOUS | Status: DC

## 2023-05-06 MED ORDER — FAT EMUL FISH OIL/PLANT BASED 20% (SMOFLIPID)IV EMUL
250.0000 mL | INTRAVENOUS | Status: AC
Start: 2023-05-06 — End: 2023-05-07
  Administered 2023-05-06: 250 mL via INTRAVENOUS
  Filled 2023-05-06: qty 250

## 2023-05-06 NOTE — Progress Notes (Signed)
NG advanced 15 cm with repeat abdomen Xray per MD. Bedelia Person verbal order

## 2023-05-06 NOTE — Progress Notes (Signed)
Physical Therapy Treatment Patient Details Name: Thomas [name] MRN: [id number] DOB: 1956/03/11 Today's Date: 05/06/2023   History of Present Illness The pt is a 67 yo male presenting 12/20 for repair of incisional hernia from colostomy takedown 06/2022 which was complicated by prolonged postop ileus. PMH includes: OSA not on CPAP, seizures, brain tumor s/p resection 2018, peripheral edema, non-insulin-dependent DM2, HTN, GERD, peripheral neuropathy, colon cancer (s/p partial transverse colectomy and end colostomy 09/10/2020 followed by chemotherapy).    PT Comments  Pt remains painful but more motivated to mobilize this pm.  He required cues for forward gaze and upper trunk control as well as increasing B stride length and foot clearance.  Pt reports feeling better post gt training.  Will continue to follow.  In chair resting with CNA at conclusion of tx for a bath.      If plan is discharge home, recommend the following: Help with stairs or ramp for entrance;Assist for transportation   Can travel by private vehicle        Equipment Recommendations  None recommended by PT    Recommendations for Other Services       Precautions / Restrictions Precautions Precautions: Fall Precaution Comments: abdominal surgery, abdominal binder Restrictions Weight Bearing Restrictions Per Provider Order: No     Mobility  Bed Mobility Overal bed mobility: Modified Independent             General bed mobility comments: cues for log roll, no assist    Transfers Overall transfer level: Needs assistance Equipment used: None Transfers: Sit to/from Stand Sit to Stand: Supervision           General transfer comment: no assist or UE support    Ambulation/Gait Ambulation/Gait assistance: Supervision Gait Distance (Feet): 150 Feet Assistive device: IV Pole Gait Pattern/deviations: Step-through pattern, Decreased stride length, Trunk flexed, Shuffle       General Gait Details: Cues for  trunk control and forward gaze.  Cues to increase B stride length and foot clearance.   Stairs             Wheelchair Mobility     Tilt Bed    Modified Rankin (Stroke Patients Only)       Balance Overall balance assessment: No apparent balance deficits (not formally assessed)                                          Cognition Arousal: Alert Behavior During Therapy: WFL for tasks assessed/performed, Flat affect Overall Cognitive Status: Within Functional Limits for tasks assessed                                          Exercises      General Comments        Pertinent Vitals/Pain Pain Assessment Pain Assessment: 0-10 Pain Score: 7  Pain Location: abdomen>feet Pain Descriptors / Indicators: Stabbing (reports pain in feet as nerve pain from his neuropathy, put on his shoes and this reduced pain.) Pain Intervention(s): Repositioned    Home Living                          Prior Function            PT Goals (current goals can now  be found in the care plan section) Acute Rehab PT Goals Potential to Achieve Goals: Good Progress towards PT goals: Progressing toward goals    Frequency    Min 1X/week      PT Plan      Co-evaluation              AM-PAC PT "6 Clicks" Mobility   Outcome Measure  Help needed turning from your back to your side while in a flat bed without using bedrails?: None Help needed moving from lying on your back to sitting on the side of a flat bed without using bedrails?: A Little Help needed moving to and from a bed to a chair (including a wheelchair)?: A Little Help needed standing up from a chair using your arms (e.g., wheelchair or bedside chair)?: A Little Help needed to walk in hospital room?: A Little Help needed climbing 3-5 steps with a railing? : A Little 6 Click Score: 19    End of Session Equipment Utilized During Treatment: Gait belt Activity Tolerance: Patient  limited by pain Patient left: in chair;with call bell/phone within reach Nurse Communication: Mobility status (to reconnect NG tube to intermittent suction') PT Visit Diagnosis: Other abnormalities of gait and mobility (R26.89);Muscle weakness (generalized) (M62.81);Pain Pain - Right/Left: Left Pain - part of body: Leg     Time: 1610-9604 PT Time Calculation (min) (ACUTE ONLY): 19 min  Charges:    $Gait Training: 8-22 mins PT General Charges $$ ACUTE PT VISIT: 1 Visit                     Bonney Leitz , PTA Acute Rehabilitation Services Office [phone]    Florestine Avers 05/06/2023, 3:25 PM

## 2023-05-06 NOTE — Plan of Care (Signed)

## 2023-05-06 NOTE — Progress Notes (Signed)
Mobility Specialist Progress Note:    05/06/23 1200  Mobility  Activity Stood at bedside;Dangled on edge of bed  Level of Assistance Contact guard assist, steadying assist  Assistive Device None  Activity Response Tolerated well  Mobility Referral Yes  Mobility visit 1 Mobility  Mobility Specialist Start Time (ACUTE ONLY) 1110  Mobility Specialist Stop Time (ACUTE ONLY) 1120  Mobility Specialist Time Calculation (min) (ACUTE ONLY) 10 min   Pt received in bed, agreeable to mobility. Ambulation deferred d/t fatigue. Pt able to come EOB and perform seated marches, knee flexion/extension, arm raises, and x5 STS. No complaints throughout. Pt left in bed with call bell and all needs met.  D'Vante Earlene Plater Mobility Specialist Please contact via Special educational needs teacher or Rehab office at [phone]

## 2023-05-06 NOTE — Progress Notes (Addendum)
PHARMACY - TOTAL PARENTERAL NUTRITION CONSULT NOTE   Indication: Prolonged ileus  Patient Measurements: Height: 5\' 8"  (172.7 cm) Weight: 93 kg (205 lb) IBW/kg (Calculated) : 68.4 TPN AdjBW (KG): 74.5 Body mass index is 31.17 kg/m. Usual Weight: 93 kg- patient reports no weight changes in the past 3 months  Assessment: 89 YOM admitted for a planned lap VHR on 04/29/2023. He is POD#6 with minimal oral intake 2/2 prolonged ileus. He is positive for flatus but has slowing JP drain OP. He has had a couple bouts of green emesis and no BM in 5 days.  Pharmacy was consulted for TPN initiation and management  Glucose / Insulin: History of T2DM; Hgb A1C 5.6 on 04/26/2023. CBG's <180; 1 unit insulin given Electrolytes: Na 143, K 3.9, Ch 107, Ca 8.5 (CoCa 9.54), Mag 2.6, Phos 2.4 Renal: BUN 45, Scr 1.78 Hepatic: Albumin 2.7, Alk Phos /ASL/ALT/Tbili wnl Intake / Output; MIVF: UOP 0.5 ml/kg/hr , Emesis 150 mL, Drain 10 mL, NG 2100 mL, LBM 12/19 GI Imaging: 12/25 CT Abd- probable small bowel obstruction; small pockets of air in anterior adb may be within collapsed loop of small bowel 12/26 DG ABD- persistent small bowel dilatation noted likely related to postop ileus GI Surgeries / Procedures:  12/20 Lap VHR w/ mesh  Central access: 12/26 TPN start date: 12/26  Nutritional Goals: Goal TPN rate is 75 mL/hr (provides 110 g of protein and 2000 kcals per day)  Due to fluid shortages caused by [facility], Clinimix will be utilized to meet his nutritional needs as able.  Electrolytes in 8/10 E 1000 mL: back order- unable to utilize  Electrolytes in 8/10 E 2000 mL NaAcet 30 mEq, NaPhos 40 mEq, Mg 10 mEq, CaGluc 9 mEq, KCl 20 mEq, K Acet 40 mEq Each bag will provide 160 grams of protein and 1325 Kcal  RD Assessment: Estimated Needs Total Energy Estimated Needs: 2000-2200 kcal/d Total Protein Estimated Needs: 105-120g/d Total Fluid Estimated Needs: 2-2.2 L/d  Current Nutrition:   NPO  Plan:  Start Clinimix 8/10 w/o E-lytes at 62 mL/hr at 1800 Start SMOF 20.8 ml/hr x12 hours daily starting at 1800 This will provide 119 grams of protein and 1436 Kcal Kphos 30 mmol iv x1 Add standard MVI and trace elements to TPN Initiate Sensitive q4h SSI and adjust as needed to start 12/26 1800 Monitor TPN labs on Mon/Thurs, Daily CMP, Mag, Phos x3 days (ending 12/29)   [name] BS, PharmD, BCPS Clinical Pharmacist 05/06/2023 6:52 AM  Contact: [phone] after 3 PM  "Be curious, not judgmental..." -Debbora Dus

## 2023-05-06 NOTE — Progress Notes (Signed)
General Surgery Follow Up Note  Subjective:    Overnight Issues:   Objective:  Vital signs for last 24 hours: Temp:  [98 F (36.7 C)-98.4 F (36.9 C)] 98.1 F (36.7 C) (12/27 0411) Pulse Rate:  [86-100] 86 (12/27 0411) Resp:  [18] 18 (12/27 0411) BP: (167-181)/(71-74) 181/71 (12/27 0411) SpO2:  [94 %-95 %] 95 % (12/27 0411)  Hemodynamic parameters for last 24 hours:    Intake/Output from previous day: 12/26 0701 - 12/27 0700 In: 20 [I.V.:20] Out: 3410 [Urine:1150; Emesis/NG output:2250; Drains:10]  Intake/Output this shift: No intake/output data recorded.  Vent settings for last 24 hours:    Physical Exam:  Gen: comfortable, no distress Neuro: follows commands, alert, communicative HEENT: PERRL Neck: supple CV: RRR Pulm: unlabored breathing on RA Abd: soft, NT, incision clean, dry, intact, JP SS GU: urine clear and yellow, +spontaneous void Extr: wwp, no edema  Results for orders placed or performed during the hospital encounter of 04/29/23 (from the past 24 hours)  Glucose, capillary     Status: Abnormal   Collection Time: 05/05/23  6:25 PM  Result Value Ref Range   Glucose-Capillary 129 (H) 70 - 99 mg/dL  Glucose, capillary     Status: Abnormal   Collection Time: 05/05/23  8:51 PM  Result Value Ref Range   Glucose-Capillary 138 (H) 70 - 99 mg/dL  Glucose, capillary     Status: Abnormal   Collection Time: 05/06/23 12:03 AM  Result Value Ref Range   Glucose-Capillary 154 (H) 70 - 99 mg/dL  CBC     Status: Abnormal   Collection Time: 05/06/23  4:02 AM  Result Value Ref Range   WBC 8.6 4.0 - 10.5 K/uL   RBC 3.93 (L) 4.22 - 5.81 MIL/uL   Hemoglobin 10.5 (L) 13.0 - 17.0 g/dL   HCT 16.1 (L) 09.6 - 04.5 %   MCV 76.8 (L) 80.0 - 100.0 fL   MCH 26.7 26.0 - 34.0 pg   MCHC 34.8 30.0 - 36.0 g/dL   RDW 40.9 81.1 - 91.4 %   Platelets 317 150 - 400 K/uL   nRBC 0.0 0.0 - 0.2 %  Comprehensive metabolic panel     Status: Abnormal   Collection Time: 05/06/23  4:02  AM  Result Value Ref Range   Sodium 143 135 - 145 mmol/L   Potassium 3.9 3.5 - 5.1 mmol/L   Chloride 107 98 - 111 mmol/L   CO2 26 22 - 32 mmol/L   Glucose, Bld 153 (H) 70 - 99 mg/dL   BUN 45 (H) 8 - 23 mg/dL   Creatinine, Ser 7.82 (H) 0.61 - 1.24 mg/dL   Calcium 8.5 (L) 8.9 - 10.3 mg/dL   Total Protein 6.6 6.5 - 8.1 g/dL   Albumin 2.7 (L) 3.5 - 5.0 g/dL   AST 20 15 - 41 U/L   ALT 30 0 - 44 U/L   Alkaline Phosphatase 53 38 - 126 U/L   Total Bilirubin 0.8 <1.2 mg/dL   GFR, Estimated 42 (L) >60 mL/min   Anion gap 10 5 - 15  Magnesium     Status: Abnormal   Collection Time: 05/06/23  4:02 AM  Result Value Ref Range   Magnesium 2.6 (H) 1.7 - 2.4 mg/dL  Phosphorus     Status: Abnormal   Collection Time: 05/06/23  4:02 AM  Result Value Ref Range   Phosphorus 2.4 (L) 2.5 - 4.6 mg/dL  Glucose, capillary     Status: Abnormal  Collection Time: 05/06/23  4:08 AM  Result Value Ref Range   Glucose-Capillary 146 (H) 70 - 99 mg/dL    Assessment & Plan: The plan of care was discussed with the bedside nurse for the day, who is in agreement with this plan and no additional concerns were raised.   Present on Admission:  Incisional hernia    LOS: 7 days   Additional comments:I reviewed the patient's new clinical lab test results.   and I reviewed the patients new imaging test results.    VHR with biologic mesh -+flatus today and small BM yest, incision cdi, JP drain o/p slowing, remove. WBC normalized, continue mobilization and pain control. Path negative.  Ileus vs SBO - NGT in place, SBOP yest, XR abd today AKI - resolving, hydrate FEN -NPO except sips with meds, PICC/TPN, replete hypophosphatemia DVT - SCDs, SQH Dispo - med-surg   Diamantina Monks, MD Trauma & General Surgery Please use [email or web address] to contact on call provider  05/06/2023  *Care during the described time interval was provided by me. I have reviewed this patient's available data, including medical history, events  of note, physical examination and test results as part of my evaluation.

## 2023-05-07 ENCOUNTER — Inpatient Hospital Stay (HOSPITAL_COMMUNITY): Payer: Medicare PPO

## 2023-05-07 LAB — COMPREHENSIVE METABOLIC PANEL
ALT: 24 U/L (ref 0–44)
AST: 15 U/L (ref 15–41)
Albumin: 2.7 g/dL — ABNORMAL LOW (ref 3.5–5.0)
Alkaline Phosphatase: 50 U/L (ref 38–126)
Anion gap: 10 (ref 5–15)
BUN: 43 mg/dL — ABNORMAL HIGH (ref 8–23)
CO2: 24 mmol/L (ref 22–32)
Calcium: 8.5 mg/dL — ABNORMAL LOW (ref 8.9–10.3)
Chloride: 109 mmol/L (ref 98–111)
Creatinine, Ser: 1.67 mg/dL — ABNORMAL HIGH (ref 0.61–1.24)
GFR, Estimated: 45 mL/min — ABNORMAL LOW (ref >60)
Glucose, Bld: 158 mg/dL — ABNORMAL HIGH (ref 70–99)
Potassium: 3.8 mmol/L (ref 3.5–5.1)
Sodium: 143 mmol/L (ref 135–145)
Total Bilirubin: 0.9 mg/dL (ref <1.2)
Total Protein: 6.4 g/dL — ABNORMAL LOW (ref 6.5–8.1)

## 2023-05-07 LAB — GLUCOSE, CAPILLARY
Glucose-Capillary: 110 mg/dL — ABNORMAL HIGH (ref 70–99)
Glucose-Capillary: 112 mg/dL — ABNORMAL HIGH (ref 70–99)
Glucose-Capillary: 132 mg/dL — ABNORMAL HIGH (ref 70–99)
Glucose-Capillary: 134 mg/dL — ABNORMAL HIGH (ref 70–99)
Glucose-Capillary: 143 mg/dL — ABNORMAL HIGH (ref 70–99)
Glucose-Capillary: 145 mg/dL — ABNORMAL HIGH (ref 70–99)
Glucose-Capillary: 149 mg/dL — ABNORMAL HIGH (ref 70–99)

## 2023-05-07 LAB — CBC
HCT: 28.7 % — ABNORMAL LOW (ref 39.0–52.0)
Hemoglobin: 9.8 g/dL — ABNORMAL LOW (ref 13.0–17.0)
MCH: 26.7 pg (ref 26.0–34.0)
MCHC: 34.1 g/dL (ref 30.0–36.0)
MCV: 78.2 fL — ABNORMAL LOW (ref 80.0–100.0)
Platelets: 317 10*3/uL (ref 150–400)
RBC: 3.67 MIL/uL — ABNORMAL LOW (ref 4.22–5.81)
RDW: 14.5 % (ref 11.5–15.5)
WBC: 10.5 10*3/uL (ref 4.0–10.5)
nRBC: 0.3 % — ABNORMAL HIGH (ref 0.0–0.2)

## 2023-05-07 LAB — PHOSPHORUS: Phosphorus: 2.5 mg/dL (ref 2.5–4.6)

## 2023-05-07 LAB — MAGNESIUM: Magnesium: 2.2 mg/dL (ref 1.7–2.4)

## 2023-05-07 MED ORDER — IOHEXOL 350 MG/ML SOLN
60.0000 mL | Freq: Once | INTRAVENOUS | Status: AC | PRN
  Administered 2023-05-07: 60 mL via INTRAVENOUS

## 2023-05-07 MED ORDER — LACTATED RINGERS IV BOLUS
500.0000 mL | Freq: Once | INTRAVENOUS | Status: AC
  Administered 2023-05-07: 500 mL via INTRAVENOUS

## 2023-05-07 MED ORDER — TRACE MINERALS CU-MN-SE-ZN 300-55-60-3000 MCG/ML IV SOLN
INTRAVENOUS | Status: AC
  Filled 2023-05-07 (×2): qty 1488

## 2023-05-07 MED ORDER — POTASSIUM PHOSPHATES 15 MMOLE/5ML IV SOLN
15.0000 mmol | Freq: Once | INTRAVENOUS | Status: AC
  Administered 2023-05-07: 15 mmol via INTRAVENOUS
  Filled 2023-05-07: qty 5

## 2023-05-07 MED ORDER — POTASSIUM CHLORIDE 10 MEQ/50ML IV SOLN
10.0000 meq | INTRAVENOUS | Status: AC
  Filled 2023-05-07 (×2): qty 50

## 2023-05-07 MED ORDER — LACTATED RINGERS IV BOLUS
1000.0000 mL | Freq: Once | INTRAVENOUS | Status: AC
  Administered 2023-05-07: 1000 mL via INTRAVENOUS

## 2023-05-07 MED ORDER — IOHEXOL 9 MG/ML PO SOLN
ORAL | Status: AC
  Filled 2023-05-07: qty 1000

## 2023-05-07 MED ORDER — POTASSIUM CHLORIDE 10 MEQ/50ML IV SOLN
10.0000 meq | INTRAVENOUS | Status: AC
  Administered 2023-05-07 (×2): 10 meq via INTRAVENOUS
  Filled 2023-05-07 (×2): qty 50

## 2023-05-07 MED ORDER — FAT EMUL FISH OIL/PLANT BASED 20% (SMOFLIPID)IV EMUL
250.0000 mL | INTRAVENOUS | Status: AC
  Administered 2023-05-07: 250 mL via INTRAVENOUS
  Filled 2023-05-07: qty 250

## 2023-05-07 NOTE — Progress Notes (Signed)
8 Days Post-Op  Subjective: CC: Feels rejuvenated today Having some pain near his incision and right lateral. No nausea. Passing flatus. No BM. NGT w/ 2040ml/24 hours. He reports he is taking sips of water with meds. He is unable to quantify how much. NGT appeared to be in stomach on xray yesterday. Mobilizing in the halls. No nausea when ngt clamped for ambulation. Voiding without issues.   Objective: Vital signs in last 24 hours: Temp:  [97.8 F (36.6 C)-98.4 F (36.9 C)] 98.4 F (36.9 C) (12/28 0744) Pulse Rate:  [86-100] 86 (12/28 0744) Resp:  [17-19] 17 (12/28 0744) BP: (156-184)/(58-84) 156/58 (12/28 0744) SpO2:  [94 %-100 %] 94 % (12/28 0744) Weight:  [93.8 kg] 93.8 kg (12/28 0353) Last BM Date : 04/28/23  Intake/Output from previous day: 12/27 0701 - 12/28 0700 In: 120 [P.O.:120] Out: 3050 [Urine:1000; Emesis/NG output:2050] Intake/Output this shift: No intake/output data recorded.  PE: Gen:  Alert, NAD, pleasant Card:  Reg Pulm:  Rate and effort normal Abd: Soft, mild to moderate distension, ttp over incision and right lateral of incision. No rigidity or guarding and otherwise NT, Hypoactive BS. Midline incision with some fullness/firmness R lateral of incision without overlying skin changes - otherwise incision with staples intact appears well and are without drainage, bleeding, or signs of infection. Drain bloody SS. NGT bilious.  Ext:  No LE edema  Psych: A&Ox3   Lab Results:  Recent Labs    05/06/23 0402 05/07/23 0106  WBC 8.6 10.5  HGB 10.5* 9.8*  HCT 30.2* 28.7*  PLT 317 317   BMET Recent Labs    05/06/23 0402 05/07/23 0106  NA 143 143  K 3.9 3.8  CL 107 109  CO2 26 24  GLUCOSE 153* 158*  BUN 45* 43*  CREATININE 1.78* 1.67*  CALCIUM 8.5* 8.5*   PT/INR No results for input(s): "LABPROT", "INR" in the last 72 hours. CMP     Component Value Date/Time   NA 143 05/07/2023 0106   K 3.8 05/07/2023 0106   CL 109 05/07/2023 0106   CO2 24  05/07/2023 0106   GLUCOSE 158 (H) 05/07/2023 0106   BUN 43 (H) 05/07/2023 0106   CREATININE 1.67 (H) 05/07/2023 0106   CREATININE 1.80 (H) 10/09/2021 1135   CALCIUM 8.5 (L) 05/07/2023 0106   PROT 6.4 (L) 05/07/2023 0106   ALBUMIN 2.7 (L) 05/07/2023 0106   AST 15 05/07/2023 0106   AST 13 (L) 04/14/2021 1248   ALT 24 05/07/2023 0106   ALT 14 04/14/2021 1248   ALKPHOS 50 05/07/2023 0106   BILITOT 0.9 05/07/2023 0106   BILITOT 0.4 04/14/2021 1248   GFRNONAA 45 (L) 05/07/2023 0106   GFRNONAA 42 (L) 10/09/2021 1135   GFRAA >60 04/01/2017 1921   Lipase     Component Value Date/Time   LIPASE 20 09/29/2021 0606    Studies/Results: DG Abd 1 View Result Date: 05/06/2023 CLINICAL DATA:  [id number] Encounter for nasogastric (NG) tube placement [id number] EXAM: ABDOMEN - 1 VIEW COMPARISON:  Earlier film of the same day FINDINGS: Gastric tube loops in the decompressed stomach. A few gas distended bowel loops in the visualized upper abdomen. Lower abdomen excluded. IMPRESSION: Gastric tube loops in the decompressed stomach. Electronically Signed   By: Corlis Leak M.D.   On: 05/06/2023 15:14   DG Abd 1 View Result Date: 05/06/2023 CLINICAL DATA:  67 year old male with postoperative abdominal pain, obstruction or ileus. EXAM: ABDOMEN - 1 VIEW COMPARISON:  Abdominal radiographs  yesterday. CT Abdomen and Pelvis 05/04/2023. FINDINGS: Portable AP supine view at 0911 hours. Enteric tube now terminates in the proximal stomach, the side hole may be at the distal esophagus. Small volume of contrast in the gastric fundus has decreased since yesterday. Otherwise stable bowel gas pattern since the CT on 05/04/2023, with gas distended small and large bowel loops. Superficial mid abdominal wall drain remains in place. Midline skin staples. No pneumoperitoneum evident on these supine views. Lung bases appear stable. Stable visualized osseous structures. IMPRESSION: 1. Enteric tube terminates in the stomach but side hole may  be in the distal esophagus. Recommend advancing 6 cm to ensure side hole placement within the stomach. 2. Bowel-gas pattern not significantly improved since the CT on 05/04/2023. Favor ileus over mechanical bowel obstruction. Electronically Signed   By: Odessa Fleming M.D.   On: 05/06/2023 10:38   DG Abd Portable 1V-Small Bowel Obstruction Protocol-initial, 8 hr delay Result Date: 05/05/2023 CLINICAL DATA:  Follow up small bowel obstruction EXAM: PORTABLE ABDOMEN - 1 VIEW COMPARISON:  Film from earlier in the same day. FINDINGS: Gastric catheter is noted within the stomach. Contrast is noted within the stomach. Multiple dilated loops of small bowel are seen. No significant small bowel contrast is noted. IMPRESSION: Administered contrast lies within the stomach. Persistent small bowel dilatation is noted likely related to a postop ileus. Electronically Signed   By: Alcide Clever M.D.   On: 05/05/2023 20:23   DG Abd Portable 1V-Small Bowel Protocol-Position Verification Result Date: 05/05/2023 CLINICAL DATA:  Nasogastric tube placement. EXAM: PORTABLE ABDOMEN - 1 VIEW COMPARISON:  Same day. FINDINGS: Distal tip of nasogastric tube is seen in proximal stomach. IMPRESSION: Distal tip of nasogastric tube seen in proximal stomach. Electronically Signed   By: Lupita Raider M.D.   On: 05/05/2023 10:11   [place] EKG SITE RITE Result Date: 05/05/2023 If Site Rite image not attached, placement could not be confirmed due to current cardiac rhythm.   Anti-infectives: Anti-infectives (From admission, onward)    Start     Dose/Rate Route Frequency Ordered Stop   04/29/23 1205  ceFAZolin (ANCEF) 2-4 GM/100ML-% IVPB       Note to Pharmacy: Dorann Ou N: cabinet override      04/29/23 1205 04/29/23 1409   04/29/23 1145  ceFAZolin (ANCEF) IVPB 2g/100 mL premix        2 g 200 mL/hr over 30 Minutes Intravenous On call to O.R. 04/29/23 1140 04/29/23 1438        Assessment/Plan POD 8 s/p open incisional hernia  repair with biologic mesh, adhesiolysis, small bowel serosal repair x2 by Dr. Bedelia Person on 04/29/23 - Cont NPO, NGT to LIWS. Discussed w/ RN strict I/O  - Dr. Bedelia Person has ordered CT A/P w/ PO (via NGT - discussed w/ RN) and IV contrast today. Follow up on results - Keep drain in until CT scan results. Output has tapered off. May be able to be removed after CT. - Mobilize, Pulm toilet - Home meds  FEN - NPO, NGT to LIWS, TPN VTE - SCDs, SQH ID - Ancef peri-op, none currently Foley - None, good uop at 0.6 ml/kg/hr over the last 12 hours.  Plan - CT   LOS: 8 days    Jacinto Halim , [facility] Surgery 05/07/2023, 8:08 AM Please see Amion for pager number during day hours 7:00am-4:30pm

## 2023-05-07 NOTE — Progress Notes (Signed)
Patient's NG tube came out. Sent message to Dr. Bedelia Person. Patient requested that it not be put back in for a few hours. Per secure chat with Dr. Bedelia Person ok to leave the NG tube out tonight.

## 2023-05-07 NOTE — Progress Notes (Signed)
PHARMACY - TOTAL PARENTERAL NUTRITION CONSULT NOTE   Indication: Prolonged ileus  Patient Measurements: Height: 5\' 8"  (172.7 cm) Weight: 93.8 kg (206 lb 12.7 oz) IBW/kg (Calculated) : 68.4 TPN AdjBW (KG): 74.5 Body mass index is 31.44 kg/m. Usual Weight: 93 kg- patient reports no weight changes in the past 3 months  Assessment: 25 YOM admitted for a planned lap VHR on 04/29/2023. Pt with minimal oral intake 2/2 prolonged ileus. He is positive for flatus but has slowing JP drain OP. He has had a couple bouts of green emesis and no BM in 5 days. Pharmacy was consulted for TPN initiation and management.   Glucose / Insulin: History of T2DM; Hgb A1C 5.6 on 04/26/2023. CBG's <180; 3 units insulin given Electrolytes: K 3.8 (would like >/= 4 with ileus), other lytes wnl Renal: BUN 40s, Scr 1.67 Hepatic: Albumin 2.7, LFTs wnl Intake / Output; MIVF: UOP 0.4 ml/kg/hr, NG 1950 mL, LBM 12/19 GI Imaging: 12/25 CT Abd- probable small bowel obstruction; small pockets of air in anterior adb may be within collapsed loop of small bowel 12/26 DG ABD- persistent small bowel dilatation noted likely related to postop ileus 12/27 DG Abd - Bowel-gas pattern not significantly improved since the CT on 05/04/2023. Favor ileus over mechanical bowel obstruction. GI Surgeries / Procedures:  12/20 Lap VHR w/ mesh  Central access: 12/26 TPN start date: 12/26  Nutritional Goals: Goal TPN rate is 62 mL/hr  + SMOF lipid at 20.42ml/hr x 12 hours provides 120 g of protein and kcals per day)  Due to fluid shortages caused by [facility] Clinimix will be utilized to meet his nutritional needs as able.  RD Assessment: Estimated Needs Total Energy Estimated Needs: 2000-2200 kcal/d Total Protein Estimated Needs: 105-120g/d Total Fluid Estimated Needs: 2-2.2 L/d  Current Nutrition:  NPO TPN Clinimix 8/10 at 62 ml/hr + SMOF lipids (20.60ml/hr x 12 hrs) provides 119 gm protein and 1482 kcal   Plan:  Continue  Clinimix 8/10 w/o E-lytes at 62 mL/hr with SMOF lipid 20.8 ml/hr x12 hours daily. This will provide 119 grams of protein and 1482 Kcal (meeting 100% of estimated protein needs and 74% of kcal needs). Kphos 15 mmol IV KCl IV x 2 runs Add standard MVI and trace elements to TPN Thiamine 100mg  IV x 5 days  Continue Sensitive q4h SSI and adjust as needed Monitor TPN labs on Mon/Thurs. Daily CMP, Mag, Phos x3 days (ending 12/29)  Christoper Fabian, PharmD, BCPS Please see amion for complete clinical pharmacist phone list 05/07/2023 7:08 AM

## 2023-05-07 NOTE — Progress Notes (Signed)
   05/07/23 2006  Provider Notification  Provider Name/Title A.Lovick  Date Provider Notified 05/07/23  Time Provider Notified 2006  Method of Notification Page  Notification Reason Other (Comment) (pt requested clear diet since ng tube is out. provider responded "no". pt made aware.)  Provider response No new orders (provider responded "no")  Date of Provider Response 05/07/23  Time of Provider Response 2010

## 2023-05-07 NOTE — Progress Notes (Signed)
Patient given two bottles of Omnipaque contrast one hour apart. Second bottle administered at 12:35 and CT was contacted to advise.

## 2023-05-08 LAB — GLUCOSE, CAPILLARY
Glucose-Capillary: 101 mg/dL — ABNORMAL HIGH (ref 70–99)
Glucose-Capillary: 110 mg/dL — ABNORMAL HIGH (ref 70–99)
Glucose-Capillary: 110 mg/dL — ABNORMAL HIGH (ref 70–99)
Glucose-Capillary: 111 mg/dL — ABNORMAL HIGH (ref 70–99)
Glucose-Capillary: 112 mg/dL — ABNORMAL HIGH (ref 70–99)
Glucose-Capillary: 120 mg/dL — ABNORMAL HIGH (ref 70–99)

## 2023-05-08 LAB — COMPREHENSIVE METABOLIC PANEL
ALT: 19 U/L (ref 0–44)
AST: 15 U/L (ref 15–41)
Albumin: 2.6 g/dL — ABNORMAL LOW (ref 3.5–5.0)
Alkaline Phosphatase: 44 U/L (ref 38–126)
Anion gap: 9 (ref 5–15)
BUN: 39 mg/dL — ABNORMAL HIGH (ref 8–23)
CO2: 23 mmol/L (ref 22–32)
Calcium: 8.4 mg/dL — ABNORMAL LOW (ref 8.9–10.3)
Chloride: 109 mmol/L (ref 98–111)
Creatinine, Ser: 1.56 mg/dL — ABNORMAL HIGH (ref 0.61–1.24)
GFR, Estimated: 49 mL/min — ABNORMAL LOW (ref >60)
Glucose, Bld: 111 mg/dL — ABNORMAL HIGH (ref 70–99)
Potassium: 3.8 mmol/L (ref 3.5–5.1)
Sodium: 141 mmol/L (ref 135–145)
Total Bilirubin: 0.8 mg/dL (ref <1.2)
Total Protein: 6.3 g/dL — ABNORMAL LOW (ref 6.5–8.1)

## 2023-05-08 LAB — CBC
HCT: 27 % — ABNORMAL LOW (ref 39.0–52.0)
Hemoglobin: 9.3 g/dL — ABNORMAL LOW (ref 13.0–17.0)
MCH: 27 pg (ref 26.0–34.0)
MCHC: 34.4 g/dL (ref 30.0–36.0)
MCV: 78.5 fL — ABNORMAL LOW (ref 80.0–100.0)
Platelets: 282 10*3/uL (ref 150–400)
RBC: 3.44 MIL/uL — ABNORMAL LOW (ref 4.22–5.81)
RDW: 14.6 % (ref 11.5–15.5)
WBC: 14 10*3/uL — ABNORMAL HIGH (ref 4.0–10.5)
nRBC: 0 % (ref 0.0–0.2)

## 2023-05-08 LAB — PHOSPHORUS: Phosphorus: 2.2 mg/dL — ABNORMAL LOW (ref 2.5–4.6)

## 2023-05-08 LAB — MAGNESIUM: Magnesium: 1.8 mg/dL (ref 1.7–2.4)

## 2023-05-08 MED ORDER — POTASSIUM PHOSPHATES 15 MMOLE/5ML IV SOLN
30.0000 mmol | Freq: Once | INTRAVENOUS | Status: AC
  Administered 2023-05-08: 30 mmol via INTRAVENOUS
  Filled 2023-05-08: qty 10

## 2023-05-08 MED ORDER — INFUVITE ADULT IV SOLN
INTRAVENOUS | Status: AC
  Filled 2023-05-08 (×2): qty 1488

## 2023-05-08 MED ORDER — FAT EMUL FISH OIL/PLANT BASED 20% (SMOFLIPID)IV EMUL
250.0000 mL | INTRAVENOUS | Status: AC
  Administered 2023-05-08: 250 mL via INTRAVENOUS
  Filled 2023-05-08: qty 250

## 2023-05-08 MED ORDER — LACTATED RINGERS IV BOLUS
1000.0000 mL | Freq: Once | INTRAVENOUS | Status: AC
  Administered 2023-05-08: 1000 mL via INTRAVENOUS

## 2023-05-08 NOTE — Progress Notes (Signed)
PHARMACY - TOTAL PARENTERAL NUTRITION CONSULT NOTE   Indication: Prolonged ileus  Patient Measurements: Height: 5\' 8"  (172.7 cm) Weight: 93.8 kg (206 lb 12.7 oz) IBW/kg (Calculated) : 68.4 TPN AdjBW (KG): 74.5 Body mass index is 31.44 kg/m. Usual Weight: 93 kg- patient reports no weight changes in the past 3 months  Assessment: 61 YOM admitted for a planned lap VHR on 04/29/2023. Pt with minimal oral intake 2/2 prolonged ileus. He is positive for flatus but has slowing JP drain OP. He has had a couple bouts of green emesis and no BM in 5 days. Pharmacy was consulted for TPN initiation and management.   Glucose / Insulin: History of T2DM; Hgb A1C 5.6 on 04/26/2023. CBG's <180; 0 units insulin given Electrolytes: K 3.8 (would like >/= 4 with ileus), Mag 1.8 (would like >/= 2 with ileus), Phos 2.2, other lytes wnl Renal: BUN 39, Scr 1.56 Hepatic: Albumin 2.6, LFTs wnl Intake / Output; MIVF: UOP ~0.5 ml/kg/hr, NG 500 mL, LBM 12/21 GI Imaging: 12/25 CT Abd- probable small bowel obstruction; small pockets of air in anterior adb may be within collapsed loop of small bowel 12/26 DG ABD- persistent small bowel dilatation noted likely related to postop ileus 12/27 DG Abd - Bowel-gas pattern not significantly improved since the CT on 05/04/2023. Favor ileus over mechanical bowel obstruction. 12/28 CT Abd- enlarged R. Hepatic lesion, persistent but decreased small bowel distention GI Surgeries / Procedures:  12/20 Lap VHR w/ mesh  Central access: 12/26 TPN start date: 12/26  Nutritional Goals: Goal TPN rate is 62 mL/hr  + SMOF lipid at 20.39ml/hr x 12 hours provides 120 g of protein and kcals per day)  Due to fluid shortages caused by [facility], Clinimix will be utilized to meet his nutritional needs as able.  RD Assessment: Estimated Needs Total Energy Estimated Needs: 2000-2200 kcal/d Total Protein Estimated Needs: 105-120g/d Total Fluid Estimated Needs: 2-2.2 L/d  Current  Nutrition:  NPO TPN Clinimix 8/10 at 62 ml/hr + SMOF lipids (20.16ml/hr x 12 hrs) provides 119 gm protein and 1482 kcal   Plan:  Continue Clinimix 8/10 w/o E-lytes at 62 mL/hr with SMOF lipid 20.8 ml/hr x12 hours daily. This will provide 119 grams of protein and 1482 Kcal (meeting 100% of estimated protein needs and 74% of kcal needs). Kphos 30 mmol IV Add standard MVI and trace elements to TPN Thiamine 100mg  IV x 5 days  Continue Sensitive q4h SSI and adjust as needed Monitor TPN labs on Mon/Thurs. Daily CMP, Mag, Phos x3 days (ending 12/29)   [name] BS, PharmD, BCPS Clinical Pharmacist 05/08/2023 7:11 AM  Contact: [phone] after 3 PM  "Be curious, not judgmental..." -Debbora Dus

## 2023-05-08 NOTE — Progress Notes (Signed)
Trauma/Critical Care Follow Up Note  Subjective:    Overnight Issues:   Objective:  Vital signs for last 24 hours: Temp:  [98.2 F (36.8 C)-99.3 F (37.4 C)] 98.2 F (36.8 C) (12/29 0740) Pulse Rate:  [81-86] 81 (12/29 0740) Resp:  [16-18] 18 (12/29 0740) BP: (144-168)/(61-69) 154/61 (12/29 0740) SpO2:  [90 %-100 %] 93 % (12/29 0740) Weight:  [93.8 kg] 93.8 kg (12/29 0500)  Hemodynamic parameters for last 24 hours:    Intake/Output from previous day: 12/28 0701 - 12/29 0700 In: 3224.2 [P.O.:455; I.V.:1480.8; IV Piggyback:1288.5] Out: 1600 [Urine:1100]  Intake/Output this shift: No intake/output data recorded.  Vent settings for last 24 hours:    Physical Exam:  Gen: comfortable, no distress Neuro: follows commands, alert, communicative HEENT: PERRL Neck: supple CV: RRR Pulm: unlabored breathing on RA Abd: soft, appropriately TTP GU: urine clear and yellow, +spontaneous voids Extr: wwp, no edema  Results for orders placed or performed during the hospital encounter of 04/29/23 (from the past 24 hours)  Glucose, capillary     Status: Abnormal   Collection Time: 05/07/23 11:59 AM  Result Value Ref Range   Glucose-Capillary 149 (H) 70 - 99 mg/dL  Glucose, capillary     Status: Abnormal   Collection Time: 05/07/23  5:05 PM  Result Value Ref Range   Glucose-Capillary 110 (H) 70 - 99 mg/dL  Glucose, capillary     Status: Abnormal   Collection Time: 05/07/23  7:52 PM  Result Value Ref Range   Glucose-Capillary 112 (H) 70 - 99 mg/dL  Glucose, capillary     Status: Abnormal   Collection Time: 05/07/23 11:05 PM  Result Value Ref Range   Glucose-Capillary 132 (H) 70 - 99 mg/dL  Comprehensive metabolic panel     Status: Abnormal   Collection Time: 05/08/23  2:35 AM  Result Value Ref Range   Sodium 141 135 - 145 mmol/L   Potassium 3.8 3.5 - 5.1 mmol/L   Chloride 109 98 - 111 mmol/L   CO2 23 22 - 32 mmol/L   Glucose, Bld 111 (H) 70 - 99 mg/dL   BUN 39 (H) 8 - 23  mg/dL   Creatinine, Ser 3.24 (H) 0.61 - 1.24 mg/dL   Calcium 8.4 (L) 8.9 - 10.3 mg/dL   Total Protein 6.3 (L) 6.5 - 8.1 g/dL   Albumin 2.6 (L) 3.5 - 5.0 g/dL   AST 15 15 - 41 U/L   ALT 19 0 - 44 U/L   Alkaline Phosphatase 44 38 - 126 U/L   Total Bilirubin 0.8 <1.2 mg/dL   GFR, Estimated 49 (L) >60 mL/min   Anion gap 9 5 - 15  Magnesium     Status: None   Collection Time: 05/08/23  2:35 AM  Result Value Ref Range   Magnesium 1.8 1.7 - 2.4 mg/dL  Phosphorus     Status: Abnormal   Collection Time: 05/08/23  2:35 AM  Result Value Ref Range   Phosphorus 2.2 (L) 2.5 - 4.6 mg/dL  CBC     Status: Abnormal   Collection Time: 05/08/23  2:35 AM  Result Value Ref Range   WBC 14.0 (H) 4.0 - 10.5 K/uL   RBC 3.44 (L) 4.22 - 5.81 MIL/uL   Hemoglobin 9.3 (L) 13.0 - 17.0 g/dL   HCT 40.1 (L) 02.7 - 25.3 %   MCV 78.5 (L) 80.0 - 100.0 fL   MCH 27.0 26.0 - 34.0 pg   MCHC 34.4 30.0 - 36.0 g/dL  RDW 14.6 11.5 - 15.5 %   Platelets 282 150 - 400 K/uL   nRBC 0.0 0.0 - 0.2 %  Glucose, capillary     Status: Abnormal   Collection Time: 05/08/23  4:06 AM  Result Value Ref Range   Glucose-Capillary 111 (H) 70 - 99 mg/dL  Glucose, capillary     Status: Abnormal   Collection Time: 05/08/23  7:41 AM  Result Value Ref Range   Glucose-Capillary 112 (H) 70 - 99 mg/dL    Assessment & Plan:  Present on Admission:  Incisional hernia    LOS: 9 days   Additional comments:I reviewed the patient's new clinical lab test results.   and I reviewed the patients new imaging test results.    VHR with biologic mesh -+flatus and BM yest, incision cdi, JP drain out. WBC had normalized, up to 14 today-AF, continue to monitor, continue mobilization and pain control. Path negative.  Ileus vs SBO - NGT accidentally removed yest, CTAP with persistent but slightly improved distention, with ROBF, will hold off on replacing, but cont NPO except sips AKI - resolving, hydrate FEN -NPO except sips with meds, PICC/TPN DVT -  SCDs, SQH Dispo - med-surg    Diamantina Monks, MD Trauma & General Surgery Please use [email or web address] to contact on call provider  05/08/2023  *Care during the described time interval was provided by me. I have reviewed this patient's available data, including medical history, events of note, physical examination and test results as part of my evaluation.

## 2023-05-09 DIAGNOSIS — C189 Malignant neoplasm of colon, unspecified: Secondary | ICD-10-CM | POA: Diagnosis not present

## 2023-05-09 LAB — GLUCOSE, CAPILLARY
Glucose-Capillary: 108 mg/dL — ABNORMAL HIGH (ref 70–99)
Glucose-Capillary: 112 mg/dL — ABNORMAL HIGH (ref 70–99)
Glucose-Capillary: 114 mg/dL — ABNORMAL HIGH (ref 70–99)
Glucose-Capillary: 115 mg/dL — ABNORMAL HIGH (ref 70–99)
Glucose-Capillary: 116 mg/dL — ABNORMAL HIGH (ref 70–99)
Glucose-Capillary: 121 mg/dL — ABNORMAL HIGH (ref 70–99)

## 2023-05-09 LAB — COMPREHENSIVE METABOLIC PANEL
ALT: 16 U/L (ref 0–44)
AST: 14 U/L — ABNORMAL LOW (ref 15–41)
Albumin: 2.4 g/dL — ABNORMAL LOW (ref 3.5–5.0)
Alkaline Phosphatase: 44 U/L (ref 38–126)
Anion gap: 9 (ref 5–15)
BUN: 35 mg/dL — ABNORMAL HIGH (ref 8–23)
CO2: 20 mmol/L — ABNORMAL LOW (ref 22–32)
Calcium: 8.2 mg/dL — ABNORMAL LOW (ref 8.9–10.3)
Chloride: 108 mmol/L (ref 98–111)
Creatinine, Ser: 1.5 mg/dL — ABNORMAL HIGH (ref 0.61–1.24)
GFR, Estimated: 51 mL/min — ABNORMAL LOW (ref >60)
Glucose, Bld: 123 mg/dL — ABNORMAL HIGH (ref 70–99)
Potassium: 3.8 mmol/L (ref 3.5–5.1)
Sodium: 137 mmol/L (ref 135–145)
Total Bilirubin: 0.7 mg/dL (ref <1.2)
Total Protein: 6 g/dL — ABNORMAL LOW (ref 6.5–8.1)

## 2023-05-09 LAB — URINALYSIS, ROUTINE W REFLEX MICROSCOPIC
Bilirubin Urine: NEGATIVE
Glucose, UA: NEGATIVE mg/dL
Hgb urine dipstick: NEGATIVE
Ketones, ur: NEGATIVE mg/dL
Leukocytes,Ua: NEGATIVE
Nitrite: NEGATIVE
Protein, ur: 30 mg/dL — AB
Specific Gravity, Urine: 1.019 (ref 1.005–1.030)
pH: 5 (ref 5.0–8.0)

## 2023-05-09 LAB — CBC
HCT: 27 % — ABNORMAL LOW (ref 39.0–52.0)
Hemoglobin: 9.4 g/dL — ABNORMAL LOW (ref 13.0–17.0)
MCH: 27 pg (ref 26.0–34.0)
MCHC: 34.8 g/dL (ref 30.0–36.0)
MCV: 77.6 fL — ABNORMAL LOW (ref 80.0–100.0)
Platelets: 276 10*3/uL (ref 150–400)
RBC: 3.48 MIL/uL — ABNORMAL LOW (ref 4.22–5.81)
RDW: 14.6 % (ref 11.5–15.5)
WBC: 14.7 10*3/uL — ABNORMAL HIGH (ref 4.0–10.5)
nRBC: 0 % (ref 0.0–0.2)

## 2023-05-09 LAB — PHOSPHORUS: Phosphorus: 2.5 mg/dL (ref 2.5–4.6)

## 2023-05-09 LAB — TRIGLYCERIDES: Triglycerides: 92 mg/dL (ref <150)

## 2023-05-09 LAB — MAGNESIUM: Magnesium: 1.5 mg/dL — ABNORMAL LOW (ref 1.7–2.4)

## 2023-05-09 MED ORDER — POTASSIUM PHOSPHATES 15 MMOLE/5ML IV SOLN
15.0000 mmol | Freq: Once | INTRAVENOUS | Status: AC
Start: 2023-05-09 — End: 2023-05-09
  Administered 2023-05-09: 15 mmol via INTRAVENOUS
  Filled 2023-05-09: qty 5

## 2023-05-09 MED ORDER — BOOST / RESOURCE BREEZE PO LIQD CUSTOM
1.0000 | Freq: Three times a day (TID) | ORAL | Status: DC
  Administered 2023-05-09 – 2023-05-12 (×6): 1 via ORAL

## 2023-05-09 MED ORDER — MAGNESIUM SULFATE 50 % IJ SOLN
3.0000 g | Freq: Once | INTRAVENOUS | Status: AC
  Administered 2023-05-09: 3 g via INTRAVENOUS
  Filled 2023-05-09: qty 6

## 2023-05-09 MED ORDER — LACTATED RINGERS IV BOLUS
1000.0000 mL | Freq: Once | INTRAVENOUS | Status: AC
Start: 2023-05-09 — End: 2023-05-09
  Administered 2023-05-09: 1000 mL via INTRAVENOUS

## 2023-05-09 MED ORDER — INFUVITE ADULT IV SOLN
INTRAVENOUS | Status: AC
  Filled 2023-05-09: qty 1488

## 2023-05-09 MED ORDER — INSULIN ASPART 100 UNIT/ML IJ SOLN
0.0000 [IU] | Freq: Three times a day (TID) | INTRAMUSCULAR | Status: DC
Start: 2023-05-09 — End: 2023-05-10

## 2023-05-09 MED ORDER — FAT EMUL FISH OIL/PLANT BASED 20% (SMOFLIPID)IV EMUL
250.0000 mL | INTRAVENOUS | Status: AC
  Administered 2023-05-09: 250 mL via INTRAVENOUS
  Filled 2023-05-09: qty 250

## 2023-05-09 NOTE — Progress Notes (Signed)
Nutrition Follow-up  DOCUMENTATION CODES:   Not applicable  INTERVENTION:   - TPN for nutrition, management per pharmacy team Energy recommendations can be found below Thiamine 100mg  x 5 days for moderate risk of refeeding - Boost Breeze po TID, each supplement provides 250 kcal and 9 grams of protein breeze - IF advanced to full liquids or above, Ensure Enlive po BID, each supplement provides 350 kcal and 20 grams of protein instead of Boost breeze   NUTRITION DIAGNOSIS:   Inadequate oral intake related to vomiting as evidenced by NPO status.  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  12/20 - Op, open incisional hernia repair with biologic mesh, adhesiolysis, small bowel serosal repair x2  12/25 - Abdominal CT scan suspicious for small bowel obstruction 12/26 - TPN started, SBO/ileus  12/28 -  NGT accidentally removed holding off on replacing 12/29 - clear liquids started   Pt endorses good appetite and PO intake PTA. He reports eating smaller meals/snacks throughout the day including protein bars, breakfast sandwiches, peanut butter crackers, trail mix, spaghetti, etc. He states he usually drinks 5-6 Ensures a week. Pt weight is stable.   Pt states since being in the hospital he has not had much of an appetite due to nausea but he feels it is coming back. He reports feeling very weak. Provided assurance to pt that his TPN is providing 74% of his energy and 100% of his calorie needs.  Provided pt with a Boost Breeze and he was excited to continue them.  No noted N/V. Per MD/oncology, identified enlarging hepatic lesion - suspect metastatic disease   TPN: TPN Clinimix 8/10 at 62 ml/hr + SMOF lipids (20.75ml/hr x 12 hrs) provides 119 gm protein (100%) and 1482 kcal (74% of needs) Of note patient requiring frequent electrolyte replacement given utilizing Clinimix w/o E.   Admit weight: 93 kg  Current weight: 95.3 kg    Diet Order Hx: 12/20 - clears 12/21 - NPO for surgery then advanced to soft 12/26 - NPO with NGT placement 12/29 - Clears   Average Meal Intake: 12/29: 75% intake x 1 recorded meals  Nutritionally Relevant Medications: Scheduled Meds:  docusate sodium  100 mg Oral BID   feeding supplement  1 Container Oral TID BM   gabapentin  100 mg Oral q morning   gabapentin  200 mg Oral QHS   heparin injection (subcutaneous)  5,000 Units Subcutaneous Q8H   insulin aspart  0-6 Units Subcutaneous Q8H   levETIRAcetam  500 mg Oral BID   methocarbamol  1,000 mg Oral QID   pantoprazole  40 mg Oral Daily   polyethylene glycol  17 g Oral BID   thiamine (VITAMIN B1) injection  100 mg Intravenous Q24H   Continuous Infusions:  TPN (CLINIMIX) Adult without lytes     And   fat emul(SMOFlipid)     potassium PHOSPHATE IVPB (in mmol) 15 mmol (05/09/23 1125)   TPN (CLINIMIX) Adult without lytes 62 mL/hr at 05/08/23  1725   Labs Reviewed: BUN 35, Creatinine 1.5, Calcium 8.2, Magnesium 1.5,  CBG ranges from 101-120 mg/dL over the last 24 hours  NUTRITION - FOCUSED PHYSICAL EXAM:  Flowsheet Row Most Recent Value  Orbital Region Mild depletion  Upper Arm Region Mild depletion  Thoracic and Lumbar Region No depletion  Temple Region Mild depletion  Clavicle Bone Region Mild depletion  Clavicle and Acromion Bone Region Mild depletion  Scapular Bone Region Mild depletion  Dorsal Hand No depletion  Patellar Region No depletion  Anterior Thigh Region No depletion   Posterior Calf Region Mild depletion  Edema (RD Assessment) None  Hair Reviewed  Eyes Reviewed  Mouth Reviewed  Skin Reviewed  Nails Reviewed       Diet Order:   Diet Order             Diet clear liquid Room service appropriate? Yes; Fluid consistency: Thin  Diet effective now                   EDUCATION NEEDS:   No education needs have been identified at this time  Skin:  Skin Assessment: Reviewed RN Assessment (surgical incisions to the midline)  Last BM:  05/09/23 - type 7  Height:   Ht Readings from Last 1 Encounters:  04/29/23 5\' 8"  (1.727 m)    Weight:   Wt Readings from Last 1 Encounters:  05/09/23 95.3 kg    Ideal Body Weight:  70 kg  BMI:  Body mass index is 31.95 kg/m.  Estimated Nutritional Needs:   Kcal:  2000-2200 kcal/d  Protein:  105-120g/d  Fluid:  2-2.2 L/d   Elliot Dally, RD Registered Dietitian  See Amion for more information

## 2023-05-09 NOTE — Progress Notes (Signed)
PHARMACY - TOTAL PARENTERAL NUTRITION CONSULT NOTE   Indication: Prolonged ileus  Patient Measurements: Height: 5\' 8"  (172.7 cm) Weight: 95.3 kg (210 lb 1.6 oz) IBW/kg (Calculated) : 68.4 TPN AdjBW (KG): 74.5 Body mass index is 31.95 kg/m. Usual Weight: 93 kg- patient reports no weight changes in the past 3 months  Assessment: 89 YOM admitted for a planned lap VHR on 04/29/2023. Pt with minimal oral intake 2/2 prolonged ileus. He is positive for flatus but has slowing JP drain OP. He has had a couple bouts of green emesis and no BM in 5 days. Pharmacy was consulted for TPN initiation and management.  Glucose / Insulin: History of T2DM; Hgb A1C 5.6 on 04/26/2023. CBG's 101-123; 0 units of sSSI insulin given Electrolytes: K 3.8 (goal >/= 4 with ileus, approx 40 mEq given 12/29 via KPhos), HCO3: 20, Mag: 1.5 (goal >/= 2 with ileus), Phos: 2.5 (30 mmol Kphos 12/29),  CoCa: 9.5, other lytes wnl Renal: BUN 30s, Scr 1.50 (baseline appears around 1.3-1.5)  Hepatic: Albumin 2.4, LFTs wnl, Alk phos: 44, albumin: 2.4, TG: 92  Intake / Output; MIVF: UOP 0.5 ml/kg/hr, NGT accidentally removed 12/29 holding off on replacing, LBM 12/29, s/p 1 L LR bolus on 12/29  GI Imaging: 12/25 CT Abd- probable small bowel obstruction; small pockets of air in anterior adb may be within collapsed loop of small bowel 05/04/2023. Favor ileus over mechanical bowel obstruction. 12/26 DG ABD- persistent small bowel dilatation noted likely related to postop ileus 12/27 DG Abd - Bowel-gas pattern not significantly improved since the CT on 12/28 CT Abd - persistent but decreased small bowel distention  GI Surgeries / Procedures:  12/20 Lap VHR w/ mesh  Central access: 12/26 TPN start date: 12/26  Nutritional Goals: Goal TPN rate is 62 mL/hr  + SMOF lipid at 20.85ml/hr x 12 hours provides 120 g of protein and kcals per day)  Due to fluid shortages caused by [facility], Clinimix will be utilized to meet his  nutritional needs as able.  RD Assessment: Estimated Needs Total Energy Estimated Needs: 2000-2200 kcal/d Total Protein Estimated Needs: 105-120g/d Total Fluid Estimated Needs: 2-2.2 L/d  Current Nutrition:  Clear liquids started 12/29  TPN Clinimix 8/10 at 62 ml/hr + SMOF lipids (20.45ml/hr x 12 hrs) provides 119 gm protein and 1482 kcal   Plan:  Continue Clinimix 8/10 w/o E-lytes at 62 mL/hr with SMOF lipid 20.8 ml/hr x12 hours daily. This will provide 119 grams of protein and 1482 Kcal (meeting 100% of estimated protein needs and 74% of kcal needs). Mag sulfate 3g per MD.  of Kphos.  Add standard MVI and trace elements to TPN Thiamine 100mg  IV x 5 days, ending 1/1  Reduce Sensitive Q8H SSI and adjust as needed, potential d/c in upcoming days.  Monitor TPN labs on Mon/Thurs. Daily CMP, Mag, Phos x3 days. Patient requiring frequent electrolyte replacement given utilizing Clinimix w/o E.  Toleration of clear liquids and ability to advance to full liquid diet.   Estill Batten, PharmD, BCCCP  Please see amion for complete clinical pharmacist phone list 05/09/2023 7:12 AM

## 2023-05-09 NOTE — Progress Notes (Signed)
IP PROGRESS NOTE  Subjective:   Thomas [name] is well-known to me with a history of colon cancer.  He was admitted electively on 04/29/2023 for repair of a ventral hernia.  He underwent an open incisional hernia repair, small bowel serosal repair, and lysis of adhesions.  He developed a postoperative ileus/small bowel obstruction and an NG tube was placed.  The NG tube was removed on 05/07/2023.  He is having bowel movements and denies nausea this morning. A CT abdomen/pelvis of 2824 revealed an enlarging right hepatic lesion compared to 07/21/2022.  There is persistent but decreased small bowel distention.  Postoperative changes at the anterior abdominal wall.  Thomas [name] reports feeling completely well prior to hospital admission.  Good appetite.  He was noted to have a 1.1 x 0.7 cm right hepatic lesion on CT 07/21/2022.  An MRI liver was ordered, but study was not completed.  Objective: Vital signs in last 24 hours: Blood pressure 128/65, pulse 80, temperature 97.8 F (36.6 C), temperature source Oral, resp. rate 17, height 5\' 8"  (1.727 m), weight 210 lb 1.6 oz (95.3 kg), SpO2 94%.  Intake/Output from previous day: 12/29 0701 - 12/30 0700 In: 2120.3 [P.O.:225; I.V.:1593.6; IV Piggyback:301.7] Out: 750 [Urine:750]  Physical Exam:  Lungs: Lear bilaterally Cardiac: Regular rate and rhythm Abdomen: Mildly distended with firm fullness to the right of the midline incision.  Midline incision with staples in place Extremities: No leg edema Lymph nodes: No cervical, supraclavicular, axillary, or inguinal nodes  Portacath/PICC-without erythema  Lab Results: Recent Labs    05/08/23 0235 05/09/23 0106  WBC 14.0* 14.7*  HGB 9.3* 9.4*  HCT 27.0* 27.0*  PLT 282 276    BMET Recent Labs    05/08/23 0235 05/09/23 0106  NA 141 137  K 3.8 3.8  CL 109 108  CO2 23 20*  GLUCOSE 111* 123*  BUN 39* 35*  CREATININE 1.56* 1.50*  CALCIUM 8.4* 8.2*    Lab Results  Component Value Date   CEA1  1.7 09/05/2020   CEA 1.92 09/10/2022    Studies/Results: CT ABDOMEN PELVIS W CONTRAST Result Date: 05/07/2023 CLINICAL DATA:  Intra-abdominal abscess.  History of mesh infection. EXAM: CT ABDOMEN AND PELVIS WITH CONTRAST TECHNIQUE: Multidetector CT imaging of the abdomen and pelvis was performed using the standard protocol following bolus administration of intravenous contrast. RADIATION DOSE REDUCTION: This exam was performed according to the departmental dose-optimization program which includes automated exposure control, adjustment of the mA and/or kV according to patient size and/or use of iterative reconstruction technique. CONTRAST:  60mL OMNIPAQUE IOHEXOL 350 MG/ML SOLN COMPARISON:  05/04/2023 FINDINGS: Lower chest: Patchy densities at lung bases are suggestive for areas of atelectasis and/or scarring. No large pleural effusions. Again noted is a tiny calcified granuloma in the left lower lobe on image 2/4. Hepatobiliary: 1.9 cm hypodensity in the anterior right hepatic lobe on image 12/3 and measured 1.1 cm on 07/21/2022. This is concerning for a metastatic lesion based on the history of colon cancer. No other definite hepatic lesions. Normal appearance of the gallbladder. Main portal venous system is patent. Pancreas: Unremarkable. No pancreatic ductal dilatation or surrounding inflammatory changes. Spleen: Normal in size without focal abnormality. Adrenals/Urinary Tract: Normal adrenal glands. Bilateral renal cysts that do not require dedicated follow-up. No suspicious renal lesion. No hydronephrosis. Mild distention of the urinary bladder. Stomach/Bowel: Nasogastric tube terminates in the proximal stomach. Again noted are dilated loops of small bowel in the mid abdomen. There is mesenteric edema in the  central mesentery and these findings are best seen on image 56/3. The degree of small bowel distension has slightly decreased since 05/04/2023. There is oral contrast in the distal small bowel.  Dilated colon in the anterior abdomen containing stool and gas. Vascular/Lymphatic: Aortic atherosclerosis. No enlarged abdominal or pelvic lymph nodes. Reproductive: No acute abnormality involving the prostate. Other: Bilateral inguinal hernias containing fat, left side is larger than right. Postsurgical changes along the anterior abdominal wall with a surgical drain in the anterior subcutaneous tissues. Again noted is high-density material surrounding the surgical drain that likely represents hematoma. This high-density material roughly measures 8.6 x 3.2 cm on image 49/3 and minimally changed from the recent comparison examination. There is no significant low-density fluid surrounding the drain or anterior subcutaneous tissues. No discrete fluid collection along the anterior abdominal wall. Musculoskeletal: No acute bone abnormality. IMPRESSION: 1. Enlarging right hepatic lesion. Based on the history of colon cancer, this raises concern for metastatic disease. Recommend further characterization with an abdominal MRI, with and without contrast. 2. Persistent but decreased small bowel distension. Persistent edema in the central mesentery around the dilated loops of small bowel. 3. Postoperative changes along the anterior abdominal wall. There is a surgical drain in the anterior subcutaneous tissues surrounded by hyperdense material. This hyperdense material likely represents hematoma and minimally changed from the exam on 05/04/2023. 4.  Aortic Atherosclerosis (ICD10-I70.0). 5. Atelectasis and/or scarring at the lung bases. Electronically Signed   By: Richarda Overlie M.D.   On: 05/07/2023 16:02    Medications: I have reviewed the patient's current medications.  Assessment/Plan: Transverse colon cancer, stage IIIb (T3N1a), status post a partial transverse colectomy and end colostomy 09/10/2020 CT abdomen/pelvis 09/04/2020- possible transverse colon mass with small regional lymph nodes Colonoscopy 09/05/2020-  obstructing transverse colon mass-biopsy invasive adenocarcinoma, mass could not be passed CT chest 09/09/2020-no evidence of metastatic disease, 3 x 2 mm subpleural nodule in the right upper lobe likely benign subpleural lymph node Partial transverse colectomy 09/10/2020, transverse colon tumor, no lymphovascular perineural invasion, 1/19 lymph nodes positive, negative margins, MSI stable, no loss of mismatch repair protein expression Cycle 1 CAPOX 10/08/2020, oxaliplatin dose reduced to 100 mg per metered squared secondary to renal insufficiency Cycle 2 CAPOX 10/29/2020 Cycle 3 CAPOX 11/19/2020, capecitabine dose reduced secondary to hand/foot syndrome, oxaliplatin dose reduced secondary to renal failure and poor performance status CT chest/abdomen/pelvis without contrast 12/01/2020-no acute abnormality in the chest, abdomen, or pelvis.  No evidence of obstruction. CTs 03/05/2022-no evidence of recurrent disease CT abdomen/pelvis 07/09/2022 and 07/21/2022-indeterminate segment 7 lesion measuring 1.2 cm Resection of a frontal meningioma 09/01/2016 Diabetes Rectal polyp- tubular adenoma on colonoscopy 09/05/2020 Hypertension Diabetic neuropathy Epilepsy Family history of breast and prostate cancer Admission with acute renal failure 10/01/2020-secondary to lack of oral intake and lisinopril, improved with intravenous hydration and holding lisinopril Admission 12/01/2020-sepsis, diarrhea HHS, A. fib with RVR, abdominal pain 11.  Respiratory failure requiring intubation 12/06/2020 Tracheostomy 12/18/2020 12.  Progressive renal failure-CRRT started 12/06/2020, discontinued after 12/16/2020, hemodialysis initiated 13.  Thrombocytopenia secondary to chemotherapy and sepsis, heparin-induced platelet antibody and SRA negative-resolved 14.  Right lower extremity DVT diagnosed 12/12/2020-on apixaban  15.  Anemia secondary to chronic disease, renal failure, and phlebotomy 16.  Admission 09/29/2021 with rotavirus 17.   Admission 04/29/2023 for an open ventral hernia repair 18.  Postoperative ileus/small bowel obstruction December 2024 19.  Chronic renal failure/acute renal injury following the ventral hernia repair December 2024   Thomas [name] has a history of  stage III colon cancer.  He has been in clinical remission following completion of adjuvant chemotherapy in 2022.  He is admitted following ventral hernia repair.  There is an enlarging right hepatic lesion, suspicious for metastasis.  I reviewed the CT images.  I discussed the CT findings with Mr. Goytia.  He agrees to further evaluation.  He may be a candidate for ablation of the liver lesion if this represents an isolated metastasis.  He does not appear to be a candidate for hepatic resection.  Recommendations: MRI liver to evaluate the right liver lesion Follow-up CEA from today Continue postoperative care per Dr. Bedelia Person Outpatient follow-up will be scheduled at the Cancer center         LOS: 10 days   Thornton Papas, MD   05/09/2023, 6:42 AM

## 2023-05-09 NOTE — Progress Notes (Signed)
Mobility Specialist: Progress Note   05/09/23 1539  Mobility  Range of Motion/Exercises Right leg;Left leg;Active  Activity Response Tolerated well  Mobility Referral Yes  Mobility visit 1 Mobility  Mobility Specialist Start Time (ACUTE ONLY) 1442  Mobility Specialist Stop Time (ACUTE ONLY) 1454  Mobility Specialist Time Calculation (min) (ACUTE ONLY) 12 min    Pt was agreeable to mobility session - received in bed. Feeling very weak and in pain, declined OOB mobility but agreeable to bed level exercises with encouragement. Completed lying marches, leg lifts, and ad/abduction exercises ind. Lef tin bed with all needs met, call bell in reach.   Maurene Capes Mobility Specialist Please contact via SecureChat or Rehab office at [phone]

## 2023-05-09 NOTE — Progress Notes (Signed)
Trauma/Critical Care Follow Up Note  Subjective:    Overnight Issues:   Objective:  Vital signs for last 24 hours: Temp:  [97.8 F (36.6 C)-98.4 F (36.9 C)] 98.4 F (36.9 C) (12/30 0852) Pulse Rate:  [80-87] 87 (12/30 0852) Resp:  [16-19] 16 (12/30 0852) BP: (128-150)/(65-67) 150/65 (12/30 0852) SpO2:  [94 %-98 %] 98 % (12/30 0852) Weight:  [95.3 kg] 95.3 kg (12/30 0431)  Hemodynamic parameters for last 24 hours:    Intake/Output from previous day: 12/29 0701 - 12/30 0700 In: 2345.3 [P.O.:450; I.V.:1593.6; IV Piggyback:301.7] Out: 1150 [Urine:1150]  Intake/Output this shift: No intake/output data recorded.  Vent settings for last 24 hours:    Physical Exam:  Gen: comfortable, no distress Neuro: follows commands, alert, communicative HEENT: PERRL Neck: supple CV: RRR Pulm: unlabored breathing on RA Abd: soft, appropriately TTP, incision clean, dry, intact ,    GU: urine clear and yellow, +spontaneous voids Extr: wwp, no edema  Results for orders placed or performed during the hospital encounter of 04/29/23 (from the past 24 hours)  Glucose, capillary     Status: Abnormal   Collection Time: 05/08/23 11:19 AM  Result Value Ref Range   Glucose-Capillary 101 (H) 70 - 99 mg/dL  Glucose, capillary     Status: Abnormal   Collection Time: 05/08/23  4:20 PM  Result Value Ref Range   Glucose-Capillary 110 (H) 70 - 99 mg/dL  Glucose, capillary     Status: Abnormal   Collection Time: 05/08/23  7:54 PM  Result Value Ref Range   Glucose-Capillary 120 (H) 70 - 99 mg/dL  Glucose, capillary     Status: Abnormal   Collection Time: 05/08/23 11:51 PM  Result Value Ref Range   Glucose-Capillary 110 (H) 70 - 99 mg/dL  Comprehensive metabolic panel     Status: Abnormal   Collection Time: 05/09/23  1:06 AM  Result Value Ref Range   Sodium 137 135 - 145 mmol/L   Potassium 3.8 3.5 - 5.1 mmol/L   Chloride 108 98 - 111 mmol/L   CO2 20 (L) 22 - 32 mmol/L   Glucose, Bld 123  (H) 70 - 99 mg/dL   BUN 35 (H) 8 - 23 mg/dL   Creatinine, Ser 0.98 (H) 0.61 - 1.24 mg/dL   Calcium 8.2 (L) 8.9 - 10.3 mg/dL   Total Protein 6.0 (L) 6.5 - 8.1 g/dL   Albumin 2.4 (L) 3.5 - 5.0 g/dL   AST 14 (L) 15 - 41 U/L   ALT 16 0 - 44 U/L   Alkaline Phosphatase 44 38 - 126 U/L   Total Bilirubin 0.7 <1.2 mg/dL   GFR, Estimated 51 (L) >60 mL/min   Anion gap 9 5 - 15  Magnesium     Status: Abnormal   Collection Time: 05/09/23  1:06 AM  Result Value Ref Range   Magnesium 1.5 (L) 1.7 - 2.4 mg/dL  Phosphorus     Status: None   Collection Time: 05/09/23  1:06 AM  Result Value Ref Range   Phosphorus 2.5 2.5 - 4.6 mg/dL  Triglycerides     Status: None   Collection Time: 05/09/23  1:06 AM  Result Value Ref Range   Triglycerides 92 <150 mg/dL  CBC     Status: Abnormal   Collection Time: 05/09/23  1:06 AM  Result Value Ref Range   WBC 14.7 (H) 4.0 - 10.5 K/uL   RBC 3.48 (L) 4.22 - 5.81 MIL/uL   Hemoglobin 9.4 (L) 13.0 -  17.0 g/dL   HCT 13.0 (L) 86.5 - 78.4 %   MCV 77.6 (L) 80.0 - 100.0 fL   MCH 27.0 26.0 - 34.0 pg   MCHC 34.8 30.0 - 36.0 g/dL   RDW 69.6 29.5 - 28.4 %   Platelets 276 150 - 400 K/uL   nRBC 0.0 0.0 - 0.2 %  Glucose, capillary     Status: Abnormal   Collection Time: 05/09/23  4:07 AM  Result Value Ref Range   Glucose-Capillary 115 (H) 70 - 99 mg/dL  Glucose, capillary     Status: Abnormal   Collection Time: 05/09/23  8:45 AM  Result Value Ref Range   Glucose-Capillary 114 (H) 70 - 99 mg/dL    Assessment & Plan:  Present on Admission:  Incisional hernia    LOS: 10 days   Additional comments:I reviewed the patient's new clinical lab test results.   and I reviewed the patients new imaging test results.    VHR with biologic mesh -+flatus and BM yest, incision cdi, JP drain out. WBC had normalized, up to 14 today-AF, continue to monitor, continue mobilization and pain control. Path negative.  Incidentally identified enlarging hepatic lesion - suspect metastatic  disease, notified Onc who has seen her this AM, CEA pending, recs for noncon CT chest, ordered Ileus vs SBO - NGT out with no n/v, started on CLD-felt like e overdid it yest PM, so will leave on clears AKI - resolving, hydrate FEN - CLD, PICC/TPN, replete hypomagnesemia DVT - SCDs, SQH Dispo - med-surg    Diamantina Monks, MD Trauma & General Surgery Please use [email or web address] to contact on call provider  05/09/2023  *Care during the described time interval was provided by me. I have reviewed this patient's available data, including medical history, events of note, physical examination and test results as part of my evaluation.

## 2023-05-09 NOTE — Progress Notes (Addendum)
PT Cancellation Note  Patient Details Name: Thomas [name] MRN: [id number] DOB: 1955-09-21   Cancelled Treatment:    Reason Eval/Treat Not Completed: (P) Pain limiting ability to participate (pt defers OOB until later in day, RN notified of pt pain by MS.) Will continue efforts per PT plan of care as schedule permits.   Dorathy Kinsman Nasia Cannan 05/09/2023, 12:42 PM

## 2023-05-10 ENCOUNTER — Inpatient Hospital Stay (HOSPITAL_COMMUNITY): Payer: Medicare PPO

## 2023-05-10 LAB — BASIC METABOLIC PANEL
Anion gap: 5 (ref 5–15)
BUN: 30 mg/dL — ABNORMAL HIGH (ref 8–23)
CO2: 21 mmol/L — ABNORMAL LOW (ref 22–32)
Calcium: 8.3 mg/dL — ABNORMAL LOW (ref 8.9–10.3)
Chloride: 107 mmol/L (ref 98–111)
Creatinine, Ser: 1.33 mg/dL — ABNORMAL HIGH (ref 0.61–1.24)
GFR, Estimated: 59 mL/min — ABNORMAL LOW (ref >60)
Glucose, Bld: 124 mg/dL — ABNORMAL HIGH (ref 70–99)
Potassium: 3.7 mmol/L (ref 3.5–5.1)
Sodium: 133 mmol/L — ABNORMAL LOW (ref 135–145)

## 2023-05-10 LAB — CBC
HCT: 28 % — ABNORMAL LOW (ref 39.0–52.0)
Hemoglobin: 9.8 g/dL — ABNORMAL LOW (ref 13.0–17.0)
MCH: 27 pg (ref 26.0–34.0)
MCHC: 35 g/dL (ref 30.0–36.0)
MCV: 77.1 fL — ABNORMAL LOW (ref 80.0–100.0)
Platelets: 314 10*3/uL (ref 150–400)
RBC: 3.63 MIL/uL — ABNORMAL LOW (ref 4.22–5.81)
RDW: 14.6 % (ref 11.5–15.5)
WBC: 16.1 10*3/uL — ABNORMAL HIGH (ref 4.0–10.5)
nRBC: 0 % (ref 0.0–0.2)

## 2023-05-10 LAB — GLUCOSE, CAPILLARY
Glucose-Capillary: 125 mg/dL — ABNORMAL HIGH (ref 70–99)
Glucose-Capillary: 141 mg/dL — ABNORMAL HIGH (ref 70–99)
Glucose-Capillary: 124 mg/dL — ABNORMAL HIGH (ref 70–99)
Glucose-Capillary: 110 mg/dL — ABNORMAL HIGH (ref 70–99)
Glucose-Capillary: 118 mg/dL — ABNORMAL HIGH (ref 70–99)

## 2023-05-10 LAB — PHOSPHORUS: Phosphorus: 2.8 mg/dL (ref 2.5–4.6)

## 2023-05-10 LAB — MAGNESIUM: Magnesium: 1.9 mg/dL (ref 1.7–2.4)

## 2023-05-10 LAB — CEA: CEA: 2 ng/mL (ref 0.0–4.7)

## 2023-05-10 MED ORDER — SODIUM CHLORIDE 0.9 % IV BOLUS
1000.0000 mL | Freq: Once | INTRAVENOUS | Status: AC
  Administered 2023-05-10: 1000 mL via INTRAVENOUS

## 2023-05-10 MED ORDER — GADOBUTROL 1 MMOL/ML IV SOLN
10.0000 mL | Freq: Once | INTRAVENOUS | Status: AC | PRN
  Administered 2023-05-10: 10 mL via INTRAVENOUS

## 2023-05-10 MED ORDER — POTASSIUM CHLORIDE CRYS ER 20 MEQ PO TBCR
40.0000 meq | EXTENDED_RELEASE_TABLET | Freq: Once | ORAL | Status: AC
  Administered 2023-05-10: 40 meq via ORAL
  Filled 2023-05-10: qty 2

## 2023-05-10 MED ORDER — TRACE MINERALS CU-MN-SE-ZN 300-55-60-3000 MCG/ML IV SOLN
INTRAVENOUS | Status: AC
  Filled 2023-05-10 (×2): qty 2000

## 2023-05-10 MED ORDER — INSULIN ASPART 100 UNIT/ML IJ SOLN: 0.0000 [IU] | Freq: Three times a day (TID) | INTRAMUSCULAR | Status: DC

## 2023-05-10 MED ORDER — MAGNESIUM SULFATE 2 GM/50ML IV SOLN
2.0000 g | Freq: Once | INTRAVENOUS | Status: AC
  Administered 2023-05-10: 2 g via INTRAVENOUS
  Filled 2023-05-10: qty 50

## 2023-05-10 MED ORDER — FAT EMUL FISH OIL/PLANT BASED 20% (SMOFLIPID)IV EMUL
250.0000 mL | INTRAVENOUS | Status: AC
  Administered 2023-05-10: 250 mL via INTRAVENOUS
  Filled 2023-05-10: qty 250

## 2023-05-10 NOTE — Plan of Care (Signed)

## 2023-05-10 NOTE — Plan of Care (Signed)
 IR was requested for liver lesion bx.   67 yo hx of colon CA s/p sx in Feb 2024, admitted for incisional  hernia repair on 12/20 , CT A/P with obtained on 12/28 due to pain which showed enlarging right hepatic lesion. MR liver yesterday states concern for metastatic colon CA. CT chest w/o yesterday showed no abnomal LN.  Case reviewed and approved for US  guided liver lesion bx by Dr. Katha.   The procedure is tentatively scheduled fro Thursday pending IR schedule.   PLAN - NPO except meds on Thursday MN - INR in AM - Thursday AM sq heparin  MAR held.  - Formal consult to follow on Thursday   Please call IR for questions and concerns.    Thomas [name] H Bevelyn Arriola PA-C 05/10/2023 4:07 PM

## 2023-05-10 NOTE — Progress Notes (Addendum)
 PHARMACY - TOTAL PARENTERAL NUTRITION CONSULT NOTE   Indication: Prolonged ileus  Patient Measurements: Height: 5' 8 (172.7 cm) Weight: 95.3 kg (210 lb 1.6 oz) IBW/kg (Calculated) : 68.4 TPN AdjBW (KG): 74.5 Body mass index is 31.95 kg/m. Usual Weight: 93 kg- patient reports no weight changes in the past 3 months  Assessment: 89 YOM admitted for a planned lap VHR on 04/29/2023. Pt with minimal oral intake 2/2 prolonged ileus. He is positive for flatus but has slowing JP drain OP. He has had a couple bouts of green emesis and no BM in 5 days. Pharmacy was consulted for TPN initiation and management.  Glucose / Insulin : History of T2DM; Hgb A1C 5.6 on 04/26/2023. CBG's 108-125; 0 units of sSSI insulin  given Electrolytes: Na: 133, K 3.7 (s/p approx via Kphos, goal >/= 4 with ileus, , HCO3: 21, Mag: 1.9 (s/p 3g mag sulfate, goal >/= 2 with ileus), Phos: 2.8 (s/p Kphos),  CoCa: 9.6, other lytes wnl Renal: BUN 30s, Scr 1.33 (baseline appears around 1.3-1.5)  Hepatic: 12/29- Albumin  2.4, LFTs wnl, Alk phos: 44, albumin : 2.4, TG: 92  Intake / Output; MIVF: UOP 0.5 ml/kg/hr, NGT accidentally removed 12/29 holding off on replacing, LBM 12/30, s/p 1 L LR bolus on 12/30  GI Imaging: 12/25 CT Abd- probable small bowel obstruction; small pockets of air in anterior adb may be within collapsed loop of small bowel 05/04/2023. Favor ileus over mechanical bowel obstruction. 12/26 DG ABD- persistent small bowel dilatation noted likely related to postop ileus 12/27 DG Abd - Bowel-gas pattern not significantly improved since the CT on 12/28 CT Abd - persistent but decreased small bowel distention  GI Surgeries / Procedures:  12/20 Lap VHR w/ mesh  Central access: 12/26 TPN start date: 12/26  Nutritional Goals: Goal TPN rate is 70 mL/hr  + SMOF lipid at 20.8ml/hr x 12 hours provides 134 g of protein and 1608 kcals per day  Due to fluid shortages caused by [facility], Clinimix  will be  utilized to meet his nutritional needs as able.  RD Assessment: Estimated Needs Total Energy Estimated Needs: 2000-2200 kcal/d Total Protein Estimated Needs: 105-120g/d Total Fluid Estimated Needs: 2-2.2 L/d  Current Nutrition:  Clear liquids started 12/29, advanced to FLD on 12/31  TPN Clinimix  8/10 at 70 ml/hr + SMOF lipids (20.22ml/hr x 12 hrs) provides 134 gm protein and 1600 kcal  Boost BID (x1 on 12/30)   Plan:  Clinimix  8/10 w/o E-lytes at 70 mL/hr with SMOF lipid 20.8 ml/hr x12 hours daily. This will provide 134 grams of protein and 1600 Kcal (meeting 111% of estimated protein needs and 80% of kcal needs). Mag sulfate 2g.  40mEq PO KCL.   Add standard MVI and trace elements to TPN Thiamine  100mg  IV x 5 days, ending 1/1  Continue Sensitive Q8H SSI and adjust as needed, potential d/c in upcoming days.  Monitor TPN labs on Mon/Thurs. Daily CMP, Mag, Phos x3 days. Patient requiring frequent electrolyte replacement given utilizing Clinimix  w/o E.  Toleration of full liquid diet and ability to wean TPN on 1/31.   Powell Blush, PharmD, BCCCP  Please see amion for complete clinical pharmacist phone list 05/10/2023 7:22 AM

## 2023-05-10 NOTE — Progress Notes (Signed)
 Expectorated sputum culture ordered by MD Lovick.   Specimen cup given to patient and patient instructed on required sample for sputum culture.  Patient advised RN that at this time he has not had any sputum and his cough is dry.  Patient verbalized understanding of what is an acceptable sputum sample, must be sputum not spit, in his own words to RN.

## 2023-05-10 NOTE — Progress Notes (Addendum)
 General Surgery Follow Up Note  Subjective:    Overnight Issues:   Objective:  Vital signs for last 24 hours: Temp:  [97.8 F (36.6 C)-98.7 F (37.1 C)] 97.8 F (36.6 C) (12/31 0828) Pulse Rate:  [71-79] 79 (12/31 0828) Resp:  [16-18] 16 (12/31 0350) BP: (143-173)/(65-71) 173/71 (12/31 0828) SpO2:  [95 %-100 %] 100 % (12/31 0828)  Hemodynamic parameters for last 24 hours:    Intake/Output from previous day: 12/30 0701 - 12/31 0700 In: 1287.8 [P.O.:220; I.V.:1067.8] Out: 1100 [Urine:1100]  Intake/Output this shift: Total I/O In: 717.7 [P.O.:480; I.V.:187.7; IV Piggyback:50] Out: -   Vent settings for last 24 hours:    Physical Exam:  Gen: comfortable, no distress Neuro: follows commands, alert, communicative HEENT: PERRL Neck: supple CV: RRR Pulm: unlabored breathing on RA Abd: soft, NT, incision clean, dry, intact  GU: urine clear and yellow, +spontaneous void Extr: wwp, no edema  Results for orders placed or performed during the hospital encounter of 04/29/23 (from the past 24 hours)  Glucose, capillary     Status: Abnormal   Collection Time: 05/09/23 12:27 PM  Result Value Ref Range   Glucose-Capillary 112 (H) 70 - 99 mg/dL  Glucose, capillary     Status: Abnormal   Collection Time: 05/09/23  5:27 PM  Result Value Ref Range   Glucose-Capillary 108 (H) 70 - 99 mg/dL  Glucose, capillary     Status: Abnormal   Collection Time: 05/09/23  8:25 PM  Result Value Ref Range   Glucose-Capillary 121 (H) 70 - 99 mg/dL  Glucose, capillary     Status: Abnormal   Collection Time: 05/09/23 11:44 PM  Result Value Ref Range   Glucose-Capillary 116 (H) 70 - 99 mg/dL  CBC     Status: Abnormal   Collection Time: 05/10/23  3:21 AM  Result Value Ref Range   WBC 16.1 (H) 4.0 - 10.5 K/uL   RBC 3.63 (L) 4.22 - 5.81 MIL/uL   Hemoglobin 9.8 (L) 13.0 - 17.0 g/dL   HCT 71.9 (L) 60.9 - 47.9 %   MCV 77.1 (L) 80.0 - 100.0 fL   MCH 27.0 26.0 - 34.0 pg   MCHC 35.0 30.0 - 36.0  g/dL   RDW 85.3 88.4 - 84.4 %   Platelets 314 150 - 400 K/uL   nRBC 0.0 0.0 - 0.2 %  Basic metabolic panel     Status: Abnormal   Collection Time: 05/10/23  3:21 AM  Result Value Ref Range   Sodium 133 (L) 135 - 145 mmol/L   Potassium 3.7 3.5 - 5.1 mmol/L   Chloride 107 98 - 111 mmol/L   CO2 21 (L) 22 - 32 mmol/L   Glucose, Bld 124 (H) 70 - 99 mg/dL   BUN 30 (H) 8 - 23 mg/dL   Creatinine, Ser 8.66 (H) 0.61 - 1.24 mg/dL   Calcium  8.3 (L) 8.9 - 10.3 mg/dL   GFR, Estimated 59 (L) >60 mL/min   Anion gap 5 5 - 15  Phosphorus     Status: None   Collection Time: 05/10/23  3:21 AM  Result Value Ref Range   Phosphorus 2.8 2.5 - 4.6 mg/dL  Magnesium      Status: None   Collection Time: 05/10/23  3:21 AM  Result Value Ref Range   Magnesium  1.9 1.7 - 2.4 mg/dL  Glucose, capillary     Status: Abnormal   Collection Time: 05/10/23  3:49 AM  Result Value Ref Range   Glucose-Capillary 125 (  H) 70 - 99 mg/dL  Glucose, capillary     Status: Abnormal   Collection Time: 05/10/23  8:58 AM  Result Value Ref Range   Glucose-Capillary 141 (H) 70 - 99 mg/dL    Assessment & Plan: The plan of care was discussed with the bedside nurse for the day, who is in agreement with this plan and no additional concerns were raised.   Present on Admission:  Incisional hernia    LOS: 11 days   Additional comments:I reviewed the patient's new clinical lab test results.   and I reviewed the patients new imaging test results.    VHR with biologic mesh -+flatus and BM, incision cdi, JP drain out. Continue mobilization and pain control. Path negative.  Incidentally identified enlarging hepatic lesion - suspect metastatic disease, notified Onc who has seen her this AM, CEA 2, recs for noncon CT chest, pending. Onc recs fro MRI liver, ordered.  Ileus vs SBO - NGT out with no n/v, adv to FLD ID - WBC had normalized, up to 16 today, AF, urine with rare bacteria, no LE/nitr, CT chest P, send bcx today  AKI - resolving,  hydrate FEN - FLD, PICC/TPN DVT - SCDs, SQH Dispo - med-surg     Thomas GEANNIE Hanger, MD Trauma & General Surgery Please use [email or web address] to contact on call provider  05/10/2023  *Care during the described time interval was provided by me. I have reviewed this patient's available data, including medical history, events of note, physical examination and test results as part of my evaluation.

## 2023-05-10 NOTE — Progress Notes (Signed)
 Physical Therapy Treatment Patient Details Name: Thomas [name] MRN: [id number] DOB: 04-12-1956 Today's Date: 05/10/2023   History of Present Illness The pt is a 67 yo male presenting 12/20 for repair of incisional hernia from colostomy takedown 06/2022 which was complicated by prolonged postop ileus. Incidentally identified enlarging hepatic lesion during above listed procedure, concern for metastasis. 05/12/23 plan for US  guided liver lesion bx by Dr. Katha with IR. PMH includes: OSA not on CPAP, seizures, brain tumor s/p resection 2018, peripheral edema, non-insulin -dependent DM2, HTN, GERD, peripheral neuropathy, colon cancer (s/p partial transverse colectomy and end colostomy 09/10/2020 followed by chemotherapy).    PT Comments  Pt received in supine, agreeable to therapy session and with good participation and tolerance for transfer and gait training with use of cane. Pt performed short distance gait trial initially ~62ft to bathroom without AD and Supervision, and after toileting he was able to progress to hallway gait trial with use of cane due to pt c/o intermittent mild lightheadedness, but did not need seated rest breaks, and also negotiated 4 stairs with rails and Supervision. Pt continues to benefit from PT services to progress toward functional mobility goals and would also benefit from working with mobility specialists or nursing staff to aggressively mobilize OOB while in hospital setting, do not anticipate need for post-acute PT services at this time, will continue to assess.     If plan is discharge home, recommend the following: Help with stairs or ramp for entrance;Assist for transportation   Can travel by private vehicle        Equipment Recommendations  None recommended by PT (pt has needed DME)    Recommendations for Other Services       Precautions / Restrictions Precautions Precautions: Fall Precaution Comments: abdominal surgery, abdominal binder Restrictions Weight  Bearing Restrictions Per Provider Order: No     Mobility  Bed Mobility Overal bed mobility: Modified Independent             General bed mobility comments: pt performed log roll w/o assist or cues needed    Transfers Overall transfer level: Needs assistance Equipment used: None, Straight cane Transfers: Sit to/from Stand Sit to Stand: Supervision           General transfer comment: cues for use of cane PRN for comfort    Ambulation/Gait Ambulation/Gait assistance: Supervision Gait Distance (Feet): 450 Feet (17ft to bathroom, then PTA re-enter ~10 mins later for gait training) Assistive device: Straight cane (PTA pushing IV pole for him, pt using cane only) Gait Pattern/deviations: Step-through pattern, Decreased stride length       General Gait Details: cues for activity pacing and energy conservation, initial mild c/o dizziness but pt reports this does not increase and defers seated break   Stairs Stairs: Yes Stairs assistance: Supervision Stair Management: Two rails, Step to pattern, Forwards Number of Stairs: 4 General stair comments: cues for step sequencing if one leg is more painful than the other one, pt receptive; step-to pattern for comfort/safety, pt using rail and cane first 2 steps, then performed again with bil rails; no LOB   Wheelchair Mobility     Tilt Bed    Modified Rankin (Stroke Patients Only)       Balance Overall balance assessment: No apparent balance deficits (not formally assessed)  Cognition Arousal: Alert Behavior During Therapy: WFL for tasks assessed/performed, Flat affect Overall Cognitive Status: Within Functional Limits for tasks assessed                                          Exercises Other Exercises Other Exercises: discussed use of IS and flutter valve, encouraged him to perform hourly    General Comments General comments (skin  integrity, edema, etc.): pt performed his own hygiene after toileting prior to gait trial      Pertinent Vitals/Pain Pain Assessment Pain Assessment: 0-10 Pain Score: 4  Pain Location: abdomen>feet Pain Descriptors / Indicators: Tingling, Sore (BLE tingling) Pain Intervention(s): Monitored during session, Repositioned    Home Living                          Prior Function            PT Goals (current goals can now be found in the care plan section) Acute Rehab PT Goals Patient Stated Goal: return home PT Goal Formulation: With patient Time For Goal Achievement: 05/07/23 Progress towards PT goals: Progressing toward goals    Frequency    Min 1X/week      PT Plan      Co-evaluation              AM-PAC PT 6 Clicks Mobility   Outcome Measure  Help needed turning from your back to your side while in a flat bed without using bedrails?: None Help needed moving from lying on your back to sitting on the side of a flat bed without using bedrails?: None Help needed moving to and from a bed to a chair (including a wheelchair)?: A Little Help needed standing up from a chair using your arms (e.g., wheelchair or bedside chair)?: A Little Help needed to walk in hospital room?: A Little Help needed climbing 3-5 steps with a railing? : A Little 6 Click Score: 20    End of Session   Activity Tolerance: Patient tolerated treatment well Patient left: with call bell/phone within reach;in bed (pt has been using call bell prior to getting OOB per RN/NT report) Nurse Communication: Mobility status PT Visit Diagnosis: Other abnormalities of gait and mobility (R26.89);Muscle weakness (generalized) (M62.81);Pain Pain - part of body: Ankle and joints of foot (abdomen and neuropathic foot pain)     Time: 1617 (out 1621 while pt using bathroom)-1653 (back in [phone] for gait trial after pt done in bathroom =26 total mins) PT Time Calculation (min) (ACUTE ONLY): 36  min  Charges:    $Gait Training: 23-37 mins PT General Charges $$ ACUTE PT VISIT: 1 Visit                     Thomas [name] P., PTA Acute Rehabilitation Services Secure Chat Preferred 9a-5:30pm Office: [phone]    Thomas [name] 05/10/2023, 5:11 PM

## 2023-05-11 LAB — GLUCOSE, CAPILLARY
Glucose-Capillary: 112 mg/dL — ABNORMAL HIGH (ref 70–99)
Glucose-Capillary: 121 mg/dL — ABNORMAL HIGH (ref 70–99)
Glucose-Capillary: 107 mg/dL — ABNORMAL HIGH (ref 70–99)

## 2023-05-11 LAB — BASIC METABOLIC PANEL
Anion gap: 6 (ref 5–15)
BUN: 34 mg/dL — ABNORMAL HIGH (ref 8–23)
CO2: 18 mmol/L — ABNORMAL LOW (ref 22–32)
Calcium: 8.2 mg/dL — ABNORMAL LOW (ref 8.9–10.3)
Chloride: 109 mmol/L (ref 98–111)
Creatinine, Ser: 1.52 mg/dL — ABNORMAL HIGH (ref 0.61–1.24)
GFR, Estimated: 50 mL/min — ABNORMAL LOW (ref >60)
Glucose, Bld: 125 mg/dL — ABNORMAL HIGH (ref 70–99)
Potassium: 4 mmol/L (ref 3.5–5.1)
Sodium: 133 mmol/L — ABNORMAL LOW (ref 135–145)

## 2023-05-11 LAB — CBC
HCT: 27 % — ABNORMAL LOW (ref 39.0–52.0)
Hemoglobin: 9.3 g/dL — ABNORMAL LOW (ref 13.0–17.0)
MCH: 26.7 pg (ref 26.0–34.0)
MCHC: 34.4 g/dL (ref 30.0–36.0)
MCV: 77.6 fL — ABNORMAL LOW (ref 80.0–100.0)
Platelets: 301 10*3/uL (ref 150–400)
RBC: 3.48 MIL/uL — ABNORMAL LOW (ref 4.22–5.81)
RDW: 14.9 % (ref 11.5–15.5)
WBC: 16.4 10*3/uL — ABNORMAL HIGH (ref 4.0–10.5)
nRBC: 0 % (ref 0.0–0.2)

## 2023-05-11 LAB — MAGNESIUM: Magnesium: 2.1 mg/dL (ref 1.7–2.4)

## 2023-05-11 LAB — PHOSPHORUS: Phosphorus: 2.5 mg/dL (ref 2.5–4.6)

## 2023-05-11 MED ORDER — POTASSIUM PHOSPHATES 15 MMOLE/5ML IV SOLN
15.0000 mmol | Freq: Once | INTRAVENOUS | Status: AC
  Administered 2023-05-11: 15 mmol via INTRAVENOUS
  Filled 2023-05-11: qty 5

## 2023-05-11 MED ORDER — FAT EMUL FISH OIL/PLANT BASED 20% (SMOFLIPID)IV EMUL
250.0000 mL | INTRAVENOUS | Status: AC
  Administered 2023-05-11: 250 mL via INTRAVENOUS
  Filled 2023-05-11: qty 250

## 2023-05-11 MED ORDER — TRACE MINERALS CU-MN-SE-ZN 300-55-60-3000 MCG/ML IV SOLN
INTRAVENOUS | Status: AC
  Filled 2023-05-11 (×2): qty 2000

## 2023-05-11 MED ORDER — HYDROMORPHONE HCL 1 MG/ML IJ SOLN
0.5000 mg | Freq: Four times a day (QID) | INTRAMUSCULAR | Status: DC | PRN
  Administered 2023-05-11 – 2023-05-13 (×4): 0.5 mg via INTRAVENOUS
  Filled 2023-05-11 (×4): qty 0.5

## 2023-05-11 MED ORDER — SODIUM CHLORIDE 0.9 % IV BOLUS
1000.0000 mL | Freq: Once | INTRAVENOUS | Status: AC
  Administered 2023-05-11: 1000 mL via INTRAVENOUS

## 2023-05-11 NOTE — Progress Notes (Signed)
 Mobility Specialist: Progress Note   05/11/23 1422  Mobility  Activity Ambulated with assistance in hallway  Level of Assistance Standby assist, set-up cues, supervision of patient - no hands on  Assistive Device Other (Comment) (IV pole)  Distance Ambulated (ft) 400 ft  Activity Response Tolerated well  Mobility Referral Yes  Mobility visit 1 Mobility  Mobility Specialist Start Time (ACUTE ONLY) 1307  Mobility Specialist Stop Time (ACUTE ONLY) 1330  Mobility Specialist Time Calculation (min) (ACUTE ONLY) 23 min    Pt was agreeable to mobility session - received ambulating from BR. Performed chair exercises (seated marches, knee kicks, and ankle pumps) and then completed hallway ambulation. C/o pain and discomfort, also stated he felt very tried and weak. Sneaker helped with foot pain during ambulation compared to socks. Returned to room without fault. Left in bed with all needs met, call bell in reach. RN notified.   Ileana Lute Mobility Specialist Please contact via SecureChat or Rehab office at [phone]

## 2023-05-11 NOTE — Progress Notes (Signed)
 PHARMACY - TOTAL PARENTERAL NUTRITION CONSULT NOTE   Indication: Prolonged ileus  Patient Measurements: Height: 5' 8 (172.7 cm) Weight: 95.3 kg (210 lb 1.6 oz) IBW/kg (Calculated) : 68.4 TPN AdjBW (KG): 74.5 Body mass index is 31.95 kg/m. Usual Weight: 93 kg- patient reports no weight changes in the past 3 months  Assessment: 60 YOM admitted for a planned lap VHR on 04/29/2023. Pt with minimal oral intake 2/2 prolonged ileus. He is positive for flatus but has slowing JP drain OP. He has had a couple bouts of green emesis and no BM in 5 days. Pharmacy was consulted for TPN initiation and management.  Glucose / Insulin : History of T2DM; Hgb A1C 5.6 on 04/26/2023. CBG's 120's; 0 units of sSSI insulin  given Electrolytes: Na: 133, K 4.0 (goal >/= 4 with ileus), HCO3 18, Mag: 2.1 (goal >/= 2 with ileus), Phos: 2.5,  CoCa: 9.6, other lytes wnl Renal: BUN 30s, Scr 1.52 (baseline appears around 1.3-1.5)  Hepatic: 12/29- Albumin  2.4, LFTs wnl, Alk phos: 44, albumin : 2.4, TG: 92  Intake / Output; MIVF: UOP 0.26 ml/kg/hr, NGT accidentally removed 12/29 holding off on replacing, LBM 12/31, s/p 1 L NS bolus on 1/1  GI Imaging: 12/25 CT Abd- probable small bowel obstruction; small pockets of air in anterior adb may be within collapsed loop of small bowel 05/04/2023. Favor ileus over mechanical bowel obstruction. 12/26 DG ABD- persistent small bowel dilatation noted likely related to postop ileus 12/27 DG Abd - Bowel-gas pattern not significantly improved since the CT on 12/28 CT Abd - persistent but decreased small bowel distention  12/31 MR liver - Persistent dilated loops of small and large bowel with air-fluid levels and surgical changes along the anterior abdominal wall. Growth of liver lesion.  GI Surgeries / Procedures:  12/20 Lap VHR w/ mesh  Central access: 12/26 TPN start date: 12/26  Nutritional Goals: Goal TPN rate is 70 mL/hr  + SMOF lipid at 20.8ml/hr x 12 hours provides 134 g of  protein and 1608 kcals per day  Due to fluid shortages caused by [facility], Clinimix  will be utilized to meet his nutritional needs as able.  RD Assessment: Estimated Needs Total Energy Estimated Needs: 2000-2200 kcal/d Total Protein Estimated Needs: 105-120g/d Total Fluid Estimated Needs: 2-2.2 L/d  Current Nutrition:  Clear liquids started 12/29, advanced to FLD on 12/31 - limited intake  TPN Clinimix  8/10 at 70 ml/hr + SMOF lipids (20.20ml/hr x 12 hrs) provides 134 gm protein and 1600 kcal  Boost BID (x2 on 12/31)   Plan:  Clinimix  8/10 w/o E-lytes at 70 mL/hr with SMOF lipid 20.8 ml/hr x12 hours daily. This will provide 134 grams of protein and 1600 Kcal (meeting 111% of estimated protein needs and 80% of kcal needs). 15 mmol Kphos  Add standard MVI and trace elements to TPN Thiamine  100mg  IV x 5 days, ending 1/1  Continue Sensitive Q8H SSI and adjust as needed, potential d/c in upcoming days.  Monitor TPN labs on Mon/Thurs. Daily CMP, Mag, Phos x3 days. Patient requiring frequent electrolyte replacement given utilizing Clinimix  w/o E.  Follow-up toleration of full liquid diet and ability to wean TPN.   Rankin Sams, PharmD, BCPS, BCCCP Clinical Pharmacist

## 2023-05-11 NOTE — Progress Notes (Addendum)
 General Surgery Follow Up Note  Subjective:    Overnight Issues:   Objective:  Vital signs for last 24 hours: Temp:  [97.8 F (36.6 C)-98.5 F (36.9 C)] 98.5 F (36.9 C) (01/01 0739) Pulse Rate:  [69-79] 69 (01/01 0739) Resp:  [17-18] 18 (01/01 0739) BP: (125-173)/(54-71) 133/54 (01/01 0739) SpO2:  [98 %-100 %] 99 % (01/01 0739)  Hemodynamic parameters for last 24 hours:    Intake/Output from previous day: 12/31 0701 - 01/01 0700 In: 2357.7 [P.O.:600; I.V.:708; IV Piggyback:1049.7] Out: 750 [Urine:500; Emesis/NG output:250]  Intake/Output this shift: No intake/output data recorded.  Vent settings for last 24 hours:    Physical Exam:  Gen: comfortable, no distress Neuro: follows commands, alert, communicative HEENT: PERRL Neck: supple CV: RRR Pulm: unlabored breathing on RA Abd: soft, NT, incision clean, dry, intact  GU: urine clear and yellow, +spontaneous void Extr: wwp, no edema  Results for orders placed or performed during the hospital encounter of 04/29/23 (from the past 24 hours)  Glucose, capillary     Status: Abnormal   Collection Time: 05/10/23  8:58 AM  Result Value Ref Range   Glucose-Capillary 141 (H) 70 - 99 mg/dL  Culture, blood (Routine X 2) w Reflex to ID Panel     Status: None (Preliminary result)   Collection Time: 05/10/23 10:44 AM   Specimen: BLOOD  Result Value Ref Range   Specimen Description BLOOD BACTERIAL CASTS    Special Requests      BOTTLES DRAWN AEROBIC ONLY Blood Culture results may not be optimal due to an inadequate volume of blood received in culture bottles   Culture      NO GROWTH < 24 HOURS Performed at [facility], [address], [place], [place] [address]    Report Status PENDING   Culture, blood (Routine X 2) w Reflex to ID Panel     Status: None (Preliminary result)   Collection Time: 05/10/23 10:49 AM   Specimen: BLOOD  Result Value Ref Range   Specimen Description BLOOD BLOOD LEFT ARM    Special  Requests      BOTTLES DRAWN AEROBIC AND ANAEROBIC Blood Culture results may not be optimal due to an inadequate volume of blood received in culture bottles   Culture      NO GROWTH < 24 HOURS Performed at [facility], [address], [place], [place] [address]    Report Status PENDING   Glucose, capillary     Status: Abnormal   Collection Time: 05/10/23 11:39 AM  Result Value Ref Range   Glucose-Capillary 124 (H) 70 - 99 mg/dL  Glucose, capillary     Status: Abnormal   Collection Time: 05/10/23  4:52 PM  Result Value Ref Range   Glucose-Capillary 110 (H) 70 - 99 mg/dL  Glucose, capillary     Status: Abnormal   Collection Time: 05/10/23  7:57 PM  Result Value Ref Range   Glucose-Capillary 118 (H) 70 - 99 mg/dL  Phosphorus     Status: None   Collection Time: 05/11/23  4:17 AM  Result Value Ref Range   Phosphorus 2.5 2.5 - 4.6 mg/dL  Magnesium      Status: None   Collection Time: 05/11/23  4:17 AM  Result Value Ref Range   Magnesium  2.1 1.7 - 2.4 mg/dL  CBC     Status: Abnormal   Collection Time: 05/11/23  4:17 AM  Result Value Ref Range   WBC 16.4 (H) 4.0 - 10.5  K/uL   RBC 3.48 (L) 4.22 - 5.81 MIL/uL   Hemoglobin 9.3 (L) 13.0 - 17.0 g/dL   HCT 72.9 (L) 60.9 - 47.9 %   MCV 77.6 (L) 80.0 - 100.0 fL   MCH 26.7 26.0 - 34.0 pg   MCHC 34.4 30.0 - 36.0 g/dL   RDW 85.0 88.4 - 84.4 %   Platelets 301 150 - 400 K/uL   nRBC 0.0 0.0 - 0.2 %  Basic metabolic panel     Status: Abnormal   Collection Time: 05/11/23  4:17 AM  Result Value Ref Range   Sodium 133 (L) 135 - 145 mmol/L   Potassium 4.0 3.5 - 5.1 mmol/L   Chloride 109 98 - 111 mmol/L   CO2 18 (L) 22 - 32 mmol/L   Glucose, Bld 125 (H) 70 - 99 mg/dL   BUN 34 (H) 8 - 23 mg/dL   Creatinine, Ser 8.47 (H) 0.61 - 1.24 mg/dL   Calcium  8.2 (L) 8.9 - 10.3 mg/dL   GFR, Estimated 50 (L) >60 mL/min   Anion gap 6 5 - 15  Glucose, capillary     Status: Abnormal   Collection Time: 05/11/23  6:03 AM  Result Value Ref Range    Glucose-Capillary 112 (H) 70 - 99 mg/dL    Assessment & Plan:  Present on Admission:  Incisional hernia    LOS: 12 days   Additional comments:I reviewed the patient's new clinical lab test results.   and I reviewed the patients new imaging test results.    VHR with biologic mesh -+flatus and BM, incision cdi, JP drain out. Continue mobilization and pain control. Path negative. CT A/P 12/28 without any post-op changes. Remove staples today.  Incidentally identified enlarging hepatic lesion - suspect metastatic disease, CEA 2, noncon CT chest and MRI liver completed.  Plan  IR liver bx 1/2. Palliative consult also placed for GoC/MOST form. Ileus vs SBO - limited PO intake yest, nausea x1 yest, and persistent SB distention on MRI yest ID - WBC had normalized, stable at 16 today, AF, urine with rare bacteria, no LE/nitr, CT chest not particularly remarkable, bcx 12/31 NGTD  AKI - back to baseline creatinine, continue to hydrate FEN - FLD, PICC/TPN, continue full TPN DVT - SCDs, SQH Dispo - med-surg     Thomas GEANNIE Hanger, MD Trauma & General Surgery Please use [email or web address] to contact on call provider  05/11/2023  *Care during the described time interval was provided by me. I have reviewed this patient's available data, including medical history, events of note, physical examination and test results as part of my evaluation.

## 2023-05-11 NOTE — Plan of Care (Signed)

## 2023-05-11 NOTE — Progress Notes (Signed)
 10 staples were removed from the abdomen, and pt tolerated it well.

## 2023-05-12 ENCOUNTER — Encounter: Payer: Self-pay | Admitting: *Deleted

## 2023-05-12 ENCOUNTER — Encounter (HOSPITAL_COMMUNITY): Payer: Self-pay | Admitting: Surgery

## 2023-05-12 ENCOUNTER — Inpatient Hospital Stay (HOSPITAL_COMMUNITY): Payer: Medicare PPO

## 2023-05-12 DIAGNOSIS — K432 Incisional hernia without obstruction or gangrene: Principal | ICD-10-CM

## 2023-05-12 DIAGNOSIS — C184 Malignant neoplasm of transverse colon: Secondary | ICD-10-CM

## 2023-05-12 LAB — PROTIME-INR
INR: 1.1 (ref 0.8–1.2)
Prothrombin Time: 14.4 s (ref 11.4–15.2)

## 2023-05-12 LAB — COMPREHENSIVE METABOLIC PANEL
ALT: 29 U/L (ref 0–44)
AST: 22 U/L (ref 15–41)
Albumin: 2.5 g/dL — ABNORMAL LOW (ref 3.5–5.0)
Alkaline Phosphatase: 46 U/L (ref 38–126)
Anion gap: 7 (ref 5–15)
BUN: 36 mg/dL — ABNORMAL HIGH (ref 8–23)
CO2: 17 mmol/L — ABNORMAL LOW (ref 22–32)
Calcium: 8.3 mg/dL — ABNORMAL LOW (ref 8.9–10.3)
Chloride: 110 mmol/L (ref 98–111)
Creatinine, Ser: 1.52 mg/dL — ABNORMAL HIGH (ref 0.61–1.24)
GFR, Estimated: 50 mL/min — ABNORMAL LOW (ref >60)
Glucose, Bld: 112 mg/dL — ABNORMAL HIGH (ref 70–99)
Potassium: 4 mmol/L (ref 3.5–5.1)
Sodium: 134 mmol/L — ABNORMAL LOW (ref 135–145)
Total Bilirubin: 0.4 mg/dL (ref 0.0–1.2)
Total Protein: 5.8 g/dL — ABNORMAL LOW (ref 6.5–8.1)

## 2023-05-12 LAB — GLUCOSE, CAPILLARY
Glucose-Capillary: 109 mg/dL — ABNORMAL HIGH (ref 70–99)
Glucose-Capillary: 88 mg/dL (ref 70–99)
Glucose-Capillary: 112 mg/dL — ABNORMAL HIGH (ref 70–99)
Glucose-Capillary: 104 mg/dL — ABNORMAL HIGH (ref 70–99)

## 2023-05-12 LAB — CBC
HCT: 25.8 % — ABNORMAL LOW (ref 39.0–52.0)
Hemoglobin: 9.1 g/dL — ABNORMAL LOW (ref 13.0–17.0)
MCH: 27.2 pg (ref 26.0–34.0)
MCHC: 35.3 g/dL (ref 30.0–36.0)
MCV: 77 fL — ABNORMAL LOW (ref 80.0–100.0)
Platelets: 315 10*3/uL (ref 150–400)
RBC: 3.35 MIL/uL — ABNORMAL LOW (ref 4.22–5.81)
RDW: 15.1 % (ref 11.5–15.5)
WBC: 16.6 10*3/uL — ABNORMAL HIGH (ref 4.0–10.5)
nRBC: 0 % (ref 0.0–0.2)

## 2023-05-12 LAB — MAGNESIUM: Magnesium: 1.7 mg/dL (ref 1.7–2.4)

## 2023-05-12 LAB — PHOSPHORUS: Phosphorus: 2.8 mg/dL (ref 2.5–4.6)

## 2023-05-12 MED ORDER — MIDAZOLAM HCL 2 MG/2ML IJ SOLN
INTRAMUSCULAR | Status: AC
  Filled 2023-05-12: qty 2

## 2023-05-12 MED ORDER — LIDOCAINE HCL (PF) 1 % IJ SOLN
5.0000 mL | Freq: Once | INTRAMUSCULAR | Status: AC
  Administered 2023-05-12: 5 mL via INTRADERMAL

## 2023-05-12 MED ORDER — FAT EMUL FISH OIL/PLANT BASED 20% (SMOFLIPID)IV EMUL
250.0000 mL | INTRAVENOUS | Status: AC
  Administered 2023-05-12: 250 mL via INTRAVENOUS
  Filled 2023-05-12: qty 250

## 2023-05-12 MED ORDER — FENTANYL CITRATE (PF) 100 MCG/2ML IJ SOLN
INTRAMUSCULAR | Status: AC | PRN
  Administered 2023-05-12 (×2): 25 ug via INTRAVENOUS

## 2023-05-12 MED ORDER — MIDAZOLAM HCL 2 MG/2ML IJ SOLN
INTRAMUSCULAR | Status: AC | PRN
  Administered 2023-05-12: .5 mg via INTRAVENOUS
  Administered 2023-05-12: 1 mg via INTRAVENOUS

## 2023-05-12 MED ORDER — TRACE MINERALS CU-MN-SE-ZN 300-55-60-3000 MCG/ML IV SOLN
INTRAVENOUS | Status: AC
  Filled 2023-05-12: qty 2000

## 2023-05-12 MED ORDER — SODIUM CHLORIDE 0.9 % IV BOLUS
500.0000 mL | Freq: Once | INTRAVENOUS | Status: AC
  Administered 2023-05-12: 500 mL via INTRAVENOUS

## 2023-05-12 MED ORDER — FENTANYL CITRATE (PF) 100 MCG/2ML IJ SOLN
INTRAMUSCULAR | Status: AC
  Filled 2023-05-12: qty 2

## 2023-05-12 MED ORDER — MAGNESIUM SULFATE 2 GM/50ML IV SOLN
2.0000 g | Freq: Once | INTRAVENOUS | Status: AC
  Administered 2023-05-12: 2 g via INTRAVENOUS
  Filled 2023-05-12: qty 50

## 2023-05-12 NOTE — Progress Notes (Signed)
 Nutrition Follow-up  DOCUMENTATION CODES:   Not applicable  INTERVENTION:   -Continue current TPN, management per pharmacy.  -Diet progression per surgeon, once taking solid food recommend adding protein focused snacks TID.    NUTRITION DIAGNOSIS:   Inadequate oral intake related to vomiting as evidenced by NPO status.  -addressing with TPN  GOAL:   Patient will meet greater than or equal to 90% of their needs  -progressing  MONITOR:   I & O's, Diet advancement, Labs, Weight trends, Other (Comment) (TPN)  REASON FOR ASSESSMENT:   Follow up  ASSESSMENT:   Pt with hx of HTN, HLD, GERD, DM type 2, CKD, hx colon cancer s/p chemo, partial transverse colectomy and end colostomy 09/10/2020 (takedown 06/2022), presented to ED for planned incisional hernia repair surgery.  12/20 - Op, open incisional hernia repair with biologic mesh, adhesiolysis, small bowel serosal repair x2  12/25 - Abdominal CT scan suspicious for small bowel obstruction 12/26 - TPN started, SBO/ileus  12/28 -  NGT accidentally removed holding off on replacing 12/29 - clear liquids started   01/02- NPO, IR guided biopsy of liver lesion  Currently NPO.  He denies feeling hungry. Continues on TPN.  TPN Clinimix  8/10 at 70 ml/hr + SMOF lipids (20.15ml/hr x 12 hrs) provides 134 gm protein (111 %) and 1607 kcal (80 % of needs)    Patient notes he does not care for the Ensure, too sweet.  Current weight up 5# from admit as of 12/30, need updated weight.  Medications reviewed and include colace, novolog  SS 3x daily before meals, PPI.  Labs reviewed   Diet Order:   Diet Order             Diet NPO time specified Except for: Sips with Meds  Diet effective midnight                   EDUCATION NEEDS:   No education needs have been identified at this time  Skin:  Skin Assessment: Skin Integrity Issues: Skin Integrity Issues:: Incisions, Other (Comment) Incisions: abdominal and abdominal  port Other: pucture site R flank for liver bx  Last BM:  05/10/2023, type 6-small  Height:   Ht Readings from Last 1 Encounters:  04/29/23 5' 8 (1.727 m)    Weight:   Wt Readings from Last 1 Encounters:  05/09/23 95.3 kg    Ideal Body Weight:  70 kg  BMI:  Body mass index is 31.95 kg/m.  Estimated Nutritional Needs:   Kcal:  2000-2200 kcal/d  Protein:  105-120g/d  Fluid:  2-2.2 L/d    Elveria Sable, RDLD Clinical Dietitian If unable to reach, please contact RD Inpatient secure chat group between 8 am-4 pm daily

## 2023-05-12 NOTE — Progress Notes (Signed)
 Palliative:  Consult received to complete MOST form with Thomas [name]. Per chart review, patient is typically alert and oriented; able to make decisions independently. He had liver biopsy earlier today and received fentanyl  and versed . Will postpone MOST form discussion until tomorrow to allow time between discussion and sedative administration.  Tobey Jama Barnacle, DNP, AGNP-C Palliative Medicine Team Team Phone # [phone]  Pager # [phone]  NO CHARGE

## 2023-05-12 NOTE — Progress Notes (Signed)
 Per Dr. Truett Perna: Schedule for OV in 2 weeks (no lab). Scheduling message sent

## 2023-05-12 NOTE — Progress Notes (Signed)
 PHARMACY - TOTAL PARENTERAL NUTRITION CONSULT NOTE   Indication: Prolonged ileus  Patient Measurements: Height: 5' 8 (172.7 cm) Weight: 95.3 kg (210 lb 1.6 oz) IBW/kg (Calculated) : 68.4 TPN AdjBW (KG): 74.5 Body mass index is 31.95 kg/m. Usual Weight: 93 kg- patient reports no weight changes in the past 3 months  Assessment: 71 YOM admitted for a planned lap VHR on 04/29/2023. Pt with minimal oral intake 2/2 prolonged ileus. He is positive for flatus but has slowing JP drain OP. He has had a couple bouts of green emesis and no BM in 5 days. Pharmacy was consulted for TPN initiation and management.  Glucose / Insulin : History of T2DM; Hgb A1C 5.6 on 04/26/2023. CBG's 110-120's; 0 units of sSSI insulin  given Electrolytes: Na: 134, K 4.0 (goal >/= 4 with ileus), HCO3 17, Mag:1.7 (goal >/= 2 with ileus), Phos: 2.8 Renal: BUN 30s, Scr 1.52 (baseline appears around 1.3-1.5)  Hepatic: 1/2- Albumin  2.5, LFTs wnl, Alk phos: 46, TG: 92  Intake / Output; MIVF: UOP 0.3 ml/kg/hr, NGT accidentally removed 12/29 holding off on replacing, LBM 12/31 GI Imaging: 12/25 CT Abd- probable small bowel obstruction; small pockets of air in anterior adb may be within collapsed loop of small bowel 05/04/2023. Favor ileus over mechanical bowel obstruction. 12/26 DG ABD- persistent small bowel dilatation noted likely related to postop ileus 12/27 DG Abd - Bowel-gas pattern not significantly improved since the CT on 12/28 CT Abd - persistent but decreased small bowel distention  12/31 MR liver - Persistent dilated loops of small and large bowel with air-fluid levels and surgical changes along the anterior abdominal wall. Growth of liver lesion.  GI Surgeries / Procedures:  12/20 Lap VHR w/ mesh  Central access: 12/26 TPN start date: 12/26  Nutritional Goals: Goal TPN rate is 70 mL/hr  + SMOF lipid at 20.8ml/hr x 12 hours provides 134 g of protein and 1608 kcals per day  Due to fluid shortages caused by  [facility], Clinimix  will be utilized to meet his nutritional needs as able.  RD Assessment: Estimated Needs Total Energy Estimated Needs: 2000-2200 kcal/d Total Protein Estimated Needs: 105-120g/d Total Fluid Estimated Needs: 2-2.2 L/d  Current Nutrition:  Clear liquids started 12/29, advanced to FLD on 12/31 - limited intake  TPN Clinimix  8/10 at 70 ml/hr + SMOF lipids (20.36ml/hr x 12 hrs) provides 134 gm protein and 1600 kcal  Boost BID (x2 on 12/31, none 1/1)   Plan:  Continue Clinimix  8/10 w/o E-lytes at 70 mL/hr with SMOF lipid 20.8 ml/hr x12 hours daily. This will provide 134 grams of protein and 1600 Kcal (meeting 111% of estimated protein needs and 80% of kcal needs). Magnesium  sulfate 2g IV x 1 Add standard MVI and trace elements to TPN Continue Sensitive Q8H SSI and adjust as needed, potential d/c in upcoming days.  Monitor TPN labs on Mon/Thurs. Daily BMP, Mg/Phos as needed. Patient requiring frequent electrolyte replacement given utilizing Clinimix  w/o E.  Follow-up toleration of full liquid diet and ability to wean TPN.   Dorn Poot, PharmD, [facility] Clinical Pharmacist ED Pharmacist Phone # [phone] 05/12/2023 7:29 AM

## 2023-05-12 NOTE — Consult Note (Signed)
 Chief Complaint: Patient was seen in consultation today for liver lesion  Referring Physician(s): Dr. Cloretta  Supervising Physician: Jennefer Rover  Patient Status: [facility] - In-pt  History of Present Illness: Thomas [name] is a 68 y.o. male with past medical history of transverse colon cancer, stage III who presents admitted for incisional hernia repair on 12/20 noted to to have an enlarging right hepatic lesion.  IR consulted for liver lesion biopsy at the request of Dr. Cloretta.  Case reviewed and approved by Dr. Katha.    Patient assessed at bedside.  He is aware of the goals of the procedure.  He is aware lesion visibilty may be discordant between MR and US , however goal is sample for tissue diagnosis.  He is agreeable to proceed.   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   Acute renal failure with acute cortical necrosis (HCC)    Acute respiratory failure with hypoxemia (HCC) 12/07/2020   Acute respiratory failure with hypoxia (HCC) 04/09/2014   Anxiety    Brain tumor (HCC)    frontal   Chronic kidney disease    Colon cancer (HCC)    Confusion    occasionally   Depression    Diabetes mellitus without complication (HCC)    Dizziness    Duodenal ulcer disease    Epilepsy (HCC)    takes Keppra  daily   GERD (gastroesophageal reflux disease)    takes Omeprazole  daily   Gout    Headache    Heart murmur    Hyperlipidemia    takes Atorvastatin  daily   Hypertension    Meningioma (HCC) 09/01/2016   Peripheral edema    takes Lasix  daily   Peripheral neuropathy    takes Gabapentin  daily   Seizures (HCC)    Sleep apnea    no cpap   Urinary frequency    Viral gastroenteritis 09/29/2021    Past Surgical History:  Procedure Laterality Date   BIOPSY  09/05/2020   Procedure: BIOPSY;  Surgeon: Abran Norleen SAILOR,  MD;  Location: [facility] ENDOSCOPY;  Service: Endoscopy;;   BIOPSY  02/06/2021   Procedure: BIOPSY;  Surgeon: Albertus Gordy HERO, MD;  Location: [facility] ENDOSCOPY;  Service: Gastroenterology;;   CARDIAC CATHETERIZATION  7 yrs ago   COLONOSCOPY WITH PROPOFOL  N/A 09/05/2020   Procedure: COLONOSCOPY WITH PROPOFOL ;  Surgeon: Abran Norleen SAILOR, MD;  Location: [facility] ENDOSCOPY;  Service: Endoscopy;  Laterality: N/A;   CRANIOTOMY N/A 09/01/2016   Procedure: CRANIOTOMY TUMOR  LEFT PTERIONAL;  Surgeon: Rockey Peru, MD;  Location: [facility] OR;  Service: Neurosurgery;  Laterality: N/A;   cyst removed from chest      as a child   ESOPHAGOGASTRODUODENOSCOPY (EGD) WITH PROPOFOL  N/A 02/06/2021   Procedure: ESOPHAGOGASTRODUODENOSCOPY (EGD) WITH PROPOFOL ;  Surgeon: Albertus Gordy HERO, MD;  Location: [facility] ENDOSCOPY;  Service: Gastroenterology;  Laterality: N/A;   EYE SURGERY Bilateral 02/08/2023   INGUINAL HERNIA REPAIR N/A 07/01/2022   Procedure: HERNIA REPAIR INGUINAL INCARCERATED;  Surgeon: Sheldon Standing, MD;  Location: [facility] ORS;  Service: General;  Laterality: N/A;   IR FLUORO GUIDE CV LINE LEFT  12/22/2020   IR GASTROSTOMY TUBE MOD SED  02/10/2021   IR GASTROSTOMY TUBE REMOVAL  04/15/2021   IR REMOVAL TUN CV CATH W/O FL  03/09/2021   IR US  GUIDE VASC ACCESS LEFT  12/22/2020   LAPAROSCOPY  04/29/2023   Procedure: LAPAROSCOPY DIAGNOSTIC;  Surgeon: Paola Dreama SAILOR, MD;  Location: [facility] OR;  Service: General;;   LYSIS OF ADHESION N/A 07/01/2022   Procedure: LYSIS OF ADHESION;  Surgeon: Sheldon Standing, MD;  Location: [facility] ORS;  Service: General;  Laterality: N/A;   NO PAST SURGERIES     PARTIAL COLECTOMY N/A 09/10/2020   Procedure: TRANSVERSE COLECTOMY;  Surgeon: Paola Dreama SAILOR, MD;  Location: [facility] OR;  Service: General;  Laterality: N/A;   POLYPECTOMY  09/05/2020   Procedure: POLYPECTOMY;  Surgeon: Abran Norleen SAILOR, MD;  Location: [facility] ENDOSCOPY;  Service: Endoscopy;;   PORT-A-CATH REMOVAL N/A 10/08/2022   Procedure: REMOVAL PORT-A-CATH;  Surgeon:  Paola Dreama SAILOR, MD;  Location: [facility] OR;  Service: General;  Laterality: N/A;  46   PORTACATH PLACEMENT Right 10/01/2020   Procedure: INSERTION PORT-A-CATH;  Surgeon: Paola Dreama SAILOR, MD;  Location: [facility] OR;  Service: General;  Laterality: Right;   SUBMUCOSAL TATTOO INJECTION  09/05/2020   Procedure: SUBMUCOSAL TATTOO INJECTION;  Surgeon: Abran Norleen SAILOR, MD;  Location: [facility] ENDOSCOPY;  Service: Endoscopy;;   TRACHEOSTOMY TUBE PLACEMENT N/A 01/05/2021   Procedure: TRACHEOSTOMY;  Surgeon: Jesus Oliphant, MD;  Location: [facility] OR;  Service: ENT;  Laterality: N/A;   VENTRAL HERNIA REPAIR N/A 04/29/2023   Procedure: VENTRAL HERNIA REPAIR WITH MESH;  Surgeon: Paola Dreama SAILOR, MD;  Location: [facility] OR;  Service: General;  Laterality: N/A;   XI ROBOTIC ASSISTED COLOSTOMY TAKEDOWN N/A 07/01/2022   Procedure: ROBOTIC OSTOMY TAKEDOWN WITH INTRACORPOREAL ANASTOMOSIS WITH FIREFLY;  Surgeon: Sheldon Standing, MD;  Location: [facility] ORS;  Service: General;  Laterality: N/A;    Allergies: Morphine  and codeine  Medications: Prior to Admission medications   Medication Sig Start Date End Date Taking? Authorizing Provider  allopurinol  (ZYLOPRIM ) 100 MG tablet Take 100 mg by mouth at bedtime. 02/15/22  Yes [provider]  amLODipine  (NORVASC ) 10 MG tablet Take 10 mg by mouth in the morning. 05/11/20  Yes [provider]  atorvastatin  (LIPITOR) 20 MG tablet Take 20 mg by mouth in the morning.   Yes [provider]  Emollient (UDDERLY SMOOTH EX) Apply 1 Application topically as needed (dry/irritated skin).   Yes [provider]  gabapentin  (NEURONTIN ) 100 MG capsule Take 100-200 mg by mouth See admin instructions. Take 1 capsule (100 mg) by mouth in the morning & take 2 capsules (200 mg) by mouth in the evening.   Yes [provider]  levETIRAcetam  (KEPPRA ) 500 MG tablet Take 500 mg by mouth 2 (two) times daily. 08/27/21  Yes [provider]  LORazepam  (ATIVAN ) 0.5 MG tablet Take 0.5  mg by mouth 2 (two) times daily as needed for anxiety.   Yes [provider]  omeprazole  (PRILOSEC) 20 MG capsule Take 20 mg by mouth daily before breakfast.   Yes [provider]  Polyethyl Glycol-Propyl Glycol 0.4-0.3 % SOLN Place 1-2 drops into both eyes 3 (three) times daily as needed (dry/irritated eyes.).   Yes [provider]  acetaminophen  (TYLENOL ) 500 MG tablet Take 2 tablets (1,000 mg total) by mouth every 6 (six) hours. Patient taking differently: Take 1,000 mg by mouth every 6 (six) hours as needed. 10/08/22   Paola Dreama SAILOR, MD  ibuprofen  (ADVIL ) 600 MG tablet Take 1 tablet (600 mg total) by mouth 4 (four) times daily. 10/08/22   Paola Dreama SAILOR, MD     Family History  Problem Relation Age of Onset   Breast cancer  Mother    Breast cancer Maternal Aunt    Prostate cancer Maternal Uncle    Colon cancer Neg Hx    Esophageal cancer Neg Hx    Rectal cancer Neg Hx    Stomach cancer Neg Hx     Social History   Socioeconomic History   Marital status: Married    Spouse name: Mahalia   Number of children: 4   Years of education: Not on file   Highest education level: Not on file  Occupational History   Occupation: Disabled  Tobacco Use   Smoking status: Former    Types: Cigarettes   Smokeless tobacco: Never   Tobacco comments:    quit smoking 35 yrs ago  Vaping Use   Vaping status: Never Used  Substance and Sexual Activity   Alcohol  use: No   Drug use: No   Sexual activity: Not on file  Other Topics Concern   Not on file  Social History Narrative   Married to his wife, Abel with total of #4 children (#2 with current wife). Lives in home with his son and daughter-in-law.     Social Drivers of Corporate Investment Banker Strain: Not on file  Food Insecurity: No Food Insecurity (04/29/2023)   Hunger Vital Sign    Worried About Running Out of Food in the Last Year: Never true    Ran Out of Food in the Last Year: Never true   Transportation Needs: No Transportation Needs (04/29/2023)   PRAPARE - Administrator, Civil Service (Medical): No    Lack of Transportation (Non-Medical): No  Physical Activity: Not on file  Stress: Not on file  Social Connections: Unknown (05/10/2023)   Social Connection and Isolation Panel [NHANES]    Frequency of Communication with Friends and Family: Never    Frequency of Social Gatherings with Friends and Family: Never    Attends Religious Services: Never    Database Administrator or Organizations: No    Attends Engineer, Structural: Never    Marital Status: Patient declined     Review of Systems: A 12 point ROS discussed and pertinent positives are indicated in the HPI above.  All other systems are negative.  Review of Systems  Constitutional:  Negative for fatigue and fever.  Respiratory:  Negative for cough and shortness of breath.   Cardiovascular:  Negative for chest pain.  Gastrointestinal:  Positive for abdominal pain.  Skin:  Negative for pallor.  Psychiatric/Behavioral:  Negative for behavioral problems and confusion.     Vital Signs: BP (!) 162/57 (BP Location: Left Wrist)   Pulse 76   Temp 98.4 F (36.9 C) (Oral)   Resp 17   Ht 5' 8 (1.727 m)   Wt 210 lb 1.6 oz (95.3 kg)   SpO2 99%   BMI 31.95 kg/m   Physical Exam Vitals and nursing note reviewed.  Constitutional:      General: He is not in acute distress.    Appearance: Normal appearance. He is not ill-appearing.  Cardiovascular:     Rate and Rhythm: Normal rate and regular rhythm.  Pulmonary:     Effort: Pulmonary effort is normal.     Breath sounds: Normal breath sounds.  Abdominal:     General: Abdomen is flat.     Palpations: Abdomen is soft.     Comments: Incision intact.  Dressing in place.   Skin:    General: Skin is warm and dry.  Neurological:  General: No focal deficit present.     Mental Status: He is alert and oriented to person, place, and time. Mental  status is at baseline.  Psychiatric:        Mood and Affect: Mood normal.        Behavior: Behavior normal.        Thought Content: Thought content normal.        Judgment: Judgment normal.      MD Evaluation Airway: WNL Heart: WNL Abdomen: WNL Chest/ Lungs: WNL ASA  Classification: 3 Mallampati/Airway Score: Two   Imaging: MR LIVER W WO CONTRAST Result Date: 05/10/2023 CLINICAL DATA:  History of colon cancer.  Liver lesion. EXAM: MRI ABDOMEN WITHOUT AND WITH CONTRAST TECHNIQUE: Multiplanar multisequence MR imaging of the abdomen was performed both before and after the administration of intravenous contrast. CONTRAST:  10mL GADAVIST  GADOBUTROL  1 MMOL/ML IV SOLN COMPARISON:  Numerous CT scans going back several years including most recent from 04/29/2023 but going back to 2022. Many are without contrast however. FINDINGS: Lower chest: Trace fluid along the lung bases. Hepatobiliary: As seen on the CT there is a cystic lesion identified in the liver anterolateral right measuring on today's examination 18 by 12 mm. The central portion lesion is heterogeneous moderately bright on T2, low on T1 with marginal enhancement on the dynamic examination. Going back to older examinations from March 2024 the lesion measured 11 x 7 mm, slowly increasing in size. No other aggressive liver lesion identified at this time. Patent portal vein. No biliary ductal dilatation. Gallbladder is mildly distended. Pancreas: No mass, inflammatory changes, or other parenchymal abnormality identified. Spleen:  Within normal limits in size and appearance. Adrenals/Urinary Tract: Slight thickening of the adrenal glands bilaterally, nonspecific. There are Bosniak 1 bilateral renal cysts identified. Largest is on the left measuring 4.7 cm. Bosniak 1 lesions. No specific imaging follow up. Stomach/Bowel: Stomach is mildly distended with fluid. The colon seen in the upper abdomen is dilated up to 9.2 cm with an air-fluid level.  There also some fluid-filled distended loops of small bowel in the left upper abdomen with air-fluid levels measuring up to 4.3 cm. Please correlate for etiology including obstruction or ileus. Appearance is similar to that of the prior. Vascular/Lymphatic: Atherosclerotic changes along the aorta and branch vessels with areas of potential stenosis. Retroaortic left renal vein. Preserved IVC caliber. No specific abnormal lymph node enlargement seen in the abdomen. Other: Once again there is fluid and stranding along the anterior abdominal wall. Postsurgical changes with clips. Drain is not as well seen on this examination of the abdomen in the liver. Motion seen throughout the examination. Musculoskeletal: No suspicious bone lesions identified. IMPRESSION: Liver lesion once again identified. This has some aggressive features and has a shown some slow interval growth. In the setting of history of colon cancer this has worrisome for a potential metastatic lesion. No additional liver lesions identified at this time. Persistent dilated loops of small and large bowel with air-fluid levels and surgical changes along the anterior abdominal wall. Please correlate with clinical history and presentation. Motion. Electronically Signed   By: Ranell Bring M.D.   On: 05/10/2023 15:15   CT CHEST WO CONTRAST Result Date: 05/10/2023 CLINICAL DATA:  Occult malignancy. Liver lesion. * Tracking Code: BO * EXAM: CT CHEST WITHOUT CONTRAST TECHNIQUE: Multidetector CT imaging of the chest was performed following the standard protocol without IV contrast. RADIATION DOSE REDUCTION: This exam was performed according to the departmental dose-optimization program which includes  automated exposure control, adjustment of the mA and/or kV according to patient size and/or use of iterative reconstruction technique. COMPARISON:  Abdomen and pelvis CT 05/07/2023. Chest CT scan 07/03/2022. FINDINGS: Cardiovascular: Coronary artery calcifications are  seen. Thoracic aorta on this non IV contrast exam has a normal course and caliber with scattered vascular calcifications as well. Heart is nonenlarged. No significant pericardial effusion. Right upper extremity PICC with the tip along the central SVC. Mediastinum/Nodes: Thyroid  gland is preserved. Normal caliber thoracic esophagus. No specific abnormal lymph node enlargement present on this noncontrast examination in the axillary region, hilum or mediastinum. There is a nodular area along the trachea inferiorly but non dependent on series 4, image 51 measuring 10 by 6 mm. Favor atypical debris or mucous with some marginal bubbles of air but lesion is difficult to completely exclude. Recommend either further workup or short follow up when appropriate. Lungs/Pleura: Bandlike opacities identified in the left lower lobe anteriorly, similar to previous. The dependent areas with a tiny left effusion are also stable from prior chest CT. On the prior there was consolidation of the right lower lobe which has improved. There is some linear changes in the right lower lobe. Atelectasis or scarring. There are some new patchy parenchymal opacities with some air bronchograms in the dependent right upper lobe. A new area of infiltrate is possible. No pneumothorax. Upper Abdomen: Please see separate abdomen and pelvis CT with contrast. Musculoskeletal: Mild degenerative changes along the spine. IMPRESSION: Persistent areas of bandlike opacities, similar to previous left lower lobe. There is some new opacity in the right upper lobe which may be a new infiltrate or pneumonia. Recommend follow-up. Non dependent nodular areas along the lower trachea. Based on appearance favor atypical debris but would recommend short follow up to confirm resolution. Aortic Atherosclerosis (ICD10-I70.0). Electronically Signed   By: Ranell Bring M.D.   On: 05/10/2023 11:34   CT ABDOMEN PELVIS W CONTRAST Result Date: 05/07/2023 CLINICAL DATA:   Intra-abdominal abscess.  History of mesh infection. EXAM: CT ABDOMEN AND PELVIS WITH CONTRAST TECHNIQUE: Multidetector CT imaging of the abdomen and pelvis was performed using the standard protocol following bolus administration of intravenous contrast. RADIATION DOSE REDUCTION: This exam was performed according to the departmental dose-optimization program which includes automated exposure control, adjustment of the mA and/or kV according to patient size and/or use of iterative reconstruction technique. CONTRAST:  60mL OMNIPAQUE  IOHEXOL  350 MG/ML SOLN COMPARISON:  05/04/2023 FINDINGS: Lower chest: Patchy densities at lung bases are suggestive for areas of atelectasis and/or scarring. No large pleural effusions. Again noted is a tiny calcified granuloma in the left lower lobe on image 2/4. Hepatobiliary: 1.9 cm hypodensity in the anterior right hepatic lobe on image 12/3 and measured 1.1 cm on 07/21/2022. This is concerning for a metastatic lesion based on the history of colon cancer. No other definite hepatic lesions. Normal appearance of the gallbladder. Main portal venous system is patent. Pancreas: Unremarkable. No pancreatic ductal dilatation or surrounding inflammatory changes. Spleen: Normal in size without focal abnormality. Adrenals/Urinary Tract: Normal adrenal glands. Bilateral renal cysts that do not require dedicated follow-up. No suspicious renal lesion. No hydronephrosis. Mild distention of the urinary bladder. Stomach/Bowel: Nasogastric tube terminates in the proximal stomach. Again noted are dilated loops of small bowel in the mid abdomen. There is mesenteric edema in the central mesentery and these findings are best seen on image 56/3. The degree of small bowel distension has slightly decreased since 05/04/2023. There is oral contrast in the distal  small bowel. Dilated colon in the anterior abdomen containing stool and gas. Vascular/Lymphatic: Aortic atherosclerosis. No enlarged abdominal or  pelvic lymph nodes. Reproductive: No acute abnormality involving the prostate. Other: Bilateral inguinal hernias containing fat, left side is larger than right. Postsurgical changes along the anterior abdominal wall with a surgical drain in the anterior subcutaneous tissues. Again noted is high-density material surrounding the surgical drain that likely represents hematoma. This high-density material roughly measures 8.6 x 3.2 cm on image 49/3 and minimally changed from the recent comparison examination. There is no significant low-density fluid surrounding the drain or anterior subcutaneous tissues. No discrete fluid collection along the anterior abdominal wall. Musculoskeletal: No acute bone abnormality. IMPRESSION: 1. Enlarging right hepatic lesion. Based on the history of colon cancer, this raises concern for metastatic disease. Recommend further characterization with an abdominal MRI, with and without contrast. 2. Persistent but decreased small bowel distension. Persistent edema in the central mesentery around the dilated loops of small bowel. 3. Postoperative changes along the anterior abdominal wall. There is a surgical drain in the anterior subcutaneous tissues surrounded by hyperdense material. This hyperdense material likely represents hematoma and minimally changed from the exam on 05/04/2023. 4.  Aortic Atherosclerosis (ICD10-I70.0). 5. Atelectasis and/or scarring at the lung bases. Electronically Signed   By: Juliene Balder M.D.   On: 05/07/2023 16:02   DG Abd 1 View Result Date: 05/06/2023 CLINICAL DATA:  [id number] Encounter for nasogastric (NG) tube placement [id number] EXAM: ABDOMEN - 1 VIEW COMPARISON:  Earlier film of the same day FINDINGS: Gastric tube loops in the decompressed stomach. A few gas distended bowel loops in the visualized upper abdomen. Lower abdomen excluded. IMPRESSION: Gastric tube loops in the decompressed stomach. Electronically Signed   By: JONETTA Faes M.D.   On: 05/06/2023 15:14    DG Abd 1 View Result Date: 05/06/2023 CLINICAL DATA:  68 year old male with postoperative abdominal pain, obstruction or ileus. EXAM: ABDOMEN - 1 VIEW COMPARISON:  Abdominal radiographs yesterday. CT Abdomen and Pelvis 05/04/2023. FINDINGS: Portable AP supine view at 0911 hours. Enteric tube now terminates in the proximal stomach, the side hole may be at the distal esophagus. Small volume of contrast in the gastric fundus has decreased since yesterday. Otherwise stable bowel gas pattern since the CT on 05/04/2023, with gas distended small and large bowel loops. Superficial mid abdominal wall drain remains in place. Midline skin staples. No pneumoperitoneum evident on these supine views. Lung bases appear stable. Stable visualized osseous structures. IMPRESSION: 1. Enteric tube terminates in the stomach but side hole may be in the distal esophagus. Recommend advancing 6 cm to ensure side hole placement within the stomach. 2. Bowel-gas pattern not significantly improved since the CT on 05/04/2023. Favor ileus over mechanical bowel obstruction. Electronically Signed   By: VEAR Hurst M.D.   On: 05/06/2023 10:38   DG Abd Portable 1V-Small Bowel Obstruction Protocol-initial, 8 hr delay Result Date: 05/05/2023 CLINICAL DATA:  Follow up small bowel obstruction EXAM: PORTABLE ABDOMEN - 1 VIEW COMPARISON:  Film from earlier in the same day. FINDINGS: Gastric catheter is noted within the stomach. Contrast is noted within the stomach. Multiple dilated loops of small bowel are seen. No significant small bowel contrast is noted. IMPRESSION: Administered contrast lies within the stomach. Persistent small bowel dilatation is noted likely related to a postop ileus. Electronically Signed   By: Oneil Devonshire M.D.   On: 05/05/2023 20:23   DG Abd Portable 1V-Small Bowel Protocol-Position Verification Result Date: 05/05/2023 CLINICAL DATA:  Nasogastric tube placement. EXAM: PORTABLE ABDOMEN - 1 VIEW COMPARISON:  Same day.  FINDINGS: Distal tip of nasogastric tube is seen in proximal stomach. IMPRESSION: Distal tip of nasogastric tube seen in proximal stomach. Electronically Signed   By: Lynwood Landy Raddle M.D.   On: 05/05/2023 10:11   US  EKG SITE RITE Result Date: 05/05/2023 If Site Rite image not attached, placement could not be confirmed due to current cardiac rhythm.  DG Abd 1 View Result Date: 05/05/2023 CLINICAL DATA:  [id number] encounter for nasogastric tube placement. EXAM: ABDOMEN - 1 VIEW COMPARISON:  CT abdomen and pelvis no contrast yesterday at 7 a.m. FINDINGS: 2:25 a.m. NGT passes into the stomach curving to the left with the tip abutting the far wall of the proximal gastric body. There is contrast in the stomach. There is no visible small bowel contrast. There is a surgical drain overlying the central abdomen and overlying skin staples. Multiple dilated small bowel segments noted upper to mid abdomen, more so on the left. The degree of small-bowel dilatation is little if any changed, maximum small bowel caliber 4.3 cm. There is no supine evidence of free air but the CT does demonstrate a few scattered anterior air locules which could be free air. No pathologic calcifications or other significant findings are seen. Stable atelectasis left lung base. IMPRESSION: 1. NGT tip abuts the far wall of the proximal gastric body. 2. Contrast in the stomach. No visible small bowel contrast. 3. Multiple dilated small bowel segments, little if any changed. 4. No supine evidence of free air but the CT does demonstrate a few scattered anterior air locules which could be free air. Electronically Signed   By: Francis Quam M.D.   On: 05/05/2023 02:48   CT ABDOMEN PELVIS WO CONTRAST Result Date: 05/04/2023 CLINICAL DATA:  Postoperative abdominal pain. EXAM: CT ABDOMEN AND PELVIS WITHOUT CONTRAST TECHNIQUE: Multidetector CT imaging of the abdomen and pelvis was performed following the standard protocol without IV contrast. RADIATION  DOSE REDUCTION: This exam was performed according to the departmental dose-optimization program which includes automated exposure control, adjustment of the mA and/or kV according to patient size and/or use of iterative reconstruction technique. COMPARISON:  CT abdomen pelvis dated 07/23/2022. FINDINGS: Evaluation of this exam is limited in the absence of intravenous contrast. Lower chest: Left lung base linear atelectasis/scarring. The visualized lung bases are otherwise clear. There is coronary vascular calcification. Hepatobiliary: The liver is unremarkable. No biliary dilatation. The gallbladder is unremarkable. Pancreas: Unremarkable. No pancreatic ductal dilatation or surrounding inflammatory changes. Spleen: Normal in size without focal abnormality. Adrenals/Urinary Tract: The adrenal glands are unremarkable. There is no hydronephrosis or nephrolithiasis. Bilateral renal cysts measure up to 4.5 cm in the interpolar left kidney. The visualized ureters and urinary bladder appear unremarkable. Stomach/Bowel: Evaluation of the bowel is limited in the absence of oral contrast. There is postsurgical changes of bowel with anastomotic staple line in the transverse colon. There is abutment of loops of small bowel to the anterior peritoneal wall compatible with adhesions. Multiple dilated and fluid-filled loops of small bowel measure up to 4.2 cm. There is inflammatory changes of the respective mesentery. The distal small bowel demonstrate a normal caliber. Findings may represent ileus but suspicious for obstruction secondary to peritoneal adhesion in the anterior abdomen. Small-bowel series may provide better evaluation. The appendix is normal. Vascular/Lymphatic: Advanced aortoiliac atherosclerotic disease. The IVC is unremarkable. No portal venous gas. There is no adenopathy. Reproductive: The prostate and seminal vesicles are grossly unremarkable.  Other: Small free fluid within the pelvis. Small pockets of air in  the anterior abdomen (38/7) may be within a collapsed loop of small bowel. Extraluminal air is not excluded. Clinical correlation is recommended to exclude bowel perforation. Small amount of fluid in the anterior abdomen deep to the peritoneum measures approximately 1 x 6 cm. Musculoskeletal: Midline vertical anterior pelvic wall surgical incision and cutaneous clips. There is a high attenuating collection in the subcutaneous soft tissues of the anterior abdominal wall measuring approximately 3 x 9 cm in greatest axial dimension, likely a seroma. A drainage catheter is noted within this collection. There is osteopenia with degenerative changes spine. No acute osseous pathology. IMPRESSION: 1. Probable developing small-bowel obstruction with transition in the anterior abdomen due to peritoneal lesions. Small-bowel series may provide better evaluation. 2. Small pockets of air in the anterior abdomen may be within a collapsed loop of small bowel. Extraluminal air is not excluded. Clinical correlation is recommended to exclude bowel perforation. 3. Small free fluid within the pelvis. 4. Anterior abdominal wall seroma with a drainage catheter in place. 5.  Aortic Atherosclerosis (ICD10-I70.0). Electronically Signed   By: Vanetta Chou M.D.   On: 05/04/2023 20:12    Labs:  CBC: Recent Labs    05/09/23 0106 05/10/23 0321 05/11/23 0417 05/12/23 0358  WBC 14.7* 16.1* 16.4* 16.6*  HGB 9.4* 9.8* 9.3* 9.1*  HCT 27.0* 28.0* 27.0* 25.8*  PLT 276 314 301 315    COAGS: Recent Labs    05/12/23 0358  INR 1.1    BMP: Recent Labs    05/09/23 0106 05/10/23 0321 05/11/23 0417 05/12/23 0358  NA 137 133* 133* 134*  K 3.8 3.7 4.0 4.0  CL 108 107 109 110  CO2 20* 21* 18* 17*  GLUCOSE 123* 124* 125* 112*  BUN 35* 30* 34* 36*  CALCIUM  8.2* 8.3* 8.2* 8.3*  CREATININE 1.50* 1.33* 1.52* 1.52*  GFRNONAA 51* 59* 50* 50*    LIVER FUNCTION TESTS: Recent Labs    05/07/23 0106 05/08/23 0235  05/09/23 0106 05/12/23 0358  BILITOT 0.9 0.8 0.7 0.4  AST 15 15 14* 22  ALT 24 19 16 29   ALKPHOS 50 44 44 46  PROT 6.4* 6.3* 6.0* 5.8*  ALBUMIN  2.7* 2.6* 2.4* 2.5*    TUMOR MARKERS: Recent Labs    05/14/22 1014 09/10/22 0941  CEA 1.68 1.92    Assessment and Plan: Liver lesion Patient with history of colon cancer.  Liver lesion identified by MR.  IR consulted for biopsy. Case reviewed and approved by Dr. Katha.  INR 1.1.   Patient on TPN, has been NPO.   Risks and benefits of biopsy was discussed with the patient and/or patient's family including, but not limited to bleeding, infection, damage to adjacent structures or low yield requiring additional tests.  All of the questions were answered and there is agreement to proceed.  Consent signed and in chart.   Thank you for this interesting consult.  I greatly enjoyed meeting Thomas [name] and look forward to participating in their care.  A copy of this report was sent to the requesting provider on this date.  Electronically Signed: Mallarie Voorhies Sue-Ellen Mackenna Kamer, PA 05/12/2023, 9:18 AM   I spent a total of 40 Minutes    in face to face in clinical consultation, greater than 50% of which was counseling/coordinating care for liver lesion.

## 2023-05-12 NOTE — Progress Notes (Signed)
 Physical Therapy Treatment Patient Details Name: Thomas [name] MRN: [id number] DOB: [birth year] Today's Date: 05/12/2023   History of Present Illness The pt is a 68 yo male presenting 12/20 for repair of incisional hernia from colostomy takedown 06/2022 which was complicated by prolonged postop ileus. Incidentally identified enlarging hepatic lesion during above listed procedure, concern for metastasis. 05/12/23 plan for US  guided liver lesion bx by Dr. Katha with IR. PMH includes: OSA not on CPAP, seizures, brain tumor s/p resection 2018, peripheral edema, non-insulin -dependent DM2, HTN, GERD, peripheral neuropathy, colon cancer (s/p partial transverse colectomy and end colostomy 09/10/2020 followed by chemotherapy).    PT Comments  Received pt semi-reclined in bed agreeable to go for a walk prior to surgery. Pt performed bed mobility mod I with HOB elevated and use of bedrails with increased time. Removed socks and donned shoes with set up assist. Pt performed all transfers with IV pole for support and mod I. Pt ambulated ~424ft around floor with IV pole and mod I, then requested to return to room to toilet. Pt ambulated in/out of bathroom with IV pole mod I and able to perform 3/3 toileting tasks without assist. Pt left in recliner with all needs met. Do not anticipate need for follow up PT services at this time.     If plan is discharge home, recommend the following: Help with stairs or ramp for entrance;Assist for transportation;Assistance with cooking/housework   Can travel by private vehicle        Equipment Recommendations  None recommended by PT (pt has needed DME)    Recommendations for Other Services       Precautions / Restrictions Precautions Precautions: Fall Precaution Comments: abdominal surgery, abdominal binder Restrictions Weight Bearing Restrictions Per Provider Order: No     Mobility  Bed Mobility Overal bed mobility: Modified Independent             General bed  mobility comments: HOB elevated and use of bedrails with increased time Patient Response: Cooperative  Transfers Overall transfer level: Modified independent Equipment used: None Transfers: Sit to/from Stand             General transfer comment: stood from EOB and from toilet with increased time and support from IV pole    Ambulation/Gait Ambulation/Gait assistance: Modified independent (Device/Increase time) Gait Distance (Feet): 400 Feet Assistive device: IV Pole Gait Pattern/deviations: Step-through pattern, Decreased stride length, Narrow base of support   Gait velocity interpretation: 1.31 - 2.62 ft/sec, indicative of limited community ambulator   General Gait Details: slow and cautious gait pattern with decreased arm swing   Stairs             Wheelchair Mobility     Tilt Bed Tilt Bed Patient Response: Cooperative  Modified Rankin (Stroke Patients Only)       Balance Overall balance assessment: Modified Independent                                          Cognition Arousal: Alert Behavior During Therapy: WFL for tasks assessed/performed, Flat affect Overall Cognitive Status: Within Functional Limits for tasks assessed                                 General Comments: pleasant and cooperative        Exercises  General Comments General comments (skin integrity, edema, etc.): pt able to perform 3/3 toileting tasks mod I      Pertinent Vitals/Pain Pain Assessment Pain Score: 3  Pain Location: abdomen Pain Descriptors / Indicators: Discomfort, Sore Pain Intervention(s): Monitored during session, Repositioned    Home Living                          Prior Function            PT Goals (current goals can now be found in the care plan section) Acute Rehab PT Goals Patient Stated Goal: return home PT Goal Formulation: With patient Time For Goal Achievement: 05/07/23 Potential to Achieve  Goals: Good Progress towards PT goals: Progressing toward goals    Frequency           PT Plan      Co-evaluation              AM-PAC PT 6 Clicks Mobility   Outcome Measure  Help needed turning from your back to your side while in a flat bed without using bedrails?: None Help needed moving from lying on your back to sitting on the side of a flat bed without using bedrails?: None Help needed moving to and from a bed to a chair (including a wheelchair)?: None Help needed standing up from a chair using your arms (e.g., wheelchair or bedside chair)?: None Help needed to walk in hospital room?: None Help needed climbing 3-5 steps with a railing? : A Little 6 Click Score: 23    End of Session   Activity Tolerance: Patient tolerated treatment well Patient left: with call bell/phone within reach;in chair Nurse Communication: Mobility status PT Visit Diagnosis: Other abnormalities of gait and mobility (R26.89);Muscle weakness (generalized) (M62.81);Pain Pain - Right/Left:  (abdomen) Pain - part of body:  (abdomen)     Time: 9292-9280 PT Time Calculation (min) (ACUTE ONLY): 12 min  Charges:    $Therapeutic Activity: 8-22 mins PT General Charges $$ ACUTE PT VISIT: 1 Visit                     Therisa Stains PT, DPT Therisa HERO Zaunegger 05/12/2023, 7:59 AM

## 2023-05-12 NOTE — Progress Notes (Signed)
 General Surgery Follow Up Note  Subjective:    Overnight Issues:   Objective:  Vital signs for last 24 hours: Temp:  [98.1 F (36.7 C)-98.4 F (36.9 C)] 98.4 F (36.9 C) (01/02 0753) Pulse Rate:  [62-76] 68 (01/02 1030) Resp:  [11-18] 16 (01/02 1030) BP: (139-162)/(49-68) 146/62 (01/02 1030) SpO2:  [98 %-100 %] 100 % (01/02 1030)  Hemodynamic parameters for last 24 hours:    Intake/Output from previous day: 01/01 0701 - 01/02 0700 In: -  Out: 800 [Urine:800]  Intake/Output this shift: No intake/output data recorded.  Vent settings for last 24 hours:    Physical Exam:  Gen: comfortable, no distress Neuro: follows commands, alert, communicative HEENT: PERRL Neck: supple CV: RRR Pulm: unlabored breathing on RA Abd: soft, NT, incision clean, dry, intact  GU: urine clear and yellow, +spontaneous void Extr: wwp, no edema  Results for orders placed or performed during the hospital encounter of 04/29/23 (from the past 24 hours)  Glucose, capillary     Status: Abnormal   Collection Time: 05/11/23  4:04 PM  Result Value Ref Range   Glucose-Capillary 107 (H) 70 - 99 mg/dL  Protime-INR     Status: None   Collection Time: 05/12/23  3:58 AM  Result Value Ref Range   Prothrombin Time 14.4 11.4 - 15.2 seconds   INR 1.1 0.8 - 1.2  Comprehensive metabolic panel     Status: Abnormal   Collection Time: 05/12/23  3:58 AM  Result Value Ref Range   Sodium 134 (L) 135 - 145 mmol/L   Potassium 4.0 3.5 - 5.1 mmol/L   Chloride 110 98 - 111 mmol/L   CO2 17 (L) 22 - 32 mmol/L   Glucose, Bld 112 (H) 70 - 99 mg/dL   BUN 36 (H) 8 - 23 mg/dL   Creatinine, Ser 8.47 (H) 0.61 - 1.24 mg/dL   Calcium  8.3 (L) 8.9 - 10.3 mg/dL   Total Protein 5.8 (L) 6.5 - 8.1 g/dL   Albumin  2.5 (L) 3.5 - 5.0 g/dL   AST 22 15 - 41 U/L   ALT 29 0 - 44 U/L   Alkaline Phosphatase 46 38 - 126 U/L   Total Bilirubin 0.4 0.0 - 1.2 mg/dL   GFR, Estimated 50 (L) >60 mL/min   Anion gap 7 5 - 15  Magnesium       Status: None   Collection Time: 05/12/23  3:58 AM  Result Value Ref Range   Magnesium  1.7 1.7 - 2.4 mg/dL  Phosphorus     Status: None   Collection Time: 05/12/23  3:58 AM  Result Value Ref Range   Phosphorus 2.8 2.5 - 4.6 mg/dL  CBC     Status: Abnormal   Collection Time: 05/12/23  3:58 AM  Result Value Ref Range   WBC 16.6 (H) 4.0 - 10.5 K/uL   RBC 3.35 (L) 4.22 - 5.81 MIL/uL   Hemoglobin 9.1 (L) 13.0 - 17.0 g/dL   HCT 74.1 (L) 60.9 - 47.9 %   MCV 77.0 (L) 80.0 - 100.0 fL   MCH 27.2 26.0 - 34.0 pg   MCHC 35.3 30.0 - 36.0 g/dL   RDW 84.8 88.4 - 84.4 %   Platelets 315 150 - 400 K/uL   nRBC 0.0 0.0 - 0.2 %  Glucose, capillary     Status: Abnormal   Collection Time: 05/12/23  6:14 AM  Result Value Ref Range   Glucose-Capillary 109 (H) 70 - 99 mg/dL  Glucose, capillary  Status: None   Collection Time: 05/12/23 11:04 AM  Result Value Ref Range   Glucose-Capillary 88 70 - 99 mg/dL    Assessment & Plan:  Present on Admission:  Incisional hernia    LOS: 13 days   Additional comments:I reviewed the patient's new clinical lab test results.   and I reviewed the patients new imaging test results.    VHR with biologic mesh -+flatus and BM, incision cdi, JP drain out. Continue mobilization and pain control. Path negative. CT A/P 12/28 without any post-op changes. Remove staples today.  Incidentally identified enlarging hepatic lesion - suspect metastatic disease, CEA 2, noncon CT chest and MRI liver completed.  Plan  IR liver bx 1/2. Palliative consult also placed for GoC/MOST form, to see 1/3. Ileus vs SBO - limited PO intake yest, nausea x1 yest, and persistent SB distention on MRI yest ID - WBC had normalized, stable at 16 today, AF, urine with rare bacteria, no LE/nitr, CT chest not particularly remarkable, bcx 12/31 NGTD  AKI - back to baseline creatinine, continue to hydrate FEN - FLD, PICC/TPN, continue full TPN DVT - SCDs, SQH Dispo - med-surg   Thomas GEANNIE Hanger,  MD Trauma & General Surgery Please use [email or web address] to contact on call provider  05/12/2023  *Care during the described time interval was provided by me. I have reviewed this patient's available data, including medical history, events of note, physical examination and test results as part of my evaluation.

## 2023-05-12 NOTE — Procedures (Signed)
 Interventional Radiology Procedure Note  Procedure: US  Guided Biopsy of right lobe liver lesion  Complications: None  Estimated Blood Loss: < 10 mL  Findings: 18 G core biopsy of 0.9 x 1.4 cm right lobe liver lesion performed under US  guidance.  Three core samples obtained and sent to Pathology.  Marcey DASEN. Luverne, M.D Pager:  [phone]

## 2023-05-12 NOTE — Progress Notes (Signed)
 IP PROGRESS NOTE  Subjective:   Thomas [name] reports nausea 2 days ago.  None yesterday.  He is having bowel movements.  He wants to go home.  Objective: Vital signs in last 24 hours: Blood pressure (!) 162/57, pulse 76, temperature 98.4 F (36.9 C), temperature source Oral, resp. rate 17, height 5' 8 (1.727 m), weight 210 lb 1.6 oz (95.3 kg), SpO2 99%.  Intake/Output from previous day: 01/01 0701 - 01/02 0700 In: -  Out: 800 [Urine:800]  Physical Exam:   Abdomen:  firm fullness to the right of the midline incision, no hepatosplenomegaly Extremities: No leg edema  Portacath/PICC-without erythema  Lab Results: Recent Labs    05/11/23 0417 05/12/23 0358  WBC 16.4* 16.6*  HGB 9.3* 9.1*  HCT 27.0* 25.8*  PLT 301 315    BMET Recent Labs    05/11/23 0417 05/12/23 0358  NA 133* 134*  K 4.0 4.0  CL 109 110  CO2 18* 17*  GLUCOSE 125* 112*  BUN 34* 36*  CREATININE 1.52* 1.52*  CALCIUM  8.2* 8.3*    Lab Results  Component Value Date   CEA1 2.0 05/09/2023   CEA 1.92 09/10/2022    Studies/Results: MR LIVER W WO CONTRAST Result Date: 05/10/2023 CLINICAL DATA:  History of colon cancer.  Liver lesion. EXAM: MRI ABDOMEN WITHOUT AND WITH CONTRAST TECHNIQUE: Multiplanar multisequence MR imaging of the abdomen was performed both before and after the administration of intravenous contrast. CONTRAST:  10mL GADAVIST  GADOBUTROL  1 MMOL/ML IV SOLN COMPARISON:  Numerous CT scans going back several years including most recent from 04/29/2023 but going back to 2022. Many are without contrast however. FINDINGS: Lower chest: Trace fluid along the lung bases. Hepatobiliary: As seen on the CT there is a cystic lesion identified in the liver anterolateral right measuring on today's examination 18 by 12 mm. The central portion lesion is heterogeneous moderately bright on T2, low on T1 with marginal enhancement on the dynamic examination. Going back to older examinations from March 2024 the  lesion measured 11 x 7 mm, slowly increasing in size. No other aggressive liver lesion identified at this time. Patent portal vein. No biliary ductal dilatation. Gallbladder is mildly distended. Pancreas: No mass, inflammatory changes, or other parenchymal abnormality identified. Spleen:  Within normal limits in size and appearance. Adrenals/Urinary Tract: Slight thickening of the adrenal glands bilaterally, nonspecific. There are Bosniak 1 bilateral renal cysts identified. Largest is on the left measuring 4.7 cm. Bosniak 1 lesions. No specific imaging follow up. Stomach/Bowel: Stomach is mildly distended with fluid. The colon seen in the upper abdomen is dilated up to 9.2 cm with an air-fluid level. There also some fluid-filled distended loops of small bowel in the left upper abdomen with air-fluid levels measuring up to 4.3 cm. Please correlate for etiology including obstruction or ileus. Appearance is similar to that of the prior. Vascular/Lymphatic: Atherosclerotic changes along the aorta and branch vessels with areas of potential stenosis. Retroaortic left renal vein. Preserved IVC caliber. No specific abnormal lymph node enlargement seen in the abdomen. Other: Once again there is fluid and stranding along the anterior abdominal wall. Postsurgical changes with clips. Drain is not as well seen on this examination of the abdomen in the liver. Motion seen throughout the examination. Musculoskeletal: No suspicious bone lesions identified. IMPRESSION: Liver lesion once again identified. This has some aggressive features and has a shown some slow interval growth. In the setting of history of colon cancer this has worrisome for a potential metastatic lesion.  No additional liver lesions identified at this time. Persistent dilated loops of small and large bowel with air-fluid levels and surgical changes along the anterior abdominal wall. Please correlate with clinical history and presentation. Motion. Electronically  Signed   By: Ranell Bring M.D.   On: 05/10/2023 15:15    Medications: I have reviewed the patient's current medications.  Assessment/Plan: Transverse colon cancer, stage IIIb (T3N1a), status post a partial transverse colectomy and end colostomy 09/10/2020 CT abdomen/pelvis 09/04/2020- possible transverse colon mass with small regional lymph nodes Colonoscopy 09/05/2020- obstructing transverse colon mass-biopsy invasive adenocarcinoma, mass could not be passed CT chest 09/09/2020-no evidence of metastatic disease, 3 x 2 mm subpleural nodule in the right upper lobe likely benign subpleural lymph node Partial transverse colectomy 09/10/2020, transverse colon tumor, no lymphovascular perineural invasion, 1/19 lymph nodes positive, negative margins, MSI stable, no loss of mismatch repair protein expression Cycle 1 CAPOX 10/08/2020, oxaliplatin  dose reduced to 100 mg per metered squared secondary to renal insufficiency Cycle 2 CAPOX 10/29/2020 Cycle 3 CAPOX 11/19/2020, capecitabine  dose reduced secondary to hand/foot syndrome, oxaliplatin  dose reduced secondary to renal failure and poor performance status CT chest/abdomen/pelvis without contrast 12/01/2020-no acute abnormality in the chest, abdomen, or pelvis.  No evidence of obstruction. CTs 03/05/2022-no evidence of recurrent disease CT abdomen/pelvis 07/09/2022 and 07/21/2022-indeterminate segment 7 lesion measuring 1.2 cm CT chest 05/10/2023: New opacity in the right upper lobe-infiltrate?,  Nondependent nodular areas at the lower trachea favor atypical debris MRI abdomen 05/10/2023: 18 x 12 mm cystic lesion in the anterolateral right liver, increased in size from March 2024 Resection of a frontal meningioma 09/01/2016 Diabetes Rectal polyp- tubular adenoma on colonoscopy 09/05/2020 Hypertension Diabetic neuropathy Epilepsy Family history of breast and prostate cancer Admission with acute renal failure 10/01/2020-secondary to lack of oral intake and lisinopril ,  improved with intravenous hydration and holding lisinopril  Admission 12/01/2020-sepsis, diarrhea HHS, A. fib with RVR, abdominal pain 11.  Respiratory failure requiring intubation 12/06/2020 Tracheostomy 12/18/2020 12.  Progressive renal failure-CRRT started 12/06/2020, discontinued after 12/16/2020, hemodialysis initiated 13.  Thrombocytopenia secondary to chemotherapy and sepsis, heparin -induced platelet antibody and SRA negative-resolved 14.  Right lower extremity DVT diagnosed 12/12/2020-on apixaban   15.  Anemia secondary to chronic disease, renal failure, and phlebotomy 16.  Admission 09/29/2021 with rotavirus 17.  Admission 04/29/2023 for an open ventral hernia repair 18.  Postoperative ileus/small bowel obstruction December 2024 19.  Chronic renal failure/acute renal injury following the ventral hernia repair December 2024   Thomas [name] has a history of stage III colon cancer.  He has been in clinical remission following completion of adjuvant chemotherapy in 2022.  He is admitted following ventral hernia repair.  He has recovered from surgery with resolution of postoperative ileus. There is an enlarging right liver lesion on CT and MRI.  The lesion has features suspicious for a metastasis.  The CEA is normal.  He is scheduled for biopsy of the liver lesion today.  Thomas [name] may be a candidate for an ablation procedure if the biopsy confirms metastatic colon cancer.  There is no clinical evidence for additional sites of metastatic disease.  I will follow-up on the pathology from the liver biopsy and arrange for outpatient follow-up at the Cancer center.  Recommendations: Proceed with biopsy of the right liver lesion Continue postoperative care per Dr. Paola Outpatient follow-up will be scheduled at the Cancer center         LOS: 13 days   Thomas Hof, MD   05/12/2023, 8:00 AM

## 2023-05-12 NOTE — Plan of Care (Signed)

## 2023-05-13 DIAGNOSIS — K769 Liver disease, unspecified: Secondary | ICD-10-CM

## 2023-05-13 DIAGNOSIS — Z515 Encounter for palliative care: Secondary | ICD-10-CM

## 2023-05-13 DIAGNOSIS — Z7189 Other specified counseling: Secondary | ICD-10-CM

## 2023-05-13 LAB — BASIC METABOLIC PANEL
Anion gap: 5 (ref 5–15)
BUN: 41 mg/dL — ABNORMAL HIGH (ref 8–23)
CO2: 15 mmol/L — ABNORMAL LOW (ref 22–32)
Calcium: 8.6 mg/dL — ABNORMAL LOW (ref 8.9–10.3)
Chloride: 110 mmol/L (ref 98–111)
Creatinine, Ser: 1.54 mg/dL — ABNORMAL HIGH (ref 0.61–1.24)
GFR, Estimated: 49 mL/min — ABNORMAL LOW (ref >60)
Glucose, Bld: 118 mg/dL — ABNORMAL HIGH (ref 70–99)
Potassium: 4.3 mmol/L (ref 3.5–5.1)
Sodium: 130 mmol/L — ABNORMAL LOW (ref 135–145)

## 2023-05-13 LAB — PHOSPHORUS: Phosphorus: 2.9 mg/dL (ref 2.5–4.6)

## 2023-05-13 LAB — CBC
HCT: 28.1 % — ABNORMAL LOW (ref 39.0–52.0)
Hemoglobin: 9.8 g/dL — ABNORMAL LOW (ref 13.0–17.0)
MCH: 27.1 pg (ref 26.0–34.0)
MCHC: 34.9 g/dL (ref 30.0–36.0)
MCV: 77.8 fL — ABNORMAL LOW (ref 80.0–100.0)
Platelets: 369 10*3/uL (ref 150–400)
RBC: 3.61 MIL/uL — ABNORMAL LOW (ref 4.22–5.81)
RDW: 15.2 % (ref 11.5–15.5)
WBC: 23.6 10*3/uL — ABNORMAL HIGH (ref 4.0–10.5)
nRBC: 0 % (ref 0.0–0.2)

## 2023-05-13 LAB — MAGNESIUM: Magnesium: 1.9 mg/dL (ref 1.7–2.4)

## 2023-05-13 MED ORDER — TRACE MINERALS CU-MN-SE-ZN 300-55-60-3000 MCG/ML IV SOLN
INTRAVENOUS | Status: AC
  Filled 2023-05-13: qty 2000

## 2023-05-13 MED ORDER — MAGNESIUM SULFATE 2 GM/50ML IV SOLN
2.0000 g | Freq: Once | INTRAVENOUS | Status: AC
  Administered 2023-05-13: 2 g via INTRAVENOUS
  Filled 2023-05-13: qty 50

## 2023-05-13 MED ORDER — ENSURE ENLIVE PO LIQD
237.0000 mL | Freq: Three times a day (TID) | ORAL | Status: DC
  Administered 2023-05-15 – 2023-05-17 (×7): 237 mL via ORAL

## 2023-05-13 MED ORDER — FAT EMUL FISH OIL/PLANT BASED 20% (SMOFLIPID)IV EMUL
250.0000 mL | INTRAVENOUS | Status: AC
  Administered 2023-05-13: 250 mL via INTRAVENOUS
  Filled 2023-05-13 (×3): qty 250

## 2023-05-13 NOTE — Progress Notes (Addendum)
 Nutrition Follow-up  DOCUMENTATION CODES:   Not applicable  INTERVENTION:   -Continue full liquid diet. -Diet progression per surgeon -Change oral supplement to Ensure Enlive po TID, each supplement provides 350 kcal and 20 grams of protein. -Follow up on calorie count results. -TPN per pharmacy management.   NUTRITION DIAGNOSIS:   Inadequate oral intake related to vomiting as evidenced by NPO status.  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  12/20 - Op, open incisional hernia repair with biologic mesh, adhesiolysis, small bowel serosal repair x2  12/25 - Abdominal CT scan suspicious for small bowel obstruction 12/26 - TPN started, SBO/ileus  12/28 -  NGT accidentally removed holding off on replacing 12/29 - clear liquids started   01/02- NPO, IR guided biopsy of liver lesion 01/02- GI soft, calorie count x 48 hr ordered 01/03-full liquid diet   Patient has only ordered one meal of soft diet yesterday, D 1/2:  consumed 100% chicken broth, 2 teaspoons of mashed potato and 2 teaspoons of yogurt and 80% of Ensure Enlive for approximately 310 calories and 16 gm protein.  At L 1/3:  consumed half of potato soup and half gelatin for 109 calories and 2 gm protein. He had nausea, emesis overnight, still having bowel movements and pos flatus.  Patient does not think he will be able to eat more than broth today. Calorie Count enveloped to be hung on door in the event he is able to eat more than anticipated. Surgeon ordered 1/2 of TPN, without  lytes, continue with full liquid diet.    Current TPN Clinimix  8/10 at 35 ml/hr (840 mL) + SMOF lipids (20.8ml/hr x 12 hrs) provides 67 gm protein ( 64%) and 1054 kcal (53% of needs)      Medications reviewed and include colace, PPI, magnesium  sulfate 2 gm IV.  Labs reviewed  Diet Order             DIET full liquid: Thin  Diet effective now                   EDUCATION NEEDS:   No education needs have been identified at this time  Skin:  Skin Assessment: Skin Integrity Issues: Skin Integrity Issues:: Incisions, Other (Comment) Incisions: abdominal and abdominal port Other: pucture site R flank for liver bx  Last BM:  05/10/2023, type 6-small  Height:   Ht Readings from Last 1 Encounters:  04/29/23 5' 8 (1.727 m)    Weight:   Wt Readings from Last 1 Encounters:  05/13/23 92.9 kg    Ideal Body Weight:  70 kg  BMI:  Body mass index is 31.14 kg/m.  Estimated Nutritional Needs:   Kcal:  2000-2200 kcal/d  Protein:  105-120g/d  Fluid:  2-2.2 L/d    Elveria Sable, RDLD Clinical Dietitian If unable to reach, please contact RD Inpatient secure chat group between 8 am-4 pm daily

## 2023-05-13 NOTE — Progress Notes (Signed)
 Trauma/Critical Care Follow Up Note  Subjective:    Overnight Issues:   Objective:  Vital signs for last 24 hours: Temp:  [97.8 F (36.6 C)-98 F (36.7 C)] 97.8 F (36.6 C) (01/03 0758) Pulse Rate:  [71-76] 75 (01/03 0758) Resp:  [16-18] 16 (01/03 0758) BP: (123-162)/(56-70) 159/65 (01/03 0758) SpO2:  [96 %-100 %] 98 % (01/03 0758) Weight:  [92.9 kg] 92.9 kg (01/03 0500)  Hemodynamic parameters for last 24 hours:    Intake/Output from previous day: 01/02 0701 - 01/03 0700 In: 2259.1 [I.V.:1742.4; IV Piggyback:516.7] Out: 1000 [Urine:1000]  Intake/Output this shift: No intake/output data recorded.  Vent settings for last 24 hours:    Physical Exam:  Gen: comfortable, no distress Neuro: follows commands, alert, communicative HEENT: PERRL Neck: supple CV: RRR Pulm: unlabored breathing on RA Abd: soft, NT, incision clean, dry, intact , +BM GU: urine clear and yellow, +spontaneous voids Extr: wwp, no edema  Results for orders placed or performed during the hospital encounter of 04/29/23 (from the past 24 hours)  Glucose, capillary     Status: None   Collection Time: 05/12/23 11:04 AM  Result Value Ref Range   Glucose-Capillary 88 70 - 99 mg/dL  Glucose, capillary     Status: Abnormal   Collection Time: 05/12/23  4:14 PM  Result Value Ref Range   Glucose-Capillary 112 (H) 70 - 99 mg/dL  Glucose, capillary     Status: Abnormal   Collection Time: 05/12/23  8:46 PM  Result Value Ref Range   Glucose-Capillary 104 (H) 70 - 99 mg/dL  CBC     Status: Abnormal   Collection Time: 05/13/23  3:05 AM  Result Value Ref Range   WBC 23.6 (H) 4.0 - 10.5 K/uL   RBC 3.61 (L) 4.22 - 5.81 MIL/uL   Hemoglobin 9.8 (L) 13.0 - 17.0 g/dL   HCT 71.8 (L) 60.9 - 47.9 %   MCV 77.8 (L) 80.0 - 100.0 fL   MCH 27.1 26.0 - 34.0 pg   MCHC 34.9 30.0 - 36.0 g/dL   RDW 84.7 88.4 - 84.4 %   Platelets 369 150 - 400 K/uL   nRBC 0.0 0.0 - 0.2 %  Basic metabolic panel     Status: Abnormal    Collection Time: 05/13/23  3:05 AM  Result Value Ref Range   Sodium 130 (L) 135 - 145 mmol/L   Potassium 4.3 3.5 - 5.1 mmol/L   Chloride 110 98 - 111 mmol/L   CO2 15 (L) 22 - 32 mmol/L   Glucose, Bld 118 (H) 70 - 99 mg/dL   BUN 41 (H) 8 - 23 mg/dL   Creatinine, Ser 8.45 (H) 0.61 - 1.24 mg/dL   Calcium  8.6 (L) 8.9 - 10.3 mg/dL   GFR, Estimated 49 (L) >60 mL/min   Anion gap 5 5 - 15  Magnesium      Status: None   Collection Time: 05/13/23  3:05 AM  Result Value Ref Range   Magnesium  1.9 1.7 - 2.4 mg/dL  Phosphorus     Status: None   Collection Time: 05/13/23  7:41 AM  Result Value Ref Range   Phosphorus 2.9 2.5 - 4.6 mg/dL    Assessment & Plan:  Present on Admission:  Incisional hernia    LOS: 14 days   Additional comments:I reviewed the patient's new clinical lab test results.   and I reviewed the patients new imaging test results.    VHR with biologic mesh -+flatus and BM, incision cdi,  JP drain out. Continue mobilization and pain control. Path negative. CT A/P 12/28 without any post-op changes. Remove staples today.  Incidentally identified enlarging hepatic lesion - suspect metastatic disease, CEA 2, noncon CT chest and MRI liver completed.  Plan  IR liver bx 1/2. Palliative consult also placed for GoC/MOST form, to see 1/3. Ileus vs SBO - limited PO intake yest, nausea x1 yest, and persistent SB distention on MRI yest ID - WBC had normalized, stable at 16 today, AF, urine with rare bacteria, no LE/nitr, CT chest not particularly remarkable, bcx 12/31 NGTD  AKI - back to baseline creatinine, continue to hydrate FEN - soft diet yest, but emesis o/n, back to FLD, PICC/TPN, 1/2 TPN today, still having BM and flatus DVT - SCDs, SQH Dispo - med-surg   [name], MD Trauma & General Surgery Please use [email or web address] to contact on call provider  05/13/2023  *Care during the described time interval was provided by me. I have reviewed this patient's available data, including  medical history, events of note, physical examination and test results as part of my evaluation.

## 2023-05-13 NOTE — Plan of Care (Signed)

## 2023-05-13 NOTE — Progress Notes (Signed)
 PHARMACY - TOTAL PARENTERAL NUTRITION CONSULT NOTE   Indication: Prolonged ileus  Patient Measurements: Height: 5' 8 (172.7 cm) Weight: 92.9 kg (204 lb 12.9 oz) IBW/kg (Calculated) : 68.4 TPN AdjBW (KG): 74.5 Body mass index is 31.14 kg/m. Usual Weight: 93 kg- patient reports no weight changes in the past 3 months  Assessment: 46 YOM admitted for a planned lap VHR on 04/29/2023. Pt with minimal oral intake 2/2 prolonged ileus. He is positive for flatus but has slowing JP drain OP. He has had a couple bouts of green emesis and no BM in 5 days. Pharmacy was consulted for TPN initiation and management.  Glucose / Insulin : History of T2DM; Hgb A1C 5.6 on 04/26/2023. CBG's 88-118; 0 units of sSSI insulin  given Electrolytes: Na: 130, K 4.3 (goal >/= 4 with ileus), HCO3 15, Mag:1.9 - s/p 2g IV x1 (goal >/= 2 with ileus), Phos: 2.9 Renal: BUN 41, Scr 1.54 (baseline appears around 1.3-1.5)  Hepatic: 1/2- Albumin  2.5, LFTs wnl, Alk phos: 46, TG: 92  Intake / Output; MIVF: UOP 0.4 ml/kg/hr, NGT accidentally removed 12/29 holding off on replacing, LBM 12/31 GI Imaging: 12/25 CT Abd- probable small bowel obstruction; small pockets of air in anterior adb may be within collapsed loop of small bowel 05/04/2023. Favor ileus over mechanical bowel obstruction. 12/26 DG ABD- persistent small bowel dilatation noted likely related to postop ileus 12/27 DG Abd - Bowel-gas pattern not significantly improved since the CT on 12/28 CT Abd - persistent but decreased small bowel distention  12/31 MR liver - Persistent dilated loops of small and large bowel with air-fluid levels and surgical changes along the anterior abdominal wall. Growth of liver lesion.  GI Surgeries / Procedures:  12/20 Lap VHR w/ mesh 1/2 - liver lesion biopsy  Central access: 12/26 TPN start date: 12/26  Nutritional Goals: Goal TPN rate is 70 mL/hr  + SMOF lipid at 20.8ml/hr x 12 hours  Provides 134 g of protein and 1608 kcals per day  (meeting 111% of protein needs and 80% of kcal needs)  Due to fluid shortages caused by Lilian Hammersmith, Clinimix  will be utilized to meet his nutritional needs as able.  RD Assessment: Estimated Needs Total Energy Estimated Needs: 2000-2200 kcal/d Total Protein Estimated Needs: 105-120g/d Total Fluid Estimated Needs: 2-2.2 L/d  Current Nutrition:  Soft diet 1/2 >> TPN >> reduced to half on 1/3 Boost TID (accepted all on 1/2)  Plan:  Reduce Clinimix  8/10 (no lytes) to half rate of 35 mL/hr this evening with SMOF lipid 20.8 ml/hr x12 hours  Add standard MVI and trace elements to TPN D/c SSI as CBGs well controlled without needing insulin  Monitor TPN labs on Mon/Thurs and PRN. Patient requiring frequent electrolyte replacement given utilizing Clinimix  w/o E.  Magnesium  2g IV x1  Follow-up toleration of full liquid diet and ability to wean TPN.  Monitor PO intake - f/u ability to stop TPN on 1/4 vs keep at half another day  Prudence Dollar, PharmD, BCPS 05/13/2023 7:15 AM

## 2023-05-13 NOTE — Consult Note (Signed)
 Consultation Note Date: 05/13/2023   Patient Name: Thomas [name]  DOB: 04-08-1956  MRN: [id number]  Age / Sex: 68 y.o., male  PCP: Thomas Reynolds, NP Referring Physician: Paola Dreama SAILOR, MD  Reason for Consultation: Establishing goals of care  HPI/Patient Profile: 68 y.o. male  with past medical history of colon cancer, resection of frontal meningioma in 2018, diabetes, hypertension, diabetic neuropathy, epilepsy, tracheostomy in 2022, and CKD admitted on 04/29/2023 for open ventral hernia repair.  He had postop ileus and small bowel obstruction.  Incidentally right hepatic lesion was found suspicious for metastasis.  Patient has already underwent liver biopsy, awaiting results.  Follows with Thomas [name] at cancer center.  PMT was consulted by Dr. Paola to complete a MOST form.  Thomas Assessment and Goals of Care: I have reviewed medical records including EPIC notes, labs and imaging, assessed the patient and then met with patient to discuss diagnosis prognosis, GOC, EOL wishes, disposition and options.  Thomas [name] is sitting up in his chair appearing well today.  Very pleasant to talk to.  We discussed a brief life review of the patient.  Patient tells me about his family including his wife and 6 grandkids all under the age of 65.  He tells me about babysitting them frequently and would enjoy they are to him.  He tells me about his eli lilly and company service in the [employer] and [employer].  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  He tells me how important his faith is to him and how has guided him through his illnesses.  As far as functional and nutritional status he tells me he was doing quite well at home.  He was independent and able to care for himself.  He had a great appetite.   We discussed patient's current illness and what it means in the larger context of patient's on-going co-morbidities.  He tells me the current situation is shocking.  I  attempted to elicit values and goals of care important to the patient.  Thomas [name] tells me that at this point he is open to any medical care offered to prolong his life as he feels this is important due to his young grandchildren.  I completed a MOST form today. The patient and family outlined their wishes for the following treatment decisions:  Cardiopulmonary Resuscitation: Attempt Resuscitation (CPR)  Medical Interventions: Full Scope of Treatment: Use intubation, advanced airway interventions, mechanical ventilation, cardioversion as indicated, medical treatment, IV fluids, etc, also provide comfort measures. Transfer to the hospital if indicated  Antibiotics: Antibiotics if indicated  IV Fluids: IV fluids if indicated  Feeding Tube: Feeding tube for a defined trial period    Discussed with Thomas [name] the importance of continued conversation with family and the medical providers regarding overall plan of care and treatment options, ensuring decisions are within the context of the patient's values and GOCs.    Offered chaplain support to Thomas [name] which she was receptive of, he is hopeful to speak with chaplaincy.  Questions and concerns were addressed. The family was encouraged to call with questions or concerns.    Primary Decision Maker PATIENT He does share if he were unable to make decisions for himself he would want his wife to do so -she is next of kin, no advance directive needed  SUMMARY OF RECOMMENDATIONS   -MOST form completed as above -Full code/full scope -at this time, patient is open to all medical interventions offered to prolong life  Code Status/Advance  Care Planning: Full code      Primary Diagnoses: Present on Admission:  Incisional hernia   I have reviewed the medical record, interviewed the patient and family, and examined the patient. The following aspects are pertinent.  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   Acute renal failure with acute cortical necrosis (HCC)    Acute respiratory failure with hypoxemia (HCC) 12/07/2020   Acute respiratory failure with hypoxia (HCC) 04/09/2014   Anxiety    Brain tumor (HCC)    frontal   Chronic kidney disease    Colon cancer (HCC)    Confusion    occasionally   Depression    Diabetes mellitus without complication (HCC)    Dizziness    Duodenal ulcer disease    Epilepsy (HCC)    takes Keppra  daily   GERD (gastroesophageal reflux disease)    takes Omeprazole  daily   Gout    Headache    Heart murmur    Hyperlipidemia    takes Atorvastatin  daily   Hypertension    Meningioma (HCC) 09/01/2016   Peripheral edema    takes Lasix  daily   Peripheral neuropathy    takes Gabapentin  daily   Seizures (HCC)    Sleep apnea    no cpap   Urinary frequency    Viral gastroenteritis 09/29/2021   Social History   Socioeconomic History   Marital status: Married    Spouse name: Thomas [name]   Number of children: 4   Years of education: Not on file   Highest education level: Not on file  Occupational History   Occupation: Disabled  Tobacco Use   Smoking status: Former    Types: Cigarettes   Smokeless tobacco: Never   Tobacco comments:    quit smoking 35 yrs ago  Vaping Use   Vaping status: Never Used  Substance and Sexual Activity   Alcohol  use: No   Drug use: No   Sexual activity: Not on file  Other Topics Concern   Not on file  Social History Narrative   Married to his wife, Thomas [name] with total of #4 children (#2 with current wife). Lives in home with his son and daughter-in-law.     Social Drivers of Corporate Investment Banker Strain: Not on file  Food Insecurity: No Food Insecurity (04/29/2023)   Hunger Vital Sign    Worried About Running Out of Food in the Last Year: Never true    Ran Out of Food in the Last  Year: Never true  Transportation Needs: No Transportation Needs (04/29/2023)   PRAPARE - Administrator, Civil Service (Medical): No    Lack of Transportation (Non-Medical): No  Physical Activity: Not on file  Stress: Not on file  Social Connections: Unknown (05/10/2023)   Social Connection and Isolation Panel [NHANES]    Frequency of Communication with Friends and Family: Never    Frequency of Social Gatherings with Friends and Family: Never    Attends Religious Services: Never    Database Administrator or Organizations: No    Attends Engineer, Structural: Never    Marital Status: Patient declined   Family History  Problem Relation Age of Onset   Breast cancer Mother    Breast cancer Maternal Aunt    Prostate cancer Maternal Uncle  Colon cancer Neg Hx    Esophageal cancer Neg Hx    Rectal cancer Neg Hx    Stomach cancer Neg Hx    Scheduled Meds:  acetaminophen   1,000 mg Oral Q6H   allopurinol   100 mg Oral QHS   amLODipine   10 mg Oral q AM   atorvastatin   20 mg Oral q AM   Chlorhexidine  Gluconate Cloth  6 each Topical Daily   docusate sodium   100 mg Oral BID   feeding supplement  1 Container Oral TID BM   gabapentin   100 mg Oral q morning   gabapentin   200 mg Oral QHS   heparin  injection (subcutaneous)  5,000 Units Subcutaneous Q8H   levETIRAcetam   500 mg Oral BID   methocarbamol   1,000 mg Oral QID   pantoprazole   40 mg Oral Daily   sodium chloride  flush  10-40 mL Intracatheter Q12H   sodium chloride  flush  10-40 mL Intracatheter Q12H   Continuous Infusions:  TPN (CLINIMIX ) Adult without lytes     And   fat emul(SMOFlipid )     TPN (CLINIMIX ) Adult without lytes 70 mL/hr at 05/13/23 0840   PRN Meds:.diphenhydrAMINE , hydrALAZINE , HYDROmorphone  (DILAUDID ) injection, ondansetron  **OR** ondansetron  (ZOFRAN ) IV, oxyCODONE , sodium chloride  flush, sodium chloride  flush Allergies  Allergen Reactions   Morphine  And Codeine Other (See Comments)     Throat swelling (can tolerate oxycodone /hydrocodone /hydromorphone )   Review of Systems  Constitutional:  Positive for activity change and appetite change.  Gastrointestinal:  Positive for nausea.    Physical Exam Constitutional:      General: He is not in acute distress.    Appearance: He is ill-appearing.  Pulmonary:     Effort: Pulmonary effort is normal.  Skin:    General: Skin is warm and dry.  Neurological:     Mental Status: He is alert and oriented to person, place, and time.  Psychiatric:        Mood and Affect: Mood normal.        Behavior: Behavior normal.     Vital Signs: BP (!) 159/65 (BP Location: Left Arm)   Pulse 75   Temp 97.8 F (36.6 C) (Oral)   Resp 16   Ht 5' 8 (1.727 m)   Wt 92.9 kg   SpO2 98%   BMI 31.14 kg/m  Pain Scale: 0-10 POSS *See Group Information*: S-Acceptable,Sleep, easy to arouse Pain Score: Asleep   SpO2: SpO2: 98 % O2 Device:SpO2: 98 % O2 Flow Rate: .O2 Flow Rate (L/min): 2 L/min  IO: Intake/output summary:  Intake/Output Summary (Last 24 hours) at 05/13/2023 1407 Last data filed at 05/12/2023 2300 Gross per 24 hour  Intake 2259.05 ml  Output 1000 ml  Net 1259.05 ml    LBM: Last BM Date : 05/10/23 Baseline Weight: Weight: 93 kg Most recent weight: Weight: 92.9 kg     Palliative Assessment/Data:PPS 60%     *Please note that this is a verbal dictation therefore any spelling or grammatical errors are due to the [employer] One system interpretation.   Time Total: 55 minutes Time spent includes: Detailed review of medical records (labs, imaging, vital signs), medically appropriate exam, discussion with treatment team, counseling and educating patient, family and/or staff, documenting Thomas information, medication management and coordination of care.    Tobey Jama Barnacle, DNP, AGNP-C Palliative Medicine Team [phone] Pager: [phone]

## 2023-05-14 LAB — CBC
HCT: 29 % — ABNORMAL LOW (ref 39.0–52.0)
Hemoglobin: 10.2 g/dL — ABNORMAL LOW (ref 13.0–17.0)
MCH: 26.8 pg (ref 26.0–34.0)
MCHC: 35.2 g/dL (ref 30.0–36.0)
MCV: 76.3 fL — ABNORMAL LOW (ref 80.0–100.0)
Platelets: 415 10*3/uL — ABNORMAL HIGH (ref 150–400)
RBC: 3.8 MIL/uL — ABNORMAL LOW (ref 4.22–5.81)
RDW: 15.3 % (ref 11.5–15.5)
WBC: 20.1 10*3/uL — ABNORMAL HIGH (ref 4.0–10.5)
nRBC: 0 % (ref 0.0–0.2)

## 2023-05-14 LAB — BASIC METABOLIC PANEL
Anion gap: 9 (ref 5–15)
BUN: 41 mg/dL — ABNORMAL HIGH (ref 8–23)
CO2: 14 mmol/L — ABNORMAL LOW (ref 22–32)
Calcium: 8.8 mg/dL — ABNORMAL LOW (ref 8.9–10.3)
Chloride: 112 mmol/L — ABNORMAL HIGH (ref 98–111)
Creatinine, Ser: 1.51 mg/dL — ABNORMAL HIGH (ref 0.61–1.24)
GFR, Estimated: 51 mL/min — ABNORMAL LOW (ref >60)
Glucose, Bld: 119 mg/dL — ABNORMAL HIGH (ref 70–99)
Potassium: 3.9 mmol/L (ref 3.5–5.1)
Sodium: 135 mmol/L (ref 135–145)

## 2023-05-14 LAB — MAGNESIUM: Magnesium: 2 mg/dL (ref 1.7–2.4)

## 2023-05-14 LAB — PHOSPHORUS: Phosphorus: 2.8 mg/dL (ref 2.5–4.6)

## 2023-05-14 MED ORDER — FAT EMUL FISH OIL/PLANT BASED 20% (SMOFLIPID)IV EMUL
250.0000 mL | INTRAVENOUS | Status: AC
  Administered 2023-05-14: 250 mL via INTRAVENOUS
  Filled 2023-05-14: qty 250

## 2023-05-14 MED ORDER — POTASSIUM CHLORIDE CRYS ER 20 MEQ PO TBCR
20.0000 meq | EXTENDED_RELEASE_TABLET | Freq: Once | ORAL | Status: AC
  Administered 2023-05-14: 20 meq via ORAL
  Filled 2023-05-14: qty 1

## 2023-05-14 MED ORDER — TRACE MINERALS CU-MN-SE-ZN 300-55-60-3000 MCG/ML IV SOLN
INTRAVENOUS | Status: AC
  Filled 2023-05-14 (×2): qty 2000

## 2023-05-14 NOTE — Progress Notes (Signed)
 PHARMACY - TOTAL PARENTERAL NUTRITION CONSULT NOTE   Indication: Prolonged ileus  Patient Measurements: Height: 5' 8 (172.7 cm) Weight: 92.9 kg (204 lb 12.9 oz) IBW/kg (Calculated) : 68.4 TPN AdjBW (KG): 74.5 Body mass index is 31.14 kg/m. Usual Weight: 93 kg- patient reports no weight changes in the past 3 months  Assessment: 29 YOM admitted for a planned lap VHR on 04/29/2023. Pt with minimal oral intake 2/2 prolonged ileus. He is positive for flatus but has slowing JP drain OP. He has had a couple bouts of green emesis and no BM in 5 days. Pharmacy was consulted for TPN initiation and management.  Glucose / Insulin : History of T2DM; Hgb A1C 5.6 on 04/26/2023. CBG's <120; SSI d/c 1/3  Electrolytes: Na: 135, K 3.9 (goal >/= 4 with ileus), HCO3 14, Mag: 2.0 s/p 2g on 1/3 (goal >/= 2 with ileus), Phos: 2.8 Renal: BUN 41, Scr 1.51 (baseline appears around 1.3-1.5)  Hepatic: 1/2- Albumin  2.5, LFTs wnl, Alk phos: 46, TG: 92  Intake / Output; MIVF: UOP 0.8 ml/kg/hr, NGT accidentally removed 12/29 holding off on replacing, LBM 12/31 GI Imaging: 12/25 CT Abd- probable small bowel obstruction; small pockets of air in anterior adb may be within collapsed loop of small bowel 05/04/2023. Favor ileus over mechanical bowel obstruction. 12/26 DG ABD- persistent small bowel dilatation noted likely related to postop ileus 12/27 DG Abd - Bowel-gas pattern not significantly improved since the CT on 12/28 CT Abd - persistent but decreased small bowel distention  12/31 MR liver - Persistent dilated loops of small and large bowel with air-fluid levels and surgical changes along the anterior abdominal wall. Growth of liver lesion.  GI Surgeries / Procedures:  12/20 Lap VHR w/ mesh 1/2 - liver lesion biopsy  Central access: 12/26 TPN start date: 12/26  Nutritional Goals: Goal TPN rate is 70 mL/hr  + SMOF lipid at 20.8ml/hr x 12 hours  Provides 134 g of protein and 1608 kcals per day (meeting 111% of  protein needs and 80% of kcal needs)  Due to fluid shortages caused by Lilian Hammersmith, Clinimix  will be utilized to meet his nutritional needs as able.  RD Assessment: Estimated Needs Total Energy Estimated Needs: 2000-2200 kcal/d Total Protein Estimated Needs: 105-120g/d Total Fluid Estimated Needs: 2-2.2 L/d  Current Nutrition:  Soft diet 1/2 >> TPN >> reduced to half on 1/3 Boost TID (none taken on 1/3)  Plan:  Continue reduce Clinimix  8/10 (no lytes) at half rate of 35 mL/hr this evening with SMOF lipid 20.8 ml/hr x12 hours after discussion with MD. Providing 1053kcal and 67g AA meeting approximately 50% of nutritional needs.  Add standard MVI and trace elements to TPN Monitor TPN labs on Mon/Thurs and tmrw AM. Patient requiring frequent electrolyte replacement given utilizing Clinimix  w/o E.  Will give 20mEq KCL.  Follow-up toleration of full liquid diet and ability to wean TPN.  Monitor PO intake - f/u ability to stop TPN on 1/5  Powell Blush, PharmD, BCCCP  05/14/2023 12:12 PM

## 2023-05-14 NOTE — Progress Notes (Signed)
 Mobility Specialist Progress Note:   05/14/23 1413  Therapy Vitals  Temp 98 F (36.7 C)  Temp Source Oral  Pulse Rate 81  Resp 16  BP 133/75  Patient Position (if appropriate) Lying  Oxygen Therapy  SpO2 98 %  O2 Device Room Air  Mobility  Activity Ambulated with assistance in hallway  Level of Assistance Contact guard assist, steadying assist  Assistive Device Other (Comment) (IV Pole)  Distance Ambulated (ft) 400 ft  Activity Response Tolerated well  Mobility Referral Yes  Mobility visit 1 Mobility  Mobility Specialist Start Time (ACUTE ONLY) 1344  Mobility Specialist Stop Time (ACUTE ONLY) 1355  Mobility Specialist Time Calculation (min) (ACUTE ONLY) 11 min   Pt received in bed, c/o fatigue and weakness but agreeable. Able to astand and ambulate w/ CG. No complaints throughout ambulation. Upon returning to bed, pt c/o severe pain in both feet. RN notified. Pt left in bed with call bell and all needs met.  D'Vante Nicholaus Mobility Specialist Please contact via Special Educational Needs Teacher or Rehab office at [phone]

## 2023-05-14 NOTE — Progress Notes (Signed)
 Trauma/Critical Care Follow Up Note  Subjective:    Overnight Issues:   Objective:  Vital signs for last 24 hours: Temp:  [97.7 F (36.5 C)-98.2 F (36.8 C)] 97.8 F (36.6 C) (01/04 0738) Pulse Rate:  [69-74] 72 (01/04 0738) Resp:  [16-18] 16 (01/04 0738) BP: (120-149)/(56-73) 138/66 (01/04 0738) SpO2:  [97 %-100 %] 99 % (01/04 0738)  Hemodynamic parameters for last 24 hours:    Intake/Output from previous day: 01/03 0701 - 01/04 0700 In: 1482.7 [I.V.:1432.7; IV Piggyback:50] Out: 1875 [Urine:1875]  Intake/Output this shift: Total I/O In: -  Out: 250 [Urine:250]  Vent settings for last 24 hours:    Physical Exam:  Gen: comfortable, no distress Neuro: follows commands, alert, communicative HEENT: PERRL Neck: supple CV: RRR Pulm: unlabored breathing on RA Abd: soft, NT, incision clean, dry, intact ,    GU: urine clear and yellow, +spontaneous voids Extr: wwp, no edema  Results for orders placed or performed during the hospital encounter of 04/29/23 (from the past 24 hours)  CBC     Status: Abnormal   Collection Time: 05/14/23  4:09 AM  Result Value Ref Range   WBC 20.1 (H) 4.0 - 10.5 K/uL   RBC 3.80 (L) 4.22 - 5.81 MIL/uL   Hemoglobin 10.2 (L) 13.0 - 17.0 g/dL   HCT 70.9 (L) 60.9 - 47.9 %   MCV 76.3 (L) 80.0 - 100.0 fL   MCH 26.8 26.0 - 34.0 pg   MCHC 35.2 30.0 - 36.0 g/dL   RDW 84.6 88.4 - 84.4 %   Platelets 415 (H) 150 - 400 K/uL   nRBC 0.0 0.0 - 0.2 %  Basic metabolic panel     Status: Abnormal   Collection Time: 05/14/23  4:09 AM  Result Value Ref Range   Sodium 135 135 - 145 mmol/L   Potassium 3.9 3.5 - 5.1 mmol/L   Chloride 112 (H) 98 - 111 mmol/L   CO2 14 (L) 22 - 32 mmol/L   Glucose, Bld 119 (H) 70 - 99 mg/dL   BUN 41 (H) 8 - 23 mg/dL   Creatinine, Ser 8.48 (H) 0.61 - 1.24 mg/dL   Calcium  8.8 (L) 8.9 - 10.3 mg/dL   GFR, Estimated 51 (L) >60 mL/min   Anion gap 9 5 - 15  Magnesium      Status: None   Collection Time: 05/14/23  4:09 AM   Result Value Ref Range   Magnesium  2.0 1.7 - 2.4 mg/dL  Phosphorus     Status: None   Collection Time: 05/14/23  4:09 AM  Result Value Ref Range   Phosphorus 2.8 2.5 - 4.6 mg/dL    Assessment & Plan:  Present on Admission:  Incisional hernia    LOS: 15 days   Additional comments:I reviewed the patient's new clinical lab test results.   and I reviewed the patients new imaging test results.    VHR with biologic mesh -+flatus and BM, incision cdi, JP drain out. Continue mobilization and pain control. Path negative. CT A/P 12/28 without any post-op changes. Remove staples today.  Incidentally identified enlarging hepatic lesion - suspect metastatic disease, CEA 2, noncon CT chest and MRI liver completed.  Plan  IR liver bx 1/2. Palliative consult also placed for GoC/MOST form, discussion 1/3 Ileus vs SBO - limited PO intake but no nausea yest ID - WBC had normalized, downtrending, AF, urine with rare bacteria, no LE/nitr, CT chest not particularly remarkable, bcx 12/31 NGTD  AKI - back to  baseline creatinine, continue to hydrate FEN - soft diet, PICC/TPN, 1/2 TPN today, still having BM and flatus DVT - SCDs, SQH Dispo - med-surg     [name], MD Trauma & General Surgery Please use [email or web address] to contact on call provider  05/14/2023  *Care during the described time interval was provided by me. I have reviewed this patient's available data, including medical history, events of note, physical examination and test results as part of my evaluation.

## 2023-05-15 LAB — CULTURE, BLOOD (ROUTINE X 2)
Culture: NO GROWTH
Culture: NO GROWTH

## 2023-05-15 LAB — BASIC METABOLIC PANEL
Anion gap: 8 (ref 5–15)
BUN: 42 mg/dL — ABNORMAL HIGH (ref 8–23)
CO2: 14 mmol/L — ABNORMAL LOW (ref 22–32)
Calcium: 9 mg/dL (ref 8.9–10.3)
Chloride: 108 mmol/L (ref 98–111)
Creatinine, Ser: 1.68 mg/dL — ABNORMAL HIGH (ref 0.61–1.24)
GFR, Estimated: 45 mL/min — ABNORMAL LOW (ref >60)
Glucose, Bld: 104 mg/dL — ABNORMAL HIGH (ref 70–99)
Potassium: 4.1 mmol/L (ref 3.5–5.1)
Sodium: 130 mmol/L — ABNORMAL LOW (ref 135–145)

## 2023-05-15 LAB — PHOSPHORUS: Phosphorus: 3.5 mg/dL (ref 2.5–4.6)

## 2023-05-15 LAB — CBC
HCT: 28.6 % — ABNORMAL LOW (ref 39.0–52.0)
Hemoglobin: 10 g/dL — ABNORMAL LOW (ref 13.0–17.0)
MCH: 26.7 pg (ref 26.0–34.0)
MCHC: 35 g/dL (ref 30.0–36.0)
MCV: 76.3 fL — ABNORMAL LOW (ref 80.0–100.0)
Platelets: 468 10*3/uL — ABNORMAL HIGH (ref 150–400)
RBC: 3.75 MIL/uL — ABNORMAL LOW (ref 4.22–5.81)
RDW: 15.6 % — ABNORMAL HIGH (ref 11.5–15.5)
WBC: 23.9 10*3/uL — ABNORMAL HIGH (ref 4.0–10.5)
nRBC: 0 % (ref 0.0–0.2)

## 2023-05-15 LAB — MAGNESIUM: Magnesium: 1.8 mg/dL (ref 1.7–2.4)

## 2023-05-15 MED ORDER — MAGNESIUM HYDROXIDE 400 MG/5ML PO SUSP
30.0000 mL | Freq: Once | ORAL | Status: AC
  Administered 2023-05-15: 30 mL via ORAL
  Filled 2023-05-15: qty 30

## 2023-05-15 MED ORDER — FAT EMUL FISH OIL/PLANT BASED 20% (SMOFLIPID)IV EMUL
250.0000 mL | INTRAVENOUS | Status: AC
  Administered 2023-05-15: 250 mL via INTRAVENOUS
  Filled 2023-05-15: qty 250

## 2023-05-15 MED ORDER — SODIUM CHLORIDE 0.9 % IV BOLUS
500.0000 mL | Freq: Once | INTRAVENOUS | Status: AC
  Administered 2023-05-15: 500 mL via INTRAVENOUS

## 2023-05-15 MED ORDER — TRACE MINERALS CU-MN-SE-ZN 300-55-60-3000 MCG/ML IV SOLN
INTRAVENOUS | Status: AC
  Filled 2023-05-15: qty 2000

## 2023-05-15 MED ORDER — MAGNESIUM SULFATE 2 GM/50ML IV SOLN
2.0000 g | Freq: Once | INTRAVENOUS | Status: AC
  Administered 2023-05-15: 2 g via INTRAVENOUS
  Filled 2023-05-15: qty 50

## 2023-05-15 MED ORDER — HYDROMORPHONE HCL 1 MG/ML IJ SOLN: 0.2500 mg | Freq: Three times a day (TID) | INTRAMUSCULAR | Status: DC | PRN

## 2023-05-15 NOTE — Plan of Care (Signed)

## 2023-05-15 NOTE — Progress Notes (Signed)
 Patient reported increased bloating this afternoon, abdomen distended. Patient reports gas, but no bowel movement this shift. Ambulated in room and hallway with this nurse. Milk of Magnesia ordered by Dr. Bedelia Person.

## 2023-05-15 NOTE — Progress Notes (Signed)
 PHARMACY - TOTAL PARENTERAL NUTRITION CONSULT NOTE   Indication: Prolonged ileus  Patient Measurements: Height: 5' 8 (172.7 cm) Weight: 92.9 kg (204 lb 12.9 oz) IBW/kg (Calculated) : 68.4 TPN AdjBW (KG): 74.5 Body mass index is 31.14 kg/m. Usual Weight: 93 kg- patient reports no weight changes in the past 3 months  Assessment: 42 YOM admitted for a planned lap VHR on 04/29/2023. Pt with minimal oral intake 2/2 prolonged ileus. He is positive for flatus but has slowing JP drain OP. He has had a couple bouts of green emesis and no BM in 5 days. Pharmacy was consulted for TPN initiation and management.  Glucose / Insulin : History of T2DM; Hgb A1C 5.6 on 04/26/2023. CBG's <120; SSI d/c 1/3  Electrolytes: Na: 130, K 4.1 (s/p , goal >/= 4 with ileus), HCO3 14, Mag: 1.8 (goal >/= 2 with ileus), Phos: 3.5 Renal: BUN 42, Scr 1.68 (baseline appears around 1.3-1.5)  Hepatic: 1/2- Albumin  2.5, LFTs wnl, Alk phos: 46, TG: 92  Intake / Output; MIVF: UOP 0.6 ml/kg/hr, NGT accidentally removed 12/29 holding off on replacing, LBM 1/3 GI Imaging: 12/25 CT Abd- probable small bowel obstruction; small pockets of air in anterior adb may be within collapsed loop of small bowel 05/04/2023. Favor ileus over mechanical bowel obstruction. 12/26 DG ABD- persistent small bowel dilatation noted likely related to postop ileus 12/27 DG Abd - Bowel-gas pattern not significantly improved since the CT on 12/28 CT Abd - persistent but decreased small bowel distention  12/31 MR liver - Persistent dilated loops of small and large bowel with air-fluid levels and surgical changes along the anterior abdominal wall. Growth of liver lesion.  GI Surgeries / Procedures:  12/20 Lap VHR w/ mesh 1/2 - liver lesion biopsy  Central access: 12/26 TPN start date: 12/26  Nutritional Goals: Goal TPN rate is 70 mL/hr  + SMOF lipid at 20.8ml/hr x 12 hours  Provides 134 g of protein and 1608 kcals per day (meeting 111% of  protein needs and 80% of kcal needs)  Due to fluid shortages caused by Lilian Hammersmith, Clinimix  will be utilized to meet his nutritional needs as able.  RD Assessment: Estimated Needs Total Energy Estimated Needs: 2000-2200 kcal/d Total Protein Estimated Needs: 105-120g/d Total Fluid Estimated Needs: 2-2.2 L/d  Current Nutrition:  Soft diet 1/2 >> TPN >> reduced to half on 1/3 Boost TID (none taken on 1/4)  Plan:  Continue reduce Clinimix  8/10 (no lytes) at half rate of 35 mL/hr this evening with SMOF lipid 20.8 ml/hr x12 hours. Providing 1053kcal and 67g AA meeting approximately 50% of nutritional needs.  Add standard MVI and trace elements to TPN Monitor TPN labs on Mon/Thurs and tmrw AM. Patient requiring frequent electrolyte replacement given utilizing Clinimix  w/o E.  Will give 2g mag sulfate.  Follow-up toleration of full liquid diet and ability to wean TPN.  Monitor PO intake - f/u ability to stop TPN on 1/6, MD to continue for another 24 hours.   Powell Blush, PharmD, BCCCP  05/15/2023 7:41 AM

## 2023-05-15 NOTE — Progress Notes (Signed)
 Trauma/Critical Care Follow Up Note  Subjective:    Overnight Issues:   Objective:  Vital signs for last 24 hours: Temp:  [97.6 F (36.4 C)-98.4 F (36.9 C)] 98.4 F (36.9 C) (01/05 1530) Pulse Rate:  [73-85] 85 (01/05 1530) Resp:  [16-18] 18 (01/05 1530) BP: (118-155)/(56-68) 155/68 (01/05 1530) SpO2:  [98 %] 98 % (01/05 0936)  Hemodynamic parameters for last 24 hours:    Intake/Output from previous day: 01/04 0701 - 01/05 0700 In: 867.1 [I.V.:867.1] Out: 1400 [Urine:1400]  Intake/Output this shift: Total I/O In: 1249.1 [P.O.:510; I.V.:739.1] Out: 350 [Urine:350]  Vent settings for last 24 hours:    Physical Exam:  Gen: comfortable, no distress Neuro: follows commands, alert, communicative HEENT: PERRL Neck: supple CV: RRR Pulm: unlabored breathing on RA Abd: soft, NT, incision clean, dry, intact ,  no BM yest/today GU: urine clear and yellow, +spontaneous voids Extr: wwp, no edema  Results for orders placed or performed during the hospital encounter of 04/29/23 (from the past 24 hours)  CBC     Status: Abnormal   Collection Time: 05/15/23  2:49 AM  Result Value Ref Range   WBC 23.9 (H) 4.0 - 10.5 K/uL   RBC 3.75 (L) 4.22 - 5.81 MIL/uL   Hemoglobin 10.0 (L) 13.0 - 17.0 g/dL   HCT 71.3 (L) 60.9 - 47.9 %   MCV 76.3 (L) 80.0 - 100.0 fL   MCH 26.7 26.0 - 34.0 pg   MCHC 35.0 30.0 - 36.0 g/dL   RDW 84.3 (H) 88.4 - 84.4 %   Platelets 468 (H) 150 - 400 K/uL   nRBC 0.0 0.0 - 0.2 %  Basic metabolic panel     Status: Abnormal   Collection Time: 05/15/23  2:49 AM  Result Value Ref Range   Sodium 130 (L) 135 - 145 mmol/L   Potassium 4.1 3.5 - 5.1 mmol/L   Chloride 108 98 - 111 mmol/L   CO2 14 (L) 22 - 32 mmol/L   Glucose, Bld 104 (H) 70 - 99 mg/dL   BUN 42 (H) 8 - 23 mg/dL   Creatinine, Ser 8.31 (H) 0.61 - 1.24 mg/dL   Calcium  9.0 8.9 - 10.3 mg/dL   GFR, Estimated 45 (L) >60 mL/min   Anion gap 8 5 - 15  Magnesium      Status: None   Collection Time:  05/15/23  2:49 AM  Result Value Ref Range   Magnesium  1.8 1.7 - 2.4 mg/dL  Phosphorus     Status: None   Collection Time: 05/15/23  2:49 AM  Result Value Ref Range   Phosphorus 3.5 2.5 - 4.6 mg/dL    Assessment & Plan:  Present on Admission:  Incisional hernia    LOS: 16 days   Additional comments:I reviewed the patient's new clinical lab test results.   and I reviewed the patients new imaging test results.    VHR with biologic mesh -+flatus and BM, incision cdi, JP drain out. Continue mobilization and pain control. Path negative. CT A/P 12/28 without any post-op changes. Removed staples. Incidentally identified enlarging hepatic lesion - suspect metastatic disease, CEA 2, noncon CT chest and MRI liver completed.  Plan  IR liver bx 1/2. Palliative consult also placed for GoC/MOST form, discussion 1/3 Ileus vs SBO - limited PO intake but no nausea yest ID - WBC had normalized, back up today, AF, urine with rare bacteria, no LE/nitr, CT chest not particularly remarkable, bcx 12/31 negative AKI - back to baseline creatinine,  continue to hydrate FEN - soft diet, PICC/TPN, 1/2 TPN today, add MoM for BM today DVT - SCDs, SQH Dispo - med-surg   Thomas GEANNIE Hanger, MD Trauma & General Surgery Please use [email or web address] to contact on call provider  05/15/2023  *Care during the described time interval was provided by me. I have reviewed this patient's available data, including medical history, events of note, physical examination and test results as part of my evaluation.

## 2023-05-16 LAB — CBC
HCT: 27.8 % — ABNORMAL LOW (ref 39.0–52.0)
Hemoglobin: 10 g/dL — ABNORMAL LOW (ref 13.0–17.0)
MCH: 27.2 pg (ref 26.0–34.0)
MCHC: 36 g/dL (ref 30.0–36.0)
MCV: 75.7 fL — ABNORMAL LOW (ref 80.0–100.0)
Platelets: 510 10*3/uL — ABNORMAL HIGH (ref 150–400)
RBC: 3.67 MIL/uL — ABNORMAL LOW (ref 4.22–5.81)
RDW: 15.7 % — ABNORMAL HIGH (ref 11.5–15.5)
WBC: 21.8 10*3/uL — ABNORMAL HIGH (ref 4.0–10.5)
nRBC: 0 % (ref 0.0–0.2)

## 2023-05-16 LAB — COMPREHENSIVE METABOLIC PANEL
ALT: 23 U/L (ref 0–44)
AST: 13 U/L — ABNORMAL LOW (ref 15–41)
Albumin: 2.9 g/dL — ABNORMAL LOW (ref 3.5–5.0)
Alkaline Phosphatase: 61 U/L (ref 38–126)
Anion gap: 7 (ref 5–15)
BUN: 46 mg/dL — ABNORMAL HIGH (ref 8–23)
CO2: 15 mmol/L — ABNORMAL LOW (ref 22–32)
Calcium: 9.1 mg/dL (ref 8.9–10.3)
Chloride: 109 mmol/L (ref 98–111)
Creatinine, Ser: 1.69 mg/dL — ABNORMAL HIGH (ref 0.61–1.24)
GFR, Estimated: 44 mL/min — ABNORMAL LOW (ref >60)
Glucose, Bld: 110 mg/dL — ABNORMAL HIGH (ref 70–99)
Potassium: 4.3 mmol/L (ref 3.5–5.1)
Sodium: 131 mmol/L — ABNORMAL LOW (ref 135–145)
Total Bilirubin: 0.8 mg/dL (ref 0.0–1.2)
Total Protein: 6.9 g/dL (ref 6.5–8.1)

## 2023-05-16 LAB — MAGNESIUM: Magnesium: 2.2 mg/dL (ref 1.7–2.4)

## 2023-05-16 LAB — PHOSPHORUS: Phosphorus: 3.3 mg/dL (ref 2.5–4.6)

## 2023-05-16 LAB — SURGICAL PATHOLOGY

## 2023-05-16 LAB — TRIGLYCERIDES: Triglycerides: 79 mg/dL (ref <150)

## 2023-05-16 MED ORDER — SENNA 8.6 MG PO TABS
2.0000 | ORAL_TABLET | Freq: Once | ORAL | Status: AC
  Administered 2023-05-16: 17.2 mg via ORAL
  Filled 2023-05-16: qty 2

## 2023-05-16 MED ORDER — SODIUM CHLORIDE 0.9 % IV BOLUS
1000.0000 mL | Freq: Once | INTRAVENOUS | Status: AC
  Administered 2023-05-16: 1000 mL via INTRAVENOUS

## 2023-05-16 MED ORDER — METOCLOPRAMIDE HCL 5 MG/5ML PO SOLN: 10.0000 mg | Freq: Four times a day (QID) | ORAL | Status: DC

## 2023-05-16 MED ORDER — MAGNESIUM HYDROXIDE 400 MG/5ML PO SUSP
30.0000 mL | Freq: Once | ORAL | Status: AC
  Administered 2023-05-16: 30 mL via ORAL
  Filled 2023-05-16: qty 30

## 2023-05-16 MED ORDER — FAT EMUL FISH OIL/PLANT BASED 20% (SMOFLIPID)IV EMUL
250.0000 mL | INTRAVENOUS | Status: AC
  Administered 2023-05-16: 250 mL via INTRAVENOUS
  Filled 2023-05-16: qty 250

## 2023-05-16 MED ORDER — MAGNESIUM CITRATE PO SOLN
1.0000 | Freq: Once | ORAL | Status: DC
  Filled 2023-05-16: qty 296

## 2023-05-16 MED ORDER — BISACODYL 5 MG PO TBEC
10.0000 mg | DELAYED_RELEASE_TABLET | Freq: Once | ORAL | Status: AC
  Administered 2023-05-16: 10 mg via ORAL
  Filled 2023-05-16: qty 2

## 2023-05-16 MED ORDER — BISACODYL 10 MG RE SUPP: 10.0000 mg | Freq: Once | RECTAL | Status: DC

## 2023-05-16 MED ORDER — METOCLOPRAMIDE HCL 5 MG/5ML PO SOLN
10.0000 mg | Freq: Four times a day (QID) | ORAL | Status: DC
  Administered 2023-05-16 – 2023-05-18 (×9): 10 mg via ORAL
  Filled 2023-05-16 (×10): qty 10

## 2023-05-16 MED ORDER — TRACE MINERALS CU-MN-SE-ZN 300-55-60-3000 MCG/ML IV SOLN
INTRAVENOUS | Status: AC
  Filled 2023-05-16 (×2): qty 2000

## 2023-05-16 NOTE — Progress Notes (Signed)
 Calorie Count Note  48 hour calorie count ordered.  Diet: Soft Supplements: Ensure Enlive TID  Estimated Nutritional Needs:    Kcal:  2000-2200 kcal/d   Protein:  105-120g/d   Fluid:  2-2.2 L/d  01/04 Breakfast: 56 calories and 8 gm protein Lunch: skipped meal Dinner: 143 calories and 5 gm protein Supplements: Ensure Enlive x1 350 calories and 20 gm protein  Total intake: 549 calories  kcal (27% of minimum estimated needs)  33 gm protein (31% of minimum estimated needs)  01/05 Breakfast: 70 calories and 2.5 gm protein Lunch: 291 calories and 21 gm protein Dinner: skipped Supplements: Ensure Enlive x1 350 calories and 20 gm protein  Total intake: 711 kcal (36% of minimum estimated needs)  43.5 gm protein (41% of minimum estimated needs)  Nutrition Dx: Inadequate oral intake related to vomiting as evidenced by NPO status.   -addressing with TPN and diet advancement  Goal: Patient will meet greater than or equal to 90% of their needs    Intervention:   -Continue soft diet  -Continue Ensure Enlive po TID, each supplement provides 350 kcal and 20 grams of protein.  -Recommend resume TPN  Clinimix  8/10 at 70 ml/hr + SMOF lipids (20.8ml/hr x 12 hrs) per Pharmacy management. Continue until patient demonstrates tolerance of meeting at least 65% EER, then would wean PN.   Elveria Sable, RDLD Clinical Dietitian If unable to reach, please contact RD Inpatient secure chat group between 8 am-4 pm daily

## 2023-05-16 NOTE — Progress Notes (Addendum)
 PHARMACY - TOTAL PARENTERAL NUTRITION CONSULT NOTE   Indication: Prolonged ileus  Patient Measurements: Height: 5' 8 (172.7 cm) Weight: 92.9 kg (204 lb 12.9 oz) IBW/kg (Calculated) : 68.4 TPN AdjBW (KG): 74.5 Body mass index is 31.14 kg/m. Usual Weight: 93 kg- patient reports no weight changes in the past 3 months  Assessment: 57 YOM admitted for a planned lap VHR on 04/29/2023. Pt with minimal oral intake 2/2 prolonged ileus. He is positive for flatus but has slowing JP drain OP. He has had a couple bouts of green emesis and no BM in 5 days. Pharmacy was consulted for TPN initiation and management.  Glucose / Insulin : History of T2DM; Hgb A1C 5.6 on 04/26/2023. CBG's <120; SSI d/c 1/3  Electrolytes: Na: 131, K 4.3 (goal >/= 4 with ileus), HCO3 15, CoCa 10.58, Mag: 2.2 (goal >/= 2 with ileus), Phos: 3.3 Renal: BUN 46, Scr 1.69 (baseline appears around 1.3-1.5)  Hepatic: 1/2- Albumin  2.9, AST 13 / ALTwnl, Alk phos: 61, TG: 79  Intake / Output; MIVF: UOP 0.56 ml/kg/hr, NGT accidentally removed 12/29 holding off on replacing, LBM 1/4 GI Imaging: 12/25 CT Abd- probable small bowel obstruction; small pockets of air in anterior adb may be within collapsed loop of small bowel 05/04/2023. Favor ileus over mechanical bowel obstruction. 12/26 DG ABD- persistent small bowel dilatation noted likely related to postop ileus 12/27 DG Abd - Bowel-gas pattern not significantly improved since the CT on 12/28 CT Abd - persistent but decreased small bowel distention  12/31 MR liver - Persistent dilated loops of small and large bowel with air-fluid levels and surgical changes along the anterior abdominal wall. Growth of liver lesion.  GI Surgeries / Procedures:  12/20 Lap VHR w/ mesh 1/2 - liver lesion biopsy  Central access: 12/26 TPN start date: 12/26  Nutritional Goals: Goal TPN rate is 70 mL/hr  + SMOF lipid at 20.8ml/hr x 12 hours  Provides 134 g of protein and 1608 kcals per day (meeting 111%  of protein needs and 80% of kcal needs)  Due to fluid shortages caused by Lilian Hammersmith, Clinimix  will be utilized to meet his nutritional needs as able.  RD Assessment: Estimated Needs Total Energy Estimated Needs: 2000-2200 kcal/d Total Protein Estimated Needs: 105-120g/d Total Fluid Estimated Needs: 2-2.2 L/d  Current Nutrition:  Soft diet 1/2 >> TPN >> reduced to half on 1/3 Boost TID (none taken on 1/4)  Results of calorie count (1/6):  1/4 met 27% EER and 31% est protein; on 1/5 , met 36% EER and 41% est protein  This is suboptimal  Plan:  Based on the results of his calorie count, we will: Continue reduce Clinimix  8/10 (no lytes) at half rate of 35 mL/hr this evening with SMOF lipid 20.8 ml/hr x12 hours. Providing 1053kcal and 67g AA meeting approximately 50% of nutritional needs.  Add standard MVI and trace elements to TPN Monitor TPN labs on Mon/Thurs. Patient requiring frequent electrolyte replacement given utilizing Clinimix  w/o E.  Follow-up toleration of full liquid diet and ability to discontinue TPN.  Monitor PO intake  Korena Nass BS, PharmD, BCPS Clinical Pharmacist 05/16/2023 12:39 PM  Contact: [phone] after 3 PM  Be curious, not judgmental... -Davina Sprinkles

## 2023-05-16 NOTE — TOC Progression Note (Signed)
 Transition of Care [facility]) - Progression Note    Patient Details  Name: Thomas [name] MRN: [id number] Date of Birth: 05-02-1956  [facility] of [facility] CM/SW Contact  Rosalva Jon Bloch, RN Phone Number: 05/16/2023, 10:22 AM  Clinical Narrative:    Pt will transition to home once d/c ready. Still receiving TPN. Diet advanced : soft regular. Supportive family. TOC team will continue to monitor and assist with needs...  Expected Discharge Plan: Home/Self Care Barriers to Discharge: Continued Medical Work up  Expected Discharge Plan and Services   Discharge Planning Services: CM Consult   Living arrangements for the past 2 months: Single Family Home                                       Social Determinants of Health (SDOH) Interventions SDOH Screenings   Food Insecurity: No Food Insecurity (04/29/2023)  Housing: Low Risk  (04/29/2023)  Transportation Needs: No Transportation Needs (04/29/2023)  Utilities: Not At Risk (04/29/2023)  Depression (PHQ2-9): Low Risk  (04/28/2021)  Social Connections: Unknown (05/10/2023)  Tobacco Use: Medium Risk (05/12/2023)    Readmission Risk Interventions    07/07/2022    5:56 PM 02/16/2021   10:08 AM  Readmission Risk Prevention Plan  Transportation Screening Complete Complete  PCP or Specialist Appt within 3-5 Days Complete   HRI or Home Care Consult Complete   Social Work Consult for Recovery Care Planning/Counseling Complete   Palliative Care Screening Not Applicable   Medication Review Oceanographer) Complete Complete  PCP or Specialist appointment within 3-5 days of discharge  Complete  HRI or Home Care Consult  Complete  SW Recovery Care/Counseling Consult  Complete  Palliative Care Screening  Complete  Skilled Nursing Facility  Complete

## 2023-05-16 NOTE — Progress Notes (Signed)
 Trauma/Critical Care Follow Up Note  Subjective:    Overnight Issues:   Objective:  Vital signs for last 24 hours: Temp:  [97.7 F (36.5 C)-98 F (36.7 C)] 97.7 F (36.5 C) (01/06 1721) Pulse Rate:  [71-81] 81 (01/06 1721) Resp:  [14-18] 14 (01/06 1721) BP: (123-140)/(56-72) 138/62 (01/06 1721) SpO2:  [97 %-99 %] 99 % (01/06 1721)  Hemodynamic parameters for last 24 hours:    Intake/Output from previous day: 01/05 0701 - 01/06 0700 In: 1348.6 [P.O.:510; I.V.:838.6] Out: 1250 [Urine:1250]  Intake/Output this shift: No intake/output data recorded.  Vent settings for last 24 hours:    Physical Exam:  Gen: comfortable, no distress Neuro: follows commands, alert, communicative HEENT: PERRL Neck: supple CV: RRR Pulm: unlabored breathing on RA Abd: soft, NT, incision clean, dry, intact ,    GU: urine clear and yellow, +spontaneous voids Extr: wwp, no edema  Results for orders placed or performed during the hospital encounter of 04/29/23 (from the past 24 hours)  Comprehensive metabolic panel     Status: Abnormal   Collection Time: 05/16/23  3:37 AM  Result Value Ref Range   Sodium 131 (L) 135 - 145 mmol/L   Potassium 4.3 3.5 - 5.1 mmol/L   Chloride 109 98 - 111 mmol/L   CO2 15 (L) 22 - 32 mmol/L   Glucose, Bld 110 (H) 70 - 99 mg/dL   BUN 46 (H) 8 - 23 mg/dL   Creatinine, Ser 8.30 (H) 0.61 - 1.24 mg/dL   Calcium  9.1 8.9 - 10.3 mg/dL   Total Protein 6.9 6.5 - 8.1 g/dL   Albumin  2.9 (L) 3.5 - 5.0 g/dL   AST 13 (L) 15 - 41 U/L   ALT 23 0 - 44 U/L   Alkaline Phosphatase 61 38 - 126 U/L   Total Bilirubin 0.8 0.0 - 1.2 mg/dL   GFR, Estimated 44 (L) >60 mL/min   Anion gap 7 5 - 15  Magnesium      Status: None   Collection Time: 05/16/23  3:37 AM  Result Value Ref Range   Magnesium  2.2 1.7 - 2.4 mg/dL  Phosphorus     Status: None   Collection Time: 05/16/23  3:37 AM  Result Value Ref Range   Phosphorus 3.3 2.5 - 4.6 mg/dL  Triglycerides     Status: None    Collection Time: 05/16/23  3:37 AM  Result Value Ref Range   Triglycerides 79 <150 mg/dL  CBC     Status: Abnormal   Collection Time: 05/16/23  3:37 AM  Result Value Ref Range   WBC 21.8 (H) 4.0 - 10.5 K/uL   RBC 3.67 (L) 4.22 - 5.81 MIL/uL   Hemoglobin 10.0 (L) 13.0 - 17.0 g/dL   HCT 72.1 (L) 60.9 - 47.9 %   MCV 75.7 (L) 80.0 - 100.0 fL   MCH 27.2 26.0 - 34.0 pg   MCHC 36.0 30.0 - 36.0 g/dL   RDW 84.2 (H) 88.4 - 84.4 %   Platelets 510 (H) 150 - 400 K/uL   nRBC 0.0 0.0 - 0.2 %    Assessment & Plan:  Present on Admission:  Incisional hernia    LOS: 17 days   Additional comments:I reviewed the patient's new clinical lab test results.   and I reviewed the patients new imaging test results.    VHR with biologic mesh -+flatus and BM, incision cdi, JP drain out. Continue mobilization and pain control. Path negative. CT A/P 12/28 without any post-op changes.  Removed staples. Incidentally identified enlarging hepatic lesion - suspect metastatic disease, CEA 2, noncon CT chest and MRI liver completed.  Plan  IR liver bx 1/2. Palliative consult also placed for GoC/MOST form, discussion 1/3. Path negative for malignancy, but question sampling error. Path results shared with patient, including question of sampling error.  Ileus vs SBO - limited PO intake but no nausea yest ID - WBC had normalized, back up today, AF, urine with rare bacteria, no LE/nitr, CT chest not particularly remarkable, bcx 12/31 negative AKI - back to baseline creatinine, continue to hydrate FEN - soft diet, PICC/TPN, 1/2 TPN today, escalate bowel regimen, add reglan  DVT - SCDs, SQH Dispo - med-surg   Thomas GEANNIE Hanger, MD Trauma & General Surgery Please use [email or web address] to contact on call provider  05/16/2023  *Care during the described time interval was provided by me. I have reviewed this patient's available data, including medical history, events of note, physical examination and test results as part of my  evaluation.

## 2023-05-16 NOTE — Progress Notes (Signed)
 This chaplain responded to PMT NP-Shae consult for spiritual care. The Pt. daughter is at the Pt. bedside at the time of the visit.  The chaplain introduced herself and listened reflectively to the many ways the Pt. faith partners with his healthcare and role in his family. The chaplain understands through the Pt. faith there is a calming peace.  The Pt. began talking about his hopes for discharge. The Pt. recognizes God's timing is coordinating with the medical team in the determining the appropriate time for discharge.  The Pt. accepted the chaplain's intercessory prayer along with F/U spiritual care as needed.  Chaplain Leeroy Hummer [phone]

## 2023-05-17 ENCOUNTER — Encounter: Payer: Self-pay | Admitting: *Deleted

## 2023-05-17 LAB — CBC
HCT: 27.9 % — ABNORMAL LOW (ref 39.0–52.0)
Hemoglobin: 10.1 g/dL — ABNORMAL LOW (ref 13.0–17.0)
MCH: 27.2 pg (ref 26.0–34.0)
MCHC: 36.2 g/dL — ABNORMAL HIGH (ref 30.0–36.0)
MCV: 75 fL — ABNORMAL LOW (ref 80.0–100.0)
Platelets: 548 10*3/uL — ABNORMAL HIGH (ref 150–400)
RBC: 3.72 MIL/uL — ABNORMAL LOW (ref 4.22–5.81)
RDW: 15.9 % — ABNORMAL HIGH (ref 11.5–15.5)
WBC: 21 10*3/uL — ABNORMAL HIGH (ref 4.0–10.5)
nRBC: 0 % (ref 0.0–0.2)

## 2023-05-17 LAB — BASIC METABOLIC PANEL
Anion gap: 8 (ref 5–15)
BUN: 45 mg/dL — ABNORMAL HIGH (ref 8–23)
CO2: 16 mmol/L — ABNORMAL LOW (ref 22–32)
Calcium: 9.2 mg/dL (ref 8.9–10.3)
Chloride: 110 mmol/L (ref 98–111)
Creatinine, Ser: 1.66 mg/dL — ABNORMAL HIGH (ref 0.61–1.24)
GFR, Estimated: 45 mL/min — ABNORMAL LOW (ref >60)
Glucose, Bld: 117 mg/dL — ABNORMAL HIGH (ref 70–99)
Potassium: 4.5 mmol/L (ref 3.5–5.1)
Sodium: 134 mmol/L — ABNORMAL LOW (ref 135–145)

## 2023-05-17 MED ORDER — FAT EMUL FISH OIL/PLANT BASED 20% (SMOFLIPID)IV EMUL: 250.0000 mL | INTRAVENOUS | Status: DC

## 2023-05-17 MED ORDER — TRACE MINERALS CU-MN-SE-ZN 300-55-60-3000 MCG/ML IV SOLN: INTRAVENOUS | Status: DC

## 2023-05-17 NOTE — Progress Notes (Signed)
 Trauma/Critical Care Follow Up Note  Subjective:    Overnight Issues:   Objective:  Vital signs for last 24 hours: Temp:  [97.7 F (36.5 C)-98 F (36.7 C)] 97.8 F (36.6 C) (01/07 0338) Pulse Rate:  [80-81] 81 (01/07 0338) Resp:  [14-18] 16 (01/07 0338) BP: (138-145)/(62-72) 145/72 (01/07 0338) SpO2:  [97 %-99 %] 98 % (01/07 0338)  Hemodynamic parameters for last 24 hours:    Intake/Output from previous day: 01/06 0701 - 01/07 0700 In: 1559.8 [I.V.:559.8] Out: 960 [Urine:960]  Intake/Output this shift: No intake/output data recorded.  Vent settings for last 24 hours:    Physical Exam:  Gen: comfortable, no distress Neuro: follows commands, alert, communicative HEENT: PERRL Neck: supple CV: RRR Pulm: unlabored breathing on RA Abd: soft, NT, incision clean, dry, intact ,    GU: urine clear and yellow, +spontaneous voids Extr: wwp, no edema  Results for orders placed or performed during the hospital encounter of 04/29/23 (from the past 24 hours)  CBC     Status: Abnormal   Collection Time: 05/17/23  5:00 AM  Result Value Ref Range   WBC 21.0 (H) 4.0 - 10.5 K/uL   RBC 3.72 (L) 4.22 - 5.81 MIL/uL   Hemoglobin 10.1 (L) 13.0 - 17.0 g/dL   HCT 72.0 (L) 60.9 - 47.9 %   MCV 75.0 (L) 80.0 - 100.0 fL   MCH 27.2 26.0 - 34.0 pg   MCHC 36.2 (H) 30.0 - 36.0 g/dL   RDW 84.0 (H) 88.4 - 84.4 %   Platelets 548 (H) 150 - 400 K/uL   nRBC 0.0 0.0 - 0.2 %  Basic metabolic panel     Status: Abnormal   Collection Time: 05/17/23  5:00 AM  Result Value Ref Range   Sodium 134 (L) 135 - 145 mmol/L   Potassium 4.5 3.5 - 5.1 mmol/L   Chloride 110 98 - 111 mmol/L   CO2 16 (L) 22 - 32 mmol/L   Glucose, Bld 117 (H) 70 - 99 mg/dL   BUN 45 (H) 8 - 23 mg/dL   Creatinine, Ser 8.33 (H) 0.61 - 1.24 mg/dL   Calcium  9.2 8.9 - 10.3 mg/dL   GFR, Estimated 45 (L) >60 mL/min   Anion gap 8 5 - 15    Assessment & Plan:  Present on Admission:  Incisional hernia    LOS: 18 days    Additional comments:I reviewed the patient's new clinical lab test results.   and I reviewed the patients new imaging test results.    VHR with biologic mesh -+flatus and BM, incision cdi, JP drain out. Continue mobilization and pain control. Path negative. CT A/P 12/28 without any post-op changes. Removed staples. Incidentally identified enlarging hepatic lesion - suspect metastatic disease, CEA 2, noncon CT chest and MRI liver completed.  Plan  IR liver bx 1/2. Palliative consult also placed for GoC/MOST form, discussion 1/3. Path negative for malignancy, but question sampling error. Path results shared with patient, including question of sampling error.  Ileus vs SBO - limited PO intake but no nausea yest ID - WBC had normalized, back up today, AF, urine with rare bacteria, no LE/nitr, CT chest not particularly remarkable, bcx 12/31 negative AKI - back to baseline creatinine, continue to hydrate FEN - reg diet, PICC/TPN, d/c TPN today DVT - SCDs, SQH Dispo - med-surg     Thomas GEANNIE Hanger, MD Trauma & General Surgery Please use [email or web address] to contact on call provider  05/17/2023  *Care during  the described time interval was provided by me. I have reviewed this patient's available data, including medical history, events of note, physical examination and test results as part of my evaluation.

## 2023-05-17 NOTE — Plan of Care (Signed)
   Problem: Activity: Goal: Risk for activity intolerance will decrease Outcome: Progressing   Problem: Nutrition: Goal: Adequate nutrition will be maintained Outcome: Progressing   Problem: Coping: Goal: Level of anxiety will decrease Outcome: Progressing

## 2023-05-17 NOTE — Plan of Care (Signed)
  Problem: Education: Goal: Knowledge of General Education information will improve Description: Including pain rating scale, medication(s)/side effects and non-pharmacologic comfort measures 05/17/2023 1900 by Patrcia Artist HERO, RN Outcome: Progressing 05/17/2023 1900 by Patrcia Artist HERO, RN Outcome: Progressing   Problem: Health Behavior/Discharge Planning: Goal: Ability to manage health-related needs will improve 05/17/2023 1900 by Patrcia Artist HERO, RN Outcome: Progressing 05/17/2023 1900 by Patrcia Artist HERO, RN Outcome: Progressing   Problem: Clinical Measurements: Goal: Ability to maintain clinical measurements within normal limits will improve 05/17/2023 1900 by Patrcia Artist HERO, RN Outcome: Progressing 05/17/2023 1900 by Patrcia Artist HERO, RN Outcome: Progressing Goal: Will remain free from infection 05/17/2023 1900 by Patrcia Artist HERO, RN Outcome: Progressing 05/17/2023 1900 by Patrcia Artist HERO, RN Outcome: Progressing Goal: Diagnostic test results will improve 05/17/2023 1900 by Patrcia Artist HERO, RN Outcome: Progressing 05/17/2023 1900 by Patrcia Artist HERO, RN Outcome: Progressing Goal: Respiratory complications will improve 05/17/2023 1900 by Patrcia Artist HERO, RN Outcome: Progressing 05/17/2023 1900 by Patrcia Artist HERO, RN Outcome: Progressing Goal: Cardiovascular complication will be avoided 05/17/2023 1900 by Patrcia Artist HERO, RN Outcome: Progressing 05/17/2023 1900 by Patrcia Artist HERO, RN Outcome: Progressing   Problem: Activity: Goal: Risk for activity intolerance will decrease 05/17/2023 1900 by Patrcia Artist HERO, RN Outcome: Progressing 05/17/2023 1900 by Patrcia Artist HERO, RN Outcome: Progressing   Problem: Nutrition: Goal: Adequate nutrition will be maintained 05/17/2023 1900 by Patrcia Artist HERO, RN Outcome: Progressing 05/17/2023 1900 by Patrcia Artist HERO, RN Outcome: Progressing   Problem: Coping: Goal: Level of anxiety will decrease 05/17/2023 1900 by Patrcia Artist HERO, RN Outcome:  Progressing 05/17/2023 1900 by Patrcia Artist HERO, RN Outcome: Progressing   Problem: Elimination: Goal: Will not experience complications related to bowel motility 05/17/2023 1900 by Patrcia Artist HERO, RN Outcome: Progressing 05/17/2023 1900 by Patrcia Artist HERO, RN Outcome: Progressing Goal: Will not experience complications related to urinary retention 05/17/2023 1900 by Patrcia Artist HERO, RN Outcome: Progressing 05/17/2023 1900 by Patrcia Artist HERO, RN Outcome: Progressing   Problem: Pain Management: Goal: General experience of comfort will improve 05/17/2023 1900 by Patrcia Artist HERO, RN Outcome: Progressing 05/17/2023 1900 by Patrcia Artist HERO, RN Outcome: Progressing   Problem: Safety: Goal: Ability to remain free from injury will improve 05/17/2023 1900 by Patrcia Artist HERO, RN Outcome: Progressing 05/17/2023 1900 by Patrcia Artist HERO, RN Outcome: Progressing   Problem: Skin Integrity: Goal: Risk for impaired skin integrity will decrease 05/17/2023 1900 by Patrcia Artist HERO, RN Outcome: Progressing 05/17/2023 1900 by Patrcia Artist HERO, RN Outcome: Progressing

## 2023-05-17 NOTE — Progress Notes (Signed)
 Per Dr. Truett Perna: Move 05/25/23 F/U out 1-2 months. Scheduling message sent.

## 2023-05-17 NOTE — Progress Notes (Addendum)
 PHARMACY - TOTAL PARENTERAL NUTRITION CONSULT NOTE   Indication: Prolonged ileus  Patient Measurements: Height: 5' 8 (172.7 cm) Weight: 92.9 kg (204 lb 12.9 oz) IBW/kg (Calculated) : 68.4 TPN AdjBW (KG): 74.5 Body mass index is 31.14 kg/m. Usual Weight: 93 kg- patient reports no weight changes in the past 3 months  Assessment: 52 YOM admitted for a planned lap VHR on 04/29/2023. Pt with minimal oral intake 2/2 prolonged ileus. He is positive for flatus but has slowing JP drain OP. He has had a couple bouts of green emesis and no BM in 5 days. Pharmacy was consulted for TPN initiation and management.  Glucose / Insulin : History of T2DM; Hgb A1C 5.6 on 04/26/2023. CBG's <120; SSI d/c 1/3  Electrolytes: Na: 134, K 4.5 (goal >/= 4 with ileus), HCO3 16, CoCa 10.08, Mag: 2.2 (goal >/= 2 with ileus), Phos: 3.3 Renal: BUN 45, Scr 1.66 (baseline appears around 1.3-1.5)  Hepatic: 1/2- Albumin  2.9, AST 13 / ALTwnl, Alk phos: 61, TG: 79  Intake / Output; MIVF: UOP 0.43 ml/kg/hr, NGT accidentally removed 12/29 holding off on replacing, LBM 1/4 GI Imaging: 12/25 CT Abd- probable small bowel obstruction; small pockets of air in anterior adb may be within collapsed loop of small bowel 05/04/2023. Favor ileus over mechanical bowel obstruction. 12/26 DG ABD- persistent small bowel dilatation noted likely related to postop ileus 12/27 DG Abd - Bowel-gas pattern not significantly improved since the CT on 12/28 CT Abd - persistent but decreased small bowel distention  12/31 MR liver - Persistent dilated loops of small and large bowel with air-fluid levels and surgical changes along the anterior abdominal wall. Growth of liver lesion.  GI Surgeries / Procedures:  12/20 Lap VHR w/ mesh 1/2 - liver lesion biopsy  Central access: 12/26 TPN start date: 12/26  Nutritional Goals: Goal TPN rate is 70 mL/hr  + SMOF lipid at 20.8ml/hr x 12 hours  Provides 134 g of protein and 1608 kcals per day (meeting 111%  of protein needs and 80% of kcal needs)  Due to fluid shortages caused by Lilian Hammersmith, Clinimix  will be utilized to meet his nutritional needs as able.  RD Assessment: Estimated Needs Total Energy Estimated Needs: 2000-2200 kcal/d Total Protein Estimated Needs: 105-120g/d Total Fluid Estimated Needs: 2-2.2 L/d  Current Nutrition:  Soft diet 1/2 >> TPN >> reduced to half on 1/3 Boost TID (none taken on 1/4)  Results of calorie count (1/6):  1/4 met 27% EER and 31% est protein; on 1/5 , met 36% EER and 41% est protein  This is suboptimal  Plan:  DC TPN after current bag finishes infusing per Dr. paola Benedetta Elvan ALISON, PharmD, BCPS Clinical Pharmacist 05/17/2023 7:44 AM  Contact: [phone] after 3 PM  Be curious, not judgmental... -Davina Sprinkles

## 2023-05-18 ENCOUNTER — Other Ambulatory Visit (HOSPITAL_COMMUNITY): Payer: Self-pay

## 2023-05-18 LAB — BASIC METABOLIC PANEL
Anion gap: 9 (ref 5–15)
BUN: 54 mg/dL — ABNORMAL HIGH (ref 8–23)
CO2: 15 mmol/L — ABNORMAL LOW (ref 22–32)
Calcium: 9.4 mg/dL (ref 8.9–10.3)
Chloride: 108 mmol/L (ref 98–111)
Creatinine, Ser: 1.79 mg/dL — ABNORMAL HIGH (ref 0.61–1.24)
GFR, Estimated: 41 mL/min — ABNORMAL LOW (ref >60)
Glucose, Bld: 102 mg/dL — ABNORMAL HIGH (ref 70–99)
Potassium: 4.6 mmol/L (ref 3.5–5.1)
Sodium: 132 mmol/L — ABNORMAL LOW (ref 135–145)

## 2023-05-18 LAB — CBC
HCT: 28.9 % — ABNORMAL LOW (ref 39.0–52.0)
Hemoglobin: 10.2 g/dL — ABNORMAL LOW (ref 13.0–17.0)
MCH: 27.1 pg (ref 26.0–34.0)
MCHC: 35.3 g/dL (ref 30.0–36.0)
MCV: 76.7 fL — ABNORMAL LOW (ref 80.0–100.0)
Platelets: 556 10*3/uL — ABNORMAL HIGH (ref 150–400)
RBC: 3.77 MIL/uL — ABNORMAL LOW (ref 4.22–5.81)
RDW: 15.9 % — ABNORMAL HIGH (ref 11.5–15.5)
WBC: 21.7 10*3/uL — ABNORMAL HIGH (ref 4.0–10.5)
nRBC: 0 % (ref 0.0–0.2)

## 2023-05-18 MED ORDER — OXYCODONE HCL 10 MG PO TABS
5.0000 mg | ORAL_TABLET | ORAL | 0 refills | Status: DC | PRN
  Filled 2023-05-18: qty 20, 4d supply, fill #0

## 2023-05-18 MED ORDER — METHOCARBAMOL 500 MG PO TABS
1000.0000 mg | ORAL_TABLET | Freq: Four times a day (QID) | ORAL | 1 refills | Status: DC
  Filled 2023-05-18: qty 240, 30d supply, fill #0

## 2023-05-18 MED ORDER — DOCUSATE SODIUM 100 MG PO CAPS
100.0000 mg | ORAL_CAPSULE | Freq: Two times a day (BID) | ORAL | 1 refills | Status: DC
  Filled 2023-05-18: qty 60, 30d supply, fill #0

## 2023-05-18 MED ORDER — SODIUM CHLORIDE 0.9 % IV BOLUS
1000.0000 mL | Freq: Once | INTRAVENOUS | Status: AC
  Administered 2023-05-18: 1000 mL via INTRAVENOUS

## 2023-05-18 NOTE — Progress Notes (Signed)
 General Surgery Follow Up Note  Subjective:    Overnight Issues:   Objective:  Vital signs for last 24 hours: Temp:  [98 F (36.7 C)-98.5 F (36.9 C)] 98.3 F (36.8 C) (01/08 0739) Pulse Rate:  [71-88] 86 (01/08 0739) Resp:  [17-20] 17 (01/08 0739) BP: (116-139)/(57-78) 139/78 (01/08 0739) SpO2:  [96 %-100 %] 100 % (01/08 0739) Weight:  [88.8 kg] 88.8 kg (01/08 0457)  Hemodynamic parameters for last 24 hours:    Intake/Output from previous day: 01/07 0701 - 01/08 0700 In: 20 [I.V.:20] Out: -   Intake/Output this shift: No intake/output data recorded.  Vent settings for last 24 hours:    Physical Exam:  Gen: comfortable, no distress Neuro: follows commands HEENT: PERRL Neck: supple CV: RRR Pulm: unlabored breathing on RA Abd: soft, incision cdi   GU: urine clear and yellow, +spontaneous void Extr: wwp, no edema  Results for orders placed or performed during the hospital encounter of 04/29/23 (from the past 24 hours)  CBC     Status: Abnormal   Collection Time: 05/18/23  4:01 AM  Result Value Ref Range   WBC 21.7 (H) 4.0 - 10.5 K/uL   RBC 3.77 (L) 4.22 - 5.81 MIL/uL   Hemoglobin 10.2 (L) 13.0 - 17.0 g/dL   HCT 71.0 (L) 60.9 - 47.9 %   MCV 76.7 (L) 80.0 - 100.0 fL   MCH 27.1 26.0 - 34.0 pg   MCHC 35.3 30.0 - 36.0 g/dL   RDW 84.0 (H) 88.4 - 84.4 %   Platelets 556 (H) 150 - 400 K/uL   nRBC 0.0 0.0 - 0.2 %  Basic metabolic panel     Status: Abnormal   Collection Time: 05/18/23  4:01 AM  Result Value Ref Range   Sodium 132 (L) 135 - 145 mmol/L   Potassium 4.6 3.5 - 5.1 mmol/L   Chloride 108 98 - 111 mmol/L   CO2 15 (L) 22 - 32 mmol/L   Glucose, Bld 102 (H) 70 - 99 mg/dL   BUN 54 (H) 8 - 23 mg/dL   Creatinine, Ser 8.20 (H) 0.61 - 1.24 mg/dL   Calcium  9.4 8.9 - 10.3 mg/dL   GFR, Estimated 41 (L) >60 mL/min   Anion gap 9 5 - 15    Assessment & Plan: The plan of care was discussed with the bedside nurse for the day, who is in agreement with this plan  and no additional concerns were raised.   Present on Admission:  Incisional hernia    LOS: 19 days   Additional comments:I reviewed the patient's new clinical lab test results.   and I reviewed the patients new imaging test results.    VHR with biologic mesh -+flatus and BM, incision cdi, JP drain out. Continue mobilization and pain control. Path negative. CT A/P 12/28 without any post-op changes. Removed staples. Incidentally identified enlarging hepatic lesion - suspect metastatic disease, CEA 2, noncon CT chest and MRI liver completed. Plan  IR liver bx 1/2. Palliative consult also placed for GoC/MOST form, discussion 1/3. Path negative for malignancy, but question sampling error. Path results shared with patient, including question of sampling error. Will f/u with Onc Ileus vs SBO - limited PO intake but no nausea yest ID - WBC had normalized, back up today, AF, urine with rare bacteria, no LE/nitr, CT chest not particularly remarkable, bcx 12/31 negative AKI - back to baseline creatinine, continue to hydrate FEN - reg diet, PO intake improved DVT - SCDs, SQH  Dispo - med-surg   [name], MD Trauma & General Surgery Please use [email or web address] to contact on call provider  05/18/2023  *Care during the described time interval was provided by me. I have reviewed this patient's available data, including medical history, events of note, physical examination and test results as part of my evaluation.

## 2023-05-18 NOTE — Discharge Instructions (Addendum)
 [facility]  LAPAROSCOPIC SURGERY: POST OP INSTRUCTIONS Always review your discharge instruction sheet given to you by the facility where your surgery was performed. IF YOU HAVE DISABILITY OR FAMILY LEAVE FORMS, YOU MUST BRING THEM TO THE OFFICE FOR PROCESSING.   DO NOT GIVE THEM TO YOUR DOCTOR.  PAIN CONTROL  Pain regimen: take over-the-counter tylenol  (acetaminophen ) 1000mg  every six hours and the robaxin  (methocarbamol ) 750mg  every six hours. With both of these, you should be taking something every three hours. Example: tylenol  ( acetaminophen ) at 9am, robaxin  (methocarbamol ) at 12pm, tylenol  (acetaminophen ) again at 3pm, robaxin  (methocarbamol ) at 6pm. You also have a prescription for oxycodone , which should be taken if the tylenol  (acetaminophen ), ibuprofen , and robaxin  (methocarbamol ) are not enough to control your pain. You may take the oxycodone  as frequently as every four hours as needed, but if you are taking the other medications as above, you should not need the oxycodone  this frequently. You have also been given a prescription for colace (docusate) which is a stool softener. Please take this as prescribed because the oxycodone  can cause constipation and the colace (docusate) will minimize or prevent constipation. Do not drive while taking or under the influence of the oxycodone  as it is a narcotic medication. Use ice packs to help control pain. If you need a refill on your pain medication, please contact your pharmacy.  They will contact our office to request authorization. Prescriptions will not be filled after 5pm or on week-ends.  HOME MEDICATIONS Take your usually prescribed medications unless otherwise directed.  DIET You should follow a light diet the first few days after arrival home.  Be sure to include lots of fluids daily.   CONSTIPATION It is common to experience some constipation after surgery and if you are taking pain medication.  Increasing fluid  intake and taking a stool softener (such as Colace) will usually help or prevent this problem from occurring.  A mild laxative (Milk of Magnesia or Miralax ) should be taken according to package instructions if there are no bowel movements after 48 hours.  WOUND/INCISION CARE Most patients will experience some swelling and bruising in the area of the incisions.  Ice packs will help.  Swelling and bruising can take several days to resolve.  May shower beginning 05/18/2023.  Do not peel off or scrub skin glue. May allow warm soapy water  to run over incision, then rinse and pat dry.  Do not soak in any water  (tubs, hot tubs, pools, lakes, oceans) for one week after surgery.   ACTIVITIES You may resume regular (light) daily activities beginning the next day--such as daily self-care, walking, climbing stairs--gradually increasing activities as tolerated.  You may have sexual intercourse when it is comfortable.   No lifting greater than 5 pounds for six weeks.  You may drive when you are no longer taking narcotic pain medication, you can comfortably wear a seatbelt, and you can safely maneuver your car and apply brakes.  FOLLOW-UP You should see your doctor in the office for a follow-up appointment approximately 2-3 weeks after your surgery.  You should have been given your post-op/follow-up appointment when your surgery was scheduled.  If you did not receive a post-op/follow-up appointment, make sure that you call for this appointment within a day or two after you arrive home to insure a convenient appointment time.  WHEN TO CALL YOUR DOCTOR: Fever over 101.5 Inability to urinate Continued bleeding from incision. Increased pain, redness, or drainage from the incision. Increasing  abdominal pain  The clinic staff is available to answer your questions during regular business hours.  Please don't hesitate to call and ask to speak to one of the nurses for clinical concerns.  If you have a medical  emergency, go to the nearest emergency room or call 911.  A surgeon from [facility] is always on call at the hospital. [address], [place], [place]  [address] ? [address] [address], [place], [place]   [address] [phone] ? [phone] ? FAX [phone] Web site: [email or web address]

## 2023-05-18 NOTE — Progress Notes (Signed)
 Discharge instructions given. Patient verbalized understanding and all questions were answered.  ?

## 2023-05-18 NOTE — Progress Notes (Signed)
 PT Cancellation Note  Patient Details Name: STANLEE ROEHRIG MRN: [id number] DOB: August 15, 1955   Cancelled Treatment:    Reason Eval/Treat Not Completed: (P) Other (comment) (Pt reports he just had PICC line removed and not yet able to get up, also preparing for DC and wants to save his energy for dressing/discharge.)   [name] 05/18/2023, 5:57 PM

## 2023-05-22 NOTE — Discharge Summary (Signed)
 Patient ID: Thomas [name] [id number] 1955-06-09 68 y.o.  Admit date: 04/29/2023 Discharge date: 05/22/2023  Admitting Diagnosis: [facility]  Discharge Diagnosis Patient Active Problem List   Diagnosis Date Noted   Incisional hernia 04/29/2023   Hypomagnesemia 07/26/2022   Status post colostomy takedown 07/26/2022   Ileus (HCC) 07/26/2022   Bilateral pleural effusion 07/26/2022   Hyperglycemia 07/22/2022   Normocytic anemia 07/14/2022   Atelectasis 07/14/2022   Hypophosphatemia 07/08/2022   Hiccoughs 07/08/2022   Insomnia due to medical condition 07/07/2022   Hypokalemia 07/06/2022   CKD (chronic kidney disease) stage 3b 07/06/2022   Delirium 07/05/2022   History of colorectal cancer 07/01/2022   History of meningioma of the brain 06/07/2022   Incisional hernia, without obstruction or gangrene 06/07/2022   Gastroesophageal reflux disease without esophagitis 03/18/2021   Epilepsy (HCC)    Hyperlipidemia    Peripheral neuropathy    Cerebral embolism with cerebral infarction 02/06/2021   Acute esophagitis    Duodenal ulcer disease    Palliative care by specialist    Shock (HCC)    Acute metabolic encephalopathy 12/04/2020   Protein-calorie malnutrition, moderate (HCC) 12/03/2020   Atrial fibrillation with RVR (HCC)    Sepsis (HCC)    Adverse effect of chemotherapy 12/01/2020   Pressure injury of skin 12/01/2020   Acute renal failure superimposed on stage 2 chronic kidney disease (HCC) 10/01/2020   Hyponatremia 10/01/2020   Iron deficiency anemia due to chronic blood loss 10/01/2020   Colon cancer (HCC) 09/23/2020   History of ischemic left MCA stroke 2015 09/11/2020   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    Rectal polyp    Diverticulosis of colon without hemorrhage    Abdominal pain 09/04/2020   Diabetic polyneuropathy associated with type 2 diabetes mellitus (HCC) 06/28/2018   Venous insufficiency of both lower extremities  06/28/2018   S/P craniotomy 09/01/2016   Trauma    Type 2 diabetes mellitus with diabetic nephropathy, without long-term current use of insulin  (HCC)    Hypertension     Consultants Onc  Reason for Admission: 7M with VH, underwent elective repair  Procedures Lap converted to open [facility] with mesh  Hospital Course:  [phone] with VH, underwent elective repair, converted from lap to open. Ileus post-op. Imaging notable for enlarging liver mass. Onc c/s and w/u to include IR bx, negative for malignancy. Ileus resolved and ultimately discharged.   Physical Exam: Gen: comfortable, no distress Neuro: non-focal exam HEENT: PERRL Neck: supple CV: RRR Pulm: unlabored breathing Abd: soft, NT, incision cdi GU: clear yellow urine Extr: wwp, no edema   Allergies as of 05/18/2023       Reactions   Morphine  And Codeine Other (See Comments)   Throat swelling (can tolerate oxycodone /hydrocodone /hydromorphone )        Medication List     TAKE these medications    acetaminophen  500 MG tablet Commonly known as: TYLENOL  Take 2 tablets (1,000 mg total) by mouth every 6 (six) hours.   allopurinol  100 MG tablet Commonly known as: ZYLOPRIM  Take 100 mg by mouth at bedtime.   amLODipine  10 MG tablet Commonly known as: NORVASC  Take 10 mg by mouth in the morning.   atorvastatin  20 MG tablet Commonly known as: LIPITOR Take 20 mg by mouth in the morning.   docusate sodium  100 MG capsule Commonly known as: COLACE Take 1 capsule (100 mg total) by mouth 2 (two) times daily.   gabapentin  100 MG capsule Commonly known as: NEURONTIN   Take 100-200 mg by mouth See admin instructions. Take 1 capsule (100 mg) by mouth in the morning & take 2 capsules (200 mg) by mouth in the evening.   ibuprofen  600 MG tablet Commonly known as: ADVIL  Take 1 tablet (600 mg total) by mouth 4 (four) times daily.   levETIRAcetam  500 MG tablet Commonly known as: KEPPRA  Take 500 mg by mouth 2 (two) times daily.    LORazepam  0.5 MG tablet Commonly known as: ATIVAN  Take 0.5 mg by mouth 2 (two) times daily as needed for anxiety.   methocarbamol  500 MG tablet Commonly known as: ROBAXIN  Take 2 tablets (1,000 mg total) by mouth 4 (four) times daily.   omeprazole  20 MG capsule Commonly known as: PRILOSEC Take 20 mg by mouth daily before breakfast.   Oxycodone  HCl 10 MG Tabs Take 0.5-1 tablets (5-10 mg total) by mouth every 4 (four) hours as needed for moderate pain (pain score 4-6) or severe pain (pain score 7-10) (5mg  for moderate pain, 10mg  for severe pain).   Polyethyl Glycol-Propyl Glycol 0.4-0.3 % Soln Place 1-2 drops into both eyes 3 (three) times daily as needed (dry/irritated eyes.).   UDDERLY SMOOTH EX Apply 1 Application topically as needed (dry/irritated skin).          Follow-up Information     [name], NP Follow up.   Contact information: [address] [place] [address] [phone]         Paola Dreama SAILOR, MD Follow up on 06/09/2023.   Specialty: Surgery Why: For wound re-check Contact information: [address] [place] [address] [facility] [place] [address] [phone]                  Signed: Dreama GEANNIE Paola, MD [facility] 05/22/2023, 5:05 PM

## 2023-05-25 ENCOUNTER — Ambulatory Visit: Payer: Medicare PPO | Admitting: Oncology

## 2023-07-12 ENCOUNTER — Inpatient Hospital Stay: Payer: Medicare PPO | Attending: Oncology | Admitting: Oncology

## 2023-07-12 ENCOUNTER — Encounter: Payer: Self-pay | Admitting: Oncology

## 2023-07-12 VITALS — BP 139/71 | HR 83 | Temp 98.2°F | Resp 18 | Ht 68.0 in | Wt 213.2 lb

## 2023-07-12 DIAGNOSIS — Z7901 Long term (current) use of anticoagulants: Secondary | ICD-10-CM | POA: Insufficient documentation

## 2023-07-12 DIAGNOSIS — G40909 Epilepsy, unspecified, not intractable, without status epilepticus: Secondary | ICD-10-CM | POA: Diagnosis not present

## 2023-07-12 DIAGNOSIS — Z8042 Family history of malignant neoplasm of prostate: Secondary | ICD-10-CM | POA: Diagnosis not present

## 2023-07-12 DIAGNOSIS — Z930 Tracheostomy status: Secondary | ICD-10-CM | POA: Insufficient documentation

## 2023-07-12 DIAGNOSIS — I10 Essential (primary) hypertension: Secondary | ICD-10-CM | POA: Diagnosis not present

## 2023-07-12 DIAGNOSIS — Z86718 Personal history of other venous thrombosis and embolism: Secondary | ICD-10-CM | POA: Diagnosis not present

## 2023-07-12 DIAGNOSIS — Z9049 Acquired absence of other specified parts of digestive tract: Secondary | ICD-10-CM | POA: Insufficient documentation

## 2023-07-12 DIAGNOSIS — Z803 Family history of malignant neoplasm of breast: Secondary | ICD-10-CM | POA: Insufficient documentation

## 2023-07-12 DIAGNOSIS — C184 Malignant neoplasm of transverse colon: Secondary | ICD-10-CM | POA: Insufficient documentation

## 2023-07-12 DIAGNOSIS — N19 Unspecified kidney failure: Secondary | ICD-10-CM | POA: Diagnosis not present

## 2023-07-12 DIAGNOSIS — E1140 Type 2 diabetes mellitus with diabetic neuropathy, unspecified: Secondary | ICD-10-CM | POA: Diagnosis not present

## 2023-07-12 NOTE — Progress Notes (Signed)
  [facility] OFFICE PROGRESS NOTE   Diagnosis: Colon cancer  INTERVAL HISTORY:   [name] returns as scheduled.  He was discharged from the hospital in January after admission for repair of a ventral hernia.  He developed a postoperative ileus.  He underwent biopsy of an enlarging liver lesion while in the hospital.  The pathology returned negative.  He reports feeling well.  Good appetite.  No complaint.  Objective:  Vital signs in last 24 hours:  Blood pressure 139/71, pulse 83, temperature 98.2 F (36.8 C), temperature source Temporal, resp. rate 18, height 5\' 8"  (1.727 m), weight 213 lb 3.2 oz (96.7 kg), SpO2 98%.   Lymphatics: No cervical, supraclavicular, axillary, or inguinal nodes Resp: Lungs clear bilaterally Cardio: Regular rate and rhythm GI: No hepatosplenomegaly, no mass, healed surgical incisions Vascular: No leg edema     Lab Results:  Lab Results  Component Value Date   WBC 21.7 (H) 05/18/2023   HGB 10.2 (L) 05/18/2023   HCT 28.9 (L) 05/18/2023   MCV 76.7 (L) 05/18/2023   PLT 556 (H) 05/18/2023   NEUTROABS 14.3 (H) 07/15/2022    CMP  Lab Results  Component Value Date   NA 132 (L) 05/18/2023   K 4.6 05/18/2023   CL 108 05/18/2023   CO2 15 (L) 05/18/2023   GLUCOSE 102 (H) 05/18/2023   BUN 54 (H) 05/18/2023   CREATININE 1.79 (H) 05/18/2023   CALCIUM 9.4 05/18/2023   PROT 6.9 05/16/2023   ALBUMIN 2.9 (L) 05/16/2023   AST 13 (L) 05/16/2023   ALT 23 05/16/2023   ALKPHOS 61 05/16/2023   BILITOT 0.8 05/16/2023   GFRNONAA 41 (L) 05/18/2023   GFRAA >60 04/01/2017    Lab Results  Component Value Date   CEA1 2.0 05/09/2023   CEA 1.92 09/10/2022     Medications: I have reviewed the patient's current medications.   Assessment/Plan: Transverse colon cancer, stage IIIb (T3N1a), status post a partial transverse colectomy and end colostomy 09/10/2020 CT abdomen/pelvis 09/04/2020- possible transverse colon mass with small regional  lymph nodes Colonoscopy 09/05/2020- obstructing transverse colon mass-biopsy invasive adenocarcinoma, mass could not be passed CT chest 09/09/2020-no evidence of metastatic disease, 3 x 2 mm subpleural nodule in the right upper lobe likely benign subpleural lymph node Partial transverse colectomy 09/10/2020, transverse colon tumor, no lymphovascular perineural invasion, 1/19 lymph nodes positive, negative margins, MSI stable, no loss of mismatch repair protein expression Cycle 1 CAPOX 10/08/2020, oxaliplatin dose reduced to 100 mg per metered squared secondary to renal insufficiency Cycle 2 CAPOX 10/29/2020 Cycle 3 CAPOX 11/19/2020, capecitabine dose reduced secondary to hand/foot syndrome, oxaliplatin dose reduced secondary to renal failure and poor performance status CT chest/abdomen/pelvis without contrast 12/01/2020-no acute abnormality in the chest, abdomen, or pelvis.  No evidence of obstruction. CTs 03/05/2022-no evidence of recurrent disease CT abdomen/pelvis 07/09/2022 and 07/21/2022-indeterminate segment 7 lesion measuring 1.2 cm CT chest 05/10/2023: New opacity in the right upper lobe-infiltrate?,  Nondependent nodular areas at the lower trachea favor atypical debris MRI abdomen 05/10/2023: 18 x 12 mm cystic lesion in the anterolateral right liver, increased in size from March 2024 05/12/2023: Ultrasound-guided biopsy of right liver lesion, the liver lesion was subtle by ultrasound, benign liver parenchyma -negative for malignancy Resection of a frontal meningioma 09/01/2016 Diabetes Rectal polyp- tubular adenoma on colonoscopy 09/05/2020 Hypertension Diabetic neuropathy Epilepsy Family history of breast and prostate cancer Admission with acute renal failure 10/01/2020-secondary to lack of oral intake and lisinopril, improved with intravenous hydration and  holding lisinopril Admission 12/01/2020-sepsis, diarrhea HHS, A. fib with RVR, abdominal pain 11.  Respiratory failure requiring intubation  12/06/2020 Tracheostomy 12/18/2020 12.  Progressive renal failure-CRRT started 12/06/2020, discontinued after 12/16/2020, hemodialysis initiated 13.  Thrombocytopenia secondary to chemotherapy and sepsis, heparin-induced platelet antibody and SRA negative-resolved 14.  Right lower extremity DVT diagnosed 12/12/2020-on apixaban  15.  Anemia secondary to chronic disease, renal failure, and phlebotomy 16.  Admission 09/29/2021 with rotavirus 17.  Admission 04/29/2023 for an open ventral hernia repair 18.  Postoperative ileus/small bowel obstruction December 2024 19.  Chronic renal failure/acute renal injury following the ventral hernia repair December 2024     Disposition: [name].  He is in clinical remission from colon cancer.  Biopsy of the right liver lesion returned benign, but sampling error was felt to be possible.  He will be scheduled for a surveillance liver MRI in June.  He will return for an office visit after the MRI.  Thomas Papas, MD  07/12/2023  9:51 AM

## 2023-08-22 ENCOUNTER — Other Ambulatory Visit: Payer: Self-pay | Admitting: Student

## 2023-08-22 DIAGNOSIS — I70213 Atherosclerosis of native arteries of extremities with intermittent claudication, bilateral legs: Secondary | ICD-10-CM

## 2023-08-31 ENCOUNTER — Ambulatory Visit
Admission: RE | Admit: 2023-08-31 | Discharge: 2023-08-31 | Disposition: A | Discharge status: Home / Self Care | Source: Ambulatory Visit | Attending: Student | Admitting: Student

## 2023-08-31 DIAGNOSIS — I70213 Atherosclerosis of native arteries of extremities with intermittent claudication, bilateral legs: Secondary | ICD-10-CM

## 2023-10-14 ENCOUNTER — Encounter: Payer: Self-pay | Admitting: Oncology

## 2023-10-31 ENCOUNTER — Inpatient Hospital Stay: Attending: Oncology

## 2023-10-31 DIAGNOSIS — Z85038 Personal history of other malignant neoplasm of large intestine: Secondary | ICD-10-CM | POA: Diagnosis present

## 2023-10-31 DIAGNOSIS — C184 Malignant neoplasm of transverse colon: Secondary | ICD-10-CM

## 2023-10-31 LAB — BASIC METABOLIC PANEL - CANCER CENTER ONLY
Anion gap: 10 (ref 5–15)
BUN: 30 mg/dL — ABNORMAL HIGH (ref 8–23)
CO2: 22 mmol/L (ref 22–32)
Calcium: 9.4 mg/dL (ref 8.9–10.3)
Chloride: 105 mmol/L (ref 98–111)
Creatinine: 1.86 mg/dL — ABNORMAL HIGH (ref 0.61–1.24)
GFR, Estimated: 39 mL/min — ABNORMAL LOW (ref >60)
Glucose, Bld: 106 mg/dL — ABNORMAL HIGH (ref 70–99)
Potassium: 4.5 mmol/L (ref 3.5–5.1)
Sodium: 138 mmol/L (ref 135–145)

## 2023-10-31 LAB — CEA (ACCESS): CEA (CHCC): 2.31 ng/mL (ref 0.00–5.00)

## 2023-11-02 ENCOUNTER — Other Ambulatory Visit

## 2023-11-08 ENCOUNTER — Telehealth: Payer: Self-pay | Admitting: *Deleted

## 2023-11-08 ENCOUNTER — Other Ambulatory Visit: Admitting: Oncology

## 2023-11-08 ENCOUNTER — Telehealth: Payer: Self-pay | Admitting: Oncology

## 2023-11-08 NOTE — Telephone Encounter (Signed)
 Called Thomas [name] to inform him we need to cancel his appointment tomorrow, since his MRI is not scheduled until 11/26/23. He agrees. Scheduler notified.

## 2023-11-08 NOTE — Telephone Encounter (Signed)
 Per secure chat, pt scheduled.

## 2023-11-09 ENCOUNTER — Inpatient Hospital Stay: Admitting: Oncology

## 2023-11-19 ENCOUNTER — Emergency Department (HOSPITAL_BASED_OUTPATIENT_CLINIC_OR_DEPARTMENT_OTHER): Admission: EM | Admit: 2023-11-19 | Discharge: 2023-11-19 | Disposition: A | Discharge status: Home / Self Care

## 2023-11-19 ENCOUNTER — Other Ambulatory Visit: Payer: Self-pay

## 2023-11-19 ENCOUNTER — Emergency Department (HOSPITAL_BASED_OUTPATIENT_CLINIC_OR_DEPARTMENT_OTHER)

## 2023-11-19 ENCOUNTER — Encounter (HOSPITAL_BASED_OUTPATIENT_CLINIC_OR_DEPARTMENT_OTHER): Payer: Self-pay

## 2023-11-19 DIAGNOSIS — K769 Liver disease, unspecified: Secondary | ICD-10-CM

## 2023-11-19 DIAGNOSIS — R1031 Right lower quadrant pain: Secondary | ICD-10-CM | POA: Diagnosis present

## 2023-11-19 DIAGNOSIS — Z85038 Personal history of other malignant neoplasm of large intestine: Secondary | ICD-10-CM | POA: Diagnosis not present

## 2023-11-19 DIAGNOSIS — K4090 Unilateral inguinal hernia, without obstruction or gangrene, not specified as recurrent: Secondary | ICD-10-CM | POA: Insufficient documentation

## 2023-11-19 DIAGNOSIS — N189 Chronic kidney disease, unspecified: Secondary | ICD-10-CM | POA: Insufficient documentation

## 2023-11-19 DIAGNOSIS — R7401 Elevation of levels of liver transaminase levels: Secondary | ICD-10-CM | POA: Insufficient documentation

## 2023-11-19 DIAGNOSIS — K7689 Other specified diseases of liver: Secondary | ICD-10-CM | POA: Insufficient documentation

## 2023-11-19 DIAGNOSIS — K469 Unspecified abdominal hernia without obstruction or gangrene: Secondary | ICD-10-CM

## 2023-11-19 DIAGNOSIS — N183 Chronic kidney disease, stage 3 unspecified: Secondary | ICD-10-CM

## 2023-11-19 LAB — CBC WITH DIFFERENTIAL/PLATELET
Abs Immature Granulocytes: 0.03 K/uL (ref 0.00–0.07)
Basophils Absolute: 0.1 K/uL (ref 0.0–0.1)
Basophils Relative: 1 %
Eosinophils Absolute: 0.4 K/uL (ref 0.0–0.5)
Eosinophils Relative: 4 %
HCT: 36.5 % — ABNORMAL LOW (ref 39.0–52.0)
Hemoglobin: 12.8 g/dL — ABNORMAL LOW (ref 13.0–17.0)
Immature Granulocytes: 0 %
Lymphocytes Relative: 18 %
Lymphs Abs: 2 K/uL (ref 0.7–4.0)
MCH: 26.6 pg (ref 26.0–34.0)
MCHC: 35.1 g/dL (ref 30.0–36.0)
MCV: 75.7 fL — ABNORMAL LOW (ref 80.0–100.0)
Monocytes Absolute: 0.8 K/uL (ref 0.1–1.0)
Monocytes Relative: 7 %
Neutro Abs: 7.8 K/uL — ABNORMAL HIGH (ref 1.7–7.7)
Neutrophils Relative %: 70 %
Platelets: 264 K/uL (ref 150–400)
RBC: 4.82 MIL/uL (ref 4.22–5.81)
RDW: 15.5 % (ref 11.5–15.5)
WBC: 11 K/uL — ABNORMAL HIGH (ref 4.0–10.5)
nRBC: 0 % (ref 0.0–0.2)

## 2023-11-19 LAB — HEPATIC FUNCTION PANEL
ALT: 47 U/L — ABNORMAL HIGH (ref 0–44)
AST: 26 U/L (ref 15–41)
Albumin: 3.9 g/dL (ref 3.5–5.0)
Alkaline Phosphatase: 96 U/L (ref 38–126)
Bilirubin, Direct: 0.2 mg/dL (ref 0.0–0.2)
Indirect Bilirubin: 0.3 mg/dL (ref 0.3–0.9)
Total Bilirubin: 0.5 mg/dL (ref 0.0–1.2)
Total Protein: 7.3 g/dL (ref 6.5–8.1)

## 2023-11-19 LAB — BASIC METABOLIC PANEL WITH GFR
Anion gap: 12 (ref 5–15)
BUN: 38 mg/dL — ABNORMAL HIGH (ref 8–23)
CO2: 20 mmol/L — ABNORMAL LOW (ref 22–32)
Calcium: 9 mg/dL (ref 8.9–10.3)
Chloride: 107 mmol/L (ref 98–111)
Creatinine, Ser: 1.82 mg/dL — ABNORMAL HIGH (ref 0.61–1.24)
GFR, Estimated: 40 mL/min — ABNORMAL LOW (ref >60)
Glucose, Bld: 129 mg/dL — ABNORMAL HIGH (ref 70–99)
Potassium: 4.4 mmol/L (ref 3.5–5.1)
Sodium: 139 mmol/L (ref 135–145)

## 2023-11-19 LAB — LIPASE, BLOOD: Lipase: 23 U/L (ref 11–51)

## 2023-11-19 NOTE — Discharge Instructions (Signed)
 Stay on your medications as prescribed and can continue your follow-up as recommended by your doctors.  Go to your MRI appointment that you have scheduled.  Talk with your PCP about continuing to monitor your liver lesion and your liver function tests.  Return to the ER for new or worsening symptoms.

## 2023-11-19 NOTE — ED Notes (Signed)
 DC paperwork given and verbally understood.

## 2023-11-19 NOTE — ED Triage Notes (Signed)
 In for eval of swelling to RLQ intermittent pain onset approx 2 weeks ago.  PMH colon cancer with colostomy with reversal and hernia removal. Denies N/V/D/constipation. Denies blood in stool.

## 2023-11-19 NOTE — ED Provider Notes (Signed)
  EMERGENCY DEPARTMENT AT [facility] Provider Note   CSN: [id number] Arrival date & time: 11/19/23  9070     Patient presents with: Abdominal Pain   Thomas [name] is a 68 y.o. male.   68 year old male presents for evaluation of multiple complaints.  He has a history of colon cancer has had some increased abdominal pain recently mostly in the right lower quadrant.  Also has been taking more ibuprofen  lately is concerned about his kidney function he does have history of chronic kidney disease.  He states he has had more itching than usual as well.  He denies any vomiting or diarrhea.  Has not had any constipation.  Denies any other symptoms or concerns at this time.        Prior to Admission medications   Medication Sig Start Date End Date Taking? Authorizing Provider  acetaminophen  (TYLENOL ) 500 MG tablet Take 2 tablets (1,000 mg total) by mouth every 6 (six) hours. Patient taking differently: Take 1,000 mg by mouth every 6 (six) hours as needed. 10/08/22   Paola Dreama SAILOR, MD  allopurinol  (ZYLOPRIM ) 100 MG tablet Take 100 mg by mouth at bedtime. 02/15/22   [provider]  amLODipine  (NORVASC ) 10 MG tablet Take 10 mg by mouth in the morning. 05/11/20   [provider]  atorvastatin  (LIPITOR) 20 MG tablet Take 20 mg by mouth in the morning.    [provider]  docusate sodium  (COLACE) 100 MG capsule Take 1 capsule (100 mg total) by mouth 2 (two) times daily. Patient not taking: Reported on 07/12/2023 05/18/23   Paola Dreama SAILOR, MD  Emollient (UDDERLY SMOOTH EX) Apply 1 Application topically as needed (dry/irritated skin).    [provider]  gabapentin  (NEURONTIN ) 100 MG capsule Take 100-200 mg by mouth See admin instructions. Take 1 capsule (100 mg) by mouth in the morning & take 2 capsules (200 mg) by mouth in the evening.    [provider]  ibuprofen  (ADVIL ) 600 MG tablet Take 1 tablet (600 mg total) by mouth 4 (four) times  daily. 10/08/22   Paola Dreama SAILOR, MD  levETIRAcetam  (KEPPRA ) 500 MG tablet Take 500 mg by mouth 2 (two) times daily. 08/27/21   [provider]  LORazepam  (ATIVAN ) 0.5 MG tablet Take 0.5 mg by mouth 2 (two) times daily as needed for anxiety.    [provider]  methocarbamol  (ROBAXIN ) 500 MG tablet Take 2 tablets (1,000 mg total) by mouth 4 (four) times daily. Patient not taking: Reported on 07/12/2023 05/18/23   Paola Dreama SAILOR, MD  omeprazole  (PRILOSEC) 20 MG capsule Take 20 mg by mouth daily before breakfast.    [provider]  Oxycodone  HCl 10 MG TABS Take 0.5-1 tablets (5-10 mg total) by mouth every 4 (four) hours as needed for moderate pain (pain score 4-6) or severe pain (pain score 7-10) (5mg  for moderate pain, 10mg  for severe pain). 05/18/23   Paola Dreama SAILOR, MD  Polyethyl Glycol-Propyl Glycol 0.4-0.3 % SOLN Place 1-2 drops into both eyes 3 (three) times daily as needed (dry/irritated eyes.).    [provider]    Allergies: Morphine  and codeine    Review of Systems  Constitutional:  Negative for chills and fever.  HENT:  Negative for ear pain and sore throat.   Eyes:  Negative for pain and visual disturbance.  Respiratory:  Negative for cough and shortness of breath.   Cardiovascular:  Negative for chest pain and palpitations.  Gastrointestinal:  Positive for abdominal pain. Negative for vomiting.  Genitourinary:  Negative for dysuria and hematuria.  Musculoskeletal:  Negative for arthralgias and back pain.  Skin:  Negative for color change and rash.       Admits itching  Neurological:  Negative for seizures and syncope.  All other systems reviewed and are negative.   Updated Vital Signs BP (!) 150/68 (BP Location: Right Arm)   Pulse 69   Resp 16   Ht 5' 8 (1.727 m)   Wt 99.8 kg   SpO2 97%   BMI 33.45 kg/m   Physical Exam Vitals and nursing note reviewed.  Constitutional:      General: He is not in acute distress.    Appearance:  Normal appearance. He is well-developed. He is not ill-appearing.  HENT:     Head: Normocephalic and atraumatic.  Eyes:     Conjunctiva/sclera: Conjunctivae normal.  Cardiovascular:     Rate and Rhythm: Normal rate and regular rhythm.     Heart sounds: Normal heart sounds. No murmur heard. Pulmonary:     Effort: Pulmonary effort is normal. No respiratory distress.     Breath sounds: Normal breath sounds. No stridor.  Abdominal:     General: Abdomen is flat.     Palpations: Abdomen is soft.     Tenderness: There is abdominal tenderness.     Comments: Mild right lower quadrant tenderness to palpation  Musculoskeletal:        General: No swelling.     Cervical back: Neck supple.  Skin:    General: Skin is warm and dry.     Capillary Refill: Capillary refill takes less than 2 seconds.  Neurological:     Mental Status: He is alert.  Psychiatric:        Mood and Affect: Mood normal.     (all labs ordered are listed, but only abnormal results are displayed) Labs Reviewed  HEPATIC FUNCTION PANEL - Abnormal; Notable for the following components:      Result Value   ALT 47 (*)    All other components within normal limits  CBC WITH DIFFERENTIAL/PLATELET - Abnormal; Notable for the following components:   WBC 11.0 (*)    Hemoglobin 12.8 (*)    HCT 36.5 (*)    MCV 75.7 (*)    Neutro Abs 7.8 (*)    All other components within normal limits  BASIC METABOLIC PANEL WITH GFR - Abnormal; Notable for the following components:   CO2 20 (*)    Glucose, Bld 129 (*)    BUN 38 (*)    Creatinine, Ser 1.82 (*)    GFR, Estimated 40 (*)    All other components within normal limits  LIPASE, BLOOD    EKG: None  Radiology: CT ABDOMEN PELVIS WO CONTRAST Result Date: 11/19/2023 CLINICAL DATA:  Right lower quadrant pain and swelling for 2 weeks. Colon carcinoma. * Tracking Code: BO * EXAM: CT ABDOMEN AND PELVIS WITHOUT CONTRAST TECHNIQUE: Multidetector CT imaging of the abdomen and pelvis was  performed following the standard protocol without IV contrast. RADIATION DOSE REDUCTION: This exam was performed according to the departmental dose-optimization program which includes automated exposure control, adjustment of the mA and/or kV according to patient size and/or use of iterative reconstruction technique. COMPARISON:  05/07/2023 FINDINGS: Lower chest: No acute findings. Hepatobiliary: A 2.0 cm low-attenuation lesion is seen near the junction of the right and left hepatic lobes on image 19/2 which is increased in size compared to prior  studies. This cannot be characterized cyst is without IV contrast. Gallbladder is unremarkable. No evidence of biliary ductal dilatation. Pancreas: No mass or inflammatory process visualized on this unenhanced exam. Spleen:  Within normal limits in size. Adrenals/Urinary tract: No evidence of urolithiasis or hydronephrosis. Stable bilateral renal cysts (No followup imaging is recommended). Mild diffuse bladder wall thickening is seen, presumably due to chronic bladder outlet obstruction given enlarged prostate. Stomach/Bowel: Postop changes again seen from previous colon resection. No evidence of obstruction, inflammatory process, or abnormal fluid collections. Small left inguinal hernia is seen which contains a loop of sigmoid colon. Vascular/Lymphatic: No pathologically enlarged lymph nodes identified. No evidence of abdominal aortic aneurysm. Reproductive:  Mildly enlarged prostate. Other:  None. Musculoskeletal:  No suspicious bone lesions identified. IMPRESSION: No acute findings. Small left inguinal hernia containing a loop of sigmoid colon. No evidence of bowel obstruction or strangulation. 2 cm low-attenuation lesion in the liver, increased in size compared to prior studies. This cannot be characterized without IV contrast, and metastasis cannot be excluded. Nonemergent abdomen MRI without and with contrast is recommended for further characterization. Mildly  enlarged prostate, and findings of chronic bladder outlet obstruction. Electronically Signed   By: Norleen DELENA Kil M.D.   On: 11/19/2023 11:06     Procedures   Medications Ordered in the ED - No data to display                                  Medical Decision Making Medical Decision Making Nursing notes are reviewed. Differential diagnosis for this patient would include but not limited to: Diverticulitis, hernia, liver mets, CKD, other  Emergency Department Course:  Vital signs and pulse oximetry are reviewed, evaluated by myself and found to be within normal limits prior to final disposition. Findings of laboratory testing and medical imaging are discussed with patient and family that is available. Patient agrees with the medical care plan as follows:  Patient with stable vital signs.  Lab workup reviewed by me and unremarkable.  CKD is stable.  He does have a liver lesion on his CT scan that he knew about has an MRI scheduled for as an outpatient to further evaluate it.  He is otherwise feeling well with no complaints.  I discussed all results and plan with him.  He will plan to follow-up with his primary care doctor and otherwise return to the ER for new or worsening symptoms.  He feels comfortable being discharged home.  Problems Addressed: Abdominal hernia without obstruction and without gangrene, recurrence not specified, unspecified hernia type: acute illness or injury History of colon cancer: chronic illness or injury Liver lesion: undiagnosed new problem with uncertain prognosis Stage 3 chronic kidney disease, unspecified whether stage 3a or 3b CKD (HCC): chronic illness or injury  Amount and/or Complexity of Data Reviewed External Data Reviewed: notes.    Details: Outpatient records reviewed and patient is being followed for a liver lesion has a outpatient MRI scheduled in 2 weeks Labs: ordered. Decision-making details documented in ED Course.    Details: Ordered and  reviewed by me and patient's CKD is stable, labs are otherwise fairly unremarkable.  Slight elevation in ALT Radiology: ordered and independent interpretation performed. Decision-making details documented in ED Course.    Details: Ordered and reviewed by me and CT abdomen pelvis shows a liver lesion that is stable , small hernia that contains fat is not incarcerated or strangulated, and  no other acute abnormality when compared to previous but no other acute abnormality  Risk OTC drugs. Prescription drug management.     Final diagnoses:  Abdominal hernia without obstruction and without gangrene, recurrence not specified, unspecified hernia type  Liver lesion  History of colon cancer  Stage 3 chronic kidney disease, unspecified whether stage 3a or 3b CKD [facility])    ED Discharge Orders     None          Gennaro Duwaine CROME, DO 11/19/23 1450

## 2023-11-26 ENCOUNTER — Ambulatory Visit
Admission: RE | Admit: 2023-11-26 | Discharge: 2023-11-26 | Disposition: A | Discharge status: Home / Self Care | Source: Ambulatory Visit | Attending: Oncology | Admitting: Oncology

## 2023-11-26 DIAGNOSIS — C184 Malignant neoplasm of transverse colon: Secondary | ICD-10-CM

## 2023-11-26 MED ORDER — GADOPICLENOL 0.5 MMOL/ML IV SOLN
10.0000 mL | Freq: Once | INTRAVENOUS | Status: AC | PRN
  Administered 2023-11-26: 10 mL via INTRAVENOUS

## 2023-11-28 ENCOUNTER — Ambulatory Visit: Payer: Self-pay | Admitting: Oncology

## 2023-11-30 NOTE — Telephone Encounter (Signed)
-----   Message from Nurse Devere BROCKS sent at 11/30/2023 12:32 PM EDT ----- [place], Can you help with this? ----- Message ----- From: Cloretta Arley NOVAK, MD Sent: 11/28/2023   9:03 PM EDT To: [facility] Clinical  Please call patient, the liver MRI shows one lesion suspicious for cancer, we will discuss treatment options next week, possible radiation or ablation Please schedule for GI conference 7/30, radiology only  ----- Message ----- From: Interface, Rad Results In Sent: 11/28/2023   2:08 AM EDT To: Arley NOVAK Cloretta, MD

## 2023-11-30 NOTE — Telephone Encounter (Signed)
 Called and spoke to patient regarding MRI results and Dr. Andriette response listed below.  Patient verbalized understanding.  Patient advised to contact office if he had any questions or concerns prior to his follow-up appt on 7/29.    Patient was added to GI Conference list on 7/30 for discussion.

## 2023-12-06 ENCOUNTER — Inpatient Hospital Stay: Attending: Oncology | Admitting: Oncology

## 2023-12-06 VITALS — BP 136/70 | HR 87 | Temp 98.3°F | Resp 18 | Ht 68.0 in | Wt 223.2 lb

## 2023-12-06 DIAGNOSIS — I129 Hypertensive chronic kidney disease with stage 1 through stage 4 chronic kidney disease, or unspecified chronic kidney disease: Secondary | ICD-10-CM | POA: Insufficient documentation

## 2023-12-06 DIAGNOSIS — C184 Malignant neoplasm of transverse colon: Secondary | ICD-10-CM | POA: Diagnosis not present

## 2023-12-06 DIAGNOSIS — Z85038 Personal history of other malignant neoplasm of large intestine: Secondary | ICD-10-CM | POA: Insufficient documentation

## 2023-12-06 DIAGNOSIS — K769 Liver disease, unspecified: Secondary | ICD-10-CM | POA: Insufficient documentation

## 2023-12-06 DIAGNOSIS — E1122 Type 2 diabetes mellitus with diabetic chronic kidney disease: Secondary | ICD-10-CM | POA: Insufficient documentation

## 2023-12-06 DIAGNOSIS — Z803 Family history of malignant neoplasm of breast: Secondary | ICD-10-CM | POA: Insufficient documentation

## 2023-12-06 DIAGNOSIS — Z8042 Family history of malignant neoplasm of prostate: Secondary | ICD-10-CM | POA: Insufficient documentation

## 2023-12-06 DIAGNOSIS — N189 Chronic kidney disease, unspecified: Secondary | ICD-10-CM | POA: Insufficient documentation

## 2023-12-06 DIAGNOSIS — Z86718 Personal history of other venous thrombosis and embolism: Secondary | ICD-10-CM | POA: Insufficient documentation

## 2023-12-06 NOTE — Progress Notes (Signed)
 [facility] OFFICE PROGRESS NOTE   Diagnosis: Colon cancer  INTERVAL HISTORY:   Thomas [name] returns as scheduled.  He feels well.  Good appetite.  He reports right abdominal discomfort in the evening for the past 1 month.  The pain is the right abdomen surgical scar.  He has pruritus on the legs. He was seen in the emergency room 11/19/2023.  A CT abdomen/pelvis old and increased size of a right liver lesion and a small left inguinal hernia.  Objective:  Vital signs in last 24 hours:  Blood pressure 136/70, pulse 87, temperature 98.3 F (36.8 C), temperature source Temporal, resp. rate 18, height 5' 8 (1.727 m), weight 223 lb 3.2 oz (101.2 kg), SpO2 100%.     Lymphatics: No cervical, supraclavicular, axillary, or inguinal nodes Resp: Lungs clear bilaterally Cardio: Regular rate and rhythm GI: Mild tenderness in the right subcostal region, no hepatosplenomegaly, no mass, no mass or tenderness surrounding the right abdominal scar Vascular: No leg edema   Lab Results:  Lab Results  Component Value Date   WBC 11.0 (H) 11/19/2023   HGB 12.8 (L) 11/19/2023   HCT 36.5 (L) 11/19/2023   MCV 75.7 (L) 11/19/2023   PLT 264 11/19/2023   NEUTROABS 7.8 (H) 11/19/2023    CMP  Lab Results  Component Value Date   NA 139 11/19/2023   K 4.4 11/19/2023   CL 107 11/19/2023   CO2 20 (L) 11/19/2023   GLUCOSE 129 (H) 11/19/2023   BUN 38 (H) 11/19/2023   CREATININE 1.82 (H) 11/19/2023   CALCIUM  9.0 11/19/2023   PROT 7.3 11/19/2023   ALBUMIN  3.9 11/19/2023   AST 26 11/19/2023   ALT 47 (H) 11/19/2023   ALKPHOS 96 11/19/2023   BILITOT 0.5 11/19/2023   GFRNONAA 40 (L) 11/19/2023   GFRAA >60 04/01/2017    Lab Results  Component Value Date   CEA1 2.0 05/09/2023   CEA 2.31 10/31/2023    Lab Results  Component Value Date   INR 1.1 05/12/2023   LABPROT 14.4 05/12/2023    Imaging:  No results found.  Medications: I have reviewed the patient's current  medications.   Assessment/Plan: Transverse colon cancer, stage IIIb (T3N1a), status post a partial transverse colectomy and end colostomy 09/10/2020 CT abdomen/pelvis 09/04/2020- possible transverse colon mass with small regional lymph nodes Colonoscopy 09/05/2020- obstructing transverse colon mass-biopsy invasive adenocarcinoma, mass could not be passed CT chest 09/09/2020-no evidence of metastatic disease, 3 x 2 mm subpleural nodule in the right upper lobe likely benign subpleural lymph node Partial transverse colectomy 09/10/2020, transverse colon tumor, no lymphovascular perineural invasion, 1/19 lymph nodes positive, negative margins, MSI stable, no loss of mismatch repair protein expression Cycle 1 CAPOX 10/08/2020, oxaliplatin  dose reduced to 100 mg per metered squared secondary to renal insufficiency Cycle 2 CAPOX 10/29/2020 Cycle 3 CAPOX 11/19/2020, capecitabine  dose reduced secondary to hand/foot syndrome, oxaliplatin  dose reduced secondary to renal failure and poor performance status CT chest/abdomen/pelvis without contrast 12/01/2020-no acute abnormality in the chest, abdomen, or pelvis.  No evidence of obstruction. CTs 03/05/2022-no evidence of recurrent disease CT abdomen/pelvis 07/09/2022 and 07/21/2022-indeterminate segment 7 lesion measuring 1.2 cm CT chest 05/10/2023: New opacity in the right upper lobe-infiltrate?,  Nondependent nodular areas at the lower trachea favor atypical debris MRI abdomen 05/10/2023: 18 x 12 mm cystic lesion in the anterolateral right liver, increased in size from March 2024 05/12/2023: Ultrasound-guided biopsy of right liver lesion, the liver lesion was subtle by ultrasound, benign liver parenchyma -  negative for malignancy CT abdomen/pelvis 11/19/2023: Increased size of a 2 cm low-attenuation right liver lesion, small left inguinal hernia MRI liver 11/26/2023: 2 cm lesion in segment 8, progressive from 05/10/2023 MRI Resection of a frontal meningioma  09/01/2016 Diabetes Rectal polyp- tubular adenoma on colonoscopy 09/05/2020 Hypertension Diabetic neuropathy Epilepsy Family history of breast and prostate cancer Admission with acute renal failure 10/01/2020-secondary to lack of oral intake and lisinopril , improved with intravenous hydration and holding lisinopril  Admission 12/01/2020-sepsis, diarrhea HHS, A. fib with RVR, abdominal pain 11.  Respiratory failure requiring intubation 12/06/2020 Tracheostomy 12/18/2020 12.  Progressive renal failure-CRRT started 12/06/2020, discontinued after 12/16/2020, hemodialysis initiated 13.  Thrombocytopenia secondary to chemotherapy and sepsis, heparin -induced platelet antibody and SRA negative-resolved 14.  Right lower extremity DVT diagnosed 12/12/2020-on apixaban   15.  Anemia secondary to chronic disease, renal failure, and phlebotomy 16.  Admission 09/29/2021 with rotavirus 17.  Admission 04/29/2023 for an open ventral hernia repair 18.  Postoperative ileus/small bowel obstruction December 2024 19.  Chronic renal failure/acute renal injury following the ventral hernia repair December 2024      Disposition: Thomas [name] has a history of colon cancer.  He underwent a negative biopsy of a right liver lesion on 05/12/2023.  A CT 11/19/2023 and an MRI 11/26/2023 reveal enlargement of the lesion consistent with a metastasis.  I reviewed the CT and MRI findings and images with Thomas [name].  He will be referred for a staging PET scan.  We will then decide on the indication for a repeat biopsy of the liver lesion and hepatic directed therapy.  His case will be presented at the GI tumor conference tomorrow.  Thomas [name] will return for an office visit in 2 weeks.  I suspect the nighttime right abdomen discomfort is unrelated to the liver lesion.  [name], MD  12/06/2023  11:43 AM

## 2023-12-07 ENCOUNTER — Telehealth: Payer: Self-pay | Admitting: Genetic Counselor

## 2023-12-07 ENCOUNTER — Other Ambulatory Visit: Payer: Self-pay

## 2023-12-07 ENCOUNTER — Encounter (HOSPITAL_COMMUNITY)
Admission: RE | Admit: 2023-12-07 | Discharge: 2023-12-07 | Disposition: A | Discharge status: Home / Self Care | Source: Ambulatory Visit | Attending: Oncology | Admitting: Oncology

## 2023-12-07 ENCOUNTER — Encounter (HOSPITAL_COMMUNITY)

## 2023-12-07 DIAGNOSIS — C184 Malignant neoplasm of transverse colon: Secondary | ICD-10-CM | POA: Insufficient documentation

## 2023-12-07 LAB — GLUCOSE, CAPILLARY: Glucose-Capillary: 83 mg/dL (ref 70–99)

## 2023-12-07 MED ORDER — FLUDEOXYGLUCOSE F - 18 (FDG) INJECTION
11.1000 | Freq: Once | INTRAVENOUS | Status: AC
  Administered 2023-12-07: 11.1 via INTRAVENOUS

## 2023-12-07 NOTE — Telephone Encounter (Signed)
 During GI tumor boards on 12/07/2023, we recommended genetics due to Mr. Lingerfelt's family history of breast cancer in his mother and maternal aunt as well as prostate cancer in his maternal uncle.   Javarius Tsosie, MS, [facility] Genetic Counselor [email or web address] [phone]

## 2023-12-08 NOTE — Progress Notes (Signed)
 The proposed treatment discussed in conference is for discussion purpose only and is not a binding recommendation.  The patients have not been physically examined, or presented with their treatment options.  Therefore, final treatment plans cannot be decided.

## 2023-12-15 ENCOUNTER — Ambulatory Visit: Payer: Self-pay | Admitting: Oncology

## 2023-12-19 NOTE — Telephone Encounter (Signed)
-----   Message from [name] sent at 12/15/2023  6:57 AM EDT ----- Please call patient, the PET scan shows the single liver lesion is hypermetabolic suggestive of cancer, no other evidence of cancer, we will discuss and review the images at the appointment next  week.  We will most likely refer him to interventional radiology for a repeat biopsy and to consider ablation of the lesion  ----- Message ----- From: Interface, Rad Results In Sent: 12/08/2023   2:06 PM EDT To: Arley KATHEE Hof, MD

## 2023-12-19 NOTE — Telephone Encounter (Signed)
Patient gave verbal understanding and had no further questions or concern

## 2023-12-21 ENCOUNTER — Telehealth: Payer: Self-pay | Admitting: Oncology

## 2023-12-21 ENCOUNTER — Inpatient Hospital Stay: Attending: Oncology | Admitting: Oncology

## 2023-12-21 VITALS — BP 135/69 | HR 79 | Temp 98.7°F | Resp 17 | Ht 68.0 in | Wt 225.4 lb

## 2023-12-21 DIAGNOSIS — E1122 Type 2 diabetes mellitus with diabetic chronic kidney disease: Secondary | ICD-10-CM | POA: Diagnosis not present

## 2023-12-21 DIAGNOSIS — Z9049 Acquired absence of other specified parts of digestive tract: Secondary | ICD-10-CM | POA: Diagnosis not present

## 2023-12-21 DIAGNOSIS — Z803 Family history of malignant neoplasm of breast: Secondary | ICD-10-CM | POA: Diagnosis not present

## 2023-12-21 DIAGNOSIS — N189 Chronic kidney disease, unspecified: Secondary | ICD-10-CM | POA: Insufficient documentation

## 2023-12-21 DIAGNOSIS — I129 Hypertensive chronic kidney disease with stage 1 through stage 4 chronic kidney disease, or unspecified chronic kidney disease: Secondary | ICD-10-CM | POA: Insufficient documentation

## 2023-12-21 DIAGNOSIS — Z86718 Personal history of other venous thrombosis and embolism: Secondary | ICD-10-CM | POA: Insufficient documentation

## 2023-12-21 DIAGNOSIS — E1140 Type 2 diabetes mellitus with diabetic neuropathy, unspecified: Secondary | ICD-10-CM | POA: Insufficient documentation

## 2023-12-21 DIAGNOSIS — D631 Anemia in chronic kidney disease: Secondary | ICD-10-CM | POA: Insufficient documentation

## 2023-12-21 DIAGNOSIS — Z933 Colostomy status: Secondary | ICD-10-CM | POA: Insufficient documentation

## 2023-12-21 DIAGNOSIS — C184 Malignant neoplasm of transverse colon: Secondary | ICD-10-CM | POA: Diagnosis present

## 2023-12-21 DIAGNOSIS — C787 Secondary malignant neoplasm of liver and intrahepatic bile duct: Secondary | ICD-10-CM | POA: Diagnosis present

## 2023-12-21 DIAGNOSIS — Z7901 Long term (current) use of anticoagulants: Secondary | ICD-10-CM | POA: Insufficient documentation

## 2023-12-21 DIAGNOSIS — G40909 Epilepsy, unspecified, not intractable, without status epilepticus: Secondary | ICD-10-CM | POA: Insufficient documentation

## 2023-12-21 DIAGNOSIS — Z8042 Family history of malignant neoplasm of prostate: Secondary | ICD-10-CM | POA: Insufficient documentation

## 2023-12-21 DIAGNOSIS — Z930 Tracheostomy status: Secondary | ICD-10-CM | POA: Diagnosis not present

## 2023-12-21 NOTE — Telephone Encounter (Signed)
 Patient has been scheduled for follow-up visit per 12/21/23 LOS.  Pt aware of scheduled appt details.

## 2023-12-21 NOTE — Progress Notes (Signed)
 [facility] OFFICE PROGRESS NOTE   Diagnosis: Colon cancer  INTERVAL HISTORY:   Thomas [name] returns as scheduled.  He feels well.  He continues to have intermittent discomfort at the right abdomen surrounding the colostomy site scar.  He wonders whether he has a recurrent hernia.  The pain improves after eating and is worse after taking medications.  No other complaint.  Objective:  Vital signs in last 24 hours:  Blood pressure 135/69, pulse 79, temperature 98.7 F (37.1 C), temperature source Temporal, resp. rate 17, height 5' 8 (1.727 m), weight 225 lb 6.4 oz (102.2 kg), SpO2 96%.   Resp: Lungs clear bilaterally Cardio: Regular rate and rhythm GI: No hepatosplenomegaly, no mass, nontender Vascular: No leg edema  Lab Results:  Lab Results  Component Value Date   WBC 11.0 (H) 11/19/2023   HGB 12.8 (L) 11/19/2023   HCT 36.5 (L) 11/19/2023   MCV 75.7 (L) 11/19/2023   PLT 264 11/19/2023   NEUTROABS 7.8 (H) 11/19/2023    CMP  Lab Results  Component Value Date   NA 139 11/19/2023   K 4.4 11/19/2023   CL 107 11/19/2023   CO2 20 (L) 11/19/2023   GLUCOSE 129 (H) 11/19/2023   BUN 38 (H) 11/19/2023   CREATININE 1.82 (H) 11/19/2023   CALCIUM  9.0 11/19/2023   PROT 7.3 11/19/2023   ALBUMIN  3.9 11/19/2023   AST 26 11/19/2023   ALT 47 (H) 11/19/2023   ALKPHOS 96 11/19/2023   BILITOT 0.5 11/19/2023   GFRNONAA 40 (L) 11/19/2023   GFRAA >60 04/01/2017    Lab Results  Component Value Date   CEA1 2.0 05/09/2023   CEA 2.31 10/31/2023    Lab Results  Component Value Date   INR 1.1 05/12/2023   LABPROT 14.4 05/12/2023    Imaging:  No results found.  Medications: I have reviewed the patient's current medications.   Assessment/Plan: Transverse colon cancer, stage IIIb (T3N1a), status post a partial transverse colectomy and end colostomy 09/10/2020 CT abdomen/pelvis 09/04/2020- possible transverse colon mass with small regional lymph nodes Colonoscopy  09/05/2020- obstructing transverse colon mass-biopsy invasive adenocarcinoma, mass could not be passed CT chest 09/09/2020-no evidence of metastatic disease, 3 x 2 mm subpleural nodule in the right upper lobe likely benign subpleural lymph node Partial transverse colectomy 09/10/2020, transverse colon tumor, no lymphovascular perineural invasion, 1/19 lymph nodes positive, negative margins, MSI stable, no loss of mismatch repair protein expression Cycle 1 CAPOX 10/08/2020, oxaliplatin  dose reduced to 100 mg per metered squared secondary to renal insufficiency Cycle 2 CAPOX 10/29/2020 Cycle 3 CAPOX 11/19/2020, capecitabine  dose reduced secondary to hand/foot syndrome, oxaliplatin  dose reduced secondary to renal failure and poor performance status CT chest/abdomen/pelvis without contrast 12/01/2020-no acute abnormality in the chest, abdomen, or pelvis.  No evidence of obstruction. CTs 03/05/2022-no evidence of recurrent disease CT abdomen/pelvis 07/09/2022 and 07/21/2022-indeterminate segment 7 lesion measuring 1.2 cm CT chest 05/10/2023: New opacity in the right upper lobe-infiltrate?,  Nondependent nodular areas at the lower trachea favor atypical debris MRI abdomen 05/10/2023: 18 x 12 mm cystic lesion in the anterolateral right liver, increased in size from March 2024 05/12/2023: Ultrasound-guided biopsy of right liver lesion, the liver lesion was subtle by ultrasound, benign liver parenchyma -negative for malignancy CT abdomen/pelvis 11/19/2023: Increased size of a 2 cm low-attenuation right liver lesion, small left inguinal hernia MRI liver 11/26/2023: 2 cm lesion in segment 8, progressive from 05/10/2023 MRI PET 12/07/2023: Segment 8 liver lesion with SUV of 5.0, no other  areas of abnormal uptake Resection of a frontal meningioma 09/01/2016 Diabetes Rectal polyp- tubular adenoma on colonoscopy 09/05/2020 Hypertension Diabetic neuropathy Epilepsy Family history of breast and prostate cancer Admission with acute  renal failure 10/01/2020-secondary to lack of oral intake and lisinopril , improved with intravenous hydration and holding lisinopril  Admission 12/01/2020-sepsis, diarrhea HHS, A. fib with RVR, abdominal pain 11.  Respiratory failure requiring intubation 12/06/2020 Tracheostomy 12/18/2020 12.  Progressive renal failure-CRRT started 12/06/2020, discontinued after 12/16/2020, hemodialysis initiated 13.  Thrombocytopenia secondary to chemotherapy and sepsis, heparin -induced platelet antibody and SRA negative-resolved 14.  Right lower extremity DVT diagnosed 12/12/2020-on apixaban   15.  Anemia secondary to chronic disease, renal failure, and phlebotomy 16.  Admission 09/29/2021 with rotavirus 17.  Admission 04/29/2023 for an open ventral hernia repair 18.  Postoperative ileus/small bowel obstruction December 2024 19.  Chronic renal failure/acute renal injury following the ventral hernia repair December 2024        Disposition: Thomas [name] has a history of colon cancer.  A right liver lesion has increased in size.  He appears to have an isolated liver metastasis now greater than 3 years from the colon cancer diagnosis.  A biopsy of the lesion was negative on 05/12/2023.  The lesion appears consistent with metastasis on MRI and pet imaging.  I discussed the PET findings and reviewed the images with Thomas [name].  He does not wish to consider surgery.  He agrees to an interventional radiology to consider a biopsy/ablation.  I will present his case at the GI tumor conference next week.  He will return for an office visit in 1 month.  [name], MD  12/21/2023  10:58 AM

## 2023-12-26 ENCOUNTER — Ambulatory Visit
Admission: RE | Admit: 2023-12-26 | Discharge: 2023-12-26 | Disposition: A | Discharge status: Home / Self Care | Source: Ambulatory Visit | Attending: Oncology | Admitting: Oncology

## 2023-12-26 DIAGNOSIS — C184 Malignant neoplasm of transverse colon: Secondary | ICD-10-CM

## 2023-12-26 HISTORY — PX: IR RADIOLOGIST EVAL & MGMT: IMG5224

## 2023-12-26 NOTE — Progress Notes (Signed)
 Reason for visit: Enlarging, HM liver mass suspicious for liver metastasis   Care Team(s): Primary Care: [name], NP Medical Oncology; Cloretta Arley NOVAK, MD General Surgeon; Paola Dreama SAILOR, MD  History of Present Illness:  [name] is a pleasant 68 y.o. male 41 comorbid w PMHx significant for colon CA s/p tranverse colectomy 09/2020, followed by ostomy takedown 06/2022 c/b hernia now s/p mesh repair 04/2023. Pt is followed closely by oncology and was noted to have an enlarging R liver lesion on surveillance imaging and identified as a solitary lesion on definitive imaging w MR (05/10/23). Pt is known to VIR service s/p liver mass Bx (05/12/23), which was non-diagnostic and likely a sampling error. Follow up imaging w repeat MR and PET CT continue to demonstrate an enhancing ~2.0 cm liver lesion, HM on PET and suspicious for isolated hepatic metastasis.  He was referred to IR to confirm metastasis and discuss locoregional therapies. He is a reliable historian and has a good understanding of his disease. He is in good spirits with normal strength, mobility, and appetite. He denies fatigue.   Review of Systems: A 12-point ROS discussed, and pertinent positives are indicated in the HPI above.  All other systems are negative.  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   Acute renal failure with acute cortical necrosis (HCC)    Acute respiratory failure with hypoxemia (HCC) 12/07/2020   Acute respiratory failure with hypoxia (HCC) 04/09/2014   Anxiety    Brain tumor (HCC)    frontal   Chronic kidney disease    Colon cancer (HCC)    Confusion    occasionally   Depression    Diabetes mellitus without complication (HCC)    Dizziness    Duodenal ulcer disease    Epilepsy (HCC)    takes Keppra  daily   GERD  (gastroesophageal reflux disease)    takes Omeprazole  daily   Gout    Headache    Heart murmur    Hyperlipidemia    takes Atorvastatin  daily   Hypertension    Meningioma (HCC) 09/01/2016   Peripheral edema    takes Lasix  daily   Peripheral neuropathy    takes Gabapentin  daily   Seizures (HCC)    Sleep apnea    no cpap   Urinary frequency    Viral gastroenteritis 09/29/2021    Past Surgical History:  Procedure Laterality Date   BIOPSY  09/05/2020   Procedure: BIOPSY;  Surgeon: Abran Norleen SAILOR, MD;  Location: [facility] ENDOSCOPY;  Service: Endoscopy;;   BIOPSY  02/06/2021   Procedure: BIOPSY;  Surgeon: Albertus Gordy HERO, MD;  Location: [facility] ENDOSCOPY;  Service: Gastroenterology;;   CARDIAC CATHETERIZATION  7 yrs ago   COLONOSCOPY WITH PROPOFOL  N/A 09/05/2020   Procedure: COLONOSCOPY WITH PROPOFOL ;  Surgeon: Abran Norleen SAILOR, MD;  Location: [facility] ENDOSCOPY;  Service: Endoscopy;  Laterality: N/A;   CRANIOTOMY N/A 09/01/2016   Procedure: CRANIOTOMY TUMOR  LEFT PTERIONAL;  Surgeon: Rockey Peru, MD;  Location: [facility] OR;  Service: Neurosurgery;  Laterality: N/A;   cyst removed from chest      as a child   ESOPHAGOGASTRODUODENOSCOPY (EGD) WITH PROPOFOL  N/A 02/06/2021   Procedure: ESOPHAGOGASTRODUODENOSCOPY (EGD) WITH PROPOFOL ;  Surgeon: Albertus Gordy HERO, MD;  Location: [facility] ENDOSCOPY;  Service: Gastroenterology;  Laterality: N/A;  EYE SURGERY Bilateral 02/08/2023   INGUINAL HERNIA REPAIR N/A 07/01/2022   Procedure: HERNIA REPAIR INGUINAL INCARCERATED;  Surgeon: Sheldon Standing, MD;  Location: [facility] ORS;  Service: General;  Laterality: N/A;   IR FLUORO GUIDE CV LINE LEFT  12/22/2020   IR GASTROSTOMY TUBE MOD SED  02/10/2021   IR GASTROSTOMY TUBE REMOVAL  04/15/2021   IR RADIOLOGIST EVAL & MGMT  12/26/2023   IR REMOVAL TUN CV CATH W/O FL  03/09/2021   IR US  GUIDE VASC ACCESS LEFT  12/22/2020   LAPAROSCOPY  04/29/2023   Procedure: LAPAROSCOPY DIAGNOSTIC;  Surgeon: Paola Dreama SAILOR, MD;  Location: [facility] OR;  Service:  General;;   LYSIS OF ADHESION N/A 07/01/2022   Procedure: LYSIS OF ADHESION;  Surgeon: Sheldon Standing, MD;  Location: [facility] ORS;  Service: General;  Laterality: N/A;   NO PAST SURGERIES     PARTIAL COLECTOMY N/A 09/10/2020   Procedure: TRANSVERSE COLECTOMY;  Surgeon: Paola Dreama SAILOR, MD;  Location: [facility] OR;  Service: General;  Laterality: N/A;   POLYPECTOMY  09/05/2020   Procedure: POLYPECTOMY;  Surgeon: Abran Norleen SAILOR, MD;  Location: [facility] ENDOSCOPY;  Service: Endoscopy;;   PORT-A-CATH REMOVAL N/A 10/08/2022   Procedure: REMOVAL PORT-A-CATH;  Surgeon: Paola Dreama SAILOR, MD;  Location: [facility] OR;  Service: General;  Laterality: N/A;  46   PORTACATH PLACEMENT Right 10/01/2020   Procedure: INSERTION PORT-A-CATH;  Surgeon: Paola Dreama SAILOR, MD;  Location: [facility] OR;  Service: General;  Laterality: Right;   SUBMUCOSAL TATTOO INJECTION  09/05/2020   Procedure: SUBMUCOSAL TATTOO INJECTION;  Surgeon: Abran Norleen SAILOR, MD;  Location: [facility] ENDOSCOPY;  Service: Endoscopy;;   TRACHEOSTOMY TUBE PLACEMENT N/A 01/05/2021   Procedure: TRACHEOSTOMY;  Surgeon: Jesus Oliphant, MD;  Location[facility] OR;  Service: ENT;  Laterality: N/A;   VENTRAL HERNIA REPAIR N/A 04/29/2023   Procedure: VENTRAL HERNIA REPAIR WITH MESH;  Surgeon: Paola Dreama SAILOR, MD;  Location: [facility] OR;  Service: General;  Laterality: N/A;   XI ROBOTIC ASSISTED COLOSTOMY TAKEDOWN N/A 07/01/2022   Procedure: ROBOTIC OSTOMY TAKEDOWN WITH INTRACORPOREAL ANASTOMOSIS WITH FIREFLY;  Surgeon: Sheldon Standing, MD;  Location: [facility] ORS;  Service: General;  Laterality: N/A;    Allergies: Morphine  and codeine  Medications: Prior to Admission medications   Medication Sig Start Date End Date Taking? Authorizing Provider  acetaminophen  (TYLENOL ) 500 MG tablet Take 2 tablets (1,000 mg total) by mouth every 6 (six) hours. 10/08/22   Paola Dreama SAILOR, MD  allopurinol  (ZYLOPRIM ) 100 MG tablet Take 100 mg by mouth at bedtime. 02/15/22   [provider]  amLODipine  (NORVASC ) 10 MG  tablet Take 10 mg by mouth in the morning. 05/11/20   [provider]  atorvastatin  (LIPITOR) 20 MG tablet Take 20 mg by mouth in the morning.    [provider]  dorzolamide-timolol (COSOPT) 2-0.5 % ophthalmic solution Place 1 drop into both eyes 2 (two) times daily. 10/30/23   [provider]  gabapentin  (NEURONTIN ) 100 MG capsule Take 100-200 mg by mouth See admin instructions. Take 1 capsule (100 mg) by mouth in the morning & take 2 capsules (200 mg) by mouth in the evening.    [provider]  ibuprofen  (ADVIL ) 600 MG tablet Take 1 tablet (600 mg total) by mouth 4 (four) times daily. 10/08/22   Paola Dreama SAILOR, MD  levETIRAcetam  (KEPPRA ) 500 MG tablet Take 500 mg by mouth 2 (two) times daily. 08/27/21   [provider]  LORazepam  (ATIVAN ) 0.5 MG tablet Take 0.5 mg  by mouth 2 (two) times daily as needed for anxiety.    [provider]  omeprazole  (PRILOSEC) 20 MG capsule Take 20 mg by mouth daily before breakfast.    [provider]  Oxycodone  HCl 10 MG TABS Take 0.5-1 tablets (5-10 mg total) by mouth every 4 (four) hours as needed for moderate pain (pain score 4-6) or severe pain (pain score 7-10) (5mg  for moderate pain, 10mg  for severe pain). Patient not taking: Reported on 12/06/2023 05/18/23   Paola Dreama SAILOR, MD  Polyethyl Glycol-Propyl Glycol 0.4-0.3 % SOLN Place 1-2 drops into both eyes 3 (three) times daily as needed (dry/irritated eyes.). Patient not taking: Reported on 12/06/2023    [provider]  tamsulosin (FLOMAX) 0.4 MG CAPS capsule 0.4 mg daily after supper. 09/15/23   [provider]     Family History  Problem Relation Age of Onset   Breast cancer Mother    Breast cancer Maternal Aunt    Prostate cancer Maternal Uncle    Colon cancer Neg Hx    Esophageal cancer Neg Hx    Rectal cancer Neg Hx    Stomach cancer Neg Hx     Social History   Socioeconomic History   Marital status: Married    Spouse  name: Clinical biochemist   Number of children: 4   Years of education: Not on file   Highest education level: Not on file  Occupational History   Occupation: Disabled  Tobacco Use   Smoking status: Former    Types: Cigarettes   Smokeless tobacco: Never   Tobacco comments:    quit smoking 35 yrs ago  Vaping Use   Vaping status: Never Used  Substance and Sexual Activity   Alcohol  use: No   Drug use: No   Sexual activity: Not on file  Other Topics Concern   Not on file  Social History Narrative   Married to his wife, Abel with total of #4 children (#2 with current wife). Lives in home with his son and daughter-in-law.     Social Drivers of Corporate investment banker Strain: Not on file  Food Insecurity: No Food Insecurity (04/29/2023)   Hunger Vital Sign    Worried About Running Out of Food in the Last Year: Never true    Ran Out of Food in the Last Year: Never true  Transportation Needs: No Transportation Needs (04/29/2023)   PRAPARE - Administrator, Civil Service (Medical): No    Lack of Transportation (Non-Medical): No  Physical Activity: Not on file  Stress: Not on file  Social Connections: Unknown (05/10/2023)   Social Connection and Isolation Panel    Frequency of Communication with Friends and Family: Never    Frequency of Social Gatherings with Friends and Family: Never    Attends Religious Services: Never    Database administrator or Organizations: No    Attends Engineer, structural: Never    Marital Status: Patient declined    ECOG Status: 0 - Asymptomatic  Review of Systems As above  Vital Signs: BP (!) 142/69 (BP Location: Left Arm, Patient Position: Sitting, Cuff Size: Normal)   Pulse 63   Temp 98.1 F (36.7 C)   Resp 16   SpO2 97%   Physical Exam  General: WN, NAD  CV: RRR on monitor Pulm: normal work of breathing on RA Abd: S, ND, NT MSK: Grossly normal Psych: Appropriate affect.    Imaging:      CT  AP WO,  11/19/23 IMPRESSION:  2 cm low-attenuation lesion in the liver, increased in size compared to prior studies. This cannot be characterized without IV contrast, and metastasis cannot be excluded. Nonemergent abdomen MRI without and with contrast is recommended for further characterization.   IR Radiologist Eval & Mgmt Result Date: 12/26/2023 EXAM: NEW PATIENT OFFICE VISIT CHIEF COMPLAINT: See below HISTORY OF PRESENT ILLNESS: See below REVIEW OF SYSTEMS: See below PHYSICAL EXAMINATION: See below ASSESSMENT AND PLAN: Please refer to completed note in the electronic medical record on [facility] Thom Hall, MD [facility] Radiology Electronically Signed   By: Thom Hall M.D.   On: 12/26/2023 12:25   NM PET Image Initial (PI) Skull Base To Thigh Result Date: 12/08/2023 CLINICAL DATA:  Subsequent treatment strategy for colon cancer. New liver lesion. EXAM: NUCLEAR MEDICINE PET SKULL BASE TO THIGH TECHNIQUE: 11.09 mCi F-18 FDG was injected intravenously. Full-ring PET imaging was performed from the skull base to thigh after the radiotracer. CT data was obtained and used for attenuation correction and anatomic localization. Fasting blood glucose: 83 mg/dl COMPARISON:  MRI abdomen 11/26/2023.  CT 11/19/2023 and older. FINDINGS: Mediastinal blood pool activity: SUV max 2.6 Liver activity: SUV max 3.2 NECK: No specific abnormal uptake seen in the neck including along lymph node change of the submandibular, posterior triangle or internal jugular region. Near symmetric uptake of the visualized intracranial compartment. Incidental CT findings: Surgical changes from prior left-sided craniotomy. The parotid glands, submandibular glands and thyroid  gland are unremarkable. Scattered vascular calcifications. Visualized portions of the paranasal sinuses and mastoid air cells are clear. Scattered vascular calcifications. CHEST: No specific abnormal radiotracer uptake above  blood pool in the axillary regions, hilum or mediastinum. No areas of abnormal lung uptake. Incidental CT findings: There is some linear opacity lung bases likely scar or atelectasis. No consolidation, pneumothorax or effusion. Breathing motion. Heart is nonenlarged. No pericardial effusion. Scattered vascular calcifications are seen including along the coronary arteries. Significant calcification along the origin of the right subclavian artery. ABDOMEN/PELVIS: In the area of subtle lesion measuring 2 cm on the prior MRI along the anterior superior aspect of segment 8 some subtle uptake with maximum SUV of 5.0. This lesion had slowly increased in size going back to a CT scan of March 2024 with contrast. Lesion is seen today on series 4, image 105 of the CT scan. No additional areas of abnormal uptake along the lung parenchyma. Overall physiologic distribution radiotracer along the other parenchymal organs and renal collecting systems. Patchy physiologic uptake along the bowel. No specific abnormal uptake seen along lymph node change in the abdomen and pelvis. Incidental CT findings: On this limited noncontrast attenuation correction CT, grossly the spleen, adrenal glands and pancreas are preserved. Gallbladder is nondilated. Bosniak 1 left-sided renal cysts identified. Retroaortic left renal vein. No renal or ureteral stones. Preserved contour to the urinary bladder. Prominent prostate. Large bowel has a normal course and caliber. Surgical changes. Stomach and small bowel are nondilated. Normal appendix. Protuberance of the anterior abdominal wall with thickening of the anterior abdominal wall which could be postsurgical. Fat containing inguinal hernias. SKELETON: No specific abnormal uptake along the visualized osseous structures. Incidental CT findings: Scattered degenerative changes. IMPRESSION: Mild asymmetric uptake in the area of lesion involving the right hepatic lobe consistent with known liver metastasis.  No other areas of abnormal uptake to suggest additional areas of disease including other liver lesions, lymph nodes. Diffuse vascular calcifications including the  coronary arteries. Please correlate for acquires factors. Surgical changes along the colon. Left-sided renal cyst. Electronically Signed   By: Ranell Bring M.D.   On: 12/08/2023 14:04    Labs:  CBC: Recent Labs    05/16/23 0337 05/17/23 0500 05/18/23 0401 11/19/23 1011  WBC 21.8* 21.0* 21.7* 11.0*  HGB 10.0* 10.1* 10.2* 12.8*  HCT 27.8* 27.9* 28.9* 36.5*  PLT 510* 548* 556* 264    COAGS: Recent Labs    05/12/23 0358  INR 1.1    BMP: Recent Labs    05/17/23 0500 05/18/23 0401 10/31/23 0846 11/19/23 1011  NA 134* 132* 138 139  K 4.5 4.6 4.5 4.4  CL 110 108 105 107  CO2 16* 15* 22 20*  GLUCOSE 117* 102* 106* 129*  BUN 45* 54* 30* 38*  CALCIUM  9.2 9.4 9.4 9.0  CREATININE 1.66* 1.79* 1.86* 1.82*  GFRNONAA 45* 41* 39* 40*    LIVER FUNCTION TESTS: Recent Labs    05/09/23 0106 05/12/23 0358 05/16/23 0337 11/19/23 1011  BILITOT 0.7 0.4 0.8 0.5  AST 14* 22 13* 26  ALT 16 29 23  47*  ALKPHOS 44 46 61 96  PROT 6.0* 5.8* 6.9 7.3  ALBUMIN  2.4* 2.5* 2.9* 3.9    MELD 3.0: 14 at 05/14/2023  4:09 AM MELD-Na: 11 at 05/14/2023  4:09 AM Calculated from: Serum Creatinine: 1.51 mg/dL at 12/12/7972  5:90 AM Serum Sodium: 135 mmol/L at 05/14/2023  4:09 AM Total Bilirubin: 0.4 mg/dL (Using min of 1 mg/dL) at 12/14/7972  6:41 AM Serum Albumin : 2.5 g/dL at 12/14/7972  6:41 AM INR(ratio): 1.1 at 05/12/2023  3:58 AM Age at listing (hypothetical): 43 years Sex: Male at 05/14/2023  4:09 AM   ONCOLOGY: Recent Labs    10/31/23 0846  CEA 2.31     Cancer Staging  Colon cancer [facility]) Staging form: Colon and Rectum, AJCC 8th Edition - Pathologic: Stage IIIB (pT3, pN1a, cM0) - Signed by Cloretta Arley NOVAK, MD on 09/23/2020 Total positive nodes: 1 Histologic grading system: 4 grade system Histologic grade (G): G1 Residual tumor (R):  R0 Lymph-vascular invasion (LVI): LVI not present (absent)/not identified Tumor deposits (TD): Absent Perineural invasion (PNI): Absent Microsatellite instability (MSI): Stable  PATHOLOGY;  SURGICAL PATHOLOGY CASE: [id number] PATIENT: Thomas [name] Surgical Pathology Report  Clinical History: history of colorectal carcinoma, enlarging right lobe liver lesion (cm)  FINAL MICROSCOPIC DIAGNOSIS:  A. LIVER, RIGHT LOBE, NEEDLE CORE BIOPSY: - Benign liver parenchyma with nonspecific reactive changes, see comment - Negative for malignancy - Multiple step sections were examined  COMMENT: Sampling error may need to be ruled out.   Assessment and Plan:  68 y/o M comorbid w PMHx significant for colon CA s/p tranverse colectomy (09/2020), w enlarging R liver lesion suspicious for solitary hepatic metastasis, s/p non-diagnostic liver mass Bx (05/2023)  Colon CA Stage IIIB, suspected IVA. ECOG 0 No underlying liver disease. T2DM and CKD w baseline sCr ~1.8, GFR <60 (~40)  Given his disease presentation, prior non-diagnostic biopsy and underlying comorbidity, I recommended a staged approach of diagnosis and treatment beginning with repeat biopsy w fiducial marker placement to assess for feasibility of percutaneous ablation as an option. Should that be challenging in approach, or he deemed high risk on pre operative clearance, then a trans-arterial approach for treatment with radio-embolization would be recommended.  The procedure(s) have been fully reviewed with the patient/patient's authorized representative. The risks, benefits and alternatives have been explained, and the patient/patient's authorized representative has consented to the procedure(s).  *  MR Abdomen W/WO reviewed, no additional imaging required *request cardiopulmonary clearance, pre-anesthesia evaluation and authorization, for potential MICROWAVE ABLATION of the segment 8 liver mass  *should he obtain clearance and technically  feasible, then ablation to be performed at [facility] with CT-guidance and under GA *anticipate overnight admission post procedure  *in the meantime, to adequately localize the small tumor prior to ablation, will plan for LIVER MASS BIOPSY, and FIDUCIAL MARKER PLACEMENT *to be performed at [facility] in [place] with US -guidance, under moderate sedation *OK to proceed to schedule on mutual availability. *Will obtain MELD labs (CBC, CMP, INR) and CEA on procedure day arrival.  *should he be deemed high risk on pre op clearance or challenging percutaneous ablation approach, then OK to begin authorization process for RIGHT Y90 mapping and treatment.   Thank you for this interesting consult.  I greatly enjoyed meeting Thomas [name] and look forward to participating in their care.  A copy of this report was sent to the requesting provider on this date.  Electronically Signed:  Thom Hall, MD [facility] Radiology   Pager. [phone] Clinic. [phone]  I spent a total of  60 Minutes in face to face in clinical consultation, greater than 50% of which was counseling/coordinating care for [name]'s liver tumor management

## 2023-12-28 ENCOUNTER — Other Ambulatory Visit: Payer: Self-pay

## 2023-12-29 ENCOUNTER — Other Ambulatory Visit (HOSPITAL_COMMUNITY): Payer: Self-pay | Admitting: Interventional Radiology

## 2023-12-29 DIAGNOSIS — R160 Hepatomegaly, not elsewhere classified: Secondary | ICD-10-CM

## 2023-12-30 NOTE — Progress Notes (Signed)
 The proposed treatment discussed in conference is for discussion purpose only and is not a binding recommendation.  The patients have not been physically examined, or presented with their treatment options.  Therefore, final treatment plans cannot be decided.

## 2024-01-03 ENCOUNTER — Other Ambulatory Visit: Payer: Self-pay | Admitting: Radiology

## 2024-01-03 DIAGNOSIS — R160 Hepatomegaly, not elsewhere classified: Secondary | ICD-10-CM

## 2024-01-03 NOTE — H&P (Signed)
 Chief Complaint: Enlarging liver mass suspicious for liver metastasis, colon cancer; referred for image guided liver lesion biopsy and fiducial marker placement  Referring Provider(s): Sherrill,B/  Supervising Physician: Hughes Simmonds  Patient Status: [facility] - Out-pt  History of Present Illness: Thomas [name] is a 68 y.o. 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  Recent follow-up imaging has revealed an enlarging right liver lesion which is hypermetabolic on PET and concerning for isolated metastasis.  He is status post nondiagnostic liver mass biopsy in January of this year.  Patient was seen in consultation by Dr. Hughes on 12/26/23 to discuss treatment options for this liver mass.  He recommended a staged approach of diagnosis and treatment beginning with repeat biopsy of the liver lesion with fiducial marker placement to assess for feasibility of percutaneous ablation as an option.  He presents today for the procedure. Should that be challenging in approach, or he deemed high risk on pre operative clearance, then a trans-arterial approach for treatment with radio-embolization would be recommended.  *** Patient is Full Code  Past Medical History:  Diagnosis Date   Acute encephalopathy 04/09/2014   Acute ischemic left MCA stroke (HCC) 04/09/2014   pt states he did not have a stroke, but knows that this is on his med history.   Acute renal failure with acute cortical necrosis (HCC)    Acute respiratory failure with hypoxemia (HCC) 12/07/2020   Acute respiratory failure with hypoxia (HCC)  04/09/2014   Anxiety    Brain tumor (HCC)    frontal   Chronic kidney disease    Colon cancer (HCC)    Confusion    occasionally   Depression    Diabetes mellitus without complication (HCC)    Dizziness    Duodenal ulcer disease    Epilepsy (HCC)    takes Keppra  daily   GERD (gastroesophageal reflux disease)    takes Omeprazole  daily   Gout    Headache    Heart murmur    Hyperlipidemia    takes Atorvastatin  daily   Hypertension    Meningioma (HCC) 09/01/2016   Peripheral edema    takes Lasix  daily   Peripheral neuropathy    takes Gabapentin  daily   Seizures (HCC)    Sleep apnea    no cpap   Urinary frequency    Viral gastroenteritis 09/29/2021    Past Surgical History:  Procedure Laterality Date   BIOPSY  09/05/2020   Procedure: BIOPSY;  Surgeon: Abran Norleen SAILOR, MD;  Location: [facility] ENDOSCOPY;  Service: Endoscopy;;   BIOPSY  02/06/2021   Procedure: BIOPSY;  Surgeon: Albertus Gordy HERO, MD;  Location: [facility] ENDOSCOPY;  Service: Gastroenterology;;   CARDIAC CATHETERIZATION  7 yrs ago   COLONOSCOPY WITH PROPOFOL  N/A 09/05/2020   Procedure: COLONOSCOPY WITH PROPOFOL ;  Surgeon: Abran Norleen SAILOR, MD;  Location: [facility] ENDOSCOPY;  Service: Endoscopy;  Laterality: N/A;   CRANIOTOMY N/A 09/01/2016   Procedure: CRANIOTOMY TUMOR  LEFT PTERIONAL;  Surgeon: Rockey Peru, MD;  Location: [facility] OR;  Service: Neurosurgery;  Laterality: N/A;   cyst removed from chest      as a child   ESOPHAGOGASTRODUODENOSCOPY (EGD) WITH PROPOFOL  N/A 02/06/2021  Procedure: ESOPHAGOGASTRODUODENOSCOPY (EGD) WITH PROPOFOL ;  Surgeon: Albertus Gordy HERO, MD;  Location: [facility] ENDOSCOPY;  Service: Gastroenterology;  Laterality: N/A;   EYE SURGERY Bilateral 02/08/2023   INGUINAL HERNIA REPAIR N/A 07/01/2022   Procedure: HERNIA REPAIR INGUINAL INCARCERATED;  Surgeon: Sheldon Standing, MD;  Location: [facility] ORS;  Service: General;  Laterality: N/A;   IR FLUORO GUIDE CV LINE LEFT  12/22/2020   IR GASTROSTOMY TUBE MOD SED  02/10/2021   IR  GASTROSTOMY TUBE REMOVAL  04/15/2021   IR RADIOLOGIST EVAL & MGMT  12/26/2023   IR REMOVAL TUN CV CATH W/O FL  03/09/2021   IR US  GUIDE VASC ACCESS LEFT  12/22/2020   LAPAROSCOPY  04/29/2023   Procedure: LAPAROSCOPY DIAGNOSTIC;  Surgeon: Paola Dreama SAILOR, MD;  Location: [facility] OR;  Service: General;;   LYSIS OF ADHESION N/A 07/01/2022   Procedure: LYSIS OF ADHESION;  Surgeon: Sheldon Standing, MD;  Location: [facility] ORS;  Service: General;  Laterality: N/A;   NO PAST SURGERIES     PARTIAL COLECTOMY N/A 09/10/2020   Procedure: TRANSVERSE COLECTOMY;  Surgeon: Paola Dreama SAILOR, MD;  Location: [facility] OR;  Service: General;  Laterality: N/A;   POLYPECTOMY  09/05/2020   Procedure: POLYPECTOMY;  Surgeon: Abran Norleen SAILOR, MD;  Location: [facility] ENDOSCOPY;  Service: Endoscopy;;   PORT-A-CATH REMOVAL N/A 10/08/2022   Procedure: REMOVAL PORT-A-CATH;  Surgeon: Paola Dreama SAILOR, MD;  Location: [facility] OR;  Service: General;  Laterality: N/A;  45   PORTACATH PLACEMENT Right 10/01/2020   Procedure: INSERTION PORT-A-CATH;  Surgeon: Paola Dreama SAILOR, MD;  Location: [facility] OR;  Service: General;  Laterality: Right;   SUBMUCOSAL TATTOO INJECTION  09/05/2020   Procedure: SUBMUCOSAL TATTOO INJECTION;  Surgeon: Abran Norleen SAILOR, MD;  Location: [facility] ENDOSCOPY;  Service: Endoscopy;;   TRACHEOSTOMY TUBE PLACEMENT N/A 01/05/2021   Procedure: TRACHEOSTOMY;  Surgeon: Jesus Oliphant, MD;  Location: [facility] OR;  Service: ENT;  Laterality: N/A;   VENTRAL HERNIA REPAIR N/A 04/29/2023   Procedure: VENTRAL HERNIA REPAIR WITH MESH;  Surgeon: Paola Dreama SAILOR, MD;  Location: [facility] OR;  Service: General;  Laterality: N/A;   XI ROBOTIC ASSISTED COLOSTOMY TAKEDOWN N/A 07/01/2022   Procedure: ROBOTIC OSTOMY TAKEDOWN WITH INTRACORPOREAL ANASTOMOSIS WITH FIREFLY;  Surgeon: Sheldon Standing, MD;  Location: [facility] ORS;  Service: General;  Laterality: N/A;    Allergies: Morphine  and codeine  Medications: Prior to Admission medications   Medication Sig Start Date End Date Taking?  Authorizing Provider  acetaminophen  (TYLENOL ) 500 MG tablet Take 2 tablets (1,000 mg total) by mouth every 6 (six) hours. 10/08/22   Paola Dreama SAILOR, MD  allopurinol  (ZYLOPRIM ) 100 MG tablet Take 100 mg by mouth at bedtime. 02/15/22   [provider]  amLODipine  (NORVASC ) 10 MG tablet Take 10 mg by mouth in the morning. 05/11/20   [provider]  atorvastatin  (LIPITOR) 20 MG tablet Take 20 mg by mouth in the morning.    [provider]  dorzolamide-timolol (COSOPT) 2-0.5 % ophthalmic solution Place 1 drop into both eyes 2 (two) times daily. 10/30/23   [provider]  gabapentin  (NEURONTIN ) 100 MG capsule Take 100-200 mg by mouth See admin instructions. Take 1 capsule (100 mg) by mouth in the morning & take 2 capsules (200 mg) by mouth in the evening.    [provider]  ibuprofen  (ADVIL ) 600 MG tablet Take 1 tablet (600 mg total) by mouth 4 (four) times daily. 10/08/22   Paola Dreama SAILOR, MD  levETIRAcetam  (KEPPRA ) 500 MG tablet Take  500 mg by mouth 2 (two) times daily. 08/27/21   [provider]  LORazepam  (ATIVAN ) 0.5 MG tablet Take 0.5 mg by mouth 2 (two) times daily as needed for anxiety.    [provider]  omeprazole  (PRILOSEC) 20 MG capsule Take 20 mg by mouth daily before breakfast.    [provider]  Oxycodone  HCl 10 MG TABS Take 0.5-1 tablets (5-10 mg total) by mouth every 4 (four) hours as needed for moderate pain (pain score 4-6) or severe pain (pain score 7-10) (5mg  for moderate pain, 10mg  for severe pain). Patient not taking: No sig reported 05/18/23   Paola Dreama SAILOR, MD  Polyethyl Glycol-Propyl Glycol 0.4-0.3 % SOLN Place 1-2 drops into both eyes 3 (three) times daily as needed (dry/irritated eyes.).    [provider]  tamsulosin (FLOMAX) 0.4 MG CAPS capsule 0.4 mg daily after supper. 09/15/23   [provider]     Family History  Problem Relation Age of Onset   Breast cancer Mother    Breast  cancer Maternal Aunt    Prostate cancer Maternal Uncle    Colon cancer Neg Hx    Esophageal cancer Neg Hx    Rectal cancer Neg Hx    Stomach cancer Neg Hx     Social History   Socioeconomic History   Marital status: Married    Spouse name: Clinical biochemist   Number of children: 4   Years of education: Not on file   Highest education level: Not on file  Occupational History   Occupation: Disabled  Tobacco Use   Smoking status: Former    Types: Cigarettes   Smokeless tobacco: Never   Tobacco comments:    quit smoking 35 yrs ago  Vaping Use   Vaping status: Never Used  Substance and Sexual Activity   Alcohol  use: No   Drug use: No   Sexual activity: Not on file  Other Topics Concern   Not on file  Social History Narrative   Married to his wife, Abel with total of #4 children (#2 with current wife). Lives in home with his son and daughter-in-law.     Social Drivers of Corporate investment banker Strain: Not on file  Food Insecurity: No Food Insecurity (04/29/2023)   Hunger Vital Sign    Worried About Running Out of Food in the Last Year: Never true    Ran Out of Food in the Last Year: Never true  Transportation Needs: No Transportation Needs (04/29/2023)   PRAPARE - Administrator, Civil Service (Medical): No    Lack of Transportation (Non-Medical): No  Physical Activity: Not on file  Stress: Not on file  Social Connections: Unknown (05/10/2023)   Social Connection and Isolation Panel    Frequency of Communication with Friends and Family: Never    Frequency of Social Gatherings with Friends and Family: Never    Attends Religious Services: Never    Database administrator or Organizations: No    Attends Engineer, structural: Never    Marital Status: Patient declined       Review of Systems  Vital Signs:   Advance Care Plan: No documents on file    Physical Exam  Imaging: IR Radiologist Eval & Mgmt Result Date: 12/26/2023 EXAM: NEW  PATIENT OFFICE VISIT CHIEF COMPLAINT: See below HISTORY OF PRESENT ILLNESS: See below REVIEW OF SYSTEMS: See below PHYSICAL EXAMINATION: See below ASSESSMENT AND PLAN: Please refer to completed note in the electronic  medical record on [place] [facility] Thom Hall, MD [facility] Electronically Signed   By: Thom Hall M.D.   On: 12/26/2023 12:25   NM PET Image Initial (PI) Skull Base To Thigh Result Date: 12/08/2023 CLINICAL DATA:  Subsequent treatment strategy for colon cancer. New liver lesion. EXAM: NUCLEAR MEDICINE PET SKULL BASE TO THIGH TECHNIQUE: 11.09 mCi F-18 FDG was injected intravenously. Full-ring PET imaging was performed from the skull base to thigh after the radiotracer. CT data was obtained and used for attenuation correction and anatomic localization. Fasting blood glucose: 83 mg/dl COMPARISON:  MRI abdomen 11/26/2023.  CT 11/19/2023 and older. FINDINGS: Mediastinal blood pool activity: SUV max 2.6 Liver activity: SUV max 3.2 NECK: No specific abnormal uptake seen in the neck including along lymph node change of the submandibular, posterior triangle or internal jugular region. Near symmetric uptake of the visualized intracranial compartment. Incidental CT findings: Surgical changes from prior left-sided craniotomy. The parotid glands, submandibular glands and thyroid  gland are unremarkable. Scattered vascular calcifications. Visualized portions of the paranasal sinuses and mastoid air cells are clear. Scattered vascular calcifications. CHEST: No specific abnormal radiotracer uptake above blood pool in the axillary regions, hilum or mediastinum. No areas of abnormal lung uptake. Incidental CT findings: There is some linear opacity lung bases likely scar or atelectasis. No consolidation, pneumothorax or effusion. Breathing motion. Heart is nonenlarged. No pericardial effusion. Scattered vascular calcifications are seen including along the  coronary arteries. Significant calcification along the origin of the right subclavian artery. ABDOMEN/PELVIS: In the area of subtle lesion measuring 2 cm on the prior MRI along the anterior superior aspect of segment 8 some subtle uptake with maximum SUV of 5.0. This lesion had slowly increased in size going back to a CT scan of March 2024 with contrast. Lesion is seen today on series 4, image 105 of the CT scan. No additional areas of abnormal uptake along the lung parenchyma. Overall physiologic distribution radiotracer along the other parenchymal organs and renal collecting systems. Patchy physiologic uptake along the bowel. No specific abnormal uptake seen along lymph node change in the abdomen and pelvis. Incidental CT findings: On this limited noncontrast attenuation correction CT, grossly the spleen, adrenal glands and pancreas are preserved. Gallbladder is nondilated. Bosniak 1 left-sided renal cysts identified. Retroaortic left renal vein. No renal or ureteral stones. Preserved contour to the urinary bladder. Prominent prostate. Large bowel has a normal course and caliber. Surgical changes. Stomach and small bowel are nondilated. Normal appendix. Protuberance of the anterior abdominal wall with thickening of the anterior abdominal wall which could be postsurgical. Fat containing inguinal hernias. SKELETON: No specific abnormal uptake along the visualized osseous structures. Incidental CT findings: Scattered degenerative changes. IMPRESSION: Mild asymmetric uptake in the area of lesion involving the right hepatic lobe consistent with known liver metastasis. No other areas of abnormal uptake to suggest additional areas of disease including other liver lesions, lymph nodes. Diffuse vascular calcifications including the coronary arteries. Please correlate for acquires factors. Surgical changes along the colon. Left-sided renal cyst. Electronically Signed   By: Ranell Bring M.D.   On: 12/08/2023 14:04     Labs:  CBC: Recent Labs    05/16/23 0337 05/17/23 0500 05/18/23 0401 11/19/23 1011  WBC 21.8* 21.0* 21.7* 11.0*  HGB 10.0* 10.1* 10.2* 12.8*  HCT 27.8* 27.9* 28.9* 36.5*  PLT 510* 548* 556* 264    COAGS: Recent Labs    05/12/23 0358  INR 1.1  BMP: Recent Labs    05/17/23 0500 05/18/23 0401 10/31/23 0846 11/19/23 1011  NA 134* 132* 138 139  K 4.5 4.6 4.5 4.4  CL 110 108 105 107  CO2 16* 15* 22 20*  GLUCOSE 117* 102* 106* 129*  BUN 45* 54* 30* 38*  CALCIUM  9.2 9.4 9.4 9.0  CREATININE 1.66* 1.79* 1.86* 1.82*  GFRNONAA 45* 41* 39* 40*    LIVER FUNCTION TESTS: Recent Labs    05/09/23 0106 05/12/23 0358 05/16/23 0337 11/19/23 1011  BILITOT 0.7 0.4 0.8 0.5  AST 14* 22 13* 26  ALT 16 29 23  47*  ALKPHOS 44 46 61 96  PROT 6.0* 5.8* 6.9 7.3  ALBUMIN  2.4* 2.5* 2.9* 3.9    TUMOR MARKERS: Recent Labs    10/31/23 0846  CEA 2.31    Assessment and Plan: 67 y.o. 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  Recent follow-up imaging has revealed an enlarging right liver lesion which is hypermetabolic on PET and concerning for isolated metastasis.  He is status post nondiagnostic liver mass biopsy in January of this year.  Patient was seen in consultation by Dr. Hughes on 12/26/23 to discuss treatment options for this liver mass.  He recommended a staged approach of diagnosis and treatment beginning with repeat biopsy of the liver lesion with fiducial marker placement to assess for feasibility of percutaneous ablation as an option.  He presents today for the procedure.  Should that be challenging in approach, or he deemed high risk on pre operative clearance, then a trans-arterial approach for treatment with radio-embolization would be recommended.Risks and benefits of procedure was discussed with the patient including, but not limited to bleeding, infection, damage to adjacent structures or low yield requiring additional tests.  All of the questions were answered and there is agreement to proceed.  Consent signed and in chart.    Thank you for allowing our service to participate in TALLIS SOLEDAD 's care.  Electronically Signed: D. Franky Rakers, PA-C   01/03/2024, 4:44 PM      I spent a total of    25 Minutes in face to face in clinical consultation, greater than 50% of which was counseling/coordinating care for image guided liver biopsy and fiducial marker placement

## 2024-01-04 ENCOUNTER — Encounter (HOSPITAL_COMMUNITY): Payer: Self-pay

## 2024-01-04 ENCOUNTER — Other Ambulatory Visit (HOSPITAL_COMMUNITY): Payer: Self-pay | Admitting: Interventional Radiology

## 2024-01-04 ENCOUNTER — Other Ambulatory Visit: Payer: Self-pay | Admitting: Radiology

## 2024-01-04 ENCOUNTER — Ambulatory Visit (HOSPITAL_COMMUNITY)
Admission: RE | Admit: 2024-01-04 | Discharge: 2024-01-04 | Disposition: A | Discharge status: Home / Self Care | Source: Ambulatory Visit | Attending: Interventional Radiology | Admitting: Interventional Radiology

## 2024-01-04 ENCOUNTER — Other Ambulatory Visit: Payer: Self-pay

## 2024-01-04 DIAGNOSIS — Z860100 Personal history of colon polyps, unspecified: Secondary | ICD-10-CM | POA: Insufficient documentation

## 2024-01-04 DIAGNOSIS — Z86718 Personal history of other venous thrombosis and embolism: Secondary | ICD-10-CM | POA: Diagnosis not present

## 2024-01-04 DIAGNOSIS — Z79899 Other long term (current) drug therapy: Secondary | ICD-10-CM | POA: Insufficient documentation

## 2024-01-04 DIAGNOSIS — F419 Anxiety disorder, unspecified: Secondary | ICD-10-CM | POA: Insufficient documentation

## 2024-01-04 DIAGNOSIS — E1122 Type 2 diabetes mellitus with diabetic chronic kidney disease: Secondary | ICD-10-CM | POA: Diagnosis not present

## 2024-01-04 DIAGNOSIS — E1142 Type 2 diabetes mellitus with diabetic polyneuropathy: Secondary | ICD-10-CM | POA: Insufficient documentation

## 2024-01-04 DIAGNOSIS — C787 Secondary malignant neoplasm of liver and intrahepatic bile duct: Secondary | ICD-10-CM | POA: Insufficient documentation

## 2024-01-04 DIAGNOSIS — R160 Hepatomegaly, not elsewhere classified: Secondary | ICD-10-CM

## 2024-01-04 DIAGNOSIS — Z791 Long term (current) use of non-steroidal anti-inflammatories (NSAID): Secondary | ICD-10-CM | POA: Insufficient documentation

## 2024-01-04 DIAGNOSIS — N281 Cyst of kidney, acquired: Secondary | ICD-10-CM | POA: Diagnosis not present

## 2024-01-04 DIAGNOSIS — G40909 Epilepsy, unspecified, not intractable, without status epilepticus: Secondary | ICD-10-CM | POA: Insufficient documentation

## 2024-01-04 DIAGNOSIS — D631 Anemia in chronic kidney disease: Secondary | ICD-10-CM | POA: Insufficient documentation

## 2024-01-04 DIAGNOSIS — N189 Chronic kidney disease, unspecified: Secondary | ICD-10-CM | POA: Insufficient documentation

## 2024-01-04 DIAGNOSIS — C19 Malignant neoplasm of rectosigmoid junction: Secondary | ICD-10-CM | POA: Insufficient documentation

## 2024-01-04 DIAGNOSIS — Z87891 Personal history of nicotine dependence: Secondary | ICD-10-CM | POA: Insufficient documentation

## 2024-01-04 DIAGNOSIS — I129 Hypertensive chronic kidney disease with stage 1 through stage 4 chronic kidney disease, or unspecified chronic kidney disease: Secondary | ICD-10-CM | POA: Insufficient documentation

## 2024-01-04 DIAGNOSIS — K4090 Unilateral inguinal hernia, without obstruction or gangrene, not specified as recurrent: Secondary | ICD-10-CM | POA: Diagnosis not present

## 2024-01-04 LAB — COMPREHENSIVE METABOLIC PANEL WITH GFR
ALT: 31 U/L (ref 0–44)
AST: 27 U/L (ref 15–41)
Albumin: 4 g/dL (ref 3.5–5.0)
Alkaline Phosphatase: 95 U/L (ref 38–126)
Anion gap: 12 (ref 5–15)
BUN: 32 mg/dL — ABNORMAL HIGH (ref 8–23)
CO2: 20 mmol/L — ABNORMAL LOW (ref 22–32)
Calcium: 9.4 mg/dL (ref 8.9–10.3)
Chloride: 107 mmol/L (ref 98–111)
Creatinine, Ser: 1.88 mg/dL — ABNORMAL HIGH (ref 0.61–1.24)
GFR, Estimated: 39 mL/min — ABNORMAL LOW (ref >60)
Glucose, Bld: 94 mg/dL (ref 70–99)
Potassium: 4.7 mmol/L (ref 3.5–5.1)
Sodium: 139 mmol/L (ref 135–145)
Total Bilirubin: 0.5 mg/dL (ref 0.0–1.2)
Total Protein: 7.4 g/dL (ref 6.5–8.1)

## 2024-01-04 LAB — CBC WITH DIFFERENTIAL/PLATELET
Abs Immature Granulocytes: 0.02 K/uL (ref 0.00–0.07)
Basophils Absolute: 0.1 K/uL (ref 0.0–0.1)
Basophils Relative: 1 %
Eosinophils Absolute: 0.4 K/uL (ref 0.0–0.5)
Eosinophils Relative: 4 %
HCT: 39.4 % (ref 39.0–52.0)
Hemoglobin: 13.3 g/dL (ref 13.0–17.0)
Immature Granulocytes: 0 %
Lymphocytes Relative: 19 %
Lymphs Abs: 2 K/uL (ref 0.7–4.0)
MCH: 26.1 pg (ref 26.0–34.0)
MCHC: 33.8 g/dL (ref 30.0–36.0)
MCV: 77.4 fL — ABNORMAL LOW (ref 80.0–100.0)
Monocytes Absolute: 1 K/uL (ref 0.1–1.0)
Monocytes Relative: 9 %
Neutro Abs: 7.1 K/uL (ref 1.7–7.7)
Neutrophils Relative %: 67 %
Platelets: 245 K/uL (ref 150–400)
RBC: 5.09 MIL/uL (ref 4.22–5.81)
RDW: 14.6 % (ref 11.5–15.5)
WBC: 10.7 K/uL — ABNORMAL HIGH (ref 4.0–10.5)
nRBC: 0 % (ref 0.0–0.2)

## 2024-01-04 LAB — PROTIME-INR
INR: 1 (ref 0.8–1.2)
Prothrombin Time: 13.7 s (ref 11.4–15.2)

## 2024-01-04 LAB — GLUCOSE, CAPILLARY: Glucose-Capillary: 96 mg/dL (ref 70–99)

## 2024-01-04 MED ORDER — SODIUM CHLORIDE 0.9 % IV SOLN: INTRAVENOUS | Status: DC

## 2024-01-04 MED ORDER — FENTANYL CITRATE (PF) 100 MCG/2ML IJ SOLN
INTRAMUSCULAR | Status: AC | PRN
  Administered 2024-01-04 (×2): 50 ug via INTRAVENOUS

## 2024-01-04 MED ORDER — MIDAZOLAM HCL 2 MG/2ML IJ SOLN
INTRAMUSCULAR | Status: AC
  Filled 2024-01-04: qty 4

## 2024-01-04 MED ORDER — MIDAZOLAM HCL 2 MG/2ML IJ SOLN
INTRAMUSCULAR | Status: AC | PRN
  Administered 2024-01-04 (×2): 1 mg via INTRAVENOUS

## 2024-01-04 MED ORDER — HYDROCODONE-ACETAMINOPHEN 5-325 MG PO TABS: 2.0000 | ORAL_TABLET | Freq: Four times a day (QID) | ORAL | Status: DC | PRN

## 2024-01-04 MED ORDER — FENTANYL CITRATE (PF) 100 MCG/2ML IJ SOLN
INTRAMUSCULAR | Status: AC
  Filled 2024-01-04: qty 2

## 2024-01-04 NOTE — Discharge Instructions (Signed)
 Please call Interventional Radiology clinic [phone] with any questions or concerns.   You may remove your dressing and shower tomorrow.   Liver Biopsy, Care After After a liver biopsy, it is common to have these things in the area where the biopsy was done. You may: Have pain. Feel sore. Have bruising. You may also feel tired for a few days. Follow these instructions at home: Medicines Take over-the-counter and prescription medicines only as told by your doctor. If you were prescribed an antibiotic medicine, take it as told by your doctor. Do not stop taking the antibiotic, even if you start to feel better. Do not take medicines that may thin your blood. These medicines include aspirin and ibuprofen. Take them only if your doctor tells you to. If told, take steps to prevent problems with pooping (constipation). You may need to: Drink enough fluid to keep your pee (urine) pale yellow. Take medicines. You will be told what medicines to take. Eat foods that are high in fiber. These include beans, whole grains, and fresh fruits and vegetables. Limit foods that are high in fat and sugar. These include fried or sweet foods. Ask your doctor if you should avoid driving or using machines while you are taking your medicine. Caring for your incision Follow instructions from your doctor about how to take care of your cut from surgery (incisions). Make sure you: Wash your hands with soap and water for at least 20 seconds before and after you change your bandage. If you cannot use soap and water, use hand sanitizer. Change your bandage. Leavestitches or skin glue in place for at least two weeks. Leave tape strips alone unless you are told to take them off. You may trim the edges of the tape strips if they curl up. Check your incision every day for signs of infection. Check for: Redness, swelling, or more pain. Fluid or blood. Warmth. Pus or a bad smell. Do not take baths, swim, or use a  hot tub. Ask your doctor about taking showers or sponge baths. Activity Rest at home for 1-2 days, or as told by your doctor. Get up to take short walks every 1 to 2 hours. Ask for help if you feel weak or unsteady. Do not lift anything that is heavier than 10 lb (4.5 kg), or the limit that you are told. Do not play contact sports for 2 weeks after the procedure. Return to your normal activities as told by your doctor. Ask what activities are safe for you. General instructions  Do not drink alcohol in the first week after the procedure. Plan to have a responsible adult care for you for the time you are told after you leave the hospital or clinic. This is important. It is up to you to get the results of your procedure. Ask how to get your results when they are ready. Keep all follow-up visits.   Contact a doctor if: You have more bleeding in your incision. Your incision swells, or is red and more painful. You have fluid that comes from your incision. You develop a rash. You have fever or chills. Get help right away if: You have swelling, bloating, or pain in your belly (abdomen). You get dizzy or faint. You vomit or you feel like vomiting. You have trouble breathing or feel short of breath. You have chest pain. You have problems talking or seeing. You have trouble with your balance or moving your arms or legs. These symptoms may be an emergency. Get help  right away. Call your local emergency services (911 in the [place] Do not wait to see if the symptoms will go away. Do not drive yourself to the hospital. Summary After the procedure, it is common to have pain, soreness, bruising, and tiredness. Your doctor will tell you how to take care of yourself at home. Change your bandage, take your medicines, and limit your activities as told by your doctor. Call your doctor if you have symptoms of infection. Get help right away if your belly swells, your cut bleeds a lot, or you have trouble  talking or breathing. This information is not intended to replace advice given to you by your health care provider. Make sure you discuss any questions you have with your healthcare provider. Document Revised: 03/10/2020 Document Reviewed: 03/10/2020 Elsevier Patient Education  2022 Elsevier Inc.     Moderate Conscious Sedation, Adult, Care After This sheet gives you information about how to care for yourself after your procedure. Your health care provider may also give you more specific instructions. If you have problems or questions, contact your health care provider. What can I expect after the procedure? After the procedure, it is common to have: Sleepiness for several hours. Impaired judgment for several hours. Difficulty with balance. Vomiting if you eat too soon. Follow these instructions at home: For the time period you were told by your health care provider: Rest. Do not participate in activities where you could fall or become injured. Do not drive or use machinery. Do not drink alcohol. Do not take sleeping pills or medicines that cause drowsiness. Do not make important decisions or sign legal documents. Do not take care of children on your own.        Eating and drinking Follow the diet recommended by your health care provider. Drink enough fluid to keep your urine pale yellow. If you vomit: Drink water, juice, or soup when you can drink without vomiting. Make sure you have little or no nausea before eating solid foods.    General instructions Take over-the-counter and prescription medicines only as told by your health care provider. Have a responsible adult stay with you for the time you are told. It is important to have someone help care for you until you are awake and alert. Do not smoke. Keep all follow-up visits as told by your health care provider. This is important. Contact a health care provider if: You are still sleepy or having trouble with balance after  24 hours. You feel light-headed. You keep feeling nauseous or you keep vomiting. You develop a rash. You have a fever. You have redness or swelling around the IV site. Get help right away if: You have trouble breathing. You have new-onset confusion at home. Summary After the procedure, it is common to feel sleepy, have impaired judgment, or feel nauseous if you eat too soon. Rest after you get home. Know the things you should not do after the procedure. Follow the diet recommended by your health care provider and drink enough fluid to keep your urine pale yellow. Get help right away if you have trouble breathing or new-onset confusion at home. This information is not intended to replace advice given to you by your health care provider. Make sure you discuss any questions you have with your health care provider. Document Revised: 08/24/2019 Document Reviewed: 03/22/2019 Elsevier Patient Education  2021 [employer].

## 2024-01-04 NOTE — Procedures (Signed)
 Vascular and Interventional Radiology Procedure Note  Patient: Thomas [name] DOB: [birth year] Medical Record Number: [id number] Note Date/Time: 01/04/24 11:47 AM   Performing Physician: Thom Hall, MD Assistant(s): None  Diagnosis: Hx colon CA w HM liver lesion. Prior Bx negative.   Procedure: LIVER MASS BIOPSY and FIDUCIAL MARKER PLACEMENT  Anesthesia: Conscious Sedation Complications: None Estimated Blood Loss: Minimal Specimens: Sent for Pathology  Findings:  Successful Ultrasound and CT-guided biopsy and fiducial marker placement of a R hepatic lobe mass (seg VIII) A total of 3 samples were obtained. Hemostasis of the tract was achieved using Manual Pressure.  Plan: Bed rest for 2 hours.  See detailed procedure note with images in PACS. The patient tolerated the procedure well without incident or complication and was returned to Recovery in stable condition.    Thom Hall, MD [facility] Inter[facility] Radiology   Pager. [phone] Clinic. [phone]

## 2024-01-05 LAB — CEA: CEA: 5.1 ng/mL — ABNORMAL HIGH (ref 0.0–4.7)

## 2024-01-06 LAB — SURGICAL PATHOLOGY

## 2024-01-13 ENCOUNTER — Other Ambulatory Visit (HOSPITAL_COMMUNITY): Payer: Self-pay | Admitting: Interventional Radiology

## 2024-01-13 ENCOUNTER — Telehealth: Payer: Self-pay | Admitting: *Deleted

## 2024-01-13 DIAGNOSIS — R160 Hepatomegaly, not elsewhere classified: Secondary | ICD-10-CM

## 2024-01-13 NOTE — Telephone Encounter (Signed)
 Called [facility] @ [phone] Spoke with Delon  She states pt is having procedure for liver mass done 9/24  The physician is needing pulm clearance   She is unsure what his pulm hx is  I tried calling the pt to get him scheduled and there was no answer- LMTCB

## 2024-01-13 NOTE — Telephone Encounter (Signed)
 Copied from CRM [id number]. Topic: Appointments - Scheduling Inquiry for Clinic >> Jan 13, 2024 10:27 AM Isabell A wrote: Reason for CRM: Delon from [facility] calling to request for the office to contact the patient for scheduling (cardiacpulmonary clearance for MWA procedure asap)    Diagnosis: Cardiopulmonary   Procedures: 9/24 Patient is having a Microwave of the liver procedure at hospital. Dr Hughes is requesting a cardiopulmonary clearance. Needs Urgent.   No call back number provided  This does not appear to be an established pt here, and has no appt pending I asked [email or web address] to provide a good call back number Will route to clearance pool for now

## 2024-01-16 NOTE — Telephone Encounter (Signed)
 Copied from CRM [id number]. Topic: Appointments - Scheduling Inquiry for Clinic >> Jan 13, 2024 10:27 AM Isabell A wrote: Reason for CRM: Delon from [facility] calling to request for the office to contact the patient for scheduling (cardiacpulmonary clearance for MWA procedure asap)    Diagnosis: Cardiopulmonary   Procedures: 9/24 Patient is having a Microwave of the liver procedure at hospital. Dr Hughes is requesting a cardiopulmonary clearance. Needs Urgent. >> Jan 16, 2024  9:49 AM Leila C wrote: Patient states was referred for to see pulmonologist by Dr. Hughes. Unable to reach CAL. Please call back, first call  [phone] and then [phone].    Patient has been scheduled:  Called patient and was able to schedule for 01/19/2024 at 11am with Dr. Neda for clearance prior to procedure on 02/01/2024.   Nothing further needed.

## 2024-01-19 ENCOUNTER — Ambulatory Visit (INDEPENDENT_AMBULATORY_CARE_PROVIDER_SITE_OTHER): Admitting: Pulmonary Disease

## 2024-01-19 VITALS — BP 113/67 | HR 76 | Ht 68.0 in | Wt 225.6 lb

## 2024-01-19 DIAGNOSIS — Z01818 Encounter for other preprocedural examination: Secondary | ICD-10-CM | POA: Diagnosis not present

## 2024-01-19 NOTE — Progress Notes (Signed)
 Thomas [name]    [id number]    1956/04/24  Primary Care Physician:Stowe, Damian, NP  Referring Physician: Hughes Simmonds, MD [address] [place],  [place] [address]  Chief complaint:   Preop evaluation  HPI:  Patient is having ablation of the liver lesion  Biopsy of the lesion did reveal metastatic disease from a colon cancer  Had surgery for the colon cancer couple years ago  Denies any respiratory complaints, no history of lung disease Reformed smoker quit over 38 years ago was smoking about half a pack a day Was in [employer], did a lot of driving in the past as well  Able to walk at least half a mile easily, up and down the steps without limitations  Surgical interventions in the last couple years were tolerated well without complications  He has no wheezing, no ongoing cough  Has a history of a heart murmur otherwise, no history of heart problems  He does have some issues with his cognition  Outpatient Encounter Medications as of 01/19/2024  Medication Sig   acetaminophen  (TYLENOL ) 500 MG tablet Take 2 tablets (1,000 mg total) by mouth every 6 (six) hours.   allopurinol  (ZYLOPRIM ) 100 MG tablet Take 100 mg by mouth at bedtime.   amLODipine  (NORVASC ) 10 MG tablet Take 10 mg by mouth in the morning.   atorvastatin  (LIPITOR) 20 MG tablet Take 20 mg by mouth in the morning.   dorzolamide-timolol (COSOPT) 2-0.5 % ophthalmic solution Place 1 drop into both eyes 2 (two) times daily.   gabapentin  (NEURONTIN ) 100 MG capsule Take 100-200 mg by mouth See admin instructions. Take 1 capsule (100 mg) by mouth in the morning & take 2 capsules (200 mg) by mouth in the evening.   ibuprofen  (ADVIL ) 600 MG tablet Take 1 tablet (600 mg total) by mouth 4 (four) times daily.   levETIRAcetam  (KEPPRA ) 500 MG tablet Take 500 mg by mouth 2 (two) times daily.   LORazepam  (ATIVAN ) 0.5 MG tablet Take 0.5 mg by mouth 2 (two) times daily as needed for anxiety.    omeprazole  (PRILOSEC) 20 MG capsule Take 20 mg by mouth daily before breakfast.   Oxycodone  HCl 10 MG TABS Take 0.5-1 tablets (5-10 mg total) by mouth every 4 (four) hours as needed for moderate pain (pain score 4-6) or severe pain (pain score 7-10) (5mg  for moderate pain, 10mg  for severe pain).   Polyethyl Glycol-Propyl Glycol 0.4-0.3 % SOLN Place 1-2 drops into both eyes 3 (three) times daily as needed (dry/irritated eyes.).   tamsulosin (FLOMAX) 0.4 MG CAPS capsule 0.4 mg daily after supper.   No facility-administered encounter medications on file as of 01/19/2024.    Allergies as of 01/19/2024 - Review Complete 01/19/2024  Allergen Reaction Noted   Morphine  and codeine Other (See Comments) 01/31/2021    Past Medical History:  Diagnosis Date   Acute encephalopathy 04/09/2014   Acute ischemic left MCA stroke (HCC) 04/09/2014   pt states he did not have a stroke, but knows that this is on his med history.   Acute renal failure with acute cortical necrosis (HCC)    Acute respiratory failure with hypoxemia (HCC) 12/07/2020   Acute respiratory failure with hypoxia (HCC) 04/09/2014   Anxiety    Brain tumor (HCC)    frontal   Chronic kidney disease    Colon cancer (HCC)    Confusion    occasionally   Depression    Diabetes  mellitus without complication (HCC)    Dizziness    Duodenal ulcer disease    Epilepsy (HCC)    takes Keppra  daily   GERD (gastroesophageal reflux disease)    takes Omeprazole  daily   Gout    Headache    Heart murmur    Hyperlipidemia    takes Atorvastatin  daily   Hypertension    Meningioma (HCC) 09/01/2016   Peripheral edema    takes Lasix  daily   Peripheral neuropathy    takes Gabapentin  daily   Seizures (HCC)    Sleep apnea    no cpap   Urinary frequency    Viral gastroenteritis 09/29/2021    Past Surgical History:  Procedure Laterality Date   BIOPSY  09/05/2020   Procedure: BIOPSY;  Surgeon: Abran Norleen SAILOR, MD;  Location: [facility] ENDOSCOPY;   Service: Endoscopy;;   BIOPSY  02/06/2021   Procedure: BIOPSY;  Surgeon: Albertus Gordy HERO, MD;  Location: [facility] ENDOSCOPY;  Service: Gastroenterology;;   CARDIAC CATHETERIZATION  7 yrs ago   COLONOSCOPY WITH PROPOFOL  N/A 09/05/2020   Procedure: COLONOSCOPY WITH PROPOFOL ;  Surgeon: Abran Norleen SAILOR, MD;  Location: [facility] ENDOSCOPY;  Service: Endoscopy;  Laterality: N/A;   CRANIOTOMY N/A 09/01/2016   Procedure: CRANIOTOMY TUMOR  LEFT PTERIONAL;  Surgeon: Rockey Peru, MD;  Location: [facility] OR;  Service: Neurosurgery;  Laterality: N/A;   cyst removed from chest      as a child   ESOPHAGOGASTRODUODENOSCOPY (EGD) WITH PROPOFOL  N/A 02/06/2021   Procedure: ESOPHAGOGASTRODUODENOSCOPY (EGD) WITH PROPOFOL ;  Surgeon: Albertus Gordy HERO, MD;  Location: [facility] ENDOSCOPY;  Service: Gastroenterology;  Laterality: N/A;   EYE SURGERY Bilateral 02/08/2023   INGUINAL HERNIA REPAIR N/A 07/01/2022   Procedure: HERNIA REPAIR INGUINAL INCARCERATED;  Surgeon: Sheldon Standing, MD;  Location: [facility] ORS;  Service: General;  Laterality: N/A;   IR FLUORO GUIDE CV LINE LEFT  12/22/2020   IR GASTROSTOMY TUBE MOD SED  02/10/2021   IR GASTROSTOMY TUBE REMOVAL  04/15/2021   IR RADIOLOGIST EVAL & MGMT  12/26/2023   IR REMOVAL TUN CV CATH W/O FL  03/09/2021   IR US  GUIDE VASC ACCESS LEFT  12/22/2020   LAPAROSCOPY  04/29/2023   Procedure: LAPAROSCOPY DIAGNOSTIC;  Surgeon: Paola Dreama SAILOR, MD;  Location: [facility] OR;  Service: General;;   LYSIS OF ADHESION N/A 07/01/2022   Procedure: LYSIS OF ADHESION;  Surgeon: Sheldon Standing, MD;  Location: [facility] ORS;  Service: General;  Laterality: N/A;   NO PAST SURGERIES     PARTIAL COLECTOMY N/A 09/10/2020   Procedure: TRANSVERSE COLECTOMY;  Surgeon: Paola Dreama SAILOR, MD;  Location: [facility] OR;  Service: General;  Laterality: N/A;   POLYPECTOMY  09/05/2020   Procedure: POLYPECTOMY;  Surgeon: Abran Norleen SAILOR, MD;  Location: [facility] ENDOSCOPY;  Service: Endoscopy;;   PORT-A-CATH REMOVAL N/A 10/08/2022   Procedure: REMOVAL PORT-A-CATH;   Surgeon: Paola Dreama SAILOR, MD;  Location: [facility] OR;  Service: General;  Laterality: N/A;  46   PORTACATH PLACEMENT Right 10/01/2020   Procedure: INSERTION PORT-A-CATH;  Surgeon: Paola Dreama SAILOR, MD;  Location: [facility] OR;  Service: General;  Laterality: Right;   SUBMUCOSAL TATTOO INJECTION  09/05/2020   Procedure: SUBMUCOSAL TATTOO INJECTION;  Surgeon: Abran Norleen SAILOR, MD;  Location: [facility] ENDOSCOPY;  Service: Endoscopy;;   TRACHEOSTOMY TUBE PLACEMENT N/A 01/05/2021   Procedure: TRACHEOSTOMY;  Surgeon: Jesus Oliphant, MD;  Location: [facility] OR;  Service: ENT;  Laterality: N/A;   VENTRAL HERNIA REPAIR N/A 04/29/2023   Procedure: VENTRAL HERNIA REPAIR WITH MESH;  Surgeon: Paola Dreama SAILOR, MD;  Location: [facility] OR;  Service: General;  Laterality: N/A;   XI ROBOTIC ASSISTED COLOSTOMY TAKEDOWN N/A 07/01/2022   Procedure: ROBOTIC OSTOMY TAKEDOWN WITH INTRACORPOREAL ANASTOMOSIS WITH FIREFLY;  Surgeon: Sheldon Standing, MD;  Location: [facility] ORS;  Service: General;  Laterality: N/A;    Family History  Problem Relation Age of Onset   Breast cancer Mother    Breast cancer Maternal Aunt    Prostate cancer Maternal Uncle    Colon cancer Neg Hx    Esophageal cancer Neg Hx    Rectal cancer Neg Hx    Stomach cancer Neg Hx     Social History   Socioeconomic History   Marital status: Married    Spouse name: Mahalia   Number of children: 4   Years of education: Not on file   Highest education level: Not on file  Occupational History   Occupation: Disabled  Tobacco Use   Smoking status: Former    Types: Cigarettes   Smokeless tobacco: Never   Tobacco comments:    quit smoking 35 yrs ago  Vaping Use   Vaping status: Never Used  Substance and Sexual Activity   Alcohol  use: No   Drug use: No   Sexual activity: Not on file  Other Topics Concern   Not on file  Social History Narrative   Married to his wife, Abel with total of #4 children (#2 with current wife). Lives in home with his son and daughter-in-law.      Social Drivers of Corporate investment banker Strain: Not on file  Food Insecurity: No Food Insecurity (04/29/2023)   Hunger Vital Sign    Worried About Running Out of Food in the Last Year: Never true    Ran Out of Food in the Last Year: Never true  Transportation Needs: No Transportation Needs (04/29/2023)   PRAPARE - Administrator, Civil Service (Medical): No    Lack of Transportation (Non-Medical): No  Physical Activity: Not on file  Stress: Not on file  Social Connections: Unknown (05/10/2023)   Social Connection and Isolation Panel    Frequency of Communication with Friends and Family: Never    Frequency of Social Gatherings with Friends and Family: Never    Attends Religious Services: Never    Database administrator or Organizations: No    Attends Banker Meetings: Never    Marital Status: Patient declined  Catering manager Violence: Not At Risk (04/29/2023)   Humiliation, Afraid, Rape, and Kick questionnaire    Fear of Current or Ex-Partner: No    Emotionally Abused: No    Physically Abused: No    Sexually Abused: No    Review of Systems  Respiratory:  Negative for cough, shortness of breath and wheezing.     Vitals:   01/19/24 1042  BP: 113/67  Pulse: 76  SpO2: 97%     Physical Exam Constitutional:      Appearance: He is obese.  HENT:     Head: Normocephalic.     Nose: Nose normal.     Mouth/Throat:     Mouth: Mucous membranes are moist.  Eyes:     General: No scleral icterus. Cardiovascular:     Rate and Rhythm: Normal rate and regular rhythm.     Heart sounds: No murmur heard.    No friction rub.  Pulmonary:     Effort: No respiratory distress.     Breath sounds: No stridor. No  wheezing or rhonchi.  Musculoskeletal:     Cervical back: No rigidity or tenderness.  Neurological:     Mental Status: He is alert.  Psychiatric:        Mood and Affect: Mood normal.    Data Reviewed: PET/CT from July 2025 reviewed - CT  scan of the chest does not show any significant infiltrative process  Assessment/Plan: Patient with metastatic colon cancer with metastasis to the liver having an ablation performed  Reformed smoker quit over 38 years ago  In for preop evaluation  He has no significant concerns about his breathing, not limited with any respiratory complaints, functions well  Has no respiratory preclusions to intervention Risk of respiratory complications is low  Cleared for ablation from a respiratory perspective  Encouraged to stay active  Will be glad to see as needed  Jennet Epley MD [facility] 01/19/2024, 11:08 AM  CC: Hughes Simmonds, MD

## 2024-01-19 NOTE — Patient Instructions (Signed)
 You are cleared to have your ablation procedure performed  We do not need any testing  Your risk is low with respect to respiratory complications  I will be glad to see you as needed  We do not need to make any follow-up appointment  My notes will reflect the fact that your risk is low for complications

## 2024-01-23 ENCOUNTER — Inpatient Hospital Stay: Attending: Oncology | Admitting: Oncology

## 2024-01-23 VITALS — BP 139/74 | HR 73 | Temp 98.2°F | Resp 18 | Ht 68.0 in | Wt 224.6 lb

## 2024-01-23 DIAGNOSIS — Z9049 Acquired absence of other specified parts of digestive tract: Secondary | ICD-10-CM | POA: Diagnosis not present

## 2024-01-23 DIAGNOSIS — Z86718 Personal history of other venous thrombosis and embolism: Secondary | ICD-10-CM | POA: Insufficient documentation

## 2024-01-23 DIAGNOSIS — G40909 Epilepsy, unspecified, not intractable, without status epilepticus: Secondary | ICD-10-CM | POA: Insufficient documentation

## 2024-01-23 DIAGNOSIS — Z933 Colostomy status: Secondary | ICD-10-CM | POA: Diagnosis not present

## 2024-01-23 DIAGNOSIS — K4090 Unilateral inguinal hernia, without obstruction or gangrene, not specified as recurrent: Secondary | ICD-10-CM | POA: Diagnosis not present

## 2024-01-23 DIAGNOSIS — Z803 Family history of malignant neoplasm of breast: Secondary | ICD-10-CM | POA: Diagnosis not present

## 2024-01-23 DIAGNOSIS — C184 Malignant neoplasm of transverse colon: Secondary | ICD-10-CM | POA: Diagnosis present

## 2024-01-23 DIAGNOSIS — Z7901 Long term (current) use of anticoagulants: Secondary | ICD-10-CM | POA: Diagnosis not present

## 2024-01-23 DIAGNOSIS — E1122 Type 2 diabetes mellitus with diabetic chronic kidney disease: Secondary | ICD-10-CM | POA: Insufficient documentation

## 2024-01-23 DIAGNOSIS — C787 Secondary malignant neoplasm of liver and intrahepatic bile duct: Secondary | ICD-10-CM | POA: Diagnosis present

## 2024-01-23 DIAGNOSIS — E1140 Type 2 diabetes mellitus with diabetic neuropathy, unspecified: Secondary | ICD-10-CM | POA: Insufficient documentation

## 2024-01-23 DIAGNOSIS — Z8042 Family history of malignant neoplasm of prostate: Secondary | ICD-10-CM | POA: Insufficient documentation

## 2024-01-23 NOTE — Progress Notes (Signed)
 [facility] OFFICE PROGRESS NOTE   Diagnosis: Colon cancer  INTERVAL HISTORY:   Thomas [name] returns as scheduled.  He feels well.  Good appetite.  Right abdominal pain improves after eating.  No new complaint. He underwent a CT-guided biopsy of the right liver mass and fiducial marker placement 01/04/2024.  He reports tolerating the procedure well. The pathology revealed metastatic adenocarcinoma consistent with colorectal adenocarcinoma.  He is scheduled for ablation of the right liver lesion on 02/01/2024. Objective:  Vital signs in last 24 hours:  Blood pressure 139/74, pulse 73, temperature 98.2 F (36.8 C), temperature source Temporal, resp. rate 18, height 5' 8 (1.727 m), weight 224 lb 9.6 oz (101.9 kg), SpO2 100%.  Lymphatics: No cervical, supraclavicular, axillary, or inguinal nodes Resp: Lungs clear bilaterally Cardio: Regular rate and rhythm GI: No hepatosplenomegaly, nontender, no mass Vascular: No leg edema   Lab Results:  Lab Results  Component Value Date   WBC 10.7 (H) 01/04/2024   HGB 13.3 01/04/2024   HCT 39.4 01/04/2024   MCV 77.4 (L) 01/04/2024   PLT 245 01/04/2024   NEUTROABS 7.1 01/04/2024    CMP  Lab Results  Component Value Date   NA 139 01/04/2024   K 4.7 01/04/2024   CL 107 01/04/2024   CO2 20 (L) 01/04/2024   GLUCOSE 94 01/04/2024   BUN 32 (H) 01/04/2024   CREATININE 1.88 (H) 01/04/2024   CALCIUM  9.4 01/04/2024   PROT 7.4 01/04/2024   ALBUMIN  4.0 01/04/2024   AST 27 01/04/2024   ALT 31 01/04/2024   ALKPHOS 95 01/04/2024   BILITOT 0.5 01/04/2024   GFRNONAA 39 (L) 01/04/2024   GFRAA >60 04/01/2017    Lab Results  Component Value Date   CEA1 5.1 (H) 01/04/2024   CEA 2.31 10/31/2023    Medications: I have reviewed the patient's current medications.   Assessment/Plan: Transverse colon cancer, stage IIIb (T3N1a), status post a partial transverse colectomy and end colostomy 09/10/2020 CT abdomen/pelvis 09/04/2020-  possible transverse colon mass with small regional lymph nodes Colonoscopy 09/05/2020- obstructing transverse colon mass-biopsy invasive adenocarcinoma, mass could not be passed CT chest 09/09/2020-no evidence of metastatic disease, 3 x 2 mm subpleural nodule in the right upper lobe likely benign subpleural lymph node Partial transverse colectomy 09/10/2020, transverse colon tumor, no lymphovascular perineural invasion, 1/19 lymph nodes positive, negative margins, MSI stable, no loss of mismatch repair protein expression Cycle 1 CAPOX 10/08/2020, oxaliplatin  dose reduced to 100 mg per metered squared secondary to renal insufficiency Cycle 2 CAPOX 10/29/2020 Cycle 3 CAPOX 11/19/2020, capecitabine  dose reduced secondary to hand/foot syndrome, oxaliplatin  dose reduced secondary to renal failure and poor performance status CT chest/abdomen/pelvis without contrast 12/01/2020-no acute abnormality in the chest, abdomen, or pelvis.  No evidence of obstruction. CTs 03/05/2022-no evidence of recurrent disease CT abdomen/pelvis 07/09/2022 and 07/21/2022-indeterminate segment 7 lesion measuring 1.2 cm CT chest 05/10/2023: New opacity in the right upper lobe-infiltrate?,  Nondependent nodular areas at the lower trachea favor atypical debris MRI abdomen 05/10/2023: 18 x 12 mm cystic lesion in the anterolateral right liver, increased in size from March 2024 05/12/2023: Ultrasound-guided biopsy of right liver lesion, the liver lesion was subtle by ultrasound, benign liver parenchyma -negative for malignancy CT abdomen/pelvis 11/19/2023: Increased size of a 2 cm low-attenuation right liver lesion, small left inguinal hernia MRI liver 11/26/2023: 2 cm lesion in segment 8, progressive from 05/10/2023 MRI PET 12/07/2023: Segment 8 liver lesion with SUV of 5.0, no other areas of abnormal uptake 01/04/2024:  CT/ultrasound-guided biopsy and fiducial marker placement, segment 8 mass, metastatic adenocarcinoma-CK20 and CDX2 positive, CK7  negative Resection of a frontal meningioma 09/01/2016 Diabetes Rectal polyp- tubular adenoma on colonoscopy 09/05/2020 Hypertension Diabetic neuropathy Epilepsy Family history of breast and prostate cancer Admission with acute renal failure 10/01/2020-secondary to lack of oral intake and lisinopril , improved with intravenous hydration and holding lisinopril  Admission 12/01/2020-sepsis, diarrhea HHS, A. fib with RVR, abdominal pain 11.  Respiratory failure requiring intubation 12/06/2020 Tracheostomy 12/18/2020 12.  Progressive renal failure-CRRT started 12/06/2020, discontinued after 12/16/2020, hemodialysis initiated 13.  Thrombocytopenia secondary to chemotherapy and sepsis, heparin -induced platelet antibody and SRA negative-resolved 14.  Right lower extremity DVT diagnosed 12/12/2020-on apixaban   15.  Anemia secondary to chronic disease, renal failure, and phlebotomy 16.  Admission 09/29/2021 with rotavirus 17.  Admission 04/29/2023 for an open ventral hernia repair 18.  Postoperative ileus/small bowel obstruction December 2024 19.  Chronic renal failure/acute renal injury following the ventral hernia repair December 2024        Disposition: Thomas [name] appears stable.  He has been diagnosed with metastatic colon cancer involving an isolated liver lesion.  He does not wish to consider surgery.  He is scheduled for ablation of the liver lesion on 02/01/2024.  He will return for an office visit and CEA approximately 2 months following the procedure.  We will plan for repeat imaging 3 to 4 months after the ablation procedure.    Thomas Hof, MD  01/23/2024  3:12 PM

## 2024-01-23 NOTE — Telephone Encounter (Signed)
 Fax number was not provided and I'm unable to locate a surgical assessment form.  Assessment is mentioned in 01/19/2024 OV note. I have spoken to patient and he provided me with a fax number of [phone]. Assessment has been faxed. Received successful fax confirmation .  Nothing further needed.

## 2024-01-24 NOTE — Addendum Note (Signed)
 Encounter addended by: Crawford Dorothyann POUR on: 01/24/2024 3:21 PM  Actions taken: Imaging Exam ended, Implants edited in Patient Implants

## 2024-01-27 ENCOUNTER — Other Ambulatory Visit: Payer: Self-pay | Admitting: Radiology

## 2024-01-27 DIAGNOSIS — R160 Hepatomegaly, not elsewhere classified: Secondary | ICD-10-CM

## 2024-01-27 NOTE — Progress Notes (Signed)
 Left voicemail on IR P.A. line requesting orders in epic from surgeon.

## 2024-01-29 NOTE — Patient Instructions (Signed)
 SURGICAL WAITING ROOM VISITATION Patients having surgery or a procedure may have no more than 2 support people in the waiting area - these visitors may rotate in the visitor waiting room.   If the patient needs to stay at the hospital during part of their recovery, the visitor guidelines for inpatient rooms apply.  PRE-OP VISITATION  Pre-op nurse will coordinate an appropriate time for 1 support person to accompany the patient in pre-op.  This support person may not rotate.  This visitor will be contacted when the time is appropriate for the visitor to come back in the pre-op area.  Please refer to the [facility] website for the visitor guidelines for Inpatients (after your surgery is over and you are in a regular room).  You are not required to quarantine at this time prior to your surgery. However, you must do this: Hand Hygiene often Do NOT share personal items Notify your provider if you are in close contact with someone who has COVID or you develop fever 100.4 or greater, new onset of sneezing, cough, sore throat, shortness of breath or body aches.  If you test positive for Covid or have been in contact with anyone that has tested positive in the last 10 days please notify you surgeon.    Your procedure is scheduled on:  [facility]  February 01, 2024  Report to [facility] Main Entrance: [address] entrance where the [employer] is available.   Report to admitting at: 06:30    AM  Call this number if you have any questions or problems the morning of surgery [phone]  DO NOT EAT OR DRINK ANYTHING AFTER MIDNIGHT THE NIGHT PRIOR TO YOUR SURGERY / PROCEDURE.   FOLLOW  ANY ADDITIONAL PRE OP INSTRUCTIONS YOU RECEIVED FROM YOUR SURGEON'S OFFICE!!!   Oral Hygiene is also important to reduce your risk of infection.        Remember - BRUSH YOUR TEETH THE MORNING OF SURGERY WITH YOUR REGULAR TOOTHPASTE  Do NOT smoke after Midnight the night before surgery.  STOP TAKING all  Vitamins, Herbs and supplements 1 week before your surgery.   Take ONLY these medicines the morning of surgery with A SIP OF WATER : omeprazole , gabapentin , levetiracetam  (Keppra ), and Tylenol  if needed for pain.  You may use your Eye drops if needed.                   You may not have any metal on your body including  jewelry, and body piercing  Do not wear lotions, powders, cologne, or deodorant  Men may shave face and neck.  Contacts, Hearing Aids, dentures or bridgework may not be worn into surgery. DENTURES WILL BE REMOVED PRIOR TO SURGERY PLEASE DO NOT APPLY Poly grip OR ADHESIVES!!!  You may bring a small overnight bag with you on the day of surgery, only pack items that are not valuable. [facility] IS NOT RESPONSIBLE   FOR VALUABLES THAT ARE LOST OR STOLEN.   Do not bring your home medications to the hospital. The Pharmacy will dispense medications listed on your medication list to you during your admission in the Hospital.  Please read over the following fact sheets you were given: IF YOU HAVE QUESTIONS ABOUT YOUR PRE-OP INSTRUCTIONS, PLEASE CALL [phone].   [facility] - Preparing for Surgery Before surgery, you can play an important role.  Because skin is not sterile, your skin needs to be as free of germs as possible.  You can reduce the  number of germs on your skin by washing with CHG (chlorahexidine gluconate) soap before surgery.  CHG is an antiseptic cleaner which kills germs and bonds with the skin to continue killing germs even after washing. Please DO NOT use if you have an allergy to CHG or antibacterial soaps.  If your skin becomes reddened/irritated stop using the CHG and inform your nurse when you arrive at Short Stay. Do not shave (including legs and underarms) for at least 48 hours prior to the first CHG shower.  You may shave your face/neck.  Please follow these instructions carefully:  1.  Shower with CHG Soap the night before surgery and the  morning of  surgery.  2.  If you choose to wash your hair, wash your hair first as usual with your normal  shampoo.  3.  After you shampoo, rinse your hair and body thoroughly to remove the shampoo.                             4.  Use CHG as you would any other liquid soap.  You can apply chg directly to the skin and wash.  Gently with a scrungie or clean washcloth.  5.  Apply the CHG Soap to your body ONLY FROM THE NECK DOWN.   Do not use on face/ open                           Wound or open sores. Avoid contact with eyes, ears mouth and genitals (private parts).                       Wash face,  Genitals (private parts) with your normal soap.             6.  Wash thoroughly, paying special attention to the area where your  surgery  will be performed.  7.  Thoroughly rinse your body with warm water  from the neck down.  8.  DO NOT shower/wash with your normal soap after using and rinsing off the CHG Soap.            9.  Pat yourself dry with a clean towel.            10.  Wear clean pajamas.            11.  Place clean sheets on your bed the night of your first shower and do not  sleep with pets.  ON THE DAY OF SURGERY : Do not apply any lotions/deodorants the morning of surgery.  Please wear clean clothes to the hospital/surgery center.    FAILURE TO FOLLOW THESE INSTRUCTIONS MAY RESULT IN THE CANCELLATION OF YOUR SURGERY  PATIENT SIGNATURE_________________________________  NURSE SIGNATURE__________________________________  ________________________________________________________________________

## 2024-01-29 NOTE — Progress Notes (Signed)
 COVID Vaccine received:  []  No [x]  Yes Date of any COVID positive Test in last 90 days: None  PCP - Damian Christians, NP at [facility] Cardiologist - none now ( saw Victory Sharps, MD in 2010 when hospitalized for colon cancer)  Oncology- [name], MD  Pulmonology- Jennet Epley, MD   Chest x-ray - CT chest 05-10-2023 EKG - 07-08-2022   MD ordered repeat Stress Test -  ECHO - 07-16-2022  Epic Cardiac Cath -  CT Coronary Calcium  score:   Bowel Prep - []  No  []   Yes ______  Pacemaker / ICD device [x]  No []  Yes   Spinal Cord Stimulator:[x]  No []  Yes       History of Sleep Apnea? []  No [x]  Yes   CPAP used?- [x]  No []  Yes    Patient has: []  NO Hx DM   [x]  Pre-DM   []  DM1  []   DM2 Does the patient monitor blood sugar?   []  N/A   [x]  No []  Yes  Last A1c was: 5.6  on 04-26-2023    No meds   Blood Thinner / Instructions:  none Aspirin  Instructions:  none  Dental hx: []  Dentures:  []  N/A      []  Bridge or Partial:                   [x]  Loose or Damaged teeth:   Activity level: Able to walk up 2 flights of stairs without becoming significantly short of breath or having chest pain?  []  No   [x]    Yes  Patient can perform ADLs without assistance. []  No   [x]   Yes  Anesthesia review: Epilepsy (last seizure: ? 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  Patient verbalized understanding and agreement to the Pre-Surgical Instructions that were given to them at this PAT appointment. Patient was also educated of the need to review these PAT instructions again prior to his surgery.I reviewed the appropriate phone numbers to call if they have any and questions or concerns.

## 2024-01-30 ENCOUNTER — Other Ambulatory Visit: Payer: Self-pay

## 2024-01-30 ENCOUNTER — Other Ambulatory Visit: Payer: Self-pay | Admitting: Radiology

## 2024-01-30 DIAGNOSIS — I739 Peripheral vascular disease, unspecified: Secondary | ICD-10-CM

## 2024-01-30 NOTE — Addendum Note (Signed)
 Encounter addended by: Crawford Dorothyann POUR on: 01/30/2024 3:04 PM  Actions taken: Imaging Exam ended, Charge Capture section accepted

## 2024-01-31 ENCOUNTER — Other Ambulatory Visit: Payer: Self-pay

## 2024-01-31 ENCOUNTER — Encounter (HOSPITAL_COMMUNITY): Payer: Self-pay

## 2024-01-31 ENCOUNTER — Ambulatory Visit (HOSPITAL_COMMUNITY)
Admission: RE | Admit: 2024-01-31 | Discharge: 2024-01-31 | Disposition: A | Discharge status: Home / Self Care | Source: Ambulatory Visit | Attending: Radiology | Admitting: Radiology

## 2024-01-31 ENCOUNTER — Encounter (HOSPITAL_COMMUNITY)
Admission: RE | Admit: 2024-01-31 | Discharge: 2024-01-31 | Disposition: A | Discharge status: Home / Self Care | Source: Ambulatory Visit | Attending: Interventional Radiology

## 2024-01-31 DIAGNOSIS — R160 Hepatomegaly, not elsewhere classified: Secondary | ICD-10-CM | POA: Insufficient documentation

## 2024-01-31 HISTORY — DX: Bicuspid aortic valve: Q23.81

## 2024-01-31 HISTORY — DX: Unspecified osteoarthritis, unspecified site: M19.90

## 2024-01-31 LAB — CBC WITH DIFFERENTIAL/PLATELET
Abs Immature Granulocytes: 0.02 K/uL (ref 0.00–0.07)
Basophils Absolute: 0.1 K/uL (ref 0.0–0.1)
Basophils Relative: 1 %
Eosinophils Absolute: 0.4 K/uL (ref 0.0–0.5)
Eosinophils Relative: 3 %
HCT: 40.4 % (ref 39.0–52.0)
Hemoglobin: 13.4 g/dL (ref 13.0–17.0)
Immature Granulocytes: 0 %
Lymphocytes Relative: 20 %
Lymphs Abs: 2.3 K/uL (ref 0.7–4.0)
MCH: 25.7 pg — ABNORMAL LOW (ref 26.0–34.0)
MCHC: 33.2 g/dL (ref 30.0–36.0)
MCV: 77.4 fL — ABNORMAL LOW (ref 80.0–100.0)
Monocytes Absolute: 0.8 K/uL (ref 0.1–1.0)
Monocytes Relative: 7 %
Neutro Abs: 8 K/uL — ABNORMAL HIGH (ref 1.7–7.7)
Neutrophils Relative %: 69 %
Platelets: 253 K/uL (ref 150–400)
RBC: 5.22 MIL/uL (ref 4.22–5.81)
RDW: 14.6 % (ref 11.5–15.5)
WBC: 11.7 K/uL — ABNORMAL HIGH (ref 4.0–10.5)
nRBC: 0 % (ref 0.0–0.2)

## 2024-01-31 LAB — COMPREHENSIVE METABOLIC PANEL WITH GFR
ALT: 27 U/L (ref 0–44)
AST: 21 U/L (ref 15–41)
Albumin: 4 g/dL (ref 3.5–5.0)
Alkaline Phosphatase: 96 U/L (ref 38–126)
Anion gap: 10 (ref 5–15)
BUN: 40 mg/dL — ABNORMAL HIGH (ref 8–23)
CO2: 21 mmol/L — ABNORMAL LOW (ref 22–32)
Calcium: 9.4 mg/dL (ref 8.9–10.3)
Chloride: 108 mmol/L (ref 98–111)
Creatinine, Ser: 1.98 mg/dL — ABNORMAL HIGH (ref 0.61–1.24)
GFR, Estimated: 36 mL/min — ABNORMAL LOW (ref >60)
Glucose, Bld: 98 mg/dL (ref 70–99)
Potassium: 4.8 mmol/L (ref 3.5–5.1)
Sodium: 139 mmol/L (ref 135–145)
Total Bilirubin: 0.6 mg/dL (ref 0.0–1.2)
Total Protein: 7.5 g/dL (ref 6.5–8.1)

## 2024-01-31 LAB — PROTIME-INR
INR: 1 (ref 0.8–1.2)
Prothrombin Time: 13.8 s (ref 11.4–15.2)

## 2024-01-31 NOTE — H&P (Signed)
 Chief Complaint: Metastatic colon cancer to liver; referred for image guided microwave ablation of liver tumor  Referring Provider(s): Sherrill,B  Supervising Physician: Hughes Simmonds  Patient Status: [facility] - Out-pt  History of Present Illness: Thomas [name] is a 68 y.o. 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  Recent follow-up imaging has revealed an enlarging right liver lesion which is hypermetabolic on PET and concerning for isolated metastasis.  He is status post nondiagnostic liver mass biopsy in January of this year.  Patient was seen in consultation by Dr. Hughes on 12/26/23 to discuss treatment options for this liver mass.  He recommended a staged approach of diagnosis and treatment beginning with repeat biopsy of the liver lesion with fiducial marker placement to assess for feasibility of percutaneous ablation as an option. On 01/04/24 patient underwent CT assisted liver mass biopsy and fiducial marker placement with pathology revealing metastatic colon cancer.  Patient presents again today for CT-guided microwave ablation of the liver tumor.    *** Patient is Full Code  Past Medical History:  Diagnosis Date   Acute encephalopathy 04/09/2014   Acute ischemic left MCA stroke (HCC) 04/09/2014   pt states he did not have a stroke, but knows that this is on his med history.   Acute renal failure with acute cortical necrosis    Acute respiratory failure with hypoxemia (HCC) 12/07/2020   Anxiety    Aortic stenosis due to bicuspid aortic valve    Arthritis    Brain tumor (HCC)     frontal   Chronic kidney disease    Colon cancer (HCC)    Confusion    occasionally   Depression    Diabetes mellitus without complication (HCC)    Dizziness    Duodenal ulcer disease    Epilepsy (HCC)    takes Keppra  daily   GERD (gastroesophageal reflux disease)    takes Omeprazole  daily   Gout    Headache    Heart murmur    Hyperlipidemia    takes Atorvastatin  daily   Hypertension    Meningioma (HCC) 09/01/2016   Peripheral edema    takes Lasix  daily   Peripheral neuropathy    takes Gabapentin  daily   Sleep apnea    no cpap   Urinary frequency     Past Surgical History:  Procedure Laterality Date   BIOPSY  09/05/2020   Procedure: BIOPSY;  Surgeon: Abran Norleen SAILOR, MD;  Location: [facility] ENDOSCOPY;  Service: Endoscopy;;   BIOPSY  02/06/2021   Procedure: BIOPSY;  Surgeon: Albertus Gordy HERO, MD;  Location: [facility] ENDOSCOPY;  Service: Gastroenterology;;   CARDIAC CATHETERIZATION  7 yrs ago   COLONOSCOPY WITH PROPOFOL  N/A 09/05/2020   Procedure: COLONOSCOPY WITH PROPOFOL ;  Surgeon: Abran Norleen SAILOR, MD;  Location: [facility] ENDOSCOPY;  Service: Endoscopy;  Laterality: N/A;   CRANIOTOMY N/A 09/01/2016   Procedure: CRANIOTOMY TUMOR  LEFT PTERIONAL;  Surgeon: Rockey Peru, MD;  Location: [facility] OR;  Service: Neurosurgery;  Laterality: N/A;   cyst removed from chest      as a child   ESOPHAGOGASTRODUODENOSCOPY (EGD) WITH PROPOFOL  N/A 02/06/2021   Procedure: ESOPHAGOGASTRODUODENOSCOPY (EGD) WITH PROPOFOL ;  Surgeon: Albertus,  Gordy HERO, MD;  Location: [facility] ENDOSCOPY;  Service: Gastroenterology;  Laterality: N/A;   EYE SURGERY Bilateral 02/08/2023   INGUINAL HERNIA REPAIR N/A 07/01/2022   Procedure: HERNIA REPAIR INGUINAL INCARCERATED;  Surgeon: Sheldon Standing, MD;  Location: [facility] ORS;  Service: General;  Laterality: N/A;   IR FLUORO GUIDE CV LINE LEFT  12/22/2020   IR GASTROSTOMY TUBE MOD SED  02/10/2021   IR GASTROSTOMY TUBE REMOVAL  04/15/2021   IR RADIOLOGIST EVAL & MGMT  12/26/2023   IR REMOVAL TUN CV CATH W/O  FL  03/09/2021   IR US  GUIDE VASC ACCESS LEFT  12/22/2020   LAPAROSCOPY  04/29/2023   Procedure: LAPAROSCOPY DIAGNOSTIC;  Surgeon: Paola Dreama SAILOR, MD;  Location: [facility] OR;  Service: General;;   LYSIS OF ADHESION N/A 07/01/2022   Procedure: LYSIS OF ADHESION;  Surgeon: Sheldon Standing, MD;  Location: [facility] ORS;  Service: General;  Laterality: N/A;   NO PAST SURGERIES     PARTIAL COLECTOMY N/A 09/10/2020   Procedure: TRANSVERSE COLECTOMY;  Surgeon: Paola Dreama SAILOR, MD;  Location: [facility] OR;  Service: General;  Laterality: N/A;   POLYPECTOMY  09/05/2020   Procedure: POLYPECTOMY;  Surgeon: Abran Norleen SAILOR, MD;  Location: [facility] ENDOSCOPY;  Service: Endoscopy;;   PORT-A-CATH REMOVAL N/A 10/08/2022   Procedure: REMOVAL PORT-A-CATH;  Surgeon: Paola Dreama SAILOR, MD;  Location: [facility] OR;  Service: General;  Laterality: N/A;  45   PORTACATH PLACEMENT Right 10/01/2020   Procedure: INSERTION PORT-A-CATH;  Surgeon: Paola Dreama SAILOR, MD;  Location: [facility] OR;  Service: General;  Laterality: Right;   SUBMUCOSAL TATTOO INJECTION  09/05/2020   Procedure: SUBMUCOSAL TATTOO INJECTION;  Surgeon: Abran Norleen SAILOR, MD;  Location: [facility] ENDOSCOPY;  Service: Endoscopy;;   TRACHEOSTOMY TUBE PLACEMENT N/A 01/05/2021   Procedure: TRACHEOSTOMY;  Surgeon: Jesus Oliphant, MD;  Location: [facility] OR;  Service: ENT;  Laterality: N/A;   VENTRAL HERNIA REPAIR N/A 04/29/2023   Procedure: VENTRAL HERNIA REPAIR WITH MESH;  Surgeon: Paola Dreama SAILOR, MD;  Location: [facility] OR;  Service: General;  Laterality: N/A;   XI ROBOTIC ASSISTED COLOSTOMY TAKEDOWN N/A 07/01/2022   Procedure: ROBOTIC OSTOMY TAKEDOWN WITH INTRACORPOREAL ANASTOMOSIS WITH FIREFLY;  Surgeon: Sheldon Standing, MD;  Location: [facility] ORS;  Service: General;  Laterality: N/A;    Allergies: Morphine  and codeine  Medications: Prior to Admission medications   Medication Sig Start Date End Date Taking? Authorizing Provider  acetaminophen  (TYLENOL ) 500 MG tablet Take 2 tablets (1,000 mg total) by mouth every 6  (six) hours. 10/08/22  Yes Paola Dreama SAILOR, MD  allopurinol  (ZYLOPRIM ) 100 MG tablet Take 100 mg by mouth at bedtime. 02/15/22  Yes [provider]  amLODipine  (NORVASC ) 10 MG tablet Take 10 mg by mouth at bedtime. 05/11/20  Yes [provider]  atorvastatin  (LIPITOR) 20 MG tablet Take 20 mg by mouth in the morning.   Yes [provider]  dorzolamide-timolol (COSOPT) 2-0.5 % ophthalmic solution Place 1 drop into both eyes 2 (two) times daily. 10/30/23  Yes [provider]  gabapentin  (NEURONTIN ) 100 MG capsule Take 100-200 mg by mouth See admin instructions. Take 1 capsule (100 mg) by mouth in the morning & take 2 capsules (200 mg) by mouth in the evening.   Yes [provider]  ibuprofen  (ADVIL ) 800 MG tablet Take 800 mg by mouth daily as needed (pain.).   Yes [provider]  levETIRAcetam  (KEPPRA ) 500 MG tablet Take 500 mg by mouth 2 (two) times daily. 08/27/21  Yes [provider]  omeprazole  (PRILOSEC) 20 MG capsule Take 20 mg by mouth in the morning and at bedtime.   Yes [provider]  Polyethyl Glycol-Propyl Glycol 0.4-0.3 % SOLN Place 1-2 drops into both eyes 3 (three) times daily as needed (dry/irritated eyes.).   Yes [provider]  polyethylene glycol (MIRALAX  / GLYCOLAX ) 17 g packet Take 17 g by mouth daily as needed (constipation.).   Yes [provider]  tamsulosin (FLOMAX) 0.4 MG CAPS capsule Take 0.4 mg by mouth daily in the afternoon. 09/15/23  Yes [provider]     Family History  Problem Relation Age of Onset   Breast cancer Mother    Breast cancer Maternal Aunt    Prostate cancer Maternal Uncle    Colon cancer Neg Hx    Esophageal cancer Neg Hx    Rectal cancer Neg Hx    Stomach cancer Neg Hx     Social History   Socioeconomic History   Marital status: Married    Spouse name: Clinical biochemist   Number of children: 4   Years of education: Not on file   Highest education level:  Not on file  Occupational History   Occupation: Disabled  Tobacco Use   Smoking status: Former    Types: Cigarettes   Smokeless tobacco: Never   Tobacco comments:    quit smoking 35 yrs ago  Vaping Use   Vaping status: Never Used  Substance and Sexual Activity   Alcohol  use: No   Drug use: No   Sexual activity: Yes  Other Topics Concern   Not on file  Social History Narrative   Married to his wife, Abel with total of #4 children (#2 with current wife). Lives in home with his son and daughter-in-law.     Social Drivers of Corporate investment banker Strain: Not on file  Food Insecurity: No Food Insecurity (04/29/2023)   Hunger Vital Sign    Worried About Running Out of Food in the Last Year: Never true    Ran Out of Food in the Last Year: Never true  Transportation Needs: No Transportation Needs (04/29/2023)   PRAPARE - Administrator, Civil Service (Medical): No    Lack of Transportation (Non-Medical): No  Physical Activity: Not on file  Stress: Not on file  Social Connections: Unknown (05/10/2023)   Social Connection and Isolation Panel    Frequency of Communication with Friends and Family: Never    Frequency of Social Gatherings with Friends and Family: Never    Attends Religious Services: Never    Database administrator or Organizations: No    Attends Banker Meetings: Never    Marital Status: Patient declined       Review of Systems  Vital Signs:   Advance Care Plan: no documents on file  Physical Exam  Imaging: DG Chest 1 View Result Date: 01/31/2024 CLINICAL DATA:  [id number] Preop examination [id number] Liver ablation patient. , heart murmur. Medicated hypertension, ex-smoker. EXAM: CHEST  1 VIEW COMPARISON:  Chest XR, 10/08/2022.  CT chest, 05/10/2023. FINDINGS: Cardiomediastinal silhouette is within normal limits. Lungs are well inflated. No focal consolidation or mass. No pleural effusion or pneumothorax. Surgical clips of the  RIGHT upper quadrant. No acute displaced fracture. IMPRESSION: No acute cardiopulmonary process. Electronically Signed   By: Thom Hall M.D.   On: 01/31/2024 15:53   CT GUIDED NEEDLE PLACEMENT Result Date: 01/05/2024 INDICATION: mass; FIDUCIAL MARKERS Briefly, 68 year old male with a history  of colon cancer with suspected hepatic metastasis. Previous nondiagnostic liver mass biopsy (05/12/2023). EXAM: CT BIOPSY; CT GUIDANCE NEEDLE PLACEMENT Procedures; ULTRASOUND and CT-GUIDED LIVER MASS BIOPSY and FIDUCIAL MARKER PLACEMENT COMPARISON:  PET-CT, 12/07/2023. MRI abdomen, 11/26/2018. CT AP, 11/19/2023. MEDICATIONS: None. ANESTHESIA/SEDATION: Moderate (conscious) sedation was employed during this procedure. A total of Versed  2 mg and Fentanyl  100 mcg was administered intravenously. Moderate Sedation Time: 44 minutes. The patient's level of consciousness and vital signs were monitored continuously by radiology nursing throughout the procedure under my direct supervision. CONTRAST:  None. COMPLICATIONS: None immediate. PROCEDURE: RADIATION DOSE REDUCTION: This exam was performed according to the departmental dose-optimization program which includes automated exposure control, adjustment of the mA and/or kV according to patient size and/or use of iterative reconstruction technique. Informed consent was obtained from the patient following an explanation of the procedure, risks, benefits and alternatives. A time out was performed prior to the initiation of the procedure. The patient was positioned supine on the CT table and a limited CT was performed for procedural planning demonstratingan indistinct, small anterior superior RIGHT hepatic lobe mass. Sonographic evaluation of the mass were also performed, with the acquired images sent to PACS. The procedure was planned. The operative site was prepped and draped in the usual sterile fashion. Appropriate trajectory was confirmed with a 22 gauge spinal needle after the  adjacent tissues were anesthetized with 1% lidocaine . Under direct sonographic guidance, and using intermittent CT scanning, a 15-cm, 21-G access needle was inserted towards the mass lesion under CT guidance. The tip of the needle was initially positioned proximal and superior to the tumor. At this position, a [employer] 2 mm x 5 mm coil as a fiducial marker was successfully placed. The needle was repositioned and additional markers were placed anterior and inferior to the mass. A total of three (3) fiducial markers were identified and confirmed properly positioned on the completion CT images. The needle was removed and superficial hemostasis was obtained by manual compression. A limited postprocedural CT was negative for hemorrhage or additional complication. A dressing was placed. The patient tolerated the procedure well without immediate postprocedural complication. FINDINGS: *Small, indistinct anterior superior RIGHT hepatic lobe (segment VIII) mass measuring up to 2.0 cm. *Biopsy and fiducial marker placement, with a total of three (3) markers were deployed, located anterior superior and anterior to the lesion. IMPRESSION: Successful ultrasound-guided, CT-assisted liver mass biopsy and fiducial marker placement, positioned as above. Thom Hall, MD [facility] Radiology Electronically Signed   By: Thom Hall M.D.   On: 01/05/2024 12:05   CT LIVER MASS BIOPSY Result Date: 01/05/2024 INDICATION: mass; FIDUCIAL MARKERS Briefly, 68 year old male with a history of colon cancer with suspected hepatic metastasis. Previous nondiagnostic liver mass biopsy (05/12/2023). EXAM: CT BIOPSY; CT GUIDANCE NEEDLE PLACEMENT Procedures; ULTRASOUND and CT-GUIDED LIVER MASS BIOPSY and FIDUCIAL MARKER PLACEMENT COMPARISON:  PET-CT, 12/07/2023. MRI abdomen, 11/26/2018. CT AP, 11/19/2023. MEDICATIONS: None. ANESTHESIA/SEDATION: Moderate (conscious) sedation was employed  during this procedure. A total of Versed  2 mg and Fentanyl  100 mcg was administered intravenously. Moderate Sedation Time: 44 minutes. The patient's level of consciousness and vital signs were monitored continuously by radiology nursing throughout the procedure under my direct supervision. CONTRAST:  None. COMPLICATIONS: None immediate. PROCEDURE: RADIATION DOSE REDUCTION: This exam was performed according to the departmental dose-optimization program which includes automated exposure control, adjustment of the mA and/or kV according to patient size and/or use of iterative reconstruction technique. Informed consent was obtained from the patient  following an explanation of the procedure, risks, benefits and alternatives. A time out was performed prior to the initiation of the procedure. The patient was positioned supine on the CT table and a limited CT was performed for procedural planning demonstratingan indistinct, small anterior superior RIGHT hepatic lobe mass. Sonographic evaluation of the mass were also performed, with the acquired images sent to PACS. The procedure was planned. The operative site was prepped and draped in the usual sterile fashion. Appropriate trajectory was confirmed with a 22 gauge spinal needle after the adjacent tissues were anesthetized with 1% lidocaine . Under direct sonographic guidance, and using intermittent CT scanning, a 15-cm, 21-G access needle was inserted towards the mass lesion under CT guidance. The tip of the needle was initially positioned proximal and superior to the tumor. At this position, a [employer] 2 mm x 5 mm coil as a fiducial marker was successfully placed. The needle was repositioned and additional markers were placed anterior and inferior to the mass. A total of three (3) fiducial markers were identified and confirmed properly positioned on the completion CT images. The needle was removed and superficial hemostasis was obtained by manual compression. A  limited postprocedural CT was negative for hemorrhage or additional complication. A dressing was placed. The patient tolerated the procedure well without immediate postprocedural complication. FINDINGS: *Small, indistinct anterior superior RIGHT hepatic lobe (segment VIII) mass measuring up to 2.0 cm. *Biopsy and fiducial marker placement, with a total of three (3) markers were deployed, located anterior superior and anterior to the lesion. IMPRESSION: Successful ultrasound-guided, CT-assisted liver mass biopsy and fiducial marker placement, positioned as above. Thom Hall, MD [facility] Radiology Electronically Signed   By: Thom Hall M.D.   On: 01/05/2024 12:05    Labs:  CBC: Recent Labs    05/18/23 0401 11/19/23 1011 01/04/24 0856 01/31/24 1140  WBC 21.7* 11.0* 10.7* 11.7*  HGB 10.2* 12.8* 13.3 13.4  HCT 28.9* 36.5* 39.4 40.4  PLT 556* 264 245 253    COAGS: Recent Labs    05/12/23 0358 01/04/24 0856 01/31/24 1140  INR 1.1 1.0 1.0    BMP: Recent Labs    10/31/23 0846 11/19/23 1011 01/04/24 0856 01/31/24 1140  NA 138 139 139 139  K 4.5 4.4 4.7 4.8  CL 105 107 107 108  CO2 22 20* 20* 21*  GLUCOSE 106* 129* 94 98  BUN 30* 38* 32* 40*  CALCIUM  9.4 9.0 9.4 9.4  CREATININE 1.86* 1.82* 1.88* 1.98*  GFRNONAA 39* 40* 39* 36*    LIVER FUNCTION TESTS: Recent Labs    05/16/23 0337 11/19/23 1011 01/04/24 0856 01/31/24 1140  BILITOT 0.8 0.5 0.5 0.6  AST 13* 26 27 21   ALT 23 47* 31 27  ALKPHOS 61 96 95 96  PROT 6.9 7.3 7.4 7.5  ALBUMIN  2.9* 3.9 4.0 4.0    TUMOR MARKERS: Recent Labs    10/31/23 0846  CEA 2.31    Assessment and Plan: 68 y.o. male with past medical history significant for left MCA CVA 2015, anxiety, resection of frontal meningioma 2018, diabetes, rectal polyp, hypertension, epilepsy, prior respiratory failure in 2022 with tracheostomy, right lower extremity DVT 2022, anemia, chronic renal failure  and transverse colon cancer with prior partial transverse colectomy and end colostomy in 2022 followed by ostomy takedown in February 2024/subsequent mesh repair of hernia.  Recent follow-up imaging has revealed an enlarging right liver lesion which is hypermetabolic on PET and concerning for isolated metastasis.  He is status post nondiagnostic liver mass biopsy in January of this year.  Patient was seen in consultation by Dr. Hughes on 12/26/23 to discuss treatment options for this liver mass.  He recommended a staged approach of diagnosis and treatment beginning with repeat biopsy of the liver lesion with fiducial marker placement to assess for feasibility of percutaneous ablation as an option. On 01/04/24 patient underwent CT assisted liver mass biopsy and fiducial marker placement with pathology revealing metastatic colon cancer.  Patient presents again today for CT-guided microwave ablation of the liver tumor.  Details/risks of procedure, including but not limited to, internal bleeding, infection, injury to adjacent structures, anesthesia related complications discussed with patient with his understanding and consent.   Thank you for allowing our service to participate in RYAN OGBORN 's care.  Electronically Signed: D. Franky Rakers, PA-C   01/31/2024, 4:46 PM      I spent a total of  25 minutes   in face to face in clinical consultation, greater than 50% of which was counseling/coordinating care for image guided microwave ablation of liver tumor

## 2024-01-31 NOTE — Anesthesia Preprocedure Evaluation (Signed)
 Anesthesia Evaluation  Patient identified by MRN, date of birth, ID band Patient awake    Reviewed: Allergy & Precautions, NPO status , Patient's Chart, lab work & pertinent test results, reviewed documented beta blocker date and time   History of Anesthesia Complications (+) DIFFICULT AIRWAY and history of anesthetic complications (subsequently intubated via DL x2)  Airway Mallampati: IV  TM Distance: >3 FB Neck ROM: Limited    Dental  (+) Missing, Loose   Pulmonary sleep apnea , neg COPD, former smoker   breath sounds clear to auscultation       Cardiovascular hypertension, (-) angina (-) CAD and (-) Past MI + Valvular Problems/Murmurs AS  Rhythm:Regular Rate:Normal + Systolic murmurs IMPRESSIONS     1. Left ventricular ejection fraction, by estimation, is 60 to 65%. The  left ventricle has normal function. The left ventricle has no regional  wall motion abnormalities. There is mild concentric left ventricular  hypertrophy. Left ventricular diastolic  parameters are consistent with Grade I diastolic dysfunction (impaired  relaxation).   2. Right ventricular systolic function is normal. The right ventricular  size is normal. Tricuspid regurgitation signal is inadequate for assessing  PA pressure.   3. The mitral valve is normal in structure. No evidence of mitral valve  regurgitation. No evidence of mitral stenosis.   4. The aortic valve is bicuspid. There is moderate calcification of the  aortic valve. Aortic valve regurgitation is mild. Mild to moderate aortic  valve stenosis. Aortic valve mean gradient measures 20.0 mmHg, AVA 1.65  cm^2.   5. IVC was not visualized.     Neuro/Psych  Headaches, Seizures -, Well Controlled,  PSYCHIATRIC DISORDERS Anxiety Depression     Neuromuscular disease CVA, No Residual Symptoms    GI/Hepatic PUD,GERD  Medicated,,(+) neg Cirrhosis        Endo/Other  diabetes     Renal/GU CRFRenal disease     Musculoskeletal  (+) Arthritis , Osteoarthritis,    Abdominal   Peds  Hematology  (+) Blood dyscrasia, anemia   Anesthesia Other Findings   Reproductive/Obstetrics                              Anesthesia Physical Anesthesia Plan  ASA: 3  Anesthesia Plan: General   Post-op Pain Management:    Induction: Intravenous  PONV Risk Score and Plan: 2 and Ondansetron  and Dexamethasone   Airway Management Planned: Video Laryngoscope Planned and Oral ETT  Additional Equipment:   Intra-op Plan:   Post-operative Plan: Extubation in OR  Informed Consent: I have reviewed the patients History and Physical, chart, labs and discussed the procedure including the risks, benefits and alternatives for the proposed anesthesia with the patient or authorized representative who has indicated his/her understanding and acceptance.     Dental advisory given  Plan Discussed with: CRNA  Anesthesia Plan Comments: (See PAT note from 9/23 )         Anesthesia Quick Evaluation

## 2024-01-31 NOTE — Progress Notes (Signed)
 Case: [id number] Date/Time: 02/01/24 0830   Procedure: RADIOLOGY WITH ANESTHESIA - CT MICROWAVE ABLATION OF LIVER   Anesthesia type: General   Diagnosis: Liver mass [R16.0]   Pre-op diagnosis: Liver mass   [facility]   Surgeons: Hughes Simmonds, MD       DISCUSSION: Thomas [name] is a 68 yo male with PMH of former smoking, mild-moderate aortic stenosis, OSA (no CPAP), hx of meningioma s/p resection (2018), epilepsy, hx of CVA (by imaging), headaches, GERD, PUD, prediabetes, peripheral neuropathy, CKD3, colon cancer s/p partial transverse colectomy and end colostomy 09/2020, s/p chemo, s/p ostomy takedown (06/2022), anxiety, depression.  Reported history of difficult intubation, GlideScope was used electively for ostomy takedown 06/29/2022. DL done during [facility] in 87/7975 without issue.  Hx of stage 3 colon cancer followed by Oncology. He is s/p partial transverse colectomy and end colostomy 09/10/2020 followed by chemotherapy.  He had a prolonged admission July through November 2022 relating to capecitabine  toxicity.  He required dialysis for acute renal failure.  He developed pneumonia and required intubation and ultimately required tracheostomy which was decannulated 03/09/2021.  He also had an episode of A-fib with RVR during admission, echo showed normal LVEF with grade 1 DD.   Patient had ostomy takedown in March 2024. Echo was done during admission for evaluation of murmur; showed LVEF 60 to 65%, grade 1 DD, normal RV systolic function, bicuspid aortic valve with mild to moderate aortic stenosis with mean gradient 20 mmHg. Also noted to have enlarging liver lesion and underwent biopsy which was negative for metastatic disease. 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 Now scheduled for surgery above.   Seen by Pulmonology for clearance on 01/19/24. Stable at that visit and cleared: In for preop evaluation  He has no significant concerns about his breathing, not  limited with any respiratory complaints, functions well Has no respiratory preclusions to intervention Risk of respiratory complications is low Cleared for ablation from a respiratory perspective  Hx of Epilepsy and takes Keppra . There is hx of L MCA CVA in 2015 during admission for AMS however MRI brain showed meningioma and he subsequently had resection in 2018 for this. MRI brain in 2022 shows chronic cerebellar infarct.  Discussed with Dr. Tilford due to no Cardiology f/u. Ok to proceed if patient has adequate functional status with no symptoms. Pt reports in pre op he is able to do a flight of stairs without symptoms. Anticipate he is ok to proceed.  VS: BP 119/69 Comment: right arm sitting  Pulse 68   Temp 36.9 C (Oral)   Resp 16   Ht 5' 8 (1.727 m)   Wt 101.6 kg   SpO2 100%   BMI 34.06 kg/m   PROVIDERS: [name], NP   LABS: Labs reviewed: Acceptable for surgery. (all labs ordered are listed, but only abnormal results are displayed)  Labs Reviewed  CBC WITH DIFFERENTIAL/PLATELET - Abnormal; Notable for the following components:      Result Value   WBC 11.7 (*)    MCV 77.4 (*)    MCH 25.7 (*)    Neutro Abs 8.0 (*)    All other components within normal limits  COMPREHENSIVE METABOLIC PANEL WITH GFR - Abnormal; Notable for the following components:   CO2 21 (*)    BUN 40 (*)    Creatinine, Ser 1.98 (*)    GFR, Estimated 36 (*)    All other components within normal limits  PROTIME-INR  PET scan 12/07/23:  IMPRESSION: Mild asymmetric uptake in the area of lesion involving the right hepatic lobe consistent with known liver metastasis.   No other areas of abnormal uptake to suggest additional areas of disease including other liver lesions, lymph nodes.   Diffuse vascular calcifications including the coronary arteries. Please correlate for acquires factors. Surgical changes along the colon. Left-sided renal cyst.   EKG 01/31/24:  NSR, rate 67  TTE  07/16/2022:  1. Left ventricular ejection fraction, by estimation, is 60 to 65%. The  left ventricle has normal function. The left ventricle has no regional  wall motion abnormalities. There is mild concentric left ventricular  hypertrophy. Left ventricular diastolic  parameters are consistent with Grade I diastolic dysfunction (impaired  relaxation).   2. Right ventricular systolic function is normal. The right ventricular  size is normal. Tricuspid regurgitation signal is inadequate for assessing  PA pressure.   3. The mitral valve is normal in structure. No evidence of mitral valve  regurgitation. No evidence of mitral stenosis.   4. The aortic valve is bicuspid. There is moderate calcification of the  aortic valve. Aortic valve regurgitation is mild. Mild to moderate aortic  valve stenosis. Aortic valve mean gradient measures 20.0 mmHg, AVA 1.65  cm^2.   5. IVC was not visualized.   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   Acute renal failure with acute cortical necrosis    Acute respiratory failure with hypoxemia (HCC) 12/07/2020   Anxiety    Aortic stenosis due to bicuspid aortic valve    Arthritis    Brain tumor (HCC)    frontal   Chronic kidney disease    Colon cancer (HCC)    Confusion    occasionally   Depression    Diabetes mellitus without complication (HCC)    Dizziness    Duodenal ulcer disease    Epilepsy (HCC)    takes Keppra  daily   GERD (gastroesophageal reflux disease)    takes Omeprazole  daily   Gout    Headache    Heart murmur    Hyperlipidemia    takes Atorvastatin  daily   Hypertension    Meningioma (HCC) 09/01/2016   Peripheral edema    takes Lasix  daily   Peripheral neuropathy    takes Gabapentin  daily   Sleep apnea    no cpap   Urinary frequency     Past Surgical History:   Procedure Laterality Date   BIOPSY  09/05/2020   Procedure: BIOPSY;  Surgeon: Abran Norleen SAILOR, MD;  Location: [facility] ENDOSCOPY;  Service: Endoscopy;;   BIOPSY  02/06/2021   Procedure: BIOPSY;  Surgeon: Albertus Gordy HERO, MD;  Location: [facility] ENDOSCOPY;  Service: Gastroenterology;;   CARDIAC CATHETERIZATION  7 yrs ago   COLONOSCOPY WITH PROPOFOL  N/A 09/05/2020   Procedure: COLONOSCOPY WITH PROPOFOL ;  Surgeon: Abran Norleen SAILOR, MD;  Location: [facility] ENDOSCOPY;  Service: Endoscopy;  Laterality: N/A;   CRANIOTOMY N/A 09/01/2016   Procedure: CRANIOTOMY TUMOR  LEFT PTERIONAL;  Surgeon: Rockey Peru, MD;  Location: [facility] OR;  Service: Neurosurgery;  Laterality: N/A;   cyst removed from chest      as a child   ESOPHAGOGASTRODUODENOSCOPY (EGD) WITH PROPOFOL  N/A 02/06/2021   Procedure: ESOPHAGOGASTRODUODENOSCOPY (EGD) WITH PROPOFOL ;  Surgeon: Albertus Gordy HERO, MD;  Location: [facility] ENDOSCOPY;  Service:  Gastroenterology;  Laterality: N/A;   EYE SURGERY Bilateral 02/08/2023   INGUINAL HERNIA REPAIR N/A 07/01/2022   Procedure: HERNIA REPAIR INGUINAL INCARCERATED;  Surgeon: Sheldon Standing, MD;  Location[facility];  Service: General;  Laterality: N/A;   IR FLUORO GUIDE CV LINE LEFT  12/22/2020   IR GASTROSTOMY TUBE MOD SED  02/10/2021   IR GASTROSTOMY TUBE REMOVAL  04/15/2021   IR RADIOLOGIST EVAL & MGMT  12/26/2023   IR REMOVAL TUN CV CATH W/O FL  03/09/2021   IR US  GUIDE VASC ACCESS LEFT  12/22/2020   LAPAROSCOPY  04/29/2023   Procedure: LAPAROSCOPY DIAGNOSTIC;  Surgeon: Paola Dreama SAILOR, MD;  Location: [facility] OR;  Service: General;;   LYSIS OF ADHESION N/A 07/01/2022   Procedure: LYSIS OF ADHESION;  Surgeon: Sheldon Standing, MD;  [facility];  Service: General;  Laterality: N/A;   NO PAST SURGERIES     PARTIAL COLECTOMY N/A 09/10/2020   Procedure: TRANSVERSE COLECTOMY;  Surgeon: Paola Dreama SAILOR, MD;  Location: [facility] OR;  Service: General;  Laterality: N/A;   POLYPECTOMY  09/05/2020   Procedure: POLYPECTOMY;  Surgeon: Abran Norleen SAILOR,  MD;  Location: [facility] ENDOSCOPY;  Service: Endoscopy;;   PORT-A-CATH REMOVAL N/A 10/08/2022   Procedure: REMOVAL PORT-A-CATH;  Surgeon: Paola Dreama SAILOR, MD;  Location: [facility] OR;  Service: General;  Laterality: N/A;  46   PORTACATH PLACEMENT Right 10/01/2020   Procedure: INSERTION PORT-A-CATH;  Surgeon: Paola Dreama SAILOR, MD;  Location: [facility] OR;  Service: General;  Laterality: Right;   SUBMUCOSAL TATTOO INJECTION  09/05/2020   Procedure: SUBMUCOSAL TATTOO INJECTION;  Surgeon: Abran Norleen SAILOR, MD;  Location: [facility] ENDOSCOPY;  Service: Endoscopy;;   TRACHEOSTOMY TUBE PLACEMENT N/A 01/05/2021   Procedure: TRACHEOSTOMY;  Surgeon: Jesus Oliphant, MD;  Location: [facility] OR;  Service: ENT;  Laterality: N/A;   VENTRAL HERNIA REPAIR N/A 04/29/2023   Procedure: VENTRAL HERNIA REPAIR WITH MESH;  Surgeon: Paola Dreama SAILOR, MD;  Location[facility];  Service: General;  Laterality: N/A;   XI ROBOTIC ASSISTED COLOSTOMY TAKEDOWN N/A 07/01/2022   Procedure: ROBOTIC OSTOMY TAKEDOWN WITH INTRACORPOREAL ANASTOMOSIS WITH FIREFLY;  Surgeon: Sheldon Standing, MD;  Location[facility];  Service: General;  Laterality: N/A;    MEDICATIONS:  acetaminophen  (TYLENOL ) 500 MG tablet   allopurinol  (ZYLOPRIM ) 100 MG tablet   amLODipine  (NORVASC ) 10 MG tablet   atorvastatin  (LIPITOR) 20 MG tablet   dorzolamide-timolol (COSOPT) 2-0.5 % ophthalmic solution   gabapentin  (NEURONTIN ) 100 MG capsule   ibuprofen  (ADVIL ) 800 MG tablet   levETIRAcetam  (KEPPRA ) 500 MG tablet   omeprazole  (PRILOSEC) 20 MG capsule   Polyethyl Glycol-Propyl Glycol 0.4-0.3 % SOLN   polyethylene glycol (MIRALAX  / GLYCOLAX ) 17 g packet   tamsulosin (FLOMAX) 0.4 MG CAPS capsule   No current facility-administered medications for this encounter.   Burnard CHRISTELLA Odis DEVONNA [facility] Short Stay/Anesthesiology [facility] Phone [phone] 01/31/2024 1:03 PM

## 2024-02-01 ENCOUNTER — Ambulatory Visit (HOSPITAL_COMMUNITY)
Admission: RE | Admit: 2024-02-01 | Discharge: 2024-02-01 | Disposition: A | Discharge status: Home / Self Care | Source: Ambulatory Visit | Attending: Interventional Radiology | Admitting: Interventional Radiology

## 2024-02-01 ENCOUNTER — Encounter (HOSPITAL_COMMUNITY)
Admission: RE | Disposition: A | Discharge status: Home / Self Care | Payer: Self-pay | Source: Home / Self Care | Attending: Interventional Radiology

## 2024-02-01 ENCOUNTER — Other Ambulatory Visit: Payer: Self-pay

## 2024-02-01 ENCOUNTER — Encounter (HOSPITAL_COMMUNITY): Payer: Self-pay | Admitting: Interventional Radiology

## 2024-02-01 ENCOUNTER — Ambulatory Visit (HOSPITAL_BASED_OUTPATIENT_CLINIC_OR_DEPARTMENT_OTHER): Payer: Self-pay | Admitting: Anesthesiology

## 2024-02-01 ENCOUNTER — Ambulatory Visit (HOSPITAL_COMMUNITY): Payer: Self-pay | Admitting: Medical

## 2024-02-01 ENCOUNTER — Ambulatory Visit (HOSPITAL_COMMUNITY)
Admission: RE | Admit: 2024-02-01 | Discharge: 2024-02-01 | Disposition: A | Discharge status: Home / Self Care | Attending: Interventional Radiology | Admitting: Interventional Radiology

## 2024-02-01 DIAGNOSIS — N189 Chronic kidney disease, unspecified: Secondary | ICD-10-CM | POA: Insufficient documentation

## 2024-02-01 DIAGNOSIS — G4730 Sleep apnea, unspecified: Secondary | ICD-10-CM | POA: Insufficient documentation

## 2024-02-01 DIAGNOSIS — R160 Hepatomegaly, not elsewhere classified: Secondary | ICD-10-CM | POA: Diagnosis present

## 2024-02-01 DIAGNOSIS — I129 Hypertensive chronic kidney disease with stage 1 through stage 4 chronic kidney disease, or unspecified chronic kidney disease: Secondary | ICD-10-CM | POA: Insufficient documentation

## 2024-02-01 DIAGNOSIS — Z86011 Personal history of benign neoplasm of the brain: Secondary | ICD-10-CM | POA: Diagnosis not present

## 2024-02-01 DIAGNOSIS — E1142 Type 2 diabetes mellitus with diabetic polyneuropathy: Secondary | ICD-10-CM | POA: Insufficient documentation

## 2024-02-01 DIAGNOSIS — Z8673 Personal history of transient ischemic attack (TIA), and cerebral infarction without residual deficits: Secondary | ICD-10-CM | POA: Diagnosis not present

## 2024-02-01 DIAGNOSIS — K219 Gastro-esophageal reflux disease without esophagitis: Secondary | ICD-10-CM | POA: Insufficient documentation

## 2024-02-01 DIAGNOSIS — D631 Anemia in chronic kidney disease: Secondary | ICD-10-CM | POA: Diagnosis not present

## 2024-02-01 DIAGNOSIS — Z79899 Other long term (current) drug therapy: Secondary | ICD-10-CM | POA: Diagnosis not present

## 2024-02-01 DIAGNOSIS — C787 Secondary malignant neoplasm of liver and intrahepatic bile duct: Secondary | ICD-10-CM | POA: Insufficient documentation

## 2024-02-01 DIAGNOSIS — F419 Anxiety disorder, unspecified: Secondary | ICD-10-CM | POA: Insufficient documentation

## 2024-02-01 DIAGNOSIS — G40909 Epilepsy, unspecified, not intractable, without status epilepticus: Secondary | ICD-10-CM | POA: Diagnosis not present

## 2024-02-01 DIAGNOSIS — Z85038 Personal history of other malignant neoplasm of large intestine: Secondary | ICD-10-CM | POA: Insufficient documentation

## 2024-02-01 DIAGNOSIS — F32A Depression, unspecified: Secondary | ICD-10-CM | POA: Insufficient documentation

## 2024-02-01 DIAGNOSIS — E1122 Type 2 diabetes mellitus with diabetic chronic kidney disease: Secondary | ICD-10-CM | POA: Insufficient documentation

## 2024-02-01 DIAGNOSIS — Z87891 Personal history of nicotine dependence: Secondary | ICD-10-CM | POA: Diagnosis not present

## 2024-02-01 HISTORY — PX: RADIOLOGY WITH ANESTHESIA: SHX6223

## 2024-02-01 LAB — GLUCOSE, CAPILLARY: Glucose-Capillary: 102 mg/dL — ABNORMAL HIGH (ref 70–99)

## 2024-02-01 SURGERY — RADIOLOGY WITH ANESTHESIA
Anesthesia: General

## 2024-02-01 MED ORDER — SODIUM CHLORIDE 0.9 % IV SOLN: INTRAVENOUS | Status: DC

## 2024-02-01 MED ORDER — PROPOFOL 10 MG/ML IV BOLUS
INTRAVENOUS | Status: DC | PRN
  Administered 2024-02-01: 120 mg via INTRAVENOUS

## 2024-02-01 MED ORDER — BUPIVACAINE HCL (PF) 0.5 % IJ SOLN
INTRAMUSCULAR | Status: AC
  Filled 2024-02-01: qty 30

## 2024-02-01 MED ORDER — FENTANYL CITRATE PF 50 MCG/ML IJ SOSY
PREFILLED_SYRINGE | INTRAMUSCULAR | Status: AC
  Filled 2024-02-01: qty 2

## 2024-02-01 MED ORDER — FENTANYL CITRATE (PF) 100 MCG/2ML IJ SOLN
INTRAMUSCULAR | Status: DC | PRN
  Administered 2024-02-01: 50 ug via INTRAVENOUS

## 2024-02-01 MED ORDER — ONDANSETRON HCL 4 MG/2ML IJ SOLN: 4.0000 mg | Freq: Once | INTRAMUSCULAR | Status: DC | PRN

## 2024-02-01 MED ORDER — MIDAZOLAM HCL 2 MG/2ML IJ SOLN
INTRAMUSCULAR | Status: AC
  Filled 2024-02-01: qty 2

## 2024-02-01 MED ORDER — ACETAMINOPHEN 10 MG/ML IV SOLN: 1000.0000 mg | Freq: Once | INTRAVENOUS | Status: DC | PRN

## 2024-02-01 MED ORDER — ROCURONIUM BROMIDE 100 MG/10ML IV SOLN
INTRAVENOUS | Status: DC | PRN
  Administered 2024-02-01: 10 mg via INTRAVENOUS
  Administered 2024-02-01: 70 mg via INTRAVENOUS
  Administered 2024-02-01: 20 mg via INTRAVENOUS

## 2024-02-01 MED ORDER — FENTANYL CITRATE PF 50 MCG/ML IJ SOSY
PREFILLED_SYRINGE | INTRAMUSCULAR | Status: AC
  Filled 2024-02-01: qty 1

## 2024-02-01 MED ORDER — HYDRALAZINE HCL 20 MG/ML IJ SOLN
10.0000 mg | Freq: Once | INTRAMUSCULAR | Status: AC
  Administered 2024-02-01: 10 mg via INTRAVENOUS

## 2024-02-01 MED ORDER — EPHEDRINE SULFATE-NACL 50-0.9 MG/10ML-% IV SOSY
PREFILLED_SYRINGE | INTRAVENOUS | Status: DC | PRN
  Administered 2024-02-01: 5 mg via INTRAVENOUS

## 2024-02-01 MED ORDER — FENTANYL CITRATE PF 50 MCG/ML IJ SOSY
25.0000 ug | PREFILLED_SYRINGE | INTRAMUSCULAR | Status: DC | PRN
  Administered 2024-02-01 (×2): 50 ug via INTRAVENOUS

## 2024-02-01 MED ORDER — LACTATED RINGERS IV SOLN: INTRAVENOUS | Status: DC

## 2024-02-01 MED ORDER — OXYCODONE HCL 5 MG PO TABS: 10.0000 mg | ORAL_TABLET | ORAL | Status: DC | PRN

## 2024-02-01 MED ORDER — SODIUM CHLORIDE 0.9 % IV SOLN
2.0000 g | Freq: Once | INTRAVENOUS | Status: AC
  Administered 2024-02-01: 2 g via INTRAVENOUS
  Filled 2024-02-01: qty 2

## 2024-02-01 MED ORDER — CHLORHEXIDINE GLUCONATE CLOTH 2 % EX PADS: 6.0000 | MEDICATED_PAD | Freq: Every day | CUTANEOUS | Status: DC

## 2024-02-01 MED ORDER — MIDAZOLAM HCL 5 MG/5ML IJ SOLN
INTRAMUSCULAR | Status: DC | PRN
  Administered 2024-02-01: 2 mg via INTRAVENOUS

## 2024-02-01 MED ORDER — OXYCODONE HCL 5 MG PO TABS: 5.0000 mg | ORAL_TABLET | Freq: Once | ORAL | Status: DC | PRN

## 2024-02-01 MED ORDER — SUGAMMADEX SODIUM 200 MG/2ML IV SOLN
INTRAVENOUS | Status: DC | PRN
  Administered 2024-02-01: 200 mg via INTRAVENOUS

## 2024-02-01 MED ORDER — HYDRALAZINE HCL 20 MG/ML IJ SOLN
INTRAMUSCULAR | Status: AC
  Filled 2024-02-01: qty 1

## 2024-02-01 MED ORDER — OXYCODONE HCL 5 MG/5ML PO SOLN: 5.0000 mg | Freq: Once | ORAL | Status: DC | PRN

## 2024-02-01 MED ORDER — CHLORHEXIDINE GLUCONATE 0.12 % MT SOLN: 15.0000 mL | Freq: Once | OROMUCOSAL | Status: DC

## 2024-02-01 MED ORDER — LIDOCAINE HCL (CARDIAC) PF 100 MG/5ML IV SOSY
PREFILLED_SYRINGE | INTRAVENOUS | Status: DC | PRN
  Administered 2024-02-01: 100 mg via INTRAVENOUS

## 2024-02-01 MED ORDER — ORAL CARE MOUTH RINSE: 15.0000 mL | Freq: Once | OROMUCOSAL | Status: DC

## 2024-02-01 MED ORDER — ONDANSETRON HCL 4 MG/2ML IJ SOLN
INTRAMUSCULAR | Status: DC | PRN
  Administered 2024-02-01: 4 mg via INTRAVENOUS

## 2024-02-01 MED ORDER — IOHEXOL 300 MG/ML  SOLN
80.0000 mL | Freq: Once | INTRAMUSCULAR | Status: AC | PRN
  Administered 2024-02-01: 80 mL via INTRAVENOUS

## 2024-02-01 MED ORDER — DEXAMETHASONE SODIUM PHOSPHATE 4 MG/ML IJ SOLN
INTRAMUSCULAR | Status: DC | PRN
Start: 2024-02-01 — End: 2024-02-01
  Administered 2024-02-01: 8 mg via INTRAVENOUS

## 2024-02-01 NOTE — Procedures (Signed)
 Vascular and Interventional Radiology Procedure Note  Patient: Thomas [name] DOB: [birth year] Medical Record Number: [id number] Note Date/Time: 02/01/24 9:25 AM   Performing Physician: Thom Hall, MD Assistant(s): None  Diagnosis: Hx colon CA w solitary liver metastasis.   Procedure: PERCUTANEOUS MICROWAVE ABLATION of a RIGHT LIVER MASS  Anesthesia: General Anesthesia Complications: None Estimated Blood Loss: Minimal Specimens: Sent for None  Findings:  Successful CT-guided MWA of small, R hepatic lobe (seg VIII) liver mass. Single probe ablation w 10 minutes prescribed burn time. Hemostasis of the tract was achieved using manual pressure.  Plan: Bed rest for 3 hours.  See detailed procedure note with images in PACS. The patient tolerated the procedure well without incident or complication and was returned to PACU in stable condition.    Thom Hall, MD [facility] Radiology   Pager. [phone] Clinic. [phone]

## 2024-02-01 NOTE — Transfer of Care (Signed)
 Immediate Anesthesia Transfer of Care Note  Patient: Thomas [name]  Procedure(s) Performed: CT GUIDE TISSUE ABLATION RADIOLOGY WITH ANESTHESIA  Patient Location: PACU  Anesthesia Type:General  Level of Consciousness: drowsy and responds to stimulation  Airway & Oxygen Therapy: Patient Spontanous Breathing  Post-op Assessment: Report given to RN  Post vital signs: Reviewed and stable  Last Vitals:  Vitals Value Taken Time  BP 127/72 02/01/24 11:32  Temp    Pulse 58 02/01/24 11:36  Resp 12 02/01/24 11:36  SpO2 97 % 02/01/24 11:36  Vitals shown include unfiled device data.  Last Pain:  Vitals:   02/01/24 0635  TempSrc: Oral         Complications: No notable events documented.

## 2024-02-01 NOTE — Anesthesia Procedure Notes (Signed)
 Procedure Name: Intubation Date/Time: 02/01/2024 9:29 AM  Performed by: Delores Duwaine SAUNDERS, CRNAPre-anesthesia Checklist: Patient identified, Emergency Drugs available, Suction available and Patient being monitored Patient Re-evaluated:Patient Re-evaluated prior to induction Oxygen Delivery Method: Circle System Utilized Preoxygenation: Pre-oxygenation with 100% oxygen Induction Type: IV induction Ventilation: Mask ventilation without difficulty Laryngoscope Size: Glidescope and 4 Grade View: Grade I Tube type: Oral Tube size: 7.5 mm Number of attempts: 1 Airway Equipment and Method: Stylet and Oral airway Placement Confirmation: ETT inserted through vocal cords under direct vision, positive ETCO2 and breath sounds checked- equal and bilateral Secured at: 22 cm Tube secured with: Tape Dental Injury: Teeth and Oropharynx as per pre-operative assessment

## 2024-02-01 NOTE — Discharge Instructions (Addendum)
 May resume home medications, stay well-hydrated, avoid strenuous activity for 1 week.  Call [phone] or [phone] with any questions or concerns.

## 2024-02-01 NOTE — Progress Notes (Signed)
 Patient ID: Thomas [name], male   DOB: Feb 26, 1956, 68 y.o.   MRN: [id number] Pt s/p CT-guided microwave ablation of a right hepatic lobe liver mass earlier today (met colon ca); currently in PACU.  Afebrile; patient denies fever, headache, chest pain, dyspnea, cough, worsening abdominal/back pain, nausea, vomiting or bleeding.  He does have some fatigue.  Puncture site right upper abdominal region clean, dry, nontender, no hematoma.  Abdomen soft, nontender, nondistended.  Foley catheter in place draining yellow urine.  Patient was seen by Dr.  Hughes and deemed stable for discharge home once he has eaten and voided on his own.  He may resume his home medications.  Dr.  Hughes will contact patient tomorrow to check on his status.  He will follow-up in IR clinic in 4 weeks. Family updated.

## 2024-02-02 NOTE — Anesthesia Postprocedure Evaluation (Signed)
 Anesthesia Post Note  Patient: Thomas [name]  Procedure(s) Performed: CT GUIDE TISSUE ABLATION RADIOLOGY WITH ANESTHESIA     Patient location during evaluation: PACU Anesthesia Type: General Level of consciousness: awake and alert Pain management: pain level controlled Vital Signs Assessment: post-procedure vital signs reviewed and stable Respiratory status: spontaneous breathing, nonlabored ventilation, respiratory function stable and patient connected to nasal cannula oxygen Cardiovascular status: blood pressure returned to baseline and stable Postop Assessment: no apparent nausea or vomiting Anesthetic complications: no   No notable events documented.  Last Vitals:  Vitals:   02/01/24 1430 02/01/24 1500  BP: (!) 164/81 (!) 164/82  Pulse: 72   Resp:  16  Temp:    SpO2:      Last Pain:  Vitals:   02/01/24 1500  TempSrc:   PainSc: 0-No pain                 [name]

## 2024-02-03 ENCOUNTER — Encounter (HOSPITAL_COMMUNITY): Payer: Self-pay | Admitting: Interventional Radiology

## 2024-03-06 NOTE — Progress Notes (Signed)
 Reason for visit: Post procedure follow up. Ablation of CRC liver metastasis   Care Team(s): Primary Care: [name], NP Medical Oncology; Cloretta Arley NOVAK, MD General Surgeon; Paola Dreama SAILOR, MD  Virtual Visit via Video Conferencing  I connected with [name] on 03/07/24 by video-telephonic conference and verified that I am speaking with the correct person using two identifiers. I discussed the limitations, risks, security and privacy concerns of performing an evaluation and management service by tele-visit and the availability of in-person appointments.  History of present illness:  [name] is a pleasant 68 y.o. male 60 comorbid w PMHx significant for colon CA s/p tranverse colectomy 09/2020, followed by ostomy takedown 06/2022 c/b hernia now s/p mesh repair 04/2023. Pt is followed closely by oncology and was noted to have an enlarging R liver lesion on surveillance imaging and identified as a solitary lesion on definitive imaging w MR (05/10/23). Initial liver mass Bx (05/12/23), was non-diagnostic and likely a sampling error. Follow up imaging w repeat MR and PET CT continued to demonstrate an enhancing ~2.0 cm liver lesion, HM on PET and suspicious for isolated hepatic metastasis.  Pt was referred to me for consideration of repeat Bx and minimally invasive treatment of his suspected liver metastasis, of which the procedures were staged; with Bx and fiducial marker placement performed on 01/04/24 followed by percutaneous MWA of path-positive tumor on 02/01/24. Pt was discharged same day after extended recovery. He returns today for initial post procedure follow up and reports to be back to baseline. He denies any abdominal discomfort, N/V. Endorses normal bowel function.  Review of Systems: A 12 point ROS discussed and pertinent positives are indicated in the HPI above.  All other systems are negative.   Past Medical History:  Diagnosis Date   Acute encephalopathy  04/09/2014   Acute ischemic left MCA stroke (HCC) 04/09/2014   pt states he did not have a stroke, but knows that this is on his med history.   Acute renal failure with acute cortical necrosis    Acute respiratory failure with hypoxemia (HCC) 12/07/2020   Anxiety    Aortic stenosis due to bicuspid aortic valve    Arthritis    Brain tumor (HCC)    frontal   Chronic kidney disease    Colon cancer (HCC)    Confusion    occasionally   Depression    Diabetes mellitus without complication (HCC)    Dizziness    Duodenal ulcer disease    Epilepsy (HCC)    takes Keppra  daily   GERD (gastroesophageal reflux disease)    takes Omeprazole  daily   Gout    Headache    Heart murmur    Hyperlipidemia    takes Atorvastatin  daily   Hypertension    Meningioma (HCC) 09/01/2016   Peripheral edema    takes Lasix  daily   Peripheral neuropathy    takes Gabapentin  daily   Sleep apnea    no cpap   Urinary frequency     Past Surgical History:  Procedure Laterality Date   BIOPSY  09/05/2020   Procedure: BIOPSY;  Surgeon: Abran Norleen SAILOR, MD;  Location: [facility] ENDOSCOPY;  Service: Endoscopy;;   BIOPSY  02/06/2021   Procedure: BIOPSY;  Surgeon: Albertus Gordy HERO, MD;  Location: [facility] ENDOSCOPY;  Service: Gastroenterology;;   CARDIAC CATHETERIZATION  7 yrs ago   COLONOSCOPY WITH PROPOFOL  N/A 09/05/2020   Procedure: COLONOSCOPY WITH PROPOFOL ;  Surgeon: Abran Norleen SAILOR, MD;  Location: [facility] ENDOSCOPY;  Service: Endoscopy;  Laterality: N/A;   CRANIOTOMY N/A 09/01/2016   Procedure: CRANIOTOMY TUMOR  LEFT PTERIONAL;  Surgeon: Rockey Peru, MD;  Location: [facility] OR;  Service: Neurosurgery;  Laterality: N/A;   cyst removed from chest      as a child   ESOPHAGOGASTRODUODENOSCOPY (EGD) WITH PROPOFOL  N/A 02/06/2021   Procedure: ESOPHAGOGASTRODUODENOSCOPY (EGD) WITH PROPOFOL ;  Surgeon: Albertus Gordy HERO, MD;  Location: [facility] ENDOSCOPY;  Service: Gastroenterology;  Laterality: N/A;   EYE SURGERY Bilateral 02/08/2023   INGUINAL HERNIA  REPAIR N/A 07/01/2022   Procedure: HERNIA REPAIR INGUINAL INCARCERATED;  Surgeon: Sheldon Standing, MD;  Location: [facility] ORS;  Service: General;  Laterality: N/A;   IR FLUORO GUIDE CV LINE LEFT  12/22/2020   IR GASTROSTOMY TUBE MOD SED  02/10/2021   IR GASTROSTOMY TUBE REMOVAL  04/15/2021   IR RADIOLOGIST EVAL & MGMT  12/26/2023   IR RADIOLOGIST EVAL & MGMT  03/07/2024   IR REMOVAL TUN CV CATH W/O FL  03/09/2021   IR US  GUIDE VASC ACCESS LEFT  12/22/2020   LAPAROSCOPY  04/29/2023   Procedure: LAPAROSCOPY DIAGNOSTIC;  Surgeon: Paola Dreama SAILOR, MD;  Location: [facility] OR;  Service: General;;   LYSIS OF ADHESION N/A 07/01/2022   Procedure: LYSIS OF ADHESION;  Surgeon: Sheldon Standing, MD;  Location: [facility] ORS;  Service: General;  Laterality: N/A;   NO PAST SURGERIES     PARTIAL COLECTOMY N/A 09/10/2020   Procedure: TRANSVERSE COLECTOMY;  Surgeon: Paola Dreama SAILOR, MD;  Location: [facility] OR;  Service: General;  Laterality: N/A;   POLYPECTOMY  09/05/2020   Procedure: POLYPECTOMY;  Surgeon: Abran Norleen SAILOR, MD;  Location: [facility] ENDOSCOPY;  Service: Endoscopy;;   PORT-A-CATH REMOVAL N/A 10/08/2022   Procedure: REMOVAL PORT-A-CATH;  Surgeon: Paola Dreama SAILOR, MD;  Location: [facility] OR;  Service: General;  Laterality: N/A;  46   PORTACATH PLACEMENT Right 10/01/2020   Procedure: INSERTION PORT-A-CATH;  Surgeon: Paola Dreama SAILOR, MD;  Location: [facility] OR;  Service: General;  Laterality: Right;   RADIOLOGY WITH ANESTHESIA N/A 02/01/2024   Procedure: RADIOLOGY WITH ANESTHESIA;  Surgeon: Hughes Simmonds, MD;  Location: [facility] ORS;  Service: Radiology;  Laterality: N/A;  CT MICROWAVE ABLATION OF LIVER   SUBMUCOSAL TATTOO INJECTION  09/05/2020   Procedure: SUBMUCOSAL TATTOO INJECTION;  Surgeon: Abran Norleen SAILOR, MD;  Location: [facility] ENDOSCOPY;  Service: Endoscopy;;   TRACHEOSTOMY TUBE PLACEMENT N/A 01/05/2021   Procedure: TRACHEOSTOMY;  Surgeon: Jesus Oliphant, MD;  Location: [facility] OR;  Service: ENT;  Laterality: N/A;   VENTRAL HERNIA REPAIR N/A 04/29/2023    Procedure: VENTRAL HERNIA REPAIR WITH MESH;  Surgeon: Paola Dreama SAILOR, MD;  Location: [facility] OR;  Service: General;  Laterality: N/A;   XI ROBOTIC ASSISTED COLOSTOMY TAKEDOWN N/A 07/01/2022   Procedure: ROBOTIC OSTOMY TAKEDOWN WITH INTRACORPOREAL ANASTOMOSIS WITH FIREFLY;  Surgeon: Sheldon Standing, MD;  Location: [facility] ORS;  Service: General;  Laterality: N/A;    Allergies: Morphine  and codeine  Medications: Prior to Admission medications   Medication Sig Start Date End Date Taking? Authorizing Provider  acetaminophen  (TYLENOL ) 500 MG tablet Take 2 tablets (1,000 mg total) by mouth every 6 (six) hours. 10/08/22   Paola Dreama SAILOR, MD  allopurinol  (ZYLOPRIM ) 100 MG tablet Take 100 mg by mouth at bedtime. 02/15/22   [provider]  amLODipine  (NORVASC ) 10 MG tablet Take 10 mg by mouth at bedtime. 05/11/20   [provider]  atorvastatin  (LIPITOR) 20 MG tablet Take 20 mg by mouth in the  morning.    [provider]  dorzolamide-timolol (COSOPT) 2-0.5 % ophthalmic solution Place 1 drop into both eyes 2 (two) times daily. 10/30/23   [provider]  gabapentin  (NEURONTIN ) 100 MG capsule Take 100-200 mg by mouth See admin instructions. Take 1 capsule (100 mg) by mouth in the morning & take 2 capsules (200 mg) by mouth in the evening.    [provider]  ibuprofen  (ADVIL ) 800 MG tablet Take 800 mg by mouth daily as needed (pain.).    [provider]  levETIRAcetam  (KEPPRA ) 500 MG tablet Take 500 mg by mouth 2 (two) times daily. 08/27/21   [provider]  omeprazole  (PRILOSEC) 20 MG capsule Take 20 mg by mouth in the morning and at bedtime.    [provider]  Polyethyl Glycol-Propyl Glycol 0.4-0.3 % SOLN Place 1-2 drops into both eyes 3 (three) times daily as needed (dry/irritated eyes.).    [provider]  polyethylene glycol (MIRALAX  / GLYCOLAX ) 17 g packet Take 17 g by mouth daily as needed (constipation.).    [provider]  tamsulosin (FLOMAX) 0.4 MG CAPS capsule Take 0.4 mg by mouth daily in the afternoon. 09/15/23   [provider]     Family History  Problem Relation Age of Onset   Breast cancer Mother    Breast cancer Maternal Aunt    Prostate cancer Maternal Uncle    Colon cancer Neg Hx    Esophageal cancer Neg Hx    Rectal cancer Neg Hx    Stomach cancer Neg Hx     Social History   Socioeconomic History   Marital status: Married    Spouse name: Clinical Biochemist   Number of children: 4   Years of education: Not on file   Highest education level: Not on file  Occupational History   Occupation: Disabled  Tobacco Use   Smoking status: Former    Types: Cigarettes   Smokeless tobacco: Never   Tobacco comments:    quit smoking 35 yrs ago  Vaping Use   Vaping status: Never Used  Substance and Sexual Activity   Alcohol  use: No   Drug use: No   Sexual activity: Yes  Other Topics Concern   Not on file  Social History Narrative   Married to his wife, Abel with total of #4 children (#2 with current wife). Lives in home with his son and daughter-in-law.     Social Drivers of Corporate Investment Banker Strain: Not on file  Food Insecurity: No Food Insecurity (04/29/2023)   Hunger Vital Sign    Worried About Running Out of Food in the Last Year: Never true    Ran Out of Food in the Last Year: Never true  Transportation Needs: No Transportation Needs (04/29/2023)   PRAPARE - Administrator, Civil Service (Medical): No    Lack of Transportation (Non-Medical): No  Physical Activity: Not on file  Stress: Not on file  Social Connections: Unknown (05/10/2023)   Social Connection and Isolation Panel    Frequency of Communication with Friends and Family: Never    Frequency of Social Gatherings with Friends and Family: Never    Attends Religious Services: Never    Database Administrator or Organizations: No    Attends Banker Meetings: Never    Marital  Status: Patient declined     Vital Signs: There were no vitals taken for this visit.  Physical Exam Deferred secondary to virtual  visit.  Imaging:  IR CT MWA, 02/01/24 Successful targeting and ablation of seg 8 lesion    No results found.  Labs:  CBC: Recent Labs    05/18/23 0401 11/19/23 1011 01/04/24 0856 01/31/24 1140  WBC 21.7* 11.0* 10.7* 11.7*  HGB 10.2* 12.8* 13.3 13.4  HCT 28.9* 36.5* 39.4 40.4  PLT 556* 264 245 253    COAGS: Recent Labs    05/12/23 0358 01/04/24 0856 01/31/24 1140  INR 1.1 1.0 1.0    BMP: Recent Labs    10/31/23 0846 11/19/23 1011 01/04/24 0856 01/31/24 1140  NA 138 139 139 139  K 4.5 4.4 4.7 4.8  CL 105 107 107 108  CO2 22 20* 20* 21*  GLUCOSE 106* 129* 94 98  BUN 30* 38* 32* 40*  CALCIUM  9.4 9.0 9.4 9.4  CREATININE 1.86* 1.82* 1.88* 1.98*  GFRNONAA 39* 40* 39* 36*    LIVER FUNCTION TESTS: Recent Labs    05/16/23 0337 11/19/23 1011 01/04/24 0856 01/31/24 1140  BILITOT 0.8 0.5 0.5 0.6  AST 13* 26 27 21   ALT 23 47* 31 27  ALKPHOS 61 96 95 96  PROT 6.9 7.3 7.4 7.5  ALBUMIN  2.9* 3.9 4.0 4.0   MELD 3.0: 14 at 01/31/2024 11:40 AM MELD-Na: 13 at 01/31/2024 11:40 AM Calculated from: Serum Creatinine: 1.98 mg/dL at 0/76/7974 88:59 AM Serum Sodium: 139 mmol/L (Using max of 137 mmol/L) at 01/31/2024 11:40 AM Total Bilirubin: 0.6 mg/dL (Using min of 1 mg/dL) at 0/76/7974 88:59 AM Serum Albumin : 4 g/dL (Using max of 3.5 g/dL) at 0/76/7974 88:59 AM INR(ratio): 1 at 01/31/2024 11:40 AM Age at listing (hypothetical): 44 years Sex: Male at 01/31/2024 11:40 AM  ONCOLOGY:  Cancer Staging  Colon cancer [facility]) Staging form: Colon and Rectum, AJCC 8th Edition - Pathologic: Stage IV   Latest Reference Range & Units 09/05/20 14:25 05/09/23 01:06 01/04/24 13:30  CEA 0.0 - 4.7 ng/mL 1.7 2.0 5.1 (H)  (H): Data is abnormally high  Pathology;  SURGICAL PATHOLOGY CASE: [id number] PATIENT: Thomas [name] Surgical Pathology  Report  Clinical History: Hx of colon CA w HM liver lesion, prior bx negative (las)  FINAL MICROSCOPIC DIAGNOSIS:  A. LIVER, BIOPSY: - Metastatic adenocarcinoma, consistent with metastasis from the patient's known colorectal adenocarcinoma (see comment).   Assessment and Plan:  68 y/o M comorbid w PMHx significant for colon CA s/p tranverse colectomy (09/2020), w enlarging R liver lesion suspicious for solitary hepatic metastasis, s/p non-diagnostic liver mass Bx (05/2023).   Pt s/p completion of staged repeat Bx, fiducial marker placement (01/04/24) then MWA ablation of liver metastasis (02/01/24) Pathology positive for CRC liver metastasis.  Colon CA Stage IVa. ECOG 0. T2DM and CKD w baseline sCr ~1.8, GFR <60 (~40)  *completion of directed minimally invasive therapies at this time.  Pt OK to resume physical activity as tolerated. *Follow up imaging with 3 mos post ablation (02/01/24) multiphasic MR Abdomen, no sooner. *Will see him back in virtual clinic for imaging review. *Pt will also follow up with his Oncologist, Dr Cloretta  *Comorbidity follow up with PCP   Thank you for allowing us  to participate in the care of this Patient. Please contact me with questions, concerns, or if new issues arise.  Electronically Signed:  Thom Hall, MD [facility]   Pager. [phone] Clinic. [phone]  As part of this video-telephonic encounter, no in-person exam was conducted. The patient was physically located in [place]  or a state in  which I am permitted to provide care. The encounter was reasonable and appropriate under the circumstances given the patient's presentation at the time.  I spent a total of 25 Minutes of non-face-to-face time in clinical consultation, greater than 50% of which was counseling/coordinating care for [name]'s evaluation for followup of liver metastasis.

## 2024-03-07 ENCOUNTER — Inpatient Hospital Stay
Admission: RE | Admit: 2024-03-07 | Discharge: 2024-03-07 | Disposition: A | Source: Ambulatory Visit | Attending: Radiology | Admitting: Radiology

## 2024-03-07 DIAGNOSIS — R160 Hepatomegaly, not elsewhere classified: Secondary | ICD-10-CM

## 2024-03-07 HISTORY — PX: IR RADIOLOGIST EVAL & MGMT: IMG5224

## 2024-03-08 ENCOUNTER — Ambulatory Visit

## 2024-03-08 ENCOUNTER — Ambulatory Visit (HOSPITAL_COMMUNITY)

## 2024-03-15 ENCOUNTER — Encounter: Payer: Self-pay | Admitting: Interventional Radiology

## 2024-04-03 ENCOUNTER — Telehealth: Payer: Self-pay | Admitting: Oncology

## 2024-04-03 NOTE — Telephone Encounter (Signed)
PT CALLED TO CONFIRM APPT

## 2024-04-09 ENCOUNTER — Ambulatory Visit: Payer: Self-pay | Admitting: Oncology

## 2024-04-09 ENCOUNTER — Inpatient Hospital Stay: Attending: Oncology

## 2024-04-09 ENCOUNTER — Inpatient Hospital Stay

## 2024-04-09 ENCOUNTER — Other Ambulatory Visit: Payer: Self-pay | Admitting: *Deleted

## 2024-04-09 ENCOUNTER — Inpatient Hospital Stay: Attending: Oncology | Admitting: Oncology

## 2024-04-09 VITALS — BP 138/68 | HR 89 | Temp 98.3°F | Resp 18 | Ht 68.0 in | Wt 229.0 lb

## 2024-04-09 DIAGNOSIS — C787 Secondary malignant neoplasm of liver and intrahepatic bile duct: Secondary | ICD-10-CM | POA: Diagnosis present

## 2024-04-09 DIAGNOSIS — C184 Malignant neoplasm of transverse colon: Secondary | ICD-10-CM | POA: Insufficient documentation

## 2024-04-09 DIAGNOSIS — Z992 Dependence on renal dialysis: Secondary | ICD-10-CM | POA: Insufficient documentation

## 2024-04-09 DIAGNOSIS — E1140 Type 2 diabetes mellitus with diabetic neuropathy, unspecified: Secondary | ICD-10-CM | POA: Insufficient documentation

## 2024-04-09 DIAGNOSIS — G40909 Epilepsy, unspecified, not intractable, without status epilepticus: Secondary | ICD-10-CM | POA: Insufficient documentation

## 2024-04-09 DIAGNOSIS — N189 Chronic kidney disease, unspecified: Secondary | ICD-10-CM | POA: Diagnosis not present

## 2024-04-09 DIAGNOSIS — Z7901 Long term (current) use of anticoagulants: Secondary | ICD-10-CM | POA: Insufficient documentation

## 2024-04-09 DIAGNOSIS — E1122 Type 2 diabetes mellitus with diabetic chronic kidney disease: Secondary | ICD-10-CM | POA: Diagnosis not present

## 2024-04-09 DIAGNOSIS — Z930 Tracheostomy status: Secondary | ICD-10-CM | POA: Insufficient documentation

## 2024-04-09 DIAGNOSIS — Z23 Encounter for immunization: Secondary | ICD-10-CM

## 2024-04-09 DIAGNOSIS — Z86718 Personal history of other venous thrombosis and embolism: Secondary | ICD-10-CM | POA: Diagnosis not present

## 2024-04-09 DIAGNOSIS — I129 Hypertensive chronic kidney disease with stage 1 through stage 4 chronic kidney disease, or unspecified chronic kidney disease: Secondary | ICD-10-CM | POA: Insufficient documentation

## 2024-04-09 LAB — BASIC METABOLIC PANEL - CANCER CENTER ONLY
Anion gap: 9 (ref 5–15)
BUN: 36 mg/dL — ABNORMAL HIGH (ref 8–23)
CO2: 25 mmol/L (ref 22–32)
Calcium: 9.8 mg/dL (ref 8.9–10.3)
Chloride: 105 mmol/L (ref 98–111)
Creatinine: 1.89 mg/dL — ABNORMAL HIGH (ref 0.61–1.24)
GFR, Estimated: 38 mL/min — ABNORMAL LOW (ref >60)
Glucose, Bld: 96 mg/dL (ref 70–99)
Potassium: 4.9 mmol/L (ref 3.5–5.1)
Sodium: 139 mmol/L (ref 135–145)

## 2024-04-09 LAB — CEA (ACCESS): CEA (CHCC): 2.58 ng/mL (ref 0.00–5.00)

## 2024-04-09 MED ORDER — INFLUENZA VAC SPLIT HIGH-DOSE 0.5 ML IM SUSY
0.5000 mL | PREFILLED_SYRINGE | Freq: Once | INTRAMUSCULAR | Status: DC
  Filled 2024-04-09: qty 0.5

## 2024-04-09 NOTE — Progress Notes (Signed)
 [facility] OFFICE PROGRESS NOTE   Diagnosis: Colon cancer  INTERVAL HISTORY:   Thomas [name] returns as scheduled.  He feels well.  Good appetite.  He has occasional right abdominal discomfort.  He underwent CT-guided ablation of the segment 8 liver lesion on 02/01/2024.  Reports tolerating the procedure well.  No new complaint.  Objective:  Vital signs in last 24 hours:  Blood pressure 138/68, pulse 89, temperature 98.3 F (36.8 C), temperature source Temporal, resp. rate 18, height 5' 8 (1.727 m), weight 229 lb (103.9 kg), SpO2 98%.    Lymphatics: No cervical, supraclavicular, axillary, or inguinal nodes Resp: Lungs clear bilaterally Cardio: Regular rate and rhythm GI: No mass, nontender, no hepatosplenomegaly Vascular: No leg edema  Lab Results:  Lab Results  Component Value Date   WBC 11.7 (H) 01/31/2024   HGB 13.4 01/31/2024   HCT 40.4 01/31/2024   MCV 77.4 (L) 01/31/2024   PLT 253 01/31/2024   NEUTROABS 8.0 (H) 01/31/2024    CMP  Lab Results  Component Value Date   NA 139 01/31/2024   K 4.8 01/31/2024   CL 108 01/31/2024   CO2 21 (L) 01/31/2024   GLUCOSE 98 01/31/2024   BUN 40 (H) 01/31/2024   CREATININE 1.98 (H) 01/31/2024   CALCIUM  9.4 01/31/2024   PROT 7.5 01/31/2024   ALBUMIN  4.0 01/31/2024   AST 21 01/31/2024   ALT 27 01/31/2024   ALKPHOS 96 01/31/2024   BILITOT 0.6 01/31/2024   GFRNONAA 36 (L) 01/31/2024   GFRAA >60 04/01/2017    Lab Results  Component Value Date   CEA1 5.1 (H) 01/04/2024   CEA 2.31 10/31/2023     Medications: I have reviewed the patient's current medications.   Assessment/Plan: Transverse colon cancer, stage IIIb (T3N1a), status post a partial transverse colectomy and end colostomy 09/10/2020 CT abdomen/pelvis 09/04/2020- possible transverse colon mass with small regional lymph nodes Colonoscopy 09/05/2020- obstructing transverse colon mass-biopsy invasive adenocarcinoma, mass could not be passed CT chest  09/09/2020-no evidence of metastatic disease, 3 x 2 mm subpleural nodule in the right upper lobe likely benign subpleural lymph node Partial transverse colectomy 09/10/2020, transverse colon tumor, no lymphovascular perineural invasion, 1/19 lymph nodes positive, negative margins, MSI stable, no loss of mismatch repair protein expression Cycle 1 CAPOX 10/08/2020, oxaliplatin  dose reduced to 100 mg per metered squared secondary to renal insufficiency Cycle 2 CAPOX 10/29/2020 Cycle 3 CAPOX 11/19/2020, capecitabine  dose reduced secondary to hand/foot syndrome, oxaliplatin  dose reduced secondary to renal failure and poor performance status CT chest/abdomen/pelvis without contrast 12/01/2020-no acute abnormality in the chest, abdomen, or pelvis.  No evidence of obstruction. CTs 03/05/2022-no evidence of recurrent disease CT abdomen/pelvis 07/09/2022 and 07/21/2022-indeterminate segment 7 lesion measuring 1.2 cm CT chest 05/10/2023: New opacity in the right upper lobe-infiltrate?,  Nondependent nodular areas at the lower trachea favor atypical debris MRI abdomen 05/10/2023: 18 x 12 mm cystic lesion in the anterolateral right liver, increased in size from March 2024 05/12/2023: Ultrasound-guided biopsy of right liver lesion, the liver lesion was subtle by ultrasound, benign liver parenchyma -negative for malignancy CT abdomen/pelvis 11/19/2023: Increased size of a 2 cm low-attenuation right liver lesion, small left inguinal hernia MRI liver 11/26/2023: 2 cm lesion in segment 8, progressive from 05/10/2023 MRI PET 12/07/2023: Segment 8 liver lesion with SUV of 5.0, no other areas of abnormal uptake 01/04/2024: CT/ultrasound-guided biopsy and fiducial marker placement, segment 8 mass, metastatic adenocarcinoma-CK20 and CDX2 positive, CK7 negative 02/01/2024 CT-guided percutaneous ablation of segment 8  lesion Resection of a frontal meningioma 09/01/2016 Diabetes Rectal polyp- tubular adenoma on colonoscopy  09/05/2020 Hypertension Diabetic neuropathy Epilepsy Family history of breast and prostate cancer Admission with acute renal failure 10/01/2020-secondary to lack of oral intake and lisinopril , improved with intravenous hydration and holding lisinopril  Admission 12/01/2020-sepsis, diarrhea HHS, A. fib with RVR, abdominal pain 11.  Respiratory failure requiring intubation 12/06/2020 Tracheostomy 12/18/2020 12.  Progressive renal failure-CRRT started 12/06/2020, discontinued after 12/16/2020, hemodialysis initiated 13.  Thrombocytopenia secondary to chemotherapy and sepsis, heparin -induced platelet antibody and SRA negative-resolved 14.  Right lower extremity DVT diagnosed 12/12/2020-on apixaban   15.  Anemia secondary to chronic disease, renal failure, and phlebotomy 16.  Admission 09/29/2021 with rotavirus 17.  Admission 04/29/2023 for an open ventral hernia repair 18.  Postoperative ileus/small bowel obstruction December 2024 19.  Chronic renal failure/acute renal injury following the ventral hernia repair December 2024        Disposition: Thomas [name] appears well.  He underwent ablation of the segment 8 liver lesion in September.  We will follow-up on the CEA from today.  Dr.Mugweru will schedule a follow-up MRI of the liver within the next 1-2 months.  Thomas [name] will return to the lab for a CEA today.  He will return for an office visit and CEA in 4 months.  Thomas Hof, MD  04/09/2024  11:58 AM

## 2024-04-09 NOTE — Telephone Encounter (Signed)
 Patient gave verbal understanding and had no further questions.

## 2024-04-09 NOTE — Telephone Encounter (Signed)
-----   Message from [name] sent at 04/09/2024  4:04 PM EST ----- Please call patient, the CEA is normal, follow-up as scheduled  ----- Message ----- From: Rebecka, Lab In [place] Sent: 04/09/2024   1:35 PM EST To: Arley KATHEE Hof, MD

## 2024-04-10 ENCOUNTER — Other Ambulatory Visit: Payer: Self-pay | Admitting: Interventional Radiology

## 2024-04-10 DIAGNOSIS — R160 Hepatomegaly, not elsewhere classified: Secondary | ICD-10-CM

## 2024-05-19 ENCOUNTER — Inpatient Hospital Stay
Admission: RE | Admit: 2024-05-19 | Discharge: 2024-05-19 | Disposition: A | Source: Ambulatory Visit | Attending: Interventional Radiology

## 2024-05-19 DIAGNOSIS — R160 Hepatomegaly, not elsewhere classified: Secondary | ICD-10-CM

## 2024-05-19 MED ORDER — GADOPICLENOL 0.5 MMOL/ML IV SOLN
10.0000 mL | Freq: Once | INTRAVENOUS | Status: AC | PRN
  Administered 2024-05-19: 10 mL via INTRAVENOUS

## 2024-05-22 ENCOUNTER — Encounter: Payer: Self-pay | Admitting: Diagnostic Radiology

## 2024-05-31 ENCOUNTER — Ambulatory Visit

## 2024-05-31 ENCOUNTER — Ambulatory Visit (HOSPITAL_COMMUNITY)

## 2024-07-12 ENCOUNTER — Ambulatory Visit (HOSPITAL_COMMUNITY)

## 2024-07-12 ENCOUNTER — Ambulatory Visit

## 2024-08-13 ENCOUNTER — Inpatient Hospital Stay: Admitting: Oncology

## 2024-08-13 ENCOUNTER — Inpatient Hospital Stay
